# Patient Record
Sex: Male | Born: 1945 | Race: White | Hispanic: No | Marital: Married | Smoking: Former smoker
Health system: Southern US, Community
[De-identification: ages and names within clinical notes are randomized; demographics above are authoritative.]

## PROBLEM LIST (undated history)

## (undated) DIAGNOSIS — I700 Atherosclerosis of aorta: Secondary | ICD-10-CM

## (undated) DIAGNOSIS — F411 Generalized anxiety disorder: Secondary | ICD-10-CM

## (undated) DIAGNOSIS — J189 Pneumonia, unspecified organism: Secondary | ICD-10-CM

## (undated) DIAGNOSIS — M810 Age-related osteoporosis without current pathological fracture: Secondary | ICD-10-CM

## (undated) DIAGNOSIS — K219 Gastro-esophageal reflux disease without esophagitis: Secondary | ICD-10-CM

## (undated) DIAGNOSIS — E119 Type 2 diabetes mellitus without complications: Secondary | ICD-10-CM

## (undated) DIAGNOSIS — I4891 Unspecified atrial fibrillation: Secondary | ICD-10-CM

## (undated) DIAGNOSIS — R972 Elevated prostate specific antigen [PSA]: Secondary | ICD-10-CM

## (undated) DIAGNOSIS — D649 Anemia, unspecified: Secondary | ICD-10-CM

## (undated) DIAGNOSIS — I10 Essential (primary) hypertension: Secondary | ICD-10-CM

## (undated) DIAGNOSIS — M549 Dorsalgia, unspecified: Secondary | ICD-10-CM

## (undated) DIAGNOSIS — E7800 Pure hypercholesterolemia, unspecified: Secondary | ICD-10-CM

## (undated) DIAGNOSIS — R0902 Hypoxemia: Secondary | ICD-10-CM

## (undated) DIAGNOSIS — J449 Chronic obstructive pulmonary disease, unspecified: Secondary | ICD-10-CM

## (undated) DIAGNOSIS — R7301 Impaired fasting glucose: Secondary | ICD-10-CM

## (undated) HISTORY — PX: SKIN SURGERY: SHX2413

## (undated) HISTORY — DX: Unspecified atrial fibrillation: I48.91

## (undated) HISTORY — DX: Atherosclerosis of aorta: I70.0

## (undated) HISTORY — PX: CATARACT EXTRACTION: SUR2

## (undated) HISTORY — DX: Type 2 diabetes mellitus without complications: E11.9

## (undated) HISTORY — DX: Age-related osteoporosis without current pathological fracture: M81.0

## (undated) HISTORY — DX: Dorsalgia, unspecified: M54.9

## (undated) HISTORY — DX: Pure hypercholesterolemia, unspecified: E78.00

## (undated) HISTORY — DX: Essential (primary) hypertension: I10

## (undated) HISTORY — DX: Anemia, unspecified: D64.9

## (undated) HISTORY — DX: Hypoxemia: R09.02

## (undated) HISTORY — DX: Elevated prostate specific antigen (PSA): R97.20

## (undated) HISTORY — DX: Generalized anxiety disorder: F41.1

## (undated) HISTORY — DX: Chronic obstructive pulmonary disease, unspecified: J44.9

---
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
## 2004-09-17 ENCOUNTER — Inpatient Hospital Stay (HOSPITAL_COMMUNITY): Admission: AD | Admit: 2004-09-17 | Discharge: 2004-09-20 | Payer: Self-pay | Admitting: Orthopedic Surgery

## 2012-02-04 ENCOUNTER — Ambulatory Visit (HOSPITAL_BASED_OUTPATIENT_CLINIC_OR_DEPARTMENT_OTHER)
Admission: RE | Admit: 2012-02-04 | Discharge: 2012-02-04 | Disposition: A | Discharge status: Home / Self Care | Payer: Medicare Other | Source: Ambulatory Visit | Attending: Critical Care Medicine | Admitting: Critical Care Medicine

## 2012-02-04 ENCOUNTER — Ambulatory Visit (INDEPENDENT_AMBULATORY_CARE_PROVIDER_SITE_OTHER): Payer: Medicare Other | Admitting: Critical Care Medicine

## 2012-02-04 ENCOUNTER — Encounter: Payer: Self-pay | Admitting: Critical Care Medicine

## 2012-02-04 DIAGNOSIS — M949 Disorder of cartilage, unspecified: Secondary | ICD-10-CM | POA: Insufficient documentation

## 2012-02-04 DIAGNOSIS — Z87891 Personal history of nicotine dependence: Secondary | ICD-10-CM

## 2012-02-04 DIAGNOSIS — J449 Chronic obstructive pulmonary disease, unspecified: Secondary | ICD-10-CM | POA: Insufficient documentation

## 2012-02-04 DIAGNOSIS — J4489 Other specified chronic obstructive pulmonary disease: Secondary | ICD-10-CM | POA: Insufficient documentation

## 2012-02-04 DIAGNOSIS — J189 Pneumonia, unspecified organism: Secondary | ICD-10-CM

## 2012-02-04 DIAGNOSIS — M899 Disorder of bone, unspecified: Secondary | ICD-10-CM | POA: Insufficient documentation

## 2012-02-04 DIAGNOSIS — R918 Other nonspecific abnormal finding of lung field: Secondary | ICD-10-CM

## 2012-02-04 DIAGNOSIS — J9819 Other pulmonary collapse: Secondary | ICD-10-CM

## 2012-02-04 DIAGNOSIS — Z09 Encounter for follow-up examination after completed treatment for conditions other than malignant neoplasm: Secondary | ICD-10-CM | POA: Insufficient documentation

## 2012-02-04 DIAGNOSIS — E7800 Pure hypercholesterolemia, unspecified: Secondary | ICD-10-CM

## 2012-02-04 MED ORDER — TIOTROPIUM BROMIDE MONOHYDRATE 18 MCG IN CAPS: 18.0000 ug | ORAL_CAPSULE | Freq: Every day | RESPIRATORY_TRACT | Status: DC

## 2012-02-04 MED ORDER — ALBUTEROL SULFATE HFA 108 (90 BASE) MCG/ACT IN AERS: 2.0000 | INHALATION_SPRAY | RESPIRATORY_TRACT | Status: DC | PRN

## 2012-02-04 NOTE — Progress Notes (Signed)
Subjective:    Patient ID: Paul [name], male    DOB: 30-Nov-1946, 66 y.o.   MRN: [id number]  HPI Comments: Copd Dx x 47yrs.   Recently Dx PNA one week ago at [facility].  Rx ABX. Now symptoms are better  Shortness of Breath This is a chronic problem. The current episode started more than 1 year ago (three year hx, has been active in the past ). The problem occurs daily (dyspneic at rest and exertion). The problem has been gradually worsening. Associated symptoms include a fever and a sore throat. Pertinent negatives include no abdominal pain, chest pain, ear pain, headaches, hemoptysis, leg pain, leg swelling, neck pain, orthopnea, PND, rash, rhinorrhea, vomiting or wheezing. The symptoms are aggravated by any activity, eating, weather changes, URIs and smoke. Associated symptoms comments: Temp to 102 now is better . Risk factors include smoking. He has tried beta agonist inhalers and steroid inhalers for the symptoms. The treatment provided moderate relief. His past medical history is significant for COPD and pneumonia. There is no history of allergies, asthma, bronchiolitis, CAD, chronic lung disease, DVT, a heart failure or PE.   Past Medical History  Diagnosis Date  . High cholesterol   . COPD (chronic obstructive pulmonary disease)      Family History  Problem Relation Age of Onset  . Heart disease Brother      History   Social History  . Marital Status: Married    Spouse Name: N/A    Number of Children: 3  . Years of Education: N/A   Occupational History  . retired     [employer]al architect   Social History Main Topics  . Smoking status: Former Smoker -- 2.0 packs/day for 52 years    Types: Cigarettes    Quit date: 02/03/2009  . Smokeless tobacco: Never Used  . Alcohol Use: Yes  . Drug Use: No  . Sexually Active: Not on file   Other Topics Concern  . Not on file   Social History Narrative  . No narrative on file     No Known Allergies   No outpatient  prescriptions prior to visit.       Review of Systems  Constitutional: Positive for fever, chills and appetite change. Negative for diaphoresis, activity change, fatigue and unexpected weight change.  HENT: Positive for congestion and sore throat. Negative for hearing loss, ear pain, nosebleeds, facial swelling, rhinorrhea, sneezing, mouth sores, trouble swallowing, neck pain, neck stiffness, dental problem, voice change, postnasal drip, sinus pressure, tinnitus and ear discharge.   Eyes: Negative for photophobia, discharge, itching and visual disturbance.  Respiratory: Positive for cough and shortness of breath. Negative for apnea, hemoptysis, choking, chest tightness, wheezing and stridor.   Cardiovascular: Negative for chest pain, palpitations, orthopnea, leg swelling and PND.  Gastrointestinal: Positive for nausea and constipation. Negative for vomiting, abdominal pain, blood in stool and abdominal distention.  Genitourinary: Negative for dysuria, urgency, frequency, hematuria, flank pain, decreased urine volume and difficulty urinating.  Musculoskeletal: Positive for back pain. Negative for myalgias, joint swelling, arthralgias and gait problem.  Skin: Negative for color change, pallor and rash.  Neurological: Negative for dizziness, tremors, seizures, syncope, speech difficulty, weakness, light-headedness, numbness and headaches.  Hematological: Negative for adenopathy. Bruises/bleeds easily.  Psychiatric/Behavioral: Negative for confusion, sleep disturbance and agitation. The patient is not nervous/anxious.        Objective:   Physical Exam Filed Vitals:   02/04/12 1111  BP: 174/82  Pulse: 106  Temp: 98.3  F (36.8 C)  TempSrc: Oral  Height: 5\' 5"  (1.651 m)  Weight: 142 lb (64.411 kg)  SpO2: 91%    Gen: Pleasant, well-nourished, in no distress,  normal affect  ENT: No lesions,  mouth clear,  oropharynx clear, no postnasal drip  Neck: No JVD, no TMG, no carotid  bruits  Lungs: No use of accessory muscles, no dullness to percussion,  Distant BS  Cardiovascular: RRR, heart sounds normal, no murmur or gallops, no peripheral edema  Abdomen: soft and NT, no HSM,  BS normal  Musculoskeletal: No deformities, no cyanosis or clubbing  Neuro: alert, non focal  Skin: Warm, no lesions or rashes  Dg Chest 2 View  02/04/2012  *RADIOLOGY REPORT*  Clinical Data: History of pneumonia.  Follow-up.  History of COPD. History of ex-smoker.  CHEST - 2 VIEW  Comparison: 06/19/2010.  Findings: Cardiac silhouette is normal size and shape.  It is stable.  There is generalized hyperinflation configuration with flattening of the diaphragm on lateral image consistent with COPD. Patchy reticulonodular infiltrative densities are seen in the bases more on the left than the right.  There is slight blunting of each costophrenic angle left greater than right representing pleural thickening versus a small amount of pleural effusion.  There is also minimal basilar atelectasis.  Pleural effusion is favored because on lateral image there is density in the lower portion of the major fissure most likely reflecting fluid within the fissure. Apical pleural thickening and fibrotic changes bilaterally.  There is central peribronchial thickening.  There is osteopenic appearance of bones.  There is degenerative spondylosis. Mediastinal and hilar contours appear stable.  Nonaneurysmal aortic calcifications are present.  IMPRESSION: Hyperinflation consistent with COPD.  Bibasilar atelectasis and infiltrative densities more on the left than the right. This could reflect progression of chronic disease or reflect element of acute superimposed pneumonia or pneumonitis.  No consolidation is evident.  Pleural thickening versus small pleural effusions bilaterally, left greater than right. Density in major fissure on lateral image most likely reflects some fluid within the fissure.  Original Report Authenticated  By: Crawford Givens, M.D.    02/04/12 Cleda Daub: FEV1 36% FVC 57% FEF 25 75    11%      Assessment & Plan:   COPD (chronic obstructive pulmonary disease) Severe chronic obstructive lung disease golds stage III Chest xray no active disease  Plan Add Spiriva to symbicort Work on [employer] inhaler technique Finish Levaquin Overnight Sleep oxygen test  A referral to pulmonary rehab will be made Return 6 weeks for recheck [facility] office    Updated Medication List Outpatient Encounter Prescriptions as of 02/04/2012  Medication Sig Dispense Refill  . albuterol (PROVENTIL HFA;VENTOLIN HFA) 108 (90 BASE) MCG/ACT inhaler Inhale 2 puffs into the lungs every 4 (four) hours as needed for wheezing or shortness of breath.      . Ascorbic Acid (VITAMIN C PO) Take by mouth daily.      . budesonide-formoterol (SYMBICORT) 160-4.5 MCG/ACT inhaler Inhale 2 puffs into the lungs 2 (two) times daily.      . fish oil-omega-3 fatty acids 1000 MG capsule Take 2 g by mouth daily.      Marland Kitchen levofloxacin (LEVAQUIN) 750 MG tablet Daily.      . simvastatin (ZOCOR) 40 MG tablet Take 20 mg by mouth every evening.      . tiotropium (SPIRIVA HANDIHALER) 18 MCG inhalation capsule Place 1 capsule (18 mcg total) into inhaler and inhale daily.  30 capsule  6  .  DISCONTD: albuterol (PROVENTIL HFA;VENTOLIN HFA) 108 (90 BASE) MCG/ACT inhaler Inhale 2 puffs into the lungs 4 (four) times daily.      Marland Kitchen DISCONTD: tiotropium (SPIRIVA HANDIHALER) 18 MCG inhalation capsule Place 1 capsule (18 mcg total) into inhaler and inhale daily.  30 capsule  2

## 2012-02-04 NOTE — Patient Instructions (Addendum)
Add Spiriva to symbicort Work on [employer] inhaler technique Finish Levaquin A chest xray will be obtained today Overnight Sleep oxygen test  A referral to pulmonary rehab will be made Return 6 weeks for recheck [employer] office

## 2012-02-04 NOTE — Progress Notes (Signed)
Quick Note:  Called, spoke with pt.  I informed him xray stable, no pna per PW and advised to continue his current meds as prescribed at last OV. He verbalized understanding of these results and recs and voiced no further questions/concerns at this time. ______

## 2012-02-04 NOTE — Progress Notes (Signed)
Quick Note:  Notify the patient that the Xray is stable and no pneumonia No change in medications are recommended. Continue current meds as prescribed at last office visit ______ 

## 2012-02-05 NOTE — Assessment & Plan Note (Addendum)
Severe chronic obstructive lung disease golds stage III Chest xray no active disease  Plan Add Spiriva to symbicort Work on [employer] inhaler technique Finish Levaquin Overnight Sleep oxygen test  A referral to pulmonary rehab will be made Return 6 weeks for recheck [employer] office

## 2012-02-12 ENCOUNTER — Telehealth: Payer: Self-pay | Admitting: Critical Care Medicine

## 2012-02-12 DIAGNOSIS — J449 Chronic obstructive pulmonary disease, unspecified: Secondary | ICD-10-CM

## 2012-02-12 NOTE — Telephone Encounter (Signed)
Pt needs QHS oxygen Order sent to [facility]

## 2012-02-12 NOTE — Telephone Encounter (Signed)
I have placed order please advise [facility] thanks

## 2012-02-12 NOTE — Telephone Encounter (Signed)
Needed liter flow. Libby spoke with Dr. Delford Field who advised to place o2 on 2 lpm q hs. Referral faxed to [facility] to provide home oxygen at 2 lpm Q HS.

## 2012-02-12 NOTE — Telephone Encounter (Signed)
Need order for 02 put into [facility] que thanks

## 2012-02-21 ENCOUNTER — Encounter: Payer: Self-pay | Admitting: Critical Care Medicine

## 2012-03-16 ENCOUNTER — Ambulatory Visit: Payer: Medicare Other | Admitting: Critical Care Medicine

## 2012-03-19 ENCOUNTER — Encounter: Payer: Self-pay | Admitting: Critical Care Medicine

## 2012-03-19 ENCOUNTER — Ambulatory Visit (INDEPENDENT_AMBULATORY_CARE_PROVIDER_SITE_OTHER): Payer: Medicare Other | Admitting: Critical Care Medicine

## 2012-03-19 VITALS — BP 146/80 | HR 86 | Temp 98.2°F | Ht 65.0 in | Wt 142.5 lb

## 2012-03-19 DIAGNOSIS — J449 Chronic obstructive pulmonary disease, unspecified: Secondary | ICD-10-CM

## 2012-03-19 MED ORDER — TIOTROPIUM BROMIDE MONOHYDRATE 18 MCG IN CAPS: 18.0000 ug | ORAL_CAPSULE | Freq: Every day | RESPIRATORY_TRACT | Status: DC

## 2012-03-19 NOTE — Progress Notes (Signed)
Subjective:    Patient ID: Paul [name], male    DOB: 1946-12-18, 66 y.o.   MRN: [id number]  HPI Comments: Copd Dx x 38yrs.   Recently Dx PNA one week ago at [facility].  Rx ABX. Now symptoms are better  03/19/2012 Doing better  no new issues. Patient wearing oxygen at night. Pt denies any significant sore throat, nasal congestion or excess secretions, fever, chills, sweats, unintended weight loss, pleurtic or exertional chest pain, orthopnea PND, or leg swelling Pt denies any increase in rescue therapy over baseline, denies waking up needing it or having any early am or nocturnal exacerbations of coughing/wheezing/or dyspnea. Pt also denies any obvious fluctuation in symptoms with  weather or environmental change or other alleviating or aggravating factors     Past Medical History  Diagnosis Date  . High cholesterol   . COPD (chronic obstructive pulmonary disease)      Family History  Problem Relation Age of Onset  . Heart disease Brother      History   Social History  . Marital Status: Married    Spouse Name: N/A    Number of Children: 3  . Years of Education: N/A   Occupational History  . retired     [employer] architect   Social History Main Topics  . Smoking status: Former Smoker -- 2.0 packs/day for 52 years    Types: Cigarettes    Quit date: 02/03/2009  . Smokeless tobacco: Never Used  . Alcohol Use: Yes  . Drug Use: No  . Sexually Active: Not on file   Other Topics Concern  . Not on file   Social History Narrative  . No narrative on file     No Known Allergies   Outpatient Prescriptions Prior to Visit  Medication Sig Dispense Refill  . albuterol (PROVENTIL HFA;VENTOLIN HFA) 108 (90 BASE) MCG/ACT inhaler Inhale 2 puffs into the lungs every 4 (four) hours as needed for wheezing or shortness of breath.      . Ascorbic Acid (VITAMIN C PO) Take by mouth daily.      . budesonide-formoterol (SYMBICORT) 160-4.5 MCG/ACT inhaler Inhale 2 puffs into the  lungs 2 (two) times daily.      . fish oil-omega-3 fatty acids 1000 MG capsule Take 2 g by mouth daily.      . simvastatin (ZOCOR) 40 MG tablet Take 20 mg by mouth every evening.      . tiotropium (SPIRIVA HANDIHALER) 18 MCG inhalation capsule Place 1 capsule (18 mcg total) into inhaler and inhale daily.  30 capsule  6       Review of Systems  Constitutional: Positive for chills and appetite change. Negative for diaphoresis, activity change, fatigue and unexpected weight change.  HENT: Positive for congestion. Negative for hearing loss, nosebleeds, facial swelling, sneezing, mouth sores, trouble swallowing, neck stiffness, dental problem, voice change, postnasal drip, sinus pressure, tinnitus and ear discharge.   Eyes: Negative for photophobia, discharge, itching and visual disturbance.  Respiratory: Positive for cough. Negative for apnea, choking, chest tightness and stridor.   Cardiovascular: Negative for palpitations.  Gastrointestinal: Positive for nausea and constipation. Negative for blood in stool and abdominal distention.  Genitourinary: Negative for dysuria, urgency, frequency, hematuria, flank pain, decreased urine volume and difficulty urinating.  Musculoskeletal: Positive for back pain. Negative for myalgias, joint swelling, arthralgias and gait problem.  Skin: Negative for color change and pallor.  Neurological: Negative for dizziness, tremors, seizures, syncope, speech difficulty, weakness, light-headedness and numbness.  Hematological:  Negative for adenopathy. Bruises/bleeds easily.  Psychiatric/Behavioral: Negative for confusion, sleep disturbance and agitation. The patient is not nervous/anxious.        Objective:   Physical Exam  Filed Vitals:   03/19/12 1353  BP: 146/80  Pulse: 86  Temp: 98.2 F (36.8 C)  TempSrc: Oral  Height: 5\' 5"  (1.651 m)  Weight: 142 lb 8 oz (64.638 kg)  SpO2: 95%    Gen: Pleasant, well-nourished, in no distress,  normal affect  ENT:  No lesions,  mouth clear,  oropharynx clear, no postnasal drip  Neck: No JVD, no TMG, no carotid bruits  Lungs: No use of accessory muscles, no dullness to percussion,  Distant BS  Cardiovascular: RRR, heart sounds normal, no murmur or gallops, no peripheral edema  Abdomen: soft and NT, no HSM,  BS normal  Musculoskeletal: No deformities, no cyanosis or clubbing  Neuro: alert, non focal  Skin: Warm, no lesions or rashes  No results found.  02/04/12 Spiro: FEV1 36% FVC 57% FEF 25 75    11%      Assessment & Plan:   COPD (chronic obstructive pulmonary disease) Severe chronic obstructive lung disease golds stage III   improved with Spiriva therapy Plan Continue oxygen at night Continue Advair and Spiriva Return 4 months     Updated Medication List Outpatient Encounter Prescriptions as of 03/19/2012  Medication Sig Dispense Refill  . albuterol (PROVENTIL HFA;VENTOLIN HFA) 108 (90 BASE) MCG/ACT inhaler Inhale 2 puffs into the lungs every 4 (four) hours as needed for wheezing or shortness of breath.      . Ascorbic Acid (VITAMIN C PO) Take by mouth daily.      . budesonide-formoterol (SYMBICORT) 160-4.5 MCG/ACT inhaler Inhale 2 puffs into the lungs 2 (two) times daily.      . fish oil-omega-3 fatty acids 1000 MG capsule Take 2 g by mouth daily.      . simvastatin (ZOCOR) 40 MG tablet Take 20 mg by mouth every evening.      . tiotropium (SPIRIVA HANDIHALER) 18 MCG inhalation capsule Place 1 capsule (18 mcg total) into inhaler and inhale daily.  90 capsule  6  . DISCONTD: tiotropium (SPIRIVA HANDIHALER) 18 MCG inhalation capsule Place 1 capsule (18 mcg total) into inhaler and inhale daily.  30 capsule  6

## 2012-03-19 NOTE — Patient Instructions (Signed)
No change in medications. Return in         4 months 

## 2012-03-19 NOTE — Assessment & Plan Note (Addendum)
Severe chronic obstructive lung disease golds stage III   improved with Spiriva therapy Plan Continue oxygen at night Continue Advair and Spiriva Return 4 months

## 2012-07-23 ENCOUNTER — Ambulatory Visit (INDEPENDENT_AMBULATORY_CARE_PROVIDER_SITE_OTHER): Payer: Medicare Other | Admitting: Critical Care Medicine

## 2012-07-23 ENCOUNTER — Encounter: Payer: Self-pay | Admitting: Critical Care Medicine

## 2012-07-23 VITALS — BP 122/66 | HR 80 | Temp 98.5°F | Ht 66.0 in | Wt 148.0 lb

## 2012-07-23 DIAGNOSIS — J449 Chronic obstructive pulmonary disease, unspecified: Secondary | ICD-10-CM

## 2012-07-23 NOTE — Progress Notes (Signed)
Subjective:    Patient ID: Paul [name], male    DOB: 04-Oct-1946, 66 y.o.   MRN: [id number]  HPI 03/19/2012 Doing better  no new issues. Patient wearing oxygen at night. Pt denies any significant sore throat, nasal congestion or excess secretions, fever, chills, sweats, unintended weight loss, pleurtic or exertional chest pain, orthopnea PND, or leg swelling Pt denies any increase in rescue therapy over baseline, denies waking up needing it or having any early am or nocturnal exacerbations of coughing/wheezing/or dyspnea. Pt also denies any obvious fluctuation in symptoms with  weather or environmental change or other alleviating or aggravating factors  07/23/2012 On symbicort and spiriva and doing well.  Min cough. Dyspnea is stable Pt denies any significant sore throat, nasal congestion or excess secretions, fever, chills, sweats, unintended weight loss, pleurtic or exertional chest pain, orthopnea PND, or leg swelling Pt denies any increase in rescue therapy over baseline, denies waking up needing it or having any early am or nocturnal exacerbations of coughing/wheezing/or dyspnea. Pt also denies any obvious fluctuation in symptoms with  weather or environmental change or other alleviating or aggravating factors     Past Medical History  Diagnosis Date  . High cholesterol   . COPD (chronic obstructive pulmonary disease)      Family History  Problem Relation Age of Onset  . Heart disease Brother      History   Social History  . Marital Status: Married    Spouse Name: N/A    Number of Children: 3  . Years of Education: N/A   Occupational History  . retired     [employer] architect   Social History Main Topics  . Smoking status: Former Smoker -- 2.0 packs/day for 52 years    Types: Cigarettes    Quit date: 02/03/2009  . Smokeless tobacco: Never Used  . Alcohol Use: Yes  . Drug Use: No  . Sexually Active: Not on file   Other Topics Concern  . Not on file   Social History  Narrative  . No narrative on file     No Known Allergies   Outpatient Prescriptions Prior to Visit  Medication Sig Dispense Refill  . albuterol (PROVENTIL HFA;VENTOLIN HFA) 108 (90 BASE) MCG/ACT inhaler Inhale 2 puffs into the lungs every 4 (four) hours as needed for wheezing or shortness of breath.      . Ascorbic Acid (VITAMIN C PO) Take by mouth daily.      . budesonide-formoterol (SYMBICORT) 160-4.5 MCG/ACT inhaler Inhale 2 puffs into the lungs 2 (two) times daily.      . fish oil-omega-3 fatty acids 1000 MG capsule Take 2 g by mouth daily.      . simvastatin (ZOCOR) 40 MG tablet Take 20 mg by mouth every evening.      . tiotropium (SPIRIVA HANDIHALER) 18 MCG inhalation capsule Place 1 capsule (18 mcg total) into inhaler and inhale daily.  90 capsule  6       Review of Systems  Constitutional: Positive for chills and appetite change. Negative for diaphoresis, activity change, fatigue and unexpected weight change.  HENT: Positive for congestion. Negative for hearing loss, nosebleeds, facial swelling, sneezing, mouth sores, trouble swallowing, neck stiffness, dental problem, voice change, postnasal drip, sinus pressure, tinnitus and ear discharge.   Eyes: Negative for photophobia, discharge, itching and visual disturbance.  Respiratory: Positive for cough. Negative for apnea, choking, chest tightness and stridor.   Cardiovascular: Negative for palpitations.  Gastrointestinal: Positive for nausea and constipation. Negative  for blood in stool and abdominal distention.  Genitourinary: Negative for dysuria, urgency, frequency, hematuria, flank pain, decreased urine volume and difficulty urinating.  Musculoskeletal: Positive for back pain. Negative for myalgias, joint swelling, arthralgias and gait problem.  Skin: Negative for color change and pallor.  Neurological: Negative for dizziness, tremors, seizures, syncope, speech difficulty, weakness, light-headedness and numbness.    Hematological: Negative for adenopathy. Bruises/bleeds easily.  Psychiatric/Behavioral: Negative for confusion, disturbed wake/sleep cycle and agitation. The patient is not nervous/anxious.        Objective:   Physical Exam  Filed Vitals:   07/23/12 1438  BP: 122/66  Pulse: 80  Temp: 98.5 F (36.9 C)  TempSrc: Oral  Height: 5\' 6"  (1.676 m)  Weight: 148 lb (67.132 kg)  SpO2: 96%    Gen: Pleasant, well-nourished, in no distress,  normal affect  ENT: No lesions,  mouth clear,  oropharynx clear, no postnasal drip  Neck: No JVD, no TMG, no carotid bruits  Lungs: No use of accessory muscles, no dullness to percussion,  Distant BS  Cardiovascular: RRR, heart sounds normal, no murmur or gallops, no peripheral edema  Abdomen: soft and NT, no HSM,  BS normal  Musculoskeletal: No deformities, no cyanosis or clubbing  Neuro: alert, non focal  Skin: Warm, no lesions or rashes  No results found.  02/04/12 Spiro: FEV1 36% FVC 57% FEF 25 75    11%      Assessment & Plan:   COPD (chronic obstructive pulmonary disease) Chronic obstructive lung disease gold stage C. COPD stable at this time Plan Maintain inhaled medications Spiriva and Symbicort as prescribed Maintain oxygen 2 L at bedtime Return 6    Updated Medication List Outpatient Encounter Prescriptions as of 07/23/2012  Medication Sig Dispense Refill  . albuterol (PROVENTIL HFA;VENTOLIN HFA) 108 (90 BASE) MCG/ACT inhaler Inhale 2 puffs into the lungs every 4 (four) hours as needed for wheezing or shortness of breath.      . Ascorbic Acid (VITAMIN C PO) Take by mouth daily.      . budesonide-formoterol (SYMBICORT) 160-4.5 MCG/ACT inhaler Inhale 2 puffs into the lungs 2 (two) times daily.      . fish oil-omega-3 fatty acids 1000 MG capsule Take 2 g by mouth daily.      . Multiple Vitamins-Minerals (MULTIVITAMIN PO) Take by mouth daily.      . simvastatin (ZOCOR) 40 MG tablet Take 20 mg by mouth every evening.      .  tiotropium (SPIRIVA HANDIHALER) 18 MCG inhalation capsule Place 1 capsule (18 mcg total) into inhaler and inhale daily.  90 capsule  6

## 2012-07-23 NOTE — Assessment & Plan Note (Signed)
Chronic obstructive lung disease gold stage C. COPD stable at this time Plan Maintain inhaled medications Spiriva and Symbicort as prescribed Maintain oxygen 2 L at bedtime Return 6

## 2012-07-23 NOTE — Patient Instructions (Signed)
No change in medications. Return in        6 months        

## 2013-01-07 ENCOUNTER — Encounter: Payer: Self-pay | Admitting: Critical Care Medicine

## 2013-01-07 ENCOUNTER — Ambulatory Visit (INDEPENDENT_AMBULATORY_CARE_PROVIDER_SITE_OTHER): Payer: Medicare Other | Admitting: Critical Care Medicine

## 2013-01-07 VITALS — BP 130/80 | HR 80 | Temp 98.0°F | Wt 145.0 lb

## 2013-01-07 DIAGNOSIS — J438 Other emphysema: Secondary | ICD-10-CM

## 2013-01-07 DIAGNOSIS — J449 Chronic obstructive pulmonary disease, unspecified: Secondary | ICD-10-CM

## 2013-01-07 DIAGNOSIS — J439 Emphysema, unspecified: Secondary | ICD-10-CM

## 2013-01-07 MED ORDER — TIOTROPIUM BROMIDE MONOHYDRATE 18 MCG IN CAPS: 18.0000 ug | ORAL_CAPSULE | Freq: Every day | RESPIRATORY_TRACT | Status: DC

## 2013-01-07 MED ORDER — BUDESONIDE-FORMOTEROL FUMARATE 160-4.5 MCG/ACT IN AERO: 2.0000 | INHALATION_SPRAY | Freq: Two times a day (BID) | RESPIRATORY_TRACT | Status: DC

## 2013-01-07 NOTE — Progress Notes (Signed)
Subjective:    Patient ID: Paul [name], male    DOB: October 23, 1946, 67 y.o.   MRN: [id number]  HPI  03/19/2012 Doing better  no new issues. Patient wearing oxygen at night. Pt denies any significant sore throat, nasal congestion or excess secretions, fever, chills, sweats, unintended weight loss, pleurtic or exertional chest pain, orthopnea PND, or leg swelling Pt denies any increase in rescue therapy over baseline, denies waking up needing it or having any early am or nocturnal exacerbations of coughing/wheezing/or dyspnea. Pt also denies any obvious fluctuation in symptoms with  weather or environmental change or other alleviating or aggravating factors  07/23/12 On symbicort and spiriva and doing well.  Min cough. Dyspnea is stable Pt denies any significant sore throat, nasal congestion or excess secretions, fever, chills, sweats, unintended weight loss, pleurtic or exertional chest pain, orthopnea PND, or leg swelling Pt denies any increase in rescue therapy over baseline, denies waking up needing it or having any early am or nocturnal exacerbations of coughing/wheezing/or dyspnea. Pt also denies any obvious fluctuation in symptoms with  weather or environmental change or other alleviating or aggravating factors  01/07/2013 No real changes.  Dyspnea ok.  No qhs dyspnea.  Pt can walk and play golf.  N oreal chest pain Min mucus Pt denies any significant sore throat, nasal congestion or excess secretions, fever, chills, sweats, unintended weight loss, pleurtic or exertional chest pain, orthopnea PND, or leg swelling Pt denies any increase in rescue therapy over baseline, denies waking up needing it or having any early am or nocturnal exacerbations of coughing/wheezing/or dyspnea. Pt also denies any obvious fluctuation in symptoms with  weather or environmental change or other alleviating or aggravating factors      Past Medical History  Diagnosis Date  . High cholesterol   . COPD (chronic  obstructive pulmonary disease)      Family History  Problem Relation Age of Onset  . Heart disease Brother      History   Social History  . Marital Status: Married    Spouse Name: N/A    Number of Children: 3  . Years of Education: N/A   Occupational History  . retired     [employer]al architect   Social History Main Topics  . Smoking status: Former Smoker -- 2.0 packs/day for 52 years    Types: Cigarettes    Quit date: 02/03/2009  . Smokeless tobacco: Never Used  . Alcohol Use: Yes  . Drug Use: No  . Sexually Active: Not on file   Other Topics Concern  . Not on file   Social History Narrative  . No narrative on file     No Known Allergies   Outpatient Prescriptions Prior to Visit  Medication Sig Dispense Refill  . albuterol (PROVENTIL HFA;VENTOLIN HFA) 108 (90 BASE) MCG/ACT inhaler Inhale 2 puffs into the lungs every 4 (four) hours as needed for wheezing or shortness of breath.      . Ascorbic Acid (VITAMIN C PO) Take by mouth daily.      . fish oil-omega-3 fatty acids 1000 MG capsule Take 2 g by mouth daily.      . Multiple Vitamins-Minerals (MULTIVITAMIN PO) Take by mouth daily.      . simvastatin (ZOCOR) 40 MG tablet Take 20 mg by mouth every evening.      . [DISCONTINUED] budesonide-formoterol (SYMBICORT) 160-4.5 MCG/ACT inhaler Inhale 2 puffs into the lungs 2 (two) times daily.      . [DISCONTINUED] tiotropium (SPIRIVA HANDIHALER)  18 MCG inhalation capsule Place 1 capsule (18 mcg total) into inhaler and inhale daily.  90 capsule  6  Last reviewed on 01/07/2013 11:18 AM by Storm Frisk, MD     Review of Systems  Constitutional: Positive for chills and appetite change. Negative for diaphoresis, activity change, fatigue and unexpected weight change.  HENT: Positive for congestion. Negative for hearing loss, nosebleeds, facial swelling, sneezing, mouth sores, trouble swallowing, neck stiffness, dental problem, voice change, postnasal drip, sinus pressure, tinnitus and  ear discharge.   Eyes: Negative for photophobia, discharge, itching and visual disturbance.  Respiratory: Positive for cough. Negative for apnea, choking, chest tightness and stridor.   Cardiovascular: Negative for palpitations.  Gastrointestinal: Positive for nausea and constipation. Negative for blood in stool and abdominal distention.  Genitourinary: Negative for dysuria, urgency, frequency, hematuria, flank pain, decreased urine volume and difficulty urinating.  Musculoskeletal: Positive for back pain. Negative for myalgias, joint swelling, arthralgias and gait problem.  Skin: Negative for color change and pallor.  Neurological: Negative for dizziness, tremors, seizures, syncope, speech difficulty, weakness, light-headedness and numbness.  Hematological: Negative for adenopathy. Bruises/bleeds easily.  Psychiatric/Behavioral: Negative for confusion, sleep disturbance and agitation. The patient is not nervous/anxious.        Objective:   Physical Exam  Filed Vitals:   01/07/13 1100  BP: 130/80  Pulse: 80  Temp: 98 F (36.7 C)  Weight: 145 lb (65.772 kg)  SpO2: 95%    Gen: Pleasant, well-nourished, in no distress,  normal affect  ENT: No lesions,  mouth clear,  oropharynx clear, no postnasal drip  Neck: No JVD, no TMG, no carotid bruits  Lungs: No use of accessory muscles, no dullness to percussion,  Distant BS  Cardiovascular: RRR, heart sounds normal, no murmur or gallops, no peripheral edema  Abdomen: soft and NT, no HSM,  BS normal  Musculoskeletal: No deformities, no cyanosis or clubbing  Neuro: alert, non focal  Skin: Warm, no lesions or rashes  No results found.  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 COPD stable at this time The patient may now improved enough that we'll he will no longer need nocturnal oxygen therapy Plan Recheck overnight sleep oximetry  on room air Maintain inhaled medications as prescribed    Updated Medication List Outpatient Encounter Prescriptions as of 01/07/2013  Medication Sig Dispense Refill  . albuterol (PROVENTIL HFA;VENTOLIN HFA) 108 (90 BASE) MCG/ACT inhaler Inhale 2 puffs into the lungs every 4 (four) hours as needed for wheezing or shortness of breath.      . Ascorbic Acid (VITAMIN C PO) Take by mouth daily.      . budesonide-formoterol (SYMBICORT) 160-4.5 MCG/ACT inhaler Inhale 2 puffs into the lungs 2 (two) times daily.  2 Inhaler  0  . fish oil-omega-3 fatty acids 1000 MG capsule Take 2 g by mouth daily.      . Multiple Vitamins-Minerals (MULTIVITAMIN PO) Take by mouth daily.      . simvastatin (ZOCOR) 40 MG tablet Take 20 mg by mouth every evening.      . tiotropium (SPIRIVA HANDIHALER) 18 MCG inhalation capsule Place 1 capsule (18 mcg total) into inhaler and inhale daily.  20 capsule  0  . [DISCONTINUED] budesonide-formoterol (SYMBICORT) 160-4.5 MCG/ACT inhaler Inhale 2 puffs into the lungs 2 (two) times daily.      . [DISCONTINUED] tiotropium (SPIRIVA  HANDIHALER) 18 MCG inhalation capsule Place 1 capsule (18 mcg total) into inhaler and inhale daily.  90 capsule  6

## 2013-01-07 NOTE — Patient Instructions (Addendum)
No change in inhalers An overnight sleep oxygen test will be obtained on Room air at your convenience.  Return 6 months

## 2013-01-08 NOTE — Assessment & Plan Note (Signed)
Gold stage C. COPD stable at this time The patient may now improved enough that we'll he will no longer need nocturnal oxygen therapy Plan Recheck overnight sleep oximetry on room air Maintain inhaled medications as prescribed

## 2013-01-27 ENCOUNTER — Encounter: Payer: Self-pay | Admitting: Pulmonary Disease

## 2013-01-27 ENCOUNTER — Ambulatory Visit (INDEPENDENT_AMBULATORY_CARE_PROVIDER_SITE_OTHER): Payer: Medicare Other | Admitting: Pulmonary Disease

## 2013-01-27 ENCOUNTER — Telehealth: Payer: Self-pay | Admitting: Critical Care Medicine

## 2013-01-27 VITALS — BP 120/72 | HR 94 | Temp 98.3°F | Ht 66.0 in | Wt 143.8 lb

## 2013-01-27 DIAGNOSIS — J449 Chronic obstructive pulmonary disease, unspecified: Secondary | ICD-10-CM

## 2013-01-27 DIAGNOSIS — J9611 Chronic respiratory failure with hypoxia: Secondary | ICD-10-CM

## 2013-01-27 DIAGNOSIS — J441 Chronic obstructive pulmonary disease with (acute) exacerbation: Secondary | ICD-10-CM | POA: Insufficient documentation

## 2013-01-27 MED ORDER — PREDNISONE 10 MG PO TABS: ORAL_TABLET | ORAL | Status: DC

## 2013-01-27 NOTE — Progress Notes (Signed)
  Subjective:    Patient ID: Paul [name], male    DOB: [birth year], 67 y.o.   MRN: [id number]  HPI Patient comes in today for an acute sick visit.  He has known COPD, and recently returned from a trip to [place].  While down there, he developed cough with increased congestion and small quantities of green mucus production.  He also had increasing shortness of breath and a low-grade fever.  Once he returned, he was treated by his primary physician with a course of doxycycline.  This resulted in decreased quantity of mucus, but he has a residual cough and his shortness of breath has not improved.   Review of Systems  Constitutional: Negative for fever and unexpected weight change.  HENT: Negative for ear pain, nosebleeds, congestion, sore throat, rhinorrhea, sneezing, trouble swallowing, dental problem, postnasal drip and sinus pressure.   Eyes: Negative for redness and itching.  Respiratory: Positive for cough and wheezing. Negative for chest tightness and shortness of breath.   Cardiovascular: Negative for palpitations and leg swelling.  Gastrointestinal: Negative for nausea and vomiting.  Genitourinary: Negative for dysuria.  Musculoskeletal: Negative for joint swelling.  Skin: Negative for rash.  Neurological: Negative for headaches.  Hematological: Does not bruise/bleed easily.  Psychiatric/Behavioral: Negative for dysphoric mood. The patient is not nervous/anxious.        Objective:   Physical Exam Well-developed male in no acute distress Nose without purulent discharge noted Oropharynx clear Neck without lymphadenopathy or thyromegaly Chest with a few basilar crackles, mild decrease in breath sounds, no active wheezing Cardiac exam with regular rate and rhythm Lower extremities without edema, cyanosis Alert and oriented, moves all 4 extremities.       Assessment & Plan:

## 2013-01-27 NOTE — Assessment & Plan Note (Signed)
I suspect the patient has a mild COPD exacerbation related to an episode of acute bronchitis.  He has been treated with a course of doxycycline with improvement in his because production, but his cough and shortness of breath have persisted.  I think he would benefit from a short course of prednisone to help with airway inflammation, and he is to let us know if he is not improving.

## 2013-01-27 NOTE — Telephone Encounter (Signed)
Call the pt and tell him his ONO is positive for desaturation to 83% on RA.  He had of low sats   He needs to stay on oxygen 2L QHS

## 2013-01-27 NOTE — Patient Instructions (Addendum)
No change in your breathing medications. Will treat with a prednisone taper to see if helps your cough and shortness of breath.  Keep followup with Dr. Delford Field, and call if you are not getting better.

## 2013-01-28 ENCOUNTER — Telehealth: Payer: Self-pay | Admitting: Pulmonary Disease

## 2013-01-28 MED ORDER — PREDNISONE 10 MG PO TABS: ORAL_TABLET | ORAL | Status: DC

## 2013-01-28 NOTE — Telephone Encounter (Signed)
Looks like prednisone was sent to [facility]. I have called cancelled this RX. I have sent new RX to [facility] for pt. [id number] x1

## 2013-01-28 NOTE — Telephone Encounter (Signed)
I spoke with spouse and is aware RX has been cancelled at [facility] and new rx sent into [facility]. Nothing further was needed

## 2013-01-28 NOTE — Telephone Encounter (Signed)
Called, spoke with pt.  Informed him of ONO on RA results and recs per Dr. Delford Field.  He verbalized understanding of results and to stay on o2 at 2 lpm qhs and voiced no further questions or concerns at this time.

## 2013-02-10 ENCOUNTER — Encounter: Payer: Self-pay | Admitting: Critical Care Medicine

## 2013-07-15 ENCOUNTER — Encounter: Payer: Self-pay | Admitting: Critical Care Medicine

## 2013-07-15 ENCOUNTER — Ambulatory Visit (INDEPENDENT_AMBULATORY_CARE_PROVIDER_SITE_OTHER): Payer: BC Managed Care – HMO | Admitting: Critical Care Medicine

## 2013-07-15 VITALS — BP 122/58 | HR 74 | Temp 97.5°F | Ht 66.0 in | Wt 143.0 lb

## 2013-07-15 DIAGNOSIS — J449 Chronic obstructive pulmonary disease, unspecified: Secondary | ICD-10-CM

## 2013-07-15 MED ORDER — BUDESONIDE-FORMOTEROL FUMARATE 160-4.5 MCG/ACT IN AERO: 2.0000 | INHALATION_SPRAY | Freq: Two times a day (BID) | RESPIRATORY_TRACT | Status: DC

## 2013-07-15 MED ORDER — TIOTROPIUM BROMIDE MONOHYDRATE 18 MCG IN CAPS: 18.0000 ug | ORAL_CAPSULE | Freq: Every day | RESPIRATORY_TRACT | Status: DC

## 2013-07-15 NOTE — Assessment & Plan Note (Signed)
Gold stage C. COPD improved lung function compared to 2013 Plan Maintain Spiriva and Symbicort

## 2013-07-15 NOTE — Patient Instructions (Signed)
No change in medications. Return in        6 months        

## 2013-07-15 NOTE — Progress Notes (Signed)
Subjective:    Patient ID: Paul [name], male    DOB: [birth year], 67 y.o.   MRN: [id number]  HPI F/U visit. Has good breathing. One flare in the last 6 month around [place], patient inhaled [place] dust and had a flare that required steroids and abx. Occational cough with white sputum, but patient denies chest tightness, wheezing, rhinorrhea, PND. Not limited in activities. Uses 2 L of oxygen at night.   Review of Systems  Constitutional: Negative for fever, chills and fatigue.  HENT: Negative for rhinorrhea, sneezing and sinus pressure.   Respiratory: Positive for cough and shortness of breath. Negative for chest tightness and wheezing.        Objective:   Physical Exam BP 122/58  Pulse 74  Temp(Src) 97.5 F (36.4 C) (Oral)  Ht 5\' 6"  (1.676 m)  Wt 143 lb (64.864 kg)  BMI 23.09 kg/m2  SpO2 95%  HEENT: Head is normocephalic and atraumatic. Extraocular muscles are intact. Pupils are equal, round, and reactive to light. Nares appeared normal. Mouth is well hydrated and without lesions. Mucous membranes are moist. Posterior pharynx clear of any exudate or lesions. NECK: Supple. No carotid bruits. No lymphadenopathy or thyromegaly. LUNGS: Clear to auscultation. HEART: Regular rate and rhythm without murmur.  ABDOMEN: Soft, nontender, and nondistended. Positive bowel sounds. No hepatosplenomegaly was noted.  EXTREMITIES: Without any cyanosis, clubbing, rash, lesions or edema.  NEUROLOGIC: Cranial nerves II through XII are grossly intact.  PSYCHIATRIC: Flat affect, but denies suicidal or homicidal ideations. SKIN: No ulceration or induration present.        Assessment & Plan:   COPD (chronic obstructive pulmonary disease), gold stage C. Gold stage C. COPD improved lung function compared to 2013 Plan Maintain Spiriva and Symbicort    Updated Medication List Outpatient Encounter Prescriptions as of 07/15/2013  Medication Sig Dispense Refill  . albuterol  (PROVENTIL HFA;VENTOLIN HFA) 108 (90 BASE) MCG/ACT inhaler Inhale 2 puffs into the lungs every 4 (four) hours as needed for wheezing or shortness of breath.      . Ascorbic Acid (VITAMIN C PO) Take by mouth daily.      . budesonide-formoterol (SYMBICORT) 160-4.5 MCG/ACT inhaler Inhale 2 puffs into the lungs 2 (two) times daily.  3 Inhaler  4  . fish oil-omega-3 fatty acids 1000 MG capsule Take 2 g by mouth daily.      . Multiple Vitamins-Minerals (MULTIVITAMIN PO) Take by mouth daily.      . simvastatin (ZOCOR) 40 MG tablet Take 20 mg by mouth every evening.      . tiotropium (SPIRIVA HANDIHALER) 18 MCG inhalation capsule Place 1 capsule (18 mcg total) into inhaler and inhale daily.  90 capsule  4  . [DISCONTINUED] budesonide-formoterol (SYMBICORT) 160-4.5 MCG/ACT inhaler Inhale 2 puffs into the lungs 2 (two) times daily.  2 Inhaler  0  . [DISCONTINUED] tiotropium (SPIRIVA HANDIHALER) 18 MCG inhalation capsule Place 1 capsule (18 mcg total) into inhaler and inhale daily.  20 capsule  0  . [DISCONTINUED] doxycycline (VIBRAMYCIN) 100 MG capsule Take 100 mg by mouth 2 (two) times daily.      . [DISCONTINUED] Phenyleph-Promethazine-Cod 5-6.25-10 MG/5ML SYRP Take 5 mLs by mouth at bedtime as needed.      . [DISCONTINUED] predniSONE (DELTASONE) 10 MG tablet Take 4 tabs for 2 days, 3 tabs for 2 days, 2 tabs for 2 days, 1 tab for 2 days then stop  20 tablet  0   No facility-administered encounter medications on file  as of 07/15/2013.

## 2013-12-20 DIAGNOSIS — Z1211 Encounter for screening for malignant neoplasm of colon: Secondary | ICD-10-CM | POA: Diagnosis not present

## 2014-01-03 DIAGNOSIS — Z79899 Other long term (current) drug therapy: Secondary | ICD-10-CM | POA: Diagnosis not present

## 2014-01-03 DIAGNOSIS — N390 Urinary tract infection, site not specified: Secondary | ICD-10-CM | POA: Diagnosis not present

## 2014-01-03 DIAGNOSIS — R7309 Other abnormal glucose: Secondary | ICD-10-CM | POA: Diagnosis not present

## 2014-01-03 DIAGNOSIS — R509 Fever, unspecified: Secondary | ICD-10-CM | POA: Diagnosis not present

## 2014-01-07 DIAGNOSIS — R7309 Other abnormal glucose: Secondary | ICD-10-CM | POA: Diagnosis not present

## 2014-01-07 DIAGNOSIS — J449 Chronic obstructive pulmonary disease, unspecified: Secondary | ICD-10-CM | POA: Diagnosis not present

## 2014-01-07 DIAGNOSIS — E7800 Pure hypercholesterolemia, unspecified: Secondary | ICD-10-CM | POA: Diagnosis not present

## 2014-01-11 ENCOUNTER — Ambulatory Visit (INDEPENDENT_AMBULATORY_CARE_PROVIDER_SITE_OTHER): Payer: Medicare Other | Admitting: Critical Care Medicine

## 2014-01-11 ENCOUNTER — Encounter: Payer: Self-pay | Admitting: Critical Care Medicine

## 2014-01-11 VITALS — BP 116/62 | HR 77 | Temp 98.2°F | Ht 66.0 in | Wt 142.0 lb

## 2014-01-11 DIAGNOSIS — J449 Chronic obstructive pulmonary disease, unspecified: Secondary | ICD-10-CM | POA: Diagnosis not present

## 2014-01-11 DIAGNOSIS — Z23 Encounter for immunization: Secondary | ICD-10-CM | POA: Diagnosis not present

## 2014-01-11 DIAGNOSIS — J4489 Other specified chronic obstructive pulmonary disease: Secondary | ICD-10-CM

## 2014-01-11 NOTE — Progress Notes (Signed)
  Subjective:    Patient ID: Paul [name], male    DOB: 15-Feb-1946, 68 y.o.   MRN: [id number]  HPI    01/11/2014 Chief Complaint  Patient presents with  . 6 month follow up    Coughing a little more with yellow mucus.  No SOB, wheezing, chest tightness/pain, or f/c/s.   Cough is more over the past 6 months.  Yellow mucus noted.  Cannot raise. No new medical issues.  Exercises daily in gym independently Pt denies any significant sore throat, nasal congestion or excess secretions, fever, chills, sweats, unintended weight loss, pleurtic or exertional chest pain, orthopnea PND, or leg swelling Pt denies any increase in rescue therapy over baseline, denies waking up needing it or having any early am or nocturnal exacerbations of coughing/wheezing/or dyspnea. Pt also denies any obvious fluctuation in symptoms with  weather or environmental change or other alleviating or aggravating factors    Review of Systems  Constitutional: Negative for fever, chills and fatigue.  HENT: Negative for rhinorrhea, sinus pressure and sneezing.   Respiratory: Positive for cough and shortness of breath. Negative for chest tightness and wheezing.        Objective:   Physical Exam  BP 116/62  Pulse 77  Temp(Src) 98.2 F (36.8 C) (Oral)  Ht 5\' 6"  (1.676 m)  Wt 142 lb (64.411 kg)  BMI 22.93 kg/m2  SpO2 95%  HEENT: Head is normocephalic and atraumatic. Extraocular muscles are intact. Pupils are equal, round, and reactive to light. Nares appeared normal. Mouth is well hydrated and without lesions. Mucous membranes are moist. Posterior pharynx clear of any exudate or lesions. NECK: Supple. No carotid bruits. No lymphadenopathy or thyromegaly. LUNGS: Distant breath sounds HEART: Regular rate and rhythm without murmur.  ABDOMEN: Soft, nontender, and nondistended. Positive bowel sounds. No hepatosplenomegaly was noted.  EXTREMITIES: Without any cyanosis, clubbing, rash, lesions or edema.  NEUROLOGIC: Cranial  nerves II through XII are grossly intact.  PSYCHIATRIC: Flat affect, but denies suicidal or homicidal ideations. SKIN: No ulceration or induration present.     Assessment & Plan:   COPD (chronic obstructive pulmonary disease), gold stage C. Chronic obstructive lung disease gold stage C. stable at this time Plan Maintain inhaled medications as prescribed Continue nocturnal oxygen therapy Administer Prevnar 13 vaccine     Updated Medication List Outpatient Encounter Prescriptions as of 01/11/2014  Medication Sig  . albuterol (PROVENTIL HFA;VENTOLIN HFA) 108 (90 BASE) MCG/ACT inhaler Inhale 2 puffs into the lungs every 4 (four) hours as needed for wheezing or shortness of breath.  . Ascorbic Acid (VITAMIN C PO) Take by mouth daily.  . budesonide-formoterol (SYMBICORT) 160-4.5 MCG/ACT inhaler Inhale 2 puffs into the lungs 2 (two) times daily.  . fish oil-omega-3 fatty acids 1000 MG capsule Take 2 g by mouth daily.  . Multiple Vitamins-Minerals (MULTIVITAMIN PO) Take by mouth daily.  . simvastatin (ZOCOR) 40 MG tablet Take 20 mg by mouth every evening.  . tiotropium (SPIRIVA HANDIHALER) 18 MCG inhalation capsule Place 1 capsule (18 mcg total) into inhaler and inhale daily.

## 2014-01-11 NOTE — Patient Instructions (Signed)
No change in medications Prevnar 13 will be ordered Return 6 months

## 2014-01-11 NOTE — Assessment & Plan Note (Signed)
Chronic obstructive lung disease gold stage C. stable at this time Plan Maintain inhaled medications as prescribed Continue nocturnal oxygen therapy Administer Prevnar 13 vaccine

## 2014-01-18 DIAGNOSIS — J449 Chronic obstructive pulmonary disease, unspecified: Secondary | ICD-10-CM | POA: Diagnosis not present

## 2014-01-21 ENCOUNTER — Telehealth: Payer: Self-pay | Admitting: Critical Care Medicine

## 2014-01-21 DIAGNOSIS — J449 Chronic obstructive pulmonary disease, unspecified: Secondary | ICD-10-CM

## 2014-01-21 NOTE — Telephone Encounter (Signed)
ONO results have come back. Per PW - pt qualifies for oxygen at night.  Pt's wife is aware of the results. States that this test was done to requalify him for nocturnal oxygen. A new order will be placed for nocturnal oxygen.

## 2014-04-12 DIAGNOSIS — Z79899 Other long term (current) drug therapy: Secondary | ICD-10-CM | POA: Diagnosis not present

## 2014-04-12 DIAGNOSIS — E7800 Pure hypercholesterolemia, unspecified: Secondary | ICD-10-CM | POA: Diagnosis not present

## 2014-04-12 DIAGNOSIS — R7309 Other abnormal glucose: Secondary | ICD-10-CM | POA: Diagnosis not present

## 2014-04-12 DIAGNOSIS — Z125 Encounter for screening for malignant neoplasm of prostate: Secondary | ICD-10-CM | POA: Diagnosis not present

## 2014-04-12 DIAGNOSIS — J449 Chronic obstructive pulmonary disease, unspecified: Secondary | ICD-10-CM | POA: Diagnosis not present

## 2014-04-29 DIAGNOSIS — H2589 Other age-related cataract: Secondary | ICD-10-CM | POA: Diagnosis not present

## 2014-05-12 DIAGNOSIS — L570 Actinic keratosis: Secondary | ICD-10-CM | POA: Diagnosis not present

## 2014-07-08 DIAGNOSIS — H2589 Other age-related cataract: Secondary | ICD-10-CM | POA: Diagnosis not present

## 2014-07-19 ENCOUNTER — Ambulatory Visit: Payer: Medicare Other | Admitting: Critical Care Medicine

## 2014-07-19 DIAGNOSIS — H268 Other specified cataract: Secondary | ICD-10-CM | POA: Diagnosis not present

## 2014-07-19 DIAGNOSIS — H2589 Other age-related cataract: Secondary | ICD-10-CM | POA: Diagnosis not present

## 2014-07-19 DIAGNOSIS — Z87891 Personal history of nicotine dependence: Secondary | ICD-10-CM | POA: Diagnosis not present

## 2014-07-19 DIAGNOSIS — J449 Chronic obstructive pulmonary disease, unspecified: Secondary | ICD-10-CM | POA: Diagnosis not present

## 2014-07-19 DIAGNOSIS — H269 Unspecified cataract: Secondary | ICD-10-CM | POA: Diagnosis not present

## 2014-07-19 DIAGNOSIS — Z79899 Other long term (current) drug therapy: Secondary | ICD-10-CM | POA: Diagnosis not present

## 2014-07-19 DIAGNOSIS — E785 Hyperlipidemia, unspecified: Secondary | ICD-10-CM | POA: Diagnosis not present

## 2014-07-26 ENCOUNTER — Ambulatory Visit (INDEPENDENT_AMBULATORY_CARE_PROVIDER_SITE_OTHER): Payer: Medicare Other | Admitting: Critical Care Medicine

## 2014-07-26 ENCOUNTER — Encounter: Payer: Self-pay | Admitting: Critical Care Medicine

## 2014-07-26 VITALS — BP 128/58 | HR 79 | Temp 97.7°F | Ht 66.0 in | Wt 138.4 lb

## 2014-07-26 DIAGNOSIS — J438 Other emphysema: Secondary | ICD-10-CM | POA: Diagnosis not present

## 2014-07-26 DIAGNOSIS — J432 Centrilobular emphysema: Secondary | ICD-10-CM

## 2014-07-26 DIAGNOSIS — J449 Chronic obstructive pulmonary disease, unspecified: Secondary | ICD-10-CM | POA: Diagnosis not present

## 2014-07-26 NOTE — Patient Instructions (Signed)
No change in medications. Return in        6 months        

## 2014-07-26 NOTE — Progress Notes (Signed)
  Subjective:    Patient ID: Paul [name], male    DOB: [birth year], 68 y.o.   MRN: [id number]  HPI  07/26/2014 Chief Complaint  Patient presents with  . Follow-up    sob same,Oxygen 2L at hs,cough-clear,occass. wheezing, denies cp or tightness  Since last ov doing well.  Pt is still dyspneic but not as bad as before. Notes min clear mucus.  occ wheezing .   No real chest pain .  Oxygen bedtime only. Exercises daily    Review of Systems  Constitutional: Negative for fever, chills and fatigue.  HENT: Negative for rhinorrhea, sinus pressure and sneezing.   Respiratory: Positive for cough and shortness of breath. Negative for chest tightness and wheezing.        Objective:   Physical Exam  BP 128/58  Pulse 79  Temp(Src) 97.7 F (36.5 C) (Oral)  Ht 5\' 6"  (1.676 m)  Wt 138 lb 6.4 oz (62.778 kg)  BMI 22.35 kg/m2  SpO2 98%  HEENT: Head is normocephalic and atraumatic. Extraocular muscles are intact. Pupils are equal, round, and reactive to light. Nares appeared normal. Mouth is well hydrated and without lesions. Mucous membranes are moist. Posterior pharynx clear of any exudate or lesions. NECK: Supple. No carotid bruits. No lymphadenopathy or thyromegaly. LUNGS: Distant breath sounds HEART: Regular rate and rhythm without murmur.  ABDOMEN: Soft, nontender, and nondistended. Positive bowel sounds. No hepatosplenomegaly was noted.  EXTREMITIES: Without any cyanosis, clubbing, rash, lesions or edema.  NEUROLOGIC: Cranial nerves II through XII are grossly intact.  PSYCHIATRIC: Flat affect, but denies suicidal or homicidal ideations. SKIN: No ulceration or induration present.     Assessment & Plan:   COPD (chronic obstructive pulmonary disease), gold stage C. Gold stage C. COPD with need for nocturnal oxygen therapy but no desaturation during ambulation Current status is stable Plan Maintain inhaled medications as prescribed Return 6 months    Updated Medication List Outpatient  Encounter Prescriptions as of 07/26/2014  Medication Sig  . albuterol (PROVENTIL HFA;VENTOLIN HFA) 108 (90 BASE) MCG/ACT inhaler Inhale 2 puffs into the lungs every 4 (four) hours as needed for wheezing or shortness of breath.  . Ascorbic Acid (VITAMIN C PO) Take by mouth daily.  . budesonide-formoterol (SYMBICORT) 160-4.5 MCG/ACT inhaler Inhale 2 puffs into the lungs 2 (two) times daily.  . fish oil-omega-3 fatty acids 1000 MG capsule Take 2 g by mouth daily.  . Multiple Vitamins-Minerals (MULTIVITAMIN PO) Take by mouth daily.  . simvastatin (ZOCOR) 40 MG tablet Take 20 mg by mouth every evening.  . tiotropium (SPIRIVA HANDIHALER) 18 MCG inhalation capsule Place 1 capsule (18 mcg total) into inhaler and inhale daily.

## 2014-07-26 NOTE — Assessment & Plan Note (Signed)
Gold stage C. COPD with need for nocturnal oxygen therapy but no desaturation during ambulation Current status is stable Plan Maintain inhaled medications as prescribed Return 6 months

## 2014-08-02 DIAGNOSIS — R7309 Other abnormal glucose: Secondary | ICD-10-CM | POA: Diagnosis not present

## 2014-08-02 DIAGNOSIS — Z79899 Other long term (current) drug therapy: Secondary | ICD-10-CM | POA: Diagnosis not present

## 2014-08-02 DIAGNOSIS — J449 Chronic obstructive pulmonary disease, unspecified: Secondary | ICD-10-CM | POA: Diagnosis not present

## 2014-08-02 DIAGNOSIS — E7800 Pure hypercholesterolemia, unspecified: Secondary | ICD-10-CM | POA: Diagnosis not present

## 2014-08-23 DIAGNOSIS — Z23 Encounter for immunization: Secondary | ICD-10-CM | POA: Diagnosis not present

## 2014-09-21 ENCOUNTER — Ambulatory Visit (INDEPENDENT_AMBULATORY_CARE_PROVIDER_SITE_OTHER): Payer: Medicare Other | Admitting: Critical Care Medicine

## 2014-09-21 ENCOUNTER — Encounter: Payer: Self-pay | Admitting: Critical Care Medicine

## 2014-09-21 ENCOUNTER — Encounter (INDEPENDENT_AMBULATORY_CARE_PROVIDER_SITE_OTHER): Payer: Self-pay

## 2014-09-21 VITALS — BP 124/60 | HR 75 | Temp 97.8°F | Ht 65.0 in | Wt 140.4 lb

## 2014-09-21 DIAGNOSIS — J441 Chronic obstructive pulmonary disease with (acute) exacerbation: Secondary | ICD-10-CM

## 2014-09-21 MED ORDER — HYDROCODONE-HOMATROPINE 5-1.5 MG/5ML PO SYRP: 5.0000 mL | ORAL_SOLUTION | Freq: Four times a day (QID) | ORAL | Status: DC | PRN

## 2014-09-21 MED ORDER — AZITHROMYCIN 250 MG PO TABS: ORAL_TABLET | ORAL | Status: DC

## 2014-09-21 NOTE — Assessment & Plan Note (Signed)
Acute tracheobronchitis with COPD exacerbation Plan 5 days of azithromycin was prescribed As needed Hycodan cough syrup was given No systemic steroids indicated Maintain Spiriva and Symbicort

## 2014-09-21 NOTE — Progress Notes (Signed)
Subjective:    Patient ID: Paul [name], male    DOB: [birth year], 68 y.o.   MRN: [id number]  HPI 09/21/2014 Chief Complaint  Patient presents with  . Acute Visit    Increased cough with green mucus x 2 wks.  No chest tightness/pain or f/c/s.     The patient notes a two-week history of progressive increased shortness of breath and productive cough of green material.  The patient notes increased wheezing. There is no chest pain fever chills or sweats.  Pt denies any significant sore throat, nasal congestion , fever, chills, sweats, unintended weight loss, pleurtic or exertional chest pain, orthopnea PND, or leg swelling Pt denies any increase in rescue therapy over baseline, denies waking up needing it or having any early am or nocturnal exacerbations of coughing/wheezing/or dyspnea. Pt also denies any obvious fluctuation in symptoms with  weather or environmental change or other alleviating or aggravating factors     Review of Systems Constitutional:   No  weight loss, night sweats,  Fevers, chills, fatigue, lassitude. HEENT:   No headaches,  Difficulty swallowing,  Tooth/dental problems,  Sore throat,                No sneezing, itching, ear ache, nasal congestion, post nasal drip,   CV:  No chest pain,  Orthopnea, PND, swelling in lower extremities, anasarca, dizziness, palpitations  GI  No heartburn, indigestion, abdominal pain, nausea, vomiting, diarrhea, change in bowel habits, loss of appetite  Resp: Notes  shortness of breath with exertion or at rest.  Notes excess mucus, notes  productive cough,  No non-productive cough,  No coughing up of blood.  Notes  change in color of mucus.  No wheezing.  No chest wall deformity  Skin: no rash or lesions.  GU: no dysuria, change in color of urine, no urgency or frequency.  No flank pain.  MS:  No joint pain or swelling.  No decreased range of motion.  No back pain.  Psych:  No change in mood or affect. No depression or anxiety.  No  memory loss.     Objective:   Physical Exam Filed Vitals:   09/21/14 1156  BP: 124/60  Pulse: 75  Temp: 97.8 F (36.6 C)  TempSrc: Oral  Height: 5\' 5"  (1.651 m)  Weight: 140 lb 6.4 oz (63.685 kg)  SpO2: 97%    Gen: Pleasant, well-nourished, in no distress,  normal affect  ENT: No lesions,  mouth clear,  oropharynx clear, no postnasal drip  Neck: No JVD, no TMG, no carotid bruits  Lungs: No use of accessory muscles, no dullness to percussion, exp wheezes, poor airflow  Cardiovascular: RRR, heart sounds normal, no murmur or gallops, no peripheral edema  Abdomen: soft and NT, no HSM,  BS normal  Musculoskeletal: No deformities, no cyanosis or clubbing  Neuro: alert, non focal  Skin: Warm, no lesions or rashes  No results found.       Assessment & Plan:   COPD exacerbation Acute tracheobronchitis with COPD exacerbation Plan 5 days of azithromycin was prescribed As needed Hycodan cough syrup was given No systemic steroids indicated Maintain Spiriva and Symbicort   Updated Medication List Outpatient Encounter Prescriptions as of 09/21/2014  Medication Sig  . albuterol (PROVENTIL HFA;VENTOLIN HFA) 108 (90 BASE) MCG/ACT inhaler Inhale 2 puffs into the lungs every 4 (four) hours as needed for wheezing or shortness of breath.  . Ascorbic Acid (VITAMIN C PO) Take by mouth daily.  . budesonide-formoterol (SYMBICORT)  160-4.5 MCG/ACT inhaler Inhale 2 puffs into the lungs 2 (two) times daily.  . fish oil-omega-3 fatty acids 1000 MG capsule Take 2 g by mouth daily.  . Multiple Vitamins-Minerals (MULTIVITAMIN PO) Take by mouth daily.  . simvastatin (ZOCOR) 40 MG tablet Take 20 mg by mouth every evening.  Marland Kitchen azithromycin (ZITHROMAX) 250 MG tablet Take two once then one daily until gone  . HYDROcodone-homatropine (HYCODAN) 5-1.5 MG/5ML syrup Take 5 mLs by mouth every 6 (six) hours as needed for cough.  . tiotropium (SPIRIVA HANDIHALER) 18 MCG inhalation capsule Place 1  capsule (18 mcg total) into inhaler and inhale daily.

## 2014-09-21 NOTE — Patient Instructions (Signed)
Azithromycin 250mg  Take two once then one daily until gone Hycodan cough syrup as needed Stay on inhalers Return January Hamler

## 2014-11-08 DIAGNOSIS — L821 Other seborrheic keratosis: Secondary | ICD-10-CM | POA: Diagnosis not present

## 2014-11-08 DIAGNOSIS — L570 Actinic keratosis: Secondary | ICD-10-CM | POA: Diagnosis not present

## 2014-11-23 ENCOUNTER — Telehealth: Payer: Self-pay | Admitting: Critical Care Medicine

## 2014-11-23 NOTE — Telephone Encounter (Signed)
Err

## 2014-11-25 DIAGNOSIS — J449 Chronic obstructive pulmonary disease, unspecified: Secondary | ICD-10-CM | POA: Diagnosis not present

## 2014-11-25 DIAGNOSIS — R7309 Other abnormal glucose: Secondary | ICD-10-CM | POA: Diagnosis not present

## 2014-11-25 DIAGNOSIS — Z79899 Other long term (current) drug therapy: Secondary | ICD-10-CM | POA: Diagnosis not present

## 2014-11-25 DIAGNOSIS — E782 Mixed hyperlipidemia: Secondary | ICD-10-CM | POA: Diagnosis not present

## 2014-11-25 DIAGNOSIS — E780 Pure hypercholesterolemia: Secondary | ICD-10-CM | POA: Diagnosis not present

## 2014-11-25 DIAGNOSIS — R7301 Impaired fasting glucose: Secondary | ICD-10-CM | POA: Diagnosis not present

## 2015-01-02 DIAGNOSIS — Z125 Encounter for screening for malignant neoplasm of prostate: Secondary | ICD-10-CM | POA: Diagnosis not present

## 2015-01-02 DIAGNOSIS — Z0000 Encounter for general adult medical examination without abnormal findings: Secondary | ICD-10-CM | POA: Diagnosis not present

## 2015-01-02 DIAGNOSIS — Z1211 Encounter for screening for malignant neoplasm of colon: Secondary | ICD-10-CM | POA: Diagnosis not present

## 2015-01-03 DIAGNOSIS — C44619 Basal cell carcinoma of skin of left upper limb, including shoulder: Secondary | ICD-10-CM | POA: Diagnosis not present

## 2015-01-05 ENCOUNTER — Encounter: Payer: Self-pay | Admitting: Critical Care Medicine

## 2015-01-05 ENCOUNTER — Ambulatory Visit (INDEPENDENT_AMBULATORY_CARE_PROVIDER_SITE_OTHER): Payer: Medicare Other | Admitting: Critical Care Medicine

## 2015-01-05 VITALS — BP 138/74 | HR 80 | Temp 98.4°F | Ht 66.0 in | Wt 144.0 lb

## 2015-01-05 DIAGNOSIS — J418 Mixed simple and mucopurulent chronic bronchitis: Secondary | ICD-10-CM | POA: Diagnosis not present

## 2015-01-05 MED ORDER — TIOTROPIUM BROMIDE-OLODATEROL 2.5-2.5 MCG/ACT IN AERS: 2.0000 | INHALATION_SPRAY | Freq: Every day | RESPIRATORY_TRACT | Status: DC

## 2015-01-05 NOTE — Progress Notes (Signed)
Subjective:    Patient ID: Paul [name], male    DOB: Jan 11, 1946, 69 y.o.   MRN: [id number]  HPI  01/05/2015 Chief Complaint  Patient presents with  . Follow-up    Discuss Spiriva.  Breathing okay.  slight cough.  Needs recertification for his oxygen at night  Pt doing well.  Wants to discuss change in therapy Pt denies any significant sore throat, nasal congestion or excess secretions, fever, chills, sweats, unintended weight loss, pleurtic or exertional chest pain, orthopnea PND, or leg swelling Pt denies any increase in rescue therapy over baseline, denies waking up needing it or having any early am or nocturnal exacerbations of coughing/wheezing/or dyspnea. Pt also denies any obvious fluctuation in symptoms with  weather or environmental change or other alleviating or aggravating factors  This patient continues to use and benefit from her oxygen therapy and has re qualified at this visit for continued oxygen therapy 2L QHS. Pt is compliant with oxygen.  Pt not needing a portable system     Review of Systems Constitutional:   No  weight loss, night sweats,  Fevers, chills, fatigue, lassitude. HEENT:   No headaches,  Difficulty swallowing,  Tooth/dental problems,  Sore throat,                No sneezing, itching, ear ache, nasal congestion, post nasal drip,   CV:  No chest pain,  Orthopnea, PND, swelling in lower extremities, anasarca, dizziness, palpitations  GI  No heartburn, indigestion, abdominal pain, nausea, vomiting, diarrhea, change in bowel habits, loss of appetite  Resp: Notes  shortness of breath with exertion or at rest.  Notes excess mucus, notes  productive cough,  No non-productive cough,  No coughing up of blood.  Notes  change in color of mucus.  No wheezing.  No chest wall deformity  Skin: no rash or lesions.  GU: no dysuria, change in color of urine, no urgency or frequency.  No flank pain.  MS:  No joint pain or swelling.  No decreased range of motion.  No  back pain.  Psych:  No change in mood or affect. No depression or anxiety.  No memory loss.     Objective:   Physical Exam Filed Vitals:   01/05/15 1015  BP: 138/74  Pulse: 80  Temp: 98.4 F (36.9 C)  TempSrc: Oral  Height: 5\' 6"  (1.676 m)  Weight: 144 lb (65.318 kg)  SpO2: 97%    Gen: Pleasant, well-nourished, in no distress,  normal affect  ENT: No lesions,  mouth clear,  oropharynx clear, no postnasal drip  Neck: No JVD, no TMG, no carotid bruits  Lungs: No use of accessory muscles, no dullness to percussion, exp wheezes, poor airflow  Cardiovascular: RRR, heart sounds normal, no murmur or gallops, no peripheral edema  Abdomen: soft and NT, no HSM,  BS normal  Musculoskeletal: No deformities, no cyanosis or clubbing  Neuro: alert, non focal  Skin: Warm, no lesions or rashes  No results found.       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 Need to simplify Rx for insurance reasons Plan Start Stiolto two puff daily  Stop symbicort and spiriva No other changes Return 6 months     Updated Medication List Outpatient Encounter Prescriptions as of 01/05/2015  Medication Sig  . albuterol (PROVENTIL HFA;VENTOLIN HFA) 108 (90 BASE) MCG/ACT inhaler Inhale 2 puffs into the lungs every 4 (four)  hours as needed for wheezing or shortness of breath.  . Ascorbic Acid (VITAMIN C PO) Take by mouth daily.  . fish oil-omega-3 fatty acids 1000 MG capsule Take 2 g by mouth daily.  Marland Kitchen HYDROcodone-homatropine (HYCODAN) 5-1.5 MG/5ML syrup Take 5 mLs by mouth every 6 (six) hours as needed for cough.  . Multiple Vitamins-Minerals (MULTIVITAMIN PO) Take by mouth daily.  . simvastatin (ZOCOR) 40 MG tablet Take 20 mg by mouth every evening.  . Tiotropium Bromide-Olodaterol (STIOLTO RESPIMAT) 2.5-2.5 MCG/ACT AERS Inhale 2 puffs into the lungs daily.  . [DISCONTINUED] budesonide-formoterol  (SYMBICORT) 160-4.5 MCG/ACT inhaler Inhale 2 puffs into the lungs 2 (two) times daily.  . [DISCONTINUED] tiotropium (SPIRIVA HANDIHALER) 18 MCG inhalation capsule Place 1 capsule (18 mcg total) into inhaler and inhale daily.  . [DISCONTINUED] Tiotropium Bromide-Olodaterol (STIOLTO RESPIMAT) 2.5-2.5 MCG/ACT AERS Inhale 2 puffs into the lungs daily.  . [DISCONTINUED] azithromycin (ZITHROMAX) 250 MG tablet Take two once then one daily until gone (Patient not taking: Reported on 01/05/2015)

## 2015-01-05 NOTE — Patient Instructions (Signed)
Start Stiolto two puff daily  Stop symbicort and spiriva No other changes Return 6 months

## 2015-01-06 NOTE — Assessment & Plan Note (Signed)
Copd with chronic bronchitis stable at present. Need to simplify Rx for insurance reasons Plan Start Stiolto two puff daily  Stop symbicort and spiriva No other changes Return 6 months

## 2015-04-24 DIAGNOSIS — R7301 Impaired fasting glucose: Secondary | ICD-10-CM | POA: Diagnosis not present

## 2015-04-24 DIAGNOSIS — J449 Chronic obstructive pulmonary disease, unspecified: Secondary | ICD-10-CM | POA: Diagnosis not present

## 2015-04-24 DIAGNOSIS — Z79899 Other long term (current) drug therapy: Secondary | ICD-10-CM | POA: Diagnosis not present

## 2015-04-24 DIAGNOSIS — E780 Pure hypercholesterolemia: Secondary | ICD-10-CM | POA: Diagnosis not present

## 2015-05-30 DIAGNOSIS — L570 Actinic keratosis: Secondary | ICD-10-CM | POA: Diagnosis not present

## 2015-07-04 ENCOUNTER — Ambulatory Visit: Payer: Medicare Other | Admitting: Critical Care Medicine

## 2015-07-04 ENCOUNTER — Ambulatory Visit (INDEPENDENT_AMBULATORY_CARE_PROVIDER_SITE_OTHER): Payer: Medicare Other | Admitting: Critical Care Medicine

## 2015-07-04 ENCOUNTER — Encounter: Payer: Self-pay | Admitting: Critical Care Medicine

## 2015-07-04 VITALS — BP 122/60 | HR 87 | Temp 97.2°F | Ht 67.0 in | Wt 144.2 lb

## 2015-07-04 DIAGNOSIS — J418 Mixed simple and mucopurulent chronic bronchitis: Secondary | ICD-10-CM

## 2015-07-04 MED ORDER — FLUTTER DEVI: Status: DC

## 2015-07-04 NOTE — Patient Instructions (Signed)
Use flutter valve 2-3 times daily No change in inhaled medications Return 4 months with Dr Ashok Cordia

## 2015-07-05 NOTE — Progress Notes (Signed)
Subjective:    Patient ID: Paul [name], male    DOB: Jun 21, 1946, 69 y.o.   MRN: [id number]  HPI 07/05/2015 Chief Complaint  Patient presents with  . Follow-up    weather has been causing problems with breathing.  Went back Symbicort and Spiriva.  This works best for him.  needs refill of cough syrup.  Still has cough and helps him cough up the phlegm.    Doing well but has diff with mucus and clearing this. Wanting cough syrup for mucus.  Pt denies any significant sore throat, nasal congestion but has  excess secretions hard to raise, NO fever, chills, sweats, unintended weight loss, pleurtic or exertional chest pain, orthopnea PND, or leg swelling Pt denies any increase in rescue therapy over baseline, denies waking up needing it or having any early am or nocturnal exacerbations of wheezing/or dyspnea. Pt also denies any obvious fluctuation in symptoms with  weather or environmental change or other alleviating or aggravating factors   Current Medications, Allergies, Complete Past Medical History, Past Surgical History, Family History, and Social History were reviewed in [facility] record per todays encounter:  07/05/2015 Review of Systems  Constitutional: Negative.   HENT: Negative.  Negative for ear pain, postnasal drip, rhinorrhea, sinus pressure, sore throat, trouble swallowing and voice change.   Eyes: Negative.   Respiratory: Positive for cough, shortness of breath and wheezing. Negative for apnea, choking, chest tightness and stridor.   Cardiovascular: Negative.  Negative for chest pain, palpitations and leg swelling.  Gastrointestinal: Negative.  Negative for nausea, vomiting, abdominal pain and abdominal distention.  Genitourinary: Negative.   Musculoskeletal: Negative.  Negative for myalgias and arthralgias.  Skin: Negative.  Negative for rash.  Allergic/Immunologic: Negative.  Negative for environmental allergies and food allergies.  Neurological:  Negative.  Negative for dizziness, syncope, weakness and headaches.  Hematological: Negative.  Negative for adenopathy. Does not bruise/bleed easily.  Psychiatric/Behavioral: Negative.  Negative for sleep disturbance and agitation. The patient is not nervous/anxious.        Objective:   Physical Exam Filed Vitals:   07/04/15 1008  BP: 122/60  Pulse: 87  Temp: 97.2 F (36.2 C)  TempSrc: Oral  Height: 5\' 7"  (1.702 m)  Weight: 144 lb 3.2 oz (65.409 kg)  SpO2: 91%    Gen: Pleasant, well-nourished, in no distress,  normal affect  ENT: No lesions,  mouth clear,  oropharynx clear, no postnasal drip  Neck: No JVD, no TMG, no carotid bruits  Lungs: No use of accessory muscles, no dullness to percussion, distant bs  Cardiovascular: RRR, heart sounds normal, no murmur or gallops, no peripheral edema  Abdomen: soft and NT, no HSM,  BS normal  Musculoskeletal: No deformities, no cyanosis or clubbing  Neuro: alert, non focal  Skin: Warm, no lesions or rashes  No results found.        Assessment & Plan:  I personally reviewed all images and lab data in the [facility] system as well as any outside material available during this office visit and agree with the  radiology impressions.   COPD (chronic obstructive pulmonary disease), gold stage C. Gold C Copd  Associated excess mucus difficult to clear Stable at present on symbicort/spiriva Failed Stiolto Plan Add flutter valve Cont triple med inhalers : ics/lama/laba   Remijio was seen today for follow-up.  Diagnoses and all orders for this visit:  Mixed simple and mucopurulent chronic bronchitis  Other orders -     Respiratory Therapy Supplies (  FLUTTER) DEVI; Use 2-3 times daily    I had an extended discussion with the patient and or family lasting 10 minutes of a 25 minute visit including:  Need to add flutter valve

## 2015-07-05 NOTE — Assessment & Plan Note (Signed)
Gold C Copd  Associated excess mucus difficult to clear Stable at present on symbicort/spiriva Failed Stiolto Plan Add flutter valve Cont triple med inhalers : ics/lama/laba

## 2015-07-27 DIAGNOSIS — Z79899 Other long term (current) drug therapy: Secondary | ICD-10-CM | POA: Diagnosis not present

## 2015-07-27 DIAGNOSIS — R7301 Impaired fasting glucose: Secondary | ICD-10-CM | POA: Diagnosis not present

## 2015-07-27 DIAGNOSIS — J449 Chronic obstructive pulmonary disease, unspecified: Secondary | ICD-10-CM | POA: Diagnosis not present

## 2015-07-27 DIAGNOSIS — E780 Pure hypercholesterolemia: Secondary | ICD-10-CM | POA: Diagnosis not present

## 2015-10-09 DIAGNOSIS — Z23 Encounter for immunization: Secondary | ICD-10-CM | POA: Diagnosis not present

## 2015-11-02 DIAGNOSIS — E7800 Pure hypercholesterolemia, unspecified: Secondary | ICD-10-CM | POA: Diagnosis not present

## 2015-11-02 DIAGNOSIS — R7301 Impaired fasting glucose: Secondary | ICD-10-CM | POA: Diagnosis not present

## 2015-11-02 DIAGNOSIS — J449 Chronic obstructive pulmonary disease, unspecified: Secondary | ICD-10-CM | POA: Diagnosis not present

## 2015-11-02 DIAGNOSIS — Z961 Presence of intraocular lens: Secondary | ICD-10-CM | POA: Diagnosis not present

## 2015-11-07 DIAGNOSIS — E784 Other hyperlipidemia: Secondary | ICD-10-CM | POA: Diagnosis not present

## 2015-11-07 DIAGNOSIS — R7301 Impaired fasting glucose: Secondary | ICD-10-CM | POA: Diagnosis not present

## 2015-11-07 DIAGNOSIS — J410 Simple chronic bronchitis: Secondary | ICD-10-CM | POA: Diagnosis not present

## 2015-11-30 ENCOUNTER — Telehealth: Payer: Self-pay | Admitting: Pulmonary Disease

## 2015-11-30 NOTE — Telephone Encounter (Signed)
PFT 07/15/13: FVC 2.68 L (60%) FEV1 1.63 L (53%) FEV1/FVC 0.61 FEF 25-75 0.76 L (26%) 02/04/12: FVC 2.18 L (57%) FEV1 1.09 L (36%) FEV1/FVC 0.50 FEF 25-75 0.33 L (11%)  IMAGING CXR PA/LAT 02/04/12 (per radiologist): Hyperinflation with opacities left greater than right. Pleural thickening versus small pleural effusion bilaterally left greater than right. Density in the major fissure on lateral likely reflecting fluid.

## 2015-12-01 ENCOUNTER — Ambulatory Visit (INDEPENDENT_AMBULATORY_CARE_PROVIDER_SITE_OTHER): Payer: Medicare Other | Admitting: Pulmonary Disease

## 2015-12-01 ENCOUNTER — Other Ambulatory Visit: Payer: Self-pay | Admitting: Acute Care

## 2015-12-01 ENCOUNTER — Encounter: Payer: Self-pay | Admitting: Pulmonary Disease

## 2015-12-01 VITALS — BP 142/80 | HR 90 | Ht 66.0 in | Wt 150.4 lb

## 2015-12-01 DIAGNOSIS — J449 Chronic obstructive pulmonary disease, unspecified: Secondary | ICD-10-CM | POA: Diagnosis not present

## 2015-12-01 DIAGNOSIS — J9611 Chronic respiratory failure with hypoxia: Secondary | ICD-10-CM | POA: Diagnosis not present

## 2015-12-01 DIAGNOSIS — Z87891 Personal history of nicotine dependence: Secondary | ICD-10-CM

## 2015-12-01 NOTE — Patient Instructions (Signed)
1. Continue using your inhalers as prescribed. 2. We are going to do some breathing and walking tests on or before your next appointment to see how your lungs are working. 3. I will see you back in 6 months or sooner if needed. Call me if you have any new breathing problems.

## 2015-12-01 NOTE — Progress Notes (Signed)
Subjective:    Patient ID: Paul [name], male    DOB: Sep 16, 1946, 69 y.o.   MRN: [id number]  C.C.:  Follow-up for Severe COPD & Chronic Nocturnal Hypoxic Respiratory Failure.  HPI Severe COPD:  He reports he is compliant with his Symbicort & Spiriva religiously. He denies using his Symbicort with a spacer. He does feel his inhalers are helping. He uses his rescue inhaler rarely. He does use it more when he is playing golf or exposed to significant pollen. No exacerbations since last appointment. He reports exacerbations perhaps once a year. He does use a flutter valve intermittently when he is having increased mucus. He does cough frequently and produces a thick, white mucus. Denies any hemoptysis. Does wheeze occasionally.   Chronic Nocturnal Hypoxic Respiratory Failure:  Reports he uses 2 L/m at night only while sleeping. He has been prescribed oxygen for nearly 3 years per his report.  Review of Systems No chest pain or pressure. No fever, chills, or sweats. No reflux, dyspepsia, or morning brash water taste in the past but has had some mild, infrequent indigestion lately relieved with Mylanta.   No Known Allergies  Current Outpatient Prescriptions on File Prior to Visit  Medication Sig Dispense Refill  . albuterol (PROVENTIL HFA;VENTOLIN HFA) 108 (90 BASE) MCG/ACT inhaler Inhale 2 puffs into the lungs every 4 (four) hours as needed for wheezing or shortness of breath.    . Ascorbic Acid (VITAMIN C PO) Take by mouth daily.    . budesonide-formoterol (SYMBICORT) 160-4.5 MCG/ACT inhaler Inhale 2 puffs into the lungs 2 (two) times daily.    . fish oil-omega-3 fatty acids 1000 MG capsule Take 2 g by mouth daily.    . Multiple Vitamins-Minerals (MULTIVITAMIN PO) Take by mouth daily.    Marland Kitchen Respiratory Therapy Supplies (FLUTTER) DEVI Use 2-3 times daily 1 each 0  . simvastatin (ZOCOR) 40 MG tablet Take 20 mg by mouth every evening.    . tiotropium (SPIRIVA) 18 MCG inhalation capsule Place 18 mcg  into inhaler and inhale daily.     No current facility-administered medications on file prior to visit.    Past Medical History  Diagnosis Date  . High cholesterol   . COPD (chronic obstructive pulmonary disease) [facility])     Past Surgical History  Procedure Laterality Date  . Skin surgery      shoulders and chest  . Cataract extraction Right     Family History  Problem Relation Age of Onset  . Heart disease Brother   . Heart disease Mother   . Heart disease Father   . Cancer Paternal Uncle     Social History   Social History  . Marital Status: Married    Spouse Name: N/A  . Number of Children: 3  . Years of Education: N/A   Occupational History  . retired     Administrator   Social History Main Topics  . Smoking status: Former Smoker -- 2.00 packs/day for 52 years    Types: Cigarettes    Quit date: 02/03/2009  . Smokeless tobacco: Never Used  . Alcohol Use: 0.0 oz/week    0 Standard drinks or equivalent per week     Comment: Beer & Wine occasionally.  . Drug Use: No  . Sexual Activity: Not Asked   Other Topics Concern  . None   Social History Narrative   Originally from [place]. Previously worked driving a Restaurant manager, fast food. No pets currently. No mold exposure. 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       Objective:   Physical Exam BP 142/80 mmHg  Pulse 90  Ht 5\' 6"  (1.676 m)  Wt 150 lb 6.4 oz (68.221 kg)  BMI 24.29 kg/m2  SpO2 97% General:  Awake. Alert. No acute distress. Wife present.  Integument:  Warm & dry. No rash on exposed skin. No bruising. HEENT:  Moist mucus membranes. No oral ulcers. No scleral injection or icterus.  Cardiovascular:  Regular rate. No edema. No appreciable JVD.  Pulmonary:  Good aeration & clear to auscultation bilaterally. Symmetric chest wall expansion. No accessory muscle use. Abdomen: Soft. Normal bowel sounds. Nondistended. Grossly nontender. Musculoskeletal:   Normal bulk and tone. Hand grip strength 5/5 bilaterally. Partial amputation of left hand digits.  PFT 07/15/13: FVC 2.68 L (60%) FEV1 1.63 L (53%) FEV1/FVC 0.61 FEF 25-75 0.76 L (26%) 02/04/12: FVC 2.18 L (57%) FEV1 1.09 L (36%) FEV1/FVC 0.50 FEF 25-75 0.33 L (11%)  IMAGING CXR PA/LAT 02/04/12 (per radiologist): Hyperinflation with opacities left greater than right. Pleural thickening versus small pleural effusion bilaterally left greater than right. Density in the major fissure on lateral likely reflecting fluid.    Assessment & Plan:  69 year old Caucasian male with significant prior history of tobacco use. Patient continuing to use inhalers as prescribed with excellent control of underlying COPD. No exacerbation since last appointment. Continuing to use oxygen at night while sleeping. Plan to screen the patient further for obstructive sleep apnea at next appointment.  1. Severe COPD: Continuing Spiriva & Symbicort. Obtaining outside physician test results for alpha-1 antitrypsin. Checking full pulmonary function testing and 6 minute walk test on her before next appointment. 2. Chronic nocturnal hypoxic respiratory failure: Continuing oxygen as previously prescribed. 6 minute walk test BEFORE next appointment. 3. History of tobacco use: Referring the patient to our lung cancer screening program. I believe he would be an excellent candidate. 4. Health maintenance: Reports he did receive influenza vaccine this fall. Previously received Prevnar vaccine. 5. Follow-up: Patient to return to clinic in 6 months or sooner if needed.

## 2015-12-05 ENCOUNTER — Ambulatory Visit
Admission: RE | Admit: 2015-12-05 | Discharge: 2015-12-05 | Disposition: A | Discharge status: Home / Self Care | Payer: Medicare Other | Source: Ambulatory Visit | Attending: Acute Care | Admitting: Acute Care

## 2015-12-05 ENCOUNTER — Ambulatory Visit (INDEPENDENT_AMBULATORY_CARE_PROVIDER_SITE_OTHER): Payer: Medicare Other | Admitting: Acute Care

## 2015-12-05 ENCOUNTER — Encounter: Payer: Self-pay | Admitting: Acute Care

## 2015-12-05 DIAGNOSIS — Z87891 Personal history of nicotine dependence: Secondary | ICD-10-CM

## 2015-12-05 NOTE — Progress Notes (Signed)
Shared Decision Making Visit Lung Cancer Screening Program [id number])   Eligibility:  Age 69 y.o.  Pack Years Smoking History Calculation 100 pack year smoking history (# packs/per year x # years smoked)  Recent History of coughing up blood  no  Unexplained weight loss? no ( >Than 15 pounds within the last 6 months )  Prior History Lung / other cancer no (Diagnosis within the last 5 years already requiring surveillance chest CT Scans).  Smoking Status Former Smoker  Former Smokers: Years since quit: 6 years  Quit Date: 2010  Visit Components:  Discussion included one or more decision making aids. yes  Discussion included risk/benefits of screening. yes  Discussion included potential follow up diagnostic testing for abnormal scans. yes  Discussion included meaning and risk of over diagnosis. yes  Discussion included meaning and risk of False Positives. yes  Discussion included meaning of total radiation exposure. yes  Counseling Included:  Importance of adherence to annual lung cancer LDCT screening. yes  Impact of comorbidities on ability to participate in the program. yes  Ability and willingness to under diagnostic treatment. yes  Smoking Cessation Counseling:  Current Smokers:   Discussed importance of smoking cessation.Former smoker  Information about tobacco cessation classes and interventions provided to patient. yes  Patient provided with "ticket" for LDCT Scan. yes  Symptomatic Patient. no  Counseling: NA  Diagnosis Code: Tobacco Use Z72.0  Asymptomatic Patient yes  Counseling Former smoker  Former Smokers:   Discussed the importance of maintaining cigarette abstinence. yes  Diagnosis Code: Personal History of Nicotine Dependence. [id number]  Information about tobacco cessation classes and interventions provided to patient. Yes  Patient provided with "ticket" for LDCT Scan. yes  Written Order for Lung Cancer Screening with LDCT placed in  Epic. Yes (CT Chest Lung Cancer Screening Low Dose W/O CM) [id number] Z12.2-Screening of respiratory organs Z87.891-Personal history of nicotine dependence  I spent 15 minutes of face to face time with Mr. And Mrs. Lumbard discussing the risks and benefits of lung cancer screening. We viewed a power point together that explained in detail all of the above noted topics. We took the time to pause and stop at intervals, as needed, to allow for questions to be asked and answered to ensure understanding. We discussed that he had already taken the single most powerful action possible to decrease his risk of developing lung cancer when he quit smoking.He did this using Chantix for 30 days. We discussed the importance of remaining smoke free , and I asked that he call me if he ever has the urge to smoke again so that I can provide him with the tools and support he needs to remain smoke free. I have given him my card and contact information. We discussed the time and location of the appointment for the CT scan, and that either June Leap, CMA or I will call him with the results within 24-48 hours of getting them. He gave me permission to give his wife Gwenette Greet the results if she is available for results upon calling them. Both Mr. And Mrs. Ryba verbalized understanding of all of the above and had no further questions upon leaving the office.   Magdalen Spatz, NP

## 2015-12-06 ENCOUNTER — Telehealth: Payer: Self-pay | Admitting: Pulmonary Disease

## 2015-12-06 DIAGNOSIS — L570 Actinic keratosis: Secondary | ICD-10-CM | POA: Diagnosis not present

## 2015-12-06 NOTE — Telephone Encounter (Signed)
Called & spoke with wife. Plan to call back around 5pm today to discuss CT results.

## 2015-12-06 NOTE — Telephone Encounter (Signed)
Patient contacted regarding results of low-dose chest CT imaging which shows multiple right-sided subcentimeter nodules with largest nodule measuring 0.5 cm across at maximal dimension and approximately 1 cm in length maximally. There is some groundglass component to this nodule with questionable distortion/spiculation. According to the [facility] nodule risk calculator the patient has a 10.6% risk of this nodule representing malignancy. However given his prior history of pneumonia twice I feel residual scar tissue is more probable. Given his prior history of tobacco use however close follow-up is reasonable. We did discuss PET/CT imaging but given the size and characteristics of the nodule I feel it would be below the resolution of this test. We will plan on repeating a CT scan in 3 months. I instructed them to contact my office if he had any further questions or concerns.

## 2015-12-07 ENCOUNTER — Telehealth: Payer: Self-pay | Admitting: Acute Care

## 2015-12-07 NOTE — Telephone Encounter (Signed)
Dr. Ashok Cordia spoke at length with this patient and his wife about his LDCT scan results. We will schedule the follow up CT for March 2017, as was agreed upon in the telephone conversation 12/06/15.

## 2015-12-22 DIAGNOSIS — H25812 Combined forms of age-related cataract, left eye: Secondary | ICD-10-CM | POA: Diagnosis not present

## 2016-01-04 ENCOUNTER — Encounter: Payer: Self-pay | Admitting: Pulmonary Disease

## 2016-01-11 DIAGNOSIS — Z125 Encounter for screening for malignant neoplasm of prostate: Secondary | ICD-10-CM | POA: Diagnosis not present

## 2016-01-11 DIAGNOSIS — Z1211 Encounter for screening for malignant neoplasm of colon: Secondary | ICD-10-CM | POA: Diagnosis not present

## 2016-01-11 DIAGNOSIS — R7301 Impaired fasting glucose: Secondary | ICD-10-CM | POA: Diagnosis not present

## 2016-01-11 DIAGNOSIS — Z0000 Encounter for general adult medical examination without abnormal findings: Secondary | ICD-10-CM | POA: Diagnosis not present

## 2016-01-11 DIAGNOSIS — Z6823 Body mass index (BMI) 23.0-23.9, adult: Secondary | ICD-10-CM | POA: Diagnosis not present

## 2016-01-16 DIAGNOSIS — Z79899 Other long term (current) drug therapy: Secondary | ICD-10-CM | POA: Diagnosis not present

## 2016-01-16 DIAGNOSIS — Z87891 Personal history of nicotine dependence: Secondary | ICD-10-CM | POA: Diagnosis not present

## 2016-01-16 DIAGNOSIS — J449 Chronic obstructive pulmonary disease, unspecified: Secondary | ICD-10-CM | POA: Diagnosis not present

## 2016-01-16 DIAGNOSIS — H25812 Combined forms of age-related cataract, left eye: Secondary | ICD-10-CM | POA: Diagnosis not present

## 2016-01-25 ENCOUNTER — Telehealth: Payer: Self-pay | Admitting: Pulmonary Disease

## 2016-01-25 DIAGNOSIS — Z87891 Personal history of nicotine dependence: Secondary | ICD-10-CM

## 2016-01-25 NOTE — Telephone Encounter (Signed)
Low Dose Chest CT please.

## 2016-01-25 NOTE — Telephone Encounter (Signed)
Pt calling to set up CT scan for March. Is this for another LDCT screen or CT chest w/ or w/o contrast?

## 2016-01-26 NOTE — Telephone Encounter (Signed)
Order has been placed. Pt spouse aware and needed nothing further

## 2016-02-09 DIAGNOSIS — R7301 Impaired fasting glucose: Secondary | ICD-10-CM | POA: Diagnosis not present

## 2016-02-09 DIAGNOSIS — E782 Mixed hyperlipidemia: Secondary | ICD-10-CM | POA: Diagnosis not present

## 2016-02-13 ENCOUNTER — Telehealth: Payer: Self-pay | Admitting: Acute Care

## 2016-02-13 NOTE — Telephone Encounter (Signed)
Patient's wife had called me regarding scheduling a follow-up CT scan. 3 month follow-up was recommended after initial screening scan. Paul [name] is scheduled for 03/04/2016 follow-up scan. The patient and his wife are both aware of the date and time of the appointment. They had no further questions upon ending the phone call.

## 2016-02-14 DIAGNOSIS — R7301 Impaired fasting glucose: Secondary | ICD-10-CM | POA: Diagnosis not present

## 2016-02-14 DIAGNOSIS — J410 Simple chronic bronchitis: Secondary | ICD-10-CM | POA: Diagnosis not present

## 2016-02-14 DIAGNOSIS — E782 Mixed hyperlipidemia: Secondary | ICD-10-CM | POA: Diagnosis not present

## 2016-03-04 ENCOUNTER — Ambulatory Visit (INDEPENDENT_AMBULATORY_CARE_PROVIDER_SITE_OTHER)
Admission: RE | Admit: 2016-03-04 | Discharge: 2016-03-04 | Disposition: A | Discharge status: Home / Self Care | Payer: Medicare Other | Source: Ambulatory Visit | Attending: Pulmonary Disease | Admitting: Pulmonary Disease

## 2016-03-04 DIAGNOSIS — Z87891 Personal history of nicotine dependence: Secondary | ICD-10-CM | POA: Diagnosis not present

## 2016-03-04 DIAGNOSIS — R918 Other nonspecific abnormal finding of lung field: Secondary | ICD-10-CM

## 2016-03-04 DIAGNOSIS — J439 Emphysema, unspecified: Secondary | ICD-10-CM | POA: Diagnosis not present

## 2016-03-05 ENCOUNTER — Other Ambulatory Visit: Payer: Self-pay | Admitting: Acute Care

## 2016-03-05 ENCOUNTER — Telehealth: Payer: Self-pay | Admitting: Acute Care

## 2016-03-05 DIAGNOSIS — Z87891 Personal history of nicotine dependence: Secondary | ICD-10-CM

## 2016-03-05 NOTE — Telephone Encounter (Signed)
I have called these results to Paul [name]. I explained that his scan was read as a Lung RADS 2: which indicates  nodules that are benign in appearance and behavior with a very low likelihood of becoming a clinically active cancer due to size or lack of growth. Recommendation per radiology is for a repeat LDCT in 12 months.I explained that we would order and schedule the scan in March of 2018, and that we will call him closer to the time to schedule. I also told him if he were to develop a cough with blood in his sputum, or if he had unintentional weight loss to call the office for sooner follow up. He verbalized understanding of the above and had no further questions upon ending the call.I will fax the scan results to the patient's PCP and Pulmonologist, Dr. Ashok Cordia. Of note, the patient is already on statin therapy.

## 2016-03-05 NOTE — Telephone Encounter (Signed)
I personally reviewed the patient's chest CT scan. Given the size and prior history of tobacco use I remain suspicious about the potential for a developing malignancy with the patient's risk according to the [facility]'s Solitary Pulmonary Nodule Risk calculator is 27%. I would favor repeating a CT scan of the chest without contrast in June 2017 or September 2017 at the latest to ensure no further progression. I contacted the patient to make him aware of my thoughts. Please see if this is feasible with his insurance company given his risk of cancer.

## 2016-04-27 DIAGNOSIS — J449 Chronic obstructive pulmonary disease, unspecified: Secondary | ICD-10-CM | POA: Diagnosis not present

## 2016-04-27 DIAGNOSIS — J209 Acute bronchitis, unspecified: Secondary | ICD-10-CM | POA: Diagnosis not present

## 2016-05-21 DIAGNOSIS — R7301 Impaired fasting glucose: Secondary | ICD-10-CM | POA: Diagnosis not present

## 2016-05-21 DIAGNOSIS — Z125 Encounter for screening for malignant neoplasm of prostate: Secondary | ICD-10-CM | POA: Diagnosis not present

## 2016-05-21 DIAGNOSIS — Z129 Encounter for screening for malignant neoplasm, site unspecified: Secondary | ICD-10-CM | POA: Diagnosis not present

## 2016-05-21 DIAGNOSIS — Z79899 Other long term (current) drug therapy: Secondary | ICD-10-CM | POA: Diagnosis not present

## 2016-05-21 DIAGNOSIS — E782 Mixed hyperlipidemia: Secondary | ICD-10-CM | POA: Diagnosis not present

## 2016-05-23 DIAGNOSIS — R7301 Impaired fasting glucose: Secondary | ICD-10-CM | POA: Diagnosis not present

## 2016-05-23 DIAGNOSIS — J410 Simple chronic bronchitis: Secondary | ICD-10-CM | POA: Diagnosis not present

## 2016-05-23 DIAGNOSIS — E782 Mixed hyperlipidemia: Secondary | ICD-10-CM | POA: Diagnosis not present

## 2016-05-29 ENCOUNTER — Ambulatory Visit (INDEPENDENT_AMBULATORY_CARE_PROVIDER_SITE_OTHER): Payer: Medicare Other | Admitting: Pulmonary Disease

## 2016-05-29 ENCOUNTER — Encounter: Payer: Self-pay | Admitting: Pulmonary Disease

## 2016-05-29 VITALS — BP 134/54 | HR 77 | Temp 97.8°F | Ht 65.5 in | Wt 146.0 lb

## 2016-05-29 DIAGNOSIS — R911 Solitary pulmonary nodule: Secondary | ICD-10-CM | POA: Diagnosis not present

## 2016-05-29 DIAGNOSIS — J449 Chronic obstructive pulmonary disease, unspecified: Secondary | ICD-10-CM | POA: Diagnosis not present

## 2016-05-29 DIAGNOSIS — IMO0001 Reserved for inherently not codable concepts without codable children: Secondary | ICD-10-CM

## 2016-05-29 DIAGNOSIS — J9611 Chronic respiratory failure with hypoxia: Secondary | ICD-10-CM

## 2016-05-29 HISTORY — DX: Reserved for inherently not codable concepts without codable children: IMO0001

## 2016-05-29 MED ORDER — TIOTROPIUM BROMIDE MONOHYDRATE 2.5 MCG/ACT IN AERS: 2.0000 | INHALATION_SPRAY | Freq: Every day | RESPIRATORY_TRACT | Status: DC

## 2016-05-29 NOTE — Patient Instructions (Signed)
   Continue your Symbicort  I'm switching you over to Spiriva Respimat  We will do an overnight oxygen test on your new portable oxygen concentrator at 2 L/m to make sure your oxygen is appropriate.  I will see you back in 6 months or sooner if needed.  TESTS ORDERED: 1. Overnight oximetry on 2 L/m via portable concentrator  2. CT chest without contrast this month

## 2016-05-29 NOTE — Progress Notes (Signed)
Subjective:    Patient ID: Paul [name], male    DOB: 20-May-1946, 70 y.o.   MRN: [id number]  C.C.:  Follow-up for Very, Severe COPD, Chronic Nocturnal Hypoxic Respiratory Failure, & Right Lower Lobe Nodule.  HPI Very, Severe COPD:  Prescribed Symbicort and Spiriva. Patient reports he was having some problems breathing during recent travels to [place], [place], as well as when traveling to [place], [place]. Dyspnea is improving. Continues to have intermittent cough productive of a "yellow-gray" mucus. No wheezing. No recent nocturnal awakenings with coughing. Patient is still golfing regularly.  Chronic Nocturnal Hypoxic Respiratory Failure:  Using oxygen at 2 L/m while sleeping. No evidence of oxygen requirement with ambulation on 6 minute walk test.  Right Lower Lobe Nodule:  Noted on low dose chest CT imaging measuring 1.1 cm December 2016. No change on follow-up imaging in March 2017.  Review of Systems No sinus congestion, pressure, or drainage. He has had some mild reflux lately but no dyspepsia. No morning brash water taste.   No Known Allergies  Current Outpatient Prescriptions on File Prior to Visit  Medication Sig Dispense Refill  . albuterol (PROVENTIL HFA;VENTOLIN HFA) 108 (90 BASE) MCG/ACT inhaler Inhale 2 puffs into the lungs every 4 (four) hours as needed for wheezing or shortness of breath.    . Ascorbic Acid (VITAMIN C PO) Take by mouth daily.    . budesonide-formoterol (SYMBICORT) 160-4.5 MCG/ACT inhaler Inhale 2 puffs into the lungs 2 (two) times daily.    . fish oil-omega-3 fatty acids 1000 MG capsule Take 2 g by mouth daily.    Marland Kitchen loratadine (CLARITIN) 10 MG tablet Take 10 mg by mouth daily.    . Multiple Vitamins-Minerals (MULTIVITAMIN PO) Take by mouth daily.    Marland Kitchen Respiratory Therapy Supplies (FLUTTER) DEVI Use 2-3 times daily 1 each 0  . simvastatin (ZOCOR) 40 MG tablet Take 20 mg by mouth every evening.    . tiotropium (SPIRIVA) 18 MCG inhalation capsule Place 18 mcg into  inhaler and inhale daily.     No current facility-administered medications on file prior to visit.    Past Medical History  Diagnosis Date  . High cholesterol   . COPD (chronic obstructive pulmonary disease) [facility])     Past Surgical History  Procedure Laterality Date  . Skin surgery      shoulders and chest  . Cataract extraction Right     Family History  Problem Relation Age of Onset  . Heart disease Brother   . Heart disease Mother   . Heart disease Father   . Cancer Paternal Uncle     Social History   Social History  . Marital Status: Married    Spouse Name: N/A  . Number of Children: 3  . Years of Education: N/A   Occupational History  . retired     Administrator   Social History Main Topics  . Smoking status: Former Smoker -- 2.00 packs/day for 52 years    Types: Cigarettes    Quit date: 02/03/2009  . Smokeless tobacco: Never Used     Comment: Counseled to remain smoke free  . Alcohol Use: 0.0 oz/week    0 Standard drinks or equivalent per week     Comment: Beer & Wine occasionally.  . Drug Use: No  . Sexual Activity: Not Asked   Other Topics Concern  . None   Social History Narrative   Originally from [place]. Previously worked driving a Restaurant manager, fast food. No pets currently.  No mold exposure. 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       Objective:   Physical Exam BP 134/54 mmHg  Pulse 77  Temp(Src) 97.8 F (36.6 C) (Oral)  Ht 5' 5.5" (1.664 m)  Wt 146 lb (66.225 kg)  BMI 23.92 kg/m2  SpO2 95% General:  Awake. Alert. No distress. Wife present again today.  Integument:  Warm & dry. No rash on exposed skin. No bruising. HEENT:  Moist mucus membranes. No oral ulcers. No scleral injection.  Cardiovascular:  Regular rate. No edema. Normal S1 & S2. Pulmonary:  Symmetrically decreased breath sounds. Normal work of breathing on room air. Speaking in complete sentences. Abdomen: Soft. Normal  bowel sounds. Nontender.  PFT 05/29/16: FVC 2.08 L (86%) FEV1 0.83 L (31%) FEV1/FVC 0.40 FEF 25-75 0.30 L (14%) no bronchodilator response TLC 6.56 L (107%) RV 167% ERV 160% DLCO corrected 60% (hemoglobin 10.9) 07/15/13: FVC 2.68 L (60%) FEV1 1.63 L (53%) FEV1/FVC 0.61 FEF 25-75 0.76 L (26%) 02/04/12: FVC 2.18 L (57%) FEV1 1.09 L (36%) FEV1/FVC 0.50 FEF 25-75 0.33 L (11%)  6MWT 05/29/16:  Walked 516 meters / Baseline Sat 95% on RA / Nadir Sat 89% on RA @ end of test  IMAGING LD CT CHEST W/O 03/04/16 (previously reviewed by me): Moderate centrilobular emphysema. Scattered pulmonary nodules including a dominant irregular nodule right lower lobe 1.1 cm in maximal dimension. No pathologic mediastinal adenopathy. No pleural effusion. No pleural thickening. No pericardial effusion.  CXR PA/LAT 02/04/12 (per radiologist): Hyperinflation with opacities left greater than right. Pleural thickening versus small pleural effusion bilaterally left greater than right. Density in the major fissure on lateral likely reflecting fluid.  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 Patient spirometry today shows very severe airway obstruction with significant reduction in spirometry since 2014. He still exhibits no oxygen requirement with ambulation. I do believe he may benefit from switching to Spiriva Respimat off of the [employer]. We did discuss his potential need for oxygen therapy at higher elevations. He will undergo repeat CT imaging to continue to follow the lung nodule found in December. I instructed the patient contact my office if he had any new breathing problems before next appointment or questions.  1. Very, Severe COPD: Continuing Symbicort without any changes.Switching to Spiriva Respimat. 2. Chronic Nocturnal Hypoxic Respiratory Failure: Continuing oxygen as previously prescribed. Prescribing a portable  oxygen concentrator and repeating overnight oximetry on 2 L/m with portable concentrator. 3. Right Lower Lobe Nodule:  Plan for repeat chest CT w/o this month. 4. Health maintenance: Influenza vaccine September 2016, Prevnar January 2015, & Pneumovax September 2011. 5. Follow-up: Patient to return to clinic in 6 months or sooner if needed.  Sonia Baller Ashok Cordia, M.D. [facility] Pulmonary & Critical Care Pager:  [phone] After 3pm or if no response, call [phone] 11:55 AM 05/29/2016

## 2016-05-29 NOTE — Addendum Note (Signed)
Addended by: Raymondo Band D on: 05/29/2016 12:32 PM   Modules accepted: Orders, Medications

## 2016-05-29 NOTE — Progress Notes (Signed)
PFT done today. 

## 2016-05-29 NOTE — Progress Notes (Signed)
Test reviewed.  

## 2016-06-05 DIAGNOSIS — L570 Actinic keratosis: Secondary | ICD-10-CM | POA: Diagnosis not present

## 2016-06-07 ENCOUNTER — Ambulatory Visit (INDEPENDENT_AMBULATORY_CARE_PROVIDER_SITE_OTHER)
Admission: RE | Admit: 2016-06-07 | Discharge: 2016-06-07 | Disposition: A | Discharge status: Home / Self Care | Payer: Medicare Other | Source: Ambulatory Visit | Attending: Pulmonary Disease | Admitting: Pulmonary Disease

## 2016-06-07 DIAGNOSIS — J449 Chronic obstructive pulmonary disease, unspecified: Secondary | ICD-10-CM | POA: Diagnosis not present

## 2016-06-07 DIAGNOSIS — R911 Solitary pulmonary nodule: Secondary | ICD-10-CM | POA: Diagnosis not present

## 2016-06-07 DIAGNOSIS — J439 Emphysema, unspecified: Secondary | ICD-10-CM | POA: Diagnosis not present

## 2016-06-07 DIAGNOSIS — IMO0001 Reserved for inherently not codable concepts without codable children: Secondary | ICD-10-CM

## 2016-06-11 ENCOUNTER — Telehealth: Payer: Self-pay | Admitting: Pulmonary Disease

## 2016-06-11 DIAGNOSIS — R911 Solitary pulmonary nodule: Secondary | ICD-10-CM

## 2016-06-11 DIAGNOSIS — IMO0001 Reserved for inherently not codable concepts without codable children: Secondary | ICD-10-CM

## 2016-06-11 NOTE — Telephone Encounter (Signed)
Spoke with pt's wife, she and pt are very anxious to find out results of CT chest.  JN please advise on recs. Thanks.

## 2016-06-12 NOTE — Telephone Encounter (Signed)
Spoke with pt's wife, aware of results/recs. CT ordered.   Nothing further needed.

## 2016-06-12 NOTE — Telephone Encounter (Signed)
Please let the patient know that I reviewed his chest CT. There has been no change in the size of his right lower lobe lung nodule. We will plan on repeating a CT scan of the chest without contrast in December 2017 to continue to follow this nodule.  IMAGING CT CHEST W/O 06/07/16 (personally reviewed by me): 1.1 x 0.7 cm right lower lobe nodule unchanged since December 2016 CT scan. Apically predominant centrilobular emphysema noted. No new nodule or opacity appreciated. 10 mm precarinal lymph node with what appears to be a fatty hilum. No pleural effusion or thickening. No precordial effusion.

## 2016-06-13 LAB — PULMONARY FUNCTION TEST
FEF 25-75 PRE: 0.27 L/s
FEF2575-%PRED-PRE: 13 %
FEV1-%Pred-Pre: 29 %
FEV1-PRE: 0.78 L
FEV1FVC-%Pred-Pre: 55 %
FEV6-%Pred-Pre: 52 %
FEV6-PRE: 1.8 L
FEV6FVC-%Pred-Pre: 100 %
FVC-%PRED-PRE: 52 %
FVC-PRE: 1.92 L
PRE FEV6/FVC RATIO: 94 %
Pre FEV1/FVC ratio: 41 %

## 2016-06-24 ENCOUNTER — Telehealth: Payer: Self-pay | Admitting: Pulmonary Disease

## 2016-06-24 NOTE — Telephone Encounter (Signed)
Per 05/29/16: We will do an overnight oxygen test on your new portable oxygen concentrator at 2 L/m to make sure your oxygen is appropriate --  Spoke with Melissa from [facility]. She reports pt does not have a POC through them. Only has stationary. She wants to know if you still want to do the ONO using his stationary machine? Please advise thanks

## 2016-06-27 NOTE — Telephone Encounter (Signed)
See if they can switch his stationary concentrator to pulse to mimic his portable concentrator for the test. Thanks.

## 2016-06-28 NOTE — Telephone Encounter (Signed)
Received a message back from [place]:  Message  Received: Today    Melissa Stenson  Randa Spike, CMA           The concentrator is not capable of pulsed dose. No way to do ONO on pulsed.     JN - please advise. Thanks.

## 2016-06-28 NOTE — Telephone Encounter (Signed)
Guess there's no way to do it then if they won't do it with his concentrator. Thanks.

## 2016-06-28 NOTE — Telephone Encounter (Signed)
A staff message has been sent to [facility] to see if they can accommodate this. Will await her response.

## 2016-06-28 NOTE — Telephone Encounter (Signed)
Spoke with Melissa. Made her aware of this. Nothing further was needed.

## 2016-08-21 ENCOUNTER — Other Ambulatory Visit: Payer: Self-pay

## 2016-08-27 DIAGNOSIS — E782 Mixed hyperlipidemia: Secondary | ICD-10-CM | POA: Diagnosis not present

## 2016-08-27 DIAGNOSIS — R7301 Impaired fasting glucose: Secondary | ICD-10-CM | POA: Diagnosis not present

## 2016-08-29 DIAGNOSIS — Z23 Encounter for immunization: Secondary | ICD-10-CM | POA: Diagnosis not present

## 2016-08-29 DIAGNOSIS — J410 Simple chronic bronchitis: Secondary | ICD-10-CM | POA: Diagnosis not present

## 2016-08-29 DIAGNOSIS — R7301 Impaired fasting glucose: Secondary | ICD-10-CM | POA: Diagnosis not present

## 2016-08-29 DIAGNOSIS — E782 Mixed hyperlipidemia: Secondary | ICD-10-CM | POA: Diagnosis not present

## 2016-09-16 DIAGNOSIS — J449 Chronic obstructive pulmonary disease, unspecified: Secondary | ICD-10-CM | POA: Diagnosis not present

## 2016-10-28 DIAGNOSIS — Z86010 Personal history of colonic polyps: Secondary | ICD-10-CM | POA: Diagnosis not present

## 2016-10-28 DIAGNOSIS — Z1211 Encounter for screening for malignant neoplasm of colon: Secondary | ICD-10-CM | POA: Diagnosis not present

## 2016-10-28 DIAGNOSIS — K5730 Diverticulosis of large intestine without perforation or abscess without bleeding: Secondary | ICD-10-CM | POA: Diagnosis not present

## 2016-11-08 LAB — HM COLONOSCOPY

## 2016-11-21 ENCOUNTER — Telehealth: Payer: Self-pay | Admitting: Acute Care

## 2016-11-21 NOTE — Telephone Encounter (Signed)
Javier Glazier, MD  Magdalen Spatz, NP        Yes please continue with the CT Chest w/o in December as planned.  Thanks. We will readjust his screen CT scans depending on this result.   Previous Messages    ----- Message -----  From: Magdalen Spatz, NP  Sent: 11/04/2016  3:52 PM  To: Javier Glazier, MD  Subject: CT Chest and Lung cancer Screening        Dr. Ashok Cordia. This patient is due for annual lung cancer screening in March 2018. This is based on results of a screening scan done December 2016 which indicated a lung RADS 4A, 3 month follow-up indicated a lung RADS 2 with radiology suggested follow-up for March 2018. The patient has a CT ordered by you for 12/11/2016. This will push the follow-up screening scan to 11/2017. Please confirm that this is scheduled at you want for this patient screening CTs. It is my concern that if we get the screening CTs more frequently than once yearly the patient will be presented with a charge. Thank you.      Per above, the patient will be scanned out of the usual lung cancer screening sequence per Dr. Ammie Dalton order

## 2016-11-21 NOTE — Telephone Encounter (Deleted)
-----   Message from Javier Glazier, MD sent at 11/05/2016  5:12 PM EST ----- Regarding: RE: CT Chest and Lung cancer Screening Yes please continue with the CT Chest w/o in December as planned. Thanks. We will readjust his screen CT scans depending on this result. ----- Message ----- From: Magdalen Spatz, NP Sent: 11/04/2016   3:52 PM To: Javier Glazier, MD Subject: CT Chest and Lung cancer Screening             Dr. Ashok Cordia. This patient is due for annual lung cancer screening in March 2018. This is based on results of a screening scan done December 2016 which indicated a lung RADS 4A, 3 month follow-up indicated a lung RADS 2 with radiology suggested follow-up for March 2018. The patient has a CT ordered by you for 12/11/2016. This will push the follow-up screening scan to 11/2017. Please confirm that this is scheduled at you want for this patient screening CTs. It is my concern that if we get the screening CTs more frequently than once yearly the patient will be presented with a charge. Thank you.

## 2016-11-27 ENCOUNTER — Ambulatory Visit (INDEPENDENT_AMBULATORY_CARE_PROVIDER_SITE_OTHER)
Admission: RE | Admit: 2016-11-27 | Discharge: 2016-11-27 | Disposition: A | Discharge status: Home / Self Care | Payer: Medicare Other | Source: Ambulatory Visit | Attending: Pulmonary Disease | Admitting: Pulmonary Disease

## 2016-11-27 DIAGNOSIS — R911 Solitary pulmonary nodule: Secondary | ICD-10-CM

## 2016-11-27 DIAGNOSIS — R918 Other nonspecific abnormal finding of lung field: Secondary | ICD-10-CM | POA: Diagnosis not present

## 2016-11-27 DIAGNOSIS — IMO0001 Reserved for inherently not codable concepts without codable children: Secondary | ICD-10-CM

## 2016-11-27 DIAGNOSIS — J0100 Acute maxillary sinusitis, unspecified: Secondary | ICD-10-CM | POA: Diagnosis not present

## 2016-11-27 DIAGNOSIS — J449 Chronic obstructive pulmonary disease, unspecified: Secondary | ICD-10-CM | POA: Diagnosis not present

## 2016-12-02 ENCOUNTER — Ambulatory Visit (INDEPENDENT_AMBULATORY_CARE_PROVIDER_SITE_OTHER): Payer: Medicare Other | Admitting: Pulmonary Disease

## 2016-12-02 ENCOUNTER — Encounter: Payer: Self-pay | Admitting: Pulmonary Disease

## 2016-12-02 VITALS — BP 128/66 | HR 85 | Ht 65.5 in | Wt 147.0 lb

## 2016-12-02 DIAGNOSIS — J449 Chronic obstructive pulmonary disease, unspecified: Secondary | ICD-10-CM

## 2016-12-02 DIAGNOSIS — R918 Other nonspecific abnormal finding of lung field: Secondary | ICD-10-CM

## 2016-12-02 MED ORDER — BUDESONIDE-FORMOTEROL FUMARATE 160-4.5 MCG/ACT IN AERO: 2.0000 | INHALATION_SPRAY | Freq: Two times a day (BID) | RESPIRATORY_TRACT | 0 refills | Status: DC

## 2016-12-02 MED ORDER — TIOTROPIUM BROMIDE MONOHYDRATE 2.5 MCG/ACT IN AERS: 2.0000 | INHALATION_SPRAY | Freq: Every day | RESPIRATORY_TRACT | 0 refills | Status: DC

## 2016-12-02 NOTE — Patient Instructions (Signed)
   Call me if you have any new breathing problems before your next appointment.  I will see you back in 3 months or sooner if needed to review your chest CT scan.  TESTS ORDERED: 1. CT Chest w/o March 2018

## 2016-12-02 NOTE — Progress Notes (Signed)
Subjective:    Patient ID: Paul [name], male    DOB: [birth year], 70 y.o.   MRN: [id number]  C.C.:  Follow-up for Very, Severe COPD, Chronic Nocturnal Hypoxic Respiratory Failure, & Right Lower Lobe Nodule.  HPI Very, Severe COPD:  Prescribed Symbicort and Spiriva Respimat. Patient recently took a drip and developed a productive cough. His PCP started him on an antibiotic and he has an IM injection of Kenalog. Reports he is back to his baseline cough with "thick, white" mucus. No audible wheezing. He feels his breathing is doing "good overall". He reports he does feel the respimat inhaler is working better. He is using his rescue inhaler with golf or yard work.   Chronic Nocturnal Hypoxic Respiratory Failure:  Using oxygen at 2 L/m while sleeping. No evidence of oxygen requirement with ambulation on 6 minute walk test tested previously. Prescribed a portable oxygen concentrator at last appointment.  Right Lower Lobe Nodule:  Noted on low dose chest CT imaging measuring 1.1 cm December 2016. Patient has 2 new nodules in his left lung on chest CT 11/27/16.  Review of Systems No fever, chills, or sweats. No chest pain or pressure. No dysphagia or odynophagia. No nausea.   No Known Allergies  Current Outpatient Prescriptions on File Prior to Visit  Medication Sig Dispense Refill  . albuterol (PROVENTIL HFA;VENTOLIN HFA) 108 (90 BASE) MCG/ACT inhaler Inhale 2 puffs into the lungs every 4 (four) hours as needed for wheezing or shortness of breath.    . Ascorbic Acid (VITAMIN C PO) Take by mouth daily.    . budesonide-formoterol (SYMBICORT) 160-4.5 MCG/ACT inhaler Inhale 2 puffs into the lungs 2 (two) times daily.    . fish oil-omega-3 fatty acids 1000 MG capsule Take 2 g by mouth daily.    Marland Kitchen loratadine (CLARITIN) 10 MG tablet Take 10 mg by mouth daily.    . Multiple Vitamins-Minerals (MULTIVITAMIN PO) Take by mouth daily.    Marland Kitchen Respiratory Therapy Supplies (FLUTTER) DEVI Use 2-3 times daily 1 each  0  . simvastatin (ZOCOR) 40 MG tablet Take 20 mg by mouth every evening.    . Tiotropium Bromide Monohydrate (SPIRIVA RESPIMAT) 2.5 MCG/ACT AERS Inhale 2 puffs into the lungs daily. 1 Inhaler 11   No current facility-administered medications on file prior to visit.     Past Medical History:  Diagnosis Date  . COPD (chronic obstructive pulmonary disease) ([place])   . High cholesterol     Past Surgical History:  Procedure Laterality Date  . CATARACT EXTRACTION Right   . SKIN SURGERY     shoulders and chest    Family History  Problem Relation Age of Onset  . Heart disease Brother   . Heart disease Mother   . Heart disease Father   . Cancer Paternal Uncle     Social History   Social History  . Marital status: Married    Spouse name: N/A  . Number of children: 3  . Years of education: N/A   Occupational History  . retired     Administrator   Social History Main Topics  . Smoking status: Former Smoker    Packs/day: 2.00    Years: 52.00    Types: Cigarettes    Quit date: 02/03/2009  . Smokeless tobacco: Never Used     Comment: Counseled to remain smoke free  . Alcohol use 0.0 oz/week     Comment: Beer & Wine occasionally.  . Drug use: No  . Sexual activity:  Not Asked   Other Topics Concern  . None   Social History Narrative   Originally from [place]. Previously worked driving a Restaurant manager, fast food. No pets currently. No mold exposure. 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       Objective:   Physical Exam BP 128/66 (BP Location: Left Arm, Cuff Size: Normal)   Pulse 85   Ht 5' 5.5" (1.664 m)   Wt 147 lb (66.7 kg)   SpO2 99%   BMI 24.09 kg/m  General:  Awake. No distress. Wife present again today.  Integument:  Warm & dry. No rash on exposed skin.  HEENT:  Moist mucus membranes. No oral ulcers. No scleral injection.  Cardiovascular:  Regular rate. No edema. Normal S1 & S2. Pulmonary:  Normal work of  breathing on room air. Clear on auscultation. Symmetrically decreased breath sounds. Abdomen: Soft. Normal bowel sounds. Nontender.  PFT 05/29/16: FVC 2.08 L (86%) FEV1 0.83 L (31%) FEV1/FVC 0.40 FEF 25-75 0.30 L (14%) no bronchodilator response TLC 6.56 L (107%) RV 167% ERV 160% DLCO corrected 60% (hemoglobin 10.9) 07/15/13: FVC 2.68 L (60%) FEV1 1.63 L (53%) FEV1/FVC 0.61 FEF 25-75 0.76 L (26%) 02/04/12: FVC 2.18 L (57%) FEV1 1.09 L (36%) FEV1/FVC 0.50 FEF 25-75 0.33 L (11%)  6MWT 05/29/16:  Walked 516 meters / Baseline Sat 95% on RA / Nadir Sat 89% on RA @ end of test  IMAGING CT CHEST W/O 11/27/16 (personally reviewed by me with the patient):  New 4 mm left upper lobe nodule & 7 mm right upper lobe nodule. Calcified nodule in right upper lobe as well. Nodule within right lower lobe with some calcification and unchanged. Moderate centrilobular emphysema upper lobe predominant unchanged. No new developing mass. No pathologic mediastinal adenopathy. No pericardial effusion. No pleural effusion or thickening.  LD CT CHEST W/O 03/04/16 (previously reviewed by me): Moderate centrilobular emphysema. Scattered pulmonary nodules including a dominant irregular nodule right lower lobe 1.1 cm in maximal dimension. No pathologic mediastinal adenopathy. No pleural effusion. No pleural thickening. No pericardial effusion.  CXR PA/LAT 02/04/12 (per radiologist): Hyperinflation with opacities left greater than right. Pleural thickening versus small pleural effusion bilaterally left greater than right. Density in the major fissure on lateral likely reflecting fluid.  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 Caucasian male with very, severe COPD, chronic nocturnal hypoxic respiratory failure, & a right lower lobe nodule.Patient has 2 new lung nodules on his CT imaging performed recently which is likely a result of his recent upper respiratory tract infection. Given the size of the  right upper lobe nodule and his history of tobacco use close follow-up CT imaging will be performed at 3 months. He seems to be recovering from his acute bronchitis well. I instructed the patient to contact my office if he had any new breathing problems or questions before his next appointment as I would be happy to see him sooner.  1. Very, Severe COPD: Continuing patient on Spiriva and Symbicort as prescribed. Patient given samples today and paper work for prescription drug assistance. 2. Chronic Nocturnal Hypoxic Respiratory Failure: Prescribed a portable oxygen concentrator & 2 L/m with portable concentrator. 3. Multiple Lung Nodules:  Plan for repeat chest CT w/o March 2018. 4. Health Maintenance: Influenza vaccine September 2017, Prevnar January 2015, & Pneumovax September 2011. 5. Follow-up: Patient to return to clinic in 3 month or  sooner if needed.  Sonia Baller Ashok Cordia, M.D. [facility] Pulmonary & Critical Care Pager:  [phone] After 3pm or if no response, call [phone] 9:06 AM 12/02/16

## 2016-12-02 NOTE — Addendum Note (Signed)
Addended by: Len Blalock on: 12/02/2016 09:25 AM   Modules accepted: Orders

## 2016-12-02 NOTE — Addendum Note (Signed)
Addended by: Len Blalock on: 12/02/2016 09:22 AM   Modules accepted: Orders

## 2016-12-03 DIAGNOSIS — R7301 Impaired fasting glucose: Secondary | ICD-10-CM | POA: Diagnosis not present

## 2016-12-03 DIAGNOSIS — Z961 Presence of intraocular lens: Secondary | ICD-10-CM | POA: Diagnosis not present

## 2016-12-03 DIAGNOSIS — E782 Mixed hyperlipidemia: Secondary | ICD-10-CM | POA: Diagnosis not present

## 2016-12-05 DIAGNOSIS — D235 Other benign neoplasm of skin of trunk: Secondary | ICD-10-CM | POA: Diagnosis not present

## 2016-12-05 DIAGNOSIS — L821 Other seborrheic keratosis: Secondary | ICD-10-CM | POA: Diagnosis not present

## 2016-12-05 DIAGNOSIS — L570 Actinic keratosis: Secondary | ICD-10-CM | POA: Diagnosis not present

## 2016-12-06 DIAGNOSIS — R7301 Impaired fasting glucose: Secondary | ICD-10-CM | POA: Diagnosis not present

## 2016-12-06 DIAGNOSIS — E782 Mixed hyperlipidemia: Secondary | ICD-10-CM | POA: Diagnosis not present

## 2016-12-06 DIAGNOSIS — J410 Simple chronic bronchitis: Secondary | ICD-10-CM | POA: Diagnosis not present

## 2017-01-13 DIAGNOSIS — Z0000 Encounter for general adult medical examination without abnormal findings: Secondary | ICD-10-CM | POA: Diagnosis not present

## 2017-01-13 DIAGNOSIS — Z6823 Body mass index (BMI) 23.0-23.9, adult: Secondary | ICD-10-CM | POA: Diagnosis not present

## 2017-01-20 ENCOUNTER — Telehealth: Payer: Self-pay | Admitting: Pulmonary Disease

## 2017-01-20 MED ORDER — TIOTROPIUM BROMIDE MONOHYDRATE 2.5 MCG/ACT IN AERS: 2.0000 | INHALATION_SPRAY | Freq: Every day | RESPIRATORY_TRACT | 0 refills | Status: DC

## 2017-01-20 MED ORDER — BUDESONIDE-FORMOTEROL FUMARATE 160-4.5 MCG/ACT IN AERO: 2.0000 | INHALATION_SPRAY | Freq: Two times a day (BID) | RESPIRATORY_TRACT | 0 refills | Status: DC

## 2017-01-20 NOTE — Telephone Encounter (Signed)
Pt. Came to office today wanting to see if JN could sign his hanidcap placard. Informed the pt. That JN would not be in the office the rest of the week but would be here next week.  Nothing further is needed at this time.   Also pt. Requested samples of Spiriva and Symbicort, samples were given to pt.

## 2017-01-23 DIAGNOSIS — J069 Acute upper respiratory infection, unspecified: Secondary | ICD-10-CM | POA: Diagnosis not present

## 2017-01-23 DIAGNOSIS — J449 Chronic obstructive pulmonary disease, unspecified: Secondary | ICD-10-CM | POA: Diagnosis not present

## 2017-01-30 ENCOUNTER — Encounter (HOSPITAL_COMMUNITY): Payer: Self-pay | Admitting: Emergency Medicine

## 2017-01-30 ENCOUNTER — Emergency Department (HOSPITAL_COMMUNITY): Payer: Medicare Other

## 2017-01-30 DIAGNOSIS — Z87891 Personal history of nicotine dependence: Secondary | ICD-10-CM | POA: Diagnosis not present

## 2017-01-30 DIAGNOSIS — J09X2 Influenza due to identified novel influenza A virus with other respiratory manifestations: Secondary | ICD-10-CM | POA: Diagnosis not present

## 2017-01-30 DIAGNOSIS — R0602 Shortness of breath: Secondary | ICD-10-CM | POA: Diagnosis not present

## 2017-01-30 DIAGNOSIS — Z79899 Other long term (current) drug therapy: Secondary | ICD-10-CM | POA: Insufficient documentation

## 2017-01-30 DIAGNOSIS — R509 Fever, unspecified: Secondary | ICD-10-CM | POA: Diagnosis present

## 2017-01-30 DIAGNOSIS — R05 Cough: Secondary | ICD-10-CM | POA: Diagnosis not present

## 2017-01-30 DIAGNOSIS — J449 Chronic obstructive pulmonary disease, unspecified: Secondary | ICD-10-CM | POA: Insufficient documentation

## 2017-01-30 LAB — BASIC METABOLIC PANEL
ANION GAP: 8 (ref 5–15)
BUN: 15 mg/dL (ref 6–20)
CHLORIDE: 103 mmol/L (ref 101–111)
CO2: 24 mmol/L (ref 22–32)
CREATININE: 0.87 mg/dL (ref 0.61–1.24)
Calcium: 9.2 mg/dL (ref 8.9–10.3)
GFR calc Af Amer: >60 mL/min (ref >60)
GFR calc non Af Amer: >60 mL/min (ref >60)
Glucose, Bld: 119 mg/dL — ABNORMAL HIGH (ref 65–99)
Potassium: 3.9 mmol/L (ref 3.5–5.1)
SODIUM: 135 mmol/L (ref 135–145)

## 2017-01-30 LAB — CBC WITH DIFFERENTIAL/PLATELET
BASOS ABS: 0 10*3/uL (ref 0.0–0.1)
Basophils Relative: 0 %
EOS ABS: 0.6 10*3/uL (ref 0.0–0.7)
Eosinophils Relative: 4 %
HEMATOCRIT: 40.9 % (ref 39.0–52.0)
HEMOGLOBIN: 12.9 g/dL — AB (ref 13.0–17.0)
Lymphocytes Relative: 14 %
Lymphs Abs: 1.9 10*3/uL (ref 0.7–4.0)
MCH: 25.2 pg — ABNORMAL LOW (ref 26.0–34.0)
MCHC: 31.5 g/dL (ref 30.0–36.0)
MCV: 79.9 fL (ref 78.0–100.0)
MONOS PCT: 6 %
Monocytes Absolute: 0.8 10*3/uL (ref 0.1–1.0)
NEUTROS ABS: 10.8 10*3/uL — AB (ref 1.7–7.7)
NEUTROS PCT: 76 %
Platelets: 259 10*3/uL (ref 150–400)
RBC: 5.12 MIL/uL (ref 4.22–5.81)
RDW: 15.2 % (ref 11.5–15.5)
WBC: 14.1 10*3/uL — AB (ref 4.0–10.5)

## 2017-01-30 LAB — URINALYSIS, ROUTINE W REFLEX MICROSCOPIC
BILIRUBIN URINE: NEGATIVE
GLUCOSE, UA: 50 mg/dL — AB
HGB URINE DIPSTICK: NEGATIVE
KETONES UR: NEGATIVE mg/dL
Leukocytes, UA: NEGATIVE
Nitrite: NEGATIVE
PH: 5 (ref 5.0–8.0)
Protein, ur: NEGATIVE mg/dL
SPECIFIC GRAVITY, URINE: 1.026 (ref 1.005–1.030)

## 2017-01-30 NOTE — ED Triage Notes (Signed)
Pt. reports fever today , he took Tylenol / no fever at arrival , he saw his PCP last Friday prescribed with Zithromax and received steroid injection .

## 2017-01-31 ENCOUNTER — Emergency Department (HOSPITAL_COMMUNITY)
Admission: EM | Admit: 2017-01-31 | Discharge: 2017-01-31 | Disposition: A | Discharge status: Home / Self Care | Payer: Medicare Other | Attending: Emergency Medicine | Admitting: Emergency Medicine

## 2017-01-31 DIAGNOSIS — J09X2 Influenza due to identified novel influenza A virus with other respiratory manifestations: Secondary | ICD-10-CM | POA: Diagnosis not present

## 2017-01-31 DIAGNOSIS — J101 Influenza due to other identified influenza virus with other respiratory manifestations: Secondary | ICD-10-CM

## 2017-01-31 LAB — INFLUENZA PANEL BY PCR (TYPE A & B)
INFLAPCR: POSITIVE — AB
INFLBPCR: NEGATIVE

## 2017-01-31 MED ORDER — OSELTAMIVIR PHOSPHATE 75 MG PO CAPS: 75.0000 mg | ORAL_CAPSULE | Freq: Two times a day (BID) | ORAL | 0 refills | Status: DC

## 2017-01-31 MED ORDER — OSELTAMIVIR PHOSPHATE 75 MG PO CAPS
75.0000 mg | ORAL_CAPSULE | Freq: Once | ORAL | Status: AC
Start: 2017-01-31 — End: 2017-01-31
  Administered 2017-01-31: 75 mg via ORAL
  Filled 2017-01-31: qty 1

## 2017-01-31 NOTE — Discharge Instructions (Signed)
Read the information below.  Use the prescribed medication as directed.  Please discuss all new medications with your pharmacist.  You may return to the Emergency Department at any time for worsening condition or any new symptoms that concern you.   If you develop worsening shortness of breath, uncontrolled wheezing, severe chest pain, or fevers despite using tylenol and/or ibuprofen, return for a recheck.     °

## 2017-01-31 NOTE — ED Provider Notes (Signed)
[facility] DEPT Provider Note   CSN: [id number] Arrival date & time: 01/30/17  2257     History   Chief Complaint Chief Complaint  Patient presents with  . Fever    HPI Paul [name] is a 71 y.o. male.  HPI   Pt with hx COPD p/w hypoxia and fever today.  States he has had a "bad cough" productive of thick white sputum, was placed on z-pak and given steroid injection 6 days ago.  Today his O2 on room air was 88% so he used his home oxygen (usually only needs it at night) and had fever to 101.8.  Denies nasal congestion/rhinorrhea, sore throat, CP, SOB, abdominal pain, N/V/D, urinary symptoms, skin rash or infections.    Past Medical History:  Diagnosis Date  . COPD (chronic obstructive pulmonary disease) ([place])   . High cholesterol     Patient Active Problem List   Diagnosis Date Noted  . Lung nodule < 6cm on CT 05/29/2016  . Former cigarette smoker 12/05/2015  . Chronic respiratory failure with hypoxia ([place]) 01/27/2013  . COPD (chronic obstructive pulmonary disease), gold stage C.   . High cholesterol     Past Surgical History:  Procedure Laterality Date  . CATARACT EXTRACTION Right   . SKIN SURGERY     shoulders and chest       Home Medications    Prior to Admission medications   Medication Sig Start Date End Date Taking? Authorizing Provider  albuterol (PROVENTIL HFA;VENTOLIN HFA) 108 (90 BASE) MCG/ACT inhaler Inhale 2 puffs into the lungs every 4 (four) hours as needed for wheezing or shortness of breath. 02/04/12  Yes Elsie Stain, MD  budesonide-formoterol [facility]) 160-4.5 MCG/ACT inhaler Inhale 2 puffs into the lungs 2 (two) times daily. 01/20/17  Yes Javier Glazier, MD  fish oil-omega-3 fatty acids 1000 MG capsule Take 2 g by mouth daily.   Yes Historical Provider, MD  loratadine (CLARITIN) 10 MG tablet Take 10 mg by mouth daily.   Yes Historical Provider, MD  Multiple Vitamin (MULTIVITAMIN WITH MINERALS) TABS tablet Take 1 tablet by mouth daily.    Yes Historical Provider, MD  simvastatin (ZOCOR) 40 MG tablet Take 20 mg by mouth every evening.   Yes Historical Provider, MD  Tiotropium Bromide Monohydrate (SPIRIVA RESPIMAT) 2.5 MCG/ACT AERS Inhale 2 puffs into the lungs daily. 01/20/17  Yes Javier Glazier, MD  budesonide-formoterol [facility]) 160-4.5 MCG/ACT inhaler Inhale 2 puffs into the lungs 2 (two) times daily. Patient not taking: Reported on 01/31/2017 12/02/16   Javier Glazier, MD  oseltamivir (TAMIFLU) 75 MG capsule Take 1 capsule (75 mg total) by mouth 2 (two) times daily. 01/31/17   Clayton Bibles, PA-C  Respiratory Therapy Supplies (FLUTTER) DEVI Use 2-3 times daily 07/04/15   Elsie Stain, MD  Tiotropium Bromide Monohydrate (SPIRIVA RESPIMAT) 2.5 MCG/ACT AERS Inhale 2 puffs into the lungs daily. Patient not taking: Reported on 01/31/2017 05/29/16   Javier Glazier, MD    Family History Family History  Problem Relation Age of Onset  . Heart disease Brother   . Heart disease Mother   . Heart disease Father   . Cancer Paternal Uncle     Social History Social History  Substance Use Topics  . Smoking status: Former Smoker    Packs/day: 2.00    Years: 52.00    Types: Cigarettes    Quit date: 02/03/2009  . Smokeless tobacco: Never Used     Comment: Counseled to remain smoke  free  . Alcohol use 0.0 oz/week     Comment: Beer & Wine occasionally.     Allergies   Patient has no known allergies.   Review of Systems Review of Systems  All other systems reviewed and are negative.    Physical Exam Updated Vital Signs BP 132/74 (BP Location: Right Arm)   Pulse 92   Temp 98.8 F (37.1 C) (Oral)   Resp 18   Ht 5\' 6"  (1.676 m)   Wt 64.4 kg   SpO2 99%   BMI 22.92 kg/m   Physical Exam  Constitutional: He appears well-developed and well-nourished. No distress.  HENT:  Head: Normocephalic and atraumatic.  Neck: Neck supple.  Cardiovascular: Normal rate and regular rhythm.   Pulmonary/Chest: Effort normal and  breath sounds normal. No respiratory distress. He has no rales.  Slight end expiratory wheezes in the upper lung fields.    Abdominal: Soft. He exhibits no distension and no mass. There is no tenderness. There is no rebound and no guarding.  Neurological: He is alert. He exhibits normal muscle tone.  Skin: He is not diaphoretic.  Nursing note and vitals reviewed.    ED Treatments / Results  Labs (all labs ordered are listed, but only abnormal results are displayed) Labs Reviewed  CBC WITH DIFFERENTIAL/PLATELET - Abnormal; Notable for the following:       Result Value   WBC 14.1 (*)    Hemoglobin 12.9 (*)    MCH 25.2 (*)    Neutro Abs 10.8 (*)    All other components within normal limits  BASIC METABOLIC PANEL - Abnormal; Notable for the following:    Glucose, Bld 119 (*)    All other components within normal limits  URINALYSIS, ROUTINE W REFLEX MICROSCOPIC - Abnormal; Notable for the following:    Glucose, UA 50 (*)    All other components within normal limits  INFLUENZA PANEL BY PCR (TYPE A & B) - Abnormal; Notable for the following:    Influenza A By PCR POSITIVE (*)    All other components within normal limits    EKG  EKG Interpretation None       Radiology Dg Chest 2 View  Result Date: 01/30/2017 CLINICAL DATA:  Cough, shortness of breath, and fever for 2 days. History of COPD. EXAM: CHEST  2 VIEW COMPARISON:  CT chest 11/27/2016.  Chest 02/04/2012 FINDINGS: Emphysematous changes and scattered fibrosis in the lungs. Peribronchial thickening consistent with chronic bronchitis. No focal airspace disease or consolidation in the lungs. Prominent cardiac fat pad. Normal heart size and pulmonary vascularity. No blunting of costophrenic angles. No pneumothorax. Calcification of the aorta. Degenerative changes in the spine. Colonic interposition under the right hemidiaphragm. IMPRESSION: Emphysematous and chronic bronchitic changes in the lungs. No evidence of active infiltration.  Electronically Signed   By: Lucienne Capers M.D.   On: 01/30/2017 23:42    Procedures Procedures (including critical care time)  Medications Ordered in ED Medications  oseltamivir (TAMIFLU) capsule 75 mg (75 mg Oral Given 01/31/17 0408)     Initial Impression / Assessment and Plan / ED Course  I have reviewed the triage vital signs and the nursing notes.  Pertinent labs & imaging results that were available during my care of the patient were reviewed by me and considered in my medical decision making (see chart for details).     Afebrile nontoxic patient with COPD presents with reported incidence of hypoxia at home and fever at home.  He has  no fever or hypoxia here.  Denies CP and SOB.  Influenza is positive.  CXR negative for infiltrate.  Pt has just finished z-pak.  Pt also seen by Dr Stark Jock.  Pt is eager for discharge.  D/C home with tamiflu, PCP follow up.  Discussed result, findings, treatment, and follow up  with patient.  Pt given return precautions.  Pt verbalizes understanding and agrees with plan.      Final Clinical Impressions(s) / ED Diagnoses   Final diagnoses:  Influenza A    New Prescriptions Discharge Medication List as of 01/31/2017  4:08 AM    START taking these medications   Details  oseltamivir (TAMIFLU) 75 MG capsule Take 1 capsule (75 mg total) by mouth 2 (two) times daily., Starting Fri 01/31/2017, Print         [place], PA-C 01/31/17 [id number]    Veryl Speak, MD 01/31/17 [phone]

## 2017-02-14 ENCOUNTER — Telehealth: Payer: Self-pay | Admitting: Pulmonary Disease

## 2017-02-14 NOTE — Telephone Encounter (Signed)
Will forward message to [facility] to follow up with multiple paperwork that has been left for JN to complete.  thanks

## 2017-02-17 NOTE — Telephone Encounter (Signed)
Did not have an opportunity to look at the forms and have JN signed them during clinic on 02/14/17. Will forward to Jn nurse just to be aware of

## 2017-02-18 NOTE — Telephone Encounter (Signed)
Forwarding to JN's nurse

## 2017-02-20 ENCOUNTER — Ambulatory Visit (INDEPENDENT_AMBULATORY_CARE_PROVIDER_SITE_OTHER)
Admission: RE | Admit: 2017-02-20 | Discharge: 2017-02-20 | Disposition: A | Discharge status: Home / Self Care | Payer: Medicare Other | Source: Ambulatory Visit | Attending: Pulmonary Disease | Admitting: Pulmonary Disease

## 2017-02-20 DIAGNOSIS — R918 Other nonspecific abnormal finding of lung field: Secondary | ICD-10-CM | POA: Diagnosis not present

## 2017-02-20 NOTE — Telephone Encounter (Signed)
Spoke with wife and informed her that Dr. Ashok Cordia will not be in the office until Monday 02/24/17 and that we will contact them once he has competed the forms. Nothing further is needed at this time.  Forwarding to JN's nurse as a reminder

## 2017-02-20 NOTE — Telephone Encounter (Signed)
lmtcb x1 for pt. 

## 2017-02-25 ENCOUNTER — Telehealth: Payer: Self-pay | Admitting: Pulmonary Disease

## 2017-02-25 NOTE — Telephone Encounter (Signed)
Paul [name] has this form been completed? Thanks.

## 2017-02-25 NOTE — Telephone Encounter (Signed)
A fax was sent to [place] at [phone] containing patient application for prescription savings program. A fax was also sent to [employer] at [phone] for patient assistant program. Nothing further is needed.

## 2017-02-25 NOTE — Telephone Encounter (Signed)
Spoke with patient and informed him his placard form was signed and is ready for pick up. He states he will pick the form up when he comes for his appointment on the March 13. Nothing further is needed.

## 2017-03-04 ENCOUNTER — Encounter: Payer: Self-pay | Admitting: Pulmonary Disease

## 2017-03-04 ENCOUNTER — Ambulatory Visit (INDEPENDENT_AMBULATORY_CARE_PROVIDER_SITE_OTHER): Payer: Medicare Other | Admitting: Pulmonary Disease

## 2017-03-04 VITALS — BP 120/60 | HR 97 | Ht 66.0 in | Wt 145.2 lb

## 2017-03-04 DIAGNOSIS — R918 Other nonspecific abnormal finding of lung field: Secondary | ICD-10-CM | POA: Diagnosis not present

## 2017-03-04 DIAGNOSIS — Z87891 Personal history of nicotine dependence: Secondary | ICD-10-CM

## 2017-03-04 DIAGNOSIS — G4734 Idiopathic sleep related nonobstructive alveolar hypoventilation: Secondary | ICD-10-CM

## 2017-03-04 DIAGNOSIS — J449 Chronic obstructive pulmonary disease, unspecified: Secondary | ICD-10-CM

## 2017-03-04 MED ORDER — TIOTROPIUM BROMIDE MONOHYDRATE 2.5 MCG/ACT IN AERS: 2.0000 | INHALATION_SPRAY | Freq: Every day | RESPIRATORY_TRACT | 0 refills | Status: DC

## 2017-03-04 NOTE — Patient Instructions (Signed)
   Call me if you have any new breathing problems before your next appointment.  I will see you back in 6 months or sooner if needed.  TESTS ORDERED: 1. CT CHEST W/O December 2018

## 2017-03-04 NOTE — Progress Notes (Signed)
Subjective:    Patient ID: Paul [name], male    DOB: [birth year], 71 y.o.   MRN: [id number]  C.C.:  Follow-up for Very, Severe COPD, Chronic Nocturnal Hypoxic Respiratory Failure, & Right Lower Lobe Nodule.  HPI Very severe COPD: Currently prescribed Symbicort and Spiriva. He reports no change in his baseline dyspnea. He has his baseline, rarely productive cough. No wheezing.   Chronic nocturnal hypoxic respiratory failure: Prescribed oxygen at 2 L/m while sleeping. Continues to use oxygen as prescribed.   Multiple lung nodules: Initially noted on low dose chest CT imaging in December 2016. Patient had 2 new nodules in his left lung on December 2017 CT imaging. Patient was again found to have multiple bilateral subcentimeter nodules but also a nodular infiltrate within his left lower lobe consistent with his fairly recent influenza A infection from early to mid February.  Review of Systems No chest pain or pressure. No fever or chills. No abdominal pain or nausea.   No Known Allergies  Current Outpatient Prescriptions on File Prior to Visit  Medication Sig Dispense Refill  . albuterol (PROVENTIL HFA;VENTOLIN HFA) 108 (90 BASE) MCG/ACT inhaler Inhale 2 puffs into the lungs every 4 (four) hours as needed for wheezing or shortness of breath.    . budesonide-formoterol (SYMBICORT) 160-4.5 MCG/ACT inhaler Inhale 2 puffs into the lungs 2 (two) times daily. 2 Inhaler 0  . fish oil-omega-3 fatty acids 1000 MG capsule Take 2 g by mouth daily.    Marland Kitchen loratadine (CLARITIN) 10 MG tablet Take 10 mg by mouth daily.    . Multiple Vitamin (MULTIVITAMIN WITH MINERALS) TABS tablet Take 1 tablet by mouth daily.    Marland Kitchen Respiratory Therapy Supplies (FLUTTER) DEVI Use 2-3 times daily 1 each 0  . simvastatin (ZOCOR) 40 MG tablet Take 20 mg by mouth every evening.    . Tiotropium Bromide Monohydrate (SPIRIVA RESPIMAT) 2.5 MCG/ACT AERS Inhale 2 puffs into the lungs daily. 1 Inhaler 0  . budesonide-formoterol  (SYMBICORT) 160-4.5 MCG/ACT inhaler Inhale 2 puffs into the lungs 2 (two) times daily. (Patient not taking: Reported on 03/04/2017) 1 Inhaler 0  . oseltamivir (TAMIFLU) 75 MG capsule Take 1 capsule (75 mg total) by mouth 2 (two) times daily. (Patient not taking: Reported on 03/04/2017) 10 capsule 0  . Tiotropium Bromide Monohydrate (SPIRIVA RESPIMAT) 2.5 MCG/ACT AERS Inhale 2 puffs into the lungs daily. (Patient not taking: Reported on 03/04/2017) 1 Inhaler 11   No current facility-administered medications on file prior to visit.     Past Medical History:  Diagnosis Date  . COPD (chronic obstructive pulmonary disease) ([place])   . High cholesterol     Past Surgical History:  Procedure Laterality Date  . CATARACT EXTRACTION Right   . SKIN SURGERY     shoulders and chest    Family History  Problem Relation Age of Onset  . Heart disease Brother   . Heart disease Mother   . Heart disease Father   . Cancer Paternal Uncle     Social History   Social History  . Marital status: Married    Spouse name: N/A  . Number of children: 3  . Years of education: N/A   Occupational History  . retired     Administrator   Social History Main Topics  . Smoking status: Former Smoker    Packs/day: 2.00    Years: 52.00    Types: Cigarettes    Quit date: 02/03/2009  . Smokeless tobacco: Never Used  Comment: Counseled to remain smoke free  . Alcohol use 0.0 oz/week     Comment: Beer & Wine occasionally.  . Drug use: No  . Sexual activity: Not Asked   Other Topics Concern  . None   Social History Narrative   Originally from [place]. Previously worked driving a Restaurant manager, fast food. No pets currently. No mold exposure. 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       Objective:   Physical Exam BP 120/60 (BP Location: Right Arm, Patient Position: Sitting, Cuff Size: Normal)   Pulse 97   Ht 5\' 6"  (1.676 m)   Wt 145 lb 3.2 oz (65.9 kg)    SpO2 97%   BMI 23.44 kg/m   General:  No distress. Wife with patient. Alert. Integument:  Warm & dry. No rash or bruising on exposed skin. Pulmonary:  Clear with auscultation. Normal work of breathing on room air.  Abdomen:  Soft. Nontender. Normal bowel sounds. Cardiovascular:  No edema. No JVD. Regular rate & rhythm.   PFT 05/29/16: FVC 2.08 L (86%) FEV1 0.83 L (31%) FEV1/FVC 0.40 FEF 25-75 0.30 L (14%) no bronchodilator response TLC 6.56 L (107%) RV 167% ERV 160% DLCO corrected 60% (hemoglobin 10.9) 07/15/13: FVC 2.68 L (60%) FEV1 1.63 L (53%) FEV1/FVC 0.61 FEF 25-75 0.76 L (26%) 02/04/12: FVC 2.18 L (57%) FEV1 1.09 L (36%) FEV1/FVC 0.50 FEF 25-75 0.33 L (11%)  6MWT 05/29/16:  Walked 516 meters / Baseline Sat 95% on RA / Nadir Sat 89% on RA @ end of test  IMAGING LD CHEST CT W/O 02/20/17 (personally reviewed by me): Multiple bilateral nodules and particularly a nodular infiltrate within the left lower lobe. No pleural effusion or thickening. No pathologic mediastinal adenopathy. Largest lymph node precarinal measures under 1 cm and has fatty hilum. No pericardial effusion. Apical predominant emphysematous changes noted.  CT CHEST W/O 11/27/16 (previously reviewed by me with the patient):  New 4 mm left upper lobe nodule & 7 mm right upper lobe nodule. Calcified nodule in right upper lobe as well. Nodule within right lower lobe with some calcification and unchanged. Moderate centrilobular emphysema upper lobe predominant unchanged. No new developing mass. No pathologic mediastinal adenopathy. No pericardial effusion. No pleural effusion or thickening.  LD CT CHEST W/O 03/04/16 (previously reviewed by me): Moderate centrilobular emphysema. Scattered pulmonary nodules including a dominant irregular nodule right lower lobe 1.1 cm in maximal dimension. No pathologic mediastinal adenopathy. No pleural effusion. No pleural thickening. No pericardial effusion.  CXR PA/LAT 02/04/12 (per radiologist):  Hyperinflation with opacities left greater than right. Pleural thickening versus small pleural effusion bilaterally left greater than right. Density in the major fissure on lateral likely reflecting fluid.  LABS 10/01/13 Alpha-1 antitrypsin: MM     Assessment & Plan:  71 y.o. male with a very severe COPD, chronic nocturnal hypoxic respiratory failure, & multiple lung nodules. Reviewed the patient's CT imaging with both he and his wife today. The nodular infiltrate within his left lower lobe is likely reflective of his previous influenza pneumonia. He seems to recover well and has minimal to no symptoms from his underlying COPD. I encouraged the patient to continue to remain active and contact me if he had any new breathing problems or questions before next appointment.  1. Very severe COPD:  Continuing on Symbicort and Spiriva. No changes. 2. Chronic nocturnal hypoxic respiratory failure:  Continuing on oxygen at night while sleeping as  prescribed. 3. Multiple lung nodules: Repeat CT chest without contrast low dose in December 2018. 4. Health maintenance: Status post influenza vaccine September 2017, Prevnar January 2015, & Pneumovax September 2011. 5. Follow-up: Return to clinic in 6 months or sooner if needed.   Sonia Baller Ashok Cordia, M.D. [facility] Pulmonary & Critical Care Pager:  [phone] After 3pm or if no response, call [phone] 2:14 PM 03/04/17

## 2017-03-06 DIAGNOSIS — R7301 Impaired fasting glucose: Secondary | ICD-10-CM | POA: Diagnosis not present

## 2017-03-06 DIAGNOSIS — E782 Mixed hyperlipidemia: Secondary | ICD-10-CM | POA: Diagnosis not present

## 2017-03-11 DIAGNOSIS — E782 Mixed hyperlipidemia: Secondary | ICD-10-CM | POA: Diagnosis not present

## 2017-03-11 DIAGNOSIS — J410 Simple chronic bronchitis: Secondary | ICD-10-CM | POA: Diagnosis not present

## 2017-03-11 DIAGNOSIS — R7301 Impaired fasting glucose: Secondary | ICD-10-CM | POA: Diagnosis not present

## 2017-06-04 DIAGNOSIS — J441 Chronic obstructive pulmonary disease with (acute) exacerbation: Secondary | ICD-10-CM | POA: Diagnosis not present

## 2017-06-05 DIAGNOSIS — J441 Chronic obstructive pulmonary disease with (acute) exacerbation: Secondary | ICD-10-CM | POA: Diagnosis not present

## 2017-09-10 ENCOUNTER — Encounter: Payer: Self-pay | Admitting: Pulmonary Disease

## 2017-09-10 ENCOUNTER — Ambulatory Visit (INDEPENDENT_AMBULATORY_CARE_PROVIDER_SITE_OTHER): Payer: Medicare Other | Admitting: Pulmonary Disease

## 2017-09-10 VITALS — BP 132/72 | HR 65 | Ht 66.0 in | Wt 138.5 lb

## 2017-09-10 DIAGNOSIS — R918 Other nonspecific abnormal finding of lung field: Secondary | ICD-10-CM | POA: Diagnosis not present

## 2017-09-10 DIAGNOSIS — J9611 Chronic respiratory failure with hypoxia: Secondary | ICD-10-CM

## 2017-09-10 DIAGNOSIS — J449 Chronic obstructive pulmonary disease, unspecified: Secondary | ICD-10-CM | POA: Diagnosis not present

## 2017-09-10 MED ORDER — TIOTROPIUM BROMIDE MONOHYDRATE 2.5 MCG/ACT IN AERS: 1.0000 | INHALATION_SPRAY | Freq: Every day | RESPIRATORY_TRACT | 0 refills | Status: DC

## 2017-09-10 NOTE — Patient Instructions (Signed)
   Continue using your inhalers as prescribed.  We will see you back in December after your CT scan.

## 2017-09-10 NOTE — Progress Notes (Signed)
Subjective:    Patient ID: Paul [name], male    DOB: 1946/07/20, 71 y.o.   MRN: [id number]  C.C.:  Follow-up for Very, Severe COPD, Chronic Nocturnal Hypoxic Respiratory Failure, & Multiple Lung Nodules.  HPI Very severe COPD: Prescribed Symbicort and Spiriva. He reports his breathing is doing "ok". He reports his coughing has improved since he moved out of his prior home. He denies any wheezing. No exacerbations since last appointment. He is using hiss rescue inhaler more in the summer and when playing golf. He has more dyspnea with exposure to heat & pollen.   Chronic nocturnal hypoxic respiratory failure: Prescribed oxygen at 2 L/m while sleeping. Patient is adherent.  Multiple lung nodules: First noted on CT imaging in December 2016. Patient had 2 new nodules in his left lung December 2017 on repeat imaging. Previous imaging somewhat confounded by influenza A infection. Scheduled for continued lung cancer screening with low dose chest CT in December 2018.  Review of Systems No chest pain or pressure. No fever or chills. No abdominal pain or nausea.   No Known Allergies  Current Outpatient Prescriptions on File Prior to Visit  Medication Sig Dispense Refill  . albuterol (PROVENTIL HFA;VENTOLIN HFA) 108 (90 BASE) MCG/ACT inhaler Inhale 2 puffs into the lungs every 4 (four) hours as needed for wheezing or shortness of breath.    . budesonide-formoterol (SYMBICORT) 160-4.5 MCG/ACT inhaler Inhale 2 puffs into the lungs 2 (two) times daily. 2 Inhaler 0  . fish oil-omega-3 fatty acids 1000 MG capsule Take 2 g by mouth daily.    Marland Kitchen loratadine (CLARITIN) 10 MG tablet Take 10 mg by mouth daily.    . Multiple Vitamin (MULTIVITAMIN WITH MINERALS) TABS tablet Take 1 tablet by mouth daily.    Marland Kitchen Respiratory Therapy Supplies (FLUTTER) DEVI Use 2-3 times daily 1 each 0  . simvastatin (ZOCOR) 40 MG tablet Take 20 mg by mouth every evening.    . Tiotropium Bromide Monohydrate (SPIRIVA RESPIMAT) 2.5  MCG/ACT AERS Inhale 2 puffs into the lungs daily. 1 Inhaler 0   No current facility-administered medications on file prior to visit.     Past Medical History:  Diagnosis Date  . COPD (chronic obstructive pulmonary disease) ([place])   . High cholesterol     Past Surgical History:  Procedure Laterality Date  . CATARACT EXTRACTION Right   . SKIN SURGERY     shoulders and chest    Family History  Problem Relation Age of Onset  . Heart disease Brother   . Heart disease Mother   . Heart disease Father   . Cancer Paternal Uncle     Social History   Social History  . Marital status: Married    Spouse name: N/A  . Number of children: 3  . Years of education: N/A   Occupational History  . retired     Administrator   Social History Main Topics  . Smoking status: Former Smoker    Packs/day: 2.00    Years: 52.00    Types: Cigarettes    Quit date: 02/03/2009  . Smokeless tobacco: Never Used     Comment: Counseled to remain smoke free  . Alcohol use 0.0 oz/week     Comment: Beer & Wine occasionally.  . Drug use: No  . Sexual activity: Not Asked   Other Topics Concern  . None   Social History Narrative   Originally from [place]. Previously worked driving a Restaurant manager, fast food. No pets currently. No  mold exposure. 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       Objective:   Physical Exam BP 132/72 (BP Location: Left Arm, Cuff Size: Normal)   Pulse 65   Ht 5\' 6"  (1.676 m)   Wt 138 lb 8 oz (62.8 kg)   SpO2 92%   BMI 22.35 kg/m   General: Thin, Caucasian male. No distress. Accompanied by wife today. Integument:  Warm & dry. No rash on exposed skin. No bruising on exposed skin. Extremities:  No cyanosis or clubbing.  HEENT:  Moist mucus membranes. No nasal turbinate swelling. No oral ulcers. Cardiovascular:  Regular rate. No edema. Unable to appreciate JVD.  Pulmonary:  Normal work of breathing on room air. Clear to  auscultation. Abdomen: Soft. Normal bowel sounds. Nondistended.  Musculoskeletal:  Normal bulk and tone. No joint deformity or effusion appreciated.  PFT 05/29/16: FVC 2.08 L (86%) FEV1 0.83 L (31%) FEV1/FVC 0.40 FEF 25-75 0.30 L (14%) no bronchodilator response TLC 6.56 L (107%) RV 167% ERV 160% DLCO corrected 60% (hemoglobin 10.9) 07/15/13: FVC 2.68 L (60%) FEV1 1.63 L (53%) FEV1/FVC 0.61 FEF 25-75 0.76 L (26%) 02/04/12: FVC 2.18 L (57%) FEV1 1.09 L (36%) FEV1/FVC 0.50 FEF 25-75 0.33 L (11%)  6MWT 05/29/16:  Walked 516 meters / Baseline Sat 95% on RA / Nadir Sat 89% on RA @ end of test  IMAGING LD CHEST CT W/O 02/20/17 (previously reviewed by me): Multiple bilateral nodules and particularly a nodular infiltrate within the left lower lobe. No pleural effusion or thickening. No pathologic mediastinal adenopathy. Largest lymph node precarinal measures under 1 cm and has fatty hilum. No pericardial effusion. Apical predominant emphysematous changes noted.  CT CHEST W/O 11/27/16 (previously reviewed by me with the patient):  New 4 mm left upper lobe nodule & 7 mm right upper lobe nodule. Calcified nodule in right upper lobe as well. Nodule within right lower lobe with some calcification and unchanged. Moderate centrilobular emphysema upper lobe predominant unchanged. No new developing mass. No pathologic mediastinal adenopathy. No pericardial effusion. No pleural effusion or thickening.  LD CT CHEST W/O 03/04/16 (previously reviewed by me): Moderate centrilobular emphysema. Scattered pulmonary nodules including a dominant irregular nodule right lower lobe 1.1 cm in maximal dimension. No pathologic mediastinal adenopathy. No pleural effusion. No pleural thickening. No pericardial effusion.  CXR PA/LAT 02/04/12 (per radiologist): Hyperinflation with opacities left greater than right. Pleural thickening versus small pleural effusion bilaterally left greater than right. Density in the major fissure on lateral  likely reflecting fluid.  LABS 10/01/13 Alpha-1 antitrypsin: MM     Assessment & Plan:  71 y.o. male with very severe COPD and chronic hypoxic respiratory failure using oxygen at night all sleeping. Patient COPD is well controlled on his current inhaler regimen. He does have some difficulty affording his inhaler medications and will be reapplying for prescription drug assistance in the new year. He also notably has multiple lung nodules and is continuing to be monitored with chest CT imaging. His next scan will be in December. I instructed the patient to contact our office if he had any new breathing problems or questions before his next appointment.  1. Very severe COPD:  Continuing Symbicort and Spiriva. Given samples of Spiriva today. Patient to obtain paperwork for drug assistance for the new year at next appointment. 2. Chronic hypoxic respiratory failure: Continuing on oxygen at 2 L/m while sleeping. 3. Multiple lung nodules: Continue  monitoring with low dose chest CT imaging in December 2018. 4. Health maintenance: Status post Prevnar January 2015 & Pneumovax September 2011. Administering high-dose influenza vaccine today. 5. Follow-up: Return to clinic in 3 months or sooner if needed.  Sonia Baller Ashok Cordia, M.D. [facility] Pulmonary & Critical Care Pager:  [phone] After 3pm or if no response, call [phone] 10:00 AM 09/10/17

## 2017-09-10 NOTE — Addendum Note (Signed)
Addended by: Della Goo C on: 09/10/2017 10:21 AM   Modules accepted: Orders

## 2017-09-15 DIAGNOSIS — R7301 Impaired fasting glucose: Secondary | ICD-10-CM | POA: Diagnosis not present

## 2017-09-15 DIAGNOSIS — E782 Mixed hyperlipidemia: Secondary | ICD-10-CM | POA: Diagnosis not present

## 2017-09-19 DIAGNOSIS — J410 Simple chronic bronchitis: Secondary | ICD-10-CM | POA: Diagnosis not present

## 2017-09-19 DIAGNOSIS — E782 Mixed hyperlipidemia: Secondary | ICD-10-CM | POA: Diagnosis not present

## 2017-09-19 DIAGNOSIS — R7301 Impaired fasting glucose: Secondary | ICD-10-CM | POA: Diagnosis not present

## 2017-11-05 DIAGNOSIS — J440 Chronic obstructive pulmonary disease with acute lower respiratory infection: Secondary | ICD-10-CM | POA: Diagnosis not present

## 2017-12-02 ENCOUNTER — Inpatient Hospital Stay: Admission: RE | Admit: 2017-12-02 | Payer: Medicare Other | Source: Ambulatory Visit

## 2017-12-04 ENCOUNTER — Telehealth: Payer: Self-pay | Admitting: Adult Health

## 2017-12-04 DIAGNOSIS — L570 Actinic keratosis: Secondary | ICD-10-CM | POA: Diagnosis not present

## 2017-12-04 DIAGNOSIS — Z87891 Personal history of nicotine dependence: Secondary | ICD-10-CM

## 2017-12-04 DIAGNOSIS — R918 Other nonspecific abnormal finding of lung field: Secondary | ICD-10-CM

## 2017-12-04 NOTE — Telephone Encounter (Signed)
Pt is a former Dr Ashok Cordia patient. A CT was ordered for a lung nodule follow up per Dr Ashok Cordia and is scheduled for 12/05/2017. However, Dr. Ashok Cordia is no longer a provider with our practice, per office protocol the CT chest was reordered under Tammy Parretts, NP. Tammy Parrett is already aware. Nothing further needed at this time.

## 2017-12-05 ENCOUNTER — Inpatient Hospital Stay: Admission: RE | Admit: 2017-12-05 | Payer: Medicare Other | Source: Ambulatory Visit

## 2017-12-05 ENCOUNTER — Ambulatory Visit (INDEPENDENT_AMBULATORY_CARE_PROVIDER_SITE_OTHER)
Admission: RE | Admit: 2017-12-05 | Discharge: 2017-12-05 | Disposition: A | Discharge status: Home / Self Care | Payer: Medicare Other | Source: Ambulatory Visit | Attending: Adult Health | Admitting: Adult Health

## 2017-12-05 DIAGNOSIS — R918 Other nonspecific abnormal finding of lung field: Secondary | ICD-10-CM | POA: Diagnosis not present

## 2017-12-05 DIAGNOSIS — Z87891 Personal history of nicotine dependence: Secondary | ICD-10-CM

## 2017-12-05 DIAGNOSIS — J439 Emphysema, unspecified: Secondary | ICD-10-CM | POA: Diagnosis not present

## 2017-12-08 DIAGNOSIS — Z961 Presence of intraocular lens: Secondary | ICD-10-CM | POA: Diagnosis not present

## 2017-12-10 ENCOUNTER — Telehealth: Payer: Self-pay | Admitting: Adult Health

## 2017-12-10 ENCOUNTER — Encounter: Payer: Self-pay | Admitting: Adult Health

## 2017-12-10 ENCOUNTER — Ambulatory Visit (INDEPENDENT_AMBULATORY_CARE_PROVIDER_SITE_OTHER): Payer: Medicare Other | Admitting: Adult Health

## 2017-12-10 DIAGNOSIS — IMO0001 Reserved for inherently not codable concepts without codable children: Secondary | ICD-10-CM

## 2017-12-10 DIAGNOSIS — R911 Solitary pulmonary nodule: Secondary | ICD-10-CM

## 2017-12-10 DIAGNOSIS — J9611 Chronic respiratory failure with hypoxia: Secondary | ICD-10-CM

## 2017-12-10 MED ORDER — TIOTROPIUM BROMIDE MONOHYDRATE 2.5 MCG/ACT IN AERS: 2.0000 | INHALATION_SPRAY | Freq: Every day | RESPIRATORY_TRACT | 0 refills | Status: DC

## 2017-12-10 MED ORDER — BUDESONIDE-FORMOTEROL FUMARATE 160-4.5 MCG/ACT IN AERO: 2.0000 | INHALATION_SPRAY | Freq: Two times a day (BID) | RESPIRATORY_TRACT | 0 refills | Status: DC

## 2017-12-10 NOTE — Assessment & Plan Note (Signed)
Doing very well   Plan  Patient Instructions  Continue on Symbicort and Spiriva Continue on oxygen at bedtime.  Order to see if can change to simply concentrator.  Plan for CT chest December 2019 to follow lung nodules. Follow-up with Dr. Vaughan Browner in 6 months and As needed   Follow up with PCP regarding plaque buildup noted on CT chest .

## 2017-12-10 NOTE — Telephone Encounter (Signed)
Patient saw TP this morning >> SimplyGo was ordered because patient wears 2lpm nocturnal O2 and travels a lot  Got a call from [place] w/ [facility] earlier this afternoon Insurance is denying this because a POC is for patients with daytime/exertional O2, not just nocturnal oxygen  Corene Cornea recommends patient to call for "travel O2" 2 weeks prior to trip and they can arrange for smaller equipment  Called spoke with patient's spouse who accompanied him to the visit today and discussed the above Spouse voiced her understanding but stated insurance had denied a rental unit in the past, stating that if patient wasn't on continuous O2 then a travel concentrator couldn't be rented  Advised spouse to call [facility] for travel O2 3-4 weeks in advance of the next trip (Feb 2019) and if they are told that same thing again, to call me for assistance.  She voiced her understanding  Will sign and forward to TP as FYI

## 2017-12-10 NOTE — Assessment & Plan Note (Signed)
Cont on oxygen 2l/m At bedtime  .  Try to change home concentrator to simply go concentrator .

## 2017-12-10 NOTE — Patient Instructions (Addendum)
Continue on Symbicort and Spiriva Continue on oxygen at bedtime.  Order to see if can change to simply concentrator.  Plan for CT chest December 2019 to follow lung nodules. Follow-up with Dr. Vaughan Browner in 6 months and As needed   Follow up with PCP regarding plaque buildup noted on CT chest .

## 2017-12-10 NOTE — Addendum Note (Signed)
Addended by: Parke Poisson E on: 12/10/2017 12:43 PM   Modules accepted: Orders

## 2017-12-10 NOTE — Assessment & Plan Note (Signed)
Stable lung nodule on serial CT chest  Check LDCT in 1 year.

## 2017-12-10 NOTE — Progress Notes (Signed)
@Patient  ID: Paul [name], male    DOB: 13-Sep-1946, 71 y.o.   MRN: [id number]  Chief Complaint  Patient presents with  . 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 and multiple lung nodules  TEST  PFT 05/29/16: FVC 2.08 L (86%) FEV1 0.83 L (31%) FEV1/FVC 0.40 FEF 25-75 0.30 L (14%) no bronchodilator response TLC 6.56 L (107%) RV 167% ERV 160% DLCO corrected 60% (hemoglobin 10.9) 07/15/13: FVC 2.68 L (60%) FEV1 1.63 L (53%) FEV1/FVC 0.61 FEF 25-75 0.76 L (26%) 02/04/12: FVC 2.18 L (57%) FEV1 1.09 L (36%) FEV1/FVC 0.50 FEF 25-75 0.33 L (11%)  6MWT 05/29/16:  Walked 516 meters / Baseline Sat 95% on RA / Nadir Sat 89% on RA @ end of test  IMAGING LD CHEST CT W/O 02/20/17  Multiple bilateral nodules and particularly a nodular infiltrate within the left lower lobe. No pleural effusion or thickening. No pathologic mediastinal adenopathy. Largest lymph node precarinal measures under 1 cm and has fatty hilum. No pericardial effusion. Apical predominant emphysematous changes noted.  CT CHEST W/O 11/27/16 (:  New 4 mm left upper lobe nodule & 7 mm right upper lobe nodule. Calcified nodule in right upper lobe as well. Nodule within right lower lobe with some calcification and unchanged. Moderate centrilobular emphysema upper lobe predominant unchanged. No new developing mass. No pathologic mediastinal adenopathy. No pericardial effusion. No pleural effusion or thickening.  LD CT CHEST W/O 3/13/1): Moderate centrilobular emphysema. Scattered pulmonary nodules including a dominant irregular nodule right lower lobe 1.1 cm in maximal dimension. No pathologic mediastinal adenopathy. No pleural effusion. No pleural thickening. No pericardial effusion.  CXR PA/LAT 02/04/12 (per radiologist): Hyperinflation with opacities left greater than right. Pleural thickening  versus small pleural effusion bilaterally left greater than right. Density in the major fissure on lateral likely reflecting fluid.  LABS 10/01/13 Alpha-1 antitrypsin: MM    12/10/2017 Follow up: COPD, O2 RF, Lung nodules  Patient returns for a 52-month follow-up.  Patient has known very severe COPD. He remains on Symbicort and Spiriva.  Denies a flare of cough or shortness of breath.  He denies any hemoptysis chest pain orthopnea PND. Says he remains active, goes to gym and plays golf. Married and goes on cruises.  Flu , PVX and Prevnar utd.   Patient has nocturnal hypoxemia and is on  oxygen at bedtime 2 L. Needs a more portable system for travel.   Patient has known pulmonary nodules that are being followed serially on CT chest. CT chest done on December 05, 2017 showed moderate emphysema, multiple small pulmonary nodules without change.  Recommendations for low-dose CT chest in 12 months.   No Known Allergies  Immunization History  Administered Date(s) Administered  . Influenza Split 09/22/2013, 09/01/2015  . Influenza Whole 09/04/2011, 09/21/2012  . Influenza, High Dose Seasonal PF 09/02/2016, 09/10/2017  . Influenza-Unspecified 08/23/2014  . Pneumococcal Conjugate-13 01/11/2014  . Pneumococcal Polysaccharide-23 08/23/2010    Past Medical History:  Diagnosis Date  . COPD (chronic obstructive pulmonary disease) ([place])   . High cholesterol     Tobacco History: Social History   Tobacco Use  Smoking Status Former Smoker  . Packs/day: 2.00  . Years: 52.00  . Pack years: 104.00  . Types: Cigarettes  . Last attempt to quit: 02/03/2009  . Years since quitting: 8.8  Smokeless Tobacco Never Used  Tobacco  Comment   Counseled to remain smoke free   Counseling given: Not Answered Comment: Counseled to remain smoke free   Outpatient Encounter Medications as of 12/10/2017  Medication Sig  . Tiotropium Bromide Monohydrate (SPIRIVA RESPIMAT) 2.5 MCG/ACT AERS Inhale 2 puffs  into the lungs daily.  Marland Kitchen albuterol (PROVENTIL HFA;VENTOLIN HFA) 108 (90 BASE) MCG/ACT inhaler Inhale 2 puffs into the lungs every 4 (four) hours as needed for wheezing or shortness of breath.  . budesonide-formoterol (SYMBICORT) 160-4.5 MCG/ACT inhaler Inhale 2 puffs into the lungs 2 (two) times daily.  . budesonide-formoterol (SYMBICORT) 160-4.5 MCG/ACT inhaler Inhale 2 puffs into the lungs 2 (two) times daily.  . fish oil-omega-3 fatty acids 1000 MG capsule Take 2 g by mouth daily.  Marland Kitchen loratadine (CLARITIN) 10 MG tablet Take 10 mg by mouth daily.  . Multiple Vitamin (MULTIVITAMIN WITH MINERALS) TABS tablet Take 1 tablet by mouth daily.  Marland Kitchen Respiratory Therapy Supplies (FLUTTER) DEVI Use 2-3 times daily  . simvastatin (ZOCOR) 40 MG tablet Take 20 mg by mouth every evening.  . Tiotropium Bromide Monohydrate (SPIRIVA RESPIMAT) 2.5 MCG/ACT AERS Inhale 2 puffs into the lungs daily.  . [DISCONTINUED] Tiotropium Bromide Monohydrate (SPIRIVA RESPIMAT) 2.5 MCG/ACT AERS Inhale 2 puffs into the lungs daily.  . [DISCONTINUED] Tiotropium Bromide Monohydrate (SPIRIVA RESPIMAT) 2.5 MCG/ACT AERS Inhale 1 puff into the lungs daily.   No facility-administered encounter medications on file as of 12/10/2017.      Review of Systems  Constitutional:   No  weight loss, night sweats,  Fevers, chills,  +fatigue, or  lassitude.  HEENT:   No headaches,  Difficulty swallowing,  Tooth/dental problems, or  Sore throat,                No sneezing, itching, ear ache, + nasal congestion, post nasal drip,   CV:  No chest pain,  Orthopnea, PND, swelling in lower extremities, anasarca, dizziness, palpitations, syncope.   GI  No heartburn, indigestion, abdominal pain, nausea, vomiting, diarrhea, change in bowel habits, loss of appetite, bloody stools.   Resp:  .  No chest wall deformity  Skin: no rash or lesions.  GU: no dysuria, change in color of urine, no urgency or frequency.  No flank pain, no hematuria   MS:   No joint pain or swelling.  No decreased range of motion.  No back pain.    Physical Exam  BP 128/66 (BP Location: Left Arm, Cuff Size: Normal)   Pulse 74   Ht 5\' 6"  (1.676 m)   Wt 150 lb (68 kg)   SpO2 98%   BMI 24.21 kg/m   GEN: A/Ox3; pleasant , NAD, elderly    HEENT:  [place]/AT,  EACs-clear, TMs-wnl, NOSE-clear, THROAT-clear, no lesions, no postnasal drip or exudate noted.   NECK:  Supple w/ fair ROM; no JVD; normal carotid impulses w/o bruits; no thyromegaly or nodules palpated; no lymphadenopathy.    RESP  Clear  P & A; w/o, wheezes/ rales/ or rhonchi. no accessory muscle use, no dullness to percussion  CARD:  RRR, no m/r/g, no peripheral edema, pulses intact, no cyanosis or clubbing.  GI:   Soft & nt; nml bowel sounds; no organomegaly or masses detected.   Musco: Warm bil, no deformities or joint swelling noted.   Neuro: alert, no focal deficits noted.    Skin: Warm, no lesions or rashes    Lab Results:   BMET   BNP No results found for: BNP  ProBNP No results found for:  PROBNP  Imaging: Ct Chest Lcs Nodule F/u W/o Contrast  Result Date: 12/05/2017 CLINICAL DATA:  Lung cancer screening. Former asymptomatic smoker. One hundred pack-year history. EXAM: CT CHEST WITHOUT CONTRAST FOR LUNG CANCER SCREENING NODULE FOLLOW-UP TECHNIQUE: Multidetector CT imaging of the chest was performed following the standard protocol without IV contrast. COMPARISON:  02/20/2017 FINDINGS: Cardiovascular: Normal heart size. Aortic atherosclerosis. Calcifications in the RCA and LAD coronary arteries noted. No pericardial effusion. Mediastinum/Nodes: No enlarged mediastinal, hilar, or axillary lymph nodes. Thyroid gland, trachea, and esophagus demonstrate no significant findings. Lungs/Pleura: Moderate changes of centrilobular emphysema. No pleural effusion. Multiple small pulmonary nodules are again noted in both lungs. The largest noncalcified nodule is in the right base with an  equivalent diameter of 9.3 mm. Unchanged from previous exam. Upper Abdomen: No acute abnormality. Musculoskeletal: No chest wall mass or suspicious bone lesions identified. IMPRESSION: 1. Lung-RADS 2, benign appearance or behavior. Continue annual screening with low-dose chest CT without contrast in 12 months. 2. Aortic Atherosclerosis (ICD10-I70.0) and Emphysema (ICD10-J43.9). 3. RCA and LAD coronary artery calcification. Electronically Signed   By: Kerby Moors M.D.   On: 12/05/2017 14:46     Assessment & Plan:   COPD (chronic obstructive pulmonary disease), gold stage C. Doing very well   Plan  Patient Instructions  Continue on Symbicort and Spiriva Continue on oxygen at bedtime.  Order to see if can change to simply concentrator.  Plan for CT chest December 2019 to follow lung nodules. Follow-up with Dr. Vaughan Browner in 6 months and As needed   Follow up with PCP regarding plaque buildup noted on CT chest .      Chronic respiratory failure with hypoxia Cont on oxygen 2l/m At bedtime  .  Try to change home concentrator to simply go concentrator .     Lung nodule < 6cm on CT Stable lung nodule on serial CT chest  Check LDCT in 1 year.      Rexene Edison, NP 12/10/2017

## 2018-01-14 DIAGNOSIS — Z0000 Encounter for general adult medical examination without abnormal findings: Secondary | ICD-10-CM | POA: Diagnosis not present

## 2018-01-14 DIAGNOSIS — Z125 Encounter for screening for malignant neoplasm of prostate: Secondary | ICD-10-CM | POA: Diagnosis not present

## 2018-01-14 DIAGNOSIS — Z6824 Body mass index (BMI) 24.0-24.9, adult: Secondary | ICD-10-CM | POA: Diagnosis not present

## 2018-03-09 DIAGNOSIS — E782 Mixed hyperlipidemia: Secondary | ICD-10-CM | POA: Diagnosis not present

## 2018-03-09 DIAGNOSIS — Z125 Encounter for screening for malignant neoplasm of prostate: Secondary | ICD-10-CM | POA: Diagnosis not present

## 2018-03-09 DIAGNOSIS — D6489 Other specified anemias: Secondary | ICD-10-CM | POA: Diagnosis not present

## 2018-03-09 DIAGNOSIS — D649 Anemia, unspecified: Secondary | ICD-10-CM | POA: Diagnosis not present

## 2018-03-09 DIAGNOSIS — R7301 Impaired fasting glucose: Secondary | ICD-10-CM | POA: Diagnosis not present

## 2018-03-11 DIAGNOSIS — D6489 Other specified anemias: Secondary | ICD-10-CM | POA: Diagnosis not present

## 2018-03-11 DIAGNOSIS — R7301 Impaired fasting glucose: Secondary | ICD-10-CM | POA: Diagnosis not present

## 2018-03-11 DIAGNOSIS — Z6824 Body mass index (BMI) 24.0-24.9, adult: Secondary | ICD-10-CM | POA: Diagnosis not present

## 2018-03-11 DIAGNOSIS — E782 Mixed hyperlipidemia: Secondary | ICD-10-CM | POA: Diagnosis not present

## 2018-03-11 DIAGNOSIS — J410 Simple chronic bronchitis: Secondary | ICD-10-CM | POA: Diagnosis not present

## 2018-03-28 DIAGNOSIS — J449 Chronic obstructive pulmonary disease, unspecified: Secondary | ICD-10-CM | POA: Diagnosis not present

## 2018-03-30 DIAGNOSIS — J441 Chronic obstructive pulmonary disease with (acute) exacerbation: Secondary | ICD-10-CM | POA: Diagnosis not present

## 2018-03-30 LAB — FECAL OCCULT BLOOD, GUAIAC: Fecal Occult Blood: NEGATIVE

## 2018-06-17 ENCOUNTER — Encounter: Payer: Self-pay | Admitting: Pulmonary Disease

## 2018-06-17 ENCOUNTER — Other Ambulatory Visit: Payer: Self-pay | Admitting: Acute Care

## 2018-06-17 ENCOUNTER — Ambulatory Visit (INDEPENDENT_AMBULATORY_CARE_PROVIDER_SITE_OTHER): Payer: Medicare Other | Admitting: Pulmonary Disease

## 2018-06-17 VITALS — BP 112/62 | HR 76 | Ht 66.0 in | Wt 149.2 lb

## 2018-06-17 DIAGNOSIS — G4734 Idiopathic sleep related nonobstructive alveolar hypoventilation: Secondary | ICD-10-CM | POA: Diagnosis not present

## 2018-06-17 DIAGNOSIS — Z87891 Personal history of nicotine dependence: Secondary | ICD-10-CM

## 2018-06-17 DIAGNOSIS — R918 Other nonspecific abnormal finding of lung field: Secondary | ICD-10-CM

## 2018-06-17 DIAGNOSIS — J449 Chronic obstructive pulmonary disease, unspecified: Secondary | ICD-10-CM | POA: Diagnosis not present

## 2018-06-17 DIAGNOSIS — Z122 Encounter for screening for malignant neoplasm of respiratory organs: Secondary | ICD-10-CM

## 2018-06-17 MED ORDER — TIOTROPIUM BROMIDE MONOHYDRATE 2.5 MCG/ACT IN AERS: 1.0000 | INHALATION_SPRAY | Freq: Every day | RESPIRATORY_TRACT | 0 refills | Status: DC

## 2018-06-17 MED ORDER — BUDESONIDE-FORMOTEROL FUMARATE 160-4.5 MCG/ACT IN AERO: 2.0000 | INHALATION_SPRAY | Freq: Two times a day (BID) | RESPIRATORY_TRACT | 0 refills | Status: DC

## 2018-06-17 NOTE — Progress Notes (Signed)
Paul [name]    [id number]    [birth year]  Primary Care Physician:Cox, Elnita Maxwell, MD  Referring Physician: [name], MD [address] [place], [place] [address]  Chief complaint: Follow-up for severe COPD, chronic respiratory failure, lung nodules  HPI: History of very severe COPD.  Previously followed by Dr. Tyrone Schimke on Symbicort and Spiriva, nocturnal oxygen He is doing well with his inhaler regimen.  Recently moved from his old house to a new [place], [place].  States that his breathing has improved remarkably with this change in residence.  He does not cough as much with improving dyspnea on exertion.  No sputum production, wheezing, fevers, chills  He continues to have an active lifestyle with golf several times a week.  He exercises on the treadmill in the gym 3 times a week. Likes to cruise and was recently in the [place]   Outpatient Encounter Medications as of 06/17/2018  Medication Sig  . albuterol (PROVENTIL HFA;VENTOLIN HFA) 108 (90 BASE) MCG/ACT inhaler Inhale 2 puffs into the lungs every 4 (four) hours as needed for wheezing or shortness of breath.  . budesonide-formoterol (SYMBICORT) 160-4.5 MCG/ACT inhaler Inhale 2 puffs into the lungs 2 (two) times daily.  . fish oil-omega-3 fatty acids 1000 MG capsule Take 2 g by mouth daily.  Marland Kitchen loratadine (CLARITIN) 10 MG tablet Take 10 mg by mouth daily.  . Multiple Vitamin (MULTIVITAMIN WITH MINERALS) TABS tablet Take 1 tablet by mouth daily.  Marland Kitchen Respiratory Therapy Supplies (FLUTTER) DEVI Use 2-3 times daily  . simvastatin (ZOCOR) 40 MG tablet Take 20 mg by mouth every evening.  . Tiotropium Bromide Monohydrate (SPIRIVA RESPIMAT) 2.5 MCG/ACT AERS Inhale 2 puffs into the lungs daily.  . [DISCONTINUED] budesonide-formoterol (SYMBICORT) 160-4.5 MCG/ACT inhaler Inhale 2 puffs into the lungs 2 (two) times daily.  . [DISCONTINUED] Tiotropium Bromide Monohydrate (SPIRIVA RESPIMAT) 2.5 MCG/ACT  AERS Inhale 2 puffs into the lungs daily.   No facility-administered encounter medications on file as of 06/17/2018.     Allergies as of 06/17/2018  . (No Known Allergies)    Past Medical History:  Diagnosis Date  . COPD (chronic obstructive pulmonary disease) ([place])   . High cholesterol     Past Surgical History:  Procedure Laterality Date  . CATARACT EXTRACTION Right   . SKIN SURGERY     shoulders and chest    Family History  Problem Relation Age of Onset  . Heart disease Brother   . Heart disease Mother   . Heart disease Father   . Cancer Paternal Uncle     Social History   Socioeconomic History  . Marital status: Married    Spouse name: Not on file  . Number of children: 3  . Years of education: Not on file  . Highest education level: Not on file  Occupational History  . Occupation: retired    Comment: truck Diplomatic Services operational officer  . Financial resource strain: Not on file  . Food insecurity:    Worry: Not on file    Inability: Not on file  . Transportation needs:    Medical: Not on file    Non-medical: Not on file  Tobacco Use  . Smoking status: Former Smoker    Packs/day: 2.00    Years: 52.00    Pack years: 104.00    Types: Cigarettes    Last attempt to quit: 02/03/2009    Years since quitting: 9.3  . Smokeless tobacco:  Never Used  . Tobacco comment: Counseled to remain smoke free  Substance and Sexual Activity  . Alcohol use: Yes    Alcohol/week: 0.0 oz    Comment: Beer & Wine occasionally.  . Drug use: No  . Sexual activity: Not on file  Lifestyle  . Physical activity:    Days per week: Not on file    Minutes per session: Not on file  . Stress: Not on file  Relationships  . Social connections:    Talks on phone: Not on file    Gets together: Not on file    Attends religious service: Not on file    Active member of club or organization: Not on file    Attends meetings of clubs or organizations: Not on file    Relationship status: Not on  file  . Intimate partner violence:    Fear of current or ex partner: Not on file    Emotionally abused: Not on file    Physically abused: Not on file    Forced sexual activity: Not on file  Other Topics Concern  . Not on file  Social History Narrative   Originally from [place]. Previously worked driving a Restaurant manager, fast food. No pets currently. No mold exposure. 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     Review of systems: Review of Systems  Constitutional: Negative for fever and chills.  HENT: Negative.   Eyes: Negative for blurred vision.  Respiratory: as per HPI  Cardiovascular: Negative for chest pain and palpitations.  Gastrointestinal: Negative for vomiting, diarrhea, blood per rectum. Genitourinary: Negative for dysuria, urgency, frequency and hematuria.  Musculoskeletal: Negative for myalgias, back pain and joint pain.  Skin: Negative for itching and rash.  Neurological: Negative for dizziness, tremors, focal weakness, seizures and loss of consciousness.  Endo/Heme/Allergies: Negative for environmental allergies.  Psychiatric/Behavioral: Negative for depression, suicidal ideas and hallucinations.  All other systems reviewed and are negative.  Physical Exam: Blood pressure 112/62, pulse 76, height 5\' 6"  (1.676 m), weight 149 lb 3.2 oz (67.7 kg), SpO2 99 %. Gen:      No acute distress HEENT:  EOMI, sclera anicteric Neck:     No masses; no thyromegaly Lungs:    Clear to auscultation bilaterally; normal respiratory effort CV:         Regular rate and rhythm; no murmurs Abd:      + bowel sounds; soft, non-tender; no palpable masses, no distension Ext:    No edema; adequate peripheral perfusion Skin:      Warm and dry; no rash Neuro: alert and oriented x 3 Psych: normal mood and affect  Data Reviewed: PFT 05/29/16: FVC 2.08 L (86%) FEV1 0.83 L (31%) FEV1/FVC 0.40 FEF 25-75 0.30 L (14%) no bronchodilator response TLC  6.56 L (107%) RV 167% ERV 160% DLCO corrected 60% (hemoglobin 10.9) 07/15/13: FVC 2.68 L (60%) FEV1 1.63 L (53%) FEV1/FVC 0.61 FEF 25-75 0.76 L (26%) 02/04/12: FVC 2.18 L (57%) FEV1 1.09 L (36%) FEV1/FVC 0.50 FEF 25-75 0.33 L (11%)   6MWT 05/29/16:  Walked 516 meters / Baseline Sat 95% on RA / Nadir Sat 89% on RA @ end of test    IMAGING CT chest 11/27/2016- new 4 mm left upper lobe nodule, 7 mm right upper lobe nodule, calcified nodule in the right upper lobe.  Nodules in the right lower lobe with mild calcification.  Emphysematous changes. Low-dose screening CT 02/20/2017- bilateral  pulmonary nodules, emphysematous changes Low-dose screening CT 12/05/2017-emphysematous changes, bilateral pulmonary nodules are stable. I have reviewed the images personally.  LABS 10/01/13 Alpha-1 antitrypsin: MM   Assessment:  Severe COPD Continues on Symbicort, Spiriva with good control of symptoms. Supplemental oxygen at night  Lung nodules Latest CT scan from December 2018 reviewed with stable lung nodule.  Continue annual low-dose screening CT of the chest.  Health maintenance 09/10/2017-influenza 01/11/2014-Prevnar 08/23/2010-Pneumovax  Plan/Recommendations: - Continue Symbicort, Spiriva, nocturnal oxygen - Annual screening CT of chest.  [name] MD [facility] 06/17/2018, 9:23 AM  CC: [name], MD

## 2018-06-17 NOTE — Patient Instructions (Signed)
I am glad you are doing well with your breathing Continue Symbicort and Spiriva. You have a follow-up annual CT scan scheduled for December See back in clinic after the scan.  Please call us sooner if there is any change in his symptoms.

## 2018-09-11 DIAGNOSIS — R7301 Impaired fasting glucose: Secondary | ICD-10-CM | POA: Diagnosis not present

## 2018-09-11 DIAGNOSIS — E782 Mixed hyperlipidemia: Secondary | ICD-10-CM | POA: Diagnosis not present

## 2018-09-15 DIAGNOSIS — J410 Simple chronic bronchitis: Secondary | ICD-10-CM | POA: Diagnosis not present

## 2018-09-15 DIAGNOSIS — R7301 Impaired fasting glucose: Secondary | ICD-10-CM | POA: Diagnosis not present

## 2018-09-15 DIAGNOSIS — Z6823 Body mass index (BMI) 23.0-23.9, adult: Secondary | ICD-10-CM | POA: Diagnosis not present

## 2018-09-15 DIAGNOSIS — E782 Mixed hyperlipidemia: Secondary | ICD-10-CM | POA: Diagnosis not present

## 2018-09-24 DIAGNOSIS — Z23 Encounter for immunization: Secondary | ICD-10-CM | POA: Diagnosis not present

## 2018-10-15 ENCOUNTER — Telehealth: Payer: Self-pay | Admitting: Pulmonary Disease

## 2018-10-15 NOTE — Telephone Encounter (Signed)
Called and spoke with pt's wife [name] letting her know that the PCCs would call pt to get the CT scan scheduled. I checked and there is an order in place for the scan.  [name] expressed understanding. Nothing further needed.

## 2018-11-22 DIAGNOSIS — J449 Chronic obstructive pulmonary disease, unspecified: Secondary | ICD-10-CM | POA: Diagnosis not present

## 2018-11-22 DIAGNOSIS — J0100 Acute maxillary sinusitis, unspecified: Secondary | ICD-10-CM | POA: Diagnosis not present

## 2018-11-25 ENCOUNTER — Ambulatory Visit: Payer: Medicare Other | Admitting: Pulmonary Disease

## 2018-12-07 ENCOUNTER — Ambulatory Visit (INDEPENDENT_AMBULATORY_CARE_PROVIDER_SITE_OTHER)
Admission: RE | Admit: 2018-12-07 | Discharge: 2018-12-07 | Disposition: A | Discharge status: Home / Self Care | Payer: Medicare Other | Source: Ambulatory Visit | Attending: Acute Care | Admitting: Acute Care

## 2018-12-07 DIAGNOSIS — Z87891 Personal history of nicotine dependence: Secondary | ICD-10-CM

## 2018-12-07 DIAGNOSIS — Z122 Encounter for screening for malignant neoplasm of respiratory organs: Secondary | ICD-10-CM

## 2018-12-08 DIAGNOSIS — L821 Other seborrheic keratosis: Secondary | ICD-10-CM | POA: Diagnosis not present

## 2018-12-08 DIAGNOSIS — D485 Neoplasm of uncertain behavior of skin: Secondary | ICD-10-CM | POA: Diagnosis not present

## 2018-12-08 DIAGNOSIS — C44519 Basal cell carcinoma of skin of other part of trunk: Secondary | ICD-10-CM | POA: Diagnosis not present

## 2018-12-09 ENCOUNTER — Encounter: Payer: Self-pay | Admitting: Pulmonary Disease

## 2018-12-09 ENCOUNTER — Ambulatory Visit (INDEPENDENT_AMBULATORY_CARE_PROVIDER_SITE_OTHER): Payer: Medicare Other | Admitting: Pulmonary Disease

## 2018-12-09 VITALS — BP 120/72 | HR 73 | Ht 66.0 in | Wt 150.2 lb

## 2018-12-09 DIAGNOSIS — J449 Chronic obstructive pulmonary disease, unspecified: Secondary | ICD-10-CM | POA: Diagnosis not present

## 2018-12-09 DIAGNOSIS — R911 Solitary pulmonary nodule: Secondary | ICD-10-CM

## 2018-12-09 DIAGNOSIS — IMO0001 Reserved for inherently not codable concepts without codable children: Secondary | ICD-10-CM

## 2018-12-09 MED ORDER — BUDESONIDE-FORMOTEROL FUMARATE 160-4.5 MCG/ACT IN AERO: 2.0000 | INHALATION_SPRAY | Freq: Two times a day (BID) | RESPIRATORY_TRACT | 0 refills | Status: DC

## 2018-12-09 MED ORDER — TIOTROPIUM BROMIDE MONOHYDRATE 2.5 MCG/ACT IN AERS: 2.0000 | INHALATION_SPRAY | Freq: Every day | RESPIRATORY_TRACT | 0 refills | Status: DC

## 2018-12-09 NOTE — Addendum Note (Signed)
Addended by: Len Blalock on: 12/09/2018 09:36 AM   Modules accepted: Orders

## 2018-12-09 NOTE — Progress Notes (Signed)
Paul [name]    [id number]    [birth year]  Primary Care Physician:Cox, Elnita Maxwell, MD  Referring Physician: [name], MD [address] [place], [place] [address]  Chief complaint: Follow-up for severe COPD, chronic respiratory failure, lung nodules  HPI: History of very severe COPD.  Previously followed by Dr. Tyrone Schimke on Symbicort and Spiriva, nocturnal oxygen He is doing well with his inhaler regimen.  Recently moved from his old house to a new [place], [place].  States that his breathing has improved remarkably with this change in residence.  He does not cough as much with improving dyspnea on exertion.  No sputum production, wheezing, fevers, chills  He continues to have an active lifestyle with golf several times a week.  He exercises on the treadmill in the gym 3 times a week.  Interim history: Continues to do well with regard to breathing.  Playing golf several times a day Currently on Symbicort and Spiriva.  Outpatient Encounter Medications as of 12/09/2018  Medication Sig  . albuterol (PROVENTIL HFA;VENTOLIN HFA) 108 (90 BASE) MCG/ACT inhaler Inhale 2 puffs into the lungs every 4 (four) hours as needed for wheezing or shortness of breath.  . budesonide-formoterol (SYMBICORT) 160-4.5 MCG/ACT inhaler Inhale 2 puffs into the lungs 2 (two) times daily.  . fish oil-omega-3 fatty acids 1000 MG capsule Take 2 g by mouth daily.  Marland Kitchen loratadine (CLARITIN) 10 MG tablet Take 10 mg by mouth daily.  . Multiple Vitamin (MULTIVITAMIN WITH MINERALS) TABS tablet Take 1 tablet by mouth daily.  Marland Kitchen Respiratory Therapy Supplies (FLUTTER) DEVI Use 2-3 times daily  . simvastatin (ZOCOR) 40 MG tablet Take 20 mg by mouth every evening.  . Tiotropium Bromide Monohydrate (SPIRIVA RESPIMAT) 2.5 MCG/ACT AERS Inhale 2 puffs into the lungs daily.  . [DISCONTINUED] budesonide-formoterol (SYMBICORT) 160-4.5 MCG/ACT inhaler Inhale 2 puffs into the lungs 2 (two) times daily for  1 day.  . [DISCONTINUED] Tiotropium Bromide Monohydrate (SPIRIVA RESPIMAT) 2.5 MCG/ACT AERS Inhale 1 puff into the lungs daily for 1 day.   No facility-administered encounter medications on file as of 12/09/2018.    Physical Exam: Blood pressure 120/72, pulse 73, height 5\' 6"  (1.676 m), weight 150 lb 3.2 oz (68.1 kg), SpO2 96 %. Gen:      No acute distress HEENT:  EOMI, sclera anicteric Neck:     No masses; no thyromegaly Lungs:    Clear to auscultation bilaterally; normal respiratory effort CV:         Regular rate and rhythm; no murmurs Abd:      + bowel sounds; soft, non-tender; no palpable masses, no distension Ext:    No edema; adequate peripheral perfusion Skin:      Warm and dry; no rash Neuro: alert and oriented x 3 Psych: normal mood and affect  Data Reviewed: PFT 05/29/16: FVC 2.08 L (86%) FEV1 0.83 L (31%) FEV1/FVC 0.40 FEF 25-75 0.30 L (14%) no bronchodilator response TLC 6.56 L (107%) RV 167% ERV 160% DLCO corrected 60% (hemoglobin 10.9) 07/15/13: FVC 2.68 L (60%) FEV1 1.63 L (53%) FEV1/FVC 0.61 FEF 25-75 0.76 L (26%) 02/04/12: FVC 2.18 L (57%) FEV1 1.09 L (36%) FEV1/FVC 0.50 FEF 25-75 0.33 L (11%)   6MWT 05/29/16:  Walked 516 meters / Baseline Sat 95% on RA / Nadir Sat 89% on RA @ end of test    IMAGING CT chest 11/27/2016- new 4 mm left upper lobe nodule, 7 mm right upper lobe nodule,  calcified nodule in the right upper lobe.  Nodules in the right lower lobe with mild calcification.  Emphysematous changes. Low-dose screening CT 02/20/2017- bilateral pulmonary nodules, emphysematous changes Low-dose screening CT 12/05/2017-emphysematous changes, bilateral pulmonary nodules are stable. Low-dose screening CT 12/07/2018- emphysematous changes, lung nodules are stable. I have reviewed the images personally  LABS 10/01/13 Alpha-1 antitrypsin: MM   Assessment:  Severe COPD Continues on Symbicort, Spiriva with good control of symptoms. Supplemental oxygen at night  Lung  nodules Latest CT scan from December 2018 reviewed with stable lung nodule.  Continue annual low-dose screening CT of the chest.  Health maintenance 08/31/2018-influenza 01/11/2014-Prevnar 08/23/2010-Pneumovax  Plan/Recommendations: - Continue Symbicort, Spiriva, nocturnal oxygen - Annual screening CT of chest.  [name] MD [facility] 12/09/2018, 9:18 AM  CC: [name], MD

## 2018-12-09 NOTE — Patient Instructions (Signed)
I am glad you are doing well with your breathing Continue inhalers as prescribed Follow-up 1 year.

## 2018-12-11 ENCOUNTER — Other Ambulatory Visit: Payer: Self-pay | Admitting: Acute Care

## 2018-12-11 DIAGNOSIS — Z122 Encounter for screening for malignant neoplasm of respiratory organs: Secondary | ICD-10-CM

## 2018-12-11 DIAGNOSIS — Z87891 Personal history of nicotine dependence: Secondary | ICD-10-CM

## 2018-12-21 DIAGNOSIS — J069 Acute upper respiratory infection, unspecified: Secondary | ICD-10-CM | POA: Diagnosis not present

## 2018-12-24 DIAGNOSIS — J208 Acute bronchitis due to other specified organisms: Secondary | ICD-10-CM | POA: Diagnosis not present

## 2018-12-28 DIAGNOSIS — D3132 Benign neoplasm of left choroid: Secondary | ICD-10-CM | POA: Diagnosis not present

## 2019-01-14 DIAGNOSIS — E782 Mixed hyperlipidemia: Secondary | ICD-10-CM | POA: Diagnosis not present

## 2019-01-14 DIAGNOSIS — R7301 Impaired fasting glucose: Secondary | ICD-10-CM | POA: Diagnosis not present

## 2019-01-19 DIAGNOSIS — J410 Simple chronic bronchitis: Secondary | ICD-10-CM | POA: Diagnosis not present

## 2019-01-19 DIAGNOSIS — Z0000 Encounter for general adult medical examination without abnormal findings: Secondary | ICD-10-CM | POA: Diagnosis not present

## 2019-01-19 DIAGNOSIS — Z6825 Body mass index (BMI) 25.0-25.9, adult: Secondary | ICD-10-CM | POA: Diagnosis not present

## 2019-01-19 DIAGNOSIS — E782 Mixed hyperlipidemia: Secondary | ICD-10-CM | POA: Diagnosis not present

## 2019-01-19 DIAGNOSIS — E663 Overweight: Secondary | ICD-10-CM | POA: Diagnosis not present

## 2019-01-19 DIAGNOSIS — R7301 Impaired fasting glucose: Secondary | ICD-10-CM | POA: Diagnosis not present

## 2019-02-24 ENCOUNTER — Encounter: Payer: Self-pay | Admitting: Primary Care

## 2019-02-24 ENCOUNTER — Ambulatory Visit (INDEPENDENT_AMBULATORY_CARE_PROVIDER_SITE_OTHER)
Admission: RE | Admit: 2019-02-24 | Discharge: 2019-02-24 | Disposition: A | Discharge status: Home / Self Care | Payer: Medicare Other | Source: Ambulatory Visit | Attending: Primary Care | Admitting: Primary Care

## 2019-02-24 ENCOUNTER — Ambulatory Visit (INDEPENDENT_AMBULATORY_CARE_PROVIDER_SITE_OTHER): Payer: Medicare Other | Admitting: Primary Care

## 2019-02-24 VITALS — BP 148/64 | HR 90 | Temp 98.4°F | Ht 66.0 in | Wt 148.2 lb

## 2019-02-24 DIAGNOSIS — R05 Cough: Secondary | ICD-10-CM | POA: Diagnosis not present

## 2019-02-24 DIAGNOSIS — J449 Chronic obstructive pulmonary disease, unspecified: Secondary | ICD-10-CM | POA: Diagnosis not present

## 2019-02-24 MED ORDER — DOXYCYCLINE HYCLATE 100 MG PO TABS: 100.0000 mg | ORAL_TABLET | Freq: Two times a day (BID) | ORAL | 0 refills | Status: DC

## 2019-02-24 MED ORDER — PREDNISONE 10 MG PO TABS: ORAL_TABLET | ORAL | 0 refills | Status: DC

## 2019-02-24 MED ORDER — BUDESONIDE-FORMOTEROL FUMARATE 160-4.5 MCG/ACT IN AERO: 2.0000 | INHALATION_SPRAY | Freq: Two times a day (BID) | RESPIRATORY_TRACT | 0 refills | Status: DC

## 2019-02-24 MED ORDER — TIOTROPIUM BROMIDE MONOHYDRATE 2.5 MCG/ACT IN AERS: 2.0000 | INHALATION_SPRAY | Freq: Every day | RESPIRATORY_TRACT | 0 refills | Status: DC

## 2019-02-24 NOTE — Progress Notes (Signed)
@Patient  ID: Paul [name], male    DOB: Mar 19, 1946, 73 y.o.   MRN: [id number]  Chief Complaint  Patient presents with  . Follow-up    increasing cough and SOB    Referring provider: [name], MD  HPI: 73 year old male, smoker (under than 4-pack-year history).  History significant for COPD severe.  Patient of Dr. Vaughan Browner, last seen December 2019.  Contained on Symbicort and Spiriva.  He is on nocturnal oxygen.  Followed by lung cancer screening clinic, last CT in December when lung RADS 2.  Needs annual low-dose chest CT.  02/24/2019 Patient presents today for acute visit, accompanied by his wife.  Complains of increased productive cough with yellow mucus x2 weeks and increased shortness of breath with activity with reported oxygen desaturation.  States that his oxygen desaturated into the 80s after shower.  He is okay when walking but becomes more short of breath with heavy physical exertion.  Continues Symbicort and Spiriva as prescribed.  He has not tried using his nebulizer. He has not taken anything over-the-counter.  Has flutter valve home but has not started using it. No recent travel or sick contact. Afebrile.    No Known Allergies  Immunization History  Administered Date(s) Administered  . Influenza Split 09/22/2013, 09/01/2015  . Influenza Whole 09/04/2011, 09/21/2012  . Influenza, High Dose Seasonal PF 09/02/2016, 09/10/2017, 08/31/2018  . Influenza-Unspecified 08/23/2014  . Pneumococcal Conjugate-13 01/11/2014  . Pneumococcal Polysaccharide-23 08/23/2010    Past Medical History:  Diagnosis Date  . COPD (chronic obstructive pulmonary disease) ([place])   . High cholesterol     Tobacco History: Social History   Tobacco Use  Smoking Status Former Smoker  . Packs/day: 2.00  . Years: 52.00  . Pack years: 104.00  . Types: Cigarettes  . Last attempt to quit: 02/03/2009  . Years since quitting: 10.0  Smokeless Tobacco Never Used  Tobacco Comment   Counseled to remain  smoke free   Counseling given: Not Answered Comment: Counseled to remain smoke free   Outpatient Medications Prior to Visit  Medication Sig Dispense Refill  . albuterol (PROVENTIL HFA;VENTOLIN HFA) 108 (90 BASE) MCG/ACT inhaler Inhale 2 puffs into the lungs every 4 (four) hours as needed for wheezing or shortness of breath.    . budesonide-formoterol (SYMBICORT) 160-4.5 MCG/ACT inhaler Inhale 2 puffs into the lungs 2 (two) times daily. 1 Inhaler 0  . fish oil-omega-3 fatty acids 1000 MG capsule Take 2 g by mouth daily.    Marland Kitchen loratadine (CLARITIN) 10 MG tablet Take 10 mg by mouth daily.    . Multiple Vitamin (MULTIVITAMIN WITH MINERALS) TABS tablet Take 1 tablet by mouth daily.    Marland Kitchen Respiratory Therapy Supplies (FLUTTER) DEVI Use 2-3 times daily 1 each 0  . simvastatin (ZOCOR) 40 MG tablet Take 20 mg by mouth every evening.    . Tiotropium Bromide Monohydrate (SPIRIVA RESPIMAT) 2.5 MCG/ACT AERS Inhale 2 puffs into the lungs daily.    . budesonide-formoterol (SYMBICORT) 160-4.5 MCG/ACT inhaler Inhale 2 puffs into the lungs 2 (two) times daily. 2 Inhaler 0  . Tiotropium Bromide Monohydrate (SPIRIVA RESPIMAT) 2.5 MCG/ACT AERS Inhale 2 puffs into the lungs daily. 1 Inhaler 0   No facility-administered medications prior to visit.     Review of Systems  Review of Systems  Constitutional: Negative.   HENT: Negative.   Respiratory: Positive for cough and shortness of breath.   Cardiovascular: Negative.     Physical Exam  BP (!) 148/64 (BP Location: Left Arm,  Patient Position: Sitting, Cuff Size: Normal)   Pulse 90   Temp 98.4 F (36.9 C)   Ht 5\' 6"  (1.676 m)   Wt 148 lb 3.2 oz (67.2 kg)   SpO2 94%   BMI 23.92 kg/m  Physical Exam Constitutional:      Appearance: Normal appearance.  HENT:     Right Ear: Tympanic membrane normal.     Left Ear: Tympanic membrane normal.     Mouth/Throat:     Mouth: Mucous membranes are moist.     Pharynx: Oropharynx is clear.  Eyes:     Pupils:  Pupils are equal, round, and reactive to light.  Cardiovascular:     Rate and Rhythm: Normal rate and regular rhythm.  Pulmonary:     Effort: Pulmonary effort is normal.     Breath sounds: No wheezing.     Comments: Rales to left base Musculoskeletal: Normal range of motion.  Neurological:     General: No focal deficit present.     Mental Status: He is alert and oriented to person, place, and time. Mental status is at baseline.  Psychiatric:        Mood and Affect: Mood normal.        Behavior: Behavior normal.        Thought Content: Thought content normal.        Judgment: Judgment normal.      Lab Results:  CBC    Component Value Date/Time   WBC 14.1 (H) 01/30/2017 2313   RBC 5.12 01/30/2017 2313   HGB 12.9 (L) 01/30/2017 2313   HCT 40.9 01/30/2017 2313   PLT 259 01/30/2017 2313   MCV 79.9 01/30/2017 2313   MCH 25.2 (L) 01/30/2017 2313   MCHC 31.5 01/30/2017 2313   RDW 15.2 01/30/2017 2313   LYMPHSABS 1.9 01/30/2017 2313   MONOABS 0.8 01/30/2017 2313   EOSABS 0.6 01/30/2017 2313   BASOSABS 0.0 01/30/2017 2313    BMET    Component Value Date/Time   NA 135 01/30/2017 2313   K 3.9 01/30/2017 2313   CL 103 01/30/2017 2313   CO2 24 01/30/2017 2313   GLUCOSE 119 (H) 01/30/2017 2313   BUN 15 01/30/2017 2313   CREATININE 0.87 01/30/2017 2313   CALCIUM 9.2 01/30/2017 2313   GFRNONAA >60 01/30/2017 2313   GFRAA >60 01/30/2017 2313    BNP No results found for: BNP  ProBNP No results found for: PROBNP  Imaging: No results found.   Assessment & Plan:   COPD (chronic obstructive pulmonary disease), gold stage C. - Seen today for acute COPD exacerbation - Ordered for CXR today  - RX Doxycycline 1 tab twice daily x7 days and Prednisone taper as prescribed (40 mg x 2 days, 30 mg x 2 days, 20 mg x 2 days, 10 mg x 2 days)  Recommendations: - Continue Spiriva 2.5 qd and Symbicort 160 2bid  - Mucinex twice daily for congestion/cough - Use flutter valve every  hour while awake for congestion/cough - Use your nebulizer 2-3 times a day when COPD symptoms are flared  Follow-up: - 2 months with Dr. Vaughan Browner and or if symptoms do not improve or worsen in any way    Martyn Ehrich, NP 02/24/2019

## 2019-02-24 NOTE — Patient Instructions (Addendum)
Seen today for acute COPD exacerbation Ambulatory O2 sat today was normal, monitor for desaturation at home <88 and does not cover with rest  Orders: Chest x-ray today  RX: Doxycycline 1 tab twice daily x7 days Prednisone taper as prescribed (40 mg x 2 days, 30 mg x 2 days, 20 mg x 2 days, 10 mg x 2 days)  Recommendations: Continue Spiriva and Symbicort as directed (sample given) Take Mucinex twice daily for congestion/cough Use flutter valve every hour while awake for congestion/cough Use your nebulizer 2-3 times a day when COPD symptoms are flared  Follow-up: 2 months with Dr. Vaughan Browner and or if symptoms do not improve or worsen in any way

## 2019-02-24 NOTE — Addendum Note (Signed)
Addended by: Joella Prince on: 02/24/2019 04:08 PM   Modules accepted: Orders

## 2019-02-24 NOTE — Assessment & Plan Note (Addendum)
-   Seen today for acute COPD exacerbation - Ordered for CXR today  - RX Doxycycline 1 tab twice daily x7 days and Prednisone taper as prescribed (40 mg x 2 days, 30 mg x 2 days, 20 mg x 2 days, 10 mg x 2 days)  Recommendations: - Continue Spiriva 2.5 qd and Symbicort 160 2bid  - Mucinex twice daily for congestion/cough - Use flutter valve every hour while awake for congestion/cough - Use your nebulizer 2-3 times a day when COPD symptoms are flared  Follow-up: - 2 months with Dr. Vaughan Browner and or if symptoms do not improve or worsen in any way

## 2019-02-25 ENCOUNTER — Telehealth: Payer: Self-pay | Admitting: Pulmonary Disease

## 2019-02-25 NOTE — Telephone Encounter (Signed)
Pt is returning call about results. Per Pt his phone is not working and requesting call back on wife's number. Cb is [phone]

## 2019-02-25 NOTE — Telephone Encounter (Signed)
Noted.  Will closed message.

## 2019-02-25 NOTE — Telephone Encounter (Signed)
Returned call to patient.  CXR results given as per Paul Barrow, NP review:  CXR showed findings consistent with hx COPD; no evidence of pneumonia or acute cardiopulmonary disease. Continue previous recommendations .  Patient acknowledged understanding of results and nothing further needed at this time.

## 2019-02-28 ENCOUNTER — Emergency Department (HOSPITAL_BASED_OUTPATIENT_CLINIC_OR_DEPARTMENT_OTHER): Payer: Medicare Other

## 2019-02-28 ENCOUNTER — Emergency Department (HOSPITAL_BASED_OUTPATIENT_CLINIC_OR_DEPARTMENT_OTHER)
Admission: EM | Admit: 2019-02-28 | Discharge: 2019-02-28 | Disposition: A | Discharge status: Home / Self Care | Payer: Medicare Other | Attending: Emergency Medicine | Admitting: Emergency Medicine

## 2019-02-28 ENCOUNTER — Other Ambulatory Visit: Payer: Self-pay

## 2019-02-28 ENCOUNTER — Encounter (HOSPITAL_BASED_OUTPATIENT_CLINIC_OR_DEPARTMENT_OTHER): Payer: Self-pay | Admitting: Emergency Medicine

## 2019-02-28 DIAGNOSIS — R05 Cough: Secondary | ICD-10-CM | POA: Diagnosis not present

## 2019-02-28 DIAGNOSIS — J441 Chronic obstructive pulmonary disease with (acute) exacerbation: Secondary | ICD-10-CM | POA: Insufficient documentation

## 2019-02-28 DIAGNOSIS — Z79899 Other long term (current) drug therapy: Secondary | ICD-10-CM | POA: Insufficient documentation

## 2019-02-28 DIAGNOSIS — R0602 Shortness of breath: Secondary | ICD-10-CM | POA: Diagnosis not present

## 2019-02-28 DIAGNOSIS — Z87891 Personal history of nicotine dependence: Secondary | ICD-10-CM | POA: Insufficient documentation

## 2019-02-28 DIAGNOSIS — R0789 Other chest pain: Secondary | ICD-10-CM | POA: Diagnosis not present

## 2019-02-28 LAB — CBC WITH DIFFERENTIAL/PLATELET
Abs Immature Granulocytes: 0.02 10*3/uL (ref 0.00–0.07)
BASOS ABS: 0 10*3/uL (ref 0.0–0.1)
Basophils Relative: 0 %
EOS PCT: 3 %
Eosinophils Absolute: 0.3 10*3/uL (ref 0.0–0.5)
HCT: 39.1 % (ref 39.0–52.0)
HEMOGLOBIN: 12 g/dL — AB (ref 13.0–17.0)
Immature Granulocytes: 0 %
LYMPHS PCT: 19 %
Lymphs Abs: 1.9 10*3/uL (ref 0.7–4.0)
MCH: 25.5 pg — AB (ref 26.0–34.0)
MCHC: 30.7 g/dL (ref 30.0–36.0)
MCV: 83 fL (ref 80.0–100.0)
MONO ABS: 0.6 10*3/uL (ref 0.1–1.0)
Monocytes Relative: 6 %
NRBC: 0 % (ref 0.0–0.2)
Neutro Abs: 6.9 10*3/uL (ref 1.7–7.7)
Neutrophils Relative %: 72 %
Platelets: 290 10*3/uL (ref 150–400)
RBC: 4.71 MIL/uL (ref 4.22–5.81)
RDW: 14.6 % (ref 11.5–15.5)
WBC: 9.7 10*3/uL (ref 4.0–10.5)

## 2019-02-28 LAB — BASIC METABOLIC PANEL
Anion gap: 6 (ref 5–15)
BUN: 10 mg/dL (ref 8–23)
CHLORIDE: 103 mmol/L (ref 98–111)
CO2: 28 mmol/L (ref 22–32)
CREATININE: 0.6 mg/dL — AB (ref 0.61–1.24)
Calcium: 8.6 mg/dL — ABNORMAL LOW (ref 8.9–10.3)
Glucose, Bld: 111 mg/dL — ABNORMAL HIGH (ref 70–99)
POTASSIUM: 3.5 mmol/L (ref 3.5–5.1)
SODIUM: 137 mmol/L (ref 135–145)

## 2019-02-28 MED ORDER — METHYLPREDNISOLONE SODIUM SUCC 125 MG IJ SOLR
80.0000 mg | Freq: Once | INTRAMUSCULAR | Status: AC
  Administered 2019-02-28: 80 mg via INTRAVENOUS
  Filled 2019-02-28: qty 2

## 2019-02-28 MED ORDER — IPRATROPIUM-ALBUTEROL 0.5-2.5 (3) MG/3ML IN SOLN
3.0000 mL | Freq: Once | RESPIRATORY_TRACT | Status: AC
  Administered 2019-02-28: 3 mL via RESPIRATORY_TRACT
  Filled 2019-02-28: qty 3

## 2019-02-28 MED ORDER — BENZONATATE 100 MG PO CAPS: 100.0000 mg | ORAL_CAPSULE | Freq: Three times a day (TID) | ORAL | 0 refills | Status: DC

## 2019-02-28 MED ORDER — IPRATROPIUM-ALBUTEROL 0.5-2.5 (3) MG/3ML IN SOLN: 3.0000 mL | Freq: Four times a day (QID) | RESPIRATORY_TRACT | Status: DC

## 2019-02-28 MED ORDER — ALBUTEROL SULFATE (2.5 MG/3ML) 0.083% IN NEBU: 2.5000 mg | INHALATION_SOLUTION | Freq: Four times a day (QID) | RESPIRATORY_TRACT | 12 refills | Status: DC | PRN

## 2019-02-28 NOTE — ED Triage Notes (Signed)
Patient states that he has had some SOB x 1 week - the patient reports that he went to his Pulmonologist earlier this week because he was feeling this way. He reports that he gets more SOB with excertion. The patient also states that he has to use more O2 at home and he is not getting better with the meds given to him by the lung dr

## 2019-02-28 NOTE — ED Notes (Addendum)
Pt ambulated well, but SpO2 went to 92% and HR to 101 after walking about 30 feet. PA notified.

## 2019-02-28 NOTE — Discharge Instructions (Signed)
Continue taking the steroids and antibiotics were given you by your pulmonologist.  Use albuterol nebulizer treatments as directed.  Follow-up with your primary care doctor and pulmonologist this week.  Return the emergency department for any chest pain, difficulty breathing, fevers or any other worsening concerning symptoms.

## 2019-02-28 NOTE — ED Provider Notes (Signed)
[facility] DEPART[facility] Provider Note   CSN: [id number] Arrival date & time: 02/28/19  1093    History   Chief Complaint Chief Complaint  Patient presents with  . Cough    HPI Paul [name] is a 73 y.o. male past medical history of COPD who presents for evaluation of 1 week of progressive worsening shortness of breath and cough.  Patient reports that he was seen by his pulmonologist on 02/24/19 for evaluation of same symptoms.  At that time, he was diagnosed with COPD exacerbation and given a course of prednisone, doxycycline and told to increase nebulizer treatment.  Patient states that he has been taking the medications but still feels like he is short of breath.  He does note that he looked at the albuterol nebulizer solution and realized that it was expired.  He has a home oxygen monitor and states that it popped down to 80s last night.  He has oxygen that he wears at night and last night, he had to wear his emergency oxygen.  He states that the shortness of breath is worse whenever he gets into a fit of coughing and with activity.  Patient does report that he is able to walk some short distances without feeling that he gets short of breath or cough but if he starts coughing while walking, he feels like he cannot breathe.  Patient states that he is coughing up some phlegm.  No blood.  Patient has not noted any fever.  He has not had any chest pain but does report some chest tightness.  Patient states he has not had any abdominal pain, nausea/vomiting.  Patient denies any recent travel, leg swelling, history of surgeries or hospitalizations.     The history is provided by the patient.    Past Medical History:  Diagnosis Date  . COPD (chronic obstructive pulmonary disease) ([place])   . High cholesterol     Patient Active Problem List   Diagnosis Date Noted  . Lung nodule < 6cm on CT 05/29/2016  . Former cigarette smoker 12/05/2015  . Chronic respiratory failure with  hypoxia ([place]) 01/27/2013  . COPD (chronic obstructive pulmonary disease), gold stage C.   . High cholesterol     Past Surgical History:  Procedure Laterality Date  . CATARACT EXTRACTION Right   . SKIN SURGERY     shoulders and chest        Home Medications    Prior to Admission medications   Medication Sig Start Date End Date Taking? Authorizing Provider  albuterol (PROVENTIL) (2.5 MG/3ML) 0.083% nebulizer solution Take 3 mLs (2.5 mg total) by nebulization every 6 (six) hours as needed for wheezing or shortness of breath. 02/28/19   [name], PA-C  benzonatate (TESSALON) 100 MG capsule Take 1 capsule (100 mg total) by mouth every 8 (eight) hours. 02/28/19   [name], PA-C  budesonide-formoterol (SYMBICORT) 160-4.5 MCG/ACT inhaler Inhale 2 puffs into the lungs 2 (two) times daily. 12/09/18   Mannam, Hart Robinsons, MD  budesonide-formoterol (SYMBICORT) 160-4.5 MCG/ACT inhaler Inhale 2 puffs into the lungs 2 (two) times daily for 1 day. 02/24/19 02/25/19  Martyn Ehrich, NP  doxycycline (VIBRA-TABS) 100 MG tablet Take 1 tablet (100 mg total) by mouth 2 (two) times daily. 02/24/19   Martyn Ehrich, NP  fish oil-omega-3 fatty acids 1000 MG capsule Take 2 g by mouth daily.    [provider]  loratadine (CLARITIN) 10 MG tablet Take 10 mg by mouth daily.  [provider]  Multiple Vitamin (MULTIVITAMIN WITH MINERALS) TABS tablet Take 1 tablet by mouth daily.    [provider]  predniSONE (DELTASONE) 10 MG tablet Take 4 tabs po daily x 2 days; then 3 tabs for 2 days; then 2 tabs for 2 days; then 1 tab for 2 days 02/24/19   Martyn Ehrich, NP  Respiratory Therapy Supplies (FLUTTER) DEVI Use 2-3 times daily 07/04/15   Elsie Stain, MD  simvastatin (ZOCOR) 40 MG tablet Take 20 mg by mouth every evening.    [provider]  Tiotropium Bromide Monohydrate (SPIRIVA RESPIMAT) 2.5 MCG/ACT AERS Inhale 2 puffs into the lungs daily.    [provider]  Tiotropium Bromide Monohydrate (SPIRIVA RESPIMAT) 2.5 MCG/ACT AERS Inhale 2 puffs into the lungs daily for 1 day. 02/24/19 02/25/19  Martyn Ehrich, NP    Family History Family History  Problem Relation Age of Onset  . Heart disease Brother   . Heart disease Mother   . Heart disease Father   . Cancer Paternal Uncle     Social History Social History   Tobacco Use  . Smoking status: Former Smoker    Packs/day: 2.00    Years: 52.00    Pack years: 104.00    Types: Cigarettes    Last attempt to quit: 02/03/2009    Years since quitting: 10.0  . Smokeless tobacco: Never Used  . Tobacco comment: Counseled to remain smoke free  Substance Use Topics  . Alcohol use: Yes    Alcohol/week: 0.0 standard drinks    Comment: Beer & Wine occasionally.  . Drug use: No     Allergies   Patient has no known allergies.   Review of Systems Review of Systems  Constitutional: Negative for fever.  HENT: Negative for congestion.   Eyes: Negative for visual disturbance.  Respiratory: Positive for cough, chest tightness and shortness of breath.   Cardiovascular: Negative for chest pain.  Gastrointestinal: Negative for abdominal pain, nausea and vomiting.  Genitourinary: Negative for dysuria and hematuria.  Skin: Negative for rash.  Neurological: Negative for dizziness, weakness, numbness and headaches.  All other systems reviewed and are negative.    Physical Exam Updated Vital Signs BP (!) 166/83 (BP Location: Left Arm)   Pulse 93   Temp 98.4 F (36.9 C) (Oral)   Resp (!) 24   Ht 5\' 6"  (1.676 m)   Wt 67.2 kg   SpO2 98%   BMI 23.91 kg/m   Physical Exam Vitals signs and nursing note reviewed.  Constitutional:      Appearance: Normal appearance. He is well-developed.  HENT:     Head: Normocephalic and atraumatic.  Eyes:     General: Lids are normal.     Conjunctiva/sclera: Conjunctivae normal.     Pupils: Pupils are equal, round, and reactive to light.  Neck:      Musculoskeletal: Full passive range of motion without pain.  Cardiovascular:     Rate and Rhythm: Normal rate and regular rhythm.     Pulses: Normal pulses.          Radial pulses are 2+ on the right side and 2+ on the left side.       Dorsalis pedis pulses are 2+ on the right side and 2+ on the left side.     Heart sounds: Normal heart sounds. No murmur. No friction rub. No gallop.   Pulmonary:     Effort: Pulmonary effort is normal.  Breath sounds: Decreased air movement present.     Comments: Slight decreased air movement with some expiratory wheezes noted in the lower lung fields.  No evidence of respiratory distress.  Able to speak in full sentences without any difficulty. Abdominal:     Palpations: Abdomen is soft. Abdomen is not rigid.     Tenderness: There is no abdominal tenderness. There is no guarding.  Musculoskeletal: Normal range of motion.     Comments: Bilateral lower extremities are symmetric in appearance without any overlying warmth, erythema, edema.  Skin:    General: Skin is warm and dry.     Capillary Refill: Capillary refill takes less than 2 seconds.  Neurological:     Mental Status: He is alert and oriented to person, place, and time.  Psychiatric:        Speech: Speech normal.      ED Treatments / Results  Labs (all labs ordered are listed, but only abnormal results are displayed) Labs Reviewed  BASIC METABOLIC PANEL - Abnormal; Notable for the following components:      Result Value   Glucose, Bld 111 (*)    Creatinine, Ser 0.60 (*)    Calcium 8.6 (*)    All other components within normal limits  CBC WITH DIFFERENTIAL/PLATELET - Abnormal; Notable for the following components:   Hemoglobin 12.0 (*)    MCH 25.5 (*)    All other components within normal limits    EKG None  Radiology Dg Chest 2 View  Result Date: 02/28/2019 CLINICAL DATA:  73 year-old male c/o non-productive cough x 1 week. Pt denies fever. Pt went to his pulmonologist  this week and was given abx, prednisone and had a chest xray on 02/24/2019 but isn't feeling relief. Hx of COPD EXAM: CHEST - 2 VIEW COMPARISON:  02/24/2019 FINDINGS: Midline trachea. Normal heart size. Atherosclerosis in the transverse aorta. No pleural effusion or pneumothorax. Mild biapical pleural thickening. Diffuse peribronchial thickening. No lobar consolidation. IMPRESSION: 1. No acute cardiopulmonary disease. 2. Mild peribronchial thickening which may relate to chronic bronchitis or smoking. 3.  Aortic Atherosclerosis (ICD10-I70.0). Electronically Signed   By: [name] Miyamoto M.D.   On: 02/28/2019 11:04    Procedures Procedures (including critical care time)  Medications Ordered in ED Medications  ipratropium-albuterol (DUONEB) 0.5-2.5 (3) MG/3ML nebulizer solution 3 mL (3 mLs Nebulization Given 02/28/19 1009)  methylPREDNISolone sodium succinate (SOLU-MEDROL) 125 mg/2 mL injection 80 mg (80 mg Intravenous Given 02/28/19 1020)  ipratropium-albuterol (DUONEB) 0.5-2.5 (3) MG/3ML nebulizer solution 3 mL (3 mLs Nebulization Given 02/28/19 1226)     Initial Impression / Assessment and Plan / ED Course  I have reviewed the triage vital signs and the nursing notes.  Pertinent labs & imaging results that were available during my care of the patient were reviewed by me and considered in my medical decision making (see chart for details).        73 year old male past medical history of COPD who presents for evaluation of cough, shortness of breath x1 week.  Seen by pulmonology on 02/24/19 who prescribed him doxycycline, Spiriva, prednisone instruction use albuterol utilizer.  Patient states that the nebulizer treatments is expired.  Patient states he comes in today because he continues to have worsening cough, shortness of breath.  He also reports he feels some chest tightness.  No fevers, abdominal pain, nausea/vomiting.  No recent hospitalizations, surgeries, leg swelling, history of blood clots.  No  recent travel. Patient is afebrile, non-toxic appearing, sitting comfortably on  examination table. Vital signs reviewed and stable.  On exam, he has some decreased breath sounds noted with some mild expiratory wheezing.  Otherwise exam unremarkable.  Consider COPD exacerbation versus infectious etiology.  History/physical exam not concerning for PE, ACS etiology.  Plan for nebs, labs, chest x-ray.  Chest x-ray reviewed.  No evidence of infectious etiology.  CBC shows no evidence of leukocytosis.  Hemoglobin is 12.0.  Review of records show that he has been in the 12's before.  CMP unremarkable.  Reevaluation after initial nebulizer treatment.  Patient sounds improved.  Lung sounds opened up significantly.  Patient states he feels better but he is concerned about when he coughs.  We will plan to ambulate patient and check pulse ox.  Patient was able to ambulate with pulse ox going to 92% on room air.  He did start coughing but otherwise was able to ambulate without any difficulty.  Discussed plan with patient.  I discussed with patient that given significant attempts by pulmonologist for management of COPD exacerbation, there is not much that we would add on to his treatment plan.  We will plan to refill nebulizer solution so that it is not expired.  I did offer admission if patient was not feeling better.  Patient states he would like to try another breathing treatment and then decide.  Reevaluation after second nebulizer treatment.  Patient with much improved lung sounds.  Patient states he feels okay.  I again offered admission but patient states he would rather go home.  At this time, he is not hypoxic.  Lungs clear to auscultation with no evidence of wheezing.  No signs of respiratory distress.  I feel that this is reasonable. Discussed patient with Dr. Laverta Baltimore is agreeable to plan.  Instructed patient to closely monitor symptoms and return emergency department if he worsens.  Instructed patient to  follow-up with his pulmonologist this week. Patient had ample opportunity for questions and discussion. All patient's questions were answered with full understanding. Strict return precautions discussed. Patient expresses understanding and agreement to plan.   Portions of this note were generated with [employer]. Dictation errors may occur despite best attempts at proofreading.   Final Clinical Impressions(s) / ED Diagnoses   Final diagnoses:  COPD exacerbation [facility])    ED Discharge Orders         Ordered    albuterol (PROVENTIL) (2.5 MG/3ML) 0.083% nebulizer solution  Every 6 hours PRN     02/28/19 1308    benzonatate (TESSALON) 100 MG capsule  Every 8 hours     02/28/19 1308           Desma Mcgregor 02/28/19 1405    Margette Fast, MD 02/28/19 2017

## 2019-03-01 ENCOUNTER — Telehealth: Payer: Self-pay | Admitting: Pulmonary Disease

## 2019-03-01 NOTE — Telephone Encounter (Signed)
Patients' wife calling to let office know pt went to the [facility] on Sunday 02/28/19 for difficulty breathing. Patient received breathing treatments, IV steroids, and cough pearls. Patient has a follow up appt in 2 months just wanted to know if Dr. Vaughan Browner wants to see patient sooner. pattient's wife states he is doing better since leaving the med center

## 2019-03-01 NOTE — Telephone Encounter (Signed)
Called and spoke with pt's wife Paul [name] stating to her that pt could keep scheduled OV but if he did become worse to call office and we could then get him in for a sooner appt. Paul [name] expressed understanding. Nothing further needed.

## 2019-03-01 NOTE — Telephone Encounter (Signed)
Called and spoke with pt's wife Paul [name] in regards to the message that was sent back from [place] about pt going to [facility] yesterday, 02/28/2019. Asked Paul [name] how pt was feeling after visit yesterday and she stated that pt is feeling much better after the treatments that were done.  Asked her if pt was having any wheezing and Paul [name] stated that she does not hear any wheezing.  Dr. Vaughan Browner, please advise based on the visit yesterday that pt had at [facility] if you think that pt needs to come in sooner for an appt than current scheduled appt of 04/26/2019 with you or if pt could just keep that scheduled OV? Thanks!

## 2019-03-01 NOTE — Telephone Encounter (Signed)
If he is feeling better than keep scheduled office visit.

## 2019-03-03 ENCOUNTER — Other Ambulatory Visit: Payer: Self-pay

## 2019-03-03 ENCOUNTER — Emergency Department (HOSPITAL_BASED_OUTPATIENT_CLINIC_OR_DEPARTMENT_OTHER): Payer: Medicare Other

## 2019-03-03 ENCOUNTER — Encounter (HOSPITAL_BASED_OUTPATIENT_CLINIC_OR_DEPARTMENT_OTHER): Payer: Self-pay

## 2019-03-03 ENCOUNTER — Telehealth: Payer: Self-pay | Admitting: Pulmonary Disease

## 2019-03-03 ENCOUNTER — Emergency Department (HOSPITAL_BASED_OUTPATIENT_CLINIC_OR_DEPARTMENT_OTHER)
Admission: EM | Admit: 2019-03-03 | Discharge: 2019-03-03 | Disposition: A | Discharge status: Home / Self Care | Payer: Medicare Other | Attending: Emergency Medicine | Admitting: Emergency Medicine

## 2019-03-03 DIAGNOSIS — Z79899 Other long term (current) drug therapy: Secondary | ICD-10-CM | POA: Insufficient documentation

## 2019-03-03 DIAGNOSIS — Z87891 Personal history of nicotine dependence: Secondary | ICD-10-CM | POA: Diagnosis not present

## 2019-03-03 DIAGNOSIS — R0602 Shortness of breath: Secondary | ICD-10-CM | POA: Diagnosis not present

## 2019-03-03 DIAGNOSIS — R05 Cough: Secondary | ICD-10-CM | POA: Diagnosis not present

## 2019-03-03 DIAGNOSIS — J441 Chronic obstructive pulmonary disease with (acute) exacerbation: Secondary | ICD-10-CM | POA: Diagnosis not present

## 2019-03-03 DIAGNOSIS — R000 Tachycardia, unspecified: Secondary | ICD-10-CM | POA: Diagnosis not present

## 2019-03-03 LAB — CBC WITH DIFFERENTIAL/PLATELET
Abs Immature Granulocytes: 0.03 10*3/uL (ref 0.00–0.07)
BASOS PCT: 0 %
Basophils Absolute: 0 10*3/uL (ref 0.0–0.1)
EOS ABS: 0.3 10*3/uL (ref 0.0–0.5)
Eosinophils Relative: 3 %
HCT: 47.4 % (ref 39.0–52.0)
Hemoglobin: 14 g/dL (ref 13.0–17.0)
Immature Granulocytes: 0 %
Lymphocytes Relative: 12 %
Lymphs Abs: 1.5 10*3/uL (ref 0.7–4.0)
MCH: 25 pg — ABNORMAL LOW (ref 26.0–34.0)
MCHC: 29.5 g/dL — ABNORMAL LOW (ref 30.0–36.0)
MCV: 84.6 fL (ref 80.0–100.0)
Monocytes Absolute: 0.5 10*3/uL (ref 0.1–1.0)
Monocytes Relative: 4 %
Neutro Abs: 10.2 10*3/uL — ABNORMAL HIGH (ref 1.7–7.7)
Neutrophils Relative %: 81 %
Platelets: 368 10*3/uL (ref 150–400)
RBC: 5.6 MIL/uL (ref 4.22–5.81)
RDW: 14.5 % (ref 11.5–15.5)
WBC: 12.5 10*3/uL — ABNORMAL HIGH (ref 4.0–10.5)
nRBC: 0 % (ref 0.0–0.2)

## 2019-03-03 LAB — URINALYSIS, ROUTINE W REFLEX MICROSCOPIC
Bilirubin Urine: NEGATIVE
GLUCOSE, UA: NEGATIVE mg/dL
Hgb urine dipstick: NEGATIVE
KETONES UR: NEGATIVE mg/dL
Leukocytes,Ua: NEGATIVE
Nitrite: NEGATIVE
Protein, ur: NEGATIVE mg/dL
Specific Gravity, Urine: <1.005 — ABNORMAL LOW (ref 1.005–1.030)
pH: 6.5 (ref 5.0–8.0)

## 2019-03-03 LAB — COMPREHENSIVE METABOLIC PANEL
ALK PHOS: 68 U/L (ref 38–126)
ALT: 26 U/L (ref 0–44)
AST: 23 U/L (ref 15–41)
Albumin: 4.4 g/dL (ref 3.5–5.0)
Anion gap: 9 (ref 5–15)
BUN: 11 mg/dL (ref 8–23)
CALCIUM: 9.1 mg/dL (ref 8.9–10.3)
CO2: 28 mmol/L (ref 22–32)
Chloride: 97 mmol/L — ABNORMAL LOW (ref 98–111)
Creatinine, Ser: 0.67 mg/dL (ref 0.61–1.24)
GFR calc Af Amer: >60 mL/min (ref >60)
Glucose, Bld: 135 mg/dL — ABNORMAL HIGH (ref 70–99)
Potassium: 4.1 mmol/L (ref 3.5–5.1)
Sodium: 134 mmol/L — ABNORMAL LOW (ref 135–145)
TOTAL PROTEIN: 8 g/dL (ref 6.5–8.1)
Total Bilirubin: 0.5 mg/dL (ref 0.3–1.2)

## 2019-03-03 LAB — PROTIME-INR
INR: 0.9 (ref 0.8–1.2)
Prothrombin Time: 12.3 seconds (ref 11.4–15.2)

## 2019-03-03 LAB — LACTIC ACID, PLASMA: Lactic Acid, Venous: 1.7 mmol/L (ref 0.5–1.9)

## 2019-03-03 MED ORDER — IPRATROPIUM-ALBUTEROL 0.5-2.5 (3) MG/3ML IN SOLN
3.0000 mL | Freq: Four times a day (QID) | RESPIRATORY_TRACT | Status: DC
  Administered 2019-03-03: 3 mL via RESPIRATORY_TRACT
  Filled 2019-03-03: qty 3

## 2019-03-03 MED ORDER — PREDNISONE 50 MG PO TABS
60.0000 mg | ORAL_TABLET | Freq: Once | ORAL | Status: AC
  Administered 2019-03-03: 60 mg via ORAL
  Filled 2019-03-03: qty 1

## 2019-03-03 MED ORDER — PREDNISONE 10 MG PO TABS: 40.0000 mg | ORAL_TABLET | Freq: Every day | ORAL | 0 refills | Status: DC

## 2019-03-03 MED FILL — predniSONE 10 MG TABS: 10 | 5 days supply | Qty: 20 | Fill #0

## 2019-03-03 NOTE — ED Notes (Signed)
ED Provider at bedside. 

## 2019-03-03 NOTE — ED Triage Notes (Signed)
C/o heart racing, SOB ~9am-denies pain-DOE-steady gait

## 2019-03-03 NOTE — Discharge Instructions (Signed)
Take the prednisone as directed for the next 5 days.  Make an appointment to follow-up with your pulmonologist.  Use 2 L of oxygen at home day or night as needed.  Use your albuterol nebulizer or inhaler every 6 hours.  Today's work-up without any acute findings.  Chest x-ray without evidence of pneumonia.  You seemed to improve significantly with the nebulizer treatment here.  Also given your first dose of steroids here today next dose will be tomorrow.

## 2019-03-04 ENCOUNTER — Encounter: Payer: Self-pay | Admitting: Nurse Practitioner

## 2019-03-04 ENCOUNTER — Ambulatory Visit (INDEPENDENT_AMBULATORY_CARE_PROVIDER_SITE_OTHER): Payer: Medicare Other | Admitting: Nurse Practitioner

## 2019-03-04 DIAGNOSIS — J449 Chronic obstructive pulmonary disease, unspecified: Secondary | ICD-10-CM | POA: Diagnosis not present

## 2019-03-04 MED ORDER — TIOTROPIUM BROMIDE MONOHYDRATE 2.5 MCG/ACT IN AERS: 2.0000 | INHALATION_SPRAY | Freq: Every day | RESPIRATORY_TRACT | 0 refills | Status: DC

## 2019-03-04 MED ORDER — BUDESONIDE-FORMOTEROL FUMARATE 160-4.5 MCG/ACT IN AERO: 2.0000 | INHALATION_SPRAY | Freq: Two times a day (BID) | RESPIRATORY_TRACT | 0 refills | Status: DC

## 2019-03-04 NOTE — Telephone Encounter (Signed)
Seems like encounter was open in error so closing encounter.  

## 2019-03-04 NOTE — Assessment & Plan Note (Signed)
Exacerbation is resolving.  Patient is doing much better today.  Advised him to complete prednisone as ordered in the ED and continue inhalers.  Patient Instructions  Continue Symbicort Continue Spiriva Continue Proventil as needed Please complete course of prednisone as directed from the ED Continue flutter valve device  Follow up: Follow up with Dr. Vaughan Browner as already scheduled

## 2019-03-04 NOTE — Progress Notes (Signed)
@Patient  ID: Paul [name], male    DOB: [birth year], 73 y.o.   MRN: [id number]  Chief Complaint  Patient presents with  . Hospitalization Follow-up    Was seen in ER 02/28/19 and 03/03/19 due to COPD    Referring provider: [name], MD  HPI  73 year old male former smoker with severe COPD and chronic respiratory failure (2 L  at night) is followed by Dr. Vaughan Browner.  Tests: CXR 03/03/19 - Mild peribronchial thickening noted. Lungs otherwise clear.  OV 03/04/19 - Hospital follow up Patient presents today for hospital follow-up.  He was seen in the ED yesterday for COPD exacerbation.  He was started on prednisone 40 mg daily for 5 days.  He was given nebulizer treatments in the ED.  He was also prescribed Tessalon Perles.  He states that he feels much better today.  He is compliant with Spiriva, Symbicort, and Proventil as needed.  Patient uses flutter valve device daily. Denies f/c/s, n/v/d, hemoptysis, PND, leg swelling.  He is requesting samples of his inhalers today.    No Known Allergies  Immunization History  Administered Date(s) Administered  . Influenza Split 09/22/2013, 09/01/2015  . Influenza Whole 09/04/2011, 09/21/2012  . Influenza, High Dose Seasonal PF 09/02/2016, 09/10/2017, 08/31/2018  . Influenza-Unspecified 08/23/2014  . Pneumococcal Conjugate-13 01/11/2014  . Pneumococcal Polysaccharide-23 08/23/2010    Past Medical History:  Diagnosis Date  . COPD (chronic obstructive pulmonary disease) ([place])   . High cholesterol     Tobacco History: Social History   Tobacco Use  Smoking Status Former Smoker  . Packs/day: 2.00  . Years: 52.00  . Pack years: 104.00  . Types: Cigarettes  . Last attempt to quit: 02/03/2009  . Years since quitting: 10.0  Smokeless Tobacco Never Used  Tobacco Comment   Counseled to remain smoke free   Counseling given: Not Answered Comment: Counseled to remain smoke free   Outpatient Encounter Medications as of 03/04/2019   Medication Sig  . albuterol (PROVENTIL) (2.5 MG/3ML) 0.083% nebulizer solution Take 3 mLs (2.5 mg total) by nebulization every 6 (six) hours as needed for wheezing or shortness of breath.  . benzonatate (TESSALON) 100 MG capsule Take 1 capsule (100 mg total) by mouth every 8 (eight) hours.  . budesonide-formoterol (SYMBICORT) 160-4.5 MCG/ACT inhaler Inhale 2 puffs into the lungs 2 (two) times daily.  Marland Kitchen doxycycline (VIBRA-TABS) 100 MG tablet Take 1 tablet (100 mg total) by mouth 2 (two) times daily.  . fish oil-omega-3 fatty acids 1000 MG capsule Take 2 g by mouth daily.  Marland Kitchen loratadine (CLARITIN) 10 MG tablet Take 10 mg by mouth daily.  . Multiple Vitamin (MULTIVITAMIN WITH MINERALS) TABS tablet Take 1 tablet by mouth daily.  . predniSONE (DELTASONE) 10 MG tablet Take 4 tablets (40 mg total) by mouth daily.  Marland Kitchen Respiratory Therapy Supplies (FLUTTER) DEVI Use 2-3 times daily  . simvastatin (ZOCOR) 40 MG tablet Take 20 mg by mouth every evening.  . Tiotropium Bromide Monohydrate (SPIRIVA RESPIMAT) 2.5 MCG/ACT AERS Inhale 2 puffs into the lungs daily.  . [DISCONTINUED] budesonide-formoterol (SYMBICORT) 160-4.5 MCG/ACT inhaler Inhale 2 puffs into the lungs 2 (two) times daily for 1 day.  . [DISCONTINUED] predniSONE (DELTASONE) 10 MG tablet Take 4 tabs po daily x 2 days; then 3 tabs for 2 days; then 2 tabs for 2 days; then 1 tab for 2 days (Patient not taking: Reported on 03/04/2019)  . [DISCONTINUED] Tiotropium Bromide Monohydrate (SPIRIVA RESPIMAT) 2.5 MCG/ACT AERS Inhale 2 puffs into the lungs  daily for 1 day.   No facility-administered encounter medications on file as of 03/04/2019.      Review of Systems  Review of Systems  Constitutional: Negative.  Negative for chills and fever.  HENT: Negative.   Respiratory: Negative for cough, shortness of breath and wheezing.   Cardiovascular: Negative.  Negative for chest pain, palpitations and leg swelling.  Gastrointestinal: Negative.    Allergic/Immunologic: Negative.   Neurological: Negative.   Psychiatric/Behavioral: Negative.        Physical Exam  BP 140/60 (BP Location: Right Arm, Patient Position: Sitting, Cuff Size: Normal)   Pulse 97   Ht 5\' 6"  (1.676 m)   Wt 144 lb 9.6 oz (65.6 kg)   SpO2 96%   BMI 23.34 kg/m   Wt Readings from Last 5 Encounters:  03/04/19 144 lb 9.6 oz (65.6 kg)  03/03/19 146 lb (66.2 kg)  02/28/19 148 lb 2.4 oz (67.2 kg)  02/24/19 148 lb 3.2 oz (67.2 kg)  12/09/18 150 lb 3.2 oz (68.1 kg)     Physical Exam Vitals signs and nursing note reviewed.  Constitutional:      General: He is not in acute distress.    Appearance: He is well-developed.  Cardiovascular:     Rate and Rhythm: Normal rate and regular rhythm.  Pulmonary:     Effort: Pulmonary effort is normal.     Breath sounds: Normal breath sounds.  Skin:    General: Skin is warm and dry.  Neurological:     Mental Status: He is alert and oriented to person, place, and time.     Imaging: Dg Chest 2 View  Result Date: 03/03/2019 CLINICAL DATA:  Acute onset of shortness of breath and tachycardia. Productive cough. EXAM: CHEST - 2 VIEW COMPARISON:  None. FINDINGS: The lungs are well-aerated. Mild peribronchial thickening is noted. There is no evidence of focal opacification, pleural effusion or pneumothorax. The heart is normal in size; the mediastinal contour is within normal limits. No acute osseous abnormalities are seen. IMPRESSION: Mild peribronchial thickening noted. Lungs otherwise clear. Electronically Signed   By: Garald Balding M.D.   On: 03/03/2019 13:23   Dg Chest 2 View  Result Date: 02/28/2019 CLINICAL DATA:  73 year-old male c/o non-productive cough x 1 week. Pt denies fever. Pt went to his pulmonologist this week and was given abx, prednisone and had a chest xray on 02/24/2019 but isn't feeling relief. Hx of COPD EXAM: CHEST - 2 VIEW COMPARISON:  02/24/2019 FINDINGS: Midline trachea. Normal heart size.  Atherosclerosis in the transverse aorta. No pleural effusion or pneumothorax. Mild biapical pleural thickening. Diffuse peribronchial thickening. No lobar consolidation. IMPRESSION: 1. No acute cardiopulmonary disease. 2. Mild peribronchial thickening which may relate to chronic bronchitis or smoking. 3.  Aortic Atherosclerosis (ICD10-I70.0). Electronically Signed   By: [name] Miyamoto M.D.   On: 02/28/2019 11:04   Dg Chest 2 View  Result Date: 02/24/2019 CLINICAL DATA:  Cough for 2 weeks.  COPD. EXAM: CHEST - 2 VIEW COMPARISON:  12/07/2018 chest CT, 01/30/2017 chest radiograph and prior studies FINDINGS: The cardiomediastinal silhouette is unremarkable. COPD changes noted. There is no evidence of focal airspace disease, pulmonary edema, suspicious pulmonary nodule/mass, pleural effusion, or pneumothorax. No acute bony abnormalities are identified. IMPRESSION: COPD without evidence of acute cardiopulmonary disease. Electronically Signed   By: Margarette Canada M.D.   On: 02/24/2019 15:12     Assessment & Plan:   COPD (chronic obstructive pulmonary disease), gold stage C. Exacerbation is resolving.  Patient is doing much better today.  Advised him to complete prednisone as ordered in the ED and continue inhalers.  Patient Instructions  Continue Symbicort Continue Spiriva Continue Proventil as needed Please complete course of prednisone as directed from the ED Continue flutter valve device  Follow up: Follow up with Dr. Vaughan Browner as already scheduled       Fenton Foy, NP 03/04/2019

## 2019-03-04 NOTE — Addendum Note (Signed)
Addended by: Nena Polio on: 03/04/2019 03:33 PM   Modules accepted: Orders

## 2019-03-04 NOTE — Patient Instructions (Signed)
Continue Symbicort Continue Spiriva Continue Proventil as needed Please complete course of prednisone as directed from the ED Continue flutter valve device  Follow up: Follow up with Dr. Vaughan Browner as already scheduled

## 2019-03-04 NOTE — ED Provider Notes (Signed)
[facility] DEPART[facility] Provider Note   CSN: [id number] Arrival date & time: 03/03/19  1217    History   Chief Complaint Chief Complaint  Patient presents with   Tachycardia   Shortness of Breath    HPI Paul [name] is a 74 y.o. male.     Patient seen March 8 for very similar symptoms with is returning for today.  Complaint with shortness of breath for a week.  Patient is followed by pulmonology as well.  Patient uses oxygen at home at night.  But has not started using it during the daytime.  Patient comes back today with concerns for heart racing and shortness of breath seem to be worse at 9 this morning.  Also dyspnea on exertion.  Patient not currently on steroids just finished a course of them.  Patient has not been instructed to use oxygen at all times at home.  Patient has a albuterol nebulizer and an albuterol inhaler.  But he did not use any of those today.  Thought maybe that was the cause of the feeling of palpitations.     Past Medical History:  Diagnosis Date   COPD (chronic obstructive pulmonary disease) ([place])    High cholesterol     Patient Active Problem List   Diagnosis Date Noted   Lung nodule < 6cm on CT 05/29/2016   Former cigarette smoker 12/05/2015   Chronic respiratory failure with hypoxia ([place]) 01/27/2013   COPD (chronic obstructive pulmonary disease), gold stage C.    High cholesterol     Past Surgical History:  Procedure Laterality Date   CATARACT EXTRACTION Right    SKIN SURGERY     shoulders and chest        Home Medications    Prior to Admission medications   Medication Sig Start Date End Date Taking? Authorizing Provider  albuterol (PROVENTIL) (2.5 MG/3ML) 0.083% nebulizer solution Take 3 mLs (2.5 mg total) by nebulization every 6 (six) hours as needed for wheezing or shortness of breath. 02/28/19  Yes [name], PA-C  benzonatate (TESSALON) 100 MG capsule Take 1 capsule (100 mg total) by mouth  every 8 (eight) hours. 02/28/19  Yes [name], PA-C  budesonide-formoterol (SYMBICORT) 160-4.5 MCG/ACT inhaler Inhale 2 puffs into the lungs 2 (two) times daily. 12/09/18  Yes Mannam, Praveen, MD  doxycycline (VIBRA-TABS) 100 MG tablet Take 1 tablet (100 mg total) by mouth 2 (two) times daily. 02/24/19  Yes Martyn Ehrich, NP  fish oil-omega-3 fatty acids 1000 MG capsule Take 2 g by mouth daily.   Yes [provider]  loratadine (CLARITIN) 10 MG tablet Take 10 mg by mouth daily.   Yes [provider]  simvastatin (ZOCOR) 40 MG tablet Take 20 mg by mouth every evening.   Yes [provider]  Tiotropium Bromide Monohydrate (SPIRIVA RESPIMAT) 2.5 MCG/ACT AERS Inhale 2 puffs into the lungs daily.   Yes [provider]  budesonide-formoterol (SYMBICORT) 160-4.5 MCG/ACT inhaler Inhale 2 puffs into the lungs 2 (two) times daily. 03/04/19   Fenton Foy, NP  Multiple Vitamin (MULTIVITAMIN WITH MINERALS) TABS tablet Take 1 tablet by mouth daily.    [provider]  predniSONE (DELTASONE) 10 MG tablet Take 4 tablets (40 mg total) by mouth daily. 03/03/19   Fredia Sorrow, MD  Respiratory Therapy Supplies (FLUTTER) DEVI Use 2-3 times daily 07/04/15   Elsie Stain, MD  Tiotropium Bromide Monohydrate (SPIRIVA RESPIMAT) 2.5 MCG/ACT AERS Inhale 2 puffs into the  lungs daily. 03/04/19   Fenton Foy, NP    Family History Family History  Problem Relation Age of Onset   Heart disease Brother    Heart disease Mother    Heart disease Father    Cancer Paternal Uncle     Social History Social History   Tobacco Use   Smoking status: Former Smoker    Packs/day: 2.00    Years: 52.00    Pack years: 104.00    Types: Cigarettes    Last attempt to quit: 02/03/2009    Years since quitting: 10.0   Smokeless tobacco: Never Used   Tobacco comment: Counseled to remain smoke free  Substance Use Topics   Alcohol use: Yes    Alcohol/week: 0.0  standard drinks    Comment: occ   Drug use: No     Allergies   Patient has no known allergies.   Review of Systems Review of Systems  Constitutional: Negative for chills and fever.  HENT: Negative for congestion, rhinorrhea and sore throat.   Eyes: Negative for visual disturbance.  Respiratory: Positive for shortness of breath. Negative for cough.   Cardiovascular: Positive for palpitations. Negative for chest pain and leg swelling.  Gastrointestinal: Negative for abdominal pain, diarrhea, nausea and vomiting.  Genitourinary: Negative for dysuria.  Musculoskeletal: Negative for back pain and neck pain.  Skin: Negative for rash.  Neurological: Negative for dizziness, light-headedness and headaches.  Hematological: Does not bruise/bleed easily.  Psychiatric/Behavioral: Negative for confusion.     Physical Exam Updated Vital Signs BP (!) 117/99 (BP Location: Right Arm)    Pulse (!) 108    Temp 98.2 F (36.8 C) (Oral)    Resp 20    Ht 1.676 m (5\' 6" )    Wt 66.2 kg    SpO2 98%    BMI 23.57 kg/m   Physical Exam Vitals signs and nursing note reviewed.  Constitutional:      General: He is not in acute distress.    Appearance: Normal appearance. He is well-developed.  HENT:     Head: Normocephalic and atraumatic.     Mouth/Throat:     Mouth: Mucous membranes are moist.  Eyes:     Extraocular Movements: Extraocular movements intact.     Conjunctiva/sclera: Conjunctivae normal.     Pupils: Pupils are equal, round, and reactive to light.  Neck:     Musculoskeletal: Normal range of motion and neck supple.  Cardiovascular:     Rate and Rhythm: Regular rhythm. Tachycardia present.     Heart sounds: No murmur.  Pulmonary:     Effort: Pulmonary effort is normal. No respiratory distress.     Breath sounds: Normal breath sounds. No wheezing or rales.  Abdominal:     Palpations: Abdomen is soft.     Tenderness: There is no abdominal tenderness.  Musculoskeletal: Normal range of  motion.  Skin:    General: Skin is warm and dry.     Capillary Refill: Capillary refill takes less than 2 seconds.  Neurological:     General: No focal deficit present.     Mental Status: He is alert and oriented to person, place, and time.      ED Treatments / Results  Labs (all labs ordered are listed, but only abnormal results are displayed) Labs Reviewed  COMPREHENSIVE METABOLIC PANEL - Abnormal; Notable for the following components:      Result Value   Sodium 134 (*)    Chloride 97 (*)  Glucose, Bld 135 (*)    All other components within normal limits  CBC WITH DIFFERENTIAL/PLATELET - Abnormal; Notable for the following components:   WBC 12.5 (*)    MCH 25.0 (*)    MCHC 29.5 (*)    Neutro Abs 10.2 (*)    All other components within normal limits  URINALYSIS, ROUTINE W REFLEX MICROSCOPIC - Abnormal; Notable for the following components:   Specific Gravity, Urine <1.005 (*)    All other components within normal limits  CULTURE, BLOOD (ROUTINE X 2)  CULTURE, BLOOD (ROUTINE X 2)  LACTIC ACID, PLASMA  PROTIME-INR    EKG EKG Interpretation  Date/Time:  Wednesday March 03 2019 12:24:23 EDT Ventricular Rate:  108 PR Interval:  122 QRS Duration: 74 QT Interval:  306 QTC Calculation: 410 R Axis:   76 Text Interpretation:  Sinus tachycardia Right atrial enlargement Septal infarct , age undetermined Abnormal ECG No significant change since last tracing Confirmed by Fredia Sorrow [phone]) on 03/03/2019 12:44:57 PM   Radiology Dg Chest 2 View  Result Date: 03/03/2019 CLINICAL DATA:  Acute onset of shortness of breath and tachycardia. Productive cough. EXAM: CHEST - 2 VIEW COMPARISON:  None. FINDINGS: The lungs are well-aerated. Mild peribronchial thickening is noted. There is no evidence of focal opacification, pleural effusion or pneumothorax. The heart is normal in size; the mediastinal contour is within normal limits. No acute osseous abnormalities are seen.  IMPRESSION: Mild peribronchial thickening noted. Lungs otherwise clear. Electronically Signed   By: Garald Balding M.D.   On: 03/03/2019 13:23    Procedures Procedures (including critical care time)  Medications Ordered in ED Medications  predniSONE (DELTASONE) tablet 60 mg (60 mg Oral Given 03/03/19 1318)     Initial Impression / Assessment and Plan / ED Course  I have reviewed the triage vital signs and the nursing notes.  Pertinent labs & imaging results that were available during my care of the patient were reviewed by me and considered in my medical decision making (see chart for details).       Patient's work-up chest x-ray negative for signs of pneumonia or any acute findings.  Patient's heart rates a sinus tachycardia just slightly over 100.  No arrhythmias.  Patient treated with a albuterol Atrovent nebulizer he felt much better as far as breathing goes.  His oxygen saturations had been fine.  Pretty much on room air at rest he was like 98% or greater.  But patient states that when he walks he gets very short of breath.  As we discussed patient does have oxygen at home he uses it at night.  So recommended he start using it at daytime.  Follow-up with his pulmonologist.  Will give another course of steroids.  Informed him to use his albuterol every 6 hours.  Patient feeling much better at the time of discharge.  Clinically do not think this was secondary to pulmonary embolus.  Patient does have a little bit of a leukocytosis but is probably from being on the steroids.   Final Clinical Impressions(s) / ED Diagnoses   Final diagnoses:  COPD exacerbation [facility])    ED Discharge Orders         Ordered    predniSONE (DELTASONE) 10 MG tablet  Daily     03/03/19 1550           Fredia Sorrow, MD 03/04/19 1721

## 2019-03-08 LAB — CULTURE, BLOOD (ROUTINE X 2)
Culture: NO GROWTH
Culture: NO GROWTH
Special Requests: ADEQUATE

## 2019-03-10 ENCOUNTER — Emergency Department (HOSPITAL_BASED_OUTPATIENT_CLINIC_OR_DEPARTMENT_OTHER): Payer: Medicare Other

## 2019-03-10 ENCOUNTER — Telehealth: Payer: Self-pay | Admitting: Pulmonary Disease

## 2019-03-10 ENCOUNTER — Emergency Department (HOSPITAL_BASED_OUTPATIENT_CLINIC_OR_DEPARTMENT_OTHER)
Admission: EM | Admit: 2019-03-10 | Discharge: 2019-03-10 | Disposition: A | Discharge status: Home / Self Care | Payer: Medicare Other | Attending: Emergency Medicine | Admitting: Emergency Medicine

## 2019-03-10 ENCOUNTER — Other Ambulatory Visit: Payer: Self-pay

## 2019-03-10 ENCOUNTER — Encounter (HOSPITAL_BASED_OUTPATIENT_CLINIC_OR_DEPARTMENT_OTHER): Payer: Self-pay | Admitting: Emergency Medicine

## 2019-03-10 DIAGNOSIS — R05 Cough: Secondary | ICD-10-CM | POA: Diagnosis not present

## 2019-03-10 DIAGNOSIS — J441 Chronic obstructive pulmonary disease with (acute) exacerbation: Secondary | ICD-10-CM | POA: Insufficient documentation

## 2019-03-10 DIAGNOSIS — R0602 Shortness of breath: Secondary | ICD-10-CM | POA: Diagnosis not present

## 2019-03-10 DIAGNOSIS — Z79899 Other long term (current) drug therapy: Secondary | ICD-10-CM | POA: Insufficient documentation

## 2019-03-10 DIAGNOSIS — R000 Tachycardia, unspecified: Secondary | ICD-10-CM | POA: Diagnosis not present

## 2019-03-10 DIAGNOSIS — Z87891 Personal history of nicotine dependence: Secondary | ICD-10-CM | POA: Diagnosis not present

## 2019-03-10 LAB — CBC WITH DIFFERENTIAL/PLATELET
Abs Immature Granulocytes: 0.03 10*3/uL (ref 0.00–0.07)
Basophils Absolute: 0 10*3/uL (ref 0.0–0.1)
Basophils Relative: 0 %
EOS ABS: 0.5 10*3/uL (ref 0.0–0.5)
Eosinophils Relative: 4 %
HCT: 42.2 % (ref 39.0–52.0)
Hemoglobin: 12.9 g/dL — ABNORMAL LOW (ref 13.0–17.0)
Immature Granulocytes: 0 %
Lymphocytes Relative: 15 %
Lymphs Abs: 1.7 10*3/uL (ref 0.7–4.0)
MCH: 25.5 pg — ABNORMAL LOW (ref 26.0–34.0)
MCHC: 30.6 g/dL (ref 30.0–36.0)
MCV: 83.4 fL (ref 80.0–100.0)
Monocytes Absolute: 0.7 10*3/uL (ref 0.1–1.0)
Monocytes Relative: 6 %
Neutro Abs: 8.5 10*3/uL — ABNORMAL HIGH (ref 1.7–7.7)
Neutrophils Relative %: 75 %
PLATELETS: 358 10*3/uL (ref 150–400)
RBC: 5.06 MIL/uL (ref 4.22–5.81)
RDW: 14.4 % (ref 11.5–15.5)
WBC: 11.4 10*3/uL — ABNORMAL HIGH (ref 4.0–10.5)
nRBC: 0 % (ref 0.0–0.2)

## 2019-03-10 LAB — COMPREHENSIVE METABOLIC PANEL
ALT: 29 U/L (ref 0–44)
AST: 22 U/L (ref 15–41)
Albumin: 3.9 g/dL (ref 3.5–5.0)
Alkaline Phosphatase: 64 U/L (ref 38–126)
Anion gap: 8 (ref 5–15)
BUN: 12 mg/dL (ref 8–23)
CHLORIDE: 100 mmol/L (ref 98–111)
CO2: 28 mmol/L (ref 22–32)
Calcium: 8.5 mg/dL — ABNORMAL LOW (ref 8.9–10.3)
Creatinine, Ser: 0.68 mg/dL (ref 0.61–1.24)
GFR calc Af Amer: >60 mL/min (ref >60)
GFR calc non Af Amer: >60 mL/min (ref >60)
Glucose, Bld: 116 mg/dL — ABNORMAL HIGH (ref 70–99)
POTASSIUM: 3.9 mmol/L (ref 3.5–5.1)
Sodium: 136 mmol/L (ref 135–145)
Total Bilirubin: 0.6 mg/dL (ref 0.3–1.2)
Total Protein: 6.9 g/dL (ref 6.5–8.1)

## 2019-03-10 LAB — D-DIMER, QUANTITATIVE: D-Dimer, Quant: 2.79 ug/mL-FEU — ABNORMAL HIGH (ref 0.00–0.50)

## 2019-03-10 MED ORDER — PREDNISONE 10 MG PO TABS: ORAL_TABLET | ORAL | 0 refills | Status: DC

## 2019-03-10 MED ORDER — IOPAMIDOL (ISOVUE-370) INJECTION 76%
100.0000 mL | Freq: Once | INTRAVENOUS | Status: AC | PRN
  Administered 2019-03-10: 100 mL via INTRAVENOUS

## 2019-03-10 MED ORDER — METHYLPREDNISOLONE SODIUM SUCC 125 MG IJ SOLR
125.0000 mg | Freq: Once | INTRAMUSCULAR | Status: AC
  Administered 2019-03-10: 125 mg via INTRAVENOUS
  Filled 2019-03-10: qty 2

## 2019-03-10 MED ORDER — IPRATROPIUM-ALBUTEROL 0.5-2.5 (3) MG/3ML IN SOLN
RESPIRATORY_TRACT | Status: AC
  Administered 2019-03-10: 3 mL
  Filled 2019-03-10: qty 3

## 2019-03-10 MED ORDER — DOXYCYCLINE HYCLATE 100 MG PO CAPS: 100.0000 mg | ORAL_CAPSULE | Freq: Two times a day (BID) | ORAL | 0 refills | Status: DC

## 2019-03-10 MED ORDER — IPRATROPIUM-ALBUTEROL 0.5-2.5 (3) MG/3ML IN SOLN
3.0000 mL | Freq: Four times a day (QID) | RESPIRATORY_TRACT | Status: DC
  Administered 2019-03-10: 3 mL via RESPIRATORY_TRACT
  Filled 2019-03-10: qty 3

## 2019-03-10 MED ORDER — ALBUTEROL SULFATE (2.5 MG/3ML) 0.083% IN NEBU
INHALATION_SOLUTION | RESPIRATORY_TRACT | Status: AC
  Administered 2019-03-10: 2.5 mg
  Filled 2019-03-10: qty 3

## 2019-03-10 MED FILL — predniSONE 10 MG TABS: 10 | 11 days supply | Qty: 31 | Fill #0

## 2019-03-10 MED FILL — DOXYCYCLINE HYCLATE 100 MG: 100 | 10 days supply | Qty: 20 | Fill #0

## 2019-03-10 NOTE — ED Provider Notes (Signed)
[facility] EMERGENCY DEPARTMENT Provider Note   CSN: [id number] Arrival date & time: 03/10/19  1138    History   Chief Complaint Chief Complaint  Patient presents with   Cough    HPI Paul [name] is a 73 y.o. male.     HPI   Presents with shortness of breath, cough, has had hypoxia at home to 83% with coughing fits Feels like can't clear out sputum Lung doctor sent him here for IV steroids Seen 3/11, given steroids, improved, then symptoms came back after steroids completed No fevers, no body aches, no leg pain or swelling No congestion (other than after nebulizer) No sore throat.    Seen 3/8 Started on medication, saw lung doctor the next day and started on medication  Taken mucinex, breathing treatments every 4 hours and 2 rounds of prednisone, took doxycycline 2L at night    Past Medical History:  Diagnosis Date   COPD (chronic obstructive pulmonary disease) ([place])    High cholesterol     Patient Active Problem List   Diagnosis Date Noted   Lung nodule < 6cm on CT 05/29/2016   Former cigarette smoker 12/05/2015   Chronic respiratory failure with hypoxia ([place]) 01/27/2013   COPD (chronic obstructive pulmonary disease), gold stage C.    High cholesterol     Past Surgical History:  Procedure Laterality Date   CATARACT EXTRACTION Right    SKIN SURGERY     shoulders and chest        Home Medications    Prior to Admission medications   Medication Sig Start Date End Date Taking? Authorizing Provider  albuterol (PROVENTIL) (2.5 MG/3ML) 0.083% nebulizer solution Take 3 mLs (2.5 mg total) by nebulization every 6 (six) hours as needed for wheezing or shortness of breath. 02/28/19   [name], PA-C  benzonatate (TESSALON) 100 MG capsule Take 1 capsule (100 mg total) by mouth every 8 (eight) hours. 02/28/19   [name], PA-C  budesonide-formoterol (SYMBICORT) 160-4.5 MCG/ACT inhaler Inhale 2 puffs into the lungs 2 (two) times  daily. 12/09/18   Mannam, Hart Robinsons, MD  budesonide-formoterol (SYMBICORT) 160-4.5 MCG/ACT inhaler Inhale 2 puffs into the lungs 2 (two) times daily. 03/04/19   Fenton Foy, NP  doxycycline (VIBRAMYCIN) 100 MG capsule Take 1 capsule (100 mg total) by mouth 2 (two) times daily. 03/10/19   Gareth Morgan, MD  fish oil-omega-3 fatty acids 1000 MG capsule Take 2 g by mouth daily.    [provider]  loratadine (CLARITIN) 10 MG tablet Take 10 mg by mouth daily.    [provider]  Multiple Vitamin (MULTIVITAMIN WITH MINERALS) TABS tablet Take 1 tablet by mouth daily.    [provider]  predniSONE (DELTASONE) 10 MG tablet Take 4 tablets daily for 4 days, 3 tablets daily for 3 days, 2 tablets daily for 2 days, 1 tablet daily for 2 days. 03/10/19   Gareth Morgan, MD  Respiratory Therapy Supplies (FLUTTER) DEVI Use 2-3 times daily 07/04/15   Elsie Stain, MD  simvastatin (ZOCOR) 40 MG tablet Take 20 mg by mouth every evening.    [provider]  Tiotropium Bromide Monohydrate (SPIRIVA RESPIMAT) 2.5 MCG/ACT AERS Inhale 2 puffs into the lungs daily.    [provider]  Tiotropium Bromide Monohydrate (SPIRIVA RESPIMAT) 2.5 MCG/ACT AERS Inhale 2 puffs into the lungs daily. 03/04/19   Fenton Foy, NP    Family History Family History  Problem Relation Age of Onset  Heart disease Brother    Heart disease Mother    Heart disease Father    Cancer Paternal Uncle     Social History Social History   Tobacco Use   Smoking status: Former Smoker    Packs/day: 2.00    Years: 52.00    Pack years: 104.00    Types: Cigarettes    Last attempt to quit: 02/03/2009    Years since quitting: 10.1   Smokeless tobacco: Never Used   Tobacco comment: Counseled to remain smoke free  Substance Use Topics   Alcohol use: Yes    Alcohol/week: 0.0 standard drinks    Comment: occ   Drug use: No     Allergies   Patient has no known  allergies.   Review of Systems Review of Systems  Constitutional: Negative for chills and fever.  HENT: Negative for sore throat.   Eyes: Negative for visual disturbance.  Respiratory: Positive for cough, shortness of breath and wheezing.   Cardiovascular: Negative for chest pain and leg swelling.  Gastrointestinal: Negative for abdominal pain, nausea and vomiting.  Genitourinary: Negative for difficulty urinating and enuresis.  Musculoskeletal: Negative for back pain and neck stiffness.  Skin: Negative for rash.  Neurological: Negative for syncope and headaches.     Physical Exam Updated Vital Signs BP (!) 147/65    Pulse (!) 111    Temp 97.8 F (36.6 C) (Oral)    Resp (!) 21    Ht 5\' 6"  (1.676 m)    Wt 65.3 kg    SpO2 95%    BMI 23.24 kg/m   Physical Exam Vitals signs and nursing note reviewed.  Constitutional:      General: He is not in acute distress.    Appearance: He is well-developed. He is not diaphoretic.  HENT:     Head: Normocephalic and atraumatic.  Eyes:     Conjunctiva/sclera: Conjunctivae normal.  Neck:     Musculoskeletal: Normal range of motion.  Cardiovascular:     Rate and Rhythm: Normal rate and regular rhythm.     Heart sounds: Normal heart sounds. No murmur. No friction rub. No gallop.   Pulmonary:     Effort: Pulmonary effort is normal. No respiratory distress.     Breath sounds: Normal breath sounds. No wheezing or rales.  Abdominal:     General: There is no distension.     Palpations: Abdomen is soft.     Tenderness: There is no abdominal tenderness. There is no guarding.  Skin:    General: Skin is warm and dry.  Neurological:     Mental Status: He is alert and oriented to person, place, and time.      ED Treatments / Results  Labs (all labs ordered are listed, but only abnormal results are displayed) Labs Reviewed  CBC WITH DIFFERENTIAL/PLATELET - Abnormal; Notable for the following components:      Result Value   WBC 11.4 (*)     Hemoglobin 12.9 (*)    MCH 25.5 (*)    Neutro Abs 8.5 (*)    All other components within normal limits  COMPREHENSIVE METABOLIC PANEL - Abnormal; Notable for the following components:   Glucose, Bld 116 (*)    Calcium 8.5 (*)    All other components within normal limits  D-DIMER, QUANTITATIVE (NOT AT [facility]) - Abnormal; Notable for the following components:   D-Dimer, Quant 2.79 (*)    All other components within normal limits    EKG EKG Interpretation  Date/Time:  Wednesday March 10 2019 14:00:07 EDT Ventricular Rate:  102 PR Interval:    QRS Duration: 79 QT Interval:  327 QTC Calculation: 426 R Axis:   76 Text Interpretation:  Sinus tachycardia Right atrial enlargement Probable anteroseptal infarct, old No significant change since last tracing Confirmed by Gareth Morgan [phone]) on 03/10/2019 3:05:19 PM   Radiology Dg Chest 2 View  Result Date: 03/10/2019 CLINICAL DATA:  Cough and shortness of breath EXAM: CHEST - 2 VIEW COMPARISON:  January 02, 2019 FINDINGS: There is no edema or consolidation. Heart size and pulmonary vascularity are normal. No adenopathy. There is aortic atherosclerosis. There is mild degenerative change in the thoracic spine. IMPRESSION: No edema or consolidation. Stable cardiac silhouette. Aortic Atherosclerosis (ICD10-I70.0). Electronically Signed   By: Lowella Grip III M.D.   On: 03/10/2019 13:02   Ct Angio Chest Pe W And/or Wo Contrast  Result Date: 03/10/2019 CLINICAL DATA:  Shortness of breath and hypoxia. EXAM: CT ANGIOGRAPHY CHEST WITH CONTRAST TECHNIQUE: Multidetector CT imaging of the chest was performed using the standard protocol during bolus administration of intravenous contrast. Multiplanar CT image reconstructions and MIPs were obtained to evaluate the vascular anatomy. CONTRAST:  167mL ISOVUE-370 IOPAMIDOL (ISOVUE-370) INJECTION 76% COMPARISON:  Chest radiographs obtained earlier today. Chest CT dated 12/07/2018. Chest CT dated 12/05/2017.  FINDINGS: Cardiovascular: Atheromatous calcifications, including the coronary arteries and aorta. Normally opacified pulmonary arteries with no pulmonary arterial filling defects seen. Normal sized heart. Mediastinum/Nodes: No enlarged mediastinal, hilar, or axillary lymph nodes. Thyroid gland, trachea, and esophagus demonstrate no significant findings. Lungs/Pleura: Biapical calcified pleural plaques. Mild bilateral bullous changes and peribronchial thickening. Stable small left apical nodule measuring 4 mm in maximum diameter on image number 11 series 5. Stable posterior right upper lobe nodule measuring 5 mm in maximum diameter on image number 34 series 5. Stable small curvilinear area of nodularity in the right lower lobe inferiorly on image number 89 series 5 since 12/07/2018, with a significantly smaller soft tissue component compared to 12/05/2017. Stable bilateral calcified granulomata. Upper Abdomen: Mild diffuse low density of the liver. Musculoskeletal: Thoracic spine degenerative changes and mild upper thoracic vertebral compression deformities without significant change. Review of the MIP images confirms the above findings. IMPRESSION: 1. No pulmonary emboli or acute abnormality. 2. Stable small bilateral lung nodules. Recommend a follow-up low dose screening chest CT in 1 year. 3. Mild changes of COPD and chronic bronchitis. 4. Mild diffuse hepatic steatosis. 5.  Calcific coronary artery and aortic atherosclerosis. 6. 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 Aortic Atherosclerosis (ICD10-I70.0) and Emphysema (ICD10-J43.9). Electronically Signed   By: Claudie Revering M.D.   On: 03/10/2019 15:19    Procedures Procedures (including critical care time)  Medications Ordered in ED Medications  albuterol (PROVENTIL) (2.5 MG/3ML) 0.083% nebulizer solution (2.5 mg  Given 03/10/19 1212)  ipratropium-albuterol (DUONEB) 0.5-2.5 (3) MG/3ML nebulizer solution (3 mLs  Given  03/10/19 1212)  methylPREDNISolone sodium succinate (SOLU-MEDROL) 125 mg/2 mL injection 125 mg (125 mg Intravenous Given 03/10/19 1317)  iopamidol (ISOVUE-370) 76 % injection 100 mL (100 mLs Intravenous Contrast Given 03/10/19 1456)     Initial Impression / Assessment and Plan / ED Course  I have reviewed the triage vital signs and the nursing notes.  Pertinent labs & imaging results that were available during my care of the patient were reviewed by me and considered in my medical decision making (see chart for details).        73yo male former  smoker with history of severe COPD, chronic respiratory failure on 2L O2 [place] at night, hyperlipidemia, lung nodule, multiple recent visits for COPD exacerbation presents with concern for shortness of breath and cough.  DDx includes pneumonia, COPD exacerbation, PE, CHF, pneumothorax.  No fever, no known COVID contacts.   EKG with sinus tachycardia. CXR without acute abnormalities.  DDimer elevated. CT PE study completed shows no evidence of PE, pneumonia or edema. No chest pain and or pressure, and with cough and relief with nebs, doubt ACS.  History and exam most consistent with COPD exacerbation.  O2 saturations in the ED in mid-90s on room air, and he is able to ambulate at good pace without significant tachypnea and feel he is appropriate for outpatient management.  Given solumedrol, nebs in ED. Given rx for prednisone taper, doxycycline. Patient discharged in stable condition with understanding of reasons to return.   Final Clinical Impressions(s) / ED Diagnoses   Final diagnoses:  COPD exacerbation [facility])    ED Discharge Orders         Ordered    predniSONE (DELTASONE) 10 MG tablet     03/10/19 1607    doxycycline (VIBRAMYCIN) 100 MG capsule  2 times daily     03/10/19 1607           Gareth Morgan, MD 03/10/19 2133

## 2019-03-10 NOTE — Telephone Encounter (Signed)
I called patient. He was very short of breath and sats are dropping into 80's with O2. Advised patient to go to the ED to be evaluated. Wife agreed and is taking him to [facility] ED.

## 2019-03-10 NOTE — ED Triage Notes (Signed)
Pt sent to ED by pulmonologist for "Steroids".  Sts he can't keep his O2 level up and has been seen here for same sx 3 times recently.  Sts he is trying to cough up the mucous but is not able.

## 2019-03-10 NOTE — Telephone Encounter (Signed)
Primary Pulmonologist: Mannam Last office visit and with whom: 03/04/2019 with Lazaro Arms, NP What do we see them for (pulmonary problems): COPD Last OV assessment/plan:   Instructions   Return if symptoms worsen or fail to improve.  Continue Symbicort Continue Spiriva Continue Proventil as needed Please complete course of prednisone as directed from the ED Continue flutter valve device  Follow up: Follow up with Dr. Vaughan Browner as already scheduled       Was appointment offered to patient (explain)?  Wife and pt want recommendations to what they need to do   Reason for call: called and spoke with pt's wife [name] who states pt's SOB has become worse since last OV. Pt's O2 sats dropped to 81% on his 2L O2 this morning after walking to the bathroom and then getting dressed. Per [name], any little activity has bottomed pt's O2 sats. Pt is also coughing but states cough is not productive due to not being able to get any mucus up as it is so thick.  Pt denies any fever or any complaints of chest pain/chest tightness.  Per [name], pt finished the prednisone Rx 2 days ago after he was prescribed it after recent ER visit. Pt also has finished abx that was prescribed by our office.   Pt has had no recent travel and also has not been around anyone that has been sick.  Tammy, please advise recommendations for pt and and wife [name]. Thanks!

## 2019-03-10 NOTE — ED Notes (Signed)
Patient transported to CT 

## 2019-03-10 NOTE — Telephone Encounter (Signed)
Noted.  Will close encounter.  

## 2019-03-16 ENCOUNTER — Telehealth: Payer: Self-pay | Admitting: Pulmonary Disease

## 2019-03-16 ENCOUNTER — Telehealth: Payer: Self-pay | Admitting: Nurse Practitioner

## 2019-03-16 MED ORDER — PREDNISONE 10 MG PO TABS: ORAL_TABLET | ORAL | 0 refills | Status: DC

## 2019-03-16 NOTE — Telephone Encounter (Signed)
Call in prednisone taper starting at 60 mg. Reduce dose by 10 mg every 3 days

## 2019-03-16 NOTE — Telephone Encounter (Signed)
Called and spoke with patients wife, Dr. Vaughan Browner placed patient on larger dose of steroids taking 6 at a time. Patient already had 3 today. He wanted to know if he needed to take the full 6 today or just take 3. Patient was on other prednisone prescription and dwindled down to 3.    Dr. Vaughan Browner please advise, thank you.

## 2019-03-16 NOTE — Telephone Encounter (Signed)
Take 3 extra today

## 2019-03-16 NOTE — Telephone Encounter (Signed)
Spoke with pt's wife, [name]. She is aware of Dr. Matilde Bash recommendations. Rx has been sent in. Nothing further was needed.

## 2019-03-16 NOTE — Telephone Encounter (Signed)
Spoke with wife (DPR) and gave her the recommendation of PM to take 3 extra today.  Nothing further is needed.

## 2019-03-16 NOTE — Telephone Encounter (Signed)
Primary Pulmonologist: Dr. Vaughan Browner Last office visit and with whom: 03/04/2019 with Paul [name] What do we see them for (pulmonary problems): COPD Last OV assessment/plan:  Continue Symbicort Continue Spiriva Continue Proventil as needed Please complete course of prednisone as directed from the ED Continue flutter valve device  Follow up: Follow up with Dr. Vaughan Browner as already scheduled  Was appointment offered to patient (explain)?  No, due to COVID-19 restrictions.  Reason for call:  Spoke with pt's wife, Paul [name]. States that the pt is not feeling well. Reports increased shortness of breath and coughing. Cough is producing white mucus at times. Denies chest tightness, wheezing, fever, recent travel or sick contacts. Symptoms have been present for almost 3 weeks. Pt has been to the ER 3 times. Has been taking Doxycycline 100mg  BID, prednisone taper, albuterol neb q 4hrs and Mucinex BID. Pt has been using his qhs oxygen during the day due to his increased shortness of breath. Wife states that even with 2L, if he exerts himself his oxygen level falls to 85% on 2L. They would like recommendations on what to do, they would rather not come in for an appointment.  TP - please advise. Thanks!

## 2019-03-19 ENCOUNTER — Emergency Department (HOSPITAL_BASED_OUTPATIENT_CLINIC_OR_DEPARTMENT_OTHER): Payer: Medicare Other

## 2019-03-19 ENCOUNTER — Inpatient Hospital Stay (HOSPITAL_BASED_OUTPATIENT_CLINIC_OR_DEPARTMENT_OTHER)
Admission: EM | Admit: 2019-03-19 | Discharge: 2019-03-25 | DRG: 190 | Disposition: A | Discharge status: Home / Self Care | Payer: Medicare Other | Source: Ambulatory Visit | Attending: Internal Medicine | Admitting: Internal Medicine

## 2019-03-19 ENCOUNTER — Telehealth: Payer: Self-pay | Admitting: Pulmonary Disease

## 2019-03-19 ENCOUNTER — Other Ambulatory Visit: Payer: Self-pay

## 2019-03-19 ENCOUNTER — Encounter (HOSPITAL_BASED_OUTPATIENT_CLINIC_OR_DEPARTMENT_OTHER): Payer: Self-pay | Admitting: Adult Health

## 2019-03-19 DIAGNOSIS — Z8249 Family history of ischemic heart disease and other diseases of the circulatory system: Secondary | ICD-10-CM

## 2019-03-19 DIAGNOSIS — Z7951 Long term (current) use of inhaled steroids: Secondary | ICD-10-CM

## 2019-03-19 DIAGNOSIS — I4819 Other persistent atrial fibrillation: Secondary | ICD-10-CM | POA: Diagnosis not present

## 2019-03-19 DIAGNOSIS — E873 Alkalosis: Secondary | ICD-10-CM | POA: Diagnosis present

## 2019-03-19 DIAGNOSIS — I700 Atherosclerosis of aorta: Secondary | ICD-10-CM | POA: Diagnosis present

## 2019-03-19 DIAGNOSIS — I2510 Atherosclerotic heart disease of native coronary artery without angina pectoris: Secondary | ICD-10-CM | POA: Diagnosis present

## 2019-03-19 DIAGNOSIS — R000 Tachycardia, unspecified: Secondary | ICD-10-CM

## 2019-03-19 DIAGNOSIS — J449 Chronic obstructive pulmonary disease, unspecified: Secondary | ICD-10-CM | POA: Diagnosis not present

## 2019-03-19 DIAGNOSIS — E7800 Pure hypercholesterolemia, unspecified: Secondary | ICD-10-CM | POA: Diagnosis present

## 2019-03-19 DIAGNOSIS — Z79899 Other long term (current) drug therapy: Secondary | ICD-10-CM | POA: Diagnosis not present

## 2019-03-19 DIAGNOSIS — Z20828 Contact with and (suspected) exposure to other viral communicable diseases: Secondary | ICD-10-CM | POA: Diagnosis not present

## 2019-03-19 DIAGNOSIS — R0602 Shortness of breath: Secondary | ICD-10-CM | POA: Diagnosis not present

## 2019-03-19 DIAGNOSIS — E785 Hyperlipidemia, unspecified: Secondary | ICD-10-CM | POA: Diagnosis present

## 2019-03-19 DIAGNOSIS — Z9981 Dependence on supplemental oxygen: Secondary | ICD-10-CM

## 2019-03-19 DIAGNOSIS — I4891 Unspecified atrial fibrillation: Secondary | ICD-10-CM | POA: Diagnosis not present

## 2019-03-19 DIAGNOSIS — J439 Emphysema, unspecified: Principal | ICD-10-CM | POA: Diagnosis present

## 2019-03-19 DIAGNOSIS — J9611 Chronic respiratory failure with hypoxia: Secondary | ICD-10-CM | POA: Diagnosis not present

## 2019-03-19 DIAGNOSIS — R030 Elevated blood-pressure reading, without diagnosis of hypertension: Secondary | ICD-10-CM | POA: Diagnosis present

## 2019-03-19 DIAGNOSIS — I2584 Coronary atherosclerosis due to calcified coronary lesion: Secondary | ICD-10-CM | POA: Diagnosis not present

## 2019-03-19 DIAGNOSIS — Z87891 Personal history of nicotine dependence: Secondary | ICD-10-CM

## 2019-03-19 DIAGNOSIS — Z03818 Encounter for observation for suspected exposure to other biological agents ruled out: Secondary | ICD-10-CM | POA: Diagnosis not present

## 2019-03-19 DIAGNOSIS — J441 Chronic obstructive pulmonary disease with (acute) exacerbation: Secondary | ICD-10-CM | POA: Diagnosis present

## 2019-03-19 DIAGNOSIS — Z9841 Cataract extraction status, right eye: Secondary | ICD-10-CM

## 2019-03-19 DIAGNOSIS — Z7952 Long term (current) use of systemic steroids: Secondary | ICD-10-CM | POA: Diagnosis not present

## 2019-03-19 DIAGNOSIS — I480 Paroxysmal atrial fibrillation: Secondary | ICD-10-CM | POA: Diagnosis not present

## 2019-03-19 DIAGNOSIS — J9621 Acute and chronic respiratory failure with hypoxia: Secondary | ICD-10-CM | POA: Diagnosis present

## 2019-03-19 LAB — CBC WITH DIFFERENTIAL/PLATELET
Abs Immature Granulocytes: 0.03 10*3/uL (ref 0.00–0.07)
BASOS ABS: 0 10*3/uL (ref 0.0–0.1)
Basophils Relative: 0 %
Eosinophils Absolute: 0.1 10*3/uL (ref 0.0–0.5)
Eosinophils Relative: 0 %
HCT: 43 % (ref 39.0–52.0)
Hemoglobin: 13.2 g/dL (ref 13.0–17.0)
Immature Granulocytes: 0 %
Lymphocytes Relative: 3 %
Lymphs Abs: 0.4 10*3/uL — ABNORMAL LOW (ref 0.7–4.0)
MCH: 25.2 pg — ABNORMAL LOW (ref 26.0–34.0)
MCHC: 30.7 g/dL (ref 30.0–36.0)
MCV: 82.2 fL (ref 80.0–100.0)
Monocytes Absolute: 0.2 10*3/uL (ref 0.1–1.0)
Monocytes Relative: 1 %
Neutro Abs: 12.1 10*3/uL — ABNORMAL HIGH (ref 1.7–7.7)
Neutrophils Relative %: 96 %
Platelets: 349 10*3/uL (ref 150–400)
RBC: 5.23 MIL/uL (ref 4.22–5.81)
RDW: 14.2 % (ref 11.5–15.5)
WBC: 12.7 10*3/uL — ABNORMAL HIGH (ref 4.0–10.5)
nRBC: 0 % (ref 0.0–0.2)

## 2019-03-19 LAB — HEPATIC FUNCTION PANEL
ALT: 46 U/L — ABNORMAL HIGH (ref 0–44)
AST: 29 U/L (ref 15–41)
Albumin: 3.6 g/dL (ref 3.5–5.0)
Alkaline Phosphatase: 54 U/L (ref 38–126)
Bilirubin, Direct: <0.1 mg/dL (ref 0.0–0.2)
Total Bilirubin: 0.5 mg/dL (ref 0.3–1.2)
Total Protein: 6.5 g/dL (ref 6.5–8.1)

## 2019-03-19 LAB — POCT I-STAT 7, (LYTES, BLD GAS, ICA,H+H)
Acid-Base Excess: 9 mmol/L — ABNORMAL HIGH (ref 0.0–2.0)
BICARBONATE: 32 mmol/L — AB (ref 20.0–28.0)
CALCIUM ION: 1.14 mmol/L — AB (ref 1.15–1.40)
HCT: 36 % — ABNORMAL LOW (ref 39.0–52.0)
Hemoglobin: 12.2 g/dL — ABNORMAL LOW (ref 13.0–17.0)
O2 SAT: 98 %
Patient temperature: 98
Potassium: 4.2 mmol/L (ref 3.5–5.1)
Sodium: 131 mmol/L — ABNORMAL LOW (ref 135–145)
TCO2: 33 mmol/L — AB (ref 22–32)
pCO2 arterial: 37.9 mmHg (ref 32.0–48.0)
pH, Arterial: 7.533 — ABNORMAL HIGH (ref 7.350–7.450)
pO2, Arterial: 91 mmHg (ref 83.0–108.0)

## 2019-03-19 LAB — BASIC METABOLIC PANEL
ANION GAP: 9 (ref 5–15)
BUN: 9 mg/dL (ref 8–23)
CO2: 30 mmol/L (ref 22–32)
Calcium: 9.1 mg/dL (ref 8.9–10.3)
Chloride: 95 mmol/L — ABNORMAL LOW (ref 98–111)
Creatinine, Ser: 0.62 mg/dL (ref 0.61–1.24)
GFR calc non Af Amer: >60 mL/min (ref >60)
Glucose, Bld: 124 mg/dL — ABNORMAL HIGH (ref 70–99)
Potassium: 4.1 mmol/L (ref 3.5–5.1)
SODIUM: 134 mmol/L — AB (ref 135–145)

## 2019-03-19 LAB — CREATININE, SERUM
Creatinine, Ser: 0.75 mg/dL (ref 0.61–1.24)
GFR calc Af Amer: >60 mL/min (ref >60)
GFR calc non Af Amer: >60 mL/min (ref >60)

## 2019-03-19 LAB — TROPONIN I

## 2019-03-19 LAB — POCT I-STAT EG7
Acid-Base Excess: 9 mmol/L — ABNORMAL HIGH (ref 0.0–2.0)
Bicarbonate: 34.7 mmol/L — ABNORMAL HIGH (ref 20.0–28.0)
Calcium, Ion: 1.1 mmol/L — ABNORMAL LOW (ref 1.15–1.40)
HCT: 42 % (ref 39.0–52.0)
Hemoglobin: 14.3 g/dL (ref 13.0–17.0)
O2 Saturation: 80 %
PH VEN: 7.443 — AB (ref 7.250–7.430)
Patient temperature: 98
Potassium: 4 mmol/L (ref 3.5–5.1)
Sodium: 134 mmol/L — ABNORMAL LOW (ref 135–145)
TCO2: 36 mmol/L — ABNORMAL HIGH (ref 22–32)
pCO2, Ven: 50.5 mmHg (ref 44.0–60.0)
pO2, Ven: 42 mmHg (ref 32.0–45.0)

## 2019-03-19 LAB — C-REACTIVE PROTEIN: CRP: <0.8 mg/dL (ref <1.0)

## 2019-03-19 LAB — RESPIRATORY PANEL BY PCR
Adenovirus: NOT DETECTED
Bordetella pertussis: NOT DETECTED
CHLAMYDOPHILA PNEUMONIAE-RVPPCR: NOT DETECTED
CORONAVIRUS NL63-RVPPCR: NOT DETECTED
Coronavirus 229E: NOT DETECTED
Coronavirus HKU1: NOT DETECTED
Coronavirus OC43: NOT DETECTED
INFLUENZA A-RVPPCR: NOT DETECTED
Influenza B: NOT DETECTED
Metapneumovirus: NOT DETECTED
Mycoplasma pneumoniae: NOT DETECTED
PARAINFLUENZA VIRUS 3-RVPPCR: NOT DETECTED
PARAINFLUENZA VIRUS 4-RVPPCR: NOT DETECTED
Parainfluenza Virus 1: NOT DETECTED
Parainfluenza Virus 2: NOT DETECTED
Respiratory Syncytial Virus: NOT DETECTED
Rhinovirus / Enterovirus: NOT DETECTED

## 2019-03-19 LAB — CBC
HEMATOCRIT: 38.5 % — AB (ref 39.0–52.0)
Hemoglobin: 12.3 g/dL — ABNORMAL LOW (ref 13.0–17.0)
MCH: 26 pg (ref 26.0–34.0)
MCHC: 31.9 g/dL (ref 30.0–36.0)
MCV: 81.4 fL (ref 80.0–100.0)
Platelets: 336 10*3/uL (ref 150–400)
RBC: 4.73 MIL/uL (ref 4.22–5.81)
RDW: 14.2 % (ref 11.5–15.5)
WBC: 10.3 10*3/uL (ref 4.0–10.5)
nRBC: 0 % (ref 0.0–0.2)

## 2019-03-19 LAB — SEDIMENTATION RATE: Sed Rate: 10 mm/hr (ref 0–16)

## 2019-03-19 LAB — PROCALCITONIN: Procalcitonin: <0.1 ng/mL

## 2019-03-19 LAB — BRAIN NATRIURETIC PEPTIDE: B Natriuretic Peptide: 38.2 pg/mL (ref 0.0–100.0)

## 2019-03-19 LAB — LACTATE DEHYDROGENASE: LDH: 240 U/L — ABNORMAL HIGH (ref 98–192)

## 2019-03-19 LAB — FERRITIN: Ferritin: 277 ng/mL (ref 24–336)

## 2019-03-19 MED ORDER — AZITHROMYCIN 250 MG PO TABS: 250.0000 mg | ORAL_TABLET | Freq: Every day | ORAL | Status: DC

## 2019-03-19 MED ORDER — PREDNISONE 20 MG PO TABS: 20.0000 mg | ORAL_TABLET | Freq: Two times a day (BID) | ORAL | Status: DC

## 2019-03-19 MED ORDER — ENOXAPARIN SODIUM 40 MG/0.4ML ~~LOC~~ SOLN
40.0000 mg | SUBCUTANEOUS | Status: DC
  Administered 2019-03-19: 40 mg via SUBCUTANEOUS
  Filled 2019-03-19: qty 0.4

## 2019-03-19 MED ORDER — ACETAMINOPHEN 325 MG PO TABS: 650.0000 mg | ORAL_TABLET | Freq: Four times a day (QID) | ORAL | Status: DC | PRN

## 2019-03-19 MED ORDER — AZITHROMYCIN 250 MG PO TABS
500.0000 mg | ORAL_TABLET | Freq: Every day | ORAL | Status: AC
  Administered 2019-03-19: 500 mg via ORAL
  Filled 2019-03-19: qty 2

## 2019-03-19 MED ORDER — HYDRALAZINE HCL 20 MG/ML IJ SOLN
10.0000 mg | Freq: Four times a day (QID) | INTRAMUSCULAR | Status: DC | PRN
  Administered 2019-03-19: 10 mg via INTRAVENOUS
  Filled 2019-03-19: qty 1

## 2019-03-19 MED ORDER — ACETAMINOPHEN 650 MG RE SUPP: 650.0000 mg | Freq: Four times a day (QID) | RECTAL | Status: DC | PRN

## 2019-03-19 MED ORDER — OMEGA-3-ACID ETHYL ESTERS 1 G PO CAPS
1.0000 g | ORAL_CAPSULE | Freq: Every day | ORAL | Status: DC
  Administered 2019-03-20 – 2019-03-24 (×5): 1 g via ORAL
  Filled 2019-03-19 (×5): qty 1

## 2019-03-19 MED ORDER — MOMETASONE FURO-FORMOTEROL FUM 200-5 MCG/ACT IN AERO: 2.0000 | INHALATION_SPRAY | Freq: Two times a day (BID) | RESPIRATORY_TRACT | Status: DC

## 2019-03-19 MED ORDER — SODIUM CHLORIDE 0.9 % IV SOLN
1.0000 g | INTRAVENOUS | Status: DC
  Administered 2019-03-19: 1 g via INTRAVENOUS
  Filled 2019-03-19 (×2): qty 10

## 2019-03-19 MED ORDER — ASPIRIN EC 81 MG PO TBEC
81.0000 mg | DELAYED_RELEASE_TABLET | Freq: Every day | ORAL | Status: DC
  Administered 2019-03-19: 81 mg via ORAL
  Filled 2019-03-19: qty 1

## 2019-03-19 MED ORDER — OMEGA-3 FATTY ACIDS 1000 MG PO CAPS: 1.0000 g | ORAL_CAPSULE | Freq: Every day | ORAL | Status: DC

## 2019-03-19 MED ORDER — IPRATROPIUM-ALBUTEROL 0.5-2.5 (3) MG/3ML IN SOLN
3.0000 mL | RESPIRATORY_TRACT | Status: AC
  Administered 2019-03-19 (×3): 3 mL via RESPIRATORY_TRACT
  Filled 2019-03-19: qty 9

## 2019-03-19 MED ORDER — MAGNESIUM SULFATE 2 GM/50ML IV SOLN
2.0000 g | Freq: Once | INTRAVENOUS | Status: AC
  Administered 2019-03-19: 2 g via INTRAVENOUS
  Filled 2019-03-19: qty 50

## 2019-03-19 MED ORDER — LORATADINE 10 MG PO TABS
10.0000 mg | ORAL_TABLET | Freq: Every day | ORAL | Status: DC
  Administered 2019-03-20 – 2019-03-25 (×6): 10 mg via ORAL
  Filled 2019-03-19 (×6): qty 1

## 2019-03-19 MED ORDER — ONDANSETRON HCL 4 MG PO TABS: 4.0000 mg | ORAL_TABLET | Freq: Four times a day (QID) | ORAL | Status: DC | PRN

## 2019-03-19 MED ORDER — ALBUTEROL SULFATE HFA 108 (90 BASE) MCG/ACT IN AERS
2.0000 | INHALATION_SPRAY | RESPIRATORY_TRACT | Status: DC | PRN
  Administered 2019-03-21 – 2019-03-23 (×3): 2 via RESPIRATORY_TRACT
  Filled 2019-03-19: qty 6.7

## 2019-03-19 MED ORDER — PREDNISONE 10 MG PO TABS
10.0000 mg | ORAL_TABLET | Freq: Once | ORAL | Status: AC
  Administered 2019-03-19: 10 mg via ORAL
  Filled 2019-03-19: qty 1

## 2019-03-19 MED ORDER — IPRATROPIUM-ALBUTEROL 20-100 MCG/ACT IN AERS
2.0000 | INHALATION_SPRAY | Freq: Four times a day (QID) | RESPIRATORY_TRACT | Status: DC
  Administered 2019-03-19 – 2019-03-20 (×4): 2 via RESPIRATORY_TRACT
  Filled 2019-03-19: qty 4

## 2019-03-19 MED ORDER — ONDANSETRON HCL 4 MG/2ML IJ SOLN: 4.0000 mg | Freq: Four times a day (QID) | INTRAMUSCULAR | Status: DC | PRN

## 2019-03-19 MED ORDER — SIMVASTATIN 20 MG PO TABS
20.0000 mg | ORAL_TABLET | Freq: Every day | ORAL | Status: DC
  Administered 2019-03-19: 20 mg via ORAL
  Filled 2019-03-19: qty 1

## 2019-03-19 NOTE — ED Notes (Signed)
ED TO INPATIENT HANDOFF REPORT  ED Nurse Name and Phone #:  Lenice Pressman  [phone]  S Name/Age/Gender Paul [name] 73 y.o. male Room/Bed: 017C/017C  Code Status   Code Status: Full Code  Home/SNF/Other Home Patient oriented to: self, place, time and situation Is this baseline? Yes   Triage Complete: Triage complete  Chief Complaint COPD exacerbation ([place]) [J44.1]  Triage Note Hx of COPD, on prednisone and nebs, he reports increased respiratory effort and decrease O2 sats on 2 liters oxygen.    Allergies No Known Allergies  Level of Care/Admitting Diagnosis ED Disposition    ED Disposition Condition [facility]: [place] [[id number]]  Level of Care: Telemetry Medical [104]  Diagnosis: COPD exacerbation [facility]) [[id number]]  Admitting Physician: Guilford Shi [[id number]]  Attending Physician: Guilford Shi [[id number]]  Estimated length of stay: 3 - 4 days  Certification:: I certify this patient will need inpatient services for at least 2 midnights  Bed request comments: LOW RISK COVID RULE OUT ISOLATION  PT Class (Do Not Modify): Inpatient [101]  PT Acc Code (Do Not Modify): Private [1]       B Medical/Surgery History Past Medical History:  Diagnosis Date  . COPD (chronic obstructive pulmonary disease) ([place])   . High cholesterol    Past Surgical History:  Procedure Laterality Date  . CATARACT EXTRACTION Right   . SKIN SURGERY     shoulders and chest     A IV Location/Drains/Wounds Patient Lines/Drains/Airways Status   Active Line/Drains/Airways    Name:   Placement date:   Placement time:   Site:   Days:   Peripheral IV 03/19/19 Right Forearm   03/19/19    1124    Forearm   less than 1          Intake/Output Last 24 hours  Intake/Output Summary (Last 24 hours) at 03/19/2019 2029 Last data filed at 03/19/2019 1300 Gross per 24 hour  Intake 43.5 ml  Output -  Net 43.5 ml    Labs/Imaging Results for orders placed  or performed during the hospital encounter of 03/19/19 (from the past 48 hour(s))  Troponin I - ONCE - STAT     Status: None   Collection Time: 03/19/19 10:47 AM  Result Value Ref Range   Troponin I <0.03 <0.03 ng/mL    Comment: Performed at [facility], [address] [place], [place] [address]  CBC with Differential     Status: Abnormal   Collection Time: 03/19/19 10:56 AM  Result Value Ref Range   WBC 12.7 (H) 4.0 - 10.5 K/uL   RBC 5.23 4.22 - 5.81 MIL/uL   Hemoglobin 13.2 13.0 - 17.0 g/dL   HCT 43.0 39.0 - 52.0 %   MCV 82.2 80.0 - 100.0 fL   MCH 25.2 (L) 26.0 - 34.0 pg   MCHC 30.7 30.0 - 36.0 g/dL   RDW 14.2 11.5 - 15.5 %   Platelets 349 150 - 400 K/uL   nRBC 0.0 0.0 - 0.2 %   Neutrophils Relative % 96 %   Neutro Abs 12.1 (H) 1.7 - 7.7 K/uL   Lymphocytes Relative 3 %   Lymphs Abs 0.4 (L) 0.7 - 4.0 K/uL   Monocytes Relative 1 %   Monocytes Absolute 0.2 0.1 - 1.0 K/uL   Eosinophils Relative 0 %   Eosinophils Absolute 0.1 0.0 - 0.5 K/uL   Basophils Relative 0 %   Basophils Absolute 0.0 0.0 -  0.1 K/uL   Immature Granulocytes 0 %   Abs Immature Granulocytes 0.03 0.00 - 0.07 K/uL    Comment: Performed at [facility], [address] [place], [place] [address]  Basic metabolic panel     Status: Abnormal   Collection Time: 03/19/19 10:56 AM  Result Value Ref Range   Sodium 134 (L) 135 - 145 mmol/L   Potassium 4.1 3.5 - 5.1 mmol/L   Chloride 95 (L) 98 - 111 mmol/L   CO2 30 22 - 32 mmol/L   Glucose, Bld 124 (H) 70 - 99 mg/dL   BUN 9 8 - 23 mg/dL   Creatinine, Ser 0.62 0.61 - 1.24 mg/dL   Calcium 9.1 8.9 - 10.3 mg/dL   GFR calc non Af Amer >60 >60 mL/min   GFR calc Af Amer >60 >60 mL/min   Anion gap 9 5 - 15    Comment: Performed at [facility], [address] [place], [place] [address]  Brain natriuretic peptide     Status: None   Collection Time: 03/19/19 10:56 AM  Result Value Ref Range   B Natriuretic Peptide 38.2 0.0 - 100.0 pg/mL     Comment: Performed at [facility], [address] [place], [place] [address]  POCT I-Stat EG7     Status: Abnormal   Collection Time: 03/19/19 11:43 AM  Result Value Ref Range   pH, Ven 7.443 (H) 7.250 - 7.430   pCO2, Ven 50.5 44.0 - 60.0 mmHg   pO2, Ven 42.0 32.0 - 45.0 mmHg   Bicarbonate 34.7 (H) 20.0 - 28.0 mmol/L   TCO2 36 (H) 22 - 32 mmol/L   O2 Saturation 80.0 %   Acid-Base Excess 9.0 (H) 0.0 - 2.0 mmol/L   Sodium 134 (L) 135 - 145 mmol/L   Potassium 4.0 3.5 - 5.1 mmol/L   Calcium, Ion 1.10 (L) 1.15 - 1.40 mmol/L   HCT 42.0 39.0 - 52.0 %   Hemoglobin 14.3 13.0 - 17.0 g/dL   Patient temperature 98.0 F    Collection site IV START    Drawn by Operator    Sample type VENOUS   Procalcitonin     Status: None   Collection Time: 03/19/19  1:01 PM  Result Value Ref Range   Procalcitonin <0.10 ng/mL    Comment:        Interpretation: PCT (Procalcitonin) <= 0.5 ng/mL: Systemic infection (sepsis) is not likely. Local bacterial infection is possible. (NOTE)       Sepsis PCT Algorithm           Lower Respiratory Tract                                      Infection PCT Algorithm    ----------------------------     ----------------------------         PCT < 0.25 ng/mL                PCT < 0.10 ng/mL         Strongly encourage             Strongly discourage   discontinuation of antibiotics    initiation of antibiotics    ----------------------------     -----------------------------       PCT 0.25 - 0.50 ng/mL            PCT 0.10 -  0.25 ng/mL               OR       >80% decrease in PCT            Discourage initiation of                                            antibiotics      Encourage discontinuation           of antibiotics    ----------------------------     -----------------------------         PCT >= 0.50 ng/mL              PCT 0.26 - 0.50 ng/mL               AND        <80% decrease in PCT             Encourage initiation of                                              antibiotics       Encourage continuation           of antibiotics    ----------------------------     -----------------------------        PCT >= 0.50 ng/mL                  PCT > 0.50 ng/mL               AND         increase in PCT                  Strongly encourage                                      initiation of antibiotics    Strongly encourage escalation           of antibiotics                                     -----------------------------                                           PCT <= 0.25 ng/mL                                                 OR                                        > 80% decrease in PCT  Discontinue / Do not initiate                                             antibiotics Performed at [facility], [place] [address] [place], [place] [address]   Sedimentation rate     Status: None   Collection Time: 03/19/19  1:01 PM  Result Value Ref Range   Sed Rate 10 0 - 16 mm/hr    Comment: Performed at [facility], [address] [place], [place] [address]  Ferritin (Iron Binding Protein)     Status: None   Collection Time: 03/19/19  1:01 PM  Result Value Ref Range   Ferritin 277 24 - 336 ng/mL    Comment: Performed at [facility], [place] [address] [place], [place] [address]  C-reactive protein     Status: None   Collection Time: 03/19/19  1:01 PM  Result Value Ref Range   CRP <0.8 <1.0 mg/dL    Comment: Performed at [facility], [place] [address], [place], [place] [address]  Lactate dehydrogenase     Status: Abnormal   Collection Time: 03/19/19  1:01 PM  Result Value Ref Range   LDH 240 (H) 98 - 192 U/L    Comment: Performed at [facility], [place] [address], [place], [place] [address]  Hepatic function panel     Status: Abnormal   Collection Time: 03/19/19  1:01 PM  Result Value Ref Range   Total Protein 6.5 6.5 - 8.1 g/dL   Albumin 3.6 3.5 - 5.0 g/dL   AST 29 15 - 41  U/L   ALT 46 (H) 0 - 44 U/L   Alkaline Phosphatase 54 38 - 126 U/L   Total Bilirubin 0.5 0.3 - 1.2 mg/dL   Bilirubin, Direct <0.1 0.0 - 0.2 mg/dL   Indirect Bilirubin NOT CALCULATED 0.3 - 0.9 mg/dL    Comment: Performed at [facility], [address] [place], [place] [address]  I-STAT 7, (LYTES, BLD GAS, ICA, H+H)     Status: Abnormal   Collection Time: 03/19/19  2:34 PM  Result Value Ref Range   pH, Arterial 7.533 (H) 7.350 - 7.450   pCO2 arterial 37.9 32.0 - 48.0 mmHg   pO2, Arterial 91.0 83.0 - 108.0 mmHg   Bicarbonate 32.0 (H) 20.0 - 28.0 mmol/L   TCO2 33 (H) 22 - 32 mmol/L   O2 Saturation 98.0 %   Acid-Base Excess 9.0 (H) 0.0 - 2.0 mmol/L   Sodium 131 (L) 135 - 145 mmol/L   Potassium 4.2 3.5 - 5.1 mmol/L   Calcium, Ion 1.14 (L) 1.15 - 1.40 mmol/L   HCT 36.0 (L) 39.0 - 52.0 %   Hemoglobin 12.2 (L) 13.0 - 17.0 g/dL   Patient temperature 98.0 F    Collection site RADIAL, ALLEN'S TEST ACCEPTABLE    Drawn by RT    Sample type ARTERIAL    Dg Chest Port 1 View  Result Date: 03/19/2019 CLINICAL DATA:  73 year old male with shortness of breath EXAM: PORTABLE CHEST 1 VIEW COMPARISON:  None. FINDINGS: Cardiomediastinal silhouette unchanged in size and contour. Calcifications of the aortic arch. Hyperinflation, similar to prior studies. No confluent airspace disease. No pleural effusion or pneumothorax. No displaced fracture. IMPRESSION: Emphysema, with no evidence of superimposed acute cardiopulmonary disease. Electronically Signed  By: Corrie Mckusick D.O.   On: 03/19/2019 11:44    Pending Labs Unresulted Labs (From admission, onward)    Start     Ordered   03/26/19 0500  Creatinine, serum  (enoxaparin (LOVENOX)    CrCl >/= 30 ml/min)  Weekly,   R    Comments:  while on enoxaparin therapy    03/19/19 2005   03/20/19 6761  Basic metabolic panel  Tomorrow morning,   R     03/19/19 2005   03/20/19 0500  CBC  Tomorrow morning,   R     03/19/19 2005   03/19/19 2014  Novel  Coronavirus, NAA (hospital order; send-out to ref lab)  (Novel Coronavirus, NAA ([place]; send-out to ref lab) with precautions panel)  Once,   R    Question:  Current symptoms  Answer:  Other (testing not indicated)   03/19/19 2014   03/19/19 2006  Expectorated sputum assessment w rflx to resp cult  Once,   R     03/19/19 2005   03/19/19 2002  CBC  (enoxaparin (LOVENOX)    CrCl >/= 30 ml/min)  Once,   R    Comments:  Baseline for enoxaparin therapy IF NOT ALREADY DRAWN.  Notify MD if PLT < 100 K.    03/19/19 2005   03/19/19 2002  Creatinine, serum  (enoxaparin (LOVENOX)    CrCl >/= 30 ml/min)  Once,   R    Comments:  Baseline for enoxaparin therapy IF NOT ALREADY DRAWN.    03/19/19 2005   03/19/19 1825  Respiratory Panel by PCR  (Respiratory virus panel with precautions)  Once,   R     03/19/19 1824   03/19/19 1415  Blood gas, arterial  Once,   STAT    Question:  Room air or oxygen  Answer:  Oxygen   03/19/19 1405   03/19/19 1044  Blood gas, venous  Once,   STAT     03/19/19 1046          Vitals/Pain Today's Vitals   03/19/19 1928 03/19/19 1929 03/19/19 1930 03/19/19 2000  BP:   127/85 (!) 163/79  Pulse:   (!) 110 (!) 103  Resp:   (!) 25 20  Temp:      TempSrc:      SpO2:  97% 97% 96%  Weight:      Height:      PainSc: 0-No pain       Isolation Precautions Droplet and Contact precautions  Medications Medications  aspirin EC tablet 81 mg (has no administration in time range)  simvastatin (ZOCOR) tablet 20 mg (has no administration in time range)  fish oil-omega-3 fatty acids capsule 1 g (has no administration in time range)  loratadine (CLARITIN) tablet 10 mg (has no administration in time range)  Ipratropium-Albuterol (COMBIVENT) respimat 2 puff (has no administration in time range)  enoxaparin (LOVENOX) injection 40 mg (has no administration in time range)  acetaminophen (TYLENOL) tablet 650 mg (has no administration in time range)    Or  acetaminophen  (TYLENOL) suppository 650 mg (has no administration in time range)  albuterol (PROVENTIL HFA;VENTOLIN HFA) 108 (90 Base) MCG/ACT inhaler 2 puff (has no administration in time range)  ondansetron (ZOFRAN) tablet 4 mg (has no administration in time range)    Or  ondansetron (ZOFRAN) injection 4 mg (has no administration in time range)  cefTRIAXone (ROCEPHIN) 1 g in sodium chloride 0.9 % 100 mL IVPB (has no administration in time  range)  azithromycin (ZITHROMAX) tablet 500 mg (has no administration in time range)    Followed by  azithromycin (ZITHROMAX) tablet 250 mg (has no administration in time range)  ipratropium-albuterol (DUONEB) 0.5-2.5 (3) MG/3ML nebulizer solution 3 mL (3 mLs Nebulization Given 03/19/19 1155)  predniSONE (DELTASONE) tablet 10 mg (10 mg Oral Given 03/19/19 1132)  magnesium sulfate IVPB 2 g 50 mL ( Intravenous Stopped 03/19/19 1158)    Mobility walks Low fall risk   Focused Assessments Pulmonary Assessment Handoff:  Lung sounds: Bilateral Breath Sounds: Diminished L Breath Sounds: Diminished R Breath Sounds: Diminished O2 Device: Nasal Cannula O2 Flow Rate (L/min): 2 L/min      R Recommendations: See Admitting Provider Note  Report given to:   Additional Notes:

## 2019-03-19 NOTE — ED Notes (Signed)
  Attempted to call report.  RN will call me back. 

## 2019-03-19 NOTE — ED Notes (Signed)
Left Radial Artery x 1 attempt for ABG. Pt tolerated well. No complications noted. Bleeding stopped.

## 2019-03-19 NOTE — ED Triage Notes (Signed)
Hx of COPD, on prednisone and nebs, he reports increased respiratory effort and decrease O2 sats on 2 liters oxygen.

## 2019-03-19 NOTE — ED Provider Notes (Signed)
Patient transferred from [facility] for admission at [facility] for COPD exacerbation with hypoxia and shortness of breath.  Plan of care is to call admitting hospitalist team upon arrival.  Hospitalist team was called who requested RVP ordered.  This was placed.  They will see patient for admission.  On my initial evaluation, low suspicion for coronavirus based on his negative answers to some of the questions we asked.  Hospitalist team will see patient admit.  They are concerned about coronavirus, they will order and send this test off.  Patient will be admitted for the management.    Clinical Impression: 1. COPD exacerbation ([place])     Disposition: Admit  This note was prepared with assistance of Dragon voice recognition software. Occasional wrong-word or sound-a-like substitutions may have occurred due to the inherent limitations of voice recognition software.      Darrek Leasure, Gwenyth Allegra, MD 03/19/19 [phone]

## 2019-03-19 NOTE — ED Notes (Signed)
Pt removed off Bipap. Hospitalist unwilling to take patient on bipap. Placed pateint back on 2LPM  [place] in which patient wears at home.

## 2019-03-19 NOTE — ED Notes (Signed)
m c charge rn called

## 2019-03-19 NOTE — ED Notes (Signed)
Pt is a transfer from [facility] ED. Pt was seen for shortness of breath and was placed on bipap for 2 hours prior to transfer to [facility]. Pt is 95% on 2L via [place] at present and reports that he wears oxygen at night only normally. Pt is alert and oriented x4 and denying any pain at this time.

## 2019-03-19 NOTE — ED Provider Notes (Signed)
[place] EMERGENCY DEPARTMENT Provider Note   CSN: [id number] Arrival date & time: 03/19/19  1015    History   Chief Complaint Chief Complaint  Patient presents with  . Shortness of Breath    HPI Paul [name] is a 73 y.o. male.     73 yo M with a chief complaint of shortness of breath.  This is been slowly worsening on him.  Was seen in the ED about 9 days ago with the same complaint.  Since then the patient feels that he is transition from using oxygen only at night at all times during the day.  States he walked a very short distance to the bathroom and then when he got back to his chair he felt very short of breath and checked his oxygen level and saw it was in the low 70s.  This was even while he was on oxygen.  He has had mild cough but denies production.  Denies fevers or chills.  Denies sick contacts.  Denies history of PE.  The history is provided by the patient.  Shortness of Breath  Severity:  Moderate Onset quality:  Gradual Duration:  1 day Timing:  Constant Progression:  Worsening Chronicity:  New Context: not activity   Relieved by:  Nothing Worsened by:  Nothing Ineffective treatments:  None tried Associated symptoms: no abdominal pain, no chest pain, no fever, no headaches, no rash and no vomiting     Past Medical History:  Diagnosis Date  . COPD (chronic obstructive pulmonary disease) ([place])   . High cholesterol     Patient Active Problem List   Diagnosis Date Noted  . Lung nodule < 6cm on CT 05/29/2016  . Former cigarette smoker 12/05/2015  . Chronic respiratory failure with hypoxia ([place]) 01/27/2013  . COPD (chronic obstructive pulmonary disease), gold stage C.   . High cholesterol     Past Surgical History:  Procedure Laterality Date  . CATARACT EXTRACTION Right   . SKIN SURGERY     shoulders and chest        Home Medications    Prior to Admission medications   Medication Sig Start Date End Date Taking? Authorizing  Provider  albuterol (PROVENTIL) (2.5 MG/3ML) 0.083% nebulizer solution Take 3 mLs (2.5 mg total) by nebulization every 6 (six) hours as needed for wheezing or shortness of breath. 02/28/19   [name], PA-C  benzonatate (TESSALON) 100 MG capsule Take 1 capsule (100 mg total) by mouth every 8 (eight) hours. 02/28/19   [name], PA-C  budesonide-formoterol (SYMBICORT) 160-4.5 MCG/ACT inhaler Inhale 2 puffs into the lungs 2 (two) times daily. 12/09/18   Mannam, Hart Robinsons, MD  budesonide-formoterol (SYMBICORT) 160-4.5 MCG/ACT inhaler Inhale 2 puffs into the lungs 2 (two) times daily. 03/04/19   Fenton Foy, NP  doxycycline (VIBRAMYCIN) 100 MG capsule Take 1 capsule (100 mg total) by mouth 2 (two) times daily. 03/10/19   Gareth Morgan, MD  fish oil-omega-3 fatty acids 1000 MG capsule Take 2 g by mouth daily.    [provider]  loratadine (CLARITIN) 10 MG tablet Take 10 mg by mouth daily.    [provider]  Multiple Vitamin (MULTIVITAMIN WITH MINERALS) TABS tablet Take 1 tablet by mouth daily.    [provider]  predniSONE (DELTASONE) 10 MG tablet Take 4 tablets daily for 4 days, 3 tablets daily for 3 days, 2 tablets daily for 2 days, 1 tablet daily for 2 days. 03/10/19   Gareth Morgan,  MD  predniSONE (DELTASONE) 10 MG tablet Take 60mg  for 3 days, 50mg  for 3 days, 40mg  for 3 days, 30mg  for 3 days, 20mg  for 3 days, 10mg  for 3 days and stop 03/16/19   [name], MD  Respiratory Therapy Supplies (FLUTTER) DEVI Use 2-3 times daily 07/04/15   Elsie Stain, MD  simvastatin (ZOCOR) 40 MG tablet Take 20 mg by mouth every evening.    [provider]  Tiotropium Bromide Monohydrate (SPIRIVA RESPIMAT) 2.5 MCG/ACT AERS Inhale 2 puffs into the lungs daily.    [provider]  Tiotropium Bromide Monohydrate (SPIRIVA RESPIMAT) 2.5 MCG/ACT AERS Inhale 2 puffs into the lungs daily. 03/04/19   Fenton Foy, NP    Family History Family History   Problem Relation Age of Onset  . Heart disease Brother   . Heart disease Mother   . Heart disease Father   . Cancer Paternal Uncle     Social History Social History   Tobacco Use  . Smoking status: Former Smoker    Packs/day: 2.00    Years: 52.00    Pack years: 104.00    Types: Cigarettes    Last attempt to quit: 02/03/2009    Years since quitting: 10.1  . Smokeless tobacco: Never Used  . Tobacco comment: Counseled to remain smoke free  Substance Use Topics  . Alcohol use: Yes    Alcohol/week: 0.0 standard drinks    Comment: occ  . Drug use: No     Allergies   Patient has no known allergies.   Review of Systems Review of Systems  Constitutional: Negative for chills and fever.  HENT: Negative for congestion and facial swelling.   Eyes: Negative for discharge and visual disturbance.  Respiratory: Positive for shortness of breath.   Cardiovascular: Negative for chest pain and palpitations.  Gastrointestinal: Negative for abdominal pain, diarrhea and vomiting.  Musculoskeletal: Negative for arthralgias and myalgias.  Skin: Negative for color change and rash.  Neurological: Negative for tremors, syncope and headaches.  Psychiatric/Behavioral: Negative for confusion and dysphoric mood.     Physical Exam Updated Vital Signs BP (!) 143/65   Pulse (!) 109   Temp 98 F (36.7 C) (Oral)   Resp (!) 21   Ht 5\' 6"  (1.676 m)   Wt 65.3 kg   SpO2 97%   BMI 23.24 kg/m   Physical Exam Vitals signs and nursing note reviewed.  Constitutional:      Appearance: He is well-developed.  HENT:     Head: Normocephalic and atraumatic.  Eyes:     Pupils: Pupils are equal, round, and reactive to light.  Neck:     Musculoskeletal: Normal range of motion and neck supple.     Vascular: No JVD.  Cardiovascular:     Rate and Rhythm: Regular rhythm. Tachycardia present.     Heart sounds: No murmur. No friction rub. No gallop.   Pulmonary:     Effort: Tachypnea and respiratory  distress present.     Breath sounds: Wheezing present.     Comments: Patient is tachypneic on my exam with retractions.  He has diminished lung sounds in all fields with prolonged expiration.  He does have faint wheezes diffusely. Abdominal:     General: There is no distension.     Tenderness: There is no guarding or rebound.  Musculoskeletal: Normal range of motion.  Skin:    Coloration: Skin is not pale.     Findings: No rash.  Neurological:  Mental Status: He is alert and oriented to person, place, and time.  Psychiatric:        Behavior: Behavior normal.      ED Treatments / Results  Labs (all labs ordered are listed, but only abnormal results are displayed) Labs Reviewed  CBC WITH DIFFERENTIAL/PLATELET - Abnormal; Notable for the following components:      Result Value   WBC 12.7 (*)    MCH 25.2 (*)    Neutro Abs 12.1 (*)    Lymphs Abs 0.4 (*)    All other components within normal limits  BASIC METABOLIC PANEL - Abnormal; Notable for the following components:   Sodium 134 (*)    Chloride 95 (*)    Glucose, Bld 124 (*)    All other components within normal limits  HEPATIC FUNCTION PANEL - Abnormal; Notable for the following components:   ALT 46 (*)    All other components within normal limits  POCT I-STAT EG7 - Abnormal; Notable for the following components:   pH, Ven 7.443 (*)    Bicarbonate 34.7 (*)    TCO2 36 (*)    Acid-Base Excess 9.0 (*)    Sodium 134 (*)    Calcium, Ion 1.10 (*)    All other components within normal limits  POCT I-STAT 7, (LYTES, BLD GAS, ICA,H+H) - Abnormal; Notable for the following components:   pH, Arterial 7.533 (*)    Bicarbonate 32.0 (*)    TCO2 33 (*)    Acid-Base Excess 9.0 (*)    Sodium 131 (*)    Calcium, Ion 1.14 (*)    HCT 36.0 (*)    Hemoglobin 12.2 (*)    All other components within normal limits  TROPONIN I  BRAIN NATRIURETIC PEPTIDE  SEDIMENTATION RATE  BLOOD GAS, VENOUS  PROCALCITONIN  FERRITIN  C-REACTIVE  PROTEIN  LACTATE DEHYDROGENASE  BLOOD GAS, ARTERIAL    EKG None  Radiology Dg Chest Port 1 View  Result Date: 03/19/2019 CLINICAL DATA:  73 year old male with shortness of breath EXAM: PORTABLE CHEST 1 VIEW COMPARISON:  None. FINDINGS: Cardiomediastinal silhouette unchanged in size and contour. Calcifications of the aortic arch. Hyperinflation, similar to prior studies. No confluent airspace disease. No pleural effusion or pneumothorax. No displaced fracture. IMPRESSION: Emphysema, with no evidence of superimposed acute cardiopulmonary disease. Electronically Signed   By: Corrie Mckusick D.O.   On: 03/19/2019 11:44    Procedures Procedures (including critical care time)  Medications Ordered in ED Medications  ipratropium-albuterol (DUONEB) 0.5-2.5 (3) MG/3ML nebulizer solution 3 mL (3 mLs Nebulization Given 03/19/19 1155)  predniSONE (DELTASONE) tablet 10 mg (10 mg Oral Given 03/19/19 1132)  magnesium sulfate IVPB 2 g 50 mL ( Intravenous Stopped 03/19/19 1158)     Initial Impression / Assessment and Plan / ED Course  I have reviewed the triage vital signs and the nursing notes.  Pertinent labs & imaging results that were available during my care of the patient were reviewed by me and considered in my medical decision making (see chart for details).        73 yo M with a chief complaint of shortness of breath.  Patient has been seen here 3 times in the past month for the same.  Most recent visit I reviewed the records and he had a CT angiogram of the chest that was negative for pulmonary embolism.  Patient thinks this feels like his COPD.  With his worsening oxygen demand I feel he likely needs to be admitted  to the hospital.  He was placed on BiPAP with some improvement of his symptoms.  Will obtain a laboratory evaluation.  He is endorsing no infectious symptoms with the exception of cough.  I think this is unlikely to be the novel coronavirus.  His BNP is normal his troponin is  negative.  Chest x-ray reviewed by me without focal infiltrate.  He is feeling much better though is currently on BiPAP.  Will discuss with the hospitalist for admission.  I discussed the case with Dr. Verlon Au, based on the patient's presentation and he wanted me to evaluate him over the course of an hour off of BiPAP.  He wanted to repeat blood gas.  He also requested some other lab work pro calcitonin sed rate CRP ferritin and LDH.  Repeat blood gas with good oxygenation on 2 L 91, his bicarb is elevated.  PCO2 is lower on the arterial stick which is likely to be expected.. Patient continues to be mildly tachypneic with a respiratory rate in the low 20s.  He was able to eat lunch without difficulty.  Talking in complete sentences.  Will rediscuss with the hospitalist.  Discussed again with Dr. Raliegh Ip, at this point he would like to evaluate the patient at bedside if possible and so we will transfer him to the [facility], ER.  I discussed the case with Dr. Ronnald Nian.  Accepted in transfer.  Please page the hospitalist on arrival so that he can be evaluated.  CRITICAL CARE Performed by: Cecilio Asper   Total critical care time: 80 minutes  Critical care time was exclusive of separately billable procedures and treating other patients.  Critical care was necessary to treat or prevent imminent or life-threatening deterioration.  Critical care was time spent personally by me on the following activities: development of treatment plan with patient and/or surrogate as well as nursing, discussions with consultants, evaluation of patient's response to treatment, examination of patient, obtaining history from patient or surrogate, ordering and performing treatments and interventions, ordering and review of laboratory studies, ordering and review of radiographic studies, pulse oximetry and re-evaluation of patient's condition.  The patients results and plan were reviewed and discussed.   Any x-rays  performed were independently reviewed by myself.   Differential diagnosis were considered with the presenting HPI.  Medications  ipratropium-albuterol (DUONEB) 0.5-2.5 (3) MG/3ML nebulizer solution 3 mL (3 mLs Nebulization Given 03/19/19 1155)  predniSONE (DELTASONE) tablet 10 mg (10 mg Oral Given 03/19/19 1132)  magnesium sulfate IVPB 2 g 50 mL ( Intravenous Stopped 03/19/19 1158)    Vitals:   03/19/19 1345 03/19/19 1400 03/19/19 1430 03/19/19 1500  BP:  (!) 174/73 (!) 167/71 (!) 143/65  Pulse: (!) 109 (!) 116 (!) 112 (!) 109  Resp: 13 (!) 31 (!) 24 (!) 21  Temp:      TempSrc:      SpO2: 98% 97% 98% 97%  Weight:      Height:        Final diagnoses:  COPD exacerbation (HCC)    Admission/ observation were discussed with the admitting physician, patient and/or family and they are comfortable with the plan.   Final Clinical Impressions(s) / ED Diagnoses   Final diagnoses:  COPD exacerbation [facility])    ED Discharge Orders    None       Deno Etienne, DO 03/19/19 1518

## 2019-03-19 NOTE — Telephone Encounter (Signed)
Spoke with pt's wife, Illa. She states that the pt's oxygen level is still dropping in the 70's. This morning it was reading 72% on 2L. Advised pt's wife that she needs to take the pt to hospital to be evaluated. Pt's wife verbalized understanding. Nothing further was needed.

## 2019-03-19 NOTE — H&P (Signed)
History and Physical    DOA: 03/19/2019  PCP: [name], MD  Patient coming from: Home  Chief Complaint: Worsening shortness of breath  HPI: Paul [name] is a 73 y.o. male with history h/o COPD, hyperlipidemia who at baseline was using 2 L O2 via nasal cannula only at night and follows [facility] (Dr. Vaughan Browner) as outpatient presents with worsening shortness of breath.  Patient reports that at baseline he has cough with white phlegm and is able to walk around doing his ADLs without shortness of breath and uses O2 only at night.  2 weeks back he developed productive cough with green phlegm and shortness of breath but no fevers.  He underwent CT chest with contrast on March 18 which was negative for PE or infiltrates.  He apparently was given doxycycline/prednisone course at that time which helped him transiently but symptoms worsened again past Monday with shortness of breath on minimum exertion/cough and he noticed his pulse ox was dropping to 74% .  He started using 2 L O2 nasal cannula continuous.  On Tuesday, he called pulmonary office given persistent symptoms and also pulse oximetry showing O2 sats 84% on exertion and 93% at rest in spite of using continuous O2.  He was advised by Dr. Vaughan Browner to use 60 mg prednisone x3 days followed by 10 mg taper every 3 days.  He was also advised to use nebulizer treatments every 3 hours and continue Symbicort inhaler twice daily.  Patient was supposed to start 50 mg prednisone today but woke up feeling extremely short of breath.  He states he is also been having bad bouts of cough at night with wheezing and bronchospasm.   He presented to [facility] ED where he was noted to be afebrile but hypoxic, required BiPAP for 3 hours, chest x-ray was negative for infiltrates.  Patient denies any recent history of travel or known sick contacts with flu positive or coronavirus positive patients.  He lives at home with his wife who is recovering from lung  cancer and completed chemotherapy 1 month back.   Hospitalist service was called to request direct admission to medical floor but ED to ED transfer facilitated for thorough clinical evaluation prior to requesting medical bed given coronavirus pandemic.  In the ED at [facility], patient has been saturating well on 3 L nasal cannula but appears tachypneic on talking full sentences.  He denies any chest pain, denies any fevers or leg swellings.  Review of Systems: As per HPI otherwise 10 point review of systems negative.    Past Medical History:  Diagnosis Date   COPD (chronic obstructive pulmonary disease) (HCC)    High cholesterol     Past Surgical History:  Procedure Laterality Date   CATARACT EXTRACTION Right    SKIN SURGERY     shoulders and chest    Social history:  reports that he quit smoking about 10 years ago. His smoking use included cigarettes. He has a 104.00 pack-year smoking history. He has never used smokeless tobacco. He reports current alcohol use. He reports that he does not use drugs.   No Known Allergies  Family History  Problem Relation Age of Onset   Heart disease Brother    Heart disease Mother    Heart disease Father    Cancer Paternal Uncle       Prior to Admission medications   Medication Sig Start Date End Date Taking? Authorizing Provider  albuterol (PROAIR HFA) 108 (90 Base) MCG/ACT  inhaler Inhale 2 puffs into the lungs every 6 (six) hours as needed for wheezing or shortness of breath.   Yes [provider]  albuterol (PROVENTIL) (2.5 MG/3ML) 0.083% nebulizer solution Take 3 mLs (2.5 mg total) by nebulization every 6 (six) hours as needed for wheezing or shortness of breath. Patient taking differently: Take 2.5 mg by nebulization every 3 (three) hours.  02/28/19  Yes [name], PA-C  aspirin EC 81 MG tablet Take 81 mg by mouth daily after supper.   Yes [provider]  budesonide-formoterol (SYMBICORT) 160-4.5 MCG/ACT  inhaler Inhale 2 puffs into the lungs 2 (two) times daily. 03/04/19  Yes Fenton Foy, NP  doxycycline (VIBRAMYCIN) 100 MG capsule Take 1 capsule (100 mg total) by mouth 2 (two) times daily. Patient taking differently: Take 100 mg by mouth 2 (two) times daily. 10 day course ordered 03/10/19 03/10/19  Yes Schlossman, Junie Panning, MD  fish oil-omega-3 fatty acids 1000 MG capsule Take 1 g by mouth daily after supper.    Yes [provider]  loratadine (CLARITIN) 10 MG tablet Take 10 mg by mouth daily.   Yes [provider]  OXYGEN Inhale 2 L into the lungs continuous.   Yes [provider]  predniSONE (DELTASONE) 10 MG tablet Take 52m for 3 days, 545mfor 3 days, 4011mor 3 days, 79m19mr 3 days, 20mg37m 3 days, 10mg 12m3 days and stop Patient taking differently: Take 10-60 mg by mouth See admin instructions. Tapered course ordered 03/16/19: Take 6 tablets (60mg) 106mouth daily for 3 days, then take 5 tablets (50mg) d18m for 3 days, then take 4 tablets (40mg) da56mfor 3 days, then take 3 tablets (79mg) dai22mor 3 days, then take 2 tablets (20mg) dail55mr 3 days, then take 1 tablet (10mg) daily35m 3 days, then stop 03/16/19  Yes Mannam, Praveen, MD  simvastatin (ZOCOR) 40 MG tablet Take 20 mg by mouth daily after supper.    Yes [provider]  Tiotropium Bromide Monohydrate (SPIRIVA RESPIMAT) 2.5 MCG/ACT AERS Inhale 2 puffs into the lungs daily. Patient taking differently: Inhale 2 puffs into the lungs daily at 3 pm.  03/04/19  Yes Nichols, TonFenton Foyatate (TESSALON) 100 MG capsule Take 1 capsule (100 mg total) by mouth every 8 (eight) hours. Patient not taking: Reported on 03/19/2019 02/28/19   Layden, LindVolanda Napoleonsonide-formoterol (SYMBICORT) [facility] inhaler Inhale 2 puffs into the lungs 2 (two) times daily. Patient not taking: Reported on 03/19/2019 12/09/18   Mannam, PravMarshell GarfinkelSONE (DELTASONE) 10 MG tablet Take 4 tablets daily for 4  days, 3 tablets daily for 3 days, 2 tablets daily for 2 days, 1 tablet daily for 2 days. Patient not taking: Reported on 03/19/2019 03/10/19   Schlossman, Gareth Morgan[employer] Supp[employer] (FLUTTER) DEVI Use 2-3 times daily 07/04/15   Wright, PatrElsie Stainical Exam: Vitals:   03/19/19 1845 03/19/19 1900 03/19/19 1929 03/19/19 1930  BP: (!) 161/95 (!) 159/74  127/85  Pulse: (!) 107 (!) 104  (!) 110  Resp: (!) 22 (!) 21  (!) 25  Temp:      TempSrc:      SpO2: 95% 97% 97% 97%  Weight:      Height:        Constitutional: Awake alert oriented, in mild respiratory distress while talking full sentences or exertion Eyes: PERRL, lids and conjunctivae normal ENMT: Mucous  membranes are moist. Posterior pharynx clear of any exudate or lesions.Normal dentition.  Neck: normal, supple, no masses, no thyromegaly Respiratory: Mild scattered rhonchi, no crackles.  Accessory muscle use while talking full sentences, mild respiratory distress Cardiovascular: Regular rate and rhythm, no murmurs / rubs / gallops. No extremity edema. 2+ pedal pulses. No carotid bruits.  Abdomen: no tenderness, no masses palpated. No hepatosplenomegaly. Bowel sounds positive.  Musculoskeletal: no clubbing / cyanosis. No joint deformity upper and lower extremities. Good ROM, no contractures. Normal muscle tone.  Neurologic: CN 2-12 grossly intact. Sensation intact, DTR normal. Strength 5/5 in all 4.  Psychiatric: Normal judgment and insight. Alert and oriented x 3. Normal mood.  SKIN/catheters: no rashes, lesions, ulcers. No induration  Labs on Admission: I have personally reviewed following labs and imaging studies  CBC: Recent Labs  Lab 03/19/19 1056 03/19/19 1143 03/19/19 1434  WBC 12.7*  --   --   NEUTROABS 12.1*  --   --   HGB 13.2 14.3 12.2*  HCT 43.0 42.0 36.0*  MCV 82.2  --   --   PLT 349  --   --    Basic Metabolic Panel: Recent Labs  Lab 03/19/19 1056 03/19/19 1143 03/19/19 1434  NA 134*  134* 131*  K 4.1 4.0 4.2  CL 95*  --   --   CO2 30  --   --   GLUCOSE 124*  --   --   BUN 9  --   --   CREATININE 0.62  --   --   CALCIUM 9.1  --   --    GFR: Estimated Creatinine Clearance: 74.2 mL/min (by C-G formula based on SCr of 0.62 mg/dL). Liver Function Tests: Recent Labs  Lab 03/19/19 1301  AST 29  ALT 46*  ALKPHOS 54  BILITOT 0.5  PROT 6.5  ALBUMIN 3.6   No results for input(s): LIPASE, AMYLASE in the last 168 hours. No results for input(s): AMMONIA in the last 168 hours. Coagulation Profile: No results for input(s): INR, PROTIME in the last 168 hours. Cardiac Enzymes: Recent Labs  Lab 03/19/19 1047  TROPONINI <0.03   BNP (last 3 results) No results for input(s): PROBNP in the last 8760 hours. HbA1C: No results for input(s): HGBA1C in the last 72 hours. CBG: No results for input(s): GLUCAP in the last 168 hours. Lipid Profile: No results for input(s): CHOL, HDL, LDLCALC, TRIG, CHOLHDL, LDLDIRECT in the last 72 hours. Thyroid Function Tests: No results for input(s): TSH, T4TOTAL, FREET4, T3FREE, THYROIDAB in the last 72 hours. Anemia Panel: Recent Labs    03/19/19 1301  FERRITIN 277   Urine analysis:    Component Value Date/Time   COLORURINE YELLOW 03/03/2019 1325   APPEARANCEUR CLEAR 03/03/2019 1325   LABSPEC <1.005 (L) 03/03/2019 1325   PHURINE 6.5 03/03/2019 1325   GLUCOSEU NEGATIVE 03/03/2019 1325   HGBUR NEGATIVE 03/03/2019 1325   BILIRUBINUR NEGATIVE 03/03/2019 1325   KETONESUR NEGATIVE 03/03/2019 1325   PROTEINUR NEGATIVE 03/03/2019 1325   NITRITE NEGATIVE 03/03/2019 1325   LEUKOCYTESUR NEGATIVE 03/03/2019 1325    Radiological Exams on Admission: Dg Chest Port 1 View  Result Date: 03/19/2019 CLINICAL DATA:  73 year old male with shortness of breath EXAM: PORTABLE CHEST 1 VIEW COMPARISON:  None. FINDINGS: Cardiomediastinal silhouette unchanged in size and contour. Calcifications of the aortic arch. Hyperinflation, similar to prior  studies. No confluent airspace disease. No pleural effusion or pneumothorax. No displaced fracture. IMPRESSION: Emphysema, with no evidence of superimposed acute  disease. Electronically Signed   By: Corrie Mckusick D.O.   On: 03/19/2019 11:44    EKG: Independently reviewed.  Tachycardia, right atrial enlargement     Assessment and Plan:   1.  Acute on chronic respiratory failure: Likely secondary to COPD exacerbation.  Patient does not have fever but has been on prednisone for 2 weeks.  He has not had significant clinical improvement with steroids/doxycycline/frequent neb treatments.  He required BiPAP therapy x3 hours at outside ED.  Chest x-ray is negative for infiltrates.  Recent CT chest on March 18- for PE or infiltrates.  It did show lung nodules.  Viral respiratory bio fire sent from ED pending at this time.  Discussed with pulmonary Dr. Vaughan Browner who is well acquainted with this patient: Recommended to rule out coronavirus if respiratory panel negative for other pathogens.  Accordingly, will admit to "low risk Covid rule out" bed with droplet precautions.  Will avoid IV steroids/nebulizer treatments for now.  Combivent MDI every 6 hours, albuterol MDI as needed and prednisone taper to be resumed along with empiric antibiotics (Rocephin/azithromycin) as recommended by pulmonary.  Taper O2 as tolerated, currently saturating well on 3 L at rest.  ABG reassuring, mild metabolic alkalosis.  ESR reassuring at only 10.  LDH mildly elevated.  2.  Hyperlipidemia: Resume home medications.  3.  Sinus tachycardia/elevated blood pressure: Likely situational in the setting of problem #1.  Will defer checking TSH as patient been on prednisone recently.  Hydralazine PRN available.  Treat problem #1  DVT prophylaxis: Lovenox   Code Status: Full code  Family Communication: Discussed with patient. Health care proxy would be his wife Consults called: Discussed with pulmonary Dr.  Vaughan Browner Admission status: I certify that at the point of admission it is my clinical judgment that the patient will require inpatient hospital care spanning beyond 2 midnights from the point of admission due to high intensity of service and high frequency of surveillance required.Inpatient status is judged to be reasonable and necessary in order to provide the required intensity of service to ensure the patient's safety. The patient's presenting symptoms, physical exam findings, and initial radiographic and laboratory data in the context of their chronic comorbidities is felt to place them at high risk for further clinical deterioration. The following factors support the patient status of inpatient: Acute on chronic respiratory failure, failing outpatient antibiotic and steroid treatment   Guilford Shi MD [facility] Pager [phone]  If 7PM-7AM, please contact night-coverage [email or web address] Password [facility]  03/19/2019, 7:36 PM

## 2019-03-19 NOTE — ED Notes (Addendum)
C/o o2 sats dropping especially at night  At present on 02 2l talking complete sentences w sat upper 90's  If any exertion  sats drop even on o2

## 2019-03-20 DIAGNOSIS — I4891 Unspecified atrial fibrillation: Secondary | ICD-10-CM

## 2019-03-20 DIAGNOSIS — J449 Chronic obstructive pulmonary disease, unspecified: Secondary | ICD-10-CM

## 2019-03-20 LAB — CBC
HCT: 42.1 % (ref 39.0–52.0)
Hemoglobin: 12.7 g/dL — ABNORMAL LOW (ref 13.0–17.0)
MCH: 24.8 pg — ABNORMAL LOW (ref 26.0–34.0)
MCHC: 30.2 g/dL (ref 30.0–36.0)
MCV: 82.2 fL (ref 80.0–100.0)
Platelets: 331 K/uL (ref 150–400)
RBC: 5.12 MIL/uL (ref 4.22–5.81)
RDW: 14.4 % (ref 11.5–15.5)
WBC: 11.4 K/uL — ABNORMAL HIGH (ref 4.0–10.5)
nRBC: 0 % (ref 0.0–0.2)

## 2019-03-20 LAB — BASIC METABOLIC PANEL
Anion gap: 9 (ref 5–15)
BUN: 8 mg/dL (ref 8–23)
CO2: 31 mmol/L (ref 22–32)
Calcium: 8.7 mg/dL — ABNORMAL LOW (ref 8.9–10.3)
Chloride: 99 mmol/L (ref 98–111)
Creatinine, Ser: 0.72 mg/dL (ref 0.61–1.24)
GFR calc Af Amer: >60 mL/min (ref >60)
GFR calc non Af Amer: >60 mL/min (ref >60)
Glucose, Bld: 102 mg/dL — ABNORMAL HIGH (ref 70–99)
Potassium: 3.7 mmol/L (ref 3.5–5.1)
Sodium: 139 mmol/L (ref 135–145)

## 2019-03-20 MED ORDER — ATORVASTATIN CALCIUM 10 MG PO TABS
10.0000 mg | ORAL_TABLET | Freq: Every day | ORAL | Status: DC
  Administered 2019-03-20 – 2019-03-22 (×3): 10 mg via ORAL
  Filled 2019-03-20 (×4): qty 1

## 2019-03-20 MED ORDER — FLUTICASONE FUROATE-VILANTEROL 200-25 MCG/INH IN AEPB
1.0000 | INHALATION_SPRAY | Freq: Every day | RESPIRATORY_TRACT | Status: DC
  Administered 2019-03-21 – 2019-03-25 (×5): 1 via RESPIRATORY_TRACT
  Filled 2019-03-20: qty 28

## 2019-03-20 MED ORDER — DILTIAZEM HCL 60 MG PO TABS: 60.0000 mg | ORAL_TABLET | Freq: Four times a day (QID) | ORAL | Status: DC

## 2019-03-20 MED ORDER — DILTIAZEM HCL 30 MG PO TABS
30.0000 mg | ORAL_TABLET | Freq: Four times a day (QID) | ORAL | Status: DC
  Administered 2019-03-20: 30 mg via ORAL
  Filled 2019-03-20: qty 1

## 2019-03-20 MED ORDER — METOPROLOL TARTRATE 5 MG/5ML IV SOLN
5.0000 mg | INTRAVENOUS | Status: AC | PRN
  Administered 2019-03-20: 5 mg via INTRAVENOUS

## 2019-03-20 MED ORDER — DILTIAZEM HCL 25 MG/5ML IV SOLN
15.0000 mg | Freq: Once | INTRAVENOUS | Status: AC
  Administered 2019-03-20: 15 mg via INTRAVENOUS
  Filled 2019-03-20: qty 5

## 2019-03-20 MED ORDER — UMECLIDINIUM BROMIDE 62.5 MCG/INH IN AEPB
1.0000 | INHALATION_SPRAY | Freq: Every day | RESPIRATORY_TRACT | Status: DC
  Administered 2019-03-21 – 2019-03-25 (×5): 1 via RESPIRATORY_TRACT
  Filled 2019-03-20: qty 7

## 2019-03-20 MED ORDER — DILTIAZEM HCL-DEXTROSE 100-5 MG/100ML-% IV SOLN (PREMIX)
5.0000 mg/h | INTRAVENOUS | Status: DC
  Administered 2019-03-20: 5 mg/h via INTRAVENOUS
  Administered 2019-03-21 (×2): 15 mg/h via INTRAVENOUS
  Filled 2019-03-20 (×3): qty 100

## 2019-03-20 MED ORDER — DILTIAZEM LOAD VIA INFUSION
20.0000 mg | Freq: Once | INTRAVENOUS | Status: DC
  Filled 2019-03-20: qty 20

## 2019-03-20 MED ORDER — TIOTROPIUM BROMIDE MONOHYDRATE 2.5 MCG/ACT IN AERS: 2.0000 | INHALATION_SPRAY | Freq: Every day | RESPIRATORY_TRACT | Status: DC

## 2019-03-20 MED ORDER — METOPROLOL TARTRATE 5 MG/5ML IV SOLN
5.0000 mg | INTRAVENOUS | Status: AC | PRN
  Administered 2019-03-20 (×2): 5 mg via INTRAVENOUS
  Filled 2019-03-20 (×2): qty 5

## 2019-03-20 MED ORDER — MELATONIN 3 MG PO TABS
3.0000 mg | ORAL_TABLET | Freq: Every evening | ORAL | Status: DC | PRN
  Administered 2019-03-20 – 2019-03-25 (×4): 3 mg via ORAL
  Filled 2019-03-20 (×9): qty 1

## 2019-03-20 MED ORDER — METOPROLOL TARTRATE 5 MG/5ML IV SOLN
INTRAVENOUS | Status: AC
  Filled 2019-03-20: qty 5

## 2019-03-20 MED ORDER — ALPRAZOLAM 0.25 MG PO TABS
0.2500 mg | ORAL_TABLET | Freq: Once | ORAL | Status: AC
  Administered 2019-03-20: 0.25 mg via ORAL
  Filled 2019-03-20: qty 1

## 2019-03-20 MED ORDER — DILTIAZEM HCL 25 MG/5ML IV SOLN
10.0000 mg | Freq: Once | INTRAVENOUS | Status: AC
  Administered 2019-03-20: 10 mg via INTRAVENOUS
  Filled 2019-03-20: qty 5

## 2019-03-20 MED ORDER — NON FORMULARY: 3.0000 mg | Freq: Every evening | Status: DC | PRN

## 2019-03-20 MED ORDER — APIXABAN 5 MG PO TABS
5.0000 mg | ORAL_TABLET | Freq: Two times a day (BID) | ORAL | Status: DC
  Administered 2019-03-20 – 2019-03-25 (×11): 5 mg via ORAL
  Filled 2019-03-20 (×11): qty 1

## 2019-03-20 NOTE — Progress Notes (Signed)
EKG Done per order. Md made aware via text page.

## 2019-03-20 NOTE — Plan of Care (Signed)
Pt just recently admitted not yet 24hrs, will continue to monitor.

## 2019-03-20 NOTE — Progress Notes (Addendum)
PROGRESS NOTE    Veronica Guerrant  FGH:[id number] DOB: 1946-06-12 DOA: 03/19/2019 PCP: [name], MD      Brief Narrative:  Mr. Harty is a 73 y.o. 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  Patient was in [place] until about a month ago, started to get SOB with exertion.  Seen in ER on 3/8, 3/11, 3/18; seen by Pulm 3/12.  Normal ECG on 3/11 and 18, CTA on 3/18 negative for PE or pneumonia.  Complained 3/11 of palpitations and palps with exertion.  Nebs and steroids rx'd several times with no improvement at all.  Now returns to ER having dyspnea on exertion.  No fever, chills, malaise, cough more than baseline.  No chest pressure but positive for palpitations/racing.    In ER, CXR clear.  Afebrile.  Tested for COVID and admitted for COPD exacerbation.  HRs in 110s.           Assessment & Plan:  New onset atrial fibrillation HR tachycardic, narrow and irregular on tele all night.  ECG obtained this morning, personally reviewed by me, shows Afib, no ST depresssions.  This is a new Afib diagnosis.  CHA2DS2-VASc Score = 2 (age, PVD).   HR still in 140s now.  Good response to 10 mg IV earlier. -Start Eliquis -Stop aspirin -Diltiazem 15 mg IV push now then 60 PO q6hrs -Check TSH -Echocardiogram after discharge given CoV   COPD Unclear if there is an exacerbation.  CXR clear, no clinical symptoms of pneumonia. -Hold steroids -Resume home inhalers -Stop antibiotics  Coronavirus rule out -Follow SARS-CoV-2 testing - Negative pressure room, droplet and contact precautions        MDM and disposition: The below labs and imaging reports were reviewed and summarized above.  Medication management as above.  The patient was admitted with new onset atrial fibrillation and hypoxia requiring supplemental O2.  He still requires O2 with any exertion, which is an increase from his normal.    Will start new anticoagulant, repeat IV diltiazem.          DVT prophylaxis: NN/A on eliquis Code Status: FULL Family Communication: Daughter by phone    Consultants:   None  Procedures:   None  Antimicrobials:   Ceftriaxone x1  Azithromycin x1    Subjective: Feeling still heart racing, chest tight, OOB.  No wheezing, cough more than usual.  No flu-like symptoms.  No headache.  No confusion.    Objective: Vitals:   03/20/19 0807 03/20/19 0837 03/20/19 1039 03/20/19 1300  BP: 139/69  (!) 129/109 134/77  Pulse: (!) 111  (!) 142 94  Resp:      Temp: 98 F (36.7 C)     TempSrc: Oral     SpO2: 99% 95%    Weight:      Height:        Intake/Output Summary (Last 24 hours) at 03/20/2019 1412 Last data filed at 03/20/2019 1343 Gross per 24 hour  Intake 100 ml  Output 600 ml  Net -500 ml   Filed Weights   03/19/19 1028 03/19/19 2131  Weight: 65.3 kg 65.3 kg    Examination: General appearance:  adult male, alert and in no acute distress.   HEENT: Anicteric, conjunctiva pink, lids and lashes normal. No nasal deformity, discharge, epistaxis.  Lips moist, edentulous, OP moist, no oral lesions, hearing normal.   Skin: Warm and dry.  no jaundice.  No suspicious rashes or lesions. Cardiac:  tachy, irregular, nl S1-S2, no murmurs appreciated.  Capillary refill is brisk.  JVP normal.  No LE edema.  Radia pulses 2+ and symmetric. Respiratory: Normal respiratory rate and rhythm.  No wheezing. Abdomen: Abdomen soft.   No TTP. No ascites, distension, hepatosplenomegaly.   MSK: No deformities or effusions. Neuro: Awake and alert.  EOMI, moves all extremities. Speech fluent.    Psych: Sensorium intact and responding to questions, attention normal. Affect normal.  Judgment and insight appear normal.    Data Reviewed: I have personally reviewed following labs and imaging studies:  CBC: Recent Labs  Lab 03/19/19 1056 03/19/19 1143 03/19/19 1434 03/19/19 2212 03/20/19 0607  WBC 12.7*  --   --  10.3 11.4*  NEUTROABS 12.1*  --   --    --   --   HGB 13.2 14.3 12.2* 12.3* 12.7*  HCT 43.0 42.0 36.0* 38.5* 42.1  MCV 82.2  --   --  81.4 82.2  PLT 349  --   --  336 408   Basic Metabolic Panel: Recent Labs  Lab 03/19/19 1056 03/19/19 1143 03/19/19 1434 03/19/19 2212 03/20/19 0607  NA 134* 134* 131*  --  139  K 4.1 4.0 4.2  --  3.7  CL 95*  --   --   --  99  CO2 30  --   --   --  31  GLUCOSE 124*  --   --   --  102*  BUN 9  --   --   --  8  CREATININE 0.62  --   --  0.75 0.72  CALCIUM 9.1  --   --   --  8.7*   GFR: Estimated Creatinine Clearance: 74.2 mL/min (by C-G formula based on SCr of 0.72 mg/dL). Liver Function Tests: Recent Labs  Lab 03/19/19 1301  AST 29  ALT 46*  ALKPHOS 54  BILITOT 0.5  PROT 6.5  ALBUMIN 3.6   No results for input(s): LIPASE, AMYLASE in the last 168 hours. No results for input(s): AMMONIA in the last 168 hours. Coagulation Profile: No results for input(s): INR, PROTIME in the last 168 hours. Cardiac Enzymes: Recent Labs  Lab 03/19/19 1047  TROPONINI <0.03   BNP (last 3 results) No results for input(s): PROBNP in the last 8760 hours. HbA1C: No results for input(s): HGBA1C in the last 72 hours. CBG: No results for input(s): GLUCAP in the last 168 hours. Lipid Profile: No results for input(s): CHOL, HDL, LDLCALC, TRIG, CHOLHDL, LDLDIRECT in the last 72 hours. Thyroid Function Tests: No results for input(s): TSH, T4TOTAL, FREET4, T3FREE, THYROIDAB in the last 72 hours. Anemia Panel: Recent Labs    03/19/19 1301  FERRITIN 277   Urine analysis:    Component Value Date/Time   COLORURINE YELLOW 03/03/2019 1325   APPEARANCEUR CLEAR 03/03/2019 1325   LABSPEC <1.005 (L) 03/03/2019 1325   PHURINE 6.5 03/03/2019 1325   GLUCOSEU NEGATIVE 03/03/2019 1325   HGBUR NEGATIVE 03/03/2019 1325   BILIRUBINUR NEGATIVE 03/03/2019 1325   KETONESUR NEGATIVE 03/03/2019 1325   PROTEINUR NEGATIVE 03/03/2019 1325   NITRITE NEGATIVE 03/03/2019 1325   LEUKOCYTESUR NEGATIVE 03/03/2019  1325   Sepsis Labs: @LABRCNTIP (procalcitonin:4,lacticacidven:4)  ) Recent Results (from the past 240 hour(s))  Respiratory Panel by PCR     Status: None   Collection Time: 03/19/19  6:41 PM  Result Value Ref Range Status   Adenovirus NOT DETECTED NOT DETECTED Final   Coronavirus 229E NOT DETECTED NOT DETECTED Final  Comment: (NOTE) The Coronavirus on the Respiratory Panel, DOES NOT test for the novel  Coronavirus (2019 nCoV)    Coronavirus HKU1 NOT DETECTED NOT DETECTED Final   Coronavirus NL63 NOT DETECTED NOT DETECTED Final   Coronavirus OC43 NOT DETECTED NOT DETECTED Final   Metapneumovirus NOT DETECTED NOT DETECTED Final   Rhinovirus / Enterovirus NOT DETECTED NOT DETECTED Final   Influenza A NOT DETECTED NOT DETECTED Final   Influenza B NOT DETECTED NOT DETECTED Final   Parainfluenza Virus 1 NOT DETECTED NOT DETECTED Final   Parainfluenza Virus 2 NOT DETECTED NOT DETECTED Final   Parainfluenza Virus 3 NOT DETECTED NOT DETECTED Final   Parainfluenza Virus 4 NOT DETECTED NOT DETECTED Final   Respiratory Syncytial Virus NOT DETECTED NOT DETECTED Final   Bordetella pertussis NOT DETECTED NOT DETECTED Final   Chlamydophila pneumoniae NOT DETECTED NOT DETECTED Final   Mycoplasma pneumoniae NOT DETECTED NOT DETECTED Final    Comment: Performed at [facility], [place] [address] [place], [place] [address]         Radiology Studies: Dg Chest [place] 1 View  Result Date: 03/19/2019 CLINICAL DATA:  73 year old male with shortness of breath EXAM: PORTABLE CHEST 1 VIEW COMPARISON:  None. FINDINGS: Cardiomediastinal silhouette unchanged in size and contour. Calcifications of the aortic arch. Hyperinflation, similar to prior studies. No confluent airspace disease. No pleural effusion or pneumothorax. No displaced fracture. IMPRESSION: Emphysema, with no evidence of superimposed acute cardiopulmonary disease. Electronically Signed   By: Corrie Mckusick D.O.   On: 03/19/2019 11:44         Scheduled Meds: . aspirin EC  81 mg Oral QPC supper  . azithromycin  250 mg Oral Daily  . diltiazem  30 mg Oral Q6H  . enoxaparin (LOVENOX) injection  40 mg Subcutaneous Q24H  . Ipratropium-Albuterol  2 puff Inhalation Q6H  . loratadine  10 mg Oral Daily  . omega-3 acid ethyl esters  1 g Oral QPC supper  . simvastatin  20 mg Oral QPC supper   Continuous Infusions: . cefTRIAXone (ROCEPHIN)  IV Stopped (03/19/19 2156)     LOS: 1 day    Time spent: 35 minutes    Edwin Dada, MD [facility] 03/20/2019, 2:12 PM     Please page through [place]:  [email or web address] Password [email or web address] If 7PM-7AM, please contact night-coverage     During this encounter: Patient Isolation: Droplet + Contact HCP PPE: Surgical mask, eye protection, gown, and gloves Patient PPE: None

## 2019-03-21 LAB — BASIC METABOLIC PANEL
Anion gap: 10 (ref 5–15)
BUN: 8 mg/dL (ref 8–23)
CO2: 30 mmol/L (ref 22–32)
Calcium: 8.5 mg/dL — ABNORMAL LOW (ref 8.9–10.3)
Chloride: 96 mmol/L — ABNORMAL LOW (ref 98–111)
Creatinine, Ser: 0.72 mg/dL (ref 0.61–1.24)
GFR calc Af Amer: >60 mL/min (ref >60)
GFR calc non Af Amer: >60 mL/min (ref >60)
Glucose, Bld: 128 mg/dL — ABNORMAL HIGH (ref 70–99)
Potassium: 3.8 mmol/L (ref 3.5–5.1)
Sodium: 136 mmol/L (ref 135–145)

## 2019-03-21 LAB — MAGNESIUM: Magnesium: 2 mg/dL (ref 1.7–2.4)

## 2019-03-21 MED ORDER — ALPRAZOLAM 0.25 MG PO TABS
0.2500 mg | ORAL_TABLET | Freq: Once | ORAL | Status: AC
  Administered 2019-03-21: 0.25 mg via ORAL
  Filled 2019-03-21: qty 1

## 2019-03-21 MED ORDER — DILTIAZEM HCL 60 MG PO TABS
60.0000 mg | ORAL_TABLET | Freq: Four times a day (QID) | ORAL | Status: AC
  Administered 2019-03-21 – 2019-03-22 (×7): 60 mg via ORAL
  Filled 2019-03-21 (×7): qty 1

## 2019-03-21 NOTE — Progress Notes (Signed)
PROGRESS NOTE    Paul [name]  [id number]:[id number] DOB: December 25, 1945 DOA: 03/19/2019 PCP: [name], MD      Brief Narrative:  [name] is a 73 y.o. 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  Patient was in [place] until about a month ago, started to get SOB with exertion.  Seen in ER on 3/8, 3/11, 3/18; seen by Pulm 3/12.  Normal ECG on 3/11 and 18, CTA on 3/18 negative for PE or pneumonia.  Complained 3/11 of palpitations and palps with exertion.  Nebs and steroids rx'd several times with no improvement at all.  Now returns to ER having dyspnea on exertion.  No fever, chills, malaise, cough more than baseline.  No chest pressure but positive for palpitations/racing.    In ER, CXR clear.  Afebrile.  Tested for COVID and admitted for COPD exacerbation.  HRs in 110s.           Assessment & Plan:  New onset atrial fibrillation HR tachycardic, narrow and irregular on tele all night.  ECG obtained this morning, personally reviewed by me, shows Afib, no ST depresssions.  This is a new Afib diagnosis.  CHA2DS2-VASc Score = 2 (age, PVD).   Started diltiazem drip overnight, converted to sinus around noon today.    -Continue Eliquis -Stop aspirin -Start diltiazem 60 mg q6h and stop drip -Check TSH -Echocardiogram after discharge given CoV   COPD Unclear if there is an exacerbation.  CXR clear, no clinical symptoms of pneumonia. -Hold steroids -Continue home inhalers -Stop antibiotics  Coronavirus rule out -Follow SARS-CoV-2 testing - Negative pressure room, droplet and contact precautions        MDM and disposition: The below labs and imaging reports were reviewed and summarized above.  Medication management as above, including anticoagulation.  The patient was admitted with new onset atrial fibrillation hypoxia requiring supplemental O2.  He required upgraded level of care and diltiazem infusion overnight, but this morning his heart rate has  converted to sinus rhythm.  Will change to oral diltiazem continue anticoagulation.  If his heart rate remains normal, likely home in 24 hours.           DVT prophylaxis: N/A on eliquis Code Status: FULL Family Communication: Daughter by phone    Consultants:   None  Procedures:   None  Antimicrobials:   Ceftriaxone x1  Azithromycin x1    Subjective: Still feeling dyspnea, chest tightness, extreme shortness of breath.  (This was prior to conversion to sinus rhythm).  No wheezing, cough, flulike symptoms, headache, confusion, fever.     Objective: Vitals:   03/21/19 1200 03/21/19 1243 03/21/19 1259 03/21/19 1518  BP: (!) 118/58   (!) 152/59  Pulse: 91  95 (!) 102  Resp: 15  (!) 26 (!) 22  Temp: 98 F (36.7 C)   98.3 F (36.8 C)  TempSrc: Oral   Oral  SpO2: 94% (!) 84% 91% 91%  Weight:      Height:        Intake/Output Summary (Last 24 hours) at 03/21/2019 1525 Last data filed at 03/21/2019 1243 Gross per 24 hour  Intake 440.08 ml  Output 900 ml  Net -459.92 ml   Filed Weights   03/19/19 1028 03/19/19 2131  Weight: 65.3 kg 65.3 kg    Examination: General appearance: Well adult male, lying in bed, no acute distress, interactive.   HEENT: Anicteric, conjunctival pink, lids and lashes normal.  No nasal  deformity, discharge, or epistaxis.  Lips moist, edentulous, oropharynx moist, no oral lesions. Skin: Skin warm and dry without suspicious rashes or lesions. Cardiac: Tachycardic, regular, no murmurs appreciated.  Capillary refill brisk, JVP normal, no lower extremity edema Respiratory: [place] effort labored, tachypneic, pursed lip breathing, shortness of breath, diminished throughout, no wheezes appreciated.  No rales. Abdomen: Abdomen soft, no tenderness to palpation, no ascites or distention. MSK: No deformities or effusions. Neuro: Awake and alert, extraocular movements intact, moves all extremities, speech fluent.    Psych: Sensorium intact and  responding to questions, attention normal, affect normal, judgment and insight appear normal.    Data Reviewed: I have personally reviewed following labs and imaging studies:  CBC: Recent Labs  Lab 03/19/19 1056 03/19/19 1143 03/19/19 1434 03/19/19 2212 03/20/19 0607  WBC 12.7*  --   --  10.3 11.4*  NEUTROABS 12.1*  --   --   --   --   HGB 13.2 14.3 12.2* 12.3* 12.7*  HCT 43.0 42.0 36.0* 38.5* 42.1  MCV 82.2  --   --  81.4 82.2  PLT 349  --   --  336 740   Basic Metabolic Panel: Recent Labs  Lab 03/19/19 1056 03/19/19 1143 03/19/19 1434 03/19/19 2212 03/20/19 0607 03/21/19 0320  NA 134* 134* 131*  --  139 136  K 4.1 4.0 4.2  --  3.7 3.8  CL 95*  --   --   --  99 96*  CO2 30  --   --   --  31 30  GLUCOSE 124*  --   --   --  102* 128*  BUN 9  --   --   --  8 8  CREATININE 0.62  --   --  0.75 0.72 0.72  CALCIUM 9.1  --   --   --  8.7* 8.5*  MG  --   --   --   --   --  2.0   GFR: Estimated Creatinine Clearance: 74.2 mL/min (by C-G formula based on SCr of 0.72 mg/dL). Liver Function Tests: Recent Labs  Lab 03/19/19 1301  AST 29  ALT 46*  ALKPHOS 54  BILITOT 0.5  PROT 6.5  ALBUMIN 3.6   No results for input(s): LIPASE, AMYLASE in the last 168 hours. No results for input(s): AMMONIA in the last 168 hours. Coagulation Profile: No results for input(s): INR, PROTIME in the last 168 hours. Cardiac Enzymes: Recent Labs  Lab 03/19/19 1047  TROPONINI <0.03   BNP (last 3 results) No results for input(s): PROBNP in the last 8760 hours. HbA1C: No results for input(s): HGBA1C in the last 72 hours. CBG: No results for input(s): GLUCAP in the last 168 hours. Lipid Profile: No results for input(s): CHOL, HDL, LDLCALC, TRIG, CHOLHDL, LDLDIRECT in the last 72 hours. Thyroid Function Tests: No results for input(s): TSH, T4TOTAL, FREET4, T3FREE, THYROIDAB in the last 72 hours. Anemia Panel: Recent Labs    03/19/19 1301  FERRITIN 277   Urine analysis:     Component Value Date/Time   COLORURINE YELLOW 03/03/2019 1325   APPEARANCEUR CLEAR 03/03/2019 1325   LABSPEC <1.005 (L) 03/03/2019 1325   PHURINE 6.5 03/03/2019 1325   GLUCOSEU NEGATIVE 03/03/2019 1325   HGBUR NEGATIVE 03/03/2019 1325   BILIRUBINUR NEGATIVE 03/03/2019 1325   KETONESUR NEGATIVE 03/03/2019 1325   PROTEINUR NEGATIVE 03/03/2019 1325   NITRITE NEGATIVE 03/03/2019 1325   LEUKOCYTESUR NEGATIVE 03/03/2019 1325   Sepsis Labs: @LABRCNTIP (procalcitonin:4,lacticacidven:4)  ) Recent  Results (from the past 240 hour(s))  Respiratory Panel by PCR     Status: None   Collection Time: 03/19/19  6:41 PM  Result Value Ref Range Status   Adenovirus NOT DETECTED NOT DETECTED Final   Coronavirus 229E NOT DETECTED NOT DETECTED Final    Comment: (NOTE) The Coronavirus on the Respiratory Panel, DOES NOT test for the novel  Coronavirus (2019 nCoV)    Coronavirus HKU1 NOT DETECTED NOT DETECTED Final   Coronavirus NL63 NOT DETECTED NOT DETECTED Final   Coronavirus OC43 NOT DETECTED NOT DETECTED Final   Metapneumovirus NOT DETECTED NOT DETECTED Final   Rhinovirus / Enterovirus NOT DETECTED NOT DETECTED Final   Influenza A NOT DETECTED NOT DETECTED Final   Influenza B NOT DETECTED NOT DETECTED Final   Parainfluenza Virus 1 NOT DETECTED NOT DETECTED Final   Parainfluenza Virus 2 NOT DETECTED NOT DETECTED Final   Parainfluenza Virus 3 NOT DETECTED NOT DETECTED Final   Parainfluenza Virus 4 NOT DETECTED NOT DETECTED Final   Respiratory Syncytial Virus NOT DETECTED NOT DETECTED Final   Bordetella pertussis NOT DETECTED NOT DETECTED Final   Chlamydophila pneumoniae NOT DETECTED NOT DETECTED Final   Mycoplasma pneumoniae NOT DETECTED NOT DETECTED Final    Comment: Performed at [facility], [address], [place], [place] [address]         Radiology Studies: No results found.      Scheduled Meds: . apixaban  5 mg Oral BID  . atorvastatin  10 mg Oral QPC supper  .  diltiazem  20 mg Intravenous Once  . diltiazem  60 mg Oral Q6H  . fluticasone furoate-vilanterol  1 puff Inhalation Daily  . loratadine  10 mg Oral Daily  . omega-3 acid ethyl esters  1 g Oral QPC supper  . umeclidinium bromide  1 puff Inhalation Daily   Continuous Infusions: . diltiazem (CARDIZEM) infusion 10 mg/hr (03/21/19 1300)     LOS: 2 days    Time spent: 25 minutes   Edwin Dada, MD [facility] 03/21/2019, 3:25 PM     Please page through [place]:  [email or web address] Password [email or web address] If 7PM-7AM, please contact night-coverage     During this encounter: Patient Isolation: Droplet + Contact HCP PPE: Surgical mask, eye protection, gown, and gloves Patient PPE: None

## 2019-03-21 NOTE — Discharge Instructions (Signed)

## 2019-03-22 ENCOUNTER — Inpatient Hospital Stay (HOSPITAL_COMMUNITY): Payer: Medicare Other

## 2019-03-22 LAB — BASIC METABOLIC PANEL
Anion gap: 10 (ref 5–15)
BUN: 5 mg/dL — ABNORMAL LOW (ref 8–23)
CHLORIDE: 97 mmol/L — AB (ref 98–111)
CO2: 31 mmol/L (ref 22–32)
Calcium: 8.7 mg/dL — ABNORMAL LOW (ref 8.9–10.3)
Creatinine, Ser: 0.72 mg/dL (ref 0.61–1.24)
GFR calc Af Amer: >60 mL/min (ref >60)
GFR calc non Af Amer: >60 mL/min (ref >60)
Glucose, Bld: 110 mg/dL — ABNORMAL HIGH (ref 70–99)
Potassium: 3.6 mmol/L (ref 3.5–5.1)
Sodium: 138 mmol/L (ref 135–145)

## 2019-03-22 LAB — TSH: TSH: 2.539 u[IU]/mL (ref 0.350–4.500)

## 2019-03-22 LAB — TROPONIN I: Troponin I: <0.03 ng/mL (ref <0.03)

## 2019-03-22 MED ORDER — AZITHROMYCIN 250 MG PO TABS
250.0000 mg | ORAL_TABLET | Freq: Every day | ORAL | Status: DC
  Administered 2019-03-23 – 2019-03-25 (×3): 250 mg via ORAL
  Filled 2019-03-22 (×3): qty 1

## 2019-03-22 MED ORDER — DILTIAZEM HCL ER COATED BEADS 180 MG PO CP24
360.0000 mg | ORAL_CAPSULE | Freq: Every day | ORAL | Status: DC
  Administered 2019-03-23 – 2019-03-25 (×3): 360 mg via ORAL
  Filled 2019-03-22 (×3): qty 2

## 2019-03-22 MED ORDER — ALBUTEROL SULFATE HFA 108 (90 BASE) MCG/ACT IN AERS
1.0000 | INHALATION_SPRAY | Freq: Four times a day (QID) | RESPIRATORY_TRACT | Status: DC
  Administered 2019-03-22 (×2): 1 via RESPIRATORY_TRACT
  Filled 2019-03-22: qty 6.7

## 2019-03-22 MED ORDER — ALBUTEROL SULFATE HFA 108 (90 BASE) MCG/ACT IN AERS
1.0000 | INHALATION_SPRAY | Freq: Three times a day (TID) | RESPIRATORY_TRACT | Status: DC
  Administered 2019-03-23 (×2): 1 via RESPIRATORY_TRACT
  Filled 2019-03-22: qty 6.7

## 2019-03-22 MED ORDER — AZITHROMYCIN 250 MG PO TABS
500.0000 mg | ORAL_TABLET | Freq: Every day | ORAL | Status: AC
  Administered 2019-03-22: 500 mg via ORAL
  Filled 2019-03-22: qty 2

## 2019-03-22 MED ORDER — FUROSEMIDE 10 MG/ML IJ SOLN
40.0000 mg | Freq: Once | INTRAMUSCULAR | Status: AC
  Administered 2019-03-22: 40 mg via INTRAVENOUS
  Filled 2019-03-22: qty 4

## 2019-03-22 MED ORDER — METHYLPREDNISOLONE SODIUM SUCC 40 MG IJ SOLR
40.0000 mg | Freq: Every day | INTRAMUSCULAR | Status: DC
  Administered 2019-03-22 – 2019-03-23 (×2): 40 mg via INTRAVENOUS
  Filled 2019-03-22 (×2): qty 1

## 2019-03-22 NOTE — TOC Benefit Eligibility Note (Signed)
Transition of [facility]) Benefit Eligibility Note    Patient Details  Name: Paul [name] MRN: [id number] Date of Birth: [birth year]   Medication/Dose: Arne Cleveland  5 MG BID(APIXABAN : NON-FORMULARY)  Covered?: Yes  Tier: 3 Drug  Prescription Coverage Preferred Pharmacy: YES([facility]  90 DAY SUPPLY  FOR M/O $ 81.00)  Spoke with Person/Company/Phone Number:: DANIEL  ([facility] [place] [facility] #  [phone])  Co-Pay: $32.00(Q/L TWO PILL PER DAY)  Prior Approval: No  Deductible: Met       Memory Argue Phone Number: 03/22/2019, 10:41 AM

## 2019-03-22 NOTE — Progress Notes (Signed)
PROGRESS NOTE    Otoniel Myhand  UYQ:[id number] DOB: August 25, 1946 DOA: 03/19/2019 PCP: [name], MD      Brief Narrative:  Mr. Bolds is a 73 y.o. 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  Patient was in [place] until about a month ago, started to get SOB with exertion.  Seen in ER on 3/8, 3/11, 3/18; seen by Pulm 3/12.  Normal ECG on 3/11 and 18, CTA on 3/18 negative for PE or pneumonia.  Complained 3/11 of palpitations and palps with exertion.  Nebs and steroids rx'd several times with no improvement at all.  Now returns to ER having dyspnea on exertion.  No fever, chills, malaise, cough more than baseline.  No chest pressure but positive for palpitations/racing.    In ER, CXR clear.  Afebrile.  Tested for COVID and admitted for COPD exacerbation.  HRs in 110s.           Assessment & Plan:  New onset atrial fibrillation Admitted with HR 120s-140s.  ECG showed new onset Afib.  Troponin negative.  TSH normal.  This is a new Afib diagnosis.  CHA2DS2-VASc Score = 2 (age, PVD).   Started diltiazem, converted to sinus rhythm.  Converted to oral diltiazem.   Symptoms not improved after conversion to sinus. -Continue Eliquis -Stop aspirin -Continue diltiazem transition to 360 CD daily   Dyspnea, hypoxia Still persistently hypoxic with any ambulation.  CTA negative for PE 1 week ago, during this episode.  Also, currently anticoagulated, CTA would not change management.  Differential includes COPD and CHF and pneumonia  Also patient with notable orthopnea.  BNP normal and CXR without edema, but dyspnea on exertion and orthopnea -Obtain CT chest w/o contrast to eval lung parenchyma -Obtain repeat troponin -Obtain echo  COPD -Resume prednisone -Continue home inhalers -Resume azithromycin  Coronavirus rule out -Follow SARS-CoV-2 testing - Negative pressure room, droplet and contact precautions        MDM and disposition: The below labs and  imaging reports were reviewed and summarized above.  Medication management as above.  The patient was admitted with new onset atrial fibrillation, and hypoxia requiring supplemental oxygen that was new.  He was treated with diltiazem infusion and his rhythm converted to sinus rhythm.  However he still remains hypoxic and very out of breath much more than baseline.      We will obtain CT chest, echocardiogram, troponin and trial steroids and Lasix.  The patient is admitted with: A severe exacerbation of his chronic illness Anticoagulation that was initiated or actively managed   An extensive number of diagnoses or treatment options were managed. An extensive complexity of data was involved in decision making. Labs reviewed, pulse ox reviewed, radiology test ordered     DVT prophylaxis: N/A on eliquis Code Status: FULL Family Communication: Daughter by phone    Consultants:   None  Procedures:   None  Antimicrobials:   Ceftriaxone x1  Azithromycin x1    Subjective: No change in dyspnea over last 24 hours despite conversion to sinus rhythm.  No change in dyspnea, chest tightness, extreme shortness of breath.  Also no wheezing, cough, flulike symptoms, headache, confusion, fever.     Objective: Vitals:   03/22/19 0608 03/22/19 0843 03/22/19 1258 03/22/19 1605  BP: (!) 143/60 (!) 147/59 (!) 166/62 131/79  Pulse:  98 (!) 120 (!) 112  Resp:  (!) 22 (!) 24 16  Temp:  97.9 F (36.6 C) (!)  97.5 F (36.4 C) 97.8 F (36.6 C)  TempSrc:  Oral Oral Oral  SpO2:  98% 94% 95%  Weight:      Height:        Intake/Output Summary (Last 24 hours) at 03/22/2019 1628 Last data filed at 03/22/2019 1000 Gross per 24 hour  Intake --  Output 525 ml  Net -525 ml   Filed Weights   03/19/19 1028 03/19/19 2131  Weight: 65.3 kg 65.3 kg    Examination: General appearance: Well adult male, lying in bed, no acute distress, interactive. HEENT: Anicteric, conjunctival pink, lids and  lashes normal.  No nasal deformity, discharge or epistaxis.  Lips moist, edentulous, oropharynx moist, no oral lesions Skin: Skin warm and dry, without suspicious rashes or lesions. Cardiac: Tachycardic, regular, no murmurs appreciated.  Capillary refill, brisk, JVP normal, no lower extremity edema. Respiratory: Respiratory effort appears labored and tachypneic.  He has accessory muscle use, appears short of breath with speaking, diminished throughout Abdomen: Abdomen soft without tenderness to palpation or ascites. MSK: No deformities or effusions Neuro: Awake and alert, extraocular movements intact, moves all extremities, speech fluent    Psych: Sensorium intact and responding to questions, attention normal, affect normal, judgment and insight appear normal.   Data Reviewed: I have personally reviewed following labs and imaging studies:  CBC: Recent Labs  Lab 03/19/19 1056 03/19/19 1143 03/19/19 1434 03/19/19 2212 03/20/19 0607  WBC 12.7*  --   --  10.3 11.4*  NEUTROABS 12.1*  --   --   --   --   HGB 13.2 14.3 12.2* 12.3* 12.7*  HCT 43.0 42.0 36.0* 38.5* 42.1  MCV 82.2  --   --  81.4 82.2  PLT 349  --   --  336 932   Basic Metabolic Panel: Recent Labs  Lab 03/19/19 1056 03/19/19 1143 03/19/19 1434 03/19/19 2212 03/20/19 0607 03/21/19 0320 03/22/19 0621  NA 134* 134* 131*  --  139 136 138  K 4.1 4.0 4.2  --  3.7 3.8 3.6  CL 95*  --   --   --  99 96* 97*  CO2 30  --   --   --  31 30 31   GLUCOSE 124*  --   --   --  102* 128* 110*  BUN 9  --   --   --  8 8 5*  CREATININE 0.62  --   --  0.75 0.72 0.72 0.72  CALCIUM 9.1  --   --   --  8.7* 8.5* 8.7*  MG  --   --   --   --   --  2.0  --    GFR: Estimated Creatinine Clearance: 74.2 mL/min (by C-G formula based on SCr of 0.72 mg/dL). Liver Function Tests: Recent Labs  Lab 03/19/19 1301  AST 29  ALT 46*  ALKPHOS 54  BILITOT 0.5  PROT 6.5  ALBUMIN 3.6   No results for input(s): LIPASE, AMYLASE in the last 168  hours. No results for input(s): AMMONIA in the last 168 hours. Coagulation Profile: No results for input(s): INR, PROTIME in the last 168 hours. Cardiac Enzymes: Recent Labs  Lab 03/19/19 1047  TROPONINI <0.03   BNP (last 3 results) No results for input(s): PROBNP in the last 8760 hours. HbA1C: No results for input(s): HGBA1C in the last 72 hours. CBG: No results for input(s): GLUCAP in the last 168 hours. Lipid Profile: No results for input(s): CHOL, HDL, LDLCALC, TRIG, CHOLHDL, LDLDIRECT  in the last 72 hours. Thyroid Function Tests: Recent Labs    03/22/19 0621  TSH 2.539   Anemia Panel: No results for input(s): VITAMINB12, FOLATE, FERRITIN, TIBC, IRON, RETICCTPCT in the last 72 hours. Urine analysis:    Component Value Date/Time   COLORURINE YELLOW 03/03/2019 1325   APPEARANCEUR CLEAR 03/03/2019 1325   LABSPEC <1.005 (L) 03/03/2019 1325   PHURINE 6.5 03/03/2019 1325   GLUCOSEU NEGATIVE 03/03/2019 1325   HGBUR NEGATIVE 03/03/2019 1325   BILIRUBINUR NEGATIVE 03/03/2019 1325   KETONESUR NEGATIVE 03/03/2019 1325   PROTEINUR NEGATIVE 03/03/2019 1325   NITRITE NEGATIVE 03/03/2019 1325   LEUKOCYTESUR NEGATIVE 03/03/2019 1325   Sepsis Labs: @LABRCNTIP (procalcitonin:4,lacticacidven:4)  ) Recent Results (from the past 240 hour(s))  Respiratory Panel by PCR     Status: None   Collection Time: 03/19/19  6:41 PM  Result Value Ref Range Status   Adenovirus NOT DETECTED NOT DETECTED Final   Coronavirus 229E NOT DETECTED NOT DETECTED Final    Comment: (NOTE) The Coronavirus on the Respiratory Panel, DOES NOT test for the novel  Coronavirus (2019 nCoV)    Coronavirus HKU1 NOT DETECTED NOT DETECTED Final   Coronavirus NL63 NOT DETECTED NOT DETECTED Final   Coronavirus OC43 NOT DETECTED NOT DETECTED Final   Metapneumovirus NOT DETECTED NOT DETECTED Final   Rhinovirus / Enterovirus NOT DETECTED NOT DETECTED Final   Influenza A NOT DETECTED NOT DETECTED Final   Influenza B  NOT DETECTED NOT DETECTED Final   Parainfluenza Virus 1 NOT DETECTED NOT DETECTED Final   Parainfluenza Virus 2 NOT DETECTED NOT DETECTED Final   Parainfluenza Virus 3 NOT DETECTED NOT DETECTED Final   Parainfluenza Virus 4 NOT DETECTED NOT DETECTED Final   Respiratory Syncytial Virus NOT DETECTED NOT DETECTED Final   Bordetella pertussis NOT DETECTED NOT DETECTED Final   Chlamydophila pneumoniae NOT DETECTED NOT DETECTED Final   Mycoplasma pneumoniae NOT DETECTED NOT DETECTED Final    Comment: Performed at [facility], [place] [address] [place], [place] [address]         Radiology Studies: Ct Chest Wo Contrast  Result Date: 03/22/2019 CLINICAL DATA:  73 year old male with a history of COPD exacerbation, possible viral infection EXAM: CT CHEST WITHOUT CONTRAST TECHNIQUE: Multidetector CT imaging of the chest was performed following the standard protocol without IV contrast. COMPARISON:  03/10/2019, 12/07/2018 FINDINGS: Cardiovascular: Heart size within normal limits. No pericardial fluid/thickening. Calcifications of the left main, left anterior descending, circumflex, right coronary arteries. Unremarkable course caliber and contour of the thoracic aorta. Calcifications of the aortic arch. No periaortic fluid. Unremarkable diameter of the main pulmonary artery, without enlargement of the pulmonary arteries. Mediastinum/Nodes: No mediastinal adenopathy. Unremarkable appearance of the thoracic inlet. No supraclavicular or axillary adenopathy. Unremarkable course of the thoracic esophagus. Lungs/Pleura: Centrilobular and paraseptal emphysema. Pleuroparenchymal scarring at the bilateral lung apices. Nodule at the left apex image 23 of series 8, and the right upper lobe on image 58 of series 8. No endotracheal or endobronchial debris. No bronchial wall thickening. No confluent airspace disease. No nodular or ground-glass opacity. No pneumothorax or pleural effusion. Calcified granulomatous of the  right upper lobe. Upper Abdomen: No acute finding of the upper abdomen. Musculoskeletal: Irregularity of superior endplates of T4 and T5 are unchanged. No acute displaced fracture. IMPRESSION: No acute CT finding, with no CT evidence of pneumonia or bronchitis. Paraseptal and centrilobular emphysema. Redemonstration of small lung nodules. Follow-up chest CT is again recommended in 12 months. Aortic atherosclerosis and  coronary artery disease. Aortic Atherosclerosis (ICD10-I70.0). 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 Electronically Signed   By: Corrie Mckusick D.O.   On: 03/22/2019 15:49        Scheduled Meds:  apixaban  5 mg Oral BID   atorvastatin  10 mg Oral QPC supper   diltiazem  60 mg Oral Q6H   fluticasone furoate-vilanterol  1 puff Inhalation Daily   loratadine  10 mg Oral Daily   omega-3 acid ethyl esters  1 g Oral QPC supper   umeclidinium bromide  1 puff Inhalation Daily   Continuous Infusions:    LOS: 3 days    Time spent: 35 minutes   Edwin Dada, MD [facility] 03/22/2019, 4:28 PM     Please page through [place]:  [email or web address] Password [email or web address] If 7PM-7AM, please contact night-coverage     During this encounter: Patient Isolation: Droplet + Contact HCP PPE: Surgical mask, eye protection, gown, and gloves Patient PPE: None

## 2019-03-22 NOTE — Plan of Care (Signed)
  Problem: Education: Goal: Knowledge of General Education information will improve Description Including pain rating scale, medication(s)/side effects and non-pharmacologic comfort measures Outcome: Progressing   Problem: Health Behavior/Discharge Planning: Goal: Ability to manage health-related needs will improve Outcome: Progressing   Problem: Clinical Measurements: Goal: Ability to maintain clinical measurements within normal limits will improve Outcome: Progressing Goal: Respiratory complications will improve Outcome: Progressing On Room air to 2 LPM PRN. Tolerating well, has utilized albuterol inhaler x1 today for DOE when standing to urinate. Goal: Cardiovascular complication will be avoided Outcome: Progressing In NSR.

## 2019-03-23 DIAGNOSIS — J9611 Chronic respiratory failure with hypoxia: Secondary | ICD-10-CM

## 2019-03-23 DIAGNOSIS — I4819 Other persistent atrial fibrillation: Secondary | ICD-10-CM

## 2019-03-23 DIAGNOSIS — I2584 Coronary atherosclerosis due to calcified coronary lesion: Secondary | ICD-10-CM

## 2019-03-23 DIAGNOSIS — I2510 Atherosclerotic heart disease of native coronary artery without angina pectoris: Secondary | ICD-10-CM

## 2019-03-23 DIAGNOSIS — E7800 Pure hypercholesterolemia, unspecified: Secondary | ICD-10-CM

## 2019-03-23 DIAGNOSIS — I700 Atherosclerosis of aorta: Secondary | ICD-10-CM

## 2019-03-23 LAB — NOVEL CORONAVIRUS, NAA (HOSP ORDER, SEND-OUT TO REF LAB; TAT 18-24 HRS): SARS-CoV-2, NAA: NOT DETECTED

## 2019-03-23 LAB — BASIC METABOLIC PANEL
Anion gap: 8 (ref 5–15)
BUN: 8 mg/dL (ref 8–23)
CO2: 34 mmol/L — AB (ref 22–32)
Calcium: 8.6 mg/dL — ABNORMAL LOW (ref 8.9–10.3)
Chloride: 95 mmol/L — ABNORMAL LOW (ref 98–111)
Creatinine, Ser: 0.85 mg/dL (ref 0.61–1.24)
GFR calc Af Amer: >60 mL/min (ref >60)
GFR calc non Af Amer: >60 mL/min (ref >60)
Glucose, Bld: 103 mg/dL — ABNORMAL HIGH (ref 70–99)
Potassium: 3.5 mmol/L (ref 3.5–5.1)
Sodium: 137 mmol/L (ref 135–145)

## 2019-03-23 MED ORDER — METHYLPREDNISOLONE SODIUM SUCC 40 MG IJ SOLR
40.0000 mg | Freq: Three times a day (TID) | INTRAMUSCULAR | Status: DC
  Administered 2019-03-23 – 2019-03-25 (×5): 40 mg via INTRAVENOUS
  Filled 2019-03-23 (×5): qty 1

## 2019-03-23 MED ORDER — ATORVASTATIN CALCIUM 40 MG PO TABS
40.0000 mg | ORAL_TABLET | Freq: Every day | ORAL | Status: DC
  Administered 2019-03-23 – 2019-03-24 (×2): 40 mg via ORAL
  Filled 2019-03-23 (×2): qty 1

## 2019-03-23 MED ORDER — ALBUTEROL SULFATE (2.5 MG/3ML) 0.083% IN NEBU
2.5000 mg | INHALATION_SOLUTION | Freq: Four times a day (QID) | RESPIRATORY_TRACT | Status: DC
  Administered 2019-03-23: 2.5 mg via RESPIRATORY_TRACT
  Filled 2019-03-23: qty 3

## 2019-03-23 MED ORDER — ALBUTEROL SULFATE (2.5 MG/3ML) 0.083% IN NEBU
2.5000 mg | INHALATION_SOLUTION | Freq: Three times a day (TID) | RESPIRATORY_TRACT | Status: DC
  Administered 2019-03-24 – 2019-03-25 (×4): 2.5 mg via RESPIRATORY_TRACT
  Filled 2019-03-23 (×4): qty 3

## 2019-03-23 MED ORDER — FUROSEMIDE 10 MG/ML IJ SOLN
40.0000 mg | Freq: Once | INTRAMUSCULAR | Status: AC
  Administered 2019-03-23: 40 mg via INTRAVENOUS

## 2019-03-23 MED ORDER — PANTOPRAZOLE SODIUM 40 MG PO TBEC
40.0000 mg | DELAYED_RELEASE_TABLET | Freq: Every day | ORAL | Status: DC
  Administered 2019-03-23 – 2019-03-25 (×3): 40 mg via ORAL
  Filled 2019-03-23 (×3): qty 1

## 2019-03-23 MED ORDER — FUROSEMIDE 10 MG/ML IJ SOLN
INTRAMUSCULAR | Status: AC
  Filled 2019-03-23: qty 4

## 2019-03-23 MED ORDER — ALBUTEROL SULFATE (2.5 MG/3ML) 0.083% IN NEBU: 2.5000 mg | INHALATION_SOLUTION | RESPIRATORY_TRACT | Status: DC | PRN

## 2019-03-23 NOTE — Care Management Important Message (Signed)
Important Message  Patient Details  Name: Paul [name] MRN: [id number] Date of Birth: [birth year]   Medicare Important Message Given:  Yes    Zenon Mayo, RN 03/23/2019, 12:08 PM

## 2019-03-23 NOTE — Progress Notes (Signed)
PROGRESS NOTE    Paul [name]  UUV:[id number] DOB: [birth year] DOA: 03/19/2019 PCP: [name], MD      Brief Narrative:  Paul [name] is a 73 y.o. 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  Patient was in [place] until about a month ago, started to get SOB with exertion.  Seen in ER on 3/8, 3/11, 3/18; seen by Pulm 3/12.  Normal ECG on 3/11 and 18, CTA on 3/18 negative for PE or pneumonia.  Complained 3/11 of palpitations and palps with exertion.  Nebs and steroids rx'd several times with no improvement at all.  Now returns to ER having dyspnea on exertion.  No fever, chills, malaise, cough more than baseline.  No chest pressure but positive for palpitations/racing.    In ER, CXR clear.  Afebrile.  Tested for COVID and admitted for COPD exacerbation.  HRs in 110s.           Assessment & Plan:  New onset atrial fibrillation Admitted with HR 120s-140s.  ECG showed new onset Afib.  Troponin negative.  TSH normal.  This is a new Afib diagnosis.  CHA2DS2-VASc Score = 2 (age, PVD).   Started diltiazem, converted to sinus rhythm.  Converted to oral diltiazem.   Symptoms not improved after conversion to sinus. -Continue Eliquis new -Stop home aspirin -Continue diltiazem     Hypoxic respiratory failure COPD exacerbation Progression of severe COPD Chief presenting symptom was dyspnea on exertion and new hypoxia with exertion.   At baseline, FEV1 29% in 2017, follows with Dr. Vaughan Browner, uses O2 only at night.  Now with one month symptoms, refractory to two prednisone tapers as an outpatient.  New hypoxia with any exertion.    There was some initial thought that his dyspnea on exertion was all Afib related, although it did not improve at all with spontaneous conversion to sinus rhythm.  There was consideration of acute diastolic CHF, but (as per Dr. Marlou Porch note), there are no objective data to support it, and he did not improve at all with two doses of empiric  Lasix.  PE was considered, but he had CTA chest 2 weeks ago without PE, he is hemodynamically stable and without syncope, and is on systemic anticagulation for new onset Afib.  CT chest obtained yesterday shows no pneumonia, no effusions or edema, no pneumothorax.  I now suspect this is partly COPD exacerbation and partly just progression of his chronic respiratory failure to chronic hypoxic respiratory failure.  Discussed with Pulmonology by phone; they recommend augmenting steroids to Solu-medrol TID for 48 hours and increasing respiratory therapies.  If poor improvement, d/c with new O2 and follow up with Dr. Vaughan Browner.    Negative CoV result today makes coronavirus infection very unlikely, and so we will change to frequent nebulized bronchilators.  -Increase Solu-Medrol 40 to TID  -Albuterol neb q6hrs and prn -Continue azithromycin    Coronavirus ruled out Patient with no symptoms of fever, myalgias, malaise at any time.  Low suspicion for coronavirus infection and SARS-CoV-2 testing NEGATIVE.   -Discontinue isolation precautions        MDM and disposition: The below labs and imaging reports were reviewed and summarized above.  Medication management as above.  Patient was admitted with new onset atrial fibrillation, and new hypoxia requiring supplemental oxygen that was new.  He was treated with diltiazem infusion and his rhythm converted to sinus rhythm.  However he still remained hypoxic and extremely out of  breath with minimal exertion.  At this point given CT chest results, cardiology input, and pulmonology input as outlined above.    Will treat with TID Solu-medrol.  Monitor clinical improvement.  PT eval.  Patient aware that if this is all just progression of COPD, may not improve.  Likely will need supplemental O2 24/7 at d/c.    The patient is admitted with: A severe exacerbation of his chronic illness Anticoagulation that was initiated or actively managed   An extensive  number of diagnoses or treatment options were managed. An extensive complexity of data was involved in decision making. Labs reviewed, pulse ox reviewed, radiology test ordered     DVT prophylaxis: N/A on eliquis Code Status: FULL Family Communication: Daughter by phone    Consultants:   Cardiology  Pulmonology (by phone)  Procedures:   None  Antimicrobials:   Ceftriaxone x1  Azithromycin 3/30 >>   Subjective: Dyspnea is unchanged.  No change in chest tightness, shortness of breath, wheezing, cough.  No flulike symptoms, headache, malaise.  No fever.  No vomiting or syncope.  No chest pain..     Objective: Vitals:   03/22/19 2055 03/23/19 0751 03/23/19 0922 03/23/19 1203  BP: 135/71  (!) 142/64 125/77  Pulse: (!) 108  (!) 103 (!) 112  Resp: (!) 22  (!) 24 (!) 32  Temp: 98.2 F (36.8 C)  98.3 F (36.8 C) 97.8 F (36.6 C)  TempSrc: Oral  Oral Oral  SpO2: (!) 88% 95% 94% 93%  Weight:      Height:        Intake/Output Summary (Last 24 hours) at 03/23/2019 1506 Last data filed at 03/23/2019 1259 Gross per 24 hour  Intake --  Output 1300 ml  Net -1300 ml   Filed Weights   03/19/19 1028 03/19/19 2131  Weight: 65.3 kg 65.3 kg    Examination: General appearance: Elderly adult male, lying in bed, no acute distress, interactive. HEENT: Anicteric, conjunctival pink, lids and lashes normal.  No nasal deformity, discharge, or epistaxis.  Lips moist, edentulous, oropharynx moist, no oral lesions. Skin: Skin warm and dry, without suspicious rashes or lesions. Cardiac: Tachycardic, regular, no murmurs appreciated.  Capillary refill brisk.  JVP normal, no lower extremity edema. Respiratory: Paul [name] effort appears somewhat labored with accessory muscle use.  He is somewhat short of breath speaking.  Lung sounds diminished bilaterally.  His lungs have been extremely tight earlier in the week, better air movement now, and expiratory wheezing noted. Abdomen: Abdomen soft  without tenderness to palpation or ascites. MSK: No deformities or effusions Neuro: Awake and alert, extraocular movements intact, moves all extremities, speech fluent. Psych: Sensorium intact responding to questions, attention normal, affect normal, judgment and insight appear normal.   Data Reviewed: I have personally reviewed following labs and imaging studies:  CBC: Recent Labs  Lab 03/19/19 1056 03/19/19 1143 03/19/19 1434 03/19/19 2212 03/20/19 0607  WBC 12.7*  --   --  10.3 11.4*  NEUTROABS 12.1*  --   --   --   --   HGB 13.2 14.3 12.2* 12.3* 12.7*  HCT 43.0 42.0 36.0* 38.5* 42.1  MCV 82.2  --   --  81.4 82.2  PLT 349  --   --  336 924   Basic Metabolic Panel: Recent Labs  Lab 03/19/19 1056  03/19/19 1434 03/19/19 2212 03/20/19 0607 03/21/19 0320 03/22/19 0621 03/23/19 0401  NA 134*   < > 131*  --  139 136 138 137  K 4.1   < > 4.2  --  3.7 3.8 3.6 3.5  CL 95*  --   --   --  99 96* 97* 95*  CO2 30  --   --   --  31 30 31  34*  GLUCOSE 124*  --   --   --  102* 128* 110* 103*  BUN 9  --   --   --  8 8 5* 8  CREATININE 0.62  --   --  0.75 0.72 0.72 0.72 0.85  CALCIUM 9.1  --   --   --  8.7* 8.5* 8.7* 8.6*  MG  --   --   --   --   --  2.0  --   --    < > = values in this interval not displayed.   GFR: Estimated Creatinine Clearance: 69.8 mL/min (by C-G formula based on SCr of 0.85 mg/dL). Liver Function Tests: Recent Labs  Lab 03/19/19 1301  AST 29  ALT 46*  ALKPHOS 54  BILITOT 0.5  PROT 6.5  ALBUMIN 3.6   No results for input(s): LIPASE, AMYLASE in the last 168 hours. No results for input(s): AMMONIA in the last 168 hours. Coagulation Profile: No results for input(s): INR, PROTIME in the last 168 hours. Cardiac Enzymes: Recent Labs  Lab 03/19/19 1047 03/22/19 1537  TROPONINI <0.03 <0.03   BNP (last 3 results) No results for input(s): PROBNP in the last 8760 hours. HbA1C: No results for input(s): HGBA1C in the last 72 hours. CBG: No results  for input(s): GLUCAP in the last 168 hours. Lipid Profile: No results for input(s): CHOL, HDL, LDLCALC, TRIG, CHOLHDL, LDLDIRECT in the last 72 hours. Thyroid Function Tests: Recent Labs    03/22/19 0621  TSH 2.539   Anemia Panel: No results for input(s): VITAMINB12, FOLATE, FERRITIN, TIBC, IRON, RETICCTPCT in the last 72 hours. Urine analysis:    Component Value Date/Time   COLORURINE YELLOW 03/03/2019 1325   APPEARANCEUR CLEAR 03/03/2019 1325   LABSPEC <1.005 (L) 03/03/2019 1325   PHURINE 6.5 03/03/2019 1325   GLUCOSEU NEGATIVE 03/03/2019 1325   HGBUR NEGATIVE 03/03/2019 1325   BILIRUBINUR NEGATIVE 03/03/2019 1325   KETONESUR NEGATIVE 03/03/2019 1325   PROTEINUR NEGATIVE 03/03/2019 1325   NITRITE NEGATIVE 03/03/2019 1325   LEUKOCYTESUR NEGATIVE 03/03/2019 1325   Sepsis Labs: @LABRCNTIP (procalcitonin:4,lacticacidven:4)  ) Recent Results (from the past 240 hour(s))  Respiratory Panel by PCR     Status: None   Collection Time: 03/19/19  6:41 PM  Result Value Ref Range Status   Adenovirus NOT DETECTED NOT DETECTED Final   Coronavirus 229E NOT DETECTED NOT DETECTED Final    Comment: (NOTE) The Coronavirus on the Respiratory Panel, DOES NOT test for the novel  Coronavirus (2019 nCoV)    Coronavirus HKU1 NOT DETECTED NOT DETECTED Final   Coronavirus NL63 NOT DETECTED NOT DETECTED Final   Coronavirus OC43 NOT DETECTED NOT DETECTED Final   Metapneumovirus NOT DETECTED NOT DETECTED Final   Rhinovirus / Enterovirus NOT DETECTED NOT DETECTED Final   Influenza A NOT DETECTED NOT DETECTED Final   Influenza B NOT DETECTED NOT DETECTED Final   Parainfluenza Virus 1 NOT DETECTED NOT DETECTED Final   Parainfluenza Virus 2 NOT DETECTED NOT DETECTED Final   Parainfluenza Virus 3 NOT DETECTED NOT DETECTED Final   Parainfluenza Virus 4 NOT DETECTED NOT DETECTED Final   Respiratory Syncytial Virus NOT DETECTED NOT DETECTED Final   Bordetella pertussis NOT DETECTED NOT DETECTED Final  Chlamydophila pneumoniae NOT DETECTED NOT DETECTED Final   Mycoplasma pneumoniae NOT DETECTED NOT DETECTED Final    Comment: Performed at [facility], [place] [address] [place], [place] [address]  Novel Coronavirus, NAA (hospital order; send-out to ref lab)     Status: None   Collection Time: 03/19/19  9:30 PM  Result Value Ref Range Status   SARS-CoV-2, NAA NOT DETECTED NOT DETECTED Final    Comment: (NOTE) This test was developed and its performance characteristics determined by [employer]. This test has not been FDA cleared or approved. This test has been authorized by FDA under an Emergency Use Authorization (EUA). This test has been validated in accordance with the FDA's Guidance Document (Policy for [place] in Laboratories Certified to Perform High Complexity Testing under CLIA prior to Emergency Use Authorization for Coronavirus [id number] during the [facility] Emergency) issued on February 29th, 2020. FDA independent review of this validation is pending. This test is only authorized for the duration of time the declaration that circumstances exist justifying the authorization of the emergency use of in vitro diagnostic tests for detection of SARS-CoV- 2 virus and/or diagnosis of COVID-19 infection under section 564(b)(1) of the Act, 21 U.S.C. 790WIO-9(B)(3), unless the authorization is terminated or revoked sooner. Performed At: [facility] [address] [place], [place] [id number] Rush Farmer MD ST:[id number]    [place]  Final    Comment: Performed at [facility], [place] [address], [place], [place] [address]         Radiology Studies: Ct Chest Wo Contrast  Result Date: 03/22/2019 CLINICAL DATA:  73 year old male with a history of COPD exacerbation, possible viral infection EXAM: CT CHEST WITHOUT CONTRAST TECHNIQUE: Multidetector CT imaging of the chest was performed following the standard  protocol without IV contrast. COMPARISON:  03/10/2019, 12/07/2018 FINDINGS: Cardiovascular: Heart size within normal limits. No pericardial fluid/thickening. Calcifications of the left main, left anterior descending, circumflex, right coronary arteries. Unremarkable course caliber and contour of the thoracic aorta. Calcifications of the aortic arch. No periaortic fluid. Unremarkable diameter of the main pulmonary artery, without enlargement of the pulmonary arteries. Mediastinum/Nodes: No mediastinal adenopathy. Unremarkable appearance of the thoracic inlet. No supraclavicular or axillary adenopathy. Unremarkable course of the thoracic esophagus. Lungs/Pleura: Centrilobular and paraseptal emphysema. Pleuroparenchymal scarring at the bilateral lung apices. Nodule at the left apex image 23 of series 8, and the right upper lobe on image 58 of series 8. No endotracheal or endobronchial debris. No bronchial wall thickening. No confluent airspace disease. No nodular or ground-glass opacity. No pneumothorax or pleural effusion. Calcified granulomatous of the right upper lobe. Upper Abdomen: No acute finding of the upper abdomen. Musculoskeletal: Irregularity of superior endplates of T4 and T5 are unchanged. No acute displaced fracture. IMPRESSION: No acute CT finding, with no CT evidence of pneumonia or bronchitis. Paraseptal and centrilobular emphysema. Redemonstration of small lung nodules. Follow-up chest CT is again recommended in 12 months. Aortic atherosclerosis and coronary artery disease. Aortic Atherosclerosis (ICD10-I70.0). 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 Electronically Signed   By: Corrie Mckusick D.O.   On: 03/22/2019 15:49        Scheduled Meds:  albuterol  2.5 mg Nebulization Q6H   apixaban  5 mg Oral BID   atorvastatin  40 mg Oral QPC supper   azithromycin  250 mg Oral Daily   diltiazem  360 mg Oral Daily   fluticasone furoate-vilanterol  1 puff Inhalation Daily  loratadine  10 mg Oral Daily   methylPREDNISolone (SOLU-MEDROL) injection  40 mg Intravenous Q8H   omega-3 acid ethyl esters  1 g Oral QPC supper   umeclidinium bromide  1 puff Inhalation Daily   Continuous Infusions:    LOS: 4 days    Time spent: 35 minutes   Edwin Dada, MD [facility] 03/23/2019, 3:06 PM     Please page through [place]:  [email or web address] Password [email or web address] If 7PM-7AM, please contact night-coverage     During this encounter: Patient Isolation: Droplet + Contact HCP PPE: Surgical mask, eye protection, gown, and gloves Patient PPE: None

## 2019-03-23 NOTE — Consult Note (Signed)
Cardiology Consultation:   Patient ID: Paul [name] MRN: [id number]; DOB: [birth year]  Admit date: 03/19/2019 Date of Consult: 03/23/2019  Primary Care Provider: [name], MD Primary Cardiologist: Candee Furbish, MD  Primary Electrophysiologist:  None    Patient Profile:   Paul [name] is a 73 y.o. 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 Loleta Books.  This interaction took place via telehealth.  Unable to provide video interaction however telephone conversation took place.  Patient understood the situation with coronavirus and agreed to interaction.  Total time spent direct audio- 20 minutes.  Chart review-additional 20 minutes.  Time spent with discussion with Dr. Luiz Ochoa minutes.  History of Present Illness:   Paul [name] is a 73 year old male, (wife I take care of) with severe COPD followed by pulmonary, FEV1 of 29% here with 1 month worsening of shortness of breath.  Upon arrival in the ER, he was found to be in atrial fibrillation with heart rates were approximately 110 -120 bpm.  Currently, heart rate is approximately 103 bpm on diltiazem 360 once a day.  He has had multiple ER visits this month as well as pulmonary clinic visit.  EKG most recently on the 18th was normal sinus rhythm.  This EKG currently shows atrial fibrillation.  He states that with minimal exertion, getting up from bed going to the bathroom for instance, he has shortness of breath.  He continued to tell me however his oxygen saturations have been "okay ".  Denies any chest pain, syncope.  When asked if his breathing was worse with laying down versus sitting up, he stated that not really, it is just mostly when trying to initiate any activity such as getting up out of bed he become short of breath.  Obviously this is frustrating for him.  He denies any early family history of coronary artery disease.  Prior studies  personally reviewed include CT scan of chest which demonstrates triple-vessel coronary artery calcification.  No pulmonary embolism.  Denies any recent bleeding syncope fevers chills.  Note, BNP 38.  Troponin is normal.  Past Medical History:  Diagnosis Date  . COPD (chronic obstructive pulmonary disease) ([place])   . High cholesterol     Past Surgical History:  Procedure Laterality Date  . CATARACT EXTRACTION Right   . SKIN SURGERY     shoulders and chest     Home Medications:  Prior to Admission medications   Medication Sig Start Date End Date Taking? Authorizing Provider  albuterol (PROAIR HFA) 108 (90 Base) MCG/ACT inhaler Inhale 2 puffs into the lungs every 6 (six) hours as needed for wheezing or shortness of breath.   Yes [provider]  albuterol (PROVENTIL) (2.5 MG/3ML) 0.083% nebulizer solution Take 3 mLs (2.5 mg total) by nebulization every 6 (six) hours as needed for wheezing or shortness of breath. Patient taking differently: Take 2.5 mg by nebulization every 3 (three) hours.  02/28/19  Yes [name], PA-C  aspirin EC 81 MG tablet Take 81 mg by mouth daily after supper.   Yes [provider]  budesonide-formoterol (SYMBICORT) 160-4.5 MCG/ACT inhaler Inhale 2 puffs into the lungs 2 (two) times daily. 03/04/19  Yes Fenton Foy, NP  doxycycline (VIBRAMYCIN) 100 MG capsule Take 1 capsule (100 mg total) by mouth 2 (two) times daily. Patient taking differently: Take 100 mg by mouth 2 (two) times daily. 10 day course ordered 03/10/19 03/10/19  Yes Gareth Morgan, MD  fish oil-omega-3 fatty acids 1000 MG capsule Take 1 g by mouth daily after supper.    Yes [provider]  loratadine (CLARITIN) 10 MG tablet Take 10 mg by mouth daily.   Yes [provider]  OXYGEN Inhale 2 L into the lungs continuous.   Yes [provider]  predniSONE (DELTASONE) 10 MG tablet Take 60mg  for 3 days, 50mg  for 3 days, 40mg  for 3 days, 30mg  for 3  days, 20mg  for 3 days, 10mg  for 3 days and stop Patient taking differently: Take 10-60 mg by mouth See admin instructions. Tapered course ordered 03/16/19: Take 6 tablets (60mg ) by mouth daily for 3 days, then take 5 tablets (50mg ) daily for 3 days, then take 4 tablets (40mg ) daily for 3 days, then take 3 tablets (30mg ) daily for 3 days, then take 2 tablets (20mg ) daily for 3 days, then take 1 tablet (10mg ) daily for 3 days, then stop 03/16/19  Yes Mannam, Praveen, MD  simvastatin (ZOCOR) 40 MG tablet Take 20 mg by mouth daily after supper.    Yes [provider]  Tiotropium Bromide Monohydrate (SPIRIVA RESPIMAT) 2.5 MCG/ACT AERS Inhale 2 puffs into the lungs daily. Patient taking differently: Inhale 2 puffs into the lungs daily at 3 pm.  03/04/19  Yes Fenton Foy, NP  benzonatate (TESSALON) 100 MG capsule Take 1 capsule (100 mg total) by mouth every 8 (eight) hours. Patient not taking: Reported on 03/19/2019 02/28/19   [name], PA-C  budesonide-formoterol [facility]) 160-4.5 MCG/ACT inhaler Inhale 2 puffs into the lungs 2 (two) times daily. Patient not taking: Reported on 03/19/2019 12/09/18   [name], MD  predniSONE (DELTASONE) 10 MG tablet Take 4 tablets daily for 4 days, 3 tablets daily for 3 days, 2 tablets daily for 2 days, 1 tablet daily for 2 days. Patient not taking: Reported on 03/19/2019 03/10/19   Gareth Morgan, MD  Respiratory Therapy Supplies (FLUTTER) DEVI Use 2-3 times daily 07/04/15   Elsie Stain, MD    Inpatient Medications: Scheduled Meds: . albuterol  1 puff Inhalation TID  . apixaban  5 mg Oral BID  . atorvastatin  10 mg Oral QPC supper  . azithromycin  250 mg Oral Daily  . diltiazem  360 mg Oral Daily  . fluticasone furoate-vilanterol  1 puff Inhalation Daily  . furosemide  40 mg Intravenous Once  . loratadine  10 mg Oral Daily  . methylPREDNISolone (SOLU-MEDROL) injection  40 mg Intravenous Daily  . omega-3 acid ethyl esters  1 g Oral QPC  supper  . umeclidinium bromide  1 puff Inhalation Daily   Continuous Infusions:  PRN Meds: acetaminophen **OR** acetaminophen, albuterol, hydrALAZINE, Melatonin, ondansetron **OR** ondansetron (ZOFRAN) IV  Allergies:   No Known Allergies  Social History:   Social History   Socioeconomic History  . Marital status: Married    Spouse name: Not on file  . Number of children: 3  . Years of education: Not on file  . Highest education level: Not on file  Occupational History  . Occupation: retired    Comment: truck Diplomatic Services operational officer  . Financial resource strain: Not on file  . Food insecurity:    Worry: Not on file    Inability: Not on file  . Transportation needs:    Medical: Not on file    Non-medical: Not on file  Tobacco Use  . Smoking status: Former Smoker    Packs/day: 2.00  Years: 52.00    Pack years: 104.00    Types: Cigarettes    Last attempt to quit: 02/03/2009    Years since quitting: 10.1  . Smokeless tobacco: Never Used  . Tobacco comment: Counseled to remain smoke free  Substance and Sexual Activity  . Alcohol use: Yes    Alcohol/week: 0.0 standard drinks    Comment: occ  . Drug use: No  . Sexual activity: Not on file  Lifestyle  . Physical activity:    Days per week: Not on file    Minutes per session: Not on file  . Stress: Not on file  Relationships  . Social connections:    Talks on phone: Not on file    Gets together: Not on file    Attends religious service: Not on file    Active member of club or organization: Not on file    Attends meetings of clubs or organizations: Not on file    Relationship status: Not on file  . Intimate partner violence:    Fear of current or ex partner: Not on file    Emotionally abused: Not on file    Physically abused: Not on file    Forced sexual activity: Not on file  Other Topics Concern  . Not on file  Social History Narrative   Originally from [place]. Previously worked driving a Restaurant manager, fast food. No  pets currently. No mold exposure. 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     Family History:    Family History  Problem Relation Age of Onset  . Heart disease Brother   . Heart disease Mother   . Heart disease Father   . Cancer Paternal Uncle      ROS:  Please see the history of present illness.   All other ROS reviewed and negative.     Physical Exam/Data:   Vitals:   03/22/19 1944 03/22/19 2055 03/23/19 0751 03/23/19 0922  BP:  135/71  (!) 142/64  Pulse:  (!) 108  (!) 103  Resp:  (!) 22  (!) 24  Temp:  98.2 F (36.8 C)  98.3 F (36.8 C)  TempSrc:  Oral  Oral  SpO2: 91% (!) 88% 95% 94%  Weight:      Height:        Intake/Output Summary (Last 24 hours) at 03/23/2019 1141 Last data filed at 03/22/2019 2300 Gross per 24 hour  Intake -  Output 950 ml  Net -950 ml   Last 3 Weights 03/19/2019 03/19/2019 03/10/2019  Weight (lbs) 143 lb 15.4 oz 143 lb 15.4 oz 144 lb  Weight (kg) 65.3 kg 65.3 kg 65.318 kg     Body mass index is 23.24 kg/m.   While speaking to him via telephone, he was able to complete short sentences without difficulty.  Was alert and oriented x3.   EKG:  The EKG was personally reviewed and demonstrates: Atrial fibrillation heart rate 110, no ischemic changes Prior EKG demonstrated sinus rhythm.   Relevant CV Studies:  Echocardiogram has not been performed. CT scan of chest shows multivessel coronary artery calcification as well as aortic atherosclerosis.  Emphysema noted.  Laboratory Data:  Chemistry Recent Labs  Lab 03/21/19 0320 03/22/19 0621 03/23/19 0401  NA 136 138 137  K 3.8 3.6 3.5  CL 96* 97* 95*  CO2 30 31 34*  GLUCOSE 128* 110* 103*  BUN 8 5* 8  CREATININE 0.72 0.72  0.85  CALCIUM 8.5* 8.7* 8.6*  GFRNONAA >60 >60 >60  GFRAA >60 >60 >60  ANIONGAP 10 10 8     Recent Labs  Lab 03/19/19 1301  PROT 6.5  ALBUMIN 3.6  AST 29  ALT 46*  ALKPHOS 54  BILITOT 0.5    Hematology Recent Labs  Lab 03/19/19 1056  03/19/19 1434 03/19/19 2212 03/20/19 0607  WBC 12.7*  --   --  10.3 11.4*  RBC 5.23  --   --  4.73 5.12  HGB 13.2   < > 12.2* 12.3* 12.7*  HCT 43.0   < > 36.0* 38.5* 42.1  MCV 82.2  --   --  81.4 82.2  MCH 25.2*  --   --  26.0 24.8*  MCHC 30.7  --   --  31.9 30.2  RDW 14.2  --   --  14.2 14.4  PLT 349  --   --  336 331   < > = values in this interval not displayed.   Cardiac Enzymes Recent Labs  Lab 03/19/19 1047 03/22/19 1537  TROPONINI <0.03 <0.03   No results for input(s): TROPIPOC in the last 168 hours.  BNP Recent Labs  Lab 03/19/19 1056  BNP 38.2    DDimer No results for input(s): DDIMER in the last 168 hours.  Radiology/Studies:  Ct Chest Wo Contrast  Result Date: 03/22/2019 CLINICAL DATA:  73 year old male with a history of COPD exacerbation, possible viral infection EXAM: CT CHEST WITHOUT CONTRAST TECHNIQUE: Multidetector CT imaging of the chest was performed following the standard protocol without IV contrast. COMPARISON:  03/10/2019, 12/07/2018 FINDINGS: Cardiovascular: Heart size within normal limits. No pericardial fluid/thickening. Calcifications of the left main, left anterior descending, circumflex, right coronary arteries. Unremarkable course caliber and contour of the thoracic aorta. Calcifications of the aortic arch. No periaortic fluid. Unremarkable diameter of the main pulmonary artery, without enlargement of the pulmonary arteries. Mediastinum/Nodes: No mediastinal adenopathy. Unremarkable appearance of the thoracic inlet. No supraclavicular or axillary adenopathy. Unremarkable course of the thoracic esophagus. Lungs/Pleura: Centrilobular and paraseptal emphysema. Pleuroparenchymal scarring at the bilateral lung apices. Nodule at the left apex image 23 of series 8, and the right upper lobe on image 58 of series 8. No endotracheal or endobronchial debris. No bronchial wall thickening. No confluent airspace disease.  No nodular or ground-glass opacity. No pneumothorax or pleural effusion. Calcified granulomatous of the right upper lobe. Upper Abdomen: No acute finding of the upper abdomen. Musculoskeletal: Irregularity of superior endplates of T4 and T5 are unchanged. No acute displaced fracture. IMPRESSION: No acute CT finding, with no CT evidence of pneumonia or bronchitis. Paraseptal and centrilobular emphysema. Redemonstration of small lung nodules. Follow-up chest CT is again recommended in 12 months. Aortic atherosclerosis and coronary artery disease. Aortic Atherosclerosis (ICD10-I70.0). 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 Electronically Signed   By: Corrie Mckusick D.O.   On: 03/22/2019 15:49   Dg Chest Port 1 View  Result Date: 03/19/2019 CLINICAL DATA:  73 year old male with shortness of breath EXAM: PORTABLE CHEST 1 VIEW COMPARISON:  None. FINDINGS: Cardiomediastinal silhouette unchanged in size and contour. Calcifications of the aortic arch. Hyperinflation, similar to prior studies. No confluent airspace disease. No pleural effusion or pneumothorax. No displaced fracture. IMPRESSION: Emphysema, with no evidence of superimposed acute cardiopulmonary disease. Electronically Signed   By: Corrie Mckusick D.O.   On: 03/19/2019 11:44    Assessment and Plan:   New onset atrial fibrillation, persistent - No prior history of this.  It is likely that this is been precipitated by his underlying worsening emphysema.  Certainly loss of atrial kick may be contributing to some of his shortness of breath however his overall rate control at this point with diltiazem 360 once a day long-acting seems adequate.  Agree with utilization of calcium channel blocker given his underlying COPD.  Would try to avoid amiodarone.  Digoxin could be considered if further rate control was necessary however rate control currently seems adequate. - Also agree with Eliquis 5 mg twice a day for anticoagulation for stroke  prevention.  Chads vasc score at least 2 for age and aortic atherosclerosis. - After 3 weeks of anticoagulation, we could consider cardioversion if he has not already auto converted.  Now, I would like to try to avoid TEE, aerosolized and procedure, unless absolutely necessary from a clinical standpoint.  Dyspnea on exertion/severe COPD - Reassuring BNP, normal. - Cardiac size on CT scan and chest x-ray is also normal.  No signs of dilated cardiomyopathy for instance. - Certainly loss of atrial kick in the setting of atrial fibrillation may be potentiating some of his shortness of breath but suspicion is that his severe underlying COPD, lung disease, FEV1 of 29%, is largely the driver in his current symptomatology. - Since he is currently on coronavirus rule out protocol, and objective findings as above are currently negative, I would hold off on echocardiogram at this time to minimize exposure. - I do see that Lasix 40 mg was empirically given IV x1.  This seems reasonable to see if this assists in any way with his shortness of breath.  I think we should overall however continue with good rate control of his atrial fibrillation and continue to treat his underlying COPD.  Agree that chest x-ray did not have any significant findings of pulmonary edema.  Coronary artery calcification -CT scan personally reviewed which shows triple-vessel coronary artery calcification/atherosclerosis.  I will change his statin to high intensity, atorvastatin 40 mg.  I would rather him be on atorvastatin and simvastatin for potential drug drug interactions.  He continues with aspirin 81.  Please let us know if we can be of further assistance. He was happy to talk with me, especially since his wife is my patient as well.  I will give her a call to let her know what we discussed at his request.  Telephone #[phone].     For questions or updates, please contact [place] Please consult [email or web address] for contact  info under     Signed, Candee Furbish, MD  03/23/2019 11:41 AM

## 2019-03-23 NOTE — TOC Initial Note (Signed)
Transition of Care [facility]) - Initial/Assessment Note    Patient Details  Name: Paul [name] MRN: [id number] Date of Birth: 1946/09/06  [facility] CM/SW Contact:    Zenon Mayo, RN Phone Number: 03/23/2019, 12:11 PM  Clinical Narrative:                 From home with spouse, he has home oxygen with [employer] 2 liters at night.  He is indept pta, copd, he has PCP, he has no problem getting medications, preferred pharmacy is [facility] in [place]. He may have to have continous oxygen when discharged.   Expected Discharge Plan: Home/Self Care Barriers to Discharge: No Barriers Identified   Patient Goals and CMS Choice Patient states their goals for this hospitalization and ongoing recovery are:: get better   Choice offered to / list presented to : NA  Expected Discharge Plan and Services Expected Discharge Plan: Home/Self Care In-house Referral: NA Discharge Planning Services: CM Consult   Living arrangements for the past 2 months: Single Family Home                     HH Arranged: NA    Prior Living Arrangements/Services Living arrangements for the past 2 months: Single Family Home Lives with:: Spouse   Do you feel safe going back to the place where you live?: Yes          Current home services: DME(oxygen with [employer] 2 liters at night)    Activities of Daily Living Home Assistive Devices/Equipment: Oxygen ADL Screening (condition at time of admission) Patient's cognitive ability adequate to safely complete daily activities?: Yes Is the patient deaf or have difficulty hearing?: No Does the patient have difficulty seeing, even when wearing glasses/contacts?: Yes Does the patient have difficulty concentrating, remembering, or making decisions?: No Patient able to express need for assistance with ADLs?: No Does the patient have difficulty dressing or bathing?: No Independently performs ADLs?: No Communication: Independent Dressing (OT): Needs  assistance Is this a change from baseline?: Pre-admission baseline(pt sob over the month) Grooming: Needs assistance Is this a change from baseline?: Pre-admission baseline Feeding: Independent Bathing: Needs assistance(over te past month) Is this a change from baseline?: Pre-admission baseline Toileting: Independent In/Out Bed: Independent Walks in Home: Independent Does the patient have difficulty walking or climbing stairs?: No Weakness of Legs: None Weakness of Arms/Hands: None  Permission Sought/Granted                  Emotional Assessment   Attitude/Demeanor/Rapport: Engaged Affect (typically observed): Appropriate Orientation: : Oriented to Self, Oriented to Place, Oriented to  Time, Oriented to Situation   Psych Involvement: No (comment)  Admission diagnosis:  COPD exacerbation ([place]) [J44.1] Patient Active Problem List   Diagnosis Date Noted  . COPD exacerbation ([place]) 03/19/2019  . Lung nodule < 6cm on CT 05/29/2016  . Former cigarette smoker 12/05/2015  . Chronic respiratory failure with hypoxia ([place]) 01/27/2013  . COPD (chronic obstructive pulmonary disease), gold stage C.   . High cholesterol    PCP:  [name], MD Pharmacy:   [facility] [place], [address] [place] [address] Phone: [phone] Fax: [place], [place] [address] [place] [address] Phone: [phone] Fax: [phone]     Social Determinants  of Health (SDOH) Interventions    Readmission Risk Interventions No flowsheet data found.  

## 2019-03-24 DIAGNOSIS — J9621 Acute and chronic respiratory failure with hypoxia: Secondary | ICD-10-CM

## 2019-03-24 DIAGNOSIS — I480 Paroxysmal atrial fibrillation: Secondary | ICD-10-CM

## 2019-03-24 LAB — CBC
HCT: 38.1 % — ABNORMAL LOW (ref 39.0–52.0)
Hemoglobin: 12.4 g/dL — ABNORMAL LOW (ref 13.0–17.0)
MCH: 25.6 pg — ABNORMAL LOW (ref 26.0–34.0)
MCHC: 32.5 g/dL (ref 30.0–36.0)
MCV: 78.6 fL — ABNORMAL LOW (ref 80.0–100.0)
Platelets: 300 10*3/uL (ref 150–400)
RBC: 4.85 MIL/uL (ref 4.22–5.81)
RDW: 13.9 % (ref 11.5–15.5)
WBC: 9.7 10*3/uL (ref 4.0–10.5)
nRBC: 0 % (ref 0.0–0.2)

## 2019-03-24 LAB — BASIC METABOLIC PANEL
Anion gap: 13 (ref 5–15)
BUN: 11 mg/dL (ref 8–23)
CO2: 27 mmol/L (ref 22–32)
Calcium: 8.8 mg/dL — ABNORMAL LOW (ref 8.9–10.3)
Chloride: 95 mmol/L — ABNORMAL LOW (ref 98–111)
Creatinine, Ser: 0.77 mg/dL (ref 0.61–1.24)
GFR calc Af Amer: >60 mL/min (ref >60)
GFR calc non Af Amer: >60 mL/min (ref >60)
Glucose, Bld: 171 mg/dL — ABNORMAL HIGH (ref 70–99)
POTASSIUM: 3.8 mmol/L (ref 3.5–5.1)
Sodium: 135 mmol/L (ref 135–145)

## 2019-03-24 MED ORDER — FUROSEMIDE 20 MG PO TABS
20.0000 mg | ORAL_TABLET | Freq: Every day | ORAL | Status: DC
  Administered 2019-03-24 – 2019-03-25 (×2): 20 mg via ORAL
  Filled 2019-03-24 (×2): qty 1

## 2019-03-24 NOTE — Progress Notes (Signed)
Progress Note  Patient Name: Paul [name] Date of Encounter: 03/24/2019  Primary Cardiologist: Candee Furbish, MD New  Telephonic interaction secondary to COVID-19 crisis.   Subjective   States he is feeling much better today. Sounds comfortable on phone. No CP, no syncope, no dizziness. Walking more comfortably.   Inpatient Medications    Scheduled Meds:  albuterol  2.5 mg Nebulization TID   apixaban  5 mg Oral BID   atorvastatin  40 mg Oral QPC supper   azithromycin  250 mg Oral Daily   diltiazem  360 mg Oral Daily   fluticasone furoate-vilanterol  1 puff Inhalation Daily   loratadine  10 mg Oral Daily   methylPREDNISolone (SOLU-MEDROL) injection  40 mg Intravenous Q8H   omega-3 acid ethyl esters  1 g Oral QPC supper   pantoprazole  40 mg Oral Daily   umeclidinium bromide  1 puff Inhalation Daily   Continuous Infusions:  PRN Meds: acetaminophen **OR** acetaminophen, albuterol, hydrALAZINE, Melatonin, ondansetron **OR** ondansetron (ZOFRAN) IV   Vital Signs    Vitals:   03/23/19 1914 03/23/19 2001 03/24/19 0019 03/24/19 0503  BP: 127/69  (!) 148/65 140/67  Pulse: (!) 102  88 89  Resp: 18   18  Temp: 99.1 F (37.3 C)  98.4 F (36.9 C) 98.4 F (36.9 C)  TempSrc: Oral  Oral Oral  SpO2: 91% 93% 98% 97%  Weight:    60 kg  Height:        Intake/Output Summary (Last 24 hours) at 03/24/2019 0641 Last data filed at 03/24/2019 0500 Gross per 24 hour  Intake 360 ml  Output 1000 ml  Net -640 ml   Last 3 Weights 03/24/2019 03/23/2019 03/19/2019  Weight (lbs) 132 lb 3.2 oz 132 lb 11.2 oz 143 lb 15.4 oz  Weight (kg) 59.966 kg 60.192 kg 65.3 kg      Telemetry    Converted to NSR on 3/28 at 1038am. Now NSR - Personally Reviewed  ECG    Prior AFIB RVR- Personally Reviewed  Physical Exam  Telephonic interaction performed secondary to exposure risks with COVID-19 pandemic.   GEN: No acute distress.  Resting in bed. Respiratory: Able to complete sentence  comfortably at rest. Psych: Normal affect   Labs    Chemistry Recent Labs  Lab 03/19/19 1301  03/21/19 0320 03/22/19 0621 03/23/19 0401  NA  --    < > 136 138 137  K  --    < > 3.8 3.6 3.5  CL  --    < > 96* 97* 95*  CO2  --    < > 30 31 34*  GLUCOSE  --    < > 128* 110* 103*  BUN  --    < > 8 5* 8  CREATININE  --    < > 0.72 0.72 0.85  CALCIUM  --    < > 8.5* 8.7* 8.6*  PROT 6.5  --   --   --   --   ALBUMIN 3.6  --   --   --   --   AST 29  --   --   --   --   ALT 46*  --   --   --   --   ALKPHOS 54  --   --   --   --   BILITOT 0.5  --   --   --   --   GFRNONAA  --    < > >60 >  60 >60  GFRAA  --    < > >60 >60 >60  ANIONGAP  --    < > 10 10 8    < > = values in this interval not displayed.     Hematology Recent Labs  Lab 03/19/19 2212 03/20/19 0607 03/24/19 0529  WBC 10.3 11.4* 9.7  RBC 4.73 5.12 4.85  HGB 12.3* 12.7* 12.4*  HCT 38.5* 42.1 38.1*  MCV 81.4 82.2 78.6*  MCH 26.0 24.8* 25.6*  MCHC 31.9 30.2 32.5  RDW 14.2 14.4 13.9  PLT 336 331 300    Cardiac Enzymes Recent Labs  Lab 03/19/19 1047 03/22/19 1537  TROPONINI <0.03 <0.03   No results for input(s): TROPIPOC in the last 168 hours.   BNP Recent Labs  Lab 03/19/19 1056  BNP 38.2     DDimer No results for input(s): DDIMER in the last 168 hours.   Radiology    Ct Chest Wo Contrast  Result Date: 03/22/2019 CLINICAL DATA:  73 year old male with a history of COPD exacerbation, possible viral infection EXAM: CT CHEST WITHOUT CONTRAST TECHNIQUE: Multidetector CT imaging of the chest was performed following the standard protocol without IV contrast. COMPARISON:  03/10/2019, 12/07/2018 FINDINGS: Cardiovascular: Heart size within normal limits. No pericardial fluid/thickening. Calcifications of the left main, left anterior descending, circumflex, right coronary arteries. Unremarkable course caliber and contour of the thoracic aorta. Calcifications of the aortic arch. No periaortic fluid. Unremarkable  diameter of the main pulmonary artery, without enlargement of the pulmonary arteries. Mediastinum/Nodes: No mediastinal adenopathy. Unremarkable appearance of the thoracic inlet. No supraclavicular or axillary adenopathy. Unremarkable course of the thoracic esophagus. Lungs/Pleura: Centrilobular and paraseptal emphysema. Pleuroparenchymal scarring at the bilateral lung apices. Nodule at the left apex image 23 of series 8, and the right upper lobe on image 58 of series 8. No endotracheal or endobronchial debris. No bronchial wall thickening. No confluent airspace disease. No nodular or ground-glass opacity. No pneumothorax or pleural effusion. Calcified granulomatous of the right upper lobe. Upper Abdomen: No acute finding of the upper abdomen. Musculoskeletal: Irregularity of superior endplates of T4 and T5 are unchanged. No acute displaced fracture. IMPRESSION: No acute CT finding, with no CT evidence of pneumonia or bronchitis. Paraseptal and centrilobular emphysema. Redemonstration of small lung nodules. Follow-up chest CT is again recommended in 12 months. Aortic atherosclerosis and coronary artery disease. Aortic Atherosclerosis (ICD10-I70.0). 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 Electronically Signed   By: Corrie Mckusick D.O.   On: 03/22/2019 15:49    Cardiac Studies   FEV1:29%  CT chest - 3 vessel coronary calcium/atheroscelrosis and aortic atherosclerosis   Patient Profile     73 y.o. 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  Assessment & Plan    Severe COPD, acute on chronic hypoxic respiratory failure  - BNP normal   - Empiric lasix 40mg  IV did not seem to change dyspnea. Although there may be a component of diastolic dysfunction, I believe that his  severe pulmonary issues (FEV1 -29%) are driving his situation.   - Nonetheless, let's give him low dose lasix 20mg  PO QD to take now and at home. Daily weights. Now 60Kg from 65 on arrival. Fluids 1-1.5L a day. Discussed with him.   - CXR and CT without edema  - Normal heart size on  CT/CXR, no pericardial effusion  - Now back in NSR and still SOB with exertion  - Continue with aggressive pulmonary treatment. Possible need for continuous O2 at home given hypoxia.   - COVID-19 negative  Paroxysmal atrial fibrillation  - Eliquis (CHADS-VASC 2, age and aortic atherosclerosis)  - Diltiazem CD 360. Avoid amiodarone and beta blockers given his underlying lung dz.   - Monitor for any signs of bleeding.  - Converted to NSR on 3/28. Excellent.  - As outpatient after COVID-19 crisis, will complete evaluation with ECHO  Aortic and multivessel coronary atherosclerosis  - Changed to high intensity statin, atorvastatin 40mg  PO QD. Check lipids and ALT as outpatient in 3 months.   - ASA 81. Aggressive secondary prevention.  - No chest pain.     BMET in 1-2 weeks as outpatient.   Spoke to wife ([name]) patient of mine.   Hopefully able to DC soon.  Will set up telehealth visit in follow up in 2-4 weeks.     For questions or updates, please contact [place] Please consult [email or web address] for contact info under        Signed, Candee Furbish, MD  03/24/2019, 6:41 AM

## 2019-03-24 NOTE — Consult Note (Signed)
   [facility] CM Inpatient Consult   03/24/2019  Hubert Raatz Aug 22, 1946 [id number]  Patient screened for medium risk score and 1  hospitalizations and 3 ED visits in the past 6 months with Medicare/NG.   Patient admitted with COPD exacerbation and need for 02. Chart reviewed for post hospital follow up needs and per MD consult notes:  Lemond Griffee is a 73 y.o. male with a hx of severe COPD, FEV1 of 29% who is being evaluated for shortness of breath.  [place] 19 was negative. Patient would benefit from post hospital EMMI follow up.  Will assign to COPD EMMI. Spoke with patient via phone and he feels he will not need a lot of calls but agrees to General EMMI calls.  For questions contact:   Natividad Brood, RN BSN [facility] Liaison  [phone] business mobile phone Toll free office [phone]

## 2019-03-24 NOTE — Progress Notes (Signed)
PROGRESS NOTE  Paul [name] NTI:[id number] DOB: 19-Nov-1946 DOA: 03/19/2019 PCP: [name], MD  HPI/Recap of past 24 hours:  Sitting up in chair, denies chest pain, no sob at rest, but o2 dropped to 86% while ambulating No edema, no cough, no fever Currently sinus rhythm  Assessment/Plan: Active Problems:   COPD exacerbation (HCC)   Acute on Hypoxic respiratory failure COPD exacerbation, Progression of severe COPD -Emphysema, with no evidence of superimposed acute cardiopulmonary disease -improving on iv steroids, oral steroids, will change to oral prednisone tomorrow, o2 dropped to 86% while ambulating on room air, will need home o2 ( previously on nighttime o2 only  My hospitalist colleague Dr Loleta Books Discussed with Pulmonology by phone; they recommend augmenting steroids to Solu-medrol TID for 48 hours and increasing respiratory therapies.  If poor improvement, d/c with new O2 and follow up with Dr. Vaughan Browner.    Negative CoV result today makes coronavirus infection very unlikely, and so we will change to frequent nebulized bronchilators. paroxsymal afib, New diagnosis of afib -he is started on eliquis (CHADS-VASC 2, age and aortic atherosclerosis) -cardiology consulted recommend "Diltiazem CD 360. Avoid amiodarone and beta blockers given his underlying lung dz." -outpatient echo per cardiology   Aortic and multivessel coronary atherosclerosis  - Changed to high intensity statin, atorvastatin 40mg  PO QD. Check lipids and ALT as outpatient in 3 months.   - ASA 81. Aggressive secondary prevention.  - No chest pain.     Code Status: full  Family Communication: patient   Disposition Plan: home with home 02 , new on eliquis,   Consultants:  cardiology  Procedures:  none  Antibiotics:  zitrhomax   Objective: BP 133/60 (BP Location: Right Arm)    Pulse 93    Temp 98.5 F (36.9 C) (Oral)    Resp 20    Ht 5\' 6"  (8.676 m)    Wt 60 kg Comment: scale c   SpO2 95%    BMI  21.34 kg/m   Intake/Output Summary (Last 24 hours) at 03/24/2019 1132 Last data filed at 03/24/2019 0500 Gross per 24 hour  Intake 360 ml  Output 825 ml  Net -465 ml   Filed Weights   03/19/19 2131 03/23/19 1700 03/24/19 0503  Weight: 65.3 kg 60.2 kg 60 kg    Exam: Patient is examined daily including today on 03/24/2019, exams remain the same as of yesterday except that has changed    General:  NAD  Cardiovascular: RRR  Respiratory: CTABL  Abdomen: Soft/ND/NT, positive BS  Musculoskeletal: No Edema  Neuro: alert, oriented   Data Reviewed: Basic Metabolic Panel: Recent Labs  Lab 03/20/19 0607 03/21/19 0320 03/22/19 0621 03/23/19 0401 03/24/19 0529  NA 139 136 138 137 135  K 3.7 3.8 3.6 3.5 3.8  CL 99 96* 97* 95* 95*  CO2 31 30 31  34* 27  GLUCOSE 102* 128* 110* 103* 171*  BUN 8 8 5* 8 11  CREATININE 0.72 0.72 0.72 0.85 0.77  CALCIUM 8.7* 8.5* 8.7* 8.6* 8.8*  MG  --  2.0  --   --   --    Liver Function Tests: Recent Labs  Lab 03/19/19 1301  AST 29  ALT 46*  ALKPHOS 54  BILITOT 0.5  PROT 6.5  ALBUMIN 3.6   No results for input(s): LIPASE, AMYLASE in the last 168 hours. No results for input(s): AMMONIA in the last 168 hours. CBC: Recent Labs  Lab 03/19/19 1056 03/19/19 1143 03/19/19 1434 03/19/19 2212 03/20/19 1950 03/24/19  0529  WBC 12.7*  --   --  10.3 11.4* 9.7  NEUTROABS 12.1*  --   --   --   --   --   HGB 13.2 14.3 12.2* 12.3* 12.7* 12.4*  HCT 43.0 42.0 36.0* 38.5* 42.1 38.1*  MCV 82.2  --   --  81.4 82.2 78.6*  PLT 349  --   --  336 331 300   Cardiac Enzymes:   Recent Labs  Lab 03/19/19 1047 03/22/19 1537  TROPONINI <0.03 <0.03   BNP (last 3 results) Recent Labs    03/19/19 1056  BNP 38.2    ProBNP (last 3 results) No results for input(s): PROBNP in the last 8760 hours.  CBG: No results for input(s): GLUCAP in the last 168 hours.  Recent Results (from the past 240 hour(s))  Respiratory Panel by PCR     Status: None    Collection Time: 03/19/19  6:41 PM  Result Value Ref Range Status   Adenovirus NOT DETECTED NOT DETECTED Final   Coronavirus 229E NOT DETECTED NOT DETECTED Final    Comment: (NOTE) The Coronavirus on the Respiratory Panel, DOES NOT test for the novel  Coronavirus (2019 nCoV)    Coronavirus HKU1 NOT DETECTED NOT DETECTED Final   Coronavirus NL63 NOT DETECTED NOT DETECTED Final   Coronavirus OC43 NOT DETECTED NOT DETECTED Final   Metapneumovirus NOT DETECTED NOT DETECTED Final   Rhinovirus / Enterovirus NOT DETECTED NOT DETECTED Final   Influenza A NOT DETECTED NOT DETECTED Final   Influenza B NOT DETECTED NOT DETECTED Final   Parainfluenza Virus 1 NOT DETECTED NOT DETECTED Final   Parainfluenza Virus 2 NOT DETECTED NOT DETECTED Final   Parainfluenza Virus 3 NOT DETECTED NOT DETECTED Final   Parainfluenza Virus 4 NOT DETECTED NOT DETECTED Final   Respiratory Syncytial Virus NOT DETECTED NOT DETECTED Final   Bordetella pertussis NOT DETECTED NOT DETECTED Final   Chlamydophila pneumoniae NOT DETECTED NOT DETECTED Final   Mycoplasma pneumoniae NOT DETECTED NOT DETECTED Final    Comment: Performed at [facility], [address], [place], [place] [address]  Novel Coronavirus, NAA (hospital order; send-out to ref lab)     Status: None   Collection Time: 03/19/19  9:30 PM  Result Value Ref Range Status   SARS-CoV-2, NAA NOT DETECTED NOT DETECTED Final    Comment: (NOTE) This test was developed and its performance characteristics determined by [employer]. This test has not been FDA cleared or approved. This test has been authorized by FDA under an Emergency Use Authorization (EUA). This test has been validated in accordance with the FDA's Guidance Document (Policy for [place] in Laboratories Certified to Perform High Complexity Testing under CLIA prior to Emergency Use Authorization for Coronavirus [id number] during the [facility] Emergency) issued on  February 29th, 2020. FDA independent review of this validation is pending. This test is only authorized for the duration of time the declaration that circumstances exist justifying the authorization of the emergency use of in vitro diagnostic tests for detection of SARS-CoV- 2 virus and/or diagnosis of COVID-19 infection under section 564(b)(1) of the Act, 21 U.S.C. 485IOE-7(O)(3), unless the authorization is terminated or revoked sooner. Performed At: [facility] [address] [place], [place] [id number] Rush Farmer MD [id number]    [place]  Final    Comment: Performed at [facility], [place] [address], [place], [place] [address]     Studies: No results found.  Scheduled Meds:  albuterol  2.5 mg Nebulization TID   apixaban  5 mg Oral BID   atorvastatin  40 mg Oral QPC supper   azithromycin  250 mg Oral Daily   diltiazem  360 mg Oral Daily   fluticasone furoate-vilanterol  1 puff Inhalation Daily   furosemide  20 mg Oral Daily   loratadine  10 mg Oral Daily   methylPREDNISolone (SOLU-MEDROL) injection  40 mg Intravenous Q8H   omega-3 acid ethyl esters  1 g Oral QPC supper   pantoprazole  40 mg Oral Daily   umeclidinium bromide  1 puff Inhalation Daily    Continuous Infusions:   Time spent: 81mins I have personally reviewed and interpreted on  03/24/2019 daily labs, tele strips, imagings as discussed above under date review session and assessment and plans.  I reviewed all nursing notes, pharmacy notes, consultant notes,  vitals, pertinent old records  I have discussed plan of care as described above with RN , patient on 03/24/2019   Florencia Reasons MD, PhD  [facility] Pager [phone]. If 7PM-7AM, please contact night-coverage at [email or web address], password [facility] 03/24/2019, 11:32 AM  LOS: 5 days

## 2019-03-24 NOTE — TOC Progression Note (Signed)
Transition of Care [facility]) - Progression Note    Patient Details  Name: Paul [name] MRN: [id number] Date of Birth: 12/20/1946  [facility] CM/SW Contact  Sherrilyn Rist Phone Number: [phone] 03/24/2019, 1:17 PM  Clinical Narrative:    Patient is active with [employer]) for home oxygen; Brad with [employer] called for continuous oxygen. He will deliver a portable oxygen tank to the home; Eliquis coupon card given to patient with explanation of usage.   Expected Discharge Plan: Home/Self Care Barriers to Discharge: No Barriers Identified  Expected Discharge Plan and Services Expected Discharge Plan: Home/Self Care In-house Referral: NA Discharge Planning Services: CM Consult   Living arrangements for the past 2 months: Single Family Home                     HH Arranged: NA     Social Determinants of Health (SDOH) Interventions    Readmission Risk Interventions No flowsheet data found.

## 2019-03-24 NOTE — Evaluation (Signed)
Physical Therapy Evaluation & Discharge Patient Details Name: Paul [name] MRN: [id number] DOB: [birth year] Today's Date: 03/24/2019   History of Present Illness  Pt is a 73 y.o. male admitted 03/19/19 with worsening DOE; worked up for COPD exacerbation and new onset afib. CXR clear; (-) COVID. PMH includes COPD (on 2L O2 at night).    Clinical Impression  Patient evaluated by Physical Therapy with no further acute PT needs identified. PTA, pt indep and lives with family. Today, pt indep with ambulation and ADLs. Required 2L O2 [place] to maintain SpO2 >/88% (see SATURATIONS QUALIFICATIONS NOTE). Educ energy conservation strategies, deep breathing, home O2 yse. All education has been completed and the patient has no further questions. PT is signing off. Thank you for this referral.   Follow Up Recommendations No PT follow up    Equipment Recommendations  (home O2)    Recommendations for Other Services       Precautions / Restrictions Precautions Precautions: None Restrictions Weight Bearing Restrictions: No      Mobility  Bed Mobility Overal bed mobility: Independent                Transfers Overall transfer level: Independent Equipment used: None                Ambulation/Gait Ambulation/Gait assistance: Independent Gait Distance (Feet): 450 Feet Assistive device: None Gait Pattern/deviations: Step-through pattern;Decreased stride length Gait velocity: Decreased Gait velocity interpretation: 1.31 - 2.62 ft/sec, indicative of limited community ambulator General Gait Details: Slow, steady gait without DME. SpO2 down to 86% on RA; maintained >93% on 2L O2 [place]. Pt able to demonstrate pushing O2 tank independently  Stairs            Wheelchair Mobility    Modified Rankin (Stroke Patients Only)       Balance Overall balance assessment: No apparent balance deficits (not formally assessed)                                            Pertinent Vitals/Pain Pain Assessment: No/denies pain    Home Living Family/patient expects to be discharged to:: Private residence Living Arrangements: Spouse/significant other;Children Available Help at Discharge: Family;Available 24 hours/day Type of Home: House Home Access: Level entry     Home Layout: One level   Additional Comments: Wears 2L O2 at night    Prior Function Level of Independence: Independent               Hand Dominance        Extremity/Trunk Assessment   Upper Extremity Assessment Upper Extremity Assessment: Overall WFL for tasks assessed    Lower Extremity Assessment Lower Extremity Assessment: Overall WFL for tasks assessed    Cervical / Trunk Assessment Cervical / Trunk Assessment: Normal  Communication   Communication: No difficulties  Cognition Arousal/Alertness: Awake/alert Behavior During Therapy: WFL for tasks assessed/performed Overall Cognitive Status: Within Functional Limits for tasks assessed                                        General Comments      Exercises     Assessment/Plan    PT Assessment Patent does not need any further PT services  PT Problem List         PT Treatment  Interventions      PT Goals (Current goals can be found in the Care Plan section)  Acute Rehab PT Goals PT Goal Formulation: All assessment and education complete, DC therapy    Frequency     Barriers to discharge        Co-evaluation               AM-PAC PT "6 Clicks" Mobility  Outcome Measure Help needed turning from your back to your side while in a flat bed without using bedrails?: None Help needed moving from lying on your back to sitting on the side of a flat bed without using bedrails?: None Help needed moving to and from a bed to a chair (including a wheelchair)?: None Help needed standing up from a chair using your arms (e.g., wheelchair or bedside chair)?: None Help needed to walk in  hospital room?: None Help needed climbing 3-5 steps with a railing? : None 6 Click Score: 24    End of Session Equipment Utilized During Treatment: Oxygen Activity Tolerance: Patient tolerated treatment well Patient left: in chair;with call bell/phone within reach Nurse Communication: Mobility status PT Visit Diagnosis: Other abnormalities of gait and mobility (R26.89)    Time: 8185-6314 PT Time Calculation (min) (ACUTE ONLY): 14 min   Charges:   PT Evaluation $PT Eval Low Complexity: [place], PT, DPT Acute Rehabilitation Services  Pager [phone] Office [place] 03/24/2019, 9:30 AM

## 2019-03-24 NOTE — Progress Notes (Signed)
SATURATION QUALIFICATIONS: (This note is used to comply with regulatory documentation for home oxygen)  Patient Saturations on Room Air at Rest = 90%  Patient Saturations on Room Air while Ambulating = 86%  Patient Saturations on 2 Liters of oxygen while Ambulating = 93%  Please briefly explain why patient needs home oxygen: Pt requires supplemental O2 to maintain SpO2 >/88% with mobility.   Mabeline Caras, PT, DPT Acute Rehabilitation Services  Pager [phone] Office [phone]

## 2019-03-25 LAB — BASIC METABOLIC PANEL
Anion gap: 8 (ref 5–15)
BUN: 21 mg/dL (ref 8–23)
CO2: 29 mmol/L (ref 22–32)
Calcium: 8.8 mg/dL — ABNORMAL LOW (ref 8.9–10.3)
Chloride: 100 mmol/L (ref 98–111)
Creatinine, Ser: 0.84 mg/dL (ref 0.61–1.24)
GFR calc Af Amer: >60 mL/min (ref >60)
GFR calc non Af Amer: >60 mL/min (ref >60)
Glucose, Bld: 157 mg/dL — ABNORMAL HIGH (ref 70–99)
Potassium: 3.5 mmol/L (ref 3.5–5.1)
Sodium: 137 mmol/L (ref 135–145)

## 2019-03-25 LAB — CBC WITH DIFFERENTIAL/PLATELET
Abs Immature Granulocytes: 0.07 10*3/uL (ref 0.00–0.07)
Basophils Absolute: 0 10*3/uL (ref 0.0–0.1)
Basophils Relative: 0 %
Eosinophils Absolute: 0 10*3/uL (ref 0.0–0.5)
Eosinophils Relative: 0 %
HCT: 37.6 % — ABNORMAL LOW (ref 39.0–52.0)
Hemoglobin: 11.7 g/dL — ABNORMAL LOW (ref 13.0–17.0)
Immature Granulocytes: 0 %
Lymphocytes Relative: 3 %
Lymphs Abs: 0.4 10*3/uL — ABNORMAL LOW (ref 0.7–4.0)
MCH: 24.5 pg — ABNORMAL LOW (ref 26.0–34.0)
MCHC: 31.1 g/dL (ref 30.0–36.0)
MCV: 78.7 fL — ABNORMAL LOW (ref 80.0–100.0)
Monocytes Absolute: 0.3 10*3/uL (ref 0.1–1.0)
Monocytes Relative: 2 %
Neutro Abs: 15.7 10*3/uL — ABNORMAL HIGH (ref 1.7–7.7)
Neutrophils Relative %: 95 %
Platelets: 314 10*3/uL (ref 150–400)
RBC: 4.78 MIL/uL (ref 4.22–5.81)
RDW: 14.1 % (ref 11.5–15.5)
WBC: 16.5 10*3/uL — ABNORMAL HIGH (ref 4.0–10.5)
nRBC: 0 % (ref 0.0–0.2)

## 2019-03-25 LAB — MAGNESIUM: Magnesium: 2 mg/dL (ref 1.7–2.4)

## 2019-03-25 MED ORDER — FUROSEMIDE 20 MG PO TABS: 20.0000 mg | ORAL_TABLET | Freq: Every day | ORAL | 0 refills | Status: DC

## 2019-03-25 MED ORDER — DILTIAZEM HCL ER COATED BEADS 360 MG PO CP24: 360.0000 mg | ORAL_CAPSULE | Freq: Every day | ORAL | 0 refills | Status: DC

## 2019-03-25 MED ORDER — METHYLPREDNISOLONE SODIUM SUCC 40 MG IJ SOLR: 40.0000 mg | Freq: Two times a day (BID) | INTRAMUSCULAR | Status: DC

## 2019-03-25 MED ORDER — APIXABAN 5 MG PO TABS: 5.0000 mg | ORAL_TABLET | Freq: Two times a day (BID) | ORAL | 0 refills | Status: DC

## 2019-03-25 MED ORDER — ATORVASTATIN CALCIUM 40 MG PO TABS: 40.0000 mg | ORAL_TABLET | Freq: Every day | ORAL | 0 refills | Status: DC

## 2019-03-25 NOTE — Progress Notes (Signed)
Patient was discharge home. Medication and discharge instructions given with teach back.  Patient's questions were answered.patient belongings with patient: cellphone, clothes,shoes, glasses, dentures. Oxygen with patient to take home.  Nurse tech took patient in wheelchair on oxygen on 2L. No further questions or concerns.

## 2019-03-25 NOTE — Discharge Summary (Signed)
Discharge Summary  Paul [name] NIO:[id number] DOB: 07/21/1946  PCP: [name], MD  Admit date: 03/19/2019 Discharge date: 03/25/2019  Time spent: 59mins, more than 50% time spent on coordination of care.  Recommendations for Outpatient Follow-up:  1. F/u with PCP within a week  for hospital discharge follow up, repeat cbc/bmp at follow up 2. F/u with cardiology for new diagnosis of afib 3. Home o2  Discharge Diagnoses:  Active Hospital Problems   Diagnosis Date Noted   PAF (paroxysmal atrial fibrillation) (HCC)    Acute on chronic respiratory failure with hypoxia (HCC)    COPD exacerbation ([place]) 03/19/2019    Resolved Hospital Problems  No resolved problems to display.    Discharge Condition: stable  Diet recommendation: heart healthy  Filed Weights   03/23/19 1700 03/24/19 0503 03/25/19 0528  Weight: 60.2 kg 60 kg 62.3 kg    History of present illness: (per admitting MD Dr Earnest Conroy) PCP: [name], MD  Patient coming from: Home  Chief Complaint: Worsening shortness of breath  HPI: Paul [name] is a 73 y.o. male with history h/o COPD, hyperlipidemia who at baseline was using 2 L O2 via nasal cannula only at night and follows [facility] (Dr. Vaughan Browner) as outpatient presents with worsening shortness of breath.  Patient reports that at baseline he has cough with white phlegm and is able to walk around doing his ADLs without shortness of breath and uses O2 only at night.  2 weeks back he developed productive cough with green phlegm and shortness of breath but no fevers.  He underwent CT chest with contrast on March 18 which was negative for PE or infiltrates.  He apparently was given doxycycline/prednisone course at that time which helped him transiently but symptoms worsened again past Monday with shortness of breath on minimum exertion/cough and he noticed his pulse ox was dropping to 74% .  He started using 2 L O2 nasal cannula continuous.  On Tuesday, he  called pulmonary office given persistent symptoms and also pulse oximetry showing O2 sats 84% on exertion and 93% at rest in spite of using continuous O2.  He was advised by Dr. Vaughan Browner to use 60 mg prednisone x3 days followed by 10 mg taper every 3 days.  He was also advised to use nebulizer treatments every 3 hours and continue Symbicort inhaler twice daily.  Patient was supposed to start 50 mg prednisone today but woke up feeling extremely short of breath.  He states he is also been having bad bouts of cough at night with wheezing and bronchospasm.   He presented to [facility] ED where he was noted to be afebrile but hypoxic, required BiPAP for 3 hours, chest x-ray was negative for infiltrates.  Patient denies any recent history of travel or known sick contacts with flu positive or coronavirus positive patients.  He lives at home with his wife who is recovering from lung cancer and completed chemotherapy 1 month back.   Hospitalist service was called to request direct admission to medical floor but ED to ED transfer facilitated for thorough clinical evaluation prior to requesting medical bed given coronavirus pandemic.  In the ED at [facility], patient has been saturating well on 3 L nasal cannula but appears tachypneic on talking full sentences.  He denies any chest pain, denies any fevers or leg swellings.  Hospital Course:  Active Problems:   COPD exacerbation (HCC)   PAF (paroxysmal atrial fibrillation) (HCC)   Acute on chronic respiratory failure with  hypoxia (HCC)   Acute on Hypoxic respiratory failure, COPDexacerbation, Progression of severe COPD -Emphysema, with no evidence of superimposed acute cardiopulmonary disease -improving on iv steroids, oral zitrhomax, he finished total of 5 days of zithromax in the hospital, he is discharged on slow prednisone taper.  - o2 dropped to 86% while ambulating on room air, discharged on continuous home o2 ( previously on nighttime o2 only) -he is  to follow with Dr Vaughan Browner  My hospitalist colleague Dr Loleta Books Discussed with Pulmonology Dr Vaughan Browner by phone.  Negative CoV result today makes coronavirus infection very unlikely, and so we will change to frequent nebulized bronchilators.  paroxsymal afib, New diagnosis of afib -he is started on eliquis (CHADS-VASC 2, age and aortic atherosclerosis), cardizem, lasix -cardiology consulted recommend "Diltiazem CD 360. Avoid amiodarone and beta blockers given his underlying lung dz." -outpatient echo per cardiology   Aortic and multivessel coronary atherosclerosis - Changed to high intensity statin, atorvastatin 40mg  PO QD. Check lipids and ALT as outpatient in 3 months.  - ASA 81. Aggressive secondary prevention. - No chest pain.   Code Status: full  Family Communication: patient   Disposition Plan: home with home 02 , new on eliquis,   Consultants:  cardiology  Procedures:  none  Antibiotics:  zitrhomax   Discharge Exam: BP 130/66    Pulse 90    Temp (!) 97.3 F (36.3 C) (Oral)    Resp 18    Ht 5\' 6"  (1.676 m)    Wt 62.3 kg    SpO2 94%    BMI 22.17 kg/m   General: NAD Cardiovascular: RRR Respiratory: CTABL, no wheezing at discharge   Discharge Instructions You were cared for by a hospitalist during your hospital stay. If you have any questions about your discharge medications or the care you received while you were in the hospital after you are discharged, you can call the unit and asked to speak with the hospitalist on call if the hospitalist that took care of you is not available. Once you are discharged, your primary care physician will handle any further medical issues. Please note that NO REFILLS for any discharge medications will be authorized once you are discharged, as it is imperative that you return to your primary care physician (or establish a relationship with a primary care physician if you do not have one) for your aftercare needs so that  they can reassess your need for medications and monitor your lab values.  Discharge Instructions    Diet - low sodium heart healthy   Complete by:  As directed    Diet - low sodium heart healthy   Complete by:  As directed    Increase activity slowly   Complete by:  As directed    Increase activity slowly   Complete by:  As directed      Allergies as of 03/25/2019   No Known Allergies     Medication List    STOP taking these medications   doxycycline 100 MG capsule Commonly known as:  VIBRAMYCIN   simvastatin 40 MG tablet Commonly known as:  ZOCOR     TAKE these medications   ProAir HFA 108 (90 Base) MCG/ACT inhaler Generic drug:  albuterol Inhale 2 puffs into the lungs every 6 (six) hours as needed for wheezing or shortness of breath. What changed:  Another medication with the same name was changed. Make sure you understand how and when to take each.   albuterol (2.5 MG/3ML) 0.083% nebulizer solution  Commonly known as:  PROVENTIL Take 3 mLs (2.5 mg total) by nebulization every 6 (six) hours as needed for wheezing or shortness of breath. What changed:  when to take this   apixaban 5 MG Tabs tablet Commonly known as:  ELIQUIS Take 1 tablet (5 mg total) by mouth 2 (two) times daily. Please call cardiology for refills   aspirin EC 81 MG tablet Take 81 mg by mouth daily after supper.   atorvastatin 40 MG tablet Commonly known as:  LIPITOR Take 1 tablet (40 mg total) by mouth daily after supper.   benzonatate 100 MG capsule Commonly known as:  TESSALON Take 1 capsule (100 mg total) by mouth every 8 (eight) hours.   budesonide-formoterol 160-4.5 MCG/ACT inhaler Commonly known as:  Symbicort Inhale 2 puffs into the lungs 2 (two) times daily.   budesonide-formoterol 160-4.5 MCG/ACT inhaler Commonly known as:  SYMBICORT Inhale 2 puffs into the lungs 2 (two) times daily.   diltiazem 360 MG 24 hr capsule Commonly known as:  CARDIZEM CD Take 1 capsule (360 mg total)  by mouth daily. Start taking on:  March 26, 2019   fish oil-omega-3 fatty acids 1000 MG capsule Take 1 g by mouth daily after supper.   Flutter Devi Use 2-3 times daily   furosemide 20 MG tablet Commonly known as:  LASIX Take 1 tablet (20 mg total) by mouth daily. Start taking on:  March 26, 2019   loratadine 10 MG tablet Commonly known as:  CLARITIN Take 10 mg by mouth daily.   OXYGEN Inhale 2 L into the lungs continuous.   predniSONE 10 MG tablet Commonly known as:  DELTASONE Take 60mg  for 3 days, 50mg  for 3 days, 40mg  for 3 days, 30mg  for 3 days, 20mg  for 3 days, 10mg  for 3 days and stop What changed:    how much to take  how to take this  when to take this  additional instructions  Another medication with the same name was removed. Continue taking this medication, and follow the directions you see here.   Tiotropium Bromide Monohydrate 2.5 MCG/ACT Aers Commonly known as:  Spiriva Respimat Inhale 2 puffs into the lungs daily. What changed:  when to take this            Durable Medical Equipment  (From admission, onward)         Start     Ordered   03/24/19 1131  DME Oxygen  Once    Question Answer Comment  Mode or (Route) Nasal cannula   Liters per Minute 2   Frequency Continuous (stationary and portable oxygen unit needed)   Oxygen conserving device Yes   Oxygen delivery system Gas      03/24/19 1130         No Known Allergies Follow-up Information    Jerline Pain, MD Follow up.   Specialty:  Cardiology Contact information: [address] [place] [address] [phone]        [name], MD Follow up.   Specialties:  Family Medicine, Interventional Cardiology, Radiology, Anesthesiology Contact information: [address] [place] [address] [phone]        [name], MD Follow up.   Specialty:  Pulmonary Disease Contact information: [address] [place]  [address] [phone]            The results of significant diagnostics from this hospitalization (including imaging, microbiology, ancillary and laboratory) are listed  below for reference.    Significant Diagnostic Studies: Dg Chest 2 View  Result Date: 03/10/2019 CLINICAL DATA:  Cough and shortness of breath EXAM: CHEST - 2 VIEW COMPARISON:  January 02, 2019 FINDINGS: There is no edema or consolidation. Heart size and pulmonary vascularity are normal. No adenopathy. There is aortic atherosclerosis. There is mild degenerative change in the thoracic spine. IMPRESSION: No edema or consolidation. Stable cardiac silhouette. Aortic Atherosclerosis (ICD10-I70.0). Electronically Signed   By: Lowella Grip III M.D.   On: 03/10/2019 13:02   Dg Chest 2 View  Result Date: 03/03/2019 CLINICAL DATA:  Acute onset of shortness of breath and tachycardia. Productive cough. EXAM: CHEST - 2 VIEW COMPARISON:  None. FINDINGS: The lungs are well-aerated. Mild peribronchial thickening is noted. There is no evidence of focal opacification, pleural effusion or pneumothorax. The heart is normal in size; the mediastinal contour is within normal limits. No acute osseous abnormalities are seen. IMPRESSION: Mild peribronchial thickening noted. Lungs otherwise clear. Electronically Signed   By: Garald Balding M.D.   On: 03/03/2019 13:23   Dg Chest 2 View  Result Date: 02/28/2019 CLINICAL DATA:  73 year-old male c/o non-productive cough x 1 week. Pt denies fever. Pt went to his pulmonologist this week and was given abx, prednisone and had a chest xray on 02/24/2019 but isn't feeling relief. Hx of COPD EXAM: CHEST - 2 VIEW COMPARISON:  02/24/2019 FINDINGS: Midline trachea. Normal heart size. Atherosclerosis in the transverse aorta. No pleural effusion or pneumothorax. Mild biapical pleural thickening. Diffuse peribronchial thickening. No lobar consolidation. IMPRESSION: 1. No acute cardiopulmonary disease. 2. Mild  peribronchial thickening which may relate to chronic bronchitis or smoking. 3.  Aortic Atherosclerosis (ICD10-I70.0). Electronically Signed   By: [name] Miyamoto M.D.   On: 02/28/2019 11:04   Dg Chest 2 View  Result Date: 02/24/2019 CLINICAL DATA:  Cough for 2 weeks.  COPD. EXAM: CHEST - 2 VIEW COMPARISON:  12/07/2018 chest CT, 01/30/2017 chest radiograph and prior studies FINDINGS: The cardiomediastinal silhouette is unremarkable. COPD changes noted. There is no evidence of focal airspace disease, pulmonary edema, suspicious pulmonary nodule/mass, pleural effusion, or pneumothorax. No acute bony abnormalities are identified. IMPRESSION: COPD without evidence of acute cardiopulmonary disease. Electronically Signed   By: Margarette Canada M.D.   On: 02/24/2019 15:12   Ct Chest Wo Contrast  Result Date: 03/22/2019 CLINICAL DATA:  73 year old male with a history of COPD exacerbation, possible viral infection EXAM: CT CHEST WITHOUT CONTRAST TECHNIQUE: Multidetector CT imaging of the chest was performed following the standard protocol without IV contrast. COMPARISON:  03/10/2019, 12/07/2018 FINDINGS: Cardiovascular: Heart size within normal limits. No pericardial fluid/thickening. Calcifications of the left main, left anterior descending, circumflex, right coronary arteries. Unremarkable course caliber and contour of the thoracic aorta. Calcifications of the aortic arch. No periaortic fluid. Unremarkable diameter of the main pulmonary artery, without enlargement of the pulmonary arteries. Mediastinum/Nodes: No mediastinal adenopathy. Unremarkable appearance of the thoracic inlet. No supraclavicular or axillary adenopathy. Unremarkable course of the thoracic esophagus. Lungs/Pleura: Centrilobular and paraseptal emphysema. Pleuroparenchymal scarring at the bilateral lung apices. Nodule at the left apex image 23 of series 8, and the right upper lobe on image 58 of series 8. No endotracheal or endobronchial debris. No  bronchial wall thickening. No confluent airspace disease. No nodular or ground-glass opacity. No pneumothorax or pleural effusion. Calcified granulomatous of the right upper lobe. Upper Abdomen: No acute finding of the upper abdomen. Musculoskeletal: Irregularity of superior endplates of T4 and T5 are  unchanged. No acute displaced fracture. IMPRESSION: No acute CT finding, with no CT evidence of pneumonia or bronchitis. Paraseptal and centrilobular emphysema. Redemonstration of small lung nodules. Follow-up chest CT is again recommended in 12 months. Aortic atherosclerosis and coronary artery disease. Aortic Atherosclerosis (ICD10-I70.0). 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 Electronically Signed   By: Corrie Mckusick D.O.   On: 03/22/2019 15:49   Ct Angio Chest Pe W And/or Wo Contrast  Result Date: 03/10/2019 CLINICAL DATA:  Shortness of breath and hypoxia. EXAM: CT ANGIOGRAPHY CHEST WITH CONTRAST TECHNIQUE: Multidetector CT imaging of the chest was performed using the standard protocol during bolus administration of intravenous contrast. Multiplanar CT image reconstructions and MIPs were obtained to evaluate the vascular anatomy. CONTRAST:  153mL ISOVUE-370 IOPAMIDOL (ISOVUE-370) INJECTION 76% COMPARISON:  Chest radiographs obtained earlier today. Chest CT dated 12/07/2018. Chest CT dated 12/05/2017. FINDINGS: Cardiovascular: Atheromatous calcifications, including the coronary arteries and aorta. Normally opacified pulmonary arteries with no pulmonary arterial filling defects seen. Normal sized heart. Mediastinum/Nodes: No enlarged mediastinal, hilar, or axillary lymph nodes. Thyroid gland, trachea, and esophagus demonstrate no significant findings. Lungs/Pleura: Biapical calcified pleural plaques. Mild bilateral bullous changes and peribronchial thickening. Stable small left apical nodule measuring 4 mm in maximum diameter on image number 11 series 5. Stable posterior right upper lobe  nodule measuring 5 mm in maximum diameter on image number 34 series 5. Stable small curvilinear area of nodularity in the right lower lobe inferiorly on image number 89 series 5 since 12/07/2018, with a significantly smaller soft tissue component compared to 12/05/2017. Stable bilateral calcified granulomata. Upper Abdomen: Mild diffuse low density of the liver. Musculoskeletal: Thoracic spine degenerative changes and mild upper thoracic vertebral compression deformities without significant change. Review of the MIP images confirms the above findings. IMPRESSION: 1. No pulmonary emboli or acute abnormality. 2. Stable small bilateral lung nodules. Recommend a follow-up low dose screening chest CT in 1 year. 3. Mild changes of COPD and chronic bronchitis. 4. Mild diffuse hepatic steatosis. 5.  Calcific coronary artery and aortic atherosclerosis. 6. 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 Aortic Atherosclerosis (ICD10-I70.0) and Emphysema (ICD10-J43.9). Electronically Signed   By: Claudie Revering M.D.   On: 03/10/2019 15:19   Dg Chest Port 1 View  Result Date: 03/19/2019 CLINICAL DATA:  73 year old male with shortness of breath EXAM: PORTABLE CHEST 1 VIEW COMPARISON:  None. FINDINGS: Cardiomediastinal silhouette unchanged in size and contour. Calcifications of the aortic arch. Hyperinflation, similar to prior studies. No confluent airspace disease. No pleural effusion or pneumothorax. No displaced fracture. IMPRESSION: Emphysema, with no evidence of superimposed acute cardiopulmonary disease. Electronically Signed   By: Corrie Mckusick D.O.   On: 03/19/2019 11:44    Microbiology: Recent Results (from the past 240 hour(s))  Respiratory Panel by PCR     Status: None   Collection Time: 03/19/19  6:41 PM  Result Value Ref Range Status   Adenovirus NOT DETECTED NOT DETECTED Final   Coronavirus 229E NOT DETECTED NOT DETECTED Final    Comment: (NOTE) The Coronavirus on the  Respiratory Panel, DOES NOT test for the novel  Coronavirus (2019 nCoV)    Coronavirus HKU1 NOT DETECTED NOT DETECTED Final   Coronavirus NL63 NOT DETECTED NOT DETECTED Final   Coronavirus OC43 NOT DETECTED NOT DETECTED Final   Metapneumovirus NOT DETECTED NOT DETECTED Final   Rhinovirus / Enterovirus NOT DETECTED NOT DETECTED Final   Influenza A NOT DETECTED NOT DETECTED Final   Influenza B NOT  DETECTED NOT DETECTED Final   Parainfluenza Virus 1 NOT DETECTED NOT DETECTED Final   Parainfluenza Virus 2 NOT DETECTED NOT DETECTED Final   Parainfluenza Virus 3 NOT DETECTED NOT DETECTED Final   Parainfluenza Virus 4 NOT DETECTED NOT DETECTED Final   Respiratory Syncytial Virus NOT DETECTED NOT DETECTED Final   Bordetella pertussis NOT DETECTED NOT DETECTED Final   Chlamydophila pneumoniae NOT DETECTED NOT DETECTED Final   Mycoplasma pneumoniae NOT DETECTED NOT DETECTED Final    Comment: Performed at [facility], [place] [address] [place], [place] [address]  Novel Coronavirus, NAA (hospital order; send-out to ref lab)     Status: None   Collection Time: 03/19/19  9:30 PM  Result Value Ref Range Status   SARS-CoV-2, NAA NOT DETECTED NOT DETECTED Final    Comment: (NOTE) This test was developed and its performance characteristics determined by [employer]. This test has not been FDA cleared or approved. This test has been authorized by FDA under an Emergency Use Authorization (EUA). This test has been validated in accordance with the FDA's Guidance Document (Policy for [place] in Laboratories Certified to Perform High Complexity Testing under CLIA prior to Emergency Use Authorization for Coronavirus [id number] during the [facility] Emergency) issued on February 29th, 2020. FDA independent review of this validation is pending. This test is only authorized for the duration of time the declaration that circumstances exist justifying the authorization of  the emergency use of in vitro diagnostic tests for detection of SARS-CoV- 2 virus and/or diagnosis of COVID-19 infection under section 564(b)(1) of the Act, 21 U.S.C. 759FMB-8(G)(6), unless the authorization is terminated or revoked sooner. Performed At: [facility] [address] [place], [place] [id number] Rush Farmer MD XB:[id number]    [place]  Final    Comment: Performed at [facility], [place] [address] [place], [place] [address]     Labs: Basic Metabolic Panel: Recent Labs  Lab 03/21/19 0320 03/22/19 0621 03/23/19 0401 03/24/19 0529 03/25/19 0542  NA 136 138 137 135 137  K 3.8 3.6 3.5 3.8 3.5  CL 96* 97* 95* 95* 100  CO2 30 31 34* 27 29  GLUCOSE 128* 110* 103* 171* 157*  BUN 8 5* 8 11 21   CREATININE 0.72 0.72 0.85 0.77 0.84  CALCIUM 8.5* 8.7* 8.6* 8.8* 8.8*  MG 2.0  --   --   --  2.0   Liver Function Tests: Recent Labs  Lab 03/19/19 1301  AST 29  ALT 46*  ALKPHOS 54  BILITOT 0.5  PROT 6.5  ALBUMIN 3.6   No results for input(s): LIPASE, AMYLASE in the last 168 hours. No results for input(s): AMMONIA in the last 168 hours. CBC: Recent Labs  Lab 03/19/19 1056  03/19/19 1434 03/19/19 2212 03/20/19 0607 03/24/19 0529 03/25/19 0542  WBC 12.7*  --   --  10.3 11.4* 9.7 16.5*  NEUTROABS 12.1*  --   --   --   --   --  15.7*  HGB 13.2   < > 12.2* 12.3* 12.7* 12.4* 11.7*  HCT 43.0   < > 36.0* 38.5* 42.1 38.1* 37.6*  MCV 82.2  --   --  81.4 82.2 78.6* 78.7*  PLT 349  --   --  336 331 300 314   < > = values in this interval not displayed.   Cardiac Enzymes: Recent Labs  Lab 03/19/19 1047 03/22/19 1537  TROPONINI <0.03 <0.03   BNP: BNP (last  3 results) Recent Labs    03/19/19 1056  BNP 38.2    ProBNP (last 3 results) No results for input(s): PROBNP in the last 8760 hours.  CBG: No results for input(s): GLUCAP in the last 168 hours.     Signed:  Florencia Reasons MD, PhD  [facility] 03/25/2019,  11:00 AM

## 2019-03-29 ENCOUNTER — Ambulatory Visit (INDEPENDENT_AMBULATORY_CARE_PROVIDER_SITE_OTHER): Payer: Medicare Other | Admitting: Internal Medicine

## 2019-03-29 ENCOUNTER — Encounter: Payer: Self-pay | Admitting: Internal Medicine

## 2019-03-29 ENCOUNTER — Other Ambulatory Visit: Payer: Self-pay

## 2019-03-29 VITALS — BP 164/60 | HR 90 | Temp 97.9°F | Ht 66.0 in | Wt 142.0 lb

## 2019-03-29 DIAGNOSIS — J9611 Chronic respiratory failure with hypoxia: Secondary | ICD-10-CM

## 2019-03-29 DIAGNOSIS — J449 Chronic obstructive pulmonary disease, unspecified: Secondary | ICD-10-CM | POA: Diagnosis not present

## 2019-03-29 MED ORDER — TIOTROPIUM BROMIDE MONOHYDRATE 2.5 MCG/ACT IN AERS: 2.0000 | INHALATION_SPRAY | Freq: Every day | RESPIRATORY_TRACT | 0 refills | Status: DC

## 2019-03-29 MED ORDER — BUDESONIDE-FORMOTEROL FUMARATE 160-4.5 MCG/ACT IN AERO: 2.0000 | INHALATION_SPRAY | Freq: Two times a day (BID) | RESPIRATORY_TRACT | 0 refills | Status: DC

## 2019-03-29 NOTE — Progress Notes (Signed)
Paul [name], male    DOB: March 07, 1946,    MRN: [id number]   Brief patient profile:  79 yowm quit smoking  02/03/09  with GOLD III spirometry baseline doe x able to golf. Only using 02 at  [facility] with  maint on symbicort/ spiriva and no chronic pred/ albuterol twice daily at most  much worse mid March 2020.   PFT 05/29/16: FVC 2.08 L (86%) FEV1 0.83 L (31%) FEV1/FVC 0.40 FEF 25-75 0.30 L (14%) no bronchodilator response TLC 6.56 L (107%) RV 167% ERV 160% DLCO corrected 60% (hemoglobin 10.9) 07/15/13: FVC 2.68 L (60%) FEV1 1.63 L (53%) FEV1/FVC 0.61 FEF 25-75 0.76 L (26%) 02/04/12: FVC 2.18 L (57%) FEV1 1.09 L (36%) FEV1/FVC 0.50 FEF 25-75 0.33 L (11%)   Admit date: 03/19/2019 Discharge date: 03/25/2019   Recommendations for Outpatient Follow-up:  1. F/u with PCP within a week  for hospital discharge follow up, repeat cbc/bmp at follow up 2. F/u with cardiology for new diagnosis of afib 3. Home 02  Discharge Diagnoses:      Active Hospital Problems   Diagnosis Date Noted   PAF (paroxysmal atrial fibrillation) (HCC)    Acute on chronic respiratory failure with hypoxia (HCC)    COPD exacerbation ([place]) 03/19/2019         Filed Weights   03/23/19 1700 03/24/19 0503 03/25/19 0528  Weight: 60.2 kg 60 kg 62.3 kg    History of present illness: (per admitting MD Dr [name]) PCP:[name], Paul Maxwell, MD Patient coming from:Home  Chief Complaint:Worsening shortness of breath  LOV:[name] a 73 y.o.malewith history h/oCOPD, hyperlipidemia whoat baseline was using 2 L O2 via nasal cannula only at night and follows [facility] (Dr. Vaughan [name]) as outpatient presents with worsening shortness of breath. Patient reports that at baseline he has cough with white phlegm and is able to walk around doing his ADLs without shortness of breath and uses O2 only at night. 2 weeks back he developed productive cough with green phlegm and shortness of breath but no fevers. He underwent  CT chest with contrast on March 18 which was negative for PE or infiltrates. He apparently was given doxycycline/prednisone course at that time which helped him transiently but symptoms worsened again past Monday with shortness of breath on minimum exertion/cough and he noticed his pulse ox was dropping to 74%.He started using 2 L O2 nasal cannula continuous. On Tuesday, he called pulmonary office given persistent symptoms and also pulse oximetry showing O2 sats84% on exertion and 93% at rest in spite of using continuous O2. He was advised by Dr. Vaughan [name] to use 60 mg prednisone x3 days followed by 10 mg taper every 3 days. He was also advised to use nebulizer treatments every 3 hours and continue Symbicort inhaler twice daily. Patient was supposed to start 50 mg prednisone today but woke up feeling extremely short of breath. He states he is also been having bad bouts of cough at night with wheezing and bronchospasm.  He presented to [name] where he was noted to be afebrile but hypoxic, required BiPAP for 3 hours, chest x-ray was negative for infiltrates. Patient denies any recent history of travel or known sick contacts with flu positive or coronavirus positive patients. He lives at home with his wife who is recovering from lung cancer and completed chemotherapy 1 month back.  Hospitalist service was called to request direct admission to medical floor but ED to ED transfer facilitated for thorough clinical evaluation prior  to requesting medical bed given coronavirus pandemic. In the ED at [facility], patient has been saturating well on 3 L nasal cannula but appears tachypneic on talking full sentences. He denies any chest pain, denies any fevers or leg swellings.  Hospital Course:  Active Problems:   COPD exacerbation (HCC)   PAF (paroxysmal atrial fibrillation) (HCC)   Acute on chronic respiratory failure with hypoxia (HCC)   Acute onHypoxic respiratory failure,  COPDexacerbation,Progression of severe COPD -Emphysema, with no evidence of superimposed acute cardiopulmonary disease -improving on iv steroids, oral zitrhomax, he finished total of 5 days of zithromax in the hospital, he is discharged on slow prednisone taper.  - o2 dropped to 86% while ambulating on room air, discharged on continuous home o2 ( previously on nighttime o2 only) -he is to follow with Dr Paul [name]    Paroxsymalafib, New diagnosis of afib -he is started on eliquis (CHADS-VASC 2, age and aortic atherosclerosis), cardizem, lasix -cardiology consulted recommend "Diltiazem CD 360. Avoid amiodarone and beta blockers given his underlying lung dz." -outpatient echo per cardiology   Aortic and multivessel coronary atherosclerosis - Changed to high intensity statin, atorvastatin 40mg  PO QD. Check lipids and ALT as outpatient in 3 months.  - ASA 81. Aggressive secondary prevention. - No chest pain.      History of Present Illness  03/29/2019  Pulmonary/ extended post hosp f/u eval/[name]  Re GOLD III copd ? 02 dep now Chief Complaint  Patient presents with   Hospitalization Follow-up    Breathing has improved some. He has not used his albuterol inhaler or neb since hospital d/c.  Dyspnea:  mb and back ok off 02 sats upper 80s at end  Cough: none Sleep: recliner x 45 degrees = baseline 02 2lpm hs  SABA use: none  Since d/c on symb 160 2bid and spiriva each pm (technique 50% on both, see a/p) On pred 50 mg daily on day as ov  No obvious day to day or daytime variability or assoc excess/ purulent sputum or mucus plugs or hemoptysis or cp or chest tightness, subjective wheeze or overt sinus or hb symptoms.   Sleeping as above  without nocturnal  or early am exacerbation  of respiratory  c/o's or need for noct saba. Also denies any obvious fluctuation of symptoms with weather or environmental changes or other aggravating or alleviating factors except as outlined above   No  unusual exposure hx or h/o childhood pna/ asthma or knowledge of premature birth.  Current Allergies, Complete Past Medical History, Past Surgical History, Family History, and Social History were reviewed in [employer] record.  ROS  The following are not active complaints unless bolded Hoarseness, sore throat, dysphagia, dental problems, itching, sneezing,  nasal congestion or discharge of excess mucus or purulent secretions, ear ache,   fever, chills, sweats, unintended wt loss or wt gain, classically pleuritic or exertional cp,  orthopnea pnd or arm/hand swelling  or leg swelling, presyncope, palpitations, abdominal pain, anorexia, nausea, vomiting, diarrhea  or change in bowel habits or change in bladder habits, change in stools or change in urine, dysuria, hematuria,  rash, arthralgias, visual complaints, headache, numbness, weakness or ataxia or problems with walking or coordination,  change in mood or  memory.           Past Medical History:  Diagnosis Date   COPD (chronic obstructive pulmonary disease) ([place])    High cholesterol     Outpatient Medications Prior to Visit  Medication Sig Dispense  Refill   albuterol (PROAIR HFA) 108 (90 Base) MCG/ACT inhaler Inhale 2 puffs into the lungs every 6 (six) hours as needed for wheezing or shortness of breath.     albuterol (PROVENTIL) (2.5 MG/3ML) 0.083% nebulizer solution Take 3 mLs (2.5 mg total) by nebulization every 6 (six) hours as needed for wheezing or shortness of breath. (Patient taking differently: Take 2.5 mg by nebulization every 3 (three) hours. ) 75 mL 12   apixaban (ELIQUIS) 5 MG TABS tablet Take 1 tablet (5 mg total) by mouth 2 (two) times daily. Please call cardiology for refills 60 tablet 0   aspirin EC 81 MG tablet Take 81 mg by mouth daily after supper.     atorvastatin (LIPITOR) 40 MG tablet Take 1 tablet (40 mg total) by mouth daily after supper. 30 tablet 0   budesonide-formoterol (SYMBICORT)  160-4.5 MCG/ACT inhaler Inhale 2 puffs into the lungs 2 (two) times daily. 1 Inhaler 0   diltiazem (CARDIZEM CD) 360 MG 24 hr capsule Take 1 capsule (360 mg total) by mouth daily. 60 capsule 0   fish oil-omega-3 fatty acids 1000 MG capsule Take 1 g by mouth daily after supper.      furosemide (LASIX) 20 MG tablet Take 1 tablet (20 mg total) by mouth daily. 30 tablet 0   loratadine (CLARITIN) 10 MG tablet Take 10 mg by mouth daily.     OXYGEN Inhale 2 L into the lungs continuous.     predniSONE (DELTASONE) 10 MG tablet Take 60mg  for 3 days, 50mg  for 3 days, 40mg  for 3 days, 30mg  for 3 days, 20mg  for 3 days, 10mg  for 3 days and stop (Patient taking differently: Take 10-60 mg by mouth See admin instructions. Tapered course ordered 03/16/19: Take 6 tablets (60mg ) by mouth daily for 3 days, then take 5 tablets (50mg ) daily for 3 days, then take 4 tablets (40mg ) daily for 3 days, then take 3 tablets (30mg ) daily for 3 days, then take 2 tablets (20mg ) daily for 3 days, then take 1 tablet (10mg ) daily for 3 days, then stop) 63 tablet 0   Respiratory Therapy Supplies (FLUTTER) DEVI Use 2-3 times daily 1 each 0   Tiotropium Bromide Monohydrate (SPIRIVA RESPIMAT) 2.5 MCG/ACT AERS Inhale 2 puffs into the lungs daily. (Patient taking differently: Inhale 2 puffs into the lungs daily at 3 pm. ) 1 Inhaler 0   benzonatate (TESSALON) 100 MG capsule Take 1 capsule (100 mg total) by mouth every 8 (eight) hours. 21 capsule 0   budesonide-formoterol (SYMBICORT) 160-4.5 MCG/ACT inhaler Inhale 2 puffs into the lungs 2 (two) times daily. 1 Inhaler 0      Objective:     BP (!) 164/60 (BP Location: Left Arm, Cuff Size: Normal)    Pulse 90    Temp 97.9 F (36.6 C) (Oral)    Ht 5\' 6"  (1.676 m)    Wt 142 lb (64.4 kg)    SpO2 100%    BMI 22.92 kg/m   SpO2: 100 % O2 Type: Continuous O2 O2 Flow Rate (L/min): 2 L/min     HEENT: nl dentition / oropharynx. Nl external ear canals without cough reflex -  Mild  bilateral non-specific turbinate edema     NECK :  without JVD/Nodes/TM/ nl carotid upstrokes bilaterally   LUNGS: no acc muscle use,  Mod barrel  contour chest wall with bilateral  Distant bs s audible wheeze and  without cough on insp or exp maneuver and mod  Hyperresonant  to  percussion bilaterally     CV:  RRR  no s3 or murmur or increase in P2, and trace ankle edema sym bilaterally    ABD:  soft and nontender with pos mid  insp Hoover's  in the supine position. No bruits or organomegaly appreciated, bowel sounds nl  MS:   Nl gait/  ext warm without deformities, calf tenderness, cyanosis or clubbing No obvious joint restrictions   SKIN: warm and dry without lesions    NEURO:  alert, approp, nl sensorium with  no motor or cerebellar deficits apparent.      I personally reviewed images and agree with radiology impression as follows:  Chest CT w/o contrast 03/22/19 No acute CT finding, with no CT evidence of pneumonia or bronchitis. Paraseptal and centrilobular emphysema.      Assessment   No problem-specific Assessment & Plan notes found for this encounter.     Christinia Gully, MD 03/29/2019

## 2019-03-29 NOTE — Patient Instructions (Addendum)
Plan A = Automatic = symbicort 160 Take 2 puffs first thing in am and then another 2 puffs about 12 hours later.  Spiriva x 2 puffs  right after symbicort   Work on inhaler technique:  relax and gently blow all the way out then take a nice smooth deep breath back in, triggering the inhaler at same time you start breathing in.  Hold for up to 5 seconds if you can. Blow out symbicort thru nose. Rinse and gargle with water when done    Plan B = Backup Only use your albuterol inhaler(Proventil/Proair)  as a rescue medication to be used if you can't catch your breath by resting or doing a relaxed purse lip breathing pattern.  - The less you use it, the better it will work when you need it. - Ok to use the inhaler up to 2 puffs  every 4 hours if you must but call for appointment if use goes up over your usual need - Don't leave home without it !!  (think of it like the spare tire for your car)    Plan C = Crisis - only use your albuterol nebulizer if you first try Plan B and it fails to help > ok to use the nebulizer up to every 4 hours but if start needing it regularly call for immediate appointment   Plan D = Doctor - call me if B and C not adequate  Plan E = ER - go to ER or call 911 if all else fails     02  2lpm at bedtime and adjust to keep it over 90% with activity    We will set you up with a televisit in 2 weeks with one of our NP's - call us sooner if needed

## 2019-03-30 ENCOUNTER — Encounter: Payer: Self-pay | Admitting: Internal Medicine

## 2019-03-30 NOTE — Assessment & Plan Note (Signed)
Quit smoking 2020 02/04/12 [name]: FEV1 36% FVC 57% FEF 25 75    11% -05/29/16  FEV1 0.83 L (31%) FEV1/FVC 0.40 FEF 25-75 0.30 L (14%) no bronchodilator response p ? rx prior, DLCO corrected 60% (hemoglobin 10.9) - 03/29/2019  After extensive coaching inhaler device,  effectiveness =    75% with hfa and smi > continue symb 160 2bid and spiriva smi 2 puffs each am   Clearly  Group D in terms of symptom/risk and laba/lama/ICS  therefore appropriate rx at this point >>>  No change in rx needed but needs to work harder on technique/ timing of maint rx  And also contingencies using an ABCDE format to prevent future admits.   Will arrange f/u with NP / televisit  In 2 weeks at which point he will have tapered off prednisone and if continues to flare can either try daliresp or add a maint dose of prednisone (prefer the latter in thin pts as so many of the thin phenotypes lose further wt on daliresp)

## 2019-03-30 NOTE — Assessment & Plan Note (Addendum)
02/06/12 ONO RA: desat <88% 7 episodes per hour, 66 episodes total 01/27/2013 ONO RA desat < 88% 22min of recording , still needs nocturnal oxygen - 03/25/2019 d/c from admit on 2lpm but not using x at hs     Based on the new guidelines for 02 rx in stable copd, ok to use 02 daytim prn unless PAF acts up or develops some other evidence of complications eg cor pulmoale, polycythemia.     I had an extended discussion with the patient and daugher reviewing all relevant studies completed to date and  lasting 25 minutes of a 40  minute post hosp office visit with pt new to me    re  severe non-specific but potentially very serious refractory respiratory symptoms of uncertain and potentially multiple  etiologies.  See device teaching which extended face to face time for this visit    Each maintenance medication was reviewed in detail including most importantly the difference between maintenance and prns and under what circumstances the prns are to be triggered using an action plan format that is not reflected in the computer generated alphabetically organized AVS.    Please see AVS for specific instructions unique to this office visit that I personally wrote and verbalized to the the pt in detail and then reviewed with pt  by my nurse highlighting any changes in therapy/plan of care  recommended at today's visit.

## 2019-03-31 ENCOUNTER — Other Ambulatory Visit: Payer: Self-pay

## 2019-03-31 NOTE — Patient Outreach (Signed)
[facility]) Care Management  03/31/2019  [facility] [name] Feb 22, 1946 [id number]   EMMI- General Discharge RED ON EMMI ALERT Day # 4 Date: 03/30/2019 Red Alert Reason:  Unfilled prescriptions? Yes  Able to fill today/tomorrow? No  Other questions/problems? Yes    Outreach attempt: spoke with patient.  He is able to verify HIPAA.  He states that he is doing better than he was before he went to the hospital.  Discussed red alerts with patient.  He states that the system did not record his answers correctly.  He states he kept saying one thing and the machine is recording the opposite. He states he has all his medications and that he is now just on his oxygen at night as he was previously.  Discussed with patient taking safety precautions with coronavirus and when to notify physician if condition worsens.  Patient appreciative of care received at [facility] and appreciative of call.  Patient denies any needs at this time.     Plan: RN CM will close case.    Jone Baseman, RN, MSN [facility] Care Management Care Management Coordinator Direct Line [phone] Toll Free: [phone]  Fax: [phone]

## 2019-04-06 ENCOUNTER — Encounter: Payer: Self-pay | Admitting: Cardiology

## 2019-04-06 ENCOUNTER — Other Ambulatory Visit: Payer: Self-pay

## 2019-04-06 ENCOUNTER — Telehealth (INDEPENDENT_AMBULATORY_CARE_PROVIDER_SITE_OTHER): Payer: Medicare Other | Admitting: Cardiology

## 2019-04-06 VITALS — BP 146/67 | HR 79 | Ht 66.0 in | Wt 140.0 lb

## 2019-04-06 DIAGNOSIS — I700 Atherosclerosis of aorta: Secondary | ICD-10-CM | POA: Diagnosis not present

## 2019-04-06 DIAGNOSIS — I2584 Coronary atherosclerosis due to calcified coronary lesion: Secondary | ICD-10-CM

## 2019-04-06 DIAGNOSIS — I480 Paroxysmal atrial fibrillation: Secondary | ICD-10-CM

## 2019-04-06 DIAGNOSIS — Z79899 Other long term (current) drug therapy: Secondary | ICD-10-CM | POA: Diagnosis not present

## 2019-04-06 DIAGNOSIS — Z7901 Long term (current) use of anticoagulants: Secondary | ICD-10-CM | POA: Insufficient documentation

## 2019-04-06 DIAGNOSIS — I2510 Atherosclerotic heart disease of native coronary artery without angina pectoris: Secondary | ICD-10-CM | POA: Diagnosis not present

## 2019-04-06 DIAGNOSIS — J438 Other emphysema: Secondary | ICD-10-CM | POA: Diagnosis not present

## 2019-04-06 MED ORDER — ATORVASTATIN CALCIUM 40 MG PO TABS: 40.0000 mg | ORAL_TABLET | Freq: Every day | ORAL | 3 refills | Status: DC

## 2019-04-06 MED ORDER — FUROSEMIDE 20 MG PO TABS: 20.0000 mg | ORAL_TABLET | Freq: Every day | ORAL | 3 refills | Status: DC

## 2019-04-06 MED ORDER — DILTIAZEM HCL ER COATED BEADS 360 MG PO CP24: 360.0000 mg | ORAL_CAPSULE | Freq: Every day | ORAL | 3 refills | Status: DC

## 2019-04-06 MED ORDER — APIXABAN 5 MG PO TABS: 5.0000 mg | ORAL_TABLET | Freq: Two times a day (BID) | ORAL | 3 refills | Status: DC

## 2019-04-06 NOTE — Progress Notes (Signed)
Virtual Visit via Video Note   This visit type was conducted due to national recommendations for restrictions regarding the COVID-19 Pandemic (e.g. social distancing) in an effort to limit this patient's exposure and mitigate transmission in our community.  Due to his co-morbid illnesses, this patient is at least at moderate risk for complications without adequate follow up.  This format is felt to be most appropriate for this patient at this time.  All issues noted in this document were discussed and addressed.  A limited physical exam was performed with this format.  Please refer to the patient's chart for his consent to telehealth for [facility].   Evaluation Performed:  Follow-up visit  Date:  04/06/2019   ID:  Paul [name], DOB 1946-05-30, MRN [id number]  Patient Location: Home  Provider Location: Home  PCP:  [name], MD  Cardiologist:  Candee Furbish, MD  Electrophysiologist:  None   Chief Complaint: Follow-up paroxysmal atrial fibrillation from the hospital setting  History of Present Illness:    Paul [name] is a 73 y.o. male who presents via audio/video conferencing for a telehealth visit today.    I take care of his wife,[name].  He was admitted to the hospital with significant shortness of breath, severe COPD exacerbation after 1 month of worsening symptoms.  In the emergency department he was found to be in atrial fibrillation with heart rates of 1 10-1 20 and diltiazem 360 mg once a day was used.  Over this past month he had had visits to pulmonary given his worsening shortness of breath.  The atrial fibrillation was new.  Minimal exertion caused shortness of breath.  Going to the bathroom for instance.  He was checking his oxygen saturations at home and they were okay he states.  Denies any chest pain.  No syncope.  No bleeding.  Denied any significant orthopnea.  CT scan and chest x-ray did not appear to have edema present.  However, multivessel coronary atherosclerosis  was noted.  BNP was 38, troponin was normal.  2 L of oxygen was given on discharge. Not using during the day.   No edema now with lasix. None on foot.   Weight this AM 140.   The patient does not have symptoms concerning for COVID-19 infection (fever, chills, cough, or new shortness of breath).    Past Medical History:  Diagnosis Date  . COPD (chronic obstructive pulmonary disease) ([place])   . High cholesterol    Past Surgical History:  Procedure Laterality Date  . CATARACT EXTRACTION Right   . SKIN SURGERY     shoulders and chest     Current Meds  Medication Sig  . albuterol (PROAIR HFA) 108 (90 Base) MCG/ACT inhaler Inhale 2 puffs into the lungs every 6 (six) hours as needed for wheezing or shortness of breath.  Marland Kitchen albuterol (PROVENTIL) (2.5 MG/3ML) 0.083% nebulizer solution Take 3 mLs (2.5 mg total) by nebulization every 6 (six) hours as needed for wheezing or shortness of breath.  Marland Kitchen apixaban (ELIQUIS) 5 MG TABS tablet Take 1 tablet (5 mg total) by mouth 2 (two) times daily.  Marland Kitchen atorvastatin (LIPITOR) 40 MG tablet Take 1 tablet (40 mg total) by mouth daily after supper.  . budesonide-formoterol (SYMBICORT) 160-4.5 MCG/ACT inhaler Inhale 2 puffs into the lungs 2 (two) times daily.  Marland Kitchen diltiazem (CARDIZEM CD) 360 MG 24 hr capsule Take 1 capsule (360 mg total) by mouth daily.  . fish oil-omega-3 fatty acids 1000 MG capsule Take 1 g by  mouth daily after supper.   . furosemide (LASIX) 20 MG tablet Take 1 tablet (20 mg total) by mouth daily.  Marland Kitchen loratadine (CLARITIN) 10 MG tablet Take 10 mg by mouth daily.  . OXYGEN Inhale 2 L into the lungs continuous.  . predniSONE (DELTASONE) 10 MG tablet Take 10 mg by mouth 3 (three) times daily. Patient is taking 3 tablets a day until Friday April 17. Then on Friday he will take 2 tablets a day for 3 days. On Monday the 20th he will take 1 tablet daily for 3 days until course is completed.  Marland Kitchen Respiratory Therapy Supplies (FLUTTER) DEVI Use 2-3 times  daily  . Tiotropium Bromide Monohydrate (SPIRIVA RESPIMAT) 2.5 MCG/ACT AERS Inhale 2 puffs into the lungs daily at 3 pm.  . [DISCONTINUED] apixaban (ELIQUIS) 5 MG TABS tablet Take 1 tablet (5 mg total) by mouth 2 (two) times daily. Please call cardiology for refills  . [DISCONTINUED] aspirin EC 81 MG tablet Take 81 mg by mouth daily after supper.  . [DISCONTINUED] atorvastatin (LIPITOR) 40 MG tablet Take 1 tablet (40 mg total) by mouth daily after supper.  . [DISCONTINUED] diltiazem (CARDIZEM CD) 360 MG 24 hr capsule Take 1 capsule (360 mg total) by mouth daily.  . [DISCONTINUED] furosemide (LASIX) 20 MG tablet Take 1 tablet (20 mg total) by mouth daily.     Allergies:   Patient has no known allergies.   Social History   Tobacco Use  . Smoking status: Former Smoker    Packs/day: 2.00    Years: 52.00    Pack years: 104.00    Types: Cigarettes    Last attempt to quit: 02/03/2009    Years since quitting: 10.1  . Smokeless tobacco: Never Used  . Tobacco comment: Counseled to remain smoke free  Substance Use Topics  . Alcohol use: Yes    Alcohol/week: 0.0 standard drinks    Comment: occ  . Drug use: No     Family Hx: The patient's family history includes Cancer in his paternal uncle; Heart disease in his brother, father, and mother.  ROS:   Please see the history of present illness.    Baseline shortness of breath, no chest pain, no syncope, no bleeding All other systems reviewed and are negative.   Prior CV studies:   The following studies were reviewed today:  PFTs-FEV1 29%  CT of chest-three-vessel coronary artery atherosclerosis as well as aortic atherosclerosis.  Labs/Other Tests and Data Reviewed:    EKG:  An ECG dated 03/21/2019 was personally reviewed today and demonstrated:  Atrial fibrillation 110  Recent Labs: 03/19/2019: ALT 46; B Natriuretic Peptide 38.2 03/22/2019: TSH 2.539 03/25/2019: BUN 21; Creatinine, Ser 0.84; Hemoglobin 11.7; Magnesium 2.0; Platelets  314; Potassium 3.5; Sodium 137   Recent Lipid Panel No results found for: CHOL, TRIG, HDL, CHOLHDL, LDLCALC, LDLDIRECT  Wt Readings from Last 3 Encounters:  04/06/19 140 lb (63.5 kg)  03/29/19 142 lb (64.4 kg)  03/25/19 137 lb 5.6 oz (62.3 kg)     Objective:    Vital Signs:  BP (!) 146/67   Pulse 79   Ht 5\' 6"  (1.676 m)   Wt 140 lb (63.5 kg)   BMI 22.60 kg/m    Well nourished, well developed male in no acute distress. Alert and oriented x3.  Breathing comfortably, normal respiratory effort.  ASSESSMENT & PLAN:    Paroxysmal atrial fibrillation - Was placed on Eliquis secondary to chads vascular score of 2 with age and aortic  atherosclerosis being predominant risk factors.  He was also given diltiazem CD 360 mg once a day.  Both amiodarone and beta-blockers were avoided given his underlying lung disease, COPD. - He converted to normal sinus rhythm on 3/28 in the hospital setting. Doing well, no palps.   Severe COPD with recent acute on chronic hypoxic respiratory failure discharge from the hospital on 03/25/2019 - FEV1 is 29%, severe. Feels well post hospital.  -He was given low-dose Lasix, 40 mg IV which really did not change his dyspnea too much however given perhaps a component of diastolic dysfunction he was discharged on low-dose Lasix 20 mg once a day.  Daily weights were prescribed.  He was 60 kg on discharge, 65 on arrival.  Blood restriction was discussed. Currently 140 pounds, stable.  -Both his CT scan and chest x-ray were personally reviewed from the hospitalization and did not show any edema.  Heart size was normal.  No pericardial effusion. -COVID-19 negative -He is continue to follow-up with pulmonary.  Aortic and multivessel coronary atherosclerosis -Atorvastatin 40 mg was prescribed, high intensity.  On Eliquis. No ASA 81 to minimize bleeding risk.   Aggressive secondary prevention.  He was not having any chest discomfort.  At some point, we will likely perform a  nuclear stress test to evaluate ischemic risk.    COVID-19 Education: The signs and symptoms of COVID-19 were discussed with the patient and how to seek care for testing (follow up with PCP or arrange E-visit).  The importance of social distancing was discussed today.  Time:   Today, I have spent 17 minutes with the patient with telehealth technology discussing the above problems.  Also reviewed medical records, hospital records for 10 min.    Medication Adjustments/Labs and Tests Ordered: Current medicines are reviewed at length with the patient today.  Concerns regarding medicines are outlined above.   Tests Ordered: Orders Placed This Encounter  Procedures  . Basic Metabolic Panel (BMET)  . ECHOCARDIOGRAM COMPLETE    Medication Changes: Meds ordered this encounter  Medications  . apixaban (ELIQUIS) 5 MG TABS tablet    Sig: Take 1 tablet (5 mg total) by mouth 2 (two) times daily.    Dispense:  180 tablet    Refill:  3  . atorvastatin (LIPITOR) 40 MG tablet    Sig: Take 1 tablet (40 mg total) by mouth daily after supper.    Dispense:  90 tablet    Refill:  3    Pt wants to know the cost of this medication if he pays out of pocket instead of applying to his insurance.  Attempting to slow the jump into the "donut hole"  . diltiazem (CARDIZEM CD) 360 MG 24 hr capsule    Sig: Take 1 capsule (360 mg total) by mouth daily.    Dispense:  180 capsule    Refill:  3    Pt wants to know the cost of this medication if he pays out of pocket instead of applying to his insurance.  Attempting to slow the jump into the "donut hole"  . furosemide (LASIX) 20 MG tablet    Sig: Take 1 tablet (20 mg total) by mouth daily.    Dispense:  90 tablet    Refill:  3    Pt wants to know the cost of this medication if he pays out of pocket instead of applying to his insurance.  Attempting to slow the jump into the "donut hole"    Disposition:  Follow  up in 6 month(s)  Signed, Candee Furbish, MD   04/06/2019 10:10 AM    [place]

## 2019-04-06 NOTE — Patient Instructions (Addendum)
Medication Instructions:  Please discontinue your Aspirin. Continue all other medications as listed.  If you need a refill on your cardiac medications before your next appointment, please call your pharmacy.   Lab work: Please have blood work as discussed (BMP) on Thursday. If you have labs (blood work) drawn today and your tests are completely normal, you will receive your results only by: Marland Kitchen MyChart Message (if you have MyChart) OR . A paper copy in the mail If you have any lab test that is abnormal or we need to change your treatment, we will call you to review the results.  Testing/Procedures: Your physician has requested that you have an echocardiogram in 3 months. Echocardiography is a painless test that uses sound waves to create images of your heart. It provides your doctor with information about the size and shape of your heart and how well your heart's chambers and valves are working. This procedure takes approximately one hour. There are no restrictions for this procedure.  You will be contacted to be scheduled for this appointment.  Follow-Up: At [facility], you and your health needs are our priority.  As part of our continuing mission to provide you with exceptional heart care, we have created designated Provider Care Teams.  These Care Teams include your primary Cardiologist (physician) and Advanced Practice Providers (APPs -  Physician Assistants and Nurse Practitioners) who all work together to provide you with the care you need, when you need it. Please follow up with Dr Marlou Porch in 6 months.  Please call in August for an October appointment.  Thank you for choosing [facility]!!

## 2019-04-07 ENCOUNTER — Telehealth: Payer: Self-pay | Admitting: Cardiology

## 2019-04-07 DIAGNOSIS — J9601 Acute respiratory failure with hypoxia: Secondary | ICD-10-CM | POA: Diagnosis not present

## 2019-04-07 DIAGNOSIS — I4819 Other persistent atrial fibrillation: Secondary | ICD-10-CM | POA: Diagnosis not present

## 2019-04-07 DIAGNOSIS — F411 Generalized anxiety disorder: Secondary | ICD-10-CM | POA: Diagnosis not present

## 2019-04-07 NOTE — Telephone Encounter (Signed)
  Wife is calling because Paul [name] is having anxiety attacks and it makes his HR go up and he get SOB. She would like to know if Paul [name] should be on some mild anxiety medication to keep him from having those episodes.

## 2019-04-07 NOTE — Telephone Encounter (Signed)
Advised wife Dr Marlou Porch does not rx medications for anxiety or "nerves" and requests he be evaluated by his PCP.  Wife states understanding and that they will do that.  She thanked me for returning the call and for the information.

## 2019-04-07 NOTE — Telephone Encounter (Signed)
Pt had virtual visit yesterday with Dr. Marlou Porch. This issue was not discussed at his appt.   Pt's wife reports he is getting increasingly more anxious. She reports his anxiety is making him tachycardic which in turn makes him SOB.   HR 116 today and reports BP was "good", Pt's wife reports that pt seems to get "nervous" and then begins to c/o SOB. His lowest oxygen saturation has been high 80's. Pt continues to wear home O2 at night but has not needed it during the day. Pt is hoping Dr. Marlou Porch can prescribe him something for his anxiety.   Will route to [facility] and [employer] for advisement.

## 2019-04-08 ENCOUNTER — Ambulatory Visit: Payer: Medicare Other | Admitting: Cardiology

## 2019-04-08 ENCOUNTER — Other Ambulatory Visit: Payer: Self-pay

## 2019-04-08 ENCOUNTER — Other Ambulatory Visit: Payer: Medicare Other | Admitting: *Deleted

## 2019-04-08 DIAGNOSIS — I700 Atherosclerosis of aorta: Secondary | ICD-10-CM | POA: Diagnosis not present

## 2019-04-08 DIAGNOSIS — I480 Paroxysmal atrial fibrillation: Secondary | ICD-10-CM | POA: Diagnosis not present

## 2019-04-08 DIAGNOSIS — Z79899 Other long term (current) drug therapy: Secondary | ICD-10-CM | POA: Diagnosis not present

## 2019-04-08 LAB — BASIC METABOLIC PANEL
BUN/Creatinine Ratio: 18 (ref 10–24)
BUN: 13 mg/dL (ref 8–27)
CO2: 27 mmol/L (ref 20–29)
Calcium: 9.3 mg/dL (ref 8.6–10.2)
Chloride: 91 mmol/L — ABNORMAL LOW (ref 96–106)
Creatinine, Ser: 0.73 mg/dL — ABNORMAL LOW (ref 0.76–1.27)
GFR calc Af Amer: 106 mL/min/{1.73_m2} (ref >59)
GFR calc non Af Amer: 92 mL/min/{1.73_m2} (ref >59)
Glucose: 106 mg/dL — ABNORMAL HIGH (ref 65–99)
Potassium: 4.6 mmol/L (ref 3.5–5.2)
Sodium: 135 mmol/L (ref 134–144)

## 2019-04-12 ENCOUNTER — Encounter: Payer: Self-pay | Admitting: Nurse Practitioner

## 2019-04-12 ENCOUNTER — Other Ambulatory Visit: Payer: Self-pay

## 2019-04-12 ENCOUNTER — Telehealth: Payer: Self-pay | Admitting: Cardiology

## 2019-04-12 ENCOUNTER — Ambulatory Visit (INDEPENDENT_AMBULATORY_CARE_PROVIDER_SITE_OTHER): Payer: Medicare Other | Admitting: Nurse Practitioner

## 2019-04-12 DIAGNOSIS — J449 Chronic obstructive pulmonary disease, unspecified: Secondary | ICD-10-CM | POA: Diagnosis not present

## 2019-04-12 DIAGNOSIS — I480 Paroxysmal atrial fibrillation: Secondary | ICD-10-CM

## 2019-04-12 MED ORDER — METOPROLOL TARTRATE 25 MG PO TABS: 25.0000 mg | ORAL_TABLET | Freq: Two times a day (BID) | ORAL | 3 refills | Status: DC

## 2019-04-12 MED ORDER — PREDNISONE 10 MG PO TABS: 20.0000 mg | ORAL_TABLET | Freq: Every day | ORAL | 0 refills | Status: DC

## 2019-04-12 NOTE — Progress Notes (Signed)
Virtual Visit via Telephone Note  I connected with Paul [name] on 04/12/19 at  9:00 AM EDT by telephone and verified that I am speaking with the correct person using two identifiers.   I discussed the limitations, risks, security and privacy concerns of performing an evaluation and management service by telephone and the availability of in person appointments. I also discussed with the patient that there may be a patient responsible charge related to this service. The patient expressed understanding and agreed to proceed.   History of Present Illness: 73 year old male former smoker with severe COPD and chronic respiratory failure (2L [place] at night) he was recently followed by Dr. Melvyn Novas.  Patient has a follow-up tele-visit today.  He was last seen by Dr. Melvyn Novas on 03/29/2019 for COPD and chronic respiratory failure.  At that visit Dr. Melvyn Novas placed him on a prednisone taper.  Patient finished the taper this a.m.  He states that since he has been on the lowest dose of prednisone his cough has returned and shortness of breath has returned.  He states that he has had a hard time on 10 mg of prednisone and feels that he needs a higher dose daily.  He has been compliant with Symbicort and albuterol as advised by Dr. Melvyn Novas as his last visit.  He does continue to wear 2 L of O2 at bedtime and with exertion. Denies f/c/s, n/v/d, hemoptysis, PND, leg swelling.     Observations/Objective: Quit smoking 2020 02/04/12 [name]: FEV1 36% FVC 57% FEF 25 75    11% -05/29/16  FEV1 0.83 L (31%) FEV1/FVC 0.40 FEF 25-75 0.30 L (14%) no bronchodilator response p ? rx prior, DLCO corrected 60% (hemoglobin 10.9) - 03/29/2019  After extensive coaching inhaler device,  effectiveness =    75% with hfa and smi > continue symb 160 2bid and spiriva smi 2 puffs each am  02/06/12 ONO RA: desat <88% 7 episodes per hour, 66 episodes total 01/27/2013 ONO RA desat < 88% 35min of recording , still needs nocturnal oxygen - 03/25/2019 d/c from admit on  2lpm but not using x at hs   Assessment and Plan: Discussion: Patient finished the taper this a.m.  He states that since he has been on the lowest dose of prednisone his cough has returned and shortness of breath has returned.  He states that he has had a hard time on 10 mg of prednisone and feels that he needs a higher dose daily.  He has been compliant with Symbicort and albuterol as advised by Dr. Melvyn Novas as his last visit.  He does continue to wear 2 L of O2 at bedtime and with exertion.  We discussed that patient can trial 20 mg of prednisone daily over the next week.  We will have him follow-up with Dr. Melvyn Novas for a tele-visit in 1 week to see how he is doing.  We discussed that patient may need to be on daily prednisone, but hopefully we can wean him down to a lower dose daily.  Patient Instructions  Will start prednisone 20 mg daily for now and hopefully taper down soon to 10 mg daily.  Plan A = Automatic = symbicort 160 Take 2 puffs first thing in am and then another 2 puffs about 12 hours later.  Spiriva x 2 puffs  right after symbicort   Work on inhaler technique:  relax and gently blow all the way out then take a nice smooth deep breath back in, triggering the inhaler at same time  you start breathing in.  Hold for up to 5 seconds if you can. Blow out symbicort thru nose. Rinse and gargle with water when done   Plan B = Backup Only use your albuterol inhaler(Proventil/Proair)  as a rescue medication to be used if you can't catch your breath by resting or doing a relaxed purse lip breathing pattern.  - The less you use it, the better it will work when you need it. - Ok to use the inhaler up to 2 puffs  every 4 hours if you must but call for appointment if use goes up over your usual need - Don't leave home without it !!  (think of it like the spare tire for your car)    Plan C = Crisis - only use your albuterol nebulizer if you first try Plan B and it fails to help > ok to use the  nebulizer up to every 4 hours but if start needing it regularly call for immediate appointment   Plan D = Doctor - call me if B and C not adequate  Plan E = ER - go to ER or call 911 if all else fails     02  2lpm at bedtime and adjust to keep it over 90% with activity    Follow Up Instructions:  Follow up with Dr. Melvyn Novas in 1 week or sooner if needed    I discussed the assessment and treatment plan with the patient. The patient was provided an opportunity to ask questions and all were answered. The patient agreed with the plan and demonstrated an understanding of the instructions.   The patient was advised to call back or seek an in-person evaluation if the symptoms worsen or if the condition fails to improve as anticipated.  I provided 22 minutes of non-face-to-face time during this encounter.   Fenton Foy, NP

## 2019-04-12 NOTE — Telephone Encounter (Signed)
Lets also give him metoprolol tartrate 25 mg twice daily. With his underlying coronary artery severe calcifications as well as severe COPD our options for antiarrhythmic seem to be limited.  If it is possible, I would like for him to have an atrial fibrillation clinic consultation to think about other potential options.  Candee Furbish, MD

## 2019-04-12 NOTE — Telephone Encounter (Signed)
° °  STAT if HR is under 50 or over 120 (normal HR is 60-100 beats per minute)  1) What is your heart rate? 106  2) Do you have a log of your heart rate readings (document readings)? 130, 150, 93  3) Do you have any other symptoms? Spouse calling with concerns of HR fluctuation

## 2019-04-12 NOTE — Patient Instructions (Signed)
Will start prednisone 20 mg daily for now and hopefully taper down soon to 10 mg daily.  Plan A = Automatic = symbicort 160 Take 2 puffs first thing in am and then another 2 puffs about 12 hours later.  Spiriva x 2 puffs  right after symbicort   Work on inhaler technique:  relax and gently blow all the way out then take a nice smooth deep breath back in, triggering the inhaler at same time you start breathing in.  Hold for up to 5 seconds if you can. Blow out symbicort thru nose. Rinse and gargle with water when done    Plan B = Backup Only use your albuterol inhaler(Proventil/Proair)  as a rescue medication to be used if you can't catch your breath by resting or doing a relaxed purse lip breathing pattern.  - The less you use it, the better it will work when you need it. - Ok to use the inhaler up to 2 puffs  every 4 hours if you must but call for appointment if use goes up over your usual need - Don't leave home without it !!  (think of it like the spare tire for your car)    Plan C = Crisis - only use your albuterol nebulizer if you first try Plan B and it fails to help > ok to use the nebulizer up to every 4 hours but if start needing it regularly call for immediate appointment   Plan D = Doctor - call me if B and C not adequate  Plan E = ER - go to ER or call 911 if all else fails     02  2lpm at bedtime and adjust to keep it over 90% with activity

## 2019-04-12 NOTE — Telephone Encounter (Signed)
Spoke with wife and reviewed new orders.  RX to be sent into [facility]. 90 day supply X 3. Aware order will be placed for the [facility] consult and they will be contacted to schedule.

## 2019-04-12 NOTE — Assessment & Plan Note (Signed)
Discussion: Patient finished the taper this a.m.  He states that since he has been on the lowest dose of prednisone his cough has returned and shortness of breath has returned.  He states that he has had a hard time on 10 mg of prednisone and feels that he needs a higher dose daily.  He has been compliant with Symbicort and albuterol as advised by Dr. Melvyn Novas as his last visit.  He does continue to wear 2 L of O2 at bedtime and with exertion.  We discussed that patient can trial 20 mg of prednisone daily over the next week.  We will have him follow-up with Dr. Melvyn Novas for a tele-visit in 1 week to see how he is doing.  We discussed that patient may need to be on daily prednisone, but hopefully we can wean him down to a lower dose daily.  Patient Instructions  Will start prednisone 20 mg daily for now and hopefully taper down soon to 10 mg daily.  Plan A = Automatic = symbicort 160 Take 2 puffs first thing in am and then another 2 puffs about 12 hours later.  Spiriva x 2 puffs  right after symbicort   Work on inhaler technique:  relax and gently blow all the way out then take a nice smooth deep breath back in, triggering the inhaler at same time you start breathing in.  Hold for up to 5 seconds if you can. Blow out symbicort thru nose. Rinse and gargle with water when done    Plan B = Backup Only use your albuterol inhaler(Proventil/Proair)  as a rescue medication to be used if you can't catch your breath by resting or doing a relaxed purse lip breathing pattern.  - The less you use it, the better it will work when you need it. - Ok to use the inhaler up to 2 puffs  every 4 hours if you must but call for appointment if use goes up over your usual need - Don't leave home without it !!  (think of it like the spare tire for your car)    Plan C = Crisis - only use your albuterol nebulizer if you first try Plan B and it fails to help > ok to use the nebulizer up to every 4 hours but if start needing it  regularly call for immediate appointment   Plan D = Doctor - call me if B and C not adequate  Plan E = ER - go to ER or call 911 if all else fails     02  2lpm at bedtime and adjust to keep it over 90% with activity   Follow up: Follow up with Dr. Melvyn Novas in 1 week or sooner if needed

## 2019-04-12 NOTE — Telephone Encounter (Signed)
Spoke with wife [name], pt has had a "bad weekend with his breathing."   Wife reports HR has been elevated throughout the weekend and pt has had more problems with SOB.  They just spoke with pulmonary who increased pt prednisone to 20 mg daily.  He had been on a tapering dose.  At present BP 143/55 HR 109.  Pt can not determine if he is in At Fib or if the heart beat is irregular.  His HR this morning has been anywhere between 90 and 150 bpm.  Pt took his Diltiazem 360 mg at 9 am which is when he usually takes it.  Advised I will review with MD and call back with further orders/recommendations.

## 2019-04-13 ENCOUNTER — Encounter (HOSPITAL_COMMUNITY): Payer: Self-pay

## 2019-04-13 ENCOUNTER — Ambulatory Visit (HOSPITAL_COMMUNITY)
Admission: RE | Admit: 2019-04-13 | Discharge: 2019-04-13 | Disposition: A | Discharge status: Home / Self Care | Payer: Medicare Other | Source: Ambulatory Visit | Attending: Nurse Practitioner | Admitting: Nurse Practitioner

## 2019-04-13 ENCOUNTER — Encounter (HOSPITAL_COMMUNITY): Payer: Self-pay | Admitting: Nurse Practitioner

## 2019-04-13 VITALS — BP 143/64 | HR 92 | Ht 66.0 in | Wt 137.0 lb

## 2019-04-13 DIAGNOSIS — I480 Paroxysmal atrial fibrillation: Secondary | ICD-10-CM

## 2019-04-13 DIAGNOSIS — I4891 Unspecified atrial fibrillation: Secondary | ICD-10-CM

## 2019-04-13 NOTE — Progress Notes (Signed)
Electrophysiology TeleHealth Note   Due to national recommendations of social distancing due to [place] 19, Audio/video telehealth visit is felt to be most appropriate for this patient at this time.  See MyChart message/consent belowfrom today for patient consent regarding telehealth for the [facility].    Date:  04/13/2019   ID:  Paul [name], DOB 02/12/1946, MRN [id number]  Location: home Provider location: [address] [place], [place] [address] Evaluation Performed: New patient consult  PCP:  [name], MD  Primary Cardiologist:  Dr. Marlou Porch Primary Electrophysiologist: None   CC:New onset AIFB   History of Present Illness: Paul [name] is a 73 y.o. male who presents via video conferencing for a telehealth visit today.   The patient is referred for new consultation regarding AFIB by Dr Marlou Porch.  Pt was hospitalized 3/27- 4/2 for COPD exacerbation, former long term smoker, acute on chronic respiratory failure with hypoxemia and newly diagnosed afib. He was started on diltiazem 360 mg daily and eliquis 5 mg bid. It was suggested to avoid amiodarone and BB 2/2 lung disease. He was CoV 19 negative. He was seen by Dr. Marlou Porch by tele visit 4/1 and was felt to be stable.  Today, he reports that he started seeing some HR's yesterday up to 150 bpm with exertion and called Dr. Marlou Porch office and was ordered metoprolol tartrate  25 mg bid.He was more winded with exertion yesterday and PO  with exertions would drop in the 80's. Since starting on BB, he feels much improved today. HR in the 80's today at baseline.BP 143/64. He is not sure if he is in afib persistently as he has good/bad days. He is now on eliquis without a missed dose for over 3 weeks so he would be positioned for a DCCV if he is persistent and if covid19 restrictions for elective procedures are relaxed.  He is on lasix but is not weighting daily. He did have LLE prior to hospitalization but not now. He has felt more  comfortable sleeping in a recliner for several nights . His wife states mid March he was about to walk around the block but some days now has difficulty going to the bathroom without shortness of breath. He is wearing 02 via [place] at 2L 24/7 now. Out pt echo is pending. 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   Today, he denies symptoms of palpitations, chest pain, lower extremity edema, claudication, dizziness, presyncope, syncope, bleeding, or neurologic sequela. + for dyspnea with exertion. The patient is tolerating medications without difficulties and is otherwise without complaint today.   he denies symptoms of cough, fevers, chills, or new SOB worrisome for COVID 19.    Atrial Fibrillation Risk Factors:  he does not have symptoms or diagnosis of sleep apnea. he does not have a history of rheumatic fever. he does not have a history of alcohol use. The patient does not have a history of early familial atrial fibrillation or other arrhythmias.  he has a BMI of Body mass index is 22.11 kg/m.Marland Kitchen Filed Weights   04/13/19 0935  Weight: 62.1 kg    Past Medical History:  Diagnosis Date  . COPD (chronic obstructive pulmonary disease) ([facility])   . High cholesterol    Past Surgical History:  Procedure Laterality Date  . CATARACT EXTRACTION Right   . SKIN SURGERY     shoulders and chest     Current Outpatient Medications  Medication Sig Dispense Refill  . albuterol (PROAIR HFA) 108 (90 Base) MCG/ACT inhaler Inhale 2 puffs into the lungs every 6 (six) hours as needed for wheezing or shortness of breath.    Marland Kitchen albuterol (PROVENTIL) (2.5 MG/3ML) 0.083% nebulizer solution Take 3 mLs (2.5 mg total) by nebulization every 6 (six) hours as needed for wheezing or shortness of breath. 75 mL 12  . apixaban (ELIQUIS) 5 MG TABS tablet Take 1 tablet (5 mg total) by mouth 2 (two) times daily. 180 tablet 3  . atorvastatin  (LIPITOR) 40 MG tablet Take 1 tablet (40 mg total) by mouth daily after supper. 90 tablet 3  . budesonide-formoterol (SYMBICORT) 160-4.5 MCG/ACT inhaler Inhale 2 puffs into the lungs 2 (two) times daily. 1 Inhaler 0  . diltiazem (CARDIZEM CD) 360 MG 24 hr capsule Take 1 capsule (360 mg total) by mouth daily. 180 capsule 3  . fish oil-omega-3 fatty acids 1000 MG capsule Take 1 g by mouth daily after supper.     . furosemide (LASIX) 20 MG tablet Take 1 tablet (20 mg total) by mouth daily. 90 tablet 3  . loratadine (CLARITIN) 10 MG tablet Take 10 mg by mouth daily.    Marland Kitchen LORazepam (ATIVAN) 0.5 MG tablet 1 tablet as needed.    . metoprolol tartrate (LOPRESSOR) 25 MG tablet Take 1 tablet (25 mg total) by mouth 2 (two) times daily. 180 tablet 3  . OXYGEN Inhale 2 L into the lungs continuous.    . predniSONE (DELTASONE) 10 MG tablet Take 2 tablets (20 mg total) by mouth daily with breakfast for 30 days. 60 tablet 0  . Respiratory Therapy Supplies (FLUTTER) DEVI Use 2-3 times daily 1 each 0  . sertraline (ZOLOFT) 50 MG tablet Take 50 mg by mouth daily.    . Tiotropium Bromide Monohydrate (SPIRIVA RESPIMAT) 2.5 MCG/ACT AERS Inhale 2 puffs into the lungs daily at 3 pm. 1 Inhaler 0   No current facility-administered medications for this encounter.     Allergies:   Patient has no known allergies.   Social History:  The patient  reports that he quit smoking about 10 years ago. His smoking use included cigarettes. He has a 104.00 pack-year smoking history. He has never used smokeless tobacco. He reports current alcohol use. He reports that he does not use drugs.   Family History:  The patient's  family history includes Cancer in his paternal uncle; Heart disease in his brother, father, and mother.    ROS:  Please see the history of present illness.   All other systems are personally reviewed and negative.   Exam: Well appearing, alert and conversant, regular work of breathing,  good skin color, wearing  O2 via [place]  Recent Labs: 03/19/2019: ALT 46; B Natriuretic Peptide 38.2 03/22/2019: TSH 2.539 03/25/2019: Hemoglobin 11.7; Magnesium 2.0; Platelets 314 04/08/2019: BUN 13; Creatinine, Ser 0.73; Potassium 4.6; Sodium 135  personally reviewed    Other studies personally reviewed: Epic records reviewed x 15 mins today prior to appointment     ASSESSMENT AND PLAN:  1.  New onset atrial fibrillation General education re afib I am not sure if pt is in persistent or paroxysmal afib at this point The pt feels paroxysmal but by his reported exertional dyspnea, and his ability to walk around the neighborhood one month ago, I am leaning toward persistent afib I will place a  one week zio patch to confirm. It will be sent to his house with simple instructions to apply.  In the interim, he was reminded not to miss any doses of anticoagulation and if afib  persistent, when CoVid 19 restrictions are relaxed will try to schedule for DCCV. Possibly with Dr. Marlou Porch if can be arranged.  We also discussed admission for tikosyn, if this should prove the best approach His wife will go ahead and check on price. will also have to wait for elective admission restrictions to be lifted He appears to be tolerating well the BB that was added yesterday and will contiue BB/CCb at current doses continue eliquis 5 mg bid IF shortness of breath worsens to ER He was advised to weigh daily and report 3-5 lb weight gain in a short period of time    This patients CHA2DS2-VASc Score and unadjusted Ischemic Stroke Rate (% per year) is equal to 2.2 % stroke rate/year from a score of 2  Above score calculated as 1 point each if present [CHF, HTN, DM, Vascular=MI/PAD/Aortic Plaque, Age if 65-74, or Male] Above score calculated as 2 points each if present [Age > 75, or Stroke/TIA/TE]    COVID screen The patient does not have any symptoms that suggest any further testing/ screening at this time.  Social distancing reinforced  today.    Follow-up:  Per monitor results  Current medicines are reviewed at length with the patient today.   The patient does not have concerns regarding his medicines.  The following changes were made today:  none  Labs/ tests ordered today include: none Orders Placed This Encounter  Procedures  . LONG TERM MONITOR (3-14 DAYS)    Patient Risk:  after full review of this patients clinical status, I feel that they are at high risk at this time.   Today, I have spent  40 minutes( plus epic review) with the patient with telehealth technology discussing afib   Signed, Roderic Palau NP  04/13/2019 10:55 AM  Afib [facility] [place], [place] [address] [phone]   I hereby voluntarily request, consent and authorize the [facility] and its employed or contracted physicians, physician assistants, nurse practitioners or other licensed health care professionals (the Practitioner), to provide me with telemedicine health care services (the "Services") as deemed necessary by the treating Practitioner. I acknowledge and consent to receive the Services by the Practitioner via telemedicine. I understand that the telemedicine visit will involve communicating with the Practitioner through live audiovisual communication technology and the disclosure of certain medical information by electronic transmission. I acknowledge that I have been given the opportunity to request an in-person assessment or other available alternative prior to the telemedicine visit and am voluntarily participating in the telemedicine visit.   I understand that I have the right to withhold or withdraw my consent to the use of telemedicine in the course of my care at any time, without affecting my right to future care or treatment, and that the Practitioner or I may terminate the telemedicine visit at any time. I understand that I have the right to inspect all information obtained  and/or recorded in the course of the telemedicine visit and may receive copies of available information for a reasonable fee.  I understand that some of the potential risks of receiving the Services via telemedicine include:   Delay or interruption in medical evaluation due to technological equipment failure or disruption;  Information transmitted may not be sufficient (e.g. poor resolution of images) to allow for appropriate medical decision making by the Practitioner; and/or  In rare instances,  security protocols could fail, causing a breach of personal health information.   Furthermore, I acknowledge that it is my responsibility to provide information about my medical history, conditions and care that is complete and accurate to the best of my ability. I acknowledge that Practitioner's advice, recommendations, and/or decision may be based on factors not within their control, such as incomplete or inaccurate data provided by me or distortions of diagnostic images or specimens that may result from electronic transmissions. I understand that the practice of medicine is not an exact science and that Practitioner makes no warranties or guarantees regarding treatment outcomes. I acknowledge that I will receive a copy of this consent concurrently upon execution via email to the email address I last provided but may also request a printed copy by calling the office of the [facility].  I understand that my insurance will be billed for this visit.   I have read or had this consent read to me.  I understand the contents of this consent, which adequately explains the benefits and risks of the Services being provided via telemedicine.  I have been provided ample opportunity to ask questions regarding this consent and the Services and have had my questions answered to my satisfaction.  I give my informed consent for the services to be provided through the use of telemedicine in my medical care  By  participating in this telemedicine visit I agree to the above.

## 2019-04-14 ENCOUNTER — Telehealth: Payer: Self-pay | Admitting: Internal Medicine

## 2019-04-14 ENCOUNTER — Telehealth (HOSPITAL_COMMUNITY): Payer: Self-pay | Admitting: *Deleted

## 2019-04-14 ENCOUNTER — Emergency Department (HOSPITAL_COMMUNITY)
Admission: EM | Admit: 2019-04-14 | Discharge: 2019-04-15 | Disposition: A | Discharge status: Home / Self Care | Payer: Medicare Other | Attending: Emergency Medicine | Admitting: Emergency Medicine

## 2019-04-14 ENCOUNTER — Other Ambulatory Visit: Payer: Self-pay

## 2019-04-14 ENCOUNTER — Encounter (HOSPITAL_COMMUNITY): Payer: Self-pay

## 2019-04-14 ENCOUNTER — Emergency Department (HOSPITAL_COMMUNITY): Payer: Medicare Other

## 2019-04-14 DIAGNOSIS — R5383 Other fatigue: Secondary | ICD-10-CM | POA: Diagnosis present

## 2019-04-14 DIAGNOSIS — R079 Chest pain, unspecified: Secondary | ICD-10-CM | POA: Diagnosis not present

## 2019-04-14 DIAGNOSIS — Z87891 Personal history of nicotine dependence: Secondary | ICD-10-CM | POA: Insufficient documentation

## 2019-04-14 DIAGNOSIS — J449 Chronic obstructive pulmonary disease, unspecified: Secondary | ICD-10-CM | POA: Insufficient documentation

## 2019-04-14 DIAGNOSIS — R0602 Shortness of breath: Secondary | ICD-10-CM

## 2019-04-14 DIAGNOSIS — Z79899 Other long term (current) drug therapy: Secondary | ICD-10-CM | POA: Insufficient documentation

## 2019-04-14 DIAGNOSIS — R002 Palpitations: Secondary | ICD-10-CM

## 2019-04-14 HISTORY — DX: Unspecified atrial fibrillation: I48.91

## 2019-04-14 LAB — CBC
HCT: 39.3 % (ref 39.0–52.0)
Hemoglobin: 12.4 g/dL — ABNORMAL LOW (ref 13.0–17.0)
MCH: 25.4 pg — ABNORMAL LOW (ref 26.0–34.0)
MCHC: 31.6 g/dL (ref 30.0–36.0)
MCV: 80.4 fL (ref 80.0–100.0)
Platelets: 352 10*3/uL (ref 150–400)
RBC: 4.89 MIL/uL (ref 4.22–5.81)
RDW: 14.1 % (ref 11.5–15.5)
WBC: 10.7 10*3/uL — ABNORMAL HIGH (ref 4.0–10.5)
nRBC: 0 % (ref 0.0–0.2)

## 2019-04-14 LAB — BASIC METABOLIC PANEL
Anion gap: 11 (ref 5–15)
BUN: 11 mg/dL (ref 8–23)
CO2: 32 mmol/L (ref 22–32)
Calcium: 8.9 mg/dL (ref 8.9–10.3)
Chloride: 87 mmol/L — ABNORMAL LOW (ref 98–111)
Creatinine, Ser: 0.65 mg/dL (ref 0.61–1.24)
GFR calc Af Amer: >60 mL/min (ref >60)
GFR calc non Af Amer: >60 mL/min (ref >60)
Glucose, Bld: 132 mg/dL — ABNORMAL HIGH (ref 70–99)
Potassium: 4.4 mmol/L (ref 3.5–5.1)
Sodium: 130 mmol/L — ABNORMAL LOW (ref 135–145)

## 2019-04-14 LAB — PROTIME-INR
INR: 1.1 (ref 0.8–1.2)
Prothrombin Time: 13.8 seconds (ref 11.4–15.2)

## 2019-04-14 LAB — TROPONIN I: Troponin I: <0.03 ng/mL (ref <0.03)

## 2019-04-14 LAB — BRAIN NATRIURETIC PEPTIDE: B Natriuretic Peptide: 90.4 pg/mL (ref 0.0–100.0)

## 2019-04-14 MED ORDER — ALBUTEROL SULFATE HFA 108 (90 BASE) MCG/ACT IN AERS
2.0000 | INHALATION_SPRAY | Freq: Four times a day (QID) | RESPIRATORY_TRACT | Status: DC
  Administered 2019-04-14: 2 via RESPIRATORY_TRACT
  Filled 2019-04-14: qty 6.7

## 2019-04-14 MED ORDER — DEXAMETHASONE SODIUM PHOSPHATE 10 MG/ML IJ SOLN
10.0000 mg | Freq: Once | INTRAMUSCULAR | Status: AC
  Administered 2019-04-14: 10 mg via INTRAVENOUS
  Filled 2019-04-14: qty 1

## 2019-04-14 MED ORDER — SODIUM CHLORIDE 0.9% FLUSH: 3.0000 mL | Freq: Once | INTRAVENOUS | Status: DC

## 2019-04-14 NOTE — Discharge Instructions (Signed)
As discussed, your evaluation today has been largely reassuring.  But, it is important that you monitor your condition carefully, and do not hesitate to return to the ED if you develop new, or concerning changes in your condition.  Please be sure to keep your appointment with your pulmonologist tomorrow, and discuss today's visit. In particular, discuss your palpitations, dyspnea and review your medication regimen.

## 2019-04-14 NOTE — Telephone Encounter (Signed)
If can get comfortable at rest p rescue rx then ov with all meds in hand If can't get comfortable or having cp, fever, aches go to er

## 2019-04-14 NOTE — ED Triage Notes (Signed)
Pt feeling heart fluttering for 2 days along with [facility].

## 2019-04-14 NOTE — Telephone Encounter (Signed)
Called and spoke with pt's wife Paul [name] who stated pt has had problems of wheezing, coughing, labored breathing x3 days. Pt is using flutter valve but unable to get any mucus up even with using that. Paul [name] stated pt's breathing is labored.  Pt has been on mucinex and also doing neb treatments and did two treatments yesterday 4/21 and has done one treatment today so far.  Paul [name] stated that pt is currently in afib and this is messing with pt's symptoms. Pt had televisit with [facility] 4/20 and he was increased to 20mg  prednisone daily.  Pt is taking symbicort and spiriva as directed.  Pt and Paul [name] want to know recs to help with pt's symptoms. Dr. Melvyn Novas, please advise. Thanks!

## 2019-04-14 NOTE — ED Provider Notes (Signed)
[facility] EMERGENCY DEPARTMENT Provider Note   CSN: [id number] Arrival date & time: 04/14/19  2052    History   Chief Complaint Chief Complaint  Patient presents with  . Atrial Fibrillation    HPI Paul [name] is a 74 y.o. male.     HPI With multiple medical problems including COPD, A. fib presents with concern of palpitations, dyspnea, fatigue. Onset was at least 3 days ago, but patient notes more symptoms today. Symptoms are present with exertion and at rest, with with fatigue and dyspnea, both appreciably worse with activity. Pain is inconsistent, and currently he seems to deny any. He states that he takes his medication as directed, denies any recent changes in dosage or type. He denies fever, cough. He notes that he has been working with his physician due to ongoing concern for palpitations, COPD exacerbation, has been seen recently in hospitals.  Past Medical History:  Diagnosis Date  . A-fib ([place])   . COPD (chronic obstructive pulmonary disease) ([place])   . High cholesterol     Patient Active Problem List   Diagnosis Date Noted  . Chronic anticoagulation 04/06/2019  . Coronary artery calcification 04/06/2019  . Atherosclerosis of aorta ([place]) 04/06/2019  . Paroxysmal atrial fibrillation ([facility])   . Acute on chronic respiratory failure with hypoxia ([place])   . COPD exacerbation ([place]) 03/19/2019  . Lung nodule < 6cm on CT 05/29/2016  . Former cigarette smoker 12/05/2015  . Chronic respiratory failure with hypoxia ([place]) 01/27/2013  . COPD  GOLD III/ 02 hs only    . High cholesterol     Past Surgical History:  Procedure Laterality Date  . CATARACT EXTRACTION Right   . SKIN SURGERY     shoulders and chest        Home Medications    Prior to Admission medications   Medication Sig Start Date End Date Taking? Authorizing Provider  albuterol (PROAIR HFA) 108 (90 Base) MCG/ACT inhaler Inhale 2 puffs into the lungs every 6 (six) hours as  needed for wheezing or shortness of breath.    [provider]  albuterol (PROVENTIL) (2.5 MG/3ML) 0.083% nebulizer solution Take 3 mLs (2.5 mg total) by nebulization every 6 (six) hours as needed for wheezing or shortness of breath. 02/28/19   [name], PA-C  apixaban (ELIQUIS) 5 MG TABS tablet Take 1 tablet (5 mg total) by mouth 2 (two) times daily. 04/06/19   Jerline Pain, MD  atorvastatin (LIPITOR) 40 MG tablet Take 1 tablet (40 mg total) by mouth daily after supper. 04/06/19   Jerline Pain, MD  budesonide-formoterol (SYMBICORT) 160-4.5 MCG/ACT inhaler Inhale 2 puffs into the lungs 2 (two) times daily. 03/29/19   Tanda Rockers, MD  diltiazem (CARDIZEM CD) 360 MG 24 hr capsule Take 1 capsule (360 mg total) by mouth daily. 04/06/19   Jerline Pain, MD  fish oil-omega-3 fatty acids 1000 MG capsule Take 1 g by mouth daily after supper.     [provider]  furosemide (LASIX) 20 MG tablet Take 1 tablet (20 mg total) by mouth daily. 04/06/19   Jerline Pain, MD  loratadine (CLARITIN) 10 MG tablet Take 10 mg by mouth daily.    [provider]  LORazepam (ATIVAN) 0.5 MG tablet 1 tablet as needed. 04/07/19   [provider]  metoprolol tartrate (LOPRESSOR) 25 MG tablet Take 1 tablet (25 mg total) by mouth 2 (two) times daily. 04/12/19   Jerline Pain, MD  OXYGEN Inhale 2 L into the lungs continuous.    [provider]  predniSONE (DELTASONE) 10 MG tablet Take 2 tablets (20 mg total) by mouth daily with breakfast for 30 days. 04/12/19 05/12/19  Fenton Foy, NP  Respiratory Therapy Supplies (FLUTTER) DEVI Use 2-3 times daily 07/04/15   Elsie Stain, MD  sertraline (ZOLOFT) 50 MG tablet Take 50 mg by mouth daily.    [provider]  Tiotropium Bromide Monohydrate (SPIRIVA RESPIMAT) 2.5 MCG/ACT AERS Inhale 2 puffs into the lungs daily at 3 pm. 03/29/19   Tanda Rockers, MD    Family History Family History  Problem Relation Age of Onset   . Heart disease Brother   . Heart disease Mother   . Heart disease Father   . Cancer Paternal Uncle     Social History Social History   Tobacco Use  . Smoking status: Former Smoker    Packs/day: 2.00    Years: 52.00    Pack years: 104.00    Types: Cigarettes    Last attempt to quit: 02/03/2009    Years since quitting: 10.1  . Smokeless tobacco: Never Used  . Tobacco comment: Counseled to remain smoke free  Substance Use Topics  . Alcohol use: Yes    Alcohol/week: 0.0 standard drinks    Comment: occ  . Drug use: No     Allergies   Patient has no known allergies.   Review of Systems Review of Systems  Constitutional:       Per HPI, otherwise negative  HENT:       Per HPI, otherwise negative  Respiratory:       Per HPI, otherwise negative  Cardiovascular:       Per HPI, otherwise negative  Gastrointestinal: Negative for vomiting.  Endocrine:       Negative aside from HPI  Genitourinary:       Neg aside from HPI   Musculoskeletal:       Per HPI, otherwise negative  Skin: Negative.   Neurological: Negative for syncope.     Physical Exam Updated Vital Signs BP (!) 155/84   Pulse 86   Resp (!) 22   SpO2 98%   Physical Exam Vitals signs and nursing note reviewed.  Constitutional:      General: He is not in acute distress.    Appearance: He is well-developed.  HENT:     Head: Normocephalic and atraumatic.  Eyes:     Conjunctiva/sclera: Conjunctivae normal.  Cardiovascular:     Rate and Rhythm: Regular rhythm. Tachycardia present.  Pulmonary:     Effort: Tachypnea present.     Breath sounds: Decreased air movement present. No stridor.  Abdominal:     General: There is no distension.  Skin:    General: Skin is warm and dry.  Neurological:     Mental Status: He is alert and oriented to person, place, and time.      ED Treatments / Results  Labs (all labs ordered are listed, but only abnormal results are displayed) Labs Reviewed  BASIC  METABOLIC PANEL  CBC  [place]  TROPONIN I    EKG EKG Interpretation  Date/Time:  Wednesday April 14 2019 21:04:23 EDT Ventricular Rate:  83 PR Interval:    QRS Duration: 92 QT Interval:  363 QTC Calculation: 427 R Axis:   74 Text Interpretation:  Sinus rhythm Biatrial enlargement Probable anteroseptal infarct, old Artifact Abnormal ekg Confirmed by Carmin Muskrat [phone])  on 04/14/2019 9:09:04 PM   Radiology No results found.  Procedures Procedures (including critical care time)  Medications Ordered in ED Medications  sodium chloride flush (NS) 0.9 % injection 3 mL (has no administration in time range)  albuterol (VENTOLIN HFA) 108 (90 Base) MCG/ACT inhaler 2 puff (has no administration in time range)  dexamethasone (DECADRON) injection 10 mg (has no administration in time range)     Initial Impression / Assessment and Plan / ED Course  I have reviewed the triage vital signs and the nursing notes.  Pertinent labs & imaging results that were available during my care of the patient were reviewed by me and considered in my medical decision making (see chart for details).    After the initial evaluation I reviewed the patient's chart. Notable findings include history of A. fib, COPD, recent evaluation at our affiliated facility with COPD exacerbation. Reportedly the patient is taking his anticoagulant appropriately. Electronic chart reviewed as well, with pertinent findings, including 2 CT scans within the past month of his chest, as below:  IMPRESSION: 1. No pulmonary emboli or acute abnormality. 2. Stable small bilateral lung nodules. Recommend a follow-up low dose screening chest CT in 1 year. 3. Mild changes of COPD and chronic bronchitis. 4. Mild diffuse hepatic steatosis. 5.  Calcific coronary artery and aortic atherosclerosis. 6. 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   Aortic  Atherosclerosis (ICD10-I70.0) and Emphysema (ICD10-J43.9).     Electronically Signed   By: Claudie Revering M.D.   On: 03/10/2019 15:19 IMPRESSION: No acute CT finding, with no CT evidence of pneumonia or bronchitis.   Paraseptal and centrilobular emphysema.   Redemonstration of small lung nodules. Follow-up chest CT is again recommended in 12 months.   Aortic atherosclerosis and coronary artery disease. Aortic Atherosclerosis (ICD10-I70.0).   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     Electronically Signed   By: Corrie Mckusick D.O.   On: 03/22/2019 15:49 Ref Range & Units3wk ago SARS-CoV-2, NAANOT DETECTEDNOT DETECTED Comment: (NOTE)     11:19 PM After 2 breathing treatments, steroids, the patient is resting calmly, has no A. fib, no complaints, no increased work of breathing. We have previously discussed his initial findings, and now review all lab results, x-ray, no evidence for ACS, sustained A. fib, worsening heart failure, some suspicion for COPD exacerbation.  Line with this improvement, the patient will follow-up tomorrow, with his pulmonologist.  When specifically patient will discuss his presentation today, history of A. fib, COPD, need for medication adjustments, to minimize symptoms, as well as for appropriate care. Final Clinical Impressions(s) / ED Diagnoses   Final diagnoses:  Shortness of breath  Palpitations     Carmin Muskrat, MD 04/14/19 2319

## 2019-04-14 NOTE — Telephone Encounter (Signed)
Called & spoke w/ pt's wife in regards to [facility] recommendations. Pt wife verbalized understanding. Wife states pt is on O2 2L continuously and maintains SpO2 in the low 90s at rest and drops to high 80s w/ ambulation. She also mentioned she's been taking pt's temperature and pt has no fever/chills/sweats. Per MW's recommendations, I suggested to pt's wife we set up an ov for pt. She agreed to scheduling ov for tomorrow 04/15/2019 at 9:15 AM w/ MW. Informed her to have pt bring all medications. She expressed understanding.   I also informed pt's wife if pt has significant drop in O2 w/ rest <90%, spike in fever, increased SOB, pt should seek emergency care. Pt wife verbalized understanding.   OV for tomorrow 04/15/2019 has been scheduled. Pt NEG for COVID-19 screening. Pt wife mentioned their daughter will be bringing him tomorrow. I made her aware of our office precautions and that we are currently limiting visitors d/t coronavirus unless pt has deficit and needs caregiver there. Pt wife verbalized understanding. Nothing further needed at this time.

## 2019-04-14 NOTE — Telephone Encounter (Signed)
Pts wife called in stating pt having increased swelling and his weight is up 2lbs from yesterday. No increased work of breathing noted. Will take extra 1/2 tablet of lasix this morning and continue monitoring weights. Pt wife verbalized understanding.

## 2019-04-15 ENCOUNTER — Encounter: Payer: Self-pay | Admitting: Internal Medicine

## 2019-04-15 ENCOUNTER — Other Ambulatory Visit: Payer: Self-pay

## 2019-04-15 ENCOUNTER — Ambulatory Visit (INDEPENDENT_AMBULATORY_CARE_PROVIDER_SITE_OTHER): Payer: Medicare Other | Admitting: Internal Medicine

## 2019-04-15 VITALS — BP 124/58 | HR 80 | Temp 98.0°F | Ht 66.0 in | Wt 138.0 lb

## 2019-04-15 DIAGNOSIS — I2510 Atherosclerotic heart disease of native coronary artery without angina pectoris: Secondary | ICD-10-CM

## 2019-04-15 DIAGNOSIS — I2584 Coronary atherosclerosis due to calcified coronary lesion: Secondary | ICD-10-CM

## 2019-04-15 DIAGNOSIS — J449 Chronic obstructive pulmonary disease, unspecified: Secondary | ICD-10-CM | POA: Diagnosis not present

## 2019-04-15 DIAGNOSIS — J9611 Chronic respiratory failure with hypoxia: Secondary | ICD-10-CM

## 2019-04-15 MED ORDER — ACETAMINOPHEN-CODEINE #3 300-30 MG PO TABS: 1.0000 | ORAL_TABLET | ORAL | 0 refills | Status: DC | PRN

## 2019-04-15 MED ORDER — TIOTROPIUM BROMIDE MONOHYDRATE 2.5 MCG/ACT IN AERS: 2.0000 | INHALATION_SPRAY | Freq: Every day | RESPIRATORY_TRACT | 0 refills | Status: DC

## 2019-04-15 MED ORDER — AMOXICILLIN-POT CLAVULANATE 875-125 MG PO TABS: 1.0000 | ORAL_TABLET | Freq: Two times a day (BID) | ORAL | 0 refills | Status: AC

## 2019-04-15 MED ORDER — BUDESONIDE-FORMOTEROL FUMARATE 160-4.5 MCG/ACT IN AERO: 2.0000 | INHALATION_SPRAY | Freq: Two times a day (BID) | RESPIRATORY_TRACT | 0 refills | Status: DC

## 2019-04-15 MED ORDER — FAMOTIDINE 20 MG PO TABS: ORAL_TABLET | ORAL | 11 refills | Status: DC

## 2019-04-15 MED ORDER — PREDNISONE 10 MG PO TABS: ORAL_TABLET | ORAL | 0 refills | Status: DC

## 2019-04-15 MED ORDER — PANTOPRAZOLE SODIUM 40 MG PO TBEC: 40.0000 mg | DELAYED_RELEASE_TABLET | Freq: Every day | ORAL | 2 refills | Status: DC

## 2019-04-15 MED ORDER — ACETAMINOPHEN-CODEINE #3 300-30 MG PO TABS: 1.0000 | ORAL_TABLET | ORAL | 0 refills | Status: AC | PRN

## 2019-04-15 MED ORDER — FLUTTER DEVI: 0 refills | Status: DC

## 2019-04-15 NOTE — ED Notes (Signed)
Pt feeling anxious about being on room air. Pt breathing shallow and fast. Coached pt on effective breathing while ambulating on pulse ox, room air. While ambulating pt SPO2 remained above 93%. Pt calm upon DC with no questions at this time

## 2019-04-15 NOTE — Patient Instructions (Addendum)
Plan A = Automatic = symbicort 160 Take 2 puffs first thing in am and then another 2 puffs about 12 hours later.  Spiriva x 2 puffs  right after symbicort  Pantoprazole (protonix) 40 mg   Take  30-60 min before first meal of the day and Pepcid (famotidine)  20 mg one hour before bedtime until return to office - this is the best way to tell whether stomach acid is contributing to your problem.     Prednisone is 10 mg   4 if worse, 2 until better, then 1 daily until seen   GERD (REFLUX)  is an extremely common cause of respiratory symptoms just like yours , many times with no obvious heartburn at all.    It can be treated with medication, but also with lifestyle changes including elevation of the head of your bed (ideally with 6 -8inch blocks under the headboard of your bed),  Smoking cessation, avoidance of late meals, excessive alcohol, and avoid fatty foods, chocolate, peppermint, colas, red wine, and acidic juices such as orange juice.  NO MINT OR MENTHOL PRODUCTS SO NO COUGH DROPS  USE SUGARLESS CANDY INSTEAD (Jolley ranchers or Stover's or Life Savers) or even ice chips will also do - the key is to swallow to prevent all throat clearing. NO OIL BASED VITAMINS - use powdered substitutes.  Avoid fish oil when coughing.       Plan B = Backup Only use your albuterol inhaler(Proventil/Proair)  as a rescue medication to be used if you can't catch your breath by resting or doing a relaxed purse lip breathing pattern.  - The less you use it, the better it will work when you need it. - Ok to use the inhaler up to 2 puffs  every 4 hours if you must but call for appointment if use goes up over your usual need - Don't leave home without it !!  (think of it like the spare tire for your car)    Plan C = Crisis - only use your albuterol nebulizer if you first try Plan B and it fails to help > ok to use the nebulizer up to every 4 hours but if start needing it regularly call for immediate  appointment   Plan D = Deltasone  See prednisone dosing above   Plan E = ER - go to ER or call 911 if all else fails    For cough > mucinex or mucinex dm up to 1200 mg every 12 hours and use flutter valve as much as you can and if can't quit coughing then use Tylenol #3 one every 4 hours as needed     02  2lpm at bedtime and adjust to keep it over 90% with activity    We will set up a televisit in 2 weeks - call if needed in meantime  - add needs alpha one screen on return

## 2019-04-15 NOTE — Progress Notes (Addendum)
Paul [name], male    DOB: [birth year],    MRN: [id number]   Brief patient profile:  8 yowm quit smoking  02/03/09  with GOLD III spirometry baseline doe x able to golf. Only using 02 at  [facility] with  maint on symbicort/ spiriva and no chronic pred/ albuterol twice daily at most  much worse mid March 2020 with new onset AFib and placed on 02 24/7    PFT 05/29/16: FVC 2.08 L (86%) FEV1 0.83 L (31%) FEV1/FVC 0.40 FEF 25-75 0.30 L (14%) no bronchodilator response TLC 6.56 L (107%) RV 167% ERV 160% DLCO corrected 60% (hemoglobin 10.9) 07/15/13: FVC 2.68 L (60%) FEV1 1.63 L (53%) FEV1/FVC 0.61 FEF 25-75 0.76 L (26%) 02/04/12: FVC 2.18 L (57%) FEV1 1.09 L (36%) FEV1/FVC 0.50 FEF 25-75 0.33 L (11%)   Admit date: 03/19/2019 Discharge date: 03/25/2019     Discharge Diagnoses:      Active Hospital Problems   Diagnosis Date Noted   PAF (paroxysmal atrial fibrillation) (HCC)    Acute on chronic respiratory failure with hypoxia (HCC)    COPD exacerbation ([place]) 03/19/2019         Filed Weights   03/23/19 1700 03/24/19 0503 03/25/19 0528  Weight: 60.2 kg 60 kg 62.3 kg       Chief Complaint:Worsening shortness of breath  BWG:YKZL Paul [name] a 73 y.o.malewith history h/oCOPD, hyperlipidemia whoat baseline was using 2 L O2 via nasal cannula only at night and follows [facility] (Dr. Vaughan Browner) as outpatient presents with worsening shortness of breath. Patient reports that at baseline he has cough with white phlegm and is able to walk around doing his ADLs without shortness of breath and uses O2 only at night. 2 weeks back he developed productive cough with green phlegm and shortness of breath but no fevers. He underwent CT chest with contrast on March 18 which was negative for PE or infiltrates. He apparently was given doxycycline/prednisone course at that time which helped him transiently but symptoms worsened again past Monday with shortness of breath on minimum exertion/cough  and he noticed his pulse ox was dropping to 74%.He started using 2 L O2 nasal cannula continuous. On Tuesday, he called pulmonary office given persistent symptoms and also pulse oximetry showing O2 sats84% on exertion and 93% at rest in spite of using continuous O2. He was advised by Dr. Vaughan Browner to use 60 mg prednisone x3 days followed by 10 mg taper every 3 days. He was also advised to use nebulizer treatments every 3 hours and continue Symbicort inhaler twice daily. Patient was supposed to start 50 mg prednisone today but woke up feeling extremely short of breath. He states he is also been having bad bouts of cough at night with wheezing and bronchospasm.  He presented to [place] ED where he was noted to be afebrile but hypoxic, required BiPAP for 3 hours, chest x-ray was negative for infiltrates. Patient denies any recent history of travel or known sick contacts with flu positive or coronavirus positive patients. He lives at home with his wife who is recovering from lung cancer and completed chemotherapy 1 month back.  Hospitalist service was called to request direct admission to medical floor but ED to ED transfer facilitated for thorough clinical evaluation prior to requesting medical bed given coronavirus pandemic. In the ED at [facility], patient has been saturating well on 3 L nasal cannula but appears tachypneic on talking full sentences. He denies any chest pain, denies any fevers  or leg swellings.  Hospital Course:  Active Problems:   COPD exacerbation (HCC)   PAF (paroxysmal atrial fibrillation) (HCC)   Acute on chronic respiratory failure with hypoxia (HCC)   Acute onHypoxic respiratory failure, COPDexacerbation,Progression of severe COPD -Emphysema, with no evidence of superimposed acute cardiopulmonary disease -improving on iv steroids, oral zitrhomax, he finished total of 5 days of zithromax in the hospital, he is discharged on slow prednisone taper.  - o2  dropped to 86% while ambulating on room air, discharged on continuous home o2 ( previously on nighttime o2 only) -he is to follow with Dr Vaughan Browner    Paroxsymalafib, New diagnosis of afib -he is started on eliquis (CHADS-VASC 2, age and aortic atherosclerosis), cardizem, lasix -cardiology consulted recommend "Diltiazem CD 360. Avoid amiodarone and beta blockers given his underlying lung dz." -outpatient echo per cardiology   Aortic and multivessel coronary atherosclerosis - Changed to high intensity statin, atorvastatin 40mg  PO QD. Check lipids and ALT as outpatient in 3 months.  - ASA 81. Aggressive secondary prevention. - No chest pain.      History of Present Illness  03/29/2019  Pulmonary/ extended post hosp f/u eval/Fedra Lanter  Re GOLD III copd ? 02 dep now Chief Complaint  Patient presents with   Hospitalization Follow-up    Breathing has improved some. He has not used his albuterol inhaler or neb since hospital d/c.  Dyspnea:  mb and back ok off 02 sats upper 80s at end  Cough: none Sleep:  Recliner since d/c at 30 degrees/ and baseline 02 2lpm hs  SABA use: none  Since d/c on symb 160 2bid and spiriva each pm (technique 50% on both, see a/p) On pred 50 mg daily on day as ov rec Plan A = Automatic = Symbicort 160 Take 2 puffs first thing in am and then another 2 puffs about 12 hours later.  Spiriva x 2 puffs  right after symbicort  Work on inhaler technique:   Plan B = Backup Only use your albuterol inhaler(Proventil/Proair)  as a rescue medication  Plan C = Crisis - only use your albuterol nebulizer if you first try Plan B and it fails to help > ok to use the nebulizer up to every 4 hours but if start needing it regularly call for immediate appointment 02  2lpm at bedtime and adjust to keep it over 90% with activity    04/12/19 Telemedicine / Kenney Houseman NP worse cough Rec  Prednisone 20 mg / day    ER 04/14/19  No new rx/ just nebs and one injection Dex      04/15/2019  Acute ov/ Sabreena Vogan  re aecopd  Chief Complaint  Patient presents with   Acute Visit    Pt c/o increased SOB over the past wk- worse x 3 days. He also c/o cough with green sputum and wheezing. He is using his proair 2 x per wk and neb with albuterol 2 x per day.   Dyspnea:  Room to room am of ov did ok s desat off 02  Cough: at hs worse  Min productive Sleeping: poor and needing back in recliner  SABA use: not understanding ABC plan  02: 2lpm hs and prn    No obvious day to day or daytime variability or assoc excess/ purulent sputum or mucus plugs or hemoptysis or cp or chest tightness, subjective wheeze or overt sinus or hb symptoms.    . Also denies any obvious fluctuation of symptoms with weather or environmental changes  or other aggravating or alleviating factors except as outlined above   No unusual exposure hx or h/o childhood pna/ asthma or knowledge of premature birth.  Current Allergies, Complete Past Medical History, Past Surgical History, Family History, and Social History were reviewed in [employer] record.  ROS  The following are not active complaints unless bolded Hoarseness, sore throat, dysphagia, dental problems, itching, sneezing,  nasal congestion or discharge of excess mucus or purulent secretions, ear ache,   fever, chills, sweats, unintended wt loss or wt gain, classically pleuritic or exertional cp,  orthopnea pnd or arm/hand swelling  or leg swelling, presyncope, palpitations, abdominal pain, anorexia, nausea, vomiting, diarrhea  or change in bowel habits or change in bladder habits, change in stools or change in urine, dysuria, hematuria,  rash, arthralgias, visual complaints, headache, numbness, weakness or ataxia or problems with walking or coordination,  change in mood or  memory.        Current Meds  Medication Sig   albuterol (PROAIR HFA) 108 (90 Base) MCG/ACT inhaler Inhale 2 puffs into the lungs every 6 (six) hours as  needed for wheezing or shortness of breath.   albuterol (PROVENTIL) (2.5 MG/3ML) 0.083% nebulizer solution Take 3 mLs (2.5 mg total) by nebulization every 6 (six) hours as needed for wheezing or shortness of breath.   apixaban (ELIQUIS) 5 MG TABS tablet Take 1 tablet (5 mg total) by mouth 2 (two) times daily.   atorvastatin (LIPITOR) 40 MG tablet Take 1 tablet (40 mg total) by mouth daily after supper.   budesonide-formoterol (SYMBICORT) 160-4.5 MCG/ACT inhaler Inhale 2 puffs into the lungs 2 (two) times daily.   diltiazem (CARDIZEM CD) 360 MG 24 hr capsule Take 1 capsule (360 mg total) by mouth daily.   fish oil-omega-3 fatty acids 1000 MG capsule Take 1 g by mouth daily after supper.    furosemide (LASIX) 20 MG tablet Take 1 tablet (20 mg total) by mouth daily.   Guaifenesin (MUCINEX MAXIMUM STRENGTH) 1200 MG TB12 Take 1 tablet by mouth 2 (two) times a day.   loratadine (CLARITIN) 10 MG tablet Take 10 mg by mouth daily.   LORazepam (ATIVAN) 0.5 MG tablet 1 tablet as needed.   metoprolol tartrate (LOPRESSOR) 25 MG tablet Take 1 tablet (25 mg total) by mouth 2 (two) times daily.   OXYGEN Inhale 2 L into the lungs continuous. 24/7   predniSONE (DELTASONE) 10 MG tablet Take 2 tablets (20 mg total) by mouth daily with breakfast for 30 days.   Respiratory Therapy Supplies (FLUTTER) DEVI Use 2-3 times daily   sertraline (ZOLOFT) 50 MG tablet Take 50 mg by mouth daily.   Tiotropium Bromide Monohydrate (SPIRIVA RESPIMAT) 2.5 MCG/ACT AERS Inhale 2 puffs into the lungs daily at 3 pm.                Objective:      w/c bound wm nad   Wt Readings from Last 3 Encounters:  04/15/19 138 lb (62.6 kg)  04/13/19 137 lb (62.1 kg)  04/06/19 140 lb (63.5 kg)     Vital signs reviewed - Note on arrival 02 sats  94% on RA          HEENT: full dentures/ nl oropharynx. Nl external ear canals without cough reflex -  Mild bilateral non-specific turbinate edema     NECK :  without  JVD/Nodes/TM/ nl carotid upstrokes bilaterally   LUNGS: no acc muscle use,  Mod barrel  contour chest wall with  bilateral  Distant bs s audible wheeze and  without cough on insp or exp maneuver and mod  Hyperresonant  to  percussion bilaterally     CV:  RRR  no s3 or murmur or increase in P2, and trace bilateral pedal edema L > R   ABD:  soft and nontender with pos mid insp Hoover's  in the supine position. No bruits or organomegaly appreciated, bowel sounds nl  MS:   Nl gait/  ext warm without deformities, calf tenderness, cyanosis or clubbing No obvious joint restrictions   SKIN: warm and dry without lesions    NEURO:  alert, approp, nl sensorium with  no motor or cerebellar deficits apparent.        I personally reviewed images and agree with radiology impression as follows:  CXR:   04/14/2019  No acute changes   Labs ordered/ reviewed:      Chemistry      Component Value Date/Time   NA 130 (L) 04/14/2019 2104   NA 135 04/08/2019 1041   K 4.4 04/14/2019 2104   CL 87 (L) 04/14/2019 2104   CO2 32 04/14/2019 2104   BUN 11 04/14/2019 2104   BUN 13 04/08/2019 1041   CREATININE 0.65 04/14/2019 2104      Component Value Date/Time   CALCIUM 8.9 04/14/2019 2104   ALKPHOS 54 03/19/2019 1301   AST 29 03/19/2019 1301   ALT 46 (H) 03/19/2019 1301   BILITOT 0.5 03/19/2019 1301        Lab Results  Component Value Date   WBC 10.7 (H) 04/14/2019   HGB 12.4 (L) 04/14/2019   HCT 39.3 04/14/2019   MCV 80.4 04/14/2019   PLT 352 04/14/2019         Lab Results  Component Value Date   TSH 2.539 03/22/2019      BNP   04/14/19   =  90.4     Lab Results  Component Value Date   ESRSEDRATE 10 03/19/2019            Assessment

## 2019-04-16 ENCOUNTER — Encounter: Payer: Self-pay | Admitting: Internal Medicine

## 2019-04-16 NOTE — Progress Notes (Signed)
Chart and office note reviewed in detail  > agree with a/p as outlined    

## 2019-04-16 NOTE — Assessment & Plan Note (Signed)
Quit smoking 01/2009  02/04/12 [name]: FEV1 36% FVC 57% FEF 25 75    11% -05/29/16  FEV1 0.83 L (31%) FEV1/FVC 0.40 FEF 25-75 0.30 L (14%) no bronchodilator response p ? rx prior, DLCO corrected 60% (hemoglobin 10.9) - 03/29/2019   continue symb 160 2bid and spiriva smi 2 puffs each am  - 04/15/2019  After extensive coaching inhaler device,  effectiveness =    90% with both hfa and smi but not understanding contingencies for hfa/ neb  - 04/15/2019 placed on daily pred with ceiling 40 and floor of 10 mg daily and empirical rx for GERD - 04/15/2019 flutter valve training   DDX of  difficult airways management almost all start with A and  include Adherence, Ace Inhibitors, Acid Reflux, Active Sinus Disease, Alpha 1 Antitripsin deficiency, Anxiety masquerading as Airways dz,  ABPA,  Allergy(esp in young), Aspiration (esp in elderly), Adverse effects of meds,  Active smoking or vaping, A bunch of PE's (a small clot burden can't cause this syndrome unless there is already severe underlying pulm or vascular dz with poor reserve) plus two Bs  = Bronchiectasis and Beta blocker use..and one C= CHF   Adherence is always the initial "prime suspect" and is a multilayered concern that requires a "trust but verify" approach in every patient - starting with knowing how to use medications, especially inhalers, correctly, keeping up with refills and understanding the fundamental difference between maintenance and prns vs those medications only taken for a very short course and then stopped and not refilled.  - see hfa / smi/ flutter valve training  ? Acid (or non-acid) GERD > always difficult to exclude as up to 75% of pts in some series report no assoc GI/ Heartburn symptoms> rec max (24h)  acid suppression and diet restrictions/ reviewed and instructions given in writing.  - Of the three most common causes of  Sub-acute / recurrent or chronic cough, only one (GERD)  can actually contribute to/ trigger  the other two (asthma  and post nasal drip syndrome)  and perpetuate the cylce of cough.  While not intuitively obvious, many patients with chronic low grade reflux do not cough until there is a primary insult that disturbs the protective epithelial barrier and exposes sensitive nerve endings.   This is typically viral but can due to PNDS and  either may apply here.   The point is that once this occurs, it is difficult to eliminate the cycle  using anything but a maximally effective acid suppression regimen at least in the short run, accompanied by an appropriate diet to address non acid GERD and control / eliminate the cough itself for at least 3 days with tylenol #3    ? Allergy/asthma component appears very steroid responsive - The goal with a chronic steroid dependent illness is always arriving at the lowest effective dose that controls the disease/symptoms and not accepting a set "formula" which is based on statistics or guidelines that don't always take into account patient  variability or the natural hx of the dz in every individual patient, which may well vary over time.  For now therefore I recommend the patient maintain  Ceiling of 40 mg and floor of 10 mg daily for now  ? Anxiety > usually at the bottom of this list of usual suspects but should be much higher on this pt's based on H and P and note already on psychotropics and may interfere with adherence and also interpretation of response or lack  thereof to symptom management which can be quite subjective.   ? A bunch of PE's >  Not likely while on eliquis for afib  ? Alpha one def > no phenotype on record, needs to be done on return   ? chf > despite afib he has nl bnp with flare which rules against significant copd   F/u 2 weeks with televisit to see if floor dose of prednisone can be reduced    Each maintenance medication was reviewed with pt and daughter  in detail including most importantly the difference between maintenance and as needed and under  what circumstances the prns are to be used.  Please see AVS for specific  Instructions which are unique to this visit and I personally typed out  which were reviewed in detail in writing with the patient and a copy provided.

## 2019-04-16 NOTE — Assessment & Plan Note (Signed)
02/06/12 ONO RA: desat <88% 7 episodes per hour, 66 episodes total 01/27/2013 ONO RA desat < 88% 28min of recording , still needs nocturnal oxygen - 03/25/2019 d/c from admit on 2lpm but not using x at hs  - 04/15/2019 02 sats 94% at rest despite flare in copd so rec 2lpm  Hs and prn activity  to keep sats > 90% at all times   Adequate control on present rx, reviewed in detail with pt > no change in rx needed     I had an extended discussion with the patient reviewing all relevant studies completed to date and  lasting 25 minutes of a 40  minute acute  visit post ER eval     re  severe non-specific but potentially very serious refractory respiratory symptoms of uncertain and potentially multiple  Etiologies.  See device teaching which extended face to face time for this visit (3 devices = smi, hfa/ flutter valve training)    Each maintenance medication was reviewed in detail including most importantly the difference between maintenance and prns and under what circumstances the prns are to be triggered using an action plan format that is not reflected in the computer generated alphabetically organized AVS.    Please see AVS for specific instructions unique to this office visit that I personally wrote and verbalized to the the pt in detail and then reviewed with pt  by my nurse highlighting any changes in therapy/plan of care  recommended at today's visit.

## 2019-04-19 ENCOUNTER — Ambulatory Visit: Payer: Medicare Other | Admitting: Internal Medicine

## 2019-04-19 ENCOUNTER — Telehealth: Payer: Self-pay | Admitting: Cardiology

## 2019-04-19 NOTE — Telephone Encounter (Signed)
Spoke with wife (on [place]) RE: leg and ankle edema.  Reviewed pt's diet which wife reports he has not been eating any extra NA+.  They cook at home and do not use any processed foods.  Last BUN/Crea 04/14/2019 11/0.65/K+ 4.4.  Advised to wear knee high compression stockings, keep feet and leg elevated above the level of his heart as much as possible and OK to take extra 20 mg of Furosemide 6 hours after his AM dose X 3 days.  Advised to c/b if no improvement.  Wife states understanding and was grateful for the call and information.  Will forward to Dr Marlou Porch for his knowledge.

## 2019-04-19 NOTE — Telephone Encounter (Signed)
Pt c/o swelling: STAT is pt has developed SOB within 24 hours  1) How much weight have you gained and in what time span? 2 lbs  2) If swelling, where is the swelling located? Yes, feet and ankles  3) Are you currently taking a fluid pill? Yes, lasix  4) Are you currently SOB? no  5) Do you have a log of your daily weights (if so, list)?   6) Have you gained 3 pounds in a day or 5 pounds in a week? no  7) Have you traveled recently? No  Spouse would like to know if lasix can be increased.

## 2019-04-26 ENCOUNTER — Ambulatory Visit (INDEPENDENT_AMBULATORY_CARE_PROVIDER_SITE_OTHER): Payer: Medicare Other | Admitting: Internal Medicine

## 2019-04-26 ENCOUNTER — Other Ambulatory Visit: Payer: Self-pay

## 2019-04-26 ENCOUNTER — Encounter: Payer: Self-pay | Admitting: Internal Medicine

## 2019-04-26 ENCOUNTER — Ambulatory Visit: Payer: Medicare Other | Admitting: Pulmonary Disease

## 2019-04-26 DIAGNOSIS — J449 Chronic obstructive pulmonary disease, unspecified: Secondary | ICD-10-CM

## 2019-04-26 DIAGNOSIS — J9611 Chronic respiratory failure with hypoxia: Secondary | ICD-10-CM

## 2019-04-26 NOTE — Assessment & Plan Note (Signed)
Quit smoking 01/2009  02/04/12 [name]: FEV1 36% FVC 57% FEF 25 75    11% -05/29/16  FEV1 0.83 L (31%) FEV1/FVC 0.40 FEF 25-75 0.30 L (14%) no bronchodilator response p ? rx prior, DLCO corrected 60% (hemoglobin 10.9) - 03/29/2019   continue symb 160 2bid and spiriva smi 2 puffs each am  - 04/15/2019  After extensive coaching inhaler device,  effectiveness =    90% with both hfa and smi but not understanding contingencies for hfa/ neb  - 04/15/2019 placed on daily pred with ceiling 40 and floor of 10 mg daily and empirical rx for GERD - 04/15/2019 flutter valve training   He is clearly better and ? Really systemic steroid dep but in past has flared with taper   The goal with a chronic steroid dependent illness is always arriving at the lowest effective dose that controls the disease/symptoms and not accepting a set "formula" which is based on statistics or guidelines that don't always take into account patient  variability or the natural hx of the dz in every individual patient, which may well vary over time.  For now therefore I recommend the patient maintain  Ceiling of 40 mg daily and taper to 5 mg daily if doing well and f/u in 4 weeks to see if can taper off

## 2019-04-26 NOTE — Assessment & Plan Note (Signed)
02/06/12 ONO RA: desat <88% 7 episodes per hour, 66 episodes total 01/27/2013 ONO RA desat < 88% 44min of recording , still needs nocturnal oxygen - 03/25/2019 d/c from admit on 2lpm but not using x at hs  - 04/15/2019 02 sats 94% at rest despite flare in copd so rec 2lpm  Hs and prn activity  to keep sats > 90% at all times   As of 04/26/2019   2lpm hs and prn daytime    Each maintenance medication was reviewed in detail including most importantly the difference between maintenance and as needed and under what circumstances the prns are to be used.  Please see AVS for specific  Instructions which are unique to this visit and I personally typed out  which were reviewed in detail over the phone with the patient and wife and a copy provided via My Chart.

## 2019-04-26 NOTE — Progress Notes (Signed)
Paul [name], male    DOB: [birth year]    MRN: [id number]   Brief patient profile:  72 yowm quit smoking  02/03/09  with GOLD III spirometry baseline doe x able to golf. Only using 02 at  [facility] with  maint on symbicort/ spiriva and no chronic pred/ albuterol twice daily at most  much worse mid March 2020 with new onset AFib and placed on 02 24/7    PFT 05/29/16: FVC 2.08 L (86%) FEV1 0.83 L (31%) FEV1/FVC 0.40 FEF 25-75 0.30 L (14%) no bronchodilator response TLC 6.56 L (107%) RV 167% ERV 160% DLCO corrected 60% (hemoglobin 10.9) 07/15/13: FVC 2.68 L (60%) FEV1 1.63 L (53%) FEV1/FVC 0.61 FEF 25-75 0.76 L (26%) 02/04/12: FVC 2.18 L (57%) FEV1 1.09 L (36%) FEV1/FVC 0.50 FEF 25-75 0.33 L (11%)   Admit date: 03/19/2019 Discharge date: 03/25/2019     Discharge Diagnoses:      Active Hospital Problems   Diagnosis Date Noted   PAF (paroxysmal atrial fibrillation) (HCC)    Acute on chronic respiratory failure with hypoxia (HCC)    COPD exacerbation ([place]) 03/19/2019         Filed Weights   03/23/19 1700 03/24/19 0503 03/25/19 0528  Weight: 60.2 kg 60 kg 62.3 kg       Chief Complaint:Worsening shortness of breath  EXH:BZJI Paul [name] a 72 y.o.malewith history h/oCOPD, hyperlipidemia whoat baseline was using 2 L O2 via nasal cannula only at night and follows [facility] (Dr. Vaughan Browner) as outpatient presents with worsening shortness of breath. Patient reports that at baseline he has cough with white phlegm and is able to walk around doing his ADLs without shortness of breath and uses O2 only at night. 2 weeks back he developed productive cough with green phlegm and shortness of breath but no fevers. He underwent CT chest with contrast on March 18 which was negative for PE or infiltrates. He apparently was given doxycycline/prednisone course at that time which helped him transiently but symptoms worsened again past Monday with shortness of breath on minimum exertion/cough  and he noticed his pulse ox was dropping to 74%.He started using 2 L O2 nasal cannula continuous. On Tuesday, he called pulmonary office given persistent symptoms and also pulse oximetry showing O2 sats84% on exertion and 93% at rest in spite of using continuous O2. He was advised by Dr. Vaughan Browner to use 60 mg prednisone x3 days followed by 10 mg taper every 3 days. He was also advised to use nebulizer treatments every 3 hours and continue Symbicort inhaler twice daily. Patient was supposed to start 50 mg prednisone today but woke up feeling extremely short of breath. He states he is also been having bad bouts of cough at night with wheezing and bronchospasm.  He presented to [facility] ED where he was noted to be afebrile but hypoxic, required BiPAP for 3 hours, chest x-ray was negative for infiltrates. Patient denies any recent history of travel or known sick contacts with flu positive or coronavirus positive patients. He lives at home with his wife who is recovering from lung cancer and completed chemotherapy 1 month back.  Hospitalist service was called to request direct admission to medical floor but ED to ED transfer facilitated for thorough clinical evaluation prior to requesting medical bed given coronavirus pandemic. In the ED at [facility], patient has been saturating well on 3 L nasal cannula but appears tachypneic on talking full sentences. He denies any chest pain, denies any fevers  or leg swellings.  Hospital Course:  Active Problems:   COPD exacerbation (HCC)   PAF (paroxysmal atrial fibrillation) (HCC)   Acute on chronic respiratory failure with hypoxia (HCC)   Acute onHypoxic respiratory failure, COPDexacerbation,Progression of severe COPD -Emphysema, with no evidence of superimposed acute cardiopulmonary disease -improving on iv steroids, oral zitrhomax, he finished total of 5 days of zithromax in the hospital, he is discharged on slow prednisone taper.  - o2  dropped to 86% while ambulating on room air, discharged on continuous home o2 ( previously on nighttime o2 only) -he is to follow with Dr Vaughan Browner    Paroxsymalafib, New diagnosis of afib -he is started on eliquis (CHADS-VASC 2, age and aortic atherosclerosis), cardizem, lasix -cardiology consulted recommend "Diltiazem CD 360. Avoid amiodarone and beta blockers given his underlying lung dz." -outpatient echo per cardiology   Aortic and multivessel coronary atherosclerosis - Changed to high intensity statin, atorvastatin 40mg  PO QD. Check lipids and ALT as outpatient in 3 months.  - ASA 81. Aggressive secondary prevention. - No chest pain.      History of Present Illness  03/29/2019  Pulmonary/ extended post hosp f/u eval/Paul [name]  Re GOLD III copd ? 02 dep now Chief Complaint  Patient presents with   Hospitalization Follow-up    Breathing has improved some. He has not used his albuterol inhaler or neb since hospital d/c.  Dyspnea:  mb and back ok off 02 sats upper 80s at end  Cough: none Sleep:  Recliner since d/c at 30 degrees/ and baseline 02 2lpm hs  SABA use: none  Since d/c on symb 160 2bid and spiriva each pm (technique 50% on both, see a/p) On pred 50 mg daily on day as ov rec Plan A = Automatic = Symbicort 160 Take 2 puffs first thing in am and then another 2 puffs about 12 hours later.  Spiriva x 2 puffs  right after symbicort  Work on inhaler technique:   Plan B = Backup Only use your albuterol inhaler(Proventil/Proair)  as a rescue medication  Plan C = Crisis - only use your albuterol nebulizer if you first try Plan B and it fails to help > ok to use the nebulizer up to every 4 hours but if start needing it regularly call for immediate appointment 02  2lpm at bedtime and adjust to keep it over 90% with activity    04/12/19 Telemedicine / Kenney Houseman NP worse cough Rec  Prednisone 20 mg / day    ER 04/14/19  No new rx/ just nebs and one injection Dex      04/15/2019  Acute ov/ Paul [name]  re aecopd  Chief Complaint  Patient presents with   Acute Visit    Pt c/o increased SOB over the past wk- worse x 3 days. He also c/o cough with green sputum and wheezing. He is using his proair 2 x per wk and neb with albuterol 2 x per day.   Dyspnea:  Room to room am of ov did ok s desat off 02  Cough: at hs worse  Min productive Sleeping: poor and needing back in recliner  SABA use: not understanding ABC plan  02: 2lpm hs and prn  rec Plan A = Automatic = symbicort 160 Take 2 puffs first thing in am and then another 2 puffs about 12 hours later.  Spiriva x 2 puffs  right after symbicort Pantoprazole (protonix) 40 mg   Take  30-60 min before first meal of the day  and Pepcid (famotidine)  20 mg one hour before bedtime until return to office - this is the best way to tell whether stomach acid is contributing to your problem.   Prednisone is 10 mg   4 if worse, 2 until better, then 1 daily until seen GERD diet   Plan B = Backup Only use your albuterol inhaler(Proventil/Proair)  as a rescue medication Plan C = Crisis - only use your albuterol nebulizer if you first try Plan B and it fails to help Plan D = Deltasone  See prednisone dosing above  For cough > mucinex or mucinex dm up to 1200 mg every 12 hours and use flutter valve as much as you can and if can't quit coughing then use Tylenol #3 one every 4 hours as needed   02  2lpm at bedtime and adjust to keep it over 90% with activity  We will set up a televisit in 2 weeks - call if needed in meantime  - add needs alpha one screen on return    Virtual Visit via Telephone Note 04/26/2019 copd/ pred at 30 mg daily   I connected with Paul [name] on 04/26/19 at 10:15 AM EDT by telephone and verified that I am speaking with the correct person using two identifiers.   I discussed the limitations, risks, security and privacy concerns of performing an evaluation and management service by telephone and the  availability of in person appointments. I also discussed with the patient that there may be a patient responsible charge related to this service. The patient expressed understanding and agreed to proceed.    History of Present Illness: Dyspnea:  MB is 30 yards to MB and then has to sit and rest Cough: some after supper  Sleeping: on side / bed is flat and 2 pillows  SABA use:  Rarely needing  02: 2lpm / it drops as low 88%     No obvious day to day or daytime variability or assoc excess/ purulent sputum or mucus plugs or hemoptysis or cp or chest tightness, subjective wheeze or overt sinus or hb symptoms.    Also denies any obvious fluctuation of symptoms with weather or environmental changes or other aggravating or alleviating factors except as outlined above.   Meds reviewed/ med reconciliation completed       Observations/Objective: Good phonation/ positive outlook, speaking in full sentences    Assessment and Plan: See problem list for active a/p's   Follow Up Instructions: See avs for instructions unique to this ov which includes revised/ updated med list     I discussed the assessment and treatment plan with the patient. The patient was provided an opportunity to ask questions and all were answered. The patient agreed with the plan and demonstrated an understanding of the instructions.   The patient was advised to call back or seek an in-person evaluation if the symptoms worsen or if the condition fails to improve as anticipated.  I provided 25 minutes of non-face-to-face time during this encounter.   Christinia Gully, MD

## 2019-04-26 NOTE — Patient Instructions (Addendum)
Pt  Has my chart and just needs an appt for 4 weeks with all meds in hand    No change in medications except taper prednisone as low as possible but not below 5 mg daily (10 mg x one half)  -worse go back to the previous dose that was working unless severe flare in which case start over at 40 mg daily

## 2019-04-29 ENCOUNTER — Telehealth: Payer: Self-pay | Admitting: Cardiology

## 2019-04-29 DIAGNOSIS — I480 Paroxysmal atrial fibrillation: Secondary | ICD-10-CM | POA: Diagnosis not present

## 2019-04-29 NOTE — Telephone Encounter (Signed)
Received outpatient page regarding patient HR being more elevated today that normal. Pt wife states that most of the day today, his HR has been fluctuating up into the 100-115 range and then will come down on its own while sitting. She states that he has not had chest pain, SOB or palpitations. He was recently dx with atrial fibrillation and was seen in the AF clinic 04/13/2019 with plans to continue his CCB, BB and AC therapy. Once DCCV can be performed, plan is to schedule him for an OP procedure per Roderic Palau, NP note. His BP was taken by his wife which was found to be 123/67. I have asked her to give him an additional dose of metoprolol 25mg  now and then resume his normal regimen this evening. I have asked her to take his BP prior to his evening dose and if SBP less than 90, she is not to give the medication. If his symptoms persist tomorrow, I have asked her to call the office and it may be that his BB dose will need to be increased. ED precautions reviewed and pt and family understand and agree.    Kathyrn Drown NP-C [place] Pager: [phone]

## 2019-05-03 ENCOUNTER — Telehealth: Payer: Self-pay | Admitting: Internal Medicine

## 2019-05-03 ENCOUNTER — Encounter (HOSPITAL_COMMUNITY): Payer: Self-pay | Admitting: Emergency Medicine

## 2019-05-03 ENCOUNTER — Emergency Department (HOSPITAL_COMMUNITY): Payer: Medicare Other

## 2019-05-03 ENCOUNTER — Inpatient Hospital Stay (HOSPITAL_COMMUNITY)
Admission: EM | Admit: 2019-05-03 | Discharge: 2019-05-06 | DRG: 190 | Disposition: A | Discharge status: Home / Self Care | Payer: Medicare Other | Attending: Internal Medicine | Admitting: Internal Medicine

## 2019-05-03 ENCOUNTER — Other Ambulatory Visit: Payer: Self-pay | Admitting: Cardiology

## 2019-05-03 ENCOUNTER — Other Ambulatory Visit: Payer: Self-pay

## 2019-05-03 DIAGNOSIS — I4891 Unspecified atrial fibrillation: Secondary | ICD-10-CM | POA: Diagnosis present

## 2019-05-03 DIAGNOSIS — J9621 Acute and chronic respiratory failure with hypoxia: Secondary | ICD-10-CM | POA: Diagnosis present

## 2019-05-03 DIAGNOSIS — G4700 Insomnia, unspecified: Secondary | ICD-10-CM | POA: Diagnosis present

## 2019-05-03 DIAGNOSIS — J302 Other seasonal allergic rhinitis: Secondary | ICD-10-CM | POA: Diagnosis not present

## 2019-05-03 DIAGNOSIS — D72829 Elevated white blood cell count, unspecified: Secondary | ICD-10-CM | POA: Diagnosis present

## 2019-05-03 DIAGNOSIS — Z7901 Long term (current) use of anticoagulants: Secondary | ICD-10-CM

## 2019-05-03 DIAGNOSIS — J449 Chronic obstructive pulmonary disease, unspecified: Secondary | ICD-10-CM | POA: Diagnosis not present

## 2019-05-03 DIAGNOSIS — R600 Localized edema: Secondary | ICD-10-CM | POA: Diagnosis not present

## 2019-05-03 DIAGNOSIS — Z9981 Dependence on supplemental oxygen: Secondary | ICD-10-CM

## 2019-05-03 DIAGNOSIS — Z20828 Contact with and (suspected) exposure to other viral communicable diseases: Secondary | ICD-10-CM | POA: Diagnosis present

## 2019-05-03 DIAGNOSIS — Z8249 Family history of ischemic heart disease and other diseases of the circulatory system: Secondary | ICD-10-CM | POA: Diagnosis not present

## 2019-05-03 DIAGNOSIS — J441 Chronic obstructive pulmonary disease with (acute) exacerbation: Secondary | ICD-10-CM | POA: Diagnosis not present

## 2019-05-03 DIAGNOSIS — K219 Gastro-esophageal reflux disease without esophagitis: Secondary | ICD-10-CM | POA: Diagnosis not present

## 2019-05-03 DIAGNOSIS — M7989 Other specified soft tissue disorders: Secondary | ICD-10-CM | POA: Diagnosis not present

## 2019-05-03 DIAGNOSIS — F329 Major depressive disorder, single episode, unspecified: Secondary | ICD-10-CM | POA: Diagnosis not present

## 2019-05-03 DIAGNOSIS — Z87891 Personal history of nicotine dependence: Secondary | ICD-10-CM | POA: Diagnosis not present

## 2019-05-03 DIAGNOSIS — F419 Anxiety disorder, unspecified: Secondary | ICD-10-CM | POA: Diagnosis present

## 2019-05-03 DIAGNOSIS — Z79899 Other long term (current) drug therapy: Secondary | ICD-10-CM | POA: Diagnosis not present

## 2019-05-03 DIAGNOSIS — E785 Hyperlipidemia, unspecified: Secondary | ICD-10-CM | POA: Diagnosis not present

## 2019-05-03 DIAGNOSIS — R0602 Shortness of breath: Secondary | ICD-10-CM | POA: Diagnosis not present

## 2019-05-03 DIAGNOSIS — Z7952 Long term (current) use of systemic steroids: Secondary | ICD-10-CM | POA: Diagnosis not present

## 2019-05-03 LAB — CBC
HCT: 41 % (ref 39.0–52.0)
Hemoglobin: 12.3 g/dL — ABNORMAL LOW (ref 13.0–17.0)
MCH: 24.9 pg — ABNORMAL LOW (ref 26.0–34.0)
MCHC: 30 g/dL (ref 30.0–36.0)
MCV: 83.2 fL (ref 80.0–100.0)
Platelets: 394 10*3/uL (ref 150–400)
RBC: 4.93 MIL/uL (ref 4.22–5.81)
RDW: 14.6 % (ref 11.5–15.5)
WBC: 14.2 10*3/uL — ABNORMAL HIGH (ref 4.0–10.5)
nRBC: 0 % (ref 0.0–0.2)

## 2019-05-03 LAB — COMPREHENSIVE METABOLIC PANEL
ALT: 41 U/L (ref 0–44)
AST: 20 U/L (ref 15–41)
Albumin: 3.5 g/dL (ref 3.5–5.0)
Alkaline Phosphatase: 66 U/L (ref 38–126)
Anion gap: 10 (ref 5–15)
BUN: 8 mg/dL (ref 8–23)
CO2: 32 mmol/L (ref 22–32)
Calcium: 8.8 mg/dL — ABNORMAL LOW (ref 8.9–10.3)
Chloride: 95 mmol/L — ABNORMAL LOW (ref 98–111)
Creatinine, Ser: 0.68 mg/dL (ref 0.61–1.24)
GFR calc Af Amer: >60 mL/min (ref >60)
GFR calc non Af Amer: >60 mL/min (ref >60)
Glucose, Bld: 125 mg/dL — ABNORMAL HIGH (ref 70–99)
Potassium: 4.3 mmol/L (ref 3.5–5.1)
Sodium: 137 mmol/L (ref 135–145)
Total Bilirubin: 0.5 mg/dL (ref 0.3–1.2)
Total Protein: 6.5 g/dL (ref 6.5–8.1)

## 2019-05-03 LAB — SARS CORONAVIRUS 2 BY RT PCR (HOSPITAL ORDER, PERFORMED IN ~~LOC~~ HOSPITAL LAB): SARS Coronavirus 2: NEGATIVE

## 2019-05-03 LAB — BRAIN NATRIURETIC PEPTIDE: B Natriuretic Peptide: 58.4 pg/mL (ref 0.0–100.0)

## 2019-05-03 LAB — GLUCOSE, CAPILLARY: Glucose-Capillary: 144 mg/dL — ABNORMAL HIGH (ref 70–99)

## 2019-05-03 MED ORDER — AEROCHAMBER PLUS FLO-VU LARGE MISC
Status: AC
  Filled 2019-05-03: qty 1

## 2019-05-03 MED ORDER — SERTRALINE HCL 50 MG PO TABS
50.0000 mg | ORAL_TABLET | Freq: Every day | ORAL | Status: DC
  Administered 2019-05-04 – 2019-05-06 (×3): 50 mg via ORAL
  Filled 2019-05-03 (×3): qty 1

## 2019-05-03 MED ORDER — ACETAMINOPHEN 650 MG RE SUPP: 650.0000 mg | Freq: Four times a day (QID) | RECTAL | Status: DC | PRN

## 2019-05-03 MED ORDER — LORATADINE 10 MG PO TABS
10.0000 mg | ORAL_TABLET | Freq: Every day | ORAL | Status: DC
  Administered 2019-05-04 – 2019-05-06 (×3): 10 mg via ORAL
  Filled 2019-05-03 (×3): qty 1

## 2019-05-03 MED ORDER — DILTIAZEM HCL ER COATED BEADS 180 MG PO CP24
360.0000 mg | ORAL_CAPSULE | Freq: Every day | ORAL | Status: DC
  Administered 2019-05-04 – 2019-05-06 (×3): 360 mg via ORAL
  Filled 2019-05-03 (×3): qty 2

## 2019-05-03 MED ORDER — MOMETASONE FURO-FORMOTEROL FUM 200-5 MCG/ACT IN AERO
2.0000 | INHALATION_SPRAY | Freq: Two times a day (BID) | RESPIRATORY_TRACT | Status: DC
  Administered 2019-05-03 – 2019-05-06 (×6): 2 via RESPIRATORY_TRACT
  Filled 2019-05-03: qty 8.8

## 2019-05-03 MED ORDER — IPRATROPIUM-ALBUTEROL 0.5-2.5 (3) MG/3ML IN SOLN
3.0000 mL | Freq: Four times a day (QID) | RESPIRATORY_TRACT | Status: DC
  Administered 2019-05-03 – 2019-05-04 (×4): 3 mL via RESPIRATORY_TRACT
  Filled 2019-05-03 (×4): qty 3

## 2019-05-03 MED ORDER — ACETAMINOPHEN 325 MG PO TABS: 650.0000 mg | ORAL_TABLET | Freq: Four times a day (QID) | ORAL | Status: DC | PRN

## 2019-05-03 MED ORDER — FAMOTIDINE 20 MG PO TABS
20.0000 mg | ORAL_TABLET | Freq: Every day | ORAL | Status: DC
  Administered 2019-05-03 – 2019-05-05 (×3): 20 mg via ORAL
  Filled 2019-05-03 (×3): qty 1

## 2019-05-03 MED ORDER — AZITHROMYCIN 250 MG PO TABS
250.0000 mg | ORAL_TABLET | Freq: Every day | ORAL | Status: DC
  Administered 2019-05-04 – 2019-05-06 (×3): 250 mg via ORAL
  Filled 2019-05-03 (×3): qty 1

## 2019-05-03 MED ORDER — IPRATROPIUM BROMIDE HFA 17 MCG/ACT IN AERS
10.0000 | INHALATION_SPRAY | Freq: Once | RESPIRATORY_TRACT | Status: AC
  Administered 2019-05-03: 10 via RESPIRATORY_TRACT
  Filled 2019-05-03 (×2): qty 12.9

## 2019-05-03 MED ORDER — AEROCHAMBER PLUS FLO-VU LARGE MISC
1.0000 | Freq: Once | Status: AC
  Administered 2019-05-03: 1

## 2019-05-03 MED ORDER — PANTOPRAZOLE SODIUM 40 MG PO TBEC
40.0000 mg | DELAYED_RELEASE_TABLET | Freq: Every day | ORAL | Status: DC
  Administered 2019-05-04 – 2019-05-06 (×3): 40 mg via ORAL
  Filled 2019-05-03 (×3): qty 1

## 2019-05-03 MED ORDER — DEXAMETHASONE SODIUM PHOSPHATE 10 MG/ML IJ SOLN
10.0000 mg | Freq: Once | INTRAMUSCULAR | Status: AC
  Administered 2019-05-03: 10 mg via INTRAVENOUS
  Filled 2019-05-03: qty 1

## 2019-05-03 MED ORDER — AZITHROMYCIN 250 MG PO TABS
500.0000 mg | ORAL_TABLET | Freq: Every day | ORAL | Status: AC
  Administered 2019-05-03: 500 mg via ORAL
  Filled 2019-05-03: qty 2

## 2019-05-03 MED ORDER — APIXABAN 5 MG PO TABS
5.0000 mg | ORAL_TABLET | Freq: Two times a day (BID) | ORAL | Status: DC
  Administered 2019-05-03 – 2019-05-06 (×6): 5 mg via ORAL
  Filled 2019-05-03 (×6): qty 1

## 2019-05-03 MED ORDER — METOPROLOL TARTRATE 25 MG PO TABS
25.0000 mg | ORAL_TABLET | Freq: Two times a day (BID) | ORAL | Status: DC
  Administered 2019-05-03 – 2019-05-06 (×6): 25 mg via ORAL
  Filled 2019-05-03 (×6): qty 1

## 2019-05-03 MED ORDER — DEXAMETHASONE SODIUM PHOSPHATE 10 MG/ML IJ SOLN: 10.0000 mg | Freq: Once | INTRAMUSCULAR | Status: AC

## 2019-05-03 MED ORDER — ALBUTEROL SULFATE HFA 108 (90 BASE) MCG/ACT IN AERS
2.0000 | INHALATION_SPRAY | Freq: Once | RESPIRATORY_TRACT | Status: AC
  Administered 2019-05-03: 2 via RESPIRATORY_TRACT
  Filled 2019-05-03: qty 6.7

## 2019-05-03 MED ORDER — ATORVASTATIN CALCIUM 40 MG PO TABS
40.0000 mg | ORAL_TABLET | Freq: Every day | ORAL | Status: DC
  Administered 2019-05-03 – 2019-05-05 (×3): 40 mg via ORAL
  Filled 2019-05-03 (×3): qty 1

## 2019-05-03 NOTE — ED Notes (Signed)
Daughter [place] Go: [phone]

## 2019-05-03 NOTE — ED Triage Notes (Signed)
Pt arrives to ED from home with complaints of shortness of breath for the past week that has worsened today. Pt has hx of COPD and stated that his oxygen levels have dropped to as low as 78% at home on 2L [place].

## 2019-05-03 NOTE — Telephone Encounter (Signed)
New message:   Patient calling about some medication name Diltiazem. There are no more refills and would like to know should be still taken this medication. Please call patient.

## 2019-05-03 NOTE — Telephone Encounter (Signed)
Yes. Continue diltiazem.  Thanks Candee Furbish, MD

## 2019-05-03 NOTE — Telephone Encounter (Signed)
Nothing else to offer over the phone Instructions were to increase pred back to 40 mg / day if got worse so if hasn't done so should now do and see [place] by weekend with all meds in hand  If already did this for a couple of days and no better > to ER to start over

## 2019-05-03 NOTE — ED Provider Notes (Signed)
[facility] EMERGENCY DEPARTMENT Provider Note   CSN: [id number] Arrival date & time: 05/03/19  1301    History   Chief Complaint Chief Complaint  Patient presents with  . Shortness of Breath    HPI Paul [name] is a 73 y.o. male who  has a past medical history of A-fib ([place]), COPD (chronic obstructive pulmonary disease) ([place]), and High cholesterol.  He is anticoagulated for his A. fib on Eliquis.  Patient arrives for dyspnea. He is chronically oxygen dependent on 2 L.the patient states that over the past few weeks he has had worsening exertional and nonexertional shortness of breath.  Patient states that any little movement he makes causes his oxygen to to drop. He went to the bathroom yesterday and his 02 dropped ito 80%.  He states that he is really having difficulty breathing and feels extremely short of breath.  He has been using his nebulizer, Legionella, and Pulmicort inhalers at home without improvement.  He denies chest pain, unilateral leg swelling, peripheral edema.  He contacted Dr. Shyrl Numbers of pulmonary critical care and was sent here for further evaluation.      CHA2DS2/VAS Stroke Risk Points  Current as of 15 minutes ago     2 >= 2 Points: High Risk  1 - 1.99 Points: Medium Risk  0 Points: Low Risk    The previous score was 1 on 03/24/2019.:  Last Change:     Details    This score determines the patient's risk of having a stroke if the  patient has atrial fibrillation.       Points Metrics  0 Has Congestive Heart Failure:  No    Current as of 15 minutes ago  1 Has Vascular Disease:  Yes    Current as of 15 minutes ago  0 Has Hypertension:  No    Current as of 15 minutes ago  1 Age:  44    Current as of 15 minutes ago  0 Has Diabetes:  No    Current as of 15 minutes ago  0 Had Stroke:  No  Had TIA:  No  Had thromboembolism:  No    Current as of 15 minutes ago  0 Male:  No    Current as of 15 minutes ago            HPI  Past Medical  History:  Diagnosis Date  . A-fib ([place])   . COPD (chronic obstructive pulmonary disease) ([place])   . High cholesterol     Patient Active Problem List   Diagnosis Date Noted  . Chronic anticoagulation 04/06/2019  . Coronary artery calcification 04/06/2019  . Atherosclerosis of aorta ([place]) 04/06/2019  . Paroxysmal atrial fibrillation ([facility])   . Acute on chronic respiratory failure with hypoxia ([place])   . COPD exacerbation ([place]) 03/19/2019  . Lung nodule < 6cm on CT 05/29/2016  . Former cigarette smoker 12/05/2015  . Chronic respiratory failure with hypoxia ([place]) 01/27/2013  . COPD GOLD  III/ 02 hs only    . High cholesterol     Past Surgical History:  Procedure Laterality Date  . CATARACT EXTRACTION Right   . SKIN SURGERY     shoulders and chest        Home Medications    Prior to Admission medications   Medication Sig Start Date End Date Taking? Authorizing Provider  albuterol (PROAIR HFA) 108 (90 Base) MCG/ACT inhaler Inhale 2 puffs into the lungs every 6 (six) hours as  needed for wheezing or shortness of breath.   Yes [provider]  albuterol (PROVENTIL) (2.5 MG/3ML) 0.083% nebulizer solution Take 3 mLs (2.5 mg total) by nebulization every 6 (six) hours as needed for wheezing or shortness of breath. 02/28/19  Yes [name], PA-C  apixaban (ELIQUIS) 5 MG TABS tablet Take 1 tablet (5 mg total) by mouth 2 (two) times daily. 04/06/19  Yes Jerline Pain, MD  atorvastatin (LIPITOR) 40 MG tablet Take 1 tablet (40 mg total) by mouth daily after supper. 04/06/19  Yes Jerline Pain, MD  budesonide-formoterol (SYMBICORT) 160-4.5 MCG/ACT inhaler Inhale 2 puffs into the lungs 2 (two) times daily. 04/15/19  Yes Tanda Rockers, MD  diltiazem (CARDIZEM CD) 360 MG 24 hr capsule Take 1 capsule (360 mg total) by mouth daily. 04/06/19  Yes Jerline Pain, MD  doxylamine, Sleep, (UNISOM) 25 MG tablet Take 25 mg by mouth at bedtime as needed for sleep.   Yes [provider]   famotidine (PEPCID) 20 MG tablet One hour before  bedtime Patient taking differently: Take 20 mg by mouth daily after supper.  04/15/19  Yes Tanda Rockers, MD  furosemide (LASIX) 20 MG tablet Take 1 tablet (20 mg total) by mouth daily. 04/06/19  Yes Jerline Pain, MD  Investigational - Study Medication Apply 1 patch topically See admin instructions. Patient wears a patch ("Long term A-FIB MONITOR") that has an embedded sensor/chip- is mailed in as directed by provider   Yes [provider]  loratadine (CLARITIN) 10 MG tablet Take 10 mg by mouth daily.   Yes [provider]  LORazepam (ATIVAN) 0.5 MG tablet Take 0.5 mg by mouth daily as needed for anxiety.  04/07/19  Yes [provider]  metoprolol tartrate (LOPRESSOR) 25 MG tablet Take 1 tablet (25 mg total) by mouth 2 (two) times daily. 04/12/19  Yes Jerline Pain, MD  OXYGEN Inhale 2 L into the lungs continuous.    Yes [provider]  pantoprazole (PROTONIX) 40 MG tablet Take 1 tablet (40 mg total) by mouth daily. Take 30-60 min before first meal of the day 04/15/19  Yes Tanda Rockers, MD  Phenylephrine-APAP-guaiFENesin (MUCINEX FAST-MAX) [phone] MG/20ML LIQD Take 15 mLs by mouth as needed (to loosen phlegm).    Yes [provider]  predniSONE (DELTASONE) 10 MG tablet 4 if worse, then 2 daily until all better, then stay on 1 with bfast Patient taking differently: Take 40 mg by mouth See admin instructions. Take 40 mg by mouth daily with reakfast as directed (decrease to 20 mg once a day, if tolerated, then 10 mg once a day, thereafter) 04/15/19  Yes Tanda Rockers, MD  Respiratory Therapy Supplies (FLUTTER) DEVI Use as directed Patient taking differently: as directed.  04/15/19  Yes Tanda Rockers, MD  sertraline (ZOLOFT) 50 MG tablet Take 50 mg by mouth daily.   Yes [provider]  Tiotropium Bromide Monohydrate (SPIRIVA RESPIMAT) 2.5 MCG/ACT AERS Inhale 2 puffs into the lungs daily at 3  pm. Patient taking differently: Inhale 2 puffs into the lungs daily.  04/15/19  Yes Tanda Rockers, MD    Family History Family History  Problem Relation Age of Onset  . Heart disease Brother   . Heart disease Mother   . Heart disease Father   . Cancer Paternal Uncle     Social History Social History   Tobacco Use  . Smoking status: Former Smoker    Packs/day:  2.00    Years: 52.00    Pack years: 104.00    Types: Cigarettes    Last attempt to quit: 02/03/2009    Years since quitting: 10.2  . Smokeless tobacco: Never Used  . Tobacco comment: Counseled to remain smoke free  Substance Use Topics  . Alcohol use: Yes    Alcohol/week: 0.0 standard drinks    Comment: occ  . Drug use: No     Allergies   Patient has no known allergies.   Review of Systems Review of Systems   Ten systems reviewed and are negative for acute change, except as noted in the HPI.    Physical Exam Updated Vital Signs BP (!) 125/58   Pulse (P) 95   Temp (P) 98.8 F (37.1 C) (Oral)   Resp (!) 23   SpO2 (P) 98%   Physical Exam Vitals signs and nursing note reviewed.  Constitutional:      General: He is not in acute distress.    Appearance: He is well-developed. He is not diaphoretic.  HENT:     Head: Normocephalic and atraumatic.  Eyes:     General: No scleral icterus.    Conjunctiva/sclera: Conjunctivae normal.  Neck:     Musculoskeletal: Normal range of motion and neck supple.  Cardiovascular:     Rate and Rhythm: Normal rate and regular rhythm.     Heart sounds: Normal heart sounds.  Pulmonary:     Effort: Tachypnea and accessory muscle usage present. No respiratory distress.     Breath sounds: Decreased breath sounds present. No wheezing or rhonchi.     Comments: Prolonged expiratory phase,  Poor air movement and inspiration.  Abdominal:     Palpations: Abdomen is soft.     Tenderness: There is no abdominal tenderness.  Musculoskeletal:     Right lower leg: No edema.      Left lower leg: No edema.  Skin:    General: Skin is warm and dry.  Neurological:     Mental Status: He is alert.  Psychiatric:        Behavior: Behavior normal.      ED Treatments / Results  Labs (all labs ordered are listed, but only abnormal results are displayed) Labs Reviewed  COMPREHENSIVE METABOLIC PANEL - Abnormal; Notable for the following components:      Result Value   Chloride 95 (*)    Glucose, Bld 125 (*)    Calcium 8.8 (*)    All other components within normal limits  CBC - Abnormal; Notable for the following components:   WBC 14.2 (*)    Hemoglobin 12.3 (*)    MCH 24.9 (*)    All other components within normal limits  SARS CORONAVIRUS 2 (HOSPITAL ORDER, [facility])  BRAIN NATRIURETIC PEPTIDE    EKG EKG Interpretation  Date/Time:  Monday May 03 2019 13:13:52 EDT Ventricular Rate:  83 PR Interval:    QRS Duration: 82 QT Interval:  343 QTC Calculation: 403 R Axis:   76 Text Interpretation:  Sinus rhythm Biatrial enlargement Probable anteroseptal infarct, old similar to prior 4/20 Confirmed by Aletta Edouard [phone]) on 05/03/2019 1:17:01 PM   Radiology Dg Chest Port 1 View  Result Date: 05/03/2019 CLINICAL DATA:  COPD, shortness of breath EXAM: PORTABLE CHEST 1 VIEW COMPARISON:  04/14/2019 FINDINGS: There is hyperinflation of the lungs compatible with COPD. Heart and mediastinal contours are within normal limits. No focal opacities or effusions. No acute bony abnormality. IMPRESSION:  COPD.  No active disease. Electronically Signed   By: Rolm Baptise M.D.   On: 05/03/2019 13:48    Procedures Procedures (including critical care time)  Medications Ordered in ED Medications  azithromycin (ZITHROMAX) tablet 500 mg (has no administration in time range)    Followed by  azithromycin (ZITHROMAX) tablet 250 mg (has no administration in time range)  dexamethasone (DECADRON) injection 10 mg (has no administration in time range)   AeroChamber Plus Flo-Vu Large MISC 1 each (1 each Other Given 05/03/19 1457)  albuterol (VENTOLIN HFA) 108 (90 Base) MCG/ACT inhaler 2 puff (2 puffs Inhalation Given 05/03/19 1457)  ipratropium (ATROVENT HFA) inhaler 10 puff (10 puffs Inhalation Given 05/03/19 1502)  dexamethasone (DECADRON) injection 10 mg (10 mg Intravenous Given 05/03/19 1457)     Initial Impression / Assessment and Plan / ED Course  I have reviewed the triage vital signs and the nursing notes.  Pertinent labs & imaging results that were available during my care of the patient were reviewed by me and considered in my medical decision making (see chart for details).  Clinical Course as of May 02 1609  Mon May 03, 2019  1443 WBC(!): 14.2 [AH]  [id number] 73 year old male with significant COPD here with worsening shortness of breath.  He said he is had some productive greenish-yellow sputum although not much.  No known fevers.  He is dyspneic at rest and said he has not been able to sleep secondary to shortness of breath.  He will likely need to be admitted for further management of COPD exacerbation.   [MB]    Clinical Course User Index [AH] Margarita Mail, PA-C [MB] Hayden Rasmussen, MD      CC:sob VS: BP (!) 125/58   Pulse (P) 95   Temp (P) 98.8 F (37.1 C) (Oral)   Resp (!) 23   SpO2 (P) 98%   AS:TMHDQQI is gathered by patient  and  emr. DDX:The emergent differential diagnosis for shortness of breath includes, but is not limited to, Pulmonary edema, bronchoconstriction, Pneumonia, Pulmonary embolism, Pneumotherax/ Hemothorax, Dysrythmia, ACS.  Labs: I reviewed the labs which show negative coronavirus, BNP in normal limits, mild hypochloremia, elevated blood glucose, low calcium.  Patient's white blood cell count 14.2.  Imaging: I personally reviewed the images (chest x-ray) which show(s) hyperinflation, no focal opacities or effusions EKG:  EKG Interpretation  Date/Time:  Monday May 03 2019 13:13:52 EDT  Ventricular Rate:  83 PR Interval:    QRS Duration: 82 QT Interval:  343 QTC Calculation: 403 R Axis:   76 Text Interpretation:  Sinus rhythm Biatrial enlargement Probable anteroseptal infarct, old similar to prior 4/20 Confirmed by Aletta Edouard [phone]) on 05/03/2019 1:17:01 PM       MDM: COPD exacerbation.  Patient treated here with 10 puffs of albuterol, 2 of Atrovent, IV Decadron with minimal improvement.  Suspect COPD exacerbation.  Afebrile.  I spoken with the internal medicine resident service who will admit the patient for further management.  He appears appropriate and stable for admission at this time Patient disposition: Admit Patient condition: Stable. The patient appears reasonably screened and/or stabilized for discharge and I doubt any other medical condition or other [facility] requiring further screening, evaluation, or treatment in the ED at this time prior to discharge. I have discussed lab and/or imaging findings with the patient and answered all questions/concerns to the best of my ability. I have discussed return precautions and OP follow up.    .   Final  Clinical Impressions(s) / ED Diagnoses   Final diagnoses:  COPD exacerbation [facility])    ED Discharge Orders    None       Margarita Mail, PA-C 05/03/19 1610    Hayden Rasmussen, MD 05/03/19 1900

## 2019-05-03 NOTE — ED Notes (Signed)
ED TO INPATIENT HANDOFF REPORT  ED Nurse Name and Phone #: [phone]  S Name/Age/Gender Paul [name] 73 y.o. male Room/Bed: 017C/017C  Code Status   Code Status: Prior  Home/SNF/Other Home Patient oriented to: self, place, time and situation Is this baseline? Yes   Triage Complete: Triage complete  Chief Complaint Low Oxygen; COPD  Triage Note Pt arrives to ED from home with complaints of shortness of breath for the past week that has worsened today. Pt has hx of COPD and stated that his oxygen levels have dropped to as low as 78% at home on 2L [place].    Allergies No Known Allergies  Level of Care/Admitting Diagnosis ED Disposition    ED Disposition Condition Comment   Admit  Hospital Area: [facility] [[id number]]  Level of Care: Telemetry Medical [104]  Covid Evaluation: N/A  Diagnosis: COPD exacerbation [facility]) [[id number]]  Admitting Physician: Oda Kilts [[id number]]  Attending Physician: Oda Kilts [[id number]]  Estimated length of stay: past midnight tomorrow  Certification:: I certify this patient will need inpatient services for at least 2 midnights  PT Class (Do Not Modify): Inpatient [101]  PT Acc Code (Do Not Modify): Private [1]       B Medical/Surgery History Past Medical History:  Diagnosis Date  . A-fib ([place])   . COPD (chronic obstructive pulmonary disease) ([place])   . High cholesterol    Past Surgical History:  Procedure Laterality Date  . CATARACT EXTRACTION Right   . SKIN SURGERY     shoulders and chest     A IV Location/Drains/Wounds Patient Lines/Drains/Airways Status   Active Line/Drains/Airways    Name:   Placement date:   Placement time:   Site:   Days:   Peripheral IV 05/03/19 Right Forearm   05/03/19    1323    Forearm   less than 1          Intake/Output Last 24 hours No intake or output data in the 24 hours ending 05/03/19 1514  Labs/Imaging Results for orders placed or performed during the hospital  encounter of 05/03/19 (from the past 48 hour(s))  Comprehensive metabolic panel     Status: Abnormal   Collection Time: 05/03/19  1:23 PM  Result Value Ref Range   Sodium 137 135 - 145 mmol/L   Potassium 4.3 3.5 - 5.1 mmol/L   Chloride 95 (L) 98 - 111 mmol/L   CO2 32 22 - 32 mmol/L   Glucose, Bld 125 (H) 70 - 99 mg/dL   BUN 8 8 - 23 mg/dL   Creatinine, Ser 0.68 0.61 - 1.24 mg/dL   Calcium 8.8 (L) 8.9 - 10.3 mg/dL   Total Protein 6.5 6.5 - 8.1 g/dL   Albumin 3.5 3.5 - 5.0 g/dL   AST 20 15 - 41 U/L   ALT 41 0 - 44 U/L   Alkaline Phosphatase 66 38 - 126 U/L   Total Bilirubin 0.5 0.3 - 1.2 mg/dL   GFR calc non Af Amer >60 >60 mL/min   GFR calc Af Amer >60 >60 mL/min   Anion gap 10 5 - 15    Comment: Performed at [facility], [address] [place] [address]  CBC     Status: Abnormal   Collection Time: 05/03/19  1:23 PM  Result Value Ref Range   WBC 14.2 (H) 4.0 - 10.5 K/uL   RBC 4.93 4.22 - 5.81 MIL/uL   Hemoglobin 12.3 (L) 13.0 - 17.0  g/dL   HCT 41.0 39.0 - 52.0 %   MCV 83.2 80.0 - 100.0 fL   MCH 24.9 (L) 26.0 - 34.0 pg   MCHC 30.0 30.0 - 36.0 g/dL   RDW 14.6 11.5 - 15.5 %   Platelets 394 150 - 400 K/uL   nRBC 0.0 0.0 - 0.2 %    Comment: Performed at [facility] [address], [place], [place] [address]  Brain natriuretic peptide     Status: None   Collection Time: 05/03/19  1:24 PM  Result Value Ref Range   B Natriuretic Peptide 58.4 0.0 - 100.0 pg/mL    Comment: Performed at [facility] [address] [place], [place] [address]   Dg Chest Port 1 View  Result Date: 05/03/2019 CLINICAL DATA:  COPD, shortness of breath EXAM: PORTABLE CHEST 1 VIEW COMPARISON:  04/14/2019 FINDINGS: There is hyperinflation of the lungs compatible with COPD. Heart and mediastinal contours are within normal limits. No focal opacities or effusions. No acute bony abnormality. IMPRESSION: COPD.  No active disease. Electronically Signed   By: Rolm Baptise M.D.   On:  05/03/2019 13:48    Pending Labs Unresulted Labs (From admission, onward)    Start     Ordered   05/03/19 1323  SARS Coronavirus 2 (CEPHEID- Performed in [facility] lab), Hosp Order  (Symptomatic Patients Labs with Precautions )  Once,   R     05/03/19 1322          Vitals/Pain Today's Vitals   05/03/19 1330 05/03/19 1400 05/03/19 1430 05/03/19 1500  BP: 117/70 113/66 135/60 (!) 129/50  Pulse: 84 85 79 82  Resp: 19 14 (!) 24 (!) 31  Temp:      TempSrc:      SpO2: 99% 100% 98% 100%  PainSc:        Isolation Precautions Droplet and Contact precautions  Medications Medications  AeroChamber Plus Flo-Vu Large MISC 1 each (1 each Other Given 05/03/19 1457)  albuterol (VENTOLIN HFA) 108 (90 Base) MCG/ACT inhaler 2 puff (2 puffs Inhalation Given 05/03/19 1457)  ipratropium (ATROVENT HFA) inhaler 10 puff (10 puffs Inhalation Given 05/03/19 1502)  dexamethasone (DECADRON) injection 10 mg (10 mg Intravenous Given 05/03/19 1457)    Mobility walks Moderate fall risk   Focused Assessments Pulmonary Assessment Handoff:  Lung sounds: Bilateral Breath Sounds: Expiratory wheezes L Breath Sounds: Expiratory wheezes R Breath Sounds: Expiratory wheezes O2 Device: Nasal Cannula O2 Flow Rate (L/min): 2 L/min      R Recommendations: See Admitting Provider Note  Report given to:   Additional Notes:  COPD exacerbation

## 2019-05-03 NOTE — Telephone Encounter (Signed)
Pt asked me to speak to his wife who is not requesting a refill at this time but instead simply confirming the patient should continue the medication. Advised Mrs. Voth that the diltiazem should be continued. She verbalized understanding and will call when refill is needed.

## 2019-05-03 NOTE — H&P (Signed)
Date: 05/03/2019               [name] Name:  Paul [name] MRN: [id number]  DOB: [birth year] Age / Sex: 73 y.o., male   PCP: [name], MD         Medical Service: Internal Medicine Teaching Service         Attending Physician: Dr. Rebeca Alert Raynaldo Opitz, MD    First Contact: Dr. Donne Hazel Pager: [phone]  Second Contact: Dr. Frederico Hamman Pager: [phone]       After Hours (After 5p/  First Contact Pager: [phone]  weekends / holidays): Second Contact Pager: [phone]   Chief Complaint: dyspnea  History of Present Illness: Paul [name] is a 73 yo man with a medical history of COPD on 2L supplemental oxygen at home qhs, afib on eliquis, and HLD who presented with 2-3 weeks of progressively worsening dyspnea. Paul [name] was discharged from Paul [name] on 4/2 for a COPD exacerbation during which he was treated with IV steroids and azithromycin. No known etiology for that exacerbation, and Paul pt was covid negative. He was also given a new diagnosis of afib during that hospitalization and was started on eliquis, cardizem, and lasix. He was discharged on a prednisone taper. However, he has been unable to decrease his prednisone below 40mg  without his dyspnea worsening, so he has remained on 40mg . He reports that his dyspnea is present at rest and worsens with when he walks short distances. He checks his O2 sat at home and saw he was dropping to 79% with movement and to Paul 80s with talking. He puts on his oxygen when his sats drop. He endorses chronic cough and thick sputum production without recent changes. He denies fevers, URI symptoms, or sick contacts. He has been using his Symbicort and Spiriva daily and has required his Proair inhaler BID. He also has an albuterol nebulizer at home, which he has used. He reports Paul [name] improve his dyspnea. He denies headaches, chest pain, abdominal pain, leg swelling. He is a former heavy smoker with 104 pack years, but he quit ten years ago. He spoke  with his pulmonologist over Paul [name] today to describe his worsening dyspnea with instructions to go to Paul [name] if he continued to have hypoxia.   Upon arrival to Paul [name], pt was afebrile, hemodynamically stable, and satting well on 2L [place]. Labs showed mild leukocytosis (likely 2/2 chronic steroid use), BNP wnl, Covid negative. EKG was NSR with bi-atrial enlargement and no changes from prior. CXR with signs of COPD, but no edema or opacities. He received duoneb inhalers and 10mg  decadron in Paul [name].   Meds:  Current Meds  Medication Sig   albuterol (PROAIR HFA) 108 (90 Base) MCG/ACT inhaler Inhale 2 puffs into Paul [name] every 6 (six) hours as needed for wheezing or shortness of breath.   albuterol (PROVENTIL) (2.5 MG/3ML) 0.083% nebulizer solution Take 3 mLs (2.5 mg total) by nebulization every 6 (six) hours as needed for wheezing or shortness of breath.   apixaban (ELIQUIS) 5 MG TABS tablet Take 1 tablet (5 mg total) by mouth 2 (two) times daily.   atorvastatin (LIPITOR) 40 MG tablet Take 1 tablet (40 mg total) by mouth daily after supper.   budesonide-formoterol (SYMBICORT) 160-4.5 MCG/ACT inhaler Inhale 2 puffs into Paul [name] 2 (two) times daily.   diltiazem (CARDIZEM CD) 360 MG 24 hr capsule Take 1 capsule (360 mg total) by mouth daily.   doxylamine, Sleep, (UNISOM) 25  MG tablet Take 25 mg by mouth at bedtime as needed for sleep.   famotidine (PEPCID) 20 MG tablet One hour before  bedtime ([name] taking differently: Take 20 mg by mouth daily after supper. )   furosemide (LASIX) 20 MG tablet Take 1 tablet (20 mg total) by mouth daily.   Investigational - Study Medication Apply 1 patch topically See admin instructions. [name] wears a patch ("Long term A-FIB MONITOR") that has an embedded sensor/chip- is mailed in as directed by provider   loratadine (CLARITIN) 10 MG tablet Take 10 mg by mouth daily.   LORazepam (ATIVAN) 0.5 MG tablet Take 0.5 mg by mouth daily as needed for anxiety.      metoprolol tartrate (LOPRESSOR) 25 MG tablet Take 1 tablet (25 mg total) by mouth 2 (two) times daily.   OXYGEN Inhale 2 L into Paul [name].    pantoprazole (PROTONIX) 40 MG tablet Take 1 tablet (40 mg total) by mouth daily. Take 30-60 min before first meal of Paul [name]   Phenylephrine-APAP-guaiFENesin (MUCINEX FAST-MAX) 10-650-400 MG/20ML LIQD Take 15 mLs by mouth as needed (to loosen phlegm).    predniSONE (DELTASONE) 10 MG tablet 4 if worse, then 2 daily until all better, then stay on 1 with bfast ([name] taking differently: Take 40 mg by mouth See admin instructions. Take 40 mg by mouth daily with reakfast as directed (decrease to 20 mg once a [name], if tolerated, then 10 mg once a [name], thereafter))   Respiratory Therapy Supplies (FLUTTER) DEVI Use as directed ([name] taking differently: as directed. )   sertraline (ZOLOFT) 50 MG tablet Take 50 mg by mouth daily.   Tiotropium Bromide Monohydrate (SPIRIVA RESPIMAT) 2.5 MCG/ACT AERS Inhale 2 puffs into Paul [name] daily at 3 pm. ([name] taking differently: Inhale 2 puffs into Paul [name] daily. )   Allergies: Allergies as of 05/03/2019   (No Known Allergies)   Past Medical History:  Diagnosis Date   A-fib (HCC)    COPD (chronic obstructive pulmonary disease) (HCC)    High cholesterol    Past Surgical History:  Procedure Laterality Date   CATARACT EXTRACTION Right    SKIN SURGERY     shoulders and chest   Family History:  Family History  Problem Relation Age of Onset   Heart disease Brother    Heart disease Mother    Heart disease Father    Cancer Paternal Uncle    Social History: Lives with his wife. Former smoker. Denies etoh or illicit drug use.   Review of Systems: A complete ROS was negative except as per HPI.   Physical Exam: Blood pressure 140/63, pulse 90, temperature 98.8 F (37.1 C), temperature source Oral, resp. rate (!) 22, height 5\' 6"  (1.676 m), weight 59.2 kg, SpO2 98  %.  Constitutional: Alert and oriented x3.  Eyes: Pupils are equal, round, and reactive to light. EOM are normal.  Cardiovascular: Normal rate and regular rhythm. No murmurs, rubs, or gallops. Pulmonary/Chest: Labored respiratory effort. De-sats to Paul 80s while talking on 2L. Poor air movement with decreased breath sounds throughout. No wheezing.  Abdominal: Bowel sounds present. Soft, non-distended, non-tender. Ext: No lower extremity edema. Skin: Warm and dry. No rashes or wounds.  EKG: personally reviewed my interpretation is NSR with bi-atrial enlargement and no changes from prior  CXR: personally reviewed my interpretation is no edema or opacities  Assessment & Plan by Problem: Active Problems:   COPD exacerbation [facility])  Mr. Haff is a 73 yo man  with a medical history of COPD on 2L supplemental oxygen at home qhs, afib on eliquis, and HLD who presented with 2-3 weeks of progressively worsening dyspnea despite supplemental oxygen and chronic prednisone. Clinical presentation consistent with COPD exacerbation versus gradual progression of COPD.   COPD exacerbation: PFTs in 2017 showed FEV1 31%, FEV1/FVC 0.40, and DLCO 60%. Follows with pulmonology. Unclear etiology of exacerbation. [name] has been adherent with home inhalers and there is low suspicion of viral URI. He does have a mild leukocytosis in Paul setting of steroid use. This may possibly represent worsening of his chronic lung disease as his decline has been progressive since his April hospitalization. He has been symptomatically responding to bronchodilators, however he continues to have poor air movement on exam without wheezes. He is currently on home oxygen of 2L, but is requiring it more frequently.  - Azithromycin ([name] 1/5) - Gave an additional 10mg  IV decadron for a total of 20mg . Holding home prednisone 40mg  daily. - Duonebs q6hrs scheduled - Dulera - Continue supplemental oxygen prn for O2 sat 88-92%  Afib:  Currently rate-controlled with beta blocker and calcium channel blocker. - Continue home diltiazem 360mg  daily and metoprolol 25mg  BID - Continue home eliquis  Lower extremity swelling: [name] was started on lasix during last admission. Was given empirically to see if his dyspnea would improve, but it didn't. His chronic lower extremity swelling improved and he was continued on lasix 20mg  daily. No echo in system. Pt denies hx of heart failure. Appears euvolemic without swelling today. Kidney function and K normal.  - Holding home lasix. Consider resuming if [name] develops lower extremity edema.   Anxiety: Holding home ativan 0.5mg  daily prn HLD: Continue home lipitor Depression: Continue home sertraline  GERD: Continue home pepcid and protonix Seasonal allergies: Continue home claritin  FEN: no IVF fluids, heart healthy diet, replace electrolytes as needed  DVT ppx: home eliquis Code status: FULL code  Dispo: Admit [name] to Inpatient with expected length of stay greater than 2 midnights.  Signed: Corinne Ports, MD 05/03/2019, 6:01 PM  Pager: [phone]

## 2019-05-03 NOTE — Telephone Encounter (Signed)
Primary Pulmonologist: MW Last office visit and with whom: 04/26/19 w/MW What do we see them for (pulmonary problems): COPD and chronic respiratory failure Last OV assessment/plan: 02/06/12 ONO RA: desat <88% 7 episodes per hour, 66 episodes total 01/27/2013 ONO RA desat < 88% 60min of recording , still needs nocturnal oxygen - 03/25/2019 d/c from admit on 2lpm but not using x at hs  - 04/15/2019 02 sats 94% at rest despite flare in copd so rec 2lpm  Hs and prn activity  to keep sats > 90% at all times   As of 04/26/2019   2lpm hs and prn daytime    Each maintenance medication was reviewed in detail including most importantly the difference between maintenance and as needed and under what circumstances the prns are to be used.  Please see AVS for specific  Instructions which are unique to this visit and I personally typed out  which were reviewed in detail over the phone with the patient and wife and a copy provided via My Chart.   Quit smoking 01/2009  02/04/12 [name]: FEV1 36% FVC 57% FEF 25 75    11% -05/29/16  FEV1 0.83 L (31%) FEV1/FVC 0.40 FEF 25-75 0.30 L (14%) no bronchodilator response p ? rx prior, DLCO corrected 60% (hemoglobin 10.9) - 03/29/2019   continue symb 160 2bid and spiriva smi 2 puffs each am  - 04/15/2019  After extensive coaching inhaler device,  effectiveness =    90% with both hfa and smi but not understanding contingencies for hfa/ neb  - 04/15/2019 placed on daily pred with ceiling 40 and floor of 10 mg daily and empirical rx for GERD - 04/15/2019 flutter valve training   He is clearly better and ? Really systemic steroid dep but in past has flared with taper   The goal with a chronic steroid dependent illness is always arriving at the lowest effective dose that controls the disease/symptoms and not accepting a set "formula" which is based on statistics or guidelines that don't always take into account patient  variability or the natural hx of the dz in every individual  patient, which may well vary over time.  For now therefore I recommend the patient maintain  Ceiling of 40 mg daily and taper to 5 mg daily if doing well and f/u in 4 weeks to see if can taper off   Was appointment offered to patient (explain)?  No, due to [phone]   Reason for call: Spoke with patient's wife. She stated that the patient has actually gotten worse since his appt with MW on 04/26/19. He is only sleeping 2-2.5 hours a night. His O2 keep dropping during the day. He was sitting and eating breakfast this morning and his O2 dropped to 83%. Yesterday, patient had used the restroom and his O2 dropped to 72%. Patient was able to move around his home without using O2 often. Now he is having to use 2L all day. Denies any fever.   Patient has not decreased his prednisone due to the increased SOB.   She wants to know what to do about his O2 levels.  Pharmacy is [facility] in [place].   MW, please advise. Thanks!

## 2019-05-03 NOTE — ED Notes (Signed)
Nurse Navigator communication with updates for to the patients daughter Hinton Dyer, she was able to speak with her son on the phone, he will be admitted to the hospital.

## 2019-05-03 NOTE — Telephone Encounter (Signed)
Called and spoke with pt's wife Paul [name] asking if pt did increase prednisone back to 40mg  as instructed and she stated that pt has been on 40mg  ever since last OV. I stated to Paul [name] that MW said since pt had become worse even after increasing prednisone that he needs to be evaluated at ER. Paul [name] expressed understanding.nothing further needed.

## 2019-05-04 ENCOUNTER — Encounter (HOSPITAL_COMMUNITY): Payer: Self-pay

## 2019-05-04 DIAGNOSIS — Z7952 Long term (current) use of systemic steroids: Secondary | ICD-10-CM

## 2019-05-04 DIAGNOSIS — J449 Chronic obstructive pulmonary disease, unspecified: Secondary | ICD-10-CM

## 2019-05-04 DIAGNOSIS — Z9981 Dependence on supplemental oxygen: Secondary | ICD-10-CM

## 2019-05-04 DIAGNOSIS — J9621 Acute and chronic respiratory failure with hypoxia: Secondary | ICD-10-CM

## 2019-05-04 DIAGNOSIS — G4700 Insomnia, unspecified: Secondary | ICD-10-CM

## 2019-05-04 DIAGNOSIS — I4891 Unspecified atrial fibrillation: Secondary | ICD-10-CM

## 2019-05-04 DIAGNOSIS — R600 Localized edema: Secondary | ICD-10-CM

## 2019-05-04 DIAGNOSIS — Z79899 Other long term (current) drug therapy: Secondary | ICD-10-CM

## 2019-05-04 DIAGNOSIS — Z7901 Long term (current) use of anticoagulants: Secondary | ICD-10-CM

## 2019-05-04 LAB — BASIC METABOLIC PANEL
Anion gap: 14 (ref 5–15)
BUN: 11 mg/dL (ref 8–23)
CO2: 31 mmol/L (ref 22–32)
Calcium: 8.9 mg/dL (ref 8.9–10.3)
Chloride: 92 mmol/L — ABNORMAL LOW (ref 98–111)
Creatinine, Ser: 0.62 mg/dL (ref 0.61–1.24)
GFR calc Af Amer: >60 mL/min (ref >60)
GFR calc non Af Amer: >60 mL/min (ref >60)
Glucose, Bld: 154 mg/dL — ABNORMAL HIGH (ref 70–99)
Potassium: 4.2 mmol/L (ref 3.5–5.1)
Sodium: 137 mmol/L (ref 135–145)

## 2019-05-04 LAB — CBC
HCT: 34.3 % — ABNORMAL LOW (ref 39.0–52.0)
Hemoglobin: 10.9 g/dL — ABNORMAL LOW (ref 13.0–17.0)
MCH: 25.3 pg — ABNORMAL LOW (ref 26.0–34.0)
MCHC: 31.8 g/dL (ref 30.0–36.0)
MCV: 79.8 fL — ABNORMAL LOW (ref 80.0–100.0)
Platelets: 325 10*3/uL (ref 150–400)
RBC: 4.3 MIL/uL (ref 4.22–5.81)
RDW: 14.6 % (ref 11.5–15.5)
WBC: 8.7 10*3/uL (ref 4.0–10.5)
nRBC: 0 % (ref 0.0–0.2)

## 2019-05-04 LAB — IRON AND TIBC
Iron: 58 ug/dL (ref 45–182)
Saturation Ratios: 22 % (ref 17.9–39.5)
TIBC: 260 ug/dL (ref 250–450)
UIBC: 202 ug/dL

## 2019-05-04 LAB — FERRITIN: Ferritin: 417 ng/mL — ABNORMAL HIGH (ref 24–336)

## 2019-05-04 LAB — GLUCOSE, CAPILLARY
Glucose-Capillary: 146 mg/dL — ABNORMAL HIGH (ref 70–99)
Glucose-Capillary: 125 mg/dL — ABNORMAL HIGH (ref 70–99)
Glucose-Capillary: 150 mg/dL — ABNORMAL HIGH (ref 70–99)
Glucose-Capillary: 191 mg/dL — ABNORMAL HIGH (ref 70–99)

## 2019-05-04 LAB — VITAMIN B12: Vitamin B-12: 460 pg/mL (ref 180–914)

## 2019-05-04 MED ORDER — INSULIN ASPART 100 UNIT/ML ~~LOC~~ SOLN
0.0000 [IU] | Freq: Three times a day (TID) | SUBCUTANEOUS | Status: DC
  Administered 2019-05-04 (×3): 1 [IU] via SUBCUTANEOUS

## 2019-05-04 MED ORDER — PREDNISONE 20 MG PO TABS
40.0000 mg | ORAL_TABLET | Freq: Every day | ORAL | Status: DC
  Administered 2019-05-04 – 2019-05-06 (×3): 40 mg via ORAL
  Filled 2019-05-04 (×3): qty 2

## 2019-05-04 MED ORDER — RAMELTEON 8 MG PO TABS
8.0000 mg | ORAL_TABLET | Freq: Once | ORAL | Status: AC
  Administered 2019-05-04: 8 mg via ORAL
  Filled 2019-05-04: qty 1

## 2019-05-04 MED ORDER — ALBUTEROL SULFATE (2.5 MG/3ML) 0.083% IN NEBU: 2.5000 mg | INHALATION_SOLUTION | RESPIRATORY_TRACT | Status: DC | PRN

## 2019-05-04 MED ORDER — RAMELTEON 8 MG PO TABS
8.0000 mg | ORAL_TABLET | Freq: Every day | ORAL | Status: DC
  Administered 2019-05-04 – 2019-05-05 (×2): 8 mg via ORAL
  Filled 2019-05-04 (×4): qty 1

## 2019-05-04 MED ORDER — INSULIN ASPART 100 UNIT/ML ~~LOC~~ SOLN: 0.0000 [IU] | Freq: Every day | SUBCUTANEOUS | Status: DC

## 2019-05-04 MED ORDER — IPRATROPIUM-ALBUTEROL 0.5-2.5 (3) MG/3ML IN SOLN
3.0000 mL | Freq: Three times a day (TID) | RESPIRATORY_TRACT | Status: DC
  Administered 2019-05-04 – 2019-05-05 (×3): 3 mL via RESPIRATORY_TRACT
  Filled 2019-05-04 (×3): qty 3

## 2019-05-04 NOTE — Progress Notes (Signed)
SATURATION QUALIFICATIONS: (This note is used to comply with regulatory documentation for home oxygen)  Patient Saturations on Room Air at Rest = 91%  Patient Saturations on Room Air while Ambulating = 88%  Patient Saturations on 2 Liters of oxygen while Ambulating = 95%   Mabeline Caras, PT, DPT Acute Rehabilitation Services  Pager [phone] Office [phone]

## 2019-05-04 NOTE — Progress Notes (Addendum)
Date: 05/04/2019  Patient name: Paul [name]  Medical record number: [id number]  Date of birth: 1946-10-23   I have seen and evaluated this patient and I have discussed the plan of care with the house staff. Please see their note for complete details. I concur with their findings with the following additions/corrections: [name] is a 73 year old community dwelling man with COPD Gold stage III requiring supplemental oxygen who was admitted yesterday for dyspnea.  Additional history obtained today includes the patient stating that he returned to his respiratory baseline after admission and did well for a couple of weeks but then recently had an onset of worsening dyspnea, dyspnea on exertion, and inability to do anything that requires exertion.  As documented by Dr. Annie [name], he has been unable to taper his prednisone.  He was able to tolerate the 40 mg which he took for about 2 or 3 days but when he went to 20 mg, he had worsening in his symptoms.  Today, he states he feels a little bit better but is not yet back to his baseline.  He denies chest pain or anorexia.  He does endorse a cough chronically but recently it has been much more than usual and is very thick and occurred suddenly.  He states he was unable to sleep last night.  He states that Dr. Melvyn [name] is his pulmonologist and Dr. Tobie [name] is his PCP.  PMHx, Fam Hx, and/or Soc Hx : He lives at home with his wife and was independent in ADLs until recently.  Vitals:   05/04/19 0726 05/04/19 0822  BP:  121/74  Pulse:  96  Resp:  16  Temp:  98.4 F (36.9 C)  SpO2: 98% 97%  Afebrile since admission O2 sat mid to high 90s on 2 L nasal cannula General no acute distress, slight increase in respiratory effort but able to speak in full sentences HRRR no MRG L no airflow throughout the lungs except in the upper fields which were clear to auscultation Abdomen positive bowel sounds soft nontender Extremities warm and dry, no edema  Notable lab  findings since admission Hemoglobin 12.3 - 10.9, baseline about 12 MCV 80 RDW 14.6 COVID negative  I personally viewed the CXR images and confirmed my reading with the official read.  1 view AP portable upright, rotated, aortic calcification, left costophrenic angle cut off, no infiltrate, no flattening of the diaphragms.  I personally viewed the EKG and confirmed my reading with the official read.  Sinus rhythm, normal axis, biatrial enlargement, no ischemic changes.  Assessment and Plan: I have seen and evaluated the patient as outlined above. I agree with the formulated Assessment and Plan as detailed in the residents' note, with the following changes:  [name] is a 73 year old community dwelling man with COPD Gold stage III requiring supplemental oxygen who was admitted yesterday for dyspnea.  He has not been successful tapering his home prednisone to less than 30 mg a day.  There is no identifiable trigger for a COPD exacerbation as he has no infiltrate on chest x-ray, he is COVID negative, he has no pulmonary edema, and he is in a normal sinus rhythm.  He has been on oral prednisone since his admission at the end of March and had received 5 days of azithromycin during that hospitalization.  I agree with Dr. Annie [name] that this possibly could be progression of his COPD instead of an exacerbation.  We will try to reverse any reversible component, try another azithromycin course, and  recommend a slower prednisone taper which will be completed as an outpatient once he is medically stable.  He has good outpatient support with Dr. Melvyn [name] and Dr. Tobie [name] for outpatient follow-up..  1.  Acute on chronic hypoxic respiratory failure - an azithromycin 5-day course was started on admission which will be continued - cont prednisone 40 by mouth daily with a planned slow taper - cont home medications of Dulera - duonebs - O2 by nasal cannula  2.  Atrial fibrillation - he remains on his home diltiazem and  metoprolol along with his Eliquis.  He is not on an antiarrhythmic and remains in sinus rhythm.  3.  Lower extremity edema - he has no edema on exam today and we are holding his home Lasix.  4.  Insomnia - trial of Rozerem.  His insomnia could be due to the steroids he received or the unfamiliar hospital environment.  He is at high risk for delirium.  5. Dispo planning - PT consult.  O2 sat with ambulation.  He will be stable to be discharged once he is seeing steady improvement or stabilization in his breathing, oxygen demands, and ability to ambulate.  Paul Crews, MD 5/12/202010:18 AM

## 2019-05-04 NOTE — Evaluation (Signed)
Physical Therapy Evaluation & Discharge Patient Details Name: Ramal Eckhardt MRN: [id number] DOB: 10/18/1946 Today's Date: 05/04/2019   History of Present Illness  Pt is a 73 y.o. male admitted 05/03/19 with acute on chronic hypoxic respiratory failure. Of note, recent admission 03/2019 with similar symptoms, was d/c home on 2L O2 [place] with a couple weeks of improvement, then DOE starting getting worse again. PMH includes COPD, afib.    Clinical Impression  Patient evaluated by Physical Therapy with no further acute PT needs identified. PTA, pt indep with mobility, limited community ambulation due to DOE. Today, pt indep with mobility. Demonstrates good insight into deficits, energy conservation strategies and pursed lip/deep breathing techniques; self-monitors O2 sats at home and wears 2L O2 [place] as needed. All education has been completed and the patient has no further questions. Acute PT is signing off. Thank you for this referral.     Follow Up Recommendations No PT follow up    Equipment Recommendations  None recommended by PT    Recommendations for Other Services       Precautions / Restrictions Precautions Precautions: None Restrictions Weight Bearing Restrictions: No      Mobility  Bed Mobility Overal bed mobility: Independent                Transfers Overall transfer level: Independent                  Ambulation/Gait Ambulation/Gait assistance: Independent Gait Distance (Feet): 320 Feet Assistive device: None Gait Pattern/deviations: Step-through pattern;Decreased stride length Gait velocity: Decreased Gait velocity interpretation: 1.31 - 2.62 ft/sec, indicative of limited community ambulator General Gait Details: Independent ambulation. SpO2 down to 88% on RA; 95% on 2L O2 [place]  Stairs            Wheelchair Mobility    Modified Rankin (Stroke Patients Only)       Balance Overall balance assessment: Needs assistance   Sitting balance-Leahy  Scale: Good       Standing balance-Leahy Scale: Good                               Pertinent Vitals/Pain Pain Assessment: No/denies pain    Home Living Family/patient expects to be discharged to:: Private residence Living Arrangements: Spouse/significant other Available Help at Discharge: Family;Available 24 hours/day Type of Home: House Home Access: Level entry     Home Layout: One level Home Equipment: None Additional Comments: Wears 2L O2 as needed when saturations drop    Prior Function Level of Independence: Independent         Comments: Indep household ambulation; does not ambulate in community often due to DOE. Self-monitors O2 sats and wears O2 [place] as needed     Hand Dominance        Extremity/Trunk Assessment   Upper Extremity Assessment Upper Extremity Assessment: Overall WFL for tasks assessed    Lower Extremity Assessment Lower Extremity Assessment: Overall WFL for tasks assessed    Cervical / Trunk Assessment Cervical / Trunk Assessment: Normal  Communication   Communication: No difficulties  Cognition Arousal/Alertness: Awake/alert Behavior During Therapy: WFL for tasks assessed/performed Overall Cognitive Status: Within Functional Limits for tasks assessed                                        General Comments General comments (skin  integrity, edema, etc.): Good awareness of energy conservation strategies and pursed lip breathing    Exercises     Assessment/Plan    PT Assessment Patent does not need any further PT services  PT Problem List         PT Treatment Interventions      PT Goals (Current goals can be found in the Care Plan section)  Acute Rehab PT Goals Patient Stated Goal: Return home with better breathing PT Goal Formulation: All assessment and education complete, DC therapy    Frequency     Barriers to discharge        Co-evaluation               AM-PAC PT "6 Clicks"  Mobility  Outcome Measure Help needed turning from your back to your side while in a flat bed without using bedrails?: None Help needed moving from lying on your back to sitting on the side of a flat bed without using bedrails?: None Help needed moving to and from a bed to a chair (including a wheelchair)?: None Help needed standing up from a chair using your arms (e.g., wheelchair or bedside chair)?: None Help needed to walk in hospital room?: None Help needed climbing 3-5 steps with a railing? : A Little 6 Click Score: 23    End of Session Equipment Utilized During Treatment: Oxygen Activity Tolerance: Patient tolerated treatment well Patient left: in bed;with call bell/phone within reach Nurse Communication: Mobility status PT Visit Diagnosis: Other abnormalities of gait and mobility (R26.89)    Time: 2774-1287 PT Time Calculation (min) (ACUTE ONLY): 15 min   Charges:   PT Evaluation $PT Eval Moderate Complexity: [place], PT, DPT Acute Rehabilitation Services  Pager [phone] Office [place] 05/04/2019, 4:27 PM

## 2019-05-05 DIAGNOSIS — F329 Major depressive disorder, single episode, unspecified: Secondary | ICD-10-CM

## 2019-05-05 DIAGNOSIS — J302 Other seasonal allergic rhinitis: Secondary | ICD-10-CM

## 2019-05-05 DIAGNOSIS — K219 Gastro-esophageal reflux disease without esophagitis: Secondary | ICD-10-CM

## 2019-05-05 DIAGNOSIS — M7989 Other specified soft tissue disorders: Secondary | ICD-10-CM

## 2019-05-05 DIAGNOSIS — F419 Anxiety disorder, unspecified: Secondary | ICD-10-CM

## 2019-05-05 DIAGNOSIS — J441 Chronic obstructive pulmonary disease with (acute) exacerbation: Principal | ICD-10-CM

## 2019-05-05 DIAGNOSIS — E785 Hyperlipidemia, unspecified: Secondary | ICD-10-CM

## 2019-05-05 LAB — CBC
HCT: 33.7 % — ABNORMAL LOW (ref 39.0–52.0)
Hemoglobin: 10.7 g/dL — ABNORMAL LOW (ref 13.0–17.0)
MCH: 25.2 pg — ABNORMAL LOW (ref 26.0–34.0)
MCHC: 31.8 g/dL (ref 30.0–36.0)
MCV: 79.5 fL — ABNORMAL LOW (ref 80.0–100.0)
Platelets: 317 10*3/uL (ref 150–400)
RBC: 4.24 MIL/uL (ref 4.22–5.81)
RDW: 14.6 % (ref 11.5–15.5)
WBC: 11.3 10*3/uL — ABNORMAL HIGH (ref 4.0–10.5)
nRBC: 0 % (ref 0.0–0.2)

## 2019-05-05 LAB — GLUCOSE, CAPILLARY
Glucose-Capillary: 121 mg/dL — ABNORMAL HIGH (ref 70–99)
Glucose-Capillary: 139 mg/dL — ABNORMAL HIGH (ref 70–99)
Glucose-Capillary: 150 mg/dL — ABNORMAL HIGH (ref 70–99)
Glucose-Capillary: 190 mg/dL — ABNORMAL HIGH (ref 70–99)

## 2019-05-05 MED ORDER — IPRATROPIUM-ALBUTEROL 0.5-2.5 (3) MG/3ML IN SOLN
3.0000 mL | Freq: Two times a day (BID) | RESPIRATORY_TRACT | Status: DC
  Administered 2019-05-05 – 2019-05-06 (×2): 3 mL via RESPIRATORY_TRACT
  Filled 2019-05-05 (×2): qty 3

## 2019-05-05 MED ORDER — INSULIN ASPART 100 UNIT/ML ~~LOC~~ SOLN
0.0000 [IU] | Freq: Three times a day (TID) | SUBCUTANEOUS | Status: DC
  Administered 2019-05-05 (×2): 1 [IU] via SUBCUTANEOUS

## 2019-05-05 NOTE — Progress Notes (Signed)
  Date: 05/05/2019  Patient name: [name]  Medical record number: [id number]  Date of birth: June 10, 1946   I have seen and evaluated this patient and I have discussed the plan of care with the house staff. Please see their note for complete details. I concur with their findings with the following additions/corrections: [name] was seen this morning on team rounds.  He is lying in bed without any respiratory distress or use of accessory muscles.  He is on room air and his personal O2 sat monitor registered an O2 sat of 89%.  He was asymptomatic.  Lung exam reveals increased airflow on the left side but unchanged on the right.  We continued education about the natural progression of COPD and we are not certain there is any reversible component but will try everything we can to reverse anything that is reversible.  I agree with continuation of azithromycin course and a slow prednisone taper.  He likely will be stable to be discharged home tomorrow on oxygen and with follow-up with Dr. Melvyn Novas.  Bartholomew Crews, MD 05/05/2019, 3:43 PM

## 2019-05-05 NOTE — Progress Notes (Signed)
OT Screen Note  Patient Details Name: Paul [name] MRN: [id number] DOB: [birth year]   Cancelled Treatment:    Reason Eval/Treat Not Completed: OT screened, no needs identified, will sign off(per PT, pt is independent at this time.)   Ebony Hail Harold Hedge) Marsa Aris OTR/L Acute Rehabilitation Services Pager: [phone] Office: [place] 05/05/2019, 7:36 AM

## 2019-05-05 NOTE — Progress Notes (Signed)
Ambulated patient x2 laps around the unit. Patients O2 sats remained >93% on 2L [place] no SOB noted. VS remained stable. Will continue to monitor.

## 2019-05-05 NOTE — Progress Notes (Signed)
   Subjective:  He is feeling well this morning. He states his wife would like to speak with the Dr.  He uses O2 at home qhs but never during the day. He is curious if his lungs will improve over time, and we discussed that while we can control symptoms and hopefully progression but reversal of symptoms is unlikely.  When he walked yesterday, he felt somewhat tired but wants to try again today with help.   Objective:  Vital signs in last 24 hours: Vitals:   05/04/19 1705 05/04/19 2110 05/04/19 2130 05/05/19 0027  BP: (!) 111/48   (!) 117/57  Pulse: 89 87  73  Resp: 18     Temp: 98.7 F (37.1 C) 98.3 F (36.8 C)  97.7 F (36.5 C)  TempSrc: Oral Oral  Oral  SpO2: 99% 98% 94% 98%  Weight:      Height:       Gen: laying in bed, NAD Pulm: CTAB, decreased lung sounds bilaterally but improved from yesterday  Assessment/Plan:  Active Problems:   COPD exacerbation [facility])   Mr. Hauk is a 73 yo man with a medical history of COPD on 2L supplemental oxygen at home qhs, afib on eliquis, and HLD who presented with 2-3 weeks of progressively worsening dyspnea despite supplemental oxygen and chronic prednisone. Clinical presentation consistent with COPD exacerbation versus gradual progression of COPD.   COPD exacerbation/progression of disease: PFTs in 2017 showed FEV1 31%, FEV1/FVC 0.40, and DLCO 60%. Follows with pulmonology.   - more likely progression of chronic disease given gradual and persistent decline since Feb despite prednisone  - Azithromycin through 5/15  - Extended prednisone taper 40mg  daily x 7 days then decrease by 10mg  weekly  - Duonebs q6hrs scheduled - Dulera - Continue supplemental oxygen prn for O2 sat 88-92%  Afib: Currently rate-controlled with beta blocker and calcium channel blocker. - Continue home diltiazem 360mg  daily and metoprolol 25mg  BID - Continue home eliquis  Lower extremity swelling: Patient was started on lasix during last admission. Was given  empirically to see if his dyspnea would improve, but it didn't. His chronic lower extremity swelling improved and he was continued on lasix 20mg  daily. No echo in system. Pt denies hx of heart failure. Appears euvolemic without swelling today. Kidney function and K normal.  - Holding home lasix. Consider resuming if patient develops lower extremity edema.   Anxiety: Holding home ativan 0.5mg  daily prn HLD: Continue home lipitor Depression: Continue home sertraline  GERD: Continue home pepcid and protonix Seasonal allergies: Continue home claritin  Dispo: Anticipated discharge in approximately 1 day(s) pending patient's subjective improvement  Isabelle Course, MD 05/05/2019, 7:00 AM Pager: [phone]

## 2019-05-06 LAB — GLUCOSE, CAPILLARY
Glucose-Capillary: 85 mg/dL (ref 70–99)
Glucose-Capillary: 109 mg/dL — ABNORMAL HIGH (ref 70–99)

## 2019-05-06 MED ORDER — PREDNISONE 20 MG PO TABS: ORAL_TABLET | ORAL | 0 refills | Status: DC

## 2019-05-06 MED ORDER — AZITHROMYCIN 250 MG PO TABS: 250.0000 mg | ORAL_TABLET | Freq: Every day | ORAL | 0 refills | Status: AC

## 2019-05-06 MED FILL — predniSONE 20 MG TABS: 20 | 24 days supply | Qty: 27 | Fill #0

## 2019-05-06 MED FILL — AZITHROMYCIN 250 MG TABLET: 250 | 1 days supply | Qty: 1 | Fill #0

## 2019-05-06 NOTE — Care Management Important Message (Signed)
Important Message  Patient Details  Name: Paul [name] MRN: [id number] Date of Birth: [birth year]   Medicare Important Message Given:  Yes    Zenon Mayo, RN 05/06/2019, 3:21 PM

## 2019-05-06 NOTE — Progress Notes (Signed)
SATURATION QUALIFICATIONS: (This note is used to comply with regulatory documentation for home oxygen)  Patient Saturations on Room Air at Rest = 95%  Patient Saturations on Room Air while Ambulating = 94%  Patient Saturations on 0 Liters of oxygen while Ambulating = 94%  Please briefly explain why patient needs home oxygen: 

## 2019-05-06 NOTE — Consult Note (Signed)
   [facility] CM Inpatient Consult   05/06/2019  Paul [name] 1946-06-14 [id number]  Patient evaluated for high ED visits and 2 admissions in the past 6 months for COPD exacerbation.  Patient has a high risk score of 22% for unplanned readmission. Medicare NGACO.   Spoke with the patient via telephone [HIPAA verified]  in patient's room,  regarding the benefits of [facility] Care Management services. Explained that [place] Management is a covered benefit of insurance. Review information for [facility] Care Management.  Explained that [place] Management does not interfere with or replace any services arranged by the inpatient [facility] team.   Patient denies any issues with transportation, medications, food or resource needs at this time. Patient states his primary care provider is Dr. [name], [place]. This office provides post hospital follow up calls, he states he already has a follow up appointment for June.  Patient declined further EMMI calls or service needs with [place] Management very politely.  Patient did agree to information being mailed out, address confirmed.  For questions or referrals, please contact:  Natividad Brood, RN BSN [facility] Liaison  [phone] business mobile phone Toll free office [phone]  Fax number: [phone] [email or web address]

## 2019-05-06 NOTE — Progress Notes (Signed)
   Subjective:  Feeling great this morning. Walked around a lot yesterday on [place] and felt like this went well. In the bed he feels comfortable on room air. He feels weak and is worried about his breathing and feels that he needs to stay here another night. He would like to try walking without the oxygen.   Objective:  Vital signs in last 24 hours: Vitals:   05/05/19 2058 05/05/19 2341 05/06/19 0759 05/06/19 0828  BP: (!) 149/59 128/69 131/64   Pulse: 79 77 80 75  Resp:  18 18 18   Temp:  98.5 F (36.9 C) 98.7 F (37.1 C)   TempSrc:  Oral Oral   SpO2: 94% 98% 94% 95%  Weight:      Height:       Gen: laying in bed, NAD Pulm: CTAB, decreased breath sounds diffusely but unchanged from yesterday.   Assessment/Plan:  Active Problems:   COPD exacerbation [facility])  Mr. Irizarry is a 73 yo man with a medical history of COPD on 2L supplemental oxygen at home qhs, afib on eliquis, and HLD who presented with 2-3 weeks of progressively worsening dyspnea despite supplemental oxygen and chronic prednisone. Clinical presentation consistent with COPD exacerbation versus gradual progression of COPD.   COPD exacerbation/progression of disease: PFTs in 2017 showed FEV1 31%, FEV1/FVC 0.40, and DLCO 60%. Follows with pulmonology.   - more likely progression of chronic disease given gradual and persistent decline since Feb despite prednisone  - Azithromycin through 5/15  - Extended prednisone taper 40mg  daily x 7 days then decrease by 10mg  weekly  - Duonebs q6hrs scheduled - Dulera - Continue supplemental oxygen prn for O2 sat 88-92% - ambulate without [place]  Afib:Currently rate-controlled with beta blocker and calcium channel blocker. - Continue home diltiazem 360mg  daily and metoprolol 25mg  BID - Continue home eliquis  Anxiety: Holding home ativan 0.5mg  daily prn HLD: Continue home lipitor Depression: Continue home sertraline  GERD: Continue home pepcid and protonix Seasonal allergies: Continue  home claritin   Dispo: Anticipated discharge in approximately 1 day(s).   Isabelle Course, MD 05/06/2019, 10:21 AM Pager: [phone]

## 2019-05-06 NOTE — Progress Notes (Signed)
  Date: 05/06/2019  Patient name: Paul [name]  Medical record number: [id number]  Date of birth: February 10, 1946   I have seen and evaluated this patient and I have discussed the plan of care with the house staff. Please see their note for complete details. I concur with their findings with the following additions/corrections: [name] was seen this morning on team rounds.  He was able to ambulate on room air without desaturation.  I agree with Dr. Cristal Generous plan to discharge him to home to complete his azithromycin course and prednisone taper.  Bartholomew Crews, MD 05/06/2019, 2:10 PM

## 2019-05-06 NOTE — TOC Transition Note (Signed)
Transition of Care [facility]) - CM/SW Discharge Note   Patient Details  Name: Paul [name] MRN: [id number] Date of Birth: 05/02/1946  [facility] CM/SW Contact:  Zenon Mayo, RN Phone Number: 05/06/2019, 3:40 PM   Clinical Narrative:    From home with wife, pta indep, for dc today, he has transportation, he has no barriers with getting medications, he has PCP follow up apt already scheduled in June.  No other needs.     Final next level of care: Home/Self Care Barriers to Discharge: No Barriers Identified   Patient Goals and CMS Choice Patient states their goals for this hospitalization and ongoing recovery are:: get well   Choice offered to / list presented to : NA  Discharge Placement                       Discharge Plan and Services In-house Referral: NA Discharge Planning Services: CM Consult Post Acute Care Choice: NA          DME Arranged: N/A DME Agency: NA       HH Arranged: NA HH Agency: NA        Social Determinants of Health (SDOH) Interventions     Readmission Risk Interventions Readmission Risk Prevention Plan 05/06/2019  Transportation Screening Complete  PCP or Specialist Appt within 3-5 Days Complete  HRI or [place] Complete  Social Work Consult for [place] Planning/Counseling Complete  Palliative Care Screening Complete  Medication Review [employer]) Complete  Some recent data might be hidden

## 2019-05-06 NOTE — Discharge Summary (Signed)
Name: Paul [name] MRN: [id number] DOB: 06/11/1946 73 y.o. PCP: [name], MD  Date of Admission: 05/03/2019  1:02 PM Date of Discharge: 05/06/19 Attending Physician: Bartholomew Crews, MD  Discharge Diagnosis: 1. COPD  Discharge Medications: Allergies as of 05/06/2019   No Known Allergies     Medication List    TAKE these medications   ProAir HFA 108 (90 Base) MCG/ACT inhaler Generic drug:  albuterol Inhale 2 puffs into the lungs every 6 (six) hours as needed for wheezing or shortness of breath.   albuterol (2.5 MG/3ML) 0.083% nebulizer solution Commonly known as:  PROVENTIL Take 3 mLs (2.5 mg total) by nebulization every 6 (six) hours as needed for wheezing or shortness of breath.   apixaban 5 MG Tabs tablet Commonly known as:  ELIQUIS Take 1 tablet (5 mg total) by mouth 2 (two) times daily. Notes to patient:  Today at 0845   atorvastatin 40 MG tablet Commonly known as:  LIPITOR Take 1 tablet (40 mg total) by mouth daily after supper. Notes to patient:  Yesterday at 1708   azithromycin 250 MG tablet Commonly known as:  ZITHROMAX Take 1 tablet (250 mg total) by mouth daily for 1 day. Start taking on:  May 07, 2019 Notes to patient:  Today at 0845   budesonide-formoterol 160-4.5 MCG/ACT inhaler Commonly known as:  Symbicort Inhale 2 puffs into the lungs 2 (two) times daily.   diltiazem 360 MG 24 hr capsule Commonly known as:  CARDIZEM CD Take 1 capsule (360 mg total) by mouth daily. Notes to patient:  Today at 73   doxylamine (Sleep) 25 MG tablet Commonly known as:  UNISOM Take 25 mg by mouth at bedtime as needed for sleep.   famotidine 20 MG tablet Commonly known as:  Pepcid One hour before  bedtime What changed:    how much to take  how to take this  when to take this  additional instructions Notes to patient:  Yesterday at 2115   Flutter Devi Use as directed What changed:    when to take this  additional instructions   furosemide  20 MG tablet Commonly known as:  LASIX Take 1 tablet (20 mg total) by mouth daily.   Investigational - Study Medication Apply 1 patch topically See admin instructions. Patient wears a patch ("Long term A-FIB MONITOR") that has an embedded sensor/chip- is mailed in as directed by provider   loratadine 10 MG tablet Commonly known as:  CLARITIN Take 10 mg by mouth daily. Notes to patient:  Today at 0845   LORazepam 0.5 MG tablet Commonly known as:  ATIVAN Take 0.5 mg by mouth daily as needed for anxiety.   metoprolol tartrate 25 MG tablet Commonly known as:  LOPRESSOR Take 1 tablet (25 mg total) by mouth 2 (two) times daily. Notes to patient:  Today at 0844   Mucinex Fast-Max 10-650-400 MG/20ML Liqd Generic drug:  Phenylephrine-APAP-guaiFENesin Take 15 mLs by mouth as needed (to loosen phlegm).   OXYGEN Inhale 2 L into the lungs continuous.   pantoprazole 40 MG tablet Commonly known as:  Protonix Take 1 tablet (40 mg total) by mouth daily. Take 30-60 min before first meal of the day Notes to patient:  Today at 0845   predniSONE 20 MG tablet Commonly known as:  DELTASONE Take 2 tablets (40 mg total) by mouth daily with breakfast for 3 days, THEN 1.5 tablets (30 mg total) daily with breakfast for 7 days, THEN 1 tablet (20 mg total)  daily with breakfast for 7 days, THEN 0.5 tablets (10 mg total) daily with breakfast for 7 days. Start taking on:  May 07, 2019 What changed:    medication strength  See the new instructions. Notes to patient:  Today at 0844   sertraline 50 MG tablet Commonly known as:  ZOLOFT Take 50 mg by mouth daily. Notes to patient:  Today at 73   Tiotropium Bromide Monohydrate 2.5 MCG/ACT Aers Commonly known as:  Spiriva Respimat Inhale 2 puffs into the lungs daily at 3 pm. What changed:  when to take this       Disposition and follow-up:   Paul [name] was [facility] from [facility] in Good condition.  At the hospital  follow up visit please address:  COPD: more likely disease progression than exacerbation. Discharged on extended prednisone taper 40mg  x 7 days, decrease by 10mg  q7days   2.  Labs / imaging needed at time of follow-up: none  3.  Pending labs/ test needing follow-up: none   Follow-up Appointments: f/u with pulmonology in one week   Hospital Course by problem list:  COPD exacerbation/progression of disease Paul [name] is a 73 yo man with a medical history of COPD on 2L supplemental oxygen at home qhs, afib on eliquis, and HLD who presented with 2-3 weeks of progressively worsening dyspnea despite supplemental oxygen and chronic prednisone. After discussion with his pulmonologist, he was instructed to come to the ED. He endorses chronic cough and thick sputum production without recent changes. He denies fevers, URI symptoms, or sick contacts. He has been using his Symbicort and Spiriva daily and has required his Proair inhaler BID. He also uses albuterol neb prn.   Clinical presentation consistent with COPD exacerbation versus gradual progression of COPD. PFTs in 2017 showed FEV1 31%, FEV1/FVC 0.40, and DLCO 60%. Vital signs were stable on presentation, O2 sat >90% on 2L [place]. No signs/symptoms of infection. CXR without pneumonia. COVID negative. He received 10mg  decadron and duoneb inhalers in the ED with minimal improvement in symptoms. Treated with 5 days azithromycin and a prolonged prednisone taper. Was able to ambulate without supplemental oxygen by hospital day 3.   Afib:NSR during admission, rate controlled diltiazem 360mg  daily and metoprolol 25mg  BID and eliquis  Discharge Vitals:   BP 131/64   Pulse 75   Temp 98.7 F (37.1 C) (Oral)   Resp 18   Ht 5\' 6"  (1.676 m)   Wt 59.2 kg   SpO2 95%   BMI 21.07 kg/m   Pertinent Labs, Studies, and Procedures:   CXR: There is hyperinflation of the lungs compatible with COPD. Heart and mediastinal contours are within normal limits. No  focal opacities or effusions. No acute bony abnormality.  IMPRESSION: COPD.  No active disease.  CBC Latest Ref Rng & Units 05/05/2019 05/04/2019 05/03/2019  WBC 4.0 - 10.5 K/uL 11.3(H) 8.7 14.2(H)  Hemoglobin 13.0 - 17.0 g/dL 10.7(L) 10.9(L) 12.3(L)  Hematocrit 39.0 - 52.0 % 33.7(L) 34.3(L) 41.0  Platelets 150 - 400 K/uL 317 325 394      Discharge Instructions: Discharge Instructions    Diet - low sodium heart healthy   Complete by:  As directed    Discharge instructions   Complete by:  As directed    Paul [name], Paul [name] were admitted to the hospital for trouble breathing due to your COPD. We did not find any evidence of a pneumonia. Your breathing improving with steroids (prednisone) and an antibiotic (Azithromycin). You have one  more day of Azithromycin. Please continue taking Prednisone as prescribed and follow up with Dr. Melvyn Novas next week. Use your oxygen at home as needed and remember, your goal oxygen saturation is 88-92%.   If you experience worsening shortness of breath, chest pain, productive cough, or fever please call your doctor.   Increase activity slowly   Complete by:  As directed       Signed: Isabelle Course, MD 05/06/2019, 3:16 PM   Pager: [phone]

## 2019-05-14 ENCOUNTER — Ambulatory Visit (INDEPENDENT_AMBULATORY_CARE_PROVIDER_SITE_OTHER): Payer: Medicare Other | Admitting: Nurse Practitioner

## 2019-05-14 ENCOUNTER — Encounter: Payer: Self-pay | Admitting: Nurse Practitioner

## 2019-05-14 ENCOUNTER — Other Ambulatory Visit: Payer: Self-pay

## 2019-05-14 VITALS — BP 128/60 | HR 77 | Temp 98.1°F | Ht 66.0 in | Wt 133.8 lb

## 2019-05-14 DIAGNOSIS — J9611 Chronic respiratory failure with hypoxia: Secondary | ICD-10-CM | POA: Diagnosis not present

## 2019-05-14 DIAGNOSIS — J441 Chronic obstructive pulmonary disease with (acute) exacerbation: Secondary | ICD-10-CM | POA: Diagnosis not present

## 2019-05-14 MED ORDER — TIOTROPIUM BROMIDE MONOHYDRATE 2.5 MCG/ACT IN AERS: 2.0000 | INHALATION_SPRAY | Freq: Every day | RESPIRATORY_TRACT | 0 refills | Status: DC

## 2019-05-14 MED ORDER — AZITHROMYCIN 250 MG PO TABS: ORAL_TABLET | ORAL | 0 refills | Status: DC

## 2019-05-14 MED ORDER — BUDESONIDE-FORMOTEROL FUMARATE 160-4.5 MCG/ACT IN AERO: 2.0000 | INHALATION_SPRAY | Freq: Two times a day (BID) | RESPIRATORY_TRACT | 0 refills | Status: DC

## 2019-05-14 NOTE — Assessment & Plan Note (Addendum)
COPD requiring hospitalization is now resolving. Patient is much improved.   Patient Instructions  Plan A = Automatic = symbicort 160 Take 2 puffs first thing in am and then another 2 puffs about 12 hours later.  Spiriva x 2 puffs  right after symbicort Pantoprazole (protonix) 40 mg   Take  30-60 min before first meal of the day and Pepcid (famotidine)  20 mg one hour before bedtime until return to office - this is the best way to tell whether stomach acid is contributing to your problem.   Prednisone is 10 mg   4 if worse, 2 until better, then 1 daily until seen GERD diet   Plan B = Backup Only use your albuterol inhaler(Proventil/Proair)  as a rescue medication Plan C = Crisis - only use your albuterol nebulizer if you first try Plan B and it fails to help Plan D = Deltasone  See prednisone dosing above  For cough > mucinex or mucinex dm up to 1200 mg every 12 hours and use flutter valve as much as you can and if can't quit coughing then use Tylenol #3 one every 4 hours as needed    O2  2lpm at bedtime and adjust to keep it over 90% with activity   Continue prednisone taper from ED discharge. When finished cointinue on 5 mg daily until return visit with Dr. Melvyn Novas.  Will order Azithromycin to hold in case symptoms worsen over the holiday weekend  Follow up: Follow up with Dr. Melvyn Novas in 1 month or sooner if needed

## 2019-05-14 NOTE — Progress Notes (Signed)
@Patient  ID: Paul [name], male    DOB: 1946/09/11, 73 y.o.   MRN: [id number]  Chief Complaint  Patient presents with  . Hospitalization Follow-up    Breathing is back at his normal baseline. He has occ cough with clear sputum. He has only been using his o2 with sleep.     Referring provider: [name], MD  HPI  73 year old male former smoker with COPD Gold stage III, and chronic respiratory failure who is followed by Dr. Melvyn Novas.  Tests/sinificant events: Quit smoking 01/2009  02/04/12 [name]: FEV1 36% FVC 57% FEF 25 75    11% -05/29/16  FEV1 0.83 L (31%) FEV1/FVC 0.40 FEF 25-75 0.30 L (14%) no bronchodilator response p ? rx prior, DLCO corrected 60% (hemoglobin 10.9) - 03/29/2019   continue symb 160 2bid and spiriva smi 2 puffs each am  - 04/15/2019  After extensive coaching inhaler device,  effectiveness =    90% with both hfa and smi but not understanding contingencies for hfa/ neb  - 04/15/2019 placed on daily pred with ceiling 40 and floor of 10 mg daily and empirical rx for GERD - 04/15/2019 flutter valve training   02/06/12 ONO RA: desat <88% 7 episodes per hour, 66 episodes total 01/27/2013 ONO RA desat < 88% 53min of recording , still needs nocturnal oxygen - 03/25/2019 d/c from admit on 2lpm but not using x at hs  - 04/15/2019 02 sats 94% at rest despite flare in copd so rec 2lpm  Hs and prn activity  to keep sats > 90% at all times   As of 05/14/2019   2lpm hs and prn daytime    OV 05/14/19 - Hospital follow up 05/03/19 - 05/06/19 Patient presents today for hospital follow-up.  Was admitted to the hospital on 05/03/2027 with COPD exacerbation.  Patient presented to the hospital with 2 to 3 weeks of progressively worsening dyspnea.  Patient was treated with a 5-day course of azithromycin and a prolonged prednisone taper.  Chest x-ray hospital showed COPD with no focal opacities or effusions.  Patient states that since discharge she has been doing well.  Has not had to use supplemental  oxygen during the day but continues to use oxygen 2 L at night.  He has been checking his O2 sats and states that sats have remained around 93 to 94% on room air.  Patient is compliant with Symbicort and Spiriva.  He is compliant with Protonix.  He is still finishing his prednisone taper this time.  He has albuterol inhaler and nebulizer to use as needed.  Denies any recent fever. Denies n/v/d, hemoptysis, PND, leg swelling.     No Known Allergies  Immunization History  Administered Date(s) Administered  . Influenza Split 09/22/2013, 09/01/2015  . Influenza Whole 09/04/2011, 09/21/2012  . Influenza, High Dose Seasonal PF 09/02/2016, 09/10/2017, 08/31/2018  . Influenza-Unspecified 08/23/2014  . Pneumococcal Conjugate-13 01/11/2014  . Pneumococcal Polysaccharide-23 08/23/2010    Past Medical History:  Diagnosis Date  . A-fib ([place])   . COPD (chronic obstructive pulmonary disease) ([place])   . High cholesterol     Tobacco History: Social History   Tobacco Use  Smoking Status Former Smoker  . Packs/day: 2.00  . Years: 52.00  . Pack years: 104.00  . Types: Cigarettes  . Last attempt to quit: 02/03/2009  . Years since quitting: 10.2  Smokeless Tobacco Never Used  Tobacco Comment   Counseled to remain smoke free   Counseling given: Not Answered Comment: Counseled to remain  smoke free   Outpatient Encounter Medications as of 05/14/2019  Medication Sig  . albuterol (PROAIR HFA) 108 (90 Base) MCG/ACT inhaler Inhale 2 puffs into the lungs every 6 (six) hours as needed for wheezing or shortness of breath.  Marland Kitchen albuterol (PROVENTIL) (2.5 MG/3ML) 0.083% nebulizer solution Take 3 mLs (2.5 mg total) by nebulization every 6 (six) hours as needed for wheezing or shortness of breath.  Marland Kitchen apixaban (ELIQUIS) 5 MG TABS tablet Take 1 tablet (5 mg total) by mouth 2 (two) times daily.  Marland Kitchen atorvastatin (LIPITOR) 40 MG tablet Take 1 tablet (40 mg total) by mouth daily after supper.  .  budesonide-formoterol (SYMBICORT) 160-4.5 MCG/ACT inhaler Inhale 2 puffs into the lungs 2 (two) times daily.  Marland Kitchen diltiazem (CARDIZEM CD) 360 MG 24 hr capsule Take 1 capsule (360 mg total) by mouth daily.  Marland Kitchen doxylamine, Sleep, (UNISOM) 25 MG tablet Take 25 mg by mouth at bedtime as needed for sleep.  . famotidine (PEPCID) 20 MG tablet One hour before  bedtime (Patient taking differently: Take 20 mg by mouth daily after supper. )  . furosemide (LASIX) 20 MG tablet Take 1 tablet (20 mg total) by mouth daily.  Marland Kitchen loratadine (CLARITIN) 10 MG tablet Take 10 mg by mouth daily.  Marland Kitchen LORazepam (ATIVAN) 0.5 MG tablet Take 0.5 mg by mouth daily as needed for anxiety.   . metoprolol tartrate (LOPRESSOR) 25 MG tablet Take 1 tablet (25 mg total) by mouth 2 (two) times daily.  . OXYGEN Inhale 2 L into the lungs continuous.   . pantoprazole (PROTONIX) 40 MG tablet Take 1 tablet (40 mg total) by mouth daily. Take 30-60 min before first meal of the day  . predniSONE (DELTASONE) 20 MG tablet Take 2 tablets (40 mg total) by mouth daily with breakfast for 3 days, THEN 1.5 tablets (30 mg total) daily with breakfast for 7 days, THEN 1 tablet (20 mg total) daily with breakfast for 7 days, THEN 0.5 tablets (10 mg total) daily with breakfast for 7 days.  Marland Kitchen Respiratory Therapy Supplies (FLUTTER) DEVI Use as directed (Patient taking differently: as directed. )  . sertraline (ZOLOFT) 50 MG tablet Take 50 mg by mouth daily.  . Tiotropium Bromide Monohydrate (SPIRIVA RESPIMAT) 2.5 MCG/ACT AERS Inhale 2 puffs into the lungs daily at 3 pm. (Patient taking differently: Inhale 2 puffs into the lungs daily. )  . azithromycin (ZITHROMAX) 250 MG tablet Take 2 tablets (500 mg) on day 1, then take 1 tablet (250 mg) on days 2-5  . budesonide-formoterol (SYMBICORT) 160-4.5 MCG/ACT inhaler Inhale 2 puffs into the lungs 2 (two) times a day.  Marland Kitchen Phenylephrine-APAP-guaiFENesin (MUCINEX FAST-MAX) 10-650-400 MG/20ML LIQD Take 15 mLs by mouth as needed  (to loosen phlegm).   . Tiotropium Bromide Monohydrate (SPIRIVA RESPIMAT) 2.5 MCG/ACT AERS Inhale 2 puffs into the lungs daily.  . [DISCONTINUED] Investigational - Study Medication Apply 1 patch topically See admin instructions. Patient wears a patch ("Long term A-FIB MONITOR") that has an embedded sensor/chip- is mailed in as directed by provider   No facility-administered encounter medications on file as of 05/14/2019.      Review of Systems  Review of Systems  Constitutional: Negative.  Negative for chills and fever.  HENT: Negative.   Respiratory: Negative for cough, shortness of breath and wheezing.   Cardiovascular: Negative.  Negative for chest pain, palpitations and leg swelling.  Gastrointestinal: Negative.   Allergic/Immunologic: Negative.   Neurological: Negative.   Psychiatric/Behavioral: Negative.  Physical Exam  BP 128/60 (BP Location: Left Arm, Cuff Size: Normal)   Pulse 77   Temp 98.1 F (36.7 C) (Oral)   Ht 5\' 6"  (1.676 m)   Wt 133 lb 12.8 oz (60.7 kg)   SpO2 96%   BMI 21.60 kg/m   Wt Readings from Last 5 Encounters:  05/14/19 133 lb 12.8 oz (60.7 kg)  05/03/19 130 lb 8.2 oz (59.2 kg)  04/15/19 138 lb (62.6 kg)  04/13/19 137 lb (62.1 kg)  04/06/19 140 lb (63.5 kg)     Physical Exam Vitals signs and nursing note reviewed.  Constitutional:      General: He is not in acute distress.    Appearance: He is well-developed.  Cardiovascular:     Rate and Rhythm: Normal rate and regular rhythm.  Pulmonary:     Effort: Pulmonary effort is normal. No respiratory distress.     Breath sounds: Normal breath sounds. No wheezing or rhonchi.  Musculoskeletal:        General: No swelling.  Skin:    General: Skin is warm and dry.  Neurological:     Mental Status: He is alert and oriented to person, place, and time.      Imaging: Dg Chest Port 1 View  Result Date: 05/03/2019 CLINICAL DATA:  COPD, shortness of breath EXAM: PORTABLE CHEST 1 VIEW  COMPARISON:  04/14/2019 FINDINGS: There is hyperinflation of the lungs compatible with COPD. Heart and mediastinal contours are within normal limits. No focal opacities or effusions. No acute bony abnormality. IMPRESSION: COPD.  No active disease. Electronically Signed   By: Rolm Baptise M.D.   On: 05/03/2019 13:48   Dg Chest Port 1 View  Result Date: 04/14/2019 CLINICAL DATA:  73 year old male with a history of chest pain EXAM: PORTABLE CHEST 1 VIEW COMPARISON:  03/19/2019, CT 02/23/2019 FINDINGS: Cardiomediastinal silhouette within normal limits. No pneumothorax or pleural effusion. Plain film demonstrates emphysema with hyperinflation and flattened hemidiaphragms. No confluent airspace disease with coarsened interstitial markings. No displaced fracture. IMPRESSION: Chronic lung changes without evidence of acute cardiopulmonary disease Electronically Signed   By: Corrie Mckusick D.O.   On: 04/14/2019 21:23     Assessment & Plan:   COPD exacerbation ([place]) COPD requiring hospitalization is now resolving. Patient is much improved.   Patient Instructions  Plan A = Automatic = symbicort 160 Take 2 puffs first thing in am and then another 2 puffs about 12 hours later.  Spiriva x 2 puffs  right after symbicort Pantoprazole (protonix) 40 mg   Take  30-60 min before first meal of the day and Pepcid (famotidine)  20 mg one hour before bedtime until return to office - this is the best way to tell whether stomach acid is contributing to your problem.   Prednisone is 10 mg   4 if worse, 2 until better, then 1 daily until seen GERD diet   Plan B = Backup Only use your albuterol inhaler(Proventil/Proair)  as a rescue medication Plan C = Crisis - only use your albuterol nebulizer if you first try Plan B and it fails to help Plan D = Deltasone  See prednisone dosing above  For cough > mucinex or mucinex dm up to 1200 mg every 12 hours and use flutter valve as much as you can and if can't quit coughing then  use Tylenol #3 one every 4 hours as needed    O2  2lpm at bedtime and adjust to keep it over 90% with activity  Continue prednisone taper from ED discharge. When finished cointinue on 5 mg daily until return visit with Dr. Melvyn Novas.  Will order Azithromycin to hold in case symptoms worsen over the holiday weekend  Follow up: Follow up with Dr. Melvyn Novas in 1 month or sooner if needed    Chronic respiratory failure with hypoxia ([place]) O2  2lpm at bedtime and adjust to keep it over 90% with activity      Fenton Foy, NP 05/14/2019

## 2019-05-14 NOTE — Assessment & Plan Note (Signed)
O2  2lpm at bedtime and adjust to keep it over 90% with activity

## 2019-05-14 NOTE — Patient Instructions (Addendum)
Plan A = Automatic = symbicort 160 Take 2 puffs first thing in am and then another 2 puffs about 12 hours later.  Spiriva x 2 puffs  right after symbicort Pantoprazole (protonix) 40 mg   Take  30-60 min before first meal of the day and Pepcid (famotidine)  20 mg one hour before bedtime until return to office - this is the best way to tell whether stomach acid is contributing to your problem.   Prednisone is 10 mg   4 if worse, 2 until better, then 1 daily until seen GERD diet   Plan B = Backup Only use your albuterol inhaler(Proventil/Proair)  as a rescue medication Plan C = Crisis - only use your albuterol nebulizer if you first try Plan B and it fails to help Plan D = Deltasone  See prednisone dosing above  For cough > mucinex or mucinex dm up to 1200 mg every 12 hours and use flutter valve as much as you can and if can't quit coughing then use Tylenol #3 one every 4 hours as needed    O2  2lpm at bedtime and adjust to keep it over 90% with activity   Continue prednisone taper from ED discharge. When finished cointinue on 5 mg daily until return visit with Dr. Melvyn Novas.  Will order Azithromycin to hold in case symptoms worsen over the holiday weekend  Follow up: Follow up with Dr. Melvyn Novas in 1 month or sooner if needed

## 2019-05-20 ENCOUNTER — Telehealth: Payer: Self-pay | Admitting: Cardiology

## 2019-05-20 NOTE — Telephone Encounter (Signed)
 *  STAT* If patient is at the pharmacy, call can be transferred to refill team.   1. Which medications need to be refilled? (please list name of each medication and dose if known) diltiazem (CARDIZEM CD) 360 MG 24 hr capsule  2. Which pharmacy/location (including street and city if local pharmacy) is medication to be sent to? [facility]  3. Do they need a 30 day or 90 day supply? 90 day

## 2019-05-20 NOTE — Telephone Encounter (Signed)
Called pt to inform him that his medication was sent to his pharmacy in April with a year supply and if he has any other problems, questions or concerns to give our office a call back. Pt verbalized understanding.

## 2019-05-24 NOTE — Progress Notes (Signed)
Chart and office note reviewed in detail  > agree with a/p as outlined    

## 2019-05-27 ENCOUNTER — Telehealth: Payer: Self-pay | Admitting: Nurse Practitioner

## 2019-05-27 ENCOUNTER — Telehealth: Payer: Self-pay | Admitting: Cardiology

## 2019-05-27 ENCOUNTER — Ambulatory Visit: Payer: Medicare Other | Admitting: Internal Medicine

## 2019-05-27 NOTE — Telephone Encounter (Signed)
New message:   Patient wife calling concerning her husband is having some pain on his back on the left side. Patient also has COPD. Please call patient wife back.

## 2019-05-27 NOTE — Telephone Encounter (Signed)
Spoke with patient's wife who states patient has been having pain in his left back near his shoulder blade for the past few days States pain began when prednisone dose decreased to 10 mg daily on Monday. States pain is not reproducible but is associated with increased coughing  States oxygen sat 76% earlier this morning with O2 at 2L but has now improved HR 90 bpm currently. Patient has only had inhalers this morning; is getting ready to eat breakfast and take the remaining medications.  I advised wife to notify pulmonologist about increased cough and decreased O2 sats since decreasing prednisone. I advised her to call back if patient has HR consistently > 120 bpm or other concerns related to a fib. She verbalized understanding and agreement and thanked me for the call.

## 2019-05-27 NOTE — Telephone Encounter (Signed)
Ok to increase to prednisone 20 mg daily but doubt that's going to be enough and not possible to eval cp s ov with cxr so set up with NP but if getting worse in meantime will need to go to ER   Be sure observing also the ABC plan outlined and note the plan

## 2019-05-27 NOTE — Telephone Encounter (Signed)
Returned call and spoke with wife [name].  She states patient is 'already much better.'  Also states patient had been outside in high humidity for several hours yesterday and wonders if this could have affected him. Advised patient should limit time outdoors during flare ups with his breathing. Also, reviewed Dr. Gustavus Bryant comments regarding the prednisone and follow up office visit with CXR/ NP.   Wife states since he is 'so much better now' after taking 20mg  prednisone that they prefer to wait on OV but will notify us if he does not continue to improve or worsens and will not hesitate to go to ED if condition deteriorates.  Offered OV today.  Wife believes his current improved condition will allow patient to defer appointment this week but she agreed to make appointment if patient condition begins to decline again.   Reviewed ABC plan from [place] with wife and she agrees patient has been compliant with all measures.  Used nebulizer 3 times yesterday and also using albuterol inhaler.    Wife or patient will call us in 2 days to update [place] on condition if he has not yet made OV appointment.  Patient does have appt with Dr. Melvyn Novas June 23.  Patient will continue 20mg  of prednisone as directed by Dr. Melvyn Novas.  Nothing further needed at this time.

## 2019-05-27 NOTE — Telephone Encounter (Signed)
Primary Pulmonologist: MW Last office visit and with whom: 05/14/2019 with [place] What do we see them for (pulmonary problems): COPD Last OV assessment/plan: Instructions  Return in about 1 month (around 06/14/2019) for follow up Dr. Melvyn Novas.  Plan A = Automatic = symbicort 160 Take 2 puffsfirst thing in amand then another 2 puffs about 12 hours later.  Spiriva x 2 puffs right after symbicort Pantoprazole (protonix) 40 mg Take 30-60 min before first meal of the day and Pepcid (famotidine) 20 mg one hour before bedtime until return to office - this is the best way to tell whether stomach acid is contributing to your problem.  Prednisone is 10 mg 4 if worse, 2 until better, then 1 daily until seen [place] B = Backup Only use your albuterol inhaler(Proventil/Proair) as a rescue medication Plan C = Crisis - only use your albuterol nebulizer if you first try Plan B and it fails to help Plan D = Deltasone  See prednisone dosing above  For cough >mucinex or mucinex dm up to 1200 mg every 12 hours and use flutter valve as much as you can and if can't quit coughing then use Tylenol #3 one every 4 hours as needed   O2 2lpm at bedtime and adjust to keep it over 90% with activity   Continue prednisone taper from ED discharge. When finished cointinue on 5 mg daily until return visit with Dr. Melvyn Novas.  Will order Azithromycin to hold in case symptoms worsen over the holiday weekend  Follow up: Follow up with Dr. Melvyn Novas in 1 month or sooner if needed       Was appointment offered to patient (explain)?  Pt and wife [name] want recommendations   Reason for call: Called and spoke with pt's wife [name] who stated 3 days ago pt's symptoms began to increase and she stated yesterday 6/3 pt is now coughing more after the prednisone dosage was decreased from 20mg  down to 10mg . Pt had to put his O2 on around 9pm and then around 4:15am, pt woke up having trouble breathing and had to do a neb treatment.  At this time before the neb treatment, pt's sats went down to 74% on his O2 and this was just walking from the bedroom to the kitchen.  [name] stated that this morning due to how pt was overnight, she did give pt 20mg  prednisone instead of the 10mg .  [name] has also stated that pt has some pain in his rib cage and she is not sure if this is coming from pt coughing.  After pt had the 20mg  prednisone this morning, [name] stated pt is doing better and has had not had the oxygen on for about an hour and pt's O2 sats are 92% on room air.  Due to this, pt and wife [name] are wanting recommendations and are wondering if pt's prednisone dose can be increased as he was doing better on the 20mg  prednisone vs 10mg  prednisone. Dr. Melvyn Novas, please advise on this for pt. Thanks!

## 2019-05-28 ENCOUNTER — Other Ambulatory Visit: Payer: Self-pay

## 2019-05-28 ENCOUNTER — Ambulatory Visit (INDEPENDENT_AMBULATORY_CARE_PROVIDER_SITE_OTHER): Payer: Medicare Other

## 2019-05-28 ENCOUNTER — Ambulatory Visit (INDEPENDENT_AMBULATORY_CARE_PROVIDER_SITE_OTHER): Payer: Medicare Other | Admitting: Nurse Practitioner

## 2019-05-28 ENCOUNTER — Inpatient Hospital Stay (HOSPITAL_COMMUNITY)
Admission: EM | Admit: 2019-05-28 | Discharge: 2019-06-03 | DRG: 190 | Disposition: A | Discharge status: Home / Self Care | Payer: Medicare Other | Attending: Internal Medicine | Admitting: Internal Medicine

## 2019-05-28 ENCOUNTER — Emergency Department (HOSPITAL_COMMUNITY): Payer: Medicare Other

## 2019-05-28 ENCOUNTER — Encounter: Payer: Self-pay | Admitting: Nurse Practitioner

## 2019-05-28 ENCOUNTER — Encounter (HOSPITAL_COMMUNITY): Payer: Self-pay | Admitting: Emergency Medicine

## 2019-05-28 ENCOUNTER — Other Ambulatory Visit: Payer: Self-pay | Admitting: General Surgery

## 2019-05-28 VITALS — BP 144/78 | HR 102 | Temp 98.2°F | Ht 66.0 in | Wt 134.2 lb

## 2019-05-28 DIAGNOSIS — E785 Hyperlipidemia, unspecified: Secondary | ICD-10-CM | POA: Diagnosis present

## 2019-05-28 DIAGNOSIS — S22060A Wedge compression fracture of T7-T8 vertebra, initial encounter for closed fracture: Secondary | ICD-10-CM | POA: Diagnosis not present

## 2019-05-28 DIAGNOSIS — R918 Other nonspecific abnormal finding of lung field: Secondary | ICD-10-CM

## 2019-05-28 DIAGNOSIS — Z20828 Contact with and (suspected) exposure to other viral communicable diseases: Secondary | ICD-10-CM | POA: Diagnosis not present

## 2019-05-28 DIAGNOSIS — Z7951 Long term (current) use of inhaled steroids: Secondary | ICD-10-CM

## 2019-05-28 DIAGNOSIS — Z9981 Dependence on supplemental oxygen: Secondary | ICD-10-CM

## 2019-05-28 DIAGNOSIS — J9611 Chronic respiratory failure with hypoxia: Secondary | ICD-10-CM | POA: Diagnosis present

## 2019-05-28 DIAGNOSIS — J129 Viral pneumonia, unspecified: Secondary | ICD-10-CM | POA: Diagnosis not present

## 2019-05-28 DIAGNOSIS — R0602 Shortness of breath: Secondary | ICD-10-CM

## 2019-05-28 DIAGNOSIS — I480 Paroxysmal atrial fibrillation: Secondary | ICD-10-CM | POA: Diagnosis present

## 2019-05-28 DIAGNOSIS — Z7901 Long term (current) use of anticoagulants: Secondary | ICD-10-CM

## 2019-05-28 DIAGNOSIS — K219 Gastro-esophageal reflux disease without esophagitis: Secondary | ICD-10-CM | POA: Diagnosis present

## 2019-05-28 DIAGNOSIS — F419 Anxiety disorder, unspecified: Secondary | ICD-10-CM | POA: Diagnosis present

## 2019-05-28 DIAGNOSIS — M4854XA Collapsed vertebra, not elsewhere classified, thoracic region, initial encounter for fracture: Secondary | ICD-10-CM | POA: Diagnosis present

## 2019-05-28 DIAGNOSIS — Z87891 Personal history of nicotine dependence: Secondary | ICD-10-CM

## 2019-05-28 DIAGNOSIS — D649 Anemia, unspecified: Secondary | ICD-10-CM

## 2019-05-28 DIAGNOSIS — Z79899 Other long term (current) drug therapy: Secondary | ICD-10-CM

## 2019-05-28 DIAGNOSIS — J441 Chronic obstructive pulmonary disease with (acute) exacerbation: Principal | ICD-10-CM | POA: Diagnosis present

## 2019-05-28 DIAGNOSIS — F329 Major depressive disorder, single episode, unspecified: Secondary | ICD-10-CM | POA: Diagnosis present

## 2019-05-28 DIAGNOSIS — J189 Pneumonia, unspecified organism: Secondary | ICD-10-CM | POA: Diagnosis not present

## 2019-05-28 DIAGNOSIS — J440 Chronic obstructive pulmonary disease with acute lower respiratory infection: Secondary | ICD-10-CM | POA: Diagnosis present

## 2019-05-28 DIAGNOSIS — Z7952 Long term (current) use of systemic steroids: Secondary | ICD-10-CM

## 2019-05-28 DIAGNOSIS — M4850XA Collapsed vertebra, not elsewhere classified, site unspecified, initial encounter for fracture: Secondary | ICD-10-CM

## 2019-05-28 LAB — CBC WITH DIFFERENTIAL/PLATELET
Abs Immature Granulocytes: 0.05 10*3/uL (ref 0.00–0.07)
Basophils Absolute: 0 10*3/uL (ref 0.0–0.1)
Basophils Relative: 0 %
Eosinophils Absolute: 0 10*3/uL (ref 0.0–0.5)
Eosinophils Relative: 0 %
HCT: 39.6 % (ref 39.0–52.0)
Hemoglobin: 11.9 g/dL — ABNORMAL LOW (ref 13.0–17.0)
Immature Granulocytes: 1 %
Lymphocytes Relative: 5 %
Lymphs Abs: 0.5 10*3/uL — ABNORMAL LOW (ref 0.7–4.0)
MCH: 24.6 pg — ABNORMAL LOW (ref 26.0–34.0)
MCHC: 30.1 g/dL (ref 30.0–36.0)
MCV: 81.8 fL (ref 80.0–100.0)
Monocytes Absolute: 0.6 10*3/uL (ref 0.1–1.0)
Monocytes Relative: 6 %
Neutro Abs: 8.8 10*3/uL — ABNORMAL HIGH (ref 1.7–7.7)
Neutrophils Relative %: 88 %
Platelets: 315 10*3/uL (ref 150–400)
RBC: 4.84 MIL/uL (ref 4.22–5.81)
RDW: 14.8 % (ref 11.5–15.5)
WBC: 9.9 10*3/uL (ref 4.0–10.5)
nRBC: 0 % (ref 0.0–0.2)

## 2019-05-28 LAB — COMPREHENSIVE METABOLIC PANEL
ALT: 40 U/L (ref 0–44)
AST: 23 U/L (ref 15–41)
Albumin: 3.7 g/dL (ref 3.5–5.0)
Alkaline Phosphatase: 74 U/L (ref 38–126)
Anion gap: 13 (ref 5–15)
BUN: 11 mg/dL (ref 8–23)
CO2: 29 mmol/L (ref 22–32)
Calcium: 8.7 mg/dL — ABNORMAL LOW (ref 8.9–10.3)
Chloride: 91 mmol/L — ABNORMAL LOW (ref 98–111)
Creatinine, Ser: 0.56 mg/dL — ABNORMAL LOW (ref 0.61–1.24)
GFR calc Af Amer: >60 mL/min (ref >60)
GFR calc non Af Amer: >60 mL/min (ref >60)
Glucose, Bld: 167 mg/dL — ABNORMAL HIGH (ref 70–99)
Potassium: 4.1 mmol/L (ref 3.5–5.1)
Sodium: 133 mmol/L — ABNORMAL LOW (ref 135–145)
Total Bilirubin: 0.2 mg/dL — ABNORMAL LOW (ref 0.3–1.2)
Total Protein: 7.4 g/dL (ref 6.5–8.1)

## 2019-05-28 LAB — BRAIN NATRIURETIC PEPTIDE: B Natriuretic Peptide: 56.6 pg/mL (ref 0.0–100.0)

## 2019-05-28 LAB — SARS CORONAVIRUS 2 BY RT PCR (HOSPITAL ORDER, PERFORMED IN ~~LOC~~ HOSPITAL LAB): SARS Coronavirus 2: NEGATIVE

## 2019-05-28 MED ORDER — AZITHROMYCIN 250 MG PO TABS
250.0000 mg | ORAL_TABLET | Freq: Every day | ORAL | Status: DC
  Administered 2019-05-29 – 2019-06-02 (×5): 250 mg via ORAL
  Filled 2019-05-28 (×5): qty 1

## 2019-05-28 MED ORDER — IOHEXOL 350 MG/ML SOLN
100.0000 mL | Freq: Once | INTRAVENOUS | Status: AC | PRN
  Administered 2019-05-28: 100 mL via INTRAVENOUS

## 2019-05-28 MED ORDER — METHYLPREDNISOLONE SODIUM SUCC 125 MG IJ SOLR
125.0000 mg | Freq: Once | INTRAMUSCULAR | Status: AC
  Administered 2019-05-28: 125 mg via INTRAVENOUS
  Filled 2019-05-28: qty 2

## 2019-05-28 MED ORDER — ALBUTEROL SULFATE (2.5 MG/3ML) 0.083% IN NEBU: 2.5000 mg | INHALATION_SOLUTION | RESPIRATORY_TRACT | Status: DC | PRN

## 2019-05-28 MED ORDER — AZITHROMYCIN 250 MG PO TABS
500.0000 mg | ORAL_TABLET | Freq: Once | ORAL | Status: AC
  Administered 2019-05-28: 500 mg via ORAL
  Filled 2019-05-28: qty 2

## 2019-05-28 MED ORDER — ALBUTEROL SULFATE HFA 108 (90 BASE) MCG/ACT IN AERS
2.0000 | INHALATION_SPRAY | Freq: Once | RESPIRATORY_TRACT | Status: AC
  Administered 2019-05-28: 2 via RESPIRATORY_TRACT

## 2019-05-28 MED ORDER — ALBUTEROL SULFATE HFA 108 (90 BASE) MCG/ACT IN AERS
4.0000 | INHALATION_SPRAY | Freq: Once | RESPIRATORY_TRACT | Status: AC
  Administered 2019-05-28: 4 via RESPIRATORY_TRACT
  Filled 2019-05-28: qty 6.7

## 2019-05-28 MED ORDER — APIXABAN 5 MG PO TABS
5.0000 mg | ORAL_TABLET | Freq: Two times a day (BID) | ORAL | Status: DC
  Administered 2019-05-28 – 2019-06-03 (×12): 5 mg via ORAL
  Filled 2019-05-28 (×12): qty 1

## 2019-05-28 MED ORDER — IPRATROPIUM-ALBUTEROL 0.5-2.5 (3) MG/3ML IN SOLN: 3.0000 mL | Freq: Four times a day (QID) | RESPIRATORY_TRACT | Status: DC

## 2019-05-28 MED ORDER — SODIUM CHLORIDE (PF) 0.9 % IJ SOLN
INTRAMUSCULAR | Status: AC
  Administered 2019-05-29
  Filled 2019-05-28: qty 50

## 2019-05-28 MED ORDER — METHYLPREDNISOLONE SODIUM SUCC 125 MG IJ SOLR
60.0000 mg | Freq: Three times a day (TID) | INTRAMUSCULAR | Status: DC
  Administered 2019-05-29 – 2019-05-31 (×7): 60 mg via INTRAVENOUS
  Filled 2019-05-28 (×7): qty 2

## 2019-05-28 MED ORDER — LEVALBUTEROL HCL 0.63 MG/3ML IN NEBU
0.6300 mg | INHALATION_SOLUTION | Freq: Four times a day (QID) | RESPIRATORY_TRACT | Status: DC
  Administered 2019-05-29: 0.63 mg via RESPIRATORY_TRACT
  Filled 2019-05-28: qty 3

## 2019-05-28 MED ORDER — SODIUM CHLORIDE 0.9 % IV SOLN
INTRAVENOUS | Status: DC
  Administered 2019-05-28 – 2019-05-29 (×2): via INTRAVENOUS

## 2019-05-28 MED ORDER — IPRATROPIUM BROMIDE 0.02 % IN SOLN
0.5000 mg | Freq: Four times a day (QID) | RESPIRATORY_TRACT | Status: DC
  Administered 2019-05-29: 0.5 mg via RESPIRATORY_TRACT
  Filled 2019-05-28: qty 2.5

## 2019-05-28 NOTE — ED Notes (Signed)
Pt ambulated unassisted roughly 100 ft. Pt 99% O2 on 2 LPM via [place] during walk. Pt had no complaints. After pt placed back in bed pt did have some trouble breathing dropping to 88% saturation with 24 RR. Pt took 1 puff of Pro air rescue inhaler and is in bed resting now.

## 2019-05-28 NOTE — Assessment & Plan Note (Signed)
Chest x ray in office today showed lungs mildly hyperexpanded. Central interstitial prominence bilaterally, a finding that potentially may be indicative of a viral type pneumonitis.  She has progressively worsened since the beginning of the week.  He has prednisone was increased earlier in the week to 20 mg but patient states that this has not helped.  Patient is weak and required wheelchair to be brought into the office. Plan: Patient Instructions  Advised patient to go to ED at [facility] long for further evaluation and covid testing - based on chest x ray Patient agrees - family will transport  Follow up after dischrage

## 2019-05-28 NOTE — ED Triage Notes (Signed)
Coming from [facility], states had CT today-showed a viral PNA-SOB started 2 days ago-was hospitalized weeks ago and tested negative for COVID

## 2019-05-28 NOTE — Progress Notes (Signed)
@Patient  ID: Paul [name], male    DOB: [birth year], 73 y.o.   MRN: [id number]  Chief Complaint  Patient presents with  . Shortness of Breath    Started prednisone taper issued by Dr. Melvyn Novas. Patient stated the SOB has increased and worse when trying to lay down. Now has pain on right side for last two days.    Referring provider: [name], MD  HPI 73 year old male former smoker with COPD Gold stage III, and chronic respiratory failure who is followed by Dr. Melvyn Novas.  Tests/sinificant events: Quit smoking 01/2009  02/04/12 [name]: FEV1 36% FVC 57% FEF 25 75    11% -05/29/16  FEV1 0.83 L (31%) FEV1/FVC 0.40 FEF 25-75 0.30 L (14%) no bronchodilator response p ? rx prior, DLCO corrected 60% (hemoglobin 10.9) - 03/29/2019   continue symb 160 2bid and spiriva smi 2 puffs each am  - 04/15/2019  After extensive coaching inhaler device,  effectiveness =    90% with both hfa and smi but not understanding contingencies for hfa/ neb  - 04/15/2019 placed on daily pred with ceiling 40 and floor of 10 mg daily and empirical rx for GERD - 04/15/2019 flutter valve training   02/06/12 ONO RA: desat <88% 7 episodes per hour, 66 episodes total 01/27/2013 ONO RA desat < 88% 19min of recording , still needs nocturnal oxygen - 03/25/2019 d/c from admit on 2lpm but not using x at hs  - 04/15/2019 02 sats 94% at rest despite flare in copd so rec 2lpm  Hs and prn activity  to keep sats > 90% at all times   As of 05/14/2019   2lpm hs and prn daytime   OV 05/28/19 - acute shortness of breath  Dyspnea: Patient complains of shortness of breath at rest, and with exertion.  Symptoms include chest pain, located right chest or posterior thorax, constant cough, dyspnea on exertion and dyspnea when laying down. Symptoms began 4 days ago, rapidly worsening since that time.  Patient denies edema bilateral, hemoptysis, post nasal drip and wheezing. Associated symptoms include myalgias and productive cough. Patient has not had recent  travel.  Weight has been stable.  Appetite has been unchanged. Symptoms are exacerbated by minimal activity, walking and patient had to be brought by wheelchair. Symptoms are alleviated by rest, oxygen and only temporary improvement. Denies f/c/s, n/v/d, hemoptysis, PND, leg swelling. Patient was advised early this week by Dr. Melvyn Novas to increase prednisone to 20 mg daily, but patient states this has not helped.    Note: Chest x ray in office today showed lungs mildly hyperexpanded. Central interstitial prominence bilaterally, a finding that potentially may be indicative of a viral type pneumonitis.   No Known Allergies  Immunization History  Administered Date(s) Administered  . Influenza Split 09/22/2013, 09/01/2015  . Influenza Whole 09/04/2011, 09/21/2012  . Influenza, High Dose Seasonal PF 09/02/2016, 09/10/2017, 08/31/2018  . Influenza-Unspecified 08/23/2014  . Pneumococcal Conjugate-13 01/11/2014  . Pneumococcal Polysaccharide-23 08/23/2010    Past Medical History:  Diagnosis Date  . A-fib ([place])   . COPD (chronic obstructive pulmonary disease) ([place])   . High cholesterol     Tobacco History: Social History   Tobacco Use  Smoking Status Former Smoker  . Packs/day: 2.00  . Years: 52.00  . Pack years: 104.00  . Types: Cigarettes  . Last attempt to quit: 02/03/2009  . Years since quitting: 10.3  Smokeless Tobacco Never Used  Tobacco Comment   Counseled to remain smoke free   Counseling  given: Not Answered Comment: Counseled to remain smoke free   Outpatient Encounter Medications as of 05/28/2019  Medication Sig  . albuterol (PROAIR HFA) 108 (90 Base) MCG/ACT inhaler Inhale 2 puffs into the lungs every 6 (six) hours as needed for wheezing or shortness of breath.  Marland Kitchen albuterol (PROVENTIL) (2.5 MG/3ML) 0.083% nebulizer solution Take 3 mLs (2.5 mg total) by nebulization every 6 (six) hours as needed for wheezing or shortness of breath.  Marland Kitchen apixaban (ELIQUIS) 5 MG TABS tablet Take  1 tablet (5 mg total) by mouth 2 (two) times daily.  Marland Kitchen atorvastatin (LIPITOR) 40 MG tablet Take 1 tablet (40 mg total) by mouth daily after supper.  . budesonide-formoterol (SYMBICORT) 160-4.5 MCG/ACT inhaler Inhale 2 puffs into the lungs 2 (two) times daily.  . budesonide-formoterol (SYMBICORT) 160-4.5 MCG/ACT inhaler Inhale 2 puffs into the lungs 2 (two) times a day.  . diltiazem (CARDIZEM CD) 360 MG 24 hr capsule Take 1 capsule (360 mg total) by mouth daily.  Marland Kitchen doxylamine, Sleep, (UNISOM) 25 MG tablet Take 25 mg by mouth at bedtime as needed for sleep.  . famotidine (PEPCID) 20 MG tablet One hour before  bedtime (Patient taking differently: Take 20 mg by mouth daily after supper. )  . furosemide (LASIX) 20 MG tablet Take 1 tablet (20 mg total) by mouth daily.  Marland Kitchen loratadine (CLARITIN) 10 MG tablet Take 10 mg by mouth daily.  Marland Kitchen LORazepam (ATIVAN) 0.5 MG tablet Take 0.5 mg by mouth daily as needed for anxiety.   . metoprolol tartrate (LOPRESSOR) 25 MG tablet Take 1 tablet (25 mg total) by mouth 2 (two) times daily.  . OXYGEN Inhale 2 L into the lungs continuous.   . pantoprazole (PROTONIX) 40 MG tablet Take 1 tablet (40 mg total) by mouth daily. Take 30-60 min before first meal of the day  . Phenylephrine-APAP-guaiFENesin (MUCINEX FAST-MAX) 10-650-400 MG/20ML LIQD Take 15 mLs by mouth as needed (to loosen phlegm).   . predniSONE (DELTASONE) 20 MG tablet Take 2 tablets (40 mg total) by mouth daily with breakfast for 3 days, THEN 1.5 tablets (30 mg total) daily with breakfast for 7 days, THEN 1 tablet (20 mg total) daily with breakfast for 7 days, THEN 0.5 tablets (10 mg total) daily with breakfast for 7 days.  Marland Kitchen Respiratory Therapy Supplies (FLUTTER) DEVI Use as directed (Patient taking differently: as directed. )  . sertraline (ZOLOFT) 50 MG tablet Take 50 mg by mouth daily.  . Tiotropium Bromide Monohydrate (SPIRIVA RESPIMAT) 2.5 MCG/ACT AERS Inhale 2 puffs into the lungs daily at 3 pm. (Patient  taking differently: Inhale 2 puffs into the lungs daily. )  . Tiotropium Bromide Monohydrate (SPIRIVA RESPIMAT) 2.5 MCG/ACT AERS Inhale 2 puffs into the lungs daily.  Marland Kitchen azithromycin (ZITHROMAX) 250 MG tablet Take 2 tablets (500 mg) on day 1, then take 1 tablet (250 mg) on days 2-5 (Patient not taking: Reported on 05/28/2019)   No facility-administered encounter medications on file as of 05/28/2019.      Review of Systems  Review of Systems  Constitutional: Positive for chills. Negative for fever.  HENT: Negative.   Respiratory: Positive for cough and shortness of breath. Negative for wheezing.   Cardiovascular: Negative.  Negative for chest pain, palpitations and leg swelling.  Gastrointestinal: Negative.   Allergic/Immunologic: Negative.   Neurological: Negative.   Psychiatric/Behavioral: Negative.        Physical Exam  BP (!) 144/78 (BP Location: Left Arm, Patient Position: Sitting, Cuff Size: Normal)  Pulse (!) 102   Temp 98.2 F (36.8 C)   Ht 5\' 6"  (1.676 m)   Wt 134 lb 3.2 oz (60.9 kg)   SpO2 98% Comment: 2L of O2  BMI 21.66 kg/m   Wt Readings from Last 5 Encounters:  05/28/19 134 lb 3.2 oz (60.9 kg)  05/14/19 133 lb 12.8 oz (60.7 kg)  05/03/19 130 lb 8.2 oz (59.2 kg)  04/15/19 138 lb (62.6 kg)  04/13/19 137 lb (62.1 kg)     Physical Exam Vitals signs and nursing note reviewed.  Constitutional:      General: He is not in acute distress.    Appearance: He is well-developed. He is ill-appearing.  Cardiovascular:     Rate and Rhythm: Normal rate and regular rhythm.  Pulmonary:     Effort: Pulmonary effort is normal.     Breath sounds: Examination of the right-lower field reveals rhonchi. Examination of the left-lower field reveals rhonchi. Rhonchi present. No wheezing.  Musculoskeletal:     Right lower leg: No edema.  Skin:    General: Skin is warm and dry.  Neurological:     Mental Status: He is alert and oriented to person, place, and time.     Imaging:  Dg Chest 2 View  Result Date: 05/28/2019 CLINICAL DATA:  Shortness of breath EXAM: CHEST - 2 VIEW COMPARISON:  May 03, 2019 and March 10, 2019 FINDINGS: Lungs are mildly hyperexpanded. There is interstitial thickening bilaterally, a finding not present on recent prior study. There is no consolidation or volume loss. Heart size and pulmonary vascularity are normal. No adenopathy. There is aortic atherosclerosis. Slight anterior wedging in several thoracic vertebral bodies is stable. IMPRESSION: Lungs mildly hyperexpanded. Central interstitial prominence bilaterally, a finding that potentially may be indicative of a viral type pneumonitis. No consolidation or volume loss evident. No adenopathy. Heart size normal. Aortic Atherosclerosis (ICD10-I70.0). Electronically Signed   By: Lowella Grip III M.D.   On: 05/28/2019 16:11   Dg Chest Port 1 View  Result Date: 05/03/2019 CLINICAL DATA:  COPD, shortness of breath EXAM: PORTABLE CHEST 1 VIEW COMPARISON:  04/14/2019 FINDINGS: There is hyperinflation of the lungs compatible with COPD. Heart and mediastinal contours are within normal limits. No focal opacities or effusions. No acute bony abnormality. IMPRESSION: COPD.  No active disease. Electronically Signed   By: Rolm Baptise M.D.   On: 05/03/2019 13:48     Assessment & Plan:   Viral pneumonitis Chest x ray in office today showed lungs mildly hyperexpanded. Central interstitial prominence bilaterally, a finding that potentially may be indicative of a viral type pneumonitis.  She has progressively worsened since the beginning of the week.  He has prednisone was increased earlier in the week to 20 mg but patient states that this has not helped.  Patient is weak and required wheelchair to be brought into the office. Plan: Patient Instructions  Advised patient to go to ED at [facility] long for further evaluation and covid testing - based on chest x ray Patient agrees - family will transport  Follow up  after [place], NP 05/28/2019

## 2019-05-28 NOTE — ED Provider Notes (Signed)
Patient care assumed at 9pm. Patient with history of COPD here for evaluation of shortness of breath despite increased steroids. CTA negative for pulmonary embolism but is limited due to bolus timing. CT is positive for TA compression fracture. Discussed with patient findings of studies. He does have significant ongoing dyspnea particularly on exertion with hypoxia on 2 L nasal cannula when he exerts himself. Medicine consulted for observation COPD exacerbation.   Quintella Reichert, MD 05/28/19 [id number]

## 2019-05-28 NOTE — H&P (Signed)
History and Physical    Paul [name] RXV:[id number] DOB: February 12, 1946 DOA: 05/28/2019  PCP: [name], MD Patient coming from: Pulmonology office  Chief Complaint: Shortness of breath  HPI: Paul [name] is a 73 y.o. 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 fib, hyperlipidemia presenting to the hospital from her pulmonologist office for the management of viral pneumonitis.  She is currently being treated with prednisone and dose was increased to 20 mg earlier this week but has not helped. Patient reports 2-day history of worsening shortness of breath and cough productive of thick sputum.  States he has COPD and is chronically on prednisone.  The dose of his prednisone was recently increased but he continues to be symptomatic.  He uses 2 L home oxygen at night and throughout most of the day.  No recent change in oxygen requirement.  Denies any fevers or chills.  Denies chest pain.  Reports having back pain in the thoracic region for the past 2 days.  No recent falls.  ED Course: Afebrile.  Slightly tachypneic.  Blood pressure 179/75 on arrival.  SPO2 98% on 2 L supplemental oxygen.  No leukocytosis.  BNP normal.  COVID-19 rapid test negative.  Chest x-ray showing mildly hyperexpanded lungs and central interstitial prominence bilaterally indicative of a viral type pneumonitis.  CT angiogram negative for PE.  Showing moderate emphysematous changes bilaterally. Patient received Solu-Medrol 125 mg and albuterol MDI treatments in the ED. Admission requested for acute COPD exacerbation.  Review of Systems:  All systems reviewed and apart from history of presenting illness, are negative.  Past Medical History:  Diagnosis Date   A-fib ([place])    COPD (chronic obstructive pulmonary disease) (HCC)    High cholesterol     Past Surgical History:  Procedure Laterality Date   CATARACT EXTRACTION Right    SKIN SURGERY     shoulders  and chest     reports that he quit smoking about 10 years ago. His smoking use included cigarettes. He has a 104.00 pack-year smoking history. He has never used smokeless tobacco. He reports current alcohol use. He reports that he does not use drugs.  No Known Allergies  Family History  Problem Relation Age of Onset   Heart disease Brother    Heart disease Mother    Heart disease Father    Cancer Paternal Uncle     Prior to Admission medications   Medication Sig Start Date End Date Taking? Authorizing Provider  albuterol (PROAIR HFA) 108 (90 Base) MCG/ACT inhaler Inhale 2 puffs into the lungs every 6 (six) hours as needed for wheezing or shortness of breath.    [provider]  albuterol (PROVENTIL) (2.5 MG/3ML) 0.083% nebulizer solution Take 3 mLs (2.5 mg total) by nebulization every 6 (six) hours as needed for wheezing or shortness of breath. 02/28/19   [name], PA-C  apixaban (ELIQUIS) 5 MG TABS tablet Take 1 tablet (5 mg total) by mouth 2 (two) times daily. 04/06/19   Jerline Pain, MD  atorvastatin (LIPITOR) 40 MG tablet Take 1 tablet (40 mg total) by mouth daily after supper. 04/06/19   Jerline Pain, MD  azithromycin (ZITHROMAX) 250 MG tablet Take 2 tablets (500 mg) on day 1, then take 1 tablet (250 mg) on days 2-5 Patient not taking: Reported on 05/28/2019 05/14/19   Fenton Foy, NP  budesonide-formoterol [facility]) 160-4.5 MCG/ACT inhaler Inhale 2 puffs into the lungs  2 (two) times daily. 04/15/19   Tanda Rockers, MD  budesonide-formoterol (SYMBICORT) 160-4.5 MCG/ACT inhaler Inhale 2 puffs into the lungs 2 (two) times a day. 05/14/19   Fenton Foy, NP  diltiazem (CARDIZEM CD) 360 MG 24 hr capsule Take 1 capsule (360 mg total) by mouth daily. 04/06/19   Jerline Pain, MD  doxylamine, Sleep, (UNISOM) 25 MG tablet Take 25 mg by mouth at bedtime as needed for sleep.    [provider]  famotidine (PEPCID) 20 MG tablet One hour before   bedtime Patient taking differently: Take 20 mg by mouth daily after supper.  04/15/19   Tanda Rockers, MD  furosemide (LASIX) 20 MG tablet Take 1 tablet (20 mg total) by mouth daily. 04/06/19   Jerline Pain, MD  loratadine (CLARITIN) 10 MG tablet Take 10 mg by mouth daily.    [provider]  LORazepam (ATIVAN) 0.5 MG tablet Take 0.5 mg by mouth daily as needed for anxiety.  04/07/19   [provider]  metoprolol tartrate (LOPRESSOR) 25 MG tablet Take 1 tablet (25 mg total) by mouth 2 (two) times daily. 04/12/19   Jerline Pain, MD  OXYGEN Inhale 2 L into the lungs continuous.     [provider]  pantoprazole (PROTONIX) 40 MG tablet Take 1 tablet (40 mg total) by mouth daily. Take 30-60 min before first meal of the day 04/15/19   Tanda Rockers, MD  Phenylephrine-APAP-guaiFENesin [facility] FAST-MAX) [phone] MG/20ML LIQD Take 15 mLs by mouth as needed (to loosen phlegm).     [provider]  predniSONE (DELTASONE) 20 MG tablet Take 2 tablets (40 mg total) by mouth daily with breakfast for 3 days, THEN 1.5 tablets (30 mg total) daily with breakfast for 7 days, THEN 1 tablet (20 mg total) daily with breakfast for 7 days, THEN 0.5 tablets (10 mg total) daily with breakfast for 7 days. 05/07/19 05/31/19  Isabelle Course, MD  Respiratory Therapy Supplies (FLUTTER) DEVI Use as directed Patient taking differently: as directed.  04/15/19   Tanda Rockers, MD  sertraline (ZOLOFT) 50 MG tablet Take 50 mg by mouth daily.    [provider]  Tiotropium Bromide Monohydrate (SPIRIVA RESPIMAT) 2.5 MCG/ACT AERS Inhale 2 puffs into the lungs daily at 3 pm. Patient taking differently: Inhale 2 puffs into the lungs daily.  04/15/19   Tanda Rockers, MD  Tiotropium Bromide Monohydrate (SPIRIVA RESPIMAT) 2.5 MCG/ACT AERS Inhale 2 puffs into the lungs daily. 05/14/19   Fenton Foy, NP    Physical Exam: Vitals:   05/29/19 0008 05/29/19 0023 05/29/19 0232 05/29/19 0518   BP: (!) 150/70 (!) 154/60  140/83  Pulse: (!) 101 (!) 102  93  Resp: 20 15  17   Temp: 98.3 F (36.8 C) 98.7 F (37.1 C)  (!) 97.5 F (36.4 C)  TempSrc: Oral Oral    SpO2: 92% 99% 99% 98%    Physical Exam  Constitutional: He is oriented to person, place, and time. He appears well-developed and well-nourished. No distress.  HENT:  Head: Normocephalic.  Mouth/Throat: Oropharynx is clear and moist.  Eyes: Right eye exhibits no discharge. Left eye exhibits no discharge.  Neck: Neck supple.  Cardiovascular: Regular rhythm and intact distal pulses.  Slightly tachycardic  Pulmonary/Chest: He is in respiratory distress. He has no wheezes. He has no rales.  Tachypneic and slightly increased work of breathing Satting well on 2 L supplemental oxygen (same as home requirement)  Abdominal: Soft. Bowel sounds are normal. He exhibits no distension. There is no abdominal tenderness. There is no guarding.  Musculoskeletal:        General: No edema.  Neurological: He is alert and oriented to person, place, and time.  Strength 5 out of 5 in bilateral upper and lower extremities.  Sensation to light touch intact throughout.  Skin: Skin is warm and dry. He is not diaphoretic.     Labs on Admission: I have personally reviewed following labs and imaging studies  CBC: Recent Labs  Lab 05/28/19 1724  WBC 9.9  NEUTROABS 8.8*  HGB 11.9*  HCT 39.6  MCV 81.8  PLT 244   Basic Metabolic Panel: Recent Labs  Lab 05/28/19 1724  NA 133*  K 4.1  CL 91*  CO2 29  GLUCOSE 167*  BUN 11  CREATININE 0.56*  CALCIUM 8.7*   GFR: Estimated Creatinine Clearance: 69.8 mL/min (A) (by C-G formula based on SCr of 0.56 mg/dL (L)). Liver Function Tests: Recent Labs  Lab 05/28/19 1724  AST 23  ALT 40  ALKPHOS 74  BILITOT 0.2*  PROT 7.4  ALBUMIN 3.7   No results for input(s): LIPASE, AMYLASE in the last 168 hours. No results for input(s): AMMONIA in the last 168 hours. Coagulation Profile: No  results for input(s): INR, PROTIME in the last 168 hours. Cardiac Enzymes: No results for input(s): CKTOTAL, CKMB, CKMBINDEX, TROPONINI in the last 168 hours. BNP (last 3 results) No results for input(s): PROBNP in the last 8760 hours. HbA1C: No results for input(s): HGBA1C in the last 72 hours. CBG: No results for input(s): GLUCAP in the last 168 hours. Lipid Profile: No results for input(s): CHOL, HDL, LDLCALC, TRIG, CHOLHDL, LDLDIRECT in the last 72 hours. Thyroid Function Tests: No results for input(s): TSH, T4TOTAL, FREET4, T3FREE, THYROIDAB in the last 72 hours. Anemia Panel: No results for input(s): VITAMINB12, FOLATE, FERRITIN, TIBC, IRON, RETICCTPCT in the last 72 hours. Urine analysis:    Component Value Date/Time   COLORURINE YELLOW 03/03/2019 1325   APPEARANCEUR CLEAR 03/03/2019 1325   LABSPEC <1.005 (L) 03/03/2019 1325   PHURINE 6.5 03/03/2019 1325   GLUCOSEU NEGATIVE 03/03/2019 1325   HGBUR NEGATIVE 03/03/2019 1325   BILIRUBINUR NEGATIVE 03/03/2019 1325   KETONESUR NEGATIVE 03/03/2019 1325   PROTEINUR NEGATIVE 03/03/2019 1325   NITRITE NEGATIVE 03/03/2019 1325   LEUKOCYTESUR NEGATIVE 03/03/2019 1325    Radiological Exams on Admission: Dg Chest 2 View  Result Date: 05/28/2019 CLINICAL DATA:  Shortness of breath EXAM: CHEST - 2 VIEW COMPARISON:  May 03, 2019 and March 10, 2019 FINDINGS: Lungs are mildly hyperexpanded. There is interstitial thickening bilaterally, a finding not present on recent prior study. There is no consolidation or volume loss. Heart size and pulmonary vascularity are normal. No adenopathy. There is aortic atherosclerosis. Slight anterior wedging in several thoracic vertebral bodies is stable. IMPRESSION: Lungs mildly hyperexpanded. Central interstitial prominence bilaterally, a finding that potentially may be indicative of a viral type pneumonitis. No consolidation or volume loss evident. No adenopathy. Heart size normal. Aortic Atherosclerosis  (ICD10-I70.0). Electronically Signed   By: Lowella Grip III M.D.   On: 05/28/2019 16:11   Ct Angio Chest Pe W/cm &/or Wo Cm  Result Date: 05/28/2019 CLINICAL DATA:  Pneumonia.  Concern for PE. EXAM: CT ANGIOGRAPHY CHEST WITH CONTRAST TECHNIQUE: Multidetector CT imaging of the chest was performed using the standard protocol during bolus administration of intravenous contrast. Multiplanar CT image reconstructions and MIPs were obtained to evaluate  the vascular anatomy. CONTRAST:  144mL OMNIPAQUE IOHEXOL 350 MG/ML SOLN COMPARISON:  CT dated 03/10/2019 FINDINGS: Cardiovascular: Evaluation for pulmonary emboli is limited by motion artifact. Given this limitation, there is no definite PE. The heart size is normal. There are some mild aortic calcifications and coronary artery calcifications. Mediastinum/Nodes: No enlarged mediastinal, hilar, or axillary lymph nodes. Thyroid gland, trachea, and esophagus demonstrate no significant findings. Lungs/Pleura: There is calcified pleuroparenchymal scarring at the lung apices. There are calcified pulmonary nodules bilaterally. There are moderate emphysematous changes bilaterally. There is debris within the trachea. There is a stable pulmonary nodule in the left upper lobe measuring approximately 5 mm. There is scarring at the right lung base which appears stable. There is no pneumothorax. No significant pleural effusion. Upper Abdomen: No acute abnormality. 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 Musculoskeletal: There is a new compression deformity of T8 vertebral body, not visualized on prior CT dated 03/18 2020. there is approximately 25% height loss anteriorly. Review of the MIP images confirms the above findings. IMPRESSION: 1. Examination limited by motion artifact. Given this limitation, no definite PE identified on today's exam. 2. Stable bilateral pulmonary nodules. Continued follow-up is recommended as  detailed on prior CT. 3. Moderate emphysematous changes bilaterally. Debris is noted within the trachea. 4. New mild age-indeterminate compression fracture of the T8 vertebral body. This is new since 03/10/2019 CT. Correlation with point tenderness is recommended. Aortic Atherosclerosis (ICD10-I70.0) and Emphysema (ICD10-J43.9). Electronically Signed   By: Constance Holster M.D.   On: 05/28/2019 21:17    EKG: Independently reviewed.  Sinus rhythm, baseline wander in several leads.  Assessment/Plan Principal Problem:   COPD with acute exacerbation (HCC) Active Problems:   Paroxysmal atrial fibrillation (HCC)   Vertebral compression fracture (HCC)   Chronic anemia   Pulmonary nodules   Acute COPD exacerbation Currently not wheezing but received albuterol treatments in the ED prior to my examination.  Currently on 2 L supplemental oxygen (same as home requirement).  Oxygen saturation dropped to 88% with ambulation.  Slightly tachypneic on arrival.  Afebrile and no leukocytosis.  BNP normal.  COVID-19 rapid test negative.  CT angiogram negative for PE and showing moderate emphysematous changes bilaterally.  Received Solu-Medrol 125 mg and albuterol MDI treatments in the ED. -Continue Solu-Medrol 60 mg every 8 hours -Levalbuterol-ipratropium every 6 hours -Albuterol nebulizer every 2 hours as needed -Azithromycin -Continuous pulse ox  Elevated blood pressure No documented history of hypertension.  Blood pressure elevated on arrival, currently improved. -Continue to monitor  Vertebral compression fracture Imaging showing new mild age-indeterminate compression fracture of the T8 vertebral body. This is new since 03/10/2019 CT. Patient reports 2-day history of back pain.  No recent falls. -Norco PRN, Tylenol PRN  Chronic normocytic anemia -Stable.  Hemoglobin 11.9, at baseline.  Pulmonary nodules CT showing stable bilateral pulmonary nodules.  -Patient will need continued outpatient  follow-up.  Paroxysmal A. Fib -Currently rate controlled.  Continue home Eliquis.  Continue metoprolol and Cardizem.  Depression -Continue Zoloft  GERD -Continue PPI, H2 blocker  Anxiety -Continue home Ativan PRN  Hyperlipidemia -Continue Lipitor  DVT prophylaxis: Eliquis Code Status: Full code Family Communication: No family available. Disposition Plan: Anticipate discharge after clinical improvement. Consults called: None Admission status: It is my clinical opinion that referral for OBSERVATION is reasonable and necessary in this patient based on the above information provided. The aforementioned taken together are felt to place the patient at high  risk for further clinical deterioration. However it is anticipated that the patient may be medically stable for discharge from the hospital within 24 to 48 hours.  The medical decision making on this patient was of high complexity and the patient is at high risk for clinical deterioration, therefore this is a level 3 visit.  Shela Leff MD [facility] Pager [phone]  If 7PM-7AM, please contact night-coverage [email or web address] Password [email or web address]  05/29/2019, 7:14 AM

## 2019-05-28 NOTE — Patient Instructions (Signed)
Advised patient to go to ED at [facility] long for further evaluation and covid testing - based on chest x ray Patient agrees - family will transport  Follow up after dischrage

## 2019-05-28 NOTE — ED Notes (Signed)
Bed: MB34 Expected date:  Expected time:  Means of arrival:  Comments: Negative pressure

## 2019-05-29 ENCOUNTER — Encounter (HOSPITAL_COMMUNITY): Payer: Self-pay

## 2019-05-29 ENCOUNTER — Other Ambulatory Visit: Payer: Self-pay

## 2019-05-29 DIAGNOSIS — Z7951 Long term (current) use of inhaled steroids: Secondary | ICD-10-CM | POA: Diagnosis not present

## 2019-05-29 DIAGNOSIS — E785 Hyperlipidemia, unspecified: Secondary | ICD-10-CM | POA: Diagnosis present

## 2019-05-29 DIAGNOSIS — Z7952 Long term (current) use of systemic steroids: Secondary | ICD-10-CM | POA: Diagnosis not present

## 2019-05-29 DIAGNOSIS — J129 Viral pneumonia, unspecified: Secondary | ICD-10-CM | POA: Diagnosis present

## 2019-05-29 DIAGNOSIS — J440 Chronic obstructive pulmonary disease with acute lower respiratory infection: Secondary | ICD-10-CM | POA: Diagnosis present

## 2019-05-29 DIAGNOSIS — D649 Anemia, unspecified: Secondary | ICD-10-CM

## 2019-05-29 DIAGNOSIS — R918 Other nonspecific abnormal finding of lung field: Secondary | ICD-10-CM | POA: Diagnosis not present

## 2019-05-29 DIAGNOSIS — Z87891 Personal history of nicotine dependence: Secondary | ICD-10-CM | POA: Diagnosis not present

## 2019-05-29 DIAGNOSIS — Z20828 Contact with and (suspected) exposure to other viral communicable diseases: Secondary | ICD-10-CM | POA: Diagnosis present

## 2019-05-29 DIAGNOSIS — M4854XA Collapsed vertebra, not elsewhere classified, thoracic region, initial encounter for fracture: Secondary | ICD-10-CM | POA: Diagnosis present

## 2019-05-29 DIAGNOSIS — Z9981 Dependence on supplemental oxygen: Secondary | ICD-10-CM | POA: Diagnosis not present

## 2019-05-29 DIAGNOSIS — M4850XA Collapsed vertebra, not elsewhere classified, site unspecified, initial encounter for fracture: Secondary | ICD-10-CM

## 2019-05-29 DIAGNOSIS — J441 Chronic obstructive pulmonary disease with (acute) exacerbation: Secondary | ICD-10-CM | POA: Diagnosis not present

## 2019-05-29 DIAGNOSIS — F419 Anxiety disorder, unspecified: Secondary | ICD-10-CM | POA: Diagnosis present

## 2019-05-29 DIAGNOSIS — Z79899 Other long term (current) drug therapy: Secondary | ICD-10-CM | POA: Diagnosis not present

## 2019-05-29 DIAGNOSIS — R0602 Shortness of breath: Secondary | ICD-10-CM | POA: Diagnosis not present

## 2019-05-29 DIAGNOSIS — I480 Paroxysmal atrial fibrillation: Secondary | ICD-10-CM | POA: Diagnosis not present

## 2019-05-29 DIAGNOSIS — K219 Gastro-esophageal reflux disease without esophagitis: Secondary | ICD-10-CM | POA: Diagnosis present

## 2019-05-29 DIAGNOSIS — J9611 Chronic respiratory failure with hypoxia: Secondary | ICD-10-CM | POA: Diagnosis present

## 2019-05-29 DIAGNOSIS — F329 Major depressive disorder, single episode, unspecified: Secondary | ICD-10-CM | POA: Diagnosis present

## 2019-05-29 DIAGNOSIS — Z7901 Long term (current) use of anticoagulants: Secondary | ICD-10-CM | POA: Diagnosis not present

## 2019-05-29 HISTORY — DX: Collapsed vertebra, not elsewhere classified, site unspecified, initial encounter for fracture: M48.50XA

## 2019-05-29 MED ORDER — TIOTROPIUM BROMIDE MONOHYDRATE 18 MCG IN CAPS
18.0000 ug | ORAL_CAPSULE | Freq: Every day | RESPIRATORY_TRACT | Status: DC
  Administered 2019-05-30 – 2019-06-03 (×5): 18 ug via RESPIRATORY_TRACT
  Filled 2019-05-29: qty 5

## 2019-05-29 MED ORDER — METOPROLOL TARTRATE 25 MG PO TABS
25.0000 mg | ORAL_TABLET | Freq: Two times a day (BID) | ORAL | Status: DC
  Administered 2019-05-29 – 2019-06-03 (×11): 25 mg via ORAL
  Filled 2019-05-29 (×11): qty 1

## 2019-05-29 MED ORDER — ATORVASTATIN CALCIUM 40 MG PO TABS
40.0000 mg | ORAL_TABLET | Freq: Every day | ORAL | Status: DC
  Administered 2019-05-29 – 2019-06-02 (×5): 40 mg via ORAL
  Filled 2019-05-29 (×5): qty 1

## 2019-05-29 MED ORDER — LORATADINE 10 MG PO TABS
10.0000 mg | ORAL_TABLET | Freq: Every day | ORAL | Status: DC
  Administered 2019-05-29 – 2019-06-03 (×6): 10 mg via ORAL
  Filled 2019-05-29 (×6): qty 1

## 2019-05-29 MED ORDER — ACETAMINOPHEN 325 MG PO TABS: 650.0000 mg | ORAL_TABLET | Freq: Four times a day (QID) | ORAL | Status: DC | PRN

## 2019-05-29 MED ORDER — IPRATROPIUM BROMIDE 0.02 % IN SOLN
0.5000 mg | Freq: Three times a day (TID) | RESPIRATORY_TRACT | Status: DC
  Administered 2019-05-29 – 2019-05-31 (×8): 0.5 mg via RESPIRATORY_TRACT
  Filled 2019-05-29 (×8): qty 2.5

## 2019-05-29 MED ORDER — LEVALBUTEROL HCL 0.63 MG/3ML IN NEBU
0.6300 mg | INHALATION_SOLUTION | Freq: Three times a day (TID) | RESPIRATORY_TRACT | Status: DC
  Administered 2019-05-29 – 2019-06-03 (×16): 0.63 mg via RESPIRATORY_TRACT
  Filled 2019-05-29 (×16): qty 3

## 2019-05-29 MED ORDER — LORAZEPAM 0.5 MG PO TABS: 0.5000 mg | ORAL_TABLET | Freq: Every day | ORAL | Status: DC | PRN

## 2019-05-29 MED ORDER — DILTIAZEM HCL ER COATED BEADS 180 MG PO CP24
360.0000 mg | ORAL_CAPSULE | Freq: Every day | ORAL | Status: DC
  Administered 2019-05-29 – 2019-06-03 (×6): 360 mg via ORAL
  Filled 2019-05-29 (×6): qty 2

## 2019-05-29 MED ORDER — MOMETASONE FURO-FORMOTEROL FUM 100-5 MCG/ACT IN AERO
2.0000 | INHALATION_SPRAY | Freq: Two times a day (BID) | RESPIRATORY_TRACT | Status: DC
  Administered 2019-05-30 – 2019-06-03 (×9): 2 via RESPIRATORY_TRACT
  Filled 2019-05-29: qty 8.8

## 2019-05-29 MED ORDER — FAMOTIDINE 20 MG PO TABS
20.0000 mg | ORAL_TABLET | Freq: Every day | ORAL | Status: DC
  Administered 2019-05-29 – 2019-06-02 (×5): 20 mg via ORAL
  Filled 2019-05-29 (×5): qty 1

## 2019-05-29 MED ORDER — PANTOPRAZOLE SODIUM 40 MG PO TBEC
40.0000 mg | DELAYED_RELEASE_TABLET | Freq: Every day | ORAL | Status: DC
  Administered 2019-05-29 – 2019-06-03 (×6): 40 mg via ORAL
  Filled 2019-05-29 (×6): qty 1

## 2019-05-29 MED ORDER — HYDROCODONE-ACETAMINOPHEN 5-325 MG PO TABS: 1.0000 | ORAL_TABLET | Freq: Four times a day (QID) | ORAL | Status: DC | PRN

## 2019-05-29 MED ORDER — SERTRALINE HCL 50 MG PO TABS
50.0000 mg | ORAL_TABLET | Freq: Every day | ORAL | Status: DC
  Administered 2019-05-29 – 2019-06-03 (×6): 50 mg via ORAL
  Filled 2019-05-29 (×6): qty 1

## 2019-05-29 NOTE — ED Notes (Signed)
ED TO INPATIENT HANDOFF REPORT  ED Nurse Name and Phone #:  Laurelyn Sickle    S Name/Age/Gender Paul [name] 73 y.o. male Room/Bed: WA18/WA18  Code Status   Code Status: Full Code  Home/SNF/Other Home Patient oriented to: self, place, time and situation Is this baseline? Yes   Triage Complete: Triage complete  Chief Complaint SOB, Back pain, Possible Covid  Triage Note Coming from [facility], states had CT today-showed a viral PNA-SOB started 2 days ago-was hospitalized weeks ago and tested negative for COVID   Allergies No Known Allergies  Level of Care/Admitting Diagnosis ED Disposition    ED Disposition Condition [facility] Area: [place] [[id number]]  Level of Care: Telemetry [5]  Admit to tele based on following criteria: Other see comments  Comments: Monitor hemodynamics  Covid Evaluation: Confirmed COVID Negative  Diagnosis: COPD with acute exacerbation [facility]) [[id number]]  Admitting Physician: Shela Leff [[id number]]  Attending Physician: Shela Leff [[id number]]  PT Class (Do Not Modify): Observation [104]  PT Acc Code (Do Not Modify): Observation [10022]       B Medical/Surgery History Past Medical History:  Diagnosis Date  . A-fib ([facility])   . COPD (chronic obstructive pulmonary disease) ([place])   . High cholesterol    Past Surgical History:  Procedure Laterality Date  . CATARACT EXTRACTION Right   . SKIN SURGERY     shoulders and chest     A IV Location/Drains/Wounds Patient Lines/Drains/Airways Status   Active Line/Drains/Airways    Name:   Placement date:   Placement time:   Site:   Days:   Peripheral IV 05/28/19 Right Forearm   05/28/19    1749    Forearm   1          Intake/Output Last 24 hours No intake or output data in the 24 hours ending 05/29/19 0002  Labs/Imaging Results for orders placed or performed during the hospital encounter of 05/28/19 (from the past 48 hour(s))  SARS Coronavirus 2  (CEPHEID- Performed in [place] hospital lab), Hosp Order     Status: None   Collection Time: 05/28/19  5:20 PM  Result Value Ref Range   SARS Coronavirus 2 NEGATIVE NEGATIVE    Comment: (NOTE) If result is NEGATIVE SARS-CoV-2 target nucleic acids are NOT DETECTED. The SARS-CoV-2 RNA is generally detectable in upper and lower  respiratory specimens during the acute phase of infection. The lowest  concentration of SARS-CoV-2 viral copies this assay can detect is 250  copies / mL. A negative result does not preclude SARS-CoV-2 infection  and should not be used as the sole basis for treatment or other  patient management decisions.  A negative result may occur with  improper specimen collection / handling, submission of specimen other  than nasopharyngeal swab, presence of viral mutation(s) within the  areas targeted by this assay, and inadequate number of viral copies  (<250 copies / mL). A negative result must be combined with clinical  observations, patient history, and epidemiological information. If result is POSITIVE SARS-CoV-2 target nucleic acids are DETECTED. The SARS-CoV-2 RNA is generally detectable in upper and lower  respiratory specimens dur ing the acute phase of infection.  Positive  results are indicative of active infection with SARS-CoV-2.  Clinical  correlation with patient history and other diagnostic information is  necessary to determine patient infection status.  Positive results do  not rule out bacterial infection or co-infection with other viruses. If result is PRESUMPTIVE  POSTIVE SARS-CoV-2 nucleic acids MAY BE PRESENT.   A presumptive positive result was obtained on the submitted specimen  and confirmed on repeat testing.  While 2019 novel coronavirus  (SARS-CoV-2) nucleic acids may be present in the submitted sample  additional confirmatory testing may be necessary for epidemiological  and / or clinical management purposes  to differentiate between   SARS-CoV-2 and other Sarbecovirus currently known to infect humans.  If clinically indicated additional testing with an alternate test  methodology [phone]) is advised. The SARS-CoV-2 RNA is generally  detectable in upper and lower respiratory sp ecimens during the acute  phase of infection. The expected result is Negative. Fact Sheet for Patients:  StrictlyIdeas.no Fact Sheet for Healthcare Providers: BankingDealers.co.za This test is not yet approved or cleared by the [place] FDA and has been authorized for detection and/or diagnosis of SARS-CoV-2 by FDA under an Emergency Use Authorization (EUA).  This EUA will remain in effect (meaning this test can be used) for the duration of the COVID-19 declaration under Section 564(b)(1) of the Act, 21 U.S.C. section 360bbb-3(b)(1), unless the authorization is terminated or revoked sooner. Performed at [facility], [place] [address] [place], [place] [address]   CBC with Differential/Platelet     Status: Abnormal   Collection Time: 05/28/19  5:24 PM  Result Value Ref Range   WBC 9.9 4.0 - 10.5 K/uL   RBC 4.84 4.22 - 5.81 MIL/uL   Hemoglobin 11.9 (L) 13.0 - 17.0 g/dL   HCT 39.6 39.0 - 52.0 %   MCV 81.8 80.0 - 100.0 fL   MCH 24.6 (L) 26.0 - 34.0 pg   MCHC 30.1 30.0 - 36.0 g/dL   RDW 14.8 11.5 - 15.5 %   Platelets 315 150 - 400 K/uL   nRBC 0.0 0.0 - 0.2 %   Neutrophils Relative % 88 %   Neutro Abs 8.8 (H) 1.7 - 7.7 K/uL   Lymphocytes Relative 5 %   Lymphs Abs 0.5 (L) 0.7 - 4.0 K/uL   Monocytes Relative 6 %   Monocytes Absolute 0.6 0.1 - 1.0 K/uL   Eosinophils Relative 0 %   Eosinophils Absolute 0.0 0.0 - 0.5 K/uL   Basophils Relative 0 %   Basophils Absolute 0.0 0.0 - 0.1 K/uL   Immature Granulocytes 1 %   Abs Immature Granulocytes 0.05 0.00 - 0.07 K/uL    Comment: Performed at [facility], [place] [address], [place], [place] [address]   Comprehensive metabolic panel     Status: Abnormal   Collection Time: 05/28/19  5:24 PM  Result Value Ref Range   Sodium 133 (L) 135 - 145 mmol/L   Potassium 4.1 3.5 - 5.1 mmol/L   Chloride 91 (L) 98 - 111 mmol/L   CO2 29 22 - 32 mmol/L   Glucose, Bld 167 (H) 70 - 99 mg/dL   BUN 11 8 - 23 mg/dL   Creatinine, Ser 0.56 (L) 0.61 - 1.24 mg/dL   Calcium 8.7 (L) 8.9 - 10.3 mg/dL   Total Protein 7.4 6.5 - 8.1 g/dL   Albumin 3.7 3.5 - 5.0 g/dL   AST 23 15 - 41 U/L   ALT 40 0 - 44 U/L   Alkaline Phosphatase 74 38 - 126 U/L   Total Bilirubin 0.2 (L) 0.3 - 1.2 mg/dL   GFR calc non Af Amer >60 >60 mL/min   GFR calc Af Amer >60 >60 mL/min   Anion gap 13 5 - 15  Comment: Performed at [facility], [place] [address], [place], [place] [address]  Brain natriuretic peptide     Status: None   Collection Time: 05/28/19  5:24 PM  Result Value Ref Range   B Natriuretic Peptide 56.6 0.0 - 100.0 pg/mL    Comment: Performed at [facility], [place] [address], [place], [place] [address]   Dg Chest 2 View  Result Date: 05/28/2019 CLINICAL DATA:  Shortness of breath EXAM: CHEST - 2 VIEW COMPARISON:  May 03, 2019 and March 10, 2019 FINDINGS: Lungs are mildly hyperexpanded. There is interstitial thickening bilaterally, a finding not present on recent prior study. There is no consolidation or volume loss. Heart size and pulmonary vascularity are normal. No adenopathy. There is aortic atherosclerosis. Slight anterior wedging in several thoracic vertebral bodies is stable. IMPRESSION: Lungs mildly hyperexpanded. Central interstitial prominence bilaterally, a finding that potentially may be indicative of a viral type pneumonitis. No consolidation or volume loss evident. No adenopathy. Heart size normal. Aortic Atherosclerosis (ICD10-I70.0). Electronically Signed   By: Lowella Grip III M.D.   On: 05/28/2019 16:11   Ct Angio Chest Pe W/cm &/or Wo Cm  Result Date:  05/28/2019 CLINICAL DATA:  Pneumonia.  Concern for PE. EXAM: CT ANGIOGRAPHY CHEST WITH CONTRAST TECHNIQUE: Multidetector CT imaging of the chest was performed using the standard protocol during bolus administration of intravenous contrast. Multiplanar CT image reconstructions and MIPs were obtained to evaluate the vascular anatomy. CONTRAST:  145mL OMNIPAQUE IOHEXOL 350 MG/ML SOLN COMPARISON:  CT dated 03/10/2019 FINDINGS: Cardiovascular: Evaluation for pulmonary emboli is limited by motion artifact. Given this limitation, there is no definite PE. The heart size is normal. There are some mild aortic calcifications and coronary artery calcifications. Mediastinum/Nodes: No enlarged mediastinal, hilar, or axillary lymph nodes. Thyroid gland, trachea, and esophagus demonstrate no significant findings. Lungs/Pleura: There is calcified pleuroparenchymal scarring at the lung apices. There are calcified pulmonary nodules bilaterally. There are moderate emphysematous changes bilaterally. There is debris within the trachea. There is a stable pulmonary nodule in the left upper lobe measuring approximately 5 mm. There is scarring at the right lung base which appears stable. There is no pneumothorax. No significant pleural effusion. Upper Abdomen: No acute abnormality. 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 Musculoskeletal: There is a new compression deformity of T8 vertebral body, not visualized on prior CT dated 03/18 2020. there is approximately 25% height loss anteriorly. Review of the MIP images confirms the above findings. IMPRESSION: 1. Examination limited by motion artifact. Given this limitation, no definite PE identified on today's exam. 2. Stable bilateral pulmonary nodules. Continued follow-up is recommended as detailed on prior CT. 3. Moderate emphysematous changes bilaterally. Debris is noted within the trachea. 4. New mild age-indeterminate compression  fracture of the T8 vertebral body. This is new since 03/10/2019 CT. Correlation with point tenderness is recommended. Aortic Atherosclerosis (ICD10-I70.0) and Emphysema (ICD10-J43.9). Electronically Signed   By: Constance Holster M.D.   On: 05/28/2019 21:17    Pending Labs Unresulted Labs (From admission, onward)    Start     Ordered   05/28/19 2315  HIV antibody  Once,   R     05/28/19 2320          Vitals/Pain Today's Vitals   05/28/19 1930 05/28/19 2000 05/28/19 2101 05/28/19 2228  BP: (!) 144/67 (!) 154/74 (!) 153/70 (!) 159/67  Pulse: 92 91 (!) 101 (!) 105  Resp: Marland Kitchen)  22 20 18  (!) 24  Temp:      TempSrc:      SpO2: 97% 99% 97% 97%  PainSc:        Isolation Precautions No active isolations  Medications Medications  0.9 %  sodium chloride infusion ( Intravenous New Bag/Given 05/28/19 1749)  sodium chloride (PF) 0.9 % injection (has no administration in time range)  methylPREDNISolone sodium succinate (SOLU-MEDROL) 125 mg/2 mL injection 60 mg (has no administration in time range)  apixaban (ELIQUIS) tablet 5 mg (5 mg Oral Given 05/28/19 2352)  azithromycin (ZITHROMAX) tablet 250 mg (has no administration in time range)  levalbuterol (XOPENEX) nebulizer solution 0.63 mg (has no administration in time range)  ipratropium (ATROVENT) nebulizer solution 0.5 mg (has no administration in time range)  albuterol (PROVENTIL) (2.5 MG/3ML) 0.083% nebulizer solution 2.5 mg (has no administration in time range)  methylPREDNISolone sodium succinate (SOLU-MEDROL) 125 mg/2 mL injection 125 mg (125 mg Intravenous Given 05/28/19 1959)  albuterol (VENTOLIN HFA) 108 (90 Base) MCG/ACT inhaler 4 puff (4 puffs Inhalation Given 05/28/19 1959)  iohexol (OMNIPAQUE) 350 MG/ML injection 100 mL (100 mLs Intravenous Contrast Given 05/28/19 2037)  albuterol (VENTOLIN HFA) 108 (90 Base) MCG/ACT inhaler 2 puff (2 puffs Inhalation Given 05/28/19 2310)  azithromycin (ZITHROMAX) tablet 500 mg (500 mg Oral Given 05/28/19  2352)    Mobility walks Low fall risk   Focused Assessments Pulmonary Assessment Handoff:  Lung sounds: Bilateral Breath Sounds: Diminished L Breath Sounds: Diminished R Breath Sounds: Diminished O2 Device: Nasal Cannula O2 Flow Rate (L/min): 2 L/min      R Recommendations: See Admitting Provider Note  Report given to:   Additional Notes:

## 2019-05-29 NOTE — Progress Notes (Signed)
PROGRESS NOTE    Paul [name]  TKW:[id number] DOB: Feb 21, 1946 DOA: 05/28/2019 PCP: [name], MD     Brief Narrative:  Paul [name] is a 73 y.o. 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 fib, hyperlipidemia presenting to the hospital from his pulmonologist office for the management of viral pneumonitis.  He is currently being treated with prednisone and dose was increased to 20 mg earlier this week but has not helped. Patient reports 2-day history of worsening shortness of breath and cough productive of thick sputum.  States he has COPD and is chronically on prednisone.  The dose of his prednisone was recently increased but he continues to be symptomatic.  He uses 2 L home oxygen at night and throughout most of the day.    In the emergency department, COVID-19 rapid test negative, chest x-ray revealed mildly hyperexpanded lungs and central interstitial prominence bilaterally indicative of a viral type pneumonitis.  CTA negative for PE.  Showed moderate emphysematous changes bilaterally.  He was admitted for COPD exacerbation and started on IV Solu-Medrol as well as breathing treatments.  New events last 24 hours / Subjective: Doing better this morning but not back to baseline.  Assessment & Plan:   Principal Problem:   COPD with acute exacerbation ([place]) Active Problems:   Paroxysmal atrial fibrillation (HCC)   Vertebral compression fracture (HCC)   Chronic anemia   Pulmonary nodules   Acute COPD exacerbation with chronic hypoxemic respiratory failure -Wears 2L at baseline -COVID-19 rapid test negative -CT angiogram negative for PE and showing moderate emphysematous changes bilaterally  -Continue IV Solu-Medrol  -Continue Azithromycin -Breathing treatments.  Ordered Dulera as well as Incruse  Vertebral compression fracture -Imaging showing new mild age-indeterminate compression fracture of the T8  vertebral body. This is new since 03/10/2019 CT -Patient has no pain to palpation over the compression fracture site, has some left-sided musculoskeletal pain -Pain control  Pulmonary nodules -CT showing stable bilateral pulmonary nodules. Patient will need continued outpatient follow-up  Paroxysmal A. Fib -Currently rate controlled.  Continue home Eliquis.  Continue Lopressor and Cardizem.  Depression -Continue Zoloft  GERD -Continue PPI, Pepcid  Anxiety -Continue home Ativan PRN  Hyperlipidemia -Continue Lipitor   DVT prophylaxis: Eliquis Code Status: Full Family Communication: Spoke with wife over the phone for an update Disposition Plan: Pending further improvement in his respiratory complaints   Consultants:   None  Procedures:   None  Antimicrobials:  Anti-infectives (From admission, onward)   Start     Dose/Rate Route Frequency Ordered Stop   05/29/19 2200  azithromycin (ZITHROMAX) tablet 250 mg     250 mg Oral Daily at bedtime 05/28/19 2320     05/28/19 2330  azithromycin (ZITHROMAX) tablet 500 mg     500 mg Oral  Once 05/28/19 2320 05/28/19 2352        Objective: Vitals:   05/29/19 0232 05/29/19 0518 05/29/19 0837 05/29/19 1025  BP:  140/83  (!) 130/56  Pulse:  93  (!) 104  Resp:  17    Temp:  (!) 97.5 F (36.4 C)    TempSrc:      SpO2: 99% 98% 94%   Weight: 61 kg     Height: 5\' 6"  (1.676 m)       Intake/Output Summary (Last 24 hours) at 05/29/2019 1235 Last data filed at 05/29/2019 1120 Gross per 24 hour  Intake 515 ml  Output 150 ml  Net 365 ml   Filed Weights   05/29/19 0232  Weight: 61 kg    Examination:  General exam: Appears calm and comfortable  Respiratory system: Diminished breath sounds bilaterally without wheezing, respiratory effort normal Cardiovascular system: S1 & S2 heard, RRR. No JVD, murmurs, rubs, gallops or clicks. No pedal edema. Gastrointestinal system: Abdomen is nondistended, soft and nontender. No  organomegaly or masses felt. Normal bowel sounds heard. Central nervous system: Alert and oriented. No focal neurological deficits. Extremities: Symmetric 5 x 5 power. Skin: No rashes, lesions or ulcers Psychiatry: Judgement and insight appear normal. Mood & affect appropriate.   Data Reviewed: I have personally reviewed following labs and imaging studies  CBC: Recent Labs  Lab 05/28/19 1724  WBC 9.9  NEUTROABS 8.8*  HGB 11.9*  HCT 39.6  MCV 81.8  PLT 353   Basic Metabolic Panel: Recent Labs  Lab 05/28/19 1724  NA 133*  K 4.1  CL 91*  CO2 29  GLUCOSE 167*  BUN 11  CREATININE 0.56*  CALCIUM 8.7*   GFR: Estimated Creatinine Clearance: 69.9 mL/min (A) (by C-G formula based on SCr of 0.56 mg/dL (L)). Liver Function Tests: Recent Labs  Lab 05/28/19 1724  AST 23  ALT 40  ALKPHOS 74  BILITOT 0.2*  PROT 7.4  ALBUMIN 3.7   No results for input(s): LIPASE, AMYLASE in the last 168 hours. No results for input(s): AMMONIA in the last 168 hours. Coagulation Profile: No results for input(s): INR, PROTIME in the last 168 hours. Cardiac Enzymes: No results for input(s): CKTOTAL, CKMB, CKMBINDEX, TROPONINI in the last 168 hours. BNP (last 3 results) No results for input(s): PROBNP in the last 8760 hours. HbA1C: No results for input(s): HGBA1C in the last 72 hours. CBG: No results for input(s): GLUCAP in the last 168 hours. Lipid Profile: No results for input(s): CHOL, HDL, LDLCALC, TRIG, CHOLHDL, LDLDIRECT in the last 72 hours. Thyroid Function Tests: No results for input(s): TSH, T4TOTAL, FREET4, T3FREE, THYROIDAB in the last 72 hours. Anemia Panel: No results for input(s): VITAMINB12, FOLATE, FERRITIN, TIBC, IRON, RETICCTPCT in the last 72 hours. Sepsis Labs: No results for input(s): PROCALCITON, LATICACIDVEN in the last 168 hours.  Recent Results (from the past 240 hour(s))  SARS Coronavirus 2 (CEPHEID- Performed in [facility] hospital lab), Hosp Order     Status:  None   Collection Time: 05/28/19  5:20 PM  Result Value Ref Range Status   SARS Coronavirus 2 NEGATIVE NEGATIVE Final    Comment: (NOTE) If result is NEGATIVE SARS-CoV-2 target nucleic acids are NOT DETECTED. The SARS-CoV-2 RNA is generally detectable in upper and lower  respiratory specimens during the acute phase of infection. The lowest  concentration of SARS-CoV-2 viral copies this assay can detect is 250  copies / mL. A negative result does not preclude SARS-CoV-2 infection  and should not be used as the sole basis for treatment or other  patient management decisions.  A negative result may occur with  improper specimen collection / handling, submission of specimen other  than nasopharyngeal swab, presence of viral mutation(s) within the  areas targeted by this assay, and inadequate number of viral copies  (<250 copies / mL). A negative result must be combined with clinical  observations, patient history, and epidemiological information. If result is POSITIVE SARS-CoV-2 target nucleic acids are DETECTED. The SARS-CoV-2 RNA is generally detectable in upper and lower  respiratory specimens dur ing the acute phase of infection.  Positive  results are indicative  of active infection with SARS-CoV-2.  Clinical  correlation with patient history and other diagnostic information is  necessary to determine patient infection status.  Positive results do  not rule out bacterial infection or co-infection with other viruses. If result is PRESUMPTIVE POSTIVE SARS-CoV-2 nucleic acids MAY BE PRESENT.   A presumptive positive result was obtained on the submitted specimen  and confirmed on repeat testing.  While 2019 novel coronavirus  (SARS-CoV-2) nucleic acids may be present in the submitted sample  additional confirmatory testing may be necessary for epidemiological  and / or clinical management purposes  to differentiate between  SARS-CoV-2 and other Sarbecovirus currently known to infect  humans.  If clinically indicated additional testing with an alternate test  methodology [phone]) is advised. The SARS-CoV-2 RNA is generally  detectable in upper and lower respiratory sp ecimens during the acute  phase of infection. The expected result is Negative. Fact Sheet for Patients:  StrictlyIdeas.no Fact Sheet for Healthcare Providers: BankingDealers.co.za This test is not yet approved or cleared by the [place] FDA and has been authorized for detection and/or diagnosis of SARS-CoV-2 by FDA under an Emergency Use Authorization (EUA).  This EUA will remain in effect (meaning this test can be used) for the duration of the COVID-19 declaration under Section 564(b)(1) of the Act, 21 U.S.C. section 360bbb-3(b)(1), unless the authorization is terminated or revoked sooner. Performed at [facility], [place] [address], [place], [place] [address]        Radiology Studies: Dg Chest 2 View  Result Date: 05/28/2019 CLINICAL DATA:  Shortness of breath EXAM: CHEST - 2 VIEW COMPARISON:  May 03, 2019 and March 10, 2019 FINDINGS: Lungs are mildly hyperexpanded. There is interstitial thickening bilaterally, a finding not present on recent prior study. There is no consolidation or volume loss. Heart size and pulmonary vascularity are normal. No adenopathy. There is aortic atherosclerosis. Slight anterior wedging in several thoracic vertebral bodies is stable. IMPRESSION: Lungs mildly hyperexpanded. Central interstitial prominence bilaterally, a finding that potentially may be indicative of a viral type pneumonitis. No consolidation or volume loss evident. No adenopathy. Heart size normal. Aortic Atherosclerosis (ICD10-I70.0). Electronically Signed   By: Lowella Grip III M.D.   On: 05/28/2019 16:11   Ct Angio Chest Pe W/cm &/or Wo Cm  Result Date: 05/28/2019 CLINICAL DATA:  Pneumonia.  Concern for PE. EXAM: CT ANGIOGRAPHY CHEST  WITH CONTRAST TECHNIQUE: Multidetector CT imaging of the chest was performed using the standard protocol during bolus administration of intravenous contrast. Multiplanar CT image reconstructions and MIPs were obtained to evaluate the vascular anatomy. CONTRAST:  152mL OMNIPAQUE IOHEXOL 350 MG/ML SOLN COMPARISON:  CT dated 03/10/2019 FINDINGS: Cardiovascular: Evaluation for pulmonary emboli is limited by motion artifact. Given this limitation, there is no definite PE. The heart size is normal. There are some mild aortic calcifications and coronary artery calcifications. Mediastinum/Nodes: No enlarged mediastinal, hilar, or axillary lymph nodes. Thyroid gland, trachea, and esophagus demonstrate no significant findings. Lungs/Pleura: There is calcified pleuroparenchymal scarring at the lung apices. There are calcified pulmonary nodules bilaterally. There are moderate emphysematous changes bilaterally. There is debris within the trachea. There is a stable pulmonary nodule in the left upper lobe measuring approximately 5 mm. There is scarring at the right lung base which appears stable. There is no pneumothorax. No significant pleural effusion. Upper Abdomen: No acute abnormality. 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 Musculoskeletal: There is a new compression deformity  of T8 vertebral body, not visualized on prior CT dated 03/18 2020. there is approximately 25% height loss anteriorly. Review of the MIP images confirms the above findings. IMPRESSION: 1. Examination limited by motion artifact. Given this limitation, no definite PE identified on today's exam. 2. Stable bilateral pulmonary nodules. Continued follow-up is recommended as detailed on prior CT. 3. Moderate emphysematous changes bilaterally. Debris is noted within the trachea. 4. New mild age-indeterminate compression fracture of the T8 vertebral body. This is new since 03/10/2019 CT. Correlation  with point tenderness is recommended. Aortic Atherosclerosis (ICD10-I70.0) and Emphysema (ICD10-J43.9). Electronically Signed   By: Constance Holster M.D.   On: 05/28/2019 21:17      Scheduled Meds:  apixaban  5 mg Oral BID   atorvastatin  40 mg Oral QPC supper   azithromycin  250 mg Oral QHS   diltiazem  360 mg Oral Daily   famotidine  20 mg Oral QPC supper   ipratropium  0.5 mg Nebulization TID   levalbuterol  0.63 mg Nebulization TID   loratadine  10 mg Oral Daily   methylPREDNISolone (SOLU-MEDROL) injection  60 mg Intravenous Q8H   metoprolol tartrate  25 mg Oral BID   pantoprazole  40 mg Oral Daily   sertraline  50 mg Oral Daily   tiotropium  18 mcg Inhalation Daily   Continuous Infusions:  sodium chloride 10 mL/hr at 05/29/19 0335     LOS: 0 days    Time spent: 35 minutes   Dessa Phi, DO [facility] [email or web address] 05/29/2019, 12:35 PM

## 2019-05-29 NOTE — Plan of Care (Signed)
  Problem: Acute Rehab PT Goals(only PT should resolve) Goal: Patient Will Transfer Sit To/From Stand Outcome: Completed/Met   Problem: Acute Rehab PT Goals(only PT should resolve) Goal: Pt Will Transfer Bed To Chair/Chair To Bed Outcome: Completed/Met   Problem: Acute Rehab PT Goals(only PT should resolve) Goal: Pt Will Ambulate Outcome: Completed/Met

## 2019-05-29 NOTE — Evaluation (Signed)
Physical Therapy Evaluation Patient Details Name: Paul [name] MRN: [id number] DOB: Oct 28, 1946 Today's Date: 05/29/2019   History of Present Illness  Anson Peddie is a 73 y.o. 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 fib, hyperlipidemia presenting to the hospital from his pulmonologist office for the management of viral pneumonitis.  He is currently being treated with prednisone and dose was increased to 20 mg earlier this week but has not helped. Patient reports 2-day history of worsening shortness of breath and cough productive of thick sputum.  States he has COPD and is chronically on prednisone.  The dose of his prednisone was recently increased but he continues to be symptomatic.  He uses 2 L home oxygen at night and throughout most of the day.    In the emergency department, COVID-19 rapid test negative, chest x-ray revealed mildly hyperexpanded lungs and central interstitial prominence bilaterally indicative of a viral type pneumonitis.  CTA negative for PE.  Showed moderate emphysematous changes bilaterally.  He was admitted for COPD exacerbation and started on IV Solu-Medrol as well as breathing treatments.    Clinical Impression  Mr. Golson is functioning close to his baseline of independent with no device for mobility with supplemental oxygen at home. He was able to ambulate ~200 feet today with supervision and performed all transfers at Mod I level. He required cues while ambulating to stop for standing rest breaks when becoming slightly winded. During ambulation SpO2 remained between 93-97% and HR remained between 97-109 bpm. He denied chest pain, SOB, chest pressure, dizziness, or lightheadedness thoughtout session. Pt does not have any stairs to enter his home and lives ina 1 level town house. He lives with his wife who is available 24/7 and is independent with managing supplemental O2 at home. As he is  functioning at/near baseline he does not require acute PT services and is safe to mobilize with nursing staff. He has been educated on pursed lip breathing, benefits of mobilizing for respiratory health and on sitting up for respiratory health. He was given goal to walk 3x/day while here and this was communicated to RN staff. Please re-consult PT if there is a change in functional status.    Follow Up Recommendations No PT follow up    Equipment Recommendations  None recommended by PT    Recommendations for Other Services       Precautions / Restrictions Precautions Precautions: Fall Precaution Comments: monitor O2 Restrictions Weight Bearing Restrictions: No      Mobility  Bed Mobility Overal bed mobility: Modified Independent     Transfers Overall transfer level: Modified independent Equipment used: Rolling walker (2 wheeled) Transfers: Sit to/from Stand Sit to Stand: Modified independent (Device/Increase time)         General transfer comment: pt managed O2 line himself, all other lines disconnected  Ambulation/Gait Ambulation/Gait assistance: Supervision Gait Distance (Feet): 200 Feet Assistive device: None Gait Pattern/deviations: Step-through pattern;WFL(Within Functional Limits);Wide base of support     General Gait Details: pt with slight external rotation of bil hips in standing and walking, overall steady gait with no assistance, required cues to stop for standing rest break 3x and monitor SpO2 (maintained SpO2 at 93-97 and HR at 97-109)        Balance Overall balance assessment: Modified Independent           Pertinent Vitals/Pain Pain Assessment: No/denies pain    Home Living Family/patient expects to be discharged  to:: Private residence Living Arrangements: Spouse/significant other Available Help at Discharge: Family;Available 24 hours/day Type of Home: House Home Access: Level entry     Home Layout: One level Home Equipment: Shower seat -  built in Additional Comments: pt reports he wears supplemental O2 at home (2L/min) and has 2 portable tanks and 1 large tank as well. He has no assistive devices for mobility and has never needed to use one.    Prior Function Level of Independence: Independent         Comments: ind at baseline, self monitors O2 sats, pt's wife helps him manage medications        Extremity/Trunk Assessment   Upper Extremity Assessment Upper Extremity Assessment: Defer to OT evaluation    Lower Extremity Assessment Lower Extremity Assessment: Overall WFL for tasks assessed    Cervical / Trunk Assessment Cervical / Trunk Assessment: Normal  Communication   Communication: No difficulties  Cognition Arousal/Alertness: Awake/alert Behavior During Therapy: WFL for tasks assessed/performed Overall Cognitive Status: Within Functional Limits for tasks assessed              Exercises Other Exercises Other Exercises: Educated on pursed lip breathing throughout session (cues to breath out for 5-8 seconds)   Assessment/Plan    PT Assessment Patent does not need any further PT services  PT Problem List         PT Treatment Interventions      PT Goals (Current goals can be found in the Care Plan section)  Acute Rehab PT Goals Patient Stated Goal: per OT patient enjoys gols and wants to get back to playing golf PT Goal Formulation: With patient Time For Goal Achievement: 06/05/19 Potential to Achieve Goals: Good    Frequency  1x Evaluation    AM-PAC PT "6 Clicks" Mobility  Outcome Measure Help needed turning from your back to your side while in a flat bed without using bedrails?: None Help needed moving from lying on your back to sitting on the side of a flat bed without using bedrails?: None Help needed moving to and from a bed to a chair (including a wheelchair)?: None Help needed standing up from a chair using your arms (e.g., wheelchair or bedside chair)?: None Help needed to walk  in hospital room?: A Little Help needed climbing 3-5 steps with a railing? : A Little 6 Click Score: 22    End of Session Equipment Utilized During Treatment: Gait belt Activity Tolerance: Patient tolerated treatment well Patient left: in chair;with call bell/phone within reach Nurse Communication: Mobility status PT Visit Diagnosis: Other abnormalities of gait and mobility (R26.89)    Time: 1478-2956 PT Time Calculation (min) (ACUTE ONLY): 21 min   Charges:   PT Evaluation $PT Eval Moderate Complexity: 1 Mod          Kipp Brood, PT, DPT, [facility] Physical Therapist with [facility]  05/29/2019 1:29 PM

## 2019-05-29 NOTE — Evaluation (Signed)
Occupational Therapy Evaluation Patient Details Name: Paul [name] MRN: [id number] DOB: July 26, 1946 Today's Date: 05/29/2019    History of Present Illness  Paul [name] is a 73 y.o. 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 fib, hyperlipidemia presenting to the hospital from his pulmonologist office for the management of viral pneumonitis. Of note, previous admission at [facility] on 5/12 for respiratory failure   Clinical Impression   Pt admitted with above diagnoses, cardiopulmonary status limiting ability to engage in BADL at desired level of ind. PTA pt ind with BADL, monitoring own O2 sats, with wife assist in some IADL management. He shares how he enjoys to play golf when feeling able. Pt is mod I- supervision (for lines) with functional and OOB mobility. He stood at sink to wash up face, underarms, peri area and stood to use urinal. VSS with 2L [place]. Pt sats 89 without O2 with OOB mobility. Discussed ECS strategies, pt able to teach many of them back such as pursed lip breathing. All OT needs med, no equipment required. No further OT needs identified, no follow up OT indicated, OT will sign off at this time.    Follow Up Recommendations  No OT follow up    Equipment Recommendations  None recommended by OT    Recommendations for Other Services       Precautions / Restrictions Precautions Precautions: Fall Precaution Comments: monitor O2 Restrictions Weight Bearing Restrictions: No      Mobility Bed Mobility Overal bed mobility: Modified Independent                Transfers Overall transfer level: Needs assistance   Transfers: Sit to/from Stand Sit to Stand: Supervision         General transfer comment: supervision for management of lines    Balance Overall balance assessment: Modified Independent                                         ADL either performed or assessed with clinical  judgement   ADL Overall ADL's : Needs assistance/impaired Eating/Feeding: Modified independent   Grooming: Supervision/safety;Standing Grooming Details (indicate cue type and reason): standing at sink to wash underarms, apply deoderant, wash face Upper Body Bathing: Supervision/ safety;Standing Upper Body Bathing Details (indicate cue type and reason): at sink Lower Body Bathing: Min guard;Sit to/from stand;Sitting/lateral leans   Upper Body Dressing : Modified independent   Lower Body Dressing: Minimal assistance Lower Body Dressing Details (indicate cue type and reason): occasional min A due to discomfort with breathing when leaning over; modifies by dressing in bed, wearing slip ons, or wife assists Toilet Transfer: Copy Details (indicate cue type and reason): standing using urinal Toileting- Clothing Manipulation and Hygiene: Modified independent   Tub/ Banker: Modified independent   Functional mobility during ADLs: Modified independent General ADL Comments: pt completed standing ADL activity, appears well versed in ECS strategies by showing teach back principles     Vision Patient Visual Report: No change from baseline       Perception     Praxis      Pertinent Vitals/Pain Pain Assessment: No/denies pain     Hand Dominance     Extremity/Trunk Assessment Upper Extremity Assessment Upper Extremity Assessment: Overall WFL for tasks assessed   Lower Extremity Assessment Lower Extremity Assessment: Defer to PT evaluation  Communication Communication Communication: No difficulties   Cognition Arousal/Alertness: Awake/alert Behavior During Therapy: WFL for tasks assessed/performed Overall Cognitive Status: Within Functional Limits for tasks assessed                                     General Comments       Exercises     Shoulder Instructions      Home Living Family/patient expects to be  discharged to:: Private residence Living Arrangements: Spouse/significant other Available Help at Discharge: Family;Available 24 hours/day Type of Home: House Home Access: Level entry     Home Layout: One level     Bathroom Shower/Tub: Walk-in shower         Home Equipment: Shower seat - built in   Additional Comments: wears 2L O2 mostly at night, needing more during BADL activity recently      Prior Functioning/Environment Level of Independence: Independent        Comments: ind at baseline, self monitors O2 sats        OT Problem List: Decreased activity tolerance;Decreased knowledge of use of DME or AE;Cardiopulmonary status limiting activity      OT Treatment/Interventions:      OT Goals(Current goals can be found in the care plan section) Acute Rehab OT Goals Patient Stated Goal: to get back to playing golf OT Goal Formulation: With patient Time For Goal Achievement: 06/12/19 Potential to Achieve Goals: Good  OT Frequency:     Barriers to D/C:            Co-evaluation              AM-PAC OT "6 Clicks" Daily Activity     Outcome Measure Help from another person eating meals?: None Help from another person taking care of personal grooming?: None Help from another person toileting, which includes using toliet, bedpan, or urinal?: None Help from another person bathing (including washing, rinsing, drying)?: None Help from another person to put on and taking off regular upper body clothing?: None Help from another person to put on and taking off regular lower body clothing?: A Little 6 Click Score: 23   End of Session Equipment Utilized During Treatment: Oxygen Nurse Communication: Mobility status(150cc output)  Activity Tolerance: Patient tolerated treatment well Patient left: in chair;with call bell/phone within reach  OT Visit Diagnosis: Other abnormalities of gait and mobility (R26.89)                Time: 0539-7673 OT Time Calculation (min):  38 min Charges:  OT General Charges $OT Visit: 1 Visit OT Evaluation $OT Eval Low Complexity: 1 Low OT Treatments $Self Care/Home Management : 8-22 mins  Zenovia Jarred, MSOT, OTR/L [place] OT/ Acute Relief OT [facility] Office: [phone]   Zenovia Jarred 05/29/2019, 12:08 PM

## 2019-05-29 NOTE — Progress Notes (Signed)
PT demonstrated hands on understanding of Flutter device- PC, small, green, thick.

## 2019-05-30 LAB — HIV ANTIBODY (ROUTINE TESTING W REFLEX): HIV Screen 4th Generation wRfx: NONREACTIVE

## 2019-05-30 MED ORDER — ENSURE ENLIVE PO LIQD
237.0000 mL | ORAL | Status: DC
  Administered 2019-05-30 – 2019-06-02 (×4): 237 mL via ORAL

## 2019-05-30 MED ORDER — NON FORMULARY: 6.0000 mg | Freq: Once | Status: DC

## 2019-05-30 MED ORDER — MELATONIN 3 MG PO TABS
6.0000 mg | ORAL_TABLET | Freq: Once | ORAL | Status: AC
  Administered 2019-05-30: 6 mg via ORAL
  Filled 2019-05-30: qty 2

## 2019-05-30 MED ORDER — NON FORMULARY: 3.0000 mg | Freq: Once | Status: DC

## 2019-05-30 MED ORDER — MELATONIN 3 MG PO TABS
3.0000 mg | ORAL_TABLET | Freq: Once | ORAL | Status: AC
  Administered 2019-05-30: 3 mg via ORAL
  Filled 2019-05-30: qty 1

## 2019-05-30 NOTE — Progress Notes (Signed)
Initial Nutrition Assessment  RD working remotely.   DOCUMENTATION CODES:   Not applicable  INTERVENTION:  - will order Ensure Enlive once/day, each supplement provides 350 kcal and 20 grams of protein. - continue to encourage PO intakes.    NUTRITION DIAGNOSIS:   Increased nutrient needs related to acute illness, chronic illness as evidenced by estimated needs.  GOAL:   Patient will meet greater than or equal to 90% of their needs  MONITOR:   PO intake, Supplement acceptance, Labs, Weight trends  REASON FOR ASSESSMENT:   Consult Assessment of nutrition requirement/status  ASSESSMENT:   73 y.o. male with medical history significant of stage 3 COPD on 2L home oxygen, COPD Gold patient, chronic respiratory failure, A. fib, and hyperlipidemia. Patient presented to the ED from his Pulmonologist's office for the management of viral pneumonitis. Patient reported 2-day history of worsening SOB and cough productive of thick sputum. In the ED, COVID-19 rapid test negative, CXR revealed mildly hyperexpanded lungs and central interstitial prominence bilaterally indicative of a viral type pneumonitis. CTA negative for PE, showed moderate emphysematous changes bilaterally. He was admitted for COPD exacerbation and started on Solu-Medrol and breathing treatments.  Patient consumed 100% of breakfast and lunch and 75% of dinner yesterday; he has not yet received breakfast this AM. Patient reports good appetite at home and since admission with no changes. He denies any chewing or swallowing issues, no issues with eating 2/2 feeling short of breath or eating or drinking leading to shortness of breath.   Per chart review, current weight is 134 lb and weight on 03/10/19 was 144 lb. This indicates 10 lb weight loss (7% body weight) in the past 3 months; not significant for time frame.   Per notes: acute COPD exacerbation, vertebral compression fx--new since CT 03/20/19 but unsure of when it occurred;  pulmonary nodules--stable, bilateral.   Medications reviewed; 20 mg oral pepcid after dinner each day, 3 mg melatonin x1 dose 6/7, 60 mg solu-medrol TID, 40 mg oral protonix/day. Labs reviewed; Na: 133 mmol/l, Cl: 91 mmol/l, creatinine: 0.56 mg/dl, Ca: 8.7 mg/dl.      NUTRITION - FOCUSED PHYSICAL EXAM:  unable to complete at this time.   Diet Order:   Diet Order            Diet Heart Room service appropriate? Yes; Fluid consistency: Thin  Diet effective now              EDUCATION NEEDS:   No education needs have been identified at this time  Skin:  Skin Assessment: Reviewed RN Assessment  Last BM:  PTA/unknown  Height:   Ht Readings from Last 1 Encounters:  05/29/19 5\' 6"  (1.676 m)    Weight:   Wt Readings from Last 1 Encounters:  05/29/19 61 kg    Ideal Body Weight:  64.5 kg  BMI:  Body mass index is 21.71 kg/m.  Estimated Nutritional Needs:   Kcal:  1770-1950 kcal  Protein:  80-90 grams  Fluid:  >/= 1.8 L/day      Jarome Matin, MS, RD, LDN, [facility] Inpatient Clinical Dietitian Pager # [phone] After hours/weekend pager # [phone]

## 2019-05-30 NOTE — Progress Notes (Signed)
PT demonstrates hands on understanding of Flutter device- NPC at this time. 

## 2019-05-30 NOTE — Progress Notes (Signed)
PT demonstrated hands on understanding of Flutter device- PC at this time- small, green, thick.

## 2019-05-30 NOTE — Progress Notes (Signed)
PROGRESS NOTE    Paul [name]  SMO:[id number] DOB: 1946/01/22 DOA: 05/28/2019 PCP: [name], MD     Brief Narrative:  Paul [name] is a 73 y.o. 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 fib, hyperlipidemia presenting to the hospital from his pulmonologist office for the management of viral pneumonitis.  He is currently being treated with prednisone and dose was increased to 20 mg earlier this week but has not helped. Patient reports 2-day history of worsening shortness of breath and cough productive of thick sputum.  States he has COPD and is chronically on prednisone.  The dose of his prednisone was recently increased but he continues to be symptomatic.  He uses 2 L home oxygen at night and throughout most of the day.    In the emergency department, COVID-19 rapid test negative, chest x-ray revealed mildly hyperexpanded lungs and central interstitial prominence bilaterally indicative of a viral type pneumonitis.  CTA negative for PE.  Showed moderate emphysematous changes bilaterally.  He was admitted for COPD exacerbation and started on IV Solu-Medrol as well as breathing treatments.  New events last 24 hours / Subjective: No new complaints, just got back in bed from the bathroom and appears to be working harder to breathe. Eating breakfast.   Assessment & Plan:   Principal Problem:   COPD with acute exacerbation ([place]) Active Problems:   Paroxysmal atrial fibrillation (HCC)   Vertebral compression fracture (HCC)   Chronic anemia   Pulmonary nodules   Acute COPD exacerbation with chronic hypoxemic respiratory failure -Wears 2L at baseline -COVID-19 rapid test negative -CT angiogram negative for PE and showing moderate emphysematous changes bilaterally  -Continue IV Solu-Medrol  -Continue Azithromycin -Breathing treatments.  Ordered Dulera as well as Incruse   Vertebral compression fracture -Imaging  showing new mild age-indeterminate compression fracture of the T8 vertebral body. This is new since 03/10/2019 CT -Patient has no pain to palpation over the compression fracture site, has some left-sided musculoskeletal pain -Pain control  Pulmonary nodules -CT showing stable bilateral pulmonary nodules. Patient will need continued outpatient follow-up  Paroxysmal A. Fib -Currently rate controlled.  Continue home Eliquis.  Continue Lopressor and Cardizem.  Depression -Continue Zoloft  GERD -Continue PPI, Pepcid  Anxiety -Continue home Ativan PRN  Hyperlipidemia -Continue Lipitor   DVT prophylaxis: Eliquis Code Status: Full Family Communication: Spoke with wife over the phone for an update this morning  Disposition Plan: Pending further improvement in his respiratory status, not back to baseline yet    Consultants:   None  Procedures:   None  Antimicrobials:  Anti-infectives (From admission, onward)   Start     Dose/Rate Route Frequency Ordered Stop   05/29/19 2200  azithromycin (ZITHROMAX) tablet 250 mg     250 mg Oral Daily at bedtime 05/28/19 2320     05/28/19 2330  azithromycin (ZITHROMAX) tablet 500 mg     500 mg Oral  Once 05/28/19 2320 05/28/19 2352       Objective: Vitals:   05/29/19 2050 05/29/19 2105 05/30/19 0626 05/30/19 0844  BP:  138/77 136/73   Pulse:  91 86   Resp:   18   Temp:   98.1 F (36.7 C)   TempSrc:   Oral   SpO2: 93%  98% 91%  Weight:      Height:        Intake/Output Summary (Last 24 hours) at 05/30/2019 1036 Last data filed  at 05/30/2019 0500 Gross per 24 hour  Intake 580 ml  Output 650 ml  Net -70 ml   Filed Weights   05/29/19 0232  Weight: 61 kg    Examination: General exam: Appears calm and comfortable  Respiratory system: Clear to auscultation upper lung fields, diminished bases, on room air and tachypneic but just got back in bed after using the bathroom, no wheezing  Cardiovascular system: S1 & S2 heard,  RRR. No JVD, murmurs, rubs, gallops or clicks. No pedal edema. Gastrointestinal system: Abdomen is nondistended, soft and nontender. No organomegaly or masses felt. Normal bowel sounds heard. Central nervous system: Alert and oriented. No focal neurological deficits. Extremities: Symmetric 5 x 5 power. Skin: No rashes, lesions or ulcers Psychiatry: Judgement and insight appear normal. Mood & affect appropriate.    Data Reviewed: I have personally reviewed following labs and imaging studies  CBC: Recent Labs  Lab 05/28/19 1724  WBC 9.9  NEUTROABS 8.8*  HGB 11.9*  HCT 39.6  MCV 81.8  PLT 093   Basic Metabolic Panel: Recent Labs  Lab 05/28/19 1724  NA 133*  K 4.1  CL 91*  CO2 29  GLUCOSE 167*  BUN 11  CREATININE 0.56*  CALCIUM 8.7*   GFR: Estimated Creatinine Clearance: 69.9 mL/min (A) (by C-G formula based on SCr of 0.56 mg/dL (L)). Liver Function Tests: Recent Labs  Lab 05/28/19 1724  AST 23  ALT 40  ALKPHOS 74  BILITOT 0.2*  PROT 7.4  ALBUMIN 3.7   No results for input(s): LIPASE, AMYLASE in the last 168 hours. No results for input(s): AMMONIA in the last 168 hours. Coagulation Profile: No results for input(s): INR, PROTIME in the last 168 hours. Cardiac Enzymes: No results for input(s): CKTOTAL, CKMB, CKMBINDEX, TROPONINI in the last 168 hours. BNP (last 3 results) No results for input(s): PROBNP in the last 8760 hours. HbA1C: No results for input(s): HGBA1C in the last 72 hours. CBG: No results for input(s): GLUCAP in the last 168 hours. Lipid Profile: No results for input(s): CHOL, HDL, LDLCALC, TRIG, CHOLHDL, LDLDIRECT in the last 72 hours. Thyroid Function Tests: No results for input(s): TSH, T4TOTAL, FREET4, T3FREE, THYROIDAB in the last 72 hours. Anemia Panel: No results for input(s): VITAMINB12, FOLATE, FERRITIN, TIBC, IRON, RETICCTPCT in the last 72 hours. Sepsis Labs: No results for input(s): PROCALCITON, LATICACIDVEN in the last 168  hours.  Recent Results (from the past 240 hour(s))  SARS Coronavirus 2 (CEPHEID- Performed in [place] hospital lab), Hosp Order     Status: None   Collection Time: 05/28/19  5:20 PM  Result Value Ref Range Status   SARS Coronavirus 2 NEGATIVE NEGATIVE Final    Comment: (NOTE) If result is NEGATIVE SARS-CoV-2 target nucleic acids are NOT DETECTED. The SARS-CoV-2 RNA is generally detectable in upper and lower  respiratory specimens during the acute phase of infection. The lowest  concentration of SARS-CoV-2 viral copies this assay can detect is 250  copies / mL. A negative result does not preclude SARS-CoV-2 infection  and should not be used as the sole basis for treatment or other  patient management decisions.  A negative result may occur with  improper specimen collection / handling, submission of specimen other  than nasopharyngeal swab, presence of viral mutation(s) within the  areas targeted by this assay, and inadequate number of viral copies  (<250 copies / mL). A negative result must be combined with clinical  observations, patient history, and epidemiological information. If result is  POSITIVE SARS-CoV-2 target nucleic acids are DETECTED. The SARS-CoV-2 RNA is generally detectable in upper and lower  respiratory specimens dur ing the acute phase of infection.  Positive  results are indicative of active infection with SARS-CoV-2.  Clinical  correlation with patient history and other diagnostic information is  necessary to determine patient infection status.  Positive results do  not rule out bacterial infection or co-infection with other viruses. If result is PRESUMPTIVE POSTIVE SARS-CoV-2 nucleic acids MAY BE PRESENT.   A presumptive positive result was obtained on the submitted specimen  and confirmed on repeat testing.  While 2019 novel coronavirus  (SARS-CoV-2) nucleic acids may be present in the submitted sample  additional confirmatory testing may be necessary for  epidemiological  and / or clinical management purposes  to differentiate between  SARS-CoV-2 and other Sarbecovirus currently known to infect humans.  If clinically indicated additional testing with an alternate test  methodology [phone]) is advised. The SARS-CoV-2 RNA is generally  detectable in upper and lower respiratory sp ecimens during the acute  phase of infection. The expected result is Negative. Fact Sheet for Patients:  StrictlyIdeas.no Fact Sheet for Healthcare Providers: BankingDealers.co.za This test is not yet approved or cleared by the [place] FDA and has been authorized for detection and/or diagnosis of SARS-CoV-2 by FDA under an Emergency Use Authorization (EUA).  This EUA will remain in effect (meaning this test can be used) for the duration of the COVID-19 declaration under Section 564(b)(1) of the Act, 21 U.S.C. section 360bbb-3(b)(1), unless the authorization is terminated or revoked sooner. Performed at  [facility], [place] [address], [place], [place] [address]        Radiology Studies: Dg Chest 2 View  Result Date: 05/28/2019 CLINICAL DATA:  Shortness of breath EXAM: CHEST - 2 VIEW COMPARISON:  May 03, 2019 and March 10, 2019 FINDINGS: Lungs are mildly hyperexpanded. There is interstitial thickening bilaterally, a finding not present on recent prior study. There is no consolidation or volume loss. Heart size and pulmonary vascularity are normal. No adenopathy. There is aortic atherosclerosis. Slight anterior wedging in several thoracic vertebral bodies is stable. IMPRESSION: Lungs mildly hyperexpanded. Central interstitial prominence bilaterally, a finding that potentially may be indicative of a viral type pneumonitis. No consolidation or volume loss evident. No adenopathy. Heart size normal. Aortic Atherosclerosis (ICD10-I70.0). Electronically Signed   By: Lowella Grip III M.D.   On:  05/28/2019 16:11   Ct Angio Chest Pe W/cm &/or Wo Cm  Result Date: 05/28/2019 CLINICAL DATA:  Pneumonia.  Concern for PE. EXAM: CT ANGIOGRAPHY CHEST WITH CONTRAST TECHNIQUE: Multidetector CT imaging of the chest was performed using the standard protocol during bolus administration of intravenous contrast. Multiplanar CT image reconstructions and MIPs were obtained to evaluate the vascular anatomy. CONTRAST:  119mL OMNIPAQUE IOHEXOL 350 MG/ML SOLN COMPARISON:  CT dated 03/10/2019 FINDINGS: Cardiovascular: Evaluation for pulmonary emboli is limited by motion artifact. Given this limitation, there is no definite PE. The heart size is normal. There are some mild aortic calcifications and coronary artery calcifications. Mediastinum/Nodes: No enlarged mediastinal, hilar, or axillary lymph nodes. Thyroid gland, trachea, and esophagus demonstrate no significant findings. Lungs/Pleura: There is calcified pleuroparenchymal scarring at the lung apices. There are calcified pulmonary nodules bilaterally. There are moderate emphysematous changes bilaterally. There is debris within the trachea. There is a stable pulmonary nodule in the left upper lobe measuring approximately 5 mm. There is scarring at the right lung base which appears stable. There is no  pneumothorax. No significant pleural effusion. Upper Abdomen: No acute abnormality. 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 Musculoskeletal: There is a new compression deformity of T8 vertebral body, not visualized on prior CT dated 03/18 2020. there is approximately 25% height loss anteriorly. Review of the MIP images confirms the above findings. IMPRESSION: 1. Examination limited by motion artifact. Given this limitation, no definite PE identified on today's exam. 2. Stable bilateral pulmonary nodules. Continued follow-up is recommended as detailed on prior CT. 3. Moderate emphysematous changes bilaterally. Debris  is noted within the trachea. 4. New mild age-indeterminate compression fracture of the T8 vertebral body. This is new since 03/10/2019 CT. Correlation with point tenderness is recommended. Aortic Atherosclerosis (ICD10-I70.0) and Emphysema (ICD10-J43.9). Electronically Signed   By: Constance Holster M.D.   On: 05/28/2019 21:17      Scheduled Meds:  apixaban  5 mg Oral BID   atorvastatin  40 mg Oral QPC supper   azithromycin  250 mg Oral QHS   diltiazem  360 mg Oral Daily   famotidine  20 mg Oral QPC supper   feeding supplement (ENSURE ENLIVE)  237 mL Oral Q24H   ipratropium  0.5 mg Nebulization TID   levalbuterol  0.63 mg Nebulization TID   loratadine  10 mg Oral Daily   methylPREDNISolone (SOLU-MEDROL) injection  60 mg Intravenous Q8H   metoprolol tartrate  25 mg Oral BID   mometasone-formoterol  2 puff Inhalation BID   pantoprazole  40 mg Oral Daily   sertraline  50 mg Oral Daily   tiotropium  18 mcg Inhalation Daily   Continuous Infusions:  sodium chloride 10 mL/hr at 05/29/19 0335     LOS: 1 day    Time spent: 25 minutes   Dessa Phi, DO [facility] [email or web address] 05/30/2019, 10:36 AM

## 2019-05-31 LAB — CBC
HCT: 33.4 % — ABNORMAL LOW (ref 39.0–52.0)
Hemoglobin: 10.1 g/dL — ABNORMAL LOW (ref 13.0–17.0)
MCH: 24.6 pg — ABNORMAL LOW (ref 26.0–34.0)
MCHC: 30.2 g/dL (ref 30.0–36.0)
MCV: 81.5 fL (ref 80.0–100.0)
Platelets: 342 10*3/uL (ref 150–400)
RBC: 4.1 MIL/uL — ABNORMAL LOW (ref 4.22–5.81)
RDW: 14.9 % (ref 11.5–15.5)
WBC: 12.9 10*3/uL — ABNORMAL HIGH (ref 4.0–10.5)
nRBC: 0 % (ref 0.0–0.2)

## 2019-05-31 MED ORDER — MELATONIN 3 MG PO TABS
6.0000 mg | ORAL_TABLET | Freq: Once | ORAL | Status: AC
  Administered 2019-05-31: 6 mg via ORAL
  Filled 2019-05-31: qty 2

## 2019-05-31 MED ORDER — METHYLPREDNISOLONE SODIUM SUCC 125 MG IJ SOLR
60.0000 mg | Freq: Two times a day (BID) | INTRAMUSCULAR | Status: DC
  Administered 2019-05-31 – 2019-06-01 (×2): 60 mg via INTRAVENOUS
  Filled 2019-05-31 (×2): qty 2

## 2019-05-31 NOTE — Progress Notes (Signed)
PROGRESS NOTE    Paul [name]  VZD:[id number] DOB: [birth year] DOA: 05/28/2019 PCP: [name], MD     Brief Narrative:  Paul [name] is a 73 y.o. 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 fib, hyperlipidemia presenting to the hospital from his pulmonologist office for the management of viral pneumonitis.  He is currently being treated with prednisone and dose was increased to 20 mg earlier this week but has not helped. Patient reports 2-day history of worsening shortness of breath and cough productive of thick sputum.  States he has COPD and is chronically on prednisone.  The dose of his prednisone was recently increased but he continues to be symptomatic.  He uses 2 L home oxygen at night and throughout most of the day.    In the emergency department, COVID-19 rapid test negative, chest x-ray revealed mildly hyperexpanded lungs and central interstitial prominence bilaterally indicative of a viral type pneumonitis.  CTA negative for PE.  Showed moderate emphysematous changes bilaterally.  He was admitted for COPD exacerbation and started on IV Solu-Medrol as well as breathing treatments.  New events last 24 hours / Subjective: Not back to baseline, walked down the hall and saturation was 83% once he got back to the room and took some time to improve.   Assessment & Plan:   Principal Problem:   COPD with acute exacerbation ([place]) Active Problems:   Paroxysmal atrial fibrillation (HCC)   Vertebral compression fracture (HCC)   Chronic anemia   Pulmonary nodules   Acute COPD exacerbation with chronic hypoxemic respiratory failure -Wears 2L at baseline -COVID-19 rapid test negative -CT angiogram negative for PE and showing moderate emphysematous changes bilaterally  -Continue IV Solu-Medrol  -Continue Azithromycin -Breathing treatments.  Ordered Dulera as well as Incruse   Vertebral compression fracture  -Imaging showing new mild age-indeterminate compression fracture of the T8 vertebral body. This is new since 03/10/2019 CT -Patient has no pain to palpation over the compression fracture site, has some left-sided musculoskeletal pain -Pain control  Pulmonary nodules -CT showing stable bilateral pulmonary nodules. Patient will need continued outpatient follow-up  Paroxysmal A. Fib -Currently rate controlled.  Continue home Eliquis.  Continue Lopressor and Cardizem.  Depression -Continue Zoloft  GERD -Continue PPI, Pepcid  Anxiety -Continue home Ativan PRN  Hyperlipidemia -Continue Lipitor   DVT prophylaxis: Eliquis Code Status: Full Family Communication: None, patient stated he would update his wife today  Disposition Plan: Pending further improvement in his respiratory status, not back to baseline yet    Consultants:   None  Procedures:   None  Antimicrobials:  Anti-infectives (From admission, onward)   Start     Dose/Rate Route Frequency Ordered Stop   05/29/19 2200  azithromycin (ZITHROMAX) tablet 250 mg     250 mg Oral Daily at bedtime 05/28/19 2320     05/28/19 2330  azithromycin (ZITHROMAX) tablet 500 mg     500 mg Oral  Once 05/28/19 2320 05/28/19 2352       Objective: Vitals:   05/30/19 2146 05/30/19 2155 05/31/19 0539 05/31/19 0843  BP: 135/65 (!) 145/65 128/66   Pulse: 77 83 73   Resp:  18 20   Temp:  97.8 F (36.6 C) 97.9 F (36.6 C)   TempSrc:  Oral Oral   SpO2:  97% 99% 94%  Weight:      Height:        Intake/Output Summary (Last 24 hours)  at 05/31/2019 1151 Last data filed at 05/31/2019 0950 Gross per 24 hour  Intake 300 ml  Output 700 ml  Net -400 ml   Filed Weights   05/29/19 0232  Weight: 61 kg    Examination: General exam: Appears calm and comfortable  Respiratory system: Diminished breath sounds in bilateral bases, no increase in respiratory effort, no conversational dyspnea today Cardiovascular system: S1 & S2 heard,  RRR. No JVD, murmurs, rubs, gallops or clicks. No pedal edema.  Gastrointestinal system: Abdomen is nondistended, soft and nontender. No organomegaly or masses felt. Normal bowel sounds heard. Central nervous system: Alert and oriented. No focal neurological deficits. Extremities: Symmetric 5 x 5 power. Skin: No rashes, lesions or ulcers Psychiatry: Judgement and insight appear normal. Mood & affect appropriate.    Data Reviewed: I have personally reviewed following labs and imaging studies  CBC: Recent Labs  Lab 05/28/19 1724 05/31/19 0344  WBC 9.9 12.9*  NEUTROABS 8.8*  --   HGB 11.9* 10.1*  HCT 39.6 33.4*  MCV 81.8 81.5  PLT 315 093   Basic Metabolic Panel: Recent Labs  Lab 05/28/19 1724  NA 133*  K 4.1  CL 91*  CO2 29  GLUCOSE 167*  BUN 11  CREATININE 0.56*  CALCIUM 8.7*   GFR: Estimated Creatinine Clearance: 69.9 mL/min (A) (by C-G formula based on SCr of 0.56 mg/dL (L)). Liver Function Tests: Recent Labs  Lab 05/28/19 1724  AST 23  ALT 40  ALKPHOS 74  BILITOT 0.2*  PROT 7.4  ALBUMIN 3.7   No results for input(s): LIPASE, AMYLASE in the last 168 hours. No results for input(s): AMMONIA in the last 168 hours. Coagulation Profile: No results for input(s): INR, PROTIME in the last 168 hours. Cardiac Enzymes: No results for input(s): CKTOTAL, CKMB, CKMBINDEX, TROPONINI in the last 168 hours. BNP (last 3 results) No results for input(s): PROBNP in the last 8760 hours. HbA1C: No results for input(s): HGBA1C in the last 72 hours. CBG: No results for input(s): GLUCAP in the last 168 hours. Lipid Profile: No results for input(s): CHOL, HDL, LDLCALC, TRIG, CHOLHDL, LDLDIRECT in the last 72 hours. Thyroid Function Tests: No results for input(s): TSH, T4TOTAL, FREET4, T3FREE, THYROIDAB in the last 72 hours. Anemia Panel: No results for input(s): VITAMINB12, FOLATE, FERRITIN, TIBC, IRON, RETICCTPCT in the last 72 hours. Sepsis Labs: No results for input(s):  PROCALCITON, LATICACIDVEN in the last 168 hours.  Recent Results (from the past 240 hour(s))  SARS Coronavirus 2 (CEPHEID- Performed in [facility] hospital lab), Hosp Order     Status: None   Collection Time: 05/28/19  5:20 PM  Result Value Ref Range Status   SARS Coronavirus 2 NEGATIVE NEGATIVE Final    Comment: (NOTE) If result is NEGATIVE SARS-CoV-2 target nucleic acids are NOT DETECTED. The SARS-CoV-2 RNA is generally detectable in upper and lower  respiratory specimens during the acute phase of infection. The lowest  concentration of SARS-CoV-2 viral copies this assay can detect is 250  copies / mL. A negative result does not preclude SARS-CoV-2 infection  and should not be used as the sole basis for treatment or other  patient management decisions.  A negative result may occur with  improper specimen collection / handling, submission of specimen other  than nasopharyngeal swab, presence of viral mutation(s) within the  areas targeted by this assay, and inadequate number of viral copies  (<250 copies / mL). A negative result must be combined with clinical  observations, patient history,  and epidemiological information. If result is POSITIVE SARS-CoV-2 target nucleic acids are DETECTED. The SARS-CoV-2 RNA is generally detectable in upper and lower  respiratory specimens dur ing the acute phase of infection.  Positive  results are indicative of active infection with SARS-CoV-2.  Clinical  correlation with patient history and other diagnostic information is  necessary to determine patient infection status.  Positive results do  not rule out bacterial infection or co-infection with other viruses. If result is PRESUMPTIVE POSTIVE SARS-CoV-2 nucleic acids MAY BE PRESENT.   A presumptive positive result was obtained on the submitted specimen  and confirmed on repeat testing.  While 2019 novel coronavirus  (SARS-CoV-2) nucleic acids may be present in the submitted sample  additional  confirmatory testing may be necessary for epidemiological  and / or clinical management purposes  to differentiate between  SARS-CoV-2 and other Sarbecovirus currently known to infect humans.  If clinically indicated additional testing with an alternate test  methodology [phone]) is advised. The SARS-CoV-2 RNA is generally  detectable in upper and lower respiratory sp ecimens during the acute  phase of infection. The expected result is Negative. Fact Sheet for Patients:  StrictlyIdeas.no Fact Sheet for Healthcare Providers: BankingDealers.co.za This test is not yet approved or cleared by the [place] FDA and has been authorized for detection and/or diagnosis of SARS-CoV-2 by FDA under an Emergency Use Authorization (EUA).  This EUA will remain in effect (meaning this test can be used) for the duration of the COVID-19 declaration under Section 564(b)(1) of the Act, 21 U.S.C. section 360bbb-3(b)(1), unless the authorization is terminated or revoked sooner. Performed at [facility], [place] [address], [place], [place] [address]        Radiology Studies: No results found.    Scheduled Meds: . apixaban  5 mg Oral BID  . atorvastatin  40 mg Oral QPC supper  . azithromycin  250 mg Oral QHS  . diltiazem  360 mg Oral Daily  . famotidine  20 mg Oral QPC supper  . feeding supplement (ENSURE ENLIVE)  237 mL Oral Q24H  . ipratropium  0.5 mg Nebulization TID  . levalbuterol  0.63 mg Nebulization TID  . loratadine  10 mg Oral Daily  . methylPREDNISolone (SOLU-MEDROL) injection  60 mg Intravenous Q8H  . metoprolol tartrate  25 mg Oral BID  . mometasone-formoterol  2 puff Inhalation BID  . pantoprazole  40 mg Oral Daily  . sertraline  50 mg Oral Daily  . tiotropium  18 mcg Inhalation Daily   Continuous Infusions: . sodium chloride 10 mL/hr at 05/29/19 0335     LOS: 2 days    Time spent: 25 minutes   Dessa Phi, DO [facility] [email or web address] 05/31/2019, 11:51 AM

## 2019-06-01 MED ORDER — METHYLPREDNISOLONE SODIUM SUCC 125 MG IJ SOLR
60.0000 mg | INTRAMUSCULAR | Status: DC
  Administered 2019-06-02: 60 mg via INTRAVENOUS
  Filled 2019-06-01: qty 2

## 2019-06-01 MED ORDER — MELATONIN 3 MG PO TABS
6.0000 mg | ORAL_TABLET | Freq: Every evening | ORAL | Status: DC | PRN
  Administered 2019-06-01 – 2019-06-02 (×2): 6 mg via ORAL
  Filled 2019-06-01 (×4): qty 2

## 2019-06-01 NOTE — Progress Notes (Signed)
PROGRESS NOTE    Paul [name]  ZWC:[id number] DOB: June 03, 1946 DOA: 05/28/2019 PCP: [name], MD     Brief Narrative:  Paul [name] is a 73 y.o. 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 fib, hyperlipidemia presenting to the hospital from his pulmonologist office for the management of viral pneumonitis.  He is currently being treated with prednisone and dose was increased to 20 mg earlier this week but has not helped. Patient reports 2-day history of worsening shortness of breath and cough productive of thick sputum.  States he has COPD and is chronically on prednisone.  The dose of his prednisone was recently increased but he continues to be symptomatic.  He uses 2 L home oxygen at night and throughout most of the day.    In the emergency department, COVID-19 rapid test negative, chest x-ray revealed mildly hyperexpanded lungs and central interstitial prominence bilaterally indicative of a viral type pneumonitis.  CTA negative for PE.  Showed moderate emphysematous changes bilaterally.  He was admitted for COPD exacerbation and started on IV Solu-Medrol as well as breathing treatments.  New events last 24 hours / Subjective: Does not feel that his breathing is back to baseline yet.  Still gets winded with ambulation  Assessment & Plan:   Principal Problem:   COPD with acute exacerbation ([place]) Active Problems:   Paroxysmal atrial fibrillation (HCC)   Vertebral compression fracture (HCC)   Chronic anemia   Pulmonary nodules   Acute COPD exacerbation with chronic hypoxemic respiratory failure -Wears 2L at baseline intermittently -COVID-19 rapid test negative -CT angiogram negative for PE and showing moderate emphysematous changes bilaterally  -Continue IV Solu-Medrol, wean -Continue Azithromycin -Breathing treatments.  Ordered Dulera as well as Incruse   Vertebral compression fracture -Imaging showing  new mild age-indeterminate compression fracture of the T8 vertebral body. This is new since 03/10/2019 CT -Patient has no pain to palpation over the compression fracture site, has some left-sided musculoskeletal pain -Pain control  Pulmonary nodules -CT showing stable bilateral pulmonary nodules. Patient will need continued outpatient follow-up  Paroxysmal A. Fib -Currently rate controlled.  Continue home Eliquis.  Continue Lopressor and Cardizem.  Depression -Continue Zoloft  GERD -Continue PPI, Pepcid  Anxiety -Continue home Ativan PRN  Hyperlipidemia -Continue Lipitor  Leukocytosis -Likely in response to steroid use   DVT prophylaxis: Eliquis Code Status: Full Family Communication: None, patient stated he would update his wife today   Disposition Plan: Pending further improvement in his respiratory status, not back to baseline yet.  Hopeful discharge home 6/10   Consultants:   None  Procedures:   None  Antimicrobials:  Anti-infectives (From admission, onward)   Start     Dose/Rate Route Frequency Ordered Stop   05/29/19 2200  azithromycin (ZITHROMAX) tablet 250 mg     250 mg Oral Daily at bedtime 05/28/19 2320     05/28/19 2330  azithromycin (ZITHROMAX) tablet 500 mg     500 mg Oral  Once 05/28/19 2320 05/28/19 2352       Objective: Vitals:   05/31/19 2008 05/31/19 2149 06/01/19 0518 06/01/19 0847  BP:  (!) 150/63 135/76   Pulse:  82 75   Resp:  20 20   Temp:  98.2 F (36.8 C) (!) 97.5 F (36.4 C)   TempSrc:  Oral Oral   SpO2: 93% 90% 100% 95%  Weight:      Height:  Intake/Output Summary (Last 24 hours) at 06/01/2019 1059 Last data filed at 06/01/2019 1000 Gross per 24 hour  Intake 240 ml  Output 1025 ml  Net -785 ml   Filed Weights   05/29/19 0232  Weight: 61 kg    Examination: General exam: Appears calm and comfortable  Respiratory system: Bilateral bases diminished breath sounds without any increase in respiratory effort  today.  No wheeze. Cardiovascular system: S1 & S2 heard, RRR. No JVD, murmurs, rubs, gallops or clicks. No pedal edema. Gastrointestinal system: Abdomen is nondistended, soft and nontender. No organomegaly or masses felt. Normal bowel sounds heard. Central nervous system: Alert and oriented. No focal neurological deficits. Extremities: Symmetric 5 x 5 power. Skin: No rashes, lesions or ulcers Psychiatry: Judgement and insight appear normal. Mood & affect appropriate.    Data Reviewed: I have personally reviewed following labs and imaging studies  CBC: Recent Labs  Lab 05/28/19 1724 05/31/19 0344  WBC 9.9 12.9*  NEUTROABS 8.8*  --   HGB 11.9* 10.1*  HCT 39.6 33.4*  MCV 81.8 81.5  PLT 315 709   Basic Metabolic Panel: Recent Labs  Lab 05/28/19 1724  NA 133*  K 4.1  CL 91*  CO2 29  GLUCOSE 167*  BUN 11  CREATININE 0.56*  CALCIUM 8.7*   GFR: Estimated Creatinine Clearance: 69.9 mL/min (A) (by C-G formula based on SCr of 0.56 mg/dL (L)). Liver Function Tests: Recent Labs  Lab 05/28/19 1724  AST 23  ALT 40  ALKPHOS 74  BILITOT 0.2*  PROT 7.4  ALBUMIN 3.7   No results for input(s): LIPASE, AMYLASE in the last 168 hours. No results for input(s): AMMONIA in the last 168 hours. Coagulation Profile: No results for input(s): INR, PROTIME in the last 168 hours. Cardiac Enzymes: No results for input(s): CKTOTAL, CKMB, CKMBINDEX, TROPONINI in the last 168 hours. BNP (last 3 results) No results for input(s): PROBNP in the last 8760 hours. HbA1C: No results for input(s): HGBA1C in the last 72 hours. CBG: No results for input(s): GLUCAP in the last 168 hours. Lipid Profile: No results for input(s): CHOL, HDL, LDLCALC, TRIG, CHOLHDL, LDLDIRECT in the last 72 hours. Thyroid Function Tests: No results for input(s): TSH, T4TOTAL, FREET4, T3FREE, THYROIDAB in the last 72 hours. Anemia Panel: No results for input(s): VITAMINB12, FOLATE, FERRITIN, TIBC, IRON, RETICCTPCT in the  last 72 hours. Sepsis Labs: No results for input(s): PROCALCITON, LATICACIDVEN in the last 168 hours.  Recent Results (from the past 240 hour(s))  SARS Coronavirus 2 (CEPHEID- Performed in [facility] hospital lab), Hosp Order     Status: None   Collection Time: 05/28/19  5:20 PM  Result Value Ref Range Status   SARS Coronavirus 2 NEGATIVE NEGATIVE Final    Comment: (NOTE) If result is NEGATIVE SARS-CoV-2 target nucleic acids are NOT DETECTED. The SARS-CoV-2 RNA is generally detectable in upper and lower  respiratory specimens during the acute phase of infection. The lowest  concentration of SARS-CoV-2 viral copies this assay can detect is 250  copies / mL. A negative result does not preclude SARS-CoV-2 infection  and should not be used as the sole basis for treatment or other  patient management decisions.  A negative result may occur with  improper specimen collection / handling, submission of specimen other  than nasopharyngeal swab, presence of viral mutation(s) within the  areas targeted by this assay, and inadequate number of viral copies  (<250 copies / mL). A negative result must be combined with clinical  observations, patient history, and epidemiological information. If result is POSITIVE SARS-CoV-2 target nucleic acids are DETECTED. The SARS-CoV-2 RNA is generally detectable in upper and lower  respiratory specimens dur ing the acute phase of infection.  Positive  results are indicative of active infection with SARS-CoV-2.  Clinical  correlation with patient history and other diagnostic information is  necessary to determine patient infection status.  Positive results do  not rule out bacterial infection or co-infection with other viruses. If result is PRESUMPTIVE POSTIVE SARS-CoV-2 nucleic acids MAY BE PRESENT.   A presumptive positive result was obtained on the submitted specimen  and confirmed on repeat testing.  While 2019 novel coronavirus  (SARS-CoV-2) nucleic  acids may be present in the submitted sample  additional confirmatory testing may be necessary for epidemiological  and / or clinical management purposes  to differentiate between  SARS-CoV-2 and other Sarbecovirus currently known to infect humans.  If clinically indicated additional testing with an alternate test  methodology [phone]) is advised. The SARS-CoV-2 RNA is generally  detectable in upper and lower respiratory sp ecimens during the acute  phase of infection. The expected result is Negative. Fact Sheet for Patients:  StrictlyIdeas.no Fact Sheet for Healthcare Providers: BankingDealers.co.za This test is not yet approved or cleared by the [place] FDA and has been authorized for detection and/or diagnosis of SARS-CoV-2 by FDA under an Emergency Use Authorization (EUA).  This EUA will remain in effect (meaning this test can be used) for the duration of the COVID-19 declaration under Section 564(b)(1) of the Act, 21 U.S.C. section 360bbb-3(b)(1), unless the authorization is terminated or revoked sooner. Performed at [facility], [place] [address], [place], [place] [address]        Radiology Studies: No results found.    Scheduled Meds: . apixaban  5 mg Oral BID  . atorvastatin  40 mg Oral QPC supper  . azithromycin  250 mg Oral QHS  . diltiazem  360 mg Oral Daily  . famotidine  20 mg Oral QPC supper  . feeding supplement (ENSURE ENLIVE)  237 mL Oral Q24H  . levalbuterol  0.63 mg Nebulization TID  . loratadine  10 mg Oral Daily  . methylPREDNISolone (SOLU-MEDROL) injection  60 mg Intravenous Q12H  . metoprolol tartrate  25 mg Oral BID  . mometasone-formoterol  2 puff Inhalation BID  . pantoprazole  40 mg Oral Daily  . sertraline  50 mg Oral Daily  . tiotropium  18 mcg Inhalation Daily   Continuous Infusions: . sodium chloride 10 mL/hr at 05/29/19 0335     LOS: 3 days    Time spent: 25  minutes   Dessa Phi, DO [facility] [email or web address] 06/01/2019, 10:59 AM

## 2019-06-01 NOTE — Care Management Important Message (Signed)
Important Message  Patient Details IM Letter given to Paul Marus RN to present to the Patient Name: Paul [name] MRN: [id number] Date of Birth: 1946/05/12   Medicare Important Message Given:  Yes    Kerin Salen 06/01/2019, 9:57 AM

## 2019-06-02 DIAGNOSIS — R918 Other nonspecific abnormal finding of lung field: Secondary | ICD-10-CM

## 2019-06-02 DIAGNOSIS — I480 Paroxysmal atrial fibrillation: Secondary | ICD-10-CM

## 2019-06-02 LAB — CBC
HCT: 37.6 % — ABNORMAL LOW (ref 39.0–52.0)
Hemoglobin: 11.5 g/dL — ABNORMAL LOW (ref 13.0–17.0)
MCH: 25.2 pg — ABNORMAL LOW (ref 26.0–34.0)
MCHC: 30.6 g/dL (ref 30.0–36.0)
MCV: 82.3 fL (ref 80.0–100.0)
Platelets: 339 10*3/uL (ref 150–400)
RBC: 4.57 MIL/uL (ref 4.22–5.81)
RDW: 14.8 % (ref 11.5–15.5)
WBC: 11.8 10*3/uL — ABNORMAL HIGH (ref 4.0–10.5)
nRBC: 0 % (ref 0.0–0.2)

## 2019-06-02 MED ORDER — PREDNISONE 20 MG PO TABS
40.0000 mg | ORAL_TABLET | Freq: Every day | ORAL | Status: DC
  Administered 2019-06-03: 40 mg via ORAL
  Filled 2019-06-02: qty 2

## 2019-06-02 NOTE — Progress Notes (Signed)
PROGRESS NOTE    Paul [name]  GXQ:[id number] DOB: 10/04/1946 DOA: 05/28/2019 PCP: [name], MD     Brief Narrative:  Paul [name] is a 73 y.o. 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 fib, hyperlipidemia presenting to the hospital from his pulmonologist office for the management of viral pneumonitis.  He is currently being treated with prednisone and dose was increased to 20 mg earlier this week but has not helped. Patient reports 2-day history of worsening shortness of breath and cough productive of thick sputum.  States he has COPD and is chronically on prednisone.  The dose of his prednisone was recently increased but he continues to be symptomatic.  He uses 2 L home oxygen at night and throughout most of the day.    In the emergency department, COVID-19 rapid test negative, chest x-ray revealed mildly hyperexpanded lungs and central interstitial prominence bilaterally indicative of a viral type pneumonitis.  CTA negative for PE.  Showed moderate emphysematous changes bilaterally.  He was admitted for COPD exacerbation and started on IV Solu-Medrol as well as breathing treatments.  New events last 24 hours / Subjective: Patient reports breathing is slightly improving, but not back to baseline.  He will noted to desaturate on ambulation. Denies any new symptoms.  Assessment & Plan:   Principal Problem:   COPD with acute exacerbation ([place]) Active Problems:   Paroxysmal atrial fibrillation ([facility])   Vertebral compression fracture ([facility])   Chronic anemia   Pulmonary nodules   Acute COPD exacerbation with chronic hypoxemic respiratory failure Afebrile with leukocytosis (on steroids) -Wears 2L at baseline intermittently (reports mostly at night) -COVID-19 rapid test negative -CT angiogram negative for PE and showing moderate emphysematous changes bilaterally  -Continue tapering dose of IV Solu-Medrol,  Azithromycin -Continue duo nebs, inhalers  Vertebral compression fracture -Imaging showing new mild age-indeterminate compression fracture of the T8 vertebral body. This is new since 03/10/2019 CT -Patient has no pain to palpation over the compression fracture site, has some left-sided musculoskeletal pain -Pain control  Pulmonary nodules -CT showing stable bilateral pulmonary nodules. Patient will need continued outpatient follow-up  Paroxysmal A. Fib -Currently rate controlled.  Continue home Eliquis.  Continue Lopressor and Cardizem.  Depression -Continue Zoloft  GERD -Continue PPI, Pepcid  Anxiety -Continue home Ativan PRN  Hyperlipidemia -Continue Lipitor   DVT prophylaxis: Eliquis Code Status: Full Family Communication: None at bedside Disposition Plan: Plan to discharge home 6/11   Consultants:   None  Procedures:   None  Antimicrobials:  Anti-infectives (From admission, onward)   Start     Dose/Rate Route Frequency Ordered Stop   05/29/19 2200  azithromycin (ZITHROMAX) tablet 250 mg     250 mg Oral Daily at bedtime 05/28/19 2320     05/28/19 2330  azithromycin (ZITHROMAX) tablet 500 mg     500 mg Oral  Once 05/28/19 2320 05/28/19 2352       Objective: Vitals:   06/02/19 0559 06/02/19 0838 06/02/19 1248 06/02/19 1432  BP: (!) 141/76  129/61   Pulse: 66 78 73   Resp: 20 16 18    Temp: 98.2 F (36.8 C)  98.1 F (36.7 C)   TempSrc: Oral  Oral   SpO2: 99% 92% 97% 94%  Weight:      Height:        Intake/Output Summary (Last 24 hours) at 06/02/2019 1655 Last data filed at 06/02/2019 1300 Gross per 24 hour  Intake 1200 ml  Output 2400 ml  Net -1200 ml   Filed Weights   05/29/19 0232  Weight: 61 kg    Examination:  General: NAD   Cardiovascular: S1, S2 present  Respiratory:  Bibasilar diminished breath sounds, no wheezing noted  Abdomen: Soft, nontender, nondistended, bowel sounds present  Musculoskeletal: No bilateral pedal  edema noted  Skin: Normal  Psychiatry: Normal mood    Data Reviewed: I have personally reviewed following labs and imaging studies  CBC: Recent Labs  Lab 05/28/19 1724 05/31/19 0344 06/02/19 0604  WBC 9.9 12.9* 11.8*  NEUTROABS 8.8*  --   --   HGB 11.9* 10.1* 11.5*  HCT 39.6 33.4* 37.6*  MCV 81.8 81.5 82.3  PLT 315 342 379   Basic Metabolic Panel: Recent Labs  Lab 05/28/19 1724  NA 133*  K 4.1  CL 91*  CO2 29  GLUCOSE 167*  BUN 11  CREATININE 0.56*  CALCIUM 8.7*   GFR: Estimated Creatinine Clearance: 69.9 mL/min (A) (by C-G formula based on SCr of 0.56 mg/dL (L)). Liver Function Tests: Recent Labs  Lab 05/28/19 1724  AST 23  ALT 40  ALKPHOS 74  BILITOT 0.2*  PROT 7.4  ALBUMIN 3.7   No results for input(s): LIPASE, AMYLASE in the last 168 hours. No results for input(s): AMMONIA in the last 168 hours. Coagulation Profile: No results for input(s): INR, PROTIME in the last 168 hours. Cardiac Enzymes: No results for input(s): CKTOTAL, CKMB, CKMBINDEX, TROPONINI in the last 168 hours. BNP (last 3 results) No results for input(s): PROBNP in the last 8760 hours. HbA1C: No results for input(s): HGBA1C in the last 72 hours. CBG: No results for input(s): GLUCAP in the last 168 hours. Lipid Profile: No results for input(s): CHOL, HDL, LDLCALC, TRIG, CHOLHDL, LDLDIRECT in the last 72 hours. Thyroid Function Tests: No results for input(s): TSH, T4TOTAL, FREET4, T3FREE, THYROIDAB in the last 72 hours. Anemia Panel: No results for input(s): VITAMINB12, FOLATE, FERRITIN, TIBC, IRON, RETICCTPCT in the last 72 hours. Sepsis Labs: No results for input(s): PROCALCITON, LATICACIDVEN in the last 168 hours.  Recent Results (from the past 240 hour(s))  SARS Coronavirus 2 (CEPHEID- Performed in [facility] lab), Hosp Order     Status: None   Collection Time: 05/28/19  5:20 PM  Result Value Ref Range Status   SARS Coronavirus 2 NEGATIVE NEGATIVE Final     Comment: (NOTE) If result is NEGATIVE SARS-CoV-2 target nucleic acids are NOT DETECTED. The SARS-CoV-2 RNA is generally detectable in upper and lower  respiratory specimens during the acute phase of infection. The lowest  concentration of SARS-CoV-2 viral copies this assay can detect is 250  copies / mL. A negative result does not preclude SARS-CoV-2 infection  and should not be used as the sole basis for treatment or other  patient management decisions.  A negative result may occur with  improper specimen collection / handling, submission of specimen other  than nasopharyngeal swab, presence of viral mutation(s) within the  areas targeted by this assay, and inadequate number of viral copies  (<250 copies / mL). A negative result must be combined with clinical  observations, patient history, and epidemiological information. If result is POSITIVE SARS-CoV-2 target nucleic acids are DETECTED. The SARS-CoV-2 RNA is generally detectable in upper and lower  respiratory specimens dur ing the acute phase of infection.  Positive  results are indicative of active infection with SARS-CoV-2.  Clinical  correlation with patient history and other diagnostic  information is  necessary to determine patient infection status.  Positive results do  not rule out bacterial infection or co-infection with other viruses. If result is PRESUMPTIVE POSTIVE SARS-CoV-2 nucleic acids MAY BE PRESENT.   A presumptive positive result was obtained on the submitted specimen  and confirmed on repeat testing.  While 2019 novel coronavirus  (SARS-CoV-2) nucleic acids may be present in the submitted sample  additional confirmatory testing may be necessary for epidemiological  and / or clinical management purposes  to differentiate between  SARS-CoV-2 and other Sarbecovirus currently known to infect humans.  If clinically indicated additional testing with an alternate test  methodology [phone]) is advised. The SARS-CoV-2  RNA is generally  detectable in upper and lower respiratory sp ecimens during the acute  phase of infection. The expected result is Negative. Fact Sheet for Patients:  StrictlyIdeas.no Fact Sheet for Healthcare Providers: BankingDealers.co.za This test is not yet approved or cleared by the [place] FDA and has been authorized for detection and/or diagnosis of SARS-CoV-2 by FDA under an Emergency Use Authorization (EUA).  This EUA will remain in effect (meaning this test can be used) for the duration of the COVID-19 declaration under Section 564(b)(1) of the Act, 21 U.S.C. section 360bbb-3(b)(1), unless the authorization is terminated or revoked sooner. Performed at [facility], [place] [address], [place], [place] [address]        Radiology Studies: No results found.    Scheduled Meds: . apixaban  5 mg Oral BID  . atorvastatin  40 mg Oral QPC supper  . azithromycin  250 mg Oral QHS  . diltiazem  360 mg Oral Daily  . famotidine  20 mg Oral QPC supper  . feeding supplement (ENSURE ENLIVE)  237 mL Oral Q24H  . levalbuterol  0.63 mg Nebulization TID  . loratadine  10 mg Oral Daily  . methylPREDNISolone (SOLU-MEDROL) injection  60 mg Intravenous Q24H  . metoprolol tartrate  25 mg Oral BID  . mometasone-formoterol  2 puff Inhalation BID  . pantoprazole  40 mg Oral Daily  . sertraline  50 mg Oral Daily  . tiotropium  18 mcg Inhalation Daily   Continuous Infusions: . sodium chloride 10 mL/hr at 05/29/19 0335     LOS: 4 days    Time spent: 25 minutes   Alma [facility], MD [facility] [email or web address] 06/02/2019, 4:55 PM

## 2019-06-02 NOTE — Progress Notes (Signed)
Chart and office note reviewed in detail  > agree with a/p as outlined    

## 2019-06-03 DIAGNOSIS — D649 Anemia, unspecified: Secondary | ICD-10-CM

## 2019-06-03 MED ORDER — PREDNISONE 10 MG PO TABS: ORAL_TABLET | ORAL | 0 refills | Status: DC

## 2019-06-03 NOTE — Progress Notes (Signed)
Pt discharged to home instruction reviewed with patient. Pt acknowledged understanding of instructions. Pt has Home O2 and demonstrated use of equipment and checking o2 sat with personal portable pulse ox. Pt prescriptions sent to pharmacy and acknowledges to pick up at pharmacy. Pt son available to transport pt home. SRP, RN

## 2019-06-03 NOTE — Discharge Summary (Signed)
Discharge Summary  Paul [name] GNO:[id number] DOB: 1946-10-13  PCP: [name], MD  Admit date: 05/28/2019 Discharge date: 06/03/2019  Time spent: 35 mins  Recommendations for Outpatient Follow-up:  1. PCP  Discharge Diagnoses:  Active Hospital Problems   Diagnosis Date Noted  . COPD with acute exacerbation ([place]) 05/28/2019  . Vertebral compression fracture ([place]) 05/29/2019  . Chronic anemia 05/29/2019  . Pulmonary nodules 05/29/2019  . Paroxysmal atrial fibrillation [facility])     Resolved Hospital Problems  No resolved problems to display.    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 fib, hyperlipidemia presenting to the hospital from his pulmonologist office for the management of viral pneumonitis. He is currently being treated with prednisone and dose was increased to 20 mg earlier this week but has not helped. Patient reports 2-day history of worsening shortness of breath and cough productive of thick sputum. States he has COPD and is chronically on prednisone. The dose of his prednisone was recently increased but he continues to be symptomatic. He uses 2 L home oxygen at night and throughout most of the day.   In the emergency department, COVID-19 rapid test negative, chest x-ray revealed mildly hyperexpanded lungs and central interstitial prominence bilaterally indicative of a viral type pneumonitis.  CTA negative for PE.  Showed moderate emphysematous changes bilaterally.  He was admitted for COPD exacerbation and started on IV Solu-Medrol as well as breathing treatments.  Today, patient reported feeling much better, currently back to his  baseline.  New symptoms.  Stable to be discharged home with close follow-up with PCP  Hospital Course:  Principal Problem:   COPD with acute exacerbation ([place]) Active Problems:   Paroxysmal atrial fibrillation ([facility])   Vertebral compression fracture ([facility])   Chronic anemia   Pulmonary nodules  Acute COPD exacerbation with chronic hypoxemic respiratory failure Improved Afebrile with leukocytosis (on steroids) -Wears 2L at baseline intermittently (reports mostly at night) -COVID-19 rapid test negative -CT angiogram negative for PE and showing moderate emphysematous changes bilaterally  -Continue tapering dose of prednisone over 12 days -Completed dose of azithromycin -Continue home duo nebs, inhalers  Vertebral compression fracture -Imaging showing new mild age-indeterminate compression fracture of the T8 vertebral body. This is new since 03/10/2019 CT -Patient has no pain to palpation over the compression fracture site, has some left-sided musculoskeletal pain -Pain control and PCP follow-up  Pulmonary nodules -CT showing stable bilateral pulmonary nodules. Patient will need continued outpatient follow-up  Paroxysmal A. Fib -Currently rate controlled. Continue home Eliquis.Continue Lopressor and Cardizem.  Depression -Continue Zoloft  GERD -Continue PPI, Pepcid  Anxiety -Continue home Ativan PRN  Hyperlipidemia -Continue Lipitor           Malnutrition Type:  Nutrition Problem: Increased nutrient needs Etiology: acute illness, chronic illness   Malnutrition Characteristics:  Signs/Symptoms: estimated needs   Nutrition Interventions:  Interventions: Ensure Enlive (each supplement provides 350kcal and 20 grams of protein)   Estimated body mass index is 21.71 kg/m as calculated from the following:   Height as of this encounter: 5\' 6"  (1.676 m).   Weight as of this encounter: 61 kg.  Procedures:  None  Consultations:  None  Discharge  Exam: BP (!) 141/57   Pulse 76   Temp 98.3 F (36.8 C) (Oral)   Resp 20   Ht 5\' 6"  (1.676 m)   Wt 61 kg   SpO2 94%   BMI 21.71 kg/m   General: NAD Cardiovascular: S1, S2 present Respiratory: Bibasilar diminished breath sounds, no wheezing noted.  Discharge Instructions You were cared for by a hospitalist during your hospital stay. If you have any questions about your discharge medications or the care you received while you were in the hospital after you are discharged, you can call the unit and asked to speak with the hospitalist on call if the hospitalist that took care of you is not available. Once you are discharged, your primary care physician will handle any further medical issues. Please note that NO REFILLS for any discharge medications will be authorized once you are discharged, as it is imperative that you return to your primary care physician (or establish a relationship with a primary care physician if you do not have one) for your aftercare needs so that they can reassess your need for medications and monitor your lab values.   Allergies as of 06/03/2019   No Known Allergies     Medication List    STOP taking these medications   azithromycin 250 MG tablet Commonly known as: ZITHROMAX     TAKE these medications   ProAir HFA 108 (90 Base) MCG/ACT inhaler Generic drug: albuterol Inhale 2 puffs into the lungs every 6 (six) hours as needed for wheezing or shortness of breath.   albuterol (2.5 MG/3ML) 0.083% nebulizer solution Commonly known as: PROVENTIL Take 3 mLs (2.5 mg total) by nebulization every 6 (six) hours as needed for wheezing or shortness of breath.   apixaban 5 MG Tabs tablet Commonly known as: ELIQUIS Take 1 tablet (5 mg total) by mouth 2 (two) times daily.   atorvastatin 40 MG tablet Commonly known as: LIPITOR Take 1 tablet (40 mg total) by mouth daily after supper.   budesonide-formoterol 160-4.5 MCG/ACT inhaler Commonly known as: Symbicort  Inhale 2 puffs into the lungs 2 (two) times a day. What changed: Another medication with the same name was removed. Continue taking this medication, and follow the directions you see here.   diltiazem 360 MG 24 hr capsule Commonly known as: CARDIZEM CD Take 1 capsule (360 mg total) by mouth daily.   doxylamine (Sleep) 25 MG tablet Commonly known as: UNISOM Take 25 mg by mouth at bedtime as needed for sleep.   famotidine 20 MG tablet Commonly known as: Pepcid One hour before  bedtime What changed:   how much to take  how to take this  when to take this  additional instructions   Flutter Devi Use as directed What changed:   when to take this  additional instructions   furosemide 20 MG tablet Commonly known as: LASIX Take 1 tablet (20 mg total) by mouth daily.   loratadine 10 MG tablet Commonly known as: CLARITIN Take 10 mg by mouth daily.   LORazepam 0.5 MG tablet Commonly known as: ATIVAN Take 0.5 mg by mouth daily as needed for anxiety.   metoprolol tartrate 25 MG tablet Commonly known as: LOPRESSOR Take 1 tablet (25 mg total) by mouth 2 (two) times daily.   Mucinex Fast-Max 10-650-400 MG/20ML Liqd Generic drug: Phenylephrine-APAP-guaiFENesin Take 15 mLs by mouth as needed (to loosen phlegm).   OXYGEN Inhale 2 L into the lungs  continuous.   pantoprazole 40 MG tablet Commonly known as: Protonix Take 1 tablet (40 mg total) by mouth daily. Take 30-60 min before first meal of the day   predniSONE 10 MG tablet Commonly known as: DELTASONE Take 4 tabs daily for 3 days, then 3 tabs daily for 3 days, then 2 tabs daily for 3 days, then 1 tab daily for 3 days What changed:   medication strength  See the new instructions.   sertraline 50 MG tablet Commonly known as: ZOLOFT Take 50 mg by mouth daily.   Tiotropium Bromide Monohydrate 2.5 MCG/ACT Aers Commonly known as: Spiriva Respimat Inhale 2 puffs into the lungs daily. What changed: Another medication  with the same name was removed. Continue taking this medication, and follow the directions you see here.      No Known Allergies Follow-up Information    Cox, Kirsten, MD. Schedule an appointment as soon as possible for a visit.   Specialties: Family Medicine, Interventional Cardiology, Radiology, Anesthesiology Contact information: [address] [place] [address] [phone]        Jerline Pain, MD .   Specialty: Cardiology Contact information: [phone] [address]. [address] [place] [address] [phone]            The results of significant diagnostics from this hospitalization (including imaging, microbiology, ancillary and laboratory) are listed below for reference.    Significant Diagnostic Studies: Dg Chest 2 View  Result Date: 05/28/2019 CLINICAL DATA:  Shortness of breath EXAM: CHEST - 2 VIEW COMPARISON:  May 03, 2019 and March 10, 2019 FINDINGS: Lungs are mildly hyperexpanded. There is interstitial thickening bilaterally, a finding not present on recent prior study. There is no consolidation or volume loss. Heart size and pulmonary vascularity are normal. No adenopathy. There is aortic atherosclerosis. Slight anterior wedging in several thoracic vertebral bodies is stable. IMPRESSION: Lungs mildly hyperexpanded. Central interstitial prominence bilaterally, a finding that potentially may be indicative of a viral type pneumonitis. No consolidation or volume loss evident. No adenopathy. Heart size normal. Aortic Atherosclerosis (ICD10-I70.0). Electronically Signed   By: Lowella Grip III M.D.   On: 05/28/2019 16:11   Ct Angio Chest Pe W/cm &/or Wo Cm  Result Date: 05/28/2019 CLINICAL DATA:  Pneumonia.  Concern for PE. EXAM: CT ANGIOGRAPHY CHEST WITH CONTRAST TECHNIQUE: Multidetector CT imaging of the chest was performed using the standard protocol during bolus administration of intravenous contrast. Multiplanar CT image reconstructions and MIPs  were obtained to evaluate the vascular anatomy. CONTRAST:  175mL OMNIPAQUE IOHEXOL 350 MG/ML SOLN COMPARISON:  CT dated 03/10/2019 FINDINGS: Cardiovascular: Evaluation for pulmonary emboli is limited by motion artifact. Given this limitation, there is no definite PE. The heart size is normal. There are some mild aortic calcifications and coronary artery calcifications. Mediastinum/Nodes: No enlarged mediastinal, hilar, or axillary lymph nodes. Thyroid gland, trachea, and esophagus demonstrate no significant findings. Lungs/Pleura: There is calcified pleuroparenchymal scarring at the lung apices. There are calcified pulmonary nodules bilaterally. There are moderate emphysematous changes bilaterally. There is debris within the trachea. There is a stable pulmonary nodule in the left upper lobe measuring approximately 5 mm. There is scarring at the right lung base which appears stable. There is no pneumothorax. No significant pleural effusion. Upper Abdomen: No acute abnormality. 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 Musculoskeletal: There is a new compression deformity of T8 vertebral body, not visualized on prior CT dated 03/18 2020.  there is approximately 25% height loss anteriorly. Review of the MIP images confirms the above findings. IMPRESSION: 1. Examination limited by motion artifact. Given this limitation, no definite PE identified on today's exam. 2. Stable bilateral pulmonary nodules. Continued follow-up is recommended as detailed on prior CT. 3. Moderate emphysematous changes bilaterally. Debris is noted within the trachea. 4. New mild age-indeterminate compression fracture of the T8 vertebral body. This is new since 03/10/2019 CT. Correlation with point tenderness is recommended. Aortic Atherosclerosis (ICD10-I70.0) and Emphysema (ICD10-J43.9). Electronically Signed   By: Constance Holster M.D.   On: 05/28/2019 21:17    Microbiology: Recent  Results (from the past 240 hour(s))  SARS Coronavirus 2 (CEPHEID- Performed in [place] hospital lab), Hosp Order     Status: None   Collection Time: 05/28/19  5:20 PM   Specimen: Nasopharyngeal Swab  Result Value Ref Range Status   SARS Coronavirus 2 NEGATIVE NEGATIVE Final    Comment: (NOTE) If result is NEGATIVE SARS-CoV-2 target nucleic acids are NOT DETECTED. The SARS-CoV-2 RNA is generally detectable in upper and lower  respiratory specimens during the acute phase of infection. The lowest  concentration of SARS-CoV-2 viral copies this assay can detect is 250  copies / mL. A negative result does not preclude SARS-CoV-2 infection  and should not be used as the sole basis for treatment or other  patient management decisions.  A negative result may occur with  improper specimen collection / handling, submission of specimen other  than nasopharyngeal swab, presence of viral mutation(s) within the  areas targeted by this assay, and inadequate number of viral copies  (<250 copies / mL). A negative result must be combined with clinical  observations, patient history, and epidemiological information. If result is POSITIVE SARS-CoV-2 target nucleic acids are DETECTED. The SARS-CoV-2 RNA is generally detectable in upper and lower  respiratory specimens dur ing the acute phase of infection.  Positive  results are indicative of active infection with SARS-CoV-2.  Clinical  correlation with patient history and other diagnostic information is  necessary to determine patient infection status.  Positive results do  not rule out bacterial infection or co-infection with other viruses. If result is PRESUMPTIVE POSTIVE SARS-CoV-2 nucleic acids MAY BE PRESENT.   A presumptive positive result was obtained on the submitted specimen  and confirmed on repeat testing.  While 2019 novel coronavirus  (SARS-CoV-2) nucleic acids may be present in the submitted sample  additional confirmatory testing may  be necessary for epidemiological  and / or clinical management purposes  to differentiate between  SARS-CoV-2 and other Sarbecovirus currently known to infect humans.  If clinically indicated additional testing with an alternate test  methodology [phone]) is advised. The SARS-CoV-2 RNA is generally  detectable in upper and lower respiratory sp ecimens during the acute  phase of infection. The expected result is Negative. Fact Sheet for Patients:  StrictlyIdeas.no Fact Sheet for Healthcare Providers: BankingDealers.co.za This test is not yet approved or cleared by the [place] FDA and has been authorized for detection and/or diagnosis of SARS-CoV-2 by FDA under an Emergency Use Authorization (EUA).  This EUA will remain in effect (meaning this test can be used) for the duration of the COVID-19 declaration under Section 564(b)(1) of the Act, 21 U.S.C. section 360bbb-3(b)(1), unless the authorization is terminated or revoked sooner. Performed at [facility], [place] [address] [place], [place] [address]      Labs: Basic Metabolic Panel: Recent Labs  Lab 05/28/19 1724  NA  133*  K 4.1  CL 91*  CO2 29  GLUCOSE 167*  BUN 11  CREATININE 0.56*  CALCIUM 8.7*   Liver Function Tests: Recent Labs  Lab 05/28/19 1724  AST 23  ALT 40  ALKPHOS 74  BILITOT 0.2*  PROT 7.4  ALBUMIN 3.7   No results for input(s): LIPASE, AMYLASE in the last 168 hours. No results for input(s): AMMONIA in the last 168 hours. CBC: Recent Labs  Lab 05/28/19 1724 05/31/19 0344 06/02/19 0604  WBC 9.9 12.9* 11.8*  NEUTROABS 8.8*  --   --   HGB 11.9* 10.1* 11.5*  HCT 39.6 33.4* 37.6*  MCV 81.8 81.5 82.3  PLT 315 342 339   Cardiac Enzymes: No results for input(s): CKTOTAL, CKMB, CKMBINDEX, TROPONINI in the last 168 hours. BNP: BNP (last 3 results) Recent Labs    04/14/19 2107 05/03/19 1324 05/28/19 1724  BNP 90.4 58.4 56.6     ProBNP (last 3 results) No results for input(s): PROBNP in the last 8760 hours.  CBG: No results for input(s): GLUCAP in the last 168 hours.     Signed:  Alma Friendly, MD [facility] 06/03/2019, 10:41 AM

## 2019-06-04 ENCOUNTER — Other Ambulatory Visit: Payer: Self-pay

## 2019-06-04 DIAGNOSIS — J441 Chronic obstructive pulmonary disease with (acute) exacerbation: Secondary | ICD-10-CM

## 2019-06-04 DIAGNOSIS — S81802A Unspecified open wound, left lower leg, initial encounter: Secondary | ICD-10-CM | POA: Diagnosis not present

## 2019-06-07 ENCOUNTER — Other Ambulatory Visit: Payer: Self-pay

## 2019-06-07 DIAGNOSIS — D638 Anemia in other chronic diseases classified elsewhere: Secondary | ICD-10-CM | POA: Diagnosis not present

## 2019-06-07 DIAGNOSIS — S80812A Abrasion, left lower leg, initial encounter: Secondary | ICD-10-CM | POA: Diagnosis not present

## 2019-06-07 DIAGNOSIS — I4819 Other persistent atrial fibrillation: Secondary | ICD-10-CM | POA: Diagnosis not present

## 2019-06-07 DIAGNOSIS — J441 Chronic obstructive pulmonary disease with (acute) exacerbation: Secondary | ICD-10-CM | POA: Diagnosis not present

## 2019-06-07 DIAGNOSIS — S22060A Wedge compression fracture of T7-T8 vertebra, initial encounter for closed fracture: Secondary | ICD-10-CM | POA: Diagnosis not present

## 2019-06-07 NOTE — Patient Outreach (Signed)
Transition of care: Discharged from [facility] on 06/04/2019.   Dr. Dirk Dress is primary MD.  Placed call to patient who answered the phone and identified himself. Reviewed reason for call.  Reports he is doing well since hospital discharge on 06/04/2019.  Reports only needing to wearing oxygen at night. Reports he has all his medications and is taking them as prescribed. Reports that he has follow up planned with primary MD later today. Confirms transportation.   PLAN: patient has agreed to weekly follow calls.  Will send barrier letter to MD. Will send welcome packet to patient via mail.  Tomasa Rand, RN, BSN, CEN [facility] [employer] [phone]

## 2019-06-14 ENCOUNTER — Telehealth: Payer: Self-pay

## 2019-06-14 ENCOUNTER — Ambulatory Visit: Payer: Self-pay

## 2019-06-14 NOTE — ED Provider Notes (Signed)
[facility] Provider Note   CSN: [id number] Arrival date & time: 05/28/19  1707     History   Chief Complaint Chief Complaint  Patient presents with  . Shortness of Breath    HPI Paul [name] is a 73 y.o. male.     This is a 73 year old male with history of COPD who presents from his pulmonologist office due to increasing shortness of breath.  He endorses increased cough without fever or chills.  No CHF or anginal symptoms.  No recent vomiting or diarrhea.  Symptoms somewhat improved after using inhalers.  Possible COPD exacerbation versus COVID.  Was sent here for further evaluation.     Past Medical History:  Diagnosis Date  . A-fib ([place])   . COPD (chronic obstructive pulmonary disease) ([place])   . High cholesterol     Patient Active Problem List   Diagnosis Date Noted  . Vertebral compression fracture ([place]) 05/29/2019  . Chronic anemia 05/29/2019  . Pulmonary nodules 05/29/2019  . Viral pneumonitis 05/28/2019  . COPD with acute exacerbation ([place]) 05/28/2019  . Chronic anticoagulation 04/06/2019  . Coronary artery calcification 04/06/2019  . Atherosclerosis of aorta ([place]) 04/06/2019  . Paroxysmal atrial fibrillation ([facility])   . Acute on chronic respiratory failure with hypoxia ([place])   . COPD exacerbation ([place]) 03/19/2019  . Lung nodule < 6cm on CT 05/29/2016  . Former cigarette smoker 12/05/2015  . Chronic respiratory failure with hypoxia ([place]) 01/27/2013  . COPD GOLD  III/ 02 hs only    . High cholesterol     Past Surgical History:  Procedure Laterality Date  . CATARACT EXTRACTION Right   . SKIN SURGERY     shoulders and chest        Home Medications    Prior to Admission medications   Medication Sig Start Date End Date Taking? Authorizing Provider  albuterol (PROAIR HFA) 108 (90 Base) MCG/ACT inhaler Inhale 2 puffs into the lungs every 6 (six) hours as needed for wheezing or shortness of breath.   Yes  [provider]  albuterol (PROVENTIL) (2.5 MG/3ML) 0.083% nebulizer solution Take 3 mLs (2.5 mg total) by nebulization every 6 (six) hours as needed for wheezing or shortness of breath. 02/28/19  Yes [name], PA-C  apixaban (ELIQUIS) 5 MG TABS tablet Take 1 tablet (5 mg total) by mouth 2 (two) times daily. 04/06/19  Yes Jerline Pain, MD  atorvastatin (LIPITOR) 40 MG tablet Take 1 tablet (40 mg total) by mouth daily after supper. 04/06/19  Yes Jerline Pain, MD  budesonide-formoterol (SYMBICORT) 160-4.5 MCG/ACT inhaler Inhale 2 puffs into the lungs 2 (two) times a day. 05/14/19  Yes Fenton Foy, NP  diltiazem (CARDIZEM CD) 360 MG 24 hr capsule Take 1 capsule (360 mg total) by mouth daily. 04/06/19  Yes Jerline Pain, MD  doxylamine, Sleep, (UNISOM) 25 MG tablet Take 25 mg by mouth at bedtime as needed for sleep.   Yes [provider]  famotidine (PEPCID) 20 MG tablet One hour before  bedtime Patient taking differently: Take 20 mg by mouth daily after supper.  04/15/19  Yes Tanda Rockers, MD  furosemide (LASIX) 20 MG tablet Take 1 tablet (20 mg total) by mouth daily. 04/06/19  Yes Jerline Pain, MD  loratadine (CLARITIN) 10 MG tablet Take 10 mg by mouth daily.   Yes [provider]  LORazepam (ATIVAN) 0.5 MG tablet Take 0.5 mg by mouth daily as needed for  anxiety.  04/07/19  Yes [provider]  metoprolol tartrate (LOPRESSOR) 25 MG tablet Take 1 tablet (25 mg total) by mouth 2 (two) times daily. 04/12/19  Yes Jerline Pain, MD  OXYGEN Inhale 2 L into the lungs continuous.    Yes [provider]  pantoprazole (PROTONIX) 40 MG tablet Take 1 tablet (40 mg total) by mouth daily. Take 30-60 min before first meal of the day 04/15/19  Yes Tanda Rockers, MD  Phenylephrine-APAP-guaiFENesin (MUCINEX FAST-MAX) [phone] MG/20ML LIQD Take 15 mLs by mouth as needed (to loosen phlegm).    Yes [provider]  Respiratory Therapy Supplies  (FLUTTER) DEVI Use as directed Patient taking differently: as directed.  04/15/19  Yes Tanda Rockers, MD  sertraline (ZOLOFT) 50 MG tablet Take 50 mg by mouth daily.   Yes [provider]  Tiotropium Bromide Monohydrate (SPIRIVA RESPIMAT) 2.5 MCG/ACT AERS Inhale 2 puffs into the lungs daily. 05/14/19  Yes Fenton Foy, NP  predniSONE (DELTASONE) 10 MG tablet Take 4 tabs daily for 3 days, then 3 tabs daily for 3 days, then 2 tabs daily for 3 days, then 1 tab daily for 3 days 06/03/19   Alma Friendly, MD    Family History Family History  Problem Relation Age of Onset  . Heart disease Brother   . Heart disease Mother   . Heart disease Father   . Cancer Paternal Uncle     Social History Social History   Tobacco Use  . Smoking status: Former Smoker    Packs/day: 2.00    Years: 52.00    Pack years: 104.00    Types: Cigarettes    Quit date: 02/03/2009    Years since quitting: 10.3  . Smokeless tobacco: Never Used  . Tobacco comment: Counseled to remain smoke free  Substance Use Topics  . Alcohol use: Yes    Alcohol/week: 0.0 standard drinks    Comment: occ  . Drug use: No     Allergies   Patient has no known allergies.   Review of Systems Review of Systems  All other systems reviewed and are negative.    Physical Exam Updated Vital Signs BP (!) 141/57   Pulse 76   Temp 98.3 F (36.8 C) (Oral)   Resp 20   Ht 1.676 m (5\' 6" )   Wt 61 kg   SpO2 94%   BMI 21.71 kg/m   Physical Exam Vitals signs and nursing note reviewed.  Constitutional:      General: He is not in acute distress.    Appearance: Normal appearance. He is well-developed. He is not toxic-appearing.  HENT:     Head: Normocephalic and atraumatic.  Eyes:     General: Lids are normal.     Conjunctiva/sclera: Conjunctivae normal.     Pupils: Pupils are equal, round, and reactive to light.  Neck:     Musculoskeletal: Normal range of motion and neck supple.     Thyroid: No thyroid  mass.     Trachea: No tracheal deviation.  Cardiovascular:     Rate and Rhythm: Normal rate and regular rhythm.     Heart sounds: Normal heart sounds. No murmur. No gallop.   Pulmonary:     Effort: Pulmonary effort is normal. No respiratory distress.     Breath sounds: No stridor. Examination of the right-upper field reveals decreased breath sounds. Examination of the left-upper field reveals decreased breath sounds. Decreased breath sounds present. No wheezing, rhonchi or rales.  Abdominal:     General: Bowel sounds are normal. There is no distension.     Palpations: Abdomen is soft.     Tenderness: There is no abdominal tenderness. There is no rebound.  Musculoskeletal: Normal range of motion.        General: No tenderness.  Skin:    General: Skin is warm and dry.     Findings: No abrasion or rash.  Neurological:     Mental Status: He is alert and oriented to person, place, and time.     GCS: GCS eye subscore is 4. GCS verbal subscore is 5. GCS motor subscore is 6.     Cranial Nerves: No cranial nerve deficit.     Sensory: No sensory deficit.  Psychiatric:        Speech: Speech normal.        Behavior: Behavior normal.      ED Treatments / Results  Labs (all labs ordered are listed, but only abnormal results are displayed) Labs Reviewed  CBC WITH DIFFERENTIAL/PLATELET - Abnormal; Notable for the following components:      Result Value   Hemoglobin 11.9 (*)    MCH 24.6 (*)    Neutro Abs 8.8 (*)    Lymphs Abs 0.5 (*)    All other components within normal limits  COMPREHENSIVE METABOLIC PANEL - Abnormal; Notable for the following components:   Sodium 133 (*)    Chloride 91 (*)    Glucose, Bld 167 (*)    Creatinine, Ser 0.56 (*)    Calcium 8.7 (*)    Total Bilirubin 0.2 (*)    All other components within normal limits  CBC - Abnormal; Notable for the following components:   WBC 12.9 (*)    RBC 4.10 (*)    Hemoglobin 10.1 (*)    HCT 33.4 (*)    MCH 24.6 (*)    All  other components within normal limits  CBC - Abnormal; Notable for the following components:   WBC 11.8 (*)    Hemoglobin 11.5 (*)    HCT 37.6 (*)    MCH 25.2 (*)    All other components within normal limits  SARS CORONAVIRUS 2 (HOSPITAL ORDER, [facility])  BRAIN NATRIURETIC PEPTIDE  HIV ANTIBODY (ROUTINE TESTING W REFLEX)    EKG EKG Interpretation  Date/Time:  Friday May 28 2019 17:16:19 EDT Ventricular Rate:  96 PR Interval:    QRS Duration: 84 QT Interval:  337 QTC Calculation: 426 R Axis:   76 Text Interpretation:  Sinus rhythm Right atrial enlargement Anteroseptal infarct, old Confirmed by Lacretia Leigh ([id number]) on 05/28/2019 7:30:24 PM   Radiology No results found.  Procedures Procedures (including critical care time)  Medications Ordered in ED Medications  methylPREDNISolone sodium succinate (SOLU-MEDROL) 125 mg/2 mL injection 125 mg (125 mg Intravenous Given 05/28/19 1959)  albuterol (VENTOLIN HFA) 108 (90 Base) MCG/ACT inhaler 4 puff (4 puffs Inhalation Given 05/28/19 1959)  sodium chloride (PF) 0.9 % injection (  Given 05/29/19 0028)  iohexol (OMNIPAQUE) 350 MG/ML injection 100 mL (100 mLs Intravenous Contrast Given 05/28/19 2037)  albuterol (VENTOLIN HFA) 108 (90 Base) MCG/ACT inhaler 2 puff (2 puffs Inhalation Given 05/28/19 2310)  azithromycin (ZITHROMAX) tablet 500 mg (500 mg Oral Given 05/28/19 2352)  Melatonin TABS 3 mg (3 mg Oral Given 05/30/19 0041)  Melatonin TABS 6 mg (6 mg Oral Given 05/30/19 2146)  Melatonin TABS 6 mg (6 mg Oral Given 05/31/19 2150)  Initial Impression / Assessment and Plan / ED Course  I have reviewed the triage vital signs and the nursing notes.  Pertinent labs & imaging results that were available during my care of the patient were reviewed by me and considered in my medical decision making (see chart for details).        Patient here with increased dyspnea.  Concern for possible PE.  Chest CT ordered at this  time and signed out to Dr. Ralene Bathe  Final Clinical Impressions(s) / ED Diagnoses   Final diagnoses:  COPD exacerbation ([place])  Closed wedge compression fracture of eighth thoracic vertebra, initial encounter [facility])    ED Discharge Orders         Ordered    predniSONE (DELTASONE) 10 MG tablet     06/03/19 1039           Lacretia Leigh, MD 06/14/19 1039

## 2019-06-14 NOTE — Telephone Encounter (Signed)
Called and made patient aware of appointment date and time with Tera Helper in office. Explained to wear mask, come alone, screening done in lobby downstairs. Patient stated his understanding.         COVID-19 Pre-Screening Questions:  . In the past 7 to 10 days have you had a cough,  shortness of breath, headache, congestion, fever (100 or greater) body aches, chills, sore throat, or sudden loss of taste or sense of smell? no . Have you been around anyone with known Covid 19. no . Have you been around anyone who is awaiting Covid 19 test results in the past 7 to 10 days? no . Have you been around anyone who has been exposed to Covid 19, or has mentioned symptoms of Covid 19 within the past 7 to 10 days? no  If you have any concerns/questions about symptoms patients report during screening (either on the phone or at [facility]). Contact the provider seeing the patient or DOD for further guidance.  If neither are available contact a member of the leadership team.

## 2019-06-15 ENCOUNTER — Other Ambulatory Visit: Payer: Self-pay | Admitting: *Deleted

## 2019-06-15 ENCOUNTER — Other Ambulatory Visit: Payer: Self-pay

## 2019-06-15 ENCOUNTER — Encounter: Payer: Self-pay | Admitting: Internal Medicine

## 2019-06-15 ENCOUNTER — Ambulatory Visit (INDEPENDENT_AMBULATORY_CARE_PROVIDER_SITE_OTHER): Payer: Medicare Other | Admitting: Internal Medicine

## 2019-06-15 DIAGNOSIS — I2584 Coronary atherosclerosis due to calcified coronary lesion: Secondary | ICD-10-CM

## 2019-06-15 DIAGNOSIS — J441 Chronic obstructive pulmonary disease with (acute) exacerbation: Secondary | ICD-10-CM | POA: Diagnosis not present

## 2019-06-15 DIAGNOSIS — I2510 Atherosclerotic heart disease of native coronary artery without angina pectoris: Secondary | ICD-10-CM | POA: Diagnosis not present

## 2019-06-15 DIAGNOSIS — J9611 Chronic respiratory failure with hypoxia: Secondary | ICD-10-CM

## 2019-06-15 DIAGNOSIS — J449 Chronic obstructive pulmonary disease, unspecified: Secondary | ICD-10-CM

## 2019-06-15 MED ORDER — BUDESONIDE-FORMOTEROL FUMARATE 160-4.5 MCG/ACT IN AERO: 2.0000 | INHALATION_SPRAY | Freq: Two times a day (BID) | RESPIRATORY_TRACT | 0 refills | Status: DC

## 2019-06-15 MED ORDER — SPIRIVA RESPIMAT 2.5 MCG/ACT IN AERS: 2.0000 | INHALATION_SPRAY | Freq: Every day | RESPIRATORY_TRACT | 0 refills | Status: DC

## 2019-06-15 NOTE — Patient Outreach (Signed)
[facility]) Care Management  06/15/2019  Paul [name] February 28, 1946 [id number]   Transition of care call Covering for [place] care coordinator    Admission to [facility] 6/5- 6/11 Dx: COPD exacerbation  Successful outreach call to patient, introduced self and explained reason for the call.  Patient report that he is doing great as far as his breathing. He reports wearing oxygen only at night , states oxygen levels during the day have been 95% . Patient states that he is able to do his walking, he reports resting well at night and having a good appetite.  Patient reports visit with Dr.Wert on today and he was pleased with his progress.   Patient discussed having some back discomfort followed up with Dr. Tobie Poet on last week and has been prescribed, pain medication and muscle relaxer and so far it working.   Patient denies any new concerns at this time.   Plan Discussed with patient Tomasa Rand will continue weekly outreaches.   Joylene Draft, RN, [facility] Management Coordinator  [phone]- Mobile [phone]- Toll Free Main Office

## 2019-06-15 NOTE — Progress Notes (Signed)
Paul [name], male    DOB: 07/14/1946,    MRN: [id number]   Brief patient profile:  20 yowm quit smoking  02/03/09  with GOLD III spirometry baseline doe x able to golf. Only using 02 at  [facility] with  maint on symbicort/ spiriva and no chronic pred/ albuterol twice daily at most  much worse mid March 2020.   PFT 05/29/16: FVC 2.08 L (86%) FEV1 0.83 L (31%) FEV1/FVC 0.40 FEF 25-75 0.30 L (14%) no bronchodilator response TLC 6.56 L (107%) RV 167% ERV 160% DLCO corrected 60% (hemoglobin 10.9) 07/15/13: FVC 2.68 L (60%) FEV1 1.63 L (53%) FEV1/FVC 0.61 FEF 25-75 0.76 L (26%) 02/04/12: FVC 2.18 L (57%) FEV1 1.09 L (36%) FEV1/FVC 0.50 FEF 25-75 0.33 L (11%)   Admit date: 03/19/2019 Discharge date: 03/25/2019   Recommendations for Outpatient Follow-up:  1. F/u with PCP within a week  for hospital discharge follow up, repeat cbc/bmp at follow up 2. F/u with cardiology for new diagnosis of afib 3. Home 02  Discharge Diagnoses:      Active Hospital Problems   Diagnosis Date Noted  . PAF (paroxysmal atrial fibrillation) ([place])   . Acute on chronic respiratory failure with hypoxia ([place])   . COPD exacerbation ([place]) 03/19/2019         Filed Weights   03/23/19 1700 03/24/19 0503 03/25/19 0528  Weight: 60.2 kg 60 kg 62.3 kg    History of present illness: (per admitting MD Dr Earnest Conroy) PCP:Cox, Elnita Maxwell, MD Patient coming from:Home  Chief Complaint:Worsening shortness of breath  [name]:TOIZ Paul [name] a 73 y.o.malewith history h/oCOPD, hyperlipidemia whoat baseline was using 2 L O2 via nasal cannula only at night and follows [facility] (Dr. Vaughan Browner) as outpatient presents with worsening shortness of breath. Patient reports that at baseline he has cough with white phlegm and is able to walk around doing his ADLs without shortness of breath and uses O2 only at night. 2 weeks back he developed productive cough with green phlegm and shortness of breath but no fevers. He underwent  CT chest with contrast on March 18 which was negative for PE or infiltrates. He apparently was given doxycycline/prednisone course at that time which helped him transiently but symptoms worsened again past Monday with shortness of breath on minimum exertion/cough and he noticed his pulse ox was dropping to 74%.He started using 2 L O2 nasal cannula continuous. On Tuesday, he called pulmonary office given persistent symptoms and also pulse oximetry showing O2 sats84% on exertion and 93% at rest in spite of using continuous O2. He was advised by Dr. Vaughan Browner to use 60 mg prednisone x3 days followed by 10 mg taper every 3 days. He was also advised to use nebulizer treatments every 3 hours and continue Symbicort inhaler twice daily. Patient was supposed to start 50 mg prednisone today but woke up feeling extremely short of breath. He states he is also been having bad bouts of cough at night with wheezing and bronchospasm.  He presented to [facility] ED where he was noted to be afebrile but hypoxic, required BiPAP for 3 hours, chest x-ray was negative for infiltrates. Patient denies any recent history of travel or known sick contacts with flu positive or coronavirus positive patients. He lives at home with his wife who is recovering from lung cancer and completed chemotherapy 1 month back.  Hospitalist service was called to request direct admission to medical floor but ED to ED transfer facilitated for thorough clinical evaluation prior  to requesting medical bed given coronavirus pandemic. In the ED at [facility], patient has been saturating well on 3 L nasal cannula but appears tachypneic on talking full sentences. He denies any chest pain, denies any fevers or leg swellings.  Hospital Course:  Active Problems:   COPD exacerbation (HCC)   PAF (paroxysmal atrial fibrillation) (HCC)   Acute on chronic respiratory failure with hypoxia (HCC)   Acute onHypoxic respiratory failure,  COPDexacerbation,Progression of severe COPD -Emphysema, with no evidence of superimposed acute cardiopulmonary disease -improving on iv steroids, oral zitrhomax, he finished total of 5 days of zithromax in the hospital, he is discharged on slow prednisone taper.  - o2 dropped to 86% while ambulating on room air, discharged on continuous home o2 ( previously on nighttime o2 only) -he is to follow with Dr Vaughan Browner    Paroxsymalafib, New diagnosis of afib -he is started on eliquis (CHADS-VASC 2, age and aortic atherosclerosis), cardizem, lasix -cardiology consulted recommend "Diltiazem CD 360. Avoid amiodarone and beta blockers given his underlying lung dz." -outpatient echo per cardiology   Aortic and multivessel coronary atherosclerosis - Changed to high intensity statin, atorvastatin 40mg  PO QD. Check lipids and ALT as outpatient in 3 months.  - ASA 81. Aggressive secondary prevention. - No chest pain.      History of Present Illness  03/29/2019  Pulmonary/ extended post hosp f/u eval/Jarrell Armond  Re GOLD III copd ? 02 dep now Chief Complaint  Patient presents with  . Hospitalization Follow-up    Breathing has improved some. He has not used his albuterol inhaler or neb since hospital d/c.  Dyspnea:  mb and back ok off 02 sats upper 80s at end  Cough: none Sleep: recliner x 45 degrees = baseline 02 2lpm hs  SABA use: none  Since d/c on symb 160 2bid and spiriva each pm (technique 50% on both, see a/p) On pred 50 mg daily on day as ov rec Plan A = Automatic = symbicort 160 Take 2 puffs first thing in am and then another 2 puffs about 12 hours later.  Spiriva x 2 puffs  right after symbicort  Work on inhaler technique: Plan B = Backup Only use your albuterol inhaler(Proventil/Proair)  as a rescue medication Plan C = Crisis - only use your albuterol nebulizer if you first try Plan B and it fails to help > ok to use the nebulizer up to every 4 hours but if start needing it  regularly call for immediate appointment Plan D = Doctor - call me if B and C not adequate Plan E = ER - go to ER or call 911 if all else fails   02  2lpm at bedtime and adjust to keep it over 90% with activity       06/15/2019  f/u ov/Natasia Sanko re:  COPD III  maint symb/spiriva on prednisone still 15 mg daily  Chief Complaint  Patient presents with  . Follow-up    Breathing has improved  Dyspnea:  10 min x 3 x daily  Due to back / no hills sats usually lower 90s RA Cough: no  Sleeping: 10 degrees SABA use: none 02: 2lpm hs    No obvious day to day or daytime variability or assoc excess/ purulent sputum or mucus plugs or hemoptysis or cp or chest tightness, subjective wheeze or overt sinus or hb symptoms.   Sleeping without nocturnal  or early am exacerbation  of respiratory  c/o's or need for noct saba. Also denies any  obvious fluctuation of symptoms with weather or environmental changes or other aggravating or alleviating factors except as outlined above   No unusual exposure hx or h/o childhood pna/ asthma or knowledge of premature birth.  Current Allergies, Complete Past Medical History, Past Surgical History, Family History, and Social History were reviewed in [employer] record.  ROS  The following are not active complaints unless bolded Hoarseness, sore throat, dysphagia, dental problems, itching, sneezing,  nasal congestion or discharge of excess mucus or purulent secretions, ear ache,   fever, chills, sweats, unintended wt loss or wt gain, classically pleuritic or exertional cp,  orthopnea pnd or arm/hand swelling  or leg swelling, presyncope, palpitations, abdominal pain, anorexia, nausea, vomiting, diarrhea  or change in bowel habits or change in bladder habits, change in stools or change in urine, dysuria, hematuria,  rash, arthralgias, visual complaints, headache, numbness, weakness or ataxia or problems with walking or coordination,  change in mood or   memory.        Current Meds  Medication Sig  . albuterol (PROAIR HFA) 108 (90 Base) MCG/ACT inhaler Inhale 2 puffs into the lungs every 6 (six) hours as needed for wheezing or shortness of breath.  Marland Kitchen albuterol (PROVENTIL) (2.5 MG/3ML) 0.083% nebulizer solution Take 3 mLs (2.5 mg total) by nebulization every 6 (six) hours as needed for wheezing or shortness of breath.  Marland Kitchen apixaban (ELIQUIS) 5 MG TABS tablet Take 1 tablet (5 mg total) by mouth 2 (two) times daily.  Marland Kitchen atorvastatin (LIPITOR) 40 MG tablet Take 1 tablet (40 mg total) by mouth daily after supper.  . budesonide-formoterol (SYMBICORT) 160-4.5 MCG/ACT inhaler Inhale 2 puffs into the lungs 2 (two) times a day.  . diltiazem (CARDIZEM CD) 360 MG 24 hr capsule Take 1 capsule (360 mg total) by mouth daily.  . famotidine (PEPCID) 20 MG tablet One hour before  bedtime (Patient taking differently: Take 20 mg by mouth daily after supper. )  . furosemide (LASIX) 20 MG tablet Take 1 tablet (20 mg total) by mouth daily.  Marland Kitchen loratadine (CLARITIN) 10 MG tablet Take 10 mg by mouth daily.  Marland Kitchen LORazepam (ATIVAN) 0.5 MG tablet Take 0.5 mg by mouth daily as needed for anxiety.   . metoprolol tartrate (LOPRESSOR) 25 MG tablet Take 1 tablet (25 mg total) by mouth 2 (two) times daily.  Marland Kitchen oxycodone (OXY-IR) 5 MG capsule Take 5 mg by mouth every 12 (twelve) hours as needed.  . OXYGEN Inhale 2 L into the lungs continuous. With sleep only  Adapt  . pantoprazole (PROTONIX) 40 MG tablet Take 1 tablet (40 mg total) by mouth daily. Take 30-60 min before first meal of the day  . Phenylephrine-APAP-guaiFENesin (MUCINEX FAST-MAX) 10-650-400 MG/20ML LIQD Take 15 mLs by mouth as needed (to loosen phlegm).   . predniSONE (DELTASONE) 10 MG tablet Take 4 tabs daily for 3 days, then 3 tabs daily for 3 days, then 2 tabs daily for 3 days, then 1 tab daily for 3 days  . Respiratory Therapy Supplies (FLUTTER) DEVI Use as directed (Patient taking differently: as directed. )  .  sertraline (ZOLOFT) 50 MG tablet Take 50 mg by mouth daily.  . Tiotropium Bromide Monohydrate (SPIRIVA RESPIMAT) 2.5 MCG/ACT AERS Inhale 2 puffs into the lungs daily.  . tizanidine (ZANAFLEX) 2 MG capsule Take 2 mg by mouth 3 (three) times daily.               Objective:     Wt Readings from  Last 3 Encounters:  06/15/19 136 lb (61.7 kg)  05/29/19 134 lb 7.7 oz (61 kg)  05/28/19 134 lb 3.2 oz (60.9 kg)     Vital signs reviewed - Note on arrival 02 sats  95% on RA     amb wm "best I've been" very colorfully dressed          HEENT: full dentures, nl oropharynx. Nl external ear canals without cough reflex -  Mild bilateral non-specific turbinate edema     NECK :  without JVD/Nodes/TM/ nl carotid upstrokes bilaterally   LUNGS: no acc muscle use,  Mod barrel  contour chest wall with bilateral  Distant bs s audible wheeze and  without cough on insp or exp maneuver and mod  Hyperresonant  to  percussion bilaterally     CV:  RRR  no s3 or murmur or increase in P2, and no edema   ABD:  soft and nontender with pos mod insp Hoover's  in the supine position. No bruits or organomegaly appreciated, bowel sounds nl  MS:   Nl gait/  ext warm without deformities, calf tenderness, cyanosis or clubbing No obvious joint restrictions   SKIN: warm and dry without lesions    NEURO:  alert, approp, nl sensorium with  no motor or cerebellar deficits apparent.      CV:  RRR  no s3 or murmur or increase in P2, and trace ankle edema sym bilaterally    ABD:  soft and nontender with pos mid  insp Hoover's  in the supine position. No bruits or organomegaly appreciated, bowel sounds nl  MS:   Nl gait/  ext warm without deformities, calf tenderness, cyanosis or clubbing No obvious joint restrictions   SKIN: warm and dry without lesions    NEURO:  alert, approp, nl sensorium with  no motor or cerebellar deficits apparent.      I personally reviewed images and agree with radiology impression  as follows:  Chest CT w/o contrast 03/22/19 No acute CT finding, with no CT evidence of pneumonia or bronchitis. Paraseptal and centrilobular emphysema.  Labs ordered/ reviewed:      Chemistry      Component Value Date/Time   NA 138 06/16/2019 1412   K 4.3 06/16/2019 1412   CL 96 06/16/2019 1412   CO2 26 06/16/2019 1412   BUN 12 06/16/2019 1412   CREATININE 0.80 06/16/2019 1412      Component Value Date/Time   CALCIUM 9.0 06/16/2019 1412   ALKPHOS 74 05/28/2019 1724   AST 23 05/28/2019 1724   ALT 40 05/28/2019 1724   BILITOT 0.2 (L) 05/28/2019 1724        Lab Results  Component Value Date   WBC 11.0 (H) 06/16/2019   HGB 11.3 (L) 06/16/2019   HCT 36.0 (L) 06/16/2019   MCV 78 (L) 06/16/2019   PLT 347 06/16/2019            Labs ordered 06/15/2019    Alpha one screen      Assessment

## 2019-06-15 NOTE — Progress Notes (Signed)
CARDIOLOGY OFFICE NOTE  Date:  06/16/2019    Paul [name] Date of Birth: 11-Mar-1946 Medical Record #[id number]  PCP:  [name], MD  Cardiologist:  [facility]   Chief Complaint  Patient presents with  . Atrial Fibrillation    Follow up visit - seen for Dr. Marlou Porch    History of Present Illness: Paul [name] is a 73 y.o. male who presents today for a follow up visit. Seen for Dr. Marlou Porch.   He has a history of severe COPD, chronic respiratory failure, prior AF with RVR and HLD. Prior CT with bilateral pulmonary nodules.   Last seen for a telehealth visit back in April - felt to be doing ok from our standpoint. He had been referred to the AF clinic - seen there in 03/2019 - was to consider TIkosyn load - looks like this did not happen due to getting readmitted with COPD exacerbation.   Admitted earlier this month with COPD exacerbation - recurring theme.   The patient does not have symptoms concerning for COVID-19 infection (fever, chills, cough, or new shortness of breath).   Comes in today. Here alone. He notes difficulty with the mask with his underlying COPD. He is on tapering doses of Prednisone - this has exacerbated his bruising. He has no awareness of any palpitations. No chest pain. He has had some swelling in his feet - typically tries to elevate and this resolves it - but he took extra Lasix today. He is on high dose CCB as well. He seems more limited by his back - he has a vertebral fracture - felt to be due to coughing. No fever.   Past Medical History:  Diagnosis Date  . A-fib ([place])   . COPD (chronic obstructive pulmonary disease) ([place])   . High cholesterol     Past Surgical History:  Procedure Laterality Date  . CATARACT EXTRACTION Right   . SKIN SURGERY     shoulders and chest     Medications: Current Meds  Medication Sig  . albuterol (PROAIR HFA) 108 (90 Base) MCG/ACT inhaler Inhale 2 puffs into the lungs every 6 (six) hours as needed for wheezing or  shortness of breath.  Marland Kitchen albuterol (PROVENTIL) (2.5 MG/3ML) 0.083% nebulizer solution Take 3 mLs (2.5 mg total) by nebulization every 6 (six) hours as needed for wheezing or shortness of breath.  Marland Kitchen apixaban (ELIQUIS) 5 MG TABS tablet Take 1 tablet (5 mg total) by mouth 2 (two) times daily.  Marland Kitchen atorvastatin (LIPITOR) 40 MG tablet Take 1 tablet (40 mg total) by mouth daily after supper.  . budesonide-formoterol (SYMBICORT) 160-4.5 MCG/ACT inhaler Inhale 2 puffs into the lungs 2 (two) times a day.  . diltiazem (CARDIZEM CD) 360 MG 24 hr capsule Take 1 capsule (360 mg total) by mouth daily.  . famotidine (PEPCID) 20 MG tablet One hour before  bedtime (Patient taking differently: Take 20 mg by mouth daily after supper. )  . furosemide (LASIX) 20 MG tablet Take 1 tablet (20 mg total) by mouth daily.  Marland Kitchen loratadine (CLARITIN) 10 MG tablet Take 10 mg by mouth daily.  Marland Kitchen LORazepam (ATIVAN) 0.5 MG tablet Take 0.5 mg by mouth daily as needed for anxiety.   . metoprolol tartrate (LOPRESSOR) 25 MG tablet Take 1 tablet (25 mg total) by mouth 2 (two) times daily.  Marland Kitchen oxycodone (OXY-IR) 5 MG capsule Take 5 mg by mouth every 12 (twelve) hours as needed.  . OXYGEN Inhale 2 L into the lungs continuous.  With sleep only  Adapt  . pantoprazole (PROTONIX) 40 MG tablet Take 1 tablet (40 mg total) by mouth daily. Take 30-60 min before first meal of the day  . Phenylephrine-APAP-guaiFENesin (MUCINEX FAST-MAX) 10-650-400 MG/20ML LIQD Take 15 mLs by mouth as needed (to loosen phlegm).   . predniSONE (DELTASONE) 10 MG tablet Take 4 tabs daily for 3 days, then 3 tabs daily for 3 days, then 2 tabs daily for 3 days, then 1 tab daily for 3 days  . Respiratory Therapy Supplies (FLUTTER) DEVI Use as directed (Patient taking differently: as directed. )  . sertraline (ZOLOFT) 50 MG tablet Take 50 mg by mouth daily.  . Tiotropium Bromide Monohydrate (SPIRIVA RESPIMAT) 2.5 MCG/ACT AERS Inhale 2 puffs into the lungs daily.  . tizanidine  (ZANAFLEX) 2 MG capsule Take 2 mg by mouth 3 (three) times daily.     Allergies: No Known Allergies  Social History: The patient  reports that he quit smoking about 10 years ago. His smoking use included cigarettes. He has a 104.00 pack-year smoking history. He has never used smokeless tobacco. He reports current alcohol use. He reports that he does not use drugs.   Family History: The patient's family history includes Cancer in his paternal uncle; Heart disease in his brother, father, and mother.   Review of Systems: Please see the history of present illness.   All other systems are reviewed and negative.   Physical Exam: VS:  BP 138/82   Pulse 83   Ht 5\' 6"  (1.676 m)   Wt 137 lb (62.1 kg)   SpO2 (!) 89% Comment: walking in /95 after rest  BMI 22.11 kg/m  .  BMI Body mass index is 22.11 kg/m.  Wt Readings from Last 3 Encounters:  06/16/19 137 lb (62.1 kg)  06/15/19 136 lb (61.7 kg)  05/29/19 134 lb 7.7 oz (61 kg)    General: Pleasant. Alert. Chronically ill. Short of breath with activity. Thin.  HEENT: Normal.  Neck: Supple, no JVD, carotid bruits, or masses noted.  Cardiac: Regular rate and rhythm. No murmurs, rubs, or gallops. No edema.  Respiratory:  Lungs are clear to auscultation bilaterally with normal work of breathing.  GI: Soft and nontender.  MS: No deformity or atrophy. Gait and ROM intact.  Skin: Warm and dry. Color is normal. Multiple areas of bruising.  Neuro:  Strength and sensation are intact and no gross focal deficits noted.  Psych: Alert, appropriate and with normal affect.   LABORATORY DATA:  EKG:  EKG is not ordered today.   Lab Results  Component Value Date   WBC 11.8 (H) 06/02/2019   HGB 11.5 (L) 06/02/2019   HCT 37.6 (L) 06/02/2019   PLT 339 06/02/2019   GLUCOSE 167 (H) 05/28/2019   ALT 40 05/28/2019   AST 23 05/28/2019   NA 133 (L) 05/28/2019   K 4.1 05/28/2019   CL 91 (L) 05/28/2019   CREATININE 0.56 (L) 05/28/2019   BUN 11  05/28/2019   CO2 29 05/28/2019   TSH 2.539 03/22/2019   INR 1.1 04/14/2019       BNP (last 3 results) Recent Labs    04/14/19 2107 05/03/19 1324 05/28/19 1724  BNP 90.4 58.4 56.6    ProBNP (last 3 results) No results for input(s): PROBNP in the last 8760 hours.   Other Studies Reviewed Today:   Assessment/Plan:   1. PAF - on anticoagulation - no amiodarone due to underlying lung disease but he is on low  dose beta blocker  - has been in the AF clinic - apparently no Tikosyn load per the patient. His monitor was predominantly NSR.   2. Severe COPD - chronic respiratory failure - using home oxygen. Per pulmonary. On tapering prednisone.   3. Aortic and multivessel coronary atherosclerosis -Atorvastatin 40 mg was prescribed, high intensity.  On Eliquis. No ASA 81 to minimize bleeding risk.   He has no active chest pain - will go ahead and get his Myoview per recommendation from Dr. Marlou Porch.   4. Swelling - on high dose CCB - ok to use extra Lasix for another 2 days - if persists, we will need to cut the CCB back - he is to continue with elevating his legs/feet. Also needs to avoid salt - unclear how much take out they are eating.   5. COVID-19 Education: The signs and symptoms of COVID-19 were discussed with the patient and how to seek care for testing (follow up with PCP or arrange E-visit).  The importance of social distancing, staying at home, hand hygiene and wearing a mask when out in public were discussed today.  Current medicines are reviewed with the patient today.  The patient does not have concerns regarding medicines other than what has been noted above.  The following changes have been made:  See above.  Labs/ tests ordered today include:    Orders Placed This Encounter  Procedures  . Basic metabolic panel  . CBC  . MYOCARDIAL PERFUSION IMAGING     Disposition:   FU with [place] in about 4 months unless Myoview is abnormal.  Advised to call us if swelling  persists.    Patient is agreeable to this plan and will call if any problems develop in the interim.   SignedTruitt Merle, NP  06/16/2019 1:57 PM  [facility] [address] [place], [place]  [address] Phone: [phone] Fax: [phone]

## 2019-06-15 NOTE — Patient Instructions (Addendum)
Prednisone 10 mg with bfast x one week then 1/2 x one week then 1/2 on even days x 2 weeks and stop  At any point get worse, increase back to 20 mg daily and start over  Please remember to go to the lab department   for your tests - we will call you with the results when they are available.       Please schedule a follow up office visit in 6 weeks, call sooner if needed

## 2019-06-16 ENCOUNTER — Encounter: Payer: Self-pay | Admitting: Nurse Practitioner

## 2019-06-16 ENCOUNTER — Ambulatory Visit (INDEPENDENT_AMBULATORY_CARE_PROVIDER_SITE_OTHER): Payer: Medicare Other | Admitting: Nurse Practitioner

## 2019-06-16 ENCOUNTER — Other Ambulatory Visit: Payer: Self-pay

## 2019-06-16 VITALS — BP 138/82 | HR 83 | Ht 66.0 in | Wt 137.0 lb

## 2019-06-16 DIAGNOSIS — I700 Atherosclerosis of aorta: Secondary | ICD-10-CM

## 2019-06-16 DIAGNOSIS — I480 Paroxysmal atrial fibrillation: Secondary | ICD-10-CM

## 2019-06-16 DIAGNOSIS — J438 Other emphysema: Secondary | ICD-10-CM

## 2019-06-16 DIAGNOSIS — Z7901 Long term (current) use of anticoagulants: Secondary | ICD-10-CM

## 2019-06-16 DIAGNOSIS — I2584 Coronary atherosclerosis due to calcified coronary lesion: Secondary | ICD-10-CM

## 2019-06-16 DIAGNOSIS — Z79899 Other long term (current) drug therapy: Secondary | ICD-10-CM | POA: Diagnosis not present

## 2019-06-16 DIAGNOSIS — I2510 Atherosclerotic heart disease of native coronary artery without angina pectoris: Secondary | ICD-10-CM | POA: Diagnosis not present

## 2019-06-16 NOTE — Patient Instructions (Addendum)
After Visit Summary:  We will be checking the following labs today - BMET & CBC   Medication Instructions:    Continue with your current medicines.   Ok to use an extra Lasix for the next 2 days   If you need a refill on your cardiac medications before your next appointment, please call your pharmacy.     Testing/Procedures To Be Arranged:   [place] are scheduled for a Myocardial Perfusion Imaging Study on __________________________________ at ________________________________________.   Please arrive 15 minutes prior to your appointment time for registration and insurance purposes.   The test will take approximately 3 to 4 hours to complete; you may bring reading material. If someone comes with you to your appointment, they will need to remain in the main lobby due to limited space in the testing area.    How to prepare for your Myocardial Perfusion test:   Do not eat or drink 3 hours prior to your test, except you may have water.    Do not consume products containing caffeine (regular or decaffeinated) 12 hours prior to your test (ex: coffee, chocolate, soda, tea)   Do bring a list of your current medications with you. If not listed below, you may take your medications as normal.    Bring any held medication to your appointment, as you may be required to take it once the test is complete. Bring your inhalers with you.   Do wear comfortable clothes (no dresses or overalls) and walking shoes. Tennis shoes are preferred. No heels or open toed shoes.  Do not wear cologne, perfume, aftershave or lotions (deodorant is allowed).   If these instructions are not followed, you test will have to be rescheduled.   Please report to [address] for your test. If you have questions or concerns about your appointment, please call the Nuclear Lab at [phone].  If you cannot keep your appointment, please provide 24 hour notification to the  Nuclear lab to avoid a possible $50 charge to your account.    Follow-Up:   See Dr. Marlou Porch in about 4 months    At [facility], you and your health needs are our priority.  As part of our continuing mission to provide you with exceptional heart care, we have created designated Provider Care Teams.  These Care Teams include your primary Cardiologist (physician) and Advanced Practice Providers (APPs -  Physician Assistants and Nurse Practitioners) who all work together to provide you with the care you need, when you need it.  Special Instructions:  . Stay safe, stay home, wash your hands for at least 20 seconds and wear a mask when out in public.  . It was good to talk with you today.    Call the [place] office at [phone] if you have any questions, problems or concerns.

## 2019-06-17 ENCOUNTER — Ambulatory Visit: Payer: Medicare Other | Admitting: Internal Medicine

## 2019-06-17 LAB — BASIC METABOLIC PANEL
BUN/Creatinine Ratio: 15 (ref 10–24)
BUN: 12 mg/dL (ref 8–27)
CO2: 26 mmol/L (ref 20–29)
Calcium: 9 mg/dL (ref 8.6–10.2)
Chloride: 96 mmol/L (ref 96–106)
Creatinine, Ser: 0.8 mg/dL (ref 0.76–1.27)
GFR calc Af Amer: 102 mL/min/{1.73_m2} (ref >59)
GFR calc non Af Amer: 88 mL/min/{1.73_m2} (ref >59)
Glucose: 97 mg/dL (ref 65–99)
Potassium: 4.3 mmol/L (ref 3.5–5.2)
Sodium: 138 mmol/L (ref 134–144)

## 2019-06-17 LAB — CBC
Hematocrit: 36 % — ABNORMAL LOW (ref 37.5–51.0)
Hemoglobin: 11.3 g/dL — ABNORMAL LOW (ref 13.0–17.7)
MCH: 24.5 pg — ABNORMAL LOW (ref 26.6–33.0)
MCHC: 31.4 g/dL — ABNORMAL LOW (ref 31.5–35.7)
MCV: 78 fL — ABNORMAL LOW (ref 79–97)
Platelets: 347 10*3/uL (ref 150–450)
RBC: 4.61 x10E6/uL (ref 4.14–5.80)
RDW: 15.1 % (ref 11.6–15.4)
WBC: 11 10*3/uL — ABNORMAL HIGH (ref 3.4–10.8)

## 2019-06-20 ENCOUNTER — Encounter: Payer: Self-pay | Admitting: Internal Medicine

## 2019-06-20 LAB — ALPHA-1 ANTITRYPSIN PHENOTYPE: A-1 Antitrypsin, Ser: 178 mg/dL (ref 83–199)

## 2019-06-20 NOTE — Assessment & Plan Note (Signed)
02/06/12 ONO RA: desat <88% 7 episodes per hour, 66 episodes total 01/27/2013 ONO RA desat < 88% 80min of recording , still needs nocturnal oxygen - 03/25/2019 d/c from admit on 2lpm but not using x at hs  - 04/15/2019 02 sats 94% at rest despite flare in copd so rec 2lpm  Hs and prn activity  to keep sats > 90% at all times   As of 06/15/2019   2lpm hs and prn daytime   Adequate control on present rx, reviewed in detail with pt > no change in rx needed

## 2019-06-20 NOTE — Assessment & Plan Note (Signed)
Quit smoking 01/2009  02/04/12 [name]: FEV1 36% FVC 57% FEF 25 75    11% -05/29/16  FEV1 0.83 L (31%) FEV1/FVC 0.40 FEF 25-75 0.30 L (14%) no bronchodilator response p ? rx prior, DLCO corrected 60% (hemoglobin 10.9) - 03/29/2019   continue symb 160 2bid and spiriva smi 2 puffs each am  - 04/15/2019  After extensive coaching inhaler device,  effectiveness =    90% with both hfa and smi but not understanding contingencies for hfa/ neb  - 04/15/2019 placed on daily pred with ceiling 40 and floor of 10 mg daily and empirical rx for GERD - 04/15/2019 flutter valve training  - alpha one screen 06/15/2019  - 06/15/2019  After extensive coaching inhaler device,  effectiveness =    90% > so try to taper pred off using ceiling of 20 if worse    Comment: The goal with a chronic steroid dependent illness is always arriving at the lowest effective dose that controls the disease/symptoms and not accepting a set "formula" which is based on statistics or guidelines that don't always take into account patient  variability or the natural hx of the dz in every individual patient, which may well vary over time.  For now therefore  >>>>  recommend the patient maintain  Ceiling of 20 but doing so well now would rec gradual taper off and if flares again could try daliresp   I had an extended discussion with the patient/wife reviewing all relevant studies completed to date and  lasting 15 to 20 minutes of a 25 minute visit    See device teaching which extended face to face time for this visit.  Each maintenance medication was reviewed in detail including emphasizing most importantly the difference between maintenance and prns and under what circumstances the prns are to be triggered using an action plan format that is not reflected in the computer generated alphabetically organized AVS which I have not found useful in most complex patients, especially with respiratory illnesses  Please see AVS for specific instructions  unique to this visit that I personally wrote and verbalized to the the pt in detail and then reviewed with pt  by my nurse highlighting any  changes in therapy recommended at today's visit to their plan of care.

## 2019-06-23 ENCOUNTER — Telehealth (HOSPITAL_COMMUNITY): Payer: Self-pay | Admitting: *Deleted

## 2019-06-23 NOTE — Telephone Encounter (Signed)
Patient given detailed instructions per Myocardial Perfusion Study Information Sheet for the test on 06/28/19 at 10:45. Patient notified to arrive 15 minutes early and that it is imperative to arrive on time for appointment to keep from having the test rescheduled.  If you need to cancel or reschedule your appointment, please call the office within 24 hours of your appointment. . Patient verbalized understanding.Paul [name]

## 2019-06-28 ENCOUNTER — Other Ambulatory Visit: Payer: Self-pay

## 2019-06-28 ENCOUNTER — Encounter (HOSPITAL_COMMUNITY): Payer: Self-pay

## 2019-06-28 ENCOUNTER — Telehealth: Payer: Self-pay | Admitting: *Deleted

## 2019-06-28 ENCOUNTER — Ambulatory Visit (HOSPITAL_COMMUNITY): Payer: Medicare Other | Attending: Internal Medicine

## 2019-06-28 DIAGNOSIS — I480 Paroxysmal atrial fibrillation: Secondary | ICD-10-CM | POA: Insufficient documentation

## 2019-06-28 DIAGNOSIS — I700 Atherosclerosis of aorta: Secondary | ICD-10-CM | POA: Insufficient documentation

## 2019-06-28 LAB — MYOCARDIAL PERFUSION IMAGING
LV dias vol: 68 mL (ref 62–150)
LV sys vol: 21 mL
Peak HR: 88 {beats}/min
Rest HR: 73 {beats}/min
SDS: 1
SRS: 0
SSS: 1
TID: 1.05

## 2019-06-28 MED ORDER — TECHNETIUM TC 99M TETROFOSMIN IV KIT
10.3000 | PACK | Freq: Once | INTRAVENOUS | Status: AC | PRN
  Administered 2019-06-28: 10.3 via INTRAVENOUS
  Filled 2019-06-28: qty 11

## 2019-06-28 MED ORDER — REGADENOSON 0.4 MG/5ML IV SOLN
0.4000 mg | Freq: Once | INTRAVENOUS | Status: AC
  Administered 2019-06-28: 0.4 mg via INTRAVENOUS

## 2019-06-28 MED ORDER — TECHNETIUM TC 99M TETROFOSMIN IV KIT
30.9000 | PACK | Freq: Once | INTRAVENOUS | Status: AC | PRN
  Administered 2019-06-28: 30.9 via INTRAVENOUS
  Filled 2019-06-28: qty 31

## 2019-06-28 NOTE — Telephone Encounter (Signed)
Would have him monitor for now - he is already on pretty high dose of Diltiazem - which has probably caused some swelling - has significant COPD and would be hesitant to increase the beta blocker.   No changes for now.   Burtis Junes, RN, [facility] [address] [place], [place]  [address] [phone]

## 2019-06-28 NOTE — Telephone Encounter (Signed)
Pt has been notified of Myoview results by phone with verbal understanding.   Pt states to me that last Thursday 7/2 and Friday 7/3 he had some faster heart rates between 104-114. He states this happened about 4-5 times over the 2 days and each time lasted maybe about 10-15 minutes. Pt asked what should he do if this happens again. I advised pt to make sure he is staying hydrated in this heat, limit caffeine. Advised to call the office if HR keeps running high and/or he is not feeling good.   Pt denies any cp, sob, lightheadedness/dizziness or pre-syncope. Pt states each time he was just sitting and was not active. Pt is not sure if maybe one of his medications may need to be adjusted, I assured the pt that I will send my notes to Truitt Merle, NP for further recommendations and that we will call him back after her review. Pt says he has been doing fine since. Pt thanked me for the call and the help.

## 2019-06-28 NOTE — Patient Outreach (Signed)
[facility]) Care Management  06/28/2019   Paul [name] [birth year] [phone] Transition of care call.  Subjective: Placed all to patient who reports that he will talk to me today but is tired of calls.. Reports that he is doing well. Wife is caregiver and is available for assistance. Has 3 children that help as well. Patient reports his biggest medical concern is his COPD. Report worsening of his COPD in the last 3 months.  Reports he has close follow up with Dr. Melvyn Novas.  Reports everytime he tries to wean from the steroids he has worsening of his breathing. Patient reports timely follow up with primary MD.  Reports he knows when to call MD for worsening signs and symtoms.   Objective: awake and alert on phone all. Talking in complete sentences without difficulty.   Vitals:   06/28/19 1647  Weight: 138 lb 8 oz (62.8 kg)  Height: 1.676 m (5\' 6" )    Current Medications:  Current Outpatient Medications  Medication Sig Dispense Refill  . albuterol (PROAIR HFA) 108 (90 Base) MCG/ACT inhaler Inhale 2 puffs into the lungs every 6 (six) hours as needed for wheezing or shortness of breath.    Marland Kitchen albuterol (PROVENTIL) (2.5 MG/3ML) 0.083% nebulizer solution Take 3 mLs (2.5 mg total) by nebulization every 6 (six) hours as needed for wheezing or shortness of breath. 75 mL 12  . apixaban (ELIQUIS) 5 MG TABS tablet Take 1 tablet (5 mg total) by mouth 2 (two) times daily. 180 tablet 3  . atorvastatin (LIPITOR) 40 MG tablet Take 1 tablet (40 mg total) by mouth daily after supper. 90 tablet 3  . budesonide-formoterol (SYMBICORT) 160-4.5 MCG/ACT inhaler Inhale 2 puffs into the lungs 2 (two) times a day. 1 Inhaler 0  . diltiazem (CARDIZEM CD) 360 MG 24 hr capsule Take 1 capsule (360 mg total) by mouth daily. 180 capsule 3  . famotidine (PEPCID) 20 MG tablet One hour before  bedtime (Patient taking differently: Take 20 mg by mouth daily after supper. ) 30 tablet 11  . furosemide (LASIX) 20 MG  tablet Take 1 tablet (20 mg total) by mouth daily. 90 tablet 3  . loratadine (CLARITIN) 10 MG tablet Take 10 mg by mouth daily.    Marland Kitchen LORazepam (ATIVAN) 0.5 MG tablet Take 0.5 mg by mouth daily as needed for anxiety.     . metoprolol tartrate (LOPRESSOR) 25 MG tablet Take 1 tablet (25 mg total) by mouth 2 (two) times daily. 180 tablet 3  . OXYGEN Inhale 2 L into the lungs continuous. With sleep only  Adapt    . pantoprazole (PROTONIX) 40 MG tablet Take 1 tablet (40 mg total) by mouth daily. Take 30-60 min before first meal of the day 30 tablet 2  . Phenylephrine-APAP-guaiFENesin (MUCINEX FAST-MAX) 10-650-400 MG/20ML LIQD Take 15 mLs by mouth as needed (to loosen phlegm).     . predniSONE (DELTASONE) 10 MG tablet Take 4 tabs daily for 3 days, then 3 tabs daily for 3 days, then 2 tabs daily for 3 days, then 1 tab daily for 3 days 30 tablet 0  . Respiratory Therapy Supplies (FLUTTER) DEVI Use as directed (Patient taking differently: as directed. ) 1 each 0  . sertraline (ZOLOFT) 50 MG tablet Take 50 mg by mouth daily.    . Tiotropium Bromide Monohydrate (SPIRIVA RESPIMAT) 2.5 MCG/ACT AERS Inhale 2 puffs into the lungs daily. 8 g 0  . oxycodone (OXY-IR) 5 MG capsule Take 5 mg by mouth every  12 (twelve) hours as needed.    . tizanidine (ZANAFLEX) 2 MG capsule Take 2 mg by mouth 3 (three) times daily.     No current facility-administered medications for this visit.     Functional Status:  In your present state of health, do you have any difficulty performing the following activities: 06/28/2019 05/29/2019  Hearing? N N  Vision? N N  Difficulty concentrating or making decisions? N N  Walking or climbing stairs? N N  Dressing or bathing? N N  Doing errands, shopping? N -  Preparing Food and eating ? N -  Using the Toilet? N -  In the past six months, have you accidently leaked urine? N -  Do you have problems with loss of bowel control? N -  Managing your Medications? N -  Managing your Finances? N  -  Housekeeping or managing your Housekeeping? N -  Some recent data might be hidden    Fall/Depression Screening: Fall Risk  06/28/2019 08/21/2016  Falls in the past year? 0 No  Comment - Emmi Telephone Survey: data to providers prior to load   PHQ 2/9 Scores 06/28/2019  PHQ - 2 Score 0    Assessment:  (1) Recent problems with COPD exacerbations. (2) transition of care. (3) active, denies falls (4) has advanced directives. (5) follow up planned with Dr. Tobie Poet on 07/20/2019 Plan:  (1) reviewed [facility] program again. Patient has agreed to talk on the phone for 1 more week for a short call. (2) Will continue with one more call next week (3) encouraged patient to be active early morning or late evening due to the heat. (4) Reviewed with patient advanced directives on fie with MD office and the hospital. (5) encouraged patient to continue to follow up with MD and report any changes in condition.   This note, barrier letter sent to MD.  [employer] CM Care Plan Problem One     Most Recent Value  Care Plan Problem One  Recent admission for COPD complications.  Role Documenting the Problem One  Care Management [place] for Problem One  Active  [facility] Long Term Goal   Patient will report no readmissions to the hospital for COPD complications in the next 60 days.   [facility] Long Term Goal Start Date  06/07/19  Interventions for Problem One Long Term Goal  Reviewed current progress. Reviewed COPD zones via phone and when to call MD  [facility] CM Short Term Goal #1   Patient will report any changes in condition to MD in the next 30 days.   [facility] CM Short Term Goal #1 Start Date  05/31/19  Interventions for Short Term Goal #1  Reviewed follow up plan with patient for MD appointments and ensured transportation.   [facility] CM Short Term Goal #2   Patient will report improved nutrition and energy levels in the next 30 days.   [facility] CM Short Term Goal #2 Start Date  06/07/19  Interventions for Short Term Goal #2   Encouraged patient to stay hydrated and eat well. reviewed weight and use of nutitional supplements.       Tomasa Rand, RN, BSN, CEN [facility] [employer] [phone]

## 2019-06-29 NOTE — Telephone Encounter (Signed)
Called and made patient aware of Paul [name]'s recommendations below. Instructed for the patient to let us know if his Sx change or worsen. Patient verbalized understanding and thanked me for the call.

## 2019-07-06 ENCOUNTER — Telehealth: Payer: Self-pay | Admitting: Internal Medicine

## 2019-07-06 ENCOUNTER — Other Ambulatory Visit: Payer: Self-pay | Admitting: Internal Medicine

## 2019-07-06 NOTE — Telephone Encounter (Signed)
Assessment & Plan Note by Tanda Rockers, MD at 06/20/2019 6:10 AM Author: Tanda Rockers, MD Author Type: Physician Filed: 06/20/2019 6:10 AM  Note Status: Written Cosign: Cosign Not Required Encounter Date: 06/15/2019  Problem: Chronic respiratory failure with hypoxia ([place])  Editor: Tanda Rockers, MD (Physician)    02/06/12 ONO RA: desat <88% 7 episodes per hour, 66 episodes total 01/27/2013 ONO RA desat < 88% 75min of recording , still needs nocturnal oxygen - 03/25/2019 d/c from admit on 2lpm but not using x at hs  - 04/15/2019 02 sats 94% at rest despite flare in copd so rec 2lpm  Hs and prn activity  to keep sats > 90% at all times   As of 06/15/2019   2lpm hs and prn daytime   Adequate control on present rx, reviewed in detail with pt > no change in rx needed      Assessment & Plan Note by Tanda Rockers, MD at 06/20/2019 6:08 AM Author: Tanda Rockers, MD Author Type: Physician Filed: 06/20/2019 6:09 AM  Note Status: Written Cosign: Cosign Not Required Encounter Date: 06/15/2019  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 rx prior, DLCO corrected 60% (hemoglobin 10.9) - 03/29/2019   continue symb 160 2bid and spiriva smi 2 puffs each am  - 04/15/2019  After extensive coaching inhaler device,  effectiveness =    90% with both hfa and smi but not understanding contingencies for hfa/ neb  - 04/15/2019 placed on daily pred with ceiling 40 and floor of 10 mg daily and empirical rx for GERD - 04/15/2019 flutter valve training  - alpha one screen 06/15/2019  - 06/15/2019  After extensive coaching inhaler device,  effectiveness =    90% > so try to taper pred off using ceiling of 20 if worse    Comment: The goal with a chronic steroid dependent illness is always arriving at the lowest  effective dose that controls the disease/symptoms and not accepting a set "formula" which is based on statistics or guidelines that don't always take into account patient  variability or the natural hx of the dz in every individual patient, which may well vary over time.  For now therefore  >>>>  recommend the patient maintain  Ceiling of 20 but doing so well now would rec gradual taper off and if flares again could try daliresp   I had an extended discussion with the patient/wife reviewing all relevant studies completed to date and  lasting 15 to 20 minutes of a 25 minute visit    See device teaching which extended face to face time for this visit.  Each maintenance medication was reviewed in detail including emphasizing most importantly the difference between maintenance and prns and under what circumstances the prns are to be triggered using an action plan format that is not reflected in the computer generated alphabetically organized AVS which I have not found useful in most complex patients, especially with respiratory illnesses  Please see AVS for specific instructions unique to this visit that I personally wrote and verbalized to the the pt in detail and then reviewed with pt  by my nurse highlighting any  changes in therapy recommended at today's visit to their  plan of care.         Instructions from OV 6/23  Prednisone 10 mg with bfast x one week then 1/2 x one week then 1/2 on even days x 2 weeks and stop  At any point get worse, increase back to 20 mg daily and start over  Please remember to go to the lab department   for your tests - we will call you with the results when they are available.       Please schedule a follow up office visit in 6 weeks, call sooner if needed      Pt called requesting to know if he was still needing to take famotidine. Dr. Melvyn Novas, please advise on this for pt. Thanks!

## 2019-07-06 NOTE — Telephone Encounter (Signed)
Continue until next ov and we'll address it then.  If doing great and having trouble accessing it then ok to leave off to see what effects if any he notices nocturnally or during 1st hour of waking in am

## 2019-07-06 NOTE — Telephone Encounter (Signed)
Called and spoke with pt letting him know the info stated by MW. Pt verbalized understanding. Nothing further needed. 

## 2019-07-08 ENCOUNTER — Ambulatory Visit: Payer: Self-pay

## 2019-07-09 ENCOUNTER — Ambulatory Visit (HOSPITAL_COMMUNITY): Payer: Medicare Other | Attending: Internal Medicine

## 2019-07-09 ENCOUNTER — Other Ambulatory Visit: Payer: Self-pay

## 2019-07-09 DIAGNOSIS — I480 Paroxysmal atrial fibrillation: Secondary | ICD-10-CM | POA: Insufficient documentation

## 2019-07-16 DIAGNOSIS — D638 Anemia in other chronic diseases classified elsewhere: Secondary | ICD-10-CM | POA: Diagnosis not present

## 2019-07-16 DIAGNOSIS — E782 Mixed hyperlipidemia: Secondary | ICD-10-CM | POA: Diagnosis not present

## 2019-07-16 DIAGNOSIS — R7301 Impaired fasting glucose: Secondary | ICD-10-CM | POA: Diagnosis not present

## 2019-07-20 DIAGNOSIS — J410 Simple chronic bronchitis: Secondary | ICD-10-CM | POA: Diagnosis not present

## 2019-07-20 DIAGNOSIS — E663 Overweight: Secondary | ICD-10-CM | POA: Diagnosis not present

## 2019-07-20 DIAGNOSIS — Z6825 Body mass index (BMI) 25.0-25.9, adult: Secondary | ICD-10-CM | POA: Diagnosis not present

## 2019-07-20 DIAGNOSIS — R7301 Impaired fasting glucose: Secondary | ICD-10-CM | POA: Diagnosis not present

## 2019-07-20 DIAGNOSIS — I4819 Other persistent atrial fibrillation: Secondary | ICD-10-CM | POA: Diagnosis not present

## 2019-07-20 DIAGNOSIS — E782 Mixed hyperlipidemia: Secondary | ICD-10-CM | POA: Diagnosis not present

## 2019-07-24 DIAGNOSIS — C44519 Basal cell carcinoma of skin of other part of trunk: Secondary | ICD-10-CM | POA: Diagnosis not present

## 2019-07-24 DIAGNOSIS — C44629 Squamous cell carcinoma of skin of left upper limb, including shoulder: Secondary | ICD-10-CM | POA: Diagnosis not present

## 2019-07-24 DIAGNOSIS — D045 Carcinoma in situ of skin of trunk: Secondary | ICD-10-CM | POA: Diagnosis not present

## 2019-07-24 DIAGNOSIS — L570 Actinic keratosis: Secondary | ICD-10-CM | POA: Diagnosis not present

## 2019-07-26 ENCOUNTER — Other Ambulatory Visit: Payer: Self-pay

## 2019-07-26 ENCOUNTER — Encounter: Payer: Self-pay | Admitting: Internal Medicine

## 2019-07-26 ENCOUNTER — Ambulatory Visit (INDEPENDENT_AMBULATORY_CARE_PROVIDER_SITE_OTHER): Payer: Medicare Other | Admitting: Internal Medicine

## 2019-07-26 DIAGNOSIS — J449 Chronic obstructive pulmonary disease, unspecified: Secondary | ICD-10-CM

## 2019-07-26 DIAGNOSIS — J9611 Chronic respiratory failure with hypoxia: Secondary | ICD-10-CM

## 2019-07-26 DIAGNOSIS — I2510 Atherosclerotic heart disease of native coronary artery without angina pectoris: Secondary | ICD-10-CM

## 2019-07-26 DIAGNOSIS — J441 Chronic obstructive pulmonary disease with (acute) exacerbation: Secondary | ICD-10-CM | POA: Diagnosis not present

## 2019-07-26 DIAGNOSIS — S22060S Wedge compression fracture of T7-T8 vertebra, sequela: Secondary | ICD-10-CM

## 2019-07-26 DIAGNOSIS — I2584 Coronary atherosclerosis due to calcified coronary lesion: Secondary | ICD-10-CM | POA: Diagnosis not present

## 2019-07-26 MED ORDER — SPIRIVA RESPIMAT 2.5 MCG/ACT IN AERS: 2.0000 | INHALATION_SPRAY | Freq: Every day | RESPIRATORY_TRACT | 0 refills | Status: DC

## 2019-07-26 MED ORDER — PREDNISONE 10 MG PO TABS: ORAL_TABLET | ORAL | 0 refills | Status: DC

## 2019-07-26 NOTE — Patient Instructions (Signed)
Prednisone 10 mg ceiling and 5 mg (one half) floor for now   Plan A  = symbicort, spiriva and prednisone as above  Plan B Only use your albuterol as a rescue medication to be used if you can't catch your breath by resting or doing a relaxed purse lip breathing pattern.  - The less you use it, the better it will work when you need it. - Ok to use up to 2 puffs  every 4 hours if you must but call for immediate appointment if use goes up over your usual need - Don't leave home without it !!  (think of it like the spare tire for your car)   Plan C  Use the nebulizer up to every 4 hours if Plan B doesn't work    Please schedule a follow up visit in 3 months but call sooner if needed   .

## 2019-07-26 NOTE — Progress Notes (Signed)
[name], male    DOB: Apr 02, 1946,    MRN: [id number]   Brief patient profile:  27 yowm MM/Quit smoking  02/03/09  with GOLD III spirometry baseline doe x able to golf. Only using 02 at  [facility] with  maint on symbicort/ spiriva and no chronic pred/ albuterol twice daily at most  much worse mid March 2020.   PFT 05/29/16: FVC 2.08 L (86%) FEV1 0.83 L (31%) FEV1/FVC 0.40 FEF 25-75 0.30 L (14%) no bronchodilator response TLC 6.56 L (107%) RV 167% ERV 160% DLCO corrected 60% (hemoglobin 10.9) 07/15/13: FVC 2.68 L (60%) FEV1 1.63 L (53%) FEV1/FVC 0.61 FEF 25-75 0.76 L (26%) 02/04/12: FVC 2.18 L (57%) FEV1 1.09 L (36%) FEV1/FVC 0.50 FEF 25-75 0.33 L (11%)   Admit date: 03/19/2019 Discharge date: 03/25/2019   Recommendations for Outpatient Follow-up:  1. F/u with PCP within a week  for hospital discharge follow up, repeat cbc/bmp at follow up 2. F/u with cardiology for new diagnosis of afib 3. Home 02  Discharge Diagnoses:      Active Hospital Problems   Diagnosis Date Noted   PAF (paroxysmal atrial fibrillation) (HCC)    Acute on chronic respiratory failure with hypoxia (HCC)    COPD exacerbation ([place]) 03/19/2019         Filed Weights   03/23/19 1700 03/24/19 0503 03/25/19 0528  Weight: 60.2 kg 60 kg 62.3 kg    History of present illness: (per admitting MD Dr Earnest Conroy) PCP:Cox, Elnita Maxwell, MD Patient coming from:Home  Chief Complaint:Worsening shortness of breath  YQM:VHQI [name] a 73 y.o.malewith history h/oCOPD, hyperlipidemia whoat baseline was using 2 L O2 via nasal cannula only at night and follows [place] pulmonology (Dr. Vaughan Browner) as outpatient presents with worsening shortness of breath. Patient reports that at baseline he has cough with white phlegm and is able to walk around doing his ADLs without shortness of breath and uses O2 only at night. 2 weeks back he developed productive cough with green phlegm and shortness of breath but no fevers. He  underwent CT chest with contrast on March 18 which was negative for PE or infiltrates. He apparently was given doxycycline/prednisone course at that time which helped him transiently but symptoms worsened again past Monday with shortness of breath on minimum exertion/cough and he noticed his pulse ox was dropping to 74%.He started using 2 L O2 nasal cannula continuous. On Tuesday, he called pulmonary office given persistent symptoms and also pulse oximetry showing O2 sats84% on exertion and 93% at rest in spite of using continuous O2. He was advised by Dr. Vaughan Browner to use 60 mg prednisone x3 days followed by 10 mg taper every 3 days. He was also advised to use nebulizer treatments every 3 hours and continue Symbicort inhaler twice daily. Patient was supposed to start 50 mg prednisone today but woke up feeling extremely short of breath. He states he is also been having bad bouts of cough at night with wheezing and bronchospasm.  He presented to [place] ED where he was noted to be afebrile but hypoxic, required BiPAP for 3 hours, chest x-ray was negative for infiltrates. Patient denies any recent history of travel or known sick contacts with flu positive or coronavirus positive patients. He lives at home with his wife who is recovering from lung cancer and completed chemotherapy 1 month back.  Hospitalist service was called to request direct admission to medical floor but ED to ED transfer facilitated for thorough clinical evaluation prior  to requesting medical bed given coronavirus pandemic. In the ED at [facility], patient has been saturating well on 3 L nasal cannula but appears tachypneic on talking full sentences. He denies any chest pain, denies any fevers or leg swellings.  Hospital Course:  Active Problems:   COPD exacerbation (HCC)   PAF (paroxysmal atrial fibrillation) (HCC)   Acute on chronic respiratory failure with hypoxia (HCC)   Acute onHypoxic respiratory failure,  COPDexacerbation,Progression of severe COPD -Emphysema, with no evidence of superimposed acute cardiopulmonary disease -improving on iv steroids, oral zitrhomax, he finished total of 5 days of zithromax in the hospital, he is discharged on slow prednisone taper.  - o2 dropped to 86% while ambulating on room air, discharged on continuous home o2 ( previously on nighttime o2 only) -he is to follow with Dr Vaughan Browner    Paroxsymalafib, New diagnosis of afib -he is started on eliquis (CHADS-VASC 2, age and aortic atherosclerosis), cardizem, lasix -cardiology consulted recommend "Diltiazem CD 360. Avoid amiodarone and beta blockers given his underlying lung dz." -outpatient echo per cardiology   Aortic and multivessel coronary atherosclerosis - Changed to high intensity statin, atorvastatin 40mg  PO QD. Check lipids and ALT as outpatient in 3 months.  - ASA 81. Aggressive secondary prevention. - No chest pain.      History of Present Illness  03/29/2019  Pulmonary/ extended post hosp f/u eval/Justyn Langham  Re GOLD III copd ? 02 dep now Chief Complaint  Patient presents with   Hospitalization Follow-up    Breathing has improved some. He has not used his albuterol inhaler or neb since hospital d/c.  Dyspnea:  mb and back ok off 02 sats upper 80s at end  Cough: none Sleep: recliner x 45 degrees = baseline 02 2lpm hs  SABA use: none  Since d/c on symb 160 2bid and spiriva each pm (technique 50% on both, see a/p) On pred 50 mg daily on day as ov rec Plan A = Automatic = symbicort 160 Take 2 puffs first thing in am and then another 2 puffs about 12 hours later.  Spiriva x 2 puffs  right after symbicort  Work on inhaler technique: Plan B = Backup Only use your albuterol inhaler(Proventil/Proair)  as a rescue medication Plan C = Crisis - only use your albuterol nebulizer if you first try Plan B and it fails to help > ok to use the nebulizer up to every 4 hours but if start needing it  regularly call for immediate appointment Plan D = Doctor - call me if B and C not adequate Plan E = ER - go to ER or call 911 if all else fails   02  2lpm at bedtime and adjust to keep it over 90% with activity       06/15/2019  f/u ov/Eldora Napp re:  COPD III  maint symb/spiriva on prednisone still 15 mg daily  Chief Complaint  Patient presents with   Follow-up    Breathing has improved  Dyspnea:  10 min x 3 x daily  Due to back / no hills sats usually lower 90s RA Cough: no  Sleeping: 10 degrees SABA use: none 02: 2lpm hs  rec Prednisone 10 mg with bfast x one week then 1/2 x one week then 1/2 on even days x 2 weeks and stop At any point get worse, increase back to 20 mg daily and start over   07/26/2019  f/u ov/Selma Mink re: copd III/ maint pred 10 mg daily  -tends to flare  on lower doses.  Despite maintenance with Symbicort and Spiriva. Chief Complaint  Patient presents with   Follow-up    Had increased SOB when weened off pred so started back on 10 mg daily and this has helped. He is using his proair 4 x per wk on average and uses neb once per day.  Dyspnea:  10 -12 min outside and inside slowed both by back and breathing  Cough: better / min mucoid sputum production even in am Sleeping: flat  SABA use: way too much  - does not rechallenge  02: 02 2lpm hs    No obvious day to day or daytime variability or assoc excess/ purulent sputum or mucus plugs or hemoptysis or cp or chest tightness, subjective wheeze or overt sinus or hb symptoms.   Sleeping fine  without nocturnal  or early am exacerbation  of respiratory  c/o's or need for noct saba. Also denies any obvious fluctuation of symptoms with weather or environmental changes or other aggravating or alleviating factors except as outlined above   No unusual exposure hx or h/o childhood pna/ asthma or knowledge of premature birth.  Current Allergies, Complete Past Medical History, Past Surgical History, Family History, and Social  History were reviewed in [employer] record.  ROS  The following are not active complaints unless bolded Hoarseness, sore throat, dysphagia, dental problems, itching, sneezing,  nasal congestion or discharge of excess mucus or purulent secretions, ear ache,   fever, chills, sweats, unintended wt loss or wt gain, classically pleuritic or exertional cp,  orthopnea pnd or arm/hand swelling  or leg swelling better, presyncope, palpitations, abdominal pain, anorexia, nausea, vomiting, diarrhea  or change in bowel habits or change in bladder habits, change in stools or change in urine, dysuria, hematuria,  rash, arthralgias/back pain , visual complaints, headache, numbness, weakness or ataxia or problems with walking or coordination,  change in mood or  memory.        Current Meds  Medication Sig   albuterol (PROAIR HFA) 108 (90 Base) MCG/ACT inhaler Inhale 2 puffs into the lungs every 6 (six) hours as needed for wheezing or shortness of breath.   albuterol (PROVENTIL) (2.5 MG/3ML) 0.083% nebulizer solution Take 3 mLs (2.5 mg total) by nebulization every 6 (six) hours as needed for wheezing or shortness of breath.   apixaban (ELIQUIS) 5 MG TABS tablet Take 1 tablet (5 mg total) by mouth 2 (two) times daily.   atorvastatin (LIPITOR) 40 MG tablet Take 1 tablet (40 mg total) by mouth daily after supper.   budesonide-formoterol (SYMBICORT) 160-4.5 MCG/ACT inhaler Inhale 2 puffs into the lungs 2 (two) times a day.   Calcium Carbonate-Vit D-Min (CALCIUM 600+D PLUS MINERALS) 600-400 MG-UNIT TABS Take by mouth daily.   diltiazem (CARDIZEM CD) 360 MG 24 hr capsule Take 1 capsule (360 mg total) by mouth daily.   furosemide (LASIX) 20 MG tablet Take 1 tablet (20 mg total) by mouth daily.   loratadine (CLARITIN) 10 MG tablet Take 10 mg by mouth daily.   metoprolol tartrate (LOPRESSOR) 25 MG tablet Take 1 tablet (25 mg total) by mouth 2 (two) times daily.   OXYGEN Inhale 2 L into the  lungs continuous. With sleep only  Adapt   predniSONE (DELTASONE) 10 MG tablet Take 4 tabs daily for 3 days, then 3 tabs daily for 3 days, then 2 tabs daily for 3 days, then 1 tab daily for 3 days   Respiratory Therapy Supplies (FLUTTER) DEVI Use as  directed (Patient taking differently: as directed. )   Tiotropium Bromide Monohydrate (SPIRIVA RESPIMAT) 2.5 MCG/ACT AERS Inhale 2 puffs into the lungs daily.              Objective:     07/26/2019          144   06/15/19 136 lb (61.7 kg)  05/29/19 134 lb 7.7 oz (61 kg)  05/28/19 134 lb 3.2 oz (60.9 kg)      amb wm nad  Vital signs reviewed - Note on arrival 02 sats  98% on RA        HEENT:  Full dentures/  Nl oropharynx. Nl external ear canals without cough reflex -  Mild bilateral non-specific turbinate edema     NECK :  without JVD/Nodes/TM/ nl carotid upstrokes bilaterally   LUNGS: no acc muscle use,  Mod barrel  contour chest wall with bilateral  Distant bs s audible wheeze and  without cough on insp or exp maneuver and mod  Hyperresonant  to  percussion bilaterally     CV:  RRR  no s3 or murmur or increase in P2, and trace sym ankle edema   ABD:  soft and nontender with pos mid insp Hoover's  in the supine position. No bruits or organomegaly appreciated, bowel sounds nl  MS:     ext warm without deformities, calf tenderness, cyanosis or clubbing No obvious joint restrictions   SKIN: warm and dry without lesions    NEURO:  alert, approp, nl sensorium with  no motor or cerebellar deficits apparent.                 Assessment

## 2019-07-27 ENCOUNTER — Encounter: Payer: Self-pay | Admitting: Internal Medicine

## 2019-07-27 NOTE — Assessment & Plan Note (Addendum)
Quit smoking 01/2009  02/04/12 [name]: FEV1 36% FVC 57% FEF 25 75    11% -05/29/16  FEV1 0.83 L (31%) FEV1/FVC 0.40 FEF 25-75 0.30 L (14%) no bronchodilator response p ? rx prior, DLCO corrected 60% (hemoglobin 10.9) - 03/29/2019   continue symb 160 2bid and spiriva smi 2 puffs each am  - 04/15/2019  After extensive coaching inhaler device,  effectiveness =    90% with both hfa and smi but not understanding contingencies for hfa/ neb  - 04/15/2019 placed on daily pred with ceiling 40 and floor of 10 mg daily and empirical rx for GERD - 04/15/2019 flutter valve training  - alpha one screen 06/15/2019  MM  Level 178 - 06/15/2019  After extensive coaching inhaler device,  effectiveness =    90% > so try to taper pred off using ceiling of 20 if worse > 07/26/2019 changed to ceiling of 10 and floor of 5 mg    Despite excellent apparent adherence and use of his inhalers he continues to be steroid dependent complicated him afraid by osteoporosis with a T spine fracture noted.  The goal with a chronic steroid dependent illness is always arriving at the lowest effective dose that controls the disease/symptoms and not accepting a set "formula" which is based on statistics or guidelines that don't always take into account patient  variability or the natural hx of the dz in every individual patient, which may well vary over time.    >>> For now therefore I recommend the patient maintain a cemented 10 mg and a 5 mg floor for now and defer management of osteoporosis issues to Dr. Tobie Poet. (see separate a/p)    >>> I spent extra time with pt today reviewing appropriate use of albuterol for prn use on exertion with the following points: 1) saba is for relief of sob that does not improve by walking a slower pace or resting but rather if the pt does not improve after trying this first. 2) If the pt is convinced, as many are, that saba helps recover from activity faster then it's easy to tell if this is the case by re-challenging  : ie stop, take the inhaler, then p 5 minutes try the exact same activity (intensity of workload) that just caused the symptoms and see if they are substantially diminished or not after saba 3) if there is an activity that reproducibly causes the symptoms, try the saba 15 min before the activity on alternate days   If in fact the saba really does help, then fine to continue to use it prn but advised may need to look closer at the maintenance regimen being used to achieve better control of airways disease with exertion.

## 2019-07-27 NOTE — Assessment & Plan Note (Signed)
02/06/12 ONO RA: desat <88% 7 episodes per hour, 66 episodes total 01/27/2013 ONO RA desat < 88% 51min of recording , still needs nocturnal oxygen - 03/25/2019 d/c from admit on 2lpm but not using x at hs  - 04/15/2019 02 sats 94% at rest despite flare in copd so rec 2lpm  Hs and prn activity  to keep sats > 90% at all times   As of  07/27/2019 2lpm hs and prn daytime with goal of keeping saturations greater than 90% with activity which is also of course limited by chronic back pain.

## 2019-07-27 NOTE — Assessment & Plan Note (Signed)
T8 compression fracture noted on CT scan 05/28/2019 inpatient with chronic steroid dependent COPD  I believe he would benefit from treatment for osteoporosis but will defer the details to Dr. Tobie Poet is capable hands that she has more experience with this than I do.  He would be a good candidate for kyphoplasty per IR  if needed.   I had an extended discussion with the patient reviewing all relevant studies completed to date and  lasting 15 to 20 minutes of a 25 minute visit    Each maintenance medication was reviewed in detail including most importantly the difference between maintenance and prns and under what circumstances the prns are to be triggered using an action plan format that is not reflected in the computer generated alphabetically organized AVS.     Please see AVS for specific instructions unique to this visit that I personally wrote and verbalized to the the pt in detail and then reviewed with pt  by my nurse highlighting any  changes in therapy recommended at today's visit to their plan of care.

## 2019-07-29 ENCOUNTER — Other Ambulatory Visit: Payer: Self-pay

## 2019-07-29 NOTE — Patient Outreach (Signed)
Case closure:  Placed call to patient who reports that he is fine. Reports he saw pulmonary yesterday.  Reports he will follow up with pulmonary again in 3 months.  Reports that he does not need anything and does not want me to call back.    PLAN: patient declines any other needs and request case closure: Will send letters of case closure to patient and MD.  Tomasa Rand, RN, BSN, CEN [place] Coordinator [phone]

## 2019-08-16 DIAGNOSIS — R079 Chest pain, unspecified: Secondary | ICD-10-CM | POA: Diagnosis not present

## 2019-08-16 DIAGNOSIS — M546 Pain in thoracic spine: Secondary | ICD-10-CM | POA: Diagnosis not present

## 2019-08-16 DIAGNOSIS — R0602 Shortness of breath: Secondary | ICD-10-CM | POA: Diagnosis not present

## 2019-08-16 DIAGNOSIS — S22060S Wedge compression fracture of T7-T8 vertebra, sequela: Secondary | ICD-10-CM | POA: Diagnosis not present

## 2019-08-16 DIAGNOSIS — J449 Chronic obstructive pulmonary disease, unspecified: Secondary | ICD-10-CM | POA: Diagnosis not present

## 2019-08-17 DIAGNOSIS — C44629 Squamous cell carcinoma of skin of left upper limb, including shoulder: Secondary | ICD-10-CM | POA: Diagnosis not present

## 2019-08-23 ENCOUNTER — Telehealth: Payer: Self-pay | Admitting: Cardiology

## 2019-08-23 NOTE — Telephone Encounter (Signed)
° ° ° °  Pt c/o medication issue:  1. Name of Medication:diltiazem (CARDIZEM CD) 360 MG 24 hr capsule  2. How are you currently taking this medication (dosage and times per day)? As written  3. Are you having a reaction (difficulty breathing--STAT)? no  4. What is your medication issue? Medication on back order for more than 1 week. Can medication be changed?

## 2019-08-24 MED ORDER — DILTIAZEM HCL ER BEADS 360 MG PO CP24: 360.0000 mg | ORAL_CAPSULE | Freq: Every day | ORAL | 6 refills | Status: DC

## 2019-08-24 NOTE — Telephone Encounter (Signed)
Spoke with patient who reports the pharmacy can not obtain his Diltiazem 360 mg and they do not know when they will receive any.  Called and spoke with the pharmacist at [facility].  They do have Tiazac XR available.  Per Dr Marlou Porch, this will be ok to substitute with.  RX will be sent into pharmacy.    Pt aware of the above.

## 2019-08-25 ENCOUNTER — Telehealth: Payer: Self-pay | Admitting: Cardiology

## 2019-08-25 NOTE — Telephone Encounter (Signed)
Jeani Hawking, [place], please address this. Thanks

## 2019-08-25 NOTE — Telephone Encounter (Signed)
There are no current diltiazem shortages noted by the FDA except for an injectable diltiazem shortage that was supposed to resolve in March of this year. This may be a Publishing copy to [employer]. Agree that Tiazac is an appropriate substitute.

## 2019-08-25 NOTE — Telephone Encounter (Signed)
**Note De-Identified Cainen Burnham Obfuscation** Please see note below:   August 24, 2019 Shellia Cleverly, RN     2:37 PM Note   Spoke with patient who reports the pharmacy can not obtain his Diltiazem 360 mg and they do not know when they will receive any.  Called and spoke with the pharmacist at [facility].  They do have Tiazac XR available.  Per Dr Marlou Porch, this will be ok to substitute with.  RX will be sent into pharmacy.    Pt aware of the above.    August 23, 2019  Is there a Diltiazem shortage at this time?

## 2019-08-25 NOTE — Telephone Encounter (Signed)
The pts wife [name] states that they already paid $120/30 day supply this morning at [facility] as the pt was out of Diltiazem.  She is aware that I did the PA for Diltiazem (Tiazac) and am waiting on reply from the pts INS provider. She is aware to contact the pts INS provider if this PA is approved and asked that they notify the pharmacy so she can get refund if possible per [facility].

## 2019-08-25 NOTE — Telephone Encounter (Signed)
New message   Pt c/o medication issue:  1. Name of Medication:   diltiazem (TIAZAC) 360 MG 24 hr capsule   2. How are you currently taking this medication (dosage and times per day)? n/a  3. Are you having a reaction (difficulty breathing--STAT)? n/a  4. What is your medication issue? Patient's wife states that per the insurance company that will need prior authorization to get this medication filled.  Please advise.

## 2019-08-25 NOTE — Telephone Encounter (Signed)
Because Diltiazem (Cardizem) is out of stock at the pts pharmacy I did a Tiazac PA through covermymeds. Key: [id number]

## 2019-08-27 NOTE — Telephone Encounter (Signed)
**Note De-Identified D[employer]a Countess Obfuscation** Letter received from [facility] stating that the pt must first try and fail formulary Propranolol and Varapamil before they will consider covering non-formulary Diltiazem (Tiazac).  Will forward this message to Dr Marlou Porch and his nurse for advisement to the pt.

## 2019-09-09 NOTE — Telephone Encounter (Signed)
Called and spoke with [place] [facility] in [place].   She is aware the only reason this pt was changed from Diltiazem CD 360 mg to Tiazac is d/t the fact that their store does not have it in stock and it is on backorder.  There is no Producer, television/film/video of this medication.   Advised her it is not fair to the patient to make him pay $120 out of pocket for a 30 day supply of his medication because what they do have on hand (Tiazac) is not on his formulary.She reports today it looks like they should be receiving his medication by the end of the month and it will be filled with Diltiazem CD and not Tiazac.   Spoke with wife [name], who is aware of the above.  She will call me if there is a problem obtaining the Diltiazem CD 360 mg with his next refill.  Wife was very appreciative of the call.

## 2019-09-10 DIAGNOSIS — M85851 Other specified disorders of bone density and structure, right thigh: Secondary | ICD-10-CM | POA: Diagnosis not present

## 2019-09-10 DIAGNOSIS — M810 Age-related osteoporosis without current pathological fracture: Secondary | ICD-10-CM | POA: Diagnosis not present

## 2019-09-20 ENCOUNTER — Telehealth: Payer: Self-pay

## 2019-09-20 NOTE — Telephone Encounter (Signed)
Late entry. The pts BMS pt asst application for Eliquis (Provider part) was left at the office. We have completed the provider part of the application, Dr Lovena Le (DOD) signed it and we have mailed it back to the pt as requested on Friday 09/17/2019.

## 2019-09-24 ENCOUNTER — Telehealth: Payer: Self-pay | Admitting: *Deleted

## 2019-09-24 MED ORDER — DILTIAZEM HCL ER COATED BEADS 360 MG PO CP24: 360.0000 mg | ORAL_CAPSULE | Freq: Every day | ORAL | 3 refills | Status: DC

## 2019-09-24 NOTE — Telephone Encounter (Signed)
Spoke with wife who reports [facility] is still saying the Diltiazem is still on back order and they do not know when they will be getting it.  Wife is requesting rx be sent into [facility] in [place].  She also gave me the patient's insurance information in case they are requesting a prior authorization Provider # [id number].  Pt's ID# [id number].  Called and spoke with pharmacy staff at [facility] - medication does not require a PA - 90 day supply is $98.00 and 30 day is $32.97.  Pt's wife is aware and expressed gratitude for the assistance and information.  She will c/b with any further questions or concerns.

## 2019-10-08 ENCOUNTER — Ambulatory Visit (INDEPENDENT_AMBULATORY_CARE_PROVIDER_SITE_OTHER): Payer: Medicare Other | Admitting: Cardiology

## 2019-10-08 ENCOUNTER — Other Ambulatory Visit: Payer: Self-pay

## 2019-10-08 ENCOUNTER — Encounter: Payer: Self-pay | Admitting: Cardiology

## 2019-10-08 VITALS — BP 124/62 | HR 76 | Ht 66.0 in | Wt 152.6 lb

## 2019-10-08 DIAGNOSIS — Z7901 Long term (current) use of anticoagulants: Secondary | ICD-10-CM

## 2019-10-08 DIAGNOSIS — J438 Other emphysema: Secondary | ICD-10-CM

## 2019-10-08 DIAGNOSIS — I2510 Atherosclerotic heart disease of native coronary artery without angina pectoris: Secondary | ICD-10-CM | POA: Diagnosis not present

## 2019-10-08 DIAGNOSIS — I2584 Coronary atherosclerosis due to calcified coronary lesion: Secondary | ICD-10-CM | POA: Diagnosis not present

## 2019-10-08 DIAGNOSIS — I700 Atherosclerosis of aorta: Secondary | ICD-10-CM | POA: Diagnosis not present

## 2019-10-08 DIAGNOSIS — Z79899 Other long term (current) drug therapy: Secondary | ICD-10-CM | POA: Diagnosis not present

## 2019-10-08 DIAGNOSIS — I480 Paroxysmal atrial fibrillation: Secondary | ICD-10-CM

## 2019-10-08 LAB — CBC
Hematocrit: 39.3 % (ref 37.5–51.0)
Hemoglobin: 12.5 g/dL — ABNORMAL LOW (ref 13.0–17.7)
MCH: 25.3 pg — ABNORMAL LOW (ref 26.6–33.0)
MCHC: 31.8 g/dL (ref 31.5–35.7)
MCV: 80 fL (ref 79–97)
Platelets: 319 10*3/uL (ref 150–450)
RBC: 4.94 x10E6/uL (ref 4.14–5.80)
RDW: 13.8 % (ref 11.6–15.4)
WBC: 10.2 10*3/uL (ref 3.4–10.8)

## 2019-10-08 LAB — BASIC METABOLIC PANEL
BUN/Creatinine Ratio: 20 (ref 10–24)
BUN: 17 mg/dL (ref 8–27)
CO2: 29 mmol/L (ref 20–29)
Calcium: 9.5 mg/dL (ref 8.6–10.2)
Chloride: 96 mmol/L (ref 96–106)
Creatinine, Ser: 0.83 mg/dL (ref 0.76–1.27)
GFR calc Af Amer: 100 mL/min/{1.73_m2} (ref >59)
GFR calc non Af Amer: 87 mL/min/{1.73_m2} (ref >59)
Glucose: 166 mg/dL — ABNORMAL HIGH (ref 65–99)
Potassium: 3.8 mmol/L (ref 3.5–5.2)
Sodium: 141 mmol/L (ref 134–144)

## 2019-10-08 MED ORDER — FUROSEMIDE 40 MG PO TABS: 40.0000 mg | ORAL_TABLET | Freq: Every day | ORAL | 3 refills | Status: DC

## 2019-10-08 NOTE — Patient Instructions (Signed)
Medication Instructions:  Please increase Furosemide to 40 mg daily. Continue all other medications as listed.  *If you need a refill on your cardiac medications before your next appointment, please call your pharmacy*  Lab Work: Please have blood work today (CBC, BMP) If you have labs (blood work) drawn today and your tests are completely normal, you will receive your results only by: Marland Kitchen MyChart Message (if you have MyChart) OR . A paper copy in the mail If you have any lab test that is abnormal or we need to change your treatment, we will call you to review the results.  Follow-Up: At [facility], you and your health needs are our priority.  As part of our continuing mission to provide you with exceptional heart care, we have created designated Provider Care Teams.  These Care Teams include your primary Cardiologist (physician) and Advanced Practice Providers (APPs -  Physician Assistants and Nurse Practitioners) who all work together to provide you with the care you need, when you need it.  Your next appointment:   12 months  The format for your next appointment:   In Person  Provider:   You may see Candee Furbish, MD or one of the following Advanced Practice Providers on your designated Care Team:    Truitt Merle, NP  Cecilie Kicks, NP  Kathyrn Drown, NP   Thank you for choosing [facility]!!

## 2019-10-08 NOTE — Progress Notes (Signed)
Cardiology Office Note:    Date:  10/08/2019   ID:  Murtaza Dios, DOB March 19, 1946, MRN [id number]  PCP:  [name], MD  Cardiologist:  Candee Furbish, MD  Electrophysiologist:  None   Referring MD: [name], MD     History of Present Illness:    Varen Paul [name] is a 73 y.o. male with significant COPD, FEV1 36%, atrial fibrillation with prior rapid ventricular response hyperlipidemia pulmonary nodules.  Has been seen in the atrial fibrillation clinic.  Has had COPD exacerbations hospitalizations.  Lower extremity edema with high-dose calcium channel blocker.  Vertebral fracture.  Last was seen via telehealth on 06/16/2019 by Truitt Merle, NP.  Past Medical History:  Diagnosis Date  . A-fib ([place])   . COPD (chronic obstructive pulmonary disease) ([place])   . High cholesterol     Past Surgical History:  Procedure Laterality Date  . CATARACT EXTRACTION Right   . SKIN SURGERY     shoulders and chest    Current Medications: Current Meds  Medication Sig  . albuterol (PROAIR HFA) 108 (90 Base) MCG/ACT inhaler Inhale 2 puffs into the lungs every 6 (six) hours as needed for wheezing or shortness of breath.  Marland Kitchen albuterol (PROVENTIL) (2.5 MG/3ML) 0.083% nebulizer solution Take 3 mLs (2.5 mg total) by nebulization every 6 (six) hours as needed for wheezing or shortness of breath.  Marland Kitchen alendronate (FOSAMAX) 70 MG tablet Take 70 mg by mouth once a week.  Marland Kitchen apixaban (ELIQUIS) 5 MG TABS tablet Take 1 tablet (5 mg total) by mouth 2 (two) times daily.  Marland Kitchen atorvastatin (LIPITOR) 40 MG tablet Take 1 tablet (40 mg total) by mouth daily after supper.  . budesonide-formoterol (SYMBICORT) 160-4.5 MCG/ACT inhaler Inhale 2 puffs into the lungs 2 (two) times a day.  . Calcium Carbonate-Vit D-Min (CALCIUM 600+D PLUS MINERALS) 600-400 MG-UNIT TABS Take by mouth daily.  Marland Kitchen diltiazem (CARDIZEM CD) 360 MG 24 hr capsule Take 1 capsule (360 mg total) by mouth daily.  Marland Kitchen loratadine (CLARITIN) 10 MG tablet Take 10 mg by  mouth daily.  . metoprolol tartrate (LOPRESSOR) 25 MG tablet Take 1 tablet (25 mg total) by mouth 2 (two) times daily.  . OXYGEN Inhale 2 L into the lungs continuous. With sleep only  Adapt  . predniSONE (DELTASONE) 10 MG tablet One daily until better then one half daily  . Respiratory Therapy Supplies (FLUTTER) DEVI Use as directed (Patient taking differently: as directed. )  . Tiotropium Bromide Monohydrate (SPIRIVA RESPIMAT) 2.5 MCG/ACT AERS Inhale 2 puffs into the lungs daily.  . [DISCONTINUED] furosemide (LASIX) 20 MG tablet Take 1 tablet (20 mg total) by mouth daily. (Patient taking differently: Take 40 mg by mouth daily. )  . [DISCONTINUED] furosemide (LASIX) 20 MG tablet Take 40 mg by mouth daily.     Allergies:   Patient has no known allergies.   Social History   Socioeconomic History  . Marital status: Married    Spouse name: Not on file  . Number of children: 3  . Years of education: Not on file  . Highest education level: Not on file  Occupational History  . Occupation: retired    Comment: truck Diplomatic Services operational officer  . Financial resource strain: Not on file  . Food insecurity    Worry: Not on file    Inability: Not on file  . Transportation needs    Medical: Not on file    Non-medical: Not on file  Tobacco Use  . Smoking status:  Former Smoker    Packs/day: 2.00    Years: 52.00    Pack years: 104.00    Types: Cigarettes    Quit date: 02/03/2009    Years since quitting: 10.6  . Smokeless tobacco: Never Used  . Tobacco comment: Counseled to remain smoke free  Substance and Sexual Activity  . Alcohol use: Yes    Alcohol/week: 0.0 standard drinks    Comment: occ  . Drug use: No  . Sexual activity: Not on file  Lifestyle  . Physical activity    Days per week: Not on file    Minutes per session: Not on file  . Stress: Not on file  Relationships  . Social Herbalist on phone: Not on file    Gets together: Not on file    Attends religious service:  Not on file    Active member of club or organization: Not on file    Attends meetings of clubs or organizations: Not on file    Relationship status: Not on file  Other Topics Concern  . Not on file  Social History Narrative   Originally from [place]. Previously worked driving a Restaurant manager, fast food. No pets currently. No mold exposure. 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      Family History: The patient's family history includes Cancer in his paternal uncle; Heart disease in his brother, father, and mother.  ROS:   Please see the history of present illness.     All other systems reviewed and are negative.  EKGs/Labs/Other Studies Reviewed:    The following studies were reviewed today:  04/29/19 14-day monitor Sinus rhythm Occasional premature atrial contractions, premature ventricular contractions Nonsustained atrial tachycardia No sustained arrhythmias  EKG: Previously sinus rhythm.  In the hospital was atrial fibrillation with rapid ventricular response  Recent Labs: 03/22/2019: TSH 2.539 03/25/2019: Magnesium 2.0 05/28/2019: ALT 40; B Natriuretic Peptide 56.6 06/16/2019: BUN 12; Creatinine, Ser 0.80; Hemoglobin 11.3; Platelets 347; Potassium 4.3; Sodium 138  Recent Lipid Panel No results found for: CHOL, TRIG, HDL, CHOLHDL, VLDL, LDLCALC, LDLDIRECT  Physical Exam:    VS:  BP 124/62   Pulse 76   Ht 5\' 6"  (1.676 m)   Wt 152 lb 9.6 oz (69.2 kg)   SpO2 94%   BMI 24.63 kg/m     Wt Readings from Last 3 Encounters:  10/08/19 152 lb 9.6 oz (69.2 kg)  07/26/19 144 lb 9.6 oz (65.6 kg)  06/28/19 138 lb 8 oz (62.8 kg)     GEN:  Well nourished, well developed in no acute distress HEENT: Normal NECK: No JVD; No carotid bruits LYMPHATICS: No lymphadenopathy CARDIAC: RRR, no murmurs, rubs, gallops RESPIRATORY: Decreased air movement bilaterally, no active wheezing ABDOMEN: Soft, non-tender, non-distended MUSCULOSKELETAL:   No edema; No deformity  SKIN: Warm and dry NEUROLOGIC:  Alert and oriented x 3 PSYCHIATRIC:  Normal affect   ASSESSMENT:    1. Paroxysmal atrial fibrillation (HCC)   2. Chronic anticoagulation   3. Atherosclerosis of aorta ([place])   4. Coronary artery calcification   5. Other emphysema ([place])   6. Long-term use of high-risk medication    PLAN:    In order of problems listed above:  Paroxysmal atrial fibrillation -Anticoagulation-Eliquis-expensive for him in the donut hole. -No amiodarone given underlying lung disease -Monitor demonstrated predominantly normal sinus rhythm.  No Tikosyn. -Continue with diltiazem 360 and metoprolol tartrate 25 seems to  doing quite well with this.  He is maintaining sinus rhythm.  Severe COPD -Home oxygen.  Dr. Melvyn Novas.  On chronic prednisone at this point.  When he tries to pull back, his lungs act up.  Multivessel atherosclerosis/aortic atherosclerosis -Atorvastatin 40 mg high intensity dose, Eliquis, aspirin.  Nuclear stress test normal perfusion no ischemia EF 69%.  Excellent.  Occasional lower extremity edema -Calcium channel blocker, elevated right-sided heart pressures from underlying COPD.  Taking now 40 mg of Lasix daily.  We will give him a new prescription.  We will check a basic metabolic profile today.  I will also check a CBC.  1 year follow-up   Medication Adjustments/Labs and Tests Ordered: Current medicines are reviewed at length with the patient today.  Concerns regarding medicines are outlined above.  Orders Placed This Encounter  Procedures  . Basic metabolic panel  . CBC   Meds ordered this encounter  Medications  . furosemide (LASIX) 40 MG tablet    Sig: Take 1 tablet (40 mg total) by mouth daily.    Dispense:  90 tablet    Refill:  3    Patient Instructions  Medication Instructions:  Please increase Furosemide to 40 mg daily. Continue all other medications as listed.  *If you need a refill on your cardiac  medications before your next appointment, please call your pharmacy*  Lab Work: Please have blood work today (CBC, BMP) If you have labs (blood work) drawn today and your tests are completely normal, you will receive your results only by: Marland Kitchen MyChart Message (if you have MyChart) OR . A paper copy in the mail If you have any lab test that is abnormal or we need to change your treatment, we will call you to review the results.  Follow-Up: At [facility], you and your health needs are our priority.  As part of our continuing mission to provide you with exceptional heart care, we have created designated Provider Care Teams.  These Care Teams include your primary Cardiologist (physician) and Advanced Practice Providers (APPs -  Physician Assistants and Nurse Practitioners) who all work together to provide you with the care you need, when you need it.  Your next appointment:   12 months  The format for your next appointment:   In Person  Provider:   You may see Candee Furbish, MD or one of the following Advanced Practice Providers on your designated Care Team:    Truitt Merle, NP  Cecilie Kicks, NP  Kathyrn Drown, NP   Thank you for choosing [facility]!!         Signed, Candee Furbish, MD  10/08/2019 10:04 AM    [place]

## 2019-10-08 NOTE — Telephone Encounter (Signed)
Letter received from [employer] stating they have appoved the pt for pt asst with his Eliquis. Approval good until [id number] Application Case#: [id number]  The letter states that they have notified the pt of this approval.

## 2019-10-11 ENCOUNTER — Telehealth: Payer: Self-pay

## 2019-10-11 NOTE — Telephone Encounter (Signed)
-----   Message from Jerline Pain, MD sent at 10/11/2019  9:57 AM EDT ----- Kidney function stable. Anemia improved. Elevated blood sugar. 166.  Candee Furbish, MD

## 2019-10-11 NOTE — Telephone Encounter (Signed)
Notes recorded by Frederik Schmidt, RN on 10/11/2019 at 10:09 AM EDT  The patient has been notified of the result and verbalized understanding. All questions (if any) were answered.  Frederik Schmidt, RN 10/11/2019 10:09 AM

## 2019-10-19 DIAGNOSIS — R7301 Impaired fasting glucose: Secondary | ICD-10-CM | POA: Diagnosis not present

## 2019-10-19 DIAGNOSIS — E782 Mixed hyperlipidemia: Secondary | ICD-10-CM | POA: Diagnosis not present

## 2019-10-21 DIAGNOSIS — R7301 Impaired fasting glucose: Secondary | ICD-10-CM | POA: Diagnosis not present

## 2019-10-21 DIAGNOSIS — I4819 Other persistent atrial fibrillation: Secondary | ICD-10-CM | POA: Diagnosis not present

## 2019-10-21 DIAGNOSIS — E782 Mixed hyperlipidemia: Secondary | ICD-10-CM | POA: Diagnosis not present

## 2019-10-21 DIAGNOSIS — J410 Simple chronic bronchitis: Secondary | ICD-10-CM | POA: Diagnosis not present

## 2019-10-21 DIAGNOSIS — E663 Overweight: Secondary | ICD-10-CM | POA: Diagnosis not present

## 2019-10-21 DIAGNOSIS — Z6825 Body mass index (BMI) 25.0-25.9, adult: Secondary | ICD-10-CM | POA: Diagnosis not present

## 2019-10-22 ENCOUNTER — Telehealth: Payer: Self-pay | Admitting: Internal Medicine

## 2019-10-22 MED ORDER — PREDNISONE 10 MG PO TABS: ORAL_TABLET | ORAL | 0 refills | Status: DC

## 2019-10-22 NOTE — Telephone Encounter (Signed)
Call returned to patient, per MW okay to refill. Refilled last script given. Voiced understanding. Nothing further needed at this time.

## 2019-10-22 NOTE — Telephone Encounter (Signed)
Yes that's fine 

## 2019-10-22 NOTE — Telephone Encounter (Signed)
Dr. Melvyn Novas, based on the patient request and your last office note dated 07/25/29, can this refill be sent in as you directed (1 tablet until better that 1/2 tablet daily)?   Below is information from your note:  Quit smoking 01/2009  02/04/12 [name]: FEV1 36% FVC 57% FEF 25 75    11% -05/29/16  FEV1 0.83 L (31%) FEV1/FVC 0.40 FEF 25-75 0.30 L (14%) no bronchodilator response p ? rx prior, DLCO corrected 60% (hemoglobin 10.9) - 03/29/2019   continue symb 160 2bid and spiriva smi 2 puffs each am  - 04/15/2019  After extensive coaching inhaler device,  effectiveness =    90% with both hfa and smi but not understanding contingencies for hfa/ neb  - 04/15/2019 placed on daily pred with ceiling 40 and floor of 10 mg daily and empirical rx for GERD - 04/15/2019 flutter valve training  - alpha one screen 06/15/2019  MM  Level 178 - 06/15/2019  After extensive coaching inhaler device,  effectiveness =    90% > so try to taper pred off using ceiling of 20 if worse > 07/26/2019 changed to ceiling of 10 and floor of 5 mg    Despite excellent apparent adherence and use of his inhalers he continues to be steroid dependent complicated him afraid by osteoporosis with a T spine fracture noted.  The goal with a chronic steroid dependent illness is always arriving at the lowest effective dose that controls the disease/symptoms and not accepting a set "formula" which is based on statistics or guidelines that don't always take into account patient  variability or the natural hx of the dz in every individual patient, which may well vary over time.    >>> For now therefore I recommend the patient maintain a cemented 10 mg and a 5 mg floor for now and defer management of osteoporosis issues to Dr. Tobie Poet. (see separate a/p)    >>> I spent extra time with pt today reviewing appropriate use of albuterol for prn use on exertion with the following points: 1) saba is for relief of sob that does not improve by walking a slower pace or  resting but rather if the pt does not improve after trying this first. 2) If the pt is convinced, as many are, that saba helps recover from activity faster then it's easy to tell if this is the case by re-challenging : ie stop, take the inhaler, then p 5 minutes try the exact same activity (intensity of workload) that just caused the symptoms and see if they are substantially diminished or not after saba 3) if there is an activity that reproducibly causes the symptoms, try the saba 15 min before the activity on alternate days   If in fact the saba really does help, then fine to continue to use it prn but advised may need to look closer at the maintenance regimen being used to achieve better control of airways disease with exertion.  Prednisone 10 mg ceiling and 5 mg (one half) floor for now   Plan A  = symbicort, spiriva and prednisone as above  Plan B Only use your albuterol as a rescue medication to be used if you can't catch your breath by resting or doing a relaxed purse lip breathing pattern.  - The less you use it, the better it will work when you need it. - Ok to use up to 2 puffs  every 4 hours if you must but call for immediate appointment if use goes  up over your usual need - Don't leave home without it !!  (think of it like the spare tire for your car)   Plan C  Use the nebulizer up to every 4 hours if Plan B doesn't work

## 2019-10-26 ENCOUNTER — Ambulatory Visit (INDEPENDENT_AMBULATORY_CARE_PROVIDER_SITE_OTHER): Payer: Medicare Other | Admitting: Internal Medicine

## 2019-10-26 ENCOUNTER — Encounter: Payer: Self-pay | Admitting: Internal Medicine

## 2019-10-26 ENCOUNTER — Other Ambulatory Visit: Payer: Self-pay

## 2019-10-26 DIAGNOSIS — J449 Chronic obstructive pulmonary disease, unspecified: Secondary | ICD-10-CM | POA: Diagnosis not present

## 2019-10-26 DIAGNOSIS — I2584 Coronary atherosclerosis due to calcified coronary lesion: Secondary | ICD-10-CM | POA: Diagnosis not present

## 2019-10-26 DIAGNOSIS — I2510 Atherosclerotic heart disease of native coronary artery without angina pectoris: Secondary | ICD-10-CM

## 2019-10-26 DIAGNOSIS — J9611 Chronic respiratory failure with hypoxia: Secondary | ICD-10-CM | POA: Diagnosis not present

## 2019-10-26 MED ORDER — TRELEGY ELLIPTA 100-62.5-25 MCG/INH IN AEPB: 1.0000 | INHALATION_SPRAY | Freq: Every day | RESPIRATORY_TRACT | 0 refills | Status: DC

## 2019-10-26 MED ORDER — TRELEGY ELLIPTA 100-62.5-25 MCG/INH IN AEPB: 1.0000 | INHALATION_SPRAY | Freq: Every day | RESPIRATORY_TRACT | 11 refills | Status: DC

## 2019-10-26 NOTE — Patient Instructions (Signed)
Plan A = Automatic = Always=    Trelegy one click each am (spiriva/ symbicort)   Plan B = Backup (to supplement plan A, not to replace it) Only use your albuterol inhaler as a rescue medication to be used if you can't catch your breath by resting or doing a relaxed purse lip breathing pattern.  - The less you use it, the better it will work when you need it. - Ok to use the inhaler up to 2 puffs  every 4 hours if you must but call for appointment if use goes up over your usual need - Don't leave home without it !!  (think of it like the spare tire for your car)   Plan C = Crisis (instead of Plan B but only if Plan B stops working) - only use your albuterol nebulizer if you first try Plan B and it fails to help > ok to use the nebulizer up to every 4 hours but if start needing it regularly call for immediate appointment  Try prednisone 15 mg daily if possible - increase to 20 mg daily if not doing as well    Please schedule a follow up visit in 3 months but call sooner if needed

## 2019-10-26 NOTE — Progress Notes (Signed)
Paul [name], male    DOB: Dec 05, 1946,    MRN: [id number]   Brief patient profile:  49 yowm MM/Quit smoking  02/03/09  with GOLD III spirometry baseline doe x able to golf. Only using 02 at  [facility] with  maint on symbicort/ spiriva and no chronic pred/ albuterol twice daily at most  much worse mid March 2020.   PFT 05/29/16: FVC 2.08 L (86%) FEV1 0.83 L (31%) FEV1/FVC 0.40 FEF 25-75 0.30 L (14%) no bronchodilator response TLC 6.56 L (107%) RV 167% ERV 160% DLCO corrected 60% (hemoglobin 10.9) 07/15/13: FVC 2.68 L (60%) FEV1 1.63 L (53%) FEV1/FVC 0.61 FEF 25-75 0.76 L (26%) 02/04/12: FVC 2.18 L (57%) FEV1 1.09 L (36%) FEV1/FVC 0.50 FEF 25-75 0.33 L (11%)   Admit date: 03/19/2019 Discharge date: 03/25/2019   Recommendations for Outpatient Follow-up:  1. F/u with PCP within a week  for hospital discharge follow up, repeat cbc/bmp at follow up 2. F/u with cardiology for new diagnosis of afib 3. Home 02  Discharge Diagnoses:      Active Hospital Problems   Diagnosis Date Noted  . PAF (paroxysmal atrial fibrillation) ([place])   . Acute on chronic respiratory failure with hypoxia ([place])   . COPD exacerbation ([place]) 03/19/2019         Filed Weights   03/23/19 1700 03/24/19 0503 03/25/19 0528  Weight: 60.2 kg 60 kg 62.3 kg    History of present illness: (per admitting MD Dr Earnest Conroy) PCP:[name], Paul Maxwell, MD Patient coming from:Home  Chief Complaint:Worsening shortness of breath  [id number] Paul [name] a 73 y.o.malewith history h/oCOPD, hyperlipidemia whoat baseline was using 2 L O2 via nasal cannula only at night and follows [place] [facility] (Dr. Vaughan [name]) as outpatient presents with worsening shortness of breath. Patient reports that at baseline he has cough with white phlegm and is able to walk around doing his ADLs without shortness of breath and uses O2 only at night. 2 weeks back he developed productive cough with green phlegm and shortness of breath but no fevers. He  underwent CT chest with contrast on March 18 which was negative for PE or infiltrates. He apparently was given doxycycline/prednisone course at that time which helped him transiently but symptoms worsened again past Monday with shortness of breath on minimum exertion/cough and he noticed his pulse ox was dropping to 74%.He started using 2 L O2 nasal cannula continuous. On Tuesday, he called pulmonary office given persistent symptoms and also pulse oximetry showing O2 sats84% on exertion and 93% at rest in spite of using continuous O2. He was advised by Dr. Vaughan [name] to use 60 mg prednisone x3 days followed by 10 mg taper every 3 days. He was also advised to use nebulizer treatments every 3 hours and continue Symbicort inhaler twice daily. Patient was supposed to start 50 mg prednisone today but woke up feeling extremely short of breath. He states he is also been having bad bouts of cough at night with wheezing and bronchospasm.  He presented to [facility] ED where he was noted to be afebrile but hypoxic, required BiPAP for 3 hours, chest x-ray was negative for infiltrates. Patient denies any recent history of travel or known sick contacts with flu positive or coronavirus positive patients. He lives at home with his wife who is recovering from lung cancer and completed chemotherapy 1 month back.  Hospitalist service was called to request direct admission to medical floor but ED to ED transfer facilitated for thorough clinical evaluation prior  to requesting medical bed given coronavirus pandemic. In the ED at [facility], patient has been saturating well on 3 L nasal cannula but appears tachypneic on talking full sentences. He denies any chest pain, denies any fevers or leg swellings.  Hospital Course:  Active Problems:   COPD exacerbation (HCC)   PAF (paroxysmal atrial fibrillation) (HCC)   Acute on chronic respiratory failure with hypoxia (HCC)   Acute onHypoxic respiratory failure,  COPDexacerbation,Progression of severe COPD -Emphysema, with no evidence of superimposed acute cardiopulmonary disease -improving on iv steroids, oral zitrhomax, he finished total of 5 days of zithromax in the hospital, he is discharged on slow prednisone taper.  - o2 dropped to 86% while ambulating on room air, discharged on continuous home o2 ( previously on nighttime o2 only) -he is to follow with Dr Paul [name]    Paroxsymalafib, New diagnosis of afib -he is started on eliquis (CHADS-VASC 2, age and aortic atherosclerosis), cardizem, lasix -cardiology consulted recommend "Diltiazem CD 360. Avoid amiodarone and beta blockers given his underlying lung dz." -outpatient echo per cardiology   Aortic and multivessel coronary atherosclerosis - Changed to high intensity statin, atorvastatin 40mg  PO QD. Check lipids and ALT as outpatient in 3 months.  - ASA 81. Aggressive secondary prevention. - No chest pain.      History of Present Illness  03/29/2019  Pulmonary/ extended post hosp f/u eval/Paul [name]  Re GOLD III copd ? 02 dep now Chief Complaint  Patient presents with  . Hospitalization Follow-up    Breathing has improved some. He has not used his albuterol inhaler or neb since hospital d/c.  Dyspnea:  mb and back ok off 02 sats upper 80s at end  Cough: none Sleep: recliner x 45 degrees = baseline 02 2lpm hs  SABA use: none  Since d/c on symb 160 2bid and spiriva each pm (technique 50% on both, see a/p) On pred 50 mg daily on day as ov rec Plan A = Automatic = symbicort 160 Take 2 puffs first thing in am and then another 2 puffs about 12 hours later.  Spiriva x 2 puffs  right after symbicort  Work on inhaler technique: Plan B = Backup Only use your albuterol inhaler(Proventil/Proair)  as a rescue medication Plan C = Crisis - only use your albuterol nebulizer if you first try Plan B and it fails to help > ok to use the nebulizer up to every 4 hours but if start needing it  regularly call for immediate appointment Plan D = Doctor - call me if B and C not adequate Plan E = ER - go to ER or call 911 if all else fails   02  2lpm at bedtime and adjust to keep it over 90% with activity       06/15/2019  f/u ov/Michal Callicott re:  COPD III  maint symb/spiriva on prednisone still 15 mg daily  Chief Complaint  Patient presents with  . Follow-up    Breathing has improved  Dyspnea:  10 min x 3 x daily  Due to back / no hills sats usually lower 90s RA Cough: no  Sleeping: 10 degrees SABA use: none 02: 2lpm hs  rec Prednisone 10 mg with bfast x one week then 1/2 x one week then 1/2 on even days x 2 weeks and stop At any point get worse, increase back to 20 mg daily and start over.   07/26/2019  f/u ov/Ashira Kirsten re: copd III/ maint pred 10 mg daily  -tends to flare  on lower doses.  Despite maintenance with Symbicort and Spiriva. Chief Complaint  Patient presents with  . Follow-up    Had increased SOB when weened off pred so started back on 10 mg daily and this has helped. He is using his proair 4 x per wk on average and uses neb once per day.  Dyspnea:  10 -12 min outside and inside slowed both by back and breathing  Cough: better / min mucoid sputum production even in am Sleeping: flat  SABA use: way too much  - does not rechallenge  02: 02 2lpm hs  rec Prednisone 10 mg ceiling and 5 mg (one half) floor for now  Plan A  = symbicort, spiriva and prednisone as above Plan B Only use your albuterol as a rescue medication Plan C  Use the nebulizer up to every 4 hours if Plan B doesn't work    10/26/2019  f/u ov/Zeek Rostron re: GOLD III/  Steroid dep since early March 2020 on symb/spiriva and pred 20 mg daily  Chief Complaint  Patient presents with  . Follow-up    Breathing is doing well. He uses his proair once per wk on average. He has not used neb recently.   Dyspnea: walking 1.5 - 3 miles a day  Cough: none  Sleeping: hob up 6 inches mattress wedge  SABA use: as above,  rarely needed 02: 2lpm hs/ sats running in high 80's with activity    No obvious day to day or daytime variability or assoc excess/ purulent sputum or mucus plugs or hemoptysis or cp or chest tightness, subjective wheeze or overt sinus or hb symptoms.   Sleeping ok as above without nocturnal  or early am exacerbation  of respiratory  c/o's or need for noct saba. Also denies any obvious fluctuation of symptoms with weather or environmental changes or other aggravating or alleviating factors except as outlined above   No unusual exposure hx or h/o childhood pna/ asthma or knowledge of premature birth.  Current Allergies, Complete Past Medical History, Past Surgical History, Family History, and Social History were reviewed in [employer] record.  ROS  The following are not active complaints unless bolded Hoarseness, sore throat, dysphagia, dental problems, itching, sneezing,  nasal congestion or discharge of excess mucus or purulent secretions, ear ache,   fever, chills, sweats, unintended wt loss or wt gain, classically pleuritic or exertional cp,  orthopnea pnd or arm/hand swelling  or leg swelling, presyncope, palpitations, abdominal pain, anorexia, nausea, vomiting, diarrhea  or change in bowel habits or change in bladder habits, change in stools or change in urine, dysuria, hematuria,  rash, arthralgias, visual complaints, headache, numbness, weakness or ataxia or problems with walking or coordination,  change in mood or  memory.        Current Meds  Medication Sig  . albuterol (PROAIR HFA) 108 (90 Base) MCG/ACT inhaler Inhale 2 puffs into the lungs every 6 (six) hours as needed for wheezing or shortness of breath.  Marland Kitchen albuterol (PROVENTIL) (2.5 MG/3ML) 0.083% nebulizer solution Take 3 mLs (2.5 mg total) by nebulization every 6 (six) hours as needed for wheezing or shortness of breath.  Marland Kitchen alendronate (FOSAMAX) 70 MG tablet Take 70 mg by mouth once a week.  Marland Kitchen apixaban  (ELIQUIS) 5 MG TABS tablet Take 1 tablet (5 mg total) by mouth 2 (two) times daily.  Marland Kitchen atorvastatin (LIPITOR) 40 MG tablet Take 1 tablet (40 mg total) by mouth daily after supper.  . budesonide-formoterol (  SYMBICORT) 160-4.5 MCG/ACT inhaler Inhale 2 puffs into the lungs 2 (two) times a day.  . Calcium Carbonate-Vit D-Min (CALCIUM 600+D PLUS MINERALS) 600-400 MG-UNIT TABS Take by mouth daily.  Marland Kitchen diltiazem (CARDIZEM CD) 360 MG 24 hr capsule Take 1 capsule (360 mg total) by mouth daily.  . furosemide (LASIX) 40 MG tablet Take 1 tablet (40 mg total) by mouth daily.  Marland Kitchen loratadine (CLARITIN) 10 MG tablet Take 10 mg by mouth daily.  . metoprolol tartrate (LOPRESSOR) 25 MG tablet Take 1 tablet (25 mg total) by mouth 2 (two) times daily.  . OXYGEN Inhale 2 L into the lungs continuous. With sleep only  Adapt  . predniSONE (DELTASONE) 10 MG tablet One daily until better then one half daily (Patient taking differently: 2 tablets daily)  . Respiratory Therapy Supplies (FLUTTER) DEVI Use as directed (Patient taking differently: as directed. )  . Tiotropium Bromide Monohydrate (SPIRIVA RESPIMAT) 2.5 MCG/ACT AERS Inhale 2 puffs into the lungs daily.         Objective:    10/26/2019        156  07/26/2019          144   06/15/19 136 lb (61.7 kg)  05/29/19 134 lb 7.7 oz (61 kg)  05/28/19 134 lb 3.2 oz (60.9 kg)    BP 124/60 (BP Location: Left Arm, Cuff Size: Normal)   Pulse 78   Temp 98.6 F (37 C) (Temporal)   Ht 5\' 6"  (1.676 m)   Wt 156 lb (70.8 kg)   SpO2 96%   BMI 25.18 kg/m    amb wm nad on RA     Reports:   Full dentures  HEENT : pt wearing mask not removed for exam due to covid -19 concerns.    NECK :  without JVD/Nodes/TM/ nl carotid upstrokes bilaterally   LUNGS: no acc muscle use,  Mod barrel  contour chest wall with bilateral  Distant bs with min exp  wheezes and  without cough on insp or exp maneuvers and mod  Hyperresonant  to  percussion bilaterally     CV:  RRR  no s3 or  murmur or increase in P2, and no edema   ABD:  soft and nontender with pos mid insp Hoover's  in the supine position. No bruits or organomegaly appreciated, bowel sounds nl  MS:     ext warm without deformities, calf tenderness, cyanosis or clubbing No obvious joint restrictions   SKIN: warm and dry without lesions    NEURO:  alert, approp, nl sensorium with  no motor or cerebellar deficits apparent.              Assessment

## 2019-10-28 ENCOUNTER — Encounter: Payer: Self-pay | Admitting: Internal Medicine

## 2019-10-28 NOTE — Assessment & Plan Note (Signed)
02 hs since 2013 02/06/12 ONO RA: desat <88% 7 episodes per hour, 66 episodes total 01/27/2013 ONO RA desat < 88% 38min of recording , still needs nocturnal oxygen - 03/25/2019 d/c from admit on 2lpm but not using x at hs  - 04/15/2019 02 sats 94% at rest despite flare in copd so rec 2lpm  Hs and prn activity  to keep sats > 90% at all times   As of 10/26/2019   2lpm hs and prn daytime > goal is to keep sats > 90% esp with exertion

## 2019-10-28 NOTE — Assessment & Plan Note (Signed)
Quit smoking 01/2009  02/04/12 [name]: FEV1 36% FVC 57% FEF 25 75    11% -05/29/16  FEV1 0.83 L (31%) FEV1/FVC 0.40 FEF 25-75 0.30 L (14%) no bronchodilator response p ? rx prior, DLCO corrected 60% (hemoglobin 10.9) - 03/29/2019   continue symb 160 2bid and spiriva smi 2 puffs each am  - 04/15/2019  After extensive coaching inhaler device,  effectiveness =    90% with both hfa and smi but not understanding contingencies for hfa/ neb  - 04/15/2019 placed on daily pred with ceiling 40 and floor of 10 mg daily and empirical rx for GERD - 04/15/2019 flutter valve training  - alpha one screen 06/15/2019  MM  Level 178 - 06/15/2019  After extensive coaching inhaler device,  effectiveness =    90% > so try to taper pred off using ceiling of 20 if worse > 07/26/2019 changed to ceiling of 10 and floor of 5 mg  - 10/26/2019  After extensive coaching inhaler device,  effectiveness =    90% with elipta and still needing pred 20 mg daily so try trelegy sample > consider breztri if prefers hfa and insurance covers    Group D in terms of symptom/risk and laba/lama/ICS  therefore appropriate rx at this point >>>  Either trelegy or breztri should be adequate   The goal with a chronic steroid dependent illness is always arriving at the lowest effective dose that controls the disease/symptoms and not accepting a set "formula" which is based on statistics or guidelines that don't always take into account patient  variability or the natural hx of the dz in every individual patient, which may well vary over time.  For now therefore I recommend the patient maintain  Ceiling of 20 then taper to 15 for now

## 2019-12-02 ENCOUNTER — Other Ambulatory Visit: Payer: Self-pay | Admitting: Cardiology

## 2019-12-21 ENCOUNTER — Telehealth: Payer: Self-pay | Admitting: Internal Medicine

## 2019-12-21 MED ORDER — PREDNISONE 10 MG PO TABS: ORAL_TABLET | ORAL | 0 refills | Status: DC

## 2019-12-21 NOTE — Telephone Encounter (Signed)
Called and spoke to pt's wife, [name]. She states the pt had a rough day yesterday. Pt had a sudden increase in SOB to the point of trouble speaking. Pt wore 2lpm O2, did his albuterol neb, and took another pred tablet (totaling 20mg  for the day). Pt is doing much better today but [name] states the pt will likely have to take 20mg  pred daily during the winter as the cold weather bothers his breathing. Pt has been at 10mg  daily for 3 weeks. New rx of pred has been sent to preferred pharmacy.   Will send to MW as Juluis Rainier on pt's status.

## 2019-12-27 ENCOUNTER — Telehealth: Payer: Self-pay | Admitting: Internal Medicine

## 2019-12-27 ENCOUNTER — Telehealth: Payer: Self-pay | Admitting: Cardiology

## 2019-12-27 ENCOUNTER — Other Ambulatory Visit: Payer: Self-pay

## 2019-12-27 ENCOUNTER — Ambulatory Visit (INDEPENDENT_AMBULATORY_CARE_PROVIDER_SITE_OTHER): Payer: Medicare Other | Admitting: Internal Medicine

## 2019-12-27 DIAGNOSIS — I480 Paroxysmal atrial fibrillation: Secondary | ICD-10-CM

## 2019-12-27 DIAGNOSIS — J9611 Chronic respiratory failure with hypoxia: Secondary | ICD-10-CM

## 2019-12-27 DIAGNOSIS — J449 Chronic obstructive pulmonary disease, unspecified: Secondary | ICD-10-CM

## 2019-12-27 MED ORDER — METOPROLOL TARTRATE 25 MG PO TABS: 25.0000 mg | ORAL_TABLET | Freq: Two times a day (BID) | ORAL | 3 refills | Status: DC

## 2019-12-27 MED ORDER — ATORVASTATIN CALCIUM 40 MG PO TABS: ORAL_TABLET | ORAL | 3 refills | Status: DC

## 2019-12-27 MED ORDER — APIXABAN 5 MG PO TABS: 5.0000 mg | ORAL_TABLET | Freq: Two times a day (BID) | ORAL | 3 refills | Status: DC

## 2019-12-27 MED ORDER — FUROSEMIDE 40 MG PO TABS: 40.0000 mg | ORAL_TABLET | Freq: Every day | ORAL | 3 refills | Status: DC

## 2019-12-27 NOTE — Telephone Encounter (Signed)
Called and spoke with Patient's Wife, Paul [name].  Paul [name] stated patient has had 2 episodes of increased HR and O2 sats dropping in past week.  First was last Monday, and again Saturday.  Paul [name] stated HR increased to 100's and sats dropped to low 80's.  Patient used nebs, inhalers, and prn O2 to recover from episode.  Paul [name] stated patient only walked to short distance when sob started.  Paul [name] stated Patient is doing much better today. O2 sats have been in 90's.  Paul [name] stated she is concerned because his recovery time during this last episode took longer then before.  Paul [name] stated they are wanting to discuss with Dr. Melvyn Novas if they need to do something else to prevent these episodes or other treatments during episodes. Patient is scheduled Tele visit with Dr. Melvyn Novas today at 3:45pm.

## 2019-12-27 NOTE — Patient Instructions (Addendum)
Plan A = Automatic = Always=    Trelegy each am   Plan B = Backup (to supplement plan A, not to replace it) Only use your albuterol inhaler as a rescue medication to be used if you can't catch your breath by resting or doing a relaxed purse lip breathing pattern.  - The less you use it, the better it will work when you need it. - Ok to use the inhaler up to 2 puffs  every 4 hours if you must but call for appointment if use goes up over your usual need - Don't leave home without it !!  (think of it like the spare tire for your car)   Plan C = Crisis (instead of Plan B but only if Plan B stops working) - only use your albuterol nebulizer if you first try Plan B and it fails to help > ok to use the nebulizer up to every 4 hours but if start needing it regularly call for immediate appointment  Plan D = Deltasone  - 2 prednisone = 20 mg / day until better then drop to 15 mg daily as tolerated    Make sure you check your oxygen saturations at highest level of activity to be sure it stays over 90% and adjust upward to maintain this level if needed but remember to turn it back to previous settings when you stop (to conserve your supply).   Keep previous appt > call in meantime if needed  Late add:  Prilosec 20 mg Take 30- 60 min before your first and last meals of the day until returns and continue off the fosfamax (not taking it anyway)    GERD (REFLUX)  is an extremely common cause of respiratory symptoms just like yours , many times with no obvious heartburn at all.     It can be treated with medication, but also with lifestyle changes including elevation of the head of your bed (ideally with 6 -8inch blocks under the headboard of your bed),  Smoking cessation, avoidance of late meals, excessive alcohol, and avoid fatty foods, chocolate, peppermint, colas, red wine, and acidic juices such as orange juice.  NO MINT OR MENTHOL PRODUCTS SO NO COUGH DROPS  USE SUGARLESS CANDY INSTEAD (Jolley ranchers  or Stover's or Life Savers) or even ice chips will also do - the key is to swallow to prevent all throat clearing. NO OIL BASED VITAMINS - use powdered substitutes.  Avoid fish oil when coughing.

## 2019-12-27 NOTE — Telephone Encounter (Signed)
Spoke with wife and reviewed medications pt needs sent into new preferred pharmacy.  Sent in electronically as requested.

## 2019-12-27 NOTE — Telephone Encounter (Signed)
Patients wife called in to state that her husband has changed his drug insurance. She has a list of medications that she would like to go over with the nurse that need to be sent to the new pharmacy at [facility] in [place].   [address] [place], [place] [address]

## 2019-12-27 NOTE — Progress Notes (Signed)
Paul [name], male    DOB: Dec 05, 1946,    MRN: [id number]   Brief patient profile:  74 yowm MM/Quit smoking  02/03/09  with GOLD III spirometry baseline doe x able to golf. Only using 02 at  [facility] with  maint on symbicort/ spiriva and no chronic pred/ albuterol twice daily at most  much worse mid March 2020.   PFT 05/29/16: FVC 2.08 L (86%) FEV1 0.83 L (31%) FEV1/FVC 0.40 FEF 25-75 0.30 L (14%) no bronchodilator response TLC 6.56 L (107%) RV 167% ERV 160% DLCO corrected 60% (hemoglobin 10.9) 07/15/13: FVC 2.68 L (60%) FEV1 1.63 L (53%) FEV1/FVC 0.61 FEF 25-75 0.76 L (26%) 02/04/12: FVC 2.18 L (57%) FEV1 1.09 L (36%) FEV1/FVC 0.50 FEF 25-75 0.33 L (11%)   Admit date: 03/19/2019 Discharge date: 03/25/2019   Recommendations for Outpatient Follow-up:  1. F/u with PCP within a week  for hospital discharge follow up, repeat cbc/bmp at follow up 2. F/u with cardiology for new diagnosis of afib 3. Home 02  Discharge Diagnoses:      Active Hospital Problems   Diagnosis Date Noted  . PAF (paroxysmal atrial fibrillation) ([place])   . Acute on chronic respiratory failure with hypoxia ([place])   . COPD exacerbation ([place]) 03/19/2019         Filed Weights   03/23/19 1700 03/24/19 0503 03/25/19 0528  Weight: 60.2 kg 60 kg 62.3 kg    History of present illness: (per admitting MD Dr Earnest Conroy) PCP:Cox, Elnita Maxwell, MD Patient coming from:Home  Chief Complaint:Worsening shortness of breath  [id number] [name] a 74 y.o.malewith history h/oCOPD, hyperlipidemia whoat baseline was using 2 L O2 via nasal cannula only at night and follows [place] [facility] (Dr. Vaughan Browner) as outpatient presents with worsening shortness of breath. Patient reports that at baseline he has cough with white phlegm and is able to walk around doing his ADLs without shortness of breath and uses O2 only at night. 2 weeks back he developed productive cough with green phlegm and shortness of breath but no fevers. He  underwent CT chest with contrast on March 18 which was negative for PE or infiltrates. He apparently was given doxycycline/prednisone course at that time which helped him transiently but symptoms worsened again past Monday with shortness of breath on minimum exertion/cough and he noticed his pulse ox was dropping to 74%.He started using 2 L O2 nasal cannula continuous. On Tuesday, he called pulmonary office given persistent symptoms and also pulse oximetry showing O2 sats84% on exertion and 93% at rest in spite of using continuous O2. He was advised by Dr. Vaughan Browner to use 60 mg prednisone x3 days followed by 10 mg taper every 3 days. He was also advised to use nebulizer treatments every 3 hours and continue Symbicort inhaler twice daily. Patient was supposed to start 50 mg prednisone today but woke up feeling extremely short of breath. He states he is also been having bad bouts of cough at night with wheezing and bronchospasm.  He presented to [facility] ED where he was noted to be afebrile but hypoxic, required BiPAP for 3 hours, chest x-ray was negative for infiltrates. Patient denies any recent history of travel or known sick contacts with flu positive or coronavirus positive patients. He lives at home with his wife who is recovering from lung cancer and completed chemotherapy 1 month back.  Hospitalist service was called to request direct admission to medical floor but ED to ED transfer facilitated for thorough clinical evaluation prior  to requesting medical bed given coronavirus pandemic. In the ED at [facility], patient has been saturating well on 3 L nasal cannula but appears tachypneic on talking full sentences. He denies any chest pain, denies any fevers or leg swellings.  Hospital Course:  Active Problems:   COPD exacerbation (HCC)   PAF (paroxysmal atrial fibrillation) (HCC)   Acute on chronic respiratory failure with hypoxia (HCC)   Acute onHypoxic respiratory failure,  COPDexacerbation,Progression of severe COPD -Emphysema, with no evidence of superimposed acute cardiopulmonary disease -improving on iv steroids, oral zitrhomax, he finished total of 5 days of zithromax in the hospital, he is discharged on slow prednisone taper.  - o2 dropped to 86% while ambulating on room air, discharged on continuous home o2 ( previously on nighttime o2 only) -he is to follow with Dr Vaughan Browner    Paroxsymalafib, New diagnosis of afib -he is started on eliquis (CHADS-VASC 2, age and aortic atherosclerosis), cardizem, lasix -cardiology consulted recommend "Diltiazem CD 360. Avoid amiodarone and beta blockers given his underlying lung dz." -outpatient echo per cardiology   Aortic and multivessel coronary atherosclerosis - Changed to high intensity statin, atorvastatin 40mg  PO QD. Check lipids and ALT as outpatient in 3 months.  - ASA 81. Aggressive secondary prevention. - No chest pain.      History of Present Illness  03/29/2019  Pulmonary/ extended post hosp f/u eval/Paul [name]  Re GOLD III copd ? 02 dep now Chief Complaint  Patient presents with  . Hospitalization Follow-up    Breathing has improved some. He has not used his albuterol inhaler or neb since hospital d/c.  Dyspnea:  mb and back ok off 02 sats upper 80s at end  Cough: none Sleep: recliner x 45 degrees = baseline 02 2lpm hs  SABA use: none  Since d/c on symb 160 2bid and spiriva each pm (technique 50% on both, see a/p) On pred 50 mg daily on day as ov rec Plan A = Automatic = symbicort 160 Take 2 puffs first thing in am and then another 2 puffs about 12 hours later.  Spiriva x 2 puffs  right after symbicort  Work on inhaler technique: Plan B = Backup Only use your albuterol inhaler(Proventil/Proair)  as a rescue medication Plan C = Crisis - only use your albuterol nebulizer if you first try Plan B and it fails to help > ok to use the nebulizer up to every 4 hours but if start needing it  regularly call for immediate appointment Plan D = Doctor - call me if B and C not adequate Plan E = ER - go to ER or call 911 if all else fails   02  2lpm at bedtime and adjust to keep it over 90% with activity       06/15/2019  f/u ov/Thomson Herbers re:  COPD III  maint symb/spiriva on prednisone still 15 mg daily  Chief Complaint  Patient presents with  . Follow-up    Breathing has improved  Dyspnea:  10 min x 3 x daily  Due to back / no hills sats usually lower 90s RA Cough: no  Sleeping: 10 degrees SABA use: none 02: 2lpm hs  rec Prednisone 10 mg with bfast x one week then 1/2 x one week then 1/2 on even days x 2 weeks and stop At any point get worse, increase back to 20 mg daily and start over.   07/26/2019  f/u ov/Aura Bibby re: copd III/ maint pred 10 mg daily  -tends to flare  on lower doses.  Despite maintenance with Symbicort and Spiriva. Chief Complaint  Patient presents with  . Follow-up    Had increased SOB when weened off pred so started back on 10 mg daily and this has helped. He is using his proair 4 x per wk on average and uses neb once per day.  Dyspnea:  10 -12 min outside and inside slowed both by back and breathing  Cough: better / min mucoid sputum production even in am Sleeping: flat  SABA use: way too much  - does not rechallenge  02: 02 2lpm hs  rec Prednisone 10 mg ceiling and 5 mg (one half) floor for now  Plan A  = symbicort, spiriva and prednisone as above Plan B Only use your albuterol as a rescue medication Plan C  Use the nebulizer up to every 4 hours if Plan B doesn't work    10/26/2019  f/u ov/Bobi Daudelin re: GOLD III/  Steroid dep since early March 2020 on symb/spiriva and pred 20 mg daily  Chief Complaint  Patient presents with  . Follow-up    Breathing is doing well. He uses his proair once per wk on average. He has not used neb recently.   Dyspnea: walking 1.5 - 3 miles a day  Cough: none  Sleeping: hob up 6 inches mattress wedge  SABA use: as above,  rarely needed  02: 2lpm hs/ sats running in high 80's with activity  rec Plan A = Automatic = Always=    Trelegy one click each am (spiriva/ symbicort)  Plan B = Backup (to supplement plan A, not to replace it) Only use your albuterol inhaler as a rescue medication Plan C = Crisis (instead of Plan B but only if Plan B stops working) - only use your albuterol nebulizer  Try prednisone 15 mg daily if possible - increase to 20 mg daily if not doing as well    Virtual Visit via Telephone Note 12/27/2019   I connected with Paul [name] on 12/27/19 at  4:05  PM EST by telephone and verified that I am speaking with the correct person using two identifiers.   I discussed the limitations, risks, security and privacy concerns of performing an evaluation and management service by telephone and the availability of in person appointments. I also discussed with the patient that there may be a patient responsible charge related to this service. The patient expressed understanding and agreed to proceed.   History of Present Illness: Dyspnea:  Last walked 3 miles one week 12/19/2019 then 12/20/2019 a few hours after rising walking back into house in am (on  3 week of pred 10/symb/spiriva)  much more sob, used saba and fine 12/21/2019 then same problem 12/25/19 woke up same problem and up to 20 mg daily better p neb again but took longer.   Cough: minimal beige  Sleeping: well on bedblocks and extra pillows  SABA use: back to baseline  02: 2lpm hs and prn daytime    No obvious day to day or daytime variability or assoc excess/ purulent sputum or mucus plugs or hemoptysis or cp or chest tightness, subjective wheeze or overt sinus or hb symptoms.    Also denies any obvious fluctuation of symptoms with weather or environmental changes or other aggravating or alleviating factors except as outlined above.   Meds reviewed/ med reconciliation completed         Observations/Objective: Mild conversational sob  / mildly congested sounding cough / hoarse   Assessment  and Plan: See problem list for active a/p's   Follow Up Instructions: See avs for instructions unique to this ov which includes revised/ updated med list     I discussed the assessment and treatment plan with the patient. The patient was provided an opportunity to ask questions and all were answered. The patient agreed with the plan and demonstrated an understanding of the instructions.   The patient was advised to call back or seek an in-person evaluation if the symptoms worsen or if the condition fails to improve as anticipated.  I provided 24 minutes of non-face-to-face time during this encounter.   Christinia Gully, MD

## 2019-12-28 ENCOUNTER — Telehealth: Payer: Self-pay | Admitting: Cardiology

## 2019-12-28 ENCOUNTER — Telehealth: Payer: Self-pay | Admitting: Internal Medicine

## 2019-12-28 MED ORDER — METOPROLOL TARTRATE 25 MG PO TABS: 50.0000 mg | ORAL_TABLET | Freq: Two times a day (BID) | ORAL | 1 refills | Status: DC

## 2019-12-28 MED ORDER — AZITHROMYCIN 250 MG PO TABS: ORAL_TABLET | ORAL | 0 refills | Status: DC

## 2019-12-28 NOTE — Telephone Encounter (Signed)
Spoke with patient's wife. She verbalized understanding on recommendations. She is aware to call us back if he is not feeling better by the weekend. Zpak has been called in.   Nothing further needed at time of call.

## 2019-12-28 NOTE — Telephone Encounter (Signed)
Let's try increasing metoprolol to 50mg  PO BID. If his breathing gets worse, decrease back to original dose.  Continue Diltiazem   His SOB is COPD driven, which is increasing his heart rate in compensation.   Candee Furbish, MD

## 2019-12-28 NOTE — Telephone Encounter (Signed)
Patient had televisit with Dr Melvyn Novas yesterday 12/27/19.  Called spoke with patient's spouse [name] who reported that 3 hours after patient's telephone visit with Dr Melvyn Novas yesterday, patient "bottomed out" with HR in the 100s and O2 sats in the 80s.  [name] reported no triggers for this.  She stated that during the visit yesterday they reported a prod cough with slight beige mucus, however today patient's mucus is green.  Denies any blood mucus.  [name] reported that patient only slept for 3 hours last night d/t being "so uncomfortable and struggling."  [facility] in [place]  Dr Melvyn Novas please advise, thank you.

## 2019-12-28 NOTE — Telephone Encounter (Signed)
Spoke with wife - DPR on file.  Last week pt had an exacerbation of COPD.  Pt's heart rate started being elevated some then but has been having more episodes of elevated heart rate since.  In the last few days it has stayed between 95-105 bpm.  With this increase the patient is feeling more SOB which is also causing him to feel anxiety.  Wife reports at times when he is up walking around his 02 sat drops into the 80s and his HR elevates more.  She also reports pt now has developed a cough which is producing a dark green sputum.  She is asking about an antibiotic.  Advised to contact Dr Melvyn Novas regarding this.   Wife does not know if the COPD is causing his SOB or if the HR being elevated is causing the SOB.  He has had to use his albuterol nebs (once this AM and once yesterday)which can increase the HR as well. He takes oral prednisone as well but has taken this for some time now.  Pt takes Diltiazem 360 mg daily along with Metoprolol Tartrate 25 mg BID.  Last night after he took the metoprolol, it did not help to lower his HR.  When he takes the diltiazem and metoprolol together in the mornings - it does seem to help.  Wife is asking if pt's medications need to be adjusted to help decrease his HR.  Advised I will forward this information to Dr Marlou Porch for review and c/b with further instructions.

## 2019-12-28 NOTE — Telephone Encounter (Signed)
Wife is aware of orders and instructions.  She will increase Metoprolol to 50 mg BID and call back if SOB becomes worse.  She does not want a new RX sent into the pharmacy at this time but will call when she needs it filled.

## 2019-12-28 NOTE — Telephone Encounter (Signed)
Sorry to hear that rec  d/c fosfamax for now and start prilosec otc one bid until cough completely gone for a week For ocugh mucinex dm 1200 mg every 12 hours  Extra pred 20 mg this pm   zpak  and advise eval either here 12/28/2018  If not improving or ER if worse while waiting for appt

## 2019-12-28 NOTE — Telephone Encounter (Signed)
Patient's wife called again to check on status of call.

## 2019-12-28 NOTE — Telephone Encounter (Signed)
New Message  STAT if HR is under 50 or over 120 (normal HR is 60-100 beats per minute)  1) What is your heart rate? 96  2) Do you have a log of your heart rate readings (document readings)? No  3) Do you have any other symptoms? Coughing , COPD, SOB

## 2019-12-29 ENCOUNTER — Encounter: Payer: Self-pay | Admitting: Internal Medicine

## 2019-12-29 MED ORDER — OMEPRAZOLE 20 MG PO CPDR: DELAYED_RELEASE_CAPSULE | ORAL | Status: DC

## 2019-12-29 NOTE — Assessment & Plan Note (Signed)
02 hs since 2013 02/06/12 ONO RA: desat <88% 7 episodes per hour, 66 episodes total 01/27/2013 ONO RA desat < 88% 54min of recording , still needs nocturnal oxygen - 03/25/2019 d/c from admit on 2lpm but not using x at hs  - 04/15/2019 02 sats 94% at rest despite flare in copd so rec 2lpm  Hs and prn activity  to keep sats > 90% at all times   As of 12/27/2019   2lpm hs and prn daytime > goal is to keep sats > 90% esp with exertion  Advised: Make sure you check your oxygen saturations at highest level of activity to be sure it stays over 90% and adjust upward to maintain this level if needed but remember to turn it back to previous settings when you stop (to conserve your supply).   Each maintenance medication was reviewed in detail including most importantly the difference between maintenance and as needed and under what circumstances the prns are to be used.  Please see AVS for specific  Instructions which are unique to this visit and I personally typed out  which were reviewed in detail over the phone with the patient and a copy provided via MyChart

## 2019-12-29 NOTE — Assessment & Plan Note (Addendum)
Quit smoking 01/2009  02/04/12 [name]: FEV1 36% FVC 57% FEF 25 75    11% -05/29/16  FEV1 0.83 L (31%) FEV1/FVC 0.40 FEF 25-75 0.30 L (14%) no bronchodilator response p ? rx prior, DLCO corrected 60% (hemoglobin 10.9) - 03/29/2019   continue symb 160 2bid and spiriva smi 2 puffs each am  - 04/15/2019  After extensive coaching inhaler device,  effectiveness =    90% with both hfa and smi but not understanding contingencies for hfa/ neb  - 04/15/2019 placed on daily pred with ceiling 40 and floor of 10 mg daily and empirical rx for GERD - 04/15/2019 flutter valve training  - alpha one screen 06/15/2019  MM  Level 178 - 06/15/2019  After extensive coaching inhaler device,  effectiveness =    90% > so try to taper pred off using ceiling of 20 if worse > 07/26/2019 changed to ceiling of 10 and floor of 5 mg  - 10/26/2019  After extensive coaching inhaler device,  effectiveness =    90% with elipta and still needing pred 20 mg daily so try trelegy sample > consider breztri if prefers hfa and insurance covers  - 12/27/2019 added ppi bid ac otc for atypical spells of sob   ? Acid (or non-acid) GERD > always difficult to exclude as up to 75% of pts in some series report no assoc GI/ Heartburn symptoms> rec max (24h)  acid suppression and diet restrictions/ reviewed and instructions given in writing.     Group D in terms of symptom/risk and laba/lama/ICS  therefore appropriate rx at this point >>>  trelegy appropriate here plus prednisone dep for now. The goal with a chronic steroid dependent illness is always arriving at the lowest effective dose that controls the disease/symptoms and not accepting a set "formula" which is based on statistics or guidelines that don't always take into account patient  variability or the natural hx of the dz in every individual patient, which may well vary over time.  For now therefore I recommend the patient maintain  20 mg until better then 15 mg daily    Pt informed of the seriousness of  COVID 19 infection as a direct risk to lung health  and safey and to close contacts and should continue to wear a facemask in public and minimize exposure to public locations but especially avoid any area or activity where non-close contacts are not observing distancing or wearing an appropriate face mask.  I strongly recommended vaccine when offered.

## 2020-01-04 ENCOUNTER — Encounter (HOSPITAL_BASED_OUTPATIENT_CLINIC_OR_DEPARTMENT_OTHER): Payer: Self-pay

## 2020-01-04 ENCOUNTER — Other Ambulatory Visit: Payer: Self-pay

## 2020-01-04 ENCOUNTER — Inpatient Hospital Stay (HOSPITAL_BASED_OUTPATIENT_CLINIC_OR_DEPARTMENT_OTHER)
Admission: EM | Admit: 2020-01-04 | Discharge: 2020-01-11 | DRG: 191 | Disposition: A | Discharge status: Home / Self Care | Payer: Medicare Other | Attending: Internal Medicine | Admitting: Internal Medicine

## 2020-01-04 ENCOUNTER — Emergency Department (HOSPITAL_BASED_OUTPATIENT_CLINIC_OR_DEPARTMENT_OTHER): Payer: Medicare Other

## 2020-01-04 DIAGNOSIS — T380X5A Adverse effect of glucocorticoids and synthetic analogues, initial encounter: Secondary | ICD-10-CM | POA: Diagnosis not present

## 2020-01-04 DIAGNOSIS — J439 Emphysema, unspecified: Secondary | ICD-10-CM | POA: Diagnosis present

## 2020-01-04 DIAGNOSIS — Z79899 Other long term (current) drug therapy: Secondary | ICD-10-CM | POA: Diagnosis not present

## 2020-01-04 DIAGNOSIS — R0602 Shortness of breath: Secondary | ICD-10-CM

## 2020-01-04 DIAGNOSIS — D72829 Elevated white blood cell count, unspecified: Secondary | ICD-10-CM | POA: Diagnosis present

## 2020-01-04 DIAGNOSIS — Z8249 Family history of ischemic heart disease and other diseases of the circulatory system: Secondary | ICD-10-CM

## 2020-01-04 DIAGNOSIS — E782 Mixed hyperlipidemia: Secondary | ICD-10-CM

## 2020-01-04 DIAGNOSIS — Z79890 Hormone replacement therapy (postmenopausal): Secondary | ICD-10-CM

## 2020-01-04 DIAGNOSIS — J441 Chronic obstructive pulmonary disease with (acute) exacerbation: Secondary | ICD-10-CM | POA: Diagnosis not present

## 2020-01-04 DIAGNOSIS — I480 Paroxysmal atrial fibrillation: Secondary | ICD-10-CM | POA: Diagnosis not present

## 2020-01-04 DIAGNOSIS — Z87891 Personal history of nicotine dependence: Secondary | ICD-10-CM | POA: Diagnosis not present

## 2020-01-04 DIAGNOSIS — Z20822 Contact with and (suspected) exposure to covid-19: Secondary | ICD-10-CM | POA: Diagnosis present

## 2020-01-04 DIAGNOSIS — E7800 Pure hypercholesterolemia, unspecified: Secondary | ICD-10-CM | POA: Diagnosis present

## 2020-01-04 DIAGNOSIS — M4854XA Collapsed vertebra, not elsewhere classified, thoracic region, initial encounter for fracture: Secondary | ICD-10-CM | POA: Diagnosis present

## 2020-01-04 DIAGNOSIS — Z7901 Long term (current) use of anticoagulants: Secondary | ICD-10-CM | POA: Diagnosis not present

## 2020-01-04 DIAGNOSIS — Y92239 Unspecified place in hospital as the place of occurrence of the external cause: Secondary | ICD-10-CM | POA: Diagnosis not present

## 2020-01-04 DIAGNOSIS — Z6823 Body mass index (BMI) 23.0-23.9, adult: Secondary | ICD-10-CM

## 2020-01-04 DIAGNOSIS — Z7983 Long term (current) use of bisphosphonates: Secondary | ICD-10-CM | POA: Diagnosis not present

## 2020-01-04 DIAGNOSIS — Z7951 Long term (current) use of inhaled steroids: Secondary | ICD-10-CM

## 2020-01-04 DIAGNOSIS — E669 Obesity, unspecified: Secondary | ICD-10-CM | POA: Diagnosis present

## 2020-01-04 DIAGNOSIS — M546 Pain in thoracic spine: Secondary | ICD-10-CM | POA: Diagnosis not present

## 2020-01-04 DIAGNOSIS — E785 Hyperlipidemia, unspecified: Secondary | ICD-10-CM | POA: Diagnosis not present

## 2020-01-04 DIAGNOSIS — Z9981 Dependence on supplemental oxygen: Secondary | ICD-10-CM | POA: Diagnosis not present

## 2020-01-04 DIAGNOSIS — J9611 Chronic respiratory failure with hypoxia: Secondary | ICD-10-CM | POA: Diagnosis present

## 2020-01-04 DIAGNOSIS — Z7952 Long term (current) use of systemic steroids: Secondary | ICD-10-CM | POA: Diagnosis not present

## 2020-01-04 DIAGNOSIS — R05 Cough: Secondary | ICD-10-CM | POA: Diagnosis not present

## 2020-01-04 LAB — TROPONIN I (HIGH SENSITIVITY)
Troponin I (High Sensitivity): 5 ng/L (ref <18)
Troponin I (High Sensitivity): 5 ng/L (ref <18)

## 2020-01-04 LAB — BASIC METABOLIC PANEL
Anion gap: 14 (ref 5–15)
BUN: 15 mg/dL (ref 8–23)
CO2: 35 mmol/L — ABNORMAL HIGH (ref 22–32)
Calcium: 8.8 mg/dL — ABNORMAL LOW (ref 8.9–10.3)
Chloride: 92 mmol/L — ABNORMAL LOW (ref 98–111)
Creatinine, Ser: 0.76 mg/dL (ref 0.61–1.24)
GFR calc Af Amer: >60 mL/min (ref >60)
GFR calc non Af Amer: >60 mL/min (ref >60)
Glucose, Bld: 144 mg/dL — ABNORMAL HIGH (ref 70–99)
Potassium: 3.9 mmol/L (ref 3.5–5.1)
Sodium: 141 mmol/L (ref 135–145)

## 2020-01-04 LAB — CBC WITH DIFFERENTIAL/PLATELET
Abs Immature Granulocytes: 0.05 10*3/uL (ref 0.00–0.07)
Basophils Absolute: 0 10*3/uL (ref 0.0–0.1)
Basophils Relative: 0 %
Eosinophils Absolute: 0 10*3/uL (ref 0.0–0.5)
Eosinophils Relative: 0 %
HCT: 45.1 % (ref 39.0–52.0)
Hemoglobin: 13.9 g/dL (ref 13.0–17.0)
Immature Granulocytes: 0 %
Lymphocytes Relative: 6 %
Lymphs Abs: 1 10*3/uL (ref 0.7–4.0)
MCH: 25.5 pg — ABNORMAL LOW (ref 26.0–34.0)
MCHC: 30.8 g/dL (ref 30.0–36.0)
MCV: 82.6 fL (ref 80.0–100.0)
Monocytes Absolute: 0.2 10*3/uL (ref 0.1–1.0)
Monocytes Relative: 2 %
Neutro Abs: 13.6 10*3/uL — ABNORMAL HIGH (ref 1.7–7.7)
Neutrophils Relative %: 92 %
Platelets: 381 10*3/uL (ref 150–400)
RBC: 5.46 MIL/uL (ref 4.22–5.81)
RDW: 14.5 % (ref 11.5–15.5)
WBC: 14.9 10*3/uL — ABNORMAL HIGH (ref 4.0–10.5)
nRBC: 0 % (ref 0.0–0.2)

## 2020-01-04 LAB — SARS CORONAVIRUS 2 (TAT 6-24 HRS): SARS Coronavirus 2: NEGATIVE

## 2020-01-04 LAB — SARS CORONAVIRUS 2 AG (30 MIN TAT): SARS Coronavirus 2 Ag: NEGATIVE

## 2020-01-04 MED ORDER — ALBUTEROL (5 MG/ML) CONTINUOUS INHALATION SOLN
10.0000 mg/h | INHALATION_SOLUTION | Freq: Once | RESPIRATORY_TRACT | Status: AC
  Administered 2020-01-04: 10 mg/h via RESPIRATORY_TRACT

## 2020-01-04 MED ORDER — ALBUTEROL SULFATE (2.5 MG/3ML) 0.083% IN NEBU: 2.5000 mg | INHALATION_SOLUTION | RESPIRATORY_TRACT | Status: DC | PRN

## 2020-01-04 MED ORDER — SODIUM CHLORIDE 0.9 % IV SOLN: INTRAVENOUS | Status: DC | PRN

## 2020-01-04 MED ORDER — IPRATROPIUM BROMIDE 0.02 % IN SOLN
1.0000 mg | Freq: Once | RESPIRATORY_TRACT | Status: AC
  Administered 2020-01-04: 1 mg via RESPIRATORY_TRACT
  Filled 2020-01-04: qty 5

## 2020-01-04 MED ORDER — ALBUTEROL SULFATE HFA 108 (90 BASE) MCG/ACT IN AERS
2.0000 | INHALATION_SPRAY | RESPIRATORY_TRACT | Status: DC | PRN
  Filled 2020-01-04: qty 6.7

## 2020-01-04 MED ORDER — ALBUTEROL (5 MG/ML) CONTINUOUS INHALATION SOLN
10.0000 mg/h | INHALATION_SOLUTION | Freq: Once | RESPIRATORY_TRACT | Status: AC
  Administered 2020-01-04: 10 mg/h via RESPIRATORY_TRACT
  Filled 2020-01-04: qty 20

## 2020-01-04 MED ORDER — METHYLPREDNISOLONE SODIUM SUCC 125 MG IJ SOLR
125.0000 mg | Freq: Once | INTRAMUSCULAR | Status: AC
  Administered 2020-01-04: 125 mg via INTRAVENOUS
  Filled 2020-01-04: qty 2

## 2020-01-04 MED ORDER — MAGNESIUM SULFATE 2 GM/50ML IV SOLN
2.0000 g | Freq: Once | INTRAVENOUS | Status: AC
  Administered 2020-01-04: 2 g via INTRAVENOUS
  Filled 2020-01-04: qty 50

## 2020-01-04 NOTE — ED Notes (Signed)
Pt. Was moved to room 8 and his scooting from bed to bed caused him to get severely short of breath.  RT Richardson Landry at bedside of pt.

## 2020-01-04 NOTE — Progress Notes (Addendum)
Admitted from Med [facility]. Patient is alert and oriented, not in distress, vital sign taken and recorded, SOB noted with transfer, on O2 at 2LPM. Will continue to monitor and endorse appropriately to incoming RN . Oriented to unit and staff. Patient placement RN made aware of the admission and will page Md for admitting orders.

## 2020-01-04 NOTE — ED Notes (Signed)
Pt. Is staying in touch with his son via cell phone.  Also staying in touch with his other children.

## 2020-01-04 NOTE — ED Notes (Signed)
ED Provider at bedside. RT at bedside 

## 2020-01-04 NOTE — H&P (Signed)
History and Physical    Paul [name] [id number] DOB: 1946/11/29 DOA: 01/04/2020  PCP: [name], MD Patient coming from: [facility]  Chief Complaint: Shortness of breath  HPI: Paul [name] is a 74 y.o. male with medical history significant of A. fib, COPD, chronic respiratory failure on 2 L home oxygen, hyperlipidemia presented to the ED with complaints of shortness of breath.  Patient states he has had shortness of breath and a cough productive of green-colored sputum for the past 1 week.  He is on chronic steroids for his COPD and spoke to his pulmonologist who recommended increasing the dose of his home prednisone.  He continued to be symptomatic and yesterday could hardly breathe which made him go to the ED.  No fevers.  No chest pain.  No rhinorrhea or sore throat.  Reports compliance with his home COPD inhalers including Trelegy and albuterol.  He normally uses 2 L supplemental oxygen at night but for the past 1 week has been requiring in the daytime as well.  ED Course: Patient was in respiratory distress on arrival to the ED.  Had accessory muscle use and decreased air movement bilaterally.  Satting well on 2 L home oxygen.  He was treated with continuous nebulizer, Solu-Medrol 125 mg, and IV magnesium 2 g.  SARS-CoV-2 rapid antigen and PCR test negative.  Labs showing WBC count 14.9.  High-sensitivity troponin x2 negative.  Chest x-ray personally reviewed showing stable underlying emphysematous changes and pulmonary scarring; no acute cardiopulmonary findings.  Review of Systems:  All systems reviewed and apart from history of presenting illness, are negative.  Past Medical History:  Diagnosis Date  . A-fib ([place])   . COPD (chronic obstructive pulmonary disease) ([place])   . High cholesterol     Past Surgical History:  Procedure Laterality Date  . CATARACT EXTRACTION Right   . SKIN SURGERY     shoulders and chest     reports that he quit smoking about 10 years ago. His smoking use  included cigarettes. He has a 104.00 pack-year smoking history. He has never used smokeless tobacco. He reports current alcohol use. He reports that he does not use drugs.  No Known Allergies  Family History  Problem Relation Age of Onset  . Heart disease Brother   . Heart disease Mother   . Heart disease Father   . Cancer Paternal Uncle     Prior to Admission medications   Medication Sig Start Date End Date Taking? Authorizing Provider  albuterol (PROAIR HFA) 108 (90 Base) MCG/ACT inhaler Inhale 2 puffs into the lungs every 6 (six) hours as needed for wheezing or shortness of breath.   Yes [provider]  albuterol (PROVENTIL) (2.5 MG/3ML) 0.083% nebulizer solution Take 3 mLs (2.5 mg total) by nebulization every 6 (six) hours as needed for wheezing or shortness of breath. 02/28/19  Yes [name], PA-C  apixaban (ELIQUIS) 5 MG TABS tablet Take 1 tablet (5 mg total) by mouth 2 (two) times daily. 12/27/19  Yes Jerline Pain, MD  atorvastatin (LIPITOR) 40 MG tablet TAKE 1 TABLET(40 MG) BY MOUTH DAILY AFTER SUPPER Patient taking differently: Take 40 mg by mouth daily at 6 PM.  12/27/19  Yes Skains, Thana Farr, MD  Calcium Carbonate-Vit D-Min (CALCIUM 600+D PLUS MINERALS) 600-400 MG-UNIT TABS Take by mouth daily.   Yes [provider]  diltiazem (CARDIZEM CD) 360 MG 24 hr capsule Take 1 capsule (360 mg total) by mouth daily. 09/24/19  Yes Candee Furbish  C, MD  Fluticasone-Umeclidin-Vilant (TRELEGY ELLIPTA) 100-62.5-25 MCG/INH AEPB Inhale 1 puff into the lungs daily. 10/26/19  Yes Tanda Rockers, MD  furosemide (LASIX) 40 MG tablet Take 1 tablet (40 mg total) by mouth daily. 12/27/19 03/26/20 Yes Jerline Pain, MD  guaiFENesin (MUCINEX) 600 MG 12 hr tablet Take 600 mg by mouth daily.   Yes [provider]  loratadine (CLARITIN) 10 MG tablet Take 10 mg by mouth daily.   Yes [provider]  Melatonin 5 MG TABS Take 1 tablet by mouth at bedtime.   Yes [provider]  metoprolol tartrate (LOPRESSOR) 25 MG tablet Take 2 tablets (50 mg total) by mouth 2 (two) times daily. 12/28/19  Yes Jerline Pain, MD  omeprazole (PRILOSEC) 20 MG capsule Take 20 mg by mouth 2 (two) times daily before a meal.   Yes [provider]  OXYGEN Inhale 2 L into the lungs continuous. With sleep only  Adapt   Yes [provider]  predniSONE (DELTASONE) 10 MG tablet One daily until better then one half daily Patient taking differently: Take 20 mg by mouth daily. One daily until better then one half daily 12/21/19  Yes Tanda Rockers, MD  tiZANidine (ZANAFLEX) 2 MG tablet Take 2 mg by mouth every 6 (six) hours as needed for muscle spasms.   Yes [provider]    Physical Exam: Vitals:   01/04/20 1715 01/04/20 1833 01/04/20 2046 01/05/20 0127  BP: (!) 141/59 (!) 156/56 (!) 155/68   Pulse: 98 (!) 102 (!) 101   Resp: 11 18 20    Temp:  97.9 F (36.6 C) 98.7 F (37.1 C)   TempSrc:  Oral Oral   SpO2: 100% 100% 97% 97%  Weight:      Height:        Physical Exam  Constitutional: He is oriented to person, place, and time. He appears well-developed and well-nourished. No distress.  HENT:  Head: Normocephalic.  Eyes: Right eye exhibits no discharge. Left eye exhibits no discharge.  Cardiovascular: Normal rate, regular rhythm and intact distal pulses.  Pulmonary/Chest: Effort normal and breath sounds normal. No respiratory distress. He has no wheezes. He has no rales.  Abdominal: Soft. Bowel sounds are normal. He exhibits no distension. There is no abdominal tenderness. There is no guarding.  Musculoskeletal:        General: No edema.     Cervical back: Neck supple.  Neurological: He is alert and oriented to person, place, and time.  Skin: Skin is warm and dry. He is not diaphoretic.     Labs on Admission: I have personally reviewed following labs and imaging studies  CBC: Recent Labs  Lab 01/04/20 1404  WBC 14.9*  NEUTROABS  13.6*  HGB 13.9  HCT 45.1  MCV 82.6  PLT 123XX123   Basic Metabolic Panel: Recent Labs  Lab 01/04/20 1404  NA 141  K 3.9  CL 92*  CO2 35*  GLUCOSE 144*  BUN 15  CREATININE 0.76  CALCIUM 8.8*   GFR: Estimated Creatinine Clearance: 73.1 mL/min (by C-G formula based on SCr of 0.76 mg/dL). Liver Function Tests: No results for input(s): AST, ALT, ALKPHOS, BILITOT, PROT, ALBUMIN in the last 168 hours. No results for input(s): LIPASE, AMYLASE in the last 168 hours. No results for input(s): AMMONIA in the last 168 hours. Coagulation Profile: No results for input(s): INR, PROTIME in the last 168 hours. Cardiac Enzymes: No results for input(s): CKTOTAL, CKMB, CKMBINDEX, TROPONINI in  the last 168 hours. BNP (last 3 results) No results for input(s): PROBNP in the last 8760 hours. HbA1C: No results for input(s): HGBA1C in the last 72 hours. CBG: No results for input(s): GLUCAP in the last 168 hours. Lipid Profile: No results for input(s): CHOL, HDL, LDLCALC, TRIG, CHOLHDL, LDLDIRECT in the last 72 hours. Thyroid Function Tests: No results for input(s): TSH, T4TOTAL, FREET4, T3FREE, THYROIDAB in the last 72 hours. Anemia Panel: No results for input(s): VITAMINB12, FOLATE, FERRITIN, TIBC, IRON, RETICCTPCT in the last 72 hours. Urine analysis:    Component Value Date/Time   COLORURINE YELLOW 03/03/2019 1325   APPEARANCEUR CLEAR 03/03/2019 1325   LABSPEC <1.005 (L) 03/03/2019 1325   PHURINE 6.5 03/03/2019 1325   GLUCOSEU NEGATIVE 03/03/2019 1325   HGBUR NEGATIVE 03/03/2019 1325   BILIRUBINUR NEGATIVE 03/03/2019 1325   KETONESUR NEGATIVE 03/03/2019 1325   PROTEINUR NEGATIVE 03/03/2019 1325   NITRITE NEGATIVE 03/03/2019 1325   LEUKOCYTESUR NEGATIVE 03/03/2019 1325    Radiological Exams on Admission: DG Chest Port 1 View  Result Date: 01/04/2020 CLINICAL DATA:  One week history of shortness of breath. EXAM: PORTABLE CHEST 1 VIEW COMPARISON:  08/16/2019 FINDINGS: The cardiac  silhouette, mediastinal and hilar contours are within normal limits. Mild tortuosity and calcification of the thoracic aorta. Chronic underlying emphysematous changes and pulmonary scarring but no definite acute overlying pulmonary process. No worrisome pulmonary lesions. The bony thorax is intact. IMPRESSION: No acute cardiopulmonary findings. Stable underlying emphysematous changes and pulmonary scarring. Electronically Signed   By: Marijo Sanes M.D.   On: 01/04/2020 14:24    EKG: Independently reviewed.  Normal sinus rhythm, heart rate 83.  Assessment/Plan Principal Problem:   COPD with acute exacerbation (HCC) Active Problems:   Chronic respiratory failure with hypoxia (HCC)   Paroxysmal atrial fibrillation (HCC)   HLD (hyperlipidemia)   Acute COPD exacerbation Patient presented with 1 week history of dyspnea and productive cough.  He normally uses 2 L supplemental oxygen at night but now is requiring it even in the daytime.  He has significant respiratory distress on arrival to the ED.  Respiratory status improved drastically after he was treated with continuous nebulizer, Solu-Medrol 125 mg, and IV magnesium 2 g. Chest x-ray personally reviewed showing stable underlying emphysematous changes and pulmonary scarring; no acute cardiopulmonary findings.  SARS-CoV-2 rapid antigen and PCR test negative.  Labs showing mild leukocytosis, likely reactive.  Patient is afebrile. -Solu-Medrol 60 mg every 12 hours -DuoNebs every 6 hours -Albuterol nebulizer twice daily -Pulmicort nebulizer twice a day -Azithromycin given severity of illness -Mucinex -Continue to monitor WBC count -Continuous pulse ox -Supplemental oxygen as needed  Chronic respiratory failure with hypoxia Currently satting well on 2 L supplemental oxygen. -Continuous pulse ox, continue supplemental oxygen  Paroxysmal atrial fibrillation Currently in sinus rhythm. -Continue Cardizem for rate control -Continue Eliquis for  anticoagulation  Hyperlipidemia -Continue Lipitor  DVT prophylaxis: Eliquis Code Status: Full code.  Discussed with the patient. Family Communication: No family bedside. Disposition Plan: Anticipate discharge after clinical improvement. Consults called: None Admission status: It is my clinical opinion that admission to INPATIENT is reasonable and necessary in this 74 y.o. male . Presented with significant respiratory distress secondary to acute COPD exacerbation.  Given the aforementioned, the predictability of an adverse outcome is felt to be significant. I expect that the patient will require at least 2 midnights in the hospital to treat this condition.   The medical decision making on this patient was of high complexity and  the patient is at high risk for clinical deterioration, therefore this is a level 3 visit.  Shela Leff MD [facility] Pager [phone]  If 7PM-7AM, please contact night-coverage [email or web address] Password [facility]  01/05/2020, 4:31 AM

## 2020-01-04 NOTE — ED Provider Notes (Signed)
[place] [facility] EMERGENCY DEPARTMENT Provider Note   CSN: [id number] Arrival date & time: 01/04/20  1342     History Chief Complaint  Patient presents with  . Respiratory Distress    Paul [name] is a 74 y.o. male.  The history is provided by the patient and medical records. No language interpreter was used.    Paul [name] is a 74 y.o. male who presents to the Emergency Department complaining of shortness of breath. He complains of shortness of breath for the last week, similar to prior COPD exacerbations. He has a cough productive of green sputum. He has been using his albuterol at home with no significant improvement in symptoms. He is on 2 L nasal cannula at baseline. No known COVID 19 exposures. He lives at home with his wife. He denies any fever, chest pain, abdominal pain, nausea, vomiting, leg swelling or pain. Symptoms are severe, constant, worsening.    Past Medical History:  Diagnosis Date  . A-fib ([place])   . COPD (chronic obstructive pulmonary disease) ([place])   . High cholesterol     Patient Active Problem List   Diagnosis Date Noted  . Vertebral compression fracture ([place]) 05/29/2019  . Chronic anemia 05/29/2019  . Pulmonary nodules 05/29/2019  . Viral pneumonitis 05/28/2019  . COPD with acute exacerbation ([place]) 05/28/2019  . Chronic anticoagulation 04/06/2019  . Coronary artery calcification 04/06/2019  . Atherosclerosis of aorta ([place]) 04/06/2019  . Paroxysmal atrial fibrillation (HCC)   . Acute on chronic respiratory failure with hypoxia ([place])   . COPD exacerbation ([place]) 03/19/2019  . Lung nodule < 6cm on CT 05/29/2016  . Former cigarette smoker 12/05/2015  . Chronic respiratory failure with hypoxia ([place]) 01/27/2013  . COPD GOLD  III    . High cholesterol     Past Surgical History:  Procedure Laterality Date  . CATARACT EXTRACTION Right   . SKIN SURGERY     shoulders and chest       Family History  Problem Relation Age of Onset  . Heart  disease Brother   . Heart disease Mother   . Heart disease Father   . Cancer Paternal Uncle     Social History   Tobacco Use  . Smoking status: Former Smoker    Packs/day: 2.00    Years: 52.00    Pack years: 104.00    Types: Cigarettes    Quit date: 02/03/2009    Years since quitting: 10.9  . Smokeless tobacco: Never Used  . Tobacco comment: Counseled to remain smoke free  Substance Use Topics  . Alcohol use: Yes    Alcohol/week: 0.0 standard drinks    Comment: occ  . Drug use: No    Home Medications Prior to Admission medications   Medication Sig Start Date End Date Taking? Authorizing Provider  alendronate (FOSAMAX) 70 MG tablet Take 70 mg by mouth once a week. Take with a full glass of water on an empty stomach.   Yes [provider]  budesonide-formoterol (SYMBICORT) 160-4.5 MCG/ACT inhaler Inhale 2 puffs into the lungs 2 (two) times daily.   Yes [provider]  guaiFENesin (MUCINEX) 600 MG 12 hr tablet Take 600 mg by mouth daily.   Yes [provider]  tiotropium (SPIRIVA) 18 MCG inhalation capsule Place 18 mcg into inhaler and inhale daily.   Yes [provider]  albuterol (PROAIR HFA) 108 (90 Base) MCG/ACT inhaler Inhale 2 puffs into the lungs every 6 (six) hours as needed for wheezing or  shortness of breath.    [provider]  albuterol (PROVENTIL) (2.5 MG/3ML) 0.083% nebulizer solution Take 3 mLs (2.5 mg total) by nebulization every 6 (six) hours as needed for wheezing or shortness of breath. 02/28/19   [name], PA-C  apixaban (ELIQUIS) 5 MG TABS tablet Take 1 tablet (5 mg total) by mouth 2 (two) times daily. 12/27/19   Jerline Pain, MD  atorvastatin (LIPITOR) 40 MG tablet TAKE 1 TABLET(40 MG) BY MOUTH DAILY AFTER SUPPER 12/27/19   Jerline Pain, MD  Calcium Carbonate-Vit D-Min (CALCIUM 600+D PLUS MINERALS) 600-400 MG-UNIT TABS Take by mouth daily.    [provider]  diltiazem (CARDIZEM CD) 360 MG 24 hr  capsule Take 1 capsule (360 mg total) by mouth daily. 09/24/19   Jerline Pain, MD  Fluticasone-Umeclidin-Vilant (TRELEGY ELLIPTA) 100-62.5-25 MCG/INH AEPB Inhale 1 puff into the lungs daily. 10/26/19   Tanda Rockers, MD  furosemide (LASIX) 40 MG tablet Take 1 tablet (40 mg total) by mouth daily. 12/27/19 03/26/20  Jerline Pain, MD  loratadine (CLARITIN) 10 MG tablet Take 10 mg by mouth daily.    [provider]  metoprolol tartrate (LOPRESSOR) 25 MG tablet Take 2 tablets (50 mg total) by mouth 2 (two) times daily. 12/28/19   Jerline Pain, MD  OXYGEN Inhale 2 L into the lungs continuous. With sleep only  Adapt    [provider]  predniSONE (DELTASONE) 10 MG tablet One daily until better then one half daily 12/21/19   Tanda Rockers, MD  Respiratory Therapy Supplies (FLUTTER) DEVI Use as directed Patient taking differently: as directed.  04/15/19   Tanda Rockers, MD    Allergies    Patient has no known allergies.  Review of Systems   Review of Systems  All other systems reviewed and are negative.   Physical Exam Updated Vital Signs BP (!) 149/91   Pulse 90   Temp 97.7 F (36.5 C) (Oral)   Resp (!) 32   Ht 5\' 6"  (1.676 m)   Wt 65.3 kg   SpO2 96%   BMI 23.24 kg/m   Physical Exam Vitals and nursing note reviewed.  Constitutional:      General: He is in acute distress.     Appearance: He is well-developed. He is ill-appearing.  HENT:     Head: Normocephalic and atraumatic.  Cardiovascular:     Rate and Rhythm: Normal rate and regular rhythm.     Heart sounds: No murmur.  Pulmonary:     Effort: Respiratory distress present.     Comments: Decreased air movement bilaterally. There is excess three muscle use. Speaks in short phrases. Abdominal:     Palpations: Abdomen is soft.     Tenderness: There is no abdominal tenderness. There is no guarding or rebound.  Musculoskeletal:        General: No swelling or tenderness.  Skin:    General: Skin is warm and  dry.  Neurological:     Mental Status: He is alert and oriented to person, place, and time.  Psychiatric:        Behavior: Behavior normal.     ED Results / Procedures / Treatments   Labs (all labs ordered are listed, but only abnormal results are displayed) Labs Reviewed  BASIC METABOLIC PANEL - Abnormal; Notable for the following components:      Result Value   Chloride 92 (*)    CO2 35 (*)    Glucose,  Bld 144 (*)    Calcium 8.8 (*)    All other components within normal limits  CBC WITH DIFFERENTIAL/PLATELET - Abnormal; Notable for the following components:   WBC 14.9 (*)    MCH 25.5 (*)    Neutro Abs 13.6 (*)    All other components within normal limits  SARS CORONAVIRUS 2 AG (30 MIN TAT)  SARS CORONAVIRUS 2 (TAT 6-24 HRS)  TROPONIN I (HIGH SENSITIVITY)  TROPONIN I (HIGH SENSITIVITY)    EKG EKG Interpretation  Date/Time:  Tuesday January 04 2020 13:48:55 EST Ventricular Rate:  83 PR Interval:  136 QRS Duration: 78 QT Interval:  354 QTC Calculation: 415 R Axis:   70 Text Interpretation: Normal sinus rhythm Normal ECG Confirmed by Quintella Reichert [phone]) on 01/04/2020 2:23:17 PM   Radiology DG Chest Port 1 View  Result Date: 01/04/2020 CLINICAL DATA:  One week history of shortness of breath. EXAM: PORTABLE CHEST 1 VIEW COMPARISON:  08/16/2019 FINDINGS: The cardiac silhouette, mediastinal and hilar contours are within normal limits. Mild tortuosity and calcification of the thoracic aorta. Chronic underlying emphysematous changes and pulmonary scarring but no definite acute overlying pulmonary process. No worrisome pulmonary lesions. The bony thorax is intact. IMPRESSION: No acute cardiopulmonary findings. Stable underlying emphysematous changes and pulmonary scarring. Electronically Signed   By: Marijo Sanes M.D.   On: 01/04/2020 14:24    Procedures Procedures (including critical care time) CRITICAL CARE Performed by: Quintella Reichert   Total critical care time:  35 minutes  Critical care time was exclusive of separately billable procedures and treating other patients.  Critical care was necessary to treat or prevent imminent or life-threatening deterioration.  Critical care was time spent personally by me on the following activities: development of treatment plan with patient and/or surrogate as well as nursing, discussions with consultants, evaluation of patient's response to treatment, examination of patient, obtaining history from patient or surrogate, ordering and performing treatments and interventions, ordering and review of laboratory studies, ordering and review of radiographic studies, pulse oximetry and re-evaluation of patient's condition.  Medications Ordered in ED Medications  0.9 %  sodium chloride infusion ( Intravenous Stopped 01/04/20 1454)  albuterol (VENTOLIN HFA) 108 (90 Base) MCG/ACT inhaler 2 puff (has no administration in time range)  albuterol (PROVENTIL,VENTOLIN) solution continuous neb (has no administration in time range)  methylPREDNISolone sodium succinate (SOLU-MEDROL) 125 mg/2 mL injection 125 mg (125 mg Intravenous Given 01/04/20 1411)  magnesium sulfate IVPB 2 g 50 mL ( Intravenous Stopped 01/04/20 1439)  albuterol (PROVENTIL,VENTOLIN) solution continuous neb (10 mg/hr Nebulization Given 01/04/20 1421)  ipratropium (ATROVENT) nebulizer solution 1 mg (1 mg Nebulization Given 01/04/20 1421)    ED Course  I have reviewed the triage vital signs and the nursing notes.  Pertinent labs & imaging results that were available during my care of the patient were reviewed by me and considered in my medical decision making (see chart for details).    MDM Rules/Calculators/A&P                     Patient with history of COPD in the emergency department with increased work of breathing following recent treatment for COPD exacerbation with prednisone and azithromycin. Patient in respiratory distress on initial ED presentation with  accessory muscle use and decreased air movement bilaterally. He was treated with continuous nebulizer, Solu-Medrol and magnesium. Patient is improved on repeat assessment following our long treatment with improved air movement bilaterally and improved  work of breathing. He  does have persistent tachypnea and mild increased work of breathing. Plan to provide additional treatments and admit for further management of COPD exacerbation. Hospitalist consulted for admission.  Paul [name] was evaluated in Emergency Department on 01/04/2020 for the symptoms described in the history of present illness. He was evaluated in the context of the global COVID-19 pandemic, which necessitated consideration that the patient might be at risk for infection with the SARS-CoV-2 virus that causes COVID-19. Institutional protocols and algorithms that pertain to the evaluation of patients at risk for COVID-19 are in a state of rapid change based on information released by regulatory bodies including the CDC and federal and state organizations. These policies and algorithms were followed during the patient's care in the ED.  Final Clinical Impression(s) / ED Diagnoses Final diagnoses:  COPD exacerbation [facility])    Rx / DC Orders ED Discharge Orders    None       Quintella Reichert, MD 01/05/20 [phone]

## 2020-01-04 NOTE — ED Notes (Signed)
Spoke with Pt. Son Shanon Brow about the Pt. And told him he can call any time to ask questions.

## 2020-01-04 NOTE — ED Triage Notes (Signed)
Pt has hx of COPD. Pt has had increasing trouble breathing x 1 week. Pt denies any associated infectious symptoms. Pt in obvious resp distress speaking 1-2 words between breaths. Pt taken directly to ED room 14.

## 2020-01-05 ENCOUNTER — Encounter (HOSPITAL_COMMUNITY): Payer: Self-pay | Admitting: Internal Medicine

## 2020-01-05 DIAGNOSIS — E782 Mixed hyperlipidemia: Secondary | ICD-10-CM

## 2020-01-05 DIAGNOSIS — E785 Hyperlipidemia, unspecified: Secondary | ICD-10-CM

## 2020-01-05 LAB — CBC
HCT: 37.2 % — ABNORMAL LOW (ref 39.0–52.0)
Hemoglobin: 11.7 g/dL — ABNORMAL LOW (ref 13.0–17.0)
MCH: 25.3 pg — ABNORMAL LOW (ref 26.0–34.0)
MCHC: 31.5 g/dL (ref 30.0–36.0)
MCV: 80.5 fL (ref 80.0–100.0)
Platelets: 305 10*3/uL (ref 150–400)
RBC: 4.62 MIL/uL (ref 4.22–5.81)
RDW: 14.6 % (ref 11.5–15.5)
WBC: 10.5 10*3/uL (ref 4.0–10.5)
nRBC: 0 % (ref 0.0–0.2)

## 2020-01-05 LAB — HIV ANTIBODY (ROUTINE TESTING W REFLEX): HIV Screen 4th Generation wRfx: NONREACTIVE

## 2020-01-05 MED ORDER — ATORVASTATIN CALCIUM 40 MG PO TABS
40.0000 mg | ORAL_TABLET | Freq: Every day | ORAL | Status: DC
  Administered 2020-01-05 – 2020-01-10 (×6): 40 mg via ORAL
  Filled 2020-01-05 (×6): qty 1

## 2020-01-05 MED ORDER — LORATADINE 10 MG PO TABS
10.0000 mg | ORAL_TABLET | Freq: Every day | ORAL | Status: DC
  Administered 2020-01-05 – 2020-01-11 (×7): 10 mg via ORAL
  Filled 2020-01-05 (×7): qty 1

## 2020-01-05 MED ORDER — METOPROLOL TARTRATE 50 MG PO TABS
50.0000 mg | ORAL_TABLET | Freq: Two times a day (BID) | ORAL | Status: DC
  Administered 2020-01-05 – 2020-01-11 (×14): 50 mg via ORAL
  Filled 2020-01-05 (×15): qty 1

## 2020-01-05 MED ORDER — ADULT MULTIVITAMIN W/MINERALS CH
1.0000 | ORAL_TABLET | Freq: Every day | ORAL | Status: DC
  Administered 2020-01-05 – 2020-01-11 (×7): 1 via ORAL
  Filled 2020-01-05 (×7): qty 1

## 2020-01-05 MED ORDER — PANTOPRAZOLE SODIUM 20 MG PO TBEC
20.0000 mg | DELAYED_RELEASE_TABLET | Freq: Two times a day (BID) | ORAL | Status: DC
  Administered 2020-01-05 – 2020-01-11 (×13): 20 mg via ORAL
  Filled 2020-01-05 (×14): qty 1

## 2020-01-05 MED ORDER — BUDESONIDE 0.25 MG/2ML IN SUSP
0.2500 mg | Freq: Two times a day (BID) | RESPIRATORY_TRACT | Status: DC
  Administered 2020-01-05 – 2020-01-11 (×13): 0.25 mg via RESPIRATORY_TRACT
  Filled 2020-01-05 (×13): qty 2

## 2020-01-05 MED ORDER — TIZANIDINE HCL 2 MG PO TABS
2.0000 mg | ORAL_TABLET | Freq: Four times a day (QID) | ORAL | Status: DC | PRN
  Administered 2020-01-10: 2 mg via ORAL
  Filled 2020-01-05: qty 1

## 2020-01-05 MED ORDER — GUAIFENESIN ER 600 MG PO TB12
600.0000 mg | ORAL_TABLET | Freq: Two times a day (BID) | ORAL | Status: DC
  Administered 2020-01-05 – 2020-01-11 (×13): 600 mg via ORAL
  Filled 2020-01-05 (×13): qty 1

## 2020-01-05 MED ORDER — DEXTROSE 5 % IV SOLN
250.0000 mg | INTRAVENOUS | Status: AC
  Administered 2020-01-05 – 2020-01-09 (×4): 250 mg via INTRAVENOUS
  Filled 2020-01-05 (×8): qty 250

## 2020-01-05 MED ORDER — SODIUM CHLORIDE 0.9 % IV SOLN
500.0000 mg | Freq: Once | INTRAVENOUS | Status: AC
  Administered 2020-01-05: 500 mg via INTRAVENOUS
  Filled 2020-01-05: qty 500

## 2020-01-05 MED ORDER — ENOXAPARIN SODIUM 40 MG/0.4ML ~~LOC~~ SOLN: 40.0000 mg | SUBCUTANEOUS | Status: DC

## 2020-01-05 MED ORDER — DILTIAZEM HCL ER COATED BEADS 180 MG PO CP24
360.0000 mg | ORAL_CAPSULE | Freq: Every day | ORAL | Status: DC
  Administered 2020-01-05 – 2020-01-11 (×7): 360 mg via ORAL
  Filled 2020-01-05 (×7): qty 2

## 2020-01-05 MED ORDER — METHYLPREDNISOLONE SODIUM SUCC 125 MG IJ SOLR
60.0000 mg | Freq: Two times a day (BID) | INTRAMUSCULAR | Status: DC
  Administered 2020-01-05 – 2020-01-07 (×5): 60 mg via INTRAVENOUS
  Filled 2020-01-05 (×5): qty 2

## 2020-01-05 MED ORDER — METHYLPREDNISOLONE SODIUM SUCC 125 MG IJ SOLR: 60.0000 mg | Freq: Four times a day (QID) | INTRAMUSCULAR | Status: DC

## 2020-01-05 MED ORDER — IPRATROPIUM-ALBUTEROL 0.5-2.5 (3) MG/3ML IN SOLN
3.0000 mL | Freq: Four times a day (QID) | RESPIRATORY_TRACT | Status: DC
  Administered 2020-01-05 (×2): 3 mL via RESPIRATORY_TRACT
  Filled 2020-01-05 (×2): qty 3

## 2020-01-05 MED ORDER — APIXABAN 5 MG PO TABS
5.0000 mg | ORAL_TABLET | Freq: Two times a day (BID) | ORAL | Status: DC
  Administered 2020-01-05 – 2020-01-11 (×14): 5 mg via ORAL
  Filled 2020-01-05 (×14): qty 1

## 2020-01-05 MED ORDER — GUAIFENESIN ER 600 MG PO TB12: 600.0000 mg | ORAL_TABLET | Freq: Every day | ORAL | Status: DC

## 2020-01-05 MED ORDER — FUROSEMIDE 40 MG PO TABS
40.0000 mg | ORAL_TABLET | Freq: Every day | ORAL | Status: DC
  Administered 2020-01-05 – 2020-01-11 (×7): 40 mg via ORAL
  Filled 2020-01-05 (×7): qty 1

## 2020-01-05 MED ORDER — IPRATROPIUM-ALBUTEROL 0.5-2.5 (3) MG/3ML IN SOLN
3.0000 mL | Freq: Two times a day (BID) | RESPIRATORY_TRACT | Status: DC
  Administered 2020-01-05: 3 mL via RESPIRATORY_TRACT
  Filled 2020-01-05: qty 3

## 2020-01-05 MED ORDER — MELATONIN 3 MG PO TABS
6.0000 mg | ORAL_TABLET | Freq: Every day | ORAL | Status: DC
  Administered 2020-01-05 – 2020-01-10 (×7): 6 mg via ORAL
  Filled 2020-01-05 (×8): qty 2

## 2020-01-05 MED ORDER — ENSURE ENLIVE PO LIQD
237.0000 mL | Freq: Two times a day (BID) | ORAL | Status: DC
  Administered 2020-01-05 – 2020-01-11 (×10): 237 mL via ORAL

## 2020-01-05 NOTE — Progress Notes (Addendum)
PROGRESS NOTE    Paul [name]  ITG:[id number] DOB: 1946-08-28 DOA: 01/04/2020 PCP: [name], MD   Brief Narrative:  Per H &P: Paul [name] is a 74 y.o. male with medical history significant of A. fib, COPD, chronic respiratory failure on 2 L home oxygen, hyperlipidemia presented to the ED with complaints of shortness of breath.  Patient states he has had shortness of breath and a cough productive of green-colored sputum for the past 1 week.  He is on chronic steroids for his COPD and spoke to his pulmonologist who recommended increasing the dose of his home prednisone.  He continued to be symptomatic and yesterday could hardly breathe which made him go to the ED.  No fevers.  No chest pain.  No rhinorrhea or sore throat.  Reports compliance with his home COPD inhalers including Trelegy and albuterol.  He normally uses 2 L supplemental oxygen at night but for the past 1 week has been requiring in the daytime as well.  ED Course: Patient was in respiratory distress on arrival to the ED.  Had accessory muscle use and decreased air movement bilaterally.  Satting well on 2 L home oxygen.  He was treated with continuous nebulizer, Solu-Medrol 125 mg, and IV magnesium 2 g.  SARS-CoV-2 rapid antigen and PCR test negative.  Labs showing WBC count 14.9.  High-sensitivity troponin x2 negative.  Chest x-ray personally reviewed showing stable underlying emphysematous changes and pulmonary scarring; no acute cardiopulmonary findings.  Assessment & Plan:   Principal Problem:   COPD with acute exacerbation ([place]) Active Problems:   Chronic respiratory failure with hypoxia (HCC)   Paroxysmal atrial fibrillation (HCC)   HLD (hyperlipidemia)  Acute COPD exacerbation: -continuous nebulizers in ER with dramatic improvement -Scheduled nebs BID duoneb and pulmicort -Solu-medrol 60 mg IV BID, if doing well will switch to oral tomorrow -CXR without acute infection but given severity will do 5 day azithromycin  stop date 01/09/20 -Patient is afebrile. -Mucinex BID -Continue to monitor WBC count -Continuous pulse ox -Supplemental oxygen as needed -PT/OT for mobilization  Chronic respiratory failure with hypoxia Currently satting well on 2 L supplemental oxygen. -Continuous pulse ox, continue supplemental oxygen  Paroxysmal atrial fibrillation: -Currently in sinus rhythm. -Continue Cardizem for rate control -Continue Eliquis for anticoagulation  Hyperlipidemia -Continue Lipitor  DVT prophylaxis: Eliquis Code Status: Full Family Communication: Wife updated via telephone Disposition Plan: pending clinical improvement likely 01/07/20  Consultants:   None  Procedures:   none  Antimicrobials:   Azithromycin start date 01/04/20 stop date 01/09/20   Subjective: Breathing better today than yesterday. Still not back to normal. Concerned as he does not have lasix ordered and typically takes that daily as prednisone makes him swell. Denies chest pains. Denies cough. Denies abdominal pain, nausea, vomiting, diarrhea, constipation.   Objective: Vitals:   01/04/20 2046 01/05/20 0127 01/05/20 0633 01/05/20 0852  BP: (!) 155/68  130/60   Pulse: (!) 101  78   Resp: 20  17   Temp: 98.7 F (37.1 C)  98.2 F (36.8 C)   TempSrc: Oral  Oral   SpO2: 97% 97% 98% 97%  Weight:      Height:        Intake/Output Summary (Last 24 hours) at 01/05/2020 1024 Last data filed at 01/05/2020 0855 Gross per 24 hour  Intake 304.74 ml  Output 720 ml  Net -415.26 ml   Filed Weights   01/04/20 1441  Weight: 65.3 kg    Examination:  General exam:  Appears calm and comfortable  Respiratory system: Clear to auscultation. Respiratory effort normal. No accessory muscle usage, oxygen cannula on Cardiovascular system: S1 & S2 heard, RRR. No JVD, murmurs, rubs, gallops or clicks. No pedal edema. Gastrointestinal system: Abdomen is nondistended, soft and nontender. No organomegaly or masses felt. Normal  bowel sounds heard. Central nervous system: Alert and oriented. No focal neurological deficits. Extremities: Symmetric 5 x 5 power. Skin: No rashes, lesions or ulcers Psychiatry: Judgement and insight appear normal. Mood & affect appropriate.   Data Reviewed: I have personally reviewed following labs and imaging studies  CBC: Recent Labs  Lab 01/04/20 1404 01/05/20 0250  WBC 14.9* 10.5  NEUTROABS 13.6*  --   HGB 13.9 11.7*  HCT 45.1 37.2*  MCV 82.6 80.5  PLT 381 706   Basic Metabolic Panel: Recent Labs  Lab 01/04/20 1404  NA 141  K 3.9  CL 92*  CO2 35*  GLUCOSE 144*  BUN 15  CREATININE 0.76  CALCIUM 8.8*   GFR: Estimated Creatinine Clearance: 73.1 mL/min (by C-G formula based on SCr of 0.76 mg/dL). Liver Function Tests: No results for input(s): AST, ALT, ALKPHOS, BILITOT, PROT, ALBUMIN in the last 168 hours. No results for input(s): LIPASE, AMYLASE in the last 168 hours. No results for input(s): AMMONIA in the last 168 hours. Coagulation Profile: No results for input(s): INR, PROTIME in the last 168 hours. Cardiac Enzymes: No results for input(s): CKTOTAL, CKMB, CKMBINDEX, TROPONINI in the last 168 hours. BNP (last 3 results) No results for input(s): PROBNP in the last 8760 hours. HbA1C: No results for input(s): HGBA1C in the last 72 hours. CBG: No results for input(s): GLUCAP in the last 168 hours. Lipid Profile: No results for input(s): CHOL, HDL, LDLCALC, TRIG, CHOLHDL, LDLDIRECT in the last 72 hours. Thyroid Function Tests: No results for input(s): TSH, T4TOTAL, FREET4, T3FREE, THYROIDAB in the last 72 hours. Anemia Panel: No results for input(s): VITAMINB12, FOLATE, FERRITIN, TIBC, IRON, RETICCTPCT in the last 72 hours. Sepsis Labs: No results for input(s): PROCALCITON, LATICACIDVEN in the last 168 hours.  Recent Results (from the past 240 hour(s))  SARS Coronavirus 2 Ag (30 min TAT) - Nasal Swab (BD Veritor Kit)     Status: None   Collection Time:  01/04/20  2:03 PM   Specimen: Nasal Swab (BD Veritor Kit)  Result Value Ref Range Status   SARS Coronavirus 2 Ag NEGATIVE NEGATIVE Final    Comment: (NOTE) SARS-CoV-2 antigen NOT DETECTED.  Negative results are presumptive.  Negative results do not preclude SARS-CoV-2 infection and should not be used as the sole basis for treatment or other patient management decisions, including infection  control decisions, particularly in the presence of clinical signs and  symptoms consistent with COVID-19, or in those who have been in contact with the virus.  Negative results must be combined with clinical observations, patient history, and epidemiological information. The expected result is Negative. Fact Sheet for Patients: PodPark.tn Fact Sheet for Healthcare Providers: GiftContent.is This test is not yet approved or cleared by the [place] FDA and  has been authorized for detection and/or diagnosis of SARS-CoV-2 by FDA under an Emergency Use Authorization (EUA).  This EUA will remain in effect (meaning this test can be used) for the duration of  the COVID-19 de claration under Section 564(b)(1) of the Act, 21 U.S.C. section 360bbb-3(b)(1), unless the authorization is terminated or revoked sooner. Performed at [facility], [address], [place], [place] [address]  SARS CORONAVIRUS 2 (TAT 6-24 HRS) Nasopharyngeal Nasopharyngeal Swab     Status: None   Collection Time: 01/04/20  3:41 PM   Specimen: Nasopharyngeal Swab  Result Value Ref Range Status   SARS Coronavirus 2 NEGATIVE NEGATIVE Final    Comment: (NOTE) SARS-CoV-2 target nucleic acids are NOT DETECTED. The SARS-CoV-2 RNA is generally detectable in upper and lower respiratory specimens during the acute phase of infection. Negative results do not preclude SARS-CoV-2 infection, do not rule out co-infections with other pathogens, and should not be used as  the sole basis for treatment or other patient management decisions. Negative results must be combined with clinical observations, patient history, and epidemiological information. The expected result is Negative. Fact Sheet for Patients: SugarRoll.be Fact Sheet for Healthcare Providers: https://www.woods-mathews.com/ This test is not yet approved or cleared by the [place] FDA and  has been authorized for detection and/or diagnosis of SARS-CoV-2 by FDA under an Emergency Use Authorization (EUA). This EUA will remain  in effect (meaning this test can be used) for the duration of the COVID-19 declaration under Section 56 4(b)(1) of the Act, 21 U.S.C. section 360bbb-3(b)(1), unless the authorization is terminated or revoked sooner. Performed at [facility], [place] [address], [place], [place] [address]       Radiology Studies: DG Chest Port 1 View  Result Date: 01/04/2020 CLINICAL DATA:  One week history of shortness of breath. EXAM: PORTABLE CHEST 1 VIEW COMPARISON:  08/16/2019 FINDINGS: The cardiac silhouette, mediastinal and hilar contours are within normal limits. Mild tortuosity and calcification of the thoracic aorta. Chronic underlying emphysematous changes and pulmonary scarring but no definite acute overlying pulmonary process. No worrisome pulmonary lesions. The bony thorax is intact. IMPRESSION: No acute cardiopulmonary findings. Stable underlying emphysematous changes and pulmonary scarring. Electronically Signed   By: Marijo Sanes M.D.   On: 01/04/2020 14:24   Scheduled Meds: . apixaban  5 mg Oral BID  . atorvastatin  40 mg Oral q1800  . budesonide (PULMICORT) nebulizer solution  0.25 mg Nebulization BID  . diltiazem  360 mg Oral Daily  . furosemide  40 mg Oral Daily  . guaiFENesin  600 mg Oral BID  . ipratropium-albuterol  3 mL Nebulization BID  . loratadine  10 mg Oral Daily  . Melatonin  6 mg Oral QHS  .  methylPREDNISolone (SOLU-MEDROL) injection  60 mg Intravenous Q12H  . metoprolol tartrate  50 mg Oral BID  . pantoprazole  20 mg Oral BID AC   Continuous Infusions: . sodium chloride Stopped (01/04/20 1454)  . [START ON 01/06/2020] azithromycin       LOS: 1 day   Time spent: [place], MD [facility] Pager [phone]  If 7PM-7AM, please contact night-coverage [email or web address] Password [facility] 01/05/2020, 10:24 AM

## 2020-01-05 NOTE — TOC Initial Note (Signed)
Transition of Care [facility]) - Initial/Assessment Note    Patient Details  Name: Paul [name] MRN: [id number] Date of Birth: 1946/08/10  Transition of [facility]) CM/SW Contact:    Marilu Favre, RN Phone Number: 01/05/2020, 10:34 AM  Clinical Narrative:                 Spoke to patient at bedside. Confirmed face sheet information. From home with wife. Has home oxygen through [place] that he normally wears at night. He does have a portable tank family can bring at discharge if needed. Has PCP and transportation to appointments.  Expected Discharge Plan: Home/Self Care Barriers to Discharge: Continued Medical Work up   Patient Goals and CMS Choice Patient states their goals for this hospitalization and ongoing recovery are:: to return to home CMS Medicare.gov Compare Post Acute Care list provided to:: Patient Choice offered to / list presented to : NA  Expected Discharge Plan and Services Expected Discharge Plan: Home/Self Care     Post Acute Care Choice: Durable Medical Equipment Living arrangements for the past 2 months: Single Family Home                 DME Arranged: N/A         HH Arranged: NA          Prior Living Arrangements/Services Living arrangements for the past 2 months: Single Family Home Lives with:: Spouse Patient language and need for interpreter reviewed:: Yes Do you feel safe going back to the place where you live?: Yes      Need for Family Participation in Patient Care: Yes (Comment) Care giver support system in place?: Yes (comment) Current home services: DME Criminal Activity/Legal Involvement Pertinent to Current Situation/Hospitalization: No - Comment as needed  Activities of Daily Living      Permission Sought/Granted   Permission granted to share information with : No              Emotional Assessment Appearance:: Appears stated age Attitude/Demeanor/Rapport: Engaged Affect (typically observed): Accepting Orientation: :  Oriented to  Time, Oriented to Situation, Oriented to Place, Oriented to Self Alcohol / Substance Use: Not Applicable Psych Involvement: No (comment)  Admission diagnosis:  COPD exacerbation ([place]) [J44.1] COPD with acute exacerbation ([place]) [J44.1] Patient Active Problem List   Diagnosis Date Noted  . HLD (hyperlipidemia) 01/05/2020  . Vertebral compression fracture ([place]) 05/29/2019  . Chronic anemia 05/29/2019  . Pulmonary nodules 05/29/2019  . Viral pneumonitis 05/28/2019  . COPD with acute exacerbation ([place]) 05/28/2019  . Chronic anticoagulation 04/06/2019  . Coronary artery calcification 04/06/2019  . Atherosclerosis of aorta ([place]) 04/06/2019  . Paroxysmal atrial fibrillation (HCC)   . Acute on chronic respiratory failure with hypoxia ([place])   . COPD exacerbation ([place]) 03/19/2019  . Lung nodule < 6cm on CT 05/29/2016  . Former cigarette smoker 12/05/2015  . Chronic respiratory failure with hypoxia ([place]) 01/27/2013  . COPD GOLD  III     PCP:  [name], MD Pharmacy:   [facility] [address], [place] [address] Phone: [phone] Fax: [phone]     Social Determinants of Health (SDOH) Interventions    Readmission Risk Interventions Readmission Risk Prevention Plan 06/01/2019 05/06/2019  Transportation Screening Complete Complete  PCP or Specialist Appt within 3-5 Days Not Complete Complete  Not Complete comments not ready for discharge  -  [place] or [place] Complete Complete  Social Work  Consult for Recovery Care Planning/Counseling Complete Complete  Palliative Care Screening Not Applicable Complete  Medication Review (RN Care Manager) Complete Complete  Some recent data might be hidden

## 2020-01-05 NOTE — Evaluation (Signed)
Physical Therapy Evaluation Patient Details Name: Paul [name] MRN: [id number] DOB: May 02, 1946 Today's Date: 01/05/2020   History of Present Illness  Pt is a 74 y/o male admitted secondary to SOB and coughing. COVID test was negative. Pt found to have a COPD exacerbation. PMH including but not limited to a-fib.    Clinical Impression  Pt presented supine in bed with HOB elevated, awake and willing to participate in therapy session. Prior to admission, pt reported that he was independent with all functional mobility and ADLs. Pt lives with his wife in a single level townhouse with a level entry. At the time of evaluation, pt overall moving very well; however, he was limited with ambulation secondary to increased WOB and DOE. Pt on RA throughout with SPO2 decreasing to as low as 83% with activity. After sitting rest break of several minutes, pt's SPO2 increasing to low 90's. Supplemental O2 reapplied at end of session and pt requesting RN to listen to his breathing. RN was notified. PT will continue to follow pt acutely to progress mobility as tolerated to ensure a safe d/c home.    Follow Up Recommendations No PT follow up    Equipment Recommendations  None recommended by PT    Recommendations for Other Services       Precautions / Restrictions Precautions Precautions: Fall Precaution Comments: monitor SPO2 Restrictions Weight Bearing Restrictions: No      Mobility  Bed Mobility Overal bed mobility: Modified Independent                Transfers Overall transfer level: Modified independent                  Ambulation/Gait Ambulation/Gait assistance: Supervision Gait Distance (Feet): 75 Feet(75' x2 with one sitting rest break) Assistive device: None Gait Pattern/deviations: Step-through pattern;Decreased stride length Gait velocity: decreased   General Gait Details: pt overall steady without use of an AD or any UE supports; no LOB or need for physical assistance,  supervision for safety. Pt limited secondary to increased WOB and SOB, requiring a sitting rest break  Stairs            Wheelchair Mobility    Modified Rankin (Stroke Patients Only)       Balance Overall balance assessment: No apparent balance deficits (not formally assessed)                                           Pertinent Vitals/Pain Pain Assessment: No/denies pain    Home Living Family/patient expects to be discharged to:: Private residence Living Arrangements: Spouse/significant other Available Help at Discharge: Family;Available 24 hours/day Type of Home: Other(Comment)(townhouse) Home Access: Level entry     Home Layout: One level Home Equipment: None      Prior Function Level of Independence: Independent         Comments: wears 2L of O2 at night only     Hand Dominance        Extremity/Trunk Assessment   Upper Extremity Assessment Upper Extremity Assessment: Defer to OT evaluation;Overall [facility] for tasks assessed    Lower Extremity Assessment Lower Extremity Assessment: Overall WFL for tasks assessed       Communication   Communication: No difficulties  Cognition Arousal/Alertness: Awake/alert Behavior During Therapy: WFL for tasks assessed/performed Overall Cognitive Status: Within Functional Limits for tasks assessed  General Comments      Exercises     Assessment/Plan    PT Assessment Patient needs continued PT services  PT Problem List Cardiopulmonary status limiting activity       PT Treatment Interventions DME instruction;Stair training;Functional mobility training;Gait training;Therapeutic activities;Therapeutic exercise;Balance training;Neuromuscular re-education;Patient/family education    PT Goals (Current goals can be found in the Care Plan section)  Acute Rehab PT Goals Patient Stated Goal: for his breathing to be better PT Goal  Formulation: With patient Time For Goal Achievement: 01/19/20 Potential to Achieve Goals: Good    Frequency Min 3X/week   Barriers to discharge        Co-evaluation               AM-PAC PT "6 Clicks" Mobility  Outcome Measure Help needed turning from your back to your side while in a flat bed without using bedrails?: None Help needed moving from lying on your back to sitting on the side of a flat bed without using bedrails?: None Help needed moving to and from a bed to a chair (including a wheelchair)?: None Help needed standing up from a chair using your arms (e.g., wheelchair or bedside chair)?: None Help needed to walk in hospital room?: None Help needed climbing 3-5 steps with a railing? : A Little 6 Click Score: 23    End of Session   Activity Tolerance: Patient tolerated treatment well Patient left: in bed;with call bell/phone within reach Nurse Communication: Mobility status PT Visit Diagnosis: Other abnormalities of gait and mobility (R26.89)    Time: [id number] PT Time Calculation (min) (ACUTE ONLY): 21 min   Charges:   PT Evaluation $PT Eval Moderate Complexity: 1 Mod          Eduard Clos, PT, DPT  Acute Rehabilitation Services Pager [phone] Office [place] 01/05/2020, 1:12 PM

## 2020-01-05 NOTE — Progress Notes (Signed)
Initial Nutrition Assessment  RD working remotely.  DOCUMENTATION CODES:   Not applicable  INTERVENTION:   -Ensure Enlive po BID, each supplement provides 350 kcal and 20 grams of protein -MVI with minerals daily  NUTRITION DIAGNOSIS:   Increased nutrient needs related to chronic illness(COPD) as evidenced by estimated needs.  GOAL:   Patient will meet greater than or equal to 90% of their needs  MONITOR:   PO intake, Supplement acceptance, Labs, Weight trends, Skin, I & O's  REASON FOR ASSESSMENT:   Consult Assessment of nutrition requirement/status  ASSESSMENT:   Paul [name] is a 74 y.o. male with medical history significant of A. fib, COPD, chronic respiratory failure on 2 L home oxygen, hyperlipidemia presented to the ED with complaints of shortness of breath.  Patient states he has had shortness of breath and a cough productive of green-colored sputum for the past 1 week.  He is on chronic steroids for his COPD and spoke to his pulmonologist who recommended increasing the dose of his home prednisone.  He continued to be symptomatic and yesterday could hardly breathe which made him go to the ED.  No fevers.  No chest pain.  No rhinorrhea or sore throat.  Reports compliance with his home COPD inhalers including Trelegy and albuterol.  He normally uses 2 L supplemental oxygen at night but for the past 1 week has been requiring in the daytime as well.  Pt admitted with COPD exacerbation.   Reviewed I/O's: -651 ml x 24 hours  UOP: 720 ml x 24 hours  Attempted to speak with pt via phone, however, no answer. RD unable to obtain further nutrition-related history at this time.   Per chart review, pt's respiratory status has improved since admission, however, not at baseline.   Pt with fair appetite. Noted meal completion documented at 75% per doc flowsheets.  Reviewed wt hx; wt has been stable over the past 6 months.   Pt with increased nutrient needs due to chronic  illness (COPD) and would benefit from addition of oral nutrition supplements.   Medications reviewed and include solu-medrol.   Labs reviewed.   Diet Order:   Diet Order            Diet Heart Room service appropriate? Yes; Fluid consistency: Thin  Diet effective now              EDUCATION NEEDS:   No education needs have been identified at this time  Skin:  Skin Assessment: Reviewed RN Assessment  Last BM:  Unknown  Height:   Ht Readings from Last 1 Encounters:  01/04/20 5\' 6"  (1.676 m)    Weight:   Wt Readings from Last 1 Encounters:  01/04/20 65.3 kg    Ideal Body Weight:  64.5 kg  BMI:  Body mass index is 23.24 kg/m.  Estimated Nutritional Needs:   Kcal:  2050-2250  Protein:  105-120 grams  Fluid:  > 2 L    Paul [name] A. Jimmye Norman, RD, LDN, [place] Registered Dietitian II Certified Diabetes Care and Education Specialist Pager: [phone] After hours Pager: [phone]

## 2020-01-05 NOTE — Evaluation (Signed)
Occupational Therapy Evaluation Patient Details Name: Paul [name] MRN: [id number] DOB: 05-02-1946 Today's Date: 01/05/2020    History of Present Illness Pt is a 74 y/o male admitted secondary to SOB and coughing. COVID test was negative. Pt found to have a COPD exacerbation. PMH including but not limited to a-fib.   Clinical Impression   Pt is Mod I with mobility using no AD and min guard A - sup with LB self care due to fatiguing easily with O2 SATs dropping on RA with minimal exertion during ADL mobility and LB ADLs. Pt with impaired endurance and activity tolerance. At rest pt at 94% O2 SATs on RA, dropping to 87% during ambulation to bathroom and simulated LB bathing tasks while standing. Pt able to perform deep breathing exercises to recover to 92%. Pt would benefit from acute OT services to address impairments to maximize level of function and safety    Follow Up Recommendations  No OT follow up    Equipment Recommendations  Tub/shower seat;Other (comment)(reacher, LH bath sponge, LH shoe horn)    Recommendations for Other Services       Precautions / Restrictions Precautions Precautions: Fall Precaution Comments: monitor SPO2 Restrictions Weight Bearing Restrictions: No      Mobility Bed Mobility Overal bed mobility: Modified Independent                Transfers Overall transfer level: Modified independent                    Balance Overall balance assessment: No apparent balance deficits (not formally assessed)                                         ADL either performed or assessed with clinical judgement   ADL Overall ADL's : Needs assistance/impaired Eating/Feeding: Independent;Sitting   Grooming: Wash/dry hands;Wash/dry face;Modified independent   Upper Body Bathing: Modified independent   Lower Body Bathing: Min guard;Supervison/ safety   Upper Body Dressing : Modified independent   Lower Body Dressing: Min  guard;Supervision/safety   Toilet Transfer: Modified Independent   Toileting- Clothing Manipulation and Hygiene: Modified independent   Tub/ Shower Transfer: Modified independent   Functional mobility during ADLs: Modified independent General ADL Comments: watch O2 SATs as they decrease with minimal exertion during ADLs and ADL mobility     Vision Patient Visual Report: No change from baseline       Perception     Praxis      Pertinent Vitals/Pain Pain Assessment: No/denies pain     Hand Dominance Right   Extremity/Trunk Assessment Upper Extremity Assessment Upper Extremity Assessment: Overall WFL for tasks assessed   Lower Extremity Assessment Lower Extremity Assessment: Defer to PT evaluation   Cervical / Trunk Assessment Cervical / Trunk Assessment: Normal   Communication Communication Communication: No difficulties   Cognition Arousal/Alertness: Awake/alert Behavior During Therapy: WFL for tasks assessed/performed Overall Cognitive Status: Within Functional Limits for tasks assessed                                     General Comments       Exercises     Shoulder Instructions      Home Living Family/patient expects to be discharged to:: Private residence Living Arrangements: Spouse/significant other Available Help at Discharge: Family;Available 24 hours/day  Type of Home: Other(Comment)(townhome) Home Access: Level entry     Home Layout: One level     Bathroom Shower/Tub: Occupational psychologist: Standard Bathroom Accessibility: Yes How Accessible: Accessible via wheelchair Home Equipment: None          Prior Functioning/Environment Level of Independence: Independent        Comments: wears 2L of O2 at night only        OT Problem List: Decreased activity tolerance;Cardiopulmonary status limiting activity;Decreased knowledge of use of DME or AE      OT Treatment/Interventions: Self-care/ADL training;DME  and/or AE instruction;Energy conservation;Patient/family education    OT Goals(Current goals can be found in the care plan section) Acute Rehab OT Goals Patient Stated Goal: for his breathing to be better OT Goal Formulation: With patient Time For Goal Achievement: 01/19/20 Potential to Achieve Goals: Good ADL Goals Pt Will Perform Lower Body Bathing: with supervision;with set-up;with modified independence;with adaptive equipment Pt Will Perform Lower Body Dressing: with supervision;with set-up;with modified independence;with adaptive equipment;sit to/from stand Additional ADL Goal #1: Pt will recall and demo 3 energy conservation techniques during ADLs and ADL mobility  OT Frequency: Min 2X/week   Barriers to D/C:    no barriers       Co-evaluation              AM-PAC OT "6 Clicks" Daily Activity     Outcome Measure Help from another person eating meals?: None Help from another person taking care of personal grooming?: None Help from another person toileting, which includes using toliet, bedpan, or urinal?: None Help from another person bathing (including washing, rinsing, drying)?: A Little Help from another person to put on and taking off regular upper body clothing?: None Help from another person to put on and taking off regular lower body clothing?: A Little 6 Click Score: 22   End of Session    Activity Tolerance: Patient tolerated treatment well Patient left: in chair;with call bell/phone within reach  OT Visit Diagnosis: Other abnormalities of gait and mobility (R26.89)                Time: [id number] OT Time Calculation (min): 26 min Charges:  OT General Charges $OT Visit: 1 Visit OT Evaluation $OT Eval Low Complexity: 1 Low OT Treatments $Self Care/Home Management : 8-22 mins    Britt Bottom 01/05/2020, 1:59 PM

## 2020-01-06 ENCOUNTER — Inpatient Hospital Stay (HOSPITAL_COMMUNITY): Payer: Medicare Other

## 2020-01-06 LAB — CBC
HCT: 38.8 % — ABNORMAL LOW (ref 39.0–52.0)
Hemoglobin: 12 g/dL — ABNORMAL LOW (ref 13.0–17.0)
MCH: 25.5 pg — ABNORMAL LOW (ref 26.0–34.0)
MCHC: 30.9 g/dL (ref 30.0–36.0)
MCV: 82.4 fL (ref 80.0–100.0)
Platelets: 310 10*3/uL (ref 150–400)
RBC: 4.71 MIL/uL (ref 4.22–5.81)
RDW: 14.7 % (ref 11.5–15.5)
WBC: 19.8 10*3/uL — ABNORMAL HIGH (ref 4.0–10.5)
nRBC: 0 % (ref 0.0–0.2)

## 2020-01-06 LAB — BASIC METABOLIC PANEL
Anion gap: 12 (ref 5–15)
BUN: 22 mg/dL (ref 8–23)
CO2: 31 mmol/L (ref 22–32)
Calcium: 9 mg/dL (ref 8.9–10.3)
Chloride: 98 mmol/L (ref 98–111)
Creatinine, Ser: 0.99 mg/dL (ref 0.61–1.24)
GFR calc Af Amer: >60 mL/min (ref >60)
GFR calc non Af Amer: >60 mL/min (ref >60)
Glucose, Bld: 147 mg/dL — ABNORMAL HIGH (ref 70–99)
Potassium: 4.7 mmol/L (ref 3.5–5.1)
Sodium: 141 mmol/L (ref 135–145)

## 2020-01-06 MED ORDER — UMECLIDINIUM BROMIDE 62.5 MCG/INH IN AEPB
1.0000 | INHALATION_SPRAY | Freq: Every day | RESPIRATORY_TRACT | Status: DC
  Administered 2020-01-06 – 2020-01-11 (×6): 1 via RESPIRATORY_TRACT
  Filled 2020-01-06: qty 7

## 2020-01-06 MED ORDER — IPRATROPIUM-ALBUTEROL 0.5-2.5 (3) MG/3ML IN SOLN
RESPIRATORY_TRACT | Status: AC
  Filled 2020-01-06: qty 3

## 2020-01-06 MED ORDER — IPRATROPIUM-ALBUTEROL 0.5-2.5 (3) MG/3ML IN SOLN
3.0000 mL | Freq: Four times a day (QID) | RESPIRATORY_TRACT | Status: DC
  Administered 2020-01-06 – 2020-01-07 (×7): 3 mL via RESPIRATORY_TRACT
  Filled 2020-01-06 (×6): qty 3

## 2020-01-06 MED ORDER — FLUTICASONE FUROATE-VILANTEROL 100-25 MCG/INH IN AEPB
1.0000 | INHALATION_SPRAY | Freq: Every day | RESPIRATORY_TRACT | Status: DC
  Administered 2020-01-06 – 2020-01-11 (×6): 1 via RESPIRATORY_TRACT
  Filled 2020-01-06: qty 28

## 2020-01-06 MED ORDER — FLUTICASONE-UMECLIDIN-VILANT 100-62.5-25 MCG/INH IN AEPB: 1.0000 | INHALATION_SPRAY | Freq: Every day | RESPIRATORY_TRACT | Status: DC

## 2020-01-06 NOTE — Progress Notes (Signed)
Occupational Therapy Treatment Patient Details Name: Paul [name] MRN: [id number] DOB: November 25, 1946 Today's Date: 01/06/2020    History of present illness Pt is a 74 y/o male admitted secondary to SOB and coughing. COVID test was negative. Pt found to have a COPD exacerbation. PMH including but not limited to a-fib.   OT comments  Pt making progress with functional goals. Requires encouragement to sit up in recliner. RT gave breathing tx this morning prior to session. Pt educated in energy conservation techniques with handouts provided, reviewed possible use of  LH bath sponge for LB bathing safety, however pt did not seem to be interested. OT will continue to follow acutely  Follow Up Recommendations  No OT follow up    Equipment Recommendations  Tub/shower seat;Other (comment)(LH shoe horn, LH bath sponge)    Recommendations for Other Services      Precautions / Restrictions Precautions Precautions: Fall Precaution Comments: monitor SPO2 Restrictions Weight Bearing Restrictions: No       Mobility Bed Mobility Overal bed mobility: Modified Independent                Transfers Overall transfer level: Modified independent                    Balance Overall balance assessment: No apparent balance deficits (not formally assessed)                                         ADL either performed or assessed with clinical judgement   ADL Overall ADL's : Needs assistance/impaired     Grooming: Wash/dry hands;Wash/dry face;Modified independent Grooming Details (indicate cue type and reason): on 2 L O2 with SATs >90% with activity     Lower Body Bathing: Min guard;Supervison/ safety       Lower Body Dressing: Min guard;Supervision/safety   Toilet Transfer: Modified Independent             General ADL Comments: pt educated in energy conservation techniques with handouts provided, reviewed possible use of  LH bath sponge for LB bathing  safety, however pt did not seem to be interested     Vision Patient Visual Report: No change from baseline     Perception     Praxis      Cognition Arousal/Alertness: Awake/alert Behavior During Therapy: WFL for tasks assessed/performed Overall Cognitive Status: Within Functional Limits for tasks assessed                                          Exercises     Shoulder Instructions       General Comments      Pertinent Vitals/ Pain       Pain Assessment: No/denies pain  Home Living                                          Prior Functioning/Environment              Frequency  Min 2X/week        Progress Toward Goals  OT Goals(current goals can now be found in the care plan section)  Progress towards OT goals: Progressing toward goals  Acute Rehab OT  Goals Patient Stated Goal: for his breathing to be better  Plan Discharge plan remains appropriate    Co-evaluation                 AM-PAC OT "6 Clicks" Daily Activity     Outcome Measure   Help from another person eating meals?: None Help from another person taking care of personal grooming?: None Help from another person toileting, which includes using toliet, bedpan, or urinal?: None Help from another person bathing (including washing, rinsing, drying)?: None Help from another person to put on and taking off regular upper body clothing?: None Help from another person to put on and taking off regular lower body clothing?: None 6 Click Score: 24    End of Session Equipment Utilized During Treatment: Oxygen  OT Visit Diagnosis: Other abnormalities of gait and mobility (R26.89)   Activity Tolerance Patient tolerated treatment well   Patient Left with call bell/phone within reach;in bed   Nurse Communication          Time: [id number] OT Time Calculation (min): 21 min  Charges: OT General Charges $OT Visit: 1 Visit OT Treatments $Therapeutic  Activity: 8-22 mins     Britt Bottom 01/06/2020, 11:50 AM

## 2020-01-06 NOTE — Progress Notes (Signed)
Physical Therapy Treatment Patient Details Name: Paul [name] MRN: [id number] DOB: 1946/03/18 Today's Date: 01/06/2020    History of Present Illness Pt is a 74 y/o male admitted secondary to SOB and coughing. COVID test was negative. Pt found to have a COPD exacerbation. PMH including but not limited to a-fib.    PT Comments    Pt making steady progress with functional mobility. However, he remains limited secondary to increased DOE and increased WOB, despite SPO2 maintaining >93% throughout. Today pt requesting to ambulate with 2L of supplemental O2. Additionally he was given a breathing treatment by RT just prior to ambulation. PT will continue to f/u with pt acutely to progress mobility as tolerated and to ensure a safe d/c home.    Follow Up Recommendations  No PT follow up     Equipment Recommendations  None recommended by PT    Recommendations for Other Services       Precautions / Restrictions Precautions Precautions: Fall Precaution Comments: monitor SPO2 Restrictions Weight Bearing Restrictions: No    Mobility  Bed Mobility Overal bed mobility: Modified Independent                Transfers Overall transfer level: Modified independent                  Ambulation/Gait Ambulation/Gait assistance: Supervision Gait Distance (Feet): 150 Feet(150' + 85' with seated rest break) Assistive device: None Gait Pattern/deviations: Step-through pattern;Decreased stride length Gait velocity: decreased   General Gait Details: pt overall steady without use of an AD or any UE supports; no LOB or need for physical assistance, supervision for safety. Pt limited secondary to increased WOB and SOB, requiring a sitting rest break   Stairs             Wheelchair Mobility    Modified Rankin (Stroke Patients Only)       Balance Overall balance assessment: No apparent balance deficits (not formally assessed)                                           Cognition Arousal/Alertness: Awake/alert Behavior During Therapy: WFL for tasks assessed/performed Overall Cognitive Status: Within Functional Limits for tasks assessed                                        Exercises      General Comments        Pertinent Vitals/Pain Pain Assessment: No/denies pain    Home Living                      Prior Function            PT Goals (current goals can now be found in the care plan section) Acute Rehab PT Goals PT Goal Formulation: With patient Time For Goal Achievement: 01/19/20 Potential to Achieve Goals: Good Progress towards PT goals: Progressing toward goals    Frequency    Min 3X/week      PT Plan Current plan remains appropriate    Co-evaluation              AM-PAC PT "6 Clicks" Mobility   Outcome Measure  Help needed turning from your back to your side while in a flat bed without using bedrails?: None Help  needed moving from lying on your back to sitting on the side of a flat bed without using bedrails?: None Help needed moving to and from a bed to a chair (including a wheelchair)?: None Help needed standing up from a chair using your arms (e.g., wheelchair or bedside chair)?: None Help needed to walk in hospital room?: None Help needed climbing 3-5 steps with a railing? : A Little 6 Click Score: 23    End of Session Equipment Utilized During Treatment: Oxygen Activity Tolerance: Patient tolerated treatment well Patient left: in bed;with call bell/phone within reach Nurse Communication: Mobility status PT Visit Diagnosis: Other abnormalities of gait and mobility (R26.89)     Time: [id number] PT Time Calculation (min) (ACUTE ONLY): 23 min  Charges:  $Gait Training: 8-22 mins $Therapeutic Activity: 8-22 mins                     Anastasio Champion, DPT  Acute Rehabilitation Services Pager [phone] Office Start 01/06/2020, 9:55  AM

## 2020-01-06 NOTE — Progress Notes (Addendum)
PROGRESS NOTE    Paul [name]  [id number] DOB: 1946-03-24 DOA: 01/04/2020 PCP: [name], MD  Brief Narrative:  Per H &P: Paul [name] a 74 y.o.malewith medical history significant ofA. fib, COPD, chronic respiratory failure on 2 L home oxygen, hyperlipidemia presented to the ED with complaints of shortness of breath.Patient states he has had shortness of breath and a cough productive of green-colored sputum for the past 1 week. He is on chronic steroids for his COPD and spoke to his pulmonologist who recommended increasing the dose of his home prednisone. He continued to be symptomatic and yesterday could hardly breathe which made him go to the ED. No fevers. No chest pain. No rhinorrhea or sore throat. Reports compliance with his home COPD inhalers including Trelegy and albuterol. He normally uses 2 L supplemental oxygen at night but for the past 1 week has been requiring in the daytime as well.  ED Course:Patient was in respiratory distress on arrival to the ED. Had accessory muscle use and decreased air movement bilaterally. Satting well on 2 L home oxygen. He was treated with continuous nebulizer, Solu-Medrol 125 mg, and IV magnesium 2 g. SARS-CoV-2 rapid antigen and PCR test negative. Labs showing WBC count 14.9. High-sensitivity troponin x2 negative. Chest x-ray personally reviewed showing stable underlying emphysematous changes and pulmonary scarring; no acute cardiopulmonary findings.  Assessment & Plan:   Principal Problem:   COPD with acute exacerbation (Paul [name]) Active Problems:   Chronic respiratory failure with hypoxia (HCC)   Paroxysmal atrial fibrillation (HCC)   HLD (hyperlipidemia)  Acute COPD exacerbation: -continuous nebulizers in ER with dramatic improvement, today progress is back a few steps from yesterday, may have worked too hard with pt/ot off oxygen -Scheduled nebs QID duoneb and BID pulmicort -add back trelegy daily which he uses at  home -Solu-medrol 60 mg IV BID, since improvement is limited today will do another day IV steroids today, switch to oral Friday -CXR without acute infection but given severity will do 5 day azithromycin stop date 01/09/20 -Patient is afebrile. -Mucinex BID -Continue to monitor WBC count which is more elevated today likely due to high dose steroids -CXR ordered after reassessment -Continuous pulse ox -Supplemental oxygen as needed -PT/OT for mobilization  Chronic respiratory failure with hypoxia -Currently satting well on 2 L supplemental oxygen. -with desaturation with PT/OT. May need oxygen with activity at discharge. -Continuous pulse ox, continue supplemental oxygen  Paroxysmal atrial fibrillation: -Currently in sinus rhythm. -Continue Cardizem for rate control -Continue Eliquis for anticoagulation  Hyperlipidemia -Continue Lipitor  DVT prophylaxis: Eliquis Code Status: full Family Communication: wife updated at bedside Disposition Plan: pending clinical improvement  Consultants:   none  Procedures:   none  Antimicrobials:  Azithromycin start date 01/04/20 stop date 01/09/20  Subjective: Not breathing as well as yesterday. He was on room air about 50% of the time yesterday including with therapy. With PT O2 levels down to 83%. He is denying new cough but coughing some more stuff up today. Walking around the room but this takes more out of him. Denies chest pains. Denies abdominal pain or nausea or vomiting. No BM for about 3 days which he states is not unusual for him.   Objective: Vitals:   01/05/20 1432 01/05/20 2138 01/06/20 0526 01/06/20 0831  BP: 119/62 (!) 124/59 121/69   Pulse: 75 88 74 76  Resp: 16 16 18 18   Temp: 98.7 F (37.1 C) 98 F (36.7 C) 97.7 F (36.5 C)   TempSrc: Oral Oral  Oral   SpO2: 98% 96% 99% 98%  Weight:      Height:        Intake/Output Summary (Last 24 hours) at 01/06/2020 0956 Last data filed at 01/06/2020 0940 Gross per 24  hour  Intake 1110 ml  Output 1640 ml  Net -530 ml   Filed Weights   01/04/20 1441  Weight: 65.3 kg    Examination:  General exam: Appears calm and comfortable  Respiratory system: Clear to auscultation. Respiratory effort normal. Some dyspnea with exertion and not moving air as well as yesterday Cardiovascular system: S1 & S2 heard, RRR. No JVD, murmurs, rubs, gallops or clicks. No pedal edema. Abdomen is obese. Gastrointestinal system: Abdomen is nondistended, soft and nontender. No organomegaly or masses felt. Normal bowel sounds heard. Central nervous system: Alert and oriented. No focal neurological deficits. Extremities: Symmetric 5 x 5 power. Skin: No rashes, lesions or ulcers Psychiatry: Judgement and insight appear normal. Mood & affect appropriate.   Data Reviewed: I have personally reviewed following labs and imaging studies  CBC: Recent Labs  Lab 01/04/20 1404 01/05/20 0250 01/06/20 0258  WBC 14.9* 10.5 19.8*  NEUTROABS 13.6*  --   --   HGB 13.9 11.7* 12.0*  HCT 45.1 37.2* 38.8*  MCV 82.6 80.5 82.4  PLT 381 305 956   Basic Metabolic Panel: Recent Labs  Lab 01/04/20 1404 01/06/20 0258  NA 141 141  K 3.9 4.7  CL 92* 98  CO2 35* 31  GLUCOSE 144* 147*  BUN 15 22  CREATININE 0.76 0.99  CALCIUM 8.8* 9.0   GFR: Estimated Creatinine Clearance: 59.1 mL/min (by C-G formula based on SCr of 0.99 mg/dL). Liver Function Tests: No results for input(s): AST, ALT, ALKPHOS, BILITOT, PROT, ALBUMIN in the last 168 hours. No results for input(s): LIPASE, AMYLASE in the last 168 hours. No results for input(s): AMMONIA in the last 168 hours. Coagulation Profile: No results for input(s): INR, PROTIME in the last 168 hours. Cardiac Enzymes: No results for input(s): CKTOTAL, CKMB, CKMBINDEX, TROPONINI in the last 168 hours. BNP (last 3 results) No results for input(s): PROBNP in the last 8760 hours. HbA1C: No results for input(s): HGBA1C in the last 72 hours. CBG: No  results for input(s): GLUCAP in the last 168 hours. Lipid Profile: No results for input(s): CHOL, HDL, LDLCALC, TRIG, CHOLHDL, LDLDIRECT in the last 72 hours. Thyroid Function Tests: No results for input(s): TSH, T4TOTAL, FREET4, T3FREE, THYROIDAB in the last 72 hours. Anemia Panel: No results for input(s): VITAMINB12, FOLATE, FERRITIN, TIBC, IRON, RETICCTPCT in the last 72 hours. Sepsis Labs: No results for input(s): PROCALCITON, LATICACIDVEN in the last 168 hours.  Recent Results (from the past 240 hour(s))  SARS Coronavirus 2 Ag (30 min TAT) - Nasal Swab (BD Veritor Kit)     Status: None   Collection Time: 01/04/20  2:03 PM   Specimen: Nasal Swab (BD Veritor Kit)  Result Value Ref Range Status   SARS Coronavirus 2 Ag NEGATIVE NEGATIVE Final    Comment: (NOTE) SARS-CoV-2 antigen NOT DETECTED.  Negative results are presumptive.  Negative results do not preclude SARS-CoV-2 infection and should not be used as the sole basis for treatment or other patient management decisions, including infection  control decisions, particularly in the presence of clinical signs and  symptoms consistent with COVID-19, or in those who have been in contact with the virus.  Negative results must be combined with clinical observations, patient history, and epidemiological information. The expected result is  Negative. Fact Sheet for Patients: PodPark.tn Fact Sheet for Healthcare Providers: GiftContent.is This test is not yet approved or cleared by the [place] FDA and  has been authorized for detection and/or diagnosis of SARS-CoV-2 by FDA under an Emergency Use Authorization (EUA).  This EUA will remain in effect (meaning this test can be used) for the duration of  the COVID-19 de claration under Section 564(b)(1) of the Act, 21 U.S.C. section 360bbb-3(b)(1), unless the authorization is terminated or revoked sooner. Performed at [facility], [address] [place], [place] [address]   SARS CORONAVIRUS 2 (TAT 6-24 HRS) Nasopharyngeal Nasopharyngeal Swab     Status: None   Collection Time: 01/04/20  3:41 PM   Specimen: Nasopharyngeal Swab  Result Value Ref Range Status   SARS Coronavirus 2 NEGATIVE NEGATIVE Final    Comment: (NOTE) SARS-CoV-2 target nucleic acids are NOT DETECTED. The SARS-CoV-2 RNA is generally detectable in upper and lower respiratory specimens during the acute phase of infection. Negative results do not preclude SARS-CoV-2 infection, do not rule out co-infections with other pathogens, and should not be used as the sole basis for treatment or other patient management decisions. Negative results must be combined with clinical observations, patient history, and epidemiological information. The expected result is Negative. Fact Sheet for Patients: SugarRoll.be Fact Sheet for Healthcare Providers: https://www.woods-mathews.com/ This test is not yet approved or cleared by the [place] FDA and  has been authorized for detection and/or diagnosis of SARS-CoV-2 by FDA under an Emergency Use Authorization (EUA). This EUA will remain  in effect (meaning this test can be used) for the duration of the COVID-19 declaration under Section 56 4(b)(1) of the Act, 21 U.S.C. section 360bbb-3(b)(1), unless the authorization is terminated or revoked sooner. Performed at [facility], [place] [address] [place], [place] [address]      Radiology Studies: DG Chest Port 1 View  Result Date: 01/04/2020 CLINICAL DATA:  One week history of shortness of breath. EXAM: PORTABLE CHEST 1 VIEW COMPARISON:  08/16/2019 FINDINGS: The cardiac silhouette, mediastinal and hilar contours are within normal limits. Mild tortuosity and calcification of the thoracic aorta. Chronic underlying emphysematous changes and pulmonary scarring but no definite acute overlying pulmonary  process. No worrisome pulmonary lesions. The bony thorax is intact. IMPRESSION: No acute cardiopulmonary findings. Stable underlying emphysematous changes and pulmonary scarring. Electronically Signed   By: Marijo Sanes M.D.   On: 01/04/2020 14:24   Scheduled Meds: . apixaban  5 mg Oral BID  . atorvastatin  40 mg Oral q1800  . budesonide (PULMICORT) nebulizer solution  0.25 mg Nebulization BID  . diltiazem  360 mg Oral Daily  . feeding supplement (ENSURE ENLIVE)  237 mL Oral BID BM  . fluticasone furoate-vilanterol  1 puff Inhalation Daily   And  . umeclidinium bromide  1 puff Inhalation Daily  . furosemide  40 mg Oral Daily  . guaiFENesin  600 mg Oral BID  . ipratropium-albuterol  3 mL Nebulization QID  . loratadine  10 mg Oral Daily  . Melatonin  6 mg Oral QHS  . methylPREDNISolone (SOLU-MEDROL) injection  60 mg Intravenous Q12H  . metoprolol tartrate  50 mg Oral BID  . multivitamin with minerals  1 tablet Oral Daily  . pantoprazole  20 mg Oral BID AC   Continuous Infusions: . sodium chloride Stopped (01/04/20 1454)  . azithromycin 250 mg (01/05/20 2341)     LOS: 2 days   Time spent: 25  Hoyt Koch, MD [facility] Pager [phone]  If 7PM-7AM, please contact night-coverage [email or web address] Password [facility] 01/06/2020, 9:56 AM

## 2020-01-06 NOTE — Plan of Care (Signed)
  Problem: Education: Goal: Knowledge of General Education information will improve Description Including pain rating scale, medication(s)/side effects and non-pharmacologic comfort measures Outcome: Progressing   

## 2020-01-07 LAB — CBC WITH DIFFERENTIAL/PLATELET
Abs Immature Granulocytes: 0.1 10*3/uL — ABNORMAL HIGH (ref 0.00–0.07)
Basophils Absolute: 0 10*3/uL (ref 0.0–0.1)
Basophils Relative: 0 %
Eosinophils Absolute: 0 10*3/uL (ref 0.0–0.5)
Eosinophils Relative: 0 %
HCT: 37.4 % — ABNORMAL LOW (ref 39.0–52.0)
Hemoglobin: 11.6 g/dL — ABNORMAL LOW (ref 13.0–17.0)
Immature Granulocytes: 1 %
Lymphocytes Relative: 3 %
Lymphs Abs: 0.4 10*3/uL — ABNORMAL LOW (ref 0.7–4.0)
MCH: 25.5 pg — ABNORMAL LOW (ref 26.0–34.0)
MCHC: 31 g/dL (ref 30.0–36.0)
MCV: 82.2 fL (ref 80.0–100.0)
Monocytes Absolute: 0.3 10*3/uL (ref 0.1–1.0)
Monocytes Relative: 2 %
Neutro Abs: 12.8 10*3/uL — ABNORMAL HIGH (ref 1.7–7.7)
Neutrophils Relative %: 94 %
Platelets: 312 10*3/uL (ref 150–400)
RBC: 4.55 MIL/uL (ref 4.22–5.81)
RDW: 14.7 % (ref 11.5–15.5)
WBC: 13.6 10*3/uL — ABNORMAL HIGH (ref 4.0–10.5)
nRBC: 0 % (ref 0.0–0.2)

## 2020-01-07 MED ORDER — IPRATROPIUM-ALBUTEROL 0.5-2.5 (3) MG/3ML IN SOLN
3.0000 mL | Freq: Three times a day (TID) | RESPIRATORY_TRACT | Status: DC
  Administered 2020-01-07 – 2020-01-09 (×5): 3 mL via RESPIRATORY_TRACT
  Filled 2020-01-07 (×5): qty 3

## 2020-01-07 MED ORDER — PREDNISONE 50 MG PO TABS
80.0000 mg | ORAL_TABLET | Freq: Every day | ORAL | Status: DC
  Administered 2020-01-07 – 2020-01-11 (×5): 80 mg via ORAL
  Filled 2020-01-07 (×5): qty 1

## 2020-01-07 NOTE — Progress Notes (Signed)
PROGRESS NOTE    Paul [name]  UXL:[id number] DOB: Jul 06, 1946 DOA: 01/04/2020 PCP: [name], MD   Brief Narrative:  Per H &P:Paul [name] a 74 y.o.malewith medical history significant ofA. fib, COPD, chronic respiratory failure on 2 L home oxygen, hyperlipidemia presented to the ED with complaints of shortness of breath.Patient states he has had shortness of breath and a cough productive of green-colored sputum for the past 1 week. He is on chronic steroids for his COPD and spoke to his pulmonologist who recommended increasing the dose of his home prednisone. He continued to be symptomatic and yesterday could hardly breathe which made him go to the ED. No fevers. No chest pain. No rhinorrhea or sore throat. Reports compliance with his home COPD inhalers including Trelegy and albuterol. He normally uses 2 L supplemental oxygen at night but for the past 1 week has been requiring in the daytime as well.  Interim history: After initial improvement he did back slide with breathing. Increased nebulizer treatments to QID scheduled which has improved some. Will change steroids to oral today with plan for home in 1-2 days.   Assessment & Plan:   Principal Problem:   COPD with acute exacerbation ([place]) Active Problems:   Chronic respiratory failure with hypoxia (HCC)   Paroxysmal atrial fibrillation (HCC)   HLD (hyperlipidemia)  Acute COPD exacerbation: -continuous nebulizers in ER with dramatic improvement, today progress is better with QID nebs -Scheduled nebs QID duoneb and BID pulmicort -trelegy daily which he uses at home -Solu-medrol 60 mg IV BID stop today and change to prednisone 80 mg daily, will need extended course as he is chronically on 20 mg prednisone and has had several flares recently with too rapid tapers, would recommend 80 mg daily 5 days, 60 mg daily 1 week, 40 mg daily 1 week, then 20 mg daily ongoing -CXR repeated 01/06/20 without acute infection but given  severity of flare will do 5 day azithromycin stop date 01/09/20 -Patient is afebrile -MucinexBID -Continue to monitor WBC count which is improving -Continuous pulse ox -Supplemental oxygen as needed -PT/OT for mobilization  Chronic respiratory failure with hypoxia -Currently satting well on 2-3 L supplemental oxygen. -with desaturation with PT/OT, on O2 night time and prn at home, will likely need increase in his normal oxygen with D/C -Continuous pulse ox, continue supplemental oxygen  Paroxysmal atrial fibrillation: -Currently in sinus rhythm. -Continue Cardizem for rate control -Continue Eliquis for anticoagulation  Hyperlipidemia -Continue Lipitor  DVT prophylaxis: Eliquis Code Status: full Family Communication: patient only Disposition Plan: likely 01/08/20 or 01/09/20 depending on clinical status   Consultants:  none  Procedures:   none  Antimicrobials:   Azithromycin start date 01/05/20, stop date 01/09/20  Subjective: Feeling better today, QID nebs yesterday really helped breathing. Denies much cough today. He was coughing a lot yesterday. Has been up to bathroom with O2 levels dropping to high 80s. Any movement even repositioning in bed and O2 levels to high 80s.   Objective: Vitals:   01/06/20 1544 01/06/20 2100 01/07/20 0440 01/07/20 0838  BP: (!) 123/53 135/63 117/64   Pulse: 79 85 73 75  Resp: '16 20 18 16  '$ Temp: 98.3 F (36.8 C) 97.6 F (36.4 C) 97.9 F (36.6 C)   TempSrc: Oral Oral Oral   SpO2: 93% 99% 98% 95%  Weight:      Height:        Intake/Output Summary (Last 24 hours) at 01/07/2020 1052 Last data filed at 01/07/2020 0901 Gross per  24 hour  Intake 1185 ml  Output 1400 ml  Net -215 ml   Filed Weights   01/04/20 1441  Weight: 65.3 kg   Examination:  General exam: Appears calm and comfortable  Respiratory system: Clear to auscultation. Respiratory effort normal. Air flow improved from yesterday Cardiovascular system: S1 & S2  heard, RRR. No JVD, murmurs, rubs, gallops or clicks. No pedal edema. Gastrointestinal system: Abdomen is nondistended, soft and nontender. No organomegaly or masses felt. Normal bowel sounds heard. Obese Central nervous system: Alert and oriented. No focal neurological deficits. Extremities: Symmetric 5 x 5 power. Skin: No rashes, lesions or ulcers Psychiatry: Judgement and insight appear normal. Mood & affect appropriate.   Data Reviewed: I have personally reviewed following labs and imaging studies  CBC: Recent Labs  Lab 01/04/20 1404 01/05/20 0250 01/06/20 0258 01/07/20 0416  WBC 14.9* 10.5 19.8* 13.6*  NEUTROABS 13.6*  --   --  12.8*  HGB 13.9 11.7* 12.0* 11.6*  HCT 45.1 37.2* 38.8* 37.4*  MCV 82.6 80.5 82.4 82.2  PLT 381 305 310 875   Basic Metabolic Panel: Recent Labs  Lab 01/04/20 1404 01/06/20 0258  NA 141 141  K 3.9 4.7  CL 92* 98  CO2 35* 31  GLUCOSE 144* 147*  BUN 15 22  CREATININE 0.76 0.99  CALCIUM 8.8* 9.0   GFR: Estimated Creatinine Clearance: 59.1 mL/min (by C-G formula based on SCr of 0.99 mg/dL). Liver Function Tests: No results for input(s): AST, ALT, ALKPHOS, BILITOT, PROT, ALBUMIN in the last 168 hours. No results for input(s): LIPASE, AMYLASE in the last 168 hours. No results for input(s): AMMONIA in the last 168 hours. Coagulation Profile: No results for input(s): INR, PROTIME in the last 168 hours. Cardiac Enzymes: No results for input(s): CKTOTAL, CKMB, CKMBINDEX, TROPONINI in the last 168 hours. BNP (last 3 results) No results for input(s): PROBNP in the last 8760 hours. HbA1C: No results for input(s): HGBA1C in the last 72 hours. CBG: No results for input(s): GLUCAP in the last 168 hours. Lipid Profile: No results for input(s): CHOL, HDL, LDLCALC, TRIG, CHOLHDL, LDLDIRECT in the last 72 hours. Thyroid Function Tests: No results for input(s): TSH, T4TOTAL, FREET4, T3FREE, THYROIDAB in the last 72 hours. Anemia Panel: No results for  input(s): VITAMINB12, FOLATE, FERRITIN, TIBC, IRON, RETICCTPCT in the last 72 hours. Sepsis Labs: No results for input(s): PROCALCITON, LATICACIDVEN in the last 168 hours.  Recent Results (from the past 240 hour(s))  SARS Coronavirus 2 Ag (30 min TAT) - Nasal Swab (BD Veritor Kit)     Status: None   Collection Time: 01/04/20  2:03 PM   Specimen: Nasal Swab (BD Veritor Kit)  Result Value Ref Range Status   SARS Coronavirus 2 Ag NEGATIVE NEGATIVE Final    Comment: (NOTE) SARS-CoV-2 antigen NOT DETECTED.  Negative results are presumptive.  Negative results do not preclude SARS-CoV-2 infection and should not be used as the sole basis for treatment or other patient management decisions, including infection  control decisions, particularly in the presence of clinical signs and  symptoms consistent with COVID-19, or in those who have been in contact with the virus.  Negative results must be combined with clinical observations, patient history, and epidemiological information. The expected result is Negative. Fact Sheet for Patients: PodPark.tn Fact Sheet for Healthcare Providers: GiftContent.is This test is not yet approved or cleared by the [place] FDA and  has been authorized for detection and/or diagnosis of SARS-CoV-2 by FDA under an Emergency  Use Authorization (EUA).  This EUA will remain in effect (meaning this test can be used) for the duration of  the COVID-19 de claration under Section 564(b)(1) of the Act, 21 U.S.C. section 360bbb-3(b)(1), unless the authorization is terminated or revoked sooner. Performed at [facility], [address] [place], [place] [address]   SARS CORONAVIRUS 2 (TAT 6-24 HRS) Nasopharyngeal Nasopharyngeal Swab     Status: None   Collection Time: 01/04/20  3:41 PM   Specimen: Nasopharyngeal Swab  Result Value Ref Range Status   SARS Coronavirus 2 NEGATIVE NEGATIVE Final     Comment: (NOTE) SARS-CoV-2 target nucleic acids are NOT DETECTED. The SARS-CoV-2 RNA is generally detectable in upper and lower respiratory specimens during the acute phase of infection. Negative results do not preclude SARS-CoV-2 infection, do not rule out co-infections with other pathogens, and should not be used as the sole basis for treatment or other patient management decisions. Negative results must be combined with clinical observations, patient history, and epidemiological information. The expected result is Negative. Fact Sheet for Patients: SugarRoll.be Fact Sheet for Healthcare Providers: https://www.woods-mathews.com/ This test is not yet approved or cleared by the [place] FDA and  has been authorized for detection and/or diagnosis of SARS-CoV-2 by FDA under an Emergency Use Authorization (EUA). This EUA will remain  in effect (meaning this test can be used) for the duration of the COVID-19 declaration under Section 56 4(b)(1) of the Act, 21 U.S.C. section 360bbb-3(b)(1), unless the authorization is terminated or revoked sooner. Performed at [facility], [place] [address], [place], [place] [address]      Radiology Studies: DG Chest 2 View  Result Date: 01/06/2020 CLINICAL DATA:  Shortness of breath, productive cough. EXAM: CHEST - 2 VIEW COMPARISON:  January 04, 2020. FINDINGS: The heart size and mediastinal contours are within normal limits. Both lungs are clear. No pneumothorax or pleural effusion is noted. The visualized skeletal structures are unremarkable. IMPRESSION: No active cardiopulmonary disease. Electronically Signed   By: Marijo Conception M.D.   On: 01/06/2020 15:14   Scheduled Meds: . apixaban  5 mg Oral BID  . atorvastatin  40 mg Oral q1800  . budesonide (PULMICORT) nebulizer solution  0.25 mg Nebulization BID  . diltiazem  360 mg Oral Daily  . feeding supplement (ENSURE ENLIVE)  237 mL Oral BID BM  .  fluticasone furoate-vilanterol  1 puff Inhalation Daily   And  . umeclidinium bromide  1 puff Inhalation Daily  . furosemide  40 mg Oral Daily  . guaiFENesin  600 mg Oral BID  . ipratropium-albuterol  3 mL Nebulization QID  . loratadine  10 mg Oral Daily  . Melatonin  6 mg Oral QHS  . metoprolol tartrate  50 mg Oral BID  . multivitamin with minerals  1 tablet Oral Daily  . pantoprazole  20 mg Oral BID AC  . predniSONE  80 mg Oral Q breakfast   Continuous Infusions: . sodium chloride Stopped (01/04/20 1454)  . azithromycin 250 mg (01/07/20 0123)     LOS: 3 days   Time spent: [place], MD [facility] Pager [phone]  If 7PM-7AM, please contact night-coverage [email or web address] Password [facility] 01/07/2020, 10:52 AM

## 2020-01-08 DIAGNOSIS — J441 Chronic obstructive pulmonary disease with (acute) exacerbation: Secondary | ICD-10-CM

## 2020-01-08 LAB — CBC
HCT: 36.6 % — ABNORMAL LOW (ref 39.0–52.0)
Hemoglobin: 11.7 g/dL — ABNORMAL LOW (ref 13.0–17.0)
MCH: 25.7 pg — ABNORMAL LOW (ref 26.0–34.0)
MCHC: 32 g/dL (ref 30.0–36.0)
MCV: 80.4 fL (ref 80.0–100.0)
Platelets: 286 10*3/uL (ref 150–400)
RBC: 4.55 MIL/uL (ref 4.22–5.81)
RDW: 14.5 % (ref 11.5–15.5)
WBC: 11.5 10*3/uL — ABNORMAL HIGH (ref 4.0–10.5)
nRBC: 0 % (ref 0.0–0.2)

## 2020-01-08 LAB — BASIC METABOLIC PANEL
Anion gap: 9 (ref 5–15)
BUN: 19 mg/dL (ref 8–23)
CO2: 34 mmol/L — ABNORMAL HIGH (ref 22–32)
Calcium: 8.5 mg/dL — ABNORMAL LOW (ref 8.9–10.3)
Chloride: 99 mmol/L (ref 98–111)
Creatinine, Ser: 0.81 mg/dL (ref 0.61–1.24)
GFR calc Af Amer: >60 mL/min (ref >60)
GFR calc non Af Amer: >60 mL/min (ref >60)
Glucose, Bld: 142 mg/dL — ABNORMAL HIGH (ref 70–99)
Potassium: 4.8 mmol/L (ref 3.5–5.1)
Sodium: 142 mmol/L (ref 135–145)

## 2020-01-08 NOTE — Plan of Care (Signed)

## 2020-01-08 NOTE — Progress Notes (Signed)
PROGRESS NOTE    Paul [name]  [id number] DOB: December 17, 1946 DOA: 01/04/2020 PCP: [name], MD   Brief Narrative:  Per H &P:Paul [name] a 74 y.o.malewith medical history significant ofA. fib, COPD, chronic respiratory failure on 2 L home oxygen only at nighttime, hyperlipidemia presented with acute exacerbation of chronic obstructive pulmonary disease.  Interim history: Patient reports he is doing well on 3 times daily DuoNebs.  He reports his breathing is not yet back to baseline he continues to be hypoxic with ambulation.  He is very anxious about the possibility of going home and wants to ensure his breathing is doing well and he is up walking.  Assessment & Plan:   Principal Problem:   COPD with acute exacerbation ([place]) Active Problems:   Chronic respiratory failure with hypoxia (HCC)   Paroxysmal atrial fibrillation (HCC)   HLD (hyperlipidemia)  Acute COPD exacerbation: -continuous nebulizers in ER with dramatic improvement -Scheduled nebs  3 times daily duoneb and BID pulmicort -trelegy daily which he uses at home -Status post Solu-Medrol now on prednisone 80 mg will likely need extended course could consider the following  80 mg daily 5 days, 60 mg daily 1 week, 40 mg daily 1 week, then 20 mg daily ongoing -Completes a course of azithromycin on January 17 -Patient is afebrile -MucinexBID -Continue to monitor WBC count which is improving -Continuous pulse ox -Supplemental oxygen as needed -PT/OT for mobilization  Chronic respiratory failure with hypoxia -Currently satting well on 2-3 L supplemental oxygen. -with desaturation with PT/OT, on O2 night time and prn at home, will likely need increase in his normal oxygen with D/C -Continuous pulse ox, continue supplemental oxygen  Paroxysmal atrial fibrillation: -Currently in sinus rhythm. -Continue Cardizem for rate control -Continue Eliquis for anticoagulation  Hyperlipidemia -Continue Lipitor  DVT  prophylaxis: Eliquis Code Status: full Family Communication: patient only Disposition Plan: likely 01/08/20 or 01/09/20 depending on clinical status   Consultants:  none  Procedures:   none  Antimicrobials:   Azithromycin start date 01/05/20, stop date 01/09/20  Subjective: Patient reports he feels okay today.  He reports is not yet back to his baseline.  He still has significant dyspnea with exertion.  Denies fevers or chills.  He reports he feels overall well.  Objective: Vitals:   01/08/20 0615 01/08/20 0827 01/08/20 1309 01/08/20 1337  BP: 135/65  (!) 115/55   Pulse: 69  68   Resp: 17  16   Temp: 98 F (36.7 C)  98.6 F (37 C)   TempSrc: Oral  Oral   SpO2: 100% 97% 93% 95%  Weight:      Height:        Intake/Output Summary (Last 24 hours) at 01/08/2020 1650 Last data filed at 01/08/2020 1325 Gross per 24 hour  Intake 840 ml  Output 1000 ml  Net -160 ml   Filed Weights   01/04/20 1441  Weight: 65.3 kg   Examination:  General exam: Appears calm and comfortable  Respiratory system: Clear to auscultation.  No wheezes rales or rhonchi.  Normal effort.  He is breathing comfortably on room air at this time. Cardiovascular system: S1 & S2 heard, RRR. No JVD, murmurs, rubs, gallops or clicks. No pedal edema. Gastrointestinal system: Abdomen is nondistended, soft and nontender. No organomegaly or masses felt. Normal bowel sounds heard. Obese Central nervous system: Alert and oriented. No focal neurological deficits. Extremities: Symmetric 5 x 5 power. Skin: No rashes, lesions or ulcers Psychiatry: Judgement and insight appear normal.  Mood & affect appropriate.   Data Reviewed: I have personally reviewed following labs and imaging studies  CBC: Recent Labs  Lab 01/04/20 1404 01/05/20 0250 01/06/20 0258 01/07/20 0416 01/08/20 0425  WBC 14.9* 10.5 19.8* 13.6* 11.5*  NEUTROABS 13.6*  --   --  12.8*  --   HGB 13.9 11.7* 12.0* 11.6* 11.7*  HCT 45.1 37.2* 38.8*  37.4* 36.6*  MCV 82.6 80.5 82.4 82.2 80.4  PLT 381 305 310 312 [id number]   Basic Metabolic Panel: Recent Labs  Lab 01/04/20 1404 01/06/20 0258 01/08/20 0425  NA 141 141 142  K 3.9 4.7 4.8  CL 92* 98 99  CO2 35* 31 34*  GLUCOSE 144* 147* 142*  BUN 15 22 19   CREATININE 0.76 0.99 0.81  CALCIUM 8.8* 9.0 8.5*     Radiology Studies: No results found. Scheduled Meds: . apixaban  5 mg Oral BID  . atorvastatin  40 mg Oral q1800  . budesonide (PULMICORT) nebulizer solution  0.25 mg Nebulization BID  . diltiazem  360 mg Oral Daily  . feeding supplement (ENSURE ENLIVE)  237 mL Oral BID BM  . fluticasone furoate-vilanterol  1 puff Inhalation Daily   And  . umeclidinium bromide  1 puff Inhalation Daily  . furosemide  40 mg Oral Daily  . guaiFENesin  600 mg Oral BID  . ipratropium-albuterol  3 mL Nebulization TID  . loratadine  10 mg Oral Daily  . Melatonin  6 mg Oral QHS  . metoprolol tartrate  50 mg Oral BID  . multivitamin with minerals  1 tablet Oral Daily  . pantoprazole  20 mg Oral BID AC  . predniSONE  80 mg Oral Q breakfast   Continuous Infusions: . sodium chloride Stopped (01/04/20 1454)  . azithromycin 250 mg (01/07/20 2335)     LOS: 4 days   Time spent: [place], MD [facility] Pager [phone]  If 7PM-7AM, please contact night-coverage [email or web address] Password [facility] 01/08/2020, 4:50 PM

## 2020-01-08 NOTE — Discharge Instructions (Addendum)
We have discharged you to take 60 mg prednisone daily starting Wednesday 01/12/20. Continue this dosage for 1 week. Then starting next Wednesday 01/19/20 start taking 40 mg prednisone daily. Continue this dosage for 1 week. Then resume normal 20 mg daily prednisone dosing.  We have prescribed tramadol to take for pain if needed. This is for the likely new compression fracture in the back.   We have prescribed the nose spray to use 1 spray daily. Please alternate nostrils every other day.   Talk to your doctor about a medicine called prolia to help with the strength of your bones long term to avoid more fractures.   Please do the nebulizer treatments scheduled 3 times a day when getting home for the first week. Then decrease to twice a day and in between if needed.   Chronic Obstructive Pulmonary Disease Chronic obstructive pulmonary disease (COPD) is a long-term (chronic) lung problem. When you have COPD, it is hard for air to get in and out of your lungs. Usually the condition gets worse over time, and your lungs will never return to normal. There are things you can do to keep yourself as healthy as possible.  Your doctor may treat your condition with: ? Medicines. ? Oxygen. ? Lung surgery.  Your doctor may also recommend: ? Rehabilitation. This includes steps to make your body work better. It may involve a team of specialists. ? Quitting smoking, if you smoke. ? Exercise and changes to your diet. ? Comfort measures (palliative care). Follow these instructions at home: Medicines  Take over-the-counter and prescription medicines only as told by your doctor.  Talk to your doctor before taking any cough or allergy medicines. You may need to avoid medicines that cause your lungs to be dry. Lifestyle  If you smoke, stop. Smoking makes the problem worse. If you need help quitting, ask your doctor.  Avoid being around things that make your breathing worse. This may include smoke, chemicals,  and fumes.  Stay active, but remember to rest as well.  Learn and use tips on how to relax.  Make sure you get enough sleep. Most adults need at least 7 hours of sleep every night.  Eat healthy foods. Eat smaller meals more often. Rest before meals. Controlled breathing Learn and use tips on how to control your breathing as told by your doctor. Try:  Breathing in (inhaling) through your nose for 1 second. Then, pucker your lips and breath out (exhale) through your lips for 2 seconds.  Putting one hand on your belly (abdomen). Breathe in slowly through your nose for 1 second. Your hand on your belly should move out. Pucker your lips and breathe out slowly through your lips. Your hand on your belly should move in as you breathe out.  Controlled coughing Learn and use controlled coughing to clear mucus from your lungs. Follow these steps: 1. Lean your head a little forward. 2. Breathe in deeply. 3. Try to hold your breath for 3 seconds. 4. Keep your mouth slightly open while coughing 2 times. 5. Spit any mucus out into a tissue. 6. Rest and do the steps again 1 or 2 times as needed. General instructions  Make sure you get all the shots (vaccines) that your doctor recommends. Ask your doctor about a flu shot and a pneumonia shot.  Use oxygen therapy and pulmonary rehabilitation if told by your doctor. If you need home oxygen therapy, ask your doctor if you should buy a tool to measure your  oxygen level (oximeter).  Make a COPD action plan with your doctor. This helps you to know what to do if you feel worse than usual.  Manage any other conditions you have as told by your doctor.  Avoid going outside when it is very hot, cold, or humid.  Avoid people who have a sickness you can catch (contagious).  Keep all follow-up visits as told by your doctor. This is important. Contact a doctor if:  You cough up more mucus than usual.  There is a change in the color or thickness of the  mucus.  It is harder to breathe than usual.  Your breathing is faster than usual.  You have trouble sleeping.  You need to use your medicines more often than usual.  You have trouble doing your normal activities such as getting dressed or walking around the house. Get help right away if:  You have shortness of breath while resting.  You have shortness of breath that stops you from: ? Being able to talk. ? Doing normal activities.  Your chest hurts for longer than 5 minutes.  Your skin color is more blue than usual.  Your pulse oximeter shows that you have low oxygen for longer than 5 minutes.  You have a fever.  You feel too tired to breathe normally. Summary  Chronic obstructive pulmonary disease (COPD) is a long-term lung problem.  The way your lungs work will never return to normal. Usually the condition gets worse over time. There are things you can do to keep yourself as healthy as possible.  Take over-the-counter and prescription medicines only as told by your doctor.  If you smoke, stop. Smoking makes the problem worse. This information is not intended to replace advice given to you by your health care provider. Make sure you discuss any questions you have with your health care provider. Document Revised: 11/21/2017 Document Reviewed: 01/13/2017 Elsevier Patient Education  2020 [place] on my medicine - ELIQUIS (apixaban)  This medication education was reviewed with me or my healthcare representative as part of my discharge preparation.    Why was Eliquis prescribed for you? Eliquis was prescribed for you to reduce the risk of forming blood clots that can cause a stroke if you have a medical condition called atrial fibrillation (a type of irregular heartbeat) OR to reduce the risk of a blood clots forming after orthopedic surgery.  What do You need to know about Eliquis ? Take your Eliquis TWICE DAILY - one tablet in the morning and  one tablet in the evening with or without food.  It would be best to take the doses about the same time each day.  If you have difficulty swallowing the tablet whole please discuss with your pharmacist how to take the medication safely.  Take Eliquis exactly as prescribed by your doctor and DO NOT stop taking Eliquis without talking to the doctor who prescribed the medication.  Stopping may increase your risk of developing a new clot or stroke.  Refill your prescription before you run out.  After discharge, you should have regular check-up appointments with your healthcare provider that is prescribing your Eliquis.  In the future your dose may need to be changed if your kidney function or weight changes by a significant amount or as you get older.  What do you do if you miss a dose? If you miss a dose, take it as soon as you remember on the same day and resume taking  twice daily.  Do not take more than one dose of ELIQUIS at the same time.  Important Safety Information A possible side effect of Eliquis is bleeding. You should call your healthcare provider right away if you experience any of the following: ? Bleeding from an injury or your nose that does not stop. ? Unusual colored urine (red or dark brown) or unusual colored stools (red or black). ? Unusual bruising for unknown reasons. ? A serious fall or if you hit your head (even if there is no bleeding).  Some medicines may interact with Eliquis and might increase your risk of bleeding or clotting while on Eliquis. To help avoid this, consult your healthcare provider or pharmacist prior to using any new prescription or non-prescription medications, including herbals, vitamins, non-steroidal anti-inflammatory drugs (NSAIDs) and supplements.  This website has more information on Eliquis (apixaban): [email or web address].no.

## 2020-01-08 NOTE — Plan of Care (Signed)
  Problem: Education: Goal: Knowledge of General Education information will improve Description Including pain rating scale, medication(s)/side effects and non-pharmacologic comfort measures Outcome: Progressing   

## 2020-01-09 DIAGNOSIS — I480 Paroxysmal atrial fibrillation: Secondary | ICD-10-CM

## 2020-01-09 DIAGNOSIS — E782 Mixed hyperlipidemia: Secondary | ICD-10-CM

## 2020-01-09 DIAGNOSIS — R0602 Shortness of breath: Secondary | ICD-10-CM

## 2020-01-09 MED ORDER — IPRATROPIUM-ALBUTEROL 0.5-2.5 (3) MG/3ML IN SOLN
3.0000 mL | Freq: Two times a day (BID) | RESPIRATORY_TRACT | Status: DC
  Administered 2020-01-09 – 2020-01-10 (×2): 3 mL via RESPIRATORY_TRACT
  Filled 2020-01-09 (×2): qty 3

## 2020-01-09 NOTE — Progress Notes (Signed)
PROGRESS NOTE    Paul [name]  [id number] DOB: 12-24-1945 DOA: 01/04/2020 PCP: [name], MD   Brief Narrative:  Per H &P:Paul [name] a 74 y.o.malewith medical history significant ofA. fib, COPD, chronic respiratory failure on 2 L home oxygen only at nighttime, hyperlipidemia presented with acute exacerbation of chronic obstructive pulmonary disease.  Interim history: Patient reports he is doing well on 3 times daily DuoNebs.  He reports his breathing is not yet back to baseline he continues to be hypoxic with ambulation.  He is very anxious about the possibility of going home and wants to ensure his breathing is doing well and he is up walking.  Assessment & Plan:   Principal Problem:   COPD with acute exacerbation ([place]) Active Problems:   Chronic respiratory failure with hypoxia (HCC)   Paroxysmal atrial fibrillation (HCC)   HLD (hyperlipidemia)  Acute COPD exacerbation: -continuous nebulizers in ER with dramatic improvement -Scheduled nebs  3 times daily duoneb and BID pulmicort -trelegy daily which he uses at home -Status post Solu-Medrol now on prednisone 80 mg will likely need extended course could consider the following  80 mg daily 5 days, 60 mg daily 1 week, 40 mg daily 1 week, then 20 mg daily ongoing -Completes a course of azithromycin on January 17 -Patient is afebrile -MucinexBID -Continue to monitor WBC count which is improving -Continuous pulse ox -Supplemental oxygen as needed -PT/OT for mobilization  Chronic respiratory failure with hypoxia -Currently satting well on 2-3 L supplemental oxygen. -with desaturation with PT/OT, on O2 night time and prn at home, will likely need increase in his normal oxygen with D/C -Continuous pulse ox, continue supplemental oxygen  Paroxysmal atrial fibrillation: -Currently in sinus rhythm. -Continue Cardizem for rate control -Continue Eliquis for anticoagulation  Hyperlipidemia -Continue Lipitor  DVT  prophylaxis: Eliquis Code Status: full Family Communication: patient only Disposition Plan: likely 01/09/20 depending on clinical status   Consultants:  none  Procedures:   none  Antimicrobials:   Azithromycin start date 01/05/20, stop date 01/09/20  Subjective: Patient reports he feels okay today.  He reports is not yet back to his baseline.  He still has significant dyspnea with exertion.  Denies fevers or chills.  He reports he feels overall well. Concerned about going home and needing to come back right away.   Objective: Vitals:   01/08/20 1337 01/08/20 2118 01/08/20 2122 01/09/20 0551  BP:  133/67  136/66  Pulse:  75  69  Resp:  18  18  Temp:  98 F (36.7 C)  98.1 F (36.7 C)  TempSrc:  Oral  Oral  SpO2: 95% 96% 96% 96%  Weight:      Height:        Intake/Output Summary (Last 24 hours) at 01/09/2020 0946 Last data filed at 01/09/2020 0846 Gross per 24 hour  Intake 660 ml  Output 300 ml  Net 360 ml   Filed Weights   01/04/20 1441  Weight: 65.3 kg   Examination:  General exam: Appears calm and comfortable  Respiratory system: Expiratory wheezes present at bases L >R. Normal effort.  He is breathing comfortably on room air at this time. Cardiovascular system: S1 & S2 heard, RRR. No JVD, murmurs, rubs, gallops or clicks. No pedal edema. Gastrointestinal system: Abdomen is nondistended, soft and nontender. No organomegaly or masses felt. Normal bowel sounds heard. Obese Central nervous system: Alert and oriented. No focal neurological deficits. Extremities: Symmetric 5 x 5 power. Skin: No rashes, lesions or ulcers  Psychiatry: Judgement and insight appear normal. Mood & affect appropriate.   Data Reviewed: I have personally reviewed following labs and imaging studies  CBC: Recent Labs  Lab 01/04/20 1404 01/05/20 0250 01/06/20 0258 01/07/20 0416 01/08/20 0425  WBC 14.9* 10.5 19.8* 13.6* 11.5*  NEUTROABS 13.6*  --   --  12.8*  --   HGB 13.9 11.7* 12.0*  11.6* 11.7*  HCT 45.1 37.2* 38.8* 37.4* 36.6*  MCV 82.6 80.5 82.4 82.2 80.4  PLT 381 305 310 312 [id number]   Basic Metabolic Panel: Recent Labs  Lab 01/04/20 1404 01/06/20 0258 01/08/20 0425  NA 141 141 142  K 3.9 4.7 4.8  CL 92* 98 99  CO2 35* 31 34*  GLUCOSE 144* 147* 142*  BUN 15 22 19   CREATININE 0.76 0.99 0.81  CALCIUM 8.8* 9.0 8.5*     Radiology Studies: No results found. Scheduled Meds: . apixaban  5 mg Oral BID  . atorvastatin  40 mg Oral q1800  . budesonide (PULMICORT) nebulizer solution  0.25 mg Nebulization BID  . diltiazem  360 mg Oral Daily  . feeding supplement (ENSURE ENLIVE)  237 mL Oral BID BM  . fluticasone furoate-vilanterol  1 puff Inhalation Daily   And  . umeclidinium bromide  1 puff Inhalation Daily  . furosemide  40 mg Oral Daily  . guaiFENesin  600 mg Oral BID  . ipratropium-albuterol  3 mL Nebulization TID  . loratadine  10 mg Oral Daily  . Melatonin  6 mg Oral QHS  . metoprolol tartrate  50 mg Oral BID  . multivitamin with minerals  1 tablet Oral Daily  . pantoprazole  20 mg Oral BID AC  . predniSONE  80 mg Oral Q breakfast   Continuous Infusions: . sodium chloride Stopped (01/04/20 1454)     LOS: 5 days   Time spent: [place], DO [facility]   If 7PM-7AM, please contact night-coverage [email or web address] Password [facility] 01/09/2020, 9:46 AM

## 2020-01-09 NOTE — Plan of Care (Signed)
  Problem: Education: Goal: Knowledge of General Education information will improve Description Including pain rating scale, medication(s)/side effects and non-pharmacologic comfort measures Outcome: Progressing   

## 2020-01-10 ENCOUNTER — Inpatient Hospital Stay (HOSPITAL_COMMUNITY): Payer: Medicare Other

## 2020-01-10 MED ORDER — IPRATROPIUM-ALBUTEROL 0.5-2.5 (3) MG/3ML IN SOLN
3.0000 mL | Freq: Three times a day (TID) | RESPIRATORY_TRACT | Status: DC
  Administered 2020-01-10 – 2020-01-11 (×3): 3 mL via RESPIRATORY_TRACT
  Filled 2020-01-10 (×3): qty 3

## 2020-01-10 MED ORDER — CALCITONIN (SALMON) 200 UNIT/ACT NA SOLN
1.0000 | Freq: Every day | NASAL | Status: DC
  Administered 2020-01-10 – 2020-01-11 (×2): 1 via NASAL
  Filled 2020-01-10: qty 3.7

## 2020-01-10 NOTE — Progress Notes (Signed)
Occupational Therapy Treatment Patient Details Name: Paul [name] MRN: [id number] DOB: December 20, 1946 Today's Date: 01/10/2020    History of present illness Pt is a 74 y/o male admitted secondary to SOB and coughing. COVID test was negative. Pt found to have a COPD exacerbation. PMH including but not limited to a-fib.   OT comments  Patient semi-supine in bed upon arrival, agreeable to OT. Patient participate in functional ambulation without AD requiring x2 standing rest breaks. Attempt ambulation without O2 for approx 47ft however patient desaturates to 86-87%. With standing rest break, pursed lip breathing technique and donning 2L O2, patient O2 increase to 92%. OT educate patient on energy conservation strategies and provide handout, patient did not recall receiving from prior session.   Follow Up Recommendations  No OT follow up    Equipment Recommendations  Tub/shower seat;Other (comment)(LH AE)       Precautions / Restrictions Precautions Precautions: Fall Precaution Comments: monitor SPO2 Restrictions Weight Bearing Restrictions: No       Mobility Bed Mobility Overal bed mobility: Modified Independent                Transfers Overall transfer level: Modified independent                    Balance Overall balance assessment: No apparent balance deficits (not formally assessed)                                         ADL either performed or assessed with clinical judgement   ADL                           Toilet Transfer: Modified Independent           Functional mobility during ADLs: Modified independent General ADL Comments: patient educated in energy conservation techniques with handout provided, patient did not recall handout from previous session.                Cognition Arousal/Alertness: Awake/alert Behavior During Therapy: WFL for tasks assessed/performed Overall Cognitive Status: Within Functional Limits  for tasks assessed                                                General Comments patient participate in functional ambulation without AD. On 2L O2 patient maintain 92%, attempt further ambulation on room air however desaturates to 86-87%. With standing rest break and pursed lip breathing techniques patient increase back to 92% on 2L O2.     Pertinent Vitals/ Pain       Pain Assessment: No/denies pain         Frequency  Min 2X/week        Progress Toward Goals  OT Goals(current goals can now be found in the care plan section)  Progress towards OT goals: Progressing toward goals  Acute Rehab OT Goals Patient Stated Goal: for his breathing to be better OT Goal Formulation: With patient Time For Goal Achievement: 01/19/20 Potential to Achieve Goals: Good ADL Goals Pt Will Perform Lower Body Bathing: with modified independence;with adaptive equipment Pt Will Perform Lower Body Dressing: with modified independence;with adaptive equipment Additional ADL Goal #1: Pt will recall and demo 3 energy conservation techniques during ADLs  and ADL mobility  Plan Discharge plan remains appropriate       AM-PAC OT "6 Clicks" Daily Activity     Outcome Measure   Help from another person eating meals?: None Help from another person taking care of personal grooming?: None Help from another person toileting, which includes using toliet, bedpan, or urinal?: None Help from another person bathing (including washing, rinsing, drying)?: None Help from another person to put on and taking off regular upper body clothing?: None Help from another person to put on and taking off regular lower body clothing?: None 6 Click Score: 24    End of Session Equipment Utilized During Treatment: Oxygen  OT Visit Diagnosis: Other abnormalities of gait and mobility (R26.89)   Activity Tolerance Patient tolerated treatment well   Patient Left in chair;with call bell/phone within reach           Time: 0846-0902 OT Time Calculation (min): 16 min  Charges: OT General Charges $OT Visit: 1 Visit OT Treatments $Self Care/Home Management : 8-22 mins  [place] OT office: [place] 01/10/2020, 1:34 PM

## 2020-01-10 NOTE — Progress Notes (Addendum)
PROGRESS NOTE    Paul [name]  [id number] DOB: 07/27/1946 DOA: 01/04/2020 PCP: Paul Brome, MD   Brief Narrative:  Per H &P:Jahree [name] a 74 y.o.malewith medical history significant ofA. fib, COPD, chronic respiratory failure on 2 L home oxygen only at nighttime, hyperlipidemia presented with acute exacerbation of chronic obstructive pulmonary disease.  Interim history: Patient reports he did not do as well on BID nebulizers yesterday and wants to go back to TID.  He reports his breathing is not yet back to baseline he continues to be hypoxic with ambulation to low 80s.  He is very anxious about the possibility of going home and wants to ensure his breathing is doing well and he is up walking.  Assessment & Plan:   Principal Problem:   COPD with acute exacerbation ([place]) Active Problems:   Chronic respiratory failure with hypoxia (HCC)   Paroxysmal atrial fibrillation (HCC)   HLD (hyperlipidemia)   SOB (shortness of breath)  Acute COPD exacerbation: -continuous nebulizers in ER with dramatic improvement -Scheduled nebs 3 times dailyduoneb and BIDpulmicort -trelegy daily which he uses at home -Status post Solu-Medrol now on prednisone 80 mg daily will likely need extended course could consider the following  80 mg daily 5 days, 60 mg daily 1 week, 40 mg daily 1 week, then 20 mg daily ongoing -Completed course of azithromycin on January 17 -Patient is afebrile -MucinexBID -Continue to monitor WBC countwhich is improving -Continuous pulse ox -Supplemental oxygen as needed -PT/OT for mobilization  Chronic respiratory failure with hypoxia -Currently satting well on 2-3 L supplemental oxygen. -with desaturation with PT/OT, on O2 night time and prn at home, will likely need increase in his normal oxygen with D/C -Continuous pulse ox, continue supplemental oxygen  Thoracic back pain: -midline on exam not consistent with rib fracture -DG thoracic 2 view today  consistent with old compression fractures and some change at T5 which could be new or worsening compression fracture -started calcitonin nasal daily to help -needs outpatient follow up with likely prolia or similar for chronic steroid usage and prior compression fractures as well as potential new compression fracture -does not need additional pain medication at this time  Paroxysmal atrial fibrillation: -Currently in sinus rhythm. -Continue Cardizem for rate control -Continue Eliquis for anticoagulation  Hyperlipidemia -Continue Lipitor  DVT prophylaxis: Eliquis Code Status: Full Family Communication: patient only Disposition Plan: home 01/11/20  Consultants:   none  Procedures:   none  Antimicrobials:   None, completed azithromycin 5 day course   Subjective: Feeling some better, coughed a lot yesterday. Felt something pull in his back. Has previously broken ribs coughing. Pain midline in the thoracic region. Denies blood in sputum. No change in sputum amount or color. Up walking with some desaturations if he tries to walk without oxygen. Denies fevers or chills. Overall did worse yesterday as he only had nebulizer treatment twice.   Objective: Vitals:   01/09/20 2105 01/09/20 2145 01/10/20 0518 01/10/20 0824  BP: (!) 118/45  138/68   Pulse: 70  68 70  Resp: _0 Temp: 97.9 F (36.6 C)  98.2 F (36.8 C)   TempSrc: Oral  Oral   SpO2: 94% 97% 98% 98%  Weight:      Height:        Intake/Output Summary (Last 24 hours) at 01/10/2020 0933 Last data filed at 01/10/2020 0730 Gross per 24 hour  Intake 840 ml  Output 1990 ml  Net -1150 ml   Filed  Weights   01/04/20 1441  Weight: 65.3 kg    Examination:  General exam: Appears calm and comfortable, getting nebulizer treatment Respiratory system: Clear to auscultation. Respiratory effort normal. No rales or wheezing, mild rhonchi improved from prior Cardiovascular system: S1 & S2 heard, RRR. No JVD, murmurs,  rubs, gallops or clicks. No pedal edema. Gastrointestinal system: Abdomen is nondistended, soft and nontender. No organomegaly or masses felt. Normal bowel sounds heard. Central nervous system: Alert and oriented. No focal neurological deficits. Extremities: Symmetric 5 x 5 power. Skin: No rashes, lesions or ulcers Psychiatry: Judgement and insight appear normal. Mood & affect appropriate.   Data Reviewed: I have personally reviewed following labs and imaging studies  CBC: Recent Labs  Lab 01/04/20 1404 01/05/20 0250 01/06/20 0258 01/07/20 0416 01/08/20 0425  WBC 14.9* 10.5 19.8* 13.6* 11.5*  NEUTROABS 13.6*  --   --  12.8*  --   HGB 13.9 11.7* 12.0* 11.6* 11.7*  HCT 45.1 37.2* 38.8* 37.4* 36.6*  MCV 82.6 80.5 82.4 82.2 80.4  PLT 381 305 310 312 607   Basic Metabolic Panel: Recent Labs  Lab 01/04/20 1404 01/06/20 0258 01/08/20 0425  NA 141 141 142  K 3.9 4.7 4.8  CL 92* 98 99  CO2 35* 31 34*  GLUCOSE 144* 147* 142*  BUN _0 CREATININE 0.76 0.99 0.81  CALCIUM 8.8* 9.0 8.5*   GFR: Estimated Creatinine Clearance: 72.2 mL/min (by C-G formula based on SCr of 0.81 mg/dL). Liver Function Tests: No results for input(s): AST, ALT, ALKPHOS, BILITOT, PROT, ALBUMIN in the last 168 hours. No results for input(s): LIPASE, AMYLASE in the last 168 hours. No results for input(s): AMMONIA in the last 168 hours. Coagulation Profile: No results for input(s): INR, PROTIME in the last 168 hours. Cardiac Enzymes: No results for input(s): CKTOTAL, CKMB, CKMBINDEX, TROPONINI in the last 168 hours. BNP (last 3 results) No results for input(s): PROBNP in the last 8760 hours. HbA1C: No results for input(s): HGBA1C in the last 72 hours. CBG: No results for input(s): GLUCAP in the last 168 hours. Lipid Profile: No results for input(s): CHOL, HDL, LDLCALC, TRIG, CHOLHDL, LDLDIRECT in the last 72 hours. Thyroid Function Tests: No results for input(s): TSH, T4TOTAL, FREET4, T3FREE,  THYROIDAB in the last 72 hours. Anemia Panel: No results for input(s): VITAMINB12, FOLATE, FERRITIN, TIBC, IRON, RETICCTPCT in the last 72 hours. Sepsis Labs: No results for input(s): PROCALCITON, LATICACIDVEN in the last 168 hours.  Recent Results (from the past 240 hour(s))  SARS Coronavirus 2 Ag (30 min TAT) - Nasal Swab (BD Veritor Kit)     Status: None   Collection Time: 01/04/20  2:03 PM   Specimen: Nasal Swab (BD Veritor Kit)  Result Value Ref Range Status   SARS Coronavirus 2 Ag NEGATIVE NEGATIVE Final    Comment: (NOTE) SARS-CoV-2 antigen NOT DETECTED.  Negative results are presumptive.  Negative results do not preclude SARS-CoV-2 infection and should not be used as the sole basis for treatment or other patient management decisions, including infection  control decisions, particularly in the presence of clinical signs and  symptoms consistent with COVID-19, or in those who have been in contact with the virus.  Negative results must be combined with clinical observations, patient history, and epidemiological information. The expected result is Negative. Fact Sheet for Patients: PodPark.tn Fact Sheet for Healthcare Providers: GiftContent.is This test is not yet approved or cleared by the [place] FDA and  has been authorized for detection  and/or diagnosis of SARS-CoV-2 by FDA under an Emergency Use Authorization (EUA).  This EUA will remain in effect (meaning this test can be used) for the duration of  the COVID-19 de claration under Section 564(b)(1) of the Act, 21 U.S.C. section 360bbb-3(b)(1), unless the authorization is terminated or revoked sooner. Performed at [facility], [address] [place], [place] [address]   SARS CORONAVIRUS 2 (TAT 6-24 HRS) Nasopharyngeal Nasopharyngeal Swab     Status: None   Collection Time: 01/04/20  3:41 PM   Specimen: Nasopharyngeal Swab  Result Value Ref Range  Status   SARS Coronavirus 2 NEGATIVE NEGATIVE Final    Comment: (NOTE) SARS-CoV-2 target nucleic acids are NOT DETECTED. The SARS-CoV-2 RNA is generally detectable in upper and lower respiratory specimens during the acute phase of infection. Negative results do not preclude SARS-CoV-2 infection, do not rule out co-infections with other pathogens, and should not be used as the sole basis for treatment or other patient management decisions. Negative results must be combined with clinical observations, patient history, and epidemiological information. The expected result is Negative. Fact Sheet for Patients: SugarRoll.be Fact Sheet for Healthcare Providers: https://www.woods-mathews.com/ This test is not yet approved or cleared by the [place] FDA and  has been authorized for detection and/or diagnosis of SARS-CoV-2 by FDA under an Emergency Use Authorization (EUA). This EUA will remain  in effect (meaning this test can be used) for the duration of the COVID-19 declaration under Section 56 4(b)(1) of the Act, 21 U.S.C. section 360bbb-3(b)(1), unless the authorization is terminated or revoked sooner. Performed at [facility], [place] [address] [place], [place] [address]    Radiology Studies: No results found.  Scheduled Meds: . apixaban  5 mg Oral BID  . atorvastatin  40 mg Oral q1800  . budesonide (PULMICORT) nebulizer solution  0.25 mg Nebulization BID  . diltiazem  360 mg Oral Daily  . feeding supplement (ENSURE ENLIVE)  237 mL Oral BID BM  . fluticasone furoate-vilanterol  1 puff Inhalation Daily   And  . umeclidinium bromide  1 puff Inhalation Daily  . furosemide  40 mg Oral Daily  . guaiFENesin  600 mg Oral BID  . ipratropium-albuterol  3 mL Nebulization TID  . loratadine  10 mg Oral Daily  . Melatonin  6 mg Oral QHS  . metoprolol tartrate  50 mg Oral BID  . multivitamin with minerals  1 tablet Oral Daily  .  pantoprazole  20 mg Oral BID AC  . predniSONE  80 mg Oral Q breakfast   Continuous Infusions: . sodium chloride Stopped (01/04/20 1454)     LOS: 6 days   Time spent: 25  Hoyt Koch, MD [facility] Pager [phone]  If 7PM-7AM, please contact night-coverage [email or web address] Password [facility] 01/10/2020, 9:33 AM

## 2020-01-11 ENCOUNTER — Telehealth: Payer: Self-pay | Admitting: Cardiology

## 2020-01-11 LAB — BASIC METABOLIC PANEL
Anion gap: 9 (ref 5–15)
BUN: 15 mg/dL (ref 8–23)
CO2: 31 mmol/L (ref 22–32)
Calcium: 8.5 mg/dL — ABNORMAL LOW (ref 8.9–10.3)
Chloride: 100 mmol/L (ref 98–111)
Creatinine, Ser: 0.67 mg/dL (ref 0.61–1.24)
GFR calc Af Amer: >60 mL/min (ref >60)
GFR calc non Af Amer: >60 mL/min (ref >60)
Glucose, Bld: 113 mg/dL — ABNORMAL HIGH (ref 70–99)
Potassium: 3.6 mmol/L (ref 3.5–5.1)
Sodium: 140 mmol/L (ref 135–145)

## 2020-01-11 LAB — CBC
HCT: 38.5 % — ABNORMAL LOW (ref 39.0–52.0)
Hemoglobin: 12.5 g/dL — ABNORMAL LOW (ref 13.0–17.0)
MCH: 25.7 pg — ABNORMAL LOW (ref 26.0–34.0)
MCHC: 32.5 g/dL (ref 30.0–36.0)
MCV: 79.1 fL — ABNORMAL LOW (ref 80.0–100.0)
Platelets: 245 10*3/uL (ref 150–400)
RBC: 4.87 MIL/uL (ref 4.22–5.81)
RDW: 14.5 % (ref 11.5–15.5)
WBC: 13.3 10*3/uL — ABNORMAL HIGH (ref 4.0–10.5)
nRBC: 0 % (ref 0.0–0.2)

## 2020-01-11 MED ORDER — TRAMADOL HCL 50 MG PO TABS: 50.0000 mg | ORAL_TABLET | Freq: Three times a day (TID) | ORAL | 0 refills | Status: DC | PRN

## 2020-01-11 MED ORDER — CALCITONIN (SALMON) 200 UNIT/ACT NA SOLN: 1.0000 | Freq: Every day | NASAL | 0 refills | Status: DC

## 2020-01-11 MED ORDER — PREDNISONE 20 MG PO TABS: ORAL_TABLET | ORAL | 0 refills | Status: DC

## 2020-01-11 NOTE — Progress Notes (Signed)
[name] to be D/C'd  per MD order. Discussed with the patient and all questions fully answered.  VSS, Skin clean, dry and intact without evidence of skin break down, no evidence of skin tears noted.  IV catheter discontinued intact. Site without signs and symptoms of complications. Dressing and pressure applied.  An After Visit Summary was printed and given to the patient. Patient received prescription.  D/c education completed with patient/family including follow up instructions, medication list, d/c activities limitations if indicated, with other d/c instructions as indicated by MD - patient able to verbalize understanding, all questions fully answered.   Patient instructed to return to ED, call 911, or call MD for any changes in condition.   Patient to be escorted via [place], and D/C home via private auto.

## 2020-01-11 NOTE — Plan of Care (Signed)

## 2020-01-11 NOTE — Telephone Encounter (Signed)
Spoke with wife ([name]) on DPR who states pt was told at discharge to f/u with cardiology.  Wife asking if that is necessary.  Pt was in hospital for COPD and did not have any issues r/t his heart while there.  He denies any cardiac problems/concerns at this time.  Advised to keep f/u as previously decide (09/2020) unless something changes.  Wife states understanding and was grateful for the information.

## 2020-01-11 NOTE — Progress Notes (Signed)
PT Cancellation Note  Patient Details Name: Paul [name] MRN: [id number] DOB: [birth year]   Cancelled Treatment:    Reason Eval/Treat Not Completed: Patient declined, no reason specified. Upon arrival pt up in recliner reading. Pt politely declining PT this morning stating he is going home soon and feels comfortable going home and does not need further PT services. Pt reporting he ambulated three times yesterday and did well.   Zachary George PT, DPT 10:32 AM,01/11/20   Onelia Cadmus Drucilla Chalet 01/11/2020, 10:31 AM

## 2020-01-11 NOTE — Telephone Encounter (Signed)
New Message:  Wife of patient called. Patient was discharged from the hospital today. On the discharge papers it says for him to follow up with Dr. Marlou Porch and Cardiology. The wife says he was in for something not Cardiac related, so she is not sure why he would need a follow-up with Dr. Marlou Porch.  She will make an appt for him if that is what Dr. Marlou Porch recommends. Please let her know what the office decides.

## 2020-01-11 NOTE — Discharge Summary (Signed)
Physician Discharge Summary  Paul [name] [facility]:[id number] DOB: [birth year] DOA: 01/04/2020  PCP: [name], MD  Admit date: 01/04/2020 Discharge date: 01/11/2020  Admitted From: home Disposition: home  Recommendations for Outpatient Follow-up:  1. Follow up with PCP in 1-2 weeks 2. Keep appointment with Dr. Joya Gaskins as scheduled 3. Please obtain BMP/CBC in one week 4. Please follow up on the following pending results: consider starting prolia  Home Health:no Equipment/Devices:home oxygen, not new  Discharge Condition:stable CODE STATUS:full Diet recommendation: Heart Healthy  Brief/Interim Summary: Per H &P:Paul [name] a 74 y.o.malewith medical history significant ofA. fib, COPD, chronic respiratory failure on 2 L home oxygen only at nighttime, hyperlipidemia presented with acute exacerbation of chronic obstructive pulmonary disease. Due to the severity of his underlying lung disease with chronic prednisone usage slowly weaned down to TID nebulizers scheduled and to oral prednisone. Had some Pt/OT which caused desaturations throughout hospital stay. Due to increased coughing in the hospital he had new upper back pain. With findings of midline tenderness X-ray ordered with old compression fractures and new compression fracture in thoracic. Started calcitonin nasal spray and urged him to consider prolia. He states he tried fosamax and could not tolerate. Overall he continued to improve throughout the hospital course and was discharged home on extended prednisone taper back to home 20 mg daily. Asked to schedule nebulizer treatments TID at home for about 1 week then down to BID if able to tolerate. Has upcoming appointment with pulmonary which he is asked to keep.   Discharge Diagnoses:  Principal Problem:   COPD with acute exacerbation ([place]) Active Problems:   Chronic respiratory failure with hypoxia (HCC)   Paroxysmal atrial fibrillation (HCC)   HLD (hyperlipidemia)   SOB (shortness  of breath)  Discharge Instructions  Discharge Instructions    Call MD for:  difficulty breathing, headache or visual disturbances   Complete by: As directed    Diet - low sodium heart healthy   Complete by: As directed    Increase activity slowly   Complete by: As directed      Allergies as of 01/11/2020   No Known Allergies     Medication List    TAKE these medications   ProAir HFA 108 (90 Base) MCG/ACT inhaler Generic drug: albuterol Inhale 2 puffs into the lungs every 6 (six) hours as needed for wheezing or shortness of breath.   albuterol (2.5 MG/3ML) 0.083% nebulizer solution Commonly known as: PROVENTIL Take 3 mLs (2.5 mg total) by nebulization every 6 (six) hours as needed for wheezing or shortness of breath.   apixaban 5 MG Tabs tablet Commonly known as: ELIQUIS Take 1 tablet (5 mg total) by mouth 2 (two) times daily. Notes to patient: tonight   atorvastatin 40 MG tablet Commonly known as: LIPITOR TAKE 1 TABLET(40 MG) BY MOUTH DAILY AFTER SUPPER What changed:   how much to take  how to take this  when to take this  additional instructions Notes to patient: tonight   calcitonin (salmon) 200 UNIT/ACT nasal spray Commonly known as: MIACALCIN/FORTICAL Place 1 spray into alternate nostrils daily. Notes to patient: tomorrow   Calcium 600+D Plus Minerals 600-400 MG-UNIT Tabs Take by mouth daily. Notes to patient: Per home dose   diltiazem 360 MG 24 hr capsule Commonly known as: Cardizem CD Take 1 capsule (360 mg total) by mouth daily. Notes to patient: tomorrow   furosemide 40 MG tablet Commonly known as: LASIX Take 1 tablet (40 mg total) by mouth daily. Notes to  patient: tomorrow   guaiFENesin 600 MG 12 hr tablet Commonly known as: MUCINEX Take 600 mg by mouth daily. Notes to patient: tomorrow   loratadine 10 MG tablet Commonly known as: CLARITIN Take 10 mg by mouth daily. Notes to patient: tomorrow   Melatonin 5 MG Tabs Take 1 tablet by  mouth at bedtime. Notes to patient: tonight   metoprolol tartrate 25 MG tablet Commonly known as: LOPRESSOR Take 2 tablets (50 mg total) by mouth 2 (two) times daily. Notes to patient: tonight   omeprazole 20 MG capsule Commonly known as: PRILOSEC Take 20 mg by mouth 2 (two) times daily before a meal. Notes to patient: Per home dose   OXYGEN Inhale 2 L into the lungs continuous. With sleep only  Adapt   predniSONE 20 MG tablet Commonly known as: DELTASONE Take 3 pills daily for 1 week, then 2 pills daily for 1 week, then resume normal 20 mg daily dosing. What changed:   medication strength  additional instructions Notes to patient: tomorrow   tiZANidine 2 MG tablet Commonly known as: ZANAFLEX Take 2 mg by mouth every 6 (six) hours as needed for muscle spasms.   traMADol 50 MG tablet Commonly known as: Ultram Take 1 tablet (50 mg total) by mouth 3 (three) times daily as needed for moderate pain.   Trelegy Ellipta 100-62.5-25 MCG/INH Aepb Generic drug: Fluticasone-Umeclidin-Vilant Inhale 1 puff into the lungs daily. Notes to patient: Per home dose      Follow-up Information    Cox, Kirsten, MD. Schedule an appointment as soon as possible for a visit in 1 week(s).   Specialties: Family Medicine, Interventional Cardiology, Radiology, Anesthesiology Contact information: [address] [place] [address] [phone]        Jerline Pain, MD .   Specialty: Cardiology Contact information: [phone] [place] [address] [phone]        Elsie Stain, MD. Go to.   Specialty: Pulmonary Disease Why: upcoming appointment         No Known Allergies  Consultations:  None  Procedures/Studies: DG Chest 2 View  Result Date: 01/06/2020 CLINICAL DATA:  Shortness of breath, productive cough. EXAM: CHEST - 2 VIEW COMPARISON:  January 04, 2020. FINDINGS: The heart size and mediastinal contours are within normal  limits. Both lungs are clear. No pneumothorax or pleural effusion is noted. The visualized skeletal structures are unremarkable. IMPRESSION: No active cardiopulmonary disease. Electronically Signed   By: Marijo Conception M.D.   On: 01/06/2020 15:14   DG Thoracic Spine 2 View  Result Date: 01/10/2020 CLINICAL DATA:  Back pain since coughing spell yesterday. EXAM: THORACIC SPINE 2 VIEWS COMPARISON:  June 17, 2013 FINDINGS: A significant compression fracture of T8 is unchanged since August 16, 2019. A compression fracture T7 also appears similar/stable in the interval. Anterior wedging of T5 appears increased since August of 2020 and January 06, 2020. Mild concavity of the superior endplate of D98 is stable since August of 2020. No other interval changes. IMPRESSION: 1. There appears to be increased anterior wedging of T5 since January 06, 2020 concerning for a compression fracture with an acute component. Other compression fractures are stable. These results will be called to the ordering clinician or representative by the Radiologist Assistant, and communication documented in the PACS or zVision Dashboard. Electronically Signed   By: Dorise Bullion III M.D   On: 01/10/2020 11:15   DG Chest Port 1 View  Result  Date: 01/04/2020 CLINICAL DATA:  One week history of shortness of breath. EXAM: PORTABLE CHEST 1 VIEW COMPARISON:  08/16/2019 FINDINGS: The cardiac silhouette, mediastinal and hilar contours are within normal limits. Mild tortuosity and calcification of the thoracic aorta. Chronic underlying emphysematous changes and pulmonary scarring but no definite acute overlying pulmonary process. No worrisome pulmonary lesions. The bony thorax is intact. IMPRESSION: No acute cardiopulmonary findings. Stable underlying emphysematous changes and pulmonary scarring. Electronically Signed   By: Marijo Sanes M.D.   On: 01/04/2020 14:24    Subjective: Feeling well, breathing is doing good. Back is hurting less.  Denies chest pains. Up walking today. Denies abdominal pain, N/V/D. Overall ready to go home.   Discharge Exam: Vitals:   01/11/20 0551 01/11/20 0811  BP: 127/63   Pulse: 67 67  Resp: 19 16  Temp: 98 F (36.7 C)   SpO2: 97% 95%   Vitals:   01/10/20 2008 01/10/20 2226 01/11/20 0551 01/11/20 0811  BP:  (!) 127/58 127/63   Pulse:  75 67 67  Resp:  18 19 16   Temp:  98 F (36.7 C) 98 F (36.7 C)   TempSrc:  Oral Oral   SpO2: 95% 99% 97% 95%  Weight:      Height:        General: Pt is alert, awake, not in acute distress Cardiovascular: RRR, S1/S2 +, no rubs, no gallops Respiratory: CTA bilaterally, no wheezing, no rhonchi Abdominal: Soft, NT, ND, bowel sounds + Extremities: no edema, no cyanosis  The results of significant diagnostics from this hospitalization (including imaging, microbiology, ancillary and laboratory) are listed below for reference.     Microbiology: Recent Results (from the past 240 hour(s))  SARS Coronavirus 2 Ag (30 min TAT) - Nasal Swab (BD Veritor Kit)     Status: None   Collection Time: 01/04/20  2:03 PM   Specimen: Nasal Swab (BD Veritor Kit)  Result Value Ref Range Status   SARS Coronavirus 2 Ag NEGATIVE NEGATIVE Final    Comment: (NOTE) SARS-CoV-2 antigen NOT DETECTED.  Negative results are presumptive.  Negative results do not preclude SARS-CoV-2 infection and should not be used as the sole basis for treatment or other patient management decisions, including infection  control decisions, particularly in the presence of clinical signs and  symptoms consistent with COVID-19, or in those who have been in contact with the virus.  Negative results must be combined with clinical observations, patient history, and epidemiological information. The expected result is Negative. Fact Sheet for Patients: PodPark.tn Fact Sheet for Healthcare Providers: GiftContent.is This test is not yet approved  or cleared by the [place] FDA and  has been authorized for detection and/or diagnosis of SARS-CoV-2 by FDA under an Emergency Use Authorization (EUA).  This EUA will remain in effect (meaning this test can be used) for the duration of  the COVID-19 de claration under Section 564(b)(1) of the Act, 21 U.S.C. section 360bbb-3(b)(1), unless the authorization is terminated or revoked sooner. Performed at [facility], [address] [place], [place] [address]   SARS CORONAVIRUS 2 (TAT 6-24 HRS) Nasopharyngeal Nasopharyngeal Swab     Status: None   Collection Time: 01/04/20  3:41 PM   Specimen: Nasopharyngeal Swab  Result Value Ref Range Status   SARS Coronavirus 2 NEGATIVE NEGATIVE Final    Comment: (NOTE) SARS-CoV-2 target nucleic acids are NOT DETECTED. The SARS-CoV-2 RNA is generally detectable in upper and lower respiratory specimens during the acute phase of  infection. Negative results do not preclude SARS-CoV-2 infection, do not rule out co-infections with other pathogens, and should not be used as the sole basis for treatment or other patient management decisions. Negative results must be combined with clinical observations, patient history, and epidemiological information. The expected result is Negative. Fact Sheet for Patients: SugarRoll.be Fact Sheet for Healthcare Providers: https://www.woods-mathews.com/ This test is not yet approved or cleared by the [place] FDA and  has been authorized for detection and/or diagnosis of SARS-CoV-2 by FDA under an Emergency Use Authorization (EUA). This EUA will remain  in effect (meaning this test can be used) for the duration of the COVID-19 declaration under Section 56 4(b)(1) of the Act, 21 U.S.C. section 360bbb-3(b)(1), unless the authorization is terminated or revoked sooner. Performed at [facility], [place] [address], [place], [place] [address]      Labs: BNP  (last 3 results) Recent Labs    04/14/19 2107 05/03/19 1324 05/28/19 1724  BNP 90.4 58.4 90.3   Basic Metabolic Panel: Recent Labs  Lab 01/04/20 1404 01/06/20 0258 01/08/20 0425 01/11/20 0245  NA 141 141 142 140  K 3.9 4.7 4.8 3.6  CL 92* 98 99 100  CO2 35* 31 34* 31  GLUCOSE 144* 147* 142* 113*  BUN 15 22 19 15   CREATININE 0.76 0.99 0.81 0.67  CALCIUM 8.8* 9.0 8.5* 8.5*   Liver Function Tests: No results for input(s): AST, ALT, ALKPHOS, BILITOT, PROT, ALBUMIN in the last 168 hours. No results for input(s): LIPASE, AMYLASE in the last 168 hours. No results for input(s): AMMONIA in the last 168 hours. CBC: Recent Labs  Lab 01/04/20 1404 01/04/20 1404 01/05/20 0250 01/06/20 0258 01/07/20 0416 01/08/20 0425 01/11/20 0245  WBC 14.9*   < > 10.5 19.8* 13.6* 11.5* 13.3*  NEUTROABS 13.6*  --   --   --  12.8*  --   --   HGB 13.9   < > 11.7* 12.0* 11.6* 11.7* 12.5*  HCT 45.1   < > 37.2* 38.8* 37.4* 36.6* 38.5*  MCV 82.6   < > 80.5 82.4 82.2 80.4 79.1*  PLT 381   < > 305 310 312 286 245   < > = values in this interval not displayed.   Cardiac Enzymes: No results for input(s): CKTOTAL, CKMB, CKMBINDEX, TROPONINI in the last 168 hours. BNP: Invalid input(s): POCBNP CBG: No results for input(s): GLUCAP in the last 168 hours. D-Dimer No results for input(s): DDIMER in the last 72 hours. Hgb A1c No results for input(s): HGBA1C in the last 72 hours. Lipid Profile No results for input(s): CHOL, HDL, LDLCALC, TRIG, CHOLHDL, LDLDIRECT in the last 72 hours. Thyroid function studies No results for input(s): TSH, T4TOTAL, T3FREE, THYROIDAB in the last 72 hours.  Invalid input(s): FREET3 Anemia work up No results for input(s): VITAMINB12, FOLATE, FERRITIN, TIBC, IRON, RETICCTPCT in the last 72 hours. Urinalysis    Component Value Date/Time   COLORURINE YELLOW 03/03/2019 1325   APPEARANCEUR CLEAR 03/03/2019 1325   LABSPEC <1.005 (L) 03/03/2019 1325   PHURINE 6.5  03/03/2019 1325   GLUCOSEU NEGATIVE 03/03/2019 1325   HGBUR NEGATIVE 03/03/2019 1325   BILIRUBINUR NEGATIVE 03/03/2019 1325   KETONESUR NEGATIVE 03/03/2019 1325   PROTEINUR NEGATIVE 03/03/2019 1325   NITRITE NEGATIVE 03/03/2019 1325   LEUKOCYTESUR NEGATIVE 03/03/2019 1325   Sepsis Labs Invalid input(s): PROCALCITONIN,  WBC,  LACTICIDVEN Microbiology Recent Results (from the past 240 hour(s))  SARS Coronavirus 2 Ag (30 min TAT) -  Nasal Swab (BD Veritor Kit)     Status: None   Collection Time: 01/04/20  2:03 PM   Specimen: Nasal Swab (BD Veritor Kit)  Result Value Ref Range Status   SARS Coronavirus 2 Ag NEGATIVE NEGATIVE Final    Comment: (NOTE) SARS-CoV-2 antigen NOT DETECTED.  Negative results are presumptive.  Negative results do not preclude SARS-CoV-2 infection and should not be used as the sole basis for treatment or other patient management decisions, including infection  control decisions, particularly in the presence of clinical signs and  symptoms consistent with COVID-19, or in those who have been in contact with the virus.  Negative results must be combined with clinical observations, patient history, and epidemiological information. The expected result is Negative. Fact Sheet for Patients: PodPark.tn Fact Sheet for Healthcare Providers: GiftContent.is This test is not yet approved or cleared by the [place] FDA and  has been authorized for detection and/or diagnosis of SARS-CoV-2 by FDA under an Emergency Use Authorization (EUA).  This EUA will remain in effect (meaning this test can be used) for the duration of  the COVID-19 de claration under Section 564(b)(1) of the Act, 21 U.S.C. section 360bbb-3(b)(1), unless the authorization is terminated or revoked sooner. Performed at [facility], [address] [place], [place] [address]   SARS CORONAVIRUS 2 (TAT 6-24 HRS) Nasopharyngeal  Nasopharyngeal Swab     Status: None   Collection Time: 01/04/20  3:41 PM   Specimen: Nasopharyngeal Swab  Result Value Ref Range Status   SARS Coronavirus 2 NEGATIVE NEGATIVE Final    Comment: (NOTE) SARS-CoV-2 target nucleic acids are NOT DETECTED. The SARS-CoV-2 RNA is generally detectable in upper and lower respiratory specimens during the acute phase of infection. Negative results do not preclude SARS-CoV-2 infection, do not rule out co-infections with other pathogens, and should not be used as the sole basis for treatment or other patient management decisions. Negative results must be combined with clinical observations, patient history, and epidemiological information. The expected result is Negative. Fact Sheet for Patients: SugarRoll.be Fact Sheet for Healthcare Providers: https://www.woods-mathews.com/ This test is not yet approved or cleared by the [place] FDA and  has been authorized for detection and/or diagnosis of SARS-CoV-2 by FDA under an Emergency Use Authorization (EUA). This EUA will remain  in effect (meaning this test can be used) for the duration of the COVID-19 declaration under Section 56 4(b)(1) of the Act, 21 U.S.C. section 360bbb-3(b)(1), unless the authorization is terminated or revoked sooner. Performed at [facility], [place] [address], [place], [place] [address]      Time coordinating discharge: Over 30 minutes  SIGNED:   Hoyt Koch, MD  [facility] 01/11/2020, 12:28 PM Pager   If 7PM-7AM, please contact night-coverage [email or web address] Password [email or web address]

## 2020-01-11 NOTE — Consult Note (Signed)
   [facility] CM Inpatient Consult   01/11/2020  Paul [name] [birth year] [id number]    Patient screened for 22% high risk score for unplanned readmission and hospitalization under his Medicare/ [employer] ACO (Accountable Care Organization) benefits and to check for potential [facility] care management needs.  Patient was previously engaged by [facility]) Visual merchandiser for COPD management, transition of care and EMMI follow-up calls.   Called and spoke with patient over the phone. HIPAA verified. He reports transitioning to home with home oxygen. Discussed regarding benefits of [facility]) Care Management services. Explained that [place] Management does not interfere with or replace any services arranged by the inpatient transition of care team.   Patient verbalized having no issues with medications, pharmacy, transportation, food or resource needs at this time. He states "I have all that I need for now". Wife ([name]) and their children provide assistance and support with his needs.    Patient had politely declined further EMMI (COPD) calls or service needs with [facility] Care Management. He states "I've had this service before and I don't believe that I need it again".  Patient endorses that his primary care provider is Dr. [name] with [facility], listed as providing transition of care follow up. He understands the need to follow-up with primary care provider post discharge.     For questions or referrals, please contact:  Edwena Felty A. Aadvika Konen, BSN, RN-BC [facility] Liaison Cell: [phone]

## 2020-01-11 NOTE — TOC Progression Note (Signed)
Transition of Care [facility]) - Progression Note    Patient Details  Name: Wylliam Hanek MRN: [id number] Date of Birth: 1946/03/24  Transition of Care [facility]) CM/SW Contact  Jacalyn Lefevre Edson Snowball, RN Phone Number: 01/11/2020, 10:39 AM  Clinical Narrative:     Confirmed face sheet information. From home with wife. Has home oxygen through [place] that he normally wears at night. He does have a portable tank family can bring at discharge if needed. Has PCP and transportation to appointments.  Patient has tub/shoer seat at home already.  Expected Discharge Plan: Home/Self Care Barriers to Discharge: Continued Medical Work up  Expected Discharge Plan and Services Expected Discharge Plan: Home/Self Care   Discharge Planning Services: CM Consult Post Acute Care Choice: Durable Medical Equipment Living arrangements for the past 2 months: Single Family Home Expected Discharge Date: 01/11/20               DME Arranged: N/A         HH Arranged: NA HH Agency: NA         Social Determinants of Health (SDOH) Interventions    Readmission Risk Interventions Readmission Risk Prevention Plan 06/01/2019 05/06/2019  Transportation Screening Complete Complete  PCP or Specialist Appt within 3-5 Days Not Complete Complete  Not Complete comments not ready for discharge  -  [place] or [place] Complete Complete  Social Work Consult for [place] Planning/Counseling Complete Complete  Palliative Care Screening Not Applicable Complete  Medication Review Press photographer) Complete Complete  Some recent data might be hidden

## 2020-01-12 DIAGNOSIS — J9611 Chronic respiratory failure with hypoxia: Secondary | ICD-10-CM | POA: Diagnosis not present

## 2020-01-12 DIAGNOSIS — J441 Chronic obstructive pulmonary disease with (acute) exacerbation: Secondary | ICD-10-CM | POA: Diagnosis not present

## 2020-01-12 DIAGNOSIS — M8088XA Other osteoporosis with current pathological fracture, vertebra(e), initial encounter for fracture: Secondary | ICD-10-CM | POA: Diagnosis not present

## 2020-01-13 ENCOUNTER — Other Ambulatory Visit: Payer: Medicare Other

## 2020-01-14 ENCOUNTER — Other Ambulatory Visit: Payer: Self-pay

## 2020-01-14 NOTE — Patient Outreach (Signed)
[facility] emmi referral: Reason for alert:  Unfilled RX. Date of call to patient 01/13/20  Placed call to patient who answered and identified himself. Patient reports that he is doing "GREAT".  Reviewed reason for call due to emmi alert for unfilled RX.   Patient reports that he has all his medications and is taking them as prescribed.  PLAN: Close case as patient declines needs.   Tomasa Rand, RN, BSN, CEN [facility] [employer] [phone]

## 2020-01-18 ENCOUNTER — Other Ambulatory Visit: Payer: Medicare Other

## 2020-01-19 DIAGNOSIS — R7301 Impaired fasting glucose: Secondary | ICD-10-CM | POA: Diagnosis not present

## 2020-01-19 DIAGNOSIS — E782 Mixed hyperlipidemia: Secondary | ICD-10-CM | POA: Diagnosis not present

## 2020-01-25 ENCOUNTER — Other Ambulatory Visit: Payer: Self-pay

## 2020-01-25 ENCOUNTER — Ambulatory Visit (INDEPENDENT_AMBULATORY_CARE_PROVIDER_SITE_OTHER): Payer: Medicare Other | Admitting: Family Medicine

## 2020-01-25 ENCOUNTER — Encounter: Payer: Self-pay | Admitting: Family Medicine

## 2020-01-25 VITALS — BP 136/62 | HR 82 | Temp 96.0°F | Resp 20 | Ht 63.0 in | Wt 142.2 lb

## 2020-01-25 DIAGNOSIS — E1169 Type 2 diabetes mellitus with other specified complication: Secondary | ICD-10-CM | POA: Insufficient documentation

## 2020-01-25 DIAGNOSIS — G8929 Other chronic pain: Secondary | ICD-10-CM

## 2020-01-25 DIAGNOSIS — Z0001 Encounter for general adult medical examination with abnormal findings: Secondary | ICD-10-CM

## 2020-01-25 DIAGNOSIS — I700 Atherosclerosis of aorta: Secondary | ICD-10-CM

## 2020-01-25 DIAGNOSIS — M8000XD Age-related osteoporosis with current pathological fracture, unspecified site, subsequent encounter for fracture with routine healing: Secondary | ICD-10-CM

## 2020-01-25 DIAGNOSIS — M546 Pain in thoracic spine: Secondary | ICD-10-CM

## 2020-01-25 DIAGNOSIS — E119 Type 2 diabetes mellitus without complications: Secondary | ICD-10-CM

## 2020-01-25 DIAGNOSIS — E782 Mixed hyperlipidemia: Secondary | ICD-10-CM | POA: Diagnosis not present

## 2020-01-25 MED ORDER — TIZANIDINE HCL 2 MG PO TABS: 2.0000 mg | ORAL_TABLET | Freq: Four times a day (QID) | ORAL | 1 refills | Status: DC | PRN

## 2020-01-25 NOTE — Progress Notes (Signed)
Established Patient Office Visit  Subjective:  Patient ID: Paul [name], male    DOB: 1946/05/11  Age: 74 y.o. MRN: [id number]  CC:  Chief Complaint  Patient presents with  . Medicare Wellness    HPI Paul [name] presents for Encounter for general adult medical examination without abnormal findings  Physical ("At Risk" items are starred): Patient's last physical exam was 1 year ago . Weight: Appropriate for height (BMI less than 27%) ;  Blood Pressure: Normal (BP less than 120/80) ;  Medical History: Patient history reviewed ; Family history reviewed ;  Allergies Reviewed: No change in current allergies ;  Medications Reviewed: Medications reviewed - no changes ; Patient is current with their Pneumococcal 23, influenza, Prevnar 13, TdaP and Shingrix immunization.  Cognitive Impairment: General Appearance: Normal ; Mood/Affect: Normal ; Cognitive Impairment Testing: Normal ;  ADLs: Patient is able to eat, bathe, dress, and go to the bathroom independently. The patient is able to handle medication administration and his/her finances.  Falls: None in the last year.  Depression Screening: is negative.  Advanced Care Planning: Was Advanced Care Planning discussed? ( yes ) ; Does the patient have Advanced Directives? ( yes ) ;  Lipids: Normal lipid levels ;  Smoking: Life-long non-smoker ;  Nutrition: Healthy, well-balanced diet ;  Physical Activity: Exercises at least 3 times per week ;  Alcohol/Drug Use: Is a non-drinker ; No illicit drug use ; Patient is not afflicted from Stress Incontinence and Urge Incontinence  Safety: reviewed ; Patient wears a seat belt, has smoke detectors, has carbon monoxide detectors, practices appropriate gun safety, and wears sunscreen with extended sun exposure.  Past Medical History:  Diagnosis Date  . A-fib ([place])   . COPD (chronic obstructive pulmonary disease) ([place])   . High cholesterol     Past Surgical History:  Procedure Laterality Date  .  CATARACT EXTRACTION Right   . SKIN SURGERY     shoulders and chest    Family History  Problem Relation Age of Onset  . Heart disease Brother   . Heart disease Mother   . Heart disease Father   . Cancer Paternal Uncle     Social History   Socioeconomic History  . Marital status: Married    Spouse name: Not on file  . Number of children: 3  . Years of education: Not on file  . Highest education level: Not on file  Occupational History  . Occupation: retired    Comment: truck Geophysicist/field seismologist  Tobacco Use  . Smoking status: Former Smoker    Packs/day: 2.00    Years: 52.00    Pack years: 104.00    Types: Cigarettes    Quit date: 02/03/2009    Years since quitting: 11.0  . Smokeless tobacco: Never Used  . Tobacco comment: Counseled to remain smoke free  Substance and Sexual Activity  . Alcohol use: Yes    Alcohol/week: 0.0 standard drinks    Comment: occ  . Drug use: No  . Sexual activity: Not on file  Other Topics Concern  . Not on file  Social History Narrative   Originally from [place]. Previously worked driving a Restaurant manager, fast food. No pets currently. No mold exposure. 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    Social Determinants of Health   Financial Resource Strain:   . Difficulty of Paying Living Expenses: Not on file  Food Insecurity:   .  Worried About Charity fundraiser in the Last Year: Not on file  . Ran Out of Food in the Last Year: Not on file  Transportation Needs:   . Lack of Transportation (Medical): Not on file  . Lack of Transportation (Non-Medical): Not on file  Physical Activity: Inactive  . Days of Exercise per Week: 0 days  . Minutes of Exercise per Session: 0 min  Stress:   . Feeling of Stress : Not on file  Social Connections:   . Frequency of Communication with Friends and Family: Not on file  . Frequency of Social Gatherings with Friends and Family: Not on file  . Attends Religious  Services: Not on file  . Active Member of Clubs or Organizations: Not on file  . Attends Archivist Meetings: Not on file  . Marital Status: Not on file  Intimate Partner Violence:   . Fear of Current or Ex-Partner: Not on file  . Emotionally Abused: Not on file  . Physically Abused: Not on file  . Sexually Abused: Not on file    No facility-administered medications prior to visit.   Outpatient Medications Prior to Visit  Medication Sig Dispense Refill  . albuterol (PROAIR HFA) 108 (90 Base) MCG/ACT inhaler Inhale 2 puffs into the lungs every 6 (six) hours as needed for wheezing or shortness of breath.    Marland Kitchen albuterol (PROVENTIL) (2.5 MG/3ML) 0.083% nebulizer solution Take 3 mLs (2.5 mg total) by nebulization every 6 (six) hours as needed for wheezing or shortness of breath. 75 mL 12  . apixaban (ELIQUIS) 5 MG TABS tablet Take 1 tablet (5 mg total) by mouth 2 (two) times daily. 180 tablet 3  . atorvastatin (LIPITOR) 40 MG tablet TAKE 1 TABLET(40 MG) BY MOUTH DAILY AFTER SUPPER (Patient taking differently: Take 40 mg by mouth daily at 6 PM. ) 90 tablet 3  . calcitonin, salmon, (MIACALCIN/FORTICAL) 200 UNIT/ACT nasal spray Place 1 spray into alternate nostrils daily. 3.7 mL 0  . Calcium Carbonate-Vit D-Min (CALCIUM 600+D PLUS MINERALS) 600-400 MG-UNIT TABS Take 1 tablet by mouth 2 (two) times daily.     Marland Kitchen diltiazem (CARDIZEM CD) 360 MG 24 hr capsule Take 1 capsule (360 mg total) by mouth daily. 90 capsule 3  . Fluticasone-Umeclidin-Vilant (TRELEGY ELLIPTA) 100-62.5-25 MCG/INH AEPB Inhale 1 puff into the lungs daily. (Patient not taking: Reported on 02/02/2020) 28 each 11  . furosemide (LASIX) 40 MG tablet Take 1 tablet (40 mg total) by mouth daily. 90 tablet 3  . loratadine (CLARITIN) 10 MG tablet Take 10 mg by mouth daily.    Marland Kitchen omeprazole (PRILOSEC) 20 MG capsule Take 20 mg by mouth 2 (two) times daily before a meal.    . OXYGEN Inhale 2 L into the lungs continuous. With sleep only   Adapt    . guaiFENesin (MUCINEX) 600 MG 12 hr tablet Take 600 mg by mouth daily.    . Melatonin 5 MG TABS Take 1 tablet by mouth at bedtime.    . metoprolol tartrate (LOPRESSOR) 25 MG tablet Take 2 tablets (50 mg total) by mouth 2 (two) times daily. 360 tablet 1  . predniSONE (DELTASONE) 20 MG tablet Take 3 pills daily for 1 week, then 2 pills daily for 1 week, then resume normal 20 mg daily dosing. (Patient taking differently: 2 tablets daily) 40 tablet 0  . tiZANidine (ZANAFLEX) 2 MG tablet Take 2 mg by mouth every 6 (six) hours as needed for muscle spasms.    Marland Kitchen  traMADol (ULTRAM) 50 MG tablet Take 1 tablet (50 mg total) by mouth 3 (three) times daily as needed for moderate pain. 15 tablet 0    No Known Allergies  ROS Review of Systems  Constitutional: Negative for chills, fatigue and fever.  HENT: Negative for congestion, ear pain and sore throat.   Respiratory: Positive for cough and shortness of breath.   Cardiovascular: Negative for chest pain.  Gastrointestinal: Negative for abdominal pain, constipation, diarrhea, nausea and vomiting.  Endocrine: Negative for polydipsia, polyphagia and polyuria.  Genitourinary: Negative for dysuria and frequency.  Musculoskeletal: Negative for arthralgias and myalgias.  Neurological: Negative for dizziness and headaches.  Psychiatric/Behavioral: Negative for dysphoric mood.       No dysphoria      Objective:    Physical Exam  Constitutional: He is oriented to person, place, and time. He appears well-developed and well-nourished.  HENT:  Right Ear: Tympanic membrane and external ear normal.  Left Ear: Tympanic membrane and external ear normal.  Nose: Nose normal. No mucosal edema.  Mouth/Throat: Oropharynx is clear and moist. No oropharyngeal exudate.  Cardiovascular: Normal rate, regular rhythm, normal heart sounds and normal pulses.  No murmur heard. Pulmonary/Chest: Effort normal and breath sounds normal. No respiratory distress. He  has no wheezes. He has no rales.  Oxygen 2 L  Abdominal: Soft. Bowel sounds are normal. There is no abdominal tenderness.  Musculoskeletal:        General: No edema. Normal range of motion.  Neurological: He is alert and oriented to person, place, and time.  Psychiatric: He has a normal mood and affect.    BP 136/62 (BP Location: Left Arm, Patient Position: Sitting)   Pulse 82   Temp (!) 96 F (35.6 C) (Tympanic)   Resp 20   Ht 5\' 3"  (1.6 m)   Wt 142 lb 3.2 oz (64.5 kg)   SpO2 96%   BMI 25.19 kg/m  Wt Readings from Last 3 Encounters:  02/02/20 143 lb 15.4 oz (65.3 kg)  01/27/20 144 lb (65.3 kg)  01/25/20 142 lb 3.2 oz (64.5 kg)     Health Maintenance Due  Topic Date Due  . HEMOGLOBIN A1C  October 10, 1946  . Hepatitis C Screening  [birth year]  . FOOT EXAM  05/24/1955  . OPHTHALMOLOGY EXAM  05/24/1955  . URINE MICROALBUMIN  05/24/1955  . TETANUS/TDAP  05/23/1964  . COLONOSCOPY  05/24/1995    There are no preventive care reminders to display for this patient.  Lab Results  Component Value Date   TSH 2.539 03/22/2019   Lab Results  Component Value Date   WBC 16.6 (H) 02/02/2020   HGB 13.3 02/02/2020   HCT 43.1 02/02/2020   MCV 83.0 02/02/2020   PLT 373 02/02/2020   Lab Results  Component Value Date   NA 136 02/02/2020   K 3.5 02/02/2020   CO2 29 02/02/2020   GLUCOSE 199 (H) 02/02/2020   BUN 13 02/02/2020   CREATININE 0.66 02/02/2020   BILITOT 0.2 (L) 05/28/2019   ALKPHOS 74 05/28/2019   AST 23 05/28/2019   ALT 40 05/28/2019   PROT 7.4 05/28/2019   ALBUMIN 3.7 05/28/2019   CALCIUM 8.8 (L) 02/02/2020   ANIONGAP 11 02/02/2020   No results found for: CHOL No results found for: HDL No results found for: LDLCALC No results found for: TRIG No results found for: CHOLHDL No results found for: HGBA1C    Assessment & Plan:   Problem List Items Addressed This Visit  Endocrine   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 tiZANidine (ZANAFLEX) 2 MG tablet    Sig: Take 1 tablet (2 mg total) by mouth every 6 (six) hours as needed for muscle spasms.    Dispense:  90 tablet    Refill:  1    Follow-up: Return in about 3 months (around 04/23/2020) for 3 month visit.    [name], MD

## 2020-01-27 ENCOUNTER — Ambulatory Visit (INDEPENDENT_AMBULATORY_CARE_PROVIDER_SITE_OTHER): Payer: Medicare Other | Admitting: Internal Medicine

## 2020-01-27 ENCOUNTER — Telehealth: Payer: Self-pay | Admitting: Cardiology

## 2020-01-27 ENCOUNTER — Encounter: Payer: Self-pay | Admitting: Internal Medicine

## 2020-01-27 ENCOUNTER — Other Ambulatory Visit: Payer: Self-pay

## 2020-01-27 DIAGNOSIS — I10 Essential (primary) hypertension: Secondary | ICD-10-CM | POA: Diagnosis not present

## 2020-01-27 DIAGNOSIS — J449 Chronic obstructive pulmonary disease, unspecified: Secondary | ICD-10-CM

## 2020-01-27 DIAGNOSIS — J9611 Chronic respiratory failure with hypoxia: Secondary | ICD-10-CM | POA: Diagnosis not present

## 2020-01-27 MED ORDER — TRELEGY ELLIPTA 100-62.5-25 MCG/INH IN AEPB: 1.0000 | INHALATION_SPRAY | Freq: Every day | RESPIRATORY_TRACT | 0 refills | Status: DC

## 2020-01-27 MED ORDER — BISOPROLOL FUMARATE 5 MG PO TABS: ORAL_TABLET | ORAL | 11 refills | Status: DC

## 2020-01-27 MED ORDER — PREDNISONE 20 MG PO TABS: ORAL_TABLET | ORAL | 0 refills | Status: DC

## 2020-01-27 MED ORDER — GUAIFENESIN ER 600 MG PO TB12: 1200.0000 mg | ORAL_TABLET | Freq: Two times a day (BID) | ORAL | Status: DC | PRN

## 2020-01-27 NOTE — Patient Instructions (Addendum)
Stop lopressor and start Bisoprolol 5 mg daily in its place - if blood pressure is too high take twice daily   For cough / congestion > mucinex up to 1200 mg twice daily and use flutter  Plan A = Automatic = Always=   Use albuterol neb 1st thing in am and wait 15 minutes and then use Trelegy   Plan B = Backup (to supplement plan A, not to replace it) Only use your albuterol inhaler as a rescue medication to be used if you can't catch your breath by resting or doing a relaxed purse lip breathing pattern.  - The less you use it, the better it will work when you need it. - Ok to use the inhaler up to 2 puffs  every 4 hours if you must but call for appointment if use goes up over your usual need - Don't leave home without it !!  (think of it like the spare tire for your car)   Plan C = Crisis (instead of Plan B but only if Plan B stops working) - only use your albuterol nebulizer if you first try Plan B and it fails to help > ok to use the nebulizer up to every 4 hours but if start needing it regularly call for immediate appointment   Prednisone 20 mg daily  - ok to double if doing worse  Please schedule a follow up office visit in 4 weeks, sooner if needed  with all medications /inhalers/ solutions in hand so we can verify exactly what you are taking. This includes all medications from all doctors and over the counters

## 2020-01-27 NOTE — Telephone Encounter (Signed)
New Message     Pt c/o medication issue:  1. Name of Medication: Metoprolol   2. How are you currently taking this medication (dosage and times per day)? 25 mg 2 tablets in morning and 2 tablets at night   3. Are you having a reaction (difficulty breathing--STAT)? no  4. What is your medication issue? Pts wife is calling and says the pt saw his Pulmonologist and they told him he should not be taking this medication with his lung problems. Pts wife says the pt has COPD and the pulmonologist is taking him off of the medication    Please advise

## 2020-01-27 NOTE — Telephone Encounter (Signed)
Will forward to Dr Skains for review and any new orders. 

## 2020-01-27 NOTE — Progress Notes (Signed)
Paul [name], male    DOB: 1946-04-03,    MRN: [id number]   Brief patient profile:  37 yowm MM/Quit smoking  02/03/09  with GOLD III spirometry baseline doe x able to golf. Only using 02 at  [facility] with  maint on symbicort/ spiriva and no chronic pred/ albuterol twice daily at most  much worse mid March 2020.   PFT 05/29/16: FVC 2.08 L (86%) FEV1 0.83 L (31%) FEV1/FVC 0.40 FEF 25-75 0.30 L (14%) no bronchodilator response TLC 6.56 L (107%) RV 167% ERV 160% DLCO corrected 60% (hemoglobin 10.9) 07/15/13: FVC 2.68 L (60%) FEV1 1.63 L (53%) FEV1/FVC 0.61 FEF 25-75 0.76 L (26%) 02/04/12: FVC 2.18 L (57%) FEV1 1.09 L (36%) FEV1/FVC 0.50 FEF 25-75 0.33 L (11%)   Admit date: 03/19/2019 Discharge date: 03/25/2019   Recommendations for Outpatient Follow-up:  1. F/u with PCP within a week  for hospital discharge follow up, repeat cbc/bmp at follow up 2. F/u with cardiology for new diagnosis of afib 3. Home 02  Discharge Diagnoses:      Active Hospital Problems   Diagnosis Date Noted  . PAF (paroxysmal atrial fibrillation) ([place])   . Acute on chronic respiratory failure with hypoxia ([place])   . COPD exacerbation ([place]) 03/19/2019         Filed Weights   03/23/19 1700 03/24/19 0503 03/25/19 0528  Weight: 60.2 kg 60 kg 62.3 kg    History of present illness: (per admitting MD Dr Earnest Conroy) PCP:Cox, Elnita Maxwell, MD Patient coming from:Home  Chief Complaint:Worsening shortness of breath  [name] a 74 y.o.malewith history h/oCOPD, hyperlipidemia whoat baseline was using 2 L O2 via nasal cannula only at night and follows [facility] (Dr. Vaughan Browner) as outpatient presents with worsening shortness of breath. Patient reports that at baseline he has cough with white phlegm and is able to walk around doing his ADLs without shortness of breath and uses O2 only at night. 2 weeks back he developed productive cough with green phlegm and shortness of breath but no fevers. He  underwent CT chest with contrast on March 18 which was negative for PE or infiltrates. He apparently was given doxycycline/prednisone course at that time which helped him transiently but symptoms worsened again past Monday with shortness of breath on minimum exertion/cough and he noticed his pulse ox was dropping to 74%.He started using 2 L O2 nasal cannula continuous. On Tuesday, he called pulmonary office given persistent symptoms and also pulse oximetry showing O2 sats84% on exertion and 93% at rest in spite of using continuous O2. He was advised by Dr. Vaughan Browner to use 60 mg prednisone x3 days followed by 10 mg taper every 3 days. He was also advised to use nebulizer treatments every 3 hours and continue Symbicort inhaler twice daily. Patient was supposed to start 50 mg prednisone today but woke up feeling extremely short of breath. He states he is also been having bad bouts of cough at night with wheezing and bronchospasm.  He presented to [facility] ED where he was noted to be afebrile but hypoxic, required BiPAP for 3 hours, chest x-ray was negative for infiltrates. Patient denies any recent history of travel or known sick contacts with flu positive or coronavirus positive patients. He lives at home with his wife who is recovering from lung cancer and completed chemotherapy 1 month back.  Hospitalist service was called to request direct admission to medical floor but ED to ED transfer facilitated for thorough clinical evaluation prior  to requesting medical bed given coronavirus pandemic. In the ED at [facility], patient has been saturating well on 3 L nasal cannula but appears tachypneic on talking full sentences. He denies any chest pain, denies any fevers or leg swellings.  Hospital Course:  Active Problems:   COPD exacerbation (HCC)   PAF (paroxysmal atrial fibrillation) (HCC)   Acute on chronic respiratory failure with hypoxia (HCC)   Acute onHypoxic respiratory failure,  COPDexacerbation,Progression of severe COPD -Emphysema, with no evidence of superimposed acute cardiopulmonary disease -improving on iv steroids, oral zitrhomax, he finished total of 5 days of zithromax in the hospital, he is discharged on slow prednisone taper.  - o2 dropped to 86% while ambulating on room air, discharged on continuous home o2 ( previously on nighttime o2 only) -he is to follow with Dr Vaughan Browner    Paroxsymalafib, New diagnosis of afib -he is started on eliquis (CHADS-VASC 2, age and aortic atherosclerosis), cardizem, lasix -cardiology consulted recommend "Diltiazem CD 360. Avoid amiodarone and beta blockers given his underlying lung dz." -outpatient echo per cardiology   Aortic and multivessel coronary atherosclerosis - Changed to high intensity statin, atorvastatin 40mg  PO QD. Check lipids and ALT as outpatient in 3 months.  - ASA 81. Aggressive secondary prevention. - No chest pain.      History of Present Illness  03/29/2019  Pulmonary/ extended post hosp f/u eval/Paul [name]  Re GOLD III copd ? 02 dep now Chief Complaint  Patient presents with  . Hospitalization Follow-up    Breathing has improved some. He has not used his albuterol inhaler or neb since hospital d/c.  Dyspnea:  mb and back ok off 02 sats upper 80s at end  Cough: none Sleep: recliner x 45 degrees = baseline 02 2lpm hs  SABA use: none  Since d/c on symb 160 2bid and spiriva each pm (technique 50% on both, see a/p) On pred 50 mg daily on day as ov rec Plan A = Automatic = symbicort 160 Take 2 puffs first thing in am and then another 2 puffs about 12 hours later.  Spiriva x 2 puffs  right after symbicort  Work on inhaler technique: Plan B = Backup Only use your albuterol inhaler(Proventil/Proair)  as a rescue medication Plan C = Crisis - only use your albuterol nebulizer if you first try Plan B and it fails to help > ok to use the nebulizer up to every 4 hours but if start needing it  regularly call for immediate appointment Plan D = Doctor - call me if B and C not adequate Plan E = ER - go to ER or call 911 if all else fails   02  2lpm at bedtime and adjust to keep it over 90% with activity       06/15/2019  f/u ov/Paul [name] re:  COPD III  maint symb/spiriva on prednisone still 15 mg daily  Chief Complaint  Patient presents with  . Follow-up    Breathing has improved  Dyspnea:  10 min x 3 x daily  Due to back / no hills sats usually lower 90s RA Cough: no  Sleeping: 10 degrees SABA use: none 02: 2lpm hs  rec Prednisone 10 mg with bfast x one week then 1/2 x one week then 1/2 on even days x 2 weeks and stop At any point get worse, increase back to 20 mg daily and start over.   07/26/2019  f/u ov/Paul [name] re: copd III/ maint pred 10 mg daily  -tends to flare  on lower doses.  Despite maintenance with Symbicort and Spiriva. Chief Complaint  Patient presents with  . Follow-up    Had increased SOB when weened off pred so started back on 10 mg daily and this has helped. He is using his proair 4 x per wk on average and uses neb once per day.  Dyspnea:  10 -12 min outside and inside slowed both by back and breathing  Cough: better / min mucoid sputum production even in am Sleeping: flat  SABA use: way too much  - does not rechallenge  02: 02 2lpm hs  rec Prednisone 10 mg ceiling and 5 mg (one half) floor for now  Plan A  = symbicort, spiriva and prednisone as above Plan B Only use your albuterol as a rescue medication Plan C  Use the nebulizer up to every 4 hours if Plan B doesn't work      10/26/2019  f/u ov/Paul [name] re: GOLD III/  Steroid dep since early March 2020 on symb/spiriva and pred 20 mg daily  Chief Complaint  Patient presents with  . Follow-up    Breathing is doing well. He uses his proair once per wk on average. He has not used neb recently.   Dyspnea: walking 1.5 - 3 miles a day  Cough: none  Sleeping: hob up 6 inches mattress wedge  SABA use: as above,  rarely needed 02: 2lpm hs/ sats running in high 80's with activity  rec Plan A = Automatic = Always=    Trelegy one click each am (spiriva/ symbicort)  Plan B = Backup (to supplement plan A, not to replace it) Only use your albuterol inhaler as a rescue medication Plan C = Crisis (instead of Plan B but only if Plan B stops working) - only use your albuterol nebulizer if you first try Plan B  Try prednisone 15 mg daily if possible - increase to 20 mg daily if not doing as well    Televisit  12/27/19 recs Plan A = Automatic = Always=    Trelegy each am  Plan B = Backup (to supplement plan A, not to replace it) Only use your albuterol inhaler as a rescue medication Plan C = Crisis (instead of Plan B but only if Plan B stops working) - only use your albuterol nebulizer if you first try Plan B Plan D = Deltasone  - 2 prednisone = 20 mg / day until better then drop to 15 mg daily as tolerated  Make sure you check your oxygen saturations at highest level of activity to be sure it stays over 90% and adjust upward to maintain this level if needed but remember to turn it back to previous settings when you stop (to conserve your supply).  Late add:  Prilosec 20 mg Take 30- 60 min before your first and last meals of the day until returns and continue off the fosfamax (not taking it anyway)   GERD diet zpak    Admit date: 01/04/2020 Discharge date: 01/11/2020  Admitted From: home Disposition: home  Recommendations for Outpatient Follow-up:  1. Follow up with PCP in 1-2 weeks 2. Keep appointment with Dr. Joya Gaskins as scheduled 3. Please obtain BMP/CBC in one week 4. Please follow up on the following pending results: consider starting prolia  Home Health:no Equipment/Devices:home oxygen, not new     Brief/Interim Summary: Per H &P:Paul Paul [name] a 74 y.o.malewith medical history significant ofA. fib, COPD, chronic respiratory failure on 2 L home oxygen only at nighttime,  hyperlipidemia  presented with acute exacerbation of chronic obstructive pulmonary disease. Due to the severity of his underlying lung disease with chronic prednisone usage slowly weaned down to TID nebulizers scheduled and to oral prednisone. Had some Pt/OT which caused desaturations throughout hospital stay. Due to increased coughing in the hospital he had new upper back pain. With findings of midline tenderness X-ray ordered with old compression fractures and new compression fracture in t spine. Started calcitonin nasal spray and urged him to consider prolia. He states he tried fosamax and could not tolerate. Overall he continued to improve throughout the hospital course and was discharged home on extended prednisone taper back to home 20 mg daily. Asked to schedule nebulizer treatments TID at home for about 1 week then down to BID if able to tolerate.    Discharge Diagnoses:  Principal Problem:   COPD with acute exacerbation [facility]):   Chronic respiratory failure with hypoxia (HCC)   Paroxysmal atrial fibrillation (HCC)   HLD (hyperlipidemia)   SOB (shortness of breath)   01/27/2020  f/u ov/Paul [name] re:  Copd with persistent flare since March 2020 remains high dose steroid dep  Chief Complaint  Patient presents with  . Follow-up    Breathing is about the same. He rarely uses his albuterol inhaler. He used neb last 1 wk ago. He has occ cough with green sputum. Currently on 40 mg pred.   Dyspnea:  Across the room  Cough: some before bed / uses flutter light green only using one 600 mg mucinex qam only  but does use flutter Sleeping: wedge blocks SABA use: minimal  02: 2lpm hs / says usually above 90% walking RA    No obvious day to day or daytime variability or assoc   mucus plugs or hemoptysis or cp or chest tightness, subjective wheeze or overt sinus or hb symptoms.   Sleeping ok as above without nocturnal  or early am exacerbation  of respiratory  c/o's or need for noct saba. Also denies any obvious  fluctuation of symptoms with weather or environmental changes or other aggravating or alleviating factors except as outlined above   No unusual exposure hx or h/o childhood pna/ asthma or knowledge of premature birth.  Current Allergies, Complete Past Medical History, Past Surgical History, Family History, and Social History were reviewed in [employer] record.  ROS  The following are not active complaints unless bolded Hoarseness, sore throat, dysphagia, dental problems, itching, sneezing,  nasal congestion or discharge of excess mucus or purulent secretions, ear ache,   fever, chills, sweats, unintended wt loss or wt gain, classically pleuritic or exertional cp,  orthopnea pnd or arm/hand swelling  or leg swelling, presyncope, palpitations, abdominal pain, anorexia, nausea, vomiting, diarrhea  or change in bowel habits or change in bladder habits, change in stools or change in urine, dysuria, hematuria,  rash, arthralgias, visual complaints, headache, numbness, weakness or ataxia or problems with walking or coordination,  change in mood or  memory.        Current Meds  Medication Sig  . albuterol (PROAIR HFA) 108 (90 Base) MCG/ACT inhaler Inhale 2 puffs into the lungs every 6 (six) hours as needed for wheezing or shortness of breath.  Marland Kitchen albuterol (PROVENTIL) (2.5 MG/3ML) 0.083% nebulizer solution Take 3 mLs (2.5 mg total) by nebulization every 6 (six) hours as needed for wheezing or shortness of breath.  Marland Kitchen apixaban (ELIQUIS) 5 MG TABS tablet Take 1 tablet (5 mg total) by mouth 2 (two) times  daily.  . Ascorbic Acid (VITAMIN C) 500 MG CAPS Take 1 capsule by mouth daily.  Marland Kitchen atorvastatin (LIPITOR) 40 MG tablet TAKE 1 TABLET(40 MG) BY MOUTH DAILY AFTER SUPPER (Patient taking differently: Take 40 mg by mouth daily at 6 PM. )  . calcitonin, salmon, (MIACALCIN/FORTICAL) 200 UNIT/ACT nasal spray Place 1 spray into alternate nostrils daily.  . Calcium Carbonate-Vit D-Min (CALCIUM  600+D PLUS MINERALS) 600-400 MG-UNIT TABS Take by mouth daily.  . Cholecalciferol (VITAMIN D) 50 MCG (2000 UT) tablet Take 2,000 Units by mouth daily.  Marland Kitchen diltiazem (CARDIZEM CD) 360 MG 24 hr capsule Take 1 capsule (360 mg total) by mouth daily.  . Fluticasone-Umeclidin-Vilant (TRELEGY ELLIPTA) 100-62.5-25 MCG/INH AEPB Inhale 1 puff into the lungs daily.  . furosemide (LASIX) 40 MG tablet Take 1 tablet (40 mg total) by mouth daily.  Marland Kitchen guaiFENesin (MUCINEX) 600 MG 12 hr tablet Take 600 mg by mouth daily.  Marland Kitchen loratadine (CLARITIN) 10 MG tablet Take 10 mg by mouth daily.  . metoprolol tartrate (LOPRESSOR) 25 MG tablet Take 2 tablets (50 mg total) by mouth 2 (two) times daily.  Marland Kitchen omeprazole (PRILOSEC) 20 MG capsule Take 20 mg by mouth 2 (two) times daily before a meal.  . OXYGEN Inhale 2 L into the lungs continuous. With sleep only  Adapt  . predniSONE (DELTASONE) 20 MG tablet Take 3 pills daily for 1 week, then 2 pills daily for 1 week, then resume normal 20 mg daily dosing. (Patient taking differently: 2 tablets daily)  . tiZANidine (ZANAFLEX) 2 MG tablet Take 1 tablet (2 mg total) by mouth every 6 (six) hours as needed for muscle spasms.             Objective:    amb hoarse wm mild increase wob at rest   01/27/2020         144 10/26/2019        156  07/26/2019          144   06/15/19 136 lb (61.7 kg)  05/29/19 134 lb 7.7 oz (61 kg)  05/28/19 134 lb 3.2 oz (60.9 kg)     Vital signs reviewed  01/27/2020  - Note at rest 02 sats  93% on RA     Reports:   Full dentures  HEENT : pt wearing mask not removed for exam due to covid -19 concerns.    NECK :  without JVD/Nodes/TM/ nl carotid upstrokes bilaterally   LUNGS: no acc muscle use,  Mod barrel  contour chest wall with bilateral  Distant wheeze and  without cough on insp or exp maneuvers and mod  Hyperresonant  to  percussion bilaterally     CV:  RRR  no s3 or murmur or increase in P2, and no edema   ABD:  soft and nontender with pos  mid insp Hoover's  in the supine position. No bruits or organomegaly appreciated, bowel sounds nl  MS:     ext warm without deformities, calf tenderness, cyanosis or clubbing No obvious joint restrictions   SKIN: warm and dry without lesions    NEURO:  alert, approp, nl sensorium with  no motor or cerebellar deficits apparent.              Assessment

## 2020-01-28 ENCOUNTER — Encounter: Payer: Self-pay | Admitting: Internal Medicine

## 2020-01-28 ENCOUNTER — Telehealth: Payer: Self-pay | Admitting: Internal Medicine

## 2020-01-28 DIAGNOSIS — I10 Essential (primary) hypertension: Secondary | ICD-10-CM | POA: Insufficient documentation

## 2020-01-28 DIAGNOSIS — E1159 Type 2 diabetes mellitus with other circulatory complications: Secondary | ICD-10-CM | POA: Insufficient documentation

## 2020-01-28 DIAGNOSIS — J449 Chronic obstructive pulmonary disease, unspecified: Secondary | ICD-10-CM

## 2020-01-28 NOTE — Telephone Encounter (Signed)
Spoke with patient's wife about MW's recommendations. She verbalized understanding. She will call back about the CT scan.

## 2020-01-28 NOTE — Assessment & Plan Note (Addendum)
Change from Lopressor to bisoprolol 01/27/2020 due to concerns of refractory COPD with bronchospasm.   Advised he can start with bisoprolol 5 mg daily and self monitor.  If not adequate he can increase to twice daily dosing.   Medical decision making was a high level of complexity in this case because of multiple  chronic conditions/diagnosis with severe exacerbation and  Potential  side effects of treatment  requiring extra time for  H and P, chart review, counseling,  and generating customized AVS unique to this office visit and charting.   Each maintenance medication was reviewed in detail including emphasizing most importantly the difference between maintenance and prns and under what circumstances the prns are to be triggered using an action plan format where appropriate. Please see avs for details which were reviewed in writing by both me and my nurse and patient given a written copy highlighted where appropriate with yellow highlighter for the patient's continued care at home along with an updated version of their medications.  Patient was asked to maintain medication reconciliation by comparing this list to the actual medications being used at home and to contact this office right away if there is a conflict or discrepancy.

## 2020-01-28 NOTE — Assessment & Plan Note (Addendum)
Quit smoking 01/2009  02/04/12 [name]: FEV1 36% FVC 57% FEF 25 75    11% -05/29/16  FEV1 0.83 L (31%) FEV1/FVC 0.40 FEF 25-75 0.30 L (14%) no bronchodilator response p ? rx prior, DLCO corrected 60% (hemoglobin 10.9) - 03/29/2019   continue symb 160 2bid and spiriva smi 2 puffs each am  - 04/15/2019  After extensive coaching inhaler device,  effectiveness =    90% with both hfa and smi but not understanding contingencies for hfa/ neb  - 04/15/2019 placed on daily pred with ceiling 40 and floor of 10 mg daily and empirical rx for GERD - 04/15/2019 flutter valve training  - alpha one screen 06/15/2019  MM  Level 178 - 06/15/2019  After extensive coaching inhaler device,  effectiveness =    90% > so try to taper pred off using ceiling of 20 if worse > 07/26/2019 changed to ceiling of 10 and floor of 5 mg  - 10/26/2019  After extensive coaching inhaler device,  effectiveness =    90% with elipta and still needing pred 20 mg daily so try trelegy sample > consider breztri if prefers hfa and insurance covers  - 12/27/2019 added ppi bid ac otc for atypical spells of sob  - 01/27/2020 changed lopressor to bisoprolol due to refractory wheeze/ sob on lopressor 100 mg daily    DDX of  difficult airways management almost all start with A and  include Adherence, Ace Inhibitors, Acid Reflux, Active Sinus Disease, Alpha 1 Antitripsin deficiency, Anxiety masquerading as Airways dz,  ABPA,  Allergy(esp in young), Aspiration (esp in elderly), Adverse effects of meds,  Active smoking or vaping, A bunch of PE's (a small clot burden can't cause this syndrome unless there is already severe underlying pulm or vascular dz with poor reserve) plus two Bs  = Bronchiectasis and Beta blocker use..and one C= CHF   Adherence is always the initial "prime suspect" and is a multilayered concern that requires a "trust but verify" approach in every patient - starting with knowing how to use medications, especially inhalers, correctly, keeping up with  refills and understanding the fundamental difference between maintenance and prns vs those medications only taken for a very short course and then stopped and not refilled.  -He returned today for full medication reconciliation and I was able to document that he is taking the medications as prescribed.  I did not realize he was taking so much Lopressor see below.  ? Acid (or non-acid) GERD > always difficult to exclude as up to 75% of pts in some series report no assoc GI/ Heartburn symptoms> rec continue max (24h)  acid suppression and diet restrictions/ reviewed     ? Allergy > seems unlikely given the fact that he does not respond to systemic steroids, at least not in low doses.  For now we will continue Trelegy  But pretreat each am with duoneb to help delivery of dpi to target tissue. - The goal with a chronic steroid dependent illness is always arriving at the lowest effective dose that controls the disease/symptoms and not accepting a set "formula" which is based on statistics or guidelines that don't always take into account patient  variability or the natural hx of the dz in every individual patient, which may well vary over time.  For now therefore I recommend the patient maintain  20 mg ceiling and 10 mg floor   ? Alpha one At def > already ruled out  ? Active sinus dz >  needs sinus CT scan if fails to respond and continues to have intermittently purulent sputum.  ? A bunch of PEs > unlikely while on Eliquis.  ? Bronchiectasis > not apparent by previous CT scans as recently as June 2020.  ? Beta blocker effects > In the setting of respiratory symptoms of unknown etiology,  It would be preferable to use bystolic, the most beta -1  selective Beta blocker available in sample form, with bisoprolol the most selective generic choice  on the market, at least on a trial basis, to make sure the spillover Beta 2 effects of the less specific Beta blockers are not contributing to this patient's  symptoms.  See hbp   ? chf / cardiac asthma > echo reviewed from 07/09/2019 and shows no evidence of either systolic or diastolic dysfunction   Pt informed of the seriousness of COVID 19 infection as a direct risk to lung health  and safey and to close contacts and should continue to wear a facemask in public and minimize exposure to public locations but especially avoid any area or activity where non-close contacts are not observing distancing or wearing an appropriate face mask.  I strongly recommended vaccine when offered.

## 2020-01-28 NOTE — Assessment & Plan Note (Signed)
02 hs since 2013 02/06/12 ONO RA: desat <88% 7 episodes per hour, 66 episodes total 01/27/2013 ONO RA desat < 88% 67min of recording , still needs nocturnal oxygen - 03/25/2019 d/c from admit on 2lpm but not using x at hs  - 04/15/2019 02 sats 94% at rest despite flare in copd so rec 2lpm  Hs and prn activity  to keep sats > 90% at all times   As of 01/27/2020   2lpm hs and prn daytime > goal is to keep sats > 90% esp with exertion

## 2020-01-28 NOTE — Telephone Encounter (Signed)
Pt made aware of the following:  I am fine with him stopping the metoprolol at the recommendation of pulmonary.  Lets see how his lung condition responds. Continue to monitor heart rate. Candee Furbish, MD  He verbalized understanding.

## 2020-01-28 NOTE — Telephone Encounter (Signed)
I am fine with him stopping the metoprolol at the recommendation of pulmonary.  Lets see how his lung condition responds. Continue to monitor heart rate. Candee Furbish, MD

## 2020-01-28 NOTE — Telephone Encounter (Signed)
Nothing else to recommend for now other that what's on the avs  I would like though to arrange for him to get a sinus CT and in meantime will need to go to ER if can't get comfortable at rest or saturate if following instructions already given

## 2020-01-28 NOTE — Telephone Encounter (Signed)
Spoke with pt's spouse, states that pt is worse today than he was in clinic yesterday.  O2 sats in the mid 80's%.  Pt is not wearing supplemental O2; I advised wife to put pt on his oxygen as this was outlined in yesterday's OV note.  Pt has increased prednisone to 30mg  today, has been using albuterol neb and Trelegy.   Wife is requesting additional recs   Pharmacy: [facility] in [place]  MW please advise on additional recs.  Thanks!

## 2020-01-30 ENCOUNTER — Encounter (HOSPITAL_BASED_OUTPATIENT_CLINIC_OR_DEPARTMENT_OTHER): Payer: Self-pay | Admitting: *Deleted

## 2020-01-30 ENCOUNTER — Other Ambulatory Visit: Payer: Self-pay

## 2020-01-30 ENCOUNTER — Emergency Department (HOSPITAL_BASED_OUTPATIENT_CLINIC_OR_DEPARTMENT_OTHER)
Admission: EM | Admit: 2020-01-30 | Discharge: 2020-01-30 | Disposition: A | Discharge status: Home / Self Care | Payer: Medicare Other | Source: Home / Self Care | Attending: Emergency Medicine | Admitting: Emergency Medicine

## 2020-01-30 DIAGNOSIS — I10 Essential (primary) hypertension: Secondary | ICD-10-CM | POA: Insufficient documentation

## 2020-01-30 DIAGNOSIS — J441 Chronic obstructive pulmonary disease with (acute) exacerbation: Secondary | ICD-10-CM

## 2020-01-30 DIAGNOSIS — E782 Mixed hyperlipidemia: Secondary | ICD-10-CM | POA: Insufficient documentation

## 2020-01-30 DIAGNOSIS — Z87891 Personal history of nicotine dependence: Secondary | ICD-10-CM | POA: Insufficient documentation

## 2020-01-30 DIAGNOSIS — Z7952 Long term (current) use of systemic steroids: Secondary | ICD-10-CM | POA: Insufficient documentation

## 2020-01-30 DIAGNOSIS — I2510 Atherosclerotic heart disease of native coronary artery without angina pectoris: Secondary | ICD-10-CM | POA: Insufficient documentation

## 2020-01-30 DIAGNOSIS — Z79899 Other long term (current) drug therapy: Secondary | ICD-10-CM | POA: Insufficient documentation

## 2020-01-30 DIAGNOSIS — I4891 Unspecified atrial fibrillation: Secondary | ICD-10-CM | POA: Insufficient documentation

## 2020-01-30 DIAGNOSIS — E119 Type 2 diabetes mellitus without complications: Secondary | ICD-10-CM | POA: Insufficient documentation

## 2020-01-30 DIAGNOSIS — Z7901 Long term (current) use of anticoagulants: Secondary | ICD-10-CM | POA: Insufficient documentation

## 2020-01-30 DIAGNOSIS — R000 Tachycardia, unspecified: Secondary | ICD-10-CM | POA: Diagnosis not present

## 2020-01-30 MED ORDER — IPRATROPIUM BROMIDE HFA 17 MCG/ACT IN AERS
2.0000 | INHALATION_SPRAY | Freq: Once | RESPIRATORY_TRACT | Status: AC
  Administered 2020-01-30: 2 via RESPIRATORY_TRACT
  Filled 2020-01-30: qty 12.9

## 2020-01-30 MED ORDER — METHYLPREDNISOLONE SODIUM SUCC 125 MG IJ SOLR
125.0000 mg | Freq: Once | INTRAMUSCULAR | Status: AC
  Administered 2020-01-30: 125 mg via INTRAVENOUS
  Filled 2020-01-30: qty 2

## 2020-01-30 MED ORDER — AZITHROMYCIN 250 MG PO TABS: ORAL_TABLET | ORAL | 0 refills | Status: DC

## 2020-01-30 MED ORDER — ALBUTEROL SULFATE HFA 108 (90 BASE) MCG/ACT IN AERS
8.0000 | INHALATION_SPRAY | Freq: Once | RESPIRATORY_TRACT | Status: AC
  Administered 2020-01-30: 8 via RESPIRATORY_TRACT
  Filled 2020-01-30: qty 6.7

## 2020-01-30 MED ORDER — ALBUTEROL SULFATE HFA 108 (90 BASE) MCG/ACT IN AERS
6.0000 | INHALATION_SPRAY | Freq: Once | RESPIRATORY_TRACT | Status: AC
  Administered 2020-01-30: 6 via RESPIRATORY_TRACT

## 2020-01-30 MED ORDER — IPRATROPIUM BROMIDE HFA 17 MCG/ACT IN AERS
1.0000 | INHALATION_SPRAY | Freq: Once | RESPIRATORY_TRACT | Status: AC
  Administered 2020-01-30: 1 via RESPIRATORY_TRACT

## 2020-01-30 NOTE — ED Triage Notes (Addendum)
Pt presents with increased sob that started at 2300 last night. History of COPD.  Pt wears home oxygen at 2l n/c. Pt presents in resp distress with 02 sats 88%. Pt states prod cough, green in color. HR 106. States he doesn't feel like the new inhaler Trelegy "is  helping"

## 2020-01-30 NOTE — ED Notes (Signed)
MD at bedside. 

## 2020-01-30 NOTE — Progress Notes (Signed)
Patient ambulated around his room while on pulse oximetry and 2 liter nasal cannula.  Patient's SPO2 remained between 94% and 100%.  Patient wears 2 liter nasal cannula at home.

## 2020-01-30 NOTE — ED Provider Notes (Signed)
[facility] Provider Note  CSN: [id number] Arrival date & time: 01/30/20 0202  Chief Complaint(s) Shortness of Breath  HPI Paul [name] is a 74 y.o. male with a history of COPD on 2 L nasal cannula who was recently switched from Symbicort to Trelegy and currently on oral steroid taper presents to the emergency department with shortness of breath typical of a COPD exacerbation.  Patient reports that for the past week or so he has been having worsening dyspnea on exertion.  Tonight prior to going to bed he began having shortness of breath that rapidly worsened.  Attempted to use albuterol inhaler which did not provide relief. Reports yellowish/green sputum. No recent fevers or infections.  No chest pain.  No peripheral edema.  No other physical complaints.  HPI  Past Medical History Past Medical History:  Diagnosis Date  . A-fib ([place])   . COPD (chronic obstructive pulmonary disease) ([place])   . High cholesterol    Patient Active Problem List   Diagnosis Date Noted  . Essential hypertension 01/28/2020  . Encounter for general adult medical examination with abnormal findings 01/25/2020  . Chronic bilateral thoracic back pain 01/25/2020  . 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 SOB (shortness of breath)   . HLD (hyperlipidemia) 01/05/2020  . Vertebral compression fracture ([place]) 05/29/2019  . Chronic anemia 05/29/2019  . Pulmonary nodules 05/29/2019  . Viral pneumonitis 05/28/2019  . COPD with acute exacerbation ([place]) 05/28/2019  . Chronic anticoagulation 04/06/2019  . Coronary artery calcification 04/06/2019  . Atherosclerosis of aorta ([place]) 04/06/2019  . Paroxysmal atrial fibrillation ([facility])   . Acute on chronic respiratory failure with hypoxia ([place])   . COPD exacerbation ([place]) 03/19/2019  . Lung nodule < 6cm on CT 05/29/2016  . Former cigarette smoker 12/05/2015  . Chronic respiratory failure with hypoxia ([place]) 01/27/2013  .  COPD GOLD  III     Home Medication(s) Prior to Admission medications   Medication Sig Start Date End Date Taking? Authorizing Provider  albuterol (PROAIR HFA) 108 (90 Base) MCG/ACT inhaler Inhale 2 puffs into the lungs every 6 (six) hours as needed for wheezing or shortness of breath.    [provider]  albuterol (PROVENTIL) (2.5 MG/3ML) 0.083% nebulizer solution Take 3 mLs (2.5 mg total) by nebulization every 6 (six) hours as needed for wheezing or shortness of breath. 02/28/19   [name], PA-C  apixaban (ELIQUIS) 5 MG TABS tablet Take 1 tablet (5 mg total) by mouth 2 (two) times daily. 12/27/19   Jerline Pain, MD  Ascorbic Acid (VITAMIN C) 500 MG CAPS Take 1 capsule by mouth daily.    [provider]  atorvastatin (LIPITOR) 40 MG tablet TAKE 1 TABLET(40 MG) BY MOUTH DAILY AFTER SUPPER Patient taking differently: Take 40 mg by mouth daily at 6 PM.  12/27/19   Jerline Pain, MD  azithromycin (ZITHROMAX Z-PAK) 250 MG tablet Take 500 mg on day 1, then 250 mg once a day for days 2, 3, 4, and 5. 01/30/20   Cardama, Grayce Sessions, MD  bisoprolol (ZEBETA) 5 MG tablet One daily 01/27/20   Tanda Rockers, MD  calcitonin, salmon, (MIACALCIN/FORTICAL) 200 UNIT/ACT nasal spray Place 1 spray into alternate nostrils daily. 01/11/20   Hoyt Koch, MD  Calcium Carbonate-Vit D-Min (CALCIUM 600+D PLUS MINERALS) 600-400 MG-UNIT TABS Take by mouth daily.    [provider]  Cholecalciferol (VITAMIN D) 50 MCG (2000 UT) tablet Take 2,000  Units by mouth daily.    [provider]  diltiazem (CARDIZEM CD) 360 MG 24 hr capsule Take 1 capsule (360 mg total) by mouth daily. 09/24/19   Jerline Pain, MD  Fluticasone-Umeclidin-Vilant (TRELEGY ELLIPTA) 100-62.5-25 MCG/INH AEPB Inhale 1 puff into the lungs daily. 10/26/19   Tanda Rockers, MD  Fluticasone-Umeclidin-Vilant (TRELEGY ELLIPTA) 100-62.5-25 MCG/INH AEPB Inhale 1 puff into the lungs daily. 01/27/20   Tanda Rockers, MD   furosemide (LASIX) 40 MG tablet Take 1 tablet (40 mg total) by mouth daily. 12/27/19 03/26/20  Jerline Pain, MD  guaiFENesin (MUCINEX) 600 MG 12 hr tablet Take 2 tablets (1,200 mg total) by mouth 2 (two) times daily as needed. 01/27/20   Tanda Rockers, MD  loratadine (CLARITIN) 10 MG tablet Take 10 mg by mouth daily.    [provider]  omeprazole (PRILOSEC) 20 MG capsule Take 20 mg by mouth 2 (two) times daily before a meal.    [provider]  OXYGEN Inhale 2 L into the lungs continuous. With sleep only  Adapt    [provider]  predniSONE (DELTASONE) 20 MG tablet One daily ok to double dose if breathing worse 01/27/20   Tanda Rockers, MD  tiZANidine (ZANAFLEX) 2 MG tablet Take 1 tablet (2 mg total) by mouth every 6 (six) hours as needed for muscle spasms. 01/25/20   [name], MD                                                                                                                                    Past Surgical History Past Surgical History:  Procedure Laterality Date  . CATARACT EXTRACTION Right   . SKIN SURGERY     shoulders and chest   Family History Family History  Problem Relation Age of Onset  . Heart disease Brother   . Heart disease Mother   . Heart disease Father   . Cancer Paternal Uncle     Social History Social History   Tobacco Use  . Smoking status: Former Smoker    Packs/day: 2.00    Years: 52.00    Pack years: 104.00    Types: Cigarettes    Quit date: 02/03/2009    Years since quitting: 10.9  . Smokeless tobacco: Never Used  . Tobacco comment: Counseled to remain smoke free  Substance Use Topics  . Alcohol use: Yes    Alcohol/week: 0.0 standard drinks    Comment: occ  . Drug use: No   Allergies Patient has no known allergies.  Review of Systems Review of Systems All other systems are reviewed and are negative for acute change except as noted in the HPI  Physical Exam Vital Signs  I have reviewed the triage  vital signs BP 139/69   Pulse 97   Temp 98 F (36.7 C) (Oral)   Resp 17   SpO2 98%   Physical Exam  Vitals reviewed.  Constitutional:      General: He is not in acute distress.    Appearance: He is well-developed. He is not diaphoretic.  HENT:     Head: Normocephalic and atraumatic.     Nose: Nose normal.  Eyes:     General: No scleral icterus.       Right eye: No discharge.        Left eye: No discharge.     Conjunctiva/sclera: Conjunctivae normal.     Pupils: Pupils are equal, round, and reactive to light.  Cardiovascular:     Rate and Rhythm: Normal rate and regular rhythm.     Heart sounds: No murmur. No friction rub. No gallop.   Pulmonary:     Effort: Tachypnea, accessory muscle usage, prolonged expiration and respiratory distress present.     Breath sounds: Decreased air movement present. No stridor. No rales.  Abdominal:     General: There is no distension.     Palpations: Abdomen is soft.     Tenderness: There is no abdominal tenderness.  Musculoskeletal:        General: No tenderness.     Cervical back: Normal range of motion and neck supple.  Skin:    General: Skin is warm and dry.     Findings: No erythema or rash.  Neurological:     Mental Status: He is alert and oriented to person, place, and time.     ED Results and Treatments Labs (all labs ordered are listed, but only abnormal results are displayed) Labs Reviewed - No data to display                                                                                                                       EKG  EKG Interpretation  Date/Time:  Sunday January 30 2020 02:15:29 EST Ventricular Rate:  99 PR Interval:    QRS Duration: 90 QT Interval:  352 QTC Calculation: [id number] R Axis:   71 Text Interpretation: Sinus tachycardia Borderline low voltage, extremity leads No significant change since last tracing Confirmed by Addison Lank [phone]) on 01/30/2020 3:31:01 AM      Radiology No results found.   Pertinent labs & imaging results that were available during my care of the patient were reviewed by me and considered in my medical decision making (see chart for details).  Medications Ordered in ED Medications  methylPREDNISolone sodium succinate (SOLU-MEDROL) 125 mg/2 mL injection 125 mg (125 mg Intravenous Given 01/30/20 0232)  albuterol (VENTOLIN HFA) 108 (90 Base) MCG/ACT inhaler 8 puff (8 puffs Inhalation Given 01/30/20 0237)  ipratropium (ATROVENT HFA) inhaler 2 puff (2 puffs Inhalation Given 01/30/20 0237)  ipratropium (ATROVENT HFA) inhaler 1 puff (1 puff Inhalation Given 01/30/20 0329)  albuterol (VENTOLIN HFA) 108 (90 Base) MCG/ACT inhaler 6 puff (6 puffs Inhalation Given 01/30/20 0329)  Procedures .Critical Care Performed by: Fatima Blank, MD Authorized by: Fatima Blank, MD    CRITICAL CARE Performed by: Grayce Sessions Cardama Total critical care time: 40 minutes Critical care time was exclusive of separately billable procedures and treating other patients. Critical care was necessary to treat or prevent imminent or life-threatening deterioration. Critical care was time spent personally by me on the following activities: development of treatment plan with patient and/or surrogate as well as nursing, discussions with consultants, evaluation of patient's response to treatment, examination of patient, obtaining history from patient or surrogate, ordering and performing treatments and interventions, ordering and review of laboratory studies, ordering and review of radiographic studies, pulse oximetry and re-evaluation of patient's condition.    (including critical care time)  Medical Decision Making / ED Course I have reviewed the nursing notes for this encounter and the patient's prior records (if available in EHR or on provided paperwork).    Paul [name] was evaluated in Emergency Department on 01/30/2020 for the symptoms described in the history of present illness. He was evaluated in the context of the global COVID-19 pandemic, which necessitated consideration that the patient might be at risk for infection with the SARS-CoV-2 virus that causes COVID-19. Institutional protocols and algorithms that pertain to the evaluation of patients at risk for COVID-19 are in a state of rapid change based on information released by regulatory bodies including the CDC and federal and state organizations. These policies and algorithms were followed during the patient's care in the ED.  2:32 AM  Hypoxic respiratory distress secondary to COPD exacerbation.  Provided with high-dose albuterol and Atrovent.  IV Solu-Medrol given.  3:22 AM On reassessment, WOB significantly improved. Improved air movement. Now with faint exp wheezing. Will repeat breathing treatment and reassess.  5:14 AM Clear lungs with good air movement. Ambulated with desating.       Final Clinical Impression(s) / ED Diagnoses Final diagnoses:  COPD exacerbation ([place])   The patient appears reasonably screened and/or stabilized for discharge and I doubt any other medical condition or other [facility] requiring further screening, evaluation, or treatment in the ED at this time prior to discharge. Safe for discharge with strict return precautions.  Disposition: Discharge  Condition: Good  I have discussed the results, Dx and Tx plan with the patient/family who expressed understanding and agree(s) with the plan. Discharge instructions discussed at length. The patient/family was given strict return precautions who verbalized understanding of the instructions. No further questions at time of discharge.    ED Discharge Orders         Ordered    azithromycin (ZITHROMAX Z-PAK) 250 MG tablet     01/30/20 [id number]           Follow Up: Pulmonologist  Schedule an appointment as  soon as possible for a visit      This chart was dictated using voice recognition software.  Despite best efforts to proofread,  errors can occur which can change the documentation meaning.   Fatima Blank, MD 01/30/20 [phone]

## 2020-01-30 NOTE — Discharge Instructions (Addendum)
Take 4 puffs of your albuterol rescue inhaler every 4 hours for 1 day.  After that you may use 2 to 4 puffs every 4-6 hours as needed for shortness of breath.  Increase your prednisone to 40 mg daily for the next 4 days. Then continue tapering as previously directed.

## 2020-01-31 ENCOUNTER — Telehealth: Payer: Self-pay | Admitting: Internal Medicine

## 2020-01-31 ENCOUNTER — Encounter: Payer: Self-pay | Admitting: Internal Medicine

## 2020-01-31 ENCOUNTER — Telehealth: Payer: Self-pay

## 2020-01-31 DIAGNOSIS — R053 Chronic cough: Secondary | ICD-10-CM | POA: Insufficient documentation

## 2020-01-31 DIAGNOSIS — R05 Cough: Secondary | ICD-10-CM | POA: Insufficient documentation

## 2020-01-31 DIAGNOSIS — R059 Cough, unspecified: Secondary | ICD-10-CM

## 2020-01-31 NOTE — Telephone Encounter (Signed)
Chronic cough, placed on problem list

## 2020-01-31 NOTE — Telephone Encounter (Signed)
**Note De-Identified Jakai Onofre Obfuscation** Letter received from [employer] stating that they have approved the pt for asst with his Eliquis. Approval good until [id number] Application Case#: [id number]  The letter states that they have notified the pt of this approval as well.

## 2020-01-31 NOTE — Telephone Encounter (Signed)
Spoke with the pt's spouse, [name]  She states that the pt had to go to ED on 01/30/20 with increased SOB and low o2 sats  She states that they did not admit him  They advised him to stop the trelegy and get back on his symbicort and spiriva  They also increased pred to 40 mg for 4 days  Pt is feeling better now, back to baseline  Will forward to MW to make him aware His next f/u is on 02/24/20

## 2020-01-31 NOTE — Telephone Encounter (Signed)
CT was ordered  Pt aware

## 2020-01-31 NOTE — Telephone Encounter (Signed)
Fine with me

## 2020-01-31 NOTE — Telephone Encounter (Signed)
Spoke with the pt's spouse  Pt ok with sinus CT  Please advise what dx to use and I will order, thanks

## 2020-01-31 NOTE — Telephone Encounter (Signed)
Also need to go ahead with sinus CT this week to sort out why doing so poorly with persistent discolored sputum

## 2020-02-01 ENCOUNTER — Other Ambulatory Visit: Payer: Self-pay | Admitting: Family Medicine

## 2020-02-01 ENCOUNTER — Other Ambulatory Visit: Payer: Self-pay

## 2020-02-01 MED ORDER — ESCITALOPRAM OXALATE 5 MG PO TABS: 5.0000 mg | ORAL_TABLET | Freq: Every day | ORAL | 0 refills | Status: DC

## 2020-02-01 MED ORDER — LORAZEPAM 0.5 MG PO TABS: 0.5000 mg | ORAL_TABLET | Freq: Every day | ORAL | 1 refills | Status: DC | PRN

## 2020-02-02 ENCOUNTER — Emergency Department (HOSPITAL_BASED_OUTPATIENT_CLINIC_OR_DEPARTMENT_OTHER): Payer: Medicare Other

## 2020-02-02 ENCOUNTER — Encounter (HOSPITAL_BASED_OUTPATIENT_CLINIC_OR_DEPARTMENT_OTHER): Payer: Self-pay | Admitting: Emergency Medicine

## 2020-02-02 ENCOUNTER — Telehealth: Payer: Self-pay | Admitting: Internal Medicine

## 2020-02-02 ENCOUNTER — Inpatient Hospital Stay (HOSPITAL_BASED_OUTPATIENT_CLINIC_OR_DEPARTMENT_OTHER)
Admission: EM | Admit: 2020-02-02 | Discharge: 2020-02-07 | DRG: 190 | Disposition: A | Discharge status: Home / Self Care | Payer: Medicare Other | Attending: Internal Medicine | Admitting: Internal Medicine

## 2020-02-02 ENCOUNTER — Other Ambulatory Visit: Payer: Self-pay

## 2020-02-02 DIAGNOSIS — R0602 Shortness of breath: Secondary | ICD-10-CM | POA: Diagnosis not present

## 2020-02-02 DIAGNOSIS — J9621 Acute and chronic respiratory failure with hypoxia: Secondary | ICD-10-CM | POA: Diagnosis present

## 2020-02-02 DIAGNOSIS — E1136 Type 2 diabetes mellitus with diabetic cataract: Secondary | ICD-10-CM | POA: Diagnosis present

## 2020-02-02 DIAGNOSIS — E782 Mixed hyperlipidemia: Secondary | ICD-10-CM | POA: Diagnosis present

## 2020-02-02 DIAGNOSIS — Z8249 Family history of ischemic heart disease and other diseases of the circulatory system: Secondary | ICD-10-CM | POA: Diagnosis not present

## 2020-02-02 DIAGNOSIS — G4700 Insomnia, unspecified: Secondary | ICD-10-CM | POA: Diagnosis present

## 2020-02-02 DIAGNOSIS — Z79899 Other long term (current) drug therapy: Secondary | ICD-10-CM | POA: Diagnosis not present

## 2020-02-02 DIAGNOSIS — I10 Essential (primary) hypertension: Secondary | ICD-10-CM | POA: Diagnosis present

## 2020-02-02 DIAGNOSIS — E1169 Type 2 diabetes mellitus with other specified complication: Secondary | ICD-10-CM | POA: Diagnosis present

## 2020-02-02 DIAGNOSIS — I482 Chronic atrial fibrillation, unspecified: Secondary | ICD-10-CM | POA: Diagnosis present

## 2020-02-02 DIAGNOSIS — Z87891 Personal history of nicotine dependence: Secondary | ICD-10-CM | POA: Diagnosis not present

## 2020-02-02 DIAGNOSIS — Z20822 Contact with and (suspected) exposure to covid-19: Secondary | ICD-10-CM | POA: Diagnosis not present

## 2020-02-02 DIAGNOSIS — Z7952 Long term (current) use of systemic steroids: Secondary | ICD-10-CM

## 2020-02-02 DIAGNOSIS — Z77090 Contact with and (suspected) exposure to asbestos: Secondary | ICD-10-CM | POA: Diagnosis present

## 2020-02-02 DIAGNOSIS — J441 Chronic obstructive pulmonary disease with (acute) exacerbation: Secondary | ICD-10-CM | POA: Diagnosis not present

## 2020-02-02 DIAGNOSIS — Z9981 Dependence on supplemental oxygen: Secondary | ICD-10-CM

## 2020-02-02 DIAGNOSIS — I2510 Atherosclerotic heart disease of native coronary artery without angina pectoris: Secondary | ICD-10-CM | POA: Diagnosis present

## 2020-02-02 DIAGNOSIS — M62838 Other muscle spasm: Secondary | ICD-10-CM | POA: Diagnosis present

## 2020-02-02 DIAGNOSIS — Z7901 Long term (current) use of anticoagulants: Secondary | ICD-10-CM

## 2020-02-02 DIAGNOSIS — R05 Cough: Secondary | ICD-10-CM | POA: Diagnosis not present

## 2020-02-02 DIAGNOSIS — I700 Atherosclerosis of aorta: Secondary | ICD-10-CM | POA: Diagnosis present

## 2020-02-02 DIAGNOSIS — J3489 Other specified disorders of nose and nasal sinuses: Secondary | ICD-10-CM | POA: Diagnosis not present

## 2020-02-02 DIAGNOSIS — J439 Emphysema, unspecified: Secondary | ICD-10-CM | POA: Diagnosis not present

## 2020-02-02 LAB — SARS CORONAVIRUS 2 BY RT PCR (HOSPITAL ORDER, PERFORMED IN ~~LOC~~ HOSPITAL LAB): SARS Coronavirus 2: NEGATIVE

## 2020-02-02 LAB — CBC WITH DIFFERENTIAL/PLATELET
Abs Immature Granulocytes: 0.09 10*3/uL — ABNORMAL HIGH (ref 0.00–0.07)
Basophils Absolute: 0 10*3/uL (ref 0.0–0.1)
Basophils Relative: 0 %
Eosinophils Absolute: 0 10*3/uL (ref 0.0–0.5)
Eosinophils Relative: 0 %
HCT: 43.1 % (ref 39.0–52.0)
Hemoglobin: 13.3 g/dL (ref 13.0–17.0)
Immature Granulocytes: 1 %
Lymphocytes Relative: 6 %
Lymphs Abs: 1.1 10*3/uL (ref 0.7–4.0)
MCH: 25.6 pg — ABNORMAL LOW (ref 26.0–34.0)
MCHC: 30.9 g/dL (ref 30.0–36.0)
MCV: 83 fL (ref 80.0–100.0)
Monocytes Absolute: 0.7 10*3/uL (ref 0.1–1.0)
Monocytes Relative: 4 %
Neutro Abs: 14.7 10*3/uL — ABNORMAL HIGH (ref 1.7–7.7)
Neutrophils Relative %: 89 %
Platelets: 373 10*3/uL (ref 150–400)
RBC: 5.19 MIL/uL (ref 4.22–5.81)
RDW: 15.1 % (ref 11.5–15.5)
WBC: 16.6 10*3/uL — ABNORMAL HIGH (ref 4.0–10.5)
nRBC: 0 % (ref 0.0–0.2)

## 2020-02-02 LAB — BASIC METABOLIC PANEL
Anion gap: 11 (ref 5–15)
BUN: 13 mg/dL (ref 8–23)
CO2: 29 mmol/L (ref 22–32)
Calcium: 8.8 mg/dL — ABNORMAL LOW (ref 8.9–10.3)
Chloride: 96 mmol/L — ABNORMAL LOW (ref 98–111)
Creatinine, Ser: 0.66 mg/dL (ref 0.61–1.24)
GFR calc Af Amer: >60 mL/min (ref >60)
GFR calc non Af Amer: >60 mL/min (ref >60)
Glucose, Bld: 199 mg/dL — ABNORMAL HIGH (ref 70–99)
Potassium: 3.5 mmol/L (ref 3.5–5.1)
Sodium: 136 mmol/L (ref 135–145)

## 2020-02-02 LAB — TROPONIN I (HIGH SENSITIVITY): Troponin I (High Sensitivity): 4 ng/L (ref <18)

## 2020-02-02 LAB — BRAIN NATRIURETIC PEPTIDE: B Natriuretic Peptide: 87.4 pg/mL (ref 0.0–100.0)

## 2020-02-02 MED ORDER — LORATADINE 10 MG PO TABS
10.0000 mg | ORAL_TABLET | Freq: Every day | ORAL | Status: DC
  Administered 2020-02-03 – 2020-02-07 (×5): 10 mg via ORAL
  Filled 2020-02-02 (×6): qty 1

## 2020-02-02 MED ORDER — TIZANIDINE HCL 4 MG PO TABS: 2.0000 mg | ORAL_TABLET | Freq: Four times a day (QID) | ORAL | Status: DC | PRN

## 2020-02-02 MED ORDER — LORAZEPAM 0.5 MG PO TABS: 0.5000 mg | ORAL_TABLET | Freq: Every day | ORAL | Status: DC | PRN

## 2020-02-02 MED ORDER — METHYLPREDNISOLONE SODIUM SUCC 125 MG IJ SOLR
60.0000 mg | Freq: Four times a day (QID) | INTRAMUSCULAR | Status: DC
  Administered 2020-02-02 – 2020-02-06 (×15): 60 mg via INTRAVENOUS
  Filled 2020-02-02 (×16): qty 2

## 2020-02-02 MED ORDER — TIOTROPIUM BROMIDE MONOHYDRATE 2.5 MCG/ACT IN AERS: 2.5000 ug | INHALATION_SPRAY | Freq: Every day | RESPIRATORY_TRACT | Status: DC

## 2020-02-02 MED ORDER — MAGNESIUM SULFATE 2 GM/50ML IV SOLN
2.0000 g | Freq: Once | INTRAVENOUS | Status: AC
  Administered 2020-02-02: 2 g via INTRAVENOUS
  Filled 2020-02-02: qty 50

## 2020-02-02 MED ORDER — TEMAZEPAM 15 MG PO CAPS
15.0000 mg | ORAL_CAPSULE | Freq: Every evening | ORAL | Status: DC | PRN
  Administered 2020-02-02 – 2020-02-06 (×5): 15 mg via ORAL
  Filled 2020-02-02 (×5): qty 1

## 2020-02-02 MED ORDER — ALBUTEROL SULFATE (2.5 MG/3ML) 0.083% IN NEBU
2.5000 mg | INHALATION_SOLUTION | Freq: Four times a day (QID) | RESPIRATORY_TRACT | Status: DC
  Administered 2020-02-02 – 2020-02-04 (×6): 2.5 mg via RESPIRATORY_TRACT
  Filled 2020-02-02 (×6): qty 3

## 2020-02-02 MED ORDER — GUAIFENESIN ER 600 MG PO TB12
1200.0000 mg | ORAL_TABLET | Freq: Two times a day (BID) | ORAL | Status: DC
  Administered 2020-02-02 – 2020-02-07 (×10): 1200 mg via ORAL
  Filled 2020-02-02 (×11): qty 2

## 2020-02-02 MED ORDER — METHYLPREDNISOLONE SODIUM SUCC 125 MG IJ SOLR
125.0000 mg | Freq: Once | INTRAMUSCULAR | Status: AC
  Administered 2020-02-02: 125 mg via INTRAVENOUS
  Filled 2020-02-02: qty 2

## 2020-02-02 MED ORDER — IPRATROPIUM BROMIDE HFA 17 MCG/ACT IN AERS
2.0000 | INHALATION_SPRAY | Freq: Once | RESPIRATORY_TRACT | Status: AC
  Administered 2020-02-02: 2 via RESPIRATORY_TRACT
  Filled 2020-02-02: qty 12.9

## 2020-02-02 MED ORDER — UMECLIDINIUM BROMIDE 62.5 MCG/INH IN AEPB
1.0000 | INHALATION_SPRAY | Freq: Every day | RESPIRATORY_TRACT | Status: DC
  Administered 2020-02-03 – 2020-02-07 (×5): 1 via RESPIRATORY_TRACT
  Filled 2020-02-02: qty 7

## 2020-02-02 MED ORDER — ESCITALOPRAM OXALATE 10 MG PO TABS
5.0000 mg | ORAL_TABLET | Freq: Every day | ORAL | Status: DC
  Administered 2020-02-03 – 2020-02-07 (×5): 5 mg via ORAL
  Filled 2020-02-02 (×6): qty 1

## 2020-02-02 MED ORDER — ALBUTEROL SULFATE (2.5 MG/3ML) 0.083% IN NEBU
2.5000 mg | INHALATION_SOLUTION | Freq: Once | RESPIRATORY_TRACT | Status: AC
  Administered 2020-02-02: 2.5 mg via RESPIRATORY_TRACT
  Filled 2020-02-02: qty 3

## 2020-02-02 MED ORDER — APIXABAN 5 MG PO TABS
5.0000 mg | ORAL_TABLET | Freq: Two times a day (BID) | ORAL | Status: DC
  Administered 2020-02-02 – 2020-02-07 (×10): 5 mg via ORAL
  Filled 2020-02-02 (×10): qty 1

## 2020-02-02 MED ORDER — DILTIAZEM HCL ER COATED BEADS 180 MG PO CP24
360.0000 mg | ORAL_CAPSULE | Freq: Every day | ORAL | Status: DC
  Administered 2020-02-03 – 2020-02-07 (×5): 360 mg via ORAL
  Filled 2020-02-02 (×6): qty 2

## 2020-02-02 MED ORDER — ATORVASTATIN CALCIUM 40 MG PO TABS
40.0000 mg | ORAL_TABLET | Freq: Every day | ORAL | Status: DC
  Administered 2020-02-02 – 2020-02-06 (×5): 40 mg via ORAL
  Filled 2020-02-02 (×5): qty 1

## 2020-02-02 MED ORDER — IPRATROPIUM-ALBUTEROL 0.5-2.5 (3) MG/3ML IN SOLN
3.0000 mL | Freq: Once | RESPIRATORY_TRACT | Status: AC
  Administered 2020-02-02: 3 mL via RESPIRATORY_TRACT
  Filled 2020-02-02: qty 3

## 2020-02-02 MED ORDER — ALBUTEROL SULFATE HFA 108 (90 BASE) MCG/ACT IN AERS
8.0000 | INHALATION_SPRAY | Freq: Once | RESPIRATORY_TRACT | Status: AC
  Administered 2020-02-02: 8 via RESPIRATORY_TRACT
  Filled 2020-02-02: qty 6.7

## 2020-02-02 MED ORDER — BISOPROLOL FUMARATE 5 MG PO TABS
5.0000 mg | ORAL_TABLET | Freq: Every day | ORAL | Status: DC
  Administered 2020-02-03 – 2020-02-07 (×5): 5 mg via ORAL
  Filled 2020-02-02 (×6): qty 1

## 2020-02-02 NOTE — ED Notes (Signed)
Patient is resting comfortably. 

## 2020-02-02 NOTE — ED Triage Notes (Signed)
Pt was here on Sunday with sob.  Pt was discharged home but he feels like he still cannot breath.  Pt on home O2 at 2L/min per [place].  Pt states he usually on wears at night but lately it has been continuous.  Pt has labored breathing.  POX at registration was 95% on 2L.  Once pt brought to room and took off shirt, pt off O2 briefly and sat dropped to 87%.  Pt placed on 3L at that time.

## 2020-02-02 NOTE — H&P (Signed)
History and Physical    Paul [name] [id number] DOB: [birth year] DOA: 02/02/2020  PCP: [name], MD Patient coming from:home Chief Complaint: Cough and shortness of breath for the last 3 days  HPI: Paul [name] is a 74 y.o. 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  He was started on azithromycin as an outpatient.  He reported his phlegm was green then turned yellow.  He also complains of pleuritic chest pain.  No nausea vomiting palpitations diarrhea loss of consciousness.  No urinary complaints.  No leg swelling.  No fever or Covid exposure.  He lives with his wife at home who is well.      ED Course: Patient was given Solu-Medrol and nebulizer. Review of Systems: As per HPI otherwise all other systems reviewed and are negative Sodium 136 potassium 3.5 BUN 13 creatinine 0.66 troponin was 4 BNP was 87 white count was 16.6 hemoglobin 13.3 platelet count 373. Covid negative. Blood cultures were drawn.  Chest x-ray no active disease.  Ambulatory Status: He ambulates at home without a walker  Past Medical History:  Diagnosis Date  . A-fib ([place])   . COPD (chronic obstructive pulmonary disease) ([place])   . High cholesterol     Past Surgical History:  Procedure Laterality Date  . CATARACT EXTRACTION Right   . SKIN SURGERY     shoulders and chest    Social History   Socioeconomic History  . Marital status: Married    Spouse name: Not on file  . Number of children: 3  . Years of education: Not on file  . Highest education level: Not on file  Occupational History  . Occupation: retired    Comment: truck Geophysicist/field seismologist  Tobacco Use  . Smoking status: Former Smoker    Packs/day: 2.00    Years: 52.00    Pack years: 104.00    Types: Cigarettes    Quit date: 02/03/2009     Years since quitting: 11.0  . Smokeless tobacco: Never Used  . Tobacco comment: Counseled to remain smoke free  Substance and Sexual Activity  . Alcohol use: Yes    Alcohol/week: 0.0 standard drinks    Comment: occ  . Drug use: No  . Sexual activity: Not on file  Other Topics Concern  . Not on file  Social History Narrative   Originally from [place]. Previously worked driving a Restaurant manager, fast food. No pets currently. No mold exposure. 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    Social Determinants of Health   Financial Resource Strain:   . Difficulty of Paying Living Expenses: Not on file  Food Insecurity:   . Worried About Charity fundraiser in the Last Year: Not on file  . Ran Out of Food in the Last Year: Not on file  Transportation Needs:   . Lack of Transportation (Medical): Not on file  . Lack of Transportation (Non-Medical): Not on file  Physical Activity: Inactive  . Days of Exercise per Week: 0 days  . Minutes of Exercise per Session: 0 min  Stress:   . Feeling of Stress : Not on file  Social Connections:   . Frequency of Communication with Friends and Family: Not on file  . Frequency of Social Gatherings with Friends and Family: Not  on file  . Attends Religious Services: Not on file  . Active Member of Clubs or Organizations: Not on file  . Attends Archivist Meetings: Not on file  . Marital Status: Not on file  Intimate Partner Violence:   . Fear of Current or Ex-Partner: Not on file  . Emotionally Abused: Not on file  . Physically Abused: Not on file  . Sexually Abused: Not on file    No Known Allergies  Family History  Problem Relation Age of Onset  . Heart disease Brother   . Heart disease Mother   . Heart disease Father   . Cancer Paternal Uncle      Prior to Admission medications   Medication Sig Start Date End Date Taking? Authorizing Provider  albuterol (PROAIR HFA) 108 (90  Base) MCG/ACT inhaler Inhale 2 puffs into the lungs every 6 (six) hours as needed for wheezing or shortness of breath.    [provider]  albuterol (PROVENTIL) (2.5 MG/3ML) 0.083% nebulizer solution Take 3 mLs (2.5 mg total) by nebulization every 6 (six) hours as needed for wheezing or shortness of breath. 02/28/19   [name], PA-C  apixaban (ELIQUIS) 5 MG TABS tablet Take 1 tablet (5 mg total) by mouth 2 (two) times daily. 12/27/19   Jerline Pain, MD  Ascorbic Acid (VITAMIN C) 500 MG CAPS Take 1 capsule by mouth daily.    [provider]  atorvastatin (LIPITOR) 40 MG tablet TAKE 1 TABLET(40 MG) BY MOUTH DAILY AFTER SUPPER Patient taking differently: Take 40 mg by mouth daily at 6 PM.  12/27/19   Jerline Pain, MD  azithromycin (ZITHROMAX Z-PAK) 250 MG tablet Take 500 mg on day 1, then 250 mg once a day for days 2, 3, 4, and 5. 01/30/20   Cardama, Grayce Sessions, MD  bisoprolol (ZEBETA) 5 MG tablet One daily 01/27/20   Tanda Rockers, MD  calcitonin, salmon, (MIACALCIN/FORTICAL) 200 UNIT/ACT nasal spray Place 1 spray into alternate nostrils daily. 01/11/20   Hoyt Koch, MD  Calcium Carbonate-Vit D-Min (CALCIUM 600+D PLUS MINERALS) 600-400 MG-UNIT TABS Take by mouth daily.    [provider]  Cholecalciferol (VITAMIN D) 50 MCG (2000 UT) tablet Take 2,000 Units by mouth daily.    [provider]  diltiazem (CARDIZEM CD) 360 MG 24 hr capsule Take 1 capsule (360 mg total) by mouth daily. 09/24/19   Jerline Pain, MD  escitalopram (LEXAPRO) 5 MG tablet Take 1 tablet (5 mg total) by mouth daily. 02/01/20   Cox, Elnita Maxwell, MD  Fluticasone-Umeclidin-Vilant (TRELEGY ELLIPTA) 100-62.5-25 MCG/INH AEPB Inhale 1 puff into the lungs daily. 10/26/19   Tanda Rockers, MD  Fluticasone-Umeclidin-Vilant (TRELEGY ELLIPTA) 100-62.5-25 MCG/INH AEPB Inhale 1 puff into the lungs daily. 01/27/20   Tanda Rockers, MD  furosemide (LASIX) 40 MG tablet Take 1 tablet (40 mg total) by  mouth daily. 12/27/19 03/26/20  Jerline Pain, MD  guaiFENesin (MUCINEX) 600 MG 12 hr tablet Take 2 tablets (1,200 mg total) by mouth 2 (two) times daily as needed. 01/27/20   Tanda Rockers, MD  loratadine (CLARITIN) 10 MG tablet Take 10 mg by mouth daily.    [provider]  LORazepam (ATIVAN) 0.5 MG tablet Take 1 tablet (0.5 mg total) by mouth daily as needed for anxiety. 02/01/20   Cox, Elnita Maxwell, MD  omeprazole (PRILOSEC) 20 MG capsule Take 20 mg by mouth 2 (two) times daily before a meal.    [provider]  OXYGEN Inhale 2 L into the lungs continuous. With sleep only  Adapt    [provider]  predniSONE (DELTASONE) 20 MG tablet One daily ok to double dose if breathing worse 01/27/20   Tanda Rockers, MD  tiZANidine (ZANAFLEX) 2 MG tablet Take 1 tablet (2 mg total) by mouth every 6 (six) hours as needed for muscle spasms. 01/25/20   [name], MD    Physical Exam: Vitals:   02/02/20 1516 02/02/20 1523 02/02/20 1530 02/02/20 1733  BP:   (!) 143/64 131/71  Pulse:   85 84  Resp:   (!) 24 (!) 22  Temp:    98.7 F (37.1 C)  TempSrc:    Oral  SpO2: 98% 95% 97% 98%  Weight:      Height:         . General:  Appears in mild distress due to respiratory distress . Eyes:  PERRL, EOMI, normal lids, iris . ENT: grossly normal hearing, lips & tongue, mmm . Neck:  no LAD, masses or thyromegaly . Cardiovascular:  RRR, no m/r/g. No LE edema.  Marland Kitchen Respiratory: Bilateral wheezing , no w/r/r. Normal respiratory effort. . Abdomen:  soft, ntnd, NABS . Skin:  no rash or induration seen on limited exam . Musculoskeletal:  grossly normal tone BUE/BLE, good ROM, no bony abnormality . Psychiatric:  grossly normal mood and affect, speech fluent and appropriate, AOx3 . Neurologic:  CN 2-12 grossly intact, moves all extremities in coordinated fashion, sensation intact  Labs on Admission: I have personally reviewed following labs and imaging studies  CBC: Recent Labs  Lab  02/02/20 1200  WBC 16.6*  NEUTROABS 14.7*  HGB 13.3  HCT 43.1  MCV 83.0  PLT [id number]   Basic Metabolic Panel: Recent Labs  Lab 02/02/20 1200  NA 136  K 3.5  CL 96*  CO2 29  GLUCOSE 199*  BUN 13  CREATININE 0.66  CALCIUM 8.8*   GFR: Estimated Creatinine Clearance: 65.2 mL/min (by C-G formula based on SCr of 0.66 mg/dL). Liver Function Tests: No results for input(s): AST, ALT, ALKPHOS, BILITOT, PROT, ALBUMIN in the last 168 hours. No results for input(s): LIPASE, AMYLASE in the last 168 hours. No results for input(s): AMMONIA in the last 168 hours. Coagulation Profile: No results for input(s): INR, PROTIME in the last 168 hours. Cardiac Enzymes: No results for input(s): CKTOTAL, CKMB, CKMBINDEX, TROPONINI in the last 168 hours. BNP (last 3 results) No results for input(s): PROBNP in the last 8760 hours. HbA1C: No results for input(s): HGBA1C in the last 72 hours. CBG: No results for input(s): GLUCAP in the last 168 hours. Lipid Profile: No results for input(s): CHOL, HDL, LDLCALC, TRIG, CHOLHDL, LDLDIRECT in the last 72 hours. Thyroid Function Tests: No results for input(s): TSH, T4TOTAL, FREET4, T3FREE, THYROIDAB in the last 72 hours. Anemia Panel: No results for input(s): VITAMINB12, FOLATE, FERRITIN, TIBC, IRON, RETICCTPCT in the last 72 hours. Urine analysis:    Component Value Date/Time   COLORURINE YELLOW 03/03/2019 1325   APPEARANCEUR CLEAR 03/03/2019 1325   LABSPEC <1.005 (L) 03/03/2019 1325   PHURINE 6.5 03/03/2019 1325   GLUCOSEU NEGATIVE 03/03/2019 1325   HGBUR NEGATIVE 03/03/2019 1325   BILIRUBINUR NEGATIVE 03/03/2019 1325   KETONESUR NEGATIVE 03/03/2019 1325   PROTEINUR NEGATIVE 03/03/2019 1325   NITRITE NEGATIVE 03/03/2019 1325   LEUKOCYTESUR NEGATIVE 03/03/2019 1325    Creatinine Clearance: Estimated Creatinine Clearance: 65.2 mL/min (by C-G formula based on SCr of 0.66 mg/dL).  Sepsis Labs: @LABRCNTIP (procalcitonin:4,lacticidven:4) ) Recent  Results (from the past 240 hour(s))  SARS Coronavirus 2 by RT PCR (hospital order, performed in [facility] hospital lab) Nasopharyngeal Nasopharyngeal Swab     Status: None   Collection Time: 02/02/20  1:11 PM   Specimen: Nasopharyngeal Swab  Result Value Ref Range Status   SARS Coronavirus 2 NEGATIVE NEGATIVE Final    Comment: (NOTE) SARS-CoV-2 target nucleic acids are NOT DETECTED. The SARS-CoV-2 RNA is generally detectable in upper and lower respiratory specimens during the acute phase of infection. The lowest concentration of SARS-CoV-2 viral copies this assay can detect is 250 copies / mL. A negative result does not preclude SARS-CoV-2 infection and should not be used as the sole basis for treatment or other patient management decisions.  A negative result may occur with improper specimen collection / handling, submission of specimen other than nasopharyngeal swab, presence of viral mutation(s) within the areas targeted by this assay, and inadequate number of viral copies (<250 copies / mL). A negative result must be combined with clinical observations, patient history, and epidemiological information. Fact Sheet for Patients:   StrictlyIdeas.no Fact Sheet for Healthcare Providers: BankingDealers.co.za This test is not yet approved or cleared  by the [place] FDA and has been authorized for detection and/or diagnosis of SARS-CoV-2 by FDA under an Emergency Use Authorization (EUA).  This EUA will remain in effect (meaning this test can be used) for the duration of the COVID-19 declaration under Section 564(b)(1) of the Act, 21 U.S.C. section 360bbb-3(b)(1), unless the authorization is terminated or revoked sooner. Performed at [facility], [address] [place], [place] [address]      Radiological Exams on Admission: DG Chest [facility] 1 View  Result Date: 02/02/2020 CLINICAL DATA:  Cough, COPD EXAM: PORTABLE CHEST  1 VIEW COMPARISON:  01/06/2020 FINDINGS: The lungs are hyperinflated likely secondary to COPD. There is no focal consolidation. There is no pleural effusion or pneumothorax. The heart and mediastinal contours are unremarkable. There is no acute osseous abnormality. IMPRESSION: No active disease. Electronically Signed   By: Kathreen Devoid   On: 02/02/2020 13:10     Assessment/Plan Active Problems:   COPD with acute exacerbation (HCC)   #1 acute hypoxic respiratory failure secondary to COPD exacerbation-he is admitted with productive cough and shortness of breath and hypoxemia worse than his baseline.  He is on 2 L of oxygen at home at night only.  Currently on 3 L saturating above 92%. I will treat him with IV Solu-Medrol Nebulizer  #2 chronic atrial fibrillation restart Eliquis on bisoprolol and Cardizem  #3 hyperlipidemia on Lipitor  #4 depression on Lexapro  #5 muscle spasm continue muscle relaxant as he was taking at home.  Severity of Illness: The appropriate patient status for this patient is INPATIENT. Inpatient status is judged to be reasonable and necessary in order to provide the required intensity of service to ensure the patient's safety. The patient's presenting symptoms, physical exam findings, and initial radiographic and laboratory data in the context of their chronic comorbidities is felt to place them at high risk for further clinical deterioration. Furthermore, it is not anticipated that the patient will be medically stable for discharge from the hospital within 2 midnights of admission. The following factors support the patient status of inpatient.   " The patient's presenting symptoms include productive cough and pleuritic chest pain and shortness of breath " The worrisome physical exam findings include bilateral wheezing and respiratory distress "  The initial radiographic and laboratory data are worrisome because of hypoxia leukocytosis " The chronic co-morbidities  include O2 dependent and steroid-dependent COPD   * I certify that at the point of admission it is my clinical judgment that the patient will require inpatient hospital care spanning beyond 2 midnights from the point of admission due to high intensity of service, high risk for further deterioration and high frequency of surveillance required.*    Estimated body mass index is 25.5 kg/m as calculated from the following:   Height as of this encounter: 5\' 3"  (1.6 m).   Weight as of this encounter: 65.3 kg.   DVT prophylaxis: Eliquis Code Status: Full code Family Communication: None Disposition Plan: Pending clinical improvement patient came from home plan discharge back to home once COPD exacerbation has been resolved back to his baseline. Consults called: None Admission status: Inpatient   Georgette Shell MD [facility]  If 7PM-7AM, please contact night-coverage [email or web address] Password [facility]  02/02/2020, 5:34 PM

## 2020-02-02 NOTE — ED Provider Notes (Signed)
[facility] EMERGENCY DEPARTMENT Provider Note   CSN: [id number] Arrival date & time: 02/02/20  1132     History Chief Complaint  Patient presents with  . Shortness of Breath    Paul [name] is a 74 y.o. male.  HPI 74 year old male presents with COPD exacerbation.  Started about 3 days ago.  Was seen here.  Has not been any better.  May be worsening.  He was given azithromycin with no relief.  Has been using his albuterol at home with no relief.  Some cough as well as chest tightness and shortness of breath.  No fevers. No leg swelling. He has oxygen at home for nightly use but he has to use it anytime he is walking around at home.  Past Medical History:  Diagnosis Date  . A-fib ([place])   . COPD (chronic obstructive pulmonary disease) ([place])   . High cholesterol     Patient Active Problem List   Diagnosis Date Noted  . Chronic cough 01/31/2020  . Essential hypertension 01/28/2020  . Encounter for general adult medical examination with abnormal findings 01/25/2020  . Chronic bilateral thoracic back pain 01/25/2020  . 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 SOB (shortness of breath)   . HLD (hyperlipidemia) 01/05/2020  . Vertebral compression fracture ([place]) 05/29/2019  . Chronic anemia 05/29/2019  . Pulmonary nodules 05/29/2019  . Viral pneumonitis 05/28/2019  . COPD with acute exacerbation ([place]) 05/28/2019  . Chronic anticoagulation 04/06/2019  . Coronary artery calcification 04/06/2019  . Atherosclerosis of aorta ([place]) 04/06/2019  . Paroxysmal atrial fibrillation ([facility])   . Acute on chronic respiratory failure with hypoxia ([place])   . COPD exacerbation ([place]) 03/19/2019  . Lung nodule < 6cm on CT 05/29/2016  . Former cigarette smoker 12/05/2015  . Chronic respiratory failure with hypoxia ([place]) 01/27/2013  . COPD GOLD  III      Past Surgical History:  Procedure Laterality Date  . CATARACT EXTRACTION Right   . SKIN SURGERY     shoulders and chest       Family History  Problem Relation Age of Onset  . Heart disease Brother   . Heart disease Mother   . Heart disease Father   . Cancer Paternal Uncle     Social History   Tobacco Use  . Smoking status: Former Smoker    Packs/day: 2.00    Years: 52.00    Pack years: 104.00    Types: Cigarettes    Quit date: 02/03/2009    Years since quitting: 11.0  . Smokeless tobacco: Never Used  . Tobacco comment: Counseled to remain smoke free  Substance Use Topics  . Alcohol use: Yes    Alcohol/week: 0.0 standard drinks    Comment: occ  . Drug use: No    Home Medications Prior to Admission medications   Medication Sig Start Date End Date Taking? Authorizing Provider  albuterol (PROAIR HFA) 108 (90 Base) MCG/ACT inhaler Inhale 2 puffs into the lungs every 6 (six) hours as needed for wheezing or shortness of breath.    [provider]  albuterol (PROVENTIL) (2.5 MG/3ML) 0.083% nebulizer solution Take 3 mLs (2.5 mg total) by nebulization every 6 (six) hours as needed for wheezing or shortness of breath. 02/28/19   [name], PA-C  apixaban (ELIQUIS) 5 MG TABS tablet Take 1 tablet (5 mg total) by mouth 2 (two) times daily. 12/27/19   Jerline Pain, MD  Ascorbic Acid (VITAMIN C) 500  MG CAPS Take 1 capsule by mouth daily.    [provider]  atorvastatin (LIPITOR) 40 MG tablet TAKE 1 TABLET(40 MG) BY MOUTH DAILY AFTER SUPPER Patient taking differently: Take 40 mg by mouth daily at 6 PM.  12/27/19   Jerline Pain, MD  azithromycin (ZITHROMAX Z-PAK) 250 MG tablet Take 500 mg on day 1, then 250 mg once a day for days 2, 3, 4, and 5. 01/30/20   Cardama, Grayce Sessions, MD  bisoprolol (ZEBETA) 5 MG tablet One daily 01/27/20   Tanda Rockers, MD  calcitonin, salmon, (MIACALCIN/FORTICAL) 200 UNIT/ACT nasal spray Place 1 spray into alternate nostrils daily. 01/11/20   Hoyt Koch, MD  Calcium Carbonate-Vit D-Min (CALCIUM 600+D PLUS MINERALS) 600-400  MG-UNIT TABS Take by mouth daily.    [provider]  Cholecalciferol (VITAMIN D) 50 MCG (2000 UT) tablet Take 2,000 Units by mouth daily.    [provider]  diltiazem (CARDIZEM CD) 360 MG 24 hr capsule Take 1 capsule (360 mg total) by mouth daily. 09/24/19   Jerline Pain, MD  escitalopram (LEXAPRO) 5 MG tablet Take 1 tablet (5 mg total) by mouth daily. 02/01/20   Cox, Elnita Maxwell, MD  Fluticasone-Umeclidin-Vilant (TRELEGY ELLIPTA) 100-62.5-25 MCG/INH AEPB Inhale 1 puff into the lungs daily. 10/26/19   Tanda Rockers, MD  Fluticasone-Umeclidin-Vilant (TRELEGY ELLIPTA) 100-62.5-25 MCG/INH AEPB Inhale 1 puff into the lungs daily. 01/27/20   Tanda Rockers, MD  furosemide (LASIX) 40 MG tablet Take 1 tablet (40 mg total) by mouth daily. 12/27/19 03/26/20  Jerline Pain, MD  guaiFENesin (MUCINEX) 600 MG 12 hr tablet Take 2 tablets (1,200 mg total) by mouth 2 (two) times daily as needed. 01/27/20   Tanda Rockers, MD  loratadine (CLARITIN) 10 MG tablet Take 10 mg by mouth daily.    [provider]  LORazepam (ATIVAN) 0.5 MG tablet Take 1 tablet (0.5 mg total) by mouth daily as needed for anxiety. 02/01/20   Cox, Elnita Maxwell, MD  omeprazole (PRILOSEC) 20 MG capsule Take 20 mg by mouth 2 (two) times daily before a meal.    [provider]  OXYGEN Inhale 2 L into the lungs continuous. With sleep only  Adapt    [provider]  predniSONE (DELTASONE) 20 MG tablet One daily ok to double dose if breathing worse 01/27/20   Tanda Rockers, MD  tiZANidine (ZANAFLEX) 2 MG tablet Take 1 tablet (2 mg total) by mouth every 6 (six) hours as needed for muscle spasms. 01/25/20   [name], MD    Allergies    Patient has no known allergies.  Review of Systems   Review of Systems  Constitutional: Negative for fever.  Respiratory: Positive for cough, chest tightness and shortness of breath.   Cardiovascular: Negative for leg swelling.  All other systems reviewed and are  negative.   Physical Exam Updated Vital Signs BP 123/62 (BP Location: Right Arm)   Pulse 78   Temp 98.3 F (36.8 C) (Oral)   Resp 19   Ht 5\' 3"  (1.6 m)   Wt 65.3 kg   SpO2 98%   BMI 25.50 kg/m   Physical Exam Vitals and nursing note reviewed.  Constitutional:      Appearance: He is well-developed.  HENT:     Head: Normocephalic and atraumatic.     Right Ear: External ear normal.     Left Ear: External ear normal.     Nose: Nose normal.  Eyes:  General:        Right eye: No discharge.        Left eye: No discharge.  Cardiovascular:     Rate and Rhythm: Normal rate and regular rhythm.     Heart sounds: Normal heart sounds.  Pulmonary:     Effort: Pulmonary effort is normal. Tachypnea present. No respiratory distress.     Breath sounds: Decreased breath sounds present.  Abdominal:     Palpations: Abdomen is soft.     Tenderness: There is no abdominal tenderness.  Musculoskeletal:     Cervical back: Neck supple.     Right lower leg: No edema.     Left lower leg: No edema.  Skin:    General: Skin is warm and dry.  Neurological:     Mental Status: He is alert.  Psychiatric:        Mood and Affect: Mood is not anxious.     ED Results / Procedures / Treatments   Labs (all labs ordered are listed, but only abnormal results are displayed) Labs Reviewed  BASIC METABOLIC PANEL - Abnormal; Notable for the following components:      Result Value   Chloride 96 (*)    Glucose, Bld 199 (*)    Calcium 8.8 (*)    All other components within normal limits  CBC WITH DIFFERENTIAL/PLATELET - Abnormal; Notable for the following components:   WBC 16.6 (*)    MCH 25.6 (*)    Neutro Abs 14.7 (*)    Abs Immature Granulocytes 0.09 (*)    All other components within normal limits  SARS CORONAVIRUS 2 BY RT PCR (HOSPITAL ORDER, [facility])  CULTURE, BLOOD (ROUTINE X 2)  CULTURE, BLOOD (ROUTINE X 2)  BRAIN NATRIURETIC PEPTIDE  TROPONIN I (HIGH  SENSITIVITY)    EKG EKG Interpretation  Date/Time:  Wednesday February 02 2020 11:47:41 EST Ventricular Rate:  83 PR Interval:    QRS Duration: 91 QT Interval:  346 QTC Calculation: 407 R Axis:   75 Text Interpretation: Sinus rhythm Anteroseptal infarct, old poor data quality, otherwise no obvious ST/T changes Confirmed by Sherwood Gambler [phone]) on 02/02/2020 12:05:45 PM   Radiology DG Chest Port 1 View  Result Date: 02/02/2020 CLINICAL DATA:  Cough, COPD EXAM: PORTABLE CHEST 1 VIEW COMPARISON:  01/06/2020 FINDINGS: The lungs are hyperinflated likely secondary to COPD. There is no focal consolidation. There is no pleural effusion or pneumothorax. The heart and mediastinal contours are unremarkable. There is no acute osseous abnormality. IMPRESSION: No active disease. Electronically Signed   By: Kathreen Devoid   On: 02/02/2020 13:10    Procedures .Critical Care Performed by: Sherwood Gambler, MD Authorized by: Sherwood Gambler, MD   Critical care provider statement:    Critical care time (minutes):  30   Critical care time was exclusive of:  Separately billable procedures and treating other patients   Critical care was necessary to treat or prevent imminent or life-threatening deterioration of the following conditions:  Respiratory failure   Critical care was time spent personally by me on the following activities:  Discussions with consultants, evaluation of patient's response to treatment, examination of patient, ordering and performing treatments and interventions, ordering and review of laboratory studies, ordering and review of radiographic studies, pulse oximetry, re-evaluation of patient's condition, obtaining history from patient or surrogate and review of old charts   (including critical care time)  Medications Ordered in ED Medications  magnesium sulfate IVPB 2 g 50  mL (2 g Intravenous New Bag/Given 02/02/20 1307)  methylPREDNISolone sodium succinate (SOLU-MEDROL) 125 mg/2  mL injection 125 mg (125 mg Intravenous Given 02/02/20 1309)  albuterol (VENTOLIN HFA) 108 (90 Base) MCG/ACT inhaler 8 puff (8 puffs Inhalation Given 02/02/20 1220)  ipratropium (ATROVENT HFA) inhaler 2 puff (2 puffs Inhalation Given 02/02/20 1220)  albuterol (PROVENTIL) (2.5 MG/3ML) 0.083% nebulizer solution 2.5 mg (2.5 mg Nebulization Given 02/02/20 1516)  ipratropium-albuterol (DUONEB) 0.5-2.5 (3) MG/3ML nebulizer solution 3 mL (3 mLs Nebulization Given 02/02/20 1516)    ED Course  I have reviewed the triage vital signs and the nursing notes.  Pertinent labs & imaging results that were available during my care of the patient were reviewed by me and considered in my medical decision making (see chart for details).    MDM Rules/Calculators/A&P                      Work of breathing has improved with 2 doses of albuterol.  He was also given Solu-Medrol and magnesium.  Still little bit tachypneic and while he is nondistressed I think he will need steroids and further supportive care.  Covid testing is negative.  Discussed with Dr. Karleen Hampshire for admission.  Jaryan Mckinzie was evaluated in Emergency Department on 02/02/2020 for the symptoms described in the history of present illness. He was evaluated in the context of the global COVID-19 pandemic, which necessitated consideration that the patient might be at risk for infection with the SARS-CoV-2 virus that causes COVID-19. Institutional protocols and algorithms that pertain to the evaluation of patients at risk for COVID-19 are in a state of rapid change based on information released by regulatory bodies including the CDC and federal and state organizations. These policies and algorithms were followed during the patient's care in the ED.  Final Clinical Impression(s) / ED Diagnoses Final diagnoses:  COPD exacerbation [facility])    Rx / DC Orders ED Discharge Orders    None       Sherwood Gambler, MD 02/02/20 1521

## 2020-02-02 NOTE — Telephone Encounter (Signed)
FYI. Please see message about patient being admitted to the hospital today.

## 2020-02-03 ENCOUNTER — Inpatient Hospital Stay (HOSPITAL_COMMUNITY): Payer: Medicare Other

## 2020-02-03 MED ORDER — IOHEXOL 300 MG/ML  SOLN
75.0000 mL | Freq: Once | INTRAMUSCULAR | Status: AC | PRN
  Administered 2020-02-03: 75 mL via INTRAVENOUS

## 2020-02-03 MED ORDER — FUROSEMIDE 40 MG PO TABS
40.0000 mg | ORAL_TABLET | Freq: Every day | ORAL | Status: DC
  Administered 2020-02-03 – 2020-02-07 (×5): 40 mg via ORAL
  Filled 2020-02-03 (×5): qty 1

## 2020-02-03 MED ORDER — MOMETASONE FURO-FORMOTEROL FUM 200-5 MCG/ACT IN AERO
2.0000 | INHALATION_SPRAY | Freq: Two times a day (BID) | RESPIRATORY_TRACT | Status: DC
  Administered 2020-02-03 – 2020-02-07 (×8): 2 via RESPIRATORY_TRACT
  Filled 2020-02-03: qty 8.8

## 2020-02-03 NOTE — Telephone Encounter (Signed)
[place]

## 2020-02-03 NOTE — Progress Notes (Signed)
Pt ambulated in hall this afternoon. Walked 100 feet, needed to stop and catch breath d/t being SOB, pulse ox read 85%. Pt did deep breathing exercises and walked back to room and applied his [place] 2L O2. Pt's oxygen level came up to 96-98 on 2L and pt needed encouragement to slow breathing down. Pt and spouse express not being ready to go home yet. Pt also states that in the last 3 weeks he has been needing his home oxygen more frequently other than at night.

## 2020-02-03 NOTE — Assessment & Plan Note (Signed)
Recommend healthy diet and gradually increasing exercise.

## 2020-02-03 NOTE — Patient Instructions (Signed)

## 2020-02-03 NOTE — Progress Notes (Signed)
PROGRESS NOTE    Paul [name]  [id number] DOB: 1946/09/15 DOA: 02/02/2020 PCP: [name], MD  Brief Narrative: 74 y.o. male with medical history significant of COPD steroid-dependent, O2 dependent at night, chronic atrial fibrillation and hyperlipidemia transferred from [facility] with significant worsening of his shortness of breath and cough with productive phlegm.  He was started on azithromycin as an outpatient.  He reported his phlegm was green then turned yellow.  He also complains of pleuritic chest pain.  No nausea vomiting palpitations diarrhea loss of consciousness.  No urinary complaints.  No leg swelling.  No fever or Covid exposure.  He lives with his wife at home who is well.      ED Course: Patient was given Solu-Medrol and nebulizer. Review of Systems: As per HPI otherwise all other systems reviewed and are negative Sodium 136 potassium 3.5 BUN 13 creatinine 0.66 troponin was 4 BNP was 87 white count was 16.6 hemoglobin 13.3 platelet count 373. Covid negative. Blood cultures were drawn.  Chest x-ray no active disease.  Assessment & Plan:   Active Problems:   COPD with acute exacerbation ([place])   #1 acute on chronic  hypoxic respiratory failure secondary to COPD exacerbation-he is admitted with productive cough and shortness of breath and hypoxemia worse than his baseline.  He is on 2 L of oxygen at home at night only.  Currently on 3 L saturating above 92%. His saturation dropped to 83 with exertion and ambulating to the restroom today. I will continue IV Solu-Medrol, nebulizer, Mucinex and Claritin. Will order CT of the sinus while in hospital. He has had recurrent hospital admissions for the same.  He continues to have chronic productive cough with green to yellow phlegm.  #2 chronic atrial fibrillation continue Eliquis on bisoprolol and Cardizem  #3 hyperlipidemia on Lipitor  #4 depression on Lexapro  #5 muscle spasm continue muscle relaxant  as he was taking at home.    Estimated body mass index is 25.5 kg/m as calculated from the following:   Height as of this encounter: 5\' 3"  (1.6 m).   Weight as of this encounter: 65.3 kg.  DVT prophylaxis: Eliquis  code Status: Full code Family Communication: Discussed with wife Disposition Plan: Patient coming from home plan is to discharge him to home. Barriers to discharge patient hypoxic on exertion on room air.  Continues to have hypoxia and diffuse wheezing.   Consultants:   None  Procedures: None Antimicrobials: None  Subjective: Patient resting in bed feels slightly better than yesterday but not yet back to his baseline.  Continues to be hypoxic with exertion and continues to have dyspnea on exertion.  Objective: Vitals:   02/02/20 2136 02/03/20 0453 02/03/20 0743 02/03/20 0926  BP: (!) 151/62 (!) 142/62  (!) 125/59  Pulse: 77 76  87  Resp: 19 14  16   Temp: 98.5 F (36.9 C) 98.2 F (36.8 C)  97.9 F (36.6 C)  TempSrc: Oral Oral  Oral  SpO2: 98% 99% 94% 95%  Weight:      Height:        Intake/Output Summary (Last 24 hours) at 02/03/2020 1135 Last data filed at 02/03/2020 [id number] Gross per 24 hour  Intake 480 ml  Output 1140 ml  Net -660 ml   Filed Weights   02/02/20 1147  Weight: 65.3 kg    Examination:  General exam: Appears calm and comfortable  Respiratory system: Clear to auscultation. Respiratory effort normal. Cardiovascular system: S1 & S2  heard, RRR. No JVD, murmurs, rubs, gallops or clicks. No pedal edema. Gastrointestinal system: Abdomen is nondistended, soft and nontender. No organomegaly or masses felt. Normal bowel sounds heard. Central nervous system: Alert and oriented. No focal neurological deficits. Extremities: Symmetric 5 x 5 power. Skin: No rashes, lesions or ulcers Psychiatry: Judgement and insight appear normal. Mood & affect appropriate.     Data Reviewed: I have personally reviewed following labs and imaging  studies  CBC: Recent Labs  Lab 02/02/20 1200  WBC 16.6*  NEUTROABS 14.7*  HGB 13.3  HCT 43.1  MCV 83.0  PLT [id number]   Basic Metabolic Panel: Recent Labs  Lab 02/02/20 1200  NA 136  K 3.5  CL 96*  CO2 29  GLUCOSE 199*  BUN 13  CREATININE 0.66  CALCIUM 8.8*   GFR: Estimated Creatinine Clearance: 65.2 mL/min (by C-G formula based on SCr of 0.66 mg/dL). Liver Function Tests: No results for input(s): AST, ALT, ALKPHOS, BILITOT, PROT, ALBUMIN in the last 168 hours. No results for input(s): LIPASE, AMYLASE in the last 168 hours. No results for input(s): AMMONIA in the last 168 hours. Coagulation Profile: No results for input(s): INR, PROTIME in the last 168 hours. Cardiac Enzymes: No results for input(s): CKTOTAL, CKMB, CKMBINDEX, TROPONINI in the last 168 hours. BNP (last 3 results) No results for input(s): PROBNP in the last 8760 hours. HbA1C: No results for input(s): HGBA1C in the last 72 hours. CBG: No results for input(s): GLUCAP in the last 168 hours. Lipid Profile: No results for input(s): CHOL, HDL, LDLCALC, TRIG, CHOLHDL, LDLDIRECT in the last 72 hours. Thyroid Function Tests: No results for input(s): TSH, T4TOTAL, FREET4, T3FREE, THYROIDAB in the last 72 hours. Anemia Panel: No results for input(s): VITAMINB12, FOLATE, FERRITIN, TIBC, IRON, RETICCTPCT in the last 72 hours. Sepsis Labs: No results for input(s): PROCALCITON, LATICACIDVEN in the last 168 hours.  Recent Results (from the past 240 hour(s))  SARS Coronavirus 2 by RT PCR (hospital order, performed in [facility] hospital lab) Nasopharyngeal Nasopharyngeal Swab     Status: None   Collection Time: 02/02/20  1:11 PM   Specimen: Nasopharyngeal Swab  Result Value Ref Range Status   SARS Coronavirus 2 NEGATIVE NEGATIVE Final    Comment: (NOTE) SARS-CoV-2 target nucleic acids are NOT DETECTED. The SARS-CoV-2 RNA is generally detectable in upper and lower respiratory specimens during the acute phase of  infection. The lowest concentration of SARS-CoV-2 viral copies this assay can detect is 250 copies / mL. A negative result does not preclude SARS-CoV-2 infection and should not be used as the sole basis for treatment or other patient management decisions.  A negative result may occur with improper specimen collection / handling, submission of specimen other than nasopharyngeal swab, presence of viral mutation(s) within the areas targeted by this assay, and inadequate number of viral copies (<250 copies / mL). A negative result must be combined with clinical observations, patient history, and epidemiological information. Fact Sheet for Patients:   StrictlyIdeas.no Fact Sheet for Healthcare Providers: BankingDealers.co.za This test is not yet approved or cleared  by the [place] FDA and has been authorized for detection and/or diagnosis of SARS-CoV-2 by FDA under an Emergency Use Authorization (EUA).  This EUA will remain in effect (meaning this test can be used) for the duration of the COVID-19 declaration under Section 564(b)(1) of the Act, 21 U.S.C. section 360bbb-3(b)(1), unless the authorization is terminated or revoked sooner. Performed at [facility], [address] [place] [address]          Radiology Studies: [facility] Chest [address] 1 View  Result Date: 02/02/2020 CLINICAL DATA:  Cough, COPD EXAM: PORTABLE CHEST 1 VIEW COMPARISON:  01/06/2020 FINDINGS: The lungs are hyperinflated likely secondary to COPD. There is no focal consolidation. There is no pleural effusion or pneumothorax. The heart and mediastinal contours are unremarkable. There is no acute osseous abnormality. IMPRESSION: No active disease. Electronically Signed   By: Kathreen Devoid   On: 02/02/2020 13:10        Scheduled Meds: . albuterol  2.5 mg Nebulization QID  . apixaban  5 mg Oral BID  . atorvastatin  40 mg Oral q1800  . bisoprolol  5 mg  Oral Daily  . diltiazem  360 mg Oral Daily  . escitalopram  5 mg Oral Daily  . guaiFENesin  1,200 mg Oral BID  . loratadine  10 mg Oral Daily  . methylPREDNISolone (SOLU-MEDROL) injection  60 mg Intravenous Q6H  . umeclidinium bromide  1 puff Inhalation Daily   Continuous Infusions:   LOS: 1 day     Georgette Shell, MD  02/03/2020, 11:35 AM

## 2020-02-04 LAB — CBC
HCT: 34.6 % — ABNORMAL LOW (ref 39.0–52.0)
Hemoglobin: 10.8 g/dL — ABNORMAL LOW (ref 13.0–17.0)
MCH: 25.2 pg — ABNORMAL LOW (ref 26.0–34.0)
MCHC: 31.2 g/dL (ref 30.0–36.0)
MCV: 80.7 fL (ref 80.0–100.0)
Platelets: 302 10*3/uL (ref 150–400)
RBC: 4.29 MIL/uL (ref 4.22–5.81)
RDW: 14.9 % (ref 11.5–15.5)
WBC: 11.7 10*3/uL — ABNORMAL HIGH (ref 4.0–10.5)
nRBC: 0 % (ref 0.0–0.2)

## 2020-02-04 LAB — COMPREHENSIVE METABOLIC PANEL
ALT: 31 U/L (ref 0–44)
AST: 15 U/L (ref 15–41)
Albumin: 3 g/dL — ABNORMAL LOW (ref 3.5–5.0)
Alkaline Phosphatase: 70 U/L (ref 38–126)
Anion gap: 8 (ref 5–15)
BUN: 24 mg/dL — ABNORMAL HIGH (ref 8–23)
CO2: 29 mmol/L (ref 22–32)
Calcium: 8.2 mg/dL — ABNORMAL LOW (ref 8.9–10.3)
Chloride: 102 mmol/L (ref 98–111)
Creatinine, Ser: 0.56 mg/dL — ABNORMAL LOW (ref 0.61–1.24)
GFR calc Af Amer: >60 mL/min (ref >60)
GFR calc non Af Amer: >60 mL/min (ref >60)
Glucose, Bld: 163 mg/dL — ABNORMAL HIGH (ref 70–99)
Potassium: 3.7 mmol/L (ref 3.5–5.1)
Sodium: 139 mmol/L (ref 135–145)
Total Bilirubin: 0.5 mg/dL (ref 0.3–1.2)
Total Protein: 6 g/dL — ABNORMAL LOW (ref 6.5–8.1)

## 2020-02-04 MED ORDER — ALBUTEROL SULFATE (2.5 MG/3ML) 0.083% IN NEBU: 2.5000 mg | INHALATION_SOLUTION | RESPIRATORY_TRACT | Status: DC | PRN

## 2020-02-04 MED ORDER — ALBUTEROL SULFATE (2.5 MG/3ML) 0.083% IN NEBU
2.5000 mg | INHALATION_SOLUTION | Freq: Three times a day (TID) | RESPIRATORY_TRACT | Status: DC
  Administered 2020-02-04 – 2020-02-05 (×3): 2.5 mg via RESPIRATORY_TRACT
  Filled 2020-02-04 (×3): qty 3

## 2020-02-04 NOTE — Care Management Important Message (Signed)
Important Message  Patient Details IM Letter given to Roque Lias SW Case Manager to present to the Patient Name: Paul [name] MRN: [id number] Date of Birth: March 14, 1946   Medicare Important Message Given:  Yes     Kerin Salen 02/04/2020, 11:08 AM

## 2020-02-04 NOTE — Progress Notes (Signed)
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 He was started on azithromycin as an outpatient. He reported his phlegm was green then turned yellow. He also complains of pleuritic chest pain. No nausea vomiting palpitations diarrhea loss of consciousness. No urinary complaints. No leg swelling. No fever or Covid exposure. He lives with his wife at home who is well.     ED Course:Patient was given Solu-Medrol and nebulizer. Review of Systems: As per HPI otherwise all other systems reviewed and are negative Sodium 136 potassium 3.5 BUN 13 creatinine 0.66 troponin was 4 BNP was 87 white count was 16.6 hemoglobin 13.3 platelet count 373. Covid negative. Blood cultures were drawn. Chest x-ray no active disease.  02/04/2020-patient ambulated in the hallway with drop in saturation to 85%.  He was placed on 2 L of oxygen and sats came up to 96 to 98%. CT of the chest and CT of the sinus done yesterday with no acute abnormalities.  Assessment & Plan:   Active Problems:   COPD with acute exacerbation ([place])   #1 acute on chronic  hypoxic respiratory failure secondary to COPD exacerbation-he is admitted with productive cough and shortness of breath and hypoxemia worse than his baseline. He is on 2 L of oxygen at home at night only. Currently on 3 L saturating above 92%.  He  desaturates easily  with any exertion.  Sats dropped to 85% with walking.  His wheezing has improved.  I will taper Solu-Medrol continue nebulizer Mucinex and Claritin.   CT of the sinus-minimal paranasal sinus mucosal thickening limited to the left ethmoid air cells not  convincing for acute sinusitis, symmetric but mild nasal cavity mucosal thickening raising the concern for mild rhinitis no other acute findings.  Calcified carotid artery atherosclerosis.  CT of the chest-coronary artery calcification suggesting CAD, emphysema upper lobes bilaterally.  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 Encourage out of bed Ambulate 3 times a day  #2 chronic atrial fibrillation  rate controlled on bisoprolol.  Continue Eliquis.    #3 hyperlipidemia on Lipitor  #4 depression on Lexapro  #5 muscle spasm continue muscle relaxant as he was taking at home   Estimated body mass index is 25.5 kg/m as calculated from the following:   Height as of this encounter: 5\' 3"  (1.6 m).   Weight as of this encounter: 65.3 kg.  DVT prophylaxis: Eliquis  code Status: Full code Family Communication: Discussed with wife Disposition Plan: Patient coming from home plan is to discharge him to home. Barriers to discharge patient hypoxic on exertion on room air.  Continues to have hypoxia and diffuse wheezing.   Consultants:   None  Procedures: None Antimicrobials: None   Subjective:  He is concerned that he only used oxygen at night and worried that he might have to start using during the day too.  Objective: Vitals:   02/03/20 2022 02/03/20 2044 02/04/20 0537 02/04/20 0754  BP:  (!) 136/56 122/65   Pulse:  81 72   Resp:  20 18   Temp:  98.9 F (37.2 C) 97.9 F (36.6 C)   TempSrc:  Axillary Oral   SpO2: 91% 94% 96% 90%  Weight:      Height:  Intake/Output Summary (Last 24 hours) at 02/04/2020 0911 Last data filed at 02/03/2020 1315 Gross per 24 hour  Intake 240 ml  Output 100 ml  Net 140 ml   Filed Weights   02/02/20 1147  Weight: 65.3 kg    Examination:  General exam: Appears calm and comfortable  Respiratory system: Still wheezing though decreased from yesterday to auscultation. Respiratory effort normal. Cardiovascular system: S1 & S2  heard, RRR. No JVD, murmurs, rubs, gallops or clicks. No pedal edema. Gastrointestinal system: Abdomen is nondistended, soft and nontender. No organomegaly or masses felt. Normal bowel sounds heard. Central nervous system: Alert and oriented. No focal neurological deficits. Extremities: Symmetric 5 x 5 power. Skin: No rashes, lesions or ulcers Psychiatry: Judgement and insight appear normal. Mood & affect appropriate.     Data Reviewed: I have personally reviewed following labs and imaging studies  CBC: Recent Labs  Lab 02/02/20 1200 02/04/20 0440  WBC 16.6* 11.7*  NEUTROABS 14.7*  --   HGB 13.3 10.8*  HCT 43.1 34.6*  MCV 83.0 80.7  PLT 373 [id number]   Basic Metabolic Panel: Recent Labs  Lab 02/02/20 1200 02/04/20 0440  NA 136 139  K 3.5 3.7  CL 96* 102  CO2 29 29  GLUCOSE 199* 163*  BUN 13 24*  CREATININE 0.66 0.56*  CALCIUM 8.8* 8.2*   GFR: Estimated Creatinine Clearance: 65.2 mL/min (A) (by C-G formula based on SCr of 0.56 mg/dL (L)). Liver Function Tests: Recent Labs  Lab 02/04/20 0440  AST 15  ALT 31  ALKPHOS 70  BILITOT 0.5  PROT 6.0*  ALBUMIN 3.0*   No results for input(s): LIPASE, AMYLASE in the last 168 hours. No results for input(s): AMMONIA in the last 168 hours. Coagulation Profile: No results for input(s): INR, PROTIME in the last 168 hours. Cardiac Enzymes: No results for input(s): CKTOTAL, CKMB, CKMBINDEX, TROPONINI in the last 168 hours. BNP (last 3 results) No results for input(s): PROBNP in the last 8760 hours. HbA1C: No results for input(s): HGBA1C in the last 72 hours. CBG: No results for input(s): GLUCAP in the last 168 hours. Lipid Profile: No results for input(s): CHOL, HDL, LDLCALC, TRIG, CHOLHDL, LDLDIRECT in the last 72 hours. Thyroid Function Tests: No results for input(s): TSH, T4TOTAL, FREET4, T3FREE, THYROIDAB in the last 72 hours. Anemia Panel: No results for input(s): VITAMINB12, FOLATE, FERRITIN, TIBC, IRON, RETICCTPCT in  the last 72 hours. Sepsis Labs: No results for input(s): PROCALCITON, LATICACIDVEN in the last 168 hours.  Recent Results (from the past 240 hour(s))  Culture, blood (routine x 2)     Status: None (Preliminary result)   Collection Time: 02/02/20 12:01 PM   Specimen: BLOOD  Result Value Ref Range Status   Specimen Description   Final    BLOOD LEFT ANTECUBITAL Performed at [facility], [address] [place], [place] [address]    Special Requests   Final    BOTTLES DRAWN AEROBIC AND ANAEROBIC Blood Culture adequate volume Performed at [facility], [address] [place], [place] [address]    Culture   Final    NO GROWTH < 24 HOURS Performed at [facility], [address], [place], [place] [address]    Report Status PENDING  Incomplete  SARS Coronavirus 2 by RT PCR (hospital order, performed in [facility] hospital lab) Nasopharyngeal Nasopharyngeal Swab     Status: None   Collection Time: 02/02/20  1:11 PM   Specimen: Nasopharyngeal  Swab  Result Value Ref Range Status   SARS Coronavirus 2 NEGATIVE NEGATIVE Final    Comment: (NOTE) SARS-CoV-2 target nucleic acids are NOT DETECTED. The SARS-CoV-2 RNA is generally detectable in upper and lower respiratory specimens during the acute phase of infection. The lowest concentration of SARS-CoV-2 viral copies this assay can detect is 250 copies / mL. A negative result does not preclude SARS-CoV-2 infection and should not be used as the sole basis for treatment or other patient management decisions.  A negative result may occur with improper specimen collection / handling, submission of specimen other than nasopharyngeal swab, presence of viral mutation(s) within the areas targeted by this assay, and inadequate number of viral copies (<250 copies / mL). A negative result must be combined with clinical observations, patient history, and epidemiological information. Fact Sheet for Patients:     StrictlyIdeas.no Fact Sheet for Healthcare Providers: BankingDealers.co.za This test is not yet approved or cleared  by the [place] FDA and has been authorized for detection and/or diagnosis of SARS-CoV-2 by FDA under an Emergency Use Authorization (EUA).  This EUA will remain in effect (meaning this test can be used) for the duration of the COVID-19 declaration under Section 564(b)(1) of the Act, 21 U.S.C. section 360bbb-3(b)(1), unless the authorization is terminated or revoked sooner. Performed at [facility], [address] [place], [place] [address]   Culture, blood (routine x 2)     Status: None (Preliminary result)   Collection Time: 02/02/20  1:28 PM   Specimen: BLOOD  Result Value Ref Range Status   Specimen Description   Final    BLOOD BLOOD RIGHT HAND Performed at [facility], [address] [place], [place] [address]    Special Requests   Final    BOTTLES DRAWN AEROBIC ONLY Blood Culture results may not be optimal due to an inadequate volume of blood received in culture bottles Performed at [facility], [address] [place], [place] [address]    Culture   Final    NO GROWTH < 24 HOURS Performed at [facility], [address], [place], [place] [address]    Report Status PENDING  Incomplete         Radiology Studies: CT CHEST W CONTRAST  Result Date: 02/03/2020 CLINICAL DATA:  COPD exacerbation. EXAM: CT CHEST WITH CONTRAST TECHNIQUE: Multidetector CT imaging of the chest was performed during intravenous contrast administration. CONTRAST:  35mL OMNIPAQUE IOHEXOL 300 MG/ML  SOLN COMPARISON:  May 28, 2019. FINDINGS: Cardiovascular: Atherosclerosis of thoracic aorta is noted without aneurysm formation. Normal cardiac size. No pericardial effusion. Coronary artery calcifications are noted. Mediastinum/Nodes: No enlarged mediastinal, hilar, or axillary lymph nodes. Thyroid  gland, trachea, and esophagus demonstrate no significant findings. Lungs/Pleura: No pneumothorax or pleural effusion is noted. Emphysematous disease is noted in the upper lobes bilaterally. Calcified biapical scarring is noted. No acute consolidative process is noted. Upper Abdomen: No acute abnormality. Musculoskeletal: There is interval development of several old thoracic compression fractures in the mid thoracic spine since prior exam. No definite acute fracture is noted. IMPRESSION: 1. Coronary artery calcifications are noted suggesting coronary artery disease. 2. Emphysematous disease is noted in the upper lobes bilaterally. 3. Interval development of several old thoracic compression fractures since prior exam. No definite acute fracture is noted. Aortic Atherosclerosis (ICD10-I70.0). Emphysema (ICD10-J43.9). Electronically Signed   By: Marijo Conception M.D.   On: 02/03/2020 16:36   DG Chest [facility]  1 View  Result Date: 02/02/2020 CLINICAL DATA:  Cough, COPD EXAM: PORTABLE CHEST 1 VIEW COMPARISON:  01/06/2020 FINDINGS: The lungs are hyperinflated likely secondary to COPD. There is no focal consolidation. There is no pleural effusion or pneumothorax. The heart and mediastinal contours are unremarkable. There is no acute osseous abnormality. IMPRESSION: No active disease. Electronically Signed   By: Kathreen Devoid   On: 02/02/2020 13:10   CT MAXILLOFACIAL WO CONTRAST  Result Date: 02/03/2020 CLINICAL DATA:  74 year old male with respiratory failure. COPD. Productive cough, shortness of breath. EXAM: CT MAXILLOFACIAL WITHOUT CONTRAST TECHNIQUE: Multidetector CT imaging of the maxillofacial structures was performed. Multiplanar CT image reconstructions were also generated. COMPARISON:  Head CT 06/17/2013. FINDINGS: Osseous: Absent dentition. No acute osseous abnormality identified. Lower cervical spine disc and endplate degeneration. Posterior element ankylosis at C2-C3. Orbits: Intact orbital walls.  Postoperative changes to both globes, otherwise negative noncontrast orbits soft tissues. Sinuses: Clear aside from isolated mild mucosal thickening or fluid in the left ethmoid air cell (coronal series 9, image 24). Symmetric but mild nasal cavity mucosal thickening, trace retained secretions in the right nasal cavity. Olfactory recesses are pneumatized. Nasal septum is intact. Bilateral tympanic cavities and mastoids are also well pneumatized. Soft tissues: Negative visible larynx, pharynx, parapharyngeal spaces, retropharyngeal space, sublingual space, submandibular and parotid glands. No upper cervical lymphadenopathy. Calcified bilateral carotid bifurcation atherosclerosis. Limited intracranial: Calcified atherosclerosis at the skull base. Stable and negative for age visible noncontrast brain parenchyma compared to 2014. IMPRESSION: 1. Minimal paranasal sinus mucosal thickening, limited to a left ethmoid air cell. No convincing acute sinusitis. 2. Symmetric but mild nasal cavity mucosal thickening, raising the possibility of mild rhinitis. 3. No other acute findings in the face. Calcified carotid atherosclerosis. Electronically Signed   By: Genevie Ann M.D.   On: 02/03/2020 16:28        Scheduled Meds: . albuterol  2.5 mg Nebulization TID  . apixaban  5 mg Oral BID  . atorvastatin  40 mg Oral q1800  . bisoprolol  5 mg Oral Daily  . diltiazem  360 mg Oral Daily  . escitalopram  5 mg Oral Daily  . furosemide  40 mg Oral Daily  . guaiFENesin  1,200 mg Oral BID  . loratadine  10 mg Oral Daily  . methylPREDNISolone (SOLU-MEDROL) injection  60 mg Intravenous Q6H  . mometasone-formoterol  2 puff Inhalation BID  . umeclidinium bromide  1 puff Inhalation Daily   Continuous Infusions:   LOS: 2 days     Georgette Shell, MD  02/04/2020, 9:11 AM

## 2020-02-05 MED ORDER — ALBUTEROL SULFATE (2.5 MG/3ML) 0.083% IN NEBU
2.5000 mg | INHALATION_SOLUTION | Freq: Two times a day (BID) | RESPIRATORY_TRACT | Status: DC
  Administered 2020-02-05 – 2020-02-07 (×4): 2.5 mg via RESPIRATORY_TRACT
  Filled 2020-02-05 (×4): qty 3

## 2020-02-05 NOTE — Progress Notes (Signed)
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 He was started on azithromycin as an outpatient. He reported his phlegm was green then turned yellow. He also complains of pleuritic chest pain. No nausea vomiting palpitations diarrhea loss of consciousness. No urinary complaints. No leg swelling. No fever or Covid exposure. He lives with his wife at home who is well.     ED Course:Patient was given Solu-Medrol and nebulizer. Review of Systems: As per HPI otherwise all other systems reviewed and are negative Sodium 136 potassium 3.5 BUN 13 creatinine 0.66 troponin was 4 BNP was 87 white count was 16.6 hemoglobin 13.3 platelet count 373. Covid negative. Blood cultures were drawn. Chest x-ray no active disease.  02/04/2020-patient ambulated in the hallway with drop in saturation to 85%.  He was placed on 2 L of oxygen and sats came up to 96 to 98%. CT of the chest and CT of the sinus done yesterday with no acute abnormalities.   Assessment & Plan:   Active Problems:   COPD with acute exacerbation ([place])    #1 acuteon chronichypoxic respiratory failure secondary to COPD exacerbation-he is admitted with productive cough and shortness of breath and hypoxemia worse than his baseline. he dropped his sats to 83 % on 2 lit last evening His wheezing has improved compared to yesterday I will taper Solu-Medrol continue nebulizer Mucinex and Claritin.   CT of the sinus-minimal paranasal sinus mucosal thickening limited to the left ethmoid air cells not convincing for acute sinusitis, symmetric but mild nasal cavity mucosal thickening raising the  concern for mild rhinitis no other acute findings.  Calcified carotid artery atherosclerosis.  CT of the chest-coronary artery calcification suggesting CAD, emphysema upper lobes bilaterally.  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 Encourage out of bed Ambulate 3 times a day  #2 chronic atrial fibrillation rate controlled on bisoprolol.  Continue Eliquis.    #3 hyperlipidemia on Lipitor  #4 depression on Lexapro  #5 muscle spasm continue muscle relaxant as he was taking home  Estimated body mass index is 25.5 kg/m as calculated from the following:   Height as of this encounter: 5\' 3"  (1.6 m).   Weight as of this encounter: 65.3 kg.  DVT prophylaxis:Eliquis  code Status:Full code Family Communication:Discussed with wife Disposition Plan:Patient coming from home plan is to discharge him to home. Barriers to discharge patient hypoxic on exertion on room air. Continues to have hypoxia and diffuse wheezing.   Consultants:  None  Procedures:None Antimicrobials:None   Subjective:staff reports patient dropped his sats to 83 percent with ambulation on 2 lit oxygen   Objective: Vitals:   02/04/20 2156 02/05/20 0527 02/05/20 1015 02/05/20 1121  BP:  (!) 147/64    Pulse:  75    Resp:  20    Temp:  97.8 F (36.6 C)    TempSrc:  Oral    SpO2: 91% 94% 90% 92%  Weight:      Height:        Intake/Output Summary (Last 24 hours) at 02/05/2020 1318 Last data filed at 02/04/2020 2200 Gross per 24 hour  Intake 120 ml  Output 200 ml  Net -80 ml   Filed Weights   02/02/20 1147  Weight: 65.3  kg    Examination:  General exam: Appears calm and comfortable  Respiratory system: wheezing b/lto auscultation. Respiratory effort normal. Cardiovascular system: S1 & S2 heard, RRR. No JVD, murmurs, rubs, gallops or clicks. No pedal edema. Gastrointestinal system: Abdomen is nondistended, soft and nontender. No organomegaly or masses felt. Normal bowel sounds  heard. Central nervous system: Alert and oriented. No focal neurological deficits. Extremities: no edema Skin: No rashes, lesions or ulcers Psychiatry: Judgement and insight appear normal. Mood & affect appropriate.     Data Reviewed: I have personally reviewed following labs and imaging studies  CBC: Recent Labs  Lab 02/02/20 1200 02/04/20 0440  WBC 16.6* 11.7*  NEUTROABS 14.7*  --   HGB 13.3 10.8*  HCT 43.1 34.6*  MCV 83.0 80.7  PLT 373 [id number]   Basic Metabolic Panel: Recent Labs  Lab 02/02/20 1200 02/04/20 0440  NA 136 139  K 3.5 3.7  CL 96* 102  CO2 29 29  GLUCOSE 199* 163*  BUN 13 24*  CREATININE 0.66 0.56*  CALCIUM 8.8* 8.2*   GFR: Estimated Creatinine Clearance: 65.2 mL/min (A) (by C-G formula based on SCr of 0.56 mg/dL (L)). Liver Function Tests: Recent Labs  Lab 02/04/20 0440  AST 15  ALT 31  ALKPHOS 70  BILITOT 0.5  PROT 6.0*  ALBUMIN 3.0*   No results for input(s): LIPASE, AMYLASE in the last 168 hours. No results for input(s): AMMONIA in the last 168 hours. Coagulation Profile: No results for input(s): INR, PROTIME in the last 168 hours. Cardiac Enzymes: No results for input(s): CKTOTAL, CKMB, CKMBINDEX, TROPONINI in the last 168 hours. BNP (last 3 results) No results for input(s): PROBNP in the last 8760 hours. HbA1C: No results for input(s): HGBA1C in the last 72 hours. CBG: No results for input(s): GLUCAP in the last 168 hours. Lipid Profile: No results for input(s): CHOL, HDL, LDLCALC, TRIG, CHOLHDL, LDLDIRECT in the last 72 hours. Thyroid Function Tests: No results for input(s): TSH, T4TOTAL, FREET4, T3FREE, THYROIDAB in the last 72 hours. Anemia Panel: No results for input(s): VITAMINB12, FOLATE, FERRITIN, TIBC, IRON, RETICCTPCT in the last 72 hours. Sepsis Labs: No results for input(s): PROCALCITON, LATICACIDVEN in the last 168 hours.  Recent Results (from the past 240 hour(s))  Culture, blood (routine x 2)     Status: None  (Preliminary result)   Collection Time: 02/02/20 12:01 PM   Specimen: BLOOD  Result Value Ref Range Status   Specimen Description   Final    BLOOD LEFT ANTECUBITAL Performed at [facility], [address] [place], [place] [address]    Special Requests   Final    BOTTLES DRAWN AEROBIC AND ANAEROBIC Blood Culture adequate volume Performed at [facility] [address] [place], [place] [address]    Culture   Final    NO GROWTH 2 DAYS Performed at [facility], [place] [address] [place], [place] [address]    Report Status PENDING  Incomplete  SARS Coronavirus 2 by RT PCR (hospital order, performed in [facility] hospital lab) Nasopharyngeal Nasopharyngeal Swab     Status: None   Collection Time: 02/02/20  1:11 PM   Specimen: Nasopharyngeal Swab  Result Value Ref Range Status   SARS Coronavirus 2 NEGATIVE NEGATIVE Final    Comment: (NOTE) SARS-CoV-2 target nucleic acids are NOT DETECTED. The SARS-CoV-2 RNA is generally detectable in upper and lower respiratory specimens during the acute phase of infection. The lowest concentration of SARS-CoV-2  viral copies this assay can detect is 250 copies / mL. A negative result does not preclude SARS-CoV-2 infection and should not be used as the sole basis for treatment or other patient management decisions.  A negative result may occur with improper specimen collection / handling, submission of specimen other than nasopharyngeal swab, presence of viral mutation(s) within the areas targeted by this assay, and inadequate number of viral copies (<250 copies / mL). A negative result must be combined with clinical observations, patient history, and epidemiological information. Fact Sheet for Patients:   StrictlyIdeas.no Fact Sheet for Healthcare Providers: BankingDealers.co.za This test is not yet approved or cleared  by the [place] FDA and has been authorized for  detection and/or diagnosis of SARS-CoV-2 by FDA under an Emergency Use Authorization (EUA).  This EUA will remain in effect (meaning this test can be used) for the duration of the COVID-19 declaration under Section 564(b)(1) of the Act, 21 U.S.C. section 360bbb-3(b)(1), unless the authorization is terminated or revoked sooner. Performed at [facility], [address] [place], [place] [address]   Culture, blood (routine x 2)     Status: None (Preliminary result)   Collection Time: 02/02/20  1:28 PM   Specimen: BLOOD  Result Value Ref Range Status   Specimen Description   Final    BLOOD BLOOD RIGHT HAND Performed at [facility], [address] [place], [place] [address]    Special Requests   Final    BOTTLES DRAWN AEROBIC ONLY Blood Culture results may not be optimal due to an inadequate volume of blood received in culture bottles Performed at [facility], [address] [place], [place] [address]    Culture   Final    NO GROWTH 2 DAYS Performed at [facility], [place] [address] [place], [place] [address]    Report Status PENDING  Incomplete         Radiology Studies: CT CHEST W CONTRAST  Result Date: 02/03/2020 CLINICAL DATA:  COPD exacerbation. EXAM: CT CHEST WITH CONTRAST TECHNIQUE: Multidetector CT imaging of the chest was performed during intravenous contrast administration. CONTRAST:  75mL OMNIPAQUE IOHEXOL 300 MG/ML  SOLN COMPARISON:  May 28, 2019. FINDINGS: Cardiovascular: Atherosclerosis of thoracic aorta is noted without aneurysm formation. Normal cardiac size. No pericardial effusion. Coronary artery calcifications are noted. Mediastinum/Nodes: No enlarged mediastinal, hilar, or axillary lymph nodes. Thyroid gland, trachea, and esophagus demonstrate no significant findings. Lungs/Pleura: No pneumothorax or pleural effusion is noted. Emphysematous disease is noted in the upper lobes bilaterally. Calcified biapical scarring is  noted. No acute consolidative process is noted. Upper Abdomen: No acute abnormality. Musculoskeletal: There is interval development of several old thoracic compression fractures in the mid thoracic spine since prior exam. No definite acute fracture is noted. IMPRESSION: 1. Coronary artery calcifications are noted suggesting coronary artery disease. 2. Emphysematous disease is noted in the upper lobes bilaterally. 3. Interval development of several old thoracic compression fractures since prior exam. No definite acute fracture is noted. Aortic Atherosclerosis (ICD10-I70.0). Emphysema (ICD10-J43.9). Electronically Signed   By: Marijo Conception M.D.   On: 02/03/2020 16:36   CT MAXILLOFACIAL WO CONTRAST  Result Date: 02/03/2020 CLINICAL DATA:  74 year old male with respiratory failure. COPD. Productive cough, shortness of breath. EXAM: CT MAXILLOFACIAL WITHOUT CONTRAST TECHNIQUE: Multidetector CT imaging of the maxillofacial structures was performed. Multiplanar CT image reconstructions were also generated. COMPARISON:  Head CT 06/17/2013. FINDINGS: Osseous: Absent dentition. No acute  osseous abnormality identified. Lower cervical spine disc and endplate degeneration. Posterior element ankylosis at C2-C3. Orbits: Intact orbital walls. Postoperative changes to both globes, otherwise negative noncontrast orbits soft tissues. Sinuses: Clear aside from isolated mild mucosal thickening or fluid in the left ethmoid air cell (coronal series 9, image 24). Symmetric but mild nasal cavity mucosal thickening, trace retained secretions in the right nasal cavity. Olfactory recesses are pneumatized. Nasal septum is intact. Bilateral tympanic cavities and mastoids are also well pneumatized. Soft tissues: Negative visible larynx, pharynx, parapharyngeal spaces, retropharyngeal space, sublingual space, submandibular and parotid glands. No upper cervical lymphadenopathy. Calcified bilateral carotid bifurcation atherosclerosis. Limited  intracranial: Calcified atherosclerosis at the skull base. Stable and negative for age visible noncontrast brain parenchyma compared to 2014. IMPRESSION: 1. Minimal paranasal sinus mucosal thickening, limited to a left ethmoid air cell. No convincing acute sinusitis. 2. Symmetric but mild nasal cavity mucosal thickening, raising the possibility of mild rhinitis. 3. No other acute findings in the face. Calcified carotid atherosclerosis. Electronically Signed   By: Genevie Ann M.D.   On: 02/03/2020 16:28        Scheduled Meds:  albuterol  2.5 mg Nebulization TID   apixaban  5 mg Oral BID   atorvastatin  40 mg Oral q1800   bisoprolol  5 mg Oral Daily   diltiazem  360 mg Oral Daily   escitalopram  5 mg Oral Daily   furosemide  40 mg Oral Daily   guaiFENesin  1,200 mg Oral BID   loratadine  10 mg Oral Daily   methylPREDNISolone (SOLU-MEDROL) injection  60 mg Intravenous Q6H   mometasone-formoterol  2 puff Inhalation BID   umeclidinium bromide  1 puff Inhalation Daily   Continuous Infusions:   LOS: 3 days      Georgette Shell, MD 02/05/2020, 1:18 PM

## 2020-02-06 DIAGNOSIS — M818 Other osteoporosis without current pathological fracture: Secondary | ICD-10-CM | POA: Insufficient documentation

## 2020-02-06 DIAGNOSIS — M8000XA Age-related osteoporosis with current pathological fracture, unspecified site, initial encounter for fracture: Secondary | ICD-10-CM | POA: Insufficient documentation

## 2020-02-06 LAB — CBC
HCT: 37.9 % — ABNORMAL LOW (ref 39.0–52.0)
Hemoglobin: 11.9 g/dL — ABNORMAL LOW (ref 13.0–17.0)
MCH: 25.2 pg — ABNORMAL LOW (ref 26.0–34.0)
MCHC: 31.4 g/dL (ref 30.0–36.0)
MCV: 80.3 fL (ref 80.0–100.0)
Platelets: 348 10*3/uL (ref 150–400)
RBC: 4.72 MIL/uL (ref 4.22–5.81)
RDW: 14.9 % (ref 11.5–15.5)
WBC: 11 10*3/uL — ABNORMAL HIGH (ref 4.0–10.5)
nRBC: 0 % (ref 0.0–0.2)

## 2020-02-06 MED ORDER — METHYLPREDNISOLONE SODIUM SUCC 125 MG IJ SOLR
60.0000 mg | Freq: Two times a day (BID) | INTRAMUSCULAR | Status: DC
  Administered 2020-02-06 – 2020-02-07 (×2): 60 mg via INTRAVENOUS
  Filled 2020-02-06 (×2): qty 2

## 2020-02-06 NOTE — Progress Notes (Signed)
Ambulated in hall with portable O2 @ 2L. Walked 300 ft. Half way stopped to rest, sat 93%. Upon return to room sat 94%. Exertional dyspnea noted yet no distress. Expressed that "The walk felt good."

## 2020-02-06 NOTE — Progress Notes (Signed)
NT ambulated pt in hall 300 ft with O2 @ 2L. Lowest sat mid walk 85%. Upon return to room sat 90%. Exertional dyspnea noted yet no distress.

## 2020-02-06 NOTE — Progress Notes (Signed)
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 He was started on azithromycin as an outpatient. He reported his phlegm was green then turned yellow. He also complains of pleuritic chest pain. No nausea vomiting palpitations diarrhea loss of consciousness. No urinary complaints. No leg swelling. No fever or Covid exposure. He lives with his wife at home who is well.     ED Course:Patient was given Solu-Medrol and nebulizer. Review of Systems: As per HPI otherwise all other systems reviewed and are negative Sodium 136 potassium 3.5 BUN 13 creatinine 0.66 troponin was 4 BNP was 87 white count was 16.6 hemoglobin 13.3 platelet count 373. Covid negative. Blood cultures were drawn. Chest x-ray no active disease.  2/14 staff reports patient dropped his sats to 85 %with ambulation in hall way with 2 lit on Assessment & Plan:   Active Problems:   COPD with acute exacerbation ([place])   #1 acuteon chronichypoxic respiratory failure secondary to COPD exacerbation-he is admitted with productive cough and shortness of breath and hypoxemia worse than his baseline. he dropped his sats to 83 % on 2 lit last evening His wheezing has improved compared to yesterday. Decrease Solu-Medrol to 60 Q 12.  Continue Mucinex Claritin and nebulizer.  CT of the sinus-minimal paranasal sinus mucosal thickening limited to the left ethmoid air cells not convincing for acute sinusitis, symmetric but mild nasal cavity mucosal thickening raising the concern for mild rhinitis no other acute findings. Calcified carotid artery atherosclerosis.  CT of the chest-coronary  artery calcification suggesting CAD, emphysema upper lobes bilaterally. 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 Encourage out of bed Ambulate 3 times a day  #2 chronic atrial fibrillationrate controlled on bisoprolol. Continue Eliquis.   #3 hyperlipidemia on Lipitor  #4 depression on Lexapro  #5 muscle spasm continue muscle relaxant as he was taking home    Estimated body mass index is 25.5 kg/m as calculated from the following:   Height as of this encounter: 5\' 3"  (1.6 m).   Weight as of this encounter: 65.3 kg.  DVT prophylaxis:Eliquis  code Status:Full code Family Communication:Discussed with wife Disposition Plan:Patient coming from home plan is to discharge him to home. Barriers to discharge patient hypoxic on exertion on room air. Continues to have hypoxia and diffuse wheezing.   Consultants:  None  Procedures:None Antimicrobials:None  Subjective: Still hypoxic with ambulation  Continues with wheezing though better but not back to baseline  Objective: Vitals:   02/06/20 0228 02/06/20 0823 02/06/20 1011 02/06/20 1034  BP: (!) 130/58  (!) 150/82   Pulse: 66     Resp: 20     Temp: 97.9 F (36.6 C)     TempSrc:      SpO2: 94% 94%  90%  Weight:      Height:        Intake/Output Summary (Last 24 hours) at 02/06/2020 1047 Last data filed at 02/06/2020 0224 Gross per 24 hour  Intake --  Output 475 ml  Net -475 ml   Filed Weights   02/02/20 1147  Weight: 65.3 kg    Examination:  General exam: Appears calm and comfortable  Respiratory system: wheezing diffuse  to auscultation. Respiratory effort normal. Cardiovascular system: S1 & S2 heard, RRR.  No JVD, murmurs, rubs, gallops or clicks. No pedal edema. Gastrointestinal system: Abdomen is nondistended, soft and nontender. No organomegaly or masses felt. Normal bowel sounds heard. Central nervous system: Alert and oriented. No focal neurological deficits. Extremities: Symmetric 5  x 5 power. Skin: No rashes, lesions or ulcers Psychiatry: Judgement and insight appear normal. Mood & affect appropriate.     Data Reviewed: I have personally reviewed following labs and imaging studies  CBC: Recent Labs  Lab 02/02/20 1200 02/04/20 0440 02/06/20 0636  WBC 16.6* 11.7* 11.0*  NEUTROABS 14.7*  --   --   HGB 13.3 10.8* 11.9*  HCT 43.1 34.6* 37.9*  MCV 83.0 80.7 80.3  PLT 373 302 0000000   Basic Metabolic Panel: Recent Labs  Lab 02/02/20 1200 02/04/20 0440  NA 136 139  K 3.5 3.7  CL 96* 102  CO2 29 29  GLUCOSE 199* 163*  BUN 13 24*  CREATININE 0.66 0.56*  CALCIUM 8.8* 8.2*   GFR: Estimated Creatinine Clearance: 65.2 mL/min (A) (by C-G formula based on SCr of 0.56 mg/dL (L)). Liver Function Tests: Recent Labs  Lab 02/04/20 0440  AST 15  ALT 31  ALKPHOS 70  BILITOT 0.5  PROT 6.0*  ALBUMIN 3.0*   No results for input(s): LIPASE, AMYLASE in the last 168 hours. No results for input(s): AMMONIA in the last 168 hours. Coagulation Profile: No results for input(s): INR, PROTIME in the last 168 hours. Cardiac Enzymes: No results for input(s): CKTOTAL, CKMB, CKMBINDEX, TROPONINI in the last 168 hours. BNP (last 3 results) No results for input(s): PROBNP in the last 8760 hours. HbA1C: No results for input(s): HGBA1C in the last 72 hours. CBG: No results for input(s): GLUCAP in the last 168 hours. Lipid Profile: No results for input(s): CHOL, HDL, LDLCALC, TRIG, CHOLHDL, LDLDIRECT in the last 72 hours. Thyroid Function Tests: No results for input(s): TSH, T4TOTAL, FREET4, T3FREE, THYROIDAB in the last 72 hours. Anemia Panel: No results for input(s): VITAMINB12, FOLATE, FERRITIN, TIBC, IRON, RETICCTPCT in the last 72 hours. Sepsis Labs: No results for input(s): PROCALCITON, LATICACIDVEN in the last 168 hours.  Recent Results (from the past 240 hour(s))  Culture, blood (routine x 2)     Status: None (Preliminary result)   Collection Time: 02/02/20 12:01  PM   Specimen: BLOOD  Result Value Ref Range Status   Specimen Description   Final    BLOOD LEFT ANTECUBITAL Performed at [facility], [address] [place], [place] [address]    Special Requests   Final    BOTTLES DRAWN AEROBIC AND ANAEROBIC Blood Culture adequate volume Performed at [facility], [address] [place], [place] [address]    Culture   Final    NO GROWTH 3 DAYS Performed at [facility], [place] [address] [place], [place] [address]    Report Status PENDING  Incomplete  SARS Coronavirus 2 by RT PCR (hospital order, performed in [facility] hospital lab) Nasopharyngeal Nasopharyngeal Swab     Status: None   Collection Time: 02/02/20  1:11 PM   Specimen: Nasopharyngeal Swab  Result Value Ref Range Status   SARS Coronavirus 2 NEGATIVE NEGATIVE Final    Comment: (NOTE) SARS-CoV-2 target nucleic acids are NOT DETECTED. The SARS-CoV-2 RNA is generally detectable in upper and lower respiratory specimens during the acute phase of infection. The lowest concentration of SARS-CoV-2 viral copies this assay can detect is 250 copies / mL. A negative result does  not preclude SARS-CoV-2 infection and should not be used as the sole basis for treatment or other patient management decisions.  A negative result may occur with improper specimen collection / handling, submission of specimen other than nasopharyngeal swab, presence of viral mutation(s) within the areas targeted by this assay, and inadequate number of viral copies (<250 copies / mL). A negative result must be combined with clinical observations, patient history, and epidemiological information. Fact Sheet for Patients:   StrictlyIdeas.no Fact Sheet for Healthcare Providers: BankingDealers.co.za This test is not yet approved or cleared  by the [place] FDA and has been authorized for detection and/or diagnosis of SARS-CoV-2 by FDA under an  Emergency Use Authorization (EUA).  This EUA will remain in effect (meaning this test can be used) for the duration of the COVID-19 declaration under Section 564(b)(1) of the Act, 21 U.S.C. section 360bbb-3(b)(1), unless the authorization is terminated or revoked sooner. Performed at [facility], [address] [place], [place] [address]   Culture, blood (routine x 2)     Status: None (Preliminary result)   Collection Time: 02/02/20  1:28 PM   Specimen: BLOOD  Result Value Ref Range Status   Specimen Description   Final    BLOOD BLOOD RIGHT HAND Performed at [facility], [address] [place], [place] [address]    Special Requests   Final    BOTTLES DRAWN AEROBIC ONLY Blood Culture results may not be optimal due to an inadequate volume of blood received in culture bottles Performed at [facility], [address] [place], [place] [address]    Culture   Final    NO GROWTH 3 DAYS Performed at [facility], [place] [address], [place], [place] [address]    Report Status PENDING  Incomplete         Radiology Studies: No results found.      Scheduled Meds: . albuterol  2.5 mg Nebulization BID  . apixaban  5 mg Oral BID  . atorvastatin  40 mg Oral q1800  . bisoprolol  5 mg Oral Daily  . diltiazem  360 mg Oral Daily  . escitalopram  5 mg Oral Daily  . furosemide  40 mg Oral Daily  . guaiFENesin  1,200 mg Oral BID  . loratadine  10 mg Oral Daily  . methylPREDNISolone (SOLU-MEDROL) injection  60 mg Intravenous Q6H  . mometasone-formoterol  2 puff Inhalation BID  . umeclidinium bromide  1 puff Inhalation Daily   Continuous Infusions:   LOS: 4 days     Georgette Shell, MD 02/06/2020, 10:47 AM

## 2020-02-06 NOTE — Assessment & Plan Note (Signed)
Prolia ordered

## 2020-02-07 LAB — CULTURE, BLOOD (ROUTINE X 2)
Culture: NO GROWTH
Culture: NO GROWTH
Special Requests: ADEQUATE

## 2020-02-07 MED ORDER — PREDNISONE 10 MG PO TABS: ORAL_TABLET | ORAL | 2 refills | Status: DC

## 2020-02-07 MED ORDER — TEMAZEPAM 15 MG PO CAPS: 15.0000 mg | ORAL_CAPSULE | Freq: Every evening | ORAL | 0 refills | Status: DC | PRN

## 2020-02-07 NOTE — Discharge Summary (Addendum)
Physician Discharge Summary  Paul [name] [id number] DOB: [birth year] DOA: 02/02/2020  PCP: [name], MD  Admit date: 02/02/2020 Discharge date: 02/07/2020  Admitted From: Home  disposition: Home Recommendations for Outpatient Follow-up:  1. Follow up with PCP in 1-2 weeks 2. Please obtain BMP/CBC in one week 3. Please follow up with Dr. 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 He was started on azithromycin as an outpatient. He reported his phlegm was green then turned yellow. He also complains of pleuritic chest pain. No nausea vomiting palpitations diarrhea loss of consciousness. No urinary complaints. No leg swelling. No fever or Covid exposure. He lives with his wife at home who is well.     ED Course:Patient was given Solu-Medrol and nebulizer. Review of Systems: As per HPI otherwise all other systems reviewed and are negative Sodium 136 potassium 3.5 BUN 13 creatinine 0.66 troponin was 4 BNP was 87 white count was 16.6 hemoglobin 13.3 platelet count 373. Covid negative. Blood cultures were drawn. Chest x-ray no active disease.  Assessment & Plan:  Discharge Diagnoses:  Active Problems:   COPD with acute exacerbation ([place])  #1 acuteon chronichypoxic respiratory failure secondary to COPD exacerbation-he was admitted with productive cough and shortness of breath and hypoxemia worse than his baseline. He was able to maintain his saturations above 90 % on 2 lit prior to dc.  Continue Mucinex Claritin  and nebulizer.will dc home today on prednisone taper and to stay at 20 mg daily till seen by pulmonologist.  CT of the sinus-minimal paranasal sinus mucosal thickening limited to the left ethmoid air cells not convincing for acute sinusitis, symmetric but mild nasal cavity mucosal thickening raising the concern for mild rhinitis no other acute findings. Calcified carotid artery atherosclerosis.  CT of the chest-coronary artery calcification suggesting CAD, emphysema upper lobes bilaterally. 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  #2 chronic atrial fibrillationrate controlled on bisoprolol. Continue Eliquis.   #3 hyperlipidemia on Lipitor  #4 depression on Lexapro  #5 muscle spasm continue muscle relaxant as he was taking home  #6 insomnia patient received Restoril during the hospital stay and he requested Restoril prescriptions.  Since he was already taking lorazepam at home I have did not give him a prescription for Restoril.   Estimated body mass index is 25.5 kg/m as calculated from the following:   Height as of this encounter: 5\' 3"  (1.6 m).   Weight as of this encounter: 65.3 kg.  Discharge Instructions  Discharge Instructions    Call MD for:  difficulty breathing, headache or visual disturbances   Complete by: As directed    Call MD for:  persistant nausea and vomiting   Complete by: As directed    Call MD for:  redness, tenderness, or signs of infection (pain, swelling, redness, odor or green/yellow discharge around incision site)   Complete by: As directed    Call MD for:  severe uncontrolled pain   Complete by: As directed    Diet - low sodium heart healthy   Complete by: As directed    Increase activity slowly   Complete by: As directed      Allergies  as of 02/07/2020   No Known Allergies     Medication List    STOP taking these medications   azithromycin 250 MG tablet Commonly known as: Zithromax Z-Pak   Trelegy Ellipta 100-62.5-25 MCG/INH  Aepb Generic drug: Fluticasone-Umeclidin-Vilant     TAKE these medications   ProAir HFA 108 (90 Base) MCG/ACT inhaler Generic drug: albuterol Inhale 2 puffs into the lungs every 6 (six) hours as needed for wheezing or shortness of breath.   albuterol (2.5 MG/3ML) 0.083% nebulizer solution Commonly known as: PROVENTIL Take 3 mLs (2.5 mg total) by nebulization every 6 (six) hours as needed for wheezing or shortness of breath.   apixaban 5 MG Tabs tablet Commonly known as: ELIQUIS Take 1 tablet (5 mg total) by mouth 2 (two) times daily.   atorvastatin 40 MG tablet Commonly known as: LIPITOR TAKE 1 TABLET(40 MG) BY MOUTH DAILY AFTER SUPPER What changed:   how much to take  how to take this  when to take this  additional instructions   bisoprolol 5 MG tablet Commonly known as: ZEBETA One daily What changed:   how much to take  how to take this  when to take this   budesonide-formoterol 160-4.5 MCG/ACT inhaler Commonly known as: SYMBICORT Inhale 2 puffs into the lungs 2 (two) times daily.   calcitonin (salmon) 200 UNIT/ACT nasal spray Commonly known as: MIACALCIN/FORTICAL Place 1 spray into alternate nostrils daily.   Calcium 600+D Plus Minerals 600-400 MG-UNIT Tabs Take 1 tablet by mouth 2 (two) times daily.   diltiazem 360 MG 24 hr capsule Commonly known as: Cardizem CD Take 1 capsule (360 mg total) by mouth daily.   escitalopram 5 MG tablet Commonly known as: Lexapro Take 1 tablet (5 mg total) by mouth daily.   furosemide 40 MG tablet Commonly known as: LASIX Take 1 tablet (40 mg total) by mouth daily.   guaiFENesin 600 MG 12 hr tablet Commonly known as: MUCINEX Take 2 tablets (1,200 mg total) by mouth 2 (two) times daily as needed. What changed: when to take this   loratadine 10 MG tablet Commonly known as: CLARITIN Take 10 mg by mouth daily.   LORazepam 0.5 MG tablet Commonly known as: ATIVAN Take 1 tablet (0.5 mg total) by mouth daily as  needed for anxiety.   Melatonin 10 MG Tabs Take 1 tablet by mouth at bedtime.   omeprazole 20 MG capsule Commonly known as: PRILOSEC Take 20 mg by mouth 2 (two) times daily before a meal.   OXYGEN Inhale 2 L into the lungs continuous. With sleep only  Adapt   predniSONE 10 MG tablet Commonly known as: DELTASONE Take 40 mg that is 4 tablets daily for 4 days Then 30 mg that is 3 tablets daily for 4 days Then 20 mg daily and stay on 20 mg daily What changed:   medication strength  additional instructions   Spiriva Respimat 2.5 MCG/ACT Aers Generic drug: Tiotropium Bromide Monohydrate Inhale 2.5 mcg into the lungs daily.   temazepam 15 MG capsule Commonly known as: RESTORIL Take 1 capsule (15 mg total) by mouth at bedtime as needed for sleep.   tiZANidine 2 MG tablet Commonly known as: ZANAFLEX Take 1 tablet (2 mg total) by mouth every 6 (six) hours as needed for muscle spasms.   Vitamin C 500 MG Caps Take 1 capsule by mouth daily.   Vitamin D 50 MCG (2000 UT) tablet Take 2,000 Units by mouth daily.      Follow-up Information  [name], MD Follow up.   Specialties: Family Medicine, Interventional Cardiology, Radiology, Anesthesiology Contact information: [place] [address] [phone]        Tanda Rockers, MD Follow up.   Specialty: Pulmonary Disease Contact information: [place] [address] [phone]          No Known Allergies  Consultations:  none   Procedures/Studies: DG Thoracic Spine 2 View  Result Date: 01/10/2020 CLINICAL DATA:  Back pain since coughing spell yesterday. EXAM: THORACIC SPINE 2 VIEWS COMPARISON:  June 17, 2013 FINDINGS: A significant compression fracture of T8 is unchanged since August 16, 2019. A compression fracture T7 also appears similar/stable in the interval. Anterior wedging of T5 appears increased since August of 2020 and January 06, 2020. Mild  concavity of the superior endplate of QA348G is stable since August of 2020. No other interval changes. IMPRESSION: 1. There appears to be increased anterior wedging of T5 since January 06, 2020 concerning for a compression fracture with an acute component. Other compression fractures are stable. These results will be called to the ordering clinician or representative by the Radiologist Assistant, and communication documented in the PACS or zVision Dashboard. Electronically Signed   By: Dorise Bullion III M.D   On: 01/10/2020 11:15   CT CHEST W CONTRAST  Result Date: 02/03/2020 CLINICAL DATA:  COPD exacerbation. EXAM: CT CHEST WITH CONTRAST TECHNIQUE: Multidetector CT imaging of the chest was performed during intravenous contrast administration. CONTRAST:  77mL OMNIPAQUE IOHEXOL 300 MG/ML  SOLN COMPARISON:  May 28, 2019. FINDINGS: Cardiovascular: Atherosclerosis of thoracic aorta is noted without aneurysm formation. Normal cardiac size. No pericardial effusion. Coronary artery calcifications are noted. Mediastinum/Nodes: No enlarged mediastinal, hilar, or axillary lymph nodes. Thyroid gland, trachea, and esophagus demonstrate no significant findings. Lungs/Pleura: No pneumothorax or pleural effusion is noted. Emphysematous disease is noted in the upper lobes bilaterally. Calcified biapical scarring is noted. No acute consolidative process is noted. Upper Abdomen: No acute abnormality. Musculoskeletal: There is interval development of several old thoracic compression fractures in the mid thoracic spine since prior exam. No definite acute fracture is noted. IMPRESSION: 1. Coronary artery calcifications are noted suggesting coronary artery disease. 2. Emphysematous disease is noted in the upper lobes bilaterally. 3. Interval development of several old thoracic compression fractures since prior exam. No definite acute fracture is noted. Aortic Atherosclerosis (ICD10-I70.0). Emphysema (ICD10-J43.9). Electronically  Signed   By: Marijo Conception M.D.   On: 02/03/2020 16:36   DG Chest Port 1 View  Result Date: 02/02/2020 CLINICAL DATA:  Cough, COPD EXAM: PORTABLE CHEST 1 VIEW COMPARISON:  01/06/2020 FINDINGS: The lungs are hyperinflated likely secondary to COPD. There is no focal consolidation. There is no pleural effusion or pneumothorax. The heart and mediastinal contours are unremarkable. There is no acute osseous abnormality. IMPRESSION: No active disease. Electronically Signed   By: Kathreen Devoid   On: 02/02/2020 13:10   CT MAXILLOFACIAL WO CONTRAST  Result Date: 02/03/2020 CLINICAL DATA:  74 year old male with respiratory failure. COPD. Productive cough, shortness of breath. EXAM: CT MAXILLOFACIAL WITHOUT CONTRAST TECHNIQUE: Multidetector CT imaging of the maxillofacial structures was performed. Multiplanar CT image reconstructions were also generated. COMPARISON:  Head CT 06/17/2013. FINDINGS: Osseous: Absent dentition. No acute osseous abnormality identified. Lower cervical spine disc and endplate degeneration. Posterior element ankylosis at C2-C3. Orbits: Intact orbital walls. Postoperative changes to both globes, otherwise negative noncontrast orbits soft tissues. Sinuses: Clear aside from isolated mild  mucosal thickening or fluid in the left ethmoid air cell (coronal series 9, image 24). Symmetric but mild nasal cavity mucosal thickening, trace retained secretions in the right nasal cavity. Olfactory recesses are pneumatized. Nasal septum is intact. Bilateral tympanic cavities and mastoids are also well pneumatized. Soft tissues: Negative visible larynx, pharynx, parapharyngeal spaces, retropharyngeal space, sublingual space, submandibular and parotid glands. No upper cervical lymphadenopathy. Calcified bilateral carotid bifurcation atherosclerosis. Limited intracranial: Calcified atherosclerosis at the skull base. Stable and negative for age visible noncontrast brain parenchyma compared to 2014. IMPRESSION: 1.  Minimal paranasal sinus mucosal thickening, limited to a left ethmoid air cell. No convincing acute sinusitis. 2. Symmetric but mild nasal cavity mucosal thickening, raising the possibility of mild rhinitis. 3. No other acute findings in the face. Calcified carotid atherosclerosis. Electronically Signed   By: Genevie Ann M.D.   On: 02/03/2020 16:28    (Echo, Carotid, EGD, Colonoscopy, ERCP)    Subjective: Patient resting in bed feels he is back to his baseline and anxious to go home ambulated yesterday with the staff without dropping his saturation.  Discharge Exam: Vitals:   02/07/20 0807 02/07/20 0811  BP:    Pulse:    Resp:    Temp:    SpO2: 92% 92%   Vitals:   02/06/20 2028 02/07/20 0454 02/07/20 0807 02/07/20 0811  BP: (!) 142/64 140/67    Pulse: 72 76    Resp: 15 16    Temp: 98.1 F (36.7 C) 98.3 F (36.8 C)    TempSrc: Oral Oral    SpO2: 97% 95% 92% 92%  Weight:      Height:        General: Pt is alert, awake, not in acute distress Cardiovascular: RRR, S1/S2 +, no rubs, no gallops Respiratory: Wheezing but decreased bilaterally, no wheezing, no rhonchi Abdominal: Soft, NT, ND, bowel sounds + Extremities: no edema, no cyanosis    The results of significant diagnostics from this hospitalization (including imaging, microbiology, ancillary and laboratory) are listed below for reference.     Microbiology: Recent Results (from the past 240 hour(s))  Culture, blood (routine x 2)     Status: None   Collection Time: 02/02/20 12:01 PM   Specimen: BLOOD  Result Value Ref Range Status   Specimen Description   Final    BLOOD LEFT ANTECUBITAL Performed at [facility], [address] [place], [place] [address]    Special Requests   Final    BOTTLES DRAWN AEROBIC AND ANAEROBIC Blood Culture adequate volume Performed at [facility], [address] [place], [place] [address]    Culture   Final    NO GROWTH 5 DAYS Performed at [place], [place] [address] [place], [place] [address]    Report Status 02/07/2020 FINAL  Final  SARS Coronavirus 2 by RT PCR (hospital order, performed in [facility] hospital lab) Nasopharyngeal Nasopharyngeal Swab     Status: None   Collection Time: 02/02/20  1:11 PM   Specimen: Nasopharyngeal Swab  Result Value Ref Range Status   SARS Coronavirus 2 NEGATIVE NEGATIVE Final    Comment: (NOTE) SARS-CoV-2 target nucleic acids are NOT DETECTED. The SARS-CoV-2 RNA is generally detectable in upper and lower respiratory specimens during the acute phase of infection. The lowest concentration of SARS-CoV-2 viral copies this assay can detect is 250 copies / mL. A negative result does not preclude SARS-CoV-2 infection and should not be used as the sole  basis for treatment or other patient management decisions.  A negative result may occur with improper specimen collection / handling, submission of specimen other than nasopharyngeal swab, presence of viral mutation(s) within the areas targeted by this assay, and inadequate number of viral copies (<250 copies / mL). A negative result must be combined with clinical observations, patient history, and epidemiological information. Fact Sheet for Patients:   StrictlyIdeas.no Fact Sheet for Healthcare Providers: BankingDealers.co.za This test is not yet approved or cleared  by the [place] FDA and has been authorized for detection and/or diagnosis of SARS-CoV-2 by FDA under an Emergency Use Authorization (EUA).  This EUA will remain in effect (meaning this test can be used) for the duration of the COVID-19 declaration under Section 564(b)(1) of the Act, 21 U.S.C. section 360bbb-3(b)(1), unless the authorization is terminated or revoked sooner. Performed at [facility], [address] [place], [place] [address]   Culture, blood (routine x 2)     Status: None   Collection Time: 02/02/20  1:28 PM    Specimen: BLOOD  Result Value Ref Range Status   Specimen Description   Final    BLOOD BLOOD RIGHT HAND Performed at [facility], [address] [place], [place] [address]    Special Requests   Final    BOTTLES DRAWN AEROBIC ONLY Blood Culture results may not be optimal due to an inadequate volume of blood received in culture bottles Performed at [facility], [address] [place], [place] [address]    Culture   Final    NO GROWTH 5 DAYS Performed at [facility], [place] [address], [place], [place] [address]    Report Status 02/07/2020 FINAL  Final     Labs: BNP (last 3 results) Recent Labs    05/03/19 1324 05/28/19 1724 02/02/20 1200  BNP 58.4 56.6 [id number]   Basic Metabolic Panel: Recent Labs  Lab 02/02/20 1200 02/04/20 0440  NA 136 139  K 3.5 3.7  CL 96* 102  CO2 29 29  GLUCOSE 199* 163*  BUN 13 24*  CREATININE 0.66 0.56*  CALCIUM 8.8* 8.2*   Liver Function Tests: Recent Labs  Lab 02/04/20 0440  AST 15  ALT 31  ALKPHOS 70  BILITOT 0.5  PROT 6.0*  ALBUMIN 3.0*   No results for input(s): LIPASE, AMYLASE in the last 168 hours. No results for input(s): AMMONIA in the last 168 hours. CBC: Recent Labs  Lab 02/02/20 1200 02/04/20 0440 02/06/20 0636  WBC 16.6* 11.7* 11.0*  NEUTROABS 14.7*  --   --   HGB 13.3 10.8* 11.9*  HCT 43.1 34.6* 37.9*  MCV 83.0 80.7 80.3  PLT 373 302 348   Cardiac Enzymes: No results for input(s): CKTOTAL, CKMB, CKMBINDEX, TROPONINI in the last 168 hours. BNP: Invalid input(s): POCBNP CBG: No results for input(s): GLUCAP in the last 168 hours. D-Dimer No results for input(s): DDIMER in the last 72 hours. Hgb A1c No results for input(s): HGBA1C in the last 72 hours. Lipid Profile No results for input(s): CHOL, HDL, LDLCALC, TRIG, CHOLHDL, LDLDIRECT in the last 72 hours. Thyroid function studies No results for input(s): TSH, T4TOTAL, T3FREE, THYROIDAB in the last 72 hours.  Invalid  input(s): FREET3 Anemia work up No results for input(s): VITAMINB12, FOLATE, FERRITIN, TIBC, IRON, RETICCTPCT in the last 72 hours. Urinalysis    Component Value Date/Time   COLORURINE YELLOW 03/03/2019 1325   APPEARANCEUR CLEAR 03/03/2019 1325  LABSPEC <1.005 (L) 03/03/2019 1325   PHURINE 6.5 03/03/2019 1325   GLUCOSEU NEGATIVE 03/03/2019 1325   HGBUR NEGATIVE 03/03/2019 1325   BILIRUBINUR NEGATIVE 03/03/2019 [place] 03/03/2019 1325   PROTEINUR NEGATIVE 03/03/2019 1325   NITRITE NEGATIVE 03/03/2019 1325   LEUKOCYTESUR NEGATIVE 03/03/2019 1325   Sepsis Labs Invalid input(s): PROCALCITONIN,  WBC,  LACTICIDVEN Microbiology Recent Results (from the past 240 hour(s))  Culture, blood (routine x 2)     Status: None   Collection Time: 02/02/20 12:01 PM   Specimen: BLOOD  Result Value Ref Range Status   Specimen Description   Final    BLOOD LEFT ANTECUBITAL Performed at [facility], [address] [place], [place] [address]    Special Requests   Final    BOTTLES DRAWN AEROBIC AND ANAEROBIC Blood Culture adequate volume Performed at [facility], [address] [place], [place] [address]    Culture   Final    NO GROWTH 5 DAYS Performed at [facility], [place] [address] [place], [place] [address]    Report Status 02/07/2020 FINAL  Final  SARS Coronavirus 2 by RT PCR (hospital order, performed in [facility] hospital lab) Nasopharyngeal Nasopharyngeal Swab     Status: None   Collection Time: 02/02/20  1:11 PM   Specimen: Nasopharyngeal Swab  Result Value Ref Range Status   SARS Coronavirus 2 NEGATIVE NEGATIVE Final    Comment: (NOTE) SARS-CoV-2 target nucleic acids are NOT DETECTED. The SARS-CoV-2 RNA is generally detectable in upper and lower respiratory specimens during the acute phase of infection. The lowest concentration of SARS-CoV-2 viral copies this assay can detect is 250 copies / mL. A negative result does not preclude  SARS-CoV-2 infection and should not be used as the sole basis for treatment or other patient management decisions.  A negative result may occur with improper specimen collection / handling, submission of specimen other than nasopharyngeal swab, presence of viral mutation(s) within the areas targeted by this assay, and inadequate number of viral copies (<250 copies / mL). A negative result must be combined with clinical observations, patient history, and epidemiological information. Fact Sheet for Patients:   StrictlyIdeas.no Fact Sheet for Healthcare Providers: BankingDealers.co.za This test is not yet approved or cleared  by the [place] FDA and has been authorized for detection and/or diagnosis of SARS-CoV-2 by FDA under an Emergency Use Authorization (EUA).  This EUA will remain in effect (meaning this test can be used) for the duration of the COVID-19 declaration under Section 564(b)(1) of the Act, 21 U.S.C. section 360bbb-3(b)(1), unless the authorization is terminated or revoked sooner. Performed at [facility], [address] [place], [place] [address]   Culture, blood (routine x 2)     Status: None   Collection Time: 02/02/20  1:28 PM   Specimen: BLOOD  Result Value Ref Range Status   Specimen Description   Final    BLOOD BLOOD RIGHT HAND Performed at [facility], [address] [place], [place] [address]    Special Requests   Final    BOTTLES DRAWN AEROBIC ONLY Blood Culture results may not be optimal due to an inadequate volume of blood received in culture bottles Performed at [facility], [address] [place], [place] [address]    Culture   Final    NO GROWTH 5 DAYS Performed at [facility], [place]  [address], [place], [place] [address]    Report Status 02/07/2020 FINAL  Final     Time coordinating discharge:  39 minutes  SIGNED:   Georgette Shell, MD  [facility] 02/07/2020, 9:15 AM Pager   If 7PM-7AM, please contact night-coverage [email or web address] Password [email or web address]

## 2020-02-07 NOTE — TOC Transition Note (Signed)
Transition of Care [facility]) - CM/SW Discharge Note   Patient Details  Name: Paul [name] MRN: [id number] Date of Birth: 10/28/1946  [facility] CM/SW Contact:  Trish Mage, LCSW Phone Number: 02/07/2020, 10:20 AM   Clinical Narrative:  Patient is d/cing home today.  Has O2 through [employer], and wife will bring O2 tank from home when she comes to pick him up.  No further needs identified.  TOC sign off.    Final next level of care: Home/Self Care Barriers to Discharge: No Barriers Identified   Patient Goals and CMS Choice        Discharge Placement                       Discharge Plan and Services                                     Social Determinants of Health (SDOH) Interventions     Readmission Risk Interventions Readmission Risk Prevention Plan 06/01/2019 05/06/2019  Transportation Screening Complete Complete  PCP or Specialist Appt within 3-5 Days Not Complete Complete  Not Complete comments not ready for discharge  -  [place] or [place] Complete Complete  Social Work Consult for [place] Planning/Counseling Complete Complete  Palliative Care Screening Not Applicable Complete  Medication Review Press photographer) Complete Complete  Some recent data might be hidden

## 2020-02-07 NOTE — Progress Notes (Signed)
Went over discharge instructions w/ patient. Pt verbalized understanding.  

## 2020-02-07 NOTE — Progress Notes (Addendum)
Sharrie Rothman from [facility] called and stated that patient just picked up a 30 day supply for lorazepam on 02/01/2020. She wanted to clarify if MD wanted to fill the temazepam prescription sent in today. Secure chat sent to Dr. Rodena Piety.   Spoke w/ Dr. Rodena Piety she cancelled the temazepam order.   Notified [employer].

## 2020-02-07 NOTE — Care Management Important Message (Signed)
Important Message  Patient Details IM Letter given to Roque Lias SW Case Manager to present to the Patient Name: Gearald Bockus MRN: [id number] Date of Birth: 12/28/1945   Medicare Important Message Given:  Yes     Kerin Salen 02/07/2020, 10:46 AM

## 2020-02-09 ENCOUNTER — Other Ambulatory Visit: Payer: Medicare Other

## 2020-02-15 ENCOUNTER — Other Ambulatory Visit: Payer: Self-pay

## 2020-02-15 ENCOUNTER — Encounter: Payer: Self-pay | Admitting: Family Medicine

## 2020-02-15 ENCOUNTER — Ambulatory Visit (INDEPENDENT_AMBULATORY_CARE_PROVIDER_SITE_OTHER): Payer: Medicare Other | Admitting: Family Medicine

## 2020-02-15 VITALS — BP 124/50 | HR 79 | Temp 98.4°F | Resp 20 | Ht 65.0 in | Wt 140.0 lb

## 2020-02-15 DIAGNOSIS — M8000XD Age-related osteoporosis with current pathological fracture, unspecified site, subsequent encounter for fracture with routine healing: Secondary | ICD-10-CM

## 2020-02-15 DIAGNOSIS — J441 Chronic obstructive pulmonary disease with (acute) exacerbation: Secondary | ICD-10-CM | POA: Diagnosis not present

## 2020-02-15 DIAGNOSIS — J9621 Acute and chronic respiratory failure with hypoxia: Secondary | ICD-10-CM | POA: Diagnosis not present

## 2020-02-15 MED ORDER — DALIRESP 250 MCG PO TABS: 1.0000 | ORAL_TABLET | Freq: Every day | ORAL | 3 refills | Status: DC

## 2020-02-15 NOTE — Patient Instructions (Signed)
Acute on chronic respiratory failure with hypoxia (HCC) Persistent.  Patient is on continuous oxygen with the exception of if he is at rest and awake.  He was told he could remove the oxygen in this situation.  Strongly recommended patient keep follow-up with Dr. Melvyn Novas which he intends to do.  I did start him on Daliresp once daily in the hopes of decreasing future exacerbations and hospitalizations.  COPD with acute exacerbation ([place]) Samples of Spiriva given.  Continue current medications.  Start Daliresp once daily. Keep appointment with Dr. Melvyn Novas.  Osteoporosis with current pathological fracture Patient Prolia was ordered/prior authorized.  We have not heard back. We will determine coverage for him in the next 24 to 48 hours and order his Prolia unless we have some.  I have prioritized him to the top of her Prolia list because he is at such high risk for recurrent fractures being on chronic prednisone treatment.

## 2020-02-15 NOTE — Assessment & Plan Note (Addendum)
Samples of Spiriva given.  Continue current medications.  Start Daliresp once daily. Keep appointment with Dr. Melvyn Novas.

## 2020-02-15 NOTE — Assessment & Plan Note (Signed)
Patient Prolia was ordered/prior authorized.  We have not heard back. We will determine coverage for him in the next 24 to 48 hours and order his Prolia unless we have some.  I have prioritized him to the top of her Prolia list because he is at such high risk for recurrent fractures being on chronic prednisone treatment.

## 2020-02-15 NOTE — Progress Notes (Signed)
Subjective:  Patient ID: Paul [name], male    DOB: 17-Mar-1946  Age: 74 y.o. MRN: [id number]  Chief Complaint  Patient presents with  . Hospitalization Follow-up    HPI Patient is a 74 year old white male with end-stage COPD who presents in follow-up from his hospitalization to [facility] for acute respiratory failure with hypoxia due to COPD exacerbation.  Patient was admitted on February 02, 2020 and discharged on February 07, 2020.  Upon admission his chest x-ray showed no pneumonia but was concerning for COPD.  CBC showed elevated WBCs to 16,000, liver and kidney function were normal.  COVID-19 was negative.  His baseline oxygen at home is on 2 L.  He did require increasing oxygen while admitted.  He received IV Solu-Medrol and nebulizer treatments.  The patient completed his azithromycin while he was in the hospital.  He has been on chronic prednisone and is tapering down.  He is currently on 30 mg of prednisone daily.  This is being managed by Dr. Shyrl Numbers with whom he has a follow-up next week.   Social History   Socioeconomic History  . Marital status: Married    Spouse name: Not on file  . Number of children: 3  . Years of education: Not on file  . Highest education level: Not on file  Occupational History  . Occupation: retired    Comment: truck Geophysicist/field seismologist  Tobacco Use  . Smoking status: Former Smoker    Packs/day: 2.00    Years: 52.00    Pack years: 104.00    Types: Cigarettes    Quit date: 02/03/2009    Years since quitting: 11.0  . Smokeless tobacco: Never Used  . Tobacco comment: Counseled to remain smoke free  Substance and Sexual Activity  . Alcohol use: Yes    Alcohol/week: 0.0 standard drinks    Comment: occ  . Drug use: No  . Sexual activity: Not on file  Other Topics Concern  . Not on file  Social History Narrative   Originally from [place]. Previously worked driving a Restaurant manager, fast food. No pets currently. No mold exposure. 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       wears sunscreen, brushes and flosses daily, see's dentist bi-annually, has smoke/carbon monoxide detectors, wears a seatbelt and practices gun safety      Social Determinants of Health   Financial Resource Strain:   . Difficulty of Paying Living Expenses: Not on file  Food Insecurity:   . Worried About Charity fundraiser in the Last Year: Not on file  . Ran Out of Food in the Last Year: Not on file  Transportation Needs:   . Lack of Transportation (Medical): Not on file  . Lack of Transportation (Non-Medical): Not on file  Physical Activity: Inactive  . Days of Exercise per Week: 0 days  . Minutes of Exercise per Session: 0 min  Stress:   . Feeling of Stress : Not on file  Social Connections:   . Frequency of Communication with Friends and Family: Not on file  . Frequency of Social Gatherings with Friends and Family: Not on file  . Attends Religious Services: Not on file  . Active Member of Clubs or Organizations: Not on file  . Attends Archivist Meetings: Not on file  . Marital Status: Not on file   Past Medical History:  Diagnosis Date  . A-fib ([place])   .  Anemia   . Atrial fibrillation ([place])   . Back pain   . COPD (chronic obstructive pulmonary disease) ([place])   . COPD (chronic obstructive pulmonary disease) ([place])   . Elevated PSA   . GAD (generalized anxiety disorder)   . High cholesterol   . Hypertension   . Impaired fasting glucose   . Osteoporosis    Family History  Problem Relation Age of Onset  . Heart disease Brother   . Heart disease Mother   . Heart disease Father   . Cancer Paternal Uncle     Review of Systems  Constitutional: Positive for fatigue. Negative for chills and fever.  HENT: Negative for congestion, ear pain and sore throat.   Respiratory: Positive for cough and shortness of breath.   Cardiovascular: Negative for chest pain.  Gastrointestinal: Negative for abdominal  pain, constipation, diarrhea, nausea and vomiting.  Musculoskeletal: Negative for arthralgias and myalgias.  Neurological: Negative for dizziness and headaches.  Psychiatric/Behavioral: Negative for dysphoric mood.       No dysphoria     Objective:  BP (!) 124/50   Pulse 79   Temp 98.4 F (36.9 C) (Temporal)   Resp 20   Wt 140 lb (63.5 kg)   SpO2 92%   BMI 24.80 kg/m   BP/Weight 02/15/2020 02/07/2020 [id number]  Systolic BP [id number] -  Diastolic BP 50 67 -  Wt. (Lbs) 140 - 143.96  BMI 24.8 - 25.5    Physical Exam Vitals reviewed.  Constitutional:      Appearance: Normal appearance.     Comments: [name] appears tired. He is wearig oxygen at 2 L.   Neck:     Vascular: No carotid bruit.  Cardiovascular:     Rate and Rhythm: Normal rate and regular rhythm.     Pulses: Normal pulses.     Heart sounds: Normal heart sounds.  Pulmonary:     Effort: Pulmonary effort is normal.     Breath sounds: Normal breath sounds. No wheezing, rhonchi or rales.  Abdominal:     General: Bowel sounds are normal.     Palpations: Abdomen is soft.     Tenderness: There is no abdominal tenderness.  Skin:    Comments: Scab on end of nose.  Neurological:     Mental Status: He is alert.  Psychiatric:        Mood and Affect: Mood normal.        Behavior: Behavior normal.     Lab Results  Component Value Date   WBC 11.0 (H) 02/06/2020   HGB 11.9 (L) 02/06/2020   HCT 37.9 (L) 02/06/2020   PLT 348 02/06/2020   GLUCOSE 163 (H) 02/04/2020   ALT 31 02/04/2020   AST 15 02/04/2020   NA 139 02/04/2020   K 3.7 02/04/2020   CL 102 02/04/2020   CREATININE 0.56 (L) 02/04/2020   BUN 24 (H) 02/04/2020   CO2 29 02/04/2020   TSH 2.539 03/22/2019   INR 1.1 04/14/2019      Assessment & Plan:   Acute on chronic respiratory failure with hypoxia (HCC) Persistent.  Patient is on continuous oxygen with the exception of if he is at rest and awake.  He was told he could remove the oxygen in this  situation.  Strongly recommended patient keep follow-up with Dr. Melvyn Novas which he intends to do.  I did start him on Daliresp once daily in the hopes of decreasing future exacerbations and hospitalizations.  COPD with acute exacerbation ([place])  Samples of Spiriva given.  Continue current medications.  Start Daliresp once daily. Keep appointment with Dr. Melvyn Novas.  Osteoporosis with current pathological fracture Patient Prolia was ordered/prior authorized.  We have not heard back. We will determine coverage for him in the next 24 to 48 hours and order his Prolia unless we have some.  I have prioritized him to the top of her Prolia list because he is at such high risk for recurrent fractures being on chronic prednisone treatment.   Meds ordered this encounter  Medications  . Roflumilast (DALIRESP) 250 MCG TABS    Sig: Take 1 tablet by mouth daily.    Dispense:  30 tablet    Refill:  3       Follow-up: No follow-ups on file.  AVS was given to patient prior to departure.  [name] [facility] [phone]

## 2020-02-15 NOTE — Assessment & Plan Note (Addendum)
Persistent.  Patient is on continuous oxygen with the exception of if he is at rest and awake.  He was told he could remove the oxygen in this situation.  Strongly recommended patient keep follow-up with Dr. Melvyn Novas which he intends to do.  I did start him on Daliresp once daily in the hopes of decreasing future exacerbations and hospitalizations.

## 2020-02-16 ENCOUNTER — Inpatient Hospital Stay (HOSPITAL_BASED_OUTPATIENT_CLINIC_OR_DEPARTMENT_OTHER)
Admission: EM | Admit: 2020-02-16 | Discharge: 2020-02-20 | DRG: 190 | Disposition: A | Discharge status: Home Health | Payer: Medicare Other | Attending: Internal Medicine | Admitting: Internal Medicine

## 2020-02-16 ENCOUNTER — Encounter (HOSPITAL_BASED_OUTPATIENT_CLINIC_OR_DEPARTMENT_OTHER): Payer: Self-pay

## 2020-02-16 ENCOUNTER — Other Ambulatory Visit: Payer: Self-pay

## 2020-02-16 ENCOUNTER — Emergency Department (HOSPITAL_BASED_OUTPATIENT_CLINIC_OR_DEPARTMENT_OTHER): Payer: Medicare Other

## 2020-02-16 DIAGNOSIS — Z9981 Dependence on supplemental oxygen: Secondary | ICD-10-CM | POA: Diagnosis not present

## 2020-02-16 DIAGNOSIS — I480 Paroxysmal atrial fibrillation: Secondary | ICD-10-CM | POA: Diagnosis present

## 2020-02-16 DIAGNOSIS — Z87891 Personal history of nicotine dependence: Secondary | ICD-10-CM

## 2020-02-16 DIAGNOSIS — Z7951 Long term (current) use of inhaled steroids: Secondary | ICD-10-CM | POA: Diagnosis not present

## 2020-02-16 DIAGNOSIS — Z20822 Contact with and (suspected) exposure to covid-19: Secondary | ICD-10-CM | POA: Diagnosis present

## 2020-02-16 DIAGNOSIS — Z888 Allergy status to other drugs, medicaments and biological substances status: Secondary | ICD-10-CM | POA: Diagnosis not present

## 2020-02-16 DIAGNOSIS — Z7952 Long term (current) use of systemic steroids: Secondary | ICD-10-CM | POA: Diagnosis not present

## 2020-02-16 DIAGNOSIS — J9601 Acute respiratory failure with hypoxia: Secondary | ICD-10-CM | POA: Diagnosis not present

## 2020-02-16 DIAGNOSIS — I10 Essential (primary) hypertension: Secondary | ICD-10-CM | POA: Diagnosis present

## 2020-02-16 DIAGNOSIS — J9621 Acute and chronic respiratory failure with hypoxia: Secondary | ICD-10-CM | POA: Diagnosis present

## 2020-02-16 DIAGNOSIS — D649 Anemia, unspecified: Secondary | ICD-10-CM | POA: Diagnosis present

## 2020-02-16 DIAGNOSIS — J441 Chronic obstructive pulmonary disease with (acute) exacerbation: Secondary | ICD-10-CM | POA: Diagnosis present

## 2020-02-16 DIAGNOSIS — M810 Age-related osteoporosis without current pathological fracture: Secondary | ICD-10-CM | POA: Diagnosis present

## 2020-02-16 DIAGNOSIS — Z8249 Family history of ischemic heart disease and other diseases of the circulatory system: Secondary | ICD-10-CM | POA: Diagnosis not present

## 2020-02-16 DIAGNOSIS — E1169 Type 2 diabetes mellitus with other specified complication: Secondary | ICD-10-CM | POA: Diagnosis present

## 2020-02-16 DIAGNOSIS — E782 Mixed hyperlipidemia: Secondary | ICD-10-CM | POA: Diagnosis present

## 2020-02-16 DIAGNOSIS — E876 Hypokalemia: Secondary | ICD-10-CM | POA: Diagnosis present

## 2020-02-16 DIAGNOSIS — Z7901 Long term (current) use of anticoagulants: Secondary | ICD-10-CM | POA: Diagnosis not present

## 2020-02-16 DIAGNOSIS — Z7983 Long term (current) use of bisphosphonates: Secondary | ICD-10-CM | POA: Diagnosis not present

## 2020-02-16 DIAGNOSIS — E1159 Type 2 diabetes mellitus with other circulatory complications: Secondary | ICD-10-CM | POA: Diagnosis present

## 2020-02-16 DIAGNOSIS — R0602 Shortness of breath: Secondary | ICD-10-CM | POA: Diagnosis not present

## 2020-02-16 DIAGNOSIS — Z809 Family history of malignant neoplasm, unspecified: Secondary | ICD-10-CM

## 2020-02-16 DIAGNOSIS — R079 Chest pain, unspecified: Secondary | ICD-10-CM | POA: Diagnosis not present

## 2020-02-16 LAB — CBC
HCT: 40 % (ref 39.0–52.0)
Hemoglobin: 12.4 g/dL — ABNORMAL LOW (ref 13.0–17.0)
MCH: 25.4 pg — ABNORMAL LOW (ref 26.0–34.0)
MCHC: 31 g/dL (ref 30.0–36.0)
MCV: 82 fL (ref 80.0–100.0)
Platelets: 325 10*3/uL (ref 150–400)
RBC: 4.88 MIL/uL (ref 4.22–5.81)
RDW: 15.6 % — ABNORMAL HIGH (ref 11.5–15.5)
WBC: 16.1 10*3/uL — ABNORMAL HIGH (ref 4.0–10.5)
nRBC: 0 % (ref 0.0–0.2)

## 2020-02-16 LAB — BASIC METABOLIC PANEL
Anion gap: 13 (ref 5–15)
BUN: 12 mg/dL (ref 8–23)
CO2: 31 mmol/L (ref 22–32)
Calcium: 8.7 mg/dL — ABNORMAL LOW (ref 8.9–10.3)
Chloride: 95 mmol/L — ABNORMAL LOW (ref 98–111)
Creatinine, Ser: 0.68 mg/dL (ref 0.61–1.24)
GFR calc Af Amer: >60 mL/min (ref >60)
GFR calc non Af Amer: >60 mL/min (ref >60)
Glucose, Bld: 173 mg/dL — ABNORMAL HIGH (ref 70–99)
Potassium: 3.4 mmol/L — ABNORMAL LOW (ref 3.5–5.1)
Sodium: 139 mmol/L (ref 135–145)

## 2020-02-16 LAB — BRAIN NATRIURETIC PEPTIDE: B Natriuretic Peptide: 88.9 pg/mL (ref 0.0–100.0)

## 2020-02-16 MED ORDER — IPRATROPIUM BROMIDE HFA 17 MCG/ACT IN AERS
4.0000 | INHALATION_SPRAY | Freq: Once | RESPIRATORY_TRACT | Status: AC
  Administered 2020-02-16: 4 via RESPIRATORY_TRACT
  Filled 2020-02-16: qty 12.9

## 2020-02-16 MED ORDER — IPRATROPIUM BROMIDE HFA 17 MCG/ACT IN AERS
4.0000 | INHALATION_SPRAY | Freq: Once | RESPIRATORY_TRACT | Status: AC
  Administered 2020-02-16: 4 via RESPIRATORY_TRACT

## 2020-02-16 MED ORDER — ALBUTEROL SULFATE HFA 108 (90 BASE) MCG/ACT IN AERS
8.0000 | INHALATION_SPRAY | RESPIRATORY_TRACT | Status: DC | PRN
  Administered 2020-02-16 (×3): 8 via RESPIRATORY_TRACT
  Filled 2020-02-16: qty 6.7

## 2020-02-16 MED ORDER — MAGNESIUM SULFATE 2 GM/50ML IV SOLN
2.0000 g | Freq: Once | INTRAVENOUS | Status: AC
  Administered 2020-02-16: 18:00:00 2 g via INTRAVENOUS
  Filled 2020-02-16: qty 50

## 2020-02-16 MED ORDER — METHYLPREDNISOLONE SODIUM SUCC 125 MG IJ SOLR
125.0000 mg | Freq: Once | INTRAMUSCULAR | Status: AC
  Administered 2020-02-16: 125 mg via INTRAVENOUS
  Filled 2020-02-16: qty 2

## 2020-02-16 MED ORDER — IPRATROPIUM BROMIDE HFA 17 MCG/ACT IN AERS
INHALATION_SPRAY | RESPIRATORY_TRACT | Status: AC
  Filled 2020-02-16: qty 12.9

## 2020-02-16 NOTE — ED Notes (Signed)
Pt on monitor 

## 2020-02-16 NOTE — ED Notes (Signed)
Ambulated patient via physicians' order. Patient resting oxygen saturations were 95% on 2LNC. Patient walked at a normal gait without difficulty although WOB increased 5fold. Upon patient returning to room patient's WOB had increased dramatically with patient complaining of SOB. Patient placed back on monitor and 2LNC with patient oxygen saturation of 92% with RR 32. Physician notified of ambulating.

## 2020-02-16 NOTE — ED Provider Notes (Signed)
[facility] EMERGENCY DEPARTMENT Provider Note   CSN: [id number] Arrival date & time: 02/16/20  1656     History No chief complaint on file.   Paul [name] is a 74 y.o. male.  Patient is a 74 year old male with a history of atrial fibrillation, COPD on chronic oxygen therapy 2 L, diabetes who is presenting today with complaints of shortness of breath.  Patient states that he has been on chronic prednisone therapy and has weaned down to 3 mg but is only been home for the last few weeks after being hospitalized for COPD exacerbation.  When he woke up this morning he was feeling more short of breath with a dry cough and it worsened after sitting outside for 4 hours today.  He reports he just feels extreme tightness in his chest and shortness of breath.  He has not had fever or congestion.  He did use a nebulizer at 2 PM but states it has not helped.  He denies any new swelling or weight gain.  No abdominal pain, nausea or vomiting.  No diaphoresis.  The history is provided by the patient.       Past Medical History:  Diagnosis Date  . A-fib ([place])   . Anemia   . Atrial fibrillation ([place])   . Back pain   . COPD (chronic obstructive pulmonary disease) ([place])   . COPD (chronic obstructive pulmonary disease) ([place])   . Elevated PSA   . GAD (generalized anxiety disorder)   . High cholesterol   . Hypertension   . Impaired fasting glucose   . Osteoporosis     Patient Active Problem List   Diagnosis Date Noted  . Osteoporosis with current pathological fracture 02/06/2020  . Chronic cough 01/31/2020  . Essential hypertension 01/28/2020  . Encounter for general adult medical examination with abnormal findings 01/25/2020  . Chronic bilateral thoracic back pain 01/25/2020  . 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 HLD (hyperlipidemia) 01/05/2020  . Vertebral compression fracture ([place]) 05/29/2019  . Chronic anemia 05/29/2019  . Pulmonary nodules  05/29/2019  . COPD with acute exacerbation ([place]) 05/28/2019  . Chronic anticoagulation 04/06/2019  . Coronary artery calcification 04/06/2019  . Atherosclerosis of aorta ([place]) 04/06/2019  . Paroxysmal atrial fibrillation (HCC)   . Acute on chronic respiratory failure with hypoxia ([place])   . Lung nodule < 6cm on CT 05/29/2016  . Former cigarette smoker 12/05/2015  . Chronic respiratory failure with hypoxia ([place]) 01/27/2013  . COPD GOLD  III      Past Surgical History:  Procedure Laterality Date  . CATARACT EXTRACTION Right   . SKIN SURGERY     shoulders and chest       Family History  Problem Relation Age of Onset  . Heart disease Brother   . Heart disease Mother   . Heart disease Father   . Cancer Paternal Uncle     Social History   Tobacco Use  . Smoking status: Former Smoker    Packs/day: 2.00    Years: 52.00    Pack years: 104.00    Types: Cigarettes    Quit date: 02/03/2009    Years since quitting: 11.0  . Smokeless tobacco: Never Used  . Tobacco comment: Counseled to remain smoke free  Substance Use Topics  . Alcohol use: Yes    Alcohol/week: 0.0 standard drinks    Comment: occ  . Drug use: No    Home Medications Prior to Admission medications  Medication Sig Start Date End Date Taking? Authorizing Provider  albuterol (PROAIR HFA) 108 (90 Base) MCG/ACT inhaler Inhale 2 puffs into the lungs every 6 (six) hours as needed for wheezing or shortness of breath.    [provider]  alendronate (FOSAMAX) 70 MG tablet Take 70 mg by mouth once a week. Take with a full glass of water on an empty stomach.    [provider]  apixaban (ELIQUIS) 5 MG TABS tablet Take 1 tablet (5 mg total) by mouth 2 (two) times daily. 12/27/19   Jerline Pain, MD  atorvastatin (LIPITOR) 40 MG tablet TAKE 1 TABLET(40 MG) BY MOUTH DAILY AFTER SUPPER Patient taking differently: Take 40 mg by mouth daily at 6 PM.  12/27/19   Jerline Pain, MD  budesonide-formoterol  (SYMBICORT) 160-4.5 MCG/ACT inhaler Inhale 2 puffs into the lungs 2 (two) times daily.    [provider]  diltiazem (CARDIZEM CD) 360 MG 24 hr capsule Take 1 capsule (360 mg total) by mouth daily. 09/24/19   Jerline Pain, MD  doxylamine, Sleep, (UNISOM) 25 MG tablet Take 25 mg by mouth at bedtime as needed.    [provider]  furosemide (LASIX) 40 MG tablet Take 1 tablet (40 mg total) by mouth daily. 12/27/19 03/26/20  Jerline Pain, MD  guaiFENesin (MUCINEX) 600 MG 12 hr tablet Take 2 tablets (1,200 mg total) by mouth 2 (two) times daily as needed. Patient taking differently: Take 1,200 mg by mouth 2 (two) times daily.  01/27/20   Tanda Rockers, MD  loratadine (CLARITIN) 10 MG tablet Take 10 mg by mouth daily.    [provider]  LORazepam (ATIVAN) 0.5 MG tablet Take 1 tablet (0.5 mg total) by mouth daily as needed for anxiety. 02/01/20   Cox, Elnita Maxwell, MD  metoprolol tartrate (LOPRESSOR) 25 MG tablet Take 25 mg by mouth 2 (two) times daily.    [provider]  predniSONE (DELTASONE) 10 MG tablet Take 40 mg that is 4 tablets daily for 4 days Then 30 mg that is 3 tablets daily for 4 days Then 20 mg daily and stay on 20 mg daily Patient taking differently: Take 10 mg by mouth daily with breakfast.  02/07/20   Georgette Shell, MD  Roflumilast (DALIRESP) 250 MCG TABS Take 1 tablet by mouth daily. 02/15/20   Cox, Elnita Maxwell, MD  Tiotropium Bromide Monohydrate (SPIRIVA RESPIMAT) 2.5 MCG/ACT AERS Inhale 2.5 mcg into the lungs daily.    [provider]  tiZANidine (ZANAFLEX) 2 MG tablet Take 1 tablet (2 mg total) by mouth every 6 (six) hours as needed for muscle spasms. 01/25/20   [name], MD    Allergies    Alendronate  Review of Systems   Review of Systems  All other systems reviewed and are negative.   Physical Exam Updated Vital Signs BP 133/74   Pulse 79   Temp 98.6 F (37 C) (Oral)   Resp (!) 38   Ht 5\' 6"  (1.676 m)   Wt 63.5 kg   SpO2  100%   BMI 22.60 kg/m   Physical Exam Vitals and nursing note reviewed.  Constitutional:      General: He is not in acute distress.    Appearance: He is well-developed.  HENT:     Head: Normocephalic and atraumatic.     Mouth/Throat:     Mouth: Mucous membranes are moist.  Eyes:     Conjunctiva/sclera: Conjunctivae normal.     Pupils:  Pupils are equal, round, and reactive to light.  Cardiovascular:     Rate and Rhythm: Normal rate and regular rhythm.     Heart sounds: No murmur.  Pulmonary:     Effort: Tachypnea present. No accessory muscle usage or respiratory distress.     Breath sounds: Normal breath sounds. Decreased air movement present. No wheezing or rales.     Comments: Significant decreased air movement throughout Abdominal:     General: There is no distension.     Palpations: Abdomen is soft.     Tenderness: There is no abdominal tenderness. There is no guarding or rebound.  Musculoskeletal:        General: No tenderness. Normal range of motion.     Cervical back: Normal range of motion and neck supple.     Comments: Trace edema present in bilateral lower extremities  Skin:    General: Skin is warm and dry.     Capillary Refill: Capillary refill takes less than 2 seconds.     Findings: No erythema or rash.  Neurological:     General: No focal deficit present.     Mental Status: He is alert and oriented to person, place, and time. Mental status is at baseline.  Psychiatric:        Mood and Affect: Mood normal.        Behavior: Behavior normal.        Thought Content: Thought content normal.     ED Results / Procedures / Treatments   Labs (all labs ordered are listed, but only abnormal results are displayed) Labs Reviewed  BASIC METABOLIC PANEL - Abnormal; Notable for the following components:      Result Value   Potassium 3.4 (*)    Chloride 95 (*)    Glucose, Bld 173 (*)    Calcium 8.7 (*)    All other components within normal limits  CBC - Abnormal;  Notable for the following components:   WBC 16.1 (*)    Hemoglobin 12.4 (*)    MCH 25.4 (*)    RDW 15.6 (*)    All other components within normal limits  SARS CORONAVIRUS 2 (TAT 6-24 HRS)  BRAIN NATRIURETIC PEPTIDE    EKG EKG Interpretation  Date/Time:  Wednesday February 16 2020 17:22:14 EST Ventricular Rate:  83 PR Interval:    QRS Duration: 87 QT Interval:  375 QTC Calculation: 441 R Axis:   70 Text Interpretation: Sinus rhythm No significant change since last tracing Confirmed by Blanchie Dessert [phone]) on 02/16/2020 5:42:01 PM   Radiology DG Chest 2 View  Result Date: 02/16/2020 CLINICAL DATA:  Shortness of breath with chest pain/tightness since this morning. History of COPD. EXAM: CHEST - 2 VIEW COMPARISON:  Radiographs 02/02/2020 and 01/06/2020. CT 02/03/2020. FINDINGS: The heart size and mediastinal contours are stable with mild aortic atherosclerosis. The lungs are hyperinflated but clear. There is no pleural effusion or pneumothorax. Multiple thoracic compression deformities are noted, not significantly changed from recent chest CT. IMPRESSION: Stable chest with evidence of chronic obstructive pulmonary disease. No acute cardiopulmonary process. Electronically Signed   By: Richardean Sale M.D.   On: 02/16/2020 18:47    Procedures Procedures (including critical care time)  Medications Ordered in ED Medications  albuterol (VENTOLIN HFA) 108 (90 Base) MCG/ACT inhaler 8 puff (8 puffs Inhalation Given 02/16/20 1740)  magnesium sulfate IVPB 2 g 50 mL (has no administration in time range)  methylPREDNISolone sodium succinate (SOLU-MEDROL) 125 mg/2 mL injection 125 mg (  has no administration in time range)  ipratropium (ATROVENT HFA) inhaler 4 puff (4 puffs Inhalation Given 02/16/20 1740)    ED Course  I have reviewed the triage vital signs and the nursing notes.  Pertinent labs & imaging results that were available during my care of the patient were reviewed by me and  considered in my medical decision making (see chart for details).    MDM Rules/Calculators/A&P                      Elderly male with multiple medical problems presenting today with shortness of breath and chest tightness.  Suspect this is all respiratory in nature.  Patient does have a history of COPD has recently had increased cough and is weaning down on his prednisone now at 3 mg.  Patient has globally decreased breath sounds but is able to speak in full sentences.  Oxygen saturation is 98% on his home 2 L.  Patient has no significant signs of CHF at this time.  He does have some trace edema in his lower extremities which he states is chronic and unchanged.  EKG without acute findings and lower suspicion for ACS at this time.  Labs show a leukocytosis of 16,000 which may be related to the chronic prednisone use.  BMP and troponin are pending.  Chest x-ray is pending.  Patient given Solu-Medrol, magnesium, albuterol and Atrovent.  Will monitor closely.  9:53 PM Patient initially improved after initial round of meds.  However when attempted to get up and walk patient became short of breath and tachypneic.  Gave another round of albuterol and Atrovent and an hour later attempted to walk again with a similar result.  Will admit for COPD exacerbation as do not feel patient is going to be able to go home.  Final Clinical Impression(s) / ED Diagnoses Final diagnoses:  COPD exacerbation [facility])    Rx / DC Orders ED Discharge Orders    None       Blanchie Dessert, MD 02/16/20 2154

## 2020-02-16 NOTE — ED Triage Notes (Signed)
SOB that began today after sitting outside approx 5 hrs ago. Hx of COPD. 2L n/c 98% in triage. Labored breathing.

## 2020-02-17 ENCOUNTER — Encounter (HOSPITAL_COMMUNITY): Payer: Self-pay | Admitting: Internal Medicine

## 2020-02-17 DIAGNOSIS — J9601 Acute respiratory failure with hypoxia: Secondary | ICD-10-CM | POA: Diagnosis present

## 2020-02-17 DIAGNOSIS — J441 Chronic obstructive pulmonary disease with (acute) exacerbation: Principal | ICD-10-CM

## 2020-02-17 LAB — CBC WITH DIFFERENTIAL/PLATELET
Abs Immature Granulocytes: 0.05 10*3/uL (ref 0.00–0.07)
Basophils Absolute: 0 10*3/uL (ref 0.0–0.1)
Basophils Relative: 0 %
Eosinophils Absolute: 0 10*3/uL (ref 0.0–0.5)
Eosinophils Relative: 0 %
HCT: 32.1 % — ABNORMAL LOW (ref 39.0–52.0)
Hemoglobin: 10.4 g/dL — ABNORMAL LOW (ref 13.0–17.0)
Immature Granulocytes: 1 %
Lymphocytes Relative: 3 %
Lymphs Abs: 0.3 10*3/uL — ABNORMAL LOW (ref 0.7–4.0)
MCH: 25.6 pg — ABNORMAL LOW (ref 26.0–34.0)
MCHC: 32.4 g/dL (ref 30.0–36.0)
MCV: 79.1 fL — ABNORMAL LOW (ref 80.0–100.0)
Monocytes Absolute: 0.1 10*3/uL (ref 0.1–1.0)
Monocytes Relative: 1 %
Neutro Abs: 10.4 10*3/uL — ABNORMAL HIGH (ref 1.7–7.7)
Neutrophils Relative %: 95 %
Platelets: 252 10*3/uL (ref 150–400)
RBC: 4.06 MIL/uL — ABNORMAL LOW (ref 4.22–5.81)
RDW: 15.4 % (ref 11.5–15.5)
WBC: 10.8 10*3/uL — ABNORMAL HIGH (ref 4.0–10.5)
nRBC: 0 % (ref 0.0–0.2)

## 2020-02-17 LAB — GLUCOSE, CAPILLARY
Glucose-Capillary: 203 mg/dL — ABNORMAL HIGH (ref 70–99)
Glucose-Capillary: 78 mg/dL (ref 70–99)
Glucose-Capillary: 148 mg/dL — ABNORMAL HIGH (ref 70–99)
Glucose-Capillary: 146 mg/dL — ABNORMAL HIGH (ref 70–99)

## 2020-02-17 LAB — BASIC METABOLIC PANEL
Anion gap: 12 (ref 5–15)
BUN: 10 mg/dL (ref 8–23)
CO2: 31 mmol/L (ref 22–32)
Calcium: 8.4 mg/dL — ABNORMAL LOW (ref 8.9–10.3)
Chloride: 98 mmol/L (ref 98–111)
Creatinine, Ser: 0.61 mg/dL (ref 0.61–1.24)
GFR calc Af Amer: >60 mL/min (ref >60)
GFR calc non Af Amer: >60 mL/min (ref >60)
Glucose, Bld: 161 mg/dL — ABNORMAL HIGH (ref 70–99)
Potassium: 4.1 mmol/L (ref 3.5–5.1)
Sodium: 141 mmol/L (ref 135–145)

## 2020-02-17 LAB — SARS CORONAVIRUS 2 (TAT 6-24 HRS): SARS Coronavirus 2: NEGATIVE

## 2020-02-17 LAB — TROPONIN I (HIGH SENSITIVITY): Troponin I (High Sensitivity): 6 ng/L (ref <18)

## 2020-02-17 MED ORDER — LORATADINE 10 MG PO TABS
10.0000 mg | ORAL_TABLET | Freq: Every day | ORAL | Status: DC
  Administered 2020-02-17 – 2020-02-20 (×4): 10 mg via ORAL
  Filled 2020-02-17 (×4): qty 1

## 2020-02-17 MED ORDER — DOXYLAMINE SUCCINATE (SLEEP) 25 MG PO TABS
25.0000 mg | ORAL_TABLET | Freq: Every evening | ORAL | Status: DC | PRN
  Administered 2020-02-17 – 2020-02-19 (×3): 25 mg via ORAL
  Filled 2020-02-17 (×7): qty 1

## 2020-02-17 MED ORDER — DILTIAZEM HCL ER COATED BEADS 180 MG PO CP24
360.0000 mg | ORAL_CAPSULE | Freq: Every day | ORAL | Status: DC
  Administered 2020-02-17 – 2020-02-20 (×4): 360 mg via ORAL
  Filled 2020-02-17 (×4): qty 2

## 2020-02-17 MED ORDER — FUROSEMIDE 40 MG PO TABS
40.0000 mg | ORAL_TABLET | Freq: Every day | ORAL | Status: DC
  Administered 2020-02-17 – 2020-02-20 (×4): 40 mg via ORAL
  Filled 2020-02-17 (×4): qty 1

## 2020-02-17 MED ORDER — ALBUTEROL SULFATE (2.5 MG/3ML) 0.083% IN NEBU
2.5000 mg | INHALATION_SOLUTION | RESPIRATORY_TRACT | Status: DC
  Administered 2020-02-17 (×2): 2.5 mg via RESPIRATORY_TRACT
  Filled 2020-02-17 (×2): qty 3

## 2020-02-17 MED ORDER — ATORVASTATIN CALCIUM 40 MG PO TABS
40.0000 mg | ORAL_TABLET | Freq: Every day | ORAL | Status: DC
  Administered 2020-02-17 – 2020-02-19 (×3): 40 mg via ORAL
  Filled 2020-02-17 (×3): qty 1

## 2020-02-17 MED ORDER — METHYLPREDNISOLONE SODIUM SUCC 40 MG IJ SOLR
40.0000 mg | Freq: Two times a day (BID) | INTRAMUSCULAR | Status: DC
  Administered 2020-02-17 – 2020-02-20 (×7): 40 mg via INTRAVENOUS
  Filled 2020-02-17 (×7): qty 1

## 2020-02-17 MED ORDER — APIXABAN 5 MG PO TABS
5.0000 mg | ORAL_TABLET | Freq: Two times a day (BID) | ORAL | Status: DC
  Administered 2020-02-17 – 2020-02-20 (×7): 5 mg via ORAL
  Filled 2020-02-17 (×7): qty 1

## 2020-02-17 MED ORDER — LORAZEPAM 0.5 MG PO TABS: 0.5000 mg | ORAL_TABLET | Freq: Every day | ORAL | Status: DC | PRN

## 2020-02-17 MED ORDER — ACETAMINOPHEN 650 MG RE SUPP: 650.0000 mg | Freq: Four times a day (QID) | RECTAL | Status: DC | PRN

## 2020-02-17 MED ORDER — ROFLUMILAST 500 MCG PO TABS
250.0000 ug | ORAL_TABLET | Freq: Every day | ORAL | Status: DC
  Administered 2020-02-17 – 2020-02-20 (×4): 250 ug via ORAL
  Filled 2020-02-17 (×4): qty 1

## 2020-02-17 MED ORDER — ACETAMINOPHEN 325 MG PO TABS
650.0000 mg | ORAL_TABLET | Freq: Four times a day (QID) | ORAL | Status: DC | PRN
  Administered 2020-02-17: 650 mg via ORAL
  Filled 2020-02-17: qty 2

## 2020-02-17 MED ORDER — ONDANSETRON HCL 4 MG/2ML IJ SOLN: 4.0000 mg | Freq: Four times a day (QID) | INTRAMUSCULAR | Status: DC | PRN

## 2020-02-17 MED ORDER — TIZANIDINE HCL 2 MG PO TABS
2.0000 mg | ORAL_TABLET | Freq: Four times a day (QID) | ORAL | Status: DC | PRN
  Administered 2020-02-17: 2 mg via ORAL
  Filled 2020-02-17 (×3): qty 1

## 2020-02-17 MED ORDER — ONDANSETRON HCL 4 MG PO TABS: 4.0000 mg | ORAL_TABLET | Freq: Four times a day (QID) | ORAL | Status: DC | PRN

## 2020-02-17 MED ORDER — ALBUTEROL SULFATE (2.5 MG/3ML) 0.083% IN NEBU: 2.5000 mg | INHALATION_SOLUTION | RESPIRATORY_TRACT | Status: DC | PRN

## 2020-02-17 MED ORDER — METOPROLOL TARTRATE 25 MG PO TABS
25.0000 mg | ORAL_TABLET | Freq: Two times a day (BID) | ORAL | Status: DC
  Administered 2020-02-17 – 2020-02-20 (×7): 25 mg via ORAL
  Filled 2020-02-17 (×7): qty 1

## 2020-02-17 MED ORDER — INSULIN ASPART 100 UNIT/ML ~~LOC~~ SOLN
0.0000 [IU] | Freq: Three times a day (TID) | SUBCUTANEOUS | Status: DC
  Administered 2020-02-17: 3 [IU] via SUBCUTANEOUS
  Administered 2020-02-18 – 2020-02-19 (×3): 1 [IU] via SUBCUTANEOUS
  Administered 2020-02-19 – 2020-02-20 (×2): 3 [IU] via SUBCUTANEOUS

## 2020-02-17 MED ORDER — UMECLIDINIUM BROMIDE 62.5 MCG/INH IN AEPB
1.0000 | INHALATION_SPRAY | Freq: Every day | RESPIRATORY_TRACT | Status: DC
  Administered 2020-02-17 – 2020-02-20 (×4): 1 via RESPIRATORY_TRACT
  Filled 2020-02-17 (×2): qty 7

## 2020-02-17 MED ORDER — GUAIFENESIN ER 600 MG PO TB12
1200.0000 mg | ORAL_TABLET | Freq: Two times a day (BID) | ORAL | Status: DC
  Administered 2020-02-17 – 2020-02-20 (×7): 1200 mg via ORAL
  Filled 2020-02-17 (×7): qty 2

## 2020-02-17 MED ORDER — MOMETASONE FURO-FORMOTEROL FUM 200-5 MCG/ACT IN AERO
2.0000 | INHALATION_SPRAY | Freq: Two times a day (BID) | RESPIRATORY_TRACT | Status: DC
  Administered 2020-02-17 – 2020-02-20 (×7): 2 via RESPIRATORY_TRACT
  Filled 2020-02-17: qty 8.8

## 2020-02-17 NOTE — H&P (Signed)
History and Physical    Paul [name] [id number] DOB: 1946/05/27 DOA: 02/16/2020  PCP: [name], MD  Patient coming from: Home.  Chief Complaint: Shortness of breath.  HPI: Paul [name] is a 74 y.o. male with history of COPD on home O2 and chronic prednisone therapy, A. fib, diabetes mellitus type 2 diastolic dysfunction, hypertension hyperlipidemia recently admitted for COPD exacerbation presents to the ER admits in [facility] with complaints of worsening shortness of breath with wheezing and cough last 24 hours.  Has some chest tightness initially but resolved.  ED Course: In the ER patient was found to be mildly hypoxic wheezing despite nebulizer treatment chest x-ray unremarkable Covid test negative.  BNP was 88.9 WBC count was 16.1 hemoglobin 12.4 creatinine 0.6.  Patient was started on nebulizer treatment IV Solu-Medrol admitted for COPD exacerbation.  Review of Systems: As per HPI, rest all negative.   Past Medical History:  Diagnosis Date  . A-fib ([place])   . Anemia   . Atrial fibrillation ([place])   . Back pain   . COPD (chronic obstructive pulmonary disease) ([place])   . COPD (chronic obstructive pulmonary disease) ([place])   . Elevated PSA   . GAD (generalized anxiety disorder)   . High cholesterol   . Hypertension   . Impaired fasting glucose   . Osteoporosis     Past Surgical History:  Procedure Laterality Date  . CATARACT EXTRACTION Right   . SKIN SURGERY     shoulders and chest     reports that he quit smoking about 11 years ago. His smoking use included cigarettes. He has a 104.00 pack-year smoking history. He has never used smokeless tobacco. He reports current alcohol use. He reports that he does not use drugs.  Allergies  Allergen Reactions  . Alendronate     Family History  Problem Relation Age of Onset  . Heart disease Brother   . Heart disease Mother   . Heart disease Father   . Cancer Paternal Uncle     Prior to Admission medications     Medication Sig Start Date End Date Taking? Authorizing Provider  albuterol (PROAIR HFA) 108 (90 Base) MCG/ACT inhaler Inhale 2 puffs into the lungs every 6 (six) hours as needed for wheezing or shortness of breath.    [provider]  alendronate (FOSAMAX) 70 MG tablet Take 70 mg by mouth once a week. Take with a full glass of water on an empty stomach.    [provider]  apixaban (ELIQUIS) 5 MG TABS tablet Take 1 tablet (5 mg total) by mouth 2 (two) times daily. 12/27/19   Jerline Pain, MD  atorvastatin (LIPITOR) 40 MG tablet TAKE 1 TABLET(40 MG) BY MOUTH DAILY AFTER SUPPER Patient taking differently: Take 40 mg by mouth daily at 6 PM.  12/27/19   Jerline Pain, MD  budesonide-formoterol (SYMBICORT) 160-4.5 MCG/ACT inhaler Inhale 2 puffs into the lungs 2 (two) times daily.    [provider]  diltiazem (CARDIZEM CD) 360 MG 24 hr capsule Take 1 capsule (360 mg total) by mouth daily. 09/24/19   Jerline Pain, MD  doxylamine, Sleep, (UNISOM) 25 MG tablet Take 25 mg by mouth at bedtime as needed.    [provider]  furosemide (LASIX) 40 MG tablet Take 1 tablet (40 mg total) by mouth daily. 12/27/19 03/26/20  Jerline Pain, MD  guaiFENesin (MUCINEX) 600 MG 12 hr tablet Take 2 tablets (1,200 mg total) by mouth 2 (two) times  daily as needed. Patient taking differently: Take 1,200 mg by mouth 2 (two) times daily.  01/27/20   Tanda Rockers, MD  loratadine (CLARITIN) 10 MG tablet Take 10 mg by mouth daily.    [provider]  LORazepam (ATIVAN) 0.5 MG tablet Take 1 tablet (0.5 mg total) by mouth daily as needed for anxiety. 02/01/20   Cox, Elnita Maxwell, MD  metoprolol tartrate (LOPRESSOR) 25 MG tablet Take 25 mg by mouth 2 (two) times daily.    [provider]  predniSONE (DELTASONE) 10 MG tablet Take 40 mg that is 4 tablets daily for 4 days Then 30 mg that is 3 tablets daily for 4 days Then 20 mg daily and stay on 20 mg daily Patient taking differently: Take  10 mg by mouth daily with breakfast.  02/07/20   Georgette Shell, MD  Roflumilast (DALIRESP) 250 MCG TABS Take 1 tablet by mouth daily. 02/15/20   Cox, Elnita Maxwell, MD  Tiotropium Bromide Monohydrate (SPIRIVA RESPIMAT) 2.5 MCG/ACT AERS Inhale 2.5 mcg into the lungs daily.    [provider]  tiZANidine (ZANAFLEX) 2 MG tablet Take 1 tablet (2 mg total) by mouth every 6 (six) hours as needed for muscle spasms. 01/25/20   [name], MD    Physical Exam: Constitutional: Moderately built and nourished. Vitals:   02/17/20 0000 02/17/20 0030 02/17/20 0200 02/17/20 0212  BP: 118/61 124/64  (!) 142/62  Pulse: 81 75  80  Resp: 19 (!) 22 (!) 24 11  Temp:    98.1 F (36.7 C)  TempSrc:    Oral  SpO2: 99% 98%  98%  Weight:      Height:       Eyes: Anicteric no pallor. ENMT: No discharge from the ears eyes nose or mouth. Neck: No mass or.  No neck rigidity. Respiratory: Bilateral air entry is tight. Cardiovascular: S1-S2 heard. Abdomen: Soft nontender bowel sounds present. Musculoskeletal: No edema. Skin: No rash. Neurologic: Alert awake oriented to time place and person.  Moves all extremities. Psychiatric: Appears normal but normal affect.   Labs on Admission: I have personally reviewed following labs and imaging studies  CBC: Recent Labs  Lab 02/16/20 1725  WBC 16.1*  HGB 12.4*  HCT 40.0  MCV 82.0  PLT [id number]   Basic Metabolic Panel: Recent Labs  Lab 02/16/20 1725  NA 139  K 3.4*  CL 95*  CO2 31  GLUCOSE 173*  BUN 12  CREATININE 0.68  CALCIUM 8.7*   GFR: Estimated Creatinine Clearance: 72.8 mL/min (by C-G formula based on SCr of 0.68 mg/dL). Liver Function Tests: No results for input(s): AST, ALT, ALKPHOS, BILITOT, PROT, ALBUMIN in the last 168 hours. No results for input(s): LIPASE, AMYLASE in the last 168 hours. No results for input(s): AMMONIA in the last 168 hours. Coagulation Profile: No results for input(s): INR, PROTIME in the last 168 hours. Cardiac  Enzymes: No results for input(s): CKTOTAL, CKMB, CKMBINDEX, TROPONINI in the last 168 hours. BNP (last 3 results) No results for input(s): PROBNP in the last 8760 hours. HbA1C: No results for input(s): HGBA1C in the last 72 hours. CBG: No results for input(s): GLUCAP in the last 168 hours. Lipid Profile: No results for input(s): CHOL, HDL, LDLCALC, TRIG, CHOLHDL, LDLDIRECT in the last 72 hours. Thyroid Function Tests: No results for input(s): TSH, T4TOTAL, FREET4, T3FREE, THYROIDAB in the last 72 hours. Anemia Panel: No results for input(s): VITAMINB12, FOLATE, FERRITIN, TIBC, IRON, RETICCTPCT in the last 72 hours. Urine analysis:  Component Value Date/Time   COLORURINE YELLOW 03/03/2019 1325   APPEARANCEUR CLEAR 03/03/2019 1325   LABSPEC <1.005 (L) 03/03/2019 1325   PHURINE 6.5 03/03/2019 1325   GLUCOSEU NEGATIVE 03/03/2019 1325   HGBUR NEGATIVE 03/03/2019 1325   BILIRUBINUR NEGATIVE 03/03/2019 1325   KETONESUR NEGATIVE 03/03/2019 1325   PROTEINUR NEGATIVE 03/03/2019 1325   NITRITE NEGATIVE 03/03/2019 1325   LEUKOCYTESUR NEGATIVE 03/03/2019 1325   Sepsis Labs: @LABRCNTIP (procalcitonin:4,lacticidven:4) ) Recent Results (from the past 240 hour(s))  SARS CORONAVIRUS 2 (TAT 6-24 HRS) Nasopharyngeal Nasopharyngeal Swab     Status: None   Collection Time: 02/16/20  9:00 PM   Specimen: Nasopharyngeal Swab  Result Value Ref Range Status   SARS Coronavirus 2 NEGATIVE NEGATIVE Final    Comment: (NOTE) SARS-CoV-2 target nucleic acids are NOT DETECTED. The SARS-CoV-2 RNA is generally detectable in upper and lower respiratory specimens during the acute phase of infection. Negative results do not preclude SARS-CoV-2 infection, do not rule out co-infections with other pathogens, and should not be used as the sole basis for treatment or other patient management decisions. Negative results must be combined with clinical observations, patient history, and epidemiological information.  The expected result is Negative. Fact Sheet for Patients: SugarRoll.be Fact Sheet for Healthcare Providers: https://www.woods-mathews.com/ This test is not yet approved or cleared by the [place] FDA and  has been authorized for detection and/or diagnosis of SARS-CoV-2 by FDA under an Emergency Use Authorization (EUA). This EUA will remain  in effect (meaning this test can be used) for the duration of the COVID-19 declaration under Section 56 4(b)(1) of the Act, 21 U.S.C. section 360bbb-3(b)(1), unless the authorization is terminated or revoked sooner. Performed at [facility], [place] [address], [place], [place] [address]      Radiological Exams on Admission: DG Chest 2 View  Result Date: 02/16/2020 CLINICAL DATA:  Shortness of breath with chest pain/tightness since this morning. History of COPD. EXAM: CHEST - 2 VIEW COMPARISON:  Radiographs 02/02/2020 and 01/06/2020. CT 02/03/2020. FINDINGS: The heart size and mediastinal contours are stable with mild aortic atherosclerosis. The lungs are hyperinflated but clear. There is no pleural effusion or pneumothorax. Multiple thoracic compression deformities are noted, not significantly changed from recent chest CT. IMPRESSION: Stable chest with evidence of chronic obstructive pulmonary disease. No acute cardiopulmonary process. Electronically Signed   By: Richardean Sale M.D.   On: 02/16/2020 18:47    EKG: Independently reviewed.  Normal sinus rhythm.  Assessment/Plan Principal Problem:   COPD with acute exacerbation (HCC) Active Problems:   Paroxysmal atrial fibrillation (HCC)   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[place])   Essential hypertension   Acute respiratory failure with hypoxia ([place])    1. Acute respiratory failure hypoxia secondary to COPD exacerbation for which I have placed patient on IV Solu-Medrol albuterol nebulizer continue home inhalers.  Closely monitor  respiratory status.  Patient is on home oxygen and takes prednisone chronically. 2. History of A. fib and hypertension on Cardizem metoprolol and takes apixaban. 3. Diastolic dysfunction with last EF measured last EF was 60 to 65% on Lasix.  Appears compensated. 4. Diabetes mellitus type 2 presently on sliding scale coverage. 5. Mild hypokalemia replace recheck. 6. Anemia appears to be chronic follow CBC. 7. Hyperlipidemia on statins.  Given that patient has acute respiratory failure will need close monitoring for any further deterioration in inpatient status.   DVT prophylaxis: Apixaban. Code Status: Full code. Family Communication: Discussed with patient. Disposition Plan: Home.  Consults called: None. Admission status: Inpatient.   Rise Patience MD [facility] Pager [phone].  If 7PM-7AM, please contact night-coverage [email or web address] Password [facility]  02/17/2020, 3:27 AM

## 2020-02-17 NOTE — Plan of Care (Signed)

## 2020-02-17 NOTE — Discharge Instructions (Signed)

## 2020-02-17 NOTE — Plan of Care (Signed)

## 2020-02-17 NOTE — Progress Notes (Signed)
PROGRESS NOTE                                                                                                                                                                                                             Patient Demographics:    Paul [name], is a 74 y.o. male, DOB - [birth year], ET:[id number]  Admit date - 02/16/2020   Admitting Physician Rise Patience, MD  Outpatient Primary MD for the patient is Cox, Elnita Maxwell, MD  LOS - 1   No chief complaint on file.      Brief Narrative   This is a no charge note as patient was seen and admitted earlier today  HPI: Paul [name] is a 74 y.o. male with history of COPD on home O2 and chronic prednisone therapy, A. fib, diabetes mellitus type 2 diastolic dysfunction, hypertension hyperlipidemia recently admitted for COPD exacerbation presents to the ER admits in [facility] with complaints of worsening shortness of breath with wheezing and cough last 24 hours.  Has some chest tightness initially but resolved.  ED Course: In the ER patient was found to be mildly hypoxic wheezing despite nebulizer treatment chest x-ray unremarkable Covid test negative.  BNP was 88.9 WBC count was 16.1 hemoglobin 12.4 creatinine 0.6.  Patient was started on nebulizer treatment IV Solu-Medrol admitted for COPD exacerbation.  Subjective:    Paul [name] today has, No headache, No chest pain, No abdominal pain , reports some cough, dyspnea   Assessment  & Plan :    Principal Problem:   COPD with acute exacerbation (HCC) Active Problems:   Paroxysmal atrial fibrillation (HCC)   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[place])   Essential hypertension   Acute respiratory failure with hypoxia ([place])   1. Acute respiratory failure hypoxia secondary to COPD exacerbation for which I have placed patient on IV Solu-Medrol albuterol nebulizer continue home inhalers.  Closely monitor respiratory status.  Patient is on  home oxygen and takes prednisone chronically.  Patient with significant productive cough, he was encouraged to use incentive spirometry and flutter valve. 2. History of A. fib and hypertension on Cardizem metoprolol and takes apixaban. 3. Diastolic dysfunction with last EF measured last EF was 60 to 65% on Lasix.  Appears compensated. 4. Diabetes mellitus type 2 presently on sliding scale coverage. 5. Mild hypokalemia replace  recheck. 6. Anemia appears to be chronic follow CBC. 7. Hyperlipidemia on statins.    Disposition: Home once stable, so far he remains on 4 L nasal cannula, and significant wheezing, on IV steroids  COVID-19 Labs  No results for input(s): DDIMER, FERRITIN, LDH, CRP in the last 72 hours.  Lab Results  Component Value Date   SARSCOV2NAA NEGATIVE 02/16/2020   SARSCOV2NAA NEGATIVE 02/02/2020   SARSCOV2NAA NEGATIVE 01/04/2020   [place] NEGATIVE 05/28/2019      Lab Results  Component Value Date   PLT 252 02/17/2020    Antibiotics  :   Anti-infectives (From admission, onward)   None        Objective:   Vitals:   02/17/20 0713 02/17/20 0715 02/17/20 0759 02/17/20 1000  BP:   (!) 120/55   Pulse:   87   Resp:   18   Temp:   98.3 F (36.8 C)   TempSrc:   Oral   SpO2: 98% 98% 94% 96%  Weight:      Height:        Wt Readings from Last 3 Encounters:  02/16/20 63.5 kg  02/15/20 63.5 kg  02/02/20 65.3 kg     Intake/Output Summary (Last 24 hours) at 02/17/2020 1319 Last data filed at 02/17/2020 0537 Gross per 24 hour  Intake 180 ml  Output 200 ml  Net -20 ml     Physical Exam  Awake Alert, Oriented X 3, No new F.N deficits, Normal affect Symmetrical Chest wall movement, Good air movement bilaterally, has bilateral wheezing RRR,No Gallops,Rubs or new Murmurs, No Parasternal Heave +ve B.Sounds, Abd Soft, No tenderness, No organomegaly appriciated, No rebound - guarding or rigidity. No Cyanosis, Clubbing or edema, No new Rash or bruise        Data Review:    CBC Recent Labs  Lab 02/16/20 1725 02/17/20 0331  WBC 16.1* 10.8*  HGB 12.4* 10.4*  HCT 40.0 32.1*  PLT 325 252  MCV 82.0 79.1*  MCH 25.4* 25.6*  MCHC 31.0 32.4  RDW 15.6* 15.4  LYMPHSABS  --  0.3*  MONOABS  --  0.1  EOSABS  --  0.0  BASOSABS  --  0.0    Chemistries  Recent Labs  Lab 02/16/20 1725 02/17/20 0331  NA 139 141  K 3.4* 4.1  CL 95* 98  CO2 31 31  GLUCOSE 173* 161*  BUN 12 10  CREATININE 0.68 0.61  CALCIUM 8.7* 8.4*   ------------------------------------------------------------------------------------------------------------------ No results for input(s): CHOL, HDL, LDLCALC, TRIG, CHOLHDL, LDLDIRECT in the last 72 hours.  No results found for: HGBA1C ------------------------------------------------------------------------------------------------------------------ No results for input(s): TSH, T4TOTAL, T3FREE, THYROIDAB in the last 72 hours.  Invalid input(s): FREET3 ------------------------------------------------------------------------------------------------------------------ No results for input(s): VITAMINB12, FOLATE, FERRITIN, TIBC, IRON, RETICCTPCT in the last 72 hours.  Coagulation profile No results for input(s): INR, PROTIME in the last 168 hours.  No results for input(s): DDIMER in the last 72 hours.  Cardiac Enzymes No results for input(s): CKMB, TROPONINI, MYOGLOBIN in the last 168 hours.  Invalid input(s): CK ------------------------------------------------------------------------------------------------------------------    Component Value Date/Time   BNP 88.9 02/16/2020 1726    Inpatient Medications  Scheduled Meds: . apixaban  5 mg Oral BID  . atorvastatin  40 mg Oral q1800  . diltiazem  360 mg Oral Daily  . furosemide  40 mg Oral Daily  . guaiFENesin  1,200 mg Oral BID  . insulin aspart  0-9 Units Subcutaneous TID WC  . loratadine  10 mg  Oral Daily  . methylPREDNISolone (SOLU-MEDROL) injection   40 mg Intravenous Q12H  . metoprolol tartrate  25 mg Oral BID  . mometasone-formoterol  2 puff Inhalation BID  . roflumilast  250 mcg Oral Daily  . umeclidinium bromide  1 puff Inhalation Daily   Continuous Infusions: PRN Meds:.acetaminophen **OR** acetaminophen, albuterol, doxylamine (Sleep), LORazepam, ondansetron **OR** ondansetron (ZOFRAN) IV, tiZANidine  Micro Results Recent Results (from the past 240 hour(s))  SARS CORONAVIRUS 2 (TAT 6-24 HRS) Nasopharyngeal Nasopharyngeal Swab     Status: None   Collection Time: 02/16/20  9:00 PM   Specimen: Nasopharyngeal Swab  Result Value Ref Range Status   SARS Coronavirus 2 NEGATIVE NEGATIVE Final    Comment: (NOTE) SARS-CoV-2 target nucleic acids are NOT DETECTED. The SARS-CoV-2 RNA is generally detectable in upper and lower respiratory specimens during the acute phase of infection. Negative results do not preclude SARS-CoV-2 infection, do not rule out co-infections with other pathogens, and should not be used as the sole basis for treatment or other patient management decisions. Negative results must be combined with clinical observations, patient history, and epidemiological information. The expected result is Negative. Fact Sheet for Patients: SugarRoll.be Fact Sheet for Healthcare Providers: https://www.woods-mathews.com/ This test is not yet approved or cleared by the [place] FDA and  has been authorized for detection and/or diagnosis of SARS-CoV-2 by FDA under an Emergency Use Authorization (EUA). This EUA will remain  in effect (meaning this test can be used) for the duration of the COVID-19 declaration under Section 56 4(b)(1) of the Act, 21 U.S.C. section 360bbb-3(b)(1), unless the authorization is terminated or revoked sooner. Performed at [facility], [place] [address], [place], [place] [address]     Radiology Reports DG Chest 2 View  Result Date:  02/16/2020 CLINICAL DATA:  Shortness of breath with chest pain/tightness since this morning. History of COPD. EXAM: CHEST - 2 VIEW COMPARISON:  Radiographs 02/02/2020 and 01/06/2020. CT 02/03/2020. FINDINGS: The heart size and mediastinal contours are stable with mild aortic atherosclerosis. The lungs are hyperinflated but clear. There is no pleural effusion or pneumothorax. Multiple thoracic compression deformities are noted, not significantly changed from recent chest CT. IMPRESSION: Stable chest with evidence of chronic obstructive pulmonary disease. No acute cardiopulmonary process. Electronically Signed   By: Richardean Sale M.D.   On: 02/16/2020 18:47   CT CHEST W CONTRAST  Result Date: 02/03/2020 CLINICAL DATA:  COPD exacerbation. EXAM: CT CHEST WITH CONTRAST TECHNIQUE: Multidetector CT imaging of the chest was performed during intravenous contrast administration. CONTRAST:  40mL OMNIPAQUE IOHEXOL 300 MG/ML  SOLN COMPARISON:  May 28, 2019. FINDINGS: Cardiovascular: Atherosclerosis of thoracic aorta is noted without aneurysm formation. Normal cardiac size. No pericardial effusion. Coronary artery calcifications are noted. Mediastinum/Nodes: No enlarged mediastinal, hilar, or axillary lymph nodes. Thyroid gland, trachea, and esophagus demonstrate no significant findings. Lungs/Pleura: No pneumothorax or pleural effusion is noted. Emphysematous disease is noted in the upper lobes bilaterally. Calcified biapical scarring is noted. No acute consolidative process is noted. Upper Abdomen: No acute abnormality. Musculoskeletal: There is interval development of several old thoracic compression fractures in the mid thoracic spine since prior exam. No definite acute fracture is noted. IMPRESSION: 1. Coronary artery calcifications are noted suggesting coronary artery disease. 2. Emphysematous disease is noted in the upper lobes bilaterally. 3. Interval development of several old thoracic compression fractures since  prior exam. No definite acute fracture is noted. Aortic Atherosclerosis (ICD10-I70.0). Emphysema (ICD10-J43.9). Electronically Signed   By: Sabino Dick  Jr M.D.   On: 02/03/2020 16:36   DG Chest Port 1 View  Result Date: 02/02/2020 CLINICAL DATA:  Cough, COPD EXAM: PORTABLE CHEST 1 VIEW COMPARISON:  01/06/2020 FINDINGS: The lungs are hyperinflated likely secondary to COPD. There is no focal consolidation. There is no pleural effusion or pneumothorax. The heart and mediastinal contours are unremarkable. There is no acute osseous abnormality. IMPRESSION: No active disease. Electronically Signed   By: Kathreen Devoid   On: 02/02/2020 13:10   CT MAXILLOFACIAL WO CONTRAST  Result Date: 02/03/2020 CLINICAL DATA:  74 year old male with respiratory failure. COPD. Productive cough, shortness of breath. EXAM: CT MAXILLOFACIAL WITHOUT CONTRAST TECHNIQUE: Multidetector CT imaging of the maxillofacial structures was performed. Multiplanar CT image reconstructions were also generated. COMPARISON:  Head CT 06/17/2013. FINDINGS: Osseous: Absent dentition. No acute osseous abnormality identified. Lower cervical spine disc and endplate degeneration. Posterior element ankylosis at C2-C3. Orbits: Intact orbital walls. Postoperative changes to both globes, otherwise negative noncontrast orbits soft tissues. Sinuses: Clear aside from isolated mild mucosal thickening or fluid in the left ethmoid air cell (coronal series 9, image 24). Symmetric but mild nasal cavity mucosal thickening, trace retained secretions in the right nasal cavity. Olfactory recesses are pneumatized. Nasal septum is intact. Bilateral tympanic cavities and mastoids are also well pneumatized. Soft tissues: Negative visible larynx, pharynx, parapharyngeal spaces, retropharyngeal space, sublingual space, submandibular and parotid glands. No upper cervical lymphadenopathy. Calcified bilateral carotid bifurcation atherosclerosis. Limited intracranial: Calcified  atherosclerosis at the skull base. Stable and negative for age visible noncontrast brain parenchyma compared to 2014. IMPRESSION: 1. Minimal paranasal sinus mucosal thickening, limited to a left ethmoid air cell. No convincing acute sinusitis. 2. Symmetric but mild nasal cavity mucosal thickening, raising the possibility of mild rhinitis. 3. No other acute findings in the face. Calcified carotid atherosclerosis. Electronically Signed   By: Genevie Ann M.D.   On: 02/03/2020 16:28     Phillips Climes M.D on 02/17/2020 at 1:19 PM  Between 7am to 7pm - Pager - [phone]  After 7pm go to [email or web address] - password [facility]  [facility] -  Office  [phone]

## 2020-02-17 NOTE — Progress Notes (Signed)
Pt arrived via EMS, on 3L [place]. Pt is SOB w/ labored breathing upon transfer to bed. PT is w/o complaints or issues at this time. Waiting for orders.

## 2020-02-17 NOTE — ED Notes (Signed)
Report given to: Benjie Karvonen, RN with [facility]; eta 20 minutes.

## 2020-02-17 NOTE — Progress Notes (Signed)
RT NOTES: Flutter ordered on patient. Patient brought his own from home and does not want this one.

## 2020-02-18 DIAGNOSIS — J9601 Acute respiratory failure with hypoxia: Secondary | ICD-10-CM

## 2020-02-18 DIAGNOSIS — E1169 Type 2 diabetes mellitus with other specified complication: Secondary | ICD-10-CM

## 2020-02-18 DIAGNOSIS — I10 Essential (primary) hypertension: Secondary | ICD-10-CM

## 2020-02-18 DIAGNOSIS — E782 Mixed hyperlipidemia: Secondary | ICD-10-CM

## 2020-02-18 LAB — CBC
HCT: 32.2 % — ABNORMAL LOW (ref 39.0–52.0)
Hemoglobin: 10.3 g/dL — ABNORMAL LOW (ref 13.0–17.0)
MCH: 25.8 pg — ABNORMAL LOW (ref 26.0–34.0)
MCHC: 32 g/dL (ref 30.0–36.0)
MCV: 80.7 fL (ref 80.0–100.0)
Platelets: 262 10*3/uL (ref 150–400)
RBC: 3.99 MIL/uL — ABNORMAL LOW (ref 4.22–5.81)
RDW: 15.4 % (ref 11.5–15.5)
WBC: 13.2 10*3/uL — ABNORMAL HIGH (ref 4.0–10.5)
nRBC: 0 % (ref 0.0–0.2)

## 2020-02-18 LAB — GLUCOSE, CAPILLARY
Glucose-Capillary: 137 mg/dL — ABNORMAL HIGH (ref 70–99)
Glucose-Capillary: 121 mg/dL — ABNORMAL HIGH (ref 70–99)
Glucose-Capillary: 110 mg/dL — ABNORMAL HIGH (ref 70–99)
Glucose-Capillary: 247 mg/dL — ABNORMAL HIGH (ref 70–99)

## 2020-02-18 LAB — BASIC METABOLIC PANEL
Anion gap: 9 (ref 5–15)
BUN: 20 mg/dL (ref 8–23)
CO2: 30 mmol/L (ref 22–32)
Calcium: 8.4 mg/dL — ABNORMAL LOW (ref 8.9–10.3)
Chloride: 101 mmol/L (ref 98–111)
Creatinine, Ser: 0.67 mg/dL (ref 0.61–1.24)
GFR calc Af Amer: >60 mL/min (ref >60)
GFR calc non Af Amer: >60 mL/min (ref >60)
Glucose, Bld: 160 mg/dL — ABNORMAL HIGH (ref 70–99)
Potassium: 4 mmol/L (ref 3.5–5.1)
Sodium: 140 mmol/L (ref 135–145)

## 2020-02-18 NOTE — Progress Notes (Signed)
PROGRESS NOTE                                                                                                                                                                                                             Patient Demographics:    Paul [name], is a 74 y.o. male, DOB - 04-07-1946, [id number]  Admit date - 02/16/2020   Admitting Physician Rise Patience, MD  Outpatient Primary MD for the patient is Cox, Elnita Maxwell, MD  LOS - 2   No chief complaint on file.      Brief Narrative    Paul [name] is a 74 y.o. male with history of COPD on home O2 and chronic prednisone therapy, A. fib, diabetes mellitus type 2 diastolic dysfunction, hypertension hyperlipidemia recently admitted for COPD exacerbation presents to the ER admits in [facility] with complaints of worsening shortness of breath with wheezing and cough last 24 hours.  Has some chest tightness initially but resolved. patient was found to be mildly hypoxic in ED, with  wheezing despite nebulizer treatment chest x-ray unremarkable Covid test negative.  BNP was 88.9 WBC count was 16.1 hemoglobin 12.4 creatinine 0.6.  Patient was started on nebulizer treatment IV Solu-Medrol admitted for COPD exacerbation.   Subjective:    Paul [name] today has, No headache, No chest pain, No abdominal pain , reports some cough, dyspnea   Assessment  & Plan :    Principal Problem:   COPD with acute exacerbation (HCC) Active Problems:   Paroxysmal atrial fibrillation (HCC)   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[place])   Essential hypertension   Acute respiratory failure with hypoxia (HCC)  Acute on chronic hypoxic  respiratory failure secondary to COPD exacerbation - Patient on 2 L nasal cannula mainly at bedtime, but currently requiring 2 L at rest, 4 L with activity. -Continue with IV Solu-Medrol, given he still having significant wheezing, continue with home inhalers. -I have  encouraged him to use incentive spirometry and flutter valve.Marland Kitchen  History of A. fib and hypertension  - on Cardizem metoprolol and takes apixaban.  Chronic Diastolic dysfunction  - with last EF measured last EF was 60 to 65% on Lasix.  Appears compensated.  Diabetes mellitus type 2  - presently on sliding scale coverage.  Mild hypokalemia - repleted  Normocytic Anemia  -At Baseline,  continue to monitor   Hyperlipidemia  - on statins  COVID-19 Labs  No results for input(s): DDIMER, FERRITIN, LDH, CRP in the last 72 hours.  Lab Results  Component Value Date   SARSCOV2NAA NEGATIVE 02/16/2020   SARSCOV2NAA NEGATIVE 02/02/2020   SARSCOV2NAA NEGATIVE 01/04/2020   [place] NEGATIVE 05/28/2019      Lab Results  Component Value Date   PLT 262 02/18/2020    Antibiotics  :   Anti-infectives (From admission, onward)   None        Objective:   Vitals:   02/18/20 0536 02/18/20 0734 02/18/20 0741 02/18/20 0742  BP: 112/68 (!) 115/56    Pulse: 71 76    Resp: 14 16    Temp: 98.6 F (37 C) 98.1 F (36.7 C)    TempSrc:  Oral    SpO2: 98% 92% 92% 92%  Weight:      Height:        Wt Readings from Last 3 Encounters:  02/16/20 63.5 kg  02/15/20 63.5 kg  02/02/20 65.3 kg     Intake/Output Summary (Last 24 hours) at 02/18/2020 1604 Last data filed at 02/18/2020 0734 Gross per 24 hour  Intake 120 ml  Output 200 ml  Net -80 ml     Physical Exam  Awake Alert, Oriented X 3, No new F.N deficits, Normal affect Symmetrical Chest wall movement, Good air movement bilaterally, wheezing has improved RRR,No Gallops,Rubs or new Murmurs, No Parasternal Heave +ve B.Sounds, Abd Soft, No tenderness, No rebound - guarding or rigidity. No Cyanosis, Clubbing or edema, No new Rash or bruise      Data Review:    CBC Recent Labs  Lab 02/16/20 1725 02/17/20 0331 02/18/20 0207  WBC 16.1* 10.8* 13.2*  HGB 12.4* 10.4* 10.3*  HCT 40.0 32.1* 32.2*  PLT 325 252 262  MCV  82.0 79.1* 80.7  MCH 25.4* 25.6* 25.8*  MCHC 31.0 32.4 32.0  RDW 15.6* 15.4 15.4  LYMPHSABS  --  0.3*  --   MONOABS  --  0.1  --   EOSABS  --  0.0  --   BASOSABS  --  0.0  --     Chemistries  Recent Labs  Lab 02/16/20 1725 02/17/20 0331 02/18/20 0207  NA 139 141 140  K 3.4* 4.1 4.0  CL 95* 98 101  CO2 31 31 30   GLUCOSE 173* 161* 160*  BUN 12 10 20   CREATININE 0.68 0.61 0.67  CALCIUM 8.7* 8.4* 8.4*   ------------------------------------------------------------------------------------------------------------------ No results for input(s): CHOL, HDL, LDLCALC, TRIG, CHOLHDL, LDLDIRECT in the last 72 hours.  No results found for: HGBA1C ------------------------------------------------------------------------------------------------------------------ No results for input(s): TSH, T4TOTAL, T3FREE, THYROIDAB in the last 72 hours.  Invalid input(s): FREET3 ------------------------------------------------------------------------------------------------------------------ No results for input(s): VITAMINB12, FOLATE, FERRITIN, TIBC, IRON, RETICCTPCT in the last 72 hours.  Coagulation profile No results for input(s): INR, PROTIME in the last 168 hours.  No results for input(s): DDIMER in the last 72 hours.  Cardiac Enzymes No results for input(s): CKMB, TROPONINI, MYOGLOBIN in the last 168 hours.  Invalid input(s): CK ------------------------------------------------------------------------------------------------------------------    Component Value Date/Time   BNP 88.9 02/16/2020 1726    Inpatient Medications  Scheduled Meds: . apixaban  5 mg Oral BID  . atorvastatin  40 mg Oral q1800  . diltiazem  360 mg Oral Daily  . furosemide  40 mg Oral Daily  . guaiFENesin  1,200 mg Oral BID  . insulin aspart  0-9 Units Subcutaneous TID WC  .  loratadine  10 mg Oral Daily  . methylPREDNISolone (SOLU-MEDROL) injection  40 mg Intravenous Q12H  . metoprolol tartrate  25 mg Oral BID    . mometasone-formoterol  2 puff Inhalation BID  . roflumilast  250 mcg Oral Daily  . umeclidinium bromide  1 puff Inhalation Daily   Continuous Infusions: PRN Meds:.acetaminophen **OR** acetaminophen, albuterol, doxylamine (Sleep), LORazepam, ondansetron **OR** ondansetron (ZOFRAN) IV, tiZANidine  Micro Results Recent Results (from the past 240 hour(s))  SARS CORONAVIRUS 2 (TAT 6-24 HRS) Nasopharyngeal Nasopharyngeal Swab     Status: None   Collection Time: 02/16/20  9:00 PM   Specimen: Nasopharyngeal Swab  Result Value Ref Range Status   SARS Coronavirus 2 NEGATIVE NEGATIVE Final    Comment: (NOTE) SARS-CoV-2 target nucleic acids are NOT DETECTED. The SARS-CoV-2 RNA is generally detectable in upper and lower respiratory specimens during the acute phase of infection. Negative results do not preclude SARS-CoV-2 infection, do not rule out co-infections with other pathogens, and should not be used as the sole basis for treatment or other patient management decisions. Negative results must be combined with clinical observations, patient history, and epidemiological information. The expected result is Negative. Fact Sheet for Patients: SugarRoll.be Fact Sheet for Healthcare Providers: https://www.woods-mathews.com/ This test is not yet approved or cleared by the [place] FDA and  has been authorized for detection and/or diagnosis of SARS-CoV-2 by FDA under an Emergency Use Authorization (EUA). This EUA will remain  in effect (meaning this test can be used) for the duration of the COVID-19 declaration under Section 56 4(b)(1) of the Act, 21 U.S.C. section 360bbb-3(b)(1), unless the authorization is terminated or revoked sooner. Performed at [facility], [place] [address] [place], [place] [address]     Radiology Reports DG Chest 2 View  Result Date: 02/16/2020 CLINICAL DATA:  Shortness of breath with chest pain/tightness since  this morning. History of COPD. EXAM: CHEST - 2 VIEW COMPARISON:  Radiographs 02/02/2020 and 01/06/2020. CT 02/03/2020. FINDINGS: The heart size and mediastinal contours are stable with mild aortic atherosclerosis. The lungs are hyperinflated but clear. There is no pleural effusion or pneumothorax. Multiple thoracic compression deformities are noted, not significantly changed from recent chest CT. IMPRESSION: Stable chest with evidence of chronic obstructive pulmonary disease. No acute cardiopulmonary process. Electronically Signed   By: Richardean Sale M.D.   On: 02/16/2020 18:47   CT CHEST W CONTRAST  Result Date: 02/03/2020 CLINICAL DATA:  COPD exacerbation. EXAM: CT CHEST WITH CONTRAST TECHNIQUE: Multidetector CT imaging of the chest was performed during intravenous contrast administration. CONTRAST:  29mL OMNIPAQUE IOHEXOL 300 MG/ML  SOLN COMPARISON:  May 28, 2019. FINDINGS: Cardiovascular: Atherosclerosis of thoracic aorta is noted without aneurysm formation. Normal cardiac size. No pericardial effusion. Coronary artery calcifications are noted. Mediastinum/Nodes: No enlarged mediastinal, hilar, or axillary lymph nodes. Thyroid gland, trachea, and esophagus demonstrate no significant findings. Lungs/Pleura: No pneumothorax or pleural effusion is noted. Emphysematous disease is noted in the upper lobes bilaterally. Calcified biapical scarring is noted. No acute consolidative process is noted. Upper Abdomen: No acute abnormality. Musculoskeletal: There is interval development of several old thoracic compression fractures in the mid thoracic spine since prior exam. No definite acute fracture is noted. IMPRESSION: 1. Coronary artery calcifications are noted suggesting coronary artery disease. 2. Emphysematous disease is noted in the upper lobes bilaterally. 3. Interval development of several old thoracic compression fractures since prior exam. No definite acute fracture is noted. Aortic Atherosclerosis  (ICD10-I70.0). Emphysema (ICD10-J43.9). Electronically Signed  By: Marijo Conception M.D.   On: 02/03/2020 16:36   DG Chest Port 1 View  Result Date: 02/02/2020 CLINICAL DATA:  Cough, COPD EXAM: PORTABLE CHEST 1 VIEW COMPARISON:  01/06/2020 FINDINGS: The lungs are hyperinflated likely secondary to COPD. There is no focal consolidation. There is no pleural effusion or pneumothorax. The heart and mediastinal contours are unremarkable. There is no acute osseous abnormality. IMPRESSION: No active disease. Electronically Signed   By: Kathreen Devoid   On: 02/02/2020 13:10   CT MAXILLOFACIAL WO CONTRAST  Result Date: 02/03/2020 CLINICAL DATA:  74 year old male with respiratory failure. COPD. Productive cough, shortness of breath. EXAM: CT MAXILLOFACIAL WITHOUT CONTRAST TECHNIQUE: Multidetector CT imaging of the maxillofacial structures was performed. Multiplanar CT image reconstructions were also generated. COMPARISON:  Head CT 06/17/2013. FINDINGS: Osseous: Absent dentition. No acute osseous abnormality identified. Lower cervical spine disc and endplate degeneration. Posterior element ankylosis at C2-C3. Orbits: Intact orbital walls. Postoperative changes to both globes, otherwise negative noncontrast orbits soft tissues. Sinuses: Clear aside from isolated mild mucosal thickening or fluid in the left ethmoid air cell (coronal series 9, image 24). Symmetric but mild nasal cavity mucosal thickening, trace retained secretions in the right nasal cavity. Olfactory recesses are pneumatized. Nasal septum is intact. Bilateral tympanic cavities and mastoids are also well pneumatized. Soft tissues: Negative visible larynx, pharynx, parapharyngeal spaces, retropharyngeal space, sublingual space, submandibular and parotid glands. No upper cervical lymphadenopathy. Calcified bilateral carotid bifurcation atherosclerosis. Limited intracranial: Calcified atherosclerosis at the skull base. Stable and negative for age visible  noncontrast brain parenchyma compared to 2014. IMPRESSION: 1. Minimal paranasal sinus mucosal thickening, limited to a left ethmoid air cell. No convincing acute sinusitis. 2. Symmetric but mild nasal cavity mucosal thickening, raising the possibility of mild rhinitis. 3. No other acute findings in the face. Calcified carotid atherosclerosis. Electronically Signed   By: Genevie Ann M.D.   On: 02/03/2020 16:28     Phillips Climes M.D on 02/18/2020 at 4:04 PM  Between 7am to 7pm - Pager - [phone]  After 7pm go to [email or web address] - password [email or web address]  [facility] -  Office  [phone]

## 2020-02-18 NOTE — Evaluation (Signed)
SATURATION QUALIFICATIONS: (This note is used to comply with regulatory documentation for home oxygen)  Patient Saturations on Room Air at Rest = 92%  Patient Saturations on Room Air while Ambulating = not tested  Patient Saturation on 2L [place] while ambulating = 88%  Please briefly explain why patient needs home oxygen: Patient ambulated about 37ft before becoming winded. O2 saturation dropped to 88% on 2L [place]. Increased to 3L and patient was able to ambulate back to room after recovering back to 92%. Patient has been on home O2 at night only prior to this admission. Will likely need for activity.

## 2020-02-19 LAB — GLUCOSE, CAPILLARY
Glucose-Capillary: 137 mg/dL — ABNORMAL HIGH (ref 70–99)
Glucose-Capillary: 216 mg/dL — ABNORMAL HIGH (ref 70–99)
Glucose-Capillary: 110 mg/dL — ABNORMAL HIGH (ref 70–99)
Glucose-Capillary: 184 mg/dL — ABNORMAL HIGH (ref 70–99)

## 2020-02-19 NOTE — Progress Notes (Signed)
PROGRESS NOTE                                                                                                                                                                                                             Patient Demographics:    Paul [name], is a 74 y.o. male, DOB - 07/13/1946, IW:[id number]  Admit date - 02/16/2020   Admitting Physician Rise Patience, MD  Outpatient Primary MD for the patient is Cox, Elnita Maxwell, MD  LOS - 3   No chief complaint on file.      Brief Narrative    Paul [name] is a 74 y.o. male with history of COPD on home O2 and chronic prednisone therapy, A. fib, diabetes mellitus type 2 diastolic dysfunction, hypertension hyperlipidemia recently admitted for COPD exacerbation presents to the ER admits in [facility] with complaints of worsening shortness of breath with wheezing and cough last 24 hours.  Has some chest tightness initially but resolved. patient was found to be mildly hypoxic in ED, with  wheezing despite nebulizer treatment chest x-ray unremarkable Covid test negative.  BNP was 88.9 WBC count was 16.1 hemoglobin 12.4 creatinine 0.6.  Patient was started on nebulizer treatment IV Solu-Medrol admitted for COPD exacerbation.   Subjective:    Paul [name] today he had some dyspnea and increased oxygen requirement with activity yesterday.   Assessment  & Plan :    Principal Problem:   COPD with acute exacerbation ([place]) Active Problems:   Paroxysmal atrial fibrillation (HCC)   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[place])   Essential hypertension   Acute respiratory failure with hypoxia (HCC)  Acute on chronic hypoxic  respiratory failure secondary to COPD exacerbation - Patient on 2 L nasal cannula mainly at bedtime, but currently requiring 2 L at rest, 4 L with activity. -Continue with IV Solu-Medrol, he still dyspneic with some wheezing, so we will hold on further tapering today, - continue  with home inhalers. -I have encouraged him to use incentive spirometry and flutter valve.Marland Kitchen  History of A. fib and hypertension  - on Cardizem metoprolol and takes apixaban.  Chronic Diastolic dysfunction  - with last EF measured last EF was 60 to 65% on Lasix.  Appears compensated.  Diabetes mellitus type 2  - presently on sliding scale coverage.  Appears to be labile,  with some readings in the 200s, but will hold on increasing his insulin regimen to avoid hypoglycemia  Mild hypokalemia - repleted  Normocytic Anemia  -At Baseline, continue to monitor   Hyperlipidemia  - on statins   COVID-19 Labs  No results for input(s): DDIMER, FERRITIN, LDH, CRP in the last 72 hours.  Lab Results  Component Value Date   [place] NEGATIVE 02/16/2020   [place] NEGATIVE 02/02/2020   [place] NEGATIVE 01/04/2020   [place] NEGATIVE 05/28/2019    Code Status : Full  Family Communication  : D/W patient  Disposition Plan  : Home  Barriers For Discharge :  Home with home health, hopefully tomorrow when respiratory status improves  Consults  :  None  Procedures  : None  DVT Prophylaxis  :  [place] lovenox   Lab Results  Component Value Date   PLT 262 02/18/2020    Antibiotics  :   Anti-infectives (From admission, onward)   None        Objective:   Vitals:   02/18/20 1949 02/19/20 0500 02/19/20 0822 02/19/20 0923  BP: 126/70  (!) 143/73   Pulse: 77  82   Resp: (!) 23  16   Temp: 98.3 F (36.8 C)  98.5 F (36.9 C)   TempSrc: Oral     SpO2: 94%  97% 96%  Weight:  59.4 kg    Height:        Wt Readings from Last 3 Encounters:  02/19/20 59.4 kg  02/15/20 63.5 kg  02/02/20 65.3 kg    No intake or output data in the 24 hours ending 02/19/20 1443   Physical Exam  Awake Alert, Oriented X 3, No new F.N deficits, Normal affect Symmetrical Chest wall movement, Good air movement bilaterally, minimal wheezing RRR,No Gallops,Rubs or new Murmurs, No  Parasternal Heave +ve B.Sounds, Abd Soft, No tenderness, No rebound - guarding or rigidity. No Cyanosis, Clubbing or edema, No new Rash or bruise       Data Review:    CBC Recent Labs  Lab 02/16/20 1725 02/17/20 0331 02/18/20 0207  WBC 16.1* 10.8* 13.2*  HGB 12.4* 10.4* 10.3*  HCT 40.0 32.1* 32.2*  PLT 325 252 262  MCV 82.0 79.1* 80.7  MCH 25.4* 25.6* 25.8*  MCHC 31.0 32.4 32.0  RDW 15.6* 15.4 15.4  LYMPHSABS  --  0.3*  --   MONOABS  --  0.1  --   EOSABS  --  0.0  --   BASOSABS  --  0.0  --     Chemistries  Recent Labs  Lab 02/16/20 1725 02/17/20 0331 02/18/20 0207  NA 139 141 140  K 3.4* 4.1 4.0  CL 95* 98 101  CO2 31 31 30   GLUCOSE 173* 161* 160*  BUN 12 10 20   CREATININE 0.68 0.61 0.67  CALCIUM 8.7* 8.4* 8.4*   ------------------------------------------------------------------------------------------------------------------ No results for input(s): CHOL, HDL, LDLCALC, TRIG, CHOLHDL, LDLDIRECT in the last 72 hours.  No results found for: HGBA1C ------------------------------------------------------------------------------------------------------------------ No results for input(s): TSH, T4TOTAL, T3FREE, THYROIDAB in the last 72 hours.  Invalid input(s): FREET3 ------------------------------------------------------------------------------------------------------------------ No results for input(s): VITAMINB12, FOLATE, FERRITIN, TIBC, IRON, RETICCTPCT in the last 72 hours.  Coagulation profile No results for input(s): INR, PROTIME in the last 168 hours.  No results for input(s): DDIMER in the last 72 hours.  Cardiac Enzymes No results for input(s): CKMB, TROPONINI, MYOGLOBIN in the last 168 hours.  Invalid input(s): CK ------------------------------------------------------------------------------------------------------------------    Component Value Date/Time  BNP 88.9 02/16/2020 1726    Inpatient Medications  Scheduled Meds: . apixaban  5 mg  Oral BID  . atorvastatin  40 mg Oral q1800  . diltiazem  360 mg Oral Daily  . furosemide  40 mg Oral Daily  . guaiFENesin  1,200 mg Oral BID  . insulin aspart  0-9 Units Subcutaneous TID WC  . loratadine  10 mg Oral Daily  . methylPREDNISolone (SOLU-MEDROL) injection  40 mg Intravenous Q12H  . metoprolol tartrate  25 mg Oral BID  . mometasone-formoterol  2 puff Inhalation BID  . roflumilast  250 mcg Oral Daily  . umeclidinium bromide  1 puff Inhalation Daily   Continuous Infusions: PRN Meds:.acetaminophen **OR** acetaminophen, albuterol, doxylamine (Sleep), LORazepam, ondansetron **OR** ondansetron (ZOFRAN) IV, tiZANidine  Micro Results Recent Results (from the past 240 hour(s))  SARS CORONAVIRUS 2 (TAT 6-24 HRS) Nasopharyngeal Nasopharyngeal Swab     Status: None   Collection Time: 02/16/20  9:00 PM   Specimen: Nasopharyngeal Swab  Result Value Ref Range Status   SARS Coronavirus 2 NEGATIVE NEGATIVE Final    Comment: (NOTE) SARS-CoV-2 target nucleic acids are NOT DETECTED. The SARS-CoV-2 RNA is generally detectable in upper and lower respiratory specimens during the acute phase of infection. Negative results do not preclude SARS-CoV-2 infection, do not rule out co-infections with other pathogens, and should not be used as the sole basis for treatment or other patient management decisions. Negative results must be combined with clinical observations, patient history, and epidemiological information. The expected result is Negative. Fact Sheet for Patients: SugarRoll.be Fact Sheet for Healthcare Providers: https://www.woods-mathews.com/ This test is not yet approved or cleared by the [place] FDA and  has been authorized for detection and/or diagnosis of SARS-CoV-2 by FDA under an Emergency Use Authorization (EUA). This EUA will remain  in effect (meaning this test can be used) for the duration of the COVID-19 declaration under  Section 56 4(b)(1) of the Act, 21 U.S.C. section 360bbb-3(b)(1), unless the authorization is terminated or revoked sooner. Performed at [facility], [place] [address], [place], [place] [address]     Radiology Reports DG Chest 2 View  Result Date: 02/16/2020 CLINICAL DATA:  Shortness of breath with chest pain/tightness since this morning. History of COPD. EXAM: CHEST - 2 VIEW COMPARISON:  Radiographs 02/02/2020 and 01/06/2020. CT 02/03/2020. FINDINGS: The heart size and mediastinal contours are stable with mild aortic atherosclerosis. The lungs are hyperinflated but clear. There is no pleural effusion or pneumothorax. Multiple thoracic compression deformities are noted, not significantly changed from recent chest CT. IMPRESSION: Stable chest with evidence of chronic obstructive pulmonary disease. No acute cardiopulmonary process. Electronically Signed   By: Richardean Sale M.D.   On: 02/16/2020 18:47   CT CHEST W CONTRAST  Result Date: 02/03/2020 CLINICAL DATA:  COPD exacerbation. EXAM: CT CHEST WITH CONTRAST TECHNIQUE: Multidetector CT imaging of the chest was performed during intravenous contrast administration. CONTRAST:  36mL OMNIPAQUE IOHEXOL 300 MG/ML  SOLN COMPARISON:  May 28, 2019. FINDINGS: Cardiovascular: Atherosclerosis of thoracic aorta is noted without aneurysm formation. Normal cardiac size. No pericardial effusion. Coronary artery calcifications are noted. Mediastinum/Nodes: No enlarged mediastinal, hilar, or axillary lymph nodes. Thyroid gland, trachea, and esophagus demonstrate no significant findings. Lungs/Pleura: No pneumothorax or pleural effusion is noted. Emphysematous disease is noted in the upper lobes bilaterally. Calcified biapical scarring is noted. No acute consolidative process is noted. Upper Abdomen: No acute abnormality. Musculoskeletal: There is interval development of several old thoracic compression fractures  in the mid thoracic spine since prior exam. No  definite acute fracture is noted. IMPRESSION: 1. Coronary artery calcifications are noted suggesting coronary artery disease. 2. Emphysematous disease is noted in the upper lobes bilaterally. 3. Interval development of several old thoracic compression fractures since prior exam. No definite acute fracture is noted. Aortic Atherosclerosis (ICD10-I70.0). Emphysema (ICD10-J43.9). Electronically Signed   By: Marijo Conception M.D.   On: 02/03/2020 16:36   DG Chest Port 1 View  Result Date: 02/02/2020 CLINICAL DATA:  Cough, COPD EXAM: PORTABLE CHEST 1 VIEW COMPARISON:  01/06/2020 FINDINGS: The lungs are hyperinflated likely secondary to COPD. There is no focal consolidation. There is no pleural effusion or pneumothorax. The heart and mediastinal contours are unremarkable. There is no acute osseous abnormality. IMPRESSION: No active disease. Electronically Signed   By: Kathreen Devoid   On: 02/02/2020 13:10   CT MAXILLOFACIAL WO CONTRAST  Result Date: 02/03/2020 CLINICAL DATA:  74 year old male with respiratory failure. COPD. Productive cough, shortness of breath. EXAM: CT MAXILLOFACIAL WITHOUT CONTRAST TECHNIQUE: Multidetector CT imaging of the maxillofacial structures was performed. Multiplanar CT image reconstructions were also generated. COMPARISON:  Head CT 06/17/2013. FINDINGS: Osseous: Absent dentition. No acute osseous abnormality identified. Lower cervical spine disc and endplate degeneration. Posterior element ankylosis at C2-C3. Orbits: Intact orbital walls. Postoperative changes to both globes, otherwise negative noncontrast orbits soft tissues. Sinuses: Clear aside from isolated mild mucosal thickening or fluid in the left ethmoid air cell (coronal series 9, image 24). Symmetric but mild nasal cavity mucosal thickening, trace retained secretions in the right nasal cavity. Olfactory recesses are pneumatized. Nasal septum is intact. Bilateral tympanic cavities and mastoids are also well pneumatized. Soft  tissues: Negative visible larynx, pharynx, parapharyngeal spaces, retropharyngeal space, sublingual space, submandibular and parotid glands. No upper cervical lymphadenopathy. Calcified bilateral carotid bifurcation atherosclerosis. Limited intracranial: Calcified atherosclerosis at the skull base. Stable and negative for age visible noncontrast brain parenchyma compared to 2014. IMPRESSION: 1. Minimal paranasal sinus mucosal thickening, limited to a left ethmoid air cell. No convincing acute sinusitis. 2. Symmetric but mild nasal cavity mucosal thickening, raising the possibility of mild rhinitis. 3. No other acute findings in the face. Calcified carotid atherosclerosis. Electronically Signed   By: Genevie Ann M.D.   On: 02/03/2020 16:28     Phillips Climes M.D on 02/19/2020 at 2:43 PM  Between 7am to 7pm - Pager - [phone]  After 7pm go to [email or web address] - password [facility]  [facility] -  Office  [phone]

## 2020-02-19 NOTE — Progress Notes (Signed)
Patient able to ambulate 231ft x 2 today. He went without oxygen for the first 185ft both times, desatting down to 86% at the lowest. He then took a standing rest with 3L [place] until he was able to proceed back to the room. He did have shortness of breath and became shaky, but has a steady gait and does not need a walker. He feels good about his progress today and states he is ready to go home tomorrow.

## 2020-02-20 DIAGNOSIS — I480 Paroxysmal atrial fibrillation: Secondary | ICD-10-CM

## 2020-02-20 LAB — GLUCOSE, CAPILLARY: Glucose-Capillary: 239 mg/dL — ABNORMAL HIGH (ref 70–99)

## 2020-02-20 MED ORDER — ACETAMINOPHEN 325 MG PO TABS: 650.0000 mg | ORAL_TABLET | Freq: Four times a day (QID) | ORAL | Status: DC | PRN

## 2020-02-20 MED ORDER — PREDNISONE 10 MG (21) PO TBPK: ORAL_TABLET | ORAL | 0 refills | Status: DC

## 2020-02-20 NOTE — Discharge Summary (Signed)
Paul [name], is a 74 y.o. male  DOB August 20, 1946  MRN [id number].  Admission date:  02/16/2020  Admitting Physician  Rise Patience, MD  Discharge Date:  02/20/2020   Primary MD  [name], MD  Recommendations for primary care physician for things to follow:  -Please check CBC, CMP during next visit. -To follow with pulmonary this Thursday, to decide how much prednisone should he continue with chronically  Admission Diagnosis  COPD exacerbation ([place]) [J44.1] COPD with acute exacerbation ([place]) [J44.1] Acute respiratory failure with hypoxia ([place]) [J96.01]   Discharge Diagnosis  COPD exacerbation ([place]) [J44.1] COPD with acute exacerbation ([place]) [J44.1] Acute respiratory failure with hypoxia ([place]) [J96.01]    Principal Problem:   COPD with acute exacerbation ([place]) Active Problems:   Paroxysmal atrial fibrillation ([place])   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[place])   Essential hypertension   Acute respiratory failure with hypoxia ([place])      Past Medical History:  Diagnosis Date  . A-fib ([place])   . Anemia   . Atrial fibrillation ([place])   . Back pain   . COPD (chronic obstructive pulmonary disease) ([place])   . COPD (chronic obstructive pulmonary disease) ([place])   . Elevated PSA   . GAD (generalized anxiety disorder)   . High cholesterol   . Hypertension   . Impaired fasting glucose   . Osteoporosis     Past Surgical History:  Procedure Laterality Date  . CATARACT EXTRACTION Right   . SKIN SURGERY     shoulders and chest       History of present illness and  Hospital Course:     Kindly see H&P for history of present illness and admission details, please review complete Labs, Consult reports and Test reports for all details in brief  HPI  from the history and physical done on the day of admission 02/16/2020 HPI: Paul [name] is a 74 y.o. male with history of COPD on home O2  and chronic prednisone therapy, A. fib, diabetes mellitus type 2 diastolic dysfunction, hypertension hyperlipidemia recently admitted for COPD exacerbation presents to the ER admits in [facility] with complaints of worsening shortness of breath with wheezing and cough last 24 hours.  Has some chest tightness initially but resolved.  ED Course: In the ER patient was found to be mildly hypoxic wheezing despite nebulizer treatment chest x-ray unremarkable Covid test negative.  BNP was 88.9 WBC count was 16.1 hemoglobin 12.4 creatinine 0.6.  Patient was started on nebulizer treatment IV Solu-Medrol admitted for COPD exacerbation.    Hospital Course   Acute on chronic hypoxic  respiratory failure secondary to COPD exacerbation - Patient on 2 L nasal cannula mainly at bedtime, increased oxygen requirement during hospital stay, requiring 2 L all time, and 4 L with activity, he is with significant wheezing on presentation, treated with IV Solu-Medrol, nebulizer treatment, he was encouraged use incentive spirometry, flutter valve, his dyspnea has improved, patient will be discharged home today on prednisone taper, he is chronically on  prednisone, he has a follow-up with pulmonary this Thursday, instructed him to discuss with pulmonary if his chronic prednisone does need to be adjusted. -Take incentive spirometry and flutter valve home with him and keep using it. -Metoprolol has been changed to bisoprolol by his pulmonary as an outpatient.  History of A. fib and hypertension  - on Cardizem and bisoprolol, on apixaban for anticoagulation  Chronic Diastolic dysfunction  - with last EF measured last EF was 60 to 65% on Lasix. Appears compensated.  Diabetes mellitus type 2  -Controlled with diet.  Mild hypokalemia - repleted  Normocytic Anemia  -At Baseline, continue to monitor   Hyperlipidemia  - on statins     Discharge Condition:  stable   Follow UP  Follow-up Information    Cox,  Kirsten, MD Follow up in 1 week(s).   Specialties: Family Medicine, Interventional Cardiology, Radiology, Anesthesiology Contact information: [address] [place] [address] [phone]             Discharge Instructions  and  Discharge Medications     Discharge Instructions    Discharge instructions   Complete by: As directed    Follow with Primary MD Cox, Kirsten, MD in 7 days   Get CBC, CMP, 2 view Chest X ray checked  by Primary MD next visit.    Activity: As tolerated with Full fall precautions use walker/cane & assistance as needed   Disposition Home    Diet: Heart Healthy  , with feeding assistance and aspiration precautions.  For Heart failure patients - Check your Weight same time everyday, if you gain over 2 pounds, or you develop in leg swelling, experience more shortness of breath or chest pain, call your Primary MD immediately. Follow Cardiac Low Salt Diet and 1.5 lit/day fluid restriction.   On your next visit with your primary care physician please Get Medicines reviewed and adjusted.   Please request your Prim.MD to go over all Hospital Tests and Procedure/Radiological results at the follow up, please get all Hospital records sent to your Prim MD by signing hospital release before you go home.   If you experience worsening of your admission symptoms, develop shortness of breath, life threatening emergency, suicidal or homicidal thoughts you must seek medical attention immediately by calling 911 or calling your MD immediately  if symptoms less severe.  You Must read complete instructions/literature along with all the possible adverse reactions/side effects for all the Medicines you take and that have been prescribed to you. Take any new Medicines after you have completely understood and accpet all the possible adverse reactions/side effects.   Do not drive, operating heavy machinery, perform activities at heights, swimming or participation  in water activities or provide baby sitting services if your were admitted for syncope or siezures until you have seen by Primary MD or a Neurologist and advised to do so again.  Do not drive when taking Pain medications.    Do not take more than prescribed Pain, Sleep and Anxiety Medications  Special Instructions: If you have smoked or chewed Tobacco  in the last 2 yrs please stop smoking, stop any regular Alcohol  and or any Recreational drug use.  Wear Seat belts while driving.   Please note  You were cared for by a hospitalist during your hospital stay. If you have any questions about your discharge medications or the care you received while you were in the hospital after you are discharged, you can call the unit  and asked to speak with the hospitalist on call if the hospitalist that took care of you is not available. Once you are discharged, your primary care physician will handle any further medical issues. Please note that NO REFILLS for any discharge medications will be authorized once you are discharged, as it is imperative that you return to your primary care physician (or establish a relationship with a primary care physician if you do not have one) for your aftercare needs so that they can reassess your need for medications and monitor your lab values.   Increase activity slowly   Complete by: As directed      Allergies as of 02/20/2020      Reactions   Alendronate Other (See Comments)   Jittery/nervous      Medication List    STOP taking these medications   metoprolol tartrate 25 MG tablet Commonly known as: LOPRESSOR   predniSONE 20 MG tablet Commonly known as: DELTASONE Replaced by: predniSONE 10 MG (21) Tbpk tablet     TAKE these medications   acetaminophen 325 MG tablet Commonly known as: TYLENOL Take 2 tablets (650 mg total) by mouth every 6 (six) hours as needed for mild pain (or Fever >/= 101). What changed:   medication strength  how much to  take  reasons to take this   apixaban 5 MG Tabs tablet Commonly known as: ELIQUIS Take 1 tablet (5 mg total) by mouth 2 (two) times daily.   atorvastatin 40 MG tablet Commonly known as: LIPITOR TAKE 1 TABLET(40 MG) BY MOUTH DAILY AFTER SUPPER What changed:   how much to take  how to take this  when to take this  additional instructions   bisoprolol 5 MG tablet Commonly known as: ZEBETA Take 5 mg by mouth daily.   budesonide-formoterol 160-4.5 MCG/ACT inhaler Commonly known as: SYMBICORT Inhale 2 puffs into the lungs 2 (two) times daily.   calcitonin (salmon) 200 UNIT/ACT nasal spray Commonly known as: MIACALCIN/FORTICAL Place 1 spray into alternate nostrils daily.   Calcium 200 MG Tabs Take 600 mg by mouth daily.   cholecalciferol 25 MCG (1000 UNIT) tablet Commonly known as: VITAMIN D3 Take 1,000 Units by mouth daily.   Daliresp 250 MCG Tabs Generic drug: Roflumilast Take 1 tablet by mouth daily.   diltiazem 360 MG 24 hr capsule Commonly known as: Cardizem CD Take 1 capsule (360 mg total) by mouth daily.   escitalopram 20 MG tablet Commonly known as: LEXAPRO Take 20 mg by mouth daily as needed (anxiety).   furosemide 40 MG tablet Commonly known as: LASIX Take 1 tablet (40 mg total) by mouth daily. What changed: how much to take   guaiFENesin 600 MG 12 hr tablet Commonly known as: MUCINEX Take 2 tablets (1,200 mg total) by mouth 2 (two) times daily as needed. What changed: when to take this   loratadine 10 MG tablet Commonly known as: CLARITIN Take 10 mg by mouth daily.   LORazepam 0.5 MG tablet Commonly known as: ATIVAN Take 1 tablet (0.5 mg total) by mouth daily as needed for anxiety.   MELATONIN PO Take 1 capsule by mouth at bedtime as needed (sleep).   predniSONE 10 MG (21) Tbpk tablet Commonly known as: STERAPRED UNI-PAK 21 TAB Please use per package instruct1on Replaces: predniSONE 20 MG tablet   ProAir HFA 108 (90 Base) MCG/ACT  inhaler Generic drug: albuterol Inhale 2 puffs into the lungs every 6 (six) hours as needed for wheezing or shortness of breath.  albuterol (2.5 MG/3ML) 0.083% nebulizer solution Commonly known as: PROVENTIL Take 2.5 mg by nebulization in the morning and at bedtime.   Spiriva Respimat 2.5 MCG/ACT Aers Generic drug: Tiotropium Bromide Monohydrate Inhale 2.5 mcg into the lungs daily.   tiZANidine 2 MG tablet Commonly known as: ZANAFLEX Take 1 tablet (2 mg total) by mouth every 6 (six) hours as needed for muscle spasms.   vitamin C 250 MG tablet Commonly known as: ASCORBIC ACID Take 500 mg by mouth daily.         Diet and Activity recommendation: See Discharge Instructions above   Consults obtained -  None   Major procedures and Radiology Reports - PLEASE review detailed and final reports for all details, in brief -      DG Chest 2 View  Result Date: 02/16/2020 CLINICAL DATA:  Shortness of breath with chest pain/tightness since this morning. History of COPD. EXAM: CHEST - 2 VIEW COMPARISON:  Radiographs 02/02/2020 and 01/06/2020. CT 02/03/2020. FINDINGS: The heart size and mediastinal contours are stable with mild aortic atherosclerosis. The lungs are hyperinflated but clear. There is no pleural effusion or pneumothorax. Multiple thoracic compression deformities are noted, not significantly changed from recent chest CT. IMPRESSION: Stable chest with evidence of chronic obstructive pulmonary disease. No acute cardiopulmonary process. Electronically Signed   By: Richardean Sale M.D.   On: 02/16/2020 18:47   CT CHEST W CONTRAST  Result Date: 02/03/2020 CLINICAL DATA:  COPD exacerbation. EXAM: CT CHEST WITH CONTRAST TECHNIQUE: Multidetector CT imaging of the chest was performed during intravenous contrast administration. CONTRAST:  12mL OMNIPAQUE IOHEXOL 300 MG/ML  SOLN COMPARISON:  May 28, 2019. FINDINGS: Cardiovascular: Atherosclerosis of thoracic aorta is noted without  aneurysm formation. Normal cardiac size. No pericardial effusion. Coronary artery calcifications are noted. Mediastinum/Nodes: No enlarged mediastinal, hilar, or axillary lymph nodes. Thyroid gland, trachea, and esophagus demonstrate no significant findings. Lungs/Pleura: No pneumothorax or pleural effusion is noted. Emphysematous disease is noted in the upper lobes bilaterally. Calcified biapical scarring is noted. No acute consolidative process is noted. Upper Abdomen: No acute abnormality. Musculoskeletal: There is interval development of several old thoracic compression fractures in the mid thoracic spine since prior exam. No definite acute fracture is noted. IMPRESSION: 1. Coronary artery calcifications are noted suggesting coronary artery disease. 2. Emphysematous disease is noted in the upper lobes bilaterally. 3. Interval development of several old thoracic compression fractures since prior exam. No definite acute fracture is noted. Aortic Atherosclerosis (ICD10-I70.0). Emphysema (ICD10-J43.9). Electronically Signed   By: Marijo Conception M.D.   On: 02/03/2020 16:36   DG Chest Port 1 View  Result Date: 02/02/2020 CLINICAL DATA:  Cough, COPD EXAM: PORTABLE CHEST 1 VIEW COMPARISON:  01/06/2020 FINDINGS: The lungs are hyperinflated likely secondary to COPD. There is no focal consolidation. There is no pleural effusion or pneumothorax. The heart and mediastinal contours are unremarkable. There is no acute osseous abnormality. IMPRESSION: No active disease. Electronically Signed   By: Kathreen Devoid   On: 02/02/2020 13:10   CT MAXILLOFACIAL WO CONTRAST  Result Date: 02/03/2020 CLINICAL DATA:  74 year old male with respiratory failure. COPD. Productive cough, shortness of breath. EXAM: CT MAXILLOFACIAL WITHOUT CONTRAST TECHNIQUE: Multidetector CT imaging of the maxillofacial structures was performed. Multiplanar CT image reconstructions were also generated. COMPARISON:  Head CT 06/17/2013. FINDINGS: Osseous:  Absent dentition. No acute osseous abnormality identified. Lower cervical spine disc and endplate degeneration. Posterior element ankylosis at C2-C3. Orbits: Intact orbital walls. Postoperative changes to both globes, otherwise negative  noncontrast orbits soft tissues. Sinuses: Clear aside from isolated mild mucosal thickening or fluid in the left ethmoid air cell (coronal series 9, image 24). Symmetric but mild nasal cavity mucosal thickening, trace retained secretions in the right nasal cavity. Olfactory recesses are pneumatized. Nasal septum is intact. Bilateral tympanic cavities and mastoids are also well pneumatized. Soft tissues: Negative visible larynx, pharynx, parapharyngeal spaces, retropharyngeal space, sublingual space, submandibular and parotid glands. No upper cervical lymphadenopathy. Calcified bilateral carotid bifurcation atherosclerosis. Limited intracranial: Calcified atherosclerosis at the skull base. Stable and negative for age visible noncontrast brain parenchyma compared to 2014. IMPRESSION: 1. Minimal paranasal sinus mucosal thickening, limited to a left ethmoid air cell. No convincing acute sinusitis. 2. Symmetric but mild nasal cavity mucosal thickening, raising the possibility of mild rhinitis. 3. No other acute findings in the face. Calcified carotid atherosclerosis. Electronically Signed   By: Genevie Ann M.D.   On: 02/03/2020 16:28    Micro Results     Recent Results (from the past 240 hour(s))  SARS CORONAVIRUS 2 (TAT 6-24 HRS) Nasopharyngeal Nasopharyngeal Swab     Status: None   Collection Time: 02/16/20  9:00 PM   Specimen: Nasopharyngeal Swab  Result Value Ref Range Status   SARS Coronavirus 2 NEGATIVE NEGATIVE Final    Comment: (NOTE) SARS-CoV-2 target nucleic acids are NOT DETECTED. The SARS-CoV-2 RNA is generally detectable in upper and lower respiratory specimens during the acute phase of infection. Negative results do not preclude SARS-CoV-2 infection, do not rule  out co-infections with other pathogens, and should not be used as the sole basis for treatment or other patient management decisions. Negative results must be combined with clinical observations, patient history, and epidemiological information. The expected result is Negative. Fact Sheet for Patients: SugarRoll.be Fact Sheet for Healthcare Providers: https://www.woods-mathews.com/ This test is not yet approved or cleared by the [place] FDA and  has been authorized for detection and/or diagnosis of SARS-CoV-2 by FDA under an Emergency Use Authorization (EUA). This EUA will remain  in effect (meaning this test can be used) for the duration of the COVID-19 declaration under Section 56 4(b)(1) of the Act, 21 U.S.C. section 360bbb-3(b)(1), unless the authorization is terminated or revoked sooner. Performed at [facility], [place] [address] [place], [place] [address]        Today   Subjective:   Yosvani Allard today has no headache,no chest  Or abdominal pain,no new weakness tingling or numbness, dyspnea has improved, cough is minimal. Objective:   Blood pressure (!) 114/40, pulse 75, temperature 97.9 F (36.6 C), temperature source Oral, resp. rate 16, height 5\' 6"  (1.676 m), weight 59.4 kg, SpO2 94 %.  No intake or output data in the 24 hours ending 02/20/20 1138  Exam Awake Alert, Oriented x 3, No new F.N deficits, Normal affect Symmetrical Chest wall movement, Good air movement bilaterally, no wheezing today RRR,No Gallops,Rubs or new Murmurs, No Parasternal Heave +ve B.Sounds, Abd Soft, Non tender, No organomegaly appriciated, No rebound -guarding or rigidity. No Cyanosis, Clubbing or edema, No new Rash or bruise  Data Review   CBC w Diff:  Lab Results  Component Value Date   WBC 13.2 (H) 02/18/2020   HGB 10.3 (L) 02/18/2020   HGB 12.5 (L) 10/08/2019   HCT 32.2 (L) 02/18/2020   HCT 39.3 10/08/2019   PLT 262  02/18/2020   PLT 319 10/08/2019   LYMPHOPCT 3 02/17/2020   MONOPCT 1 02/17/2020   EOSPCT 0 02/17/2020  BASOPCT 0 02/17/2020    CMP:  Lab Results  Component Value Date   NA 140 02/18/2020   NA 141 10/08/2019   K 4.0 02/18/2020   CL 101 02/18/2020   CO2 30 02/18/2020   BUN 20 02/18/2020   BUN 17 10/08/2019   CREATININE 0.67 02/18/2020   PROT 6.0 (L) 02/04/2020   ALBUMIN 3.0 (L) 02/04/2020   BILITOT 0.5 02/04/2020   ALKPHOS 70 02/04/2020   AST 15 02/04/2020   ALT 31 02/04/2020  .   Total Time in preparing paper work, data evaluation and todays exam - 16 minutes  Paul [name] M.D on 02/20/2020 at 11:38 AM  [facility]   Office  [phone]

## 2020-02-21 ENCOUNTER — Telehealth: Payer: Self-pay

## 2020-02-21 NOTE — Telephone Encounter (Signed)
I reached out to Mr Angeli today following a recent hospital admission on 02/16/2020 for COPD Exacerbation and acute respiratory failure with hypoxia.  He was discharged home on 02/20/2020.  Chest X-Ray was unremarkable and COVID test was negative.  While admitted he was on O2 2 LPM all of the time and increasing to 4 LPM with activity.  Patient has an appointment scheduled with pulmonology this week on March 4.  Patient was in agreement that pulmonologist will take care of post discharge follow-ups and plan of care.

## 2020-02-21 NOTE — Consult Note (Signed)
   [facility] Inpatient Consult   02/21/2020  Kanon Rausch Nov 09, 1946 [id number]    Patientwas checked for [facility]) Care Management servicesneeded underhis Medicare/ [employer] benefits, witha 30 day readmission, 31%extreme high risk score forunplanned readmissionand 3 hospitalizations in the last 6 months.  Patient was previously outreached by [facility] RN CM for Transition of care and EMMI follow-up calls.   Our community based plan of care has focused on disease management and community resource support.  Review of medical record and MD notesrevealas: Cassey Bateson a 74 y.o.malewithhistory of COPD on home O2 and chronic prednisone therapy, A. fib, diabetes mellitus type 2 diastolic dysfunction, hypertension hyperlipidemia, recently admitted for COPD exacerbation, presents to the ER, admits in [facility] with complaints of worsening shortness of breath with wheezing and cough.  Patient was admitted for COPD exacerbation.       Patient discharged to home over the weekend.  Was able to talk topatient briefly over his mobile phone.HIPAA information verified. Explained to patientabout [facility] care management services, however, he politely verbalized not needing services for now.  He reports, "I've had it for years", and he voiced not being interested with EMMI COPD calls as well. He states, "No to phone calls".   Patientmadeawareof need for follow-up withprimary care provider post hospitalization.   He endorses primary care provider as [name], MD, with [facility].   For questions andadditional information, please contact:  Aashka Salomone A. Macarthur Lorusso, BSN, RN-BC [facility] Liaison Cell: [phone]

## 2020-02-24 ENCOUNTER — Encounter: Payer: Self-pay | Admitting: Internal Medicine

## 2020-02-24 ENCOUNTER — Other Ambulatory Visit: Payer: Self-pay

## 2020-02-24 ENCOUNTER — Telehealth: Payer: Self-pay | Admitting: Cardiology

## 2020-02-24 ENCOUNTER — Ambulatory Visit: Payer: Medicare Other | Admitting: Internal Medicine

## 2020-02-24 ENCOUNTER — Ambulatory Visit (INDEPENDENT_AMBULATORY_CARE_PROVIDER_SITE_OTHER): Payer: Medicare Other | Admitting: Internal Medicine

## 2020-02-24 DIAGNOSIS — J9611 Chronic respiratory failure with hypoxia: Secondary | ICD-10-CM

## 2020-02-24 DIAGNOSIS — S22060S Wedge compression fracture of T7-T8 vertebra, sequela: Secondary | ICD-10-CM

## 2020-02-24 DIAGNOSIS — J449 Chronic obstructive pulmonary disease, unspecified: Secondary | ICD-10-CM

## 2020-02-24 MED ORDER — SPIRIVA RESPIMAT 2.5 MCG/ACT IN AERS: 2.0000 | INHALATION_SPRAY | Freq: Every day | RESPIRATORY_TRACT | 0 refills | Status: DC

## 2020-02-24 MED ORDER — DILTIAZEM HCL ER BEADS 360 MG PO CP24: 360.0000 mg | ORAL_CAPSULE | Freq: Every day | ORAL | 3 refills | Status: DC

## 2020-02-24 NOTE — Progress Notes (Signed)
Paul [name], male    DOB: 01/30/1946,    MRN: [id number]   Brief patient profile:  60 yowm MM/Quit smoking  02/03/09  with GOLD III spirometry baseline doe x able to golf. Only using 02 at  [facility] with  maint on symbicort/ spiriva and no chronic pred/ albuterol twice daily at most  much worse mid March 2020.   PFT 05/29/16: FVC 2.08 L (86%) FEV1 0.83 L (31%) FEV1/FVC 0.40 FEF 25-75 0.30 L (14%) no bronchodilator response TLC 6.56 L (107%) RV 167% ERV 160% DLCO corrected 60% (hemoglobin 10.9) 07/15/13: FVC 2.68 L (60%) FEV1 1.63 L (53%) FEV1/FVC 0.61 FEF 25-75 0.76 L (26%) 02/04/12: FVC 2.18 L (57%) FEV1 1.09 L (36%) FEV1/FVC 0.50 FEF 25-75 0.33 L (11%)   Admit date: 03/19/2019 Discharge date: 03/25/2019   Recommendations for Outpatient Follow-up:  1. F/u with PCP within a week  for hospital discharge follow up, repeat cbc/bmp at follow up 2. F/u with cardiology for new diagnosis of afib 3. Home 02  Discharge Diagnoses:      Active Hospital Problems   Diagnosis Date Noted  . PAF (paroxysmal atrial fibrillation) ([place])   . Acute on chronic respiratory failure with hypoxia ([place])   . COPD exacerbation ([place]) 03/19/2019         Filed Weights   03/23/19 1700 03/24/19 0503 03/25/19 0528  Weight: 60.2 kg 60 kg 62.3 kg    History of present illness: (per admitting MD Dr Earnest Conroy) PCP:[name], Paul Maxwell, MD Patient coming from:Home  Chief Complaint:Worsening shortness of breath  [name] a 74 y.o.malewith history h/oCOPD, hyperlipidemia whoat baseline was using 2 L O2 via nasal cannula only at night and follows [place] pulmonology (Dr. Vaughan [name]) as outpatient presents with worsening shortness of breath. Patient reports that at baseline he has cough with white phlegm and is able to walk around doing his ADLs without shortness of breath and uses O2 only at night. 2 weeks back he developed productive cough with green phlegm and shortness of breath but no fevers. He  underwent CT chest with contrast on March 18 which was negative for PE or infiltrates. He apparently was given doxycycline/prednisone course at that time which helped him transiently but symptoms worsened again past Monday with shortness of breath on minimum exertion/cough and he noticed his pulse ox was dropping to 74%.He started using 2 L O2 nasal cannula continuous. On Tuesday, he called pulmonary office given persistent symptoms and also pulse oximetry showing O2 sats84% on exertion and 93% at rest in spite of using continuous O2. He was advised by Dr. Vaughan [name] to use 60 mg prednisone x3 days followed by 10 mg taper every 3 days. He was also advised to use nebulizer treatments every 3 hours and continue Symbicort inhaler twice daily. Patient was supposed to start 50 mg prednisone today but woke up feeling extremely short of breath. He states he is also been having bad bouts of cough at night with wheezing and bronchospasm.  He presented to [place] ED where he was noted to be afebrile but hypoxic, required BiPAP for 3 hours, chest x-ray was negative for infiltrates. Patient denies any recent history of travel or known sick contacts with flu positive or coronavirus positive patients. He lives at home with his wife who is recovering from lung cancer and completed chemotherapy 1 month back.  Hospitalist service was called to request direct admission to medical floor but ED to ED transfer facilitated for thorough clinical evaluation prior  to requesting medical bed given coronavirus pandemic. In the ED at [facility], patient has been saturating well on 3 L nasal cannula but appears tachypneic on talking full sentences. He denies any chest pain, denies any fevers or leg swellings.  Hospital Course:  Active Problems:   COPD exacerbation (HCC)   PAF (paroxysmal atrial fibrillation) (HCC)   Acute on chronic respiratory failure with hypoxia (HCC)   Acute onHypoxic respiratory failure,  COPDexacerbation,Progression of severe COPD -Emphysema, with no evidence of superimposed acute cardiopulmonary disease -improving on iv steroids, oral zitrhomax, he finished total of 5 days of zithromax in the hospital, he is discharged on slow prednisone taper.  - o2 dropped to 86% while ambulating on room air, discharged on continuous home o2 ( previously on nighttime o2 only) -he is to follow with Dr Paul [name]    Paroxsymalafib, New diagnosis of afib -he is started on eliquis (CHADS-VASC 2, age and aortic atherosclerosis), cardizem, lasix -cardiology consulted recommend "Diltiazem CD 360. Avoid amiodarone and beta blockers given his underlying lung dz." -outpatient echo per cardiology   Aortic and multivessel coronary atherosclerosis - Changed to high intensity statin, atorvastatin 40mg  PO QD. Check lipids and ALT as outpatient in 3 months.  - ASA 81. Aggressive secondary prevention. - No chest pain.      History of Present Illness  03/29/2019  Pulmonary/ extended post hosp f/u eval/Paul [name]  Re GOLD III copd ? 02 dep now Chief Complaint  Patient presents with  . Hospitalization Follow-up    Breathing has improved some. He has not used his albuterol inhaler or neb since hospital d/c.  Dyspnea:  mb and back ok off 02 sats upper 80s at end  Cough: none Sleep: recliner x 45 degrees = baseline 02 2lpm hs  SABA use: none  Since d/c on symb 160 2bid and spiriva each pm (technique 50% on both, see a/p) On pred 50 mg daily on day as ov rec Plan A = Automatic = symbicort 160 Take 2 puffs first thing in am and then another 2 puffs about 12 hours later.  Spiriva x 2 puffs  right after symbicort  Work on inhaler technique: Plan B = Backup Only use your albuterol inhaler(Proventil/Proair)  as a rescue medication Plan C = Crisis - only use your albuterol nebulizer if you first try Plan B and it fails to help > ok to use the nebulizer up to every 4 hours but if start needing it  regularly call for immediate appointment Plan D = Doctor - call me if B and C not adequate Plan E = ER - go to ER or call 911 if all else fails   02  2lpm at bedtime and adjust to keep it over 90% with activity       06/15/2019  f/u ov/Aitan Rossbach re:  COPD III  maint symb/spiriva on prednisone still 15 mg daily  Chief Complaint  Patient presents with  . Follow-up    Breathing has improved  Dyspnea:  10 min x 3 x daily  Due to back / no hills sats usually lower 90s RA Cough: no  Sleeping: 10 degrees SABA use: none 02: 2lpm hs  rec Prednisone 10 mg with bfast x one week then 1/2 x one week then 1/2 on even days x 2 weeks and stop At any point get worse, increase back to 20 mg daily and start over.   07/26/2019  f/u ov/Inice Sanluis re: copd III/ maint pred 10 mg daily  -tends to flare  on lower doses.  Despite maintenance with Symbicort and Spiriva. Chief Complaint  Patient presents with  . Follow-up    Had increased SOB when weened off pred so started back on 10 mg daily and this has helped. He is using his proair 4 x per wk on average and uses neb once per day.  Dyspnea:  10 -12 min outside and inside slowed both by back and breathing  Cough: better / min mucoid sputum production even in am Sleeping: flat  SABA use: way too much  - does not rechallenge  02: 02 2lpm hs  rec Prednisone 10 mg ceiling and 5 mg (one half) floor for now  Plan A  = symbicort, spiriva and prednisone as above Plan B Only use your albuterol as a rescue medication Plan C  Use the nebulizer up to every 4 hours if Plan B doesn't work      10/26/2019  f/u ov/Donise Woodle re: GOLD III/  Steroid dep since early March 2020 on symb/spiriva and pred 20 mg daily  Chief Complaint  Patient presents with  . Follow-up    Breathing is doing well. He uses his proair once per wk on average. He has not used neb recently.   Dyspnea: walking 1.5 - 3 miles a day  Cough: none  Sleeping: hob up 6 inches mattress wedge  SABA use: as above,  rarely needed 02: 2lpm hs/ sats running in high 80's with activity  rec Plan A = Automatic = Always=    Trelegy one click each am (spiriva/ symbicort)  Plan B = Backup (to supplement plan A, not to replace it) Only use your albuterol inhaler as a rescue medication Plan C = Crisis (instead of Plan B but only if Plan B stops working) - only use your albuterol nebulizer if you first try Plan B  Try prednisone 15 mg daily if possible - increase to 20 mg daily if not doing as well    Televisit  12/27/19 recs Plan A = Automatic = Always=    Trelegy each am  Plan B = Backup (to supplement plan A, not to replace it) Only use your albuterol inhaler as a rescue medication Plan C = Crisis (instead of Plan B but only if Plan B stops working) - only use your albuterol nebulizer if you first try Plan B Plan D = Deltasone  - 2 prednisone = 20 mg / day until better then drop to 15 mg daily as tolerated  Make sure you check your oxygen saturations at highest level of activity to be sure it stays over 90% and adjust upward to maintain this level if needed but remember to turn it back to previous settings when you stop (to conserve your supply).  Late add:  Prilosec 20 mg Take 30- 60 min before your first and last meals of the day until returns and continue off the fosfamax (not taking it anyway)   GERD diet zpak    Admit date: 01/04/2020 Discharge date: 01/11/2020  Admitted From: home Disposition: home  Recommendations for Outpatient Follow-up:  1. Follow up with PCP in 1-2 weeks 2. Keep appointment with Dr. Joya Gaskins as scheduled 3. Please obtain BMP/CBC in one week 4. Please follow up on the following pending results: consider starting prolia  Home Health:no Equipment/Devices:home oxygen, not new     Brief/Interim Summary: Per H &P:Kalib Paul [name] a 74 y.o.malewith medical history significant ofA. fib, COPD, chronic respiratory failure on 2 L home oxygen only at nighttime,  hyperlipidemia  presented with acute exacerbation of chronic obstructive pulmonary disease. Due to the severity of his underlying lung disease with chronic prednisone usage slowly weaned down to TID nebulizers scheduled and to oral prednisone. Had some Pt/OT which caused desaturations throughout hospital stay. Due to increased coughing in the hospital he had new upper back pain. With findings of midline tenderness X-ray ordered with old compression fractures and new compression fracture in t spine. Started calcitonin nasal spray and urged him to consider prolia. He states he tried fosamax and could not tolerate. Overall he continued to improve throughout the hospital course and was discharged home on extended prednisone taper back to home 20 mg daily. Asked to schedule nebulizer treatments TID at home for about 1 week then down to BID if able to tolerate.    Discharge Diagnoses:  Principal Problem:   COPD with acute exacerbation [facility]):   Chronic respiratory failure with hypoxia (HCC)   Paroxysmal atrial fibrillation (HCC)   HLD (hyperlipidemia)   SOB (shortness of breath)   01/27/2020  f/u ov/Nahara Dona re:  Copd with persistent flare since March 2020 remains high dose steroid dep  Chief Complaint  Patient presents with  . Follow-up    Breathing is about the same. He rarely uses his albuterol inhaler. He used neb last 1 wk ago. He has occ cough with green sputum. Currently on 40 mg pred.   Dyspnea:  Across the room  Cough: some before bed / uses flutter light green only using one 600 mg mucinex qam only  but does use flutter Sleeping: wedge blocks SABA use: minimal  02: 2lpm hs / says usually above 90% walking RA rec Stop lopressor and start Bisoprolol 5 mg daily in its place - if blood pressure is too high take twice daily  For cough / congestion > mucinex up to 1200 mg twice daily and use flutter Plan A = Automatic = Always=   Use albuterol neb 1st thing in am and wait 15 minutes and then use Trelegy  Plan B =  Backup (to supplement plan A, not to replace it) Only use your albuterol inhaler  Plan C = Crisis (instead of Plan B but only if Plan B stops working) - only use your albuterol nebulizer if you first try Plan B and it fails to help > ok to use the nebulizer up to every 4 hours but if start needing it regularly call for immediate appointment Prednisone 20 mg daily  - ok to double if doing worse Please schedule a follow up office visit in 4 weeks, sooner if needed  with all medications   Admission date:  02/16/2020   Discharge Date:  02/20/2020   COPD with acute exacerbation (HCC)  Paroxysmal atrial fibrillation ([place])  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[place])  Essential hypertension  Acute respiratory failure with hypoxia ([place])     02/24/2020   Extended post hosp ov/Haddie Bruhl re: copd  On prednisone 30 mg daily to taper to 20 p 3 more days  Dyspnea:  Just walking in house for now but much better Cough: better  Sleeping: wedge under mattress maybe 30 degrees hob ok SABA use: none now  02: sleep on 2lpm/ sats in 90's s supplement   No obvious day to day or daytime variability or assoc excess/ purulent sputum or mucus plugs or hemoptysis or cp or chest tightness, subjective wheeze or overt sinus or hb symptoms.   Sleeping without nocturnal  or early am exacerbation  of respiratory  c/o's or need for noct saba. Also denies any obvious fluctuation of symptoms with weather or environmental changes or other aggravating or alleviating factors except as outlined above   No unusual exposure hx or h/o childhood pna/ asthma or knowledge of premature birth.  Current Allergies, Complete Past Medical History, Past Surgical History, Family History, and Social History were reviewed in [employer] record.  ROS  The following are not active complaints unless bolded Hoarseness, sore throat, dysphagia, dental problems, itching, sneezing,  nasal congestion or discharge of excess  mucus or purulent secretions, ear ache,   fever, chills, sweats, unintended wt loss or wt gain, classically pleuritic or exertional cp,  orthopnea pnd or arm/hand swelling  Or ankle swelling, presyncope, palpitations, abdominal pain, anorexia, nausea, vomiting, diarrhea  or change in bowel habits or change in bladder habits, change in stools or change in urine, dysuria, hematuria,  rash, arthralgias, visual complaints, headache, numbness, weakness or ataxia or problems with walking or coordination,  change in mood or  memory.        Current Meds  Medication Sig  . albuterol (PROAIR HFA) 108 (90 Base) MCG/ACT inhaler Inhale 2 puffs into the lungs every 6 (six) hours as needed for wheezing or shortness of breath.   Marland Kitchen albuterol (PROVENTIL) (2.5 MG/3ML) 0.083% nebulizer solution Take 2.5 mg by nebulization in the morning and at bedtime.  Marland Kitchen apixaban (ELIQUIS) 5 MG TABS tablet Take 1 tablet (5 mg total) by mouth 2 (two) times daily.  Marland Kitchen atorvastatin (LIPITOR) 40 MG tablet TAKE 1 TABLET(40 MG) BY MOUTH DAILY AFTER SUPPER (Patient taking differently: Take 40 mg by mouth daily at 6 PM. )  . bisoprolol (ZEBETA) 5 MG tablet Take 5 mg by mouth daily.  . budesonide-formoterol (SYMBICORT) 160-4.5 MCG/ACT inhaler Inhale 2 puffs into the lungs 2 (two) times daily.  . calcitonin, salmon, (MIACALCIN/FORTICAL) 200 UNIT/ACT nasal spray Place 1 spray into alternate nostrils daily.  . Calcium 200 MG TABS Take 600 mg by mouth daily.  . cholecalciferol (VITAMIN D3) 25 MCG (1000 UNIT) tablet Take 1,000 Units by mouth daily.  Marland Kitchen diltiazem (CARDIZEM CD) 360 MG 24 hr capsule Take 1 capsule (360 mg total) by mouth daily.  . furosemide (LASIX) 40 MG tablet Take 1 tablet (40 mg total) by mouth daily. (Patient taking differently: Take 20 mg by mouth daily. )  . Guaifenesin (MUCINEX MAXIMUM STRENGTH) 1200 MG TB12 Take 1,200 mg by mouth 3 (three) times a week.  . loratadine (CLARITIN) 10 MG tablet Take 10 mg by mouth daily.  Marland Kitchen  omeprazole (PRILOSEC) 20 MG capsule Take 20 mg by mouth in the morning and at bedtime.  . predniSONE (STERAPRED UNI-PAK 21 TAB) 10 MG (21) TBPK tablet Please use per package instruct1on  . Tiotropium Bromide Monohydrate (SPIRIVA RESPIMAT) 2.5 MCG/ACT AERS Inhale 2.5 mcg into the lungs daily.  . vitamin C (ASCORBIC ACID) 250 MG tablet Take 500 mg by mouth daily.               Objective:     ambulatory chronically ill  wm nad    02/24/2020         137  01/27/2020         144 10/26/2019        156  07/26/2019          144   06/15/19 136 lb (61.7 kg)  05/29/19 134 lb 7.7 oz (61 kg)  05/28/19 134 lb 3.2 oz (  60.9 kg)     Vital signs reviewed  02/24/2020  - Note at rest 02 sats  98% on RA     Reports:   Full dentures    HEENT : pt wearing mask not removed for exam due to covid -19 concerns.    NECK :  without JVD/Nodes/TM/ nl carotid upstrokes bilaterally   LUNGS: no acc muscle use,  Mod barrel  contour chest wall with bilateral  Distant bs s audible wheeze and  without cough on insp or exp maneuvers and mod  Hyperresonant  to  percussion bilaterally     CV:  RRR  no s3 or murmur or increase in P2, and no edema   ABD:  soft and nontender with pos mid insp Hoover's  in the supine position. No bruits or organomegaly appreciated, bowel sounds nl  MS:     ext warm without deformities, calf tenderness, cyanosis or clubbing No obvious joint restrictions   SKIN: warm and dry without lesions    NEURO:  alert, approp, nl sensorium with  no motor or cerebellar deficits apparent.          I personally reviewed images and agree with radiology impression as follows:  CXR:   02/16/20 Stable chest with evidence of chronic obstructive pulmonary disease. No acute cardiopulmonary process.  Labs ordered/ reviewed:      Chemistry      Component Value Date/Time   NA 140 02/18/2020 0207   NA 141 10/08/2019 0956   K 4.0 02/18/2020 0207   CL 101 02/18/2020 0207   CO2 30 02/18/2020 0207   BUN  20 02/18/2020 0207   BUN 17 10/08/2019 0956   CREATININE 0.67 02/18/2020 0207      Component Value Date/Time   CALCIUM 8.4 (L) 02/18/2020 0207   ALKPHOS 70 02/04/2020 0440   AST 15 02/04/2020 0440   ALT 31 02/04/2020 0440   BILITOT 0.5 02/04/2020 0440        Lab Results  Component Value Date   WBC 13.2 (H) 02/18/2020   HGB 10.3 (L) 02/18/2020   HCT 32.2 (L) 02/18/2020   MCV 80.7 02/18/2020   PLT 262 02/18/2020      Lab Results  Component Value Date   HGB 10.3 (L) 02/18/2020   HGB 10.4 (L) 02/17/2020   HGB 12.4 (L) 02/16/2020   HGB 12.5 (L) 10/08/2019   HGB 11.3 (L) 06/16/2019      Lab Results  Component Value Date   TSH 2.539 03/22/2019      BNP = 89  02/16/20               Assessment

## 2020-02-24 NOTE — Assessment & Plan Note (Addendum)
Quit smoking 01/2009  02/04/12 Paul [name]: FEV1 36% FVC 57% FEF 25 75    11% -05/29/16  FEV1 0.83 L (31%) FEV1/FVC 0.40 FEF 25-75 0.30 L (14%) no bronchodilator response p ? rx prior, DLCO corrected 60% (hemoglobin 10.9) - 03/29/2019   continue symb 160 2bid and spiriva smi 2 puffs each am  - 04/15/2019 placed on daily pred with ceiling 40 and floor of 10 mg daily and empirical rx for GERD - 04/15/2019 flutter valve training  - alpha one screen 06/15/2019  MM  Level 178 - 06/15/2019  After extensive coaching inhaler device,  effectiveness =    90% > so try to taper pred off using ceiling of 20 if worse > 07/26/2019 changed to ceiling of 10 and floor of 5 mg  - 10/26/2019  After extensive coaching inhaler device,  effectiveness =    90% with elipta and still needing pred 20 mg daily so try trelegy sample > consider breztri if prefers hfa and insurance covers  - 12/27/2019 added ppi bid ac otc for atypical spells of sob  - 01/27/2020 changed lopressor to bisoprolol due to refractory wheeze/ sob on lopressor 100 mg daily - Sinus CT  02/03/20 1. Minimal paranasal sinus mucosal thickening, limited to a left ethmoid air cell. No convincing acute sinusitis. 2. Symmetric but mild nasal cavity mucosal thickening, raising the possibility of mild rhinitis. CT chest 02/03/20 1. Coronary artery calcifications are noted suggesting coronary artery disease. 2. Emphysematous disease is noted in the upper lobes bilaterally. 3. Interval development of several old thoracic compression - 02/24/2020  After extensive coaching inhaler device,  effectiveness =    90% > continue symb/spiriva   Much better p admit with the main change higher dose pred and on bisoprolol instead of lopressor and hfa instead of dpi and much less need for saba.  Advised:  I spent extra time with pt (and wife by speaker phone)  today reviewing appropriate use of albuterol for prn use on exertion with the following points: 1) saba is for relief of sob that does not  improve by walking a slower pace or resting but rather if the pt does not improve after trying this first. 2) If the pt is convinced, as many are, that saba helps recover from activity faster then it's easy to tell if this is the case by re-challenging : ie stop, take the inhaler, then p 5 minutes try the exact same activity (intensity of workload) that just caused the symptoms and see if they are substantially diminished or not after saba 3) if there is an activity that reproducibly causes the symptoms, try the saba 15 min before the activity on alternate days   If in fact the saba really does help, then fine to continue to use it prn but advised may need to look closer at the maintenance regimen being used to achieve better control of airways disease with exertion.    Group D in terms of symptom/risk and laba/lama/ICS  therefore appropriate rx at this point >>>  Continue symb/spiriva and prn saba/ next step is add daliresp if flares with pred taper if tolerates but his wt is already trending down so reluctant to start it at this point even in the starter dose.    In meantime: The goal with a chronic steroid dependent illness is always arriving at the lowest effective dose that controls the disease/symptoms and not accepting a set "formula" which is based on statistics or guidelines that don't always  take into account patient  variability or the natural hx of the dz in every individual patient, which may well vary over time.  For now therefore I recommend the patient maintain 20 mg ceiling and 15 mg floor.   >>> f/u in 6 weeks with all meds in hand using a trust but verify approach to confirm accurate Medication  Reconciliation The principal here is that until we are certain that the  patients are doing what we've asked, it makes no sense to ask them to do more.

## 2020-02-24 NOTE — Telephone Encounter (Signed)
New message    Pt c/o medication issue:  1. Name of Medication: diltiazem (CARDIZEM CD) 360 MG 24 hr capsule  2. How are you currently taking this medication (dosage and times per day)?as written  3. Are you having a reaction (difficulty breathing--STAT)? No  4. What is your medication issue?Patient's wife wants to know if there is a substitute that is less expensive for this medication.

## 2020-02-24 NOTE — Patient Instructions (Addendum)
Make sure you check your oxygen saturations at highest level of activity to be sure it stays over 90% and adjust upward to maintain this level if needed but remember to turn it back to previous settings when you stop (to conserve your supply).   Try albuterol(Proair) 15 min before an activity that you know would make you short of breath and see if it makes any difference and if makes none then don't take it after activity unless you can't catch your breath.  Reduce prednsione to 20 mg daily = 2 x 10 as planned  - if you are doing great ok to try 1.5 = 15 mg daily  If continue to flare then ok to add daliresp 250 mg one daily   For cough / congestion as needed >>  mucinex up 1200mg  every 12 hours but use the flutter valve as much as you can    Please schedule a follow up office visit in 6 weeks, call sooner if needed with all medications /inhalers/ solutions in hand so we can verify exactly what you are taking. This includes all medications from all doctors and over the [place] separate them into two bags:  the ones you take automatically, no matter what, vs the ones you take just when you feel you need them "BAG #2 is UP TO YOU"  - this will really help [place] help you take your medications more effectively.

## 2020-02-24 NOTE — Telephone Encounter (Signed)
Called and spoke with the pharmacist at [facility], [place].  She reports every script she runs for the pt for Diltiazem ER is $222/for a 30 day supply. When looking at the [facility] website - I was able to find a price for Diltiazem ER (Tiazac) 360 mg # 30 $24.80 and # 90 $62.60.  For plain Diltiazem (short acting) 120 mg TID #90 would cost $13/month.   Will call pt and wife to discuss with them. Reviewed information with wife who states they will use the [employer] app for the discount and would prefer gen)Tiazac 360 mg #90.  RX sent into pharmacy.  She will c/b if further questions or needs.

## 2020-02-24 NOTE — Assessment & Plan Note (Signed)
02 hs since 2013 02/06/12 ONO RA: desat <88% 7 episodes per hour, 66 episodes total 01/27/2013 ONO RA desat < 88% 57min of recording , still needs nocturnal oxygen - 03/25/2019 d/c from admit on 2lpm but not using x at hs  - 04/15/2019 02 sats 94% at rest despite flare in copd so rec 2lpm  Hs and prn activity  to keep sats > 90% at all times   As of 02/24/2020   2lpm hs and prn daytime > goal is to keep sats > 90% esp with exertion

## 2020-02-24 NOTE — Assessment & Plan Note (Addendum)
T8 compression fracture noted on CT scan 05/28/2019 inpatient with chronic steroid dependent COPD - rx calcitonin nasal spray rx per PCP          Each maintenance medication was reviewed in detail including emphasizing most importantly the difference between maintenance and prns and under what circumstances the prns are to be triggered using an action plan format where appropriate.  Total time for H and P, chart review inclusing extensive inpt records, counseling, teaching hfa device with teach back method  and generating customized AVS unique to this tansition of care office visit / charting = 40 min

## 2020-02-27 NOTE — Addendum Note (Signed)
Addended by: Christinia Gully B on: 02/27/2020 07:20 AM   Modules accepted: Level of Service

## 2020-03-02 ENCOUNTER — Other Ambulatory Visit: Payer: Self-pay

## 2020-03-02 ENCOUNTER — Ambulatory Visit (INDEPENDENT_AMBULATORY_CARE_PROVIDER_SITE_OTHER): Payer: Medicare Other

## 2020-03-02 DIAGNOSIS — M8000XD Age-related osteoporosis with current pathological fracture, unspecified site, subsequent encounter for fracture with routine healing: Secondary | ICD-10-CM

## 2020-03-02 MED ORDER — DENOSUMAB 60 MG/ML ~~LOC~~ SOSY
60.0000 mg | PREFILLED_SYRINGE | Freq: Once | SUBCUTANEOUS | Status: AC
  Administered 2020-03-02: 60 mg via SUBCUTANEOUS

## 2020-03-02 NOTE — Progress Notes (Signed)
Paul [name] comes in to begin prolia.

## 2020-03-04 ENCOUNTER — Inpatient Hospital Stay (HOSPITAL_BASED_OUTPATIENT_CLINIC_OR_DEPARTMENT_OTHER)
Admission: EM | Admit: 2020-03-04 | Discharge: 2020-03-09 | DRG: 190 | Disposition: A | Discharge status: Home / Self Care | Payer: Medicare Other | Source: Ambulatory Visit | Attending: Family Medicine | Admitting: Family Medicine

## 2020-03-04 ENCOUNTER — Other Ambulatory Visit: Payer: Self-pay

## 2020-03-04 ENCOUNTER — Encounter (HOSPITAL_BASED_OUTPATIENT_CLINIC_OR_DEPARTMENT_OTHER): Payer: Self-pay | Admitting: Emergency Medicine

## 2020-03-04 ENCOUNTER — Emergency Department (HOSPITAL_BASED_OUTPATIENT_CLINIC_OR_DEPARTMENT_OTHER): Payer: Medicare Other

## 2020-03-04 DIAGNOSIS — Z20822 Contact with and (suspected) exposure to covid-19: Secondary | ICD-10-CM | POA: Diagnosis present

## 2020-03-04 DIAGNOSIS — Z87891 Personal history of nicotine dependence: Secondary | ICD-10-CM

## 2020-03-04 DIAGNOSIS — J9621 Acute and chronic respiratory failure with hypoxia: Secondary | ICD-10-CM | POA: Diagnosis present

## 2020-03-04 DIAGNOSIS — E782 Mixed hyperlipidemia: Secondary | ICD-10-CM | POA: Diagnosis present

## 2020-03-04 DIAGNOSIS — I110 Hypertensive heart disease with heart failure: Secondary | ICD-10-CM | POA: Diagnosis present

## 2020-03-04 DIAGNOSIS — Z7901 Long term (current) use of anticoagulants: Secondary | ICD-10-CM

## 2020-03-04 DIAGNOSIS — J449 Chronic obstructive pulmonary disease, unspecified: Secondary | ICD-10-CM | POA: Diagnosis not present

## 2020-03-04 DIAGNOSIS — Z9981 Dependence on supplemental oxygen: Secondary | ICD-10-CM

## 2020-03-04 DIAGNOSIS — J441 Chronic obstructive pulmonary disease with (acute) exacerbation: Principal | ICD-10-CM | POA: Diagnosis present

## 2020-03-04 DIAGNOSIS — F411 Generalized anxiety disorder: Secondary | ICD-10-CM | POA: Diagnosis present

## 2020-03-04 DIAGNOSIS — I5032 Chronic diastolic (congestive) heart failure: Secondary | ICD-10-CM | POA: Diagnosis present

## 2020-03-04 DIAGNOSIS — I480 Paroxysmal atrial fibrillation: Secondary | ICD-10-CM | POA: Diagnosis present

## 2020-03-04 DIAGNOSIS — Z7951 Long term (current) use of inhaled steroids: Secondary | ICD-10-CM | POA: Diagnosis not present

## 2020-03-04 DIAGNOSIS — Z888 Allergy status to other drugs, medicaments and biological substances status: Secondary | ICD-10-CM | POA: Diagnosis not present

## 2020-03-04 DIAGNOSIS — E1165 Type 2 diabetes mellitus with hyperglycemia: Secondary | ICD-10-CM | POA: Diagnosis present

## 2020-03-04 DIAGNOSIS — T380X5A Adverse effect of glucocorticoids and synthetic analogues, initial encounter: Secondary | ICD-10-CM | POA: Diagnosis not present

## 2020-03-04 DIAGNOSIS — Z79899 Other long term (current) drug therapy: Secondary | ICD-10-CM

## 2020-03-04 DIAGNOSIS — D649 Anemia, unspecified: Secondary | ICD-10-CM | POA: Diagnosis not present

## 2020-03-04 DIAGNOSIS — M810 Age-related osteoporosis without current pathological fracture: Secondary | ICD-10-CM | POA: Diagnosis present

## 2020-03-04 DIAGNOSIS — Z8249 Family history of ischemic heart disease and other diseases of the circulatory system: Secondary | ICD-10-CM | POA: Diagnosis not present

## 2020-03-04 DIAGNOSIS — J9611 Chronic respiratory failure with hypoxia: Secondary | ICD-10-CM | POA: Diagnosis present

## 2020-03-04 DIAGNOSIS — D638 Anemia in other chronic diseases classified elsewhere: Secondary | ICD-10-CM | POA: Diagnosis present

## 2020-03-04 DIAGNOSIS — R918 Other nonspecific abnormal finding of lung field: Secondary | ICD-10-CM | POA: Diagnosis present

## 2020-03-04 DIAGNOSIS — R972 Elevated prostate specific antigen [PSA]: Secondary | ICD-10-CM | POA: Diagnosis present

## 2020-03-04 DIAGNOSIS — E1169 Type 2 diabetes mellitus with other specified complication: Secondary | ICD-10-CM | POA: Diagnosis not present

## 2020-03-04 DIAGNOSIS — E785 Hyperlipidemia, unspecified: Secondary | ICD-10-CM | POA: Diagnosis present

## 2020-03-04 DIAGNOSIS — I10 Essential (primary) hypertension: Secondary | ICD-10-CM | POA: Diagnosis not present

## 2020-03-04 DIAGNOSIS — I700 Atherosclerosis of aorta: Secondary | ICD-10-CM | POA: Diagnosis present

## 2020-03-04 DIAGNOSIS — R0602 Shortness of breath: Secondary | ICD-10-CM | POA: Diagnosis not present

## 2020-03-04 LAB — CBC WITH DIFFERENTIAL/PLATELET
Abs Immature Granulocytes: 0.1 10*3/uL — ABNORMAL HIGH (ref 0.00–0.07)
Basophils Absolute: 0 10*3/uL (ref 0.0–0.1)
Basophils Relative: 0 %
Eosinophils Absolute: 0 10*3/uL (ref 0.0–0.5)
Eosinophils Relative: 0 %
HCT: 38.7 % — ABNORMAL LOW (ref 39.0–52.0)
Hemoglobin: 12.2 g/dL — ABNORMAL LOW (ref 13.0–17.0)
Immature Granulocytes: 1 %
Lymphocytes Relative: 2 %
Lymphs Abs: 0.3 10*3/uL — ABNORMAL LOW (ref 0.7–4.0)
MCH: 26.1 pg (ref 26.0–34.0)
MCHC: 31.5 g/dL (ref 30.0–36.0)
MCV: 82.7 fL (ref 80.0–100.0)
Monocytes Absolute: 0.3 10*3/uL (ref 0.1–1.0)
Monocytes Relative: 2 %
Neutro Abs: 15.3 10*3/uL — ABNORMAL HIGH (ref 1.7–7.7)
Neutrophils Relative %: 95 %
Platelets: 315 10*3/uL (ref 150–400)
RBC: 4.68 MIL/uL (ref 4.22–5.81)
RDW: 16.4 % — ABNORMAL HIGH (ref 11.5–15.5)
WBC: 16.2 10*3/uL — ABNORMAL HIGH (ref 4.0–10.5)
nRBC: 0 % (ref 0.0–0.2)

## 2020-03-04 LAB — BASIC METABOLIC PANEL
Anion gap: 14 (ref 5–15)
BUN: 14 mg/dL (ref 8–23)
CO2: 31 mmol/L (ref 22–32)
Calcium: 8.4 mg/dL — ABNORMAL LOW (ref 8.9–10.3)
Chloride: 94 mmol/L — ABNORMAL LOW (ref 98–111)
Creatinine, Ser: 0.81 mg/dL (ref 0.61–1.24)
GFR calc Af Amer: >60 mL/min (ref >60)
GFR calc non Af Amer: >60 mL/min (ref >60)
Glucose, Bld: 181 mg/dL — ABNORMAL HIGH (ref 70–99)
Potassium: 3.4 mmol/L — ABNORMAL LOW (ref 3.5–5.1)
Sodium: 139 mmol/L (ref 135–145)

## 2020-03-04 LAB — RESPIRATORY PANEL BY RT PCR (FLU A&B, COVID)
Influenza A by PCR: NEGATIVE
Influenza B by PCR: NEGATIVE
SARS Coronavirus 2 by RT PCR: NEGATIVE

## 2020-03-04 LAB — TROPONIN I (HIGH SENSITIVITY)
Troponin I (High Sensitivity): 7 ng/L (ref <18)
Troponin I (High Sensitivity): 5 ng/L (ref <18)

## 2020-03-04 LAB — HEMOGLOBIN A1C
Hgb A1c MFr Bld: 7.3 % — ABNORMAL HIGH (ref 4.8–5.6)
Mean Plasma Glucose: 162.81 mg/dL

## 2020-03-04 LAB — BRAIN NATRIURETIC PEPTIDE: B Natriuretic Peptide: 82.1 pg/mL (ref 0.0–100.0)

## 2020-03-04 LAB — GLUCOSE, CAPILLARY
Glucose-Capillary: 241 mg/dL — ABNORMAL HIGH (ref 70–99)
Glucose-Capillary: 213 mg/dL — ABNORMAL HIGH (ref 70–99)

## 2020-03-04 MED ORDER — FUROSEMIDE 20 MG PO TABS
20.0000 mg | ORAL_TABLET | Freq: Every day | ORAL | Status: DC
  Administered 2020-03-05 – 2020-03-09 (×5): 20 mg via ORAL
  Filled 2020-03-04 (×5): qty 1

## 2020-03-04 MED ORDER — METHYLPREDNISOLONE SODIUM SUCC 125 MG IJ SOLR
125.0000 mg | Freq: Once | INTRAMUSCULAR | Status: AC
  Administered 2020-03-04: 125 mg via INTRAVENOUS
  Filled 2020-03-04: qty 2

## 2020-03-04 MED ORDER — MAGNESIUM SULFATE 2 GM/50ML IV SOLN
2.0000 g | Freq: Once | INTRAVENOUS | Status: AC
  Administered 2020-03-04: 14:00:00 2 g via INTRAVENOUS
  Filled 2020-03-04: qty 50

## 2020-03-04 MED ORDER — SENNOSIDES-DOCUSATE SODIUM 8.6-50 MG PO TABS: 1.0000 | ORAL_TABLET | Freq: Every evening | ORAL | Status: DC | PRN

## 2020-03-04 MED ORDER — SODIUM CHLORIDE 0.9 % IV SOLN
INTRAVENOUS | Status: DC | PRN
  Administered 2020-03-04: 250 mL via INTRAVENOUS

## 2020-03-04 MED ORDER — ACETAMINOPHEN 325 MG PO TABS: 650.0000 mg | ORAL_TABLET | Freq: Four times a day (QID) | ORAL | Status: DC | PRN

## 2020-03-04 MED ORDER — DILTIAZEM HCL ER BEADS 240 MG PO CP24: 360.0000 mg | ORAL_CAPSULE | Freq: Every day | ORAL | Status: DC

## 2020-03-04 MED ORDER — METHYLPREDNISOLONE SODIUM SUCC 125 MG IJ SOLR
60.0000 mg | Freq: Four times a day (QID) | INTRAMUSCULAR | Status: DC
  Administered 2020-03-04 – 2020-03-09 (×19): 60 mg via INTRAVENOUS
  Filled 2020-03-04 (×19): qty 2

## 2020-03-04 MED ORDER — GUAIFENESIN ER 600 MG PO TB12: 1200.0000 mg | ORAL_TABLET | ORAL | Status: DC

## 2020-03-04 MED ORDER — PANTOPRAZOLE SODIUM 40 MG PO TBEC
40.0000 mg | DELAYED_RELEASE_TABLET | Freq: Every day | ORAL | Status: DC
  Administered 2020-03-05: 40 mg via ORAL
  Filled 2020-03-04: qty 1

## 2020-03-04 MED ORDER — ONDANSETRON HCL 4 MG PO TABS: 4.0000 mg | ORAL_TABLET | Freq: Four times a day (QID) | ORAL | Status: DC | PRN

## 2020-03-04 MED ORDER — DILTIAZEM HCL ER COATED BEADS 180 MG PO CP24
360.0000 mg | ORAL_CAPSULE | Freq: Every day | ORAL | Status: DC
  Administered 2020-03-05 – 2020-03-09 (×5): 360 mg via ORAL
  Filled 2020-03-04 (×5): qty 2

## 2020-03-04 MED ORDER — APIXABAN 5 MG PO TABS
5.0000 mg | ORAL_TABLET | Freq: Two times a day (BID) | ORAL | Status: DC
  Administered 2020-03-04 – 2020-03-09 (×10): 5 mg via ORAL
  Filled 2020-03-04 (×10): qty 1

## 2020-03-04 MED ORDER — BISOPROLOL FUMARATE 5 MG PO TABS
5.0000 mg | ORAL_TABLET | Freq: Every day | ORAL | Status: DC
  Administered 2020-03-05 – 2020-03-09 (×5): 5 mg via ORAL
  Filled 2020-03-04 (×5): qty 1

## 2020-03-04 MED ORDER — ATORVASTATIN CALCIUM 40 MG PO TABS
40.0000 mg | ORAL_TABLET | Freq: Every day | ORAL | Status: DC
  Administered 2020-03-04 – 2020-03-08 (×5): 40 mg via ORAL
  Filled 2020-03-04 (×5): qty 1

## 2020-03-04 MED ORDER — ALBUTEROL (5 MG/ML) CONTINUOUS INHALATION SOLN
10.0000 mg/h | INHALATION_SOLUTION | Freq: Once | RESPIRATORY_TRACT | Status: AC
  Administered 2020-03-04: 10 mg/h via RESPIRATORY_TRACT
  Filled 2020-03-04: qty 20

## 2020-03-04 MED ORDER — CALCITONIN (SALMON) 200 UNIT/ACT NA SOLN: 1.0000 | Freq: Every day | NASAL | Status: DC

## 2020-03-04 MED ORDER — IPRATROPIUM-ALBUTEROL 0.5-2.5 (3) MG/3ML IN SOLN
3.0000 mL | Freq: Four times a day (QID) | RESPIRATORY_TRACT | Status: DC
  Administered 2020-03-04 – 2020-03-06 (×6): 3 mL via RESPIRATORY_TRACT
  Filled 2020-03-04 (×7): qty 3

## 2020-03-04 MED ORDER — LORATADINE 10 MG PO TABS
10.0000 mg | ORAL_TABLET | Freq: Every day | ORAL | Status: DC
  Administered 2020-03-05 – 2020-03-09 (×5): 10 mg via ORAL
  Filled 2020-03-04 (×5): qty 1

## 2020-03-04 MED ORDER — MELATONIN 5 MG PO TABS
10.0000 mg | ORAL_TABLET | Freq: Every evening | ORAL | Status: DC | PRN
  Administered 2020-03-04 – 2020-03-08 (×5): 10 mg via ORAL
  Filled 2020-03-04 (×5): qty 2

## 2020-03-04 MED ORDER — MOMETASONE FURO-FORMOTEROL FUM 200-5 MCG/ACT IN AERO
2.0000 | INHALATION_SPRAY | Freq: Two times a day (BID) | RESPIRATORY_TRACT | Status: DC
  Administered 2020-03-04 – 2020-03-09 (×10): 2 via RESPIRATORY_TRACT
  Filled 2020-03-04: qty 8.8

## 2020-03-04 MED ORDER — ONDANSETRON HCL 4 MG/2ML IJ SOLN: 4.0000 mg | Freq: Four times a day (QID) | INTRAMUSCULAR | Status: DC | PRN

## 2020-03-04 MED ORDER — INSULIN ASPART 100 UNIT/ML ~~LOC~~ SOLN
0.0000 [IU] | Freq: Every day | SUBCUTANEOUS | Status: DC
  Administered 2020-03-04 – 2020-03-07 (×2): 2 [IU] via SUBCUTANEOUS

## 2020-03-04 MED ORDER — TIOTROPIUM BROMIDE MONOHYDRATE 2.5 MCG/ACT IN AERS: 2.5000 ug | INHALATION_SPRAY | Freq: Every day | RESPIRATORY_TRACT | Status: DC

## 2020-03-04 MED ORDER — ACETAMINOPHEN 650 MG RE SUPP: 650.0000 mg | Freq: Four times a day (QID) | RECTAL | Status: DC | PRN

## 2020-03-04 MED ORDER — INSULIN ASPART 100 UNIT/ML ~~LOC~~ SOLN
0.0000 [IU] | Freq: Three times a day (TID) | SUBCUTANEOUS | Status: DC
  Administered 2020-03-04: 5 [IU] via SUBCUTANEOUS
  Administered 2020-03-05 (×2): 2 [IU] via SUBCUTANEOUS
  Administered 2020-03-05 – 2020-03-06 (×4): 3 [IU] via SUBCUTANEOUS
  Administered 2020-03-07: 2 [IU] via SUBCUTANEOUS
  Administered 2020-03-07: 5 [IU] via SUBCUTANEOUS
  Administered 2020-03-07: 2 [IU] via SUBCUTANEOUS
  Administered 2020-03-08: 8 [IU] via SUBCUTANEOUS
  Administered 2020-03-08: 3 [IU] via SUBCUTANEOUS
  Administered 2020-03-08: 2 [IU] via SUBCUTANEOUS
  Administered 2020-03-09 (×2): 3 [IU] via SUBCUTANEOUS

## 2020-03-04 MED ORDER — ALBUTEROL SULFATE (2.5 MG/3ML) 0.083% IN NEBU: 2.5000 mg | INHALATION_SOLUTION | RESPIRATORY_TRACT | Status: DC | PRN

## 2020-03-04 NOTE — H&P (Signed)
History and Physical  Paul [name] [id number] DOB: [birth year] DOA: 03/04/2020   PCP: [name], MD   Patient coming from: Home  Chief Complaint: Shortness of breath  HPI: Paul [name] is a 74 y.o. male with medical history significant for COPD requiring frequent hospital admissions, last being 2/24-2/28, who is on nocturnal 2 L nasal cannula oxygen presented to [facility] today with complaints of shortness of breath.  Patient says that his room air O2 saturation was 84%, he said he has also had a productive cough for the last 2 days, he has had both doses of his COVID-19 vaccination.  In the emergency department, he was found to be hypoxic though exact number was not documented, he was placed on 2 L nasal cannula oxygen saturating in the mid 90s, due to increased work of breathing, he was given IV Solu-Medrol as well as a continuous neb, after which his work of breathing improved.  Due to the significant new oxygen requirement, he is being admitted for further work-up and treatment of his COPD exacerbation.  After admission to [facility], on my interview the patient is on room air, he is saturating 93%.  He has a nonproductive cough, feels congested.  Denies any chest pain, fevers, nausea, vomiting.  Says that he is feeling much better after IV steroids and nebulizer treatment in the emergency department.  Review of Systems: Please see HPI for pertinent positives and negatives. A complete 10 system review of systems are otherwise negative.  Past Medical History:  Diagnosis Date  . A-fib ([place])   . Anemia   . Atrial fibrillation ([place])   . Back pain   . COPD (chronic obstructive pulmonary disease) ([place])   . COPD (chronic obstructive pulmonary disease) ([place])   . Elevated PSA   . GAD (generalized anxiety disorder)   . High cholesterol   . Hypertension   . Impaired fasting glucose   . Osteoporosis    Past Surgical History:  Procedure Laterality Date  . CATARACT  EXTRACTION Right   . SKIN SURGERY     shoulders and chest    Social History:  reports that he quit smoking about 11 years ago. His smoking use included cigarettes. He has a 104.00 pack-year smoking history. He has never used smokeless tobacco. He reports current alcohol use. He reports that he does not use drugs.   Allergies  Allergen Reactions  . Alendronate Other (See Comments)    Jittery/nervous    Family History  Problem Relation Age of Onset  . Heart disease Brother   . Heart disease Mother   . Heart disease Father   . Cancer Paternal Uncle      Prior to Admission medications   Medication Sig Start Date End Date Taking? Authorizing Provider  albuterol (PROAIR HFA) 108 (90 Base) MCG/ACT inhaler Inhale 2 puffs into the lungs every 6 (six) hours as needed for wheezing or shortness of breath.     [provider]  albuterol (PROVENTIL) (2.5 MG/3ML) 0.083% nebulizer solution Take 2.5 mg by nebulization in the morning and at bedtime.    [provider]  apixaban (ELIQUIS) 5 MG TABS tablet Take 1 tablet (5 mg total) by mouth 2 (two) times daily. 12/27/19   Jerline Pain, MD  atorvastatin (LIPITOR) 40 MG tablet TAKE 1 TABLET(40 MG) BY MOUTH DAILY AFTER SUPPER Patient taking differently: Take 40 mg by mouth daily at 6 PM.  12/27/19   Jerline Pain, MD  bisoprolol (  ZEBETA) 5 MG tablet Take 5 mg by mouth daily.    [provider]  budesonide-formoterol (SYMBICORT) 160-4.5 MCG/ACT inhaler Inhale 2 puffs into the lungs 2 (two) times daily.    [provider]  calcitonin, salmon, (MIACALCIN/FORTICAL) 200 UNIT/ACT nasal spray Place 1 spray into alternate nostrils daily.    [provider]  Calcium 200 MG TABS Take 600 mg by mouth daily.    [provider]  cholecalciferol (VITAMIN D3) 25 MCG (1000 UNIT) tablet Take 1,000 Units by mouth daily.    [provider]  diltiazem (TIAZAC) 360 MG 24 hr capsule Take 1 capsule (360 mg total) by  mouth daily. 02/24/20   Jerline Pain, MD  furosemide (LASIX) 40 MG tablet Take 1 tablet (40 mg total) by mouth daily. Patient taking differently: Take 20 mg by mouth daily.  12/27/19 03/26/20  Jerline Pain, MD  Guaifenesin (MUCINEX MAXIMUM STRENGTH) 1200 MG TB12 Take 1,200 mg by mouth 3 (three) times a week.    [provider]  loratadine (CLARITIN) 10 MG tablet Take 10 mg by mouth daily.    [provider]  omeprazole (PRILOSEC) 20 MG capsule Take 20 mg by mouth in the morning and at bedtime.    [provider]  predniSONE (STERAPRED UNI-PAK 21 TAB) 10 MG (21) TBPK tablet Please use per package instruct1on 02/20/20   Elgergawy, Silver Huguenin, MD  Tiotropium Bromide Monohydrate (SPIRIVA RESPIMAT) 2.5 MCG/ACT AERS Inhale 2.5 mcg into the lungs daily.    [provider]  Tiotropium Bromide Monohydrate (SPIRIVA RESPIMAT) 2.5 MCG/ACT AERS Inhale 2 puffs into the lungs daily. 02/24/20   Tanda Rockers, MD  vitamin C (ASCORBIC ACID) 250 MG tablet Take 500 mg by mouth daily.    [provider]    Physical Exam: BP 140/67 (BP Location: Right Arm)   Pulse 94   Temp 98.2 F (36.8 C) (Oral)   Resp 20   Ht 5\' 6"  (1.676 m)   Wt 62.1 kg   SpO2 99%   BMI 22.11 kg/m   General:  Alert, oriented, calm, in no acute distress, speaking in full sentences, Eyes: EOMI, clear conjuctivae, white sclerea Neck: supple, no masses, trachea mildline  Cardiovascular: RRR, no murmurs or rubs, no peripheral edema  Respiratory: Breath sounds are distant, he is in no respiratory distress, no tachypnea, no retractions, no wheezing rhonchi or crackles heard, currently on room air Abdomen: soft, nontender, nondistended, normal bowel tones heard  Skin: dry, no rashes  Musculoskeletal: no joint effusions, normal range of motion  Psychiatric: appropriate affect, normal speech  Neurologic: extraocular muscles intact, clear speech, moving all extremities with intact sensorium              Labs on Admission:  Basic Metabolic Panel: Recent Labs  Lab 03/04/20 1220  NA 139  K 3.4*  CL 94*  CO2 31  GLUCOSE 181*  BUN 14  CREATININE 0.81  CALCIUM 8.4*   Liver Function Tests: No results for input(s): AST, ALT, ALKPHOS, BILITOT, PROT, ALBUMIN in the last 168 hours. No results for input(s): LIPASE, AMYLASE in the last 168 hours. No results for input(s): AMMONIA in the last 168 hours. CBC: Recent Labs  Lab 03/04/20 1220  WBC 16.2*  NEUTROABS 15.3*  HGB 12.2*  HCT 38.7*  MCV 82.7  PLT 315   Cardiac Enzymes: No results for input(s): CKTOTAL, CKMB, CKMBINDEX, TROPONINI in the last 168 hours.  BNP (last 3 results) Recent Labs  02/02/20 1200 02/16/20 1726 03/04/20 1220  BNP 87.4 88.9 82.1    ProBNP (last 3 results) No results for input(s): PROBNP in the last 8760 hours.  CBG: Recent Labs  Lab 03/04/20 1716  GLUCAP 241*    Radiological Exams on Admission: DG Chest Port 1 View  Result Date: 03/04/2020 CLINICAL DATA:  Shortness of breath in a patient with a history of COPD. EXAM: PORTABLE CHEST 1 VIEW COMPARISON:  CT chest 02/03/2020.  PA and lateral chest 02/16/2020. FINDINGS: The lungs are emphysematous but clear. Heart size is normal. No pneumothorax or pleural effusion. Atherosclerosis is noted. IMPRESSION: No acute disease. Emphysema. Atherosclerosis. Electronically Signed   By: Inge Rise M.D.   On: 03/04/2020 12:12   Assessment/Plan Present on Admission: . COPD GOLD  III  . Chronic respiratory failure with hypoxia ([place]) . Paroxysmal atrial fibrillation (HCC) . COPD with acute exacerbation ([place]) . HLD (hyperlipidemia) . 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 Chronic anemia . COPD exacerbation ([place])  Active Problems:   COPD GOLD  III  acute on chronic respiratory failure with hypoxia (HCC)   COPD with acute exacerbation (HCC) -inpatient admission -no evidence of bacterial infection, will hold antibiotics for  now -supplemental oxygen to keep O2 saturation above 90% -given 125 mg Solu-Medrol emergency department, will continue IV steroids 60 mg IV every 6 -scheduled DuoNeb -as needed albuterol    Former cigarette smoker   Paroxysmal atrial fibrillation (HCC)   Chronic anticoagulation -continue bisoprolol, Eliquis    Chronic anemia  Diastolic dysfunction with last EF measured last EF was 60 to 65% on Lasix.  Appears compensated.    HLD (hyperlipidemia) -continue Lipitor    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HCC) -diabetic diet -sliding scale insulin, anticipate hyperglycemia with steroids  DVT prophylaxis: Eliquis  Code Status: Full code  Family Communication: No family present, plan of care discussed in detail with the patient all questions were answered.  Disposition Plan: Discharge home  Consults called: None  Admission status: Inpatient: Given that patient has acute respiratory failure will need close monitoring for any further deterioration  Time spent: 39 minutes  Mehar Sagen Marry Guan MD [facility] Pager [phone]  If 7PM-7AM, please contact night-coverage [email or web address] Password [facility]  03/04/2020, 5:30 PM

## 2020-03-04 NOTE — ED Notes (Signed)
Report provided to [facility], ETA 20 min

## 2020-03-04 NOTE — Progress Notes (Signed)
Received report from [employer].

## 2020-03-04 NOTE — ED Provider Notes (Signed)
[facility] EMERGENCY DEPARTMENT Provider Note   CSN: [id number] Arrival date & time: 03/04/20  1133     History Chief Complaint  Patient presents with  . Shortness of Breath    Strider Paul [name] is a 74 y.o. male.  Pt presents to the ED today with SOB.  The pt has a hx of COPD requiring frequent admissions.  Last admission was from 2/24-28.  The pt said sob and chest congestion started yesterday.  Pt wears 2L oxygen at night, but has been having to wear it all the time.  RA O2 sat 84% at home.  Pt has had a productive cough.  He has had both doses of his Covid vaccine.  No known Covid contacts.        Past Medical History:  Diagnosis Date  . A-fib ([place])   . Anemia   . Atrial fibrillation ([place])   . Back pain   . COPD (chronic obstructive pulmonary disease) ([place])   . COPD (chronic obstructive pulmonary disease) ([place])   . Elevated PSA   . GAD (generalized anxiety disorder)   . High cholesterol   . Hypertension   . Impaired fasting glucose   . Osteoporosis     Patient Active Problem List   Diagnosis Date Noted  . COPD exacerbation ([place]) 03/04/2020  . Acute respiratory failure with hypoxia ([place]) 02/17/2020  . Osteoporosis with current pathological fracture 02/06/2020  . Chronic cough 01/31/2020  . Essential hypertension 01/28/2020  . Encounter for general adult medical examination with abnormal findings 01/25/2020  . Chronic bilateral thoracic back pain 01/25/2020  . 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 HLD (hyperlipidemia) 01/05/2020  . Vertebral compression fracture ([place]) 05/29/2019  . Chronic anemia 05/29/2019  . Pulmonary nodules 05/29/2019  . COPD with acute exacerbation ([place]) 05/28/2019  . Chronic anticoagulation 04/06/2019  . Coronary artery calcification 04/06/2019  . Atherosclerosis of aorta ([place]) 04/06/2019  . Paroxysmal atrial fibrillation (HCC)   . Acute on chronic respiratory failure with hypoxia ([place])   . Lung  nodule < 6cm on CT 05/29/2016  . Former cigarette smoker 12/05/2015  . Chronic respiratory failure with hypoxia ([place]) 01/27/2013  . COPD GOLD  III      Past Surgical History:  Procedure Laterality Date  . CATARACT EXTRACTION Right   . SKIN SURGERY     shoulders and chest       Family History  Problem Relation Age of Onset  . Heart disease Brother   . Heart disease Mother   . Heart disease Father   . Cancer Paternal Uncle     Social History   Tobacco Use  . Smoking status: Former Smoker    Packs/day: 2.00    Years: 52.00    Pack years: 104.00    Types: Cigarettes    Quit date: 02/03/2009    Years since quitting: 11.0  . Smokeless tobacco: Never Used  . Tobacco comment: Counseled to remain smoke free  Substance Use Topics  . Alcohol use: Yes    Alcohol/week: 0.0 standard drinks    Comment: occ  . Drug use: No    Home Medications Prior to Admission medications   Medication Sig Start Date End Date Taking? Authorizing Provider  albuterol (PROAIR HFA) 108 (90 Base) MCG/ACT inhaler Inhale 2 puffs into the lungs every 6 (six) hours as needed for wheezing or shortness of breath.     [provider]  albuterol (PROVENTIL) (2.5 MG/3ML) 0.083% nebulizer solution  Take 2.5 mg by nebulization in the morning and at bedtime.    [provider]  apixaban (ELIQUIS) 5 MG TABS tablet Take 1 tablet (5 mg total) by mouth 2 (two) times daily. 12/27/19   Jerline Pain, MD  atorvastatin (LIPITOR) 40 MG tablet TAKE 1 TABLET(40 MG) BY MOUTH DAILY AFTER SUPPER Patient taking differently: Take 40 mg by mouth daily at 6 PM.  12/27/19   Jerline Pain, MD  bisoprolol (ZEBETA) 5 MG tablet Take 5 mg by mouth daily.    [provider]  budesonide-formoterol (SYMBICORT) 160-4.5 MCG/ACT inhaler Inhale 2 puffs into the lungs 2 (two) times daily.    [provider]  calcitonin, salmon, (MIACALCIN/FORTICAL) 200 UNIT/ACT nasal spray Place 1 spray into alternate nostrils  daily.    [provider]  Calcium 200 MG TABS Take 600 mg by mouth daily.    [provider]  cholecalciferol (VITAMIN D3) 25 MCG (1000 UNIT) tablet Take 1,000 Units by mouth daily.    [provider]  diltiazem (TIAZAC) 360 MG 24 hr capsule Take 1 capsule (360 mg total) by mouth daily. 02/24/20   Jerline Pain, MD  furosemide (LASIX) 40 MG tablet Take 1 tablet (40 mg total) by mouth daily. Patient taking differently: Take 20 mg by mouth daily.  12/27/19 03/26/20  Jerline Pain, MD  Guaifenesin (MUCINEX MAXIMUM STRENGTH) 1200 MG TB12 Take 1,200 mg by mouth 3 (three) times a week.    [provider]  loratadine (CLARITIN) 10 MG tablet Take 10 mg by mouth daily.    [provider]  omeprazole (PRILOSEC) 20 MG capsule Take 20 mg by mouth in the morning and at bedtime.    [provider]  predniSONE (STERAPRED UNI-PAK 21 TAB) 10 MG (21) TBPK tablet Please use per package instruct1on 02/20/20   Elgergawy, Silver Huguenin, MD  Tiotropium Bromide Monohydrate (SPIRIVA RESPIMAT) 2.5 MCG/ACT AERS Inhale 2.5 mcg into the lungs daily.    [provider]  Tiotropium Bromide Monohydrate (SPIRIVA RESPIMAT) 2.5 MCG/ACT AERS Inhale 2 puffs into the lungs daily. 02/24/20   Tanda Rockers, MD  vitamin C (ASCORBIC ACID) 250 MG tablet Take 500 mg by mouth daily.    [provider]    Allergies    Alendronate  Review of Systems   Review of Systems  Respiratory: Positive for cough, shortness of breath and wheezing.   All other systems reviewed and are negative.   Physical Exam Updated Vital Signs BP 137/62   Pulse 91   Temp 98.2 F (36.8 C) (Oral)   Resp 18   Ht 5\' 6"  (1.676 m)   Wt 62.1 kg   SpO2 100%   BMI 22.11 kg/m   Physical Exam Vitals and nursing note reviewed.  Constitutional:      Appearance: He is well-developed.  HENT:     Head: Normocephalic and atraumatic.     Mouth/Throat:     Mouth: Mucous membranes are moist.      Pharynx: Oropharynx is clear.  Eyes:     Extraocular Movements: Extraocular movements intact.     Pupils: Pupils are equal, round, and reactive to light.  Cardiovascular:     Rate and Rhythm: Regular rhythm. Tachycardia present.  Pulmonary:     Effort: Tachypnea, accessory muscle usage and respiratory distress present.     Breath sounds: Wheezing and rhonchi present.  Abdominal:     General: Bowel sounds are normal.     Palpations:  Abdomen is soft.  Musculoskeletal:        General: Normal range of motion.     Cervical back: Normal range of motion and neck supple.  Skin:    General: Skin is warm.     Capillary Refill: Capillary refill takes less than 2 seconds.  Neurological:     General: No focal deficit present.     Mental Status: He is alert and oriented to person, place, and time.  Psychiatric:        Mood and Affect: Mood normal.        Behavior: Behavior normal.     ED Results / Procedures / Treatments   Labs (all labs ordered are listed, but only abnormal results are displayed) Labs Reviewed  BASIC METABOLIC PANEL - Abnormal; Notable for the following components:      Result Value   Potassium 3.4 (*)    Chloride 94 (*)    Glucose, Bld 181 (*)    Calcium 8.4 (*)    All other components within normal limits  CBC WITH DIFFERENTIAL/PLATELET - Abnormal; Notable for the following components:   WBC 16.2 (*)    Hemoglobin 12.2 (*)    HCT 38.7 (*)    RDW 16.4 (*)    Neutro Abs 15.3 (*)    Lymphs Abs 0.3 (*)    Abs Immature Granulocytes 0.10 (*)    All other components within normal limits  RESPIRATORY PANEL BY RT PCR (FLU A&B, COVID)  BRAIN NATRIURETIC PEPTIDE  TROPONIN I (HIGH SENSITIVITY)  TROPONIN I (HIGH SENSITIVITY)    EKG EKG Interpretation  Date/Time:  Saturday March 04 2020 12:13:58 EST Ventricular Rate:  87 PR Interval:    QRS Duration: 89 QT Interval:  356 QTC Calculation: 429 R Axis:   77 Text Interpretation: Sinus rhythm Probable anteroseptal  infarct, old Baseline wander in lead(s) V5 No significant change since last tracing Confirmed by Isla Pence [phone]) on 03/04/2020 12:54:25 PM   Radiology DG Chest Port 1 View  Result Date: 03/04/2020 CLINICAL DATA:  Shortness of breath in a patient with a history of COPD. EXAM: PORTABLE CHEST 1 VIEW COMPARISON:  CT chest 02/03/2020.  PA and lateral chest 02/16/2020. FINDINGS: The lungs are emphysematous but clear. Heart size is normal. No pneumothorax or pleural effusion. Atherosclerosis is noted. IMPRESSION: No acute disease. Emphysema. Atherosclerosis. Electronically Signed   By: Inge Rise M.D.   On: 03/04/2020 12:12    Procedures Procedures (including critical care time)  Medications Ordered in ED Medications  magnesium sulfate IVPB 2 g 50 mL (2 g Intravenous New Bag/Given 03/04/20 1402)  0.9 %  sodium chloride infusion (250 mLs Intravenous New Bag/Given 03/04/20 1401)  methylPREDNISolone sodium succinate (SOLU-MEDROL) 125 mg/2 mL injection 125 mg (125 mg Intravenous Given 03/04/20 1226)  albuterol (PROVENTIL,VENTOLIN) solution continuous neb (10 mg/hr Nebulization Given 03/04/20 1205)    ED Course  I have reviewed the triage vital signs and the nursing notes.  Pertinent labs & imaging results that were available during my care of the patient were reviewed by me and considered in my medical decision making (see chart for details).    MDM Rules/Calculators/A&P                      After solumedrol and a continuous neb, pt's work of breathing has improved, but he is still sob.  Pt's O2 sat in the mid-90s on 2L.  Covid negative.  Pt d/w Dr. Hollice Gong ([facility]) who will  admit.  CRITICAL CARE Performed by: Isla Pence   Total critical care time: 30 minutes  Critical care time was exclusive of separately billable procedures and treating other patients.  Critical care was necessary to treat or prevent imminent or life-threatening deterioration.  Critical care was  time spent personally by me on the following activities: development of treatment plan with patient and/or surrogate as well as nursing, discussions with consultants, evaluation of patient's response to treatment, examination of patient, obtaining history from patient or surrogate, ordering and performing treatments and interventions, ordering and review of laboratory studies, ordering and review of radiographic studies, pulse oximetry and re-evaluation of patient's condition.  Final Clinical Impression(s) / ED Diagnoses Final diagnoses:  COPD exacerbation ([place])  COVID-19 virus not detected    Rx / DC Orders ED Discharge Orders    None       Isla Pence, MD 03/04/20 1436

## 2020-03-04 NOTE — ED Notes (Signed)
Spoke with Bertram Millard at [facility] regarding consult with hospitalist

## 2020-03-04 NOTE — ED Notes (Signed)
Second attempt to provide report to receiving RN unsuccessful.  Secretary [address] made aware [facility] loading for transport.  RN to return call for report.

## 2020-03-04 NOTE — ED Triage Notes (Signed)
Reports increased SOB with chest congestion since yesterday.

## 2020-03-04 NOTE — ED Notes (Signed)
Attempt to call report to unit, receiving nurse not available at this time, left number for return call.

## 2020-03-05 DIAGNOSIS — J441 Chronic obstructive pulmonary disease with (acute) exacerbation: Principal | ICD-10-CM

## 2020-03-05 LAB — CBC
HCT: 33.8 % — ABNORMAL LOW (ref 39.0–52.0)
Hemoglobin: 10.5 g/dL — ABNORMAL LOW (ref 13.0–17.0)
MCH: 25.4 pg — ABNORMAL LOW (ref 26.0–34.0)
MCHC: 31.1 g/dL (ref 30.0–36.0)
MCV: 81.6 fL (ref 80.0–100.0)
Platelets: 262 10*3/uL (ref 150–400)
RBC: 4.14 MIL/uL — ABNORMAL LOW (ref 4.22–5.81)
RDW: 16.2 % — ABNORMAL HIGH (ref 11.5–15.5)
WBC: 8.8 10*3/uL (ref 4.0–10.5)
nRBC: 0 % (ref 0.0–0.2)

## 2020-03-05 LAB — BASIC METABOLIC PANEL
Anion gap: 13 (ref 5–15)
BUN: 16 mg/dL (ref 8–23)
CO2: 30 mmol/L (ref 22–32)
Calcium: 7.9 mg/dL — ABNORMAL LOW (ref 8.9–10.3)
Chloride: 98 mmol/L (ref 98–111)
Creatinine, Ser: 0.51 mg/dL — ABNORMAL LOW (ref 0.61–1.24)
GFR calc Af Amer: >60 mL/min (ref >60)
GFR calc non Af Amer: >60 mL/min (ref >60)
Glucose, Bld: 156 mg/dL — ABNORMAL HIGH (ref 70–99)
Potassium: 3.5 mmol/L (ref 3.5–5.1)
Sodium: 141 mmol/L (ref 135–145)

## 2020-03-05 LAB — GLUCOSE, CAPILLARY
Glucose-Capillary: 156 mg/dL — ABNORMAL HIGH (ref 70–99)
Glucose-Capillary: 124 mg/dL — ABNORMAL HIGH (ref 70–99)
Glucose-Capillary: 135 mg/dL — ABNORMAL HIGH (ref 70–99)
Glucose-Capillary: 182 mg/dL — ABNORMAL HIGH (ref 70–99)

## 2020-03-05 MED ORDER — GUAIFENESIN ER 600 MG PO TB12
1200.0000 mg | ORAL_TABLET | Freq: Two times a day (BID) | ORAL | Status: DC
  Administered 2020-03-05 – 2020-03-09 (×8): 1200 mg via ORAL
  Filled 2020-03-05 (×8): qty 2

## 2020-03-05 MED ORDER — GUAIFENESIN ER 600 MG PO TB12: 600.0000 mg | ORAL_TABLET | Freq: Two times a day (BID) | ORAL | Status: DC

## 2020-03-05 MED ORDER — PANTOPRAZOLE SODIUM 40 MG PO TBEC
40.0000 mg | DELAYED_RELEASE_TABLET | Freq: Two times a day (BID) | ORAL | Status: DC
  Administered 2020-03-05 – 2020-03-09 (×8): 40 mg via ORAL
  Filled 2020-03-05 (×8): qty 1

## 2020-03-05 MED ORDER — AZITHROMYCIN 250 MG PO TABS
500.0000 mg | ORAL_TABLET | Freq: Every day | ORAL | Status: DC
  Administered 2020-03-05 – 2020-03-09 (×5): 500 mg via ORAL
  Filled 2020-03-05 (×5): qty 2

## 2020-03-05 MED ORDER — LORAZEPAM 0.5 MG PO TABS
0.5000 mg | ORAL_TABLET | Freq: Three times a day (TID) | ORAL | Status: DC | PRN
  Administered 2020-03-05 – 2020-03-06 (×2): 0.5 mg via ORAL
  Filled 2020-03-05 (×2): qty 1

## 2020-03-05 NOTE — Progress Notes (Signed)
PROGRESS NOTE    Paul [name]  [id number] DOB: [birth year] DOA: 03/04/2020 PCP: [name], MD   Brief Narrative: 74 year old with past medical history significant for COPD with recurrent admission to the hospital for the same last being from 2/24--2/28 he is on nocturnal 2 L of oxygen at home who presents with worsening cough and congestion.  His oxygen sat was low, 84 on room air.  He has received both doses of Covid vaccine. Patient received IV Solu-Medrol and nebulizer treatment in the ED and he has been admitted for COPD exacerbation.    Assessment & Plan:   Active Problems:   COPD GOLD  III    Chronic respiratory failure with hypoxia (HCC)   Former cigarette smoker   Paroxysmal atrial fibrillation (HCC)   Chronic anticoagulation   COPD with acute exacerbation (HCC)   Chronic anemia   HLD (hyperlipidemia)   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HCC)   COPD exacerbation (HCC)  1-Acute on chronic COPD exacerbation Gold 3He reports; Acute on chronic hypoxic respiratory failure, worsening hypoxemia this admission Continue with IV Solu-Medrol, scheduled nebulizer We will add azithromycin for the inflammatory effect as well Chest PT to help with congestion and cough He is already on PPI twice daily Quit smoking more than 10 years ago. He will require slow prednisone taper at discharge.  He relates that after he started taking the 20 mg of prednisone  That's when his symptoms restarted. Respiratory panel negative.  2-paroxysmal A. fib: Continue with bisoprolol and Eliquis 3-Chronic Diastolic Heart failure;  with last ejection fraction 60% on Lasix continue. Chest x-ray negative for pulmonary edema.  4-Hyperlipidemia; continue Lipitor.  5-Diabetes type 2: Continue with sliding scale insulin.   Estimated body mass index is 22.11 kg/m as calculated from the following:   Height as of this encounter: 5\' 6"  (1.676 m).   Weight as of this encounter: 62.1  kg.   DVT prophylaxis: Eliquis Code Status: Full code Family Communication: Wife at bedside Disposition Plan:  Patient is from: Home Anticipated d/c date: Home pain to 3 days depending on respiratory status Barriers to d/c or necessity for inpatient status: Patient with acute on chronic COPD exacerbation, worsening hypoxemia no stable for discharge  Consultants:   None  Procedures:   none  Antimicrobials:  Azithromycin   Subjective: He reports shortness of breath, inability to cough or expectorate secretions. When he started to cut down  Prednisone doses,  he started to feel worse. He already takes PPI twice daily   Objective: Vitals:   03/05/20 0531 03/05/20 0740 03/05/20 1325 03/05/20 1550  BP: 126/66  (!) 116/58   Pulse: 81  81   Resp: 18  20   Temp: 98.3 F (36.8 C)  97.9 F (36.6 C)   TempSrc: Oral  Oral   SpO2: 99% 93% 96% 90%  Weight:      Height:        Intake/Output Summary (Last 24 hours) at 03/05/2020 1624 Last data filed at 03/05/2020 0533 Gross per 24 hour  Intake 52.17 ml  Output 500 ml  Net -447.83 ml   Filed Weights   03/04/20 1141 03/04/20 1612  Weight: 62.1 kg 62.1 kg    Examination:  General exam: Appears calm and comfortable  Respiratory system: Mild tachypnea, bilateral rhonchorous Cardiovascular system: S1 & S2 heard, RRR. No JVD, murmurs, rubs, gallops or clicks. No pedal edema. Gastrointestinal system: Abdomen is nondistended, soft and nontender. No organomegaly or masses felt. Normal bowel  sounds heard. Central nervous system: Alert and oriented. No focal neurological deficits. Extremities: Symmetric 5 x 5 power. Skin: No rashes, lesions or ulcers   Data Reviewed: I have personally reviewed following labs and imaging studies  CBC: Recent Labs  Lab 03/04/20 1220 03/05/20 0542  WBC 16.2* 8.8  NEUTROABS 15.3*  --   HGB 12.2* 10.5*  HCT 38.7* 33.8*  MCV 82.7 81.6  PLT 315 99991111   Basic Metabolic Panel: Recent Labs  Lab  03/04/20 1220 03/05/20 0542  NA 139 141  K 3.4* 3.5  CL 94* 98  CO2 31 30  GLUCOSE 181* 156*  BUN 14 16  CREATININE 0.81 0.51*  CALCIUM 8.4* 7.9*   GFR: Estimated Creatinine Clearance: 71.2 mL/min (A) (by C-G formula based on SCr of 0.51 mg/dL (L)). Liver Function Tests: No results for input(s): AST, ALT, ALKPHOS, BILITOT, PROT, ALBUMIN in the last 168 hours. No results for input(s): LIPASE, AMYLASE in the last 168 hours. No results for input(s): AMMONIA in the last 168 hours. Coagulation Profile: No results for input(s): INR, PROTIME in the last 168 hours. Cardiac Enzymes: No results for input(s): CKTOTAL, CKMB, CKMBINDEX, TROPONINI in the last 168 hours. BNP (last 3 results) No results for input(s): PROBNP in the last 8760 hours. HbA1C: Recent Labs    03/04/20 1654  HGBA1C 7.3*   CBG: Recent Labs  Lab 03/04/20 1716 03/04/20 2121 03/05/20 0725 03/05/20 1210  GLUCAP 241* 213* 156* 124*   Lipid Profile: No results for input(s): CHOL, HDL, LDLCALC, TRIG, CHOLHDL, LDLDIRECT in the last 72 hours. Thyroid Function Tests: No results for input(s): TSH, T4TOTAL, FREET4, T3FREE, THYROIDAB in the last 72 hours. Anemia Panel: No results for input(s): VITAMINB12, FOLATE, FERRITIN, TIBC, IRON, RETICCTPCT in the last 72 hours. Sepsis Labs: No results for input(s): PROCALCITON, LATICACIDVEN in the last 168 hours.  Recent Results (from the past 240 hour(s))  Respiratory Panel by RT PCR (Flu A&B, Covid) - Nasopharyngeal Swab     Status: None   Collection Time: 03/04/20 12:25 PM   Specimen: Nasopharyngeal Swab  Result Value Ref Range Status   SARS Coronavirus 2 by RT PCR NEGATIVE NEGATIVE Final    Comment: (NOTE) SARS-CoV-2 target nucleic acids are NOT DETECTED. The SARS-CoV-2 RNA is generally detectable in upper respiratoy specimens during the acute phase of infection. The lowest concentration of SARS-CoV-2 viral copies this assay can detect is 131 copies/mL. A negative  result does not preclude SARS-Cov-2 infection and should not be used as the sole basis for treatment or other patient management decisions. A negative result may occur with  improper specimen collection/handling, submission of specimen other than nasopharyngeal swab, presence of viral mutation(s) within the areas targeted by this assay, and inadequate number of viral copies (<131 copies/mL). A negative result must be combined with clinical observations, patient history, and epidemiological information. The expected result is Negative. Fact Sheet for Patients:  PinkCheek.be Fact Sheet for Healthcare Providers:  GravelBags.it This test is not yet ap proved or cleared by the [place] FDA and  has been authorized for detection and/or diagnosis of SARS-CoV-2 by FDA under an Emergency Use Authorization (EUA). This EUA will remain  in effect (meaning this test can be used) for the duration of the COVID-19 declaration under Section 564(b)(1) of the Act, 21 U.S.C. section 360bbb-3(b)(1), unless the authorization is terminated or revoked sooner.    Influenza A by PCR NEGATIVE NEGATIVE Final   Influenza B by PCR NEGATIVE NEGATIVE Final  Comment: (NOTE) The Xpert Xpress SARS-CoV-2/FLU/RSV assay is intended as an aid in  the diagnosis of influenza from Nasopharyngeal swab specimens and  should not be used as a sole basis for treatment. Nasal washings and  aspirates are unacceptable for Xpert Xpress SARS-CoV-2/FLU/RSV  testing. Fact Sheet for Patients: PinkCheek.be Fact Sheet for Healthcare Providers: GravelBags.it This test is not yet approved or cleared by the [place] FDA and  has been authorized for detection and/or diagnosis of SARS-CoV-2 by  FDA under an Emergency Use Authorization (EUA). This EUA will remain  in effect (meaning this test can be used) for the  duration of the  Covid-19 declaration under Section 564(b)(1) of the Act, 21  U.S.C. section 360bbb-3(b)(1), unless the authorization is  terminated or revoked. Performed at [facility], [address], [place], [place] [address]          Radiology Studies: DG Chest [place] 1 View  Result Date: 03/04/2020 CLINICAL DATA:  Shortness of breath in a patient with a history of COPD. EXAM: PORTABLE CHEST 1 VIEW COMPARISON:  CT chest 02/03/2020.  PA and lateral chest 02/16/2020. FINDINGS: The lungs are emphysematous but clear. Heart size is normal. No pneumothorax or pleural effusion. Atherosclerosis is noted. IMPRESSION: No acute disease. Emphysema. Atherosclerosis. Electronically Signed   By: Inge Rise M.D.   On: 03/04/2020 12:12        Scheduled Meds: . apixaban  5 mg Oral BID  . atorvastatin  40 mg Oral q1800  . azithromycin  500 mg Oral Daily  . bisoprolol  5 mg Oral Daily  . diltiazem  360 mg Oral Daily  . furosemide  20 mg Oral Daily  . [START ON 03/06/2020] guaiFENesin  1,200 mg Oral Once per day on Mon Wed Fri  . insulin aspart  0-15 Units Subcutaneous TID WC  . insulin aspart  0-5 Units Subcutaneous QHS  . ipratropium-albuterol  3 mL Nebulization QID  . loratadine  10 mg Oral Daily  . methylPREDNISolone (SOLU-MEDROL) injection  60 mg Intravenous Q6H  . mometasone-formoterol  2 puff Inhalation BID  . pantoprazole  40 mg Oral Daily   Continuous Infusions: . sodium chloride Stopped (03/04/20 1516)     LOS: 1 day    Time spent: 35 minutes,     Gennette Shadix A Marguis Mathieson, MD [facility]   If 7PM-7AM, please contact night-coverage [email or web address]  03/05/2020, 4:24 PM

## 2020-03-06 LAB — GLUCOSE, CAPILLARY
Glucose-Capillary: 152 mg/dL — ABNORMAL HIGH (ref 70–99)
Glucose-Capillary: 162 mg/dL — ABNORMAL HIGH (ref 70–99)
Glucose-Capillary: 170 mg/dL — ABNORMAL HIGH (ref 70–99)
Glucose-Capillary: 103 mg/dL — ABNORMAL HIGH (ref 70–99)

## 2020-03-06 MED ORDER — IPRATROPIUM-ALBUTEROL 0.5-2.5 (3) MG/3ML IN SOLN
3.0000 mL | Freq: Three times a day (TID) | RESPIRATORY_TRACT | Status: DC
  Administered 2020-03-06 – 2020-03-08 (×8): 3 mL via RESPIRATORY_TRACT
  Filled 2020-03-06 (×8): qty 3

## 2020-03-06 NOTE — Progress Notes (Signed)
PROGRESS NOTE    Paul [name]  [id number] DOB: 06/03/1946 DOA: 03/04/2020 PCP: [name], MD   Brief Narrative: 74 year old with past medical history significant for COPD with recurrent admission to the hospital for the same last being from 2/24--2/28 he is on nocturnal 2 L of oxygen at home who presents with worsening cough and congestion.  His oxygen sat was low, 84 on room air.  He has received both doses of Covid vaccine. Patient received IV Solu-Medrol and nebulizer treatment in the ED and he has been admitted for COPD exacerbation.    Assessment & Plan:   Active Problems:   COPD GOLD  III    Chronic respiratory failure with hypoxia (HCC)   Former cigarette smoker   Paroxysmal atrial fibrillation (HCC)   Chronic anticoagulation   COPD with acute exacerbation (HCC)   Chronic anemia   HLD (hyperlipidemia)   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HCC)   COPD exacerbation (HCC)  1-Acute on chronic COPD exacerbation Gold 3He reports; Acute on chronic hypoxic respiratory failure, worsening hypoxemia this admission Continue with IV Solu-Medrol, scheduled nebulizer We will add azithromycin for the inflammatory effect as well Chest PT to help with congestion and cough He is already on PPI twice daily Quit smoking more than 10 years ago. He will require slow prednisone taper at discharge (60 mg for 5 days then 50 mg for 4 days the 40 mg until he sees Dr Melvyn Novas).  He relates that after he started taking the 20 mg of prednisone  That's when his symptoms restarted. Respiratory panel negative. Chest PT helps. Continue with current management.   2-Paroxysmal A. fib: Continue with bisoprolol and Eliquis.  3-Chronic Diastolic Heart failure;  with last ejection fraction 60% on Lasix continue. Chest x-ray negative for pulmonary edema.  4-Hyperlipidemia; Continue Lipitor.  5-Diabetes type 2: Continue with sliding scale insulin.   Estimated body mass index is 22.11 kg/m as  calculated from the following:   Height as of this encounter: 5\' 6"  (1.676 m).   Weight as of this encounter: 62.1 kg.   DVT prophylaxis: Eliquis Code Status: Full code Family Communication: Wife at bedside Disposition Plan:  Patient is from: Home Anticipated d/c date: Home pain to 3 days depending on respiratory status Barriers to d/c or necessity for inpatient status: Patient with acute on chronic COPD exacerbation, worsening hypoxemia no stable for discharge  Consultants:   None  Procedures:   none  Antimicrobials:  Azithromycin   Subjective: He notice some improvement of cough, Chest PT helps with congestion. sputum yellowish.    Objective: Vitals:   03/06/20 0616 03/06/20 0727 03/06/20 1311 03/06/20 1404  BP: 119/69   (!) 110/57  Pulse: 83 81  81  Resp: 17 18  16   Temp: 98.2 F (36.8 C)   97.8 F (36.6 C)  TempSrc: Oral   Oral  SpO2: 97% 94% 92% 92%  Weight:      Height:       No intake or output data in the 24 hours ending 03/06/20 1613 Filed Weights   03/04/20 1141 03/04/20 1612  Weight: 62.1 kg 62.1 kg    Examination:  General exam: NAD Respiratory system: Bilateral [place] Cardiovascular system:  S 1, S 2 RRR Gastrointestinal system: BS present, soft, nt Central nervous system: Alert, non focal.  Extremities: Symmetric power  Data Reviewed: I have personally reviewed following labs and imaging studies  CBC: Recent Labs  Lab 03/04/20 1220 03/05/20 0542  WBC 16.2*  8.8  NEUTROABS 15.3*  --   HGB 12.2* 10.5*  HCT 38.7* 33.8*  MCV 82.7 81.6  PLT 315 99991111   Basic Metabolic Panel: Recent Labs  Lab 03/04/20 1220 03/05/20 0542  NA 139 141  K 3.4* 3.5  CL 94* 98  CO2 31 30  GLUCOSE 181* 156*  BUN 14 16  CREATININE 0.81 0.51*  CALCIUM 8.4* 7.9*   GFR: Estimated Creatinine Clearance: 71.2 mL/min (A) (by C-G formula based on SCr of 0.51 mg/dL (L)). Liver Function Tests: No results for input(s): AST, ALT, ALKPHOS, BILITOT, PROT,  ALBUMIN in the last 168 hours. No results for input(s): LIPASE, AMYLASE in the last 168 hours. No results for input(s): AMMONIA in the last 168 hours. Coagulation Profile: No results for input(s): INR, PROTIME in the last 168 hours. Cardiac Enzymes: No results for input(s): CKTOTAL, CKMB, CKMBINDEX, TROPONINI in the last 168 hours. BNP (last 3 results) No results for input(s): PROBNP in the last 8760 hours. HbA1C: Recent Labs    03/04/20 1654  HGBA1C 7.3*   CBG: Recent Labs  Lab 03/05/20 1210 03/05/20 1652 03/05/20 2205 03/06/20 0755 03/06/20 1118  GLUCAP 124* 135* 182* 152* 162*   Lipid Profile: No results for input(s): CHOL, HDL, LDLCALC, TRIG, CHOLHDL, LDLDIRECT in the last 72 hours. Thyroid Function Tests: No results for input(s): TSH, T4TOTAL, FREET4, T3FREE, THYROIDAB in the last 72 hours. Anemia Panel: No results for input(s): VITAMINB12, FOLATE, FERRITIN, TIBC, IRON, RETICCTPCT in the last 72 hours. Sepsis Labs: No results for input(s): PROCALCITON, LATICACIDVEN in the last 168 hours.  Recent Results (from the past 240 hour(s))  Respiratory Panel by RT PCR (Flu A&B, Covid) - Nasopharyngeal Swab     Status: None   Collection Time: 03/04/20 12:25 PM   Specimen: Nasopharyngeal Swab  Result Value Ref Range Status   SARS Coronavirus 2 by RT PCR NEGATIVE NEGATIVE Final    Comment: (NOTE) SARS-CoV-2 target nucleic acids are NOT DETECTED. The SARS-CoV-2 RNA is generally detectable in upper respiratoy specimens during the acute phase of infection. The lowest concentration of SARS-CoV-2 viral copies this assay can detect is 131 copies/mL. A negative result does not preclude SARS-Cov-2 infection and should not be used as the sole basis for treatment or other patient management decisions. A negative result may occur with  improper specimen collection/handling, submission of specimen other than nasopharyngeal swab, presence of viral mutation(s) within the areas targeted  by this assay, and inadequate number of viral copies (<131 copies/mL). A negative result must be combined with clinical observations, patient history, and epidemiological information. The expected result is Negative. Fact Sheet for Patients:  PinkCheek.be Fact Sheet for Healthcare Providers:  GravelBags.it This test is not yet ap proved or cleared by the [place] FDA and  has been authorized for detection and/or diagnosis of SARS-CoV-2 by FDA under an Emergency Use Authorization (EUA). This EUA will remain  in effect (meaning this test can be used) for the duration of the COVID-19 declaration under Section 564(b)(1) of the Act, 21 U.S.C. section 360bbb-3(b)(1), unless the authorization is terminated or revoked sooner.    Influenza A by PCR NEGATIVE NEGATIVE Final   Influenza B by PCR NEGATIVE NEGATIVE Final    Comment: (NOTE) The Xpert Xpress SARS-CoV-2/FLU/RSV assay is intended as an aid in  the diagnosis of influenza from Nasopharyngeal swab specimens and  should not be used as a sole basis for treatment. Nasal washings and  aspirates are unacceptable for Xpert Xpress SARS-CoV-2/FLU/RSV  testing.  Fact Sheet for Patients: PinkCheek.be Fact Sheet for Healthcare Providers: GravelBags.it This test is not yet approved or cleared by the [place] FDA and  has been authorized for detection and/or diagnosis of SARS-CoV-2 by  FDA under an Emergency Use Authorization (EUA). This EUA will remain  in effect (meaning this test can be used) for the duration of the  Covid-19 declaration under Section 564(b)(1) of the Act, 21  U.S.C. section 360bbb-3(b)(1), unless the authorization is  terminated or revoked. Performed at [facility], [address] [place], [place] [address]          Radiology Studies: No results found.      Scheduled Meds: .  apixaban  5 mg Oral BID  . atorvastatin  40 mg Oral q1800  . azithromycin  500 mg Oral Daily  . bisoprolol  5 mg Oral Daily  . diltiazem  360 mg Oral Daily  . furosemide  20 mg Oral Daily  . guaiFENesin  1,200 mg Oral BID  . insulin aspart  0-15 Units Subcutaneous TID WC  . insulin aspart  0-5 Units Subcutaneous QHS  . ipratropium-albuterol  3 mL Nebulization TID  . loratadine  10 mg Oral Daily  . methylPREDNISolone (SOLU-MEDROL) injection  60 mg Intravenous Q6H  . mometasone-formoterol  2 puff Inhalation BID  . pantoprazole  40 mg Oral BID   Continuous Infusions: . sodium chloride Stopped (03/04/20 1516)     LOS: 2 days    Time spent: 35 minutes,     Stuart Mirabile A Sumedh Shinsato, MD [facility]   If 7PM-7AM, please contact night-coverage [email or web address]  03/06/2020, 4:13 PM

## 2020-03-07 LAB — GLUCOSE, CAPILLARY
Glucose-Capillary: 147 mg/dL — ABNORMAL HIGH (ref 70–99)
Glucose-Capillary: 222 mg/dL — ABNORMAL HIGH (ref 70–99)
Glucose-Capillary: 128 mg/dL — ABNORMAL HIGH (ref 70–99)
Glucose-Capillary: 203 mg/dL — ABNORMAL HIGH (ref 70–99)

## 2020-03-07 NOTE — Evaluation (Signed)
Occupational Therapy Evaluation Patient Details Name: Paul [name] MRN: [id number] DOB: 1946-03-16 Today's Date: 03/07/2020    History of Present Illness Brief Narrative:74 year old with past medical history significant for COPD with recurrent admission to the hospital for the same last being from 2/24--2/28 he is on nocturnal 2 L of oxygen at home who presents with worsening cough and congestion.  His oxygen sat was low, 84 on room air.  He has received both doses of Covid vaccine.Patient received IV Solu-Medrol and nebulizer treatment in the ED and he has been admitted for COPD exacerbation.   Clinical Impression   Pt overall S- mod I with ADL activity,. Pt has his own sat monitor is very aware of deep breathing, taking rest breaks and monitoring sats as well as applying oxygen as needed  Pts sats 85-92 walking in room with out oxygen.  Upon applying oxygen sats increased to 95 (2L)    Follow Up Recommendations  No OT follow up    Equipment Recommendations  None recommended by OT    Recommendations for Other Services       Precautions / Restrictions Precautions Precautions: Fall      Mobility Bed Mobility Overal bed mobility: Independent                Transfers Overall transfer level: Independent                        ADL either performed or assessed with clinical judgement   ADL Overall ADL's : Modified independent                                             Vision Patient Visual Report: No change from baseline           Hand Dominance Right   Extremity/Trunk Assessment Upper Extremity Assessment Upper Extremity Assessment: Generalized weakness           Communication Communication Communication: No difficulties   Cognition Arousal/Alertness: Awake/alert Behavior During Therapy: WFL for tasks assessed/performed Overall Cognitive Status: Within Functional Limits for tasks assessed                                                Home Living Family/patient expects to be discharged to:: Private residence Living Arrangements: Spouse/significant other Available Help at Discharge: Family;Available 24 hours/day Type of Home: House Home Access: Level entry     Home Layout: One level     Bathroom Shower/Tub: Astronomer Accessibility: Yes How Accessible: Accessible via wheelchair Home Equipment: None   Additional Comments: pt reports he wears supplemental O2 at home (2L/min) and has 2 portable tanks and 1 large tank as well. He has no assistive devices for mobility and has never needed to use one.      Prior Functioning/Environment          Comments: wears 2L of O2 at night only                OT Goals(Current goals can be found in the care plan section) Acute Rehab OT Goals Patient Stated Goal: home tomorrow OT Goal Formulation: With patient  OT Frequency:      AM-PAC OT "6 Clicks"  Daily Activity     Outcome Measure Help from another person eating meals?: None Help from another person taking care of personal grooming?: None Help from another person toileting, which includes using toliet, bedpan, or urinal?: None Help from another person bathing (including washing, rinsing, drying)?: None Help from another person to put on and taking off regular upper body clothing?: None Help from another person to put on and taking off regular lower body clothing?: None 6 Click Score: 24   End of Session Nurse Communication: Mobility status  Activity Tolerance: Patient tolerated treatment well Patient left: in chair;with call bell/phone within reach                   Time: 1050-1105 OT Time Calculation (min): 15 min Charges:  OT General Charges $OT Visit: 1 Visit OT Evaluation $OT Eval Low Complexity: 1 Low  Kari Baars, OT Acute Rehabilitation Services [phone] Office- [phone]     Daney Moor, Edwena Felty D 03/07/2020, 11:18 AM

## 2020-03-07 NOTE — Progress Notes (Signed)
PT Screen Note  Patient Details Name: Paul [name] MRN: [id number] DOB: 25-Mar-1946      PT orders received and chart reviewed.  Upon arrival, pt ambulating in hall independently with normal gait pattern.  Reports he walked to nurses station from end of hall 3 x (at least 400') and was monitoring his O2 on his own with sats on 93% on RA at PT arrival.  Reports dropped to 88% one time but stopped and did pursed lip breathing to get back to 90%.  Pt lives in level home, was independent, has O2 at home, and shower chair.  Pt very aware of energy conversation, rest breaks, use of O2, monitoring O2, and pursed lip breathing.  No acute PT indicated. Saw at 1300.  Maggie Font, PT Acute Rehab Services Pager [phone] [facility] [phone]    Karlton Lemon 03/07/2020, 1:02 PM

## 2020-03-07 NOTE — Progress Notes (Signed)
PROGRESS NOTE    Paul [name]  [id number] DOB: [birth year] DOA: 03/04/2020 PCP: [name], MD   Brief Narrative: 74 year old with past medical history significant for COPD with recurrent admission to the hospital for the same last being from 2/24--2/28 he is on nocturnal 2 L of oxygen at home who presents with worsening cough and congestion.  His oxygen sat was low, 84 on room air.  He has received both doses of Covid vaccine. Patient received IV Solu-Medrol and nebulizer treatment in the ED and he has been admitted for COPD exacerbation.    Assessment & Plan:   Active Problems:   COPD GOLD  III    Chronic respiratory failure with hypoxia (HCC)   Former cigarette smoker   Paroxysmal atrial fibrillation (HCC)   Chronic anticoagulation   COPD with acute exacerbation (HCC)   Chronic anemia   HLD (hyperlipidemia)   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HCC)   COPD exacerbation (HCC)  1-Acute on chronic COPD exacerbation Gold 3He reports; Acute on chronic hypoxic respiratory failure, worsening hypoxemia this admission Continue with IV Solu-Medrol, scheduled nebulizer We will add azithromycin for the inflammatory effect as well Chest PT to help with congestion and cough He is already on PPI twice daily Quit smoking more than 10 years ago. He will require slow prednisone taper at discharge (60 mg for 5 days then 50 mg for 4 days the 40 mg until he sees Dr Melvyn Novas).  He relates that after he started taking the 20 mg of prednisone  That's when his symptoms restarted. Respiratory panel negative. Chest PT helps. Continue with current management.  Improving   2-Paroxysmal A. fib: Continue with bisoprolol and Eliquis.  3-Chronic Diastolic Heart failure;  with last ejection fraction 60% on Lasix continue. Chest x-ray negative for pulmonary edema.  4-Hyperlipidemia; Continue Lipitor.  5-Diabetes type 2: Continue with sliding scale insulin.   Estimated body mass index is  22.11 kg/m as calculated from the following:   Height as of this encounter: 5\' 6"  (1.676 m).   Weight as of this encounter: 62.1 kg.   DVT prophylaxis: Eliquis Code Status: Full code Family Communication: Wife at bedside Disposition Plan:  Patient is from: Home Anticipated d/c date: Home on Thursday  depending on respiratory status Barriers to d/c or necessity for inpatient status: Patient with acute on chronic COPD exacerbation, worsening hypoxemia no stable for discharge  Consultants:   None  Procedures:   none  Antimicrobials:  Azithromycin   Subjective: He is slowly improving. He feels Chest PT helps a lot. He is very Patent attorney.   Objective: Vitals:   03/06/20 1404 03/06/20 2043 03/07/20 0544 03/07/20 0757  BP: (!) 110/57 (!) 113/57 115/68   Pulse: 81 74 82 81  Resp: 16 16 16 16   Temp: 97.8 F (36.6 C) (!) 97.4 F (36.3 C) 97.6 F (36.4 C)   TempSrc: Oral Oral Oral   SpO2: 92% 99% 97% 97%  Weight:      Height:        Intake/Output Summary (Last 24 hours) at 03/07/2020 0954 Last data filed at 03/07/2020 0547 Gross per 24 hour  Intake -  Output 400 ml  Net -400 ml   Filed Weights   03/04/20 1141 03/04/20 1612  Weight: 62.1 kg 62.1 kg    Examination:  General exam: NAD Respiratory system: less Bilateral ronchus Cardiovascular system:  S 1, S 2 RRR Gastrointestinal system: BS present, soft, nt Central nervous system: Alert, non focal.  Extremities: symmetric power  Data Reviewed: I have personally reviewed following labs and imaging studies  CBC: Recent Labs  Lab 03/04/20 1220 03/05/20 0542  WBC 16.2* 8.8  NEUTROABS 15.3*  --   HGB 12.2* 10.5*  HCT 38.7* 33.8*  MCV 82.7 81.6  PLT 315 [id number]   Basic Metabolic Panel: Recent Labs  Lab 03/04/20 1220 03/05/20 0542  NA 139 141  K 3.4* 3.5  CL 94* 98  CO2 31 30  GLUCOSE 181* 156*  BUN 14 16  CREATININE 0.81 0.51*  CALCIUM 8.4* 7.9*   GFR: Estimated Creatinine Clearance: 71.2 mL/min  (A) (by C-G formula based on SCr of 0.51 mg/dL (L)). Liver Function Tests: No results for input(s): AST, ALT, ALKPHOS, BILITOT, PROT, ALBUMIN in the last 168 hours. No results for input(s): LIPASE, AMYLASE in the last 168 hours. No results for input(s): AMMONIA in the last 168 hours. Coagulation Profile: No results for input(s): INR, PROTIME in the last 168 hours. Cardiac Enzymes: No results for input(s): CKTOTAL, CKMB, CKMBINDEX, TROPONINI in the last 168 hours. BNP (last 3 results) No results for input(s): PROBNP in the last 8760 hours. HbA1C: Recent Labs    03/04/20 1654  HGBA1C 7.3*   CBG: Recent Labs  Lab 03/06/20 0755 03/06/20 1118 03/06/20 1703 03/06/20 2044 03/07/20 0731  GLUCAP 152* 162* 170* 103* 147*   Lipid Profile: No results for input(s): CHOL, HDL, LDLCALC, TRIG, CHOLHDL, LDLDIRECT in the last 72 hours. Thyroid Function Tests: No results for input(s): TSH, T4TOTAL, FREET4, T3FREE, THYROIDAB in the last 72 hours. Anemia Panel: No results for input(s): VITAMINB12, FOLATE, FERRITIN, TIBC, IRON, RETICCTPCT in the last 72 hours. Sepsis Labs: No results for input(s): PROCALCITON, LATICACIDVEN in the last 168 hours.  Recent Results (from the past 240 hour(s))  Respiratory Panel by RT PCR (Flu A&B, Covid) - Nasopharyngeal Swab     Status: None   Collection Time: 03/04/20 12:25 PM   Specimen: Nasopharyngeal Swab  Result Value Ref Range Status   SARS Coronavirus 2 by RT PCR NEGATIVE NEGATIVE Final    Comment: (NOTE) SARS-CoV-2 target nucleic acids are NOT DETECTED. The SARS-CoV-2 RNA is generally detectable in upper respiratoy specimens during the acute phase of infection. The lowest concentration of SARS-CoV-2 viral copies this assay can detect is 131 copies/mL. A negative result does not preclude SARS-Cov-2 infection and should not be used as the sole basis for treatment or other patient management decisions. A negative result may occur with  improper specimen  collection/handling, submission of specimen other than nasopharyngeal swab, presence of viral mutation(s) within the areas targeted by this assay, and inadequate number of viral copies (<131 copies/mL). A negative result must be combined with clinical observations, patient history, and epidemiological information. The expected result is Negative. Fact Sheet for Patients:  PinkCheek.be Fact Sheet for Healthcare Providers:  GravelBags.it This test is not yet ap proved or cleared by the [place] FDA and  has been authorized for detection and/or diagnosis of SARS-CoV-2 by FDA under an Emergency Use Authorization (EUA). This EUA will remain  in effect (meaning this test can be used) for the duration of the COVID-19 declaration under Section 564(b)(1) of the Act, 21 U.S.C. section 360bbb-3(b)(1), unless the authorization is terminated or revoked sooner.    Influenza A by PCR NEGATIVE NEGATIVE Final   Influenza B by PCR NEGATIVE NEGATIVE Final    Comment: (NOTE) The Xpert Xpress SARS-CoV-2/FLU/RSV assay is intended as an aid in  the diagnosis of influenza from  Nasopharyngeal swab specimens and  should not be used as a sole basis for treatment. Nasal washings and  aspirates are unacceptable for Xpert Xpress SARS-CoV-2/FLU/RSV  testing. Fact Sheet for Patients: PinkCheek.be Fact Sheet for Healthcare Providers: GravelBags.it This test is not yet approved or cleared by the [place] FDA and  has been authorized for detection and/or diagnosis of SARS-CoV-2 by  FDA under an Emergency Use Authorization (EUA). This EUA will remain  in effect (meaning this test can be used) for the duration of the  Covid-19 declaration under Section 564(b)(1) of the Act, 21  U.S.C. section 360bbb-3(b)(1), unless the authorization is  terminated or revoked. Performed at [facility],  [address] [place], [place] [address]          Radiology Studies: No results found.      Scheduled Meds: . apixaban  5 mg Oral BID  . atorvastatin  40 mg Oral q1800  . azithromycin  500 mg Oral Daily  . bisoprolol  5 mg Oral Daily  . diltiazem  360 mg Oral Daily  . furosemide  20 mg Oral Daily  . guaiFENesin  1,200 mg Oral BID  . insulin aspart  0-15 Units Subcutaneous TID WC  . insulin aspart  0-5 Units Subcutaneous QHS  . ipratropium-albuterol  3 mL Nebulization TID  . loratadine  10 mg Oral Daily  . methylPREDNISolone (SOLU-MEDROL) injection  60 mg Intravenous Q6H  . mometasone-formoterol  2 puff Inhalation BID  . pantoprazole  40 mg Oral BID   Continuous Infusions: . sodium chloride Stopped (03/04/20 1516)     LOS: 3 days    Time spent: 35 minutes,     Alicea Wente A Katina Remick, MD [facility]   If 7PM-7AM, please contact night-coverage [email or web address]  03/07/2020, 9:54 AM

## 2020-03-07 NOTE — Care Management Important Message (Signed)
Important Message  Patient Details IM Letter given to Marney Doctor RN Case Manager to present to the Patient Name: Paul [name] MRN: [id number] Date of Birth: [birth year]   Medicare Important Message Given:  Yes     Kerin Salen 03/07/2020, 10:15 AM

## 2020-03-08 DIAGNOSIS — J9611 Chronic respiratory failure with hypoxia: Secondary | ICD-10-CM

## 2020-03-08 DIAGNOSIS — Z7901 Long term (current) use of anticoagulants: Secondary | ICD-10-CM

## 2020-03-08 DIAGNOSIS — D649 Anemia, unspecified: Secondary | ICD-10-CM

## 2020-03-08 DIAGNOSIS — J449 Chronic obstructive pulmonary disease, unspecified: Secondary | ICD-10-CM

## 2020-03-08 LAB — BASIC METABOLIC PANEL
Anion gap: 9 (ref 5–15)
BUN: 18 mg/dL (ref 8–23)
CO2: 30 mmol/L (ref 22–32)
Calcium: 8 mg/dL — ABNORMAL LOW (ref 8.9–10.3)
Chloride: 101 mmol/L (ref 98–111)
Creatinine, Ser: 0.61 mg/dL (ref 0.61–1.24)
GFR calc Af Amer: >60 mL/min (ref >60)
GFR calc non Af Amer: >60 mL/min (ref >60)
Glucose, Bld: 175 mg/dL — ABNORMAL HIGH (ref 70–99)
Potassium: 3.9 mmol/L (ref 3.5–5.1)
Sodium: 140 mmol/L (ref 135–145)

## 2020-03-08 LAB — CBC
HCT: 33.8 % — ABNORMAL LOW (ref 39.0–52.0)
Hemoglobin: 10.6 g/dL — ABNORMAL LOW (ref 13.0–17.0)
MCH: 25.6 pg — ABNORMAL LOW (ref 26.0–34.0)
MCHC: 31.4 g/dL (ref 30.0–36.0)
MCV: 81.6 fL (ref 80.0–100.0)
Platelets: 242 10*3/uL (ref 150–400)
RBC: 4.14 MIL/uL — ABNORMAL LOW (ref 4.22–5.81)
RDW: 16 % — ABNORMAL HIGH (ref 11.5–15.5)
WBC: 10.3 10*3/uL (ref 4.0–10.5)
nRBC: 0 % (ref 0.0–0.2)

## 2020-03-08 LAB — GLUCOSE, CAPILLARY
Glucose-Capillary: 162 mg/dL — ABNORMAL HIGH (ref 70–99)
Glucose-Capillary: 133 mg/dL — ABNORMAL HIGH (ref 70–99)
Glucose-Capillary: 268 mg/dL — ABNORMAL HIGH (ref 70–99)
Glucose-Capillary: 112 mg/dL — ABNORMAL HIGH (ref 70–99)

## 2020-03-08 MED ORDER — IPRATROPIUM-ALBUTEROL 0.5-2.5 (3) MG/3ML IN SOLN
3.0000 mL | Freq: Two times a day (BID) | RESPIRATORY_TRACT | Status: DC
  Administered 2020-03-09: 08:00:00 3 mL via RESPIRATORY_TRACT
  Filled 2020-03-08: qty 3

## 2020-03-08 NOTE — Progress Notes (Signed)
PROGRESS NOTE  Paul [name]  [id number] DOB: 1946/04/24 DOA: 03/04/2020 PCP: [name], MD   Brief Narrative: 74 year old with past medical history significant for COPD with recurrent admission to the hospital for the same last being from 2/24--2/28 he is on nocturnal 2 L of oxygen at home who presents with worsening cough and congestion.  His oxygen sat was low, 84 on room air.  He has received both doses of Covid vaccine. Patient received IV Solu-Medrol and nebulizer treatment in the ED and he has been admitted for COPD exacerbation.  Assessment & Plan: Active Problems:   COPD GOLD  III    Chronic respiratory failure with hypoxia (HCC)   Former cigarette smoker   Paroxysmal atrial fibrillation (HCC)   Chronic anticoagulation   COPD with acute exacerbation (HCC)   Chronic anemia   HLD (hyperlipidemia)   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HCC)   COPD exacerbation (HCC)  Acute on chronic hypoxic respiratory failure due to COPD exacerbation: - Discussed progressive nature of COPD with patient and wife at length. Will follow up with Dr. Melvyn Novas next week. May be progressing toward 24/7 oxygen requirement. Has had multiple readmissions and remains at high risk for this. - Continue sulomedrol with plans to transition to prednisone 60mg  x5 days > 50mg  x5 days > 40mg  etc. until pulmonology follow up.  - Continue nebulized therapies and chest PT.  - Continue azithromycin x5 days.   PAF:  - Continue bisoprolol and eliquis  Chronic HFpEF: No pulmonary edema/exacerbation noted.  - Continue lasix.   HLD: - Continue statin  T2DM:  - Continue SSI  DVT prophylaxis: Eliquis Code Status: Full Family Communication: Wife at bedside Disposition Plan: Patient to return home, has been readmitted multiple times so further education provided today specifically in regards to possible need for around the clock supplemental oxygen with goal SpO2 >88%. Likely to be stable 3/18.    Consultants:   None  Procedures:   None  Antimicrobials:  Azithromycin   Subjective: Shortness of breath and hypoxia down to 83% today when walking to nurses station. This is worse than the previous day. No chest pain. Wheezing occurs intermittently, improved with breathing treatments. Walking trial today was several hours after neb, whereas yesterday it was soon after.  Objective: Vitals:   03/08/20 0644 03/08/20 0730 03/08/20 1258 03/08/20 1452  BP: 126/66  (!) 111/55   Pulse: 74  76   Resp: 16  20   Temp: 97.9 F (36.6 C)  97.9 F (36.6 C)   TempSrc: Oral  Oral   SpO2: 95% 96% 92% 90%  Weight:      Height:        Intake/Output Summary (Last 24 hours) at 03/08/2020 1834 Last data filed at 03/08/2020 0300 Gross per 24 hour  Intake --  Output 275 ml  Net -275 ml   Filed Weights   03/04/20 1141 03/04/20 1612  Weight: 62.1 kg 62.1 kg    Gen: 74 y.o. male in no distress Pulm: Non-labored breathing slightly tachypneic. No wheezing or crackles, diminished  CV: Regular rate and rhythm. No murmur, rub, or gallop. No JVD, no pitting pedal edema. GI: Abdomen soft, non-tender, non-distended, with normoactive bowel sounds. No organomegaly or masses felt. Ext: Warm, no deformities Skin: No rashes, lesions or ulcers Neuro: Alert and oriented. No focal neurological deficits. Psych: Judgement and insight appear normal. Mood & affect appropriate.   Data Reviewed: I have personally reviewed following labs and imaging studies  CBC: Recent Labs  Lab 03/04/20 1220 03/05/20 0542 03/08/20 0502  WBC 16.2* 8.8 10.3  NEUTROABS 15.3*  --   --   HGB 12.2* 10.5* 10.6*  HCT 38.7* 33.8* 33.8*  MCV 82.7 81.6 81.6  PLT 315 262 [id number]   Basic Metabolic Panel: Recent Labs  Lab 03/04/20 1220 03/05/20 0542 03/08/20 0502  NA 139 141 140  K 3.4* 3.5 3.9  CL 94* 98 101  CO2 31 30 30   GLUCOSE 181* 156* 175*  BUN 14 16 18   CREATININE 0.81 0.51* 0.61  CALCIUM 8.4* 7.9* 8.0*    GFR: Estimated Creatinine Clearance: 71.2 mL/min (by C-G formula based on SCr of 0.61 mg/dL). Liver Function Tests: No results for input(s): AST, ALT, ALKPHOS, BILITOT, PROT, ALBUMIN in the last 168 hours. No results for input(s): LIPASE, AMYLASE in the last 168 hours. No results for input(s): AMMONIA in the last 168 hours. Coagulation Profile: No results for input(s): INR, PROTIME in the last 168 hours. Cardiac Enzymes: No results for input(s): CKTOTAL, CKMB, CKMBINDEX, TROPONINI in the last 168 hours. BNP (last 3 results) No results for input(s): PROBNP in the last 8760 hours. HbA1C: No results for input(s): HGBA1C in the last 72 hours. CBG: Recent Labs  Lab 03/07/20 1716 03/07/20 2048 03/08/20 0742 03/08/20 1203 03/08/20 1710  GLUCAP 128* 203* 162* 133* 268*   Lipid Profile: No results for input(s): CHOL, HDL, LDLCALC, TRIG, CHOLHDL, LDLDIRECT in the last 72 hours. Thyroid Function Tests: No results for input(s): TSH, T4TOTAL, FREET4, T3FREE, THYROIDAB in the last 72 hours. Anemia Panel: No results for input(s): VITAMINB12, FOLATE, FERRITIN, TIBC, IRON, RETICCTPCT in the last 72 hours. Urine analysis:    Component Value Date/Time   COLORURINE YELLOW 03/03/2019 1325   APPEARANCEUR CLEAR 03/03/2019 1325   LABSPEC <1.005 (L) 03/03/2019 1325   PHURINE 6.5 03/03/2019 1325   GLUCOSEU NEGATIVE 03/03/2019 1325   HGBUR NEGATIVE 03/03/2019 1325   BILIRUBINUR NEGATIVE 03/03/2019 1325   KETONESUR NEGATIVE 03/03/2019 1325   PROTEINUR NEGATIVE 03/03/2019 1325   NITRITE NEGATIVE 03/03/2019 1325   LEUKOCYTESUR NEGATIVE 03/03/2019 1325   Recent Results (from the past 240 hour(s))  Respiratory Panel by RT PCR (Flu A&B, Covid) - Nasopharyngeal Swab     Status: None   Collection Time: 03/04/20 12:25 PM   Specimen: Nasopharyngeal Swab  Result Value Ref Range Status   SARS Coronavirus 2 by RT PCR NEGATIVE NEGATIVE Final    Comment: (NOTE) SARS-CoV-2 target nucleic acids are NOT  DETECTED. The SARS-CoV-2 RNA is generally detectable in upper respiratoy specimens during the acute phase of infection. The lowest concentration of SARS-CoV-2 viral copies this assay can detect is 131 copies/mL. A negative result does not preclude SARS-Cov-2 infection and should not be used as the sole basis for treatment or other patient management decisions. A negative result may occur with  improper specimen collection/handling, submission of specimen other than nasopharyngeal swab, presence of viral mutation(s) within the areas targeted by this assay, and inadequate number of viral copies (<131 copies/mL). A negative result must be combined with clinical observations, patient history, and epidemiological information. The expected result is Negative. Fact Sheet for Patients:  PinkCheek.be Fact Sheet for Healthcare Providers:  GravelBags.it This test is not yet ap proved or cleared by the [place] FDA and  has been authorized for detection and/or diagnosis of SARS-CoV-2 by FDA under an Emergency Use Authorization (EUA). This EUA will remain  in effect (meaning this test can be used) for the duration  of the COVID-19 declaration under Section 564(b)(1) of the Act, 21 U.S.C. section 360bbb-3(b)(1), unless the authorization is terminated or revoked sooner.    Influenza A by PCR NEGATIVE NEGATIVE Final   Influenza B by PCR NEGATIVE NEGATIVE Final    Comment: (NOTE) The Xpert Xpress SARS-CoV-2/FLU/RSV assay is intended as an aid in  the diagnosis of influenza from Nasopharyngeal swab specimens and  should not be used as a sole basis for treatment. Nasal washings and  aspirates are unacceptable for Xpert Xpress SARS-CoV-2/FLU/RSV  testing. Fact Sheet for Patients: PinkCheek.be Fact Sheet for Healthcare Providers: GravelBags.it This test is not yet approved or cleared  by the [place] FDA and  has been authorized for detection and/or diagnosis of SARS-CoV-2 by  FDA under an Emergency Use Authorization (EUA). This EUA will remain  in effect (meaning this test can be used) for the duration of the  Covid-19 declaration under Section 564(b)(1) of the Act, 21  U.S.C. section 360bbb-3(b)(1), unless the authorization is  terminated or revoked. Performed at [facility], [address] [place], [place] [address]       Radiology Studies: No results found.  Scheduled Meds: . apixaban  5 mg Oral BID  . atorvastatin  40 mg Oral q1800  . azithromycin  500 mg Oral Daily  . bisoprolol  5 mg Oral Daily  . diltiazem  360 mg Oral Daily  . furosemide  20 mg Oral Daily  . guaiFENesin  1,200 mg Oral BID  . insulin aspart  0-15 Units Subcutaneous TID WC  . insulin aspart  0-5 Units Subcutaneous QHS  . ipratropium-albuterol  3 mL Nebulization TID  . loratadine  10 mg Oral Daily  . methylPREDNISolone (SOLU-MEDROL) injection  60 mg Intravenous Q6H  . mometasone-formoterol  2 puff Inhalation BID  . pantoprazole  40 mg Oral BID   Continuous Infusions: . sodium chloride Stopped (03/04/20 1516)     LOS: 4 days   Time spent: 25 minutes.  Patrecia Pour, MD [facility] [email or web address] 03/08/2020, 6:34 PM

## 2020-03-09 DIAGNOSIS — Z87891 Personal history of nicotine dependence: Secondary | ICD-10-CM

## 2020-03-09 DIAGNOSIS — I480 Paroxysmal atrial fibrillation: Secondary | ICD-10-CM

## 2020-03-09 DIAGNOSIS — E782 Mixed hyperlipidemia: Secondary | ICD-10-CM

## 2020-03-09 DIAGNOSIS — E1169 Type 2 diabetes mellitus with other specified complication: Secondary | ICD-10-CM

## 2020-03-09 LAB — GLUCOSE, CAPILLARY
Glucose-Capillary: 166 mg/dL — ABNORMAL HIGH (ref 70–99)
Glucose-Capillary: 200 mg/dL — ABNORMAL HIGH (ref 70–99)

## 2020-03-09 MED ORDER — PREDNISONE 10 MG PO TABS: ORAL_TABLET | ORAL | 0 refills | Status: AC

## 2020-03-09 MED ORDER — METHYLPREDNISOLONE SODIUM SUCC 40 MG IJ SOLR: 40.0000 mg | Freq: Four times a day (QID) | INTRAMUSCULAR | Status: DC

## 2020-03-09 NOTE — Discharge Summary (Signed)
Physician Discharge Summary  Paul [name] [id number] DOB: 1946-10-10 DOA: 03/04/2020  PCP: [name], MD  Admit date: 03/04/2020 Discharge date: 03/09/2020  Admitted From: Home Disposition: Home   Recommendations for Outpatient Follow-up:  1. Follow up with pulmonology in next week, discharged on steroid taper as below.  Home Health: None new Equipment/Devices: None new Discharge Condition: Stable CODE STATUS: Full Diet recommendation: Heart healthy, carb-modified  Brief/Interim Summary: Paul [name] is a 74 year old with past medical history significant for COPD with recurrent admission to the hospital for the same last being from 2/24--2/28 he is on nocturnal 2 L of oxygen at home who presents with worsening cough and congestion. His oxygen sat was low, 84 on room air. He has received both doses of Covid vaccine. Patient received IV Solu-Medrol and nebulizer treatment in the ED and was admitted for COPD exacerbation. With continued bronchodilator therapy in addition to IV steroids, respiratory status ultimately improved, returning to baseline level of hypoxemia.   Discharge Diagnoses:  Active Problems:   COPD GOLD  III    Chronic respiratory failure with hypoxia (HCC)   Former cigarette smoker   Paroxysmal atrial fibrillation (HCC)   Chronic anticoagulation   COPD with acute exacerbation (HCC)   Chronic anemia   HLD (hyperlipidemia)   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HCC)   COPD exacerbation (HCC)  Acute on chronic hypoxic respiratory failure due to COPD exacerbation: - Discussed progressive nature of COPD with patient and wife at length. Will follow up with Dr. Melvyn Novas next week. May be progressing toward 24/7 oxygen requirement, but has returned to baseline level of hypoxemia and exertional capacity has improved. Wheezing resolved with tapering steroid dose. Has had multiple readmissions and remains at high risk for this. - Continue steroids: prednisone 60mg  x5  days > 50mg  x5 days > 40mg  until pulmonology follow up.  - Continue nebulized therapies  - Completed azithromycin x5 days.   PAF:  - Continue bisoprolol and eliquis  Chronic HFpEF: No pulmonary edema/exacerbation noted.  - Continue home medications  HLD: - Continue statin  T2DM:  - Continue home medications  Discharge Instructions Discharge Instructions    Diet - low sodium heart healthy   Complete by: As directed    Discharge instructions   Complete by: As directed    You are stable for discharge after treatment for COPD exacerbation. You will continue prednisone 60mg  daily for 5 days, then 50mg  daily for 5 days, then return to usual 40mg  dose. You will need to follow up with pulmonology and/or your PCP in the next week for continued management and dosing of steroids. You may use supplemental oxygen if necessary during the day at home. Seek medical attention right away if your symptoms recur.   Increase activity slowly   Complete by: As directed      Allergies as of 03/09/2020      Reactions   Alendronate Other (See Comments)   Jittery/nervous      Medication List    TAKE these medications   apixaban 5 MG Tabs tablet Commonly known as: ELIQUIS Take 1 tablet (5 mg total) by mouth 2 (two) times daily.   atorvastatin 40 MG tablet Commonly known as: LIPITOR TAKE 1 TABLET(40 MG) BY MOUTH DAILY AFTER SUPPER What changed:   how much to take  how to take this  when to take this  additional instructions   bisoprolol 5 MG tablet Commonly known as: ZEBETA Take 5 mg by mouth daily.  budesonide-formoterol 160-4.5 MCG/ACT inhaler Commonly known as: SYMBICORT Inhale 2 puffs into the lungs 2 (two) times daily.   Calcium 200 MG Tabs Take 600 mg by mouth daily.   cholecalciferol 25 MCG (1000 UNIT) tablet Commonly known as: VITAMIN D3 Take 1,000 Units by mouth daily.   denosumab 60 MG/ML Sosy injection Commonly known as: PROLIA Inject 60 mg into the skin every  6 (six) months.   diltiazem 360 MG 24 hr capsule Commonly known as: TIAZAC Take 1 capsule (360 mg total) by mouth daily.   furosemide 40 MG tablet Commonly known as: LASIX Take 1 tablet (40 mg total) by mouth daily. What changed: how much to take   guaiFENesin 600 MG 12 hr tablet Commonly known as: MUCINEX Take 600 mg by mouth 2 (two) times daily.   loratadine 10 MG tablet Commonly known as: CLARITIN Take 10 mg by mouth daily.   LORazepam 0.5 MG tablet Commonly known as: ATIVAN Take 0.5 mg by mouth every 8 (eight) hours as needed for anxiety.   Melatonin 5 MG Tabs Take 5 mg by mouth at bedtime.   omeprazole 20 MG capsule Commonly known as: PRILOSEC Take 20 mg by mouth in the morning and at bedtime.   predniSONE 10 MG tablet Commonly known as: DELTASONE Take 6 tablets (60 mg total) by mouth daily with breakfast for 5 days, THEN 5 tablets (50 mg total) daily with breakfast for 5 days, THEN 4 tablets (40 mg total) daily with breakfast for 5 days. Then continue 40mg  dose. Start taking on: March 09, 2020 What changed:   medication strength  See the new instructions.  Another medication with the same name was removed. Continue taking this medication, and follow the directions you see here.   ProAir HFA 108 (90 Base) MCG/ACT inhaler Generic drug: albuterol Inhale 2 puffs into the lungs every 6 (six) hours as needed for wheezing or shortness of breath.   albuterol (2.5 MG/3ML) 0.083% nebulizer solution Commonly known as: PROVENTIL Take 2.5 mg by nebulization in the morning and at bedtime.   Spiriva Respimat 2.5 MCG/ACT Aers Generic drug: Tiotropium Bromide Monohydrate Inhale 2 puffs into the lungs daily.   vitamin C 250 MG tablet Commonly known as: ASCORBIC ACID Take 500 mg by mouth daily.      Follow-up Information    Cox, Kirsten, MD. Schedule an appointment as soon as possible for a visit in 1 week(s).   Specialties: Family Medicine, Interventional Cardiology,  Radiology, Anesthesiology Contact information: [address] [place] [address] [phone]          Allergies  Allergen Reactions  . Alendronate Other (See Comments)    Jittery/nervous    Consultations:  None  Procedures/Studies: DG Chest 2 View  Result Date: 02/16/2020 CLINICAL DATA:  Shortness of breath with chest pain/tightness since this morning. History of COPD. EXAM: CHEST - 2 VIEW COMPARISON:  Radiographs 02/02/2020 and 01/06/2020. CT 02/03/2020. FINDINGS: The heart size and mediastinal contours are stable with mild aortic atherosclerosis. The lungs are hyperinflated but clear. There is no pleural effusion or pneumothorax. Multiple thoracic compression deformities are noted, not significantly changed from recent chest CT. IMPRESSION: Stable chest with evidence of chronic obstructive pulmonary disease. No acute cardiopulmonary process. Electronically Signed   By: Richardean Sale M.D.   On: 02/16/2020 18:47   DG Chest Port 1 View  Result Date: 03/04/2020 CLINICAL DATA:  Shortness of breath in a patient with a history of COPD. EXAM: PORTABLE CHEST  1 VIEW COMPARISON:  CT chest 02/03/2020.  PA and lateral chest 02/16/2020. FINDINGS: The lungs are emphysematous but clear. Heart size is normal. No pneumothorax or pleural effusion. Atherosclerosis is noted. IMPRESSION: No acute disease. Emphysema. Atherosclerosis. Electronically Signed   By: Inge Rise M.D.   On: 03/04/2020 12:12     Subjective: Feels much better, breathing near baseline, walking several times per day with resolution of wheezing. Getting around well. Ready to go home.  Discharge Exam: Vitals:   03/09/20 0536 03/09/20 0736  BP: 137/76   Pulse: 74   Resp: 16   Temp: 97.9 F (36.6 C)   SpO2: 96% 95%   General: Pt is alert, awake, not in acute distress Cardiovascular: RRR, S1/S2 +, no rubs, no gallops Respiratory: CTA bilaterally, no wheezing, no rhonchi Abdominal: Soft, NT, ND, bowel  sounds + Extremities: No edema, no cyanosis  Labs: BNP (last 3 results) Recent Labs    02/02/20 1200 02/16/20 1726 03/04/20 1220  BNP 87.4 88.9 [id number]   Basic Metabolic Panel: Recent Labs  Lab 03/04/20 1220 03/05/20 0542 03/08/20 0502  NA 139 141 140  K 3.4* 3.5 3.9  CL 94* 98 101  CO2 31 30 30   GLUCOSE 181* 156* 175*  BUN 14 16 18   CREATININE 0.81 0.51* 0.61  CALCIUM 8.4* 7.9* 8.0*   Liver Function Tests: No results for input(s): AST, ALT, ALKPHOS, BILITOT, PROT, ALBUMIN in the last 168 hours. No results for input(s): LIPASE, AMYLASE in the last 168 hours. No results for input(s): AMMONIA in the last 168 hours. CBC: Recent Labs  Lab 03/04/20 1220 03/05/20 0542 03/08/20 0502  WBC 16.2* 8.8 10.3  NEUTROABS 15.3*  --   --   HGB 12.2* 10.5* 10.6*  HCT 38.7* 33.8* 33.8*  MCV 82.7 81.6 81.6  PLT 315 262 242   Cardiac Enzymes: No results for input(s): CKTOTAL, CKMB, CKMBINDEX, TROPONINI in the last 168 hours. BNP: Invalid input(s): POCBNP CBG: Recent Labs  Lab 03/08/20 1203 03/08/20 1710 03/08/20 2115 03/09/20 0740 03/09/20 1139  GLUCAP 133* 268* 112* 166* 200*   D-Dimer No results for input(s): DDIMER in the last 72 hours. Hgb A1c No results for input(s): HGBA1C in the last 72 hours. Lipid Profile No results for input(s): CHOL, HDL, LDLCALC, TRIG, CHOLHDL, LDLDIRECT in the last 72 hours. Thyroid function studies No results for input(s): TSH, T4TOTAL, T3FREE, THYROIDAB in the last 72 hours.  Invalid input(s): FREET3 Anemia work up No results for input(s): VITAMINB12, FOLATE, FERRITIN, TIBC, IRON, RETICCTPCT in the last 72 hours. Urinalysis    Component Value Date/Time   COLORURINE YELLOW 03/03/2019 1325   APPEARANCEUR CLEAR 03/03/2019 1325   LABSPEC <1.005 (L) 03/03/2019 1325   PHURINE 6.5 03/03/2019 1325   GLUCOSEU NEGATIVE 03/03/2019 1325   HGBUR NEGATIVE 03/03/2019 1325   BILIRUBINUR NEGATIVE 03/03/2019 1325   KETONESUR NEGATIVE 03/03/2019  1325   PROTEINUR NEGATIVE 03/03/2019 1325   NITRITE NEGATIVE 03/03/2019 1325   LEUKOCYTESUR NEGATIVE 03/03/2019 1325    Microbiology Recent Results (from the past 240 hour(s))  Respiratory Panel by RT PCR (Flu A&B, Covid) - Nasopharyngeal Swab     Status: None   Collection Time: 03/04/20 12:25 PM   Specimen: Nasopharyngeal Swab  Result Value Ref Range Status   SARS Coronavirus 2 by RT PCR NEGATIVE NEGATIVE Final    Comment: (NOTE) SARS-CoV-2 target nucleic acids are NOT DETECTED. The SARS-CoV-2 RNA is generally detectable in upper respiratoy specimens during the acute phase of infection. The lowest  concentration of SARS-CoV-2 viral copies this assay can detect is 131 copies/mL. A negative result does not preclude SARS-Cov-2 infection and should not be used as the sole basis for treatment or other patient management decisions. A negative result may occur with  improper specimen collection/handling, submission of specimen other than nasopharyngeal swab, presence of viral mutation(s) within the areas targeted by this assay, and inadequate number of viral copies (<131 copies/mL). A negative result must be combined with clinical observations, patient history, and epidemiological information. The expected result is Negative. Fact Sheet for Patients:  PinkCheek.be Fact Sheet for Healthcare Providers:  GravelBags.it This test is not yet ap proved or cleared by the [place] FDA and  has been authorized for detection and/or diagnosis of SARS-CoV-2 by FDA under an Emergency Use Authorization (EUA). This EUA will remain  in effect (meaning this test can be used) for the duration of the COVID-19 declaration under Section 564(b)(1) of the Act, 21 U.S.C. section 360bbb-3(b)(1), unless the authorization is terminated or revoked sooner.    Influenza A by PCR NEGATIVE NEGATIVE Final   Influenza B by PCR NEGATIVE NEGATIVE Final     Comment: (NOTE) The Xpert Xpress SARS-CoV-2/FLU/RSV assay is intended as an aid in  the diagnosis of influenza from Nasopharyngeal swab specimens and  should not be used as a sole basis for treatment. Nasal washings and  aspirates are unacceptable for Xpert Xpress SARS-CoV-2/FLU/RSV  testing. Fact Sheet for Patients: PinkCheek.be Fact Sheet for Healthcare Providers: GravelBags.it This test is not yet approved or cleared by the [place] FDA and  has been authorized for detection and/or diagnosis of SARS-CoV-2 by  FDA under an Emergency Use Authorization (EUA). This EUA will remain  in effect (meaning this test can be used) for the duration of the  Covid-19 declaration under Section 564(b)(1) of the Act, 21  U.S.C. section 360bbb-3(b)(1), unless the authorization is  terminated or revoked. Performed at [facility], [address] [place], [place] [address]     Time coordinating discharge: Approximately 40 minutes  Patrecia Pour, MD  [facility] 03/09/2020, 12:14 PM

## 2020-03-09 NOTE — Progress Notes (Signed)
Patient discharged to home with family, discharge instructions reviewed with patient who verbalized understanding. 

## 2020-03-16 ENCOUNTER — Encounter: Payer: Self-pay | Admitting: Adult Health

## 2020-03-16 ENCOUNTER — Other Ambulatory Visit: Payer: Self-pay

## 2020-03-16 ENCOUNTER — Inpatient Hospital Stay: Payer: Medicare Other | Admitting: Adult Health

## 2020-03-16 ENCOUNTER — Ambulatory Visit (INDEPENDENT_AMBULATORY_CARE_PROVIDER_SITE_OTHER): Payer: Medicare Other | Admitting: Adult Health

## 2020-03-16 VITALS — BP 106/54 | HR 89 | Temp 97.1°F | Ht 66.0 in | Wt 137.0 lb

## 2020-03-16 DIAGNOSIS — J449 Chronic obstructive pulmonary disease, unspecified: Secondary | ICD-10-CM

## 2020-03-16 DIAGNOSIS — J9611 Chronic respiratory failure with hypoxia: Secondary | ICD-10-CM

## 2020-03-16 NOTE — Patient Instructions (Signed)
Continue on Symbicort 2 puffs Twice daily   Continue on Spiriva daily  Albuterol Neb As needed   Wear Oxygen 2l/m all the time .  Continue on Daliresp daily.  Low salt diet  Refer to Palliative Care  Taper prednisone to 20mg  daily as directed , hold at this dose until seen back in office in 3 weeks  Please contact office for sooner follow up if symptoms do not improve or worsen or seek emergency care  Follow up with Dr. Melvyn Novas  In 3 weeks and As needed

## 2020-03-16 NOTE — Assessment & Plan Note (Signed)
Patient is continue on oxygen with activity and at bedtime.  Of advised him to wear his oxygen most of the time.  If he is sitting completely still and O2 saturations are greater than 90% may take oxygen off otherwise would like for him to keep oxygen .  O2 saturations goals greater than 90%

## 2020-03-16 NOTE — Assessment & Plan Note (Signed)
Severe COPD that is prone to frequent exacerbations and hospitalizations requiring frequent steroid burst. With patient and his wife regarding dangers of high prednisone usage.  Have advised patient to taper prednisone as directed down to 20 mg. If continues to have to stay at higher dose prednisone will need to add in prophylactic Bactrim 3 days a week. Patient was recently started on Daliresp he is advised to continue on this. He will remain on triple therapy with Spiriva and Symbicort.  May use albuterol as needed. Patient has significant symptom burden is oxygen dependent and has severe COPD Long discussion regarding quality of life and symptom burden.  Referral for palliative care consult.  Plan  Patient Instructions  Continue on Symbicort 2 puffs Twice daily   Continue on Spiriva daily  Albuterol Neb As needed   Wear Oxygen 2l/m all the time .  Continue on Daliresp daily.  Low salt diet  Refer to Palliative Care  Taper prednisone to 20mg  daily as directed , hold at this dose until seen back in office in 3 weeks  Please contact office for sooner follow up if symptoms do not improve or worsen or seek emergency care  Follow up with Dr. Melvyn Novas  In 3 weeks and As needed

## 2020-03-16 NOTE — Progress Notes (Signed)
@Patient  ID: Paul [name], male    DOB: 02/21/1946, 74 y.o.   MRN: [id number]  Chief Complaint  Patient presents with  . Hospitalization Follow-up    COPD     Referring provider: [name], MD  HPI: 74 year old male former smoker quit in 2010 followed for severe gold 3 COPD and chronic respiratory failure on  oxygen,   Medical history significant for A. 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  Patient has had multiple hospitalizations recently for COPD exacerbations.  He was last admitted 1 week ago for COPD exacerbation.  He was treated with nebulized bronchodilators and steroid.  He was discharged on a steroid taper.  Also azithromycin. Since discharge patient is feeling better with less cough and congestion . Gets winded with minimal activity .  He is currently on Symbicort twice daily Spiriva daily and has albuterol nebulizer uses 1-2 times a day .  Started on Stella recently , no n/v/d.  Gets frequent doses of High  Steroid doses , baseline now 20mg  daily .  Retaining more fluid in ankles.  Patient has known A. fib is on bisoprolol and Eliquis.   He has received both of his Covid vaccines.   Allergies  Allergen Reactions  . Alendronate Other (See Comments)    Jittery/nervous    Immunization History  Administered Date(s) Administered  . Influenza Split 09/22/2013, 09/01/2015  .  Influenza Whole 09/04/2011, 09/21/2012  . Influenza, High Dose Seasonal PF 09/02/2016, 09/10/2017, 08/31/2018, 09/24/2018, 10/21/2019  . Influenza-Unspecified 08/23/2014  . Moderna SARS-COVID-2 Vaccination 01/17/2020, 02/14/2020  . Pneumococcal Conjugate-13 01/11/2014, 12/09/2016  . Pneumococcal Polysaccharide-23 08/23/2010, 08/29/2011  . Zoster Recombinat (Shingrix) 04/04/2014    Past Medical History:  Diagnosis Date  . A-fib ([place])   . Anemia   . Atrial fibrillation ([place])   . Back pain   . COPD (chronic obstructive pulmonary disease) ([place])   . COPD (chronic obstructive pulmonary disease) ([place])   . Elevated PSA   . GAD (generalized anxiety disorder)   . High cholesterol   . Hypertension   . Impaired fasting glucose   . Osteoporosis     Tobacco History: Social History   Tobacco Use  Smoking Status Former Smoker  . Packs/day: 2.00  . Years: 52.00  . Pack years: 104.00  . Types: Cigarettes  . Quit date: 02/03/2009  . Years since quitting: 11.1  Smokeless Tobacco Never Used  Tobacco Comment   Counseled to remain smoke free   Counseling given: Not Answered Comment: Counseled to remain smoke free   Outpatient Medications Prior to Visit  Medication Sig Dispense Refill  . albuterol (PROAIR HFA) 108 (90 Base) MCG/ACT inhaler Inhale 2 puffs into the lungs every 6 (six) hours as needed for wheezing or shortness of breath.     Marland Kitchen albuterol (PROVENTIL) (2.5 MG/3ML) 0.083% nebulizer solution Take 2.5 mg by  nebulization in the morning and at bedtime.    Marland Kitchen apixaban (ELIQUIS) 5 MG TABS tablet Take 1 tablet (5 mg total) by mouth 2 (two) times daily. 180 tablet 3  . atorvastatin (LIPITOR) 40 MG tablet TAKE 1 TABLET(40 MG) BY MOUTH DAILY AFTER SUPPER (Patient taking differently: Take 40 mg by mouth daily at 6 PM. ) 90 tablet 3  . bisoprolol (ZEBETA) 5 MG tablet Take 5 mg by mouth daily.    . budesonide-formoterol (SYMBICORT) 160-4.5 MCG/ACT inhaler Inhale 2 puffs into the lungs 2 (two)  times daily.    . Calcium 200 MG TABS Take 600 mg by mouth daily.    . cholecalciferol (VITAMIN D3) 25 MCG (1000 UNIT) tablet Take 1,000 Units by mouth daily.    Marland Kitchen denosumab (PROLIA) 60 MG/ML SOSY injection Inject 60 mg into the skin every 6 (six) months.    . diltiazem (TIAZAC) 360 MG 24 hr capsule Take 1 capsule (360 mg total) by mouth daily. 90 capsule 3  . furosemide (LASIX) 40 MG tablet Take 1 tablet (40 mg total) by mouth daily. (Patient taking differently: Take 20 mg by mouth daily. ) 90 tablet 3  . guaiFENesin (MUCINEX) 600 MG 12 hr tablet Take 600 mg by mouth 2 (two) times daily.    Marland Kitchen loratadine (CLARITIN) 10 MG tablet Take 10 mg by mouth daily.    Marland Kitchen LORazepam (ATIVAN) 0.5 MG tablet Take 0.5 mg by mouth every 8 (eight) hours as needed for anxiety.    . Melatonin 5 MG TABS Take 5 mg by mouth at bedtime.    Marland Kitchen omeprazole (PRILOSEC) 20 MG capsule Take 20 mg by mouth in the morning and at bedtime.    . predniSONE (DELTASONE) 10 MG tablet Take 6 tablets (60 mg total) by mouth daily with breakfast for 5 days, THEN 5 tablets (50 mg total) daily with breakfast for 5 days, THEN 4 tablets (40 mg total) daily with breakfast for 5 days. Then continue 40mg  dose. (Patient taking differently: Take 5 mg per day) 75 tablet 0  . Tiotropium Bromide Monohydrate (SPIRIVA RESPIMAT) 2.5 MCG/ACT AERS Inhale 2 puffs into the lungs daily. 8 g 0  . vitamin C (ASCORBIC ACID) 250 MG tablet Take 500 mg by mouth daily.     No facility-administered medications prior to visit.     Review of Systems:   Constitutional:   No  weight loss, night sweats,  Fevers, chills,  +fatigue, or  lassitude.  HEENT:   No headaches,  Difficulty swallowing,  Tooth/dental problems, or  Sore throat,                No sneezing, itching, ear ache, nasal congestion, post nasal drip,   CV:  No chest pain,  Orthopnea, PND, swelling in lower extremities, anasarca, dizziness, palpitations, syncope.   GI  No heartburn, indigestion,  abdominal pain, nausea, vomiting, diarrhea, change in bowel habits, loss of appetite, bloody stools.   Resp:    No chest wall deformity  Skin: no rash or lesions.  GU: no dysuria, change in color of urine, no urgency or frequency.  No flank pain, no hematuria   MS:  No joint pain or swelling.  No decreased range of motion.  No back pain.    Physical Exam  BP (!) 106/54 (BP Location: Left Arm, Cuff Size: Normal)   Pulse 89   Temp (!) 97.1 F (36.2 C) (Temporal)   Ht 5\' 6"  (1.676 m)   Wt 137 lb (  62.1 kg)   SpO2 93% Comment: RA  BMI 22.11 kg/m   GEN: A/Ox3; pleasant , NAD, elderly    HEENT:  [place]/AT,, NOSE-clear, THROAT-clear, no lesions, no postnasal drip or exudate noted.   NECK:  Supple w/ fair ROM; no JVD; normal carotid impulses w/o bruits; no thyromegaly or nodules palpated; no lymphadenopathy.    RESP  Decreased BS in bases  no accessory muscle use, no dullness to percussion  CARD:  RRR, no m/r/g, no peripheral edema, pulses intact, no cyanosis or clubbing.  GI:   Soft & nt; nml bowel sounds; no organomegaly or masses detected.   Musco: Warm bil, no deformities or joint swelling noted.   Neuro: alert, no focal deficits noted.    Skin: Warm, no lesions or rashes    Lab Results:   BMET   ProBNP No results found for: PROBNP  Imaging: DG Chest 2 View  Result Date: 02/16/2020 CLINICAL DATA:  Shortness of breath with chest pain/tightness since this morning. History of COPD. EXAM: CHEST - 2 VIEW COMPARISON:  Radiographs 02/02/2020 and 01/06/2020. CT 02/03/2020. FINDINGS: The heart size and mediastinal contours are stable with mild aortic atherosclerosis. The lungs are hyperinflated but clear. There is no pleural effusion or pneumothorax. Multiple thoracic compression deformities are noted, not significantly changed from recent chest CT. IMPRESSION: Stable chest with evidence of chronic obstructive pulmonary disease. No acute cardiopulmonary process. Electronically  Signed   By: Richardean Sale M.D.   On: 02/16/2020 18:47   DG Chest Port 1 View  Result Date: 03/04/2020 CLINICAL DATA:  Shortness of breath in a patient with a history of COPD. EXAM: PORTABLE CHEST 1 VIEW COMPARISON:  CT chest 02/03/2020.  PA and lateral chest 02/16/2020. FINDINGS: The lungs are emphysematous but clear. Heart size is normal. No pneumothorax or pleural effusion. Atherosclerosis is noted. IMPRESSION: No acute disease. Emphysema. Atherosclerosis. Electronically Signed   By: Inge Rise M.D.   On: 03/04/2020 12:12    denosumab (PROLIA) injection 60 mg    Date Action Dose Route User   Discharged on 03/09/2020   Admitted on 03/04/2020   03/02/2020 1447 Given 60 mg Subcutaneous (Left Arm) Effie Shy, RN      PFT Results Latest Ref Rng & Units 05/29/2016  FVC-Pre L 1.92  FVC-Predicted Pre % 52  Pre FEV1/FVC % % 41  FEV1-Pre L 0.78  FEV1-Predicted Pre % 29    No results found for: NITRICOXIDE      Assessment & Plan:   COPD GOLD  III  Severe COPD that is prone to frequent exacerbations and hospitalizations requiring frequent steroid burst. With patient and his wife regarding dangers of high prednisone usage.  Have advised patient to taper prednisone as directed down to 20 mg. If continues to have to stay at higher dose prednisone will need to add in prophylactic Bactrim 3 days a week. Patient was recently started on Daliresp he is advised to continue on this. He will remain on triple therapy with Spiriva and Symbicort.  May use albuterol as needed. Patient has significant symptom burden is oxygen dependent and has severe COPD Long discussion regarding quality of life and symptom burden.  Referral for palliative care consult.  Plan  Patient Instructions  Continue on Symbicort 2 puffs Twice daily   Continue on Spiriva daily  Albuterol Neb As needed   Wear Oxygen 2l/m all the time .  Continue on Daliresp daily.  Low salt diet  Refer to Palliative Care  Taper prednisone to 20mg  daily as directed , hold at this dose until seen back in office in 3 weeks  Please contact office for sooner follow up if symptoms do not improve or worsen or seek emergency care  Follow up with Dr. Melvyn Novas  In 3 weeks and As needed      Chronic respiratory failure with hypoxia [facility]) Patient is continue on oxygen with activity and at bedtime.  Of advised him to wear his oxygen most of the time.  If he is sitting completely still and O2 saturations are greater than 90% may take oxygen off otherwise would like for him to keep oxygen .  O2 saturations goals greater than 90%     Rexene Edison, NP 03/16/2020

## 2020-03-21 ENCOUNTER — Telehealth: Payer: Self-pay | Admitting: Internal Medicine

## 2020-03-21 NOTE — Telephone Encounter (Signed)
Spoke with Paul [name], nurse with hospice care  She is states that the pt has been coughing up yellow sputum for the past 24 hours  He is afebrile and lungs sounds clear but diminished  She asked about albuterol usage- I advised he has proair and albuterol nebs prn  Dr Melvyn Novas rec that he use proair as plan B and then neb plan C   She states she thinks that he should be using the neb only while at home- is this okay? Also, he is having increased anxiety due to being SOB and he uses lorazepam 0.5 qd, she wonders if he can use more often. MAR states Q8H prn but her orders say 1 qd. Please advise and route to lbpu triage thanks

## 2020-03-21 NOTE — Telephone Encounter (Signed)
He can take 0.5 ativan three times a day for anxiety. He can use Albuterol nebulizer when home for shortness of breath

## 2020-03-21 NOTE — Telephone Encounter (Signed)
LMTCB for [employer]

## 2020-03-22 ENCOUNTER — Telehealth: Payer: Self-pay

## 2020-03-22 NOTE — Telephone Encounter (Signed)
Agree. Kc  

## 2020-03-22 NOTE — Telephone Encounter (Signed)
Spoke with [employer]. She is aware of Beth's recommendations. Nothing further needed.

## 2020-03-22 NOTE — Telephone Encounter (Signed)
Paul [name] w/[facility] returning call.  [phone].

## 2020-03-22 NOTE — Telephone Encounter (Signed)
If lungs are clear and no fever please monitor. Recommend delsym OTC and mucinex 600mg  twice daily. If continues to have colored mucus >5-7 days will consider abx

## 2020-03-22 NOTE — Telephone Encounter (Signed)
LMTCB x2 for Joann.

## 2020-03-22 NOTE — Telephone Encounter (Signed)
Spoke with Paul [name] and notified of recs per Eustaquio Maize She is asking about what to do about his cough with yellow sputum  The cough is keeping him up at night  This part of the msg not addressed 3/30- please advise thanks

## 2020-03-22 NOTE — Telephone Encounter (Addendum)
Joann from [facility] called to report they are now working with [name] per request of Dr. Melvyn Novas.  She wanted to review his ativan order.  He has been taking the medication daily but she feels that it needs to be more frequently.  I see that the chart says that the medication can be taken every 8 hours.  She is going to encourage him to take the medication as prescribed if needed more that once daily.

## 2020-03-26 ENCOUNTER — Emergency Department (HOSPITAL_COMMUNITY)
Admission: EM | Admit: 2020-03-26 | Discharge: 2020-03-27 | Disposition: A | Discharge status: Home / Self Care | Payer: Medicare Other | Attending: Emergency Medicine | Admitting: Emergency Medicine

## 2020-03-26 ENCOUNTER — Encounter (HOSPITAL_COMMUNITY): Payer: Self-pay

## 2020-03-26 DIAGNOSIS — R4020 Unspecified coma: Secondary | ICD-10-CM | POA: Diagnosis not present

## 2020-03-26 DIAGNOSIS — I5030 Unspecified diastolic (congestive) heart failure: Secondary | ICD-10-CM | POA: Diagnosis not present

## 2020-03-26 DIAGNOSIS — R062 Wheezing: Secondary | ICD-10-CM | POA: Diagnosis not present

## 2020-03-26 DIAGNOSIS — I110 Hypertensive heart disease with heart failure: Secondary | ICD-10-CM | POA: Insufficient documentation

## 2020-03-26 DIAGNOSIS — J449 Chronic obstructive pulmonary disease, unspecified: Secondary | ICD-10-CM | POA: Insufficient documentation

## 2020-03-26 DIAGNOSIS — I480 Paroxysmal atrial fibrillation: Secondary | ICD-10-CM | POA: Insufficient documentation

## 2020-03-26 DIAGNOSIS — R0789 Other chest pain: Secondary | ICD-10-CM | POA: Insufficient documentation

## 2020-03-26 DIAGNOSIS — R05 Cough: Secondary | ICD-10-CM | POA: Diagnosis not present

## 2020-03-26 DIAGNOSIS — J439 Emphysema, unspecified: Secondary | ICD-10-CM

## 2020-03-26 DIAGNOSIS — Z7984 Long term (current) use of oral hypoglycemic drugs: Secondary | ICD-10-CM | POA: Diagnosis not present

## 2020-03-26 DIAGNOSIS — Z7901 Long term (current) use of anticoagulants: Secondary | ICD-10-CM | POA: Insufficient documentation

## 2020-03-26 DIAGNOSIS — I959 Hypotension, unspecified: Secondary | ICD-10-CM | POA: Diagnosis not present

## 2020-03-26 DIAGNOSIS — E119 Type 2 diabetes mellitus without complications: Secondary | ICD-10-CM | POA: Diagnosis not present

## 2020-03-26 DIAGNOSIS — R55 Syncope and collapse: Secondary | ICD-10-CM | POA: Diagnosis not present

## 2020-03-26 DIAGNOSIS — E876 Hypokalemia: Secondary | ICD-10-CM | POA: Insufficient documentation

## 2020-03-26 DIAGNOSIS — I951 Orthostatic hypotension: Secondary | ICD-10-CM | POA: Diagnosis not present

## 2020-03-26 DIAGNOSIS — R0602 Shortness of breath: Secondary | ICD-10-CM | POA: Insufficient documentation

## 2020-03-26 LAB — CBC
HCT: 34.6 % — ABNORMAL LOW (ref 39.0–52.0)
Hemoglobin: 11 g/dL — ABNORMAL LOW (ref 13.0–17.0)
MCH: 25.9 pg — ABNORMAL LOW (ref 26.0–34.0)
MCHC: 31.8 g/dL (ref 30.0–36.0)
MCV: 81.6 fL (ref 80.0–100.0)
Platelets: 258 10*3/uL (ref 150–400)
RBC: 4.24 MIL/uL (ref 4.22–5.81)
RDW: 16.8 % — ABNORMAL HIGH (ref 11.5–15.5)
WBC: 9.4 10*3/uL (ref 4.0–10.5)
nRBC: 0 % (ref 0.0–0.2)

## 2020-03-26 LAB — CBG MONITORING, ED: Glucose-Capillary: 166 mg/dL — ABNORMAL HIGH (ref 70–99)

## 2020-03-26 MED ORDER — IPRATROPIUM BROMIDE HFA 17 MCG/ACT IN AERS
2.0000 | INHALATION_SPRAY | Freq: Once | RESPIRATORY_TRACT | Status: AC
  Administered 2020-03-27: 2 via RESPIRATORY_TRACT
  Filled 2020-03-26: qty 12.9

## 2020-03-26 MED ORDER — AEROCHAMBER PLUS FLO-VU LARGE MISC
1.0000 | Freq: Once | Status: AC
  Administered 2020-03-27: 1

## 2020-03-26 MED ORDER — ALBUTEROL SULFATE HFA 108 (90 BASE) MCG/ACT IN AERS
6.0000 | INHALATION_SPRAY | Freq: Once | RESPIRATORY_TRACT | Status: AC
  Administered 2020-03-27: 6 via RESPIRATORY_TRACT
  Filled 2020-03-26: qty 6.7

## 2020-03-26 NOTE — ED Triage Notes (Signed)
Pt was standing in kitchen; Pt eased himself to counter (has small abrasion on right elbow)  No complete LOC just felt dizzy.  Felt SOB today (Hx of COPD). Wears 2L [place] Chronically.  Given Albuterol by EMS and felt better with increase in O2 Saturation.   VSS with EMS.   CBG 193

## 2020-03-26 NOTE — ED Provider Notes (Signed)
[facility] EMERGENCY DEPARTMENT Provider Note  CSN: [id number] Arrival date & time: 03/26/20 2314  Chief Complaint(s) Loss of Consciousness  HPI Paul [name] is a 74 y.o. male with a past medical history listed below including A. fib on Eliquis, COPD with frequent exacerbations, diastolic heart failure on Lasix, and anxiety who presents to the emergency department with syncopal episode at home.  Patient reports that this occurred shortly after going from a sitting position to standing.  He was helping his wife put on her sling when the wife noted that he had a blank stare on his face and was not responding.  He then tried to lower himself to the ground.  She reports that about halfway down he dropped onto his left side, scraping his elbow.  He ended up face down on the floor.  Patient was out for several minutes before coming to.  There was no seizure-like activity or incontinence.  Wife reports that he had a similar episode last week where he stared off into space after standing from a sitting position.  He did not pass out at that time.  Patient did report that he has had increased peripheral edema over the past week prompting him to double his Lasix dose.  It is limiting his fluid intake as well.  Additionally patient has been having productive cough which he has been discussing with his pulmonologist and primary care provider.  Patient denied any associated symptoms related to the syncopal episode.  Denied any headache, chest pain, additional shortness of breath.  Currently denies any extremity pain or injuries other than skin abrasion on his left elbow.  He does report feeling chest tightness and mildy short of breath, which improved with breathing treatment by EMS.  HPI   Past Medical History Past Medical History:  Diagnosis Date  . A-fib ([place])   . Anemia   . Atrial fibrillation ([place])   . Back pain   . COPD (chronic obstructive pulmonary disease) ([place])   . COPD  (chronic obstructive pulmonary disease) ([place])   . Elevated PSA   . GAD (generalized anxiety disorder)   . High cholesterol   . Hypertension   . Impaired fasting glucose   . Osteoporosis    Patient Active Problem List   Diagnosis Date Noted  . COPD exacerbation ([place]) 03/04/2020  . Acute respiratory failure with hypoxia ([place]) 02/17/2020  . Osteoporosis with current pathological fracture 02/06/2020  . Chronic cough 01/31/2020  . Essential hypertension 01/28/2020  . Encounter for general adult medical examination with abnormal findings 01/25/2020  . Chronic bilateral thoracic back pain 01/25/2020  . 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 HLD (hyperlipidemia) 01/05/2020  . Vertebral compression fracture ([place]) 05/29/2019  . Chronic anemia 05/29/2019  . Pulmonary nodules 05/29/2019  . COPD with acute exacerbation ([place]) 05/28/2019  . Chronic anticoagulation 04/06/2019  . Coronary artery calcification 04/06/2019  . Atherosclerosis of aorta ([place]) 04/06/2019  . Paroxysmal atrial fibrillation ([facility])   . Acute on chronic respiratory failure with hypoxia ([place])   . Lung nodule < 6cm on CT 05/29/2016  . Former cigarette smoker 12/05/2015  . Chronic respiratory failure with hypoxia ([place]) 01/27/2013  . COPD GOLD  III     Home Medication(s) Prior to Admission medications   Medication Sig Start Date End Date Taking? Authorizing Provider  albuterol (PROAIR HFA) 108 (90 Base) MCG/ACT inhaler Inhale 2 puffs into the lungs every 6 (six) hours as needed for wheezing or shortness  of breath.     [provider]  albuterol (PROVENTIL) (2.5 MG/3ML) 0.083% nebulizer solution Take 2.5 mg by nebulization in the morning and at bedtime.    [provider]  apixaban (ELIQUIS) 5 MG TABS tablet Take 1 tablet (5 mg total) by mouth 2 (two) times daily. 12/27/19   Jerline Pain, MD  atorvastatin (LIPITOR) 40 MG tablet TAKE 1 TABLET(40 MG) BY MOUTH DAILY AFTER SUPPER Patient  taking differently: Take 40 mg by mouth daily at 6 PM.  12/27/19   Jerline Pain, MD  bisoprolol (ZEBETA) 5 MG tablet Take 5 mg by mouth daily.    [provider]  budesonide-formoterol (SYMBICORT) 160-4.5 MCG/ACT inhaler Inhale 2 puffs into the lungs 2 (two) times daily.    [provider]  Calcium 200 MG TABS Take 600 mg by mouth daily.    [provider]  cholecalciferol (VITAMIN D3) 25 MCG (1000 UNIT) tablet Take 1,000 Units by mouth daily.    [provider]  denosumab (PROLIA) 60 MG/ML SOSY injection Inject 60 mg into the skin every 6 (six) months.    [provider]  diltiazem (TIAZAC) 360 MG 24 hr capsule Take 1 capsule (360 mg total) by mouth daily. 02/24/20   Jerline Pain, MD  furosemide (LASIX) 40 MG tablet Take 1 tablet (40 mg total) by mouth daily. Patient taking differently: Take 20 mg by mouth daily.  12/27/19 03/26/20  Jerline Pain, MD  guaiFENesin (MUCINEX) 600 MG 12 hr tablet Take 600 mg by mouth 2 (two) times daily.    [provider]  loratadine (CLARITIN) 10 MG tablet Take 10 mg by mouth daily.    [provider]  LORazepam (ATIVAN) 0.5 MG tablet Take 0.5 mg by mouth every 8 (eight) hours as needed for anxiety.    [provider]  Melatonin 5 MG TABS Take 5 mg by mouth at bedtime.    [provider]  omeprazole (PRILOSEC) 20 MG capsule Take 20 mg by mouth in the morning and at bedtime.    [provider]  Roflumilast (DALIRESP) 250 MCG TABS Take 250 mg by mouth at bedtime.    [provider]  Tiotropium Bromide Monohydrate (SPIRIVA RESPIMAT) 2.5 MCG/ACT AERS Inhale 2 puffs into the lungs daily. 02/24/20   Tanda Rockers, MD  vitamin C (ASCORBIC ACID) 250 MG tablet Take 500 mg by mouth daily.    [provider]                                                                                                                                    Past Surgical History Past Surgical  History:  Procedure Laterality Date  . CATARACT EXTRACTION Right   . SKIN SURGERY     shoulders and chest   Family History Family History  Problem Relation Age of Onset  . Heart disease Brother   .  Heart disease Mother   . Heart disease Father   . Cancer Paternal Uncle     Social History Social History   Tobacco Use  . Smoking status: Former Smoker    Packs/day: 2.00    Years: 52.00    Pack years: 104.00    Types: Cigarettes    Quit date: 02/03/2009    Years since quitting: 11.1  . Smokeless tobacco: Never Used  . Tobacco comment: Counseled to remain smoke free  Substance Use Topics  . Alcohol use: Yes    Alcohol/week: 0.0 standard drinks    Comment: occ  . Drug use: No   Allergies Alendronate  Review of Systems Review of Systems All other systems are reviewed and are negative for acute change except as noted in the HPI  Physical Exam Vital Signs  I have reviewed the triage vital signs BP 126/60 (BP Location: Left Arm)   Pulse 81   Temp 99.6 F (37.6 C) (Oral)   Resp 20   Ht 5\' 6"  (1.676 m)   Wt 60.8 kg   SpO2 100%   BMI 21.63 kg/m   Physical Exam Vitals reviewed.  Constitutional:      General: He is not in acute distress.    Appearance: He is well-developed. He is not diaphoretic.  HENT:     Head: Normocephalic and atraumatic.     Nose: Nose normal.  Eyes:     General: No scleral icterus.       Right eye: No discharge.        Left eye: No discharge.     Conjunctiva/sclera: Conjunctivae normal.     Pupils: Pupils are equal, round, and reactive to light.  Cardiovascular:     Rate and Rhythm: Normal rate and regular rhythm.     Heart sounds: No murmur. No friction rub. No gallop.   Pulmonary:     Effort: Pulmonary effort is normal. Tachypnea present. No respiratory distress.     Breath sounds: Normal breath sounds. No stridor. No decreased breath sounds, wheezing, rhonchi or rales.  Abdominal:     General: There is no distension.      Palpations: Abdomen is soft.     Tenderness: There is no abdominal tenderness.  Musculoskeletal:        General: No tenderness.     Left elbow: No swelling. Normal range of motion. No tenderness.       Arms:     Cervical back: Normal range of motion and neck supple.     Right lower leg: No edema.     Left lower leg: No edema.  Skin:    General: Skin is warm and dry.     Findings: No erythema or rash.  Neurological:     Mental Status: He is alert and oriented to person, place, and time.     ED Results and Treatments Labs (all labs ordered are listed, but only abnormal results are displayed) Labs Reviewed  BASIC METABOLIC PANEL - Abnormal; Notable for the following components:      Result Value   Potassium 3.2 (*)    Chloride 92 (*)    CO2 33 (*)    Glucose, Bld 163 (*)    Calcium 8.3 (*)    All other components within normal limits  CBC - Abnormal; Notable for the following components:   Hemoglobin 11.0 (*)    HCT 34.6 (*)    MCH 25.9 (*)    RDW 16.8 (*)  All other components within normal limits  CBG MONITORING, ED - Abnormal; Notable for the following components:   Glucose-Capillary 166 (*)    All other components within normal limits  POCT I-STAT EG7 - Abnormal; Notable for the following components:   pH, Ven 7.464 (*)    pO2, Ven 165.0 (*)    Bicarbonate 38.6 (*)    TCO2 40 (*)    Acid-Base Excess 13.0 (*)    Potassium 3.3 (*)    Calcium, Ion 1.03 (*)    HCT 33.0 (*)    Hemoglobin 11.2 (*)    All other components within normal limits  URINALYSIS, ROUTINE W REFLEX MICROSCOPIC  TROPONIN I (HIGH SENSITIVITY)  TROPONIN I (HIGH SENSITIVITY)                                                                                                                         EKG  EKG Interpretation  Date/Time:  Sunday March 26 2020 23:20:36 EDT Ventricular Rate:  83 PR Interval:    QRS Duration: 95 QT Interval:  378 QTC Calculation: 445 R Axis:   70 Text  Interpretation: Sinus rhythm Probable left atrial enlargement Anteroseptal infarct, age indeterminate Artifact Otherwise no significant change Confirmed by Addison Lank [phone]) on 03/26/2020 11:28:23 PM      Radiology DG Chest 2 View  Result Date: 03/27/2020 CLINICAL DATA:  Cough and shortness of breath. EXAM: CHEST - 2 VIEW COMPARISON:  03/04/2020 FINDINGS: Aortic calcifications are noted. There is no pneumothorax. No large pleural effusion. The lungs are hyperexpanded. Multilevel compression fractures are noted throughout the thoracic spine as before. There is diffuse osteopenia of the visualized osseous structures. IMPRESSION: 1. No acute cardiopulmonary process. 2. COPD. Electronically Signed   By: Constance Holster M.D.   On: 03/27/2020 00:22   CT Head Wo Contrast  Result Date: 03/27/2020 CLINICAL DATA:  Syncope EXAM: CT HEAD WITHOUT CONTRAST TECHNIQUE: Contiguous axial images were obtained from the base of the skull through the vertex without intravenous contrast. COMPARISON:  None. FINDINGS: Brain: There is no mass, hemorrhage or extra-axial collection. The size and configuration of the ventricles and extra-axial CSF spaces are normal. The brain parenchyma is normal, without acute or chronic infarction. Vascular: No abnormal hyperdensity of the major intracranial arteries or dural venous sinuses. No intracranial atherosclerosis. Skull: The visualized skull base, calvarium and extracranial soft tissues are normal. Sinuses/Orbits: No fluid levels or advanced mucosal thickening of the visualized paranasal sinuses. No mastoid or middle ear effusion. The orbits are normal. IMPRESSION: Normal head CT. Electronically Signed   By: Ulyses Jarred M.D.   On: 03/27/2020 02:22   CT Angio Chest PE W and/or Wo Contrast  Result Date: 03/27/2020 CLINICAL DATA:  Syncope. EXAM: CT ANGIOGRAPHY CHEST WITH CONTRAST TECHNIQUE: Multidetector CT imaging of the chest was performed using the standard protocol during bolus  administration of intravenous contrast. Multiplanar CT image reconstructions and MIPs were obtained to evaluate the vascular anatomy. CONTRAST:  73mL OMNIPAQUE  IOHEXOL 350 MG/ML SOLN COMPARISON:  02/03/2020 FINDINGS: Cardiovascular: Contrast injection is sufficient to demonstrate satisfactory opacification of the pulmonary arteries to the segmental level. There is no pulmonary embolus. The main pulmonary artery is within normal limits for size. The heart size is normal. Coronary artery calcifications are noted. There are mild atherosclerotic changes of the thoracic aorta without evidence for an aneurysm. Mediastinum/Nodes: --No mediastinal or hilar lymphadenopathy. --No axillary lymphadenopathy. --No supraclavicular lymphadenopathy. --Normal thyroid gland. --The esophagus is unremarkable Lungs/Pleura: Emphysematous changes are noted bilaterally. There is pleuroparenchymal scarring at the lung apices. There is no pneumothorax. No focal infiltrate. Upper Abdomen: No acute abnormality. Musculoskeletal: Again noted are multiple compression fractures throughout the thoracic spine, all essentially stable from prior study. These include fractures of the T12, T10, T8, T7, and T5 vertebral bodies. Review of the MIP images confirms the above findings. IMPRESSION: 1. No acute pulmonary embolism. 2. No acute cardiopulmonary process identified. 3. Multilevel compression fractures of thoracic spine as detailed above, not significantly changed from prior study. Aortic Atherosclerosis (ICD10-I70.0) and Emphysema (ICD10-J43.9). Electronically Signed   By: Constance Holster M.D.   On: 03/27/2020 02:22    Pertinent labs & imaging results that were available during my care of the patient were reviewed by me and considered in my medical decision making (see chart for details).  Medications Ordered in ED Medications  albuterol (VENTOLIN HFA) 108 (90 Base) MCG/ACT inhaler 6 puff (6 puffs Inhalation Given 03/27/20 0021)  ipratropium  (ATROVENT HFA) inhaler 2 puff (2 puffs Inhalation Given 03/27/20 0021)  AeroChamber Plus Flo-Vu Large MISC 1 each (1 each Other Given 03/27/20 0022)  iohexol (OMNIPAQUE) 350 MG/ML injection 75 mL (75 mLs Intravenous Contrast Given 03/27/20 0205)  potassium chloride 10 mEq in 100 mL IVPB (0 mEq Intravenous Stopped 03/27/20 0402)                                                                                                                                    Procedures Procedures  (including critical care time)  Medical Decision Making / ED Course I have reviewed the nursing notes for this encounter and the patient's prior records (if available in EHR or on provided paperwork).   Nehemia Lenzi was evaluated in Emergency Department on 03/27/2020 for the symptoms described in the history of present illness. He was evaluated in the context of the global COVID-19 pandemic, which necessitated consideration that the patient might be at risk for infection with the SARS-CoV-2 virus that causes COVID-19. Institutional protocols and algorithms that pertain to the evaluation of patients at risk for COVID-19 are in a state of rapid change based on information released by regulatory bodies including the CDC and federal and state organizations. These policies and algorithms were followed during the patient's care in the ED.  Patient presents after a syncopal episode, appears to be orthostasis related. He does endorse some mild shortness of breath and chest tightness  related to his COPD.  Improved with breathing treatments by EMS.  EKG without acute ischemic changes, dysrhythmias or blocks. Serial troponins negative x2. Gas without evidence of hypercarbia. Labs notable for mild hypokalemia, repleted IV. No significant anemia.  CT head obtained to assess for ICH which was negative.  Chest x-ray without evidence of pneumonia or obvious pneumothorax.  CT PE study negative for PE, pneumonia or pneumothorax.  Patient was  provided with single dose of albuterol and atrovent puffs. sats were stable on his home 2L [place]. Patient's RR is low 20s when alone, increases when I walk in the room. There appears to be a component of anxiety regarding his increased WOB, which we were able to improve with therapeutic breathing exercises.   Patient ambulated w/o significant desaturations. Felt stable for discharge home with close follow up.       Final Clinical Impression(s) / ED Diagnoses Final diagnoses:  Orthostasis  Pulmonary emphysema, unspecified emphysema type ([place])    The patient appears reasonably screened and/or stabilized for discharge and I doubt any other medical condition or other [facility] requiring further screening, evaluation, or treatment in the ED at this time prior to discharge. Safe for discharge with strict return precautions.  Disposition: Discharge  Condition: Good  I have discussed the results, Dx and Tx plan with the patient/family who expressed understanding and agree(s) with the plan. Discharge instructions discussed at length. The patient/family was given strict return precautions who verbalized understanding of the instructions. No further questions at time of discharge.    ED Discharge Orders    None       Follow Up: [name], MD [address] [address] [phone]  Schedule an appointment as soon as possible for a visit  As needed  Pulmonology  Go on 04/06/2020 as scheduled to discuss continue COPD management     This chart was dictated using voice recognition software.  Despite best efforts to proofread,  errors can occur which can change the documentation meaning.   Fatima Blank, MD 03/27/20 [phone]

## 2020-03-27 ENCOUNTER — Emergency Department (HOSPITAL_COMMUNITY): Payer: Medicare Other

## 2020-03-27 ENCOUNTER — Telehealth: Payer: Self-pay | Admitting: Adult Health

## 2020-03-27 DIAGNOSIS — J9611 Chronic respiratory failure with hypoxia: Secondary | ICD-10-CM

## 2020-03-27 DIAGNOSIS — R55 Syncope and collapse: Secondary | ICD-10-CM | POA: Diagnosis not present

## 2020-03-27 DIAGNOSIS — R05 Cough: Secondary | ICD-10-CM | POA: Diagnosis not present

## 2020-03-27 DIAGNOSIS — R0602 Shortness of breath: Secondary | ICD-10-CM | POA: Diagnosis not present

## 2020-03-27 DIAGNOSIS — I951 Orthostatic hypotension: Secondary | ICD-10-CM | POA: Diagnosis not present

## 2020-03-27 LAB — POCT I-STAT EG7
Acid-Base Excess: 13 mmol/L — ABNORMAL HIGH (ref 0.0–2.0)
Bicarbonate: 38.6 mmol/L — ABNORMAL HIGH (ref 20.0–28.0)
Calcium, Ion: 1.03 mmol/L — ABNORMAL LOW (ref 1.15–1.40)
HCT: 33 % — ABNORMAL LOW (ref 39.0–52.0)
Hemoglobin: 11.2 g/dL — ABNORMAL LOW (ref 13.0–17.0)
O2 Saturation: 100 %
Patient temperature: 99.6
Potassium: 3.3 mmol/L — ABNORMAL LOW (ref 3.5–5.1)
Sodium: 136 mmol/L (ref 135–145)
TCO2: 40 mmol/L — ABNORMAL HIGH (ref 22–32)
pCO2, Ven: 54.1 mmHg (ref 44.0–60.0)
pH, Ven: 7.464 — ABNORMAL HIGH (ref 7.250–7.430)
pO2, Ven: 165 mmHg — ABNORMAL HIGH (ref 32.0–45.0)

## 2020-03-27 LAB — BASIC METABOLIC PANEL
Anion gap: 13 (ref 5–15)
BUN: 11 mg/dL (ref 8–23)
CO2: 33 mmol/L — ABNORMAL HIGH (ref 22–32)
Calcium: 8.3 mg/dL — ABNORMAL LOW (ref 8.9–10.3)
Chloride: 92 mmol/L — ABNORMAL LOW (ref 98–111)
Creatinine, Ser: 0.8 mg/dL (ref 0.61–1.24)
GFR calc Af Amer: >60 mL/min (ref >60)
GFR calc non Af Amer: >60 mL/min (ref >60)
Glucose, Bld: 163 mg/dL — ABNORMAL HIGH (ref 70–99)
Potassium: 3.2 mmol/L — ABNORMAL LOW (ref 3.5–5.1)
Sodium: 138 mmol/L (ref 135–145)

## 2020-03-27 LAB — TROPONIN I (HIGH SENSITIVITY)
Troponin I (High Sensitivity): 8 ng/L (ref <18)
Troponin I (High Sensitivity): 8 ng/L (ref <18)

## 2020-03-27 MED ORDER — POTASSIUM CHLORIDE 10 MEQ/100ML IV SOLN
10.0000 meq | Freq: Once | INTRAVENOUS | Status: AC
  Administered 2020-03-27: 10 meq via INTRAVENOUS
  Filled 2020-03-27: qty 100

## 2020-03-27 MED ORDER — IOHEXOL 350 MG/ML SOLN
75.0000 mL | Freq: Once | INTRAVENOUS | Status: AC | PRN
  Administered 2020-03-27: 75 mL via INTRAVENOUS

## 2020-03-27 NOTE — ED Notes (Signed)
Ambulated pt without O2. SPO2 initially was 100% on room air, then dropped to 88%. After ambulating, placed pt back on O2 and increased flow to 2.67mL. Pts SPO2 then went back to 100% after increase.

## 2020-03-27 NOTE — ED Notes (Signed)
Pt transported to CT ?

## 2020-03-27 NOTE — ED Notes (Signed)
Patient verbalizes understanding of discharge instructions. Opportunity for questioning and answers were provided. Armband removed by staff, pt discharged from ED ain wheelchair to home.

## 2020-03-27 NOTE — ED Notes (Signed)
Pt returned from XRAY 

## 2020-03-27 NOTE — Discharge Instructions (Signed)
You may increase your O2 level while walking to 3L or 4L for comfort. While at rest, decrease O2 level to 2L or lower.

## 2020-03-27 NOTE — Telephone Encounter (Signed)
Does he not already have o2?? LMTCB for [name]

## 2020-03-27 NOTE — ED Notes (Signed)
Pt unable to provide sample at this time.

## 2020-03-28 ENCOUNTER — Telehealth: Payer: Self-pay

## 2020-03-28 NOTE — Telephone Encounter (Signed)
Paul [name] called to report that Paul [name] was back in the ED on Sunday.  He was found to be dehydrated.  He did not require hospitalization.  She is calling today to discuss changing his furosemide 40 mg to 1 daily instead of the bid dosing.  Dr. Tobie Poet agreed to the changes.  He is scheduled for follow-up with the Home Health nurse on Thursday and with Pulmonary next Thursday.  She was instructed to call us back if needed.

## 2020-03-28 NOTE — Telephone Encounter (Signed)
Yes that is fine for POC

## 2020-03-28 NOTE — Telephone Encounter (Signed)
POC order sent to [facility]  I spoke with the pt and notified him this was done

## 2020-03-28 NOTE — Telephone Encounter (Signed)
lmtcb for pt.  Per pt's chart the pt already has home O2. [facility] is a palliative referral in pts chart from 03/16/20.

## 2020-03-28 NOTE — Telephone Encounter (Signed)
Spoke with patient and his wife. She stated that the patient was told by TP on 3/25 that he needs to be on 2L of O2 at all times. He has a concentrator at home and has tanks to carry with him. He is requesting a POC so that he will not have to carry the tanks. He is established with [facility]. I did advise them that our local DMEs have a 6-8 month waitlist due to covid.   TP, please advise if you are ok with [place] placing an order for a POC. Thanks!

## 2020-03-29 MED ORDER — AZITHROMYCIN 250 MG PO TABS: ORAL_TABLET | ORAL | 0 refills | Status: DC

## 2020-03-29 MED ORDER — PREDNISONE 10 MG PO TABS: ORAL_TABLET | ORAL | 0 refills | Status: DC

## 2020-03-29 NOTE — Telephone Encounter (Signed)
[name], RN, called back and was informed that pt has been having trouble with his breathing. Pt c/o increase in SOB, prod cough with yellow/green mucus, decrease in appetite, and fatigue x 1 week. Pt recently went to ED on 4/4 for a syncopal episode and was d/c. Per pt his spo2 was in the 70s on 2lpm earlier and was given a neb tx and then spo2 increased to mid 90s on 2lpm. Pt states any time he exerts himself his spo2 drops to 80s on 2lpm. While on the phone pt was at rest and spo2 was 96% on 2lpm and HR was 87. Per [name] pt has diminished lower lobes. Pt is taking 20mg  pred and 40mg  lasix daily. Pt denies f/c/s, swelling, CP/tightness. Pt is A&O. There are no current openings in clinic. Pt last seen by TP on 03/16/20.   Dr. Melvyn Novas please advise. Thanks.

## 2020-03-29 NOTE — Telephone Encounter (Signed)
Spoke with [name]. She verbalized understanding of recommendations. She stated that patient had already taken 20mg  of prednisone today and she wanted to know if he needed to still take the 40mg . I advised her to have him to wait until tomorrow to start the 40mg . Will go ahead and call in the zpak and prednisone to [facility] in [place].   Nothing further needed at time of call.

## 2020-03-29 NOTE — Telephone Encounter (Signed)
zpak Prednisone 10 mg take  4 each am x 2 days,   2 each am x 2 days,  1 each am x 2 days and stop   If can get comfortable on 02 at rest then no need for er, o/w to ER   Ov with all meds/inhalers in hand to see me next avail

## 2020-03-29 NOTE — Addendum Note (Signed)
Addended by: Valerie Salts on: 03/29/2020 12:22 PM   Modules accepted: Orders

## 2020-03-31 ENCOUNTER — Telehealth: Payer: Self-pay | Admitting: Internal Medicine

## 2020-03-31 NOTE — Telephone Encounter (Signed)
I called and spoke with Paul [name] and she states that she spoke with patient's wife. Since she called she has spoken with the patient and since taking the immodium the diarrhea has stops and he has normal stool. She states that he has had lose stool for 2 days and this morning it started as diarrhea. She also states that his oxygen sats are up and down with exertion. He is currently on  Z-pack and prednisone for exacerbation. She states that he is using his nebulizers every 4 hours and staying hydrated. Please advise.

## 2020-03-31 NOTE — Telephone Encounter (Signed)
LMTCB for [employer]

## 2020-03-31 NOTE — Telephone Encounter (Signed)
[name] returning missed call. Can be reached at [phone]

## 2020-03-31 NOTE — Telephone Encounter (Signed)
I called and spoke with patient's wife and made her aware of Dr. Gustavus Bryant recommendations and she verbalized understanding.

## 2020-03-31 NOTE — Telephone Encounter (Signed)
zmax may cause mild diarrhea usually just a probiotic otc and avoid dairy products,salads, undercooked vegetables will do and if not just go with chicken or beef broth and crackers/noodles until the diarrhea resolves. If worse over the w/e just go to ER

## 2020-04-01 ENCOUNTER — Other Ambulatory Visit: Payer: Self-pay

## 2020-04-01 ENCOUNTER — Emergency Department (HOSPITAL_COMMUNITY): Payer: Medicare Other

## 2020-04-01 ENCOUNTER — Inpatient Hospital Stay (HOSPITAL_COMMUNITY)
Admission: EM | Admit: 2020-04-01 | Discharge: 2020-04-06 | DRG: 190 | Disposition: A | Discharge status: Home Health | Payer: Medicare Other | Attending: Internal Medicine | Admitting: Internal Medicine

## 2020-04-01 ENCOUNTER — Encounter (HOSPITAL_COMMUNITY): Payer: Self-pay

## 2020-04-01 DIAGNOSIS — Z20822 Contact with and (suspected) exposure to covid-19: Secondary | ICD-10-CM | POA: Diagnosis present

## 2020-04-01 DIAGNOSIS — R55 Syncope and collapse: Secondary | ICD-10-CM

## 2020-04-01 DIAGNOSIS — E876 Hypokalemia: Secondary | ICD-10-CM | POA: Diagnosis not present

## 2020-04-01 DIAGNOSIS — I110 Hypertensive heart disease with heart failure: Secondary | ICD-10-CM | POA: Diagnosis present

## 2020-04-01 DIAGNOSIS — E1169 Type 2 diabetes mellitus with other specified complication: Secondary | ICD-10-CM | POA: Diagnosis not present

## 2020-04-01 DIAGNOSIS — Z7951 Long term (current) use of inhaled steroids: Secondary | ICD-10-CM | POA: Diagnosis not present

## 2020-04-01 DIAGNOSIS — Z809 Family history of malignant neoplasm, unspecified: Secondary | ICD-10-CM | POA: Diagnosis not present

## 2020-04-01 DIAGNOSIS — R197 Diarrhea, unspecified: Secondary | ICD-10-CM | POA: Diagnosis present

## 2020-04-01 DIAGNOSIS — R0602 Shortness of breath: Secondary | ICD-10-CM | POA: Diagnosis not present

## 2020-04-01 DIAGNOSIS — F411 Generalized anxiety disorder: Secondary | ICD-10-CM | POA: Diagnosis present

## 2020-04-01 DIAGNOSIS — M810 Age-related osteoporosis without current pathological fracture: Secondary | ICD-10-CM | POA: Diagnosis present

## 2020-04-01 DIAGNOSIS — I480 Paroxysmal atrial fibrillation: Secondary | ICD-10-CM | POA: Diagnosis not present

## 2020-04-01 DIAGNOSIS — I959 Hypotension, unspecified: Secondary | ICD-10-CM | POA: Diagnosis not present

## 2020-04-01 DIAGNOSIS — Z7901 Long term (current) use of anticoagulants: Secondary | ICD-10-CM | POA: Diagnosis not present

## 2020-04-01 DIAGNOSIS — E11649 Type 2 diabetes mellitus with hypoglycemia without coma: Secondary | ICD-10-CM | POA: Diagnosis not present

## 2020-04-01 DIAGNOSIS — R109 Unspecified abdominal pain: Secondary | ICD-10-CM

## 2020-04-01 DIAGNOSIS — T3695XA Adverse effect of unspecified systemic antibiotic, initial encounter: Secondary | ICD-10-CM | POA: Diagnosis present

## 2020-04-01 DIAGNOSIS — Z8249 Family history of ischemic heart disease and other diseases of the circulatory system: Secondary | ICD-10-CM

## 2020-04-01 DIAGNOSIS — I951 Orthostatic hypotension: Secondary | ICD-10-CM

## 2020-04-01 DIAGNOSIS — D638 Anemia in other chronic diseases classified elsewhere: Secondary | ICD-10-CM | POA: Diagnosis present

## 2020-04-01 DIAGNOSIS — T380X5A Adverse effect of glucocorticoids and synthetic analogues, initial encounter: Secondary | ICD-10-CM | POA: Diagnosis not present

## 2020-04-01 DIAGNOSIS — R0902 Hypoxemia: Secondary | ICD-10-CM | POA: Diagnosis not present

## 2020-04-01 DIAGNOSIS — E782 Mixed hyperlipidemia: Secondary | ICD-10-CM | POA: Diagnosis not present

## 2020-04-01 DIAGNOSIS — R4020 Unspecified coma: Secondary | ICD-10-CM | POA: Diagnosis not present

## 2020-04-01 DIAGNOSIS — I5032 Chronic diastolic (congestive) heart failure: Secondary | ICD-10-CM | POA: Diagnosis not present

## 2020-04-01 DIAGNOSIS — E785 Hyperlipidemia, unspecified: Secondary | ICD-10-CM | POA: Diagnosis present

## 2020-04-01 DIAGNOSIS — E1165 Type 2 diabetes mellitus with hyperglycemia: Secondary | ICD-10-CM | POA: Diagnosis not present

## 2020-04-01 DIAGNOSIS — J441 Chronic obstructive pulmonary disease with (acute) exacerbation: Secondary | ICD-10-CM | POA: Diagnosis not present

## 2020-04-01 DIAGNOSIS — J9601 Acute respiratory failure with hypoxia: Secondary | ICD-10-CM | POA: Diagnosis not present

## 2020-04-01 DIAGNOSIS — Z888 Allergy status to other drugs, medicaments and biological substances status: Secondary | ICD-10-CM

## 2020-04-01 DIAGNOSIS — R270 Ataxia, unspecified: Secondary | ICD-10-CM | POA: Diagnosis not present

## 2020-04-01 DIAGNOSIS — Z87891 Personal history of nicotine dependence: Secondary | ICD-10-CM

## 2020-04-01 DIAGNOSIS — I361 Nonrheumatic tricuspid (valve) insufficiency: Secondary | ICD-10-CM | POA: Diagnosis not present

## 2020-04-01 DIAGNOSIS — E860 Dehydration: Secondary | ICD-10-CM | POA: Diagnosis not present

## 2020-04-01 LAB — GLUCOSE, CAPILLARY: Glucose-Capillary: 280 mg/dL — ABNORMAL HIGH (ref 70–99)

## 2020-04-01 LAB — BASIC METABOLIC PANEL
Anion gap: 12 (ref 5–15)
BUN: 12 mg/dL (ref 8–23)
CO2: 32 mmol/L (ref 22–32)
Calcium: 8.8 mg/dL — ABNORMAL LOW (ref 8.9–10.3)
Chloride: 97 mmol/L — ABNORMAL LOW (ref 98–111)
Creatinine, Ser: 0.77 mg/dL (ref 0.61–1.24)
GFR calc Af Amer: >60 mL/min (ref >60)
GFR calc non Af Amer: >60 mL/min (ref >60)
Glucose, Bld: 181 mg/dL — ABNORMAL HIGH (ref 70–99)
Potassium: 4.1 mmol/L (ref 3.5–5.1)
Sodium: 141 mmol/L (ref 135–145)

## 2020-04-01 LAB — RESPIRATORY PANEL BY RT PCR (FLU A&B, COVID)
Influenza A by PCR: NEGATIVE
Influenza B by PCR: NEGATIVE
SARS Coronavirus 2 by RT PCR: NEGATIVE

## 2020-04-01 LAB — CBC WITH DIFFERENTIAL/PLATELET
Abs Immature Granulocytes: 0.18 10*3/uL — ABNORMAL HIGH (ref 0.00–0.07)
Basophils Absolute: 0 10*3/uL (ref 0.0–0.1)
Basophils Relative: 0 %
Eosinophils Absolute: 0 10*3/uL (ref 0.0–0.5)
Eosinophils Relative: 0 %
HCT: 36 % — ABNORMAL LOW (ref 39.0–52.0)
Hemoglobin: 11 g/dL — ABNORMAL LOW (ref 13.0–17.0)
Immature Granulocytes: 1 %
Lymphocytes Relative: 4 %
Lymphs Abs: 0.6 10*3/uL — ABNORMAL LOW (ref 0.7–4.0)
MCH: 25.9 pg — ABNORMAL LOW (ref 26.0–34.0)
MCHC: 30.6 g/dL (ref 30.0–36.0)
MCV: 84.7 fL (ref 80.0–100.0)
Monocytes Absolute: 0.4 10*3/uL (ref 0.1–1.0)
Monocytes Relative: 2 %
Neutro Abs: 15.5 10*3/uL — ABNORMAL HIGH (ref 1.7–7.7)
Neutrophils Relative %: 93 %
Platelets: 417 10*3/uL — ABNORMAL HIGH (ref 150–400)
RBC: 4.25 MIL/uL (ref 4.22–5.81)
RDW: 17.1 % — ABNORMAL HIGH (ref 11.5–15.5)
WBC: 16.7 10*3/uL — ABNORMAL HIGH (ref 4.0–10.5)
nRBC: 0 % (ref 0.0–0.2)

## 2020-04-01 LAB — URINALYSIS, ROUTINE W REFLEX MICROSCOPIC
Bilirubin Urine: NEGATIVE
Glucose, UA: NEGATIVE mg/dL
Hgb urine dipstick: NEGATIVE
Ketones, ur: NEGATIVE mg/dL
Leukocytes,Ua: NEGATIVE
Nitrite: NEGATIVE
Protein, ur: NEGATIVE mg/dL
Specific Gravity, Urine: 1.006 (ref 1.005–1.030)
pH: 8 (ref 5.0–8.0)

## 2020-04-01 LAB — CBG MONITORING, ED: Glucose-Capillary: 182 mg/dL — ABNORMAL HIGH (ref 70–99)

## 2020-04-01 LAB — PROTIME-INR
INR: 1.1 (ref 0.8–1.2)
Prothrombin Time: 14.4 seconds (ref 11.4–15.2)

## 2020-04-01 MED ORDER — SODIUM CHLORIDE 0.9 % IV BOLUS
1000.0000 mL | Freq: Once | INTRAVENOUS | Status: AC
  Administered 2020-04-01: 1000 mL via INTRAVENOUS

## 2020-04-01 MED ORDER — FUROSEMIDE 40 MG PO TABS
40.0000 mg | ORAL_TABLET | Freq: Every day | ORAL | Status: DC
  Administered 2020-04-02 – 2020-04-06 (×5): 40 mg via ORAL
  Filled 2020-04-01 (×5): qty 1

## 2020-04-01 MED ORDER — ALBUTEROL (5 MG/ML) CONTINUOUS INHALATION SOLN
10.0000 mg/h | INHALATION_SOLUTION | Freq: Once | RESPIRATORY_TRACT | Status: AC
  Administered 2020-04-01: 10 mg/h via RESPIRATORY_TRACT

## 2020-04-01 MED ORDER — LORAZEPAM 0.5 MG PO TABS
0.5000 mg | ORAL_TABLET | Freq: Two times a day (BID) | ORAL | Status: DC
  Administered 2020-04-01 – 2020-04-06 (×10): 0.5 mg via ORAL
  Filled 2020-04-01 (×10): qty 1

## 2020-04-01 MED ORDER — GUAIFENESIN ER 600 MG PO TB12
600.0000 mg | ORAL_TABLET | Freq: Two times a day (BID) | ORAL | Status: DC
  Administered 2020-04-01 – 2020-04-06 (×10): 600 mg via ORAL
  Filled 2020-04-01 (×10): qty 1

## 2020-04-01 MED ORDER — DILTIAZEM HCL ER COATED BEADS 180 MG PO CP24
360.0000 mg | ORAL_CAPSULE | Freq: Every day | ORAL | Status: DC
  Administered 2020-04-02 – 2020-04-06 (×5): 360 mg via ORAL
  Filled 2020-04-01 (×5): qty 2

## 2020-04-01 MED ORDER — METHYLPREDNISOLONE SODIUM SUCC 125 MG IJ SOLR
125.0000 mg | Freq: Once | INTRAMUSCULAR | Status: AC
  Administered 2020-04-01: 125 mg via INTRAVENOUS
  Filled 2020-04-01: qty 2

## 2020-04-01 MED ORDER — SODIUM CHLORIDE 0.9% FLUSH
3.0000 mL | Freq: Two times a day (BID) | INTRAVENOUS | Status: DC
  Administered 2020-04-02 – 2020-04-06 (×6): 3 mL via INTRAVENOUS

## 2020-04-01 MED ORDER — APIXABAN 5 MG PO TABS
5.0000 mg | ORAL_TABLET | Freq: Two times a day (BID) | ORAL | Status: DC
  Administered 2020-04-01 – 2020-04-06 (×10): 5 mg via ORAL
  Filled 2020-04-01 (×10): qty 1

## 2020-04-01 MED ORDER — DILTIAZEM HCL ER COATED BEADS 180 MG PO CP24: 360.0000 mg | ORAL_CAPSULE | Freq: Every day | ORAL | Status: DC

## 2020-04-01 MED ORDER — ESCITALOPRAM OXALATE 10 MG PO TABS
5.0000 mg | ORAL_TABLET | Freq: Every day | ORAL | Status: DC
  Administered 2020-04-02 – 2020-04-06 (×5): 5 mg via ORAL
  Filled 2020-04-01 (×5): qty 1

## 2020-04-01 MED ORDER — ACETAMINOPHEN 650 MG RE SUPP: 650.0000 mg | Freq: Four times a day (QID) | RECTAL | Status: DC | PRN

## 2020-04-01 MED ORDER — ONDANSETRON HCL 4 MG PO TABS: 4.0000 mg | ORAL_TABLET | Freq: Four times a day (QID) | ORAL | Status: DC | PRN

## 2020-04-01 MED ORDER — ONDANSETRON HCL 4 MG/2ML IJ SOLN: 4.0000 mg | Freq: Four times a day (QID) | INTRAMUSCULAR | Status: DC | PRN

## 2020-04-01 MED ORDER — LEVALBUTEROL HCL 0.63 MG/3ML IN NEBU: 0.6300 mg | INHALATION_SOLUTION | Freq: Four times a day (QID) | RESPIRATORY_TRACT | Status: DC | PRN

## 2020-04-01 MED ORDER — SODIUM CHLORIDE 0.9 % IV SOLN: INTRAVENOUS | Status: DC

## 2020-04-01 MED ORDER — ALBUTEROL SULFATE HFA 108 (90 BASE) MCG/ACT IN AERS
2.0000 | INHALATION_SPRAY | Freq: Once | RESPIRATORY_TRACT | Status: AC
  Administered 2020-04-01: 2 via RESPIRATORY_TRACT
  Filled 2020-04-01: qty 6.7

## 2020-04-01 MED ORDER — INSULIN ASPART 100 UNIT/ML ~~LOC~~ SOLN
0.0000 [IU] | Freq: Three times a day (TID) | SUBCUTANEOUS | Status: DC
  Administered 2020-04-02 (×2): 2 [IU] via SUBCUTANEOUS
  Administered 2020-04-02: 1 [IU] via SUBCUTANEOUS
  Administered 2020-04-03: 2 [IU] via SUBCUTANEOUS
  Administered 2020-04-03: 18:00:00 3 [IU] via SUBCUTANEOUS
  Administered 2020-04-03: 1 [IU] via SUBCUTANEOUS

## 2020-04-01 MED ORDER — DOXYCYCLINE HYCLATE 100 MG PO TABS
100.0000 mg | ORAL_TABLET | Freq: Two times a day (BID) | ORAL | Status: DC
  Administered 2020-04-01 – 2020-04-02 (×2): 100 mg via ORAL
  Filled 2020-04-01 (×2): qty 1

## 2020-04-01 MED ORDER — ACETAMINOPHEN 325 MG PO TABS: 650.0000 mg | ORAL_TABLET | Freq: Four times a day (QID) | ORAL | Status: DC | PRN

## 2020-04-01 MED ORDER — ROFLUMILAST 500 MCG PO TABS
250.0000 ug | ORAL_TABLET | Freq: Every day | ORAL | Status: DC
  Administered 2020-04-02 – 2020-04-06 (×5): 250 ug via ORAL
  Filled 2020-04-01 (×5): qty 1

## 2020-04-01 MED ORDER — MELATONIN 3 MG PO TABS
9.0000 mg | ORAL_TABLET | Freq: Every day | ORAL | Status: DC
  Administered 2020-04-01 – 2020-04-05 (×5): 9 mg via ORAL
  Filled 2020-04-01 (×5): qty 3

## 2020-04-01 MED ORDER — PREDNISONE 20 MG PO TABS
40.0000 mg | ORAL_TABLET | Freq: Every day | ORAL | Status: AC
  Administered 2020-04-03 – 2020-04-06 (×4): 40 mg via ORAL
  Filled 2020-04-01 (×4): qty 2

## 2020-04-01 MED ORDER — MOMETASONE FURO-FORMOTEROL FUM 200-5 MCG/ACT IN AERO
2.0000 | INHALATION_SPRAY | Freq: Two times a day (BID) | RESPIRATORY_TRACT | Status: DC
  Administered 2020-04-01 – 2020-04-06 (×10): 2 via RESPIRATORY_TRACT
  Filled 2020-04-01: qty 8.8

## 2020-04-01 MED ORDER — LORATADINE 10 MG PO TABS
10.0000 mg | ORAL_TABLET | Freq: Every day | ORAL | Status: DC
  Administered 2020-04-02 – 2020-04-06 (×5): 10 mg via ORAL
  Filled 2020-04-01 (×5): qty 1

## 2020-04-01 MED ORDER — METHYLPREDNISOLONE SODIUM SUCC 125 MG IJ SOLR
60.0000 mg | Freq: Four times a day (QID) | INTRAMUSCULAR | Status: AC
  Administered 2020-04-01 – 2020-04-02 (×4): 60 mg via INTRAVENOUS
  Filled 2020-04-01 (×4): qty 2

## 2020-04-01 MED ORDER — IPRATROPIUM BROMIDE 0.02 % IN SOLN
0.5000 mg | Freq: Four times a day (QID) | RESPIRATORY_TRACT | Status: DC
  Administered 2020-04-02 (×2): 0.5 mg via RESPIRATORY_TRACT
  Filled 2020-04-01 (×2): qty 2.5

## 2020-04-01 MED ORDER — TIOTROPIUM BROMIDE MONOHYDRATE 2.5 MCG/ACT IN AERS: 2.0000 | INHALATION_SPRAY | Freq: Every day | RESPIRATORY_TRACT | Status: DC

## 2020-04-01 MED ORDER — MELATONIN 5 MG PO TABS: 10.0000 mg | ORAL_TABLET | Freq: Every day | ORAL | Status: DC

## 2020-04-01 MED ORDER — DILTIAZEM HCL ER BEADS 240 MG PO CP24: 360.0000 mg | ORAL_CAPSULE | Freq: Every day | ORAL | Status: DC

## 2020-04-01 MED ORDER — UMECLIDINIUM BROMIDE 62.5 MCG/INH IN AEPB
1.0000 | INHALATION_SPRAY | Freq: Every day | RESPIRATORY_TRACT | Status: DC
  Administered 2020-04-02 – 2020-04-06 (×5): 1 via RESPIRATORY_TRACT
  Filled 2020-04-01: qty 7

## 2020-04-01 MED ORDER — ROFLUMILAST 250 MCG PO TABS: 250.0000 ug | ORAL_TABLET | Freq: Every day | ORAL | Status: DC

## 2020-04-01 MED ORDER — BISOPROLOL FUMARATE 5 MG PO TABS
5.0000 mg | ORAL_TABLET | Freq: Every day | ORAL | Status: DC
  Administered 2020-04-02 – 2020-04-06 (×5): 5 mg via ORAL
  Filled 2020-04-01 (×5): qty 1

## 2020-04-01 MED ORDER — ATORVASTATIN CALCIUM 40 MG PO TABS
40.0000 mg | ORAL_TABLET | Freq: Every day | ORAL | Status: DC
  Administered 2020-04-02 – 2020-04-05 (×4): 40 mg via ORAL
  Filled 2020-04-01 (×4): qty 1

## 2020-04-01 MED ORDER — AEROCHAMBER PLUS FLO-VU LARGE MISC: 1.0000 | Freq: Once | Status: DC

## 2020-04-01 MED ORDER — PANTOPRAZOLE SODIUM 40 MG PO TBEC
40.0000 mg | DELAYED_RELEASE_TABLET | Freq: Every day | ORAL | Status: DC
  Administered 2020-04-02 – 2020-04-06 (×5): 40 mg via ORAL
  Filled 2020-04-01 (×5): qty 1

## 2020-04-01 NOTE — ED Notes (Signed)
Report given to diane rn on 3e

## 2020-04-01 NOTE — ED Notes (Signed)
Admitting at bedside 

## 2020-04-01 NOTE — H&P (Signed)
History and Physical    Paul [name] [id number] DOB: 1946-07-14 DOA: 04/01/2020  PCP: Paul Brome, MD  Patient coming from: Home  Chief Complaint: Syncope, SOB  HPI: Paul [name] is a 74 y.o. male with medical history significant of COPD, paroxymal A Fib, chronic diastolic heart failure, HLD, DM.  Patient has had multiple COPD exacerbation hospitalization in the past several months, January 12-19, February 10-15, February 24-28, and again in March 13-18. He presented to ED on April 4 after syncopal episode, was given IVF with improvement in orthostatics and discharged from the ED. He now presents today due to another syncopal episode as well as shortness of breath. Per EDP, wife had reported syncopal episode with LOC. However, patient denies LOC today, states that he felt dizzy and bit nauseous and sat down. He reports SOB with productive cough of yellow/green sputum. He denies any fever, chest pain, decreased oral intake, vomiting, abdominal pain.  He has had diarrhea for the past 4 days while he was on azithromycin.   ED Course: Patient was given IVF with improvement in orthostatic symptoms. However, on ambulation, he became very short of breath. WBC 16.7 (taking prednisone), Influenza and COVID negative. UA negative. CXR shows hyperinflation of lungs. TRH called for admission for COPD exacerbation.   Review of Systems: As per HPI. Otherwise, all other review of systems reviewed and are negative.   Past Medical History:  Diagnosis Date  . A-fib ([place])   . Anemia   . Atrial fibrillation ([place])   . Back pain   . COPD (chronic obstructive pulmonary disease) ([place])   . COPD (chronic obstructive pulmonary disease) ([place])   . Elevated PSA   . GAD (generalized anxiety disorder)   . High cholesterol   . Hypertension   . Impaired fasting glucose   . Osteoporosis     Past Surgical History:  Procedure Laterality Date  . CATARACT EXTRACTION Right   . SKIN SURGERY     shoulders and chest      reports that he quit smoking about 11 years ago. His smoking use included cigarettes. He has a 104.00 pack-year smoking history. He has never used smokeless tobacco. He reports current alcohol use. He reports that he does not use drugs.  Allergies  Allergen Reactions  . Alendronate Other (See Comments)    Jittery/nervous    Family History  Problem Relation Age of Onset  . Heart disease Brother   . Heart disease Mother   . Heart disease Father   . Cancer Paternal Uncle     Prior to Admission medications   Medication Sig Start Date End Date Taking? Authorizing Provider  albuterol (PROAIR HFA) 108 (90 Base) MCG/ACT inhaler Inhale 2 puffs into the lungs every 6 (six) hours as needed for wheezing or shortness of breath.     [provider]  albuterol (PROVENTIL) (2.5 MG/3ML) 0.083% nebulizer solution Take 2.5 mg by nebulization in the morning and at bedtime.    [provider]  apixaban (ELIQUIS) 5 MG TABS tablet Take 1 tablet (5 mg total) by mouth 2 (two) times daily. 12/27/19   Jerline Pain, MD  atorvastatin (LIPITOR) 40 MG tablet TAKE 1 TABLET(40 MG) BY MOUTH DAILY AFTER SUPPER Patient taking differently: Take 40 mg by mouth daily at 6 PM.  12/27/19   Jerline Pain, MD  azithromycin (ZITHROMAX) 250 MG tablet Take 2 tablets on first day, then 1 tablet daily until finished. 03/29/20   Tanda Rockers, MD  bisoprolol (  ZEBETA) 5 MG tablet Take 5 mg by mouth daily.    [provider]  budesonide-formoterol (SYMBICORT) 160-4.5 MCG/ACT inhaler Inhale 2 puffs into the lungs 2 (two) times daily.    [provider]  Calcium 200 MG TABS Take 600 mg by mouth daily.    [provider]  cholecalciferol (VITAMIN D3) 25 MCG (1000 UNIT) tablet Take 1,000 Units by mouth daily.    [provider]  denosumab (PROLIA) 60 MG/ML SOSY injection Inject 60 mg into the skin every 6 (six) months.    [provider]  diltiazem (TIAZAC) 360 MG 24 hr  capsule Take 1 capsule (360 mg total) by mouth daily. 02/24/20   Jerline Pain, MD  furosemide (LASIX) 40 MG tablet Take 1 tablet (40 mg total) by mouth daily. Patient taking differently: Take 20 mg by mouth daily.  12/27/19 03/26/20  Jerline Pain, MD  guaiFENesin (MUCINEX) 600 MG 12 hr tablet Take 600 mg by mouth 2 (two) times daily.    [provider]  loratadine (CLARITIN) 10 MG tablet Take 10 mg by mouth daily.    [provider]  LORazepam (ATIVAN) 0.5 MG tablet Take 0.5 mg by mouth every 8 (eight) hours as needed for anxiety.    [provider]  Melatonin 5 MG TABS Take 5 mg by mouth at bedtime.    [provider]  omeprazole (PRILOSEC) 20 MG capsule Take 20 mg by mouth in the morning and at bedtime.    [provider]  predniSONE (DELTASONE) 10 MG tablet Take 4 tabs x 2 days, 2 tabs x 2 days, then 1 tab x 2 days and stop. 03/29/20   Tanda Rockers, MD  Roflumilast (DALIRESP) 250 MCG TABS Take 250 mg by mouth at bedtime.    [provider]  Tiotropium Bromide Monohydrate (SPIRIVA RESPIMAT) 2.5 MCG/ACT AERS Inhale 2 puffs into the lungs daily. 02/24/20   Tanda Rockers, MD  vitamin C (ASCORBIC ACID) 250 MG tablet Take 500 mg by mouth daily.    [provider]    Physical Exam: Vitals:   04/01/20 1545 04/01/20 1600 04/01/20 1615 04/01/20 1630  BP: (!) 124/58 (!) 129/57 (!) 119/55 (!) 133/55  Pulse: 74 70 77 86  Resp: 20 18 20 19   Temp:      TempSrc:      SpO2: 100% 100% 100% 100%  Weight:      Height:          Constitutional: NAD, calm, comfortable Eyes: PERRL, lids and conjunctivae normal ENMT: Mucous membranes are moist. Normal dentition.  Respiratory: Diminished breath sounds bilaterally, no wheezing, no crackles. Normal respiratory effort. No accessory muscle use. No conversational dyspnea  Cardiovascular: Regular rate and rhythm, no murmurs. No extremity edema.  Abdomen: Soft, nondistended, nontender to palpation.  Bowel sounds positive.  Musculoskeletal: No joint deformity upper and lower extremities. No contractures. Normal muscle tone.  Skin: no rashes, lesions, ulcers on exposed skin  Neurologic: Alert and oriented, speech fluent. No focal deficits.   Psychiatric: Normal judgment and insight. Normal mood and affect   Labs on Admission: I have personally reviewed following labs and imaging studies  CBC: Recent Labs  Lab 03/26/20 2310 03/27/20 0036 04/01/20 1328  WBC 9.4  --  16.7*  NEUTROABS  --   --  15.5*  HGB 11.0* 11.2* 11.0*  HCT 34.6* 33.0* 36.0*  MCV 81.6  --  84.7  PLT 258  --  417*  Basic Metabolic Panel: Recent Labs  Lab 03/26/20 2310 03/27/20 0036 04/01/20 1328  NA 138 136 141  K 3.2* 3.3* 4.1  CL 92*  --  97*  CO2 33*  --  32  GLUCOSE 163*  --  181*  BUN 11  --  12  CREATININE 0.80  --  0.77  CALCIUM 8.3*  --  8.8*   GFR: Estimated Creatinine Clearance: 72.8 mL/min (by C-G formula based on SCr of 0.77 mg/dL). Liver Function Tests: No results for input(s): AST, ALT, ALKPHOS, BILITOT, PROT, ALBUMIN in the last 168 hours. No results for input(s): LIPASE, AMYLASE in the last 168 hours. No results for input(s): AMMONIA in the last 168 hours. Coagulation Profile: Recent Labs  Lab 04/01/20 1328  INR 1.1   Cardiac Enzymes: No results for input(s): CKTOTAL, CKMB, CKMBINDEX, TROPONINI in the last 168 hours. BNP (last 3 results) No results for input(s): PROBNP in the last 8760 hours. HbA1C: No results for input(s): HGBA1C in the last 72 hours. CBG: Recent Labs  Lab 03/26/20 2324 04/01/20 1317  GLUCAP 166* 182*   Lipid Profile: No results for input(s): CHOL, HDL, LDLCALC, TRIG, CHOLHDL, LDLDIRECT in the last 72 hours. Thyroid Function Tests: No results for input(s): TSH, T4TOTAL, FREET4, T3FREE, THYROIDAB in the last 72 hours. Anemia Panel: No results for input(s): VITAMINB12, FOLATE, FERRITIN, TIBC, IRON, RETICCTPCT in the last 72 hours. Urine analysis:     Component Value Date/Time   COLORURINE STRAW (A) 04/01/2020 1325   APPEARANCEUR CLEAR 04/01/2020 1325   LABSPEC 1.006 04/01/2020 1325   PHURINE 8.0 04/01/2020 1325   GLUCOSEU NEGATIVE 04/01/2020 1325   HGBUR NEGATIVE 04/01/2020 1325   BILIRUBINUR NEGATIVE 04/01/2020 1325   KETONESUR NEGATIVE 04/01/2020 1325   PROTEINUR NEGATIVE 04/01/2020 1325   NITRITE NEGATIVE 04/01/2020 1325   LEUKOCYTESUR NEGATIVE 04/01/2020 1325   Sepsis Labs: !!!!!!!!!!!!!!!!!!!!!!!!!!!!!!!!!!!!!!!!!!!! @LABRCNTIP (procalcitonin:4,lacticidven:4) ) Recent Results (from the past 240 hour(s))  Respiratory Panel by RT PCR (Flu A&B, Covid) - Nasopharyngeal Swab     Status: None   Collection Time: 04/01/20  2:37 PM   Specimen: Nasopharyngeal Swab  Result Value Ref Range Status   SARS Coronavirus 2 by RT PCR NEGATIVE NEGATIVE Final    Comment: (NOTE) SARS-CoV-2 target nucleic acids are NOT DETECTED. The SARS-CoV-2 RNA is generally detectable in upper respiratoy specimens during the acute phase of infection. The lowest concentration of SARS-CoV-2 viral copies this assay can detect is 131 copies/mL. A negative result does not preclude SARS-Cov-2 infection and should not be used as the sole basis for treatment or other patient management decisions. A negative result may occur with  improper specimen collection/handling, submission of specimen other than nasopharyngeal swab, presence of viral mutation(s) within the areas targeted by this assay, and inadequate number of viral copies (<131 copies/mL). A negative result must be combined with clinical observations, patient history, and epidemiological information. The expected result is Negative. Fact Sheet for Patients:  PinkCheek.be Fact Sheet for Healthcare Providers:  GravelBags.it This test is not yet ap proved or cleared by the [place] FDA and  has been authorized for detection and/or diagnosis of  SARS-CoV-2 by FDA under an Emergency Use Authorization (EUA). This EUA will remain  in effect (meaning this test can be used) for the duration of the COVID-19 declaration under Section 564(b)(1) of the Act, 21 U.S.C. section 360bbb-3(b)(1), unless the authorization is terminated or revoked sooner.    Influenza A by PCR NEGATIVE NEGATIVE Final   Influenza B by PCR  NEGATIVE NEGATIVE Final    Comment: (NOTE) The Xpert Xpress SARS-CoV-2/FLU/RSV assay is intended as an aid in  the diagnosis of influenza from Nasopharyngeal swab specimens and  should not be used as a sole basis for treatment. Nasal washings and  aspirates are unacceptable for Xpert Xpress SARS-CoV-2/FLU/RSV  testing. Fact Sheet for Patients: PinkCheek.be Fact Sheet for Healthcare Providers: GravelBags.it This test is not yet approved or cleared by the [place] FDA and  has been authorized for detection and/or diagnosis of SARS-CoV-2 by  FDA under an Emergency Use Authorization (EUA). This EUA will remain  in effect (meaning this test can be used) for the duration of the  Covid-19 declaration under Section 564(b)(1) of the Act, 21  U.S.C. section 360bbb-3(b)(1), unless the authorization is  terminated or revoked. Performed at [facility], [place] [address] [place], [place] [address]      Radiological Exams on Admission: DG Chest Portable 1 View  Result Date: 04/01/2020 CLINICAL DATA:  Shortness of breath.  Syncope. EXAM: PORTABLE CHEST 1 VIEW COMPARISON:  March 27, 2020 FINDINGS: Hyperinflation of the lungs. The heart, hila, mediastinum, lungs, and pleura are otherwise stable and unremarkable. IMPRESSION: Hyperinflation of the lungs suggesting COPD or emphysema. No other abnormalities. Electronically Signed   By: Dorise Bullion III M.D   On: 04/01/2020 14:18    EKG: Independently reviewed. Normal sinus rhythm   Assessment/Plan Principal Problem:    COPD with acute exacerbation (HCC) Active Problems:   Paroxysmal atrial fibrillation (HCC)   HLD (hyperlipidemia)   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HCC)   Acute respiratory failure with hypoxia (HCC)   COPD exacerbation (HCC)   Syncope   Chronic diastolic CHF (congestive heart failure) (HCC)   COPD exacerbation -Symbicort, spiriva, roflumilast  -Solumedrol, doxycycline (patient with diarrhea on azithromycin)  -Breathing tx PRN   Acute hypoxemic respiratory failure -Currently requiring 3L [place] O2, wean as able -Home O2 desat screening to see if he will qualify for home O2 prior to discharge   Syncope -Check orthostatic vital sign  -Tele  -IVF   Paroxysmal A Fib -Continue eliquis, bisoprolol, diltiazem   Chronic diastolic HF -Continue lasix   HLD -Continue lipitor   DM type 2  -SSI   Anxiety -Lexapro, Ativan PRN     DVT prophylaxis: Eliquis Code Status: DNI.  Discussed with patient, he states that he would not want to be on a ventilator but would want to be resuscitated with CPR if needed Family Communication: No family at bedside Disposition Plan: Suspect return home once medically improved  Consults called: None  Admission status: Inpatient    Severity of Illness: The appropriate patient status for this patient is INPATIENT. Inpatient status is judged to be reasonable and necessary in order to provide the required intensity of service to ensure the patient's safety. The patient's presenting symptoms, physical exam findings, and initial radiographic and laboratory data in the context of their chronic comorbidities is felt to place them at high risk for further clinical deterioration. Furthermore, it is not anticipated that the patient will be medically stable for discharge from the hospital within 2 midnights of admission.   * I certify that at the point of admission it is my clinical judgment that the patient will require inpatient hospital care  spanning beyond 2 midnights from the point of admission due to high intensity of service, high risk for further deterioration and high frequency of surveillance required.Dessa Phi, DO [facility] 04/01/2020, 4:54 PM   Available via Epic secure chat 7am-7pm After these hours, please refer to coverage provider listed on [email or web address]

## 2020-04-01 NOTE — ED Provider Notes (Addendum)
[facility] EMERGENCY DEPARTMENT Provider Note   CSN: [id number] Arrival date & time: 04/01/20  1248     History Chief Complaint  Patient presents with  . Near Syncope    Paul [name] is a 74 y.o. male.  Pt presents to the ED today with a syncopal episode.  The pt was here on 4/4 for the same.  The pt was found to be orthostatic then and was given IVFs and felt better.  He was d/c home on zithromax for bronchitis.  He has had a lot of diarrhea since he's been on the abx.  Pt had another syncopal event that was witnessed by his wife.  He did have a loc and was incontinent of urine.  His oxygen disconnected during this fall (normally on 3L) and O2 sats were 76% upon EMS arrival.  Pt was orthostatic for EMS.  Pt is on Eliquis for afib, but did not hit his head.  His wife assisted him to the ground.  CHA2DS2/VAS Stroke Risk Points  Current as of 14 minutes ago     4 >= 2 Points: High Risk  1 - 1.99 Points: Medium Risk  0 Points: Low Risk    The previous score was 3 on 01/26/2020.: Last Change:     Details    This score determines the patient's risk of having a stroke if the  patient has atrial fibrillation.       Points Metrics  0 Has Congestive Heart Failure:  No    Current as of 14 minutes ago  1 Has Vascular Disease:  Yes    Current as of 14 minutes ago  1 Has Hypertension:  Yes    Current as of 14 minutes ago  1 Age:  35    Current as of 14 minutes ago  1 Has Diabetes:  Yes    Current as of 14 minutes ago  0 Had Stroke:  No  Had TIA:  No  Had thromboembolism:  No    Current as of 14 minutes ago  0 Male:  No    Current as of 14 minutes ago                 Past Medical History:  Diagnosis Date  . A-fib ([place])   . Anemia   . Atrial fibrillation ([place])   . Back pain   . COPD (chronic obstructive pulmonary disease) ([place])   . COPD (chronic obstructive pulmonary disease) ([place])   . Elevated PSA   . GAD (generalized anxiety disorder)   . High  cholesterol   . Hypertension   . Impaired fasting glucose   . Osteoporosis     Patient Active Problem List   Diagnosis Date Noted  . COPD exacerbation ([place]) 03/04/2020  . Acute respiratory failure with hypoxia ([place]) 02/17/2020  . Osteoporosis with current pathological fracture 02/06/2020  . Chronic cough 01/31/2020  . Essential hypertension 01/28/2020  . Encounter for general adult medical examination with abnormal findings 01/25/2020  . Chronic bilateral thoracic back pain 01/25/2020  . 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 HLD (hyperlipidemia) 01/05/2020  . Vertebral compression fracture ([place]) 05/29/2019  . Chronic anemia 05/29/2019  . Pulmonary nodules 05/29/2019  . COPD with acute exacerbation ([place]) 05/28/2019  . Chronic anticoagulation 04/06/2019  . Coronary artery calcification 04/06/2019  . Atherosclerosis of aorta ([place]) 04/06/2019  . Paroxysmal atrial fibrillation ([facility])   . Acute on chronic respiratory failure with hypoxia ([place])   .  Lung nodule < 6cm on CT 05/29/2016  . Former cigarette smoker 12/05/2015  . Chronic respiratory failure with hypoxia ([place]) 01/27/2013  . COPD GOLD  III      Past Surgical History:  Procedure Laterality Date  . CATARACT EXTRACTION Right   . SKIN SURGERY     shoulders and chest       Family History  Problem Relation Age of Onset  . Heart disease Brother   . Heart disease Mother   . Heart disease Father   . Cancer Paternal Uncle     Social History   Tobacco Use  . Smoking status: Former Smoker    Packs/day: 2.00    Years: 52.00    Pack years: 104.00    Types: Cigarettes    Quit date: 02/03/2009    Years since quitting: 11.1  . Smokeless tobacco: Never Used  . Tobacco comment: Counseled to remain smoke free  Substance Use Topics  . Alcohol use: Yes    Alcohol/week: 0.0 standard drinks    Comment: occ  . Drug use: No    Home Medications Prior to Admission medications   Medication Sig Start  Date End Date Taking? Authorizing Provider  albuterol (PROAIR HFA) 108 (90 Base) MCG/ACT inhaler Inhale 2 puffs into the lungs every 6 (six) hours as needed for wheezing or shortness of breath.     [provider]  albuterol (PROVENTIL) (2.5 MG/3ML) 0.083% nebulizer solution Take 2.5 mg by nebulization in the morning and at bedtime.    [provider]  apixaban (ELIQUIS) 5 MG TABS tablet Take 1 tablet (5 mg total) by mouth 2 (two) times daily. 12/27/19   Jerline Pain, MD  atorvastatin (LIPITOR) 40 MG tablet TAKE 1 TABLET(40 MG) BY MOUTH DAILY AFTER SUPPER Patient taking differently: Take 40 mg by mouth daily at 6 PM.  12/27/19   Jerline Pain, MD  azithromycin (ZITHROMAX) 250 MG tablet Take 2 tablets on first day, then 1 tablet daily until finished. 03/29/20   Tanda Rockers, MD  bisoprolol (ZEBETA) 5 MG tablet Take 5 mg by mouth daily.    [provider]  budesonide-formoterol (SYMBICORT) 160-4.5 MCG/ACT inhaler Inhale 2 puffs into the lungs 2 (two) times daily.    [provider]  Calcium 200 MG TABS Take 600 mg by mouth daily.    [provider]  cholecalciferol (VITAMIN D3) 25 MCG (1000 UNIT) tablet Take 1,000 Units by mouth daily.    [provider]  denosumab (PROLIA) 60 MG/ML SOSY injection Inject 60 mg into the skin every 6 (six) months.    [provider]  diltiazem (TIAZAC) 360 MG 24 hr capsule Take 1 capsule (360 mg total) by mouth daily. 02/24/20   Jerline Pain, MD  furosemide (LASIX) 40 MG tablet Take 1 tablet (40 mg total) by mouth daily. Patient taking differently: Take 20 mg by mouth daily.  12/27/19 03/26/20  Jerline Pain, MD  guaiFENesin (MUCINEX) 600 MG 12 hr tablet Take 600 mg by mouth 2 (two) times daily.    [provider]  loratadine (CLARITIN) 10 MG tablet Take 10 mg by mouth daily.    [provider]  LORazepam (ATIVAN) 0.5 MG tablet Take 0.5 mg by mouth every 8 (eight) hours as needed for anxiety.     [provider]  Melatonin 5 MG TABS Take 5 mg by mouth at bedtime.    [provider]  omeprazole (PRILOSEC) 20 MG capsule Take  20 mg by mouth in the morning and at bedtime.    [provider]  predniSONE (DELTASONE) 10 MG tablet Take 4 tabs x 2 days, 2 tabs x 2 days, then 1 tab x 2 days and stop. 03/29/20   Tanda Rockers, MD  Roflumilast (DALIRESP) 250 MCG TABS Take 250 mg by mouth at bedtime.    [provider]  Tiotropium Bromide Monohydrate (SPIRIVA RESPIMAT) 2.5 MCG/ACT AERS Inhale 2 puffs into the lungs daily. 02/24/20   Tanda Rockers, MD  vitamin C (ASCORBIC ACID) 250 MG tablet Take 500 mg by mouth daily.    [provider]    Allergies    Alendronate  Review of Systems   Review of Systems  Neurological: Positive for syncope.  All other systems reviewed and are negative.   Physical Exam Updated Vital Signs BP (!) 130/56   Pulse 73   Temp 98.1 F (36.7 C) (Oral)   Resp (!) 22   Ht 5\' 8"  (1.727 m)   Wt 63.5 kg   SpO2 100%   BMI 21.29 kg/m   Physical Exam Vitals and nursing note reviewed.  Constitutional:      Appearance: Normal appearance.  HENT:     Head: Normocephalic and atraumatic.     Right Ear: External ear normal.     Left Ear: External ear normal.     Nose: Nose normal.     Mouth/Throat:     Mouth: Mucous membranes are moist.     Pharynx: Oropharynx is clear.  Eyes:     Extraocular Movements: Extraocular movements intact.     Conjunctiva/sclera: Conjunctivae normal.     Pupils: Pupils are equal, round, and reactive to light.  Cardiovascular:     Rate and Rhythm: Normal rate and regular rhythm.     Pulses: Normal pulses.     Heart sounds: Normal heart sounds.  Pulmonary:     Effort: Tachypnea present.     Breath sounds: Wheezing present.  Abdominal:     General: Abdomen is flat. Bowel sounds are normal.     Palpations: Abdomen is soft.  Musculoskeletal:        General: Normal range of motion.      Cervical back: Normal range of motion and neck supple.  Skin:    General: Skin is warm.     Capillary Refill: Capillary refill takes less than 2 seconds.  Neurological:     General: No focal deficit present.     Mental Status: He is alert and oriented to person, place, and time.  Psychiatric:        Mood and Affect: Mood normal.        Behavior: Behavior normal.        Thought Content: Thought content normal.        Judgment: Judgment normal.     ED Results / Procedures / Treatments   Labs (all labs ordered are listed, but only abnormal results are displayed) Labs Reviewed  BASIC METABOLIC PANEL - Abnormal; Notable for the following components:      Result Value   Chloride 97 (*)    Glucose, Bld 181 (*)    Calcium 8.8 (*)    All other components within normal limits  CBC WITH DIFFERENTIAL/PLATELET - Abnormal; Notable for the following components:   WBC 16.7 (*)    Hemoglobin 11.0 (*)    HCT 36.0 (*)    MCH 25.9 (*)    RDW 17.1 (*)  Platelets 417 (*)    Neutro Abs 15.5 (*)    Lymphs Abs 0.6 (*)    Abs Immature Granulocytes 0.18 (*)    All other components within normal limits  URINALYSIS, ROUTINE W REFLEX MICROSCOPIC - Abnormal; Notable for the following components:   Color, Urine STRAW (*)    All other components within normal limits  CBG MONITORING, ED - Abnormal; Notable for the following components:   Glucose-Capillary 182 (*)    All other components within normal limits  RESPIRATORY PANEL BY RT PCR (FLU A&B, COVID)  PROTIME-INR    EKG EKG Interpretation  Date/Time:  Saturday April 01 2020 13:10:00 EDT Ventricular Rate:  79 PR Interval:    QRS Duration: 92 QT Interval:  373 QTC Calculation: 428 R Axis:   81 Text Interpretation: Sinus rhythm Probable left atrial enlargement Borderline right axis deviation Probable anteroseptal infarct, old No significant change since last tracing Confirmed by Isla Pence [phone]) on 04/01/2020 2:07:52 PM   Radiology  DG Chest Portable 1 View  Result Date: 04/01/2020 CLINICAL DATA:  Shortness of breath.  Syncope. EXAM: PORTABLE CHEST 1 VIEW COMPARISON:  March 27, 2020 FINDINGS: Hyperinflation of the lungs. The heart, hila, mediastinum, lungs, and pleura are otherwise stable and unremarkable. IMPRESSION: Hyperinflation of the lungs suggesting COPD or emphysema. No other abnormalities. Electronically Signed   By: Dorise Bullion III M.D   On: 04/01/2020 14:18    Procedures Procedures (including critical care time)  Medications Ordered in ED Medications  sodium chloride 0.9 % bolus 1,000 mL (0 mLs Intravenous Stopped 04/01/20 1440)    And  0.9 %  sodium chloride infusion ( Intravenous New Bag/Given 04/01/20 1440)  AeroChamber Plus Flo-Vu Large MISC 1 each (1 each Other Not Given 04/01/20 1441)  methylPREDNISolone sodium succinate (SOLU-MEDROL) 125 mg/2 mL injection 125 mg (125 mg Intravenous Given 04/01/20 1440)  albuterol (VENTOLIN HFA) 108 (90 Base) MCG/ACT inhaler 2 puff (2 puffs Inhalation Given 04/01/20 1440)  albuterol (PROVENTIL,VENTOLIN) solution continuous neb (10 mg/hr Nebulization Given 04/01/20 1603)    ED Course  I have reviewed the triage vital signs and the nursing notes.  Pertinent labs & imaging results that were available during my care of the patient were reviewed by me and considered in my medical decision making (see chart for details).    MDM Rules/Calculators/A&P                      Pt's breathing was labored, so he was given solumedrol and albuterol.  CXR without pna.    Pt's bp has improved with IVFs, but he is still very sob with any exertion.    Continuous albuterol neb ordered after Covid came back negative.  However, due to the repeat syncope and the sob, I spoke with Dr. Maylene Roes (triad)   about admission.  Pt d/w his wife per pt's request.  Final Clinical Impression(s) / ED Diagnoses Final diagnoses:  COPD exacerbation ([place])  Orthostatic syncope    Rx / DC Orders  ED Discharge Orders    None       Isla Pence, MD 04/01/20 [id number]    Isla Pence, MD 04/01/20 1615

## 2020-04-01 NOTE — ED Notes (Signed)
Mu;ti[lr calls on his caLL  Bell he is anxious

## 2020-04-01 NOTE — ED Notes (Signed)
Ambulated pt with pulse ox,  4L like he has been wearing. Oxygen between 95-100%. Pt had labored breathing, respirations of 35 and stated he felt short of breath and like he was wheezing a lot. Pts gait was steady with EMT assistance. Pt back in bed resting comfortably.

## 2020-04-01 NOTE — ED Triage Notes (Signed)
Pt to ED via EMS from home c/o syncopal episode, apparently pt had syncopal episode on Sunday brought to ED treated for dehydration. Another syncopal episode today. Witnessed by wife, pt did not fall, assisted down by wife. LOC, incontinent of urine, pt o2 disconnected during event- on EMS arrival o2 sats 76, pt placed back on home o2 at 3L. Pt has hx COPD, AFIB- Takes elaquis ,.LaST vs 128/90, 99% , HR 80, cbg:197,  Per EMS pt positive for orthostatic hypotension . #20 LAC- No medications given by EMS.

## 2020-04-01 NOTE — ED Notes (Signed)
pts dinner tray has not arrived yet  Pt waiting

## 2020-04-01 NOTE — Plan of Care (Signed)

## 2020-04-02 ENCOUNTER — Inpatient Hospital Stay (HOSPITAL_COMMUNITY): Payer: Medicare Other

## 2020-04-02 DIAGNOSIS — I361 Nonrheumatic tricuspid (valve) insufficiency: Secondary | ICD-10-CM

## 2020-04-02 DIAGNOSIS — J9601 Acute respiratory failure with hypoxia: Secondary | ICD-10-CM | POA: Diagnosis not present

## 2020-04-02 DIAGNOSIS — E1169 Type 2 diabetes mellitus with other specified complication: Secondary | ICD-10-CM | POA: Diagnosis not present

## 2020-04-02 DIAGNOSIS — I5032 Chronic diastolic (congestive) heart failure: Secondary | ICD-10-CM | POA: Diagnosis not present

## 2020-04-02 DIAGNOSIS — J441 Chronic obstructive pulmonary disease with (acute) exacerbation: Secondary | ICD-10-CM | POA: Diagnosis not present

## 2020-04-02 LAB — CBC
HCT: 29.1 % — ABNORMAL LOW (ref 39.0–52.0)
Hemoglobin: 9 g/dL — ABNORMAL LOW (ref 13.0–17.0)
MCH: 25.6 pg — ABNORMAL LOW (ref 26.0–34.0)
MCHC: 30.9 g/dL (ref 30.0–36.0)
MCV: 82.9 fL (ref 80.0–100.0)
Platelets: 354 10*3/uL (ref 150–400)
RBC: 3.51 MIL/uL — ABNORMAL LOW (ref 4.22–5.81)
RDW: 17.5 % — ABNORMAL HIGH (ref 11.5–15.5)
WBC: 8.8 10*3/uL (ref 4.0–10.5)
nRBC: 0 % (ref 0.0–0.2)

## 2020-04-02 LAB — BASIC METABOLIC PANEL
Anion gap: 8 (ref 5–15)
BUN: 10 mg/dL (ref 8–23)
CO2: 32 mmol/L (ref 22–32)
Calcium: 8 mg/dL — ABNORMAL LOW (ref 8.9–10.3)
Chloride: 103 mmol/L (ref 98–111)
Creatinine, Ser: 0.66 mg/dL (ref 0.61–1.24)
GFR calc Af Amer: >60 mL/min (ref >60)
GFR calc non Af Amer: >60 mL/min (ref >60)
Glucose, Bld: 177 mg/dL — ABNORMAL HIGH (ref 70–99)
Potassium: 3.6 mmol/L (ref 3.5–5.1)
Sodium: 143 mmol/L (ref 135–145)

## 2020-04-02 LAB — GLUCOSE, CAPILLARY
Glucose-Capillary: 157 mg/dL — ABNORMAL HIGH (ref 70–99)
Glucose-Capillary: 154 mg/dL — ABNORMAL HIGH (ref 70–99)
Glucose-Capillary: 129 mg/dL — ABNORMAL HIGH (ref 70–99)
Glucose-Capillary: 166 mg/dL — ABNORMAL HIGH (ref 70–99)

## 2020-04-02 LAB — ECHOCARDIOGRAM COMPLETE
Height: 68 in
Weight: 2121.6 oz

## 2020-04-02 MED ORDER — LOPERAMIDE HCL 2 MG PO CAPS
2.0000 mg | ORAL_CAPSULE | ORAL | Status: DC | PRN
  Administered 2020-04-02: 2 mg via ORAL
  Filled 2020-04-02: qty 1

## 2020-04-02 MED ORDER — ALBUTEROL SULFATE (2.5 MG/3ML) 0.083% IN NEBU
2.5000 mg | INHALATION_SOLUTION | Freq: Two times a day (BID) | RESPIRATORY_TRACT | Status: DC
  Administered 2020-04-02: 2.5 mg via RESPIRATORY_TRACT
  Filled 2020-04-02: qty 3

## 2020-04-02 MED ORDER — ALBUTEROL SULFATE (2.5 MG/3ML) 0.083% IN NEBU: 2.5000 mg | INHALATION_SOLUTION | Freq: Three times a day (TID) | RESPIRATORY_TRACT | Status: DC

## 2020-04-02 NOTE — Evaluation (Signed)
Physical Therapy Evaluation Patient Details Name: Paul [name] MRN: [id number] DOB: September 10, 1946 Today's Date: 04/02/2020   History of Present Illness  Pt is a 74 year old man admitted with SOB and syncopal episode. Pt with recent diarrhea. Found to have orthostatic hypotension and COPD exacerbation.  Pt has had 5 admissions in 6 months. PMH: COPD on 2L at home, afib, CHF, HLD, DM.    Clinical Impression  Pt very SOB with activity (25 feet) - O2 sats stay in 90s but resp rate in 40s.  Pt gets very fatiqued with his breathing pattern.  Pt knows a lot about his respiratory meds and routine. He travels with his pulse ox and flutter valve.  Pt very limited in his activity level at home - he currently is active with [facility].  I encouraged him to wear his compression hose at DC.  PT will follow pt while in the hospital and he plans to go home with wife's assist.    Follow Up Recommendations Home health PT(pt reports he is already active with [facility] agency)    Equipment Recommendations  None recommended by PT(pt said [facility] was looking into getting him equipment - ?WC)    Recommendations for Other Services       Precautions / Restrictions Precautions Precaution Comments: orthostatic when admitted.  Pt denies history of falls Restrictions Weight Bearing Restrictions: No      Mobility  Bed Mobility Overal bed mobility: Modified Independent             General bed mobility comments: HOB up  Transfers Overall transfer level: Modified independent                  Ambulation/Gait Ambulation/Gait assistance: Supervision Gait Distance (Feet): 25 Feet Assistive device: None Gait Pattern/deviations: Step-through pattern     General Gait Details: pt walked with good technque - no loss of balance.  Pt has 3 L O2.  Reminded to breath through nose.  pt easily SOB - and then takes short shallow breaths.  pt monitors his O2 sats wehn walking with his pulse ox.  Pt knows to try to  slow down his breathing.  His O2 sats stayed in mid 90s but his respiratory rate was in 40s.  very tiring to breath the way he does. He says he works closely with Dr Melvyn Novas on trying to breath better.  Stairs            Wheelchair Mobility    Modified Rankin (Stroke Patients Only)       Balance Overall balance assessment: Independent                                           Pertinent Vitals/Pain Pain Assessment: No/denies pain    Home Living Family/patient expects to be discharged to:: Private residence Living Arrangements: Spouse/significant other Available Help at Discharge: Family;Available 24 hours/day Type of Home: House Home Access: Level entry     Home Layout: One level Home Equipment: Shower seat;Other (comment) Additional Comments: likes to watch and play golf    Prior Function           Comments: ambulates independently, sits to shower     Hand Dominance        Extremity/Trunk Assessment        Lower Extremity Assessment Lower Extremity Assessment: Overall WFL for tasks assessed  Cervical / Trunk Assessment Cervical / Trunk Assessment: Normal  Communication   Communication: No difficulties(except SOB and unable to speak in full sentences)  Cognition Arousal/Alertness: Awake/alert Behavior During Therapy: WFL for tasks assessed/performed Overall Cognitive Status: Within Functional Limits for tasks assessed                                 General Comments: Pt able to verbalize his pulmonary routine - his meds, nebulizer. He came with his pulse ox and flutter valve -he travels with them      General Comments      Exercises     Assessment/Plan    PT Assessment Patient needs continued PT services  PT Problem List Decreased mobility;Decreased activity tolerance;Cardiopulmonary status limiting activity       PT Treatment Interventions Therapeutic activities;Gait training;Patient/family  education;Functional mobility training    PT Goals (Current goals can be found in the Care Plan section)  Acute Rehab PT Goals Patient Stated Goal: to go home PT Goal Formulation: With patient Time For Goal Achievement: 04/16/20 Potential to Achieve Goals: Fair    Frequency Min 3X/week   Barriers to discharge        Co-evaluation               AM-PAC PT "6 Clicks" Mobility  Outcome Measure Help needed turning from your back to your side while in a flat bed without using bedrails?: None Help needed moving from lying on your back to sitting on the side of a flat bed without using bedrails?: None Help needed moving to and from a bed to a chair (including a wheelchair)?: None Help needed standing up from a chair using your arms (e.g., wheelchair or bedside chair)?: A Little Help needed to walk in hospital room?: A Little Help needed climbing 3-5 steps with a railing? : A Little 6 Click Score: 21    End of Session Equipment Utilized During Treatment: Oxygen Activity Tolerance: Other (comment)(limited by increased respiratory rate) Patient left: in bed;with call bell/phone within reach Nurse Communication: Mobility status PT Visit Diagnosis: Muscle weakness (generalized) (M62.81);Difficulty in walking, not elsewhere classified (R26.2)    Time: [id number] PT Time Calculation (min) (ACUTE ONLY): 39 min   Charges:   PT Evaluation $PT Eval Low Complexity: 1 Low PT Treatments $Gait Training: 8-22 mins       04/02/2020   Rande Lawman, PT   Loyal Buba 04/02/2020, 5:34 PM

## 2020-04-02 NOTE — Progress Notes (Signed)
  Echocardiogram 2D Echocardiogram has been performed.  Paul [name] 04/02/2020, 2:58 PM

## 2020-04-02 NOTE — Progress Notes (Addendum)
PROGRESS NOTE    Paul [name]  QZR:[id number] DOB: [birth year] DOA: 04/01/2020 PCP: [name], MD    Brief Narrative: 74 year old gentleman prior history of chronic COPD oxygen dependent, paroxysmal atrial fibrillation on Eliquis, chronic diastolic heart failure, hyperlipidemia, diabetes mellitus, multiple recurrent hospital admissions for COPD exacerbation presents to ED initially on April 4 after an episode of syncope was found to be orthostatic and was discharged home.  He presents today with another syncopal episode, shortness of breath.  He was referred to [facility] for admission for acute COPD exacerbation recurrent syncope episodes and mild diarrhea probably secondary to azithromycin.  Assessment & Plan:   Principal Problem:   COPD with acute exacerbation ([facility]) Active Problems:   Paroxysmal atrial fibrillation (HCC)   HLD (hyperlipidemia)   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HCC)   Acute respiratory failure with hypoxia (HCC)   COPD exacerbation (HCC)   Syncope   Chronic diastolic CHF (congestive heart failure) (HCC)   Acute respiratory failure with hypoxia probably secondary to acute on chronic COPD exacerbation  Nasal cannula oxygen to keep sats greater than 90% currently is requiring up to 4 L of nasal cannula oxygen. Patient wheezing has improved on exam plan to transition to oral steroids Antibiotics discontinued due to diarrhea.  Continue with bronchodilators Roflumilast     Recurrent syncope Differentials include cardiogenic versus orthostatic/vagal induced. The static on admission were positive but repeat Orthostatic vital signs this morning within normal limits decrease the IV fluids as patient has history of mild chronic diastolic heart failure. Echocardiogram ordered.  Overnight telemetry does not show/reveal arrhthymia's/ heart blocks.  EEG will be ordered.  Carotid duplex ordered. TED hoses to be ordered    Paroxysmal atrial fibrillation Rate  control, continue with bisoprolol and Cardizem and Eliquis for anticoagulation. His CHA2DS2-VASc score greater than 5 Echocardiogram ordered.   History of chronic diastolic heart failure Patient appears to be compensated at this time. Chest x-ray does not show any vascular congestion or effusions. Continue with Lasix.   Hyperlipidemia Continue with Lipitor    Type 2 diabetes mellitus Continue with sliding scale insulin. CBG (last 3)  Recent Labs    04/02/20 0623 04/02/20 1139 04/02/20 1554  GLUCAP 157* 154* 129*       DVT prophylaxis: Eliquis Code Status: Partial Family Communication: Family at bedside   disposition Plan:  . Patient came from: Home            . Anticipated d/c place: Possibly home . Barriers to d/c OR conditions which need to be met to effect a safe d/c: Ongoing treatment for acute COPD exacerbation   Consultants:   None  Procedures: Echocardiogram Antimicrobials: Doxycycline discontinued  Subjective: Patient reports breathing is not back to baseline yet he is currently on 4 L of nasal cannula oxygen and he reports he is on 2 to 3.5 L at rest and and on activity  Objective: Vitals:   04/02/20 0814 04/02/20 0924 04/02/20 1345 04/02/20 1424  BP: (!) 117/56 (!) 123/56 (!) 99/44 (!) 100/56  Pulse: 94 95 84 81  Resp: 20 16 18 17   Temp: 98.5 F (36.9 C)  98.2 F (36.8 C)   TempSrc: Oral  Oral   SpO2: 98% 96% 97% 99%  Weight:      Height:        Intake/Output Summary (Last 24 hours) at 04/02/2020 1556 Last data filed at 04/02/2020 1245 Gross per 24 hour  Intake 2812.78 ml  Output 1050 ml  Net 1762.78 ml   Filed Weights   04/01/20 1257 04/02/20 0446  Weight: 63.5 kg 60.1 kg    Examination:  General exam: Appears calm and comfortable  Respiratory system: Scattered wheezing but air entry fair Cardiovascular system: S1 & S2 heard, RRR,  No JVD, No pedal edema. Gastrointestinal system: Abdomen is nondistended, soft and nontender. No  organomegaly or masses felt. Normal bowel sounds heard. Central nervous system: Alert and oriented. No focal neurological deficits. Extremities: no cyanosis or clubbing.  Skin: No rashes, Psychiatry: . Mood & affect appropriate.     Data Reviewed: I have personally reviewed following labs and imaging studies  CBC: Recent Labs  Lab 03/26/20 2310 03/27/20 0036 04/01/20 1328 04/02/20 0433  WBC 9.4  --  16.7* 8.8  NEUTROABS  --   --  15.5*  --   HGB 11.0* 11.2* 11.0* 9.0*  HCT 34.6* 33.0* 36.0* 29.1*  MCV 81.6  --  84.7 82.9  PLT 258  --  417* 606   Basic Metabolic Panel: Recent Labs  Lab 03/26/20 2310 03/27/20 0036 04/01/20 1328 04/02/20 0433  NA 138 136 141 143  K 3.2* 3.3* 4.1 3.6  CL 92*  --  97* 103  CO2 33*  --  32 32  GLUCOSE 163*  --  181* 177*  BUN 11  --  12 10  CREATININE 0.80  --  0.77 0.66  CALCIUM 8.3*  --  8.8* 8.0*   GFR: Estimated Creatinine Clearance: 68.9 mL/min (by C-G formula based on SCr of 0.66 mg/dL). Liver Function Tests: No results for input(s): AST, ALT, ALKPHOS, BILITOT, PROT, ALBUMIN in the last 168 hours. No results for input(s): LIPASE, AMYLASE in the last 168 hours. No results for input(s): AMMONIA in the last 168 hours. Coagulation Profile: Recent Labs  Lab 04/01/20 1328  INR 1.1   Cardiac Enzymes: No results for input(s): CKTOTAL, CKMB, CKMBINDEX, TROPONINI in the last 168 hours. BNP (last 3 results) No results for input(s): PROBNP in the last 8760 hours. HbA1C: No results for input(s): HGBA1C in the last 72 hours. CBG: Recent Labs  Lab 03/26/20 2324 04/01/20 1317 04/01/20 2113 04/02/20 0623 04/02/20 1139  GLUCAP 166* 182* 280* 157* 154*   Lipid Profile: No results for input(s): CHOL, HDL, LDLCALC, TRIG, CHOLHDL, LDLDIRECT in the last 72 hours. Thyroid Function Tests: No results for input(s): TSH, T4TOTAL, FREET4, T3FREE, THYROIDAB in the last 72 hours. Anemia Panel: No results for input(s): VITAMINB12, FOLATE,  FERRITIN, TIBC, IRON, RETICCTPCT in the last 72 hours. Sepsis Labs: No results for input(s): PROCALCITON, LATICACIDVEN in the last 168 hours.  Recent Results (from the past 240 hour(s))  Respiratory Panel by RT PCR (Flu A&B, Covid) - Nasopharyngeal Swab     Status: None   Collection Time: 04/01/20  2:37 PM   Specimen: Nasopharyngeal Swab  Result Value Ref Range Status   SARS Coronavirus 2 by RT PCR NEGATIVE NEGATIVE Final    Comment: (NOTE) SARS-CoV-2 target nucleic acids are NOT DETECTED. The SARS-CoV-2 RNA is generally detectable in upper respiratoy specimens during the acute phase of infection. The lowest concentration of SARS-CoV-2 viral copies this assay can detect is 131 copies/mL. A negative result does not preclude SARS-Cov-2 infection and should not be used as the sole basis for treatment or other patient management decisions. A negative result may occur with  improper specimen collection/handling, submission of specimen other than nasopharyngeal swab, presence of viral mutation(s) within the areas targeted by this assay, and inadequate number  of viral copies (<131 copies/mL). A negative result must be combined with clinical observations, patient history, and epidemiological information. The expected result is Negative. Fact Sheet for Patients:  PinkCheek.be Fact Sheet for Healthcare Providers:  GravelBags.it This test is not yet ap proved or cleared by the [place] FDA and  has been authorized for detection and/or diagnosis of SARS-CoV-2 by FDA under an Emergency Use Authorization (EUA). This EUA will remain  in effect (meaning this test can be used) for the duration of the COVID-19 declaration under Section 564(b)(1) of the Act, 21 U.S.C. section 360bbb-3(b)(1), unless the authorization is terminated or revoked sooner.    Influenza A by PCR NEGATIVE NEGATIVE Final   Influenza B by PCR NEGATIVE NEGATIVE Final     Comment: (NOTE) The Xpert Xpress SARS-CoV-2/FLU/RSV assay is intended as an aid in  the diagnosis of influenza from Nasopharyngeal swab specimens and  should not be used as a sole basis for treatment. Nasal washings and  aspirates are unacceptable for Xpert Xpress SARS-CoV-2/FLU/RSV  testing. Fact Sheet for Patients: PinkCheek.be Fact Sheet for Healthcare Providers: GravelBags.it This test is not yet approved or cleared by the [place] FDA and  has been authorized for detection and/or diagnosis of SARS-CoV-2 by  FDA under an Emergency Use Authorization (EUA). This EUA will remain  in effect (meaning this test can be used) for the duration of the  Covid-19 declaration under Section 564(b)(1) of the Act, 21  U.S.C. section 360bbb-3(b)(1), unless the authorization is  terminated or revoked. Performed at [facility], [place] [address] [place],  [address]          Radiology Studies: DG Chest Portable 1 View  Result Date: 04/01/2020 CLINICAL DATA:  Shortness of breath.  Syncope. EXAM: PORTABLE CHEST 1 VIEW COMPARISON:  March 27, 2020 FINDINGS: Hyperinflation of the lungs. The heart, hila, mediastinum, lungs, and pleura are otherwise stable and unremarkable. IMPRESSION: Hyperinflation of the lungs suggesting COPD or emphysema. No other abnormalities. Electronically Signed   By: Dorise Bullion III M.D   On: 04/01/2020 14:18   ECHOCARDIOGRAM COMPLETE  Result Date: 04/02/2020    ECHOCARDIOGRAM REPORT   Patient Name:   Paul [name] Date of Exam: 04/02/2020 Medical Rec #:  [id number]    Height:       68.0 in Accession #:    [id number]   Weight:       132.6 lb Date of Birth:  Apr 26, 1946     BSA:          1.716 m Patient Age:    22 years     BP:           100/56 mmHg Patient Gender: M            HR:           81 bpm. Exam Location:  Inpatient Procedure: 2D Echo, Cardiac Doppler and Color Doppler Indications:    Syncope   History:        Patient has prior history of Echocardiogram examinations, most                 recent 07/09/2019. CHF, COPD, Arrythmias:Atrial Fibrillation;                 Risk Factors:Dyslipidemia and Diabetes.  Sonographer:    Dustin Flock Referring Phys: [id number] [address]  1. Left ventricular ejection fraction, by estimation, is 60 to 65%. The left ventricle has normal function. The left ventricle has  no regional wall motion abnormalities. Left ventricular diastolic parameters were normal.  2. Right ventricular systolic function is normal. The right ventricular size is normal. There is normal pulmonary artery systolic pressure.  3. The mitral valve is normal in structure. Trivial mitral valve regurgitation. No evidence of mitral stenosis.  4. The aortic valve is tricuspid. Aortic valve regurgitation is not visualized. Mild aortic valve sclerosis is present, with no evidence of aortic valve stenosis.  5. The inferior vena cava is normal in size with greater than 50% respiratory variability, suggesting right atrial pressure of 3 mmHg. FINDINGS  Left Ventricle: Left ventricular ejection fraction, by estimation, is 60 to 65%. The left ventricle has normal function. The left ventricle has no regional wall motion abnormalities. The left ventricular internal cavity size was normal in size. There is  no left ventricular hypertrophy. Left ventricular diastolic parameters were normal. Normal left ventricular filling pressure. Right Ventricle: The right ventricular size is normal. No increase in right ventricular wall thickness. Right ventricular systolic function is normal. There is normal pulmonary artery systolic pressure. The tricuspid regurgitant velocity is 2.56 m/s, and  with an assumed right atrial pressure of 3 mmHg, the estimated right ventricular systolic pressure is 75.1 mmHg. Left Atrium: Left atrial size was normal in size. Right Atrium: Right atrial size was normal in size. Pericardium: There  is no evidence of pericardial effusion. Mitral Valve: The mitral valve is normal in structure. Normal mobility of the mitral valve leaflets. Trivial mitral valve regurgitation. No evidence of mitral valve stenosis. Tricuspid Valve: The tricuspid valve is normal in structure. Tricuspid valve regurgitation is mild . No evidence of tricuspid stenosis. Aortic Valve: The aortic valve is tricuspid. Aortic valve regurgitation is not visualized. Mild aortic valve sclerosis is present, with no evidence of aortic valve stenosis. Pulmonic Valve: The pulmonic valve was normal in structure. Pulmonic valve regurgitation is not visualized. No evidence of pulmonic stenosis. Aorta: The aortic root is normal in size and structure. Venous: The inferior vena cava is normal in size with greater than 50% respiratory variability, suggesting right atrial pressure of 3 mmHg. IAS/Shunts: The interatrial septum appears to be lipomatous. No atrial level shunt detected by color flow Doppler.  LEFT VENTRICLE PLAX 2D LVIDd:         4.40 cm  Diastology LVIDs:         2.58 cm  LV e' lateral:   10.30 cm/s LV PW:         0.99 cm  LV E/e' lateral: 8.8 LV IVS:        0.92 cm  LV e' medial:    8.92 cm/s LVOT diam:     1.80 cm  LV E/e' medial:  10.2 LV SV:         63 LV SV Index:   36 LVOT Area:     2.54 cm  RIGHT VENTRICLE RV Basal diam:  2.72 cm RV S prime:     11.30 cm/s TAPSE (M-mode): 2.5 cm LEFT ATRIUM             Index       RIGHT ATRIUM           Index LA diam:        2.90 cm 1.69 cm/m  RA Area:     14.10 cm LA Vol (A2C):   42.9 ml 25.00 ml/m RA Volume:   36.10 ml  21.03 ml/m LA Vol (A4C):   55.1 ml 32.11 ml/m LA Biplane Vol:  49.5 ml 28.84 ml/m  AORTIC VALVE LVOT Vmax:   103.00 cm/s LVOT Vmean:  73.600 cm/s LVOT VTI:    0.246 m  AORTA Ao Root diam: 2.80 cm MITRAL VALVE               TRICUSPID VALVE MV Area (PHT): 4.60 cm    TV Peak grad:   22.3 mmHg MV Decel Time: 165 msec    TV Vmax:        2.36 m/s MV E velocity: 90.80 cm/s  TR Peak  grad:   26.2 mmHg MV A velocity: 92.40 cm/s  TR Vmax:        256.00 cm/s MV E/A ratio:  0.98                            SHUNTS                            Systemic VTI:  0.25 m                            Systemic Diam: 1.80 cm Fransico Him MD Electronically signed by Fransico Him MD Signature Date/Time: 04/02/2020/3:51:03 PM    Final         Scheduled Meds: . AeroChamber Plus Flo-Vu Large  1 each Other Once  . albuterol  2.5 mg Nebulization BID  . apixaban  5 mg Oral BID  . atorvastatin  40 mg Oral q1800  . bisoprolol  5 mg Oral Daily  . diltiazem  360 mg Oral Daily  . escitalopram  5 mg Oral Daily  . furosemide  40 mg Oral Daily  . guaiFENesin  600 mg Oral BID  . insulin aspart  0-9 Units Subcutaneous TID WC  . loratadine  10 mg Oral Daily  . LORazepam  0.5 mg Oral BID  . melatonin  9 mg Oral QHS  . methylPREDNISolone (SOLU-MEDROL) injection  60 mg Intravenous Q6H   Followed by  . [START ON 04/03/2020] predniSONE  40 mg Oral Q breakfast  . mometasone-formoterol  2 puff Inhalation BID  . pantoprazole  40 mg Oral Daily  . roflumilast  250 mcg Oral Daily  . sodium chloride flush  3 mL Intravenous Q12H  . umeclidinium bromide  1 puff Inhalation Daily   Continuous Infusions: . sodium chloride 75 mL/hr at 04/02/20 1224     LOS: 1 day    Time spent: 29 minutes.     Hosie Poisson, MD [facility]   To contact the attending provider between 7A-7P or the covering provider during after hours 7P-7A, please log into the web site [email or web address] and access using universal [place] password for that web site. If you do not have the password, please call the hospital operator.  04/02/2020, 3:56 PM

## 2020-04-02 NOTE — Evaluation (Signed)
Occupational Therapy Evaluation and Discharge Patient Details Name: Paul [name] MRN: [id number] DOB: January 16, 1946 Today's Date: 04/02/2020    History of Present Illness Pt is a 74 year old man admitted with SOB and syncopal episode. Pt with recent diarrhea. Found to have orthostatic hypotension and COPD exacerbation.  Pt has had 5 admissions in 6 months. PMH: COPD on 2L at home, afib, CHF, HLD, DM.   Clinical Impression   Pt is functioning independently to modified independently in ADL. He is knowledgeable in energy conservation and breathing techniques. No further OT needs.    Follow Up Recommendations  No OT follow up    Equipment Recommendations  None recommended by OT    Recommendations for Other Services       Precautions / Restrictions Precautions Precaution Comments: orthostatic when admitted      Mobility Bed Mobility Overal bed mobility: Modified Independent             General bed mobility comments: HOB up  Transfers Overall transfer level: Independent                    Balance Overall balance assessment: No apparent balance deficits (not formally assessed)                                         ADL either performed or assessed with clinical judgement   ADL Overall ADL's : Modified independent                                       General ADL Comments: pt is aware of pursed lip breathing and energy conservation strategies. Instructed pt to wear 02 in shower on tank.     Vision Baseline Vision/History: Wears glasses Wears Glasses: Reading only       Perception     Praxis      Pertinent Vitals/Pain Pain Assessment: No/denies pain     Hand Dominance Right   Extremity/Trunk Assessment Upper Extremity Assessment Upper Extremity Assessment: Overall WFL for tasks assessed   Lower Extremity Assessment Lower Extremity Assessment: Overall WFL for tasks assessed   Cervical / Trunk Assessment Cervical  / Trunk Assessment: Normal   Communication Communication Communication: No difficulties   Cognition Arousal/Alertness: Awake/alert Behavior During Therapy: WFL for tasks assessed/performed Overall Cognitive Status: Within Functional Limits for tasks assessed                                     General Comments       Exercises     Shoulder Instructions      Home Living Family/patient expects to be discharged to:: Private residence Living Arrangements: Spouse/significant other Available Help at Discharge: Family;Available 24 hours/day Type of Home: House Home Access: Level entry     Home Layout: One level     Bathroom Shower/Tub: Occupational psychologist: Standard     Home Equipment: Shower seat;Other (comment)(02--2L)   Additional Comments: likes to watch and play golf      Prior Functioning/Environment Level of Independence: Independent with assistive device(s)        Comments: ambulates independently, sits to shower        OT Problem List:  OT Treatment/Interventions:      OT Goals(Current goals can be found in the care plan section) Acute Rehab OT Goals Patient Stated Goal: to go home  OT Frequency:     Barriers to D/C:            Co-evaluation              AM-PAC OT "6 Clicks" Daily Activity     Outcome Measure Help from another person eating meals?: None Help from another person taking care of personal grooming?: None Help from another person toileting, which includes using toliet, bedpan, or urinal?: None Help from another person bathing (including washing, rinsing, drying)?: None Help from another person to put on and taking off regular upper body clothing?: None Help from another person to put on and taking off regular lower body clothing?: None 6 Click Score: 24   End of Session Equipment Utilized During Treatment: Oxygen(3L)  Activity Tolerance: Patient tolerated treatment well Patient left: in  bed;with call bell/phone within reach  OT Visit Diagnosis: Other (comment)(decreased activity tolerance)                Time: [id number] OT Time Calculation (min): 19 min Charges:  OT General Charges $OT Visit: 1 Visit OT Evaluation $OT Eval Low Complexity: 1 Low  Paul [name], OTR/L Acute Rehabilitation Services Pager: [phone] Office: [phone]  Paul [name] 04/02/2020, 9:11 AM

## 2020-04-03 ENCOUNTER — Inpatient Hospital Stay (HOSPITAL_COMMUNITY): Payer: Medicare Other

## 2020-04-03 DIAGNOSIS — R55 Syncope and collapse: Secondary | ICD-10-CM

## 2020-04-03 DIAGNOSIS — E1169 Type 2 diabetes mellitus with other specified complication: Secondary | ICD-10-CM | POA: Diagnosis not present

## 2020-04-03 DIAGNOSIS — J441 Chronic obstructive pulmonary disease with (acute) exacerbation: Secondary | ICD-10-CM | POA: Diagnosis not present

## 2020-04-03 DIAGNOSIS — J9601 Acute respiratory failure with hypoxia: Secondary | ICD-10-CM | POA: Diagnosis not present

## 2020-04-03 DIAGNOSIS — I5032 Chronic diastolic (congestive) heart failure: Secondary | ICD-10-CM | POA: Diagnosis not present

## 2020-04-03 LAB — GLUCOSE, CAPILLARY
Glucose-Capillary: 136 mg/dL — ABNORMAL HIGH (ref 70–99)
Glucose-Capillary: 186 mg/dL — ABNORMAL HIGH (ref 70–99)
Glucose-Capillary: 208 mg/dL — ABNORMAL HIGH (ref 70–99)
Glucose-Capillary: 232 mg/dL — ABNORMAL HIGH (ref 70–99)

## 2020-04-03 LAB — BASIC METABOLIC PANEL
Anion gap: 10 (ref 5–15)
BUN: 13 mg/dL (ref 8–23)
CO2: 27 mmol/L (ref 22–32)
Calcium: 8 mg/dL — ABNORMAL LOW (ref 8.9–10.3)
Chloride: 104 mmol/L (ref 98–111)
Creatinine, Ser: 0.64 mg/dL (ref 0.61–1.24)
GFR calc Af Amer: >60 mL/min (ref >60)
GFR calc non Af Amer: >60 mL/min (ref >60)
Glucose, Bld: 176 mg/dL — ABNORMAL HIGH (ref 70–99)
Potassium: 3.9 mmol/L (ref 3.5–5.1)
Sodium: 141 mmol/L (ref 135–145)

## 2020-04-03 LAB — CBC
HCT: 32.5 % — ABNORMAL LOW (ref 39.0–52.0)
Hemoglobin: 10.1 g/dL — ABNORMAL LOW (ref 13.0–17.0)
MCH: 26.2 pg (ref 26.0–34.0)
MCHC: 31.1 g/dL (ref 30.0–36.0)
MCV: 84.2 fL (ref 80.0–100.0)
Platelets: 382 10*3/uL (ref 150–400)
RBC: 3.86 MIL/uL — ABNORMAL LOW (ref 4.22–5.81)
RDW: 17.5 % — ABNORMAL HIGH (ref 11.5–15.5)
WBC: 15.5 10*3/uL — ABNORMAL HIGH (ref 4.0–10.5)
nRBC: 0 % (ref 0.0–0.2)

## 2020-04-03 MED ORDER — INSULIN ASPART 100 UNIT/ML ~~LOC~~ SOLN
0.0000 [IU] | Freq: Every day | SUBCUTANEOUS | Status: DC
  Administered 2020-04-03: 2 [IU] via SUBCUTANEOUS

## 2020-04-03 MED ORDER — ADULT MULTIVITAMIN W/MINERALS CH
1.0000 | ORAL_TABLET | Freq: Every day | ORAL | Status: DC
  Administered 2020-04-04 – 2020-04-06 (×3): 1 via ORAL
  Filled 2020-04-03 (×4): qty 1

## 2020-04-03 MED ORDER — BOOST / RESOURCE BREEZE PO LIQD CUSTOM
1.0000 | Freq: Three times a day (TID) | ORAL | Status: DC
  Administered 2020-04-03 – 2020-04-06 (×8): 1 via ORAL

## 2020-04-03 MED ORDER — INSULIN ASPART 100 UNIT/ML ~~LOC~~ SOLN
0.0000 [IU] | Freq: Three times a day (TID) | SUBCUTANEOUS | Status: DC
  Administered 2020-04-04: 2 [IU] via SUBCUTANEOUS
  Administered 2020-04-05: 4 [IU] via SUBCUTANEOUS

## 2020-04-03 MED ORDER — INSULIN ASPART 100 UNIT/ML ~~LOC~~ SOLN
2.0000 [IU] | Freq: Three times a day (TID) | SUBCUTANEOUS | Status: DC
  Administered 2020-04-04 – 2020-04-05 (×6): 2 [IU] via SUBCUTANEOUS

## 2020-04-03 MED ORDER — ALBUTEROL SULFATE (2.5 MG/3ML) 0.083% IN NEBU
2.5000 mg | INHALATION_SOLUTION | Freq: Four times a day (QID) | RESPIRATORY_TRACT | Status: DC
  Administered 2020-04-03 – 2020-04-06 (×10): 2.5 mg via RESPIRATORY_TRACT
  Filled 2020-04-03 (×11): qty 3

## 2020-04-03 NOTE — Progress Notes (Signed)
  Mobility Specialist Criteria Algorithm Info. Mobility Team:   [facility] elevated:Self regulated Activity: Ambulated in room Range of motion: Active;All extremities Level of assistance: Standby assist, set-up cues, supervision of patient - no hands on Assistive device: None Minutes sitting in chair: Minutes stood: 5 minutes Minutes ambulated: 5 minutes Distance ambulated (ft): 35 ft Mobility response: Tolerated fair;RN notified (SOB 3/4 dyspneic. Did better with energy conservation/pursed breathing, needed one standing rest break. SPO2 91-93% throughout ambulation on 3LO2) Bed Position: Semi-fowlers   04/03/2020 12:37 PM

## 2020-04-03 NOTE — Progress Notes (Signed)
Portable EEG completed, results pending. 

## 2020-04-03 NOTE — Consult Note (Signed)
[facility]--Pt was enrolled in [facility] services on 03/21/20 for advanced COPD.  He lives in a townhouse with his wife-[name].  He requires supplemental O2 at 2 LPM [place] at home and generally wears it continuously with short breaks.  Will continue to follow hospital course.  Please call if [facility] can assist with any d/c planning needs.  Thank you Wynetta Fines, RN [phone]

## 2020-04-03 NOTE — Procedures (Signed)
Patient Name: Paul [name]  MRN: [id number]  Epilepsy Attending: Lora Havens  Referring Physician/Provider: Dr. Wendi Maya Date: 04/03/2020 Duration: 22.44 mins  Patient history: 74 year old male who presented with syncope.  EEG evaluate for seizures.  Level of alertness: Awake  AEDs during EEG study: None  Technical aspects: This EEG study was done with scalp electrodes positioned according to the 10-20 International system of electrode placement. Electrical activity was acquired at a sampling rate of 500Hz  and reviewed with a high frequency filter of 70Hz  and a low frequency filter of 1Hz . EEG data were recorded continuously and digitally stored.   Description: The posterior dominant rhythm consists of 7 Hz activity of moderate voltage (25-35 uV) seen predominantly in posterior head regions, symmetric and reactive to eye opening and eye closing.  Hyperventilation and photic stimulation were not performed.  Abnormality -Background slow  IMPRESSION: This study is suggestive of mild diffuse encephalopathy, nonspecific etiology. No seizures or epileptiform discharges were seen throughout the recording.  Paul [name] [name]

## 2020-04-03 NOTE — Progress Notes (Signed)
Initial Nutrition Assessment  DOCUMENTATION CODES:   Not applicable  INTERVENTION:   -Boost Breeze po TID, each supplement provides 250 kcal and 9 grams of protein -MVI with minerals daily  NUTRITION DIAGNOSIS:   Increased nutrient needs related to chronic illness(COPD) as evidenced by estimated needs.  GOAL:   Patient will meet greater than or equal to 90% of their needs  MONITOR:   PO intake, Supplement acceptance, Labs, Weight trends, Skin, I & O's  REASON FOR ASSESSMENT:   Consult Assessment of nutrition requirement/status  ASSESSMENT:   Paul [name] is a 74 y.o. male with medical history significant of COPD, paroxymal A Fib, chronic diastolic heart failure, HLD, DM.  Patient has had multiple COPD exacerbation hospitalization in the past several months, January 12-19, February 10-15, February 24-28, and again in March 13-18. He presented to ED on April 4 after syncopal episode, was given IVF with improvement in orthostatics and discharged from the ED. He now presents today due to another syncopal episode as well as shortness of breath. Per EDP, wife had reported syncopal episode with LOC. However, patient denies LOC today, states that he felt dizzy and bit nauseous and sat down. He reports SOB with productive cough of yellow/green sputum. He denies any fever, chest pain, decreased oral intake, vomiting, abdominal pain.  He has had diarrhea for the past 4 days while he was on azithromycin.  Pt admitted with COPD exacerbation.  Reviewed I/O's: +1.8L x 24 hours and +2.6 L since admission  UOP: 950 ml x 24 hours  Spoke with pt and family at bedside. Per pt wife, pt has had decreased appetite since last hospitalization approximately two months ago. Pt still consumes 3 meals per day, however, consumes very small portions. Typical intake would be a boiled egg and cereal at breakfast, sandwich at lunch, and a small portion of a meat, starch, and vegetable at dinner. Wife reports pt  was consuming one Ensure supplement daily starting about one month ago, however, this was topped a few days ago as pt was experiencing abdominal pain were advised to avoid dairy products.   Noted meal completion 75-100%, however, pt only consumed a half sandwich at lunch (pt wife admitted to eating the cake on his tray).   Pt shares that his weight fluctuates at baseline, however, typically regains his lost weight back after he is hospitalized. Family expressed concern that he has not regained his weight back from last hospitalization. Noted pt has experienced an 11% wt loss over the past 6 months, which is significant for time frame.    Discussed importance of good meal and supplement intake to promote healing. Pt is amenable to try [facility], as it is not milk based.  Medications reviewed and include lasix and prednisone.  Lab Results  Component Value Date   HGBA1C 7.3 (H) 03/04/2020   PTA DM medications are none. Per ADA's Standards of Medical Care for Diabetes, glycemic targets for elderly patients who are otherwise healthy (few medical impairments) and cognitively intact should be less stringent (Hgb A1c <7.5).   Labs reviewed: CBGS: 129-166 (inpatient orders for glycemic control are 0-9 units insulin aspart TID with meals).   NUTRITION - FOCUSED PHYSICAL EXAM:    Most Recent Value  Orbital Region  No depletion  Upper Arm Region  No depletion  Thoracic and Lumbar Region  No depletion  Buccal Region  No depletion  Temple Region  No depletion  Clavicle Bone Region  No depletion  Scapular Bone Region  No depletion  Dorsal Hand  No depletion  Patellar Region  No depletion  Anterior Thigh Region  No depletion  Posterior Calf Region  No depletion  Edema (RD Assessment)  None  Hair  Reviewed  Eyes  Reviewed  Mouth  Reviewed  Skin  Reviewed  Nails  Reviewed       Diet Order:   Diet Order            Diet regular Room service appropriate? Yes; Fluid consistency: Thin  Diet  effective now              EDUCATION NEEDS:   Education needs have been addressed  Skin:  Skin Assessment: Reviewed RN Assessment  Last BM:  04/01/20  Height:   Ht Readings from Last 1 Encounters:  04/01/20 5\' 8"  (1.727 m)    Weight:   Wt Readings from Last 1 Encounters:  04/03/20 61.5 kg    Ideal Body Weight:  70 kg  BMI:  Body mass index is 20.6 kg/m.  Estimated Nutritional Needs:   Kcal:  1950-2150  Protein:  90-105 grams  Fluid:  > 1.9 L    Loistine Chance, RD, LDN, [place] Registered Dietitian II Certified Diabetes Care and Education Specialist Please refer to [facility] for RD and/or RD on-call/weekend/after hours pager

## 2020-04-03 NOTE — Progress Notes (Signed)
Carotid duplex has been completed.   Preliminary results in CV Proc.   Abram Sander 04/03/2020 2:55 PM

## 2020-04-03 NOTE — Progress Notes (Signed)
Patient yesterday and stated that he had loose stools yesterday and asked about a abdominal scan. Plan to ask MD upon rounding

## 2020-04-03 NOTE — Progress Notes (Addendum)
PROGRESS NOTE    Paul [name]  YJE:[id number] DOB: 11-03-1946 DOA: 04/01/2020 PCP: [name], MD    Brief Narrative: 74 year old gentleman prior history of chronic COPD oxygen dependent, paroxysmal atrial fibrillation on Eliquis, chronic diastolic heart failure, hyperlipidemia, diabetes mellitus, multiple recurrent hospital admissions for COPD exacerbation presents to ED initially on April 4 after an episode of syncope was found to be orthostatic and was discharged home.  He presents today with another syncopal episode, shortness of breath.  He was referred to [facility] for admission for acute COPD exacerbation, recurrent syncopal episodes and mild diarrhea probably secondary to azithromycin. Patient reports he has some abdominal discomfort abdominal films negative for any obstruction.  Assessment & Plan:   Principal Problem:   COPD with acute exacerbation ([place]) Active Problems:   Paroxysmal atrial fibrillation (HCC)   HLD (hyperlipidemia)   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HCC)   Acute respiratory failure with hypoxia (HCC)   COPD exacerbation (HCC)   Syncope   Chronic diastolic CHF (congestive heart failure) (HCC)   Acute respiratory failure with hypoxia probably secondary to acute on chronic COPD exacerbation  Nasal cannula oxygen to keep sats greater than 90% currently is requiring up to five L of nasal cannula oxygen on ambulation. Patient's wheezing has improved continue with oral steroids. Antibiotics discontinued due to diarrhea.  Continue with bronchodilators , Roflumilast. He will need prolonged slow tapering of steroids over a period of 4 to 6 weeks.      Recurrent syncope Differentials include cardiogenic versus orthostatic/vagal induced. Orthostatics on admission were positive but repeat Orthostatic vital signs this morning within normal limits Continue IV fluids for another 12 hours and discontinue. Echocardiogram showed preserved LVEF, normal diastolic  function, .  Overnight telemetry does not show/reveal arrhthymia's/ heart blocks.  EEG is negative for seizures or epileptiform activity.   Carotid duplex ordered. TED hoses to be ordered    Paroxysmal atrial fibrillation Rate control, continue with bisoprolol and Cardizem and Eliquis for anticoagulation. His CHA2DS2-VASc score greater than 5 Echocardiogram reviewed.    History of chronic diastolic heart failure Patient appears to be compensated at this time. Chest x-ray does not show any vascular congestion or effusions. Continue with Lasix.   Hyperlipidemia Continue with Lipitor    Type 2 diabetes mellitus Continue with sliding scale insulin. CBG (last 3)  Recent Labs    04/03/20 0607 04/03/20 1254 04/03/20 1746  GLUCAP 136* 186* 208*   No changes in meds.  A1c is 7.3.   Anemia of chronic disease:  Baseline hemoglobin between 10 to 11, no signs of bleeding or malena.     DVT prophylaxis: Eliquis Code Status: Partial Family Communication: Family at bedside   disposition Plan:  . Patient came from: Home            . Anticipated d/c place: Possibly home . Barriers to d/c OR conditions which need to be met to effect a safe d/c: Ongoing treatment for acute COPD exacerbation   Consultants:   None  Procedures: Echocardiogram EEG Carotid duplex.  Antimicrobials: Doxycycline discontinued  Subjective: Patient reports persistent dizziness. Breathing has improved.   Objective: Vitals:   04/03/20 1808 04/03/20 1809 04/03/20 1811 04/03/20 1816  BP: 129/61 (!) 124/52 (!) 116/57 (!) 117/58  Pulse: 88 87 90 83  Resp: 20     Temp: 98 F (36.7 C)     TempSrc: Oral     SpO2: 100% 97% 97% 97%  Weight:  Height:        Intake/Output Summary (Last 24 hours) at 04/03/2020 1848 Last data filed at 04/03/2020 1553 Gross per 24 hour  Intake 1092.15 ml  Output 1200 ml  Net -107.85 ml   Filed Weights   04/01/20 1257 04/02/20 0446 04/03/20 0500  Weight: 63.5  kg 60.1 kg 61.5 kg    Examination:  General exam: Alert, comfortable on 5 L of nasal cannula oxygen Respiratory system: Air entry fair, scattered wheezing heard posteriorly, tachypnea present Cardiovascular system: S1-S2 heard, regular rate rhythm, no JVD, no pedal edema Gastrointestinal system: Abdomen is soft, nontender, nondistended bowel sounds within normal limits Central nervous system: Alert and oriented, grossly nonfocal Extremities: No cyanosis or clubbing Skin: No rashes, Psychiatry: Mood is appropriate    Data Reviewed: I have personally reviewed following labs and imaging studies  CBC: Recent Labs  Lab 04/01/20 1328 04/02/20 0433 04/03/20 0907  WBC 16.7* 8.8 15.5*  NEUTROABS 15.5*  --   --   HGB 11.0* 9.0* 10.1*  HCT 36.0* 29.1* 32.5*  MCV 84.7 82.9 84.2  PLT 417* 354 419   Basic Metabolic Panel: Recent Labs  Lab 04/01/20 1328 04/02/20 0433 04/03/20 0907  NA 141 143 141  K 4.1 3.6 3.9  CL 97* 103 104  CO2 32 32 27  GLUCOSE 181* 177* 176*  BUN 12 10 13   CREATININE 0.77 0.66 0.64  CALCIUM 8.8* 8.0* 8.0*   GFR: Estimated Creatinine Clearance: 70.5 mL/min (by C-G formula based on SCr of 0.64 mg/dL). Liver Function Tests: No results for input(s): AST, ALT, ALKPHOS, BILITOT, PROT, ALBUMIN in the last 168 hours. No results for input(s): LIPASE, AMYLASE in the last 168 hours. No results for input(s): AMMONIA in the last 168 hours. Coagulation Profile: Recent Labs  Lab 04/01/20 1328  INR 1.1   Cardiac Enzymes: No results for input(s): CKTOTAL, CKMB, CKMBINDEX, TROPONINI in the last 168 hours. BNP (last 3 results) No results for input(s): PROBNP in the last 8760 hours. HbA1C: No results for input(s): HGBA1C in the last 72 hours. CBG: Recent Labs  Lab 04/02/20 1554 04/02/20 2109 04/03/20 0607 04/03/20 1254 04/03/20 1746  GLUCAP 129* 166* 136* 186* 208*   Lipid Profile: No results for input(s): CHOL, HDL, LDLCALC, TRIG, CHOLHDL, LDLDIRECT in  the last 72 hours. Thyroid Function Tests: No results for input(s): TSH, T4TOTAL, FREET4, T3FREE, THYROIDAB in the last 72 hours. Anemia Panel: No results for input(s): VITAMINB12, FOLATE, FERRITIN, TIBC, IRON, RETICCTPCT in the last 72 hours. Sepsis Labs: No results for input(s): PROCALCITON, LATICACIDVEN in the last 168 hours.  Recent Results (from the past 240 hour(s))  Respiratory Panel by RT PCR (Flu A&B, Covid) - Nasopharyngeal Swab     Status: None   Collection Time: 04/01/20  2:37 PM   Specimen: Nasopharyngeal Swab  Result Value Ref Range Status   SARS Coronavirus 2 by RT PCR NEGATIVE NEGATIVE Final    Comment: (NOTE) SARS-CoV-2 target nucleic acids are NOT DETECTED. The SARS-CoV-2 RNA is generally detectable in upper respiratoy specimens during the acute phase of infection. The lowest concentration of SARS-CoV-2 viral copies this assay can detect is 131 copies/mL. A negative result does not preclude SARS-Cov-2 infection and should not be used as the sole basis for treatment or other patient management decisions. A negative result may occur with  improper specimen collection/handling, submission of specimen other than nasopharyngeal swab, presence of viral mutation(s) within the areas targeted by this assay, and inadequate number of viral copies (<131  copies/mL). A negative result must be combined with clinical observations, patient history, and epidemiological information. The expected result is Negative. Fact Sheet for Patients:  PinkCheek.be Fact Sheet for Healthcare Providers:  GravelBags.it This test is not yet ap proved or cleared by the [place] FDA and  has been authorized for detection and/or diagnosis of SARS-CoV-2 by FDA under an Emergency Use Authorization (EUA). This EUA will remain  in effect (meaning this test can be used) for the duration of the COVID-19 declaration under Section 564(b)(1) of the  Act, 21 U.S.C. section 360bbb-3(b)(1), unless the authorization is terminated or revoked sooner.    Influenza A by PCR NEGATIVE NEGATIVE Final   Influenza B by PCR NEGATIVE NEGATIVE Final    Comment: (NOTE) The Xpert Xpress SARS-CoV-2/FLU/RSV assay is intended as an aid in  the diagnosis of influenza from Nasopharyngeal swab specimens and  should not be used as a sole basis for treatment. Nasal washings and  aspirates are unacceptable for Xpert Xpress SARS-CoV-2/FLU/RSV  testing. Fact Sheet for Patients: PinkCheek.be Fact Sheet for Healthcare Providers: GravelBags.it This test is not yet approved or cleared by the [place] FDA and  has been authorized for detection and/or diagnosis of SARS-CoV-2 by  FDA under an Emergency Use Authorization (EUA). This EUA will remain  in effect (meaning this test can be used) for the duration of the  Covid-19 declaration under Section 564(b)(1) of the Act, 21  U.S.C. section 360bbb-3(b)(1), unless the authorization is  terminated or revoked. Performed at [facility], [place] [address], [place], [place] [address]          Radiology Studies: EEG adult  Result Date: 2020-04-27 Lora Havens, MD     04/27/20  3:11 PM Patient Name: Paul [name] MRN: [id number] Epilepsy Attending: Lora Havens Referring Physician/Provider: Dr. Wendi Maya Date: 2020/04/27 Duration: 22.44 mins Patient history: 74 year old male who presented with syncope.  EEG evaluate for seizures. Level of alertness: Awake AEDs during EEG study: None Technical aspects: This EEG study was done with scalp electrodes positioned according to the 10-20 International system of electrode placement. Electrical activity was acquired at a sampling rate of 500Hz  and reviewed with a high frequency filter of 70Hz  and a low frequency filter of 1Hz . EEG data were recorded continuously and digitally stored. Description: The  posterior dominant rhythm consists of 7 Hz activity of moderate voltage (25-35 uV) seen predominantly in posterior head regions, symmetric and reactive to eye opening and eye closing.  Hyperventilation and photic stimulation were not performed. Abnormality -Background slow IMPRESSION: This study is suggestive of mild diffuse encephalopathy, nonspecific etiology. No seizures or epileptiform discharges were seen throughout the recording. Lora Havens   ECHOCARDIOGRAM COMPLETE  Result Date: 04/02/2020    ECHOCARDIOGRAM REPORT   Patient Name:   Paul [name] Date of Exam: 04/02/2020 Medical Rec #:  [id number]    Height:       68.0 in Accession #:    [id number]   Weight:       132.6 lb Date of Birth:  [birth year]     BSA:          1.716 m Patient Age:    56 years     BP:           100/56 mmHg Patient Gender: M            HR:           81 bpm. Exam Location:  Inpatient Procedure: 2D  Echo, Cardiac Doppler and Color Doppler Indications:    Syncope  History:        Patient has prior history of Echocardiogram examinations, most                 recent 07/09/2019. CHF, COPD, Arrythmias:Atrial Fibrillation;                 Risk Factors:Dyslipidemia and Diabetes.  Sonographer:    Dustin Flock Referring Phys: [id number] Tice  1. Left ventricular ejection fraction, by estimation, is 60 to 65%. The left ventricle has normal function. The left ventricle has no regional wall motion abnormalities. Left ventricular diastolic parameters were normal.  2. Right ventricular systolic function is normal. The right ventricular size is normal. There is normal pulmonary artery systolic pressure.  3. The mitral valve is normal in structure. Trivial mitral valve regurgitation. No evidence of mitral stenosis.  4. The aortic valve is tricuspid. Aortic valve regurgitation is not visualized. Mild aortic valve sclerosis is present, with no evidence of aortic valve stenosis.  5. The inferior vena cava is normal in size with greater  than 50% respiratory variability, suggesting right atrial pressure of 3 mmHg. FINDINGS  Left Ventricle: Left ventricular ejection fraction, by estimation, is 60 to 65%. The left ventricle has normal function. The left ventricle has no regional wall motion abnormalities. The left ventricular internal cavity size was normal in size. There is  no left ventricular hypertrophy. Left ventricular diastolic parameters were normal. Normal left ventricular filling pressure. Right Ventricle: The right ventricular size is normal. No increase in right ventricular wall thickness. Right ventricular systolic function is normal. There is normal pulmonary artery systolic pressure. The tricuspid regurgitant velocity is 2.56 m/s, and  with an assumed right atrial pressure of 3 mmHg, the estimated right ventricular systolic pressure is 18.5 mmHg. Left Atrium: Left atrial size was normal in size. Right Atrium: Right atrial size was normal in size. Pericardium: There is no evidence of pericardial effusion. Mitral Valve: The mitral valve is normal in structure. Normal mobility of the mitral valve leaflets. Trivial mitral valve regurgitation. No evidence of mitral valve stenosis. Tricuspid Valve: The tricuspid valve is normal in structure. Tricuspid valve regurgitation is mild . No evidence of tricuspid stenosis. Aortic Valve: The aortic valve is tricuspid. Aortic valve regurgitation is not visualized. Mild aortic valve sclerosis is present, with no evidence of aortic valve stenosis. Pulmonic Valve: The pulmonic valve was normal in structure. Pulmonic valve regurgitation is not visualized. No evidence of pulmonic stenosis. Aorta: The aortic root is normal in size and structure. Venous: The inferior vena cava is normal in size with greater than 50% respiratory variability, suggesting right atrial pressure of 3 mmHg. IAS/Shunts: The interatrial septum appears to be lipomatous. No atrial level shunt detected by color flow Doppler.  LEFT  VENTRICLE PLAX 2D LVIDd:         4.40 cm  Diastology LVIDs:         2.58 cm  LV e' lateral:   10.30 cm/s LV PW:         0.99 cm  LV E/e' lateral: 8.8 LV IVS:        0.92 cm  LV e' medial:    8.92 cm/s LVOT diam:     1.80 cm  LV E/e' medial:  10.2 LV SV:         63 LV SV Index:   36 LVOT Area:     2.54 cm  RIGHT  VENTRICLE RV Basal diam:  2.72 cm RV S prime:     11.30 cm/s TAPSE (M-mode): 2.5 cm LEFT ATRIUM             Index       RIGHT ATRIUM           Index LA diam:        2.90 cm 1.69 cm/m  RA Area:     14.10 cm LA Vol (A2C):   42.9 ml 25.00 ml/m RA Volume:   36.10 ml  21.03 ml/m LA Vol (A4C):   55.1 ml 32.11 ml/m LA Biplane Vol: 49.5 ml 28.84 ml/m  AORTIC VALVE LVOT Vmax:   103.00 cm/s LVOT Vmean:  73.600 cm/s LVOT VTI:    0.246 m  AORTA Ao Root diam: 2.80 cm MITRAL VALVE               TRICUSPID VALVE MV Area (PHT): 4.60 cm    TV Peak grad:   22.3 mmHg MV Decel Time: 165 msec    TV Vmax:        2.36 m/s MV E velocity: 90.80 cm/s  TR Peak grad:   26.2 mmHg MV A velocity: 92.40 cm/s  TR Vmax:        256.00 cm/s MV E/A ratio:  0.98                            SHUNTS                            Systemic VTI:  0.25 m                            Systemic Diam: 1.80 cm Fransico Him MD Electronically signed by Fransico Him MD Signature Date/Time: 04/02/2020/3:51:03 PM    Final    VAS US CAROTID  Result Date: 04/03/2020 Carotid Arterial Duplex Study Indications:       Syncope. Risk Factors:      Hypertension, hyperlipidemia. Comparison Study:  no prior Performing Technologist: Abram Sander RVS  Examination Guidelines: A complete evaluation includes B-mode imaging, spectral Doppler, color Doppler, and power Doppler as needed of all accessible portions of each vessel. Bilateral testing is considered an integral part of a complete examination. Limited examinations for reoccurring indications may be performed as noted.  Right Carotid Findings: +----------+--------+--------+--------+------------------+--------+            PSV cm/sEDV cm/sStenosisPlaque DescriptionComments +----------+--------+--------+--------+------------------+--------+ CCA Prox  72      9               heterogenous               +----------+--------+--------+--------+------------------+--------+ CCA Distal80      10              heterogenous               +----------+--------+--------+--------+------------------+--------+ ICA Prox  97      21      1-39%   heterogenous               +----------+--------+--------+--------+------------------+--------+ ICA Distal100     23                                         +----------+--------+--------+--------+------------------+--------+  ECA       318                                                +----------+--------+--------+--------+------------------+--------+ +----------+--------+-------+--------+-------------------+           PSV cm/sEDV cmsDescribeArm Pressure (mmHG) +----------+--------+-------+--------+-------------------+ [id number]                                        +----------+--------+-------+--------+-------------------+ +---------+--------+--+--------+--+---------+ VertebralPSV cm/s60EDV cm/s14Antegrade +---------+--------+--+--------+--+---------+  Left Carotid Findings: +----------+--------+--------+--------+------------------+--------+           PSV cm/sEDV cm/sStenosisPlaque DescriptionComments +----------+--------+--------+--------+------------------+--------+ CCA Prox  98      19              heterogenous               +----------+--------+--------+--------+------------------+--------+ CCA Distal79      15              heterogenous               +----------+--------+--------+--------+------------------+--------+ ICA Prox  120     32      1-39%   heterogenous               +----------+--------+--------+--------+------------------+--------+ ICA Distal72      23                                          +----------+--------+--------+--------+------------------+--------+ ECA       277     10                                         +----------+--------+--------+--------+------------------+--------+ +----------+--------+--------+--------+-------------------+           PSV cm/sEDV cm/sDescribeArm Pressure (mmHG) +----------+--------+--------+--------+-------------------+ [id number]                                         +----------+--------+--------+--------+-------------------+ +---------+--------+--+--------+--+---------+ VertebralPSV cm/s95EDV cm/s22Antegrade +---------+--------+--+--------+--+---------+   Summary: Right Carotid: Velocities in the right ICA are consistent with a 1-39% stenosis. Left Carotid: Velocities in the left ICA are consistent with a 1-39% stenosis. Vertebrals: Bilateral vertebral arteries demonstrate antegrade flow. *See table(s) above for measurements and observations.     Preliminary         Scheduled Meds: . AeroChamber Plus Flo-Vu Large  1 each Other Once  . albuterol  2.5 mg Nebulization Q6H WA  . apixaban  5 mg Oral BID  . atorvastatin  40 mg Oral q1800  . bisoprolol  5 mg Oral Daily  . diltiazem  360 mg Oral Daily  . escitalopram  5 mg Oral Daily  . feeding supplement  1 Container Oral TID BM  . furosemide  40 mg Oral Daily  . guaiFENesin  600 mg Oral BID  . [START ON 04/04/2020] insulin aspart  0-20 Units Subcutaneous TID WC  . insulin aspart  0-5 Units Subcutaneous QHS  . insulin aspart  0-9 Units Subcutaneous TID WC  . [START ON 04/04/2020] insulin aspart  2  Units Subcutaneous TID WC  . loratadine  10 mg Oral Daily  . LORazepam  0.5 mg Oral BID  . melatonin  9 mg Oral QHS  . mometasone-formoterol  2 puff Inhalation BID  . multivitamin with minerals  1 tablet Oral Daily  . pantoprazole  40 mg Oral Daily  . predniSONE  40 mg Oral Q breakfast  . roflumilast  250 mcg Oral Daily  . sodium chloride flush  3 mL Intravenous Q12H  .  umeclidinium bromide  1 puff Inhalation Daily   Continuous Infusions: . sodium chloride 75 mL/hr at 04/03/20 1757     LOS: 2 days    Time spent: 29 minutes.     Hosie Poisson, MD [facility]   To contact the attending provider between 7A-7P or the covering provider during after hours 7P-7A, please log into the web site [email or web address] and access using universal [place] password for that web site. If you do not have the password, please call the hospital operator.  04/03/2020, 6:48 PM

## 2020-04-04 ENCOUNTER — Telehealth: Payer: Self-pay | Admitting: Internal Medicine

## 2020-04-04 DIAGNOSIS — J9601 Acute respiratory failure with hypoxia: Secondary | ICD-10-CM | POA: Diagnosis not present

## 2020-04-04 DIAGNOSIS — J441 Chronic obstructive pulmonary disease with (acute) exacerbation: Secondary | ICD-10-CM | POA: Diagnosis not present

## 2020-04-04 DIAGNOSIS — E1169 Type 2 diabetes mellitus with other specified complication: Secondary | ICD-10-CM | POA: Diagnosis not present

## 2020-04-04 DIAGNOSIS — I5032 Chronic diastolic (congestive) heart failure: Secondary | ICD-10-CM | POA: Diagnosis not present

## 2020-04-04 LAB — GLUCOSE, CAPILLARY
Glucose-Capillary: 95 mg/dL (ref 70–99)
Glucose-Capillary: 88 mg/dL (ref 70–99)
Glucose-Capillary: 250 mg/dL — ABNORMAL HIGH (ref 70–99)
Glucose-Capillary: 123 mg/dL — ABNORMAL HIGH (ref 70–99)

## 2020-04-04 MED ORDER — LIVING WELL WITH DIABETES BOOK
Freq: Once | Status: AC
  Filled 2020-04-04: qty 1

## 2020-04-04 MED ORDER — SENNOSIDES-DOCUSATE SODIUM 8.6-50 MG PO TABS
2.0000 | ORAL_TABLET | Freq: Two times a day (BID) | ORAL | Status: DC
  Administered 2020-04-04 – 2020-04-06 (×3): 2 via ORAL
  Filled 2020-04-04 (×3): qty 2

## 2020-04-04 NOTE — Telephone Encounter (Signed)
Left message for [name] to call back. I looked at the patient's chart and it looks like he is currently in the hospital. It might be a better plan to have the hospital to order the walker.

## 2020-04-04 NOTE — Progress Notes (Addendum)
Inpatient Diabetes Program Recommendations  AACE/ADA: New Consensus Statement on Inpatient Glycemic Control (2015)  Target Ranges:  Prepandial:   less than 140 mg/dL      Peak postprandial:   less than 180 mg/dL (1-2 hours)      Critically ill patients:  140 - 180 mg/dL   Lab Results  Component Value Date   GLUCAP 95 04/04/2020   HGBA1C 7.3 (H) 03/04/2020    Review of Glycemic Control Results for Paul [name], Paul [name] (MRN [id number]) as of 04/04/2020 11:07  Ref. Range 03/04/2020 16:54  Hemoglobin A1C Latest Ref Range: 4.8 - 5.6 % 7.3 (H)   Results for Paul [name], Paul [name] (MRN [id number]) as of 04/04/2020 11:07  Ref. Range 04/02/2020 15:54 04/02/2020 21:09 04/03/2020 06:07 04/03/2020 12:54 04/03/2020 17:46 04/03/2020 21:52 04/04/2020 06:22  Glucose-Capillary Latest Ref Range: 70 - 99 mg/dL 129 (H) 166 (H) 136 (H) 186 (H) 208 (H) 232 (H) 95    Diabetes history: DM2 Outpatient Diabetes medications: None Current orders for Inpatient glycemic control: Novolog 2 units TID with meals + 0-20 units TID + 0-5 units QHS + prednisone 40 mg QD  Note: Spoke with patient and wife at bedside.  Reviewed patient's current A1c of 7.3% (average blood sugar of 163 mg/dl). Explained what a A1c is and what it measures. Also reviewed goal A1c with patient, importance of good glucose control @ home, and blood sugar goals.  He does not drink any drinks with sugar and does not watch his CHO intake.  Educated patient on The Plate Method and limiting CHO's.  Explained which foods contain higher CHO's.  He does not check his blood sugar at home and is not on any oral medications for DM.  He has been on prednisone for over 1 year which is likely contributing to his A1C.  He came from home with diarrhea and hypotension.  Would not recommend Metformin if considering oral agents.  Hopefully with diet modification he can lower his A1C.  Ordered LWWD booklet and attached education to AVs.  Will continue to follow while inpatient.   Thank  you, Reche Dixon, RN, BSN Diabetes Coordinator Inpatient Diabetes Program [phone] (team pager from 8a-5p)

## 2020-04-04 NOTE — Progress Notes (Signed)
Physical Therapy Treatment Patient Details Name: Paul [name] MRN: [id number] DOB: 10/24/1946 Today's Date: 04/04/2020    History of Present Illness Pt is a 74 year old man admitted with SOB and syncopal episode. Pt with recent diarrhea. Found to have orthostatic hypotension and COPD exacerbation.  Pt has had 5 admissions in 6 months. PMH: COPD on 2L at home, afib, CHF, HLD, DM.    PT Comments    Pt admitted with above diagnosis. Assess vestibular system and pt negative for BPPV as well as all other vestibular issues.  His orthostasis is resolved aas well.  Pt was able to ambulate but needed 6L O2 and needs frequent rest breaks. Recommend rollator to family and they say they have access to one.  Pt progressing.  Pt currently with functional limitations due to balance and endurance deficits. Pt will benefit from skilled PT to increase their independence and safety with mobility to allow discharge to the venue listed below.     Follow Up Recommendations  Home health PT(pt reports he is already active with [facility] agency)     Equipment Recommendations  None recommended by PT(pt said [facility] was looking into getting him equipment - ?WC), wife states they have access to a rollator and will get one.    Recommendations for Other Services       Precautions / Restrictions Precautions Precaution Comments: orthostatic when admitted.  Pt denies history of falls Restrictions Weight Bearing Restrictions: No    Mobility  Bed Mobility Overal bed mobility: Modified Independent             General bed mobility comments: HOB up  Transfers Overall transfer level: Modified independent                  Ambulation/Gait Ambulation/Gait assistance: Supervision Gait Distance (Feet): 150 Feet Assistive device: None Gait Pattern/deviations: Step-through pattern;Decreased stride length;Drifts right/left   Gait velocity interpretation: <1.31 ft/sec, indicative of household ambulator General  Gait Details: pt walked with good technque - no loss of balance.  Pt has 3 L O2 at rest.  Needed 6LO2 during activity as he desat to 84% on 3L.  Reminded to breath through nose.  pt easily SOB - and then takes short shallow breaths.  Pt knows to try to slow down his breathing.  Talked with pt and wife about getting a rollator so pt can sit when he needs to. they state they are interested and can borrow one from a friend.    Stairs             Wheelchair Mobility    Modified Rankin (Stroke Patients Only)       Balance Overall balance assessment: Independent                                          Cognition Arousal/Alertness: Awake/alert Behavior During Therapy: WFL for tasks assessed/performed Overall Cognitive Status: Within Functional Limits for tasks assessed                                 General Comments: Pt able to verbalize his pulmonary routine - his meds, nebulizer. He came with his pulse ox and flutter valve -he travels with them      Exercises      General Comments General comments (skin integrity, edema, etc.):  Assessed vestibular system but found no evidence of BPPV or any other vestibular issues.        Pertinent Vitals/Pain Pain Assessment: No/denies pain    Home Living                      Prior Function            PT Goals (current goals can now be found in the care plan section) Acute Rehab PT Goals Patient Stated Goal: to go home Progress towards PT goals: Progressing toward goals    Frequency    Min 3X/week      PT Plan Current plan remains appropriate    Co-evaluation              AM-PAC PT "6 Clicks" Mobility   Outcome Measure  Help needed turning from your back to your side while in a flat bed without using bedrails?: None Help needed moving from lying on your back to sitting on the side of a flat bed without using bedrails?: None Help needed moving to and from a bed to a chair  (including a wheelchair)?: None Help needed standing up from a chair using your arms (e.g., wheelchair or bedside chair)?: A Little Help needed to walk in hospital room?: A Little Help needed climbing 3-5 steps with a railing? : A Little 6 Click Score: 21    End of Session Equipment Utilized During Treatment: Oxygen;Gait belt Activity Tolerance: Patient limited by fatigue Patient left: with call bell/phone within reach;in chair;with chair alarm set;with family/visitor present Nurse Communication: Mobility status PT Visit Diagnosis: Muscle weakness (generalized) (M62.81);Difficulty in walking, not elsewhere classified (R26.2)     Time: [id number] PT Time Calculation (min) (ACUTE ONLY): 24 min  Charges:  $Gait Training: 8-22 mins $Therapeutic Activity: 8-22 mins                     Lannie Heaps W,PT Acute Rehabilitation Services Pager:  [phone]  Office:  [place] 04/04/2020, 1:33 PM

## 2020-04-04 NOTE — Progress Notes (Signed)
PROGRESS NOTE    Paul [name]  EKC:[id number] DOB: [birth year] DOA: 04/01/2020 PCP: [name], MD    Brief Narrative: 74 year old gentleman prior history of chronic COPD oxygen dependent, paroxysmal atrial fibrillation on Eliquis, chronic diastolic heart failure, hyperlipidemia, diabetes mellitus, multiple recurrent hospital admissions for COPD exacerbation presents to ED initially on April 4 after an episode of syncope was found to be orthostatic and was discharged home.  He presents this admission with another syncopal episode, and worsening shortness of breath.  He was referred to [facility] for admission for acute COPD exacerbation, recurrent syncope episodes. Extensive work up revealed orthostatic hypotension which was resolved with IV fluids. Therapy evaluations recommending Home health PT. Plan for D/C in am as per family request.    Assessment & Plan:   Principal Problem:   COPD with acute exacerbation ([place]) Active Problems:   Paroxysmal atrial fibrillation (HCC)   HLD (hyperlipidemia)   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HCC)   Acute respiratory failure with hypoxia (HCC)   COPD exacerbation (HCC)   Syncope   Chronic diastolic CHF (congestive heart failure) (HCC)   Acute respiratory failure with hypoxia probably secondary to acute on chronic COPD exacerbation  Pt had upto 5 admissions in the last 6 months for recurrent COPD exacerbations.  Pt reports at rest he is on 2 lit of [place] oxygen, and on 2.5 lit on ambulation. Today he required upto 6 lit of [place] oxygen on ambulation. Recommend check ing ambulating oxygen levels in am prior to discharge in am.  Patient's wheezing has improved though he is currently on 6 lit of  oxygen.  Antibiotics discontinued due to diarrhea.  Continue with bronchodilators as needed and  Roflumilast. Recommend slow tapering of steroids over the next 4 to 6 weeks. Recommend outpatient follow up with Dr Melvyn Novas, scheduled on Thursday.    Recurrent  syncope along with dizziness Differentials include cardiogenic versus orthostatic/vagal induced. Suspect a component of hypoxia too.   Orthostatics on admission were positive but repeat Orthostatic vital signs the last two days within normal limits. Echocardiogram ordered and within normal limits.  Overnight telemetry does not show/reveal arrhthymia's/ heart blocks.  EEG negative for seizures or epileptiform discharges on the recording.  Carotid duplex did not show any significant stenosis.. TED hoses to be ordered. PT vestibular evaluation today for evaluation of BPPV ordered.  If patient has persistent or recurrent syncope recommend cardiology follow-up for event monitor placement    Paroxysmal atrial fibrillation Rate control, continue with bisoprolol and Cardizem and Eliquis for anticoagulation. His CHA2DS2-VASc score greater than 5 Echocardiogram showed preserved left ventricular ejection fraction, normal diastolic function   History of chronic diastolic heart failure Patient appears to be compensated at this time. Chest x-ray does not show any vascular congestion or effusions. Continue with Lasix.   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 CBG (last 3)  Recent Labs    04/03/20 1746 04/03/20 2152 04/04/20 0622  GLUCAP 208* 232* 95   Hemoglobin A1c at 7.3   Anemia of chronic disease Hemoglobin stable around 10.   DVT prophylaxis: Eliquis Code Status: Partial Family Communication: Family at bedside   disposition Plan:  . Patient came from: Home            . Anticipated d/c place: Possibly home tomorrow Barriers to d/c OR conditions which need to be met to effect a safe d/c: high likely  for readmission , hence re check ambulating oxygen levels.   Consultants:   None  Procedures: Echocardiogram is 60 to 65%. The  left ventricle has normal function.  The left ventricle has no regional  wall motion abnormalities. Left ventricular diastolic parameters were normal.    Carotid duplex shows Velocities in the right ICA and left ICA are consistent with a 1-39%  stenosis. Bilateral vertebral arteries demonstrate antegrade flow.   EEG suggestive of mild diffuse encephalopathy, nonspecific etiology. No seizures or epileptiform discharges were seen throughout the recording.  Antimicrobials: Doxycycline discontinued  Subjective: Patient reports his dizziness has improved. He denies any chest pain, nausea vomiting or abdominal pain or diarrhea today. He reports being short of breath and tachypneic on ambulation with PT today  Objective: Vitals:   04/03/20 2127 04/04/20 0620 04/04/20 0627 04/04/20 0845  BP: 121/61 118/73    Pulse: 80 85  78  Resp: 20 18  20   Temp: 98.1 F (36.7 C) 98 F (36.7 C)    TempSrc: Oral Oral    SpO2: 99% 100%  98%  Weight:   62.6 kg   Height:        Intake/Output Summary (Last 24 hours) at 04/04/2020 0857 Last data filed at 04/04/2020 0815 Gross per 24 hour  Intake 360 ml  Output 1175 ml  Net -815 ml   Filed Weights   04/02/20 0446 04/03/20 0500 04/04/20 0627  Weight: 60.1 kg 61.5 kg 62.6 kg    Examination:  General exam: Alert and comfortable Respiratory system: No wheezing heard but tachypneic air entry fair no rhonchi heard. Cardiovascular system: S1-S2 heard, regular rate rhythm, no JVD, no pedal edema Gastrointestinal system: Abdomen is soft, nontender, nondistended bowel sounds normal Central nervous system: Alert and oriented, grossly nonfocal Extremities: No cyanosis or clubbing Skin: No rashes seen Psychiatry: . Mood is appropriate   Data Reviewed: I have personally reviewed following labs and imaging studies  CBC: Recent Labs  Lab 04/01/20 1328 04/02/20 0433 04/03/20 0907  WBC 16.7* 8.8 15.5*  NEUTROABS 15.5*  --   --   HGB 11.0* 9.0* 10.1*  HCT 36.0* 29.1* 32.5*  MCV 84.7 82.9  84.2  PLT 417* 354 700   Basic Metabolic Panel: Recent Labs  Lab 04/01/20 1328 04/02/20 0433 04/03/20 0907  NA 141 143 141  K 4.1 3.6 3.9  CL 97* 103 104  CO2 32 32 27  GLUCOSE 181* 177* 176*  BUN 12 10 13   CREATININE 0.77 0.66 0.64  CALCIUM 8.8* 8.0* 8.0*   GFR: Estimated Creatinine Clearance: 71.7 mL/min (by C-G formula based on SCr of 0.64 mg/dL). Liver Function Tests: No results for input(s): AST, ALT, ALKPHOS, BILITOT, PROT, ALBUMIN in the last 168 hours. No results for input(s): LIPASE, AMYLASE in the last 168 hours. No results for input(s): AMMONIA in the last 168 hours. Coagulation Profile: Recent Labs  Lab 04/01/20 1328  INR 1.1   Cardiac Enzymes: No results for input(s): CKTOTAL, CKMB, CKMBINDEX, TROPONINI in the last 168 hours. BNP (last 3 results) No results for input(s): PROBNP in the last 8760 hours. HbA1C: No results for input(s): HGBA1C in the last 72 hours. CBG: Recent Labs  Lab 04/03/20 0607 04/03/20 1254 04/03/20 1746 04/03/20 2152 04/04/20 0622  GLUCAP 136* 186* 208* 232* 95   Lipid Profile: No results for input(s): CHOL, HDL, LDLCALC, TRIG, CHOLHDL, LDLDIRECT in the last 72 hours. Thyroid Function Tests: No results for input(s): TSH, T4TOTAL, FREET4, T3FREE, THYROIDAB in the last 72  hours. Anemia Panel: No results for input(s): VITAMINB12, FOLATE, FERRITIN, TIBC, IRON, RETICCTPCT in the last 72 hours. Sepsis Labs: No results for input(s): PROCALCITON, LATICACIDVEN in the last 168 hours.  Recent Results (from the past 240 hour(s))  Respiratory Panel by RT PCR (Flu A&B, Covid) - Nasopharyngeal Swab     Status: None   Collection Time: 04/01/20  2:37 PM   Specimen: Nasopharyngeal Swab  Result Value Ref Range Status   SARS Coronavirus 2 by RT PCR NEGATIVE NEGATIVE Final    Comment: (NOTE) SARS-CoV-2 target nucleic acids are NOT DETECTED. The SARS-CoV-2 RNA is generally detectable in upper respiratoy specimens during the acute phase of  infection. The lowest concentration of SARS-CoV-2 viral copies this assay can detect is 131 copies/mL. A negative result does not preclude SARS-Cov-2 infection and should not be used as the sole basis for treatment or other patient management decisions. A negative result may occur with  improper specimen collection/handling, submission of specimen other than nasopharyngeal swab, presence of viral mutation(s) within the areas targeted by this assay, and inadequate number of viral copies (<131 copies/mL). A negative result must be combined with clinical observations, patient history, and epidemiological information. The expected result is Negative. Fact Sheet for Patients:  PinkCheek.be Fact Sheet for Healthcare Providers:  GravelBags.it This test is not yet ap proved or cleared by the [place] FDA and  has been authorized for detection and/or diagnosis of SARS-CoV-2 by FDA under an Emergency Use Authorization (EUA). This EUA will remain  in effect (meaning this test can be used) for the duration of the COVID-19 declaration under Section 564(b)(1) of the Act, 21 U.S.C. section 360bbb-3(b)(1), unless the authorization is terminated or revoked sooner.    Influenza A by PCR NEGATIVE NEGATIVE Final   Influenza B by PCR NEGATIVE NEGATIVE Final    Comment: (NOTE) The Xpert Xpress SARS-CoV-2/FLU/RSV assay is intended as an aid in  the diagnosis of influenza from Nasopharyngeal swab specimens and  should not be used as a sole basis for treatment. Nasal washings and  aspirates are unacceptable for Xpert Xpress SARS-CoV-2/FLU/RSV  testing. Fact Sheet for Patients: PinkCheek.be Fact Sheet for Healthcare Providers: GravelBags.it This test is not yet approved or cleared by the [place] FDA and  has been authorized for detection and/or diagnosis of SARS-CoV-2 by  FDA under  an Emergency Use Authorization (EUA). This EUA will remain  in effect (meaning this test can be used) for the duration of the  Covid-19 declaration under Section 564(b)(1) of the Act, 21  U.S.C. section 360bbb-3(b)(1), unless the authorization is  terminated or revoked. Performed at [facility], [place] [address] [place], [place] [address]          Radiology Studies: MR BRAIN WO CONTRAST  Result Date: 04/03/2020 CLINICAL DATA:  Ataxia. Atrial fibrillation on Eliquis. EXAM: MRI HEAD WITHOUT CONTRAST TECHNIQUE: Multiplanar, multiecho pulse sequences of the brain and surrounding structures were obtained without intravenous contrast. COMPARISON:  Head CT 03/27/2020 FINDINGS: Brain: No acute infarct, acute hemorrhage or extra-axial collection. Multifocal white matter hyperintensity, most commonly due to chronic ischemic microangiopathy. There is generalized atrophy without lobar predilection. No chronic microhemorrhage. Normal midline structures. Vascular: Normal flow voids. Skull and upper cervical spine: Normal marrow signal. Sinuses/Orbits: Negative. Other: None IMPRESSION: 1. No acute intracranial abnormality. 2. Generalized atrophy and findings of chronic ischemic microangiopathy. Electronically Signed   By: Ulyses Jarred M.D.   On: 04/03/2020 19:05   DG Abd 2 Views  Result  Date: 04/03/2020 CLINICAL DATA:  Abdominal discomfort EXAM: ABDOMEN - 2 VIEW COMPARISON:  None. FINDINGS: Two supine frontal views of the abdomen and pelvis demonstrate an unremarkable bowel gas pattern. There are no masses or abnormal calcifications. Lung bases are clear. IMPRESSION: 1. Unremarkable bowel gas pattern. Electronically Signed   By: Randa Ngo M.D.   On: 04/03/2020 19:03   EEG adult  Result Date: 04/03/2020 Lora Havens, MD     04/03/2020  3:11 PM Patient Name: Paul [name] MRN: [id number] Epilepsy Attending: Lora Havens Referring Physician/Provider: Dr. Wendi Maya Date: 04/03/2020 Duration:  22.44 mins Patient history: 74 year old male who presented with syncope.  EEG evaluate for seizures. Level of alertness: Awake AEDs during EEG study: None Technical aspects: This EEG study was done with scalp electrodes positioned according to the 10-20 International system of electrode placement. Electrical activity was acquired at a sampling rate of 500Hz  and reviewed with a high frequency filter of 70Hz  and a low frequency filter of 1Hz . EEG data were recorded continuously and digitally stored. Description: The posterior dominant rhythm consists of 7 Hz activity of moderate voltage (25-35 uV) seen predominantly in posterior head regions, symmetric and reactive to eye opening and eye closing.  Hyperventilation and photic stimulation were not performed. Abnormality -Background slow IMPRESSION: This study is suggestive of mild diffuse encephalopathy, nonspecific etiology. No seizures or epileptiform discharges were seen throughout the recording. Lora Havens   ECHOCARDIOGRAM COMPLETE  Result Date: 04/02/2020    ECHOCARDIOGRAM REPORT   Patient Name:   Paul [name] Date of Exam: 04/02/2020 Medical Rec #:  [id number]    Height:       68.0 in Accession #:    [id number]   Weight:       132.6 lb Date of Birth:  06/26/1946     BSA:          1.716 m Patient Age:    76 years     BP:           100/56 mmHg Patient Gender: M            HR:           81 bpm. Exam Location:  Inpatient Procedure: 2D Echo, Cardiac Doppler and Color Doppler Indications:    Syncope  History:        Patient has prior history of Echocardiogram examinations, most                 recent 07/09/2019. CHF, COPD, Arrythmias:Atrial Fibrillation;                 Risk Factors:Dyslipidemia and Diabetes.  Sonographer:    Dustin Flock Referring Phys: [id number] [place]  1. Left ventricular ejection fraction, by estimation, is 60 to 65%. The left ventricle has normal function. The left ventricle has no regional wall motion abnormalities. Left  ventricular diastolic parameters were normal.  2. Right ventricular systolic function is normal. The right ventricular size is normal. There is normal pulmonary artery systolic pressure.  3. The mitral valve is normal in structure. Trivial mitral valve regurgitation. No evidence of mitral stenosis.  4. The aortic valve is tricuspid. Aortic valve regurgitation is not visualized. Mild aortic valve sclerosis is present, with no evidence of aortic valve stenosis.  5. The inferior vena cava is normal in size with greater than 50% respiratory variability, suggesting right atrial pressure of 3 mmHg. FINDINGS  Left Ventricle: Left ventricular ejection fraction, by estimation, is 60 to  65%. The left ventricle has normal function. The left ventricle has no regional wall motion abnormalities. The left ventricular internal cavity size was normal in size. There is  no left ventricular hypertrophy. Left ventricular diastolic parameters were normal. Normal left ventricular filling pressure. Right Ventricle: The right ventricular size is normal. No increase in right ventricular wall thickness. Right ventricular systolic function is normal. There is normal pulmonary artery systolic pressure. The tricuspid regurgitant velocity is 2.56 m/s, and  with an assumed right atrial pressure of 3 mmHg, the estimated right ventricular systolic pressure is 44.3 mmHg. Left Atrium: Left atrial size was normal in size. Right Atrium: Right atrial size was normal in size. Pericardium: There is no evidence of pericardial effusion. Mitral Valve: The mitral valve is normal in structure. Normal mobility of the mitral valve leaflets. Trivial mitral valve regurgitation. No evidence of mitral valve stenosis. Tricuspid Valve: The tricuspid valve is normal in structure. Tricuspid valve regurgitation is mild . No evidence of tricuspid stenosis. Aortic Valve: The aortic valve is tricuspid. Aortic valve regurgitation is not visualized. Mild aortic valve  sclerosis is present, with no evidence of aortic valve stenosis. Pulmonic Valve: The pulmonic valve was normal in structure. Pulmonic valve regurgitation is not visualized. No evidence of pulmonic stenosis. Aorta: The aortic root is normal in size and structure. Venous: The inferior vena cava is normal in size with greater than 50% respiratory variability, suggesting right atrial pressure of 3 mmHg. IAS/Shunts: The interatrial septum appears to be lipomatous. No atrial level shunt detected by color flow Doppler.  LEFT VENTRICLE PLAX 2D LVIDd:         4.40 cm  Diastology LVIDs:         2.58 cm  LV e' lateral:   10.30 cm/s LV PW:         0.99 cm  LV E/e' lateral: 8.8 LV IVS:        0.92 cm  LV e' medial:    8.92 cm/s LVOT diam:     1.80 cm  LV E/e' medial:  10.2 LV SV:         63 LV SV Index:   36 LVOT Area:     2.54 cm  RIGHT VENTRICLE RV Basal diam:  2.72 cm RV S prime:     11.30 cm/s TAPSE (M-mode): 2.5 cm LEFT ATRIUM             Index       RIGHT ATRIUM           Index LA diam:        2.90 cm 1.69 cm/m  RA Area:     14.10 cm LA Vol (A2C):   42.9 ml 25.00 ml/m RA Volume:   36.10 ml  21.03 ml/m LA Vol (A4C):   55.1 ml 32.11 ml/m LA Biplane Vol: 49.5 ml 28.84 ml/m  AORTIC VALVE LVOT Vmax:   103.00 cm/s LVOT Vmean:  73.600 cm/s LVOT VTI:    0.246 m  AORTA Ao Root diam: 2.80 cm MITRAL VALVE               TRICUSPID VALVE MV Area (PHT): 4.60 cm    TV Peak grad:   22.3 mmHg MV Decel Time: 165 msec    TV Vmax:        2.36 m/s MV E velocity: 90.80 cm/s  TR Peak grad:   26.2 mmHg MV A velocity: 92.40 cm/s  TR Vmax:  256.00 cm/s MV E/A ratio:  0.98                            SHUNTS                            Systemic VTI:  0.25 m                            Systemic Diam: 1.80 cm Fransico Him MD Electronically signed by Fransico Him MD Signature Date/Time: 04/02/2020/3:51:03 PM    Final    VAS US CAROTID  Result Date: 04/03/2020 Carotid Arterial Duplex Study Indications:       Syncope. Risk Factors:       Hypertension, hyperlipidemia. Comparison Study:  no prior Performing Technologist: Abram Sander RVS  Examination Guidelines: A complete evaluation includes B-mode imaging, spectral Doppler, color Doppler, and power Doppler as needed of all accessible portions of each vessel. Bilateral testing is considered an integral part of a complete examination. Limited examinations for reoccurring indications may be performed as noted.  Right Carotid Findings: +----------+--------+--------+--------+------------------+--------+           PSV cm/sEDV cm/sStenosisPlaque DescriptionComments +----------+--------+--------+--------+------------------+--------+ CCA Prox  72      9               heterogenous               +----------+--------+--------+--------+------------------+--------+ CCA Distal80      10              heterogenous               +----------+--------+--------+--------+------------------+--------+ ICA Prox  97      21      1-39%   heterogenous               +----------+--------+--------+--------+------------------+--------+ ICA Distal100     23                                         +----------+--------+--------+--------+------------------+--------+ ECA       318                                                +----------+--------+--------+--------+------------------+--------+ +----------+--------+-------+--------+-------------------+           PSV cm/sEDV cmsDescribeArm Pressure (mmHG) +----------+--------+-------+--------+-------------------+ [id number]                                        +----------+--------+-------+--------+-------------------+ +---------+--------+--+--------+--+---------+ VertebralPSV cm/s60EDV cm/s14Antegrade +---------+--------+--+--------+--+---------+  Left Carotid Findings: +----------+--------+--------+--------+------------------+--------+           PSV cm/sEDV cm/sStenosisPlaque DescriptionComments  +----------+--------+--------+--------+------------------+--------+ CCA Prox  98      19              heterogenous               +----------+--------+--------+--------+------------------+--------+ CCA Distal79      15              heterogenous               +----------+--------+--------+--------+------------------+--------+ ICA Prox  120     32      1-39%   heterogenous               +----------+--------+--------+--------+------------------+--------+ ICA Distal72      23                                         +----------+--------+--------+--------+------------------+--------+ ECA       277     10                                         +----------+--------+--------+--------+------------------+--------+ +----------+--------+--------+--------+-------------------+           PSV cm/sEDV cm/sDescribeArm Pressure (mmHG) +----------+--------+--------+--------+-------------------+ [id number]                                         +----------+--------+--------+--------+-------------------+ +---------+--------+--+--------+--+---------+ VertebralPSV cm/s95EDV cm/s22Antegrade +---------+--------+--+--------+--+---------+   Summary: Right Carotid: Velocities in the right ICA are consistent with a 1-39% stenosis. Left Carotid: Velocities in the left ICA are consistent with a 1-39% stenosis. Vertebrals: Bilateral vertebral arteries demonstrate antegrade flow. *See table(s) above for measurements and observations.     Preliminary         Scheduled Meds: . AeroChamber Plus Flo-Vu Large  1 each Other Once  . albuterol  2.5 mg Nebulization Q6H WA  . apixaban  5 mg Oral BID  . atorvastatin  40 mg Oral q1800  . bisoprolol  5 mg Oral Daily  . diltiazem  360 mg Oral Daily  . escitalopram  5 mg Oral Daily  . feeding supplement  1 Container Oral TID BM  . furosemide  40 mg Oral Daily  . guaiFENesin  600 mg Oral BID  . insulin aspart  0-20 Units Subcutaneous TID WC    . insulin aspart  0-5 Units Subcutaneous QHS  . insulin aspart  2 Units Subcutaneous TID WC  . loratadine  10 mg Oral Daily  . LORazepam  0.5 mg Oral BID  . melatonin  9 mg Oral QHS  . mometasone-formoterol  2 puff Inhalation BID  . multivitamin with minerals  1 tablet Oral Daily  . pantoprazole  40 mg Oral Daily  . predniSONE  40 mg Oral Q breakfast  . roflumilast  250 mcg Oral Daily  . sodium chloride flush  3 mL Intravenous Q12H  . umeclidinium bromide  1 puff Inhalation Daily   Continuous Infusions: . sodium chloride 75 mL/hr at 04/04/20 0210     LOS: 3 days        Hosie Poisson, MD [facility]   To contact the attending provider between 7A-7P or the covering provider during after hours 7P-7A, please log into the web site [email or web address] and access using universal [place] password for that web site. If you do not have the password, please call the hospital operator.  04/04/2020, 8:57 AM

## 2020-04-05 DIAGNOSIS — E1169 Type 2 diabetes mellitus with other specified complication: Secondary | ICD-10-CM

## 2020-04-05 DIAGNOSIS — I5032 Chronic diastolic (congestive) heart failure: Secondary | ICD-10-CM

## 2020-04-05 DIAGNOSIS — J441 Chronic obstructive pulmonary disease with (acute) exacerbation: Secondary | ICD-10-CM | POA: Diagnosis not present

## 2020-04-05 DIAGNOSIS — J9601 Acute respiratory failure with hypoxia: Secondary | ICD-10-CM

## 2020-04-05 DIAGNOSIS — I480 Paroxysmal atrial fibrillation: Secondary | ICD-10-CM

## 2020-04-05 DIAGNOSIS — E782 Mixed hyperlipidemia: Secondary | ICD-10-CM

## 2020-04-05 LAB — GLUCOSE, CAPILLARY
Glucose-Capillary: 86 mg/dL (ref 70–99)
Glucose-Capillary: 63 mg/dL — ABNORMAL LOW (ref 70–99)
Glucose-Capillary: 86 mg/dL (ref 70–99)
Glucose-Capillary: 188 mg/dL — ABNORMAL HIGH (ref 70–99)
Glucose-Capillary: 146 mg/dL — ABNORMAL HIGH (ref 70–99)

## 2020-04-05 MED ORDER — LOPERAMIDE HCL 2 MG PO CAPS
4.0000 mg | ORAL_CAPSULE | Freq: Once | ORAL | Status: AC
  Administered 2020-04-05: 4 mg via ORAL
  Filled 2020-04-05: qty 2

## 2020-04-05 NOTE — TOC Initial Note (Signed)
Transition of Care [facility]) - Initial/Assessment Note    Patient Details  Name: Paul [name] MRN: [id number] Date of Birth: 09-28-1946  Transition of Care [facility]) CM/SW Contact:    Zenon Mayo, RN Phone Number: 04/05/2020, 12:40 PM  Clinical Narrative:                 From home with wife, needing hhpt, NCM offered choice,wife states they would like [facility] since they have their DME with them.  NCM made referral to [facility] with [facility] for HHPT.  She is able to take referral.  Soc will begin 24 to 48 hrs post dc.  Patient also has home oxygen with [employer] 2 to 3 liters.  Wife will bring oxygen tank when she transports him home.  He also has [facility] services with [facility] [place].   Expected Discharge Plan: [place] Barriers to Discharge: No Barriers Identified   Patient Goals and CMS Choice Patient states their goals for this hospitalization and ongoing recovery are:: get better CMS Medicare.gov Compare Post Acute Care list provided to:: Patient Represenative (must comment) Choice offered to / list presented to : Spouse  Expected Discharge Plan and Services Expected Discharge Plan: [place]   Discharge Planning Services: CM Consult Post Acute Care Choice: Home Health, Durable Medical Equipment Living arrangements for the past 2 months: Single Family Home                 DME Arranged: Walker rolling with seat DME Agency: [employer] Date DME Agency Contacted: 04/05/20 Time DME Agency Contacted: [phone] Representative spoke with at DME Agency: [place]: PT [place]: [place] ([place]) Date [place]: 04/05/20 Time [place]: 43 Representative spoke with at [place]: Butch Penny  Prior Living Arrangements/Services Living arrangements for the past 2 months: Single Family Home Lives with:: Spouse Patient language and need for interpreter reviewed:: Yes Do you feel safe going back to  the place where you live?: Yes      Need for Family Participation in Patient Care: Yes (Comment) Care giver support system in place?: Yes (comment) Current home services: (outpatient pallitive services with care connections) Criminal Activity/Legal Involvement Pertinent to Current Situation/Hospitalization: No - Comment as needed  Activities of Daily Living Home Assistive Devices/Equipment: Oxygen ADL Screening (condition at time of admission) Patient's cognitive ability adequate to safely complete daily activities?: Yes Is the patient deaf or have difficulty hearing?: No Does the patient have difficulty seeing, even when wearing glasses/contacts?: No Does the patient have difficulty concentrating, remembering, or making decisions?: No Patient able to express need for assistance with ADLs?: No Does the patient have difficulty dressing or bathing?: No Independently performs ADLs?: Yes (appropriate for developmental age) Does the patient have difficulty walking or climbing stairs?: No Weakness of Legs: None Weakness of Arms/Hands: None  Permission Sought/Granted                  Emotional Assessment Appearance:: Appears stated age Attitude/Demeanor/Rapport: Engaged Affect (typically observed): Appropriate Orientation: : Oriented to Self, Oriented to Place, Oriented to  Time, Oriented to Situation Alcohol / Substance Use: Not Applicable Psych Involvement: No (comment)  Admission diagnosis:  COPD exacerbation ([place]) [J44.1] Orthostatic syncope [I95.1] Patient Active Problem List   Diagnosis Date Noted  . Syncope 04/01/2020  . Chronic diastolic CHF (congestive heart failure) ([place]) 04/01/2020  . COPD exacerbation ([place]) 03/04/2020  . Acute respiratory failure with hypoxia ([place]) 02/17/2020  .  Osteoporosis with current pathological fracture 02/06/2020  . Chronic cough 01/31/2020  . Essential hypertension 01/28/2020  . Encounter for general adult medical examination with abnormal  findings 01/25/2020  . Chronic bilateral thoracic back pain 01/25/2020  . 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[place]) 01/25/2020  . HLD (hyperlipidemia) 01/05/2020  . Vertebral compression fracture ([place]) 05/29/2019  . Chronic anemia 05/29/2019  . Pulmonary nodules 05/29/2019  . COPD with acute exacerbation ([place]) 05/28/2019  . Chronic anticoagulation 04/06/2019  . Coronary artery calcification 04/06/2019  . Atherosclerosis of aorta ([place]) 04/06/2019  . Paroxysmal atrial fibrillation (HCC)   . Acute on chronic respiratory failure with hypoxia ([place])   . Lung nodule < 6cm on CT 05/29/2016  . Former cigarette smoker 12/05/2015  . Chronic respiratory failure with hypoxia ([place]) 01/27/2013  . COPD GOLD  III     PCP:  [name], MD Pharmacy:   [facility] [address] [place] [address] Phone: [phone] Fax: [phone]     Social Determinants of Health (SDOH) Interventions    Readmission Risk Interventions Readmission Risk Prevention Plan 04/05/2020 06/01/2019 05/06/2019  Transportation Screening Complete Complete Complete  PCP or Specialist Appt within 3-5 Days - Not Complete Complete  Not Complete comments - not ready for discharge  -  [place] or [place] - Complete Complete  Social Work Consult for [place] Planning/Counseling - Complete Complete  Palliative Care Screening - Not Applicable Complete  Medication Review Press photographer) Complete Complete Complete  PCP or Specialist appointment within 3-5 days of discharge Complete - -  [place] or Home Care Consult Complete - -  SW Recovery Care/Counseling Consult Complete - -  Palliative Care Screening Complete - -  [place] Not Applicable - -  Some recent data might be hidden

## 2020-04-05 NOTE — Progress Notes (Addendum)
PROGRESS NOTE  Paul [name] [id number] DOB: December 29, 1945 DOA: 04/01/2020 PCP: [name], MD   Brief Narrative: 74 year old gentleman prior history of chronic COPD oxygen dependent, paroxysmal atrial fibrillation on Eliquis, chronic diastolic heart failure, hyperlipidemia, diabetes mellitus, multiple recurrent hospital admissions for COPD exacerbation presents to ED initially on April 4 after an episode of syncope was found to be orthostatic and was discharged home.  He presents this admission with another syncopal episode, and worsening shortness of breath.  He was referred to [facility] for admission for acute COPD exacerbation, recurrent syncope episodes. Extensive work up revealed orthostatic hypotension which was resolved with IV fluids. Therapy evaluations recommending Home health PT.    HPI/Recap of past 24 hours:  Blood glucose 63, he feels shaky Diarrhea has resolved Less sob, but report not back to baseline yet  Assessment/Plan: Principal Problem:   COPD with acute exacerbation (HCC) Active Problems:   Paroxysmal atrial fibrillation (HCC)   HLD (hyperlipidemia)   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HCC)   Acute respiratory failure with hypoxia (HCC)   COPD exacerbation (HCC)   Syncope   Chronic diastolic CHF (congestive heart failure) (HCC)  Acute respiratory failure with hypoxia probably secondary to acute on chronic COPD exacerbation  Pt had upto 5 admissions in the last 6 months for recurrent COPD exacerbations. Pt was enrolled in [facility] services on 03/21/20 for advanced COPD Slowly improving, f/u with Dr Melvyn Novas  Diarrhea:  Stopped daliresp could cause diarrhea as well F/u with Dr Melvyn Novas  Recurrent syncope along with dizziness Differentials include cardiogenic versus orthostatic/vagal induced. Suspect a component of hypoxia too.   Orthostatics on admission were positive but repeat Orthostatic vital signs the last two days within normal  limits. Echocardiogram ordered and within normal limits.  Overnight telemetry does not show/reveal arrhthymia's/ heart blocks.  EEG negative for seizures or epileptiform discharges on the recording.  Carotid duplex did not show any significant stenosis.. TED hoses   pt negative for BPPV as well as all other vestibular issues. May need event monitor if reoccur, follow up with pcp  Paroxysmal atrial fibrillation Rate control, continue with bisoprolol and Cardizem and Eliquis for anticoagulation. His CHA2DS2-VASc score greater than 5 Echocardiogram showed preserved left ventricular ejection fraction, normal diastolic function  History of chronic diastolic heart failure Patient appears to be compensated at this time. Chest x-ray does not show any vascular congestion or effusions. Continue with Lasix  Insulin dependent dm2, with hyperglycemia and  Hypoglycemia a1c 7.3  Anemia of chronic disease Hemoglobin stable around 10.  DVT Prophylaxis: eliquis  Code Status: partial   Family Communication: patient   Disposition Plan:    Patient came from:                home with wife, reports wife recently had shoulder surgery                                                                                          Anticipated d/c place: home with home health and home o2, resume community palliative care at discharge  Barriers to d/c OR conditions which need to be  met to effect a safe d/c:    Consultants:  none  Procedures:  EEG  Antibiotics:  Doxycycline    Objective: BP 128/62 (BP Location: Right Arm)   Pulse 83   Temp 98 F (36.7 C) (Oral)   Resp 20   Ht 5' 8"  (1.727 m)   Wt 62.1 kg   SpO2 99%   BMI 20.83 kg/m   Intake/Output Summary (Last 24 hours) at 04/05/2020 1118 Last data filed at 04/05/2020 1112 Gross per 24 hour  Intake 1835.36 ml  Output 2601 ml  Net -765.64 ml   Filed Weights   04/03/20 0500 04/04/20 0627 04/05/20 0526  Weight: 61.5 kg 62.6 kg 62.1 kg     Exam: Patient is examined daily including today on 04/05/2020, exams remain the same as of yesterday except that has changed    General:  NAD  Cardiovascular: RRR  Respiratory: overall diminished, no wheezing  Abdomen: Soft/ND/NT, positive BS  Musculoskeletal: No Edema  Neuro: alert, oriented   Data Reviewed: Basic Metabolic Panel: Recent Labs  Lab 04/01/20 1328 04/02/20 0433 04/03/20 0907  NA 141 143 141  K 4.1 3.6 3.9  CL 97* 103 104  CO2 32 32 27  GLUCOSE 181* 177* 176*  BUN 12 10 13   CREATININE 0.77 0.66 0.64  CALCIUM 8.8* 8.0* 8.0*   Liver Function Tests: No results for input(s): AST, ALT, ALKPHOS, BILITOT, PROT, ALBUMIN in the last 168 hours. No results for input(s): LIPASE, AMYLASE in the last 168 hours. No results for input(s): AMMONIA in the last 168 hours. CBC: Recent Labs  Lab 04/01/20 1328 04/02/20 0433 04/03/20 0907  WBC 16.7* 8.8 15.5*  NEUTROABS 15.5*  --   --   HGB 11.0* 9.0* 10.1*  HCT 36.0* 29.1* 32.5*  MCV 84.7 82.9 84.2  PLT 417* 354 382   Cardiac Enzymes:   No results for input(s): CKTOTAL, CKMB, CKMBINDEX, TROPONINI in the last 168 hours. BNP (last 3 results) Recent Labs    02/02/20 1200 02/16/20 1726 03/04/20 1220  BNP 87.4 88.9 82.1    ProBNP (last 3 results) No results for input(s): PROBNP in the last 8760 hours.  CBG: Recent Labs  Lab 04/04/20 1132 04/04/20 1618 04/04/20 2116 04/05/20 0608 04/05/20 1058  GLUCAP 88 250* 123* 86 63*    Recent Results (from the past 240 hour(s))  Respiratory Panel by RT PCR (Flu A&B, Covid) - Nasopharyngeal Swab     Status: None   Collection Time: 04/01/20  2:37 PM   Specimen: Nasopharyngeal Swab  Result Value Ref Range Status   SARS Coronavirus 2 by RT PCR NEGATIVE NEGATIVE Final    Comment: (NOTE) SARS-CoV-2 target nucleic acids are NOT DETECTED. The SARS-CoV-2 RNA is generally detectable in upper respiratoy specimens during the acute phase of infection. The  lowest concentration of SARS-CoV-2 viral copies this assay can detect is 131 copies/mL. A negative result does not preclude SARS-Cov-2 infection and should not be used as the sole basis for treatment or other patient management decisions. A negative result may occur with  improper specimen collection/handling, submission of specimen other than nasopharyngeal swab, presence of viral mutation(s) within the areas targeted by this assay, and inadequate number of viral copies (<131 copies/mL). A negative result must be combined with clinical observations, patient history, and epidemiological information. The expected result is Negative. Fact Sheet for Patients:  PinkCheek.be Fact Sheet for Healthcare Providers:  GravelBags.it This test is not yet ap proved or cleared by the [place] FDA  and  has been authorized for detection and/or diagnosis of SARS-CoV-2 by FDA under an Emergency Use Authorization (EUA). This EUA will remain  in effect (meaning this test can be used) for the duration of the COVID-19 declaration under Section 564(b)(1) of the Act, 21 U.S.C. section 360bbb-3(b)(1), unless the authorization is terminated or revoked sooner.    Influenza A by PCR NEGATIVE NEGATIVE Final   Influenza B by PCR NEGATIVE NEGATIVE Final    Comment: (NOTE) The Xpert Xpress SARS-CoV-2/FLU/RSV assay is intended as an aid in  the diagnosis of influenza from Nasopharyngeal swab specimens and  should not be used as a sole basis for treatment. Nasal washings and  aspirates are unacceptable for Xpert Xpress SARS-CoV-2/FLU/RSV  testing. Fact Sheet for Patients: PinkCheek.be Fact Sheet for Healthcare Providers: GravelBags.it This test is not yet approved or cleared by the [place] FDA and  has been authorized for detection and/or diagnosis of SARS-CoV-2 by  FDA under an Emergency  Use Authorization (EUA). This EUA will remain  in effect (meaning this test can be used) for the duration of the  Covid-19 declaration under Section 564(b)(1) of the Act, 21  U.S.C. section 360bbb-3(b)(1), unless the authorization is  terminated or revoked. Performed at [facility], [place] [address] [place], [place] [address]      Studies: No results found.  Scheduled Meds: . AeroChamber Plus Flo-Vu Large  1 each Other Once  . albuterol  2.5 mg Nebulization Q6H WA  . apixaban  5 mg Oral BID  . atorvastatin  40 mg Oral q1800  . bisoprolol  5 mg Oral Daily  . diltiazem  360 mg Oral Daily  . escitalopram  5 mg Oral Daily  . feeding supplement  1 Container Oral TID BM  . furosemide  40 mg Oral Daily  . guaiFENesin  600 mg Oral BID  . insulin aspart  0-20 Units Subcutaneous TID WC  . insulin aspart  0-5 Units Subcutaneous QHS  . insulin aspart  2 Units Subcutaneous TID WC  . loratadine  10 mg Oral Daily  . LORazepam  0.5 mg Oral BID  . melatonin  9 mg Oral QHS  . mometasone-formoterol  2 puff Inhalation BID  . multivitamin with minerals  1 tablet Oral Daily  . pantoprazole  40 mg Oral Daily  . predniSONE  40 mg Oral Q breakfast  . roflumilast  250 mcg Oral Daily  . senna-docusate  2 tablet Oral BID  . sodium chloride flush  3 mL Intravenous Q12H  . umeclidinium bromide  1 puff Inhalation Daily    Continuous Infusions:   Time spent: 43mns I have personally reviewed and interpreted on  04/05/2020 daily labs, tele strips, imagings as discussed above under date review session and assessment and plans.  I reviewed all nursing notes, pharmacy notes,  vitals, pertinent old records  I have discussed plan of care as described above with RN , patient  on 04/05/2020   FFlorencia ReasonsMD, PhD, FACP  [facility]  Available via Epic secure chat 7am-7pm for nonurgent issues Please page for urgent issues, pager number available through [email or web address] .   04/05/2020, 11:18 AM  LOS:  4 days

## 2020-04-05 NOTE — Discharge Instructions (Signed)

## 2020-04-05 NOTE — Progress Notes (Signed)
Hypoglycemic Event  CBG: 63  Treatment: 4 oz juice/soda  Symptoms: None  Follow-up CBG: Time:15 CBG Result:86  Possible Reasons for Event: Unknown  Comments/MD notified:per protocol    Paul [name]

## 2020-04-05 NOTE — Care Management Important Message (Signed)
Important Message  Patient Details  Name: Paul [name] MRN: [id number] Date of Birth: 1946-06-30   Medicare Important Message Given:  Yes     Shelda Altes 04/05/2020, 10:00 AM

## 2020-04-05 NOTE — Progress Notes (Signed)
Nutrition Follow-up  RD working remotely.  DOCUMENTATION CODES:   Not applicable  INTERVENTION:   -Continue Boost Breeze po TID, each supplement provides 250 kcal and 9 grams of protein -Continue MVI with minerals daily  NUTRITION DIAGNOSIS:   Increased nutrient needs related to chronic illness(COPD) as evidenced by estimated needs.  Ongoing  GOAL:   Patient will meet greater than or equal to 90% of their needs  Progressing   MONITOR:   PO intake, Supplement acceptance, Labs, Weight trends, Skin, I & O's  REASON FOR ASSESSMENT:   Consult Assessment of nutrition requirement/status  ASSESSMENT:   Paul [name] is a 74 y.o. male with medical history significant of COPD, paroxymal A Fib, chronic diastolic heart failure, HLD, DM.  Patient has had multiple COPD exacerbation hospitalization in the past several months, January 12-19, February 10-15, February 24-28, and again in March 13-18. He presented to ED on April 4 after syncopal episode, was given IVF with improvement in orthostatics and discharged from the ED. He now presents today due to another syncopal episode as well as shortness of breath. Per EDP, wife had reported syncopal episode with LOC. However, patient denies LOC today, states that he felt dizzy and bit nauseous and sat down. He reports SOB with productive cough of yellow/green sputum. He denies any fever, chest pain, decreased oral intake, vomiting, abdominal pain.  He has had diarrhea for the past 4 days while he was on azithromycin.  Reviewed I/O's: +796 ml x 24 hours and +3 L since admission  UOP: 2.2 L x 24 hours  Attempted to speak with pt via phone, however, no answer.   Pt with improved appetite. Noted meal completion 25-95%. Pt is consuming his Boost Breeze supplements per MAR.   Per MD notes, antibiotics were d/c secondary to diarrhea.   Plan to d/c home with home health services once medically stable.   Medications reviewed and include cardizem,  prednisone, and lasix.   Labs reviewed: CBGS: 86-250 (inpatient orders for glycemic control are 0-20 units insulin aspart TID with meals, 0-5 units insulin aspart q HS, and 2 units insulin aspart TID with meals).   Diet Order:   Diet Order            Diet Carb Modified Fluid consistency: Thin; Room service appropriate? Yes  Diet effective now              EDUCATION NEEDS:   Education needs have been addressed  Skin:  Skin Assessment: Reviewed RN Assessment  Last BM:  04/02/20  Height:   Ht Readings from Last 1 Encounters:  04/01/20 5\' 8"  (1.727 m)    Weight:   Wt Readings from Last 1 Encounters:  04/05/20 62.1 kg    Ideal Body Weight:  70 kg  BMI:  Body mass index is 20.83 kg/m.  Estimated Nutritional Needs:   Kcal:  1950-2150  Protein:  90-105 grams  Fluid:  > 1.9 L    Loistine Chance, RD, LDN, [place] Registered Dietitian II Certified Diabetes Care and Education Specialist Please refer to [facility] for RD and/or RD on-call/weekend/after hours pager

## 2020-04-06 ENCOUNTER — Ambulatory Visit: Payer: Medicare Other | Admitting: Internal Medicine

## 2020-04-06 DIAGNOSIS — I5032 Chronic diastolic (congestive) heart failure: Secondary | ICD-10-CM | POA: Diagnosis not present

## 2020-04-06 DIAGNOSIS — E1169 Type 2 diabetes mellitus with other specified complication: Secondary | ICD-10-CM | POA: Diagnosis not present

## 2020-04-06 DIAGNOSIS — E876 Hypokalemia: Secondary | ICD-10-CM

## 2020-04-06 DIAGNOSIS — J9601 Acute respiratory failure with hypoxia: Secondary | ICD-10-CM | POA: Diagnosis not present

## 2020-04-06 DIAGNOSIS — J441 Chronic obstructive pulmonary disease with (acute) exacerbation: Secondary | ICD-10-CM | POA: Diagnosis not present

## 2020-04-06 LAB — CBC
HCT: 30.7 % — ABNORMAL LOW (ref 39.0–52.0)
Hemoglobin: 9.7 g/dL — ABNORMAL LOW (ref 13.0–17.0)
MCH: 26 pg (ref 26.0–34.0)
MCHC: 31.6 g/dL (ref 30.0–36.0)
MCV: 82.3 fL (ref 80.0–100.0)
Platelets: 353 10*3/uL (ref 150–400)
RBC: 3.73 MIL/uL — ABNORMAL LOW (ref 4.22–5.81)
RDW: 16.8 % — ABNORMAL HIGH (ref 11.5–15.5)
WBC: 9.3 10*3/uL (ref 4.0–10.5)
nRBC: 0 % (ref 0.0–0.2)

## 2020-04-06 LAB — BASIC METABOLIC PANEL
Anion gap: 9 (ref 5–15)
BUN: 9 mg/dL (ref 8–23)
CO2: 36 mmol/L — ABNORMAL HIGH (ref 22–32)
Calcium: 8.3 mg/dL — ABNORMAL LOW (ref 8.9–10.3)
Chloride: 97 mmol/L — ABNORMAL LOW (ref 98–111)
Creatinine, Ser: 0.58 mg/dL — ABNORMAL LOW (ref 0.61–1.24)
GFR calc Af Amer: >60 mL/min (ref >60)
GFR calc non Af Amer: >60 mL/min (ref >60)
Glucose, Bld: 96 mg/dL (ref 70–99)
Potassium: 2.7 mmol/L — CL (ref 3.5–5.1)
Sodium: 142 mmol/L (ref 135–145)

## 2020-04-06 LAB — GLUCOSE, CAPILLARY: Glucose-Capillary: 89 mg/dL (ref 70–99)

## 2020-04-06 MED ORDER — PREDNISONE 10 MG PO TABS: ORAL_TABLET | ORAL | 0 refills | Status: DC

## 2020-04-06 MED ORDER — DALIRESP 250 MCG PO TABS: 250.0000 ug | ORAL_TABLET | Freq: Every day | ORAL | 0 refills | Status: DC

## 2020-04-06 MED ORDER — MAGNESIUM SULFATE 2 GM/50ML IV SOLN
2.0000 g | Freq: Once | INTRAVENOUS | Status: AC
  Administered 2020-04-06: 2 g via INTRAVENOUS
  Filled 2020-04-06: qty 50

## 2020-04-06 MED ORDER — POTASSIUM CHLORIDE CRYS ER 20 MEQ PO TBCR
40.0000 meq | EXTENDED_RELEASE_TABLET | ORAL | Status: DC
  Administered 2020-04-06: 09:00:00 40 meq via ORAL
  Filled 2020-04-06: qty 2

## 2020-04-06 MED ORDER — POTASSIUM CHLORIDE CRYS ER 20 MEQ PO TBCR: 20.0000 meq | EXTENDED_RELEASE_TABLET | Freq: Every day | ORAL | 0 refills | Status: DC

## 2020-04-06 NOTE — Progress Notes (Addendum)
   04/06/20 0900   PT - Assessment/Plan  Follow Up Recommendations Home health PT  PT equipment 3in1 (PT);Wheelchair (18x16 lightweight wheelchair with anti tippers, foot rests and desk armrests);Wheelchair cushion (18x16 pressure relieving cushion) (rollator)  Pt got rollator yesterday and has 3N1 at home.  He has decided he doesn't want the wheelchair now.  He refused to walk at this time and should d/c today.  Philander Ake W,PT Acute Rehabilitation Services Pager:  [phone]  Office:  [phone]

## 2020-04-06 NOTE — Discharge Summary (Addendum)
Discharge Summary  Paul [name] [id number] DOB: 17-May-1946  PCP: [name], MD  Admit date: 04/01/2020 Discharge date: 04/06/2020  Time spent: 54mins, more than 50% time spent on coordination of care.  Recommendations for Outpatient Follow-up:  1. F/u with PCP within a week  for hospital discharge follow up, repeat cbc/bmp at follow up 2. F/u with pulmonology on 4/20 3. F/u with community palliative care service 4. Home health/ DME rolling walker ordered prior to discharge 5. Continue home 02  Discharge Diagnoses:  Active Hospital Problems   Diagnosis Date Noted  . COPD with acute exacerbation ([place]) 05/28/2019  . Syncope 04/01/2020  . Chronic diastolic CHF (congestive heart failure) ([place]) 04/01/2020  . COPD exacerbation ([place]) 03/04/2020  . Acute respiratory failure with hypoxia ([place]) 02/17/2020  . 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[place]) 01/25/2020  . HLD (hyperlipidemia) 01/05/2020  . Paroxysmal atrial fibrillation [facility])     Resolved Hospital Problems  No resolved problems to display.    Discharge Condition: stable  Diet recommendation: heart healthy/carb modified  Filed Weights   04/04/20 0627 04/05/20 0526 04/06/20 0350  Weight: 62.6 kg 62.1 kg 60.5 kg    History of present illness: (per admitting Md Dr Maylene Roes) PCP: [name], MD  Patient coming from: Home  Chief Complaint: Syncope, SOB  HPI: Paul [name] is a 74 y.o. male with medical history significant of COPD, paroxymal A Fib, chronic diastolic heart failure, HLD, DM.  Patient has had multiple COPD exacerbation hospitalization in the past several months, January 12-19, February 10-15, February 24-28, and again in March 13-18. He presented to ED on April 4 after syncopal episode, was given IVF with improvement in orthostatics and discharged from the ED. He now presents today due to another syncopal episode as well as shortness of breath. Per EDP, wife had reported syncopal episode with LOC.  However, patient denies LOC today, states that he felt dizzy and bit nauseous and sat down. He reports SOB with productive cough of yellow/green sputum. He denies any fever, chest pain, decreased oral intake, vomiting, abdominal pain.  He has had diarrhea for the past 4 days while he was on azithromycin.   ED Course: Patient was given IVF with improvement in orthostatic symptoms. However, on ambulation, he became very short of breath. WBC 16.7 (taking prednisone), Influenza and COVID negative. UA negative. CXR shows hyperinflation of lungs. [facility] called for admission for COPD exacerbation  Hospital Course:  Principal Problem:   COPD with acute exacerbation ([place]) Active Problems:   Paroxysmal atrial fibrillation ([facility])   HLD (hyperlipidemia)   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[facility])   Acute respiratory failure with hypoxia ([facility])   COPD exacerbation ([facility])   Syncope   Chronic diastolic CHF (congestive heart failure) ([facility])  Acute respiratory failure with hypoxia probably secondary to acute on chronic COPD exacerbation Pt had upto 5 admissions in the last 6 months for recurrent COPD exacerbations. Pt was enrolled in [facility] on 03/21/20 for advanced COPD Slowly improving, discharge on prednisone taper, continue home nebulizer and Daliresp, f/u with Dr Melvyn Novas  Diarrhea: resolved daliresp could cause diarrhea as well F/u with Dr Melvyn Novas  Recurrent syncopealong with dizziness Differentials include cardiogenic versus orthostatic/vagal induced.Suspect a component of hypoxia too.  Orthostaticson admission were positive but repeat Orthostatic vital signs the last two dayswithin normal limits. Echocardiogram orderedand within normal limits.   telemetry has been sinus rhythm, does not show/reveal arrhthymia's/ heart blocks.  EEGnegative for seizures or epileptiform  discharges on the recording.  Carotid duplex did not show any significant stenosis. TED hoses  pt  negative for BPPV as well as all other vestibular issues. May need event monitor if reoccur, follow up with pcp.  Paroxysmal atrial fibrillation Has been in sinus rhythm in the hospital,  continue with bisoprolol and Cardizem and Eliquis for anticoagulation. His CHA2DS2-VASc score greater than 5 Echocardiogramshowed preserved left ventricular ejection fraction, normal diastolic function  History of chronic diastolic heart failure Patient appears to be compensated at this time. Chest x-ray does not show any vascular congestion or effusions. Continue home dose Lasix  Hypokalemia, replace K.  noninsulin dependent dm2, with hyperglycemia and  Hypoglycemia a1c 7.3 Diet controlled  Anemia of chronic disease Hemoglobin stable around 10.  DVT Prophylaxis: eliquis  Code Status: partial , no intubation   Family Communication:  Wife over the phone  Disposition Plan:    Patient came from:home with wife, reports wife recently had shoulder surgery  Anticipated d/c place:home with home health and home o2, resume community palliative care at discharge    Consultants:  none  Procedures:  EEG  Antibiotics:  Doxycycline    Discharge Exam: BP (!) 129/55   Pulse 96   Temp 98.1 F (36.7 C) (Oral)   Resp 16   Ht 5\' 8"  (1.727 m)   Wt 60.5 kg   SpO2 97%   BMI 20.27 kg/m   General: NAD Cardiovascular: RRR Respiratory: diminished, but no wheezing, no rales, no rhonchi  Discharge Instructions You were cared for by a hospitalist during your hospital stay. If you have any questions about your discharge medications or the care you received while you were in the hospital after you are discharged, you can call the unit and asked to speak with the hospitalist on call if the hospitalist that took care of you is not available. Once you are discharged, your primary  care physician will handle any further medical issues. Please note that NO REFILLS for any discharge medications will be authorized once you are discharged, as it is imperative that you return to your primary care physician (or establish a relationship with a primary care physician if you do not have one) for your aftercare needs so that they can reassess your need for medications and monitor your lab values.  Discharge Instructions    Diet - low sodium heart healthy   Complete by: As directed    Carb modified diet   Increase activity slowly   Complete by: As directed      Allergies as of 04/06/2020      Reactions   Alendronate Other (See Comments)   Jittery/nervous      Medication List    TAKE these medications   acetaminophen 500 MG tablet Commonly known as: TYLENOL Take 1,000 mg by mouth every 6 (six) hours as needed for mild pain or headache.   apixaban 5 MG Tabs tablet Commonly known as: ELIQUIS Take 1 tablet (5 mg total) by mouth 2 (two) times daily.   atorvastatin 40 MG tablet Commonly known as: LIPITOR TAKE 1 TABLET(40 MG) BY MOUTH DAILY AFTER SUPPER What changed:   how much to take  how to take this  when to take this  additional instructions   bisoprolol 5 MG tablet Commonly known as: ZEBETA Take 5 mg by mouth daily.   budesonide-formoterol 160-4.5 MCG/ACT inhaler Commonly known as: SYMBICORT Inhale 2 puffs into the lungs 2 (two) times daily.   Calcium 200 MG Tabs Take  600 mg by mouth daily.   cholecalciferol 25 MCG (1000 UNIT) tablet Commonly known as: VITAMIN D3 Take 1,000 Units by mouth daily.   Daliresp 250 MCG Tabs Generic drug: Roflumilast Take 250 mcg by mouth daily. What changed: Another medication with the same name was changed. Make sure you understand how and when to take each.   Daliresp 250 MCG Tabs Generic drug: Roflumilast Take 250 mcg by mouth at bedtime. What changed: how much to take   denosumab 60 MG/ML Sosy  injection Commonly known as: PROLIA Inject 60 mg into the skin every 6 (six) months.   diltiazem 360 MG 24 hr capsule Commonly known as: TIAZAC Take 1 capsule (360 mg total) by mouth daily.   escitalopram 5 MG tablet Commonly known as: LEXAPRO Take 5 mg by mouth daily.   furosemide 40 MG tablet Commonly known as: LASIX Take 1 tablet (40 mg total) by mouth daily.   guaiFENesin 600 MG 12 hr tablet Commonly known as: MUCINEX Take 600 mg by mouth 2 (two) times daily.   loratadine 10 MG tablet Commonly known as: CLARITIN Take 10 mg by mouth daily.   LORazepam 0.5 MG tablet Commonly known as: ATIVAN Take 0.5 mg by mouth 2 (two) times daily.   melatonin 5 MG Tabs Take 10 mg by mouth at bedtime.   omeprazole 20 MG capsule Commonly known as: PRILOSEC Take 20 mg by mouth in the morning and at bedtime.   potassium chloride SA 20 MEQ tablet Commonly known as: KLOR-CON Take 1 tablet (20 mEq total) by mouth daily for 14 days.   predniSONE 10 MG tablet Commonly known as: DELTASONE Take 4tabs on day one, 3tabs on day two, 2tabs on day three, 1tab on day four, then stop.   ProAir HFA 108 (90 Base) MCG/ACT inhaler Generic drug: albuterol Inhale 2 puffs into the lungs every 6 (six) hours as needed for wheezing or shortness of breath.   albuterol (2.5 MG/3ML) 0.083% nebulizer solution Commonly known as: PROVENTIL Take 2.5 mg by nebulization in the morning and at bedtime.   Spiriva Respimat 2.5 MCG/ACT Aers Generic drug: Tiotropium Bromide Monohydrate Inhale 2 puffs into the lungs daily.   vitamin C 250 MG tablet Commonly known as: ASCORBIC ACID Take 500 mg by mouth daily.            Durable Medical Equipment  (From admission, onward)         Start     Ordered   04/05/20 1234  For home use only DME 4 wheeled rolling walker with seat  Once    Question:  Patient needs a walker to treat with the following condition  Answer:  Weakness   04/05/20 1233          Allergies  Allergen Reactions  . Alendronate Other (See Comments)    Jittery/nervous   Follow-up Information    [facility] Follow up.   Why: [id number] [place] Follow up.   Why: [facility] Contact information: [address] [place] [phone]       [name], MD.   Specialties: Family Medicine, Interventional Cardiology, Radiology, Anesthesiology Contact information: [place] [address] [phone]            The results of significant diagnostics from this hospitalization (including imaging, microbiology, ancillary and laboratory) are listed below for reference.    Significant Diagnostic  Studies: DG Chest 2 View  Result Date: 03/27/2020 CLINICAL DATA:  Cough and shortness of breath. EXAM: CHEST - 2 VIEW COMPARISON:  03/04/2020 FINDINGS: Aortic calcifications are noted. There is no pneumothorax. No large pleural effusion. The lungs are hyperexpanded. Multilevel compression fractures are noted throughout the thoracic spine as before. There is diffuse osteopenia of the visualized osseous structures. IMPRESSION: 1. No acute cardiopulmonary process. 2. COPD. Electronically Signed   By: Constance Holster M.D.   On: 03/27/2020 00:22   CT Head Wo Contrast  Result Date: 03/27/2020 CLINICAL DATA:  Syncope EXAM: CT HEAD WITHOUT CONTRAST TECHNIQUE: Contiguous axial images were obtained from the base of the skull through the vertex without intravenous contrast. COMPARISON:  None. FINDINGS: Brain: There is no mass, hemorrhage or extra-axial collection. The size and configuration of the ventricles and extra-axial CSF spaces are normal. The brain parenchyma is normal, without acute or chronic infarction. Vascular: No abnormal hyperdensity of the major intracranial arteries or dural venous sinuses. No intracranial atherosclerosis. Skull: The visualized skull base, calvarium  and extracranial soft tissues are normal. Sinuses/Orbits: No fluid levels or advanced mucosal thickening of the visualized paranasal sinuses. No mastoid or middle ear effusion. The orbits are normal. IMPRESSION: Normal head CT. Electronically Signed   By: Ulyses Jarred M.D.   On: 03/27/2020 02:22   CT Angio Chest PE W and/or Wo Contrast  Result Date: 03/27/2020 CLINICAL DATA:  Syncope. EXAM: CT ANGIOGRAPHY CHEST WITH CONTRAST TECHNIQUE: Multidetector CT imaging of the chest was performed using the standard protocol during bolus administration of intravenous contrast. Multiplanar CT image reconstructions and MIPs were obtained to evaluate the vascular anatomy. CONTRAST:  61mL OMNIPAQUE IOHEXOL 350 MG/ML SOLN COMPARISON:  02/03/2020 FINDINGS: Cardiovascular: Contrast injection is sufficient to demonstrate satisfactory opacification of the pulmonary arteries to the segmental level. There is no pulmonary embolus. The main pulmonary artery is within normal limits for size. The heart size is normal. Coronary artery calcifications are noted. There are mild atherosclerotic changes of the thoracic aorta without evidence for an aneurysm. Mediastinum/Nodes: --No mediastinal or hilar lymphadenopathy. --No axillary lymphadenopathy. --No supraclavicular lymphadenopathy. --Normal thyroid gland. --The esophagus is unremarkable Lungs/Pleura: Emphysematous changes are noted bilaterally. There is pleuroparenchymal scarring at the lung apices. There is no pneumothorax. No focal infiltrate. Upper Abdomen: No acute abnormality. Musculoskeletal: Again noted are multiple compression fractures throughout the thoracic spine, all essentially stable from prior study. These include fractures of the T12, T10, T8, T7, and T5 vertebral bodies. Review of the MIP images confirms the above findings. IMPRESSION: 1. No acute pulmonary embolism. 2. No acute cardiopulmonary process identified. 3. Multilevel compression fractures of thoracic spine as  detailed above, not significantly changed from prior study. Aortic Atherosclerosis (ICD10-I70.0) and Emphysema (ICD10-J43.9). Electronically Signed   By: Constance Holster M.D.   On: 03/27/2020 02:22   MR BRAIN WO CONTRAST  Result Date: 04/03/2020 CLINICAL DATA:  Ataxia. Atrial fibrillation on Eliquis. EXAM: MRI HEAD WITHOUT CONTRAST TECHNIQUE: Multiplanar, multiecho pulse sequences of the brain and surrounding structures were obtained without intravenous contrast. COMPARISON:  Head CT 03/27/2020 FINDINGS: Brain: No acute infarct, acute hemorrhage or extra-axial collection. Multifocal white matter hyperintensity, most commonly due to chronic ischemic microangiopathy. There is generalized atrophy without lobar predilection. No chronic microhemorrhage. Normal midline structures. Vascular: Normal flow voids. Skull and upper cervical spine: Normal marrow signal. Sinuses/Orbits: Negative. Other: None IMPRESSION: 1. No acute intracranial abnormality. 2. Generalized atrophy and findings of chronic ischemic microangiopathy. Electronically Signed   By: Lennette Bihari  [place] M.D.   On: 04/03/2020 19:05   DG Chest Portable 1 View  Result Date: 04/01/2020 CLINICAL DATA:  Shortness of breath.  Syncope. EXAM: PORTABLE CHEST 1 VIEW COMPARISON:  March 27, 2020 FINDINGS: Hyperinflation of the lungs. The heart, hila, mediastinum, lungs, and pleura are otherwise stable and unremarkable. IMPRESSION: Hyperinflation of the lungs suggesting COPD or emphysema. No other abnormalities. Electronically Signed   By: Dorise Bullion III M.D   On: 04/01/2020 14:18   DG Abd 2 Views  Result Date: 04/03/2020 CLINICAL DATA:  Abdominal discomfort EXAM: ABDOMEN - 2 VIEW COMPARISON:  None. FINDINGS: Two supine frontal views of the abdomen and pelvis demonstrate an unremarkable bowel gas pattern. There are no masses or abnormal calcifications. Lung bases are clear. IMPRESSION: 1. Unremarkable bowel gas pattern. Electronically Signed   By: Randa Ngo M.D.   On: 04/03/2020 19:03   EEG adult  Result Date: 04/03/2020 Lora Havens, MD     04/03/2020  3:11 PM Patient Name: Paul [name] MRN: [id number] Epilepsy Attending: Lora Havens Referring Physician/Provider: Dr. Wendi Maya Date: 04/03/2020 Duration: 22.44 mins Patient history: 74 year old male who presented with syncope.  EEG evaluate for seizures. Level of alertness: Awake AEDs during EEG study: None Technical aspects: This EEG study was done with scalp electrodes positioned according to the 10-20 International system of electrode placement. Electrical activity was acquired at a sampling rate of 500Hz  and reviewed with a high frequency filter of 70Hz  and a low frequency filter of 1Hz . EEG data were recorded continuously and digitally stored. Description: The posterior dominant rhythm consists of 7 Hz activity of moderate voltage (25-35 uV) seen predominantly in posterior head regions, symmetric and reactive to eye opening and eye closing.  Hyperventilation and photic stimulation were not performed. Abnormality -Background slow IMPRESSION: This study is suggestive of mild diffuse encephalopathy, nonspecific etiology. No seizures or epileptiform discharges were seen throughout the recording. Lora Havens   ECHOCARDIOGRAM COMPLETE  Result Date: 04/02/2020    ECHOCARDIOGRAM REPORT   Patient Name:   Paul [name] Date of Exam: 04/02/2020 Medical Rec #:  [id number]    Height:       68.0 in Accession #:    [id number]   Weight:       132.6 lb Date of Birth:  10-09-1946     BSA:          1.716 m Patient Age:    74 years     BP:           100/56 mmHg Patient Gender: M            HR:           81 bpm. Exam Location:  Inpatient Procedure: 2D Echo, Cardiac Doppler and Color Doppler Indications:    Syncope  History:        Patient has prior history of Echocardiogram examinations, most                 recent 07/09/2019. CHF, COPD, Arrythmias:Atrial Fibrillation;                 Risk Factors:Dyslipidemia  and Diabetes.  Sonographer:    Dustin Flock Referring Phys: [id number] [place]  1. Left ventricular ejection fraction, by estimation, is 60 to 65%. The left ventricle has normal function. The left ventricle has no regional wall motion abnormalities. Left ventricular diastolic parameters were normal.  2. Right ventricular systolic function is normal. The right ventricular size is  normal. There is normal pulmonary artery systolic pressure.  3. The mitral valve is normal in structure. Trivial mitral valve regurgitation. No evidence of mitral stenosis.  4. The aortic valve is tricuspid. Aortic valve regurgitation is not visualized. Mild aortic valve sclerosis is present, with no evidence of aortic valve stenosis.  5. The inferior vena cava is normal in size with greater than 50% respiratory variability, suggesting right atrial pressure of 3 mmHg. FINDINGS  Left Ventricle: Left ventricular ejection fraction, by estimation, is 60 to 65%. The left ventricle has normal function. The left ventricle has no regional wall motion abnormalities. The left ventricular internal cavity size was normal in size. There is  no left ventricular hypertrophy. Left ventricular diastolic parameters were normal. Normal left ventricular filling pressure. Right Ventricle: The right ventricular size is normal. No increase in right ventricular wall thickness. Right ventricular systolic function is normal. There is normal pulmonary artery systolic pressure. The tricuspid regurgitant velocity is 2.56 m/s, and  with an assumed right atrial pressure of 3 mmHg, the estimated right ventricular systolic pressure is [id number] mmHg. Left Atrium: Left atrial size was normal in size. Right Atrium: Right atrial size was normal in size. Pericardium: There is no evidence of pericardial effusion. Mitral Valve: The mitral valve is normal in structure. Normal mobility of the mitral valve leaflets. Trivial mitral valve regurgitation. No evidence of  mitral valve stenosis. Tricuspid Valve: The tricuspid valve is normal in structure. Tricuspid valve regurgitation is mild . No evidence of tricuspid stenosis. Aortic Valve: The aortic valve is tricuspid. Aortic valve regurgitation is not visualized. Mild aortic valve sclerosis is present, with no evidence of aortic valve stenosis. Pulmonic Valve: The pulmonic valve was normal in structure. Pulmonic valve regurgitation is not visualized. No evidence of pulmonic stenosis. Aorta: The aortic root is normal in size and structure. Venous: The inferior vena cava is normal in size with greater than 50% respiratory variability, suggesting right atrial pressure of 3 mmHg. IAS/Shunts: The interatrial septum appears to be lipomatous. No atrial level shunt detected by color flow Doppler.  LEFT VENTRICLE PLAX 2D LVIDd:         4.40 cm  Diastology LVIDs:         2.58 cm  LV e' lateral:   10.30 cm/s LV PW:         0.99 cm  LV E/e' lateral: 8.8 LV IVS:        0.92 cm  LV e' medial:    8.92 cm/s LVOT diam:     1.80 cm  LV E/e' medial:  10.2 LV SV:         63 LV SV Index:   36 LVOT Area:     2.54 cm  RIGHT VENTRICLE RV Basal diam:  2.72 cm RV S prime:     11.30 cm/s TAPSE (M-mode): 2.5 cm LEFT ATRIUM             Index       RIGHT ATRIUM           Index LA diam:        2.90 cm 1.69 cm/m  RA Area:     14.10 cm LA Vol (A2C):   42.9 ml 25.00 ml/m RA Volume:   36.10 ml  21.03 ml/m LA Vol (A4C):   55.1 ml 32.11 ml/m LA Biplane Vol: 49.5 ml 28.84 ml/m  AORTIC VALVE LVOT Vmax:   103.00 cm/s LVOT Vmean:  73.600 cm/s LVOT VTI:    0.246  m  AORTA Ao Root diam: 2.80 cm MITRAL VALVE               TRICUSPID VALVE MV Area (PHT): 4.60 cm    TV Peak grad:   22.3 mmHg MV Decel Time: 165 msec    TV Vmax:        2.36 m/s MV E velocity: 90.80 cm/s  TR Peak grad:   26.2 mmHg MV A velocity: 92.40 cm/s  TR Vmax:        256.00 cm/s MV E/A ratio:  0.98                            SHUNTS                            Systemic VTI:  0.25 m                             Systemic Diam: 1.80 cm Fransico Him MD Electronically signed by Fransico Him MD Signature Date/Time: 04/02/2020/3:51:03 PM    Final    VAS US CAROTID  Result Date: 04/04/2020 Carotid Arterial Duplex Study Indications:       Syncope. Risk Factors:      Hypertension, hyperlipidemia. Comparison Study:  no prior Performing Technologist: Abram Sander RVS  Examination Guidelines: A complete evaluation includes B-mode imaging, spectral Doppler, color Doppler, and power Doppler as needed of all accessible portions of each vessel. Bilateral testing is considered an integral part of a complete examination. Limited examinations for reoccurring indications may be performed as noted.  Right Carotid Findings: +----------+--------+--------+--------+------------------+--------+           PSV cm/sEDV cm/sStenosisPlaque DescriptionComments +----------+--------+--------+--------+------------------+--------+ CCA Prox  72      9               heterogenous               +----------+--------+--------+--------+------------------+--------+ CCA Distal80      10              heterogenous               +----------+--------+--------+--------+------------------+--------+ ICA Prox  97      21      1-39%   heterogenous               +----------+--------+--------+--------+------------------+--------+ ICA Distal100     23                                         +----------+--------+--------+--------+------------------+--------+ ECA       318                                                +----------+--------+--------+--------+------------------+--------+ +----------+--------+-------+--------+-------------------+           PSV cm/sEDV cmsDescribeArm Pressure (mmHG) +----------+--------+-------+--------+-------------------+ [id number]                                        +----------+--------+-------+--------+-------------------+ +---------+--------+--+--------+--+---------+  VertebralPSV cm/s60EDV cm/s14Antegrade +---------+--------+--+--------+--+---------+  Left Carotid Findings: +----------+--------+--------+--------+------------------+--------+  PSV cm/sEDV cm/sStenosisPlaque DescriptionComments +----------+--------+--------+--------+------------------+--------+ CCA Prox  98      19              heterogenous               +----------+--------+--------+--------+------------------+--------+ CCA Distal79      15              heterogenous               +----------+--------+--------+--------+------------------+--------+ ICA Prox  120     32      1-39%   heterogenous               +----------+--------+--------+--------+------------------+--------+ ICA Distal72      23                                         +----------+--------+--------+--------+------------------+--------+ ECA       277     10                                         +----------+--------+--------+--------+------------------+--------+ +----------+--------+--------+--------+-------------------+           PSV cm/sEDV cm/sDescribeArm Pressure (mmHG) +----------+--------+--------+--------+-------------------+ [id number]                                         +----------+--------+--------+--------+-------------------+ +---------+--------+--+--------+--+---------+ VertebralPSV cm/s95EDV cm/s22Antegrade +---------+--------+--+--------+--+---------+   Summary: Right Carotid: Velocities in the right ICA are consistent with a 1-39% stenosis. Left Carotid: Velocities in the left ICA are consistent with a 1-39% stenosis. Vertebrals: Bilateral vertebral arteries demonstrate antegrade flow. *See table(s) above for measurements and observations.  Electronically signed by Antony Contras MD on 04/04/2020 at 11:38:32 AM.    Final     Microbiology: Recent Results (from the past 240 hour(s))  Respiratory Panel by RT PCR (Flu A&B, Covid) - Nasopharyngeal Swab      Status: None   Collection Time: 04/01/20  2:37 PM   Specimen: Nasopharyngeal Swab  Result Value Ref Range Status   SARS Coronavirus 2 by RT PCR NEGATIVE NEGATIVE Final    Comment: (NOTE) SARS-CoV-2 target nucleic acids are NOT DETECTED. The SARS-CoV-2 RNA is generally detectable in upper respiratoy specimens during the acute phase of infection. The lowest concentration of SARS-CoV-2 viral copies this assay can detect is 131 copies/mL. A negative result does not preclude SARS-Cov-2 infection and should not be used as the sole basis for treatment or other patient management decisions. A negative result may occur with  improper specimen collection/handling, submission of specimen other than nasopharyngeal swab, presence of viral mutation(s) within the areas targeted by this assay, and inadequate number of viral copies (<131 copies/mL). A negative result must be combined with clinical observations, patient history, and epidemiological information. The expected result is Negative. Fact Sheet for Patients:  PinkCheek.be Fact Sheet for Healthcare Providers:  GravelBags.it This test is not yet ap proved or cleared by the [place] FDA and  has been authorized for detection and/or diagnosis of SARS-CoV-2 by FDA under an Emergency Use Authorization (EUA). This EUA will remain  in effect (meaning this test can be used) for the duration of the COVID-19 declaration under Section 564(b)(1) of the Act, 21 U.S.C. section 360bbb-3(b)(1), unless the  authorization is terminated or revoked sooner.    Influenza A by PCR NEGATIVE NEGATIVE Final   Influenza B by PCR NEGATIVE NEGATIVE Final    Comment: (NOTE) The Xpert Xpress SARS-CoV-2/FLU/RSV assay is intended as an aid in  the diagnosis of influenza from Nasopharyngeal swab specimens and  should not be used as a sole basis for treatment. Nasal washings and  aspirates are unacceptable for  Xpert Xpress SARS-CoV-2/FLU/RSV  testing. Fact Sheet for Patients: PinkCheek.be Fact Sheet for Healthcare Providers: GravelBags.it This test is not yet approved or cleared by the [place] FDA and  has been authorized for detection and/or diagnosis of SARS-CoV-2 by  FDA under an Emergency Use Authorization (EUA). This EUA will remain  in effect (meaning this test can be used) for the duration of the  Covid-19 declaration under Section 564(b)(1) of the Act, 21  U.S.C. section 360bbb-3(b)(1), unless the authorization is  terminated or revoked. Performed at [facility], [place] [address] [place], [place] [address]      Labs: Basic Metabolic Panel: Recent Labs  Lab 04/01/20 1328 04/02/20 0433 04/03/20 0907 04/06/20 0525  NA 141 143 141 142  K 4.1 3.6 3.9 2.7*  CL 97* 103 104 97*  CO2 32 32 27 36*  GLUCOSE 181* 177* 176* 96  BUN 12 10 13 9   CREATININE 0.77 0.66 0.64 0.58*  CALCIUM 8.8* 8.0* 8.0* 8.3*   Liver Function Tests: No results for input(s): AST, ALT, ALKPHOS, BILITOT, PROT, ALBUMIN in the last 168 hours. No results for input(s): LIPASE, AMYLASE in the last 168 hours. No results for input(s): AMMONIA in the last 168 hours. CBC: Recent Labs  Lab 04/01/20 1328 04/02/20 0433 04/03/20 0907 04/06/20 0525  WBC 16.7* 8.8 15.5* 9.3  NEUTROABS 15.5*  --   --   --   HGB 11.0* 9.0* 10.1* 9.7*  HCT 36.0* 29.1* 32.5* 30.7*  MCV 84.7 82.9 84.2 82.3  PLT 417* 354 382 353   Cardiac Enzymes: No results for input(s): CKTOTAL, CKMB, CKMBINDEX, TROPONINI in the last 168 hours. BNP: BNP (last 3 results) Recent Labs    02/02/20 1200 02/16/20 1726 03/04/20 1220  BNP 87.4 88.9 82.1    ProBNP (last 3 results) No results for input(s): PROBNP in the last 8760 hours.  CBG: Recent Labs  Lab 04/05/20 1058 04/05/20 1114 04/05/20 1602 04/05/20 2102 04/06/20 0602  GLUCAP 63* 86 188* 146* 89        Signed:  Florencia Reasons MD, PhD, FACP  [facility] 04/06/2020, 8:53 AM

## 2020-04-06 NOTE — Progress Notes (Signed)
CRITICAL VALUE ALERT  Critical Value:  Potassium 2.7  Date & Time Notied:  04/06/20 0705  Provider Notified: Jori Moll  Orders Received/Actions taken: Awaiting new orders

## 2020-04-06 NOTE — Telephone Encounter (Signed)
Called and spoke with [name] from [place] and stated to her the info from Dr. Melvyn Novas. [name] verbalized understanding. Nothing further needed.

## 2020-04-06 NOTE — Progress Notes (Signed)
Iv removed, pts belongings with pt. Family at bedside.  Discharge and medication education given.

## 2020-04-06 NOTE — Telephone Encounter (Signed)
Spoke with [name] from [facility]. States that pt and his wife are requesting a rolling walker with a seat to be ordered for the pt. Pt's wife would like to have this before he is discharged from the hospital.  MW - please advise if this is okay to order. Thanks.

## 2020-04-06 NOTE — Progress Notes (Signed)
Pt requires new  Peripheral iv for medication administration. Iv team consult order placed.  Ted hose removed per pt request.

## 2020-04-06 NOTE — Consult Note (Signed)
   [facility] Inpatient Consult   04/06/2020  Paul [name] 12-Oct-1946 [id number]   [facility] ACO Patient:  Medicare NextGEN review  Patient on extreme high risk 42% for unplanned readmission within the past 30 days or less and assess for [place] care/disease management services.  Patient has had 5 hospital admissions in the past 6 months. Admitted with COPD exacerbation and Syncope along with Chronic Diastolic HF.    Spoke with the patient via hospital phone, HIPAA verified regarding the benefits of [facility] services.  Explained that [facility] is a covered benefit of insurance with follow up to his primary care provider. Explained that [facility] does not interfere with or replace any services arranged by the inpatient Transition of Care [TOC] team.   Made patient aware of post hospital EMMI general discharge calls may be sent to [facility] for follow up.  He states, "I don't mind but,  I only want one call like that."  He states his cell phone is his best contact number.  Primary Care Provider: Dr. [name] and this office provides the transition of care follow up.  Patient declines offer for additional services with [facility] at this time.  Patient has home health set up.  For questions, please contact:  Natividad Brood, RN BSN [facility] Liaison  [phone] business mobile phone Toll free office [phone]  Fax number: [phone] [email or web address]

## 2020-04-06 NOTE — Telephone Encounter (Signed)
The case manager at the hospital will need to work with PT/ OT prior to discharge to provide these items and whatever else he needs based on his condition prior to d/c and are much easier to do there as they do this all the time and know what boxes to check so insurance will cover

## 2020-04-08 DIAGNOSIS — R55 Syncope and collapse: Secondary | ICD-10-CM | POA: Diagnosis not present

## 2020-04-08 DIAGNOSIS — D638 Anemia in other chronic diseases classified elsewhere: Secondary | ICD-10-CM | POA: Diagnosis not present

## 2020-04-08 DIAGNOSIS — E1169 Type 2 diabetes mellitus with other specified complication: Secondary | ICD-10-CM | POA: Diagnosis not present

## 2020-04-08 DIAGNOSIS — E876 Hypokalemia: Secondary | ICD-10-CM | POA: Diagnosis not present

## 2020-04-08 DIAGNOSIS — J9601 Acute respiratory failure with hypoxia: Secondary | ICD-10-CM | POA: Diagnosis not present

## 2020-04-08 DIAGNOSIS — Z9181 History of falling: Secondary | ICD-10-CM | POA: Diagnosis not present

## 2020-04-08 DIAGNOSIS — I5032 Chronic diastolic (congestive) heart failure: Secondary | ICD-10-CM | POA: Diagnosis not present

## 2020-04-08 DIAGNOSIS — F411 Generalized anxiety disorder: Secondary | ICD-10-CM | POA: Diagnosis not present

## 2020-04-08 DIAGNOSIS — Z87891 Personal history of nicotine dependence: Secondary | ICD-10-CM | POA: Diagnosis not present

## 2020-04-08 DIAGNOSIS — G9349 Other encephalopathy: Secondary | ICD-10-CM | POA: Diagnosis not present

## 2020-04-08 DIAGNOSIS — E1165 Type 2 diabetes mellitus with hyperglycemia: Secondary | ICD-10-CM | POA: Diagnosis not present

## 2020-04-08 DIAGNOSIS — E782 Mixed hyperlipidemia: Secondary | ICD-10-CM | POA: Diagnosis not present

## 2020-04-08 DIAGNOSIS — Z7901 Long term (current) use of anticoagulants: Secondary | ICD-10-CM | POA: Diagnosis not present

## 2020-04-08 DIAGNOSIS — J441 Chronic obstructive pulmonary disease with (acute) exacerbation: Secondary | ICD-10-CM | POA: Diagnosis not present

## 2020-04-08 DIAGNOSIS — I110 Hypertensive heart disease with heart failure: Secondary | ICD-10-CM | POA: Diagnosis not present

## 2020-04-08 DIAGNOSIS — I480 Paroxysmal atrial fibrillation: Secondary | ICD-10-CM | POA: Diagnosis not present

## 2020-04-08 DIAGNOSIS — R42 Dizziness and giddiness: Secondary | ICD-10-CM | POA: Diagnosis not present

## 2020-04-09 DIAGNOSIS — R55 Syncope and collapse: Secondary | ICD-10-CM | POA: Diagnosis not present

## 2020-04-09 DIAGNOSIS — J9601 Acute respiratory failure with hypoxia: Secondary | ICD-10-CM | POA: Diagnosis not present

## 2020-04-09 DIAGNOSIS — E876 Hypokalemia: Secondary | ICD-10-CM | POA: Diagnosis not present

## 2020-04-09 DIAGNOSIS — J441 Chronic obstructive pulmonary disease with (acute) exacerbation: Secondary | ICD-10-CM | POA: Diagnosis not present

## 2020-04-09 DIAGNOSIS — R42 Dizziness and giddiness: Secondary | ICD-10-CM | POA: Diagnosis not present

## 2020-04-09 DIAGNOSIS — E1165 Type 2 diabetes mellitus with hyperglycemia: Secondary | ICD-10-CM | POA: Diagnosis not present

## 2020-04-11 ENCOUNTER — Ambulatory Visit (INDEPENDENT_AMBULATORY_CARE_PROVIDER_SITE_OTHER): Payer: Medicare Other | Admitting: Adult Health

## 2020-04-11 ENCOUNTER — Ambulatory Visit: Payer: Medicare Other | Admitting: Adult Health

## 2020-04-11 ENCOUNTER — Encounter: Payer: Self-pay | Admitting: Adult Health

## 2020-04-11 ENCOUNTER — Other Ambulatory Visit: Payer: Self-pay

## 2020-04-11 DIAGNOSIS — J441 Chronic obstructive pulmonary disease with (acute) exacerbation: Secondary | ICD-10-CM

## 2020-04-11 DIAGNOSIS — R5381 Other malaise: Secondary | ICD-10-CM | POA: Diagnosis not present

## 2020-04-11 DIAGNOSIS — J9601 Acute respiratory failure with hypoxia: Secondary | ICD-10-CM | POA: Diagnosis not present

## 2020-04-11 DIAGNOSIS — E1165 Type 2 diabetes mellitus with hyperglycemia: Secondary | ICD-10-CM | POA: Diagnosis not present

## 2020-04-11 DIAGNOSIS — J9611 Chronic respiratory failure with hypoxia: Secondary | ICD-10-CM | POA: Diagnosis not present

## 2020-04-11 DIAGNOSIS — E876 Hypokalemia: Secondary | ICD-10-CM | POA: Diagnosis not present

## 2020-04-11 DIAGNOSIS — R55 Syncope and collapse: Secondary | ICD-10-CM | POA: Diagnosis not present

## 2020-04-11 DIAGNOSIS — R42 Dizziness and giddiness: Secondary | ICD-10-CM | POA: Diagnosis not present

## 2020-04-11 MED ORDER — PREDNISONE 5 MG PO TABS: ORAL_TABLET | ORAL | 1 refills | Status: DC

## 2020-04-11 NOTE — Patient Instructions (Addendum)
Decrease Prednisone 17.5mg  daily for 2 weeks then 15mg  daily until seen back in office.  Continue on Symbicort 2 puffs Twice daily   Continue on Spiriva daily  Increase Albuterol Neb Twice daily  .  Wear Oxygen 2l/m all the time .  Continue on Daliresp daily.  Please contact office for sooner follow up if symptoms do not improve or worsen or seek emergency care  Follow up with Dr. Melvyn Novas  Or Khylee Algeo NP in 4  weeks and As needed

## 2020-04-11 NOTE — Progress Notes (Signed)
@Patient  ID: Paul [name], male    DOB: 02/15/1946, 74 y.o.   MRN: [id number]  Chief Complaint  Patient presents with  . Follow-up    Referring provider: [name], MD  HPI: 74 year old male former smoker quit in 2010 followed for severe gold 3 COPD and chronic respiratory failure on oxygen.  Prone to recurrent COPD exacerbations Medical history significant for atrial fib  TEST/EVENTS :  05/29/16: FVC 2.08 L (86%) FEV1 0.83 L (31%) FEV1/FVC 0.40FEF 25-75 0.30 L (14%) no bronchodilator response TLC 6.56 L (107%) RV 167% ERV 160% DLCO corrected 60% (hemoglobin 10.9) 07/15/13: FVC 2.68 L (60%) FEV1 1.63 L (53%) FEV1/FVC 0.61 FEF 25-75 0.76 L (26%) 02/04/12: FVC 2.18 L (57%) FEV1 1.09 L (36%) FEV1/FVC 0.50 FEF 25-75 0.33 L (11%)   CT sinus February 2021 no acute sinus disease noted CT chest February 2021 emphysematous changes, several old thoracic compression fractures  2D echo July 2020 EF 60-65%,   04/11/2020 Follow up : COPD , O2 , [facility]  Patient returns for a post hospital follow-up.  Patient has had multiple hospitalizations recently for recurrent COPD exacerbations.  He was readmitted last week for syncope  And diarrhea.  Patient was admitted with a syncopal episode , orthostasis /diarrhea. Treated with IVF .  Syncope workup was with a normal 2D echo, EEG negative for seizures, carotid duplex negative for significant stenosis.  Was felt that his syncope was probably secondary to possible orthostasis, plus or minus hypoxia.  He did have some diarrhea while on antibiotics.  He is also on belly wraps which can contribute to GI symptoms Since discharge patient says he is feeling better.  Has felt the best he has felt in in a few weeks.  He is starting physical therapy at home.  Palliative care has consulted for him. Does feel that each time that he goes down on his prednisone dosing that he seems to have a flare in his symptoms. He is currently on prednisone 20 mg daily.   He remains on Symbicort and Spiriva.  He is on oxygen 2 L.  Does admit at times he takes his oxygen off.  Long discussion regarding potential complications of hypoxemia.   Allergies  Allergen Reactions  . Alendronate Other (See Comments)    Jittery/nervous    Immunization History  Administered Date(s) Administered  . Influenza Split 09/22/2013, 09/01/2015  . Influenza Whole 09/04/2011, 09/21/2012  . Influenza, High Dose Seasonal PF 09/02/2016, 09/10/2017, 08/31/2018, 09/24/2018, 10/21/2019  . Influenza-Unspecified 08/23/2014  . Moderna SARS-COVID-2 Vaccination 01/17/2020, 02/14/2020  . Pneumococcal Conjugate-13 01/11/2014, 12/09/2016  . Pneumococcal Polysaccharide-23 08/23/2010, 08/29/2011  . Zoster Recombinat (Shingrix) 04/04/2014    Past Medical History:  Diagnosis Date  . A-fib ([place])   . Anemia   . Atrial fibrillation ([place])   . Back pain   . COPD (chronic obstructive pulmonary disease) ([place])   . COPD (chronic obstructive pulmonary disease) ([place])   . Elevated PSA   . GAD (generalized anxiety disorder)   . High cholesterol   . Hypertension   . Impaired fasting glucose   . Osteoporosis     Tobacco History: Social History   Tobacco Use  Smoking Status Former Smoker  . Packs/day: 2.00  . Years: 52.00  . Pack years: 104.00  . Types: Cigarettes  . Quit date: 02/03/2009  . Years since quitting: 11.1  Smokeless Tobacco Never Used  Tobacco Comment   Counseled to remain smoke free   Counseling given: Not Answered Comment:  Counseled to remain smoke free   Outpatient Medications Prior to Visit  Medication Sig Dispense Refill  . acetaminophen (TYLENOL) 500 MG tablet Take 1,000 mg by mouth every 6 (six) hours as needed for mild pain or headache.    . albuterol (PROAIR HFA) 108 (90 Base) MCG/ACT inhaler Inhale 2 puffs into the lungs every 6 (six) hours as needed for wheezing or shortness of breath.     Marland Kitchen albuterol (PROVENTIL) (2.5 MG/3ML) 0.083% nebulizer solution Take  2.5 mg by nebulization in the morning and at bedtime.    Marland Kitchen apixaban (ELIQUIS) 5 MG TABS tablet Take 1 tablet (5 mg total) by mouth 2 (two) times daily. 180 tablet 3  . atorvastatin (LIPITOR) 40 MG tablet TAKE 1 TABLET(40 MG) BY MOUTH DAILY AFTER SUPPER (Patient taking differently: Take 40 mg by mouth daily at 6 PM. ) 90 tablet 3  . bisoprolol (ZEBETA) 5 MG tablet Take 5 mg by mouth daily.    . budesonide-formoterol (SYMBICORT) 160-4.5 MCG/ACT inhaler Inhale 2 puffs into the lungs 2 (two) times daily.    . Calcium 200 MG TABS Take 600 mg by mouth daily.    . cholecalciferol (VITAMIN D3) 25 MCG (1000 UNIT) tablet Take 1,000 Units by mouth daily.    Marland Kitchen denosumab (PROLIA) 60 MG/ML SOSY injection Inject 60 mg into the skin every 6 (six) months.    . diltiazem (TIAZAC) 360 MG 24 hr capsule Take 1 capsule (360 mg total) by mouth daily. 90 capsule 3  . escitalopram (LEXAPRO) 5 MG tablet Take 5 mg by mouth daily.    Marland Kitchen guaiFENesin (MUCINEX) 600 MG 12 hr tablet Take 600 mg by mouth 2 (two) times daily.    Marland Kitchen loratadine (CLARITIN) 10 MG tablet Take 10 mg by mouth daily.    Marland Kitchen LORazepam (ATIVAN) 0.5 MG tablet Take 0.5 mg by mouth 2 (two) times daily.     . Melatonin 5 MG TABS Take 10 mg by mouth at bedtime.     Marland Kitchen omeprazole (PRILOSEC) 20 MG capsule Take 20 mg by mouth in the morning and at bedtime.    . potassium chloride SA (KLOR-CON) 20 MEQ tablet Take 1 tablet (20 mEq total) by mouth daily for 14 days. 14 tablet 0  . predniSONE (DELTASONE) 10 MG tablet Take 4tabs on day one, 3tabs on day two, 2tabs on day three, 1tab on day four, then stop. 10 tablet 0  . Roflumilast (DALIRESP) 250 MCG TABS Take 250 mcg by mouth at bedtime. 28 tablet 0  . Tiotropium Bromide Monohydrate (SPIRIVA RESPIMAT) 2.5 MCG/ACT AERS Inhale 2 puffs into the lungs daily. 8 g 0  . vitamin C (ASCORBIC ACID) 250 MG tablet Take 500 mg by mouth daily.    . Roflumilast (DALIRESP) 250 MCG TABS Take 250 mcg by mouth daily.     . furosemide  (LASIX) 40 MG tablet Take 1 tablet (40 mg total) by mouth daily. 90 tablet 3   No facility-administered medications prior to visit.     Review of Systems:   Constitutional:   No  weight loss, night sweats,  Fevers, chills,  +fatigue, or  lassitude.  HEENT:   No headaches,  Difficulty swallowing,  Tooth/dental problems, or  Sore throat,                No sneezing, itching, ear ache,  +nasal congestion, post nasal drip,   CV:  No chest pain,  Orthopnea, PND, swelling in lower extremities, anasarca, dizziness, palpitations, syncope.  GI  No heartburn, indigestion, abdominal pain, nausea, vomiting, diarrhea, change in bowel habits, loss of appetite, bloody stools.   Resp:   No chest wall deformity  Skin: no rash or lesions.  GU: no dysuria, change in color of urine, no urgency or frequency.  No flank pain, no hematuria   MS:  No joint pain or swelling.  No decreased range of motion.  No back pain.    Physical Exam  BP 126/60 (BP Location: Right Arm, Cuff Size: Normal)   Pulse 86   SpO2 97%   GEN: A/Ox3; pleasant , NAD, chronically ill appearing on O2    HEENT:  [place]/AT,  , NOSE-clear, THROAT-clear, no lesions, no postnasal drip or exudate noted.   NECK:  Supple w/ fair ROM; no JVD; normal carotid impulses w/o bruits; no thyromegaly or nodules palpated; no lymphadenopathy.    RESP  Clear  P & A; w/o, wheezes/ rales/ or rhonchi. no accessory muscle use, no dullness to percussion  CARD:  RRR, no m/r/g, no peripheral edema, pulses intact, no cyanosis or clubbing.  GI:   Soft & nt; nml bowel sounds; no organomegaly or masses detected.   Musco: Warm bil, no deformities or joint swelling noted.   Neuro: alert, no focal deficits noted.    Skin: Warm, no lesions or rashes    Lab Results:   BMET   ProBNP No results found for: PROBNP  Imaging: DG Chest 2 View  Result Date: 03/27/2020 CLINICAL DATA:  Cough and shortness of breath. EXAM: CHEST - 2 VIEW COMPARISON:   03/04/2020 FINDINGS: Aortic calcifications are noted. There is no pneumothorax. No large pleural effusion. The lungs are hyperexpanded. Multilevel compression fractures are noted throughout the thoracic spine as before. There is diffuse osteopenia of the visualized osseous structures. IMPRESSION: 1. No acute cardiopulmonary process. 2. COPD. Electronically Signed   By: Constance Holster M.D.   On: 03/27/2020 00:22   CT Head Wo Contrast  Result Date: 03/27/2020 CLINICAL DATA:  Syncope EXAM: CT HEAD WITHOUT CONTRAST TECHNIQUE: Contiguous axial images were obtained from the base of the skull through the vertex without intravenous contrast. COMPARISON:  None. FINDINGS: Brain: There is no mass, hemorrhage or extra-axial collection. The size and configuration of the ventricles and extra-axial CSF spaces are normal. The brain parenchyma is normal, without acute or chronic infarction. Vascular: No abnormal hyperdensity of the major intracranial arteries or dural venous sinuses. No intracranial atherosclerosis. Skull: The visualized skull base, calvarium and extracranial soft tissues are normal. Sinuses/Orbits: No fluid levels or advanced mucosal thickening of the visualized paranasal sinuses. No mastoid or middle ear effusion. The orbits are normal. IMPRESSION: Normal head CT. Electronically Signed   By: Ulyses Jarred M.D.   On: 03/27/2020 02:22   CT Angio Chest PE W and/or Wo Contrast  Result Date: 03/27/2020 CLINICAL DATA:  Syncope. EXAM: CT ANGIOGRAPHY CHEST WITH CONTRAST TECHNIQUE: Multidetector CT imaging of the chest was performed using the standard protocol during bolus administration of intravenous contrast. Multiplanar CT image reconstructions and MIPs were obtained to evaluate the vascular anatomy. CONTRAST:  69mL OMNIPAQUE IOHEXOL 350 MG/ML SOLN COMPARISON:  02/03/2020 FINDINGS: Cardiovascular: Contrast injection is sufficient to demonstrate satisfactory opacification of the pulmonary arteries to the  segmental level. There is no pulmonary embolus. The main pulmonary artery is within normal limits for size. The heart size is normal. Coronary artery calcifications are noted. There are mild atherosclerotic changes of the thoracic aorta without evidence for an aneurysm. Mediastinum/Nodes: --No mediastinal  or hilar lymphadenopathy. --No axillary lymphadenopathy. --No supraclavicular lymphadenopathy. --Normal thyroid gland. --The esophagus is unremarkable Lungs/Pleura: Emphysematous changes are noted bilaterally. There is pleuroparenchymal scarring at the lung apices. There is no pneumothorax. No focal infiltrate. Upper Abdomen: No acute abnormality. Musculoskeletal: Again noted are multiple compression fractures throughout the thoracic spine, all essentially stable from prior study. These include fractures of the T12, T10, T8, T7, and T5 vertebral bodies. Review of the MIP images confirms the above findings. IMPRESSION: 1. No acute pulmonary embolism. 2. No acute cardiopulmonary process identified. 3. Multilevel compression fractures of thoracic spine as detailed above, not significantly changed from prior study. Aortic Atherosclerosis (ICD10-I70.0) and Emphysema (ICD10-J43.9). Electronically Signed   By: Constance Holster M.D.   On: 03/27/2020 02:22   MR BRAIN WO CONTRAST  Result Date: 04/03/2020 CLINICAL DATA:  Ataxia. Atrial fibrillation on Eliquis. EXAM: MRI HEAD WITHOUT CONTRAST TECHNIQUE: Multiplanar, multiecho pulse sequences of the brain and surrounding structures were obtained without intravenous contrast. COMPARISON:  Head CT 03/27/2020 FINDINGS: Brain: No acute infarct, acute hemorrhage or extra-axial collection. Multifocal white matter hyperintensity, most commonly due to chronic ischemic microangiopathy. There is generalized atrophy without lobar predilection. No chronic microhemorrhage. Normal midline structures. Vascular: Normal flow voids. Skull and upper cervical spine: Normal marrow signal.  Sinuses/Orbits: Negative. Other: None IMPRESSION: 1. No acute intracranial abnormality. 2. Generalized atrophy and findings of chronic ischemic microangiopathy. Electronically Signed   By: Ulyses Jarred M.D.   On: 04/03/2020 19:05   DG Chest Portable 1 View  Result Date: 04/01/2020 CLINICAL DATA:  Shortness of breath.  Syncope. EXAM: PORTABLE CHEST 1 VIEW COMPARISON:  March 27, 2020 FINDINGS: Hyperinflation of the lungs. The heart, hila, mediastinum, lungs, and pleura are otherwise stable and unremarkable. IMPRESSION: Hyperinflation of the lungs suggesting COPD or emphysema. No other abnormalities. Electronically Signed   By: Dorise Bullion III M.D   On: 04/01/2020 14:18   DG Abd 2 Views  Result Date: 04/03/2020 CLINICAL DATA:  Abdominal discomfort EXAM: ABDOMEN - 2 VIEW COMPARISON:  None. FINDINGS: Two supine frontal views of the abdomen and pelvis demonstrate an unremarkable bowel gas pattern. There are no masses or abnormal calcifications. Lung bases are clear. IMPRESSION: 1. Unremarkable bowel gas pattern. Electronically Signed   By: Randa Ngo M.D.   On: 04/03/2020 19:03   EEG adult  Result Date: 04/03/2020 Lora Havens, MD     04/03/2020  3:11 PM Patient Name: Cordale Filsinger MRN: [id number] Epilepsy Attending: Lora Havens Referring Physician/Provider: Dr. Wendi Maya Date: 04/03/2020 Duration: 22.44 mins Patient history: 74 year old male who presented with syncope.  EEG evaluate for seizures. Level of alertness: Awake AEDs during EEG study: None Technical aspects: This EEG study was done with scalp electrodes positioned according to the 10-20 International system of electrode placement. Electrical activity was acquired at a sampling rate of 500Hz  and reviewed with a high frequency filter of 70Hz  and a low frequency filter of 1Hz . EEG data were recorded continuously and digitally stored. Description: The posterior dominant rhythm consists of 7 Hz activity of moderate voltage (25-35 uV) seen  predominantly in posterior head regions, symmetric and reactive to eye opening and eye closing.  Hyperventilation and photic stimulation were not performed. Abnormality -Background slow IMPRESSION: This study is suggestive of mild diffuse encephalopathy, nonspecific etiology. No seizures or epileptiform discharges were seen throughout the recording. Lora Havens   ECHOCARDIOGRAM COMPLETE  Result Date: 04/02/2020    ECHOCARDIOGRAM REPORT   Patient Name:   [facility]  Date of Exam: 04/02/2020 Medical Rec #:  [id number]    Height:       68.0 in Accession #:    [id number]   Weight:       132.6 lb Date of Birth:  [birth year]     BSA:          1.716 m Patient Age:    12 years     BP:           100/56 mmHg Patient Gender: M            HR:           81 bpm. Exam Location:  Inpatient Procedure: 2D Echo, Cardiac Doppler and Color Doppler Indications:    Syncope  History:        Patient has prior history of Echocardiogram examinations, most                 recent 07/09/2019. CHF, COPD, Arrythmias:Atrial Fibrillation;                 Risk Factors:Dyslipidemia and Diabetes.  Sonographer:    Dustin Flock Referring Phys: [id number] [place]  1. Left ventricular ejection fraction, by estimation, is 60 to 65%. The left ventricle has normal function. The left ventricle has no regional wall motion abnormalities. Left ventricular diastolic parameters were normal.  2. Right ventricular systolic function is normal. The right ventricular size is normal. There is normal pulmonary artery systolic pressure.  3. The mitral valve is normal in structure. Trivial mitral valve regurgitation. No evidence of mitral stenosis.  4. The aortic valve is tricuspid. Aortic valve regurgitation is not visualized. Mild aortic valve sclerosis is present, with no evidence of aortic valve stenosis.  5. The inferior vena cava is normal in size with greater than 50% respiratory variability, suggesting right atrial pressure of 3 mmHg. FINDINGS   Left Ventricle: Left ventricular ejection fraction, by estimation, is 60 to 65%. The left ventricle has normal function. The left ventricle has no regional wall motion abnormalities. The left ventricular internal cavity size was normal in size. There is  no left ventricular hypertrophy. Left ventricular diastolic parameters were normal. Normal left ventricular filling pressure. Right Ventricle: The right ventricular size is normal. No increase in right ventricular wall thickness. Right ventricular systolic function is normal. There is normal pulmonary artery systolic pressure. The tricuspid regurgitant velocity is 2.56 m/s, and  with an assumed right atrial pressure of 3 mmHg, the estimated right ventricular systolic pressure is [id number] mmHg. Left Atrium: Left atrial size was normal in size. Right Atrium: Right atrial size was normal in size. Pericardium: There is no evidence of pericardial effusion. Mitral Valve: The mitral valve is normal in structure. Normal mobility of the mitral valve leaflets. Trivial mitral valve regurgitation. No evidence of mitral valve stenosis. Tricuspid Valve: The tricuspid valve is normal in structure. Tricuspid valve regurgitation is mild . No evidence of tricuspid stenosis. Aortic Valve: The aortic valve is tricuspid. Aortic valve regurgitation is not visualized. Mild aortic valve sclerosis is present, with no evidence of aortic valve stenosis. Pulmonic Valve: The pulmonic valve was normal in structure. Pulmonic valve regurgitation is not visualized. No evidence of pulmonic stenosis. Aorta: The aortic root is normal in size and structure. Venous: The inferior vena cava is normal in size with greater than 50% respiratory variability, suggesting right atrial pressure of 3 mmHg. IAS/Shunts: The interatrial septum appears to be lipomatous. No atrial level shunt detected by color flow Doppler.  LEFT VENTRICLE PLAX 2D LVIDd:         4.40 cm  Diastology LVIDs:         2.58 cm  LV e' lateral:    10.30 cm/s LV PW:         0.99 cm  LV E/e' lateral: 8.8 LV IVS:        0.92 cm  LV e' medial:    8.92 cm/s LVOT diam:     1.80 cm  LV E/e' medial:  10.2 LV SV:         63 LV SV Index:   36 LVOT Area:     2.54 cm  RIGHT VENTRICLE RV Basal diam:  2.72 cm RV S prime:     11.30 cm/s TAPSE (M-mode): 2.5 cm LEFT ATRIUM             Index       RIGHT ATRIUM           Index LA diam:        2.90 cm 1.69 cm/m  RA Area:     14.10 cm LA Vol (A2C):   42.9 ml 25.00 ml/m RA Volume:   36.10 ml  21.03 ml/m LA Vol (A4C):   55.1 ml 32.11 ml/m LA Biplane Vol: 49.5 ml 28.84 ml/m  AORTIC VALVE LVOT Vmax:   103.00 cm/s LVOT Vmean:  73.600 cm/s LVOT VTI:    0.246 m  AORTA Ao Root diam: 2.80 cm MITRAL VALVE               TRICUSPID VALVE MV Area (PHT): 4.60 cm    TV Peak grad:   22.3 mmHg MV Decel Time: 165 msec    TV Vmax:        2.36 m/s MV E velocity: 90.80 cm/s  TR Peak grad:   26.2 mmHg MV A velocity: 92.40 cm/s  TR Vmax:        256.00 cm/s MV E/A ratio:  0.98                            SHUNTS                            Systemic VTI:  0.25 m                            Systemic Diam: 1.80 cm Fransico Him MD Electronically signed by Fransico Him MD Signature Date/Time: 04/02/2020/3:51:03 PM    Final    VAS US CAROTID  Result Date: 04/04/2020 Carotid Arterial Duplex Study Indications:       Syncope. Risk Factors:      Hypertension, hyperlipidemia. Comparison Study:  no prior Performing Technologist: Abram Sander RVS  Examination Guidelines: A complete evaluation includes B-mode imaging, spectral Doppler, color Doppler, and power Doppler as needed of all accessible portions of each vessel. Bilateral testing is considered an integral part of a complete examination. Limited examinations for reoccurring indications may be performed as noted.  Right Carotid Findings: +----------+--------+--------+--------+------------------+--------+           PSV cm/sEDV cm/sStenosisPlaque DescriptionComments  +----------+--------+--------+--------+------------------+--------+ CCA Prox  72      9               heterogenous               +----------+--------+--------+--------+------------------+--------+ CCA Distal80  10              heterogenous               +----------+--------+--------+--------+------------------+--------+ ICA Prox  97      21      1-39%   heterogenous               +----------+--------+--------+--------+------------------+--------+ ICA Distal100     23                                         +----------+--------+--------+--------+------------------+--------+ ECA       318                                                +----------+--------+--------+--------+------------------+--------+ +----------+--------+-------+--------+-------------------+           PSV cm/sEDV cmsDescribeArm Pressure (mmHG) +----------+--------+-------+--------+-------------------+ [id number]                                        +----------+--------+-------+--------+-------------------+ +---------+--------+--+--------+--+---------+ VertebralPSV cm/s60EDV cm/s14Antegrade +---------+--------+--+--------+--+---------+  Left Carotid Findings: +----------+--------+--------+--------+------------------+--------+           PSV cm/sEDV cm/sStenosisPlaque DescriptionComments +----------+--------+--------+--------+------------------+--------+ CCA Prox  98      19              heterogenous               +----------+--------+--------+--------+------------------+--------+ CCA Distal79      15              heterogenous               +----------+--------+--------+--------+------------------+--------+ ICA Prox  120     32      1-39%   heterogenous               +----------+--------+--------+--------+------------------+--------+ ICA Distal72      23                                          +----------+--------+--------+--------+------------------+--------+ ECA       277     10                                         +----------+--------+--------+--------+------------------+--------+ +----------+--------+--------+--------+-------------------+           PSV cm/sEDV cm/sDescribeArm Pressure (mmHG) +----------+--------+--------+--------+-------------------+ [id number]                                         +----------+--------+--------+--------+-------------------+ +---------+--------+--+--------+--+---------+ VertebralPSV cm/s95EDV cm/s22Antegrade +---------+--------+--+--------+--+---------+   Summary: Right Carotid: Velocities in the right ICA are consistent with a 1-39% stenosis. Left Carotid: Velocities in the left ICA are consistent with a 1-39% stenosis. Vertebrals: Bilateral vertebral arteries demonstrate antegrade flow. *See table(s) above for measurements and observations.  Electronically signed by Antony Contras MD on 04/04/2020 at 11:38:32 AM.    Final     denosumab (PROLIA) injection 60 mg    Date  Action Dose Route User   Discharged on 04/06/2020   Admitted on 04/01/2020   Discharged on 03/27/2020   Admitted on 03/26/2020   Discharged on 03/09/2020   Admitted on 03/04/2020   03/02/2020 1447 Given 60 mg Subcutaneous (Left Arm) Effie Shy, RN      PFT Results Latest Ref Rng & Units 05/29/2016  FVC-Pre L 1.92  FVC-Predicted Pre % 52  Pre FEV1/FVC % % 41  FEV1-Pre L 0.78  FEV1-Predicted Pre % 29    No results found for: NITRICOXIDE      Assessment & Plan:   No problem-specific Assessment & Plan notes found for this encounter.     Rexene Edison, NP 04/11/2020

## 2020-04-12 DIAGNOSIS — J9601 Acute respiratory failure with hypoxia: Secondary | ICD-10-CM | POA: Diagnosis not present

## 2020-04-12 DIAGNOSIS — E1165 Type 2 diabetes mellitus with hyperglycemia: Secondary | ICD-10-CM | POA: Diagnosis not present

## 2020-04-12 DIAGNOSIS — R55 Syncope and collapse: Secondary | ICD-10-CM | POA: Diagnosis not present

## 2020-04-12 DIAGNOSIS — R5381 Other malaise: Secondary | ICD-10-CM | POA: Insufficient documentation

## 2020-04-12 DIAGNOSIS — E876 Hypokalemia: Secondary | ICD-10-CM | POA: Diagnosis not present

## 2020-04-12 DIAGNOSIS — J441 Chronic obstructive pulmonary disease with (acute) exacerbation: Secondary | ICD-10-CM | POA: Diagnosis not present

## 2020-04-12 DIAGNOSIS — R42 Dizziness and giddiness: Secondary | ICD-10-CM | POA: Diagnosis not present

## 2020-04-12 NOTE — Assessment & Plan Note (Signed)
Slowly resolving flare - prone to recurrent exacerbations -  Agree with palliative care .  Continue Daliresp for now , if diarrhea returns consider d/c .  Will continue chronic steroids but taper very slowly and gradually  Add scheduled BD nebs for now   Plan  Patient Instructions  Decrease Prednisone 17.5mg  daily for 2 weeks then 15mg  daily until seen back in office.  Continue on Symbicort 2 puffs Twice daily   Continue on Spiriva daily  Increase Albuterol Neb Twice daily  .  Wear Oxygen 2l/m all the time .  Continue on Daliresp daily.  Please contact office for sooner follow up if symptoms do not improve or worsen or seek emergency care  Follow up with Dr. Melvyn Novas  Or Arilynn Blakeney NP in 4  weeks and As needed

## 2020-04-12 NOTE — Assessment & Plan Note (Signed)
Encouraged On O2 use. O2 sats goal >88-90%.   Plan  Patient Instructions  Decrease Prednisone 17.5mg  daily for 2 weeks then 15mg  daily until seen back in office.  Continue on Symbicort 2 puffs Twice daily   Continue on Spiriva daily  Increase Albuterol Neb Twice daily  .  Wear Oxygen 2l/m all the time .  Continue on Daliresp daily.  Please contact office for sooner follow up if symptoms do not improve or worsen or seek emergency care  Follow up with Dr. Melvyn Novas  Or Shalunda Lindh NP in 4  weeks and As needed

## 2020-04-12 NOTE — Assessment & Plan Note (Signed)
Continue with PT at home .  High protein diet .

## 2020-04-13 ENCOUNTER — Telehealth: Payer: Self-pay

## 2020-04-13 NOTE — Telephone Encounter (Signed)
Paul [name] called to report that Paul [name] is experiencing continued weight loss and diarrhea with the daliresp.  Dr. Tobie Poet advised that he stop the daliresp and recheck in the office next week as scheduled.

## 2020-04-14 DIAGNOSIS — E1165 Type 2 diabetes mellitus with hyperglycemia: Secondary | ICD-10-CM | POA: Diagnosis not present

## 2020-04-14 DIAGNOSIS — J9601 Acute respiratory failure with hypoxia: Secondary | ICD-10-CM | POA: Diagnosis not present

## 2020-04-14 DIAGNOSIS — R55 Syncope and collapse: Secondary | ICD-10-CM | POA: Diagnosis not present

## 2020-04-14 DIAGNOSIS — R42 Dizziness and giddiness: Secondary | ICD-10-CM | POA: Diagnosis not present

## 2020-04-14 DIAGNOSIS — J441 Chronic obstructive pulmonary disease with (acute) exacerbation: Secondary | ICD-10-CM | POA: Diagnosis not present

## 2020-04-14 DIAGNOSIS — E876 Hypokalemia: Secondary | ICD-10-CM | POA: Diagnosis not present

## 2020-04-17 DIAGNOSIS — J9601 Acute respiratory failure with hypoxia: Secondary | ICD-10-CM | POA: Diagnosis not present

## 2020-04-17 DIAGNOSIS — E1165 Type 2 diabetes mellitus with hyperglycemia: Secondary | ICD-10-CM | POA: Diagnosis not present

## 2020-04-17 DIAGNOSIS — R42 Dizziness and giddiness: Secondary | ICD-10-CM | POA: Diagnosis not present

## 2020-04-17 DIAGNOSIS — R55 Syncope and collapse: Secondary | ICD-10-CM | POA: Diagnosis not present

## 2020-04-17 DIAGNOSIS — J441 Chronic obstructive pulmonary disease with (acute) exacerbation: Secondary | ICD-10-CM | POA: Diagnosis not present

## 2020-04-17 DIAGNOSIS — E876 Hypokalemia: Secondary | ICD-10-CM | POA: Diagnosis not present

## 2020-04-18 NOTE — Progress Notes (Signed)
Subjective:  Patient ID: Paul [name], male    DOB: [birth year]  Age: 74 y.o. MRN: [id number]  Chief Complaint  Patient presents with  . Hospitalization Follow-up  . COPD  . Congestive Heart Failure  . hypoxia  . Diabetes  . Hyperlipidemia  . Atrial Fibrillation    HPI   Patient is a 74 year old white male with severe COPD, paroxysmal atrial fibrillation, diastolic heart failure and diabetes, who has had numerous COPD exacerbations in the last 6 months.  Patient has been admitted or to the emergency department 5 times.  His most recent admission was from 04/01/20-04/06/20 and was due to nausea and vomiting, dehydration, and syncope.  I had started the patient on Daliresp in the hopes of decreasing his admissions for COPD and it appears he may have had a reaction to this medicine.  It has been discontinued he is doing much better now. In terms of testing he was negative for influenza and Covid.  His chest x-ray showed hyperinflation of lungs consistent with COPD.  No pneumonia was present.  However he was significantly compromised because even with mild ambulation he became short of breath and orthostatic.  The patient has acute on chronic respiratory failure with hypoxia. He has been slowly improving with prednisone in addition to his regular pulmonary medications.  Daliresp was discontinued and syncope and orthostasis improved.  He does have a follow-up with his pulmonologist Paul [name].  Echocardiogram was normal and he had no irregular rhythms during his hospitalization.  Carotid Dopplers were done which showed no stenosis.  An EEG was done which showed no seizure activity.  They did recommend considering an event monitor but at this time his symptoms seem to have fully resolved with discontinuation of the [place].His atrial fibrillation is well controlled on bisoprolol, Cardizem and Eliquis for anticoagulation.  His CHA2DS2-VASc score greater than 5. His chronic diastolic heart failure is  compensated at this time. He was discharged on his home dose of Lasix. He was hypokalemic on admission which resolved with potassium replacement.  The patient had very stable prediabetes until the last year during which he has had to be on so much prednisone.  His most recent A1c was  7.3 and this was without medication.  Paul [name] also has anemia of chronic disease however his hemoglobin has been stable around 10.    Past Medical History:  Diagnosis Date  . A-fib ([place])   . Anemia   . Atrial fibrillation ([employer])   . Back pain   . COPD (chronic obstructive pulmonary disease) ([place])   . COPD (chronic obstructive pulmonary disease) ([place])   . Elevated PSA   . GAD (generalized anxiety disorder)   . High cholesterol   . Hypertension   . Impaired fasting glucose   . Osteoporosis    Past Surgical History:  Procedure Laterality Date  . CATARACT EXTRACTION Right   . SKIN SURGERY     shoulders and chest    Family History  Problem Relation Age of Onset  . Heart disease Brother   . Heart disease Mother   . Heart disease Father   . Cancer Paternal Uncle    Social History   Socioeconomic History  . Marital status: Married    Spouse name: Not on file  . Number of children: 3  . Years of education: Not on file  . Highest education level: Not on file  Occupational History  . Occupation: retired    Comment: truck Geophysicist/field seismologist  Tobacco Use  .  Smoking status: Former Smoker    Packs/day: 2.00    Years: 52.00    Pack years: 104.00    Types: Cigarettes    Quit date: 02/03/2009    Years since quitting: 11.2  . Smokeless tobacco: Never Used  . Tobacco comment: Counseled to remain smoke free  Substance and Sexual Activity  . Alcohol use: Yes    Alcohol/week: 0.0 standard drinks    Comment: occ  . Drug use: No  . Sexual activity: Not on file  Other Topics Concern  . Not on file  Social History Narrative   Originally from [place]. Previously worked driving a Restaurant manager, fast food. No pets currently. No  mold exposure. 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       wears sunscreen, brushes and flosses daily, see's dentist bi-annually, has smoke/carbon monoxide detectors, wears a seatbelt and practices gun safety      Social Determinants of Health   Financial Resource Strain:   . Difficulty of Paying Living Expenses:   Food Insecurity:   . Worried About Charity fundraiser in the Last Year:   . Arboriculturist in the Last Year:   Transportation Needs:   . Film/video editor (Medical):   Marland Kitchen Lack of Transportation (Non-Medical):   Physical Activity: Inactive  . Days of Exercise per Week: 0 days  . Minutes of Exercise per Session: 0 min  Stress:   . Feeling of Stress :   Social Connections:   . Frequency of Communication with Friends and Family:   . Frequency of Social Gatherings with Friends and Family:   . Attends Religious Services:   . Active Member of Clubs or Organizations:   . Attends Archivist Meetings:   Marland Kitchen Marital Status:     Review of Systems  Constitutional: Negative for chills, diaphoresis, fatigue and fever.  HENT: Negative for congestion, ear pain and sore throat.   Respiratory: Negative for cough and shortness of breath.   Cardiovascular: Negative for chest pain and leg swelling.  Gastrointestinal: Negative for abdominal pain, constipation, diarrhea, nausea and vomiting.  Genitourinary: Negative for dysuria and urgency.  Musculoskeletal: Negative for arthralgias and myalgias.  Neurological: Negative for dizziness and headaches.  Psychiatric/Behavioral: Negative for dysphoric mood.     Objective:  BP (!) 116/58   Pulse 89   Temp 98.2 F (36.8 C)   Resp 20   Ht 5\' 6"  (1.676 m)   Wt 131 lb 6.4 oz (59.6 kg)   BMI 21.21 kg/m   BP/Weight 04/20/2020 04/11/2020 [id number]  Systolic BP [id number]  Diastolic BP 58 60 55  Wt. (Lbs) 131.4 - 133.3  BMI 21.21 - 20.27    Physical  Exam Constitutional:      Appearance: Normal appearance.  Cardiovascular:     Rate and Rhythm: Normal rate and regular rhythm.     Heart sounds: Normal heart sounds.  Pulmonary:     Effort: Pulmonary effort is normal.     Breath sounds: Normal breath sounds.     Comments: Wearing oxygen. Abdominal:     General: Abdomen is flat. Bowel sounds are normal. There is no distension.     Palpations: Abdomen is soft.     Tenderness: There is no abdominal tenderness.  Skin:    General: Skin is warm and dry.  Neurological:     Mental Status: He is alert and oriented to person,  place, and time.  Psychiatric:        Mood and Affect: Mood normal.        Behavior: Behavior normal.     Lab Results  Component Value Date   WBC 9.3 04/06/2020   HGB 9.7 (L) 04/06/2020   HCT 30.7 (L) 04/06/2020   PLT 353 04/06/2020   GLUCOSE 96 04/06/2020   ALT 31 02/04/2020   AST 15 02/04/2020   NA 142 04/06/2020   K 2.7 (LL) 04/06/2020   CL 97 (L) 04/06/2020   CREATININE 0.58 (L) 04/06/2020   BUN 9 04/06/2020   CO2 36 (H) 04/06/2020   TSH 2.539 03/22/2019   INR 1.1 04/01/2020   HGBA1C 7.3 (H) 03/04/2020      Assessment & Plan:  1. Hypokalemia - Comprehensive metabolic panel  2. Combined hyperlipidemia associated with type 2 diabetes mellitus ([place]) The current medical regimen is effective;  continue present plan and medications.  3. Chronic respiratory failure with hypoxia (HCC) Mgmt per pulmonology. Continue Oxygen pnc.  4. COPD GOLD  III  Management by pulmonology.  Continue current inhalers.  Samples given today.  Remain off [place].    5. Syncope and collapse Resolved.  Seems to have been due to dehydration.  6. Dehydration Resolved.  No longer having diarrhea, nausea or vomiting.  7. Chronic atrial fibrillation (HCC) The current medical regimen is effective;  continue present plan and medications.  Orders Placed This Encounter  Procedures  . Comprehensive metabolic panel      Follow-up: Return in about 3 months (around 07/20/2020) for fasting.Marland Kitchen  An After Visit Summary was printed and given to the patient.  [name] [facility] [phone]

## 2020-04-19 ENCOUNTER — Inpatient Hospital Stay: Payer: Medicare Other | Admitting: Family Medicine

## 2020-04-20 ENCOUNTER — Encounter: Payer: Self-pay | Admitting: Family Medicine

## 2020-04-20 ENCOUNTER — Ambulatory Visit (INDEPENDENT_AMBULATORY_CARE_PROVIDER_SITE_OTHER): Payer: Medicare Other | Admitting: Family Medicine

## 2020-04-20 ENCOUNTER — Other Ambulatory Visit: Payer: Self-pay

## 2020-04-20 VITALS — BP 116/58 | HR 89 | Temp 98.2°F | Resp 20 | Ht 66.0 in | Wt 131.4 lb

## 2020-04-20 DIAGNOSIS — E1169 Type 2 diabetes mellitus with other specified complication: Secondary | ICD-10-CM

## 2020-04-20 DIAGNOSIS — J9611 Chronic respiratory failure with hypoxia: Secondary | ICD-10-CM

## 2020-04-20 DIAGNOSIS — E860 Dehydration: Secondary | ICD-10-CM

## 2020-04-20 DIAGNOSIS — J449 Chronic obstructive pulmonary disease, unspecified: Secondary | ICD-10-CM | POA: Diagnosis not present

## 2020-04-20 DIAGNOSIS — E876 Hypokalemia: Secondary | ICD-10-CM

## 2020-04-20 DIAGNOSIS — E782 Mixed hyperlipidemia: Secondary | ICD-10-CM | POA: Diagnosis not present

## 2020-04-20 DIAGNOSIS — R55 Syncope and collapse: Secondary | ICD-10-CM

## 2020-04-20 DIAGNOSIS — E1165 Type 2 diabetes mellitus with hyperglycemia: Secondary | ICD-10-CM

## 2020-04-20 DIAGNOSIS — J9601 Acute respiratory failure with hypoxia: Secondary | ICD-10-CM | POA: Diagnosis not present

## 2020-04-20 DIAGNOSIS — R42 Dizziness and giddiness: Secondary | ICD-10-CM

## 2020-04-20 DIAGNOSIS — G9349 Other encephalopathy: Secondary | ICD-10-CM

## 2020-04-20 DIAGNOSIS — I5032 Chronic diastolic (congestive) heart failure: Secondary | ICD-10-CM

## 2020-04-20 DIAGNOSIS — I110 Hypertensive heart disease with heart failure: Secondary | ICD-10-CM

## 2020-04-20 DIAGNOSIS — J441 Chronic obstructive pulmonary disease with (acute) exacerbation: Secondary | ICD-10-CM | POA: Diagnosis not present

## 2020-04-20 DIAGNOSIS — I482 Chronic atrial fibrillation, unspecified: Secondary | ICD-10-CM

## 2020-04-20 NOTE — Patient Instructions (Addendum)
Keep eating! We need you to gain weight! Refer to pharmacist, Sherre Poot, PHD.

## 2020-04-21 ENCOUNTER — Other Ambulatory Visit: Payer: Self-pay

## 2020-04-21 DIAGNOSIS — R42 Dizziness and giddiness: Secondary | ICD-10-CM | POA: Diagnosis not present

## 2020-04-21 DIAGNOSIS — J9601 Acute respiratory failure with hypoxia: Secondary | ICD-10-CM | POA: Diagnosis not present

## 2020-04-21 DIAGNOSIS — J441 Chronic obstructive pulmonary disease with (acute) exacerbation: Secondary | ICD-10-CM | POA: Diagnosis not present

## 2020-04-21 DIAGNOSIS — R55 Syncope and collapse: Secondary | ICD-10-CM | POA: Diagnosis not present

## 2020-04-21 DIAGNOSIS — E876 Hypokalemia: Secondary | ICD-10-CM | POA: Diagnosis not present

## 2020-04-21 DIAGNOSIS — E1165 Type 2 diabetes mellitus with hyperglycemia: Secondary | ICD-10-CM | POA: Diagnosis not present

## 2020-04-21 LAB — COMPREHENSIVE METABOLIC PANEL
ALT: 26 IU/L (ref 0–44)
AST: 17 IU/L (ref 0–40)
Albumin/Globulin Ratio: 1.5 (ref 1.2–2.2)
Albumin: 3.9 g/dL (ref 3.7–4.7)
Alkaline Phosphatase: 91 IU/L (ref 39–117)
BUN/Creatinine Ratio: 24 (ref 10–24)
BUN: 19 mg/dL (ref 8–27)
Bilirubin Total: 0.3 mg/dL (ref 0.0–1.2)
CO2: 26 mmol/L (ref 20–29)
Calcium: 9.3 mg/dL (ref 8.6–10.2)
Chloride: 98 mmol/L (ref 96–106)
Creatinine, Ser: 0.79 mg/dL (ref 0.76–1.27)
GFR calc Af Amer: 102 mL/min/{1.73_m2} (ref >59)
GFR calc non Af Amer: 88 mL/min/{1.73_m2} (ref >59)
Globulin, Total: 2.6 g/dL (ref 1.5–4.5)
Glucose: 168 mg/dL — ABNORMAL HIGH (ref 65–99)
Potassium: 5.2 mmol/L (ref 3.5–5.2)
Sodium: 140 mmol/L (ref 134–144)
Total Protein: 6.5 g/dL (ref 6.0–8.5)

## 2020-04-24 ENCOUNTER — Other Ambulatory Visit: Payer: Medicare Other

## 2020-04-24 DIAGNOSIS — R55 Syncope and collapse: Secondary | ICD-10-CM | POA: Diagnosis not present

## 2020-04-24 DIAGNOSIS — J441 Chronic obstructive pulmonary disease with (acute) exacerbation: Secondary | ICD-10-CM | POA: Diagnosis not present

## 2020-04-24 DIAGNOSIS — E876 Hypokalemia: Secondary | ICD-10-CM | POA: Diagnosis not present

## 2020-04-24 DIAGNOSIS — J9601 Acute respiratory failure with hypoxia: Secondary | ICD-10-CM | POA: Diagnosis not present

## 2020-04-24 DIAGNOSIS — R42 Dizziness and giddiness: Secondary | ICD-10-CM | POA: Diagnosis not present

## 2020-04-24 DIAGNOSIS — E1165 Type 2 diabetes mellitus with hyperglycemia: Secondary | ICD-10-CM | POA: Diagnosis not present

## 2020-04-26 ENCOUNTER — Ambulatory Visit: Payer: Medicare Other | Admitting: Family Medicine

## 2020-04-26 DIAGNOSIS — E876 Hypokalemia: Secondary | ICD-10-CM | POA: Diagnosis not present

## 2020-04-26 DIAGNOSIS — R42 Dizziness and giddiness: Secondary | ICD-10-CM | POA: Diagnosis not present

## 2020-04-26 DIAGNOSIS — J9601 Acute respiratory failure with hypoxia: Secondary | ICD-10-CM | POA: Diagnosis not present

## 2020-04-26 DIAGNOSIS — J441 Chronic obstructive pulmonary disease with (acute) exacerbation: Secondary | ICD-10-CM | POA: Diagnosis not present

## 2020-04-26 DIAGNOSIS — E1165 Type 2 diabetes mellitus with hyperglycemia: Secondary | ICD-10-CM | POA: Diagnosis not present

## 2020-04-26 DIAGNOSIS — R55 Syncope and collapse: Secondary | ICD-10-CM | POA: Diagnosis not present

## 2020-04-28 DIAGNOSIS — E876 Hypokalemia: Secondary | ICD-10-CM | POA: Diagnosis not present

## 2020-04-28 DIAGNOSIS — J9601 Acute respiratory failure with hypoxia: Secondary | ICD-10-CM | POA: Diagnosis not present

## 2020-04-28 DIAGNOSIS — J441 Chronic obstructive pulmonary disease with (acute) exacerbation: Secondary | ICD-10-CM | POA: Diagnosis not present

## 2020-04-28 DIAGNOSIS — R55 Syncope and collapse: Secondary | ICD-10-CM | POA: Diagnosis not present

## 2020-04-28 DIAGNOSIS — R42 Dizziness and giddiness: Secondary | ICD-10-CM | POA: Diagnosis not present

## 2020-04-28 DIAGNOSIS — E1165 Type 2 diabetes mellitus with hyperglycemia: Secondary | ICD-10-CM | POA: Diagnosis not present

## 2020-05-01 ENCOUNTER — Telehealth: Payer: Self-pay | Admitting: Adult Health

## 2020-05-01 DIAGNOSIS — R42 Dizziness and giddiness: Secondary | ICD-10-CM | POA: Diagnosis not present

## 2020-05-01 DIAGNOSIS — E876 Hypokalemia: Secondary | ICD-10-CM | POA: Diagnosis not present

## 2020-05-01 DIAGNOSIS — J9601 Acute respiratory failure with hypoxia: Secondary | ICD-10-CM | POA: Diagnosis not present

## 2020-05-01 DIAGNOSIS — J441 Chronic obstructive pulmonary disease with (acute) exacerbation: Secondary | ICD-10-CM | POA: Diagnosis not present

## 2020-05-01 DIAGNOSIS — E1165 Type 2 diabetes mellitus with hyperglycemia: Secondary | ICD-10-CM | POA: Diagnosis not present

## 2020-05-01 DIAGNOSIS — R55 Syncope and collapse: Secondary | ICD-10-CM | POA: Diagnosis not present

## 2020-05-01 MED ORDER — DOXYCYCLINE HYCLATE 100 MG PO TABS: 100.0000 mg | ORAL_TABLET | Freq: Two times a day (BID) | ORAL | 0 refills | Status: DC

## 2020-05-01 NOTE — Telephone Encounter (Signed)
Low grade fever , increased cough/congestion with yellow mucus x2-3 days .  Eating and drinking . No nv/d.  No chest pain , edema or hemoptysis . No loss of taste or smell  Has had both covid vaccines.   O2 sats drop to 84% walking on O2 at 2l/m , >90% on 3l/m .   Advised him to keep oxygen on at 2 L at rest and use 3 L with walking and activity.  Will call in doxycycline to pharmacy for it for 1 week.  Continue on prednisone at 15 mg.  Take Mucinex DM twice daily as needed for cough congestion.  Albuterol as needed.  If symptoms are not improving or worsen patient is to call back sooner or go to the emergency room.  Patient is to keep his follow-up in 1 week and as needed  Please contact office for sooner follow up if symptoms do not improve or worsen or seek emergency care

## 2020-05-03 ENCOUNTER — Encounter: Payer: Self-pay | Admitting: Cardiology

## 2020-05-03 ENCOUNTER — Telehealth (INDEPENDENT_AMBULATORY_CARE_PROVIDER_SITE_OTHER): Payer: Medicare Other | Admitting: Cardiology

## 2020-05-03 ENCOUNTER — Other Ambulatory Visit: Payer: Self-pay

## 2020-05-03 VITALS — BP 113/56 | HR 91 | Temp 98.4°F | Ht 66.0 in | Wt 133.0 lb

## 2020-05-03 DIAGNOSIS — E876 Hypokalemia: Secondary | ICD-10-CM | POA: Diagnosis not present

## 2020-05-03 DIAGNOSIS — I480 Paroxysmal atrial fibrillation: Secondary | ICD-10-CM | POA: Diagnosis not present

## 2020-05-03 DIAGNOSIS — J9601 Acute respiratory failure with hypoxia: Secondary | ICD-10-CM | POA: Diagnosis not present

## 2020-05-03 DIAGNOSIS — J441 Chronic obstructive pulmonary disease with (acute) exacerbation: Secondary | ICD-10-CM | POA: Diagnosis not present

## 2020-05-03 DIAGNOSIS — Z7901 Long term (current) use of anticoagulants: Secondary | ICD-10-CM

## 2020-05-03 DIAGNOSIS — R55 Syncope and collapse: Secondary | ICD-10-CM | POA: Diagnosis not present

## 2020-05-03 DIAGNOSIS — R42 Dizziness and giddiness: Secondary | ICD-10-CM | POA: Diagnosis not present

## 2020-05-03 DIAGNOSIS — E1165 Type 2 diabetes mellitus with hyperglycemia: Secondary | ICD-10-CM | POA: Diagnosis not present

## 2020-05-03 DIAGNOSIS — I950 Idiopathic hypotension: Secondary | ICD-10-CM

## 2020-05-03 MED ORDER — DILTIAZEM HCL ER COATED BEADS 180 MG PO CP24: 180.0000 mg | ORAL_CAPSULE | Freq: Every day | ORAL | 3 refills | Status: DC

## 2020-05-03 NOTE — Patient Instructions (Addendum)
Medication Instructions:  Your physician has recommended you make the following change in your medication:  1-STOP diltiazem 360 mg 2-START diltiazem 180 mg by mouth daily.  *If you need a refill on your cardiac medications before your next appointment, please call your pharmacy*   Lab Work: If you have labs (blood work) drawn today and your tests are completely normal, you will receive your results only by: Marland Kitchen MyChart Message (if you have MyChart) OR . A paper copy in the mail If you have any lab test that is abnormal or we need to change your treatment, we will call you to review the results.  Follow-Up: At [facility], you and your health needs are our priority.  As part of our continuing mission to provide you with exceptional heart care, we have created designated Provider Care Teams.  These Care Teams include your primary Cardiologist (physician) and Advanced Practice Providers (APPs -  Physician Assistants and Nurse Practitioners) who all work together to provide you with the care you need, when you need it.  We recommend signing up for the patient portal called "MyChart".  Sign up information is provided on this After Visit Summary.  MyChart is used to connect with patients for Virtual Visits (Telemedicine).  Patients are able to view lab/test results, encounter notes, upcoming appointments, etc.  Non-urgent messages can be sent to your provider as well.   To learn more about what you can do with MyChart, go to [email or web address].    Your next appointment:   3 week(s)  The format for your next appointment:   Virtual Visit   Provider:   You may see Candee Furbish, MD or one of the following Advanced Practice Providers on your designated Care Team:    Truitt Merle, NP  Cecilie Kicks, NP  Kathyrn Drown, NP

## 2020-05-03 NOTE — Progress Notes (Signed)
Virtual Visit via Video Note   This visit type was conducted due to national recommendations for restrictions regarding the COVID-19 Pandemic (e.g. social distancing) in an effort to limit this patient's exposure and mitigate transmission in our community.  Due to his co-morbid illnesses, this patient is at least at moderate risk for complications without adequate follow up.  This format is felt to be most appropriate for this patient at this time.  All issues noted in this document were discussed and addressed.  A limited physical exam was performed with this format.  Please refer to the patient's chart for his consent to telehealth for [facility].   The patient was identified using 2 identifiers.  Date:  05/03/2020   ID:  Paul [name], DOB 09/04/1946, MRN [id number]  Patient Location: Home Provider Location: Office  PCP:  [name], MD  Cardiologist:  Candee Furbish, MD  Electrophysiologist:  None   Evaluation Performed:  Follow-Up Visit  Chief Complaint: Low blood pressures  History of Present Illness:    Paul [name] is a 74 y.o. male with COPD, approximately 6 hospitalizations in the recent past secondary to COPD with atrial fibrillation who recently at home has been experiencing low blood pressures.  Previously checked was 100/60.  I saw his wife, [name] yesterday who noted this.  He was last seen by his primary provider on 04/20/2020 where blood pressure at that visit was 116/58, prior to that 126/60, prior to that 129/55.  Phone notes from 04/13/2020 demonstrated continued weight loss and diarrhea with Daliresp.  This was stopped.  The patient does not have symptoms concerning for COVID-19 infection (fever, chills, cough, or new shortness of breath).    Past Medical History:  Diagnosis Date  . A-fib ([place])   . Anemia   . Atrial fibrillation ([place])   . Back pain   . COPD (chronic obstructive pulmonary disease) ([place])   . COPD (chronic obstructive pulmonary disease) ([place])    . Elevated PSA   . GAD (generalized anxiety disorder)   . High cholesterol   . Hypertension   . Impaired fasting glucose   . Osteoporosis    Past Surgical History:  Procedure Laterality Date  . CATARACT EXTRACTION Right   . SKIN SURGERY     shoulders and chest     Current Meds  Medication Sig  . acetaminophen (TYLENOL) 500 MG tablet Take 1,000 mg by mouth every 6 (six) hours as needed for mild pain or headache.  . albuterol (PROAIR HFA) 108 (90 Base) MCG/ACT inhaler Inhale 2 puffs into the lungs every 6 (six) hours as needed for wheezing or shortness of breath.   Marland Kitchen albuterol (PROVENTIL) (2.5 MG/3ML) 0.083% nebulizer solution Take 2.5 mg by nebulization in the morning and at bedtime.  Marland Kitchen apixaban (ELIQUIS) 5 MG TABS tablet Take 1 tablet (5 mg total) by mouth 2 (two) times daily.  Marland Kitchen atorvastatin (LIPITOR) 40 MG tablet TAKE 1 TABLET(40 MG) BY MOUTH DAILY AFTER SUPPER  . bisoprolol (ZEBETA) 5 MG tablet Take 5 mg by mouth daily.  . budesonide-formoterol (SYMBICORT) 160-4.5 MCG/ACT inhaler Inhale 2 puffs into the lungs 2 (two) times daily.  . Calcium 200 MG TABS Take 600 mg by mouth daily.  . cholecalciferol (VITAMIN D3) 25 MCG (1000 UNIT) tablet Take 1,000 Units by mouth daily.  Marland Kitchen denosumab (PROLIA) 60 MG/ML SOSY injection Inject 60 mg into the skin every 6 (six) months.  . doxycycline (VIBRA-TABS) 100 MG tablet Take 1 tablet (100 mg total) by  mouth 2 (two) times daily.  Marland Kitchen escitalopram (LEXAPRO) 5 MG tablet Take 5 mg by mouth daily.  . furosemide (LASIX) 40 MG tablet Take 1 tablet (40 mg total) by mouth daily.  Marland Kitchen guaiFENesin (MUCINEX) 600 MG 12 hr tablet Take 600 mg by mouth 2 (two) times daily.  Marland Kitchen loratadine (CLARITIN) 10 MG tablet Take 10 mg by mouth daily.  Marland Kitchen LORazepam (ATIVAN) 0.5 MG tablet Take 0.5 mg by mouth 2 (two) times daily.   . Melatonin 5 MG TABS Take 10 mg by mouth at bedtime.   Marland Kitchen omeprazole (PRILOSEC) 20 MG capsule Take 20 mg by mouth in the morning and at bedtime.  .  predniSONE (DELTASONE) 5 MG tablet Take 17.5mg  daily for 2 weeks then 15mg  daily until back in office. (Patient taking differently: 15mg  daily.)  . Tiotropium Bromide Monohydrate (SPIRIVA RESPIMAT) 2.5 MCG/ACT AERS Inhale 2 puffs into the lungs daily.  . vitamin C (ASCORBIC ACID) 250 MG tablet Take 500 mg by mouth daily.  . [DISCONTINUED] diltiazem (TIAZAC) 360 MG 24 hr capsule Take 1 capsule (360 mg total) by mouth daily.     Allergies:   Alendronate   Social History   Tobacco Use  . Smoking status: Former Smoker    Packs/day: 2.00    Years: 52.00    Pack years: 104.00    Types: Cigarettes    Quit date: 02/03/2009    Years since quitting: 11.2  . Smokeless tobacco: Never Used  . Tobacco comment: Counseled to remain smoke free  Substance Use Topics  . Alcohol use: Yes    Alcohol/week: 0.0 standard drinks    Comment: occ  . Drug use: No     Family Hx: The patient's family history includes Cancer in his paternal uncle; Heart disease in his brother, father, and mother.  ROS:   Please see the history of present illness.    Has having no chest pain.  He has had episodes of syncope in the past.   All other systems reviewed and are negative.   Prior CV studies:   The following studies were reviewed today:  ECHO 04/02/20:   1. Left ventricular ejection fraction, by estimation, is 60 to 65%. The  left ventricle has normal function. The left ventricle has no regional  wall motion abnormalities. Left ventricular diastolic parameters were  normal.  2. Right ventricular systolic function is normal. The right ventricular  size is normal. There is normal pulmonary artery systolic pressure.  3. The mitral valve is normal in structure. Trivial mitral valve  regurgitation. No evidence of mitral stenosis.  4. The aortic valve is tricuspid. Aortic valve regurgitation is not  visualized. Mild aortic valve sclerosis is present, with no evidence of  aortic valve stenosis.  5. The  inferior vena cava is normal in size with greater than 50%  respiratory variability, suggesting right atrial pressure of 3 mmHg.  Nuclear stress test 06/28/2019:  Nuclear stress EF: 69%.  Electrically negative for ischemia  Normal perfusion  This is a low risk study.  Carotid ultrasound 04/03/2020 for syncope -Mild plaque bilaterally carotid  Labs/Other Tests and Data Reviewed:   Recent Labs: 03/04/2020: B Natriuretic Peptide 82.1 04/06/2020: Hemoglobin 9.7; Platelets 353 04/20/2020: ALT 26; BUN 19; Creatinine, Ser 0.79; Potassium 5.2; Sodium 140   Recent Lipid Panel No results found for: CHOL, TRIG, HDL, CHOLHDL, LDLCALC, LDLDIRECT  Wt Readings from Last 3 Encounters:  05/03/20 133 lb (60.3 kg)  04/20/20 131 lb 6.4 oz (59.6 kg)  04/06/20 133 lb 4.8 oz (60.5 kg)     Objective:    Vital Signs:  BP (!) 113/56   Pulse 91   Temp 98.4 F (36.9 C)   Ht 5\' 6"  (1.676 m)   Wt 133 lb (60.3 kg)   SpO2 94%   BMI 21.47 kg/m    VITAL SIGNS:  reviewed GEN:  no acute distress EYES:  sclerae anicteric, EOMI - Extraocular Movements Intact RESPIRATORY:  normal respiratory effort, symmetric expansion SKIN:  no rash, lesions or ulcers. MUSCULOSKELETAL:  no obvious deformities. NEURO:  alert and oriented x 3, no obvious focal deficit PSYCH:  normal affect  ASSESSMENT & PLAN:    Hypotension -He is currently taking bisoprolol 5 mg daily, diltiazem 360 mg daily, furosemide 40 mg daily. -At first we thought about stopping his Lasix 40 mg but his wife reminded me that this is what was trying to keep his pleural effusions down as well as ankle edema down which he currently has some present. -We will go ahead and decrease his diltiazem to 180 mg once a day.  I explained to them that this may cause his heart rate to increase.  We shall see.  They had just bought the 90-day supply the 360.  Syncope -Could have been from the hypotension.  Prior carotid Dopplers were normal.  Paroxysmal atrial  fibrillation -Currently stable.  Medications reviewed as above.  COVID-19 Education: The signs and symptoms of COVID-19 were discussed with the patient and how to seek care for testing (follow up with PCP or arrange E-visit).  The importance of social distancing was discussed today.  Time:   Today, I have spent 15 minutes with the patient with telehealth technology discussing the above problems.     Medication Adjustments/Labs and Tests Ordered: Current medicines are reviewed at length with the patient today.  Concerns regarding medicines are outlined above.   Tests Ordered: No orders of the defined types were placed in this encounter.   Medication Changes: Meds ordered this encounter  Medications  . diltiazem (CARDIZEM CD) 180 MG 24 hr capsule    Sig: Take 1 capsule (180 mg total) by mouth daily.    Dispense:  90 capsule    Refill:  3    Follow Up:  Virtual Visit  in 2 week(s)  Signed, Candee Furbish, MD  05/03/2020 9:41 AM    [facility]

## 2020-05-08 DIAGNOSIS — F411 Generalized anxiety disorder: Secondary | ICD-10-CM | POA: Diagnosis not present

## 2020-05-08 DIAGNOSIS — E1169 Type 2 diabetes mellitus with other specified complication: Secondary | ICD-10-CM | POA: Diagnosis not present

## 2020-05-08 DIAGNOSIS — J441 Chronic obstructive pulmonary disease with (acute) exacerbation: Secondary | ICD-10-CM | POA: Diagnosis not present

## 2020-05-08 DIAGNOSIS — E1165 Type 2 diabetes mellitus with hyperglycemia: Secondary | ICD-10-CM | POA: Diagnosis not present

## 2020-05-08 DIAGNOSIS — Z9181 History of falling: Secondary | ICD-10-CM | POA: Diagnosis not present

## 2020-05-08 DIAGNOSIS — Z7901 Long term (current) use of anticoagulants: Secondary | ICD-10-CM | POA: Diagnosis not present

## 2020-05-08 DIAGNOSIS — Z87891 Personal history of nicotine dependence: Secondary | ICD-10-CM | POA: Diagnosis not present

## 2020-05-08 DIAGNOSIS — I110 Hypertensive heart disease with heart failure: Secondary | ICD-10-CM | POA: Diagnosis not present

## 2020-05-08 DIAGNOSIS — E876 Hypokalemia: Secondary | ICD-10-CM | POA: Diagnosis not present

## 2020-05-08 DIAGNOSIS — D638 Anemia in other chronic diseases classified elsewhere: Secondary | ICD-10-CM | POA: Diagnosis not present

## 2020-05-08 DIAGNOSIS — J9601 Acute respiratory failure with hypoxia: Secondary | ICD-10-CM | POA: Diagnosis not present

## 2020-05-08 DIAGNOSIS — I5032 Chronic diastolic (congestive) heart failure: Secondary | ICD-10-CM | POA: Diagnosis not present

## 2020-05-08 DIAGNOSIS — E782 Mixed hyperlipidemia: Secondary | ICD-10-CM | POA: Diagnosis not present

## 2020-05-08 DIAGNOSIS — R42 Dizziness and giddiness: Secondary | ICD-10-CM | POA: Diagnosis not present

## 2020-05-08 DIAGNOSIS — I480 Paroxysmal atrial fibrillation: Secondary | ICD-10-CM | POA: Diagnosis not present

## 2020-05-08 DIAGNOSIS — G9349 Other encephalopathy: Secondary | ICD-10-CM | POA: Diagnosis not present

## 2020-05-08 DIAGNOSIS — R55 Syncope and collapse: Secondary | ICD-10-CM | POA: Diagnosis not present

## 2020-05-09 ENCOUNTER — Other Ambulatory Visit: Payer: Self-pay

## 2020-05-09 ENCOUNTER — Encounter: Payer: Self-pay | Admitting: Adult Health

## 2020-05-09 ENCOUNTER — Ambulatory Visit (INDEPENDENT_AMBULATORY_CARE_PROVIDER_SITE_OTHER): Payer: Medicare Other | Admitting: Adult Health

## 2020-05-09 DIAGNOSIS — J9611 Chronic respiratory failure with hypoxia: Secondary | ICD-10-CM | POA: Diagnosis not present

## 2020-05-09 DIAGNOSIS — R5381 Other malaise: Secondary | ICD-10-CM

## 2020-05-09 DIAGNOSIS — J449 Chronic obstructive pulmonary disease, unspecified: Secondary | ICD-10-CM

## 2020-05-09 MED ORDER — ALBUTEROL SULFATE (2.5 MG/3ML) 0.083% IN NEBU: 2.5000 mg | INHALATION_SOLUTION | Freq: Two times a day (BID) | RESPIRATORY_TRACT | 6 refills | Status: DC

## 2020-05-09 NOTE — Patient Instructions (Addendum)
Decrease Prednisone 15mg  daily for 2 weeks then Prednisone 12.5mg  daily for 2 weeks  And then hold Prednisone 10mg  daily  Continue on Symbicort 2 puffs Twice daily   Continue on Spiriva daily  Continue on Albuterol Neb Twice daily  .  Wear Oxygen 2l/m all the time .  Continue on Daliresp daily.  Please contact office for sooner follow up if symptoms do not improve or worsen or seek emergency care  Follow up with Dr. Melvyn Novas  Or Diamon Reddinger NP in 6  weeks and As needed

## 2020-05-09 NOTE — Assessment & Plan Note (Signed)
Prone to flares, currently doing well  Will go down slowly on prednisone .  Cont w/ Home PT   Plan  Patient Instructions  Decrease Prednisone 15mg  daily for 2 weeks then Prednisone 12.5mg  daily for 2 weeks  And then hold Prednisone 10mg  daily  Continue on Symbicort 2 puffs Twice daily   Continue on Spiriva daily  Continue on Albuterol Neb Twice daily  .  Wear Oxygen 2l/m all the time .  Continue on Daliresp daily.  Please contact office for sooner follow up if symptoms do not improve or worsen or seek emergency care  Follow up with Dr. Melvyn Novas  Or Suhailah Kwan NP in 6  weeks and As needed

## 2020-05-09 NOTE — Assessment & Plan Note (Signed)
Cont w/ home PT  

## 2020-05-09 NOTE — Assessment & Plan Note (Signed)
Compensated on oxygen 

## 2020-05-09 NOTE — Progress Notes (Signed)
@Patient  ID: Paul [name], male    DOB: [birth year], 74 y.o.   MRN: [id number]  Chief Complaint  Patient presents with  . Follow-up    COPD     Referring provider: [name], MD  HPI: 74 year old male former smoker quit in 2010 followed for severe gold 3 COPD and chronic respiratory failure on oxygen.  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  Patient is prone to frequent COPD exacerbations had multiple hospitalizations over the last several months.  Last visit after recent hospitalization.  Palliative care is following at home . Has PT at home  Patient remains on Symbicort and Spiriva.  Only on a slow prednisone taper at 15 mg daily.  He remains on oxygen 2 L.  He.  He also remains on Daliresp daily. Patient says he is feeling a lot better feeling a lot better. Feels he is getting stronger. Did have some congestion that came up last week after been out in pollen , took Doxycycline and congestion has resolved.  No chest pain, orthopnea or edema.    Allergies  Allergen Reactions  . Daliresp [Roflumilast]     Nausea, vomiting and diarrhea.  . Alendronate Other (See Comments)    Jittery/nervous    Immunization History  Administered Date(s) Administered  . Influenza Split 09/22/2013, 09/01/2015  . Influenza Whole 09/04/2011,  09/21/2012  . Influenza, High Dose Seasonal PF 09/02/2016, 09/10/2017, 08/31/2018, 09/24/2018, 10/21/2019  . Influenza-Unspecified 08/23/2014  . Moderna SARS-COVID-2 Vaccination 01/17/2020, 02/14/2020  . Pneumococcal Conjugate-13 01/11/2014, 12/09/2016  . Pneumococcal Polysaccharide-23 08/23/2010, 08/29/2011  . Zoster Recombinat (Shingrix) 04/04/2014    Past Medical History:  Diagnosis Date  . A-fib ([place])   . Anemia   . Atrial fibrillation ([place])   . Back pain   . COPD (chronic obstructive pulmonary disease) ([place])   . COPD (chronic obstructive pulmonary disease) ([place])   . Elevated PSA   . GAD (generalized anxiety disorder)   . High cholesterol   . Hypertension   . Impaired fasting glucose   . Osteoporosis     Tobacco History: Social History   Tobacco Use  Smoking Status Former Smoker  . Packs/day: 2.00  . Years: 52.00  . Pack years: 104.00  . Types: Cigarettes  . Quit date: 02/03/2009  . Years since quitting: 11.2  Smokeless Tobacco Never Used  Tobacco Comment   Counseled to remain smoke free   Counseling given: Not Answered Comment: Counseled to remain smoke free   Outpatient Medications Prior to Visit  Medication Sig Dispense Refill  . acetaminophen (TYLENOL) 500 MG tablet Take 1,000 mg by mouth every 6 (six) hours as needed for mild pain or headache.    . albuterol (PROAIR HFA) 108 (90 Base) MCG/ACT inhaler Inhale 2 puffs into the lungs every 6 (six) hours as needed for  wheezing or shortness of breath.     Marland Kitchen apixaban (ELIQUIS) 5 MG TABS tablet Take 1 tablet (5 mg total) by mouth 2 (two) times daily. 180 tablet 3  . atorvastatin (LIPITOR) 40 MG tablet TAKE 1 TABLET(40 MG) BY MOUTH DAILY AFTER SUPPER 90 tablet 3  . bisoprolol (ZEBETA) 5 MG tablet Take 5 mg by mouth daily.    . budesonide-formoterol (SYMBICORT) 160-4.5 MCG/ACT inhaler Inhale 2 puffs into the lungs 2 (two) times daily.    . Calcium 200 MG TABS Take 600 mg by mouth daily.    . cholecalciferol (VITAMIN  D3) 25 MCG (1000 UNIT) tablet Take 1,000 Units by mouth daily.    Marland Kitchen denosumab (PROLIA) 60 MG/ML SOSY injection Inject 60 mg into the skin every 6 (six) months.    . diltiazem (CARDIZEM CD) 180 MG 24 hr capsule Take 1 capsule (180 mg total) by mouth daily. 90 capsule 3  . escitalopram (LEXAPRO) 5 MG tablet Take 5 mg by mouth daily.    . furosemide (LASIX) 40 MG tablet Take 1 tablet (40 mg total) by mouth daily. 90 tablet 3  . guaiFENesin (MUCINEX) 600 MG 12 hr tablet Take 600 mg by mouth 2 (two) times daily.    Marland Kitchen loratadine (CLARITIN) 10 MG tablet Take 10 mg by mouth daily.    Marland Kitchen LORazepam (ATIVAN) 0.5 MG tablet Take 0.5 mg by mouth 2 (two) times daily.     . Melatonin 5 MG TABS Take 10 mg by mouth at bedtime.     Marland Kitchen omeprazole (PRILOSEC) 20 MG capsule Take 20 mg by mouth in the morning and at bedtime.    . predniSONE (DELTASONE) 5 MG tablet Take 17.5mg  daily for 2 weeks then 15mg  daily until back in office. (Patient taking differently: 15mg  daily.) 90 tablet 1  . Tiotropium Bromide Monohydrate (SPIRIVA RESPIMAT) 2.5 MCG/ACT AERS Inhale 2 puffs into the lungs daily. 8 g 0  . vitamin C (ASCORBIC ACID) 250 MG tablet Take 500 mg by mouth daily.    Marland Kitchen albuterol (PROVENTIL) (2.5 MG/3ML) 0.083% nebulizer solution Take 2.5 mg by nebulization in the morning and at bedtime.    Marland Kitchen doxycycline (VIBRA-TABS) 100 MG tablet Take 1 tablet (100 mg total) by mouth 2 (two) times daily. 14 tablet 0   No facility-administered medications prior to visit.     Review of Systems:   Constitutional:   No  weight loss, night sweats,  Fevers, chills,  +fatigue, or  lassitude.  HEENT:   No headaches,  Difficulty swallowing,  Tooth/dental problems, or  Sore throat,                No sneezing, itching, ear ache, nasal congestion, post nasal drip,   CV:  No chest pain,  Orthopnea, PND, swelling in lower extremities, anasarca, dizziness, palpitations, syncope.   GI  No heartburn, indigestion, abdominal pain, nausea,  vomiting, diarrhea, change in bowel habits, loss of appetite, bloody stools.   Resp:  No chest wall deformity  Skin: no rash or lesions.  GU: no dysuria, change in color of urine, no urgency or frequency.  No flank pain, no hematuria   MS:  No joint pain or swelling.  No decreased range of motion.  No back pain.    Physical Exam  BP (!) 108/44 (BP Location: Left Arm, Cuff Size: Normal) Comment: provider made aware  Pulse 78   Temp 97.9 F (36.6 C) (Temporal)   Ht 5' 5.5" (1.664 m)   [employer] [facility]  139 lb 3.2 oz (63.1 kg)   SpO2 97%   BMI 22.81 kg/m   GEN: A/Ox3; pleasant , NAD, thin and elderly    HEENT:  [place]/AT,    NOSE-clear, THROAT-clear, no lesions, no postnasal drip or exudate noted.   NECK:  Supple w/ fair ROM; no JVD; normal carotid impulses w/o bruits; no thyromegaly or nodules palpated; no lymphadenopathy.    RESP  Clear  P & A; w/o, wheezes/ rales/ or rhonchi. no accessory muscle use, no dullness to percussion  CARD:  RRR, no m/r/g, no peripheral edema, pulses intact, no cyanosis or clubbing.  GI:   Soft & nt; nml bowel sounds; no organomegaly or masses detected.   Musco: Warm bil, no deformities or joint swelling noted.   Neuro: alert, no focal deficits noted.    Skin: Warm, no lesions or rashes    Lab Results:  CBC   BMET  BNP    PFT Results Latest Ref Rng & Units 05/29/2016  FVC-Pre L 1.92  FVC-Predicted Pre % 52  Pre FEV1/FVC % % 41  FEV1-Pre L 0.78  FEV1-Predicted Pre % 29    No results found for: NITRICOXIDE      Assessment & Plan:   COPD GOLD  III  Prone to flares, currently doing well  Will go down slowly on prednisone .  Cont w/ Home PT   Plan  Patient Instructions  Decrease Prednisone 15mg  daily for 2 weeks then Prednisone 12.5mg  daily for 2 weeks  And then hold Prednisone 10mg  daily  Continue on Symbicort 2 puffs Twice daily   Continue on Spiriva daily  Continue on Albuterol Neb Twice daily  .  Wear Oxygen 2l/m all the time .    Continue on Daliresp daily.  Please contact office for sooner follow up if symptoms do not improve or worsen or seek emergency care  Follow up with Dr. Melvyn Novas  Or Andersyn Fragoso NP in 6  weeks and As needed        Chronic respiratory failure with hypoxia ([place]) Compensated on oxygen   Physical deconditioning Cont w/ home PT      Rexene Edison, NP 05/09/2020

## 2020-05-10 DIAGNOSIS — E1165 Type 2 diabetes mellitus with hyperglycemia: Secondary | ICD-10-CM | POA: Diagnosis not present

## 2020-05-10 DIAGNOSIS — J9601 Acute respiratory failure with hypoxia: Secondary | ICD-10-CM | POA: Diagnosis not present

## 2020-05-10 DIAGNOSIS — R55 Syncope and collapse: Secondary | ICD-10-CM | POA: Diagnosis not present

## 2020-05-10 DIAGNOSIS — R42 Dizziness and giddiness: Secondary | ICD-10-CM | POA: Diagnosis not present

## 2020-05-10 DIAGNOSIS — J441 Chronic obstructive pulmonary disease with (acute) exacerbation: Secondary | ICD-10-CM | POA: Diagnosis not present

## 2020-05-10 DIAGNOSIS — E876 Hypokalemia: Secondary | ICD-10-CM | POA: Diagnosis not present

## 2020-05-12 ENCOUNTER — Telehealth: Payer: Self-pay | Admitting: Internal Medicine

## 2020-05-12 ENCOUNTER — Telehealth: Payer: Self-pay | Admitting: Cardiology

## 2020-05-12 DIAGNOSIS — R55 Syncope and collapse: Secondary | ICD-10-CM | POA: Diagnosis not present

## 2020-05-12 DIAGNOSIS — J441 Chronic obstructive pulmonary disease with (acute) exacerbation: Secondary | ICD-10-CM | POA: Diagnosis not present

## 2020-05-12 DIAGNOSIS — R42 Dizziness and giddiness: Secondary | ICD-10-CM | POA: Diagnosis not present

## 2020-05-12 DIAGNOSIS — E876 Hypokalemia: Secondary | ICD-10-CM | POA: Diagnosis not present

## 2020-05-12 DIAGNOSIS — E1165 Type 2 diabetes mellitus with hyperglycemia: Secondary | ICD-10-CM | POA: Diagnosis not present

## 2020-05-12 DIAGNOSIS — J9601 Acute respiratory failure with hypoxia: Secondary | ICD-10-CM | POA: Diagnosis not present

## 2020-05-12 NOTE — Telephone Encounter (Signed)
As long as patient is feeling good lets use Mucinex DM twice daily as needed for cough and congestion.  Follow cardiology's recommendations for weight gain as his cough could be due to extra fluid.  If cough persist or worsens give our office a call back and we will set up a televisit or an office visit.  Please contact office for sooner follow up if symptoms do not improve or worsen or seek emergency care

## 2020-05-12 NOTE — Telephone Encounter (Signed)
Spoke with Santiago Glad at [employer]. States that she had a home visit with pt today. Reports that pt has started coughing again after stopping the antibiotic. He has also gain 5lbs, pt thinks it's from eating well but his wife doesn't think it is. They have already put a call in to cardiology about this. Pt states that he feels good. Santiago Glad would like [place] to call the pt's wife back once message is addressed.  Tammy - please advise. Thanks.

## 2020-05-12 NOTE — Telephone Encounter (Signed)
Pt has a VV with Dr. Marlou Porch on 05/23/20 scheduled already.

## 2020-05-12 NOTE — Telephone Encounter (Signed)
Spoke with pt's wife. She is aware of Tammy's response. Nothing further needed.

## 2020-05-12 NOTE — Telephone Encounter (Signed)
New Message  [name] from [facility] called and stated that she really needed to speak with a nurse due to the urgency of the situation. Dr. Marlou Porch nurse Jeannene Patella was unavailable and so sent to triage nurse

## 2020-05-12 NOTE — Telephone Encounter (Signed)
Spoke with [place]. She saw patient on 05/03/20 and today. Weight was 133 on 5/12, today 139.2. He has +2 pitting ankle edema.  BP 108/56, HR 80 Has +cough and +sob at baseline from his end stage COPD.  These are unchanged.  Reviewed with Dr. Johnsie Cancel - DOD and then instructed patient to increase lasix 40 mg to BID x 3 days and have him follow up with Dr. Marlou Porch or APP next week.  The patient and the nurse have been informed. The only availability I see for next week is virtual.  Pt is ok w this but I was not sure if he would need to come in for labs also.  So I asked him to call in on Monday am with his weight and swelling update and make a plan for follow up with Dr. Lyndel Safe primary nurse.  Pt in agreement with this plan.

## 2020-05-15 ENCOUNTER — Telehealth: Payer: Self-pay | Admitting: Cardiology

## 2020-05-15 NOTE — Telephone Encounter (Signed)
Agree with plan Katherina Wimer, MD  

## 2020-05-15 NOTE — Telephone Encounter (Signed)
Pt c/o swelling: STAT is pt has developed SOB within 24 hours  1. How much weight have you gained and in what time span? No major weight gain  If swelling, where is the swelling located? Swelling has subsided, however Paul [name] states she is calling to follow up with the nurse in regards to patient's symptoms. She states doubling furosemide (LASIX) 40 MG  Has helped.  2. Are you currently taking a fluid pill? Yes  3. Are you currently SOB? No  4. Do you have a log of your daily weights (if so, list)?   05/21: 139.2 lbs 05/22: 137 05/23: 136.8 05/24: 137.4  5. Have you gained 3 pounds in a day or 5 pounds in a week? N/A  6. Have you traveled recently? No

## 2020-05-15 NOTE — Telephone Encounter (Signed)
Spoke with [name] (wife) - pt's wt is 137.4 lbs today, had been 136.8 lbs yesterday.  Pt denies any SOB,edema etc.  Advised to continue current medications as listed.  Continue to monitor daily wts and notify us is any change in S/S, edema, or wt gain.  Wife states understanding.

## 2020-05-17 DIAGNOSIS — J441 Chronic obstructive pulmonary disease with (acute) exacerbation: Secondary | ICD-10-CM | POA: Diagnosis not present

## 2020-05-17 DIAGNOSIS — R55 Syncope and collapse: Secondary | ICD-10-CM | POA: Diagnosis not present

## 2020-05-17 DIAGNOSIS — J9601 Acute respiratory failure with hypoxia: Secondary | ICD-10-CM | POA: Diagnosis not present

## 2020-05-17 DIAGNOSIS — E876 Hypokalemia: Secondary | ICD-10-CM | POA: Diagnosis not present

## 2020-05-17 DIAGNOSIS — E1165 Type 2 diabetes mellitus with hyperglycemia: Secondary | ICD-10-CM | POA: Diagnosis not present

## 2020-05-17 DIAGNOSIS — R42 Dizziness and giddiness: Secondary | ICD-10-CM | POA: Diagnosis not present

## 2020-05-18 DIAGNOSIS — R42 Dizziness and giddiness: Secondary | ICD-10-CM | POA: Diagnosis not present

## 2020-05-18 DIAGNOSIS — J9601 Acute respiratory failure with hypoxia: Secondary | ICD-10-CM | POA: Diagnosis not present

## 2020-05-18 DIAGNOSIS — R55 Syncope and collapse: Secondary | ICD-10-CM | POA: Diagnosis not present

## 2020-05-18 DIAGNOSIS — E1165 Type 2 diabetes mellitus with hyperglycemia: Secondary | ICD-10-CM | POA: Diagnosis not present

## 2020-05-18 DIAGNOSIS — E876 Hypokalemia: Secondary | ICD-10-CM | POA: Diagnosis not present

## 2020-05-18 DIAGNOSIS — J441 Chronic obstructive pulmonary disease with (acute) exacerbation: Secondary | ICD-10-CM | POA: Diagnosis not present

## 2020-05-23 ENCOUNTER — Telehealth (INDEPENDENT_AMBULATORY_CARE_PROVIDER_SITE_OTHER): Payer: Medicare Other | Admitting: Cardiology

## 2020-05-23 ENCOUNTER — Other Ambulatory Visit: Payer: Self-pay

## 2020-05-23 ENCOUNTER — Encounter: Payer: Self-pay | Admitting: Cardiology

## 2020-05-23 VITALS — BP 117/63 | HR 80 | Ht 65.5 in | Wt 138.2 lb

## 2020-05-23 DIAGNOSIS — J438 Other emphysema: Secondary | ICD-10-CM | POA: Diagnosis not present

## 2020-05-23 DIAGNOSIS — Z7901 Long term (current) use of anticoagulants: Secondary | ICD-10-CM | POA: Diagnosis not present

## 2020-05-23 DIAGNOSIS — I480 Paroxysmal atrial fibrillation: Secondary | ICD-10-CM

## 2020-05-23 NOTE — Progress Notes (Signed)
Virtual Visit via Video Note   This visit type was conducted due to national recommendations for restrictions regarding the COVID-19 Pandemic (e.g. social distancing) in an effort to limit this patient's exposure and mitigate transmission in our community.  Due to his co-morbid illnesses, this patient is at least at moderate risk for complications without adequate follow up.  This format is felt to be most appropriate for this patient at this time.  All issues noted in this document were discussed and addressed.  A limited physical exam was performed with this format.  Please refer to the patient's chart for his consent to telehealth for [facility].   The patient was identified using 2 identifiers.  Date:  05/23/2020   ID:  Paul [name], DOB 10-Apr-1946, MRN [id number]  Patient Location: Home Provider Location: Office  PCP:  [name], MD  Cardiologist:  Candee Furbish, MD  Electrophysiologist:  None   Evaluation Performed:  Follow-Up Visit  Chief Complaint: Here for the follow-up of edema  History of Present Illness:    Paul [name] is a 74 y.o. male severe underlying COPD with cough and shortness of breath followed by pulmonary, Dr. Work who had been complaining of 2+ pitting ankle edema.  Weight was 133, finally 139.  This was reviewed with Dr. The day and he was to increase his Lasix to 40 mg twice daily for 3 days.   He was not sure whether or not he was going to come in for labs.  He currently feels a lot better.  Less swelling.  Still having some orthostatic type symptoms.  No chest pain.  [birth year] birthday.  He is happy is out of the hospital.  The patient does not have symptoms concerning for COVID-19 infection (fever, chills, cough, or new shortness of breath).    Past Medical History:  Diagnosis Date   A-fib ([place])    Anemia    Atrial fibrillation ([facility])    Back pain    COPD (chronic obstructive pulmonary disease) ([facility])    COPD (chronic obstructive pulmonary  disease) ([facility])    Elevated PSA    GAD (generalized anxiety disorder)    High cholesterol    Hypertension    Impaired fasting glucose    Osteoporosis    Past Surgical History:  Procedure Laterality Date   CATARACT EXTRACTION Right    SKIN SURGERY     shoulders and chest     Current Meds  Medication Sig   acetaminophen (TYLENOL) 500 MG tablet Take 1,000 mg by mouth every 6 (six) hours as needed for mild pain or headache.   albuterol (PROAIR HFA) 108 (90 Base) MCG/ACT inhaler Inhale 2 puffs into the lungs every 6 (six) hours as needed for wheezing or shortness of breath.    albuterol (PROVENTIL) (2.5 MG/3ML) 0.083% nebulizer solution Take 3 mLs (2.5 mg total) by nebulization in the morning and at bedtime.   apixaban (ELIQUIS) 5 MG TABS tablet Take 1 tablet (5 mg total) by mouth 2 (two) times daily.   atorvastatin (LIPITOR) 40 MG tablet TAKE 1 TABLET(40 MG) BY MOUTH DAILY AFTER SUPPER   bisoprolol (ZEBETA) 5 MG tablet Take 5 mg by mouth daily.   budesonide-formoterol (SYMBICORT) 160-4.5 MCG/ACT inhaler Inhale 2 puffs into the lungs 2 (two) times daily.   Calcium 200 MG TABS Take 600 mg by mouth daily.   cholecalciferol (VITAMIN D3) 25 MCG (1000 UNIT) tablet Take 1,000 Units by mouth daily.   denosumab (PROLIA) 60 MG/ML  SOSY injection Inject 60 mg into the skin every 6 (six) months.   diltiazem (CARDIZEM CD) 180 MG 24 hr capsule Take 1 capsule (180 mg total) by mouth daily.   escitalopram (LEXAPRO) 5 MG tablet Take 5 mg by mouth daily.   furosemide (LASIX) 40 MG tablet Take 1 tablet (40 mg total) by mouth daily.   guaiFENesin (MUCINEX) 600 MG 12 hr tablet Take 600 mg by mouth 2 (two) times daily.   loratadine (CLARITIN) 10 MG tablet Take 10 mg by mouth daily.   LORazepam (ATIVAN) 0.5 MG tablet Take 0.5 mg by mouth 2 (two) times daily.    Melatonin 5 MG TABS Take 10 mg by mouth at bedtime.    omeprazole (PRILOSEC) 20 MG capsule Take 20 mg by mouth in the morning  and at bedtime.   predniSONE (DELTASONE) 5 MG tablet Take 17.5mg  daily for 2 weeks then 15mg  daily until back in office. (Patient taking differently: 15mg  daily.)   Tiotropium Bromide Monohydrate (SPIRIVA RESPIMAT) 2.5 MCG/ACT AERS Inhale 2 puffs into the lungs daily.   vitamin C (ASCORBIC ACID) 250 MG tablet Take 500 mg by mouth daily.     Allergies:   Daliresp [roflumilast] and Alendronate   Social History   Tobacco Use   Smoking status: Former Smoker    Packs/day: 2.00    Years: 52.00    Pack years: 104.00    Types: Cigarettes    Quit date: 02/03/2009    Years since quitting: 11.3   Smokeless tobacco: Never Used   Tobacco comment: Counseled to remain smoke free  Substance Use Topics   Alcohol use: Yes    Alcohol/week: 0.0 standard drinks    Comment: occ   Drug use: No     Family Hx: The patient's family history includes Cancer in his paternal uncle; Heart disease in his brother, father, and mother.  ROS:   Please see the history of present illness.     All other systems reviewed and are negative.   Prior CV studies:   The following studies were reviewed today:  ECHO 04/02/20:   1. Left ventricular ejection fraction, by estimation, is 60 to 65%. The  left ventricle has normal function. The left ventricle has no regional  wall motion abnormalities. Left ventricular diastolic parameters were  normal.  2. Right ventricular systolic function is normal. The right ventricular  size is normal. There is normal pulmonary artery systolic pressure.  3. The mitral valve is normal in structure. Trivial mitral valve  regurgitation. No evidence of mitral stenosis.  4. The aortic valve is tricuspid. Aortic valve regurgitation is not  visualized. Mild aortic valve sclerosis is present, with no evidence of  aortic valve stenosis.  5. The inferior vena cava is normal in size with greater than 50%  respiratory variability, suggesting right atrial pressure of 3  mmHg.  Nuclear stress test 06/28/2019:  Nuclear stress EF: 69%.  Electrically negative for ischemia  Normal perfusion  This is a low risk study.  Carotid ultrasound 04/03/2020 for syncope -Mild plaque bilaterally carotid  Labs/Other Tests and Data Reviewed:     Recent Labs: 03/04/2020: B Natriuretic Peptide 82.1 04/06/2020: Hemoglobin 9.7; Platelets 353 04/20/2020: ALT 26; BUN 19; Creatinine, Ser 0.79; Potassium 5.2; Sodium 140   Recent Lipid Panel No results found for: CHOL, TRIG, HDL, CHOLHDL, LDLCALC, LDLDIRECT  Wt Readings from Last 3 Encounters:  05/23/20 138 lb 3.2 oz (62.7 kg)  05/09/20 139 lb 3.2 oz (63.1 kg)  05/03/20  133 lb (60.3 kg)     Objective:    Vital Signs:  BP 117/63    Pulse 80    Ht 5' 5.5" (1.664 m)    Wt 138 lb 3.2 oz (62.7 kg)    BMI 22.65 kg/m    VITAL SIGNS:  reviewed GEN:  no acute distress EYES:  sclerae anicteric, EOMI - Extraocular Movements Intact RESPIRATORY:  normal respiratory effort, symmetric expansion SKIN:  no rash, lesions or ulcers. MUSCULOSKELETAL:  no obvious deformities. NEURO:  alert and oriented x 3, no obvious focal deficit PSYCH:  normal affect  ASSESSMENT & PLAN:    Lower extremity edema -Given Lasix 40 mg twice a day for 3 consecutive days.  Continue with other conservative measures such as salt decrease elevation of legs compression.  Overall improved.  Exacerbating factors are prednisone that he takes.  Severe COPD -6 hospitalizations recently, Dr. Melvyn Novas.  Medications reviewed.  Paroxysmal atrial fibrillation -Rate control strategy has been hindered by hypotension. -Was taking bisoprolol 5 mg a day and diltiazem 360 mg a day as well as furosemide 40 mg a day.  His diltiazem was decreased to 180 mg a day.  Chronic anticoagulation -Eliquis 5 mg twice a day.  His wife, [name], asked if he could possibly take the 2.5 mg dose however that would be under prescribed for his age weight and creatinine.  Pleural  effusion -Lasix is being administered as well to help alleviate/minimize the chance of pleural effusion returning.  Ankle edema as well.  Prior syncope -Could have been from hypotension.  Prior carotids normal.  Be careful when standing up quickly.  Orthostatic type symptoms.    COVID-19 Education: The signs and symptoms of COVID-19 were discussed with the patient and how to seek care for testing (follow up with PCP or arrange E-visit).  The importance of social distancing was discussed today.  Time:   Today, I have spent 21 minutes with the patient with telehealth technology discussing the above problems.     Medication Adjustments/Labs and Tests Ordered: Current medicines are reviewed at length with the patient today.  Concerns regarding medicines are outlined above.   Tests Ordered: No orders of the defined types were placed in this encounter.   Medication Changes: No orders of the defined types were placed in this encounter.   Follow Up:  In Person in 3 month(s)  Signed, Candee Furbish, MD  05/23/2020 9:39 AM    [place]

## 2020-05-23 NOTE — Patient Instructions (Signed)
Medication Instructions:  °The current medical regimen is effective;  continue present plan and medications. ° °*If you need a refill on your cardiac medications before your next appointment, please call your pharmacy* ° °Follow-Up: °At [facility], you and your health needs are our priority.  As part of our continuing mission to provide you with exceptional heart care, we have created designated Provider Care Teams.  These Care Teams include your primary Cardiologist (physician) and Advanced Practice Providers (APPs -  Physician Assistants and Nurse Practitioners) who all work together to provide you with the care you need, when you need it. ° °We recommend signing up for the patient portal called "MyChart".  Sign up information is provided on this After Visit Summary.  MyChart is used to connect with patients for Virtual Visits (Telemedicine).  Patients are able to view lab/test results, encounter notes, upcoming appointments, etc.  Non-urgent messages can be sent to your provider as well.   °To learn more about what you can do with MyChart, go to [email or web address].   ° °Your next appointment:   °3 month(s) ° °The format for your next appointment:   °In Person ° °Provider:   °Mark Skains, MD ° ° °Thank you for choosing [facility]!! ° ° ° ° °

## 2020-05-24 DIAGNOSIS — R42 Dizziness and giddiness: Secondary | ICD-10-CM | POA: Diagnosis not present

## 2020-05-24 DIAGNOSIS — R55 Syncope and collapse: Secondary | ICD-10-CM | POA: Diagnosis not present

## 2020-05-24 DIAGNOSIS — J441 Chronic obstructive pulmonary disease with (acute) exacerbation: Secondary | ICD-10-CM | POA: Diagnosis not present

## 2020-05-24 DIAGNOSIS — J9601 Acute respiratory failure with hypoxia: Secondary | ICD-10-CM | POA: Diagnosis not present

## 2020-05-24 DIAGNOSIS — E1165 Type 2 diabetes mellitus with hyperglycemia: Secondary | ICD-10-CM | POA: Diagnosis not present

## 2020-05-24 DIAGNOSIS — E876 Hypokalemia: Secondary | ICD-10-CM | POA: Diagnosis not present

## 2020-05-26 DIAGNOSIS — R42 Dizziness and giddiness: Secondary | ICD-10-CM | POA: Diagnosis not present

## 2020-05-26 DIAGNOSIS — E1165 Type 2 diabetes mellitus with hyperglycemia: Secondary | ICD-10-CM | POA: Diagnosis not present

## 2020-05-26 DIAGNOSIS — J9601 Acute respiratory failure with hypoxia: Secondary | ICD-10-CM | POA: Diagnosis not present

## 2020-05-26 DIAGNOSIS — R55 Syncope and collapse: Secondary | ICD-10-CM | POA: Diagnosis not present

## 2020-05-26 DIAGNOSIS — E876 Hypokalemia: Secondary | ICD-10-CM | POA: Diagnosis not present

## 2020-05-26 DIAGNOSIS — J441 Chronic obstructive pulmonary disease with (acute) exacerbation: Secondary | ICD-10-CM | POA: Diagnosis not present

## 2020-05-30 DIAGNOSIS — R55 Syncope and collapse: Secondary | ICD-10-CM | POA: Diagnosis not present

## 2020-05-30 DIAGNOSIS — J441 Chronic obstructive pulmonary disease with (acute) exacerbation: Secondary | ICD-10-CM | POA: Diagnosis not present

## 2020-05-30 DIAGNOSIS — R42 Dizziness and giddiness: Secondary | ICD-10-CM | POA: Diagnosis not present

## 2020-05-30 DIAGNOSIS — J9601 Acute respiratory failure with hypoxia: Secondary | ICD-10-CM | POA: Diagnosis not present

## 2020-05-30 DIAGNOSIS — E876 Hypokalemia: Secondary | ICD-10-CM | POA: Diagnosis not present

## 2020-05-30 DIAGNOSIS — E1165 Type 2 diabetes mellitus with hyperglycemia: Secondary | ICD-10-CM | POA: Diagnosis not present

## 2020-06-01 ENCOUNTER — Telehealth: Payer: Self-pay | Admitting: Cardiology

## 2020-06-01 MED ORDER — FUROSEMIDE 40 MG PO TABS: ORAL_TABLET | ORAL | 3 refills | Status: DC

## 2020-06-01 NOTE — Telephone Encounter (Signed)
Spoke with patient who is having edema and SOB.  He did not take the extra Furosemide as instructed at last office visit with Dr Marlou Porch.  Advised OK to take 1 tablet twice a day for 3 days as needed for wt gain/SOB/edema.  He states understanding.  Refill with new directions sent into pharmacy.

## 2020-06-01 NOTE — Telephone Encounter (Signed)
New Message  Nurse [name] from [place] is calling to speak with Pam (Dr. Marlou Porch nurse). Transferred call.

## 2020-06-01 NOTE — Telephone Encounter (Signed)
Paul [name] said the patient's states he did not take lasix twice a day for 3 days on 6/1.  She did tell the patient to not take it today until he heard from our office.

## 2020-06-01 NOTE — Telephone Encounter (Signed)
Received call from [place] which is the Palliative care side of Hospice in [facility].  [name] states pt's wt is up to 142 LBS.  During 6/1 phone visit with Dr Marlou Porch, pt was instructed to take Furosemide 40 mg BID for 3 days.  She is asking if this is a standing order and how often pt can do this.   She would like to know if pt can repeat this medication increase now.  Advised I will forward this information to Dr Marlou Porch for review and further orders.

## 2020-06-02 NOTE — Telephone Encounter (Signed)
Agree with plan Natsha Guidry, MD  

## 2020-06-05 ENCOUNTER — Telehealth: Payer: Self-pay | Admitting: Cardiology

## 2020-06-05 DIAGNOSIS — R55 Syncope and collapse: Secondary | ICD-10-CM | POA: Diagnosis not present

## 2020-06-05 DIAGNOSIS — R42 Dizziness and giddiness: Secondary | ICD-10-CM | POA: Diagnosis not present

## 2020-06-05 DIAGNOSIS — E1165 Type 2 diabetes mellitus with hyperglycemia: Secondary | ICD-10-CM | POA: Diagnosis not present

## 2020-06-05 DIAGNOSIS — J441 Chronic obstructive pulmonary disease with (acute) exacerbation: Secondary | ICD-10-CM | POA: Diagnosis not present

## 2020-06-05 DIAGNOSIS — E876 Hypokalemia: Secondary | ICD-10-CM | POA: Diagnosis not present

## 2020-06-05 DIAGNOSIS — J9601 Acute respiratory failure with hypoxia: Secondary | ICD-10-CM | POA: Diagnosis not present

## 2020-06-05 MED ORDER — FUROSEMIDE 40 MG PO TABS: 40.0000 mg | ORAL_TABLET | Freq: Two times a day (BID) | ORAL | 3 refills | Status: DC

## 2020-06-05 NOTE — Telephone Encounter (Signed)
Wife of the patient called. Husband gave verbal permission for me to speak with the wife who was not listed on the most recent DPR.  Pt c/o medication issue:  1. Name of Medication: furosemide (LASIX) 40 MG tablet  2. How are you currently taking this medication (dosage and times per day)? 80 mg as directed by Pam on Friday 06/01/20  3. Are you having a reaction (difficulty breathing--STAT)?   4. What is your medication issue? PT has not reduced his swelling. He has doubled his lasix for the past three days with no result. His wife wants to know what to do to reduce the swelling   Pt c/o swelling: STAT is pt has developed SOB within 24 hours  1) How much weight have you gained and in what time span?  5 pounds in a week  2) If swelling, where is the swelling located? Feet, ankles and legs  3) Are you currently taking a fluid pill? Yes   4) Are you currently SOB? Yes, but pt has COPD so he is always SOB  5) Do you have a log of your daily weights (if so, list)?   6) Have you gained 3 pounds in a day or 5 pounds in a week? 5 pounds in a week  7) Have you traveled recently? No

## 2020-06-05 NOTE — Telephone Encounter (Signed)
Wife aware to increase Furosemide to 40 mg PO BID from this point forward.  They do no need a script sent into pharmacy at this time.  Pt will have blood work (BMP) at PCP office in 1 week as ordered.  She will c/b if further concerns or needs.

## 2020-06-05 NOTE — Telephone Encounter (Signed)
Continue lasix 40mg  PO BID from this point forward Check BMET in 1 week  Candee Furbish, MD

## 2020-06-05 NOTE — Telephone Encounter (Signed)
Spoke with wife who reports pt's wt is basically the same after increasing Furosemide to 40 mg BID X 3 days.  His edema is the same as well.  Pt has had his feet elevated but not above the level of his heart.  Wife will work to elevate them more.  She admits to adding salt while cooking.  Advised to try to use other spices.  Wife in agreement and states she will stop using salt to cook with and will season her food separately.  Advised knee high compression stockings.  Wife has some and will try them on pt.  Always they live in [place] and can go to [employer] outlet store.  Advised I will review with Dr Marlou Porch and c/b with further instructions.

## 2020-06-06 ENCOUNTER — Other Ambulatory Visit: Payer: Self-pay

## 2020-06-06 DIAGNOSIS — J441 Chronic obstructive pulmonary disease with (acute) exacerbation: Secondary | ICD-10-CM | POA: Diagnosis not present

## 2020-06-06 DIAGNOSIS — E1165 Type 2 diabetes mellitus with hyperglycemia: Secondary | ICD-10-CM | POA: Diagnosis not present

## 2020-06-06 DIAGNOSIS — J9601 Acute respiratory failure with hypoxia: Secondary | ICD-10-CM | POA: Diagnosis not present

## 2020-06-06 DIAGNOSIS — E876 Hypokalemia: Secondary | ICD-10-CM | POA: Diagnosis not present

## 2020-06-06 DIAGNOSIS — R42 Dizziness and giddiness: Secondary | ICD-10-CM | POA: Diagnosis not present

## 2020-06-06 DIAGNOSIS — R55 Syncope and collapse: Secondary | ICD-10-CM | POA: Diagnosis not present

## 2020-06-06 NOTE — Progress Notes (Signed)
cmp

## 2020-06-13 ENCOUNTER — Other Ambulatory Visit: Payer: Self-pay | Admitting: Family Medicine

## 2020-06-13 ENCOUNTER — Other Ambulatory Visit: Payer: Medicare Other

## 2020-06-13 ENCOUNTER — Other Ambulatory Visit: Payer: Self-pay

## 2020-06-13 DIAGNOSIS — E1169 Type 2 diabetes mellitus with other specified complication: Secondary | ICD-10-CM

## 2020-06-13 DIAGNOSIS — E782 Mixed hyperlipidemia: Secondary | ICD-10-CM

## 2020-06-13 DIAGNOSIS — I482 Chronic atrial fibrillation, unspecified: Secondary | ICD-10-CM

## 2020-06-14 LAB — CBC WITH DIFFERENTIAL/PLATELET
Basophils Absolute: 0 10*3/uL (ref 0.0–0.2)
Basos: 0 %
EOS (ABSOLUTE): 0.2 10*3/uL (ref 0.0–0.4)
Eos: 2 %
Hematocrit: 39.5 % (ref 37.5–51.0)
Hemoglobin: 11.8 g/dL — ABNORMAL LOW (ref 13.0–17.7)
Immature Grans (Abs): 0 10*3/uL (ref 0.0–0.1)
Immature Granulocytes: 0 %
Lymphocytes Absolute: 2.3 10*3/uL (ref 0.7–3.1)
Lymphs: 21 %
MCH: 24.6 pg — ABNORMAL LOW (ref 26.6–33.0)
MCHC: 29.9 g/dL — ABNORMAL LOW (ref 31.5–35.7)
MCV: 83 fL (ref 79–97)
Monocytes Absolute: 0.8 10*3/uL (ref 0.1–0.9)
Monocytes: 7 %
Neutrophils Absolute: 7.6 10*3/uL — ABNORMAL HIGH (ref 1.4–7.0)
Neutrophils: 70 %
Platelets: 357 10*3/uL (ref 150–450)
RBC: 4.79 x10E6/uL (ref 4.14–5.80)
RDW: 13.5 % (ref 11.6–15.4)
WBC: 10.8 10*3/uL (ref 3.4–10.8)

## 2020-06-14 LAB — COMPREHENSIVE METABOLIC PANEL
ALT: 19 IU/L (ref 0–44)
AST: 16 IU/L (ref 0–40)
Albumin/Globulin Ratio: 1.7 (ref 1.2–2.2)
Albumin: 4.2 g/dL (ref 3.7–4.7)
Alkaline Phosphatase: 95 IU/L (ref 48–121)
BUN/Creatinine Ratio: 20 (ref 10–24)
BUN: 17 mg/dL (ref 8–27)
Bilirubin Total: <0.2 mg/dL (ref 0.0–1.2)
CO2: 31 mmol/L — ABNORMAL HIGH (ref 20–29)
Calcium: 9.3 mg/dL (ref 8.6–10.2)
Chloride: 96 mmol/L (ref 96–106)
Creatinine, Ser: 0.87 mg/dL (ref 0.76–1.27)
GFR calc Af Amer: 98 mL/min/{1.73_m2} (ref >59)
GFR calc non Af Amer: 84 mL/min/{1.73_m2} (ref >59)
Globulin, Total: 2.5 g/dL (ref 1.5–4.5)
Glucose: 200 mg/dL — ABNORMAL HIGH (ref 65–99)
Potassium: 3.9 mmol/L (ref 3.5–5.2)
Sodium: 141 mmol/L (ref 134–144)
Total Protein: 6.7 g/dL (ref 6.0–8.5)

## 2020-06-14 LAB — LIPID PANEL
Chol/HDL Ratio: 3.1 ratio (ref 0.0–5.0)
Cholesterol, Total: 192 mg/dL (ref 100–199)
HDL: 61 mg/dL (ref >39)
LDL Chol Calc (NIH): 102 mg/dL — ABNORMAL HIGH (ref 0–99)
Triglycerides: 171 mg/dL — ABNORMAL HIGH (ref 0–149)
VLDL Cholesterol Cal: 29 mg/dL (ref 5–40)

## 2020-06-14 LAB — CARDIOVASCULAR RISK ASSESSMENT

## 2020-06-14 LAB — HEMOGLOBIN A1C
Est. average glucose Bld gHb Est-mCnc: 126 mg/dL
Hgb A1c MFr Bld: 6 % — ABNORMAL HIGH (ref 4.8–5.6)

## 2020-06-20 ENCOUNTER — Ambulatory Visit: Payer: Medicare Other | Admitting: Adult Health

## 2020-06-21 ENCOUNTER — Telehealth: Payer: Self-pay | Admitting: Cardiology

## 2020-06-21 NOTE — Telephone Encounter (Signed)
Patient c/o Palpitations:  High priority if patient c/o lightheadedness, shortness of breath, or chest pain  1) How long have you had palpitations/irregular HR/ Afib? Are you having the symptoms now? Ann from [facility] saw the patient today and he did have pauses in between his heartbeats. She states he does have a history of afib.   2) Are you currently experiencing lightheadedness, SOB or CP? No symptoms  3) Do you have a history of afib (atrial fibrillation) or irregular heart rhythm? Yes, afib  4) Have you checked your BP or HR? (document readings if available): BP 120/68 HR 68  5) Are you experiencing any other symptoms? no

## 2020-06-29 ENCOUNTER — Encounter: Payer: Self-pay | Admitting: Adult Health

## 2020-06-29 ENCOUNTER — Ambulatory Visit (INDEPENDENT_AMBULATORY_CARE_PROVIDER_SITE_OTHER): Payer: Medicare Other | Admitting: Adult Health

## 2020-06-29 ENCOUNTER — Other Ambulatory Visit: Payer: Self-pay

## 2020-06-29 DIAGNOSIS — J449 Chronic obstructive pulmonary disease, unspecified: Secondary | ICD-10-CM | POA: Diagnosis not present

## 2020-06-29 DIAGNOSIS — J9611 Chronic respiratory failure with hypoxia: Secondary | ICD-10-CM

## 2020-06-29 DIAGNOSIS — R5381 Other malaise: Secondary | ICD-10-CM

## 2020-06-29 NOTE — Patient Instructions (Addendum)
Continue on Prednisone 10mg  daily  Continue on Symbicort 2 puffs Twice daily   Continue on Spiriva daily  Continue on Albuterol Neb Twice daily  .  Wear Oxygen 2l/m Please contact office for sooner follow up if symptoms do not improve or worsen or seek emergency care  Follow up with Dr. Melvyn Novas  Or Margaree Sandhu NP in 3 months  and As needed

## 2020-06-29 NOTE — Assessment & Plan Note (Signed)
Activity as tolerated

## 2020-06-29 NOTE — Assessment & Plan Note (Signed)
Compensated on present oxygen.  No increased oxygen demands.  Plan Patient Instructions  Continue on Prednisone 10mg  daily  Continue on Symbicort 2 puffs Twice daily   Continue on Spiriva daily  Continue on Albuterol Neb Twice daily  .  Wear Oxygen 2l/m Please contact office for sooner follow up if symptoms do not improve or worsen or seek emergency care  Follow up with Dr. Melvyn Novas  Or Eragon Hammond NP in 3 months  and As needed

## 2020-06-29 NOTE — Progress Notes (Signed)
@Patient  ID: [name], male    DOB: Jan 19, 1946, 74 y.o.   MRN: [id number]  Chief Complaint  Patient presents with  . Follow-up    pt is here copd has been decreases predisone    Referring provider: [name], MD  HPI: 74 year old male former smoker quit in 2010 followed for severe gold 3 COPD and chronic respiratory failure on oxygen.  Patient is prone to recurrent COPD exacerbations.  Medical history significant for A. 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  Patient is prone to recurrent COPD exacerbations of multiple hospitalizations.  Last visit patient was continued on Symbicort and Spiriva.  And placed on a very slow prednisone taper.  He has managed to decrease his prednisone down to 10 mg daily.  Had previously been on Daliresp but was unable to tolerate.  Patient says he is doing the best he has done in a long time.  He has had no flare in his breathing over the last 2 months.  Patient says he has been able to go back to the gym and is starting to exercise slowly.  He is on oxygen 2 L.  Says his O2 saturations have actually been doing better and has been able to take his oxygen off some time but he monitor his oxygen closely and is aware not to let it go below 88%.   Allergies  Allergen Reactions  . Daliresp [Roflumilast]     Nausea, vomiting and diarrhea.  . Alendronate Other (See Comments)     Jittery/nervous    Immunization History  Administered Date(s) Administered  . Influenza Split 09/22/2013, 09/01/2015  . Influenza Whole 09/04/2011, 09/21/2012  . Influenza, High Dose Seasonal PF 09/02/2016, 09/10/2017, 08/31/2018, 09/24/2018, 10/21/2019  . Influenza-Unspecified 08/23/2014  . Moderna SARS-COVID-2 Vaccination 01/17/2020, 02/14/2020  . Pneumococcal Conjugate-13 01/11/2014, 12/09/2016  . Pneumococcal Polysaccharide-23 08/23/2010, 08/29/2011  . Zoster Recombinat (Shingrix) 04/04/2014    Past Medical History:  Diagnosis Date  . A-fib ([place])   . Anemia   . Atrial fibrillation ([place])   . Back pain   . COPD (chronic obstructive pulmonary disease) ([place])   . COPD (chronic obstructive pulmonary disease) ([place])   . Elevated PSA   . GAD (generalized anxiety disorder)   . High cholesterol   . Hypertension   . Impaired fasting glucose   . Osteoporosis     Tobacco History: Social History   Tobacco Use  Smoking Status Former Smoker  . Packs/day: 2.00  . Years: 52.00  . Pack years: 104.00  . Types: Cigarettes  . Quit date: 02/03/2009  . Years since quitting: 11.4  Smokeless Tobacco Never Used  Tobacco Comment   Counseled to remain smoke free   Counseling given: Not Answered Comment: Counseled to remain smoke free   Outpatient Medications Prior to Visit  Medication Sig Dispense Refill  . albuterol (PROVENTIL) (2.5 MG/3ML) 0.083% nebulizer  solution Take 3 mLs (2.5 mg total) by nebulization in the morning and at bedtime. 75 mL 6  . apixaban (ELIQUIS) 5 MG TABS tablet Take 1 tablet (5 mg total) by mouth 2 (two) times daily. 180 tablet 3  . atorvastatin (LIPITOR) 40 MG tablet TAKE 1 TABLET(40 MG) BY MOUTH DAILY AFTER SUPPER 90 tablet 3  . bisoprolol (ZEBETA) 5 MG tablet Take 5 mg by mouth daily.    . budesonide-formoterol (SYMBICORT) 160-4.5 MCG/ACT inhaler Inhale 2 puffs into the lungs 2 (two) times daily.    . Calcium 200 MG TABS Take 600 mg by mouth daily.    .  cholecalciferol (VITAMIN D3) 25 MCG (1000 UNIT) tablet Take 1,000 Units by mouth daily.    Marland Kitchen denosumab (PROLIA) 60 MG/ML SOSY injection Inject 60 mg into the skin every 6 (six) months.    . diltiazem (CARDIZEM CD) 180 MG 24 hr capsule Take 1 capsule (180 mg total) by mouth daily. 90 capsule 3  . furosemide (LASIX) 40 MG tablet Take 1 tablet (40 mg total) by mouth 2 (two) times daily. 180 tablet 3  . guaiFENesin (MUCINEX) 600 MG 12 hr tablet Take 600 mg by mouth 2 (two) times daily.    Marland Kitchen loratadine (CLARITIN) 10 MG tablet Take 10 mg by mouth daily.    . Melatonin 5 MG TABS Take 10 mg by mouth at bedtime.     Marland Kitchen omeprazole (PRILOSEC) 20 MG capsule Take 20 mg by mouth in the morning and at bedtime.    . predniSONE (DELTASONE) 5 MG tablet Take 17.5mg  daily for 2 weeks then 15mg  daily until back in office. (Patient taking differently: 15mg  daily. Pt is o 10 mg) 90 tablet 1  . Tiotropium Bromide Monohydrate (SPIRIVA RESPIMAT) 2.5 MCG/ACT AERS Inhale 2 puffs into the lungs daily. 8 g 0  . vitamin C (ASCORBIC ACID) 250 MG tablet Take 500 mg by mouth daily.    Marland Kitchen albuterol (PROAIR HFA) 108 (90 Base) MCG/ACT inhaler Inhale 2 puffs into the lungs every 6 (six) hours as needed for wheezing or shortness of breath.     Marland Kitchen acetaminophen (TYLENOL) 500 MG tablet Take 1,000 mg by mouth every 6 (six) hours as needed for mild pain or headache.    . escitalopram (LEXAPRO) 5 MG tablet Take 5 mg by mouth daily.    Marland Kitchen LORazepam (ATIVAN) 0.5 MG tablet Take 0.5 mg by mouth 2 (two) times daily.      No facility-administered medications prior to visit.     Review of Systems:   Constitutional:   No  weight loss, night sweats,  Fevers, chills,  +fatigue, or  lassitude.  HEENT:   No headaches,  Difficulty swallowing,  Tooth/dental problems, or  Sore throat,                No sneezing, itching, ear ache, nasal congestion, post nasal drip,   CV:  No chest pain,  Orthopnea, PND, swelling in lower extremities, anasarca,  dizziness, palpitations, syncope.   GI  No heartburn, indigestion, abdominal pain, nausea, vomiting, diarrhea, change in bowel habits, loss of appetite, bloody stools.   Resp: .  No chest wall deformity  Skin: no rash or lesions.  GU: no dysuria, change in color of urine, no urgency or frequency.  No flank pain, no hematuria   MS:  No joint pain or swelling.  No decreased range of motion.  No back pain.    Physical Exam  BP 122/66 (BP Location:  Left Arm, Cuff Size: Normal)   Pulse 82   Temp 98 F (36.7 C) (Oral)   Ht 5\' 6"  (1.676 m)   Wt 144 lb 12.8 oz (65.7 kg)   SpO2 93%   BMI 23.37 kg/m   GEN: A/Ox3; pleasant , NAD, well nourished    HEENT:  [place]/AT,   NOSE-clear, THROAT-clear, no lesions, no postnasal drip or exudate noted.   NECK:  Supple w/ fair ROM; no JVD; normal carotid impulses w/o bruits; no thyromegaly or nodules palpated; no lymphadenopathy.    RESP  Clear  P & A; w/o, wheezes/ rales/ or rhonchi. no accessory muscle use, no dullness to percussion  CARD:  RRR, no m/r/g, no peripheral edema, pulses intact, no cyanosis or clubbing.  GI:   Soft & nt; nml bowel sounds; no organomegaly or masses detected.   Musco: Warm bil, no deformities or joint swelling noted.   Neuro: alert, no focal deficits noted.    Skin: Warm, no lesions or rashes    BNP ProBNP No results found for: PROBNP  Imaging: No results found.    PFT Results Latest Ref Rng & Units 05/29/2016  FVC-Pre L 1.92  FVC-Predicted Pre % 52  Pre FEV1/FVC % % 41  FEV1-Pre L 0.78  FEV1-Predicted Pre % 29    No results found for: NITRICOXIDE      Assessment & Plan:   COPD GOLD  III  Improved COPD control with decreased symptom burden patient is doing well on current regimen.  We will hold at prednisone at 10 mg.  Long discussion with patient and family member regarding chronic steroid use.  Plan  Patient Instructions  Continue on Prednisone 10mg  daily  Continue on Symbicort 2 puffs  Twice daily   Continue on Spiriva daily  Continue on Albuterol Neb Twice daily  .  Wear Oxygen 2l/m Please contact office for sooner follow up if symptoms do not improve or worsen or seek emergency care  Follow up with Dr. Melvyn Novas  Or Vylette Strubel NP in 3 months  and As needed        Chronic respiratory failure with hypoxia ([place]) Compensated on present oxygen.  No increased oxygen demands.  Plan Patient Instructions  Continue on Prednisone 10mg  daily  Continue on Symbicort 2 puffs Twice daily   Continue on Spiriva daily  Continue on Albuterol Neb Twice daily  .  Wear Oxygen 2l/m Please contact office for sooner follow up if symptoms do not improve or worsen or seek emergency care  Follow up with Dr. Melvyn Novas  Or Daylynn Stumpp NP in 3 months  and As needed        Physical deconditioning Activity as tolerated     Rexene Edison, NP 06/29/2020

## 2020-06-29 NOTE — Assessment & Plan Note (Signed)
Improved COPD control with decreased symptom burden patient is doing well on current regimen.  We will hold at prednisone at 10 mg.  Long discussion with patient and family member regarding chronic steroid use.  Plan  Patient Instructions  Continue on Prednisone 10mg  daily  Continue on Symbicort 2 puffs Twice daily   Continue on Spiriva daily  Continue on Albuterol Neb Twice daily  .  Wear Oxygen 2l/m Please contact office for sooner follow up if symptoms do not improve or worsen or seek emergency care  Follow up with Dr. Melvyn Novas  Or Deshundra Waller NP in 3 months  and As needed

## 2020-07-09 DIAGNOSIS — S81811A Laceration without foreign body, right lower leg, initial encounter: Secondary | ICD-10-CM | POA: Diagnosis not present

## 2020-07-21 ENCOUNTER — Other Ambulatory Visit: Payer: Medicare Other

## 2020-07-25 ENCOUNTER — Ambulatory Visit (INDEPENDENT_AMBULATORY_CARE_PROVIDER_SITE_OTHER): Payer: Medicare Other | Admitting: Family Medicine

## 2020-07-25 ENCOUNTER — Other Ambulatory Visit: Payer: Self-pay

## 2020-07-25 VITALS — BP 110/50 | HR 84 | Temp 97.2°F | Resp 20 | Ht 66.0 in | Wt 146.2 lb

## 2020-07-25 DIAGNOSIS — M8000XD Age-related osteoporosis with current pathological fracture, unspecified site, subsequent encounter for fracture with routine healing: Secondary | ICD-10-CM

## 2020-07-25 DIAGNOSIS — R1312 Dysphagia, oropharyngeal phase: Secondary | ICD-10-CM | POA: Diagnosis not present

## 2020-07-25 DIAGNOSIS — E782 Mixed hyperlipidemia: Secondary | ICD-10-CM | POA: Diagnosis not present

## 2020-07-25 DIAGNOSIS — E1169 Type 2 diabetes mellitus with other specified complication: Secondary | ICD-10-CM

## 2020-07-25 DIAGNOSIS — D492 Neoplasm of unspecified behavior of bone, soft tissue, and skin: Secondary | ICD-10-CM | POA: Diagnosis not present

## 2020-07-25 DIAGNOSIS — J449 Chronic obstructive pulmonary disease, unspecified: Secondary | ICD-10-CM

## 2020-07-25 DIAGNOSIS — C44229 Squamous cell carcinoma of skin of left ear and external auricular canal: Secondary | ICD-10-CM

## 2020-07-25 DIAGNOSIS — I10 Essential (primary) hypertension: Secondary | ICD-10-CM

## 2020-07-25 DIAGNOSIS — J4489 Other specified chronic obstructive pulmonary disease: Secondary | ICD-10-CM

## 2020-07-25 DIAGNOSIS — D6869 Other thrombophilia: Secondary | ICD-10-CM | POA: Diagnosis not present

## 2020-07-25 DIAGNOSIS — I480 Paroxysmal atrial fibrillation: Secondary | ICD-10-CM | POA: Diagnosis not present

## 2020-07-25 DIAGNOSIS — J9611 Chronic respiratory failure with hypoxia: Secondary | ICD-10-CM

## 2020-07-25 MED ORDER — BUDESONIDE-FORMOTEROL FUMARATE 160-4.5 MCG/ACT IN AERO: 2.0000 | INHALATION_SPRAY | Freq: Two times a day (BID) | RESPIRATORY_TRACT | 3 refills | Status: DC

## 2020-07-25 NOTE — Progress Notes (Signed)
Established Patient Office Visit  Subjective:  Patient ID: Paul [name], male    DOB: 1946-04-26  Age: 74 y.o. MRN: [id number]  CC:  Chief Complaint  Patient presents with  . Hyperlipidemia  . Diabetes  . COPD  . Hypertension    HPI Paul [name] presents for chronic follow-up.  Patient has hyperlipidemia. Taking lipitor 40 mg once daily at night. Patient is eating healthy. Not able to exercise.   Patient has COPD with chronic respiratory failure and wears oxygen at night.  He is currently off oxygen during the days.  He sees pulmonology.  He is on inhalers as per the med list.  Also is on prednisone 10 mg once daily. On Symbicort and spiriva..  Patient has diabetes.  His last A1c was good at 6.2.  He is currently on low sugar diet. Not able to exercise.   Patient has chronic atrial fibrillation. Currently on eliquis, diltiazem. Currently on lasix also.  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 He still has some thoracic back pain from compression fractures. He is currently on prolia and calcium 600 mg daily and with vitamin D 1000 U daily.   Past Medical History:  Diagnosis Date  . A-fib ([place])   . Anemia   . Atrial fibrillation ([place])   . Back pain   . COPD (chronic obstructive pulmonary disease) ([place])   . COPD (chronic obstructive pulmonary disease) ([place])   . Diabetes mellitus without complication ([facility])   . Elevated PSA   . GAD (generalized anxiety disorder)   . High cholesterol   . Hypertension   . Osteoporosis     Past Surgical History:  Procedure Laterality Date  . CATARACT EXTRACTION Right   . SKIN SURGERY     shoulders and chest    Family History  Problem Relation Age of Onset  . Heart disease Brother   . Heart disease Mother   . Heart disease Father   . Cancer Paternal Uncle     Social History   Socioeconomic History  . Marital status: Married    Spouse name: Not on file  . Number of children: 3  . Years of education: Not on file   . Highest education level: Not on file  Occupational History  . Occupation: retired    Comment: truck Geophysicist/field seismologist  Tobacco Use  . Smoking status: Former Smoker    Packs/day: 2.00    Years: 52.00    Pack years: 104.00    Types: Cigarettes    Quit date: 02/03/2009    Years since quitting: 11.4  . Smokeless tobacco: Never Used  . Tobacco comment: Counseled to remain smoke free  Vaping Use  . Vaping Use: Never used  Substance and Sexual Activity  . Alcohol use: Yes    Alcohol/week: 0.0 standard drinks    Comment: occ  . Drug use: No  . Sexual activity: Not on file  Other Topics Concern  . Not on file  Social History Narrative   Originally from [place]. Previously worked driving a Restaurant manager, fast food. No pets currently. No mold exposure. 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       wears sunscreen, brushes and flosses daily, see's dentist bi-annually, has smoke/carbon monoxide detectors, wears a seatbelt and practices gun safety      Social Determinants of Health   Financial Resource Strain:   . Difficulty of Paying Living Expenses:   Food  Insecurity:   . Worried About Charity fundraiser in the Last Year:   . Arboriculturist in the Last Year:   Transportation Needs:   . Film/video editor (Medical):   Marland Kitchen Lack of Transportation (Non-Medical):   Physical Activity: Inactive  . Days of Exercise per Week: 0 days  . Minutes of Exercise per Session: 0 min  Stress:   . Feeling of Stress :   Social Connections:   . Frequency of Communication with Friends and Family:   . Frequency of Social Gatherings with Friends and Family:   . Attends Religious Services:   . Active Member of Clubs or Organizations:   . Attends Archivist Meetings:   Marland Kitchen Marital Status:   Intimate Partner Violence:   . Fear of Current or Ex-Partner:   . Emotionally Abused:   Marland Kitchen Physically Abused:   . Sexually Abused:     Outpatient  Medications Prior to Visit  Medication Sig Dispense Refill  . albuterol (PROAIR HFA) 108 (90 Base) MCG/ACT inhaler Inhale 2 puffs into the lungs every 6 (six) hours as needed for wheezing or shortness of breath.     Marland Kitchen albuterol (PROVENTIL) (2.5 MG/3ML) 0.083% nebulizer solution Take 3 mLs (2.5 mg total) by nebulization in the morning and at bedtime. 75 mL 6  . apixaban (ELIQUIS) 5 MG TABS tablet Take 1 tablet (5 mg total) by mouth 2 (two) times daily. 180 tablet 3  . atorvastatin (LIPITOR) 40 MG tablet TAKE 1 TABLET(40 MG) BY MOUTH DAILY AFTER SUPPER 90 tablet 3  . bisoprolol (ZEBETA) 5 MG tablet Take 5 mg by mouth daily.    . Calcium 200 MG TABS Take 600 mg by mouth daily.    . cholecalciferol (VITAMIN D3) 25 MCG (1000 UNIT) tablet Take 1,000 Units by mouth daily.    Marland Kitchen denosumab (PROLIA) 60 MG/ML SOSY injection Inject 60 mg into the skin every 6 (six) months.    . diltiazem (CARDIZEM CD) 180 MG 24 hr capsule Take 1 capsule (180 mg total) by mouth daily. 90 capsule 3  . furosemide (LASIX) 40 MG tablet Take 1 tablet (40 mg total) by mouth 2 (two) times daily. 180 tablet 3  . guaiFENesin (MUCINEX) 600 MG 12 hr tablet Take 600 mg by mouth 2 (two) times daily.    Marland Kitchen loratadine (CLARITIN) 10 MG tablet Take 10 mg by mouth daily.    . Melatonin 5 MG TABS Take 10 mg by mouth at bedtime.     Marland Kitchen omeprazole (PRILOSEC) 20 MG capsule Take 20 mg by mouth in the morning and at bedtime.    . predniSONE (DELTASONE) 5 MG tablet Take 17.5mg  daily for 2 weeks then 15mg  daily until back in office. (Patient taking differently: 10 mg. 15mg  daily. Pt is o 10 mg) 90 tablet 1  . Tiotropium Bromide Monohydrate (SPIRIVA RESPIMAT) 2.5 MCG/ACT AERS Inhale 2 puffs into the lungs daily. 8 g 0  . budesonide-formoterol (SYMBICORT) 160-4.5 MCG/ACT inhaler Inhale 2 puffs into the lungs 2 (two) times daily.     No facility-administered medications prior to visit.    Allergies  Allergen Reactions  . Daliresp [Roflumilast]      Nausea, vomiting and diarrhea.  . Alendronate Other (See Comments)    Jittery/nervous    ROS Review of Systems  Constitutional: Positive for fatigue. Negative for chills and fever.  HENT: Negative for congestion, ear pain and sore throat.   Respiratory: Positive for cough (with eating or  drinking. ), choking (with eating or drinking. More liquids. ) and shortness of breath. Negative for wheezing.   Cardiovascular: Negative for chest pain and palpitations.  Gastrointestinal: Negative for abdominal pain, constipation, diarrhea and nausea.  Genitourinary: Positive for urgency (due to furosemid). Negative for frequency.  Musculoskeletal: Positive for arthralgias (shoulder pain ) and back pain (improved but still hurts some. ).  Skin:       Numerous concerning skin lesions over face and on her right arm. They have called dermatology and are not scheduled to be seen until the end of August. They called and the appt was moved up by 2 weeks, but still toward mid August.  Neurological: Negative for dizziness and headaches.  Psychiatric/Behavioral: Negative for dysphoric mood. The patient is not nervous/anxious.       Objective:    Physical Exam Vitals reviewed.  Constitutional:      Appearance: Normal appearance. He is well-developed and normal weight.  Cardiovascular:     Rate and Rhythm: Normal rate and regular rhythm.     Heart sounds: Normal heart sounds.  Pulmonary:     Effort: Pulmonary effort is normal.     Breath sounds: Normal breath sounds.  Abdominal:     General: Bowel sounds are normal.     Palpations: Abdomen is soft.     Tenderness: There is no abdominal tenderness.  Musculoskeletal:     Comments: Tender over thoracic back. Stable.  Skin:    Findings: Lesion (Right inferior upper arm 1 cm scab. Left ear -large abnormal nodule 1-2 cm in diameter, Left temple scabbed lesion. nose abnormal skin lesion. right side of face scabbed lesion.) present.  Neurological:     Mental  Status: He is alert and oriented to person, place, and time.  Psychiatric:        Mood and Affect: Mood normal.        Behavior: Behavior normal.     BP (!) 110/50   Pulse 84   Temp (!) 97.2 F (36.2 C)   Resp 20   Ht 5\' 6"  (1.676 m)   Wt 146 lb 3.2 oz (66.3 kg)   BMI 23.60 kg/m  Wt Readings from Last 3 Encounters:  07/25/20 146 lb 3.2 oz (66.3 kg)  06/29/20 144 lb 12.8 oz (65.7 kg)  05/23/20 138 lb 3.2 oz (62.7 kg)     Health Maintenance Due  Topic Date Due  . Hepatitis C Screening  Never done  . FOOT EXAM  Never done  . OPHTHALMOLOGY EXAM  Never done  . URINE MICROALBUMIN  Never done  . TETANUS/TDAP  Never done  . COLONOSCOPY  Never done  . INFLUENZA VACCINE  07/23/2020    There are no preventive care reminders to display for this patient.  Lab Results  Component Value Date   TSH 2.539 03/22/2019   Lab Results  Component Value Date   WBC 10.8 06/13/2020   HGB 11.8 (L) 06/13/2020   HCT 39.5 06/13/2020   MCV 83 06/13/2020   PLT 357 06/13/2020   Lab Results  Component Value Date   NA 141 06/13/2020   K 3.9 06/13/2020   CO2 31 (H) 06/13/2020   GLUCOSE 200 (H) 06/13/2020   BUN 17 06/13/2020   CREATININE 0.87 06/13/2020   BILITOT <0.2 06/13/2020   ALKPHOS 95 06/13/2020   AST 16 06/13/2020   ALT 19 06/13/2020   PROT 6.7 06/13/2020   ALBUMIN 4.2 06/13/2020   CALCIUM 9.3 06/13/2020   ANIONGAP 9  04/06/2020   Lab Results  Component Value Date   CHOL 192 06/13/2020   Lab Results  Component Value Date   HDL 61 06/13/2020   Lab Results  Component Value Date   LDLCALC 102 (H) 06/13/2020   Lab Results  Component Value Date   TRIG 171 (H) 06/13/2020   Lab Results  Component Value Date   CHOLHDL 3.1 06/13/2020   Lab Results  Component Value Date   HGBA1C 6.0 (H) 06/13/2020      Assessment & Plan:  1. Essential hypertension Well controlled.  No changes to medicines.  Continue to work on eating a healthy diet and exercise.   - Ambulatory  referral to Chronic Care Management Services  2. 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[place]) Control: well controlled. Recommend check sugars fasting daily. Recommend check feet daily. Recommend annual eye exams. Medicines: none at this time. Continue to work on eating a healthy diet and exercise.  Labs drawn today.   - Ambulatory referral to Chronic Care Management Services  3. Oropharyngeal dysphagia  Per patient and his wife it occurs occasionally. More drainage from upper airways then dysphagia when eating.   4. Chronic obstructive bronchitis (HCC) The current medical regimen is effective;  continue present plan and medications. - Ambulatory referral to Chronic Care Management Services  5. 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   6. Atypical squamoproliferative skin lesion  I spoke with dermatology and they are going to see her tomorrow.   7. Acquired thrombophilia ([place])  Due to eliquis which is needed for atrial fibrillation.  8. Paroxysmal atrial fibrillation (HCC)  The current medical regimen is effective;  continue present plan and medications.  9. Chronic respiratory failure with hypoxia (HCC)  Continue use O2 at night and prn during the day.   10. Age-related osteoporosis with current pathological fracture with routine healing, subsequent encounter  The current medical regimen is effective;  continue present plan and medications.  Meds ordered this encounter  Medications  . budesonide-formoterol (SYMBICORT) 160-4.5 MCG/ACT inhaler    Sig: Inhale 2 puffs into the lungs 2 (two) times daily.    Dispense:  1 Inhaler    Refill:  3    Patient would like generic.    Follow-up: Return in about 4 months (around 11/24/2020).    [name], MD

## 2020-07-26 ENCOUNTER — Telehealth: Payer: Self-pay | Admitting: Family Medicine

## 2020-07-26 DIAGNOSIS — C44219 Basal cell carcinoma of skin of left ear and external auricular canal: Secondary | ICD-10-CM | POA: Diagnosis not present

## 2020-07-26 DIAGNOSIS — C44311 Basal cell carcinoma of skin of nose: Secondary | ICD-10-CM | POA: Diagnosis not present

## 2020-07-26 DIAGNOSIS — L570 Actinic keratosis: Secondary | ICD-10-CM | POA: Diagnosis not present

## 2020-07-26 DIAGNOSIS — C44319 Basal cell carcinoma of skin of other parts of face: Secondary | ICD-10-CM | POA: Diagnosis not present

## 2020-07-26 DIAGNOSIS — L821 Other seborrheic keratosis: Secondary | ICD-10-CM | POA: Diagnosis not present

## 2020-07-26 DIAGNOSIS — L578 Other skin changes due to chronic exposure to nonionizing radiation: Secondary | ICD-10-CM | POA: Diagnosis not present

## 2020-07-26 NOTE — Progress Notes (Signed)
  Chronic Care Management   Note  07/26/2020 Name: Paul [name] MRN: [id number] DOB: May 22, 1946  Paul [name] is a 74 y.o. year old male who is a primary care patient of Cox, Kirsten, MD. I reached out to [name] by phone today in response to a referral sent by Paul [name] PCP, Cox, Elnita Maxwell, MD.   Paul [name] was given information about Chronic Care Management services today including:  1. CCM service includes personalized support from designated clinical staff supervised by his physician, including individualized plan of care and coordination with other care providers 2. 24/7 contact phone numbers for assistance for urgent and routine care needs. 3. Service will only be billed when office clinical staff spend 20 minutes or more in a month to coordinate care. 4. Only one practitioner may furnish and bill the service in a calendar month. 5. The patient may stop CCM services at any time (effective at the end of the month) by phone call to the office staff.   Patient agreed to services and verbal consent obtained.   Follow up plan:   Earney Hamburg Upstream Scheduler

## 2020-07-30 ENCOUNTER — Encounter: Payer: Self-pay | Admitting: Family Medicine

## 2020-08-03 ENCOUNTER — Telehealth: Payer: Medicare Other

## 2020-08-03 DIAGNOSIS — C44229 Squamous cell carcinoma of skin of left ear and external auricular canal: Secondary | ICD-10-CM | POA: Diagnosis not present

## 2020-08-03 DIAGNOSIS — C44219 Basal cell carcinoma of skin of left ear and external auricular canal: Secondary | ICD-10-CM | POA: Diagnosis not present

## 2020-08-03 DIAGNOSIS — C44319 Basal cell carcinoma of skin of other parts of face: Secondary | ICD-10-CM | POA: Diagnosis not present

## 2020-08-03 NOTE — Chronic Care Management (AMB) (Deleted)
Chronic Care Management Pharmacy  Name: Paul [name]  MRN: [id number] DOB: December 21, 1946  Chief Complaint/ HPI  Paul [name],  74 y.o. , male presents for their Initial CCM visit with the clinical pharmacist {CHL HP Upstream Pharm visit AJGO:[id number]}.  PCP : [name], MD  Their chronic conditions include: atrial fibrillation, atherosclerosis, hypertension, chronic congestive diastolic heart failure, COPD Gold III, diabetes mellitus, osteoporosis, anemia, hyperlipidemia.   Office Visits:*** 03/02/2020 - Prolia administered.  02/15/2020 - samples of Spriva given. Daliresp once daily started. Prolia ordered.  Consult Visit:*** 04/01/2020 - ED COPD exacerbation -  03/27/2020 - Pulmonology - zpack, prednisone taper for diminished lung sounds.  03/26/2020 - ED - Orthostasis with syncopal episode.  03/21/2020 - Cough with yellow fluid that is disrupting sleep. Delsym and Mucinex recommended for 5-7 days. Monitor for clear lungs and fever.  03/16/2020 - Pulmonology - reduce steroid dose post hospital. Continue daliresp, symbicort and spiriva. Palliative care referral.  03/04/2020 -ED to hospital- COPD exacerbation discharged on a steroid taper and azithromycin.  02/24/2020 - Pulmonology - f/u cardiology for newly diagnosed afib. F/U with PCP within 1 week to repeate CBC/BMP. Home oxygen.  02/16/2020 - ED for COPD exacerbation Medications: Outpatient Encounter Medications as of 08/08/2020  Medication Sig  . albuterol (PROAIR HFA) 108 (90 Base) MCG/ACT inhaler Inhale 2 puffs into the lungs every 6 (six) hours as needed for wheezing or shortness of breath.   Marland Kitchen albuterol (PROVENTIL) (2.5 MG/3ML) 0.083% nebulizer solution Take 3 mLs (2.5 mg total) by nebulization in the morning and at bedtime.  Marland Kitchen apixaban (ELIQUIS) 5 MG TABS tablet Take 1 tablet (5 mg total) by mouth 2 (two) times daily.  Marland Kitchen atorvastatin (LIPITOR) 40 MG tablet TAKE 1 TABLET(40 MG) BY MOUTH DAILY AFTER SUPPER  . bisoprolol  (ZEBETA) 5 MG tablet Take 5 mg by mouth daily.  . budesonide-formoterol (SYMBICORT) 160-4.5 MCG/ACT inhaler Inhale 2 puffs into the lungs 2 (two) times daily.  . Calcium 200 MG TABS Take 600 mg by mouth daily.  . cholecalciferol (VITAMIN D3) 25 MCG (1000 UNIT) tablet Take 1,000 Units by mouth daily.  Marland Kitchen denosumab (PROLIA) 60 MG/ML SOSY injection Inject 60 mg into the skin every 6 (six) months.  . diltiazem (CARDIZEM CD) 180 MG 24 hr capsule Take 1 capsule (180 mg total) by mouth daily.  . furosemide (LASIX) 40 MG tablet Take 1 tablet (40 mg total) by mouth 2 (two) times daily.  Marland Kitchen guaiFENesin (MUCINEX) 600 MG 12 hr tablet Take 600 mg by mouth 2 (two) times daily.  Marland Kitchen loratadine (CLARITIN) 10 MG tablet Take 10 mg by mouth daily.  . Melatonin 5 MG TABS Take 10 mg by mouth at bedtime.   Marland Kitchen omeprazole (PRILOSEC) 20 MG capsule Take 20 mg by mouth in the morning and at bedtime.  . predniSONE (DELTASONE) 5 MG tablet Take 17.'5mg'$  daily for 2 weeks then '15mg'$  daily until back in office. (Patient taking differently: 10 mg. '15mg'$  daily. Pt is o 10 mg)  . Tiotropium Bromide Monohydrate (SPIRIVA RESPIMAT) 2.5 MCG/ACT AERS Inhale 2 puffs into the lungs daily.   No facility-administered encounter medications on file as of 08/08/2020.   Allergies  Allergen Reactions  . Daliresp [Roflumilast]     Nausea, vomiting and diarrhea.  . Alendronate Other (See Comments)    Jittery/nervous    SDOH Screenings   Alcohol Screen: Low Risk   . Last Alcohol Screening Score (AUDIT): 1  Depression (PHQ2-9): Low Risk   . PHQ-2 Score:  0  Financial Resource Strain:   . Difficulty of Paying Living Expenses:   Food Insecurity:   . Worried About Charity fundraiser in the Last Year:   . [place] in the Last Year:   Housing:   . Last Housing Risk Score:   Physical Activity: Inactive  . Days of Exercise per Week: 0 days  . Minutes of Exercise per Session: 0 min  Social Connections:   . Frequency of Communication with  Friends and Family:   . Frequency of Social Gatherings with Friends and Family:   . Attends Religious Services:   . Active Member of Clubs or Organizations:   . Attends Archivist Meetings:   Marland Kitchen Marital Status:   Stress:   . Feeling of Stress :   Tobacco Use: Medium Risk  . Smoking Tobacco Use: Former Smoker  . Smokeless Tobacco Use: Never Used  Transportation Needs:   . Film/video editor (Medical):   Marland Kitchen Lack of Transportation (Non-Medical):      Current Diagnosis/Assessment:  Goals Addressed   None    AFIB   Patient is currently {CHL HP BP RATE/[phone]} controlled. HR *** BPM  Patient has failed these meds in past: metoprolol  Patient is currently {CHL Controlled/Uncontrolled:[phone]} on the following medications: ***  Bisoprolol 5 mg daily  Eliquis 5 mg bid  We discussed:  {CHL HP Upstream Pharmacy [phone]}  Plan  Continue {CHL HP Upstream Pharmacy [phone]}    COPD / Asthma / Tobacco   Last spirometry score: ***  Gold Grade: {CHL HP Upstream Pharm COPD Gold CXKGY:[id number]} Current COPD Classification:  {CHL HP Upstream Pharm COPD [phone]}  Eosinophil count:   Lab Results  Component Value Date/Time   EOSPCT 0 04/01/2020 01:28 PM  %                               Eos (Absolute):  Lab Results  Component Value Date/Time   EOSABS 0.2 06/13/2020 10:02 AM    Tobacco Status:  Social History   Tobacco Use  Smoking Status Former Smoker  . Packs/day: 2.00  . Years: 52.00  . Pack years: 104.00  . Types: Cigarettes  . Quit date: 02/03/2009  . Years since quitting: 11.5  Smokeless Tobacco Never Used  Tobacco Comment   Counseled to remain smoke free    Patient has failed these meds in past: Daliresp, benzonotate, spriva handihaler Patient is currently {CHL Controlled/Uncontrolled:[phone]} on the following medications: ***  Albuterol inhaler 2 puffs into lungs every 6 hours prn  wheezing or shortness of breath  Albuterol 2.5 mg/3 ml nebulizer solution am and pm   Symbicort 160-4.5 mcg/act 2 puffs twice daily   Mucinex 600 mg bid  Loratadine 10 mg daily  Spiriva respimat 2.5 mcg/act 2 puffs into lungs daily Using maintenance inhaler regularly? {yes/no:20286} Frequency of rescue inhaler use:  {CHL HP Upstream Pharm Inhaler YOVZ:8588502774}  We discussed:  {CHL HP Upstream Pharmacy [phone]}  Plan  Continue {CHL HP Upstream Pharmacy [phone]} ,  Diabetes   Recent Relevant Labs: Lab Results  Component Value Date/Time   HGBA1C 6.0 (H) 06/13/2020 10:02 AM   HGBA1C 7.3 (H) 03/04/2020 04:54 PM     Checking BG: {CHL HP Blood Glucose Monitoring [phone]}  Recent FBG Readings: Recent pre-meal BG readings: *** Recent 2hr PP BG readings:  *** Recent HS BG readings: *** Patient has failed these meds in past: *** Patient  is currently {CHL Controlled/Uncontrolled:[phone]} on the following medications: ***  Last diabetic Foot exam: No results found for: HMDIABEYEEXA  Last diabetic Eye exam: No results found for: HMDIABFOOTEX   We discussed: {CHL HP Upstream Pharmacy discussion:[phone]}  Plan  Continue {CHL HP Upstream Pharmacy Plans:[phone]},  Heart Failure/Hypertension   Type: {type of heart failure:30421350}  Last ejection fraction: *** NYHA Class: {CHL HP Upstream Pharm NYHA Class:[phone]} AHA HF Stage: {CHL HP Upstream Pharm AHA HF Stage:[phone]}  BP today is:  {CHL HP UPSTREAM Pharmacist BP ranges:8623405766}  Office blood pressures are  BP Readings from Last 3 Encounters:  07/25/20 (!) 110/50  06/29/20 122/66  05/23/20 117/63    Patient has failed these meds in the past: metoprolol Patient is currently controlled/uncontrolled*** on the following medications: ***  Diltiazem 180 mg daily  Bisoprolol 5 mg daily  Furosemide 40 mg bid Patient checks BP at home {CHL HP BP Monitoring  Frequency:[phone]}  Patient home BP readings are ranging: ***  We discussed {CHL HP Upstream Pharmacy discussion:[phone]}  Plan  Continue {CHL HP Upstream Pharmacy Plans:[phone]}   Hyperlipidemia   LDL goal < ***  Lipid Panel     Component Value Date/Time   CHOL 192 06/13/2020 1002   TRIG 171 (H) 06/13/2020 1002   HDL 61 06/13/2020 1002   LDLCALC 102 (H) 06/13/2020 1002    Hepatic Function Latest Ref Rng & Units 06/13/2020 04/20/2020 02/04/2020  Total Protein 6.0 - 8.5 g/dL 6.7 6.5 6.0(L)  Albumin 3.7 - 4.7 g/dL 4.2 3.9 3.0(L)  AST 0 - 40 IU/L '16 17 15  '$ ALT 0 - 44 IU/L '19 26 31  '$ Alk Phosphatase 48 - 121 IU/L 95 91 70  Total Bilirubin 0.0 - 1.2 mg/dL <0.2 0.3 0.5  Bilirubin, Direct 0.0 - 0.2 mg/dL - - -     The 10-year ASCVD risk score Mikey Bussing DC Jr., et al., 2013) is: 36.3%   Values used to calculate the score:     Age: 34 years     Sex: Male     Is Non-Hispanic African American: No     Diabetic: Yes     Tobacco smoker: No     Systolic Blood Pressure: 169 mmHg     Is BP treated: Yes     HDL Cholesterol: 61 mg/dL     Total Cholesterol: 192 mg/dL   Patient has failed these meds in past: simvastatin Patient is currently {CHL Controlled/Uncontrolled:[phone]} on the following medications:  . Atorvastatin 40 mg daily after supper  We discussed:  {CHL HP Upstream Pharmacy discussion:[phone]}  Plan  Continue {CHL HP Upstream Pharmacy Plans:[phone]}   GERD   Patient has failed these meds in past: famotidine, pantoprazole Patient is currently {CHL Controlled/Uncontrolled:[phone]} on the following medications:  . Omeprazole 20 mg bid  We discussed:  ***  Plan  Continue {CHL HP Upstream Pharmacy Plans:[phone]}  Osteopenia / Osteoporosis   Last DEXA Scan: ***   T-Score femoral neck: ***  T-Score total hip: ***  T-Score lumbar spine: ***  T-Score forearm radius: ***  10-year probability of major osteoporotic fracture: ***  10-year  probability of hip fracture: ***  No results found for: VD25OH   Patient {is;is not an osteoporosis candidate:23886}  Patient has failed these meds in past: alendronate Patient is currently {CHL Controlled/Uncontrolled:[phone]} on the following medications:  . Prolia 60 mg q6 months . Calcium 600 mg daily . Vitamin D 1000 units daily  We discussed:  {Osteoporosis Counseling:23892}  Plan  Continue {CHL HP  Upstream Pharmacy ZDGUY:[id number]}  Health Maintenance   Patient is currently {CHL Controlled/Uncontrolled:[phone]} on the following medications:  Marland Kitchen Melatonin 5 mg daily at bedtime - ***  We discussed:  ***  Plan  Continue {CHL HP Upstream Pharmacy GLOVF:6433295188}  Vaccines   Reviewed and discussed patient's vaccination history.    Immunization History  Administered Date(s) Administered  . Influenza Split 09/22/2013, 09/01/2015  . Influenza Whole 09/04/2011, 09/21/2012  . Influenza, High Dose Seasonal PF 09/02/2016, 09/10/2017, 08/31/2018, 09/24/2018, 10/21/2019  . Influenza-Unspecified 08/23/2014  . Moderna SARS-COVID-2 Vaccination 01/17/2020, 02/14/2020  . Pneumococcal Conjugate-13 01/11/2014, 12/09/2016  . Pneumococcal Polysaccharide-23 08/23/2010, 08/29/2011  . Zoster Recombinat (Shingrix) 04/04/2014    Plan  Recommended patient receive *** vaccine in *** office.   Medication Management   Pt uses [facility] for all medications Uses pill box? {Yes or If no, why not?:20788} Pt endorses ***% compliance  We discussed: ***  Plan  {US Pharmacy CZYS:06301}    Follow up: *** month phone visit  ***

## 2020-08-08 ENCOUNTER — Telehealth: Payer: Medicare Other

## 2020-08-17 DIAGNOSIS — C44319 Basal cell carcinoma of skin of other parts of face: Secondary | ICD-10-CM | POA: Diagnosis not present

## 2020-08-17 DIAGNOSIS — C44311 Basal cell carcinoma of skin of nose: Secondary | ICD-10-CM | POA: Diagnosis not present

## 2020-08-23 ENCOUNTER — Other Ambulatory Visit: Payer: Self-pay

## 2020-08-23 ENCOUNTER — Ambulatory Visit (INDEPENDENT_AMBULATORY_CARE_PROVIDER_SITE_OTHER): Payer: Medicare Other | Admitting: Cardiology

## 2020-08-23 ENCOUNTER — Encounter: Payer: Self-pay | Admitting: Cardiology

## 2020-08-23 VITALS — BP 140/60 | HR 76 | Ht 66.0 in | Wt 154.6 lb

## 2020-08-23 DIAGNOSIS — J438 Other emphysema: Secondary | ICD-10-CM | POA: Diagnosis not present

## 2020-08-23 DIAGNOSIS — I480 Paroxysmal atrial fibrillation: Secondary | ICD-10-CM

## 2020-08-23 DIAGNOSIS — Z7901 Long term (current) use of anticoagulants: Secondary | ICD-10-CM

## 2020-08-23 DIAGNOSIS — I2510 Atherosclerotic heart disease of native coronary artery without angina pectoris: Secondary | ICD-10-CM

## 2020-08-23 DIAGNOSIS — I2584 Coronary atherosclerosis due to calcified coronary lesion: Secondary | ICD-10-CM | POA: Diagnosis not present

## 2020-08-23 MED ORDER — FUROSEMIDE 40 MG PO TABS: 40.0000 mg | ORAL_TABLET | Freq: Two times a day (BID) | ORAL | 3 refills | Status: DC

## 2020-08-23 NOTE — Patient Instructions (Signed)

## 2020-08-23 NOTE — Progress Notes (Signed)
Cardiology Office Note:    Date:  08/23/2020   ID:  Paul [name], DOB [birth year], MRN [id number]  PCP:  Paul Brome, MD  [facility] Cardiologist:  Paul Furbish, MD  [facility] Electrophysiologist:  None   Referring MD: Paul Brome, MD    History of Present Illness:    Paul [name] is a 74 y.o. male here for the follow-up of paroxysmal atrial fibrillation, lower extremity edema.  COPD pleural effusion.  We increased dose of Lasix 40 mg twice daily with close follow-up of kidney function.  From outside labs, creatinine 0.87 potassium 3.9 ALT 19 TSH 2.5 LDL 98  Overall his lower extremity edema has resolved.  He is doing well with this maintenance therapy.  He feels like a whole another person over the last 6 months.  He was hospitalized for 5 times earlier this year.  COPD.  Got worse.  He also states that pulling back on the diltiazem from 3 60-1 80 seem to help him.  Strength.  Physical therapy helped out significantly.  Past Medical History:  Diagnosis Date   A-fib ([place])    Anemia    Atrial fibrillation (HCC)    Back pain    COPD (chronic obstructive pulmonary disease) (HCC)    COPD (chronic obstructive pulmonary disease) (HCC)    Diabetes mellitus without complication (HCC)    Elevated PSA    GAD (generalized anxiety disorder)    High cholesterol    Hypertension    Osteoporosis     Past Surgical History:  Procedure Laterality Date   CATARACT EXTRACTION Right    SKIN SURGERY     shoulders and chest    Current Medications: Current Meds  Medication Sig   albuterol (PROAIR HFA) 108 (90 Base) MCG/ACT inhaler Inhale 2 puffs into the lungs every 6 (six) hours as needed for wheezing or shortness of breath.    albuterol (PROVENTIL) (2.5 MG/3ML) 0.083% nebulizer solution Take 3 mLs (2.5 mg total) by nebulization in the morning and at bedtime.   apixaban (ELIQUIS) 5 MG TABS tablet Take 1 tablet (5 mg total) by mouth 2 (two) times daily.    atorvastatin (LIPITOR) 40 MG tablet TAKE 1 TABLET(40 MG) BY MOUTH DAILY AFTER SUPPER   bisoprolol (ZEBETA) 5 MG tablet Take 5 mg by mouth daily.   budesonide-formoterol (SYMBICORT) 160-4.5 MCG/ACT inhaler Inhale 2 puffs into the lungs 2 (two) times daily.   Calcium 200 MG TABS Take 600 mg by mouth daily.   cholecalciferol (VITAMIN D3) 25 MCG (1000 UNIT) tablet Take 1,000 Units by mouth daily.   denosumab (PROLIA) 60 MG/ML SOSY injection Inject 60 mg into the skin every 6 (six) months.   diltiazem (CARDIZEM CD) 180 MG 24 hr capsule Take 1 capsule (180 mg total) by mouth daily.   furosemide (LASIX) 40 MG tablet Take 1 tablet (40 mg total) by mouth 2 (two) times daily.   guaiFENesin (MUCINEX) 600 MG 12 hr tablet Take 600 mg by mouth 2 (two) times daily.   loratadine (CLARITIN) 10 MG tablet Take 10 mg by mouth daily.   Melatonin 5 MG TABS Take 10 mg by mouth at bedtime.    omeprazole (PRILOSEC) 20 MG capsule Take 20 mg by mouth in the morning and at bedtime.   predniSONE (DELTASONE) 5 MG tablet Take 17.5mg  daily for 2 weeks then 15mg  daily until back in office. (Patient taking differently: 10 mg. 15mg  daily. Pt is o 10 mg)   Tiotropium Bromide Monohydrate (SPIRIVA RESPIMAT) 2.5  MCG/ACT AERS Inhale 2 puffs into the lungs daily.   [DISCONTINUED] furosemide (LASIX) 40 MG tablet Take 1 tablet (40 mg total) by mouth 2 (two) times daily.     Allergies:   Daliresp [roflumilast] and Alendronate   Social History   Socioeconomic History   Marital status: Married    Spouse name: Not on file   Number of children: 3   Years of education: Not on file   Highest education level: Not on file  Occupational History   Occupation: retired    Comment: truck driver  Tobacco Use   Smoking status: Former Smoker    Packs/day: 2.00    Years: 52.00    Pack years: 104.00    Types: Cigarettes    Quit date: 02/03/2009    Years since quitting: 11.5   Smokeless tobacco: Never Used   Tobacco  comment: Counseled to remain smoke free  Vaping Use   Vaping Use: Never used  Substance and Sexual Activity   Alcohol use: Yes    Alcohol/week: 0.0 standard drinks    Comment: occ   Drug use: No   Sexual activity: Not on file  Other Topics Concern   Not on file  Social History Narrative   Originally from [place]. Previously worked driving a Restaurant manager, fast food. No pets currently. No mold exposure. 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       wears sunscreen, brushes and flosses daily, see's dentist bi-annually, has smoke/carbon monoxide detectors, wears a seatbelt and practices gun safety      Social Determinants of Health   Financial Resource Strain:    Difficulty of Paying Living Expenses: Not on file  Food Insecurity:    Worried About Charity fundraiser in the Last Year: Not on file   [employer] of Food in the Last Year: Not on file  Transportation Needs:    Lack of Transportation (Medical): Not on file   Lack of Transportation (Non-Medical): Not on file  Physical Activity: Inactive   Days of Exercise per Week: 0 days   Minutes of Exercise per Session: 0 min  Stress:    Feeling of Stress : Not on file  Social Connections:    Frequency of Communication with Friends and Family: Not on file   Frequency of Social Gatherings with Friends and Family: Not on file   Attends Religious Services: Not on file   Active Member of Clubs or Organizations: Not on file   Attends Archivist Meetings: Not on file   Marital Status: Not on file     Family History: The patient's family history includes Cancer in his paternal uncle; Heart disease in his brother, father, and mother.  ROS:   Please see the history of present illness.     All other systems reviewed and are negative.  EKGs/Labs/Other Studies Reviewed:    The following studies were reviewed today:  Echocardiogram:2021   1. Left  ventricular ejection fraction, by estimation, is 60 to 65%. The  left ventricle has normal function. The left ventricle has no regional  wall motion abnormalities. Left ventricular diastolic parameters were  normal.  2. Right ventricular systolic function is normal. The right ventricular  size is normal. There is normal pulmonary artery systolic pressure.  3. The mitral valve is normal in structure. Trivial mitral valve  regurgitation. No evidence of mitral stenosis.  4. The aortic valve is tricuspid. Aortic valve  regurgitation is not  visualized. Mild aortic valve sclerosis is present, with no evidence of  aortic valve stenosis.  5. The inferior vena cava is normal in size with greater than 50%  respiratory variability, suggesting right atrial pressure of 3 mmHg.   Nuclear stress test 2020-low risk no ischemia  Recent Labs: 03/04/2020: B Natriuretic Peptide 82.1 06/13/2020: ALT 19; BUN 17; Creatinine, Ser 0.87; Hemoglobin 11.8; Platelets 357; Potassium 3.9; Sodium 141  Recent Lipid Panel    Component Value Date/Time   CHOL 192 06/13/2020 1002   TRIG 171 (H) 06/13/2020 1002   HDL 61 06/13/2020 1002   CHOLHDL 3.1 06/13/2020 1002   LDLCALC 102 (H) 06/13/2020 1002    Physical Exam:    VS:  BP 140/60    Pulse 76    Ht 5\' 6"  (1.676 m)    Wt 154 lb 9.6 oz (70.1 kg)    SpO2 96%    BMI 24.95 kg/m     Wt Readings from Last 3 Encounters:  08/23/20 154 lb 9.6 oz (70.1 kg)  07/25/20 146 lb 3.2 oz (66.3 kg)  06/29/20 144 lb 12.8 oz (65.7 kg)     GEN:  Well nourished, well developed in no acute distress HEENT: Multiple skin cancers removed NECK: No JVD; No carotid bruits LYMPHATICS: No lymphadenopathy CARDIAC: RRR, no murmurs, rubs, gallops RESPIRATORY:  Clear to auscultation without rales, wheezing or rhonchi  ABDOMEN: Soft, non-tender, non-distended MUSCULOSKELETAL:  No edema; No deformity  SKIN: Warm and dry, easy bruising NEUROLOGIC:  Alert and oriented x 3 PSYCHIATRIC:   Normal affect   ASSESSMENT:    1. Paroxysmal atrial fibrillation (HCC)   2. Chronic anticoagulation   3. Other emphysema ([place])   4. Coronary artery calcification    PLAN:    In order of problems listed above:  Lower extremity edema -Continue with Lasix 40 twice daily.  Renal function has been stable. -Legs under much better control.  Remember that prednisone can exacerbate this as well.  Severe COPD -5 prior hospitalizations in 2021.  He has not been in the hospital since April.  Excellent.  No changes made.  Pulmonary notes reviewed.  Paroxysmal atrial fibrillation -We decreased his diltiazem to 180 mg a day.  Continuing with bisoprolol 5 mg a day.  Chronic anticoagulation -Eliquis 5 mg twice a day.  No bleeding.  Does have some easy bruising.  Last hemoglobin 11.8.  Prior syncope -Be careful when standing up quickly.  Seems to have his good days and bad days but mostly good days.  Coronary artery calcification -Statin. Hyperlipidemia -Now on atorvastatin 40 mg.  Excellent.  Last LDL 98.    Medication Adjustments/Labs and Tests Ordered: Current medicines are reviewed at length with the patient today.  Concerns regarding medicines are outlined above.  No orders of the defined types were placed in this encounter.  Meds ordered this encounter  Medications   furosemide (LASIX) 40 MG tablet    Sig: Take 1 tablet (40 mg total) by mouth 2 (two) times daily.    Dispense:  180 tablet    Refill:  3    Patient Instructions  Medication Instructions:  The current medical regimen is effective;  continue present plan and medications.  *If you need a refill on your cardiac medications before your next appointment, please call your pharmacy*  Follow-Up: At [facility], you and your health needs are our priority.  As part of our continuing mission to provide you with exceptional heart care,  we have created designated Provider Care Teams.  These Care Teams include your  primary Cardiologist (physician) and Advanced Practice Providers (APPs -  Physician Assistants and Nurse Practitioners) who all work together to provide you with the care you need, when you need it.  We recommend signing up for the patient portal called "MyChart".  Sign up information is provided on this After Visit Summary.  MyChart is used to connect with patients for Virtual Visits (Telemedicine).  Patients are able to view lab/test results, encounter notes, upcoming appointments, etc.  Non-urgent messages can be sent to your provider as well.   To learn more about what you can do with MyChart, go to [email or web address].    Your next appointment:   6 month(s)  The format for your next appointment:   In Person  Provider:   Candee Furbish, MD   Thank you for choosing [facility]!!        Signed, Paul Furbish, MD  08/23/2020 11:54 AM    [place]

## 2020-08-30 DIAGNOSIS — C44612 Basal cell carcinoma of skin of right upper limb, including shoulder: Secondary | ICD-10-CM | POA: Diagnosis not present

## 2020-08-30 DIAGNOSIS — D0439 Carcinoma in situ of skin of other parts of face: Secondary | ICD-10-CM | POA: Diagnosis not present

## 2020-08-30 DIAGNOSIS — C44619 Basal cell carcinoma of skin of left upper limb, including shoulder: Secondary | ICD-10-CM | POA: Diagnosis not present

## 2020-09-04 ENCOUNTER — Ambulatory Visit (INDEPENDENT_AMBULATORY_CARE_PROVIDER_SITE_OTHER): Payer: Medicare Other

## 2020-09-04 DIAGNOSIS — M8000XD Age-related osteoporosis with current pathological fracture, unspecified site, subsequent encounter for fracture with routine healing: Secondary | ICD-10-CM | POA: Diagnosis not present

## 2020-09-04 MED ORDER — DENOSUMAB 60 MG/ML ~~LOC~~ SOSY
60.0000 mg | PREFILLED_SYRINGE | Freq: Once | SUBCUTANEOUS | Status: AC
  Administered 2020-09-04: 60 mg via SUBCUTANEOUS

## 2020-09-04 NOTE — Progress Notes (Signed)
° ° °  °  Patient came in today for a Prolia injection.  He tolerated it well.  Next dose will be due in March of 2022.  Shelle Iron, LPN 56/38/75 64:33 AM

## 2020-09-23 DIAGNOSIS — S8781XA Crushing injury of right lower leg, initial encounter: Secondary | ICD-10-CM | POA: Diagnosis not present

## 2020-09-26 DIAGNOSIS — S81811A Laceration without foreign body, right lower leg, initial encounter: Secondary | ICD-10-CM | POA: Diagnosis not present

## 2020-09-26 DIAGNOSIS — S80811A Abrasion, right lower leg, initial encounter: Secondary | ICD-10-CM | POA: Diagnosis not present

## 2020-09-26 DIAGNOSIS — A4902 Methicillin resistant Staphylococcus aureus infection, unspecified site: Secondary | ICD-10-CM | POA: Diagnosis not present

## 2020-10-02 ENCOUNTER — Encounter: Payer: Self-pay | Admitting: Family Medicine

## 2020-10-02 ENCOUNTER — Ambulatory Visit (INDEPENDENT_AMBULATORY_CARE_PROVIDER_SITE_OTHER): Payer: Medicare Other

## 2020-10-02 DIAGNOSIS — Z23 Encounter for immunization: Secondary | ICD-10-CM | POA: Diagnosis not present

## 2020-10-03 ENCOUNTER — Ambulatory Visit (INDEPENDENT_AMBULATORY_CARE_PROVIDER_SITE_OTHER): Payer: Medicare Other | Admitting: Internal Medicine

## 2020-10-03 ENCOUNTER — Encounter: Payer: Self-pay | Admitting: Internal Medicine

## 2020-10-03 ENCOUNTER — Other Ambulatory Visit: Payer: Self-pay

## 2020-10-03 DIAGNOSIS — J449 Chronic obstructive pulmonary disease, unspecified: Secondary | ICD-10-CM | POA: Diagnosis not present

## 2020-10-03 DIAGNOSIS — I2510 Atherosclerotic heart disease of native coronary artery without angina pectoris: Secondary | ICD-10-CM | POA: Diagnosis not present

## 2020-10-03 DIAGNOSIS — J9611 Chronic respiratory failure with hypoxia: Secondary | ICD-10-CM

## 2020-10-03 DIAGNOSIS — I2584 Coronary atherosclerosis due to calcified coronary lesion: Secondary | ICD-10-CM

## 2020-10-03 MED ORDER — OMEPRAZOLE 20 MG PO CPDR: DELAYED_RELEASE_CAPSULE | ORAL | Status: DC

## 2020-10-03 MED ORDER — SPIRIVA RESPIMAT 2.5 MCG/ACT IN AERS: 2.0000 | INHALATION_SPRAY | Freq: Every day | RESPIRATORY_TRACT | 0 refills | Status: DC

## 2020-10-03 NOTE — Patient Instructions (Addendum)
Make sure you check your oxygen saturations at highest level of activity to be sure it stays over 90% and adjust  02 flow upward to maintain this level if needed but remember to turn it back to previous settings when you stop (to conserve your supply).   Plan A = Automatic = Always=   symbicort and spiriva  First thing in am and then symbicort 12 hours later  Prednisone 10 mg daily   Plan B = Backup (to supplement plan A, not to replace it) Only use your albuterol (Proventil) inhaler as a rescue medication to be used if you can't catch your breath by resting or doing a relaxed purse lip breathing pattern.  - The less you use it, the better it will work when you need it. - Ok to use the inhaler up to 2 puffs  every 4 hours if you must but call for appointment if use goes up over your usual need - Don't leave home without it !!  (think of it like the spare tire for your car)   Plan C = Crisis (instead of Plan B but only if Plan B stops working) - only use your albuterol nebulizer if you first try Plan B and it fails to help > ok to use the nebulizer up to every 4 hours but if start needing it regularly call for immediate appointment  Go ahead and request the moderna booster this month  Please schedule a follow up visit in 3 months but call sooner if needed

## 2020-10-03 NOTE — Assessment & Plan Note (Signed)
Quit smoking 01/2009  02/04/12 [name]: FEV1 36% FVC 57% FEF 25 75    11% -05/29/16  FEV1 0.83 L (31%) FEV1/FVC 0.40 FEF 25-75 0.30 L (14%) no bronchodilator response p ? rx prior, DLCO corrected 60% (hemoglobin 10.9) - 03/29/2019   continue symb 160 2bid and spiriva smi 2 puffs each am  - 04/15/2019 placed on daily pred with ceiling 40 and floor of 10 mg daily and empirical rx for GERD - 04/15/2019 flutter valve training  - alpha one screen 06/15/2019  MM  Level 178 - 06/15/2019  After extensive coaching inhaler device,  effectiveness =    90% > so try to taper pred off using ceiling of 20 if worse > 07/26/2019 changed to ceiling of 10 and floor of 5 mg  - 10/26/2019  After extensive coaching inhaler device,  effectiveness =    90% with elipta and still needing pred 20 mg daily so try trelegy sample > consider breztri if prefers hfa and insurance covers  - 12/27/2019 added ppi bid ac otc for atypical spells of sob  - 01/27/2020 changed lopressor to bisoprolol due to refractory wheeze/ sob on lopressor 100 mg daily - Sinus CT  02/03/20 1. Minimal paranasal sinus mucosal thickening, limited to a left ethmoid air cell. No convincing acute sinusitis. 2. Symmetric but mild nasal cavity mucosal thickening, raising the possibility of mild rhinitis. CT chest 02/03/20 1. Coronary artery calcifications are noted suggesting coronary artery disease. 2. Emphysematous disease is noted in the upper lobes bilaterally. 3. Interval development of several old thoracic compression fractures since prior exam. No definite acute fracture is noted.  - 02/24/20 trial of daliresp 250 > could not tol  10/03/2020  After extensive coaching inhaler device,  effectiveness =    75% (short ti, short breath hold, too much of a hurry    Group D in terms of symptom/risk and laba/lama/ICS  therefore appropriate rx at this point >>>  Symb/spiriva/ pred at 10 mg daily   Re saba: I spent extra time with pt today reviewing appropriate use of  albuterol for prn use on exertion with the following points: 1) saba is for relief of sob that does not improve by walking a slower pace or resting but rather if the pt does not improve after trying this first. 2) If the pt is convinced, as many are, that saba helps recover from activity faster then it's easy to tell if this is the case by re-challenging : ie stop, take the inhaler, then p 5 minutes try the exact same activity (intensity of workload) that just caused the symptoms and see if they are substantially diminished or not after saba 3) if there is an activity that reproducibly causes the symptoms, try the saba 15 min before the activity on alternate days   If in fact the saba really does help, then fine to continue to use it prn but advised may need to look closer at the maintenance regimen being used to achieve better control of airways disease with exertion.

## 2020-10-03 NOTE — Assessment & Plan Note (Signed)
02 hs since 2013 02/06/12 ONO RA: desat <88% 7 episodes per hour, 66 episodes total 01/27/2013 ONO RA desat < 88% 62min of recording , still needs nocturnal oxygen - 03/25/2019 d/c from admit on 2lpm but not using x at hs  - 04/15/2019 02 sats 94% at rest despite flare in copd so rec 2lpm  Hs and prn activity  to keep sats > 90% at all times   As of 10/03/2020   2lpm hs and prn daytime > goal is to keep sats > 90% esp with exertion  Advised: Make sure you check your oxygen saturations at highest level of activity to be sure it stays over 90% and adjust  02 flow upward to maintain this level if needed but remember to turn it back to previous settings when you stop (to conserve your supply).           Each maintenance medication was reviewed in detail including emphasizing most importantly the difference between maintenance and prns and under what circumstances the prns are to be triggered using an action plan format where appropriate.  Total time for H and P, chart review, counseling, teaching device and generating customized AVS unique to this office visit / charting = 24 min

## 2020-10-03 NOTE — Progress Notes (Signed)
Paul [name], male    DOB: 1946-02-14,    MRN: [id number]   Brief patient profile:  44 yowm MM/Quit smoking  02/03/09  with GOLD III spirometry baseline doe x able to golf. Only using 02 at  [facility] with  maint on symbicort/ spiriva and no chronic pred/ albuterol twice daily at most  much worse mid March 2020.   PFT 05/29/16: FVC 2.08 L (86%) FEV1 0.83 L (31%) FEV1/FVC 0.40 FEF 25-75 0.30 L (14%) no bronchodilator response TLC 6.56 L (107%) RV 167% ERV 160% DLCO corrected 60% (hemoglobin 10.9) 07/15/13: FVC 2.68 L (60%) FEV1 1.63 L (53%) FEV1/FVC 0.61 FEF 25-75 0.76 L (26%) 02/04/12: FVC 2.18 L (57%) FEV1 1.09 L (36%) FEV1/FVC 0.50 FEF 25-75 0.33 L (11%)   Admit date: 03/19/2019 Discharge date: 03/25/2019   Recommendations for Outpatient Follow-up:  1. F/u with PCP within a week  for hospital discharge follow up, repeat cbc/bmp at follow up 2. F/u with cardiology for new diagnosis of afib 3. Home 02  Discharge Diagnoses:      Active Hospital Problems   Diagnosis Date Noted  . PAF (paroxysmal atrial fibrillation) ([place])   . Acute on chronic respiratory failure with hypoxia ([place])   . COPD exacerbation ([place]) 03/19/2019         Filed Weights   03/23/19 1700 03/24/19 0503 03/25/19 0528  Weight: 60.2 kg 60 kg 62.3 kg    History of present illness: (per admitting MD Dr Earnest Conroy) PCP:Cox, Elnita Maxwell, MD Patient coming from:Home  Chief Complaint:Worsening shortness of breath  [name]:DHRC [name] a 74 y.o.malewith history h/oCOPD, hyperlipidemia whoat baseline was using 2 L O2 via nasal cannula only at night and follows [facility] (Dr. Vaughan [name]) as outpatient presents with worsening shortness of breath. Patient reports that at baseline he has cough with white phlegm and is able to walk around doing his ADLs without shortness of breath and uses O2 only at night. 2 weeks back he developed productive cough with green phlegm and shortness of breath but no fevers. He  underwent CT chest with contrast on March 18 which was negative for PE or infiltrates. He apparently was given doxycycline/prednisone course at that time which helped him transiently but symptoms worsened again past Monday with shortness of breath on minimum exertion/cough and he noticed his pulse ox was dropping to 74%.He started using 2 L O2 nasal cannula continuous. On Tuesday, he called pulmonary office given persistent symptoms and also pulse oximetry showing O2 sats84% on exertion and 93% at rest in spite of using continuous O2. He was advised by Dr. Vaughan [name] to use 60 mg prednisone x3 days followed by 10 mg taper every 3 days. He was also advised to use nebulizer treatments every 3 hours and continue Symbicort inhaler twice daily. Patient was supposed to start 50 mg prednisone today but woke up feeling extremely short of breath. He states he is also been having bad bouts of cough at night with wheezing and bronchospasm.  He presented to [facility] ED where he was noted to be afebrile but hypoxic, required BiPAP for 3 hours, chest x-ray was negative for infiltrates. Patient denies any recent history of travel or known sick contacts with flu positive or coronavirus positive patients. He lives at home with his wife who is recovering from lung cancer and completed chemotherapy 1 month back.  Hospitalist service was called to request direct admission to medical floor but ED to ED transfer facilitated for thorough clinical evaluation prior  to requesting medical bed given coronavirus pandemic. In the ED at [facility], patient has been saturating well on 3 L nasal cannula but appears tachypneic on talking full sentences. He denies any chest pain, denies any fevers or leg swellings.  Hospital Course:  Active Problems:   COPD exacerbation (HCC)   PAF (paroxysmal atrial fibrillation) (HCC)   Acute on chronic respiratory failure with hypoxia (HCC)   Acute onHypoxic respiratory failure,  COPDexacerbation,Progression of severe COPD -Emphysema, with no evidence of superimposed acute cardiopulmonary disease -improving on iv steroids, oral zitrhomax, he finished total of 5 days of zithromax in the hospital, he is discharged on slow prednisone taper.  - o2 dropped to 86% while ambulating on room air, discharged on continuous home o2 ( previously on nighttime o2 only) -he is to follow with Dr Paul [name]    Paroxsymalafib, New diagnosis of afib -he is started on eliquis (CHADS-VASC 2, age and aortic atherosclerosis), cardizem, lasix -cardiology consulted recommend "Diltiazem CD 360. Avoid amiodarone and beta blockers given his underlying lung dz." -outpatient echo per cardiology   Aortic and multivessel coronary atherosclerosis - Changed to high intensity statin, atorvastatin 40mg  PO QD. Check lipids and ALT as outpatient in 3 months.  - ASA 81. Aggressive secondary prevention. - No chest pain.      History of Present Illness  03/29/2019  Pulmonary/ extended post hosp f/u eval/Edra [name]  Re GOLD III copd ? 02 dep now Chief Complaint  Patient presents with  . Hospitalization Follow-up    Breathing has improved some. He has not used his albuterol inhaler or neb since hospital d/c.  Dyspnea:  mb and back ok off 02 sats upper 80s at end  Cough: none Sleep: recliner x 45 degrees = baseline 02 2lpm hs  SABA use: none  Since d/c on symb 160 2bid and spiriva each pm (technique 50% on both, see a/p) On pred 50 mg daily on day as ov rec Plan A = Automatic = symbicort 160 Take 2 puffs first thing in am and then another 2 puffs about 12 hours later.  Spiriva x 2 puffs  right after symbicort  Work on inhaler technique: Plan B = Backup Only use your albuterol inhaler(Proventil/Proair)  as a rescue medication Plan C = Crisis - only use your albuterol nebulizer if you first try Plan B and it fails to help > ok to use the nebulizer up to every 4 hours but if start needing it  regularly call for immediate appointment Plan D = Doctor - call me if B and C not adequate Plan E = ER - go to ER or call 911 if all else fails   02  2lpm at bedtime and adjust to keep it over 90% with activity       06/15/2019  f/u ov/Anandi Abramo re:  COPD III  maint symb/spiriva on prednisone still 15 mg daily  Chief Complaint  Patient presents with  . Follow-up    Breathing has improved  Dyspnea:  10 min x 3 x daily  Due to back / no hills sats usually lower 90s RA Cough: no  Sleeping: 10 degrees SABA use: none 02: 2lpm hs  rec Prednisone 10 mg with bfast x one week then 1/2 x one week then 1/2 on even days x 2 weeks and stop At any point get worse, increase back to 20 mg daily and start over.   07/26/2019  f/u ov/Ikeisha Blumberg re: copd III/ maint pred 10 mg daily  -tends to flare  on lower doses.  Despite maintenance with Symbicort and Spiriva. Chief Complaint  Patient presents with  . Follow-up    Had increased SOB when weened off pred so started back on 10 mg daily and this has helped. He is using his proair 4 x per wk on average and uses neb once per day.  Dyspnea:  10 -12 min outside and inside slowed both by back and breathing  Cough: better / min mucoid sputum production even in am Sleeping: flat  SABA use: way too much  - does not rechallenge  02: 02 2lpm hs  rec Prednisone 10 mg ceiling and 5 mg (one half) floor for now  Plan A  = symbicort, spiriva and prednisone as above Plan B Only use your albuterol as a rescue medication Plan C  Use the nebulizer up to every 4 hours if Plan B doesn't work      10/26/2019  f/u ov/Jayleah Garbers re: GOLD III/  Steroid dep since early March 2020 on symb/spiriva and pred 20 mg daily  Chief Complaint  Patient presents with  . Follow-up    Breathing is doing well. He uses his proair once per wk on average. He has not used neb recently.   Dyspnea: walking 1.5 - 3 miles a day  Cough: none  Sleeping: hob up 6 inches mattress wedge  SABA use: as above,  rarely needed 02: 2lpm hs/ sats running in high 80's with activity  rec Plan A = Automatic = Always=    Trelegy one click each am (spiriva/ symbicort)  Plan B = Backup (to supplement plan A, not to replace it) Only use your albuterol inhaler as a rescue medication Plan C = Crisis (instead of Plan B but only if Plan B stops working) - only use your albuterol nebulizer if you first try Plan B  Try prednisone 15 mg daily if possible - increase to 20 mg daily if not doing as well    Televisit  12/27/19 recs Plan A = Automatic = Always=    Trelegy each am  Plan B = Backup (to supplement plan A, not to replace it) Only use your albuterol inhaler as a rescue medication Plan C = Crisis (instead of Plan B but only if Plan B stops working) - only use your albuterol nebulizer if you first try Plan B Plan D = Deltasone  - 2 prednisone = 20 mg / day until better then drop to 15 mg daily as tolerated  Make sure you check your oxygen saturations at highest level of activity to be sure it stays over 90% and adjust upward to maintain this level if needed but remember to turn it back to previous settings when you stop (to conserve your supply).  Late add:  Prilosec 20 mg Take 30- 60 min before your first and last meals of the day until returns and continue off the fosfamax (not taking it anyway)   GERD diet zpak    Admit date: 01/04/2020 Discharge date: 01/11/2020  Admitted From: home Disposition: home  Recommendations for Outpatient Follow-up:  1. Follow up with PCP in 1-2 weeks 2. Keep appointment with Dr. Joya Gaskins as scheduled 3. Please obtain BMP/CBC in one week 4. Please follow up on the following pending results: consider starting prolia  Home Health:no Equipment/Devices:home oxygen, not new     Brief/Interim Summary: Per H &P:Kalib [name] a 74 y.o.malewith medical history significant ofA. fib, COPD, chronic respiratory failure on 2 L home oxygen only at nighttime,  hyperlipidemia  presented with acute exacerbation of chronic obstructive pulmonary disease. Due to the severity of his underlying lung disease with chronic prednisone usage slowly weaned down to TID nebulizers scheduled and to oral prednisone. Had some Pt/OT which caused desaturations throughout hospital stay. Due to increased coughing in the hospital he had new upper back pain. With findings of midline tenderness X-ray ordered with old compression fractures and new compression fracture in t spine. Started calcitonin nasal spray and urged him to consider prolia. He states he tried fosamax and could not tolerate. Overall he continued to improve throughout the hospital course and was discharged home on extended prednisone taper back to home 20 mg daily. Asked to schedule nebulizer treatments TID at home for about 1 week then down to BID if able to tolerate.    Discharge Diagnoses:  Principal Problem:   COPD with acute exacerbation [facility]):   Chronic respiratory failure with hypoxia (HCC)   Paroxysmal atrial fibrillation (HCC)   HLD (hyperlipidemia)   SOB (shortness of breath)   01/27/2020  f/u ov/Aadhav Uhlig re:  Copd with persistent flare since March 2020 remains high dose steroid dep  Chief Complaint  Patient presents with  . Follow-up    Breathing is about the same. He rarely uses his albuterol inhaler. He used neb last 1 wk ago. He has occ cough with green sputum. Currently on 40 mg pred.   Dyspnea:  Across the room  Cough: some before bed / uses flutter light green only using one 600 mg mucinex qam only  but does use flutter Sleeping: wedge blocks SABA use: minimal  02: 2lpm hs / says usually above 90% walking RA rec Stop lopressor and start Bisoprolol 5 mg daily in its place - if blood pressure is too high take twice daily  For cough / congestion > mucinex up to 1200 mg twice daily and use flutter Plan A = Automatic = Always=   Use albuterol neb 1st thing in am and wait 15 minutes and then use Trelegy  Plan B =  Backup (to supplement plan A, not to replace it) Only use your albuterol inhaler  Plan C = Crisis (instead of Plan B but only if Plan B stops working) - only use your albuterol nebulizer if you first try Plan B and it fails to help > ok to use the nebulizer up to every 4 hours but if start needing it regularly call for immediate appointment Prednisone 20 mg daily  - ok to double if doing worse Please schedule a follow up office visit in 4 weeks, sooner if needed  with all medications   Admission date:  02/16/2020   Discharge Date:  02/20/2020   COPD with acute exacerbation (HCC)  Paroxysmal atrial fibrillation ([place])  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[place])  Essential hypertension  Acute respiratory failure with hypoxia ([place])     02/24/2020   Extended post hosp ov/Elis Rawlinson re: copd  On prednisone 30 mg daily to taper to 20 p 3 more days Dyspnea:  Just walking in house for now but much better Cough: better  Sleeping: wedge under mattress maybe 30 degrees hob ok SABA use: none now  02: sleep on 2lpm/ sats in 90's s supplement rec Make sure you check your oxygen saturations at highest level of activity to be sure it stays over 90% and adjust upward to maintain this level if needed but remember to turn it back to previous settings when you stop (to conserve your  supply).  Try albuterol(Proair) 15 min before an activity that you know would make you short of breath and see if it makes any difference and if makes none then don't take it after activity unless you can't catch your breath. Reduce prednsione to 20 mg daily = 2 x 10 as planned  - if you are doing great ok to try 1.5 = 15 mg daily If continue to flare then ok to add daliresp 250 mg one daily  For cough / congestion as needed >>  mucinex up 1200mg  every 12 hours but use the flutter valve as much as you can  Please schedule a follow up office visit in 6 weeks, call sooner if needed with all medications /inhalers/ solutions in hand  so we can verify exactly what you are taking.     10/03/2020  f/u ov/Annelise Mccoy re: could not tolerate daliresp/ on pred 10 mg daily  Chief Complaint  Patient presents with  . Follow-up    Denies any problems with breathing  Dyspnea:  Treadmill  6/7 days,  30 min s stopping,  2.8 mph 3 degrees at the end does not check sats at peak ex Cough: none  Sleeping: wedge 30 degrees on side  SABA [id number] on neb twice daily  02: 2lpm hs /  Not using at all during day    No obvious day to day or daytime variability or assoc excess/ purulent sputum or mucus plugs or hemoptysis or cp or chest tightness, subjective wheeze or overt sinus or hb symptoms.   Sleeping without nocturnal  or early am exacerbation  of respiratory  c/o's or need for noct saba. Also denies any obvious fluctuation of symptoms with weather or environmental changes or other aggravating or alleviating factors except as outlined above   No unusual exposure hx or h/o childhood pna/ asthma or knowledge of premature birth.  Current Allergies, Complete Past Medical History, Past Surgical History, Family History, and Social History were reviewed in [employer] record.  ROS  The following are not active complaints unless bolded Hoarseness, sore throat, dysphagia, dental problems, itching, sneezing,  nasal congestion or discharge of excess mucus or purulent secretions, ear ache,   fever, chills, sweats, unintended wt loss or wt gain, classically pleuritic or exertional cp,  orthopnea pnd or arm/hand swelling  or leg swelling, presyncope, palpitations, abdominal pain, anorexia, nausea, vomiting, diarrhea  or change in bowel habits or change in bladder habits, change in stools or change in urine, dysuria, hematuria,  rash, arthralgias, visual complaints, headache, numbness, weakness or ataxia or problems with walking or coordination,  change in mood or  memory.        Current Meds  Medication Sig  . albuterol (PROAIR HFA)  108 (90 Base) MCG/ACT inhaler Inhale 2 puffs into the lungs every 6 (six) hours as needed for wheezing or shortness of breath.   Marland Kitchen albuterol (PROVENTIL) (2.5 MG/3ML) 0.083% nebulizer solution Take 3 mLs (2.5 mg total) by nebulization in the morning and at bedtime.  Marland Kitchen apixaban (ELIQUIS) 5 MG TABS tablet Take 1 tablet (5 mg total) by mouth 2 (two) times daily.  Marland Kitchen atorvastatin (LIPITOR) 40 MG tablet TAKE 1 TABLET(40 MG) BY MOUTH DAILY AFTER SUPPER  . bisoprolol (ZEBETA) 5 MG tablet Take 5 mg by mouth daily.  . budesonide-formoterol (SYMBICORT) 160-4.5 MCG/ACT inhaler Inhale 2 puffs into the lungs 2 (two) times daily.  . Calcium 200 MG TABS Take 600 mg by mouth daily.  . cholecalciferol (VITAMIN  D3) 25 MCG (1000 UNIT) tablet Take 1,000 Units by mouth daily.  Marland Kitchen denosumab (PROLIA) 60 MG/ML SOSY injection Inject 60 mg into the skin every 6 (six) months.  . diltiazem (CARDIZEM CD) 180 MG 24 hr capsule Take 1 capsule (180 mg total) by mouth daily.  . furosemide (LASIX) 40 MG tablet Take 1 tablet (40 mg total) by mouth 2 (two) times daily.  Marland Kitchen guaiFENesin (MUCINEX) 600 MG 12 hr tablet Take 600 mg by mouth 2 (two) times daily.  Marland Kitchen loratadine (CLARITIN) 10 MG tablet Take 10 mg by mouth daily.  . Melatonin 5 MG TABS Take 10 mg by mouth at bedtime.   Marland Kitchen omeprazole (PRILOSEC) 20 MG capsule Take 20 mg by mouth in the morning and at bedtime.  . predniSONE (DELTASONE) 5 MG tablet Take 17.5mg  daily for 2 weeks then 15mg  daily until back in office. (Patient taking differently: 10 mg. 15mg  daily. Pt is o 10 mg)  . Tiotropium Bromide Monohydrate (SPIRIVA RESPIMAT) 2.5 MCG/ACT AERS Inhale 2 puffs into the lungs daily.              Objective:     amb somewhat anxious but very pleasant wm wm nad   10/03/2020      155  02/24/2020         137  01/27/2020         144 10/26/2019        156  07/26/2019          144   06/15/19 136 lb (61.7 kg)  05/29/19 134 lb 7.7 oz (61 kg)  05/28/19 134 lb 3.2 oz (60.9 kg)    Vital  signs reviewed  10/03/2020  - Note at rest 02 sats  97% on RA       Reports:   Full dentures HEENT : pt wearing mask not removed for exam due to covid -19 concerns.    NECK :  without JVD/Nodes/TM/ nl carotid upstrokes bilaterally   LUNGS: no acc muscle use,  Mod barrel  contour chest wall with bilateral  Distant bs s audible wheeze and  without cough on insp or exp maneuvers and mod  Hyperresonant  to  percussion bilaterally     CV:  RRR  no s3 or murmur or increase in P2, and no edema   ABD:  soft and nontender slt pot belly contour with pos mid insp Hoover's  in the supine position. No bruits or organomegaly appreciated, bowel sounds nl  MS:     ext warm without deformities, calf tenderness, cyanosis or clubbing No obvious joint restrictions   SKIN: warm and dry without lesions    NEURO:  alert, approp, nl sensorium with  no motor or cerebellar deficits apparent.                       Assessment

## 2020-10-04 ENCOUNTER — Telehealth: Payer: Self-pay | Admitting: Cardiology

## 2020-10-04 NOTE — Telephone Encounter (Signed)
° ° °  Dana and Pt returning call

## 2020-10-04 NOTE — Telephone Encounter (Signed)
Of note - there is no sign DPR on file to be able to speak with Rayburn Go.

## 2020-10-04 NOTE — Telephone Encounter (Signed)
Attempted to c/b to # listed.  Call was sent directly to VM.  Left message to return the call and that I will send pt a message through his pt portal.

## 2020-10-04 NOTE — Telephone Encounter (Signed)
error 

## 2020-10-04 NOTE — Telephone Encounter (Signed)
His Eliquis is currently 5 mg twice a day and this is the appropriate dose given his weight and age.  A lower dose would not protect him adequately against the potential for stroke.  Thankfully his last ECG showed sinus rhythm.  Obviously if he is losing or bleeding quite a bit from a wound, he may need to hold his Eliquis for a day or 2.   Also, we could offer him a consultation with Dr. Quentin Ore with electrophysiology to discuss watchman device.  This could allow him to come off of anticoagulation and still be protected.  Candee Furbish, MD

## 2020-10-04 NOTE — Telephone Encounter (Signed)
Left message on home VM to c/b to discuss options.

## 2020-10-04 NOTE — Telephone Encounter (Signed)
Will have Dr Marlou Porch review and call pt back with orders/recommendation.

## 2020-10-04 NOTE — Telephone Encounter (Signed)
Pt c/o medication issue:  1. Name of Medication: apixaban (ELIQUIS) 5 MG TABS tablet  2. How are you currently taking this medication (dosage and times per day)? As directed  3. Are you having a reaction (difficulty breathing--STAT)? no  4. What is your medication issue? Patients wife states that the patient is bleeding a lot. He is on his way to a wound doctor right now. She states that his skin is thinning and wants to know if Dr. Marlou Porch would be okay if her husband started taking half the dose he is now. Please advise.

## 2020-10-05 DIAGNOSIS — I110 Hypertensive heart disease with heart failure: Secondary | ICD-10-CM | POA: Diagnosis not present

## 2020-10-05 DIAGNOSIS — I83028 Varicose veins of left lower extremity with ulcer other part of lower leg: Secondary | ICD-10-CM | POA: Diagnosis not present

## 2020-10-05 DIAGNOSIS — D649 Anemia, unspecified: Secondary | ICD-10-CM | POA: Diagnosis not present

## 2020-10-05 DIAGNOSIS — L97812 Non-pressure chronic ulcer of other part of right lower leg with fat layer exposed: Secondary | ICD-10-CM | POA: Diagnosis not present

## 2020-10-05 DIAGNOSIS — I2510 Atherosclerotic heart disease of native coronary artery without angina pectoris: Secondary | ICD-10-CM | POA: Diagnosis not present

## 2020-10-05 DIAGNOSIS — I83018 Varicose veins of right lower extremity with ulcer other part of lower leg: Secondary | ICD-10-CM | POA: Diagnosis not present

## 2020-10-05 DIAGNOSIS — Z7952 Long term (current) use of systemic steroids: Secondary | ICD-10-CM | POA: Diagnosis not present

## 2020-10-05 DIAGNOSIS — Z87891 Personal history of nicotine dependence: Secondary | ICD-10-CM | POA: Diagnosis not present

## 2020-10-05 DIAGNOSIS — I5032 Chronic diastolic (congestive) heart failure: Secondary | ICD-10-CM | POA: Diagnosis not present

## 2020-10-05 DIAGNOSIS — L97822 Non-pressure chronic ulcer of other part of left lower leg with fat layer exposed: Secondary | ICD-10-CM | POA: Diagnosis not present

## 2020-10-05 DIAGNOSIS — J449 Chronic obstructive pulmonary disease, unspecified: Secondary | ICD-10-CM | POA: Diagnosis not present

## 2020-10-05 DIAGNOSIS — J9611 Chronic respiratory failure with hypoxia: Secondary | ICD-10-CM | POA: Diagnosis not present

## 2020-10-10 ENCOUNTER — Encounter: Payer: Self-pay | Admitting: Family Medicine

## 2020-10-12 DIAGNOSIS — I2510 Atherosclerotic heart disease of native coronary artery without angina pectoris: Secondary | ICD-10-CM | POA: Diagnosis not present

## 2020-10-12 DIAGNOSIS — I110 Hypertensive heart disease with heart failure: Secondary | ICD-10-CM | POA: Diagnosis not present

## 2020-10-12 DIAGNOSIS — I83018 Varicose veins of right lower extremity with ulcer other part of lower leg: Secondary | ICD-10-CM | POA: Diagnosis not present

## 2020-10-12 DIAGNOSIS — Z87891 Personal history of nicotine dependence: Secondary | ICD-10-CM | POA: Diagnosis not present

## 2020-10-12 DIAGNOSIS — Z7952 Long term (current) use of systemic steroids: Secondary | ICD-10-CM | POA: Diagnosis not present

## 2020-10-12 DIAGNOSIS — L97212 Non-pressure chronic ulcer of right calf with fat layer exposed: Secondary | ICD-10-CM | POA: Diagnosis not present

## 2020-10-12 DIAGNOSIS — D649 Anemia, unspecified: Secondary | ICD-10-CM | POA: Diagnosis not present

## 2020-10-12 DIAGNOSIS — J449 Chronic obstructive pulmonary disease, unspecified: Secondary | ICD-10-CM | POA: Diagnosis not present

## 2020-10-12 DIAGNOSIS — L97812 Non-pressure chronic ulcer of other part of right lower leg with fat layer exposed: Secondary | ICD-10-CM | POA: Diagnosis not present

## 2020-10-12 DIAGNOSIS — I83012 Varicose veins of right lower extremity with ulcer of calf: Secondary | ICD-10-CM | POA: Diagnosis not present

## 2020-10-12 DIAGNOSIS — I5032 Chronic diastolic (congestive) heart failure: Secondary | ICD-10-CM | POA: Diagnosis not present

## 2020-10-12 DIAGNOSIS — J9611 Chronic respiratory failure with hypoxia: Secondary | ICD-10-CM | POA: Diagnosis not present

## 2020-10-19 DIAGNOSIS — L97822 Non-pressure chronic ulcer of other part of left lower leg with fat layer exposed: Secondary | ICD-10-CM | POA: Diagnosis not present

## 2020-10-19 DIAGNOSIS — I83012 Varicose veins of right lower extremity with ulcer of calf: Secondary | ICD-10-CM | POA: Diagnosis not present

## 2020-10-19 DIAGNOSIS — J9611 Chronic respiratory failure with hypoxia: Secondary | ICD-10-CM | POA: Diagnosis not present

## 2020-10-19 DIAGNOSIS — L97812 Non-pressure chronic ulcer of other part of right lower leg with fat layer exposed: Secondary | ICD-10-CM | POA: Diagnosis not present

## 2020-10-19 DIAGNOSIS — I83028 Varicose veins of left lower extremity with ulcer other part of lower leg: Secondary | ICD-10-CM | POA: Diagnosis not present

## 2020-10-19 DIAGNOSIS — I83018 Varicose veins of right lower extremity with ulcer other part of lower leg: Secondary | ICD-10-CM | POA: Diagnosis not present

## 2020-10-19 DIAGNOSIS — I5032 Chronic diastolic (congestive) heart failure: Secondary | ICD-10-CM | POA: Diagnosis not present

## 2020-10-19 DIAGNOSIS — Z9981 Dependence on supplemental oxygen: Secondary | ICD-10-CM | POA: Diagnosis not present

## 2020-10-19 DIAGNOSIS — L97212 Non-pressure chronic ulcer of right calf with fat layer exposed: Secondary | ICD-10-CM | POA: Diagnosis not present

## 2020-10-19 DIAGNOSIS — M7989 Other specified soft tissue disorders: Secondary | ICD-10-CM | POA: Diagnosis not present

## 2020-10-19 DIAGNOSIS — Z7952 Long term (current) use of systemic steroids: Secondary | ICD-10-CM | POA: Diagnosis not present

## 2020-10-26 DIAGNOSIS — L97822 Non-pressure chronic ulcer of other part of left lower leg with fat layer exposed: Secondary | ICD-10-CM | POA: Diagnosis not present

## 2020-10-26 DIAGNOSIS — Z7952 Long term (current) use of systemic steroids: Secondary | ICD-10-CM | POA: Diagnosis not present

## 2020-10-26 DIAGNOSIS — S80812D Abrasion, left lower leg, subsequent encounter: Secondary | ICD-10-CM | POA: Diagnosis not present

## 2020-10-26 DIAGNOSIS — I83018 Varicose veins of right lower extremity with ulcer other part of lower leg: Secondary | ICD-10-CM | POA: Diagnosis not present

## 2020-10-26 DIAGNOSIS — J9611 Chronic respiratory failure with hypoxia: Secondary | ICD-10-CM | POA: Diagnosis not present

## 2020-10-26 DIAGNOSIS — I83028 Varicose veins of left lower extremity with ulcer other part of lower leg: Secondary | ICD-10-CM | POA: Diagnosis not present

## 2020-10-26 DIAGNOSIS — S80811D Abrasion, right lower leg, subsequent encounter: Secondary | ICD-10-CM | POA: Diagnosis not present

## 2020-10-26 DIAGNOSIS — L97812 Non-pressure chronic ulcer of other part of right lower leg with fat layer exposed: Secondary | ICD-10-CM | POA: Diagnosis not present

## 2020-10-26 DIAGNOSIS — I5032 Chronic diastolic (congestive) heart failure: Secondary | ICD-10-CM | POA: Diagnosis not present

## 2020-10-31 DIAGNOSIS — Z961 Presence of intraocular lens: Secondary | ICD-10-CM | POA: Diagnosis not present

## 2020-10-31 DIAGNOSIS — H524 Presbyopia: Secondary | ICD-10-CM | POA: Diagnosis not present

## 2020-10-31 DIAGNOSIS — H35013 Changes in retinal vascular appearance, bilateral: Secondary | ICD-10-CM | POA: Diagnosis not present

## 2020-10-31 DIAGNOSIS — D3132 Benign neoplasm of left choroid: Secondary | ICD-10-CM | POA: Diagnosis not present

## 2020-11-02 DIAGNOSIS — I83012 Varicose veins of right lower extremity with ulcer of calf: Secondary | ICD-10-CM | POA: Diagnosis not present

## 2020-11-02 DIAGNOSIS — I5032 Chronic diastolic (congestive) heart failure: Secondary | ICD-10-CM | POA: Diagnosis not present

## 2020-11-02 DIAGNOSIS — L97212 Non-pressure chronic ulcer of right calf with fat layer exposed: Secondary | ICD-10-CM | POA: Diagnosis not present

## 2020-11-02 DIAGNOSIS — I83028 Varicose veins of left lower extremity with ulcer other part of lower leg: Secondary | ICD-10-CM | POA: Diagnosis not present

## 2020-11-02 DIAGNOSIS — L97822 Non-pressure chronic ulcer of other part of left lower leg with fat layer exposed: Secondary | ICD-10-CM | POA: Diagnosis not present

## 2020-11-02 DIAGNOSIS — I83018 Varicose veins of right lower extremity with ulcer other part of lower leg: Secondary | ICD-10-CM | POA: Diagnosis not present

## 2020-11-02 DIAGNOSIS — Z7952 Long term (current) use of systemic steroids: Secondary | ICD-10-CM | POA: Diagnosis not present

## 2020-11-02 DIAGNOSIS — L97812 Non-pressure chronic ulcer of other part of right lower leg with fat layer exposed: Secondary | ICD-10-CM | POA: Diagnosis not present

## 2020-11-02 DIAGNOSIS — J9611 Chronic respiratory failure with hypoxia: Secondary | ICD-10-CM | POA: Diagnosis not present

## 2020-11-09 DIAGNOSIS — I83012 Varicose veins of right lower extremity with ulcer of calf: Secondary | ICD-10-CM | POA: Diagnosis not present

## 2020-11-09 DIAGNOSIS — I83018 Varicose veins of right lower extremity with ulcer other part of lower leg: Secondary | ICD-10-CM | POA: Diagnosis not present

## 2020-11-09 DIAGNOSIS — Z7952 Long term (current) use of systemic steroids: Secondary | ICD-10-CM | POA: Diagnosis not present

## 2020-11-09 DIAGNOSIS — I5032 Chronic diastolic (congestive) heart failure: Secondary | ICD-10-CM | POA: Diagnosis not present

## 2020-11-09 DIAGNOSIS — J9610 Chronic respiratory failure, unspecified whether with hypoxia or hypercapnia: Secondary | ICD-10-CM | POA: Diagnosis not present

## 2020-11-09 DIAGNOSIS — R609 Edema, unspecified: Secondary | ICD-10-CM | POA: Diagnosis not present

## 2020-11-09 DIAGNOSIS — L97212 Non-pressure chronic ulcer of right calf with fat layer exposed: Secondary | ICD-10-CM | POA: Diagnosis not present

## 2020-11-20 ENCOUNTER — Other Ambulatory Visit: Payer: Medicare Other

## 2020-11-20 DIAGNOSIS — D649 Anemia, unspecified: Secondary | ICD-10-CM | POA: Diagnosis not present

## 2020-11-20 DIAGNOSIS — E1169 Type 2 diabetes mellitus with other specified complication: Secondary | ICD-10-CM

## 2020-11-20 DIAGNOSIS — E782 Mixed hyperlipidemia: Secondary | ICD-10-CM | POA: Diagnosis not present

## 2020-11-20 DIAGNOSIS — I10 Essential (primary) hypertension: Secondary | ICD-10-CM

## 2020-11-21 LAB — CBC WITH DIFF/PLATELET
Basophils Absolute: 0 10*3/uL (ref 0.0–0.2)
Basos: 0 %
EOS (ABSOLUTE): 0.1 10*3/uL (ref 0.0–0.4)
Eos: 1 %
Hematocrit: 36.9 % — ABNORMAL LOW (ref 37.5–51.0)
Hemoglobin: 11.6 g/dL — ABNORMAL LOW (ref 13.0–17.7)
Immature Grans (Abs): 0 10*3/uL (ref 0.0–0.1)
Immature Granulocytes: 0 %
Lymphocytes Absolute: 3.5 10*3/uL — ABNORMAL HIGH (ref 0.7–3.1)
Lymphs: 36 %
MCH: 24.7 pg — ABNORMAL LOW (ref 26.6–33.0)
MCHC: 31.4 g/dL — ABNORMAL LOW (ref 31.5–35.7)
MCV: 79 fL (ref 79–97)
Monocytes Absolute: 0.8 10*3/uL (ref 0.1–0.9)
Monocytes: 9 %
Neutrophils Absolute: 5.3 10*3/uL (ref 1.4–7.0)
Neutrophils: 54 %
Platelets: 322 10*3/uL (ref 150–450)
RBC: 4.69 x10E6/uL (ref 4.14–5.80)
RDW: 14.6 % (ref 11.6–15.4)
WBC: 9.8 10*3/uL (ref 3.4–10.8)

## 2020-11-21 LAB — COMPREHENSIVE METABOLIC PANEL
ALT: 27 IU/L (ref 0–44)
AST: 18 IU/L (ref 0–40)
Albumin/Globulin Ratio: 1.9 (ref 1.2–2.2)
Albumin: 4.1 g/dL (ref 3.7–4.7)
Alkaline Phosphatase: 70 IU/L (ref 44–121)
BUN/Creatinine Ratio: 26 — ABNORMAL HIGH (ref 10–24)
BUN: 18 mg/dL (ref 8–27)
Bilirubin Total: 0.2 mg/dL (ref 0.0–1.2)
CO2: 27 mmol/L (ref 20–29)
Calcium: 8.9 mg/dL (ref 8.6–10.2)
Chloride: 104 mmol/L (ref 96–106)
Creatinine, Ser: 0.69 mg/dL — ABNORMAL LOW (ref 0.76–1.27)
GFR calc Af Amer: 107 mL/min/{1.73_m2} (ref >59)
GFR calc non Af Amer: 93 mL/min/{1.73_m2} (ref >59)
Globulin, Total: 2.2 g/dL (ref 1.5–4.5)
Glucose: 98 mg/dL (ref 65–99)
Potassium: 4.3 mmol/L (ref 3.5–5.2)
Sodium: 144 mmol/L (ref 134–144)
Total Protein: 6.3 g/dL (ref 6.0–8.5)

## 2020-11-21 LAB — LIPID PANEL
Chol/HDL Ratio: 2.9 ratio (ref 0.0–5.0)
Cholesterol, Total: 172 mg/dL (ref 100–199)
HDL: 60 mg/dL (ref >39)
LDL Chol Calc (NIH): 90 mg/dL (ref 0–99)
Triglycerides: 123 mg/dL (ref 0–149)
VLDL Cholesterol Cal: 22 mg/dL (ref 5–40)

## 2020-11-21 LAB — CARDIOVASCULAR RISK ASSESSMENT

## 2020-11-21 LAB — HEMOGLOBIN A1C
Est. average glucose Bld gHb Est-mCnc: 134 mg/dL
Hgb A1c MFr Bld: 6.3 % — ABNORMAL HIGH (ref 4.8–5.6)

## 2020-11-22 ENCOUNTER — Ambulatory Visit (INDEPENDENT_AMBULATORY_CARE_PROVIDER_SITE_OTHER): Payer: Medicare Other | Admitting: Family Medicine

## 2020-11-22 ENCOUNTER — Telehealth: Payer: Self-pay | Admitting: Family Medicine

## 2020-11-22 ENCOUNTER — Other Ambulatory Visit: Payer: Self-pay

## 2020-11-22 ENCOUNTER — Encounter: Payer: Self-pay | Admitting: Family Medicine

## 2020-11-22 VITALS — BP 110/60 | HR 84 | Temp 97.2°F | Resp 16 | Ht 66.0 in | Wt 159.0 lb

## 2020-11-22 DIAGNOSIS — E1169 Type 2 diabetes mellitus with other specified complication: Secondary | ICD-10-CM

## 2020-11-22 DIAGNOSIS — D649 Anemia, unspecified: Secondary | ICD-10-CM | POA: Diagnosis not present

## 2020-11-22 DIAGNOSIS — Z87891 Personal history of nicotine dependence: Secondary | ICD-10-CM | POA: Diagnosis not present

## 2020-11-22 DIAGNOSIS — E782 Mixed hyperlipidemia: Secondary | ICD-10-CM | POA: Diagnosis not present

## 2020-11-22 DIAGNOSIS — M8000XD Age-related osteoporosis with current pathological fracture, unspecified site, subsequent encounter for fracture with routine healing: Secondary | ICD-10-CM

## 2020-11-22 DIAGNOSIS — I480 Paroxysmal atrial fibrillation: Secondary | ICD-10-CM | POA: Diagnosis not present

## 2020-11-22 DIAGNOSIS — J9611 Chronic respiratory failure with hypoxia: Secondary | ICD-10-CM | POA: Diagnosis not present

## 2020-11-22 DIAGNOSIS — Z9981 Dependence on supplemental oxygen: Secondary | ICD-10-CM | POA: Diagnosis not present

## 2020-11-22 DIAGNOSIS — J449 Chronic obstructive pulmonary disease, unspecified: Secondary | ICD-10-CM | POA: Diagnosis not present

## 2020-11-22 DIAGNOSIS — I700 Atherosclerosis of aorta: Secondary | ICD-10-CM | POA: Diagnosis not present

## 2020-11-22 DIAGNOSIS — I10 Essential (primary) hypertension: Secondary | ICD-10-CM | POA: Diagnosis not present

## 2020-11-22 DIAGNOSIS — Z6825 Body mass index (BMI) 25.0-25.9, adult: Secondary | ICD-10-CM

## 2020-11-22 NOTE — Progress Notes (Signed)
Subjective:  Patient ID: Paul [name], male    DOB: 1946/04/06  Age: 74 y.o. MRN: [id number]  Chief Complaint  Patient presents with  . Hyperlipidemia  . Hypertension    HPI Hyperlipidemia- patient is taking lipitor, eating a healthy diet.  Exercise at the gym.  LDL 90, triglycerides 123, HDL 60. GERD- controlled with Omeprazole  Hypertension-well-controlled on Cardizem, Zebeta.  Avoid salt in his diet. A-Fib-Controlled with Cardizem, Zebeta and Eliquis.  Follows with cardiology. Osteoporosis: Currently taking Prolia every 6 months.  Also taking calcium with vitamin D. COPD: Severe.  Currently fairly well controlled on Symbicort, Spiriva, prednisone 10 mg daily and albuterol HFA.  Patient is having significant issues with cost of his medications as he is in the donut hole.  Patient also has chronic respiratory failure with hypoxia.  He requires oxygen at nighttime.  Sometimes with exertion he also requires it. Pedal edema: Patient is currently on Lasix 40 mg twice daily.  Echo normal in April 2021.  On June 28, 2019 patient had a low risk nuclear stress test. Diabetes: Patient's last A1c was 6.3 on November 20, 2020.  Patient eats healthy.  Patient's A1c reached 6.8 in January 2021.  Patient was on quite a bit of prednisone at that time. Anemia, microcytic: Persistent, but stable. Patient has had a microcytic anemia since 2018.  In 2019 he had Hemoccult cards which were negative for blood x 3.  Last colonoscopy was November 2017.  Recommend repeat in 5 years.  Current Outpatient Medications on File Prior to Visit  Medication Sig Dispense Refill  . albuterol (PROAIR HFA) 108 (90 Base) MCG/ACT inhaler Inhale 2 puffs into the lungs every 6 (six) hours as needed for wheezing or shortness of breath.     Marland Kitchen albuterol (PROVENTIL) (2.5 MG/3ML) 0.083% nebulizer solution Take 3 mLs (2.5 mg total) by nebulization in the morning and at bedtime. 75 mL 6  . apixaban (ELIQUIS) 5 MG TABS tablet Take 1 tablet  (5 mg total) by mouth 2 (two) times daily. 180 tablet 3  . atorvastatin (LIPITOR) 40 MG tablet TAKE 1 TABLET(40 MG) BY MOUTH DAILY AFTER SUPPER 90 tablet 3  . bisoprolol (ZEBETA) 5 MG tablet Take 5 mg by mouth daily.    . budesonide-formoterol (SYMBICORT) 160-4.5 MCG/ACT inhaler Inhale 2 puffs into the lungs 2 (two) times daily. 1 Inhaler 3  . Calcium 200 MG TABS Take 600 mg by mouth daily.    . cholecalciferol (VITAMIN D3) 25 MCG (1000 UNIT) tablet Take 1,000 Units by mouth daily.    Marland Kitchen denosumab (PROLIA) 60 MG/ML SOSY injection Inject 60 mg into the skin every 6 (six) months.    . diltiazem (CARDIZEM CD) 180 MG 24 hr capsule Take 1 capsule (180 mg total) by mouth daily. 90 capsule 3  . furosemide (LASIX) 40 MG tablet Take 1 tablet (40 mg total) by mouth 2 (two) times daily. 180 tablet 3  . guaiFENesin (MUCINEX) 600 MG 12 hr tablet Take 600 mg by mouth 2 (two) times daily.    Marland Kitchen loratadine (CLARITIN) 10 MG tablet Take 10 mg by mouth daily.    . Melatonin 5 MG TABS Take 10 mg by mouth at bedtime.     Marland Kitchen omeprazole (PRILOSEC) 20 MG capsule Take 30- 60 min before your first and last meals of the day    . predniSONE (DELTASONE) 5 MG tablet Take 17.5mg  daily for 2 weeks then 15mg  daily until back in office. (Patient taking differently: 10 mg.  15mg  daily. Pt is o 10 mg) 90 tablet 1  . Tiotropium Bromide Monohydrate (SPIRIVA RESPIMAT) 2.5 MCG/ACT AERS Inhale 2 puffs into the lungs daily. 4 g 0   No current facility-administered medications on file prior to visit.   Past Medical History:  Diagnosis Date  . A-fib ([place])   . Anemia   . Atrial fibrillation ([place])   . Back pain   . COPD (chronic obstructive pulmonary disease) ([place])   . COPD (chronic obstructive pulmonary disease) ([place])   . Diabetes mellitus without complication ([place])   . Elevated PSA   . GAD (generalized anxiety disorder)   . High cholesterol   . Hypertension   . Osteoporosis    Past Surgical History:  Procedure Laterality Date  .  CATARACT EXTRACTION Right   . SKIN SURGERY     shoulders and chest    Family History  Problem Relation Age of Onset  . Heart disease Brother   . Heart disease Mother   . Heart disease Father   . Cancer Paternal Uncle    Social History   Socioeconomic History  . Marital status: Married    Spouse name: Not on file  . Number of children: 3  . Years of education: Not on file  . Highest education level: Not on file  Occupational History  . Occupation: retired    Comment: truck Geophysicist/field seismologist  Tobacco Use  . Smoking status: Former Smoker    Packs/day: 2.00    Years: 52.00    Pack years: 104.00    Types: Cigarettes    Quit date: 02/03/2009    Years since quitting: 11.8  . Smokeless tobacco: Never Used  . Tobacco comment: Counseled to remain smoke free  Vaping Use  . Vaping Use: Never used  Substance and Sexual Activity  . Alcohol use: Yes    Alcohol/week: 0.0 standard drinks    Comment: occ  . Drug use: No  . Sexual activity: Not on file  Other Topics Concern  . Not on file  Social History Narrative   Originally from [place]. Previously worked driving a Restaurant manager, fast food. No pets currently. No mold exposure. 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       wears sunscreen, brushes and flosses daily, see's dentist bi-annually, has smoke/carbon monoxide detectors, wears a seatbelt and practices gun safety      Social Determinants of Health   Financial Resource Strain:   . Difficulty of Paying Living Expenses: Not on file  Food Insecurity:   . Worried About Charity fundraiser in the Last Year: Not on file  . Ran Out of Food in the Last Year: Not on file  Transportation Needs:   . Lack of Transportation (Medical): Not on file  . Lack of Transportation (Non-Medical): Not on file  Physical Activity: Inactive  . Days of Exercise per Week: 0 days  . Minutes of Exercise per Session: 0 min  Stress:   . Feeling of Stress : Not  on file  Social Connections:   . Frequency of Communication with Friends and Family: Not on file  . Frequency of Social Gatherings with Friends and Family: Not on file  . Attends Religious Services: Not on file  . Active Member of Clubs or Organizations: Not on file  . Attends Archivist Meetings: Not on file  . Marital Status: Not on file    Review of Systems  Constitutional: Negative for chills, diaphoresis, fatigue and fever.  HENT: Negative for congestion, ear pain and sore throat.   Respiratory: Positive for cough (mild) and shortness of breath (with exertion. Wears oxygen at night 2 L).   Cardiovascular: Negative for chest pain and leg swelling.  Gastrointestinal: Negative for abdominal pain, constipation, diarrhea, nausea and vomiting.  Endocrine: Positive for polyuria. Negative for polydipsia and polyphagia.  Genitourinary: Negative for dysuria and urgency.  Musculoskeletal: Negative for arthralgias and myalgias.  Neurological: Negative for dizziness and headaches.  Psychiatric/Behavioral: Negative for dysphoric mood. The patient is not nervous/anxious.      Objective:  BP 110/60   Pulse 84   Temp (!) 97.2 F (36.2 C)   Resp 16   Ht 5\' 6"  (1.676 m)   Wt 159 lb (72.1 kg)   BMI 25.66 kg/m   BP/Weight 11/22/2020 03/50/0938 12/31/2991  Systolic BP 716 967 893  Diastolic BP 60 78 60  Wt. (Lbs) 159 155.8 154.6  BMI 25.66 25.15 24.95    Physical Exam Vitals reviewed.  Constitutional:      Appearance: Normal appearance.  Neck:     Vascular: No carotid bruit.  Cardiovascular:     Rate and Rhythm: Normal rate and regular rhythm.     Pulses: Normal pulses.     Heart sounds: Normal heart sounds.  Pulmonary:     Effort: Pulmonary effort is normal.     Breath sounds: Normal breath sounds. No wheezing, rhonchi or rales.  Abdominal:     General: Bowel sounds are normal.     Palpations: Abdomen is soft.     Tenderness: There is no abdominal tenderness.    Neurological:     Mental Status: He is alert and oriented to person, place, and time.  Psychiatric:        Mood and Affect: Mood normal.        Behavior: Behavior normal.     Lab Results  Component Value Date   WBC 9.8 11/20/2020   HGB 11.6 (L) 11/20/2020   HCT 36.9 (L) 11/20/2020   PLT 322 11/20/2020   GLUCOSE 98 11/20/2020   CHOL 172 11/20/2020   TRIG 123 11/20/2020   HDL 60 11/20/2020   LDLCALC 90 11/20/2020   ALT 27 11/20/2020   AST 18 11/20/2020   NA 144 11/20/2020   K 4.3 11/20/2020   CL 104 11/20/2020   CREATININE 0.69 (L) 11/20/2020   BUN 18 11/20/2020   CO2 27 11/20/2020   TSH 2.539 03/22/2019   INR 1.1 04/01/2020   HGBA1C 6.3 (H) 11/20/2020    Assessment & Plan:   1. Essential hypertension Well-controlled Recommend continue to work on eating healthy diet and exercise. - Comprehensive metabolic panel, Future  2.  Diabetes associated with hyperlipidemia  Do not recommend checking blood sugars.   Well-controlled.   Check microalbumin at next visit. Recommend continue to work on eating healthy diet and exercise. - CBC with Differential/Platelet  3. Chronic obstructive pulmonary disease, mixed ([place]) Fairly well controlled.  I gave the patient samples of Dulera in place of Symbicort when he runs out of Symbicort. Continue to work on eating a healthy diet and exercise.  Patient provided with Ruthe Mannan and Spiriva samples Management per specialist.   4. Chronic respiratory failure with hypoxia ([place]) Oxygen dependent.  5. Oxygen dependent Continue current oxygen.  Continuous at nighttime.  Intermittently during the day.  6. Age-related osteoporosis with current pathological fracture with routine healing, subsequent encounter Continue Prolia every  6 months.  Continue calcium with vitamin D. Continue exercising.  7. Chronic anemia Patient has had iron studies and 2019 which were normal.  I would add on iron studies as it has been 3 years since he has had  these.  2021.    8. Atherosclerosis of aorta ([place]) Continue current medications.  9. Paroxysmal atrial fibrillation ([facility]) Continue current medications.  73. Former cigarette smoker 11. BMI 25.0-25.9,adult  12. Mixed hyperlipidemia Recommend increase Lipitor to 80 mg once daily in light of atherosclerosis above.  Goal is to have LDL less than 70. - Lipid panel; Future  I spent 30 minutes dedicated to the care of this patient on the date of this encounter to include face-to-face time with the patient, as well as: Chart review of old EMR as well as cardiology notes, updating colonoscopy/Hemoccult cards.  Follow-up: Return in about 3 months (around 02/20/2021).  An After Visit Summary was printed and given to the patient.  [name], MD [facility] [phone]

## 2020-11-22 NOTE — Chronic Care Management (AMB) (Signed)
  Chronic Care Management   Note  11/22/2020 Name: Paul [name] MRN: [id number] DOB: 1946-06-25  Paul [name] is a 74 y.o. year old male who is a primary care patient of Cox, Kirsten, MD. I reached out to [name] by phone today in response to a referral sent by [name]'s PCP, Cox, Elnita Maxwell, MD.   [name] was given information about Chronic Care Management services today including:  1. CCM service includes personalized support from designated clinical staff supervised by his physician, including individualized plan of care and coordination with other care providers 2. 24/7 contact phone numbers for assistance for urgent and routine care needs. 3. Service will only be billed when office clinical staff spend 20 minutes or more in a month to coordinate care. 4. Only one practitioner may furnish and bill the service in a calendar month. 5. The patient may stop CCM services at any time (effective at the end of the month) by phone call to the office staff.   Juleen China verbally agreed to assistance and services provided by embedded care coordination/care management team today.  Follow up plan:   [place]

## 2020-11-23 DIAGNOSIS — L97822 Non-pressure chronic ulcer of other part of left lower leg with fat layer exposed: Secondary | ICD-10-CM | POA: Diagnosis not present

## 2020-11-23 DIAGNOSIS — I83018 Varicose veins of right lower extremity with ulcer other part of lower leg: Secondary | ICD-10-CM | POA: Diagnosis not present

## 2020-11-23 DIAGNOSIS — L97812 Non-pressure chronic ulcer of other part of right lower leg with fat layer exposed: Secondary | ICD-10-CM | POA: Diagnosis not present

## 2020-11-23 DIAGNOSIS — Z7952 Long term (current) use of systemic steroids: Secondary | ICD-10-CM | POA: Diagnosis not present

## 2020-11-23 DIAGNOSIS — I110 Hypertensive heart disease with heart failure: Secondary | ICD-10-CM | POA: Diagnosis not present

## 2020-11-23 DIAGNOSIS — I5032 Chronic diastolic (congestive) heart failure: Secondary | ICD-10-CM | POA: Diagnosis not present

## 2020-11-23 DIAGNOSIS — I83012 Varicose veins of right lower extremity with ulcer of calf: Secondary | ICD-10-CM | POA: Diagnosis not present

## 2020-11-23 DIAGNOSIS — L97212 Non-pressure chronic ulcer of right calf with fat layer exposed: Secondary | ICD-10-CM | POA: Diagnosis not present

## 2020-11-23 LAB — IRON AND TIBC
Iron Saturation: 29 % (ref 15–55)
Iron: 77 ug/dL (ref 38–169)
Total Iron Binding Capacity: 270 ug/dL (ref 250–450)
UIBC: 193 ug/dL (ref 111–343)

## 2020-11-23 LAB — FERRITIN: Ferritin: 142 ng/mL (ref 30–400)

## 2020-11-23 LAB — SPECIMEN STATUS REPORT

## 2020-12-01 ENCOUNTER — Telehealth: Payer: Self-pay | Admitting: Internal Medicine

## 2020-12-01 DIAGNOSIS — I10 Essential (primary) hypertension: Secondary | ICD-10-CM

## 2020-12-01 MED ORDER — AZITHROMYCIN 250 MG PO TABS: ORAL_TABLET | ORAL | 0 refills | Status: DC

## 2020-12-01 MED ORDER — BISOPROLOL FUMARATE 5 MG PO TABS: 5.0000 mg | ORAL_TABLET | Freq: Every day | ORAL | 0 refills | Status: DC

## 2020-12-01 MED ORDER — PREDNISONE 10 MG PO TABS: 10.0000 mg | ORAL_TABLET | Freq: Every day | ORAL | 0 refills | Status: DC

## 2020-12-01 NOTE — Telephone Encounter (Signed)
12/01/20  Called patient.  Reviewed that future refills for bisoprolol will need to be completed by patient's cardiologist Dr. Marlou Porch.  We will send in 30-day supply for patient.  Plan: Continue bisoprolol-30-day supply sent in today  Patient to contact Dr. Marlou Porch, his cardiologist for additional refills. Will route to Dr. Melvyn Novas and Dr. Marlou Porch as Juluis Rainier.   Nothing further needed  Wyn Quaker, FNP

## 2020-12-01 NOTE — Telephone Encounter (Signed)
Called spoke with patient.  States they are almost out of prednisone  So I refilled enough to get them to their next visit in January.  Sent is Zpak told patient to call next week if mucus still not clear. Wife voiced understanding Nothing further needed at this time.

## 2020-12-01 NOTE — Telephone Encounter (Signed)
Primary Pulmonologist: MW  Last office visit and with whom: October 2021 with MW What do we see them for (pulmonary problems): COPD Last OV assessment/plan: Make sure you check your oxygen saturations at highest level of activity to be sure it stays over 90% and adjust  02 flow upward to maintain this level if needed but remember to turn it back to previous settings when you stop (to conserve your supply).   Plan A = Automatic = Always=   symbicort and spiriva  First thing in am and then symbicort 12 hours later  Prednisone 10 mg daily   Plan B = Backup (to supplement plan A, not to replace it) Only use your albuterol (Proventil) inhaler as a rescue medication to be used if you can't catch your breath by resting or doing a relaxed purse lip breathing pattern.  - The less you use it, the better it will work when you need it. - Ok to use the inhaler up to 2 puffs  every 4 hours if you must but call for appointment if use goes up over your usual need - Don't leave home without it !!  (think of it like the spare tire for your car)   Plan C = Crisis (instead of Plan B but only if Plan B stops working) - only use your albuterol nebulizer if you first try Plan B and it fails to help > ok to use the nebulizer up to every 4 hours but if start needing it regularly call for immediate appointment  Go ahead and request the moderna booster this month  Please schedule a follow up visit in 3 months but call sooner if needed   Was appointment offered to patient (explain)?  No, wanted recommendations first    Reason for call: Spoke with patient's wife. She stated that the patient has had an increase in SOB for the past few days. He had a few months, since the end of August that his breathing had improved. He was using his albuterol once a week at the most, SOB was well controlled. Since the weather has changed, he has noticed a difference. He is still using 2L of O2, O2 levels are ranging from 90%-94%  while on O2. He is still taking prednisone 10mg  daily. Still using his Spirivia inhaler. Denies any fevers or body aches. He has a productive cough with a small amount of green phlegm.   Patient's wife is requesting to increase the prednisone to 20mg  daily for a few days to see if this will help. Patient is about to run out of prednisone so they will need a new RX.   Pharmacy is [facility] in [place].   MW, can you please advise? Thanks!    Allergies  Allergen Reactions  . Daliresp [Roflumilast]     Nausea, vomiting and diarrhea.  . Alendronate Other (See Comments)    Jittery/nervous    Immunization History  Administered Date(s) Administered  . Fluad Quad(high Dose 65+) 10/02/2020  . Influenza Split 09/22/2013, 09/01/2015  . Influenza Whole 09/04/2011, 09/21/2012  . Influenza, High Dose Seasonal PF 09/02/2016, 09/10/2017, 08/31/2018, 09/24/2018, 10/21/2019  . Influenza-Unspecified 08/23/2014  . Moderna SARS-COVID-2 Vaccination 01/17/2020, 02/14/2020, 10/06/2020  . Pneumococcal Conjugate-13 01/11/2014, 12/09/2016  . Pneumococcal Polysaccharide-23 08/23/2010, 08/29/2011  . Zoster Recombinat (Shingrix) 04/04/2014

## 2020-12-01 NOTE — Telephone Encounter (Signed)
That's fine take pred 10 mg x 2 daily until better then back to one daily   Also add a zpak and if mucus doesn't turn clear on this call back next week for additional recs

## 2020-12-02 ENCOUNTER — Encounter (HOSPITAL_BASED_OUTPATIENT_CLINIC_OR_DEPARTMENT_OTHER): Payer: Self-pay | Admitting: Emergency Medicine

## 2020-12-02 ENCOUNTER — Emergency Department (HOSPITAL_BASED_OUTPATIENT_CLINIC_OR_DEPARTMENT_OTHER)
Admission: EM | Admit: 2020-12-02 | Discharge: 2020-12-02 | Disposition: A | Discharge status: Home / Self Care | Payer: Medicare Other | Attending: Emergency Medicine | Admitting: Emergency Medicine

## 2020-12-02 ENCOUNTER — Other Ambulatory Visit: Payer: Self-pay

## 2020-12-02 ENCOUNTER — Emergency Department (HOSPITAL_BASED_OUTPATIENT_CLINIC_OR_DEPARTMENT_OTHER): Payer: Medicare Other

## 2020-12-02 DIAGNOSIS — I110 Hypertensive heart disease with heart failure: Secondary | ICD-10-CM | POA: Diagnosis not present

## 2020-12-02 DIAGNOSIS — I5032 Chronic diastolic (congestive) heart failure: Secondary | ICD-10-CM | POA: Insufficient documentation

## 2020-12-02 DIAGNOSIS — Z20822 Contact with and (suspected) exposure to covid-19: Secondary | ICD-10-CM | POA: Diagnosis not present

## 2020-12-02 DIAGNOSIS — J449 Chronic obstructive pulmonary disease, unspecified: Secondary | ICD-10-CM | POA: Diagnosis not present

## 2020-12-02 DIAGNOSIS — E119 Type 2 diabetes mellitus without complications: Secondary | ICD-10-CM | POA: Diagnosis not present

## 2020-12-02 DIAGNOSIS — Z79899 Other long term (current) drug therapy: Secondary | ICD-10-CM | POA: Diagnosis not present

## 2020-12-02 DIAGNOSIS — J441 Chronic obstructive pulmonary disease with (acute) exacerbation: Secondary | ICD-10-CM | POA: Diagnosis not present

## 2020-12-02 DIAGNOSIS — Z87891 Personal history of nicotine dependence: Secondary | ICD-10-CM | POA: Diagnosis not present

## 2020-12-02 DIAGNOSIS — J811 Chronic pulmonary edema: Secondary | ICD-10-CM | POA: Diagnosis not present

## 2020-12-02 DIAGNOSIS — Z7901 Long term (current) use of anticoagulants: Secondary | ICD-10-CM | POA: Diagnosis not present

## 2020-12-02 DIAGNOSIS — R0602 Shortness of breath: Secondary | ICD-10-CM | POA: Diagnosis not present

## 2020-12-02 LAB — BASIC METABOLIC PANEL
Anion gap: 13 (ref 5–15)
BUN: 18 mg/dL (ref 8–23)
CO2: 30 mmol/L (ref 22–32)
Calcium: 9.6 mg/dL (ref 8.9–10.3)
Chloride: 95 mmol/L — ABNORMAL LOW (ref 98–111)
Creatinine, Ser: 0.83 mg/dL (ref 0.61–1.24)
GFR, Estimated: >60 mL/min (ref >60)
Glucose, Bld: 158 mg/dL — ABNORMAL HIGH (ref 70–99)
Potassium: 4.3 mmol/L (ref 3.5–5.1)
Sodium: 138 mmol/L (ref 135–145)

## 2020-12-02 LAB — CBC WITH DIFFERENTIAL/PLATELET
Abs Immature Granulocytes: 0.02 10*3/uL (ref 0.00–0.07)
Basophils Absolute: 0 10*3/uL (ref 0.0–0.1)
Basophils Relative: 0 %
Eosinophils Absolute: 0 10*3/uL (ref 0.0–0.5)
Eosinophils Relative: 0 %
HCT: 42.2 % (ref 39.0–52.0)
Hemoglobin: 12.9 g/dL — ABNORMAL LOW (ref 13.0–17.0)
Immature Granulocytes: 0 %
Lymphocytes Relative: 9 %
Lymphs Abs: 0.8 10*3/uL (ref 0.7–4.0)
MCH: 24.7 pg — ABNORMAL LOW (ref 26.0–34.0)
MCHC: 30.6 g/dL (ref 30.0–36.0)
MCV: 80.8 fL (ref 80.0–100.0)
Monocytes Absolute: 0.4 10*3/uL (ref 0.1–1.0)
Monocytes Relative: 4 %
Neutro Abs: 7.2 10*3/uL (ref 1.7–7.7)
Neutrophils Relative %: 87 %
Platelets: 364 10*3/uL (ref 150–400)
RBC: 5.22 MIL/uL (ref 4.22–5.81)
RDW: 15.6 % — ABNORMAL HIGH (ref 11.5–15.5)
WBC: 8.3 10*3/uL (ref 4.0–10.5)
nRBC: 0 % (ref 0.0–0.2)

## 2020-12-02 LAB — RESP PANEL BY RT-PCR (FLU A&B, COVID) ARPGX2
Influenza A by PCR: NEGATIVE
Influenza B by PCR: NEGATIVE
SARS Coronavirus 2 by RT PCR: NEGATIVE

## 2020-12-02 MED ORDER — IPRATROPIUM-ALBUTEROL 0.5-2.5 (3) MG/3ML IN SOLN
RESPIRATORY_TRACT | Status: AC
  Administered 2020-12-02: 3 mL via RESPIRATORY_TRACT
  Filled 2020-12-02: qty 3

## 2020-12-02 MED ORDER — METHYLPREDNISOLONE SODIUM SUCC 125 MG IJ SOLR
80.0000 mg | Freq: Once | INTRAMUSCULAR | Status: AC
  Administered 2020-12-02: 80 mg via INTRAVENOUS
  Filled 2020-12-02: qty 2

## 2020-12-02 MED ORDER — SODIUM CHLORIDE 0.9 % IV SOLN
INTRAVENOUS | Status: DC | PRN
  Administered 2020-12-02: 1000 mL via INTRAVENOUS

## 2020-12-02 MED ORDER — IPRATROPIUM-ALBUTEROL 0.5-2.5 (3) MG/3ML IN SOLN
3.0000 mL | Freq: Once | RESPIRATORY_TRACT | Status: AC
  Administered 2020-12-02: 3 mL via RESPIRATORY_TRACT

## 2020-12-02 MED ORDER — IPRATROPIUM-ALBUTEROL 0.5-2.5 (3) MG/3ML IN SOLN
3.0000 mL | Freq: Once | RESPIRATORY_TRACT | Status: AC
  Administered 2020-12-02: 3 mL via RESPIRATORY_TRACT
  Filled 2020-12-02: qty 3

## 2020-12-02 MED ORDER — MAGNESIUM SULFATE 2 GM/50ML IV SOLN
2.0000 g | Freq: Once | INTRAVENOUS | Status: AC
  Administered 2020-12-02: 2 g via INTRAVENOUS
  Filled 2020-12-02: qty 50

## 2020-12-02 MED ORDER — IPRATROPIUM-ALBUTEROL 0.5-2.5 (3) MG/3ML IN SOLN: 3.0000 mL | Freq: Once | RESPIRATORY_TRACT | Status: AC

## 2020-12-02 NOTE — ED Notes (Signed)
PF pre neb tx was 100 with good pt effort x 3 and  150 post neb tx with great effort. RT will continue to monitor.

## 2020-12-02 NOTE — ED Notes (Signed)
No peripheral edema noted, denies any orthopnea or PND

## 2020-12-02 NOTE — ED Provider Notes (Signed)
[place] [facility] EMERGENCY DEPARTMENT Provider Note   CSN: [id number] Arrival date & time: 12/02/20  1605     History Chief Complaint  Patient presents with  . Shortness of Breath    Paul [name] is a 74 y.o. male presenting for evaluation of shortness of breath.  Patient states that the past 2 days, he has had gradually worsening shortness of breath.  He states this feels typical of his COPD exacerbation.  He called his doctor, who started him on a Z-Pak and increased his prednisone.  He also recently restarted his nebulizer treatments.  He feels he gets short-lived relief with this, however is continued to worsen.  He denies fevers, chills, new cough, chest pain, nausea, vomiting abdominal pain, leg pain or swelling.  He denies recent change in his medications.  He is on 2 L of oxygen at baseline, has not needed to increase this.  He states his shortness of breath is significant with exertion, minimal at rest.  HPI     Past Medical History:  Diagnosis Date  . A-fib ([place])   . Anemia   . Atrial fibrillation ([place])   . Back pain   . COPD (chronic obstructive pulmonary disease) ([place])   . COPD (chronic obstructive pulmonary disease) ([place])   . Diabetes mellitus without complication ([place])   . Elevated PSA   . GAD (generalized anxiety disorder)   . High cholesterol   . Hypertension   . Osteoporosis     Patient Active Problem List   Diagnosis Date Noted  . Syncope 04/01/2020  . Chronic diastolic CHF (congestive heart failure) ([place]) 04/01/2020  . Osteoporosis with current pathological fracture 02/06/2020  . Essential hypertension 01/28/2020  . Chronic bilateral thoracic back pain 01/25/2020  . 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 HLD (hyperlipidemia) 01/05/2020  . Vertebral compression fracture ([place]) 05/29/2019  . Chronic anemia 05/29/2019  . Pulmonary nodules 05/29/2019  . Coronary artery calcification 04/06/2019  . Atherosclerosis of aorta  ([place]) 04/06/2019  . Paroxysmal atrial fibrillation ([facility])   . Acute on chronic respiratory failure with hypoxia ([place])   . Lung nodule < 6cm on CT 05/29/2016  . Former cigarette smoker 12/05/2015  . COPD GOLD  III      Past Surgical History:  Procedure Laterality Date  . CATARACT EXTRACTION Right   . SKIN SURGERY     shoulders and chest       Family History  Problem Relation Age of Onset  . Heart disease Brother   . Heart disease Mother   . Heart disease Father   . Cancer Paternal Uncle     Social History   Tobacco Use  . Smoking status: Former Smoker    Packs/day: 2.00    Years: 52.00    Pack years: 104.00    Types: Cigarettes    Quit date: 02/03/2009    Years since quitting: 11.8  . Smokeless tobacco: Never Used  . Tobacco comment: Counseled to remain smoke free  Vaping Use  . Vaping Use: Never used  Substance Use Topics  . Alcohol use: Yes    Alcohol/week: 0.0 standard drinks    Comment: occ  . Drug use: No    Home Medications Prior to Admission medications   Medication Sig Start Date End Date Taking? Authorizing Provider  albuterol (PROAIR HFA) 108 (90 Base) MCG/ACT inhaler Inhale 2 puffs into the lungs every 6 (six) hours as needed for wheezing or shortness of breath.  [provider]  albuterol (PROVENTIL) (2.5 MG/3ML) 0.083% nebulizer solution Take 3 mLs (2.5 mg total) by nebulization in the morning and at bedtime. 05/09/20   Parrett, Fonnie Mu, NP  apixaban (ELIQUIS) 5 MG TABS tablet Take 1 tablet (5 mg total) by mouth 2 (two) times daily. 12/27/19   Jerline Pain, MD  atorvastatin (LIPITOR) 40 MG tablet TAKE 1 TABLET(40 MG) BY MOUTH DAILY AFTER SUPPER 12/27/19   Jerline Pain, MD  azithromycin (ZITHROMAX) 250 MG tablet Take 2 tablets (500mg  total) today,then 1 tablet (250mg ) x4 days 12/01/20   Tanda Rockers, MD  bisoprolol (ZEBETA) 5 MG tablet Take 1 tablet (5 mg total) by mouth daily. 12/01/20   Lauraine Rinne, NP  budesonide-formoterol  (SYMBICORT) 160-4.5 MCG/ACT inhaler Inhale 2 puffs into the lungs 2 (two) times daily. 07/25/20   CoxElnita Maxwell, MD  Calcium 200 MG TABS Take 600 mg by mouth daily.    [provider]  cholecalciferol (VITAMIN D3) 25 MCG (1000 UNIT) tablet Take 1,000 Units by mouth daily.    [provider]  denosumab (PROLIA) 60 MG/ML SOSY injection Inject 60 mg into the skin every 6 (six) months.    [provider]  diltiazem (CARDIZEM CD) 180 MG 24 hr capsule Take 1 capsule (180 mg total) by mouth daily. 05/03/20   Jerline Pain, MD  furosemide (LASIX) 40 MG tablet Take 1 tablet (40 mg total) by mouth 2 (two) times daily. 08/23/20   Jerline Pain, MD  guaiFENesin (MUCINEX) 600 MG 12 hr tablet Take 600 mg by mouth 2 (two) times daily.    [provider]  loratadine (CLARITIN) 10 MG tablet Take 10 mg by mouth daily.    [provider]  Melatonin 5 MG TABS Take 10 mg by mouth at bedtime.     [provider]  omeprazole (PRILOSEC) 20 MG capsule Take 30- 60 min before your first and last meals of the day 10/03/20   Tanda Rockers, MD  predniSONE (DELTASONE) 10 MG tablet Take 1 tablet (10 mg total) by mouth daily with breakfast. Take 2 tablets until better then back to 1 tablet daily 12/01/20   Tanda Rockers, MD  Tiotropium Bromide Monohydrate (SPIRIVA RESPIMAT) 2.5 MCG/ACT AERS Inhale 2 puffs into the lungs daily. 10/03/20   Tanda Rockers, MD    Allergies    Daliresp [roflumilast] and Alendronate  Review of Systems   Review of Systems  Respiratory: Positive for shortness of breath.   All other systems reviewed and are negative.   Physical Exam Updated Vital Signs BP (!) 143/81 (BP Location: Right Arm)   Pulse 83   Temp 97.8 F (36.6 C) (Oral)   Resp 20   Ht 5\' 6"  (1.676 m)   Wt 70.8 kg   SpO2 99%   BMI 25.18 kg/m   Physical Exam Vitals and nursing note reviewed.  Constitutional:      General: He is not in acute distress.    Appearance: He is  well-developed and well-nourished.     Comments: Increased work of breathing, otherwise nontoxic  HENT:     Head: Normocephalic and atraumatic.  Eyes:     Extraocular Movements: EOM normal.     Conjunctiva/sclera: Conjunctivae normal.     Pupils: Pupils are equal, round, and reactive to light.  Cardiovascular:     Rate and Rhythm: Normal rate and regular rhythm.     Pulses: Normal pulses and intact distal pulses.  Pulmonary:     Effort: Tachypnea and accessory muscle usage present. No respiratory distress.     Breath sounds: Examination of the right-lower field reveals decreased breath sounds. Examination of the left-lower field reveals decreased breath sounds. Decreased breath sounds present. No wheezing.     Comments: Speaking in short, 4-6 word sentences.  Mild tachypnea and accessory muscle use.  Diminished lung sounds, especially in the bases.  Sats stable on baseline O2 of 2L Abdominal:     General: There is no distension.     Palpations: Abdomen is soft. There is no mass.     Tenderness: There is no abdominal tenderness. There is no guarding or rebound.  Musculoskeletal:        General: Normal range of motion.     Cervical back: Normal range of motion and neck supple.     Right lower leg: No edema.     Left lower leg: No edema.  Skin:    General: Skin is warm and dry.  Neurological:     Mental Status: He is alert and oriented to person, place, and time.  Psychiatric:        Mood and Affect: Mood and affect normal.     ED Results / Procedures / Treatments   Labs (all labs ordered are listed, but only abnormal results are displayed) Labs Reviewed  CBC WITH DIFFERENTIAL/PLATELET - Abnormal; Notable for the following components:      Result Value   Hemoglobin 12.9 (*)    MCH 24.7 (*)    RDW 15.6 (*)    All other components within normal limits  BASIC METABOLIC PANEL - Abnormal; Notable for the following components:   Chloride 95 (*)    Glucose, Bld 158 (*)    All  other components within normal limits  RESP PANEL BY RT-PCR (FLU A&B, COVID) [id number]    EKG None  Radiology DG Chest Portable 1 View  Result Date: 12/02/2020 CLINICAL DATA:  Increased shortness of breath, history of COPD. EXAM: PORTABLE CHEST 1 VIEW COMPARISON:  Chest x-ray 04/01/2020 FINDINGS: The heart size and mediastinal contours are within normal limits. Aortic arch calcification. Flattening of the hemidiaphragms with hyperinflation of the lungs consistent with emphysematous changes. No focal consolidation. No pulmonary edema. No pleural effusion. No pneumothorax. No acute osseous abnormality. IMPRESSION: No active disease. Electronically Signed   By: Iven Finn M.D.   On: 12/02/2020 17:04    Procedures Procedures (including critical care time)  Medications Ordered in ED Medications  0.9 %  sodium chloride infusion (1,000 mLs Intravenous New Bag/Given 12/02/20 1700)  ipratropium-albuterol (DUONEB) 0.5-2.5 (3) MG/3ML nebulizer solution 3 mL (3 mLs Nebulization Given 12/02/20 1741)  magnesium sulfate IVPB 2 g 50 mL (0 g Intravenous Stopped 12/02/20 1843)  methylPREDNISolone sodium succinate (SOLU-MEDROL) 125 mg/2 mL injection 80 mg (80 mg Intravenous Given 12/02/20 1702)  ipratropium-albuterol (DUONEB) 0.5-2.5 (3) MG/3ML nebulizer solution 3 mL (3 mLs Nebulization Given 12/02/20 1755)  ipratropium-albuterol (DUONEB) 0.5-2.5 (3) MG/3ML nebulizer solution 3 mL (3 mLs Nebulization Given 12/02/20 1852)    ED Course  I have reviewed the triage vital signs and the nursing notes.  Pertinent labs & imaging results that were available during my care of the patient were reviewed by me and considered in my medical decision making (see chart for details).    MDM Rules/Calculators/A&P  Patient presenting for evaluation of shortness of breath.  On exam, he has mild increased work of breathing and speaking in short sentences.  However oxygen is overall reassuring.   No infectious symptoms including fever or cough, however in the setting of worsening shortness of breath will obtain x-ray to ensure no pneumonia or fluid in the lungs.  No chest pain or leg pain or swelling, as such, doubt PE or DVT.  Likely COPD exacerbation.  Will treat with neb treatment in the ED, mag, steroids, and reassess.  On reassessment, patient reports improvement of symptoms.  He now has mild expiratory wheezing in bilateral bases.  No longer speaking in short sentences.  Will give 1 further treatment and reassess with ambulation.  If patient does well, plan for discharge.  Patient ambulated without significant hypoxia, tachypnea, tachycardia, or increased work of breathing.  As such, plan for discharge with close follow-up with PCP.  Return precautions given.  Patient states he understands and agrees to plan  Final Clinical Impression(s) / ED Diagnoses Final diagnoses:  COPD exacerbation [facility])    Rx / DC Orders ED Discharge Orders    None       Franchot Heidelberg, PA-C 12/02/20 1953    Fredia Sorrow, MD 01/01/21 0001

## 2020-12-02 NOTE — ED Provider Notes (Signed)
ED ECG REPORT   Date: 12/02/2020  Rate: 84  Rhythm: normal sinus rhythm  QRS Axis: normal  Intervals: normal  ST/T Wave abnormalities: nonspecific ST/T changes  Conduction Disutrbances:none  Narrative Interpretation:   Old EKG Reviewed: none available Significant artifact. I have personally reviewed the EKG tracing and agree with the computerized printout as noted.    Fredia Sorrow, MD 12/02/20 1640

## 2020-12-02 NOTE — Discharge Instructions (Addendum)
Use your nebulizer treatment 4 times a day for the next 2 days.  Take 40 mg prednisone x2 days, then 30 mg x2 day, then 20 until symptoms have completely resolved, and then resume 10 mg daily.  Follow up closely with your primary care doctor for recheck of symptoms.  Return to the ER if you develop chest pain, increased difficulty breathing, fever, or any new, worsening, or concerning symptoms.

## 2020-12-02 NOTE — ED Triage Notes (Signed)
Reports hx of COPD with worsening SOB for the last few days.  Reports his home health nurse sent him due to decrease lung sounds in bases.  Wears O2 at 2 L all the time.  Used neb treatment at 11am.  On prednisone.

## 2020-12-02 NOTE — ED Notes (Signed)
Presents stating he has increased Shortness of Breath, his home RN advised him to come to the ED. Cont POX monitoring indicating 100% on 2lpm via [place], HR is NSR, able to speak without difficulty, breath sounds are decreased but clear, air movement heard very easily in bases.

## 2020-12-02 NOTE — Progress Notes (Signed)
Patient ambulated while on pulse ox and a 2 liters nasal cannula he wears per his home regimen at home.  Patient's SPO2 remained at 96% and HR remained in the low 90s.  Upon return to his room patient stated that he felt good and was not SOB.  Patient was able to speak clearly in complete sentences.

## 2020-12-02 NOTE — ED Notes (Signed)
ED PA at bedside

## 2020-12-03 NOTE — Telephone Encounter (Signed)
Ok to refill bisoprolol Thanks  Candee Furbish, MD

## 2020-12-04 MED ORDER — BISOPROLOL FUMARATE 5 MG PO TABS: 5.0000 mg | ORAL_TABLET | Freq: Every day | ORAL | 0 refills | Status: DC

## 2020-12-04 NOTE — Telephone Encounter (Signed)
Pts Bisoprolol refilled.

## 2020-12-06 ENCOUNTER — Telehealth: Payer: Self-pay | Admitting: Internal Medicine

## 2020-12-06 NOTE — Telephone Encounter (Signed)
Called patient and let him know Dr. Gustavus Bryant recommendations.  He voiced understanding Nothing further needed at this time

## 2020-12-06 NOTE — Telephone Encounter (Signed)
12/06/2020  And wanted to notify our office the patient to go to the emergency room at 12/02/2020  Patient was discharged on a prednisone taper of:  Prednisone 10mg  tablet  >>>4 tabs for 2 days, then 3 tabs for 2 days, 2 tabs for 2 days, then 1 tab for 2 days, then stop  Patient is wanting additional recommendations from Dr. Melvyn Novas.  He has an upcoming appointment with Dr. Melvyn Novas on 01/03/2021.  Patient reports that last year he had a difficult time tapering off of prednisone too quickly.  Patient is looking for additional recommendations as well as a slow prednisone taper with instructions from Dr. Melvyn Novas.  Dr. Melvyn Novas please advise  Paul Quaker, FNP

## 2020-12-06 NOTE — Telephone Encounter (Signed)
When gets to 20 mg daily would leave there until 100 % better then maintain 10 mg daily or whatever the lowest dose that previously worked was as I have it as 10 mg) until returns to regroup after  The holidays - if flare double to dose until better then back the the lowest dose again

## 2020-12-07 ENCOUNTER — Inpatient Hospital Stay (HOSPITAL_BASED_OUTPATIENT_CLINIC_OR_DEPARTMENT_OTHER)
Admission: EM | Admit: 2020-12-07 | Discharge: 2020-12-10 | DRG: 191 | Disposition: A | Discharge status: Home / Self Care | Payer: Medicare Other | Attending: Internal Medicine | Admitting: Internal Medicine

## 2020-12-07 ENCOUNTER — Other Ambulatory Visit: Payer: Self-pay

## 2020-12-07 ENCOUNTER — Emergency Department (HOSPITAL_BASED_OUTPATIENT_CLINIC_OR_DEPARTMENT_OTHER): Payer: Medicare Other

## 2020-12-07 ENCOUNTER — Encounter (HOSPITAL_BASED_OUTPATIENT_CLINIC_OR_DEPARTMENT_OTHER): Payer: Self-pay | Admitting: Emergency Medicine

## 2020-12-07 DIAGNOSIS — Z9981 Dependence on supplemental oxygen: Secondary | ICD-10-CM

## 2020-12-07 DIAGNOSIS — E785 Hyperlipidemia, unspecified: Secondary | ICD-10-CM | POA: Diagnosis present

## 2020-12-07 DIAGNOSIS — J441 Chronic obstructive pulmonary disease with (acute) exacerbation: Secondary | ICD-10-CM | POA: Diagnosis not present

## 2020-12-07 DIAGNOSIS — M810 Age-related osteoporosis without current pathological fracture: Secondary | ICD-10-CM | POA: Diagnosis present

## 2020-12-07 DIAGNOSIS — Z7901 Long term (current) use of anticoagulants: Secondary | ICD-10-CM

## 2020-12-07 DIAGNOSIS — R5381 Other malaise: Secondary | ICD-10-CM | POA: Diagnosis present

## 2020-12-07 DIAGNOSIS — J9611 Chronic respiratory failure with hypoxia: Secondary | ICD-10-CM | POA: Diagnosis present

## 2020-12-07 DIAGNOSIS — Z8249 Family history of ischemic heart disease and other diseases of the circulatory system: Secondary | ICD-10-CM

## 2020-12-07 DIAGNOSIS — Z20822 Contact with and (suspected) exposure to covid-19: Secondary | ICD-10-CM | POA: Diagnosis not present

## 2020-12-07 DIAGNOSIS — Z7951 Long term (current) use of inhaled steroids: Secondary | ICD-10-CM

## 2020-12-07 DIAGNOSIS — L97822 Non-pressure chronic ulcer of other part of left lower leg with fat layer exposed: Secondary | ICD-10-CM | POA: Diagnosis not present

## 2020-12-07 DIAGNOSIS — I5032 Chronic diastolic (congestive) heart failure: Secondary | ICD-10-CM | POA: Diagnosis present

## 2020-12-07 DIAGNOSIS — R739 Hyperglycemia, unspecified: Secondary | ICD-10-CM | POA: Diagnosis present

## 2020-12-07 DIAGNOSIS — E1159 Type 2 diabetes mellitus with other circulatory complications: Secondary | ICD-10-CM | POA: Diagnosis present

## 2020-12-07 DIAGNOSIS — E782 Mixed hyperlipidemia: Secondary | ICD-10-CM | POA: Diagnosis present

## 2020-12-07 DIAGNOSIS — Z87891 Personal history of nicotine dependence: Secondary | ICD-10-CM

## 2020-12-07 DIAGNOSIS — Z8639 Personal history of other endocrine, nutritional and metabolic disease: Secondary | ICD-10-CM

## 2020-12-07 DIAGNOSIS — Z79899 Other long term (current) drug therapy: Secondary | ICD-10-CM

## 2020-12-07 DIAGNOSIS — Z888 Allergy status to other drugs, medicaments and biological substances status: Secondary | ICD-10-CM

## 2020-12-07 DIAGNOSIS — I10 Essential (primary) hypertension: Secondary | ICD-10-CM | POA: Diagnosis not present

## 2020-12-07 DIAGNOSIS — I83012 Varicose veins of right lower extremity with ulcer of calf: Secondary | ICD-10-CM | POA: Diagnosis not present

## 2020-12-07 DIAGNOSIS — J90 Pleural effusion, not elsewhere classified: Secondary | ICD-10-CM | POA: Diagnosis not present

## 2020-12-07 DIAGNOSIS — I152 Hypertension secondary to endocrine disorders: Secondary | ICD-10-CM | POA: Diagnosis present

## 2020-12-07 DIAGNOSIS — Z872 Personal history of diseases of the skin and subcutaneous tissue: Secondary | ICD-10-CM | POA: Diagnosis not present

## 2020-12-07 DIAGNOSIS — I480 Paroxysmal atrial fibrillation: Secondary | ICD-10-CM | POA: Diagnosis present

## 2020-12-07 DIAGNOSIS — Z7952 Long term (current) use of systemic steroids: Secondary | ICD-10-CM | POA: Diagnosis not present

## 2020-12-07 DIAGNOSIS — E7800 Pure hypercholesterolemia, unspecified: Secondary | ICD-10-CM | POA: Diagnosis present

## 2020-12-07 DIAGNOSIS — F419 Anxiety disorder, unspecified: Secondary | ICD-10-CM | POA: Diagnosis present

## 2020-12-07 DIAGNOSIS — L97212 Non-pressure chronic ulcer of right calf with fat layer exposed: Secondary | ICD-10-CM | POA: Diagnosis not present

## 2020-12-07 DIAGNOSIS — T380X5A Adverse effect of glucocorticoids and synthetic analogues, initial encounter: Secondary | ICD-10-CM | POA: Diagnosis present

## 2020-12-07 DIAGNOSIS — J449 Chronic obstructive pulmonary disease, unspecified: Secondary | ICD-10-CM | POA: Diagnosis not present

## 2020-12-07 DIAGNOSIS — I110 Hypertensive heart disease with heart failure: Secondary | ICD-10-CM | POA: Diagnosis present

## 2020-12-07 DIAGNOSIS — R0602 Shortness of breath: Secondary | ICD-10-CM | POA: Diagnosis not present

## 2020-12-07 MED ORDER — MAGNESIUM SULFATE 2 GM/50ML IV SOLN
2.0000 g | Freq: Once | INTRAVENOUS | Status: AC
  Administered 2020-12-07: 2 g via INTRAVENOUS
  Filled 2020-12-07: qty 50

## 2020-12-07 MED ORDER — ALBUTEROL (5 MG/ML) CONTINUOUS INHALATION SOLN
10.0000 mg/h | INHALATION_SOLUTION | RESPIRATORY_TRACT | Status: AC
  Administered 2020-12-07: 10 mg/h via RESPIRATORY_TRACT
  Filled 2020-12-07: qty 20

## 2020-12-07 MED ORDER — IPRATROPIUM BROMIDE 0.02 % IN SOLN
0.5000 mg | Freq: Once | RESPIRATORY_TRACT | Status: AC
  Administered 2020-12-07: 0.5 mg via RESPIRATORY_TRACT
  Filled 2020-12-07: qty 2.5

## 2020-12-07 MED ORDER — SODIUM CHLORIDE 0.9 % IV SOLN: INTRAVENOUS | Status: DC | PRN

## 2020-12-07 NOTE — ED Provider Notes (Addendum)
[facility] Provider Note: Georgena Spurling, Paul [name], Paul [name]  CSN: [id number] MRN: [id number] ARRIVAL: 12/07/20 at [place]: [place] [facility]   HISTORY OF PRESENT ILLNESS  12/07/20 11:37 PM Paul [name] is a 74 y.o. male with COPD on oxygen 2 L by nasal cannula at night.  He is here with an acute worsening of his shortness of breath about 7 PM today.  He states the symptoms were severe and he describes it as not being able to pull in a normal good deep breath.  Symptoms were worse with any exertion.  He does not lie flat because that exacerbates his dyspnea.  He denies any associated chest pain, fever, Covid-like symptoms, nausea or vomiting.  He was assessed by respiratory therapist on arrival found him to be tachypneic with pursed lip breathing.  He was started on albuterol and Atrovent neb treatment which she states is improving his dyspnea.  He was seen in this department 5 days ago and was discharged on a prednisone taper.  He is currently on 30 mg daily.   Past Medical History:  Diagnosis Date  . A-fib ([place])   . Anemia   . Atrial fibrillation ([place])   . Back pain   . COPD (chronic obstructive pulmonary disease) ([place])   . Diabetes mellitus without complication ([place])   . Elevated PSA   . GAD (generalized anxiety disorder)   . High cholesterol   . Hypertension   . Osteoporosis     Past Surgical History:  Procedure Laterality Date  . CATARACT EXTRACTION Right   . SKIN SURGERY     shoulders and chest    Family History  Problem Relation Age of Onset  . Heart disease Brother   . Heart disease Mother   . Heart disease Father   . Cancer Paternal Uncle     Social History   Tobacco Use  . Smoking status: Former Smoker    Packs/day: 2.00    Years: 52.00    Pack years: 104.00    Types: Cigarettes    Quit date: 02/03/2009    Years since quitting: 11.8  . Smokeless tobacco: Never Used  . Tobacco comment: Counseled to remain smoke  free  Vaping Use  . Vaping Use: Never used  Substance Use Topics  . Alcohol use: Yes    Alcohol/week: 0.0 standard drinks    Comment: occ  . Drug use: No    Prior to Admission medications   Medication Sig Start Date End Date Taking? Authorizing Provider  albuterol (PROAIR HFA) 108 (90 Base) MCG/ACT inhaler Inhale 2 puffs into the lungs every 6 (six) hours as needed for wheezing or shortness of breath.     Provider, Historical, Paul [name]  albuterol (PROVENTIL) (2.5 MG/3ML) 0.083% nebulizer solution Take 3 mLs (2.5 mg total) by nebulization in the morning and at bedtime. 05/09/20   Parrett, Fonnie Mu, NP  apixaban (ELIQUIS) 5 MG TABS tablet Take 1 tablet (5 mg total) by mouth 2 (two) times daily. 12/27/19   Jerline Pain, Paul [name]  atorvastatin (LIPITOR) 40 MG tablet TAKE 1 TABLET(40 MG) BY MOUTH DAILY AFTER SUPPER 12/27/19   Jerline Pain, Paul [name]  azithromycin (ZITHROMAX) 250 MG tablet Take 2 tablets (500mg  total) today,then 1 tablet (250mg ) x4 days 12/01/20   Tanda Rockers, Paul [name]  bisoprolol (ZEBETA) 5 MG tablet Take 1 tablet (5 mg total) by mouth daily. 12/04/20   Jerline Pain, Paul [name]  budesonide-formoterol (SYMBICORT) 160-4.5  MCG/ACT inhaler Inhale 2 puffs into the lungs 2 (two) times daily. 07/25/20   CoxElnita Maxwell, Paul [name]  Calcium 200 MG TABS Take 600 mg by mouth daily.    Provider, Historical, Paul [name]  cholecalciferol (VITAMIN D3) 25 MCG (1000 UNIT) tablet Take 1,000 Units by mouth daily.    Provider, Historical, Paul [name]  denosumab (PROLIA) 60 MG/ML SOSY injection Inject 60 mg into the skin every 6 (six) months.    Provider, Historical, Paul [name]  diltiazem (CARDIZEM CD) 180 MG 24 hr capsule Take 1 capsule (180 mg total) by mouth daily. 05/03/20   Jerline Pain, Paul [name]  furosemide (LASIX) 40 MG tablet Take 1 tablet (40 mg total) by mouth 2 (two) times daily. 08/23/20   Jerline Pain, Paul [name]  guaiFENesin (MUCINEX) 600 MG 12 hr tablet Take 600 mg by mouth 2 (two) times daily.    Provider, Historical, Paul [name]  loratadine (CLARITIN) 10 MG tablet Take  10 mg by mouth daily.    Provider, Historical, Paul [name]  Melatonin 5 MG TABS Take 10 mg by mouth at bedtime.     Provider, Historical, Paul [name]  omeprazole (PRILOSEC) 20 MG capsule Take 30- 60 min before your first and last meals of the day 10/03/20   Tanda Rockers, Paul [name]  predniSONE (DELTASONE) 10 MG tablet Take 1 tablet (10 mg total) by mouth daily with breakfast. Take 2 tablets until better then back to 1 tablet daily 12/01/20   Tanda Rockers, Paul [name]  Tiotropium Bromide Monohydrate (SPIRIVA RESPIMAT) 2.5 MCG/ACT AERS Inhale 2 puffs into the lungs daily. 10/03/20   Tanda Rockers, Paul [name]    Allergies Daliresp [roflumilast] and Alendronate   REVIEW OF SYSTEMS  Negative except as noted here or in the History of Present Illness.   PHYSICAL EXAMINATION  Initial Vital Signs Blood pressure (!) 146/75, pulse 93, resp. rate 18, SpO2 98 %.  Examination General: Well-developed, well-nourished male in no acute distress; appearance consistent with age of record HENT: normocephalic; atraumatic Eyes: pupils equal, round and reactive to light; extraocular muscles intact Neck: supple Heart: regular rate and rhythm Lungs: Distant sounds Abdomen: soft; nondistended; nontender; bowel sounds present Extremities: No deformity; full range of motion; pulses normal Neurologic: Awake, alert and oriented; motor function intact in all extremities and symmetric; no facial droop Skin: Warm and dry Psychiatric: Normal mood and affect   RESULTS  Summary of this visit's results, reviewed and interpreted by myself:   EKG Interpretation  Date/Time:  Thursday December 07 2020 23:18:42 EST Ventricular Rate:  92 PR Interval:    QRS Duration: 92 QT Interval:  361 QTC Calculation: 447 R Axis:   76 Text Interpretation: Sinus rhythm Artifact Confirmed by Chennel Olivos [phone]) on 12/07/2020 11:32:02 PM      Laboratory Studies: Results for orders placed or performed during the hospital encounter of 12/07/20 (from the past 24  hour(s))  CBC with Differential/Platelet     Status: Abnormal   Collection Time: 12/07/20 11:21 PM  Result Value Ref Range   WBC 11.6 (H) 4.0 - 10.5 K/uL   RBC 5.13 4.22 - 5.81 MIL/uL   Hemoglobin 12.7 (L) 13.0 - 17.0 g/dL   HCT 41.5 39.0 - 52.0 %   MCV 80.9 80.0 - 100.0 fL   MCH 24.8 (L) 26.0 - 34.0 pg   MCHC 30.6 30.0 - 36.0 g/dL   RDW 15.4 11.5 - 15.5 %   Platelets 394 150 - 400 K/uL   nRBC 0.0 0.0 - 0.2 %   Neutrophils Relative %  78 %   Neutro Abs 9.1 (H) 1.7 - 7.7 K/uL   Lymphocytes Relative 14 %   Lymphs Abs 1.6 0.7 - 4.0 K/uL   Monocytes Relative 8 %   Monocytes Absolute 0.9 0.1 - 1.0 K/uL   Eosinophils Relative 0 %   Eosinophils Absolute 0.0 0.0 - 0.5 K/uL   Basophils Relative 0 %   Basophils Absolute 0.0 0.0 - 0.1 K/uL   Immature Granulocytes 0 %   Abs Immature Granulocytes 0.04 0.00 - 0.07 K/uL  Basic metabolic panel     Status: Abnormal   Collection Time: 12/07/20 11:21 PM  Result Value Ref Range   Sodium 139 135 - 145 mmol/L   Potassium 4.4 3.5 - 5.1 mmol/L   Chloride 99 98 - 111 mmol/L   CO2 29 22 - 32 mmol/L   Glucose, Bld 129 (H) 70 - 99 mg/dL   BUN 19 8 - 23 mg/dL   Creatinine, Ser 0.96 0.61 - 1.24 mg/dL   Calcium 9.2 8.9 - 10.3 mg/dL   GFR, Estimated >60 >60 mL/min   Anion gap 11 5 - 15  Resp Panel by RT-PCR (Flu A&B, Covid) Nasopharyngeal Swab     Status: None   Collection Time: 12/07/20 11:59 PM   Specimen: Nasopharyngeal Swab; Nasopharyngeal(NP) swabs in vial transport medium  Result Value Ref Range   SARS Coronavirus 2 by RT PCR NEGATIVE NEGATIVE   Influenza A by PCR NEGATIVE NEGATIVE   Influenza B by PCR NEGATIVE NEGATIVE   Imaging Studies: DG Chest Port 1 View  Result Date: 12/08/2020 CLINICAL DATA:  Shortness of breath EXAM: PORTABLE CHEST 1 VIEW COMPARISON:  12/02/2020 FINDINGS: There is hyperinflation of the lungs compatible with COPD. Heart and mediastinal contours are within normal limits. No focal opacities or effusions. No acute bony  abnormality. Aortic atherosclerosis IMPRESSION: COPD.  No active disease. Electronically Signed   By: Rolm Baptise M.D.   On: 12/08/2020 00:08    ED COURSE and MDM  Nursing notes, initial and subsequent vitals signs, including pulse oximetry, reviewed and interpreted by myself.  Vitals:   12/08/20 0030 12/08/20 0037 12/08/20 0122 12/08/20 0200  BP: (!) 152/72  (!) 151/72 131/77  Pulse: 91  98 (!) 102  Resp: 20  20 17   TempSrc:   Oral   SpO2: 99% 98% 100% 100%   Medications  albuterol (PROVENTIL,VENTOLIN) solution continuous neb (0 mg/hr Nebulization Stopped 12/08/20 0036)  0.9 %  sodium chloride infusion ( Intravenous Stopped 12/08/20 0230)  albuterol (PROVENTIL,VENTOLIN) solution continuous neb (0 mg/hr Nebulization Stopped 12/08/20 0206)  ipratropium (ATROVENT) nebulizer solution 0.5 mg (0.5 mg Nebulization Given 12/07/20 2323)  magnesium sulfate IVPB 2 g 50 mL (0 g Intravenous Stopped 12/08/20 0230)   2:18 AM Patient states his breathing feels nearly back to normal.  He is able to take full breaths and he feels they are moving in his lower lungs.  Patient would like to be discharged home at this time.  He was advised to continue his current steroid taper.  2:42 AM Patient attempted to ambulate and became dyspneic.  He and ED staff agree that he would best be served by admission at this time.  3:50 AM Dr. Myna Hidalgo accepts for admittance to hospitalist service.   PROCEDURES  Procedures  CRITICAL CARE Performed by: Karen Chafe Herron Fero Total critical care time: 30 minutes Critical care time was exclusive of separately billable procedures and treating other patients. Critical care was necessary to treat or prevent imminent or  life-threatening deterioration. Critical care was time spent personally by me on the following activities: development of treatment plan with patient and/or surrogate as well as nursing, discussions with consultants, evaluation of patient's response to treatment,  examination of patient, obtaining history from patient or surrogate, ordering and performing treatments and interventions, ordering and review of laboratory studies, ordering and review of radiographic studies, pulse oximetry and re-evaluation of patient's condition.   ED DIAGNOSES     ICD-10-CM   1. COPD exacerbation [facility])  J44.1    '   Kjerstin Abrigo, Paul [name] 12/08/20 1580    Shanon Rosser, Paul [name] 12/08/20 [phone]

## 2020-12-07 NOTE — ED Triage Notes (Signed)
Increased SOB since 1900, seen here on Saturday for same. Home 02 at 2l/\mn. Pursed lip breathing. Sat. 97% on 2l/[place]. RT at bedside. Skin pink. Good cap refill.

## 2020-12-07 NOTE — ED Notes (Signed)
RT at bedside to initiate CAT Tx

## 2020-12-07 NOTE — ED Notes (Signed)
Presents with chest tightness, states he feels like he did this past recent ED visit "having problems getting my air in". Is on home oxygen at night when sleeping. Denies any fevers. Has increased home nebulizer tx as per his last ED AVS instructions.

## 2020-12-08 DIAGNOSIS — M810 Age-related osteoporosis without current pathological fracture: Secondary | ICD-10-CM | POA: Diagnosis not present

## 2020-12-08 DIAGNOSIS — E7800 Pure hypercholesterolemia, unspecified: Secondary | ICD-10-CM | POA: Diagnosis not present

## 2020-12-08 DIAGNOSIS — Z7951 Long term (current) use of inhaled steroids: Secondary | ICD-10-CM | POA: Diagnosis not present

## 2020-12-08 DIAGNOSIS — Z9981 Dependence on supplemental oxygen: Secondary | ICD-10-CM | POA: Diagnosis not present

## 2020-12-08 DIAGNOSIS — R739 Hyperglycemia, unspecified: Secondary | ICD-10-CM | POA: Diagnosis not present

## 2020-12-08 DIAGNOSIS — T380X5A Adverse effect of glucocorticoids and synthetic analogues, initial encounter: Secondary | ICD-10-CM | POA: Diagnosis not present

## 2020-12-08 DIAGNOSIS — F419 Anxiety disorder, unspecified: Secondary | ICD-10-CM | POA: Diagnosis not present

## 2020-12-08 DIAGNOSIS — E785 Hyperlipidemia, unspecified: Secondary | ICD-10-CM | POA: Diagnosis not present

## 2020-12-08 DIAGNOSIS — J9611 Chronic respiratory failure with hypoxia: Secondary | ICD-10-CM | POA: Diagnosis not present

## 2020-12-08 DIAGNOSIS — E782 Mixed hyperlipidemia: Secondary | ICD-10-CM | POA: Diagnosis not present

## 2020-12-08 DIAGNOSIS — I10 Essential (primary) hypertension: Secondary | ICD-10-CM | POA: Diagnosis not present

## 2020-12-08 DIAGNOSIS — J90 Pleural effusion, not elsewhere classified: Secondary | ICD-10-CM | POA: Diagnosis not present

## 2020-12-08 DIAGNOSIS — Z888 Allergy status to other drugs, medicaments and biological substances status: Secondary | ICD-10-CM | POA: Diagnosis not present

## 2020-12-08 DIAGNOSIS — Z8249 Family history of ischemic heart disease and other diseases of the circulatory system: Secondary | ICD-10-CM | POA: Diagnosis not present

## 2020-12-08 DIAGNOSIS — I5032 Chronic diastolic (congestive) heart failure: Secondary | ICD-10-CM

## 2020-12-08 DIAGNOSIS — I480 Paroxysmal atrial fibrillation: Secondary | ICD-10-CM

## 2020-12-08 DIAGNOSIS — J441 Chronic obstructive pulmonary disease with (acute) exacerbation: Secondary | ICD-10-CM | POA: Diagnosis present

## 2020-12-08 DIAGNOSIS — Z7901 Long term (current) use of anticoagulants: Secondary | ICD-10-CM | POA: Diagnosis not present

## 2020-12-08 DIAGNOSIS — I110 Hypertensive heart disease with heart failure: Secondary | ICD-10-CM | POA: Diagnosis not present

## 2020-12-08 DIAGNOSIS — R5381 Other malaise: Secondary | ICD-10-CM | POA: Diagnosis not present

## 2020-12-08 DIAGNOSIS — Z7952 Long term (current) use of systemic steroids: Secondary | ICD-10-CM | POA: Diagnosis not present

## 2020-12-08 DIAGNOSIS — J449 Chronic obstructive pulmonary disease, unspecified: Secondary | ICD-10-CM | POA: Diagnosis not present

## 2020-12-08 DIAGNOSIS — Z8639 Personal history of other endocrine, nutritional and metabolic disease: Secondary | ICD-10-CM | POA: Diagnosis not present

## 2020-12-08 DIAGNOSIS — Z87891 Personal history of nicotine dependence: Secondary | ICD-10-CM | POA: Diagnosis not present

## 2020-12-08 DIAGNOSIS — R0602 Shortness of breath: Secondary | ICD-10-CM | POA: Diagnosis not present

## 2020-12-08 DIAGNOSIS — Z20822 Contact with and (suspected) exposure to covid-19: Secondary | ICD-10-CM | POA: Diagnosis not present

## 2020-12-08 DIAGNOSIS — Z79899 Other long term (current) drug therapy: Secondary | ICD-10-CM | POA: Diagnosis not present

## 2020-12-08 LAB — RESP PANEL BY RT-PCR (FLU A&B, COVID) ARPGX2
Influenza A by PCR: NEGATIVE
Influenza B by PCR: NEGATIVE
SARS Coronavirus 2 by RT PCR: NEGATIVE

## 2020-12-08 LAB — CBC WITH DIFFERENTIAL/PLATELET
Abs Immature Granulocytes: 0.04 10*3/uL (ref 0.00–0.07)
Basophils Absolute: 0 10*3/uL (ref 0.0–0.1)
Basophils Relative: 0 %
Eosinophils Absolute: 0 10*3/uL (ref 0.0–0.5)
Eosinophils Relative: 0 %
HCT: 41.5 % (ref 39.0–52.0)
Hemoglobin: 12.7 g/dL — ABNORMAL LOW (ref 13.0–17.0)
Immature Granulocytes: 0 %
Lymphocytes Relative: 14 %
Lymphs Abs: 1.6 10*3/uL (ref 0.7–4.0)
MCH: 24.8 pg — ABNORMAL LOW (ref 26.0–34.0)
MCHC: 30.6 g/dL (ref 30.0–36.0)
MCV: 80.9 fL (ref 80.0–100.0)
Monocytes Absolute: 0.9 10*3/uL (ref 0.1–1.0)
Monocytes Relative: 8 %
Neutro Abs: 9.1 10*3/uL — ABNORMAL HIGH (ref 1.7–7.7)
Neutrophils Relative %: 78 %
Platelets: 394 10*3/uL (ref 150–400)
RBC: 5.13 MIL/uL (ref 4.22–5.81)
RDW: 15.4 % (ref 11.5–15.5)
WBC: 11.6 10*3/uL — ABNORMAL HIGH (ref 4.0–10.5)
nRBC: 0 % (ref 0.0–0.2)

## 2020-12-08 LAB — BASIC METABOLIC PANEL
Anion gap: 11 (ref 5–15)
Anion gap: 11 (ref 5–15)
BUN: 19 mg/dL (ref 8–23)
BUN: 15 mg/dL (ref 8–23)
CO2: 29 mmol/L (ref 22–32)
CO2: 32 mmol/L (ref 22–32)
Calcium: 9.2 mg/dL (ref 8.9–10.3)
Calcium: 8.5 mg/dL — ABNORMAL LOW (ref 8.9–10.3)
Chloride: 99 mmol/L (ref 98–111)
Chloride: 99 mmol/L (ref 98–111)
Creatinine, Ser: 0.96 mg/dL (ref 0.61–1.24)
Creatinine, Ser: 0.83 mg/dL (ref 0.61–1.24)
GFR, Estimated: >60 mL/min (ref >60)
GFR, Estimated: >60 mL/min (ref >60)
Glucose, Bld: 129 mg/dL — ABNORMAL HIGH (ref 70–99)
Glucose, Bld: 122 mg/dL — ABNORMAL HIGH (ref 70–99)
Potassium: 4.4 mmol/L (ref 3.5–5.1)
Potassium: 3.9 mmol/L (ref 3.5–5.1)
Sodium: 139 mmol/L (ref 135–145)
Sodium: 142 mmol/L (ref 135–145)

## 2020-12-08 LAB — CBC
HCT: 40.1 % (ref 39.0–52.0)
Hemoglobin: 12 g/dL — ABNORMAL LOW (ref 13.0–17.0)
MCH: 24.9 pg — ABNORMAL LOW (ref 26.0–34.0)
MCHC: 29.9 g/dL — ABNORMAL LOW (ref 30.0–36.0)
MCV: 83.2 fL (ref 80.0–100.0)
Platelets: 338 10*3/uL (ref 150–400)
RBC: 4.82 MIL/uL (ref 4.22–5.81)
RDW: 15.9 % — ABNORMAL HIGH (ref 11.5–15.5)
WBC: 12.5 10*3/uL — ABNORMAL HIGH (ref 4.0–10.5)
nRBC: 0 % (ref 0.0–0.2)

## 2020-12-08 LAB — GLUCOSE, CAPILLARY
Glucose-Capillary: 151 mg/dL — ABNORMAL HIGH (ref 70–99)
Glucose-Capillary: 148 mg/dL — ABNORMAL HIGH (ref 70–99)

## 2020-12-08 MED ORDER — DILTIAZEM HCL ER COATED BEADS 180 MG PO CP24
180.0000 mg | ORAL_CAPSULE | Freq: Every day | ORAL | Status: DC
  Administered 2020-12-08 – 2020-12-10 (×3): 180 mg via ORAL
  Filled 2020-12-08 (×4): qty 1

## 2020-12-08 MED ORDER — MELATONIN 5 MG PO TABS
10.0000 mg | ORAL_TABLET | Freq: Every day | ORAL | Status: DC
Start: 2020-12-08 — End: 2020-12-10
  Administered 2020-12-08 – 2020-12-09 (×2): 10 mg via ORAL
  Filled 2020-12-08 (×3): qty 2

## 2020-12-08 MED ORDER — IPRATROPIUM-ALBUTEROL 0.5-2.5 (3) MG/3ML IN SOLN
RESPIRATORY_TRACT | Status: AC
  Filled 2020-12-08: qty 3

## 2020-12-08 MED ORDER — VITAMIN D 25 MCG (1000 UNIT) PO TABS
1000.0000 [IU] | ORAL_TABLET | Freq: Every day | ORAL | Status: DC
  Administered 2020-12-08 – 2020-12-10 (×3): 1000 [IU] via ORAL
  Filled 2020-12-08 (×4): qty 1

## 2020-12-08 MED ORDER — PREDNISONE 20 MG PO TABS: 40.0000 mg | ORAL_TABLET | Freq: Every day | ORAL | Status: DC

## 2020-12-08 MED ORDER — IPRATROPIUM-ALBUTEROL 0.5-2.5 (3) MG/3ML IN SOLN
3.0000 mL | Freq: Four times a day (QID) | RESPIRATORY_TRACT | Status: DC
  Administered 2020-12-08 – 2020-12-10 (×6): 3 mL via RESPIRATORY_TRACT
  Filled 2020-12-08 (×6): qty 3

## 2020-12-08 MED ORDER — BISOPROLOL FUMARATE 5 MG PO TABS
5.0000 mg | ORAL_TABLET | Freq: Every day | ORAL | Status: DC
  Administered 2020-12-08 – 2020-12-10 (×3): 5 mg via ORAL
  Filled 2020-12-08 (×4): qty 1

## 2020-12-08 MED ORDER — MOMETASONE FURO-FORMOTEROL FUM 200-5 MCG/ACT IN AERO
2.0000 | INHALATION_SPRAY | Freq: Two times a day (BID) | RESPIRATORY_TRACT | Status: DC
  Administered 2020-12-08 – 2020-12-10 (×4): 2 via RESPIRATORY_TRACT
  Filled 2020-12-08 (×2): qty 8.8

## 2020-12-08 MED ORDER — PREDNISONE 20 MG PO TABS
40.0000 mg | ORAL_TABLET | Freq: Once | ORAL | Status: AC
  Administered 2020-12-08: 40 mg via ORAL
  Filled 2020-12-08: qty 2

## 2020-12-08 MED ORDER — ACETAMINOPHEN 325 MG PO TABS: 650.0000 mg | ORAL_TABLET | Freq: Four times a day (QID) | ORAL | Status: DC | PRN

## 2020-12-08 MED ORDER — ALBUTEROL (5 MG/ML) CONTINUOUS INHALATION SOLN
10.0000 mg/h | INHALATION_SOLUTION | RESPIRATORY_TRACT | Status: AC
  Administered 2020-12-08: 10 mg/h via RESPIRATORY_TRACT

## 2020-12-08 MED ORDER — ALBUTEROL SULFATE (2.5 MG/3ML) 0.083% IN NEBU: 2.5000 mg | INHALATION_SOLUTION | RESPIRATORY_TRACT | Status: DC | PRN

## 2020-12-08 MED ORDER — ACETAMINOPHEN 650 MG RE SUPP: 650.0000 mg | Freq: Four times a day (QID) | RECTAL | Status: DC | PRN

## 2020-12-08 MED ORDER — ATORVASTATIN CALCIUM 40 MG PO TABS
40.0000 mg | ORAL_TABLET | Freq: Every day | ORAL | Status: DC
  Administered 2020-12-08 – 2020-12-09 (×2): 40 mg via ORAL
  Filled 2020-12-08 (×2): qty 1

## 2020-12-08 MED ORDER — POLYETHYLENE GLYCOL 3350 17 G PO PACK
17.0000 g | PACK | Freq: Every day | ORAL | Status: DC | PRN
Start: 2020-12-08 — End: 2020-12-10

## 2020-12-08 MED ORDER — INSULIN ASPART 100 UNIT/ML ~~LOC~~ SOLN
0.0000 [IU] | Freq: Three times a day (TID) | SUBCUTANEOUS | Status: DC
  Administered 2020-12-08: 2 [IU] via SUBCUTANEOUS
  Administered 2020-12-09 (×2): 1 [IU] via SUBCUTANEOUS
  Administered 2020-12-09: 2 [IU] via SUBCUTANEOUS
  Administered 2020-12-10 (×2): 1 [IU] via SUBCUTANEOUS

## 2020-12-08 MED ORDER — UMECLIDINIUM BROMIDE 62.5 MCG/INH IN AEPB
1.0000 | INHALATION_SPRAY | Freq: Every day | RESPIRATORY_TRACT | Status: DC
  Administered 2020-12-09 – 2020-12-10 (×2): 1 via RESPIRATORY_TRACT
  Filled 2020-12-08 (×2): qty 7

## 2020-12-08 MED ORDER — ALBUTEROL SULFATE (2.5 MG/3ML) 0.083% IN NEBU
2.5000 mg | INHALATION_SOLUTION | RESPIRATORY_TRACT | Status: DC | PRN
  Administered 2020-12-08: 2.5 mg via RESPIRATORY_TRACT
  Filled 2020-12-08: qty 3

## 2020-12-08 MED ORDER — LORATADINE 10 MG PO TABS
10.0000 mg | ORAL_TABLET | Freq: Every day | ORAL | Status: DC
  Administered 2020-12-08 – 2020-12-10 (×3): 10 mg via ORAL
  Filled 2020-12-08 (×3): qty 1

## 2020-12-08 MED ORDER — GUAIFENESIN ER 600 MG PO TB12
600.0000 mg | ORAL_TABLET | Freq: Two times a day (BID) | ORAL | Status: DC
  Administered 2020-12-08 – 2020-12-10 (×5): 600 mg via ORAL
  Filled 2020-12-08 (×6): qty 1

## 2020-12-08 MED ORDER — PANTOPRAZOLE SODIUM 40 MG PO TBEC
40.0000 mg | DELAYED_RELEASE_TABLET | Freq: Two times a day (BID) | ORAL | Status: DC
  Administered 2020-12-08 – 2020-12-10 (×4): 40 mg via ORAL
  Filled 2020-12-08 (×4): qty 1

## 2020-12-08 MED ORDER — METHYLPREDNISOLONE SODIUM SUCC 40 MG IJ SOLR
40.0000 mg | Freq: Four times a day (QID) | INTRAMUSCULAR | Status: AC
  Administered 2020-12-08 – 2020-12-09 (×4): 40 mg via INTRAVENOUS
  Filled 2020-12-08 (×4): qty 1

## 2020-12-08 MED ORDER — IPRATROPIUM-ALBUTEROL 0.5-2.5 (3) MG/3ML IN SOLN: 3.0000 mL | Freq: Four times a day (QID) | RESPIRATORY_TRACT | Status: DC

## 2020-12-08 MED ORDER — FUROSEMIDE 40 MG PO TABS
40.0000 mg | ORAL_TABLET | Freq: Two times a day (BID) | ORAL | Status: DC
  Administered 2020-12-08 – 2020-12-10 (×4): 40 mg via ORAL
  Filled 2020-12-08 (×4): qty 1

## 2020-12-08 MED ORDER — CALCIUM CARBONATE ANTACID 500 MG PO CHEW
500.0000 mg | CHEWABLE_TABLET | Freq: Every day | ORAL | Status: DC
  Administered 2020-12-08 – 2020-12-10 (×3): 500 mg via ORAL
  Filled 2020-12-08 (×3): qty 3

## 2020-12-08 MED ORDER — APIXABAN 5 MG PO TABS
5.0000 mg | ORAL_TABLET | Freq: Two times a day (BID) | ORAL | Status: DC
  Administered 2020-12-08 – 2020-12-10 (×5): 5 mg via ORAL
  Filled 2020-12-08: qty 2
  Filled 2020-12-08 (×4): qty 1

## 2020-12-08 NOTE — ED Notes (Signed)
Patient oxygen decreased to 2L [place],  oxygen saturations of 99%. Patient tolerating well.

## 2020-12-08 NOTE — ED Notes (Signed)
Report received from [facility]

## 2020-12-08 NOTE — ED Notes (Signed)
Report given to [facility] 

## 2020-12-08 NOTE — ED Notes (Signed)
Called [facility], RN unable to take report at this time, will call back

## 2020-12-08 NOTE — ED Notes (Addendum)
Up to wheelchair for discharge and patient became significantly more short of breath. Doesn't feel comfortable going home. Plan for admission. SpO2 84% on 2L after getting up, quickly recovered to 95% however work of breathing increased.

## 2020-12-08 NOTE — Evaluation (Signed)
Physical Therapy Evaluation Patient Details Name: Paul [name] MRN: [id number] DOB: 02-16-1946 Today's Date: 12/08/2020   History of Present Illness  Pt is 74 yo male with PMH of afib, resp failure, COPD on 2 L O2 nightly, HTN, hyperlipidemia, and anxiety.  He presented with worsening shortness of breath and admitted with COPD exacerbation .  Clinical Impression  Pt admitted with above diagnosis.  He was able to demonstrate safe gait and transfers.  He did have DOE of 3/4 toward the end of his walk but O2 sats were stable on 2 LPM O2.  Pt demonstrated safe dynamic balance and demonstrates mobility safe to ambulate in room independently - provided oxygen extension so pt could get to bathroom.  Pt has had COPD exacerbation and described safe progression back to normal activity.  No acute PT needs - pt agrees.     Follow Up Recommendations No PT follow up    Equipment Recommendations  None recommended by PT    Recommendations for Other Services       Precautions / Restrictions Precautions Precautions: None      Mobility  Bed Mobility Overal bed mobility: Independent                  Transfers Overall transfer level: Needs assistance   Transfers: Sit to/from Stand Sit to Stand: Supervision         General transfer comment: able to do safely  Ambulation/Gait Ambulation/Gait assistance: Supervision Gait Distance (Feet): 250 Feet Assistive device: None Gait Pattern/deviations: Step-through pattern Gait velocity: normal   General Gait Details: Normal gait with good balance; did have DOE of 3/4 but sats were 95% on  2 LPM o2  Stairs            Wheelchair Mobility    Modified Rankin (Stroke Patients Only)       Balance Overall balance assessment: Independent   Sitting balance-Leahy Scale: Normal     Standing balance support: No upper extremity supported Standing balance-Leahy Scale: Good               High level balance activites: Side  stepping;Backward walking;Direction changes;Sudden stops;Turns;Head turns High Level Balance Comments: stepped over objects             Pertinent Vitals/Pain Pain Assessment: No/denies pain    Home Living Family/patient expects to be discharged to:: Private residence Living Arrangements: Spouse/significant other Available Help at Discharge: Family;Available 24 hours/day Type of Home: House Home Access: Level entry     Home Layout: One level Home Equipment: Shower seat - built in;Walker - 4 wheels;Grab bars - tub/shower Additional Comments: likes to watch and play golf    Prior Function Level of Independence: Needs assistance   Gait / Transfers Assistance Needed: Able to ambulate community distances, plays golf, walks on treadmill at gym  ADL's / Homemaking Assistance Needed: independent  Comments: Sleeps with O2 at night 2 L     Hand Dominance        Extremity/Trunk Assessment   Upper Extremity Assessment Upper Extremity Assessment: Overall WFL for tasks assessed    Lower Extremity Assessment Lower Extremity Assessment: Overall WFL for tasks assessed    Cervical / Trunk Assessment Cervical / Trunk Assessment: Normal  Communication   Communication: No difficulties  Cognition Arousal/Alertness: Awake/alert Behavior During Therapy: WFL for tasks assessed/performed Overall Cognitive Status: Within Functional Limits for tasks assessed  General Comments General comments (skin integrity, edema, etc.): Pt reports has had COPD exacerbations in past and always builds back up gradually using his rollator, progressing to no AD, then progressing back to gym and golf.    Exercises     Assessment/Plan    PT Assessment Patent does not need any further PT services  PT Problem List         PT Treatment Interventions      PT Goals (Current goals can be found in the Care Plan section)  Acute Rehab PT  Goals Patient Stated Goal: return home PT Goal Formulation: All assessment and education complete, DC therapy    Frequency     Barriers to discharge        Co-evaluation               AM-PAC PT "6 Clicks" Mobility  Outcome Measure Help needed turning from your back to your side while in a flat bed without using bedrails?: None Help needed moving from lying on your back to sitting on the side of a flat bed without using bedrails?: None Help needed moving to and from a bed to a chair (including a wheelchair)?: None Help needed standing up from a chair using your arms (e.g., wheelchair or bedside chair)?: None Help needed to walk in hospital room?: None Help needed climbing 3-5 steps with a railing? : None 6 Click Score: 24    End of Session Equipment Utilized During Treatment: Oxygen Activity Tolerance: Patient tolerated treatment well Patient left: in bed;with call bell/phone within reach Nurse Communication: Mobility status      Time: 2060-1561 PT Time Calculation (min) (ACUTE ONLY): 19 min   Charges:   PT Evaluation $PT Eval Low Complexity: 1 Low          Joette Schmoker, PT Acute Rehab Services Pager [phone] [facility] [phone]    Karlton Lemon 12/08/2020, 5:42 PM

## 2020-12-08 NOTE — ED Provider Notes (Signed)
I have evaluated patient as he is currently pending admission to [facility] but no bed is available.  The patient feels better at rest but knows that he is very dyspneic on walking.  Slight basilar wheezing on exam.  No distress otherwise.  Will put in as needed albuterol while he is boarding here as well as give a dose of steroids this morning.   Sherwood Gambler, MD 12/08/20 [phone]

## 2020-12-08 NOTE — ED Notes (Signed)
Patient albuterol CAT completed. Patient oxygen saturations 98% on 2L. Patient able to talk in complete sentences without difficulty. Patient RR 17, HR 98. Patient tolerated well.

## 2020-12-08 NOTE — ED Notes (Signed)
COVID SWAB OBTAINED AND TO THE LAB 

## 2020-12-08 NOTE — H&P (Signed)
History and Physical        Hospital Admission Note Date: 12/08/2020  Patient name: Paul [name] Medical record number: [id number] Date of birth: 01-18-1946 Age: 74 y.o. Gender: male  PCP: Cox, Elnita Maxwell, MD  Patient coming from: Home Lives with: Wife At baseline, ambulates: Independently  Chief Complaint    Chief Complaint  Patient presents with  . Shortness of Breath      HPI:   This is a 74 year old male with past medical history of atrial fibrillation on anticoagulation, chronic hypoxic respiratory failure secondary to COPD on 2 L O2 nightly and chronic steroids, hypertension, hyperlipidemia, anxiety who presented to the [facility] ED for worsening shortness of breath which started several days ago and has been worsening since last night.  Patient's pulmonologist, Dr. Melvyn Novas, advised prednisone 10 mg twice daily then back to once daily when feeling better and added a Z-Pak on 12/10 however the patient has not started this regimen just yet.  States that he frequently gets COPD exacerbations in the winter time.  Denies any fever, sick contacts, chest pain.  States that he is feeling better since initially presenting to the ED.  ED Course: Afebrile, tachycardic, hemodynamically stable, placed on 4 L/min.  Was weaned down to baseline two LPM notable Labs: BMP unremarkable, WBC 11.6, Hb 12.7, COVID-19 and flu negative. Notable Imaging: CXR unremarkable for active disease. Patient received albuterol continuous nebulizer, magnesium sulfate.  Symptoms improved in the ED and tolerated baseline two LPM O2 however O2 sat dropped to 84% when standing up and quickly recovered to 95%.  He was more short of breath upon ambulation in the ED and was admitted to [facility] for further treatment.   Vitals:   12/08/20 1213 12/08/20 1317  BP:  (!) 147/62  Pulse:  90  Resp:  18  Temp:  98 F (36.7 C)  SpO2: 100%  99%     Review of Systems:  Review of Systems  All other systems reviewed and are negative.   Medical/Social/Family History   Past Medical History: Past Medical History:  Diagnosis Date  . A-fib ([place])   . Anemia   . Atrial fibrillation ([place])   . Back pain   . COPD (chronic obstructive pulmonary disease) ([place])   . Diabetes mellitus without complication ([place])   . Elevated PSA   . GAD (generalized anxiety disorder)   . High cholesterol   . Hypertension   . Osteoporosis     Past Surgical History:  Procedure Laterality Date  . CATARACT EXTRACTION Right   . SKIN SURGERY     shoulders and chest    Medications: Prior to Admission medications   Medication Sig Start Date End Date Taking? Authorizing Provider  albuterol (PROAIR HFA) 108 (90 Base) MCG/ACT inhaler Inhale 2 puffs into the lungs every 6 (six) hours as needed for wheezing or shortness of breath.    Yes [provider]  albuterol (PROVENTIL) (2.5 MG/3ML) 0.083% nebulizer solution Take 3 mLs (2.5 mg total) by nebulization in the morning and at bedtime. 05/09/20  Yes Parrett, Fonnie Mu, NP  apixaban (ELIQUIS) 5 MG TABS tablet Take 1 tablet (5 mg total) by mouth 2 (two) times daily. 12/27/19  Yes Skains,  Thana Farr, MD  atorvastatin (LIPITOR) 40 MG tablet TAKE 1 TABLET(40 MG) BY MOUTH DAILY AFTER SUPPER Patient taking differently: Take 40 mg by mouth daily. 12/27/19  Yes Jerline Pain, MD  bisoprolol (ZEBETA) 5 MG tablet Take 1 tablet (5 mg total) by mouth daily. 12/04/20  Yes Jerline Pain, MD  budesonide-formoterol (SYMBICORT) 160-4.5 MCG/ACT inhaler Inhale 2 puffs into the lungs 2 (two) times daily. 07/25/20  Yes Cox, Kirsten, MD  calcium carbonate (OS-CAL) 600 MG TABS tablet Take 600 mg by mouth daily.   Yes [provider]  cholecalciferol (VITAMIN D3) 25 MCG (1000 UNIT) tablet Take 1,000 Units by mouth daily.   Yes [provider]  denosumab (PROLIA) 60 MG/ML SOSY injection Inject 60 mg into the skin  every 6 (six) months.   Yes [provider]  diltiazem (CARDIZEM CD) 180 MG 24 hr capsule Take 1 capsule (180 mg total) by mouth daily. 05/03/20  Yes Jerline Pain, MD  furosemide (LASIX) 40 MG tablet Take 1 tablet (40 mg total) by mouth 2 (two) times daily. 08/23/20  Yes Jerline Pain, MD  guaiFENesin (MUCINEX) 600 MG 12 hr tablet Take 600 mg by mouth 2 (two) times daily.   Yes [provider]  loratadine (CLARITIN) 10 MG tablet Take 10 mg by mouth daily.   Yes [provider]  Melatonin 5 MG TABS Take 10 mg by mouth at bedtime as needed (sleep).   Yes [provider]  omeprazole (PRILOSEC) 20 MG capsule Take 30- 60 min before your first and last meals of the day Patient taking differently: Take 20 mg by mouth daily as needed (acid reflux). 10/03/20  Yes Tanda Rockers, MD  predniSONE (DELTASONE) 10 MG tablet Take 1 tablet (10 mg total) by mouth daily with breakfast. Take 2 tablets until better then back to 1 tablet daily Patient taking differently: Take 30 mg by mouth daily with breakfast. 12/01/20  Yes Tanda Rockers, MD  Tiotropium Bromide Monohydrate (SPIRIVA RESPIMAT) 2.5 MCG/ACT AERS Inhale 2 puffs into the lungs daily. 10/03/20  Yes Tanda Rockers, MD  azithromycin (ZITHROMAX) 250 MG tablet Take 2 tablets (500mg  total) today,then 1 tablet (250mg ) x4 days Patient taking differently: Take 250 mg by mouth See admin instructions. Start date : 12/03/20 12/01/20   Tanda Rockers, MD    Allergies:   Allergies  Allergen Reactions  . Daliresp [Roflumilast]     Nausea, vomiting and diarrhea.  . Alendronate Other (See Comments)    Jittery/nervous    Social History:  reports that he quit smoking about 11 years ago. His smoking use included cigarettes. He has a 104.00 pack-year smoking history. He has never used smokeless tobacco. He reports current alcohol use. He reports that he does not use drugs.  Family History: Family History  Problem Relation Age  of Onset  . Heart disease Brother   . Heart disease Mother   . Heart disease Father   . Cancer Paternal Uncle      Objective   Physical Exam: Blood pressure (!) 147/62, pulse 90, temperature 98 F (36.7 C), temperature source Oral, resp. rate 18, height 5\' 6"  (1.676 m), weight 70.2 kg, SpO2 99 %.  Physical Exam Vitals and nursing note reviewed.  Constitutional:      Appearance: Normal appearance.  HENT:     Head: Normocephalic and atraumatic.  Eyes:     Conjunctiva/sclera: Conjunctivae normal.  Cardiovascular:     Rate and Rhythm: Normal  rate and regular rhythm.  Pulmonary:     Effort: Pulmonary effort is normal.     Breath sounds: Wheezing present.  Abdominal:     General: Abdomen is flat.     Palpations: Abdomen is soft.  Musculoskeletal:        General: No swelling or tenderness.  Skin:    Coloration: Skin is not jaundiced or pale.  Neurological:     Mental Status: He is alert. Mental status is at baseline.  Psychiatric:        Mood and Affect: Mood normal.        Behavior: Behavior normal.     LABS on Admission: I have personally reviewed all the labs and imaging below    Basic Metabolic Panel: Recent Labs  Lab 12/07/20 2321 12/08/20 1337  NA 139 142  K 4.4 3.9  CL 99 99  CO2 29 32  GLUCOSE 129* 122*  BUN 19 15  CREATININE 0.96 0.83  CALCIUM 9.2 8.5*   Liver Function Tests: No results for input(s): AST, ALT, ALKPHOS, BILITOT, PROT, ALBUMIN in the last 168 hours. No results for input(s): LIPASE, AMYLASE in the last 168 hours. No results for input(s): AMMONIA in the last 168 hours. CBC: Recent Labs  Lab 12/07/20 2321 12/08/20 1337  WBC 11.6* 12.5*  NEUTROABS 9.1*  --   HGB 12.7* 12.0*  HCT 41.5 40.1  MCV 80.9 83.2  PLT 394 338   Cardiac Enzymes: No results for input(s): CKTOTAL, CKMB, CKMBINDEX, TROPONINI in the last 168 hours. BNP: Invalid input(s): POCBNP CBG: No results for input(s): GLUCAP in the last 168 hours.  Radiological  Exams on Admission:  DG Chest Port 1 View  Result Date: 12/08/2020 CLINICAL DATA:  Shortness of breath EXAM: PORTABLE CHEST 1 VIEW COMPARISON:  12/02/2020 FINDINGS: There is hyperinflation of the lungs compatible with COPD. Heart and mediastinal contours are within normal limits. No focal opacities or effusions. No acute bony abnormality. Aortic atherosclerosis IMPRESSION: COPD.  No active disease. Electronically Signed   By: Rolm Baptise M.D.   On: 12/08/2020 00:08      EKG: Independently reviewed   A & P   Principal Problem:   COPD with acute exacerbation ([place]) Active Problems:   Paroxysmal atrial fibrillation ([facility])   HLD (hyperlipidemia)   Essential hypertension   Chronic diastolic CHF (congestive heart failure) ([place])   1. COPD exacerbation, likely viral induced a. COVID-19 negative b. Continue with Dulera, Incruse Ellipta add standing duo nebs, as needed albuterol and steroids c. Tolerates room air at rest at baseline and on 2 LPM nightly.  Titrate O2 as appropriate d. Reports hyperglycemia with steroids in the past - sensitive sliding scale  2. Debility a. PT eval  3. Atrial fibrillation, rate controlled a. Continue bisoprolol, Cardizem and Eliquis  4. Hypertension a. Meds as above  5. Hyperlipidemia a. Continue statin   DVT prophylaxis: Eliquis   Code Status: Full Code  Diet: Carb modified Family Communication: Admission, patients condition and plan of care including tests being ordered have been discussed with the patient who indicates understanding and agrees with the plan and Code Status. Patient's wife was updated  Disposition Plan: The appropriate patient status for this patient is OBSERVATION. Observation status is judged to be reasonable and necessary in order to provide the required intensity of service to ensure the patient's safety. The patient's presenting symptoms, physical exam findings, and initial radiographic and laboratory data in the context of  their medical condition is felt  to place them at decreased risk for further clinical deterioration. Furthermore, it is anticipated that the patient will be medically stable for discharge from the hospital within 2 midnights of admission. The following factors support the patient status of observation.   " The patient's presenting symptoms include shortness of breath. " The physical exam findings include wheeze. " The initial radiographic and laboratory data are unremarkable.    Status is: Observation  The patient remains OBS appropriate and will d/c before 2 midnights.  Dispo: The patient is from: Home              Anticipated d/c is to: Home              Anticipated d/c date is: 1 day              Patient currently is not medically stable to d/c.   Consultants  . None  Procedures  . None  Time Spent on Admission: 60 minutes    Harold Hedge, DO [facility]  12/08/2020, 4:25 PM

## 2020-12-09 DIAGNOSIS — Z7901 Long term (current) use of anticoagulants: Secondary | ICD-10-CM | POA: Diagnosis not present

## 2020-12-09 DIAGNOSIS — Z87891 Personal history of nicotine dependence: Secondary | ICD-10-CM | POA: Diagnosis not present

## 2020-12-09 DIAGNOSIS — Z79899 Other long term (current) drug therapy: Secondary | ICD-10-CM | POA: Diagnosis not present

## 2020-12-09 DIAGNOSIS — Z20822 Contact with and (suspected) exposure to covid-19: Secondary | ICD-10-CM | POA: Diagnosis present

## 2020-12-09 DIAGNOSIS — Z7951 Long term (current) use of inhaled steroids: Secondary | ICD-10-CM | POA: Diagnosis not present

## 2020-12-09 DIAGNOSIS — E7800 Pure hypercholesterolemia, unspecified: Secondary | ICD-10-CM | POA: Diagnosis present

## 2020-12-09 DIAGNOSIS — R5381 Other malaise: Secondary | ICD-10-CM | POA: Diagnosis present

## 2020-12-09 DIAGNOSIS — E785 Hyperlipidemia, unspecified: Secondary | ICD-10-CM | POA: Diagnosis present

## 2020-12-09 DIAGNOSIS — Z888 Allergy status to other drugs, medicaments and biological substances status: Secondary | ICD-10-CM | POA: Diagnosis not present

## 2020-12-09 DIAGNOSIS — Z8249 Family history of ischemic heart disease and other diseases of the circulatory system: Secondary | ICD-10-CM | POA: Diagnosis not present

## 2020-12-09 DIAGNOSIS — Z9981 Dependence on supplemental oxygen: Secondary | ICD-10-CM | POA: Diagnosis not present

## 2020-12-09 DIAGNOSIS — R739 Hyperglycemia, unspecified: Secondary | ICD-10-CM | POA: Diagnosis present

## 2020-12-09 DIAGNOSIS — M810 Age-related osteoporosis without current pathological fracture: Secondary | ICD-10-CM | POA: Diagnosis present

## 2020-12-09 DIAGNOSIS — I480 Paroxysmal atrial fibrillation: Secondary | ICD-10-CM | POA: Diagnosis present

## 2020-12-09 DIAGNOSIS — J441 Chronic obstructive pulmonary disease with (acute) exacerbation: Secondary | ICD-10-CM | POA: Diagnosis present

## 2020-12-09 DIAGNOSIS — I10 Essential (primary) hypertension: Secondary | ICD-10-CM | POA: Diagnosis not present

## 2020-12-09 DIAGNOSIS — Z8639 Personal history of other endocrine, nutritional and metabolic disease: Secondary | ICD-10-CM | POA: Diagnosis not present

## 2020-12-09 DIAGNOSIS — I110 Hypertensive heart disease with heart failure: Secondary | ICD-10-CM | POA: Diagnosis present

## 2020-12-09 DIAGNOSIS — I5032 Chronic diastolic (congestive) heart failure: Secondary | ICD-10-CM | POA: Diagnosis not present

## 2020-12-09 DIAGNOSIS — Z7952 Long term (current) use of systemic steroids: Secondary | ICD-10-CM | POA: Diagnosis not present

## 2020-12-09 DIAGNOSIS — J9611 Chronic respiratory failure with hypoxia: Secondary | ICD-10-CM | POA: Diagnosis present

## 2020-12-09 DIAGNOSIS — T380X5A Adverse effect of glucocorticoids and synthetic analogues, initial encounter: Secondary | ICD-10-CM | POA: Diagnosis present

## 2020-12-09 DIAGNOSIS — F419 Anxiety disorder, unspecified: Secondary | ICD-10-CM | POA: Diagnosis present

## 2020-12-09 LAB — CBC
HCT: 38.6 % — ABNORMAL LOW (ref 39.0–52.0)
Hemoglobin: 11.7 g/dL — ABNORMAL LOW (ref 13.0–17.0)
MCH: 24.7 pg — ABNORMAL LOW (ref 26.0–34.0)
MCHC: 30.3 g/dL (ref 30.0–36.0)
MCV: 81.4 fL (ref 80.0–100.0)
Platelets: 307 10*3/uL (ref 150–400)
RBC: 4.74 MIL/uL (ref 4.22–5.81)
RDW: 15.5 % (ref 11.5–15.5)
WBC: 12.3 10*3/uL — ABNORMAL HIGH (ref 4.0–10.5)
nRBC: 0 % (ref 0.0–0.2)

## 2020-12-09 LAB — GLUCOSE, CAPILLARY
Glucose-Capillary: 143 mg/dL — ABNORMAL HIGH (ref 70–99)
Glucose-Capillary: 167 mg/dL — ABNORMAL HIGH (ref 70–99)
Glucose-Capillary: 143 mg/dL — ABNORMAL HIGH (ref 70–99)
Glucose-Capillary: 159 mg/dL — ABNORMAL HIGH (ref 70–99)

## 2020-12-09 LAB — BASIC METABOLIC PANEL
Anion gap: 12 (ref 5–15)
BUN: 22 mg/dL (ref 8–23)
CO2: 29 mmol/L (ref 22–32)
Calcium: 8.4 mg/dL — ABNORMAL LOW (ref 8.9–10.3)
Chloride: 99 mmol/L (ref 98–111)
Creatinine, Ser: 0.81 mg/dL (ref 0.61–1.24)
GFR, Estimated: >60 mL/min (ref >60)
Glucose, Bld: 152 mg/dL — ABNORMAL HIGH (ref 70–99)
Potassium: 4.1 mmol/L (ref 3.5–5.1)
Sodium: 140 mmol/L (ref 135–145)

## 2020-12-09 MED ORDER — METHYLPREDNISOLONE SODIUM SUCC 40 MG IJ SOLR
40.0000 mg | Freq: Two times a day (BID) | INTRAMUSCULAR | Status: DC
  Administered 2020-12-09 – 2020-12-10 (×2): 40 mg via INTRAVENOUS
  Filled 2020-12-09 (×2): qty 1

## 2020-12-09 NOTE — Progress Notes (Signed)
SATURATION QUALIFICATIONS: (This note is used to comply with regulatory documentation for home oxygen)  Patient Saturations on Room Air at Rest = 84%  Patient Saturations on Room Air while Ambulating = 83%  Patient Saturations on 2 Liters of oxygen while Ambulating = 94%  Please briefly explain why patient needs home oxygen:Pt will need oxygen at home  He desats  on Room Air

## 2020-12-09 NOTE — Progress Notes (Signed)
PROGRESS NOTE    Paul [name]  ZOX:[id number] DOB: 1946-11-16 DOA: 12/07/2020 PCP: [name], MD    Brief Narrative:  74 year old male with past medical history of atrial fibrillation on anticoagulation, chronic hypoxic respiratory failure secondary to COPD on 2 L O2 nightly and chronic steroids, hypertension, hyperlipidemia, anxiety who presented to the [facility] ED for worsening shortness of breath which started several days ago and has been worsening since last night.  Patient's pulmonologist, Dr. Melvyn Novas, advised prednisone 10 mg twice daily then back to once daily when feeling better and added a Z-Pak on 12/10 however the patient has not started this regimen just yet.  States that he frequently gets COPD exacerbations in the winter time.  Denies any fever, sick contacts, chest pain.   Assessment & Plan:   Principal Problem:   COPD with acute exacerbation ([place]) Active Problems:   Paroxysmal atrial fibrillation ([facility])   HLD (hyperlipidemia)   Essential hypertension   Chronic diastolic CHF (congestive heart failure) ([place])  1. COPD exacerbation, likely viral induced a. COVID-19 negative b. Continue with Dulera, Incruse Ellipta add standing duo nebs, as needed albuterol and steroids c. Tolerates room air at rest at baseline and on 2 LPM nightly.  Titrate O2 as appropriate d. Reports hyperglycemia with steroids in the past - sensitive sliding scale e. At rest is on RA however on trial of ambulation, pt did desaturate to the mid to low 80's, requiring continued O2 f. Will continue scheduled IV solumedrol   2. Debility a. PT eval requested with no PT needs identified  3. Atrial fibrillation, rate controlled a. Continue bisoprolol, Cardizem and Eliquis as tolerated  4. Hypertension a. Cont current home meds as tolerated  5. Hyperlipidemia a. Continue statin   DVT prophylaxis: Eliquis Code Status: Full Family Communication: Pt in room, family at bedside  Status is:  Observation  The patient will require care spanning > 2 midnights and should be moved to inpatient because: IV treatments appropriate due to intensity of illness or inability to take PO  Dispo: The patient is from: Home              Anticipated d/c is to: Home              Anticipated d/c date is: 2 days              Patient currently is not medically stable to d/c.       Consultants:   Procedures:     Antimicrobials: Anti-infectives (From admission, onward)   None       Subjective: Still feeling sob  Objective: Vitals:   12/09/20 0559 12/09/20 0849 12/09/20 1412 12/09/20 1534  BP: 121/63  117/70   Pulse: 81  70   Resp: 16  18   Temp: (!) 97.5 F (36.4 C)  (!) 97.5 F (36.4 C)   TempSrc: Oral  Oral   SpO2: 97% 95% 94% 94%  Weight:      Height:        Intake/Output Summary (Last 24 hours) at 12/09/2020 1641 Last data filed at 12/09/2020 1412 Gross per 24 hour  Intake 720 ml  Output 550 ml  Net 170 ml   Filed Weights   12/08/20 1317  Weight: 70.2 kg    Examination:  General exam: Appears calm and comfortable  Respiratory system: Clear to auscultation. Respiratory effort normal. Cardiovascular system: S1 & S2 heard, Regular Gastrointestinal system: Abdomen is nondistended, soft and nontender. No organomegaly or masses felt.  Normal bowel sounds heard. Central nervous system: Alert and oriented. No focal neurological deficits. Extremities: Symmetric 5 x 5 power. Skin: No rashes, lesions  Psychiatry: Judgement and insight appear normal. Mood & affect appropriate.   Data Reviewed: I have personally reviewed following labs and imaging studies  CBC: Recent Labs  Lab 12/02/20 1655 12/07/20 2321 12/08/20 1337 12/09/20 0618  WBC 8.3 11.6* 12.5* 12.3*  NEUTROABS 7.2 9.1*  --   --   HGB 12.9* 12.7* 12.0* 11.7*  HCT 42.2 41.5 40.1 38.6*  MCV 80.8 80.9 83.2 81.4  PLT 364 394 338 431   Basic Metabolic Panel: Recent Labs  Lab 12/02/20 1655  12/07/20 2321 12/08/20 1337 12/09/20 0618  NA 138 139 142 140  K 4.3 4.4 3.9 4.1  CL 95* 99 99 99  CO2 30 29 32 29  GLUCOSE 158* 129* 122* 152*  BUN 18 19 15 22   CREATININE 0.83 0.96 0.83 0.81  CALCIUM 9.6 9.2 8.5* 8.4*   GFR: Estimated Creatinine Clearance: 71.1 mL/min (by C-G formula based on SCr of 0.81 mg/dL). Liver Function Tests: No results for input(s): AST, ALT, ALKPHOS, BILITOT, PROT, ALBUMIN in the last 168 hours. No results for input(s): LIPASE, AMYLASE in the last 168 hours. No results for input(s): AMMONIA in the last 168 hours. Coagulation Profile: No results for input(s): INR, PROTIME in the last 168 hours. Cardiac Enzymes: No results for input(s): CKTOTAL, CKMB, CKMBINDEX, TROPONINI in the last 168 hours. BNP (last 3 results) No results for input(s): PROBNP in the last 8760 hours. HbA1C: No results for input(s): HGBA1C in the last 72 hours. CBG: Recent Labs  Lab 12/08/20 1752 12/08/20 2130 12/09/20 0733 12/09/20 1214  GLUCAP 151* 148* 143* 167*   Lipid Profile: No results for input(s): CHOL, HDL, LDLCALC, TRIG, CHOLHDL, LDLDIRECT in the last 72 hours. Thyroid Function Tests: No results for input(s): TSH, T4TOTAL, FREET4, T3FREE, THYROIDAB in the last 72 hours. Anemia Panel: No results for input(s): VITAMINB12, FOLATE, FERRITIN, TIBC, IRON, RETICCTPCT in the last 72 hours. Sepsis Labs: No results for input(s): PROCALCITON, LATICACIDVEN in the last 168 hours.  Recent Results (from the past 240 hour(s))  Resp Panel by RT-PCR (Flu A&B, Covid) Nasopharyngeal Swab     Status: None   Collection Time: 12/02/20  5:00 PM   Specimen: Nasopharyngeal Swab; Nasopharyngeal(NP) swabs in vial transport medium  Result Value Ref Range Status   SARS Coronavirus 2 by RT PCR NEGATIVE NEGATIVE Final    Comment: (NOTE) SARS-CoV-2 target nucleic acids are NOT DETECTED.  The SARS-CoV-2 RNA is generally detectable in upper respiratory specimens during the acute phase of  infection. The lowest concentration of SARS-CoV-2 viral copies this assay can detect is 138 copies/mL. A negative result does not preclude SARS-Cov-2 infection and should not be used as the sole basis for treatment or other patient management decisions. A negative result may occur with  improper specimen collection/handling, submission of specimen other than nasopharyngeal swab, presence of viral mutation(s) within the areas targeted by this assay, and inadequate number of viral copies(<138 copies/mL). A negative result must be combined with clinical observations, patient history, and epidemiological information. The expected result is Negative.  Fact Sheet for Patients:  EntrepreneurPulse.com.au  Fact Sheet for Healthcare Providers:  IncredibleEmployment.be  This test is no t yet approved or cleared by the [place] FDA and  has been authorized for detection and/or diagnosis of SARS-CoV-2 by FDA under an Emergency Use Authorization (EUA). This EUA will remain  in effect (  meaning this test can be used) for the duration of the COVID-19 declaration under Section 564(b)(1) of the Act, 21 U.S.C.section 360bbb-3(b)(1), unless the authorization is terminated  or revoked sooner.       Influenza A by PCR NEGATIVE NEGATIVE Final   Influenza B by PCR NEGATIVE NEGATIVE Final    Comment: (NOTE) The Xpert Xpress SARS-CoV-2/FLU/RSV plus assay is intended as an aid in the diagnosis of influenza from Nasopharyngeal swab specimens and should not be used as a sole basis for treatment. Nasal washings and aspirates are unacceptable for Xpert Xpress SARS-CoV-2/FLU/RSV testing.  Fact Sheet for Patients: EntrepreneurPulse.com.au  Fact Sheet for Healthcare Providers: IncredibleEmployment.be  This test is not yet approved or cleared by the [place] FDA and has been authorized for detection and/or diagnosis of SARS-CoV-2  by FDA under an Emergency Use Authorization (EUA). This EUA will remain in effect (meaning this test can be used) for the duration of the COVID-19 declaration under Section 564(b)(1) of the Act, 21 U.S.C. section 360bbb-3(b)(1), unless the authorization is terminated or revoked.  Performed at [facility], [address] [place], [place] [address]   Resp Panel by RT-PCR (Flu A&B, Covid) Nasopharyngeal Swab     Status: None   Collection Time: 12/07/20 11:59 PM   Specimen: Nasopharyngeal Swab; Nasopharyngeal(NP) swabs in vial transport medium  Result Value Ref Range Status   SARS Coronavirus 2 by RT PCR NEGATIVE NEGATIVE Final    Comment: (NOTE) SARS-CoV-2 target nucleic acids are NOT DETECTED.  The SARS-CoV-2 RNA is generally detectable in upper respiratory specimens during the acute phase of infection. The lowest concentration of SARS-CoV-2 viral copies this assay can detect is 138 copies/mL. A negative result does not preclude SARS-Cov-2 infection and should not be used as the sole basis for treatment or other patient management decisions. A negative result may occur with  improper specimen collection/handling, submission of specimen other than nasopharyngeal swab, presence of viral mutation(s) within the areas targeted by this assay, and inadequate number of viral copies(<138 copies/mL). A negative result must be combined with clinical observations, patient history, and epidemiological information. The expected result is Negative.  Fact Sheet for Patients:  EntrepreneurPulse.com.au  Fact Sheet for Healthcare Providers:  IncredibleEmployment.be  This test is no t yet approved or cleared by the [place] FDA and  has been authorized for detection and/or diagnosis of SARS-CoV-2 by FDA under an Emergency Use Authorization (EUA). This EUA will remain  in effect (meaning this test can be used) for the duration of the COVID-19  declaration under Section 564(b)(1) of the Act, 21 U.S.C.section 360bbb-3(b)(1), unless the authorization is terminated  or revoked sooner.       Influenza A by PCR NEGATIVE NEGATIVE Final   Influenza B by PCR NEGATIVE NEGATIVE Final    Comment: (NOTE) The Xpert Xpress SARS-CoV-2/FLU/RSV plus assay is intended as an aid in the diagnosis of influenza from Nasopharyngeal swab specimens and should not be used as a sole basis for treatment. Nasal washings and aspirates are unacceptable for Xpert Xpress SARS-CoV-2/FLU/RSV testing.  Fact Sheet for Patients: EntrepreneurPulse.com.au  Fact Sheet for Healthcare Providers: IncredibleEmployment.be  This test is not yet approved or cleared by the [place] FDA and has been authorized for detection and/or diagnosis of SARS-CoV-2 by FDA under an Emergency Use Authorization (EUA). This EUA will remain in effect (meaning this test can be used) for the duration of the COVID-19 declaration under Section 564(b)(1) of the Act, 21 U.S.C. section  360bbb-3(b)(1), unless the authorization is terminated or revoked.  Performed at [facility], [address], [place], [place] [address]      Radiology Studies: DG Chest [place] 1 View  Result Date: 12/08/2020 CLINICAL DATA:  Shortness of breath EXAM: PORTABLE CHEST 1 VIEW COMPARISON:  12/02/2020 FINDINGS: There is hyperinflation of the lungs compatible with COPD. Heart and mediastinal contours are within normal limits. No focal opacities or effusions. No acute bony abnormality. Aortic atherosclerosis IMPRESSION: COPD.  No active disease. Electronically Signed   By: Rolm Baptise M.D.   On: 12/08/2020 00:08    Scheduled Meds: . apixaban  5 mg Oral BID  . atorvastatin  40 mg Oral QPC supper  . bisoprolol  5 mg Oral Daily  . calcium carbonate  500 mg Oral Daily  . cholecalciferol  1,000 Units Oral Daily  . diltiazem  180 mg Oral Daily  . furosemide  40  mg Oral BID  . guaiFENesin  600 mg Oral BID  . insulin aspart  0-9 Units Subcutaneous TID WC  . ipratropium-albuterol  3 mL Nebulization Q6H  . loratadine  10 mg Oral Daily  . melatonin  10 mg Oral QHS  . mometasone-formoterol  2 puff Inhalation BID  . pantoprazole  40 mg Oral BID AC  . [START ON 12/10/2020] predniSONE  40 mg Oral Q breakfast  . umeclidinium bromide  1 puff Inhalation Daily   Continuous Infusions: . sodium chloride Stopped (12/08/20 0230)     LOS: 0 days   Marylu Lund, MD [facility] Pager On Amion  If 7PM-7AM, please contact night-coverage 12/09/2020, 4:41 PM

## 2020-12-10 LAB — CBC
HCT: 38.7 % — ABNORMAL LOW (ref 39.0–52.0)
Hemoglobin: 11.9 g/dL — ABNORMAL LOW (ref 13.0–17.0)
MCH: 24.6 pg — ABNORMAL LOW (ref 26.0–34.0)
MCHC: 30.7 g/dL (ref 30.0–36.0)
MCV: 80 fL (ref 80.0–100.0)
Platelets: 314 10*3/uL (ref 150–400)
RBC: 4.84 MIL/uL (ref 4.22–5.81)
RDW: 15.9 % — ABNORMAL HIGH (ref 11.5–15.5)
WBC: 19 10*3/uL — ABNORMAL HIGH (ref 4.0–10.5)
nRBC: 0 % (ref 0.0–0.2)

## 2020-12-10 LAB — BASIC METABOLIC PANEL
Anion gap: 6 (ref 5–15)
BUN: 25 mg/dL — ABNORMAL HIGH (ref 8–23)
CO2: 35 mmol/L — ABNORMAL HIGH (ref 22–32)
Calcium: 8.4 mg/dL — ABNORMAL LOW (ref 8.9–10.3)
Chloride: 98 mmol/L (ref 98–111)
Creatinine, Ser: 0.83 mg/dL (ref 0.61–1.24)
GFR, Estimated: >60 mL/min (ref >60)
Glucose, Bld: 145 mg/dL — ABNORMAL HIGH (ref 70–99)
Potassium: 3.8 mmol/L (ref 3.5–5.1)
Sodium: 139 mmol/L (ref 135–145)

## 2020-12-10 LAB — GLUCOSE, CAPILLARY
Glucose-Capillary: 141 mg/dL — ABNORMAL HIGH (ref 70–99)
Glucose-Capillary: 137 mg/dL — ABNORMAL HIGH (ref 70–99)

## 2020-12-10 MED ORDER — PREDNISONE 10 MG PO TABS: ORAL_TABLET | ORAL | 0 refills | Status: AC

## 2020-12-10 MED ORDER — ALBUTEROL SULFATE (2.5 MG/3ML) 0.083% IN NEBU
2.5000 mg | INHALATION_SOLUTION | Freq: Three times a day (TID) | RESPIRATORY_TRACT | Status: DC
  Administered 2020-12-10: 2.5 mg via RESPIRATORY_TRACT
  Filled 2020-12-10: qty 3

## 2020-12-10 NOTE — Discharge Instructions (Signed)
Continue wearing 2LNC continuously

## 2020-12-10 NOTE — Discharge Summary (Signed)
Physician Discharge Summary  Paul [name] PZW:[id number] DOB: 1946/02/05 DOA: 12/07/2020  PCP: [name], MD  Admit date: 12/07/2020 Discharge date: 12/10/2020  Admitted From: Home Disposition:  Home  Recommendations for Outpatient Follow-up:  1. Follow up with PCP in 1-2 weeks  Equipment/Devices:2LNC continuous home O2    Discharge Condition:Improved CODE STATUS:Full Diet recommendation: Regular   Brief/Interim Summary: 74 year old male with past medical history of atrial fibrillation on anticoagulation, chronic hypoxic respiratory failure secondary to COPD on 2 L O2 nightlyand chronic steroids, hypertension, hyperlipidemia, anxiety who presented to the [facility] ED for worsening shortness of breath which startedseveral days agoand has been worsening since last night. Patient's pulmonologist, Dr. Melvyn Novas, advised prednisone 10 mg twice daily then back to once daily when feeling better and added a Z-Pak on 12/10however the patient has not started this regimen just yet. States that he frequently gets COPD exacerbations in the winter time. Denies any fever, sick contacts, chest pain.   Discharge Diagnoses:  Principal Problem:   COPD with acute exacerbation ([name]) Active Problems:   Paroxysmal atrial fibrillation (HCC)   HLD (hyperlipidemia)   Essential hypertension   Chronic diastolic CHF (congestive heart failure) (HCC)   COPD exacerbation ([place])   1. COPD exacerbation, likely viral induced a. COVID-19 negative b. Continue with Dulera, Incruse Ellipta add standing duo nebs, as needed albuterol and steroids c. Tolerates room air at rest at baseline and on 2 LPM nightly. Titrate O2 as appropriate d. Reports hyperglycemia with steroids in the past-sensitive sliding scale e. At rest is on RA however on trial of ambulation, pt did desaturate to the mid to low 80's, requiring continued O2 f. Clinically improved. The following day, on second attempt at ambulation, pt noted to  desaturate on RA but maintained O2 sat on [facility]. Recommend continuous home O2 at [facility] g. Have prescribed prednisone taper on d/c  2. Debility a. PT eval requested with no PT needs identified  3. Atrial fibrillation, rate controlled a. Continue bisoprolol, Cardizem and Eliquis as tolerated  4. Hypertension a. Cont current home meds as tolerated  5. Hyperlipidemia a. Continue statin  Discharge Instructions   Allergies as of 12/10/2020      Reactions   Daliresp [roflumilast]    Nausea, vomiting and diarrhea.   Alendronate Other (See Comments)   Jittery/nervous      Medication List    TAKE these medications   apixaban 5 MG Tabs tablet Commonly known as: ELIQUIS Take 1 tablet (5 mg total) by mouth 2 (two) times daily.   atorvastatin 40 MG tablet Commonly known as: LIPITOR TAKE 1 TABLET(40 MG) BY MOUTH DAILY AFTER SUPPER What changed:   how much to take  how to take this  when to take this  additional instructions   azithromycin 250 MG tablet Commonly known as: ZITHROMAX Take 2 tablets (500mg  total) today,then 1 tablet (250mg ) x4 days What changed:   how much to take  how to take this  when to take this  additional instructions   bisoprolol 5 MG tablet Commonly known as: ZEBETA Take 1 tablet (5 mg total) by mouth daily.   budesonide-formoterol 160-4.5 MCG/ACT inhaler Commonly known as: SYMBICORT Inhale 2 puffs into the lungs 2 (two) times daily.   calcium carbonate 600 MG Tabs tablet Commonly known as: OS-CAL Take 600 mg by mouth daily.   cholecalciferol 25 MCG (1000 UNIT) tablet Commonly known as: VITAMIN D3 Take 1,000 Units by mouth daily.   denosumab 60 MG/ML Sosy injection Commonly known  as: PROLIA Inject 60 mg into the skin every 6 (six) months.   diltiazem 180 MG 24 hr capsule Commonly known as: CARDIZEM CD Take 1 capsule (180 mg total) by mouth daily.   furosemide 40 MG tablet Commonly known as: LASIX Take 1 tablet (40 mg  total) by mouth 2 (two) times daily.   guaiFENesin 600 MG 12 hr tablet Commonly known as: MUCINEX Take 600 mg by mouth 2 (two) times daily.   loratadine 10 MG tablet Commonly known as: CLARITIN Take 10 mg by mouth daily.   melatonin 5 MG Tabs Take 10 mg by mouth at bedtime as needed (sleep).   omeprazole 20 MG capsule Commonly known as: PRILOSEC Take 30- 60 min before your first and last meals of the day What changed:   how much to take  how to take this  when to take this  reasons to take this  additional instructions   predniSONE 10 MG tablet Commonly known as: DELTASONE Take 4 tablets (40 mg total) by mouth daily for 2 days, THEN 2 tablets (20 mg total) daily for 2 days, THEN 1 tablet (10 mg total) daily for 2 days, THEN 0.5 tablets (5 mg total) daily for 2 days. Start taking on: December 10, 2020 What changed: See the new instructions.   ProAir HFA 108 (90 Base) MCG/ACT inhaler Generic drug: albuterol Inhale 2 puffs into the lungs every 6 (six) hours as needed for wheezing or shortness of breath.   albuterol (2.5 MG/3ML) 0.083% nebulizer solution Commonly known as: PROVENTIL Take 3 mLs (2.5 mg total) by nebulization in the morning and at bedtime.   Spiriva Respimat 2.5 MCG/ACT Aers Generic drug: Tiotropium Bromide Monohydrate Inhale 2 puffs into the lungs daily.            Durable Medical Equipment  (From admission, onward)         Start     Ordered   12/10/20 1323  For home use only DME oxygen  Once       Question Answer Comment  Length of Need Lifetime   Mode or (Route) Nasal cannula   Liters per Minute 2   Frequency Continuous (stationary and portable oxygen unit needed)   Oxygen delivery system Gas      12/10/20 1322          Follow-up Information    [facility], Elnita Maxwell, MD.   Specialties: Family Medicine, Interventional Cardiology, Radiology, Anesthesiology Contact information: [address] [place]  [address] [phone]              Allergies  Allergen Reactions  . Daliresp [Roflumilast]     Nausea, vomiting and diarrhea.  . Alendronate Other (See Comments)    Jittery/nervous   Procedures/Studies: DG Chest Port 1 View  Result Date: 12/08/2020 CLINICAL DATA:  Shortness of breath EXAM: PORTABLE CHEST 1 VIEW COMPARISON:  12/02/2020 FINDINGS: There is hyperinflation of the lungs compatible with COPD. Heart and mediastinal contours are within normal limits. No focal opacities or effusions. No acute bony abnormality. Aortic atherosclerosis IMPRESSION: COPD.  No active disease. Electronically Signed   By: Rolm Baptise M.D.   On: 12/08/2020 00:08   DG Chest Portable 1 View  Result Date: 12/02/2020 CLINICAL DATA:  Increased shortness of breath, history of COPD. EXAM: PORTABLE CHEST 1 VIEW COMPARISON:  Chest x-ray 04/01/2020 FINDINGS: The heart size and mediastinal contours are within normal limits. Aortic arch calcification. Flattening of the hemidiaphragms with hyperinflation of the lungs  consistent with emphysematous changes. No focal consolidation. No pulmonary edema. No pleural effusion. No pneumothorax. No acute osseous abnormality. IMPRESSION: No active disease. Electronically Signed   By: Iven Finn M.D.   On: 12/02/2020 17:04    Subjective: Eager to go home  Discharge Exam: Vitals:   12/10/20 0541 12/10/20 0919  BP: 123/74   Pulse: 81 81  Resp: 16 16  Temp: (!) 97.5 F (36.4 C)   SpO2: 98% 98%   Vitals:   12/09/20 2029 12/10/20 0107 12/10/20 0541 12/10/20 0919  BP: 124/60  123/74   Pulse: 74  81 81  Resp: 16  16 16   Temp: 98 F (36.7 C)  (!) 97.5 F (36.4 C)   TempSrc: Oral  Oral   SpO2: 98% 98% 98% 98%  Weight:      Height:        General: Pt is alert, awake, not in acute distress Cardiovascular: RRR, S1/S2 +, no rubs, no gallops Respiratory: CTA bilaterally, no wheezing, no rhonchi Abdominal: Soft, NT, ND, bowel sounds + Extremities: no edema, no  cyanosis   The results of significant diagnostics from this hospitalization (including imaging, microbiology, ancillary and laboratory) are listed below for reference.     Microbiology: Recent Results (from the past 240 hour(s))  Resp Panel by RT-PCR (Flu A&B, Covid) Nasopharyngeal Swab     Status: None   Collection Time: 12/02/20  5:00 PM   Specimen: Nasopharyngeal Swab; Nasopharyngeal(NP) swabs in vial transport medium  Result Value Ref Range Status   SARS Coronavirus 2 by RT PCR NEGATIVE NEGATIVE Final    Comment: (NOTE) SARS-CoV-2 target nucleic acids are NOT DETECTED.  The SARS-CoV-2 RNA is generally detectable in upper respiratory specimens during the acute phase of infection. The lowest concentration of SARS-CoV-2 viral copies this assay can detect is 138 copies/mL. A negative result does not preclude SARS-Cov-2 infection and should not be used as the sole basis for treatment or other patient management decisions. A negative result may occur with  improper specimen collection/handling, submission of specimen other than nasopharyngeal swab, presence of viral mutation(s) within the areas targeted by this assay, and inadequate number of viral copies(<138 copies/mL). A negative result must be combined with clinical observations, patient history, and epidemiological information. The expected result is Negative.  Fact Sheet for Patients:  EntrepreneurPulse.com.au  Fact Sheet for Healthcare Providers:  IncredibleEmployment.be  This test is no t yet approved or cleared by the [place] FDA and  has been authorized for detection and/or diagnosis of SARS-CoV-2 by FDA under an Emergency Use Authorization (EUA). This EUA will remain  in effect (meaning this test can be used) for the duration of the COVID-19 declaration under Section 564(b)(1) of the Act, 21 U.S.C.section 360bbb-3(b)(1), unless the authorization is terminated  or revoked  sooner.       Influenza A by PCR NEGATIVE NEGATIVE Final   Influenza B by PCR NEGATIVE NEGATIVE Final    Comment: (NOTE) The Xpert Xpress SARS-CoV-2/FLU/RSV plus assay is intended as an aid in the diagnosis of influenza from Nasopharyngeal swab specimens and should not be used as a sole basis for treatment. Nasal washings and aspirates are unacceptable for Xpert Xpress SARS-CoV-2/FLU/RSV testing.  Fact Sheet for Patients: EntrepreneurPulse.com.au  Fact Sheet for Healthcare Providers: IncredibleEmployment.be  This test is not yet approved or cleared by the [place] FDA and has been authorized for detection and/or diagnosis of SARS-CoV-2 by FDA under an Emergency Use Authorization (EUA). This EUA will remain in  effect (meaning this test can be used) for the duration of the COVID-19 declaration under Section 564(b)(1) of the Act, 21 U.S.C. section 360bbb-3(b)(1), unless the authorization is terminated or revoked.  Performed at [facility], [address] [place], [place] [address]   Resp Panel by RT-PCR (Flu A&B, Covid) Nasopharyngeal Swab     Status: None   Collection Time: 12/07/20 11:59 PM   Specimen: Nasopharyngeal Swab; Nasopharyngeal(NP) swabs in vial transport medium  Result Value Ref Range Status   SARS Coronavirus 2 by RT PCR NEGATIVE NEGATIVE Final    Comment: (NOTE) SARS-CoV-2 target nucleic acids are NOT DETECTED.  The SARS-CoV-2 RNA is generally detectable in upper respiratory specimens during the acute phase of infection. The lowest concentration of SARS-CoV-2 viral copies this assay can detect is 138 copies/mL. A negative result does not preclude SARS-Cov-2 infection and should not be used as the sole basis for treatment or other patient management decisions. A negative result may occur with  improper specimen collection/handling, submission of specimen other than nasopharyngeal swab, presence of viral  mutation(s) within the areas targeted by this assay, and inadequate number of viral copies(<138 copies/mL). A negative result must be combined with clinical observations, patient history, and epidemiological information. The expected result is Negative.  Fact Sheet for Patients:  EntrepreneurPulse.com.au  Fact Sheet for Healthcare Providers:  IncredibleEmployment.be  This test is no t yet approved or cleared by the [place] FDA and  has been authorized for detection and/or diagnosis of SARS-CoV-2 by FDA under an Emergency Use Authorization (EUA). This EUA will remain  in effect (meaning this test can be used) for the duration of the COVID-19 declaration under Section 564(b)(1) of the Act, 21 U.S.C.section 360bbb-3(b)(1), unless the authorization is terminated  or revoked sooner.       Influenza A by PCR NEGATIVE NEGATIVE Final   Influenza B by PCR NEGATIVE NEGATIVE Final    Comment: (NOTE) The Xpert Xpress SARS-CoV-2/FLU/RSV plus assay is intended as an aid in the diagnosis of influenza from Nasopharyngeal swab specimens and should not be used as a sole basis for treatment. Nasal washings and aspirates are unacceptable for Xpert Xpress SARS-CoV-2/FLU/RSV testing.  Fact Sheet for Patients: EntrepreneurPulse.com.au  Fact Sheet for Healthcare Providers: IncredibleEmployment.be  This test is not yet approved or cleared by the [place] FDA and has been authorized for detection and/or diagnosis of SARS-CoV-2 by FDA under an Emergency Use Authorization (EUA). This EUA will remain in effect (meaning this test can be used) for the duration of the COVID-19 declaration under Section 564(b)(1) of the Act, 21 U.S.C. section 360bbb-3(b)(1), unless the authorization is terminated or revoked.  Performed at [facility], [address] [place], [place] [address]      Labs: BNP (last 3  results) Recent Labs    02/02/20 1200 02/16/20 1726 03/04/20 1220  BNP 87.4 88.9 26.9   Basic Metabolic Panel: Recent Labs  Lab 12/07/20 2321 12/08/20 1337 12/09/20 0618 12/10/20 0610  NA 139 142 140 139  K 4.4 3.9 4.1 3.8  CL 99 99 99 98  CO2 29 32 29 35*  GLUCOSE 129* 122* 152* 145*  BUN 19 15 22  25*  CREATININE 0.96 0.83 0.81 0.83  CALCIUM 9.2 8.5* 8.4* 8.4*   Liver Function Tests: No results for input(s): AST, ALT, ALKPHOS, BILITOT, PROT, ALBUMIN in the last 168 hours. No results for input(s): LIPASE, AMYLASE in the last 168 hours. No results for input(s): AMMONIA  in the last 168 hours. CBC: Recent Labs  Lab 12/07/20 2321 12/08/20 1337 12/09/20 0618 12/10/20 0610  WBC 11.6* 12.5* 12.3* 19.0*  NEUTROABS 9.1*  --   --   --   HGB 12.7* 12.0* 11.7* 11.9*  HCT 41.5 40.1 38.6* 38.7*  MCV 80.9 83.2 81.4 80.0  PLT 394 338 307 314   Cardiac Enzymes: No results for input(s): CKTOTAL, CKMB, CKMBINDEX, TROPONINI in the last 168 hours. BNP: Invalid input(s): POCBNP CBG: Recent Labs  Lab 12/09/20 1214 12/09/20 1649 12/09/20 2027 12/10/20 0749 12/10/20 1209  GLUCAP 167* 143* 159* 141* 137*   D-Dimer No results for input(s): DDIMER in the last 72 hours. Hgb A1c No results for input(s): HGBA1C in the last 72 hours. Lipid Profile No results for input(s): CHOL, HDL, LDLCALC, TRIG, CHOLHDL, LDLDIRECT in the last 72 hours. Thyroid function studies No results for input(s): TSH, T4TOTAL, T3FREE, THYROIDAB in the last 72 hours.  Invalid input(s): FREET3 Anemia work up No results for input(s): VITAMINB12, FOLATE, FERRITIN, TIBC, IRON, RETICCTPCT in the last 72 hours. Urinalysis    Component Value Date/Time   COLORURINE STRAW (A) 04/01/2020 1325   APPEARANCEUR CLEAR 04/01/2020 1325   LABSPEC 1.006 04/01/2020 1325   PHURINE 8.0 04/01/2020 1325   GLUCOSEU NEGATIVE 04/01/2020 1325   HGBUR NEGATIVE 04/01/2020 1325   BILIRUBINUR NEGATIVE 04/01/2020 1325   KETONESUR  NEGATIVE 04/01/2020 1325   PROTEINUR NEGATIVE 04/01/2020 1325   NITRITE NEGATIVE 04/01/2020 1325   LEUKOCYTESUR NEGATIVE 04/01/2020 1325   Sepsis Labs Invalid input(s): PROCALCITONIN,  WBC,  LACTICIDVEN Microbiology Recent Results (from the past 240 hour(s))  Resp Panel by RT-PCR (Flu A&B, Covid) Nasopharyngeal Swab     Status: None   Collection Time: 12/02/20  5:00 PM   Specimen: Nasopharyngeal Swab; Nasopharyngeal(NP) swabs in vial transport medium  Result Value Ref Range Status   SARS Coronavirus 2 by RT PCR NEGATIVE NEGATIVE Final    Comment: (NOTE) SARS-CoV-2 target nucleic acids are NOT DETECTED.  The SARS-CoV-2 RNA is generally detectable in upper respiratory specimens during the acute phase of infection. The lowest concentration of SARS-CoV-2 viral copies this assay can detect is 138 copies/mL. A negative result does not preclude SARS-Cov-2 infection and should not be used as the sole basis for treatment or other patient management decisions. A negative result may occur with  improper specimen collection/handling, submission of specimen other than nasopharyngeal swab, presence of viral mutation(s) within the areas targeted by this assay, and inadequate number of viral copies(<138 copies/mL). A negative result must be combined with clinical observations, patient history, and epidemiological information. The expected result is Negative.  Fact Sheet for Patients:  EntrepreneurPulse.com.au  Fact Sheet for Healthcare Providers:  IncredibleEmployment.be  This test is no t yet approved or cleared by the [place] FDA and  has been authorized for detection and/or diagnosis of SARS-CoV-2 by FDA under an Emergency Use Authorization (EUA). This EUA will remain  in effect (meaning this test can be used) for the duration of the COVID-19 declaration under Section 564(b)(1) of the Act, 21 U.S.C.section 360bbb-3(b)(1), unless the authorization  is terminated  or revoked sooner.       Influenza A by PCR NEGATIVE NEGATIVE Final   Influenza B by PCR NEGATIVE NEGATIVE Final    Comment: (NOTE) The Xpert Xpress SARS-CoV-2/FLU/RSV plus assay is intended as an aid in the diagnosis of influenza from Nasopharyngeal swab specimens and should not be used as a sole basis for treatment. Nasal washings and aspirates  are unacceptable for Xpert Xpress SARS-CoV-2/FLU/RSV testing.  Fact Sheet for Patients: EntrepreneurPulse.com.au  Fact Sheet for Healthcare Providers: IncredibleEmployment.be  This test is not yet approved or cleared by the [place] FDA and has been authorized for detection and/or diagnosis of SARS-CoV-2 by FDA under an Emergency Use Authorization (EUA). This EUA will remain in effect (meaning this test can be used) for the duration of the COVID-19 declaration under Section 564(b)(1) of the Act, 21 U.S.C. section 360bbb-3(b)(1), unless the authorization is terminated or revoked.  Performed at [facility], [address] [place], [place] [address]   Resp Panel by RT-PCR (Flu A&B, Covid) Nasopharyngeal Swab     Status: None   Collection Time: 12/07/20 11:59 PM   Specimen: Nasopharyngeal Swab; Nasopharyngeal(NP) swabs in vial transport medium  Result Value Ref Range Status   SARS Coronavirus 2 by RT PCR NEGATIVE NEGATIVE Final    Comment: (NOTE) SARS-CoV-2 target nucleic acids are NOT DETECTED.  The SARS-CoV-2 RNA is generally detectable in upper respiratory specimens during the acute phase of infection. The lowest concentration of SARS-CoV-2 viral copies this assay can detect is 138 copies/mL. A negative result does not preclude SARS-Cov-2 infection and should not be used as the sole basis for treatment or other patient management decisions. A negative result may occur with  improper specimen collection/handling, submission of specimen other than nasopharyngeal  swab, presence of viral mutation(s) within the areas targeted by this assay, and inadequate number of viral copies(<138 copies/mL). A negative result must be combined with clinical observations, patient history, and epidemiological information. The expected result is Negative.  Fact Sheet for Patients:  EntrepreneurPulse.com.au  Fact Sheet for Healthcare Providers:  IncredibleEmployment.be  This test is no t yet approved or cleared by the [place] FDA and  has been authorized for detection and/or diagnosis of SARS-CoV-2 by FDA under an Emergency Use Authorization (EUA). This EUA will remain  in effect (meaning this test can be used) for the duration of the COVID-19 declaration under Section 564(b)(1) of the Act, 21 U.S.C.section 360bbb-3(b)(1), unless the authorization is terminated  or revoked sooner.       Influenza A by PCR NEGATIVE NEGATIVE Final   Influenza B by PCR NEGATIVE NEGATIVE Final    Comment: (NOTE) The Xpert Xpress SARS-CoV-2/FLU/RSV plus assay is intended as an aid in the diagnosis of influenza from Nasopharyngeal swab specimens and should not be used as a sole basis for treatment. Nasal washings and aspirates are unacceptable for Xpert Xpress SARS-CoV-2/FLU/RSV testing.  Fact Sheet for Patients: EntrepreneurPulse.com.au  Fact Sheet for Healthcare Providers: IncredibleEmployment.be  This test is not yet approved or cleared by the [place] FDA and has been authorized for detection and/or diagnosis of SARS-CoV-2 by FDA under an Emergency Use Authorization (EUA). This EUA will remain in effect (meaning this test can be used) for the duration of the COVID-19 declaration under Section 564(b)(1) of the Act, 21 U.S.C. section 360bbb-3(b)(1), unless the authorization is terminated or revoked.  Performed at [facility], [address] [place], [place] [address]    Time  spent: 30 min  SIGNED:   Marylu Lund, MD  [facility] 12/10/2020, 5:48 PM  If 7PM-7AM, please contact night-coverage

## 2020-12-13 ENCOUNTER — Ambulatory Visit (INDEPENDENT_AMBULATORY_CARE_PROVIDER_SITE_OTHER): Payer: Medicare Other | Admitting: Nurse Practitioner

## 2020-12-13 ENCOUNTER — Encounter: Payer: Self-pay | Admitting: Nurse Practitioner

## 2020-12-13 VITALS — BP 144/62 | HR 88 | Temp 97.2°F | Ht 66.0 in | Wt 159.0 lb

## 2020-12-13 DIAGNOSIS — I482 Chronic atrial fibrillation, unspecified: Secondary | ICD-10-CM | POA: Diagnosis not present

## 2020-12-13 DIAGNOSIS — J9611 Chronic respiratory failure with hypoxia: Secondary | ICD-10-CM

## 2020-12-13 DIAGNOSIS — I700 Atherosclerosis of aorta: Secondary | ICD-10-CM | POA: Diagnosis not present

## 2020-12-13 DIAGNOSIS — J441 Chronic obstructive pulmonary disease with (acute) exacerbation: Secondary | ICD-10-CM

## 2020-12-13 DIAGNOSIS — R54 Age-related physical debility: Secondary | ICD-10-CM

## 2020-12-13 DIAGNOSIS — Z9981 Dependence on supplemental oxygen: Secondary | ICD-10-CM | POA: Diagnosis not present

## 2020-12-13 DIAGNOSIS — Z7901 Long term (current) use of anticoagulants: Secondary | ICD-10-CM | POA: Diagnosis not present

## 2020-12-13 NOTE — Progress Notes (Signed)
Subjective:  Patient ID: Paul [name], male    DOB: 01/01/1946  Age: 74 y.o. MRN: [id number]  Chief Complaint  Patient presents with  . Hospitalization Follow-up    HPI  Paul [name] is a 74 year old Caucasian male that presents for hospital follow-up. Pt was hospitalized 12/07/2020-12/10/2020 at [facility] for COPD exacerbation. He was treated with  nebulizer treatments and IV steroids. Home O2 has been increased to 2L/min via [place] continuous rather than at night. He tested negative for COVID-19 and flu. CXR noted aortic atherosclerosis. He is currently on atorvastatin 40 mg therapy. Other medical history includes a-fib with chronic anticoagulation therapy, steroid induced osteoporosis, hypertension, hyperlipidemia, and chronic anemia.   COPD Paul [name] has a history of COPD for several years. He tells me he has frequent exacerbations during the winter. He avoids going outdoors as much as possible during winter months to decrease exacerbations. Current treatment includes Dulera, Symbicort, Spiriva, and Albuterol inhalers. He is now on continuous O2 at 3L/min via with physical activity and at night. He has a personal pulse oximeter that he uses regularly to monitor oxygen saturation. States he applies O2 [place] when oxygen saturation decreases below 89% with activity. He has a flutter device he uses often to prevent atelectasis. He is currently completing a course of Azithromycin and Prednisone that were prescribed at hospital d/c. He has home health palliative care nurses that see him on a regularly scheduled basis. He is followed by Dr Melvyn Novas, pulmonologist, next appt scheduled is 01/03/2021. He is adherent to treatment and follow-up appointments.    Chronic A-fib with long-term anticoagulation Paul [name] has a history of paroxismal a-fib for several years. Current treatment includes Cardizem 180 mg daily and Eliquis 5 mg twice daily. He denies chest pain, syncope, dizziness, or light-headedness. Pulse 88 BPM today  in office. He is followed by Dr Marlou Porch, caridiologist. He is scheduled to see him 01/24/2021. He is adherent to medications and follow-up appointments.  Steroid induced osteoporosis Paul [name] has a history of steroid induced osteoporosis for several years. He has suffered spinal compression fractures in the past. Current treatment is Prolia 60 mg twice yearly. Paul [name] is currently completing a steroid taper from hospitalization of COPD exacerbation. Next Prolia injection scheduled March 2022. He denies experiencing recent fragility fractures or pain related to osteoporosis. He is adherent to medication regimen and follow-up appointments.   Hypertension Paul [name] has a history of hypertension for several years. Current treatment includes Diltiazem 180 mg daily and Lasix 40 mg BID. He tries to consume a low-salt heart healthy diet. He is limited with physical activity due to advanced COPD. BP is moderately controlled but not at goal. BP today 144/62.        He has received flu and COVID-19 immunizations as well booster.  Current Outpatient Medications on File Prior to Visit  Medication Sig Dispense Refill  . albuterol (PROAIR HFA) 108 (90 Base) MCG/ACT inhaler Inhale 2 puffs into the lungs every 6 (six) hours as needed for wheezing or shortness of breath.     Marland Kitchen albuterol (PROVENTIL) (2.5 MG/3ML) 0.083% nebulizer solution Take 3 mLs (2.5 mg total) by nebulization in the morning and at bedtime. 75 mL 6  . apixaban (ELIQUIS) 5 MG TABS tablet Take 1 tablet (5 mg total) by mouth 2 (two) times daily. 180 tablet 3  . atorvastatin (LIPITOR) 40 MG tablet TAKE 1 TABLET(40 MG) BY MOUTH DAILY AFTER SUPPER (Patient taking differently: Take 40 mg by mouth daily.) 90 tablet 3  . bisoprolol (ZEBETA)  5 MG tablet Take 1 tablet (5 mg total) by mouth daily. 90 tablet 0  . budesonide-formoterol (SYMBICORT) 160-4.5 MCG/ACT inhaler Inhale 2 puffs into the lungs 2 (two) times daily. 1 Inhaler 3  . calcium carbonate (OS-CAL) 600 MG  TABS tablet Take 600 mg by mouth daily.    . cholecalciferol (VITAMIN D3) 25 MCG (1000 UNIT) tablet Take 1,000 Units by mouth daily.    Marland Kitchen denosumab (PROLIA) 60 MG/ML SOSY injection Inject 60 mg into the skin every 6 (six) months.    . diltiazem (CARDIZEM CD) 180 MG 24 hr capsule Take 1 capsule (180 mg total) by mouth daily. 90 capsule 3  . furosemide (LASIX) 40 MG tablet Take 1 tablet (40 mg total) by mouth 2 (two) times daily. 180 tablet 3  . guaiFENesin (MUCINEX) 600 MG 12 hr tablet Take 600 mg by mouth 2 (two) times daily.    Marland Kitchen loratadine (CLARITIN) 10 MG tablet Take 10 mg by mouth daily.    . Melatonin 5 MG TABS Take 10 mg by mouth at bedtime as needed (sleep).    Marland Kitchen omeprazole (PRILOSEC) 20 MG capsule Take 30- 60 min before your first and last meals of the day (Patient taking differently: Take 20 mg by mouth daily as needed (acid reflux).)    . predniSONE (DELTASONE) 10 MG tablet Take 4 tablets (40 mg total) by mouth daily for 2 days, THEN 2 tablets (20 mg total) daily for 2 days, THEN 1 tablet (10 mg total) daily for 2 days, THEN 0.5 tablets (5 mg total) daily for 2 days. 15 tablet 0  . Tiotropium Bromide Monohydrate (SPIRIVA RESPIMAT) 2.5 MCG/ACT AERS Inhale 2 puffs into the lungs daily. 4 g 0   No current facility-administered medications on file prior to visit.   Past Medical History:  Diagnosis Date  . A-fib ([place])   . Anemia   . Atrial fibrillation ([place])   . Back pain   . COPD (chronic obstructive pulmonary disease) ([place])   . Diabetes mellitus without complication ([place])   . Elevated PSA   . GAD (generalized anxiety disorder)   . High cholesterol   . Hypertension   . Osteoporosis    Past Surgical History:  Procedure Laterality Date  . CATARACT EXTRACTION Right   . SKIN SURGERY     shoulders and chest    Family History  Problem Relation Age of Onset  . Heart disease Brother   . Heart disease Mother   . Heart disease Father   . Cancer Paternal Uncle    Social History    Socioeconomic History  . Marital status: Married    Spouse name: Not on file  . Number of children: 3  . Years of education: Not on file  . Highest education level: Not on file  Occupational History  . Occupation: retired    Comment: truck Geophysicist/field seismologist  Tobacco Use  . Smoking status: Former Smoker    Packs/day: 2.00    Years: 52.00    Pack years: 104.00    Types: Cigarettes    Quit date: 02/03/2009    Years since quitting: 11.8  . Smokeless tobacco: Never Used  . Tobacco comment: Counseled to remain smoke free  Vaping Use  . Vaping Use: Never used  Substance and Sexual Activity  . Alcohol use: Yes    Alcohol/week: 0.0 standard drinks    Comment: occ  . Drug use: No  . Sexual activity: Not on file  Other Topics Concern  .  Not on file  Social History Narrative   Originally from [place]. Previously worked driving a Restaurant manager, fast food. No pets currently. No mold exposure. 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       wears sunscreen, brushes and flosses daily, see's dentist bi-annually, has smoke/carbon monoxide detectors, wears a seatbelt and practices gun safety      Social Determinants of Health   Financial Resource Strain: Not on file  Food Insecurity: Not on file  Transportation Needs: Not on file  Physical Activity: Inactive  . Days of Exercise per Week: 0 days  . Minutes of Exercise per Session: 0 min  Stress: Not on file  Social Connections: Not on file    Review of Systems  Constitutional: Negative for fatigue and fever.  HENT: Negative for congestion, ear pain, sinus pressure and sore throat.   Eyes: Negative for pain.  Respiratory: Positive for cough and shortness of breath. Negative for chest tightness and wheezing.   Cardiovascular: Negative for chest pain and palpitations.  Gastrointestinal: Negative for abdominal pain, constipation, diarrhea, nausea and vomiting.  Genitourinary: Negative for dysuria  and hematuria.  Musculoskeletal: Negative for arthralgias, back pain, joint swelling and myalgias.  Skin: Negative for rash.  Neurological: Negative for dizziness, weakness and headaches.  Psychiatric/Behavioral: Negative for dysphoric mood. The patient is not nervous/anxious.      Objective:  There were no vitals taken for this visit.  BP/Weight 12/10/2020 12/08/2020 [id number]  Systolic BP [id number] - [id number]  Diastolic BP 74 - 81  Wt. (Lbs) - 154.8 156  BMI - 24.99 25.18    Physical Exam Vitals reviewed.  Constitutional:      Appearance: He is ill-appearing.  HENT:     Head: Normocephalic.     Right Ear: Tympanic membrane and external ear normal.     Left Ear: Tympanic membrane and external ear normal.     Nose: Nose normal.     Mouth/Throat:     Mouth: Mucous membranes are moist.  Cardiovascular:     Rate and Rhythm: Normal rate. Rhythm irregular.     Pulses: Normal pulses.     Heart sounds: Normal heart sounds.  Pulmonary:     Effort: Pulmonary effort is normal.     Comments: Lung sounds diminished in all fields Abdominal:     General: Abdomen is flat. Bowel sounds are normal.     Palpations: Abdomen is soft.  Musculoskeletal:     Cervical back: Normal range of motion.     Comments: Decreased ROM due to  Spinal compression fractures and deconditioning  Skin:    General: Skin is warm and dry.     Capillary Refill: Capillary refill takes less than 2 seconds.  Neurological:     Mental Status: He is alert and oriented to person, place, and time. Mental status is at baseline.  Psychiatric:        Mood and Affect: Mood normal.        Behavior: Behavior normal.           Lab Results  Component Value Date   WBC 19.0 (H) 12/10/2020   HGB 11.9 (L) 12/10/2020   HCT 38.7 (L) 12/10/2020   PLT 314 12/10/2020   GLUCOSE 145 (H) 12/10/2020   CHOL 172 11/20/2020   TRIG 123 11/20/2020   HDL 60 11/20/2020   LDLCALC 90 11/20/2020   ALT 27 11/20/2020   AST 18 11/20/2020  NA 139 12/10/2020   K 3.8 12/10/2020   CL 98 12/10/2020   CREATININE 0.83 12/10/2020   BUN 25 (H) 12/10/2020   CO2 35 (H) 12/10/2020   TSH 2.539 03/22/2019   INR 1.1 04/01/2020   HGBA1C 6.3 (H) 11/20/2020      Assessment & Plan:  1. COPD exacerbation (HCC) -Continue O2 at 2L/min via [place] during physical activity, titrate as needed and QHS -Continue Dulera, Ellipta, Symbicort, and Albuterol -Notify office or seek emergency care for severe dyspnea  2. Aortic atherosclerosis (HCC) -Continue Atorvastatin 40 mg daily -Continue heart healthy diet   3. Chronic atrial fibrillation (HCC) -Continue Cardizem, Bisoprolol, and Eliquis as prescribed  4. Current use of long term anticoagulation -Fall precautions, seek emergency medical assistance for any falls or uncontrolled bleeding -Continue Eliquis as prescribed  5. Age-related physical debility -Continue home health palliative care for assistance with ADLs  6. Chronic respiratory failure with hypoxia (HCC)  -Continue prescribed respiratory inhalers -Continue O2 as directed -Continue follow-up with pulmonologist  7. Oxygen dependent -Continue O2 as directed, titrate as needed  I spent minutes 60 dedicated to the care of this patient on the date of this encounter to include face-to-face time with the patient, as well as: review of EMR  Continue current medications Seek emergency care for severe shortness of breath or falls Keep follow-up appointments as scheduled     Follow-up: Return in about 3 months (around 03/13/2021) for fasting with Dr Tobie Poet.  An After Visit Summary was printed and given to the patient.  Rip Harbour, NP [place] [phone]

## 2020-12-13 NOTE — Patient Instructions (Addendum)
Continue current medications Seek emergency care for severe shortness of breath or falls Keep follow-up appointments as scheduled     Fall Prevention in the Home, Adult Falls can cause injuries. They can happen to people of all ages. There are many things you can do to make your home safe and to help prevent falls. Ask for help when making these changes, if needed. What actions can I take to prevent falls? General Instructions  Use good lighting in all rooms. Replace any light bulbs that burn out.  Turn on the lights when you go into a dark area. Use night-lights.  Keep items that you use often in easy-to-reach places. Lower the shelves around your home if necessary.  Set up your furniture so you have a clear path. Avoid moving your furniture around.  Do not have throw rugs and other things on the floor that can make you trip.  Avoid walking on wet floors.  If any of your floors are uneven, fix them.  Add color or contrast paint or tape to clearly mark and help you see: ? Any grab bars or handrails. ? First and last steps of stairways. ? Where the edge of each step is.  If you use a stepladder: ? Make sure that it is fully opened. Do not climb a closed stepladder. ? Make sure that both sides of the stepladder are locked into place. ? Ask someone to hold the stepladder for you while you use it.  If there are any pets around you, be aware of where they are. What can I do in the bathroom?      Keep the floor dry. Clean up any water that spills onto the floor as soon as it happens.  Remove soap buildup in the tub or shower regularly.  Use non-skid mats or decals on the floor of the tub or shower.  Attach bath mats securely with double-sided, non-slip rug tape.  If you need to sit down in the shower, use a plastic, non-slip stool.  Install grab bars by the toilet and in the tub and shower. Do not use towel bars as grab bars. What can I do in the bedroom?  Make sure  that you have a light by your bed that is easy to reach.  Do not use any sheets or blankets that are too big for your bed. They should not hang down onto the floor.  Have a firm chair that has side arms. You can use this for support while you get dressed. What can I do in the kitchen?  Clean up any spills right away.  If you need to reach something above you, use a strong step stool that has a grab bar.  Keep electrical cords out of the way.  Do not use floor polish or wax that makes floors slippery. If you must use wax, use non-skid floor wax. What can I do with my stairs?  Do not leave any items on the stairs.  Make sure that you have a light switch at the top of the stairs and the bottom of the stairs. If you do not have them, ask someone to add them for you.  Make sure that there are handrails on both sides of the stairs, and use them. Fix handrails that are broken or loose. Make sure that handrails are as long as the stairways.  Install non-slip stair treads on all stairs in your home.  Avoid having throw rugs at the top or bottom of  the stairs. If you do have throw rugs, attach them to the floor with carpet tape.  Choose a carpet that does not hide the edge of the steps on the stairway.  Check any carpeting to make sure that it is firmly attached to the stairs. Fix any carpet that is loose or worn. What can I do on the outside of my home?  Use bright outdoor lighting.  Regularly fix the edges of walkways and driveways and fix any cracks.  Remove anything that might make you trip as you walk through a door, such as a raised step or threshold.  Trim any bushes or trees on the path to your home.  Regularly check to see if handrails are loose or broken. Make sure that both sides of any steps have handrails.  Install guardrails along the edges of any raised decks and porches.  Clear walking paths of anything that might make someone trip, such as tools or rocks.  Have any  leaves, snow, or ice cleared regularly.  Use sand or salt on walking paths during winter.  Clean up any spills in your garage right away. This includes grease or oil spills. What other actions can I take?  Wear shoes that: ? Have a low heel. Do not wear high heels. ? Have rubber bottoms. ? Are comfortable and fit you well. ? Are closed at the toe. Do not wear open-toe sandals.  Use tools that help you move around (mobility aids) if they are needed. These include: ? Canes. ? Walkers. ? Scooters. ? Crutches.  Review your medicines with your doctor. Some medicines can make you feel dizzy. This can increase your chance of falling. Ask your doctor what other things you can do to help prevent falls. Where to find more information  Centers for Disease Control and Prevention, STEADI: https://garcia.biz/  [employer] on Aging: BrainJudge.co.uk Contact a doctor if:  You are afraid of falling at home.  You feel weak, drowsy, or dizzy at home.  You fall at home. Summary  There are many simple things that you can do to make your home safe and to help prevent falls.  Ways to make your home safe include removing tripping hazards and installing grab bars in the bathroom.  Ask for help when making these changes in your home. This information is not intended to replace advice given to you by your health care provider. Make sure you discuss any questions you have with your health care provider. Document Revised: 04/01/2019 Document Reviewed: 07/24/2017 Elsevier Patient Education  [place] Oxygen Use, Adult When a medical condition keeps you from getting enough oxygen, your health care provider may instruct you to take extra oxygen at home. Your health care provider will let you know:  When to take oxygen.  For how long to take oxygen.  How quickly oxygen should be delivered (flow rate), in liters per minute (LPM or L/M). Home oxygen can be given  through:  A mask.  A nasal cannula. This is a device or tube that goes in the nostrils.  A transtracheal catheter. This is a small, flexible tube placed in the trachea.  A tracheostomy. This is a surgically made opening in the trachea. These devices are connected with tubing to an oxygen source, such as:  A tank. Tanks hold oxygen in gas form. They must be replaced when the oxygen is used up.  A liquid oxygen device. This holds oxygen in liquid form. It must be  replaced when the oxygen is used up.  An oxygen concentrator machine. This filters oxygen in the room. It uses electricity, so you must have a backup cylinder of oxygen in case the power goes out. Supplies needed: To use oxygen, you will need:  A mask, nasal cannula, transtracheal catheter, or tracheostomy.  An oxygen tank, a liquid oxygen device, or an oxygen concentrator.  The tape that your health care provider recommends (optional). If you use a transtracheal catheter and your prescribed flow rate is 1 LPM or greater, you will also need a humidifier. Risks and complications  Fire. This can happen if the oxygen is exposed to a heat source, flame, or spark.  Injury to skin. This can happen if liquid oxygen touches your skin.  Organ damage. This can happen if you get too little oxygen. How to use oxygen Your health care provider or a representative from your [place] will show you how to use your oxygen device. Follow her or his instructions. The instructions may look something like this: 1. Wash your hands. 2. If you use an oxygen concentrator, make sure it is plugged in. 3. Place one end of the tube into the port on the tank, device, or machine. 4. Place the mask over your nose and mouth. Or, place the nasal cannula and secure it with tape if instructed. If you use a tracheostomy or transtracheal catheter, connect it to the oxygen source as directed. 5. Make sure the liter-flow setting on the machine is  at the level prescribed by your health care provider. 6. Turn on the machine or adjust the knob on the tank or device to the correct liter-flow setting. 7. When you are done, turn off and unplug the machine, or turn the knob to OFF. How to clean and care for the oxygen supplies Nasal cannula  Clean it with a warm, wet cloth daily or as needed.  Wash it with a liquid soap once a week.  Rinse it thoroughly once or twice a week.  Replace it every 2-4 weeks.  If you have an infection, such as a cold or pneumonia, change the cannula when you get better. Mask  Replace it every 2-4 weeks.  If you have an infection, such as a cold or pneumonia, change the mask when you get better. Humidifier bottle  Wash the bottle between each refill: ? Wash it with soap and warm water. ? Rinse it thoroughly. ? Disinfect it and its top. ? Air-dry it.  Make sure it is dry before you refill it. Oxygen concentrator  Clean the air filter at least twice a week according to directions from your home medical equipment and service company.  Wipe down the cabinet every day. To do this: ? Unplug the unit. ? Wipe down the cabinet with a damp cloth. ? Dry the cabinet. Other equipment  Change any extra tubing every 1-3 months.  Follow instructions from your health care provider about taking care of any other equipment. Safety tips Fire safety tips   Keep your oxygen and oxygen supplies at least 5 ft away from sources of heat, flames, and sparks at all times.  Do not allow smoking near your oxygen. Put up "no smoking" signs in your home. Avoid smoking areas when in public.  Do not use materials that can burn (are flammable) while you use oxygen.  When you go to a restaurant with portable oxygen, ask to be seated in the nonsmoking section.  Keep a Data processing manager  close by. Let your fire department know that you have oxygen in your home.  Test your home smoke detectors  regularly. Traveling  Secure your oxygen tank in the vehicle so that it does not move around. Follow instructions from your medical device company about how to safely secure your tank.  Make sure you have enough oxygen for the amount of time you will be away from home.  If you are planning air travel, contact the airline to find out if they allow the use of an approved portable oxygen concentrator. You may also need documents from your health care provider and medical device company before you travel. General safety tips  If you use an oxygen cylinder, make sure it is in a stand or secured to an object that will not move (fixed object).  If you use liquid oxygen, make sure its container is kept upright.  If you use an oxygen concentrator: ? Dance movement psychotherapist company. Make sure you are given priority service in the event that your power goes out. ? Avoid using extension cords, if possible. Follow these instructions at home:  Use oxygen only as told by your health care provider.  Do not use alcohol or other drugs that make you relax (sedating drugs) unless instructed. They can slow down your breathing rate and make it hard to get in enough oxygen.  Know how and when to order a refill of oxygen.  Always keep a spare tank of oxygen. Plan ahead for holidays when you may not be able to get a prescription filled.  Use water-based lubricants on your lips or nostrils. Do not use oil-based products like petroleum jelly.  To prevent skin irritation on your cheeks or behind your ears, tuck some gauze under the tubing. Contact a health care provider if:  You get headaches often.  You have shortness of breath.  You have a lasting cough.  You have anxiety.  You are sleepy all the time.  You develop an illness that affects your breathing.  You cannot exercise at your regular level.  You are restless.  You have difficult or irregular breathing, and it is getting worse.  You have a  fever.  You have persistent redness under your nose. Get help right away if:  You are confused.  You have blue lips or fingernails.  You are struggling to breathe. Summary  Your health care provider or a representative from your [place] will show you how to use your oxygen device. Follow her or his instructions.  If you use an oxygen concentrator, make sure it is plugged in.  Make sure the liter-flow setting on the machine is at the level prescribed by your health care provider.  Keep your oxygen and oxygen supplies at least 5 ft away from sources of heat, flames, and sparks at all times. This information is not intended to replace advice given to you by your health care provider. Make sure you discuss any questions you have with your health care provider. Document Revised: 05/28/2018 Document Reviewed: 07/02/2016 Elsevier Patient Education  [place]. COPD and Physical Activity Chronic obstructive pulmonary disease (COPD) is a long-term (chronic) condition that affects the lungs. COPD is a general term that can be used to describe many different lung problems that cause lung swelling (inflammation) and limit airflow, including chronic bronchitis and emphysema. The main symptom of COPD is shortness of breath, which makes it harder to do even simple tasks. This can also make it harder  to exercise and be active. Talk with your health care provider about treatments to help you breathe better and actions you can take to prevent breathing problems during physical activity. What are the benefits of exercising with COPD? Exercising regularly is an important part of a healthy lifestyle. You can still exercise and do physical activities even though you have COPD. Exercise and physical activity improve your shortness of breath by increasing blood flow (circulation). This causes your heart to pump more oxygen through your body. Moderate exercise can improve your:  Oxygen  use.  Energy level.  Shortness of breath.  Strength in your breathing muscles.  Heart health.  Sleep.  Self-esteem and feelings of self-worth.  Depression, stress, and anxiety levels. Exercise can benefit everyone with COPD. The severity of your disease may affect how hard you can exercise, especially at first, but everyone can benefit. Talk with your health care provider about how much exercise is safe for you, and which activities and exercises are safe for you. What actions can I take to prevent breathing problems during physical activity?  Sign up for a pulmonary rehabilitation program. This type of program may include: ? Education about lung diseases. ? Exercise classes that teach you how to exercise and be more active while improving your breathing. This usually involves:  Exercise using your lower extremities, such as a stationary bicycle.  About 30 minutes of exercise, 2 to 5 times per week, for 6 to 12 weeks  Strength training, such as push ups or leg lifts. ? Nutrition education. ? Group classes in which you can talk with others who also have COPD and learn ways to manage stress.  If you use an oxygen tank, you should use it while you exercise. Work with your health care provider to adjust your oxygen for your physical activity. Your resting flow rate is different from your flow rate during physical activity.  While you are exercising: ? Take slow breaths. ? Pace yourself and do not try to go too fast. ? Purse your lips while breathing out. Pursing your lips is similar to a kissing or whistling position. ? If doing exercise that uses a quick burst of effort, such as weight lifting:  Breathe in before starting the exercise.  Breathe out during the hardest part of the exercise (such as raising the weights). Where to find support You can find support for exercising with COPD from:  Your health care provider.  A pulmonary rehabilitation program.  Your local  health department or community health programs.  Support groups, online or in-person. Your health care provider may be able to recommend support groups. Where to find more information You can find more information about exercising with COPD from:  American Lung Association: ClassInsider.se.  COPD Foundation: https://www.rivera.net/. Contact a health care provider if:  Your symptoms get worse.  You have chest pain.  You have nausea.  You have a fever.  You have trouble talking or catching your breath.  You want to start a new exercise program or a new activity. Summary  COPD is a general term that can be used to describe many different lung problems that cause lung swelling (inflammation) and limit airflow. This includes chronic bronchitis and emphysema.  Exercise and physical activity improve your shortness of breath by increasing blood flow (circulation). This causes your heart to provide more oxygen to your body.  Contact your health care provider before starting any exercise program or new activity. Ask your health care provider what exercises  and activities are safe for you. This information is not intended to replace advice given to you by your health care provider. Make sure you discuss any questions you have with your health care provider. Document Revised: 03/31/2019 Document Reviewed: 01/01/2018 Elsevier Patient Education  2020 [employer].

## 2020-12-23 ENCOUNTER — Telehealth: Payer: Self-pay | Admitting: Pulmonary Disease

## 2020-12-23 NOTE — Telephone Encounter (Signed)
Palliative care nurse called answering service  Patient having fever  I did return the call and asked whether the patient does not have a primary care doctor for palliative care services  Megan the nurse told me that they did call their doctor and they do not need any intervention from my end

## 2020-12-24 ENCOUNTER — Inpatient Hospital Stay (HOSPITAL_BASED_OUTPATIENT_CLINIC_OR_DEPARTMENT_OTHER)
Admission: EM | Admit: 2020-12-24 | Discharge: 2020-12-28 | DRG: 177 | Disposition: A | Discharge status: Home / Self Care | Payer: Medicare Other | Attending: Internal Medicine | Admitting: Internal Medicine

## 2020-12-24 ENCOUNTER — Other Ambulatory Visit: Payer: Self-pay

## 2020-12-24 ENCOUNTER — Emergency Department (HOSPITAL_BASED_OUTPATIENT_CLINIC_OR_DEPARTMENT_OTHER): Payer: Medicare Other

## 2020-12-24 ENCOUNTER — Encounter (HOSPITAL_BASED_OUTPATIENT_CLINIC_OR_DEPARTMENT_OTHER): Payer: Self-pay | Admitting: Emergency Medicine

## 2020-12-24 DIAGNOSIS — I700 Atherosclerosis of aorta: Secondary | ICD-10-CM | POA: Diagnosis present

## 2020-12-24 DIAGNOSIS — Z7901 Long term (current) use of anticoagulants: Secondary | ICD-10-CM

## 2020-12-24 DIAGNOSIS — I480 Paroxysmal atrial fibrillation: Secondary | ICD-10-CM | POA: Diagnosis not present

## 2020-12-24 DIAGNOSIS — R6889 Other general symptoms and signs: Secondary | ICD-10-CM

## 2020-12-24 DIAGNOSIS — Z8249 Family history of ischemic heart disease and other diseases of the circulatory system: Secondary | ICD-10-CM

## 2020-12-24 DIAGNOSIS — J9621 Acute and chronic respiratory failure with hypoxia: Secondary | ICD-10-CM | POA: Diagnosis not present

## 2020-12-24 DIAGNOSIS — U071 COVID-19: Secondary | ICD-10-CM

## 2020-12-24 DIAGNOSIS — J449 Chronic obstructive pulmonary disease, unspecified: Secondary | ICD-10-CM | POA: Diagnosis not present

## 2020-12-24 DIAGNOSIS — E782 Mixed hyperlipidemia: Secondary | ICD-10-CM | POA: Diagnosis present

## 2020-12-24 DIAGNOSIS — Z87891 Personal history of nicotine dependence: Secondary | ICD-10-CM

## 2020-12-24 DIAGNOSIS — E1159 Type 2 diabetes mellitus with other circulatory complications: Secondary | ICD-10-CM | POA: Diagnosis present

## 2020-12-24 DIAGNOSIS — Z9981 Dependence on supplemental oxygen: Secondary | ICD-10-CM

## 2020-12-24 DIAGNOSIS — R972 Elevated prostate specific antigen [PSA]: Secondary | ICD-10-CM | POA: Diagnosis present

## 2020-12-24 DIAGNOSIS — I5032 Chronic diastolic (congestive) heart failure: Secondary | ICD-10-CM | POA: Diagnosis not present

## 2020-12-24 DIAGNOSIS — Z79899 Other long term (current) drug therapy: Secondary | ICD-10-CM

## 2020-12-24 DIAGNOSIS — F411 Generalized anxiety disorder: Secondary | ICD-10-CM | POA: Diagnosis present

## 2020-12-24 DIAGNOSIS — M810 Age-related osteoporosis without current pathological fracture: Secondary | ICD-10-CM | POA: Diagnosis present

## 2020-12-24 DIAGNOSIS — R0602 Shortness of breath: Secondary | ICD-10-CM | POA: Diagnosis not present

## 2020-12-24 DIAGNOSIS — Z888 Allergy status to other drugs, medicaments and biological substances status: Secondary | ICD-10-CM

## 2020-12-24 DIAGNOSIS — E1169 Type 2 diabetes mellitus with other specified complication: Secondary | ICD-10-CM

## 2020-12-24 DIAGNOSIS — I110 Hypertensive heart disease with heart failure: Secondary | ICD-10-CM | POA: Diagnosis present

## 2020-12-24 DIAGNOSIS — R059 Cough, unspecified: Secondary | ICD-10-CM | POA: Diagnosis not present

## 2020-12-24 DIAGNOSIS — R079 Chest pain, unspecified: Secondary | ICD-10-CM | POA: Diagnosis not present

## 2020-12-24 DIAGNOSIS — R509 Fever, unspecified: Secondary | ICD-10-CM | POA: Diagnosis not present

## 2020-12-24 DIAGNOSIS — J441 Chronic obstructive pulmonary disease with (acute) exacerbation: Secondary | ICD-10-CM | POA: Diagnosis not present

## 2020-12-24 DIAGNOSIS — E785 Hyperlipidemia, unspecified: Secondary | ICD-10-CM | POA: Diagnosis present

## 2020-12-24 DIAGNOSIS — Z9841 Cataract extraction status, right eye: Secondary | ICD-10-CM

## 2020-12-24 DIAGNOSIS — J439 Emphysema, unspecified: Secondary | ICD-10-CM | POA: Diagnosis not present

## 2020-12-24 DIAGNOSIS — Z7951 Long term (current) use of inhaled steroids: Secondary | ICD-10-CM

## 2020-12-24 DIAGNOSIS — I10 Essential (primary) hypertension: Secondary | ICD-10-CM | POA: Diagnosis present

## 2020-12-24 DIAGNOSIS — I152 Hypertension secondary to endocrine disorders: Secondary | ICD-10-CM | POA: Diagnosis present

## 2020-12-24 HISTORY — DX: COVID-19: U07.1

## 2020-12-24 LAB — COMPREHENSIVE METABOLIC PANEL
ALT: 31 U/L (ref 0–44)
AST: 27 U/L (ref 15–41)
Albumin: 3.4 g/dL — ABNORMAL LOW (ref 3.5–5.0)
Alkaline Phosphatase: 58 U/L (ref 38–126)
Anion gap: 10 (ref 5–15)
BUN: 19 mg/dL (ref 8–23)
CO2: 30 mmol/L (ref 22–32)
Calcium: 8.9 mg/dL (ref 8.9–10.3)
Chloride: 99 mmol/L (ref 98–111)
Creatinine, Ser: 1 mg/dL (ref 0.61–1.24)
GFR, Estimated: >60 mL/min (ref >60)
Glucose, Bld: 159 mg/dL — ABNORMAL HIGH (ref 70–99)
Potassium: 4 mmol/L (ref 3.5–5.1)
Sodium: 139 mmol/L (ref 135–145)
Total Bilirubin: 0.6 mg/dL (ref 0.3–1.2)
Total Protein: 6.8 g/dL (ref 6.5–8.1)

## 2020-12-24 LAB — LACTIC ACID, PLASMA
Lactic Acid, Venous: 2.6 mmol/L (ref 0.5–1.9)
Lactic Acid, Venous: 1.3 mmol/L (ref 0.5–1.9)

## 2020-12-24 LAB — URINALYSIS, ROUTINE W REFLEX MICROSCOPIC
Bilirubin Urine: NEGATIVE
Glucose, UA: NEGATIVE mg/dL
Hgb urine dipstick: NEGATIVE
Ketones, ur: NEGATIVE mg/dL
Leukocytes,Ua: NEGATIVE
Nitrite: NEGATIVE
Protein, ur: NEGATIVE mg/dL
Specific Gravity, Urine: 1.015 (ref 1.005–1.030)
pH: 7 (ref 5.0–8.0)

## 2020-12-24 LAB — CBC WITH DIFFERENTIAL/PLATELET
Abs Immature Granulocytes: 0.04 10*3/uL (ref 0.00–0.07)
Basophils Absolute: 0 10*3/uL (ref 0.0–0.1)
Basophils Relative: 0 %
Eosinophils Absolute: 0 10*3/uL (ref 0.0–0.5)
Eosinophils Relative: 0 %
HCT: 41.8 % (ref 39.0–52.0)
Hemoglobin: 12.8 g/dL — ABNORMAL LOW (ref 13.0–17.0)
Immature Granulocytes: 0 %
Lymphocytes Relative: 5 %
Lymphs Abs: 0.6 10*3/uL — ABNORMAL LOW (ref 0.7–4.0)
MCH: 24.7 pg — ABNORMAL LOW (ref 26.0–34.0)
MCHC: 30.6 g/dL (ref 30.0–36.0)
MCV: 80.7 fL (ref 80.0–100.0)
Monocytes Absolute: 0.7 10*3/uL (ref 0.1–1.0)
Monocytes Relative: 6 %
Neutro Abs: 10.6 10*3/uL — ABNORMAL HIGH (ref 1.7–7.7)
Neutrophils Relative %: 89 %
Platelets: 223 10*3/uL (ref 150–400)
RBC: 5.18 MIL/uL (ref 4.22–5.81)
RDW: 16.2 % — ABNORMAL HIGH (ref 11.5–15.5)
WBC: 12 10*3/uL — ABNORMAL HIGH (ref 4.0–10.5)
nRBC: 0 % (ref 0.0–0.2)

## 2020-12-24 LAB — PROCALCITONIN: Procalcitonin: <0.1 ng/mL

## 2020-12-24 LAB — PROTIME-INR
INR: 1.4 — ABNORMAL HIGH (ref 0.8–1.2)
Prothrombin Time: 16.8 seconds — ABNORMAL HIGH (ref 11.4–15.2)

## 2020-12-24 LAB — LACTATE DEHYDROGENASE: LDH: 175 U/L (ref 98–192)

## 2020-12-24 LAB — TRIGLYCERIDES: Triglycerides: 104 mg/dL (ref <150)

## 2020-12-24 LAB — C-REACTIVE PROTEIN: CRP: 6.3 mg/dL — ABNORMAL HIGH (ref <1.0)

## 2020-12-24 LAB — FERRITIN: Ferritin: 214 ng/mL (ref 24–336)

## 2020-12-24 LAB — RESP PANEL BY RT-PCR (FLU A&B, COVID) ARPGX2
Influenza A by PCR: NEGATIVE
Influenza B by PCR: NEGATIVE
SARS Coronavirus 2 by RT PCR: POSITIVE — AB

## 2020-12-24 LAB — FIBRINOGEN: Fibrinogen: 588 mg/dL — ABNORMAL HIGH (ref 210–475)

## 2020-12-24 LAB — D-DIMER, QUANTITATIVE: D-Dimer, Quant: 2.21 ug/mL-FEU — ABNORMAL HIGH (ref 0.00–0.50)

## 2020-12-24 MED ORDER — MAGNESIUM SULFATE 2 GM/50ML IV SOLN
2.0000 g | Freq: Once | INTRAVENOUS | Status: AC
  Administered 2020-12-24: 2 g via INTRAVENOUS
  Filled 2020-12-24: qty 50

## 2020-12-24 MED ORDER — DILTIAZEM HCL ER COATED BEADS 180 MG PO CP24
180.0000 mg | ORAL_CAPSULE | Freq: Every day | ORAL | Status: DC
  Filled 2020-12-24: qty 1

## 2020-12-24 MED ORDER — SODIUM CHLORIDE 0.9 % IV SOLN
100.0000 mg | Freq: Every day | INTRAVENOUS | Status: AC
  Administered 2020-12-25 – 2020-12-28 (×4): 100 mg via INTRAVENOUS
  Filled 2020-12-24 (×3): qty 20

## 2020-12-24 MED ORDER — METHYLPREDNISOLONE SODIUM SUCC 125 MG IJ SOLR
62.5000 mg | Freq: Once | INTRAMUSCULAR | Status: AC
  Administered 2020-12-24: 62.5 mg via INTRAVENOUS
  Filled 2020-12-24: qty 2

## 2020-12-24 MED ORDER — PANTOPRAZOLE SODIUM 40 MG PO TBEC
40.0000 mg | DELAYED_RELEASE_TABLET | Freq: Once | ORAL | Status: AC
  Administered 2020-12-24: 40 mg via ORAL
  Filled 2020-12-24: qty 1

## 2020-12-24 MED ORDER — HEPARIN SODIUM (PORCINE) 5000 UNIT/ML IJ SOLN
5000.0000 [IU] | Freq: Three times a day (TID) | INTRAMUSCULAR | Status: DC
Start: 2020-12-24 — End: 2020-12-24
  Administered 2020-12-24: 5000 [IU] via SUBCUTANEOUS
  Filled 2020-12-24: qty 1

## 2020-12-24 MED ORDER — ASCORBIC ACID 500 MG PO TABS
500.0000 mg | ORAL_TABLET | Freq: Every day | ORAL | Status: DC
  Administered 2020-12-24 – 2020-12-28 (×4): 500 mg via ORAL
  Filled 2020-12-24 (×5): qty 1

## 2020-12-24 MED ORDER — METHYLPREDNISOLONE SODIUM SUCC 125 MG IJ SOLR
60.0000 mg | Freq: Two times a day (BID) | INTRAMUSCULAR | Status: DC
  Administered 2020-12-24 – 2020-12-25 (×3): 60 mg via INTRAVENOUS
  Filled 2020-12-24 (×3): qty 2

## 2020-12-24 MED ORDER — APIXABAN 5 MG PO TABS
5.0000 mg | ORAL_TABLET | Freq: Two times a day (BID) | ORAL | Status: DC
  Administered 2020-12-24 – 2020-12-28 (×8): 5 mg via ORAL
  Filled 2020-12-24: qty 2
  Filled 2020-12-24 (×5): qty 1
  Filled 2020-12-24: qty 2
  Filled 2020-12-24: qty 1

## 2020-12-24 MED ORDER — DILTIAZEM HCL ER COATED BEADS 120 MG PO CP24
120.0000 mg | ORAL_CAPSULE | Freq: Every day | ORAL | Status: DC
  Administered 2020-12-24 – 2020-12-28 (×4): 120 mg via ORAL
  Filled 2020-12-24 (×4): qty 1

## 2020-12-24 MED ORDER — ZINC SULFATE 220 (50 ZN) MG PO CAPS
220.0000 mg | ORAL_CAPSULE | Freq: Every day | ORAL | Status: DC
  Administered 2020-12-24 – 2020-12-28 (×4): 220 mg via ORAL
  Filled 2020-12-24 (×5): qty 1

## 2020-12-24 MED ORDER — VITAMIN D 25 MCG (1000 UNIT) PO TABS
1000.0000 [IU] | ORAL_TABLET | Freq: Every day | ORAL | Status: DC
  Administered 2020-12-24 – 2020-12-28 (×5): 1000 [IU] via ORAL
  Filled 2020-12-24 (×5): qty 1

## 2020-12-24 MED ORDER — ALBUTEROL SULFATE HFA 108 (90 BASE) MCG/ACT IN AERS
INHALATION_SPRAY | RESPIRATORY_TRACT | Status: AC
  Administered 2020-12-24: 4
  Filled 2020-12-24: qty 6.7

## 2020-12-24 MED ORDER — SODIUM CHLORIDE 0.9 % IV SOLN: 100.0000 mg | Freq: Every day | INTRAVENOUS | Status: DC

## 2020-12-24 MED ORDER — SODIUM CHLORIDE 0.9 % IV SOLN
200.0000 mg | Freq: Once | INTRAVENOUS | Status: DC
  Filled 2020-12-24: qty 40

## 2020-12-24 MED ORDER — MOMETASONE FURO-FORMOTEROL FUM 200-5 MCG/ACT IN AERO
2.0000 | INHALATION_SPRAY | Freq: Two times a day (BID) | RESPIRATORY_TRACT | Status: DC
  Administered 2020-12-25 – 2020-12-28 (×6): 2 via RESPIRATORY_TRACT
  Filled 2020-12-24 (×3): qty 8.8

## 2020-12-24 MED ORDER — PREDNISONE 20 MG PO TABS: 50.0000 mg | ORAL_TABLET | Freq: Every day | ORAL | Status: DC

## 2020-12-24 MED ORDER — SODIUM CHLORIDE 0.9 % IV SOLN
100.0000 mg | Freq: Once | INTRAVENOUS | Status: AC
  Administered 2020-12-24: 100 mg via INTRAVENOUS

## 2020-12-24 MED ORDER — SODIUM CHLORIDE 0.9 % IV BOLUS
1000.0000 mL | Freq: Once | INTRAVENOUS | Status: AC
  Administered 2020-12-24: 1000 mL via INTRAVENOUS

## 2020-12-24 MED ORDER — LORATADINE 10 MG PO TABS
10.0000 mg | ORAL_TABLET | Freq: Once | ORAL | Status: AC
  Administered 2020-12-24: 10 mg via ORAL
  Filled 2020-12-24: qty 1

## 2020-12-24 NOTE — ED Triage Notes (Addendum)
Hx of COPD, wears 2L O2. C/o increased SOB, cough, congestion since yesterday. O2 92-95% on O2. Tmax at home 103. He had tylenol at 830a

## 2020-12-24 NOTE — ED Notes (Signed)
He tells [place] he is "breathing a lot better". Dr. Fredderick Phenix has just seen him.

## 2020-12-24 NOTE — ED Notes (Signed)
His work of breathing is now very nearly normal. He tells [place] he feels "much better".

## 2020-12-24 NOTE — ED Notes (Signed)
Pt requesting his home medications, specifically Symbicort and Eliquis. EDP notified.

## 2020-12-24 NOTE — ED Notes (Signed)
Date and time results received: 12/24/20 1106  Test:lactic acid Critical Value:2.6 Name of Provider Notified: [place] Orders Received? Or Actions Taken?: no orders given

## 2020-12-24 NOTE — Care Plan (Signed)
Patient is a 75 year old gentleman history of hypertension, hyperlipidemia, COPD on 2 L home O2 at bedtime and as needed, recent hospitalization for COPD exacerbation December 2021 presented to the ED with a T-max of 103, worsening shortness of breath, wheezing, some increased work of breathing.  Currently on 3 L nasal cannula with sats of 98%.  COVID-19 PCR positive.  Patient has been vaccinated x2 as well as the booster.  Patient given a dose of IV Solu-Medrol.  ED physician asking for patient to be admitted due to his underlying COPD, comorbidities. Patient accepted to telemetry bed at [facility].  Asked ED provider to start patient on IV remdesivir, Solu-Medrol 60 mg IV every 12 hours, vitamin C, zinc, Combivent every 6 hours.  Inflammatory markers pending and ED physician instructed if procalcitonin elevated may start patient empirically on antibiotics.  No charge.

## 2020-12-24 NOTE — ED Provider Notes (Signed)
[facility]PART[facility] Provider Note   CSN: [id number] Arrival date & time: 12/24/20  0950     History Chief Complaint  Patient presents with  . Shortness of Breath    Paul [name] is a 75 y.o. male with PMHx HTN, high cholesterol, COPD, Diabetes, A fib on Eliquis who presents to the ED today with complaint of worsening shortness of breath, cough, and congestion that started yesterday. Pt reports fevers with Tmax 103 this morning; took Tylenol at 8:30 AM prior to arrival. Pt is fully vaccinated against COVID with booster in October. Denies any recent sick contacts however was just discharged from the hospital on 12/19 for a COPD exacerbation. Pt has been on prednisone since his hospitalization. He states he does not typically get a fever with his COPD exacerbations and that is the only thing that feels different now. Pt states he uses oxygen at nighttime and also as needed when he feels short of breath (typically 2L)- currently on 3L as he felt short of breath even with 2L. Denies headache, sore throat, ear pain, abdominal pain, nausea, vomiting, diarrhea, urinary symptoms, or any other associated symptoms.   The history is provided by the patient, medical records and the spouse.       Past Medical History:  Diagnosis Date  . A-fib ([place])   . Anemia   . Aortic atherosclerosis ([place])   . Atrial fibrillation ([place])   . Back pain   . COPD (chronic obstructive pulmonary disease) ([place])   . Diabetes mellitus without complication ([place])   . Elevated PSA   . GAD (generalized anxiety disorder)   . High cholesterol   . Hypertension   . Osteoporosis   . Oxygen deficiency     Patient Active Problem List   Diagnosis Date Noted  . COPD exacerbation ([place]) 12/09/2020  . COPD with acute exacerbation ([place]) 12/08/2020  . Syncope 04/01/2020  . Chronic diastolic CHF (congestive heart failure) ([place]) 04/01/2020  . Osteoporosis with current pathological fracture 02/06/2020  .  Essential hypertension 01/28/2020  . Chronic bilateral thoracic back pain 01/25/2020  . 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 HLD (hyperlipidemia) 01/05/2020  . Vertebral compression fracture ([place]) 05/29/2019  . Chronic anemia 05/29/2019  . Pulmonary nodules 05/29/2019  . Coronary artery calcification 04/06/2019  . Atherosclerosis of aorta ([place]) 04/06/2019  . Paroxysmal atrial fibrillation ([facility])   . Acute on chronic respiratory failure with hypoxia ([place])   . Lung nodule < 6cm on CT 05/29/2016  . Former cigarette smoker 12/05/2015  . COPD GOLD  III      Past Surgical History:  Procedure Laterality Date  . CATARACT EXTRACTION Right   . SKIN SURGERY     shoulders and chest       Family History  Problem Relation Age of Onset  . Heart disease Brother   . Heart disease Mother   . Heart disease Father   . Cancer Paternal Uncle     Social History   Tobacco Use  . Smoking status: Former Smoker    Packs/day: 2.00    Years: 52.00    Pack years: 104.00    Types: Cigarettes    Quit date: 02/03/2009    Years since quitting: 11.8  . Smokeless tobacco: Never Used  . Tobacco comment: Counseled to remain smoke free  Vaping Use  . Vaping Use: Never used  Substance Use Topics  . Alcohol use: Yes    Alcohol/week: 0.0 standard drinks  Comment: occ  . Drug use: No    Home Medications Prior to Admission medications   Medication Sig Start Date End Date Taking? Authorizing Provider  albuterol (PROAIR HFA) 108 (90 Base) MCG/ACT inhaler Inhale 2 puffs into the lungs every 6 (six) hours as needed for wheezing or shortness of breath.     [provider]  albuterol (PROVENTIL) (2.5 MG/3ML) 0.083% nebulizer solution Take 3 mLs (2.5 mg total) by nebulization in the morning and at bedtime. 05/09/20   Parrett, Virgel Bouquet, NP  apixaban (ELIQUIS) 5 MG TABS tablet Take 1 tablet (5 mg total) by mouth 2 (two) times daily. 12/27/19   Jake Bathe, MD  atorvastatin  (LIPITOR) 40 MG tablet TAKE 1 TABLET(40 MG) BY MOUTH DAILY AFTER SUPPER Patient taking differently: Take 40 mg by mouth daily. 12/27/19   Jake Bathe, MD  bisoprolol (ZEBETA) 5 MG tablet Take 1 tablet (5 mg total) by mouth daily. 12/04/20   Jake Bathe, MD  budesonide-formoterol (SYMBICORT) 160-4.5 MCG/ACT inhaler Inhale 2 puffs into the lungs 2 (two) times daily. 07/25/20   Cox, Fritzi Mandes, MD  calcium carbonate (OS-CAL) 600 MG TABS tablet Take 600 mg by mouth daily.    [provider]  cholecalciferol (VITAMIN D3) 25 MCG (1000 UNIT) tablet Take 1,000 Units by mouth daily.    [provider]  denosumab (PROLIA) 60 MG/ML SOSY injection Inject 60 mg into the skin every 6 (six) months.    [provider]  diltiazem (CARDIZEM CD) 180 MG 24 hr capsule Take 1 capsule (180 mg total) by mouth daily. 05/03/20   Jake Bathe, MD  furosemide (LASIX) 40 MG tablet Take 1 tablet (40 mg total) by mouth 2 (two) times daily. 08/23/20   Jake Bathe, MD  guaiFENesin (MUCINEX) 600 MG 12 hr tablet Take 600 mg by mouth 2 (two) times daily.    [provider]  loratadine (CLARITIN) 10 MG tablet Take 10 mg by mouth daily.    [provider]  Melatonin 5 MG TABS Take 10 mg by mouth at bedtime as needed (sleep).    [provider]  omeprazole (PRILOSEC) 20 MG capsule Take 30- 60 min before your first and last meals of the day Patient taking differently: Take 20 mg by mouth daily as needed (acid reflux). 10/03/20   Nyoka Cowden, MD  OXYGEN Inhale into the lungs. Use as directed at night time and when up and walking around.    [provider]  Tiotropium Bromide Monohydrate (SPIRIVA RESPIMAT) 2.5 MCG/ACT AERS Inhale 2 puffs into the lungs daily. 10/03/20   Nyoka Cowden, MD    Allergies    Daliresp [roflumilast] and Alendronate  Review of Systems   Review of Systems  Constitutional: Positive for fever.  Respiratory: Positive for cough, chest tightness  and shortness of breath.   Gastrointestinal: Negative for abdominal pain, diarrhea, nausea and vomiting.  Genitourinary: Negative for difficulty urinating, frequency and hematuria.  All other systems reviewed and are negative.   Physical Exam Updated Vital Signs BP 138/64 (BP Location: Right Arm)   Pulse 98   Temp 98.8 F (37.1 C) (Oral)   Resp (!) 26   Ht 5\' 6"  (1.676 m)   Wt 72.1 kg   SpO2 96%   BMI 25.65 kg/m   Physical Exam Vitals and nursing note reviewed.  Constitutional:      Appearance: He is not ill-appearing or diaphoretic.  HENT:     Head:  Normocephalic and atraumatic.  Eyes:     Conjunctiva/sclera: Conjunctivae normal.  Cardiovascular:     Rate and Rhythm: Normal rate and regular rhythm.     Pulses: Normal pulses.  Pulmonary:     Effort: Pulmonary effort is normal.     Breath sounds: Decreased breath sounds and wheezing present. No rhonchi or rales.     Comments: Currently on 2L O2 satting 95%. Speaking in full sentences without difficulty. Lungs with decreased breath sounds at the bases and end expiratory wheezes.  Chest:     Chest wall: No tenderness.  Abdominal:     Palpations: Abdomen is soft.     Tenderness: There is no abdominal tenderness. There is no guarding or rebound.  Musculoskeletal:     Cervical back: Neck supple.     Right lower leg: No tenderness. No edema.     Left lower leg: No tenderness. No edema.  Skin:    General: Skin is warm and dry.  Neurological:     Mental Status: He is alert.     ED Results / Procedures / Treatments   Labs (all labs ordered are listed, but only abnormal results are displayed) Labs Reviewed  RESP PANEL BY RT-PCR (FLU A&B, COVID) ARPGX2 - Abnormal; Notable for the following components:      Result Value   SARS Coronavirus 2 by RT PCR POSITIVE (*)    All other components within normal limits  COMPREHENSIVE METABOLIC PANEL - Abnormal; Notable for the following components:   Glucose, Bld 159 (*)    Albumin  3.4 (*)    All other components within normal limits  LACTIC ACID, PLASMA - Abnormal; Notable for the following components:   Lactic Acid, Venous 2.6 (*)    All other components within normal limits  CBC WITH DIFFERENTIAL/PLATELET - Abnormal; Notable for the following components:   WBC 12.0 (*)    Hemoglobin 12.8 (*)    MCH 24.7 (*)    RDW 16.2 (*)    Neutro Abs 10.6 (*)    Lymphs Abs 0.6 (*)    All other components within normal limits  PROTIME-INR - Abnormal; Notable for the following components:   Prothrombin Time 16.8 (*)    INR 1.4 (*)    All other components within normal limits  CULTURE, BLOOD (ROUTINE X 2)  CULTURE, BLOOD (ROUTINE X 2)  LACTIC ACID, PLASMA  D-DIMER, QUANTITATIVE (NOT AT [facility])  PROCALCITONIN  LACTATE DEHYDROGENASE  FERRITIN  TRIGLYCERIDES  FIBRINOGEN  C-REACTIVE PROTEIN  URINALYSIS, ROUTINE W REFLEX MICROSCOPIC    EKG None  Radiology DG Chest Port 1 View  Result Date: 12/24/2020 CLINICAL DATA:  Chest pain, cough and fever. EXAM: PORTABLE CHEST 1 VIEW COMPARISON:  12/07/2020 FINDINGS: The cardiac silhouette, mediastinal and hilar contours are within normal limits and stable. There is mild tortuosity and calcification the thoracic aorta. Stable underlying emphysematous changes and hyperinflation. No acute overlying pulmonary process. No pleural effusions or pulmonary lesions. The bony thorax is intact. IMPRESSION: Emphysematous changes but no acute overlying pulmonary process. Electronically Signed   By: Marijo Sanes M.D.   On: 12/24/2020 10:56    Procedures Procedures (including critical care time)  Medications Ordered in ED Medications  albuterol (VENTOLIN HFA) 108 (90 Base) MCG/ACT inhaler (4 puffs  Given 12/24/20 1028)  magnesium sulfate IVPB 2 g 50 mL (0 g Intravenous Stopped 12/24/20 1146)  methylPREDNISolone sodium succinate (SOLU-MEDROL) 125 mg/2 mL injection 62.5 mg (62.5 mg Intravenous Given 12/24/20 1046)  sodium chloride  0.9 % bolus 1,000 mL  (1,000 mLs Intravenous New Bag/Given 12/24/20 1149)    ED Course  I have reviewed the triage vital signs and the nursing notes.  Pertinent labs & imaging results that were available during my care of the patient were reviewed by me and considered in my medical decision making (see chart for details).  Clinical Course as of 12/24/20 1703  Sun Dec 24, 2020  1047 WBC(!): 12.0 [MV]  1118 Lactic Acid, Venous(!!): 2.6 [MV]  1207 SARS Coronavirus 2 by RT PCR(!): POSITIVE [MV]  1316 60 IV q12 solumedrol. Remdesivir. Vitamin C, zinc.  Recheck inflammatory markers tomorrow Heparin.  Combivent dosed every 6 hours Claritin and PPI.   If procalcitonin level elevated, start on abx.  Wean oxygen [MV]  1703 Procalcitonin: <0.10 [MV]    Clinical Course User Index [MV] Eustaquio Maize, PA-C   MDM Rules/Calculators/A&P                          Paul [name] was evaluated in Emergency Department on 12/24/2020 for the symptoms described in the history of present illness. He was evaluated in the context of the global COVID-19 pandemic, which necessitated consideration that the patient might be at risk for infection with the SARS-CoV-2 virus that causes COVID-19. Institutional protocols and algorithms that pertain to the evaluation of patients at risk for COVID-19 are in a state of rapid change based on information released by regulatory bodies including the CDC and federal and state organizations. These policies and algorithms were followed during the patient's care in the ED.  75 year old male with a history of COPD who presents to the ED today with worsening shortness of breath for the past day with associated congestion and cough.  Was however found to have a fever with a T-max 103 today.  Fully vaccinated against Covid with booster in October.  Was just recently discharged from the hospital on 12/19 for COPD exacerbation.  On arrival to the ED patient is afebrile at 98.8, took Tylenol around 8:30 AM this  morning.  He is nontachycardic, mildly tachypneic.  He is currently on 2 L O2 and satting 96%.  He states he uses O2 as needed and at nighttime.  On exam patient has decreased sounds at the bases of his lungs as well as an expiratory wheezes.  Will treat with albuterol inhaler, mag, Solu-Medrol.  Patient is currently on prednisone after his hospital stay, will give him a half dose of solumedrol.  We will plan for EKG, chest x-ray, lab work including Covid testing at this time.  Considering pneumonia given recent hospital stay and fever versus Covid versus other viral illness including flu.   CXR clear without signs of PNA CBC with a leukocytosis 12,000 however improved from previous during hospitalization and pt appears to chronically be slightly elevated with WBC count - suspect from prednisone use  WBC  Date Value Ref Range Status  12/24/2020 12.0 (H) 4.0 - 10.5 K/uL Final  12/10/2020 19.0 (H) 4.0 - 10.5 K/uL Final  12/09/2020 12.3 (H) 4.0 - 10.5 K/uL Final  12/08/2020 12.5 (H) 4.0 - 10.5 K/uL Final   CMP with glucose 159. No other electrolyte abnormalities.  Lactic acid elevated at 2.6; will provide fluids at this time. Suspect elevated s/2 dehydration vs infectious however still awaiting on COVID test  COVID has returned POSITIVE at this time. In the setting of pt's increased oxygen demand, age, and underlying health condition will admit  to hospital as there is concern for decompensation at home. Feel as if pt would benefit from remdesivir. Updated patient on plan; he is in agreement with admission - he reports improvement in his symptoms after magnesium. Question COVID illness causing COPD exacerbation.   Discussed case with [facility] Dr. Grandville Silos who recommends: 60 mg IV Solumedrol q12 hours Remdesivir Heparin  Vitamin C and Zinc Claritin and PPI Combivent dosed every 6 hours if able to do nebs with COVID positive status Starting abx if procalcitonin level is elevated Wean  oxygen And recheck of inflammatory markers tomorrow.  All orders placed. Pt to be admitted. Appreciate his involvement.   This note was prepared using Dragon voice recognition software and may include unintentional dictation errors due to the inherent limitations of voice recognition software.  Final Clinical Impression(s) / ED Diagnoses Final diagnoses:  COVID-19  COPD exacerbation ([place])  Increased oxygen demand    Rx / DC Orders ED Discharge Orders    None       Eustaquio Maize, PA-C 12/24/20 1408    Malvin Johns, MD 12/24/20 1421

## 2020-12-25 ENCOUNTER — Encounter (HOSPITAL_COMMUNITY): Payer: Self-pay | Admitting: Internal Medicine

## 2020-12-25 DIAGNOSIS — J9621 Acute and chronic respiratory failure with hypoxia: Secondary | ICD-10-CM | POA: Diagnosis not present

## 2020-12-25 DIAGNOSIS — M810 Age-related osteoporosis without current pathological fracture: Secondary | ICD-10-CM | POA: Diagnosis present

## 2020-12-25 DIAGNOSIS — Z9841 Cataract extraction status, right eye: Secondary | ICD-10-CM | POA: Diagnosis not present

## 2020-12-25 DIAGNOSIS — Z888 Allergy status to other drugs, medicaments and biological substances status: Secondary | ICD-10-CM | POA: Diagnosis not present

## 2020-12-25 DIAGNOSIS — I10 Essential (primary) hypertension: Secondary | ICD-10-CM

## 2020-12-25 DIAGNOSIS — R0602 Shortness of breath: Secondary | ICD-10-CM | POA: Diagnosis not present

## 2020-12-25 DIAGNOSIS — Z87891 Personal history of nicotine dependence: Secondary | ICD-10-CM | POA: Diagnosis not present

## 2020-12-25 DIAGNOSIS — I110 Hypertensive heart disease with heart failure: Secondary | ICD-10-CM | POA: Diagnosis present

## 2020-12-25 DIAGNOSIS — I480 Paroxysmal atrial fibrillation: Secondary | ICD-10-CM

## 2020-12-25 DIAGNOSIS — E1169 Type 2 diabetes mellitus with other specified complication: Secondary | ICD-10-CM

## 2020-12-25 DIAGNOSIS — I5032 Chronic diastolic (congestive) heart failure: Secondary | ICD-10-CM | POA: Diagnosis not present

## 2020-12-25 DIAGNOSIS — J449 Chronic obstructive pulmonary disease, unspecified: Secondary | ICD-10-CM | POA: Diagnosis not present

## 2020-12-25 DIAGNOSIS — R972 Elevated prostate specific antigen [PSA]: Secondary | ICD-10-CM | POA: Diagnosis present

## 2020-12-25 DIAGNOSIS — I700 Atherosclerosis of aorta: Secondary | ICD-10-CM | POA: Diagnosis present

## 2020-12-25 DIAGNOSIS — Z7901 Long term (current) use of anticoagulants: Secondary | ICD-10-CM | POA: Diagnosis not present

## 2020-12-25 DIAGNOSIS — Z9981 Dependence on supplemental oxygen: Secondary | ICD-10-CM | POA: Diagnosis not present

## 2020-12-25 DIAGNOSIS — E782 Mixed hyperlipidemia: Secondary | ICD-10-CM | POA: Diagnosis not present

## 2020-12-25 DIAGNOSIS — Z79899 Other long term (current) drug therapy: Secondary | ICD-10-CM | POA: Diagnosis not present

## 2020-12-25 DIAGNOSIS — F411 Generalized anxiety disorder: Secondary | ICD-10-CM | POA: Diagnosis present

## 2020-12-25 DIAGNOSIS — U071 COVID-19: Secondary | ICD-10-CM | POA: Diagnosis not present

## 2020-12-25 DIAGNOSIS — Z7951 Long term (current) use of inhaled steroids: Secondary | ICD-10-CM | POA: Diagnosis not present

## 2020-12-25 DIAGNOSIS — Z8249 Family history of ischemic heart disease and other diseases of the circulatory system: Secondary | ICD-10-CM | POA: Diagnosis not present

## 2020-12-25 LAB — GLUCOSE, CAPILLARY: Glucose-Capillary: 193 mg/dL — ABNORMAL HIGH (ref 70–99)

## 2020-12-25 LAB — FERRITIN: Ferritin: 313 ng/mL (ref 24–336)

## 2020-12-25 LAB — TRIGLYCERIDES: Triglycerides: 62 mg/dL (ref <150)

## 2020-12-25 LAB — FIBRINOGEN: Fibrinogen: 656 mg/dL — ABNORMAL HIGH (ref 210–475)

## 2020-12-25 LAB — D-DIMER, QUANTITATIVE: D-Dimer, Quant: 2.54 ug/mL-FEU — ABNORMAL HIGH (ref 0.00–0.50)

## 2020-12-25 LAB — LACTATE DEHYDROGENASE: LDH: 201 U/L — ABNORMAL HIGH (ref 98–192)

## 2020-12-25 LAB — C-REACTIVE PROTEIN: CRP: 13.1 mg/dL — ABNORMAL HIGH (ref <1.0)

## 2020-12-25 LAB — PROCALCITONIN: Procalcitonin: <0.1 ng/mL

## 2020-12-25 MED ORDER — FUROSEMIDE 40 MG PO TABS
40.0000 mg | ORAL_TABLET | Freq: Two times a day (BID) | ORAL | Status: DC
  Administered 2020-12-26 – 2020-12-28 (×5): 40 mg via ORAL
  Filled 2020-12-25 (×5): qty 1

## 2020-12-25 MED ORDER — METHYLPREDNISOLONE SODIUM SUCC 125 MG IJ SOLR
50.0000 mg | Freq: Three times a day (TID) | INTRAMUSCULAR | Status: DC
  Administered 2020-12-25 – 2020-12-26 (×2): 50 mg via INTRAVENOUS
  Filled 2020-12-25 (×2): qty 2

## 2020-12-25 MED ORDER — MELATONIN 5 MG PO TABS
10.0000 mg | ORAL_TABLET | Freq: Every evening | ORAL | Status: DC | PRN
Start: 2020-12-25 — End: 2020-12-28
  Administered 2020-12-25 – 2020-12-27 (×3): 10 mg via ORAL
  Filled 2020-12-25 (×3): qty 2

## 2020-12-25 MED ORDER — BISOPROLOL FUMARATE 5 MG PO TABS
5.0000 mg | ORAL_TABLET | Freq: Every day | ORAL | Status: DC
  Administered 2020-12-26 – 2020-12-28 (×3): 5 mg via ORAL
  Filled 2020-12-25 (×2): qty 1

## 2020-12-25 MED ORDER — LORATADINE 10 MG PO TABS
10.0000 mg | ORAL_TABLET | Freq: Every day | ORAL | Status: DC
  Administered 2020-12-26 – 2020-12-28 (×3): 10 mg via ORAL
  Filled 2020-12-25 (×3): qty 1

## 2020-12-25 MED ORDER — SODIUM CHLORIDE 0.9 % IV SOLN
INTRAVENOUS | Status: DC | PRN
  Administered 2020-12-25: 500 mL via INTRAVENOUS

## 2020-12-25 MED ORDER — SODIUM CHLORIDE 0.9% FLUSH
3.0000 mL | Freq: Two times a day (BID) | INTRAVENOUS | Status: DC
  Administered 2020-12-25 – 2020-12-28 (×6): 3 mL via INTRAVENOUS

## 2020-12-25 MED ORDER — ATORVASTATIN CALCIUM 40 MG PO TABS
40.0000 mg | ORAL_TABLET | Freq: Every day | ORAL | Status: DC
  Administered 2020-12-26 – 2020-12-28 (×3): 40 mg via ORAL
  Filled 2020-12-25 (×3): qty 1

## 2020-12-25 MED ORDER — INSULIN ASPART 100 UNIT/ML ~~LOC~~ SOLN
0.0000 [IU] | Freq: Three times a day (TID) | SUBCUTANEOUS | Status: DC
  Administered 2020-12-26 (×2): 2 [IU] via SUBCUTANEOUS
  Administered 2020-12-26 – 2020-12-27 (×4): 1 [IU] via SUBCUTANEOUS
  Administered 2020-12-28: 2 [IU] via SUBCUTANEOUS

## 2020-12-25 MED ORDER — ACETAMINOPHEN 325 MG PO TABS: 650.0000 mg | ORAL_TABLET | Freq: Four times a day (QID) | ORAL | Status: DC | PRN

## 2020-12-25 MED ORDER — UMECLIDINIUM BROMIDE 62.5 MCG/INH IN AEPB
1.0000 | INHALATION_SPRAY | Freq: Every day | RESPIRATORY_TRACT | Status: DC
  Administered 2020-12-26 – 2020-12-28 (×3): 1 via RESPIRATORY_TRACT
  Filled 2020-12-25: qty 7

## 2020-12-25 MED ORDER — ALBUTEROL SULFATE HFA 108 (90 BASE) MCG/ACT IN AERS
2.0000 | INHALATION_SPRAY | RESPIRATORY_TRACT | Status: DC | PRN
  Administered 2020-12-25: 2 via RESPIRATORY_TRACT

## 2020-12-25 MED ORDER — REMDESIVIR 100 MG IV SOLR
INTRAVENOUS | Status: AC
  Filled 2020-12-25: qty 20

## 2020-12-25 MED ORDER — TIOTROPIUM BROMIDE MONOHYDRATE 2.5 MCG/ACT IN AERS: 2.0000 | INHALATION_SPRAY | Freq: Every day | RESPIRATORY_TRACT | Status: DC

## 2020-12-25 NOTE — H&P (Signed)
History and Physical   Paul [name] ZOX:[id number] DOB: 1946/11/28 DOA: 12/24/2020  Referring MD/NP/PA: Hyman Hopes, PA, EDP PCP: Blane Ohara, MD Outpatient Specialists: Pulmonary, Dr. Sherene Sires  Patient coming from: Home by way of [facility]  Chief Complaint: Fever, shortness of breath  HPI: Paul [name] is a 75 y.o. male with a history of 2L O2 and steroid-dependent COPD, PAF, HTN, HLD, and anxiety who presented to [facility]/25/2019 with 2 days of fever, chills, cough, chest tightness, intermittent wheezing associated with shortness of breath. He had been doing well since recent discharge for COPD exacerbation on 12/10/2020 but developed symptoms abruptly and presented to ED due to their constant, progressive, severe nature.   ED Course: Hypoxia was worse than baseline, though CXR showed no pneumonia. SARS-CoV-2 PCR was positive. CRP elevated to 6.3 and later increased to 13.1 despite initiation of remdesivir and solumedrol. Admission requested and ultimately patient transferred to [facility] 1/3 for further evaluation and management.   On arrival here the patient reports significant improvement in symptoms over the previous 24 hours, though still gets short of breath at rest more than usual with exertion.   Review of Systems: Denies weight loss, changes in vision or hearing, headache, current chest pain, current wheezing, palpitations, abdominal pain, nausea, vomiting, changes in bowel habits, blood in stool, change in bladder habits, myalgias, arthralgias, rash and per HPI. All others reviewed and are negative.   Past Medical History:  Diagnosis Date  . A-fib (HCC)   . Anemia   . Aortic atherosclerosis (HCC)   . Atrial fibrillation (HCC)   . Back pain   . COPD (chronic obstructive pulmonary disease) (HCC)   . Diabetes mellitus without complication (HCC)   . Elevated PSA   . GAD (generalized anxiety disorder)   . High cholesterol   . Hypertension   . Osteoporosis   . Oxygen deficiency    Past Surgical  History:  Procedure Laterality Date  . CATARACT EXTRACTION Right   . SKIN SURGERY     shoulders and chest   - Previous smoker, lives with wife who has no symptoms, no idea how he got covid, has been vaccinated x3. No regular EtOH or any illicit drugs.   Allergies  Allergen Reactions  . Daliresp [Roflumilast]     Nausea, vomiting and diarrhea.  . Alendronate Other (See Comments)    Jittery/nervous   Family History  Problem Relation Age of Onset  . Heart disease Brother   . Heart disease Mother   . Heart disease Father   . Cancer Paternal Uncle    - Family history otherwise reviewed and not pertinent.  Prior to Admission medications   Medication Sig Start Date End Date Taking? Authorizing Provider  albuterol (PROAIR HFA) 108 (90 Base) MCG/ACT inhaler Inhale 2 puffs into the lungs every 6 (six) hours as needed for wheezing or shortness of breath.     [provider]  albuterol (PROVENTIL) (2.5 MG/3ML) 0.083% nebulizer solution Take 3 mLs (2.5 mg total) by nebulization in the morning and at bedtime. 05/09/20   Parrett, Virgel Bouquet, NP  apixaban (ELIQUIS) 5 MG TABS tablet Take 1 tablet (5 mg total) by mouth 2 (two) times daily. 12/27/19   Jake Bathe, MD  atorvastatin (LIPITOR) 40 MG tablet TAKE 1 TABLET(40 MG) BY MOUTH DAILY AFTER SUPPER Patient taking differently: Take 40 mg by mouth daily. 12/27/19   Jake Bathe, MD  bisoprolol (ZEBETA) 5 MG tablet Take 1 tablet (5 mg total) by mouth daily. 12/04/20  Jerline Pain, MD  budesonide-formoterol [facility]) 160-4.5 MCG/ACT inhaler Inhale 2 puffs into the lungs 2 (two) times daily. 07/25/20   Cox, Elnita Maxwell, MD  calcium carbonate (OS-CAL) 600 MG TABS tablet Take 600 mg by mouth daily.    [provider]  cholecalciferol (VITAMIN D3) 25 MCG (1000 UNIT) tablet Take 1,000 Units by mouth daily.    [provider]  denosumab (PROLIA) 60 MG/ML SOSY injection Inject 60 mg into the skin every 6 (six) months.    [provider]  diltiazem (CARDIZEM CD) 180 MG 24 hr capsule Take 1 capsule (180 mg total) by mouth daily. 05/03/20   Jerline Pain, MD  furosemide (LASIX) 40 MG tablet Take 1 tablet (40 mg total) by mouth 2 (two) times daily. 08/23/20   Jerline Pain, MD  guaiFENesin (MUCINEX) 600 MG 12 hr tablet Take 600 mg by mouth 2 (two) times daily.    [provider]  loratadine (CLARITIN) 10 MG tablet Take 10 mg by mouth daily.    [provider]  Melatonin 5 MG TABS Take 10 mg by mouth at bedtime as needed (sleep).    [provider]  omeprazole (PRILOSEC) 20 MG capsule Take 30- 60 min before your first and last meals of the day Patient taking differently: Take 20 mg by mouth daily as needed (acid reflux). 10/03/20   Tanda Rockers, MD  OXYGEN Inhale into the lungs. Use as directed at night time and when up and walking around.    [provider]  Tiotropium Bromide Monohydrate (SPIRIVA RESPIMAT) 2.5 MCG/ACT AERS Inhale 2 puffs into the lungs daily. 10/03/20   Tanda Rockers, MD    Physical Exam: Vitals:   12/25/20 1830 12/25/20 1845 12/25/20 1900 12/25/20 2006  BP: 139/62  137/64 (!) 152/80  Pulse: 78 78 80 92  Resp: 17 (!) 25 (!) 21 (!) 24  Temp:    98.1 F (36.7 C)  TempSrc:    Oral  SpO2: 99% 100% 100% 99%  Weight:      Height:       Constitutional: 75 y.o. male in no distress, calm demeanor Eyes: Lids and conjunctivae normal, PERRL ENMT: Mucous membranes are moist. Posterior pharynx clear of any exudate or lesions. Good dentition.  Neck: normal, supple, no masses, no thyromegaly Respiratory: Non-labored breathing 3L O2 at rest, very diminished globally without crackles or wheezes.  Cardiovascular: Regular rate and rhythm, no murmurs, rubs, or gallops. No carotid bruits. No JVD. No LE edema. Palpable pedal pulses. Abdomen: Normoactive bowel sounds. No tenderness, non-distended, and no masses palpated. No hepatosplenomegaly. GU: No indwelling  catheter Musculoskeletal: No clubbing / cyanosis. No joint deformity upper and lower extremities. Good ROM, no contractures. Normal muscle tone.  Skin: Warm, dry. No rashes, wounds, or ulcers on visualized skin. Neurologic: CN II-XII grossly intact. Speech normal. No focal deficits in motor strength or sensation in all extremities.  Psychiatric: Alert and oriented x3. Normal judgment and insight. Mood euthymic with congruent, pleasant affect.   Labs on Admission: I have personally reviewed following labs and imaging studies  CBC: Recent Labs  Lab 12/24/20 1025  WBC 12.0*  NEUTROABS 10.6*  HGB 12.8*  HCT 41.8  MCV 80.7  PLT [id number]   Basic Metabolic Panel: Recent Labs  Lab 12/24/20 1025  NA 139  K 4.0  CL 99  CO2 30  GLUCOSE 159*  BUN 19  CREATININE 1.00  CALCIUM 8.9   GFR: Estimated Creatinine Clearance:  57.6 mL/min (by C-G formula based on SCr of 1 mg/dL). Liver Function Tests: Recent Labs  Lab 12/24/20 1025  AST 27  ALT 31  ALKPHOS 58  BILITOT 0.6  PROT 6.8  ALBUMIN 3.4*   No results for input(s): LIPASE, AMYLASE in the last 168 hours. No results for input(s): AMMONIA in the last 168 hours. Coagulation Profile: Recent Labs  Lab 12/24/20 1025  INR 1.4*   Cardiac Enzymes: No results for input(s): CKTOTAL, CKMB, CKMBINDEX, TROPONINI in the last 168 hours. BNP (last 3 results) No results for input(s): PROBNP in the last 8760 hours. HbA1C: No results for input(s): HGBA1C in the last 72 hours. CBG: No results for input(s): GLUCAP in the last 168 hours. Lipid Profile: Recent Labs    12/24/20 1253 12/25/20 0949  TRIG 104 62   Thyroid Function Tests: No results for input(s): TSH, T4TOTAL, FREET4, T3FREE, THYROIDAB in the last 72 hours. Anemia Panel: Recent Labs    12/24/20 1025 12/25/20 0949  FERRITIN 214 313   Urine analysis:    Component Value Date/Time   COLORURINE YELLOW 12/24/2020 [place] 12/24/2020 1437   LABSPEC 1.015  12/24/2020 1437   PHURINE 7.0 12/24/2020 1437   GLUCOSEU NEGATIVE 12/24/2020 1437   HGBUR NEGATIVE 12/24/2020 1437   BILIRUBINUR NEGATIVE 12/24/2020 1437   KETONESUR NEGATIVE 12/24/2020 1437   PROTEINUR NEGATIVE 12/24/2020 1437   NITRITE NEGATIVE 12/24/2020 1437   LEUKOCYTESUR NEGATIVE 12/24/2020 1437    Recent Results (from the past 240 hour(s))  Culture, blood (Routine x 2)     Status: None (Preliminary result)   Collection Time: 12/24/20 10:20 AM   Specimen: BLOOD RIGHT FOREARM  Result Value Ref Range Status   Specimen Description   Final    BLOOD RIGHT FOREARM Performed at [facility], [address] [place], [place] [address]    Special Requests   Final    BOTTLES DRAWN AEROBIC AND ANAEROBIC Blood Culture adequate volume Performed at [facility], [address] [place], [place] [address]    Culture   Final    NO GROWTH < 24 HOURS Performed at [facility], [address], [place], [place] [address]    Report Status PENDING  Incomplete  Culture, blood (Routine x 2)     Status: None (Preliminary result)   Collection Time: 12/24/20 11:00 AM   Specimen: BLOOD  Result Value Ref Range Status   Specimen Description   Final    BLOOD BLOOD LEFT FOREARM Performed at [facility], [address] [place], [place] [address]    Special Requests   Final    BOTTLES DRAWN AEROBIC AND ANAEROBIC Blood Culture adequate volume Performed at [facility], [address] [place], [place] [address]    Culture   Final    NO GROWTH < 24 HOURS Performed at [facility], [place] [address], [place], [place] [address]    Report Status PENDING  Incomplete  Resp Panel by RT-PCR (Flu A&B, Covid) Nasopharyngeal Swab     Status: Abnormal   Collection Time: 12/24/20 11:06 AM   Specimen: Nasopharyngeal Swab; Nasopharyngeal(NP) swabs in vial transport medium  Result Value Ref Range Status   SARS Coronavirus 2 by RT PCR POSITIVE (A) NEGATIVE  Final    Comment: RESULT CALLED TO, READ BACK BY AND VERIFIED WITH: C REED,RN AT 1159 12/24/20 BY K BARR (NOTE) SARS-CoV-2 target nucleic acids are  DETECTED.  The SARS-CoV-2 RNA is generally detectable in upper respiratory specimens during the acute phase of infection. Positive results are indicative of the presence of the identified virus, but do not rule out bacterial infection or co-infection with other pathogens not detected by the test. Clinical correlation with patient history and other diagnostic information is necessary to determine patient infection status. The expected result is Negative.  Fact Sheet for Patients: EntrepreneurPulse.com.au  Fact Sheet for Healthcare Providers: IncredibleEmployment.be  This test is not yet approved or cleared by the [place] FDA and  has been authorized for detection and/or diagnosis of SARS-CoV-2 by FDA under an Emergency Use Authorization (EUA).  This EUA will remain in effect (meaning this test can b e used) for the duration of  the COVID-19 declaration under Section 564(b)(1) of the Act, 21 U.S.C. section 360bbb-3(b)(1), unless the authorization is terminated or revoked sooner.     Influenza A by PCR NEGATIVE NEGATIVE Final   Influenza B by PCR NEGATIVE NEGATIVE Final    Comment: (NOTE) The Xpert Xpress SARS-CoV-2/FLU/RSV plus assay is intended as an aid in the diagnosis of influenza from Nasopharyngeal swab specimens and should not be used as a sole basis for treatment. Nasal washings and aspirates are unacceptable for Xpert Xpress SARS-CoV-2/FLU/RSV testing.  Fact Sheet for Patients: EntrepreneurPulse.com.au  Fact Sheet for Healthcare Providers: IncredibleEmployment.be  This test is not yet approved or cleared by the [place] FDA and has been authorized for detection and/or diagnosis of SARS-CoV-2 by FDA under an Emergency Use Authorization (EUA).  This EUA will remain in effect (meaning this test can be used) for the duration of the COVID-19 declaration under Section 564(b)(1) of the Act, 21 U.S.C. section 360bbb-3(b)(1), unless the authorization is terminated or revoked.  Performed at [facility], [address] [place], [place] [address]      Radiological Exams on Admission: DG [facility] 1 View  Result Date: 12/24/2020 CLINICAL DATA:  Chest pain, cough and fever. EXAM: PORTABLE CHEST 1 VIEW COMPARISON:  12/07/2020 FINDINGS: The cardiac silhouette, mediastinal and hilar contours are within normal limits and stable. There is mild tortuosity and calcification the thoracic aorta. Stable underlying emphysematous changes and hyperinflation. No acute overlying pulmonary process. No pleural effusions or pulmonary lesions. The bony thorax is intact. IMPRESSION: Emphysematous changes but no acute overlying pulmonary process. Electronically Signed   By: Marijo Sanes M.D.   On: 12/24/2020 10:56    EKG: Independently reviewed. NSR with diffuse mild artifact.  Assessment/Plan Active Problems:   COPD GOLD  III    Paroxysmal atrial fibrillation (HCC)   Acute on chronic respiratory failure with hypoxia (HCC)   HLD (hyperlipidemia)   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HCC)   Essential hypertension   Chronic diastolic CHF (congestive heart failure) ([place])   COVID-19   Acute on chronic hypoxic respiratory failure due to breakthrough covid-19 infection on chronic COPD: SARS-CoV-2 PCR positive on 12/24/2020 despite moderna vaccinations x3 (last 10/06/2020). Hopeful for mild disease course and suspect need for hospitalization is due to severity of underlying pulmonary disease.  - Continue remdesivir (started 1/2) - Continue steroids with solumedrol at ~2mg /kg divided TID since CRP has risen, with plan to taper if clinically improves. Does not require immunomodulator therapy at this time based on mild hypoxia. PCT negative. -  Encourage OOB, IS, FV, and awake proning if able - Continue airborne, contact precautions  - Monitor CMP and inflammatory markers - Continue anticoagulation.  -  Will order AM ambulatory pulse oximetry as he may be candidate for discharge soon.  COPD without exacerbation: 05/29/16 FEV1 0.83 L (31%) FEV1/FVC 0.40 FEF 25-75 0.30 L (14%) no bronchodilator response p ? rx prior, DLCO corrected 60% - Continue bronchodilators, avoiding nebs while hospitalized due to aerosolization risk.  - Follows up currently scheduled for 01/03/2021 with [place] [facility].  - Note home health palliative RN involvement as outpatient.   PAF, chronic HFpEF: Currently regular rhythm.  - Continue bisoprolol, diltiazem, eliquis, lasix po (does not appear overloaded currently  HLD:  - Continue statin  History of T2DM: Last HbA1c 6.3%.  - Check CBGs and cover with sensitive scale since we're augmenting steroids.   DVT prophylaxis: Eliquis  Code Status: Full  Family Communication: None at bedside Disposition Plan: Anticipate return home Consults called: None  Admission status: Inpatient as the patient will require continued treatment over at least 2 midnights.    Patrecia Pour, MD [facility] [email or web address] 12/25/2020, 9:02 PM

## 2020-12-25 NOTE — ED Notes (Signed)
Pt states he is feeling more short of breath, work of breathing increased & more tachypneic. Respiratory will be in to assess patient.

## 2020-12-25 NOTE — Progress Notes (Signed)
Patient prefers to keep his same schedule with [facility] and take it in the morning.

## 2020-12-25 NOTE — Progress Notes (Signed)
RT to assess pt for increased work of breathing. Upon arrival to room pt in no distress, RR rate and rhythm normal, on 2L oxygen, SpO2 98-100%. Pt endorses that he has coughed up some sputum a couple of times that has been thick and a "mint green color". Pt given 2p Albuterol MDI without complication. Pt lungs diminished bilateral with scattered rhonchi. Pt states no further needs at this time. RT will continue to monitor and be available as needed.

## 2020-12-25 NOTE — ED Notes (Signed)
Pt alert & oriented, NAD, eating at this time

## 2020-12-26 DIAGNOSIS — U071 COVID-19: Secondary | ICD-10-CM | POA: Diagnosis not present

## 2020-12-26 DIAGNOSIS — I5032 Chronic diastolic (congestive) heart failure: Secondary | ICD-10-CM | POA: Diagnosis not present

## 2020-12-26 LAB — COMPREHENSIVE METABOLIC PANEL
ALT: 29 U/L (ref 0–44)
AST: 20 U/L (ref 15–41)
Albumin: 2.9 g/dL — ABNORMAL LOW (ref 3.5–5.0)
Alkaline Phosphatase: 57 U/L (ref 38–126)
Anion gap: 8 (ref 5–15)
BUN: 20 mg/dL (ref 8–23)
CO2: 30 mmol/L (ref 22–32)
Calcium: 8.1 mg/dL — ABNORMAL LOW (ref 8.9–10.3)
Chloride: 104 mmol/L (ref 98–111)
Creatinine, Ser: 0.64 mg/dL (ref 0.61–1.24)
GFR, Estimated: >60 mL/min (ref >60)
Glucose, Bld: 161 mg/dL — ABNORMAL HIGH (ref 70–99)
Potassium: 3.9 mmol/L (ref 3.5–5.1)
Sodium: 142 mmol/L (ref 135–145)
Total Bilirubin: 0.4 mg/dL (ref 0.3–1.2)
Total Protein: 5.9 g/dL — ABNORMAL LOW (ref 6.5–8.1)

## 2020-12-26 LAB — GLUCOSE, CAPILLARY
Glucose-Capillary: 155 mg/dL — ABNORMAL HIGH (ref 70–99)
Glucose-Capillary: 126 mg/dL — ABNORMAL HIGH (ref 70–99)
Glucose-Capillary: 137 mg/dL — ABNORMAL HIGH (ref 70–99)
Glucose-Capillary: 136 mg/dL — ABNORMAL HIGH (ref 70–99)

## 2020-12-26 LAB — FERRITIN: Ferritin: 325 ng/mL (ref 24–336)

## 2020-12-26 LAB — C-REACTIVE PROTEIN: CRP: 8 mg/dL — ABNORMAL HIGH (ref <1.0)

## 2020-12-26 MED ORDER — METHYLPREDNISOLONE SODIUM SUCC 125 MG IJ SOLR
50.0000 mg | Freq: Two times a day (BID) | INTRAMUSCULAR | Status: DC
  Administered 2020-12-26 – 2020-12-28 (×4): 50 mg via INTRAVENOUS
  Filled 2020-12-26 (×4): qty 2

## 2020-12-26 NOTE — Plan of Care (Signed)
  Problem: Education: Goal: Knowledge of General Education information will improve Description: Including pain rating scale, medication(s)/side effects and non-pharmacologic comfort measures Outcome: Progressing   Problem: Activity: Goal: Risk for activity intolerance will decrease Outcome: Progressing   Problem: Coping: Goal: Level of anxiety will decrease Outcome: Progressing   Problem: Safety: Goal: Ability to remain free from injury will improve Outcome: Progressing   Problem: Respiratory: Goal: Will maintain a patent airway Outcome: Progressing   

## 2020-12-26 NOTE — Progress Notes (Signed)
PT Cancellation Note  Patient Details Name: Paul [name] MRN: [id number] DOB: 1946-02-07   Cancelled Treatment:    Reason Eval/Treat Not Completed: PT screened, no needs identified, will sign off Pt reports he has already ambulated 3x today and monitoring his saturations.  Pt denies any PT need at this time.  PT to sign off.   Kyleigha Markert,KATHrine E 12/26/2020, 3:15 PM Paulino Door, DPT Acute Rehabilitation Services Pager: [phone] Office: [phone]

## 2020-12-26 NOTE — Progress Notes (Signed)
   Pt is active with [facility] [place].  Will follow hospital course and plan to resume care upon d/c back home.  Please call [facility] if we can be of assistance with d/c planning.  Julius Bowels, RN Office [phone] Mobile [phone]

## 2020-12-26 NOTE — Progress Notes (Signed)
PROGRESS NOTE  Paul [name]  DOB: 08-Jul-1946  PCP: [name], MD ET:[id number]  DOA: 12/24/2020  LOS: 1 day   Chief Complaint  Patient presents with  . Shortness of Breath    Covid +   Brief narrative: Paul [name] is a 75 y.o. male with PMH significant for DM2, HTN, HLD, A. fib, COPD on 2 L oxygen, anxiety. Patient presented to the ED on 12/24/20 with 2 days of fever up to 103, chills, cough, chest tightness, intermittent wheezing associated with shortness of breath. He had been doing well since recent discharge for COPD exacerbation on 12/10/2020 but developed symptoms abruptly and presented to ED due to their constant, progressive, severe nature.  In the ED, patient required more than 2 L oxygen to maintain oxygen saturation. Covid PCR positive.  Chest x-ray however did not show any infiltrates. CRP was elevated to 6.3 and later increased to 13.1 despite initiation of remdesivir and solumedrol.  Patient was admitted for further evaluation management. Of note patient is status post moderna vaccinations x3 (last 10/06/2020)  Subjective: Patient was seen and examined this am.  Lying down in bed.  Not in distress.  No new symptoms. Chart reviewed No fever, currently on 2 L oxygen by nasal cannula. Labs this morning with CRP trending down.  Assessment/Plan: Breakthrough COVID infection Acute respiratory failure with hypoxia  -Presented with fever, shortness of breath -COVID test: PCR positive on admission -Chest imaging: Did not show any evidence of pneumonia  -Treatment: Currently on a 5-day course of IV remdesivir till 1/6.  On IV Solu-Medrol 50 mg 3 times daily.  -Progression: Improving.  On 2 L oxygen.  Taper down Solu-Medrol to 50 mg twice daily.  Continue remdesivir. -Supportive care: Vitamin C, Zinc, PRN inhalers, Tylenol, Antitussives (benzonatate/ Mucinex/Tussionex).   -Encouraged incentive spirometry, prone position, out of bed and early mobilization as much as  possible -Continue airborne/contact isolation precautions for duration of 3 weeks from the day of diagnosis. -WBC and inflammatory markers trend as below.  Recent Labs  Lab 12/24/20 1025 12/24/20 1026 12/24/20 1106 12/24/20 1253 12/25/20 0949 12/26/20 0333  SARSCOV2NAA  --   --  POSITIVE*  --   --   --   WBC 12.0*  --   --   --   --   --   LATICACIDVEN  --  2.6*  --  1.3  --   --   PROCALCITON  --   --   --  <0.10 <0.10  --   DDIMER  --   --   --  2.21* 2.54*  --   FERRITIN 214  --   --   --  313  --   LDH  --   --   --  175 201*  --   CRP 6.3*  --   --   --  13.1* 8.0*  ALT 31  --   --   --   --  29    The treatment plan and use of medications and known side effects were discussed with patient/family. Some of the medications used are based on case reports/anecdotal data.  All other medications being used in the management of COVID-19 based on limited study data.  Complete risks and long-term side effects are unknown, however in the best clinical judgment they seem to be of some benefit.  Patient wanted to proceed with treatment options provided.  COPD without exacerbation:  -Continue bronchodilators, avoiding nebs while hospitalized due to aerosolization risk.  -  Follows up currently scheduled for 01/03/2021 with [facility].  -Note home health palliative RN involvement as outpatient.   PAF, chronic HFpEF:  -Currently regular rhythm.  -Continue bisoprolol, diltiazem, eliquis, lasix po (does not appear overloaded currently  HLD:  - Continue statin  History of T2DM:  -Last HbA1c 6.3% on 11/29 -Continue sliding scale insulin with Accu-Cheks. Recent Labs  Lab 12/25/20 2127 12/26/20 0938 12/26/20 1114  GLUCAP 193* 155* 126*   Mobility: Encourage ambulation.  PT eval pending Code Status:   Code Status: Full Code  Nutritional status: Body mass index is 25.66 kg/m.     Diet Order            Diet Carb Modified Fluid consistency: Thin; Room service appropriate?  Yes  Diet effective now                 DVT prophylaxis:  apixaban (ELIQUIS) tablet 5 mg   Antimicrobials:  None Fluid: None Consultants: None Family Communication:  Not at bedside  Status is: Inpatient  Remains inpatient appropriate because: Ongoing Covid treatment   Dispo: The patient is from: Home              Anticipated d/c is to: Home, pending PT eval              Anticipated d/c date is: 1 day              Patient currently is not medically stable to d/c.       Infusions:  . sodium chloride Stopped (12/25/20 1108)  . remdesivir 100 mg in NS 100 mL 100 mg (12/26/20 1015)    Scheduled Meds: . apixaban  5 mg Oral BID  . vitamin C  500 mg Oral Daily  . atorvastatin  40 mg Oral Daily  . bisoprolol  5 mg Oral Daily  . cholecalciferol  1,000 Units Oral Daily  . diltiazem  120 mg Oral Daily  . furosemide  40 mg Oral BID  . insulin aspart  0-9 Units Subcutaneous TID WC  . loratadine  10 mg Oral Daily  . methylPREDNISolone (SOLU-MEDROL) injection  50 mg Intravenous Q12H  . mometasone-formoterol  2 puff Inhalation BID  . sodium chloride flush  3 mL Intravenous Q12H  . umeclidinium bromide  1 puff Inhalation Daily  . zinc sulfate  220 mg Oral Daily    Antimicrobials: Anti-infectives (From admission, onward)   Start     Dose/Rate Route Frequency Ordered Stop   12/25/20 1000  remdesivir 100 mg in sodium chloride 0.9 % 100 mL IVPB  Status:  Discontinued       "Followed by" Linked Group Details   100 mg 200 mL/hr over 30 Minutes Intravenous Daily 12/24/20 1336 12/24/20 1346   12/25/20 1000  remdesivir 100 mg in sodium chloride 0.9 % 100 mL IVPB       "Followed by" Linked Group Details   100 mg 200 mL/hr over 30 Minutes Intravenous Daily 12/24/20 1346 12/29/20 0959   12/25/20 0926  remdesivir 100 mg injection       Note to Pharmacy: Lovell Sheehan: cabinet override      12/25/20 0926 12/25/20 1005   12/24/20 1430  remdesivir 100 mg in sodium chloride 0.9 %  100 mL IVPB       "Followed by" Linked Group Details   100 mg 200 mL/hr over 30 Minutes Intravenous Once 12/24/20 1346 12/24/20 1607   12/24/20 1400  remdesivir 100 mg in sodium chloride 0.9 %  100 mL IVPB       "Followed by" Linked Group Details   100 mg 200 mL/hr over 30 Minutes Intravenous Once 12/24/20 1346 12/24/20 1521   12/24/20 1345  remdesivir 200 mg in sodium chloride 0.9% 250 mL IVPB  Status:  Discontinued       "Followed by" Linked Group Details   200 mg 580 mL/hr over 30 Minutes Intravenous Once 12/24/20 1336 12/24/20 1346      PRN meds: sodium chloride, acetaminophen, albuterol, melatonin   Objective: Vitals:   12/26/20 0342 12/26/20 0758  BP: 136/71 (!) 151/76  Pulse: 85 89  Resp: (!) 21 20  Temp: 97.6 F (36.4 C) 98 F (36.7 C)  SpO2: 98% 100%    Intake/Output Summary (Last 24 hours) at 12/26/2020 1257 Last data filed at 12/26/2020 0300 Gross per 24 hour  Intake 0 ml  Output 200 ml  Net -200 ml   Filed Weights   12/24/20 0953 12/24/20 1013 12/25/20 2300  Weight: 69.4 kg 72.1 kg 72.1 kg   Weight change: 2.7 kg Body mass index is 25.66 kg/m.   Physical Exam: General exam: Pleasant elderly Caucasian male.  Not in distress Skin: No rashes, lesions or ulcers. HEENT: Atraumatic, normocephalic, no obvious bleeding Lungs: Clear to auscultation bilaterally CVS: Regular rate and rhythm, no murmur GI/Abd soft, nontender, nondistended, bowel sound present CNS: Alert, awake, oriented x3 Psychiatry: Mood appropriate Extremities: No pedal edema, no calf tenderness  Data Review: I have personally reviewed the laboratory data and studies available.  Recent Labs  Lab 12/24/20 1025  WBC 12.0*  NEUTROABS 10.6*  HGB 12.8*  HCT 41.8  MCV 80.7  PLT 223   Recent Labs  Lab 12/24/20 1025 12/26/20 0333  NA 139 142  K 4.0 3.9  CL 99 104  CO2 30 30  GLUCOSE 159* 161*  BUN 19 20  CREATININE 1.00 0.64  CALCIUM 8.9 8.1*    F/u labs  ordered  Signed, Terrilee Croak, MD [facility] 12/26/2020

## 2020-12-27 ENCOUNTER — Ambulatory Visit: Payer: Medicare Other

## 2020-12-27 ENCOUNTER — Other Ambulatory Visit: Payer: Self-pay

## 2020-12-27 DIAGNOSIS — I10 Essential (primary) hypertension: Secondary | ICD-10-CM

## 2020-12-27 DIAGNOSIS — E782 Mixed hyperlipidemia: Secondary | ICD-10-CM

## 2020-12-27 DIAGNOSIS — E1169 Type 2 diabetes mellitus with other specified complication: Secondary | ICD-10-CM

## 2020-12-27 DIAGNOSIS — I5032 Chronic diastolic (congestive) heart failure: Secondary | ICD-10-CM | POA: Diagnosis not present

## 2020-12-27 DIAGNOSIS — J9621 Acute and chronic respiratory failure with hypoxia: Secondary | ICD-10-CM | POA: Diagnosis not present

## 2020-12-27 DIAGNOSIS — J449 Chronic obstructive pulmonary disease, unspecified: Secondary | ICD-10-CM | POA: Diagnosis not present

## 2020-12-27 DIAGNOSIS — I482 Chronic atrial fibrillation, unspecified: Secondary | ICD-10-CM

## 2020-12-27 LAB — CBC WITH DIFFERENTIAL/PLATELET
Abs Immature Granulocytes: 0.07 10*3/uL (ref 0.00–0.07)
Basophils Absolute: 0 10*3/uL (ref 0.0–0.1)
Basophils Relative: 0 %
Eosinophils Absolute: 0 10*3/uL (ref 0.0–0.5)
Eosinophils Relative: 0 %
HCT: 39 % (ref 39.0–52.0)
Hemoglobin: 11.8 g/dL — ABNORMAL LOW (ref 13.0–17.0)
Immature Granulocytes: 1 %
Lymphocytes Relative: 5 %
Lymphs Abs: 0.6 10*3/uL — ABNORMAL LOW (ref 0.7–4.0)
MCH: 24.8 pg — ABNORMAL LOW (ref 26.0–34.0)
MCHC: 30.3 g/dL (ref 30.0–36.0)
MCV: 82.1 fL (ref 80.0–100.0)
Monocytes Absolute: 0.5 10*3/uL (ref 0.1–1.0)
Monocytes Relative: 3 %
Neutro Abs: 12.3 10*3/uL — ABNORMAL HIGH (ref 1.7–7.7)
Neutrophils Relative %: 91 %
Platelets: 221 10*3/uL (ref 150–400)
RBC: 4.75 MIL/uL (ref 4.22–5.81)
RDW: 16.1 % — ABNORMAL HIGH (ref 11.5–15.5)
WBC: 13.4 10*3/uL — ABNORMAL HIGH (ref 4.0–10.5)
nRBC: 0 % (ref 0.0–0.2)

## 2020-12-27 LAB — COMPREHENSIVE METABOLIC PANEL
ALT: 31 U/L (ref 0–44)
AST: 25 U/L (ref 15–41)
Albumin: 2.9 g/dL — ABNORMAL LOW (ref 3.5–5.0)
Alkaline Phosphatase: 44 U/L (ref 38–126)
Anion gap: 12 (ref 5–15)
BUN: 18 mg/dL (ref 8–23)
CO2: 28 mmol/L (ref 22–32)
Calcium: 7.9 mg/dL — ABNORMAL LOW (ref 8.9–10.3)
Chloride: 100 mmol/L (ref 98–111)
Creatinine, Ser: 0.63 mg/dL (ref 0.61–1.24)
GFR, Estimated: >60 mL/min (ref >60)
Glucose, Bld: 157 mg/dL — ABNORMAL HIGH (ref 70–99)
Potassium: 4.6 mmol/L (ref 3.5–5.1)
Sodium: 140 mmol/L (ref 135–145)
Total Bilirubin: 0.4 mg/dL (ref 0.3–1.2)
Total Protein: 5.9 g/dL — ABNORMAL LOW (ref 6.5–8.1)

## 2020-12-27 LAB — GLUCOSE, CAPILLARY
Glucose-Capillary: 149 mg/dL — ABNORMAL HIGH (ref 70–99)
Glucose-Capillary: 121 mg/dL — ABNORMAL HIGH (ref 70–99)
Glucose-Capillary: 134 mg/dL — ABNORMAL HIGH (ref 70–99)
Glucose-Capillary: 174 mg/dL — ABNORMAL HIGH (ref 70–99)

## 2020-12-27 LAB — C-REACTIVE PROTEIN: CRP: 3.8 mg/dL — ABNORMAL HIGH (ref <1.0)

## 2020-12-27 LAB — FERRITIN: Ferritin: 311 ng/mL (ref 24–336)

## 2020-12-27 LAB — D-DIMER, QUANTITATIVE: D-Dimer, Quant: 2.19 ug/mL-FEU — ABNORMAL HIGH (ref 0.00–0.50)

## 2020-12-27 MED ORDER — APIXABAN 5 MG PO TABS: 5.0000 mg | ORAL_TABLET | Freq: Two times a day (BID) | ORAL | 1 refills | Status: DC

## 2020-12-27 NOTE — Telephone Encounter (Signed)
Prescription refill request for Eliquis received.  Indication: afib Last office visit: 08/23/2020, [place] Scr: 0.63, 12/28/2019 Age: 75 yo Weight: 72.1 kg   Prescription refill sent.

## 2020-12-27 NOTE — Chronic Care Management (AMB) (Signed)
Chronic Care Management Pharmacy  Name: Paul [name]  MRN: [id number] DOB: 1946-12-10  Chief Complaint/ HPI  Percell [name],  75 y.o. , male presents for their Initial CCM visit with the clinical pharmacist via telephone due to COVID-19 Pandemic.  PCP : [name], MD   Plan Recommendations:  Helping patient coordinating affordability for inhalers through patient assistance. Patient's wife has already initiated application for Eliquis through cardiology.   Their chronic conditions include: afib, atherosclerosis of aorta, hypertension, CHF, COPD Gold III, DM type 2 with mixed hyperlipidemia, Osteoporosis, chronic anemia, HLD.   Office Visits: 12/13/2020 Montez Morita for COPD exacerbation - continue current medications. Follow-up with pulmonology as scheduled.  11/22/2020 - increase Lipitor to 80 mg daily.  10/02/2020 - Flu shot.  07/25/2020 - refer to dermatology for atypical skin lesion.  Consult Visit: 12/24/2020 - ED to hospital admission for [place].  12/07/2020 - ED to hospital admission for COPD exacerbation. Discharged on azithromycin and increased prednisone.  12/07/2020 - Wound care Dr. Welton Flakes - dressings, moisturize and massage,compression daily for next few weeks.  12/02/2020 - ED visit for COPD exacerbation.  10/03/2020 - Pulmonology - continue current regimen.  08/23/2020 - Cardiology - decreased diltiazem to 180 mg daily.  Medications: Facility-Administered Encounter Medications as of 12/27/2020  Medication  . [COMPLETED] remdesivir 100 mg in sodium chloride 0.9 % 100 mL IVPB  . [DISCONTINUED] 0.9 %  sodium chloride infusion  . [DISCONTINUED] acetaminophen (TYLENOL) tablet 650 mg  . [DISCONTINUED] albuterol (VENTOLIN HFA) 108 (90 Base) MCG/ACT inhaler 2 puff  . [DISCONTINUED] apixaban (ELIQUIS) tablet 5 mg  . [DISCONTINUED] ascorbic acid (VITAMIN C) tablet 500 mg  . [DISCONTINUED] atorvastatin (LIPITOR) tablet 40 mg  . [DISCONTINUED] bisoprolol (ZEBETA) tablet 5 mg  .  [DISCONTINUED] cholecalciferol (VITAMIN D3) tablet 1,000 Units  . [DISCONTINUED] diltiazem (CARDIZEM CD) 24 hr capsule 120 mg  . [DISCONTINUED] furosemide (LASIX) tablet 40 mg  . [DISCONTINUED] insulin aspart (novoLOG) injection 0-9 Units  . [DISCONTINUED] loratadine (CLARITIN) tablet 10 mg  . [DISCONTINUED] melatonin tablet 10 mg  . [DISCONTINUED] methylPREDNISolone sodium succinate (SOLU-MEDROL) 125 mg/2 mL injection 50 mg  . [DISCONTINUED] mometasone-formoterol (DULERA) 200-5 MCG/ACT inhaler 2 puff  . [DISCONTINUED] sodium chloride flush (NS) 0.9 % injection 3 mL  . [DISCONTINUED] umeclidinium bromide (INCRUSE ELLIPTA) 62.5 MCG/INH 1 puff  . [DISCONTINUED] zinc sulfate capsule 220 mg   Outpatient Encounter Medications as of 12/27/2020  Medication Sig  . albuterol (PROAIR HFA) 108 (90 Base) MCG/ACT inhaler Inhale 2 puffs into the lungs every 6 (six) hours as needed for wheezing or shortness of breath.   Marland Kitchen albuterol (PROVENTIL) (2.5 MG/3ML) 0.083% nebulizer solution Take 3 mLs (2.5 mg total) by nebulization in the morning and at bedtime. (Patient taking differently: Take 2.5 mg by nebulization 2 (two) times daily as needed.)  . apixaban (ELIQUIS) 5 MG TABS tablet Take 1 tablet (5 mg total) by mouth 2 (two) times daily.  Marland Kitchen atorvastatin (LIPITOR) 40 MG tablet TAKE 1 TABLET(40 MG) BY MOUTH DAILY AFTER SUPPER (Patient taking differently: Take 40 mg by mouth daily.)  . bisoprolol (ZEBETA) 5 MG tablet Take 1 tablet (5 mg total) by mouth daily.  . budesonide-formoterol (SYMBICORT) 160-4.5 MCG/ACT inhaler Inhale 2 puffs into the lungs 2 (two) times daily.  . calcium carbonate (OS-CAL) 600 MG TABS tablet Take 600 mg by mouth daily.  . Cholecalciferol (VITAMIN D) 50 MCG (2000 UT) CAPS Take 2,000 Units by mouth daily.  Marland Kitchen denosumab (PROLIA) 60 MG/ML SOSY injection  Inject 60 mg into the skin every 6 (six) months.  . furosemide (LASIX) 40 MG tablet Take 1 tablet (40 mg total) by mouth 2 (two) times daily.   Marland Kitchen guaiFENesin (MUCINEX) 600 MG 12 hr tablet Take 600 mg by mouth 2 (two) times daily.  Marland Kitchen loratadine (CLARITIN) 10 MG tablet Take 10 mg by mouth daily.  . Melatonin 5 MG TABS Take 10 mg by mouth at bedtime as needed (sleep).  . Multiple Vitamin (MULTIVITAMIN WITH MINERALS) TABS tablet Take 1 tablet by mouth daily.  . Niacin (VITAMIN B-3 PO) Take 1 tablet by mouth daily.  Marland Kitchen omeprazole (PRILOSEC) 20 MG capsule Take 30- 60 min before your first and last meals of the day (Patient taking differently: Take 20 mg by mouth daily as needed (acid reflux).)  . OXYGEN Inhale 2 L into the lungs daily. Use as directed at night time and when up and walking around.  . Tiotropium Bromide Monohydrate (SPIRIVA RESPIMAT) 2.5 MCG/ACT AERS Inhale 2 puffs into the lungs daily.  . [DISCONTINUED] diltiazem (CARDIZEM CD) 180 MG 24 hr capsule Take 1 capsule (180 mg total) by mouth daily.  . [DISCONTINUED] predniSONE (DELTASONE) 10 MG tablet Take 25 mg by mouth daily with breakfast.  . [DISCONTINUED] apixaban (ELIQUIS) 5 MG TABS tablet Take 1 tablet (5 mg total) by mouth 2 (two) times daily.  . [DISCONTINUED] azithromycin (ZITHROMAX) 250 MG tablet Take 250 mg by mouth See admin instructions. Start date : 12/23/20 Zpack   Allergies  Allergen Reactions  . Daliresp [Roflumilast]     Nausea, vomiting and diarrhea.  . Alendronate Other (See Comments)    Jittery/nervous   SDOH Screenings   Alcohol Screen: Low Risk   . Last Alcohol Screening Score (AUDIT): 1  Depression (PHQ2-9): Low Risk   . PHQ-2 Score: 0  Financial Resource Strain: Not on file  Food Insecurity: No Food Insecurity  . Worried About Charity fundraiser in the Last Year: Never true  . Ran Out of Food in the Last Year: Never true  Housing: Low Risk   . Last Housing Risk Score: 0  Physical Activity: Inactive  . Days of Exercise per Week: 0 days  . Minutes of Exercise per Session: 0 min  Social Connections: Not on file  Stress: Not on file  Tobacco  Use: Medium Risk  . Smoking Tobacco Use: Former Smoker  . Smokeless Tobacco Use: Never Used  Transportation Needs: No Transportation Needs  . Lack of Transportation (Medical): No  . Lack of Transportation (Non-Medical): No     Current Diagnosis/Assessment:  Goals Addressed            This Visit's Progress   . Pharmacy Care Plan       CARE PLAN ENTRY (see longitudinal plan of care for additional care plan information)  Current Barriers:  . Chronic Disease Management support, education, and care coordination needs related to Hyperlipidemia, Diabetes, Atrial Fibrillation, and COPD  Hyperlipidemia Lab Results  Component Value Date/Time   LDLCALC 90 11/20/2020 12:02 PM   . Pharmacist Clinical Goal(s): o Over the next 90 days, patient will work with PharmD and providers to maintain LDL goal < 100 . Current regimen:  o Atorvastatin 40 mg daily after supper  . Interventions: o Reviewed medication adherence and lab results.  o Discussed diet and exercise.  . Patient self care activities - Over the next 90 days, patient will: o Continue current medication.   Diabetes Lab Results  Component Value Date/Time  HGBA1C 6.3 (H) 11/20/2020 12:02 PM   HGBA1C 6.0 (H) 06/13/2020 10:02 AM   . Pharmacist Clinical Goal(s): o Over the next 90 days, patient will work with PharmD and providers to maintain A1c goal <7% . Current regimen:  o Diet and Lifestyle . Interventions: o Reviewed lifestyle and diet routine.  o Discussed benefits of blood sugar control.  . Patient self care activities - Over the next 90 days, patient will: o Contact provider with any problems or concerns   COPD . Pharmacist Clinical Goal(s) o Over the next 90 days, patient will work with PharmD and providers to minimize hospitalizations.  . Current regimen:  o Albuterol 108 mcg/act inhaler 2 puffs into the lungs every 6 hours prn for wheezing or shortness of breath o Albuterol 0.083% nebulizer solution bid  prn o Symbicort 160/4.5 mcg/act 2 puffs twice daily o Mucinex 600 mg bid o Loratadine 10 mg daily o Spiriva 2.5 mcg/act 2 puffs into the lungs daily o Prednisone adjusted due to symptoms . Interventions: o Pharmacist coordinating patient assistance for Symbicort and Spiriva.  o Reviewed medication regimen.  . Patient self care activities - Over the next 90 days, patient will: o Continue to work closely with pulmonology to reduce exacerbations.   Afib . Pharmacist Clinical Goal(s) o Over the next 90 days, patient will work with PharmD and providers to manage Afib . Current regimen:  o Bisoprolol 5 mg daily  o Diltiazem CD 120 mg daily  o Eliquis 5 mg bid  . Interventions: o Discussed Eliquis patient assistance. Patient's wife has prepared form for cardiology to sign and submit.  . Patient self care activities - Over the next 90 days, patient will: o Continue to take medication as prescribed.   Medication management . Pharmacist Clinical Goal(s): o Over the next 90 days, patient will work with PharmD and providers to maintain optimal medication adherence . Current pharmacy: [facility]  . Interventions o Comprehensive medication review performed. o Continue current medication management strategy . Patient self care activities - Over the next 90 days, patient will: o Focus on medication adherence by pill box o Take medications as prescribed o Report any questions or concerns to PharmD and/or provider(s)  Initial goal documentation        AFIB   Patient is currently rate controlled. HR 83 BPM  Patient has failed these meds in past: none reported Patient is currently controlled on the following medications:   Bisoprolol 5 mg daily   Diltiazem CD 120 mg daily   Eliquis 5 mg bid   We discussed:  Patient well controlled currently. Wife has application ready for cardiologist to complete for Eliquis patient assistance. Patient is renewing a previous application.    Plan  Continue current medications   Hyperlipidemia   LDL goal < 70  Last lipids Lab Results  Component Value Date   CHOL 172 11/20/2020   HDL 60 11/20/2020   LDLCALC 90 11/20/2020   TRIG 62 12/25/2020   CHOLHDL 2.9 11/20/2020   Hepatic Function Latest Ref Rng & Units 01/05/2021 12/28/2020 12/27/2020  Total Protein 6.5 - 8.1 g/dL 7.5 5.6(L) 5.9(L)  Albumin 3.5 - 5.0 g/dL 3.7 2.9(L) 2.9(L)  AST 15 - 41 U/L _0 ALT 0 - 44 U/L 39 32 31  Alk Phosphatase 38 - 126 U/L 62 45 44  Total Bilirubin 0.3 - 1.2 mg/dL 0.6 0.4 0.4  Bilirubin, Direct 0.0 - 0.2 mg/dL 0.2 - -     The  10-year ASCVD risk score Mikey Bussing DC Brooke Bonito., et al., 2013) is: 51.9%   Values used to calculate the score:     Age: 7 years     Sex: Male     Is Non-Hispanic African American: No     Diabetic: Yes     Tobacco smoker: No     Systolic Blood Pressure: 527 mmHg     Is BP treated: No     HDL Cholesterol: 60 mg/dL     Total Cholesterol: 172 mg/dL   Patient has failed these meds in past: none reported Patient is currently uncontrolled on the following medications:  . Atorvastatin 40 mg daily after supper  We discussed:  diet and exercise extensively. Patient eats healthy diet for the most part. When breathing is well controlled tries to go to gym and walk on treadmill.   Plan  Continue current medications   ,  COPD / Asthma / Tobacco   Gold Grade: Gold 3 (FEV1 30-49%)  Eosinophil count:   Lab Results  Component Value Date/Time   EOSPCT 0 12/27/2020 04:59 AM  %                               Eos (Absolute):  Lab Results  Component Value Date/Time   EOSABS 0.0 12/27/2020 04:59 AM   EOSABS 0.1 11/20/2020 12:02 PM    Tobacco Status:  Social History   Tobacco Use  Smoking Status Former Smoker  . Packs/day: 2.00  . Years: 52.00  . Pack years: 104.00  . Types: Cigarettes  . Quit date: 02/03/2009  . Years since quitting: 11.9  Smokeless Tobacco Never Used  Tobacco Comment   Counseled to remain  smoke free    Patient has failed these meds in past: none reported Patient is currently controlled on the following medications:   Albuterol 108 mcg/act inhaler 2 puffs into the lungs every 6 hours prn for wheezing or shortness of breath  Albuterol 0.083% nebulizer solution bid prn  Symbicort 160/4.5 mcg/act 2 puffs twice daily  Mucinex 600 mg bid  Loratadine 10 mg daily  Spiriva 2.5 mcg/act 2 puffs into the lungs daily  Prednisone adjusted due to symptoms  Using maintenance inhaler regularly? Yes Frequency of rescue inhaler use:  daily  We discussed:  proper inhaler technique. Patient managed closely with pulmonology to prevent exacerbations/hospitalizations. Patient is doing better per wife. Patient recently diagnosed with COVID but breathing seems to be stable during this.   Pharmacist completing patient assistance applications for Symbicort and Spiriva. Patient will provide income information and signature.   Plan  Continue current medications ,  Diabetes   Recent Relevant Labs: Lab Results  Component Value Date/Time   HGBA1C 6.3 (H) 11/20/2020 12:02 PM   HGBA1C 6.0 (H) 06/13/2020 10:02 AM     Patient has failed these meds in past: none reported Patient is currently controlled on the following medications: diet and lifestyle  We discussed: diet and exercise extensively. Patient is at the gym 4 days a week when breathing stable. Eats health diet.   Plan  Continue control with diet and exercise and   Heart Failure   Type: Diastolic  Office blood pressures are  BP Readings from Last 3 Encounters:  12/27/20 129/67  12/13/20 (!) 144/62  12/10/20 123/74   Last ejection fraction: 60-65%  Patient has failed these meds in past: none reported Patient is currently controlled on the following medications:  Bisoprolol 5 mg daily   Furosemide 40 mg bid   We discussed diet and exercise extensively. Patient well controlled currently per wife. Watches sodium  intake. Works to exercise when lungs allow.   Plan  Continue current medications   Acid Reflux   Patient has failed these meds in past: none reported Patient is currently controlled on the following medications:  . omeprazole 20 mg daily prn acid reflux  We discussed:  Patient treats acid reflux as needed to avoid a flare up with breathing/cough.   Plan  Continue current medications  Osteopenia / Osteoporosis   No results found for: VD25OH   Patient has failed these meds in past: none reported Patient is currently on the following medications:  . Prolia every 6 months  . Vitamin D 2000 units daily  . Calcium carbonate 600 mg daily   We discussed:  Recommend [phone] units of vitamin D daily. Recommend 1200 mg of calcium daily from dietary and supplemental sources. Recommend weight-bearing and muscle strengthening exercises for building and maintaining bone density.  Plan  Continue current medications   Health Maintenance   Patient is currently controlled on the following medications:  . Acetaminophen 650 mg every 6 hours prn mild pain/headache . Melatonin 10 mg qhs prn sleep  . Multiple vitamin daily  . Niacin daily  . Vitamin C daily   We discussed:  Patient well controlled per wife on current regimen. Patient takes steroids to reduce hospitalizations for exacerbations.   Plan  Continue current medications   Vaccines   Reviewed and discussed patient's vaccination history.    Immunization History  Administered Date(s) Administered  . Fluad Quad(high Dose 65+) 10/02/2020  . Influenza Split 09/22/2013, 09/01/2015  . Influenza Whole 09/04/2011, 09/21/2012  . Influenza, High Dose Seasonal PF 09/02/2016, 09/10/2017, 08/31/2018, 09/24/2018, 10/21/2019  . Influenza-Unspecified 08/23/2014  . Moderna Sars-Covid-2 Vaccination 01/17/2020, 02/14/2020, 10/06/2020  . Pneumococcal Conjugate-13 01/11/2014, 12/09/2016  . Pneumococcal Polysaccharide-23 08/23/2010,  08/29/2011  . Zoster Recombinat (Shingrix) 04/04/2014    Plan  Patient up to date on COVID and flu vaccine.   Medication Management   Patient's preferred pharmacy is:  [facility], [address] [place] [address] Phone: [phone] Fax: [phone]  [facility], [address] [place] [address] Phone: [phone] Fax: [phone]  Uses pill box? Yes Pt endorses good compliance  We discussed: Current pharmacy is preferred with insurance plan and patient is satisfied with pharmacy services  Plan  Continue current medication management strategy    Follow up: 1 month phone visit

## 2020-12-27 NOTE — TOC Progression Note (Addendum)
Transition of Care [facility]) - Progression Note    Patient Details  Name: Paul [name] MRN: [id number] Date of Birth: 03/23/1946  Transition of Care [facility]) CM/SW Contact  Darleene Cleaver, [place] Phone Number: 12/27/2020, 2:32 PM  Clinical Narrative:     CSW informed that patient is chronic on oxygen.  He has oxygen at home, once patient is medically ready for discharge, he will not need any new oxygen.    Expected Discharge Plan and Services    Plan to discharge back home patient does not have any PT needs.  PT has signed off.                                             Social Determinants of Health (SDOH) Interventions    Readmission Risk Interventions Readmission Risk Prevention Plan 04/05/2020 06/01/2019 05/06/2019  Transportation Screening Complete Complete Complete  PCP or Specialist Appt within 3-5 Days - Not Complete Complete  Not Complete comments - not ready for discharge  -  HRI or Home Care Consult - Complete Complete  Social Work Consult for Recovery Care Planning/Counseling - Complete Complete  Palliative Care Screening - Not Applicable Complete  Medication Review [employer]) Complete Complete Complete  PCP or Specialist appointment within 3-5 days of discharge Complete - -  HRI or Home Care Consult Complete - -  SW Recovery Care/Counseling Consult Complete - -  Palliative Care Screening Complete - -  Skilled Nursing Facility Not Applicable - -  Some recent data might be hidden

## 2020-12-27 NOTE — Progress Notes (Signed)
PROGRESS NOTE  Paul [name]  DOB: 07/20/1946  PCP: Blane Oharaox, Kirsten, MD [id number]  DOA: 12/24/2020  LOS: 2 days   Chief Complaint  Patient presents with  . Shortness of Breath    Covid +   Brief narrative: Paul [name] is a 75 y.o. male with PMH significant for DM2, HTN, HLD, A. fib, COPD on 2 L oxygen, anxiety. Patient presented to the ED on 12/24/20 with 2 days of fever up to 103, chills, cough, chest tightness, intermittent wheezing associated with shortness of breath. He had been doing well since recent discharge for COPD exacerbation on 12/10/2020 but developed symptoms abruptly and presented to ED due to their constant, progressive, severe nature.  In the ED, patient required more than 2 L oxygen to maintain oxygen saturation. Covid PCR positive.  Chest x-ray however did not show any infiltrates. CRP was elevated to 6.3 and later increased to 13.1 despite initiation of remdesivir and solumedrol.  Patient was admitted for further evaluation management. Of note patient is status post moderna vaccinations x3 (last 10/06/2020)  Subjective: Patient reports feeling better. Remains on O2 supplementation but with stable O2 sats. Reports that he uses O2 supplementation at night at home.  [facility]   Assessment/Plan: Breakthrough COVID infection Acute respiratory failure with hypoxia  -Presented with fever, shortness of breath -COVID test: PCR positive on admission -Chest imaging: Did not show any evidence of pneumonia  -Treatment: Currently on a 5-day course of IV remdesivir till 1/6.  On IV Solu-Medrol 50 mg 3 times daily.  -Progression: Improving.  On 2 L oxygen.  Taper down Solu-Medrol to 50 mg twice daily.  Continue remdesivir. -Supportive care: Vitamin C, Zinc, PRN inhalers, Tylenol, Antitussives (benzonatate/ Mucinex/Tussionex).   -Encouraged incentive spirometry, prone position, out of bed and early mobilization as much as possible -Continue  airborne/contact isolation precautions for duration of 3 weeks from the day of diagnosis. -WBC and inflammatory markers trend as below. 12/27/20 Will complete remdesivir treatment tomorrow. Will continue weaning off O2 supplementation during the day as tolerated.   Recent Labs  Lab 12/24/20 1025 12/24/20 1026 12/24/20 1106 12/24/20 1253 12/25/20 0949 12/26/20 0333 12/27/20 0459  SARSCOV2NAA  --   --  POSITIVE*  --   --   --   --   WBC 12.0*  --   --   --   --   --  13.4*  LATICACIDVEN  --  2.6*  --  1.3  --   --   --   PROCALCITON  --   --   --  <0.10 <0.10  --   --   DDIMER  --   --   --  2.21* 2.54*  --  2.19*  FERRITIN 214  --   --   --  313 325 311  LDH  --   --   --  175 201*  --   --   CRP 6.3*  --   --   --  13.1* 8.0* 3.8*  ALT 31  --   --   --   --  29 31    The treatment plan and use of medications and known side effects were discussed with patient/family. Some of the medications used are based on case reports/anecdotal data.  All other medications being used in the management of COVID-19 based on limited study data.  Complete risks and long-term side effects are unknown, however in the best clinical judgment they seem  to be of some benefit.  Patient wanted to proceed with treatment options provided.  COPD without exacerbation:  -Continue bronchodilators, avoiding nebs while hospitalized due to aerosolization risk.  -Follows up currently scheduled for 01/03/2021 with [place] [facility].  -Note home health palliative RN involvement as outpatient.  12/27/20 Will resume outpatient Palliative care at the time of discharge.   PAF, chronic HFpEF:  -Currently regular rhythm.  -Continue bisoprolol, diltiazem, eliquis, lasix po (does not appear overloaded currently 12/27/20 Without exacerbation. Will continue current treatment.   HLD:  - Continue statin  History of T2DM:  -Last HbA1c 6.3% on 11/29 -Continue sliding scale insulin with Accu-Cheks. Recent Labs  Lab 12/26/20 1114  12/26/20 1627 12/26/20 2139 12/27/20 0729 12/27/20 1138  GLUCAP 126* 137* 136* 149* 121*   Mobility: Encourage ambulation.  PT eval, patient has no further PT needs.  Code Status:   Code Status: Full Code  Nutritional status: Body mass index is 25.66 kg/m.     Diet Order            Diet Carb Modified Fluid consistency: Thin; Room service appropriate? Yes  Diet effective now                 DVT prophylaxis:  apixaban (ELIQUIS) tablet 5 mg   Antimicrobials:  None Fluid: None Consultants: None Family Communication:  Not at bedside  Status is: Inpatient  Remains inpatient appropriate because: Ongoing Covid treatment   Dispo: The patient is from: Home              Anticipated d/c is to: Home              Anticipated d/c date is: 1 day              Patient currently is not medically stable to d/c.       Infusions:  . sodium chloride Stopped (12/25/20 1108)  . remdesivir 100 mg in NS 100 mL 100 mg (12/27/20 0906)    Scheduled Meds: . apixaban  5 mg Oral BID  . vitamin C  500 mg Oral Daily  . atorvastatin  40 mg Oral Daily  . bisoprolol  5 mg Oral Daily  . cholecalciferol  1,000 Units Oral Daily  . diltiazem  120 mg Oral Daily  . furosemide  40 mg Oral BID  . insulin aspart  0-9 Units Subcutaneous TID WC  . loratadine  10 mg Oral Daily  . methylPREDNISolone (SOLU-MEDROL) injection  50 mg Intravenous Q12H  . mometasone-formoterol  2 puff Inhalation BID  . sodium chloride flush  3 mL Intravenous Q12H  . umeclidinium bromide  1 puff Inhalation Daily  . zinc sulfate  220 mg Oral Daily    Antimicrobials: Anti-infectives (From admission, onward)   Start     Dose/Rate Route Frequency Ordered Stop   12/25/20 1000  remdesivir 100 mg in sodium chloride 0.9 % 100 mL IVPB  Status:  Discontinued       "Followed by" Linked Group Details   100 mg 200 mL/hr over 30 Minutes Intravenous Daily 12/24/20 1336 12/24/20 1346   12/25/20 1000  remdesivir 100 mg in sodium  chloride 0.9 % 100 mL IVPB       "Followed by" Linked Group Details   100 mg 200 mL/hr over 30 Minutes Intravenous Daily 12/24/20 1346 12/29/20 0959   12/25/20 0926  remdesivir 100 mg injection       Note to Pharmacy: Lovell Sheehan: cabinet override  12/25/20 0926 12/25/20 1005   12/24/20 1430  remdesivir 100 mg in sodium chloride 0.9 % 100 mL IVPB       "Followed by" Linked Group Details   100 mg 200 mL/hr over 30 Minutes Intravenous Once 12/24/20 1346 12/24/20 1607   12/24/20 1400  remdesivir 100 mg in sodium chloride 0.9 % 100 mL IVPB       "Followed by" Linked Group Details   100 mg 200 mL/hr over 30 Minutes Intravenous Once 12/24/20 1346 12/24/20 1521   12/24/20 1345  remdesivir 200 mg in sodium chloride 0.9% 250 mL IVPB  Status:  Discontinued       "Followed by" Linked Group Details   200 mg 580 mL/hr over 30 Minutes Intravenous Once 12/24/20 1336 12/24/20 1346      PRN meds: sodium chloride, acetaminophen, albuterol, melatonin   Objective: Vitals:   12/27/20 0412 12/27/20 1409  BP: 129/67 132/68  Pulse: 76 77  Resp: 18 18  Temp: 98.2 F (36.8 C) 98.6 F (37 C)  SpO2: 95% 94%    Intake/Output Summary (Last 24 hours) at 12/27/2020 1654 Last data filed at 12/27/2020 1515 Gross per 24 hour  Intake 1181 ml  Output 1400 ml  Net -219 ml   Filed Weights   12/24/20 0953 12/24/20 1013 12/25/20 2300  Weight: 69.4 kg 72.1 kg 72.1 kg   Weight change:  Body mass index is 25.66 kg/m.   Physical Exam: General exam: Sitting up in bed, in NAD.  Skin: No rashes, lesions or ulcers. HEENT: Atraumatic, normocephalic Lungs: No cough, normal rate CVS: RRR, S1 and S2 present GI/Abd soft, nontender, nondistended CNS: Alert, awake, oriented x3 Psychiatry: Mood appropriate, affect congruent Extremities: No pedal edema, no cyanosis  Data Review: I have personally reviewed the laboratory data and studies available.  Recent Labs  Lab 12/24/20 1025 12/27/20 0459   WBC 12.0* 13.4*  NEUTROABS 10.6* 12.3*  HGB 12.8* 11.8*  HCT 41.8 39.0  MCV 80.7 82.1  PLT 223 221   Recent Labs  Lab 12/24/20 1025 12/26/20 0333 12/27/20 0459  NA 139 142 140  K 4.0 3.9 4.6  CL 99 104 100  CO2 30 30 28   GLUCOSE 159* 161* 157*  BUN 19 20 18   CREATININE 1.00 0.64 0.63  CALCIUM 8.9 8.1* 7.9*      Signed, Blain Pais, MD [facility] 12/27/2020

## 2020-12-27 NOTE — Plan of Care (Signed)
  Problem: Education: Goal: Knowledge of General Education information will improve Description: Including pain rating scale, medication(s)/side effects and non-pharmacologic comfort measures Outcome: Progressing   Problem: Clinical Measurements: Goal: Ability to maintain clinical measurements within normal limits will improve Outcome: Progressing   Problem: Activity: Goal: Risk for activity intolerance will decrease Outcome: Progressing   Problem: Pain Managment: Goal: General experience of comfort will improve Outcome: Progressing   

## 2020-12-28 ENCOUNTER — Telehealth: Payer: Self-pay | Admitting: Cardiology

## 2020-12-28 ENCOUNTER — Telehealth: Payer: Self-pay

## 2020-12-28 DIAGNOSIS — J9621 Acute and chronic respiratory failure with hypoxia: Secondary | ICD-10-CM | POA: Diagnosis not present

## 2020-12-28 DIAGNOSIS — I5032 Chronic diastolic (congestive) heart failure: Secondary | ICD-10-CM | POA: Diagnosis not present

## 2020-12-28 LAB — COMPREHENSIVE METABOLIC PANEL
ALT: 32 U/L (ref 0–44)
AST: 23 U/L (ref 15–41)
Albumin: 2.9 g/dL — ABNORMAL LOW (ref 3.5–5.0)
Alkaline Phosphatase: 45 U/L (ref 38–126)
Anion gap: 7 (ref 5–15)
BUN: 27 mg/dL — ABNORMAL HIGH (ref 8–23)
CO2: 32 mmol/L (ref 22–32)
Calcium: 7.7 mg/dL — ABNORMAL LOW (ref 8.9–10.3)
Chloride: 101 mmol/L (ref 98–111)
Creatinine, Ser: 0.76 mg/dL (ref 0.61–1.24)
GFR, Estimated: >60 mL/min (ref >60)
Glucose, Bld: 168 mg/dL — ABNORMAL HIGH (ref 70–99)
Potassium: 4.4 mmol/L (ref 3.5–5.1)
Sodium: 140 mmol/L (ref 135–145)
Total Bilirubin: 0.4 mg/dL (ref 0.3–1.2)
Total Protein: 5.6 g/dL — ABNORMAL LOW (ref 6.5–8.1)

## 2020-12-28 LAB — GLUCOSE, CAPILLARY
Glucose-Capillary: 158 mg/dL — ABNORMAL HIGH (ref 70–99)
Glucose-Capillary: 131 mg/dL — ABNORMAL HIGH (ref 70–99)

## 2020-12-28 LAB — D-DIMER, QUANTITATIVE: D-Dimer, Quant: 2.33 ug/mL-FEU — ABNORMAL HIGH (ref 0.00–0.50)

## 2020-12-28 LAB — C-REACTIVE PROTEIN: CRP: 2.2 mg/dL — ABNORMAL HIGH (ref <1.0)

## 2020-12-28 LAB — FERRITIN: Ferritin: 307 ng/mL (ref 24–336)

## 2020-12-28 MED ORDER — ACETAMINOPHEN 325 MG PO TABS: 650.0000 mg | ORAL_TABLET | Freq: Four times a day (QID) | ORAL | Status: DC | PRN

## 2020-12-28 MED ORDER — BENZONATATE 100 MG PO CAPS: 200.0000 mg | ORAL_CAPSULE | Freq: Three times a day (TID) | ORAL | 0 refills | Status: AC | PRN

## 2020-12-28 MED ORDER — DILTIAZEM HCL ER COATED BEADS 120 MG PO CP24: 120.0000 mg | ORAL_CAPSULE | Freq: Every day | ORAL | 0 refills | Status: DC

## 2020-12-28 MED ORDER — ASCORBIC ACID 500 MG PO TABS: 500.0000 mg | ORAL_TABLET | Freq: Every day | ORAL | Status: DC

## 2020-12-28 MED ORDER — PREDNISONE 10 MG PO TABS: ORAL_TABLET | ORAL | 0 refills | Status: DC

## 2020-12-28 NOTE — Consult Note (Signed)
   [facility] Inpatient Consult   12/28/2020  Maurilio Puryear 03/09/1946 [id number]   Patient chart reviewed for potential [facility] Care Management [facility] CM) services due to high unplanned readmission risk score, 34%.  Per chart review, patient has chronic care management services through primary care physician practice, [facility]. Primary provider office provides transition of care follow up. No identifiable [facility] CM service needs.  Of note, [facility] Care Management services does not replace or interfere with any services that are arranged by inpatient case management or social work.  Christophe Louis, MSN, RN [facility] Liaison Nurse Mobile Phone [phone]  Toll free office [phone]

## 2020-12-28 NOTE — Discharge Summary (Addendum)
Physician Discharge Summary  Paul [name] [id number] DOB: 02/13/1946 DOA: 12/24/2020  PCP: [name], MD  Admit date: 12/24/2020 Discharge date: 12/28/2020  Admitted From: Home    Disposition:  Home   Recommendations for Outpatient Follow-up:  1. Follow up with PCP in 1 week   Home Health:No (patient was seen by PT with recommendation for no further PT). Equipment/Devices: O2, 2lpm.  Discharge Condition: Stable CODE STATUS: Full Diet recommendation: Carb Mod Brief/Interim Summary: He reports feeling much better but still has an occasional cough. He is requiring continued O2 supplementation of 2lpm via [place]. He reports already having O2 supplementation at home, was using it at night pta but will start using it during the day.  He will have a prednisone taper but will follow up with his Pulmonologist so they can decide on extending the taper if needed. His blood sugar is improving, he will follow up with his PCP for continue diabetes monitoring.   Please see below for further details for this hospital course:   Breakthrough COVID infection Acute respiratory failure with hypoxia  -Presented with fever, shortness of breath -COVID test: PCR positive on admission -Chest imaging: Did not show any evidence of pneumonia  -Treatment: Currently on a 5-day course of IV remdesivir till 1/6.  On IV Solu-Medrol 50 mg 3 times daily.  -Progression: Improving.  On 2 L oxygen.  Taper down Solu-Medrol to 50 mg twice daily.  Continue remdesivir. -Supportive care: Vitamin C, Zinc, PRN inhalers, Tylenol, Antitussives (benzonatate/ Mucinex/Tussionex).   -Encouraged incentive spirometry, prone position, out of bed and early mobilization as much as possible -Continue airborne/contact isolation precautions for duration of 3 weeks from the day of diagnosis. -WBC and inflammatory markers trend as below. 12/27/20 Will complete remdesivir treatment tomorrow. Will continue weaning off O2 supplementation during the  day as tolerated.   Last Labs            Recent Labs  Lab 12/24/20 1025 12/24/20 1026 12/24/20 1106 12/24/20 1253 12/25/20 0949 12/26/20 0333 12/27/20 0459  SARSCOV2NAA  --   --  POSITIVE*  --   --   --   --   WBC 12.0*  --   --   --   --   --  13.4*  LATICACIDVEN  --  2.6*  --  1.3  --   --   --   PROCALCITON  --   --   --  <0.10 <0.10  --   --   DDIMER  --   --   --  2.21* 2.54*  --  2.19*  FERRITIN 214  --   --   --  313 325 311  LDH  --   --   --  175 201*  --   --   CRP 6.3*  --   --   --  13.1* 8.0* 3.8*  ALT 31  --   --   --   --  29 31      The treatment plan and use of medications and known side effects were discussed with patient/family. Some of the medications used are based on case reports/anecdotal data. All other medications being used in the management of COVID-19 based on limited study data. Complete risks and long-term side effects are unknown, however in the best clinical judgment they seem to be of some benefit. Patientwanted to proceed with treatment options provided.  COPD without exacerbation: -Continue bronchodilators, avoiding nebs while hospitalized due to aerosolization risk.  -Follows up currently  scheduled for 01/03/2021 with [facility].  -Note home health palliative RN involvement as outpatient.  12/27/20 Will resume outpatient Palliative care at the time of discharge.   PAF, chronic HFpEF:  -Currently regular rhythm.  -Continue bisoprolol, diltiazem, eliquis, lasix po (does not appear overloaded currently 12/27/20 Without exacerbation. Will continue current treatment.   HLD:  - Continue statin  History of T2DM:  -Last HbA1c 6.3% on 11/29 -Continue sliding scale insulin with Accu-Cheks. Last Labs          Recent Labs  Lab 12/26/20 1114 12/26/20 1627 12/26/20 2139 12/27/20 0729 12/27/20 1138  GLUCAP 126* 137* 136* 149* 121*     Mobility: Encourage ambulation.  PT eval, patient has no further PT needs.  Code Status:   Code Status: Full Code  Nutritional status: Body mass index is 25.66 kg/m.  Discharge Diagnoses:  Active Problems:   COPD GOLD  III    Paroxysmal atrial fibrillation (HCC)   Acute on chronic respiratory failure with hypoxia (HCC)   HLD (hyperlipidemia)   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HCC)   Essential hypertension   Chronic diastolic CHF (congestive heart failure) ([place])   COVID-19    Discharge Instructions  Discharge Instructions    Diet Carb Modified   Complete by: As directed    Increase activity slowly   Complete by: As directed      Allergies as of 12/28/2020      Reactions   Daliresp [roflumilast]    Nausea, vomiting and diarrhea.   Alendronate Other (See Comments)   Jittery/nervous      Medication List    STOP taking these medications   azithromycin 250 MG tablet Commonly known as: ZITHROMAX     TAKE these medications   acetaminophen 325 MG tablet Commonly known as: TYLENOL Take 2 tablets (650 mg total) by mouth every 6 (six) hours as needed for mild pain or headache (fever >/= 101).   apixaban 5 MG Tabs tablet Commonly known as: ELIQUIS Take 1 tablet (5 mg total) by mouth 2 (two) times daily.   ascorbic acid 500 MG tablet Commonly known as: VITAMIN C Take 1 tablet (500 mg total) by mouth daily. Start taking on: December 29, 2020   atorvastatin 40 MG tablet Commonly known as: LIPITOR TAKE 1 TABLET(40 MG) BY MOUTH DAILY AFTER SUPPER What changed:   how much to take  how to take this  when to take this  additional instructions   benzonatate 100 MG capsule Commonly known as: Tessalon Perles Take 2 capsules (200 mg total) by mouth 3 (three) times daily as needed for up to 5 days for cough.   bisoprolol 5 MG tablet Commonly known as: ZEBETA Take 1 tablet (5 mg total) by mouth daily.   budesonide-formoterol 160-4.5 MCG/ACT inhaler Commonly known as: SYMBICORT Inhale 2 puffs into the lungs 2 (two) times daily.   calcium  carbonate 600 MG Tabs tablet Commonly known as: OS-CAL Take 600 mg by mouth daily.   denosumab 60 MG/ML Sosy injection Commonly known as: PROLIA Inject 60 mg into the skin every 6 (six) months.   diltiazem 120 MG 24 hr capsule Commonly known as: CARDIZEM CD Take 1 capsule (120 mg total) by mouth daily. Start taking on: December 29, 2020 What changed:   medication strength  how much to take   furosemide 40 MG tablet Commonly known as: LASIX Take 1 tablet (40 mg total) by mouth 2 (two) times daily.   guaiFENesin 600 MG  12 hr tablet Commonly known as: MUCINEX Take 600 mg by mouth 2 (two) times daily.   loratadine 10 MG tablet Commonly known as: CLARITIN Take 10 mg by mouth daily.   melatonin 5 MG Tabs Take 10 mg by mouth at bedtime as needed (sleep).   multivitamin with minerals Tabs tablet Take 1 tablet by mouth daily.   omeprazole 20 MG capsule Commonly known as: PRILOSEC Take 30- 60 min before your first and last meals of the day What changed:   how much to take  how to take this  when to take this  reasons to take this  additional instructions   OXYGEN Inhale 2 L into the lungs daily. Use as directed at night time and when up and walking around.   predniSONE 10 MG tablet Commonly known as: DELTASONE Take 4 tablets (40 mg total) by mouth daily with breakfast for 3 days, THEN 3 tablets (30 mg total) daily with breakfast for 3 days, THEN 2 tablets (20 mg total) daily with breakfast for 3 days, THEN 1 tablet (10 mg total) daily with breakfast for 3 days. Start taking on: December 28, 2020 What changed: See the new instructions.   ProAir HFA 108 (90 Base) MCG/ACT inhaler Generic drug: albuterol Inhale 2 puffs into the lungs every 6 (six) hours as needed for wheezing or shortness of breath. What changed: Another medication with the same name was changed. Make sure you understand how and when to take each.   albuterol (2.5 MG/3ML) 0.083% nebulizer  solution Commonly known as: PROVENTIL Take 3 mLs (2.5 mg total) by nebulization in the morning and at bedtime. What changed:   when to take this  reasons to take this   Spiriva Respimat 2.5 MCG/ACT Aers Generic drug: Tiotropium Bromide Monohydrate Inhale 2 puffs into the lungs daily.   VITAMIN B-3 PO Take 1 tablet by mouth daily.   Vitamin D 50 MCG (2000 UT) Caps Take 2,000 Units by mouth daily.       Follow-up Information    Cox, Kirsten, MD. Schedule an appointment as soon as possible for a visit in 1 week(s).   Specialties: Family Medicine, Interventional Cardiology, Radiology, Anesthesiology Contact information: [address] [place] [address] [phone]        Jerline Pain, MD .   Specialty: Cardiology Contact information: [phone] [address] [place] [address] [phone]              Allergies  Allergen Reactions  . Daliresp [Roflumilast]     Nausea, vomiting and diarrhea.  . Alendronate Other (See Comments)    Jittery/nervous    Consultations:  None   Procedures/Studies: DG Chest Port 1 View  Result Date: 12/24/2020 CLINICAL DATA:  Chest pain, cough and fever. EXAM: PORTABLE CHEST 1 VIEW COMPARISON:  12/07/2020 FINDINGS: The cardiac silhouette, mediastinal and hilar contours are within normal limits and stable. There is mild tortuosity and calcification the thoracic aorta. Stable underlying emphysematous changes and hyperinflation. No acute overlying pulmonary process. No pleural effusions or pulmonary lesions. The bony thorax is intact. IMPRESSION: Emphysematous changes but no acute overlying pulmonary process. Electronically Signed   By: Marijo Sanes M.D.   On: 12/24/2020 10:56   DG Chest Port 1 View  Result Date: 12/08/2020 CLINICAL DATA:  Shortness of breath EXAM: PORTABLE CHEST 1 VIEW COMPARISON:  12/02/2020 FINDINGS: There is hyperinflation of the lungs compatible with COPD. Heart and mediastinal  contours are within normal limits.  No focal opacities or effusions. No acute bony abnormality. Aortic atherosclerosis IMPRESSION: COPD.  No active disease. Electronically Signed   By: Rolm Baptise M.D.   On: 12/08/2020 00:08   DG Chest Portable 1 View  Result Date: 12/02/2020 CLINICAL DATA:  Increased shortness of breath, history of COPD. EXAM: PORTABLE CHEST 1 VIEW COMPARISON:  Chest x-ray 04/01/2020 FINDINGS: The heart size and mediastinal contours are within normal limits. Aortic arch calcification. Flattening of the hemidiaphragms with hyperinflation of the lungs consistent with emphysematous changes. No focal consolidation. No pulmonary edema. No pleural effusion. No pneumothorax. No acute osseous abnormality. IMPRESSION: No active disease. Electronically Signed   By: Iven Finn M.D.   On: 12/02/2020 17:04       Subjective: He finished his last remdesivir this morning and reports feeling well without rash. He has an occasional dry cough. His O2 sats are stable with 2lpm via [place]. He is eager to go home.   Discharge Exam: Vitals:   12/27/20 1951 12/28/20 0444  BP: 127/70 129/67  Pulse: 75 70  Resp: 18 18  Temp: 98.4 F (36.9 C) 98 F (36.7 C)  SpO2: 97% 98%   Vitals:   12/27/20 0412 12/27/20 1409 12/27/20 1951 12/28/20 0444  BP: 129/67 132/68 127/70 129/67  Pulse: 76 77 75 70  Resp: 18 18 18 18   Temp: 98.2 F (36.8 C) 98.6 F (37 C) 98.4 F (36.9 C) 98 F (36.7 C)  TempSrc: Oral Oral    SpO2: 95% 94% 97% 98%  Weight:      Height:        General: Pt is alert, awake, not in acute distress Cardiovascular: RRR, S1/S2 +, no rubs, no gallops Respiratory: CTA bilaterally, no wheezing, no rhonchi Abdominal: Soft, NT, ND, bowel sounds + Extremities: no edema, no cyanosis    The results of significant diagnostics from this hospitalization (including imaging, microbiology, ancillary and laboratory) are listed below for reference.     Microbiology: Recent Results (from  the past 240 hour(s))  Culture, blood (Routine x 2)     Status: None (Preliminary result)   Collection Time: 12/24/20 10:20 AM   Specimen: BLOOD RIGHT FOREARM  Result Value Ref Range Status   Specimen Description   Final    BLOOD RIGHT FOREARM Performed at [facility], [address] [place], [place] [address]    Special Requests   Final    BOTTLES DRAWN AEROBIC AND ANAEROBIC Blood Culture adequate volume Performed at [facility], [address] [place], [place] [address]    Culture   Final    NO GROWTH 4 DAYS Performed at [facility], [place] [address] [place], [place] [address]    Report Status PENDING  Incomplete  Culture, blood (Routine x 2)     Status: None (Preliminary result)   Collection Time: 12/24/20 11:00 AM   Specimen: BLOOD  Result Value Ref Range Status   Specimen Description   Final    BLOOD BLOOD LEFT FOREARM Performed at [facility], [address] [place], [place] [address]    Special Requests   Final    BOTTLES DRAWN AEROBIC AND ANAEROBIC Blood Culture adequate volume Performed at [facility], [address] [place], [place] [address]    Culture   Final    NO GROWTH 4 DAYS Performed at [facility], [place] [address], [place], [place] [address]    Report  Status PENDING  Incomplete  Resp Panel by RT-PCR (Flu A&B, Covid) Nasopharyngeal Swab     Status: Abnormal   Collection Time: 12/24/20 11:06 AM   Specimen: Nasopharyngeal Swab; Nasopharyngeal(NP) swabs in vial transport medium  Result Value Ref Range Status   SARS Coronavirus 2 by RT PCR POSITIVE (A) NEGATIVE Final    Comment: RESULT CALLED TO, READ BACK BY AND VERIFIED WITH: C REED,RN AT 1159 12/24/20 BY K BARR (NOTE) SARS-CoV-2 target nucleic acids are DETECTED.  The SARS-CoV-2 RNA is generally detectable in upper respiratory specimens during the acute phase of infection. Positive results are indicative of the presence of the identified  virus, but do not rule out bacterial infection or co-infection with other pathogens not detected by the test. Clinical correlation with patient history and other diagnostic information is necessary to determine patient infection status. The expected result is Negative.  Fact Sheet for Patients: EntrepreneurPulse.com.au  Fact Sheet for Healthcare Providers: IncredibleEmployment.be  This test is not yet approved or cleared by the [place] FDA and  has been authorized for detection and/or diagnosis of SARS-CoV-2 by FDA under an Emergency Use Authorization (EUA).  This EUA will remain in effect (meaning this test can b e used) for the duration of  the COVID-19 declaration under Section 564(b)(1) of the Act, 21 U.S.C. section 360bbb-3(b)(1), unless the authorization is terminated or revoked sooner.     Influenza A by PCR NEGATIVE NEGATIVE Final   Influenza B by PCR NEGATIVE NEGATIVE Final    Comment: (NOTE) The Xpert Xpress SARS-CoV-2/FLU/RSV plus assay is intended as an aid in the diagnosis of influenza from Nasopharyngeal swab specimens and should not be used as a sole basis for treatment. Nasal washings and aspirates are unacceptable for Xpert Xpress SARS-CoV-2/FLU/RSV testing.  Fact Sheet for Patients: EntrepreneurPulse.com.au  Fact Sheet for Healthcare Providers: IncredibleEmployment.be  This test is not yet approved or cleared by the [place] FDA and has been authorized for detection and/or diagnosis of SARS-CoV-2 by FDA under an Emergency Use Authorization (EUA). This EUA will remain in effect (meaning this test can be used) for the duration of the COVID-19 declaration under Section 564(b)(1) of the Act, 21 U.S.C. section 360bbb-3(b)(1), unless the authorization is terminated or revoked.  Performed at [facility], [address] [place], [place] [address]      Labs: BNP  (last 3 results) Recent Labs    02/02/20 1200 02/16/20 1726 03/04/20 1220  BNP 87.4 88.9 [id number]   Basic Metabolic Panel: Recent Labs  Lab 12/24/20 1025 12/26/20 0333 12/27/20 0459 12/28/20 0435  NA 139 142 140 140  K 4.0 3.9 4.6 4.4  CL 99 104 100 101  CO2 30 30 28  32  GLUCOSE 159* 161* 157* 168*  BUN 19 20 18  27*  CREATININE 1.00 0.64 0.63 0.76  CALCIUM 8.9 8.1* 7.9* 7.7*   Liver Function Tests: Recent Labs  Lab 12/24/20 1025 12/26/20 0333 12/27/20 0459 12/28/20 0435  AST 27 20 25 23   ALT 31 29 31  32  ALKPHOS 58 57 44 45  BILITOT 0.6 0.4 0.4 0.4  PROT 6.8 5.9* 5.9* 5.6*  ALBUMIN 3.4* 2.9* 2.9* 2.9*   No results for input(s): LIPASE, AMYLASE in the last 168 hours. No results for input(s): AMMONIA in the last 168 hours. CBC: Recent Labs  Lab 12/24/20 1025 12/27/20 0459  WBC 12.0* 13.4*  NEUTROABS 10.6* 12.3*  HGB 12.8* 11.8*  HCT 41.8 39.0  MCV 80.7 82.1  PLT 223  221   Cardiac Enzymes: No results for input(s): CKTOTAL, CKMB, CKMBINDEX, TROPONINI in the last 168 hours. BNP: Invalid input(s): POCBNP CBG: Recent Labs  Lab 12/27/20 1138 12/27/20 1736 12/27/20 2128 12/28/20 0737 12/28/20 1135  GLUCAP 121* 134* 174* 158* 131*   D-Dimer Recent Labs    12/27/20 0459 12/28/20 0435  DDIMER 2.19* 2.33*   Hgb A1c No results for input(s): HGBA1C in the last 72 hours. Lipid Profile No results for input(s): CHOL, HDL, LDLCALC, TRIG, CHOLHDL, LDLDIRECT in the last 72 hours. Thyroid function studies No results for input(s): TSH, T4TOTAL, T3FREE, THYROIDAB in the last 72 hours.  Invalid input(s): FREET3 Anemia work up Recent Labs    12/27/20 0459 12/28/20 0435  FERRITIN 311 307   Urinalysis    Component Value Date/Time   COLORURINE YELLOW 12/24/2020 1437   APPEARANCEUR CLEAR 12/24/2020 1437   LABSPEC 1.015 12/24/2020 1437   PHURINE 7.0 12/24/2020 1437   GLUCOSEU NEGATIVE 12/24/2020 1437   HGBUR NEGATIVE 12/24/2020 1437   BILIRUBINUR NEGATIVE  12/24/2020 1437   KETONESUR NEGATIVE 12/24/2020 1437   PROTEINUR NEGATIVE 12/24/2020 1437   NITRITE NEGATIVE 12/24/2020 1437   LEUKOCYTESUR NEGATIVE 12/24/2020 1437   Sepsis Labs Invalid input(s): PROCALCITONIN,  WBC,  LACTICIDVEN Microbiology Recent Results (from the past 240 hour(s))  Culture, blood (Routine x 2)     Status: None (Preliminary result)   Collection Time: 12/24/20 10:20 AM   Specimen: BLOOD RIGHT FOREARM  Result Value Ref Range Status   Specimen Description   Final    BLOOD RIGHT FOREARM Performed at [facility], [address] [place], [place] [address]    Special Requests   Final    BOTTLES DRAWN AEROBIC AND ANAEROBIC Blood Culture adequate volume Performed at [facility], [address] [place], [place] [address]    Culture   Final    NO GROWTH 4 DAYS Performed at [facility], [address] [place], [place] [address]    Report Status PENDING  Incomplete  Culture, blood (Routine x 2)     Status: None (Preliminary result)   Collection Time: 12/24/20 11:00 AM   Specimen: BLOOD  Result Value Ref Range Status   Specimen Description   Final    BLOOD BLOOD LEFT FOREARM Performed at [facility], [address] [place], [place] [address]    Special Requests   Final    BOTTLES DRAWN AEROBIC AND ANAEROBIC Blood Culture adequate volume Performed at [facility], [address] [place], [place] [address]    Culture   Final    NO GROWTH 4 DAYS Performed at [facility], [address], [place], [place] [address]    Report Status PENDING  Incomplete  Resp Panel by RT-PCR (Flu A&B, Covid) Nasopharyngeal Swab     Status: Abnormal   Collection Time: 12/24/20 11:06 AM   Specimen: Nasopharyngeal Swab; Nasopharyngeal(NP) swabs in vial transport medium  Result Value Ref Range Status   SARS Coronavirus 2 by RT PCR POSITIVE (A) NEGATIVE Final    Comment: RESULT CALLED TO, READ BACK BY AND VERIFIED WITH: C  REED,RN AT 1159 12/24/20 BY K BARR (NOTE) SARS-CoV-2 target nucleic acids are DETECTED.  The SARS-CoV-2 RNA is generally detectable in upper respiratory specimens during the acute phase of infection. Positive results are indicative of the presence of the identified virus, but do not rule out bacterial infection or co-infection with other pathogens  not detected by the test. Clinical correlation with patient history and other diagnostic information is necessary to determine patient infection status. The expected result is Negative.  Fact Sheet for Patients: EntrepreneurPulse.com.au  Fact Sheet for Healthcare Providers: IncredibleEmployment.be  This test is not yet approved or cleared by the [place] FDA and  has been authorized for detection and/or diagnosis of SARS-CoV-2 by FDA under an Emergency Use Authorization (EUA).  This EUA will remain in effect (meaning this test can b e used) for the duration of  the COVID-19 declaration under Section 564(b)(1) of the Act, 21 U.S.C. section 360bbb-3(b)(1), unless the authorization is terminated or revoked sooner.     Influenza A by PCR NEGATIVE NEGATIVE Final   Influenza B by PCR NEGATIVE NEGATIVE Final    Comment: (NOTE) The Xpert Xpress SARS-CoV-2/FLU/RSV plus assay is intended as an aid in the diagnosis of influenza from Nasopharyngeal swab specimens and should not be used as a sole basis for treatment. Nasal washings and aspirates are unacceptable for Xpert Xpress SARS-CoV-2/FLU/RSV testing.  Fact Sheet for Patients: EntrepreneurPulse.com.au  Fact Sheet for Healthcare Providers: IncredibleEmployment.be  This test is not yet approved or cleared by the [place] FDA and has been authorized for detection and/or diagnosis of SARS-CoV-2 by FDA under an Emergency Use Authorization (EUA). This EUA will remain in effect (meaning this test can be used) for the  duration of the COVID-19 declaration under Section 564(b)(1) of the Act, 21 U.S.C. section 360bbb-3(b)(1), unless the authorization is terminated or revoked.  Performed at [facility], [address], [place], [place] [address]      Time coordinating discharge: Over 33 minutes  SIGNED:   Blain Pais, MD  [facility] 12/28/2020, 12:07 PM Pager   If 7PM-7AM, please contact night-coverage [email or web address] Password [email or web address]

## 2020-12-28 NOTE — Care Management Important Message (Signed)
Important Message  Patient Details IM Letter given to the Patient. Name: Paul [name] MRN: [id number] Date of Birth: 25-Jan-1946   Medicare Important Message Given:  Yes     Caren Macadam 12/28/2020, 11:49 AM

## 2020-12-28 NOTE — Telephone Encounter (Signed)
I am fine with him decreasing it to 120 mg daily. In follow-up, we will be able to see how his heart rate is.  Donato Schultz, MD

## 2020-12-28 NOTE — Telephone Encounter (Signed)
[name] aware OK to decrease Diltiazem to 120 mg daily.  P/h appt scheduled for f/u.

## 2020-12-28 NOTE — Telephone Encounter (Signed)
Pt has been taking Diltiazem 180 mg daily per Dr Anne Fu for a long time now.  When pt was d/ced from the hospital, he was instructed to decrease it to 120 mg daily.  Wife is calling to make sure this is OK with Dr Anne Fu before she makes the change.  Aware I will have him review and call her back with instructions.

## 2020-12-28 NOTE — Telephone Encounter (Signed)
New message:    Patient wife calling concerning some medications that the  Hospital wanted him to take and they have some questions concering the changes. Please call patient wife.

## 2020-12-28 NOTE — Progress Notes (Signed)
Chronic Care Management Pharmacy Assistant   Name: Paul [name]  MRN: [id number] DOB: Oct 22, 1946  Reason for Encounter:  Coordination of PAP forms for Symbicort and Spiriva  At the request of Lucia Gaskins, CPP, patient assistance forms were completed for Symbicort and Spiriva medications.  The forms were completed and sent to Lucia Gaskins, CPP for signatures and approval.  Per Lucia Gaskins, CPP, the patients wife will come to the office on 12/29/20 with proof of income and complete the forms.     PCP : Blane Ohara, MD  Allergies:   Allergies  Allergen Reactions  . Daliresp [Roflumilast]     Nausea, vomiting and diarrhea.  . Alendronate Other (See Comments)    Jittery/nervous    Medications: Facility-Administered Encounter Medications as of 12/28/2020  Medication  . 0.9 %  sodium chloride infusion  . acetaminophen (TYLENOL) tablet 650 mg  . albuterol (VENTOLIN HFA) 108 (90 Base) MCG/ACT inhaler 2 puff  . apixaban (ELIQUIS) tablet 5 mg  . ascorbic acid (VITAMIN C) tablet 500 mg  . atorvastatin (LIPITOR) tablet 40 mg  . bisoprolol (ZEBETA) tablet 5 mg  . cholecalciferol (VITAMIN D3) tablet 1,000 Units  . diltiazem (CARDIZEM CD) 24 hr capsule 120 mg  . furosemide (LASIX) tablet 40 mg  . insulin aspart (novoLOG) injection 0-9 Units  . loratadine (CLARITIN) tablet 10 mg  . melatonin tablet 10 mg  . methylPREDNISolone sodium succinate (SOLU-MEDROL) 125 mg/2 mL injection 50 mg  . mometasone-formoterol (DULERA) 200-5 MCG/ACT inhaler 2 puff  . remdesivir 100 mg in sodium chloride 0.9 % 100 mL IVPB  . sodium chloride flush (NS) 0.9 % injection 3 mL  . umeclidinium bromide (INCRUSE ELLIPTA) 62.5 MCG/INH 1 puff  . zinc sulfate capsule 220 mg   Outpatient Encounter Medications as of 12/28/2020  Medication Sig  . albuterol (PROAIR HFA) 108 (90 Base) MCG/ACT inhaler Inhale 2 puffs into the lungs every 6 (six) hours as needed for wheezing or shortness of breath.   Marland Kitchen albuterol (PROVENTIL)  (2.5 MG/3ML) 0.083% nebulizer solution Take 3 mLs (2.5 mg total) by nebulization in the morning and at bedtime. (Patient taking differently: Take 2.5 mg by nebulization 2 (two) times daily as needed.)  . apixaban (ELIQUIS) 5 MG TABS tablet Take 1 tablet (5 mg total) by mouth 2 (two) times daily.  Marland Kitchen atorvastatin (LIPITOR) 40 MG tablet TAKE 1 TABLET(40 MG) BY MOUTH DAILY AFTER SUPPER (Patient taking differently: Take 40 mg by mouth daily.)  . azithromycin (ZITHROMAX) 250 MG tablet Take 250 mg by mouth See admin instructions. Start date : 12/23/20 Zpack  . bisoprolol (ZEBETA) 5 MG tablet Take 1 tablet (5 mg total) by mouth daily.  . budesonide-formoterol (SYMBICORT) 160-4.5 MCG/ACT inhaler Inhale 2 puffs into the lungs 2 (two) times daily.  . calcium carbonate (OS-CAL) 600 MG TABS tablet Take 600 mg by mouth daily.  . Cholecalciferol (VITAMIN D) 50 MCG (2000 UT) CAPS Take 2,000 Units by mouth daily.  Marland Kitchen denosumab (PROLIA) 60 MG/ML SOSY injection Inject 60 mg into the skin every 6 (six) months.  . diltiazem (CARDIZEM CD) 180 MG 24 hr capsule Take 1 capsule (180 mg total) by mouth daily.  . furosemide (LASIX) 40 MG tablet Take 1 tablet (40 mg total) by mouth 2 (two) times daily.  Marland Kitchen guaiFENesin (MUCINEX) 600 MG 12 hr tablet Take 600 mg by mouth 2 (two) times daily.  Marland Kitchen loratadine (CLARITIN) 10 MG tablet Take 10 mg by mouth daily.  . Melatonin 5  MG TABS Take 10 mg by mouth at bedtime as needed (sleep).  . Multiple Vitamin (MULTIVITAMIN WITH MINERALS) TABS tablet Take 1 tablet by mouth daily.  . Niacin (VITAMIN B-3 PO) Take 1 tablet by mouth daily.  Marland Kitchen omeprazole (PRILOSEC) 20 MG capsule Take 30- 60 min before your first and last meals of the day (Patient taking differently: Take 20 mg by mouth daily as needed (acid reflux).)  . OXYGEN Inhale 2 L into the lungs daily. Use as directed at night time and when up and walking around.  . predniSONE (DELTASONE) 10 MG tablet Take 25 mg by mouth daily with breakfast.   . Tiotropium Bromide Monohydrate (SPIRIVA RESPIMAT) 2.5 MCG/ACT AERS Inhale 2 puffs into the lungs daily.    Current Diagnosis: Patient Active Problem List   Diagnosis Date Noted  . COVID-19 12/24/2020  . COPD exacerbation (HCC) 12/09/2020  . COPD with acute exacerbation (HCC) 12/08/2020  . Syncope 04/01/2020  . Chronic diastolic CHF (congestive heart failure) (HCC) 04/01/2020  . Osteoporosis with current pathological fracture 02/06/2020  . Essential hypertension 01/28/2020  . Chronic bilateral thoracic back pain 01/25/2020  . 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HCC) 01/25/2020  . HLD (hyperlipidemia) 01/05/2020  . Vertebral compression fracture (HCC) 05/29/2019  . Chronic anemia 05/29/2019  . Pulmonary nodules 05/29/2019  . Coronary artery calcification 04/06/2019  . Atherosclerosis of aorta (HCC) 04/06/2019  . Paroxysmal atrial fibrillation (HCC)   . Acute on chronic respiratory failure with hypoxia (HCC)   . Lung nodule < 6cm on CT 05/29/2016  . Former cigarette smoker 12/05/2015  . COPD GOLD  III     Follow-Up:  Patient Assistance Coordination/Pharmacist Review Lucia Gaskins, CPP notified  Leilani Able, [facility] Clinical Pharmacist Assistant [phone]

## 2020-12-28 NOTE — Plan of Care (Signed)
  Problem: Education: Goal: Knowledge of General Education information will improve Description: Including pain rating scale, medication(s)/side effects and non-pharmacologic comfort measures Outcome: Adequate for Discharge   Problem: Health Behavior/Discharge Planning: Goal: Ability to manage health-related needs will improve Outcome: Adequate for Discharge   Problem: Clinical Measurements: Goal: Ability to maintain clinical measurements within normal limits will improve Outcome: Adequate for Discharge Goal: Will remain free from infection Outcome: Adequate for Discharge Goal: Diagnostic test results will improve Outcome: Adequate for Discharge Goal: Respiratory complications will improve Outcome: Adequate for Discharge Goal: Cardiovascular complication will be avoided Outcome: Adequate for Discharge   Problem: Activity: Goal: Risk for activity intolerance will decrease Outcome: Adequate for Discharge   Problem: Nutrition: Goal: Adequate nutrition will be maintained Outcome: Adequate for Discharge   Problem: Coping: Goal: Level of anxiety will decrease Outcome: Adequate for Discharge   Problem: Elimination: Goal: Will not experience complications related to bowel motility Outcome: Adequate for Discharge Goal: Will not experience complications related to urinary retention Outcome: Adequate for Discharge   Problem: Pain Managment: Goal: General experience of comfort will improve Outcome: Adequate for Discharge   Problem: Safety: Goal: Ability to remain free from injury will improve Outcome: Adequate for Discharge   Problem: Skin Integrity: Goal: Risk for impaired skin integrity will decrease Outcome: Adequate for Discharge   Problem: Education: Goal: Knowledge of risk factors and measures for prevention of condition will improve Outcome: Adequate for Discharge   Problem: Coping: Goal: Psychosocial and spiritual needs will be supported Outcome: Adequate for  Discharge   Problem: Respiratory: Goal: Will maintain a patent airway Outcome: Adequate for Discharge Goal: Complications related to the disease process, condition or treatment will be avoided or minimized Outcome: Adequate for Discharge   

## 2020-12-29 ENCOUNTER — Telehealth: Payer: Self-pay

## 2020-12-29 NOTE — Progress Notes (Signed)
Chronic Care Management Pharmacy Assistant   Name: Paul [name]  MRN: [id number] DOB: Jun 15, 1946  Reason for Encounter: Medication Review for cost analysis of [facility] vs [facility].  All medications were entered into the system for comparison, the analysis showed that for the patient his cost with [facility] would be $ 2746.35 and would be $2730.05 with [facility].  Spoke with patients wife and let her know the cost analysis for both the patient and herself, she has decided that they would both continue to gets their medications from their current pharmacy and if things changed she would let us know.     I told Paul [name] that when she signed his PAP forms she accidentally signed her name, however they will be sent in and if there was any issues we would let her know.    Paul [name] stated she was glad to have her husband home after being in hospital with Covid, she was extra concerned due to him also having COPD.  His wife stated that he was able to get an infusion and seems to be doing better.  She also stated that her Daughter in [place] in [place] had [place] over Christmas and had very little medical care there, but seemed to be doing ok. PCP : [name], MD  Allergies:   Allergies  Allergen Reactions   Daliresp [Roflumilast]     Nausea, vomiting and diarrhea.   Alendronate Other (See Comments)    Jittery/nervous    Medications: Outpatient Encounter Medications as of 12/29/2020  Medication Sig   acetaminophen (TYLENOL) 325 MG tablet Take 2 tablets (650 mg total) by mouth every 6 (six) hours as needed for mild pain or headache (fever >/= 101).   albuterol (PROAIR HFA) 108 (90 Base) MCG/ACT inhaler Inhale 2 puffs into the lungs every 6 (six) hours as needed for wheezing or shortness of breath.    albuterol (PROVENTIL) (2.5 MG/3ML) 0.083% nebulizer solution Take 3 mLs (2.5 mg total) by nebulization in the morning and at bedtime. (Patient taking differently: Take  2.5 mg by nebulization 2 (two) times daily as needed.)   apixaban (ELIQUIS) 5 MG TABS tablet Take 1 tablet (5 mg total) by mouth 2 (two) times daily.   ascorbic acid (VITAMIN C) 500 MG tablet Take 1 tablet (500 mg total) by mouth daily.   atorvastatin (LIPITOR) 40 MG tablet TAKE 1 TABLET(40 MG) BY MOUTH DAILY AFTER SUPPER (Patient taking differently: Take 40 mg by mouth daily.)   benzonatate (TESSALON PERLES) 100 MG capsule Take 2 capsules (200 mg total) by mouth 3 (three) times daily as needed for up to 5 days for cough.   bisoprolol (ZEBETA) 5 MG tablet Take 1 tablet (5 mg total) by mouth daily.   budesonide-formoterol (SYMBICORT) 160-4.5 MCG/ACT inhaler Inhale 2 puffs into the lungs 2 (two) times daily.   calcium carbonate (OS-CAL) 600 MG TABS tablet Take 600 mg by mouth daily.   Cholecalciferol (VITAMIN D) 50 MCG (2000 UT) CAPS Take 2,000 Units by mouth daily.   denosumab (PROLIA) 60 MG/ML SOSY injection Inject 60 mg into the skin every 6 (six) months.   diltiazem (CARDIZEM CD) 120 MG 24 hr capsule Take 1 capsule (120 mg total) by mouth daily.   furosemide (LASIX) 40 MG tablet Take 1 tablet (40 mg total) by mouth 2 (two) times daily.   guaiFENesin (MUCINEX) 600 MG 12 hr tablet Take 600 mg by mouth 2 (two) times daily.   loratadine (CLARITIN) 10 MG tablet  Take 10 mg by mouth daily.   Melatonin 5 MG TABS Take 10 mg by mouth at bedtime as needed (sleep).   Multiple Vitamin (MULTIVITAMIN WITH MINERALS) TABS tablet Take 1 tablet by mouth daily.   Niacin (VITAMIN B-3 PO) Take 1 tablet by mouth daily.   omeprazole (PRILOSEC) 20 MG capsule Take 30- 60 min before your first and last meals of the day (Patient taking differently: Take 20 mg by mouth daily as needed (acid reflux).)   OXYGEN Inhale 2 L into the lungs daily. Use as directed at night time and when up and walking around.   predniSONE (DELTASONE) 10 MG tablet Take 4 tablets (40 mg total) by mouth daily with breakfast for 3  days, THEN 3 tablets (30 mg total) daily with breakfast for 3 days, THEN 2 tablets (20 mg total) daily with breakfast for 3 days, THEN 1 tablet (10 mg total) daily with breakfast for 3 days.   Tiotropium Bromide Monohydrate (SPIRIVA RESPIMAT) 2.5 MCG/ACT AERS Inhale 2 puffs into the lungs daily.   No facility-administered encounter medications on file as of 12/29/2020.    Current Diagnosis: Patient Active Problem List   Diagnosis Date Noted   COVID-19 12/24/2020   COPD exacerbation ([place]) 12/09/2020   COPD with acute exacerbation ([place]) 12/08/2020   Syncope 04/01/2020   Chronic diastolic CHF (congestive heart failure) ([place]) 04/01/2020   Osteoporosis with current pathological fracture 02/06/2020   Essential hypertension 01/28/2020   Chronic bilateral thoracic back pain 01/25/2020   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[place]) 01/25/2020   HLD (hyperlipidemia) 01/05/2020   Vertebral compression fracture ([employer]) 05/29/2019   Chronic anemia 05/29/2019   Pulmonary nodules 05/29/2019   Coronary artery calcification 04/06/2019   Atherosclerosis of aorta ([place]) 04/06/2019   Paroxysmal atrial fibrillation ([place])    Acute on chronic respiratory failure with hypoxia ([place])    Lung nodule < 6cm on CT 05/29/2016   Former cigarette smoker 12/05/2015   COPD GOLD  III      Follow-Up:  Medication Cost Review and Pharmacist Review  Donette Larry, CPP notified  Clarita Leber, [place] Pharmacist Assistant [phone]

## 2020-12-30 LAB — CULTURE, BLOOD (ROUTINE X 2)
Culture: NO GROWTH
Culture: NO GROWTH
Special Requests: ADEQUATE
Special Requests: ADEQUATE

## 2021-01-03 ENCOUNTER — Other Ambulatory Visit: Payer: Self-pay

## 2021-01-03 ENCOUNTER — Ambulatory Visit (INDEPENDENT_AMBULATORY_CARE_PROVIDER_SITE_OTHER): Payer: Medicare Other | Admitting: Adult Health

## 2021-01-03 ENCOUNTER — Encounter: Payer: Self-pay | Admitting: Adult Health

## 2021-01-03 ENCOUNTER — Ambulatory Visit: Payer: Medicare Other | Admitting: Internal Medicine

## 2021-01-03 DIAGNOSIS — J449 Chronic obstructive pulmonary disease, unspecified: Secondary | ICD-10-CM | POA: Diagnosis not present

## 2021-01-03 DIAGNOSIS — J9621 Acute and chronic respiratory failure with hypoxia: Secondary | ICD-10-CM

## 2021-01-03 DIAGNOSIS — U071 COVID-19: Secondary | ICD-10-CM

## 2021-01-03 MED ORDER — PREDNISONE 5 MG PO TABS: ORAL_TABLET | ORAL | 0 refills | Status: DC

## 2021-01-03 NOTE — Assessment & Plan Note (Signed)
Recent decompensation with COVID-19.  Now back to baseline.  Patient continue on O2 at 2 L to keep O2 saturations greater than 88 to 90%.

## 2021-01-03 NOTE — Assessment & Plan Note (Signed)
Resolving COPD exacerbation with recent COVID-19 infection.  Patient is clinically improving getting close back to baseline. We will slowly wean prednisone as patient is very sensitive to dosage changes in the past. Plan  Patient Instructions  Decrease Prednisone dose by 2.5mg  every 3 days until at  Prednisone 10mg  daily -hold at this dose.  Continue on Symbicort 2 puffs Twice daily   Continue on Spiriva daily  Mucinex Twice daily  As needed  Cough/congestion  Flutter valve Three times a day  .  May restart Albuterol Neb Twice daily  . -use the precautions we discussed  Wear Oxygen 2l/m to maintain O2 sats >88-90%.  Activity as tolerated.  Please contact office for sooner follow up if symptoms do not improve or worsen or seek emergency care  Follow up with Dr. Melvyn Novas  Or Aron Needles NP in 6 weeks  and As needed

## 2021-01-03 NOTE — Assessment & Plan Note (Signed)
COVID-19 infection clinically improved patient has received remdesivir and steroids.  Patient is continue to self isolate and quarantine for the full 21 days as recommended.  At home. He has been fully vaccinated with COVID-19 vaccine and booster.

## 2021-01-03 NOTE — Progress Notes (Signed)
@Patient  ID: Paul [name], male    DOB: 1946/01/24, 75 y.o.   MRN: [id number]  Chief Complaint  Patient presents with  . Hospitalization Follow-up    Referring provider: [name], MD  HPI: 75 year old male former smoker quit in 2010 followed for severe Gold 3 COPD and chronic respiratory failure on oxygen patient is prone to recurrent COPD exacerbations and hospitalizations. 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 Patient was discharged last week from the hospital after COVID-19 infection.  Along with a COPD exacerbation.  Patient was treated with a 5-day course of IV remdesivir and IV steroids.  Patient was discharged on a prednisone taper.  Patient is on chronic steroids with 10 mg daily.  He is very sensitive to prednisone dosage changes. Patient says his breathing is feeling better.  He has developed increased cough and congestion.  O2 saturations have been very good at home.  He is using his oxygen with activity and at bedtime with nurse.  O2 saturations at rest have been 95 to 98% on room air. Patient remains on Symbicort and Spiriva.  He has been off his albuterol nebulizers and is using an inhaler. Appetite is good with no nausea vomiting or diarrhea.  Allergies  Allergen Reactions  .  Daliresp [Roflumilast]     Nausea, vomiting and diarrhea.  . Alendronate Other (See Comments)    Jittery/nervous    Immunization History  Administered Date(s) Administered  . Fluad Quad(high Dose 65+) 10/02/2020  . Influenza Split 09/22/2013, 09/01/2015  . Influenza Whole 09/04/2011, 09/21/2012  . Influenza, High Dose Seasonal PF 09/02/2016, 09/10/2017, 08/31/2018, 09/24/2018, 10/21/2019  . Influenza-Unspecified 08/23/2014  . Moderna Sars-Covid-2 Vaccination 01/17/2020, 02/14/2020, 10/06/2020  . Pneumococcal Conjugate-13 01/11/2014, 12/09/2016  . Pneumococcal Polysaccharide-23 08/23/2010, 08/29/2011  . Zoster Recombinat (Shingrix) 04/04/2014    Past Medical History:  Diagnosis Date  . A-fib ([place])   . Anemia   . Aortic atherosclerosis ([place])   . Atrial fibrillation ([place])   . Back pain   . COPD (chronic obstructive pulmonary disease) ([place])   . Diabetes mellitus without complication ([place])   . Elevated PSA   . GAD (generalized anxiety disorder)   . High cholesterol   . Hypertension   . Osteoporosis   . Oxygen deficiency     Tobacco History: Social History   Tobacco Use  Smoking Status Former Smoker  . Packs/day: 2.00  . Years: 52.00  . Pack years: 104.00  . Types: Cigarettes  . Quit date: 02/03/2009  . Years since quitting: 11.9  Smokeless Tobacco Never Used  Tobacco Comment   Counseled to remain smoke free   Counseling given: Not Answered Comment: Counseled to remain smoke free   Outpatient Medications Prior to Visit  Medication Sig Dispense Refill  . acetaminophen (TYLENOL) 325 MG tablet Take 2  tablets (650 mg total) by mouth every 6 (six) hours as needed for mild pain or headache (fever >/= 101).    Marland Kitchen albuterol (PROAIR HFA) 108 (90 Base) MCG/ACT inhaler Inhale 2 puffs into the lungs every 6 (six) hours as needed for wheezing or shortness of breath.     Marland Kitchen albuterol (PROVENTIL) (2.5 MG/3ML) 0.083% nebulizer solution Take 3 mLs (2.5 mg total) by nebulization in  the morning and at bedtime. (Patient taking differently: Take 2.5 mg by nebulization 2 (two) times daily as needed.) 75 mL 6  . apixaban (ELIQUIS) 5 MG TABS tablet Take 1 tablet (5 mg total) by mouth 2 (two) times daily. 180 tablet 1  . ascorbic acid (VITAMIN C) 500 MG tablet Take 1 tablet (500 mg total) by mouth daily.    Marland Kitchen atorvastatin (LIPITOR) 40 MG tablet TAKE 1 TABLET(40 MG) BY MOUTH DAILY AFTER SUPPER (Patient taking differently: Take 40 mg by mouth daily.) 90 tablet 3  . bisoprolol (ZEBETA) 5 MG tablet Take 1 tablet (5 mg total) by mouth daily. 90 tablet 0  . budesonide-formoterol (SYMBICORT) 160-4.5 MCG/ACT inhaler Inhale 2 puffs into the lungs 2 (two) times daily. 1 Inhaler 3  . calcium carbonate (OS-CAL) 600 MG TABS tablet Take 600 mg by mouth daily.    . Cholecalciferol (VITAMIN D) 50 MCG (2000 UT) CAPS Take 2,000 Units by mouth daily.    Marland Kitchen denosumab (PROLIA) 60 MG/ML SOSY injection Inject 60 mg into the skin every 6 (six) months.    . diltiazem (CARDIZEM CD) 120 MG 24 hr capsule Take 1 capsule (120 mg total) by mouth daily. 30 capsule 0  . furosemide (LASIX) 40 MG tablet Take 1 tablet (40 mg total) by mouth 2 (two) times daily. 180 tablet 3  . guaiFENesin (MUCINEX) 600 MG 12 hr tablet Take 600 mg by mouth 2 (two) times daily.    Marland Kitchen loratadine (CLARITIN) 10 MG tablet Take 10 mg by mouth daily.    . Melatonin 5 MG TABS Take 10 mg by mouth at bedtime as needed (sleep).    . Multiple Vitamin (MULTIVITAMIN WITH MINERALS) TABS tablet Take 1 tablet by mouth daily.    . Niacin (VITAMIN B-3 PO) Take 1 tablet by mouth daily.    Marland Kitchen omeprazole (PRILOSEC) 20 MG capsule Take 30- 60 min before your first and last meals of the day (Patient taking differently: Take 20 mg by mouth daily as needed (acid reflux).)    . OXYGEN Inhale 2 L into the lungs daily. Use as directed at night time and when up and walking around.    . predniSONE (DELTASONE) 10 MG tablet Take 4 tablets (40 mg total) by mouth daily with  breakfast for 3 days, THEN 3 tablets (30 mg total) daily with breakfast for 3 days, THEN 2 tablets (20 mg total) daily with breakfast for 3 days, THEN 1 tablet (10 mg total) daily with breakfast for 3 days. 30 tablet 0  . Tiotropium Bromide Monohydrate (SPIRIVA RESPIMAT) 2.5 MCG/ACT AERS Inhale 2 puffs into the lungs daily. 4 g 0   No facility-administered medications prior to visit.     Review of Systems:   Constitutional:   No  weight loss, night sweats,  Fevers, chills,  +fatigue, or  lassitude.  HEENT:   No headaches,  Difficulty swallowing,  Tooth/dental problems, or  Sore throat,                No sneezing, itching, ear ache, nasal congestion, post  nasal drip,   CV:  No chest pain,  Orthopnea, PND, swelling in lower extremities, anasarca, dizziness, palpitations, syncope.   GI  No heartburn, indigestion, abdominal pain, nausea, vomiting, diarrhea, change in bowel habits, loss of appetite, bloody stools.   Resp:    No chest wall deformity  Skin: no rash or lesions.  GU: no dysuria, change in color of urine, no urgency or frequency.  No flank pain, no hematuria   MS:  No joint pain or swelling.  No decreased range of motion.  No back pain.    Physical Exam  BP (!) 124/54 (BP Location: Left Arm, Patient Position: Sitting, Cuff Size: Normal)   Pulse 92   Temp 98.1 F (36.7 C) (Temporal)   Ht 5\' 6"  (1.676 m)   Wt 153 lb 3.2 oz (69.5 kg)   SpO2 95%   BMI 24.73 kg/m   GEN: A/Ox3; pleasant , NAD, well nourished    HEENT:  [place]/AT,   NOSE-clear, THROAT-clear, no lesions, no postnasal drip or exudate noted.   NECK:  Supple w/ fair ROM; no JVD; normal carotid impulses w/o bruits; no thyromegaly or nodules palpated; no lymphadenopathy.    RESP  Clear  P & A; w/o, wheezes/ rales/ or rhonchi. no accessory muscle use, no dullness to percussion  CARD:  RRR, no m/r/g, no peripheral edema, pulses intact, no cyanosis or clubbing.  GI:   Soft & nt; nml bowel sounds; no organomegaly  or masses detected.   Musco: Warm bil, no deformities or joint swelling noted.   Neuro: alert, no focal deficits noted.    Skin: Warm, no lesions or rashes    Lab Results:  CBC   BNP  ProBNP No results found for: PROBNP  Imaging: DG Chest Port 1 View  Result Date: 12/24/2020 CLINICAL DATA:  Chest pain, cough and fever. EXAM: PORTABLE CHEST 1 VIEW COMPARISON:  12/07/2020 FINDINGS: The cardiac silhouette, mediastinal and hilar contours are within normal limits and stable. There is mild tortuosity and calcification the thoracic aorta. Stable underlying emphysematous changes and hyperinflation. No acute overlying pulmonary process. No pleural effusions or pulmonary lesions. The bony thorax is intact. IMPRESSION: Emphysematous changes but no acute overlying pulmonary process. Electronically Signed   By: Marijo Sanes M.D.   On: 12/24/2020 10:56   DG Chest Port 1 View  Result Date: 12/08/2020 CLINICAL DATA:  Shortness of breath EXAM: PORTABLE CHEST 1 VIEW COMPARISON:  12/02/2020 FINDINGS: There is hyperinflation of the lungs compatible with COPD. Heart and mediastinal contours are within normal limits. No focal opacities or effusions. No acute bony abnormality. Aortic atherosclerosis IMPRESSION: COPD.  No active disease. Electronically Signed   By: Rolm Baptise M.D.   On: 12/08/2020 00:08      PFT Results Latest Ref Rng & Units 05/29/2016  FVC-Pre L 1.92  FVC-Predicted Pre % 52  Pre FEV1/FVC % % 41  FEV1-Pre L 0.78  FEV1-Predicted Pre % 29    No results found for: NITRICOXIDE      Assessment & Plan:   COPD GOLD  III  Resolving COPD exacerbation with recent COVID-19 infection.  Patient is clinically improving getting close back to baseline. We will slowly wean prednisone as patient is very sensitive to dosage changes in the past. Plan  Patient Instructions  Decrease Prednisone dose by 2.5mg  every 3 days until at  Prednisone 10mg  daily -hold at this dose.  Continue on  Symbicort 2 puffs Twice daily   Continue on Spiriva daily  Mucinex Twice daily  As needed  Cough/congestion  Flutter valve Three times a day  .  May restart Albuterol Neb Twice daily  . -use the precautions we discussed  Wear Oxygen 2l/m to maintain O2 sats >88-90%.  Activity as tolerated.  Please contact office for sooner follow up if symptoms do not improve or worsen or seek emergency care  Follow up with Dr. Melvyn Novas  Or Barney Russomanno NP in 6 weeks  and As needed        COVID-19 COVID-19 infection clinically improved patient has received remdesivir and steroids.  Patient is continue to self isolate and quarantine for the full 21 days as recommended.  At home. He has been fully vaccinated with COVID-19 vaccine and booster.   Acute on chronic respiratory failure with hypoxia (HCC) Recent decompensation with COVID-19.  Now back to baseline.  Patient continue on O2 at 2 L to keep O2 saturations greater than 88 to 90%.     Rexene Edison, NP 01/03/2021

## 2021-01-03 NOTE — Patient Instructions (Addendum)
Decrease Prednisone dose by 2.5mg  every 3 days until at  Prednisone 10mg  daily -hold at this dose.  Continue on Symbicort 2 puffs Twice daily   Continue on Spiriva daily  Mucinex Twice daily  As needed  Cough/congestion  Flutter valve Three times a day  .  May restart Albuterol Neb Twice daily  . -use the precautions we discussed  Wear Oxygen 2l/m to maintain O2 sats >88-90%.  Activity as tolerated.  Please contact office for sooner follow up if symptoms do not improve or worsen or seek emergency care  Follow up with Dr. Melvyn Novas  Or Asna Muldrow NP in 6 weeks  and As needed

## 2021-01-05 ENCOUNTER — Inpatient Hospital Stay (HOSPITAL_BASED_OUTPATIENT_CLINIC_OR_DEPARTMENT_OTHER)
Admission: EM | Admit: 2021-01-05 | Discharge: 2021-01-09 | DRG: 193 | Disposition: A | Discharge status: Home / Self Care | Payer: Medicare Other | Attending: Internal Medicine | Admitting: Internal Medicine

## 2021-01-05 ENCOUNTER — Encounter (HOSPITAL_BASED_OUTPATIENT_CLINIC_OR_DEPARTMENT_OTHER): Payer: Self-pay

## 2021-01-05 ENCOUNTER — Emergency Department (HOSPITAL_BASED_OUTPATIENT_CLINIC_OR_DEPARTMENT_OTHER): Payer: Medicare Other

## 2021-01-05 ENCOUNTER — Other Ambulatory Visit: Payer: Self-pay

## 2021-01-05 DIAGNOSIS — E782 Mixed hyperlipidemia: Secondary | ICD-10-CM | POA: Diagnosis present

## 2021-01-05 DIAGNOSIS — E1169 Type 2 diabetes mellitus with other specified complication: Secondary | ICD-10-CM | POA: Diagnosis not present

## 2021-01-05 DIAGNOSIS — E860 Dehydration: Secondary | ICD-10-CM | POA: Diagnosis present

## 2021-01-05 DIAGNOSIS — Y95 Nosocomial condition: Secondary | ICD-10-CM | POA: Diagnosis present

## 2021-01-05 DIAGNOSIS — J189 Pneumonia, unspecified organism: Secondary | ICD-10-CM

## 2021-01-05 DIAGNOSIS — R9082 White matter disease, unspecified: Secondary | ICD-10-CM | POA: Diagnosis not present

## 2021-01-05 DIAGNOSIS — H8112 Benign paroxysmal vertigo, left ear: Secondary | ICD-10-CM | POA: Diagnosis not present

## 2021-01-05 DIAGNOSIS — I110 Hypertensive heart disease with heart failure: Secondary | ICD-10-CM | POA: Diagnosis not present

## 2021-01-05 DIAGNOSIS — Z7952 Long term (current) use of systemic steroids: Secondary | ICD-10-CM | POA: Diagnosis not present

## 2021-01-05 DIAGNOSIS — F411 Generalized anxiety disorder: Secondary | ICD-10-CM | POA: Diagnosis present

## 2021-01-05 DIAGNOSIS — Z9981 Dependence on supplemental oxygen: Secondary | ICD-10-CM | POA: Diagnosis not present

## 2021-01-05 DIAGNOSIS — I5032 Chronic diastolic (congestive) heart failure: Secondary | ICD-10-CM | POA: Diagnosis present

## 2021-01-05 DIAGNOSIS — Z8249 Family history of ischemic heart disease and other diseases of the circulatory system: Secondary | ICD-10-CM

## 2021-01-05 DIAGNOSIS — I700 Atherosclerosis of aorta: Secondary | ICD-10-CM | POA: Diagnosis present

## 2021-01-05 DIAGNOSIS — U071 COVID-19: Secondary | ICD-10-CM | POA: Diagnosis not present

## 2021-01-05 DIAGNOSIS — J9621 Acute and chronic respiratory failure with hypoxia: Secondary | ICD-10-CM | POA: Diagnosis present

## 2021-01-05 DIAGNOSIS — J432 Centrilobular emphysema: Secondary | ICD-10-CM | POA: Diagnosis not present

## 2021-01-05 DIAGNOSIS — J449 Chronic obstructive pulmonary disease, unspecified: Secondary | ICD-10-CM | POA: Diagnosis present

## 2021-01-05 DIAGNOSIS — Z87891 Personal history of nicotine dependence: Secondary | ICD-10-CM | POA: Diagnosis not present

## 2021-01-05 DIAGNOSIS — S199XXA Unspecified injury of neck, initial encounter: Secondary | ICD-10-CM | POA: Diagnosis not present

## 2021-01-05 DIAGNOSIS — J188 Other pneumonia, unspecified organism: Principal | ICD-10-CM | POA: Diagnosis present

## 2021-01-05 DIAGNOSIS — S0101XA Laceration without foreign body of scalp, initial encounter: Secondary | ICD-10-CM | POA: Diagnosis present

## 2021-01-05 DIAGNOSIS — M810 Age-related osteoporosis without current pathological fracture: Secondary | ICD-10-CM | POA: Diagnosis present

## 2021-01-05 DIAGNOSIS — I480 Paroxysmal atrial fibrillation: Secondary | ICD-10-CM | POA: Diagnosis not present

## 2021-01-05 DIAGNOSIS — R0602 Shortness of breath: Secondary | ICD-10-CM | POA: Diagnosis not present

## 2021-01-05 DIAGNOSIS — Z7901 Long term (current) use of anticoagulants: Secondary | ICD-10-CM

## 2021-01-05 DIAGNOSIS — J441 Chronic obstructive pulmonary disease with (acute) exacerbation: Secondary | ICD-10-CM | POA: Diagnosis present

## 2021-01-05 DIAGNOSIS — Z888 Allergy status to other drugs, medicaments and biological substances status: Secondary | ICD-10-CM

## 2021-01-05 DIAGNOSIS — E1159 Type 2 diabetes mellitus with other circulatory complications: Secondary | ICD-10-CM | POA: Diagnosis present

## 2021-01-05 DIAGNOSIS — Z7951 Long term (current) use of inhaled steroids: Secondary | ICD-10-CM | POA: Diagnosis not present

## 2021-01-05 DIAGNOSIS — J440 Chronic obstructive pulmonary disease with acute lower respiratory infection: Secondary | ICD-10-CM | POA: Diagnosis present

## 2021-01-05 DIAGNOSIS — Z79899 Other long term (current) drug therapy: Secondary | ICD-10-CM | POA: Diagnosis not present

## 2021-01-05 DIAGNOSIS — H8110 Benign paroxysmal vertigo, unspecified ear: Secondary | ICD-10-CM | POA: Diagnosis not present

## 2021-01-05 DIAGNOSIS — S0003XA Contusion of scalp, initial encounter: Secondary | ICD-10-CM | POA: Diagnosis not present

## 2021-01-05 DIAGNOSIS — R55 Syncope and collapse: Secondary | ICD-10-CM

## 2021-01-05 DIAGNOSIS — J439 Emphysema, unspecified: Secondary | ICD-10-CM | POA: Diagnosis not present

## 2021-01-05 DIAGNOSIS — J984 Other disorders of lung: Secondary | ICD-10-CM | POA: Diagnosis not present

## 2021-01-05 DIAGNOSIS — I10 Essential (primary) hypertension: Secondary | ICD-10-CM | POA: Diagnosis present

## 2021-01-05 HISTORY — DX: Pneumonia, unspecified organism: J18.9

## 2021-01-05 LAB — URINALYSIS, MICROSCOPIC (REFLEX): RBC / HPF: >50 RBC/hpf (ref 0–5)

## 2021-01-05 LAB — CBC
HCT: 45 % (ref 39.0–52.0)
Hemoglobin: 13.7 g/dL (ref 13.0–17.0)
MCH: 24.6 pg — ABNORMAL LOW (ref 26.0–34.0)
MCHC: 30.4 g/dL (ref 30.0–36.0)
MCV: 80.6 fL (ref 80.0–100.0)
Platelets: 354 10*3/uL (ref 150–400)
RBC: 5.58 MIL/uL (ref 4.22–5.81)
RDW: 15.9 % — ABNORMAL HIGH (ref 11.5–15.5)
WBC: 23.5 10*3/uL — ABNORMAL HIGH (ref 4.0–10.5)
nRBC: 0 % (ref 0.0–0.2)

## 2021-01-05 LAB — HEPATIC FUNCTION PANEL
ALT: 39 U/L (ref 0–44)
AST: 27 U/L (ref 15–41)
Albumin: 3.7 g/dL (ref 3.5–5.0)
Alkaline Phosphatase: 62 U/L (ref 38–126)
Bilirubin, Direct: 0.2 mg/dL (ref 0.0–0.2)
Indirect Bilirubin: 0.4 mg/dL (ref 0.3–0.9)
Total Bilirubin: 0.6 mg/dL (ref 0.3–1.2)
Total Protein: 7.5 g/dL (ref 6.5–8.1)

## 2021-01-05 LAB — BASIC METABOLIC PANEL
Anion gap: 14 (ref 5–15)
BUN: 16 mg/dL (ref 8–23)
CO2: 32 mmol/L (ref 22–32)
Calcium: 8.9 mg/dL (ref 8.9–10.3)
Chloride: 91 mmol/L — ABNORMAL LOW (ref 98–111)
Creatinine, Ser: 0.85 mg/dL (ref 0.61–1.24)
GFR, Estimated: >60 mL/min (ref >60)
Glucose, Bld: 141 mg/dL — ABNORMAL HIGH (ref 70–99)
Potassium: 4.1 mmol/L (ref 3.5–5.1)
Sodium: 137 mmol/L (ref 135–145)

## 2021-01-05 LAB — GLUCOSE, CAPILLARY: Glucose-Capillary: 100 mg/dL — ABNORMAL HIGH (ref 70–99)

## 2021-01-05 LAB — URINALYSIS, ROUTINE W REFLEX MICROSCOPIC
Bilirubin Urine: NEGATIVE
Glucose, UA: NEGATIVE mg/dL
Ketones, ur: NEGATIVE mg/dL
Leukocytes,Ua: NEGATIVE
Nitrite: NEGATIVE
Protein, ur: NEGATIVE mg/dL
Specific Gravity, Urine: 1.02 (ref 1.005–1.030)
pH: 7.5 (ref 5.0–8.0)

## 2021-01-05 LAB — TROPONIN I (HIGH SENSITIVITY)
Troponin I (High Sensitivity): 5 ng/L (ref <18)
Troponin I (High Sensitivity): 5 ng/L (ref <18)

## 2021-01-05 MED ORDER — SODIUM CHLORIDE 0.9 % IV SOLN
500.0000 mg | Freq: Once | INTRAVENOUS | Status: AC
  Administered 2021-01-05: 500 mg via INTRAVENOUS
  Filled 2021-01-05: qty 500

## 2021-01-05 MED ORDER — SODIUM CHLORIDE 0.9 % IV SOLN
1.0000 g | Freq: Once | INTRAVENOUS | Status: AC
  Administered 2021-01-05: 1 g via INTRAVENOUS
  Filled 2021-01-05: qty 10

## 2021-01-05 MED ORDER — SODIUM CHLORIDE 0.9 % IV SOLN: INTRAVENOUS | Status: DC

## 2021-01-05 MED ORDER — SODIUM CHLORIDE 0.9 % IV SOLN
2.0000 g | INTRAVENOUS | Status: AC
  Administered 2021-01-06: 2 g via INTRAVENOUS
  Filled 2021-01-05: qty 2

## 2021-01-05 MED ORDER — ENOXAPARIN SODIUM 40 MG/0.4ML ~~LOC~~ SOLN: 40.0000 mg | SUBCUTANEOUS | Status: DC

## 2021-01-05 MED ORDER — SODIUM CHLORIDE 0.9 % IV SOLN
2.0000 g | Freq: Three times a day (TID) | INTRAVENOUS | Status: DC
  Administered 2021-01-06 – 2021-01-09 (×10): 2 g via INTRAVENOUS
  Filled 2021-01-05: qty 0.28
  Filled 2021-01-05 (×10): qty 2

## 2021-01-05 MED ORDER — VANCOMYCIN HCL 1500 MG/300ML IV SOLN
1500.0000 mg | INTRAVENOUS | Status: AC
  Administered 2021-01-06: 1500 mg via INTRAVENOUS
  Filled 2021-01-05: qty 300

## 2021-01-05 MED ORDER — DEXAMETHASONE 4 MG PO TABS
6.0000 mg | ORAL_TABLET | Freq: Every day | ORAL | Status: DC
  Filled 2021-01-05: qty 2

## 2021-01-05 MED ORDER — SODIUM CHLORIDE 0.9 % IV SOLN: INTRAVENOUS | Status: DC | PRN

## 2021-01-05 NOTE — H&P (Signed)
History and Physical   Paul [name] [id number] DOB: 05/14/1946 DOA: 01/05/2021  Referring MD/NP/PA: Kathrene Alu, PA  PCP: [name], MD   Outpatient Specialists: Dr. Cicero Duck, pulmonology  Patient coming from: Home via [facility] [place] [facility]  Chief Complaint: Shortness of breath  HPI: Paul [name] is a 75 y.o. male with medical history significant of Diabetes, hypertension, COPD, atrial fibrillation on chronic anticoagulation with Eliquis who tested positive for COVID on January 2.  Patient presented to the ER with syncopal episode today.  He has been having also some shortness of breath.  Feeling worse.  He was in the hospital from second to 6 January where he was hospitalized and treated.  At that time he was feeling well and went home.  The patient had a syncopal episode today and was down on the floor for a long time.  He did have some laceration of the back of his head.  Patient has been taking his Eliquis but no intracranial hemorrhage.  Denies any chest pain denied any nausea vomiting or diarrhea but has been having some shortness of breath and cough.  Patient is seen in the ER with some mild hypoxia requiring 2 L of oxygen.  He has been using oxygen at night with occasional CPAP.  After evaluation he was noted to have lobar pneumonia.  Patient therefore been admitted to the hospital for further evaluation and treatment.  ED Course: Temperature 98.8 blood pressure 119/107 pulse 81 respiratory rate of 29 oxygen sat now 99% on 2 L.Chemistry appeared to be within normal. WBC 23.5 the rest of the CBC appeared to be within normal.  CT cervical spine showed no acute injury although there is loss of cervical lordosis.  CT head without contrast also showed no acute findings.  Chest x-ray showed lingular pneumonia most likely.  Patient being admitted for treatment of healthcare associated pneumonia and syncope.  Review of Systems: As per HPI otherwise 10 point review of systems negative.     Past Medical History:  Diagnosis Date  . A-fib ([place])   . Anemia   . Aortic atherosclerosis ([place])   . Atrial fibrillation ([place])   . Back pain   . COPD (chronic obstructive pulmonary disease) ([place])   . Diabetes mellitus without complication ([place])   . Elevated PSA   . GAD (generalized anxiety disorder)   . High cholesterol   . Hypertension   . Osteoporosis   . Oxygen deficiency     Past Surgical History:  Procedure Laterality Date  . CATARACT EXTRACTION Right   . SKIN SURGERY     shoulders and chest     reports that he quit smoking about 11 years ago. His smoking use included cigarettes. He has a 104.00 pack-year smoking history. He has never used smokeless tobacco. He reports previous alcohol use. He reports that he does not use drugs.  Allergies  Allergen Reactions  . Daliresp [Roflumilast]     Nausea, vomiting and diarrhea.  . Alendronate Other (See Comments)    Jittery/nervous    Family History  Problem Relation Age of Onset  . Heart disease Brother   . Heart disease Mother   . Heart disease Father   . Cancer Paternal Uncle      Prior to Admission medications   Medication Sig Start Date End Date Taking? Authorizing Provider  acetaminophen (TYLENOL) 325 MG tablet Take 2 tablets (650 mg total) by mouth every 6 (six) hours as needed for mild pain or headache (fever >/=  101). 12/28/20  Yes Adele Barthel D, MD  albuterol (PROAIR HFA) 108 (90 Base) MCG/ACT inhaler Inhale 2 puffs into the lungs every 6 (six) hours as needed for wheezing or shortness of breath.    Yes [provider]  apixaban (ELIQUIS) 5 MG TABS tablet Take 1 tablet (5 mg total) by mouth 2 (two) times daily. 12/27/20  Yes Jerline Pain, MD  ascorbic acid (VITAMIN C) 500 MG tablet Take 1 tablet (500 mg total) by mouth daily. 12/29/20  Yes Adele Barthel D, MD  atorvastatin (LIPITOR) 40 MG tablet TAKE 1 TABLET(40 MG) BY MOUTH DAILY AFTER SUPPER Patient taking differently: Take 40 mg by  mouth daily. 12/27/19  Yes Jerline Pain, MD  bisoprolol (ZEBETA) 5 MG tablet Take 1 tablet (5 mg total) by mouth daily. 12/04/20  Yes Jerline Pain, MD  budesonide-formoterol (SYMBICORT) 160-4.5 MCG/ACT inhaler Inhale 2 puffs into the lungs 2 (two) times daily. 07/25/20  Yes Cox, Kirsten, MD  calcium carbonate (OS-CAL) 600 MG TABS tablet Take 600 mg by mouth daily.   Yes [provider]  Cholecalciferol (VITAMIN D) 50 MCG (2000 UT) CAPS Take 2,000 Units by mouth daily.   Yes [provider]  diltiazem (CARDIZEM CD) 120 MG 24 hr capsule Take 1 capsule (120 mg total) by mouth daily. 12/29/20 01/28/21 Yes Blain Pais, MD  furosemide (LASIX) 40 MG tablet Take 1 tablet (40 mg total) by mouth 2 (two) times daily. 08/23/20  Yes Jerline Pain, MD  guaiFENesin (MUCINEX) 600 MG 12 hr tablet Take 600 mg by mouth 2 (two) times daily.   Yes [provider]  loratadine (CLARITIN) 10 MG tablet Take 10 mg by mouth daily.   Yes [provider]  Melatonin 5 MG TABS Take 10 mg by mouth at bedtime as needed (sleep).   Yes [provider]  Multiple Vitamin (MULTIVITAMIN WITH MINERALS) TABS tablet Take 1 tablet by mouth daily.   Yes [provider]  Niacin (VITAMIN B-3 PO) Take 1 tablet by mouth daily.   Yes [provider]  omeprazole (PRILOSEC) 20 MG capsule Take 30- 60 min before your first and last meals of the day Patient taking differently: Take 20 mg by mouth daily as needed (acid reflux). 10/03/20  Yes Tanda Rockers, MD  OXYGEN Inhale 2 L into the lungs daily. Use as directed at night time and when up and walking around.   Yes [provider]  predniSONE (DELTASONE) 10 MG tablet Take 4 tablets (40 mg total) by mouth daily with breakfast for 3 days, THEN 3 tablets (30 mg total) daily with breakfast for 3 days, THEN 2 tablets (20 mg total) daily with breakfast for 3 days, THEN 1 tablet (10 mg total) daily with breakfast for 3 days. 12/28/20  01/09/21 Yes Blain Pais, MD  predniSONE (DELTASONE) 5 MG tablet Take as directed. 01/03/21  Yes Parrett, Tammy S, NP  albuterol (PROVENTIL) (2.5 MG/3ML) 0.083% nebulizer solution Take 3 mLs (2.5 mg total) by nebulization in the morning and at bedtime. Patient taking differently: Take 2.5 mg by nebulization 2 (two) times daily as needed. 05/09/20   Parrett, Fonnie Mu, NP  denosumab (PROLIA) 60 MG/ML SOSY injection Inject 60 mg into the skin every 6 (six) months.    [provider]  Tiotropium Bromide Monohydrate (SPIRIVA RESPIMAT) 2.5 MCG/ACT AERS Inhale 2 puffs into the lungs daily. 10/03/20   Tanda Rockers, MD    Physical Exam: Vitals:  01/05/21 1918 01/05/21 2049 01/06/21 0113 01/06/21 0501  BP: (!) 160/71 (!) 161/79 (!) 152/72 (!) 153/68  Pulse: 75 81 80 75  Resp: 16 20 20 18   Temp: 97.9 F (36.6 C) 98.2 F (36.8 C) 98.3 F (36.8 C) 98.8 F (37.1 C)  TempSrc: Oral Oral Oral Oral  SpO2: 100% 99% 100% 99%  Weight:      Height:          Constitutional: Acutely ill looking no distress Vitals:   01/05/21 1918 01/05/21 2049 01/06/21 0113 01/06/21 0501  BP: (!) 160/71 (!) 161/79 (!) 152/72 (!) 153/68  Pulse: 75 81 80 75  Resp: 16 20 20 18   Temp: 97.9 F (36.6 C) 98.2 F (36.8 C) 98.3 F (36.8 C) 98.8 F (37.1 C)  TempSrc: Oral Oral Oral Oral  SpO2: 100% 99% 100% 99%  Weight:      Height:       Eyes: PERRL, lids and conjunctivae normal ENMT: Mucous membranes are moist. Posterior pharynx clear of any exudate or lesions.Normal dentition.  Neck: normal, supple, no masses, no thyromegaly Respiratory: Coarse breath sound bilaterally with left lung crackles and rales, normal respiratory effort. No accessory muscle use.  Cardiovascular: Regular rate and rhythm, no murmurs / rubs / gallops. No extremity edema. 2+ pedal pulses. No carotid bruits.  Abdomen: no tenderness, no masses palpated. No hepatosplenomegaly. Bowel sounds positive.  Musculoskeletal: no clubbing  / cyanosis. No joint deformity upper and lower extremities. Good ROM, no contractures. Normal muscle tone.  Skin: no rashes, lesions, ulcers. No induration Neurologic: CN 2-12 grossly intact. Sensation intact, DTR normal. Strength 5/5 in all 4.  Psychiatric: Normal judgment and insight. Alert and oriented x 3. Normal mood.     Labs on Admission: I have personally reviewed following labs and imaging studies  CBC: Recent Labs  Lab 01/05/21 1255  WBC 23.5*  HGB 13.7  HCT 45.0  MCV 80.6  PLT [id number]   Basic Metabolic Panel: Recent Labs  Lab 01/05/21 1255 01/06/21 0259  NA 137 141  K 4.1 4.0  CL 91* 98  CO2 32 33*  GLUCOSE 141* 92  BUN 16 17  CREATININE 0.85 0.77  CALCIUM 8.9 8.4*   GFR: Estimated Creatinine Clearance: 72 mL/min (by C-G formula based on SCr of 0.77 mg/dL). Liver Function Tests: Recent Labs  Lab 01/05/21 1255 01/06/21 0259  AST 27 19  ALT 39 33  ALKPHOS 62 53  BILITOT 0.6 0.6  PROT 7.5 5.9*  ALBUMIN 3.7 2.9*   No results for input(s): LIPASE, AMYLASE in the last 168 hours. No results for input(s): AMMONIA in the last 168 hours. Coagulation Profile: No results for input(s): INR, PROTIME in the last 168 hours. Cardiac Enzymes: No results for input(s): CKTOTAL, CKMB, CKMBINDEX, TROPONINI in the last 168 hours. BNP (last 3 results) No results for input(s): PROBNP in the last 8760 hours. HbA1C: No results for input(s): HGBA1C in the last 72 hours. CBG: Recent Labs  Lab 01/05/21 2259  GLUCAP 100*   Lipid Profile: No results for input(s): CHOL, HDL, LDLCALC, TRIG, CHOLHDL, LDLDIRECT in the last 72 hours. Thyroid Function Tests: No results for input(s): TSH, T4TOTAL, FREET4, T3FREE, THYROIDAB in the last 72 hours. Anemia Panel: No results for input(s): VITAMINB12, FOLATE, FERRITIN, TIBC, IRON, RETICCTPCT in the last 72 hours. Urine analysis:    Component Value Date/Time   COLORURINE YELLOW 01/05/2021 1315   APPEARANCEUR CLEAR 01/05/2021 1315    LABSPEC 1.020 01/05/2021 [place]  7.5 01/05/2021 1315   GLUCOSEU NEGATIVE 01/05/2021 1315   HGBUR LARGE (A) 01/05/2021 [place] 01/05/2021 [place] 01/05/2021 1315   PROTEINUR NEGATIVE 01/05/2021 1315   NITRITE NEGATIVE 01/05/2021 1315   LEUKOCYTESUR NEGATIVE 01/05/2021 1315   Sepsis Labs: @LABRCNTIP (procalcitonin:4,lacticidven:4) ) Recent Results (from the past 240 hour(s))  Culture, blood (routine x 2) Call MD if unable to obtain prior to antibiotics being given     Status: None (Preliminary result)   Collection Time: 01/05/21 10:35 PM   Specimen: Left Antecubital; Blood  Result Value Ref Range Status   Specimen Description   Final    LEFT ANTECUBITAL Performed at [facility], [place] [address], [place], [place] [address]    Special Requests   Final    Blood Culture results may not be optimal due to an excessive volume of blood received in culture bottles BOTTLES DRAWN AEROBIC AND ANAEROBIC Performed at [facility], [place] [address] [place], [place] [address]    Culture PENDING  Incomplete   Report Status PENDING  Incomplete     Radiological Exams on Admission: DG Chest 2 View  Result Date: 01/05/2021 CLINICAL DATA:  Syncope and fall today.  Shortness of breath. EXAM: CHEST - 2 VIEW COMPARISON:  Single-view of the chest 12/24/2020 and 12/07/2020. CT chest 03/27/2020. FINDINGS: The lungs are emphysematous. There is a hazy focus of airspace opacity in the lingula which is new since the prior exams. The right lung is clear. No pneumothorax or pleural effusion. Heart size is normal. Aortic atherosclerosis. No acute or focal bony abnormality. IMPRESSION: Hazy airspace opacity in lingula worrisome for pneumonia. Aortic Atherosclerosis (ICD10-I70.0) and Emphysema (ICD10-J43.9). Electronically Signed   By: Inge Rise M.D.   On: 01/05/2021 13:44   CT Head Wo Contrast  Result Date: 01/05/2021 CLINICAL DATA:  Syncope  with fall. EXAM: CT HEAD WITHOUT CONTRAST CT CERVICAL SPINE WITHOUT CONTRAST TECHNIQUE: Multidetector CT imaging of the head and cervical spine was performed following the standard protocol without intravenous contrast. Multiplanar CT image reconstructions of the cervical spine were also generated. COMPARISON:  Head CT 03/27/2020. FINDINGS: CT HEAD FINDINGS Brain: There is no evidence for acute hemorrhage, hydrocephalus, mass lesion, or abnormal extra-axial fluid collection. No definite CT evidence for acute infarction. Diffuse loss of parenchymal volume is consistent with atrophy. Patchy low attenuation in the deep hemispheric and periventricular white matter is nonspecific, but likely reflects chronic microvascular ischemic demyelination. Vascular: No hyperdense vessel or unexpected calcification. Skull: No evidence for fracture. No worrisome lytic or sclerotic lesion. Sinuses/Orbits: The visualized paranasal sinuses and mastoid air cells are clear. Visualized portions of the globes and intraorbital fat are unremarkable. Other: Soft tissue contusion noted left parietal scalp. CT CERVICAL SPINE FINDINGS Alignment: Straightening of normal cervical lordosis. No subluxation. Skull base and vertebrae: No acute fracture. No primary bone lesion or focal pathologic process. Soft tissues and spinal canal: No prevertebral fluid or swelling. No visible canal hematoma. Disc levels: Loss of disc height noted C2-3 and C5-6. Diffuse facet degeneration noted bilaterally. Upper chest: Centrilobular emphysema with calcific pleuroparenchymal scarring in both apices. Other: None. IMPRESSION: 1. No acute intracranial abnormality. Atrophy with chronic small vessel white matter ischemic disease. 2. Soft tissue contusion left parietal scalp. 3. Degenerative changes in the cervical spine without fracture. 4. Loss of cervical lordosis. This can be related to patient positioning, muscle spasm or soft tissue injury. Electronically Signed    By: Verda Cumins.D.  On: 01/05/2021 13:44   CT Cervical Spine Wo Contrast  Result Date: 01/05/2021 CLINICAL DATA:  Syncope with fall. EXAM: CT HEAD WITHOUT CONTRAST CT CERVICAL SPINE WITHOUT CONTRAST TECHNIQUE: Multidetector CT imaging of the head and cervical spine was performed following the standard protocol without intravenous contrast. Multiplanar CT image reconstructions of the cervical spine were also generated. COMPARISON:  Head CT 03/27/2020. FINDINGS: CT HEAD FINDINGS Brain: There is no evidence for acute hemorrhage, hydrocephalus, mass lesion, or abnormal extra-axial fluid collection. No definite CT evidence for acute infarction. Diffuse loss of parenchymal volume is consistent with atrophy. Patchy low attenuation in the deep hemispheric and periventricular white matter is nonspecific, but likely reflects chronic microvascular ischemic demyelination. Vascular: No hyperdense vessel or unexpected calcification. Skull: No evidence for fracture. No worrisome lytic or sclerotic lesion. Sinuses/Orbits: The visualized paranasal sinuses and mastoid air cells are clear. Visualized portions of the globes and intraorbital fat are unremarkable. Other: Soft tissue contusion noted left parietal scalp. CT CERVICAL SPINE FINDINGS Alignment: Straightening of normal cervical lordosis. No subluxation. Skull base and vertebrae: No acute fracture. No primary bone lesion or focal pathologic process. Soft tissues and spinal canal: No prevertebral fluid or swelling. No visible canal hematoma. Disc levels: Loss of disc height noted C2-3 and C5-6. Diffuse facet degeneration noted bilaterally. Upper chest: Centrilobular emphysema with calcific pleuroparenchymal scarring in both apices. Other: None. IMPRESSION: 1. No acute intracranial abnormality. Atrophy with chronic small vessel white matter ischemic disease. 2. Soft tissue contusion left parietal scalp. 3. Degenerative changes in the cervical spine without fracture. 4.  Loss of cervical lordosis. This can be related to patient positioning, muscle spasm or soft tissue injury. Electronically Signed   By: Misty Stanley M.D.   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  Not consistent with typical COVID-pneumonia.  We will admit the patient and treat him with antibiotics.  Continue COVID-19 isolation protocol he is still within the required 21 days.  #2 syncope: Probably as a result of pneumonia and dehydration.  Continue empiric treatment and hydrate patient.  PT evaluation  #3 type 2 diabetes: Blood sugar is fairly controlled.  Continue treatment with sliding scale insulin  #4 essential hypertension: Resume home regimen.  #5 COVID-19 infection: Patient received course of treatment earlier in the month.  Seems to be reasonably okay from that standpoint.  Will give steroids if required but no treatment for COVID-19 infection  #6 COPD: Mild exacerbation only.      DVT prophylaxis: Eliquis Code Status: Full code Family Communication: No family at bedside Disposition Plan: Home Consults called: None Admission status: Inpatient  Severity of Illness: The appropriate patient status for this patient is INPATIENT. Inpatient status is judged to be reasonable and necessary in order to provide the required intensity of service to ensure the patient's safety. The patient's presenting symptoms, physical exam findings, and initial radiographic and laboratory data in the context of their chronic comorbidities is felt to place them at high risk for further clinical deterioration. Furthermore, it is not anticipated that the patient will be medically stable for discharge from the hospital  within 2 midnights of admission. The following factors support the  patient status of inpatient.   " The patient's presenting symptoms include shortness of breath or syncope. " The worrisome physical exam findings include no significant finding on exam. " The initial radiographic and laboratory data are worrisome because of lingular pneumonia. " The chronic co-morbidities include recent COVID-19 infection and COPD.   * I certify that at the point of admission it is my clinical judgment that the patient will require inpatient hospital care spanning beyond 2 midnights from the point of admission due to high intensity of service, high risk for further deterioration and high frequency of surveillance required.Barbette Merino MD [facility] Pager [phone]  If 7PM-7AM, please contact night-coverage [email or web address] Password [facility]  01/06/2021, 7:47 AM

## 2021-01-05 NOTE — ED Triage Notes (Signed)
Pt states he was covid positive on 1/2. Pt states he was walking in the kitchen preparing lunch when he passed out and fell.  Pt also reports shortness of breath that has worsened since he fell. Pt states he takes eliquis. Pt also endorses he wears oxygen as needed at home but was not wearing it at the time of the syncopal episode.  Laceration noted to back of head from fall. Pt has hx of copd.

## 2021-01-05 NOTE — Progress Notes (Signed)
75 year old male with prior h/oo hypertension, DM, atrial fibrillation on eliquis, tested positive for COVID 19 on JAN 2nd 2022, was admitted to the hospital and discharged on 12/28/20 presented to ED after a syncopal episode. He was found to have Lingular pneumonia. He is currently requiring about 2 lit of [place] oxygen.   Pt accepted to [facility].    Hosie Poisson, MD

## 2021-01-05 NOTE — ED Notes (Signed)
SpO2 stayed in the rang of 96-100, Pt was on 2 L of oxygen during the time of ambulation. Pt stated that he was not dizzy or lightheaded when he was ambulated.

## 2021-01-05 NOTE — Progress Notes (Signed)
Received report on pt from [place]) who received report from [place], [place] ([place]).

## 2021-01-05 NOTE — Patient Instructions (Addendum)
Visit Information  Thank you for your time discussing your medications. I look forward to working with you to achieve your health care goals. Below is a summary of what we talked about during our visit.   Goals Addressed            This Visit's Progress   . Pharmacy Care Plan       CARE PLAN ENTRY (see longitudinal plan of care for additional care plan information)  Current Barriers:  . Chronic Disease Management support, education, and care coordination needs related to Hyperlipidemia, Diabetes, Atrial Fibrillation, and COPD  Hyperlipidemia Lab Results  Component Value Date/Time   LDLCALC 90 11/20/2020 12:02 PM   . Pharmacist Clinical Goal(s): o Over the next 90 days, patient will work with PharmD and providers to maintain LDL goal < 70 . Current regimen:  o Atorvastatin 40 mg daily after supper  . Interventions: o Reviewed medication adherence and lab results.  o Discussed diet and exercise.  . Patient self care activities - Over the next 90 days, patient will: o Continue current medication.   Diabetes Lab Results  Component Value Date/Time   HGBA1C 6.3 (H) 11/20/2020 12:02 PM   HGBA1C 6.0 (H) 06/13/2020 10:02 AM   . Pharmacist Clinical Goal(s): o Over the next 90 days, patient will work with PharmD and providers to maintain A1c goal <7% . Current regimen:  o Diet and Lifestyle . Interventions: o Reviewed lifestyle and diet routine.  o Discussed benefits of blood sugar control.  . Patient self care activities - Over the next 90 days, patient will: o Contact provider with any problems or concerns   COPD . Pharmacist Clinical Goal(s) o Over the next 90 days, patient will work with PharmD and providers to minimize hospitalizations.  . Current regimen:  o Albuterol 108 mcg/act inhaler 2 puffs into the lungs every 6 hours prn for wheezing or shortness of breath o Albuterol 0.083% nebulizer solution bid prn o Symbicort 160/4.5 mcg/act 2 puffs twice daily o Mucinex  600 mg bid o Loratadine 10 mg daily o Spiriva 2.5 mcg/act 2 puffs into the lungs daily o Prednisone adjusted due to symptoms . Interventions: o Pharmacist coordinating patient assistance for Symbicort and Spiriva.  o Reviewed medication regimen.  . Patient self care activities - Over the next 90 days, patient will: o Continue to work closely with pulmonology to reduce exacerbations.   Afib . Pharmacist Clinical Goal(s) o Over the next 90 days, patient will work with PharmD and providers to manage Afib . Current regimen:  o Bisoprolol 5 mg daily  o Diltiazem CD 120 mg daily  o Eliquis 5 mg bid  . Interventions: o Discussed Eliquis patient assistance. Patient's wife has prepared form for cardiology to sign and submit.  . Patient self care activities - Over the next 90 days, patient will: o Continue to take medication as prescribed.   Medication management . Pharmacist Clinical Goal(s): o Over the next 90 days, patient will work with PharmD and providers to maintain optimal medication adherence . Current pharmacy: [facility]  . Interventions o Comprehensive medication review performed. o Continue current medication management strategy . Patient self care activities - Over the next 90 days, patient will: o Focus on medication adherence by pill box o Take medications as prescribed o Report any questions or concerns to PharmD and/or provider(s)  Initial goal documentation        [name] was given information about Chronic Care Management services today including:  1. CCM service includes personalized support from designated clinical staff supervised by his physician, including individualized plan of care and coordination with other care providers 2. 24/7 contact phone numbers for assistance for urgent and routine care needs. 3. Standard insurance, coinsurance, copays and deductibles apply for chronic care management only during months in which we provide at least 20 minutes of  these services. Most insurances cover these services at 100%, however patients may be responsible for any copay, coinsurance and/or deductible if applicable. This service may help you avoid the need for more expensive face-to-face services. 4. Only one practitioner may furnish and bill the service in a calendar month. 5. The patient may stop CCM services at any time (effective at the end of the month) by phone call to the office staff.  Patient agreed to services and verbal consent obtained.   The patient verbalized understanding of instructions, educational materials, and care plan provided today and declined offer to receive copy of patient instructions, educational materials, and care plan.  Telephone follow up appointment with pharmacy team member scheduled for: 01/2021  Sherre Poot, PharmD Clinical Pharmacist [facility] [phone] (office) [phone] (mobile)  Exercises to do While Sitting  Exercises that you do while sitting (chair exercises) can give you many of the same benefits as full exercise. Benefits include strengthening your heart, burning calories, and keeping muscles and joints healthy. Exercise can also improve your mood and help with depression and anxiety. You may benefit from chair exercises if you are unable to do standing exercises because of:  Diabetic foot pain.  Obesity.  Illness.  Arthritis.  Recovery from surgery or injury.  Breathing problems.  Balance problems.  Another type of disability. Before starting chair exercises, check with your health care provider or a physical therapist to find out how much exercise you can tolerate and which exercises are safe for you. If your health care provider approves:  Start out slowly and build up over time. Aim to work up to about 10-20 minutes for each exercise session.  Make exercise part of your daily routine.  Drink water when you exercise. Do not wait until you are thirsty. Drink every  10-15 minutes.  Stop exercising right away if you have pain, nausea, shortness of breath, or dizziness.  If you are exercising in a wheelchair, make sure to lock the wheels.  Ask your health care provider whether you can do tai chi or yoga. Many positions in these mind-body exercises can be modified to do while seated. Warm-up Before starting other exercises: 1. Sit up as straight as you can. Have your knees bent at 90 degrees, which is the shape of the capital letter "L." Keep your feet flat on the floor. 2. Sit at the front edge of your chair, if you can. 3. Pull in (tighten) the muscles in your abdomen and stretch your spine and neck as straight as you can. Hold this position for a few minutes. 4. Breathe in and out evenly. Try to concentrate on your breathing, and relax your mind. Stretching Exercise A: Arm stretch 1. Hold your arms out straight in front of your body. 2. Bend your hands at the wrist with your fingers pointing up, as if signaling someone to stop. Notice the slight tension in your forearms as you hold the position. 3. Keeping your arms out and your hands bent, rotate your hands outward as far as you can and hold this stretch. Aim to have your thumbs pointing up and  your pinkie fingers pointing down. Slowly repeat arm stretches for one minute as tolerated. Exercise B: Leg stretch 1. If you can move your legs, try to "draw" letters on the floor with the toes of your foot. Write your name with one foot. 2. Write your name with the toes of your other foot. Slowly repeat the movements for one minute as tolerated. Exercise C: Reach for the sky 1. Reach your hands as far over your head as you can to stretch your spine. 2. Move your hands and arms as if you are climbing a rope. Slowly repeat the movements for one minute as tolerated. Range of motion exercises Exercise A: Shoulder roll 1. Let your arms hang loosely at your sides. 2. Lift just your shoulders up toward your  ears, then let them relax back down. 3. When your shoulders feel loose, rotate your shoulders in backward and forward circles. Do shoulder rolls slowly for one minute as tolerated. Exercise B: March in place 1. As if you are marching, pump your arms and lift your legs up and down. Lift your knees as high as you can. ? If you are unable to lift your knees, just pump your arms and move your ankles and feet up and down. March in place for one minute as tolerated. Exercise C: Seated jumping jacks 1. Let your arms hang down straight. 2. Keeping your arms straight, lift them up over your head. Aim to point your fingers to the ceiling. 3. While you lift your arms, straighten your legs and slide your heels along the floor to your sides, as wide as you can. 4. As you bring your arms back down to your sides, slide your legs back together. ? If you are unable to use your legs, just move your arms. Slowly repeat seated jumping jacks for one minute as tolerated. Strengthening exercises Exercise A: Shoulder squeeze 1. Hold your arms straight out from your body to your sides, with your elbows bent and your fists pointed at the ceiling. 2. Keeping your arms in the bent position, move them forward so your elbows and forearms meet in front of your face. 3. Open your arms back out as wide as you can with your elbows still bent, until you feel your shoulder blades squeezing together. Hold for 5 seconds. Slowly repeat the movements forward and backward for one minute as tolerated. Contact a health care provider if you:  Had to stop exercising due to any of the following: ? Pain. ? Nausea. ? Shortness of breath. ? Dizziness. ? Fatigue.  Have significant pain or soreness after exercising. Get help right away if you have:  Chest pain.  Difficulty breathing. These symptoms may represent a serious problem that is an emergency. Do not wait to see if the symptoms will go away. Get medical help right away.  Call your local emergency services (911 in the [place] Do not drive yourself to the hospital. This information is not intended to replace advice given to you by your health care provider. Make sure you discuss any questions you have with your health care provider. Document Revised: 04/06/2020 Document Reviewed: 04/06/2020 [employer]vier Patient Education  2021 [employer].

## 2021-01-05 NOTE — Progress Notes (Addendum)
Completed admission and assessment. Pt A/O x4, VSS, no signs of distress noted. Pt currently on 2L o2 via [place], oxygen sat 99. Pt states he wears 2L at night. No c/o of pain at this time. Pt does have laceration to back of head with staples, no active bleeding observed.  Notified admission MD on pt's arrival, awaiting orders.

## 2021-01-05 NOTE — ED Provider Notes (Addendum)
[facility] DEPART[facility] Provider Note   CSN: [id number] Arrival date & time: 01/05/21  1228     History Chief Complaint  Patient presents with  . Loss of Consciousness  . Shortness of Breath    Paul [name] is a 75 y.o. male.  HPI 75 year old male with a history of atrial fibrillation on Eliquis, COPD, DM type II, hypertension, testing positive for COVID-19 on 12/24/2020 presents to the ER with a syncopal episode.  Patient states that he was hospitalized and discharged on 6 January.  Has been overall feeling well, states he was in the kitchen making lunch and next thing he knew he was on the floor.  He does not know how long he was unconscious.  He notes a laceration to the back of his head.  He is compliant with his Eliquis.  He denies any chest pain at the time of the syncopal episode or currently.  Endorses some chest tightness with cough which he attributes to his recent COVID-19 illness and his COPD.  He denies any dizziness at this time.  No nausea or vomiting.  No back pain or abdominal pain.  No dysuria or hematuria. He has continued to cough, no known fevers.  Cough is nonproductive and without evidence of blood.  He attributes this to his COPD and recent infection.  Patient arrived on 2 L nasal cannula, states he only uses supplemental oxygen at night.  He states that EMS noted him to be mildly hypoxic due to all the "excitement" from the fall. Tetanus is up to date within the last 10 years.     Past Medical History:  Diagnosis Date  . A-fib ([place])   . Anemia   . Aortic atherosclerosis ([place])   . Atrial fibrillation ([place])   . Back pain   . COPD (chronic obstructive pulmonary disease) ([place])   . Diabetes mellitus without complication ([place])   . Elevated PSA   . GAD (generalized anxiety disorder)   . High cholesterol   . Hypertension   . Osteoporosis   . Oxygen deficiency     Patient Active Problem List   Diagnosis Date Noted  . Lingular pneumonia  01/05/2021  . COVID-19 12/24/2020  . COPD exacerbation ([place]) 12/09/2020  . COPD with acute exacerbation ([place]) 12/08/2020  . Syncope 04/01/2020  . Chronic diastolic CHF (congestive heart failure) ([place]) 04/01/2020  . Osteoporosis with current pathological fracture 02/06/2020  . Essential hypertension 01/28/2020  . Chronic bilateral thoracic back pain 01/25/2020  . 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[place]) 01/25/2020  . HLD (hyperlipidemia) 01/05/2020  . Vertebral compression fracture ([place]) 05/29/2019  . Chronic anemia 05/29/2019  . Pulmonary nodules 05/29/2019  . Coronary artery calcification 04/06/2019  . Atherosclerosis of aorta ([place]) 04/06/2019  . Paroxysmal atrial fibrillation ([facility])   . Acute on chronic respiratory failure with hypoxia ([place])   . Lung nodule < 6cm on CT 05/29/2016  . Former cigarette smoker 12/05/2015  . COPD GOLD  III      Past Surgical History:  Procedure Laterality Date  . CATARACT EXTRACTION Right   . SKIN SURGERY     shoulders and chest       Family History  Problem Relation Age of Onset  . Heart disease Brother   . Heart disease Mother   . Heart disease Father   . Cancer Paternal Uncle     Social History   Tobacco Use  . Smoking status: Former Smoker    Packs/day: 2.00  Years: 52.00    Pack years: 104.00    Types: Cigarettes    Quit date: 02/03/2009    Years since quitting: 11.9  . Smokeless tobacco: Never Used  . Tobacco comment: Counseled to remain smoke free  Vaping Use  . Vaping Use: Never used  Substance Use Topics  . Alcohol use: Not Currently    Alcohol/week: 0.0 standard drinks    Comment: occ  . Drug use: No    Home Medications Prior to Admission medications   Medication Sig Start Date End Date Taking? Authorizing Provider  acetaminophen (TYLENOL) 325 MG tablet Take 2 tablets (650 mg total) by mouth every 6 (six) hours as needed for mild pain or headache (fever >/= 101). 12/28/20   Blain Pais, MD   albuterol (PROAIR HFA) 108 (90 Base) MCG/ACT inhaler Inhale 2 puffs into the lungs every 6 (six) hours as needed for wheezing or shortness of breath.     [provider]  albuterol (PROVENTIL) (2.5 MG/3ML) 0.083% nebulizer solution Take 3 mLs (2.5 mg total) by nebulization in the morning and at bedtime. Patient taking differently: Take 2.5 mg by nebulization 2 (two) times daily as needed. 05/09/20   Parrett, Fonnie Mu, NP  apixaban (ELIQUIS) 5 MG TABS tablet Take 1 tablet (5 mg total) by mouth 2 (two) times daily. 12/27/20   Jerline Pain, MD  ascorbic acid (VITAMIN C) 500 MG tablet Take 1 tablet (500 mg total) by mouth daily. 12/29/20   Blain Pais, MD  atorvastatin (LIPITOR) 40 MG tablet TAKE 1 TABLET(40 MG) BY MOUTH DAILY AFTER SUPPER Patient taking differently: Take 40 mg by mouth daily. 12/27/19   Jerline Pain, MD  bisoprolol (ZEBETA) 5 MG tablet Take 1 tablet (5 mg total) by mouth daily. 12/04/20   Jerline Pain, MD  budesonide-formoterol (SYMBICORT) 160-4.5 MCG/ACT inhaler Inhale 2 puffs into the lungs 2 (two) times daily. 07/25/20   Cox, Elnita Maxwell, MD  calcium carbonate (OS-CAL) 600 MG TABS tablet Take 600 mg by mouth daily.    [provider]  Cholecalciferol (VITAMIN D) 50 MCG (2000 UT) CAPS Take 2,000 Units by mouth daily.    [provider]  denosumab (PROLIA) 60 MG/ML SOSY injection Inject 60 mg into the skin every 6 (six) months.    [provider]  diltiazem (CARDIZEM CD) 120 MG 24 hr capsule Take 1 capsule (120 mg total) by mouth daily. 12/29/20 01/28/21  Blain Pais, MD  furosemide (LASIX) 40 MG tablet Take 1 tablet (40 mg total) by mouth 2 (two) times daily. 08/23/20   Jerline Pain, MD  guaiFENesin (MUCINEX) 600 MG 12 hr tablet Take 600 mg by mouth 2 (two) times daily.    [provider]  loratadine (CLARITIN) 10 MG tablet Take 10 mg by mouth daily.    [provider]  Melatonin 5 MG TABS Take 10 mg by mouth at bedtime as  needed (sleep).    [provider]  Multiple Vitamin (MULTIVITAMIN WITH MINERALS) TABS tablet Take 1 tablet by mouth daily.    [provider]  Niacin (VITAMIN B-3 PO) Take 1 tablet by mouth daily.    [provider]  omeprazole (PRILOSEC) 20 MG capsule Take 30- 60 min before your first and last meals of the day Patient taking differently: Take 20 mg by mouth daily as needed (acid reflux). 10/03/20   Tanda Rockers, MD  OXYGEN Inhale 2 L into the lungs daily. Use as directed  at night time and when up and walking around.    [provider]  predniSONE (DELTASONE) 10 MG tablet Take 4 tablets (40 mg total) by mouth daily with breakfast for 3 days, THEN 3 tablets (30 mg total) daily with breakfast for 3 days, THEN 2 tablets (20 mg total) daily with breakfast for 3 days, THEN 1 tablet (10 mg total) daily with breakfast for 3 days. 12/28/20 01/09/21  Blain Pais, MD  predniSONE (DELTASONE) 5 MG tablet Take as directed. 01/03/21   Parrett, Fonnie Mu, NP  Tiotropium Bromide Monohydrate (SPIRIVA RESPIMAT) 2.5 MCG/ACT AERS Inhale 2 puffs into the lungs daily. 10/03/20   Tanda Rockers, MD    Allergies    Daliresp [roflumilast] and Alendronate  Review of Systems   Review of Systems  Constitutional: Negative for chills and fever.  HENT: Negative for ear pain and sore throat.   Eyes: Negative for pain and visual disturbance.  Respiratory: Negative for cough and shortness of breath.   Cardiovascular: Negative for chest pain and palpitations.  Gastrointestinal: Negative for abdominal pain and vomiting.  Genitourinary: Negative for dysuria and hematuria.  Musculoskeletal: Negative for arthralgias and back pain.  Skin: Negative for color change and rash.  Neurological: Positive for syncope. Negative for seizures.  All other systems reviewed and are negative.   Physical Exam Updated Vital Signs BP 134/74   Pulse 73   Temp 98.2 F (36.8 C) (Oral)   Resp (!) 21    Ht 5\' 6"  (1.676 m)   Wt 68.5 kg   SpO2 100%   BMI 24.37 kg/m   Physical Exam Vitals and nursing note reviewed.  Constitutional:      General: He is not in acute distress.    Appearance: He is well-developed and well-nourished. He is not ill-appearing, toxic-appearing or diaphoretic.  HENT:     Head: Normocephalic and atraumatic.     Comments: No of hemotympanum, raccoon eyes, battle sign.  No mastoid tenderness.  No malocclusion. Full range of motion of head and neck   Eyes:     Conjunctiva/sclera: Conjunctivae normal.  Cardiovascular:     Rate and Rhythm: Normal rate and regular rhythm.     Heart sounds: No murmur heard.   Pulmonary:     Effort: Pulmonary effort is normal. No respiratory distress.     Breath sounds: Decreased breath sounds and wheezing present. No rhonchi or rales.  Chest:     Chest wall: No tenderness.  Abdominal:     Palpations: Abdomen is soft.     Tenderness: There is no abdominal tenderness.  Musculoskeletal:        General: No edema. Normal range of motion.     Cervical back: Neck supple.     Right lower leg: No tenderness. No edema.     Left lower leg: No tenderness. No edema.     Comments: 2 3 cm lacerations parallel to one another on the left occipital scalp.  Bleeding controlled.  Mild hematoma noted. No midline tenderness to the CSPINE, TSPINE or LSPINE  Skin:    General: Skin is warm and dry.  Neurological:     General: No focal deficit present.     Mental Status: He is alert.  Psychiatric:        Mood and Affect: Mood and affect and mood normal.        Behavior: Behavior normal.     ED Results / Procedures / Treatments   Labs (all labs ordered  are listed, but only abnormal results are displayed) Labs Reviewed  BASIC METABOLIC PANEL - Abnormal; Notable for the following components:      Result Value   Chloride 91 (*)    Glucose, Bld 141 (*)    All other components within normal limits  CBC - Abnormal; Notable for the following  components:   WBC 23.5 (*)    MCH 24.6 (*)    RDW 15.9 (*)    All other components within normal limits  HEPATIC FUNCTION PANEL  URINALYSIS, ROUTINE W REFLEX MICROSCOPIC  TROPONIN I (HIGH SENSITIVITY)  TROPONIN I (HIGH SENSITIVITY)    EKG None  Radiology DG Chest 2 View  Result Date: 01/05/2021 CLINICAL DATA:  Syncope and fall today.  Shortness of breath. EXAM: CHEST - 2 VIEW COMPARISON:  Single-view of the chest 12/24/2020 and 12/07/2020. CT chest 03/27/2020. FINDINGS: The lungs are emphysematous. There is a hazy focus of airspace opacity in the lingula which is new since the prior exams. The right lung is clear. No pneumothorax or pleural effusion. Heart size is normal. Aortic atherosclerosis. No acute or focal bony abnormality. IMPRESSION: Hazy airspace opacity in lingula worrisome for pneumonia. Aortic Atherosclerosis (ICD10-I70.0) and Emphysema (ICD10-J43.9). Electronically Signed   By: Inge Rise M.D.   On: 01/05/2021 13:44   CT Head Wo Contrast  Result Date: 01/05/2021 CLINICAL DATA:  Syncope with fall. EXAM: CT HEAD WITHOUT CONTRAST CT CERVICAL SPINE WITHOUT CONTRAST TECHNIQUE: Multidetector CT imaging of the head and cervical spine was performed following the standard protocol without intravenous contrast. Multiplanar CT image reconstructions of the cervical spine were also generated. COMPARISON:  Head CT 03/27/2020. FINDINGS: CT HEAD FINDINGS Brain: There is no evidence for acute hemorrhage, hydrocephalus, mass lesion, or abnormal extra-axial fluid collection. No definite CT evidence for acute infarction. Diffuse loss of parenchymal volume is consistent with atrophy. Patchy low attenuation in the deep hemispheric and periventricular white matter is nonspecific, but likely reflects chronic microvascular ischemic demyelination. Vascular: No hyperdense vessel or unexpected calcification. Skull: No evidence for fracture. No worrisome lytic or sclerotic lesion. Sinuses/Orbits: The  visualized paranasal sinuses and mastoid air cells are clear. Visualized portions of the globes and intraorbital fat are unremarkable. Other: Soft tissue contusion noted left parietal scalp. CT CERVICAL SPINE FINDINGS Alignment: Straightening of normal cervical lordosis. No subluxation. Skull base and vertebrae: No acute fracture. No primary bone lesion or focal pathologic process. Soft tissues and spinal canal: No prevertebral fluid or swelling. No visible canal hematoma. Disc levels: Loss of disc height noted C2-3 and C5-6. Diffuse facet degeneration noted bilaterally. Upper chest: Centrilobular emphysema with calcific pleuroparenchymal scarring in both apices. Other: None. IMPRESSION: 1. No acute intracranial abnormality. Atrophy with chronic small vessel white matter ischemic disease. 2. Soft tissue contusion left parietal scalp. 3. Degenerative changes in the cervical spine without fracture. 4. Loss of cervical lordosis. This can be related to patient positioning, muscle spasm or soft tissue injury. Electronically Signed   By: Misty Stanley M.D.   On: 01/05/2021 13:44   CT Cervical Spine Wo Contrast  Result Date: 01/05/2021 CLINICAL DATA:  Syncope with fall. EXAM: CT HEAD WITHOUT CONTRAST CT CERVICAL SPINE WITHOUT CONTRAST TECHNIQUE: Multidetector CT imaging of the head and cervical spine was performed following the standard protocol without intravenous contrast. Multiplanar CT image reconstructions of the cervical spine were also generated. COMPARISON:  Head CT 03/27/2020. FINDINGS: CT HEAD FINDINGS Brain: There is no evidence for acute hemorrhage, hydrocephalus, mass lesion, or abnormal extra-axial fluid  collection. No definite CT evidence for acute infarction. Diffuse loss of parenchymal volume is consistent with atrophy. Patchy low attenuation in the deep hemispheric and periventricular white matter is nonspecific, but likely reflects chronic microvascular ischemic demyelination. Vascular: No hyperdense  vessel or unexpected calcification. Skull: No evidence for fracture. No worrisome lytic or sclerotic lesion. Sinuses/Orbits: The visualized paranasal sinuses and mastoid air cells are clear. Visualized portions of the globes and intraorbital fat are unremarkable. Other: Soft tissue contusion noted left parietal scalp. CT CERVICAL SPINE FINDINGS Alignment: Straightening of normal cervical lordosis. No subluxation. Skull base and vertebrae: No acute fracture. No primary bone lesion or focal pathologic process. Soft tissues and spinal canal: No prevertebral fluid or swelling. No visible canal hematoma. Disc levels: Loss of disc height noted C2-3 and C5-6. Diffuse facet degeneration noted bilaterally. Upper chest: Centrilobular emphysema with calcific pleuroparenchymal scarring in both apices. Other: None. IMPRESSION: 1. No acute intracranial abnormality. Atrophy with chronic small vessel white matter ischemic disease. 2. Soft tissue contusion left parietal scalp. 3. Degenerative changes in the cervical spine without fracture. 4. Loss of cervical lordosis. This can be related to patient positioning, muscle spasm or soft tissue injury. Electronically Signed   By: Misty Stanley M.D.   On: 01/05/2021 13:44    Procedures .Marland KitchenLaceration Repair  Date/Time: 01/05/2021 3:17 PM Performed by: Garald Balding, PA-C Authorized by: Garald Balding, PA-C   Consent:    Consent obtained:  Verbal   Consent given by:  Patient   Risks discussed:  Infection, need for additional repair, pain, poor cosmetic result and poor wound healing   Alternatives discussed:  No treatment and delayed treatment Universal protocol:    Procedure explained and questions answered to patient or proxy's satisfaction: yes     Relevant documents present and verified: yes     Test results available: yes     Imaging studies available: yes     Required blood products, implants, devices, and special equipment available: yes     Site/side marked: yes      Immediately prior to procedure, a time out was called: yes     Patient identity confirmed:  Verbally with patient Anesthesia:    Anesthesia method:  Local infiltration Laceration details:    Location:  Scalp   Scalp location:  Occipital   Length (cm):  3   Depth (mm):  1 Treatment:    Area cleansed with:  Povidone-iodine and saline   Amount of cleaning:  Standard   Irrigation volume:  20cc   Visualized foreign bodies/material removed: no     Debridement:  None Skin repair:    Repair method:  Staples   Number of staples:  5 Approximation:    Approximation:  Close Repair type:    Repair type:  Simple Post-procedure details:    Dressing:  Non-adherent dressing   Procedure completion:  Tolerated   (including critical care time)  Medications Ordered in ED Medications  cefTRIAXone (ROCEPHIN) 1 g in sodium chloride 0.9 % 100 mL IVPB (1 g Intravenous New Bag/Given 01/05/21 1522)  azithromycin (ZITHROMAX) 500 mg in sodium chloride 0.9 % 250 mL IVPB (has no administration in time range)    ED Course  I have reviewed the triage vital signs and the nursing notes.  Pertinent labs & imaging results that were available during my care of the patient were reviewed by me and considered in my medical decision making (see chart for details).    MDM Rules/Calculators/A&P  75 year old male who presents to the ER after a syncopal episode.  On arrival, vitals are  CC: 75 year old male presents to the ER after syncopal episode.  Recently discharged from the hospital for COVID-19. VS: On arrival, patient arrived on 2 L nasal cannula, states he normally uses supplemental oxygen just at night.  O2 turned off, patient remained with sats at 100% at rest.  Not tachycardic or tachypneic on my exam.  Mild auditory wheezes noted. [id number] provided by patient, wife at bedside.  Previous records obtained and reviewed. DDX:The patient's complaint of syncope involves an  extensive number of diagnostic and treatment options, and is a complaint that carries with it a high risk of complications, morbidity, and potential mortality. Given the large differential diagnosis, medical decision making is of high complexity.  DDx includes dehydration, PE, ACS, dissection, sepsis Labs: I ordered reviewed and interpreted labs which include -CBC with a leukocytosis of 23.5.  BMP without any significant abnormalities. -Hepatic function panel normal -Initial troponin normal -UA pending Imaging: I ordered and reviewed images which included  Chest xray suspicious for new pneumonia. I independently visualized and interpreted all imaging.  EKG: NS rhythm, no evidence of ischemia  MDM: Patient presented with syncopal episode, likely due to weakness in the setting of a new pneumonia.  Patient has a leukocytosis of 23.7, he was started on Rocephin and azithromycin.  Patient will need admission for further management, IV antibiotics, close monitoring.  Low suspicion for PE, patient is not tachycardic, no chest pain or shortness of breath, is already on Eliquis.  Low suspicion for ACS as troponins are reassuring.  No back pain, dizziness, low suspicion for dissection at this time.  Suspect this is all secondary to his new pneumonia.  Lacerations repaired with staples with no complications.  Tetanus is up-to-date.  Consults: Spoke with hospitalist Dr. Karleen Hampshire who will admit the patient for management of superimposed pneumonia in the setting of recent COVID-19 illness. Patient disposition:The patient appears reasonably stabilized for admission considering the current resources, flow, and capabilities available in the ED at this time, and I doubt any other [facility] requiring further screening and/or treatment in the ED prior to admission.  This was a shared visit with my supervising physician Dr. [name]  who independently saw and evaluated the patient & provided guidance in  evaluation/management/disposition ,in agreement with care   Final Clinical Impression(s) / ED Diagnoses Final diagnoses:  Syncope and collapse  Healthcare-associated pneumonia    Rx / DC Orders ED Discharge Orders    None          Lyndel Safe 01/05/21 1530    Luna Fuse, MD 01/10/21 [phone]

## 2021-01-05 NOTE — ED Notes (Signed)
Patient transported to CT 

## 2021-01-05 NOTE — ED Notes (Signed)
[facility] at Bedside; Report given to same.  

## 2021-01-06 DIAGNOSIS — J449 Chronic obstructive pulmonary disease, unspecified: Secondary | ICD-10-CM

## 2021-01-06 DIAGNOSIS — J441 Chronic obstructive pulmonary disease with (acute) exacerbation: Secondary | ICD-10-CM

## 2021-01-06 DIAGNOSIS — U071 COVID-19: Secondary | ICD-10-CM

## 2021-01-06 DIAGNOSIS — J9621 Acute and chronic respiratory failure with hypoxia: Secondary | ICD-10-CM

## 2021-01-06 LAB — CBC
HCT: 38.1 % — ABNORMAL LOW (ref 39.0–52.0)
Hemoglobin: 11.3 g/dL — ABNORMAL LOW (ref 13.0–17.0)
MCH: 24.5 pg — ABNORMAL LOW (ref 26.0–34.0)
MCHC: 29.7 g/dL — ABNORMAL LOW (ref 30.0–36.0)
MCV: 82.6 fL (ref 80.0–100.0)
Platelets: 288 10*3/uL (ref 150–400)
RBC: 4.61 MIL/uL (ref 4.22–5.81)
RDW: 16.4 % — ABNORMAL HIGH (ref 11.5–15.5)
WBC: 14 10*3/uL — ABNORMAL HIGH (ref 4.0–10.5)
nRBC: 0 % (ref 0.0–0.2)

## 2021-01-06 LAB — STREP PNEUMONIAE URINARY ANTIGEN: Strep Pneumo Urinary Antigen: NEGATIVE

## 2021-01-06 LAB — COMPREHENSIVE METABOLIC PANEL
ALT: 33 U/L (ref 0–44)
AST: 19 U/L (ref 15–41)
Albumin: 2.9 g/dL — ABNORMAL LOW (ref 3.5–5.0)
Alkaline Phosphatase: 53 U/L (ref 38–126)
Anion gap: 10 (ref 5–15)
BUN: 17 mg/dL (ref 8–23)
CO2: 33 mmol/L — ABNORMAL HIGH (ref 22–32)
Calcium: 8.4 mg/dL — ABNORMAL LOW (ref 8.9–10.3)
Chloride: 98 mmol/L (ref 98–111)
Creatinine, Ser: 0.77 mg/dL (ref 0.61–1.24)
GFR, Estimated: >60 mL/min (ref >60)
Glucose, Bld: 92 mg/dL (ref 70–99)
Potassium: 4 mmol/L (ref 3.5–5.1)
Sodium: 141 mmol/L (ref 135–145)
Total Bilirubin: 0.6 mg/dL (ref 0.3–1.2)
Total Protein: 5.9 g/dL — ABNORMAL LOW (ref 6.5–8.1)

## 2021-01-06 LAB — MRSA PCR SCREENING: MRSA by PCR: NEGATIVE

## 2021-01-06 LAB — HIV ANTIBODY (ROUTINE TESTING W REFLEX): HIV Screen 4th Generation wRfx: NONREACTIVE

## 2021-01-06 MED ORDER — VANCOMYCIN HCL 1500 MG/300ML IV SOLN: 1500.0000 mg | INTRAVENOUS | Status: DC

## 2021-01-06 MED ORDER — ATORVASTATIN CALCIUM 40 MG PO TABS
40.0000 mg | ORAL_TABLET | Freq: Every day | ORAL | Status: DC
  Administered 2021-01-06 – 2021-01-08 (×3): 40 mg via ORAL
  Filled 2021-01-06 (×3): qty 1

## 2021-01-06 MED ORDER — AZITHROMYCIN 250 MG PO TABS: 250.0000 mg | ORAL_TABLET | Freq: Every day | ORAL | Status: DC

## 2021-01-06 MED ORDER — TIOTROPIUM BROMIDE MONOHYDRATE 2.5 MCG/ACT IN AERS: 2.0000 | INHALATION_SPRAY | Freq: Every day | RESPIRATORY_TRACT | Status: DC

## 2021-01-06 MED ORDER — IPRATROPIUM-ALBUTEROL 20-100 MCG/ACT IN AERS: 1.0000 | INHALATION_SPRAY | Freq: Four times a day (QID) | RESPIRATORY_TRACT | Status: DC

## 2021-01-06 MED ORDER — DILTIAZEM HCL ER COATED BEADS 120 MG PO CP24
120.0000 mg | ORAL_CAPSULE | Freq: Every day | ORAL | Status: DC
Start: 2021-01-06 — End: 2021-01-09
  Administered 2021-01-06 – 2021-01-09 (×4): 120 mg via ORAL
  Filled 2021-01-06 (×4): qty 1

## 2021-01-06 MED ORDER — MOMETASONE FURO-FORMOTEROL FUM 200-5 MCG/ACT IN AERO
2.0000 | INHALATION_SPRAY | Freq: Two times a day (BID) | RESPIRATORY_TRACT | Status: DC
  Administered 2021-01-06 – 2021-01-09 (×7): 2 via RESPIRATORY_TRACT
  Filled 2021-01-06: qty 8.8

## 2021-01-06 MED ORDER — BISOPROLOL FUMARATE 5 MG PO TABS
5.0000 mg | ORAL_TABLET | Freq: Every day | ORAL | Status: DC
  Administered 2021-01-06 – 2021-01-09 (×4): 5 mg via ORAL
  Filled 2021-01-06 (×4): qty 1

## 2021-01-06 MED ORDER — AZITHROMYCIN 250 MG PO TABS
250.0000 mg | ORAL_TABLET | Freq: Every day | ORAL | Status: AC
  Administered 2021-01-06 – 2021-01-09 (×4): 250 mg via ORAL
  Filled 2021-01-06 (×4): qty 1

## 2021-01-06 MED ORDER — UMECLIDINIUM BROMIDE 62.5 MCG/INH IN AEPB
1.0000 | INHALATION_SPRAY | Freq: Every day | RESPIRATORY_TRACT | Status: DC
  Administered 2021-01-06 – 2021-01-09 (×4): 1 via RESPIRATORY_TRACT
  Filled 2021-01-06: qty 7

## 2021-01-06 MED ORDER — ALBUTEROL SULFATE HFA 108 (90 BASE) MCG/ACT IN AERS
1.0000 | INHALATION_SPRAY | RESPIRATORY_TRACT | Status: DC | PRN
  Administered 2021-01-06 – 2021-01-09 (×3): 1 via RESPIRATORY_TRACT
  Filled 2021-01-06: qty 6.7

## 2021-01-06 MED ORDER — AZITHROMYCIN 250 MG PO TABS: 500.0000 mg | ORAL_TABLET | Freq: Every day | ORAL | Status: DC

## 2021-01-06 MED ORDER — PREDNISONE 20 MG PO TABS
40.0000 mg | ORAL_TABLET | Freq: Every day | ORAL | Status: DC
  Administered 2021-01-06 – 2021-01-09 (×4): 40 mg via ORAL
  Filled 2021-01-06 (×4): qty 2

## 2021-01-06 MED ORDER — APIXABAN 5 MG PO TABS
5.0000 mg | ORAL_TABLET | Freq: Two times a day (BID) | ORAL | Status: DC
  Administered 2021-01-06 – 2021-01-09 (×7): 5 mg via ORAL
  Filled 2021-01-06 (×7): qty 1

## 2021-01-06 MED ORDER — IPRATROPIUM-ALBUTEROL 20-100 MCG/ACT IN AERS
1.0000 | INHALATION_SPRAY | Freq: Four times a day (QID) | RESPIRATORY_TRACT | Status: DC | PRN
  Administered 2021-01-06: 1 via RESPIRATORY_TRACT
  Filled 2021-01-06 (×2): qty 4

## 2021-01-06 MED ORDER — FUROSEMIDE 40 MG PO TABS
40.0000 mg | ORAL_TABLET | Freq: Two times a day (BID) | ORAL | Status: DC
  Administered 2021-01-06 – 2021-01-09 (×7): 40 mg via ORAL
  Filled 2021-01-06 (×7): qty 1

## 2021-01-06 NOTE — Progress Notes (Signed)
PROGRESS NOTE    Paul [name]    Code Status: Full Code  ET:[id number] DOB: [birth year] DOA: 01/05/2021 LOS: 1 days  PCP: [name], MD CC:  Chief Complaint  Patient presents with  . Loss of Consciousness  . Shortness of Breath       Hospital Summary   This is a 75 year old male with history of diabetes, hypertension, COPD on as needed oxygen, atrial fibrillation on Eliquis, COVID-19 positive on 12/24/2020 who presented to [facility] ED on 1/14 s/p syncopal episode with subsequent posterior head laceration as well as shortness of breath.  Recent hospitalization from 1/3-1/6 for acute hypoxic respiratory failure secondary to COVID-19 and completed remdesivir and was discharged with a steroid taper at the time.  In the ED CT head and cervical spine unremarkable, CXR with lingular pneumonia.  He was started on vancomycin and cefepime.    A & P   Principal Problem:   Lingular pneumonia Active Problems:   COPD GOLD  III    Acute on chronic respiratory failure with hypoxia (HCC)   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HCC)   Essential hypertension   Syncope   COPD exacerbation ([place])   COVID-19   1. COPD exacerbation secondary to lingular pneumonia/HAP a. MRSA nares negative, discontinue vancomycin b. Significant shortness of breath with poor air movement c. Continue cefepime add on azithromycin d. Continue Incruse Ellipta and Dulera (modified from home inhalers per formulary) e. Change dexamethasone to prednisone 40 mg daily f. As needed albuterol inhaler  2. Syncope with resultant posterior scalp laceration, Likely secondary to pneumonia and dehydration a. On Eliquis, CT head negative b. Laceration bandaged in the ED c. PT eval d. Telemetry e. Treatment as above  3. Type 2 diabetes a. Continue sliding scale  4. Hypertension a. Continue home regimen  5. COVID-19 a. Diagnosed 12/24/2020, s/p 5 days remdesivir and steroid taper b. COVID-19 symptoms have likely resolved  and symptoms most likely related to pneumonia as above  6. Paroxysmal A. Fib  chronic HFpEF, not in exacerbation a. Continue home bisoprolol, diltiazem, Eliquis, Lasix b. Telemetry  7. Hyperlipidemia a. Continue statin  DVT prophylaxis:  apixaban (ELIQUIS) tablet 5 mg   Family Communication: Patient updated at bedside  Disposition Plan:  Status is: Inpatient  Remains inpatient appropriate because:IV treatments appropriate due to intensity of illness or inability to take PO and Inpatient level of care appropriate due to severity of illness   Dispo: The patient is from: Home              Anticipated d/c is to: Home              Anticipated d/c date is: 2 days              Patient currently is not medically stable to d/c.           Pressure injury documentation    None  Consultants  None  Procedures  None  Antibiotics   Anti-infectives (From admission, onward)   Start     Dose/Rate Route Frequency Ordered Stop   01/07/21 1000  azithromycin (ZITHROMAX) tablet 250 mg  Status:  Discontinued       "Followed by" Linked Group Details   250 mg Oral Daily 01/06/21 0937 01/06/21 0944   01/07/21 0000  vancomycin (VANCOREADY) IVPB 1500 mg/300 mL  Status:  Discontinued        1,500 mg 150 mL/hr over 120 Minutes Intravenous Every 24 hours 01/06/21 0004  01/06/21 0937   01/06/21 1200  azithromycin (ZITHROMAX) tablet 250 mg        250 mg Oral Daily 01/06/21 0943 01/10/21 0959   01/06/21 1030  azithromycin (ZITHROMAX) tablet 500 mg  Status:  Discontinued       "Followed by" Linked Group Details   500 mg Oral Daily 01/06/21 0937 01/06/21 0944   01/06/21 0800  ceFEPIme (MAXIPIME) 2 g in sodium chloride 0.9 % 100 mL IVPB        2 g 200 mL/hr over 30 Minutes Intravenous Every 8 hours 01/05/21 2308     01/06/21 0000  vancomycin (VANCOREADY) IVPB 1500 mg/300 mL        1,500 mg 150 mL/hr over 120 Minutes Intravenous STAT 01/05/21 2307 01/06/21 0305   01/06/21 0000  ceFEPIme  (MAXIPIME) 2 g in sodium chloride 0.9 % 100 mL IVPB        2 g 200 mL/hr over 30 Minutes Intravenous STAT 01/05/21 2307 01/06/21 0035   01/05/21 1430  cefTRIAXone (ROCEPHIN) 1 g in sodium chloride 0.9 % 100 mL IVPB        1 g 200 mL/hr over 30 Minutes Intravenous  Once 01/05/21 1418 01/05/21 1552   01/05/21 1430  azithromycin (ZITHROMAX) 500 mg in sodium chloride 0.9 % 250 mL IVPB        500 mg 250 mL/hr over 60 Minutes Intravenous  Once 01/05/21 1418 01/05/21 1702        Subjective   Very short of breath this morning.  Reportedly did not receive his inhalers for the past 12 hours or so.  Symptoms improved after he received his a.m. inhalers.  Objective   Vitals:   01/05/21 2049 01/06/21 0113 01/06/21 0501 01/06/21 1350  BP: (!) 161/79 (!) 152/72 (!) 153/68 (!) 142/60  Pulse: 81 80 75 83  Resp: 20 20 18  (!) 21  Temp: 98.2 F (36.8 C) 98.3 F (36.8 C) 98.8 F (37.1 C) 98.3 F (36.8 C)  TempSrc: Oral Oral Oral Oral  SpO2: 99% 100% 99% 100%  Weight:      Height:        Intake/Output Summary (Last 24 hours) at 01/06/2021 1742 Last data filed at 01/06/2021 1501 Gross per 24 hour  Intake 2084.84 ml  Output 1900 ml  Net 184.84 ml   Filed Weights   01/05/21 1245  Weight: 68.5 kg    Examination:  Physical Exam Vitals and nursing note reviewed.  Constitutional:      Appearance: Normal appearance.  HENT:     Head: Normocephalic and atraumatic.  Eyes:     Conjunctiva/sclera: Conjunctivae normal.  Cardiovascular:     Rate and Rhythm: Normal rate and regular rhythm.  Pulmonary:     Effort: Tachypnea present.     Comments: "tight" breath sounds Abdominal:     General: Abdomen is flat.     Palpations: Abdomen is soft.  Musculoskeletal:        General: No swelling or tenderness.  Skin:    Coloration: Skin is not jaundiced or pale.  Neurological:     Mental Status: He is alert. Mental status is at baseline.  Psychiatric:        Mood and Affect: Mood normal.         Behavior: Behavior normal.     Data Reviewed: I have personally reviewed following labs and imaging studies  CBC: Recent Labs  Lab 01/05/21 1255 01/06/21 0708  WBC 23.5* 14.0*  HGB 13.7 11.3*  HCT 45.0 38.1*  MCV 80.6 82.6  PLT 354 643   Basic Metabolic Panel: Recent Labs  Lab 01/05/21 1255 01/06/21 0259  NA 137 141  K 4.1 4.0  CL 91* 98  CO2 32 33*  GLUCOSE 141* 92  BUN 16 17  CREATININE 0.85 0.77  CALCIUM 8.9 8.4*   GFR: Estimated Creatinine Clearance: 72 mL/min (by C-G formula based on SCr of 0.77 mg/dL). Liver Function Tests: Recent Labs  Lab 01/05/21 1255 01/06/21 0259  AST 27 19  ALT 39 33  ALKPHOS 62 53  BILITOT 0.6 0.6  PROT 7.5 5.9*  ALBUMIN 3.7 2.9*   No results for input(s): LIPASE, AMYLASE in the last 168 hours. No results for input(s): AMMONIA in the last 168 hours. Coagulation Profile: No results for input(s): INR, PROTIME in the last 168 hours. Cardiac Enzymes: No results for input(s): CKTOTAL, CKMB, CKMBINDEX, TROPONINI in the last 168 hours. BNP (last 3 results) No results for input(s): PROBNP in the last 8760 hours. HbA1C: No results for input(s): HGBA1C in the last 72 hours. CBG: Recent Labs  Lab 01/05/21 2259  GLUCAP 100*   Lipid Profile: No results for input(s): CHOL, HDL, LDLCALC, TRIG, CHOLHDL, LDLDIRECT in the last 72 hours. Thyroid Function Tests: No results for input(s): TSH, T4TOTAL, FREET4, T3FREE, THYROIDAB in the last 72 hours. Anemia Panel: No results for input(s): VITAMINB12, FOLATE, FERRITIN, TIBC, IRON, RETICCTPCT in the last 72 hours. Sepsis Labs: No results for input(s): PROCALCITON, LATICACIDVEN in the last 168 hours.  Recent Results (from the past 240 hour(s))  Culture, blood (routine x 2) Call MD if unable to obtain prior to antibiotics being given     Status: None (Preliminary result)   Collection Time: 01/05/21 10:35 PM   Specimen: Left Antecubital; Blood  Result Value Ref Range Status   Specimen  Description   Final    LEFT ANTECUBITAL Performed at [facility] [address] [place], [place] [address]    Special Requests   Final    Blood Culture results may not be optimal due to an excessive volume of blood received in culture bottles BOTTLES DRAWN AEROBIC AND ANAEROBIC   Culture   Final    NO GROWTH < 12 HOURS Performed at [facility], [place] [address], [place], [place] [address]    Report Status PENDING  Incomplete  Culture, blood (routine x 2) Call MD if unable to obtain prior to antibiotics being given     Status: None (Preliminary result)   Collection Time: 01/05/21 10:35 PM   Specimen: Left Antecubital; Blood  Result Value Ref Range Status   Specimen Description   Final    LEFT ANTECUBITAL Performed at [facility] [address], [place], [place] [address]    Special Requests   Final    BOTTLES DRAWN AEROBIC AND ANAEROBIC Blood Culture results may not be optimal due to an excessive volume of blood received in culture bottles Performed at [facility] [address], [place], [place] [address]    Culture   Final    NO GROWTH < 12 HOURS Performed at [facility] [address], [place],  [address]    Report Status PENDING  Incomplete  MRSA PCR Screening     Status: None   Collection Time: 01/06/21  6:00 AM   Specimen: Nasal Mucosa; Nasopharyngeal  Result Value Ref Range Status   MRSA by PCR NEGATIVE NEGATIVE Final  Comment:        The GeneXpert MRSA Assay (FDA approved for NASAL specimens only), is one component of a comprehensive MRSA colonization surveillance program. It is not intended to diagnose MRSA infection nor to guide or monitor treatment for MRSA infections. Performed at [facility], [place] [address] [place], [place] [address]          Radiology Studies: DG Chest 2 View  Result Date: 01/05/2021 CLINICAL DATA:  Syncope and fall today.   Shortness of breath. EXAM: CHEST - 2 VIEW COMPARISON:  Single-view of the chest 12/24/2020 and 12/07/2020. CT chest 03/27/2020. FINDINGS: The lungs are emphysematous. There is a hazy focus of airspace opacity in the lingula which is new since the prior exams. The right lung is clear. No pneumothorax or pleural effusion. Heart size is normal. Aortic atherosclerosis. No acute or focal bony abnormality. IMPRESSION: Hazy airspace opacity in lingula worrisome for pneumonia. Aortic Atherosclerosis (ICD10-I70.0) and Emphysema (ICD10-J43.9). Electronically Signed   By: Inge Rise M.D.   On: 01/05/2021 13:44   CT Head Wo Contrast  Result Date: 01/05/2021 CLINICAL DATA:  Syncope with fall. EXAM: CT HEAD WITHOUT CONTRAST CT CERVICAL SPINE WITHOUT CONTRAST TECHNIQUE: Multidetector CT imaging of the head and cervical spine was performed following the standard protocol without intravenous contrast. Multiplanar CT image reconstructions of the cervical spine were also generated. COMPARISON:  Head CT 03/27/2020. FINDINGS: CT HEAD FINDINGS Brain: There is no evidence for acute hemorrhage, hydrocephalus, mass lesion, or abnormal extra-axial fluid collection. No definite CT evidence for acute infarction. Diffuse loss of parenchymal volume is consistent with atrophy. Patchy low attenuation in the deep hemispheric and periventricular white matter is nonspecific, but likely reflects chronic microvascular ischemic demyelination. Vascular: No hyperdense vessel or unexpected calcification. Skull: No evidence for fracture. No worrisome lytic or sclerotic lesion. Sinuses/Orbits: The visualized paranasal sinuses and mastoid air cells are clear. Visualized portions of the globes and intraorbital fat are unremarkable. Other: Soft tissue contusion noted left parietal scalp. CT CERVICAL SPINE FINDINGS Alignment: Straightening of normal cervical lordosis. No subluxation. Skull base and vertebrae: No acute fracture. No primary bone lesion  or focal pathologic process. Soft tissues and spinal canal: No prevertebral fluid or swelling. No visible canal hematoma. Disc levels: Loss of disc height noted C2-3 and C5-6. Diffuse facet degeneration noted bilaterally. Upper chest: Centrilobular emphysema with calcific pleuroparenchymal scarring in both apices. Other: None. IMPRESSION: 1. No acute intracranial abnormality. Atrophy with chronic small vessel white matter ischemic disease. 2. Soft tissue contusion left parietal scalp. 3. Degenerative changes in the cervical spine without fracture. 4. Loss of cervical lordosis. This can be related to patient positioning, muscle spasm or soft tissue injury. Electronically Signed   By: Misty Stanley M.D.   On: 01/05/2021 13:44   CT Cervical Spine Wo Contrast  Result Date: 01/05/2021 CLINICAL DATA:  Syncope with fall. EXAM: CT HEAD WITHOUT CONTRAST CT CERVICAL SPINE WITHOUT CONTRAST TECHNIQUE: Multidetector CT imaging of the head and cervical spine was performed following the standard protocol without intravenous contrast. Multiplanar CT image reconstructions of the cervical spine were also generated. COMPARISON:  Head CT 03/27/2020. FINDINGS: CT HEAD FINDINGS Brain: There is no evidence for acute hemorrhage, hydrocephalus, mass lesion, or abnormal extra-axial fluid collection. No definite CT evidence for acute infarction. Diffuse loss of parenchymal volume is consistent with atrophy. Patchy low attenuation in the deep hemispheric and periventricular white matter is nonspecific, but likely reflects chronic microvascular ischemic demyelination. Vascular: No hyperdense vessel or  unexpected calcification. Skull: No evidence for fracture. No worrisome lytic or sclerotic lesion. Sinuses/Orbits: The visualized paranasal sinuses and mastoid air cells are clear. Visualized portions of the globes and intraorbital fat are unremarkable. Other: Soft tissue contusion noted left parietal scalp. CT CERVICAL SPINE FINDINGS  Alignment: Straightening of normal cervical lordosis. No subluxation. Skull base and vertebrae: No acute fracture. No primary bone lesion or focal pathologic process. Soft tissues and spinal canal: No prevertebral fluid or swelling. No visible canal hematoma. Disc levels: Loss of disc height noted C2-3 and C5-6. Diffuse facet degeneration noted bilaterally. Upper chest: Centrilobular emphysema with calcific pleuroparenchymal scarring in both apices. Other: None. IMPRESSION: 1. No acute intracranial abnormality. Atrophy with chronic small vessel white matter ischemic disease. 2. Soft tissue contusion left parietal scalp. 3. Degenerative changes in the cervical spine without fracture. 4. Loss of cervical lordosis. This can be related to patient positioning, muscle spasm or soft tissue injury. Electronically Signed   By: Misty Stanley M.D.   On: 01/05/2021 13:44        Scheduled Meds: . apixaban  5 mg Oral BID  . atorvastatin  40 mg Oral QPC supper  . azithromycin  250 mg Oral Daily  . bisoprolol  5 mg Oral Daily  . diltiazem  120 mg Oral Daily  . furosemide  40 mg Oral BID  . mometasone-formoterol  2 puff Inhalation BID  . predniSONE  40 mg Oral Q breakfast  . umeclidinium bromide  1 puff Inhalation Daily   Continuous Infusions: . sodium chloride Stopped (01/05/21 1724)  . sodium chloride 100 mL/hr at 01/06/21 1241  . ceFEPime (MAXIPIME) IV 2 g (01/06/21 1530)     Time spent: 35 minutes with over 50% of the time coordinating the patient's care    Harold Hedge, DO [facility]   Call night coverage person covering after 7pm

## 2021-01-06 NOTE — Progress Notes (Signed)
Pharmacy Antibiotic Note  Paul [name] is a 75 y.o. male admitted on 01/05/2021 with pneumonia.  Pharmacy has been consulted for Vancomycin and Cefepime dosing.  Plan: Vancomycin 1500mg  IV q24h Cefepime 2g IV q8h Monitor renal function, cultures, clinical course  Height: 5\' 6"  (167.6 cm) Weight: 68.5 kg (151 lb) IBW/kg (Calculated) : 63.8  Temp (24hrs), Avg:98.1 F (36.7 C), Min:97.9 F (36.6 C), Max:98.2 F (36.8 C)  Recent Labs  Lab 01/05/21 1255  WBC 23.5*  CREATININE 0.85    Estimated Creatinine Clearance: 67.8 mL/min (by C-G formula based on SCr of 0.85 mg/dL).    Allergies  Allergen Reactions  . Daliresp [Roflumilast]     Nausea, vomiting and diarrhea.  . Alendronate Other (See Comments)    Jittery/nervous    Antimicrobials this admission: 1/14 Azithromycin x 1 1/14 Ceftriaxone x 1 1/15 Cefepime >> 1/15 Vancomycin >>  Dose adjustments this admission: --  Microbiology results: 1/14 BCx: sent 1/14 Strep pneumo urinary antigen: in process  1/15 MRSA PCR: ordered  Thank you for allowing pharmacy to be a part of this patient's care.   Lindell Spar, PharmD, BCPS Clinical Pharmacist  01/06/2021 12:00 AM

## 2021-01-06 NOTE — Progress Notes (Incomplete)
Pharmacy Antibiotic Note  Paul [name] is a 75 y.o. male admitted on 01/05/2021 with pneumonia.  Pharmacy has been consulted for Vancomycin and Cefepime dosing.  Plan: {Assessment:21075}  Height: 5\' 6"  (167.6 cm) Weight: 68.5 kg (151 lb) IBW/kg (Calculated) : 63.8  Temp (24hrs), Avg:98.1 F (36.7 C), Min:97.9 F (36.6 C), Max:98.2 F (36.8 C)  Recent Labs  Lab 01/05/21 1255  WBC 23.5*  CREATININE 0.85    Estimated Creatinine Clearance: 67.8 mL/min (by C-G formula based on SCr of 0.85 mg/dL).    Allergies  Allergen Reactions  . Daliresp [Roflumilast]     Nausea, vomiting and diarrhea.  . Alendronate Other (See Comments)    Jittery/nervous    Antimicrobials this admission: *** *** >> *** *** *** >> ***  Dose adjustments this admission: ***  Microbiology results: *** BCx: *** *** UCx: ***  *** Sputum: ***  *** MRSA PCR: ***  Thank you for allowing pharmacy to be a part of this patient's care.  Luiz Ochoa 01/06/2021 12:00 AM

## 2021-01-07 MED ORDER — MELATONIN 5 MG PO TABS
5.0000 mg | ORAL_TABLET | Freq: Every day | ORAL | Status: DC
  Administered 2021-01-07 – 2021-01-08 (×2): 5 mg via ORAL
  Filled 2021-01-07 (×2): qty 1

## 2021-01-07 NOTE — Progress Notes (Signed)
SATURATION QUALIFICATIONS: (This note is used to comply with regulatory documentation for home oxygen)  Patient Saturations on Room Air at Rest = 89%  Patient Saturations on Room Air while Ambulating = 84%  Patient Saturations on 2 Liters of oxygen while Ambulating = 88%  Please briefly explain why patient needs home oxygen:

## 2021-01-07 NOTE — Progress Notes (Signed)
    Mr. Perrier is currently enrolled in [facility] ([place] provided through [place]). We will continue nurse and SW support upon discharge.  Please call [facility] at [phone] for any questions.  Jeanne Ivan, RN, MSN

## 2021-01-07 NOTE — Progress Notes (Signed)
PROGRESS NOTE    Paul [name]    Code Status: Full Code  GYI:[id number] DOB: 06/15/1946 DOA: 01/05/2021 LOS: 2 days  PCP: [name], MD CC:  Chief Complaint  Patient presents with  . Loss of Consciousness  . Shortness of Breath       Hospital Summary   This is a 75 year old male with history of diabetes, hypertension, COPD on chronic steroids and as needed oxygen, atrial fibrillation on Eliquis, COVID-19 positive on 12/24/2020 who presented to [facility] ED on 1/14 s/p syncopal episode with subsequent posterior head laceration as well as shortness of breath.  Recent hospitalization from 1/3-1/6 for acute hypoxic respiratory failure secondary to COVID-19 and completed remdesivir and was discharged with a steroid taper at the time.  In the ED CT head and cervical spine unremarkable, CXR with lingular pneumonia.  He was started on vancomycin and cefepime.     A & P   Principal Problem:   Lingular pneumonia Active Problems:   COPD GOLD  III    Acute on chronic respiratory failure with hypoxia (HCC)   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HCC)   Essential hypertension   Syncope   COPD exacerbation ([place])   COVID-19   1. COPD exacerbation secondary to lingular pneumonia/HAP a. MRSA nares negative, discontinued vancomycin b. Significant symptomatic improvement c. Day 3/5 antibiotics, currently on Cefepime and Azithromycin d. Continue Incruse Ellipta and Dulera (modified from home inhalers per formulary) e. Continue prednisone 40 mg daily. He will need a steroid taper at discharge f. Will likely need continuous O2 at discharge (PRN at baseline) g. As needed albuterol inhaler  2. Syncope with resultant posterior scalp laceration, Likely secondary to pneumonia and dehydration a. Syncope likely secondary orthostasis and/or from pneumonia/hypoxia b. On Eliquis, CT head negative c. Laceration bandaged in the ED d. PT eval e. Telemetry f. Treatment as above  3. Type 2  diabetes a. Continue sliding scale  4. Hypertension a. Continue home regimen  5. COVID-19 a. Diagnosed 12/24/2020, s/p 5 days remdesivir and steroid taper b. COVID-19 symptoms have likely resolved and symptoms most likely related to pneumonia as above  6. Paroxysmal A. Fib  chronic HFpEF, not in exacerbation a. Continue home bisoprolol, diltiazem, Eliquis, Lasix b. Telemetry  7. Hyperlipidemia a. Continue statin  DVT prophylaxis:  apixaban (ELIQUIS) tablet 5 mg   Family Communication: Patient updated at bedside  Disposition Plan: Likely discharge tomorrow after additional day of IV antibiotics Status is: Inpatient  Remains inpatient appropriate because:IV treatments appropriate due to intensity of illness or inability to take PO and Inpatient level of care appropriate due to severity of illness   Dispo: The patient is from: Home              Anticipated d/c is to: Home              Anticipated d/c date is: 1 day              Patient currently is not medically stable to d/c.           Pressure injury documentation    None  Consultants  None  Procedures  None  Antibiotics   Anti-infectives (From admission, onward)   Start     Dose/Rate Route Frequency Ordered Stop   01/07/21 1000  azithromycin (ZITHROMAX) tablet 250 mg  Status:  Discontinued       "Followed by" Linked Group Details   250 mg Oral Daily 01/06/21 0937 01/06/21 0944  01/07/21 0000  vancomycin (VANCOREADY) IVPB 1500 mg/300 mL  Status:  Discontinued        1,500 mg 150 mL/hr over 120 Minutes Intravenous Every 24 hours 01/06/21 0004 01/06/21 0937   01/06/21 1200  azithromycin (ZITHROMAX) tablet 250 mg        250 mg Oral Daily 01/06/21 0943 01/10/21 0959   01/06/21 1030  azithromycin (ZITHROMAX) tablet 500 mg  Status:  Discontinued       "Followed by" Linked Group Details   500 mg Oral Daily 01/06/21 0937 01/06/21 0944   01/06/21 0800  ceFEPIme (MAXIPIME) 2 g in sodium chloride 0.9 % 100 mL  IVPB        2 g 200 mL/hr over 30 Minutes Intravenous Every 8 hours 01/05/21 2308     01/06/21 0000  vancomycin (VANCOREADY) IVPB 1500 mg/300 mL        1,500 mg 150 mL/hr over 120 Minutes Intravenous STAT 01/05/21 2307 01/06/21 0305   01/06/21 0000  ceFEPIme (MAXIPIME) 2 g in sodium chloride 0.9 % 100 mL IVPB        2 g 200 mL/hr over 30 Minutes Intravenous STAT 01/05/21 2307 01/06/21 0035   01/05/21 1430  cefTRIAXone (ROCEPHIN) 1 g in sodium chloride 0.9 % 100 mL IVPB        1 g 200 mL/hr over 30 Minutes Intravenous  Once 01/05/21 1418 01/05/21 1552   01/05/21 1430  azithromycin (ZITHROMAX) 500 mg in sodium chloride 0.9 % 250 mL IVPB        500 mg 250 mL/hr over 60 Minutes Intravenous  Once 01/05/21 1418 01/05/21 1702        Subjective   Significant improvement in symptoms today. States that his O2 sat drops with exertion on occasion. No chest pain, nausea vomiting or any overnight events. No further syncope.  Objective   Vitals:   01/06/21 2148 01/07/21 0154 01/07/21 0617 01/07/21 1316  BP: 137/65 133/66 134/64 126/62  Pulse: 91 87 77 78  Resp: 18 20    Temp: 98.1 F (36.7 C) 98.2 F (36.8 C) 98 F (36.7 C) 98.1 F (36.7 C)  TempSrc: Oral Oral Oral Oral  SpO2: 97% 98% 99% 98%  Weight:      Height:        Intake/Output Summary (Last 24 hours) at 01/07/2021 1600 Last data filed at 01/06/2021 1958 Gross per 24 hour  Intake 766.64 ml  Output 500 ml  Net 266.64 ml   Filed Weights   01/05/21 1245  Weight: 68.5 kg    Examination:  Physical Exam Vitals and nursing note reviewed.  Constitutional:      Appearance: Normal appearance.  HENT:     Head: Normocephalic and atraumatic.  Eyes:     Conjunctiva/sclera: Conjunctivae normal.  Cardiovascular:     Rate and Rhythm: Normal rate and regular rhythm.  Pulmonary:     Effort: Pulmonary effort is normal. No tachypnea.  Abdominal:     General: Abdomen is flat.     Palpations: Abdomen is soft.  Musculoskeletal:         General: No swelling or tenderness.  Skin:    Coloration: Skin is not jaundiced or pale.  Neurological:     Mental Status: He is alert. Mental status is at baseline.  Psychiatric:        Mood and Affect: Mood normal.        Behavior: Behavior normal.     Data Reviewed: I have personally reviewed following  labs and imaging studies  CBC: Recent Labs  Lab 01/05/21 1255 01/06/21 0708  WBC 23.5* 14.0*  HGB 13.7 11.3*  HCT 45.0 38.1*  MCV 80.6 82.6  PLT 354 628   Basic Metabolic Panel: Recent Labs  Lab 01/05/21 1255 01/06/21 0259  NA 137 141  K 4.1 4.0  CL 91* 98  CO2 32 33*  GLUCOSE 141* 92  BUN 16 17  CREATININE 0.85 0.77  CALCIUM 8.9 8.4*   GFR: Estimated Creatinine Clearance: 72 mL/min (by C-G formula based on SCr of 0.77 mg/dL). Liver Function Tests: Recent Labs  Lab 01/05/21 1255 01/06/21 0259  AST 27 19  ALT 39 33  ALKPHOS 62 53  BILITOT 0.6 0.6  PROT 7.5 5.9*  ALBUMIN 3.7 2.9*   No results for input(s): LIPASE, AMYLASE in the last 168 hours. No results for input(s): AMMONIA in the last 168 hours. Coagulation Profile: No results for input(s): INR, PROTIME in the last 168 hours. Cardiac Enzymes: No results for input(s): CKTOTAL, CKMB, CKMBINDEX, TROPONINI in the last 168 hours. BNP (last 3 results) No results for input(s): PROBNP in the last 8760 hours. HbA1C: No results for input(s): HGBA1C in the last 72 hours. CBG: Recent Labs  Lab 01/05/21 2259  GLUCAP 100*   Lipid Profile: No results for input(s): CHOL, HDL, LDLCALC, TRIG, CHOLHDL, LDLDIRECT in the last 72 hours. Thyroid Function Tests: No results for input(s): TSH, T4TOTAL, FREET4, T3FREE, THYROIDAB in the last 72 hours. Anemia Panel: No results for input(s): VITAMINB12, FOLATE, FERRITIN, TIBC, IRON, RETICCTPCT in the last 72 hours. Sepsis Labs: No results for input(s): PROCALCITON, LATICACIDVEN in the last 168 hours.  Recent Results (from the past 240 hour(s))  Culture, blood  (routine x 2) Call MD if unable to obtain prior to antibiotics being given     Status: None (Preliminary result)   Collection Time: 01/05/21 10:35 PM   Specimen: Left Antecubital; Blood  Result Value Ref Range Status   Specimen Description   Final    LEFT ANTECUBITAL Performed at [facility] [address], [place], [place] [address]    Special Requests   Final    Blood Culture results may not be optimal due to an excessive volume of blood received in culture bottles BOTTLES DRAWN AEROBIC AND ANAEROBIC   Culture   Final    NO GROWTH 1 DAY Performed at [facility], [place] [address], [place], [place] [address]    Report Status PENDING  Incomplete  Culture, blood (routine x 2) Call MD if unable to obtain prior to antibiotics being given     Status: None (Preliminary result)   Collection Time: 01/05/21 10:35 PM   Specimen: Left Antecubital; Blood  Result Value Ref Range Status   Specimen Description   Final    LEFT ANTECUBITAL Performed at [facility] [address], [place], [place] [address]    Special Requests   Final    BOTTLES DRAWN AEROBIC AND ANAEROBIC Blood Culture results may not be optimal due to an excessive volume of blood received in culture bottles Performed at [facility] [address] [place], [place] [address]    Culture   Final    NO GROWTH 1 DAY Performed at [facility], [place] [address] [place], [place] [address]    Report Status PENDING  Incomplete  MRSA PCR Screening     Status: None   Collection Time: 01/06/21  6:00 AM  Specimen: Nasal Mucosa; Nasopharyngeal  Result Value Ref Range Status   MRSA by PCR NEGATIVE NEGATIVE Final    Comment:        The GeneXpert MRSA Assay (FDA approved for NASAL specimens only), is one component of a comprehensive MRSA colonization surveillance program. It is not intended to diagnose MRSA infection nor to guide or monitor treatment for MRSA  infections. Performed at [facility], [place] [address], [place], [place] [address]          Radiology Studies: No results found.      Scheduled Meds: . apixaban  5 mg Oral BID  . atorvastatin  40 mg Oral QPC supper  . azithromycin  250 mg Oral Daily  . bisoprolol  5 mg Oral Daily  . diltiazem  120 mg Oral Daily  . furosemide  40 mg Oral BID  . mometasone-formoterol  2 puff Inhalation BID  . predniSONE  40 mg Oral Q breakfast  . umeclidinium bromide  1 puff Inhalation Daily   Continuous Infusions: . sodium chloride Stopped (01/05/21 1724)  . sodium chloride 100 mL/hr at 01/07/21 1142  . ceFEPime (MAXIPIME) IV 2 g (01/07/21 1010)     Time spent: 20 minutes with over 50% of the time coordinating the patient's care    Harold Hedge, DO [facility]   Call night coverage person covering after 7pm

## 2021-01-07 NOTE — Plan of Care (Signed)
  Problem: Education: Goal: Knowledge of General Education information will improve Description: Including pain rating scale, medication(s)/side effects and non-pharmacologic comfort measures Outcome: Progressing   Problem: Health Behavior/Discharge Planning: Goal: Ability to manage health-related needs will improve Outcome: Progressing   Problem: Clinical Measurements: Goal: Ability to maintain clinical measurements within normal limits will improve Outcome: Progressing Goal: Will remain free from infection Outcome: Progressing Goal: Diagnostic test results will improve Outcome: Progressing Goal: Respiratory complications will improve Outcome: Progressing Goal: Cardiovascular complication will be avoided Outcome: Progressing   Problem: Activity: Goal: Risk for activity intolerance will decrease Outcome: Progressing   Problem: Nutrition: Goal: Adequate nutrition will be maintained Outcome: Progressing   Problem: Coping: Goal: Level of anxiety will decrease Outcome: Progressing   Problem: Elimination: Goal: Will not experience complications related to bowel motility Outcome: Progressing Goal: Will not experience complications related to urinary retention Outcome: Progressing   Problem: Pain Managment: Goal: General experience of comfort will improve Outcome: Progressing   Problem: Safety: Goal: Ability to remain free from injury will improve Outcome: Progressing   Problem: Skin Integrity: Goal: Risk for impaired skin integrity will decrease Outcome: Progressing   Problem: Respiratory: Goal: Will maintain a patent airway Outcome: Progressing Goal: Complications related to the disease process, condition or treatment will be avoided or minimized Outcome: Progressing

## 2021-01-08 DIAGNOSIS — H8112 Benign paroxysmal vertigo, left ear: Secondary | ICD-10-CM

## 2021-01-08 NOTE — Progress Notes (Signed)
PROGRESS NOTE    Paul [name]    Code Status: Full Code  ET:[id number] DOB: 03-Jun-1946 DOA: 01/05/2021 LOS: 3 days  PCP: [name], MD CC:  Chief Complaint  Patient presents with  . Loss of Consciousness  . Shortness of Breath       Hospital Summary   This is a 75 year old male with history of diabetes, hypertension, COPD on chronic steroids and as needed oxygen, atrial fibrillation on Eliquis, COVID-19 positive on 12/24/2020 who presented to [facility] ED on 1/14 s/p syncopal episode with subsequent posterior head laceration as well as shortness of breath.  Recent hospitalization from 1/3-1/6 for acute hypoxic respiratory failure secondary to COVID-19 and completed remdesivir and was discharged with a steroid taper at the time.  In the ED CT head and cervical spine unremarkable, CXR with lingular pneumonia.  He was started on vancomycin and cefepime.     A & P   Principal Problem:   Lingular pneumonia Active Problems:   COPD GOLD  III    Acute on chronic respiratory failure with hypoxia (HCC)   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HCC)   Essential hypertension   Syncope   COPD exacerbation ([place])   COVID-19   1. COPD exacerbation secondary to lingular pneumonia/HAP, improved a. Day 4/5 antibiotics, currently on Cefepime and Azithromycin b. Continue Incruse Ellipta and Dulera (modified from home inhalers per formulary) c. Continue prednisone 40 mg daily. He will need a steroid taper at discharge d. Will likely need continuous O2 at discharge (PRN at baseline) e. As needed albuterol inhaler  2. BPPV a. PT consulted b. Recommend outpatient follow up c. Holding meclizine due to sycnope  3. Syncope with resultant posterior scalp laceration, Likely secondary to pneumonia and dehydration/orthostasis a. Syncope likely secondary orthostasis and/or from pneumonia/hypoxia b. On Eliquis, CT head negative c. Laceration bandaged in the ED d. Check orthostatics e. PT eval -  patient declined f. Telemetry  4. Type 2 diabetes a. Continue sliding scale  5. Hypertension a. Continue home regimen  6. COVID-19 a. Diagnosed 12/24/2020, s/p 5 days remdesivir and steroid taper b. COVID-19 symptoms have likely resolved and symptoms most likely related to pneumonia as above  7. Paroxysmal A. Fib  chronic HFpEF, not in exacerbation a. Continue home bisoprolol, diltiazem, Eliquis, Lasix b. Telemetry  8. Hyperlipidemia a. Continue statin  DVT prophylaxis:  apixaban (ELIQUIS) tablet 5 mg   Family Communication: Patient updated at bedside  Disposition Plan: Likely discharge tomorrow after additional day of IV antibiotics and safe DC plan. Patient lives far away and will have a ride tomorrow Status is: Inpatient  Remains inpatient appropriate because:Unsafe d/c plan   Dispo: The patient is from: Home              Anticipated d/c is to: Home              Anticipated d/c date is: 1 day              Patient currently is not medically stable to d/c.           Pressure injury documentation    None  Consultants  None  Procedures  None  Antibiotics   Anti-infectives (From admission, onward)   Start     Dose/Rate Route Frequency Ordered Stop   01/07/21 1000  azithromycin (ZITHROMAX) tablet 250 mg  Status:  Discontinued       "Followed by" Linked Group Details   250 mg Oral Daily 01/06/21  1937 01/06/21 0944   01/07/21 0000  vancomycin (VANCOREADY) IVPB 1500 mg/300 mL  Status:  Discontinued        1,500 mg 150 mL/hr over 120 Minutes Intravenous Every 24 hours 01/06/21 0004 01/06/21 0937   01/06/21 1200  azithromycin (ZITHROMAX) tablet 250 mg        250 mg Oral Daily 01/06/21 0943 01/10/21 0959   01/06/21 1030  azithromycin (ZITHROMAX) tablet 500 mg  Status:  Discontinued       "Followed by" Linked Group Details   500 mg Oral Daily 01/06/21 0937 01/06/21 0944   01/06/21 0800  ceFEPIme (MAXIPIME) 2 g in sodium chloride 0.9 % 100 mL IVPB        2  g 200 mL/hr over 30 Minutes Intravenous Every 8 hours 01/05/21 2308     01/06/21 0000  vancomycin (VANCOREADY) IVPB 1500 mg/300 mL        1,500 mg 150 mL/hr over 120 Minutes Intravenous STAT 01/05/21 2307 01/06/21 0305   01/06/21 0000  ceFEPIme (MAXIPIME) 2 g in sodium chloride 0.9 % 100 mL IVPB        2 g 200 mL/hr over 30 Minutes Intravenous STAT 01/05/21 2307 01/06/21 0035   01/05/21 1430  cefTRIAXone (ROCEPHIN) 1 g in sodium chloride 0.9 % 100 mL IVPB        1 g 200 mL/hr over 30 Minutes Intravenous  Once 01/05/21 1418 01/05/21 1552   01/05/21 1430  azithromycin (ZITHROMAX) 500 mg in sodium chloride 0.9 % 250 mL IVPB        500 mg 250 mL/hr over 60 Minutes Intravenous  Once 01/05/21 1418 01/05/21 1702        Subjective   Feels much improved but dizzy when laying on his left. Otherwise feels better but short of breath on ambulation. No other complaints or overnight events.   Objective   Vitals:   01/07/21 1316 01/07/21 2104 01/08/21 0626 01/08/21 1448  BP: 126/62 136/66 133/69 123/67  Pulse: 78 73 75 84  Resp:  20 20 20   Temp: 98.1 F (36.7 C) 98 F (36.7 C) 98.2 F (36.8 C) 98.8 F (37.1 C)  TempSrc: Oral Oral Oral Oral  SpO2: 98% 95% 98% 90%  Weight:      Height:        Intake/Output Summary (Last 24 hours) at 01/08/2021 1903 Last data filed at 01/08/2021 0500 Gross per 24 hour  Intake -  Output 1250 ml  Net -1250 ml   Filed Weights   01/05/21 1245  Weight: 68.5 kg    Examination:  Physical Exam Vitals and nursing note reviewed.  Constitutional:      Appearance: Normal appearance.  HENT:     Head: Normocephalic and atraumatic.  Eyes:     Conjunctiva/sclera: Conjunctivae normal.  Cardiovascular:     Rate and Rhythm: Normal rate and regular rhythm.  Pulmonary:     Effort: Pulmonary effort is normal.     Breath sounds: No wheezing.  Abdominal:     General: Abdomen is flat.     Palpations: Abdomen is soft.  Musculoskeletal:        General: No  swelling or tenderness.     Right lower leg: No tenderness. No edema.     Left lower leg: No tenderness. No edema.  Skin:    Coloration: Skin is not jaundiced or pale.  Neurological:     Mental Status: He is alert. Mental status is at baseline.  Comments: Dizziness reproducible with turning head to left, not on right No nystagmus noted  Psychiatric:        Mood and Affect: Mood normal.        Behavior: Behavior normal.     Data Reviewed: I have personally reviewed following labs and imaging studies  CBC: Recent Labs  Lab 01/05/21 1255 01/06/21 0708  WBC 23.5* 14.0*  HGB 13.7 11.3*  HCT 45.0 38.1*  MCV 80.6 82.6  PLT 354 563   Basic Metabolic Panel: Recent Labs  Lab 01/05/21 1255 01/06/21 0259  NA 137 141  K 4.1 4.0  CL 91* 98  CO2 32 33*  GLUCOSE 141* 92  BUN 16 17  CREATININE 0.85 0.77  CALCIUM 8.9 8.4*   GFR: Estimated Creatinine Clearance: 72 mL/min (by C-G formula based on SCr of 0.77 mg/dL). Liver Function Tests: Recent Labs  Lab 01/05/21 1255 01/06/21 0259  AST 27 19  ALT 39 33  ALKPHOS 62 53  BILITOT 0.6 0.6  PROT 7.5 5.9*  ALBUMIN 3.7 2.9*   No results for input(s): LIPASE, AMYLASE in the last 168 hours. No results for input(s): AMMONIA in the last 168 hours. Coagulation Profile: No results for input(s): INR, PROTIME in the last 168 hours. Cardiac Enzymes: No results for input(s): CKTOTAL, CKMB, CKMBINDEX, TROPONINI in the last 168 hours. BNP (last 3 results) No results for input(s): PROBNP in the last 8760 hours. HbA1C: No results for input(s): HGBA1C in the last 72 hours. CBG: Recent Labs  Lab 01/05/21 2259  GLUCAP 100*   Lipid Profile: No results for input(s): CHOL, HDL, LDLCALC, TRIG, CHOLHDL, LDLDIRECT in the last 72 hours. Thyroid Function Tests: No results for input(s): TSH, T4TOTAL, FREET4, T3FREE, THYROIDAB in the last 72 hours. Anemia Panel: No results for input(s): VITAMINB12, FOLATE, FERRITIN, TIBC, IRON, RETICCTPCT  in the last 72 hours. Sepsis Labs: No results for input(s): PROCALCITON, LATICACIDVEN in the last 168 hours.  Recent Results (from the past 240 hour(s))  Culture, blood (routine x 2) Call MD if unable to obtain prior to antibiotics being given     Status: None (Preliminary result)   Collection Time: 01/05/21 10:35 PM   Specimen: Left Antecubital; Blood  Result Value Ref Range Status   Specimen Description   Final    LEFT ANTECUBITAL Performed at [facility] [address], [place], [place] [address]    Special Requests   Final    Blood Culture results may not be optimal due to an excessive volume of blood received in culture bottles BOTTLES DRAWN AEROBIC AND ANAEROBIC   Culture   Final    NO GROWTH 2 DAYS Performed at [facility], [place] [address] [place], [place] [address]    Report Status PENDING  Incomplete  Culture, blood (routine x 2) Call MD if unable to obtain prior to antibiotics being given     Status: None (Preliminary result)   Collection Time: 01/05/21 10:35 PM   Specimen: Left Antecubital; Blood  Result Value Ref Range Status   Specimen Description   Final    LEFT ANTECUBITAL Performed at [facility] [address], [place], [place] [address]    Special Requests   Final    BOTTLES DRAWN AEROBIC AND ANAEROBIC Blood Culture results may not be optimal due to an excessive volume of blood received in culture bottles Performed at [facility] [address], [place], [place] [address]    Culture  Final    NO GROWTH 2 DAYS Performed at [facility], [place] [address] [place], [place] [address]    Report Status PENDING  Incomplete  MRSA PCR Screening     Status: None   Collection Time: 01/06/21  6:00 AM   Specimen: Nasal Mucosa; Nasopharyngeal  Result Value Ref Range Status   MRSA by PCR NEGATIVE NEGATIVE Final    Comment:        The GeneXpert MRSA Assay (FDA approved for NASAL specimens only),  is one component of a comprehensive MRSA colonization surveillance program. It is not intended to diagnose MRSA infection nor to guide or monitor treatment for MRSA infections. Performed at [facility], [place] [address], [place], [place] [address]          Radiology Studies: No results found.      Scheduled Meds: . apixaban  5 mg Oral BID  . atorvastatin  40 mg Oral QPC supper  . azithromycin  250 mg Oral Daily  . bisoprolol  5 mg Oral Daily  . diltiazem  120 mg Oral Daily  . furosemide  40 mg Oral BID  . melatonin  5 mg Oral QHS  . mometasone-formoterol  2 puff Inhalation BID  . predniSONE  40 mg Oral Q breakfast  . umeclidinium bromide  1 puff Inhalation Daily   Continuous Infusions: . sodium chloride Stopped (01/05/21 1724)  . ceFEPime (MAXIPIME) IV 2 g (01/08/21 1619)     Time spent: 22 minutes with over 50% of the time coordinating the patient's care    Harold Hedge, DO [facility]   Call night coverage person covering after 7pm

## 2021-01-08 NOTE — Progress Notes (Signed)
PT Cancellation Note/ Screen  Patient Details Name: Paul [name] MRN: [id number] DOB: 1946-08-31   Cancelled Treatment:    Reason Eval/Treat Not Completed: PT screened, no needs identified, will sign off Pt politely declines need for PT at this time. Pt states he has already washed up and ambulated today.  Pt reports no needs at home.   Pt does endorse vertigo (sounds like BPPV from his description) however he did not wish to perform any testing or treatment for this today and states he will f/u with his PCP if needed upon d/c.    Fedor Kazmierski,KATHrine E 01/08/2021, 1:41 PM Jannette Spanner PT, DPT Acute Rehabilitation Services Pager: [phone] Office: [phone]

## 2021-01-08 NOTE — Plan of Care (Signed)
  Problem: Education: Goal: Knowledge of General Education information will improve Description: Including pain rating scale, medication(s)/side effects and non-pharmacologic comfort measures Outcome: Progressing   Problem: Health Behavior/Discharge Planning: Goal: Ability to manage health-related needs will improve Outcome: Progressing   Problem: Clinical Measurements: Goal: Ability to maintain clinical measurements within normal limits will improve Outcome: Progressing Goal: Will remain free from infection Outcome: Progressing Goal: Diagnostic test results will improve Outcome: Progressing Goal: Respiratory complications will improve Outcome: Progressing Goal: Cardiovascular complication will be avoided Outcome: Progressing   Problem: Activity: Goal: Risk for activity intolerance will decrease Outcome: Progressing   Problem: Nutrition: Goal: Adequate nutrition will be maintained Outcome: Progressing   Problem: Coping: Goal: Level of anxiety will decrease Outcome: Progressing   Problem: Elimination: Goal: Will not experience complications related to bowel motility Outcome: Progressing Goal: Will not experience complications related to urinary retention Outcome: Progressing   Problem: Pain Managment: Goal: General experience of comfort will improve Outcome: Progressing   Problem: Safety: Goal: Ability to remain free from injury will improve Outcome: Progressing   Problem: Skin Integrity: Goal: Risk for impaired skin integrity will decrease Outcome: Progressing   Problem: Respiratory: Goal: Will maintain a patent airway Outcome: Progressing Goal: Complications related to the disease process, condition or treatment will be avoided or minimized Outcome: Progressing   Problem: Education: Goal: Knowledge of risk factors and measures for prevention of condition will improve Outcome: Progressing   Problem: Coping: Goal: Psychosocial and spiritual needs will be  supported Outcome: Progressing   Problem: Respiratory: Goal: Will maintain a patent airway Outcome: Progressing Goal: Complications related to the disease process, condition or treatment will be avoided or minimized Outcome: Progressing

## 2021-01-09 MED ORDER — PREDNISONE 10 MG PO TABS: ORAL_TABLET | ORAL | 0 refills | Status: DC

## 2021-01-09 NOTE — Discharge Summary (Signed)
Physician Discharge Summary  Richmond Coldren ZOX:[id number] DOB: 04-25-1946   PCP: [name], MD  Admit date: 01/05/2021 Discharge date: 01/09/2021 Length of Stay: 4 days   Code Status: Full Code  Admitted From:  Home Discharged to:   [place]:  None  Equipment/Devices: Continue home O2  Discharge Condition:  Stable  Recommendations for Outpatient Follow-up   1. Follow up with PCP in 1 week 2. Posterior scalp staples need removal 7-10 days after 1/14 3. Needs pulm follow-up  Hospital Summary   This is a 75 year old male with history of diabetes, hypertension, COPD on chronic steroids and as needed oxygen, atrial fibrillation on Eliquis, COVID-19 positive on 12/24/2020 who presented to [facility] ED on 1/14 s/p syncopal episode with subsequent posterior head laceration s/p staples as well as shortness of breath.  Recent hospitalization from 1/3-1/6 for acute hypoxic respiratory failure secondary to COVID-19 and completed remdesivir and was discharged with a steroid taper at the time and 2 L O2 nasal cannula.  In the ED CT head and cervical spine unremarkable, CXR with lingular pneumonia.  He was started on vancomycin and cefepime.  Antibiotics were changed to cefepime and azithromycin with negative MRSA nares for COPD exacerbation secondary to pneumonia.  He was started on prednisone 40 mg daily.  Patient had significant improvement in his symptoms and was discharged in stable condition on 01/09/2021 on 2 L/min nasal cannula   A & P   Principal Problem:   Lingular pneumonia Active Problems:   COPD GOLD  III    Acute on chronic respiratory failure with hypoxia (HCC)   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[place])   Essential hypertension   Syncope   COPD exacerbation ([place])   COVID-19     1. COPD exacerbation secondary to lingular pneumonia/HAP, nearly resolved a. Completed 5 days cefepime and azithromycin b. Continue home inhalers c. Patient is on chronic steroids and  sensitive to steroid adjustments.  Discharged with slow prednisone taper of 2-1/2 mg every 3 days starting at 40 mg d. Continue 2 L/min nasal cannula at discharge  2. BPPV, resolved  3. Syncope with resultant posterior scalp laceration, Likely secondary to pneumonia and dehydration/orthostasis a. On Eliquis, CT head negative b. Laceration bandaged and stapled in the ED on 1/14, will need staple removal in 7 to 10 days, advised to go to PCP or urgent care c. Negative orthostatics d. Tele uneventful e. PT eval - patient declined  4. Type 2 diabetes a. Outpatient follow up  5. Hypertension a. Continue home regimen  6. COVID-19 a. Diagnosed 12/24/2020, s/p 5 days remdesivir and steroid taper b. COVID-19 symptoms have likely resolved and symptoms most likely related to pneumonia as above  7. Paroxysmal A. Fib  chronic HFpEF, not in exacerbation a. Continue home bisoprolol, diltiazem, Eliquis, Lasix  8. Hyperlipidemia a. Continue statin     Consultants  . None  Procedures  . None  Antibiotics   Anti-infectives (From admission, onward)   Start     Dose/Rate Route Frequency Ordered Stop   01/07/21 1000  azithromycin (ZITHROMAX) tablet 250 mg  Status:  Discontinued       "Followed by" Linked Group Details   250 mg Oral Daily 01/06/21 0937 01/06/21 0944   01/07/21 0000  vancomycin (VANCOREADY) IVPB 1500 mg/300 mL  Status:  Discontinued        1,500 mg 150 mL/hr over 120 Minutes Intravenous Every 24 hours 01/06/21 0004 01/06/21 0937   01/06/21 1200  azithromycin [facility]) tablet 250 mg        250 mg Oral Daily 01/06/21 0943 01/09/21 0804   01/06/21 1030  azithromycin (ZITHROMAX) tablet 500 mg  Status:  Discontinued       "Followed by" Linked Group Details   500 mg Oral Daily 01/06/21 0937 01/06/21 0944   01/06/21 0800  ceFEPIme (MAXIPIME) 2 g in sodium chloride 0.9 % 100 mL IVPB        2 g 200 mL/hr over 30 Minutes Intravenous Every 8 hours 01/05/21 2308      01/06/21 0000  vancomycin (VANCOREADY) IVPB 1500 mg/300 mL        1,500 mg 150 mL/hr over 120 Minutes Intravenous STAT 01/05/21 2307 01/06/21 0305   01/06/21 0000  ceFEPIme (MAXIPIME) 2 g in sodium chloride 0.9 % 100 mL IVPB        2 g 200 mL/hr over 30 Minutes Intravenous STAT 01/05/21 2307 01/06/21 0035   01/05/21 1430  cefTRIAXone (ROCEPHIN) 1 g in sodium chloride 0.9 % 100 mL IVPB        1 g 200 mL/hr over 30 Minutes Intravenous  Once 01/05/21 1418 01/05/21 1552   01/05/21 1430  azithromycin (ZITHROMAX) 500 mg in sodium chloride 0.9 % 250 mL IVPB        500 mg 250 mL/hr over 60 Minutes Intravenous  Once 01/05/21 1418 01/05/21 1702       Subjective  Patient seen and examined at bedside no acute distress and resting comfortably.  No events overnight.  Tolerating diet. Dizziness has resolved.  Ambulating well.  Denies any chest pain, shortness of breath, fever, nausea, vomiting, urinary or bowel complaints. Otherwise ROS negative    Objective   Discharge Exam: Vitals:   01/08/21 2048 01/09/21 0510  BP: (!) 142/74 138/65  Pulse: 75 79  Resp: 20   Temp: 98.3 F (36.8 C) 98 F (36.7 C)  SpO2: 99% 100%   Vitals:   01/08/21 0626 01/08/21 1448 01/08/21 2048 01/09/21 0510  BP: 133/69 123/67 (!) 142/74 138/65  Pulse: 75 84 75 79  Resp: 20 20 20    Temp: 98.2 F (36.8 C) 98.8 F (37.1 C) 98.3 F (36.8 C) 98 F (36.7 C)  TempSrc: Oral Oral Oral Oral  SpO2: 98% 90% 99% 100%  Weight:      Height:        Physical Exam Vitals and nursing note reviewed.  Constitutional:      Appearance: Normal appearance.  HENT:     Head: Normocephalic.   Eyes:     Conjunctiva/sclera: Conjunctivae normal.  Cardiovascular:     Rate and Rhythm: Normal rate.     Pulses: Normal pulses.  Pulmonary:     Effort: Pulmonary effort is normal. No tachypnea.  Abdominal:     General: Abdomen is flat.     Palpations: Abdomen is soft.  Musculoskeletal:        General: No swelling or tenderness.      Right lower leg: No tenderness. No edema.     Left lower leg: No tenderness. No edema.  Skin:    Coloration: Skin is not jaundiced or pale.  Neurological:     Mental Status: He is alert. Mental status is at baseline.  Psychiatric:        Mood and Affect: Mood normal.        Behavior: Behavior normal.       The results of significant diagnostics from this hospitalization (including imaging, microbiology, ancillary  and laboratory) are listed below for reference.     Microbiology: Recent Results (from the past 240 hour(s))  Culture, blood (routine x 2) Call MD if unable to obtain prior to antibiotics being given     Status: None (Preliminary result)   Collection Time: 01/05/21 10:35 PM   Specimen: Left Antecubital; Blood  Result Value Ref Range Status   Specimen Description   Final    LEFT ANTECUBITAL Performed at [facility], [address], [place], [place] [address]    Special Requests   Final    Blood Culture results may not be optimal due to an excessive volume of blood received in culture bottles BOTTLES DRAWN AEROBIC AND ANAEROBIC   Culture   Final    NO GROWTH 3 DAYS Performed at [facility], [address] [place], [place] [address]    Report Status PENDING  Incomplete  Culture, blood (routine x 2) Call MD if unable to obtain prior to antibiotics being given     Status: None (Preliminary result)   Collection Time: 01/05/21 10:35 PM   Specimen: Left Antecubital; Blood  Result Value Ref Range Status   Specimen Description   Final    LEFT ANTECUBITAL Performed at [facility], [address], [place], [place] [address]    Special Requests   Final    BOTTLES DRAWN AEROBIC AND ANAEROBIC Blood Culture results may not be optimal due to an excessive volume of blood received in culture bottles Performed at [facility], [address], [place], [place] [address]    Culture   Final    NO GROWTH 3  DAYS Performed at [facility], [address] [place], [place] [address]    Report Status PENDING  Incomplete  MRSA PCR Screening     Status: None   Collection Time: 01/06/21  6:00 AM   Specimen: Nasal Mucosa; Nasopharyngeal  Result Value Ref Range Status   MRSA by PCR NEGATIVE NEGATIVE Final    Comment:        The GeneXpert MRSA Assay (FDA approved for NASAL specimens only), is one component of a comprehensive MRSA colonization surveillance program. It is not intended to diagnose MRSA infection nor to guide or monitor treatment for MRSA infections. Performed at [facility], [address], [place], [place] [address]      Labs: BNP (last 3 results) Recent Labs    02/02/20 1200 02/16/20 1726 03/04/20 1220  BNP 87.4 88.9 82.1   Basic Metabolic Panel: Recent Labs  Lab 01/05/21 1255 01/06/21 0259  NA 137 141  K 4.1 4.0  CL 91* 98  CO2 32 33*  GLUCOSE 141* 92  BUN 16 17  CREATININE 0.85 0.77  CALCIUM 8.9 8.4*   Liver Function Tests: Recent Labs  Lab 01/05/21 1255 01/06/21 0259  AST 27 19  ALT 39 33  ALKPHOS 62 53  BILITOT 0.6 0.6  PROT 7.5 5.9*  ALBUMIN 3.7 2.9*   No results for input(s): LIPASE, AMYLASE in the last 168 hours. No results for input(s): AMMONIA in the last 168 hours. CBC: Recent Labs  Lab 01/05/21 1255 01/06/21 0708  WBC 23.5* 14.0*  HGB 13.7 11.3*  HCT 45.0 38.1*  MCV 80.6 82.6  PLT 354 288   Cardiac Enzymes: No results for input(s): CKTOTAL, CKMB, CKMBINDEX, TROPONINI in the last 168 hours. BNP: Invalid input(s): POCBNP CBG: Recent Labs  Lab 01/05/21 2259  GLUCAP 100*  D-Dimer No results for input(s): DDIMER in the last 72 hours. Hgb A1c No results for input(s): HGBA1C in the last 72 hours. Lipid Profile No results for input(s): CHOL, HDL, LDLCALC, TRIG, CHOLHDL, LDLDIRECT in the last 72 hours. Thyroid function studies No results for input(s): TSH, T4TOTAL, T3FREE, THYROIDAB in the last 72  hours.  Invalid input(s): FREET3 Anemia work up No results for input(s): VITAMINB12, FOLATE, FERRITIN, TIBC, IRON, RETICCTPCT in the last 72 hours. Urinalysis    Component Value Date/Time   COLORURINE YELLOW 01/05/2021 1315   APPEARANCEUR CLEAR 01/05/2021 1315   LABSPEC 1.020 01/05/2021 1315   PHURINE 7.5 01/05/2021 1315   GLUCOSEU NEGATIVE 01/05/2021 1315   HGBUR LARGE (A) 01/05/2021 1315   BILIRUBINUR NEGATIVE 01/05/2021 1315   KETONESUR NEGATIVE 01/05/2021 1315   PROTEINUR NEGATIVE 01/05/2021 1315   NITRITE NEGATIVE 01/05/2021 1315   LEUKOCYTESUR NEGATIVE 01/05/2021 1315   Sepsis Labs Invalid input(s): PROCALCITONIN,  WBC,  LACTICIDVEN Microbiology Recent Results (from the past 240 hour(s))  Culture, blood (routine x 2) Call MD if unable to obtain prior to antibiotics being given     Status: None (Preliminary result)   Collection Time: 01/05/21 10:35 PM   Specimen: Left Antecubital; Blood  Result Value Ref Range Status   Specimen Description   Final    LEFT ANTECUBITAL Performed at [facility], [place] [address], [place], [place] [address]    Special Requests   Final    Blood Culture results may not be optimal due to an excessive volume of blood received in culture bottles BOTTLES DRAWN AEROBIC AND ANAEROBIC   Culture   Final    NO GROWTH 3 DAYS Performed at [facility], [place] [address] [place], [place] [address]    Report Status PENDING  Incomplete  Culture, blood (routine x 2) Call MD if unable to obtain prior to antibiotics being given     Status: None (Preliminary result)   Collection Time: 01/05/21 10:35 PM   Specimen: Left Antecubital; Blood  Result Value Ref Range Status   Specimen Description   Final    LEFT ANTECUBITAL Performed at [place] [address] [place], [place] [address]    Special Requests   Final    BOTTLES DRAWN AEROBIC AND ANAEROBIC Blood Culture results may not be optimal due to an excessive  volume of blood received in culture bottles Performed at [place] [address] [place], [place] [address]    Culture   Final    NO GROWTH 3 DAYS Performed at [facility], [place] [address], [place], [place] [address]    Report Status PENDING  Incomplete  MRSA PCR Screening     Status: None   Collection Time: 01/06/21  6:00 AM   Specimen: Nasal Mucosa; Nasopharyngeal  Result Value Ref Range Status   MRSA by PCR NEGATIVE NEGATIVE Final    Comment:        The GeneXpert MRSA Assay (FDA approved for NASAL specimens only), is one component of a comprehensive MRSA colonization surveillance program. It is not intended to diagnose MRSA infection nor to guide or monitor treatment for MRSA infections. Performed at [facility], [place] [address] [place], [place] [address]     Discharge Instructions     Discharge Instructions    Diet - low sodium heart healthy   Complete by: As directed    Discharge instructions   Complete by: As directed  You were hospitalized for a COPD exacerbation from pneumonia as well as concern for an episode of loss of consciousness. You likely lost consciousness due to the underlying pneumonia and suspected dehydration.  Upon discharge:  - Start the prednisone taper from 40 mg as prescribed. Take 40 mg once daily for the next two days starting 1/19 then decrease your dose by 2.5 mg every three days - Follow up with your pulmonologist in the next week - Be careful when getting up and rise slowly - you can get your staples removed by either your primary care physician or at an urgent care in 7-10 days from placement. They were placed on 01/05/21  If you have any significant change or worsening of your symptoms do not hesitate to return to the ED   Increase activity slowly   Complete by: As directed    No wound care   Complete by: As directed    Get staples removed by PCP or at an Urgent Care in 7-10 days  from placement. They were placed on 01/05/21     Allergies as of 01/09/2021      Reactions   Daliresp [roflumilast]    Nausea, vomiting and diarrhea.   Alendronate Other (See Comments)   Jittery/nervous      Medication List    TAKE these medications   acetaminophen 325 MG tablet Commonly known as: TYLENOL Take 2 tablets (650 mg total) by mouth every 6 (six) hours as needed for mild pain or headache (fever >/= 101).   apixaban 5 MG Tabs tablet Commonly known as: ELIQUIS Take 1 tablet (5 mg total) by mouth 2 (two) times daily.   ascorbic acid 500 MG tablet Commonly known as: VITAMIN C Take 1 tablet (500 mg total) by mouth daily.   atorvastatin 40 MG tablet Commonly known as: LIPITOR TAKE 1 TABLET(40 MG) BY MOUTH DAILY AFTER SUPPER What changed:   how much to take  how to take this  when to take this  additional instructions   bisoprolol 5 MG tablet Commonly known as: ZEBETA Take 1 tablet (5 mg total) by mouth daily.   budesonide-formoterol 160-4.5 MCG/ACT inhaler Commonly known as: SYMBICORT Inhale 2 puffs into the lungs 2 (two) times daily.   calcium carbonate 600 MG Tabs tablet Commonly known as: OS-CAL Take 600 mg by mouth daily.   denosumab 60 MG/ML Sosy injection Commonly known as: PROLIA Inject 60 mg into the skin every 6 (six) months.   diltiazem 120 MG 24 hr capsule Commonly known as: CARDIZEM CD Take 1 capsule (120 mg total) by mouth daily.   furosemide 40 MG tablet Commonly known as: LASIX Take 1 tablet (40 mg total) by mouth 2 (two) times daily.   guaiFENesin 600 MG 12 hr tablet Commonly known as: MUCINEX Take 600 mg by mouth 2 (two) times daily.   loratadine 10 MG tablet Commonly known as: CLARITIN Take 10 mg by mouth daily.   melatonin 5 MG Tabs Take 10 mg by mouth at bedtime as needed (sleep).   multivitamin with minerals Tabs tablet Take 1 tablet by mouth daily.   omeprazole 20 MG capsule Commonly known as: PRILOSEC Take 30- 60  min before your first and last meals of the day What changed:   how much to take  how to take this  when to take this  reasons to take this  additional instructions   OXYGEN Inhale 2 L into the lungs daily. Use as directed at night time and  when up and walking around.   predniSONE 10 MG tablet Commonly known as: DELTASONE Take 4 tablets (40 mg total) by mouth daily with breakfast for 2 days, THEN 4 tablets (40 mg total) daily with breakfast for 3 days, THEN 3.5 tablets (35 mg total) daily with breakfast for 3 days, THEN 3.5 tablets (35 mg total) daily with breakfast for 3 days, THEN 3 tablets (30 mg total) daily with breakfast for 3 days, THEN 3 tablets (30 mg total) daily with breakfast for 3 days, THEN 2.5 tablets (25 mg total) daily with breakfast for 3 days, THEN 2.5 tablets (25 mg total) daily with breakfast for 3 days, THEN 2 tablets (20 mg total) daily with breakfast for 3 days. Start taking on: January 09, 2021 What changed:   See the new instructions.  Another medication with the same name was removed. Continue taking this medication, and follow the directions you see here.   ProAir HFA 108 (90 Base) MCG/ACT inhaler Generic drug: albuterol Inhale 2 puffs into the lungs every 6 (six) hours as needed for wheezing or shortness of breath. What changed: Another medication with the same name was changed. Make sure you understand how and when to take each.   albuterol (2.5 MG/3ML) 0.083% nebulizer solution Commonly known as: PROVENTIL Take 3 mLs (2.5 mg total) by nebulization in the morning and at bedtime. What changed:   when to take this  reasons to take this   Spiriva Respimat 2.5 MCG/ACT Aers Generic drug: Tiotropium Bromide Monohydrate Inhale 2 puffs into the lungs daily.   VITAMIN B-3 PO Take 1 tablet by mouth daily.   Vitamin D 50 MCG (2000 UT) Caps Take 2,000 Units by mouth daily.       Allergies  Allergen Reactions  . Daliresp [Roflumilast]      Nausea, vomiting and diarrhea.  . Alendronate Other (See Comments)    Jittery/nervous   Dispo:  Patient From: Home  Planned Disposition: Home  Expected discharge date: 01/09/2021  Medically stable for discharge: No        Time coordinating discharge: Over 30 minutes   SIGNED:   Harold Hedge, D.O. [facility] Pager: [phone]  01/09/2021, 2:34 PM

## 2021-01-09 NOTE — Care Management Important Message (Signed)
Important Message  Patient Details IM Letter placed in Patient's door [place]. Name: Paul [name] MRN: [id number] Date of Birth: 1946/08/25   Medicare Important Message Given:  Yes     Kerin Salen 01/09/2021, 3:11 PM

## 2021-01-09 NOTE — Plan of Care (Signed)
  Problem: Education: Goal: Knowledge of General Education information will improve Description: Including pain rating scale, medication(s)/side effects and non-pharmacologic comfort measures 01/09/2021 1500 by Lesle Chris, RN Outcome: Adequate for Discharge 01/09/2021 1459 by Lesle Chris, RN Outcome: Adequate for Discharge   Problem: Clinical Measurements: Goal: Will remain free from infection 01/09/2021 1500 by Lesle Chris, RN Outcome: Adequate for Discharge 01/09/2021 1459 by Lesle Chris, RN Outcome: Adequate for Discharge

## 2021-01-11 LAB — CULTURE, BLOOD (ROUTINE X 2)
Culture: NO GROWTH
Culture: NO GROWTH

## 2021-01-12 ENCOUNTER — Ambulatory Visit (INDEPENDENT_AMBULATORY_CARE_PROVIDER_SITE_OTHER): Payer: Medicare Other | Admitting: Internal Medicine

## 2021-01-12 ENCOUNTER — Encounter: Payer: Self-pay | Admitting: Internal Medicine

## 2021-01-12 ENCOUNTER — Other Ambulatory Visit: Payer: Self-pay

## 2021-01-12 ENCOUNTER — Telehealth: Payer: Self-pay | Admitting: Internal Medicine

## 2021-01-12 DIAGNOSIS — J449 Chronic obstructive pulmonary disease, unspecified: Secondary | ICD-10-CM

## 2021-01-12 DIAGNOSIS — J189 Pneumonia, unspecified organism: Secondary | ICD-10-CM

## 2021-01-12 MED ORDER — LEVOFLOXACIN 500 MG PO TABS: 500.0000 mg | ORAL_TABLET | Freq: Every day | ORAL | 0 refills | Status: DC

## 2021-01-12 NOTE — Telephone Encounter (Signed)
I have called and left a message for Paul [name] to make her aware that we were going to call the pt and get him scheduled for a televisit today with MW.  I called the pt and he is on the schedule for 3:15 today with MW.

## 2021-01-12 NOTE — Progress Notes (Signed)
[name], male    DOB: 1946-02-14,    MRN: [id number]   Brief patient profile:  44 yowm MM/Quit smoking  02/03/09  with GOLD III spirometry baseline doe x able to golf. Only using 02 at  [facility] with  maint on symbicort/ spiriva and no chronic pred/ albuterol twice daily at most  much worse mid March 2020.   PFT 05/29/16: FVC 2.08 L (86%) FEV1 0.83 L (31%) FEV1/FVC 0.40 FEF 25-75 0.30 L (14%) no bronchodilator response TLC 6.56 L (107%) RV 167% ERV 160% DLCO corrected 60% (hemoglobin 10.9) 07/15/13: FVC 2.68 L (60%) FEV1 1.63 L (53%) FEV1/FVC 0.61 FEF 25-75 0.76 L (26%) 02/04/12: FVC 2.18 L (57%) FEV1 1.09 L (36%) FEV1/FVC 0.50 FEF 25-75 0.33 L (11%)   Admit date: 03/19/2019 Discharge date: 03/25/2019   Recommendations for Outpatient Follow-up:  1. F/u with PCP within a week  for hospital discharge follow up, repeat cbc/bmp at follow up 2. F/u with cardiology for new diagnosis of afib 3. Home 02  Discharge Diagnoses:      Active Hospital Problems   Diagnosis Date Noted  . PAF (paroxysmal atrial fibrillation) ([place])   . Acute on chronic respiratory failure with hypoxia ([place])   . COPD exacerbation ([place]) 03/19/2019         Filed Weights   03/23/19 1700 03/24/19 0503 03/25/19 0528  Weight: 60.2 kg 60 kg 62.3 kg    History of present illness: (per admitting MD Dr Earnest Conroy) PCP:Cox, Elnita Maxwell, MD Patient coming from:Home  Chief Complaint:Worsening shortness of breath  [name]:DHRC [name] a 75 y.o.malewith history h/oCOPD, hyperlipidemia whoat baseline was using 2 L O2 via nasal cannula only at night and follows [facility] (Dr. Vaughan Browner) as outpatient presents with worsening shortness of breath. Patient reports that at baseline he has cough with white phlegm and is able to walk around doing his ADLs without shortness of breath and uses O2 only at night. 2 weeks back he developed productive cough with green phlegm and shortness of breath but no fevers. He  underwent CT chest with contrast on March 18 which was negative for PE or infiltrates. He apparently was given doxycycline/prednisone course at that time which helped him transiently but symptoms worsened again past Monday with shortness of breath on minimum exertion/cough and he noticed his pulse ox was dropping to 74%.He started using 2 L O2 nasal cannula continuous. On Tuesday, he called pulmonary office given persistent symptoms and also pulse oximetry showing O2 sats84% on exertion and 93% at rest in spite of using continuous O2. He was advised by Dr. Vaughan Browner to use 60 mg prednisone x3 days followed by 10 mg taper every 3 days. He was also advised to use nebulizer treatments every 3 hours and continue Symbicort inhaler twice daily. Patient was supposed to start 50 mg prednisone today but woke up feeling extremely short of breath. He states he is also been having bad bouts of cough at night with wheezing and bronchospasm.  He presented to [facility] ED where he was noted to be afebrile but hypoxic, required BiPAP for 3 hours, chest x-ray was negative for infiltrates. Patient denies any recent history of travel or known sick contacts with flu positive or coronavirus positive patients. He lives at home with his wife who is recovering from lung cancer and completed chemotherapy 1 month back.  Hospitalist service was called to request direct admission to medical floor but ED to ED transfer facilitated for thorough clinical evaluation prior  to requesting medical bed given coronavirus pandemic. In the ED at [facility], patient has been saturating well on 3 L nasal cannula but appears tachypneic on talking full sentences. He denies any chest pain, denies any fevers or leg swellings.  Hospital Course:  Active Problems:   COPD exacerbation (HCC)   PAF (paroxysmal atrial fibrillation) (HCC)   Acute on chronic respiratory failure with hypoxia (HCC)   Acute onHypoxic respiratory failure,  COPDexacerbation,Progression of severe COPD -Emphysema, with no evidence of superimposed acute cardiopulmonary disease -improving on iv steroids, oral zitrhomax, he finished total of 5 days of zithromax in the hospital, he is discharged on slow prednisone taper.  - o2 dropped to 86% while ambulating on room air, discharged on continuous home o2 ( previously on nighttime o2 only) -he is to follow with Dr Vaughan Browner    Paroxsymalafib, New diagnosis of afib -he is started on eliquis (CHADS-VASC 2, age and aortic atherosclerosis), cardizem, lasix -cardiology consulted recommend "Diltiazem CD 360. Avoid amiodarone and beta blockers given his underlying lung dz." -outpatient echo per cardiology   Aortic and multivessel coronary atherosclerosis - Changed to high intensity statin, atorvastatin 40mg  PO QD. Check lipids and ALT as outpatient in 3 months.  - ASA 81. Aggressive secondary prevention. - No chest pain.      History of Present Illness  03/29/2019  Pulmonary/ extended post hosp f/u eval/Letishia Elliott  Re GOLD III copd ? 02 dep now Chief Complaint  Patient presents with  . Hospitalization Follow-up    Breathing has improved some. He has not used his albuterol inhaler or neb since hospital d/c.  Dyspnea:  mb and back ok off 02 sats upper 80s at end  Cough: none Sleep: recliner x 45 degrees = baseline 02 2lpm hs  SABA use: none  Since d/c on symb 160 2bid and spiriva each pm (technique 50% on both, see a/p) On pred 50 mg daily on day as ov rec Plan A = Automatic = symbicort 160 Take 2 puffs first thing in am and then another 2 puffs about 12 hours later.  Spiriva x 2 puffs  right after symbicort  Work on inhaler technique: Plan B = Backup Only use your albuterol inhaler(Proventil/Proair)  as a rescue medication Plan C = Crisis - only use your albuterol nebulizer if you first try Plan B and it fails to help > ok to use the nebulizer up to every 4 hours but if start needing it  regularly call for immediate appointment Plan D = Doctor - call me if B and C not adequate Plan E = ER - go to ER or call 911 if all else fails   02  2lpm at bedtime and adjust to keep it over 90% with activity       10/26/2019  f/u ov/Eugean Arnott re: GOLD III/  Steroid dep since early March 2020 on symb/spiriva and pred 20 mg daily  Chief Complaint  Patient presents with  . Follow-up    Breathing is doing well. He uses his proair once per wk on average. He has not used neb recently.   Dyspnea: walking 1.5 - 3 miles a day  Cough: none  Sleeping: hob up 6 inches mattress wedge  SABA use: as above, rarely needed 02: 2lpm hs/ sats running in high 80's with activity  rec Plan A = Automatic = Always=    Trelegy one click each am (spiriva/ symbicort)  Plan B = Backup (to supplement plan A, not to replace it) Only  use your albuterol inhaler as a rescue medication Plan C = Crisis (instead of Plan B but only if Plan B stops working) - only use your albuterol nebulizer if you first try Plan B  Try prednisone 15 mg daily if possible - increase to 20 mg daily if not doing as well    Televisit  12/27/19 recs Plan A = Automatic = Always=    Trelegy each am  Plan B = Backup (to supplement plan A, not to replace it) Only use your albuterol inhaler as a rescue medication Plan C = Crisis (instead of Plan B but only if Plan B stops working) - only use your albuterol nebulizer if you first try Plan B Plan D = Deltasone  - 2 prednisone = 20 mg / day until better then drop to 15 mg daily as tolerated  Make sure you check your oxygen saturations at highest level of activity to be sure it stays over 90% and adjust upward to maintain this level if needed but remember to turn it back to previous settings when you stop (to conserve your supply).  Late add:  Prilosec 20 mg Take 30- 60 min before your first and last meals of the day until returns and continue off the fosfamax (not taking it anyway)   GERD diet zpak     Admit date: 01/04/2020 Discharge date: 01/11/2020  Admitted From: home Disposition: home  Recommendations for Outpatient Follow-up:  1. Follow up with PCP in 1-2 weeks 2. Keep appointment with Dr. Joya Gaskins as scheduled 3. Please obtain BMP/CBC in one week 4. Please follow up on the following pending results: consider starting prolia  Home Health:no Equipment/Devices:home oxygen, not new     Brief/Interim Summary: Per H &P:Nedim [name] a 75 y.o.malewith medical history significant ofA. fib, COPD, chronic respiratory failure on 2 L home oxygen only at nighttime, hyperlipidemia presented with acute exacerbation of chronic obstructive pulmonary disease. Due to the severity of his underlying lung disease with chronic prednisone usage slowly weaned down to TID nebulizers scheduled and to oral prednisone. Had some Pt/OT which caused desaturations throughout hospital stay. Due to increased coughing in the hospital he had new upper back pain. With findings of midline tenderness X-ray ordered with old compression fractures and new compression fracture in t spine. Started calcitonin nasal spray and urged him to consider prolia. He states he tried fosamax and could not tolerate. Overall he continued to improve throughout the hospital course and was discharged home on extended prednisone taper back to home 20 mg daily. Asked to schedule nebulizer treatments TID at home for about 1 week then down to BID if able to tolerate.    Discharge Diagnoses:  Principal Problem:   COPD with acute exacerbation [facility]):   Chronic respiratory failure with hypoxia (HCC)   Paroxysmal atrial fibrillation (HCC)   HLD (hyperlipidemia)   SOB (shortness of breath)   01/27/2020  f/u ov/Tonna Palazzi re:  Copd with persistent flare since March 2020 remains high dose steroid dep  Chief Complaint  Patient presents with  . Follow-up    Breathing is about the same. He rarely uses his albuterol inhaler. He used neb last 1 wk  ago. He has occ cough with green sputum. Currently on 40 mg pred.   Dyspnea:  Across the room  Cough: some before bed / uses flutter light green only using one 600 mg mucinex qam only  but does use flutter Sleeping: wedge blocks SABA use: minimal  02: 2lpm hs /  says usually above 90% walking RA rec Stop lopressor and start Bisoprolol 5 mg daily in its place - if blood pressure is too high take twice daily  For cough / congestion > mucinex up to 1200 mg twice daily and use flutter Plan A = Automatic = Always=   Use albuterol neb 1st thing in am and wait 15 minutes and then use Trelegy  Plan B = Backup (to supplement plan A, not to replace it) Only use your albuterol inhaler  Plan C = Crisis (instead of Plan B but only if Plan B stops working) - only use your albuterol nebulizer if you first try Plan B and it fails to help > ok to use the nebulizer up to every 4 hours but if start needing it regularly call for immediate appointment Prednisone 20 mg daily  - ok to double if doing worse Please schedule a follow up office visit in 4 weeks, sooner if needed  with all medications   Admission date:  02/16/2020   Discharge Date:  02/20/2020   COPD with acute exacerbation (HCC)  Paroxysmal atrial fibrillation ([place])  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[place])  Essential hypertension  Acute respiratory failure with hypoxia ([place])     02/24/2020   Extended post hosp ov/Pietro Bonura re: copd  On prednisone 30 mg daily to taper to 20 p 3 more days Dyspnea:  Just walking in house for now but much better Cough: better  Sleeping: wedge under mattress maybe 30 degrees hob ok SABA use: none now  02: sleep on 2lpm/ sats in 90's s supplement rec Make sure you check your oxygen saturations at highest level of activity to be sure it stays over 90% and adjust upward to maintain this level if needed but remember to turn it back to previous settings when you stop (to conserve your supply).  Try  albuterol(Proair) 15 min before an activity that you know would make you short of breath and see if it makes any difference and if makes none then don't take it after activity unless you can't catch your breath. Reduce prednsione to 20 mg daily = 2 x 10 as planned  - if you are doing great ok to try 1.5 = 15 mg daily If continue to flare then ok to add daliresp 250 mg one daily  For cough / congestion as needed >>  mucinex up 1200mg  every 12 hours but use the flutter valve as much as you can  Please schedule a follow up office visit in 6 weeks, call sooner if needed with all medications /inhalers/ solutions in hand so we can verify exactly what you are taking.     10/03/2020  f/u ov/Ory Elting re: could not tolerate daliresp/ on pred 10 mg daily  Chief Complaint  Patient presents with  . Follow-up    Denies any problems with breathing  Dyspnea:  Treadmill  6/7 days,  30 min s stopping,  2.8 mph 3 degrees at the end does not check sats at peak ex Cough: none  Sleeping: wedge 30 degrees on side  SABA [id number] on neb twice daily  02: 2lpm hs /  Not using at all during day  rec   12/23/2020 onset of covid symptoms > admit     NP eval post covid d/c 01/03/21  recs Resolving COPD exacerbation with recent COVID-19 infection.  Patient is clinically improving getting close back to baseline. We will slowly wean prednisone as patient is very sensitive to dosage  changes in the past. Plan  Patient Instructions  Decrease Prednisone dose by 2.5mg  every 3 days until at  Prednisone 10mg  daily -hold at this dose.  Continue on Symbicort 2 puffs Twice daily   Continue on Spiriva daily  Mucinex Twice daily  As needed  Cough/congestion  Flutter valve Three times a day  .  May restart Albuterol Neb Twice daily  . -use the precautions we discussed  Wear Oxygen 2l/m to maintain O2 sats >88-90%.  Activity as tolerated.   ? Lingular pna on cxr 01/06/20 rx with 5 d zmax/ maxepime  Admit date: 01/05/2021 Discharge  date: 01/09/2021 Length of Stay: 4 days   Code Status: Full Code  Admitted From:  Home Discharged to:   [place]:  None  Equipment/Devices: Continue home O2  Discharge Condition:  Stable  Recommendations for Outpatient Follow-up   1. Follow up with PCP in 1 week 2. Posterior scalp staples need removal 7-10 days after 1/14 3. Needs pulm follow-up  Hospital Summary   This is a 75 year old male with history of diabetes, hypertension, COPD on chronic steroids and as needed oxygen, atrial fibrillation on Eliquis, COVID-19 positive on 12/24/2020 who presented to [facility] ED on 1/14 s/p syncopal episode with subsequent posterior head laceration s/p staples as well as shortness of breath. Recent hospitalization from 1/3-1/6 for acute hypoxic respiratory failure secondary to COVID-19 and completed remdesivir and was discharged with a steroid taper at the time and 2 L O2 nasal cannula. In the ED CT head and cervical spine unremarkable, CXR with lingular pneumonia. He was started on vancomycin and cefepime.  Antibiotics were changed to cefepime and azithromycin with negative MRSA nares for COPD exacerbation secondary to pneumonia.  He was started on prednisone 40 mg daily.  Patient had significant improvement in his symptoms and was discharged in stable condition on 01/09/2021 on 2 L/min nasal cannula   A & P   Principal Problem:   Lingular pneumonia Active Problems:   COPD GOLD  III    Acute on chronic respiratory failure with hypoxia (HCC)   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[place])   Essential hypertension   Syncope   COPD exacerbation ([place])   COVID-19     1. COPD exacerbation secondary to lingular pneumonia/HAP, nearly resolved a. Completed 5 days cefepime and azithromycin b. Continue home inhalers c. Patient is on chronic steroids and sensitive to steroid adjustments.  Discharged with slow prednisone taper of 2-1/2 mg every 3 days starting at 40  mg d. Continue 2 L/min nasal cannula at discharge  2. BPPV, resolved  3. Syncope with resultant posterior scalp laceration, Likely secondary to pneumonia and dehydration/orthostasis a. On Eliquis, CT head negative b. Laceration bandaged and stapled in the ED on 1/14, will need staple removal in 7 to 10 days, advised to go to PCP or urgent care c. Negative orthostatics d. Tele uneventful e. PT eval- patient declined  4. Type 2 diabetes a. Outpatient follow up  5. Hypertension a. Continue home regimen  6. COVID-19 a. Diagnosed 12/24/2020, s/p 5 days remdesivir and steroid taper b. COVID-19 symptoms have likely resolved and symptoms most likely related to pneumonia as above  7. Paroxysmal A. Fib  chronic HFpEF, not in exacerbation a. Continue home bisoprolol, diltiazem, Eliquis, Lasix  8. Hyperlipidemia a. Continue statin     Virtual Visit via Telephone Note 01/12/2021   I connected with [name] on 01/12/21 at  1PM EST by telephone and verified that  I am speaking with the correct person using two identifiers. Pt is at home and this call made from my office with no other participants    I discussed the limitations, risks, security and privacy concerns of performing an evaluation and management service by telephone and the availability of in person appointments. I also discussed with the patient that there may be a patient responsible charge related to this service. The patient expressed understanding and agreed to proceed.   History of Present Illness: On mucinex 600 mg twice / am 01/12/2021 mucus darker since d/c from hosp and off abx   Dyspnea:  Room to room on 2.5 lpm low 90s sats Cough: darker, more congested feeling  Sleeping: sleeping recliner x 45 degrees (new)  But does fine overnight  SABA use: now using more including neb  02: 2lpm hs,  Not needing at rest, up 2.5 lpm walking    No obvious day to day or daytime variability or assoc  mucus plugs or  hemoptysis or cp  or subjective wheeze or overt sinus or hb symptoms.    Also denies any obvious fluctuation of symptoms with weather or environmental changes or other aggravating or alleviating factors except as outlined above.   Meds reviewed/ med reconciliation completed     No outpatient medications have been marked as taking for the 01/12/21 encounter (Appointment) with Tanda Rockers, MD.         Observations/Objective: Sounds fine on phone, no conversational sob with minimal rattling    I personally reviewed images and agree with radiology impression as follows:  CXR:   01/05/21 Hazy airspace opacity in lingula worrisome for pneumonia.   Assessment and Plan: See problem list for active a/p's   Follow Up Instructions: See avs for instructions unique to this ov which includes revised/ updated med list     I discussed the assessment and treatment plan with the patient. The patient was provided an opportunity to ask questions and all were answered. The patient agreed with the plan and demonstrated an understanding of the instructions.   The patient was advised to call back or seek an in-person evaluation if the symptoms worsen or if the condition fails to improve as anticipated.  I provided 25 minutes of non-face-to-face time during this encounter.   Christinia Gully, MD

## 2021-01-12 NOTE — Patient Instructions (Signed)
For cough > mucinex 1200 mg every 12 hours and flutter valve as much as you can  Continue prednisone taper gradually as you plan but not before   Levaquin 500 mg daily x 7 days  Leave off the inhalers except for the spiriva and just use the nebulized albuterol 2.5 evey 4 hours then Monday 1/24 start symbicort Take 2 puffs first thing in am and then another 2 puffs about 12 hours later.   Increase prilosec 20 mg x 2 Take 30- 60 min before your first and last meals of the day   Keep your appt with Tammy NP on 1/25  Add: cxr on return

## 2021-01-12 NOTE — Telephone Encounter (Signed)
Called and spoke with Paul [name] from [place]. Pt was diagnosed with covid 1/2 and was hospitalized 1/14-1/18 due to pneumonia.  Stated to Paul [name] that pt has a hospital follow up appt scheduled 01/16/21 and she states that she does not think pt could wait until then for any treament/recommendations.  Paul [name] stated that pt has complaints of cough and will occ get up brown phlegm. Pt also has chest tightness and also tightness in his back and states that he cannot take a deep breath.  Paul [name] stated that when pt was discharged home from hospital, pt was discharged home on a steroid taper. Pt is now currently doing 37.5mg  prednisone daily and is tapering down every 3 days.  Pt is also using his spiriva and symbicort inhalers as prescribed. Paul [name] stated that yesterday 1/20, pt did 2 neb treatments but stated that pt will be using the nebulizer every 4 hours today 1/21.  Paul [name] also stated that pt has been doing mucinex twice daily and also has been taking mucinex cough syrup at night. Pt also has been using benzonatate at night as well.  Asked Paul [name] if pt had been wearing O2 and she said that pt has not been wearing it at rest and stated today when she checked pt's sats, he was 92% on room air at rest. Paul [name] stated that pt is wearing the O2 with ambulation at 2L but stated that pt's wife said pt had to turn it up to 3L earlier today 1/21 when he got up to walk around due to being more SOB.  Paul [name] and pt are requesting recommendations to help with pt's symptoms.  Dr. Melvyn Novas, please advise.

## 2021-01-12 NOTE — Assessment & Plan Note (Addendum)
Quit smoking 01/2009  02/04/12 [name]: FEV1 36% FVC 57% FEF 25 75    11% -05/29/16  FEV1 0.83 L (31%) FEV1/FVC 0.40 FEF 25-75 0.30 L (14%) no bronchodilator response p ? rx prior, DLCO corrected 60% (hemoglobin 10.9) - 03/29/2019   continue symb 160 2bid and spiriva smi 2 puffs each am  - 04/15/2019 placed on daily pred with ceiling 40 and floor of 10 mg daily and empirical rx for GERD - 04/15/2019 flutter valve training  - alpha one screen 06/15/2019  MM  Level 178 - 06/15/2019  After extensive coaching inhaler device,  effectiveness =    90% > so try to taper pred off using ceiling of 20 if worse > 07/26/2019 changed to ceiling of 10 and floor of 5 mg  - 10/26/2019  After extensive coaching inhaler device,  effectiveness =    90% with elipta and still needing pred 20 mg daily so try trelegy sample > consider breztri if prefers hfa and insurance covers  - 12/27/2019 added ppi bid ac otc for atypical spells of sob  - 01/27/2020 changed lopressor to bisoprolol due to refractory wheeze/ sob on lopressor 100 mg daily - Sinus CT  02/03/20 1. Minimal paranasal sinus mucosal thickening, limited to a left ethmoid air cell. No convincing acute sinusitis. 2. Symmetric but mild nasal cavity mucosal thickening, raising the possibility of mild rhinitis. CT chest 02/03/20 1. Coronary artery calcifications are noted suggesting coronary artery disease. 2. Emphysematous disease is noted in the upper lobes bilaterally. 3. Interval development of several old thoracic compression fractures since prior exam. No definite acute fracture is noted.  - 02/24/20 trial of daliresp 250 > could not tol  10/03/2020  After extensive coaching inhaler device,  effectiveness =    75% (short ti, short breath hold, too much of a hurry    Group D in terms of symptom/risk and laba/lama/ICS  therefore appropriate rx at this point >>> symbicort 160/ spiriva maint approp but while coughing so hard ok to change to neb saba q 4 h and continue spriva  respimat as less likely to cause cough than symbicort which he can resume one day prior to next ov 01/16/31  In meantime max rx for gerd with neb saba/ higher dose pred tapering, and mucinex /flutter   Each maintenance medication was reviewed in detail including most importantly the difference between maintenance and as needed and under what circumstances the prns are to be used.  Please see AVS for specific  Instructions which are unique to this visit and I personally typed out  which were reviewed in detail over the phone with the patient and a copy provided via mychart

## 2021-01-12 NOTE — Telephone Encounter (Signed)
Set up for televisit this afternoon and I'll call him when I can work him in

## 2021-01-12 NOTE — Assessment & Plan Note (Signed)
See cxr 01/06/20 post covid 19 (1st symptoms 12/23/20)  rx with zpak/ cefipime x 5 days and then developed purulent sputum again 01/12/21 so rx levcaquin 500 mg daily x 7 days then return for cxr before more as this is c/w acute superimposed CAP or HCAP

## 2021-01-13 ENCOUNTER — Telehealth: Payer: Self-pay | Admitting: Critical Care Medicine

## 2021-01-13 MED ORDER — DOXYCYCLINE HYCLATE 100 MG PO TABS: 100.0000 mg | ORAL_TABLET | Freq: Two times a day (BID) | ORAL | 0 refills | Status: DC

## 2021-01-13 NOTE — Telephone Encounter (Signed)
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 Melvyn Novas yesterday Coughing, couldn't bring anything up. Mucinex was increased. Prednisone prescribed. Started on levofloxacin Thinks Levofloxacin is causing HR fast, BP up and down R sided rib pain- thinks this is from coughing EMS came to the house to check this morning- VS stable per them, saturation around 90%. Didn't go to the ED. Lungs sound "gunky", diminished in bases. Breathing feels about the same- not worse. Breathing treatments are helping. Now able to get sputum up.  Plan: Stop levofloxacin, start doxycyline BID x 7 days (sent to [facility] in [place]) If breathing worse>> ED If HR issues persist >> ED We discussed that he may still have the same symptoms as these could be caused by inhalers.  If the HR and BP issues persist, he needs to stop the breathing treatments as well.  Julian Hy, DO 01/13/21 8:57 AM [facility]

## 2021-01-14 ENCOUNTER — Other Ambulatory Visit: Payer: Self-pay

## 2021-01-14 ENCOUNTER — Emergency Department (HOSPITAL_BASED_OUTPATIENT_CLINIC_OR_DEPARTMENT_OTHER): Payer: Medicare Other

## 2021-01-14 ENCOUNTER — Encounter (HOSPITAL_BASED_OUTPATIENT_CLINIC_OR_DEPARTMENT_OTHER): Payer: Self-pay | Admitting: Emergency Medicine

## 2021-01-14 ENCOUNTER — Observation Stay (HOSPITAL_BASED_OUTPATIENT_CLINIC_OR_DEPARTMENT_OTHER)
Admission: EM | Admit: 2021-01-14 | Discharge: 2021-01-15 | Disposition: A | Discharge status: Home / Self Care | Payer: Medicare Other | Attending: Internal Medicine | Admitting: Internal Medicine

## 2021-01-14 DIAGNOSIS — F411 Generalized anxiety disorder: Secondary | ICD-10-CM | POA: Diagnosis present

## 2021-01-14 DIAGNOSIS — E1169 Type 2 diabetes mellitus with other specified complication: Secondary | ICD-10-CM | POA: Diagnosis not present

## 2021-01-14 DIAGNOSIS — U071 COVID-19: Principal | ICD-10-CM | POA: Insufficient documentation

## 2021-01-14 DIAGNOSIS — R079 Chest pain, unspecified: Secondary | ICD-10-CM | POA: Diagnosis not present

## 2021-01-14 DIAGNOSIS — Z87891 Personal history of nicotine dependence: Secondary | ICD-10-CM | POA: Insufficient documentation

## 2021-01-14 DIAGNOSIS — E782 Mixed hyperlipidemia: Secondary | ICD-10-CM | POA: Diagnosis not present

## 2021-01-14 DIAGNOSIS — E1159 Type 2 diabetes mellitus with other circulatory complications: Secondary | ICD-10-CM | POA: Diagnosis present

## 2021-01-14 DIAGNOSIS — I5032 Chronic diastolic (congestive) heart failure: Secondary | ICD-10-CM | POA: Diagnosis not present

## 2021-01-14 DIAGNOSIS — J441 Chronic obstructive pulmonary disease with (acute) exacerbation: Secondary | ICD-10-CM | POA: Diagnosis not present

## 2021-01-14 DIAGNOSIS — Z8616 Personal history of COVID-19: Secondary | ICD-10-CM | POA: Diagnosis not present

## 2021-01-14 DIAGNOSIS — I10 Essential (primary) hypertension: Secondary | ICD-10-CM | POA: Diagnosis present

## 2021-01-14 DIAGNOSIS — Z79899 Other long term (current) drug therapy: Secondary | ICD-10-CM | POA: Insufficient documentation

## 2021-01-14 DIAGNOSIS — I110 Hypertensive heart disease with heart failure: Secondary | ICD-10-CM | POA: Insufficient documentation

## 2021-01-14 DIAGNOSIS — F419 Anxiety disorder, unspecified: Secondary | ICD-10-CM | POA: Diagnosis not present

## 2021-01-14 DIAGNOSIS — Z7901 Long term (current) use of anticoagulants: Secondary | ICD-10-CM | POA: Diagnosis not present

## 2021-01-14 DIAGNOSIS — E119 Type 2 diabetes mellitus without complications: Secondary | ICD-10-CM | POA: Diagnosis not present

## 2021-01-14 DIAGNOSIS — I480 Paroxysmal atrial fibrillation: Secondary | ICD-10-CM | POA: Insufficient documentation

## 2021-01-14 DIAGNOSIS — R0602 Shortness of breath: Secondary | ICD-10-CM | POA: Diagnosis not present

## 2021-01-14 DIAGNOSIS — R000 Tachycardia, unspecified: Secondary | ICD-10-CM | POA: Diagnosis not present

## 2021-01-14 LAB — URINALYSIS, ROUTINE W REFLEX MICROSCOPIC
Bilirubin Urine: NEGATIVE
Glucose, UA: NEGATIVE mg/dL
Ketones, ur: NEGATIVE mg/dL
Leukocytes,Ua: NEGATIVE
Nitrite: NEGATIVE
Protein, ur: NEGATIVE mg/dL
Specific Gravity, Urine: 1.015 (ref 1.005–1.030)
pH: 7.5 (ref 5.0–8.0)

## 2021-01-14 LAB — CBC WITH DIFFERENTIAL/PLATELET
Abs Immature Granulocytes: 0.11 10*3/uL — ABNORMAL HIGH (ref 0.00–0.07)
Basophils Absolute: 0 10*3/uL (ref 0.0–0.1)
Basophils Relative: 0 %
Eosinophils Absolute: 0.1 10*3/uL (ref 0.0–0.5)
Eosinophils Relative: 1 %
HCT: 38.6 % — ABNORMAL LOW (ref 39.0–52.0)
Hemoglobin: 11.7 g/dL — ABNORMAL LOW (ref 13.0–17.0)
Immature Granulocytes: 1 %
Lymphocytes Relative: 28 %
Lymphs Abs: 4.6 10*3/uL — ABNORMAL HIGH (ref 0.7–4.0)
MCH: 25 pg — ABNORMAL LOW (ref 26.0–34.0)
MCHC: 30.3 g/dL (ref 30.0–36.0)
MCV: 82.5 fL (ref 80.0–100.0)
Monocytes Absolute: 1 10*3/uL (ref 0.1–1.0)
Monocytes Relative: 6 %
Neutro Abs: 10.6 10*3/uL — ABNORMAL HIGH (ref 1.7–7.7)
Neutrophils Relative %: 64 %
Platelets: 313 10*3/uL (ref 150–400)
RBC: 4.68 MIL/uL (ref 4.22–5.81)
RDW: 16.9 % — ABNORMAL HIGH (ref 11.5–15.5)
WBC: 16.4 10*3/uL — ABNORMAL HIGH (ref 4.0–10.5)
nRBC: 0 % (ref 0.0–0.2)

## 2021-01-14 LAB — COMPREHENSIVE METABOLIC PANEL
ALT: 31 U/L (ref 0–44)
AST: 24 U/L (ref 15–41)
Albumin: 3.6 g/dL (ref 3.5–5.0)
Alkaline Phosphatase: 67 U/L (ref 38–126)
Anion gap: 12 (ref 5–15)
BUN: 21 mg/dL (ref 8–23)
CO2: 33 mmol/L — ABNORMAL HIGH (ref 22–32)
Calcium: 9.1 mg/dL (ref 8.9–10.3)
Chloride: 96 mmol/L — ABNORMAL LOW (ref 98–111)
Creatinine, Ser: 0.78 mg/dL (ref 0.61–1.24)
GFR, Estimated: >60 mL/min (ref >60)
Glucose, Bld: 197 mg/dL — ABNORMAL HIGH (ref 70–99)
Potassium: 3.4 mmol/L — ABNORMAL LOW (ref 3.5–5.1)
Sodium: 141 mmol/L (ref 135–145)
Total Bilirubin: 0.4 mg/dL (ref 0.3–1.2)
Total Protein: 7.1 g/dL (ref 6.5–8.1)

## 2021-01-14 LAB — TROPONIN I (HIGH SENSITIVITY)
Troponin I (High Sensitivity): 6 ng/L (ref <18)
Troponin I (High Sensitivity): 6 ng/L (ref <18)

## 2021-01-14 LAB — LACTIC ACID, PLASMA
Lactic Acid, Venous: 2.4 mmol/L (ref 0.5–1.9)
Lactic Acid, Venous: 3.8 mmol/L (ref 0.5–1.9)

## 2021-01-14 LAB — SARS CORONAVIRUS 2 BY RT PCR (HOSPITAL ORDER, PERFORMED IN ~~LOC~~ HOSPITAL LAB): SARS Coronavirus 2: POSITIVE — AB

## 2021-01-14 LAB — URINALYSIS, MICROSCOPIC (REFLEX)

## 2021-01-14 LAB — BRAIN NATRIURETIC PEPTIDE: B Natriuretic Peptide: 68.6 pg/mL (ref 0.0–100.0)

## 2021-01-14 MED ORDER — LORATADINE 10 MG PO TABS
10.0000 mg | ORAL_TABLET | Freq: Every day | ORAL | Status: DC
  Administered 2021-01-14 – 2021-01-15 (×2): 10 mg via ORAL
  Filled 2021-01-14 (×2): qty 1

## 2021-01-14 MED ORDER — ALBUTEROL SULFATE HFA 108 (90 BASE) MCG/ACT IN AERS
2.0000 | INHALATION_SPRAY | RESPIRATORY_TRACT | Status: DC | PRN
  Administered 2021-01-15: 2 via RESPIRATORY_TRACT
  Filled 2021-01-14: qty 6.7

## 2021-01-14 MED ORDER — ACETAMINOPHEN 325 MG PO TABS: 650.0000 mg | ORAL_TABLET | Freq: Four times a day (QID) | ORAL | Status: DC | PRN

## 2021-01-14 MED ORDER — METHYLPREDNISOLONE SODIUM SUCC 125 MG IJ SOLR
125.0000 mg | Freq: Once | INTRAMUSCULAR | Status: AC
  Administered 2021-01-14: 125 mg via INTRAVENOUS
  Filled 2021-01-14: qty 2

## 2021-01-14 MED ORDER — FUROSEMIDE 40 MG PO TABS
40.0000 mg | ORAL_TABLET | Freq: Two times a day (BID) | ORAL | Status: DC
  Administered 2021-01-14 – 2021-01-15 (×2): 40 mg via ORAL
  Filled 2021-01-14 (×2): qty 1

## 2021-01-14 MED ORDER — GUAIFENESIN ER 600 MG PO TB12
600.0000 mg | ORAL_TABLET | Freq: Two times a day (BID) | ORAL | Status: DC
  Administered 2021-01-14 – 2021-01-15 (×2): 600 mg via ORAL
  Filled 2021-01-14 (×2): qty 1

## 2021-01-14 MED ORDER — SODIUM CHLORIDE 0.9% FLUSH: 3.0000 mL | INTRAVENOUS | Status: DC | PRN

## 2021-01-14 MED ORDER — SODIUM CHLORIDE 0.9% FLUSH
3.0000 mL | Freq: Two times a day (BID) | INTRAVENOUS | Status: DC
  Administered 2021-01-14 – 2021-01-15 (×2): 3 mL via INTRAVENOUS

## 2021-01-14 MED ORDER — IPRATROPIUM-ALBUTEROL 20-100 MCG/ACT IN AERS
2.0000 | INHALATION_SPRAY | RESPIRATORY_TRACT | Status: DC
  Administered 2021-01-14 (×3): 2 via RESPIRATORY_TRACT
  Filled 2021-01-14: qty 4

## 2021-01-14 MED ORDER — ALBUTEROL SULFATE HFA 108 (90 BASE) MCG/ACT IN AERS: 2.0000 | INHALATION_SPRAY | Freq: Four times a day (QID) | RESPIRATORY_TRACT | Status: DC | PRN

## 2021-01-14 MED ORDER — ALBUTEROL (5 MG/ML) CONTINUOUS INHALATION SOLN
10.0000 mg/h | INHALATION_SOLUTION | Freq: Once | RESPIRATORY_TRACT | Status: AC
  Administered 2021-01-14: 10 mg/h via RESPIRATORY_TRACT
  Filled 2021-01-14: qty 20

## 2021-01-14 MED ORDER — TIOTROPIUM BROMIDE MONOHYDRATE 2.5 MCG/ACT IN AERS: 2.0000 | INHALATION_SPRAY | Freq: Every day | RESPIRATORY_TRACT | Status: DC

## 2021-01-14 MED ORDER — MOMETASONE FURO-FORMOTEROL FUM 200-5 MCG/ACT IN AERO
2.0000 | INHALATION_SPRAY | Freq: Two times a day (BID) | RESPIRATORY_TRACT | Status: DC
  Administered 2021-01-14 – 2021-01-15 (×2): 2 via RESPIRATORY_TRACT
  Filled 2021-01-14: qty 8.8

## 2021-01-14 MED ORDER — SODIUM CHLORIDE 0.9 % IV BOLUS: 1000.0000 mL | Freq: Once | INTRAVENOUS | Status: DC

## 2021-01-14 MED ORDER — IPRATROPIUM-ALBUTEROL 0.5-2.5 (3) MG/3ML IN SOLN
3.0000 mL | RESPIRATORY_TRACT | Status: DC
  Administered 2021-01-14: 3 mL via RESPIRATORY_TRACT
  Filled 2021-01-14: qty 3

## 2021-01-14 MED ORDER — DOXYCYCLINE HYCLATE 100 MG PO TABS
100.0000 mg | ORAL_TABLET | Freq: Two times a day (BID) | ORAL | Status: DC
  Administered 2021-01-14 – 2021-01-15 (×2): 100 mg via ORAL
  Filled 2021-01-14 (×2): qty 1

## 2021-01-14 MED ORDER — SODIUM CHLORIDE 0.9 % IV SOLN
1.0000 g | Freq: Once | INTRAVENOUS | Status: AC
  Administered 2021-01-14: 1 g via INTRAVENOUS
  Filled 2021-01-14: qty 10

## 2021-01-14 MED ORDER — SERTRALINE HCL 25 MG PO TABS
25.0000 mg | ORAL_TABLET | Freq: Every day | ORAL | Status: DC
  Administered 2021-01-14: 25 mg via ORAL
  Filled 2021-01-14: qty 1

## 2021-01-14 MED ORDER — SODIUM CHLORIDE 0.9 % IV SOLN: 250.0000 mL | INTRAVENOUS | Status: DC | PRN

## 2021-01-14 MED ORDER — PANTOPRAZOLE SODIUM 40 MG PO TBEC
40.0000 mg | DELAYED_RELEASE_TABLET | Freq: Every day | ORAL | Status: DC
  Administered 2021-01-14 – 2021-01-15 (×2): 40 mg via ORAL
  Filled 2021-01-14 (×2): qty 1

## 2021-01-14 MED ORDER — APIXABAN 5 MG PO TABS
5.0000 mg | ORAL_TABLET | Freq: Two times a day (BID) | ORAL | Status: DC
Start: 2021-01-14 — End: 2021-01-15
  Administered 2021-01-14 – 2021-01-15 (×2): 5 mg via ORAL
  Filled 2021-01-14 (×2): qty 1

## 2021-01-14 MED ORDER — INSULIN ASPART 100 UNIT/ML ~~LOC~~ SOLN: 0.0000 [IU] | Freq: Three times a day (TID) | SUBCUTANEOUS | Status: DC

## 2021-01-14 MED ORDER — SODIUM CHLORIDE 0.9 % IV SOLN
500.0000 mg | Freq: Once | INTRAVENOUS | Status: AC
  Administered 2021-01-14: 500 mg via INTRAVENOUS
  Filled 2021-01-14: qty 500

## 2021-01-14 MED ORDER — ATORVASTATIN CALCIUM 40 MG PO TABS
40.0000 mg | ORAL_TABLET | Freq: Every day | ORAL | Status: DC
  Administered 2021-01-14: 40 mg via ORAL
  Filled 2021-01-14: qty 1

## 2021-01-14 MED ORDER — DILTIAZEM HCL ER COATED BEADS 120 MG PO CP24
120.0000 mg | ORAL_CAPSULE | Freq: Every day | ORAL | Status: DC
Start: 2021-01-15 — End: 2021-01-15
  Administered 2021-01-15: 120 mg via ORAL
  Filled 2021-01-14: qty 1

## 2021-01-14 MED ORDER — UMECLIDINIUM BROMIDE 62.5 MCG/INH IN AEPB
1.0000 | INHALATION_SPRAY | Freq: Every day | RESPIRATORY_TRACT | Status: DC
  Filled 2021-01-14: qty 7

## 2021-01-14 MED ORDER — IPRATROPIUM BROMIDE 0.02 % IN SOLN
0.5000 mg | RESPIRATORY_TRACT | Status: AC
  Administered 2021-01-14 (×3): 0.5 mg via RESPIRATORY_TRACT
  Filled 2021-01-14 (×3): qty 2.5

## 2021-01-14 MED ORDER — MELATONIN 5 MG PO TABS
10.0000 mg | ORAL_TABLET | Freq: Every evening | ORAL | Status: DC | PRN
Start: 2021-01-14 — End: 2021-01-15
  Administered 2021-01-14: 10 mg via ORAL
  Filled 2021-01-14: qty 2

## 2021-01-14 MED ORDER — POTASSIUM CHLORIDE CRYS ER 20 MEQ PO TBCR
20.0000 meq | EXTENDED_RELEASE_TABLET | Freq: Once | ORAL | Status: AC
  Administered 2021-01-14: 20 meq via ORAL
  Filled 2021-01-14: qty 1

## 2021-01-14 MED ORDER — BISOPROLOL FUMARATE 5 MG PO TABS
5.0000 mg | ORAL_TABLET | Freq: Every day | ORAL | Status: DC
  Administered 2021-01-15: 5 mg via ORAL
  Filled 2021-01-14: qty 1

## 2021-01-14 NOTE — ED Notes (Signed)
Lactic Acid 3.8 , results given to ED MD and Velna Hatchet RN

## 2021-01-14 NOTE — ED Provider Notes (Signed)
[facility] EMERGENCY DEPARTMENT Provider Note   CSN: [id number] Arrival date & time: 01/14/21  4098     History Chief Complaint  Patient presents with  . Shortness of Breath    Paul [name] is a 75 y.o. male.  HPI      75 year old male with a history of COPD, diabetes, hypertension, hyperlipidemia, atrial fibrillation, COVID-19 diagnosed December 24, 2020 with hospitalization 1/3-1/6 for acute hypoxic respiratory failure discharged on 2L, admission for syncope and pneumonia with discharge 1/18 presents with shortness of breath.   Room to room 2.5lpm low 90s sats, 2lpm not needing O2 at rest, 2.5L walking Pulmonlogy 2 days ago recommended Prednisone taper, levaquin for 7 days, nebulizers, mucinex  Has felt more short of breath over the last few days Increased cough, shortness of breath, wheezing Can't cough up stuff Nebulizers help but then breathing worsens again, didn't help this AM   Has not missed eliquis   Past Medical History:  Diagnosis Date  . A-fib ([place])   . Anemia   . Aortic atherosclerosis ([place])   . Atrial fibrillation ([place])   . Back pain   . COPD (chronic obstructive pulmonary disease) ([place])   . Diabetes mellitus without complication ([place])   . Elevated PSA   . GAD (generalized anxiety disorder)   . High cholesterol   . Hypertension   . Osteoporosis   . Oxygen deficiency     Patient Active Problem List   Diagnosis Date Noted  . Anxiety 01/14/2021  . Lingular pneumonia 01/05/2021  . COVID-19 12/24/2020  . COPD exacerbation ([place]) 12/09/2020  . COPD with acute exacerbation ([place]) 12/08/2020  . Syncope 04/01/2020  . Chronic diastolic CHF (congestive heart failure) ([place]) 04/01/2020  . Osteoporosis with current pathological fracture 02/06/2020  . Essential hypertension 01/28/2020  . Chronic bilateral thoracic back pain 01/25/2020  . 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 HLD (hyperlipidemia) 01/05/2020  .  Vertebral compression fracture ([place]) 05/29/2019  . Chronic anemia 05/29/2019  . Pulmonary nodules 05/29/2019  . Coronary artery calcification 04/06/2019  . Atherosclerosis of aorta ([place]) 04/06/2019  . Paroxysmal atrial fibrillation ([facility])   . Acute on chronic respiratory failure with hypoxia ([place])   . Lung nodule < 6cm on CT 05/29/2016  . Former cigarette smoker 12/05/2015  . COPD GOLD  III      Past Surgical History:  Procedure Laterality Date  . CATARACT EXTRACTION Right   . SKIN SURGERY     shoulders and chest       Family History  Problem Relation Age of Onset  . Heart disease Brother   . Heart disease Mother   . Heart disease Father   . Cancer Paternal Uncle     Social History   Tobacco Use  . Smoking status: Former Smoker    Packs/day: 2.00    Years: 52.00    Pack years: 104.00    Types: Cigarettes    Quit date: 02/03/2009    Years since quitting: 11.9  . Smokeless tobacco: Never Used  . Tobacco comment: Counseled to remain smoke free  Vaping Use  . Vaping Use: Never used  Substance Use Topics  . Alcohol use: Not Currently    Alcohol/week: 0.0 standard drinks    Comment: occ  . Drug use: No    Home Medications Prior to Admission medications   Medication Sig Start Date End Date Taking? Authorizing Provider  acetaminophen (TYLENOL) 325 MG tablet Take 2 tablets (650 mg  total) by mouth every 6 (six) hours as needed for mild pain or headache (fever >/= 101). 12/28/20  Yes Ky Barban, MD  albuterol (PROAIR HFA) 108 (90 Base) MCG/ACT inhaler Inhale 2 puffs into the lungs every 6 (six) hours as needed for wheezing or shortness of breath.    Yes [provider]  albuterol (PROVENTIL) (2.5 MG/3ML) 0.083% nebulizer solution Take 3 mLs (2.5 mg total) by nebulization in the morning and at bedtime. Patient taking differently: Take 2.5 mg by nebulization 4 (four) times daily. 05/09/20  Yes Parrett, Virgel Bouquet, NP  apixaban (ELIQUIS) 5 MG TABS tablet Take 1  tablet (5 mg total) by mouth 2 (two) times daily. 12/27/20  Yes Jake Bathe, MD  ascorbic acid (VITAMIN C) 500 MG tablet Take 1 tablet (500 mg total) by mouth daily. Patient taking differently: Take 1,000 mg by mouth daily. 12/29/20  Yes Sara Chu D, MD  atorvastatin (LIPITOR) 40 MG tablet TAKE 1 TABLET(40 MG) BY MOUTH DAILY AFTER SUPPER Patient taking differently: Take 40 mg by mouth at bedtime. 12/27/19  Yes Jake Bathe, MD  benzonatate (TESSALON) 100 MG capsule Take 100-200 mg by mouth 3 (three) times daily as needed for cough.   Yes [provider]  bisoprolol (ZEBETA) 5 MG tablet Take 1 tablet (5 mg total) by mouth daily. 12/04/20  Yes Jake Bathe, MD  budesonide-formoterol (SYMBICORT) 160-4.5 MCG/ACT inhaler Inhale 2 puffs into the lungs 2 (two) times daily. 07/25/20  Yes Cox, Kirsten, MD  calcium carbonate (OS-CAL) 600 MG TABS tablet Take 600 mg by mouth daily.   Yes [provider]  Cholecalciferol (VITAMIN D) 50 MCG (2000 UT) CAPS Take 2,000 Units by mouth daily.   Yes [provider]  denosumab (PROLIA) 60 MG/ML SOSY injection Inject 60 mg into the skin every 6 (six) months.   Yes [provider]  diltiazem (CARDIZEM CD) 120 MG 24 hr capsule Take 1 capsule (120 mg total) by mouth daily. 12/29/20 01/28/21 Yes Ky Barban, MD  doxycycline (VIBRA-TABS) 100 MG tablet Take 1 tablet (100 mg total) by mouth 2 (two) times daily. 01/13/21  Yes Karie Fetch P, DO  furosemide (LASIX) 40 MG tablet Take 1 tablet (40 mg total) by mouth 2 (two) times daily. 08/23/20  Yes Jake Bathe, MD  guaiFENesin (MUCINEX) 600 MG 12 hr tablet Take 1,200 mg by mouth 2 (two) times daily.   Yes [provider]  loratadine (CLARITIN) 10 MG tablet Take 10 mg by mouth in the morning.   Yes [provider]  Melatonin 5 MG TABS Take 5 mg by mouth at bedtime.   Yes [provider]  Multiple Vitamin (MULTIVITAMIN WITH MINERALS) TABS tablet Take 1  tablet by mouth daily.   Yes [provider]  niacinamide 500 MG tablet Take 500 mg by mouth daily.   Yes [provider]  omeprazole (PRILOSEC OTC) 20 MG tablet Take 40 mg by mouth 2 (two) times daily before a meal.   Yes [provider]  OXYGEN Inhale 2 L/min into the lungs See admin instructions. Inhale 2 L/min of oxygen into the lungs at bedtime and when ambulatory   Yes [provider]  predniSONE (DELTASONE) 5 MG tablet Take 20-40 mg by mouth See admin instructions. Take 40 mg by mouth with food daily for three days and decrease by 2.5 mg every three days until at a dose of 20 mg daily. Remain at 20 mg daily until  follow-up appt with pulmonologist. 01/09/21  Yes [provider]  sodium chloride (OCEAN) 0.65 % nasal spray Place 1 spray into the nose as needed for congestion.   Yes [provider]  Tiotropium Bromide Monohydrate (SPIRIVA RESPIMAT) 2.5 MCG/ACT AERS Inhale 2 puffs into the lungs daily. 10/03/20  Yes Tanda Rockers, MD  levofloxacin (LEVAQUIN) 500 MG tablet Take 1 tablet (500 mg total) by mouth daily. Patient not taking: Reported on 01/14/2021 01/12/21   Tanda Rockers, MD  omeprazole (PRILOSEC) 20 MG capsule Take 30- 60 min before your first and last meals of the day Patient not taking: Reported on 01/14/2021 10/03/20   Tanda Rockers, MD  predniSONE (DELTASONE) 10 MG tablet Take 4 tablets (40 mg total) by mouth daily with breakfast for 2 days, THEN 4 tablets (40 mg total) daily with breakfast for 3 days, THEN 3.5 tablets (35 mg total) daily with breakfast for 3 days, THEN 3.5 tablets (35 mg total) daily with breakfast for 3 days, THEN 3 tablets (30 mg total) daily with breakfast for 3 days, THEN 3 tablets (30 mg total) daily with breakfast for 3 days, THEN 2.5 tablets (25 mg total) daily with breakfast for 3 days, THEN 2.5 tablets (25 mg total) daily with breakfast for 3 days, THEN 2 tablets (20 mg total) daily with breakfast for 3  days. 01/09/21 02/04/21  Harold Hedge, MD    Allergies    Daliresp [roflumilast], Alendronate, and Levaquin [levofloxacin]  Review of Systems   Review of Systems  Constitutional: Negative for fever.  HENT: Negative for sore throat.   Eyes: Negative for visual disturbance.  Respiratory: Positive for cough and shortness of breath.   Cardiovascular: Positive for chest pain (right sided).  Gastrointestinal: Negative for abdominal pain.  Genitourinary: Negative for difficulty urinating.  Musculoskeletal: Negative for back pain and neck stiffness.  Skin: Negative for rash.  Neurological: Negative for syncope and headaches.    Physical Exam Updated Vital Signs BP 125/64 (BP Location: Right Arm)   Pulse 100   Temp 98.2 F (36.8 C) (Oral)   Resp 14   Ht 5\' 6"  (1.676 m)   Wt 67.7 kg   SpO2 94%   BMI 24.10 kg/m   Physical Exam Vitals and nursing note reviewed.  Constitutional:      General: He is not in acute distress.    Appearance: He is well-developed and well-nourished. He is not diaphoretic.  HENT:     Head: Normocephalic and atraumatic.  Eyes:     Extraocular Movements: EOM normal.     Conjunctiva/sclera: Conjunctivae normal.  Cardiovascular:     Rate and Rhythm: Regular rhythm. Tachycardia present.     Pulses: Intact distal pulses.     Heart sounds: Normal heart sounds. No murmur heard. No friction rub. No gallop.   Pulmonary:     Effort: Tachypnea and accessory muscle usage present. No respiratory distress.     Breath sounds: Wheezing present. No rales.  Abdominal:     General: There is no distension.     Palpations: Abdomen is soft.     Tenderness: There is no abdominal tenderness. There is no guarding.  Musculoskeletal:        General: No edema.     Cervical back: Normal range of motion.  Skin:    General: Skin is warm and dry.  Neurological:     Mental Status: He is alert and oriented to person, place, and time.     ED  Results / Procedures / Treatments    Labs (all labs ordered are listed, but only abnormal results are displayed) Labs Reviewed  SARS CORONAVIRUS 2 BY RT PCR (HOSPITAL ORDER, [facility]) - Abnormal; Notable for the following components:      Result Value   SARS Coronavirus 2 POSITIVE (*)    All other components within normal limits  LACTIC ACID, PLASMA - Abnormal; Notable for the following components:   Lactic Acid, Venous 2.4 (*)    All other components within normal limits  LACTIC ACID, PLASMA - Abnormal; Notable for the following components:   Lactic Acid, Venous 3.8 (*)    All other components within normal limits  CBC WITH DIFFERENTIAL/PLATELET - Abnormal; Notable for the following components:   WBC 16.4 (*)    Hemoglobin 11.7 (*)    HCT 38.6 (*)    MCH 25.0 (*)    RDW 16.9 (*)    Neutro Abs 10.6 (*)    Lymphs Abs 4.6 (*)    Abs Immature Granulocytes 0.11 (*)    All other components within normal limits  COMPREHENSIVE METABOLIC PANEL - Abnormal; Notable for the following components:   Potassium 3.4 (*)    Chloride 96 (*)    CO2 33 (*)    Glucose, Bld 197 (*)    All other components within normal limits  URINALYSIS, ROUTINE W REFLEX MICROSCOPIC - Abnormal; Notable for the following components:   Hgb urine dipstick MODERATE (*)    All other components within normal limits  URINALYSIS, MICROSCOPIC (REFLEX) - Abnormal; Notable for the following components:   Bacteria, UA RARE (*)    All other components within normal limits  CULTURE, BLOOD (ROUTINE X 2)  CULTURE, BLOOD (ROUTINE X 2)  BRAIN NATRIURETIC PEPTIDE  BASIC METABOLIC PANEL  CBC WITH DIFFERENTIAL/PLATELET  C-REACTIVE PROTEIN  FERRITIN  LACTATE DEHYDROGENASE  TROPONIN I (HIGH SENSITIVITY)  TROPONIN I (HIGH SENSITIVITY)    EKG EKG Interpretation  Date/Time:  Sunday January 14 2021 08:55:21 EST Ventricular Rate:  110 PR Interval:    QRS Duration: 97 QT Interval:  335 QTC Calculation: 454 R Axis:   70 Text  Interpretation: Sinus tachycardia Multiple premature complexes, vent & supraven Probable anteroseptal infarct, old Minimal ST depression, diffuse leads Since prior ECG< rate has increased Confirmed by Gareth Morgan [phone]) on 01/14/2021 9:06:31 AM   Radiology DG Chest Portable 1 View  Result Date: 01/14/2021 CLINICAL DATA:  75 year old male with history of shortness of breath and chest pain since yesterday evening. EXAM: PORTABLE CHEST 1 VIEW COMPARISON:  Chest x-ray 01/05/2021. FINDINGS: Lung volumes are normal. No consolidative airspace disease. No pleural effusions. No pneumothorax. Small calcified granulomas in the right upper lobe, similar to the prior study. No other pulmonary nodule or mass noted. Pulmonary vasculature and the cardiomediastinal silhouette are within normal limits. Atherosclerosis in the thoracic aorta. IMPRESSION: 1. No radiographic evidence of acute cardiopulmonary disease. 2. Aortic atherosclerosis. Electronically Signed   By: Vinnie Langton M.D.   On: 01/14/2021 10:45    Procedures Procedures (including critical care time)  Medications Ordered in ED Medications  Ipratropium-Albuterol (COMBIVENT) respimat 2 puff (2 puffs Inhalation Given 01/14/21 2217)  albuterol (VENTOLIN HFA) 108 (90 Base) MCG/ACT inhaler 2 puff (has no administration in time range)  sodium chloride 0.9 % bolus 1,000 mL (1,000 mLs Intravenous Not Given 01/14/21 1643)  sodium chloride 0.9 % bolus 1,000 mL (1,000 mLs Intravenous Not Given 01/14/21 1642)  acetaminophen (TYLENOL) tablet 650  mg (has no administration in time range)  doxycycline (VIBRA-TABS) tablet 100 mg (100 mg Oral Given 01/14/21 2116)  atorvastatin (LIPITOR) tablet 40 mg (40 mg Oral Given 01/14/21 2114)  bisoprolol (ZEBETA) tablet 5 mg (has no administration in time range)  diltiazem (CARDIZEM CD) 24 hr capsule 120 mg (has no administration in time range)  furosemide (LASIX) tablet 40 mg (40 mg Oral Given 01/14/21 2115)  pantoprazole  (PROTONIX) EC tablet 40 mg (40 mg Oral Given 01/14/21 2115)  apixaban (ELIQUIS) tablet 5 mg (5 mg Oral Given 01/14/21 2116)  melatonin tablet 10 mg (10 mg Oral Given 01/14/21 2114)  mometasone-formoterol (DULERA) 200-5 MCG/ACT inhaler 2 puff (2 puffs Inhalation Given 01/14/21 2218)  guaiFENesin (MUCINEX) 12 hr tablet 600 mg (600 mg Oral Given 01/14/21 2114)  loratadine (CLARITIN) tablet 10 mg (10 mg Oral Given 01/14/21 2114)  sodium chloride flush (NS) 0.9 % injection 3 mL (3 mLs Intravenous Given 01/14/21 2117)  sodium chloride flush (NS) 0.9 % injection 3 mL (has no administration in time range)  0.9 %  sodium chloride infusion (has no administration in time range)  insulin aspart (novoLOG) injection 0-20 Units (has no administration in time range)  sertraline (ZOLOFT) tablet 25 mg (25 mg Oral Given 01/14/21 2114)  umeclidinium bromide (INCRUSE ELLIPTA) 62.5 MCG/INH 1 puff (1 puff Inhalation Not Given 01/14/21 2118)  methylPREDNISolone sodium succinate (SOLU-MEDROL) 125 mg/2 mL injection 125 mg (125 mg Intravenous Given 01/14/21 1016)  albuterol (PROVENTIL,VENTOLIN) solution continuous neb (10 mg/hr Nebulization Given 01/14/21 0944)  ipratropium (ATROVENT) nebulizer solution 0.5 mg (0.5 mg Nebulization Given 01/14/21 1022)  azithromycin (ZITHROMAX) 500 mg in sodium chloride 0.9 % 250 mL IVPB (500 mg Intravenous New Bag/Given 01/14/21 1336)  cefTRIAXone (ROCEPHIN) 1 g in sodium chloride 0.9 % 100 mL IVPB (0 g Intravenous Stopped 01/14/21 1340)  potassium chloride SA (KLOR-CON) CR tablet 20 mEq (20 mEq Oral Given 01/14/21 2128)    ED Course  I have reviewed the triage vital signs and the nursing notes.  Pertinent labs & imaging results that were available during my care of the patient were reviewed by me and considered in my medical decision making (see chart for details).    MDM Rules/Calculators/A&P                          75 year old male with a history of COPD, diabetes, hypertension,  hyperlipidemia, atrial fibrillation, COVID-19 diagnosed December 24, 2020 with hospitalization 1/3-1/6 for acute hypoxic respiratory failure discharged on 2L, admission for syncope and pneumonia with discharge 1/18 presents with shortness of breath. Differential diagnosis for dyspnea includes ACS, PE, COPD exacerbation, CHF exacerbation, anemia, pneumonia, viral etiology such as COVID 19 infection, metabolic abnormality.  Chest x-ray was done which showed no acute abnormalities. EKG was evaluated by me which showed sinus tachycardia.  BNP was normal.  He has been adherent to anticoagulation and have low suspicion for PE.  Troponin negative doubt ACS.  Covid infection was 3 weeks ago. History and exam at this time consistent with COPD exacerbation.  Given solumedrol, continuous albuterol/atrovent with improvement but continuing symptoms and given failure of outpatient therapies and worsening will admit for COPD exacerbation. Given abx for COPD, given negative cxr doubt pneumonia. Lactic acidosis likely secondary to respiratory distress, leukocytosis may be secondary to steroids.  Admitted for further care.      Final Clinical Impression(s) / ED Diagnoses Final diagnoses:  COPD exacerbation ([place])    Rx /  DC Orders ED Discharge Orders    None       Gareth Morgan, MD 01/14/21 2248

## 2021-01-14 NOTE — ED Notes (Signed)
ED Provider at bedside. 

## 2021-01-14 NOTE — ED Notes (Signed)
ED TO INPATIENT HANDOFF REPORT  ED Nurse Name and Phone #: [phone]  S Name/Age/Gender Paul [name] 75 y.o. male Room/Bed: MH05/MH05  Code Status   Code Status: Prior  Home/SNF/Other Home Patient oriented to: self Is this baseline? Yes   Triage Complete: Triage complete  Chief Complaint COPD exacerbation ([place]) [J44.1]  Triage Note Pt arrives with son, with c/o shob and CP that started last night. Pt Covid + 12/24/20    Allergies Allergies  Allergen Reactions  . Daliresp [Roflumilast]     Nausea, vomiting and diarrhea.  . Alendronate Other (See Comments)    Jittery/nervous    Level of Care/Admitting Diagnosis ED Disposition    ED Disposition Condition Comment   Admit  Hospital Area: [facility] [[id number]]  Level of Care: Med-Surg [16]  May admit patient to [facility] or [facility] if equivalent level of care is available:: Yes  Covid Evaluation: Recent COVID positive no isolation required infection day 21-90  Diagnosis: COPD exacerbation [facility]) [id number]  Admitting Physician: Neena Rhymes [[id number]]  Attending Physician: Adella Hare E [[id number]]  Estimated length of stay: past midnight tomorrow  Certification:: I certify this patient will need inpatient services for at least 2 midnights       B Medical/Surgery History Past Medical History:  Diagnosis Date  . A-fib ([place])   . Anemia   . Aortic atherosclerosis ([place])   . Atrial fibrillation ([place])   . Back pain   . COPD (chronic obstructive pulmonary disease) ([place])   . Diabetes mellitus without complication ([place])   . Elevated PSA   . GAD (generalized anxiety disorder)   . High cholesterol   . Hypertension   . Osteoporosis   . Oxygen deficiency    Past Surgical History:  Procedure Laterality Date  . CATARACT EXTRACTION Right   . SKIN SURGERY     shoulders and chest     A IV Location/Drains/Wounds Patient Lines/Drains/Airways Status    Active Line/Drains/Airways    Name Placement  date Placement time Site Days   Peripheral IV 01/14/21 Left Antecubital 01/14/21  0854  Antecubital  less than 1   Peripheral IV 01/14/21 Right Wrist 01/14/21  1335  Wrist  less than 1   External Urinary Catheter 01/14/21  1335  --  less than 1   Incision (Closed) 01/05/21 Head Posterior;Mid;Lower 01/05/21  2105  -- 9          Intake/Output Last 24 hours  Intake/Output Summary (Last 24 hours) at 01/14/2021 1459 Last data filed at 01/14/2021 1340 Gross per 24 hour  Intake 71.33 ml  Output 600 ml  Net -528.67 ml    Labs/Imaging Results for orders placed or performed during the hospital encounter of 01/14/21 (from the past 48 hour(s))  CBC with Differential     Status: Abnormal   Collection Time: 01/14/21  8:58 AM  Result Value Ref Range   WBC 16.4 (H) 4.0 - 10.5 K/uL   RBC 4.68 4.22 - 5.81 MIL/uL   Hemoglobin 11.7 (L) 13.0 - 17.0 g/dL   HCT 38.6 (L) 39.0 - 52.0 %   MCV 82.5 80.0 - 100.0 fL   MCH 25.0 (L) 26.0 - 34.0 pg   MCHC 30.3 30.0 - 36.0 g/dL   RDW 16.9 (H) 11.5 - 15.5 %   Platelets 313 150 - 400 K/uL   nRBC 0.0 0.0 - 0.2 %   Neutrophils Relative % 64 %   Neutro Abs 10.6 (H) 1.7 - 7.7 K/uL  Lymphocytes Relative 28 %   Lymphs Abs 4.6 (H) 0.7 - 4.0 K/uL   Monocytes Relative 6 %   Monocytes Absolute 1.0 0.1 - 1.0 K/uL   Eosinophils Relative 1 %   Eosinophils Absolute 0.1 0.0 - 0.5 K/uL   Basophils Relative 0 %   Basophils Absolute 0.0 0.0 - 0.1 K/uL   Immature Granulocytes 1 %   Abs Immature Granulocytes 0.11 (H) 0.00 - 0.07 K/uL    Comment: Performed at [facility], [address] [place], [place] [address]  Comprehensive metabolic panel     Status: Abnormal   Collection Time: 01/14/21  8:58 AM  Result Value Ref Range   Sodium 141 135 - 145 mmol/L   Potassium 3.4 (L) 3.5 - 5.1 mmol/L   Chloride 96 (L) 98 - 111 mmol/L   CO2 33 (H) 22 - 32 mmol/L   Glucose, Bld 197 (H) 70 - 99 mg/dL    Comment: Glucose reference range applies only to samples taken  after fasting for at least 8 hours.   BUN 21 8 - 23 mg/dL   Creatinine, Ser 0.78 0.61 - 1.24 mg/dL   Calcium 9.1 8.9 - 10.3 mg/dL   Total Protein 7.1 6.5 - 8.1 g/dL   Albumin 3.6 3.5 - 5.0 g/dL   AST 24 15 - 41 U/L   ALT 31 0 - 44 U/L   Alkaline Phosphatase 67 38 - 126 U/L   Total Bilirubin 0.4 0.3 - 1.2 mg/dL   GFR, Estimated >60 >60 mL/min    Comment: (NOTE) Calculated using the CKD-EPI Creatinine Equation (2021)    Anion gap 12 5 - 15    Comment: Performed at [facility], [address] [place], [place] [address]  Troponin I (High Sensitivity)     Status: None   Collection Time: 01/14/21  8:58 AM  Result Value Ref Range   Troponin I (High Sensitivity) 6 <18 ng/L    Comment: (NOTE) Elevated high sensitivity troponin I (hsTnI) values and significant  changes across serial measurements may suggest ACS but many other  chronic and acute conditions are known to elevate hsTnI results.  Refer to the "Links" section for chest pain algorithms and additional  guidance. Performed at [facility], [address] [place], [place] [address]   Brain natriuretic peptide     Status: None   Collection Time: 01/14/21  8:58 AM  Result Value Ref Range   B Natriuretic Peptide 68.6 0.0 - 100.0 pg/mL    Comment: Performed at [facility], [address] [place], [place] [address]  Lactic acid, plasma     Status: Abnormal   Collection Time: 01/14/21  9:55 AM  Result Value Ref Range   Lactic Acid, Venous 2.4 (HH) 0.5 - 1.9 mmol/L    Comment: CRITICAL RESULT CALLED TO, READ BACK BY AND VERIFIED WITH: PEEL A RN [id number] PHILLIPS C Performed at [facility], [address] [place], [place] [address]   Urinalysis, Routine w reflex microscopic Urine, Clean Catch     Status: Abnormal   Collection Time: 01/14/21 11:00 AM  Result Value Ref Range   Color, Urine YELLOW YELLOW   APPearance CLEAR CLEAR   Specific Gravity, Urine 1.015 1.005 - 1.030   pH  7.5 5.0 - 8.0   Glucose, UA NEGATIVE NEGATIVE mg/dL   Hgb urine dipstick MODERATE (A) NEGATIVE   Bilirubin Urine NEGATIVE NEGATIVE  Ketones, ur NEGATIVE NEGATIVE mg/dL   Protein, ur NEGATIVE NEGATIVE mg/dL   Nitrite NEGATIVE NEGATIVE   Leukocytes,Ua NEGATIVE NEGATIVE    Comment: Performed at [facility], [address] [place], [place] [address]  Urinalysis, Microscopic (reflex)     Status: Abnormal   Collection Time: 01/14/21 11:00 AM  Result Value Ref Range   RBC / HPF 21-50 0 - 5 RBC/hpf   WBC, UA 0-5 0 - 5 WBC/hpf   Bacteria, UA RARE (A) NONE SEEN   Squamous Epithelial / LPF 0-5 0 - 5    Comment: Performed at [facility], [address] [place], [place] [address]  SARS Coronavirus 2 by RT PCR (hospital order, performed in [facility] hospital lab) Nasopharyngeal Nasopharyngeal Swab     Status: Abnormal   Collection Time: 01/14/21 12:29 PM   Specimen: Nasopharyngeal Swab  Result Value Ref Range   SARS Coronavirus 2 POSITIVE (A) NEGATIVE    Comment: RESULT CALLED TO, READ BACK BY AND VERIFIED WITH: Josph Macho RN [id number] PHILLIPS C (NOTE) SARS-CoV-2 target nucleic acids are DETECTED  SARS-CoV-2 RNA is generally detectable in upper respiratory specimens  during the acute phase of infection.  Positive results are indicative  of the presence of the identified virus, but do not rule out bacterial infection or co-infection with other pathogens not detected by the test.  Clinical correlation with patient history and  other diagnostic information is necessary to determine patient infection status.  The expected result is negative.  Fact Sheet for Patients:   StrictlyIdeas.no   Fact Sheet for Healthcare Providers:   BankingDealers.co.za    This test is not yet approved or cleared by the [place] FDA and  has been authorized for detection and/or diagnosis of SARS-CoV-2 by FDA under an Emergency Use  Authorization (EUA).  This EUA will remain in effect (meaning this tes t can be used) for the duration of  the COVID-19 declaration under Section 564(b)(1) of the Act, 21 U.S.C. section 360-bbb-3(b)(1), unless the authorization is terminated or revoked sooner.  Performed at [facility], [address] [place], [place] [address]   Lactic acid, plasma     Status: Abnormal   Collection Time: 01/14/21  1:09 PM  Result Value Ref Range   Lactic Acid, Venous 3.8 (HH) 0.5 - 1.9 mmol/L    Comment: CRITICAL RESULT CALLED TO, READ BACK BY AND VERIFIED WITH: Josph Macho, RN AT 5400 ON [id number] BY Asher Muir Performed at [facility], [address] [place], [place] [address]   Troponin I (High Sensitivity)     Status: None   Collection Time: 01/14/21  1:09 PM  Result Value Ref Range   Troponin I (High Sensitivity) 6 <18 ng/L    Comment: (NOTE) Elevated high sensitivity troponin I (hsTnI) values and significant  changes across serial measurements may suggest ACS but many other  chronic and acute conditions are known to elevate hsTnI results.  Refer to the "Links" section for chest pain algorithms and additional  guidance. Performed at [facility], [address] [place], [place] [address]    DG Chest Portable 1 View  Result Date: 01/14/2021 CLINICAL DATA:  75 year old male with history of shortness of breath and chest pain since yesterday evening. EXAM: PORTABLE CHEST 1 VIEW COMPARISON:  Chest x-ray 01/05/2021. FINDINGS: Lung volumes are normal. No consolidative airspace disease. No pleural effusions. No pneumothorax. Small calcified granulomas  in the right upper lobe, similar to the prior study. No other pulmonary nodule or mass noted. Pulmonary vasculature and the cardiomediastinal silhouette are within normal limits. Atherosclerosis in the thoracic aorta. IMPRESSION: 1. No radiographic evidence of acute cardiopulmonary disease. 2. Aortic atherosclerosis.  Electronically Signed   By: Vinnie Langton M.D.   On: 01/14/2021 10:45    Pending Labs Unresulted Labs (From admission, onward)          Start     Ordered   01/14/21 0935  Blood culture (routine x 2)  BLOOD CULTURE X 2,   STAT      01/14/21 0935          Vitals/Pain Today's Vitals   01/14/21 1030 01/14/21 1100 01/14/21 1221 01/14/21 1320  BP: 136/74  127/64   Pulse: (!) 109 (!) 113 (!) 107   Resp: (!) 25 (!) 24 18   Temp:   98.4 F (36.9 C)   TempSrc:   Oral   SpO2: 97% 94% 95% 98%  Weight:      Height:      PainSc:        Isolation Precautions Airborne and Contact precautions  Medications Medications  Ipratropium-Albuterol (COMBIVENT) respimat 2 puff (has no administration in time range)  albuterol (VENTOLIN HFA) 108 (90 Base) MCG/ACT inhaler 2 puff (has no administration in time range)  methylPREDNISolone sodium succinate (SOLU-MEDROL) 125 mg/2 mL injection 125 mg (125 mg Intravenous Given 01/14/21 1016)  albuterol (PROVENTIL,VENTOLIN) solution continuous neb (10 mg/hr Nebulization Given 01/14/21 0944)  ipratropium (ATROVENT) nebulizer solution 0.5 mg (0.5 mg Nebulization Given 01/14/21 1022)  azithromycin (ZITHROMAX) 500 mg in sodium chloride 0.9 % 250 mL IVPB (500 mg Intravenous New Bag/Given 01/14/21 1336)  cefTRIAXone (ROCEPHIN) 1 g in sodium chloride 0.9 % 100 mL IVPB (0 g Intravenous Stopped 01/14/21 1340)    Mobility walks with device Moderate fall risk   Focused Assessments Pulmonary Assessment Handoff:  Lung sounds: Bilateral Breath Sounds: Diminished O2 Device: Nasal Cannula O2 Flow Rate (L/min): 2 L/min      R Recommendations: See Admitting Provider Note  Report given to:   Additional Notes:

## 2021-01-14 NOTE — ED Notes (Signed)
RT and MD had decided that nebs would be more beneficial for this patient. His COVID was positive at the beginning of this month, so technically he is out of his window. Will switch back to MDIs for admission

## 2021-01-14 NOTE — H&P (Signed)
History and Physical    Paul [name] QAS:[id number] DOB: 14-Jul-1946 DOA: 01/14/2021  PCP: [name], MD (Confirm with patient/family/NH records and if not entered, this has to be entered at [facility] point of entry) Patient coming from: Home[facility]  I have personally briefly reviewed patient's old medical records in [place]  Chief Complaint: Increasing SOB, feeling tight in his chest  HPI: Paul [name] is a 75 y.o. male with medical history significant of COPD, A fib, DM, HTN. He was admitted Jan 2, 22 fro covid pna, treated successfulling and d/c home. He was readmitted 1/14-1/18/22 for bacterial pneumonia tx'd with abx. He was seen by Dr. Melvyn Novas, pulmonary, 01/12/21 for increased SOB/DOE at which time he was placed on increased steroids and abx with Levaquin. The patient had an adverse reaction to Levaquin and was switched to doxycycline for COPD exacerbation. He continued to have SOB, chest tightness and presented to [facility].   ED Course: T98.2  151/66  HR 102  RR 22. He appeared SOB but sats were in low 90s. He was treated with continuous albuterol nebulizer, was administered Rocephin 2g and azithromycin 500 mg. Labs at [facility] with glucose 197, BNP 68, WBC 16.4 with a nl differential. CXR read as NAD. EKG with sinus tachycardia. Transfer to [facility] for treatment of COPD exacerbation was requested and [facility] accepted.   Review of Systems: As per HPI otherwise 10 point review of systems negative.    Past Medical History:  Diagnosis Date  . A-fib ([place])   . Anemia   . Aortic atherosclerosis ([place])   . Atrial fibrillation ([place])   . Back pain   . COPD (chronic obstructive pulmonary disease) ([place])   . Diabetes mellitus without complication ([place])   . Elevated PSA   . GAD (generalized anxiety disorder)   . High cholesterol   . Hypertension   . Osteoporosis   . Oxygen deficiency     Past Surgical History:  Procedure Laterality Date  . CATARACT EXTRACTION Right   . SKIN SURGERY     shoulders  and chest     reports that he quit smoking about 11 years ago. His smoking use included cigarettes. He has a 104.00 pack-year smoking history. He has never used smokeless tobacco. He reports previous alcohol use. He reports that he does not use drugs.  Allergies  Allergen Reactions  . Daliresp [Roflumilast]     Nausea, vomiting and diarrhea.  . Alendronate Other (See Comments)    Jittery/nervous    Family History  Problem Relation Age of Onset  . Heart disease Brother   . Heart disease Mother   . Heart disease Father   . Cancer Paternal Uncle      Prior to Admission medications   Medication Sig Start Date End Date Taking? Authorizing Provider  acetaminophen (TYLENOL) 325 MG tablet Take 2 tablets (650 mg total) by mouth every 6 (six) hours as needed for mild pain or headache (fever >/= 101). 12/28/20   Blain Pais, MD  albuterol (PROAIR HFA) 108 (90 Base) MCG/ACT inhaler Inhale 2 puffs into the lungs every 6 (six) hours as needed for wheezing or shortness of breath.     [provider]  albuterol (PROVENTIL) (2.5 MG/3ML) 0.083% nebulizer solution Take 3 mLs (2.5 mg total) by nebulization in the morning and at bedtime. Patient taking differently: Take 2.5 mg by nebulization 2 (two) times daily as needed. 05/09/20   Parrett, Fonnie Mu, NP  apixaban (ELIQUIS) 5 MG TABS tablet Take  1 tablet (5 mg total) by mouth 2 (two) times daily. 12/27/20   Jerline Pain, MD  ascorbic acid (VITAMIN C) 500 MG tablet Take 1 tablet (500 mg total) by mouth daily. 12/29/20   Blain Pais, MD  atorvastatin (LIPITOR) 40 MG tablet TAKE 1 TABLET(40 MG) BY MOUTH DAILY AFTER SUPPER Patient taking differently: Take 40 mg by mouth daily. 12/27/19   Jerline Pain, MD  bisoprolol (ZEBETA) 5 MG tablet Take 1 tablet (5 mg total) by mouth daily. 12/04/20   Jerline Pain, MD  budesonide-formoterol (SYMBICORT) 160-4.5 MCG/ACT inhaler Inhale 2 puffs into the lungs 2 (two) times daily. 07/25/20   Cox,  Elnita Maxwell, MD  calcium carbonate (OS-CAL) 600 MG TABS tablet Take 600 mg by mouth daily.    [provider]  Cholecalciferol (VITAMIN D) 50 MCG (2000 UT) CAPS Take 2,000 Units by mouth daily.    [provider]  denosumab (PROLIA) 60 MG/ML SOSY injection Inject 60 mg into the skin every 6 (six) months.    [provider]  diltiazem (CARDIZEM CD) 120 MG 24 hr capsule Take 1 capsule (120 mg total) by mouth daily. 12/29/20 01/28/21  Blain Pais, MD  doxycycline (VIBRA-TABS) 100 MG tablet Take 1 tablet (100 mg total) by mouth 2 (two) times daily. 01/13/21   Julian Hy, DO  furosemide (LASIX) 40 MG tablet Take 1 tablet (40 mg total) by mouth 2 (two) times daily. 08/23/20   Jerline Pain, MD  guaiFENesin (MUCINEX) 600 MG 12 hr tablet Take 600 mg by mouth 2 (two) times daily.    [provider]  levofloxacin (LEVAQUIN) 500 MG tablet Take 1 tablet (500 mg total) by mouth daily. 01/12/21   Tanda Rockers, MD  loratadine (CLARITIN) 10 MG tablet Take 10 mg by mouth daily.    [provider]  Melatonin 5 MG TABS Take 10 mg by mouth at bedtime as needed (sleep).    [provider]  Multiple Vitamin (MULTIVITAMIN WITH MINERALS) TABS tablet Take 1 tablet by mouth daily.    [provider]  Niacin (VITAMIN B-3 PO) Take 1 tablet by mouth daily.    [provider]  omeprazole (PRILOSEC) 20 MG capsule Take 30- 60 min before your first and last meals of the day Patient taking differently: Take 20 mg by mouth daily as needed (acid reflux). 10/03/20   Tanda Rockers, MD  OXYGEN Inhale 2 L into the lungs daily. Use as directed at night time and when up and walking around.    [provider]  predniSONE (DELTASONE) 10 MG tablet Take 4 tablets (40 mg total) by mouth daily with breakfast for 2 days, THEN 4 tablets (40 mg total) daily with breakfast for 3 days, THEN 3.5 tablets (35 mg total) daily with breakfast for 3 days, THEN 3.5 tablets  (35 mg total) daily with breakfast for 3 days, THEN 3 tablets (30 mg total) daily with breakfast for 3 days, THEN 3 tablets (30 mg total) daily with breakfast for 3 days, THEN 2.5 tablets (25 mg total) daily with breakfast for 3 days, THEN 2.5 tablets (25 mg total) daily with breakfast for 3 days, THEN 2 tablets (20 mg total) daily with breakfast for 3 days. 01/09/21 02/04/21  Harold Hedge, MD  Tiotropium Bromide Monohydrate (SPIRIVA RESPIMAT) 2.5 MCG/ACT AERS Inhale 2 puffs into the lungs daily. 10/03/20   Tanda Rockers, MD    Physical Exam: Vitals:   01/14/21  1221 01/14/21 1320 01/14/21 1637 01/14/21 1945  BP: 127/64  (!) 151/66 125/64  Pulse: (!) 107  (!) 102 100  Resp: 18  (!) 22 14  Temp: 98.4 F (36.9 C)  98.2 F (36.8 C) 98.2 F (36.8 C)  TempSrc: Oral  Oral   SpO2: 95% 98%  94%  Weight:      Height:         Vitals:   01/14/21 1221 01/14/21 1320 01/14/21 1637 01/14/21 1945  BP: 127/64  (!) 151/66 125/64  Pulse: (!) 107  (!) 102 100  Resp: 18  (!) 22 14  Temp: 98.4 F (36.9 C)  98.2 F (36.8 C) 98.2 F (36.8 C)  TempSrc: Oral  Oral   SpO2: 95% 98%  94%  Weight:      Height:       General: WNWD man in no distress at time of exam, Eyes: PERRL, lids and conjunctivae normal ENMT: Mucous membranes are moist. .  Neck: normal, supple, no masses, no thyromegaly Respiratory: clear to auscultation bilaterally, no wheezing, no crackles. Normal respiratory effort. No accessory muscle use.  Cardiovascular: Regular tachycardia, no murmurs / rubs / gallops. No extremity edema. 2+ pedal pulses. No carotid bruits.  Abdomen: protruberant,no tenderness, no masses palpated. No hepatosplenomegaly. Bowel sounds positive.  Musculoskeletal: no clubbing / cyanosis. No joint deformity upper and lower extremities. Good ROM, no contractures. Normal muscle tone.  Skin: no rashes, lesions, ulcers. No induration Neurologic: CN 2-12 grossly intact. Sensation intact, DTR normal. Strength 5/5 in  all 4.  Psychiatric: Normal judgment and insight. Alert and oriented x 3. Normal mood.     Labs on Admission: I have personally reviewed following labs and imaging studies  CBC: Recent Labs  Lab 01/14/21 0858  WBC 16.4*  NEUTROABS 10.6*  HGB 11.7*  HCT 38.6*  MCV 82.5  PLT [id number]   Basic Metabolic Panel: Recent Labs  Lab 01/14/21 0858  NA 141  K 3.4*  CL 96*  CO2 33*  GLUCOSE 197*  BUN 21  CREATININE 0.78  CALCIUM 9.1   GFR: Estimated Creatinine Clearance: 72 mL/min (by C-G formula based on SCr of 0.78 mg/dL). Liver Function Tests: Recent Labs  Lab 01/14/21 0858  AST 24  ALT 31  ALKPHOS 67  BILITOT 0.4  PROT 7.1  ALBUMIN 3.6   No results for input(s): LIPASE, AMYLASE in the last 168 hours. No results for input(s): AMMONIA in the last 168 hours. Coagulation Profile: No results for input(s): INR, PROTIME in the last 168 hours. Cardiac Enzymes: No results for input(s): CKTOTAL, CKMB, CKMBINDEX, TROPONINI in the last 168 hours. BNP (last 3 results) No results for input(s): PROBNP in the last 8760 hours. HbA1C: No results for input(s): HGBA1C in the last 72 hours. CBG: No results for input(s): GLUCAP in the last 168 hours. Lipid Profile: No results for input(s): CHOL, HDL, LDLCALC, TRIG, CHOLHDL, LDLDIRECT in the last 72 hours. Thyroid Function Tests: No results for input(s): TSH, T4TOTAL, FREET4, T3FREE, THYROIDAB in the last 72 hours. Anemia Panel: No results for input(s): VITAMINB12, FOLATE, FERRITIN, TIBC, IRON, RETICCTPCT in the last 72 hours. Urine analysis:    Component Value Date/Time   COLORURINE YELLOW 01/14/2021 1100   APPEARANCEUR CLEAR 01/14/2021 1100   LABSPEC 1.015 01/14/2021 1100   PHURINE 7.5 01/14/2021 1100   GLUCOSEU NEGATIVE 01/14/2021 1100   HGBUR MODERATE (A) 01/14/2021 1100   BILIRUBINUR NEGATIVE 01/14/2021 1100   KETONESUR NEGATIVE 01/14/2021 1100   PROTEINUR NEGATIVE 01/14/2021  [place] 01/14/2021 [place] 01/14/2021 1100    Radiological Exams on Admission: DG Chest Portable 1 View  Result Date: 01/14/2021 CLINICAL DATA:  75 year old male with history of shortness of breath and chest pain since yesterday evening. EXAM: PORTABLE CHEST 1 VIEW COMPARISON:  Chest x-ray 01/05/2021. FINDINGS: Lung volumes are normal. No consolidative airspace disease. No pleural effusions. No pneumothorax. Small calcified granulomas in the right upper lobe, similar to the prior study. No other pulmonary nodule or mass noted. Pulmonary vasculature and the cardiomediastinal silhouette are within normal limits. Atherosclerosis in the thoracic aorta. IMPRESSION: 1. No radiographic evidence of acute cardiopulmonary disease. 2. Aortic atherosclerosis. Electronically Signed   By: Vinnie Langton M.D.   On: 01/14/2021 10:45    EKG: Independently reviewed. Sinus Tachycardia, PVCs, old anteroseptal injury, no acute changes  Assessment/Plan Active Problems:   COPD exacerbation (HCC)   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HCC)   Anxiety   Paroxysmal atrial fibrillation (HCC)   Essential hypertension     1. Pulmonary - patient with COPD GOLD II. Recent h/o Covid pna and bacterial pna. NOw with persistent SOB. No clear evidence of recurrent pneumonia - mild leukocytosis 2/2 steroids with a nl differential, clear CXR, no fever, no purulent sputum production. Although Covid positive today he is likely still in the positive window from Jan 2 Covid pneumonia. Plan` Will not continue rocephin and azithromycin  Will continue doxycycline started by pulmonary team  O2 sat on RA 95% - will continue nocturnal oxygen as at home.  Continue home respiratory treatment except for nebulizer treatments  Will order inflammatory markers  2. PAF - in sinus tachycardia. Continue home meds, including Eliquis  3. DM-  On no medications at home. Suspect elevation in glucose 2/2 steroids. Plan Sliding scale while  inpatient  May benefit from basal insulin low dose at d/c  4. HTN - continue home meds  5. HLD - continue home meds  6. Anxiety - frequent episodes of anxiety related to his COPD, which makes his breathing symptoms worse Plan zoloft 25 mg daily  Dsiposition - reassess in AM, may be candidate for early discharge.   DVT prophylaxis: Eliquis  Code Status: full code  Family Communication: spoke with Mrs. Balfour. She reports his O2 sat was in the mid-80s at home. She confirms that he does get anxious when short of breath. (Specify name, relationship. Do not write "discussed with patient". Specify tel # if discussed over the phone) Disposition Plan: home when stable1 (specify when and where you expect patient to be discharged) Consults called: none (with names) Admission status: observation (inpatient / obs / tele / medical floor / SDU)   Adella Hare MD [facility] Pager [phone]  If 7PM-7AM, please contact night-coverage [email or web address] Password [facility]  01/14/2021, 7:46 PM

## 2021-01-14 NOTE — ED Triage Notes (Signed)
Pt arrives with son, with c/o shob and CP that started last night. Pt Covid + 12/24/20

## 2021-01-14 NOTE — ED Notes (Signed)
Will update vitals after procedure

## 2021-01-14 NOTE — Progress Notes (Signed)
Weaned patient to room air. Saturation 96% on room air. No signs of respiratory distress. Patient said he wanted to keep cannula in his nose even though the oxygen is turned off.

## 2021-01-14 NOTE — ED Notes (Signed)
EDP, [place], notified of critical lactic acid 2.4

## 2021-01-15 ENCOUNTER — Ambulatory Visit: Payer: Medicare Other | Admitting: Cardiology

## 2021-01-15 ENCOUNTER — Inpatient Hospital Stay: Payer: Medicare Other | Admitting: Family Medicine

## 2021-01-15 DIAGNOSIS — F419 Anxiety disorder, unspecified: Secondary | ICD-10-CM | POA: Diagnosis not present

## 2021-01-15 DIAGNOSIS — U071 COVID-19: Secondary | ICD-10-CM | POA: Diagnosis not present

## 2021-01-15 LAB — CBC WITH DIFFERENTIAL/PLATELET
Abs Immature Granulocytes: 0.07 10*3/uL (ref 0.00–0.07)
Basophils Absolute: 0 10*3/uL (ref 0.0–0.1)
Basophils Relative: 0 %
Eosinophils Absolute: 0 10*3/uL (ref 0.0–0.5)
Eosinophils Relative: 0 %
HCT: 32.2 % — ABNORMAL LOW (ref 39.0–52.0)
Hemoglobin: 10.3 g/dL — ABNORMAL LOW (ref 13.0–17.0)
Immature Granulocytes: 1 %
Lymphocytes Relative: 6 %
Lymphs Abs: 0.5 10*3/uL — ABNORMAL LOW (ref 0.7–4.0)
MCH: 25.6 pg — ABNORMAL LOW (ref 26.0–34.0)
MCHC: 32 g/dL (ref 30.0–36.0)
MCV: 80.1 fL (ref 80.0–100.0)
Monocytes Absolute: 0.3 10*3/uL (ref 0.1–1.0)
Monocytes Relative: 3 %
Neutro Abs: 8.6 10*3/uL — ABNORMAL HIGH (ref 1.7–7.7)
Neutrophils Relative %: 90 %
Platelets: 190 10*3/uL (ref 150–400)
RBC: 4.02 MIL/uL — ABNORMAL LOW (ref 4.22–5.81)
RDW: 16.9 % — ABNORMAL HIGH (ref 11.5–15.5)
WBC: 9.5 10*3/uL (ref 4.0–10.5)
nRBC: 0 % (ref 0.0–0.2)

## 2021-01-15 LAB — LACTATE DEHYDROGENASE: LDH: 294 U/L — ABNORMAL HIGH (ref 98–192)

## 2021-01-15 LAB — LACTIC ACID, PLASMA
Lactic Acid, Venous: 1.9 mmol/L (ref 0.5–1.9)
Lactic Acid, Venous: 4.1 mmol/L (ref 0.5–1.9)

## 2021-01-15 LAB — BASIC METABOLIC PANEL
Anion gap: 15 (ref 5–15)
BUN: 23 mg/dL (ref 8–23)
CO2: 27 mmol/L (ref 22–32)
Calcium: 8.8 mg/dL — ABNORMAL LOW (ref 8.9–10.3)
Chloride: 98 mmol/L (ref 98–111)
Creatinine, Ser: 1.01 mg/dL (ref 0.61–1.24)
GFR, Estimated: >60 mL/min (ref >60)
Glucose, Bld: 193 mg/dL — ABNORMAL HIGH (ref 70–99)
Potassium: 4.3 mmol/L (ref 3.5–5.1)
Sodium: 140 mmol/L (ref 135–145)

## 2021-01-15 LAB — GLUCOSE, CAPILLARY: Glucose-Capillary: 120 mg/dL — ABNORMAL HIGH (ref 70–99)

## 2021-01-15 LAB — C-REACTIVE PROTEIN: CRP: 1 mg/dL — ABNORMAL HIGH (ref <1.0)

## 2021-01-15 LAB — FERRITIN: Ferritin: 317 ng/mL (ref 24–336)

## 2021-01-15 MED ORDER — SERTRALINE HCL 25 MG PO TABS: 25.0000 mg | ORAL_TABLET | Freq: Every day | ORAL | 0 refills | Status: DC

## 2021-01-15 MED ORDER — IPRATROPIUM-ALBUTEROL 20-100 MCG/ACT IN AERS
2.0000 | INHALATION_SPRAY | Freq: Four times a day (QID) | RESPIRATORY_TRACT | Status: DC
  Administered 2021-01-15 (×2): 2 via RESPIRATORY_TRACT

## 2021-01-15 NOTE — Discharge Summary (Signed)
Discharge Summary  Paul [name] [id number] DOB: [birth year]  PCP: [name], MD  Admit date: 01/14/2021 Discharge date: 01/15/2021  Time spent: 35 minutes  Recommendations for Outpatient Follow-up:  1. Follow-up with pulmonary on 01/16/2021 at 9:30 AM.  Please keep your appointment. 2. Follow-up with your PCP in 1 to 2 weeks 3. Take your medications as prescribed 4. Continue PT OT with assistance and fall precautions.  Discharge Diagnoses:  Active Hospital Problems   Diagnosis Date Noted  . Anxiety 01/14/2021  . COPD exacerbation ([place]) 12/09/2020  . Essential hypertension 01/28/2020  . 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[facility]) 01/25/2020  . Paroxysmal atrial fibrillation [facility])     Resolved Hospital Problems  No resolved problems to display.    Discharge Condition: Stable  Diet recommendation: Resume previous diet.  Vitals:   01/14/21 1945 01/15/21 0517  BP: 125/64 (!) 159/73  Pulse: 100 90  Resp: 14 20  Temp: 98.2 F (36.8 C) 98.7 F (37.1 C)  SpO2: 94% 99%    History of present illness:  Paul [name] is a 75 y.o. male with medical history significant of COPD, paroxysmal A fib on Eliquis, type II DM, HTN. He was admitted Jan 2, 22 for covid pna, treated successfully and discharged home.  He was readmitted 1/14-1/18/22 for bacterial pneumonia and treated with antibiotics. He was seen by Dr. Melvyn Novas, pulmonary, outpatient on 01/12/21 for increased SOB/DOE at which time he was placed on increased steroids and abx with Levaquin. The patient had an adverse reaction to Levaquin and was switched to doxycycline for COPD exacerbation.    He presented to the [facility] ED due to persistent shortness of breath and chest tightness and was transferred to [facility] for further evaluation and management.  Chest x-ray done on 01/14/2021 no evidence of acute cardiopulmonary disease.  O2 saturation 99% on 2 L.  Afebrile with no evidence of active infective process.  Was started on  Zoloft by admitting physician on 01/14/2021 due to general anxiety.  01/15/21: Patient was seen and examined at his bedside.  There were no acute events overnight.  He is laying in the bed not on oxygen supplementation, no evidence of acute distress, appears comfortable.  Chest auscultation clear with no wheezing or rales.  No noted work of breathing while at rest.  Called his wife and gave updates.  Informed his wife that he has an appointment scheduled at his pulmonologist office on 01/16/2021 at 9:30 AM.  Advised to keep the appointment.  All questions answered to her satisfaction.  Hospital Course:  Active Problems:   Paroxysmal atrial fibrillation (HCC)   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HCC)   Essential hypertension   COPD exacerbation (HCC)   Anxiety  1. COPD GOLD II. Recent h/o Covid pna and bacterial pna.  Now with dyspnea.  Chest x-ray done on 01/14/2021 no evidence of acute cardiopulmonary disease.   Currently afebrile with no leukocytosis, WBC 9.5K on 01/15/21.   Although Covid positive on 01/14/2021 he is likely still in the positive window from Jan 2 Covid pneumonia. Continue home regimen as recommended by your pulmonologist Dr. Melvyn Novas.   Follow-up with Dr. Melvyn Novas on 01/16/2021 at 9:30 AM.  2. PAF -rate is currently controlled on bisoprolol.  Continue Eliquis for CVA prevention.    3.  Type 2 diabetes, well controlled. Resume home regimen and follow-up with your PCP.  4. HTN - continue home meds  5. HLD - continue home meds  6. Anxiety -  frequent episodes of anxiety related to his COPD, which makes his breathing symptoms worse Continue  zoloft 25 mg daily, started on 01/14/2021. Follow-up with your PCP.  7.  Acute on chronic hypoxia in the setting of COPD exacerbation Currently on treatment for COPD exacerbation, initiated by his pulmonologist, continue. He is on 2 L oxygen supplementation at baseline.  Requiring 3-4L with ambulation. Maintain O2 saturation  greater than 90%. Follow-up with your pulmonologist on 01/16/2021 at 9:30 AM.    Procedures:  None.  Consultations:  None.  Discharge Exam: BP (!) 159/73 (BP Location: Left Arm)   Pulse 90   Temp 98.7 F (37.1 C)   Resp 20   Ht 5\' 6"  (1.676 m)   Wt 67.7 kg   SpO2 99%   BMI 24.10 kg/m  . General: 75 y.o. year-old male well developed well nourished in no acute distress.  Alert and oriented x3. . Cardiovascular: Regular rate and rhythm with no rubs or gallops.  No thyromegaly or JVD noted.   Marland Kitchen Respiratory: Clear to auscultation with no wheezes or rales. Good inspiratory effort. . Abdomen: Soft nontender nondistended with normal bowel sounds x4 quadrants. . Musculoskeletal: No lower extremity edema bilaterally. Marland Kitchen Psychiatry: Mood is appropriate for condition and setting  Discharge Instructions You were cared for by a hospitalist during your hospital stay. If you have any questions about your discharge medications or the care you received while you were in the hospital after you are discharged, you can call the unit and asked to speak with the hospitalist on call if the hospitalist that took care of you is not available. Once you are discharged, your primary care physician will handle any further medical issues. Please note that NO REFILLS for any discharge medications will be authorized once you are discharged, as it is imperative that you return to your primary care physician (or establish a relationship with a primary care physician if you do not have one) for your aftercare needs so that they can reassess your need for medications and monitor your lab values.   Allergies as of 01/15/2021      Reactions   Daliresp [roflumilast] Diarrhea, Nausea And Vomiting   Alendronate Anxiety, Other (See Comments)   Jittery/nervous   Levaquin [levofloxacin] Palpitations, Other (See Comments)   Made the B/P fluctuate and heart raced      Medication List    STOP taking these medications    levofloxacin 500 MG tablet Commonly known as: LEVAQUIN   omeprazole 20 MG capsule Commonly known as: PRILOSEC     TAKE these medications   acetaminophen 325 MG tablet Commonly known as: TYLENOL Take 2 tablets (650 mg total) by mouth every 6 (six) hours as needed for mild pain or headache (fever >/= 101).   apixaban 5 MG Tabs tablet Commonly known as: ELIQUIS Take 1 tablet (5 mg total) by mouth 2 (two) times daily.   ascorbic acid 500 MG tablet Commonly known as: VITAMIN C Take 1 tablet (500 mg total) by mouth daily. What changed: how much to take   atorvastatin 40 MG tablet Commonly known as: LIPITOR TAKE 1 TABLET(40 MG) BY MOUTH DAILY AFTER SUPPER What changed:   how much to take  how to take this  when to take this  additional instructions   benzonatate 100 MG capsule Commonly known as: TESSALON Take 100-200 mg by mouth 3 (three) times daily as needed for cough.   bisoprolol 5 MG tablet Commonly known as: ZEBETA Take 1  tablet (5 mg total) by mouth daily.   budesonide-formoterol 160-4.5 MCG/ACT inhaler Commonly known as: SYMBICORT Inhale 2 puffs into the lungs 2 (two) times daily.   calcium carbonate 600 MG Tabs tablet Commonly known as: OS-CAL Take 600 mg by mouth daily.   denosumab 60 MG/ML Sosy injection Commonly known as: PROLIA Inject 60 mg into the skin every 6 (six) months.   diltiazem 120 MG 24 hr capsule Commonly known as: CARDIZEM CD Take 1 capsule (120 mg total) by mouth daily.   doxycycline 100 MG tablet Commonly known as: VIBRA-TABS Take 1 tablet (100 mg total) by mouth 2 (two) times daily.   furosemide 40 MG tablet Commonly known as: LASIX Take 1 tablet (40 mg total) by mouth 2 (two) times daily.   guaiFENesin 600 MG 12 hr tablet Commonly known as: MUCINEX Take 1,200 mg by mouth 2 (two) times daily.   loratadine 10 MG tablet Commonly known as: CLARITIN Take 10 mg by mouth in the morning.   melatonin 5 MG Tabs Take 5 mg by  mouth at bedtime.   multivitamin with minerals Tabs tablet Take 1 tablet by mouth daily.   niacinamide 500 MG tablet Take 500 mg by mouth daily.   omeprazole 20 MG tablet Commonly known as: PRILOSEC OTC Take 40 mg by mouth 2 (two) times daily before a meal.   OXYGEN Inhale 2 L/min into the lungs See admin instructions. Inhale 2 L/min of oxygen into the lungs at bedtime and when ambulatory   predniSONE 10 MG tablet Commonly known as: DELTASONE Take 4 tablets (40 mg total) by mouth daily with breakfast for 2 days, THEN 4 tablets (40 mg total) daily with breakfast for 3 days, THEN 3.5 tablets (35 mg total) daily with breakfast for 3 days, THEN 3.5 tablets (35 mg total) daily with breakfast for 3 days, THEN 3 tablets (30 mg total) daily with breakfast for 3 days, THEN 3 tablets (30 mg total) daily with breakfast for 3 days, THEN 2.5 tablets (25 mg total) daily with breakfast for 3 days, THEN 2.5 tablets (25 mg total) daily with breakfast for 3 days, THEN 2 tablets (20 mg total) daily with breakfast for 3 days. Start taking on: January 09, 2021   predniSONE 5 MG tablet Commonly known as: DELTASONE Take 20-40 mg by mouth See admin instructions. Take 40 mg by mouth with food daily for three days and decrease by 2.5 mg every three days until at a dose of 20 mg daily. Remain at 20 mg daily until follow-up appt with pulmonologist.   ProAir HFA 108 (90 Base) MCG/ACT inhaler Generic drug: albuterol Inhale 2 puffs into the lungs every 6 (six) hours as needed for wheezing or shortness of breath. What changed: Another medication with the same name was changed. Make sure you understand how and when to take each.   albuterol (2.5 MG/3ML) 0.083% nebulizer solution Commonly known as: PROVENTIL Take 3 mLs (2.5 mg total) by nebulization in the morning and at bedtime. What changed: when to take this   sertraline 25 MG tablet Commonly known as: ZOLOFT Take 1 tablet (25 mg total) by mouth at bedtime.    sodium chloride 0.65 % nasal spray Commonly known as: OCEAN Place 1 spray into the nose as needed for congestion.   Spiriva Respimat 2.5 MCG/ACT Aers Generic drug: Tiotropium Bromide Monohydrate Inhale 2 puffs into the lungs daily.   Vitamin D 50 MCG (2000 UT) Caps Take 2,000 Units by mouth daily.  Durable Medical Equipment  (From admission, onward)         Start     Ordered   01/15/21 1021  For home use only DME oxygen  Once       Question Answer Comment  Length of Need 6 Months   Mode or (Route) Nasal cannula   Liters per Minute 4   Frequency Continuous (stationary and portable oxygen unit needed)   Oxygen conserving device Yes   Oxygen delivery system Gas      01/15/21 1021         Allergies  Allergen Reactions  . Daliresp [Roflumilast] Diarrhea and Nausea And Vomiting  . Alendronate Anxiety and Other (See Comments)    Jittery/nervous  . Levaquin [Levofloxacin] Palpitations and Other (See Comments)    Made the B/P fluctuate and heart raced    Follow-up Information    Cox, Kirsten, MD. Call in 1 day(s).   Specialties: Family Medicine, Interventional Cardiology, Radiology, Anesthesiology Why: Please call for a post hospital follow-up appointment. Contact information: [address]  [place] [address] [phone]        Jerline Pain, MD .   Specialty: Cardiology Contact information: [phone] [address] [place] [address] [phone]        Tanda Rockers, MD. Call in 1 day(s).   Specialty: Pulmonary Disease Why: Please keep your appointment on 01/16/2021 at 9:30 AM. Contact information: [place] [address] [phone]                The results of significant diagnostics from this hospitalization (including imaging, microbiology, ancillary and laboratory) are listed below for reference.    Significant Diagnostic Studies: DG Chest 2 View  Result Date:  01/05/2021 CLINICAL DATA:  Syncope and fall today.  Shortness of breath. EXAM: CHEST - 2 VIEW COMPARISON:  Single-view of the chest 12/24/2020 and 12/07/2020. CT chest 03/27/2020. FINDINGS: The lungs are emphysematous. There is a hazy focus of airspace opacity in the lingula which is new since the prior exams. The right lung is clear. No pneumothorax or pleural effusion. Heart size is normal. Aortic atherosclerosis. No acute or focal bony abnormality. IMPRESSION: Hazy airspace opacity in lingula worrisome for pneumonia. Aortic Atherosclerosis (ICD10-I70.0) and Emphysema (ICD10-J43.9). Electronically Signed   By: Inge Rise M.D.   On: 01/05/2021 13:44   CT Head Wo Contrast  Result Date: 01/05/2021 CLINICAL DATA:  Syncope with fall. EXAM: CT HEAD WITHOUT CONTRAST CT CERVICAL SPINE WITHOUT CONTRAST TECHNIQUE: Multidetector CT imaging of the head and cervical spine was performed following the standard protocol without intravenous contrast. Multiplanar CT image reconstructions of the cervical spine were also generated. COMPARISON:  Head CT 03/27/2020. FINDINGS: CT HEAD FINDINGS Brain: There is no evidence for acute hemorrhage, hydrocephalus, mass lesion, or abnormal extra-axial fluid collection. No definite CT evidence for acute infarction. Diffuse loss of parenchymal volume is consistent with atrophy. Patchy low attenuation in the deep hemispheric and periventricular white matter is nonspecific, but likely reflects chronic microvascular ischemic demyelination. Vascular: No hyperdense vessel or unexpected calcification. Skull: No evidence for fracture. No worrisome lytic or sclerotic lesion. Sinuses/Orbits: The visualized paranasal sinuses and mastoid air cells are clear. Visualized portions of the globes and intraorbital fat are unremarkable. Other: Soft tissue contusion noted left parietal scalp. CT CERVICAL SPINE FINDINGS Alignment: Straightening of normal cervical lordosis. No subluxation. Skull base and  vertebrae: No acute fracture. No primary bone lesion or focal pathologic  process. Soft tissues and spinal canal: No prevertebral fluid or swelling. No visible canal hematoma. Disc levels: Loss of disc height noted C2-3 and C5-6. Diffuse facet degeneration noted bilaterally. Upper chest: Centrilobular emphysema with calcific pleuroparenchymal scarring in both apices. Other: None. IMPRESSION: 1. No acute intracranial abnormality. Atrophy with chronic small vessel white matter ischemic disease. 2. Soft tissue contusion left parietal scalp. 3. Degenerative changes in the cervical spine without fracture. 4. Loss of cervical lordosis. This can be related to patient positioning, muscle spasm or soft tissue injury. Electronically Signed   By: Misty Stanley M.D.   On: 01/05/2021 13:44   CT Cervical Spine Wo Contrast  Result Date: 01/05/2021 CLINICAL DATA:  Syncope with fall. EXAM: CT HEAD WITHOUT CONTRAST CT CERVICAL SPINE WITHOUT CONTRAST TECHNIQUE: Multidetector CT imaging of the head and cervical spine was performed following the standard protocol without intravenous contrast. Multiplanar CT image reconstructions of the cervical spine were also generated. COMPARISON:  Head CT 03/27/2020. FINDINGS: CT HEAD FINDINGS Brain: There is no evidence for acute hemorrhage, hydrocephalus, mass lesion, or abnormal extra-axial fluid collection. No definite CT evidence for acute infarction. Diffuse loss of parenchymal volume is consistent with atrophy. Patchy low attenuation in the deep hemispheric and periventricular white matter is nonspecific, but likely reflects chronic microvascular ischemic demyelination. Vascular: No hyperdense vessel or unexpected calcification. Skull: No evidence for fracture. No worrisome lytic or sclerotic lesion. Sinuses/Orbits: The visualized paranasal sinuses and mastoid air cells are clear. Visualized portions of the globes and intraorbital fat are unremarkable. Other: Soft tissue contusion noted  left parietal scalp. CT CERVICAL SPINE FINDINGS Alignment: Straightening of normal cervical lordosis. No subluxation. Skull base and vertebrae: No acute fracture. No primary bone lesion or focal pathologic process. Soft tissues and spinal canal: No prevertebral fluid or swelling. No visible canal hematoma. Disc levels: Loss of disc height noted C2-3 and C5-6. Diffuse facet degeneration noted bilaterally. Upper chest: Centrilobular emphysema with calcific pleuroparenchymal scarring in both apices. Other: None. IMPRESSION: 1. No acute intracranial abnormality. Atrophy with chronic small vessel white matter ischemic disease. 2. Soft tissue contusion left parietal scalp. 3. Degenerative changes in the cervical spine without fracture. 4. Loss of cervical lordosis. This can be related to patient positioning, muscle spasm or soft tissue injury. Electronically Signed   By: Misty Stanley M.D.   On: 01/05/2021 13:44   DG Chest Portable 1 View  Result Date: 01/14/2021 CLINICAL DATA:  75 year old male with history of shortness of breath and chest pain since yesterday evening. EXAM: PORTABLE CHEST 1 VIEW COMPARISON:  Chest x-ray 01/05/2021. FINDINGS: Lung volumes are normal. No consolidative airspace disease. No pleural effusions. No pneumothorax. Small calcified granulomas in the right upper lobe, similar to the prior study. No other pulmonary nodule or mass noted. Pulmonary vasculature and the cardiomediastinal silhouette are within normal limits. Atherosclerosis in the thoracic aorta. IMPRESSION: 1. No radiographic evidence of acute cardiopulmonary disease. 2. Aortic atherosclerosis. Electronically Signed   By: Vinnie Langton M.D.   On: 01/14/2021 10:45   DG Chest Port 1 View  Result Date: 12/24/2020 CLINICAL DATA:  Chest pain, cough and fever. EXAM: PORTABLE CHEST 1 VIEW COMPARISON:  12/07/2020 FINDINGS: The cardiac silhouette, mediastinal and hilar contours are within normal limits and stable. There is mild  tortuosity and calcification the thoracic aorta. Stable underlying emphysematous changes and hyperinflation. No acute overlying pulmonary process. No pleural effusions or pulmonary lesions. The bony thorax is intact. IMPRESSION: Emphysematous changes but no acute overlying pulmonary process. Electronically  Signed   By: Marijo Sanes M.D.   On: 12/24/2020 10:56    Microbiology: Recent Results (from the past 240 hour(s))  Culture, blood (routine x 2) Call MD if unable to obtain prior to antibiotics being given     Status: None   Collection Time: 01/05/21 10:35 PM   Specimen: Left Antecubital; Blood  Result Value Ref Range Status   Specimen Description   Final    LEFT ANTECUBITAL Performed at [facility] [address], [place], [place] [address]    Special Requests   Final    Blood Culture results may not be optimal due to an excessive volume of blood received in culture bottles BOTTLES DRAWN AEROBIC AND ANAEROBIC   Culture   Final    NO GROWTH 5 DAYS Performed at [facility], [place] [address] [place], [place] [address]    Report Status 01/11/2021 FINAL  Final  Culture, blood (routine x 2) Call MD if unable to obtain prior to antibiotics being given     Status: None   Collection Time: 01/05/21 10:35 PM   Specimen: Left Antecubital; Blood  Result Value Ref Range Status   Specimen Description   Final    LEFT ANTECUBITAL Performed at [facility] [address] [place], [place] [address]    Special Requests   Final    BOTTLES DRAWN AEROBIC AND ANAEROBIC Blood Culture results may not be optimal due to an excessive volume of blood received in culture bottles Performed at [facility] [address] [place], [place] [address]    Culture   Final    NO GROWTH 5 DAYS Performed at [facility], [place] [address] [place], [place] [address]    Report Status 01/11/2021 FINAL  Final  MRSA PCR Screening     Status: None    Collection Time: 01/06/21  6:00 AM   Specimen: Nasal Mucosa; Nasopharyngeal  Result Value Ref Range Status   MRSA by PCR NEGATIVE NEGATIVE Final    Comment:        The GeneXpert MRSA Assay (FDA approved for NASAL specimens only), is one component of a comprehensive MRSA colonization surveillance program. It is not intended to diagnose MRSA infection nor to guide or monitor treatment for MRSA infections. Performed at [facility], [place] [address], [place], [place] [address]   SARS Coronavirus 2 by RT PCR (hospital order, performed in [facility] hospital lab) Nasopharyngeal Nasopharyngeal Swab     Status: Abnormal   Collection Time: 01/14/21 12:29 PM   Specimen: Nasopharyngeal Swab  Result Value Ref Range Status   SARS Coronavirus 2 POSITIVE (A) NEGATIVE Final    Comment: RESULT CALLED TO, READ BACK BY AND VERIFIED WITH: Josph Macho RN [id number] PHILLIPS C (NOTE) SARS-CoV-2 target nucleic acids are DETECTED  SARS-CoV-2 RNA is generally detectable in upper respiratory specimens  during the acute phase of infection.  Positive results are indicative  of the presence of the identified virus, but do not rule out bacterial infection or co-infection with other pathogens not detected by the test.  Clinical correlation with patient history and  other diagnostic information is necessary to determine patient infection status.  The expected result is negative.  Fact Sheet for Patients:   StrictlyIdeas.no   Fact Sheet for Healthcare Providers:   BankingDealers.co.za    This test is not yet approved or cleared by the [place] FDA and  has been authorized for  detection and/or diagnosis of SARS-CoV-2 by FDA under an Emergency Use Authorization (EUA).  This EUA will remain in effect (meaning this tes t can be used) for the duration of  the COVID-19 declaration under Section 564(b)(1) of the Act, 21 U.S.C. section  360-bbb-3(b)(1), unless the authorization is terminated or revoked sooner.  Performed at [facility], [address] [place], [place] [address]      Labs: Basic Metabolic Panel: Recent Labs  Lab 01/14/21 0858 01/15/21 0423  NA 141 140  K 3.4* 4.3  CL 96* 98  CO2 33* 27  GLUCOSE 197* 193*  BUN 21 23  CREATININE 0.78 1.01  CALCIUM 9.1 8.8*   Liver Function Tests: Recent Labs  Lab 01/14/21 0858  AST 24  ALT 31  ALKPHOS 67  BILITOT 0.4  PROT 7.1  ALBUMIN 3.6   No results for input(s): LIPASE, AMYLASE in the last 168 hours. No results for input(s): AMMONIA in the last 168 hours. CBC: Recent Labs  Lab 01/14/21 0858 01/15/21 0423  WBC 16.4* 9.5  NEUTROABS 10.6* 8.6*  HGB 11.7* 10.3*  HCT 38.6* 32.2*  MCV 82.5 80.1  PLT 313 190   Cardiac Enzymes: No results for input(s): CKTOTAL, CKMB, CKMBINDEX, TROPONINI in the last 168 hours. BNP: BNP (last 3 results) Recent Labs    02/16/20 1726 03/04/20 1220 01/14/21 0858  BNP 88.9 82.1 68.6    ProBNP (last 3 results) No results for input(s): PROBNP in the last 8760 hours.  CBG: Recent Labs  Lab 01/15/21 0630  GLUCAP 120*       Signed:  Kayleen Memos, MD [facility] 01/15/2021, 11:14 AM

## 2021-01-15 NOTE — Care Management Obs Status (Signed)
[facility] NOTIFICATION   Patient Details  Name: Paul [name] MRN: [id number] Date of Birth: 09/21/1946   Medicare Observation Status Notification Given:  Yes    Angelita Ingles, RN 01/15/2021, 10:04 AM

## 2021-01-15 NOTE — TOC Progression Note (Signed)
Transition of Care [facility]) - Progression Note    Patient Details  Name: Paul [name] MRN: [id number] Date of Birth: [birth year]  Transition of Care [facility]) CM/SW [place], RN Phone Number: [phone]  01/15/2021, 10:33 AM  Clinical Narrative:    Patient has orders for [facility]. Cm called patient to attempt to set up [facility] and patient states that he already has a nurse that comes every 2 weeks and he does not want therapy because it doesn't work. Patient is not agreeable to any Home Health services. MD has been made aware.    Expected Discharge Plan: Home/Self Care Barriers to Discharge: No Barriers Identified  Expected Discharge Plan and Services Expected Discharge Plan: Home/Self Care In-house Referral: NA Discharge Planning Services: CM Consult   Living arrangements for the past 2 months: Single Family Home Expected Discharge Date: 01/15/21               DME Arranged: N/A (Currently has DME through [facility])         [place] Arranged: NA HH Agency: NA         Social Determinants of Health (SDOH) Interventions    Readmission Risk Interventions Readmission Risk Prevention Plan 01/15/2021 04/05/2020 06/01/2019  Transportation Screening Complete Complete Complete  PCP or Specialist Appt within 3-5 Days - - Not Complete  Not Complete comments - - not ready for discharge   [place] or [place] - - Complete  Social Work Consult for [place] Planning/Counseling - - Complete  Palliative Care Screening - - Not Applicable  Medication Review Press photographer) Complete Complete Complete  PCP or Specialist appointment within 3-5 days of discharge Complete Complete -  [place] or Home Care Consult Complete Complete -  SW Recovery Care/Counseling Consult Complete Complete -  Palliative Care Screening Not Applicable Complete -  [place] Not Applicable Not Applicable -  Some recent data might be hidden

## 2021-01-15 NOTE — Progress Notes (Signed)
PT Cancellation Note  Patient Details Name: Paul [name] MRN: [id number] DOB: 06/12/1946   Cancelled Treatment:    Reason Eval/Treat Not Completed: Other (comment).  PT orders cx.  I spoke to RN, Caryl Pina, who has already done ambulatory sat assessment.  Pt already had home O2, and he is refusing offers from RN CM for HHPT follow up.  PT to sign off at this time.   Thanks,  Verdene Lennert, PT, DPT  Acute Rehabilitation [phone] pager #[phone] office       Wells Guiles B Johnanna Bakke 01/15/2021, 11:09 AM

## 2021-01-15 NOTE — TOC Initial Note (Signed)
Transition of Care [facility]) - Initial/Assessment Note    Patient Details  Name: Desmond Szabo MRN: [id number] Date of Birth: 05-02-1946  Transition of Care [facility]) CM/SW Contact:    Angelita Ingles, RN Phone Number: [phone]  01/15/2021, 9:39 AM  Clinical Narrative:                 [facility] consulted for Home O2. CM spoke with patient who states that he currently has home O2 through [facility]. Patient verified that he does have a portable tank for discharge. Patient denies any other needs. No further needs noted at this time. TOC will sign off.   Expected Discharge Plan: Home/Self Care Barriers to Discharge: No Barriers Identified   Patient Goals and CMS Choice Patient states their goals for this hospitalization and ongoing recovery are:: Patient states that he is ready to go. CMS Medicare.gov Compare Post Acute Care list provided to:: Patient Choice offered to / list presented to : Patient  Expected Discharge Plan and Services Expected Discharge Plan: Home/Self Care In-house Referral: NA Discharge Planning Services: CM Consult   Living arrangements for the past 2 months: Single Family Home Expected Discharge Date: 01/15/21               DME Arranged: N/A (Currently has DME through [facility])         [place] Arranged: NA HH Agency: NA        Prior Living Arrangements/Services Living arrangements for the past 2 months: [place] with:: Self Patient language and need for interpreter reviewed:: Yes Do you feel safe going back to the place where you live?: Yes      Need for Family Participation in Patient Care: Yes (Comment) Care giver support system in place?: Yes (comment) Current home services: Other (comment) (palliative outpatient ( [facility])) Criminal Activity/Legal Involvement Pertinent to Current Situation/Hospitalization: No - Comment as needed  Activities of Daily Living Home Assistive Devices/Equipment: Oxygen,Dentures (specify type) ADL Screening  (condition at time of admission) Patient's cognitive ability adequate to safely complete daily activities?: Yes Is the patient deaf or have difficulty hearing?: No Does the patient have difficulty seeing, even when wearing glasses/contacts?: No Does the patient have difficulty concentrating, remembering, or making decisions?: No Patient able to express need for assistance with ADLs?: Yes Does the patient have difficulty dressing or bathing?: No Independently performs ADLs?: Yes (appropriate for developmental age) Does the patient have difficulty walking or climbing stairs?: No Weakness of Legs: None Weakness of Arms/Hands: None  Permission Sought/Granted   Permission granted to share information with : No              Emotional Assessment Appearance:: Other (Comment Required (unable to assess covid patient) Attitude/Demeanor/Rapport: Unable to Assess Affect (typically observed): Unable to Assess Orientation: : Oriented to Self,Oriented to Place,Oriented to  Time,Oriented to Situation Alcohol / Substance Use: Not Applicable Psych Involvement: No (comment)  Admission diagnosis:  COPD exacerbation ([place]) [J44.1] Patient Active Problem List   Diagnosis Date Noted  . Anxiety 01/14/2021  . Lingular pneumonia 01/05/2021  . COVID-19 12/24/2020  . COPD exacerbation ([place]) 12/09/2020  . COPD with acute exacerbation ([place]) 12/08/2020  . Syncope 04/01/2020  . Chronic diastolic CHF (congestive heart failure) ([place]) 04/01/2020  . Osteoporosis with current pathological fracture 02/06/2020  . Essential hypertension 01/28/2020  . Chronic bilateral thoracic back pain 01/25/2020  . 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[place]) 01/25/2020  . HLD (hyperlipidemia) 01/05/2020  . Vertebral compression  fracture ([place]) 05/29/2019  . Chronic anemia 05/29/2019  . Pulmonary nodules 05/29/2019  . Coronary artery calcification 04/06/2019  . Atherosclerosis of aorta ([place]) 04/06/2019  . Paroxysmal  atrial fibrillation (HCC)   . Acute on chronic respiratory failure with hypoxia ([place])   . Lung nodule < 6cm on CT 05/29/2016  . Former cigarette smoker 12/05/2015  . COPD GOLD  III     PCP:  [name], MD Pharmacy:   [facility] [address] [place] [address] Phone: [phone] Fax: [phone]     Social Determinants of Health (SDOH) Interventions    Readmission Risk Interventions Readmission Risk Prevention Plan 01/15/2021 04/05/2020 06/01/2019  Transportation Screening Complete Complete Complete  PCP or Specialist Appt within 3-5 Days - - Not Complete  Not Complete comments - - not ready for discharge   [place] or [place] - - Complete  Social Work Consult for [place] Planning/Counseling - - Complete  Palliative Care Screening - - Not Applicable  Medication Review Press photographer) Complete Complete Complete  PCP or Specialist appointment within 3-5 days of discharge Complete Complete -  [place] or Home Care Consult Complete Complete -  SW Recovery Care/Counseling Consult Complete Complete -  Palliative Care Screening Not Applicable Complete -  [place] Not Applicable Not Applicable -  Some recent data might be hidden

## 2021-01-15 NOTE — Plan of Care (Signed)

## 2021-01-15 NOTE — Discharge Instructions (Signed)
Chronic Obstructive Pulmonary Disease  Chronic obstructive pulmonary disease (COPD) is a long-term (chronic) lung problem. When you have COPD, it is hard for air to get in and out of your lungs. Usually the condition gets worse over time, and your lungs will never return to normal. There are things you can do to keep yourself as healthy as possible. What are the causes?  Smoking. This is the most common cause.  Certain genes passed from parent to child (inherited). What increases the risk?  Being exposed to secondhand smoke from cigarettes, pipes, or cigars.  Being exposed to chemicals and other irritants, such as fumes and dust in the work environment.  Having chronic lung conditions or infections. What are the signs or symptoms?  Shortness of breath, especially during physical activity.  A long-term cough with a large amount of thick mucus. Sometimes, the cough may not have any mucus (dry cough).  Wheezing.  Breathing quickly.  Skin that looks gray or blue, especially in the fingers, toes, or lips.  Feeling tired (fatigue).  Weight loss.  Chest tightness.  Having infections often.  Episodes when breathing symptoms become much worse (exacerbations). At the later stages of this disease, you may have swelling in the ankles, feet, or legs. How is this treated?  Taking medicines.  Quitting smoking, if you smoke.  Rehabilitation. This includes steps to make your body work better. It may involve a team of specialists.  Doing exercises.  Making changes to your diet.  Using oxygen.  Lung surgery.  Lung transplant.  Comfort measures (palliative care). Follow these instructions at home: Medicines  Take over-the-counter and prescription medicines only as told by your doctor.  Talk to your doctor before taking any cough or allergy medicines. You may need to avoid medicines that cause your lungs to be dry. Lifestyle  If you smoke, stop smoking. Smoking makes the  problem worse.  Do not smoke or use any products that contain nicotine or tobacco. If you need help quitting, ask your doctor.  Avoid being around things that make your breathing worse. This may include smoke, chemicals, and fumes.  Stay active, but remember to rest as well.  Learn and use tips on how to manage stress and control your breathing.  Make sure you get enough sleep. Most adults need at least 7 hours of sleep every night.  Eat healthy foods. Eat smaller meals more often. Rest before meals. Controlled breathing Learn and use tips on how to control your breathing as told by your doctor. Try:  Breathing in (inhaling) through your nose for 1 second. Then, pucker your lips and breath out (exhale) through your lips for 2 seconds.  Putting one hand on your belly (abdomen). Breathe in slowly through your nose for 1 second. Your hand on your belly should move out. Pucker your lips and breathe out slowly through your lips. Your hand on your belly should move in as you breathe out.   Controlled coughing Learn and use controlled coughing to clear mucus from your lungs. Follow these steps: 1. Lean your head a little forward. 2. Breathe in deeply. 3. Try to hold your breath for 3 seconds. 4. Keep your mouth slightly open while coughing 2 times. 5. Spit any mucus out into a tissue. 6. Rest and do the steps again 1 or 2 times as needed. General instructions  Make sure you get all the shots (vaccines) that your doctor recommends. Ask your doctor about a flu shot and a pneumonia shot.    Use oxygen therapy and pulmonary rehabilitation if told by your doctor. If you need home oxygen therapy, ask your doctor if you should buy a tool to measure your oxygen level (oximeter).  Make a COPD action plan with your doctor. This helps you to know what to do if you feel worse than usual.  Manage any other conditions you have as told by your doctor.  Avoid going outside when it is very hot, cold, or  humid.  Avoid people who have a sickness you can catch (contagious).  Keep all follow-up visits. Contact a doctor if:  You cough up more mucus than usual.  There is a change in the color or thickness of the mucus.  It is harder to breathe than usual.  Your breathing is faster than usual.  You have trouble sleeping.  You need to use your medicines more often than usual.  You have trouble doing your normal activities such as getting dressed or walking around the house. Get help right away if:  You have shortness of breath while resting.  You have shortness of breath that stops you from: ? Being able to talk. ? Doing normal activities.  Your chest hurts for longer than 5 minutes.  Your skin color is more blue than usual.  Your pulse oximeter shows that you have low oxygen for longer than 5 minutes.  You have a fever.  You feel too tired to breathe normally. These symptoms may represent a serious problem that is an emergency. Do not wait to see if the symptoms will go away. Get medical help right away. Call your local emergency services (911 in the [place] Do not drive yourself to the hospital. Summary  Chronic obstructive pulmonary disease (COPD) is a long-term lung problem.  The way your lungs work will never return to normal. Usually the condition gets worse over time. There are things you can do to keep yourself as healthy as possible.  Take over-the-counter and prescription medicines only as told by your doctor.  If you smoke, stop. Smoking makes the problem worse. This information is not intended to replace advice given to you by your health care provider. Make sure you discuss any questions you have with your health care provider. Document Revised: 10/17/2020 Document Reviewed: 10/17/2020 Elsevier Patient Education  2021 Elsevier Inc.   

## 2021-01-15 NOTE — Progress Notes (Signed)
   Pt is [facility] with [facility].  Will plan to resume care of pt when he returns home.  Please contact [facility] if we can be of assistance with d/c planning.  Thank you. Wynetta Fines, RN [phone]

## 2021-01-15 NOTE — Care Management CC44 (Signed)
Condition Code 44 Documentation Completed  Patient Details  Name: Paul [name] MRN: [id number] Date of Birth: [birth year]   Condition Code 44 given:  Yes Patient signature on Condition Code 44 notice:  Yes Documentation of 2 MD's agreement:  Yes Code 44 added to claim:  Yes    Angelita Ingles, RN 01/15/2021, 10:04 AM

## 2021-01-15 NOTE — Progress Notes (Signed)
SATURATION QUALIFICATIONS: (This note is used to comply with regulatory documentation for home oxygen)  Patient Saturations on Room Air at Rest = 90%  Patient Saturations on Room Air while Ambulating = 81%  Patient Saturations on 4 Liters of oxygen while Ambulating = 90%  Please briefly explain why patient needs home oxygen: patient becomes SOB and requires at least 4L [place] to maintain O2 sats at or above 90%.

## 2021-01-16 ENCOUNTER — Encounter: Payer: Self-pay | Admitting: Adult Health

## 2021-01-16 ENCOUNTER — Other Ambulatory Visit: Payer: Self-pay

## 2021-01-16 ENCOUNTER — Ambulatory Visit (INDEPENDENT_AMBULATORY_CARE_PROVIDER_SITE_OTHER): Payer: Medicare Other | Admitting: Adult Health

## 2021-01-16 DIAGNOSIS — R55 Syncope and collapse: Secondary | ICD-10-CM

## 2021-01-16 DIAGNOSIS — J9611 Chronic respiratory failure with hypoxia: Secondary | ICD-10-CM | POA: Diagnosis not present

## 2021-01-16 DIAGNOSIS — J449 Chronic obstructive pulmonary disease, unspecified: Secondary | ICD-10-CM

## 2021-01-16 DIAGNOSIS — U071 COVID-19: Secondary | ICD-10-CM | POA: Diagnosis not present

## 2021-01-16 DIAGNOSIS — J189 Pneumonia, unspecified organism: Secondary | ICD-10-CM | POA: Diagnosis not present

## 2021-01-16 DIAGNOSIS — R5381 Other malaise: Secondary | ICD-10-CM | POA: Diagnosis not present

## 2021-01-16 NOTE — Assessment & Plan Note (Signed)
Continue activity as tolerated.  Consider pulmonary rehab on return visit  Plan  Patient Instructions  Finish Doxycycline as directed.  Decrease Prednisone dose by 2.5mg  every 3 days until at  Prednisone 20mg  daily -hold at this dose.  Continue on Symbicort 2 puffs Twice daily   Continue on Spiriva daily  Mucinex Twice daily  As needed  Cough/congestion  Flutter valve Three times a day  .  Albuterol Neb Twice daily  -use the precautions we discussed . Wear Oxygen 2l/m at rest and 3l/m with activity to maintain O2 sats >88-90%.  Activity as tolerated.  Please contact office for sooner follow up if symptoms do not improve or worsen or seek emergency care  Follow up with Dr. Melvyn Novas  Or Dervin Vore NP in 2  weeks  and As needed

## 2021-01-16 NOTE — Assessment & Plan Note (Signed)
Severe COPD with high symptom burden-recent slow to resolve exacerbation post COVID-19 infection.  Patient has been hospitalized 3 times with ongoing lingering symptoms.  Patient did have a lingular pneumonia that has cleared status post antibiotics.  Unfortunately patient has had some medication side effects. Now seems to be turning the corner.  Patient is encouraged on activity as tolerated as he has severe deconditioning.  We will slowly taper down prednisone and hold at 20 mg.  He is to finish up his course of doxycycline.  Chest x-ray shows clearance of his pneumonia . Continue on aggressive pulmonary hygiene regimen On return visit consider referral to pulmonary rehab  Plan  Patient Instructions  Finish Doxycycline as directed.  Decrease Prednisone dose by 2.5mg  every 3 days until at  Prednisone 20mg  daily -hold at this dose.  Continue on Symbicort 2 puffs Twice daily   Continue on Spiriva daily  Mucinex Twice daily  As needed  Cough/congestion  Flutter valve Three times a day  .  Albuterol Neb Twice daily  -use the precautions we discussed . Wear Oxygen 2l/m at rest and 3l/m with activity to maintain O2 sats >88-90%.  Activity as tolerated.  Please contact office for sooner follow up if symptoms do not improve or worsen or seek emergency care  Follow up with Dr. Melvyn Novas  Or Cheria Sadiq NP in 2  weeks  and As needed

## 2021-01-16 NOTE — Patient Instructions (Addendum)
Finish Doxycycline as directed.  Decrease Prednisone dose by 2.5mg  every 3 days until at  Prednisone 20mg  daily -hold at this dose.  Continue on Symbicort 2 puffs Twice daily   Continue on Spiriva daily  Mucinex Twice daily  As needed  Cough/congestion  Flutter valve Three times a day  .  Albuterol Neb Twice daily  -use the precautions we discussed . Wear Oxygen 2l/m at rest and 3l/m with activity to maintain O2 sats >88-90%.  Activity as tolerated.  Please contact office for sooner follow up if symptoms do not improve or worsen or seek emergency care  Follow up with Dr. Melvyn Novas  Or [place] NP in 2  weeks  and As needed

## 2021-01-16 NOTE — Assessment & Plan Note (Signed)
No further episodes.  Patient is to follow-up with cardiology as discussed.  Most recent episode of syncope with hospitalization most likely secondary to associated recent COVID-19 infection with ongoing illness associated orthostasis and pneumonia.  Seems to be improved.  Patient is encouraged on changing positions slowly adequate hydration  Plan  Patient Instructions  Finish Doxycycline as directed.  Decrease Prednisone dose by 2.5mg  every 3 days until at  Prednisone 20mg  daily -hold at this dose.  Continue on Symbicort 2 puffs Twice daily   Continue on Spiriva daily  Mucinex Twice daily  As needed  Cough/congestion  Flutter valve Three times a day  .  Albuterol Neb Twice daily  -use the precautions we discussed . Wear Oxygen 2l/m at rest and 3l/m with activity to maintain O2 sats >88-90%.  Activity as tolerated.  Please contact office for sooner follow up if symptoms do not improve or worsen or seek emergency care  Follow up with Dr. Melvyn Novas  Or Kendyn Zaman NP in 2  weeks  and As needed

## 2021-01-16 NOTE — Assessment & Plan Note (Signed)
Patient is making slow clinical progress.  He has had multiple complications and ongoing slow to resolve COPD exacerbation. Continue current regimen and supportive care. During recent hospitalization LDH and lactate levels remain high. We will reevaluate on return and consider repeat labs if indicated

## 2021-01-16 NOTE — Assessment & Plan Note (Signed)
Resolved on chest x-ray.  Finish up antibiotic course currently.  Plan  Patient Instructions  Finish Doxycycline as directed.  Decrease Prednisone dose by 2.5mg  every 3 days until at  Prednisone 20mg  daily -hold at this dose.  Continue on Symbicort 2 puffs Twice daily   Continue on Spiriva daily  Mucinex Twice daily  As needed  Cough/congestion  Flutter valve Three times a day  .  Albuterol Neb Twice daily  -use the precautions we discussed . Wear Oxygen 2l/m at rest and 3l/m with activity to maintain O2 sats >88-90%.  Activity as tolerated.  Please contact office for sooner follow up if symptoms do not improve or worsen or seek emergency care  Follow up with Dr. Melvyn Novas  Or Laithan Conchas NP in 2  weeks  and As needed

## 2021-01-16 NOTE — Progress Notes (Signed)
@Patient  ID: Paul [name], male    DOB: 08-23-1946, 75 y.o.   MRN: [id number]  Chief Complaint  Patient presents with  . COPD    Referring provider: [name], MD  HPI: 75 year old male former smoker quit in 2010 followed for severe gold 3 COPD and chronic respiratory failure on oxygen.  Patient is prone to recurrent COPD exacerbations and hospitalizations Medical history significant for A. Fib Had COVID-19 infection in January 2022 requiring hospitalization (treated with  IV steroid discharged on a prednisone taper)  TEST/EVENTS :  05/29/16: FVC 2.08 L (86%) FEV1 0.83 L (31%) FEV1/FVC 0.40FEF 25-75 0.30 L (14%) no bronchodilator response TLC 6.56 L (107%) RV 167% ERV 160% DLCO corrected 60% (hemoglobin 10.9) 07/15/13: FVC 2.68 L (60%) FEV1 1.63 L (53%) FEV1/FVC 0.61 FEF 25-75 0.76 L (26%) 02/04/12: FVC 2.18 L (57%) FEV1 1.09 L (36%) FEV1/FVC 0.50 FEF 25-75 0.33 L (11%)   CT sinus February 2021 no acute sinus disease noted CT chest February 2021 emphysematous changes, several old thoracic compression fractures  2D echo July 2020 EF 60-65%,  01/16/2021 Follow up : COPD , O2 RF , Covid 19  Patient returns for a close follow up of recent slow to resolve COPD exacerbation after 3  hospitalizations for COVID-19 infection.  Patient was initially treated early January for COVID-19 infection and COPD exacerbation.  He was treated with remdesivir and IV steroids discharged on a slow prednisone taper.  Unfortunately patient had a syncopal episode on January 14 with a head laceration requiring staples.  He was found to have a lingular pneumonia.  He was treated with IV antibiotics and discharged on a course of antibiotics to complete.  Syncopal episode was suspected secondary to pneumonia, dehydration and orthostasis.  He did have a CT head as he is on Eliquis.  CT head was negative.  Patient was discharged home but continued to have ongoing symptoms with increased cough shortness of breath  and congestion.  He was seen back in the office on January 21 and started on Levaquin and prednisone.  Unfortunately patient had an adverse reaction to Levaquin with palpitations .  Was changed over to doxycycline.  Patient continued to do poorly and was readmitted to the hospital.  Chest x-ray showed clearance of pneumonia.Patient has had significant anxiety and was started on Zoloft.  Lab work during hospitalization showed elevated lactate and LDH.  BNP was normal. Since discharge yesterday patient says he is feeling much better.  He still has a cough with thick mucus which is difficult to get up at times.  He is using his flutter valve and nebulizers.  Patient says his anxiety is a little bit better controlled. Feels that he is making slow progress.  Remains very weak and gets short of breath with minimum activity.  He has had no further syncopal episodes.  Palpitations and heart racing has decreased.   Allergies  Allergen Reactions  . Daliresp [Roflumilast] Diarrhea and Nausea And Vomiting  . Alendronate Anxiety and Other (See Comments)    Jittery/nervous  . Levaquin [Levofloxacin] Palpitations and Other (See Comments)    Made the B/P fluctuate and heart raced    Immunization History  Administered Date(s) Administered  . Fluad Quad(high Dose 65+) 10/02/2020  . Influenza Split 09/22/2013, 09/01/2015  . Influenza Whole 09/04/2011, 09/21/2012  . Influenza, High Dose Seasonal PF 09/02/2016, 09/10/2017, 08/31/2018, 09/24/2018, 10/21/2019  . Influenza-Unspecified 08/23/2014  . Moderna Sars-Covid-2 Vaccination 01/17/2020, 02/14/2020, 10/06/2020  . Pneumococcal Conjugate-13 01/11/2014, 12/09/2016  .  Pneumococcal Polysaccharide-23 08/23/2010, 08/29/2011  . Zoster Recombinat (Shingrix) 04/04/2014    Past Medical History:  Diagnosis Date  . A-fib ([place])   . Anemia   . Aortic atherosclerosis ([place])   . Atrial fibrillation ([place])   . Back pain   . COPD (chronic obstructive pulmonary disease)  ([place])   . Diabetes mellitus without complication ([place])   . Elevated PSA   . GAD (generalized anxiety disorder)   . High cholesterol   . Hypertension   . Osteoporosis   . Oxygen deficiency     Tobacco History: Social History   Tobacco Use  Smoking Status Former Smoker  . Packs/day: 2.00  . Years: 52.00  . Pack years: 104.00  . Types: Cigarettes  . Quit date: 02/03/2009  . Years since quitting: 11.9  Smokeless Tobacco Never Used  Tobacco Comment   Counseled to remain smoke free   Counseling given: Not Answered Comment: Counseled to remain smoke free   Outpatient Medications Prior to Visit  Medication Sig Dispense Refill  . acetaminophen (TYLENOL) 325 MG tablet Take 2 tablets (650 mg total) by mouth every 6 (six) hours as needed for mild pain or headache (fever >/= 101).    Marland Kitchen albuterol (PROAIR HFA) 108 (90 Base) MCG/ACT inhaler Inhale 2 puffs into the lungs every 6 (six) hours as needed for wheezing or shortness of breath.     Marland Kitchen albuterol (PROVENTIL) (2.5 MG/3ML) 0.083% nebulizer solution Take 3 mLs (2.5 mg total) by nebulization in the morning and at bedtime. (Patient taking differently: Take 2.5 mg by nebulization 4 (four) times daily.) 75 mL 6  . apixaban (ELIQUIS) 5 MG TABS tablet Take 1 tablet (5 mg total) by mouth 2 (two) times daily. 180 tablet 1  . ascorbic acid (VITAMIN C) 500 MG tablet Take 1 tablet (500 mg total) by mouth daily. (Patient taking differently: Take 1,000 mg by mouth daily.)    . atorvastatin (LIPITOR) 40 MG tablet TAKE 1 TABLET(40 MG) BY MOUTH DAILY AFTER SUPPER (Patient taking differently: Take 40 mg by mouth at bedtime.) 90 tablet 3  . benzonatate (TESSALON) 100 MG capsule Take 100-200 mg by mouth 3 (three) times daily as needed for cough.    . bisoprolol (ZEBETA) 5 MG tablet Take 1 tablet (5 mg total) by mouth daily. 90 tablet 0  . budesonide-formoterol (SYMBICORT) 160-4.5 MCG/ACT inhaler Inhale 2 puffs into the lungs 2 (two) times daily. 1 Inhaler 3  .  calcium carbonate (OS-CAL) 600 MG TABS tablet Take 600 mg by mouth daily.    . Cholecalciferol (VITAMIN D) 50 MCG (2000 UT) CAPS Take 2,000 Units by mouth daily.    Marland Kitchen denosumab (PROLIA) 60 MG/ML SOSY injection Inject 60 mg into the skin every 6 (six) months.    . diltiazem (CARDIZEM CD) 120 MG 24 hr capsule Take 1 capsule (120 mg total) by mouth daily. 30 capsule 0  . doxycycline (VIBRA-TABS) 100 MG tablet Take 1 tablet (100 mg total) by mouth 2 (two) times daily. 14 tablet 0  . furosemide (LASIX) 40 MG tablet Take 1 tablet (40 mg total) by mouth 2 (two) times daily. 180 tablet 3  . guaiFENesin (MUCINEX) 600 MG 12 hr tablet Take 1,200 mg by mouth 2 (two) times daily.    Marland Kitchen loratadine (CLARITIN) 10 MG tablet Take 10 mg by mouth in the morning.    . Melatonin 5 MG TABS Take 5 mg by mouth at bedtime.    . Multiple Vitamin (MULTIVITAMIN WITH MINERALS) TABS  tablet Take 1 tablet by mouth daily.    . niacinamide 500 MG tablet Take 500 mg by mouth daily.    Marland Kitchen omeprazole (PRILOSEC OTC) 20 MG tablet Take 40 mg by mouth 2 (two) times daily before a meal.    . OXYGEN Inhale 2 L/min into the lungs See admin instructions. Inhale 2 L/min of oxygen into the lungs at bedtime and when ambulatory    . predniSONE (DELTASONE) 10 MG tablet Take 4 tablets (40 mg total) by mouth daily with breakfast for 2 days, THEN 4 tablets (40 mg total) daily with breakfast for 3 days, THEN 3.5 tablets (35 mg total) daily with breakfast for 3 days, THEN 3.5 tablets (35 mg total) daily with breakfast for 3 days, THEN 3 tablets (30 mg total) daily with breakfast for 3 days, THEN 3 tablets (30 mg total) daily with breakfast for 3 days, THEN 2.5 tablets (25 mg total) daily with breakfast for 3 days, THEN 2.5 tablets (25 mg total) daily with breakfast for 3 days, THEN 2 tablets (20 mg total) daily with breakfast for 3 days. 26 tablet 0  . predniSONE (DELTASONE) 5 MG tablet Take 20-40 mg by mouth See admin instructions. Take 40 mg by mouth with  food daily for three days and decrease by 2.5 mg every three days until at a dose of 20 mg daily. Remain at 20 mg daily until follow-up appt with pulmonologist.    . sertraline (ZOLOFT) 25 MG tablet Take 1 tablet (25 mg total) by mouth at bedtime. 90 tablet 0  . sodium chloride (OCEAN) 0.65 % nasal spray Place 1 spray into the nose as needed for congestion.    . Tiotropium Bromide Monohydrate (SPIRIVA RESPIMAT) 2.5 MCG/ACT AERS Inhale 2 puffs into the lungs daily. 4 g 0   No facility-administered medications prior to visit.     Review of Systems:   Constitutional:   No  weight loss, night sweats,  Fevers, chills,  +fatigue, or  lassitude.  HEENT:   No headaches,  Difficulty swallowing,  Tooth/dental problems, or  Sore throat,                No sneezing, itching, ear ache,  +nasal congestion, post nasal drip,   CV:  No chest pain,  Orthopnea, PND, swelling in lower extremities, anasarca, dizziness, palpitations, syncope.   GI  No heartburn, indigestion, abdominal pain, nausea, vomiting, diarrhea, change in bowel habits, loss of appetite, bloody stools.   Resp:    No chest wall deformity  Skin: no rash or lesions.  GU: no dysuria, change in color of urine, no urgency or frequency.  No flank pain, no hematuria   MS:  No joint pain or swelling.  No decreased range of motion.  No back pain.    Physical Exam  BP 132/64 (BP Location: Left Arm, Cuff Size: Normal)   Pulse 88   Temp 97.6 F (36.4 C) (Other (Comment)) Comment (Src): wrist  Ht 5\' 6"  (1.676 m)   Wt 153 lb (69.4 kg)   SpO2 93%   BMI 24.69 kg/m   GEN: A/Ox3; pleasant , NAD, elderly ,chronically ill appearing , on O2    HEENT:  [place]/AT,   NOSE-clear, THROAT-clear, no lesions, no postnasal drip or exudate noted.   NECK:  Supple w/ fair ROM; no JVD; normal carotid impulses w/o bruits; no thyromegaly or nodules palpated; no lymphadenopathy.    RESP decreased breath sounds in the bases .  no accessory muscle use,  no  dullness to percussion  CARD:  RRR, no m/r/g, trace to none peripheral edema, pulses intact, no cyanosis or clubbing.  GI:   Soft & nt; nml bowel sounds; no organomegaly or masses detected.   Musco: Warm bil, no deformities or joint swelling noted.   Neuro: alert, no focal deficits noted.    Skin: Warm, no lesions or rashes    Lab Results:  CBC    Component Value Date/Time   WBC 9.5 01/15/2021 0423   RBC 4.02 (L) 01/15/2021 0423   HGB 10.3 (L) 01/15/2021 0423   HGB 11.6 (L) 11/20/2020 1202   HCT 32.2 (L) 01/15/2021 0423   HCT 36.9 (L) 11/20/2020 1202   PLT 190 01/15/2021 0423   PLT 322 11/20/2020 1202   MCV 80.1 01/15/2021 0423   MCV 79 11/20/2020 1202   MCH 25.6 (L) 01/15/2021 0423   MCHC 32.0 01/15/2021 0423   RDW 16.9 (H) 01/15/2021 0423   RDW 14.6 11/20/2020 1202   LYMPHSABS 0.5 (L) 01/15/2021 0423   LYMPHSABS 3.5 (H) 11/20/2020 1202   MONOABS 0.3 01/15/2021 0423   EOSABS 0.0 01/15/2021 0423   EOSABS 0.1 11/20/2020 1202   BASOSABS 0.0 01/15/2021 0423   BASOSABS 0.0 11/20/2020 1202    BMET    Component Value Date/Time   NA 140 01/15/2021 0423   NA 144 11/20/2020 1202   K 4.3 01/15/2021 0423   CL 98 01/15/2021 0423   CO2 27 01/15/2021 0423   GLUCOSE 193 (H) 01/15/2021 0423   BUN 23 01/15/2021 0423   BUN 18 11/20/2020 1202   CREATININE 1.01 01/15/2021 0423   CALCIUM 8.8 (L) 01/15/2021 0423   GFRNONAA >60 01/15/2021 0423   GFRAA 107 11/20/2020 1202    BNP    Component Value Date/Time   BNP 68.6 01/14/2021 0858    ProBNP No results found for: PROBNP  Imaging: DG Chest 2 View  Result Date: 01/05/2021 CLINICAL DATA:  Syncope and fall today.  Shortness of breath. EXAM: CHEST - 2 VIEW COMPARISON:  Single-view of the chest 12/24/2020 and 12/07/2020. CT chest 03/27/2020. FINDINGS: The lungs are emphysematous. There is a hazy focus of airspace opacity in the lingula which is new since the prior exams. The right lung is clear. No pneumothorax or pleural  effusion. Heart size is normal. Aortic atherosclerosis. No acute or focal bony abnormality. IMPRESSION: Hazy airspace opacity in lingula worrisome for pneumonia. Aortic Atherosclerosis (ICD10-I70.0) and Emphysema (ICD10-J43.9). Electronically Signed   By: Drusilla Kannerhomas  Dalessio M.D.   On: 01/05/2021 13:44   CT Head Wo Contrast  Result Date: 01/05/2021 CLINICAL DATA:  Syncope with fall. EXAM: CT HEAD WITHOUT CONTRAST CT CERVICAL SPINE WITHOUT CONTRAST TECHNIQUE: Multidetector CT imaging of the head and cervical spine was performed following the standard protocol without intravenous contrast. Multiplanar CT image reconstructions of the cervical spine were also generated. COMPARISON:  Head CT 03/27/2020. FINDINGS: CT HEAD FINDINGS Brain: There is no evidence for acute hemorrhage, hydrocephalus, mass lesion, or abnormal extra-axial fluid collection. No definite CT evidence for acute infarction. Diffuse loss of parenchymal volume is consistent with atrophy. Patchy low attenuation in the deep hemispheric and periventricular white matter is nonspecific, but likely reflects chronic microvascular ischemic demyelination. Vascular: No hyperdense vessel or unexpected calcification. Skull: No evidence for fracture. No worrisome lytic or sclerotic lesion. Sinuses/Orbits: The visualized paranasal sinuses and mastoid air cells are clear. Visualized portions of the globes and intraorbital fat are unremarkable. Other: Soft tissue contusion noted left parietal scalp. CT  CERVICAL SPINE FINDINGS Alignment: Straightening of normal cervical lordosis. No subluxation. Skull base and vertebrae: No acute fracture. No primary bone lesion or focal pathologic process. Soft tissues and spinal canal: No prevertebral fluid or swelling. No visible canal hematoma. Disc levels: Loss of disc height noted C2-3 and C5-6. Diffuse facet degeneration noted bilaterally. Upper chest: Centrilobular emphysema with calcific pleuroparenchymal scarring in both  apices. Other: None. IMPRESSION: 1. No acute intracranial abnormality. Atrophy with chronic small vessel white matter ischemic disease. 2. Soft tissue contusion left parietal scalp. 3. Degenerative changes in the cervical spine without fracture. 4. Loss of cervical lordosis. This can be related to patient positioning, muscle spasm or soft tissue injury. Electronically Signed   By: Misty Stanley M.D.   On: 01/05/2021 13:44   CT Cervical Spine Wo Contrast  Result Date: 01/05/2021 CLINICAL DATA:  Syncope with fall. EXAM: CT HEAD WITHOUT CONTRAST CT CERVICAL SPINE WITHOUT CONTRAST TECHNIQUE: Multidetector CT imaging of the head and cervical spine was performed following the standard protocol without intravenous contrast. Multiplanar CT image reconstructions of the cervical spine were also generated. COMPARISON:  Head CT 03/27/2020. FINDINGS: CT HEAD FINDINGS Brain: There is no evidence for acute hemorrhage, hydrocephalus, mass lesion, or abnormal extra-axial fluid collection. No definite CT evidence for acute infarction. Diffuse loss of parenchymal volume is consistent with atrophy. Patchy low attenuation in the deep hemispheric and periventricular white matter is nonspecific, but likely reflects chronic microvascular ischemic demyelination. Vascular: No hyperdense vessel or unexpected calcification. Skull: No evidence for fracture. No worrisome lytic or sclerotic lesion. Sinuses/Orbits: The visualized paranasal sinuses and mastoid air cells are clear. Visualized portions of the globes and intraorbital fat are unremarkable. Other: Soft tissue contusion noted left parietal scalp. CT CERVICAL SPINE FINDINGS Alignment: Straightening of normal cervical lordosis. No subluxation. Skull base and vertebrae: No acute fracture. No primary bone lesion or focal pathologic process. Soft tissues and spinal canal: No prevertebral fluid or swelling. No visible canal hematoma. Disc levels: Loss of disc height noted C2-3 and C5-6.  Diffuse facet degeneration noted bilaterally. Upper chest: Centrilobular emphysema with calcific pleuroparenchymal scarring in both apices. Other: None. IMPRESSION: 1. No acute intracranial abnormality. Atrophy with chronic small vessel white matter ischemic disease. 2. Soft tissue contusion left parietal scalp. 3. Degenerative changes in the cervical spine without fracture. 4. Loss of cervical lordosis. This can be related to patient positioning, muscle spasm or soft tissue injury. Electronically Signed   By: Misty Stanley M.D.   On: 01/05/2021 13:44   DG Chest Portable 1 View  Result Date: 01/14/2021 CLINICAL DATA:  75 year old male with history of shortness of breath and chest pain since yesterday evening. EXAM: PORTABLE CHEST 1 VIEW COMPARISON:  Chest x-ray 01/05/2021. FINDINGS: Lung volumes are normal. No consolidative airspace disease. No pleural effusions. No pneumothorax. Small calcified granulomas in the right upper lobe, similar to the prior study. No other pulmonary nodule or mass noted. Pulmonary vasculature and the cardiomediastinal silhouette are within normal limits. Atherosclerosis in the thoracic aorta. IMPRESSION: 1. No radiographic evidence of acute cardiopulmonary disease. 2. Aortic atherosclerosis. Electronically Signed   By: Vinnie Langton M.D.   On: 01/14/2021 10:45   DG Chest Port 1 View  Result Date: 12/24/2020 CLINICAL DATA:  Chest pain, cough and fever. EXAM: PORTABLE CHEST 1 VIEW COMPARISON:  12/07/2020 FINDINGS: The cardiac silhouette, mediastinal and hilar contours are within normal limits and stable. There is mild tortuosity and calcification the thoracic aorta. Stable underlying emphysematous changes and hyperinflation. No  acute overlying pulmonary process. No pleural effusions or pulmonary lesions. The bony thorax is intact. IMPRESSION: Emphysematous changes but no acute overlying pulmonary process. Electronically Signed   By: Marijo Sanes M.D.   On: 12/24/2020 10:56       PFT Results Latest Ref Rng & Units 05/29/2016  FVC-Pre L 1.92  FVC-Predicted Pre % 52  Pre FEV1/FVC % % 41  FEV1-Pre L 0.78  FEV1-Predicted Pre % 29    No results found for: NITRICOXIDE      Assessment & Plan:   COPD GOLD  III  Severe COPD with high symptom burden-recent slow to resolve exacerbation post COVID-19 infection.  Patient has been hospitalized 3 times with ongoing lingering symptoms.  Patient did have a lingular pneumonia that has cleared status post antibiotics.  Unfortunately patient has had some medication side effects. Now seems to be turning the corner.  Patient is encouraged on activity as tolerated as he has severe deconditioning.  We will slowly taper down prednisone and hold at 20 mg.  He is to finish up his course of doxycycline.  Chest x-ray shows clearance of his pneumonia . Continue on aggressive pulmonary hygiene regimen On return visit consider referral to pulmonary rehab  Plan  Patient Instructions  Finish Doxycycline as directed.  Decrease Prednisone dose by 2.5mg  every 3 days until at  Prednisone 20mg  daily -hold at this dose.  Continue on Symbicort 2 puffs Twice daily   Continue on Spiriva daily  Mucinex Twice daily  As needed  Cough/congestion  Flutter valve Three times a day  .  Albuterol Neb Twice daily  -use the precautions we discussed . Wear Oxygen 2l/m at rest and 3l/m with activity to maintain O2 sats >88-90%.  Activity as tolerated.  Please contact office for sooner follow up if symptoms do not improve or worsen or seek emergency care  Follow up with Dr. Melvyn Novas  Or Parrett NP in 2  weeks  and As needed        Lingular pneumonia Resolved on chest x-ray.  Finish up antibiotic course currently.  Plan  Patient Instructions  Finish Doxycycline as directed.  Decrease Prednisone dose by 2.5mg  every 3 days until at  Prednisone 20mg  daily -hold at this dose.  Continue on Symbicort 2 puffs Twice daily   Continue on Spiriva daily   Mucinex Twice daily  As needed  Cough/congestion  Flutter valve Three times a day  .  Albuterol Neb Twice daily  -use the precautions we discussed . Wear Oxygen 2l/m at rest and 3l/m with activity to maintain O2 sats >88-90%.  Activity as tolerated.  Please contact office for sooner follow up if symptoms do not improve or worsen or seek emergency care  Follow up with Dr. Melvyn Novas  Or Parrett NP in 2  weeks  and As needed        Syncope No further episodes.  Patient is to follow-up with cardiology as discussed.  Most recent episode of syncope with hospitalization most likely secondary to associated recent COVID-19 infection with ongoing illness associated orthostasis and pneumonia.  Seems to be improved.  Patient is encouraged on changing positions slowly adequate hydration  Plan  Patient Instructions  Finish Doxycycline as directed.  Decrease Prednisone dose by 2.5mg  every 3 days until at  Prednisone 20mg  daily -hold at this dose.  Continue on Symbicort 2 puffs Twice daily   Continue on Spiriva daily  Mucinex Twice daily  As needed  Cough/congestion  Flutter valve Three  times a day  .  Albuterol Neb Twice daily  -use the precautions we discussed . Wear Oxygen 2l/m at rest and 3l/m with activity to maintain O2 sats >88-90%.  Activity as tolerated.  Please contact office for sooner follow up if symptoms do not improve or worsen or seek emergency care  Follow up with Dr. Melvyn Novas  Or Parrett NP in 2  weeks  and As needed        Chronic respiratory failure with hypoxia ([place]) No increased oxygen demands.  Patient is continue on oxygen 2 L at rest and 3 L with activity.  Physical deconditioning Continue activity as tolerated.  Consider pulmonary rehab on return visit  Plan  Patient Instructions  Finish Doxycycline as directed.  Decrease Prednisone dose by 2.5mg  every 3 days until at  Prednisone 20mg  daily -hold at this dose.  Continue on Symbicort 2 puffs Twice daily   Continue on  Spiriva daily  Mucinex Twice daily  As needed  Cough/congestion  Flutter valve Three times a day  .  Albuterol Neb Twice daily  -use the precautions we discussed . Wear Oxygen 2l/m at rest and 3l/m with activity to maintain O2 sats >88-90%.  Activity as tolerated.  Please contact office for sooner follow up if symptoms do not improve or worsen or seek emergency care  Follow up with Dr. Melvyn Novas  Or Parrett NP in 2  weeks  and As needed        COVID-19 Patient is making slow clinical progress.  He has had multiple complications and ongoing slow to resolve COPD exacerbation. Continue current regimen and supportive care. During recent hospitalization LDH and lactate levels remain high. We will reevaluate on return and consider repeat labs if indicated     Rexene Edison, NP 01/16/2021

## 2021-01-16 NOTE — Assessment & Plan Note (Signed)
No increased oxygen demands.  Patient is continue on oxygen 2 L at rest and 3 L with activity.

## 2021-01-19 ENCOUNTER — Other Ambulatory Visit: Payer: Self-pay

## 2021-01-19 ENCOUNTER — Ambulatory Visit (INDEPENDENT_AMBULATORY_CARE_PROVIDER_SITE_OTHER): Payer: Medicare Other | Admitting: Cardiology

## 2021-01-19 ENCOUNTER — Encounter: Payer: Self-pay | Admitting: Cardiology

## 2021-01-19 VITALS — BP 140/60 | HR 91 | Ht 66.0 in | Wt 152.0 lb

## 2021-01-19 DIAGNOSIS — I5032 Chronic diastolic (congestive) heart failure: Secondary | ICD-10-CM | POA: Diagnosis not present

## 2021-01-19 DIAGNOSIS — I480 Paroxysmal atrial fibrillation: Secondary | ICD-10-CM | POA: Diagnosis not present

## 2021-01-19 DIAGNOSIS — I10 Essential (primary) hypertension: Secondary | ICD-10-CM

## 2021-01-19 LAB — CULTURE, BLOOD (ROUTINE X 2)
Culture: NO GROWTH
Culture: NO GROWTH
Special Requests: ADEQUATE
Special Requests: ADEQUATE

## 2021-01-19 MED ORDER — DILTIAZEM HCL ER COATED BEADS 120 MG PO CP24: 120.0000 mg | ORAL_CAPSULE | Freq: Every day | ORAL | 3 refills | Status: DC

## 2021-01-19 MED ORDER — FUROSEMIDE 40 MG PO TABS: 40.0000 mg | ORAL_TABLET | Freq: Every day | ORAL | 3 refills | Status: DC

## 2021-01-19 MED ORDER — BISOPROLOL FUMARATE 5 MG PO TABS: 5.0000 mg | ORAL_TABLET | Freq: Every day | ORAL | 3 refills | Status: DC

## 2021-01-19 MED ORDER — DILTIAZEM HCL 60 MG PO TABS: 60.0000 mg | ORAL_TABLET | Freq: Four times a day (QID) | ORAL | 99 refills | Status: DC | PRN

## 2021-01-19 NOTE — Progress Notes (Signed)
Cardiology Office Note:    Date:  01/19/2021   ID:  Paul [name], DOB [birth year], MRN [id number]  PCP:  [name], MD  [facility] Cardiologist:  Candee Furbish, MD  [facility] Electrophysiologist:  None   Referring MD: [name], MD     History of Present Illness:    Paul [name] is a 75 y.o. male with severe COPD chronic respiratory failure on home oxygen with recurrent exacerbations here for follow-up.  Recent pulmonary clinic note from 01/16/2021 reviewed.  Has paroxysmal atrial fibrillation lower extremity edema.  Prior pleural effusion associated with COPD A. Fib.  Prior echocardiogram July 2020 showed EF of 60 to 65%, normal.  FEV1 31% in 2017.  He was hospitalized had an episode of syncope suspected be secondary to dehydration orthostasis and pneumonia.  CT of the head was negative.  Still remains quite weak.  Short of breath with minimal activity.  No further syncope.  Decreased palpitations.  Previously diltiazem decreased to 120 mg a day.  This was changed on prior discharge.  He used to take 180 a day  Sometimes he will feel some faster heart rates.  He is being careful.  He also asked if he could maybe pull back a little bit on his Lasix from 40 twice daily to 40 once a day.  Has some occasional burning sensation in his chest when he takes a deep breath or coughs.  Past Medical History:  Diagnosis Date  . A-fib ([place])   . Anemia   . Aortic atherosclerosis ([place])   . Atrial fibrillation ([place])   . Back pain   . COPD (chronic obstructive pulmonary disease) ([place])   . Diabetes mellitus without complication ([place])   . Elevated PSA   . GAD (generalized anxiety disorder)   . High cholesterol   . Hypertension   . Osteoporosis   . Oxygen deficiency     Past Surgical History:  Procedure Laterality Date  . CATARACT EXTRACTION Right   . SKIN SURGERY     shoulders and chest    Current Medications: Current Meds  Medication Sig  . acetaminophen (TYLENOL)  325 MG tablet Take 2 tablets (650 mg total) by mouth every 6 (six) hours as needed for mild pain or headache (fever >/= 101).  Marland Kitchen albuterol (PROAIR HFA) 108 (90 Base) MCG/ACT inhaler Inhale 2 puffs into the lungs every 6 (six) hours as needed for wheezing or shortness of breath.   Marland Kitchen albuterol (PROVENTIL) (2.5 MG/3ML) 0.083% nebulizer solution Take 3 mLs (2.5 mg total) by nebulization in the morning and at bedtime. (Patient taking differently: Take 2.5 mg by nebulization 4 (four) times daily.)  . apixaban (ELIQUIS) 5 MG TABS tablet Take 1 tablet (5 mg total) by mouth 2 (two) times daily.  Marland Kitchen ascorbic acid (VITAMIN C) 500 MG tablet Take 1 tablet (500 mg total) by mouth daily. (Patient taking differently: Take 1,000 mg by mouth daily.)  . atorvastatin (LIPITOR) 40 MG tablet TAKE 1 TABLET(40 MG) BY MOUTH DAILY AFTER SUPPER (Patient taking differently: Take 40 mg by mouth at bedtime.)  . benzonatate (TESSALON) 100 MG capsule Take 100-200 mg by mouth 3 (three) times daily as needed for cough.  . budesonide-formoterol (SYMBICORT) 160-4.5 MCG/ACT inhaler Inhale 2 puffs into the lungs 2 (two) times daily.  . calcium carbonate (OS-CAL) 600 MG TABS tablet Take 600 mg by mouth daily.  . Cholecalciferol (VITAMIN D) 50 MCG (2000 UT) CAPS Take 2,000 Units by mouth daily.  Marland Kitchen denosumab (PROLIA)  60 MG/ML SOSY injection Inject 60 mg into the skin every 6 (six) months.  . diltiazem (CARDIZEM) 60 MG tablet Take 1 tablet (60 mg total) by mouth 4 (four) times daily as needed.  Marland Kitchen guaiFENesin (MUCINEX) 600 MG 12 hr tablet Take 1,200 mg by mouth 2 (two) times daily.  Marland Kitchen loratadine (CLARITIN) 10 MG tablet Take 10 mg by mouth in the morning.  . Melatonin 5 MG TABS Take 5 mg by mouth at bedtime.  . Multiple Vitamin (MULTIVITAMIN WITH MINERALS) TABS tablet Take 1 tablet by mouth daily.  . niacinamide 500 MG tablet Take 500 mg by mouth daily.  Marland Kitchen omeprazole (PRILOSEC OTC) 20 MG tablet Take 40 mg by mouth 2 (two) times daily before a  meal.  . OXYGEN Inhale 2 L/min into the lungs See admin instructions. Inhale 2 L/min of oxygen into the lungs at bedtime and when ambulatory  . predniSONE (DELTASONE) 10 MG tablet Take 4 tablets (40 mg total) by mouth daily with breakfast for 2 days, THEN 4 tablets (40 mg total) daily with breakfast for 3 days, THEN 3.5 tablets (35 mg total) daily with breakfast for 3 days, THEN 3.5 tablets (35 mg total) daily with breakfast for 3 days, THEN 3 tablets (30 mg total) daily with breakfast for 3 days, THEN 3 tablets (30 mg total) daily with breakfast for 3 days, THEN 2.5 tablets (25 mg total) daily with breakfast for 3 days, THEN 2.5 tablets (25 mg total) daily with breakfast for 3 days, THEN 2 tablets (20 mg total) daily with breakfast for 3 days.  . predniSONE (DELTASONE) 5 MG tablet Take 20-40 mg by mouth See admin instructions. Take 40 mg by mouth with food daily for three days and decrease by 2.5 mg every three days until at a dose of 20 mg daily. Remain at 20 mg daily until follow-up appt with pulmonologist.  . sertraline (ZOLOFT) 25 MG tablet Take 1 tablet (25 mg total) by mouth at bedtime.  . sodium chloride (OCEAN) 0.65 % nasal spray Place 1 spray into the nose as needed for congestion.  . Tiotropium Bromide Monohydrate (SPIRIVA RESPIMAT) 2.5 MCG/ACT AERS Inhale 2 puffs into the lungs daily.  . [DISCONTINUED] bisoprolol (ZEBETA) 5 MG tablet Take 1 tablet (5 mg total) by mouth daily.  . [DISCONTINUED] diltiazem (CARDIZEM CD) 120 MG 24 hr capsule Take 1 capsule (120 mg total) by mouth daily.  . [DISCONTINUED] furosemide (LASIX) 40 MG tablet Take 1 tablet (40 mg total) by mouth 2 (two) times daily.     Allergies:   Daliresp [roflumilast], Alendronate, and Levaquin [levofloxacin]   Social History   Socioeconomic History  . Marital status: Married    Spouse name: Not on file  . Number of children: 3  . Years of education: Not on file  . Highest education level: Not on file  Occupational History   . Occupation: retired    Comment: truck Geophysicist/field seismologist  Tobacco Use  . Smoking status: Former Smoker    Packs/day: 2.00    Years: 52.00    Pack years: 104.00    Types: Cigarettes    Quit date: 02/03/2009    Years since quitting: 11.9  . Smokeless tobacco: Never Used  . Tobacco comment: Counseled to remain smoke free  Vaping Use  . Vaping Use: Never used  Substance and Sexual Activity  . Alcohol use: Not Currently    Alcohol/week: 0.0 standard drinks    Comment: occ  . Drug use: No  .  Sexual activity: Not on file  Other Topics Concern  . Not on file  Social History Narrative   Originally from [place]. Previously worked driving a Restaurant manager, fast food. No pets currently. No mold exposure. 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       wears sunscreen, brushes and flosses daily, see's dentist bi-annually, has smoke/carbon monoxide detectors, wears a seatbelt and practices gun safety      Social Determinants of Health   Financial Resource Strain: Not on file  Food Insecurity: No Food Insecurity  . Worried About Charity fundraiser in the Last Year: Never true  . Ran Out of Food in the Last Year: Never true  Transportation Needs: No Transportation Needs  . Lack of Transportation (Medical): No  . Lack of Transportation (Non-Medical): No  Physical Activity: Inactive  . Days of Exercise per Week: 0 days  . Minutes of Exercise per Session: 0 min  Stress: Not on file  Social Connections: Not on file     Family History: The patient's family history includes Cancer in his paternal uncle; Heart disease in his brother, father, and mother.  ROS:   Please see the history of present illness.     All other systems reviewed and are negative.  EKGs/Labs/Other Studies Reviewed:     Recent Labs: 01/14/2021: ALT 31; B Natriuretic Peptide 68.6 01/15/2021: BUN 23; Creatinine, Ser 1.01; Hemoglobin 10.3; Platelets 190; Potassium 4.3; Sodium 140   Recent Lipid Panel    Component Value Date/Time   CHOL 172 11/20/2020 1202   TRIG 62 12/25/2020 0949   HDL 60 11/20/2020 1202   CHOLHDL 2.9 11/20/2020 1202   LDLCALC 90 11/20/2020 1202     Risk Assessment/Calculations:      Physical Exam:    VS:  BP 140/60   Pulse 91   Ht 5\' 6"  (1.676 m)   Wt 152 lb (68.9 kg)   SpO2 94%   BMI 24.53 kg/m     Wt Readings from Last 3 Encounters:  01/19/21 152 lb (68.9 kg)  01/16/21 153 lb (69.4 kg)  01/14/21 149 lb 4.8 oz (67.7 kg)     GEN:  Well nourished, well developed in no acute distress HEENT: Normal, on home O2 NECK: No JVD; No carotid bruits LYMPHATICS: No lymphadenopathy CARDIAC: RRR, no murmurs, rubs, gallops RESPIRATORY:  Clear to auscultation without rales, wheezing or rhonchi  ABDOMEN: Soft, non-tender, non-distended MUSCULOSKELETAL:  No edema; No deformity  SKIN: Warm and dry NEUROLOGIC:  Alert and oriented x 3 PSYCHIATRIC:  Normal affect   ASSESSMENT:    1. Paroxysmal atrial fibrillation (HCC)   2. Essential hypertension   3. Chronic diastolic CHF (congestive heart failure) (HCC)    PLAN:    In order of problems listed above:  Paroxysmal atrial fibrillation -Previously decreased his diltiazem to 120.  Doing well.  Maintaining good rate control.  Occasionally he will feel some faster heart rates.  We will give him a short acting diltiazem 60 mg to take as needed. -Continue with bisoprolol as well.  Chronic anticoagulation -On Eliquis.  Doing well.  No head bleed. -Hemoglobin stable.  10.3.  Creatinine 1.01   Severe COPD -Pulmonary notes reviewed.  Several hospitalizations.  Continuing to be careful.  Syncope -We will pull back on his Lasix to 40 mg once a day.  Could have had a orthostatic component.  He knows to take the additional Lasix if he  starts to gain fluid weight.  Chronic diastolic heart failure -Explained to them the physiology.  His wife asked why he had heart failure on his list of  diagnoses.        Medication Adjustments/Labs and Tests Ordered: Current medicines are reviewed at length with the patient today.  Concerns regarding medicines are outlined above.  No orders of the defined types were placed in this encounter.  Meds ordered this encounter  Medications  . DISCONTD: diltiazem (CARDIZEM CD) 120 MG 24 hr capsule    Sig: Take 1 capsule (120 mg total) by mouth daily.    Dispense:  90 capsule    Refill:  3  . bisoprolol (ZEBETA) 5 MG tablet    Sig: Take 1 tablet (5 mg total) by mouth daily.    Dispense:  90 tablet    Refill:  3  . diltiazem (CARDIZEM) 60 MG tablet    Sig: Take 1 tablet (60 mg total) by mouth 4 (four) times daily as needed.    Dispense:  30 tablet    Refill:  prn  . furosemide (LASIX) 40 MG tablet    Sig: Take 1 tablet (40 mg total) by mouth daily.    Dispense:  90 tablet    Refill:  3  . diltiazem (CARDIZEM CD) 120 MG 24 hr capsule    Sig: Take 1 capsule (120 mg total) by mouth daily.    Dispense:  90 capsule    Refill:  3    Patient Instructions  Medication Instructions:  Please take Furosemide 40 mg daily.  You may take extra if weight starts increasing. You may take Diltiazem 60 as needed up to 4 times a day. Continue all other medications as listed.  *If you need a refill on your cardiac medications before your next appointment, please call your pharmacy*  Follow-Up: At [facility], you and your health needs are our priority.  As part of our continuing mission to provide you with exceptional heart care, we have created designated Provider Care Teams.  These Care Teams include your primary Cardiologist (physician) and Advanced Practice Providers (APPs -  Physician Assistants and Nurse Practitioners) who all work together to provide you with the care you need, when you need it.  We recommend signing up for the patient portal called "MyChart".  Sign up information is provided on this After Visit Summary.  MyChart is used to  connect with patients for Virtual Visits (Telemedicine).  Patients are able to view lab/test results, encounter notes, upcoming appointments, etc.  Non-urgent messages can be sent to your provider as well.   To learn more about what you can do with MyChart, go to [email or web address].    Your next appointment:   4 month(s)  The format for your next appointment:   In Person  Provider:   Candee Furbish, MD       Signed, Candee Furbish, MD  01/19/2021 9:32 AM    [place]

## 2021-01-19 NOTE — Patient Instructions (Signed)
Medication Instructions:  Please take Furosemide 40 mg daily.  You may take extra if weight starts increasing. You may take Diltiazem 60 as needed up to 4 times a day. Continue all other medications as listed.  *If you need a refill on your cardiac medications before your next appointment, please call your pharmacy*  Follow-Up: At [facility], you and your health needs are our priority.  As part of our continuing mission to provide you with exceptional heart care, we have created designated Provider Care Teams.  These Care Teams include your primary Cardiologist (physician) and Advanced Practice Providers (APPs -  Physician Assistants and Nurse Practitioners) who all work together to provide you with the care you need, when you need it.  We recommend signing up for the patient portal called "MyChart".  Sign up information is provided on this After Visit Summary.  MyChart is used to connect with patients for Virtual Visits (Telemedicine).  Patients are able to view lab/test results, encounter notes, upcoming appointments, etc.  Non-urgent messages can be sent to your provider as well.   To learn more about what you can do with MyChart, go to [email or web address].    Your next appointment:   4 month(s)  The format for your next appointment:   In Person  Provider:   Candee Furbish, MD

## 2021-01-22 ENCOUNTER — Other Ambulatory Visit: Payer: Self-pay

## 2021-01-22 ENCOUNTER — Ambulatory Visit (INDEPENDENT_AMBULATORY_CARE_PROVIDER_SITE_OTHER): Payer: Medicare Other | Admitting: Family Medicine

## 2021-01-22 VITALS — BP 122/54 | HR 96 | Temp 97.4°F | Ht 66.0 in | Wt 150.0 lb

## 2021-01-22 DIAGNOSIS — J9611 Chronic respiratory failure with hypoxia: Secondary | ICD-10-CM | POA: Diagnosis not present

## 2021-01-22 DIAGNOSIS — I482 Chronic atrial fibrillation, unspecified: Secondary | ICD-10-CM

## 2021-01-22 DIAGNOSIS — D6869 Other thrombophilia: Secondary | ICD-10-CM | POA: Diagnosis not present

## 2021-01-22 DIAGNOSIS — R55 Syncope and collapse: Secondary | ICD-10-CM

## 2021-01-22 DIAGNOSIS — J449 Chronic obstructive pulmonary disease, unspecified: Secondary | ICD-10-CM

## 2021-01-22 NOTE — Progress Notes (Signed)
Subjective:  Patient ID: Paul [name], male    DOB: [birth year]  Age: 75 y.o. MRN: [id number]  Chief Complaint  Patient presents with  . Hospitalization Follow-up    COPD exacerbation, Covid positive, Pneumonia    HPI Patient presents for a hospital follow up from [facility] and [facility]. He was diagnosed with copd exacerbation and covid 19 pneumonia in early [place]. He had remdesivir, iv steroids and discharged on slow prednisone taper. He was readmitted on January 14th after a syncopal episode. He was foun dto have a lingulair pneumonia and dehydration. He had staples to close his head laceration. CT head was negative. He returned to pulmonology for hospital follow up on 01/12/2021 and was started on levaquin and an increased dose of prednisone. Levaquin had to be changed to doxycycline due to side effect of palpitations which developed. He continued to worsen and required a 3rd admission in January. His pneumonia was cleared on repeat cxr on this 3rd admission. He was started on zoloft for his anxiety.   Patient is feeling week. No dizziness. Complaining chest pain to back pain. Last cxr was good. Appetite good. Drinking plenty of fluids. No pain elsewhere. Patient is O2 at 2 Liters at rest and 3 Liters with activity.   Current Outpatient Medications on File Prior to Visit  Medication Sig Dispense Refill  . acetaminophen (TYLENOL) 325 MG tablet Take 2 tablets (650 mg total) by mouth every 6 (six) hours as needed for mild pain or headache (fever >/= 101).    Marland Kitchen albuterol (PROAIR HFA) 108 (90 Base) MCG/ACT inhaler Inhale 2 puffs into the lungs every 6 (six) hours as needed for wheezing or shortness of breath.     Marland Kitchen albuterol (PROVENTIL) (2.5 MG/3ML) 0.083% nebulizer solution Take 3 mLs (2.5 mg total) by nebulization in the morning and at bedtime. (Patient taking differently: Take 2.5 mg by nebulization 4 (four) times daily.) 75 mL 6  . apixaban (ELIQUIS) 5 MG TABS tablet Take 1 tablet (5 mg  total) by mouth 2 (two) times daily. 180 tablet 1  . ascorbic acid (VITAMIN C) 500 MG tablet Take 1 tablet (500 mg total) by mouth daily. (Patient taking differently: Take 1,000 mg by mouth daily.)    . atorvastatin (LIPITOR) 40 MG tablet TAKE 1 TABLET(40 MG) BY MOUTH DAILY AFTER SUPPER (Patient taking differently: Take 40 mg by mouth at bedtime.) 90 tablet 3  . bisoprolol (ZEBETA) 5 MG tablet Take 1 tablet (5 mg total) by mouth daily. 90 tablet 3  . budesonide-formoterol (SYMBICORT) 160-4.5 MCG/ACT inhaler Inhale 2 puffs into the lungs 2 (two) times daily. 1 Inhaler 3  . calcium carbonate (OS-CAL) 600 MG TABS tablet Take 600 mg by mouth daily.    . Cholecalciferol (VITAMIN D) 50 MCG (2000 UT) CAPS Take 2,000 Units by mouth daily.    Marland Kitchen denosumab (PROLIA) 60 MG/ML SOSY injection Inject 60 mg into the skin every 6 (six) months.    . diltiazem (CARDIZEM CD) 120 MG 24 hr capsule Take 1 capsule (120 mg total) by mouth daily. 90 capsule 3  . diltiazem (CARDIZEM) 60 MG tablet Take 1 tablet (60 mg total) by mouth 4 (four) times daily as needed. (Patient taking differently: Take 60 mg by mouth 4 (four) times daily as needed (Blood Pressure).) 30 tablet prn  . furosemide (LASIX) 40 MG tablet Take 1 tablet (40 mg total) by mouth daily. 90 tablet 3  . guaiFENesin (MUCINEX) 600 MG 12 hr tablet Take 1,200  mg by mouth 2 (two) times daily.    Marland Kitchen loratadine (CLARITIN) 10 MG tablet Take 10 mg by mouth in the morning.    . Melatonin 5 MG TABS Take 5 mg by mouth at bedtime.    . Multiple Vitamin (MULTIVITAMIN WITH MINERALS) TABS tablet Take 1 tablet by mouth daily.    Marland Kitchen omeprazole (PRILOSEC OTC) 20 MG tablet Take 20 mg by mouth 2 (two) times daily before a meal.    . OXYGEN Inhale 2-2.5 L/min into the lungs See admin instructions. Inhale 2 L/min of oxygen into the lungs at bedtime and when ambulatory    . sertraline (ZOLOFT) 25 MG tablet Take 1 tablet (25 mg total) by mouth at bedtime. 90 tablet 0  . sodium chloride  (OCEAN) 0.65 % nasal spray Place 1 spray into the nose as needed for congestion.    . Tiotropium Bromide Monohydrate (SPIRIVA RESPIMAT) 2.5 MCG/ACT AERS Inhale 2 puffs into the lungs daily. 4 g 0   No current facility-administered medications on file prior to visit.   Past Medical History:  Diagnosis Date  . A-fib ([place])   . Anemia   . Aortic atherosclerosis ([place])   . Atrial fibrillation ([place])   . Back pain   . COPD (chronic obstructive pulmonary disease) ([place])   . Diabetes mellitus without complication ([place])   . Elevated PSA   . GAD (generalized anxiety disorder)   . High cholesterol   . Hypertension   . Osteoporosis   . Oxygen deficiency    Past Surgical History:  Procedure Laterality Date  . CATARACT EXTRACTION Right   . SKIN SURGERY     shoulders and chest    Family History  Problem Relation Age of Onset  . Heart disease Brother   . Heart disease Mother   . Heart disease Father   . Cancer Paternal Uncle    Social History   Socioeconomic History  . Marital status: Married    Spouse name: Not on file  . Number of children: 3  . Years of education: Not on file  . Highest education level: Not on file  Occupational History  . Occupation: retired    Comment: truck Geophysicist/field seismologist  Tobacco Use  . Smoking status: Former Smoker    Packs/day: 2.00    Years: 52.00    Pack years: 104.00    Types: Cigarettes    Quit date: 02/03/2009    Years since quitting: 12.0  . Smokeless tobacco: Never Used  . Tobacco comment: Counseled to remain smoke free  Vaping Use  . Vaping Use: Never used  Substance and Sexual Activity  . Alcohol use: Not Currently    Alcohol/week: 0.0 standard drinks    Comment: occ  . Drug use: No  . Sexual activity: Not on file  Other Topics Concern  . Not on file  Social History Narrative   Originally from [place]. Previously worked driving a Restaurant manager, fast food. No pets currently. No mold exposure. 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       wears sunscreen, brushes and flosses daily, see's dentist bi-annually, has smoke/carbon monoxide detectors, wears a seatbelt and practices gun safety      Social Determinants of Health   Financial Resource Strain: Not on file  Food Insecurity: No Food Insecurity  . Worried About Charity fundraiser in the Last Year: Never true  . Ran Out of Food in  the Last Year: Never true  Transportation Needs: No Transportation Needs  . Lack of Transportation (Medical): No  . Lack of Transportation (Non-Medical): No  Physical Activity: Not on file  Stress: Not on file  Social Connections: Not on file    Review of Systems  Constitutional: Negative for chills and fatigue.  HENT: Negative for congestion, rhinorrhea and sore throat.   Respiratory: Positive for shortness of breath. Negative for cough.   Cardiovascular: Negative for chest pain.  Musculoskeletal: Positive for back pain (From coughing due to pneumonia).     Objective:  BP (!) 122/54   Pulse 96   Temp (!) 97.4 F (36.3 C)   Ht 5\' 6"  (1.676 m)   Wt 150 lb (68 kg)   SpO2 96% Comment: 2.5 L  BMI 24.21 kg/m   BP/Weight 02/02/2021 01/30/2021 9/76/7341  Systolic BP 937 - 902  Diastolic BP 77 - 54  Wt. (Lbs) - 149.91 150  BMI - 24.2 24.21    Physical Exam Constitutional:      Appearance: Normal appearance. He is normal weight.  Cardiovascular:     Rate and Rhythm: Normal rate. Rhythm irregular.     Heart sounds: Normal heart sounds.  Pulmonary:     Effort: No respiratory distress.     Breath sounds: Normal breath sounds. No wheezing.  Abdominal:     Palpations: Abdomen is soft.     Tenderness: There is no abdominal tenderness.  Neurological:     Mental Status: He is alert and oriented to person, place, and time.  Psychiatric:        Mood and Affect: Mood normal.        Behavior: Behavior normal.     Diabetic Foot Exam - Simple   No data filed      Lab Results   Component Value Date   WBC 10.7 (H) 01/30/2021   HGB 11.4 (L) 01/30/2021   HCT 36.8 (L) 01/30/2021   PLT 347 01/30/2021   GLUCOSE 136 (H) 01/30/2021   CHOL 172 11/20/2020   TRIG 62 12/25/2020   HDL 60 11/20/2020   LDLCALC 90 11/20/2020   ALT 31 01/30/2021   AST 23 01/30/2021   NA 140 01/30/2021   K 3.8 01/30/2021   CL 98 01/30/2021   CREATININE 0.68 01/30/2021   BUN 17 01/30/2021   CO2 30 01/30/2021   TSH 2.539 03/22/2019   INR 1.4 (H) 12/24/2020   HGBA1C 7.2 (H) 01/31/2021      Assessment & Plan:   1. COPD GOLD  III  Management per specialist (Dr. Melvyn Novas.) The current medical regimen is effective;  continue present plan and medications. Prednisone taper per direction of pulmonology.   2. Chronic atrial fibrillation (HCC) Continue eliquis, diltiazem,  and bisoprolol.   3. Chronic respiratory failure with hypoxia (HCC) Oxygen continuous 2 L at rest and 3 L with activity.   4. 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 Syncope and collapse Resolved.  Continue hydration.   Follow-up: Return in about 6 weeks (around 03/07/2021) for AWV.  An After Visit Summary was printed and given to the patient.  [name], MD [facility] [phone]

## 2021-01-23 ENCOUNTER — Ambulatory Visit: Payer: Self-pay

## 2021-01-23 ENCOUNTER — Telehealth: Payer: Self-pay

## 2021-01-23 NOTE — Telephone Encounter (Cosign Needed)
Patient approved with [place] and [place] for Symbicort 01/23/2021-12/22/2021. Medication will be shipped to patient's home in 7-12 days.   Sherre Poot, PharmD, [facility] Clinical Pharmacist [facility] [phone] (office) [phone] (mobile)

## 2021-01-26 ENCOUNTER — Telehealth: Payer: Self-pay

## 2021-01-26 ENCOUNTER — Other Ambulatory Visit: Payer: Medicare Other

## 2021-01-26 NOTE — Progress Notes (Signed)
Chronic Care Management Pharmacy Assistant   Name: Kyen Taite  MRN: [id number] DOB: Apr 23, 1946  Reason for Encounter: [facility] status update    PCP : [name], MD  Allergies:   Allergies  Allergen Reactions  . Daliresp [Roflumilast] Diarrhea and Nausea And Vomiting  . Alendronate Anxiety and Other (See Comments)    Jittery/nervous  . Levaquin [Levofloxacin] Palpitations and Other (See Comments)    Made the B/P fluctuate and heart raced    Medications: Outpatient Encounter Medications as of 01/26/2021  Medication Sig  . acetaminophen (TYLENOL) 325 MG tablet Take 2 tablets (650 mg total) by mouth every 6 (six) hours as needed for mild pain or headache (fever >/= 101).  Marland Kitchen albuterol (PROAIR HFA) 108 (90 Base) MCG/ACT inhaler Inhale 2 puffs into the lungs every 6 (six) hours as needed for wheezing or shortness of breath.   Marland Kitchen albuterol (PROVENTIL) (2.5 MG/3ML) 0.083% nebulizer solution Take 3 mLs (2.5 mg total) by nebulization in the morning and at bedtime. (Patient taking differently: Take 2.5 mg by nebulization 4 (four) times daily.)  . apixaban (ELIQUIS) 5 MG TABS tablet Take 1 tablet (5 mg total) by mouth 2 (two) times daily.  Marland Kitchen ascorbic acid (VITAMIN C) 500 MG tablet Take 1 tablet (500 mg total) by mouth daily. (Patient taking differently: Take 1,000 mg by mouth daily.)  . atorvastatin (LIPITOR) 40 MG tablet TAKE 1 TABLET(40 MG) BY MOUTH DAILY AFTER SUPPER (Patient taking differently: Take 40 mg by mouth at bedtime.)  . benzonatate (TESSALON) 100 MG capsule Take 100-200 mg by mouth 3 (three) times daily as needed for cough.  . bisoprolol (ZEBETA) 5 MG tablet Take 1 tablet (5 mg total) by mouth daily.  . budesonide-formoterol (SYMBICORT) 160-4.5 MCG/ACT inhaler Inhale 2 puffs into the lungs 2 (two) times daily.  . calcium carbonate (OS-CAL) 600 MG TABS tablet Take 600 mg by mouth daily.  . Cholecalciferol (VITAMIN D) 50 MCG (2000 UT) CAPS Take 2,000 Units by mouth daily.  Marland Kitchen  denosumab (PROLIA) 60 MG/ML SOSY injection Inject 60 mg into the skin every 6 (six) months.  . diltiazem (CARDIZEM CD) 120 MG 24 hr capsule Take 1 capsule (120 mg total) by mouth daily.  Marland Kitchen diltiazem (CARDIZEM) 60 MG tablet Take 1 tablet (60 mg total) by mouth 4 (four) times daily as needed.  . furosemide (LASIX) 40 MG tablet Take 1 tablet (40 mg total) by mouth daily.  Marland Kitchen guaiFENesin (MUCINEX) 600 MG 12 hr tablet Take 1,200 mg by mouth 2 (two) times daily.  Marland Kitchen loratadine (CLARITIN) 10 MG tablet Take 10 mg by mouth in the morning.  . Melatonin 5 MG TABS Take 5 mg by mouth at bedtime.  . Multiple Vitamin (MULTIVITAMIN WITH MINERALS) TABS tablet Take 1 tablet by mouth daily.  . niacinamide 500 MG tablet Take 500 mg by mouth daily.  Marland Kitchen omeprazole (PRILOSEC OTC) 20 MG tablet Take 40 mg by mouth 2 (two) times daily before a meal.  . OXYGEN Inhale 2 L/min into the lungs See admin instructions. Inhale 2 L/min of oxygen into the lungs at bedtime and when ambulatory  . predniSONE (DELTASONE) 10 MG tablet Take 4 tablets (40 mg total) by mouth daily with breakfast for 2 days, THEN 4 tablets (40 mg total) daily with breakfast for 3 days, THEN 3.5 tablets (35 mg total) daily with breakfast for 3 days, THEN 3.5 tablets (35 mg total) daily with breakfast for 3 days, THEN 3 tablets (30 mg total) daily  with breakfast for 3 days, THEN 3 tablets (30 mg total) daily with breakfast for 3 days, THEN 2.5 tablets (25 mg total) daily with breakfast for 3 days, THEN 2.5 tablets (25 mg total) daily with breakfast for 3 days, THEN 2 tablets (20 mg total) daily with breakfast for 3 days.  . predniSONE (DELTASONE) 5 MG tablet Take 20-40 mg by mouth See admin instructions. Take 40 mg by mouth with food daily for three days and decrease by 2.5 mg every three days until at a dose of 20 mg daily. Remain at 20 mg daily until follow-up appt with pulmonologist.  . sertraline (ZOLOFT) 25 MG tablet Take 1 tablet (25 mg total) by mouth at  bedtime.  . sodium chloride (OCEAN) 0.65 % nasal spray Place 1 spray into the nose as needed for congestion.  . Tiotropium Bromide Monohydrate (SPIRIVA RESPIMAT) 2.5 MCG/ACT AERS Inhale 2 puffs into the lungs daily.   No facility-administered encounter medications on file as of 01/26/2021.    Current Diagnosis: Patient Active Problem List   Diagnosis Date Noted  . Chronic respiratory failure with hypoxia ([place]) 01/16/2021  . Anxiety 01/14/2021  . Lingular pneumonia 01/05/2021  . COVID-19 12/24/2020  . COPD exacerbation ([place]) 12/09/2020  . COPD with acute exacerbation ([place]) 12/08/2020  . Physical deconditioning 04/12/2020  . Syncope 04/01/2020  . Chronic diastolic CHF (congestive heart failure) ([place]) 04/01/2020  . Osteoporosis with current pathological fracture 02/06/2020  . Essential hypertension 01/28/2020  . Chronic bilateral thoracic back pain 01/25/2020  . 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 HLD (hyperlipidemia) 01/05/2020  . Vertebral compression fracture ([place]) 05/29/2019  . Chronic anemia 05/29/2019  . Pulmonary nodules 05/29/2019  . Coronary artery calcification 04/06/2019  . Atherosclerosis of aorta ([place]) 04/06/2019  . Paroxysmal atrial fibrillation ([facility])   . Acute on chronic respiratory failure with hypoxia ([place])   . Lung nodule < 6cm on CT 05/29/2016  . Former cigarette smoker 12/05/2015  . COPD GOLD  III      Called [employer] at the request of Donette Larry, CPP to get a status update.   Per representative, the patient was over the income limit, and was denied, I asked about an appeal, I was told that income denials are final and no appeal is available.    Patient was approved for Symbicort, it will arrive in 7-10 days.  I will call patient and make them aware of status and to be looking for medication.  Patient asked about his Eliquis, I told him I did not have an update on that yet.    Follow-Up:  Patient Assistance Coordination and  Pharmacist Review  Donette Larry, CPP notified  Clarita Leber, [place] Pharmacist Assistant [phone]

## 2021-01-30 ENCOUNTER — Other Ambulatory Visit: Payer: Self-pay

## 2021-01-30 ENCOUNTER — Encounter: Payer: Medicare Other | Admitting: Family Medicine

## 2021-01-30 ENCOUNTER — Emergency Department (HOSPITAL_BASED_OUTPATIENT_CLINIC_OR_DEPARTMENT_OTHER): Payer: Medicare Other

## 2021-01-30 ENCOUNTER — Inpatient Hospital Stay (HOSPITAL_BASED_OUTPATIENT_CLINIC_OR_DEPARTMENT_OTHER)
Admission: EM | Admit: 2021-01-30 | Discharge: 2021-02-02 | DRG: 190 | Disposition: A | Discharge status: Home / Self Care | Payer: Medicare Other | Attending: Internal Medicine | Admitting: Internal Medicine

## 2021-01-30 ENCOUNTER — Encounter (HOSPITAL_BASED_OUTPATIENT_CLINIC_OR_DEPARTMENT_OTHER): Payer: Self-pay | Admitting: *Deleted

## 2021-01-30 ENCOUNTER — Inpatient Hospital Stay: Payer: Medicare Other | Admitting: Family Medicine

## 2021-01-30 DIAGNOSIS — Z8616 Personal history of COVID-19: Secondary | ICD-10-CM

## 2021-01-30 DIAGNOSIS — Z881 Allergy status to other antibiotic agents status: Secondary | ICD-10-CM

## 2021-01-30 DIAGNOSIS — U099 Post covid-19 condition, unspecified: Secondary | ICD-10-CM

## 2021-01-30 DIAGNOSIS — Z7902 Long term (current) use of antithrombotics/antiplatelets: Secondary | ICD-10-CM

## 2021-01-30 DIAGNOSIS — Z8249 Family history of ischemic heart disease and other diseases of the circulatory system: Secondary | ICD-10-CM

## 2021-01-30 DIAGNOSIS — E1169 Type 2 diabetes mellitus with other specified complication: Secondary | ICD-10-CM | POA: Diagnosis present

## 2021-01-30 DIAGNOSIS — J440 Chronic obstructive pulmonary disease with acute lower respiratory infection: Secondary | ICD-10-CM | POA: Diagnosis not present

## 2021-01-30 DIAGNOSIS — Z9981 Dependence on supplemental oxygen: Secondary | ICD-10-CM

## 2021-01-30 DIAGNOSIS — Z7952 Long term (current) use of systemic steroids: Secondary | ICD-10-CM

## 2021-01-30 DIAGNOSIS — J441 Chronic obstructive pulmonary disease with (acute) exacerbation: Secondary | ICD-10-CM | POA: Diagnosis not present

## 2021-01-30 DIAGNOSIS — R0600 Dyspnea, unspecified: Secondary | ICD-10-CM

## 2021-01-30 DIAGNOSIS — I10 Essential (primary) hypertension: Secondary | ICD-10-CM | POA: Diagnosis present

## 2021-01-30 DIAGNOSIS — I5032 Chronic diastolic (congestive) heart failure: Secondary | ICD-10-CM | POA: Diagnosis not present

## 2021-01-30 DIAGNOSIS — M810 Age-related osteoporosis without current pathological fracture: Secondary | ICD-10-CM | POA: Diagnosis present

## 2021-01-30 DIAGNOSIS — Z79899 Other long term (current) drug therapy: Secondary | ICD-10-CM

## 2021-01-30 DIAGNOSIS — J9621 Acute and chronic respiratory failure with hypoxia: Secondary | ICD-10-CM | POA: Diagnosis not present

## 2021-01-30 DIAGNOSIS — Z888 Allergy status to other drugs, medicaments and biological substances status: Secondary | ICD-10-CM

## 2021-01-30 DIAGNOSIS — E1159 Type 2 diabetes mellitus with other circulatory complications: Secondary | ICD-10-CM | POA: Diagnosis present

## 2021-01-30 DIAGNOSIS — F411 Generalized anxiety disorder: Secondary | ICD-10-CM | POA: Diagnosis present

## 2021-01-30 DIAGNOSIS — Z7951 Long term (current) use of inhaled steroids: Secondary | ICD-10-CM

## 2021-01-30 DIAGNOSIS — E782 Mixed hyperlipidemia: Secondary | ICD-10-CM | POA: Diagnosis present

## 2021-01-30 DIAGNOSIS — Z9841 Cataract extraction status, right eye: Secondary | ICD-10-CM

## 2021-01-30 DIAGNOSIS — I480 Paroxysmal atrial fibrillation: Secondary | ICD-10-CM | POA: Diagnosis present

## 2021-01-30 DIAGNOSIS — D649 Anemia, unspecified: Secondary | ICD-10-CM | POA: Diagnosis not present

## 2021-01-30 DIAGNOSIS — I110 Hypertensive heart disease with heart failure: Secondary | ICD-10-CM | POA: Diagnosis present

## 2021-01-30 DIAGNOSIS — Z87891 Personal history of nicotine dependence: Secondary | ICD-10-CM

## 2021-01-30 DIAGNOSIS — I700 Atherosclerosis of aorta: Secondary | ICD-10-CM | POA: Diagnosis present

## 2021-01-30 DIAGNOSIS — R0602 Shortness of breath: Secondary | ICD-10-CM | POA: Diagnosis not present

## 2021-01-30 LAB — COMPREHENSIVE METABOLIC PANEL
ALT: 31 U/L (ref 0–44)
AST: 23 U/L (ref 15–41)
Albumin: 3.4 g/dL — ABNORMAL LOW (ref 3.5–5.0)
Alkaline Phosphatase: 93 U/L (ref 38–126)
Anion gap: 12 (ref 5–15)
BUN: 17 mg/dL (ref 8–23)
CO2: 30 mmol/L (ref 22–32)
Calcium: 8.9 mg/dL (ref 8.9–10.3)
Chloride: 98 mmol/L (ref 98–111)
Creatinine, Ser: 0.68 mg/dL (ref 0.61–1.24)
GFR, Estimated: >60 mL/min (ref >60)
Glucose, Bld: 136 mg/dL — ABNORMAL HIGH (ref 70–99)
Potassium: 3.8 mmol/L (ref 3.5–5.1)
Sodium: 140 mmol/L (ref 135–145)
Total Bilirubin: 0.2 mg/dL — ABNORMAL LOW (ref 0.3–1.2)
Total Protein: 6.7 g/dL (ref 6.5–8.1)

## 2021-01-30 LAB — CBC
HCT: 36.8 % — ABNORMAL LOW (ref 39.0–52.0)
Hemoglobin: 11.4 g/dL — ABNORMAL LOW (ref 13.0–17.0)
MCH: 25.2 pg — ABNORMAL LOW (ref 26.0–34.0)
MCHC: 31 g/dL (ref 30.0–36.0)
MCV: 81.4 fL (ref 80.0–100.0)
Platelets: 347 10*3/uL (ref 150–400)
RBC: 4.52 MIL/uL (ref 4.22–5.81)
RDW: 17 % — ABNORMAL HIGH (ref 11.5–15.5)
WBC: 10.7 10*3/uL — ABNORMAL HIGH (ref 4.0–10.5)
nRBC: 0 % (ref 0.0–0.2)

## 2021-01-30 LAB — BRAIN NATRIURETIC PEPTIDE: B Natriuretic Peptide: 90.9 pg/mL (ref 0.0–100.0)

## 2021-01-30 LAB — TROPONIN I (HIGH SENSITIVITY): Troponin I (High Sensitivity): 5 ng/L (ref <18)

## 2021-01-30 MED ORDER — IPRATROPIUM BROMIDE HFA 17 MCG/ACT IN AERS
2.0000 | INHALATION_SPRAY | Freq: Once | RESPIRATORY_TRACT | Status: AC
  Administered 2021-01-30: 2 via RESPIRATORY_TRACT
  Filled 2021-01-30: qty 12.9

## 2021-01-30 MED ORDER — ALBUTEROL SULFATE HFA 108 (90 BASE) MCG/ACT IN AERS
4.0000 | INHALATION_SPRAY | Freq: Once | RESPIRATORY_TRACT | Status: AC
  Administered 2021-01-31: 4 via RESPIRATORY_TRACT

## 2021-01-30 MED ORDER — ALBUTEROL SULFATE HFA 108 (90 BASE) MCG/ACT IN AERS
4.0000 | INHALATION_SPRAY | Freq: Once | RESPIRATORY_TRACT | Status: AC
  Administered 2021-01-30: 4 via RESPIRATORY_TRACT
  Filled 2021-01-30: qty 6.7

## 2021-01-30 MED ORDER — ALBUTEROL (5 MG/ML) CONTINUOUS INHALATION SOLN
10.0000 mg/h | INHALATION_SOLUTION | RESPIRATORY_TRACT | Status: DC
  Administered 2021-01-30: 10 mg/h via RESPIRATORY_TRACT
  Filled 2021-01-30: qty 20

## 2021-01-30 MED ORDER — METHYLPREDNISOLONE SODIUM SUCC 125 MG IJ SOLR
125.0000 mg | Freq: Once | INTRAMUSCULAR | Status: AC
  Administered 2021-01-30: 125 mg via INTRAVENOUS
  Filled 2021-01-30: qty 2

## 2021-01-30 MED ORDER — IPRATROPIUM BROMIDE HFA 17 MCG/ACT IN AERS
2.0000 | INHALATION_SPRAY | Freq: Once | RESPIRATORY_TRACT | Status: AC
  Administered 2021-01-31: 2 via RESPIRATORY_TRACT

## 2021-01-30 NOTE — ED Triage Notes (Signed)
COPD worsening tonight.  Tachypnea. Speaking in complete sentences. Reports that he recently got discharged from the hospital with covid PNA. Denies fever, denies cough.

## 2021-01-30 NOTE — ED Provider Notes (Signed)
[name] Provider Note   CSN: [id number] Arrival date & time: 01/30/21  2109     History Chief Complaint  Patient presents with  . COPD    Paul [name] is a 75 y.o. male.  Patient w hx copd, c/o increased sob in the past 2-3 days. Symptoms acute onset, moderate, constant, persistent, worse w exertion. States hospitalized last month w covid, and then re-hospitalized w pna. Is on prednisone, slowly being tapered. Uses albuterol tx prn. Former smoking, none for years. Chest feels tight/constant which pt feels is copd, denies exertional, episodic, or pleuritic chest pain. No leg pain or swelling. No orthopnea. No fever or chills. States compliant w home meds.   The history is provided by the patient.  COPD Associated symptoms include shortness of breath. Pertinent negatives include no abdominal pain and no headaches.       Past Medical History:  Diagnosis Date  . A-fib ([place])   . Anemia   . Aortic atherosclerosis ([place])   . Atrial fibrillation ([place])   . Back pain   . COPD (chronic obstructive pulmonary disease) ([place])   . Diabetes mellitus without complication ([place])   . Elevated PSA   . GAD (generalized anxiety disorder)   . High cholesterol   . Hypertension   . Osteoporosis   . Oxygen deficiency     Patient Active Problem List   Diagnosis Date Noted  . Chronic respiratory failure with hypoxia ([place]) 01/16/2021  . Anxiety 01/14/2021  . Lingular pneumonia 01/05/2021  . COVID-19 12/24/2020  . COPD exacerbation ([place]) 12/09/2020  . COPD with acute exacerbation ([place]) 12/08/2020  . Physical deconditioning 04/12/2020  . Syncope 04/01/2020  . Chronic diastolic CHF (congestive heart failure) ([place]) 04/01/2020  . Osteoporosis with current pathological fracture 02/06/2020  . Essential hypertension 01/28/2020  . Chronic bilateral thoracic back pain 01/25/2020  . 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 HLD (hyperlipidemia)  01/05/2020  . Vertebral compression fracture ([place]) 05/29/2019  . Chronic anemia 05/29/2019  . Pulmonary nodules 05/29/2019  . Coronary artery calcification 04/06/2019  . Atherosclerosis of aorta ([place]) 04/06/2019  . Paroxysmal atrial fibrillation ([facility])   . Acute on chronic respiratory failure with hypoxia ([place])   . Lung nodule < 6cm on CT 05/29/2016  . Former cigarette smoker 12/05/2015  . COPD GOLD  III      Past Surgical History:  Procedure Laterality Date  . CATARACT EXTRACTION Right   . SKIN SURGERY     shoulders and chest       Family History  Problem Relation Age of Onset  . Heart disease Brother   . Heart disease Mother   . Heart disease Father   . Cancer Paternal Uncle     Social History   Tobacco Use  . Smoking status: Former Smoker    Packs/day: 2.00    Years: 52.00    Pack years: 104.00    Types: Cigarettes    Quit date: 02/03/2009    Years since quitting: 11.9  . Smokeless tobacco: Never Used  . Tobacco comment: Counseled to remain smoke free  Vaping Use  . Vaping Use: Never used  Substance Use Topics  . Alcohol use: Not Currently    Alcohol/week: 0.0 standard drinks    Comment: occ  . Drug use: No    Home Medications Prior to Admission medications   Medication Sig Start Date End Date Taking? Authorizing Provider  acetaminophen (TYLENOL) 325 MG tablet Take  2 tablets (650 mg total) by mouth every 6 (six) hours as needed for mild pain or headache (fever >/= 101). 12/28/20   Blain Pais, MD  albuterol (PROAIR HFA) 108 (90 Base) MCG/ACT inhaler Inhale 2 puffs into the lungs every 6 (six) hours as needed for wheezing or shortness of breath.     [provider]  albuterol (PROVENTIL) (2.5 MG/3ML) 0.083% nebulizer solution Take 3 mLs (2.5 mg total) by nebulization in the morning and at bedtime. Patient taking differently: Take 2.5 mg by nebulization 4 (four) times daily. 05/09/20   Parrett, Fonnie Mu, NP  apixaban (ELIQUIS) 5 MG TABS tablet  Take 1 tablet (5 mg total) by mouth 2 (two) times daily. 12/27/20   Jerline Pain, MD  ascorbic acid (VITAMIN C) 500 MG tablet Take 1 tablet (500 mg total) by mouth daily. Patient taking differently: Take 1,000 mg by mouth daily. 12/29/20   Blain Pais, MD  atorvastatin (LIPITOR) 40 MG tablet TAKE 1 TABLET(40 MG) BY MOUTH DAILY AFTER SUPPER Patient taking differently: Take 40 mg by mouth at bedtime. 12/27/19   Jerline Pain, MD  benzonatate (TESSALON) 100 MG capsule Take 100-200 mg by mouth 3 (three) times daily as needed for cough.    [provider]  bisoprolol (ZEBETA) 5 MG tablet Take 1 tablet (5 mg total) by mouth daily. 01/19/21   Jerline Pain, MD  budesonide-formoterol (SYMBICORT) 160-4.5 MCG/ACT inhaler Inhale 2 puffs into the lungs 2 (two) times daily. 07/25/20   Cox, Elnita Maxwell, MD  calcium carbonate (OS-CAL) 600 MG TABS tablet Take 600 mg by mouth daily.    [provider]  Cholecalciferol (VITAMIN D) 50 MCG (2000 UT) CAPS Take 2,000 Units by mouth daily.    [provider]  denosumab (PROLIA) 60 MG/ML SOSY injection Inject 60 mg into the skin every 6 (six) months.    [provider]  diltiazem (CARDIZEM CD) 120 MG 24 hr capsule Take 1 capsule (120 mg total) by mouth daily. 01/19/21   Jerline Pain, MD  diltiazem (CARDIZEM) 60 MG tablet Take 1 tablet (60 mg total) by mouth 4 (four) times daily as needed. 01/19/21   Jerline Pain, MD  furosemide (LASIX) 40 MG tablet Take 1 tablet (40 mg total) by mouth daily. 01/19/21   Jerline Pain, MD  guaiFENesin (MUCINEX) 600 MG 12 hr tablet Take 1,200 mg by mouth 2 (two) times daily.    [provider]  loratadine (CLARITIN) 10 MG tablet Take 10 mg by mouth in the morning.    [provider]  Melatonin 5 MG TABS Take 5 mg by mouth at bedtime.    [provider]  Multiple Vitamin (MULTIVITAMIN WITH MINERALS) TABS tablet Take 1 tablet by mouth daily.    [provider]   niacinamide 500 MG tablet Take 500 mg by mouth daily.    [provider]  omeprazole (PRILOSEC OTC) 20 MG tablet Take 40 mg by mouth 2 (two) times daily before a meal.    [provider]  OXYGEN Inhale 2 L/min into the lungs See admin instructions. Inhale 2 L/min of oxygen into the lungs at bedtime and when ambulatory    [provider]  predniSONE (DELTASONE) 10 MG tablet Take 4 tablets (40 mg total) by mouth daily with breakfast for 2 days, THEN 4 tablets (40 mg total) daily with breakfast for 3 days, THEN 3.5 tablets (35 mg total) daily with breakfast for 3 days,  THEN 3.5 tablets (35 mg total) daily with breakfast for 3 days, THEN 3 tablets (30 mg total) daily with breakfast for 3 days, THEN 3 tablets (30 mg total) daily with breakfast for 3 days, THEN 2.5 tablets (25 mg total) daily with breakfast for 3 days, THEN 2.5 tablets (25 mg total) daily with breakfast for 3 days, THEN 2 tablets (20 mg total) daily with breakfast for 3 days. 01/09/21 02/04/21  Harold Hedge, MD  predniSONE (DELTASONE) 5 MG tablet Take 20-40 mg by mouth See admin instructions. Take 40 mg by mouth with food daily for three days and decrease by 2.5 mg every three days until at a dose of 20 mg daily. Remain at 20 mg daily until follow-up appt with pulmonologist. 01/09/21   [provider]  sertraline (ZOLOFT) 25 MG tablet Take 1 tablet (25 mg total) by mouth at bedtime. 01/15/21 04/15/21  Kayleen Memos, DO  sodium chloride (OCEAN) 0.65 % nasal spray Place 1 spray into the nose as needed for congestion.    [provider]  Tiotropium Bromide Monohydrate (SPIRIVA RESPIMAT) 2.5 MCG/ACT AERS Inhale 2 puffs into the lungs daily. 10/03/20   Tanda Rockers, MD    Allergies    Daliresp [roflumilast], Alendronate, and Levaquin [levofloxacin]  Review of Systems   Review of Systems  Constitutional: Negative for chills and fever.  HENT: Negative for sore throat.   Eyes: Negative for redness.   Respiratory: Positive for cough, shortness of breath and wheezing.   Cardiovascular: Negative for leg swelling.  Gastrointestinal: Negative for abdominal pain, nausea and vomiting.  Genitourinary: Negative for flank pain.  Musculoskeletal: Negative for back pain and neck pain.  Skin: Negative for rash.  Neurological: Negative for headaches.  Hematological: Does not bruise/bleed easily.  Psychiatric/Behavioral: Negative for confusion.    Physical Exam Updated Vital Signs BP (!) 160/66 (BP Location: Left Arm)   Pulse (!) 102   Temp 98 F (36.7 C) (Oral)   Resp 18   Ht 1.676 m (5\' 6" )   Wt 68 kg   SpO2 99%   BMI 24.20 kg/m   Physical Exam Vitals and nursing note reviewed.  Constitutional:      Appearance: Normal appearance. He is well-developed.  HENT:     Head: Atraumatic.     Nose: Nose normal.     Mouth/Throat:     Mouth: Mucous membranes are moist.     Pharynx: Oropharynx is clear.  Eyes:     General: No scleral icterus.    Conjunctiva/sclera: Conjunctivae normal.  Neck:     Trachea: No tracheal deviation.  Cardiovascular:     Rate and Rhythm: Normal rate and regular rhythm.     Pulses: Normal pulses.     Heart sounds: Normal heart sounds. No murmur heard. No friction rub. No gallop.   Pulmonary:     Effort: Pulmonary effort is normal. No accessory muscle usage or respiratory distress.     Breath sounds: Wheezing present.  Abdominal:     General: Bowel sounds are normal. There is no distension.     Palpations: Abdomen is soft.     Tenderness: There is no abdominal tenderness.  Genitourinary:    Comments: No cva tenderness. Musculoskeletal:        General: No swelling or tenderness.     Cervical back: Normal range of motion and neck supple. No rigidity.     Right lower leg: No edema.     Left lower leg: No  edema.  Skin:    General: Skin is warm and dry.     Findings: No rash.  Neurological:     Mental Status: He is alert.     Comments: Alert, speech  clear.   Psychiatric:        Mood and Affect: Mood normal.     ED Results / Procedures / Treatments   Labs (all labs ordered are listed, but only abnormal results are displayed) Results for orders placed or performed during the hospital encounter of 01/30/21  CBC  Result Value Ref Range   WBC 10.7 (H) 4.0 - 10.5 K/uL   RBC 4.52 4.22 - 5.81 MIL/uL   Hemoglobin 11.4 (L) 13.0 - 17.0 g/dL   HCT 36.8 (L) 39.0 - 52.0 %   MCV 81.4 80.0 - 100.0 fL   MCH 25.2 (L) 26.0 - 34.0 pg   MCHC 31.0 30.0 - 36.0 g/dL   RDW 17.0 (H) 11.5 - 15.5 %   Platelets 347 150 - 400 K/uL   nRBC 0.0 0.0 - 0.2 %  Comprehensive metabolic panel  Result Value Ref Range   Sodium 140 135 - 145 mmol/L   Potassium 3.8 3.5 - 5.1 mmol/L   Chloride 98 98 - 111 mmol/L   CO2 30 22 - 32 mmol/L   Glucose, Bld 136 (H) 70 - 99 mg/dL   BUN 17 8 - 23 mg/dL   Creatinine, Ser 0.68 0.61 - 1.24 mg/dL   Calcium 8.9 8.9 - 10.3 mg/dL   Total Protein 6.7 6.5 - 8.1 g/dL   Albumin 3.4 (L) 3.5 - 5.0 g/dL   AST 23 15 - 41 U/L   ALT 31 0 - 44 U/L   Alkaline Phosphatase 93 38 - 126 U/L   Total Bilirubin 0.2 (L) 0.3 - 1.2 mg/dL   GFR, Estimated >60 >60 mL/min   Anion gap 12 5 - 15  Brain natriuretic peptide  Result Value Ref Range   B Natriuretic Peptide 90.9 0.0 - 100.0 pg/mL  Troponin I (High Sensitivity)  Result Value Ref Range   Troponin I (High Sensitivity) 5 <18 ng/L   DG Chest 2 View  Result Date: 01/05/2021 CLINICAL DATA:  Syncope and fall today.  Shortness of breath. EXAM: CHEST - 2 VIEW COMPARISON:  Single-view of the chest 12/24/2020 and 12/07/2020. CT chest 03/27/2020. FINDINGS: The lungs are emphysematous. There is a hazy focus of airspace opacity in the lingula which is new since the prior exams. The right lung is clear. No pneumothorax or pleural effusion. Heart size is normal. Aortic atherosclerosis. No acute or focal bony abnormality. IMPRESSION: Hazy airspace opacity in lingula worrisome for pneumonia. Aortic  Atherosclerosis (ICD10-I70.0) and Emphysema (ICD10-J43.9). Electronically Signed   By: Inge Rise M.D.   On: 01/05/2021 13:44   CT Head Wo Contrast  Result Date: 01/05/2021 CLINICAL DATA:  Syncope with fall. EXAM: CT HEAD WITHOUT CONTRAST CT CERVICAL SPINE WITHOUT CONTRAST TECHNIQUE: Multidetector CT imaging of the head and cervical spine was performed following the standard protocol without intravenous contrast. Multiplanar CT image reconstructions of the cervical spine were also generated. COMPARISON:  Head CT 03/27/2020. FINDINGS: CT HEAD FINDINGS Brain: There is no evidence for acute hemorrhage, hydrocephalus, mass lesion, or abnormal extra-axial fluid collection. No definite CT evidence for acute infarction. Diffuse loss of parenchymal volume is consistent with atrophy. Patchy low attenuation in the deep hemispheric and periventricular white matter is nonspecific, but likely reflects chronic microvascular ischemic demyelination. Vascular: No hyperdense vessel or unexpected  calcification. Skull: No evidence for fracture. No worrisome lytic or sclerotic lesion. Sinuses/Orbits: The visualized paranasal sinuses and mastoid air cells are clear. Visualized portions of the globes and intraorbital fat are unremarkable. Other: Soft tissue contusion noted left parietal scalp. CT CERVICAL SPINE FINDINGS Alignment: Straightening of normal cervical lordosis. No subluxation. Skull base and vertebrae: No acute fracture. No primary bone lesion or focal pathologic process. Soft tissues and spinal canal: No prevertebral fluid or swelling. No visible canal hematoma. Disc levels: Loss of disc height noted C2-3 and C5-6. Diffuse facet degeneration noted bilaterally. Upper chest: Centrilobular emphysema with calcific pleuroparenchymal scarring in both apices. Other: None. IMPRESSION: 1. No acute intracranial abnormality. Atrophy with chronic small vessel white matter ischemic disease. 2. Soft tissue contusion left parietal  scalp. 3. Degenerative changes in the cervical spine without fracture. 4. Loss of cervical lordosis. This can be related to patient positioning, muscle spasm or soft tissue injury. Electronically Signed   By: Misty Stanley M.D.   On: 01/05/2021 13:44   CT Cervical Spine Wo Contrast  Result Date: 01/05/2021 CLINICAL DATA:  Syncope with fall. EXAM: CT HEAD WITHOUT CONTRAST CT CERVICAL SPINE WITHOUT CONTRAST TECHNIQUE: Multidetector CT imaging of the head and cervical spine was performed following the standard protocol without intravenous contrast. Multiplanar CT image reconstructions of the cervical spine were also generated. COMPARISON:  Head CT 03/27/2020. FINDINGS: CT HEAD FINDINGS Brain: There is no evidence for acute hemorrhage, hydrocephalus, mass lesion, or abnormal extra-axial fluid collection. No definite CT evidence for acute infarction. Diffuse loss of parenchymal volume is consistent with atrophy. Patchy low attenuation in the deep hemispheric and periventricular white matter is nonspecific, but likely reflects chronic microvascular ischemic demyelination. Vascular: No hyperdense vessel or unexpected calcification. Skull: No evidence for fracture. No worrisome lytic or sclerotic lesion. Sinuses/Orbits: The visualized paranasal sinuses and mastoid air cells are clear. Visualized portions of the globes and intraorbital fat are unremarkable. Other: Soft tissue contusion noted left parietal scalp. CT CERVICAL SPINE FINDINGS Alignment: Straightening of normal cervical lordosis. No subluxation. Skull base and vertebrae: No acute fracture. No primary bone lesion or focal pathologic process. Soft tissues and spinal canal: No prevertebral fluid or swelling. No visible canal hematoma. Disc levels: Loss of disc height noted C2-3 and C5-6. Diffuse facet degeneration noted bilaterally. Upper chest: Centrilobular emphysema with calcific pleuroparenchymal scarring in both apices. Other: None. IMPRESSION: 1. No acute  intracranial abnormality. Atrophy with chronic small vessel white matter ischemic disease. 2. Soft tissue contusion left parietal scalp. 3. Degenerative changes in the cervical spine without fracture. 4. Loss of cervical lordosis. This can be related to patient positioning, muscle spasm or soft tissue injury. Electronically Signed   By: Misty Stanley M.D.   On: 01/05/2021 13:44   DG Chest Portable 1 View  Result Date: 01/14/2021 CLINICAL DATA:  75 year old male with history of shortness of breath and chest pain since yesterday evening. EXAM: PORTABLE CHEST 1 VIEW COMPARISON:  Chest x-ray 01/05/2021. FINDINGS: Lung volumes are normal. No consolidative airspace disease. No pleural effusions. No pneumothorax. Small calcified granulomas in the right upper lobe, similar to the prior study. No other pulmonary nodule or mass noted. Pulmonary vasculature and the cardiomediastinal silhouette are within normal limits. Atherosclerosis in the thoracic aorta. IMPRESSION: 1. No radiographic evidence of acute cardiopulmonary disease. 2. Aortic atherosclerosis. Electronically Signed   By: Vinnie Langton M.D.   On: 01/14/2021 10:45    EKG EKG Interpretation  Date/Time:  Tuesday January 30 2021 22:30:34 EST  Ventricular Rate:  98 PR Interval:    QRS Duration: 81 QT Interval:  345 QTC Calculation: 441 R Axis:   80 Text Interpretation: Sinus rhythm Borderline short PR interval Nonspecific ST abnormality No significant change since last tracing Confirmed by Lajean Saver [phone]) on 01/30/2021 10:34:05 PM   Radiology DG Chest Port 1 View  Result Date: 01/30/2021 CLINICAL DATA:  Shortness of breath, history of COPD worsening tonight. EXAM: PORTABLE CHEST 1 VIEW COMPARISON:  Chest radiograph January 14, 2021 FINDINGS: The heart size and mediastinal contours are within normal limits. Aortic atherosclerosis. Small calcified granulomas in the right lobe. Both lungs are clear. The visualized skeletal structures are  unremarkable. IMPRESSION: No acute cardiopulmonary disease. Electronically Signed   By: Dahlia Bailiff MD   On: 01/30/2021 22:32    Procedures Procedures   Medications Ordered in ED Medications  methylPREDNISolone sodium succinate (SOLU-MEDROL) 125 mg/2 mL injection 125 mg (has no administration in time range)  albuterol (VENTOLIN HFA) 108 (90 Base) MCG/ACT inhaler 4 puff (has no administration in time range)  ipratropium (ATROVENT HFA) inhaler 2 puff (has no administration in time range)    ED Course  I have reviewed the triage vital signs and the nursing notes.  Pertinent labs & imaging results that were available during my care of the patient were reviewed by me and considered in my medical decision making (see chart for details).    MDM Rules/Calculators/A&P                         Iv ns. Stat labs and imaging.   Reviewed nursing notes and prior charts for additional history.   Albuterol and atrovent tx. Solumedrol iv.   CXR reviewed/interpreted by me - no new or worsening pna.   Labs reviewed/interpreted by me - chem normal. Trop normal.   Recheck, persistent wheezing. Continuous alb neb.    20 min into neb, pt is feeling improved, wheezing persists, improved air movement.   2324, signed out to Dr Florina Ou to recheck post continuous neb, and disposition appropriately then.       Final Clinical Impression(s) / ED Diagnoses Final diagnoses:  None    Rx / DC Orders ED Discharge Orders    None       Lajean Saver, MD 01/30/21 2325

## 2021-01-31 ENCOUNTER — Encounter (HOSPITAL_COMMUNITY): Payer: Self-pay | Admitting: Internal Medicine

## 2021-01-31 DIAGNOSIS — Z9841 Cataract extraction status, right eye: Secondary | ICD-10-CM | POA: Diagnosis not present

## 2021-01-31 DIAGNOSIS — Z7952 Long term (current) use of systemic steroids: Secondary | ICD-10-CM | POA: Diagnosis not present

## 2021-01-31 DIAGNOSIS — I10 Essential (primary) hypertension: Secondary | ICD-10-CM | POA: Diagnosis not present

## 2021-01-31 DIAGNOSIS — Z7902 Long term (current) use of antithrombotics/antiplatelets: Secondary | ICD-10-CM | POA: Diagnosis not present

## 2021-01-31 DIAGNOSIS — I110 Hypertensive heart disease with heart failure: Secondary | ICD-10-CM | POA: Diagnosis present

## 2021-01-31 DIAGNOSIS — Z888 Allergy status to other drugs, medicaments and biological substances status: Secondary | ICD-10-CM | POA: Diagnosis not present

## 2021-01-31 DIAGNOSIS — Z7951 Long term (current) use of inhaled steroids: Secondary | ICD-10-CM | POA: Diagnosis not present

## 2021-01-31 DIAGNOSIS — Z87891 Personal history of nicotine dependence: Secondary | ICD-10-CM | POA: Diagnosis not present

## 2021-01-31 DIAGNOSIS — J441 Chronic obstructive pulmonary disease with (acute) exacerbation: Principal | ICD-10-CM

## 2021-01-31 DIAGNOSIS — E1169 Type 2 diabetes mellitus with other specified complication: Secondary | ICD-10-CM

## 2021-01-31 DIAGNOSIS — I480 Paroxysmal atrial fibrillation: Secondary | ICD-10-CM | POA: Diagnosis not present

## 2021-01-31 DIAGNOSIS — Z881 Allergy status to other antibiotic agents status: Secondary | ICD-10-CM | POA: Diagnosis not present

## 2021-01-31 DIAGNOSIS — E782 Mixed hyperlipidemia: Secondary | ICD-10-CM

## 2021-01-31 DIAGNOSIS — Z79899 Other long term (current) drug therapy: Secondary | ICD-10-CM | POA: Diagnosis not present

## 2021-01-31 DIAGNOSIS — Z8249 Family history of ischemic heart disease and other diseases of the circulatory system: Secondary | ICD-10-CM | POA: Diagnosis not present

## 2021-01-31 DIAGNOSIS — J440 Chronic obstructive pulmonary disease with acute lower respiratory infection: Secondary | ICD-10-CM | POA: Diagnosis present

## 2021-01-31 DIAGNOSIS — M810 Age-related osteoporosis without current pathological fracture: Secondary | ICD-10-CM | POA: Diagnosis present

## 2021-01-31 DIAGNOSIS — I700 Atherosclerosis of aorta: Secondary | ICD-10-CM | POA: Diagnosis present

## 2021-01-31 DIAGNOSIS — Z9981 Dependence on supplemental oxygen: Secondary | ICD-10-CM | POA: Diagnosis not present

## 2021-01-31 DIAGNOSIS — I5032 Chronic diastolic (congestive) heart failure: Secondary | ICD-10-CM | POA: Diagnosis present

## 2021-01-31 DIAGNOSIS — Z8616 Personal history of COVID-19: Secondary | ICD-10-CM | POA: Diagnosis not present

## 2021-01-31 DIAGNOSIS — J9621 Acute and chronic respiratory failure with hypoxia: Secondary | ICD-10-CM | POA: Diagnosis present

## 2021-01-31 DIAGNOSIS — F411 Generalized anxiety disorder: Secondary | ICD-10-CM | POA: Diagnosis present

## 2021-01-31 LAB — GLUCOSE, CAPILLARY
Glucose-Capillary: 151 mg/dL — ABNORMAL HIGH (ref 70–99)
Glucose-Capillary: 138 mg/dL — ABNORMAL HIGH (ref 70–99)

## 2021-01-31 LAB — HEMOGLOBIN A1C
Hgb A1c MFr Bld: 7.2 % — ABNORMAL HIGH (ref 4.8–5.6)
Mean Plasma Glucose: 159.94 mg/dL

## 2021-01-31 MED ORDER — ATORVASTATIN CALCIUM 40 MG PO TABS
40.0000 mg | ORAL_TABLET | Freq: Every day | ORAL | Status: DC
  Administered 2021-01-31 – 2021-02-01 (×2): 40 mg via ORAL
  Filled 2021-01-31 (×2): qty 1

## 2021-01-31 MED ORDER — UMECLIDINIUM BROMIDE 62.5 MCG/INH IN AEPB
1.0000 | INHALATION_SPRAY | Freq: Every day | RESPIRATORY_TRACT | Status: DC
  Administered 2021-01-31 – 2021-02-02 (×3): 1 via RESPIRATORY_TRACT
  Filled 2021-01-31: qty 7

## 2021-01-31 MED ORDER — ALBUTEROL SULFATE (2.5 MG/3ML) 0.083% IN NEBU
2.5000 mg | INHALATION_SOLUTION | RESPIRATORY_TRACT | Status: DC | PRN
  Administered 2021-01-31 – 2021-02-01 (×2): 2.5 mg via RESPIRATORY_TRACT
  Filled 2021-01-31 (×2): qty 3

## 2021-01-31 MED ORDER — BISOPROLOL FUMARATE 5 MG PO TABS
5.0000 mg | ORAL_TABLET | Freq: Every day | ORAL | Status: DC
  Administered 2021-01-31 – 2021-02-02 (×3): 5 mg via ORAL
  Filled 2021-01-31 (×3): qty 1

## 2021-01-31 MED ORDER — DILTIAZEM HCL ER COATED BEADS 120 MG PO CP24
120.0000 mg | ORAL_CAPSULE | Freq: Every day | ORAL | Status: DC
  Administered 2021-01-31 – 2021-02-02 (×3): 120 mg via ORAL
  Filled 2021-01-31 (×3): qty 1

## 2021-01-31 MED ORDER — MELATONIN 5 MG PO TABS
5.0000 mg | ORAL_TABLET | Freq: Every day | ORAL | Status: DC
  Administered 2021-01-31 – 2021-02-01 (×2): 5 mg via ORAL
  Filled 2021-01-31 (×3): qty 1

## 2021-01-31 MED ORDER — SERTRALINE HCL 25 MG PO TABS
25.0000 mg | ORAL_TABLET | Freq: Every day | ORAL | Status: DC
  Administered 2021-01-31 – 2021-02-01 (×2): 25 mg via ORAL
  Filled 2021-01-31 (×2): qty 1

## 2021-01-31 MED ORDER — METHYLPREDNISOLONE SODIUM SUCC 40 MG IJ SOLR
40.0000 mg | Freq: Two times a day (BID) | INTRAMUSCULAR | Status: DC
  Administered 2021-01-31 – 2021-02-02 (×4): 40 mg via INTRAVENOUS
  Filled 2021-01-31 (×5): qty 1

## 2021-01-31 MED ORDER — ACETAMINOPHEN 650 MG RE SUPP: 650.0000 mg | Freq: Four times a day (QID) | RECTAL | Status: DC | PRN

## 2021-01-31 MED ORDER — DOXYCYCLINE HYCLATE 100 MG PO TABS
100.0000 mg | ORAL_TABLET | Freq: Two times a day (BID) | ORAL | Status: DC
  Administered 2021-01-31 – 2021-02-02 (×5): 100 mg via ORAL
  Filled 2021-01-31 (×5): qty 1

## 2021-01-31 MED ORDER — METHYLPREDNISOLONE SODIUM SUCC 125 MG IJ SOLR
125.0000 mg | Freq: Three times a day (TID) | INTRAMUSCULAR | Status: DC
  Administered 2021-01-31: 125 mg via INTRAVENOUS
  Filled 2021-01-31: qty 2

## 2021-01-31 MED ORDER — FUROSEMIDE 40 MG PO TABS
40.0000 mg | ORAL_TABLET | Freq: Every day | ORAL | Status: DC
  Administered 2021-01-31 – 2021-02-02 (×3): 40 mg via ORAL
  Filled 2021-01-31 (×3): qty 1

## 2021-01-31 MED ORDER — FLUTICASONE FUROATE-VILANTEROL 200-25 MCG/INH IN AEPB
1.0000 | INHALATION_SPRAY | Freq: Every day | RESPIRATORY_TRACT | Status: DC
  Administered 2021-01-31: 1 via RESPIRATORY_TRACT
  Filled 2021-01-31: qty 28

## 2021-01-31 MED ORDER — IPRATROPIUM-ALBUTEROL 20-100 MCG/ACT IN AERS
1.0000 | INHALATION_SPRAY | Freq: Four times a day (QID) | RESPIRATORY_TRACT | Status: DC
  Administered 2021-02-01 (×3): 1 via RESPIRATORY_TRACT
  Filled 2021-01-31: qty 4

## 2021-01-31 MED ORDER — ALBUTEROL SULFATE HFA 108 (90 BASE) MCG/ACT IN AERS: 2.0000 | INHALATION_SPRAY | RESPIRATORY_TRACT | Status: DC | PRN

## 2021-01-31 MED ORDER — APIXABAN 5 MG PO TABS
5.0000 mg | ORAL_TABLET | Freq: Two times a day (BID) | ORAL | Status: DC
  Administered 2021-01-31 – 2021-02-02 (×5): 5 mg via ORAL
  Filled 2021-01-31 (×5): qty 1

## 2021-01-31 MED ORDER — LACTATED RINGERS IV SOLN: INTRAVENOUS | Status: DC

## 2021-01-31 MED ORDER — TIOTROPIUM BROMIDE MONOHYDRATE 2.5 MCG/ACT IN AERS: 2.0000 | INHALATION_SPRAY | Freq: Every day | RESPIRATORY_TRACT | Status: DC

## 2021-01-31 MED ORDER — INSULIN ASPART 100 UNIT/ML ~~LOC~~ SOLN
0.0000 [IU] | Freq: Three times a day (TID) | SUBCUTANEOUS | Status: DC
  Administered 2021-01-31: 2 [IU] via SUBCUTANEOUS
  Administered 2021-02-01 – 2021-02-02 (×2): 1 [IU] via SUBCUTANEOUS

## 2021-01-31 MED ORDER — ACETAMINOPHEN 325 MG PO TABS
650.0000 mg | ORAL_TABLET | Freq: Four times a day (QID) | ORAL | Status: DC | PRN
  Administered 2021-02-01: 650 mg via ORAL
  Filled 2021-01-31: qty 2

## 2021-01-31 MED ORDER — IPRATROPIUM-ALBUTEROL 0.5-2.5 (3) MG/3ML IN SOLN
3.0000 mL | Freq: Four times a day (QID) | RESPIRATORY_TRACT | Status: DC
  Administered 2021-01-31: 3 mL via RESPIRATORY_TRACT
  Filled 2021-01-31: qty 3

## 2021-01-31 NOTE — Progress Notes (Signed)
   Pt is active with [facility] of [facility].  Will plan to resume SN and MSW support of pt when he returns home.  Please call [facility] if we can be of assistance with the d/c plan.  Thank you Wynetta Fines Office [phone] Mobile [phone]

## 2021-01-31 NOTE — H&P (Signed)
History and Physical    Paul [name] NLZ:[id number] DOB: 1946-07-15 DOA: 01/30/2021  PCP: [name], MD   Patient coming from: Home  Chief Complaint:   SOB  HPI: Paul [name] is a 75 y.o. male with medical history significant for COPD and chronic respiratory failure on 2 L home oxygen, paroxysmal A. fib, diabetes, hypertension presenting to the ED for evaluation of shortness of breath. Patient has had multiple recent hospitalizations: 1/2-1/6 for covid pneumonia, 1/14-1/18 for bacterial pneumonia and COPD exacerbation, 1/23-1/24 for COPD exacerbation. He began having increased SOB two days ago and used his nebulizer at home with no relief. He generally uses oxygen at night but had to use during the day for past two days due to SOB. No fever or chills.  In the ED, patient is requiring 2 L supplemental oxygen continuously.  BNP normal.  Troponin negative.  Chest x-ray without acute findings.  Wheezing on exam and patient was given albuterol and ipratropium inhaler treatments, IV Solu-Medrol 125 mg, and continuous albuterol nebulizer treatment.  Patient is not endorsing any symptoms consistent with ongoing Covid infection at this time. He reports a normal appetite and change in his taste or smell.    Review of Systems:  General: Denies fever, chills, weight loss, night sweats.  Denies dizziness.  Denies change in appetite HENT: Denies head trauma, headache, denies change in hearing, tinnitus.  Denies nasal congestion or bleeding.  Denies sore throat, sores in mouth.  Denies difficulty swallowing Eyes: Denies blurry vision, pain in eye, drainage.  Denies discoloration of eyes. Neck: Denies pain.  Denies swelling.  Denies pain with movement. Cardiovascular: Denies chest pain, palpitations.  Denies edema.  Denies orthopnea Respiratory: Reports shortness of breath with cough. Reports wheezing.  Has thick sputum production Gastrointestinal: Denies abdominal pain, swelling.  Denies nausea, vomiting,  diarrhea.  Denies melena.  Denies hematemesis. Musculoskeletal: Denies limitation of movement.  Denies deformity or swelling. Denies arthralgias or myalgias. Genitourinary: Denies pelvic pain.  Denies urinary frequency or hesitancy.  Denies dysuria.  Skin: Denies rash.  Denies petechiae, purpura, ecchymosis. Neurological: Denies headache.  Denies syncope.  Denies seizure activity.  Denies paresthesia.  Denies slurred speech, drooping face.  Denies visual change. Psychiatric: Denies depression, anxiety.  Denies hallucinations.  Past Medical History:  Diagnosis Date  . A-fib ([place])   . Anemia   . Aortic atherosclerosis ([place])   . Atrial fibrillation ([place])   . Back pain   . COPD (chronic obstructive pulmonary disease) ([place])   . Diabetes mellitus without complication ([place])   . Elevated PSA   . GAD (generalized anxiety disorder)   . High cholesterol   . Hypertension   . Osteoporosis   . Oxygen deficiency     Past Surgical History:  Procedure Laterality Date  . CATARACT EXTRACTION Right   . SKIN SURGERY     shoulders and chest    Social History  reports that he quit smoking about 12 years ago. His smoking use included cigarettes. He has a 104.00 pack-year smoking history. He has never used smokeless tobacco. He reports previous alcohol use. He reports that he does not use drugs.  Allergies  Allergen Reactions  . Daliresp [Roflumilast] Diarrhea and Nausea And Vomiting  . Alendronate Anxiety and Other (See Comments)    Jittery/nervous  . Levaquin [Levofloxacin] Palpitations and Other (See Comments)    Made the B/P fluctuate and heart raced    Family History  Problem Relation Age of Onset  . Heart disease Brother   .  Heart disease Mother   . Heart disease Father   . Cancer Paternal Uncle      Prior to Admission medications   Medication Sig Start Date End Date Taking? Authorizing Provider  acetaminophen (TYLENOL) 325 MG tablet Take 2 tablets (650 mg total) by mouth every 6  (six) hours as needed for mild pain or headache (fever >/= 101). 12/28/20   Blain Pais, MD  albuterol (PROAIR HFA) 108 (90 Base) MCG/ACT inhaler Inhale 2 puffs into the lungs every 6 (six) hours as needed for wheezing or shortness of breath.     [provider]  albuterol (PROVENTIL) (2.5 MG/3ML) 0.083% nebulizer solution Take 3 mLs (2.5 mg total) by nebulization in the morning and at bedtime. Patient taking differently: Take 2.5 mg by nebulization 4 (four) times daily. 05/09/20   Parrett, Fonnie Mu, NP  apixaban (ELIQUIS) 5 MG TABS tablet Take 1 tablet (5 mg total) by mouth 2 (two) times daily. 12/27/20   Jerline Pain, MD  ascorbic acid (VITAMIN C) 500 MG tablet Take 1 tablet (500 mg total) by mouth daily. Patient taking differently: Take 1,000 mg by mouth daily. 12/29/20   Blain Pais, MD  atorvastatin (LIPITOR) 40 MG tablet TAKE 1 TABLET(40 MG) BY MOUTH DAILY AFTER SUPPER Patient taking differently: Take 40 mg by mouth at bedtime. 12/27/19   Jerline Pain, MD  benzonatate (TESSALON) 100 MG capsule Take 100-200 mg by mouth 3 (three) times daily as needed for cough.    [provider]  bisoprolol (ZEBETA) 5 MG tablet Take 1 tablet (5 mg total) by mouth daily. 01/19/21   Jerline Pain, MD  budesonide-formoterol (SYMBICORT) 160-4.5 MCG/ACT inhaler Inhale 2 puffs into the lungs 2 (two) times daily. 07/25/20   Cox, Elnita Maxwell, MD  calcium carbonate (OS-CAL) 600 MG TABS tablet Take 600 mg by mouth daily.    [provider]  Cholecalciferol (VITAMIN D) 50 MCG (2000 UT) CAPS Take 2,000 Units by mouth daily.    [provider]  denosumab (PROLIA) 60 MG/ML SOSY injection Inject 60 mg into the skin every 6 (six) months.    [provider]  diltiazem (CARDIZEM CD) 120 MG 24 hr capsule Take 1 capsule (120 mg total) by mouth daily. 01/19/21   Jerline Pain, MD  diltiazem (CARDIZEM) 60 MG tablet Take 1 tablet (60 mg total) by mouth 4 (four) times daily as needed.  01/19/21   Jerline Pain, MD  furosemide (LASIX) 40 MG tablet Take 1 tablet (40 mg total) by mouth daily. 01/19/21   Jerline Pain, MD  guaiFENesin (MUCINEX) 600 MG 12 hr tablet Take 1,200 mg by mouth 2 (two) times daily.    [provider]  loratadine (CLARITIN) 10 MG tablet Take 10 mg by mouth in the morning.    [provider]  Melatonin 5 MG TABS Take 5 mg by mouth at bedtime.    [provider]  Multiple Vitamin (MULTIVITAMIN WITH MINERALS) TABS tablet Take 1 tablet by mouth daily.    [provider]  niacinamide 500 MG tablet Take 500 mg by mouth daily.    [provider]  omeprazole (PRILOSEC OTC) 20 MG tablet Take 40 mg by mouth 2 (two) times daily before a meal.    [provider]  OXYGEN Inhale 2 L/min into the lungs See admin instructions. Inhale 2 L/min of oxygen into the lungs at bedtime and when ambulatory    [provider]  predniSONE (  DELTASONE) 10 MG tablet Take 4 tablets (40 mg total) by mouth daily with breakfast for 2 days, THEN 4 tablets (40 mg total) daily with breakfast for 3 days, THEN 3.5 tablets (35 mg total) daily with breakfast for 3 days, THEN 3.5 tablets (35 mg total) daily with breakfast for 3 days, THEN 3 tablets (30 mg total) daily with breakfast for 3 days, THEN 3 tablets (30 mg total) daily with breakfast for 3 days, THEN 2.5 tablets (25 mg total) daily with breakfast for 3 days, THEN 2.5 tablets (25 mg total) daily with breakfast for 3 days, THEN 2 tablets (20 mg total) daily with breakfast for 3 days. 01/09/21 02/04/21  Harold Hedge, MD  predniSONE (DELTASONE) 5 MG tablet Take 20-40 mg by mouth See admin instructions. Take 40 mg by mouth with food daily for three days and decrease by 2.5 mg every three days until at a dose of 20 mg daily. Remain at 20 mg daily until follow-up appt with pulmonologist. 01/09/21   [provider]  sertraline (ZOLOFT) 25 MG tablet Take 1 tablet (25 mg total) by mouth  at bedtime. 01/15/21 04/15/21  Kayleen Memos, DO  sodium chloride (OCEAN) 0.65 % nasal spray Place 1 spray into the nose as needed for congestion.    [provider]  Tiotropium Bromide Monohydrate (SPIRIVA RESPIMAT) 2.5 MCG/ACT AERS Inhale 2 puffs into the lungs daily. 10/03/20   Tanda Rockers, MD    Physical Exam: Vitals:   01/31/21 0100 01/31/21 0200 01/31/21 0205 01/31/21 0334  BP: (!) 148/81 (!) 162/79  (!) 135/57  Pulse: (!) 120 (!) 117  (!) 122  Resp: 20 (!) 21  20  Temp:    97.9 F (36.6 C)  TempSrc:    Oral  SpO2: 98% 92% 99% 99%  Weight:      Height:        Constitutional: NAD, calm, comfortable Vitals:   01/31/21 0100 01/31/21 0200 01/31/21 0205 01/31/21 0334  BP: (!) 148/81 (!) 162/79  (!) 135/57  Pulse: (!) 120 (!) 117  (!) 122  Resp: 20 (!) 21  20  Temp:    97.9 F (36.6 C)  TempSrc:    Oral  SpO2: 98% 92% 99% 99%  Weight:      Height:       General: WDWN, Alert and oriented x3.  Eyes: EOMI, PERRL, conjunctivae normal.  Sclera nonicteric HENT:  [place]/AT, external ears normal.  Nares patent without epistasis.  Mucous membranes are moist.  Neck: Soft, normal range of motion, supple, no masses, no thyromegaly.  Trachea midline Respiratory: Equal breath sounds with diffuse rales and expiratory wheezing, no crackles. Normal respiratory effort. No accessory muscle use.  Cardiovascular: Regular rhythm, tachycardia. no murmurs / rubs / gallops. No extremity edema. Abdomen: Soft, no tenderness, nondistended, no rebound or guarding.  No masses palpated. Bowel sounds normoactive Musculoskeletal: FROM. no cyanosis. No joint deformity upper and lower extremities. Normal muscle tone.  Skin: Warm, dry, intact no rashes, lesions, ulcers. No induration Neurologic: CN 2-12 grossly intact.  Normal speech.  Sensation intact. Strength 5/5 in all extremities.   Psychiatric: Normal judgment and insight.  Normal mood.    Labs on Admission: I have personally reviewed  following labs and imaging studies  CBC: Recent Labs  Lab 01/30/21 2122  WBC 10.7*  HGB 11.4*  HCT 36.8*  MCV 81.4  PLT 517    Basic Metabolic Panel: Recent Labs  Lab 01/30/21 2122  NA  140  K 3.8  CL 98  CO2 30  GLUCOSE 136*  BUN 17  CREATININE 0.68  CALCIUM 8.9    GFR: Estimated Creatinine Clearance: 72 mL/min (by C-G formula based on SCr of 0.68 mg/dL).  Liver Function Tests: Recent Labs  Lab 01/30/21 2122  AST 23  ALT 31  ALKPHOS 93  BILITOT 0.2*  PROT 6.7  ALBUMIN 3.4*    Urine analysis:    Component Value Date/Time   COLORURINE YELLOW 01/14/2021 1100   APPEARANCEUR CLEAR 01/14/2021 1100   LABSPEC 1.015 01/14/2021 1100   PHURINE 7.5 01/14/2021 1100   GLUCOSEU NEGATIVE 01/14/2021 1100   HGBUR MODERATE (A) 01/14/2021 1100   BILIRUBINUR NEGATIVE 01/14/2021 1100   KETONESUR NEGATIVE 01/14/2021 1100   PROTEINUR NEGATIVE 01/14/2021 1100   NITRITE NEGATIVE 01/14/2021 1100   LEUKOCYTESUR NEGATIVE 01/14/2021 1100    Radiological Exams on Admission: DG Chest Port 1 View  Result Date: 01/30/2021 CLINICAL DATA:  Shortness of breath, history of COPD worsening tonight. EXAM: PORTABLE CHEST 1 VIEW COMPARISON:  Chest radiograph January 14, 2021 FINDINGS: The heart size and mediastinal contours are within normal limits. Aortic atherosclerosis. Small calcified granulomas in the right lobe. Both lungs are clear. The visualized skeletal structures are unremarkable. IMPRESSION: No acute cardiopulmonary disease. Electronically Signed   By: Dahlia Bailiff MD   On: 01/30/2021 22:32    EKG: Independently reviewed.  EKG shows normal sinus rhythm with nonspecific ST changes but no acute ST elevation or depression.  QTc 441  Assessment/Plan Principal Problem:   COPD exacerbation  [place] is admitted to medical telemetry floor.  Exacerbation worsening shortness of breath over the last few days.  Chest x-ray is negative.  He does not have any fever.  He has a thickened but  nonpurulent sputum intermittently with coughing.  ER checked him for Covid and it was positive today but this is probably secondary to his previous Covid pneumonia on January 2 and patient does not need to be in isolation at this time. Treat with doxycycline. Supplemental oxygen as needed to keep O2 sat between 92 to 96%. Incentive spirometer every 1-2 hours while awake. Breo Ellipta in place of Symbicort while in the hospital.  Continue Spiriva. Continue albuterol every 4 hours as needed  Active Problems:   Paroxysmal atrial fibrillation Chronic.  Patient is anticoagulated on Eliquis which will be continued.    Essential hypertension Continue home antihypertensive medications.  Monitor blood pressure.    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[place]) Recent control with diet.  Blood sugar be rechecked in the morning with labs.  Continue statin   DVT prophylaxis: Is anticoagulated on Eliquis which is continued. Code Status:   Full Code  Family Communication:  Diagnosis and plan discussed with patient.  Patient verbalized understanding agrees with plan.  Further recommendations to follow as clinically indicated Disposition Plan:   Patient is from:  Home  Anticipated DC to:  Home  Anticipated DC date:  Anticipate 2 midnight or more stay in the hospital to treat acute condition  Anticipated DC barriers: No barriers to discharge identified at this time  Admission status:  Inpatient  Yevonne Aline Kourtnie Sachs MD [facility]  How to contact the [facility] Attending or Consulting provider [place] or covering provider during after hours [place], for this patient?   1. Check the care team in [facility] and look for a) attending/consulting [facility] provider listed and b) the [facility] team listed 2. Log into [email or web address] and  use [place]'s universal password to access. If you do not have the password, please contact the hospital operator. 3. Locate the [facility] provider you are looking for under [facility]  and page to a number that you can be directly reached. 4. If you still have difficulty reaching the provider, please page the [facility] (Director on Call) for the Hospitalists listed on amion for assistance.  01/31/2021, 4:20 AM

## 2021-01-31 NOTE — Plan of Care (Signed)
Admission requested from [facility] ED.  Spoke to Dr. Florina Ou.  Patient is a 75 year old male with a history of COPD and chronic respiratory failure on 2 L home oxygen, paroxysmal A. fib, diabetes, hypertension presenting to the ED for evaluation of shortness of breath.  Patient has had multiple recent hospitalizations: 1/2-1/6 for covid pneumonia, 1/14-1/18 for bacterial pneumonia and COPD exacerbation, 1/23-1/24 for COPD exacerbation.  In the ED, patient is requiring 2 L supplemental oxygen continuously.  BNP normal.  Troponin negative.  Chest x-ray without acute findings.  Wheezing on exam and patient was given albuterol and ipratropium inhaler treatments, IV Solu-Medrol 125 mg, and continuous albuterol nebulizer treatment.  Patient is not endorsing any symptoms consistent with ongoing Covid infection at this time.  Bed request placed.  TRH will assume care on arrival to accepting facility. Until arrival, care as per EDP. However, [facility] available 24/7 for questions and assistance.

## 2021-01-31 NOTE — TOC Initial Note (Signed)
Transition of Care [facility]) - Initial/Assessment Note    Patient Details  Name: Paul [name] MRN: [id number] Date of Birth: 12-03-1946  Transition of Care [facility]) CM/SW Contact:    Carles Collet, RN Phone Number: 01/31/2021, 4:03 PM  Clinical Narrative:                Damaris Schooner w patient on the phone. He states that he is from home w his wife. He has home oxygen through [employer]. His wife will be able to bring a tank with her when she comes to get him at DC. No CM needs identified at this time.    Expected Discharge Plan: Home/Self Care     Patient Goals and CMS Choice        Expected Discharge Plan and Services Expected Discharge Plan: Home/Self Care                                              Prior Living Arrangements/Services                       Activities of Daily Living Home Assistive Devices/Equipment: None ADL Screening (condition at time of admission) Patient's cognitive ability adequate to safely complete daily activities?: Yes Is the patient deaf or have difficulty hearing?: No Does the patient have difficulty seeing, even when wearing glasses/contacts?: No Does the patient have difficulty concentrating, remembering, or making decisions?: No Patient able to express need for assistance with ADLs?: No Does the patient have difficulty dressing or bathing?: No Independently performs ADLs?: Yes (appropriate for developmental age) Does the patient have difficulty walking or climbing stairs?: No Weakness of Legs: None Weakness of Arms/Hands: None  Permission Sought/Granted                  Emotional Assessment              Admission diagnosis:  COPD exacerbation ([place]) [J44.1] Persistent dyspnea after COVID-19 [R06.00, B94.8] Patient Active Problem List   Diagnosis Date Noted  . Chronic respiratory failure with hypoxia ([place]) 01/16/2021  . Anxiety 01/14/2021  . Lingular pneumonia 01/05/2021  . COVID-19 12/24/2020  . COPD exacerbation ([place])  12/09/2020  . COPD with acute exacerbation ([place]) 12/08/2020  . Physical deconditioning 04/12/2020  . Syncope 04/01/2020  . Chronic diastolic CHF (congestive heart failure) ([place]) 04/01/2020  . Osteoporosis with current pathological fracture 02/06/2020  . Essential hypertension 01/28/2020  . Chronic bilateral thoracic back pain 01/25/2020  . 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 HLD (hyperlipidemia) 01/05/2020  . Vertebral compression fracture ([place]) 05/29/2019  . Chronic anemia 05/29/2019  . Pulmonary nodules 05/29/2019  . Coronary artery calcification 04/06/2019  . Atherosclerosis of aorta ([place]) 04/06/2019  . Paroxysmal atrial fibrillation (H[facility])   . Acute on chronic respiratory failure with hypoxia ([place])   . Lung nodule < 6cm on CT 05/29/2016  . Former cigarette smoker 12/05/2015  . COPD GOLD  III     PCP:  [name], MD Pharmacy:   [facility] [address] [place] [address] Phone: [phone] Fax: [phone]     Social Determinants of Health (SDOH) Interventions    Readmission Risk Interventions Readmission Risk Prevention Plan 01/15/2021 04/05/2020 06/01/2019  Transportation Screening Complete Complete Complete  PCP or Specialist Appt within 3-5  Days - - Not Complete  Not Complete comments - - not ready for discharge   [place] or [place] - - Complete  Social Work Consult for [place] Planning/Counseling - - Complete  Palliative Care Screening - - Not Applicable  Medication Review (RN Care Manager) Complete Complete Complete  PCP or Specialist appointment within 3-5 days of discharge Complete Complete -  [place] or Home Care Consult Complete Complete -  SW Recovery Care/Counseling Consult Complete Complete -  Palliative Care Screening Not Applicable Complete -  [place] Not Applicable Not Applicable -  Some recent data might be hidden

## 2021-01-31 NOTE — Progress Notes (Signed)
PROGRESS NOTE        PATIENT DETAILS Name: Paul [name] Age: 75 y.o. Sex: male Date of Birth: Jul 05, 1946 Admit Date: 01/30/2021 Admitting Physician Eben Burow, MD PCP:Cox, Elnita Maxwell, MD  Brief Narrative: Patient is a 75 y.o. 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 fib on Eliquis-recent hospitalizations-presenting with shortness of breath-thought to have acute hypoxic respiratory failure due to COVID-19 pneumonia.  Significant events: 2/8>> admit for hypoxia due to COPD exacerbation 1/23-1/24>> hospitalized-COPD exacerbation 1/14-1/18>> hospitalized-hypoxia due to COVID-19 pneumonia  Significant studies: 2/8>> chest x-ray: No pneumonia  Antimicrobial therapy: Doxycycline: 2/9>>  Microbiology data: None  Procedures : None  Consults: None  DVT Prophylaxis : apixaban (ELIQUIS) tablet 5 mg   Subjective: Better-still feels tight-not yet back to baseline.  Assessment/Plan: Acute on chronic hypoxic respiratory failure due to COPD exacerbation: Moving air but still feels tight-rhonchi all over-continue steroids-add scheduled nebulized bronchodilators-continue empiric doxycycline.  Not yet at baseline-not yet stable for discharge  Recent COVID-19 infection: Out of window for isolation-current hospitalization not related to COVID-19.  PAF: In sinus rhythm-stable-continue bisoprolol/Cardizem-anticoagulated with Eliquis  Chronic diastolic heart failure: Euvolemic-on Lasix.  HTN: BP stable-continue Lasix/bisoprolol and Cardizem  HLD: Continue statin  DM-2: Probably diet controlled at home-monitor CBGs with SSI-while on steroids.   Diet: Diet Order            Diet heart healthy/carb modified Room service appropriate? Yes; Fluid consistency: Thin  Diet effective now                  Code Status: Full code  Family Communication: Spouse-[name] Trapani-[phone] updated over the phone on 2/9  Disposition Plan: Status is:  Inpatient  Remains inpatient appropriate because:Inpatient level of care appropriate due to severity of illness   Dispo: The patient is from: Home              Anticipated d/c is to: Home              Anticipated d/c date is: > 3 days              Patient currently is not medically stable to d/c.   Difficult to place patient No   Barriers to Discharge: COPD exacerbation-not at baseline-on IV steroids/nebulized bronchodilators  Antimicrobial agents: Anti-infectives (From admission, onward)   Start     Dose/Rate Route Frequency Ordered Stop   01/31/21 1000  doxycycline (VIBRA-TABS) tablet 100 mg        100 mg Oral Every 12 hours 01/31/21 0528         Time spent: 25- minutes-Greater than 50% of this time was spent in counseling, explanation of diagnosis, planning of further management, and coordination of care.  MEDICATIONS: Scheduled Meds: . apixaban  5 mg Oral BID  . atorvastatin  40 mg Oral QHS  . bisoprolol  5 mg Oral Daily  . diltiazem  120 mg Oral Daily  . doxycycline  100 mg Oral Q12H  . furosemide  40 mg Oral Daily  . ipratropium-albuterol  3 mL Nebulization Q6H  . melatonin  5 mg Oral QHS  . methylPREDNISolone (SOLU-MEDROL) injection  40 mg Intravenous Q12H  . sertraline  25 mg Oral QHS  . umeclidinium bromide  1 puff Inhalation Daily   Continuous Infusions: . albuterol 10 mg/hr (01/30/21 2229)  . lactated ringers 75 mL/hr  at 01/31/21 0554   PRN Meds:.acetaminophen **OR** acetaminophen, albuterol   PHYSICAL EXAM: Vital signs: Vitals:   01/31/21 0334 01/31/21 0534 01/31/21 0721 01/31/21 1130  BP: (!) 135/57 (!) 158/65 (!) 154/57 132/76  Pulse: (!) 122 (!) 112 (!) 115 100  Resp: 20 20 20 20   Temp: 97.9 F (36.6 C) 97.7 F (36.5 C) 98.2 F (36.8 C) 98.7 F (37.1 C)  TempSrc: Oral Oral Oral Oral  SpO2: 99%  98% 96%  Weight:      Height:       Filed Weights   01/30/21 2119  Weight: 68 kg   Body mass index is 24.2 kg/m.   Gen Exam:Alert awake-not  in any distress HEENT:atraumatic, normocephalic Chest: Scattered rhonchi all over-decreased air entry at bases CVS:S1S2 regular Abdomen:soft non tender, non distended Extremities:no edema Neurology: Non focal Skin: no rash  I have personally reviewed following labs and imaging studies  LABORATORY DATA: CBC: Recent Labs  Lab 01/30/21 2122  WBC 10.7*  HGB 11.4*  HCT 36.8*  MCV 81.4  PLT 622    Basic Metabolic Panel: Recent Labs  Lab 01/30/21 2122  NA 140  K 3.8  CL 98  CO2 30  GLUCOSE 136*  BUN 17  CREATININE 0.68  CALCIUM 8.9    GFR: Estimated Creatinine Clearance: 72 mL/min (by C-G formula based on SCr of 0.68 mg/dL).  Liver Function Tests: Recent Labs  Lab 01/30/21 2122  AST 23  ALT 31  ALKPHOS 93  BILITOT 0.2*  PROT 6.7  ALBUMIN 3.4*   No results for input(s): LIPASE, AMYLASE in the last 168 hours. No results for input(s): AMMONIA in the last 168 hours.  Coagulation Profile: No results for input(s): INR, PROTIME in the last 168 hours.  Cardiac Enzymes: No results for input(s): CKTOTAL, CKMB, CKMBINDEX, TROPONINI in the last 168 hours.  BNP (last 3 results) No results for input(s): PROBNP in the last 8760 hours.  Lipid Profile: No results for input(s): CHOL, HDL, LDLCALC, TRIG, CHOLHDL, LDLDIRECT in the last 72 hours.  Thyroid Function Tests: No results for input(s): TSH, T4TOTAL, FREET4, T3FREE, THYROIDAB in the last 72 hours.  Anemia Panel: No results for input(s): VITAMINB12, FOLATE, FERRITIN, TIBC, IRON, RETICCTPCT in the last 72 hours.  Urine analysis:    Component Value Date/Time   COLORURINE YELLOW 01/14/2021 1100   APPEARANCEUR CLEAR 01/14/2021 1100   LABSPEC 1.015 01/14/2021 1100   PHURINE 7.5 01/14/2021 1100   GLUCOSEU NEGATIVE 01/14/2021 1100   HGBUR MODERATE (A) 01/14/2021 1100   BILIRUBINUR NEGATIVE 01/14/2021 1100   KETONESUR NEGATIVE 01/14/2021 1100   PROTEINUR NEGATIVE 01/14/2021 1100   NITRITE NEGATIVE 01/14/2021  1100   LEUKOCYTESUR NEGATIVE 01/14/2021 1100    Sepsis Labs: Lactic Acid, Venous    Component Value Date/Time   LATICACIDVEN 4.1 ([place]) 01/15/2021 0858    MICROBIOLOGY: No results found for this or any previous visit (from the past 240 hour(s)).  RADIOLOGY STUDIES/RESULTS: DG Chest Port 1 View  Result Date: 01/30/2021 CLINICAL DATA:  Shortness of breath, history of COPD worsening tonight. EXAM: PORTABLE CHEST 1 VIEW COMPARISON:  Chest radiograph January 14, 2021 FINDINGS: The heart size and mediastinal contours are within normal limits. Aortic atherosclerosis. Small calcified granulomas in the right lobe. Both lungs are clear. The visualized skeletal structures are unremarkable. IMPRESSION: No acute cardiopulmonary disease. Electronically Signed   By: Dahlia Bailiff MD   On: 01/30/2021 22:32     LOS: 0 days   Oren Binet, MD  [facility]    To contact the attending provider between 7A-7P or the covering provider during after hours 7P-7A, please log into the web site [email or web address] and access using universal [place] password for that web site. If you do not have the password, please call the hospital operator.  01/31/2021, 11:35 AM

## 2021-01-31 NOTE — Plan of Care (Signed)

## 2021-01-31 NOTE — ED Provider Notes (Signed)
1:02 AM Patient states he is feeling better but does not believe he is able to go home.  He has been getting winded and having his oxygen level dropped with any exertion.  We will have him admitted.  1:16 AM Dr. Marlowe Sax to admit.    Paul [name], Jenny Reichmann, MD 01/31/21 [phone]

## 2021-01-31 NOTE — Progress Notes (Signed)
Pt admitted from [facility]. Alert and oriented x 4 on 3L [place] pt endorsing SOB. Pt stable bed in low  locked position, call bell and personal belongings within reach. Will continue to monitor.

## 2021-02-01 ENCOUNTER — Ambulatory Visit: Payer: Medicare Other | Admitting: Adult Health

## 2021-02-01 DIAGNOSIS — J441 Chronic obstructive pulmonary disease with (acute) exacerbation: Secondary | ICD-10-CM | POA: Diagnosis not present

## 2021-02-01 DIAGNOSIS — I10 Essential (primary) hypertension: Secondary | ICD-10-CM | POA: Diagnosis not present

## 2021-02-01 DIAGNOSIS — E1169 Type 2 diabetes mellitus with other specified complication: Secondary | ICD-10-CM | POA: Diagnosis not present

## 2021-02-01 DIAGNOSIS — I480 Paroxysmal atrial fibrillation: Secondary | ICD-10-CM | POA: Diagnosis not present

## 2021-02-01 LAB — GLUCOSE, CAPILLARY
Glucose-Capillary: 147 mg/dL — ABNORMAL HIGH (ref 70–99)
Glucose-Capillary: 82 mg/dL (ref 70–99)
Glucose-Capillary: 117 mg/dL — ABNORMAL HIGH (ref 70–99)
Glucose-Capillary: 191 mg/dL — ABNORMAL HIGH (ref 70–99)

## 2021-02-01 MED ORDER — LEVALBUTEROL TARTRATE 45 MCG/ACT IN AERO
2.0000 | INHALATION_SPRAY | Freq: Four times a day (QID) | RESPIRATORY_TRACT | Status: DC
  Administered 2021-02-02 (×2): 2 via RESPIRATORY_TRACT
  Filled 2021-02-01: qty 15

## 2021-02-01 NOTE — Progress Notes (Signed)
PROGRESS NOTE        PATIENT DETAILS Name: Paul [name] Age: 75 y.o. Sex: male Date of Birth: Jul 07, 1946 Admit Date: 01/30/2021 Admitting Physician Eben Burow, MD PCP:Cox, Elnita Maxwell, MD  Brief Narrative: Patient is a 75 y.o. 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 fib on Eliquis-recent hospitalizations-presenting with shortness of breath-thought to have acute hypoxic respiratory failure due to COVID-19 pneumonia.  Significant events: 2/8>> admit for hypoxia due to COPD exacerbation 1/23-1/24>> hospitalized-COPD exacerbation 1/14-1/18>> hospitalized-hypoxia due to COVID-19 pneumonia  Significant studies: 2/8>> chest x-ray: No pneumonia  Antimicrobial therapy: Doxycycline: 2/9>>  Microbiology data: None  Procedures : None  Consults: None  DVT Prophylaxis : apixaban (ELIQUIS) tablet 5 mg   Subjective: Still somewhat short of breath-still feels tight-not yet at baseline.  Assessment/Plan: Acute on chronic hypoxic respiratory failure due to COPD exacerbation: Improving-moving air well a bit more compared to yesterday-stable with some rhonchi-claims that he is still "tight".  Continue bronchodilators-IV steroids-empiric doxycycline-and reassess on 2/11.  Recent COVID-19 infection: Out of window for isolation-current hospitalization not related to COVID-19.  PAF: In sinus rhythm-stable-continue bisoprolol/Cardizem-anticoagulated with Eliquis  Chronic diastolic heart failure: Euvolemic-on Lasix.  HTN: BP stable-continue Lasix/bisoprolol and Cardizem  HLD: Continue statin  DM-2: Probably diet controlled at home-monitor CBGs with SSI-while on steroids.   Diet: Diet Order            Diet heart healthy/carb modified Room service appropriate? Yes; Fluid consistency: Thin  Diet effective now                  Code Status: Full code  Family Communication: Spouse-[name] [phone] updated over the phone on  2/10  Disposition Plan: Status is: Inpatient  Remains inpatient appropriate because:Inpatient level of care appropriate due to severity of illness   Dispo: The patient is from: Home              Anticipated d/c is to: Home              Anticipated d/c date is: > 3 days              Patient currently is not medically stable to d/c.   Difficult to place patient No   Barriers to Discharge: COPD exacerbation-not at baseline-on IV steroids/nebulized bronchodilators  Antimicrobial agents: Anti-infectives (From admission, onward)   Start     Dose/Rate Route Frequency Ordered Stop   01/31/21 1000  doxycycline (VIBRA-TABS) tablet 100 mg        100 mg Oral Every 12 hours 01/31/21 0528         Time spent: 25- minutes-Greater than 50% of this time was spent in counseling, explanation of diagnosis, planning of further management, and coordination of care.  MEDICATIONS: Scheduled Meds: . apixaban  5 mg Oral BID  . atorvastatin  40 mg Oral QHS  . bisoprolol  5 mg Oral Daily  . diltiazem  120 mg Oral Daily  . doxycycline  100 mg Oral Q12H  . furosemide  40 mg Oral Daily  . insulin aspart  0-9 Units Subcutaneous TID WC  . levalbuterol  2 puff Inhalation Q6H  . melatonin  5 mg Oral QHS  . methylPREDNISolone (SOLU-MEDROL) injection  40 mg Intravenous Q12H  . sertraline  25 mg Oral QHS  . umeclidinium bromide  1 puff Inhalation Daily  Continuous Infusions: . lactated ringers 10 mL/hr at 01/31/21 1449   PRN Meds:.acetaminophen **OR** acetaminophen, albuterol   PHYSICAL EXAM: Vital signs: Vitals:   01/31/21 2355 02/01/21 0441 02/01/21 0732 02/01/21 1140  BP: 129/62 132/66 130/66 98/71  Pulse: 93 90 87 84  Resp: (!) 23 (!) 21 (!) 22 20  Temp: 98 F (36.7 C) 98.3 F (36.8 C) 98 F (36.7 C) 98 F (36.7 C)  TempSrc: Axillary Axillary Oral Oral  SpO2: 100% 99% 90% 92%  Weight:      Height:       Filed Weights   01/30/21 2119  Weight: 68 kg   Body mass index is 24.2 kg/m.   Gen Exam:Alert awake-not in any distress HEENT:atraumatic, normocephalic Chest: Moving air-decreased air entry in the bases-scattered rhonchi all over. CVS:S1S2 regular Abdomen:soft non tender, non distended Extremities:no edema Neurology: Non focal Skin: no rash  I have personally reviewed following labs and imaging studies  LABORATORY DATA: CBC: Recent Labs  Lab 01/30/21 2122  WBC 10.7*  HGB 11.4*  HCT 36.8*  MCV 81.4  PLT 841    Basic Metabolic Panel: Recent Labs  Lab 01/30/21 2122  NA 140  K 3.8  CL 98  CO2 30  GLUCOSE 136*  BUN 17  CREATININE 0.68  CALCIUM 8.9    GFR: Estimated Creatinine Clearance: 72 mL/min (by C-G formula based on SCr of 0.68 mg/dL).  Liver Function Tests: Recent Labs  Lab 01/30/21 2122  AST 23  ALT 31  ALKPHOS 93  BILITOT 0.2*  PROT 6.7  ALBUMIN 3.4*   No results for input(s): LIPASE, AMYLASE in the last 168 hours. No results for input(s): AMMONIA in the last 168 hours.  Coagulation Profile: No results for input(s): INR, PROTIME in the last 168 hours.  Cardiac Enzymes: No results for input(s): CKTOTAL, CKMB, CKMBINDEX, TROPONINI in the last 168 hours.  BNP (last 3 results) No results for input(s): PROBNP in the last 8760 hours.  Lipid Profile: No results for input(s): CHOL, HDL, LDLCALC, TRIG, CHOLHDL, LDLDIRECT in the last 72 hours.  Thyroid Function Tests: No results for input(s): TSH, T4TOTAL, FREET4, T3FREE, THYROIDAB in the last 72 hours.  Anemia Panel: No results for input(s): VITAMINB12, FOLATE, FERRITIN, TIBC, IRON, RETICCTPCT in the last 72 hours.  Urine analysis:    Component Value Date/Time   COLORURINE YELLOW 01/14/2021 1100   APPEARANCEUR CLEAR 01/14/2021 1100   LABSPEC 1.015 01/14/2021 1100   PHURINE 7.5 01/14/2021 1100   GLUCOSEU NEGATIVE 01/14/2021 1100   HGBUR MODERATE (A) 01/14/2021 1100   BILIRUBINUR NEGATIVE 01/14/2021 1100   KETONESUR NEGATIVE 01/14/2021 1100   PROTEINUR NEGATIVE  01/14/2021 1100   NITRITE NEGATIVE 01/14/2021 1100   LEUKOCYTESUR NEGATIVE 01/14/2021 1100    Sepsis Labs: Lactic Acid, Venous    Component Value Date/Time   LATICACIDVEN 4.1 ([place]) 01/15/2021 0858    MICROBIOLOGY: No results found for this or any previous visit (from the past 240 hour(s)).  RADIOLOGY STUDIES/RESULTS: DG Chest Port 1 View  Result Date: 01/30/2021 CLINICAL DATA:  Shortness of breath, history of COPD worsening tonight. EXAM: PORTABLE CHEST 1 VIEW COMPARISON:  Chest radiograph January 14, 2021 FINDINGS: The heart size and mediastinal contours are within normal limits. Aortic atherosclerosis. Small calcified granulomas in the right lobe. Both lungs are clear. The visualized skeletal structures are unremarkable. IMPRESSION: No acute cardiopulmonary disease. Electronically Signed   By: Dahlia Bailiff MD   On: 01/30/2021 22:32     LOS: 1 day  Oren Binet, MD  [facility]    To contact the attending provider between 7A-7P or the covering provider during after hours 7P-7A, please log into the web site [email or web address] and access using universal [place] password for that web site. If you do not have the password, please call the hospital operator.  02/01/2021, 2:41 PM

## 2021-02-02 DIAGNOSIS — I10 Essential (primary) hypertension: Secondary | ICD-10-CM | POA: Diagnosis not present

## 2021-02-02 DIAGNOSIS — J441 Chronic obstructive pulmonary disease with (acute) exacerbation: Secondary | ICD-10-CM | POA: Diagnosis not present

## 2021-02-02 DIAGNOSIS — I480 Paroxysmal atrial fibrillation: Secondary | ICD-10-CM | POA: Diagnosis not present

## 2021-02-02 DIAGNOSIS — E1169 Type 2 diabetes mellitus with other specified complication: Secondary | ICD-10-CM | POA: Diagnosis not present

## 2021-02-02 LAB — GLUCOSE, CAPILLARY: Glucose-Capillary: 142 mg/dL — ABNORMAL HIGH (ref 70–99)

## 2021-02-02 MED ORDER — PREDNISONE 10 MG PO TABS: ORAL_TABLET | ORAL | 0 refills | Status: DC

## 2021-02-02 MED ORDER — DOXYCYCLINE HYCLATE 100 MG PO TABS: 100.0000 mg | ORAL_TABLET | Freq: Two times a day (BID) | ORAL | 0 refills | Status: AC

## 2021-02-02 NOTE — Discharge Summary (Signed)
PATIENT DETAILS Name: Paul [name] Age: 75 y.o. Sex: male Date of Birth: March 20, 1946 MRN: [id number]. Admitting Physician: Eben Burow, MD PCP:Cox, Elnita Maxwell, MD  Admit Date: 01/30/2021 Discharge date: 02/02/2021  Recommendations for Outpatient Follow-up:  1. Follow up with PCP in 1-2 weeks 2. Please obtain CMP/CBC in one week 3. Please ensure follow-up with pulmonology  Admitted From:  Home  Disposition: [place]: Yes  Equipment/Devices: None  Discharge Condition: Stable  CODE STATUS: FULL CODE  Diet recommendation:  Diet Order            Diet - low sodium heart healthy           Diet general           Diet heart healthy/carb modified Room service appropriate? Yes; Fluid consistency: Thin  Diet effective now                  Brief Narrative: Patient is a 75 y.o. 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 fib on Eliquis-recent hospitalizations-presenting with shortness of breath-thought to have acute hypoxic respiratory failure due to COVID-19 pneumonia.  Significant events: 2/8>> admit for hypoxia due to COPD exacerbation 1/23-1/24>> hospitalized-COPD exacerbation 1/14-1/18>> hospitalized-hypoxia due to COVID-19 pneumonia  Significant studies: 2/8>> chest x-ray: No pneumonia  Antimicrobial therapy: Doxycycline: 2/9>>  Microbiology data: None  Procedures : None  Consults: None  Brief Hospital Course: Acute on chronic hypoxic respiratory failure due to COPD exacerbation:  Has improved-significant component of anxiety as well-he is on room air at rest-but with ambulation does desaturate.  Since overall improved-we will place him on tapering prednisone-resume his other usual inhalers-and have made him an appointment with pulmonology on 2/14.  Patient to use home O2 24/7 until seen by PCCM.  Recent COVID-19 infection: Out of window for isolation-current hospitalization not related to COVID-19.  PAF: In sinus  rhythm-stable-continue bisoprolol/Cardizem-anticoagulated with Eliquis  Chronic diastolic heart failure: Euvolemic-on Lasix.  HTN: BP stable-continue Lasix/bisoprolol and Cardizem  HLD: Continue statin  DM-2: Probably diet controlled at home-monitor CBGs with SSI-while on steroids:  COVID-19 Labs:  No results for input(s): DDIMER, FERRITIN, LDH, CRP in the last 72 hours.  Lab Results  Component Value Date   SARSCOV2NAA POSITIVE (A) 01/14/2021   SARSCOV2NAA POSITIVE (A) 12/24/2020   [place] NEGATIVE 12/07/2020   [place] NEGATIVE 12/02/2020      Discharge Diagnoses:  Principal Problem:   COPD exacerbation ([place]) Active Problems:   Paroxysmal atrial fibrillation ([place])   DM type 2 with diabetic mixed hyperlipidemia [facility])   Essential hypertension   Discharge Instructions:    Person Under Monitoring Name: Jw Covin  Location: [address] [place] [address]   Infection Prevention Recommendations for Individuals Confirmed to have, or Being Evaluated for, 2019 Novel Coronavirus (COVID-19) Infection Who Receive Care at Home  Individuals who are confirmed to have, or are being evaluated for, COVID-19 should follow the prevention steps below until a healthcare provider or local or state health department says they can return to normal activities.  Stay home except to get medical care You should restrict activities outside your home, except for getting medical care. Do not go to work, school, or public areas, and do not use public transportation or taxis.  Call ahead before visiting your doctor Before your medical appointment, call the healthcare provider and tell them that you have, or are being evaluated for, COVID-19 infection. This will help the healthcare provider's office take steps to keep  other people from getting infected. Ask your healthcare provider to call the local or state health department.  Monitor your symptoms Seek prompt medical  attention if your illness is worsening (e.g., difficulty breathing). Before going to your medical appointment, call the healthcare provider and tell them that you have, or are being evaluated for, COVID-19 infection. Ask your healthcare provider to call the local or state health department.  Wear a facemask You should wear a facemask that covers your nose and mouth when you are in the same room with other people and when you visit a healthcare provider. People who live with or visit you should also wear a facemask while they are in the same room with you.  Separate yourself from other people in your home As much as possible, you should stay in a different room from other people in your home. Also, you should use a separate bathroom, if available.  Avoid sharing household items You should not share dishes, drinking glasses, cups, eating utensils, towels, bedding, or other items with other people in your home. After using these items, you should wash them thoroughly with soap and water.  Cover your coughs and sneezes Cover your mouth and nose with a tissue when you cough or sneeze, or you can cough or sneeze into your sleeve. Throw used tissues in a lined trash can, and immediately wash your hands with soap and water for at least 20 seconds or use an alcohol-based hand rub.  Wash your [facility] your hands often and thoroughly with soap and water for at least 20 seconds. You can use an alcohol-based hand sanitizer if soap and water are not available and if your hands are not visibly dirty. Avoid touching your eyes, nose, and mouth with unwashed hands.   Prevention Steps for Caregivers and Household Members of Individuals Confirmed to have, or Being Evaluated for, COVID-19 Infection Being Cared for in the Home  If you live with, or provide care at home for, a person confirmed to have, or being evaluated for, COVID-19 infection please follow these guidelines to prevent  infection:  Follow healthcare provider's instructions Make sure that you understand and can help the patient follow any healthcare provider instructions for all care.  Provide for the patient's basic needs You should help the patient with basic needs in the home and provide support for getting groceries, prescriptions, and other personal needs.  Monitor the patient's symptoms If they are getting sicker, call his or her medical provider and tell them that the patient has, or is being evaluated for, COVID-19 infection. This will help the healthcare provider's office take steps to keep other people from getting infected. Ask the healthcare provider to call the local or state health department.  Limit the number of people who have contact with the patient  If possible, have only one caregiver for the patient.  Other household members should stay in another home or place of residence. If this is not possible, they should stay  in another room, or be separated from the patient as much as possible. Use a separate bathroom, if available.  Restrict visitors who do not have an essential need to be in the home.  Keep older adults, very young children, and other sick people away from the patient Keep older adults, very young children, and those who have compromised immune systems or chronic health conditions away from the patient. This includes people with chronic heart, lung, or kidney conditions, diabetes, and cancer.  Ensure good ventilation  Make sure that shared spaces in the home have good air flow, such as from an air conditioner or an opened window, weather permitting.  Wash your hands often  Wash your hands often and thoroughly with soap and water for at least 20 seconds. You can use an alcohol based hand sanitizer if soap and water are not available and if your hands are not visibly dirty.  Avoid touching your eyes, nose, and mouth with unwashed hands.  Use disposable paper towels  to dry your hands. If not available, use dedicated cloth towels and replace them when they become wet.  Wear a facemask and gloves  Wear a disposable facemask at all times in the room and gloves when you touch or have contact with the patient's blood, body fluids, and/or secretions or excretions, such as sweat, saliva, sputum, nasal mucus, vomit, urine, or feces.  Ensure the mask fits over your nose and mouth tightly, and do not touch it during use.  Throw out disposable facemasks and gloves after using them. Do not reuse.  Wash your hands immediately after removing your facemask and gloves.  If your personal clothing becomes contaminated, carefully remove clothing and launder. Wash your hands after handling contaminated clothing.  Place all used disposable facemasks, gloves, and other waste in a lined container before disposing them with other household waste.  Remove gloves and wash your hands immediately after handling these items.  Do not share dishes, glasses, or other household items with the patient  Avoid sharing household items. You should not share dishes, drinking glasses, cups, eating utensils, towels, bedding, or other items with a patient who is confirmed to have, or being evaluated for, COVID-19 infection.  After the person uses these items, you should wash them thoroughly with soap and water.  Wash laundry thoroughly  Immediately remove and wash clothes or bedding that have blood, body fluids, and/or secretions or excretions, such as sweat, saliva, sputum, nasal mucus, vomit, urine, or feces, on them.  Wear gloves when handling laundry from the patient.  Read and follow directions on labels of laundry or clothing items and detergent. In general, wash and dry with the warmest temperatures recommended on the label.  Clean all areas the individual has used often  Clean all touchable surfaces, such as counters, tabletops, doorknobs, bathroom fixtures, toilets, phones,  keyboards, tablets, and bedside tables, every day. Also, clean any surfaces that may have blood, body fluids, and/or secretions or excretions on them.  Wear gloves when cleaning surfaces the patient has come in contact with.  Use a diluted bleach solution (e.g., dilute bleach with 1 part bleach and 10 parts water) or a household disinfectant with a label that says EPA-registered for coronaviruses. To make a bleach solution at home, add 1 tablespoon of bleach to 1 quart (4 cups) of water. For a larger supply, add  cup of bleach to 1 gallon (16 cups) of water.  Read labels of cleaning products and follow recommendations provided on product labels. Labels contain instructions for safe and effective use of the cleaning product including precautions you should take when applying the product, such as wearing gloves or eye protection and making sure you have good ventilation during use of the product.  Remove gloves and wash hands immediately after cleaning.  Monitor yourself for signs and symptoms of illness Caregivers and household members are considered close contacts, should monitor their health, and will be asked to limit movement outside of the home to the extent possible. Follow the  monitoring steps for close contacts listed on the symptom monitoring form.   ? If you have additional questions, contact your local health department or call the epidemiologist on call at [phone] (available 24/7). ? This guidance is subject to change. For the most up-to-date guidance from CDC, please refer to their website: YouBlogs.pl    Activity:  As tolerated with Full fall precautions use walker/cane & assistance as needed  Discharge Instructions    Call MD for:  difficulty breathing, headache or visual disturbances   Complete by: As directed    Diet - low sodium heart healthy   Complete by: As directed    Diet general   Complete by: As  directed    Discharge instructions   Complete by: As directed    Follow with Primary MD  [name], MD in 1-2 weeks  Please follow with pulmonology clinic-appointment made on 2/14.  Please get a complete blood count and chemistry panel checked by your Primary MD at your next visit, and again as instructed by your Primary MD.  Get Medicines reviewed and adjusted: Please take all your medications with you for your next visit with your Primary MD  Laboratory/radiological data: Please request your Primary MD to go over all hospital tests and procedure/radiological results at the follow up, please ask your Primary MD to get all Hospital records sent to his/her office.  In some cases, they will be blood work, cultures and biopsy results pending at the time of your discharge. Please request that your primary care M.D. follows up on these results.  Also Note the following: If you experience worsening of your admission symptoms, develop shortness of breath, life threatening emergency, suicidal or homicidal thoughts you must seek medical attention immediately by calling 911 or calling your MD immediately  if symptoms less severe.  You must read complete instructions/literature along with all the possible adverse reactions/side effects for all the Medicines you take and that have been prescribed to you. Take any new Medicines after you have completely understood and accpet all the possible adverse reactions/side effects.   Do not drive when taking Pain medications or sleeping medications (Benzodaizepines)  Do not take more than prescribed Pain, Sleep and Anxiety Medications. It is not advisable to combine anxiety,sleep and pain medications without talking with your primary care practitioner  Special Instructions: If you have smoked or chewed Tobacco  in the last 2 yrs please stop smoking, stop any regular Alcohol  and or any Recreational drug use.  Wear Seat belts while driving.  Please  note: You were cared for by a hospitalist during your hospital stay. Once you are discharged, your primary care physician will handle any further medical issues. Please note that NO REFILLS for any discharge medications will be authorized once you are discharged, as it is imperative that you return to your primary care physician (or establish a relationship with a primary care physician if you do not have one) for your post hospital discharge needs so that they can reassess your need for medications and monitor your lab values.   Increase activity slowly   Complete by: As directed    No dressing needed   Complete by: As directed      Allergies as of 02/02/2021      Reactions   Daliresp [roflumilast] Diarrhea, Nausea And Vomiting   Alendronate Anxiety, Other (See Comments)   Jittery/nervous   Levaquin [levofloxacin] Palpitations, Other (See Comments)   Made the B/P fluctuate and heart raced  Medication List    TAKE these medications   acetaminophen 325 MG tablet Commonly known as: TYLENOL Take 2 tablets (650 mg total) by mouth every 6 (six) hours as needed for mild pain or headache (fever >/= 101).   apixaban 5 MG Tabs tablet Commonly known as: ELIQUIS Take 1 tablet (5 mg total) by mouth 2 (two) times daily.   ascorbic acid 500 MG tablet Commonly known as: VITAMIN C Take 1 tablet (500 mg total) by mouth daily. What changed: how much to take   atorvastatin 40 MG tablet Commonly known as: LIPITOR TAKE 1 TABLET(40 MG) BY MOUTH DAILY AFTER SUPPER What changed:   how much to take  how to take this  when to take this  additional instructions   bisoprolol 5 MG tablet Commonly known as: ZEBETA Take 1 tablet (5 mg total) by mouth daily.   budesonide-formoterol 160-4.5 MCG/ACT inhaler Commonly known as: SYMBICORT Inhale 2 puffs into the lungs 2 (two) times daily.   calcium carbonate 600 MG Tabs tablet Commonly known as: OS-CAL Take 600 mg by mouth daily.   denosumab  60 MG/ML Sosy injection Commonly known as: PROLIA Inject 60 mg into the skin every 6 (six) months.   diltiazem 120 MG 24 hr capsule Commonly known as: CARDIZEM CD Take 1 capsule (120 mg total) by mouth daily.   diltiazem 60 MG tablet Commonly known as: Cardizem Take 1 tablet (60 mg total) by mouth 4 (four) times daily as needed. What changed: reasons to take this   doxycycline 100 MG tablet Commonly known as: VIBRA-TABS Take 1 tablet (100 mg total) by mouth every 12 (twelve) hours for 3 days.   furosemide 40 MG tablet Commonly known as: LASIX Take 1 tablet (40 mg total) by mouth daily.   guaiFENesin 600 MG 12 hr tablet Commonly known as: MUCINEX Take 1,200 mg by mouth 2 (two) times daily.   loratadine 10 MG tablet Commonly known as: CLARITIN Take 10 mg by mouth in the morning.   melatonin 5 MG Tabs Take 5 mg by mouth at bedtime.   multivitamin with minerals Tabs tablet Take 1 tablet by mouth daily.   NICOTINAMIDE PO Take 1 tablet by mouth in the morning and at bedtime.   omeprazole 20 MG tablet Commonly known as: PRILOSEC OTC Take 20 mg by mouth 2 (two) times daily before a meal.   OXYGEN Inhale 2-2.5 L/min into the lungs See admin instructions. Inhale 2 L/min of oxygen into the lungs at bedtime and when ambulatory   predniSONE 10 MG tablet Commonly known as: DELTASONE Take 40 mg p.o. daily for 3 days, then 30 mg p.o. daily for 3 days, then 20 mg p.o. daily for 3 days, then 10 mg p.o. daily for 3 days and stop. What changed:   medication strength  how much to take  how to take this  when to take this  additional instructions  Another medication with the same name was removed. Continue taking this medication, and follow the directions you see here.   ProAir HFA 108 (90 Base) MCG/ACT inhaler Generic drug: albuterol Inhale 2 puffs into the lungs every 6 (six) hours as needed for wheezing or shortness of breath. What changed: Another medication with the  same name was changed. Make sure you understand how and when to take each.   albuterol (2.5 MG/3ML) 0.083% nebulizer solution Commonly known as: PROVENTIL Take 3 mLs (2.5 mg total) by nebulization in the morning and at bedtime. What changed:  when to take this   sertraline 25 MG tablet Commonly known as: ZOLOFT Take 1 tablet (25 mg total) by mouth at bedtime.   sodium chloride 0.65 % nasal spray Commonly known as: OCEAN Place 1 spray into the nose as needed for congestion.   Spiriva Respimat 2.5 MCG/ACT Aers Generic drug: Tiotropium Bromide Monohydrate Inhale 2 puffs into the lungs daily.   Vitamin D 50 MCG (2000 UT) Caps Take 2,000 Units by mouth daily.            Discharge Care Instructions  (From admission, onward)         Start     Ordered   02/02/21 0000  No dressing needed        02/02/21 1033          Follow-up Information    Magdalen Spatz, NP Follow up on 02/05/2021.   Specialty: Pulmonary Disease Why: appt at 12 pm Contact information: [address] [place] [address] [phone]        [name], MD. Schedule an appointment as soon as possible for a visit in 1 week(s).   Specialties: Family Medicine, Interventional Cardiology, Radiology, Anesthesiology Contact information: [address] [place] [address] [phone]        Jerline Pain, MD. Schedule an appointment as soon as possible for a visit in 2 week(s).   Specialty: Cardiology Contact information: [address] [place]  [address] [phone]              Allergies  Allergen Reactions  . Daliresp [Roflumilast] Diarrhea and Nausea And Vomiting  . Alendronate Anxiety and Other (See Comments)    Jittery/nervous  . Levaquin [Levofloxacin] Palpitations and Other (See Comments)    Made the B/P fluctuate and heart raced      Consultations:   None  Other Procedures/Studies: DG Chest 2 View  Result Date:  01/05/2021 CLINICAL DATA:  Syncope and fall today.  Shortness of breath. EXAM: CHEST - 2 VIEW COMPARISON:  Single-view of the chest 12/24/2020 and 12/07/2020. CT chest 03/27/2020. FINDINGS: The lungs are emphysematous. There is a hazy focus of airspace opacity in the lingula which is new since the prior exams. The right lung is clear. No pneumothorax or pleural effusion. Heart size is normal. Aortic atherosclerosis. No acute or focal bony abnormality. IMPRESSION: Hazy airspace opacity in lingula worrisome for pneumonia. Aortic Atherosclerosis (ICD10-I70.0) and Emphysema (ICD10-J43.9). Electronically Signed   By: Inge Rise M.D.   On: 01/05/2021 13:44   CT Head Wo Contrast  Result Date: 01/05/2021 CLINICAL DATA:  Syncope with fall. EXAM: CT HEAD WITHOUT CONTRAST CT CERVICAL SPINE WITHOUT CONTRAST TECHNIQUE: Multidetector CT imaging of the head and cervical spine was performed following the standard protocol without intravenous contrast. Multiplanar CT image reconstructions of the cervical spine were also generated. COMPARISON:  Head CT 03/27/2020. FINDINGS: CT HEAD FINDINGS Brain: There is no evidence for acute hemorrhage, hydrocephalus, mass lesion, or abnormal extra-axial fluid collection. No definite CT evidence for acute infarction. Diffuse loss of parenchymal volume is consistent with atrophy. Patchy low attenuation in the deep hemispheric and periventricular white matter is nonspecific, but likely reflects chronic microvascular ischemic demyelination. Vascular: No hyperdense vessel or unexpected calcification. Skull: No evidence for fracture. No worrisome lytic or sclerotic lesion. Sinuses/Orbits: The visualized paranasal sinuses and mastoid air cells are clear. Visualized portions of the globes and intraorbital fat are unremarkable. Other: Soft tissue contusion noted left parietal  scalp. CT CERVICAL SPINE FINDINGS Alignment: Straightening of normal cervical lordosis. No subluxation. Skull base and  vertebrae: No acute fracture. No primary bone lesion or focal pathologic process. Soft tissues and spinal canal: No prevertebral fluid or swelling. No visible canal hematoma. Disc levels: Loss of disc height noted C2-3 and C5-6. Diffuse facet degeneration noted bilaterally. Upper chest: Centrilobular emphysema with calcific pleuroparenchymal scarring in both apices. Other: None. IMPRESSION: 1. No acute intracranial abnormality. Atrophy with chronic small vessel white matter ischemic disease. 2. Soft tissue contusion left parietal scalp. 3. Degenerative changes in the cervical spine without fracture. 4. Loss of cervical lordosis. This can be related to patient positioning, muscle spasm or soft tissue injury. Electronically Signed   By: Misty Stanley M.D.   On: 01/05/2021 13:44   CT Cervical Spine Wo Contrast  Result Date: 01/05/2021 CLINICAL DATA:  Syncope with fall. EXAM: CT HEAD WITHOUT CONTRAST CT CERVICAL SPINE WITHOUT CONTRAST TECHNIQUE: Multidetector CT imaging of the head and cervical spine was performed following the standard protocol without intravenous contrast. Multiplanar CT image reconstructions of the cervical spine were also generated. COMPARISON:  Head CT 03/27/2020. FINDINGS: CT HEAD FINDINGS Brain: There is no evidence for acute hemorrhage, hydrocephalus, mass lesion, or abnormal extra-axial fluid collection. No definite CT evidence for acute infarction. Diffuse loss of parenchymal volume is consistent with atrophy. Patchy low attenuation in the deep hemispheric and periventricular white matter is nonspecific, but likely reflects chronic microvascular ischemic demyelination. Vascular: No hyperdense vessel or unexpected calcification. Skull: No evidence for fracture. No worrisome lytic or sclerotic lesion. Sinuses/Orbits: The visualized paranasal sinuses and mastoid air cells are clear. Visualized portions of the globes and intraorbital fat are unremarkable. Other: Soft tissue contusion noted  left parietal scalp. CT CERVICAL SPINE FINDINGS Alignment: Straightening of normal cervical lordosis. No subluxation. Skull base and vertebrae: No acute fracture. No primary bone lesion or focal pathologic process. Soft tissues and spinal canal: No prevertebral fluid or swelling. No visible canal hematoma. Disc levels: Loss of disc height noted C2-3 and C5-6. Diffuse facet degeneration noted bilaterally. Upper chest: Centrilobular emphysema with calcific pleuroparenchymal scarring in both apices. Other: None. IMPRESSION: 1. No acute intracranial abnormality. Atrophy with chronic small vessel white matter ischemic disease. 2. Soft tissue contusion left parietal scalp. 3. Degenerative changes in the cervical spine without fracture. 4. Loss of cervical lordosis. This can be related to patient positioning, muscle spasm or soft tissue injury. Electronically Signed   By: Misty Stanley M.D.   On: 01/05/2021 13:44   DG Chest Port 1 View  Result Date: 01/30/2021 CLINICAL DATA:  Shortness of breath, history of COPD worsening tonight. EXAM: PORTABLE CHEST 1 VIEW COMPARISON:  Chest radiograph January 14, 2021 FINDINGS: The heart size and mediastinal contours are within normal limits. Aortic atherosclerosis. Small calcified granulomas in the right lobe. Both lungs are clear. The visualized skeletal structures are unremarkable. IMPRESSION: No acute cardiopulmonary disease. Electronically Signed   By: Dahlia Bailiff MD   On: 01/30/2021 22:32   DG Chest Portable 1 View  Result Date: 01/14/2021 CLINICAL DATA:  75 year old male with history of shortness of breath and chest pain since yesterday evening. EXAM: PORTABLE CHEST 1 VIEW COMPARISON:  Chest x-ray 01/05/2021. FINDINGS: Lung volumes are normal. No consolidative airspace disease. No pleural effusions. No pneumothorax. Small calcified granulomas in the right upper lobe, similar to the prior study. No other pulmonary nodule or mass noted. Pulmonary vasculature and the  cardiomediastinal silhouette are within normal limits. Atherosclerosis in  the thoracic aorta. IMPRESSION: 1. No radiographic evidence of acute cardiopulmonary disease. 2. Aortic atherosclerosis. Electronically Signed   By: Vinnie Langton M.D.   On: 01/14/2021 10:45     TODAY-DAY OF DISCHARGE:  Subjective:   Marquett Bertoli today has no headache,no chest abdominal pain,no new weakness tingling or numbness, feels much better wants to go home today.   Objective:   Blood pressure 122/77, pulse 81, temperature 98 F (36.7 C), temperature source Oral, resp. rate 18, height 5\' 6"  (1.676 m), weight 68 kg, SpO2 97 %.  Intake/Output Summary (Last 24 hours) at 02/02/2021 1033 Last data filed at 02/02/2021 0500 Gross per 24 hour  Intake --  Output 350 ml  Net -350 ml   Filed Weights   01/30/21 2119  Weight: 68 kg    Exam: Awake Alert, Oriented *3, No new F.N deficits, Normal affect [place].AT,PERRAL Supple Neck,No JVD, No cervical lymphadenopathy appriciated.  Symmetrical Chest wall movement, Good air movement bilaterally, CTAB RRR,No Gallops,Rubs or new Murmurs, No Parasternal Heave +ve B.Sounds, Abd Soft, Non tender, No organomegaly appriciated, No rebound -guarding or rigidity. No Cyanosis, Clubbing or edema, No new Rash or bruise   PERTINENT RADIOLOGIC STUDIES: DG Chest 2 View  Result Date: 01/05/2021 CLINICAL DATA:  Syncope and fall today.  Shortness of breath. EXAM: CHEST - 2 VIEW COMPARISON:  Single-view of the chest 12/24/2020 and 12/07/2020. CT chest 03/27/2020. FINDINGS: The lungs are emphysematous. There is a hazy focus of airspace opacity in the lingula which is new since the prior exams. The right lung is clear. No pneumothorax or pleural effusion. Heart size is normal. Aortic atherosclerosis. No acute or focal bony abnormality. IMPRESSION: Hazy airspace opacity in lingula worrisome for pneumonia. Aortic Atherosclerosis (ICD10-I70.0) and Emphysema (ICD10-J43.9). Electronically Signed    By: Inge Rise M.D.   On: 01/05/2021 13:44   CT Head Wo Contrast  Result Date: 01/05/2021 CLINICAL DATA:  Syncope with fall. EXAM: CT HEAD WITHOUT CONTRAST CT CERVICAL SPINE WITHOUT CONTRAST TECHNIQUE: Multidetector CT imaging of the head and cervical spine was performed following the standard protocol without intravenous contrast. Multiplanar CT image reconstructions of the cervical spine were also generated. COMPARISON:  Head CT 03/27/2020. FINDINGS: CT HEAD FINDINGS Brain: There is no evidence for acute hemorrhage, hydrocephalus, mass lesion, or abnormal extra-axial fluid collection. No definite CT evidence for acute infarction. Diffuse loss of parenchymal volume is consistent with atrophy. Patchy low attenuation in the deep hemispheric and periventricular white matter is nonspecific, but likely reflects chronic microvascular ischemic demyelination. Vascular: No hyperdense vessel or unexpected calcification. Skull: No evidence for fracture. No worrisome lytic or sclerotic lesion. Sinuses/Orbits: The visualized paranasal sinuses and mastoid air cells are clear. Visualized portions of the globes and intraorbital fat are unremarkable. Other: Soft tissue contusion noted left parietal scalp. CT CERVICAL SPINE FINDINGS Alignment: Straightening of normal cervical lordosis. No subluxation. Skull base and vertebrae: No acute fracture. No primary bone lesion or focal pathologic process. Soft tissues and spinal canal: No prevertebral fluid or swelling. No visible canal hematoma. Disc levels: Loss of disc height noted C2-3 and C5-6. Diffuse facet degeneration noted bilaterally. Upper chest: Centrilobular emphysema with calcific pleuroparenchymal scarring in both apices. Other: None. IMPRESSION: 1. No acute intracranial abnormality. Atrophy with chronic small vessel white matter ischemic disease. 2. Soft tissue contusion left parietal scalp. 3. Degenerative changes in the cervical spine without fracture. 4. Loss  of cervical lordosis. This can be related to patient positioning, muscle spasm or soft tissue injury. Electronically  Signed   By: Misty Stanley M.D.   On: 01/05/2021 13:44   CT Cervical Spine Wo Contrast  Result Date: 01/05/2021 CLINICAL DATA:  Syncope with fall. EXAM: CT HEAD WITHOUT CONTRAST CT CERVICAL SPINE WITHOUT CONTRAST TECHNIQUE: Multidetector CT imaging of the head and cervical spine was performed following the standard protocol without intravenous contrast. Multiplanar CT image reconstructions of the cervical spine were also generated. COMPARISON:  Head CT 03/27/2020. FINDINGS: CT HEAD FINDINGS Brain: There is no evidence for acute hemorrhage, hydrocephalus, mass lesion, or abnormal extra-axial fluid collection. No definite CT evidence for acute infarction. Diffuse loss of parenchymal volume is consistent with atrophy. Patchy low attenuation in the deep hemispheric and periventricular white matter is nonspecific, but likely reflects chronic microvascular ischemic demyelination. Vascular: No hyperdense vessel or unexpected calcification. Skull: No evidence for fracture. No worrisome lytic or sclerotic lesion. Sinuses/Orbits: The visualized paranasal sinuses and mastoid air cells are clear. Visualized portions of the globes and intraorbital fat are unremarkable. Other: Soft tissue contusion noted left parietal scalp. CT CERVICAL SPINE FINDINGS Alignment: Straightening of normal cervical lordosis. No subluxation. Skull base and vertebrae: No acute fracture. No primary bone lesion or focal pathologic process. Soft tissues and spinal canal: No prevertebral fluid or swelling. No visible canal hematoma. Disc levels: Loss of disc height noted C2-3 and C5-6. Diffuse facet degeneration noted bilaterally. Upper chest: Centrilobular emphysema with calcific pleuroparenchymal scarring in both apices. Other: None. IMPRESSION: 1. No acute intracranial abnormality. Atrophy with chronic small vessel white matter  ischemic disease. 2. Soft tissue contusion left parietal scalp. 3. Degenerative changes in the cervical spine without fracture. 4. Loss of cervical lordosis. This can be related to patient positioning, muscle spasm or soft tissue injury. Electronically Signed   By: Misty Stanley M.D.   On: 01/05/2021 13:44   DG Chest Port 1 View  Result Date: 01/30/2021 CLINICAL DATA:  Shortness of breath, history of COPD worsening tonight. EXAM: PORTABLE CHEST 1 VIEW COMPARISON:  Chest radiograph January 14, 2021 FINDINGS: The heart size and mediastinal contours are within normal limits. Aortic atherosclerosis. Small calcified granulomas in the right lobe. Both lungs are clear. The visualized skeletal structures are unremarkable. IMPRESSION: No acute cardiopulmonary disease. Electronically Signed   By: Dahlia Bailiff MD   On: 01/30/2021 22:32   DG Chest Portable 1 View  Result Date: 01/14/2021 CLINICAL DATA:  75 year old male with history of shortness of breath and chest pain since yesterday evening. EXAM: PORTABLE CHEST 1 VIEW COMPARISON:  Chest x-ray 01/05/2021. FINDINGS: Lung volumes are normal. No consolidative airspace disease. No pleural effusions. No pneumothorax. Small calcified granulomas in the right upper lobe, similar to the prior study. No other pulmonary nodule or mass noted. Pulmonary vasculature and the cardiomediastinal silhouette are within normal limits. Atherosclerosis in the thoracic aorta. IMPRESSION: 1. No radiographic evidence of acute cardiopulmonary disease. 2. Aortic atherosclerosis. Electronically Signed   By: Vinnie Langton M.D.   On: 01/14/2021 10:45     PERTINENT LAB RESULTS: CBC: Recent Labs    01/30/21 2122  WBC 10.7*  HGB 11.4*  HCT 36.8*  PLT 347   CMET CMP     Component Value Date/Time   NA 140 01/30/2021 2122   NA 144 11/20/2020 1202   K 3.8 01/30/2021 2122   CL 98 01/30/2021 2122   CO2 30 01/30/2021 2122   GLUCOSE 136 (H) 01/30/2021 2122   BUN 17 01/30/2021 2122    BUN 18 11/20/2020 1202   CREATININE 0.68 01/30/2021  2122   CALCIUM 8.9 01/30/2021 2122   PROT 6.7 01/30/2021 2122   PROT 6.3 11/20/2020 1202   ALBUMIN 3.4 (L) 01/30/2021 2122   ALBUMIN 4.1 11/20/2020 1202   AST 23 01/30/2021 2122   ALT 31 01/30/2021 2122   ALKPHOS 93 01/30/2021 2122   BILITOT 0.2 (L) 01/30/2021 2122   BILITOT 0.2 11/20/2020 1202   GFRNONAA >60 01/30/2021 2122   GFRAA 107 11/20/2020 1202    GFR Estimated Creatinine Clearance: 72 mL/min (by C-G formula based on SCr of 0.68 mg/dL). No results for input(s): LIPASE, AMYLASE in the last 72 hours. No results for input(s): CKTOTAL, CKMB, CKMBINDEX, TROPONINI in the last 72 hours. Invalid input(s): POCBNP No results for input(s): DDIMER in the last 72 hours. Recent Labs    01/31/21 1449  HGBA1C 7.2*   No results for input(s): CHOL, HDL, LDLCALC, TRIG, CHOLHDL, LDLDIRECT in the last 72 hours. No results for input(s): TSH, T4TOTAL, T3FREE, THYROIDAB in the last 72 hours.  Invalid input(s): FREET3 No results for input(s): VITAMINB12, FOLATE, FERRITIN, TIBC, IRON, RETICCTPCT in the last 72 hours. Coags: No results for input(s): INR in the last 72 hours.  Invalid input(s): PT Microbiology: No results found for this or any previous visit (from the past 240 hour(s)).  FURTHER DISCHARGE INSTRUCTIONS:  Get Medicines reviewed and adjusted: Please take all your medications with you for your next visit with your Primary MD  Laboratory/radiological data: Please request your Primary MD to go over all hospital tests and procedure/radiological results at the follow up, please ask your Primary MD to get all Hospital records sent to his/her office.  In some cases, they will be blood work, cultures and biopsy results pending at the time of your discharge. Please request that your primary care M.D. goes through all the records of your hospital data and follows up on these results.  Also Note the following: If you experience  worsening of your admission symptoms, develop shortness of breath, life threatening emergency, suicidal or homicidal thoughts you must seek medical attention immediately by calling 911 or calling your MD immediately  if symptoms less severe.  You must read complete instructions/literature along with all the possible adverse reactions/side effects for all the Medicines you take and that have been prescribed to you. Take any new Medicines after you have completely understood and accpet all the possible adverse reactions/side effects.   Do not drive when taking Pain medications or sleeping medications (Benzodaizepines)  Do not take more than prescribed Pain, Sleep and Anxiety Medications. It is not advisable to combine anxiety,sleep and pain medications without talking with your primary care practitioner  Special Instructions: If you have smoked or chewed Tobacco  in the last 2 yrs please stop smoking, stop any regular Alcohol  and or any Recreational drug use.  Wear Seat belts while driving.  Please note: You were cared for by a hospitalist during your hospital stay. Once you are discharged, your primary care physician will handle any further medical issues. Please note that NO REFILLS for any discharge medications will be authorized once you are discharged, as it is imperative that you return to your primary care physician (or establish a relationship with a primary care physician if you do not have one) for your post hospital discharge needs so that they can reassess your need for medications and monitor your lab values.  Total Time spent coordinating discharge including counseling, education and face to face time equals 35 minutes.  Signed: Oren Binet 02/02/2021  10:33 AM

## 2021-02-02 NOTE — Evaluation (Signed)
Physical Therapy Evaluation & Discharge Patient Details Name: Paul [name] MRN: [id number] DOB: Mar 24, 1946 Today's Date: 02/02/2021   History of Present Illness  Pt is a 75 y.o. male who tested (+) COVID-19 on 12/24/20, now admitted 01/30/21 with SOB; workup for acute hypoxic respiratory failure due to COPD exacerbation. PMH includes recent admissions (1/14-1/18 for COVID PNA, 1/23-1/24 for COPD), HF, HTN, PAF, COPD (on home O2), DM2.    Clinical Impression  Patient evaluated by Physical Therapy with no further acute PT needs identified. PTA, pt independent and lives with supportive wife; typically wears 2L O2 [place] overnight. Today, pt moving well with supervision to monitor SpO2. SpO2 down to 83% on RA with activity, maintaining >/87% on 2L O2 [place] (see SATURATIONS QUALIFICATIONS note). Educ re: O2 needs, activity recommendations, energy conservation strategies. All education has been completed and the patient has no further questions. Acute PT is signing off. Thank you for this referral.    Follow Up Recommendations No PT follow up;Supervision - Intermittent    Equipment Recommendations   (home O2)    Recommendations for Other Services       Precautions / Restrictions Precautions Precautions: Other (comment) Precaution Comments: Watch SpO2 Restrictions Weight Bearing Restrictions: No      Mobility  Bed Mobility Overal bed mobility: Modified Independent             General bed mobility comments: HOB elevated    Transfers Overall transfer level: Independent Equipment used: None                Ambulation/Gait Ambulation/Gait assistance: Supervision Gait Distance (Feet): 150 Feet Assistive device: None Gait Pattern/deviations: Step-through pattern;Decreased stride length Gait velocity: Decreased   General Gait Details: Slow, steady, but labored gait with DOE up to 3-4/4 requiring prolonged standing rest break while walking 150' total; additional in room ambulation ~80'  after seated rest. Pt required 2L O2 to maintain >/87%  Stairs            Wheelchair Mobility    Modified Rankin (Stroke Patients Only)       Balance Overall balance assessment: No apparent balance deficits (not formally assessed)                                           Pertinent Vitals/Pain Pain Assessment: No/denies pain    Home Living Family/patient expects to be discharged to:: Private residence Living Arrangements: Spouse/significant other Available Help at Discharge: Family;Available 24 hours/day Type of Home: House Home Access: Level entry     Home Layout: One level Home Equipment: Shower seat - built in;Walker - 4 wheels;Grab bars - tub/shower      Prior Function Level of Independence: Independent         Comments: Independent ambulating community distances; walks on treadmill at gym; used to play golf. Wears 2L O2 [place] at night; owns a portable pulse ox     Hand Dominance        Extremity/Trunk Assessment   Upper Extremity Assessment Upper Extremity Assessment: Overall WFL for tasks assessed    Lower Extremity Assessment Lower Extremity Assessment: Overall WFL for tasks assessed       Communication   Communication: No difficulties  Cognition Arousal/Alertness: Awake/alert Behavior During Therapy: WFL for tasks assessed/performed Overall Cognitive Status: Within Functional Limits for tasks assessed  General Comments General comments (skin integrity, edema, etc.): Educ re: activity recommendations, energy conservation strategies (handout provided), O2 needs    Exercises     Assessment/Plan    PT Assessment Patent does not need any further PT services  PT Problem List         PT Treatment Interventions      PT Goals (Current goals can be found in the Care Plan section)  Acute Rehab PT Goals Patient Stated Goal: Home PT Goal Formulation: All assessment and  education complete, DC therapy    Frequency     Barriers to discharge        Co-evaluation               AM-PAC PT "6 Clicks" Mobility  Outcome Measure Help needed turning from your back to your side while in a flat bed without using bedrails?: None Help needed moving from lying on your back to sitting on the side of a flat bed without using bedrails?: None Help needed moving to and from a bed to a chair (including a wheelchair)?: None Help needed standing up from a chair using your arms (e.g., wheelchair or bedside chair)?: None Help needed to walk in hospital room?: None Help needed climbing 3-5 steps with a railing? : A Little 6 Click Score: 23    End of Session Equipment Utilized During Treatment: Oxygen Activity Tolerance: Patient tolerated treatment well Patient left: in chair;with call bell/phone within reach Nurse Communication: Mobility status PT Visit Diagnosis: Other abnormalities of gait and mobility (R26.89)    Time: 4431-5400 PT Time Calculation (min) (ACUTE ONLY): 16 min   Charges:   PT Evaluation $PT Eval Moderate Complexity: [place], PT, DPT Acute Rehabilitation Services  Pager [phone] Office [place] 02/02/2021, 10:41 AM

## 2021-02-02 NOTE — Plan of Care (Signed)
  Problem: Clinical Measurements: Goal: Ability to maintain clinical measurements within normal limits will improve Outcome: Progressing Goal: Will remain free from infection Outcome: Progressing Goal: Respiratory complications will improve Outcome: Progressing Goal: Cardiovascular complication will be avoided Outcome: Progressing   Problem: Activity: Goal: Risk for activity intolerance will decrease Outcome: Progressing   Problem: Nutrition: Goal: Adequate nutrition will be maintained Outcome: Progressing   Problem: Safety: Goal: Ability to remain free from injury will improve Outcome: Progressing

## 2021-02-02 NOTE — Progress Notes (Signed)
SATURATION QUALIFICATIONS: (This note is used to comply with regulatory documentation for home oxygen)  Patient Saturations on Room Air at Rest = 94%  Patient Saturations on Room Air while Ambulating = 83%  Patient Saturations on 2 Liters of oxygen while Ambulating = 87%  Please briefly explain why patient needs home oxygen: Patient requires supplemental O2 to maintain appropriate saturations with activity.  Mabeline Caras, PT, DPT Acute Rehabilitation Services  Pager [phone] Office [phone]

## 2021-02-04 ENCOUNTER — Encounter: Payer: Self-pay | Admitting: Family Medicine

## 2021-02-05 ENCOUNTER — Inpatient Hospital Stay: Payer: Medicare Other | Admitting: Acute Care

## 2021-02-05 ENCOUNTER — Encounter: Payer: Self-pay | Admitting: Adult Health

## 2021-02-05 ENCOUNTER — Other Ambulatory Visit: Payer: Self-pay

## 2021-02-05 ENCOUNTER — Telehealth: Payer: Self-pay

## 2021-02-05 ENCOUNTER — Ambulatory Visit (INDEPENDENT_AMBULATORY_CARE_PROVIDER_SITE_OTHER): Payer: Medicare Other | Admitting: Adult Health

## 2021-02-05 DIAGNOSIS — J9611 Chronic respiratory failure with hypoxia: Secondary | ICD-10-CM

## 2021-02-05 DIAGNOSIS — R5381 Other malaise: Secondary | ICD-10-CM | POA: Diagnosis not present

## 2021-02-05 DIAGNOSIS — J441 Chronic obstructive pulmonary disease with (acute) exacerbation: Secondary | ICD-10-CM | POA: Diagnosis not present

## 2021-02-05 MED ORDER — SPIRIVA RESPIMAT 2.5 MCG/ACT IN AERS: 2.0000 | INHALATION_SPRAY | Freq: Every day | RESPIRATORY_TRACT | 0 refills | Status: DC

## 2021-02-05 NOTE — Progress Notes (Signed)
@Patient  ID: Paul [name], male    DOB: [birth year], 75 y.o.   MRN: [id number]  Chief Complaint  Patient presents with  . Hospitalization Follow-up    Referring provider: [name], MD  HPI: 75 year old male former smoker quit in 2010 followed for severe gold 3 COPD and chronic respiratory failure on oxygen.  Patient is prone to recurrent COPD exacerbations hospitalizations Medical history significant for A. fib Had COVID-19 infection in January 2022 requiring hospitalization (treated with IV steroids discharged on a prednisone taper)  TEST/EVENTS :  05/29/16: FVC 2.08 L (86%) FEV1 0.83 L (31%) FEV1/FVC 0.40FEF 25-75 0.30 L (14%) no bronchodilator response TLC 6.56 L (107%) RV 167% ERV 160% DLCO corrected 60% (hemoglobin 10.9) 07/15/13: FVC 2.68 L (60%) FEV1 1.63 L (53%) FEV1/FVC 0.61 FEF 25-75 0.76 L (26%) 02/04/12: FVC 2.18 L (57%) FEV1 1.09 L (36%) FEV1/FVC 0.50 FEF 25-75 0.33 L (11%)   CT sinus February 2021 no acute sinus disease noted CT chest February 2021 emphysematous changes, several old thoracic compression fractures  2D echo July 2020 EF 60-65%,  Palliative care started 2021 (nurse comes every 2 weeks)   02/05/2021 Follow up : COPD , O2 RF, Covid 19 , post hospital follow-up Patient returns for a follow-up. Patient was hospitalized last week for COPD exacerbation.  Hospital records were reviewed.  Chest x-ray showed no sign of pneumonia.  Felt to have a significant component of anxiety.  Patient was treated with IV steroids and discharged on a slow prednisone taper. Patient has been having difficulty since last month after being hospitalized for COVID-19 infection, Covid pneumonia and COPD exacerbation.  During hospitalization for Covid he did receive remdesivir and IV steroids.  Shortly after initial hospitalization.  Patient had a syncopal episode with subsequent head injury and laceration requiring staples.  He did continue to have ongoing symptoms with COPD  exacerbation and pneumonia.  He was treated with antibiotics.  Had an adverse reaction to Levaquin and was changed over to doxycycline.  Chest x-ray showed clearance of pneumonia. Since discharge patient is feeling some better but still feels very weak.  Gets short of breath with minimal activity.  Sometimes is short of breath just at rest.  Has trouble getting dressed, eating, and ADLs.  Has been hospitalized 3 times in the last month. He has been started on Zoloft for anxiety, he says anxiety is very difficult for him as he gets more anxious his breathing gets worse. Chest x-ray January 30, 2021 showed clear lungs.. Patient is currently on prednisone 40 mg daily.  He remains on oxygen 2 L.  O2 saturations are 97% on 2 L.  Has 1 day left of doxycycline.  He is followed by palliative care and a nurse comes every 2 weeks   Allergies  Allergen Reactions  . Daliresp [Roflumilast] Diarrhea and Nausea And Vomiting  . Alendronate Anxiety and Other (See Comments)    Jittery/nervous  . Levaquin [Levofloxacin] Palpitations and Other (See Comments)    Made the B/P fluctuate and heart raced    Immunization History  Administered Date(s) Administered  . Fluad Quad(high Dose 65+) 10/02/2020  . Influenza Split 09/22/2013, 09/01/2015  . Influenza Whole 09/04/2011, 09/21/2012  . Influenza, High Dose Seasonal PF 09/02/2016, 09/10/2017, 08/31/2018, 09/24/2018, 10/21/2019  . Influenza-Unspecified 08/23/2014  . Moderna Sars-Covid-2 Vaccination 01/17/2020, 02/14/2020, 10/06/2020  . Pneumococcal Conjugate-13 01/11/2014, 12/09/2016  . Pneumococcal Polysaccharide-23 08/23/2010, 08/29/2011  . Zoster Recombinat (Shingrix) 04/04/2014    Past Medical History:  Diagnosis Date  .  A-fib ([place])   . Anemia   . Aortic atherosclerosis ([place])   . Atrial fibrillation ([place])   . Back pain   . COPD (chronic obstructive pulmonary disease) ([place])   . Diabetes mellitus without complication ([place])   . Elevated PSA   . GAD  (generalized anxiety disorder)   . High cholesterol   . Hypertension   . Osteoporosis   . Oxygen deficiency     Tobacco History: Social History   Tobacco Use  Smoking Status Former Smoker  . Packs/day: 2.00  . Years: 52.00  . Pack years: 104.00  . Types: Cigarettes  . Quit date: 02/03/2009  . Years since quitting: 12.0  Smokeless Tobacco Never Used  Tobacco Comment   Counseled to remain smoke free   Counseling given: Not Answered Comment: Counseled to remain smoke free   Outpatient Medications Prior to Visit  Medication Sig Dispense Refill  . acetaminophen (TYLENOL) 325 MG tablet Take 2 tablets (650 mg total) by mouth every 6 (six) hours as needed for mild pain or headache (fever >/= 101).    Marland Kitchen albuterol (PROAIR HFA) 108 (90 Base) MCG/ACT inhaler Inhale 2 puffs into the lungs every 6 (six) hours as needed for wheezing or shortness of breath.     Marland Kitchen albuterol (PROVENTIL) (2.5 MG/3ML) 0.083% nebulizer solution Take 3 mLs (2.5 mg total) by nebulization in the morning and at bedtime. (Patient taking differently: Take 2.5 mg by nebulization 4 (four) times daily.) 75 mL 6  . apixaban (ELIQUIS) 5 MG TABS tablet Take 1 tablet (5 mg total) by mouth 2 (two) times daily. 180 tablet 1  . ascorbic acid (VITAMIN C) 500 MG tablet Take 1 tablet (500 mg total) by mouth daily. (Patient taking differently: Take 1,000 mg by mouth daily.)    . atorvastatin (LIPITOR) 40 MG tablet TAKE 1 TABLET(40 MG) BY MOUTH DAILY AFTER SUPPER (Patient taking differently: Take 40 mg by mouth at bedtime.) 90 tablet 3  . bisoprolol (ZEBETA) 5 MG tablet Take 1 tablet (5 mg total) by mouth daily. 90 tablet 3  . budesonide-formoterol (SYMBICORT) 160-4.5 MCG/ACT inhaler Inhale 2 puffs into the lungs 2 (two) times daily. 1 Inhaler 3  . calcium carbonate (OS-CAL) 600 MG TABS tablet Take 600 mg by mouth daily.    . Cholecalciferol (VITAMIN D) 50 MCG (2000 UT) CAPS Take 2,000 Units by mouth daily.    Marland Kitchen denosumab (PROLIA) 60  MG/ML SOSY injection Inject 60 mg into the skin every 6 (six) months.    . diltiazem (CARDIZEM CD) 120 MG 24 hr capsule Take 1 capsule (120 mg total) by mouth daily. 90 capsule 3  . diltiazem (CARDIZEM) 60 MG tablet Take 1 tablet (60 mg total) by mouth 4 (four) times daily as needed. (Patient taking differently: Take 60 mg by mouth 4 (four) times daily as needed (Blood Pressure).) 30 tablet prn  . doxycycline (VIBRA-TABS) 100 MG tablet Take 1 tablet (100 mg total) by mouth every 12 (twelve) hours for 3 days. 6 tablet 0  . furosemide (LASIX) 40 MG tablet Take 1 tablet (40 mg total) by mouth daily. 90 tablet 3  . guaiFENesin (MUCINEX) 600 MG 12 hr tablet Take 1,200 mg by mouth 2 (two) times daily.    Marland Kitchen loratadine (CLARITIN) 10 MG tablet Take 10 mg by mouth in the morning.    . Melatonin 5 MG TABS Take 5 mg by mouth at bedtime.    . Multiple Vitamin (MULTIVITAMIN WITH MINERALS) TABS tablet Take 1  tablet by mouth daily.    . Niacinamide-Zn-Cu-Methfo-Se-Cr (NICOTINAMIDE PO) Take 1 tablet by mouth in the morning and at bedtime.    Marland Kitchen omeprazole (PRILOSEC OTC) 20 MG tablet Take 20 mg by mouth 2 (two) times daily before a meal.    . OXYGEN Inhale 2-2.5 L/min into the lungs See admin instructions. Inhale 2 L/min of oxygen into the lungs at bedtime and when ambulatory    . predniSONE (DELTASONE) 10 MG tablet Take 40 mg p.o. daily for 3 days, then 30 mg p.o. daily for 3 days, then 20 mg p.o. daily for 3 days, then 10 mg p.o. daily for 3 days and stop. 30 tablet 0  . sertraline (ZOLOFT) 25 MG tablet Take 1 tablet (25 mg total) by mouth at bedtime. 90 tablet 0  . sodium chloride (OCEAN) 0.65 % nasal spray Place 1 spray into the nose as needed for congestion.    . Tiotropium Bromide Monohydrate (SPIRIVA RESPIMAT) 2.5 MCG/ACT AERS Inhale 2 puffs into the lungs daily. 4 g 0   No facility-administered medications prior to visit.     Review of Systems:   Constitutional:   No  weight loss, night sweats,  Fevers,  chills, + fatigue, or  lassitude.  HEENT:   No headaches,  Difficulty swallowing,  Tooth/dental problems, or  Sore throat,                No sneezing, itching, ear ache, nasal congestion, post nasal drip,   + CV:  No chest pain,  Orthopnea, PND, swelling in lower extremities, anasarca, dizziness, palpitations, syncope.   GI  No heartburn, indigestion, abdominal pain, nausea, vomiting, diarrhea, change in bowel habits, loss of appetite, bloody stools.   Resp:    No chest wall deformity  Skin: no rash or lesions.  GU: no dysuria, change in color of urine, no urgency or frequency.  No flank pain, no hematuria   MS:  No joint pain or swelling.  No decreased range of motion.  No back pain.    Physical Exam  BP 130/66 (BP Location: Right Arm, Patient Position: Sitting, Cuff Size: Normal)   Pulse 87   Temp 98.1 F (36.7 C) (Temporal)   Ht 5\' 6"  (1.676 m)   Wt 151 lb 12.8 oz (68.9 kg)   SpO2 97%   BMI 24.50 kg/m   GEN: A/Ox3; pleasant , NAD, elderly, chronically ill-appearing, on oxygen , HEENT:  [place]/AT,   NOSE-clear, THROAT-clear, no lesions, no postnasal drip or exudate noted.   NECK:  Supple w/ fair ROM; no JVD; normal carotid impulses w/o bruits; no thyromegaly or nodules palpated; no lymphadenopathy.    RESP decreased breath sounds in the bases  no accessory muscle use, no dullness to percussion  CARD:  RRR, no m/r/g, no peripheral edema, pulses intact, no cyanosis or clubbing.  GI:   Soft & nt; nml bowel sounds; no organomegaly or masses detected.   Musco: Warm bil, no deformities or joint swelling noted.   Neuro: alert, no focal deficits noted.    Skin: Warm, no lesions or rashes    Lab Results:  CBC    Component Value Date/Time   WBC 10.7 (H) 01/30/2021 2122   RBC 4.52 01/30/2021 2122   HGB 11.4 (L) 01/30/2021 2122   HGB 11.6 (L) 11/20/2020 1202   HCT 36.8 (L) 01/30/2021 2122   HCT 36.9 (L) 11/20/2020 1202   PLT 347 01/30/2021 2122   PLT 322 11/20/2020  1202   MCV  81.4 01/30/2021 2122   MCV 79 11/20/2020 1202   MCH 25.2 (L) 01/30/2021 2122   MCHC 31.0 01/30/2021 2122   RDW 17.0 (H) 01/30/2021 2122   RDW 14.6 11/20/2020 1202   LYMPHSABS 0.5 (L) 01/15/2021 0423   LYMPHSABS 3.5 (H) 11/20/2020 1202   MONOABS 0.3 01/15/2021 0423   EOSABS 0.0 01/15/2021 0423   EOSABS 0.1 11/20/2020 1202   BASOSABS 0.0 01/15/2021 0423   BASOSABS 0.0 11/20/2020 1202    BMET    Component Value Date/Time   NA 140 01/30/2021 2122   NA 144 11/20/2020 1202   K 3.8 01/30/2021 2122   CL 98 01/30/2021 2122   CO2 30 01/30/2021 2122   GLUCOSE 136 (H) 01/30/2021 2122   BUN 17 01/30/2021 2122   BUN 18 11/20/2020 1202   CREATININE 0.68 01/30/2021 2122   CALCIUM 8.9 01/30/2021 2122   GFRNONAA >60 01/30/2021 2122   GFRAA 107 11/20/2020 1202    BNP    Component Value Date/Time   BNP 90.9 01/30/2021 2122    ProBNP No results found for: PROBNP  Imaging: DG Chest Port 1 View  Result Date: 01/30/2021 CLINICAL DATA:  Shortness of breath, history of COPD worsening tonight. EXAM: PORTABLE CHEST 1 VIEW COMPARISON:  Chest radiograph January 14, 2021 FINDINGS: The heart size and mediastinal contours are within normal limits. Aortic atherosclerosis. Small calcified granulomas in the right lobe. Both lungs are clear. The visualized skeletal structures are unremarkable. IMPRESSION: No acute cardiopulmonary disease. Electronically Signed   By: Dahlia Bailiff MD   On: 01/30/2021 22:32   DG Chest Portable 1 View  Result Date: 01/14/2021 CLINICAL DATA:  75 year old male with history of shortness of breath and chest pain since yesterday evening. EXAM: PORTABLE CHEST 1 VIEW COMPARISON:  Chest x-ray 01/05/2021. FINDINGS: Lung volumes are normal. No consolidative airspace disease. No pleural effusions. No pneumothorax. Small calcified granulomas in the right upper lobe, similar to the prior study. No other pulmonary nodule or mass noted. Pulmonary vasculature and the  cardiomediastinal silhouette are within normal limits. Atherosclerosis in the thoracic aorta. IMPRESSION: 1. No radiographic evidence of acute cardiopulmonary disease. 2. Aortic atherosclerosis. Electronically Signed   By: Vinnie Langton M.D.   On: 01/14/2021 10:45      PFT Results Latest Ref Rng & Units 05/29/2016  FVC-Pre L 1.92  FVC-Predicted Pre % 52  Pre FEV1/FVC % % 41  FEV1-Pre L 0.78  FEV1-Predicted Pre % 29    No results found for: NITRICOXIDE      Assessment & Plan:   COPD exacerbation ([place]) Patient is prone to recurrent exacerbations.  Has had a slow to resolve exacerbation over the last month since COVID-19.  Patient has significant physical deconditioning.  Also has underlying anxiety that are contributing factors.  He would benefit from pulmonary rehab as he has low activity tolerance. Recent hospitalization showed no sign of pneumonia.  For now will slowly taper prednisone down and hold at 20 mg.  Previously has been intolerant to [place].  On return visit discussed possible addition of adding Zithromax 3 times a week. Continue with aggressive pulmonary hygiene.  Continue on Symbicort and Spiriva  Plan  Patient Instructions  Finish Doxycycline as directed.  Decrease Prednisone dose by 2.5mg  every 5 days until at  Prednisone 20mg  daily -hold at this dose.  Continue on Symbicort 2 puffs Twice daily   Continue on Spiriva daily  Mucinex Twice daily  As needed  Cough/congestion  Flutter valve Three times a  day  .  Albuterol Neb Twice daily  -use the precautions we discussed . Wear Oxygen 2l/m at rest and 3l/m with activity to maintain O2 sats >88-90%.  Activity as tolerated.  Breathing exercises as we discussed  Follow up with Dr. Tobie Poet for Anxiety  Refer to Pulmonary Rehab at [facility]  Please contact office for sooner follow up if symptoms do not improve or worsen or seek emergency care  Follow up with Dr. Melvyn Novas  Or Mansur Patti NP in 2  weeks  and As needed         Chronic respiratory failure with hypoxia ([place]) Continue on oxygen 2 L at rest and 3 L with activity.  Physical deconditioning Patient has significant physical deconditioning.  Low activity tolerance peer refer to pulmonary rehab.  Patient does live in [place] and needs to be referred to [place], [place] 02/05/2021

## 2021-02-05 NOTE — Assessment & Plan Note (Signed)
Patient has significant physical deconditioning.  Low activity tolerance peer refer to pulmonary rehab.  Patient does live in [place] and needs to be referred to [facility]

## 2021-02-05 NOTE — Assessment & Plan Note (Signed)
Continue on oxygen 2 L at rest and 3 L with activity. 

## 2021-02-05 NOTE — Telephone Encounter (Signed)
Transition Care Management Unsuccessful Follow-up Telephone Call  Date of discharge and from where:  02/02/21  Attempts:  Pt has already had Follow up visit with Pulmonology

## 2021-02-05 NOTE — Addendum Note (Signed)
Addended by: Vanessa Barbara on: 02/05/2021 10:41 AM   Modules accepted: Orders

## 2021-02-05 NOTE — Assessment & Plan Note (Signed)
Patient is prone to recurrent exacerbations.  Has had a slow to resolve exacerbation over the last month since COVID-19.  Patient has significant physical deconditioning.  Also has underlying anxiety that are contributing factors.  He would benefit from pulmonary rehab as he has low activity tolerance. Recent hospitalization showed no sign of pneumonia.  For now will slowly taper prednisone down and hold at 20 mg.  Previously has been intolerant to [place].  On return visit discussed possible addition of adding Zithromax 3 times a week. Continue with aggressive pulmonary hygiene.  Continue on Symbicort and Spiriva  Plan  Patient Instructions  Finish Doxycycline as directed.  Decrease Prednisone dose by 2.5mg  every 5 days until at  Prednisone 20mg  daily -hold at this dose.  Continue on Symbicort 2 puffs Twice daily   Continue on Spiriva daily  Mucinex Twice daily  As needed  Cough/congestion  Flutter valve Three times a day  .  Albuterol Neb Twice daily  -use the precautions we discussed . Wear Oxygen 2l/m at rest and 3l/m with activity to maintain O2 sats >88-90%.  Activity as tolerated.  Breathing exercises as we discussed  Follow up with Dr. Tobie Poet for Anxiety  Refer to Pulmonary Rehab at [facility]  Please contact office for sooner follow up if symptoms do not improve or worsen or seek emergency care  Follow up with Dr. Melvyn Novas  Or Kyros Salzwedel NP in 2  weeks  and As needed

## 2021-02-05 NOTE — Addendum Note (Signed)
Addended by: Vanessa Barbara on: 02/05/2021 09:54 AM   Modules accepted: Orders

## 2021-02-05 NOTE — Patient Instructions (Signed)
Finish Doxycycline as directed.  Decrease Prednisone dose by 2.5mg  every 5 days until at  Prednisone 20mg  daily -hold at this dose.  Continue on Symbicort 2 puffs Twice daily   Continue on Spiriva daily  Mucinex Twice daily  As needed  Cough/congestion  Flutter valve Three times a day  .  Albuterol Neb Twice daily  -use the precautions we discussed . Wear Oxygen 2l/m at rest and 3l/m with activity to maintain O2 sats >88-90%.  Activity as tolerated.  Breathing exercises as we discussed  Follow up with Dr. Tobie Poet for Anxiety  Refer to Pulmonary Rehab at [facility]  Please contact office for sooner follow up if symptoms do not improve or worsen or seek emergency care  Follow up with Dr. Melvyn Novas  Or Sherese Heyward NP in 2  weeks  and As needed

## 2021-02-06 ENCOUNTER — Ambulatory Visit: Payer: Self-pay

## 2021-02-06 NOTE — Chronic Care Management (AMB) (Signed)
   02/06/2021  Estefan Pattison 10/23/1946 [id number]   Reviewed chart for purpose of calling to offer Care Management services. Patient on high risk list due to recent admission for Covid pneumonia and COPD.  Placed call to patient and explained reason for call. Patient request I speak with wife.  I spoke with Ms. Mcgue who reports to me that the patient is doing better.  Reports pulmonary follow up yesterday and referral to Pulmonary Rehab.  Patient went to pulmonary rehab today for orientation. Will start rehab next week.   Reviewed follow up planned with Dr. Tobie Poet on 02/08/2021.  Patient and wife want to discuss anxiety.  Reports recently started on medications.  Active with  Palliative care. Last seen on 02/06/2021,  Will be seen weekly by RN.   I reviewed and offered Care management services and wife has declined and states that she and patient understand COPD, recognize symptoms, and understand patients emergency plan.   DME at home: neb machine                         Oxygen                         Programme researcher, broadcasting/film/video.  PLAN: declines needs at this time. Will send an in basket message to MD to inform.  Tomasa Rand, RN, BSN, [facility] Care Management  Coordinator [facility] [phone]

## 2021-02-07 NOTE — Progress Notes (Signed)
Subjective:  Patient ID: Paul [name], male    DOB: [birth year]  Age: 75 y.o. MRN: [id number]  Chief Complaint  Patient presents with  . 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 fib on Eliquis-recent hospitalizations-presenting with shortness of breath-thought to have acute hypoxic respiratory failure due to COVID-19 pneumonia.  Acute on chronic hypoxic respiratory failure due to COPD exacerbation: Has improved-significant component of anxiety as well-he is on room air at rest-but with ambulation does desaturate.  Since overall improved-we will place him on tapering prednisone BY 2.5 MG  Every 3-4 days and is currently on 37.5 mg daily. - resume his other usual inhalers-and have made him an appointment with pulmonology on 2/14.  Patient to use home O2 24/7 until seen by PCCM.  Anxiety: Currently on zoloft 25 mg once daily started while in the hospital.  Pt becomes very anxious when pt starts to have respiratory distress. At the time he uses albuterol nebulizers and then his palpitations and anxiety increase.   Current Outpatient Medications on File Prior to Visit  Medication Sig Dispense Refill  . acetaminophen (TYLENOL) 325 MG tablet Take 2 tablets (650 mg total) by mouth every 6 (six) hours as needed for mild pain or headache (fever >/= 101).    Marland Kitchen albuterol (PROAIR HFA) 108 (90 Base) MCG/ACT inhaler Inhale 2 puffs into the lungs every 6 (six) hours as needed for wheezing or shortness of breath.     Marland Kitchen albuterol (PROVENTIL) (2.5 MG/3ML) 0.083% nebulizer solution Take 3 mLs (2.5 mg total) by nebulization in the morning and at bedtime. (Patient taking differently: Take 2.5 mg by nebulization 4 (four) times daily.) 75 mL 6  . apixaban (ELIQUIS) 5 MG TABS tablet Take 1 tablet (5 mg total) by mouth 2 (two) times daily. 180 tablet 1  . ascorbic acid (VITAMIN C) 500 MG tablet Take 1 tablet (500  mg total) by mouth daily. (Patient taking differently: Take 1,000 mg by mouth daily.)    . atorvastatin (LIPITOR) 40 MG tablet TAKE 1 TABLET(40 MG) BY MOUTH DAILY AFTER SUPPER (Patient taking differently: Take 40 mg by mouth at bedtime.) 90 tablet 3  . bisoprolol (ZEBETA) 5 MG tablet Take 1 tablet (5 mg total) by mouth daily. 90 tablet 3  . budesonide-formoterol (SYMBICORT) 160-4.5 MCG/ACT inhaler Inhale 2 puffs into the lungs 2 (two) times daily. 1 Inhaler 3  . calcium carbonate (OS-CAL) 600 MG TABS tablet Take 600 mg by mouth daily.    . Cholecalciferol (VITAMIN D) 50 MCG (2000 UT) CAPS Take 2,000 Units by mouth daily.    Marland Kitchen denosumab (PROLIA) 60 MG/ML SOSY injection Inject 60 mg into the skin every 6 (six) months.    . diltiazem (CARDIZEM CD) 120 MG 24 hr capsule Take 1 capsule (120 mg total) by mouth daily. 90 capsule 3  . diltiazem (CARDIZEM) 60 MG tablet Take 1 tablet (60 mg total) by mouth 4 (four) times daily as needed. (Patient taking differently: Take 60 mg by mouth 4 (four) times daily as needed (Blood Pressure).) 30 tablet prn  . furosemide (LASIX) 40 MG tablet Take 1 tablet (40 mg total) by mouth daily. 90 tablet 3  . guaiFENesin (MUCINEX) 600 MG 12 hr tablet Take 1,200 mg by mouth 2 (two) times daily.    Marland Kitchen loratadine (CLARITIN) 10 MG tablet Take 10 mg by mouth in the morning.    Marland Kitchen  Melatonin 5 MG TABS Take 5 mg by mouth at bedtime.    . Multiple Vitamin (MULTIVITAMIN WITH MINERALS) TABS tablet Take 1 tablet by mouth daily.    . Niacinamide-Zn-Cu-Methfo-Se-Cr (NICOTINAMIDE PO) Take 1 tablet by mouth in the morning and at bedtime.    Marland Kitchen omeprazole (PRILOSEC OTC) 20 MG tablet Take 20 mg by mouth 2 (two) times daily before a meal.    . OXYGEN Inhale 2-2.5 L/min into the lungs See admin instructions. Inhale 2 L/min of oxygen into the lungs at bedtime and when ambulatory    . predniSONE (DELTASONE) 10 MG tablet Take 40 mg p.o. daily for 3 days, then 30 mg p.o. daily for 3 days, then 20 mg p.o.  daily for 3 days, then 10 mg p.o. daily for 3 days and stop. 30 tablet 0  . sodium chloride (OCEAN) 0.65 % nasal spray Place 1 spray into the nose as needed for congestion.    . Tiotropium Bromide Monohydrate (SPIRIVA RESPIMAT) 2.5 MCG/ACT AERS Inhale 2 puffs into the lungs daily. 4 g 0   No current facility-administered medications on file prior to visit.   Past Medical History:  Diagnosis Date  . A-fib ([place])   . Anemia   . Aortic atherosclerosis ([place])   . Atrial fibrillation ([place])   . Back pain   . COPD (chronic obstructive pulmonary disease) ([place])   . Diabetes mellitus without complication ([place])   . Elevated PSA   . GAD (generalized anxiety disorder)   . High cholesterol   . Hypertension   . Osteoporosis   . Oxygen deficiency    Past Surgical History:  Procedure Laterality Date  . CATARACT EXTRACTION Right   . SKIN SURGERY     shoulders and chest    Family History  Problem Relation Age of Onset  . Heart disease Brother   . Heart disease Mother   . Heart disease Father   . Cancer Paternal Uncle    Social History   Socioeconomic History  . Marital status: Married    Spouse name: Not on file  . Number of children: 3  . Years of education: Not on file  . Highest education level: Not on file  Occupational History  . Occupation: retired    Comment: truck Geophysicist/field seismologist  Tobacco Use  . Smoking status: Former Smoker    Packs/day: 2.00    Years: 52.00    Pack years: 104.00    Types: Cigarettes    Quit date: 02/03/2009    Years since quitting: 12.0  . Smokeless tobacco: Never Used  . Tobacco comment: Counseled to remain smoke free  Vaping Use  . Vaping Use: Never used  Substance and Sexual Activity  . Alcohol use: Not Currently    Alcohol/week: 0.0 standard drinks    Comment: occ  . Drug use: No  . Sexual activity: Not on file  Other Topics Concern  . Not on file  Social History Narrative   Originally from [place]. Previously worked driving a Restaurant manager, fast food. No pets  currently. No mold exposure. 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       wears sunscreen, brushes and flosses daily, see's dentist bi-annually, has smoke/carbon monoxide detectors, wears a seatbelt and practices gun safety      Social Determinants of Health   Financial Resource Strain: Not on file  Food Insecurity: No Food Insecurity  . Worried About [employer] of  Food in the Last Year: Never true  . Ran Out of Food in the Last Year: Never true  Transportation Needs: No Transportation Needs  . Lack of Transportation (Medical): No  . Lack of Transportation (Non-Medical): No  Physical Activity: Not on file  Stress: Not on file  Social Connections: Not on file    Review of Systems  Constitutional: Negative for chills, diaphoresis, fatigue and fever.  HENT: Negative for congestion, ear pain and sore throat.   Respiratory: Positive for cough and shortness of breath.   Cardiovascular: Positive for palpitations. Negative for chest pain and leg swelling.  Gastrointestinal: Negative for abdominal pain, constipation, diarrhea, nausea and vomiting.  Genitourinary: Negative for dysuria and urgency.  Musculoskeletal: Negative for arthralgias and myalgias.  Neurological: Positive for weakness and light-headedness. Negative for dizziness and headaches.  Psychiatric/Behavioral: Negative for dysphoric mood. The patient is nervous/anxious.     Objective:  BP 140/60   Pulse 80   Temp (!) 97.1 F (36.2 C)   Resp 16   Ht 5\' 6"  (1.676 m)   Wt 150 lb (68 kg)   BMI 24.21 kg/m   BP/Weight 02/08/2021 02/05/2021 2/99/3716  Systolic BP 967 893 810  Diastolic BP 60 66 77  Wt. (Lbs) 150 151.8 -  BMI 24.21 24.5 -    Physical Exam Vitals reviewed.  Constitutional:      Appearance: Normal appearance. He is normal weight.  Neck:     Vascular: No carotid bruit.  Cardiovascular:     Rate and Rhythm: Normal rate and regular rhythm.      Heart sounds: No murmur heard.   Pulmonary:     Effort: Pulmonary effort is normal.     Breath sounds: Normal breath sounds.  Abdominal:     General: Abdomen is flat. Bowel sounds are normal.     Palpations: Abdomen is soft.     Tenderness: There is no abdominal tenderness.  Neurological:     Mental Status: He is alert and oriented to person, place, and time.  Psychiatric:        Mood and Affect: Mood normal.        Behavior: Behavior normal.     Diabetic Foot Exam - Simple   No data filed      Lab Results  Component Value Date   WBC 10.7 (H) 01/30/2021   HGB 11.4 (L) 01/30/2021   HCT 36.8 (L) 01/30/2021   PLT 347 01/30/2021   GLUCOSE 136 (H) 01/30/2021   CHOL 172 11/20/2020   TRIG 62 12/25/2020   HDL 60 11/20/2020   LDLCALC 90 11/20/2020   ALT 31 01/30/2021   AST 23 01/30/2021   NA 140 01/30/2021   K 3.8 01/30/2021   CL 98 01/30/2021   CREATININE 0.68 01/30/2021   BUN 17 01/30/2021   CO2 30 01/30/2021   TSH 2.539 03/22/2019   INR 1.4 (H) 12/24/2020   HGBA1C 7.2 (H) 01/31/2021      Assessment & Plan:   1. Chronic respiratory failure with hypoxia (HCC)  Continue oxygen.  2. Chronic atrial fibrillation (HCC)  Well controlled. Continue eliquis.5 mg one twice daily.   3. COPD GOLD  III   Continue symbicort and spiriva.   Pt has enough for about 6 month.   Consider change to breztri at that time because we can get improved pt assistance.  4. Acquired thrombophilia ([place])  Secondary to eliquis.   5. Depression, major, single episode, moderate (HCC)  Continue zoloft 50 mg  once daily.   6. GAD (generalized anxiety disorder)   Alprazolam 0.25 mg I bid prn severe anxiety.   Recommend change albuterol nebulizer due to xopenex nebulizer treatments.   7. Palpitations:   Recommend change albuterol nebulizer due to xopenex nebulizer treatments.  Meds ordered this encounter  Medications  . ALPRAZolam (XANAX) 0.25 MG tablet    Sig: Take 1 tablet (0.25 mg  total) by mouth 2 (two) times daily as needed for anxiety.    Dispense:  60 tablet    Refill:  0  . sertraline (ZOLOFT) 50 MG tablet    Sig: Take 1 tablet (50 mg total) by mouth daily.    Dispense:  30 tablet    Refill:  3  . levalbuterol (XOPENEX) 1.25 MG/3ML nebulizer solution    Sig: Take 1.25 mg by nebulization every 6 (six) hours as needed for wheezing.    Dispense:  120 mL    Refill:  3   Follow-up: Return if symptoms worsen or fail to improve.  An After Visit Summary was printed and given to the patient.  [name], MD [facility] [phone]

## 2021-02-08 ENCOUNTER — Ambulatory Visit (INDEPENDENT_AMBULATORY_CARE_PROVIDER_SITE_OTHER): Payer: Medicare Other | Admitting: Family Medicine

## 2021-02-08 ENCOUNTER — Ambulatory Visit (INDEPENDENT_AMBULATORY_CARE_PROVIDER_SITE_OTHER): Payer: Medicare Other

## 2021-02-08 ENCOUNTER — Other Ambulatory Visit: Payer: Self-pay

## 2021-02-08 VITALS — BP 140/60 | HR 80 | Temp 97.1°F | Resp 16 | Ht 66.0 in | Wt 150.0 lb

## 2021-02-08 DIAGNOSIS — J9611 Chronic respiratory failure with hypoxia: Secondary | ICD-10-CM

## 2021-02-08 DIAGNOSIS — R002 Palpitations: Secondary | ICD-10-CM

## 2021-02-08 DIAGNOSIS — I482 Chronic atrial fibrillation, unspecified: Secondary | ICD-10-CM | POA: Diagnosis not present

## 2021-02-08 DIAGNOSIS — J449 Chronic obstructive pulmonary disease, unspecified: Secondary | ICD-10-CM

## 2021-02-08 DIAGNOSIS — F321 Major depressive disorder, single episode, moderate: Secondary | ICD-10-CM | POA: Diagnosis not present

## 2021-02-08 DIAGNOSIS — D6869 Other thrombophilia: Secondary | ICD-10-CM

## 2021-02-08 DIAGNOSIS — F411 Generalized anxiety disorder: Secondary | ICD-10-CM | POA: Diagnosis not present

## 2021-02-08 DIAGNOSIS — E1169 Type 2 diabetes mellitus with other specified complication: Secondary | ICD-10-CM

## 2021-02-08 DIAGNOSIS — E782 Mixed hyperlipidemia: Secondary | ICD-10-CM | POA: Diagnosis not present

## 2021-02-08 MED ORDER — ALPRAZOLAM 0.25 MG PO TABS: 0.2500 mg | ORAL_TABLET | Freq: Two times a day (BID) | ORAL | 0 refills | Status: DC | PRN

## 2021-02-08 MED ORDER — SERTRALINE HCL 50 MG PO TABS: 50.0000 mg | ORAL_TABLET | Freq: Every day | ORAL | 3 refills | Status: DC

## 2021-02-08 MED ORDER — LEVALBUTEROL HCL 1.25 MG/3ML IN NEBU: 1.2500 mg | INHALATION_SOLUTION | Freq: Four times a day (QID) | RESPIRATORY_TRACT | 3 refills | Status: DC | PRN

## 2021-02-08 NOTE — Patient Instructions (Signed)
1) STOP ALBUTEROL NEBULIZERS.      REPLACE WITH LEVALBUTEROL NEBULIZERS UP TO FOUR TIMES A DAY     AS NEEDED FOR WHEEZING. 2) USE XANAX 0.25 MG 1-2 TIMES DAILY FOR ANXIETY/PANIC ATTACKS. INCREASE ZOLOFT TO 50 MG ONCE DAILY

## 2021-02-08 NOTE — Chronic Care Management (AMB) (Signed)
Patient application completed for Eliquis and ready to submit once receiving proof of 3% out of pocket. Patient and his wife will determine amount that they have spent on medication this year and contact pharmacist/provide proof of income.   Dr. Tobie Poet sent message to pulmonology to request Judithann Sauger as an option. Pharmacist will coordinate patient assistance if approved/needed. Patient currently approved for Symbicort.   Sherre Poot, PharmD, [facility] Clinical Pharmacist [facility] [phone] (office) [phone] (mobile)

## 2021-02-11 ENCOUNTER — Encounter: Payer: Self-pay | Admitting: Family Medicine

## 2021-02-13 DIAGNOSIS — J1282 Pneumonia due to coronavirus disease 2019: Secondary | ICD-10-CM | POA: Diagnosis not present

## 2021-02-13 DIAGNOSIS — J449 Chronic obstructive pulmonary disease, unspecified: Secondary | ICD-10-CM | POA: Diagnosis not present

## 2021-02-14 ENCOUNTER — Other Ambulatory Visit: Payer: Self-pay

## 2021-02-14 ENCOUNTER — Ambulatory Visit: Payer: Medicare Other

## 2021-02-14 DIAGNOSIS — J1282 Pneumonia due to coronavirus disease 2019: Secondary | ICD-10-CM | POA: Diagnosis not present

## 2021-02-14 DIAGNOSIS — I482 Chronic atrial fibrillation, unspecified: Secondary | ICD-10-CM

## 2021-02-14 DIAGNOSIS — J449 Chronic obstructive pulmonary disease, unspecified: Secondary | ICD-10-CM

## 2021-02-14 DIAGNOSIS — E782 Mixed hyperlipidemia: Secondary | ICD-10-CM

## 2021-02-14 DIAGNOSIS — E1169 Type 2 diabetes mellitus with other specified complication: Secondary | ICD-10-CM

## 2021-02-14 MED ORDER — LEVALBUTEROL HCL 1.25 MG/3ML IN NEBU: 1.2500 mg | INHALATION_SOLUTION | Freq: Four times a day (QID) | RESPIRATORY_TRACT | 3 refills | Status: DC | PRN

## 2021-02-14 NOTE — Progress Notes (Signed)
Chronic Care Management Pharmacy Note  02/14/2021 Name:  Paul [name] MRN:  [id number] DOB:  [birth year]   Plan Updates:   Paul [name] reports decreased anxiety with medication and  is tolerating levalbuterol well. Requested order to be sent to [facility] for future fills.  He follows up with pulmonology next week and will discuss option of trying Breztri at that time.   Subjective: Paul [name] is an 75 y.o. year old male who is a primary patient of Cox, Kirsten, MD.  The CCM team was consulted for assistance with disease management and care coordination needs.    Engaged with patient by telephone for follow up visit in response to provider referral for pharmacy case management and/or care coordination services.   Consent to Services:  The patient was given information about Chronic Care Management services, agreed to services, and gave verbal consent prior to initiation of services.  Please see initial visit note for detailed documentation.   Patient Care Team: [name], MD as PCP - General (Family Medicine) Jerline Pain, MD as PCP - Cardiology (Cardiology) Elsie Stain, MD as Attending Physician (Pulmonary Disease) Tanda Rockers, MD as Consulting Physician (Pulmonary Disease) [place] as Registered Nurse [facility] [place]) Burnice Logan, [facility] as Pharmacist (Pharmacist)  Recent office visits: 02/08/2021 - sertraline 50 mg daily. Alprazolam 0.25 mg bid for severe anxiety. Change albuterol nebulizer to Xopenex.   Recent consult visits: 02/05/2021 - Pulmonology - pulmonary rehab encouraged. Discussed option of azithromycin three times weekly. Continue on Symbicort and Spiriva. Finish doxycycline. Decrease prednisone by 2.5 every 5 days until 20 mg daily - hold at 20 mg daily.   Hospital visits:   Objective:  Lab Results  Component Value Date   CREATININE 0.68 01/30/2021   BUN 17 01/30/2021   GFRNONAA >60 01/30/2021   GFRAA 107  11/20/2020   NA 140 01/30/2021   K 3.8 01/30/2021   CALCIUM 8.9 01/30/2021   CO2 30 01/30/2021    Lab Results  Component Value Date/Time   HGBA1C 7.2 (H) 01/31/2021 02:49 PM   HGBA1C 6.3 (H) 11/20/2020 12:02 PM    Last diabetic Eye exam: No results found for: HMDIABEYEEXA  Last diabetic Foot exam: No results found for: HMDIABFOOTEX   Lab Results  Component Value Date   CHOL 172 11/20/2020   HDL 60 11/20/2020   LDLCALC 90 11/20/2020   TRIG 62 12/25/2020   CHOLHDL 2.9 11/20/2020    Hepatic Function Latest Ref Rng & Units 01/30/2021 01/14/2021 01/06/2021  Total Protein 6.5 - 8.1 g/dL 6.7 7.1 5.9(L)  Albumin 3.5 - 5.0 g/dL 3.4(L) 3.6 2.9(L)  AST 15 - 41 U/L _0 ALT 0 - 44 U/L 31 31 33  Alk Phosphatase 38 - 126 U/L 93 67 53  Total Bilirubin 0.3 - 1.2 mg/dL 0.2(L) 0.4 0.6  Bilirubin, Direct 0.0 - 0.2 mg/dL - - -    Lab Results  Component Value Date/Time   TSH 2.539 03/22/2019 06:21 AM    CBC Latest Ref Rng & Units 01/30/2021 01/15/2021 01/14/2021  WBC 4.0 - 10.5 K/uL 10.7(H) 9.5 16.4(H)  Hemoglobin 13.0 - 17.0 g/dL 11.4(L) 10.3(L) 11.7(L)  Hematocrit 39.0 - 52.0 % 36.8(L) 32.2(L) 38.6(L)  Platelets 150 - 400 K/uL 347 190 313    No results found for: VD25OH  Clinical ASCVD: Yes  The 10-year ASCVD risk score Mikey Bussing DC Jr., et al., 2013) is: 49%   Values used to calculate the score:  Age: 24 years     Sex: Male     Is Non-Hispanic African American: No     Diabetic: Yes     Tobacco smoker: No     Systolic Blood Pressure: 938 mmHg     Is BP treated: Yes     HDL Cholesterol: 60 mg/dL     Total Cholesterol: 172 mg/dL    Depression screen [facility] 2/9 02/08/2021 02/15/2020 01/25/2020  Decreased Interest 0 0 0  Down, Depressed, Hopeless 3 0 0  PHQ - 2 Score 3 0 0  Altered sleeping 3 - -  Tired, decreased energy 3 - -  Change in appetite 0 - -  Feeling bad or failure about yourself  3 - -  Trouble concentrating 0 - -  Moving slowly or fidgety/restless 3 - -  Suicidal  thoughts 0 - -  PHQ-9 Score 15 - -  Difficult doing work/chores Very difficult - -     CHA2DS2-VASc Score = 5  The patient's score is based upon: CHF History: Yes HTN History: Yes Diabetes History: Yes Stroke History: No Vascular Disease History: No Age Score: 2 Gender Score: 0    Social History   Tobacco Use  Smoking Status Former Smoker  . Packs/day: 2.00  . Years: 52.00  . Pack years: 104.00  . Types: Cigarettes  . Quit date: 02/03/2009  . Years since quitting: 12.0  Smokeless Tobacco Never Used  Tobacco Comment   Counseled to remain smoke free   BP Readings from Last 3 Encounters:  02/08/21 140/60  02/05/21 130/66  02/02/21 122/77   Pulse Readings from Last 3 Encounters:  02/08/21 80  02/05/21 87  02/02/21 81   Wt Readings from Last 3 Encounters:  02/08/21 150 lb (68 kg)  02/05/21 151 lb 12.8 oz (68.9 kg)  01/30/21 149 lb 14.6 oz (68 kg)    Assessment/Interventions: Review of patient past medical history, allergies, medications, health status, including review of consultants reports, laboratory and other test data, was performed as part of comprehensive evaluation and provision of chronic care management services.   SDOH:  (Social Determinants of Health) assessments and interventions performed: Yes   CCM Care Plan  Allergies  Allergen Reactions  . Daliresp [Roflumilast] Diarrhea and Nausea And Vomiting  . Alendronate Anxiety and Other (See Comments)    Jittery/nervous  . Levaquin [Levofloxacin] Palpitations and Other (See Comments)    Made the B/P fluctuate and heart raced    Medications Reviewed Today    Reviewed by [name], MD (Physician) on 02/11/21 at 2140  Med List Status: <None>  Medication Order Taking? Sig Documenting Provider Last Dose Status Informant  acetaminophen (TYLENOL) 325 MG tablet [id number] Yes Take 2 tablets (650 mg total) by mouth every 6 (six) hours as needed for mild pain or headache (fever >/= 101). Blain Pais,  MD Taking Active Spouse/Significant Other  albuterol [facility] HFA) [phone] Base) MCG/ACT inhaler [id number] Yes Inhale 2 puffs into the lungs every 6 (six) hours as needed for wheezing or shortness of breath.  [provider] Taking Active Spouse/Significant Other  albuterol (PROVENTIL) (2.5 MG/3ML) 0.083% nebulizer solution [id number] Yes Take 3 mLs (2.5 mg total) by nebulization in the morning and at bedtime.  Patient taking differently: Take 2.5 mg by nebulization 4 (four) times daily.   Parrett, Fonnie Mu, NP Taking Active Spouse/Significant Other  ALPRAZolam (XANAX) 0.25 MG tablet [id number] Yes Take 1 tablet (0.25 mg total) by mouth 2 (two) times daily as needed  for anxiety. Cox, Kirsten, MD  Active   apixaban (ELIQUIS) 5 MG TABS tablet [id number] Yes Take 1 tablet (5 mg total) by mouth 2 (two) times daily. Jerline Pain, MD Taking Active Spouse/Significant Other  ascorbic acid (VITAMIN C) 500 MG tablet [id number] Yes Take 1 tablet (500 mg total) by mouth daily.  Patient taking differently: Take 1,000 mg by mouth daily.   Blain Pais, MD Taking Active   atorvastatin (LIPITOR) 40 MG tablet [id number] Yes TAKE 1 TABLET(40 MG) BY MOUTH DAILY AFTER SUPPER  Patient taking differently: Take 40 mg by mouth at bedtime.   Jerline Pain, MD Taking Active Spouse/Significant Other  bisoprolol (ZEBETA) 5 MG tablet [id number] Yes Take 1 tablet (5 mg total) by mouth daily. Jerline Pain, MD Taking Active Spouse/Significant Other  budesonide-formoterol [facility]) 160-4.5 MCG/ACT inhaler [id number] Yes Inhale 2 puffs into the lungs 2 (two) times daily. Cox, Kirsten, MD Taking Active Spouse/Significant Other  calcium carbonate (OS-CAL) 600 MG TABS tablet [id number] Yes Take 600 mg by mouth daily. [provider] Taking Active Spouse/Significant Other  Cholecalciferol (VITAMIN D) 50 MCG (2000 UT) CAPS [id number] Yes Take 2,000 Units by mouth daily. [provider] Taking Active  Spouse/Significant Other  denosumab (PROLIA) 60 MG/ML SOSY injection [id number] Yes Inject 60 mg into the skin every 6 (six) months. [provider] Taking Active Spouse/Significant Other           Med Note Randye Lobo, Ludger Nutting   Fri Jan 19, 2021  8:28 AM)    diltiazem (CARDIZEM CD) 120 MG 24 hr capsule [id number] Yes Take 1 capsule (120 mg total) by mouth daily. Jerline Pain, MD Taking Active Spouse/Significant Other  diltiazem (CARDIZEM) 60 MG tablet [id number] Yes Take 1 tablet (60 mg total) by mouth 4 (four) times daily as needed.  Patient taking differently: Take 60 mg by mouth 4 (four) times daily as needed (Blood Pressure).   Jerline Pain, MD Taking Active Spouse/Significant Other  furosemide (LASIX) 40 MG tablet [id number] Yes Take 1 tablet (40 mg total) by mouth daily. Jerline Pain, MD Taking Active Spouse/Significant Other  guaiFENesin (MUCINEX) 600 MG 12 hr tablet [id number] Yes Take 1,200 mg by mouth 2 (two) times daily. [provider] Taking Active Spouse/Significant Other           Med Note (Alfonse Spruce, ANH T   Sat Apr 01, 2020  4:27 PM)    levalbuterol Penne Lash) 1.25 MG/3ML nebulizer solution [id number] Yes Take 1.25 mg by nebulization every 6 (six) hours as needed for wheezing. Cox, Kirsten, MD  Active   loratadine (CLARITIN) 10 MG tablet [id number] Yes Take 10 mg by mouth in the morning. [provider] Taking Active Spouse/Significant Other  Melatonin 5 MG TABS [id number] Yes Take 5 mg by mouth at bedtime. [provider] Taking Active Spouse/Significant Other  Multiple Vitamin (MULTIVITAMIN WITH MINERALS) TABS tablet [id number] Yes Take 1 tablet by mouth daily. [provider] Taking Active Spouse/Significant Other  Niacinamide-Zn-Cu-Methfo-Se-Cr (NICOTINAMIDE PO) [id number] Yes Take 1 tablet by mouth in the morning and at bedtime. [provider] Taking Active Spouse/Significant Other  omeprazole (PRILOSEC OTC) 20 MG tablet [id number]  Yes Take 20 mg by mouth 2 (two) times daily before a meal. [provider] Taking Active Spouse/Significant Other  OXYGEN [id number] Yes Inhale 2-2.5 L/min into the lungs See admin instructions. Inhale 2 L/min of oxygen into the lungs at bedtime and when ambulatory [provider] Taking Active Spouse/Significant Other  predniSONE (  DELTASONE) 10 MG tablet [id number] Yes Take 40 mg p.o. daily for 3 days, then 30 mg p.o. daily for 3 days, then 20 mg p.o. daily for 3 days, then 10 mg p.o. daily for 3 days and stop. Jonetta Osgood, MD Taking Active   sertraline (ZOLOFT) 50 MG tablet [id number] Yes Take 1 tablet (50 mg total) by mouth daily. Cox, Kirsten, MD  Active   sodium chloride (OCEAN) 0.65 % nasal spray [id number] Yes Place 1 spray into the nose as needed for congestion. [provider] Taking Active Spouse/Significant Other  Tiotropium Bromide Monohydrate (SPIRIVA RESPIMAT) 2.5 MCG/ACT AERS [id number] Yes Inhale 2 puffs into the lungs daily. Tanda Rockers, MD Taking Active Spouse/Significant Other          Patient Active Problem List   Diagnosis Date Noted  . Chronic respiratory failure with hypoxia ([place]) 01/16/2021  . Anxiety 01/14/2021  . Lingular pneumonia 01/05/2021  . COVID-19 12/24/2020  . COPD exacerbation ([place]) 12/09/2020  . COPD with acute exacerbation ([place]) 12/08/2020  . Physical deconditioning 04/12/2020  . Syncope 04/01/2020  . Chronic diastolic CHF (congestive heart failure) ([place]) 04/01/2020  . Osteoporosis with current pathological fracture 02/06/2020  . Essential hypertension 01/28/2020  . Chronic bilateral thoracic back pain 01/25/2020  . 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[place]) 01/25/2020  . HLD (hyperlipidemia) 01/05/2020  . Vertebral compression fracture ([place]) 05/29/2019  . Chronic anemia 05/29/2019  . Pulmonary nodules 05/29/2019  . Coronary artery calcification 04/06/2019  . Atherosclerosis of aorta ([place]) 04/06/2019  .  Paroxysmal atrial fibrillation ([facility])   . Acute on chronic respiratory failure with hypoxia ([place])   . Lung nodule < 6cm on CT 05/29/2016  . Former cigarette smoker 12/05/2015  . COPD GOLD  III      Immunization History  Administered Date(s) Administered  . Fluad Quad(high Dose 65+) 10/02/2020  . Influenza Split 09/22/2013, 09/01/2015  . Influenza Whole 09/04/2011, 09/21/2012  . Influenza, High Dose Seasonal PF 09/02/2016, 09/10/2017, 08/31/2018, 09/24/2018, 10/21/2019  . Influenza-Unspecified 08/23/2014  . Moderna Sars-Covid-2 Vaccination 01/17/2020, 02/14/2020, 10/06/2020  . Pneumococcal Conjugate-13 01/11/2014, 12/09/2016  . Pneumococcal Polysaccharide-23 08/23/2010, 08/29/2011  . Zoster Recombinat (Shingrix) 04/04/2014    Conditions to be addressed/monitored:  Hyperlipidemia, Atrial Fibrillation and COPD  Care Plan : CCM Pharmacy Care Plan  Updates made by Burnice Logan, RPH since 02/14/2021 12:00 AM    Problem: afib, dm, copd, hld   Priority: High  Onset Date: 02/14/2021    Long-Range Goal: Disease Management   Start Date: 02/14/2021  Expected End Date: 02/14/2022  This Visit's Progress: On track  Priority: High  Note:    Current Barriers:  . Unable to independently afford treatment regimen  Pharmacist Clinical Goal(s):  Marland Kitchen Over the next 90 days, patient will verbalize ability to afford treatment regimen through collaboration with PharmD and provider.   Interventions: . 1:1 collaboration with [name], MD regarding development and update of comprehensive plan of care as evidenced by provider attestation and co-signature . Inter-disciplinary care team collaboration (see longitudinal plan of care) . Comprehensive medication review performed; medication list updated in electronic medical record  Hyperlipidemia: (LDL goal < 100) -Controlled -Current treatment: . Atorvastatin 40 mg daily after supper -Medications previously tried: none reported  -Current dietary  patterns: eats healthy -Current exercise habits: starting pulmonary rehab -Educated on Cholesterol goals;  Benefits of statin for ASCVD risk reduction; Importance of limiting foods high in cholesterol; -Counseled on diet and exercise extensively Recommended to continue  current medication  Atrial Fibrillation (Goal: prevent stroke and major bleeding) -Controlled -CHADSVASC: 5 -Current treatment: . Rate control: diltiazem CD 120 mg daily, bisoprolol 5 mg daily . Anticoagulation: Eliquis 5 mg bid  -Medications previously tried: none reported  -Home BP and HR readings: 140/60 pulse 80  -Counseled on importance of adherence to anticoagulant exactly as prescribed; avoidance of NSAIDs due to increased bleeding risk with anticoagulants; coordinating application of Eliquis once 3% out of pocket proof received.  -Recommended to continue current medication  COPD (Goal: control symptoms and prevent exacerbations) -Controlled -Current treatment  . Albuterol inhaler 2 puffs every 6 hours prn for wheezing/shortness of breath . Levalbuterol 1.25 mg/7m every 6 hours prn  . Symbicort 160/4.5 mcg/act 2 puffs bid  . Prednisone - dose adjusted depending on flare . Spiriva 2.5 mcg/act 2 puffs into lungs daily  . Mucinex 1200 mg bid  -Medications previously tried: Trelegy  -Gold Grade: Gold 3 (FEV1 30-49%) -Exacerbations requiring treatment in last 6 months: yes -Patient reports consistent use of maintenance inhaler -Frequency of rescue inhaler use: using levalbuterol nebulizer prn -Counseled on Proper inhaler technique; Benefits of consistent maintenance inhaler use -Recommended to continue current medication Collaborated with PCP to order leavlbuterol and new nebulizer supply through [facility]  Patient Goals/Self-Care Activities . Over the next 90 days, patient will:  - take medications as prescribed  Follow Up Plan: Telephone follow up appointment with care management team member  scheduled for:03/2021      Medication Assistance: Symbicortobtained through [employer]ication assistance program.  Enrollment ends 12/22/2020   Pharmacist will coordinate patient assistance application for Eliquis once 3% out of pocket expense met.   [facility] [id number]: [phone]: [phone] Uses pill box? Yes Pt endorses 100% compliance  We discussed: Current pharmacy is preferred with insurance plan and patient is satisfied with pharmacy services Patient decided to: Continue current medication management strategy  Care Plan and Follow Up Patient Decision:  Patient agrees to Care Plan and Follow-up.  Plan: Telephone follow up appointment with care management team member scheduled for:  03/2021

## 2021-02-14 NOTE — Patient Instructions (Addendum)
Visit Information  Goals Addressed            This Visit's Progress   . Learn and Do Breathing Exercises-COPD       Timeframe:  Long-Range Goal Priority:  High Start Date:                  02/14/21           Expected End Date:  02/14/22                     Follow Up Date 03/26/2021    - do breathing exercises every day - do exercises in a comfortable position that makes breathing as easy as possible    Why is this important?    Breathing exercises can help lessen the cough that comes with chronic obstructive pulmonary disease.   Doing the exercises will give you more energy.   They will also help you to control your symptoms.    Notes:     . Learn More About My Health       Timeframe:  Long-Range Goal Priority:  High Start Date:       02/14/21                      Expected End Date:     02/14/22                   Follow Up Date 02/14/22    - ask questions - repeat what I heard to make sure I understand - bring a list of my medicines to the visit    Why is this important?    The best way to learn about your health and care is by talking to the doctor and nurse.   They will answer your questions and give you information in the way that you like best.    Notes:     Marland Kitchen Manage My Medicine       Timeframe:  Long-Range Goal Priority:  High Start Date:              02/14/21                Expected End Date:   02/14/22                    Follow Up Date 03/26/21    - call for medicine refill 2 or 3 days before it runs out - keep a list of all the medicines I take; vitamins and herbals too - use a pillbox to sort medicine    Why is this important?   . These steps will help you keep on track with your medicines.   Notes:       Patient Care Plan: CCM Pharmacy Care Plan    Problem Identified: afib, dm, copd, hld   Priority: High  Onset Date: 02/14/2021    Long-Range Goal: Disease Management   Start Date: 02/14/2021  Expected End Date: 02/14/2022  This  Visit's Progress: On track  Priority: High  Note:    Current Barriers:  . Unable to independently afford treatment regimen  Pharmacist Clinical Goal(s):  Marland Kitchen Over the next 90 days, patient will verbalize ability to afford treatment regimen through collaboration with PharmD and provider.   Interventions: . 1:1 collaboration with [name], MD regarding development and update of comprehensive plan of care as evidenced by provider attestation and co-signature . Inter-disciplinary care team collaboration (  see longitudinal plan of care) . Comprehensive medication review performed; medication list updated in electronic medical record  Hyperlipidemia: (LDL goal < 100) -Controlled -Current treatment: . Atorvastatin 40 mg daily after supper -Medications previously tried: none reported  -Current dietary patterns: eats healthy -Current exercise habits: starting pulmonary rehab -Educated on Cholesterol goals;  Benefits of statin for ASCVD risk reduction; Importance of limiting foods high in cholesterol; -Counseled on diet and exercise extensively Recommended to continue current medication  Atrial Fibrillation (Goal: prevent stroke and major bleeding) -Controlled -CHADSVASC: 5 -Current treatment: . Rate control: diltiazem CD 120 mg daily, bisoprolol 5 mg daily . Anticoagulation: Eliquis 5 mg bid  -Medications previously tried: none reported  -Home BP and HR readings: 140/60 pulse 80  -Counseled on importance of adherence to anticoagulant exactly as prescribed; avoidance of NSAIDs due to increased bleeding risk with anticoagulants; coordinating application of Eliquis once 3% out of pocket proof received.  -Recommended to continue current medication  COPD (Goal: control symptoms and prevent exacerbations) -Controlled -Current treatment  . Albuterol inhaler 2 puffs every 6 hours prn for wheezing/shortness of breath . Levalbuterol 1.25 mg/84ml every 6 hours prn  . Symbicort 160/4.5  mcg/act 2 puffs bid  . Prednisone - dose adjusted depending on flare . Spiriva 2.5 mcg/act 2 puffs into lungs daily  . Mucinex 1200 mg bid  -Medications previously tried: Trelegy  -Gold Grade: Gold 3 (FEV1 30-49%) -Exacerbations requiring treatment in last 6 months: yes -Patient reports consistent use of maintenance inhaler -Frequency of rescue inhaler use: using levalbuterol nebulizer prn -Counseled on Proper inhaler technique; Benefits of consistent maintenance inhaler use -Recommended to continue current medication Collaborated with PCP to order leavlbuterol and new nebulizer supply through [facility]   Patient Goals/Self-Care Activities . Over the next 90 days, patient will:  - take medications as prescribed  Follow Up Plan: Telephone follow up appointment with care management team member scheduled for:03/2021      The patient verbalized understanding of instructions, educational materials, and care plan provided today and declined offer to receive copy of patient instructions, educational materials, and care plan.  Telephone follow up appointment with pharmacy team member scheduled for: 03/26/2021  Paul [name], [facility]  Atrial Fibrillation  Atrial fibrillation is a type of heartbeat that is irregular or fast. If you have this condition, your heart beats without any order. This makes it hard for your heart to pump blood in a normal way. Atrial fibrillation may come and go, or it may become a long-lasting problem. If this condition is not treated, it can put you at higher risk for stroke, heart failure, and other heart problems. What are the causes? This condition may be caused by diseases that damage the heart. They include:  High blood pressure.  Heart failure.  Heart valve disease.  Heart surgery. Other causes include:  Diabetes.  Thyroid disease.  Being overweight.  Kidney disease. Sometimes the cause is not known. What increases the risk? You are more  likely to develop this condition if:  You are older.  You smoke.  You exercise often and very hard.  You have a family history of this condition.  You are a man.  You use drugs.  You drink a lot of alcohol.  You have lung conditions, such as emphysema, pneumonia, or COPD.  You have sleep apnea. What are the signs or symptoms? Common symptoms of this condition include:  A feeling that your heart is beating very fast.  Chest pain or  discomfort.  Feeling short of breath.  Suddenly feeling light-headed or weak.  Getting tired easily during activity.  Fainting.  Sweating. In some cases, there are no symptoms. How is this treated? Treatment for this condition depends on underlying conditions and how you feel when you have atrial fibrillation. They include:  Medicines to: ? Prevent blood clots. ? Treat heart rate or heart rhythm problems.  Using devices, such as a pacemaker, to correct heart rhythm problems.  Doing surgery to remove the part of the heart that sends bad signals.  Closing an area where clots can form in the heart (left atrial appendage). In some cases, your doctor will treat other underlying conditions. Follow these instructions at home: Medicines  Take over-the-counter and prescription medicines only as told by your doctor.  Do not take any new medicines without first talking to your doctor.  If you are taking blood thinners: ? Talk with your doctor before you take any medicines that have aspirin or NSAIDs, such as ibuprofen, in them. ? Take your medicine exactly as told by your doctor. Take it at the same time each day. ? Avoid activities that could hurt or bruise you. Follow instructions about how to prevent falls. ? Wear a bracelet that says you are taking blood thinners. Or, carry a card that lists what medicines you take. Lifestyle  Do not use any products that have nicotine or tobacco in them. These include cigarettes, e-cigarettes, and  chewing tobacco. If you need help quitting, ask your doctor.  Eat heart-healthy foods. Talk with your doctor about the right eating plan for you.  Exercise regularly as told by your doctor.  Do not drink alcohol.  Lose weight if you are overweight.  Do not use drugs, including cannabis.      General instructions  If you have a condition that causes breathing to stop for a short period of time (apnea), treat it as told by your doctor.  Keep a healthy weight. Do not use diet pills unless your doctor says they are safe for you. Diet pills may make heart problems worse.  Keep all follow-up visits as told by your doctor. This is important. Contact a doctor if:  You notice a change in the speed, rhythm, or strength of your heartbeat.  You are taking a blood-thinning medicine and you get more bruising.  You get tired more easily when you move or exercise.  You have a sudden change in weight. Get help right away if:  You have pain in your chest or your belly (abdomen).  You have trouble breathing.  You have side effects of blood thinners, such as blood in your vomit, poop (stool), or pee (urine), or bleeding that cannot stop.  You have any signs of a stroke. "BE FAST" is an easy way to remember the main warning signs: ? B - Balance. Signs are dizziness, sudden trouble walking, or loss of balance. ? E - Eyes. Signs are trouble seeing or a change in how you see. ? F - Face. Signs are sudden weakness or loss of feeling in the face, or the face or eyelid drooping on one side. ? A - Arms. Signs are weakness or loss of feeling in an arm. This happens suddenly and usually on one side of the body. ? S - Speech. Signs are sudden trouble speaking, slurred speech, or trouble understanding what people say. ? T - Time. Time to call emergency services. Write down what time symptoms started.  You have  other signs of a stroke, such as: ? A sudden, very bad headache with no known  cause. ? Feeling like you may vomit (nausea). ? Vomiting. ? A seizure. These symptoms may be an emergency. Do not wait to see if the symptoms will go away. Get medical help right away. Call your local emergency services (911 in the [place] Do not drive yourself to the hospital.   Summary  Atrial fibrillation is a type of heartbeat that is irregular or fast.  You are at higher risk of this condition if you smoke, are older, have diabetes, or are overweight.  Follow your doctor's instructions about medicines, diet, exercise, and follow-up visits.  Get help right away if you have signs or symptoms of a stroke.  Get help right away if you cannot catch your breath, or you have chest pain or discomfort. This information is not intended to replace advice given to you by your health care provider. Make sure you discuss any questions you have with your health care provider. Document Revised: 06/02/2019 Document Reviewed: 06/02/2019 Elsevier Patient Education  [place].

## 2021-02-15 ENCOUNTER — Ambulatory Visit: Payer: Medicare Other | Admitting: Adult Health

## 2021-02-16 ENCOUNTER — Telehealth: Payer: Self-pay

## 2021-02-16 DIAGNOSIS — J1282 Pneumonia due to coronavirus disease 2019: Secondary | ICD-10-CM | POA: Diagnosis not present

## 2021-02-16 DIAGNOSIS — J449 Chronic obstructive pulmonary disease, unspecified: Secondary | ICD-10-CM | POA: Diagnosis not present

## 2021-02-16 NOTE — Progress Notes (Signed)
Chronic Care Management Pharmacy Assistant   Name: Paul [name]  MRN: [id number] DOB: September 26, 1946  Reason for Encounter:  Adherence  PCP : [name], MD  Allergies:   Allergies  Allergen Reactions  . Daliresp [Roflumilast] Diarrhea and Nausea And Vomiting  . Alendronate Anxiety and Other (See Comments)    Jittery/nervous  . Levaquin [Levofloxacin] Palpitations and Other (See Comments)    Made the B/P fluctuate and heart raced    Medications: Outpatient Encounter Medications as of 02/16/2021  Medication Sig  . acetaminophen (TYLENOL) 325 MG tablet Take 2 tablets (650 mg total) by mouth every 6 (six) hours as needed for mild pain or headache (fever >/= 101).  Marland Kitchen albuterol (PROAIR HFA) 108 (90 Base) MCG/ACT inhaler Inhale 2 puffs into the lungs every 6 (six) hours as needed for wheezing or shortness of breath.   Marland Kitchen albuterol (PROVENTIL) (2.5 MG/3ML) 0.083% nebulizer solution Take 3 mLs (2.5 mg total) by nebulization in the morning and at bedtime. (Patient taking differently: Take 2.5 mg by nebulization 4 (four) times daily.)  . ALPRAZolam (XANAX) 0.25 MG tablet Take 1 tablet (0.25 mg total) by mouth 2 (two) times daily as needed for anxiety.  Marland Kitchen apixaban (ELIQUIS) 5 MG TABS tablet Take 1 tablet (5 mg total) by mouth 2 (two) times daily.  Marland Kitchen ascorbic acid (VITAMIN C) 500 MG tablet Take 1 tablet (500 mg total) by mouth daily. (Patient taking differently: Take 1,000 mg by mouth daily.)  . atorvastatin (LIPITOR) 40 MG tablet TAKE 1 TABLET(40 MG) BY MOUTH DAILY AFTER SUPPER (Patient taking differently: Take 40 mg by mouth at bedtime.)  . bisoprolol (ZEBETA) 5 MG tablet Take 1 tablet (5 mg total) by mouth daily.  . budesonide-formoterol (SYMBICORT) 160-4.5 MCG/ACT inhaler Inhale 2 puffs into the lungs 2 (two) times daily.  . calcium carbonate (OS-CAL) 600 MG TABS tablet Take 600 mg by mouth daily.  . Cholecalciferol (VITAMIN D) 50 MCG (2000 UT) CAPS Take 2,000 Units by mouth daily.  Marland Kitchen  denosumab (PROLIA) 60 MG/ML SOSY injection Inject 60 mg into the skin every 6 (six) months.  . diltiazem (CARDIZEM CD) 120 MG 24 hr capsule Take 1 capsule (120 mg total) by mouth daily.  Marland Kitchen diltiazem (CARDIZEM) 60 MG tablet Take 1 tablet (60 mg total) by mouth 4 (four) times daily as needed. (Patient taking differently: Take 60 mg by mouth 4 (four) times daily as needed (Blood Pressure).)  . furosemide (LASIX) 40 MG tablet Take 1 tablet (40 mg total) by mouth daily.  Marland Kitchen guaiFENesin (MUCINEX) 600 MG 12 hr tablet Take 1,200 mg by mouth 2 (two) times daily.  Marland Kitchen levalbuterol (XOPENEX) 1.25 MG/3ML nebulizer solution Take 1.25 mg by nebulization every 6 (six) hours as needed for wheezing.  Marland Kitchen loratadine (CLARITIN) 10 MG tablet Take 10 mg by mouth in the morning.  . Melatonin 5 MG TABS Take 5 mg by mouth at bedtime.  . Multiple Vitamin (MULTIVITAMIN WITH MINERALS) TABS tablet Take 1 tablet by mouth daily.  . Niacinamide-Zn-Cu-Methfo-Se-Cr (NICOTINAMIDE PO) Take 1 tablet by mouth in the morning and at bedtime.  Marland Kitchen omeprazole (PRILOSEC OTC) 20 MG tablet Take 20 mg by mouth 2 (two) times daily before a meal.  . OXYGEN Inhale 2-2.5 L/min into the lungs See admin instructions. Inhale 2 L/min of oxygen into the lungs at bedtime and when ambulatory  . predniSONE (DELTASONE) 10 MG tablet Take 40 mg p.o. daily for 3 days, then 30 mg p.o. daily for 3 days,  then 20 mg p.o. daily for 3 days, then 10 mg p.o. daily for 3 days and stop.  Marland Kitchen sertraline (ZOLOFT) 50 MG tablet Take 1 tablet (50 mg total) by mouth daily.  . sodium chloride (OCEAN) 0.65 % nasal spray Place 1 spray into the nose as needed for congestion.  . Tiotropium Bromide Monohydrate (SPIRIVA RESPIMAT) 2.5 MCG/ACT AERS Inhale 2 puffs into the lungs daily.   No facility-administered encounter medications on file as of 02/16/2021.    Current Diagnosis: Patient Active Problem List   Diagnosis Date Noted  . Chronic respiratory failure with hypoxia ([place])  01/16/2021  . Anxiety 01/14/2021  . Lingular pneumonia 01/05/2021  . COVID-19 12/24/2020  . COPD exacerbation ([place]) 12/09/2020  . COPD with acute exacerbation ([place]) 12/08/2020  . Physical deconditioning 04/12/2020  . Syncope 04/01/2020  . Chronic diastolic CHF (congestive heart failure) ([place]) 04/01/2020  . Osteoporosis with current pathological fracture 02/06/2020  . Essential hypertension 01/28/2020  . Chronic bilateral thoracic back pain 01/25/2020  . 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 HLD (hyperlipidemia) 01/05/2020  . Vertebral compression fracture ([place]) 05/29/2019  . Chronic anemia 05/29/2019  . Pulmonary nodules 05/29/2019  . Coronary artery calcification 04/06/2019  . Atherosclerosis of aorta ([place]) 04/06/2019  . Paroxysmal atrial fibrillation ([facility])   . Acute on chronic respiratory failure with hypoxia ([place])   . Lung nodule < 6cm on CT 05/29/2016  . Former cigarette smoker 12/05/2015  . COPD GOLD  III     Chart review noted that a patient application completed for Eliquis and ready to submit once receiving proof of 3% out of pocket. Patient and his wife will determine amount that they have spent on medication this year and contact pharmacist/provide proof of income.   CPP, appointment on 02/14/21,   Mr. Squier reports decreased anxiety with medication and  is tolerating levalbuterol well. Requested order to be sent to [facility] for future fills.  He follows up with pulmonology next week and will discuss option of trying Breztri at that time.   Adherence gap Total Gaps - All Measures 0  Total Gaps - Star Measures 0 Diabetes: Hemoglobin A1c Control (<= 9.0%) Met: A1c less than 9 Diabetes: Hemoglobin A1c Testing Met: A1c Documented Controlling High Blood Pressure Met: BP < 140/90 Preventive Care and Screening: Tobacco Use: Screening and Cessation Intervention - With Non-Smokers Met: Non-Tobacco User Preventive Care and Screening: Tobacco  Use: Screening and Cessation Intervention 0 Preventive Care and Screening: Screening for Clinical Depression and Follow-up Plan Met: Negative Depression Screening Breast Cancer Screening 0 Colorectal Cancer Screening Met: Screening Current Visits: Wellness Bundle [facility]) Met: AWV performed Visits: Annual Wellness Visit [facility]) Met: AWV performed   Follow-Up:  Pharmacist Review  Donette Larry, [place], [place] Pharmacist Assistant [phone]

## 2021-02-19 ENCOUNTER — Encounter: Payer: Self-pay | Admitting: Internal Medicine

## 2021-02-19 ENCOUNTER — Other Ambulatory Visit: Payer: Self-pay

## 2021-02-19 ENCOUNTER — Ambulatory Visit (INDEPENDENT_AMBULATORY_CARE_PROVIDER_SITE_OTHER): Payer: Medicare Other | Admitting: Internal Medicine

## 2021-02-19 DIAGNOSIS — J9611 Chronic respiratory failure with hypoxia: Secondary | ICD-10-CM

## 2021-02-19 DIAGNOSIS — J449 Chronic obstructive pulmonary disease, unspecified: Secondary | ICD-10-CM | POA: Diagnosis not present

## 2021-02-19 DIAGNOSIS — J1282 Pneumonia due to coronavirus disease 2019: Secondary | ICD-10-CM | POA: Diagnosis not present

## 2021-02-19 MED ORDER — PREDNISONE 10 MG PO TABS: ORAL_TABLET | ORAL | 0 refills | Status: DC

## 2021-02-19 MED ORDER — BREZTRI AEROSPHERE 160-9-4.8 MCG/ACT IN AERO: 2.0000 | INHALATION_SPRAY | Freq: Two times a day (BID) | RESPIRATORY_TRACT | 0 refills | Status: DC

## 2021-02-19 NOTE — Progress Notes (Signed)
Paul [name], male    DOB: 1946-02-14,    MRN: [id number]   Brief patient profile:  44 yowm MM/Quit smoking  02/03/09  with GOLD III spirometry baseline doe x able to golf. Only using 02 at  [facility] with  maint on symbicort/ spiriva and no chronic pred/ albuterol twice daily at most  much worse mid March 2020.   PFT 05/29/16: FVC 2.08 L (86%) FEV1 0.83 L (31%) FEV1/FVC 0.40 FEF 25-75 0.30 L (14%) no bronchodilator response TLC 6.56 L (107%) RV 167% ERV 160% DLCO corrected 60% (hemoglobin 10.9) 07/15/13: FVC 2.68 L (60%) FEV1 1.63 L (53%) FEV1/FVC 0.61 FEF 25-75 0.76 L (26%) 02/04/12: FVC 2.18 L (57%) FEV1 1.09 L (36%) FEV1/FVC 0.50 FEF 25-75 0.33 L (11%)   Admit date: 03/19/2019 Discharge date: 03/25/2019   Recommendations for Outpatient Follow-up:  1. F/u with PCP within a week  for hospital discharge follow up, repeat cbc/bmp at follow up 2. F/u with cardiology for new diagnosis of afib 3. Home 02  Discharge Diagnoses:      Active Hospital Problems   Diagnosis Date Noted  . PAF (paroxysmal atrial fibrillation) ([place])   . Acute on chronic respiratory failure with hypoxia ([place])   . COPD exacerbation ([place]) 03/19/2019         Filed Weights   03/23/19 1700 03/24/19 0503 03/25/19 0528  Weight: 60.2 kg 60 kg 62.3 kg    History of present illness: (per admitting MD Paul [name]) PCP:Cox, Elnita Maxwell, MD Patient coming from:Home  Chief Complaint:Worsening shortness of breath  [name]:DHRC Paul [name] a 75 y.o.malewith history h/oCOPD, hyperlipidemia whoat baseline was using 2 L O2 via nasal cannula only at night and follows Paul [name] (Paul [name]) as outpatient presents with worsening shortness of breath. Patient reports that at baseline he has cough with white phlegm and is able to walk around doing his ADLs without shortness of breath and uses O2 only at night. 2 weeks back he developed productive cough with green phlegm and shortness of breath but no fevers. He  underwent CT chest with contrast on March 18 which was negative for PE or infiltrates. He apparently was given doxycycline/prednisone course at that time which helped him transiently but symptoms worsened again past Monday with shortness of breath on minimum exertion/cough and he noticed his pulse ox was dropping to 74%.He started using 2 L O2 nasal cannula continuous. On Tuesday, he called pulmonary office given persistent symptoms and also pulse oximetry showing O2 sats84% on exertion and 93% at rest in spite of using continuous O2. He was advised by Paul [name] to use 60 mg prednisone x3 days followed by 10 mg taper every 3 days. He was also advised to use nebulizer treatments every 3 hours and continue Symbicort inhaler twice daily. Patient was supposed to start 50 mg prednisone today but woke up feeling extremely short of breath. He states he is also been having bad bouts of cough at night with wheezing and bronchospasm.  He presented to [facility] ED where he was noted to be afebrile but hypoxic, required BiPAP for 3 hours, chest x-ray was negative for infiltrates. Patient denies any recent history of travel or known sick contacts with flu positive or coronavirus positive patients. He lives at home with his wife who is recovering from lung cancer and completed chemotherapy 1 month back.  Hospitalist service was called to request direct admission to medical floor but ED to ED transfer facilitated for thorough clinical evaluation prior  to requesting medical bed given coronavirus pandemic. In the ED at [facility], patient has been saturating well on 3 L nasal cannula but appears tachypneic on talking full sentences. He denies any chest pain, denies any fevers or leg swellings.  Hospital Course:  Active Problems:   COPD exacerbation (HCC)   PAF (paroxysmal atrial fibrillation) (HCC)   Acute on chronic respiratory failure with hypoxia (HCC)   Acute onHypoxic respiratory failure,  COPDexacerbation,Progression of severe COPD -Emphysema, with no evidence of superimposed acute cardiopulmonary disease -improving on iv steroids, oral zitrhomax, he finished total of 5 days of zithromax in the hospital, he is discharged on slow prednisone taper.  - o2 dropped to 86% while ambulating on room air, discharged on continuous home o2 ( previously on nighttime o2 only) -he is to follow with Paul Vaughan [name]    Paroxsymalafib, New diagnosis of afib -he is started on eliquis (CHADS-VASC 2, age and aortic atherosclerosis), cardizem, lasix -cardiology consulted recommend "Diltiazem CD 360. Avoid amiodarone and beta blockers given his underlying lung dz." -outpatient echo per cardiology   Aortic and multivessel coronary atherosclerosis - Changed to high intensity statin, atorvastatin 40mg  PO QD. Check lipids and ALT as outpatient in 3 months.  - ASA 81. Aggressive secondary prevention. - No chest pain.      History of Present Illness  76/05/2019  Pulmonary/ extended post hosp f/u eval/Chealsea Paske  Re GOLD III copd ? 02 dep now Chief Complaint  Patient presents with  . Hospitalization Follow-up    Breathing has improved some. He has not used his albuterol inhaler or neb since hospital d/c.  Dyspnea:  mb and back ok off 02 sats upper 80s at end  Cough: none Sleep: recliner x 45 degrees = baseline 02 2lpm hs  SABA use: none  Since d/c on symb 160 2bid and spiriva each pm (technique 50% on both, see a/p) On pred 50 mg daily on day as ov rec Plan A = Automatic = symbicort 160 Take 2 puffs first thing in am and then another 2 puffs about 12 hours later.  Spiriva x 2 puffs  right after symbicort  Work on inhaler technique: Plan B = Backup Only use your albuterol inhaler(Proventil/Proair)  as a rescue medication Plan C = Crisis - only use your albuterol nebulizer if you first try Plan B and it fails to help > ok to use the nebulizer up to every 4 hours but if start needing it  regularly call for immediate appointment Plan D = Doctor - call me if B and C not adequate Plan E = ER - go to ER or call 911 if all else fails   02  2lpm at bedtime and adjust to keep it over 90% with activity       06/15/2019  f/u ov/Paul [name] re:  COPD III  maint symb/spiriva on prednisone still 15 mg daily  Chief Complaint  Patient presents with  . Follow-up    Breathing has improved  Dyspnea:  10 min x 3 x daily  Due to back / no hills sats usually lower 90s RA Cough: no  Sleeping: 10 degrees SABA use: none 02: 2lpm hs  rec Prednisone 10 mg with bfast x one week then 1/2 x one week then 1/2 on even days x 2 weeks and stop At any point get worse, increase back to 20 mg daily and start over.   07/26/2019  f/u ov/Paul [name] re: copd III/ maint pred 10 mg daily  -tends to flare  on lower doses.  Despite maintenance with Symbicort and Spiriva. Chief Complaint  Patient presents with  . Follow-up    Had increased SOB when weened off pred so started back on 10 mg daily and this has helped. He is using his proair 4 x per wk on average and uses neb once per day.  Dyspnea:  10 -12 min outside and inside slowed both by back and breathing  Cough: better / min mucoid sputum production even in am Sleeping: flat  SABA use: way too much  - does not rechallenge  02: 02 2lpm hs  rec Prednisone 10 mg ceiling and 5 mg (one half) floor for now  Plan A  = symbicort, spiriva and prednisone as above Plan B Only use your albuterol as a rescue medication Plan C  Use the nebulizer up to every 4 hours if Plan B doesn't work      10/26/2019  f/u ov/Paul [name] re: GOLD III/  Steroid dep since early March 2020 on symb/spiriva and pred 20 mg daily  Chief Complaint  Patient presents with  . Follow-up    Breathing is doing well. He uses his proair once per wk on average. He has not used neb recently.   Dyspnea: walking 1.5 - 3 miles a day  Cough: none  Sleeping: hob up 6 inches mattress wedge  SABA use: as above,  rarely needed 02: 2lpm hs/ sats running in high 80's with activity  rec Plan A = Automatic = Always=    Trelegy one click each am (spiriva/ symbicort)  Plan B = Backup (to supplement plan A, not to replace it) Only use your albuterol inhaler as a rescue medication Plan C = Crisis (instead of Plan B but only if Plan B stops working) - only use your albuterol nebulizer if you first try Plan B  Try prednisone 15 mg daily if possible - increase to 20 mg daily if not doing as well    Televisit  12/27/19 recs Plan A = Automatic = Always=    Trelegy each am  Plan B = Backup (to supplement plan A, not to replace it) Only use your albuterol inhaler as a rescue medication Plan C = Crisis (instead of Plan B but only if Plan B stops working) - only use your albuterol nebulizer if you first try Plan B Plan D = Deltasone  - 2 prednisone = 20 mg / day until better then drop to 15 mg daily as tolerated  Make sure you check your oxygen saturations at highest level of activity to be sure it stays over 90% and adjust upward to maintain this level if needed but remember to turn it back to previous settings when you stop (to conserve your supply).  Late add:  Prilosec 20 mg Take 30- 60 min before your first and last meals of the day until returns and continue off the fosfamax (not taking it anyway)   GERD diet zpak    Admit date: 01/04/2020 Discharge date: 01/11/2020  Admitted From: home Disposition: home  Recommendations for Outpatient Follow-up:  1. Follow up with PCP in 1-2 weeks 2. Keep appointment with Paul. Joya Gaskins as scheduled 3. Please obtain BMP/CBC in one week 4. Please follow up on the following pending results: consider starting prolia  Home Health:no Equipment/Devices:home oxygen, not new     Brief/Interim Summary: Per H &P:Paul Paul [name] a 75 y.o.malewith medical history significant ofA. fib, COPD, chronic respiratory failure on 2 L home oxygen only at nighttime,  hyperlipidemia  presented with acute exacerbation of chronic obstructive pulmonary disease. Due to the severity of his underlying lung disease with chronic prednisone usage slowly weaned down to TID nebulizers scheduled and to oral prednisone. Had some Pt/OT which caused desaturations throughout hospital stay. Due to increased coughing in the hospital he had new upper back pain. With findings of midline tenderness X-ray ordered with old compression fractures and new compression fracture in t spine. Started calcitonin nasal spray and urged him to consider prolia. He states he tried fosamax and could not tolerate. Overall he continued to improve throughout the hospital course and was discharged home on extended prednisone taper back to home 20 mg daily. Asked to schedule nebulizer treatments TID at home for about 1 week then down to BID if able to tolerate.    Discharge Diagnoses:  Principal Problem:   COPD with acute exacerbation [facility]):   Chronic respiratory failure with hypoxia (HCC)   Paroxysmal atrial fibrillation (HCC)   HLD (hyperlipidemia)   SOB (shortness of breath)   01/27/2020  f/u ov/Zeina Akkerman re:  Copd with persistent flare since March 2020 remains high dose steroid dep  Chief Complaint  Patient presents with  . Follow-up    Breathing is about the same. He rarely uses his albuterol inhaler. He used neb last 1 wk ago. He has occ cough with green sputum. Currently on 40 mg pred.   Dyspnea:  Across the room  Cough: some before bed / uses flutter light green only using one 600 mg mucinex qam only  but does use flutter Sleeping: wedge blocks SABA use: minimal  02: 2lpm hs / says usually above 90% walking RA rec Stop lopressor and start Bisoprolol 5 mg daily in its place - if blood pressure is too high take twice daily  For cough / congestion > mucinex up to 1200 mg twice daily and use flutter Plan A = Automatic = Always=   Use albuterol neb 1st thing in am and wait 15 minutes and then use Trelegy  Plan B =  Backup (to supplement plan A, not to replace it) Only use your albuterol inhaler  Plan C = Crisis (instead of Plan B but only if Plan B stops working) - only use your albuterol nebulizer if you first try Plan B and it fails to help > ok to use the nebulizer up to every 4 hours but if start needing it regularly call for immediate appointment Prednisone 20 mg daily  - ok to double if doing worse Please schedule a follow up office visit in 4 weeks, sooner if needed  with all medications   Admission date:  02/16/2020   Discharge Date:  02/20/2020   COPD with acute exacerbation (HCC)  Paroxysmal atrial fibrillation ([place])  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[place])  Essential hypertension  Acute respiratory failure with hypoxia ([place])     02/24/2020   Extended post hosp ov/Paul [name] re: copd  On prednisone 30 mg daily to taper to 20 p 3 more days Dyspnea:  Just walking in house for now but much better Cough: better  Sleeping: wedge under mattress maybe 30 degrees hob ok SABA use: none now  02: sleep on 2lpm/ sats in 90's s supplement rec Make sure you check your oxygen saturations at highest level of activity to be sure it stays over 90% and adjust upward to maintain this level if needed but remember to turn it back to previous settings when you stop (to conserve your  supply).  Try albuterol(Proair) 15 min before an activity that you know would make you short of breath and see if it makes any difference and if makes none then don't take it after activity unless you can't catch your breath. Reduce prednsione to 20 mg daily = 2 x 10 as planned  - if you are doing great ok to try 1.5 = 15 mg daily If continue to flare then ok to add daliresp 250 mg one daily  For cough / congestion as needed >>  mucinex up 1200mg  every 12 hours but use the flutter valve as much as you can  Please schedule a follow up office visit in 6 weeks, call sooner if needed with all medications /inhalers/ solutions in hand  so we can verify exactly what you are taking.     10/03/2020  f/u ov/Paul [name] re: could not tolerate daliresp/ on pred 10 mg daily  Chief Complaint  Patient presents with  . Follow-up    Denies any problems with breathing  Dyspnea:  Treadmill  6/7 days,  30 min s stopping,  2.8 mph 3 degrees at the end does not check sats at peak ex Cough: none  Sleeping: wedge 30 degrees on side  SABA XVQ:MGQQP on neb twice daily  02: 2lpm hs /  Not using at all during day  rec  Make sure you check your oxygen saturations at highest level of activity to be sure it stays over 90% and adjust  02 flow upward to maintain this level if needed but remember to turn it back to previous settings when you stop (to conserve your supply).  Plan A = Automatic = Always=   symbicort and spiriva  First thing in am and then symbicort 12 hours later Prednisone 10 mg daily  Plan B = Backup (to supplement plan A, not to replace it) Only use your albuterol (Proventil) inhaler as a rescue medication Plan C = Crisis (instead of Plan B but only if Plan B stops working) - only use your albuterol nebulizer if you first try Plan B  request the moderna booster this month Please schedule a follow up visit in 3 months but call sooner if needed    02/19/2021  f/u ov/Paul [name] re: prednisone 27. 5 mg daily on way to 10 mg goal  Chief Complaint  Patient presents with  . Follow-up    SOB at times   Dyspnea:  Participating in rehab day #4/36 Cough: none Sleeping:  wedge under mattress 30 degress SABA use: twice daily  02: 2lpm sleeping, 2 lpm sitting and 4 lpm with ex  Covid status:   Max vax and hand covid    No obvious day to day or daytime variability or assoc excess/ purulent sputum or mucus plugs or hemoptysis or cp or chest tightness, subjective wheeze or overt sinus or hb symptoms.   Sleeping as above without nocturnal  or early am exacerbation  of respiratory  c/o's or need for noct saba. Also denies any obvious fluctuation of  symptoms with weather or environmental changes or other aggravating or alleviating factors except as outlined above   No unusual exposure hx or h/o childhood pna/ asthma or knowledge of premature birth.  Current Allergies, Complete Past Medical History, Past Surgical History, Family History, and Social History were reviewed in [employer] record.  ROS  The following are not active complaints unless bolded Hoarseness, sore throat, dysphagia, dental problems, itching, sneezing,  nasal congestion or discharge of excess  mucus or purulent secretions, ear ache,   fever, chills, sweats, unintended wt loss or wt gain, classically pleuritic or exertional cp,  orthopnea pnd or arm/hand swelling  or leg swelling, presyncope, palpitations, abdominal pain, anorexia, nausea, vomiting, diarrhea  or change in bowel habits or change in bladder habits, change in stools or change in urine, dysuria, hematuria,  rash, arthralgias, visual complaints, headache, numbness, weakness or ataxia or problems with walking or coordination,  change in mood or  memory.        Current Meds  Medication Sig  . acetaminophen (TYLENOL) 325 MG tablet Take 2 tablets (650 mg total) by mouth every 6 (six) hours as needed for mild pain or headache (fever >/= 101).  Marland Kitchen albuterol (PROAIR HFA) 108 (90 Base) MCG/ACT inhaler Inhale 2 puffs into the lungs every 6 (six) hours as needed for wheezing or shortness of breath.   Marland Kitchen albuterol (PROVENTIL) (2.5 MG/3ML) 0.083% nebulizer solution Take 3 mLs (2.5 mg total) by nebulization in the morning and at bedtime. (Patient taking differently: Take 2.5 mg by nebulization 4 (four) times daily.)  . ALPRAZolam (XANAX) 0.25 MG tablet Take 1 tablet (0.25 mg total) by mouth 2 (two) times daily as needed for anxiety.  Marland Kitchen apixaban (ELIQUIS) 5 MG TABS tablet Take 1 tablet (5 mg total) by mouth 2 (two) times daily.  Marland Kitchen ascorbic acid (VITAMIN C) 500 MG tablet Take 1 tablet (500 mg total) by mouth  daily. (Patient taking differently: Take 1,000 mg by mouth daily.)  . atorvastatin (LIPITOR) 40 MG tablet TAKE 1 TABLET(40 MG) BY MOUTH DAILY AFTER SUPPER (Patient taking differently: Take 40 mg by mouth at bedtime.)  . bisoprolol (ZEBETA) 5 MG tablet Take 1 tablet (5 mg total) by mouth daily.  . budesonide-formoterol (SYMBICORT) 160-4.5 MCG/ACT inhaler Inhale 2 puffs into the lungs 2 (two) times daily.  . calcium carbonate (OS-CAL) 600 MG TABS tablet Take 600 mg by mouth daily.  . Cholecalciferol (VITAMIN D) 50 MCG (2000 UT) CAPS Take 2,000 Units by mouth daily.  Marland Kitchen denosumab (PROLIA) 60 MG/ML SOSY injection Inject 60 mg into the skin every 6 (six) months.  . diltiazem (CARDIZEM CD) 120 MG 24 hr capsule Take 1 capsule (120 mg total) by mouth daily.  Marland Kitchen diltiazem (CARDIZEM) 60 MG tablet Take 1 tablet (60 mg total) by mouth 4 (four) times daily as needed. (Patient taking differently: Take 60 mg by mouth 4 (four) times daily as needed (Blood Pressure).)  . furosemide (LASIX) 40 MG tablet Take 1 tablet (40 mg total) by mouth daily.  Marland Kitchen guaiFENesin (MUCINEX) 600 MG 12 hr tablet Take 1,200 mg by mouth 2 (two) times daily.  Marland Kitchen levalbuterol (XOPENEX) 1.25 MG/3ML nebulizer solution Take 1.25 mg by nebulization every 6 (six) hours as needed for wheezing.  Marland Kitchen loratadine (CLARITIN) 10 MG tablet Take 10 mg by mouth in the morning.  . Melatonin 5 MG TABS Take 5 mg by mouth at bedtime.  . Multiple Vitamin (MULTIVITAMIN WITH MINERALS) TABS tablet Take 1 tablet by mouth daily.  . Niacinamide-Zn-Cu-Methfo-Se-Cr (NICOTINAMIDE PO) Take 1 tablet by mouth in the morning and at bedtime.  Marland Kitchen omeprazole (PRILOSEC OTC) 20 MG tablet Take 20 mg by mouth 2 (two) times daily before a meal.  . OXYGEN Inhale 2-2.5 L/min into the lungs See admin instructions. Inhale 2 L/min of oxygen into the lungs at bedtime and when ambulatory  . predniSONE (DELTASONE) 10 MG tablet Take 40 mg p.o. daily for 3 days, then 30 mg  p.o. daily for 3 days,  then 20 mg p.o. daily for 3 days, then 10 mg p.o. daily for 3 days and stop.  Marland Kitchen sertraline (ZOLOFT) 50 MG tablet Take 1 tablet (50 mg total) by mouth daily.  . sodium chloride (OCEAN) 0.65 % nasal spray Place 1 spray into the nose as needed for congestion.  . Tiotropium Bromide Monohydrate (SPIRIVA RESPIMAT) 2.5 MCG/ACT AERS Inhale 2 puffs into the lungs daily.                 Objective:       02/19/2021        151  10/03/2020      155  02/24/2020         137  01/27/2020         144 10/26/2019        156  07/26/2019          144   06/15/19 136 lb (61.7 kg)  05/29/19 134 lb 7.7 oz (61 kg)  05/28/19 134 lb 3.2 oz (60.9 kg)    Vital signs reviewed  02/19/2021  - Note at rest 02 sats  98% on 2lpm   General appearance:    amb slt cushingnoid wm nad         Reports  Full dentures     HEENT : pt wearing mask not removed for exam due to covid -19 concerns.    NECK :  without JVD/Nodes/TM/ nl carotid upstrokes bilaterally   LUNGS: no acc muscle use,  Mod barrel  contour chest wall with bilateral  Distant bs s audible wheeze and  without cough on insp or exp maneuvers and mod  Hyperresonant  to  percussion bilaterally     CV:  RRR  no s3 or murmur or increase in P2, and no edema   ABD:  soft and nontender with pos mid insp Hoover's  in the supine position. No bruits or organomegaly appreciated, bowel sounds nl  MS:     ext warm without deformities, calf tenderness, cyanosis or clubbing No obvious joint restrictions   SKIN: warm and dry without lesions    NEURO:  alert, approp, nl sensorium with  no motor or cerebellar deficits apparent.            I personally reviewed images and agree with radiology impression as follows:  CXR:   01/30/21 portable  No acute cardiopulmonary disease.     Assessment

## 2021-02-19 NOTE — Patient Instructions (Addendum)
Breztri  = symbicort and spiriva   Plan A = Automatic = Always=    Breztri Take 2 puffs first thing in am and then another 2 puffs about 12 hours later.    Work on inhaler technique:  relax and gently blow all the way out then take a nice smooth deep breath back in, triggering the inhaler at same time you start breathing in.  Hold for up to 5 seconds if you can. Blow out thru nose. Rinse and gargle with water when done   Plan B = Backup (to supplement plan A, not to replace it) Only use your albuterol inhaler as a rescue medication to be used if you can't catch your breath by resting or doing a relaxed purse lip breathing pattern.  - The less you use it, the better it will work when you need it. - Ok to use the inhaler up to 2 puffs  every 4 hours if you must but call for appointment if use goes up over your usual need - Don't leave home without it !!  (think of it like the spare tire for your car)   Plan C = Crisis (instead of Plan B but only if Plan B stops working) - only use your albuterol nebulizer if you first try Plan B and it fails to help > ok to use the nebulizer up to every 4 hours but if start needing it regularly call for immediate appointment  Prednisone 10 mg work it down to 1 pill daily   Make sure you check your oxygen saturation  at your highest level of activity  to be sure it stays over 90% and adjust  02 flow upward to maintain this level if needed but remember to turn it back to previous settings when you stop (to conserve your supply).   Please schedule a follow up visit in 3 months but call sooner if needed

## 2021-02-19 NOTE — Assessment & Plan Note (Addendum)
HS 02 since around 2005  As of 02/19/2021  = 2lpm and adjust during the day for sats >  90%          Each maintenance medication was reviewed in detail including emphasizing most importantly the difference between maintenance and prns and under what circumstances the prns are to be triggered using an action plan format where appropriate.  Total time for H and P, chart review, counseling, reviewing hfa device(s) and generating customized AVS unique to this office visit / same day charting = 25 min

## 2021-02-20 ENCOUNTER — Ambulatory Visit: Payer: Medicare Other | Admitting: Cardiology

## 2021-02-20 ENCOUNTER — Encounter: Payer: Self-pay | Admitting: Internal Medicine

## 2021-02-20 DIAGNOSIS — Z7952 Long term (current) use of systemic steroids: Secondary | ICD-10-CM | POA: Diagnosis not present

## 2021-02-20 DIAGNOSIS — L98492 Non-pressure chronic ulcer of skin of other sites with fat layer exposed: Secondary | ICD-10-CM | POA: Diagnosis not present

## 2021-02-20 NOTE — Assessment & Plan Note (Signed)
Quit smoking 01/2009  02/04/12 Paul [name]: FEV1 36% FVC 57% FEF 25 75    11% -05/29/16  FEV1 0.83 L (31%) FEV1/FVC 0.40 FEF 25-75 0.30 L (14%) no bronchodilator response p ? rx prior, DLCO corrected 60% (hemoglobin 10.9) - 03/29/2019   continue symb 160 2bid and spiriva smi 2 puffs each am  - 04/15/2019 placed on daily pred with ceiling 40 and floor of 10 mg daily and empirical rx for GERD - 04/15/2019 flutter valve training  - alpha one screen 06/15/2019  MM  Level 178 - 06/15/2019  After extensive coaching inhaler device,  effectiveness =    90% > so try to taper pred off using ceiling of 20 if worse > 07/26/2019 changed to ceiling of 10 and floor of 5 mg  - 10/26/2019  After extensive coaching inhaler device,  effectiveness =    90% with elipta and still needing pred 20 mg daily so try trelegy sample > consider breztri if prefers hfa and insurance covers  - 12/27/2019 added ppi bid ac otc for atypical spells of sob  - 01/27/2020 changed lopressor to bisoprolol due to refractory wheeze/ sob on lopressor 100 mg daily - Sinus CT  02/03/20 1. Minimal paranasal sinus mucosal thickening, limited to a left ethmoid air cell. No convincing acute sinusitis. 2. Symmetric but mild nasal cavity mucosal thickening, raising the possibility of mild rhinitis. CT chest 02/03/20 1. Coronary artery calcifications are noted suggesting coronary artery disease. 2. Emphysematous disease is noted in the upper lobes bilaterally. 3. Interval development of several old thoracic compression fractures since prior exam. No definite acute fracture is noted.  - 02/24/20 trial of daliresp 250 > could not tol   - 02/19/2021  After extensive coaching inhaler device,  effectiveness =   80% > try change to breztri    Group D in terms of symptom/risk and laba/lama/ICS  therefore appropriate rx at this point >>>  Try breztri 2bid and approp saba   Controlling anxiety should really help with symptoms and reduce saba need.  Prednisone taper to goal of  10 mg should be feasible

## 2021-02-21 DIAGNOSIS — J441 Chronic obstructive pulmonary disease with (acute) exacerbation: Secondary | ICD-10-CM | POA: Diagnosis not present

## 2021-02-23 DIAGNOSIS — L570 Actinic keratosis: Secondary | ICD-10-CM | POA: Diagnosis not present

## 2021-02-23 DIAGNOSIS — L720 Epidermal cyst: Secondary | ICD-10-CM | POA: Diagnosis not present

## 2021-02-26 DIAGNOSIS — J441 Chronic obstructive pulmonary disease with (acute) exacerbation: Secondary | ICD-10-CM | POA: Diagnosis not present

## 2021-02-27 DIAGNOSIS — Z7952 Long term (current) use of systemic steroids: Secondary | ICD-10-CM | POA: Diagnosis not present

## 2021-02-27 DIAGNOSIS — J9610 Chronic respiratory failure, unspecified whether with hypoxia or hypercapnia: Secondary | ICD-10-CM | POA: Diagnosis not present

## 2021-02-27 DIAGNOSIS — L98492 Non-pressure chronic ulcer of skin of other sites with fat layer exposed: Secondary | ICD-10-CM | POA: Diagnosis not present

## 2021-02-27 DIAGNOSIS — Z87891 Personal history of nicotine dependence: Secondary | ICD-10-CM | POA: Diagnosis not present

## 2021-02-28 DIAGNOSIS — J441 Chronic obstructive pulmonary disease with (acute) exacerbation: Secondary | ICD-10-CM | POA: Diagnosis not present

## 2021-03-01 ENCOUNTER — Other Ambulatory Visit: Payer: Medicare Other

## 2021-03-01 DIAGNOSIS — E782 Mixed hyperlipidemia: Secondary | ICD-10-CM

## 2021-03-01 DIAGNOSIS — E1169 Type 2 diabetes mellitus with other specified complication: Secondary | ICD-10-CM

## 2021-03-01 DIAGNOSIS — I10 Essential (primary) hypertension: Secondary | ICD-10-CM | POA: Diagnosis not present

## 2021-03-02 DIAGNOSIS — J441 Chronic obstructive pulmonary disease with (acute) exacerbation: Secondary | ICD-10-CM | POA: Diagnosis not present

## 2021-03-02 LAB — COMPREHENSIVE METABOLIC PANEL
ALT: 27 IU/L (ref 0–44)
AST: 18 IU/L (ref 0–40)
Albumin/Globulin Ratio: 1.8 (ref 1.2–2.2)
Albumin: 4 g/dL (ref 3.7–4.7)
Alkaline Phosphatase: 75 IU/L (ref 44–121)
BUN/Creatinine Ratio: 19 (ref 10–24)
BUN: 14 mg/dL (ref 8–27)
Bilirubin Total: 0.2 mg/dL (ref 0.0–1.2)
CO2: 26 mmol/L (ref 20–29)
Calcium: 9.2 mg/dL (ref 8.6–10.2)
Chloride: 101 mmol/L (ref 96–106)
Creatinine, Ser: 0.73 mg/dL — ABNORMAL LOW (ref 0.76–1.27)
Globulin, Total: 2.2 g/dL (ref 1.5–4.5)
Glucose: 97 mg/dL (ref 65–99)
Potassium: 3.8 mmol/L (ref 3.5–5.2)
Sodium: 144 mmol/L (ref 134–144)
Total Protein: 6.2 g/dL (ref 6.0–8.5)
eGFR: 95 mL/min/{1.73_m2} (ref >59)

## 2021-03-02 LAB — CBC WITH DIFFERENTIAL/PLATELET
Basophils Absolute: 0 10*3/uL (ref 0.0–0.2)
Basos: 0 %
EOS (ABSOLUTE): 0.1 10*3/uL (ref 0.0–0.4)
Eos: 0 %
Hematocrit: 38.2 % (ref 37.5–51.0)
Hemoglobin: 12 g/dL — ABNORMAL LOW (ref 13.0–17.7)
Immature Grans (Abs): 0.1 10*3/uL (ref 0.0–0.1)
Immature Granulocytes: 1 %
Lymphocytes Absolute: 3 10*3/uL (ref 0.7–3.1)
Lymphs: 19 %
MCH: 24.9 pg — ABNORMAL LOW (ref 26.6–33.0)
MCHC: 31.4 g/dL — ABNORMAL LOW (ref 31.5–35.7)
MCV: 79 fL (ref 79–97)
Monocytes Absolute: 1.2 10*3/uL — ABNORMAL HIGH (ref 0.1–0.9)
Monocytes: 8 %
Neutrophils Absolute: 10.9 10*3/uL — ABNORMAL HIGH (ref 1.4–7.0)
Neutrophils: 72 %
Platelets: 364 10*3/uL (ref 150–450)
RBC: 4.82 x10E6/uL (ref 4.14–5.80)
RDW: 15.3 % (ref 11.6–15.4)
WBC: 15.2 10*3/uL — ABNORMAL HIGH (ref 3.4–10.8)

## 2021-03-02 LAB — HEMOGLOBIN A1C
Est. average glucose Bld gHb Est-mCnc: 154 mg/dL
Hgb A1c MFr Bld: 7 % — ABNORMAL HIGH (ref 4.8–5.6)

## 2021-03-02 LAB — LIPID PANEL
Chol/HDL Ratio: 2.3 ratio (ref 0.0–5.0)
Cholesterol, Total: 182 mg/dL (ref 100–199)
HDL: 78 mg/dL (ref >39)
LDL Chol Calc (NIH): 80 mg/dL (ref 0–99)
Triglycerides: 145 mg/dL (ref 0–149)
VLDL Cholesterol Cal: 24 mg/dL (ref 5–40)

## 2021-03-02 LAB — CARDIOVASCULAR RISK ASSESSMENT

## 2021-03-03 ENCOUNTER — Encounter: Payer: Self-pay | Admitting: Family Medicine

## 2021-03-05 ENCOUNTER — Other Ambulatory Visit: Payer: Self-pay

## 2021-03-05 ENCOUNTER — Ambulatory Visit (INDEPENDENT_AMBULATORY_CARE_PROVIDER_SITE_OTHER): Payer: Medicare Other | Admitting: Family Medicine

## 2021-03-05 ENCOUNTER — Encounter: Payer: Self-pay | Admitting: Family Medicine

## 2021-03-05 VITALS — BP 136/60 | HR 80 | Temp 96.9°F | Resp 16 | Ht 66.0 in | Wt 152.0 lb

## 2021-03-05 DIAGNOSIS — I482 Chronic atrial fibrillation, unspecified: Secondary | ICD-10-CM | POA: Diagnosis not present

## 2021-03-05 DIAGNOSIS — J449 Chronic obstructive pulmonary disease, unspecified: Secondary | ICD-10-CM

## 2021-03-05 DIAGNOSIS — F411 Generalized anxiety disorder: Secondary | ICD-10-CM | POA: Diagnosis not present

## 2021-03-05 DIAGNOSIS — E1169 Type 2 diabetes mellitus with other specified complication: Secondary | ICD-10-CM

## 2021-03-05 DIAGNOSIS — M818 Other osteoporosis without current pathological fracture: Secondary | ICD-10-CM

## 2021-03-05 DIAGNOSIS — D6869 Other thrombophilia: Secondary | ICD-10-CM

## 2021-03-05 DIAGNOSIS — Z0001 Encounter for general adult medical examination with abnormal findings: Secondary | ICD-10-CM

## 2021-03-05 DIAGNOSIS — Z0000 Encounter for general adult medical examination without abnormal findings: Secondary | ICD-10-CM

## 2021-03-05 DIAGNOSIS — E782 Mixed hyperlipidemia: Secondary | ICD-10-CM | POA: Diagnosis not present

## 2021-03-05 DIAGNOSIS — D508 Other iron deficiency anemias: Secondary | ICD-10-CM | POA: Diagnosis not present

## 2021-03-05 DIAGNOSIS — J9611 Chronic respiratory failure with hypoxia: Secondary | ICD-10-CM | POA: Diagnosis not present

## 2021-03-05 DIAGNOSIS — J441 Chronic obstructive pulmonary disease with (acute) exacerbation: Secondary | ICD-10-CM | POA: Diagnosis not present

## 2021-03-05 MED ORDER — ATORVASTATIN CALCIUM 40 MG PO TABS: ORAL_TABLET | ORAL | 1 refills | Status: DC

## 2021-03-05 MED ORDER — DENOSUMAB 60 MG/ML ~~LOC~~ SOSY
60.0000 mg | PREFILLED_SYRINGE | Freq: Once | SUBCUTANEOUS | Status: AC
  Administered 2021-03-05: 60 mg via SUBCUTANEOUS

## 2021-03-05 NOTE — Patient Instructions (Addendum)
Preventive Care 76 Years and Older, Male Start iron sulfate 325 mg once daily. Follow up in 1 month for cbc.   Preventive care refers to lifestyle choices and visits with your health care provider that can promote health and wellness. This includes:  A yearly physical exam. This is also called an annual wellness visit.  Regular dental and eye exams.  Immunizations.  Screening for certain conditions.  Healthy lifestyle choices, such as: ? Eating a healthy diet. ? Getting regular exercise. ? Not using drugs or products that contain nicotine and tobacco. ? Limiting alcohol use. What can I expect for my preventive care visit? Physical exam Your health care provider will check your:  Height and weight. These may be used to calculate your BMI (body mass index). BMI is a measurement that tells if you are at a healthy weight.  Heart rate and blood pressure.  Body temperature.  Skin for abnormal spots. Counseling Your health care provider may ask you questions about your:  Past medical problems.  Family's medical history.  Alcohol, tobacco, and drug use.  Emotional well-being.  Home life and relationship well-being.  Sexual activity.  Diet, exercise, and sleep habits.  History of falls.  Memory and ability to understand (cognition).  Work and work Statistician.  Access to firearms. What immunizations do I need? Vaccines are usually given at various ages, according to a schedule. Your health care provider will recommend vaccines for you based on your age, medical history, and lifestyle or other factors, such as travel or where you work.   What tests do I need? Blood tests  Lipid and cholesterol levels. These may be checked every 5 years, or more often depending on your overall health.  Hepatitis C test.  Hepatitis B test. Screening  Lung cancer screening. You may have this screening every year starting at age 10 if you have a 30-pack-year history of smoking and  currently smoke or have quit within the past 15 years.  Colorectal cancer screening. ? All adults should have this screening starting at age 22 and continuing until age 39. ? Your health care provider may recommend screening at age 32 if you are at increased risk. ? You will have tests every 1-10 years, depending on your results and the type of screening test.  Prostate cancer screening. Recommendations will vary depending on your family history and other risks.  Genital exam to check for testicular cancer or hernias.  Diabetes screening. ? This is done by checking your blood sugar (glucose) after you have not eaten for a while (fasting). ? You may have this done every 1-3 years.  Abdominal aortic aneurysm (AAA) screening. You may need this if you are a current or former smoker.  STD (sexually transmitted disease) testing, if you are at risk. Follow these instructions at home: Eating and drinking  Eat a diet that includes fresh fruits and vegetables, whole grains, lean protein, and low-fat dairy products. Limit your intake of foods with high amounts of sugar, saturated fats, and salt.  Take vitamin and mineral supplements as recommended by your health care provider.  Do not drink alcohol if your health care provider tells you not to drink.  If you drink alcohol: ? Limit how much you have to 0-2 drinks a day. ? Be aware of how much alcohol is in your drink. In the [place] one drink equals one 12 oz bottle of beer (355 mL), one 5 oz glass of wine (148 mL), or one 1 oz  glass of hard liquor (44 mL).   Lifestyle  Take daily care of your teeth and gums. Brush your teeth every morning and night with fluoride toothpaste. Floss one time each day.  Stay active. Exercise for at least 30 minutes 5 or more days each week.  Do not use any products that contain nicotine or tobacco, such as cigarettes, e-cigarettes, and chewing tobacco. If you need help quitting, ask your health care  provider.  Do not use drugs.  If you are sexually active, practice safe sex. Use a condom or other form of protection to prevent STIs (sexually transmitted infections).  Talk with your health care provider about taking a low-dose aspirin or statin.  Find healthy ways to cope with stress, such as: ? Meditation, yoga, or listening to music. ? Journaling. ? Talking to a trusted person. ? Spending time with friends and family. Safety  Always wear your seat belt while driving or riding in a vehicle.  Do not drive: ? If you have been drinking alcohol. Do not ride with someone who has been drinking. ? When you are tired or distracted. ? While texting.  Wear a helmet and other protective equipment during sports activities.  If you have firearms in your house, make sure you follow all gun safety procedures. What's next?  Visit your health care provider once a year for an annual wellness visit.  Ask your health care provider how often you should have your eyes and teeth checked.  Stay up to date on all vaccines. This information is not intended to replace advice given to you by your health care provider. Make sure you discuss any questions you have with your health care provider. Document Revised: 09/07/2019 Document Reviewed: 12/03/2018 Elsevier Patient Education  2021 [employer].

## 2021-03-05 NOTE — Progress Notes (Signed)
Subjective:  Patient ID: Paul [name], male    DOB: [birth year]  Age: 75 y.o. MRN: [id number]  Chief Complaint  Patient presents with  . Annual Exam   HPI  Well Adult Physical: Patient here for a comprehensive physical exam.The patient reports no problems Do you take any herbs or supplements that were not prescribed by a doctor? No Are you taking calcium supplements? Vitamin C, MVI, Calcium carbonate once daily and otc vitamin D. Are you taking aspirin daily? No  Encounter for general adult medical examination without abnormal findings  Physical ("At Risk" items are starred): Patient's last physical exam was 1 year ago .  Smoking: quit 2009.  Physical Activity: Exercises at least 3 times per week (rehab);  Alcohol/Drug Use: Is a non-drinker ; No illicit drug use ;  Patient is not afflicted from Stress Incontinence and Urge Incontinence  Safety: reviewed ; Patient wears a seat belt, has smoke detectors, has carbon monoxide detectors, practices appropriate gun safety, and wears sunscreen with extended sun exposure. Dental Care: Dentures. does not have biannual cleanings, brushes daily. Ophthalmology/Optometry: Annual visit.  Hearing loss: none Vision impairments: Glasses Last PSA: 3.2 03/09/2018.  Diabetes at goal with a1c being 7.0 COPD and chronic respiratory failure. Currently on prednisone taper, symbicort and spiriva. On oxygen. Breathing has improved. Atrial Fibrillation: on eliquis, bisoprolol, cardizem cd 120 mg daily, and furosemide 40 mg once daily.  GAD: on zoloft and xanax. Doing much better.   Functional Status Survey: Is the patient deaf or have difficulty hearing?: No Does the patient have difficulty seeing, even when wearing glasses/contacts?: No Does the patient have difficulty concentrating, remembering, or making decisions?: No Does the patient have difficulty walking or climbing stairs?: No Does the patient have difficulty dressing or bathing?: No Does the patient  have difficulty doing errands alone such as visiting a doctor's office or shopping?: No   Fall Risk  03/05/2021 03/05/2021 01/25/2020 06/28/2019 08/21/2016  Falls in the past year? - 1 0 0 No  Comment - - - - Emmi Telephone Survey: data to providers prior to load  Number falls in past yr: 1 1 0 - -  Injury with Fall? 1 0 0 - -  Risk for fall due to : History of fall(s) Impaired vision;Impaired mobility - - -  Follow up Falls evaluation completed;Falls prevention discussed - - - -    Flowsheet Row Office Visit from 03/05/2021 in [place]  PHQ-2 Total Score 0     Social History   Socioeconomic History  . Marital status: Married    Spouse name: Not on file  . Number of children: 3  . Years of education: Not on file  . Highest education level: Not on file  Occupational History  . Occupation: retired    Comment: truck Geophysicist/field seismologist  Tobacco Use  . Smoking status: Former Smoker    Packs/day: 2.00    Years: 52.00    Pack years: 104.00    Types: Cigarettes    Quit date: 02/03/2009    Years since quitting: 12.1  . Smokeless tobacco: Never Used  . Tobacco comment: Counseled to remain smoke free  Vaping Use  . Vaping Use: Never used  Substance and Sexual Activity  . Alcohol use: Not Currently    Alcohol/week: 0.0 standard drinks    Comment: occ  . Drug use: No  . Sexual activity: Not on file  Other Topics Concern  . Not on file  Social History Narrative  Originally from [facility]. Previously worked driving a Restaurant manager, fast food. No pets currently. No mold exposure. 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       wears sunscreen, brushes and flosses daily, see's dentist bi-annually, has smoke/carbon monoxide detectors, wears a seatbelt and practices gun safety      Social Determinants of Health   Financial Resource Strain: Not on file  Food Insecurity: No Food Insecurity  . Worried About Charity fundraiser in the Last Year:  Never true  . Ran Out of Food in the Last Year: Never true  Transportation Needs: No Transportation Needs  . Lack of Transportation (Medical): No  . Lack of Transportation (Non-Medical): No  Physical Activity: Not on file  Stress: Not on file  Social Connections: Not on file   Past Medical History:  Diagnosis Date  . A-fib ([place])   . Anemia   . Aortic atherosclerosis ([place])   . Atrial fibrillation ([place])   . Back pain   . COPD (chronic obstructive pulmonary disease) ([place])   . Diabetes mellitus without complication ([place])   . Elevated PSA   . GAD (generalized anxiety disorder)   . High cholesterol   . Hypertension   . Osteoporosis   . Oxygen deficiency    Past Surgical History:  Procedure Laterality Date  . CATARACT EXTRACTION Right   . SKIN SURGERY     shoulders and chest    Family History  Problem Relation Age of Onset  . Heart disease Brother   . Heart disease Mother   . Heart disease Father   . Cancer Paternal Uncle    Social History   Socioeconomic History  . Marital status: Married    Spouse name: Not on file  . Number of children: 3  . Years of education: Not on file  . Highest education level: Not on file  Occupational History  . Occupation: retired    Comment: truck Geophysicist/field seismologist  Tobacco Use  . Smoking status: Former Smoker    Packs/day: 2.00    Years: 52.00    Pack years: 104.00    Types: Cigarettes    Quit date: 02/03/2009    Years since quitting: 12.1  . Smokeless tobacco: Never Used  . Tobacco comment: Counseled to remain smoke free  Vaping Use  . Vaping Use: Never used  Substance and Sexual Activity  . Alcohol use: Not Currently    Alcohol/week: 0.0 standard drinks    Comment: occ  . Drug use: No  . Sexual activity: Not on file  Other Topics Concern  . Not on file  Social History Narrative   Originally from [place]. Previously worked driving a Restaurant manager, fast food. No pets currently. No mold exposure. 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       wears sunscreen, brushes and flosses daily, see's dentist bi-annually, has smoke/carbon monoxide detectors, wears a seatbelt and practices gun safety      Social Determinants of Health   Financial Resource Strain: Not on file  Food Insecurity: No Food Insecurity  . Worried About Charity fundraiser in the Last Year: Never true  . Ran Out of Food in the Last Year: Never true  Transportation Needs: No Transportation Needs  . Lack of Transportation (Medical): No  . Lack of Transportation (Non-Medical): No  Physical Activity: Not on file  Stress:  Not on file  Social Connections: Not on file   Review of Systems  Constitutional: Negative for chills, diaphoresis, fatigue and fever.  HENT: Negative for congestion, ear pain and sore throat.   Respiratory: Negative for cough and shortness of breath.   Cardiovascular: Negative for chest pain and leg swelling.  Gastrointestinal: Negative for abdominal pain, constipation, diarrhea, nausea and vomiting.  Genitourinary: Negative for dysuria and urgency.  Musculoskeletal: Negative for arthralgias and myalgias.  Neurological: Negative for dizziness and headaches.  Psychiatric/Behavioral: Negative for dysphoric mood.     Objective:  BP 136/60   Pulse 80   Temp (!) 96.9 F (36.1 C)   Resp 16   Ht 5\' 6"  (1.676 m)   Wt 152 lb (68.9 kg)   BMI 24.53 kg/m   BP/Weight 03/05/2021 02/19/2021 5/85/2778  Systolic BP 242 353 614  Diastolic BP 60 64 60  Wt. (Lbs) 152 151.6 150  BMI 24.53 24.47 24.21    Physical Exam Vitals reviewed.  Constitutional:      Appearance: Normal appearance. He is normal weight.  HENT:     Right Ear: Tympanic membrane normal.     Left Ear: Tympanic membrane normal.     Nose: Nose normal.     Comments: 2 L PNC    Mouth/Throat:     Pharynx: No oropharyngeal exudate or posterior oropharyngeal erythema.  Cardiovascular:     Rate and Rhythm: Normal rate  and regular rhythm.     Heart sounds: Normal heart sounds. No murmur heard.   Pulmonary:     Effort: Pulmonary effort is normal.     Breath sounds: Normal breath sounds.  Abdominal:     General: Abdomen is flat. Bowel sounds are normal.     Palpations: Abdomen is soft.     Tenderness: There is no abdominal tenderness.  Skin:    Findings: No lesion.  Neurological:     Mental Status: He is alert and oriented to person, place, and time.  Psychiatric:        Mood and Affect: Mood normal.        Behavior: Behavior normal.     Lab Results  Component Value Date   WBC 15.2 (H) 03/01/2021   HGB 12.0 (L) 03/01/2021   HCT 38.2 03/01/2021   PLT 364 03/01/2021   GLUCOSE 97 03/01/2021   CHOL 182 03/01/2021   TRIG 145 03/01/2021   HDL 78 03/01/2021   LDLCALC 80 03/01/2021   ALT 27 03/01/2021   AST 18 03/01/2021   NA 144 03/01/2021   K 3.8 03/01/2021   CL 101 03/01/2021   CREATININE 0.73 (L) 03/01/2021   BUN 14 03/01/2021   CO2 26 03/01/2021   TSH 2.539 03/22/2019   INR 1.4 (H) 12/24/2020   HGBA1C 7.0 (H) 03/01/2021      Assessment & Plan:  1. Other iron deficiency anemia - Iron, TIBC and Ferritin Panel  2. Other osteoporosis without current pathological fracture - denosumab (PROLIA) injection 60 mg  3. Encounter for general adult medical examination with abnormal findings Recommend continue to work on eating healthy diet and exercise.  4. 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   5. Chronic atrial fibrillation ([place]) The current medical regimen is effective;  continue present plan and medications.  6. COPD GOLD  III  Plan to change symbicort and spiriva to breztri when his pt assistance runs out of symbicort.   7. 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 Needed due  to atrial fib.   8. Chronic respiratory failure with hypoxia ([facility]) Continue on 2-2.5 L oxygen pnc  9. GAD (generalized anxiety disorder)  The current medical regimen is  effective;  continue present plan and medications.  Body mass index is 24.53 kg/m.     This is a list of the screening recommended for you and due dates:  Health Maintenance  Topic Date Due  .  Hepatitis C: One time screening is recommended by Center for Disease Control  (CDC) for  adults born from [birth year] through 1966.   Never done  . Complete foot exam   Never done  . Eye exam for diabetics  Never done  . Urine Protein Check  Never done  . Tetanus Vaccine  Never done  . COVID-19 Vaccine (4 - Booster for Moderna series) 04/06/2021  . Hemoglobin A1C  09/01/2021  . Colon Cancer Screening  11/08/2026  . Flu Shot  Completed  . Pneumonia vaccines  Completed  . HPV Vaccine  Aged Out     AN INDIVIDUALIZED CARE PLAN: was established or reinforced today.   SELF MANAGEMENT: The patient and I together assessed ways to personally work towards obtaining the recommended goals  Support needs The patient and/or family needs were assessed and services were offered if appropriate.  Meds ordered this encounter  Medications  . atorvastatin (LIPITOR) 40 MG tablet    Sig: TAKE 1 TABLET(40 MG) BY MOUTH DAILY AFTER SUPPER    Dispense:  90 tablet    Refill:  1  . denosumab (PROLIA) injection 60 mg    Follow-up: Return in about 4 months (around 07/05/2021).  An After Visit Summary was printed and given to the patient.  [name], MD [facility] [phone]

## 2021-03-07 DIAGNOSIS — J441 Chronic obstructive pulmonary disease with (acute) exacerbation: Secondary | ICD-10-CM | POA: Diagnosis not present

## 2021-03-09 DIAGNOSIS — J441 Chronic obstructive pulmonary disease with (acute) exacerbation: Secondary | ICD-10-CM | POA: Diagnosis not present

## 2021-03-12 DIAGNOSIS — J441 Chronic obstructive pulmonary disease with (acute) exacerbation: Secondary | ICD-10-CM | POA: Diagnosis not present

## 2021-03-13 DIAGNOSIS — Z09 Encounter for follow-up examination after completed treatment for conditions other than malignant neoplasm: Secondary | ICD-10-CM | POA: Diagnosis not present

## 2021-03-13 DIAGNOSIS — J9610 Chronic respiratory failure, unspecified whether with hypoxia or hypercapnia: Secondary | ICD-10-CM | POA: Diagnosis not present

## 2021-03-13 DIAGNOSIS — Z872 Personal history of diseases of the skin and subcutaneous tissue: Secondary | ICD-10-CM | POA: Diagnosis not present

## 2021-03-13 DIAGNOSIS — Z7952 Long term (current) use of systemic steroids: Secondary | ICD-10-CM | POA: Diagnosis not present

## 2021-03-13 DIAGNOSIS — Z9981 Dependence on supplemental oxygen: Secondary | ICD-10-CM | POA: Diagnosis not present

## 2021-03-14 DIAGNOSIS — J441 Chronic obstructive pulmonary disease with (acute) exacerbation: Secondary | ICD-10-CM | POA: Diagnosis not present

## 2021-03-16 DIAGNOSIS — J441 Chronic obstructive pulmonary disease with (acute) exacerbation: Secondary | ICD-10-CM | POA: Diagnosis not present

## 2021-03-19 DIAGNOSIS — J441 Chronic obstructive pulmonary disease with (acute) exacerbation: Secondary | ICD-10-CM | POA: Diagnosis not present

## 2021-03-20 DIAGNOSIS — L97222 Non-pressure chronic ulcer of left calf with fat layer exposed: Secondary | ICD-10-CM | POA: Diagnosis not present

## 2021-03-20 DIAGNOSIS — I83022 Varicose veins of left lower extremity with ulcer of calf: Secondary | ICD-10-CM | POA: Diagnosis not present

## 2021-03-20 DIAGNOSIS — Z7952 Long term (current) use of systemic steroids: Secondary | ICD-10-CM | POA: Diagnosis not present

## 2021-03-20 DIAGNOSIS — L98492 Non-pressure chronic ulcer of skin of other sites with fat layer exposed: Secondary | ICD-10-CM | POA: Diagnosis not present

## 2021-03-20 DIAGNOSIS — L97822 Non-pressure chronic ulcer of other part of left lower leg with fat layer exposed: Secondary | ICD-10-CM | POA: Diagnosis not present

## 2021-03-21 ENCOUNTER — Telehealth: Payer: Self-pay

## 2021-03-21 ENCOUNTER — Emergency Department (INDEPENDENT_AMBULATORY_CARE_PROVIDER_SITE_OTHER): Payer: Medicare Other

## 2021-03-21 ENCOUNTER — Emergency Department (HOSPITAL_BASED_OUTPATIENT_CLINIC_OR_DEPARTMENT_OTHER)
Admission: EM | Admit: 2021-03-21 | Discharge: 2021-03-21 | Disposition: A | Discharge status: Home / Self Care | Payer: Medicare Other | Attending: Emergency Medicine | Admitting: Emergency Medicine

## 2021-03-21 ENCOUNTER — Emergency Department (HOSPITAL_BASED_OUTPATIENT_CLINIC_OR_DEPARTMENT_OTHER): Payer: Medicare Other

## 2021-03-21 ENCOUNTER — Other Ambulatory Visit: Payer: Self-pay | Admitting: *Deleted

## 2021-03-21 ENCOUNTER — Other Ambulatory Visit: Payer: Self-pay

## 2021-03-21 DIAGNOSIS — G319 Degenerative disease of nervous system, unspecified: Secondary | ICD-10-CM | POA: Diagnosis not present

## 2021-03-21 DIAGNOSIS — Z87891 Personal history of nicotine dependence: Secondary | ICD-10-CM | POA: Diagnosis not present

## 2021-03-21 DIAGNOSIS — S0081XA Abrasion of other part of head, initial encounter: Secondary | ICD-10-CM | POA: Insufficient documentation

## 2021-03-21 DIAGNOSIS — I10 Essential (primary) hypertension: Secondary | ICD-10-CM | POA: Diagnosis not present

## 2021-03-21 DIAGNOSIS — S0990XA Unspecified injury of head, initial encounter: Secondary | ICD-10-CM | POA: Diagnosis not present

## 2021-03-21 DIAGNOSIS — R55 Syncope and collapse: Secondary | ICD-10-CM | POA: Diagnosis not present

## 2021-03-21 DIAGNOSIS — E119 Type 2 diabetes mellitus without complications: Secondary | ICD-10-CM | POA: Diagnosis not present

## 2021-03-21 DIAGNOSIS — Z7951 Long term (current) use of inhaled steroids: Secondary | ICD-10-CM | POA: Diagnosis not present

## 2021-03-21 DIAGNOSIS — W228XXA Striking against or struck by other objects, initial encounter: Secondary | ICD-10-CM | POA: Diagnosis not present

## 2021-03-21 DIAGNOSIS — I4891 Unspecified atrial fibrillation: Secondary | ICD-10-CM | POA: Diagnosis not present

## 2021-03-21 DIAGNOSIS — I5032 Chronic diastolic (congestive) heart failure: Secondary | ICD-10-CM | POA: Insufficient documentation

## 2021-03-21 DIAGNOSIS — Z8616 Personal history of COVID-19: Secondary | ICD-10-CM | POA: Insufficient documentation

## 2021-03-21 DIAGNOSIS — Z79899 Other long term (current) drug therapy: Secondary | ICD-10-CM | POA: Diagnosis not present

## 2021-03-21 DIAGNOSIS — Z7901 Long term (current) use of anticoagulants: Secondary | ICD-10-CM | POA: Insufficient documentation

## 2021-03-21 DIAGNOSIS — Y92002 Bathroom of unspecified non-institutional (private) residence single-family (private) house as the place of occurrence of the external cause: Secondary | ICD-10-CM | POA: Diagnosis not present

## 2021-03-21 DIAGNOSIS — I110 Hypertensive heart disease with heart failure: Secondary | ICD-10-CM | POA: Insufficient documentation

## 2021-03-21 DIAGNOSIS — I6782 Cerebral ischemia: Secondary | ICD-10-CM | POA: Diagnosis not present

## 2021-03-21 DIAGNOSIS — J441 Chronic obstructive pulmonary disease with (acute) exacerbation: Secondary | ICD-10-CM | POA: Diagnosis not present

## 2021-03-21 LAB — CBC WITH DIFFERENTIAL/PLATELET
Abs Immature Granulocytes: 0.06 10*3/uL (ref 0.00–0.07)
Basophils Absolute: 0 10*3/uL (ref 0.0–0.1)
Basophils Relative: 0 %
Eosinophils Absolute: 0.1 10*3/uL (ref 0.0–0.5)
Eosinophils Relative: 1 %
HCT: 39.5 % (ref 39.0–52.0)
Hemoglobin: 12.1 g/dL — ABNORMAL LOW (ref 13.0–17.0)
Immature Granulocytes: 0 %
Lymphocytes Relative: 20 %
Lymphs Abs: 2.8 10*3/uL (ref 0.7–4.0)
MCH: 25.4 pg — ABNORMAL LOW (ref 26.0–34.0)
MCHC: 30.6 g/dL (ref 30.0–36.0)
MCV: 82.8 fL (ref 80.0–100.0)
Monocytes Absolute: 1.2 10*3/uL — ABNORMAL HIGH (ref 0.1–1.0)
Monocytes Relative: 8 %
Neutro Abs: 9.6 10*3/uL — ABNORMAL HIGH (ref 1.7–7.7)
Neutrophils Relative %: 71 %
Platelets: 317 10*3/uL (ref 150–400)
RBC: 4.77 MIL/uL (ref 4.22–5.81)
RDW: 16.2 % — ABNORMAL HIGH (ref 11.5–15.5)
WBC: 13.7 10*3/uL — ABNORMAL HIGH (ref 4.0–10.5)
nRBC: 0 % (ref 0.0–0.2)

## 2021-03-21 LAB — COMPREHENSIVE METABOLIC PANEL
ALT: 27 U/L (ref 0–44)
AST: 27 U/L (ref 15–41)
Albumin: 3.9 g/dL (ref 3.5–5.0)
Alkaline Phosphatase: 59 U/L (ref 38–126)
Anion gap: 10 (ref 5–15)
BUN: 12 mg/dL (ref 8–23)
CO2: 32 mmol/L (ref 22–32)
Calcium: 9.4 mg/dL (ref 8.9–10.3)
Chloride: 98 mmol/L (ref 98–111)
Creatinine, Ser: 0.85 mg/dL (ref 0.61–1.24)
GFR, Estimated: >60 mL/min (ref >60)
Glucose, Bld: 108 mg/dL — ABNORMAL HIGH (ref 70–99)
Potassium: 4 mmol/L (ref 3.5–5.1)
Sodium: 140 mmol/L (ref 135–145)
Total Bilirubin: 0.5 mg/dL (ref 0.3–1.2)
Total Protein: 7.2 g/dL (ref 6.5–8.1)

## 2021-03-21 NOTE — ED Provider Notes (Signed)
[facility] EMERGENCY DEPARTMENT Provider Note   CSN: [id number] Arrival date & time: 03/21/21  9735     History Chief Complaint  Patient presents with  . Loss of Consciousness    Passed out last night; light headed this a.m.    Paul [name] is a 75 y.o. male.  75yo M w/ extensive PMH including COPD on home O2, CAD, T2DM, A fib on anticoagulation, anxiety who p/w syncope.  Patient reports that 4 days ago, he fell asleep in a chair on his porch and then stood up and walked to the bathroom.  When he got to the bathroom, he passed out briefly and bumped his head and left arm.  The following day, he was in his recliner and got up, went to the bathroom to get ready for bed, and had a near syncopal episode but was able to hold on to the counter and did not completely pass out.  Yesterday evening, wife states that he again was in his recliner and got up, went to the bathroom and she heard his oxygen tank drop.  When she got there, he was about to pass out and she was able to hold him while he had a brief syncopal episode then regained consciousness.  This time he did not fall.  During each 1 of these episodes, he reports feeling lightheaded but has not had any associated chest pain or shortness of breath.  He states that he always sits up and waits before standing up and walking to try to prevent drop in blood pressure and each time he gets up he always turns his oxygen on to make sure that it is running during exertional activity. He takes iron which causes dark stools but denies any change in stools or bloody stools. No N/V or recent illness. No recent medication changes.  Of note, pt has had problems with syncope and near syncope in the past. He was admitted in Jan 2022 for syncope and his lasix was decreased from BID to once daily and diltiazem decreased from 180mg  to 120mg . No med changes since then.  The history is provided by the patient.  Loss of Consciousness      Past Medical  History:  Diagnosis Date  . A-fib ([place])   . Anemia   . Aortic atherosclerosis ([place])   . Atrial fibrillation ([place])   . Back pain   . COPD (chronic obstructive pulmonary disease) ([place])   . Diabetes mellitus without complication ([place])   . Elevated PSA   . GAD (generalized anxiety disorder)   . High cholesterol   . Hypertension   . Osteoporosis   . Oxygen deficiency     Patient Active Problem List   Diagnosis Date Noted  . Chronic respiratory failure with hypoxia ([place]) 01/16/2021  . Anxiety 01/14/2021  . Lingular pneumonia 01/05/2021  . COVID-19 12/24/2020  . COPD exacerbation ([place]) 12/09/2020  . COPD with acute exacerbation ([place]) 12/08/2020  . Physical deconditioning 04/12/2020  . Syncope 04/01/2020  . Chronic diastolic CHF (congestive heart failure) ([place]) 04/01/2020  . Osteoporosis with current pathological fracture 02/06/2020  . Essential hypertension 01/28/2020  . Chronic bilateral thoracic back pain 01/25/2020  . 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[place]) 01/25/2020  . HLD (hyperlipidemia) 01/05/2020  . Vertebral compression fracture ([place]) 05/29/2019  . Chronic anemia 05/29/2019  . Pulmonary nodules 05/29/2019  . Coronary artery calcification 04/06/2019  . Atherosclerosis of aorta ([place]) 04/06/2019  . Paroxysmal atrial fibrillation ([facility])   .  Acute on chronic respiratory failure with hypoxia ([place])   . Lung nodule < 6cm on CT 05/29/2016  . Former cigarette smoker 12/05/2015  . COPD GOLD  III      Past Surgical History:  Procedure Laterality Date  . CATARACT EXTRACTION Right   . SKIN SURGERY     shoulders and chest       Family History  Problem Relation Age of Onset  . Heart disease Brother   . Heart disease Mother   . Heart disease Father   . Cancer Paternal Uncle     Social History   Tobacco Use  . Smoking status: Former Smoker    Packs/day: 2.00    Years: 52.00    Pack years: 104.00    Types: Cigarettes    Quit date: 02/03/2009    Years  since quitting: 12.1  . Smokeless tobacco: Never Used  . Tobacco comment: Counseled to remain smoke free  Vaping Use  . Vaping Use: Never used  Substance Use Topics  . Alcohol use: Not Currently    Alcohol/week: 0.0 standard drinks    Comment: occ  . Drug use: No    Home Medications Prior to Admission medications   Medication Sig Start Date End Date Taking? Authorizing Provider  acetaminophen (TYLENOL) 325 MG tablet Take 2 tablets (650 mg total) by mouth every 6 (six) hours as needed for mild pain or headache (fever >/= 101). 12/28/20  Yes Blain Pais, MD  albuterol (PROAIR HFA) 108 (90 Base) MCG/ACT inhaler Inhale 2 puffs into the lungs every 6 (six) hours as needed for wheezing or shortness of breath.    Yes [provider]  albuterol (PROVENTIL) (2.5 MG/3ML) 0.083% nebulizer solution Take 3 mLs (2.5 mg total) by nebulization in the morning and at bedtime. Patient taking differently: Take 2.5 mg by nebulization 4 (four) times daily. 05/09/20  Yes Parrett, Fonnie Mu, NP  apixaban (ELIQUIS) 5 MG TABS tablet Take 1 tablet (5 mg total) by mouth 2 (two) times daily. 12/27/20  Yes Jerline Pain, MD  ascorbic acid (VITAMIN C) 500 MG tablet Take 1 tablet (500 mg total) by mouth daily. Patient taking differently: Take 1,000 mg by mouth daily. 12/29/20  Yes Adele Barthel D, MD  atorvastatin (LIPITOR) 40 MG tablet TAKE 1 TABLET(40 MG) BY MOUTH DAILY AFTER SUPPER 03/05/21  Yes Cox, Kirsten, MD  bisoprolol (ZEBETA) 5 MG tablet Take 1 tablet (5 mg total) by mouth daily. 01/19/21  Yes Jerline Pain, MD  Budeson-Glycopyrrol-Formoterol (BREZTRI AEROSPHERE) 160-9-4.8 MCG/ACT AERO Inhale 2 puffs into the lungs 2 (two) times daily. 02/19/21  Yes Tanda Rockers, MD  calcium carbonate (OS-CAL) 600 MG TABS tablet Take 600 mg by mouth daily.   Yes [provider]  Cholecalciferol (VITAMIN D) 50 MCG (2000 UT) CAPS Take 2,000 Units by mouth daily.   Yes [provider]  diltiazem  (CARDIZEM CD) 120 MG 24 hr capsule Take 1 capsule (120 mg total) by mouth daily. 01/19/21  Yes Jerline Pain, MD  ferrous sulfate 325 (65 FE) MG tablet Take 325 mg by mouth daily with breakfast.   Yes [provider]  furosemide (LASIX) 40 MG tablet Take 1 tablet (40 mg total) by mouth daily. 01/19/21  Yes Jerline Pain, MD  guaiFENesin (MUCINEX) 600 MG 12 hr tablet Take 1,200 mg by mouth 2 (two) times daily.   Yes [provider]  loratadine (CLARITIN) 10 MG tablet Take 10 mg by mouth in  the morning.   Yes [provider]  Melatonin 5 MG TABS Take 5 mg by mouth at bedtime.   Yes [provider]  Multiple Vitamin (MULTIVITAMIN WITH MINERALS) TABS tablet Take 1 tablet by mouth daily.   Yes [provider]  Niacinamide-Zn-Cu-Methfo-Se-Cr (NICOTINAMIDE PO) Take 1 tablet by mouth in the morning and at bedtime.   Yes [provider]  omeprazole (PRILOSEC OTC) 20 MG tablet Take 20 mg by mouth 2 (two) times daily before a meal.   Yes [provider]  OXYGEN Inhale 2-2.5 L/min into the lungs See admin instructions. Inhale 2 L/min of oxygen into the lungs at bedtime and when ambulatory   Yes [provider]  predniSONE (DELTASONE) 10 MG tablet Taper to 10 mg one daily 02/19/21  Yes Tanda Rockers, MD  sertraline (ZOLOFT) 50 MG tablet Take 1 tablet (50 mg total) by mouth daily. 02/08/21  Yes Cox, Kirsten, MD  sodium chloride (OCEAN) 0.65 % nasal spray Place 1 spray into the nose as needed for congestion.   Yes [provider]  ALPRAZolam (XANAX) 0.25 MG tablet Take 1 tablet (0.25 mg total) by mouth 2 (two) times daily as needed for anxiety. Patient not taking: Reported on 03/21/2021 02/08/21   CoxElnita Maxwell, MD  budesonide-formoterol [facility]) 160-4.5 MCG/ACT inhaler Inhale 2 puffs into the lungs 2 (two) times daily. Patient not taking: Reported on 03/21/2021 07/25/20   CoxElnita Maxwell, MD  denosumab (PROLIA) 60 MG/ML SOSY injection  Inject 60 mg into the skin every 6 (six) months.    [provider]  diltiazem (CARDIZEM) 60 MG tablet Take 1 tablet (60 mg total) by mouth 4 (four) times daily as needed. Patient not taking: Reported on 03/21/2021 01/19/21   Jerline Pain, MD  levalbuterol Penne Lash) 1.25 MG/3ML nebulizer solution Take 1.25 mg by nebulization every 6 (six) hours as needed for wheezing. Patient not taking: Reported on 03/21/2021 02/14/21   CoxElnita Maxwell, MD  Tiotropium Bromide Monohydrate (SPIRIVA RESPIMAT) 2.5 MCG/ACT AERS Inhale 2 puffs into the lungs daily. Patient not taking: Reported on 03/21/2021 10/03/20   Tanda Rockers, MD    Allergies    Daliresp [roflumilast], Alendronate, and Levaquin [levofloxacin]  Review of Systems   Review of Systems  Cardiovascular: Positive for syncope.   All other systems reviewed and are negative except that which was mentioned in HPI  Physical Exam Updated Vital Signs BP 139/70   Pulse 70   Temp 97.7 F (36.5 C) (Oral)   Resp (!) 27   Ht 5\' 6"  (1.676 m)   Wt 68 kg   SpO2 100%   BMI 24.21 kg/m   Physical Exam Vitals and nursing note reviewed.  Constitutional:      General: He is not in acute distress.    Appearance: Normal appearance.  HENT:     Head: Normocephalic.     Comments: Abrasion R forehead Eyes:     Conjunctiva/sclera: Conjunctivae normal.  Cardiovascular:     Rate and Rhythm: Normal rate and regular rhythm.     Heart sounds: Normal heart sounds. No murmur heard.   Pulmonary:     Effort: Pulmonary effort is normal.     Breath sounds: Normal breath sounds.  Abdominal:     General: Abdomen is flat. Bowel sounds are normal. There is no distension.     Palpations: Abdomen is soft.     Tenderness: There is no abdominal tenderness.  Musculoskeletal:     Cervical back: Neck  supple. No tenderness.     Right lower leg: No edema.     Left lower leg: No edema.  Skin:    General: Skin is warm and dry.  Neurological:     Mental Status:  He is alert and oriented to person, place, and time.     Comments: fluent  Psychiatric:        Mood and Affect: Mood normal.        Behavior: Behavior normal.     ED Results / Procedures / Treatments   Labs (all labs ordered are listed, but only abnormal results are displayed) Labs Reviewed  COMPREHENSIVE METABOLIC PANEL - Abnormal; Notable for the following components:      Result Value   Glucose, Bld 108 (*)    All other components within normal limits  CBC WITH DIFFERENTIAL/PLATELET - Abnormal; Notable for the following components:   WBC 13.7 (*)    Hemoglobin 12.1 (*)    MCH 25.4 (*)    RDW 16.2 (*)    Neutro Abs 9.6 (*)    Monocytes Absolute 1.2 (*)    All other components within normal limits    EKG EKG Interpretation  Date/Time:  Wednesday March 21 2021 09:35:51 EDT Ventricular Rate:  77 PR Interval:  138 QRS Duration: 90 QT Interval:  367 QTC Calculation: 416 R Axis:   73 Text Interpretation: Sinus rhythm Borderline ST depression, lateral leads Baseline wander in lead(s) II III aVL aVF Interpretation limited secondary to artifact but overall similar to previous Confirmed by Theotis Burrow [phone]) on 03/21/2021 9:42:19 AM   Radiology CT Head Wo Contrast  Result Date: 03/21/2021 CLINICAL DATA:  Head trauma, moderate/severe; passed out, hit head, on Eliquis. Additional history provided: Syncopal episodes. EXAM: CT HEAD WITHOUT CONTRAST TECHNIQUE: Contiguous axial images were obtained from the base of the skull through the vertex without intravenous contrast. COMPARISON:  Prior head CT examinations 01/05/2021 and earlier. Brain MRI 04/03/2020. FINDINGS: Brain: Mild cerebral atrophy. Mild ill-defined hypoattenuation within the cerebral white matter is nonspecific, but compatible with chronic small vessel ischemic disease. There is no acute intracranial hemorrhage. No demarcated cortical infarct. No extra-axial fluid collection. No evidence of intracranial mass. No midline  shift. Vascular: No hyperdense vessel.  Atherosclerotic calcifications Skull: Normal. Negative for fracture or focal lesion. Sinuses/Orbits: Visualized orbits show no acute finding. Frothy secretions within the left ethmoid air cells. Dominant left sphenoid sinus containing an air-fluid level Other: Small focus of induration within the left parietal scalp, which may reflect a new scalp hematoma or sequela of the left parietal scalp hematoma demonstrated on the prior head CT of 01/05/2021. IMPRESSION: No evidence of acute intracranial abnormality. Small focus of induration within the left parietal scalp. This may reflect a new small scalp hematoma or sequela of the left parietal scalp hematoma present on the prior head CT of 01/05/2021. Mild cerebral atrophy and chronic small vessel ischemic disease. Left ethmoid and sphenoid sinusitis. Electronically Signed   By: Kellie Simmering DO   On: 03/21/2021 10:42    Procedures Procedures   Medications Ordered in ED Medications - No data to display  ED Course  I have reviewed the triage vital signs and the nursing notes.  Pertinent labs & imaging results that were available during my care of the patient were reviewed by me and considered in my medical decision making (see chart for details).  Orthostatic VS for the past 24 hrs:  BP- Lying Pulse- Lying BP- Sitting Pulse- Sitting BP- Standing  at 0 minutes Pulse- Standing at 0 minutes  03/21/21 1051 147/66 77 134/62 77 124/65 80       MDM Rules/Calculators/A&P                          Well appearing and comfortable on exam, BP 158/67, HR 70s and sinus rhythm on EKG. No complaints. Obtained head CT due to recent head trauma on Eliquis; head CT negative acute. Basic labs reassuring w/ normal Cr, Hgb 12.1 which is improving from previous. I had a long discussion w/ cardiology, Dr. Ellyn Hack. We reviewed pt's chart which shows reassuring ECHO and carotid dopplers 1 year ago. We agreed that admitting patient for  these same studies wouldn't necessarily benefit him. He would, however, benefit from ambulatory monitoring. I called [facility] and was able to order ZIO patch which will be mailed to patient. He understands need to wear monitor and f/u with his cardiologist. I have instructed to use extreme caution at home as he is on Eliquis. He and wife voiced understanding of plan and return precautions.  Final Clinical Impression(s) / ED Diagnoses Final diagnoses:  Syncope, unspecified syncope type    Rx / DC Orders ED Discharge Orders         Ordered    LONG TERM MONITOR-LIVE TELEMETRY (3-14 DAYS)  Status:  Canceled        03/21/21 1150    LONG TERM MONITOR-LIVE TELEMETRY (3-14 DAYS)       Comments: Dr. Marlou Porch to read.   03/21/21 [place], Wenda Overland, MD 03/21/21 1322

## 2021-03-21 NOTE — ED Notes (Signed)
Pt laying down for 10 minutes for orthostatics.

## 2021-03-21 NOTE — ED Triage Notes (Addendum)
Began feeling light headed Saturday and had near syncope episode and fell, this happened again Sunday and last night.  His PCP recommended he come to the ER today.

## 2021-03-21 NOTE — ED Notes (Signed)
Pt placed in supine position to prepare for orthostatics

## 2021-03-21 NOTE — Discharge Instructions (Addendum)
A heart monitor will be mailed to your house.  Please open the package from [employer] and follow instructions on how to apply the heart monitor.  Follow-up with Dr. Etter Sjogren. Return to ER if you have any chest pain or any falls with injury because you are on a blood thinner.

## 2021-03-21 NOTE — Telephone Encounter (Signed)
Paul [name] called to report that Paul [name] has had 3 syncopal episodes over the last few days.  He had his first on Saturday when he walked to the bathroom without his oxygen. He had a near syncopal episode where he felt very dizzy and light-headed.  He replaced his oxygen and recovered quickly.  He had another episode on Sunday when he was up walking and felt like he was going to pass out but she was able to help his sit down and again he recovered very quickly.  The nurse was into visit yesterday and she checked his bp sitting and standing with very little change according to Paul [name].  He had another episode last night where he went out for at least 1 minute with him becoming limp and jerking.  He then aroused but felt very weak.  Dr. Tobie Poet advised that he go immediately to the ED for evaluation and treatment.

## 2021-03-23 DIAGNOSIS — J441 Chronic obstructive pulmonary disease with (acute) exacerbation: Secondary | ICD-10-CM | POA: Diagnosis not present

## 2021-03-26 ENCOUNTER — Other Ambulatory Visit: Payer: Self-pay

## 2021-03-26 ENCOUNTER — Ambulatory Visit (INDEPENDENT_AMBULATORY_CARE_PROVIDER_SITE_OTHER): Payer: Medicare Other

## 2021-03-26 DIAGNOSIS — E1169 Type 2 diabetes mellitus with other specified complication: Secondary | ICD-10-CM

## 2021-03-26 DIAGNOSIS — I482 Chronic atrial fibrillation, unspecified: Secondary | ICD-10-CM

## 2021-03-26 DIAGNOSIS — J449 Chronic obstructive pulmonary disease, unspecified: Secondary | ICD-10-CM | POA: Diagnosis not present

## 2021-03-26 DIAGNOSIS — E782 Mixed hyperlipidemia: Secondary | ICD-10-CM | POA: Diagnosis not present

## 2021-03-26 DIAGNOSIS — J441 Chronic obstructive pulmonary disease with (acute) exacerbation: Secondary | ICD-10-CM | POA: Diagnosis not present

## 2021-03-26 MED ORDER — BREZTRI AEROSPHERE 160-9-4.8 MCG/ACT IN AERO: 2.0000 | INHALATION_SPRAY | Freq: Two times a day (BID) | RESPIRATORY_TRACT | 3 refills | Status: DC

## 2021-03-26 NOTE — Patient Instructions (Addendum)
Visit Information  Goals Addressed   None    Patient Care Plan: [facility] Pharmacy Care Plan    Problem Identified: afib, dm, copd, hld   Priority: High  Onset Date: 02/14/2021    Long-Range Goal: Disease Management   Start Date: 02/14/2021  Expected End Date: 02/14/2022  Recent Progress: On track  Priority: High  Note:    Current Barriers:  . Unable to independently afford treatment regimen  Pharmacist Clinical Goal(s):  Marland Kitchen Over the next 90 days, patient will verbalize ability to afford treatment regimen through collaboration with PharmD and provider.   Interventions: . 1:1 collaboration with [name], MD regarding development and update of comprehensive plan of care as evidenced by provider attestation and co-signature . Inter-disciplinary care team collaboration (see longitudinal plan of care) . Comprehensive medication review performed; medication list updated in electronic medical record  Hyperlipidemia: (LDL goal < 100) -Controlled -Current treatment: . Atorvastatin 40 mg daily after supper -Medications previously tried: none reported  -Current dietary patterns: eats healthy -Current exercise habits: starting pulmonary rehab -Educated on Cholesterol goals;  Benefits of statin for ASCVD risk reduction; Importance of limiting foods high in cholesterol; -Counseled on diet and exercise extensively Recommended to continue current medication  Atrial Fibrillation (Goal: prevent stroke and major bleeding) -Controlled -CHADSVASC: 5 -Current treatment: . Rate control: diltiazem CD 120 mg daily, bisoprolol 5 mg daily . Anticoagulation: Eliquis 5 mg bid  -Medications previously tried: none reported  -Home BP and HR readings: 140/60 pulse 80  -Counseled on importance of adherence to anticoagulant exactly as prescribed; avoidance of NSAIDs due to increased bleeding risk with anticoagulants; coordinating application of Eliquis once 3% out of pocket proof received.  -Recommended  to continue current medication  COPD (Goal: control symptoms and prevent exacerbations) -Controlled -Current treatment  . Albuterol inhaler 2 puffs every 6 hours prn for wheezing/shortness of breath . albuterol every 6 hours prn  . Symbicort 160/4.5 mcg/act 2 puffs bid  . Prednisone  10 mg daily  . Spiriva 2.5 mcg/act 2 puffs into lungs daily  . Mucinex 1200 mg bid  -Medications previously tried: Trelegy  -Gold Grade: Gold 3 (FEV1 30-49%) -Exacerbations requiring treatment in last 6 months: yes -Patient reports consistent use of maintenance inhaler -Frequency of rescue inhaler use: using levalbuterol nebulizer prn -Counseled on Proper inhaler technique; Benefits of consistent maintenance inhaler use -Recommended to begin taking Breztri in place of Symbicort and Spiriva once arrives from PAP.    Patient Goals/Self-Care Activities . Over the next 90 days, patient will:  - take medications as prescribed  Follow Up Plan: Telephone follow up appointment with care management team member scheduled for:09/2021      The patient verbalized understanding of instructions, educational materials, and care plan provided today and declined offer to receive copy of patient instructions, educational materials, and care plan.  Telephone follow up appointment with pharmacy team member scheduled for: 09/2021  Burnice Logan, [facility]  Near-Syncope Near-syncope is when you suddenly get weak or dizzy, or you feel like you might pass out (faint). This may also be called presyncope. This is due to a lack of blood flow to the brain. During an episode of near-syncope, you may:  Feel dizzy, weak, or light-headed.  Feel sick to your stomach (nauseous).  See all white or all black.  See spots.  Have cold, clammy skin. This condition is caused by a sudden decrease in blood flow to the brain. This decrease can result from various causes, but  most of those causes are not dangerous. However, near-syncope may  be a sign of a serious medical problem, so it is important to seek medical care. Follow these instructions at home: Medicines  Take over-the-counter and prescription medicines only as told by your doctor.  If you are taking blood pressure or heart medicine, get up slowly and spend many minutes getting ready to sit and then stand. This can help with dizziness. General instructions  Be aware of any changes in your symptoms.  Talk with your doctor about your symptoms. You may need to have testing to find the cause of your near-syncope.  If you start to feel like you might pass out, lie down right away. Raise (elevate) your feet above the level of your heart. Breathe deeply and steadily. Wait until all of the symptoms are gone.  Have someone stay with you until you feel stable.  Do not drive, use machinery, or play sports until your doctor says it is okay.  Drink enough fluid to keep your pee (urine) pale yellow.  Keep all follow-up visits as told by your doctor. This is important. Get help right away if you:  Have a seizure.  Have pain in your: ? Chest. ? Belly (abdomen). ? Back.  Faint once or more than once.  Have a very bad headache.  Are bleeding from your mouth or butt.  Have black or tarry poop (stool).  Have a very fast or uneven heartbeat (palpitations).  Are mixed up (confused).  Have trouble walking.  Are very weak.  Have trouble seeing. These symptoms may be an emergency. Do not wait to see if the symptoms will go away. Get medical help right away. Call your local emergency services (911 in the [place] Do not drive yourself to the hospital. Summary  Near-syncope is when you suddenly get weak or dizzy, or you feel like you might pass out (faint).  This condition is caused by a lack of blood flow to the brain.  Near-syncope may be a sign of a serious medical problem, so it is important to seek medical care. This information is not intended to replace  advice given to you by your health care provider. Make sure you discuss any questions you have with your health care provider. Document Revised: 04/02/2019 Document Reviewed: 10/28/2018 Elsevier Patient Education  2021 [place] for Falls Each year, millions of people have serious injuries from falls. It is important to understand your risk for falling. Talk with your health care provider about your risk and what you can do to lower it. There are actions you can take at home to lower your risk and prevent falls. If you do have a serious fall, make sure to tell your health care provider. Falling once raises your risk of falling again. How can falls affect me? Serious injuries from falls are common. These include:  Broken bones, such as hip fractures.  Head injuries, such as traumatic brain injuries (TBI) or concussion. A fear of falling can cause you to avoid activities and stay at home. This can make your muscles weaker and actually raise your risk for a fall. What can increase my risk? There are a number of risk factors that increase your risk for falling. The more risk factors you have, the higher your risk of falling. Serious injuries from a fall happen most often to people older than age 93. Children and young adults ages 29-29 are also at higher risk. Common risk factors  include:  Weakness in the lower body.  Lack (deficiency) of vitamin D.  Being generally weak or confused due to long-term (chronic) illness.  Dizziness or balance problems.  Poor vision.  Medicines that cause dizziness or drowsiness. These can include medicines for your blood pressure, heart, anxiety, insomnia, or edema, as well as pain medicines and muscle relaxants. Other risk factors include:  Drinking alcohol.  Having had a fall in the past.  Having depression.  Having foot pain or wearing improper footwear.  Working at a dangerous job.  Having any of the following in  your home: ? Tripping hazards, such as floor clutter or loose rugs. ? Poor lighting. ? Pets.  Having dementia or memory loss. What actions can I take to lower my risk of falling? Physical activity Maintain physical fitness. Do strength and balance exercises. Consider taking a regular class to build strength and balance. Yoga and tai chi are good options. Vision Have your eyes checked every year and your vision prescription updated as needed. Walking aids and footwear  Wear nonskid shoes. Do not wear high heels.  Do not walk around the house in socks or slippers.  Use a cane or walker as told by your health care provider. Home safety  Attach secure railings on both sides of your stairs.  Install grab bars for your tub, shower, and toilet. Use a bath mat in your tub or shower.  Use good lighting in all rooms. Keep a flashlight near your bed.  Make sure there is a clear path from your bed to the bathroom. Use night-lights.  Do not use throw rugs. Make sure all carpeting is taped or tacked down securely.  Remove all clutter from walkways and stairways, including extension cords.  Repair uneven or broken steps.  Avoid walking on icy or slippery surfaces. Walk on the grass instead of on icy or slick sidewalks. Use ice melt to get rid of ice on walkways.  Use a cordless phone.      Questions to ask your health care provider  Can you help me check my risk for a fall?  Do any of my medicines make me more likely to fall?  Should I take a vitamin D supplement?  What exercises can I do to improve my strength and balance?  Should I make an appointment to have my vision checked?  Do I need a bone density test to check for weak bones or osteoporosis?  Would it help to use a cane or a walker? Where to find more information  Centers for Disease Control and Prevention, STEADI: http://www.wolf.info/  Community-Based Fall Prevention Programs: http://www.wolf.info/  National Institute on Aging:  http://kim-miller.com/ Contact a health care provider if:  You fall at home.  You are afraid of falling at home.  You feel weak, drowsy, or dizzy. Summary  Serious injuries from a fall happen most often to people older than age 65. Children and young adults ages 72-29 are also at higher risk.  Talk with your health care provider about your risks for falling and how to lower those risks.  Taking certain precautions at home can lower your risk for falling.  If you fall, always tell your health care provider. This information is not intended to replace advice given to you by your health care provider. Make sure you discuss any questions you have with your health care provider. Document Revised: 07/12/2020 Document Reviewed: 07/12/2020 Elsevier Patient Education  [place].

## 2021-03-26 NOTE — Progress Notes (Signed)
Chronic Care Management Pharmacy Note  03/26/2021 Name:  Paul [name] MRN:  [id number] DOB:  Sep 28, 1946   Plan Updates:   Patient's Zio patch to arrive today or tomorrow. Patient still reports spells of lightheadedness but denies falls.   Patient prescription submitted to [facility] and [place].   Subjective: Paul [name] is an 75 y.o. year old male who is a primary patient of Cox, Kirsten, MD.  The CCM team was consulted for assistance with disease management and care coordination needs.    Engaged with patient by telephone for follow up visit in response to provider referral for pharmacy case management and/or care coordination services.   Consent to Services:  The patient was given information about Chronic Care Management services, agreed to services, and gave verbal consent prior to initiation of services.  Please see initial visit note for detailed documentation.   Patient Care Team: [name], MD as PCP - General (Family Medicine) Jerline Pain, MD as PCP - Cardiology (Cardiology) Elsie Stain, MD as Attending Physician (Pulmonary Disease) Tanda Rockers, MD as Consulting Physician (Pulmonary Disease) [place] as Registered Nurse [facility] and [place]) Burnice Logan, [facility] as Pharmacist (Pharmacist)  Recent office visits: 03/05/2021 - Prolia. Iron panel drawn.  Recent consult visits: 03/20/2021 - apply dressing and compression. Elevate legs. Changed three times weekly.  03/13/2021 - wound care - moisturize and mssage. Follow-up as needed.  02/27/2021 - wound care - change dressing three times weekly.  02/20/2021 - wound care - change dressing three times weekly.   02/19/2021 - pulmonology - Judithann Sauger replaced Symbicort and Spiriva.  Hospital visits:  03/21/2021 - ED for syncopal episodes. ZIO patch ordered and follow-up with cardiology.  Objective:  Lab Results  Component Value Date   CREATININE 0.85 03/21/2021   BUN 12 03/21/2021    GFRNONAA >60 03/21/2021   GFRAA 107 11/20/2020   NA 140 03/21/2021   K 4.0 03/21/2021   CALCIUM 9.4 03/21/2021   CO2 32 03/21/2021   GLUCOSE 108 (H) 03/21/2021    Lab Results  Component Value Date/Time   HGBA1C 7.0 (H) 03/01/2021 08:43 AM   HGBA1C 7.2 (H) 01/31/2021 02:49 PM    Last diabetic Eye exam: No results found for: HMDIABEYEEXA  Last diabetic Foot exam: No results found for: HMDIABFOOTEX   Lab Results  Component Value Date   CHOL 182 03/01/2021   HDL 78 03/01/2021   LDLCALC 80 03/01/2021   TRIG 145 03/01/2021   CHOLHDL 2.3 03/01/2021    Hepatic Function Latest Ref Rng & Units 03/21/2021 03/01/2021 01/30/2021  Total Protein 6.5 - 8.1 g/dL 7.2 6.2 6.7  Albumin 3.5 - 5.0 g/dL 3.9 4.0 3.4(L)  AST 15 - 41 U/L _0 ALT 0 - 44 U/L _1 Alk Phosphatase 38 - 126 U/L 59 75 93  Total Bilirubin 0.3 - 1.2 mg/dL 0.5 0.2 0.2(L)  Bilirubin, Direct 0.0 - 0.2 mg/dL - - -    Lab Results  Component Value Date/Time   TSH 2.539 03/22/2019 06:21 AM    CBC Latest Ref Rng & Units 03/21/2021 03/01/2021 01/30/2021  WBC 4.0 - 10.5 K/uL 13.7(H) 15.2(H) 10.7(H)  Hemoglobin 13.0 - 17.0 g/dL 12.1(L) 12.0(L) 11.4(L)  Hematocrit 39.0 - 52.0 % 39.5 38.2 36.8(L)  Platelets 150 - 400 K/uL 317 364 347    No results found for: VD25OH  Clinical ASCVD: No  The 10-year ASCVD risk score Mikey Bussing DC Brooke Bonito., et al.,  2013) is: 46.1%   Values used to calculate the score:     Age: 41 years     Sex: Male     Is Non-Hispanic African American: No     Diabetic: Yes     Tobacco smoker: No     Systolic Blood Pressure: 740 mmHg     Is BP treated: Yes     HDL Cholesterol: 78 mg/dL     Total Cholesterol: 182 mg/dL    Depression screen [facility] 2/9 03/05/2021 02/08/2021 02/15/2020  Decreased Interest 0 0 0  Down, Depressed, Hopeless 0 3 0  PHQ - 2 Score 0 3 0  Altered sleeping 0 3 -  Tired, decreased energy 0 3 -  Change in appetite 0 0 -  Feeling bad or failure about yourself  0 3 -  Trouble concentrating  0 0 -  Moving slowly or fidgety/restless 0 3 -  Suicidal thoughts 0 0 -  PHQ-9 Score 0 15 -  Difficult doing work/chores Not difficult at all Very difficult -  Some recent data might be hidden     Social History   Tobacco Use  Smoking Status Former Smoker  . Packs/day: 2.00  . Years: 52.00  . Pack years: 104.00  . Types: Cigarettes  . Quit date: 02/03/2009  . Years since quitting: 12.1  Smokeless Tobacco Never Used  Tobacco Comment   Counseled to remain smoke free   BP Readings from Last 3 Encounters:  03/21/21 139/70  03/05/21 136/60  02/19/21 130/64   Pulse Readings from Last 3 Encounters:  03/21/21 70  03/05/21 80  02/19/21 84   Wt Readings from Last 3 Encounters:  03/21/21 150 lb (68 kg)  03/05/21 152 lb (68.9 kg)  02/19/21 151 lb 9.6 oz (68.8 kg)   BMI Readings from Last 3 Encounters:  03/21/21 24.21 kg/m  03/05/21 24.53 kg/m  02/19/21 24.47 kg/m    Assessment/Interventions: Review of patient past medical history, allergies, medications, health status, including review of consultants reports, laboratory and other test data, was performed as part of comprehensive evaluation and provision of chronic care management services.   SDOH:  (Social Determinants of Health) assessments and interventions performed: Yes  SDOH Screenings   Alcohol Screen: Low Risk   . Last Alcohol Screening Score (AUDIT): 0  Depression (PHQ2-9): Low Risk   . PHQ-2 Score: 0  Financial Resource Strain: Not on file  Food Insecurity: No Food Insecurity  . Worried About Charity fundraiser in the Last Year: Never true  . Ran Out of Food in the Last Year: Never true  Housing: Low Risk   . Last Housing Risk Score: 0  Physical Activity: Not on file  Social Connections: Not on file  Stress: Not on file  Tobacco Use: Medium Risk  . Smoking Tobacco Use: Former Smoker  . Smokeless Tobacco Use: Never Used  Transportation Needs: No Transportation Needs  . Lack of Transportation (Medical):  No  . Lack of Transportation (Non-Medical): No    CCM Care Plan  Allergies  Allergen Reactions  . Daliresp [Roflumilast] Diarrhea and Nausea And Vomiting  . Alendronate Anxiety and Other (See Comments)    Jittery/nervous  . Levaquin [Levofloxacin] Palpitations and Other (See Comments)    Made the B/P fluctuate and heart raced    Medications Reviewed Today    Reviewed by Burnice Logan, [facility] (Pharmacist) on 03/26/21 at 1556  Med List Status: <None>  Medication Order Taking? Sig Documenting Provider Last Dose Status  Informant  acetaminophen (TYLENOL) 325 MG tablet [id number] Yes Take 2 tablets (650 mg total) by mouth every 6 (six) hours as needed for mild pain or headache (fever >/= 101). Blain Pais, MD Taking Active Spouse/Significant Other  albuterol [facility] HFA) [phone] Base) MCG/ACT inhaler [id number] Yes Inhale 2 puffs into the lungs every 6 (six) hours as needed for wheezing or shortness of breath.  [provider] Taking Active Spouse/Significant Other  albuterol (PROVENTIL) (2.5 MG/3ML) 0.083% nebulizer solution [id number] Yes Take 3 mLs (2.5 mg total) by nebulization in the morning and at bedtime.  Patient taking differently: Take 2.5 mg by nebulization 4 (four) times daily.   Parrett, Fonnie Mu, NP Taking Active Spouse/Significant Other  ALPRAZolam (XANAX) 0.25 MG tablet [id number] No Take 1 tablet (0.25 mg total) by mouth 2 (two) times daily as needed for anxiety.  Patient not taking: No sig reported   Cox, Kirsten, MD Not Taking Active   apixaban (ELIQUIS) 5 MG TABS tablet [id number] Yes Take 1 tablet (5 mg total) by mouth 2 (two) times daily. Jerline Pain, MD Taking Active Spouse/Significant Other  ascorbic acid (VITAMIN C) 500 MG tablet [id number] Yes Take 1 tablet (500 mg total) by mouth daily.  Patient taking differently: Take 1,000 mg by mouth daily.   Blain Pais, MD Taking Active   atorvastatin (LIPITOR) 40 MG tablet [id number] Yes TAKE 1 TABLET(40  MG) BY MOUTH DAILY AFTER SUPPER Cox, Kirsten, MD Taking Active   bisoprolol (ZEBETA) 5 MG tablet [id number] Yes Take 1 tablet (5 mg total) by mouth daily. Jerline Pain, MD Taking Active Spouse/Significant Other  Budeson-Glycopyrrol-Formoterol (BREZTRI AEROSPHERE) 160-9-4.8 MCG/ACT AERO [id number] No Inhale 2 puffs into the lungs 2 (two) times daily.  Patient not taking: Reported on 03/26/2021   [name], MD Not Taking Active   budesonide-formoterol [facility]) 160-4.5 MCG/ACT inhaler [id number] Yes Inhale 2 puffs into the lungs 2 (two) times daily. Cox, Kirsten, MD Taking Active            Med Note [place], [place] Mar 26, 2021  3:55 PM) Finishing up current supply while [facility] sent to Patient Assistance   calcium carbonate (OS-CAL) 600 MG TABS tablet [id number] Yes Take 600 mg by mouth daily. [provider] Taking Active Spouse/Significant Other  Cholecalciferol (VITAMIN D) 50 MCG (2000 UT) CAPS [id number] Yes Take 2,000 Units by mouth daily. [provider] Taking Active Spouse/Significant Other  denosumab (PROLIA) 60 MG/ML SOSY injection [id number] Yes Inject 60 mg into the skin every 6 (six) months. [provider] Taking Active Spouse/Significant Other           Med Note Randye Lobo, Ludger Nutting   Fri Jan 19, 2021  8:28 AM)    diltiazem (CARDIZEM CD) 120 MG 24 hr capsule [id number] Yes Take 1 capsule (120 mg total) by mouth daily. Jerline Pain, MD Taking Active Spouse/Significant Other  diltiazem (CARDIZEM) 60 MG tablet [id number] No Take 1 tablet (60 mg total) by mouth 4 (four) times daily as needed.  Patient not taking: No sig reported   Jerline Pain, MD Not Taking Active   ferrous sulfate 325 (65 FE) MG tablet [id number] Yes Take 325 mg by mouth daily with breakfast. [provider] Taking Active Spouse/Significant Other  furosemide (LASIX) 40 MG tablet [id number] Yes Take 1 tablet (40 mg total) by mouth daily. Jerline Pain, MD Taking Active  Spouse/Significant Other  guaiFENesin (MUCINEX) 600 MG 12 hr tablet [id number] Yes  Take 1,200 mg by mouth 2 (two) times daily. [provider] Taking Active Spouse/Significant Other           Med Note (Alfonse Spruce, ANH T   Sat Apr 01, 2020  4:27 PM)    levalbuterol Penne Lash) 1.25 MG/3ML nebulizer solution [id number] No Take 1.25 mg by nebulization every 6 (six) hours as needed for wheezing.  Patient not taking: No sig reported   Cox, Kirsten, MD Not Taking Active   loratadine (CLARITIN) 10 MG tablet [id number] Yes Take 10 mg by mouth in the morning. [provider] Taking Active Spouse/Significant Other  Melatonin 5 MG TABS [id number] Yes Take 5 mg by mouth at bedtime. [provider] Taking Active Spouse/Significant Other  Multiple Vitamin (MULTIVITAMIN WITH MINERALS) TABS tablet [id number] Yes Take 1 tablet by mouth daily. [provider] Taking Active Spouse/Significant Other  Niacinamide-Zn-Cu-Methfo-Se-Cr (NICOTINAMIDE PO) [id number] Yes Take 1 tablet by mouth in the morning and at bedtime. [provider] Taking Active Spouse/Significant Other  omeprazole (PRILOSEC OTC) 20 MG tablet [id number] Yes Take 20 mg by mouth 2 (two) times daily before a meal. [provider] Taking Active Spouse/Significant Other  OXYGEN [id number] Yes Inhale 2-2.5 L/min into the lungs See admin instructions. Inhale 2 L/min of oxygen into the lungs at bedtime and when ambulatory [provider] Taking Active Spouse/Significant Other  predniSONE (DELTASONE) 10 MG tablet [id number] Yes Taper to 10 mg one daily Tanda Rockers, MD Taking Active   sertraline (ZOLOFT) 50 MG tablet [id number] Yes Take 1 tablet (50 mg total) by mouth daily. Cox, Kirsten, MD Taking Active   sodium chloride (OCEAN) 0.65 % nasal spray [id number] Yes Place 1 spray into the nose as needed for congestion. [provider] Taking Active Spouse/Significant Other  Tiotropium Bromide Monohydrate  (SPIRIVA RESPIMAT) 2.5 MCG/ACT AERS [id number] Yes Inhale 2 puffs into the lungs daily. Tanda Rockers, MD Taking Active            Med Note Owens Shark, [place] Mar 26, 2021  3:56 PM) Finishing up current supply while waiting on Breztri           Patient Active Problem List   Diagnosis Date Noted  . Chronic respiratory failure with hypoxia ([place]) 01/16/2021  . Anxiety 01/14/2021  . Lingular pneumonia 01/05/2021  . COVID-19 12/24/2020  . COPD exacerbation ([place]) 12/09/2020  . COPD with acute exacerbation ([place]) 12/08/2020  . Physical deconditioning 04/12/2020  . Syncope 04/01/2020  . Chronic diastolic CHF (congestive heart failure) ([place]) 04/01/2020  . Osteoporosis with current pathological fracture 02/06/2020  . Essential hypertension 01/28/2020  . Chronic bilateral thoracic back pain 01/25/2020  . 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[place]) 01/25/2020  . HLD (hyperlipidemia) 01/05/2020  . Vertebral compression fracture ([place]) 05/29/2019  . Chronic anemia 05/29/2019  . Pulmonary nodules 05/29/2019  . Coronary artery calcification 04/06/2019  . Atherosclerosis of aorta ([place]) 04/06/2019  . Paroxysmal atrial fibrillation (H[facility])   . Acute on chronic respiratory failure with hypoxia ([place])   . Lung nodule < 6cm on CT 05/29/2016  . Former cigarette smoker 12/05/2015  . COPD GOLD  III      Immunization History  Administered Date(s) Administered  . Fluad Quad(high Dose 65+) 10/02/2020  . Influenza Split 09/22/2013, 09/01/2015  . Influenza Whole 09/04/2011, 09/21/2012  . Influenza, High Dose Seasonal PF 09/02/2016, 09/10/2017, 08/31/2018, 09/24/2018, 10/21/2019  . Influenza-Unspecified 08/23/2014  . Moderna Sars-Covid-2 Vaccination 01/17/2020, 02/14/2020, 10/06/2020  . Pneumococcal Conjugate-13 01/11/2014,  12/09/2016  . Pneumococcal Polysaccharide-23 08/23/2010, 08/29/2011  . Zoster Recombinat (Shingrix) 04/04/2014    Conditions to be addressed/monitored:  Hypertension,  Hyperlipidemia, Diabetes, Atrial Fibrillation and COPD  Care Plan : [place]  Updates made by Burnice Logan, [place] since 03/26/2021 12:00 AM    Problem: afib, dm, copd, hld   Priority: High  Onset Date: 02/14/2021    Long-Range Goal: Disease Management   Start Date: 02/14/2021  Expected End Date: 02/14/2022  Recent Progress: On track  Priority: High  Note:    Current Barriers:  . Unable to independently afford treatment regimen  Pharmacist Clinical Goal(s):  Marland Kitchen Over the next 90 days, patient will verbalize ability to afford treatment regimen through collaboration with PharmD and provider.   Interventions: . 1:1 collaboration with [name], MD regarding development and update of comprehensive plan of care as evidenced by provider attestation and co-signature . Inter-disciplinary care team collaboration (see longitudinal plan of care) . Comprehensive medication review performed; medication list updated in electronic medical record  Hyperlipidemia: (LDL goal < 100) -Controlled -Current treatment: . Atorvastatin 40 mg daily after supper -Medications previously tried: none reported  -Current dietary patterns: eats healthy -Current exercise habits: starting pulmonary rehab -Educated on Cholesterol goals;  Benefits of statin for ASCVD risk reduction; Importance of limiting foods high in cholesterol; -Counseled on diet and exercise extensively Recommended to continue current medication  Atrial Fibrillation (Goal: prevent stroke and major bleeding) -Controlled -CHADSVASC: 5 -Current treatment: . Rate control: diltiazem CD 120 mg daily, bisoprolol 5 mg daily . Anticoagulation: Eliquis 5 mg bid  -Medications previously tried: none reported  -Home BP and HR readings: 140/60 pulse 80  -Counseled on importance of adherence to anticoagulant exactly as prescribed; avoidance of NSAIDs due to increased bleeding risk with anticoagulants; coordinating application of Eliquis  once 3% out of pocket proof received.  -Recommended to continue current medication  COPD (Goal: control symptoms and prevent exacerbations) -Controlled -Current treatment  . Albuterol inhaler 2 puffs every 6 hours prn for wheezing/shortness of breath . albuterol every 6 hours prn  . Symbicort 160/4.5 mcg/act 2 puffs bid  . Prednisone  10 mg daily  . Spiriva 2.5 mcg/act 2 puffs into lungs daily  . Mucinex 1200 mg bid  -Medications previously tried: Trelegy  -Gold Grade: Gold 3 (FEV1 30-49%) -Exacerbations requiring treatment in last 6 months: yes -Patient reports consistent use of maintenance inhaler -Frequency of rescue inhaler use: using levalbuterol nebulizer prn -Counseled on [place] inhaler technique; Benefits of consistent maintenance inhaler use -Recommended to begin taking Breztri in place of Symbicort and Spiriva once arrives from PAP.    Patient Goals/Self-Care Activities . Over the next 90 days, patient will:  - take medications as prescribed  Follow Up Plan: Telephone follow up appointment with care management team member scheduled for:03/2021      Medication Assistance: Breztri and Eliquisobtained through [place] and [place] and [employer] medication assistance program.  Enrollment ends 12/22/2021  Patient's preferred pharmacy is:  [facility], [place] [address] Phone: [phone] Fax: [phone]  Uses pill box? Yes Pt endorses 100% compliance  We discussed: Current pharmacy is preferred with insurance plan and patient is satisfied with pharmacy services Patient decided to: Continue current medication management strategy  Care Plan and Follow Up Patient Decision:  Patient agrees to Care Plan and Follow-up.  Plan: Telephone follow up appointment with care management team member scheduled for:  09/2021

## 2021-03-27 DIAGNOSIS — R55 Syncope and collapse: Secondary | ICD-10-CM | POA: Diagnosis not present

## 2021-03-27 DIAGNOSIS — L97222 Non-pressure chronic ulcer of left calf with fat layer exposed: Secondary | ICD-10-CM | POA: Diagnosis not present

## 2021-03-27 DIAGNOSIS — Z7952 Long term (current) use of systemic steroids: Secondary | ICD-10-CM | POA: Diagnosis not present

## 2021-03-27 DIAGNOSIS — L98492 Non-pressure chronic ulcer of skin of other sites with fat layer exposed: Secondary | ICD-10-CM | POA: Diagnosis not present

## 2021-03-27 DIAGNOSIS — I83022 Varicose veins of left lower extremity with ulcer of calf: Secondary | ICD-10-CM | POA: Diagnosis not present

## 2021-03-28 ENCOUNTER — Telehealth: Payer: Self-pay

## 2021-03-28 DIAGNOSIS — R55 Syncope and collapse: Secondary | ICD-10-CM | POA: Diagnosis not present

## 2021-03-28 DIAGNOSIS — J441 Chronic obstructive pulmonary disease with (acute) exacerbation: Secondary | ICD-10-CM | POA: Diagnosis not present

## 2021-03-28 NOTE — Progress Notes (Signed)
Chronic Care Management Pharmacy Assistant   Name: Paul [name]  MRN: [id number] DOB: 02-Jan-1946   Reason for Encounter: Medication Review for Breztri PAP    Medications: Outpatient Encounter Medications as of 03/28/2021  Medication Sig Note  . acetaminophen (TYLENOL) 325 MG tablet Take 2 tablets (650 mg total) by mouth every 6 (six) hours as needed for mild pain or headache (fever >/= 101).   Marland Kitchen albuterol (PROAIR HFA) 108 (90 Base) MCG/ACT inhaler Inhale 2 puffs into the lungs every 6 (six) hours as needed for wheezing or shortness of breath.    Marland Kitchen albuterol (PROVENTIL) (2.5 MG/3ML) 0.083% nebulizer solution Take 3 mLs (2.5 mg total) by nebulization in the morning and at bedtime. (Patient taking differently: Take 2.5 mg by nebulization 4 (four) times daily.)   . ALPRAZolam (XANAX) 0.25 MG tablet Take 1 tablet (0.25 mg total) by mouth 2 (two) times daily as needed for anxiety. (Patient not taking: No sig reported)   . apixaban (ELIQUIS) 5 MG TABS tablet Take 1 tablet (5 mg total) by mouth 2 (two) times daily.   Marland Kitchen ascorbic acid (VITAMIN C) 500 MG tablet Take 1 tablet (500 mg total) by mouth daily. (Patient taking differently: Take 1,000 mg by mouth daily.)   . atorvastatin (LIPITOR) 40 MG tablet TAKE 1 TABLET(40 MG) BY MOUTH DAILY AFTER SUPPER   . bisoprolol (ZEBETA) 5 MG tablet Take 1 tablet (5 mg total) by mouth daily.   . Budeson-Glycopyrrol-Formoterol (BREZTRI AEROSPHERE) 160-9-4.8 MCG/ACT AERO Inhale 2 puffs into the lungs 2 (two) times daily. (Patient not taking: Reported on 03/26/2021)   . budesonide-formoterol (SYMBICORT) 160-4.5 MCG/ACT inhaler Inhale 2 puffs into the lungs 2 (two) times daily. 03/26/2021: Finishing up current supply while Breztri sent to Patient Assistance   . calcium carbonate (OS-CAL) 600 MG TABS tablet Take 600 mg by mouth daily.   . Cholecalciferol (VITAMIN D) 50 MCG (2000 UT) CAPS Take 2,000 Units by mouth daily.   Marland Kitchen denosumab (PROLIA) 60 MG/ML SOSY injection Inject  60 mg into the skin every 6 (six) months.   . diltiazem (CARDIZEM CD) 120 MG 24 hr capsule Take 1 capsule (120 mg total) by mouth daily.   Marland Kitchen diltiazem (CARDIZEM) 60 MG tablet Take 1 tablet (60 mg total) by mouth 4 (four) times daily as needed. (Patient not taking: No sig reported)   . ferrous sulfate 325 (65 FE) MG tablet Take 325 mg by mouth daily with breakfast.   . furosemide (LASIX) 40 MG tablet Take 1 tablet (40 mg total) by mouth daily.   Marland Kitchen guaiFENesin (MUCINEX) 600 MG 12 hr tablet Take 1,200 mg by mouth 2 (two) times daily.   Marland Kitchen levalbuterol (XOPENEX) 1.25 MG/3ML nebulizer solution Take 1.25 mg by nebulization every 6 (six) hours as needed for wheezing. (Patient not taking: No sig reported)   . loratadine (CLARITIN) 10 MG tablet Take 10 mg by mouth in the morning.   . Melatonin 5 MG TABS Take 5 mg by mouth at bedtime.   . Multiple Vitamin (MULTIVITAMIN WITH MINERALS) TABS tablet Take 1 tablet by mouth daily.   . Niacinamide-Zn-Cu-Methfo-Se-Cr (NICOTINAMIDE PO) Take 1 tablet by mouth in the morning and at bedtime.   Marland Kitchen omeprazole (PRILOSEC OTC) 20 MG tablet Take 20 mg by mouth 2 (two) times daily before a meal.   . OXYGEN Inhale 2-2.5 L/min into the lungs See admin instructions. Inhale 2 L/min of oxygen into the lungs at bedtime and when ambulatory   . predniSONE (  DELTASONE) 10 MG tablet Taper to 10 mg one daily   . sertraline (ZOLOFT) 50 MG tablet Take 1 tablet (50 mg total) by mouth daily.   . sodium chloride (OCEAN) 0.65 % nasal spray Place 1 spray into the nose as needed for congestion.   . Tiotropium Bromide Monohydrate (SPIRIVA RESPIMAT) 2.5 MCG/ACT AERS Inhale 2 puffs into the lungs daily. 03/26/2021: Finishing up current supply while waiting on Breztri    No facility-administered encounter medications on file as of 03/28/2021.    Paul [name], CPP, noted that the patient was switching from Symbicort to [facility], she wanted me to check on the status.  I called [employer], the representative  stated the patient had been approved for the [facility], they had discontinued the Symbicort.    The patient should receive his Breztri at him home in about 7-10 business days.  I called the patient to let him know that he medication will be mailed to him directly.   I notified Paul [name], CPP as to the updated status and mailing time.   Paul [name], [place] Pharmacist Assistant [phone]

## 2021-03-29 ENCOUNTER — Telehealth: Payer: Self-pay | Admitting: Internal Medicine

## 2021-03-29 NOTE — Telephone Encounter (Signed)
Primary Pulmonologist: Paul [name] Last office visit and with whom: 02/19/21 with Paul [name] What do we see them for (pulmonary problems): Resp failure and COPD Last OV assessment/plan: Breztri  = symbicort and spiriva   Plan A = Automatic = Always=    Breztri Take 2 puffs first thing in am and then another 2 puffs about 12 hours later.    Work on inhaler technique:  relax and gently blow all the way out then take a nice smooth deep breath back in, triggering the inhaler at same time you start breathing in.  Hold for up to 5 seconds if you can. Blow out thru nose. Rinse and gargle with water when done   Plan B = Backup (to supplement plan A, not to replace it) Only use your albuterol inhaler as a rescue medication to be used if you can't catch your breath by resting or doing a relaxed purse lip breathing pattern.  - The less you use it, the better it will work when you need it. - Ok to use the inhaler up to 2 puffs  every 4 hours if you must but call for appointment if use goes up over your usual need - Don't leave home without it !!  (think of it like the spare tire for your car)   Plan C = Crisis (instead of Plan B but only if Plan B stops working) - only use your albuterol nebulizer if you first try Plan B and it fails to help > ok to use the nebulizer up to every 4 hours but if start needing it regularly call for immediate appointment  Prednisone 10 mg work it down to 1 pill daily   Make sure you check your oxygen saturation  at your highest level of activity  to be sure it stays over 90% and adjust  02 flow upward to maintain this level if needed but remember to turn it back to previous settings when you stop (to conserve your supply).   Please schedule a follow up visit in 3 months but call sooner if needed    Was appointment offered to patient (explain)?  Patient wanted recommendations first    Reason for call: Called and spoke with patient. He stated that he developed a productive cough  about a week ago. At first the phlegm was clear but turned green about 2 days ago. He has also noticed an increase in wheezing. Denied any body aches or fevers. Also denied being around anyone who has been sick recently. He is currently on a maintenance dose of prednisone 10mg .   (examples of things to ask: : When did symptoms start? Fever? Cough? Productive? Color to sputum? More sputum than usual? Wheezing? Have you needed increased oxygen? Are you taking your respiratory medications? What over the counter measures have you tried?)  Allergies  Allergen Reactions  . Daliresp [Roflumilast] Diarrhea and Nausea And Vomiting  . Alendronate Anxiety and Other (See Comments)    Jittery/nervous  . Levaquin [Levofloxacin] Palpitations and Other (See Comments)    Made the B/P fluctuate and heart raced    Immunization History  Administered Date(s) Administered  . Fluad Quad(high Dose 65+) 10/02/2020  . Influenza Split 09/22/2013, 09/01/2015  . Influenza Whole 09/04/2011, 09/21/2012  . Influenza, High Dose Seasonal PF 09/02/2016, 09/10/2017, 08/31/2018, 09/24/2018, 10/21/2019  . Influenza-Unspecified 08/23/2014  . Moderna Sars-Covid-2 Vaccination 01/17/2020, 02/14/2020, 10/06/2020  . Pneumococcal Conjugate-13 01/11/2014, 12/09/2016  . Pneumococcal Polysaccharide-23 08/23/2010, 08/29/2011  . Zoster Recombinat (Shingrix) 04/04/2014  Pharmacy is [facility] in [place].   Paul [name], can you please advise? Thanks.

## 2021-03-29 NOTE — Telephone Encounter (Signed)
Double prednisone to a max of 20 mg per day then work it back down   Remember to follow the ABCD action plan he was  given at last avs

## 2021-03-29 NOTE — Telephone Encounter (Signed)
Called and spoke with patient. He is aware to increase his prednisone to 20mg  once daily until he feels better. Advised him to call us back if he is not feeling any better. He verbalized understanding.   Nothing further needed at time of call.

## 2021-03-30 DIAGNOSIS — J441 Chronic obstructive pulmonary disease with (acute) exacerbation: Secondary | ICD-10-CM | POA: Diagnosis not present

## 2021-04-02 DIAGNOSIS — J441 Chronic obstructive pulmonary disease with (acute) exacerbation: Secondary | ICD-10-CM | POA: Diagnosis not present

## 2021-04-03 DIAGNOSIS — L97222 Non-pressure chronic ulcer of left calf with fat layer exposed: Secondary | ICD-10-CM | POA: Diagnosis not present

## 2021-04-03 DIAGNOSIS — J9610 Chronic respiratory failure, unspecified whether with hypoxia or hypercapnia: Secondary | ICD-10-CM | POA: Diagnosis not present

## 2021-04-03 DIAGNOSIS — Z7952 Long term (current) use of systemic steroids: Secondary | ICD-10-CM | POA: Diagnosis not present

## 2021-04-03 DIAGNOSIS — Z9981 Dependence on supplemental oxygen: Secondary | ICD-10-CM | POA: Diagnosis not present

## 2021-04-03 DIAGNOSIS — I83022 Varicose veins of left lower extremity with ulcer of calf: Secondary | ICD-10-CM | POA: Diagnosis not present

## 2021-04-03 DIAGNOSIS — L97822 Non-pressure chronic ulcer of other part of left lower leg with fat layer exposed: Secondary | ICD-10-CM | POA: Diagnosis not present

## 2021-04-03 DIAGNOSIS — L98492 Non-pressure chronic ulcer of skin of other sites with fat layer exposed: Secondary | ICD-10-CM | POA: Diagnosis not present

## 2021-04-04 DIAGNOSIS — J441 Chronic obstructive pulmonary disease with (acute) exacerbation: Secondary | ICD-10-CM | POA: Diagnosis not present

## 2021-04-05 ENCOUNTER — Other Ambulatory Visit: Payer: Self-pay | Admitting: Physician Assistant

## 2021-04-05 ENCOUNTER — Other Ambulatory Visit: Payer: Medicare Other

## 2021-04-05 ENCOUNTER — Other Ambulatory Visit: Payer: Self-pay

## 2021-04-05 DIAGNOSIS — R899 Unspecified abnormal finding in specimens from other organs, systems and tissues: Secondary | ICD-10-CM | POA: Diagnosis not present

## 2021-04-05 DIAGNOSIS — J441 Chronic obstructive pulmonary disease with (acute) exacerbation: Secondary | ICD-10-CM | POA: Diagnosis not present

## 2021-04-05 DIAGNOSIS — D508 Other iron deficiency anemias: Secondary | ICD-10-CM | POA: Diagnosis not present

## 2021-04-05 LAB — CBC WITH DIFFERENTIAL/PLATELET
Basophils Absolute: 0 10*3/uL (ref 0.0–0.2)
Basos: 0 %
EOS (ABSOLUTE): 0.1 10*3/uL (ref 0.0–0.4)
Eos: 1 %
Hematocrit: 35.4 % — ABNORMAL LOW (ref 37.5–51.0)
Hemoglobin: 11.1 g/dL — ABNORMAL LOW (ref 13.0–17.7)
Immature Grans (Abs): 0.1 10*3/uL (ref 0.0–0.1)
Immature Granulocytes: 1 %
Lymphocytes Absolute: 4.4 10*3/uL — ABNORMAL HIGH (ref 0.7–3.1)
Lymphs: 35 %
MCH: 25.1 pg — ABNORMAL LOW (ref 26.6–33.0)
MCHC: 31.4 g/dL — ABNORMAL LOW (ref 31.5–35.7)
MCV: 80 fL (ref 79–97)
Monocytes Absolute: 1 10*3/uL — ABNORMAL HIGH (ref 0.1–0.9)
Monocytes: 8 %
Neutrophils Absolute: 7.1 10*3/uL — ABNORMAL HIGH (ref 1.4–7.0)
Neutrophils: 55 %
Platelets: 412 10*3/uL (ref 150–450)
RBC: 4.43 x10E6/uL (ref 4.14–5.80)
RDW: 14.5 % (ref 11.6–15.4)
WBC: 12.7 10*3/uL — ABNORMAL HIGH (ref 3.4–10.8)

## 2021-04-06 LAB — IRON AND TIBC
Iron Saturation: 25 % (ref 15–55)
Iron: 61 ug/dL (ref 38–169)
Total Iron Binding Capacity: 241 ug/dL — ABNORMAL LOW (ref 250–450)
UIBC: 180 ug/dL (ref 111–343)

## 2021-04-06 LAB — FERRITIN: Ferritin: 280 ng/mL (ref 30–400)

## 2021-04-09 DIAGNOSIS — J441 Chronic obstructive pulmonary disease with (acute) exacerbation: Secondary | ICD-10-CM | POA: Diagnosis not present

## 2021-04-10 ENCOUNTER — Telehealth: Payer: Self-pay | Admitting: Internal Medicine

## 2021-04-10 MED ORDER — AZITHROMYCIN 250 MG PO TABS: ORAL_TABLET | ORAL | 0 refills | Status: DC

## 2021-04-10 NOTE — Telephone Encounter (Signed)
Tried to call Paul [name] with [facility] but unable to reach her. Left message for her to return call.

## 2021-04-10 NOTE — Telephone Encounter (Signed)
z-pak 

## 2021-04-10 NOTE — Telephone Encounter (Signed)
I have called Anna and LM on VM to make her aware of the zpak that was sent to the pts pharmacy.  Nothing further is needed.

## 2021-04-10 NOTE — Telephone Encounter (Signed)
Spoke with [name] at [facility], states that pt has increased mucinex, prednisone, albuterol as recommended by MW last week but is still not improving.   Cough is sometimes prod with yellow/green sputum, occasional wheezing when laying down and with exertion, SOB is at baseline.  Denies fever, chest pain, sinus congestion.   Anna at [facility] is requesting additional recs.   Pharmacy: [facility] in [place]. MW please advise, thanks!

## 2021-04-11 DIAGNOSIS — J441 Chronic obstructive pulmonary disease with (acute) exacerbation: Secondary | ICD-10-CM | POA: Diagnosis not present

## 2021-04-13 DIAGNOSIS — J441 Chronic obstructive pulmonary disease with (acute) exacerbation: Secondary | ICD-10-CM | POA: Diagnosis not present

## 2021-04-16 DIAGNOSIS — J441 Chronic obstructive pulmonary disease with (acute) exacerbation: Secondary | ICD-10-CM | POA: Diagnosis not present

## 2021-04-17 DIAGNOSIS — L97222 Non-pressure chronic ulcer of left calf with fat layer exposed: Secondary | ICD-10-CM | POA: Diagnosis not present

## 2021-04-17 DIAGNOSIS — Z7952 Long term (current) use of systemic steroids: Secondary | ICD-10-CM | POA: Diagnosis not present

## 2021-04-17 DIAGNOSIS — L97822 Non-pressure chronic ulcer of other part of left lower leg with fat layer exposed: Secondary | ICD-10-CM | POA: Diagnosis not present

## 2021-04-17 DIAGNOSIS — I83022 Varicose veins of left lower extremity with ulcer of calf: Secondary | ICD-10-CM | POA: Diagnosis not present

## 2021-04-18 DIAGNOSIS — J441 Chronic obstructive pulmonary disease with (acute) exacerbation: Secondary | ICD-10-CM | POA: Diagnosis not present

## 2021-04-20 DIAGNOSIS — J441 Chronic obstructive pulmonary disease with (acute) exacerbation: Secondary | ICD-10-CM | POA: Diagnosis not present

## 2021-04-23 DIAGNOSIS — J449 Chronic obstructive pulmonary disease, unspecified: Secondary | ICD-10-CM | POA: Diagnosis not present

## 2021-04-23 DIAGNOSIS — J441 Chronic obstructive pulmonary disease with (acute) exacerbation: Secondary | ICD-10-CM | POA: Diagnosis not present

## 2021-04-24 ENCOUNTER — Telehealth: Payer: Self-pay | Admitting: Cardiology

## 2021-04-24 NOTE — Telephone Encounter (Signed)
Monitor results in Dr Marlou Porch in basket to be read.

## 2021-04-24 NOTE — Telephone Encounter (Signed)
Paul [name] is calling to confirm the pt's heart monitor results were received. She states you can call the patient back with the results and she is not in need of a callback. If you still would like to speak with her you can ask for her by name informing them she is with [facility] and they will transfer the call to her. Please advise.

## 2021-04-25 DIAGNOSIS — J441 Chronic obstructive pulmonary disease with (acute) exacerbation: Secondary | ICD-10-CM | POA: Diagnosis not present

## 2021-04-25 DIAGNOSIS — J449 Chronic obstructive pulmonary disease, unspecified: Secondary | ICD-10-CM | POA: Diagnosis not present

## 2021-04-27 DIAGNOSIS — J449 Chronic obstructive pulmonary disease, unspecified: Secondary | ICD-10-CM | POA: Diagnosis not present

## 2021-04-27 DIAGNOSIS — J441 Chronic obstructive pulmonary disease with (acute) exacerbation: Secondary | ICD-10-CM | POA: Diagnosis not present

## 2021-04-30 DIAGNOSIS — J441 Chronic obstructive pulmonary disease with (acute) exacerbation: Secondary | ICD-10-CM | POA: Diagnosis not present

## 2021-04-30 DIAGNOSIS — J449 Chronic obstructive pulmonary disease, unspecified: Secondary | ICD-10-CM | POA: Diagnosis not present

## 2021-05-01 DIAGNOSIS — I83022 Varicose veins of left lower extremity with ulcer of calf: Secondary | ICD-10-CM | POA: Diagnosis not present

## 2021-05-01 DIAGNOSIS — J9610 Chronic respiratory failure, unspecified whether with hypoxia or hypercapnia: Secondary | ICD-10-CM | POA: Diagnosis not present

## 2021-05-01 DIAGNOSIS — Z87891 Personal history of nicotine dependence: Secondary | ICD-10-CM | POA: Diagnosis not present

## 2021-05-01 DIAGNOSIS — L98492 Non-pressure chronic ulcer of skin of other sites with fat layer exposed: Secondary | ICD-10-CM | POA: Diagnosis not present

## 2021-05-01 DIAGNOSIS — L97222 Non-pressure chronic ulcer of left calf with fat layer exposed: Secondary | ICD-10-CM | POA: Diagnosis not present

## 2021-05-01 DIAGNOSIS — Z7952 Long term (current) use of systemic steroids: Secondary | ICD-10-CM | POA: Diagnosis not present

## 2021-05-01 NOTE — Telephone Encounter (Signed)
   Sinus rhythm with average heart rate of 80 bpm.  Rare supraventricular tachycardia runs, paroxysmal atrial tachycardia, fastest lasting 7 beats at 141 bpm.  Symptoms of lightheadedness and dizziness are all associated with sinus rhythm. No adverse arrhythmias.  No atrial fibrillation, no pauses.  Overall reassuring.   Monitor was orders by Theotis Burrow, MD - results sent to her.

## 2021-05-01 NOTE — Telephone Encounter (Signed)
Called and spoke with pt.  Reviewed results of monitor with him.  He reports having dizziness if he stands up and starts walking right away.  If he stands and waits a minute he does not become dizzy.  Pt is keeping a log of his blood pressure and will bring them to his next office visit with Dr Marlou Porch.

## 2021-05-02 DIAGNOSIS — J441 Chronic obstructive pulmonary disease with (acute) exacerbation: Secondary | ICD-10-CM | POA: Diagnosis not present

## 2021-05-02 DIAGNOSIS — J449 Chronic obstructive pulmonary disease, unspecified: Secondary | ICD-10-CM | POA: Diagnosis not present

## 2021-05-02 NOTE — Telephone Encounter (Signed)
[facility] is calling back in regards to the conversation had on 5/11 with the pt.

## 2021-05-03 NOTE — Telephone Encounter (Signed)
Called to speak with [name] at [facility].  Was informed she is not in the office this week.  Sharee Pimple, nurse - reported they were calling to reports pt's BP has been a little low with diastolic in the 82M.  They also did orthostatics that are reported to be "unremarkable"  Pt is checking VS at home and bring a log into the office at his next OV.  He is aware to call if any questions/concerns prior to his appt.

## 2021-05-04 ENCOUNTER — Telehealth: Payer: Self-pay

## 2021-05-04 DIAGNOSIS — J449 Chronic obstructive pulmonary disease, unspecified: Secondary | ICD-10-CM | POA: Diagnosis not present

## 2021-05-04 DIAGNOSIS — J441 Chronic obstructive pulmonary disease with (acute) exacerbation: Secondary | ICD-10-CM | POA: Diagnosis not present

## 2021-05-04 NOTE — Progress Notes (Signed)
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  They also did orthostatics that are reported to be "unremarkable"  Pt is checking VS at home and bring a log into the office at his next OV.  He is aware to call if any questions/concerns prior to his appt.   04/17/21-Wound care, He reports hitting his right leg with his heel developed a wound on that side. Additionally, he developed a wound on his left leg. He was on vacation when he had these happen. He saw wound care clinic down [facility]. He was placed in dressings. He is here for follow-up. No fevers chills nausea or vomiting.   04/10/21-Pulmonology, Spoke with [name] at [facility], states that pt has increased mucinex, prednisone, albuterol as recommended by MW last week but is still not improving.  Cough is sometimes prod with yellow/green sputum, occasional wheezing when laying down and with exertion, SOB is at baseline.  Denies fever, chest pain, sinus congestion.  Zpac was sent in  04/05/21-Labs, Recommend iron sulfate. Already discussed with nurse.Pt has mild iron deficiency anemia.  Recommend iron sulfate 325 mg once daily.   03/26/21-Sara [facility] CPP-Patient's Zio patch to arrive today or tomorrow. Patient still reports spells of lightheadedness but denies falls.  Patient prescription submitted to [facility].    Recent consult visits:  02/20/21-Wound Care  Hospital visits:  none  Medications: Outpatient Encounter Medications as of 05/04/2021  Medication Sig Note  .  acetaminophen (TYLENOL) 325 MG tablet Take 2 tablets (650 mg total) by mouth every 6 (six) hours as needed for mild pain or headache (fever >/= 101).   Marland Kitchen albuterol (PROAIR HFA) 108 (90 Base) MCG/ACT inhaler Inhale 2 puffs into the lungs every 6 (six) hours as needed for wheezing or shortness of breath.    Marland Kitchen albuterol (PROVENTIL) (2.5 MG/3ML) 0.083% nebulizer solution Take 3 mLs (2.5 mg total) by nebulization in the morning and at bedtime. (Patient taking differently: Take 2.5 mg by nebulization 4 (four) times daily.)   . ALPRAZolam (XANAX) 0.25 MG tablet Take 1 tablet (0.25 mg total) by mouth 2 (two) times daily as needed for anxiety. (Patient not taking: No sig reported)   . apixaban (ELIQUIS) 5 MG TABS tablet Take 1 tablet (5 mg total) by mouth 2 (two) times daily.   Marland Kitchen ascorbic acid (VITAMIN C) 500 MG tablet Take 1 tablet (500 mg total) by mouth daily. (Patient taking differently: Take 1,000 mg by mouth daily.)   . atorvastatin (LIPITOR) 40 MG tablet TAKE 1 TABLET(40 MG) BY MOUTH DAILY AFTER SUPPER   . azithromycin (ZITHROMAX) 250 MG tablet Take 2 tablets today then one daily until gone.   . bisoprolol (ZEBETA) 5 MG tablet Take 1 tablet (5 mg total) by mouth daily.   . Budeson-Glycopyrrol-Formoterol (BREZTRI AEROSPHERE) 160-9-4.8 MCG/ACT AERO Inhale 2 puffs into the lungs 2 (two) times daily. (Patient not taking: Reported on 03/26/2021)   . budesonide-formoterol (SYMBICORT) 160-4.5 MCG/ACT inhaler Inhale 2 puffs into the lungs 2 (two) times daily. 03/26/2021: Finishing up current  supply while Breztri sent to [facility]   . calcium carbonate (OS-CAL) 600 MG TABS tablet Take 600 mg by mouth daily.   . Cholecalciferol (VITAMIN D) 50 MCG (2000 UT) CAPS Take 2,000 Units by mouth daily.   Marland Kitchen denosumab (PROLIA) 60 MG/ML SOSY injection Inject 60 mg into the skin every 6 (six) months.   . diltiazem (CARDIZEM CD) 120 MG 24 hr capsule Take 1 capsule (120 mg total) by mouth daily.   Marland Kitchen diltiazem (CARDIZEM) 60  MG tablet Take 1 tablet (60 mg total) by mouth 4 (four) times daily as needed. (Patient not taking: No sig reported)   . ferrous sulfate 325 (65 FE) MG tablet Take 325 mg by mouth daily with breakfast.   . furosemide (LASIX) 40 MG tablet Take 1 tablet (40 mg total) by mouth daily.   Marland Kitchen guaiFENesin (MUCINEX) 600 MG 12 hr tablet Take 1,200 mg by mouth 2 (two) times daily.   Marland Kitchen levalbuterol (XOPENEX) 1.25 MG/3ML nebulizer solution Take 1.25 mg by nebulization every 6 (six) hours as needed for wheezing. (Patient not taking: No sig reported)   . loratadine (CLARITIN) 10 MG tablet Take 10 mg by mouth in the morning.   . Melatonin 5 MG TABS Take 5 mg by mouth at bedtime.   . Multiple Vitamin (MULTIVITAMIN WITH MINERALS) TABS tablet Take 1 tablet by mouth daily.   . Niacinamide-Zn-Cu-Methfo-Se-Cr (NICOTINAMIDE PO) Take 1 tablet by mouth in the morning and at bedtime.   Marland Kitchen omeprazole (PRILOSEC OTC) 20 MG tablet Take 20 mg by mouth 2 (two) times daily before a meal.   . OXYGEN Inhale 2-2.5 L/min into the lungs See admin instructions. Inhale 2 L/min of oxygen into the lungs at bedtime and when ambulatory   . predniSONE (DELTASONE) 10 MG tablet Taper to 10 mg one daily   . sertraline (ZOLOFT) 50 MG tablet Take 1 tablet (50 mg total) by mouth daily.   . sodium chloride (OCEAN) 0.65 % nasal spray Place 1 spray into the nose as needed for congestion.   . Tiotropium Bromide Monohydrate (SPIRIVA RESPIMAT) 2.5 MCG/ACT AERS Inhale 2 puffs into the lungs daily. 03/26/2021: Finishing up current supply while waiting on Breztri    No facility-administered encounter medications on file as of 05/04/2021.    Recent Office Vitals: BP Readings from Last 3 Encounters:  03/21/21 139/70  03/05/21 136/60  02/19/21 130/64   Pulse Readings from Last 3 Encounters:  03/21/21 70  03/05/21 80  02/19/21 84    Wt Readings from Last 3 Encounters:  03/21/21 150 lb (68 kg)  03/05/21 152 lb (68.9 kg)  02/19/21 151 lb 9.6 oz (68.8  kg)     Kidney Function Lab Results  Component Value Date/Time   CREATININE 0.85 03/21/2021 09:44 AM   CREATININE 0.73 (L) 03/01/2021 08:43 AM   GFRNONAA >60 03/21/2021 09:44 AM   GFRAA 107 11/20/2020 12:02 PM    BMP Latest Ref Rng & Units 03/21/2021 03/01/2021 01/30/2021  Glucose 70 - 99 mg/dL 108(H) 97 136(H)  BUN 8 - 23 mg/dL 12 14 17   Creatinine 0.61 - 1.24 mg/dL 0.85 0.73(L) 0.68  BUN/Creat Ratio 10 - 24 - 19 -  Sodium 135 - 145 mmol/L 140 144 140  Potassium 3.5 - 5.1 mmol/L 4.0 3.8 3.8  Chloride 98 - 111 mmol/L 98 101 98  CO2 22 - 32 mmol/L 32 26 30  Calcium 8.9 - 10.3 mg/dL 9.4 9.2 8.9     . Current antihypertensive regimen:  Diltiazem, Furosemide, Bisoprolol   Patient verbally confirms he is taking the above medications as directed. Yes  . How often are you checking your Blood Pressure? daily  . he checks his blood pressure at different times.   . Current home BP readings: patent stated today it was 123/56  . Wrist or arm cuff:  Patient stated he uses an arm cuff  . Caffeine intake: Patient does not drink caffeine  . Salt intake: Patient does not use salt, he watches his diet   . OTC medications including pseudoephedrine or NSAIDs? Not at this time   . What recent interventions/DTPs have been made by any provider to improve Blood Pressure control since last CPP Visit: Patient stated he still has episodes where his blood pressure will drop to low, he stated he feels no different when this happens.  He stated he has upcoming appointment to see cardiology to address this situation.   . Any recent hospitalizations or ED visits since last visit with CPP? No   . What exercise is being done to improve your Blood Pressure Control?  Patient is trying to be more active, he is currently in rehab.  He has a rehab appointment today  Adherence Review: Is the patient currently on ACE/ARB medication? Yes Does the patient have >5 day gap between last estimated fill dates?  CPP to review   Star Rating Drugs:  Medication:  Last Fill: Day Supply Atorvastatin  03/05/21 90 Bisoprolol  04/16/21 90 Furosemide  02/14/21 [place]   04/27/21  [place], [place] Pharmacist Assistant [phone]

## 2021-05-07 DIAGNOSIS — J449 Chronic obstructive pulmonary disease, unspecified: Secondary | ICD-10-CM | POA: Diagnosis not present

## 2021-05-07 DIAGNOSIS — J441 Chronic obstructive pulmonary disease with (acute) exacerbation: Secondary | ICD-10-CM | POA: Diagnosis not present

## 2021-05-10 ENCOUNTER — Ambulatory Visit (INDEPENDENT_AMBULATORY_CARE_PROVIDER_SITE_OTHER): Payer: Medicare Other | Admitting: Family Medicine

## 2021-05-10 ENCOUNTER — Encounter: Payer: Self-pay | Admitting: Family Medicine

## 2021-05-10 ENCOUNTER — Other Ambulatory Visit: Payer: Self-pay

## 2021-05-10 ENCOUNTER — Telehealth: Payer: Self-pay | Admitting: Cardiology

## 2021-05-10 VITALS — BP 126/58 | HR 82 | Temp 96.4°F | Ht 66.0 in | Wt 154.0 lb

## 2021-05-10 DIAGNOSIS — R310 Gross hematuria: Secondary | ICD-10-CM

## 2021-05-10 DIAGNOSIS — I482 Chronic atrial fibrillation, unspecified: Secondary | ICD-10-CM | POA: Diagnosis not present

## 2021-05-10 DIAGNOSIS — R319 Hematuria, unspecified: Secondary | ICD-10-CM | POA: Diagnosis not present

## 2021-05-10 DIAGNOSIS — D6869 Other thrombophilia: Secondary | ICD-10-CM

## 2021-05-10 LAB — POCT URINALYSIS DIPSTICK
Bilirubin, UA: NEGATIVE
Glucose, UA: NEGATIVE
Ketones, UA: NEGATIVE
Leukocytes, UA: NEGATIVE
Nitrite, UA: NEGATIVE
Protein, UA: POSITIVE — AB
Spec Grav, UA: 1.02 (ref 1.010–1.025)
Urobilinogen, UA: NEGATIVE E.U./dL — AB
pH, UA: 6 (ref 5.0–8.0)

## 2021-05-10 LAB — CBC WITH DIFFERENTIAL/PLATELET
Basophils Absolute: 0 10*3/uL (ref 0.0–0.2)
Basos: 0 %
EOS (ABSOLUTE): 0.1 10*3/uL (ref 0.0–0.4)
Eos: 1 %
Hematocrit: 38.5 % (ref 37.5–51.0)
Hemoglobin: 12.2 g/dL — ABNORMAL LOW (ref 13.0–17.7)
Lymphocytes Absolute: 1.8 10*3/uL (ref 0.7–3.1)
Lymphs: 13 %
MCH: 24.9 pg — ABNORMAL LOW (ref 26.6–33.0)
MCHC: 31.7 g/dL (ref 31.5–35.7)
MCV: 79 fL (ref 79–97)
Monocytes Absolute: 1.3 10*3/uL — ABNORMAL HIGH (ref 0.1–0.9)
Monocytes: 9 %
Neutrophils Absolute: 11.2 10*3/uL — ABNORMAL HIGH (ref 1.4–7.0)
Neutrophils: 77 %
Platelets: 352 10*3/uL (ref 150–450)
RBC: 4.89 x10E6/uL (ref 4.14–5.80)
RDW: 16.2 % — ABNORMAL HIGH (ref 11.6–15.4)
WBC: 14.4 10*3/uL — ABNORMAL HIGH (ref 3.4–10.8)

## 2021-05-10 LAB — COMPREHENSIVE METABOLIC PANEL
ALT: 20 IU/L (ref 0–44)
AST: 21 IU/L (ref 0–40)
Albumin/Globulin Ratio: 2.2 (ref 1.2–2.2)
Albumin: 4.3 g/dL (ref 3.7–4.7)
Alkaline Phosphatase: 82 IU/L (ref 44–121)
BUN/Creatinine Ratio: 16 (ref 10–24)
BUN: 13 mg/dL (ref 8–27)
Bilirubin Total: <0.2 mg/dL (ref 0.0–1.2)
CO2: 31 mmol/L — ABNORMAL HIGH (ref 20–29)
Calcium: 9.6 mg/dL (ref 8.6–10.2)
Chloride: 98 mmol/L (ref 96–106)
Creatinine, Ser: 0.8 mg/dL (ref 0.76–1.27)
Globulin, Total: 2 g/dL (ref 1.5–4.5)
Glucose: 114 mg/dL — ABNORMAL HIGH (ref 65–99)
Potassium: 3.5 mmol/L (ref 3.5–5.2)
Sodium: 140 mmol/L (ref 134–144)
Total Protein: 6.3 g/dL (ref 6.0–8.5)
eGFR: 92 mL/min/{1.73_m2} (ref >59)

## 2021-05-10 NOTE — Progress Notes (Signed)
Acute Office Visit  Subjective:    Patient ID: Paul [name], male    DOB: [birth year], 75 y.o.   MRN: [id number]  Chief Complaint  Patient presents with  . Hematuria    HPI Patient is in today for dark colored urine- wife states yesterday it looked like tea and now it looked like there was blood in his urine. Patient denies any fever/chills/dysuria. Sxs started yesterday.  Patient is currently on Eliquis for atrial fibrillation.  He denies any pain.  Denies back pain, Specifically.  He has no history of kidney stones.  Nor has he ever had blood in his urine.  Past Medical History:  Diagnosis Date  . A-fib ([place])   . Anemia   . Aortic atherosclerosis ([place])   . Atrial fibrillation ([place])   . Back pain   . COPD (chronic obstructive pulmonary disease) ([place])   . Diabetes mellitus without complication ([place])   . Elevated PSA   . GAD (generalized anxiety disorder)   . High cholesterol   . Hypertension   . Osteoporosis   . Oxygen deficiency     Past Surgical History:  Procedure Laterality Date  . CATARACT EXTRACTION Right   . SKIN SURGERY     shoulders and chest    Family History  Problem Relation Age of Onset  . Heart disease Brother   . Heart disease Mother   . Heart disease Father   . Cancer Paternal Uncle     Social History   Socioeconomic History  . Marital status: Married    Spouse name: Not on file  . Number of children: 3  . Years of education: Not on file  . Highest education level: Not on file  Occupational History  . Occupation: retired    Comment: truck Geophysicist/field seismologist  Tobacco Use  . Smoking status: Former Smoker    Packs/day: 2.00    Years: 52.00    Pack years: 104.00    Types: Cigarettes    Quit date: 02/03/2009    Years since quitting: 12.2  . Smokeless tobacco: Never Used  . Tobacco comment: Counseled to remain smoke free  Vaping Use  . Vaping Use: Never used  Substance and Sexual Activity  . Alcohol use: Not Currently    Alcohol/week: 0.0 standard  drinks    Comment: occ  . Drug use: No  . Sexual activity: Not on file  Other Topics Concern  . Not on file  Social History Narrative   Originally from [place]. Previously worked driving a Restaurant manager, fast food. No pets currently. No mold exposure. 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       wears sunscreen, brushes and flosses daily, see's dentist bi-annually, has smoke/carbon monoxide detectors, wears a seatbelt and practices gun safety      Social Determinants of Health   Financial Resource Strain: Not on file  Food Insecurity: No Food Insecurity  . Worried About Charity fundraiser in the Last Year: Never true  . Ran Out of Food in the Last Year: Never true  Transportation Needs: No Transportation Needs  . Lack of Transportation (Medical): No  . Lack of Transportation (Non-Medical): No  Physical Activity: Not on file  Stress: Not on file  Social Connections: Not on file  Intimate Partner Violence: Not on file    Outpatient Medications Prior to Visit  Medication Sig Dispense Refill  . acetaminophen (TYLENOL) 325 MG tablet Take  2 tablets (650 mg total) by mouth every 6 (six) hours as needed for mild pain or headache (fever >/= 101).    Marland Kitchen albuterol (PROAIR HFA) 108 (90 Base) MCG/ACT inhaler Inhale 2 puffs into the lungs every 6 (six) hours as needed for wheezing or shortness of breath.     Marland Kitchen albuterol (PROVENTIL) (2.5 MG/3ML) 0.083% nebulizer solution Take 3 mLs (2.5 mg total) by nebulization in the morning and at bedtime. (Patient taking differently: Take 2.5 mg by nebulization 4 (four) times daily.) 75 mL 6  . ALPRAZolam (XANAX) 0.25 MG tablet Take 1 tablet (0.25 mg total) by mouth 2 (two) times daily as needed for anxiety. (Patient not taking: No sig reported) 60 tablet 0  . apixaban (ELIQUIS) 5 MG TABS tablet Take 1 tablet (5 mg total) by mouth 2 (two) times daily. 180 tablet 1  . ascorbic acid (VITAMIN C) 500 MG tablet  Take 1 tablet (500 mg total) by mouth daily. (Patient taking differently: Take 1,000 mg by mouth daily.)    . atorvastatin (LIPITOR) 40 MG tablet TAKE 1 TABLET(40 MG) BY MOUTH DAILY AFTER SUPPER 90 tablet 1  . azithromycin (ZITHROMAX) 250 MG tablet Take 2 tablets today then one daily until gone. 6 tablet 0  . bisoprolol (ZEBETA) 5 MG tablet Take 1 tablet (5 mg total) by mouth daily. 90 tablet 3  . Budeson-Glycopyrrol-Formoterol (BREZTRI AEROSPHERE) 160-9-4.8 MCG/ACT AERO Inhale 2 puffs into the lungs 2 (two) times daily. (Patient not taking: Reported on 03/26/2021) 32.1 g 3  . budesonide-formoterol (SYMBICORT) 160-4.5 MCG/ACT inhaler Inhale 2 puffs into the lungs 2 (two) times daily. 1 Inhaler 3  . calcium carbonate (OS-CAL) 600 MG TABS tablet Take 600 mg by mouth daily.    . Cholecalciferol (VITAMIN D) 50 MCG (2000 UT) CAPS Take 2,000 Units by mouth daily.    Marland Kitchen denosumab (PROLIA) 60 MG/ML SOSY injection Inject 60 mg into the skin every 6 (six) months.    . diltiazem (CARDIZEM CD) 120 MG 24 hr capsule Take 1 capsule (120 mg total) by mouth daily. 90 capsule 3  . diltiazem (CARDIZEM) 60 MG tablet Take 1 tablet (60 mg total) by mouth 4 (four) times daily as needed. (Patient not taking: No sig reported) 30 tablet prn  . ferrous sulfate 325 (65 FE) MG tablet Take 325 mg by mouth daily with breakfast.    . furosemide (LASIX) 40 MG tablet Take 1 tablet (40 mg total) by mouth daily. 90 tablet 3  . guaiFENesin (MUCINEX) 600 MG 12 hr tablet Take 1,200 mg by mouth 2 (two) times daily.    Marland Kitchen levalbuterol (XOPENEX) 1.25 MG/3ML nebulizer solution Take 1.25 mg by nebulization every 6 (six) hours as needed for wheezing. (Patient not taking: No sig reported) 120 mL 3  . loratadine (CLARITIN) 10 MG tablet Take 10 mg by mouth in the morning.    . Melatonin 5 MG TABS Take 5 mg by mouth at bedtime.    . Multiple Vitamin (MULTIVITAMIN WITH MINERALS) TABS tablet Take 1 tablet by mouth daily.    .  Niacinamide-Zn-Cu-Methfo-Se-Cr (NICOTINAMIDE PO) Take 1 tablet by mouth in the morning and at bedtime.    Marland Kitchen omeprazole (PRILOSEC OTC) 20 MG tablet Take 20 mg by mouth 2 (two) times daily before a meal.    . OXYGEN Inhale 2-2.5 L/min into the lungs See admin instructions. Inhale 2 L/min of oxygen into the lungs at bedtime and when ambulatory    . predniSONE (DELTASONE) 10 MG tablet  Taper to 10 mg one daily 100 tablet 0  . sertraline (ZOLOFT) 50 MG tablet Take 1 tablet (50 mg total) by mouth daily. 30 tablet 3  . sodium chloride (OCEAN) 0.65 % nasal spray Place 1 spray into the nose as needed for congestion.    . Tiotropium Bromide Monohydrate (SPIRIVA RESPIMAT) 2.5 MCG/ACT AERS Inhale 2 puffs into the lungs daily. 4 g 0   No facility-administered medications prior to visit.    Allergies  Allergen Reactions  . Daliresp [Roflumilast] Diarrhea and Nausea And Vomiting  . Alendronate Anxiety and Other (See Comments)    Jittery/nervous  . Levaquin [Levofloxacin] Palpitations and Other (See Comments)    Made the B/P fluctuate and heart raced    Review of Systems  Constitutional: Negative for chills, diaphoresis, fatigue and fever.  HENT: Negative for congestion, ear pain and sore throat.   Respiratory: Negative for cough and shortness of breath.   Cardiovascular: Negative for chest pain and leg swelling.  Gastrointestinal: Positive for abdominal pain (some discomfort). Negative for constipation, diarrhea, nausea and vomiting.  Endocrine: Positive for polyuria. Negative for polydipsia and polyphagia.  Genitourinary: Positive for frequency. Negative for difficulty urinating, dysuria and urgency.  Musculoskeletal: Negative for back pain.       Objective:    Physical Exam Vitals reviewed.  Constitutional:      Appearance: Normal appearance.  Cardiovascular:     Rate and Rhythm: Normal rate and regular rhythm.     Heart sounds: Normal heart sounds.  Pulmonary:     Effort: Pulmonary  effort is normal.     Breath sounds: Normal breath sounds.  Abdominal:     General: Bowel sounds are normal.     Palpations: Abdomen is soft. There is no mass.     Tenderness: There is no abdominal tenderness. There is no right CVA tenderness or left CVA tenderness.  Neurological:     Mental Status: He is alert and oriented to person, place, and time.  Psychiatric:        Mood and Affect: Mood normal.        Behavior: Behavior normal.     BP (!) 126/58   Pulse 82   Temp (!) 96.4 F (35.8 C)   Ht _0  (1.676 m)   Wt 154 lb (69.9 kg)   SpO2 96%   BMI 24.86 kg/m  Wt Readings from Last 3 Encounters:  05/10/21 154 lb (69.9 kg)  03/21/21 150 lb (68 kg)  03/05/21 152 lb (68.9 kg)    Health Maintenance Due  Topic Date Due  . FOOT EXAM  Never done  . OPHTHALMOLOGY EXAM  Never done  . URINE MICROALBUMIN  Never done  . Hepatitis C Screening  Never done  . TETANUS/TDAP  Never done  . COVID-19 Vaccine (4 - Booster for Moderna series) 01/06/2021    There are no preventive care reminders to display for this patient.   Lab Results  Component Value Date   TSH 2.539 03/22/2019   Lab Results  Component Value Date   WBC 12.7 (H) 04/05/2021   HGB 11.1 (L) 04/05/2021   HCT 35.4 (L) 04/05/2021   MCV 80 04/05/2021   PLT 412 04/05/2021   Lab Results  Component Value Date   NA 140 03/21/2021   K 4.0 03/21/2021   CO2 32 03/21/2021   GLUCOSE 108 (H) 03/21/2021   BUN 12 03/21/2021   CREATININE 0.85 03/21/2021   BILITOT 0.5 03/21/2021   ALKPHOS 59 03/21/2021  AST 27 03/21/2021   ALT 27 03/21/2021   PROT 7.2 03/21/2021   ALBUMIN 3.9 03/21/2021   CALCIUM 9.4 03/21/2021   ANIONGAP 10 03/21/2021   EGFR 95 03/01/2021   Lab Results  Component Value Date   CHOL 182 03/01/2021   Lab Results  Component Value Date   HDL 78 03/01/2021   Lab Results  Component Value Date   LDLCALC 80 03/01/2021   Lab Results  Component Value Date   TRIG 145 03/01/2021   Lab Results   Component Value Date   CHOLHDL 2.3 03/01/2021   Lab Results  Component Value Date   HGBA1C 7.0 (H) 03/01/2021       Assessment & Plan:  1. Gross hematuria - POCT urinalysis dipstick - CBC with Differential/Platelet (STAT) - Comprehensive metabolic panel (STAT) - Urine Culture - Ambulatory referral to Urology ([place]  URGENT REFERRAL. I WOULD LIKE PT SEEN THIS WEEK OR EARLY NEXT WEEK. IF STAT HB IS VERY LOW, I MAY END UP SENDING TO ED.   2. Chronic atrial fibrillation ([place]) HOLD ELIQUIS.   3. Acquired thrombophilia ([place])  HOLD ELIQUIS.    Orders Placed This Encounter  Procedures  . Urine Culture  . CBC with Differential/Platelet  . Comprehensive metabolic panel  . Ambulatory referral to Urology  . POCT urinalysis dipstick     Follow-up: No follow-ups on file.  An After Visit Summary was printed and given to the patient.  [name], MD [facility] [phone]

## 2021-05-10 NOTE — Telephone Encounter (Signed)
New Message:    Pt is passing a large amount of blood in his urine. Thy wants to know if he can be off of his Eliqius until his appt on Monday afternoon with his Urinologist?

## 2021-05-10 NOTE — Patient Instructions (Signed)
HOLD ELIQUIS  REFER TO UROLOGY ([facility])

## 2021-05-11 ENCOUNTER — Other Ambulatory Visit: Payer: Self-pay | Admitting: *Deleted

## 2021-05-11 ENCOUNTER — Telehealth: Payer: Self-pay | Admitting: Cardiology

## 2021-05-11 DIAGNOSIS — I480 Paroxysmal atrial fibrillation: Secondary | ICD-10-CM

## 2021-05-11 DIAGNOSIS — Z79899 Other long term (current) drug therapy: Secondary | ICD-10-CM

## 2021-05-11 NOTE — Telephone Encounter (Signed)
Pt c/o medication issue:  1. Name of Medication: apixaban (ELIQUIS) 5 MG TABS tablet   2. How are you currently taking this medication (dosage and times per day)? Take 1 tablet (5 mg total) by mouth 2 (two) times daily.  3. Are you having a reaction (difficulty breathing--STAT)?  n/a  4. What is your medication issue?  PT WOULD LIKE TO KNOW IF ITS OK FOR HIM TO STOP ELIQUIS UNTIL HE IS ABLE TO SEE A UROLOGIST ON MONDAY FOR BLOOD IN URINE.

## 2021-05-11 NOTE — Progress Notes (Signed)
Let's have him see Dr. Quentin Ore with EP in consultation to discuss his candidacy for Watchman device which would allow him to come off of anticoagulation. Candee Furbish, MD

## 2021-05-11 NOTE — Telephone Encounter (Signed)
Please see telephone note from 05/10/2021 for further information.

## 2021-05-11 NOTE — Telephone Encounter (Signed)
Spoke with wife [name] (ok per DPR)and advised per Dr Marlou Porch ok to hold the Eliquis for now until he sees the urologist on Monday.  Aware it does put him at an increased risk of stroke.   She would like to hold off on the referral to Dr Quentin Ore for now.  This is scheduled to see Dr Marlou Porch Thursday and will discuss further at that time.

## 2021-05-11 NOTE — Telephone Encounter (Signed)
OK to hold Eliquis until he sees urology given bleeding. Obviously this can increase risk of stroke.  Let's have him see Dr. Quentin Ore with EP in consultation to discuss his candidacy for Watchman device which would allow him to come off of anticoagulation. Candee Furbish, MD

## 2021-05-12 LAB — URINE CULTURE: Organism ID, Bacteria: NO GROWTH

## 2021-05-14 DIAGNOSIS — R310 Gross hematuria: Secondary | ICD-10-CM | POA: Diagnosis not present

## 2021-05-14 DIAGNOSIS — R972 Elevated prostate specific antigen [PSA]: Secondary | ICD-10-CM | POA: Diagnosis not present

## 2021-05-15 DIAGNOSIS — L97222 Non-pressure chronic ulcer of left calf with fat layer exposed: Secondary | ICD-10-CM | POA: Diagnosis not present

## 2021-05-15 DIAGNOSIS — L97822 Non-pressure chronic ulcer of other part of left lower leg with fat layer exposed: Secondary | ICD-10-CM | POA: Diagnosis not present

## 2021-05-15 DIAGNOSIS — Z7952 Long term (current) use of systemic steroids: Secondary | ICD-10-CM | POA: Diagnosis not present

## 2021-05-15 DIAGNOSIS — I83022 Varicose veins of left lower extremity with ulcer of calf: Secondary | ICD-10-CM | POA: Diagnosis not present

## 2021-05-17 ENCOUNTER — Ambulatory Visit (INDEPENDENT_AMBULATORY_CARE_PROVIDER_SITE_OTHER): Payer: Medicare Other | Admitting: Cardiology

## 2021-05-17 ENCOUNTER — Encounter: Payer: Self-pay | Admitting: Cardiology

## 2021-05-17 ENCOUNTER — Other Ambulatory Visit: Payer: Self-pay

## 2021-05-17 VITALS — BP 124/60 | HR 82 | Ht 66.0 in | Wt 153.8 lb

## 2021-05-17 DIAGNOSIS — I480 Paroxysmal atrial fibrillation: Secondary | ICD-10-CM

## 2021-05-17 DIAGNOSIS — R55 Syncope and collapse: Secondary | ICD-10-CM | POA: Diagnosis not present

## 2021-05-17 NOTE — Progress Notes (Addendum)
Cardiology Office Note:    Date:  05/17/2021   ID:  Paul [name], DOB 1945/12/27, MRN [id number]  PCP:  [name], MD   [facility] Providers Cardiologist:  Candee Furbish, MD     Referring MD: [name], MD    History of Present Illness:    Paul [name] is a 75 y.o. male here for the follow-up of severe COPD paroxysmal atrial fibrillation on chronic anticoagulation, hematuria, followed in pulmonary clinic with severely reduced FEV1, 3 1% in 2017.  Previously hospitalized with an episode of syncope suspected to be dehydration orthostasis in the setting of pneumonia.  CT of head was negative.  Diltiazem was previously decreased to 120 mg a day.  Lasix was also asked to be pulled back because of syncope.  Monitor results as below overall reassuring.  See below for further details.  Seeing their daughter as well.  They are going to [employer] for [employer] day.  Past Medical History:  Diagnosis Date   A-fib ([place])    Anemia    Aortic atherosclerosis (HCC)    Atrial fibrillation (HCC)    Back pain    COPD (chronic obstructive pulmonary disease) (HCC)    Diabetes mellitus without complication (HCC)    Elevated PSA    GAD (generalized anxiety disorder)    High cholesterol    Hypertension    Osteoporosis    Oxygen deficiency     Past Surgical History:  Procedure Laterality Date   CATARACT EXTRACTION Right    SKIN SURGERY     shoulders and chest    Current Medications: Current Meds  Medication Sig   acetaminophen (TYLENOL) 325 MG tablet Take 2 tablets (650 mg total) by mouth every 6 (six) hours as needed for mild pain or headache (fever >/= 101).   albuterol (PROAIR HFA) 108 (90 Base) MCG/ACT inhaler Inhale 2 puffs into the lungs every 6 (six) hours as needed for wheezing or shortness of breath.    albuterol (PROVENTIL) (2.5 MG/3ML) 0.083% nebulizer solution Take 3 mLs (2.5 mg total) by nebulization in the morning and at bedtime.   ALPRAZolam (XANAX) 0.25 MG tablet  Take 1 tablet (0.25 mg total) by mouth 2 (two) times daily as needed for anxiety.   apixaban (ELIQUIS) 5 MG TABS tablet Take 1 tablet (5 mg total) by mouth 2 (two) times daily.   ascorbic acid (VITAMIN C) 500 MG tablet Take 1 tablet (500 mg total) by mouth daily.   atorvastatin (LIPITOR) 40 MG tablet TAKE 1 TABLET(40 MG) BY MOUTH DAILY AFTER SUPPER   bisoprolol (ZEBETA) 5 MG tablet Take 1 tablet (5 mg total) by mouth daily.   Budeson-Glycopyrrol-Formoterol (BREZTRI AEROSPHERE) 160-9-4.8 MCG/ACT AERO Inhale 2 puffs into the lungs 2 (two) times daily.   calcium carbonate (OS-CAL) 600 MG TABS tablet Take 600 mg by mouth daily.   Cholecalciferol (VITAMIN D) 50 MCG (2000 UT) CAPS Take 2,000 Units by mouth daily.   denosumab (PROLIA) 60 MG/ML SOSY injection Inject 60 mg into the skin every 6 (six) months.   diltiazem (CARDIZEM) 60 MG tablet Take 1 tablet (60 mg total) by mouth 4 (four) times daily as needed.   ferrous sulfate 325 (65 FE) MG tablet Take 325 mg by mouth daily with breakfast.   furosemide (LASIX) 40 MG tablet Take 1 tablet (40 mg total) by mouth daily.   guaiFENesin (MUCINEX) 600 MG 12 hr tablet Take 1,200 mg by mouth 2 (two) times daily.   levalbuterol (XOPENEX) 1.25 MG/3ML nebulizer solution  Take 1.25 mg by nebulization every 6 (six) hours as needed for wheezing.   loratadine (CLARITIN) 10 MG tablet Take 10 mg by mouth in the morning.   Melatonin 5 MG TABS Take 5 mg by mouth at bedtime.   Multiple Vitamin (MULTIVITAMIN WITH MINERALS) TABS tablet Take 1 tablet by mouth daily.   Niacinamide-Zn-Cu-Methfo-Se-Cr (NICOTINAMIDE PO) Take 1 tablet by mouth in the morning and at bedtime.   omeprazole (PRILOSEC OTC) 20 MG tablet Take 20 mg by mouth 2 (two) times daily before a meal.   OXYGEN Inhale 2-2.5 L/min into the lungs See admin instructions. Inhale 2 L/min of oxygen into the lungs at bedtime and when ambulatory   predniSONE (DELTASONE) 10 MG tablet Taper to 10 mg one daily   sertraline  (ZOLOFT) 50 MG tablet Take 1 tablet (50 mg total) by mouth daily.   sodium chloride (OCEAN) 0.65 % nasal spray Place 1 spray into the nose as needed for congestion.   [DISCONTINUED] diltiazem (CARDIZEM CD) 120 MG 24 hr capsule Take 1 capsule (120 mg total) by mouth daily.     Allergies:   Daliresp [roflumilast], Alendronate, and Levaquin [levofloxacin]   Social History   Socioeconomic History   Marital status: Married    Spouse name: Not on file   Number of children: 3   Years of education: Not on file   Highest education level: Not on file  Occupational History   Occupation: retired    Comment: truck driver  Tobacco Use   Smoking status: Former Smoker    Packs/day: 2.00    Years: 52.00    Pack years: 104.00    Types: Cigarettes    Quit date: 02/03/2009    Years since quitting: 12.2   Smokeless tobacco: Never Used   Tobacco comment: Counseled to remain smoke free  Vaping Use   Vaping Use: Never used  Substance and Sexual Activity   Alcohol use: Not Currently    Alcohol/week: 0.0 standard drinks    Comment: occ   Drug use: No   Sexual activity: Not on file  Other Topics Concern   Not on file  Social History Narrative   Originally from [place]. Previously worked driving a Restaurant manager, fast food. No pets currently. No mold exposure. 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       wears sunscreen, brushes and flosses daily, see's dentist bi-annually, has smoke/carbon monoxide detectors, wears a seatbelt and practices gun safety      Social Determinants of Health   Financial Resource Strain: Not on file  Food Insecurity: No Food Insecurity   Worried About Charity fundraiser in the Last Year: Never true   Ran Out of Food in the Last Year: Never true  Transportation Needs: No Transportation Needs   Lack of Transportation (Medical): No   Lack of Transportation (Non-Medical): No  Physical Activity: Not on file  Stress: Not  on file  Social Connections: Not on file     Family History: The patient's family history includes Cancer in his paternal uncle; Heart disease in his brother, father, and mother.  ROS:   Please see the history of present illness.     All other systems reviewed and are negative.  EKGs/Labs/Other Studies Reviewed:    The following studies were reviewed today: Zio  Sinus rhythm with average heart rate of 80 bpm. Rare supraventricular tachycardia runs, paroxysmal atrial tachycardia, fastest lasting 7 beats  at 141 bpm. Symptoms of lightheadedness and dizziness are all associated with sinus rhythm. No adverse arrhythmias. No atrial fibrillation, no pauses. Overall reassuring.   EKG: 03/21/2021-sinus rhythm 77 no other abnormalities  Recent Labs: 01/30/2021: B Natriuretic Peptide 90.9 05/10/2021: ALT 20; BUN 13; Creatinine, Ser 0.80; Hemoglobin 12.2; Platelets 352; Potassium 3.5; Sodium 140  Recent Lipid Panel    Component Value Date/Time   CHOL 182 03/01/2021 0843   TRIG 145 03/01/2021 0843   HDL 78 03/01/2021 0843   CHOLHDL 2.3 03/01/2021 0843   LDLCALC 80 03/01/2021 0843     Risk Assessment/Calculations:      Physical Exam:    VS:  BP 124/60   Pulse 82   Ht 5\' 6"  (1.676 m)   Wt 153 lb 12.8 oz (69.8 kg)   SpO2 99%   BMI 24.82 kg/m     Wt Readings from Last 3 Encounters:  05/17/21 153 lb 12.8 oz (69.8 kg)  05/10/21 154 lb (69.9 kg)  03/21/21 150 lb (68 kg)     GEN:  Well nourished, well developed in no acute distress HEENT: Normal NECK: No JVD; No carotid bruits LYMPHATICS: No lymphadenopathy CARDIAC: RRR, no murmurs, rubs, gallops RESPIRATORY:  Clear to auscultation without rales, wheezing or rhonchi  ABDOMEN: Soft, non-tender, non-distended MUSCULOSKELETAL:  No edema; No deformity, several bruises SKIN: Warm and dry NEUROLOGIC:  Alert and oriented x 3 PSYCHIATRIC:  Normal affect   ASSESSMENT:    1. Paroxysmal atrial fibrillation (HCC)   2. Syncope,  unspecified syncope type    PLAN:    In order of problems listed above:  Paroxysmal atrial fibrillation - Diltiazem previously decreased to 120 mg a day.  As short acting 60 mg to take as needed.  He is on low-dose bisoprolol 5 mg a day as well.  Because he is still having some orthostatic type symptoms, i.e. he has to sit then stand for a few seconds to calibrate before moving because he can sometimes get very dizzy, I will stop his diltiazem CD1 120 mg a day.  We will continue with bisoprolol 5 mg a day.  He showed me a list of his blood pressures.  Some of them have diastolics in the 16X but systolics mostly are ranging between 101 130.  Chronic anticoagulation - On Eliquis.  He was having hematuria.  Seeing urologist.  Hemoglobin 11.1 creatinine 0.8.  Because of this, I referred him to electrophysiology, Dr. Quentin Ore to discuss watchman device in July.  If hematuria once again returns, he knows to hold his Eliquis.  He is getting this evaluated by urology with cystoscopy and CT scan.  Severe COPD - Followed closely in pulmonary clinic.  Has had several hospitalizations in the past.  Syncope monitor reassuring.  Pulled back on his Lasix.  Could have an orthostatic component or dehydration.  Home health nurse reported diastolics as low as the 09U previously.  However orthostatics were reported to be unremarkable.  We are going to stop his diltiazem CD1 120 mg.  Chronic diastolic heart failure - Stiffness of the heart noted previously on echocardiogram.  Continue to monitor.       Medication Adjustments/Labs and Tests Ordered: Current medicines are reviewed at length with the patient today.  Concerns regarding medicines are outlined above.  No orders of the defined types were placed in this encounter.  No orders of the defined types were placed in this encounter.   Patient Instructions  Medication Instructions:  Please discontinue your Diltiazem  120 mg. Continue all other  medications as listed.  *If you need a refill on your cardiac medications before your next appointment, please call your pharmacy*  Follow-Up: At [facility], you and your health needs are our priority.  As part of our continuing mission to provide you with exceptional heart care, we have created designated Provider Care Teams.  These Care Teams include your primary Cardiologist (physician) and Advanced Practice Providers (APPs -  Physician Assistants and Nurse Practitioners) who all work together to provide you with the care you need, when you need it.  We recommend signing up for the patient portal called "MyChart".  Sign up information is provided on this After Visit Summary.  MyChart is used to connect with patients for Virtual Visits (Telemedicine).  Patients are able to view lab/test results, encounter notes, upcoming appointments, etc.  Non-urgent messages can be sent to your provider as well.   To learn more about what you can do with MyChart, go to [email or web address].    Your next appointment:   6 month(s)  The format for your next appointment:   In Person  Provider:   Candee Furbish, MD   Thank you for choosing [facility]!!        Signed, Candee Furbish, MD  05/17/2021 9:59 AM    [place]  I have seen Derran Sear is a 75 y.o. male in the office today. The patient is felt to be a poor candidate for long-term anticoagulation because of AFIB.  Their CHADS-2-Vasc Score and unadjusted Ischemic Stroke Rate (% per year) is equal to 3.2 % stroke rate/year from a score of 3, necessitating a strategy of stroke prevention with either long-term oral anticoagulation or left atrial appendage occlusion therapy. We have discussed their bleeding risk in the context of their comorbid medical problems, as well as the rationale for referral for evaluation of Watchman left atrial appendage occlusion therapy. While the patient is at high long-term bleeding  risk, they may be appropriate for short-term anticoagulation. Based on this individual patient's stroke and bleeding risk, a shared decision has been made to refer the patient for consideration of Watchman left atrial appendage closure utilizing the [employer] of Cardiology shared decision tool.   Candee Furbish, MD

## 2021-05-17 NOTE — Patient Instructions (Signed)
Medication Instructions:  Please discontinue your Diltiazem 120 mg. Continue all other medications as listed.  *If you need a refill on your cardiac medications before your next appointment, please call your pharmacy*  Follow-Up: At [facility], you and your health needs are our priority.  As part of our continuing mission to provide you with exceptional heart care, we have created designated Provider Care Teams.  These Care Teams include your primary Cardiologist (physician) and Advanced Practice Providers (APPs -  Physician Assistants and Nurse Practitioners) who all work together to provide you with the care you need, when you need it.  We recommend signing up for the patient portal called "MyChart".  Sign up information is provided on this After Visit Summary.  MyChart is used to connect with patients for Virtual Visits (Telemedicine).  Patients are able to view lab/test results, encounter notes, upcoming appointments, etc.  Non-urgent messages can be sent to your provider as well.   To learn more about what you can do with MyChart, go to [email or web address].    Your next appointment:   6 month(s)  The format for your next appointment:   In Person  Provider:   Candee Furbish, MD   Thank you for choosing [facility]!!

## 2021-05-20 IMAGING — CT CT HEAD W/O CM
3 of 4 series · 14 of 47 positions shown, 16 images · non-contrast
Comparison: Head CT 03/27/2020.

CLINICAL DATA: Syncope with fall.

EXAM:
CT HEAD WITHOUT CONTRAST
CT CERVICAL SPINE WITHOUT CONTRAST
TECHNIQUE: Multidetector CT imaging of the head and cervical spine was
performed following the standard protocol without intravenous
contrast. Multiplanar CT image reconstructions of the cervical spine
were also generated.

[Series 3: head 2.0 h70h · axial · 0.43mm/px · z∈[+914,+1042]mm · 8 of 80 slices shown, 10 images]
[im 8/80  brain]
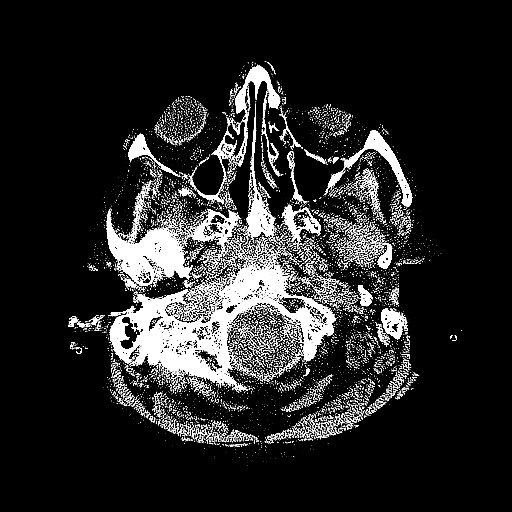
[im 8/80  bone]
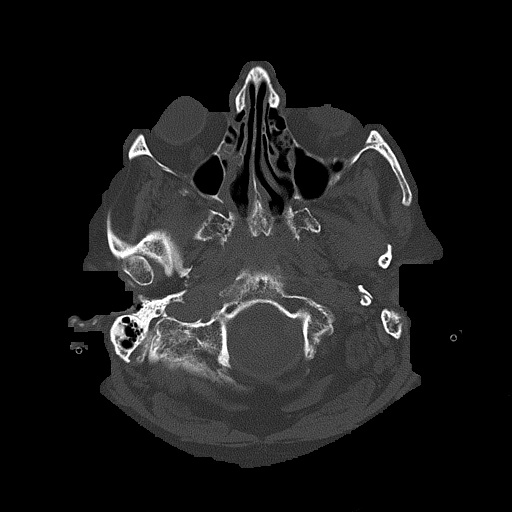
[im 16/80  brain]
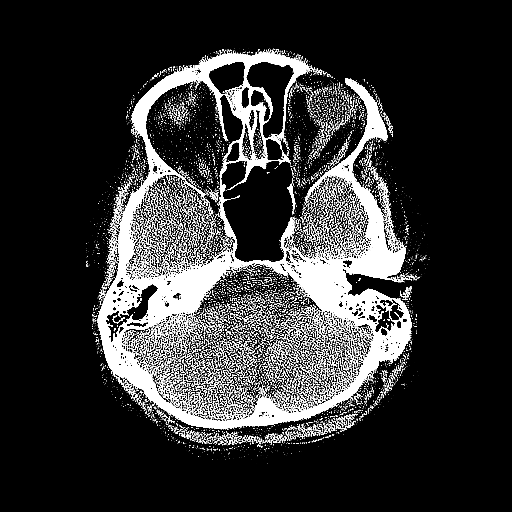
[im 24/80  brain]
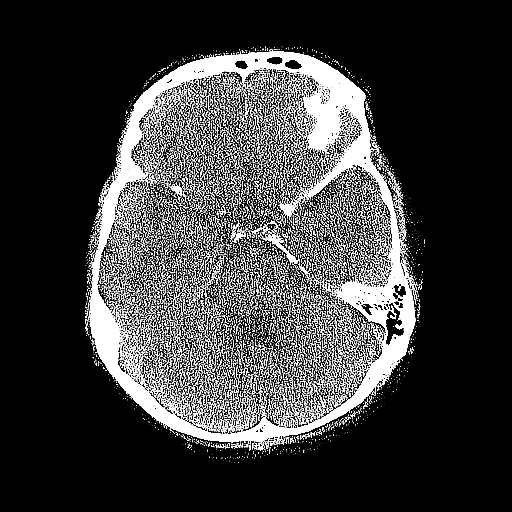
[im 36/80  brain]
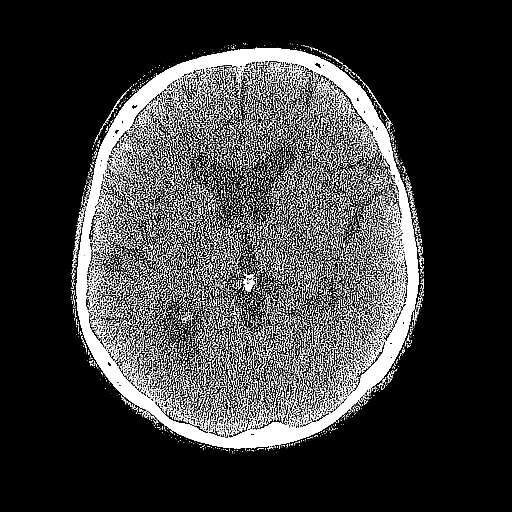
[im 44/80  brain]
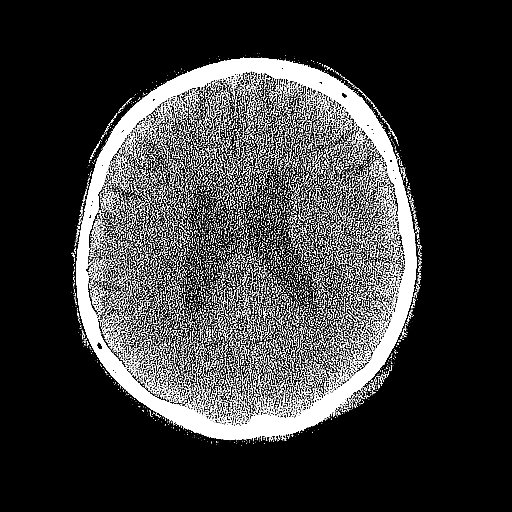
[im 44/80  bone]
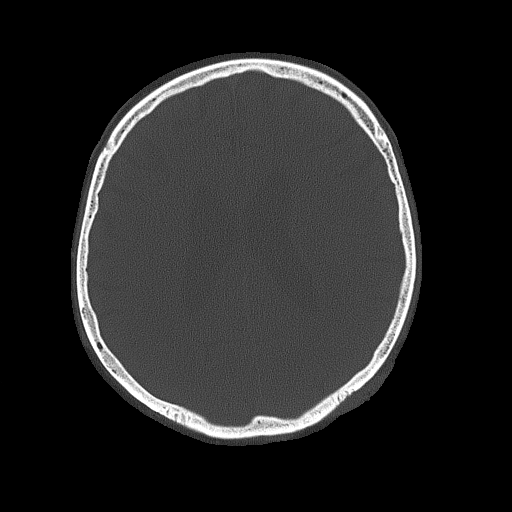
[im 56/80  brain]
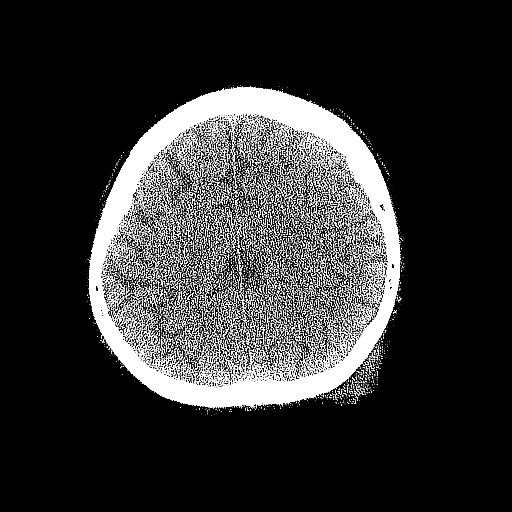
[im 64/80  brain]
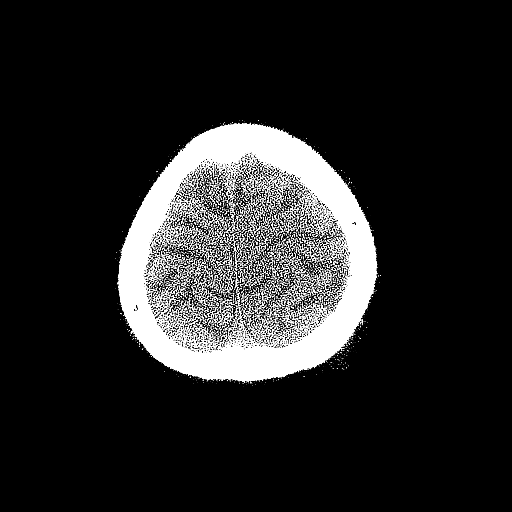
[im 72/80  brain]
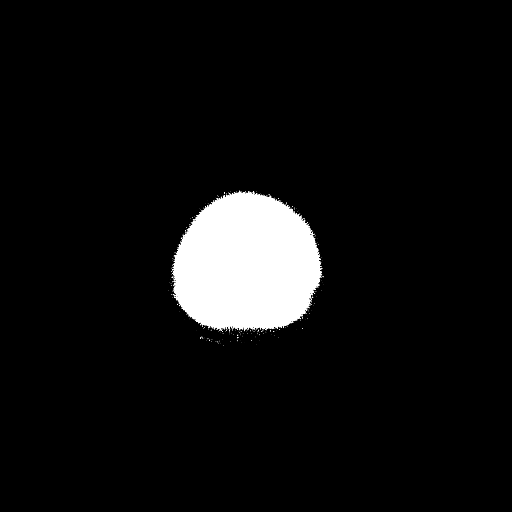

[Series 4: head 3.0 mpr cor · coronal · 0.32mm/px · 3 of 69 slices shown]
[im 23/69  brain]
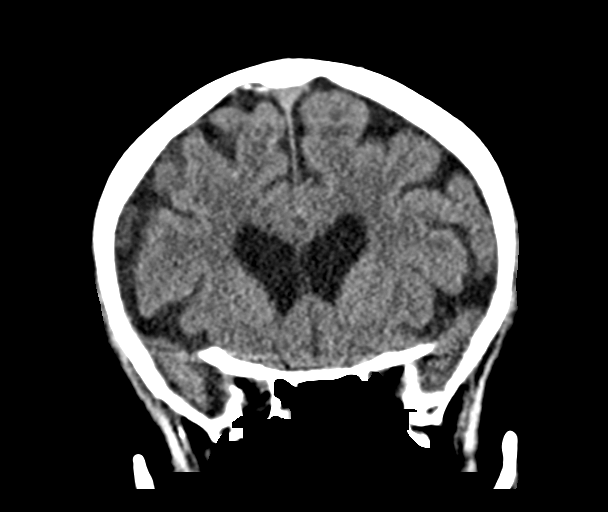
[im 31/69  brain]
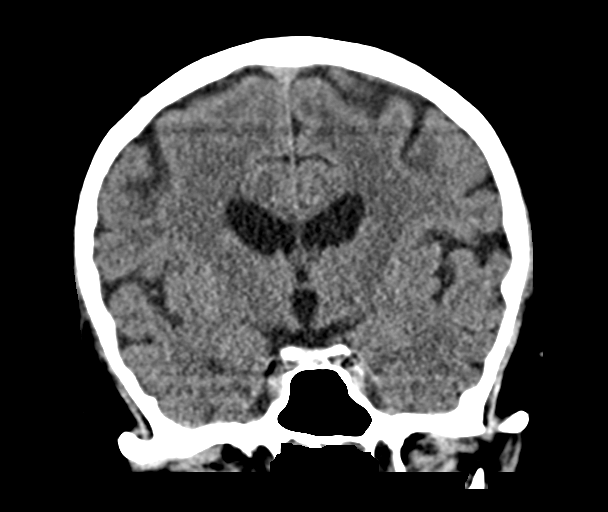
[im 38/69  brain]
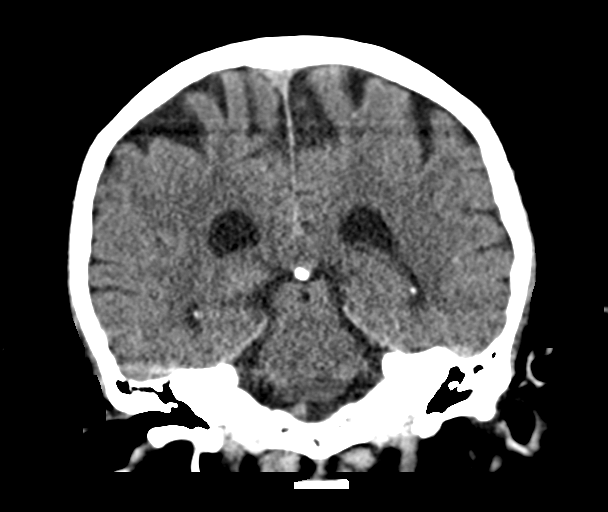

[Series 5: head 3.0 mpr sag · sagittal · 0.32mm/px · 3 of 64 slices shown]
[im 22/64  brain]
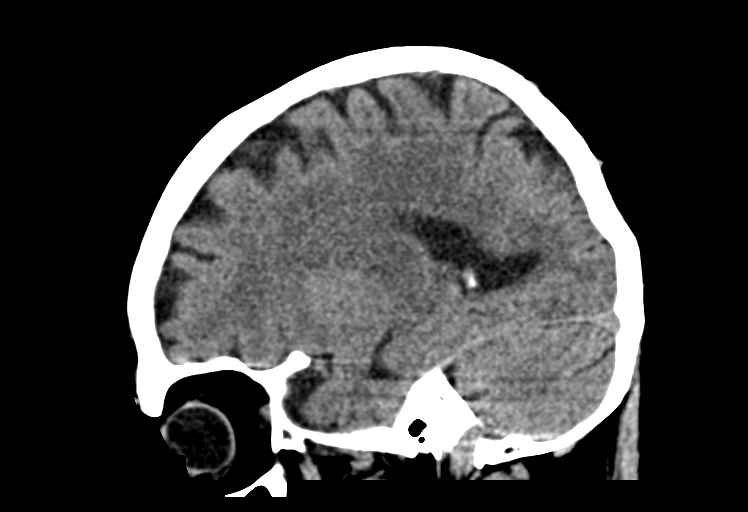
[im 32/64  brain]
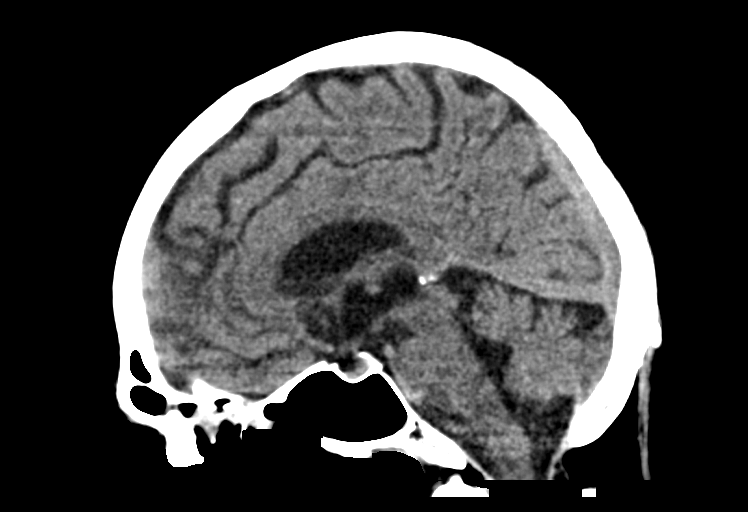
[im 43/64  brain]
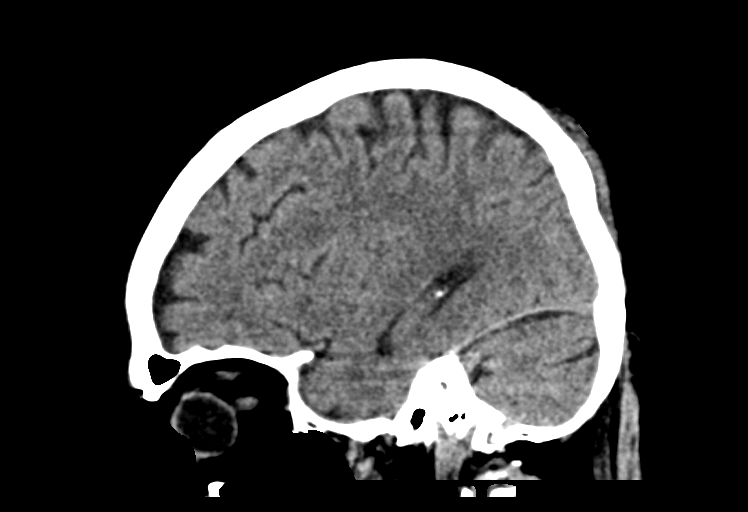

[14 of 47 positions shown; findings below may reference images not displayed]

FINDINGS: CT HEAD FINDINGS

Brain: There is no evidence for acute hemorrhage, hydrocephalus,
mass lesion, or abnormal extra-axial fluid collection. No definite
CT evidence for acute infarction. Diffuse loss of parenchymal volume
is consistent with atrophy. Patchy low attenuation in the deep
hemispheric and periventricular white matter is nonspecific, but
likely reflects chronic microvascular ischemic demyelination.

Vascular: No hyperdense vessel or unexpected calcification.

Skull: No evidence for fracture. No worrisome lytic or sclerotic
lesion.

Sinuses/Orbits: The visualized paranasal sinuses and mastoid air
cells are clear. Visualized portions of the globes and intraorbital
fat are unremarkable.

Other: Soft tissue contusion noted left parietal scalp.

CT CERVICAL SPINE FINDINGS

Alignment: Straightening of normal cervical lordosis. No
subluxation.

Skull base and vertebrae: No acute fracture. No primary bone lesion
or focal pathologic process.

Soft tissues and spinal canal: No prevertebral fluid or swelling. No
visible canal hematoma.

Disc levels: Loss of disc height noted C2-3 and C5-6. Diffuse facet
degeneration noted bilaterally.

Upper chest: Centrilobular emphysema with calcific pleuroparenchymal
scarring in both apices.

Other: None.
IMPRESSION: 1. No acute intracranial abnormality. Atrophy with chronic small
vessel white matter ischemic disease.
2. Soft tissue contusion left parietal scalp.
3. Degenerative changes in the cervical spine without fracture.
4. Loss of cervical lordosis. This can be related to patient
positioning, muscle spasm or soft tissue injury.

## 2021-05-20 IMAGING — CT CT CERVICAL SPINE W/O CM
3 of 4 series · 10 of 33 positions shown, 12 images · non-contrast
Comparison: Head CT 03/27/2020.

CLINICAL DATA: Syncope with fall.

EXAM:
CT HEAD WITHOUT CONTRAST
CT CERVICAL SPINE WITHOUT CONTRAST
TECHNIQUE: Multidetector CT imaging of the head and cervical spine was
performed following the standard protocol without intravenous
contrast. Multiplanar CT image reconstructions of the cervical spine
were also generated.

[Series 3: c_spine 2.0 i30s 3 · axial · 0.35mm/px · z∈[+826,+878]mm · 2 of 78 slices shown, 3 images]
[im 26/78  soft-tissue]
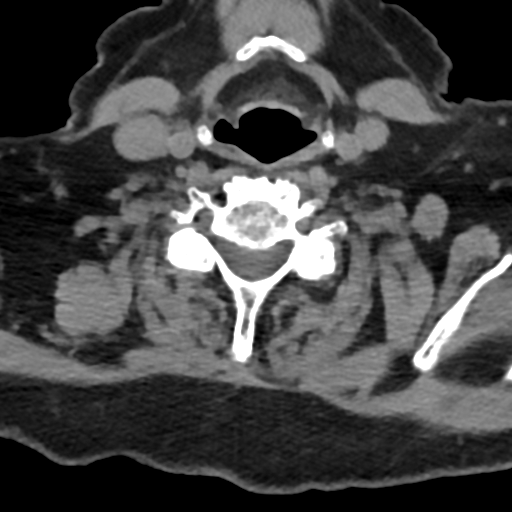
[im 26/78  bone]
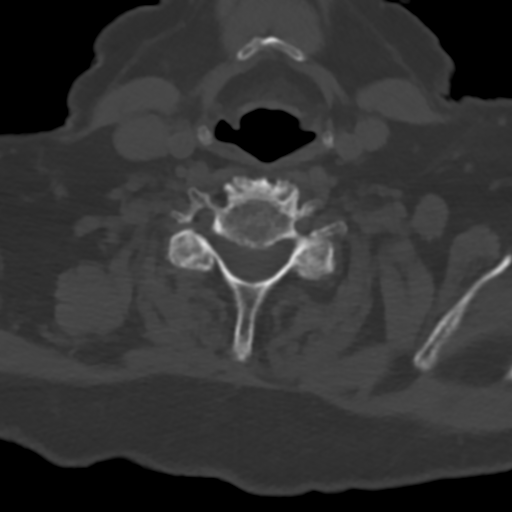
[im 52/78  bone]
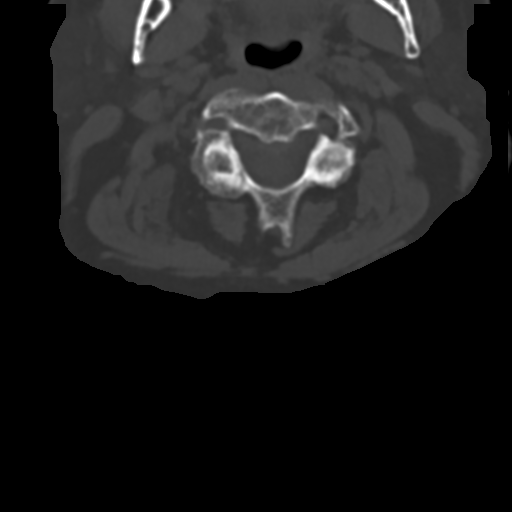

[Series 5: coronals · coronal · 0.27mm/px · 3 of 61 slices shown]
[im 16/61  bone]
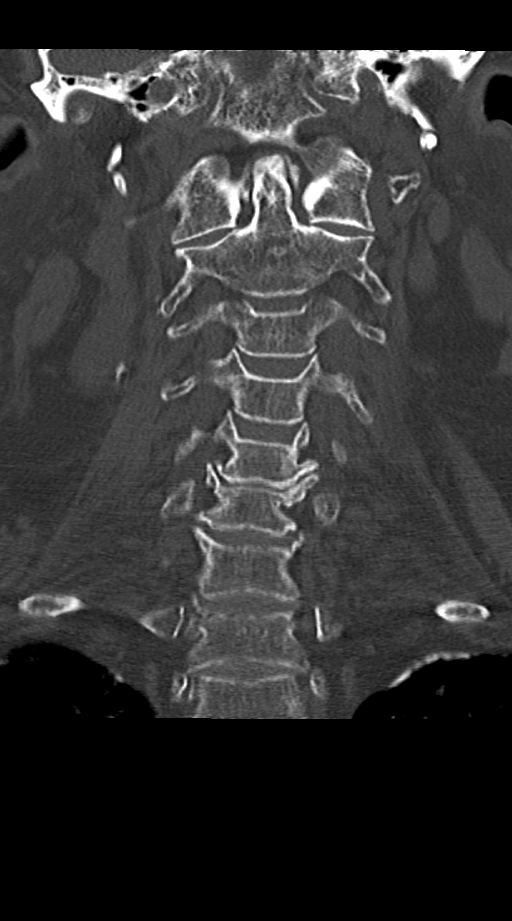
[im 26/61  bone]
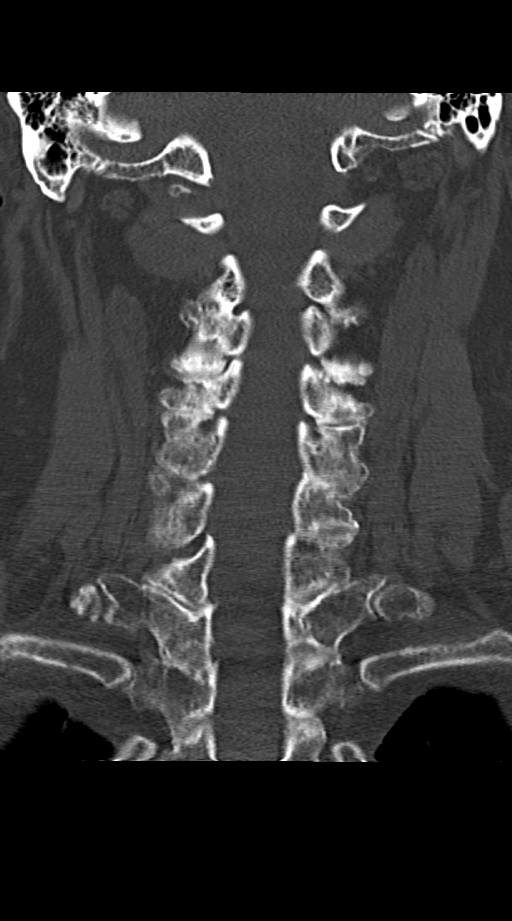
[im 35/61  bone]
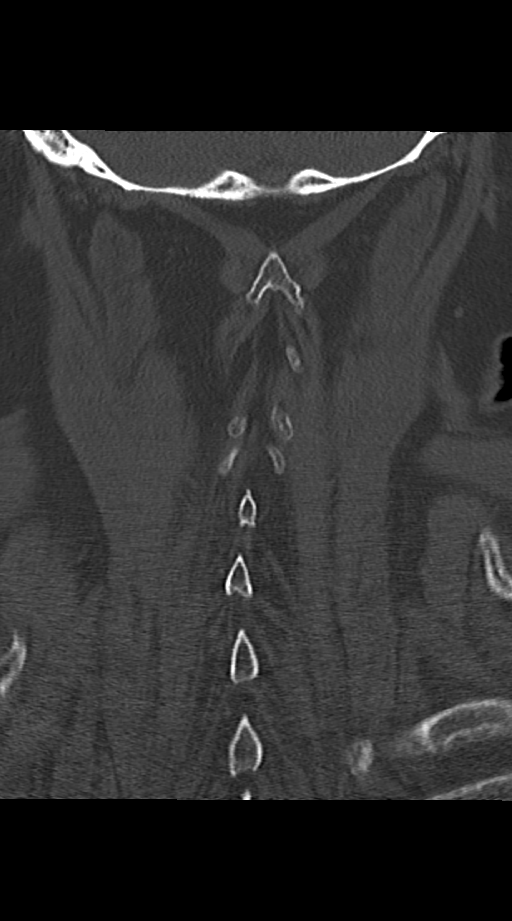

[Series 6: sagittals · sagittal · 0.28mm/px · 5 of 65 slices shown, 6 images]
[im 22/65  bone]
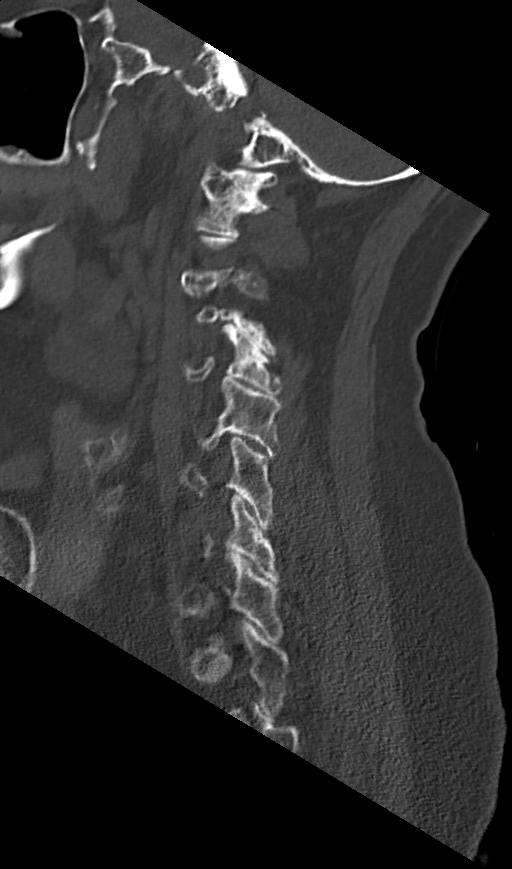
[im 27/65  bone]
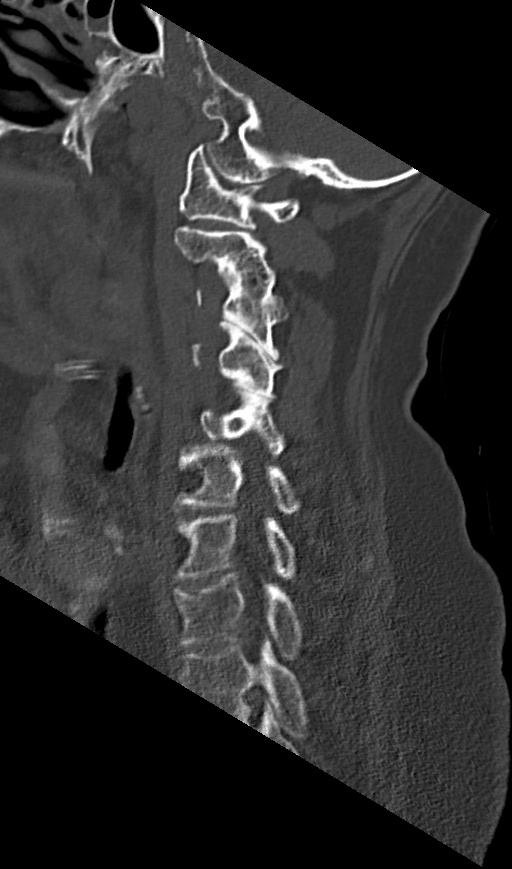
[im 33/65  soft-tissue]
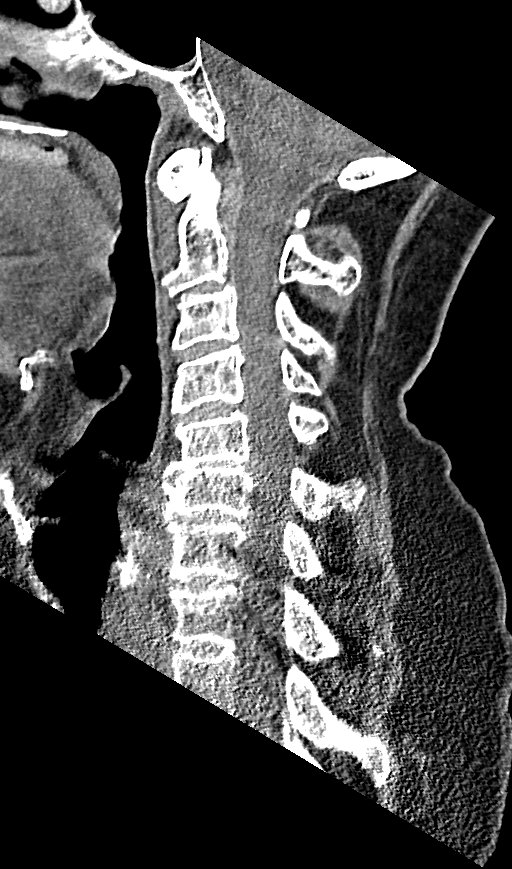
[im 33/65  bone]
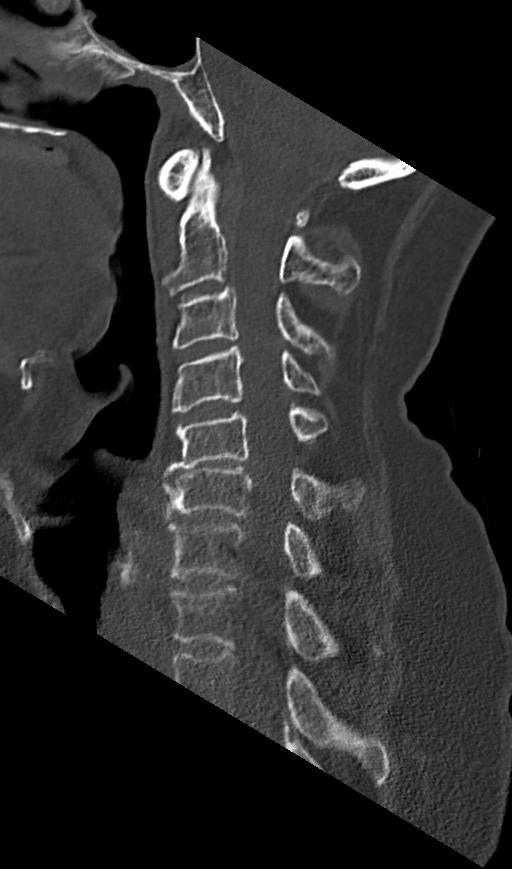
[im 38/65  bone]
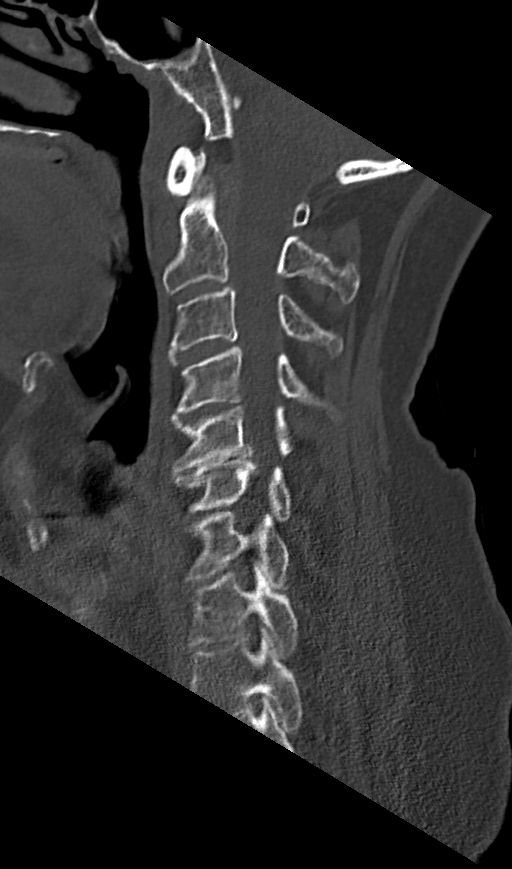
[im 43/65  bone]
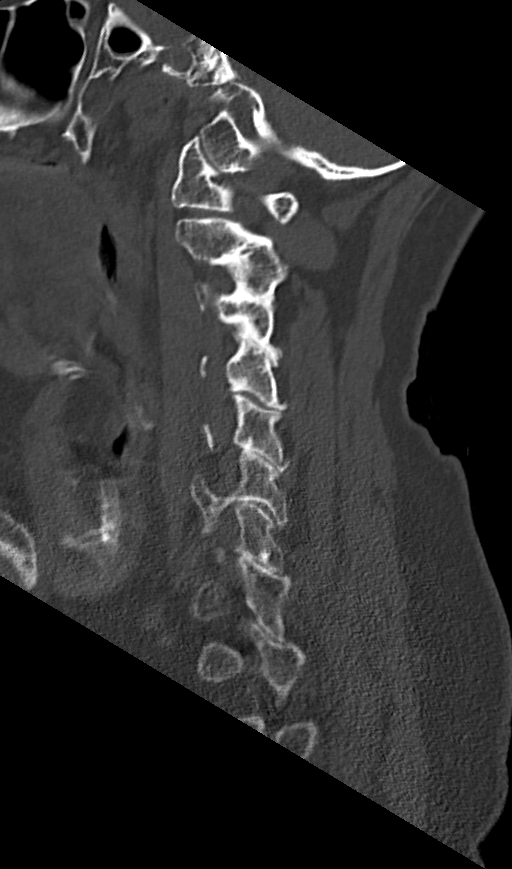

[10 of 33 positions shown; findings below may reference images not displayed]

FINDINGS: CT HEAD FINDINGS

Brain: There is no evidence for acute hemorrhage, hydrocephalus,
mass lesion, or abnormal extra-axial fluid collection. No definite
CT evidence for acute infarction. Diffuse loss of parenchymal volume
is consistent with atrophy. Patchy low attenuation in the deep
hemispheric and periventricular white matter is nonspecific, but
likely reflects chronic microvascular ischemic demyelination.

Vascular: No hyperdense vessel or unexpected calcification.

Skull: No evidence for fracture. No worrisome lytic or sclerotic
lesion.

Sinuses/Orbits: The visualized paranasal sinuses and mastoid air
cells are clear. Visualized portions of the globes and intraorbital
fat are unremarkable.

Other: Soft tissue contusion noted left parietal scalp.

CT CERVICAL SPINE FINDINGS

Alignment: Straightening of normal cervical lordosis. No
subluxation.

Skull base and vertebrae: No acute fracture. No primary bone lesion
or focal pathologic process.

Soft tissues and spinal canal: No prevertebral fluid or swelling. No
visible canal hematoma.

Disc levels: Loss of disc height noted C2-3 and C5-6. Diffuse facet
degeneration noted bilaterally.

Upper chest: Centrilobular emphysema with calcific pleuroparenchymal
scarring in both apices.

Other: None.
IMPRESSION: 1. No acute intracranial abnormality. Atrophy with chronic small
vessel white matter ischemic disease.
2. Soft tissue contusion left parietal scalp.
3. Degenerative changes in the cervical spine without fracture.
4. Loss of cervical lordosis. This can be related to patient
positioning, muscle spasm or soft tissue injury.

## 2021-05-22 DIAGNOSIS — E119 Type 2 diabetes mellitus without complications: Secondary | ICD-10-CM | POA: Diagnosis not present

## 2021-05-22 DIAGNOSIS — I480 Paroxysmal atrial fibrillation: Secondary | ICD-10-CM | POA: Diagnosis not present

## 2021-05-22 DIAGNOSIS — J449 Chronic obstructive pulmonary disease, unspecified: Secondary | ICD-10-CM | POA: Diagnosis not present

## 2021-05-22 DIAGNOSIS — I10 Essential (primary) hypertension: Secondary | ICD-10-CM | POA: Diagnosis not present

## 2021-05-28 DIAGNOSIS — R310 Gross hematuria: Secondary | ICD-10-CM | POA: Diagnosis not present

## 2021-05-28 DIAGNOSIS — K4020 Bilateral inguinal hernia, without obstruction or gangrene, not specified as recurrent: Secondary | ICD-10-CM | POA: Diagnosis not present

## 2021-05-28 DIAGNOSIS — K429 Umbilical hernia without obstruction or gangrene: Secondary | ICD-10-CM | POA: Diagnosis not present

## 2021-05-29 ENCOUNTER — Ambulatory Visit (INDEPENDENT_AMBULATORY_CARE_PROVIDER_SITE_OTHER): Payer: Medicare Other | Admitting: Internal Medicine

## 2021-05-29 ENCOUNTER — Other Ambulatory Visit: Payer: Self-pay

## 2021-05-29 ENCOUNTER — Other Ambulatory Visit: Payer: Self-pay | Admitting: Internal Medicine

## 2021-05-29 ENCOUNTER — Encounter: Payer: Self-pay | Admitting: Internal Medicine

## 2021-05-29 DIAGNOSIS — J9611 Chronic respiratory failure with hypoxia: Secondary | ICD-10-CM

## 2021-05-29 DIAGNOSIS — J449 Chronic obstructive pulmonary disease, unspecified: Secondary | ICD-10-CM

## 2021-05-29 MED ORDER — BREZTRI AEROSPHERE 160-9-4.8 MCG/ACT IN AERO: 2.0000 | INHALATION_SPRAY | Freq: Two times a day (BID) | RESPIRATORY_TRACT | 0 refills | Status: DC

## 2021-05-29 MED ORDER — PREDNISONE 10 MG PO TABS: ORAL_TABLET | ORAL | 1 refills | Status: DC

## 2021-05-29 NOTE — Assessment & Plan Note (Signed)
Hs 02 since around 2005 - referred for best fit 05/29/2021   Advised: Make sure you check your oxygen saturation  at your highest level of activity  to be sure it stays over 90% and adjust  02 flow upward to maintain this level if needed but remember to turn it back to previous settings when you stop (to conserve your supply).   F/u q 6 m, sooner prn           Each maintenance medication was reviewed in detail including emphasizing most importantly the difference between maintenance and prns and under what circumstances the prns are to be triggered using an action plan format where appropriate.  Total time for H and P, chart review, counseling, reviewing hfa/02  device(s) and generating customized AVS unique to this office visit / same day charting = 28 min

## 2021-05-29 NOTE — Progress Notes (Signed)
[name], male    DOB: 1946-05-15,    MRN: [id number]   Brief patient profile:  75 yowm MM/Quit smoking  02/03/09  with GOLD III spirometry baseline doe x able to golf. Only using 02 at  [facility] with  maint on symbicort/ spiriva and no chronic pred/ albuterol twice daily at most  much worse mid March 2020.   PFT 05/29/16: FVC 2.08 L (86%) FEV1 0.83 L (31%) FEV1/FVC 0.40 FEF 25-75 0.30 L (14%) no bronchodilator response TLC 6.56 L (107%) RV 167% ERV 160% DLCO corrected 60% (hemoglobin 10.9) 07/15/13: FVC 2.68 L (60%) FEV1 1.63 L (53%) FEV1/FVC 0.61 FEF 25-75 0.76 L (26%) 02/04/12: FVC 2.18 L (57%) FEV1 1.09 L (36%) FEV1/FVC 0.50 FEF 25-75 0.33 L (11%)   Admit date: 03/19/2019 Discharge date: 03/25/2019   Recommendations for Outpatient Follow-up:  1. F/u with PCP within a week  for hospital discharge follow up, repeat cbc/bmp at follow up 2. F/u with cardiology for new diagnosis of afib 3. Home 02  Discharge Diagnoses:      Active Hospital Problems   Diagnosis Date Noted  . PAF (paroxysmal atrial fibrillation) ([place])   . Acute on chronic respiratory failure with hypoxia ([place])   . COPD exacerbation ([place]) 03/19/2019         Filed Weights   03/23/19 1700 03/24/19 0503 03/25/19 0528  Weight: 60.2 kg 60 kg 62.3 kg    History of present illness: (per admitting MD Dr Earnest Conroy) PCP:Cox, Elnita Maxwell, MD Patient coming from:Home  Chief Complaint:Worsening shortness of breath  EYC:XKGY [name] a 75 y.o.malewith history h/oCOPD, hyperlipidemia whoat baseline was using 2 L O2 via nasal cannula only at night and follows [facility] (Dr. Vaughan Browner) as outpatient presents with worsening shortness of breath. Patient reports that at baseline he has cough with white phlegm and is able to walk around doing his ADLs without shortness of breath and uses O2 only at night. 2 weeks back he developed productive cough with green phlegm and shortness of breath but no fevers. He  underwent CT chest with contrast on March 18 which was negative for PE or infiltrates. He apparently was given doxycycline/prednisone course at that time which helped him transiently but symptoms worsened again past Monday with shortness of breath on minimum exertion/cough and he noticed his pulse ox was dropping to 74%.He started using 2 L O2 nasal cannula continuous. On Tuesday, he called pulmonary office given persistent symptoms and also pulse oximetry showing O2 sats84% on exertion and 93% at rest in spite of using continuous O2. He was advised by Dr. Vaughan Browner to use 60 mg prednisone x3 days followed by 10 mg taper every 3 days. He was also advised to use nebulizer treatments every 3 hours and continue Symbicort inhaler twice daily. Patient was supposed to start 50 mg prednisone today but woke up feeling extremely short of breath. He states he is also been having bad bouts of cough at night with wheezing and bronchospasm.  He presented to [facility] ED where he was noted to be afebrile but hypoxic, required BiPAP for 3 hours, chest x-ray was negative for infiltrates. Patient denies any recent history of travel or known sick contacts with flu positive or coronavirus positive patients. He lives at home with his wife who is recovering from lung cancer and completed chemotherapy 1 month back.  Hospitalist service was called to request direct admission to medical floor but ED to ED transfer facilitated for thorough clinical evaluation prior  to requesting medical bed given coronavirus pandemic. In the ED at [facility], patient has been saturating well on 3 L nasal cannula but appears tachypneic on talking full sentences. He denies any chest pain, denies any fevers or leg swellings.  Hospital Course:  Active Problems:   COPD exacerbation (HCC)   PAF (paroxysmal atrial fibrillation) (HCC)   Acute on chronic respiratory failure with hypoxia (HCC)   Acute onHypoxic respiratory failure,  COPDexacerbation,Progression of severe COPD -Emphysema, with no evidence of superimposed acute cardiopulmonary disease -improving on iv steroids, oral zitrhomax, he finished total of 5 days of zithromax in the hospital, he is discharged on slow prednisone taper.  - o2 dropped to 86% while ambulating on room air, discharged on continuous home o2 ( previously on nighttime o2 only) -he is to follow with Dr Vaughan Browner    Paroxsymalafib, New diagnosis of afib -he is started on eliquis (CHADS-VASC 2, age and aortic atherosclerosis), cardizem, lasix -cardiology consulted recommend "Diltiazem CD 360. Avoid amiodarone and beta blockers given his underlying lung dz." -outpatient echo per cardiology   Aortic and multivessel coronary atherosclerosis - Changed to high intensity statin, atorvastatin 40mg  PO QD. Check lipids and ALT as outpatient in 3 months.  - ASA 81. Aggressive secondary prevention. - No chest pain.      History of Present Illness  03/29/2019  Pulmonary/ extended post hosp f/u eval/Lindy Garczynski  Re GOLD III copd ? 02 dep now Chief Complaint  Patient presents with  . Hospitalization Follow-up    Breathing has improved some. He has not used his albuterol inhaler or neb since hospital d/c.  Dyspnea:  mb and back ok off 02 sats upper 80s at end  Cough: none Sleep: recliner x 45 degrees = baseline 02 2lpm hs  SABA use: none  Since d/c on symb 160 2bid and spiriva each pm (technique 50% on both, see a/p) On pred 50 mg daily on day as ov rec Plan A = Automatic = symbicort 160 Take 2 puffs first thing in am and then another 2 puffs about 12 hours later.  Spiriva x 2 puffs  right after symbicort  Work on inhaler technique: Plan B = Backup Only use your albuterol inhaler(Proventil/Proair)  as a rescue medication Plan C = Crisis - only use your albuterol nebulizer if you first try Plan B and it fails to help > ok to use the nebulizer up to every 4 hours but if start needing it  regularly call for immediate appointment Plan D = Doctor - call me if B and C not adequate Plan E = ER - go to ER or call 911 if all else fails   02  2lpm at bedtime and adjust to keep it over 90% with activity       06/15/2019  f/u ov/Nalea Salce re:  COPD III  maint symb/spiriva on prednisone still 15 mg daily  Chief Complaint  Patient presents with  . Follow-up    Breathing has improved  Dyspnea:  10 min x 3 x daily  Due to back / no hills sats usually lower 90s RA Cough: no  Sleeping: 10 degrees SABA use: none 02: 2lpm hs  rec Prednisone 10 mg with bfast x one week then 1/2 x one week then 1/2 on even days x 2 weeks and stop At any point get worse, increase back to 20 mg daily and start over.   07/26/2019  f/u ov/Esly Selvage re: copd III/ maint pred 10 mg daily  -tends to flare  on lower doses.  Despite maintenance with Symbicort and Spiriva. Chief Complaint  Patient presents with  . Follow-up    Had increased SOB when weened off pred so started back on 10 mg daily and this has helped. He is using his proair 4 x per wk on average and uses neb once per day.  Dyspnea:  10 -12 min outside and inside slowed both by back and breathing  Cough: better / min mucoid sputum production even in am Sleeping: flat  SABA use: way too much  - does not rechallenge  02: 02 2lpm hs  rec Prednisone 10 mg ceiling and 5 mg (one half) floor for now  Plan A  = symbicort, spiriva and prednisone as above Plan B Only use your albuterol as a rescue medication Plan C  Use the nebulizer up to every 4 hours if Plan B doesn't work      10/26/2019  f/u ov/Manette Doto re: GOLD III/  Steroid dep since early March 2020 on symb/spiriva and pred 20 mg daily  Chief Complaint  Patient presents with  . Follow-up    Breathing is doing well. He uses his proair once per wk on average. He has not used neb recently.   Dyspnea: walking 1.5 - 3 miles a day  Cough: none  Sleeping: hob up 6 inches mattress wedge  SABA use: as above,  rarely needed 02: 2lpm hs/ sats running in high 80's with activity  rec Plan A = Automatic = Always=    Trelegy one click each am (spiriva/ symbicort)  Plan B = Backup (to supplement plan A, not to replace it) Only use your albuterol inhaler as a rescue medication Plan C = Crisis (instead of Plan B but only if Plan B stops working) - only use your albuterol nebulizer if you first try Plan B  Try prednisone 15 mg daily if possible - increase to 20 mg daily if not doing as well    Televisit  12/27/19 recs Plan A = Automatic = Always=    Trelegy each am  Plan B = Backup (to supplement plan A, not to replace it) Only use your albuterol inhaler as a rescue medication Plan C = Crisis (instead of Plan B but only if Plan B stops working) - only use your albuterol nebulizer if you first try Plan B Plan D = Deltasone  - 2 prednisone = 20 mg / day until better then drop to 15 mg daily as tolerated  Make sure you check your oxygen saturations at highest level of activity to be sure it stays over 90% and adjust upward to maintain this level if needed but remember to turn it back to previous settings when you stop (to conserve your supply).  Late add:  Prilosec 20 mg Take 30- 60 min before your first and last meals of the day until returns and continue off the fosfamax (not taking it anyway)   GERD diet zpak    Admit date: 01/04/2020 Discharge date: 01/11/2020  Admitted From: home Disposition: home  Recommendations for Outpatient Follow-up:  1. Follow up with PCP in 1-2 weeks 2. Keep appointment with Dr. Joya Gaskins as scheduled 3. Please obtain BMP/CBC in one week 4. Please follow up on the following pending results: consider starting prolia  Home Health:no Equipment/Devices:home oxygen, not new     Brief/Interim Summary: Per H &P:Kalib [name] a 75 y.o.malewith medical history significant ofA. fib, COPD, chronic respiratory failure on 2 L home oxygen only at nighttime,  hyperlipidemia  presented with acute exacerbation of chronic obstructive pulmonary disease. Due to the severity of his underlying lung disease with chronic prednisone usage slowly weaned down to TID nebulizers scheduled and to oral prednisone. Had some Pt/OT which caused desaturations throughout hospital stay. Due to increased coughing in the hospital he had new upper back pain. With findings of midline tenderness X-ray ordered with old compression fractures and new compression fracture in t spine. Started calcitonin nasal spray and urged him to consider prolia. He states he tried fosamax and could not tolerate. Overall he continued to improve throughout the hospital course and was discharged home on extended prednisone taper back to home 20 mg daily. Asked to schedule nebulizer treatments TID at home for about 1 week then down to BID if able to tolerate.    Discharge Diagnoses:  Principal Problem:   COPD with acute exacerbation [facility]):   Chronic respiratory failure with hypoxia (HCC)   Paroxysmal atrial fibrillation (HCC)   HLD (hyperlipidemia)   SOB (shortness of breath)   01/27/2020  f/u ov/Jaziel Bennett re:  Copd with persistent flare since March 2020 remains high dose steroid dep  Chief Complaint  Patient presents with  . Follow-up    Breathing is about the same. He rarely uses his albuterol inhaler. He used neb last 1 wk ago. He has occ cough with green sputum. Currently on 40 mg pred.   Dyspnea:  Across the room  Cough: some before bed / uses flutter light green only using one 600 mg mucinex qam only  but does use flutter Sleeping: wedge blocks SABA use: minimal  02: 2lpm hs / says usually above 90% walking RA rec Stop lopressor and start Bisoprolol 5 mg daily in its place - if blood pressure is too high take twice daily  For cough / congestion > mucinex up to 1200 mg twice daily and use flutter Plan A = Automatic = Always=   Use albuterol neb 1st thing in am and wait 15 minutes and then use Trelegy  Plan B =  Backup (to supplement plan A, not to replace it) Only use your albuterol inhaler  Plan C = Crisis (instead of Plan B but only if Plan B stops working) - only use your albuterol nebulizer if you first try Plan B and it fails to help > ok to use the nebulizer up to every 4 hours but if start needing it regularly call for immediate appointment Prednisone 20 mg daily  - ok to double if doing worse Please schedule a follow up office visit in 4 weeks, sooner if needed  with all medications   Admission date:  02/16/2020   Discharge Date:  02/20/2020   COPD with acute exacerbation (HCC)  Paroxysmal atrial fibrillation ([place])  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[place])  Essential hypertension  Acute respiratory failure with hypoxia ([place])     02/24/2020   Extended post hosp ov/Damar Petit re: copd  On prednisone 30 mg daily to taper to 20 p 3 more days Dyspnea:  Just walking in house for now but much better Cough: better  Sleeping: wedge under mattress maybe 30 degrees hob ok SABA use: none now  02: sleep on 2lpm/ sats in 90's s supplement rec Make sure you check your oxygen saturations at highest level of activity to be sure it stays over 90% and adjust upward to maintain this level if needed but remember to turn it back to previous settings when you stop (to conserve your  supply).  Try albuterol(Proair) 15 min before an activity that you know would make you short of breath and see if it makes any difference and if makes none then don't take it after activity unless you can't catch your breath. Reduce prednsione to 20 mg daily = 2 x 10 as planned  - if you are doing great ok to try 1.5 = 15 mg daily If continue to flare then ok to add daliresp 250 mg one daily  For cough / congestion as needed >>  mucinex up 1200mg  every 12 hours but use the flutter valve as much as you can  Please schedule a follow up office visit in 6 weeks, call sooner if needed with all medications /inhalers/ solutions in hand  so we can verify exactly what you are taking.     10/03/2020  f/u ov/Yoana Staib re: could not tolerate daliresp/ on pred 10 mg daily  Chief Complaint  Patient presents with  . Follow-up    Denies any problems with breathing  Dyspnea:  Treadmill  6/7 days,  30 min s stopping,  2.8 mph 3 degrees at the end does not check sats at peak ex Cough: none  Sleeping: wedge 30 degrees on side  SABA OEU:MPNTI on neb twice daily  02: 2lpm hs /  Not using at all during day  rec  Make sure you check your oxygen saturations at highest level of activity to be sure it stays over 90% and adjust  02 flow upward to maintain this level if needed but remember to turn it back to previous settings when you stop (to conserve your supply).  Plan A = Automatic = Always=   symbicort and spiriva  First thing in am and then symbicort 12 hours later Prednisone 10 mg daily  Plan B = Backup (to supplement plan A, not to replace it) Only use your albuterol (Proventil) inhaler as a rescue medication Plan C = Crisis (instead of Plan B but only if Plan B stops working) - only use your albuterol nebulizer if you first try Plan B  request the moderna booster this month Please schedule a follow up visit in 3 months but call sooner if needed    02/19/2021  f/u ov/Doristine Shehan re: prednisone 27. 5 mg daily on way to 10 mg goal  Chief Complaint  Patient presents with  . Follow-up    SOB at times   Dyspnea:  Participating in rehab day #4/36 Cough: none Sleeping:  wedge under mattress 30 degress SABA use: twice daily  02: 2lpm sleeping, 2 lpm sitting and 4 lpm with ex  Covid status:   Max vax and hand covid  rec Breztri  = symbicort and spiriva  Plan A = Automatic = Always=    Breztri Take 2 puffs first thing in am and then another 2 puffs about 12 hours later.  Work on inhaler technique: Plan B = Backup (to supplement plan A, not to replace it) Only use your albuterol inhaler as a rescue medication Plan C = Crisis (instead of Plan B  but only if Plan B stops working) - only use your albuterol nebulizer if you first try Plan B  Prednisone 10 mg work it down to 1 pill daily  Make sure you check your oxygen saturation  at your highest level of activity    05/29/2021  f/u ov/Tilford Deaton re:  GOLD III  breztri  And prednisone at 10 mg  X weeks s flare  Chief Complaint  Patient  presents with  . Follow-up    Breathing is doing well overall. He uses his albuterol inhaler 4-5 x per wk. He rarely uses neb.    Dyspnea:  Walking 2.8 mph ,mostly flat  x 30 min s stopping / 02 sats on 2lpm but will go  Cough: none  Sleeping: wedge 30 degrees SABA use: as above/ mostly hfa  02: 2lpm hs/ none needed at rest  Covid status:   X  vax 3    No obvious day to day or daytime variability or assoc excess/ purulent sputum or mucus plugs or hemoptysis or cp or chest tightness, subjective wheeze or overt sinus or hb symptoms.   Sleeping as above without nocturnal  or early am exacerbation  of respiratory  c/o's or need for noct saba. Also denies any obvious fluctuation of symptoms with weather or environmental changes or other aggravating or alleviating factors except as outlined above   No unusual exposure hx or h/o childhood pna/ asthma or knowledge of premature birth.  Current Allergies, Complete Past Medical History, Past Surgical History, Family History, and Social History were reviewed in [employer] record.  ROS  The following are not active complaints unless bolded Hoarseness, sore throat, dysphagia, dental problems, itching, sneezing,  nasal congestion or discharge of excess mucus or purulent secretions, ear ache,   fever, chills, sweats, unintended wt loss or wt gain, classically pleuritic or exertional cp,  orthopnea pnd or arm/hand swelling  or leg swelling, presyncope, palpitations, abdominal pain, anorexia, nausea, vomiting, diarrhea  or change in bowel habits or change in bladder habits, change in stools or change  in urine, dysuria, hematuria,  rash, arthralgias, visual complaints, headache, numbness, weakness or ataxia or problems with walking or coordination,  change in mood or  memory.        Current Meds  Medication Sig  . albuterol (PROAIR HFA) 108 (90 Base) MCG/ACT inhaler Inhale 2 puffs into the lungs every 6 (six) hours as needed for wheezing or shortness of breath.   Marland Kitchen albuterol (PROVENTIL) (2.5 MG/3ML) 0.083% nebulizer solution Take 3 mLs (2.5 mg total) by nebulization in the morning and at bedtime.  . ALPRAZolam (XANAX) 0.25 MG tablet Take 1 tablet (0.25 mg total) by mouth 2 (two) times daily as needed for anxiety.  Marland Kitchen apixaban (ELIQUIS) 5 MG TABS tablet Take 1 tablet (5 mg total) by mouth 2 (two) times daily.  Marland Kitchen ascorbic acid (VITAMIN C) 500 MG tablet Take 1 tablet (500 mg total) by mouth daily.  Marland Kitchen atorvastatin (LIPITOR) 40 MG tablet TAKE 1 TABLET(40 MG) BY MOUTH DAILY AFTER SUPPER  . bisoprolol (ZEBETA) 5 MG tablet Take 1 tablet (5 mg total) by mouth daily.  . Budeson-Glycopyrrol-Formoterol (BREZTRI AEROSPHERE) 160-9-4.8 MCG/ACT AERO Inhale 2 puffs into the lungs 2 (two) times daily.  . calcium carbonate (OS-CAL) 600 MG TABS tablet Take 600 mg by mouth daily.  . Cholecalciferol (VITAMIN D) 50 MCG (2000 UT) CAPS Take 2,000 Units by mouth daily.  Marland Kitchen denosumab (PROLIA) 60 MG/ML SOSY injection Inject 60 mg into the skin every 6 (six) months.  . diltiazem (CARDIZEM) 60 MG tablet Take 1 tablet (60 mg total) by mouth 4 (four) times daily as needed.  . ferrous sulfate 325 (65 FE) MG tablet Take 325 mg by mouth daily with breakfast.  . furosemide (LASIX) 40 MG tablet Take 1 tablet (40 mg total) by mouth daily.  Marland Kitchen guaiFENesin (MUCINEX) 600 MG 12 hr tablet Take 1,200 mg by mouth  2 (two) times daily.  Marland Kitchen loratadine (CLARITIN) 10 MG tablet Take 10 mg by mouth in the morning.  . Multiple Vitamin (MULTIVITAMIN WITH MINERALS) TABS tablet Take 1 tablet by mouth daily.  . Niacinamide-Zn-Cu-Methfo-Se-Cr  (NICOTINAMIDE PO) Take 1 tablet by mouth in the morning and at bedtime.  Marland Kitchen omeprazole (PRILOSEC OTC) 20 MG tablet Take 20 mg by mouth 2 (two) times daily before a meal.  . OXYGEN Inhale 2-2.5 L/min into the lungs See admin instructions. Inhale 2 L/min of oxygen into the lungs at bedtime and when ambulatory  . predniSONE (DELTASONE) 10 MG tablet Taper to 10 mg one daily  . sertraline (ZOLOFT) 50 MG tablet Take 1 tablet (50 mg total) by mouth daily.  . sodium chloride (OCEAN) 0.65 % nasal spray Place 1 spray into the nose as needed for congestion.                    Objective:      05/29/2021          155 02/19/2021        151  10/03/2020      155  02/24/2020         137  01/27/2020         144 10/26/2019        156  07/26/2019          144   06/15/19 136 lb (61.7 kg)  05/29/19 134 lb 7.7 oz (61 kg)  05/28/19 134 lb 3.2 oz (60.9 kg)    Vital signs reviewed  05/29/2021  - Note at rest 02 sats  97% on RA   General appearance:    Pleasant amb wf/ centripetal obesity    Reports  Full dentures       HEENT : pt wearing mask not removed for exam due to covid -19 concerns.    NECK :  without JVD/Nodes/TM/ nl carotid upstrokes bilaterally   LUNGS: no acc muscle use,  Mod barrel  contour chest wall with bilateral  Distant bs s audible wheeze and  without cough on insp or exp maneuvers and mod  Hyperresonant  to  percussion bilaterally     CV:  RRR  no s3 or murmur or increase in P2, and no edema   ABD:  soft and nontender with pos mid insp Hoover's  in the supine position. No bruits or organomegaly appreciated, bowel sounds nl  MS:     ext warm without deformities, calf tenderness, cyanosis or clubbing No obvious joint restrictions   SKIN: warm and dry without lesions    NEURO:  alert, approp, nl sensorium with  no motor or cerebellar deficits apparent.           Assessment

## 2021-05-29 NOTE — Assessment & Plan Note (Signed)
Quit smoking 01/2009  02/04/12 [name]: FEV1 36% FVC 57% FEF 25 75    11% -05/29/16  FEV1 0.83 L (31%) FEV1/FVC 0.40 FEF 25-75 0.30 L (14%) no bronchodilator response p ? rx prior, DLCO corrected 60% (hemoglobin 10.9) - 03/29/2019   continue symb 160 2bid and spiriva smi 2 puffs each am  - 04/15/2019 placed on daily pred with ceiling 40 and floor of 10 mg daily and empirical rx for GERD - 04/15/2019 flutter valve training  - alpha one screen 06/15/2019  MM  Level 178 - 06/15/2019  After extensive coaching inhaler device,  effectiveness =    90% > so try to taper pred off using ceiling of 20 if worse > 07/26/2019 changed to ceiling of 10 and floor of 5 mg  - 10/26/2019  After extensive coaching inhaler device,  effectiveness =    90% with elipta and still needing pred 20 mg daily so try trelegy sample > consider breztri if prefers hfa and insurance covers  - 12/27/2019 added ppi bid ac otc for atypical spells of sob  - 01/27/2020 changed lopressor to bisoprolol due to refractory wheeze/ sob on lopressor 100 mg daily - Sinus CT  02/03/20 1. Minimal paranasal sinus mucosal thickening, limited to a left ethmoid air cell. No convincing acute sinusitis. 2. Symmetric but mild nasal cavity mucosal thickening, raising the possibility of mild rhinitis. CT chest 02/03/20 1. Coronary artery calcifications are noted suggesting coronary artery disease. 2. Emphysematous disease is noted in the upper lobes bilaterally. 3. Interval development of several old thoracic compression fractures since prior exam. No definite acute fracture is noted.  - 02/24/20 trial of daliresp 250 > could not tol   - 02/19/2021  After extensive coaching inhaler device,  effectiveness =   80% > try change to breztri    Group D in terms of symptom/risk and laba/lama/ICS  therefore appropriate rx at this point >>>  Continue breztri and try pred 10/5 daily using the "lowest effective dose strategy" and using saba need as indicator

## 2021-05-29 NOTE — Addendum Note (Signed)
Addended by: Rosana Berger on: 05/29/2021 10:33 AM   Modules accepted: Orders

## 2021-05-29 NOTE — Patient Instructions (Addendum)
Try prednisone 10 mg alternating with 5 mg days  No change in any of your other medication   We can refer you now for a best fit for portable 02 concentrator   Please schedule a follow up visit in 6  months but call sooner if needed

## 2021-06-04 ENCOUNTER — Telehealth: Payer: Self-pay

## 2021-06-04 NOTE — Progress Notes (Signed)
Chronic Care Management Pharmacy Assistant   Name: Paul [name]  MRN: [id number] DOB: February 17, 1946  Reason for Encounter: Disease State for CHF  Recent office visits:  05/29/21-Pulmonology-COPD,  Due to increased coughing in the hospital he had new upper back pain. With findings of midline tenderness X-ray ordered with old compression fractures and new compression fracture in t spine. Started calcitonin nasal spray and urged him to consider prolia. He states he tried fosamax and could not tolerate. Overall he continued to improve throughout the hospital course and was discharged home on extended prednisone taper back to home 20 mg daily. Asked to schedule nebulizer treatments TID at home for about 1 week then down to BID if able to tolerate.    04/27/21-Cardiology, Afib,  Diltiazem previously decreased to 120 mg a day.  As short acting 60 mg to take as needed.  He is on low-dose bisoprolol 5 mg a day as well.  Because he is still having some orthostatic type symptoms, i.e. he has to sit then stand for a few seconds to calibrate before moving because he can sometimes get very dizzy, I will stop his diltiazem CD1 120 mg a day.  We will continue with bisoprolol 5 mg a day.  He showed me a list of his blood pressures.  Some of them have diastolics in the 55D but systolics mostly are ranging between 101 130.  05/15/21-Wound care, He reports hitting his right leg with his heel developed a wound on that side. Additionally, he developed a wound on his left leg. He was on vacation when he had these happen. He saw wound care clinic down [facility]. He was placed indressings. He is here for follow-up. No fevers chills nausea or vomiting.  05/11/21-Patent wants referral for Watchman device  05/10/21-Dr Cox PCP, Hematuria, Labs, HOLD ELIQUIS REFER TO UROLOGY ([facility]) Wbc elevated.  Hb improved. Blood in urine has not caused hb to drop. Cmp normal.   Recent consult visits:  none  Hospital visits:  03/21/21-ED  for Syncope  Medications: Outpatient Encounter Medications as of 06/04/2021  Medication Sig   albuterol (PROAIR HFA) 108 (90 Base) MCG/ACT inhaler Inhale 2 puffs into the lungs every 6 (six) hours as needed for wheezing or shortness of breath.    albuterol (PROVENTIL) (2.5 MG/3ML) 0.083% nebulizer solution Take 3 mLs (2.5 mg total) by nebulization in the morning and at bedtime.   ALPRAZolam (XANAX) 0.25 MG tablet Take 1 tablet (0.25 mg total) by mouth 2 (two) times daily as needed for anxiety.   apixaban (ELIQUIS) 5 MG TABS tablet Take 1 tablet (5 mg total) by mouth 2 (two) times daily.   ascorbic acid (VITAMIN C) 500 MG tablet Take 1 tablet (500 mg total) by mouth daily.   atorvastatin (LIPITOR) 40 MG tablet TAKE 1 TABLET(40 MG) BY MOUTH DAILY AFTER SUPPER   bisoprolol (ZEBETA) 5 MG tablet Take 1 tablet (5 mg total) by mouth daily.   Budeson-Glycopyrrol-Formoterol (BREZTRI AEROSPHERE) 160-9-4.8 MCG/ACT AERO Inhale 2 puffs into the lungs 2 (two) times daily.   Budeson-Glycopyrrol-Formoterol (BREZTRI AEROSPHERE) 160-9-4.8 MCG/ACT AERO Inhale 2 puffs into the lungs in the morning and at bedtime.   calcium carbonate (OS-CAL) 600 MG TABS tablet Take 600 mg by mouth daily.   Cholecalciferol (VITAMIN D) 50 MCG (2000 UT) CAPS Take 2,000 Units by mouth daily.   denosumab (PROLIA) 60 MG/ML SOSY injection Inject 60 mg into the skin every 6 (six) months.   diltiazem (CARDIZEM) 60 MG tablet Take 1  tablet (60 mg total) by mouth 4 (four) times daily as needed.   ferrous sulfate 325 (65 FE) MG tablet Take 325 mg by mouth daily with breakfast.   furosemide (LASIX) 40 MG tablet Take 1 tablet (40 mg total) by mouth daily.   guaiFENesin (MUCINEX) 600 MG 12 hr tablet Take 1,200 mg by mouth 2 (two) times daily.   loratadine (CLARITIN) 10 MG tablet Take 10 mg by mouth in the morning.   Multiple Vitamin (MULTIVITAMIN WITH MINERALS) TABS tablet Take 1 tablet by mouth daily.   Niacinamide-Zn-Cu-Methfo-Se-Cr  (NICOTINAMIDE PO) Take 1 tablet by mouth in the morning and at bedtime.   omeprazole (PRILOSEC OTC) 20 MG tablet Take 20 mg by mouth 2 (two) times daily before a meal.   OXYGEN Inhale 2-2.5 L/min into the lungs See admin instructions. Inhale 2 L/min of oxygen into the lungs at bedtime and when ambulatory   predniSONE (DELTASONE) 10 MG tablet Taper to 10 mg one daily   sertraline (ZOLOFT) 50 MG tablet Take 1 tablet (50 mg total) by mouth daily.   sodium chloride (OCEAN) 0.65 % nasal spray Place 1 spray into the nose as needed for congestion.   No facility-administered encounter medications on file as of 06/04/2021.   Current heart failure regimen: , bisoprolol 5 mg daily Anticoagulation: Eliquis 5 mg bid  Are you taking a diuretic? Yes How often:  Do you weigh yourself daily or regularly? Yes  Have any of the following symptoms worsened or changed from your baseline?   denies worsening of SOB, increased swelling, or abnormal weight gain of more than 3 pounds in one day or 5 pounds in one week  Do you see a cardiologist? Yes              How often are you checking your Blood Pressure? daily  he checks his blood pressure in the morning before taking his medication.  Patient stated he is doing much better after the medication change.  He said his numbers have been 118-127 over 60's and 70's.  He said he feels better  Wrist or arm cuff: Arm cuff that goes directly to office Caffeine intake: None Salt intake: None OTC medications including pseudoephedrine or NSAIDs? None Exercise habits: Patient stays active, is walking with no oxygen  Care Gaps: Last annual wellness visit? 03/05/21  Star Rating Drugs:  Medication:  Last Fill: Day Supply Atorvastatin  05/30/21  90 Eliquis-gets from PAP   Clarita Leber, [place] Pharmacist Assistant [phone]

## 2021-06-05 DIAGNOSIS — L570 Actinic keratosis: Secondary | ICD-10-CM | POA: Diagnosis not present

## 2021-06-05 DIAGNOSIS — D0461 Carcinoma in situ of skin of right upper limb, including shoulder: Secondary | ICD-10-CM | POA: Diagnosis not present

## 2021-06-05 DIAGNOSIS — C4442 Squamous cell carcinoma of skin of scalp and neck: Secondary | ICD-10-CM | POA: Diagnosis not present

## 2021-06-08 DIAGNOSIS — R310 Gross hematuria: Secondary | ICD-10-CM | POA: Diagnosis not present

## 2021-06-21 DIAGNOSIS — I480 Paroxysmal atrial fibrillation: Secondary | ICD-10-CM | POA: Diagnosis not present

## 2021-06-21 DIAGNOSIS — E119 Type 2 diabetes mellitus without complications: Secondary | ICD-10-CM | POA: Diagnosis not present

## 2021-06-21 DIAGNOSIS — I10 Essential (primary) hypertension: Secondary | ICD-10-CM | POA: Diagnosis not present

## 2021-06-21 DIAGNOSIS — J449 Chronic obstructive pulmonary disease, unspecified: Secondary | ICD-10-CM | POA: Diagnosis not present

## 2021-06-27 ENCOUNTER — Other Ambulatory Visit: Payer: Self-pay | Admitting: Family Medicine

## 2021-07-03 ENCOUNTER — Other Ambulatory Visit: Payer: Medicare Other

## 2021-07-03 DIAGNOSIS — E782 Mixed hyperlipidemia: Secondary | ICD-10-CM | POA: Diagnosis not present

## 2021-07-03 DIAGNOSIS — E1169 Type 2 diabetes mellitus with other specified complication: Secondary | ICD-10-CM | POA: Diagnosis not present

## 2021-07-04 LAB — COMPREHENSIVE METABOLIC PANEL
ALT: 25 IU/L (ref 0–44)
AST: 24 IU/L (ref 0–40)
Albumin/Globulin Ratio: 1.8 (ref 1.2–2.2)
Albumin: 4.2 g/dL (ref 3.7–4.7)
Alkaline Phosphatase: 73 IU/L (ref 44–121)
BUN/Creatinine Ratio: 19 (ref 10–24)
BUN: 17 mg/dL (ref 8–27)
Bilirubin Total: 0.2 mg/dL (ref 0.0–1.2)
CO2: 27 mmol/L (ref 20–29)
Calcium: 9.7 mg/dL (ref 8.6–10.2)
Chloride: 99 mmol/L (ref 96–106)
Creatinine, Ser: 0.9 mg/dL (ref 0.76–1.27)
Globulin, Total: 2.3 g/dL (ref 1.5–4.5)
Glucose: 98 mg/dL (ref 65–99)
Potassium: 4.4 mmol/L (ref 3.5–5.2)
Sodium: 141 mmol/L (ref 134–144)
Total Protein: 6.5 g/dL (ref 6.0–8.5)
eGFR: 89 mL/min/{1.73_m2} (ref >59)

## 2021-07-04 LAB — CBC WITH DIFFERENTIAL/PLATELET
Basophils Absolute: 0 10*3/uL (ref 0.0–0.2)
Basos: 0 %
EOS (ABSOLUTE): 0.1 10*3/uL (ref 0.0–0.4)
Eos: 1 %
Hematocrit: 40.3 % (ref 37.5–51.0)
Hemoglobin: 12.4 g/dL — ABNORMAL LOW (ref 13.0–17.7)
Immature Grans (Abs): 0 10*3/uL (ref 0.0–0.1)
Immature Granulocytes: 0 %
Lymphocytes Absolute: 3.4 10*3/uL — ABNORMAL HIGH (ref 0.7–3.1)
Lymphs: 34 %
MCH: 24.4 pg — ABNORMAL LOW (ref 26.6–33.0)
MCHC: 30.8 g/dL — ABNORMAL LOW (ref 31.5–35.7)
MCV: 79 fL (ref 79–97)
Monocytes Absolute: 1 10*3/uL — ABNORMAL HIGH (ref 0.1–0.9)
Monocytes: 10 %
Neutrophils Absolute: 5.3 10*3/uL (ref 1.4–7.0)
Neutrophils: 55 %
Platelets: 307 10*3/uL (ref 150–450)
RBC: 5.08 x10E6/uL (ref 4.14–5.80)
RDW: 14.4 % (ref 11.6–15.4)
WBC: 9.9 10*3/uL (ref 3.4–10.8)

## 2021-07-04 LAB — LIPID PANEL
Chol/HDL Ratio: 3 ratio (ref 0.0–5.0)
Cholesterol, Total: 159 mg/dL (ref 100–199)
HDL: 53 mg/dL (ref >39)
LDL Chol Calc (NIH): 83 mg/dL (ref 0–99)
Triglycerides: 131 mg/dL (ref 0–149)
VLDL Cholesterol Cal: 23 mg/dL (ref 5–40)

## 2021-07-04 LAB — HEMOGLOBIN A1C
Est. average glucose Bld gHb Est-mCnc: 134 mg/dL
Hgb A1c MFr Bld: 6.3 % — ABNORMAL HIGH (ref 4.8–5.6)

## 2021-07-04 LAB — CARDIOVASCULAR RISK ASSESSMENT

## 2021-07-04 NOTE — H&P (View-Only) (Signed)
Electrophysiology Office Note:    Date:  07/06/2021   ID:  Paul [name], DOB 08-Apr-1946, MRN [id number]  PCP:  [name], MD  [facility] Cardiologist:  Candee Furbish, MD  [facility] Electrophysiologist:  Vickie Epley, MD   Referring MD: Jerline Pain, MD   Chief Complaint: pAF  History of Present Illness:    Paul [name] is a 75 y.o. male who presents for an evaluation of pAF at the request of Dr Marlou Porch. Their medical history includes AF, COPD, DM, GAD, HTN. The patient last saw Dr Marlou Porch on 05/17/2021.   His diltiazem was stopped at the last appointment with Dr Marlou Porch because of lightheadedness. The patient is also on bisporolol.  Unfortunately he has suffered with hematuria. Given the ongoing trouble with bleeding and his AF, the patient presents today to discuss the Watchman device and its potential role in stroke prphylaxis.   The patient is in clinic today with his wife.  He tells me that he was hospitalized several times in January of this year because of [id number] and complications related to [place].  He tells me that recently his breathing is the best it has been in 3 years.  He is quite active.  He goes grocery shopping without any shortness of breath.  He is getting back to playing golf.  He uses oxygen only at night.  He goes to the gym and exercises routinely.  He just saw his pulmonologist in the last few weeks.  Past Medical History:  Diagnosis Date   A-fib ([place])    Anemia    Aortic atherosclerosis (HCC)    Atrial fibrillation (HCC)    Back pain    COPD (chronic obstructive pulmonary disease) (HCC)    Diabetes mellitus without complication (HCC)    Elevated PSA    GAD (generalized anxiety disorder)    High cholesterol    Hypertension    Osteoporosis    Oxygen deficiency     Past Surgical History:  Procedure Laterality Date   CATARACT EXTRACTION Right    SKIN SURGERY     shoulders and chest    Current Medications: Current Meds  Medication Sig    albuterol (PROAIR HFA) 108 (90 Base) MCG/ACT inhaler Inhale 2 puffs into the lungs every 6 (six) hours as needed for wheezing or shortness of breath.    albuterol (PROVENTIL) (2.5 MG/3ML) 0.083% nebulizer solution Take 3 mLs (2.5 mg total) by nebulization in the morning and at bedtime.   ALPRAZolam (XANAX) 0.25 MG tablet Take 1 tablet (0.25 mg total) by mouth 2 (two) times daily as needed for anxiety.   apixaban (ELIQUIS) 5 MG TABS tablet Take 1 tablet (5 mg total) by mouth 2 (two) times daily.   ascorbic acid (VITAMIN C) 500 MG tablet Take 1 tablet (500 mg total) by mouth daily.   atorvastatin (LIPITOR) 40 MG tablet TAKE 1 TABLET(40 MG) BY MOUTH DAILY AFTER SUPPER   bisoprolol (ZEBETA) 5 MG tablet Take 1 tablet (5 mg total) by mouth daily.   Budeson-Glycopyrrol-Formoterol (BREZTRI AEROSPHERE) 160-9-4.8 MCG/ACT AERO Inhale 2 puffs into the lungs 2 (two) times daily.   Budeson-Glycopyrrol-Formoterol (BREZTRI AEROSPHERE) 160-9-4.8 MCG/ACT AERO Inhale 2 puffs into the lungs in the morning and at bedtime.   calcium carbonate (OS-CAL) 600 MG TABS tablet Take 600 mg by mouth daily.   Cholecalciferol (VITAMIN D) 50 MCG (2000 UT) CAPS Take 2,000 Units by mouth daily.   denosumab (PROLIA) 60 MG/ML SOSY injection Inject 60 mg into the  skin every 6 (six) months.   diltiazem (CARDIZEM) 60 MG tablet Take 1 tablet (60 mg total) by mouth 4 (four) times daily as needed.   ferrous sulfate 325 (65 FE) MG tablet Take 325 mg by mouth daily with breakfast.   furosemide (LASIX) 40 MG tablet Take 1 tablet (40 mg total) by mouth daily.   guaiFENesin (MUCINEX) 600 MG 12 hr tablet Take 1,200 mg by mouth 2 (two) times daily.   loratadine (CLARITIN) 10 MG tablet Take 10 mg by mouth in the morning.   Multiple Vitamin (MULTIVITAMIN WITH MINERALS) TABS tablet Take 1 tablet by mouth daily.   Niacinamide-Zn-Cu-Methfo-Se-Cr (NICOTINAMIDE PO) Take 1 tablet by mouth in the morning and at bedtime.   omeprazole (PRILOSEC OTC) 20 MG  tablet Take 20 mg by mouth 2 (two) times daily before a meal.   OXYGEN Inhale 2-2.5 L/min into the lungs See admin instructions. Inhale 2 L/min of oxygen into the lungs at bedtime and when ambulatory   predniSONE (DELTASONE) 5 MG tablet Take 5 mg by mouth daily with breakfast.   sertraline (ZOLOFT) 50 MG tablet Take 1 tablet by mouth once daily   sodium chloride (OCEAN) 0.65 % nasal spray Place 1 spray into the nose as needed for congestion.     Allergies:   Daliresp [roflumilast], Alendronate, and Levaquin [levofloxacin]   Social History   Socioeconomic History   Marital status: Married    Spouse name: Not on file   Number of children: 3   Years of education: Not on file   Highest education level: Not on file  Occupational History   Occupation: retired    Comment: truck driver  Tobacco Use   Smoking status: Former    Packs/day: 2.00    Years: 52.00    Pack years: 104.00    Types: Cigarettes    Quit date: 02/03/2009    Years since quitting: 12.4   Smokeless tobacco: Never   Tobacco comments:    Counseled to remain smoke free  Vaping Use   Vaping Use: Never used  Substance and Sexual Activity   Alcohol use: Not Currently    Alcohol/week: 0.0 standard drinks    Comment: occ   Drug use: No   Sexual activity: Not on file  Other Topics Concern   Not on file  Social History Narrative   Originally from [place]. Previously worked driving a Restaurant manager, fast food. No pets currently. No mold exposure. 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       wears sunscreen, brushes and flosses daily, see's dentist bi-annually, has smoke/carbon monoxide detectors, wears a seatbelt and practices gun safety      Social Determinants of Health   Financial Resource Strain: Not on file  Food Insecurity: No Food Insecurity   Worried About Charity fundraiser in the Last Year: Never true   Ran Out of Food in the Last Year: Never true   Transportation Needs: No Transportation Needs   Lack of Transportation (Medical): No   Lack of Transportation (Non-Medical): No  Physical Activity: Not on file  Stress: Not on file  Social Connections: Not on file     Family History: The patient's family history includes Cancer in his paternal uncle; Heart disease in his brother, father, and mother.  ROS:   Please see the history of present illness.    All other systems reviewed and are negative.  EKGs/Labs/Other Studies Reviewed:  The following studies were reviewed today:  04/19/2021 Zio Sinus rhythm with average heart rate of 80 bpm. Rare supraventricular tachycardia runs, paroxysmal atrial tachycardia, fastest lasting 7 beats at 141 bpm. Symptoms of lightheadedness and dizziness are all associated with sinus rhythm. No adverse arrhythmias. No atrial fibrillation, no pauses.    04/02/2020 Echo EF 60% RV normal Trivial MR    Recent Labs: 01/30/2021: B Natriuretic Peptide 90.9 07/03/2021: ALT 25; BUN 17; Creatinine, Ser 0.90; Hemoglobin 12.4; Platelets 307; Potassium 4.4; Sodium 141  Recent Lipid Panel    Component Value Date/Time   CHOL 159 07/03/2021 0812   TRIG 131 07/03/2021 0812   HDL 53 07/03/2021 0812   CHOLHDL 3.0 07/03/2021 0812   LDLCALC 83 07/03/2021 0812    Physical Exam:    VS:  BP 136/68   Pulse 81   Ht 5\' 6"  (1.676 m)   Wt 153 lb 12.8 oz (69.8 kg)   SpO2 97%   BMI 24.82 kg/m     Wt Readings from Last 3 Encounters:  07/06/21 153 lb 12.8 oz (69.8 kg)  07/05/21 153 lb 9.6 oz (69.7 kg)  05/29/21 155 lb 6.4 oz (70.5 kg)     GEN:  Well nourished, well developed in no acute distress HEENT: Normal NECK: No JVD; No carotid bruits LYMPHATICS: No lymphadenopathy CARDIAC: RRR, no murmurs, rubs, gallops RESPIRATORY:  Clear to auscultation without rales, wheezing or rhonchi.  Good aeration to both bases. ABDOMEN: Soft, non-tender, non-distended MUSCULOSKELETAL:  No edema; No deformity  SKIN:  Warm and dry NEUROLOGIC:  Alert and oriented x 3 PSYCHIATRIC:  Normal affect   ASSESSMENT:    1. Paroxysmal atrial fibrillation (HCC)   2. Essential hypertension   3. Chronic diastolic CHF (congestive heart failure) ([place])   4. Idiopathic hypotension    PLAN:    In order of problems listed above:   1. Paroxysmal atrial fibrillation (HCC) On Eliquis for stroke prophylaxis.  On diltiazem and bisoprolol.  Discussed using the watchman device for stroke prophylaxis in an effort to avoid long-term exposure to anticoagulation given his history of severe bleeding from skin tears and hematuria.  The only concern I have about his eligibility for this procedure is his respiratory status.  It seems that his breathing is the best it has been in several years during my visit with him today.  He is actually quite active despite his COPD diagnosis.  He does not require 24-hour oxygen support.  I would like to have his pulmonologist weigh in about the safety of general anesthesia.  If they think it is acceptable risk for him to undergo a procedure under general anesthesia, I think it would be reasonable for him to have the watchman procedure.  I will plan to reach out to his pulmonologist to see what their thoughts are about general anesthesia risk.  In the meantime, we will order the CT scanning to assess his left atrial appendage anatomy and echocardiogram.  If the pulmonologist thinks it is acceptable to proceed for a general anesthesia procedure, we will plan to schedule him for watchman implant.  He will continue Eliquis uninterrupted for now.  This would continue for at least 45 days after the watchman implant.  I have seen Paul [name] in the office today who is being considered for a Watchman left atrial appendage closure device. I believe they will benefit from this procedure given their history of atrial fibrillation, CHA2DS2-VASc score of 3 (CHF and age) and unadjusted ischemic stroke rate  of  3.2% per year. Unfortunately, the patient is not felt to be a long term anticoagulation candidate secondary to history of falls, hematuria, skin tears with bleeding. The patient's chart has been reviewed and I feel that they would be a candidate for short term oral anticoagulation after Watchman implant.   It is my belief that after undergoing a LAA closure procedure, Paul [name] will not need long term anticoagulation which eliminates anticoagulation side effects and major bleeding risk.   Procedural risks for the Watchman implant have been reviewed with the patient including a 0.5% risk of stroke, <1% risk of perforation and <1% risk of device embolization.  His risk is likely slightly higher because of his history of COPD.   The published clinical data on the safety and effectiveness of WATCHMAN include but are not limited to the following: - Holmes DR, Mechele Claude, Sick P et al. for the PROTECT AF Investigators. Percutaneous closure of the left atrial appendage versus warfarin therapy for prevention of stroke in patients with atrial fibrillation: a randomised non-inferiority trial. Lancet 2009; 374: 534-42. Mechele Claude, Doshi SK, Abelardo Diesel D et al. on behalf of the PROTECT AF Investigators. Percutaneous Left Atrial Appendage Closure for Stroke Prophylaxis in Patients With Atrial Fibrillation 2.3-Year Follow-up of the PROTECT AF (Watchman Left Atrial Appendage System for Embolic Protection in Patients With Atrial Fibrillation) Trial. Circulation 2013; 127:720-729. - Alli O, Doshi S,  Kar S, Reddy VY, Sievert H et al. Quality of Life Assessment in the Randomized PROTECT AF (Percutaneous Closure of the Left Atrial Appendage Versus Warfarin Therapy for Prevention of Stroke in Patients With Atrial Fibrillation) Trial of Patients at Risk for Stroke With Nonvalvular Atrial Fibrillation. J Am Coll Cardiol 2013; 16:1096-0. [name] DR, Tarri Abernethy, Price M, [place], Sievert H, Doshi S, Huber K, Reddy V.  Prospective randomized evaluation of the Watchman left atrial appendage Device in patients with atrial fibrillation versus long-term warfarin therapy; the PREVAIL trial. Journal of the [employer] of Cardiology, Vol. 4, No. 1, 2014, 1-11. - Kar S, Doshi SK, Sadhu A, Horton R, Osorio J et al. Primary outcome evaluation of a next-generation left atrial appendage closure device: results from the PINNACLE FLX trial. Circulation 2021;143(18)1754-1762.    After today's visit with the patient which was dedicated solely for shared decision making visit regarding LAA closure device, the patient decided to proceed with the LAA appendage closure procedure scheduled to be done in the near future at [facility]. Prior to the procedure, I would like to obtain a gated CT scan of the chest with contrast timed for PV/LA visualization.   Additionally, the patient will need a transthoracic echocardiogram.   2. Essential hypertension Controlled.  Continue current regimen.  3. Chronic diastolic CHF (congestive heart failure) (HCC) NYHA class II.  Warm and dry on exam.  4.  COPD    Follow-up based on pulmonologist recommendations.    Total time spent with patient today 65 minutes. This includes reviewing records, evaluating the patient and coordinating care.  Medication Adjustments/Labs and Tests Ordered: Current medicines are reviewed at length with the patient today.  Concerns regarding medicines are outlined above.  Orders Placed This Encounter  Procedures   CT CARDIAC MORPH/PULM VEIN W/CM&W/O CA SCORE   ECHOCARDIOGRAM COMPLETE    No orders of the defined types were placed in this encounter.    Signed, Hilton Cork. Quentin Ore, MD, [facility], [facility] 07/06/2021 10:32 AM    Electrophysiology  Medical Group  HeartCare  

## 2021-07-04 NOTE — Progress Notes (Signed)
Electrophysiology Office Note:    Date:  07/06/2021   ID:  Paul [name], DOB [birth year], MRN [id number]  PCP:  [name], MD  [facility] Cardiologist:  Candee Furbish, MD  [facility] Electrophysiologist:  Vickie Epley, MD   Referring MD: Jerline Pain, MD   Chief Complaint: pAF  History of Present Illness:    Paul [name] is a 75 y.o. male who presents for an evaluation of pAF at the request of Dr Marlou Porch. Their medical history includes AF, COPD, DM, GAD, HTN. The patient last saw Dr Marlou Porch on 05/17/2021.   His diltiazem was stopped at the last appointment with Dr Marlou Porch because of lightheadedness. The patient is also on bisporolol.  Unfortunately he has suffered with hematuria. Given the ongoing trouble with bleeding and his AF, the patient presents today to discuss the Watchman device and its potential role in stroke prphylaxis.   The patient is in clinic today with his wife.  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  He tells me that recently his breathing is the best it has been in 3 years.  He is quite active.  He goes grocery shopping without any shortness of breath.  He is getting back to playing golf.  He uses oxygen only at night.  He goes to the gym and exercises routinely.  He just saw his pulmonologist in the last few weeks.  Past Medical History:  Diagnosis Date   A-fib ([place])    Anemia    Aortic atherosclerosis (HCC)    Atrial fibrillation (HCC)    Back pain    COPD (chronic obstructive pulmonary disease) (HCC)    Diabetes mellitus without complication (HCC)    Elevated PSA    GAD (generalized anxiety disorder)    High cholesterol    Hypertension    Osteoporosis    Oxygen deficiency     Past Surgical History:  Procedure Laterality Date   CATARACT EXTRACTION Right    SKIN SURGERY     shoulders and chest    Current Medications: Current Meds  Medication Sig    albuterol (PROAIR HFA) 108 (90 Base) MCG/ACT inhaler Inhale 2 puffs into the lungs every 6 (six) hours as needed for wheezing or shortness of breath.    albuterol (PROVENTIL) (2.5 MG/3ML) 0.083% nebulizer solution Take 3 mLs (2.5 mg total) by nebulization in the morning and at bedtime.   ALPRAZolam (XANAX) 0.25 MG tablet Take 1 tablet (0.25 mg total) by mouth 2 (two) times daily as needed for anxiety.   apixaban (ELIQUIS) 5 MG TABS tablet Take 1 tablet (5 mg total) by mouth 2 (two) times daily.   ascorbic acid (VITAMIN C) 500 MG tablet Take 1 tablet (500 mg total) by mouth daily.   atorvastatin (LIPITOR) 40 MG tablet TAKE 1 TABLET(40 MG) BY MOUTH DAILY AFTER SUPPER   bisoprolol (ZEBETA) 5 MG tablet Take 1 tablet (5 mg total) by mouth daily.   Budeson-Glycopyrrol-Formoterol (BREZTRI AEROSPHERE) 160-9-4.8 MCG/ACT AERO Inhale 2 puffs into the lungs 2 (two) times daily.   Budeson-Glycopyrrol-Formoterol (BREZTRI AEROSPHERE) 160-9-4.8 MCG/ACT AERO Inhale 2 puffs into the lungs in the morning and at bedtime.   calcium carbonate (OS-CAL) 600 MG TABS tablet Take 600 mg by mouth daily.   Cholecalciferol (VITAMIN D) 50 MCG (2000 UT) CAPS Take 2,000 Units by mouth daily.   denosumab (PROLIA) 60 MG/ML SOSY injection Inject 60 mg into the  skin every 6 (six) months.   diltiazem (CARDIZEM) 60 MG tablet Take 1 tablet (60 mg total) by mouth 4 (four) times daily as needed.   ferrous sulfate 325 (65 FE) MG tablet Take 325 mg by mouth daily with breakfast.   furosemide (LASIX) 40 MG tablet Take 1 tablet (40 mg total) by mouth daily.   guaiFENesin (MUCINEX) 600 MG 12 hr tablet Take 1,200 mg by mouth 2 (two) times daily.   loratadine (CLARITIN) 10 MG tablet Take 10 mg by mouth in the morning.   Multiple Vitamin (MULTIVITAMIN WITH MINERALS) TABS tablet Take 1 tablet by mouth daily.   Niacinamide-Zn-Cu-Methfo-Se-Cr (NICOTINAMIDE PO) Take 1 tablet by mouth in the morning and at bedtime.   omeprazole (PRILOSEC OTC) 20 MG  tablet Take 20 mg by mouth 2 (two) times daily before a meal.   OXYGEN Inhale 2-2.5 L/min into the lungs See admin instructions. Inhale 2 L/min of oxygen into the lungs at bedtime and when ambulatory   predniSONE (DELTASONE) 5 MG tablet Take 5 mg by mouth daily with breakfast.   sertraline (ZOLOFT) 50 MG tablet Take 1 tablet by mouth once daily   sodium chloride (OCEAN) 0.65 % nasal spray Place 1 spray into the nose as needed for congestion.     Allergies:   Daliresp [roflumilast], Alendronate, and Levaquin [levofloxacin]   Social History   Socioeconomic History   Marital status: Married    Spouse name: Not on file   Number of children: 3   Years of education: Not on file   Highest education level: Not on file  Occupational History   Occupation: retired    Comment: truck driver  Tobacco Use   Smoking status: Former    Packs/day: 2.00    Years: 52.00    Pack years: 104.00    Types: Cigarettes    Quit date: 02/03/2009    Years since quitting: 12.4   Smokeless tobacco: Never   Tobacco comments:    Counseled to remain smoke free  Vaping Use   Vaping Use: Never used  Substance and Sexual Activity   Alcohol use: Not Currently    Alcohol/week: 0.0 standard drinks    Comment: occ   Drug use: No   Sexual activity: Not on file  Other Topics Concern   Not on file  Social History Narrative   Originally from [place]. Previously worked driving a Restaurant manager, fast food. No pets currently. No mold exposure. 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       wears sunscreen, brushes and flosses daily, see's dentist bi-annually, has smoke/carbon monoxide detectors, wears a seatbelt and practices gun safety      Social Determinants of Health   Financial Resource Strain: Not on file  Food Insecurity: No Food Insecurity   Worried About Charity fundraiser in the Last Year: Never true   Ran Out of Food in the Last Year: Never true   Transportation Needs: No Transportation Needs   Lack of Transportation (Medical): No   Lack of Transportation (Non-Medical): No  Physical Activity: Not on file  Stress: Not on file  Social Connections: Not on file     Family History: The patient's family history includes Cancer in his paternal uncle; Heart disease in his brother, father, and mother.  ROS:   Please see the history of present illness.    All other systems reviewed and are negative.  EKGs/Labs/Other Studies Reviewed:  The following studies were reviewed today:  04/19/2021 Zio Sinus rhythm with average heart rate of 80 bpm. Rare supraventricular tachycardia runs, paroxysmal atrial tachycardia, fastest lasting 7 beats at 141 bpm. Symptoms of lightheadedness and dizziness are all associated with sinus rhythm. No adverse arrhythmias. No atrial fibrillation, no pauses.    04/02/2020 Echo EF 60% RV normal Trivial MR    Recent Labs: 01/30/2021: B Natriuretic Peptide 90.9 07/03/2021: ALT 25; BUN 17; Creatinine, Ser 0.90; Hemoglobin 12.4; Platelets 307; Potassium 4.4; Sodium 141  Recent Lipid Panel    Component Value Date/Time   CHOL 159 07/03/2021 0812   TRIG 131 07/03/2021 0812   HDL 53 07/03/2021 0812   CHOLHDL 3.0 07/03/2021 0812   LDLCALC 83 07/03/2021 0812    Physical Exam:    VS:  BP 136/68   Pulse 81   Ht 5\' 6"  (1.676 m)   Wt 153 lb 12.8 oz (69.8 kg)   SpO2 97%   BMI 24.82 kg/m     Wt Readings from Last 3 Encounters:  07/06/21 153 lb 12.8 oz (69.8 kg)  07/05/21 153 lb 9.6 oz (69.7 kg)  05/29/21 155 lb 6.4 oz (70.5 kg)     GEN:  Well nourished, well developed in no acute distress HEENT: Normal NECK: No JVD; No carotid bruits LYMPHATICS: No lymphadenopathy CARDIAC: RRR, no murmurs, rubs, gallops RESPIRATORY:  Clear to auscultation without rales, wheezing or rhonchi.  Good aeration to both bases. ABDOMEN: Soft, non-tender, non-distended MUSCULOSKELETAL:  No edema; No deformity  SKIN:  Warm and dry NEUROLOGIC:  Alert and oriented x 3 PSYCHIATRIC:  Normal affect   ASSESSMENT:    1. Paroxysmal atrial fibrillation (HCC)   2. Essential hypertension   3. Chronic diastolic CHF (congestive heart failure) ([place])   4. Idiopathic hypotension    PLAN:    In order of problems listed above:   1. Paroxysmal atrial fibrillation (HCC) On Eliquis for stroke prophylaxis.  On diltiazem and bisoprolol.  Discussed using the watchman device for stroke prophylaxis in an effort to avoid long-term exposure to anticoagulation given his history of severe bleeding from skin tears and hematuria.  The only concern I have about his eligibility for this procedure is his respiratory status.  It seems that his breathing is the best it has been in several years during my visit with him today.  He is actually quite active despite his COPD diagnosis.  He does not require 24-hour oxygen support.  I would like to have his pulmonologist weigh in about the safety of general anesthesia.  If they think it is acceptable risk for him to undergo a procedure under general anesthesia, I think it would be reasonable for him to have the watchman procedure.  I will plan to reach out to his pulmonologist to see what their thoughts are about general anesthesia risk.  In the meantime, we will order the CT scanning to assess his left atrial appendage anatomy and echocardiogram.  If the pulmonologist thinks it is acceptable to proceed for a general anesthesia procedure, we will plan to schedule him for watchman implant.  He will continue Eliquis uninterrupted for now.  This would continue for at least 45 days after the watchman implant.  I have seen Paul [name] in the office today who is being considered for a Watchman left atrial appendage closure device. I believe they will benefit from this procedure given their history of atrial fibrillation, CHA2DS2-VASc score of 3 (CHF and age) and unadjusted ischemic stroke rate  of  3.2% per year. Unfortunately, the patient is not felt to be a long term anticoagulation candidate secondary to history of falls, hematuria, skin tears with bleeding. The patient's chart has been reviewed and I feel that they would be a candidate for short term oral anticoagulation after Watchman implant.   It is my belief that after undergoing a LAA closure procedure, Sargon Scouten will not need long term anticoagulation which eliminates anticoagulation side effects and major bleeding risk.   Procedural risks for the Watchman implant have been reviewed with the patient including a 0.5% risk of stroke, <1% risk of perforation and <1% risk of device embolization.  His risk is likely slightly higher because of his history of COPD.   The published clinical data on the safety and effectiveness of WATCHMAN include but are not limited to the following: - Holmes DR, Mechele Claude, Sick P et al. for the PROTECT AF Investigators. Percutaneous closure of the left atrial appendage versus warfarin therapy for prevention of stroke in patients with atrial fibrillation: a randomised non-inferiority trial. Lancet 2009; 374: 534-42. Mechele Claude, Doshi SK, Abelardo Diesel D et al. on behalf of the PROTECT AF Investigators. Percutaneous Left Atrial Appendage Closure for Stroke Prophylaxis in Patients With Atrial Fibrillation 2.3-Year Follow-up of the PROTECT AF (Watchman Left Atrial Appendage System for Embolic Protection in Patients With Atrial Fibrillation) Trial. Circulation 2013; 127:720-729. - Alli O, Doshi S,  Kar S, Reddy VY, Sievert H et al. Quality of Life Assessment in the Randomized PROTECT AF (Percutaneous Closure of the Left Atrial Appendage Versus Warfarin Therapy for Prevention of Stroke in Patients With Atrial Fibrillation) Trial of Patients at Risk for Stroke With Nonvalvular Atrial Fibrillation. J Am Coll Cardiol 2013; 40:9811-9. [name] DR, Tarri Abernethy, Price M, [place], Sievert H, Doshi S, Huber K, Reddy V.  Prospective randomized evaluation of the Watchman left atrial appendage Device in patients with atrial fibrillation versus long-term warfarin therapy; the PREVAIL trial. Journal of the [employer] of Cardiology, Vol. 4, No. 1, 2014, 1-11. - Kar S, Doshi SK, Sadhu A, Horton R, Osorio J et al. Primary outcome evaluation of a next-generation left atrial appendage closure device: results from the PINNACLE FLX trial. Circulation 2021;143(18)1754-1762.    After today's visit with the patient which was dedicated solely for shared decision making visit regarding LAA closure device, the patient decided to proceed with the LAA appendage closure procedure scheduled to be done in the near future at [facility]. Prior to the procedure, I would like to obtain a gated CT scan of the chest with contrast timed for PV/LA visualization.   Additionally, the patient will need a transthoracic echocardiogram.   2. Essential hypertension Controlled.  Continue current regimen.  3. Chronic diastolic CHF (congestive heart failure) (HCC) NYHA class II.  Warm and dry on exam.  4.  COPD    Follow-up based on pulmonologist recommendations.    Total time spent with patient today 65 minutes. This includes reviewing records, evaluating the patient and coordinating care.  Medication Adjustments/Labs and Tests Ordered: Current medicines are reviewed at length with the patient today.  Concerns regarding medicines are outlined above.  Orders Placed This Encounter  Procedures   CT CARDIAC MORPH/PULM VEIN W/CM&W/O CA SCORE   ECHOCARDIOGRAM COMPLETE    No orders of the defined types were placed in this encounter.    Signed, Hilton Cork. Quentin Ore, MD, [facility], [facility] 07/06/2021 10:32 AM    Electrophysiology [facility]  [facility]  

## 2021-07-05 ENCOUNTER — Encounter: Payer: Self-pay | Admitting: Family Medicine

## 2021-07-05 ENCOUNTER — Ambulatory Visit (INDEPENDENT_AMBULATORY_CARE_PROVIDER_SITE_OTHER): Payer: Medicare Other | Admitting: Family Medicine

## 2021-07-05 ENCOUNTER — Ambulatory Visit (INDEPENDENT_AMBULATORY_CARE_PROVIDER_SITE_OTHER): Payer: Medicare Other

## 2021-07-05 ENCOUNTER — Other Ambulatory Visit: Payer: Self-pay

## 2021-07-05 VITALS — BP 114/60 | HR 84 | Temp 97.2°F | Resp 18 | Ht 66.0 in | Wt 153.6 lb

## 2021-07-05 DIAGNOSIS — E782 Mixed hyperlipidemia: Secondary | ICD-10-CM

## 2021-07-05 DIAGNOSIS — D6869 Other thrombophilia: Secondary | ICD-10-CM | POA: Diagnosis not present

## 2021-07-05 DIAGNOSIS — I482 Chronic atrial fibrillation, unspecified: Secondary | ICD-10-CM

## 2021-07-05 DIAGNOSIS — Z23 Encounter for immunization: Secondary | ICD-10-CM | POA: Diagnosis not present

## 2021-07-05 DIAGNOSIS — E1169 Type 2 diabetes mellitus with other specified complication: Secondary | ICD-10-CM | POA: Diagnosis not present

## 2021-07-05 DIAGNOSIS — J9611 Chronic respiratory failure with hypoxia: Secondary | ICD-10-CM | POA: Diagnosis not present

## 2021-07-05 DIAGNOSIS — Z9981 Dependence on supplemental oxygen: Secondary | ICD-10-CM | POA: Diagnosis not present

## 2021-07-05 DIAGNOSIS — I10 Essential (primary) hypertension: Secondary | ICD-10-CM | POA: Diagnosis not present

## 2021-07-05 DIAGNOSIS — D508 Other iron deficiency anemias: Secondary | ICD-10-CM

## 2021-07-05 DIAGNOSIS — M818 Other osteoporosis without current pathological fracture: Secondary | ICD-10-CM

## 2021-07-05 DIAGNOSIS — J449 Chronic obstructive pulmonary disease, unspecified: Secondary | ICD-10-CM | POA: Diagnosis not present

## 2021-07-05 NOTE — Progress Notes (Signed)
Subjective:  Patient ID: Paul [name], male    DOB: Jul 22, 1946  Age: 75 y.o. MRN: [id number]  Chief Complaint  Patient presents with   Hypertension   COPD   HPI COPD: down to prednisone 5 mg once daily and on breztri 2 puffs twice a day.  Atrial Fibrillation: on Eliquis 5 mg one twice a day on bisoprolol and cardizem 60 mg qid prn. Hyperlipidemia: ON atorvastatin 40 mg once daily. Eating healthy and exercising (4 days a week.) Osteoporosis: prolia Anemia: On iron sulfated 325 mg one twice a day.  EDEMa: on lasix.  Depression/anxiety: on zoloft once daily. Works Engineer, manufacturing. Doesn't use xanax.  Prednisone induced diabetes: pt is on no dm medicines. Does not need to check sugars.   Current Outpatient Medications on File Prior to Visit  Medication Sig Dispense Refill   predniSONE (DELTASONE) 5 MG tablet Take 5 mg by mouth daily with breakfast.     albuterol (PROAIR HFA) 108 (90 Base) MCG/ACT inhaler Inhale 2 puffs into the lungs every 6 (six) hours as needed for wheezing or shortness of breath.      albuterol (PROVENTIL) (2.5 MG/3ML) 0.083% nebulizer solution Take 3 mLs (2.5 mg total) by nebulization in the morning and at bedtime. 75 mL 6   ALPRAZolam (XANAX) 0.25 MG tablet Take 1 tablet (0.25 mg total) by mouth 2 (two) times daily as needed for anxiety. 60 tablet 0   apixaban (ELIQUIS) 5 MG TABS tablet Take 1 tablet (5 mg total) by mouth 2 (two) times daily. 180 tablet 1   ascorbic acid (VITAMIN C) 500 MG tablet Take 1 tablet (500 mg total) by mouth daily.     atorvastatin (LIPITOR) 40 MG tablet TAKE 1 TABLET(40 MG) BY MOUTH DAILY AFTER SUPPER 90 tablet 1   bisoprolol (ZEBETA) 5 MG tablet Take 1 tablet (5 mg total) by mouth daily. 90 tablet 3   Budeson-Glycopyrrol-Formoterol (BREZTRI AEROSPHERE) 160-9-4.8 MCG/ACT AERO Inhale 2 puffs into the lungs 2 (two) times daily. 32.1 g 3   Budeson-Glycopyrrol-Formoterol (BREZTRI AEROSPHERE) 160-9-4.8 MCG/ACT AERO Inhale 2 puffs into the lungs in the  morning and at bedtime. 5.9 g 0   calcium carbonate (OS-CAL) 600 MG TABS tablet Take 600 mg by mouth daily.     Cholecalciferol (VITAMIN D) 50 MCG (2000 UT) CAPS Take 2,000 Units by mouth daily.     denosumab (PROLIA) 60 MG/ML SOSY injection Inject 60 mg into the skin every 6 (six) months.     diltiazem (CARDIZEM) 60 MG tablet Take 1 tablet (60 mg total) by mouth 4 (four) times daily as needed. 30 tablet prn   ferrous sulfate 325 (65 FE) MG tablet Take 325 mg by mouth daily with breakfast.     furosemide (LASIX) 40 MG tablet Take 1 tablet (40 mg total) by mouth daily. 90 tablet 3   guaiFENesin (MUCINEX) 600 MG 12 hr tablet Take 1,200 mg by mouth 2 (two) times daily.     loratadine (CLARITIN) 10 MG tablet Take 10 mg by mouth in the morning.     Multiple Vitamin (MULTIVITAMIN WITH MINERALS) TABS tablet Take 1 tablet by mouth daily.     Niacinamide-Zn-Cu-Methfo-Se-Cr (NICOTINAMIDE PO) Take 1 tablet by mouth in the morning and at bedtime.     omeprazole (PRILOSEC OTC) 20 MG tablet Take 20 mg by mouth 2 (two) times daily before a meal.     OXYGEN Inhale 2-2.5 L/min into the lungs See admin instructions. Inhale 2 L/min of oxygen into the lungs  at bedtime and when ambulatory     sertraline (ZOLOFT) 50 MG tablet Take 1 tablet by mouth once daily 90 tablet 1   sodium chloride (OCEAN) 0.65 % nasal spray Place 1 spray into the nose as needed for congestion.     No current facility-administered medications on file prior to visit.   Past Medical History:  Diagnosis Date   A-fib ([place])    Anemia    Aortic atherosclerosis (HCC)    Atrial fibrillation (HCC)    Back pain    COPD (chronic obstructive pulmonary disease) (HCC)    Diabetes mellitus without complication (HCC)    Elevated PSA    GAD (generalized anxiety disorder)    High cholesterol    Hypertension    Osteoporosis    Oxygen deficiency    Past Surgical History:  Procedure Laterality Date   CATARACT EXTRACTION Right    SKIN SURGERY      shoulders and chest    Family History  Problem Relation Age of Onset   Heart disease Brother    Heart disease Mother    Heart disease Father    Cancer Paternal Uncle    Social History   Socioeconomic History   Marital status: Married    Spouse name: Not on file   Number of children: 3   Years of education: Not on file   Highest education level: Not on file  Occupational History   Occupation: retired    Comment: truck Geophysicist/field seismologist  Tobacco Use   Smoking status: Former    Packs/day: 2.00    Years: 52.00    Pack years: 104.00    Types: Cigarettes    Quit date: 02/03/2009    Years since quitting: 12.4   Smokeless tobacco: Never   Tobacco comments:    Counseled to remain smoke free  Vaping Use   Vaping Use: Never used  Substance and Sexual Activity   Alcohol use: Not Currently    Alcohol/week: 0.0 standard drinks    Comment: occ   Drug use: No   Sexual activity: Not on file  Other Topics Concern   Not on file  Social History Narrative   Originally from [place]. Previously worked driving a Restaurant manager, fast food. No pets currently. No mold exposure. 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       wears sunscreen, brushes and flosses daily, see's dentist bi-annually, has smoke/carbon monoxide detectors, wears a seatbelt and practices gun safety      Social Determinants of Health   Financial Resource Strain: Not on file  Food Insecurity: No Food Insecurity   Worried About Charity fundraiser in the Last Year: Never true   Ran Out of Food in the Last Year: Never true  Transportation Needs: No Transportation Needs   Lack of Transportation (Medical): No   Lack of Transportation (Non-Medical): No  Physical Activity: Not on file  Stress: Not on file  Social Connections: Not on file    Review of Systems  Constitutional:  Negative for chills and fever.  HENT:  Negative for congestion, rhinorrhea and sore throat.    Respiratory:  Positive for cough and shortness of breath.   Cardiovascular:  Negative for chest pain and palpitations.  Gastrointestinal:  Negative for abdominal pain, constipation, diarrhea, nausea and vomiting.  Endocrine: Negative for polydipsia, polyphagia and polyuria.  Genitourinary:  Negative for dysuria and urgency.  Musculoskeletal:  Negative for arthralgias, back  pain and myalgias.  Neurological:  Negative for dizziness, numbness and headaches.  Psychiatric/Behavioral:  Negative for dysphoric mood. The patient is not nervous/anxious.     Objective:  BP 114/60   Pulse 84   Temp (!) 97.2 F (36.2 C)   Resp 18   Ht 5\' 6"  (1.676 m)   Wt 153 lb 9.6 oz (69.7 kg)   BMI 24.79 kg/m   BP/Weight 07/06/2021 2/42/3536 12/26/4313  Systolic BP 400 867 619  Diastolic BP 68 60 54  Wt. (Lbs) 153.8 153.6 155.4  BMI 24.82 24.79 25.08    Physical Exam Vitals reviewed.  Constitutional:      Appearance: Normal appearance.  Neck:     Vascular: No carotid bruit.  Cardiovascular:     Rate and Rhythm: Normal rate. Rhythm irregular.     Pulses: Normal pulses.     Heart sounds: Normal heart sounds.  Pulmonary:     Effort: Pulmonary effort is normal.     Breath sounds: Normal breath sounds. No wheezing, rhonchi or rales.  Abdominal:     General: Bowel sounds are normal.     Palpations: Abdomen is soft.     Tenderness: There is no abdominal tenderness.  Neurological:     Mental Status: He is alert and oriented to person, place, and time.  Psychiatric:        Mood and Affect: Mood normal.        Behavior: Behavior normal.    Diabetic Foot Exam - Simple   Simple Foot Form  07/05/2021  3:36 PM  Visual Inspection No deformities, no ulcerations, no other skin breakdown bilaterally: Yes Sensation Testing Intact to touch and monofilament testing bilaterally: Yes Pulse Check Posterior Tibialis and Dorsalis pulse intact bilaterally: Yes Comments      Lab Results  Component Value  Date   WBC 9.9 07/03/2021   HGB 12.4 (L) 07/03/2021   HCT 40.3 07/03/2021   PLT 307 07/03/2021   GLUCOSE 98 07/03/2021   CHOL 159 07/03/2021   TRIG 131 07/03/2021   HDL 53 07/03/2021   LDLCALC 83 07/03/2021   ALT 25 07/03/2021   AST 24 07/03/2021   NA 141 07/03/2021   K 4.4 07/03/2021   CL 99 07/03/2021   CREATININE 0.90 07/03/2021   BUN 17 07/03/2021   CO2 27 07/03/2021   TSH 2.539 03/22/2019   INR 1.4 (H) 12/24/2020   HGBA1C 6.3 (H) 07/03/2021      Assessment & Plan:   1. Chronic atrial fibrillation (HCC) The current medical regimen is effective;  continue present plan and medications.  2. Acquired thrombophilia ([place]) On eliquis for atrial fibrillation.  3. 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HCC) Control: good Recommend check feet daily. Recommend annual eye exams. Medicines: none. Continue to work on eating a healthy diet and exercise.  Labs drawn today.    4. COPD GOLD  III  The current medical regimen is effective;  continue present plan and medications. Improved. On lower dose of prednisone 5 mg once daily.   5. Chronic respiratory failure with hypoxia (HCC) ON oxygen at night.   6. Essential hypertension The current medical regimen is effective;  continue present plan and medications. Labs reviewed.   7. Oxygen dependent  8. Other iron deficiency anemia - Iron, TIBC and Ferritin Panel   9. Osteoporosis Continue prolia. Check to be sure calcium supplement is calcium citrate with D 2 daily.   10. Hyperlipidemia The current medical regimen is effective;  continue present plan and medications.  Orders Placed This Encounter  Procedures   Iron, TIBC and Ferritin Panel    Follow-up: No follow-ups on file.  An After Visit Summary was printed and given to the patient.  [name], MD [name] [phone]

## 2021-07-05 NOTE — Patient Instructions (Signed)
Check to be sure calcium supplement is calcium citrate with D 2 daily.

## 2021-07-06 ENCOUNTER — Encounter: Payer: Self-pay | Admitting: Cardiology

## 2021-07-06 ENCOUNTER — Ambulatory Visit (INDEPENDENT_AMBULATORY_CARE_PROVIDER_SITE_OTHER): Payer: Medicare Other | Admitting: Cardiology

## 2021-07-06 VITALS — BP 136/68 | HR 81 | Ht 66.0 in | Wt 153.8 lb

## 2021-07-06 DIAGNOSIS — J438 Other emphysema: Secondary | ICD-10-CM

## 2021-07-06 DIAGNOSIS — I10 Essential (primary) hypertension: Secondary | ICD-10-CM

## 2021-07-06 DIAGNOSIS — I480 Paroxysmal atrial fibrillation: Secondary | ICD-10-CM | POA: Diagnosis not present

## 2021-07-06 DIAGNOSIS — I950 Idiopathic hypotension: Secondary | ICD-10-CM

## 2021-07-06 DIAGNOSIS — I5032 Chronic diastolic (congestive) heart failure: Secondary | ICD-10-CM

## 2021-07-06 MED ORDER — IVABRADINE HCL 5 MG PO TABS: ORAL_TABLET | ORAL | 0 refills | Status: DC

## 2021-07-06 NOTE — Patient Instructions (Addendum)
Medication Instructions:  Your physician recommends that you continue on your current medications as directed. Please refer to the Current Medication list given to you today. *If you need a refill on your cardiac medications before your next appointment, please call your pharmacy*   Lab Work: None ordered. If you have labs (blood work) drawn today and your tests are completely normal, you will receive your results only by: [place] (if you have MyChart) OR A paper copy in the mail If you have any lab test that is abnormal or we need to change your treatment, we will call you to review the results.   Testing/Procedures: Your physician has requested that you have cardiac CT. Cardiac computed tomography (CT) is a painless test that uses an x-ray machine to take clear, detailed pictures of your heart.   Your physician has requested that you have an echocardiogram. Echocardiography is a painless test that uses sound waves to create images of your heart. It provides your doctor with information about the size and shape of your heart and how well your heart's chambers and valves are working. This procedure takes approximately one hour. There are no restrictions for this procedure.  Please schedule for ECHO    Follow-Up:   After your cardiac CT testing and echocardiogram are complete you will be contacted with further instructions.  If you have further questions about this procedure please call [phone].   Your next office visit will be with the afib clinic prior to the Watchman implant (if indicated):   AFIB CLINIC INFORMATION: Your appointment is scheduled on: _______ at __________. Please arrive 15 minutes early for check-in. The AFib Clinic is located in the [facility] Clin[facility] at [facility]. Parking instructions/directions: [address] (off [address]). When you pull in to Entrance C, there is an underground parking garage to your right. The code to  enter the garage is _______________. Take the elevators to the first floor. Follow the signs to the Heart and Vascular Specialty Clinics. You will see registration at the end of the hallway. Phone number: [phone]

## 2021-07-09 ENCOUNTER — Ambulatory Visit (HOSPITAL_COMMUNITY): Payer: Medicare Other | Attending: Cardiology

## 2021-07-09 ENCOUNTER — Other Ambulatory Visit: Payer: Self-pay

## 2021-07-09 DIAGNOSIS — I5032 Chronic diastolic (congestive) heart failure: Secondary | ICD-10-CM | POA: Diagnosis not present

## 2021-07-09 DIAGNOSIS — I480 Paroxysmal atrial fibrillation: Secondary | ICD-10-CM

## 2021-07-09 DIAGNOSIS — I950 Idiopathic hypotension: Secondary | ICD-10-CM | POA: Diagnosis not present

## 2021-07-09 DIAGNOSIS — I10 Essential (primary) hypertension: Secondary | ICD-10-CM

## 2021-07-09 LAB — ECHOCARDIOGRAM COMPLETE
Area-P 1/2: 3.85 cm2
S' Lateral: 2.7 cm

## 2021-07-13 ENCOUNTER — Telehealth: Payer: Self-pay | Admitting: Cardiology

## 2021-07-13 NOTE — Telephone Encounter (Signed)
Returned call to Pt.  Spoke with Pt's wife.  Advised per Dr. Markham Jordan. Melvyn Novas had cleared Pt for Watchman procedure.

## 2021-07-13 NOTE — Telephone Encounter (Signed)
    Pt's wife calling, she said they were told by Dr. Quentin Ore to call back after a week, she said to call her husband directly instead

## 2021-07-16 ENCOUNTER — Telehealth (HOSPITAL_COMMUNITY): Payer: Self-pay | Admitting: *Deleted

## 2021-07-16 NOTE — Telephone Encounter (Signed)
Reaching out to patient to offer assistance regarding upcoming cardiac imaging study; pt verbalizes understanding of appt date/time, parking situation and where to check in, pre-test NPO status and medications ordered; name and call back number provided for further questions should they arise  Gordy Clement RN Navigator Cardiac Imaging [facility] [phone] office [phone] cell  Patient to take '5mg'$  bisoprolol and '10mg'$  ivabradine 2 hours prior to cardiac CT scan. Patient denies any claustrophilia.

## 2021-07-17 ENCOUNTER — Other Ambulatory Visit: Payer: Self-pay

## 2021-07-17 ENCOUNTER — Ambulatory Visit (HOSPITAL_COMMUNITY)
Admission: RE | Admit: 2021-07-17 | Discharge: 2021-07-17 | Disposition: A | Discharge status: Home / Self Care | Payer: Medicare Other | Source: Ambulatory Visit | Attending: Cardiology | Admitting: Cardiology

## 2021-07-17 DIAGNOSIS — I10 Essential (primary) hypertension: Secondary | ICD-10-CM

## 2021-07-17 DIAGNOSIS — I950 Idiopathic hypotension: Secondary | ICD-10-CM | POA: Diagnosis not present

## 2021-07-17 DIAGNOSIS — I480 Paroxysmal atrial fibrillation: Secondary | ICD-10-CM | POA: Diagnosis not present

## 2021-07-17 DIAGNOSIS — I5032 Chronic diastolic (congestive) heart failure: Secondary | ICD-10-CM | POA: Diagnosis not present

## 2021-07-17 MED ORDER — IOHEXOL 350 MG/ML SOLN
100.0000 mL | Freq: Once | INTRAVENOUS | Status: AC | PRN
  Administered 2021-07-17: 100 mL via INTRAVENOUS

## 2021-07-20 ENCOUNTER — Telehealth: Payer: Self-pay | Admitting: Cardiology

## 2021-07-20 NOTE — Telephone Encounter (Signed)
Returned call to Paul [name]. Informed her that imaging has been uploaded to industry to review and they will be called with an update/further planning next week.  She was grateful for call back.

## 2021-07-20 NOTE — Telephone Encounter (Signed)
Called in to get results. Please advise

## 2021-07-22 DIAGNOSIS — E119 Type 2 diabetes mellitus without complications: Secondary | ICD-10-CM | POA: Diagnosis not present

## 2021-07-22 DIAGNOSIS — I480 Paroxysmal atrial fibrillation: Secondary | ICD-10-CM | POA: Diagnosis not present

## 2021-07-22 DIAGNOSIS — J449 Chronic obstructive pulmonary disease, unspecified: Secondary | ICD-10-CM | POA: Diagnosis not present

## 2021-07-22 DIAGNOSIS — I10 Essential (primary) hypertension: Secondary | ICD-10-CM | POA: Diagnosis not present

## 2021-07-23 NOTE — Telephone Encounter (Signed)
Spoke with the patient about echo results and informed him Industry and Dr. Quentin Ore have reviewed CT imaging for sizing.  He requests to proceed with Watchman implantation on 08/02/2021.  He understands he will get his instructions via MyChart in the next few days and will be called next week to review/confirm.  He was grateful for call and agrees with plan.

## 2021-07-24 ENCOUNTER — Other Ambulatory Visit: Payer: Self-pay

## 2021-07-24 DIAGNOSIS — R319 Hematuria, unspecified: Secondary | ICD-10-CM

## 2021-07-24 DIAGNOSIS — I480 Paroxysmal atrial fibrillation: Secondary | ICD-10-CM

## 2021-07-30 ENCOUNTER — Telehealth: Payer: Self-pay

## 2021-07-30 NOTE — Telephone Encounter (Signed)
Confirmed with the patient he received and reviewed MyChart message with instructions for Covid test and Watchman procedure. Confirmed he will have Covid test tomorrow. Reviewed procedure instructions and he has no questions or concerns at this time. He understands to call prior to procedure if any questions arise. He was grateful for call and agrees with plan.

## 2021-07-31 ENCOUNTER — Other Ambulatory Visit: Payer: Self-pay | Admitting: Cardiovascular Disease

## 2021-07-31 LAB — SARS CORONAVIRUS 2 (TAT 6-24 HRS): SARS Coronavirus 2: NEGATIVE

## 2021-08-02 ENCOUNTER — Inpatient Hospital Stay (HOSPITAL_COMMUNITY): Payer: Medicare Other | Admitting: Certified Registered Nurse Anesthetist

## 2021-08-02 ENCOUNTER — Inpatient Hospital Stay (HOSPITAL_COMMUNITY): Payer: Medicare Other

## 2021-08-02 ENCOUNTER — Inpatient Hospital Stay (HOSPITAL_COMMUNITY)
Admission: RE | Admit: 2021-08-02 | Discharge: 2021-08-03 | DRG: 274 | Disposition: A | Discharge status: Home / Self Care | Payer: Medicare Other | Attending: Cardiology | Admitting: Cardiology

## 2021-08-02 ENCOUNTER — Other Ambulatory Visit: Payer: Self-pay

## 2021-08-02 ENCOUNTER — Encounter (HOSPITAL_COMMUNITY)
Admission: RE | Disposition: A | Discharge status: Home / Self Care | Payer: Self-pay | Source: Home / Self Care | Attending: Cardiology

## 2021-08-02 ENCOUNTER — Encounter (HOSPITAL_COMMUNITY): Payer: Self-pay | Admitting: Cardiology

## 2021-08-02 DIAGNOSIS — Z9181 History of falling: Secondary | ICD-10-CM | POA: Diagnosis not present

## 2021-08-02 DIAGNOSIS — D649 Anemia, unspecified: Secondary | ICD-10-CM | POA: Diagnosis present

## 2021-08-02 DIAGNOSIS — J449 Chronic obstructive pulmonary disease, unspecified: Secondary | ICD-10-CM | POA: Diagnosis present

## 2021-08-02 DIAGNOSIS — Z881 Allergy status to other antibiotic agents status: Secondary | ICD-10-CM

## 2021-08-02 DIAGNOSIS — K219 Gastro-esophageal reflux disease without esophagitis: Secondary | ICD-10-CM | POA: Diagnosis present

## 2021-08-02 DIAGNOSIS — I4891 Unspecified atrial fibrillation: Secondary | ICD-10-CM | POA: Diagnosis not present

## 2021-08-02 DIAGNOSIS — F411 Generalized anxiety disorder: Secondary | ICD-10-CM | POA: Diagnosis present

## 2021-08-02 DIAGNOSIS — Z006 Encounter for examination for normal comparison and control in clinical research program: Secondary | ICD-10-CM

## 2021-08-02 DIAGNOSIS — Z01818 Encounter for other preprocedural examination: Secondary | ICD-10-CM | POA: Diagnosis not present

## 2021-08-02 DIAGNOSIS — E119 Type 2 diabetes mellitus without complications: Secondary | ICD-10-CM | POA: Diagnosis present

## 2021-08-02 DIAGNOSIS — E7800 Pure hypercholesterolemia, unspecified: Secondary | ICD-10-CM | POA: Diagnosis present

## 2021-08-02 DIAGNOSIS — Z87891 Personal history of nicotine dependence: Secondary | ICD-10-CM | POA: Diagnosis not present

## 2021-08-02 DIAGNOSIS — R319 Hematuria, unspecified: Secondary | ICD-10-CM

## 2021-08-02 DIAGNOSIS — I700 Atherosclerosis of aorta: Secondary | ICD-10-CM | POA: Diagnosis present

## 2021-08-02 DIAGNOSIS — Z7951 Long term (current) use of inhaled steroids: Secondary | ICD-10-CM

## 2021-08-02 DIAGNOSIS — Z79899 Other long term (current) drug therapy: Secondary | ICD-10-CM

## 2021-08-02 DIAGNOSIS — I480 Paroxysmal atrial fibrillation: Secondary | ICD-10-CM | POA: Diagnosis present

## 2021-08-02 DIAGNOSIS — Z888 Allergy status to other drugs, medicaments and biological substances status: Secondary | ICD-10-CM

## 2021-08-02 DIAGNOSIS — Z95818 Presence of other cardiac implants and grafts: Secondary | ICD-10-CM

## 2021-08-02 DIAGNOSIS — Z8249 Family history of ischemic heart disease and other diseases of the circulatory system: Secondary | ICD-10-CM

## 2021-08-02 DIAGNOSIS — J439 Emphysema, unspecified: Secondary | ICD-10-CM | POA: Diagnosis not present

## 2021-08-02 DIAGNOSIS — I5032 Chronic diastolic (congestive) heart failure: Secondary | ICD-10-CM | POA: Diagnosis present

## 2021-08-02 DIAGNOSIS — Z7952 Long term (current) use of systemic steroids: Secondary | ICD-10-CM

## 2021-08-02 DIAGNOSIS — J441 Chronic obstructive pulmonary disease with (acute) exacerbation: Secondary | ICD-10-CM | POA: Diagnosis not present

## 2021-08-02 DIAGNOSIS — Z8616 Personal history of COVID-19: Secondary | ICD-10-CM

## 2021-08-02 DIAGNOSIS — I950 Idiopathic hypotension: Secondary | ICD-10-CM | POA: Diagnosis present

## 2021-08-02 DIAGNOSIS — Z7901 Long term (current) use of anticoagulants: Secondary | ICD-10-CM

## 2021-08-02 DIAGNOSIS — M810 Age-related osteoporosis without current pathological fracture: Secondary | ICD-10-CM | POA: Diagnosis present

## 2021-08-02 DIAGNOSIS — I110 Hypertensive heart disease with heart failure: Secondary | ICD-10-CM | POA: Diagnosis present

## 2021-08-02 HISTORY — PX: LEFT ATRIAL APPENDAGE OCCLUSION: EP1229

## 2021-08-02 HISTORY — DX: Gastro-esophageal reflux disease without esophagitis: K21.9

## 2021-08-02 HISTORY — DX: Pneumonia, unspecified organism: J18.9

## 2021-08-02 HISTORY — DX: Presence of other cardiac implants and grafts: Z95.818

## 2021-08-02 HISTORY — PX: TEE WITHOUT CARDIOVERSION: SHX5443

## 2021-08-02 LAB — TYPE AND SCREEN
ABO/RH(D): B POS
Antibody Screen: NEGATIVE

## 2021-08-02 LAB — GLUCOSE, CAPILLARY: Glucose-Capillary: 108 mg/dL — ABNORMAL HIGH (ref 70–99)

## 2021-08-02 LAB — SURGICAL PCR SCREEN
MRSA, PCR: NEGATIVE
Staphylococcus aureus: NEGATIVE

## 2021-08-02 LAB — ABO/RH: ABO/RH(D): B POS

## 2021-08-02 SURGERY — LEFT ATRIAL APPENDAGE OCCLUSION
Anesthesia: General

## 2021-08-02 MED ORDER — HEPARIN (PORCINE) IN NACL 2000-0.9 UNIT/L-% IV SOLN
INTRAVENOUS | Status: DC | PRN
  Administered 2021-08-02 (×2): 1000 mL

## 2021-08-02 MED ORDER — PROPOFOL 10 MG/ML IV BOLUS
INTRAVENOUS | Status: DC | PRN
  Administered 2021-08-02: 130 mg via INTRAVENOUS

## 2021-08-02 MED ORDER — SODIUM CHLORIDE 0.9 % IV SOLN: 250.0000 mL | INTRAVENOUS | Status: DC | PRN

## 2021-08-02 MED ORDER — CEFAZOLIN SODIUM-DEXTROSE 2-4 GM/100ML-% IV SOLN
2.0000 g | INTRAVENOUS | Status: AC
  Administered 2021-08-02: 2 g via INTRAVENOUS

## 2021-08-02 MED ORDER — DEXAMETHASONE SODIUM PHOSPHATE 10 MG/ML IJ SOLN
INTRAMUSCULAR | Status: DC | PRN
  Administered 2021-08-02: 5 mg via INTRAVENOUS

## 2021-08-02 MED ORDER — ACETAMINOPHEN 325 MG PO TABS: 650.0000 mg | ORAL_TABLET | ORAL | Status: DC | PRN

## 2021-08-02 MED ORDER — UMECLIDINIUM BROMIDE 62.5 MCG/INH IN AEPB
1.0000 | INHALATION_SPRAY | Freq: Every day | RESPIRATORY_TRACT | Status: DC
  Filled 2021-08-02: qty 7

## 2021-08-02 MED ORDER — LACTATED RINGERS IV SOLN: INTRAVENOUS | Status: DC

## 2021-08-02 MED ORDER — PROTAMINE SULFATE 10 MG/ML IV SOLN
INTRAVENOUS | Status: DC | PRN
  Administered 2021-08-02: 20 mg via INTRAVENOUS
  Administered 2021-08-02: 10 mg via INTRAVENOUS

## 2021-08-02 MED ORDER — SODIUM CHLORIDE 0.9 % IV SOLN: INTRAVENOUS | Status: DC

## 2021-08-02 MED ORDER — ATORVASTATIN CALCIUM 40 MG PO TABS
40.0000 mg | ORAL_TABLET | Freq: Every day | ORAL | Status: DC
  Administered 2021-08-02: 40 mg via ORAL
  Filled 2021-08-02: qty 1

## 2021-08-02 MED ORDER — LACTATED RINGERS IV SOLN: INTRAVENOUS | Status: DC | PRN

## 2021-08-02 MED ORDER — ROCURONIUM BROMIDE 10 MG/ML (PF) SYRINGE
PREFILLED_SYRINGE | INTRAVENOUS | Status: DC | PRN
  Administered 2021-08-02: 50 mg via INTRAVENOUS

## 2021-08-02 MED ORDER — ALBUTEROL SULFATE HFA 108 (90 BASE) MCG/ACT IN AERS: 2.0000 | INHALATION_SPRAY | Freq: Four times a day (QID) | RESPIRATORY_TRACT | Status: DC | PRN

## 2021-08-02 MED ORDER — FERROUS SULFATE 325 (65 FE) MG PO TABS
325.0000 mg | ORAL_TABLET | Freq: Two times a day (BID) | ORAL | Status: DC
  Administered 2021-08-02 – 2021-08-03 (×2): 325 mg via ORAL
  Filled 2021-08-02 (×2): qty 1

## 2021-08-02 MED ORDER — CEFAZOLIN SODIUM-DEXTROSE 2-4 GM/100ML-% IV SOLN
INTRAVENOUS | Status: AC
  Filled 2021-08-02: qty 100

## 2021-08-02 MED ORDER — SODIUM CHLORIDE 0.9% FLUSH: 3.0000 mL | INTRAVENOUS | Status: DC | PRN

## 2021-08-02 MED ORDER — SERTRALINE HCL 25 MG PO TABS
50.0000 mg | ORAL_TABLET | Freq: Every day | ORAL | Status: DC
  Administered 2021-08-03: 50 mg via ORAL
  Filled 2021-08-02: qty 2

## 2021-08-02 MED ORDER — BUDESON-GLYCOPYRROL-FORMOTEROL 160-9-4.8 MCG/ACT IN AERO: 2.0000 | INHALATION_SPRAY | Freq: Two times a day (BID) | RESPIRATORY_TRACT | Status: DC

## 2021-08-02 MED ORDER — PHENYLEPHRINE HCL-NACL 20-0.9 MG/250ML-% IV SOLN
INTRAVENOUS | Status: DC | PRN
  Administered 2021-08-02: 25 ug/min via INTRAVENOUS

## 2021-08-02 MED ORDER — UMECLIDINIUM BROMIDE 62.5 MCG/INH IN AEPB
1.0000 | INHALATION_SPRAY | Freq: Every day | RESPIRATORY_TRACT | Status: DC
  Administered 2021-08-02: 1 via RESPIRATORY_TRACT
  Filled 2021-08-02: qty 7

## 2021-08-02 MED ORDER — ONDANSETRON HCL 4 MG/2ML IJ SOLN: 4.0000 mg | Freq: Four times a day (QID) | INTRAMUSCULAR | Status: DC | PRN

## 2021-08-02 MED ORDER — FUROSEMIDE 40 MG PO TABS
40.0000 mg | ORAL_TABLET | Freq: Every day | ORAL | Status: DC
  Administered 2021-08-03: 40 mg via ORAL
  Filled 2021-08-02: qty 1

## 2021-08-02 MED ORDER — PREDNISONE 10 MG PO TABS
5.0000 mg | ORAL_TABLET | Freq: Every day | ORAL | Status: DC
  Administered 2021-08-03: 5 mg via ORAL
  Filled 2021-08-02: qty 1

## 2021-08-02 MED ORDER — PHENYLEPHRINE 40 MCG/ML (10ML) SYRINGE FOR IV PUSH (FOR BLOOD PRESSURE SUPPORT)
PREFILLED_SYRINGE | INTRAVENOUS | Status: DC | PRN
  Administered 2021-08-02 (×2): 40 ug via INTRAVENOUS

## 2021-08-02 MED ORDER — BISOPROLOL FUMARATE 5 MG PO TABS
5.0000 mg | ORAL_TABLET | Freq: Every day | ORAL | Status: DC
  Administered 2021-08-03: 5 mg via ORAL
  Filled 2021-08-02: qty 1

## 2021-08-02 MED ORDER — ACETAMINOPHEN 500 MG PO TABS
1000.0000 mg | ORAL_TABLET | Freq: Once | ORAL | Status: DC
  Filled 2021-08-02: qty 2

## 2021-08-02 MED ORDER — APIXABAN 5 MG PO TABS
5.0000 mg | ORAL_TABLET | Freq: Two times a day (BID) | ORAL | Status: DC
  Administered 2021-08-02 – 2021-08-03 (×2): 5 mg via ORAL
  Filled 2021-08-02 (×2): qty 1

## 2021-08-02 MED ORDER — SODIUM CHLORIDE 0.45 % IV SOLN: INTRAVENOUS | Status: DC

## 2021-08-02 MED ORDER — LIDOCAINE 2% (20 MG/ML) 5 ML SYRINGE
INTRAMUSCULAR | Status: DC | PRN
  Administered 2021-08-02: 60 mg via INTRAVENOUS

## 2021-08-02 MED ORDER — ASPIRIN 81 MG PO CHEW
81.0000 mg | CHEWABLE_TABLET | Freq: Every day | ORAL | Status: DC
  Administered 2021-08-03: 81 mg via ORAL
  Filled 2021-08-02: qty 1

## 2021-08-02 MED ORDER — FLUTICASONE FUROATE-VILANTEROL 200-25 MCG/INH IN AEPB
1.0000 | INHALATION_SPRAY | Freq: Every day | RESPIRATORY_TRACT | Status: DC
  Administered 2021-08-02: 1 via RESPIRATORY_TRACT
  Filled 2021-08-02: qty 28

## 2021-08-02 MED ORDER — SUGAMMADEX SODIUM 200 MG/2ML IV SOLN
INTRAVENOUS | Status: DC | PRN
  Administered 2021-08-02: 200 mg via INTRAVENOUS

## 2021-08-02 MED ORDER — IOHEXOL 350 MG/ML SOLN
INTRAVENOUS | Status: DC | PRN
  Administered 2021-08-02: 5 mL

## 2021-08-02 MED ORDER — ALBUTEROL SULFATE (2.5 MG/3ML) 0.083% IN NEBU: 2.5000 mg | INHALATION_SOLUTION | Freq: Four times a day (QID) | RESPIRATORY_TRACT | Status: DC | PRN

## 2021-08-02 MED ORDER — FENTANYL CITRATE (PF) 100 MCG/2ML IJ SOLN
INTRAMUSCULAR | Status: DC | PRN
  Administered 2021-08-02 (×2): 50 ug via INTRAVENOUS

## 2021-08-02 MED ORDER — HEPARIN (PORCINE) IN NACL 1000-0.9 UT/500ML-% IV SOLN
INTRAVENOUS | Status: DC | PRN
  Administered 2021-08-02: 500 mL

## 2021-08-02 MED ORDER — CHLORHEXIDINE GLUCONATE 0.12 % MT SOLN
OROMUCOSAL | Status: AC
  Administered 2021-08-02: 15 mL
  Filled 2021-08-02: qty 15

## 2021-08-02 MED ORDER — HEPARIN SODIUM (PORCINE) 1000 UNIT/ML IJ SOLN
INTRAMUSCULAR | Status: DC | PRN
  Administered 2021-08-02: 2000 [IU] via INTRAVENOUS
  Administered 2021-08-02: 11000 [IU] via INTRAVENOUS

## 2021-08-02 MED ORDER — ONDANSETRON HCL 4 MG/2ML IJ SOLN
INTRAMUSCULAR | Status: DC | PRN
  Administered 2021-08-02: 4 mg via INTRAVENOUS

## 2021-08-02 MED ORDER — SODIUM CHLORIDE 0.9% FLUSH
3.0000 mL | Freq: Two times a day (BID) | INTRAVENOUS | Status: DC
  Administered 2021-08-02 (×2): 3 mL via INTRAVENOUS

## 2021-08-02 MED ORDER — FLUTICASONE FUROATE-VILANTEROL 200-25 MCG/INH IN AEPB
1.0000 | INHALATION_SPRAY | Freq: Every day | RESPIRATORY_TRACT | Status: DC
  Filled 2021-08-02: qty 28

## 2021-08-02 SURGICAL SUPPLY — 20 items
BAG SNAP BAND KOVER 36X36 (MISCELLANEOUS) ×1 IMPLANT
CATH INFINITI 5FR ANG PIGTAIL (CATHETERS) ×1 IMPLANT
CLOSURE PERCLOSE PROSTYLE (VASCULAR PRODUCTS) ×2 IMPLANT
DEVICE WATCHMAN FLX PROC (KITS) IMPLANT
DILATOR VESSEL 38 20CM 12FR (INTRODUCER) ×1 IMPLANT
DILATOR VESSEL 38 20CM 16FR (INTRODUCER) ×1 IMPLANT
KIT HEART LEFT (KITS) ×1 IMPLANT
KIT VERSACROSS LRG ACCESS (CATHETERS) ×1 IMPLANT
MAT PREVALON FULL STRYKER (MISCELLANEOUS) ×1 IMPLANT
PACK CARDIAC CATHETERIZATION (CUSTOM PROCEDURE TRAY) ×1 IMPLANT
PAD PRO RADIOLUCENT 2001M-C (PAD) ×1 IMPLANT
SHEATH PERFORMER 16FR 30 (SHEATH) ×1 IMPLANT
SHEATH PINNACLE 8F 10CM (SHEATH) ×1 IMPLANT
SHEATH PROBE COVER 6X72 (BAG) ×1 IMPLANT
TUBING CIL FLEX 10 FLL-RA (TUBING) ×1 IMPLANT
WATCHMAN FLX 20 (Prosthesis & Implant Heart) ×1 IMPLANT
WATCHMAN FLX PROCEDURE DEVICE (KITS) ×2 IMPLANT
WATCHMAN PROCED TRUSEAL ACCESS (SHEATH) ×1 IMPLANT
WATCHMAN TRUSEAL ANTER CURVE (SHEATH) ×1 IMPLANT
WATCHMAN TRUSEAL DOUBLE CURVE (SHEATH) ×1 IMPLANT

## 2021-08-02 NOTE — Anesthesia Procedure Notes (Signed)
Procedure Name: Intubation Date/Time: 08/02/2021 10:03 AM Performed by: Harden Mo, CRNA Pre-anesthesia Checklist: Patient identified, Emergency Drugs available, Suction available and Patient being monitored Patient Re-evaluated:Patient Re-evaluated prior to induction Oxygen Delivery Method: Circle System Utilized Preoxygenation: Pre-oxygenation with 100% oxygen Induction Type: IV induction Ventilation: Mask ventilation without difficulty and Oral airway inserted - appropriate to patient size Laryngoscope Size: [name] Heck and 2 Grade View: Grade I Tube type: Oral Tube size: 7.5 mm Number of attempts: 1 Airway Equipment and Method: Stylet and Oral airway Placement Confirmation: ETT inserted through vocal cords under direct vision, positive ETCO2 and breath sounds checked- equal and bilateral Secured at: 22 cm Tube secured with: Tape Dental Injury: Teeth and Oropharynx as per pre-operative assessment

## 2021-08-02 NOTE — Transfer of Care (Signed)
Immediate Anesthesia Transfer of Care Note  Patient: Paul [name]  Procedure(s) Performed: LEFT ATRIAL APPENDAGE OCCLUSION TRANSESOPHAGEAL ECHOCARDIOGRAM (TEE)  Patient Location: Cath Lab  Anesthesia Type:General  Level of Consciousness: awake and alert   Airway & Oxygen Therapy: Patient Spontanous Breathing and Patient connected to nasal cannula oxygen  Post-op Assessment: Report given to RN, Post -op Vital signs reviewed and stable and Patient moving all extremities X 4  Post vital signs: Reviewed and stable  Last Vitals:  Vitals Value Taken Time  BP    Temp    Pulse    Resp    SpO2      Last Pain:  Vitals:   08/02/21 0826  TempSrc:   PainSc: 0-No pain         Complications: No notable events documented.

## 2021-08-02 NOTE — TOC Initial Note (Signed)
Transition of Care [facility]) - Initial/Assessment Note    Patient Details  Name: Paul [name] MRN: [id number] Date of Birth: 1946-05-06  [facility]) CM/SW Contact:    Verdell Carmine, RN Phone Number: 08/02/2021, 2:35 PM  Clinical Narrative:                  Admitted for Afib, TEE< had COVID and has had several admissions , wears oxygen at night, has returned to active lifestyle, golfing, exercising. Here for TEE and atrial fibrillation. No needs currently identified. .  Expected Discharge Plan: Home/Self Care Barriers to Discharge: Continued Medical Work up   Patient Goals and CMS Choice        Expected Discharge Plan and Services Expected Discharge Plan: Home/Self Care   Discharge Planning Services: CM Consult Post Acute Care Choice: Durable Medical Equipment (oxygen at night) Living arrangements for the past 2 months: Single Family Home                        CM will follow for transistions and needs              Prior Living Arrangements/Services Living arrangements for the past 2 months: Single Family Home Lives with:: Spouse Patient language and need for interpreter reviewed:: Yes        Need for Family Participation in Patient Care: Yes (Comment) Care giver support system in place?: Yes (comment) Current home services: DME (oxygen at night) Criminal Activity/Legal Involvement Pertinent to Current Situation/Hospitalization: No - Comment as needed  Activities of Daily Living Home Assistive Devices/Equipment: Walker (specify type), Oxygen, Shower chair with back, Grab bars in shower, Eyeglasses ADL Screening (condition at time of admission) Patient's cognitive ability adequate to safely complete daily activities?: Yes Is the patient deaf or have difficulty hearing?: No Does the patient have difficulty seeing, even when wearing glasses/contacts?: No Does the patient have difficulty concentrating, remembering, or making decisions?: No Patient able  to express need for assistance with ADLs?: Yes Does the patient have difficulty dressing or bathing?: No Independently performs ADLs?: Yes (appropriate for developmental age) Does the patient have difficulty walking or climbing stairs?: Yes (due to COPD/shortness of breath) Weakness of Legs: None Weakness of Arms/Hands: None  Permission Sought/Granted                  Emotional Assessment       Orientation: : Oriented to Self, Oriented to Place, Oriented to  Time, Oriented to Situation Alcohol / Substance Use: Not Applicable Psych Involvement: No (comment)  Admission diagnosis:  Atrial fibrillation ([place]) [I48.91] Patient Active Problem List   Diagnosis Date Noted   Atrial fibrillation ([place]) 08/02/2021   Chronic respiratory failure with hypoxia ([place]) 01/16/2021   Anxiety 01/14/2021   Lingular pneumonia 01/05/2021   COVID-19 12/24/2020   COPD exacerbation ([place]) 12/09/2020   COPD with acute exacerbation ([place]) 12/08/2020   Physical deconditioning 04/12/2020   Syncope 04/01/2020   Chronic diastolic CHF (congestive heart failure) ([place]) 04/01/2020   Osteoporosis with current pathological fracture 02/06/2020   Essential hypertension 01/28/2020   Chronic bilateral thoracic back pain 01/25/2020   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[place]) 01/25/2020   HLD (hyperlipidemia) 01/05/2020   Vertebral compression fracture ([place]) 05/29/2019   Chronic anemia 05/29/2019   Pulmonary nodules 05/29/2019   Coronary artery calcification 04/06/2019   Atherosclerosis of aorta ([place]) 04/06/2019   Paroxysmal atrial fibrillation ([place])    Acute on chronic respiratory  failure with hypoxia ([place])    Lung nodule < 6cm on CT 05/29/2016   Former cigarette smoker 12/05/2015   COPD GOLD  III     PCP:  [name], MD Pharmacy:   [facility], [address] [place] [address] Phone: [phone] Fax: [phone]     Social  Determinants of Health (SDOH) Interventions    Readmission Risk Interventions Readmission Risk Prevention Plan 01/15/2021 04/05/2020 06/01/2019  Transportation Screening Complete Complete Complete  PCP or Specialist Appt within 3-5 Days - - Not Complete  Not Complete comments - - not ready for discharge   [place] or [place] - - Complete  Social Work Consult for [place] Planning/Counseling - - Complete  Palliative Care Screening - - Not Applicable  Medication Review Press photographer) Complete Complete Complete  PCP or Specialist appointment within 3-5 days of discharge Complete Complete -  [place] or Home Care Consult Complete Complete -  SW Recovery Care/Counseling Consult Complete Complete -  Palliative Care Screening Not Applicable Complete -  [place] Not Applicable Not Applicable -  Some recent data might be hidden

## 2021-08-02 NOTE — Anesthesia Preprocedure Evaluation (Signed)
Anesthesia Evaluation  Patient identified by MRN, date of birth, ID band Patient awake    Reviewed: Allergy & Precautions, NPO status , Patient's Chart, lab work & pertinent test results  Airway Mallampati: II  TM Distance: >3 FB     Dental  (+) Dental Advisory Given   Pulmonary COPD,  oxygen dependent, former smoker,    breath sounds clear to auscultation       Cardiovascular hypertension, Pt. on medications + CAD and +CHF  + dysrhythmias Atrial Fibrillation  Rhythm:Regular Rate:Normal     Neuro/Psych negative neurological ROS     GI/Hepatic Neg liver ROS, GERD  ,  Endo/Other  diabetes  Renal/GU negative Renal ROS     Musculoskeletal   Abdominal   Peds  Hematology  (+) anemia ,   Anesthesia Other Findings   Reproductive/Obstetrics                             Lab Results  Component Value Date   WBC 9.9 07/03/2021   HGB 12.4 (L) 07/03/2021   HCT 40.3 07/03/2021   MCV 79 07/03/2021   PLT 307 07/03/2021   Lab Results  Component Value Date   CREATININE 0.90 07/03/2021   BUN 17 07/03/2021   NA 141 07/03/2021   K 4.4 07/03/2021   CL 99 07/03/2021   CO2 27 07/03/2021    Anesthesia Physical Anesthesia Plan  ASA: 4  Anesthesia Plan: General   Post-op Pain Management:    Induction: Intravenous  PONV Risk Score and Plan: 2 and Dexamethasone, Ondansetron and Treatment may vary due to age or medical condition  Airway Management Planned: Oral ETT  Additional Equipment: Arterial line  Intra-op Plan:   Post-operative Plan: Possible Post-op intubation/ventilation and Extubation in OR  Informed Consent: I have reviewed the patients History and Physical, chart, labs and discussed the procedure including the risks, benefits and alternatives for the proposed anesthesia with the patient or authorized representative who has indicated his/her understanding and acceptance.      Dental advisory given  Plan Discussed with: CRNA  Anesthesia Plan Comments: (2IV's, a-line. GETA)        Anesthesia Quick Evaluation

## 2021-08-02 NOTE — Interval H&P Note (Signed)
History and Physical Interval Note:  08/02/2021 9:39 AM  Paul [name]  has presented today for surgery, with the diagnosis of afib.  The various methods of treatment have been discussed with the patient and family. After consideration of risks, benefits and other options for treatment, the patient has consented to  Procedure(s): LEFT ATRIAL APPENDAGE OCCLUSION (N/A) TRANSESOPHAGEAL ECHOCARDIOGRAM (TEE) (N/A) as a surgical intervention.  The patient's history has been reviewed, patient examined, no change in status, stable for surgery.  I have reviewed the patient's chart and labs.  Questions were answered to the patient's satisfaction.     Casondra Gasca T Hope Holst

## 2021-08-02 NOTE — Progress Notes (Signed)
    20 G, R radial arterial line was pulled, and manual pressure was held for 10 min. No hematoma was present. Capillary refill > 3 sec., and R radial pulse was 2+. Sterile dressing applied at the site.

## 2021-08-02 NOTE — Progress Notes (Signed)
Echocardiogram Echocardiogram Transesophageal has been performed.  Oneal Deputy Mavery Milling RDCS 08/02/2021, 12:03 PM

## 2021-08-02 NOTE — Plan of Care (Signed)

## 2021-08-02 NOTE — Progress Notes (Signed)
   Pt is currently enrolled in [facility], the home-based palliative care division of [facility] [place].  Pt receives home visits by SN and SW to provide he and his wife extra support with his chronic illness.  Will plan to resume [facility] services upon d/c back home.  Please call [facility] if we can be of any support with the d/c plan.  Thank you Wynetta Fines, RN Office [phone] Mobile [phone]

## 2021-08-02 NOTE — Anesthesia Procedure Notes (Signed)
Arterial Line Insertion Start/End8/10/2021 9:33 AM, 08/02/2021 9:33 AM Performed by: CRNA  Patient location: Pre-op. Preanesthetic checklist: patient identified, IV checked, site marked, risks and benefits discussed, surgical consent, monitors and equipment checked, pre-op evaluation, timeout performed and anesthesia consent Lidocaine 1% used for infiltration Right, radial was placed Catheter size: 20 G Hand hygiene performed , maximum sterile barriers used  and Seldinger technique used Allen's test indicative of satisfactory collateral circulation Attempts: 1 Procedure performed without using ultrasound guided technique. Following insertion, dressing applied and Biopatch. Post procedure assessment: normal  Patient tolerated the procedure well with no immediate complications.

## 2021-08-03 ENCOUNTER — Encounter: Payer: Self-pay | Admitting: Physician Assistant

## 2021-08-03 ENCOUNTER — Encounter (HOSPITAL_COMMUNITY): Payer: Self-pay | Admitting: Cardiology

## 2021-08-03 DIAGNOSIS — I480 Paroxysmal atrial fibrillation: Principal | ICD-10-CM

## 2021-08-03 LAB — BASIC METABOLIC PANEL
Anion gap: 7 (ref 5–15)
BUN: 15 mg/dL (ref 8–23)
CO2: 29 mmol/L (ref 22–32)
Calcium: 8.7 mg/dL — ABNORMAL LOW (ref 8.9–10.3)
Chloride: 102 mmol/L (ref 98–111)
Creatinine, Ser: 0.9 mg/dL (ref 0.61–1.24)
GFR, Estimated: >60 mL/min (ref >60)
Glucose, Bld: 106 mg/dL — ABNORMAL HIGH (ref 70–99)
Potassium: 4.6 mmol/L (ref 3.5–5.1)
Sodium: 138 mmol/L (ref 135–145)

## 2021-08-03 LAB — POCT ACTIVATED CLOTTING TIME: Activated Clotting Time: 277 seconds

## 2021-08-03 MED ORDER — ASPIRIN 81 MG PO CHEW: 81.0000 mg | CHEWABLE_TABLET | Freq: Every day | ORAL | Status: DC

## 2021-08-03 NOTE — Discharge Summary (Signed)
[facility]   MULTIDISCIPLINARY HEART VALVE TEAM   STRUCTURAL HEART PROCEDURE DISCHARGE SUMMARY   Patient ID: Paul [name],  MRN: [id number], DOB/AGE: Jun 01, 1946 75 y.o.  Admit date: 08/02/2021 Discharge date: 08/03/2021  Primary Care Physician: Paul Brome, MD  Primary Cardiologist: Paul Furbish, MD  Electrophysiologist: Paul Epley, MD  Primary Discharge Diagnosis:  Paroxysmal Atrial Fibrillation Poor candidacy for long term anticoagulation due to  severe bleeding 2/2 hematuria, falls and skin tears  Secondary Discharge Diagnosis:  HTN HLD GERD DMT2 COPD   Procedures This Admission:  Transeptal Puncture Intra-procedural TEE which showed no LAA thrombus Left atrial appendage occlusive device placement on 08/02/21 by Dr. Quentin [name].  This study demonstrated: IMPRESSIONS    1. Echo guided left atrial appendage closure. No LAA thrombus present.  The IAS was crossed in a mid and inferior location of the IAS. A 20 mm  Watchma FLX device was deployed with maximum diameter 16.8 mm (16%  compression). The device was well seated  without movement. No device leaks were present. The device remained stable  after deployment. A small pericardial effusion was present before the  case, and this did not change. A small residual iatrogenic ASD was present  due to transseptal puncture and  this demonstrated L to R shunting which was benign. No apparent  complications visualized. 3D post-processing confirmed no device leak and  optimal placement in the LAA.   2. Left ventricular ejection fraction, by estimation, is 60 to 65%. The  left ventricle has normal function. The left ventricle has no regional  wall motion abnormalities.   3. Right ventricular systolic function is normal. The right ventricular  size is normal.   4. No left atrial/left atrial appendage thrombus was detected.   5. A small pericardial effusion is present. The pericardial effusion is  circumferential.    6. The mitral valve is grossly normal. Trivial mitral valve  regurgitation. No evidence of mitral stenosis.   7. The aortic valve is tricuspid. Aortic valve regurgitation is not  visualized. No aortic stenosis is present.   8. There is mild (Grade II) layered plaque involving the descending aorta  and transverse aorta.     Brief HPI: Paul [name] is a 75 y.o. male with a history of Paroxysmal Atrial Fibrillation. The patient was seen as an outpatient and thought to be a poor candidate for continued anticoagulation due to  severe bleeding 2/2 falls, skin tears and hematuria . Risks, benefits, and alternatives to left atrial appendage occlusive device placement device were reviewed with the patient who wished to proceed.  The patient underwent intra-procedural TEE as above.   Hospital Course:  The patient was admitted and underwent Left Atrial appendage occlusive device placement with details as outlined above.  They were monitored on telemetry overnight which demonstrated sinus.  Groin was without complication on the day of discharge.  The patient was examined and considered to be stable for discharge.  Wound care and restrictions were reviewed with the patient.  The patient will be seen back by Paul Range, PA in 1 month and a repeat TEE in approximately 45 days post procedure to ensure proper seal of the device. They will follow up with Dr. Quentin [name] in 12 weeks for post closure follow up.   This patients CHA2DS2-VASc Score and unadjusted Ischemic Stroke Rate (% per year) is equal to 3.2 % stroke rate/year from a score of 3Above score calculated as 1 point each if present [CHF, HTN, DM, Vascular=MI/PAD/Aortic Plaque,  Age if 101-74, or Male]Above score calculated as 2 points each if present [Age > 75, or Stroke/TIA/TE]    The patient understands the importance of continuing on their Eliquis and a 81 mg ASA for 45 days. They understand that after 45 days, they will then take Plavix for a total of  6 months post procedure.    Physical Exam: Vitals:   08/02/21 1911 08/03/21 0020 08/03/21 0354 08/03/21 0757  BP: 116/61 105/65 (!) 123/57 (!) 102/45  Pulse: 82 73 83 80  Resp: '17 20 20 20  '$ Temp: 98.3 F (36.8 C) 98 F (36.7 C) 97.9 F (36.6 C) 97.8 F (36.6 C)  TempSrc: Oral Oral Oral   SpO2: 92% 96% 96% 94%  Weight:      Height:        GEN- The patient is well appearing, alert and oriented x 3 today.   HEENT: normocephalic, atraumatic; sclera clear, conjunctiva pink; hearing intact; oropharynx clear; neck supple  Lungs- Clear to ausculation bilaterally, normal work of breathing.  No wheezes, rales, rhonchi Heart- Regular rate and rhythm, no murmurs, rubs or gallops  GI- soft, non-tender, non-distended, bowel sounds present  Extremities- no clubbing, cyanosis, or edema; DP/PT/radial pulses 2+ bilaterally,  MS- no significant deformity or atrophy Skin- warm and dry, no rash or lesion Psych- euthymic mood, full affect Neuro- strength and sensation are intact   Labs:   Lab Results  Component Value Date   WBC 9.9 07/03/2021   HGB 12.4 (L) 07/03/2021   HCT 40.3 07/03/2021   MCV 79 07/03/2021   PLT 307 07/03/2021    Recent Labs  Lab 08/03/21 0024  NA 138  K 4.6  CL 102  CO2 29  BUN 15  CREATININE 0.90  CALCIUM 8.7*  GLUCOSE 106*     Discharge Medications:  Allergies as of 08/03/2021       Reactions   Daliresp [roflumilast] Diarrhea, Nausea And Vomiting   Alendronate Anxiety, Other (See Comments)   Jittery/nervous   Levaquin [levofloxacin] Palpitations, Other (See Comments)   Made the B/P fluctuate and heart raced        Medication List     STOP taking these medications    ivabradine 5 MG Tabs tablet Commonly known as: CORLANOR       TAKE these medications    ALPRAZolam 0.25 MG tablet Commonly known as: XANAX Take 1 tablet (0.25 mg total) by mouth 2 (two) times daily as needed for anxiety.   apixaban 5 MG Tabs tablet Commonly known as:  ELIQUIS Take 1 tablet (5 mg total) by mouth 2 (two) times daily.   vitamin C 1000 MG tablet Take 1,000 mg by mouth daily.   ascorbic acid 500 MG tablet Commonly known as: VITAMIN C Take 1 tablet (500 mg total) by mouth daily.   aspirin 81 MG chewable tablet Chew 1 tablet (81 mg total) by mouth daily. Okay to swallow whole Start taking on: August 04, 2021   atorvastatin 40 MG tablet Commonly known as: LIPITOR TAKE 1 TABLET(40 MG) BY MOUTH DAILY AFTER SUPPER   bisoprolol 5 MG tablet Commonly known as: ZEBETA Take 1 tablet (5 mg total) by mouth daily.   Breztri Aerosphere 160-9-4.8 MCG/ACT Aero Generic drug: Budeson-Glycopyrrol-Formoterol Inhale 2 puffs into the lungs 2 (two) times daily.   Breztri Aerosphere 160-9-4.8 MCG/ACT Aero Generic drug: Budeson-Glycopyrrol-Formoterol Inhale 2 puffs into the lungs in the morning and at bedtime.   calcium carbonate 600 MG Tabs tablet Commonly known as:  OS-CAL Take 600 mg by mouth daily.   denosumab 60 MG/ML Sosy injection Commonly known as: PROLIA Inject 60 mg into the skin every 6 (six) months.   diltiazem 60 MG tablet Commonly known as: Cardizem Take 1 tablet (60 mg total) by mouth 4 (four) times daily as needed.   ferrous sulfate 325 (65 FE) MG tablet Take 325 mg by mouth 2 (two) times daily with a meal.   furosemide 40 MG tablet Commonly known as: LASIX Take 1 tablet (40 mg total) by mouth daily.   guaiFENesin 600 MG 12 hr tablet Commonly known as: MUCINEX Take 600 mg by mouth daily.   loratadine 10 MG tablet Commonly known as: CLARITIN Take 10 mg by mouth in the morning.   multivitamin with minerals Tabs tablet Take 1 tablet by mouth daily.   NICOTINAMIDE PO Take 1 tablet by mouth in the morning and at bedtime.   OXYGEN Inhale 2-2.5 L/min into the lungs See admin instructions. Inhale 2 L/min of oxygen into the lungs at bedtime and when ambulatory   predniSONE 5 MG tablet Commonly known as: DELTASONE Take 5  mg by mouth daily with breakfast.   ProAir HFA 108 (90 Base) MCG/ACT inhaler Generic drug: albuterol Inhale 2 puffs into the lungs every 6 (six) hours as needed for wheezing or shortness of breath. What changed: Another medication with the same name was changed. Make sure you understand how and when to take each.   albuterol (2.5 MG/3ML) 0.083% nebulizer solution Commonly known as: PROVENTIL Take 3 mLs (2.5 mg total) by nebulization in the morning and at bedtime. What changed:  when to take this reasons to take this   sertraline 50 MG tablet Commonly known as: ZOLOFT Take 1 tablet by mouth once daily   sodium chloride 0.65 % nasal spray Commonly known as: OCEAN Place 1 spray into the nose as needed for congestion.   Vitamin D 50 MCG (2000 UT) Caps Take 2,000 Units by mouth daily.        Disposition:  home   Follow-up Information     Eileen Stanford, PA-C. Go on 09/06/2021.   Specialties: Cardiology, Radiology Why: @ 1:30pm, please arrive at least 10 minutes early Contact information: [place] [id number] [phone]                 Duration of Discharge Encounter: Greater than 30 minutes including physician time.  Signed, Angelena Form, PA-C  08/03/2021 9:47 AM

## 2021-08-03 NOTE — Plan of Care (Signed)
Problem: Education: Goal: Ability to demonstrate management of disease process will improve 08/03/2021 1011 by Leonie Man, RN Outcome: Adequate for Discharge 08/03/2021 0929 by Leonie Man, RN Outcome: Progressing Goal: Ability to verbalize understanding of medication therapies will improve 08/03/2021 1011 by Leonie Man, RN Outcome: Adequate for Discharge 08/03/2021 0929 by Leonie Man, RN Outcome: Progressing Goal: Individualized Educational Video(s) 08/03/2021 1011 by Leonie Man, RN Outcome: Adequate for Discharge 08/03/2021 0929 by Leonie Man, RN Outcome: Progressing   Problem: Activity: Goal: Capacity to carry out activities will improve 08/03/2021 1011 by Leonie Man, RN Outcome: Adequate for Discharge 08/03/2021 0929 by Leonie Man, RN Outcome: Progressing   Problem: Cardiac: Goal: Ability to achieve and maintain adequate cardiopulmonary perfusion will improve 08/03/2021 1011 by Leonie Man, RN Outcome: Adequate for Discharge 08/03/2021 0929 by Leonie Man, RN Outcome: Progressing   Problem: Education: Goal: Knowledge of General Education information will improve Description: Including pain rating scale, medication(s)/side effects and non-pharmacologic comfort measures 08/03/2021 1011 by Leonie Man, RN Outcome: Adequate for Discharge 08/03/2021 0929 by Leonie Man, RN Outcome: Progressing   Problem: Health Behavior/Discharge Planning: Goal: Ability to manage health-related needs will improve 08/03/2021 1011 by Leonie Man, RN Outcome: Adequate for Discharge 08/03/2021 0929 by Leonie Man, RN Outcome: Progressing   Problem: Clinical Measurements: Goal: Ability to maintain clinical measurements within normal limits will improve 08/03/2021 1011 by Leonie Man, RN Outcome: Adequate for Discharge 08/03/2021 0929 by Leonie Man, RN Outcome: Progressing Goal: Will remain free from infection 08/03/2021 1011 by Leonie Man, RN Outcome:  Adequate for Discharge 08/03/2021 0929 by Leonie Man, RN Outcome: Progressing Goal: Diagnostic test results will improve 08/03/2021 1011 by Leonie Man, RN Outcome: Adequate for Discharge 08/03/2021 0929 by Leonie Man, RN Outcome: Progressing Goal: Respiratory complications will improve 08/03/2021 1011 by Leonie Man, RN Outcome: Adequate for Discharge 08/03/2021 0929 by Leonie Man, RN Outcome: Progressing Goal: Cardiovascular complication will be avoided 08/03/2021 1011 by Leonie Man, RN Outcome: Adequate for Discharge 08/03/2021 0929 by Leonie Man, RN Outcome: Progressing   Problem: Activity: Goal: Risk for activity intolerance will decrease 08/03/2021 1011 by Leonie Man, RN Outcome: Adequate for Discharge 08/03/2021 0929 by Leonie Man, RN Outcome: Progressing   Problem: Nutrition: Goal: Adequate nutrition will be maintained 08/03/2021 1011 by Leonie Man, RN Outcome: Adequate for Discharge 08/03/2021 0929 by Leonie Man, RN Outcome: Progressing   Problem: Coping: Goal: Level of anxiety will decrease 08/03/2021 1011 by Leonie Man, RN Outcome: Adequate for Discharge 08/03/2021 0929 by Leonie Man, RN Outcome: Progressing   Problem: Elimination: Goal: Will not experience complications related to bowel motility 08/03/2021 1011 by Leonie Man, RN Outcome: Adequate for Discharge 08/03/2021 0929 by Leonie Man, RN Outcome: Progressing Goal: Will not experience complications related to urinary retention 08/03/2021 1011 by Leonie Man, RN Outcome: Adequate for Discharge 08/03/2021 0929 by Leonie Man, RN Outcome: Progressing   Problem: Pain Managment: Goal: General experience of comfort will improve 08/03/2021 1011 by Leonie Man, RN Outcome: Adequate for Discharge 08/03/2021 0929 by Leonie Man, RN Outcome: Progressing   Problem: Safety: Goal: Ability to remain free from injury will improve 08/03/2021 1011 by Leonie Man,  RN Outcome: Adequate for Discharge 08/03/2021 0929 by Leonie Man, RN Outcome: Progressing   Problem: Skin Integrity: Goal: Risk for impaired skin integrity  will decrease 08/03/2021 1011 by Leonie Man, RN Outcome: Adequate for Discharge 08/03/2021 0929 by Leonie Man, RN Outcome: Progressing

## 2021-08-03 NOTE — Discharge Instructions (Signed)
[facility] Procedure, Care After  Procedure MD: Dr. Benson Norway Clinical Coordinator: Lenice Llamas, RN  This sheet gives you information about how to care for yourself after your procedure. Your health care provider may also give you more specific instructions. If you have problems or questions, contact your health care provider.  What can I expect after the procedure? After the procedure, it is common to have: Bruising around your puncture site. Tenderness around your puncture site. Tiredness (fatigue).  Medication instructions It is very important to continue to take your blood thinner as directed by your doctor after the Watchman procedure. Call your procedure doctor's office with question or concerns. If you are on Coumadin (warfarin), you will have your INR checked the week after your procedure, with a goal INR of 2.0 - 3.0. Please follow your medication instructions on your discharge summary. Only take the medications listed on your discharge paperwork.  Follow up You will be seen in 1 month after your procedure in the Atrial Lutz will have another TEE (Transesophageal Echocardiogram) approximately 6 weeks after your procedure mark to check your device You will follow up the MD/APP who performed your procedure 6 months after your procedure The Watchman Clinical Coordinator will check in with you from time to time, including 1 and 2 years after your procedure.    Follow these instructions at home: Puncture site care  Follow instructions from your health care provider about how to take care of your puncture site. Make sure you: If present, leave stitches (sutures), skin glue, or adhesive strips in place.  If a large square bandage is present, this may be removed 24 hours after surgery.  Check your puncture site every day for signs of infection. Check for: Redness, swelling, or pain. Fluid or blood. If your puncture site starts to bleed, lie down on your back,  apply firm pressure to the area, and contact your health care provider. Warmth. Pus or a bad smell. Driving Do not drive yourself home if you received sedation Do not drive for at least 4 days after your procedure or however long your health care provider recommends. (Do not resume driving if you have previously been instructed not to drive for other health reasons.) Do not spend greater than 1 hour at a time in a car for the first 3 days. Stop and take a break with a 5 minute walk at least every hour.  Do not drive or use heavy machinery while taking prescription pain medicine.  Activity Avoid activities that take a lot of effort, including exercise, for at least 7 days after your procedure. For the first 3 days, avoid sitting for longer than one hour at a time.  Avoid alcoholic beverages, signing paperwork, or participating in legal proceedings for 24 hours after receiving sedation Do not lift anything that is heavier than 10 lb (4.5 kg) for one week.  No sexual activity for 1 week.  Return to your normal activities as told by your health care provider. Ask your health care provider what activities are safe for you. General instructions Take over-the-counter and prescription medicines only as told by your health care provider. Do not use any products that contain nicotine or tobacco, such as cigarettes and e-cigarettes. If you need help quitting, ask your health care provider. You may shower after 24 hours, but Do not take baths, swim, or use a hot tub for 1 week.  Do not drink alcohol for 24 hours after your procedure. Keep all follow-up  visits as told by your health care provider. This is important. Dental Work: You will require antibiotics prior to any dental work, including cleanings, for 6 months after your Watchman implantation to help protect you from infection. After 6 months, antibiotics are no longer required. Contact a health care provider if: You have redness, mild swelling, or  pain around your puncture site. You have soreness in your throat or at your puncture site that does not improve after several days You have fluid or blood coming from your puncture site that stops after applying firm pressure to the area. Your puncture site feels warm to the touch. You have pus or a bad smell coming from your puncture site. You have a fever. You have chest pain or discomfort that spreads to your neck, jaw, or arm. You are sweating a lot. You feel nauseous. You have a fast or irregular heartbeat. You have shortness of breath. You are dizzy or light-headed and feel the need to lie down. You have pain or numbness in the arm or leg closest to your puncture site. Get help right away if: Your puncture site suddenly swells. Your puncture site is bleeding and the bleeding does not stop after applying firm pressure to the area. These symptoms may represent a serious problem that is an emergency. Do not wait to see if the symptoms will go away. Get medical help right away. Call your local emergency services (911 in the [place] Do not drive yourself to the hospital. Summary After the procedure, it is normal to have bruising and tenderness at the puncture site in your groin, neck, or forearm. Check your puncture site every day for signs of infection. Get help right away if your puncture site is bleeding and the bleeding does not stop after applying firm pressure to the area. This is a medical emergency.  This information is not intended to replace advice given to you by your health care provider. Make sure you discuss any questions you have with your health care provider.

## 2021-08-03 NOTE — Anesthesia Postprocedure Evaluation (Signed)
Anesthesia Post Note  Patient: Paul [name]  Procedure(s) Performed: LEFT ATRIAL APPENDAGE OCCLUSION TRANSESOPHAGEAL ECHOCARDIOGRAM (TEE)     Patient location during evaluation: PACU Anesthesia Type: General Level of consciousness: awake and alert Pain management: pain level controlled Vital Signs Assessment: post-procedure vital signs reviewed and stable Respiratory status: spontaneous breathing, nonlabored ventilation, respiratory function stable and patient connected to nasal cannula oxygen Cardiovascular status: blood pressure returned to baseline and stable Postop Assessment: no apparent nausea or vomiting Anesthetic complications: no   No notable events documented.  Last Vitals:  Vitals:   08/03/21 0354 08/03/21 0757  BP: (!) 123/57 (!) 102/45  Pulse: 83 80  Resp: 20 20  Temp: 36.6 C 36.6 C  SpO2: 96% 94%    Last Pain:  Vitals:   08/03/21 0757  TempSrc:   PainSc: 0-No pain   Pain Goal:                   Tiajuana Amass

## 2021-08-03 NOTE — Plan of Care (Signed)

## 2021-08-05 NOTE — Op Note (Signed)
Watchman LAAO Procedure   Surgeon: Sherren Mocha, MD Co-Surgeon: Lars Mage, MD Imager: Marry Guan, MD   Procedure: 1) Transseptal Puncture 2) Watchman left atrial appendage occlusion 3) Perclose right femoral vein   Device Implant: Watchman #20   Background and Indication: 75 yo man with atrial fibrillation, COPD and hematuria felt to be a poor candidate for long term anticoagulation. He is referred for Watchman implantation.    Procedure description: US guidance is used for right femoral venous access. [place] images are stored digitally in the patient's chart. Double Preclose is performed at 10" and 2" positions using normal technique and a 16 Fr sheath is inserted. Transseptal puncture is then performed after fully anticoagulating the patient with unfractionated heparin. A Versacross system is used with RF energy to cross the interatrial septum under fluoroscopic and TEE guidance. Please see Dr Antionette Char detailed report for further description of transseptal puncture and Watchman Access Sheath insertion.   Once the 14 Fr access sheath is in appropriate position in the left atrial appendage, a 20 mm Watchman Flex device is inserted and deployed to obtain appropriate position and compression in the left atrial appendage. 1 recaptures are required. Thorough TEE imaging is performed to evaluate all PASS criteria prior to device release. Angiography is performed prior to device release. After device release, the device remains in stable position with complete occlusion of the appendage. The sheath is removed from the body and the previously deployed perclose sutures are tightened for complete hemostasis.    EBL: minimal   Pt is transferred to the recovery area following the procedure with no early apparent complications.     Lysbeth Galas T. Quentin Ore, MD, [facility] Cardiac Electrophysiology

## 2021-08-06 ENCOUNTER — Telehealth: Payer: Self-pay

## 2021-08-06 NOTE — Telephone Encounter (Signed)
  [facility]   Watchman Team  Contacted the patient regarding discharge from [facility] on 08/03/2021.  The patient understands to follow up with Nell Range on 09/06/2021 in preparation for his TEE on 09/13/2021.  The patient understands discharge instructions? Yes  The patient understands medications and regimen? Yes - he is taking Eliquis and aspirin as directed  The patient reports groin sites look healthy and healing - no s/s of infection or bleeding.  The patient understands to call with any questions or concerns prior to scheduled visit.   He was grateful for call.

## 2021-08-07 NOTE — Progress Notes (Addendum)
Chronic Care Management Pharmacy Note  08/09/2021 Name:  Paul [name] MRN:  [id number] DOB:  January 10, 1946   Plan Updates:  Watchman device - procedure went well. Patient reports that he should be off of Eliquis in 6 weeks.  Pharmacy team to coordinate patient assistance renewal.  Patient currently doing very well from respiratory standpoint and down to 5 mg daily of prednisone. Will be back to the gym next week once cleared after Watchman device. He hopes to play golf again once the temperature is cooler. Patient reports his wife will be having hip replacement in the next 2 months.   Subjective: Paul [name] is an 75 y.o. year old male who is a primary patient of Cox, Kirsten, MD.  The CCM team was consulted for assistance with disease management and care coordination needs.    Engaged with patient by telephone for follow up visit in response to provider referral for pharmacy case management and/or care coordination services.   Consent to Services:  The patient was given information about Chronic Care Management services, agreed to services, and gave verbal consent prior to initiation of services.  Please see initial visit note for detailed documentation.   Patient Care Team: [name], MD as PCP - General (Family Medicine) Jerline Pain, MD as PCP - Cardiology (Cardiology) Vickie Epley, MD as PCP - Electrophysiology (Cardiology) Elsie Stain, MD as Attending Physician (Pulmonary Disease) Tanda Rockers, MD as Consulting Physician (Pulmonary Disease) [place] Registered Nurse [facility] [place]) Burnice Logan, [facility] as Pharmacist (Pharmacist)  Recent office visits: 07/05/2021 - continue current medications. Make sure calcium is calcium citrate. Moderna booster.  05/10/2021 - Urgent referral to urology due to blood in urine. Hold Eliquis.  03/05/2021 - Prolia. Iron panel drawn.  Recent consult visits: 07/06/2021 - cardiology - consult for  watchman device.  05/29/2021 - pulmonlogy - try prednisone 10 mg alternating with 5 mg days. Refer for best fit for portable 02 concentrator.  05/17/2021 - cardiology - stop diltiazem due to orthostatic symptoms and dizziness. Continue bisoprolol 5 mg daily. Refer to [employer] device.  05/15/2021 - Apply xeroform, mepilex to the area. Use compression stocking. High protein diet. Discussed his steroid use again. Encouraged regular exercise. F/u 2 weeks call if healed.   05/01/2021 - wound care - dressing and compression with changing 3 times weekly. Recommend elevate legs. Follow-up in 2 weeks 04/17/2021 - wound care - dressing and compression with changing 3 times weekly. Recommend elevate legs. Follow-up in 2 weeks 04/03/2021 - wound care - dressing and compression with changing 3 times weekly. Recommend elevate legs. Follow-up in 2 weeks.  03/20/2021 - apply dressing and compression. Elevate legs. Changed three times weekly.  03/13/2021 - wound care - moisturize and mssage. Follow-up as needed.  02/27/2021 - wound care - change dressing three times weekly.  02/20/2021 - wound care - change dressing three times weekly.   02/19/2021 - pulmonology - Judithann Sauger replaced Symbicort and Spiriva.  Hospital visits:  03/21/2021 - ED for syncopal episodes. ZIO patch ordered and follow-up with cardiology.  Objective:  Lab Results  Component Value Date   CREATININE 0.90 08/03/2021   BUN 15 08/03/2021   GFRNONAA >60 08/03/2021   GFRAA 107 11/20/2020   NA 138 08/03/2021   K 4.6 08/03/2021   CALCIUM 8.7 (L) 08/03/2021   CO2 29 08/03/2021   GLUCOSE 106 (H) 08/03/2021    Lab Results  Component Value Date/Time   HGBA1C 6.3 (  H) 07/03/2021 08:12 AM   HGBA1C 7.0 (H) 03/01/2021 08:43 AM    Last diabetic Eye exam: No results found for: HMDIABEYEEXA  Last diabetic Foot exam: No results found for: HMDIABFOOTEX   Lab Results  Component Value Date   CHOL 159 07/03/2021   HDL 53 07/03/2021   LDLCALC 83  07/03/2021   TRIG 131 07/03/2021   CHOLHDL 3.0 07/03/2021    Hepatic Function Latest Ref Rng & Units 07/03/2021 05/10/2021 03/21/2021  Total Protein 6.0 - 8.5 g/dL 6.5 6.3 7.2  Albumin 3.7 - 4.7 g/dL 4.2 4.3 3.9  AST 0 - 40 IU/L 24 21 27   ALT 0 - 44 IU/L 25 20 27   Alk Phosphatase 44 - 121 IU/L 73 82 59  Total Bilirubin 0.0 - 1.2 mg/dL 0.2 <0.2 0.5  Bilirubin, Direct 0.0 - 0.2 mg/dL - - -    Lab Results  Component Value Date/Time   TSH 2.539 03/22/2019 06:21 AM    CBC Latest Ref Rng & Units 07/03/2021 05/10/2021 04/05/2021  WBC 3.4 - 10.8 x10E3/uL 9.9 14.4(H) 12.7(H)  Hemoglobin 13.0 - 17.7 g/dL 12.4(L) 12.2(L) 11.1(L)  Hematocrit 37.5 - 51.0 % 40.3 38.5 35.4(L)  Platelets 150 - 450 x10E3/uL 307 352 412    No results found for: VD25OH  Clinical ASCVD: No  The 10-year ASCVD risk score Mikey Bussing DC Jr., et al., 2013) is: 34.2%   Values used to calculate the score:     Age: 75 years     Sex: Male     Is Non-Hispanic African American: No     Diabetic: Yes     Tobacco smoker: No     Systolic Blood Pressure: 672 mmHg     Is BP treated: Yes     HDL Cholesterol: 53 mg/dL     Total Cholesterol: 159 mg/dL    Depression screen [facility] 2/9 07/05/2021 03/05/2021 02/08/2021  Decreased Interest 0 0 0  Down, Depressed, Hopeless 0 0 3  PHQ - 2 Score 0 0 3  Altered sleeping - 0 3  Tired, decreased energy - 0 3  Change in appetite - 0 0  Feeling bad or failure about yourself  - 0 3  Trouble concentrating - 0 0  Moving slowly or fidgety/restless - 0 3  Suicidal thoughts - 0 0  PHQ-9 Score - 0 15  Difficult doing work/chores - Not difficult at all Very difficult  Some recent data might be hidden     Social History   Tobacco Use  Smoking Status Former   Packs/day: 2.00   Years: 52.00   Pack years: 104.00   Types: Cigarettes   Quit date: 02/03/2009   Years since quitting: 12.5  Smokeless Tobacco Never  Tobacco Comments   Counseled to remain smoke free   BP Readings from Last 3  Encounters:  08/03/21 (!) 102/45  07/17/21 (!) 144/61  07/06/21 136/68   Pulse Readings from Last 3 Encounters:  08/03/21 80  07/17/21 71  07/06/21 81   Wt Readings from Last 3 Encounters:  08/02/21 151 lb (68.5 kg)  07/06/21 153 lb 12.8 oz (69.8 kg)  07/05/21 153 lb 9.6 oz (69.7 kg)   BMI Readings from Last 3 Encounters:  08/02/21 24.37 kg/m  07/06/21 24.82 kg/m  07/05/21 24.79 kg/m    Assessment/Interventions: Review of patient past medical history, allergies, medications, health status, including review of consultants reports, laboratory and other test data, was performed as part of comprehensive evaluation and provision of chronic care management  services.   SDOH:  (Social Determinants of Health) assessments and interventions performed: Yes  SDOH Screenings   Alcohol Screen: Low Risk    Last Alcohol Screening Score (AUDIT): 0  Depression (PHQ2-9): Low Risk    PHQ-2 Score: 0  Financial Resource Strain: Not on file  Food Insecurity: No Food Insecurity   Worried About Charity fundraiser in the Last Year: Never true   Ran Out of Food in the Last Year: Never true  Housing: Low Risk    Last Housing Risk Score: 0  Physical Activity: Not on file  Social Connections: Not on file  Stress: Not on file  Tobacco Use: Medium Risk   Smoking Tobacco Use: Former   Smokeless Tobacco Use: Never  Transportation Needs: No Transportation Needs   Lack of Transportation (Medical): No   Lack of Transportation (Non-Medical): No    CCM Care Plan  Allergies  Allergen Reactions   Daliresp [Roflumilast] Diarrhea and Nausea And Vomiting   Alendronate Anxiety and Other (See Comments)    Jittery/nervous   Levaquin [Levofloxacin] Palpitations and Other (See Comments)    Made the B/P fluctuate and heart raced    Medications Reviewed Today     Reviewed by Burnice Logan, [facility] (Pharmacist) on 08/09/21 at 1738  Med List Status: <None>   Medication Order Taking? Sig Documenting  Provider Last Dose Status Informant  albuterol (PROAIR HFA) 108 (90 Base) MCG/ACT inhaler [id number] Yes Inhale 2 puffs into the lungs every 6 (six) hours as needed for wheezing or shortness of breath.  [provider] Taking Active Spouse/Significant Other  albuterol (PROVENTIL) (2.5 MG/3ML) 0.083% nebulizer solution [id number] Yes Take 3 mLs (2.5 mg total) by nebulization in the morning and at bedtime. Parrett, Fonnie Mu, NP Taking Active   ALPRAZolam (XANAX) 0.25 MG tablet [id number] Yes Take 1 tablet (0.25 mg total) by mouth 2 (two) times daily as needed for anxiety. Cox, Kirsten, MD Taking Active Spouse/Significant Other  apixaban (ELIQUIS) 5 MG TABS tablet [id number] Yes Take 1 tablet (5 mg total) by mouth 2 (two) times daily. Jerline Pain, MD Taking Active Spouse/Significant Other  Ascorbic Acid (VITAMIN C) 1000 MG tablet [id number] Yes Take 1,000 mg by mouth daily. [provider] Taking Active Spouse/Significant Other  ascorbic acid (VITAMIN C) 500 MG tablet [id number] No Take 1 tablet (500 mg total) by mouth daily.  Patient not taking: Reported on 08/09/2021   Blain Pais, MD Not Taking Active Spouse/Significant Other  aspirin 81 MG chewable tablet [id number] Yes Chew 1 tablet (81 mg total) by mouth daily. Okay to swallow whole Eileen Stanford, PA-C Taking Active   atorvastatin (LIPITOR) 40 MG tablet [id number] Yes TAKE 1 TABLET(40 MG) BY MOUTH DAILY AFTER SUPPER Cox, Kirsten, MD Taking Active Spouse/Significant Other  bisoprolol (ZEBETA) 5 MG tablet [id number] Yes Take 1 tablet (5 mg total) by mouth daily. Jerline Pain, MD Taking Active Spouse/Significant Other  Budeson-Glycopyrrol-Formoterol (BREZTRI AEROSPHERE) 160-9-4.8 MCG/ACT Hollie Salk [id number] Yes Inhale 2 puffs into the lungs 2 (two) times daily. Cox, Kirsten, MD Taking Active Spouse/Significant Other  calcium carbonate (OS-CAL) 600 MG TABS tablet [id number] Yes Take 600 mg by mouth daily. [provider]  Taking Active Spouse/Significant Other  Cholecalciferol (VITAMIN D) 50 MCG (2000 UT) CAPS [id number] Yes Take 2,000 Units by mouth daily. [provider] Taking Active Spouse/Significant Other  denosumab (PROLIA) 60 MG/ML SOSY injection [id number] Yes Inject 60 mg into the skin every 6 (six) months. [provider] Taking  Active Spouse/Significant Other           Med Note Gar Ponto   Fri Jan 19, 2021  8:28 AM)    diltiazem (CARDIZEM) 60 MG tablet [id number] Yes Take 1 tablet (60 mg total) by mouth 4 (four) times daily as needed. Jerline Pain, MD Taking Active Spouse/Significant Other  ferrous sulfate 325 (65 FE) MG tablet [id number] Yes Take 325 mg by mouth 2 (two) times daily with a meal. [provider] Taking Active Spouse/Significant Other  furosemide (LASIX) 40 MG tablet [id number] Yes Take 1 tablet (40 mg total) by mouth daily. Jerline Pain, MD Taking Active Spouse/Significant Other  guaiFENesin (MUCINEX) 600 MG 12 hr tablet [id number] Yes Take 600 mg by mouth daily. [provider] Taking Active Spouse/Significant Other           Med Note (Alfonse Spruce, ANH T   Sat Apr 01, 2020  4:27 PM)    loratadine (CLARITIN) 10 MG tablet [id number] Yes Take 10 mg by mouth in the morning. [provider] Taking Active Spouse/Significant Other  Multiple Vitamin (MULTIVITAMIN WITH MINERALS) TABS tablet [id number] Yes Take 1 tablet by mouth daily. [provider] Taking Active Spouse/Significant Other  Niacinamide-Zn-Cu-Methfo-Se-Cr (NICOTINAMIDE PO) [id number] Yes Take 1 tablet by mouth in the morning and at bedtime. [provider] Taking Active Spouse/Significant Other           Med Note Wilmon Pali, MELISSA R   Thu Jul 26, 2021 12:11 PM) B-3  OXYGEN [id number] Yes Inhale 2-2.5 L/min into the lungs See admin instructions. Inhale 2 L/min of oxygen into the lungs at bedtime and when ambulatory [provider] Taking Active Spouse/Significant  Other  predniSONE (DELTASONE) 5 MG tablet [id number] Yes Take 5 mg by mouth daily with breakfast. [provider] Taking Active Spouse/Significant Other  sertraline (ZOLOFT) 50 MG tablet [id number] Yes Take 1 tablet by mouth once daily Cox, Kirsten, MD Taking Active Spouse/Significant Other  sodium chloride (OCEAN) 0.65 % nasal spray [id number] Yes Place 1 spray into the nose as needed for congestion. [provider] Taking Active Spouse/Significant Other            Patient Active Problem List   Diagnosis Date Noted   Atrial fibrillation ([place]) 08/02/2021   Presence of Watchman left atrial appendage closure device 08/02/2021   Chronic respiratory failure with hypoxia ([place]) 01/16/2021   Anxiety 01/14/2021   Lingular pneumonia 01/05/2021   COVID-19 12/24/2020   COPD exacerbation ([place]) 12/09/2020   COPD with acute exacerbation ([place]) 12/08/2020   Physical deconditioning 04/12/2020   Syncope 04/01/2020   Chronic diastolic CHF (congestive heart failure) ([place]) 04/01/2020   Osteoporosis with current pathological fracture 02/06/2020   Essential hypertension 01/28/2020   Chronic bilateral thoracic back pain 01/25/2020   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/25/2020   HLD (hyperlipidemia) 01/05/2020   Vertebral compression fracture ([place]) 05/29/2019   Chronic anemia 05/29/2019   Pulmonary nodules 05/29/2019   Coronary artery calcification 04/06/2019   Atherosclerosis of aorta ([place]) 04/06/2019   Paroxysmal atrial fibrillation ([place])    Acute on chronic respiratory failure with hypoxia ([place])    Lung nodule < 6cm on CT 05/29/2016   Former cigarette smoker 12/05/2015   COPD GOLD  III      Immunization History  Administered Date(s) Administered   Fluad Quad(high Dose 65+) 10/02/2020   Influenza Split 09/22/2013, 09/01/2015   Influenza Whole 09/04/2011, 09/21/2012   Influenza, High Dose Seasonal PF 09/02/2016, 09/10/2017, 08/31/2018,  09/24/2018, 10/21/2019    Influenza-Unspecified 08/23/2014   Moderna SARS-COV2 Booster Vaccination 07/05/2021   Moderna Sars-Covid-2 Vaccination 01/17/2020, 02/14/2020, 10/06/2020   Pneumococcal Conjugate-13 01/11/2014, 12/09/2016   Pneumococcal Polysaccharide-23 08/23/2010, 08/29/2011   Zoster Recombinat (Shingrix) 04/04/2014    Conditions to be addressed/monitored:  Hypertension, Hyperlipidemia, Diabetes, Atrial Fibrillation and COPD  Care Plan : [place]  Updates made by Burnice Logan, [place] since 08/09/2021 12:00 AM     Problem: afib, dm, copd, hld   Priority: High  Onset Date: 02/14/2021     Long-Range Goal: Disease Management   Start Date: 02/14/2021  Expected End Date: 02/14/2022  Recent Progress: On track  Priority: High  Note:   Current Barriers:  Unable to independently afford treatment regimen  Pharmacist Clinical Goal(s):  Over the next 90 days, patient will verbalize ability to afford treatment regimen through collaboration with PharmD and provider.   Interventions: 1:1 collaboration with [name], MD regarding development and update of comprehensive plan of care as evidenced by provider attestation and co-signature Inter-disciplinary care team collaboration (see longitudinal plan of care) Comprehensive medication review performed; medication list updated in electronic medical record  Hyperlipidemia: (LDL goal < 100) -Controlled -Current treatment: Atorvastatin 40 mg daily after supper -Medications previously tried: none reported  -Current dietary patterns: eats healthy -Current exercise habits: starting pulmonary rehab -Educated on Cholesterol goals;  Benefits of statin for ASCVD risk reduction; Importance of limiting foods high in cholesterol; -Counseled on diet and exercise extensively Recommended to continue current medication  Atrial Fibrillation (Goal: prevent stroke and major bleeding) -Controlled -CHADSVASC: 5 -Current treatment: Rate control: diltiazem  CD 120 mg daily, bisoprolol 5 mg daily Anticoagulation: Eliquis 5 mg bid (Watchman device 07/2021 - patient should be transitioning off Eliquis ~09/2021)  -Medications previously tried: none reported  -Home BP and HR readings: 140/60 pulse 80  -Counseled on importance of adherence to anticoagulant exactly as prescribed; avoidance of NSAIDs due to increased bleeding risk with anticoagulants; coordinating application of Eliquis once 3% out of pocket proof received.  -Recommended to continue current medication. Patient reports Eliquis should be discontinued in 6 weeks or so since Watchman device completed.   COPD (Goal: control symptoms and prevent exacerbations) -Controlled -Current treatment  Albuterol inhaler 2 puffs every 6 hours prn for wheezing/shortness of breath albuterol every 6 hours prn  Breztri 2 puffs bid  Prednisone 5 mg daily  Spiriva 2.5 mcg/act 2 puffs into lungs daily  Mucinex 1200 mg bid  -Medications previously tried: Trelegy  -Gold Grade: Gold 3 (FEV1 30-49%) -Exacerbations requiring treatment in last 6 months: yes -Patient reports consistent use of maintenance inhaler -Frequency of rescue inhaler use: using levalbuterol nebulizer prn -Counseled on Proper inhaler technique; Benefits of consistent maintenance inhaler use -Recommended continue current medications. Discussed renewal process for Breztri for 2023. Pharmacy team will assist.    Patient Goals/Self-Care Activities Over the next 90 days, patient will:  - take medications as prescribed  Follow Up Plan: Telephone follow up appointment with care management team member scheduled for:11/2021 for check-in and patient assistance coordination for 2023       Medication Assistance:  Breztri and Eliquisobtained through [place] and [employer] and [employer] medication assistance program.  Enrollment ends 12/22/2021  Patient's preferred pharmacy is:  [facility], [address] [place] [address] Phone: [phone] Fax: [phone]  Uses pill box? Yes Pt endorses 100% compliance  We discussed: Current pharmacy is preferred  with insurance plan and patient is satisfied with pharmacy services Patient decided to: Continue current medication management strategy  Care Plan and Follow Up Patient Decision:  Patient agrees to Care Plan and Follow-up.  Plan: Telephone follow up appointment with care management team member scheduled for:  11/2021

## 2021-08-08 ENCOUNTER — Ambulatory Visit (INDEPENDENT_AMBULATORY_CARE_PROVIDER_SITE_OTHER): Payer: Medicare Other

## 2021-08-08 ENCOUNTER — Other Ambulatory Visit: Payer: Self-pay

## 2021-08-08 DIAGNOSIS — I482 Chronic atrial fibrillation, unspecified: Secondary | ICD-10-CM

## 2021-08-08 DIAGNOSIS — E782 Mixed hyperlipidemia: Secondary | ICD-10-CM

## 2021-08-08 DIAGNOSIS — E1169 Type 2 diabetes mellitus with other specified complication: Secondary | ICD-10-CM | POA: Diagnosis not present

## 2021-08-08 DIAGNOSIS — J449 Chronic obstructive pulmonary disease, unspecified: Secondary | ICD-10-CM | POA: Diagnosis not present

## 2021-08-09 NOTE — Patient Instructions (Signed)
Visit Information   Goals Addressed             This Visit's Progress    Learn and Do Breathing Exercises-COPD   On track    Timeframe:  Long-Range Goal Priority:  High Start Date:                  02/14/21           Expected End Date:  02/14/22                     Follow Up Date 03/26/2021    - do breathing exercises every day - do exercises in a comfortable position that makes breathing as easy as possible    Why is this important?   Breathing exercises can help lessen the cough that comes with chronic obstructive pulmonary disease.  Doing the exercises will give you more energy.  They will also help you to control your symptoms.    Notes:      Learn More About My Health   On track    Timeframe:  Long-Range Goal Priority:  High Start Date:       02/14/21                      Expected End Date:     02/14/22                   Follow Up Date 02/14/22    - ask questions - repeat what I heard to make sure I understand - bring a list of my medicines to the visit    Why is this important?   The best way to learn about your health and care is by talking to the doctor and nurse.  They will answer your questions and give you information in the way that you like best.    Notes:      Manage My Medicine   On track    Timeframe:  Long-Range Goal Priority:  High Start Date:              02/14/21                Expected End Date:   02/14/22                    Follow Up Date 03/26/21    - call for medicine refill 2 or 3 days before it runs out - keep a list of all the medicines I take; vitamins and herbals too - use a pillbox to sort medicine    Why is this important?   These steps will help you keep on track with your medicines.   Notes:        Patient Care Plan: CCM Pharmacy Care Plan     Problem Identified: afib, dm, copd, hld   Priority: High  Onset Date: 02/14/2021     Long-Range Goal: Disease Management   Start Date: 02/14/2021  Expected End Date:  02/14/2022  Recent Progress: On track  Priority: High  Note:   Current Barriers:  Unable to independently afford treatment regimen  Pharmacist Clinical Goal(s):  Over the next 90 days, patient will verbalize ability to afford treatment regimen through collaboration with PharmD and provider.   Interventions: 1:1 collaboration with [name], MD regarding development and update of comprehensive plan of care as evidenced by provider attestation and co-signature Inter-disciplinary care team collaboration (see longitudinal plan of  care) Comprehensive medication review performed; medication list updated in electronic medical record  Hyperlipidemia: (LDL goal < 100) -Controlled -Current treatment: Atorvastatin 40 mg daily after supper -Medications previously tried: none reported  -Current dietary patterns: eats healthy -Current exercise habits: starting pulmonary rehab -Educated on Cholesterol goals;  Benefits of statin for ASCVD risk reduction; Importance of limiting foods high in cholesterol; -Counseled on diet and exercise extensively Recommended to continue current medication  Atrial Fibrillation (Goal: prevent stroke and major bleeding) -Controlled -CHADSVASC: 5 -Current treatment: Rate control: diltiazem CD 120 mg daily, bisoprolol 5 mg daily Anticoagulation: Eliquis 5 mg bid (Watchman device 07/2021 - patient should be transitioning off Eliquis ~09/2021)  -Medications previously tried: none reported  -Home BP and HR readings: 140/60 pulse 80  -Counseled on importance of adherence to anticoagulant exactly as prescribed; avoidance of NSAIDs due to increased bleeding risk with anticoagulants; coordinating application of Eliquis once 3% out of pocket proof received.  -Recommended to continue current medication. Patient reports Eliquis should be discontinued in 6 weeks or so since Watchman device completed.   COPD (Goal: control symptoms and prevent  exacerbations) -Controlled -Current treatment  Albuterol inhaler 2 puffs every 6 hours prn for wheezing/shortness of breath albuterol every 6 hours prn  Breztri 2 puffs bid  Prednisone 5 mg daily  Spiriva 2.5 mcg/act 2 puffs into lungs daily  Mucinex 1200 mg bid  -Medications previously tried: Trelegy  -Gold Grade: Gold 3 (FEV1 30-49%) -Exacerbations requiring treatment in last 6 months: yes -Patient reports consistent use of maintenance inhaler -Frequency of rescue inhaler use: using levalbuterol nebulizer prn -Counseled on Proper inhaler technique; Benefits of consistent maintenance inhaler use -Recommended continue current medications. Discussed renewal process for Breztri for 2023. Pharmacy team will assist.    Patient Goals/Self-Care Activities Over the next 90 days, patient will:  - take medications as prescribed  Follow Up Plan: Telephone follow up appointment with care management team member scheduled for:11/2021 for check-in and patient assistance coordination for 2023      Patient verbalizes understanding of instructions provided today and agrees to view in [place].  Telephone follow up appointment with pharmacy team member scheduled for: 11/2021 for Patient Assistance renewal  Burnice Logan, [facility]

## 2021-08-22 DIAGNOSIS — I480 Paroxysmal atrial fibrillation: Secondary | ICD-10-CM | POA: Diagnosis not present

## 2021-08-22 DIAGNOSIS — I10 Essential (primary) hypertension: Secondary | ICD-10-CM | POA: Diagnosis not present

## 2021-08-22 DIAGNOSIS — E119 Type 2 diabetes mellitus without complications: Secondary | ICD-10-CM | POA: Diagnosis not present

## 2021-08-22 DIAGNOSIS — J449 Chronic obstructive pulmonary disease, unspecified: Secondary | ICD-10-CM | POA: Diagnosis not present

## 2021-08-29 ENCOUNTER — Other Ambulatory Visit: Payer: Self-pay | Admitting: Family Medicine

## 2021-08-31 ENCOUNTER — Telehealth: Payer: Self-pay

## 2021-08-31 NOTE — Chronic Care Management (AMB) (Signed)
Chronic Care Management Pharmacy Assistant   Name: Paul [name]  MRN: [id number] DOB: Apr 30, 1946  Reason for Encounter: COPD Disease State Call  Recent office visits:  No visits noted  Recent consult visits:  No visits noted  Hospital visits:  None in previous 6 months  Medications: Outpatient Encounter Medications as of 08/31/2021  Medication Sig Note   albuterol (PROAIR HFA) 108 (90 Base) MCG/ACT inhaler Inhale 2 puffs into the lungs every 6 (six) hours as needed for wheezing or shortness of breath.     albuterol (PROVENTIL) (2.5 MG/3ML) 0.083% nebulizer solution Take 3 mLs (2.5 mg total) by nebulization in the morning and at bedtime.    ALPRAZolam (XANAX) 0.25 MG tablet Take 1 tablet (0.25 mg total) by mouth 2 (two) times daily as needed for anxiety.    apixaban (ELIQUIS) 5 MG TABS tablet Take 1 tablet (5 mg total) by mouth 2 (two) times daily.    Ascorbic Acid (VITAMIN C) 1000 MG tablet Take 1,000 mg by mouth daily.    ascorbic acid (VITAMIN C) 500 MG tablet Take 1 tablet (500 mg total) by mouth daily. (Patient not taking: Reported on 08/09/2021)    aspirin 81 MG chewable tablet Chew 1 tablet (81 mg total) by mouth daily. Okay to swallow whole    atorvastatin (LIPITOR) 40 MG tablet TAKE 1 TABLET BY MOUTH ONCE DAILY AFTER SUPPER    bisoprolol (ZEBETA) 5 MG tablet Take 1 tablet (5 mg total) by mouth daily.    Budeson-Glycopyrrol-Formoterol (BREZTRI AEROSPHERE) 160-9-4.8 MCG/ACT AERO Inhale 2 puffs into the lungs 2 (two) times daily.    calcium carbonate (OS-CAL) 600 MG TABS tablet Take 600 mg by mouth daily.    Cholecalciferol (VITAMIN D) 50 MCG (2000 UT) CAPS Take 2,000 Units by mouth daily.    denosumab (PROLIA) 60 MG/ML SOSY injection Inject 60 mg into the skin every 6 (six) months.    diltiazem (CARDIZEM) 60 MG tablet Take 1 tablet (60 mg total) by mouth 4 (four) times daily as needed.    ferrous sulfate 325 (65 FE) MG tablet Take 325 mg by mouth 2 (two) times daily with a  meal.    furosemide (LASIX) 40 MG tablet Take 1 tablet (40 mg total) by mouth daily.    guaiFENesin (MUCINEX) 600 MG 12 hr tablet Take 600 mg by mouth daily.    loratadine (CLARITIN) 10 MG tablet Take 10 mg by mouth in the morning.    Multiple Vitamin (MULTIVITAMIN WITH MINERALS) TABS tablet Take 1 tablet by mouth daily.    Niacinamide-Zn-Cu-Methfo-Se-Cr (NICOTINAMIDE PO) Take 1 tablet by mouth in the morning and at bedtime. 07/26/2021: B-3   OXYGEN Inhale 2-2.5 L/min into the lungs See admin instructions. Inhale 2 L/min of oxygen into the lungs at bedtime and when ambulatory    predniSONE (DELTASONE) 5 MG tablet Take 5 mg by mouth daily with breakfast.    sertraline (ZOLOFT) 50 MG tablet Take 1 tablet by mouth once daily    sodium chloride (OCEAN) 0.65 % nasal spray Place 1 spray into the nose as needed for congestion.    No facility-administered encounter medications on file as of 08/31/2021.   CAT Score 02/05/2021 01/03/2021  Total CAT Score 16 17   Current COPD regimen:  Albuterol inhaler 2 puffs every 6 hours prn for wheezing/shortness of breath albuterol Take 3 mLs by nebulization in the morning and at bedtime- as needed Breztri 2 puffs bid  Prednisone 5 mg daily  Spiriva 2.5  mcg/act 2 puffs into lungs daily- Patient no longer taking  Mucinex 1200 mg bid   Any recent hospitalizations or ED visits since last visit with CPP? No watchman cardiology started aspirin 08/11 and eliquis-   denies COPD symptoms   What recent interventions/DTPs have been made by any provider to improve breathing since last visit: Patient stated he had a watchman placed in his heart by cardiology. He was also placed on aspirin 81 mcg since then and stated that his providers plan is to discontinue eliquis   Have you had exacerbation/flare-up since last visit?  Patient stated he has not had any exacerbations in the last two weeks  What do you do when you are short of breath?   Patient stated he uses his rescue  inhaler and rest when he is short of breath  Current tobacco use? Patient stated he has not smoked since 2010  Respiratory Devices/Equipment Do you have a nebulizer? Yes Do you use a Peak Flow Meter? Yes Do you use a maintenance inhaler? Yes How often do you forget to use your daily inhaler? Never Do you use a rescue inhaler? Yes How often do you use your rescue inhaler?  several times per month Do you use a spacer with your inhaler? Yes  Adherence Review: Does the patient have >5 day gap between last estimated fill date for maintenance inhaler medications?  Albuterol inhaler - 15 DS last filled 01/15/21 albuterol every 6 hours prn  Breztri 2 puffs bid- PAP Prednisone 5 mg daily  Mucinex 1200 mg bid   Star Rating Drugs: Atorvastatin 40 mg- 90 DS last filled 08/29/21  Care Gaps: Last annual wellness visit? 03/05/21  [employer] [employer] CPA, CMA

## 2021-09-05 ENCOUNTER — Other Ambulatory Visit: Payer: Self-pay | Admitting: Adult Health

## 2021-09-05 ENCOUNTER — Telehealth: Payer: Self-pay

## 2021-09-05 NOTE — Telephone Encounter (Signed)
The patient no-showed to his pre-TEE visit today. Called to reschedule since he will need a visit prior to TEE next week.  Left message to call back.

## 2021-09-06 ENCOUNTER — Encounter: Payer: Self-pay | Admitting: *Deleted

## 2021-09-06 ENCOUNTER — Other Ambulatory Visit: Payer: Self-pay

## 2021-09-06 ENCOUNTER — Encounter: Payer: Self-pay | Admitting: Physician Assistant

## 2021-09-06 ENCOUNTER — Ambulatory Visit (INDEPENDENT_AMBULATORY_CARE_PROVIDER_SITE_OTHER): Payer: Medicare Other | Admitting: Physician Assistant

## 2021-09-06 ENCOUNTER — Ambulatory Visit (INDEPENDENT_AMBULATORY_CARE_PROVIDER_SITE_OTHER): Payer: Medicare Other

## 2021-09-06 VITALS — BP 120/60 | HR 70 | Ht 66.0 in | Wt 156.0 lb

## 2021-09-06 DIAGNOSIS — Z95818 Presence of other cardiac implants and grafts: Secondary | ICD-10-CM | POA: Diagnosis not present

## 2021-09-06 DIAGNOSIS — I480 Paroxysmal atrial fibrillation: Secondary | ICD-10-CM | POA: Diagnosis not present

## 2021-09-06 DIAGNOSIS — M818 Other osteoporosis without current pathological fracture: Secondary | ICD-10-CM | POA: Diagnosis not present

## 2021-09-06 DIAGNOSIS — R319 Hematuria, unspecified: Secondary | ICD-10-CM

## 2021-09-06 MED ORDER — DENOSUMAB 60 MG/ML ~~LOC~~ SOSY
60.0000 mg | PREFILLED_SYRINGE | Freq: Once | SUBCUTANEOUS | Status: AC
  Administered 2021-09-06: 60 mg via SUBCUTANEOUS

## 2021-09-06 NOTE — Progress Notes (Addendum)
[facility]                                     Cardiology Office Note:    Date:  09/06/2021   ID:  Paul [name], DOB 08-May-1946, MRN [id number]  PCP:  [name], MD  [facility] Cardiologist:  Candee Furbish, MD  [facility] Electrophysiologist:  Vickie Epley, MD   Referring MD: [name], MD   Follow up s/p LAAO - set up 45 day TEE  History of Present Illness:    Paul [name] is a 75 y.o. male with a hx of HTN, HLD, GERD, DMT2, COPD, PAF with poor candidacy for long term anticoagulation due to  severe bleeding 2/2 hematuria, falls and skin tears s/p LAAO with Watchman on 08/02/21 who presents to clinic for follow up.   He underwent successful TAAO with a 20 mm Watchman FLX device by Dr. Quentin Ore on 08/02/21 for paroxysmal atrial fibrillation in the setting of hematuria, falls and skin tears. His hospital course was uncomplicated and he was discharged on POD 1. He was discharged on Eliquis '5mg'$  BID +  ASA '81mg'$  daily with a plan to switch to Plavix monotherapy if 45 day TEE demonstrated an adequate seal for a total of 6 months post procedure.   Today the patient presents to clinic for follow up. Here with wife. Doing well. No CP or SOB. No LE edema, orthopnea or PND. No dizziness or syncope. No blood in stool or urine. No palpitations. Only complaint is ongoing bruising all over his body, which is chronic and partly why he is excited to hopefully be off Eliquis in the future.    Past Medical History:  Diagnosis Date   Anemia    Aortic atherosclerosis (HCC)    Atrial fibrillation (HCC)    Back pain    COPD (chronic obstructive pulmonary disease) (HCC)    Diabetes mellitus without complication (HCC)    Elevated PSA    GAD (generalized anxiety disorder)    GERD (gastroesophageal reflux disease)    High cholesterol    Hypertension    Osteoporosis    Oxygen deficiency    Pneumonia    January 2022   Presence of Watchman  left atrial appendage closure device 08/02/2021   s/p LAAO by Dr. Quentin Ore with a 20 mm Watchman FLX device    Past Surgical History:  Procedure Laterality Date   CATARACT EXTRACTION Right    LEFT ATRIAL APPENDAGE OCCLUSION N/A 08/02/2021   Procedure: LEFT ATRIAL APPENDAGE OCCLUSION;  Surgeon: Sherren Mocha, MD;  Location: [facility];  Service: Cardiovascular;  Laterality: N/A;   SKIN SURGERY     shoulders and chest   TEE WITHOUT CARDIOVERSION N/A 08/02/2021   Procedure: TRANSESOPHAGEAL ECHOCARDIOGRAM (TEE);  Surgeon: Sherren Mocha, MD;  Location: [facility];  Service: Cardiovascular;  Laterality: N/A;    Current Medications: Current Meds  Medication Sig   albuterol (PROAIR HFA) 108 (90 Base) MCG/ACT inhaler Inhale 2 puffs into the lungs every 6 (six) hours as needed for wheezing or shortness of breath.    albuterol (PROVENTIL) (2.5 MG/3ML) 0.083% nebulizer solution Take 3 mLs (2.5 mg total) by nebulization in the morning and at bedtime.   ALPRAZolam (XANAX) 0.25 MG tablet Take 1 tablet (0.25 mg total) by mouth 2 (two) times daily as needed for anxiety.  apixaban (ELIQUIS) 5 MG TABS tablet Take 1 tablet (5 mg total) by mouth 2 (two) times daily.   Ascorbic Acid (VITAMIN C) 1000 MG tablet Take 1,000 mg by mouth daily.   aspirin 81 MG chewable tablet Chew 1 tablet (81 mg total) by mouth daily. Okay to swallow whole   atorvastatin (LIPITOR) 40 MG tablet TAKE 1 TABLET BY MOUTH ONCE DAILY AFTER SUPPER   bisoprolol (ZEBETA) 5 MG tablet Take 1 tablet (5 mg total) by mouth daily.   Budeson-Glycopyrrol-Formoterol (BREZTRI AEROSPHERE) 160-9-4.8 MCG/ACT AERO Inhale 2 puffs into the lungs 2 (two) times daily.   calcium carbonate (OS-CAL) 600 MG TABS tablet Take 600 mg by mouth daily.   Cholecalciferol (VITAMIN D) 50 MCG (2000 UT) CAPS Take 2,000 Units by mouth daily.   denosumab (PROLIA) 60 MG/ML SOSY injection Inject 60 mg into the skin every 6 (six) months.   diltiazem  (CARDIZEM) 60 MG tablet Take 1 tablet (60 mg total) by mouth 4 (four) times daily as needed.   ferrous sulfate 325 (65 FE) MG tablet Take 325 mg by mouth 2 (two) times daily with a meal.   furosemide (LASIX) 40 MG tablet Take 1 tablet (40 mg total) by mouth daily.   guaiFENesin (MUCINEX) 600 MG 12 hr tablet Take 600 mg by mouth daily.   loratadine (CLARITIN) 10 MG tablet Take 10 mg by mouth in the morning.   Multiple Vitamin (MULTIVITAMIN WITH MINERALS) TABS tablet Take 1 tablet by mouth daily.   Niacinamide-Zn-Cu-Methfo-Se-Cr (NICOTINAMIDE PO) Take 1 tablet by mouth in the morning and at bedtime.   OXYGEN Inhale 2-2.5 L/min into the lungs See admin instructions. Inhale 2 L/min of oxygen into the lungs at bedtime and when ambulatory   predniSONE (DELTASONE) 5 MG tablet Take 5 mg by mouth daily with breakfast.   sertraline (ZOLOFT) 50 MG tablet Take 1 tablet by mouth once daily   sodium chloride (OCEAN) 0.65 % nasal spray Place 1 spray into the nose as needed for congestion.     Allergies:   Daliresp [roflumilast], Alendronate, and Levaquin [levofloxacin]   Social History   Socioeconomic History   Marital status: Married    Spouse name: Not on file   Number of children: 3   Years of education: Not on file   Highest education level: Not on file  Occupational History   Occupation: retired    Comment: truck driver  Tobacco Use   Smoking status: Former    Packs/day: 2.00    Years: 52.00    Pack years: 104.00    Types: Cigarettes    Quit date: 02/03/2009    Years since quitting: 12.5   Smokeless tobacco: Never   Tobacco comments:    Counseled to remain smoke free  Vaping Use   Vaping Use: Never used  Substance and Sexual Activity   Alcohol use: Not Currently    Alcohol/week: 0.0 standard drinks    Comment: occ   Drug use: No   Sexual activity: Not on file  Other Topics Concern   Not on file  Social History Narrative   Originally from [place]. Previously worked driving a Sales promotion account executive. No pets currently. No mold exposure. 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       wears sunscreen, brushes and flosses daily, see's dentist bi-annually, has smoke/carbon monoxide detectors, wears a seatbelt and practices gun safety      Social Determinants  of Health   Financial Resource Strain: Not on file  Food Insecurity: No Food Insecurity   Worried About [place] in the Last Year: Never true   Ran Out of Food in the Last Year: Never true  Transportation Needs: No Transportation Needs   Lack of Transportation (Medical): No   Lack of Transportation (Non-Medical): No  Physical Activity: Not on file  Stress: Not on file  Social Connections: Not on file     Family History: The patient's family history includes Cancer in his paternal uncle; Heart disease in his brother, father, and mother.  ROS:   Please see the history of present illness.    All other systems reviewed and are negative.  EKGs/Labs/Other Studies Reviewed:    The following studies were reviewed today:  08/02/21 Transeptal Puncture Intra-procedural TEE which showed no LAA thrombus Left atrial appendage occlusive device placement on 08/02/21 by Dr. Quentin Ore.  This study demonstrated: IMPRESSIONS    1. Echo guided left atrial appendage closure. No LAA thrombus present.  The IAS was crossed in a mid and inferior location of the IAS. A 20 mm  Watchma FLX device was deployed with maximum diameter 16.8 mm (16%  compression). The device was well seated  without movement. No device leaks were present. The device remained stable  after deployment. A small pericardial effusion was present before the  case, and this did not change. A small residual iatrogenic ASD was present  due to transseptal puncture and  this demonstrated L to R shunting which was benign. No apparent  complications visualized. 3D post-processing confirmed no device leak  and  optimal placement in the LAA.   2. Left ventricular ejection fraction, by estimation, is 60 to 65%. The  left ventricle has normal function. The left ventricle has no regional  wall motion abnormalities.   3. Right ventricular systolic function is normal. The right ventricular  size is normal.   4. No left atrial/left atrial appendage thrombus was detected.   5. A small pericardial effusion is present. The pericardial effusion is  circumferential.   6. The mitral valve is grossly normal. Trivial mitral valve  regurgitation. No evidence of mitral stenosis.   7. The aortic valve is tricuspid. Aortic valve regurgitation is not  visualized. No aortic stenosis is present.   8. There is mild (Grade II) layered plaque involving the descending aorta  and transverse aorta.     EKG:  EKG is  ordered today.  The ekg ordered today demonstrates sinus HR 70  Recent Labs: 01/30/2021: B Natriuretic Peptide 90.9 07/03/2021: ALT 25; Hemoglobin 12.4; Platelets 307 08/03/2021: BUN 15; Creatinine, Ser 0.90; Potassium 4.6; Sodium 138  Recent Lipid Panel    Component Value Date/Time   CHOL 159 07/03/2021 0812   TRIG 131 07/03/2021 0812   HDL 53 07/03/2021 0812   CHOLHDL 3.0 07/03/2021 0812   LDLCALC 83 07/03/2021 0812     Risk Assessment/Calculations:    CHA2DS2-VASc Score = 5   This indicates a 7.2% annual risk of stroke. The patient's score is based upon: CHF History: 1 HTN History: 1 Diabetes History: 1 Stroke History: 0 Vascular Disease History: 0 Age Score: 2 Gender Score: 0      Physical Exam:    VS:  BP 120/60   Pulse 70   Ht '5\' 6"'$  (1.676 m)   Wt 156 lb (70.8 kg)   SpO2 95%   BMI 25.18 kg/m     Wt Readings from Last  3 Encounters:  09/06/21 156 lb (70.8 kg)  08/02/21 151 lb (68.5 kg)  07/06/21 153 lb 12.8 oz (69.8 kg)     GEN: Well nourished, well developed in no acute distress HEENT: Normal NECK: No JVD LYMPHATICS: No lymphadenopathy CARDIAC: RRR, no murmurs,  rubs, gallops RESPIRATORY:  Clear to auscultation without rales, wheezing or rhonchi  ABDOMEN: Soft, non-tender, non-distended MUSCULOSKELETAL:  No edema; No deformity  SKIN: Warm and dry. Diffuse ecchymosis NEUROLOGIC:  Alert and oriented x 3 PSYCHIATRIC:  Normal affect   ASSESSMENT:    1. Paroxysmal atrial fibrillation (HCC)   2. Hematuria, unspecified type   3. Presence of Watchman left atrial appendage closure device    PLAN:    In order of problems listed above:  Persistent afib s/p LAAO: underwent successful LAAO with a 20 mm Watchman FLX device by Dr. Quentin Ore on 08/02/21 for paroxymal atrial fibrillation in the setting of hematuria, falls and skins tears. Doing well on Eliquis '5mg'$  BID with concurrent aspirin 81 mg daily. Only issues is diffuse bruising. Will plan to switch to Plavix monotherapy if 45 day TEE demonstrates adequate seal. This has been set up for 9/22 with Dr. Sallyanne Kuster. Will get CMET and BMP. Went over instructions and consent for TEE. SBE prophylaxis x 6 months discussed; the patient is edentulous and does not go to the dentist.    Shared Decision Making/Informed Consent The risks [esophageal damage, perforation (1:10,000 risk), bleeding, pharyngeal hematoma as well as other potential complications associated with conscious sedation including aspiration, arrhythmia, respiratory failure and death], benefits (treatment guidance and diagnostic support) and alternatives of a transesophageal echocardiogram were discussed in detail with Paul [name] and he is willing to proceed.     Medication Adjustments/Labs and Tests Ordered: Current medicines are reviewed at length with the patient today.  Concerns regarding medicines are outlined above.  Orders Placed This Encounter  Procedures   Basic metabolic panel   CBC   EKG 12-Lead    No orders of the defined types were placed in this encounter.   Patient Instructions  Medication Instructions:  No changes *If you need  a refill on your cardiac medications before your next appointment, please call your pharmacy*   Lab Work: Today: BMET, CBC  If you have labs (blood work) drawn today and your tests are completely normal, you will receive your results only by: [place] (if you have MyChart) OR A paper copy in the mail If you have any lab test that is abnormal or we need to change your treatment, we will call you to review the results.   Testing/Procedures: TEE as scheduled.  See instructions below.   Follow-Up: As planned  Other Instructions   TEE INSTRUCTIONS: You are scheduled for a TEE on 09/13/21 with Dr. Sallyanne Kuster.  Please arrive at the [facility] (Main Entrance A) at [facility]: [address] [place], [place] [address] at 10:00 am.  You are allowed ONE visitor in the waiting room during your procedure. Both you and your guest must wear masks.  DIET: Nothing to eat or drink after midnight except a sip of water with medications.  MEDICATION INSTRUCTIONS:  1.) You may take your medications as directed with sips of water.  LABS: today: BMET, CBC  You must have a responsible person to drive you home and stay in the waiting area during your procedure. Failure to do so could result in cancellation.  Bring your insurance cards.  *Special Note: Every effort is made  to have your procedure done on time. Occasionally there are emergencies that occur at the hospital that may cause delays. Please be patient if a delay does occur.    Signed, Angelena Form, PA-C  09/06/2021 3:24 PM    [facility]

## 2021-09-06 NOTE — H&P (View-Only) (Signed)
[facility] [place]                                     Cardiology Office Note:    Date:  09/06/2021   ID:  Paul [name], DOB September 12, 1946, MRN [id number]  PCP:  Paul Brome, MD  [facility] Cardiologist:  Paul Furbish, MD  [facility] Electrophysiologist:  Paul Epley, MD   Referring MD: Paul Brome, MD   Follow up s/p LAAO - set up 45 day TEE  History of Present Illness:    Paul [name] is a 75 y.o. male with a hx of HTN, HLD, GERD, DMT2, COPD, PAF with poor candidacy for long term anticoagulation due to  severe bleeding 2/2 hematuria, falls and skin tears s/p LAAO with Watchman on 08/02/21 who presents to clinic for follow up.   He underwent successful TAAO with a 20 mm Watchman FLX device by Dr. Quentin [name] on 08/02/21 for paroxysmal atrial fibrillation in the setting of hematuria, falls and skin tears. His hospital course was uncomplicated and he was discharged on POD 1. He was discharged on Eliquis '5mg'$  BID +  ASA '81mg'$  daily with a plan to switch to Plavix monotherapy if 45 day TEE demonstrated an adequate seal for a total of 6 months post procedure.   Today the patient presents to clinic for follow up. Here with wife. Doing well. No CP or SOB. No LE edema, orthopnea or PND. No dizziness or syncope. No blood in stool or urine. No palpitations. Only complaint is ongoing bruising all over his body, which is chronic and partly why he is excited to hopefully be off Eliquis in the future.    Past Medical History:  Diagnosis Date   Anemia    Aortic atherosclerosis (HCC)    Atrial fibrillation (HCC)    Back pain    COPD (chronic obstructive pulmonary disease) (HCC)    Diabetes mellitus without complication (HCC)    Elevated PSA    GAD (generalized anxiety disorder)    GERD (gastroesophageal reflux disease)    High cholesterol    Hypertension    Osteoporosis    Oxygen deficiency    Pneumonia    January 2022   Presence of Watchman  left atrial appendage closure device 08/02/2021   s/p LAAO by Dr. Quentin [name] with a 20 mm Watchman FLX device    Past Surgical History:  Procedure Laterality Date   CATARACT EXTRACTION Right    LEFT ATRIAL APPENDAGE OCCLUSION N/A 08/02/2021   Procedure: LEFT ATRIAL APPENDAGE OCCLUSION;  Surgeon: Paul Mocha, MD;  Location: [facility];  Service: Cardiovascular;  Laterality: N/A;   SKIN SURGERY     shoulders and chest   TEE WITHOUT CARDIOVERSION N/A 08/02/2021   Procedure: TRANSESOPHAGEAL ECHOCARDIOGRAM (TEE);  Surgeon: Paul Mocha, MD;  Location: [facility];  Service: Cardiovascular;  Laterality: N/A;    Current Medications: Current Meds  Medication Sig   albuterol (PROAIR HFA) 108 (90 Base) MCG/ACT inhaler Inhale 2 puffs into the lungs every 6 (six) hours as needed for wheezing or shortness of breath.    albuterol (PROVENTIL) (2.5 MG/3ML) 0.083% nebulizer solution Take 3 mLs (2.5 mg total) by nebulization in the morning and at bedtime.   ALPRAZolam (XANAX) 0.25 MG tablet Take 1 tablet (0.25 mg total) by mouth 2 (two) times daily as needed for anxiety.  apixaban (ELIQUIS) 5 MG TABS tablet Take 1 tablet (5 mg total) by mouth 2 (two) times daily.   Ascorbic Acid (VITAMIN C) 1000 MG tablet Take 1,000 mg by mouth daily.   aspirin 81 MG chewable tablet Chew 1 tablet (81 mg total) by mouth daily. Okay to swallow whole   atorvastatin (LIPITOR) 40 MG tablet TAKE 1 TABLET BY MOUTH ONCE DAILY AFTER SUPPER   bisoprolol (ZEBETA) 5 MG tablet Take 1 tablet (5 mg total) by mouth daily.   Budeson-Glycopyrrol-Formoterol (BREZTRI AEROSPHERE) 160-9-4.8 MCG/ACT AERO Inhale 2 puffs into the lungs 2 (two) times daily.   calcium carbonate (OS-CAL) 600 MG TABS tablet Take 600 mg by mouth daily.   Cholecalciferol (VITAMIN D) 50 MCG (2000 UT) CAPS Take 2,000 Units by mouth daily.   denosumab (PROLIA) 60 MG/ML SOSY injection Inject 60 mg into the skin every 6 (six) months.   diltiazem  (CARDIZEM) 60 MG tablet Take 1 tablet (60 mg total) by mouth 4 (four) times daily as needed.   ferrous sulfate 325 (65 FE) MG tablet Take 325 mg by mouth 2 (two) times daily with a meal.   furosemide (LASIX) 40 MG tablet Take 1 tablet (40 mg total) by mouth daily.   guaiFENesin (MUCINEX) 600 MG 12 hr tablet Take 600 mg by mouth daily.   loratadine (CLARITIN) 10 MG tablet Take 10 mg by mouth in the morning.   Multiple Vitamin (MULTIVITAMIN WITH MINERALS) TABS tablet Take 1 tablet by mouth daily.   Niacinamide-Zn-Cu-Methfo-Se-Cr (NICOTINAMIDE PO) Take 1 tablet by mouth in the morning and at bedtime.   OXYGEN Inhale 2-2.5 L/min into the lungs See admin instructions. Inhale 2 L/min of oxygen into the lungs at bedtime and when ambulatory   predniSONE (DELTASONE) 5 MG tablet Take 5 mg by mouth daily with breakfast.   sertraline (ZOLOFT) 50 MG tablet Take 1 tablet by mouth once daily   sodium chloride (OCEAN) 0.65 % nasal spray Place 1 spray into the nose as needed for congestion.     Allergies:   Daliresp [roflumilast], Alendronate, and Levaquin [levofloxacin]   Social History   Socioeconomic History   Marital status: Married    Spouse name: Not on file   Number of children: 3   Years of education: Not on file   Highest education level: Not on file  Occupational History   Occupation: retired    Comment: truck driver  Tobacco Use   Smoking status: Former    Packs/day: 2.00    Years: 52.00    Pack years: 104.00    Types: Cigarettes    Quit date: 02/03/2009    Years since quitting: 12.5   Smokeless tobacco: Never   Tobacco comments:    Counseled to remain smoke free  Vaping Use   Vaping Use: Never used  Substance and Sexual Activity   Alcohol use: Not Currently    Alcohol/week: 0.0 standard drinks    Comment: occ   Drug use: No   Sexual activity: Not on file  Other Topics Concern   Not on file  Social History Narrative   Originally from [place]. Previously worked driving a Sales promotion account executive. No pets currently. No mold exposure. 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       wears sunscreen, brushes and flosses daily, see's dentist bi-annually, has smoke/carbon monoxide detectors, wears a seatbelt and practices gun safety      Social Determinants  of Health   Financial Resource Strain: Not on file  Food Insecurity: No Food Insecurity   Worried About [place] in the Last Year: Never true   Ran Out of Food in the Last Year: Never true  Transportation Needs: No Transportation Needs   Lack of Transportation (Medical): No   Lack of Transportation (Non-Medical): No  Physical Activity: Not on file  Stress: Not on file  Social Connections: Not on file     Family History: The patient's family history includes Cancer in his paternal uncle; Heart disease in his brother, father, and mother.  ROS:   Please see the history of present illness.    All other systems reviewed and are negative.  EKGs/Labs/Other Studies Reviewed:    The following studies were reviewed today:  08/02/21 Transeptal Puncture Intra-procedural TEE which showed no LAA thrombus Left atrial appendage occlusive device placement on 08/02/21 by Dr. Quentin [name].  This study demonstrated: IMPRESSIONS    1. Echo guided left atrial appendage closure. No LAA thrombus present.  The IAS was crossed in a mid and inferior location of the IAS. A 20 mm  Watchma FLX device was deployed with maximum diameter 16.8 mm (16%  compression). The device was well seated  without movement. No device leaks were present. The device remained stable  after deployment. A small pericardial effusion was present before the  case, and this did not change. A small residual iatrogenic ASD was present  due to transseptal puncture and  this demonstrated L to R shunting which was benign. No apparent  complications visualized. 3D post-processing confirmed no device leak  and  optimal placement in the LAA.   2. Left ventricular ejection fraction, by estimation, is 60 to 65%. The  left ventricle has normal function. The left ventricle has no regional  wall motion abnormalities.   3. Right ventricular systolic function is normal. The right ventricular  size is normal.   4. No left atrial/left atrial appendage thrombus was detected.   5. A small pericardial effusion is present. The pericardial effusion is  circumferential.   6. The mitral valve is grossly normal. Trivial mitral valve  regurgitation. No evidence of mitral stenosis.   7. The aortic valve is tricuspid. Aortic valve regurgitation is not  visualized. No aortic stenosis is present.   8. There is mild (Grade II) layered plaque involving the descending aorta  and transverse aorta.     EKG:  EKG is  ordered today.  The ekg ordered today demonstrates sinus HR 70  Recent Labs: 01/30/2021: B Natriuretic Peptide 90.9 07/03/2021: ALT 25; Hemoglobin 12.4; Platelets 307 08/03/2021: BUN 15; Creatinine, Ser 0.90; Potassium 4.6; Sodium 138  Recent Lipid Panel    Component Value Date/Time   CHOL 159 07/03/2021 0812   TRIG 131 07/03/2021 0812   HDL 53 07/03/2021 0812   CHOLHDL 3.0 07/03/2021 0812   LDLCALC 83 07/03/2021 0812     Risk Assessment/Calculations:    CHA2DS2-VASc Score = 5   This indicates a 7.2% annual risk of stroke. The patient's score is based upon: CHF History: 1 HTN History: 1 Diabetes History: 1 Stroke History: 0 Vascular Disease History: 0 Age Score: 2 Gender Score: 0      Physical Exam:    VS:  BP 120/60   Pulse 70   Ht '5\' 6"'$  (1.676 m)   Wt 156 lb (70.8 kg)   SpO2 95%   BMI 25.18 kg/m     Wt Readings from Last  3 Encounters:  09/06/21 156 lb (70.8 kg)  08/02/21 151 lb (68.5 kg)  07/06/21 153 lb 12.8 oz (69.8 kg)     GEN: Well nourished, well developed in no acute distress HEENT: Normal NECK: No JVD LYMPHATICS: No lymphadenopathy CARDIAC: RRR, no murmurs,  rubs, gallops RESPIRATORY:  Clear to auscultation without rales, wheezing or rhonchi  ABDOMEN: Soft, non-tender, non-distended MUSCULOSKELETAL:  No edema; No deformity  SKIN: Warm and dry. Diffuse ecchymosis NEUROLOGIC:  Alert and oriented x 3 PSYCHIATRIC:  Normal affect   ASSESSMENT:    1. Paroxysmal atrial fibrillation (HCC)   2. Hematuria, unspecified type   3. Presence of Watchman left atrial appendage closure device    PLAN:    In order of problems listed above:  Persistent afib s/p LAAO: underwent successful TAAO with a 20 mm Watchman FLX device by Dr. Quentin [name] on 08/02/21 for paroxymal atrial fibrillation in the setting of hematuria, falls and skins tears. Doing well on Eliquis '5mg'$  BID with concurrent aspirin 81 mg daily. Only issues is diffuse bruising. Will plan to switch to Plavix monotherapy if 45 day TEE demonstrates adequate seal. This has been set up for 9/22 with Dr. Sallyanne Kuster. Will get CMET and BMP. Went over instructions and consent for TEE. SBE prophylaxis x 6 months discussed; the patient is edentulous and does not go to the dentist.    Shared Decision Making/Informed Consent The risks [esophageal damage, perforation (1:10,000 risk), bleeding, pharyngeal hematoma as well as other potential complications associated with conscious sedation including aspiration, arrhythmia, respiratory failure and death], benefits (treatment guidance and diagnostic support) and alternatives of a transesophageal echocardiogram were discussed in detail with Mr. Ishii and he is willing to proceed.     Medication Adjustments/Labs and Tests Ordered: Current medicines are reviewed at length with the patient today.  Concerns regarding medicines are outlined above.  Orders Placed This Encounter  Procedures   Basic metabolic panel   CBC   EKG 12-Lead    No orders of the defined types were placed in this encounter.   Patient Instructions  Medication Instructions:  No changes *If you need  a refill on your cardiac medications before your next appointment, please call your pharmacy*   Lab Work: Today: BMET, CBC  If you have labs (blood work) drawn today and your tests are completely normal, you will receive your results only by: [place] (if you have MyChart) OR A paper copy in the mail If you have any lab test that is abnormal or we need to change your treatment, we will call you to review the results.   Testing/Procedures: TEE as scheduled.  See instructions below.   Follow-Up: As planned  Other Instructions   TEE INSTRUCTIONS: You are scheduled for a TEE on 09/13/21 with Dr. Sallyanne Kuster.  Please arrive at the [facility] (Main Entrance A) at [facility]: [address] [place],  [address] at 10:00 am.  You are allowed ONE visitor in the waiting room during your procedure. Both you and your guest must wear masks.  DIET: Nothing to eat or drink after midnight except a sip of water with medications.  MEDICATION INSTRUCTIONS:  1.) You may take your medications as directed with sips of water.  LABS: today: BMET, CBC  You must have a responsible person to drive you home and stay in the waiting area during your procedure. Failure to do so could result in cancellation.  Bring your insurance cards.  *Special Note: Every effort is made  to have your procedure done on time. Occasionally there are emergencies that occur at the hospital that may cause delays. Please be patient if a delay does occur.    Signed, Angelena Form, PA-C  09/06/2021 3:24 PM    [facility]

## 2021-09-06 NOTE — Telephone Encounter (Signed)
Confirmed with the patient I made a mistake and he is scheduled TODAY for visit - he did not no-show yesterday. He was grateful for clarification.

## 2021-09-06 NOTE — Patient Instructions (Signed)
Medication Instructions:  No changes *If you need a refill on your cardiac medications before your next appointment, please call your pharmacy*   Lab Work: Today: BMET, CBC  If you have labs (blood work) drawn today and your tests are completely normal, you will receive your results only by: [place] (if you have MyChart) OR A paper copy in the mail If you have any lab test that is abnormal or we need to change your treatment, we will call you to review the results.   Testing/Procedures: TEE as scheduled.  See instructions below.   Follow-Up: As planned  Other Instructions   TEE INSTRUCTIONS: You are scheduled for a TEE on 09/13/21 with Dr. Sallyanne Kuster.  Please arrive at the [facility] (Main Entrance A) at [facility]: [address] [place], [place] [address] at 10:00 am.  You are allowed ONE visitor in the waiting room during your procedure. Both you and your guest must wear masks.  DIET: Nothing to eat or drink after midnight except a sip of water with medications.  MEDICATION INSTRUCTIONS:  1.) You may take your medications as directed with sips of water.  LABS: today: BMET, CBC  You must have a responsible person to drive you home and stay in the waiting area during your procedure. Failure to do so could result in cancellation.  Bring your insurance cards.  *Special Note: Every effort is made to have your procedure done on time. Occasionally there are emergencies that occur at the hospital that may cause delays. Please be patient if a delay does occur.

## 2021-09-07 LAB — BASIC METABOLIC PANEL
BUN/Creatinine Ratio: 14 (ref 10–24)
BUN: 11 mg/dL (ref 8–27)
CO2: 29 mmol/L (ref 20–29)
Calcium: 9.4 mg/dL (ref 8.6–10.2)
Chloride: 96 mmol/L (ref 96–106)
Creatinine, Ser: 0.77 mg/dL (ref 0.76–1.27)
Glucose: 112 mg/dL — ABNORMAL HIGH (ref 65–99)
Potassium: 4.5 mmol/L (ref 3.5–5.2)
Sodium: 139 mmol/L (ref 134–144)
eGFR: 93 mL/min/{1.73_m2} (ref >59)

## 2021-09-07 LAB — CBC
Hematocrit: 36.7 % — ABNORMAL LOW (ref 37.5–51.0)
Hemoglobin: 11.7 g/dL — ABNORMAL LOW (ref 13.0–17.7)
MCH: 24.7 pg — ABNORMAL LOW (ref 26.6–33.0)
MCHC: 31.9 g/dL (ref 31.5–35.7)
MCV: 78 fL — ABNORMAL LOW (ref 79–97)
Platelets: 263 10*3/uL (ref 150–450)
RBC: 4.73 x10E6/uL (ref 4.14–5.80)
RDW: 13.8 % (ref 11.6–15.4)
WBC: 10.3 10*3/uL (ref 3.4–10.8)

## 2021-09-07 NOTE — Progress Notes (Signed)
Pre TEE labs are stable

## 2021-09-11 ENCOUNTER — Telehealth: Payer: Self-pay | Admitting: Internal Medicine

## 2021-09-11 MED ORDER — PREDNISONE 5 MG PO TABS: 5.0000 mg | ORAL_TABLET | Freq: Every day | ORAL | 3 refills | Status: DC

## 2021-09-11 NOTE — Telephone Encounter (Signed)
Pt spouse called pharmacy to get prednisone 5mg  refilled, we denied it.Pt spouse states pt is and has been on 5mg  for awhile and needs a refill.Please advise, needs maintenance prednisone rx refilled.Please advise [phone] Pharmacy-[facility] in [place]

## 2021-09-13 ENCOUNTER — Encounter (HOSPITAL_COMMUNITY)
Admission: RE | Disposition: A | Discharge status: Home / Self Care | Payer: Medicare Other | Source: Home / Self Care | Attending: Cardiovascular Disease

## 2021-09-13 ENCOUNTER — Ambulatory Visit (HOSPITAL_COMMUNITY): Payer: Medicare Other | Admitting: Certified Registered Nurse Anesthetist

## 2021-09-13 ENCOUNTER — Ambulatory Visit (HOSPITAL_BASED_OUTPATIENT_CLINIC_OR_DEPARTMENT_OTHER): Payer: Medicare Other

## 2021-09-13 ENCOUNTER — Other Ambulatory Visit: Payer: Self-pay

## 2021-09-13 ENCOUNTER — Telehealth: Payer: Self-pay

## 2021-09-13 ENCOUNTER — Encounter (HOSPITAL_COMMUNITY): Payer: Self-pay | Admitting: Cardiovascular Disease

## 2021-09-13 ENCOUNTER — Ambulatory Visit (HOSPITAL_COMMUNITY)
Admission: RE | Admit: 2021-09-13 | Discharge: 2021-09-13 | Disposition: A | Discharge status: Home / Self Care | Payer: Medicare Other | Attending: Cardiovascular Disease | Admitting: Cardiovascular Disease

## 2021-09-13 DIAGNOSIS — I700 Atherosclerosis of aorta: Secondary | ICD-10-CM | POA: Diagnosis not present

## 2021-09-13 DIAGNOSIS — I10 Essential (primary) hypertension: Secondary | ICD-10-CM | POA: Diagnosis not present

## 2021-09-13 DIAGNOSIS — Z87891 Personal history of nicotine dependence: Secondary | ICD-10-CM | POA: Insufficient documentation

## 2021-09-13 DIAGNOSIS — I340 Nonrheumatic mitral (valve) insufficiency: Secondary | ICD-10-CM | POA: Diagnosis not present

## 2021-09-13 DIAGNOSIS — Z7982 Long term (current) use of aspirin: Secondary | ICD-10-CM | POA: Diagnosis not present

## 2021-09-13 DIAGNOSIS — R319 Hematuria, unspecified: Secondary | ICD-10-CM

## 2021-09-13 DIAGNOSIS — J449 Chronic obstructive pulmonary disease, unspecified: Secondary | ICD-10-CM | POA: Diagnosis not present

## 2021-09-13 DIAGNOSIS — E7800 Pure hypercholesterolemia, unspecified: Secondary | ICD-10-CM | POA: Diagnosis not present

## 2021-09-13 DIAGNOSIS — I5032 Chronic diastolic (congestive) heart failure: Secondary | ICD-10-CM | POA: Diagnosis not present

## 2021-09-13 DIAGNOSIS — K219 Gastro-esophageal reflux disease without esophagitis: Secondary | ICD-10-CM | POA: Diagnosis not present

## 2021-09-13 DIAGNOSIS — Z95818 Presence of other cardiac implants and grafts: Secondary | ICD-10-CM

## 2021-09-13 DIAGNOSIS — I4819 Other persistent atrial fibrillation: Secondary | ICD-10-CM | POA: Insufficient documentation

## 2021-09-13 DIAGNOSIS — I480 Paroxysmal atrial fibrillation: Secondary | ICD-10-CM | POA: Insufficient documentation

## 2021-09-13 DIAGNOSIS — Z7952 Long term (current) use of systemic steroids: Secondary | ICD-10-CM | POA: Insufficient documentation

## 2021-09-13 DIAGNOSIS — E119 Type 2 diabetes mellitus without complications: Secondary | ICD-10-CM | POA: Diagnosis not present

## 2021-09-13 DIAGNOSIS — I110 Hypertensive heart disease with heart failure: Secondary | ICD-10-CM | POA: Diagnosis not present

## 2021-09-13 DIAGNOSIS — Z7901 Long term (current) use of anticoagulants: Secondary | ICD-10-CM | POA: Insufficient documentation

## 2021-09-13 DIAGNOSIS — Z79899 Other long term (current) drug therapy: Secondary | ICD-10-CM | POA: Insufficient documentation

## 2021-09-13 DIAGNOSIS — Z881 Allergy status to other antibiotic agents status: Secondary | ICD-10-CM | POA: Diagnosis not present

## 2021-09-13 HISTORY — PX: TEE WITHOUT CARDIOVERSION: SHX5443

## 2021-09-13 SURGERY — ECHOCARDIOGRAM, TRANSESOPHAGEAL
Anesthesia: Monitor Anesthesia Care

## 2021-09-13 MED ORDER — SODIUM CHLORIDE 0.9 % IV SOLN: INTRAVENOUS | Status: DC

## 2021-09-13 MED ORDER — PROPOFOL 10 MG/ML IV BOLUS
INTRAVENOUS | Status: DC | PRN
  Administered 2021-09-13 (×2): 10 mg via INTRAVENOUS

## 2021-09-13 MED ORDER — PANTOPRAZOLE SODIUM 40 MG PO TBEC: 40.0000 mg | DELAYED_RELEASE_TABLET | Freq: Every day | ORAL | 1 refills | Status: DC

## 2021-09-13 MED ORDER — SODIUM CHLORIDE 0.9 % IV SOLN: INTRAVENOUS | Status: DC | PRN

## 2021-09-13 MED ORDER — CLOPIDOGREL BISULFATE 75 MG PO TABS: 75.0000 mg | ORAL_TABLET | Freq: Every day | ORAL | 1 refills | Status: DC

## 2021-09-13 MED ORDER — LIDOCAINE 2% (20 MG/ML) 5 ML SYRINGE
INTRAMUSCULAR | Status: DC | PRN
  Administered 2021-09-13: 40 mg via INTRAVENOUS

## 2021-09-13 MED ORDER — PHENYLEPHRINE 40 MCG/ML (10ML) SYRINGE FOR IV PUSH (FOR BLOOD PRESSURE SUPPORT)
PREFILLED_SYRINGE | INTRAVENOUS | Status: DC | PRN
  Administered 2021-09-13: 80 ug via INTRAVENOUS

## 2021-09-13 MED ORDER — PROPOFOL 500 MG/50ML IV EMUL
INTRAVENOUS | Status: DC | PRN
  Administered 2021-09-13: 125 ug/kg/min via INTRAVENOUS

## 2021-09-13 NOTE — Transfer of Care (Signed)
Immediate Anesthesia Transfer of Care Note  Patient: Paul [name]  Procedure(s) Performed: TRANSESOPHAGEAL ECHOCARDIOGRAM (TEE)  Patient Location: Endoscopy Unit  Anesthesia Type:MAC  Level of Consciousness: awake, alert  and patient cooperative  Airway & Oxygen Therapy: Patient Spontanous Breathing and Patient connected to nasal cannula oxygen  Post-op Assessment: Report given to RN and Post -op Vital signs reviewed and stable  Post vital signs: Reviewed  Last Vitals:  Vitals Value Taken Time  BP 118/58 09/13/21 1105  Temp    Pulse 72 09/13/21 1106  Resp 19 09/13/21 1106  SpO2 99 % 09/13/21 1106  Vitals shown include unvalidated device data.  Last Pain:  Vitals:   09/13/21 1017  TempSrc: Temporal  PainSc: 0-No pain         Complications: No notable events documented.

## 2021-09-13 NOTE — Telephone Encounter (Signed)
Instructed patient to STOP ELIQUIS and ASPIRIN after 09/16/2021 doses. He will also stop omeprazole. Instruct him to START PLAVIX 75 mg daily and pantoprazole 40 mg once daily on 09/17/2021. He was grateful for call and agrees with plan.

## 2021-09-13 NOTE — Discharge Instructions (Signed)

## 2021-09-13 NOTE — Anesthesia Preprocedure Evaluation (Signed)
Anesthesia Evaluation  Patient identified by MRN, date of birth, ID band Patient awake    Reviewed: Allergy & Precautions, NPO status , Patient's Chart, lab work & pertinent test results  Airway Mallampati: II  TM Distance: >3 FB     Dental  (+) Dental Advisory Given   Pulmonary COPD,  oxygen dependent, former smoker,    breath sounds clear to auscultation       Cardiovascular hypertension, Pt. on medications + CAD and +CHF  + dysrhythmias Atrial Fibrillation  Rhythm:Regular Rate:Normal     Neuro/Psych negative neurological ROS     GI/Hepatic Neg liver ROS, GERD  ,  Endo/Other  diabetes  Renal/GU negative Renal ROS     Musculoskeletal   Abdominal   Peds  Hematology  (+) anemia ,   Anesthesia Other Findings   Reproductive/Obstetrics                             Lab Results  Component Value Date   WBC 10.3 09/06/2021   HGB 11.7 (L) 09/06/2021   HCT 36.7 (L) 09/06/2021   MCV 78 (L) 09/06/2021   PLT 263 09/06/2021   Lab Results  Component Value Date   CREATININE 0.77 09/06/2021   BUN 11 09/06/2021   NA 139 09/06/2021   K 4.5 09/06/2021   CL 96 09/06/2021   CO2 29 09/06/2021    Anesthesia Physical  Anesthesia Plan  ASA: 3  Anesthesia Plan: MAC   Post-op Pain Management:    Induction:   PONV Risk Score and Plan: 2 and Ondansetron and Propofol infusion  Airway Management Planned: Natural Airway and Nasal Cannula  Additional Equipment:   Intra-op Plan:   Post-operative Plan:   Informed Consent: I have reviewed the patients History and Physical, chart, labs and discussed the procedure including the risks, benefits and alternatives for the proposed anesthesia with the patient or authorized representative who has indicated his/her understanding and acceptance.       Plan Discussed with: CRNA  Anesthesia Plan Comments:         Anesthesia Quick Evaluation

## 2021-09-13 NOTE — Telephone Encounter (Signed)
Per Joellen Jersey Thompson's 09/06/2021 office note, "Persistent afib s/p LAAO: underwent successful TAAO with a 20 mm Watchman FLX device by Dr. Quentin Ore on 08/02/21 for paroxymal atrial fibrillation in the setting of hematuria, falls and skins tears. Doing well on Eliquis 5mg  BID with concurrent aspirin 81 mg daily. Only issues is diffuse bruising. Will plan to switch to Plavix monotherapy if 45 day TEE demonstrates adequate seal. This has been set up for 9/22 with Dr. Sallyanne Kuster. Will get CMET and BMP. Went over instructions and consent for TEE. SBE prophylaxis x 6 months discussed; the patient is edentulous and does not go to the dentist."  Per Dr. Sallyanne Kuster, TEE report today (09/13/2021): "RECOMMENDATIONS:      Discontinue anticoagulation per protocol."   Will instruct patient to [place] and ASPIRIN after 09/16/2021 doses. Will instruct him to START PLAVIX 75 mg daily 09/17/2021. Left message to call back.

## 2021-09-13 NOTE — Anesthesia Procedure Notes (Signed)
Procedure Name: MAC Date/Time: 09/13/2021 10:48 AM Performed by: Janene Harvey, CRNA Pre-anesthesia Checklist: Patient identified, Emergency Drugs available, Suction available and Patient being monitored Oxygen Delivery Method: Nasal cannula Induction Type: IV induction Placement Confirmation: positive ETCO2 Dental Injury: Teeth and Oropharynx as per pre-operative assessment

## 2021-09-13 NOTE — Interval H&P Note (Signed)
History and Physical Interval Note:  09/13/2021 10:13 AM  Paul [name]  has presented today for surgery, with the diagnosis of POST WATCHMAN IMPLANT.  The various methods of treatment have been discussed with the patient and family. After consideration of risks, benefits and other options for treatment, the patient has consented to  Procedure(s): TRANSESOPHAGEAL ECHOCARDIOGRAM (TEE) (N/A) as a surgical intervention.  The patient's history has been reviewed, patient examined, no change in status, stable for surgery.  I have reviewed the patient's chart and labs.  Questions were answered to the patient's satisfaction.     Rondle Lohse

## 2021-09-13 NOTE — Op Note (Signed)
INDICATIONS: Watchman 45 day evaluation  PROCEDURE:   Informed consent was obtained prior to the procedure. The risks, benefits and alternatives for the procedure were discussed and the patient comprehended these risks.  Risks include, but are not limited to, cough, sore throat, vomiting, nausea, somnolence, esophageal and stomach trauma or perforation, bleeding, low blood pressure, aspiration, pneumonia, infection, trauma to the teeth and death.    After a procedural time-out, the oropharynx was anesthetized with 20% benzocaine spray.   During this procedure the patient was administered IV propofol by Anesthesiology, Dr. Ola Spurr  The transesophageal probe was inserted in the esophagus and stomach without difficulty and multiple views were obtained.  The patient was kept under observation until the patient left the procedure room.  The patient left the procedure room in stable condition.   Agitated microbubble saline contrast was not administered.  COMPLICATIONS:    There were no immediate complications.  FINDINGS:  Well seated Watchman device without leak or thrombus. No residual septal defect. Normal LV function. No pericardial effusion.  RECOMMENDATIONS:     Discontinue anticoagulation per protocol.  Time Spent Directly with the Patient:  30 minutes   Paul [name] 09/13/2021, 11:01 AM

## 2021-09-13 NOTE — Anesthesia Postprocedure Evaluation (Signed)
Anesthesia Post Note  Patient: Paul [name]  Procedure(s) Performed: TRANSESOPHAGEAL ECHOCARDIOGRAM (TEE)     Patient location during evaluation: PACU Anesthesia Type: MAC Level of consciousness: awake and alert Pain management: pain level controlled Vital Signs Assessment: post-procedure vital signs reviewed and stable Respiratory status: spontaneous breathing, nonlabored ventilation, respiratory function stable and patient connected to nasal cannula oxygen Cardiovascular status: stable and blood pressure returned to baseline Postop Assessment: no apparent nausea or vomiting Anesthetic complications: no   No notable events documented.  Last Vitals:  Vitals:   09/13/21 1115 09/13/21 1125  BP: (!) 113/57 (!) 124/54  Pulse: 70 65  Resp: 18 19  Temp:    SpO2: 99% 95%    Last Pain:  Vitals:   09/13/21 1125  TempSrc:   PainSc: 0-No pain                 Tiajuana Amass

## 2021-09-13 NOTE — Progress Notes (Signed)
  Echocardiogram Echocardiogram Transesophageal has been performed.  Paul [name] 09/13/2021, 12:05 PM

## 2021-09-14 ENCOUNTER — Encounter (HOSPITAL_COMMUNITY): Payer: Self-pay | Admitting: Cardiovascular Disease

## 2021-09-21 DIAGNOSIS — I10 Essential (primary) hypertension: Secondary | ICD-10-CM | POA: Diagnosis not present

## 2021-09-21 DIAGNOSIS — E119 Type 2 diabetes mellitus without complications: Secondary | ICD-10-CM | POA: Diagnosis not present

## 2021-09-21 DIAGNOSIS — I480 Paroxysmal atrial fibrillation: Secondary | ICD-10-CM | POA: Diagnosis not present

## 2021-09-21 DIAGNOSIS — J449 Chronic obstructive pulmonary disease, unspecified: Secondary | ICD-10-CM | POA: Diagnosis not present

## 2021-10-05 ENCOUNTER — Other Ambulatory Visit: Payer: Medicare Other

## 2021-10-05 DIAGNOSIS — I10 Essential (primary) hypertension: Secondary | ICD-10-CM | POA: Diagnosis not present

## 2021-10-05 DIAGNOSIS — E782 Mixed hyperlipidemia: Secondary | ICD-10-CM | POA: Diagnosis not present

## 2021-10-05 DIAGNOSIS — E1169 Type 2 diabetes mellitus with other specified complication: Secondary | ICD-10-CM

## 2021-10-06 LAB — CBC WITH DIFFERENTIAL/PLATELET
Basophils Absolute: 0 10*3/uL (ref 0.0–0.2)
Basos: 0 %
EOS (ABSOLUTE): 0.2 10*3/uL (ref 0.0–0.4)
Eos: 2 %
Hematocrit: 41.3 % (ref 37.5–51.0)
Hemoglobin: 12.8 g/dL — ABNORMAL LOW (ref 13.0–17.7)
Immature Grans (Abs): 0 10*3/uL (ref 0.0–0.1)
Immature Granulocytes: 0 %
Lymphocytes Absolute: 4 10*3/uL — ABNORMAL HIGH (ref 0.7–3.1)
Lymphs: 36 %
MCH: 25 pg — ABNORMAL LOW (ref 26.6–33.0)
MCHC: 31 g/dL — ABNORMAL LOW (ref 31.5–35.7)
MCV: 81 fL (ref 79–97)
Monocytes Absolute: 1 10*3/uL — ABNORMAL HIGH (ref 0.1–0.9)
Monocytes: 9 %
Neutrophils Absolute: 5.8 10*3/uL (ref 1.4–7.0)
Neutrophils: 53 %
Platelets: 311 10*3/uL (ref 150–450)
RBC: 5.12 x10E6/uL (ref 4.14–5.80)
RDW: 13.8 % (ref 11.6–15.4)
WBC: 11 10*3/uL — ABNORMAL HIGH (ref 3.4–10.8)

## 2021-10-06 LAB — LIPID PANEL
Chol/HDL Ratio: 3.3 ratio (ref 0.0–5.0)
Cholesterol, Total: 144 mg/dL (ref 100–199)
HDL: 44 mg/dL (ref >39)
LDL Chol Calc (NIH): 70 mg/dL (ref 0–99)
Triglycerides: 180 mg/dL — ABNORMAL HIGH (ref 0–149)
VLDL Cholesterol Cal: 30 mg/dL (ref 5–40)

## 2021-10-06 LAB — COMPREHENSIVE METABOLIC PANEL
ALT: 29 IU/L (ref 0–44)
AST: 26 IU/L (ref 0–40)
Albumin/Globulin Ratio: 2.4 — ABNORMAL HIGH (ref 1.2–2.2)
Albumin: 4.4 g/dL (ref 3.7–4.7)
Alkaline Phosphatase: 110 IU/L (ref 44–121)
BUN/Creatinine Ratio: 15 (ref 10–24)
BUN: 15 mg/dL (ref 8–27)
Bilirubin Total: <0.2 mg/dL (ref 0.0–1.2)
CO2: 27 mmol/L (ref 20–29)
Calcium: 9.6 mg/dL (ref 8.6–10.2)
Chloride: 101 mmol/L (ref 96–106)
Creatinine, Ser: 0.97 mg/dL (ref 0.76–1.27)
Globulin, Total: 1.8 g/dL (ref 1.5–4.5)
Glucose: 114 mg/dL — ABNORMAL HIGH (ref 70–99)
Potassium: 4.4 mmol/L (ref 3.5–5.2)
Sodium: 142 mmol/L (ref 134–144)
Total Protein: 6.2 g/dL (ref 6.0–8.5)
eGFR: 81 mL/min/{1.73_m2} (ref >59)

## 2021-10-06 LAB — CARDIOVASCULAR RISK ASSESSMENT

## 2021-10-06 LAB — HEMOGLOBIN A1C
Est. average glucose Bld gHb Est-mCnc: 128 mg/dL
Hgb A1c MFr Bld: 6.1 % — ABNORMAL HIGH (ref 4.8–5.6)

## 2021-10-08 ENCOUNTER — Encounter: Payer: Self-pay | Admitting: Family Medicine

## 2021-10-09 ENCOUNTER — Ambulatory Visit (INDEPENDENT_AMBULATORY_CARE_PROVIDER_SITE_OTHER): Payer: Medicare Other | Admitting: Family Medicine

## 2021-10-09 ENCOUNTER — Ambulatory Visit (INDEPENDENT_AMBULATORY_CARE_PROVIDER_SITE_OTHER): Payer: Medicare Other

## 2021-10-09 ENCOUNTER — Encounter: Payer: Self-pay | Admitting: Family Medicine

## 2021-10-09 ENCOUNTER — Other Ambulatory Visit: Payer: Self-pay

## 2021-10-09 VITALS — BP 98/40 | HR 76 | Temp 97.1°F | Resp 18 | Ht 66.0 in | Wt 154.4 lb

## 2021-10-09 DIAGNOSIS — I10 Essential (primary) hypertension: Secondary | ICD-10-CM

## 2021-10-09 DIAGNOSIS — M818 Other osteoporosis without current pathological fracture: Secondary | ICD-10-CM | POA: Diagnosis not present

## 2021-10-09 DIAGNOSIS — E782 Mixed hyperlipidemia: Secondary | ICD-10-CM | POA: Diagnosis not present

## 2021-10-09 DIAGNOSIS — J9611 Chronic respiratory failure with hypoxia: Secondary | ICD-10-CM | POA: Diagnosis not present

## 2021-10-09 DIAGNOSIS — Z23 Encounter for immunization: Secondary | ICD-10-CM

## 2021-10-09 DIAGNOSIS — E1169 Type 2 diabetes mellitus with other specified complication: Secondary | ICD-10-CM | POA: Diagnosis not present

## 2021-10-09 DIAGNOSIS — F411 Generalized anxiety disorder: Secondary | ICD-10-CM

## 2021-10-09 DIAGNOSIS — Z9981 Dependence on supplemental oxygen: Secondary | ICD-10-CM | POA: Diagnosis not present

## 2021-10-09 DIAGNOSIS — D6869 Other thrombophilia: Secondary | ICD-10-CM | POA: Diagnosis not present

## 2021-10-09 DIAGNOSIS — I5032 Chronic diastolic (congestive) heart failure: Secondary | ICD-10-CM | POA: Diagnosis not present

## 2021-10-09 DIAGNOSIS — I482 Chronic atrial fibrillation, unspecified: Secondary | ICD-10-CM | POA: Diagnosis not present

## 2021-10-09 DIAGNOSIS — I700 Atherosclerosis of aorta: Secondary | ICD-10-CM | POA: Diagnosis not present

## 2021-10-09 DIAGNOSIS — Z95818 Presence of other cardiac implants and grafts: Secondary | ICD-10-CM

## 2021-10-09 DIAGNOSIS — D649 Anemia, unspecified: Secondary | ICD-10-CM

## 2021-10-09 DIAGNOSIS — J449 Chronic obstructive pulmonary disease, unspecified: Secondary | ICD-10-CM

## 2021-10-09 LAB — POCT UA - MICROALBUMIN: Microalbumin Ur, POC: NEGATIVE mg/L

## 2021-10-09 NOTE — Progress Notes (Signed)
Subjective:  Patient ID: Paul [name], male    DOB: June 07, 1946  Age: 75 y.o. MRN: [id number]  Chief Complaint  Patient presents with   Diabetes   Hypertension   Hyperlipidemia    Diabetes Pertinent negatives for hypoglycemia include no dizziness, headaches or nervousness/anxiousness. Pertinent negatives for diabetes include no chest pain, no fatigue and no weakness.  Hypertension Associated symptoms include shortness of breath. Pertinent negatives include no chest pain, headaches or palpitations.  Hyperlipidemia Associated symptoms include shortness of breath. Pertinent negatives include no chest pain or myalgias.     Diabetes:  Complications: none Glucose checking: no Glucose logs: no Hypoglycemia: no  Most recent A1C:6.1 Current medications:  none Last Eye Exam: summer 2022 Foot checks: daily  Hyperlipidemia: Current medications: on lipitor 40 mg once daily.   Hypertension/Atrial fibrillation: Current medications: eliquis, bisoprolol 5 mg qd, diltiazem 60 mg one qid prn, Lasix 20 mg qd. On plavix. Changed from eliquis due to high risk for bleeds/falls. Has had issues with hematuria per cardiology notes.   COPD/Chronic respiratory failure with hypoxia: ON breztri 2 puffs bid, prednisone 5 mg once daily, mucinex 600 mg one bid, Oxygen 2 -2.5 L at night and when ambulatory.   Iron deficiency anemia: questioning whether he needs to continue ferrous sulfate 325 mg twice daily.  GERD: on protonix 40 mg once daily. GAD: on zoloft 50 mg once daily. Denies depression or anxiety.  Osteoporosis: on prolia every 6 months and vitamin d 2000 U daily  Diet: Fairly healthy. Exercise: walks   Current Outpatient Medications on File Prior to Visit  Medication Sig Dispense Refill   albuterol (PROAIR HFA) 108 (90 Base) MCG/ACT inhaler Inhale 2 puffs into the lungs every 6 (six) hours as needed for wheezing or shortness of breath.      albuterol (PROVENTIL) (2.5 MG/3ML) 0.083%  nebulizer solution Take 3 mLs (2.5 mg total) by nebulization in the morning and at bedtime. (Patient taking differently: Take 2.5 mg by nebulization every 6 (six) hours as needed for wheezing or shortness of breath.) 75 mL 6   Ascorbic Acid (VITAMIN C) 1000 MG tablet Take 1,000 mg by mouth daily.     atorvastatin (LIPITOR) 40 MG tablet TAKE 1 TABLET BY MOUTH ONCE DAILY AFTER SUPPER 90 tablet 0   bisoprolol (ZEBETA) 5 MG tablet Take 1 tablet (5 mg total) by mouth daily. 90 tablet 3   Budeson-Glycopyrrol-Formoterol (BREZTRI AEROSPHERE) 160-9-4.8 MCG/ACT AERO Inhale 2 puffs into the lungs 2 (two) times daily. 32.1 g 3   calcium carbonate (OS-CAL) 600 MG TABS tablet Take 600 mg by mouth daily.     Cholecalciferol (VITAMIN D) 50 MCG (2000 UT) CAPS Take 2,000 Units by mouth daily.     clopidogrel (PLAVIX) 75 MG tablet Take 1 tablet (75 mg total) by mouth daily. 90 tablet 1   denosumab (PROLIA) 60 MG/ML SOSY injection Inject 60 mg into the skin every 6 (six) months.     diltiazem (CARDIZEM) 60 MG tablet Take 1 tablet (60 mg total) by mouth 4 (four) times daily as needed. (Patient taking differently: Take 60 mg by mouth 4 (four) times daily as needed (a.fib).) 30 tablet prn   ferrous sulfate 325 (65 FE) MG tablet Take 325 mg by mouth 2 (two) times daily with a meal.     furosemide (LASIX) 40 MG tablet Take 1 tablet (40 mg total) by mouth daily. 90 tablet 3   guaiFENesin (MUCINEX) 600 MG 12 hr tablet Take 600 mg  by mouth daily.     loratadine (CLARITIN) 10 MG tablet Take 10 mg by mouth in the morning.     Multiple Vitamin (MULTIVITAMIN WITH MINERALS) TABS tablet Take 1 tablet by mouth daily.     Niacinamide-Zn-Cu-Methfo-Se-Cr (NICOTINAMIDE PO) Take 1 tablet by mouth in the morning and at bedtime.     OXYGEN Inhale 2-2.5 L/min into the lungs See admin instructions. Inhale 2 L/min of oxygen into the lungs at bedtime and when ambulatory     pantoprazole (PROTONIX) 40 MG tablet Take 1 tablet (40 mg total) by  mouth daily. 90 tablet 1   predniSONE (DELTASONE) 5 MG tablet Take 1 tablet (5 mg total) by mouth daily with breakfast. 90 tablet 3   sertraline (ZOLOFT) 50 MG tablet Take 1 tablet by mouth once daily 90 tablet 1   sodium chloride (OCEAN) 0.65 % nasal spray Place 1 spray into the nose as needed for congestion.     No current facility-administered medications on file prior to visit.   Past Medical History:  Diagnosis Date   Anemia    Aortic atherosclerosis (HCC)    Atrial fibrillation (HCC)    Back pain    COPD (chronic obstructive pulmonary disease) ([place])    COVID-19 12/24/2020   Diabetes mellitus without complication (HCC)    Elevated PSA    GAD (generalized anxiety disorder)    GERD (gastroesophageal reflux disease)    High cholesterol    Hypertension    Lingular pneumonia 01/05/2021   See cxr 01/06/20 rx with zpak/ cefipime x 5 days and then developed purulent sputum again 01/12/21 so rx levcaquin 500 mg daily x 7 days then return for cxr before more    Lung nodule < 6cm on CT 05/29/2016   Osteoporosis    Oxygen deficiency    Pneumonia    January 2022   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 Quentin Ore with a 20 mm Watchman FLX device   Vertebral compression fracture ([place]) 05/29/2019   T8 compression fracture noted on CT scan 05/28/2019 inpatient with chronic steroid dependent COPD - rx calcitonin nasal spray rx per PCP    Past Surgical History:  Procedure Laterality Date   CATARACT EXTRACTION Right    LEFT ATRIAL APPENDAGE OCCLUSION N/A 08/02/2021   Procedure: LEFT ATRIAL APPENDAGE OCCLUSION;  Surgeon: Sherren Mocha, MD;  Location: [facility];  Service: Cardiovascular;  Laterality: N/A;   SKIN SURGERY     shoulders and chest   TEE WITHOUT CARDIOVERSION N/A 08/02/2021   Procedure: TRANSESOPHAGEAL ECHOCARDIOGRAM (TEE);  Surgeon: Sherren Mocha, MD;  Location: [facility];  Service: Cardiovascular;  Laterality: N/A;   TEE  WITHOUT CARDIOVERSION N/A 09/13/2021   Procedure: TRANSESOPHAGEAL ECHOCARDIOGRAM (TEE);  Surgeon: Sanda Klein, MD;  Location: [facility];  Service: Cardiovascular;  Laterality: N/A;    Family History  Problem Relation Age of Onset   Heart disease Brother    Heart disease Mother    Heart disease Father    Cancer Paternal Uncle    Social History   Socioeconomic History   Marital status: Married    Spouse name: Not on file   Number of children: 3   Years of education: Not on file   Highest education level: Not on file  Occupational History   Occupation: retired    Comment: truck driver  Tobacco Use   Smoking status: Former    Packs/day: 2.00    Years:  52.00    Pack years: 104.00    Types: Cigarettes    Quit date: 02/03/2009    Years since quitting: 12.7   Smokeless tobacco: Never   Tobacco comments:    Counseled to remain smoke free  Vaping Use   Vaping Use: Never used  Substance and Sexual Activity   Alcohol use: Not Currently    Alcohol/week: 0.0 standard drinks    Comment: occ   Drug use: No   Sexual activity: Not on file  Other Topics Concern   Not on file  Social History Narrative   Originally from [place]. Previously worked driving a Restaurant manager, fast food. No pets currently. No mold exposure. 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       wears sunscreen, brushes and flosses daily, see's dentist bi-annually, has smoke/carbon monoxide detectors, wears a seatbelt and practices gun safety      Social Determinants of Health   Financial Resource Strain: Not on file  Food Insecurity: No Food Insecurity   Worried About Charity fundraiser in the Last Year: Never true   Ran Out of Food in the Last Year: Never true  Transportation Needs: No Transportation Needs   Lack of Transportation (Medical): No   Lack of Transportation (Non-Medical): No  Physical Activity: Not on file  Stress: Not on file  Social  Connections: Not on file    Review of Systems  Constitutional:  Negative for appetite change, fatigue and fever.  HENT:  Negative for congestion, ear pain, sinus pressure and sore throat.   Eyes:  Negative for pain.  Respiratory:  Positive for cough and shortness of breath. Negative for chest tightness and wheezing.   Cardiovascular:  Negative for chest pain and palpitations.  Gastrointestinal:  Negative for abdominal pain, constipation, diarrhea, nausea and vomiting.  Genitourinary:  Negative for dysuria and hematuria.  Musculoskeletal:  Negative for arthralgias, back pain, joint swelling and myalgias.  Skin:  Negative for rash.  Neurological:  Negative for dizziness, weakness and headaches.  Psychiatric/Behavioral:  Negative for dysphoric mood. The patient is not nervous/anxious.     Objective:  BP (!) 98/40   Pulse 76   Temp (!) 97.1 F (36.2 C)   Resp 18   Ht 5\' 6"  (1.676 m)   Wt 154 lb 6.4 oz (70 kg)   BMI 24.92 kg/m   BP/Weight 10/09/2021 09/13/2021 08/26/91  Systolic BP 98 330 076  Diastolic BP 40 54 60  Wt. (Lbs) 154.4 150.2 156  BMI 24.92 24.24 25.18    Physical Exam Vitals reviewed.  Constitutional:      Appearance: Normal appearance.  Neck:     Vascular: No carotid bruit.  Cardiovascular:     Rate and Rhythm: Normal rate and regular rhythm.     Pulses: Normal pulses.     Heart sounds: Normal heart sounds.  Pulmonary:     Effort: Pulmonary effort is normal.     Breath sounds: Normal breath sounds. No wheezing, rhonchi or rales.  Abdominal:     General: Bowel sounds are normal.     Palpations: Abdomen is soft.     Tenderness: There is no abdominal tenderness.  Neurological:     Mental Status: He is alert and oriented to person, place, and time.  Psychiatric:        Mood and Affect: Mood normal.        Behavior: Behavior normal.  Diabetic Foot Exam - Simple   Simple Foot Form Diabetic Foot exam was performed with the following findings: Yes  10/09/2021  2:15 PM  Visual Inspection No deformities, no ulcerations, no other skin breakdown bilaterally: Yes Sensation Testing Intact to touch and monofilament testing bilaterally: Yes Pulse Check Posterior Tibialis and Dorsalis pulse intact bilaterally: Yes Comments      Lab Results  Component Value Date   WBC 11.0 (H) 10/05/2021   HGB 12.8 (L) 10/05/2021   HCT 41.3 10/05/2021   PLT 311 10/05/2021   GLUCOSE 114 (H) 10/05/2021   CHOL 144 10/05/2021   TRIG 180 (H) 10/05/2021   HDL 44 10/05/2021   LDLCALC 70 10/05/2021   ALT 29 10/05/2021   AST 26 10/05/2021   NA 142 10/05/2021   K 4.4 10/05/2021   CL 101 10/05/2021   CREATININE 0.97 10/05/2021   BUN 15 10/05/2021   CO2 27 10/05/2021   TSH 2.539 03/22/2019   INR 1.4 (H) 12/24/2020   HGBA1C 6.1 (H) 10/05/2021   MICROALBUR Negative 10/09/2021      Assessment & Plan:   Problem List Items Addressed This Visit       Cardiovascular and Mediastinum   Atherosclerosis of aorta ([place])    The current medical regimen is effective;  continue present plan and medications. Continue lipitor 40 mg once daily.      Essential hypertension    Well controlled. Low normal, but pt is asymptomatic Continue bisoprolol 5 mg qd and Lasix 20 mg qd.  Decrease lasix to 20 mg every other day x 2 weeks and if tolerating may stop lasix.       Chronic diastolic CHF (congestive heart failure) (HCC)    Decrease lasix 20 mg every other day x 2 weeks then discontinue if tolerating it well and no edema is occurring.      Atrial fibrillation ([place])    On plavix until January following Watchman procedure, then plan is to change to aspirin daily. Continue bisoprolol 5 mg qd, diltiazem 60 mg one qid prn        Respiratory   Chronic respiratory failure with hypoxia (HCC)    ON nocturnal oxygen and ambulatory prn. 2-2.5 L.      COPD GOLD  III     Overview reviewed.  Continue Breztri and albuterol prn.  On prednisone 5 mg once daily.   Follows with Dr. Melvyn Novas.  On oxygen at night.         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  Well controlled.       Relevant Orders   POCT UA - Microalbumin (Completed)     Musculoskeletal and Integument   Other osteoporosis without current pathological fracture    Continue prolia, calcium carbonate 600 mg daily. Vitamin D 2000 U daily.         Hematopoietic and Hemostatic   Acquired thrombophilia ([place])    On plavix for atrial fibrillation.        Other   Chronic anemia    Improved. Continue iron supplement      HLD (hyperlipidemia)    The current medical regimen is effective;  continue present plan and medications. Continue lipitor 40 mg once daily.       GAD (generalized anxiety disorder)    The current medical regimen is effective;  continue present plan and medications. Continue zoloft 50 mg once daily.  Not taking xanax.  Oxygen dependent    Continue oxygen at night.       Presence of Watchman left atrial appendage closure device   Other Visit Diagnoses     Need for influenza vaccination    -  Primary   Relevant Orders   Flu Vaccine QUAD High Dose(Fluad) (Completed)     .  No orders of the defined types were placed in this encounter.   Orders Placed This Encounter  Procedures   Flu Vaccine QUAD High Dose(Fluad)   POCT UA - Microalbumin      Follow-up: Return in about 4 months (around 02/09/2022) for awv.  An After Visit Summary was printed and given to the patient.     I,Lauren M Auman,acting as a scribe for [name], MD.,have documented all relevant documentation on the behalf of [name], MD,as directed by  [name], MD while in the presence of [name], MD.    [name], MD [place] [phone]

## 2021-10-09 NOTE — Assessment & Plan Note (Signed)
Not on medicine.  Well controlled.

## 2021-10-09 NOTE — Assessment & Plan Note (Signed)
On plavix until January following Watchman procedure, then plan is to change to aspirin daily. Continue bisoprolol 5 mg qd, diltiazem 60 mg one qid prn

## 2021-10-09 NOTE — Assessment & Plan Note (Signed)
Well controlled. Low normal, but pt is asymptomatic Continue bisoprolol 5 mg qd and Lasix 20 mg qd.  Decrease lasix to 20 mg every other day x 2 weeks and if tolerating may stop lasix.

## 2021-10-09 NOTE — Assessment & Plan Note (Signed)
Continue oxygen at night.   

## 2021-10-09 NOTE — Assessment & Plan Note (Signed)
The current medical regimen is effective;  continue present plan and medications. Continue lipitor 40 mg once daily.

## 2021-10-09 NOTE — Patient Instructions (Signed)
Decrease lasix to 20 mg every other day.  If tolerating change well May stop in 2 weeks.

## 2021-10-09 NOTE — Assessment & Plan Note (Signed)
Decrease lasix 20 mg every other day x 2 weeks then discontinue if tolerating it well and no edema is occurring.

## 2021-10-09 NOTE — Assessment & Plan Note (Signed)
Improved. Continue iron supplement

## 2021-10-09 NOTE — Assessment & Plan Note (Signed)
The current medical regimen is effective;  continue present plan and medications. Continue zoloft 50 mg once daily.  Not taking xanax.

## 2021-10-09 NOTE — Assessment & Plan Note (Signed)
Overview reviewed.  Continue Breztri and albuterol prn.  On prednisone 5 mg once daily.  Follows with Dr. Melvyn Novas.  On oxygen at night.

## 2021-10-09 NOTE — Assessment & Plan Note (Signed)
Continue prolia, calcium carbonate 600 mg daily. Vitamin D 2000 U daily.

## 2021-10-22 ENCOUNTER — Encounter: Payer: Self-pay | Admitting: Family Medicine

## 2021-10-22 DIAGNOSIS — I10 Essential (primary) hypertension: Secondary | ICD-10-CM | POA: Diagnosis not present

## 2021-10-22 DIAGNOSIS — J449 Chronic obstructive pulmonary disease, unspecified: Secondary | ICD-10-CM | POA: Diagnosis not present

## 2021-10-22 DIAGNOSIS — I480 Paroxysmal atrial fibrillation: Secondary | ICD-10-CM | POA: Diagnosis not present

## 2021-10-22 DIAGNOSIS — E119 Type 2 diabetes mellitus without complications: Secondary | ICD-10-CM | POA: Diagnosis not present

## 2021-10-22 DIAGNOSIS — D6869 Other thrombophilia: Secondary | ICD-10-CM | POA: Insufficient documentation

## 2021-10-22 NOTE — Assessment & Plan Note (Signed)
On plavix for atrial fibrillation.

## 2021-10-22 NOTE — Assessment & Plan Note (Signed)
ON nocturnal oxygen and ambulatory prn. 2-2.5 L.

## 2021-10-26 ENCOUNTER — Telehealth: Payer: Self-pay

## 2021-10-26 NOTE — Chronic Care Management (AMB) (Signed)
Chronic Care Management Pharmacy Assistant   Name: Paul [name]  MRN: [id number] DOB: [birth year]   Reason for Encounter: Disease State call for HTN    Recent office visits:  10/09/21 [name] MD. Seen for DM, HTN, HLD. D/C Alprazolam 0.25 mg 2 times daily.   09/06/21 Meredith Mody RN. Seen for Osteoporosis. Injected Denosumab 60 mg.   Recent consult visits:  09/13/21 Cardiology) Telephone Encounter. Instructed patient to STOP ELIQUIS and ASPIRIN after 09/16/2021 doses. He will also stop omeprazole.Instruct him to START PLAVIX 75 mg daily and pantoprazole 40 mg once daily on 09/17/2021.  09/11/21 (Pulmonology) Telephone Encounter. Started on Prednisone 5 mg daily.   09/06/21 (Cardiology) Angelena Form PA-C. Seen for A-Fib. Changed Ascorbic Acid from 500 mg to 1000 mg daily.  Hospital visits:  Medication Reconciliation was completed by comparing discharge summary, patient's EMR and Pharmacy list, and upon discussion with patient.  Admitted to the hospital on 09/13/21 due to A-Fib. Discharge date was 09/13/21. Discharged from [facility].    No medication changes   Medications: Outpatient Encounter Medications as of 10/26/2021  Medication Sig   albuterol (PROAIR HFA) 108 (90 Base) MCG/ACT inhaler Inhale 2 puffs into the lungs every 6 (six) hours as needed for wheezing or shortness of breath.    albuterol (PROVENTIL) (2.5 MG/3ML) 0.083% nebulizer solution Take 3 mLs (2.5 mg total) by nebulization in the morning and at bedtime. (Patient taking differently: Take 2.5 mg by nebulization every 6 (six) hours as needed for wheezing or shortness of breath.)   Ascorbic Acid (VITAMIN C) 1000 MG tablet Take 1,000 mg by mouth daily.   atorvastatin (LIPITOR) 40 MG tablet TAKE 1 TABLET BY MOUTH ONCE DAILY AFTER SUPPER   bisoprolol (ZEBETA) 5 MG tablet Take 1 tablet (5 mg total) by mouth daily.   Budeson-Glycopyrrol-Formoterol (BREZTRI AEROSPHERE) 160-9-4.8 MCG/ACT AERO  Inhale 2 puffs into the lungs 2 (two) times daily.   calcium carbonate (OS-CAL) 600 MG TABS tablet Take 600 mg by mouth daily.   Cholecalciferol (VITAMIN D) 50 MCG (2000 UT) CAPS Take 2,000 Units by mouth daily.   clopidogrel (PLAVIX) 75 MG tablet Take 1 tablet (75 mg total) by mouth daily.   denosumab (PROLIA) 60 MG/ML SOSY injection Inject 60 mg into the skin every 6 (six) months.   diltiazem (CARDIZEM) 60 MG tablet Take 1 tablet (60 mg total) by mouth 4 (four) times daily as needed. (Patient taking differently: Take 60 mg by mouth 4 (four) times daily as needed (a.fib).)   ferrous sulfate 325 (65 FE) MG tablet Take 325 mg by mouth 2 (two) times daily with a meal.   furosemide (LASIX) 40 MG tablet Take 1 tablet (40 mg total) by mouth daily.   guaiFENesin (MUCINEX) 600 MG 12 hr tablet Take 600 mg by mouth daily.   loratadine (CLARITIN) 10 MG tablet Take 10 mg by mouth in the morning.   Multiple Vitamin (MULTIVITAMIN WITH MINERALS) TABS tablet Take 1 tablet by mouth daily.   Niacinamide-Zn-Cu-Methfo-Se-Cr (NICOTINAMIDE PO) Take 1 tablet by mouth in the morning and at bedtime.   OXYGEN Inhale 2-2.5 L/min into the lungs See admin instructions. Inhale 2 L/min of oxygen into the lungs at bedtime and when ambulatory   pantoprazole (PROTONIX) 40 MG tablet Take 1 tablet (40 mg total) by mouth daily.   predniSONE (DELTASONE) 5 MG tablet Take 1 tablet (5 mg total) by mouth daily with breakfast.   sertraline (ZOLOFT) 50 MG tablet Take  1 tablet by mouth once daily   sodium chloride (OCEAN) 0.65 % nasal spray Place 1 spray into the nose as needed for congestion.   No facility-administered encounter medications on file as of 10/26/2021.     Recent Office Vitals: BP Readings from Last 3 Encounters:  10/09/21 (!) 98/40  09/13/21 (!) 124/54  09/06/21 120/60   Pulse Readings from Last 3 Encounters:  10/09/21 76  09/13/21 65  09/06/21 70    Wt Readings from Last 3 Encounters:  10/09/21 154 lb 6.4 oz  (70 kg)  09/13/21 150 lb 3.2 oz (68.1 kg)  09/06/21 156 lb (70.8 kg)     Kidney Function Lab Results  Component Value Date/Time   CREATININE 0.97 10/05/2021 08:16 AM   CREATININE 0.77 09/06/2021 02:36 PM   GFRNONAA >60 08/03/2021 12:24 AM   GFRAA 107 11/20/2020 12:02 PM    BMP Latest Ref Rng & Units 10/05/2021 09/06/2021 08/03/2021  Glucose 70 - 99 mg/dL 114(H) 112(H) 106(H)  BUN 8 - 27 mg/dL 15 11 15   Creatinine 0.76 - 1.27 mg/dL 0.97 0.77 0.90  BUN/Creat Ratio 10 - 24 15 14  -  Sodium 134 - 144 mmol/L 142 139 138  Potassium 3.5 - 5.2 mmol/L 4.4 4.5 4.6  Chloride 96 - 106 mmol/L 101 96 102  CO2 20 - 29 mmol/L 27 29 29   Calcium 8.6 - 10.2 mg/dL 9.6 9.4 8.7(L)     Current antihypertensive regimen:  Bisoprolol 5 mg daily Furosemide 40 mg daily  Diltiazem 60 mg 4 times daily prn   Patient verbally confirms he is taking the above medications as directed. Yes  How often are you checking your Blood Pressure? daily  he checks his blood pressure in the morning before taking his medication.  Current home BP readings: 10/26/21 116/78  Wrist or arm cuff: Arm  Caffeine intake:No caffeine  Salt intake:Limited  OTC medications including pseudoephedrine or NSAIDs? Pt stated not taking   Any readings above 180/120? No  What recent interventions/DTPs have been made by any provider to improve Blood Pressure control since last CPP Visit: Pt stated no changes   Any recent hospitalizations or ED visits since last visit with CPP? No  What diet changes have been made to improve Blood Pressure Control?  Pt has cut down on a lot of his starches and eating more healthy  What exercise is being done to improve your Blood Pressure Control?  Pt goes to the gym everyday   Adherence Review: Is the patient currently on ACE/ARB medication? Yes Does the patient have >5 day gap between last estimated fill dates? No   Care Gaps: Last annual wellness visit? None noted  Star Rating Drugs:   Medication:  Last Fill: Day Supply Bisoprolol   10/07/21 [place], [place] Pharmacist Assistant  [phone]

## 2021-10-30 ENCOUNTER — Ambulatory Visit: Payer: Medicare Other | Admitting: Cardiology

## 2021-10-31 NOTE — Addendum Note (Signed)
Addended by: Karle Barr on: 10/31/2021 02:39 PM   Modules accepted: Level of Service

## 2021-11-05 ENCOUNTER — Encounter: Payer: Self-pay | Admitting: Family Medicine

## 2021-11-05 ENCOUNTER — Ambulatory Visit (INDEPENDENT_AMBULATORY_CARE_PROVIDER_SITE_OTHER): Payer: Medicare Other | Admitting: Family Medicine

## 2021-11-05 ENCOUNTER — Other Ambulatory Visit: Payer: Self-pay

## 2021-11-05 VITALS — BP 110/58 | HR 69 | Temp 98.0°F | Ht 66.0 in | Wt 150.0 lb

## 2021-11-05 DIAGNOSIS — M8000XD Age-related osteoporosis with current pathological fracture, unspecified site, subsequent encounter for fracture with routine healing: Secondary | ICD-10-CM

## 2021-11-05 DIAGNOSIS — M545 Low back pain, unspecified: Secondary | ICD-10-CM | POA: Diagnosis not present

## 2021-11-05 NOTE — Progress Notes (Signed)
Acute Office Visit  Subjective:    Patient ID: Paul [name], male    DOB: [birth year], 75 y.o.   MRN: [id number]  Chief Complaint  Patient presents with   Low back Pain    Patient's wife fell this morning and patient went to pick her up and heard a popping noise in his lower back.    HPI Patient is in today for Low back pain/ popping sound. Patient stated that his wife fell this morning and that when he went to pick her up he bent down and his lower back made a popping sound.  Past Medical History:  Diagnosis Date   Anemia    Aortic atherosclerosis (HCC)    Atrial fibrillation (HCC)    Back pain    COPD (chronic obstructive pulmonary disease) ([place])    COVID-19 12/24/2020   Diabetes mellitus without complication (HCC)    Elevated PSA    GAD (generalized anxiety disorder)    GERD (gastroesophageal reflux disease)    High cholesterol    Hypertension    Lingular pneumonia 01/05/2021   See cxr 01/06/20 rx with zpak/ cefipime x 5 days and then developed purulent sputum again 01/12/21 so rx levcaquin 500 mg daily x 7 days then return for cxr before more    Lung nodule < 6cm on CT 05/29/2016   Osteoporosis    Oxygen deficiency    Pneumonia    January 2022   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 Quentin Ore with a 20 mm Watchman FLX device   Vertebral compression fracture ([place]) 05/29/2019   T8 compression fracture noted on CT scan 05/28/2019 inpatient with chronic steroid dependent COPD - rx calcitonin nasal spray rx per PCP     Past Surgical History:  Procedure Laterality Date   CATARACT EXTRACTION Right    LEFT ATRIAL APPENDAGE OCCLUSION N/A 08/02/2021   Procedure: LEFT ATRIAL APPENDAGE OCCLUSION;  Surgeon: Sherren Mocha, MD;  Location: [facility];  Service: Cardiovascular;  Laterality: N/A;   SKIN SURGERY     shoulders and chest   TEE WITHOUT CARDIOVERSION N/A 08/02/2021   Procedure: TRANSESOPHAGEAL ECHOCARDIOGRAM (TEE);   Surgeon: Sherren Mocha, MD;  Location: [facility];  Service: Cardiovascular;  Laterality: N/A;   TEE WITHOUT CARDIOVERSION N/A 09/13/2021   Procedure: TRANSESOPHAGEAL ECHOCARDIOGRAM (TEE);  Surgeon: Sanda Klein, MD;  Location: [facility];  Service: Cardiovascular;  Laterality: N/A;    Family History  Problem Relation Age of Onset   Heart disease Brother    Heart disease Mother    Heart disease Father    Cancer Paternal Uncle     Social History   Socioeconomic History   Marital status: Married    Spouse name: Not on file   Number of children: 3   Years of education: Not on file   Highest education level: Not on file  Occupational History   Occupation: retired    Comment: truck driver  Tobacco Use   Smoking status: Former    Packs/day: 2.00    Years: 52.00    Pack years: 104.00    Types: Cigarettes    Quit date: 02/03/2009    Years since quitting: 12.7   Smokeless tobacco: Never   Tobacco comments:    Counseled to remain smoke free  Vaping Use   Vaping Use: Never used  Substance and Sexual Activity   Alcohol use: Not Currently    Alcohol/week: 0.0 standard drinks  Comment: occ   Drug use: No   Sexual activity: Not on file  Other Topics Concern   Not on file  Social History Narrative   Originally from [facility]. Previously worked driving a Restaurant manager, fast food. No pets currently. No mold exposure. 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       wears sunscreen, brushes and flosses daily, see's dentist bi-annually, has smoke/carbon monoxide detectors, wears a seatbelt and practices gun safety      Social Determinants of Health   Financial Resource Strain: Not on file  Food Insecurity: No Food Insecurity   Worried About Charity fundraiser in the Last Year: Never true   Ran Out of Food in the Last Year: Never true  Transportation Needs: No Transportation Needs   Lack of Transportation (Medical): No    Lack of Transportation (Non-Medical): No  Physical Activity: Not on file  Stress: Not on file  Social Connections: Not on file  Intimate Partner Violence: Not on file    Outpatient Medications Prior to Visit  Medication Sig Dispense Refill   albuterol (PROAIR HFA) 108 (90 Base) MCG/ACT inhaler Inhale 2 puffs into the lungs every 6 (six) hours as needed for wheezing or shortness of breath.      albuterol (PROVENTIL) (2.5 MG/3ML) 0.083% nebulizer solution Take 3 mLs (2.5 mg total) by nebulization in the morning and at bedtime. (Patient taking differently: Take 2.5 mg by nebulization every 6 (six) hours as needed for wheezing or shortness of breath.) 75 mL 6   Ascorbic Acid (VITAMIN C) 1000 MG tablet Take 1,000 mg by mouth daily.     atorvastatin (LIPITOR) 40 MG tablet TAKE 1 TABLET BY MOUTH ONCE DAILY AFTER SUPPER 90 tablet 0   bisoprolol (ZEBETA) 5 MG tablet Take 1 tablet (5 mg total) by mouth daily. 90 tablet 3   Budeson-Glycopyrrol-Formoterol (BREZTRI AEROSPHERE) 160-9-4.8 MCG/ACT AERO Inhale 2 puffs into the lungs 2 (two) times daily. 32.1 g 3   calcium carbonate (OS-CAL) 600 MG TABS tablet Take 600 mg by mouth daily.     Cholecalciferol (VITAMIN D) 50 MCG (2000 UT) CAPS Take 2,000 Units by mouth daily.     clopidogrel (PLAVIX) 75 MG tablet Take 1 tablet (75 mg total) by mouth daily. 90 tablet 1   denosumab (PROLIA) 60 MG/ML SOSY injection Inject 60 mg into the skin every 6 (six) months.     diltiazem (CARDIZEM) 60 MG tablet Take 1 tablet (60 mg total) by mouth 4 (four) times daily as needed. (Patient taking differently: Take 60 mg by mouth 4 (four) times daily as needed (a.fib).) 30 tablet prn   ferrous sulfate 325 (65 FE) MG tablet Take 325 mg by mouth 2 (two) times daily with a meal.     furosemide (LASIX) 40 MG tablet Take 1 tablet (40 mg total) by mouth daily. 90 tablet 3   guaiFENesin (MUCINEX) 600 MG 12 hr tablet Take 600 mg by mouth daily.     loratadine (CLARITIN) 10 MG tablet  Take 10 mg by mouth in the morning.     Multiple Vitamin (MULTIVITAMIN WITH MINERALS) TABS tablet Take 1 tablet by mouth daily.     Niacinamide-Zn-Cu-Methfo-Se-Cr (NICOTINAMIDE PO) Take 1 tablet by mouth in the morning and at bedtime.     OXYGEN Inhale 2-2.5 L/min into the lungs See admin instructions. Inhale 2 L/min of oxygen into the lungs at bedtime and when ambulatory  pantoprazole (PROTONIX) 40 MG tablet Take 1 tablet (40 mg total) by mouth daily. 90 tablet 1   predniSONE (DELTASONE) 5 MG tablet Take 1 tablet (5 mg total) by mouth daily with breakfast. 90 tablet 3   sertraline (ZOLOFT) 50 MG tablet Take 1 tablet by mouth once daily 90 tablet 1   sodium chloride (OCEAN) 0.65 % nasal spray Place 1 spray into the nose as needed for congestion.     No facility-administered medications prior to visit.    Allergies  Allergen Reactions   Daliresp [Roflumilast] Diarrhea and Nausea And Vomiting   Alendronate Anxiety and Other (See Comments)    Jittery/nervous   Levaquin [Levofloxacin] Palpitations    Made the B/P fluctuate and heart raced    Review of Systems  Constitutional:  Negative for appetite change, chills, fatigue and fever.  HENT:  Negative for congestion, ear pain and sore throat.   Respiratory:  Negative for cough and shortness of breath.   Cardiovascular:  Negative for chest pain and leg swelling.  Gastrointestinal:  Negative for abdominal pain, constipation, diarrhea, nausea and vomiting.  Genitourinary:  Negative for dysuria and frequency.  Musculoskeletal:  Positive for back pain (Low back pain/ Patient picked up wife from fall and heard popping noise in lower back.). Negative for arthralgias and myalgias.  Neurological:  Negative for dizziness and headaches.  Psychiatric/Behavioral:  Negative for dysphoric mood. The patient is not nervous/anxious.       Objective:    Physical Exam Vitals reviewed.  Constitutional:      Appearance: Normal appearance.   Musculoskeletal:        General: Tenderness (lumbar paraspinal muscles. Nontender over all vertebrae.) present.  Neurological:     Mental Status: He is alert.    BP (!) 110/58 (BP Location: Right Arm, Patient Position: Sitting)   Pulse 69   Temp 98 F (36.7 C) (Temporal)   Ht 5' 6"  (1.676 m)   Wt 150 lb (68 kg)   SpO2 95%   BMI 24.21 kg/m  Wt Readings from Last 3 Encounters:  11/05/21 150 lb (68 kg)  10/09/21 154 lb 6.4 oz (70 kg)  09/13/21 150 lb 3.2 oz (68.1 kg)    Health Maintenance Due  Topic Date Due   OPHTHALMOLOGY EXAM  Never done   Hepatitis C Screening  Never done   TETANUS/TDAP  Never done   Zoster Vaccines- Shingrix (2 of 2) 05/30/2014    There are no preventive care reminders to display for this patient.   Lab Results  Component Value Date   TSH 2.539 03/22/2019   Lab Results  Component Value Date   WBC 11.0 (H) 10/05/2021   HGB 12.8 (L) 10/05/2021   HCT 41.3 10/05/2021   MCV 81 10/05/2021   PLT 311 10/05/2021   Lab Results  Component Value Date   NA 142 10/05/2021   K 4.4 10/05/2021   CO2 27 10/05/2021   GLUCOSE 114 (H) 10/05/2021   BUN 15 10/05/2021   CREATININE 0.97 10/05/2021   BILITOT <0.2 10/05/2021   ALKPHOS 110 10/05/2021   AST 26 10/05/2021   ALT 29 10/05/2021   PROT 6.2 10/05/2021   ALBUMIN 4.4 10/05/2021   CALCIUM 9.6 10/05/2021   ANIONGAP 7 08/03/2021   EGFR 81 10/05/2021   Lab Results  Component Value Date   CHOL 144 10/05/2021   Lab Results  Component Value Date   HDL 44 10/05/2021   Lab Results  Component Value Date   LDLCALC  70 10/05/2021   Lab Results  Component Value Date   TRIG 180 (H) 10/05/2021   Lab Results  Component Value Date   CHOLHDL 3.3 10/05/2021   Lab Results  Component Value Date   HGBA1C 6.1 (H) 10/05/2021         Assessment & Plan:   Problem List Items Addressed This Visit       Musculoskeletal and Integument   Age-related osteoporosis with current pathological fracture     High risk for recurrent compression fracture.         Other   Lumbar back pain - Primary    Rest. Ice. Tylenol.  Called back next day with increasing pain.  Ordered lumbar xray. Found compression fracture.      Relevant Orders   DG Lumbar Spine Complete     I,Lauren M Auman,acting as a scribe for [name], MD.,have documented all relevant documentation on the behalf of [name], MD,as directed by  [name], MD while in the presence of [name], MD.     Oleta Mouse, CMA

## 2021-11-07 ENCOUNTER — Telehealth: Payer: Self-pay

## 2021-11-07 NOTE — Telephone Encounter (Signed)
Wife calling as pt was instructed if he was not feeling better in couple days to return call. Pt is not better and is requesting xray talked about.   Royce Macadamia, [place] 11/07/21 11:57 AM

## 2021-11-08 DIAGNOSIS — M47816 Spondylosis without myelopathy or radiculopathy, lumbar region: Secondary | ICD-10-CM | POA: Diagnosis not present

## 2021-11-08 DIAGNOSIS — M545 Low back pain, unspecified: Secondary | ICD-10-CM | POA: Diagnosis not present

## 2021-11-08 NOTE — Telephone Encounter (Signed)
Wife calling this morning for update. Made her aware order was sent and they can obtain xray when they can.   Royce Macadamia, [place] 11/08/21 8:22 AM

## 2021-11-09 ENCOUNTER — Other Ambulatory Visit: Payer: Self-pay | Admitting: Family Medicine

## 2021-11-09 MED ORDER — TRAMADOL HCL 50 MG PO TABS
50.0000 mg | ORAL_TABLET | Freq: Four times a day (QID) | ORAL | 0 refills | Status: AC | PRN
Start: 2021-11-09 — End: 2021-11-14

## 2021-11-09 NOTE — Telephone Encounter (Signed)
Paul [name], wife, calling as pt did get xray yesterday. Pt is in a lot of pain, when he has sharp pain he become SOB for that duration of time. Paul [name] had leftover tramadol 50 mg, she gave one to pt along with heat. This did improve his pain. She is requesting either tramadol or a muscle relaxer to help ease pain.   Xray report is not back as of now.   Paul [name], [place] 11/09/21 9:17 AM

## 2021-11-12 ENCOUNTER — Other Ambulatory Visit: Payer: Self-pay | Admitting: Family Medicine

## 2021-11-12 ENCOUNTER — Telehealth: Payer: Self-pay

## 2021-11-12 DIAGNOSIS — S32010D Wedge compression fracture of first lumbar vertebra, subsequent encounter for fracture with routine healing: Secondary | ICD-10-CM

## 2021-11-12 MED ORDER — TIZANIDINE HCL 2 MG PO TABS: 2.0000 mg | ORAL_TABLET | Freq: Four times a day (QID) | ORAL | 0 refills | Status: DC | PRN

## 2021-11-12 NOTE — Telephone Encounter (Signed)
Sent tizanidine. Ordered a mri of lumbar spine.  Refer to interventional radiology.  Caroline d/w pt.  Kc

## 2021-11-12 NOTE — Telephone Encounter (Signed)
[name] called to report that Paul [name]'s back pain has not improved.  He wants to proceed with referral.  Will make Dr. Tobie Poet aware.

## 2021-11-13 ENCOUNTER — Other Ambulatory Visit: Payer: Self-pay | Admitting: Family Medicine

## 2021-11-13 DIAGNOSIS — S32010D Wedge compression fracture of first lumbar vertebra, subsequent encounter for fracture with routine healing: Secondary | ICD-10-CM

## 2021-11-13 NOTE — Progress Notes (Unsigned)
T

## 2021-11-14 ENCOUNTER — Other Ambulatory Visit: Payer: Self-pay | Admitting: Family Medicine

## 2021-11-17 DIAGNOSIS — M545 Low back pain, unspecified: Secondary | ICD-10-CM | POA: Insufficient documentation

## 2021-11-17 NOTE — Assessment & Plan Note (Signed)
Rest. Ice. Tylenol.  Called back next day with increasing pain.  Ordered lumbar xray. Found compression fracture.

## 2021-11-17 NOTE — Assessment & Plan Note (Signed)
High risk for recurrent compression fracture.

## 2021-11-19 DIAGNOSIS — R29890 Loss of height: Secondary | ICD-10-CM | POA: Diagnosis not present

## 2021-11-19 DIAGNOSIS — M47816 Spondylosis without myelopathy or radiculopathy, lumbar region: Secondary | ICD-10-CM | POA: Diagnosis not present

## 2021-11-19 DIAGNOSIS — S32010D Wedge compression fracture of first lumbar vertebra, subsequent encounter for fracture with routine healing: Secondary | ICD-10-CM | POA: Diagnosis not present

## 2021-11-20 ENCOUNTER — Telehealth: Payer: Self-pay | Admitting: *Deleted

## 2021-11-20 ENCOUNTER — Other Ambulatory Visit: Payer: Self-pay | Admitting: Family Medicine

## 2021-11-20 ENCOUNTER — Other Ambulatory Visit: Payer: Self-pay | Admitting: Physician Assistant

## 2021-11-20 ENCOUNTER — Other Ambulatory Visit: Payer: Self-pay

## 2021-11-20 DIAGNOSIS — S32010A Wedge compression fracture of first lumbar vertebra, initial encounter for closed fracture: Secondary | ICD-10-CM

## 2021-11-20 DIAGNOSIS — S32010D Wedge compression fracture of first lumbar vertebra, subsequent encounter for fracture with routine healing: Secondary | ICD-10-CM

## 2021-11-20 NOTE — Telephone Encounter (Signed)
   [facility] Pre-operative Risk Assessment    Patient Name: Paul [name]  DOB: 1946-04-11 MRN: [id number]  HEARTCARE STAFF:  - IMPORTANT!!!!!! Under Visit Info/Reason for Call, type in Other and utilize the format Clearance MM/DD/YY or Clearance TBD. Do not use dashes or single digits. - Please review there is not already an duplicate clearance open for this procedure. - If request is for dental extraction, please clarify the # of teeth to be extracted. - If the patient is currently at the dentist's office, call Pre-Op Callback Staff (MA/nurse) to input urgent request.  - If the patient is not currently in the dentist office, please route to the Pre-Op pool.  Request for surgical clearance:  What type of surgery is being performed?  LUMBAR KYPHOPLASTY  When is this surgery scheduled?  TBD  What type of clearance is required (medical clearance vs. Pharmacy clearance to hold med vs. Both)?  BOTH  Are there any medications that need to be held prior to surgery and how long?  PLAVIX X'S [place] name and name of physician performing surgery?  [facility]  What is the office phone number?  [phone]   7.   What is the office fax number?  [phone]  8.   Anesthesia type (None, local, MAC, general) ?     Jeanann Lewandowsky 11/20/2021, 3:15 PM  _________________________________________________________________   (provider comments below)

## 2021-11-21 DIAGNOSIS — I480 Paroxysmal atrial fibrillation: Secondary | ICD-10-CM | POA: Diagnosis not present

## 2021-11-21 DIAGNOSIS — E119 Type 2 diabetes mellitus without complications: Secondary | ICD-10-CM | POA: Diagnosis not present

## 2021-11-21 DIAGNOSIS — J449 Chronic obstructive pulmonary disease, unspecified: Secondary | ICD-10-CM | POA: Diagnosis not present

## 2021-11-21 DIAGNOSIS — I10 Essential (primary) hypertension: Secondary | ICD-10-CM | POA: Diagnosis not present

## 2021-11-21 NOTE — Telephone Encounter (Signed)
Paul [name] from [facility] called back and said appt is tomorrow for assessment and not today. Paul [name] will call back tomorrow with an update.

## 2021-11-21 NOTE — Telephone Encounter (Signed)
I called and s/w [place] at [facility] in regard to pt's upcoming procedure. Per [place], she states the pt has appt today with them and they will determine if the pt is able to have the procedure Kyphoplasty. If it is determined that the pt is able to proceed with the procedure, per [place] they are looking to proceed with date of procedure 11/28/21. Per Tanzania the doctors who do the procedure are only there certain days. I assured [place] that I will give a heads up to pre op provider today of our conversation today and will await a call back later today or tomorrow with update.

## 2021-11-21 NOTE — Telephone Encounter (Signed)
Pre-op covering staff, can we please find out when surgery is planned for? Patient has an appointment with Dr. Marlou Porch on 12/04/2021. If surgery is planned for after this, then Dr. Marlou Porch can address pre-op risk assessment at that time. However, if they are wanting to do surgery before this, we will need to go ahead and call him and see how he is doing.  Thank so much! Satoshi Kalas

## 2021-11-22 ENCOUNTER — Ambulatory Visit
Admission: RE | Admit: 2021-11-22 | Discharge: 2021-11-22 | Disposition: A | Discharge status: Home / Self Care | Payer: Medicare Other | Source: Ambulatory Visit | Attending: Family Medicine | Admitting: Family Medicine

## 2021-11-22 ENCOUNTER — Other Ambulatory Visit: Payer: Self-pay | Admitting: Family Medicine

## 2021-11-22 DIAGNOSIS — M545 Low back pain, unspecified: Secondary | ICD-10-CM

## 2021-11-22 DIAGNOSIS — S32010A Wedge compression fracture of first lumbar vertebra, initial encounter for closed fracture: Secondary | ICD-10-CM | POA: Diagnosis not present

## 2021-11-22 NOTE — Telephone Encounter (Signed)
I s/w [place] at [facility] and informed we have the pt cleared. Will fax over clearance notes with recommendations for Plavix ok to hold 5-7 days. Informed Tanzania that their office will need to call the pt to let him know when to stop his Plavix for procedure. [place] is agreeable to plan of care. I will fax notes with recommendations to fax number listed on clearance, which I confirmed with [place].

## 2021-11-22 NOTE — Telephone Encounter (Signed)
See message below from the pt's wife calling and states Plavix to be held x 6 days. However, please follow the notes from call underneath where Tanzania from [facility] stated hold Plavix x 5 days.    I just s/w Tanzania at [facility] to confirm the hold time for the Plavix. Correct hold time for Plavix is x 5 days per [place]. I assured Tanzania that we are going to do our very best in order to try and not delay procedure for the pt. I informed [place] that once we have a clearance I will update her as well, if not today, then maybe by tomorrow. I will call Tanzania back at her direct ext [phone] at [facility]. [place] thanked me for the call today and the update that we are doing all we can to help the pt.

## 2021-11-22 NOTE — Telephone Encounter (Signed)
    Patient Name: Paul [name]  DOB: October 25, 1946 MRN: [id number]  Primary Cardiologist: Candee Furbish, MD  Chart reviewed as part of pre-operative protocol coverage. Patient was last seen in our office in 08/2021. Patient was contacted today for further pre-op evaluation and reported doing well from a cardiac standpoint. He has chronic dyspnea on exertion with his COPD which is stable. No chest pain, orthopnea/PND, palpitations, dizziness, syncope. His activity is very limited by his severe back pain but he is still able to do light shores around the house including washing dishes and carrying in some groceries (so still able to complete at least 4.0 METS of activity). Given past medical history and time since last visit, based on ACC/AHA guidelines, Paul [name] would be at acceptable risk for the planned procedure without further cardiovascular testing.   Personally discussed with Dr. Marlou Porch about antiplatelet therapy. Patient on Plavix after Watchman procedure in 07/2021. We are almost 4 months out from this procedure. Given patient's significant back pain and clear need for surgery, it is suitable to stop Plavix for 5-7 days prior to surgery per Dr. Marlou Porch. Please restart Plavix as soon as safely possible afterwards.   I will route this recommendation to the requesting party via Epic fax function and remove from pre-op pool.  Please call with questions.  Darreld Mclean, PA-C 11/22/2021, 3:37 PM

## 2021-11-22 NOTE — Telephone Encounter (Signed)
   Pt's wife called, she said pt was checked today by  imaging and was told he does need to hold his blood thinner for 6 days. She would like to know if Dr. Marlou Porch can clear pt because he is in a lot of pain

## 2021-11-22 NOTE — Telephone Encounter (Signed)
Tanzania from [facility] called the office today and updated our office that pt is a candidate to proceed with the upcoming procedure Kyphoplasty. Per [place], the doctor would like to proceed with procedure 11/28/21, as well as pt will need to hold Plavix x 5 days prior to procedure. I will send this back to pre op pool now as a high priority. See previous notes.

## 2021-11-22 NOTE — Consult Note (Signed)
Chief Complaint: Patient was seen in consultation today for L1 compression fracture at the request of Cox,Kirsten  Referring Physician(s): Cox,Kirsten  History of Present Illness: Paul [name] is a 75 y.o. male who presents for evaluation L1 compression fracture with associated pain and decreased mobility.  His symptoms began approximately 2-1/2 weeks ago on Monday the 14th of November.  His wife fluid recently had her hip replaced slipped and fell to the floor.  While he was trying to help lift her off the floor he felt and heard a loud pop in the middle of his back.  By the next day, he had severe back pain which only became worse over the coming days.   He rates his pain at an 8/10.  The pain is disrupting his lifestyle as he can no longer plate golf or in joint other activities.  His Roland Morris disability questionnaire scores a 5/24.] He had a prior compression fracture in the past which was also quite painful.  He struggled for months with this pain and discomfort before the fracture finally healed.  He feels very strongly that he does not want to go through a prolonged course of pain and slow healing again.  His wife remains frail and recovering from her hip replacement and requires his assistance.  It is very hard for him to care for his wife with his continuous back pain.  He is currently taking pain medication and muscle relaxers which takes the edge off but is insufficient to control his pain.    He denies lower extremity weakness, paresthesias or changes in bowel or bladder continence.    Past Medical History:  Diagnosis Date   Anemia    Aortic atherosclerosis (HCC)    Atrial fibrillation (HCC)    Back pain    COPD (chronic obstructive pulmonary disease) ([place])    COVID-19 12/24/2020   Diabetes mellitus without complication (HCC)    Elevated PSA    GAD (generalized anxiety disorder)    GERD (gastroesophageal reflux disease)    High cholesterol    Hypertension     Lingular pneumonia 01/05/2021   See cxr 01/06/20 rx with zpak/ cefipime x 5 days and then developed purulent sputum again 01/12/21 so rx levcaquin 500 mg daily x 7 days then return for cxr before more    Lung nodule < 6cm on CT 05/29/2016   Osteoporosis    Oxygen deficiency    Pneumonia    January 2022   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 Quentin Ore with a 20 mm Watchman FLX device   Vertebral compression fracture ([place]) 05/29/2019   T8 compression fracture noted on CT scan 05/28/2019 inpatient with chronic steroid dependent COPD - rx calcitonin nasal spray rx per PCP     Past Surgical History:  Procedure Laterality Date   CATARACT EXTRACTION Right    LEFT ATRIAL APPENDAGE OCCLUSION N/A 08/02/2021   Procedure: LEFT ATRIAL APPENDAGE OCCLUSION;  Surgeon: Sherren Mocha, MD;  Location: [facility];  Service: Cardiovascular;  Laterality: N/A;   SKIN SURGERY     shoulders and chest   TEE WITHOUT CARDIOVERSION N/A 08/02/2021   Procedure: TRANSESOPHAGEAL ECHOCARDIOGRAM (TEE);  Surgeon: Sherren Mocha, MD;  Location: [facility];  Service: Cardiovascular;  Laterality: N/A;   TEE WITHOUT CARDIOVERSION N/A 09/13/2021   Procedure: TRANSESOPHAGEAL ECHOCARDIOGRAM (TEE);  Surgeon: Sanda Klein, MD;  Location: [facility];  Service: Cardiovascular;  Laterality: N/A;  Allergies: Daliresp [roflumilast], Alendronate, and Levaquin [levofloxacin]  Medications: Prior to Admission medications   Medication Sig Start Date End Date Taking? Authorizing Provider  albuterol (PROAIR HFA) 108 (90 Base) MCG/ACT inhaler Inhale 2 puffs into the lungs every 6 (six) hours as needed for wheezing or shortness of breath.     [provider]  albuterol (PROVENTIL) (2.5 MG/3ML) 0.083% nebulizer solution Take 3 mLs (2.5 mg total) by nebulization in the morning and at bedtime. Patient taking differently: Take 2.5 mg by nebulization every 6 (six) hours as  needed for wheezing or shortness of breath. 05/09/20   Parrett, Fonnie Mu, NP  Ascorbic Acid (VITAMIN C) 1000 MG tablet Take 1,000 mg by mouth daily.    [provider]  atorvastatin (LIPITOR) 40 MG tablet TAKE 1 TABLET BY MOUTH ONCE DAILY AFTER SUPPER 08/29/21   Cox, Kirsten, MD  bisoprolol (ZEBETA) 5 MG tablet Take 1 tablet (5 mg total) by mouth daily. 01/19/21   Jerline Pain, MD  Budeson-Glycopyrrol-Formoterol (BREZTRI AEROSPHERE) 160-9-4.8 MCG/ACT AERO Inhale 2 puffs into the lungs 2 (two) times daily. 03/26/21   Cox, Elnita Maxwell, MD  calcium carbonate (OS-CAL) 600 MG TABS tablet Take 600 mg by mouth daily.    [provider]  Cholecalciferol (VITAMIN D) 50 MCG (2000 UT) CAPS Take 2,000 Units by mouth daily.    [provider]  clopidogrel (PLAVIX) 75 MG tablet Take 1 tablet (75 mg total) by mouth daily. 09/13/21 03/12/22  Eileen Stanford, PA-C  denosumab (PROLIA) 60 MG/ML SOSY injection Inject 60 mg into the skin every 6 (six) months.    [provider]  diltiazem (CARDIZEM) 60 MG tablet Take 1 tablet (60 mg total) by mouth 4 (four) times daily as needed. Patient taking differently: Take 60 mg by mouth 4 (four) times daily as needed (a.fib). 01/19/21   Jerline Pain, MD  ferrous sulfate 325 (65 FE) MG tablet Take 325 mg by mouth 2 (two) times daily with a meal.    [provider]  furosemide (LASIX) 40 MG tablet Take 1 tablet (40 mg total) by mouth daily. 01/19/21   Jerline Pain, MD  guaiFENesin (MUCINEX) 600 MG 12 hr tablet Take 600 mg by mouth daily.    [provider]  loratadine (CLARITIN) 10 MG tablet Take 10 mg by mouth in the morning.    [provider]  Multiple Vitamin (MULTIVITAMIN WITH MINERALS) TABS tablet Take 1 tablet by mouth daily.    [provider]  Niacinamide-Zn-Cu-Methfo-Se-Cr (NICOTINAMIDE PO) Take 1 tablet by mouth in the morning and at bedtime.    [provider]  OXYGEN Inhale 2-2.5 L/min into the  lungs See admin instructions. Inhale 2 L/min of oxygen into the lungs at bedtime and when ambulatory    [provider]  pantoprazole (PROTONIX) 40 MG tablet Take 1 tablet (40 mg total) by mouth daily. 09/13/21 03/12/22  Eileen Stanford, PA-C  predniSONE (DELTASONE) 5 MG tablet Take 1 tablet (5 mg total) by mouth daily with breakfast. 09/11/21   Tanda Rockers, MD  sertraline (ZOLOFT) 50 MG tablet Take 1 tablet by mouth once daily 06/28/21   Cox, Kirsten, MD  sodium chloride (OCEAN) 0.65 % nasal spray Place 1 spray into the nose as needed for congestion.    [provider]  tiZANidine (ZANAFLEX) 2 MG tablet Take 1 tablet (2 mg total) by mouth every 6 (six) hours as needed for muscle spasms. 11/12/21   [name], MD  Family History  Problem Relation Age of Onset   Heart disease Brother    Heart disease Mother    Heart disease Father    Cancer Paternal Uncle     Social History   Socioeconomic History   Marital status: Married    Spouse name: Not on file   Number of children: 3   Years of education: Not on file   Highest education level: Not on file  Occupational History   Occupation: retired    Comment: truck driver  Tobacco Use   Smoking status: Former    Packs/day: 2.00    Years: 52.00    Pack years: 104.00    Types: Cigarettes    Quit date: 02/03/2009    Years since quitting: 12.8   Smokeless tobacco: Never   Tobacco comments:    Counseled to remain smoke free  Vaping Use   Vaping Use: Never used  Substance and Sexual Activity   Alcohol use: Not Currently    Alcohol/week: 0.0 standard drinks    Comment: occ   Drug use: No   Sexual activity: Not on file  Other Topics Concern   Not on file  Social History Narrative   Originally from [place]. Previously worked driving a Restaurant manager, fast food. No pets currently. No mold exposure. 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       wears sunscreen,  brushes and flosses daily, see's dentist bi-annually, has smoke/carbon monoxide detectors, wears a seatbelt and practices gun safety      Social Determinants of Health   Financial Resource Strain: Not on file  Food Insecurity: No Food Insecurity   Worried About Charity fundraiser in the Last Year: Never true   Ran Out of Food in the Last Year: Never true  Transportation Needs: No Transportation Needs   Lack of Transportation (Medical): No   Lack of Transportation (Non-Medical): No  Physical Activity: Not on file  Stress: Not on file  Social Connections: Not on file      Review of Systems: A 12 point ROS discussed and pertinent positives are indicated in the HPI above.  All other systems are negative.  Review of Systems  Vital Signs: BP (!) 155/61   Pulse 82   Temp 97.9 F (36.6 C) (Oral)   Resp (!) 22   Wt 65.8 kg   SpO2 97% Comment: on 2L [place]; pts baseline  BMI 23.40 kg/m   Physical Exam Constitutional:      Appearance: Normal appearance.  HENT:     Head: Normocephalic and atraumatic.  Eyes:     General: No scleral icterus. Cardiovascular:     Rate and Rhythm: Normal rate.  Pulmonary:     Effort: Pulmonary effort is normal.  Abdominal:     General: There is no distension.     Tenderness: There is no abdominal tenderness.  Musculoskeletal:       Back:     Comments: TTP at L1 spinous process.  Skin:    General: Skin is warm and dry.  Neurological:     Mental Status: He is alert and oriented to person, place, and time.  Psychiatric:        Mood and Affect: Mood normal.        Behavior: Behavior normal.      Imaging: No results found.  Labs:  CBC: Recent Labs    05/10/21 0000 07/03/21 3710 09/06/21 1436 10/05/21  0816  WBC 14.4* 9.9 10.3 11.0*  HGB 12.2* 12.4* 11.7* 12.8*  HCT 38.5 40.3 36.7* 41.3  PLT 352 307 263 311    COAGS: Recent Labs    12/24/20 1025  INR 1.4*    BMP: Recent Labs    01/15/21 0423 01/30/21 2122  03/01/21 0843 03/21/21 0944 05/10/21 0000 07/03/21 0812 08/03/21 0024 09/06/21 1436 10/05/21 0816  NA 140 140   < > 140   < > 141 138 139 142  K 4.3 3.8   < > 4.0   < > 4.4 4.6 4.5 4.4  CL 98 98   < > 98   < > 99 102 96 101  CO2 27 30   < > 32   < > 27 29 29 27   GLUCOSE 193* 136*   < > 108*   < > 98 106* 112* 114*  BUN 23 17   < > 12   < > 17 15 11 15   CALCIUM 8.8* 8.9   < > 9.4   < > 9.7 8.7* 9.4 9.6  CREATININE 1.01 0.68   < > 0.85   < > 0.90 0.90 0.77 0.97  GFRNONAA >60 >60  --  >60  --   --  >60  --   --    < > = values in this interval not displayed.    LIVER FUNCTION TESTS: Recent Labs    03/21/21 0944 05/10/21 0000 07/03/21 0812 10/05/21 0816  BILITOT 0.5 <0.2 0.2 <0.2  AST 27 21 24 26   ALT 27 20 25 29   ALKPHOS 59 82 73 110  PROT 7.2 6.3 6.5 6.2  ALBUMIN 3.9 4.3 4.2 4.4    TUMOR MARKERS: No results for input(s): AFPTM, CEA, CA199, CHROMGRNA in the last 8760 hours.  Assessment and Plan:  Very pleasant 75 year old gentleman with a subacute L1 compression fracture with significant and persistent symptoms.  He is an excellent candidate for percutaneous cement augmentation with kyphoplasty.  We discussed the natural history of vertebral fractures and the fact that they will eventually heal on their own with conservative management but the healing process may be prolonged.  He understands and has been down that road before with a prior fracture and feel strongly that he needs pain relief more expeditiously so that he can care for his wife.   Please schedule for L1 cement augmentation with kyphoplasty as soon as possible.   Thank you for this interesting consult.  I greatly enjoyed meeting Paul [name] and look forward to participating in their care.  A copy of this report was sent to the requesting provider on this date.  Electronically Signed: Criselda Peaches 11/22/2021, 3:32 PM   I spent a total of  40 Minutes  in face to face in clinical consultation,  greater than 50% of which was counseling/coordinating care for L1 compression fracture.

## 2021-11-27 ENCOUNTER — Other Ambulatory Visit: Payer: Self-pay

## 2021-11-27 ENCOUNTER — Ambulatory Visit (INDEPENDENT_AMBULATORY_CARE_PROVIDER_SITE_OTHER): Payer: Medicare Other | Admitting: Adult Health

## 2021-11-27 ENCOUNTER — Encounter: Payer: Self-pay | Admitting: Adult Health

## 2021-11-27 DIAGNOSIS — I5032 Chronic diastolic (congestive) heart failure: Secondary | ICD-10-CM | POA: Diagnosis not present

## 2021-11-27 DIAGNOSIS — J9611 Chronic respiratory failure with hypoxia: Secondary | ICD-10-CM

## 2021-11-27 DIAGNOSIS — J449 Chronic obstructive pulmonary disease, unspecified: Secondary | ICD-10-CM

## 2021-11-27 MED ORDER — PREDNISONE 5 MG PO TABS: 5.0000 mg | ORAL_TABLET | Freq: Every day | ORAL | 3 refills | Status: DC

## 2021-11-27 MED ORDER — BREZTRI AEROSPHERE 160-9-4.8 MCG/ACT IN AERO: 2.0000 | INHALATION_SPRAY | Freq: Two times a day (BID) | RESPIRATORY_TRACT | 0 refills | Status: DC

## 2021-11-27 NOTE — Assessment & Plan Note (Signed)
Continue on Oxygen 2l/m with activity and At bedtime   

## 2021-11-27 NOTE — Patient Instructions (Addendum)
Continue on BREZTRI 2 puffs Twice daily, rinse after use.  Continue on Prednisone 5mg  daily  Mucinex Twice daily  As needed  Cough/congestion  Flutter valve Three times a day  .  Albuterol Neb or inhaler as needed  Wear Oxygen 2l/m  with activity and At bedtime   to maintain O2 sats >88-90%.  Activity as tolerated.  Please contact office for sooner follow up if symptoms do not improve or worsen or seek emergency care  Follow up with Dr. Melvyn Novas  Or Ethyn Schetter NP in 6 months and As needed

## 2021-11-27 NOTE — Progress Notes (Signed)
As per  @Patient  ID: Paul [name], male    DOB: 12-24-1945, 74 y.o.   MRN: [id number]  Chief Complaint  Patient presents with   Follow-up    Referring provider: [name], MD  HPI: 75 year old male former smoker quit in 2010 followed for severe Gold 3 COPD and chronic respiratory failure on oxygen.  Patient is currently on recurrent COPD exacerbations and hospitalizations. Medical history significant for A. Fib COVID-19 infection in January 2022 required hospitalization   TEST/EVENTS :  05/29/16: FVC 2.08 L (86%) FEV1 0.83 L (31%) FEV1/FVC 0.40 FEF 25-75 0.30 L (14%) no bronchodilator response TLC 6.56 L (107%) RV 167% ERV 160% DLCO corrected 60% (hemoglobin 10.9) 07/15/13: FVC 2.68 L (60%) FEV1 1.63 L (53%) FEV1/FVC 0.61 FEF 25-75 0.76 L (26%) 02/04/12: FVC 2.18 L (57%) FEV1 1.09 L (36%) FEV1/FVC 0.50 FEF 25-75 0.33 L (11%)     CT sinus February 2021 no acute sinus disease noted CT chest February 2021 emphysematous changes, several old thoracic compression fractures   2D echo July 2020 EF 60-65%,   Palliative care started 2021 (nurse comes every 2 weeks)   11/27/2021 Follow up : COPD , chronic respiratory failure on oxygen Patient presents for a 59-month follow-up.  Patient has underlying severe COPD.  Overall since last visit patient says he has been doing actually better. Feels his breathing is at baseline. Completed pulmonary rehab and feels it really helped, he is going to Gym 5 days a week and walking 1 hr on treadmill. Stamina is improved.  Remains on BREZTRI  He is on chronic steroids with prednisone 5 mg daily. He remains on oxygen  2 L with activity and At bedtime  . Decreased oxygen demands. Not needing oxygen at rest. O2 sats improved .  Influenza vaccine up-to-date.,  COVID-19 vaccine x4. Denies chest pain , orthopnea or edema.    Allergies  Allergen Reactions   Daliresp [Roflumilast] Diarrhea and Nausea And Vomiting   Alendronate Anxiety and Other (See  Comments)    Jittery/nervous   Levaquin [Levofloxacin] Palpitations    Made the B/P fluctuate and heart raced    Immunization History  Administered Date(s) Administered   Fluad Quad(high Dose 65+) 10/02/2020, 10/09/2021   Influenza Split 09/22/2013, 09/01/2015   Influenza Whole 09/04/2011, 09/21/2012   Influenza, High Dose Seasonal PF 09/02/2016, 09/10/2017, 08/31/2018, 09/24/2018, 10/21/2019   Influenza-Unspecified 08/23/2014   Moderna Covid-19 Vaccine Bivalent Booster 57yrs & up 10/09/2021   Moderna SARS-COV2 Booster Vaccination 07/05/2021   Moderna Sars-Covid-2 Vaccination 01/17/2020, 02/14/2020, 10/06/2020   Pneumococcal Conjugate-13 01/11/2014, 12/09/2016   Pneumococcal Polysaccharide-23 08/23/2010, 08/29/2011   Zoster Recombinat (Shingrix) 04/04/2014    Past Medical History:  Diagnosis Date   Anemia    Aortic atherosclerosis (HCC)    Atrial fibrillation (HCC)    Back pain    COPD (chronic obstructive pulmonary disease) ([place])    COVID-19 12/24/2020   Diabetes mellitus without complication (HCC)    Elevated PSA    GAD (generalized anxiety disorder)    GERD (gastroesophageal reflux disease)    High cholesterol    Hypertension    Lingular pneumonia 01/05/2021   See cxr 01/06/20 rx with zpak/ cefipime x 5 days and then developed purulent sputum again 01/12/21 so rx levcaquin 500 mg daily x 7 days then return for cxr before more    Lung nodule < 6cm on CT 05/29/2016   Osteoporosis    Oxygen deficiency    Pneumonia    January 2022  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 Quentin Ore with a 20 mm Watchman FLX device   Vertebral compression fracture ([place]) 05/29/2019   T8 compression fracture noted on CT scan 05/28/2019 inpatient with chronic steroid dependent COPD - rx calcitonin nasal spray rx per PCP     Tobacco History: Social History   Tobacco Use  Smoking Status Former   Packs/day: 2.00   Years: 52.00   Pack years: 104.00    Types: Cigarettes   Quit date: 02/03/2009   Years since quitting: 12.8  Smokeless Tobacco Never  Tobacco Comments   Counseled to remain smoke free   Counseling given: Not Answered Tobacco comments: Counseled to remain smoke free   Outpatient Medications Prior to Visit  Medication Sig Dispense Refill   albuterol (PROAIR HFA) 108 (90 Base) MCG/ACT inhaler Inhale 2 puffs into the lungs every 6 (six) hours as needed for wheezing or shortness of breath.      albuterol (PROVENTIL) (2.5 MG/3ML) 0.083% nebulizer solution Take 3 mLs (2.5 mg total) by nebulization in the morning and at bedtime. (Patient taking differently: Take 2.5 mg by nebulization every 6 (six) hours as needed for wheezing or shortness of breath.) 75 mL 6   Ascorbic Acid (VITAMIN C) 1000 MG tablet Take 1,000 mg by mouth daily.     atorvastatin (LIPITOR) 40 MG tablet TAKE 1 TABLET BY MOUTH ONCE DAILY AFTER SUPPER 90 tablet 0   bisoprolol (ZEBETA) 5 MG tablet Take 1 tablet (5 mg total) by mouth daily. 90 tablet 3   Budeson-Glycopyrrol-Formoterol (BREZTRI AEROSPHERE) 160-9-4.8 MCG/ACT AERO Inhale 2 puffs into the lungs 2 (two) times daily. 32.1 g 3   calcium carbonate (OS-CAL) 600 MG TABS tablet Take 600 mg by mouth daily.     Cholecalciferol (VITAMIN D) 50 MCG (2000 UT) CAPS Take 2,000 Units by mouth daily.     clopidogrel (PLAVIX) 75 MG tablet Take 1 tablet (75 mg total) by mouth daily. 90 tablet 1   denosumab (PROLIA) 60 MG/ML SOSY injection Inject 60 mg into the skin every 6 (six) months.     ferrous sulfate 325 (65 FE) MG tablet Take 325 mg by mouth 2 (two) times daily with a meal.     furosemide (LASIX) 40 MG tablet Take 1 tablet (40 mg total) by mouth daily. 90 tablet 3   guaiFENesin (MUCINEX) 600 MG 12 hr tablet Take 600 mg by mouth daily.     loratadine (CLARITIN) 10 MG tablet Take 10 mg by mouth in the morning.     Multiple Vitamin (MULTIVITAMIN WITH MINERALS) TABS tablet Take 1 tablet by mouth daily.      Niacinamide-Zn-Cu-Methfo-Se-Cr (NICOTINAMIDE PO) Take 1 tablet by mouth in the morning and at bedtime.     OXYGEN Inhale 2-2.5 L/min into the lungs See admin instructions. Inhale 2 L/min of oxygen into the lungs at bedtime and when ambulatory     pantoprazole (PROTONIX) 40 MG tablet Take 1 tablet (40 mg total) by mouth daily. 90 tablet 1   sertraline (ZOLOFT) 50 MG tablet Take 1 tablet by mouth once daily 90 tablet 1   predniSONE (DELTASONE) 5 MG tablet Take 1 tablet (5 mg total) by mouth daily with breakfast. 90 tablet 3   diltiazem (CARDIZEM) 60 MG tablet Take 1 tablet (60 mg total) by mouth 4 (four) times daily as needed. (Patient not taking: Reported on 11/27/2021) 30 tablet prn   tiZANidine (ZANAFLEX) 2 MG tablet Take 1  tablet (2 mg total) by mouth every 6 (six) hours as needed for muscle spasms. (Patient not taking: Reported on 11/27/2021) 30 tablet 0   sodium chloride (OCEAN) 0.65 % nasal spray Place 1 spray into the nose as needed for congestion.     No facility-administered medications prior to visit.     Review of Systems:   Constitutional:   No  weight loss, night sweats,  Fevers, chills,  +fatigue, or  lassitude.  HEENT:   No headaches,  Difficulty swallowing,  Tooth/dental problems, or  Sore throat,                No sneezing, itching, ear ache, nasal congestion, post nasal drip,   CV:  No chest pain,  Orthopnea, PND, swelling in lower extremities, anasarca, dizziness, palpitations, syncope.   GI  No heartburn, indigestion, abdominal pain, nausea, vomiting, diarrhea, change in bowel habits, loss of appetite, bloody stools.   Resp:   No excess mucus, no productive cough,  No non-productive cough,  No coughing up of blood.  No change in color of mucus.  No wheezing.  No chest wall deformity  Skin: no rash or lesions.  GU: no dysuria, change in color of urine, no urgency or frequency.  No flank pain, no hematuria   MS:  No joint pain or swelling.  No decreased range of motion.   No back pain.    Physical Exam  BP 128/64 (BP Location: Left Arm, Patient Position: Sitting, Cuff Size: Normal)   Pulse 81   Temp 98.3 F (36.8 C) (Oral)   Ht 5\' 6"  (1.676 m)   Wt 148 lb 12.8 oz (67.5 kg)   SpO2 94% Comment: RA  BMI 24.02 kg/m   GEN: A/Ox3; pleasant , NAD, well nourished    HEENT:  [place]/AT,  NOSE-clear, THROAT-clear, no lesions, no postnasal drip or exudate noted.   NECK:  Supple w/ fair ROM; no JVD; normal carotid impulses w/o bruits; no thyromegaly or nodules palpated; no lymphadenopathy.    RESP  decreased bs in bases  no accessory muscle use, no dullness to percussion  CARD:  RRR, no m/r/g, no peripheral edema, pulses intact, no cyanosis or clubbing.  GI:   Soft & nt; nml bowel sounds; no organomegaly or masses detected.   Musco: Warm bil, no deformities or joint swelling noted.   Neuro: alert, no focal deficits noted.    Skin: Warm, no lesions or rashes    Lab Results:       ProBNP No results found for: PROBNP  Imaging:     PFT Results Latest Ref Rng & Units 05/29/2016  FVC-Pre L 1.92  FVC-Predicted Pre % 52  Pre FEV1/FVC % % 41  FEV1-Pre L 0.78  FEV1-Predicted Pre % 29    No results found for: NITRICOXIDE      Assessment & Plan:   COPD GOLD  III  Compensated on present regimen  Doing well   Plan  Patient Instructions  Continue on BREZTRI 2 puffs Twice daily, rinse after use.  Continue on Prednisone 5mg  daily  Mucinex Twice daily  As needed  Cough/congestion  Flutter valve Three times a day  .  Albuterol Neb or inhaler as needed  Wear Oxygen 2l/m  with activity and At bedtime   to maintain O2 sats >88-90%.  Activity as tolerated.  Please contact office for sooner follow up if symptoms do not improve or worsen or seek emergency care  Follow up with Dr. Melvyn Novas  Or Ermias Tomeo NP in 6 months and As needed        Chronic respiratory failure with hypoxia (HCC) Continue on Oxygen 2l/m with activity and At bedtime     Chronic diastolic CHF (congestive heart failure) (HCC) Appears compensated on regimen   Plan  Patient Instructions  Continue on BREZTRI 2 puffs Twice daily, rinse after use.  Continue on Prednisone 5mg  daily  Mucinex Twice daily  As needed  Cough/congestion  Flutter valve Three times a day  .  Albuterol Neb or inhaler as needed  Wear Oxygen 2l/m  with activity and At bedtime   to maintain O2 sats >88-90%.  Activity as tolerated.  Please contact office for sooner follow up if symptoms do not improve or worsen or seek emergency care  Follow up with Dr. Melvyn Novas  Or Rector Devonshire NP in 6 months and As needed          Rexene Edison, NP 11/27/2021

## 2021-11-27 NOTE — Assessment & Plan Note (Signed)
Appears compensated on regimen   Plan  Patient Instructions  Continue on BREZTRI 2 puffs Twice daily, rinse after use.  Continue on Prednisone 5mg  daily  Mucinex Twice daily  As needed  Cough/congestion  Flutter valve Three times a day  .  Albuterol Neb or inhaler as needed  Wear Oxygen 2l/m  with activity and At bedtime   to maintain O2 sats >88-90%.  Activity as tolerated.  Please contact office for sooner follow up if symptoms do not improve or worsen or seek emergency care  Follow up with Dr. Melvyn Novas  Or Marciano Mundt NP in 6 months and As needed

## 2021-11-27 NOTE — Discharge Instructions (Signed)
Kyphoplasty Post Procedure Discharge Instructions  May resume a regular diet and any medications that you routinely take (including pain medications). However, if you are taking Aspirin or an anticoagulant/blood thinner you will be told when you can resume taking these by the healthcare provider. No driving day of procedure. The day of your procedure take it easy. You may use an ice pack as needed to injection sites on back.  Ice to back 30 minutes on and 30 minutes off, as needed. May remove bandaids tomorrow after taking a shower. Replace daily with a clean bandaid until healed.  Do not lift anything heavier than a milk jug for 1-2 weeks or determined by your physician.  Follow up with your physician in 2 weeks.    Please contact our office at [phone] for the following symptoms or if you have any questions:  Fever greater than 100 degrees Increased swelling, pain, or redness at injection site. Increased back and/or leg pain New numbness or change in symptoms from before the procedure.    Thank you for visiting [facility].   YOU MAY RESUME YOUR PLAVIX AFTER PROCEDURE TODAY.

## 2021-11-27 NOTE — Assessment & Plan Note (Signed)
Compensated on present regimen  Doing well   Plan  Patient Instructions  Continue on BREZTRI 2 puffs Twice daily, rinse after use.  Continue on Prednisone 5mg  daily  Mucinex Twice daily  As needed  Cough/congestion  Flutter valve Three times a day  .  Albuterol Neb or inhaler as needed  Wear Oxygen 2l/m  with activity and At bedtime   to maintain O2 sats >88-90%.  Activity as tolerated.  Please contact office for sooner follow up if symptoms do not improve or worsen or seek emergency care  Follow up with Dr. Melvyn Novas  Or Ell Tiso NP in 6 months and As needed

## 2021-11-28 ENCOUNTER — Ambulatory Visit: Payer: Medicare Other | Admitting: Internal Medicine

## 2021-11-28 ENCOUNTER — Other Ambulatory Visit: Payer: Self-pay | Admitting: Family Medicine

## 2021-11-28 ENCOUNTER — Ambulatory Visit
Admission: RE | Admit: 2021-11-28 | Discharge: 2021-11-28 | Disposition: A | Discharge status: Home / Self Care | Payer: Medicare Other | Source: Ambulatory Visit | Attending: Family Medicine | Admitting: Family Medicine

## 2021-11-28 DIAGNOSIS — M545 Low back pain, unspecified: Secondary | ICD-10-CM

## 2021-11-28 DIAGNOSIS — M4856XA Collapsed vertebra, not elsewhere classified, lumbar region, initial encounter for fracture: Secondary | ICD-10-CM | POA: Diagnosis not present

## 2021-11-28 HISTORY — PX: IR KYPHO EA ADDL LEVEL THORACIC OR LUMBAR: IMG5520

## 2021-11-28 MED ORDER — IOPAMIDOL (ISOVUE-M 200) INJECTION 41%
10.0000 mL | Freq: Once | INTRAMUSCULAR | Status: AC
  Administered 2021-11-28: 10 mL

## 2021-11-28 MED ORDER — SODIUM CHLORIDE 0.9 % IV SOLN: INTRAVENOUS | Status: DC

## 2021-11-28 MED ORDER — CEFAZOLIN SODIUM-DEXTROSE 2-4 GM/100ML-% IV SOLN
2.0000 g | INTRAVENOUS | Status: AC
  Administered 2021-11-28: 2 g via INTRAVENOUS

## 2021-11-28 MED ORDER — FENTANYL CITRATE PF 50 MCG/ML IJ SOSY
25.0000 ug | PREFILLED_SYRINGE | INTRAMUSCULAR | Status: DC | PRN
  Administered 2021-11-28 (×2): 50 ug via INTRAVENOUS
  Administered 2021-11-28: 25 ug via INTRAVENOUS

## 2021-11-28 MED ORDER — KETOROLAC TROMETHAMINE 30 MG/ML IJ SOLN: 30.0000 mg | Freq: Once | INTRAMUSCULAR | Status: DC

## 2021-11-28 MED ORDER — MIDAZOLAM HCL 2 MG/2ML IJ SOLN
1.0000 mg | INTRAMUSCULAR | Status: DC | PRN
  Administered 2021-11-28: 1 mg via INTRAVENOUS
  Administered 2021-11-28 (×2): 0.5 mg via INTRAVENOUS

## 2021-11-28 MED ORDER — KETOROLAC TROMETHAMINE 30 MG/ML IJ SOLN
30.0000 mg | Freq: Once | INTRAMUSCULAR | Status: AC
  Administered 2021-11-28: 30 mg via INTRAMUSCULAR

## 2021-11-28 NOTE — Progress Notes (Signed)
Pt back in nursing recovery area. Pt still drowsy from procedure but will wake up when spoken to. Pt follows commands, talks in complete sentences and has no complaints at this time. Pt will be monitored until discharged by Radiologist.   

## 2021-11-28 NOTE — Progress Notes (Addendum)
Patient states he has been off Plavix for the past week and that he is not on Aspirin. Also, he has been placed on one of our O2 tanks to conserve his.

## 2021-11-30 ENCOUNTER — Telehealth: Payer: Self-pay

## 2021-11-30 NOTE — Telephone Encounter (Signed)
Patient got Spiriva PAP but is on Breztri, not Spiriva. Coordinated with team to get new PAP sent out ASAP

## 2021-12-03 NOTE — Progress Notes (Signed)
Cardiology Office Note:    Date:  12/04/2021   ID:  GID SCHOFFSTALL, DOB 1946/02/15, MRN [id number]  PCP:  [name], MD   [facility] Providers Cardiologist:  Candee Furbish, MD Electrophysiologist:  Vickie Epley, MD     Referring MD: [name], MD    History of Present Illness:    Paul [name] is a 75 y.o. male here for the follow-up of recent watchman device on 08/02/2021 by Dr. Quentin Ore 20 mm, severe COPD paroxysmal atrial fibrillation on chronic anticoagulation, hematuria, followed in pulmonary clinic with severely reduced FEV1, 3 1% in 2017.  He was previously having hematuria falls and skin tears.  Watchman placed.  COVID-19 infection in January 2022 requiring hospitalization.  Pulmonary note from [facility] reviewed on 11/27/2021.  COPD.  He had an admission on 09/13/2021 to the hospital for paroxysmal atrial fibrillation and had TEE performed performed which demonstrated a well-seated watchman device without any leak or thrombus.  Normal LV function.  He will be off of his Plavix in February.  Previously hospitalized with an episode of syncope suspected to be dehydration orthostasis in the setting of pneumonia.  CT of head was negative.  Diltiazem was previously decreased to 120 mg a day.  Lasix was also asked to be pulled back because of syncope.  Monitor results as below overall reassuring.  Seeing their daughter as well.  Today, he is accompanied by his wife and doing well with his Watchman. His device is scheduled to be removed in February. He wonders f the device can cause shortness of breath. However, his wife reports his breathing issues are related to COPD.   Last week, he underwent a kyphoplasty and is recovering well. Of note, he has a bruise along his R arm from when an IV was removed. The bruise is a week old. However, he is not bruising as badly as he used to.  He hurt his back trying to help his wife stand up from a fall in November. During the time, he  heard a loud popping noise from his lower back.   He received the most recent COVID booster and the flu vaccine. They welcomed a new great grandchild last week.   He denies any palpitations, chest pain, lightheadedness, headaches, syncope, orthopnea, PND, lower extremity edema or exertional symptoms.  Past Medical History:  Diagnosis Date   Anemia    Aortic atherosclerosis (HCC)    Atrial fibrillation (HCC)    Back pain    COPD (chronic obstructive pulmonary disease) ([place])    COVID-19 12/24/2020   Diabetes mellitus without complication (HCC)    Elevated PSA    GAD (generalized anxiety disorder)    GERD (gastroesophageal reflux disease)    High cholesterol    Hypertension    Lingular pneumonia 01/05/2021   See cxr 01/06/20 rx with zpak/ cefipime x 5 days and then developed purulent sputum again 01/12/21 so rx levcaquin 500 mg daily x 7 days then return for cxr before more    Lung nodule < 6cm on CT 05/29/2016   Osteoporosis    Oxygen deficiency    Pneumonia    January 2022   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 Quentin Ore with a 20 mm Watchman FLX device   Vertebral compression fracture ([place]) 05/29/2019   T8 compression fracture noted on CT scan 05/28/2019 inpatient with chronic steroid dependent COPD - rx calcitonin nasal spray rx per PCP  Past Surgical History:  Procedure Laterality Date   CATARACT EXTRACTION Right    IR KYPHO EA ADDL LEVEL THORACIC OR LUMBAR  11/28/2021   LEFT ATRIAL APPENDAGE OCCLUSION N/A 08/02/2021   Procedure: LEFT ATRIAL APPENDAGE OCCLUSION;  Surgeon: Sherren Mocha, MD;  Location: [place] [facility];  Service: Cardiovascular;  Laterality: N/A;   SKIN SURGERY     shoulders and chest   TEE WITHOUT CARDIOVERSION N/A 08/02/2021   Procedure: TRANSESOPHAGEAL ECHOCARDIOGRAM (TEE);  Surgeon: Sherren Mocha, MD;  Location: [place] [facility];  Service: Cardiovascular;  Laterality: N/A;   TEE WITHOUT CARDIOVERSION N/A  09/13/2021   Procedure: TRANSESOPHAGEAL ECHOCARDIOGRAM (TEE);  Surgeon: Sanda Klein, MD;  Location: [facility];  Service: Cardiovascular;  Laterality: N/A;    Current Medications: Current Meds  Medication Sig   albuterol (PROAIR HFA) 108 (90 Base) MCG/ACT inhaler Inhale 2 puffs into the lungs every 6 (six) hours as needed for wheezing or shortness of breath.    albuterol (PROVENTIL) (2.5 MG/3ML) 0.083% nebulizer solution Take 3 mLs (2.5 mg total) by nebulization in the morning and at bedtime.   Ascorbic Acid (VITAMIN C) 1000 MG tablet Take 1,000 mg by mouth daily.   atorvastatin (LIPITOR) 40 MG tablet TAKE 1 TABLET BY MOUTH ONCE DAILY AFTER SUPPER   bisoprolol (ZEBETA) 5 MG tablet Take 1 tablet (5 mg total) by mouth daily.   Budeson-Glycopyrrol-Formoterol (BREZTRI AEROSPHERE) 160-9-4.8 MCG/ACT AERO Inhale 2 puffs into the lungs 2 (two) times daily.   Budeson-Glycopyrrol-Formoterol (BREZTRI AEROSPHERE) 160-9-4.8 MCG/ACT AERO Inhale 2 puffs into the lungs in the morning and at bedtime.   calcium carbonate (OS-CAL) 600 MG TABS tablet Take 600 mg by mouth daily.   Cholecalciferol (VITAMIN D) 50 MCG (2000 UT) CAPS Take 2,000 Units by mouth daily.   clopidogrel (PLAVIX) 75 MG tablet Take 1 tablet (75 mg total) by mouth daily.   denosumab (PROLIA) 60 MG/ML SOSY injection Inject 60 mg into the skin every 6 (six) months.   diltiazem (CARDIZEM) 60 MG tablet Take 1 tablet (60 mg total) by mouth 4 (four) times daily as needed.   ferrous sulfate 325 (65 FE) MG tablet Take 325 mg by mouth 2 (two) times daily with a meal.   furosemide (LASIX) 40 MG tablet Take 1 tablet (40 mg total) by mouth daily.   guaiFENesin (MUCINEX) 600 MG 12 hr tablet Take 600 mg by mouth daily.   loratadine (CLARITIN) 10 MG tablet Take 10 mg by mouth in the morning.   Multiple Vitamin (MULTIVITAMIN WITH MINERALS) TABS tablet Take 1 tablet by mouth daily.   Niacinamide-Zn-Cu-Methfo-Se-Cr (NICOTINAMIDE PO) Take 1 tablet by mouth  in the morning and at bedtime.   OXYGEN Inhale 2-2.5 L/min into the lungs See admin instructions. Inhale 2 L/min of oxygen into the lungs at bedtime and when ambulatory   pantoprazole (PROTONIX) 40 MG tablet Take 1 tablet (40 mg total) by mouth daily.   predniSONE (DELTASONE) 5 MG tablet Take 1 tablet (5 mg total) by mouth daily with breakfast.   sertraline (ZOLOFT) 50 MG tablet Take 1 tablet by mouth once daily   tiZANidine (ZANAFLEX) 2 MG tablet Take 1 tablet (2 mg total) by mouth every 6 (six) hours as needed for muscle spasms.     Allergies:   Daliresp [roflumilast], Levaquin [levofloxacin], and Alendronate   Social History   Socioeconomic History   Marital status: Married    Spouse name: Not on file   Number of children: 3   Years of  education: Not on file   Highest education level: Not on file  Occupational History   Occupation: retired    Comment: truck driver  Tobacco Use   Smoking status: Former    Packs/day: 2.00    Years: 52.00    Pack years: 104.00    Types: Cigarettes    Quit date: 02/03/2009    Years since quitting: 12.8   Smokeless tobacco: Never   Tobacco comments:    Counseled to remain smoke free  Vaping Use   Vaping Use: Never used  Substance and Sexual Activity   Alcohol use: Not Currently    Alcohol/week: 0.0 standard drinks    Comment: occ   Drug use: No   Sexual activity: Not on file  Other Topics Concern   Not on file  Social History Narrative   Originally from [place]. Previously worked driving a Restaurant manager, fast food. No pets currently. No mold exposure. 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       wears sunscreen, brushes and flosses daily, see's dentist bi-annually, has smoke/carbon monoxide detectors, wears a seatbelt and practices gun safety      Social Determinants of Health   Financial Resource Strain: Not on file  Food Insecurity: No Food Insecurity   Worried About Sales executive in the Last Year: Never true   Ran Out of Food in the Last Year: Never true  Transportation Needs: No Transportation Needs   Lack of Transportation (Medical): No   Lack of Transportation (Non-Medical): No  Physical Activity: Not on file  Stress: Not on file  Social Connections: Not on file     Family History: The patient's family history includes Cancer in his paternal uncle; Heart disease in his brother, father, and mother.  ROS:   Please see the history of present illness.    (+) Short of breath (+) Bruise (R arm) (+) Lower back pain  All other systems reviewed and are negative.  EKGs/Labs/Other Studies Reviewed:    The following studies were reviewed today: TEE (Endo) 08/02/21 1. Echo guided left atrial appendage closure. No LAA thrombus present.  The IAS was crossed in a mid and inferior location of the IAS. A 20 mm  Watchma FLX device was deployed with maximum diameter 16.8 mm (16% compression). The device was well seated without movement. No device leaks were present. The device remained stable after deployment. A small pericardial effusion was present before the case, and this did not change. A small residual iatrogenic ASD was present due to transseptal puncture and this demonstrated L to R shunting which was benign. No apparent complications visualized. 3D post-processing confirmed no device leak and optimal placement in the LAA.   2. Left ventricular ejection fraction, by estimation, is 60 to 65%. The  left ventricle has normal function. The left ventricle has no regional  wall motion abnormalities.   3. Right ventricular systolic function is normal. The right ventricular  size is normal.   4. No left atrial/left atrial appendage thrombus was detected.   5. A small pericardial effusion is present. The pericardial effusion is  circumferential.   6. The mitral valve is grossly normal. Trivial mitral valve  regurgitation. No evidence of mitral stenosis.   7. The aortic  valve is tricuspid. Aortic valve regurgitation is not  visualized. No aortic stenosis is present.   8. There is mild (Grade II) layered plaque involving the descending aorta  and transverse aorta.  LAA 08/02/21 Successful LAA occlusion with a 20 mm Watchman FLX device Anticoagulation Recommendations: Apixaban 5 mg BID - DC at 45 day TEE if criteria met ASA 81 mg daily - DC at 45 day TEE if criteria met If criteria met at 45 day TEE, transition above therapy to clopidogrel 75 mg daily through 6 months  CT Cardiac Morph 07/17/21 1. The left atrial appendage is a large chicken wing morphology without thrombus. 2. A 27 mm Watchman FLX device is recommended based on the above landing zone measurements (22.1 mm maximum diameter; 18% compression). 3. There is a very proximal lobe that will not be covered by the device. 4. There is no thrombus in the left atrial appendage. 5. A Mid-Mid IAS puncture site is recommended. 6. Optimal deployment angle: RAO 1 CRA 12 7. CAC score of 1046, which is 77th percentile for age-, sex-, and race-matched controls  Echo 07/09/21 1. Left ventricular ejection fraction, by estimation, is 60 to 65%. Left  ventricular ejection fraction by 3D volume is 61 %. The left ventricle has  normal function. The left ventricle has no regional wall motion  abnormalities. Left ventricular diastolic   parameters are consistent with Grade I diastolic dysfunction (impaired  relaxation).   2. Right ventricular systolic function is normal. The right ventricular  size is normal. There is normal pulmonary artery systolic pressure.   3. The mitral valve is normal in structure. No evidence of mitral valve  regurgitation. No evidence of mitral stenosis.   4. The aortic valve is tricuspid. There is mild thickening of the aortic  valve. Aortic valve regurgitation is not visualized. Mild aortic valve  sclerosis is present, with no evidence of aortic valve stenosis.   5. The inferior  vena cava is normal in size with greater than 50%  respiratory variability, suggesting right atrial pressure of 3 mmHg.  Comparison(s): A prior study was performed on 04/02/2020. No significant  change from prior study. Prior images reviewed side by side.   Zio 04/19/21 (14 days) Sinus rhythm with average heart rate of 80 bpm. Rare supraventricular tachycardia runs, paroxysmal atrial tachycardia, fastest lasting 7 beats at 141 bpm. Symptoms of lightheadedness and dizziness are all associated with sinus rhythm. No adverse arrhythmias. No atrial fibrillation, no pauses. Overall reassuring.  EKG: EKG was not ordered today 03/21/2021: sinus rhythm 77 no other abnormalities  Recent Labs: 01/30/2021: B Natriuretic Peptide 90.9 10/05/2021: ALT 29; BUN 15; Creatinine, Ser 0.97; Hemoglobin 12.8; Platelets 311; Potassium 4.4; Sodium 142  Recent Lipid Panel    Component Value Date/Time   CHOL 144 10/05/2021 0816   TRIG 180 (H) 10/05/2021 0816   HDL 44 10/05/2021 0816   CHOLHDL 3.3 10/05/2021 0816   LDLCALC 70 10/05/2021 0816     Risk Assessment/Calculations:      Physical Exam:    VS:  BP (!) 110/50 (BP Location: Left Arm, Patient Position: Sitting, Cuff Size: Normal)   Pulse 76   Ht 5' 6"  (1.676 m)   Wt 149 lb (67.6 kg)   BMI 24.05 kg/m     Wt Readings from Last 3 Encounters:  12/04/21 149 lb (67.6 kg)  11/27/21 148 lb 12.8 oz (67.5 kg)  11/22/21 145 lb (65.8 kg)     GEN:  Well nourished, well developed in no acute distress HEENT: Normal NECK: No JVD; No carotid bruits LYMPHATICS: No lymphadenopathy CARDIAC: RRR, no murmurs, rubs, gallops RESPIRATORY:  Clear to auscultation without rales, wheezing or rhonchi,  distant breath sounds/COPD ABDOMEN: Soft, non-tender, non-distended MUSCULOSKELETAL:  No edema; No deformity, several bruises SKIN: Warm and dry NEUROLOGIC:  Alert and oriented x 3 PSYCHIATRIC:  Normal affect   ASSESSMENT:    1. Paroxysmal atrial fibrillation (HCC)    2. Presence of Watchman left atrial appendage closure device   3. Chronic respiratory failure with hypoxia (HCC)   4. Chronic diastolic CHF (congestive heart failure) (HCC)     PLAN:    Paroxysmal atrial fibrillation (HCC) Overall doing quite well.  Previously we have stopped the diltiazem 120 mg.  Bisoprolol 5 mg a day was continued.  Blood pressures were too low at the time.  Orthostatic type symptoms.  Presence of Watchman left atrial appendage closure device Successful watchman.  TEE post watchman successful.  Reassuring.  Chronic respiratory failure with hypoxia (HCC) Followed closely by Patricia Nettle and pulmonary group.  Has had several hospitalizations for exacerbations.  Chronic diastolic CHF (congestive heart failure) (HCC) Overall well compensated.  No changes made.  Continuing with furosemide 40 mg once a day.  Last creatinine 0.97 hemoglobin 12.8 LDL 70 hemoglobin A1c 6.1 ALT 29   Medication Adjustments/Labs and Tests Ordered: Current medicines are reviewed at length with the patient today.  Concerns regarding medicines are outlined above.  No orders of the defined types were placed in this encounter.  No orders of the defined types were placed in this encounter.   Patient Instructions  Medication Instructions:  The current medical regimen is effective;  continue present plan and medications.  *If you need a refill on your cardiac medications before your next appointment, please call your pharmacy*  Follow-Up: At [facility], you and your health needs are our priority.  As part of our continuing mission to provide you with exceptional heart care, we have created designated Provider Care Teams.  These Care Teams include your primary Cardiologist (physician) and Advanced Practice Providers (APPs -  Physician Assistants and Nurse Practitioners) who all work together to provide you with the care you need, when you need it.  We recommend signing up for the patient  portal called "MyChart".  Sign up information is provided on this After Visit Summary.  MyChart is used to connect with patients for Virtual Visits (Telemedicine).  Patients are able to view lab/test results, encounter notes, upcoming appointments, etc.  Non-urgent messages can be sent to your provider as well.   To learn more about what you can do with MyChart, go to [email or web address].    Your next appointment:   6 month(s)  The format for your next appointment:   In Person  Provider:   Melina Copa, PA-C, Cecilie Kicks, NP, Ermalinda Barrios, PA-C, Christen Bame, NP, or Richardson Dopp, PA-C     Then, Candee Furbish, MD will plan to see you again in 1 year(s).    Thank you for choosing [facility]!!      I,Mykaella Javier,acting as a scribe for Candee Furbish, MD.,have documented all relevant documentation on the behalf of Candee Furbish, MD,as directed by  Candee Furbish, MD while in the presence of Candee Furbish, MD.  I, Candee Furbish, MD, have reviewed all documentation for this visit. The documentation on 12/04/21 for the exam, diagnosis, procedures, and orders are all accurate and complete.   Signed, Candee Furbish, MD  12/04/2021 9:16 AM    [facility]

## 2021-12-04 ENCOUNTER — Other Ambulatory Visit: Payer: Self-pay

## 2021-12-04 ENCOUNTER — Telehealth: Payer: Self-pay

## 2021-12-04 ENCOUNTER — Encounter: Payer: Self-pay | Admitting: Cardiology

## 2021-12-04 ENCOUNTER — Other Ambulatory Visit: Payer: Self-pay | Admitting: Interventional Radiology

## 2021-12-04 ENCOUNTER — Ambulatory Visit (INDEPENDENT_AMBULATORY_CARE_PROVIDER_SITE_OTHER): Payer: Medicare Other | Admitting: Cardiology

## 2021-12-04 DIAGNOSIS — J9611 Chronic respiratory failure with hypoxia: Secondary | ICD-10-CM | POA: Diagnosis not present

## 2021-12-04 DIAGNOSIS — I480 Paroxysmal atrial fibrillation: Secondary | ICD-10-CM

## 2021-12-04 DIAGNOSIS — Z95818 Presence of other cardiac implants and grafts: Secondary | ICD-10-CM | POA: Diagnosis not present

## 2021-12-04 DIAGNOSIS — I5032 Chronic diastolic (congestive) heart failure: Secondary | ICD-10-CM | POA: Diagnosis not present

## 2021-12-04 DIAGNOSIS — S32010S Wedge compression fracture of first lumbar vertebra, sequela: Secondary | ICD-10-CM

## 2021-12-04 NOTE — Patient Instructions (Signed)
Medication Instructions:  The current medical regimen is effective;  continue present plan and medications.  *If you need a refill on your cardiac medications before your next appointment, please call your pharmacy*  Follow-Up: At [facility], you and your health needs are our priority.  As part of our continuing mission to provide you with exceptional heart care, we have created designated Provider Care Teams.  These Care Teams include your primary Cardiologist (physician) and Advanced Practice Providers (APPs -  Physician Assistants and Nurse Practitioners) who all work together to provide you with the care you need, when you need it.  We recommend signing up for the patient portal called "MyChart".  Sign up information is provided on this After Visit Summary.  MyChart is used to connect with patients for Virtual Visits (Telemedicine).  Patients are able to view lab/test results, encounter notes, upcoming appointments, etc.  Non-urgent messages can be sent to your provider as well.   To learn more about what you can do with MyChart, go to [email or web address].    Your next appointment:   6 month(s)  The format for your next appointment:   In Person  Provider:   Melina Copa, PA-C, Cecilie Kicks, NP, Ermalinda Barrios, PA-C, Christen Bame, NP, or Richardson Dopp, PA-C     Then, Candee Furbish, MD will plan to see you again in 1 year(s).    Thank you for choosing [facility]!!

## 2021-12-04 NOTE — Assessment & Plan Note (Signed)
Followed closely by Patricia Nettle and pulmonary group.  Has had several hospitalizations for exacerbations.

## 2021-12-04 NOTE — Progress Notes (Signed)
° ° °  Chronic Care Management Pharmacy Assistant   Name: LLEWELLYN CHOPLIN  MRN: [id number] DOB: [birth year]   Reason for Encounter: Patient Assistance Documentation   12/04/21- I have filled out a new PAP application for Breztri and left a voicemail to fill it out and take it to the provider how prescribes it for him and have them fax it. I have uploaded this to the log to be mailed out.     Medications: Outpatient Encounter Medications as of 12/04/2021  Medication Sig   albuterol (PROAIR HFA) 108 (90 Base) MCG/ACT inhaler Inhale 2 puffs into the lungs every 6 (six) hours as needed for wheezing or shortness of breath.    albuterol (PROVENTIL) (2.5 MG/3ML) 0.083% nebulizer solution Take 3 mLs (2.5 mg total) by nebulization in the morning and at bedtime.   Ascorbic Acid (VITAMIN C) 1000 MG tablet Take 1,000 mg by mouth daily.   atorvastatin (LIPITOR) 40 MG tablet TAKE 1 TABLET BY MOUTH ONCE DAILY AFTER SUPPER   bisoprolol (ZEBETA) 5 MG tablet Take 1 tablet (5 mg total) by mouth daily.   Budeson-Glycopyrrol-Formoterol (BREZTRI AEROSPHERE) 160-9-4.8 MCG/ACT AERO Inhale 2 puffs into the lungs 2 (two) times daily.   Budeson-Glycopyrrol-Formoterol (BREZTRI AEROSPHERE) 160-9-4.8 MCG/ACT AERO Inhale 2 puffs into the lungs in the morning and at bedtime.   calcium carbonate (OS-CAL) 600 MG TABS tablet Take 600 mg by mouth daily.   Cholecalciferol (VITAMIN D) 50 MCG (2000 UT) CAPS Take 2,000 Units by mouth daily.   clopidogrel (PLAVIX) 75 MG tablet Take 1 tablet (75 mg total) by mouth daily.   denosumab (PROLIA) 60 MG/ML SOSY injection Inject 60 mg into the skin every 6 (six) months.   diltiazem (CARDIZEM) 60 MG tablet Take 1 tablet (60 mg total) by mouth 4 (four) times daily as needed.   ferrous sulfate 325 (65 FE) MG tablet Take 325 mg by mouth 2 (two) times daily with a meal.   furosemide (LASIX) 40 MG tablet Take 1 tablet (40 mg total) by mouth daily.   guaiFENesin (MUCINEX) 600 MG 12 hr tablet Take  600 mg by mouth daily.   loratadine (CLARITIN) 10 MG tablet Take 10 mg by mouth in the morning.   Multiple Vitamin (MULTIVITAMIN WITH MINERALS) TABS tablet Take 1 tablet by mouth daily.   Niacinamide-Zn-Cu-Methfo-Se-Cr (NICOTINAMIDE PO) Take 1 tablet by mouth in the morning and at bedtime.   OXYGEN Inhale 2-2.5 L/min into the lungs See admin instructions. Inhale 2 L/min of oxygen into the lungs at bedtime and when ambulatory   pantoprazole (PROTONIX) 40 MG tablet Take 1 tablet (40 mg total) by mouth daily.   predniSONE (DELTASONE) 5 MG tablet Take 1 tablet (5 mg total) by mouth daily with breakfast.   sertraline (ZOLOFT) 50 MG tablet Take 1 tablet by mouth once daily   tiZANidine (ZANAFLEX) 2 MG tablet Take 1 tablet (2 mg total) by mouth every 6 (six) hours as needed for muscle spasms.   No facility-administered encounter medications on file as of 12/04/2021.    [name], [place] Pharmacist Assistant  [phone]

## 2021-12-04 NOTE — Telephone Encounter (Signed)
Phone call to pt to follow up from his kyphoplasty on 11/28/21. Pt reports his pain is "much better" post procedure, ranking it 3/10. Pt reports he is able to move around a little better. Pt denies any signs of infection, redness at the site, draining or fever. Pt has no complaints at this time and will be scheduled for a telephone follow up for Dr. Laurence Ferrari next week. Pt advised to call back if anything were to change or any concerns arise and we will arrange an in person appointment. Pt verbalized understanding.

## 2021-12-04 NOTE — Assessment & Plan Note (Addendum)
Overall well compensated.  No changes made.  Continuing with furosemide 40 mg once a day.  Last creatinine 0.97 hemoglobin 12.8 LDL 70 hemoglobin A1c 6.1 ALT 29

## 2021-12-04 NOTE — Assessment & Plan Note (Signed)
Successful watchman.  TEE post watchman successful.  Reassuring.

## 2021-12-04 NOTE — Assessment & Plan Note (Signed)
Overall doing quite well.  Previously we have stopped the diltiazem 120 mg.  Bisoprolol 5 mg a day was continued.  Blood pressures were too low at the time.  Orthostatic type symptoms.

## 2021-12-06 ENCOUNTER — Other Ambulatory Visit: Payer: Medicare Other

## 2021-12-07 DIAGNOSIS — L570 Actinic keratosis: Secondary | ICD-10-CM | POA: Diagnosis not present

## 2021-12-07 DIAGNOSIS — L821 Other seborrheic keratosis: Secondary | ICD-10-CM | POA: Diagnosis not present

## 2021-12-07 DIAGNOSIS — L814 Other melanin hyperpigmentation: Secondary | ICD-10-CM | POA: Diagnosis not present

## 2021-12-07 DIAGNOSIS — L820 Inflamed seborrheic keratosis: Secondary | ICD-10-CM | POA: Diagnosis not present

## 2021-12-10 ENCOUNTER — Telehealth: Payer: Self-pay

## 2021-12-10 NOTE — Progress Notes (Signed)
Called pt today and pt confirmed appt tomorrow with CPP  [name], [place] Pharmacist Assistant  [phone]

## 2021-12-11 ENCOUNTER — Ambulatory Visit (INDEPENDENT_AMBULATORY_CARE_PROVIDER_SITE_OTHER): Payer: Medicare Other

## 2021-12-11 ENCOUNTER — Other Ambulatory Visit: Payer: Self-pay

## 2021-12-11 ENCOUNTER — Other Ambulatory Visit: Payer: Self-pay | Admitting: Physician Assistant

## 2021-12-11 VITALS — BP 122/54

## 2021-12-11 DIAGNOSIS — E1169 Type 2 diabetes mellitus with other specified complication: Secondary | ICD-10-CM

## 2021-12-11 DIAGNOSIS — I482 Chronic atrial fibrillation, unspecified: Secondary | ICD-10-CM

## 2021-12-11 DIAGNOSIS — D6869 Other thrombophilia: Secondary | ICD-10-CM

## 2021-12-11 DIAGNOSIS — J449 Chronic obstructive pulmonary disease, unspecified: Secondary | ICD-10-CM

## 2021-12-11 DIAGNOSIS — I10 Essential (primary) hypertension: Secondary | ICD-10-CM

## 2021-12-11 NOTE — Patient Instructions (Signed)
Visit Information   Goals Addressed   None    Patient Care Plan: CCM Pharmacy Care Plan     Problem Identified: afib, dm, copd, hld   Priority: High  Onset Date: 02/14/2021     Long-Range Goal: Disease Management   Start Date: 02/14/2021  Expected End Date: 02/14/2022  Recent Progress: On track  Priority: High  Note:   Current Barriers:  Unable to independently afford treatment regimen  Pharmacist Clinical Goal(s):  Over the next 90 days, patient will verbalize ability to afford treatment regimen through collaboration with PharmD and provider.   Interventions: 1:1 collaboration with [facility], Elnita Maxwell, MD regarding development and update of comprehensive plan of care as evidenced by provider attestation and co-signature Inter-disciplinary care team collaboration (see longitudinal plan of care) Comprehensive medication review performed; medication list updated in electronic medical record  Depression/Anxiety -Controlled -Current treatment: Sertraline 50mg  QD -Medications previously tried/failed: N/A -PHQ9:  Depression screen [facility] 2/9 12/11/2021 07/05/2021 03/05/2021  Decreased Interest 0 0 0  Down, Depressed, Hopeless 0 0 0  PHQ - 2 Score 0 0 0  Altered sleeping - - 0  Tired, decreased energy - - 0  Change in appetite - - 0  Feeling bad or failure about yourself  - - 0  Trouble concentrating - - 0  Moving slowly or fidgety/restless - - 0  Suicidal thoughts - - 0  PHQ-9 Score - - 0  Difficult doing work/chores - - Not difficult at all  Some recent data might be hidden  -GAD7: No flowsheet data found. -Educated on Benefits of medication for symptom control -Recommended to continue current medication   Hyperlipidemia: (LDL goal < 100) The 10-year ASCVD risk score (Arnett DK, et al., 2019) is: 39%   Values used to calculate the score:     Age: 75 years     Sex: Male     Is Non-Hispanic African American: No     Diabetic: Yes     Tobacco smoker: No     Systolic Blood  Pressure: 110 mmHg     Is BP treated: Yes     HDL Cholesterol: 44 mg/dL     Total Cholesterol: 144 mg/dL Lab Results  Component Value Date   CHOL 144 10/05/2021   CHOL 159 07/03/2021   CHOL 182 03/01/2021   Lab Results  Component Value Date   HDL 44 10/05/2021   HDL 53 07/03/2021   HDL 78 03/01/2021   Lab Results  Component Value Date   LDLCALC 70 10/05/2021   LDLCALC 83 07/03/2021   LDLCALC 80 03/01/2021   Lab Results  Component Value Date   TRIG 180 (H) 10/05/2021   TRIG 131 07/03/2021   TRIG 145 03/01/2021   Lab Results  Component Value Date   CHOLHDL 3.3 10/05/2021   CHOLHDL 3.0 07/03/2021   CHOLHDL 2.3 03/01/2021  No results found for: LDLDIRECT -Controlled -Current treatment: Atorvastatin 40 mg daily after supper -Medications previously tried: none reported  -Current dietary patterns: eats healthy -Current exercise habits: starting pulmonary rehab -Educated on Cholesterol goals;  Benefits of statin for ASCVD risk reduction; Importance of limiting foods high in cholesterol; -Counseled on diet and exercise extensively Recommended to continue current medication  Atrial Fibrillation (Goal: prevent stroke and major bleeding) -Controlled -CHADSVASC: 5 -Current treatment: Rate control:  Diltiazem CD 120 mg daily Bisoprolol 5 mg daily Anticoagulation:  Clopidogrel 75mg  (Will stop Feb 2023 then transition to ASA per Cardio note) -Medications previously tried: Eliquis 5 mg bid (  Watchman device 07/2021 - patient should be transitioning off Eliquis ~09/2021)  -Home BP and HR readings:  December 2022: 122/54 (Checks daily) -Counseled on importance of adherence to anticoagulant exactly as prescribed; avoidance of NSAIDs due to increased bleeding risk with anticoagulants; coordinating application of Eliquis once 3% out of pocket proof received.  -Recommended to continue current medication.    COPD (Goal: control symptoms and prevent  exacerbations) -Controlled -Current treatment  Albuterol inhaler 2 puffs every 6 hours prn for wheezing/shortness of breath ($45) albuterol every 6 hours prn  Breztri 2 puffs bid  -Medications previously tried: Trelegy  -Gold Grade: Gold 3 (FEV1 30-49%) -Exacerbations requiring treatment in last 6 months: yes -Patient reports consistent use of maintenance inhaler -Frequency of rescue inhaler use: using levalbuterol nebulizer prn -Counseled on Proper inhaler technique; Benefits of consistent maintenance inhaler use December 2022: Patient brought in PAP for Breztri for 2023 on 12/11/21, will make sure is faxed out today. Albuterol is expensive per patient, told him to call me next time he needs refills so I can print out a GoodRx coupon and hopefully get for $20   Patient Goals/Self-Care Activities Over the next 90 days, patient will:  - take medications as prescribed  Follow Up Plan: Telephone follow up appointment with care management team member scheduled WYS:HUOH 2023  [place] Constable, Pharm.D. - [phone]       The patient verbalized understanding of instructions, educational materials, and care plan provided today and declined offer to receive copy of patient instructions, educational materials, and care plan.  The pharmacy team will reach out to the patient again over the next 30 days.   Lane Hacker, [facility]

## 2021-12-11 NOTE — Progress Notes (Signed)
Chronic Care Management Pharmacy Note  12/11/2021 Name:  Paul [name] MRN:  [id number] DOB:  May 02, 1946   Plan Updates:  Watchman device - procedure went well. Patient reports that he should be off of Eliquis in 6 weeks.  Pharmacy team to coordinate patient assistance renewal.  Patient currently doing very well from respiratory standpoint and down to 5 mg daily of prednisone. Will be back to the gym next week once cleared after Watchman device. He hopes to play golf again once the temperature is cooler. Patient reports his wife will be having hip replacement in the next 2 months.   Subjective: Paul [name] is an 75 y.o. year old male who is a primary patient of Cox, Kirsten, MD.  The CCM team was consulted for assistance with disease management and care coordination needs.    Engaged with patient by telephone for follow up visit in response to provider referral for pharmacy case management and/or care coordination services.   Consent to Services:  The patient was given information about Chronic Care Management services, agreed to services, and gave verbal consent prior to initiation of services.  Please see initial visit note for detailed documentation.   Patient Care Team: [name], MD as PCP - General (Family Medicine) Jerline Pain, MD as PCP - Cardiology (Cardiology) Vickie Epley, MD as PCP - Electrophysiology (Cardiology) Elsie Stain, MD as Attending Physician (Pulmonary Disease) Tanda Rockers, MD as Consulting Physician (Pulmonary Disease) [place] as Registered Nurse [facility] and Palliative Medicine) Burnice Logan, [facility] (Inactive) as Pharmacist (Pharmacist) Lane Hacker, [facility] (Pharmacist)  Recent office visits:  10/09/21 [name] MD. Seen for DM, HTN, HLD. D/C Alprazolam 0.25 mg 2 times daily.    09/06/21 Meredith Mody RN. Seen for Osteoporosis. Injected Denosumab 60 mg.    Recent consult visits:  09/13/21 Cardiology)  Telephone Encounter. Instructed patient to STOP ELIQUIS and ASPIRIN after 09/16/2021 doses. He will also stop omeprazole.Instruct him to START PLAVIX 75 mg daily and pantoprazole 40 mg once daily on 09/17/2021.   09/11/21 (Pulmonology) Telephone Encounter. Started on Prednisone 5 mg daily.    09/06/21 (Cardiology) Angelena Form PA-C. Seen for A-Fib. Changed Ascorbic Acid from 500 mg to 1000 mg daily.   Hospital visits:  Medication Reconciliation was completed by comparing discharge summary, patients EMR and Pharmacy list, and upon discussion with patient.   Admitted to the hospital on 09/13/21 due to A-Fib. Discharge date was 09/13/21. Discharged from [facility].     No medication changes    Objective:  Lab Results  Component Value Date   CREATININE 0.97 10/05/2021   BUN 15 10/05/2021   GFRNONAA >60 08/03/2021   GFRAA 107 11/20/2020   NA 142 10/05/2021   K 4.4 10/05/2021   CALCIUM 9.6 10/05/2021   CO2 27 10/05/2021   GLUCOSE 114 (H) 10/05/2021    Lab Results  Component Value Date/Time   HGBA1C 6.1 (H) 10/05/2021 08:16 AM   HGBA1C 6.3 (H) 07/03/2021 08:12 AM   MICROALBUR Negative 10/09/2021 02:30 PM    Last diabetic Eye exam: No results found for: HMDIABEYEEXA  Last diabetic Foot exam: No results found for: HMDIABFOOTEX   Lab Results  Component Value Date   CHOL 144 10/05/2021   HDL 44 10/05/2021   LDLCALC 70 10/05/2021   TRIG 180 (H) 10/05/2021   CHOLHDL 3.3 10/05/2021    Hepatic Function Latest Ref Rng & Units 10/05/2021 07/03/2021 05/10/2021  Total  Protein 6.0 - 8.5 g/dL 6.2 6.5 6.3  Albumin 3.7 - 4.7 g/dL 4.4 4.2 4.3  AST 0 - 40 IU/L 26 24 21   ALT 0 - 44 IU/L 29 25 20   Alk Phosphatase 44 - 121 IU/L 110 73 82  Total Bilirubin 0.0 - 1.2 mg/dL <0.2 0.2 <0.2  Bilirubin, Direct 0.0 - 0.2 mg/dL - - -    Lab Results  Component Value Date/Time   TSH 2.539 03/22/2019 06:21 AM    CBC Latest Ref Rng & Units 10/05/2021 09/06/2021 07/03/2021  WBC 3.4  - 10.8 x10E3/uL 11.0(H) 10.3 9.9  Hemoglobin 13.0 - 17.7 g/dL 12.8(L) 11.7(L) 12.4(L)  Hematocrit 37.5 - 51.0 % 41.3 36.7(L) 40.3  Platelets 150 - 450 x10E3/uL 311 263 307    No results found for: VD25OH  Clinical ASCVD: No  The 10-year ASCVD risk score (Arnett DK, et al., 2019) is: 44.9%   Values used to calculate the score:     Age: 75 years     Sex: Male     Is Non-Hispanic African American: No     Diabetic: Yes     Tobacco smoker: No     Systolic Blood Pressure: 419 mmHg     Is BP treated: Yes     HDL Cholesterol: 44 mg/dL     Total Cholesterol: 144 mg/dL    Depression screen [facility] 2/9 12/11/2021 07/05/2021 03/05/2021  Decreased Interest 0 0 0  Down, Depressed, Hopeless 0 0 0  PHQ - 2 Score 0 0 0  Altered sleeping - - 0  Tired, decreased energy - - 0  Change in appetite - - 0  Feeling bad or failure about yourself  - - 0  Trouble concentrating - - 0  Moving slowly or fidgety/restless - - 0  Suicidal thoughts - - 0  PHQ-9 Score - - 0  Difficult doing work/chores - - Not difficult at all  Some recent data might be hidden     Social History   Tobacco Use  Smoking Status Former   Packs/day: 2.00   Years: 52.00   Pack years: 104.00   Types: Cigarettes   Quit date: 02/03/2009   Years since quitting: 12.8  Smokeless Tobacco Never  Tobacco Comments   Counseled to remain smoke free   BP Readings from Last 3 Encounters:  12/11/21 (!) 122/54  12/04/21 (!) 110/50  11/28/21 (!) 144/60   Pulse Readings from Last 3 Encounters:  12/04/21 76  11/28/21 76  11/27/21 81   Wt Readings from Last 3 Encounters:  12/04/21 149 lb (67.6 kg)  11/27/21 148 lb 12.8 oz (67.5 kg)  11/22/21 145 lb (65.8 kg)   BMI Readings from Last 3 Encounters:  12/04/21 24.05 kg/m  11/27/21 24.02 kg/m  11/22/21 23.40 kg/m    Assessment/Interventions: Review of patient past medical history, allergies, medications, health status, including review of consultants reports, laboratory and other  test data, was performed as part of comprehensive evaluation and provision of chronic care management services.   SDOH:  (Social Determinants of Health) assessments and interventions performed: Yes SDOH Interventions    Flowsheet Row Most Recent Value  SDOH Interventions   Financial Strain Interventions Other (Comment)  [Breztri PAP]      SDOH Screenings   Alcohol Screen: Low Risk    Last Alcohol Screening Score (AUDIT): 0  Depression (PHQ2-9): Low Risk    PHQ-2 Score: 0  Financial Resource Strain: High Risk   Difficulty of Paying Living Expenses: Hard  Food Insecurity: No Food Insecurity   Worried About Charity fundraiser in the Last Year: Never true   Arboriculturist in the Last Year: Never true  Housing: Low Risk    Last Housing Risk Score: 0  Physical Activity: Not on file  Social Connections: Not on file  Stress: Not on file  Tobacco Use: Medium Risk   Smoking Tobacco Use: Former   Smokeless Tobacco Use: Never   Passive Exposure: Not on file  Transportation Needs: No Transportation Needs   Lack of Transportation (Medical): No   Lack of Transportation (Non-Medical): No    CCM Care Plan  Allergies  Allergen Reactions   Daliresp [Roflumilast] Diarrhea and Nausea And Vomiting   Levaquin [Levofloxacin] Palpitations    Made the B/P fluctuate and heart raced   Alendronate Anxiety and Other (See Comments)    Jittery/nervous    Medications Reviewed Today     Reviewed by Maren Beach, Adair Laundry, CMA (Certified Medical Assistant) on 12/04/21 at [id number]  Med List Status: <None>   Medication Order Taking? Sig Documenting Provider Last Dose Status Informant  albuterol (PROAIR HFA) 108 (90 Base) MCG/ACT inhaler [id number] Yes Inhale 2 puffs into the lungs every 6 (six) hours as needed for wheezing or shortness of breath.  [provider] Taking Active Spouse/Significant Other  albuterol (PROVENTIL) (2.5 MG/3ML) 0.083% nebulizer solution [id number] Yes Take 3 mLs (2.5  mg total) by nebulization in the morning and at bedtime. Parrett, Fonnie Mu, NP Taking Active Spouse/Significant Other  Ascorbic Acid (VITAMIN C) 1000 MG tablet [id number] Yes Take 1,000 mg by mouth daily. [provider] Taking Active Spouse/Significant Other  atorvastatin (LIPITOR) 40 MG tablet [id number] Yes TAKE 1 TABLET BY MOUTH ONCE DAILY AFTER SUPPER Cox, Kirsten, MD Taking Active   bisoprolol (ZEBETA) 5 MG tablet [id number] Yes Take 1 tablet (5 mg total) by mouth daily. Jerline Pain, MD Taking Active Spouse/Significant Other  Budeson-Glycopyrrol-Formoterol (BREZTRI AEROSPHERE) 160-9-4.8 MCG/ACT Hollie Salk [id number] Yes Inhale 2 puffs into the lungs 2 (two) times daily. Cox, Kirsten, MD Taking Active Spouse/Significant Other  Budeson-Glycopyrrol-Formoterol (BREZTRI AEROSPHERE) 160-9-4.8 MCG/ACT Hollie Salk [id number] Yes Inhale 2 puffs into the lungs in the morning and at bedtime. Parrett, Fonnie Mu, NP Taking Active   calcium carbonate (OS-CAL) 600 MG TABS tablet [id number] Yes Take 600 mg by mouth daily. [provider] Taking Active Spouse/Significant Other  Cholecalciferol (VITAMIN D) 50 MCG (2000 UT) CAPS [id number] Yes Take 2,000 Units by mouth daily. [provider] Taking Active Spouse/Significant Other  clopidogrel (PLAVIX) 75 MG tablet [id number] Yes Take 1 tablet (75 mg total) by mouth daily. Eileen Stanford, PA-C Taking Active   denosumab (PROLIA) 60 MG/ML SOSY injection [id number] Yes Inject 60 mg into the skin every 6 (six) months. [provider] Taking Active Spouse/Significant Other           Med Note Randye Lobo, Ludger Nutting   Fri Jan 19, 2021  8:28 AM)    diltiazem (CARDIZEM) 60 MG tablet [id number] Yes Take 1 tablet (60 mg total) by mouth 4 (four) times daily as needed. Jerline Pain, MD Taking Active   ferrous sulfate 325 (65 FE) MG tablet [id number] Yes Take 325 mg by mouth 2 (two) times daily with a meal. [provider] Taking Active Spouse/Significant  Other  furosemide (LASIX) 40 MG tablet [id number] Yes Take 1 tablet (40 mg total) by mouth daily. Jerline Pain, MD Taking Active Spouse/Significant Other  guaiFENesin (MUCINEX) 600 MG 12  hr tablet [id number] Yes Take 600 mg by mouth daily. [provider] Taking Active Spouse/Significant Other           Med Note (Alfonse Spruce, ANH T   Sat Apr 01, 2020  4:27 PM)    loratadine (CLARITIN) 10 MG tablet [id number] Yes Take 10 mg by mouth in the morning. [provider] Taking Active Spouse/Significant Other  Multiple Vitamin (MULTIVITAMIN WITH MINERALS) TABS tablet [id number] Yes Take 1 tablet by mouth daily. [provider] Taking Active Spouse/Significant Other  Niacinamide-Zn-Cu-Methfo-Se-Cr (NICOTINAMIDE PO) [id number] Yes Take 1 tablet by mouth in the morning and at bedtime. [provider] Taking Active Spouse/Significant Other           Med Note York Grice Sep 06, 2021  1:22 PM)    OXYGEN [id number] Yes Inhale 2-2.5 L/min into the lungs See admin instructions. Inhale 2 L/min of oxygen into the lungs at bedtime and when ambulatory [provider] Taking Active Spouse/Significant Other  pantoprazole (PROTONIX) 40 MG tablet [id number] Yes Take 1 tablet (40 mg total) by mouth daily. Eileen Stanford, PA-C Taking Active   predniSONE (DELTASONE) 5 MG tablet [id number] Yes Take 1 tablet (5 mg total) by mouth daily with breakfast. Parrett, Fonnie Mu, NP Taking Active   sertraline (ZOLOFT) 50 MG tablet [id number] Yes Take 1 tablet by mouth once daily Cox, Kirsten, MD Taking Active Spouse/Significant Other  tiZANidine (ZANAFLEX) 2 MG tablet [id number] Yes Take 1 tablet (2 mg total) by mouth every 6 (six) hours as needed for muscle spasms. 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/22/2021   Oxygen dependent 10/09/2021   Paroxysmal atrial  fibrillation ([place]) 08/02/2021   Presence of Watchman left atrial appendage closure device 08/02/2021   Chronic respiratory failure with hypoxia ([place]) 01/16/2021   GAD (generalized anxiety disorder) 01/14/2021   Chronic diastolic CHF (congestive heart failure) ([place]) 04/01/2020   Age-related osteoporosis with current pathological fracture 02/06/2020   Essential hypertension 01/28/2020   Chronic bilateral thoracic back pain 01/25/2020   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[place]) 01/25/2020   HLD (hyperlipidemia) 01/05/2020   Chronic anemia 05/29/2019   Pulmonary nodules 05/29/2019   Coronary artery calcification 04/06/2019   Atherosclerosis of aorta ([place]) 04/06/2019   Former cigarette smoker 12/05/2015   COPD GOLD  III      Immunization History  Administered Date(s) Administered   Fluad Quad(high Dose 65+) 10/02/2020, 10/09/2021   Influenza Split 09/22/2013, 09/01/2015   Influenza Whole 09/04/2011, 09/21/2012   Influenza, High Dose Seasonal PF 09/02/2016, 09/10/2017, 08/31/2018, 09/24/2018, 10/21/2019   Influenza-Unspecified 08/23/2014   Moderna Covid-19 Vaccine Bivalent Booster 69yr & up 10/09/2021   Moderna SARS-COV2 Booster Vaccination 07/05/2021   Moderna Sars-Covid-2 Vaccination 01/17/2020, 02/14/2020, 10/06/2020   Pneumococcal Conjugate-13 01/11/2014, 12/09/2016   Pneumococcal Polysaccharide-23 08/23/2010, 08/29/2011   Zoster Recombinat (Shingrix) 04/04/2014    Conditions to be addressed/monitored:  Hypertension, Hyperlipidemia, Diabetes, Atrial Fibrillation and COPD  Care Plan : [place] Updates made by KLane Hacker [place] 12/11/2021 12:00 AM     Problem: afib, dm, copd, hld   Priority: High  Onset Date: 02/14/2021     Long-Range Goal: Disease Management   Start Date: 02/14/2021  Expected End Date: 02/14/2022  Recent Progress: On track  Priority: High  Note:   Current Barriers:  Unable to independently afford treatment  regimen  Pharmacist Clinical Goal(s):  Over the next 90 days, patient will verbalize ability to afford treatment regimen through collaboration with PharmD and provider.   Interventions: 1:1 collaboration with Cox, Elnita Maxwell, MD regarding development and update of comprehensive plan of care as evidenced by provider attestation and co-signature Inter-disciplinary care team collaboration (see longitudinal plan of care) Comprehensive medication review performed; medication list updated in electronic medical record  Depression/Anxiety -Controlled -Current treatment: Sertraline 94m QD -Medications previously tried/failed: N/A -PHQ9:  [facility] 12/11/2021 07/05/2021 03/05/2021  Decreased Interest 0 0 0  Down, Depressed, Hopeless 0 0 0  PHQ - 2 Score 0 0 0  Altered sleeping - - 0  Tired, decreased energy - - 0  Change in appetite - - 0  Feeling bad or failure about yourself  - - 0  Trouble concentrating - - 0  Moving slowly or fidgety/restless - - 0  Suicidal thoughts - - 0  PHQ-9 Score - - 0  Difficult doing work/chores - - Not difficult at all  Some recent data might be hidden  -GAD7: No flowsheet data found. -Educated on Benefits of medication for symptom control -Recommended to continue current medication   Hyperlipidemia: (LDL goal < 100) The 10-year ASCVD risk score (Arnett DK, et al., 2019) is: 39%   Values used to calculate the score:     Age: [age]years     Sex: Male     Is Non-Hispanic African American: No     Diabetic: Yes     Tobacco smoker: No     Systolic Blood Pressure: 1182mmHg     Is BP treated: Yes     HDL Cholesterol: 44 mg/dL     Total Cholesterol: 144 mg/dL Lab Results  Component Value Date   CHOL 144 10/05/2021   CHOL 159 07/03/2021   CHOL 182 03/01/2021   Lab Results  Component Value Date   HDL 44 10/05/2021   HDL 53 07/03/2021   HDL 78 03/01/2021   Lab Results  Component Value Date   LDLCALC 70 10/05/2021   LDLCALC 83 07/03/2021    LDLCALC 80 03/01/2021   Lab Results  Component Value Date   TRIG 180 (H) 10/05/2021   TRIG 131 07/03/2021   TRIG 145 03/01/2021   Lab Results  Component Value Date   CHOLHDL 3.3 10/05/2021   CHOLHDL 3.0 07/03/2021   CHOLHDL 2.3 03/01/2021  No results found for: LDLDIRECT -Controlled -Current treatment: Atorvastatin 40 mg daily after supper -Medications previously tried: none reported  -Current dietary patterns: eats healthy -Current exercise habits: starting pulmonary rehab -Educated on Cholesterol goals;  Benefits of statin for ASCVD risk reduction; Importance of limiting foods high in cholesterol; -Counseled on diet and exercise extensively Recommended to continue current medication  Atrial Fibrillation (Goal: prevent stroke and major bleeding) -Controlled -CHADSVASC: 5 -Current treatment: Rate control:  Diltiazem CD 120 mg daily Bisoprolol 5 mg daily Anticoagulation:  Clopidogrel 751m(Will stop Feb 2023 then transition to ASA per Cardio note) -Medications previously tried: Eliquis 5 mg bid (Watchman device 07/2021 - patient should be transitioning off Eliquis ~09/2021)  -Home BP and HR readings:  December 2022: 122/54 (Checks daily) -Counseled on importance of adherence to anticoagulant exactly as prescribed; avoidance of NSAIDs due to increased bleeding risk with anticoagulants; coordinating application of Eliquis once 3% out of pocket proof received.  -Recommended to continue current medication.  COPD (Goal: control symptoms and prevent exacerbations) -Controlled -Current treatment  Albuterol inhaler 2 puffs every 6 hours prn for wheezing/shortness of breath ($45) albuterol every 6 hours prn  Breztri 2 puffs bid  -Medications previously tried: Trelegy  -Gold Grade: Gold 3 (FEV1 30-49%) -Exacerbations requiring treatment in last 6 months: yes -Patient reports consistent use of maintenance inhaler -Frequency of rescue inhaler use: using levalbuterol  nebulizer prn -Counseled on Proper inhaler technique; Benefits of consistent maintenance inhaler use December 2022: Patient brought in PAP for Breztri for 2023 on 12/11/21, will make sure is faxed out today. Albuterol is expensive per patient, told him to call me next time he needs refills so I can print out a GoodRx coupon and hopefully get for $20   Patient Goals/Self-Care Activities Over the next 90 days, patient will:  - take medications as prescribed  Follow Up Plan: Telephone follow up appointment with care management team member scheduled PBA:QVOH 2023  [place] Constable, Pharm.D. - [phone]        Medication Assistance:  12/11/21: Patient brought in [place], will fax out today for 2023 coverage  Patient's preferred pharmacy is:  [facility], [address] [place] [address] Phone: [phone] Fax: [phone]  [place], [address] [place] [address] Phone: [phone] Fax: [phone]   Uses pill box? Yes Pt endorses 100% compliance  We discussed: Current pharmacy is preferred with insurance plan and patient is satisfied with pharmacy services Patient decided to: Continue current medication management strategy  Care Plan and Follow Up Patient Decision:  Patient agrees to Care Plan and Follow-up.  Plan: Telephone follow up appointment with care management team member scheduled for:  July 2023  Arizona Constable, [place].D. - [phone]

## 2021-12-12 ENCOUNTER — Ambulatory Visit
Admission: RE | Admit: 2021-12-12 | Discharge: 2021-12-12 | Disposition: A | Discharge status: Home / Self Care | Payer: Medicare Other | Source: Ambulatory Visit | Attending: Interventional Radiology | Admitting: Interventional Radiology

## 2021-12-12 ENCOUNTER — Other Ambulatory Visit: Payer: Self-pay

## 2021-12-12 DIAGNOSIS — S32010S Wedge compression fracture of first lumbar vertebra, sequela: Secondary | ICD-10-CM

## 2021-12-12 DIAGNOSIS — Z9889 Other specified postprocedural states: Secondary | ICD-10-CM | POA: Diagnosis not present

## 2021-12-12 DIAGNOSIS — S32010D Wedge compression fracture of first lumbar vertebra, subsequent encounter for fracture with routine healing: Secondary | ICD-10-CM | POA: Diagnosis not present

## 2021-12-12 NOTE — Progress Notes (Signed)
Chief Complaint: Patient was consulted remotely today (TeleHealth) for L1 compression fracture at the request of Ebba Goll K.    Referring Physician(s): Cox, Kirsten  History of Present Illness: Paul [name] is a 75 y.o. male who presented for evaluation L1 compression fracture with associated pain and decreased mobility on 11/22/21.  His symptoms began approximately 2-1/2 weeks prior on Monday the 14th of November.  His wife fluid recently had her hip replaced slipped and fell to the floor.  While he was trying to help lift her off the floor he felt and heard a loud pop in the middle of his back.  By the next day, he had severe back pain which only became worse over the coming days.  He underwent L1 cement augmentation with kyphoplasty on 11/28/21. He is pleased to report that his pain has significantly improved.  His pain is decreased 2 or 3 on a 10 point scale.  This is compared to 8/10 previously.  He has no significant complaints and feels significantly better.    He denies lower extremity weakness, paresthesias or changes in bowel or bladder continence.   Past Medical History:  Diagnosis Date   Anemia    Aortic atherosclerosis (HCC)    Atrial fibrillation (HCC)    Back pain    COPD (chronic obstructive pulmonary disease) ([place])    COVID-19 12/24/2020   Diabetes mellitus without complication (HCC)    Elevated PSA    GAD (generalized anxiety disorder)    GERD (gastroesophageal reflux disease)    High cholesterol    Hypertension    Lingular pneumonia 01/05/2021   See cxr 01/06/20 rx with zpak/ cefipime x 5 days and then developed purulent sputum again 01/12/21 so rx levcaquin 500 mg daily x 7 days then return for cxr before more    Lung nodule < 6cm on CT 05/29/2016   Osteoporosis    Oxygen deficiency    Pneumonia    January 2022   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 Quentin Ore with a 20 mm Watchman FLX device   Vertebral  compression fracture ([place]) 05/29/2019   T8 compression fracture noted on CT scan 05/28/2019 inpatient with chronic steroid dependent COPD - rx calcitonin nasal spray rx per PCP     Past Surgical History:  Procedure Laterality Date   CATARACT EXTRACTION Right    IR KYPHO EA ADDL LEVEL THORACIC OR LUMBAR  11/28/2021   LEFT ATRIAL APPENDAGE OCCLUSION N/A 08/02/2021   Procedure: LEFT ATRIAL APPENDAGE OCCLUSION;  Surgeon: Sherren Mocha, MD;  Location: [facility];  Service: Cardiovascular;  Laterality: N/A;   SKIN SURGERY     shoulders and chest   TEE WITHOUT CARDIOVERSION N/A 08/02/2021   Procedure: TRANSESOPHAGEAL ECHOCARDIOGRAM (TEE);  Surgeon: Sherren Mocha, MD;  Location: [facility];  Service: Cardiovascular;  Laterality: N/A;   TEE WITHOUT CARDIOVERSION N/A 09/13/2021   Procedure: TRANSESOPHAGEAL ECHOCARDIOGRAM (TEE);  Surgeon: Sanda Klein, MD;  Location: [facility];  Service: Cardiovascular;  Laterality: N/A;    Allergies: Daliresp [roflumilast], Levaquin [levofloxacin], and Alendronate  Medications: Prior to Admission medications   Medication Sig Start Date End Date Taking? Authorizing Provider  albuterol (PROAIR HFA) 108 (90 Base) MCG/ACT inhaler Inhale 2 puffs into the lungs every 6 (six) hours as needed for wheezing or shortness of breath.     [provider]  albuterol (PROVENTIL) (2.5 MG/3ML) 0.083% nebulizer solution Take 3 mLs (2.5 mg total)  by nebulization in the morning and at bedtime. 05/09/20   Parrett, Fonnie Mu, NP  Ascorbic Acid (VITAMIN C) 1000 MG tablet Take 1,000 mg by mouth daily.    [provider]  atorvastatin (LIPITOR) 40 MG tablet TAKE 1 TABLET BY MOUTH ONCE DAILY AFTER SUPPER 11/29/21   Cox, Kirsten, MD  bisoprolol (ZEBETA) 5 MG tablet Take 1 tablet (5 mg total) by mouth daily. 01/19/21   Jerline Pain, MD  Budeson-Glycopyrrol-Formoterol (BREZTRI AEROSPHERE) 160-9-4.8 MCG/ACT AERO Inhale 2 puffs into the lungs 2 (two) times daily.  03/26/21   Cox, Elnita Maxwell, MD  Budeson-Glycopyrrol-Formoterol (BREZTRI AEROSPHERE) 160-9-4.8 MCG/ACT AERO Inhale 2 puffs into the lungs in the morning and at bedtime. Patient not taking: Reported on 12/11/2021 11/27/21   Parrett, Fonnie Mu, NP  calcium carbonate (OS-CAL) 600 MG TABS tablet Take 600 mg by mouth daily.    [provider]  Cholecalciferol (VITAMIN D) 50 MCG (2000 UT) CAPS Take 2,000 Units by mouth daily.    [provider]  clopidogrel (PLAVIX) 75 MG tablet Take 1 tablet (75 mg total) by mouth daily. 09/13/21 03/12/22  Eileen Stanford, PA-C  denosumab (PROLIA) 60 MG/ML SOSY injection Inject 60 mg into the skin every 6 (six) months.    [provider]  diltiazem (CARDIZEM) 60 MG tablet Take 1 tablet (60 mg total) by mouth 4 (four) times daily as needed. Patient not taking: Reported on 12/11/2021 01/19/21   Jerline Pain, MD  ferrous sulfate 325 (65 FE) MG tablet Take 325 mg by mouth 2 (two) times daily with a meal.    [provider]  furosemide (LASIX) 40 MG tablet Take 1 tablet (40 mg total) by mouth daily. 01/19/21   Jerline Pain, MD  guaiFENesin (MUCINEX) 600 MG 12 hr tablet Take 600 mg by mouth daily.    [provider]  loratadine (CLARITIN) 10 MG tablet Take 10 mg by mouth in the morning.    [provider]  Multiple Vitamin (MULTIVITAMIN WITH MINERALS) TABS tablet Take 1 tablet by mouth daily.    [provider]  Niacinamide-Zn-Cu-Methfo-Se-Cr (NICOTINAMIDE PO) Take 1 tablet by mouth in the morning and at bedtime.    [provider]  OXYGEN Inhale 2-2.5 L/min into the lungs See admin instructions. Inhale 2 L/min of oxygen into the lungs at bedtime and when ambulatory    [provider]  pantoprazole (PROTONIX) 40 MG tablet Take 1 tablet by mouth once daily 12/12/21   Jerline Pain, MD  predniSONE (DELTASONE) 5 MG tablet Take 1 tablet (5 mg total) by mouth daily with breakfast. 11/27/21   Parrett, Fonnie Mu, NP  sertraline (ZOLOFT) 50 MG tablet Take 1 tablet by mouth once daily 06/28/21   Cox, Kirsten, MD  tiZANidine (ZANAFLEX) 2 MG tablet Take 1 tablet (2 mg total) by mouth every 6 (six) hours as needed for muscle spasms. 11/12/21   CoxElnita Maxwell, MD     Family History  Problem Relation Age of Onset   Heart disease Brother    Heart disease Mother    Heart disease Father    Cancer Paternal Uncle     Social History   Socioeconomic History   Marital status: Married    Spouse name: Not on file   Number of children: 3   Years of education: Not on file   Highest education level: Not on file  Occupational History   Occupation: retired    Comment: truck driver  Tobacco  Use   Smoking status: Former    Packs/day: 2.00    Years: 52.00    Pack years: 104.00    Types: Cigarettes    Quit date: 02/03/2009    Years since quitting: 12.8   Smokeless tobacco: Never   Tobacco comments:    Counseled to remain smoke free  Vaping Use   Vaping Use: Never used  Substance and Sexual Activity   Alcohol use: Not Currently    Alcohol/week: 0.0 standard drinks    Comment: occ   Drug use: No   Sexual activity: Not on file  Other Topics Concern   Not on file  Social History Narrative   Originally from [place]. Previously worked driving a Restaurant manager, fast food. No pets currently. No mold exposure. 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       wears sunscreen, brushes and flosses daily, see's dentist bi-annually, has smoke/carbon monoxide detectors, wears a seatbelt and practices gun safety      Social Determinants of Health   Financial Resource Strain: High Risk   Difficulty of Paying Living Expenses: Hard  Food Insecurity: No Food Insecurity   Worried About Charity fundraiser in the Last Year: Never true   Ran Out of Food in the Last Year: Never true  Transportation Needs: No Transportation Needs   Lack of Transportation (Medical): No    Lack of Transportation (Non-Medical): No  Physical Activity: Not on file  Stress: Not on file  Social Connections: Not on file   Review of Systems  Review of Systems: A 12 point ROS discussed and pertinent positives are indicated in the HPI above.  All other systems are negative.  Physical Exam No direct physical exam was performed (except for noted visual exam findings with Video Visits).    Vital Signs: There were no vitals taken for this visit.  Imaging: DG Radiologist Eval And Mgmt  Result Date: 11/22/2021 EXAM: NEW PATIENT OFFICE VISIT CHIEF COMPLAINT: Compression fracture of the L1 vertebral body, initial encounter Current Pain Level: 8 HISTORY OF PRESENT ILLNESS: Paul [name] is a pleasant 75 year old gentleman who presents for evaluation L1 compression fracture with associated pain and decreased mobility. His symptoms began approximately 2-1/2 weeks ago on Monday the 14th of November. His wife fluid recently had her hip replaced slipped and fell to the floor. While he was trying to help lift her off the floor he felt and heard a loud pop in the middle of his back. By the next day, he had severe back pain which only became worse over the coming days. He rates his pain at an 8/10. The pain is disrupting his lifestyle as he can no longer plate golf or in joint other activities. His Roland Morris disability questionnaire scores a 5/24. He had a prior compression fracture in the past which was also quite painful. He struggled for months with this pain and discomfort before the fracture finally healed. He feels very strongly that he does not want to go through a prolonged course of pain and slow healing again. His wife remains frail and recovering from her hip replacement and requires his assistance. It is very hard for him to care for his wife with his continuous back pain. He is currently taking pain medication and muscle relaxers which takes the edge off but is insufficient to control his  pain. He denies lower extremity weakness, paresthesias or changes in bowel or bladder continence.  Past Medical History: See epic Medications: See epic Allergies: See epic Social History: See epic Family History:  See epic REVIEW OF SYSTEMS: Ten point review of systems was otherwise negative. PHYSICAL EXAMINATION: See epic ASSESSMENT AND PLAN: Very pleasant 75 year old gentleman with a subacute L1 compression fracture with significant and persistent symptoms. He is an excellent candidate for percutaneous cement augmentation with kyphoplasty. We discussed the natural history of vertebral fractures and the fact that they will eventually heal on their own with conservative management but the healing process may be prolonged. He understands and has been down that road before with a prior fracture and feel strongly that he needs pain relief more expeditiously so that he can care for his wife. Please schedule for L1 cement augmentation with kyphoplasty as soon as possible. He will need to hold his Plavix prior to the procedure. Electronically Signed   By: Jacqulynn Cadet M.D.   On: 11/22/2021 15:35   IR KYPHO EA ADDL LEVEL THORACIC OR LUMBAR  Result Date: 11/28/2021 CLINICAL DATA:  75 year old male with highly symptomatic L1 compression fracture. He presents for cement augmentation with kyphoplasty. EXAM: FLUOROSCOPIC GUIDED KYPHOPLASTY OF THE L1 VERTEBRAL BODY COMPARISON:  CT scan lumbar spine 11/19/2021 MEDICATIONS: As antibiotic prophylaxis, 2 g Ancef was ordered pre-procedure and administered intravenously within 1 hour of incision. ANESTHESIA/SEDATION: Moderate (conscious) sedation was employed during this procedure. A total of Versed 2 mg and Fentanyl 125 mcg was administered intravenously. Moderate Sedation Time: 21 minutes. The patient's level of consciousness and vital signs were monitored continuously by radiology nursing throughout the procedure under my direct supervision. FLUOROSCOPY TIME:  5 minutes, 54  seconds (95.6 mGy) COMPLICATIONS: None immediate. TECHNIQUE: The procedure, risks (including but not limited to bleeding, infection, organ damage), benefits, and alternatives were explained to the patient. Questions regarding the procedure were encouraged and answered. The patient understands and consents to the procedure. The patient was placed prone on the fluoroscopic table. The skin overlying the upper thoracic region was then prepped and draped in the usual sterile fashion. Maximal barrier sterile technique was utilized including caps, mask, sterile gowns, sterile gloves, sterile drape, hand hygiene and skin antiseptic. Intravenous Fentanyl and Versed were administered as conscious sedation during continuous cardiorespiratory monitoring by the radiology RN. The right pedicle at L1 was then infiltrated with 1% lidocaine followed by the advancement of a Kyphon trocar needle through the left pedicle into the posterior one-third of the vertebral body. Subsequently, the osteo drill was advanced to the anterior third of the vertebral body. The osteo drill was retracted. Through the working cannula, a Kyphon inflatable bone tamp 15 x 2.5 was advanced and positioned with the distal marker approximately 5 mm from the anterior aspect of the cortex. Appropriate positioning was confirmed on the AP projection. At this time, the balloon was expanded using contrast via a Kyphon inflation syringe device via micro tubing. Inflation was continued until there was near apposition with the superior end plate. Unfortunately, there appear to be breakthrough of the balloon through the inferior endplate. Therefore, the standard Kyphon trocar was exchanged over holder pin for a directional trocar. At this time, methylmethacrylate mixture was reconstituted in the Kyphon bone mixing device system. This was then loaded into the delivery mechanism, attached to Kyphon cement delivery system. Instillation of methylmethacrylate mixture with  excellent filling in the AP and lateral projections. Small amount of extravasation of cement into the L1-L2 disc space through the defect in the inferior endplate. No evidence of extravasation posteriorly  into the spinal canal. No epidural venous contamination was seen. The working cannulae and the bone filler were then retrieved and removed. Hemostasis was achieved with manual compression. The patient tolerated the procedure well without immediate postprocedural complication. IMPRESSION: 1. Technically successful L1 vertebral body augmentation using balloon kyphoplasty. 2. Per CMS PQRS reporting requirements (PQRS Measure 24): Given the patient's age of greater than 67 and the fracture site (hip, distal radius, or spine), the patient should be tested for osteoporosis using DXA, and the appropriate treatment considered based on the DXA results. Electronically Signed   By: Jacqulynn Cadet M.D.   On: 11/28/2021 12:30    Labs:  CBC: Recent Labs    05/10/21 0000 07/03/21 0812 09/06/21 1436 10/05/21 0816  WBC 14.4* 9.9 10.3 11.0*  HGB 12.2* 12.4* 11.7* 12.8*  HCT 38.5 40.3 36.7* 41.3  PLT 352 307 263 311    COAGS: Recent Labs    12/24/20 1025  INR 1.4*    BMP: Recent Labs    01/15/21 0423 01/30/21 2122 03/01/21 0843 03/21/21 0944 05/10/21 0000 07/03/21 0812 08/03/21 0024 09/06/21 1436 10/05/21 0816  NA 140 140   < > 140   < > 141 138 139 142  K 4.3 3.8   < > 4.0   < > 4.4 4.6 4.5 4.4  CL 98 98   < > 98   < > 99 102 96 101  CO2 27 30   < > 32   < > 27 29 29 27   GLUCOSE 193* 136*   < > 108*   < > 98 106* 112* 114*  BUN 23 17   < > 12   < > 17 15 11 15   CALCIUM 8.8* 8.9   < > 9.4   < > 9.7 8.7* 9.4 9.6  CREATININE 1.01 0.68   < > 0.85   < > 0.90 0.90 0.77 0.97  GFRNONAA >60 >60  --  >60  --   --  >60  --   --    < > = values in this interval not displayed.    LIVER FUNCTION TESTS: Recent Labs    03/21/21 0944 05/10/21 0000 07/03/21 0812 10/05/21 0816  BILITOT 0.5 <0.2  0.2 <0.2  AST 27 21 24 26   ALT 27 20 25 29   ALKPHOS 59 82 73 110  PROT 7.2 6.3 6.5 6.2  ALBUMIN 3.9 4.3 4.2 4.4    TUMOR MARKERS: No results for input(s): AFPTM, CEA, CA199, CHROMGRNA in the last 8760 hours.  Assessment and Plan:  75 year old gentleman with a history of L1 compression fracture with unrelenting pain and decreased mobility.  He is now 2 weeks status post percutaneous cement augmentation with kyphoplasty and is doing very well.  His pain has reduced to a 2-3/10 compared to and 8/10 prior to augmentation.  Additionally, he has been able to increase his activities to his baseline.  Overall, he is very happy with his care.    1.)  No further scheduled follow-up.  We are available if Paul [name] develops recurrent back pain or a new fracture in the future.     Electronically Signed: Criselda Peaches 12/12/2021, 4:38 PM   I spent a total of  15 Minutes in remote  clinical consultation, greater than 50% of which was counseling/coordinating care for L1 compression fracture s/p KP.    Visit type: Audio only (telephone). Audio (no video) only due to patient preference. Alternative for in-person consultation at [facility],  [address], [place], [place]. This visit type was conducted due to national recommendations for restrictions regarding the COVID-19 Pandemic (e.g. social distancing).  This format is felt to be most appropriate for this patient at this time.  All issues noted in this document were discussed and addressed.

## 2021-12-22 DIAGNOSIS — I10 Essential (primary) hypertension: Secondary | ICD-10-CM | POA: Diagnosis not present

## 2021-12-22 DIAGNOSIS — E119 Type 2 diabetes mellitus without complications: Secondary | ICD-10-CM | POA: Diagnosis not present

## 2021-12-22 DIAGNOSIS — E1169 Type 2 diabetes mellitus with other specified complication: Secondary | ICD-10-CM

## 2021-12-22 DIAGNOSIS — I480 Paroxysmal atrial fibrillation: Secondary | ICD-10-CM | POA: Diagnosis not present

## 2021-12-22 DIAGNOSIS — I482 Chronic atrial fibrillation, unspecified: Secondary | ICD-10-CM | POA: Diagnosis not present

## 2021-12-22 DIAGNOSIS — E782 Mixed hyperlipidemia: Secondary | ICD-10-CM

## 2021-12-22 DIAGNOSIS — J449 Chronic obstructive pulmonary disease, unspecified: Secondary | ICD-10-CM | POA: Diagnosis not present

## 2021-12-27 ENCOUNTER — Emergency Department (HOSPITAL_COMMUNITY): Payer: Medicare Other

## 2021-12-27 ENCOUNTER — Encounter (HOSPITAL_COMMUNITY): Payer: Self-pay | Admitting: Emergency Medicine

## 2021-12-27 ENCOUNTER — Other Ambulatory Visit: Payer: Self-pay

## 2021-12-27 ENCOUNTER — Inpatient Hospital Stay (HOSPITAL_COMMUNITY)
Admission: EM | Admit: 2021-12-27 | Discharge: 2022-01-04 | DRG: 190 | Disposition: A | Discharge status: Home / Self Care | Payer: Medicare Other | Attending: Internal Medicine | Admitting: Internal Medicine

## 2021-12-27 ENCOUNTER — Inpatient Hospital Stay (HOSPITAL_COMMUNITY): Payer: Medicare Other

## 2021-12-27 DIAGNOSIS — M549 Dorsalgia, unspecified: Secondary | ICD-10-CM | POA: Diagnosis present

## 2021-12-27 DIAGNOSIS — Z20822 Contact with and (suspected) exposure to covid-19: Secondary | ICD-10-CM | POA: Diagnosis present

## 2021-12-27 DIAGNOSIS — K219 Gastro-esophageal reflux disease without esophagitis: Secondary | ICD-10-CM | POA: Diagnosis not present

## 2021-12-27 DIAGNOSIS — Z888 Allergy status to other drugs, medicaments and biological substances status: Secondary | ICD-10-CM

## 2021-12-27 DIAGNOSIS — I2510 Atherosclerotic heart disease of native coronary artery without angina pectoris: Secondary | ICD-10-CM | POA: Diagnosis present

## 2021-12-27 DIAGNOSIS — I5032 Chronic diastolic (congestive) heart failure: Secondary | ICD-10-CM | POA: Diagnosis present

## 2021-12-27 DIAGNOSIS — J432 Centrilobular emphysema: Secondary | ICD-10-CM | POA: Diagnosis not present

## 2021-12-27 DIAGNOSIS — R0602 Shortness of breath: Secondary | ICD-10-CM | POA: Diagnosis not present

## 2021-12-27 DIAGNOSIS — Z87891 Personal history of nicotine dependence: Secondary | ICD-10-CM

## 2021-12-27 DIAGNOSIS — Z79899 Other long term (current) drug therapy: Secondary | ICD-10-CM

## 2021-12-27 DIAGNOSIS — Z8616 Personal history of COVID-19: Secondary | ICD-10-CM | POA: Diagnosis not present

## 2021-12-27 DIAGNOSIS — I110 Hypertensive heart disease with heart failure: Secondary | ICD-10-CM | POA: Diagnosis present

## 2021-12-27 DIAGNOSIS — E1169 Type 2 diabetes mellitus with other specified complication: Secondary | ICD-10-CM | POA: Diagnosis present

## 2021-12-27 DIAGNOSIS — M8000XD Age-related osteoporosis with current pathological fracture, unspecified site, subsequent encounter for fracture with routine healing: Secondary | ICD-10-CM | POA: Diagnosis not present

## 2021-12-27 DIAGNOSIS — E785 Hyperlipidemia, unspecified: Secondary | ICD-10-CM | POA: Diagnosis not present

## 2021-12-27 DIAGNOSIS — I480 Paroxysmal atrial fibrillation: Secondary | ICD-10-CM | POA: Diagnosis not present

## 2021-12-27 DIAGNOSIS — Z7902 Long term (current) use of antithrombotics/antiplatelets: Secondary | ICD-10-CM

## 2021-12-27 DIAGNOSIS — J441 Chronic obstructive pulmonary disease with (acute) exacerbation: Secondary | ICD-10-CM | POA: Diagnosis present

## 2021-12-27 DIAGNOSIS — I700 Atherosclerosis of aorta: Secondary | ICD-10-CM | POA: Diagnosis not present

## 2021-12-27 DIAGNOSIS — Z743 Need for continuous supervision: Secondary | ICD-10-CM | POA: Diagnosis not present

## 2021-12-27 DIAGNOSIS — I10 Essential (primary) hypertension: Secondary | ICD-10-CM | POA: Diagnosis present

## 2021-12-27 DIAGNOSIS — J80 Acute respiratory distress syndrome: Secondary | ICD-10-CM | POA: Diagnosis not present

## 2021-12-27 DIAGNOSIS — D649 Anemia, unspecified: Secondary | ICD-10-CM | POA: Diagnosis present

## 2021-12-27 DIAGNOSIS — Z9981 Dependence on supplemental oxygen: Secondary | ICD-10-CM

## 2021-12-27 DIAGNOSIS — M8000XA Age-related osteoporosis with current pathological fracture, unspecified site, initial encounter for fracture: Secondary | ICD-10-CM | POA: Diagnosis present

## 2021-12-27 DIAGNOSIS — Z809 Family history of malignant neoplasm, unspecified: Secondary | ICD-10-CM

## 2021-12-27 DIAGNOSIS — E782 Mixed hyperlipidemia: Secondary | ICD-10-CM | POA: Diagnosis not present

## 2021-12-27 DIAGNOSIS — Z8249 Family history of ischemic heart disease and other diseases of the circulatory system: Secondary | ICD-10-CM

## 2021-12-27 DIAGNOSIS — M8008XD Age-related osteoporosis with current pathological fracture, vertebra(e), subsequent encounter for fracture with routine healing: Secondary | ICD-10-CM | POA: Diagnosis present

## 2021-12-27 DIAGNOSIS — J439 Emphysema, unspecified: Secondary | ICD-10-CM | POA: Diagnosis not present

## 2021-12-27 DIAGNOSIS — Z7952 Long term (current) use of systemic steroids: Secondary | ICD-10-CM

## 2021-12-27 DIAGNOSIS — R000 Tachycardia, unspecified: Secondary | ICD-10-CM | POA: Diagnosis not present

## 2021-12-27 DIAGNOSIS — J9621 Acute and chronic respiratory failure with hypoxia: Secondary | ICD-10-CM | POA: Diagnosis present

## 2021-12-27 DIAGNOSIS — Z95818 Presence of other cardiac implants and grafts: Secondary | ICD-10-CM | POA: Diagnosis not present

## 2021-12-27 DIAGNOSIS — I152 Hypertension secondary to endocrine disorders: Secondary | ICD-10-CM | POA: Diagnosis present

## 2021-12-27 DIAGNOSIS — T380X5A Adverse effect of glucocorticoids and synthetic analogues, initial encounter: Secondary | ICD-10-CM | POA: Diagnosis not present

## 2021-12-27 DIAGNOSIS — F411 Generalized anxiety disorder: Secondary | ICD-10-CM | POA: Diagnosis present

## 2021-12-27 DIAGNOSIS — G8929 Other chronic pain: Secondary | ICD-10-CM | POA: Diagnosis not present

## 2021-12-27 DIAGNOSIS — B974 Respiratory syncytial virus as the cause of diseases classified elsewhere: Secondary | ICD-10-CM | POA: Diagnosis present

## 2021-12-27 DIAGNOSIS — E1159 Type 2 diabetes mellitus with other circulatory complications: Secondary | ICD-10-CM | POA: Diagnosis present

## 2021-12-27 DIAGNOSIS — J449 Chronic obstructive pulmonary disease, unspecified: Secondary | ICD-10-CM | POA: Diagnosis not present

## 2021-12-27 DIAGNOSIS — Z7951 Long term (current) use of inhaled steroids: Secondary | ICD-10-CM | POA: Diagnosis not present

## 2021-12-27 DIAGNOSIS — J9611 Chronic respiratory failure with hypoxia: Secondary | ICD-10-CM | POA: Diagnosis present

## 2021-12-27 DIAGNOSIS — R0689 Other abnormalities of breathing: Secondary | ICD-10-CM | POA: Diagnosis not present

## 2021-12-27 DIAGNOSIS — I2584 Coronary atherosclerosis due to calcified coronary lesion: Secondary | ICD-10-CM

## 2021-12-27 DIAGNOSIS — B338 Other specified viral diseases: Secondary | ICD-10-CM | POA: Diagnosis not present

## 2021-12-27 DIAGNOSIS — Z881 Allergy status to other antibiotic agents status: Secondary | ICD-10-CM

## 2021-12-27 HISTORY — DX: Chronic obstructive pulmonary disease with (acute) exacerbation: J44.1

## 2021-12-27 LAB — BASIC METABOLIC PANEL
Anion gap: 9 (ref 5–15)
BUN: 14 mg/dL (ref 8–23)
CO2: 32 mmol/L (ref 22–32)
Calcium: 8.8 mg/dL — ABNORMAL LOW (ref 8.9–10.3)
Chloride: 98 mmol/L (ref 98–111)
Creatinine, Ser: 0.94 mg/dL (ref 0.61–1.24)
GFR, Estimated: >60 mL/min (ref >60)
Glucose, Bld: 123 mg/dL — ABNORMAL HIGH (ref 70–99)
Potassium: 4 mmol/L (ref 3.5–5.1)
Sodium: 139 mmol/L (ref 135–145)

## 2021-12-27 LAB — CBC WITH DIFFERENTIAL/PLATELET
Abs Immature Granulocytes: 0.03 10*3/uL (ref 0.00–0.07)
Basophils Absolute: 0 10*3/uL (ref 0.0–0.1)
Basophils Relative: 0 %
Eosinophils Absolute: 0.1 10*3/uL (ref 0.0–0.5)
Eosinophils Relative: 1 %
HCT: 42.6 % (ref 39.0–52.0)
Hemoglobin: 13 g/dL (ref 13.0–17.0)
Immature Granulocytes: 0 %
Lymphocytes Relative: 6 %
Lymphs Abs: 0.6 10*3/uL — ABNORMAL LOW (ref 0.7–4.0)
MCH: 25 pg — ABNORMAL LOW (ref 26.0–34.0)
MCHC: 30.5 g/dL (ref 30.0–36.0)
MCV: 81.8 fL (ref 80.0–100.0)
Monocytes Absolute: 0.7 10*3/uL (ref 0.1–1.0)
Monocytes Relative: 7 %
Neutro Abs: 9.1 10*3/uL — ABNORMAL HIGH (ref 1.7–7.7)
Neutrophils Relative %: 86 %
Platelets: 242 10*3/uL (ref 150–400)
RBC: 5.21 MIL/uL (ref 4.22–5.81)
RDW: 15.9 % — ABNORMAL HIGH (ref 11.5–15.5)
WBC: 10.6 10*3/uL — ABNORMAL HIGH (ref 4.0–10.5)
nRBC: 0 % (ref 0.0–0.2)

## 2021-12-27 LAB — HIV ANTIBODY (ROUTINE TESTING W REFLEX): HIV Screen 4th Generation wRfx: NONREACTIVE

## 2021-12-27 LAB — RESP PANEL BY RT-PCR (FLU A&B, COVID) ARPGX2
Influenza A by PCR: NEGATIVE
Influenza B by PCR: NEGATIVE
SARS Coronavirus 2 by RT PCR: NEGATIVE

## 2021-12-27 LAB — CBC
HCT: 39.8 % (ref 39.0–52.0)
Hemoglobin: 12.1 g/dL — ABNORMAL LOW (ref 13.0–17.0)
MCH: 24.9 pg — ABNORMAL LOW (ref 26.0–34.0)
MCHC: 30.4 g/dL (ref 30.0–36.0)
MCV: 81.9 fL (ref 80.0–100.0)
Platelets: 235 10*3/uL (ref 150–400)
RBC: 4.86 MIL/uL (ref 4.22–5.81)
RDW: 16 % — ABNORMAL HIGH (ref 11.5–15.5)
WBC: 8.6 10*3/uL (ref 4.0–10.5)
nRBC: 0 % (ref 0.0–0.2)

## 2021-12-27 LAB — CBG MONITORING, ED: Glucose-Capillary: 155 mg/dL — ABNORMAL HIGH (ref 70–99)

## 2021-12-27 LAB — HEMOGLOBIN A1C
Hgb A1c MFr Bld: 6.5 % — ABNORMAL HIGH (ref 4.8–5.6)
Mean Plasma Glucose: 139.85 mg/dL

## 2021-12-27 LAB — BRAIN NATRIURETIC PEPTIDE: B Natriuretic Peptide: 70.2 pg/mL (ref 0.0–100.0)

## 2021-12-27 MED ORDER — SODIUM CHLORIDE 0.9 % IV SOLN
100.0000 mg | Freq: Once | INTRAVENOUS | Status: AC
  Administered 2021-12-27: 100 mg via INTRAVENOUS
  Filled 2021-12-27: qty 100

## 2021-12-27 MED ORDER — LACTATED RINGERS IV SOLN: INTRAVENOUS | Status: DC

## 2021-12-27 MED ORDER — ATORVASTATIN CALCIUM 40 MG PO TABS
40.0000 mg | ORAL_TABLET | Freq: Every day | ORAL | Status: DC
  Administered 2021-12-28 – 2022-01-03 (×7): 40 mg via ORAL
  Filled 2021-12-27 (×9): qty 1

## 2021-12-27 MED ORDER — ACETAMINOPHEN 325 MG PO TABS: 650.0000 mg | ORAL_TABLET | Freq: Four times a day (QID) | ORAL | Status: DC | PRN

## 2021-12-27 MED ORDER — IPRATROPIUM BROMIDE 0.02 % IN SOLN
0.5000 mg | Freq: Once | RESPIRATORY_TRACT | Status: AC
  Administered 2021-12-27: 0.5 mg via RESPIRATORY_TRACT
  Filled 2021-12-27: qty 2.5

## 2021-12-27 MED ORDER — ENOXAPARIN SODIUM 40 MG/0.4ML IJ SOSY
40.0000 mg | PREFILLED_SYRINGE | INTRAMUSCULAR | Status: DC
  Administered 2021-12-27 – 2022-01-03 (×8): 40 mg via SUBCUTANEOUS
  Filled 2021-12-27 (×8): qty 0.4

## 2021-12-27 MED ORDER — FERROUS SULFATE 325 (65 FE) MG PO TABS
325.0000 mg | ORAL_TABLET | Freq: Two times a day (BID) | ORAL | Status: DC
  Administered 2021-12-27 – 2022-01-04 (×16): 325 mg via ORAL
  Filled 2021-12-27 (×16): qty 1

## 2021-12-27 MED ORDER — CLOPIDOGREL BISULFATE 75 MG PO TABS
75.0000 mg | ORAL_TABLET | Freq: Every day | ORAL | Status: DC
  Administered 2021-12-28 – 2022-01-04 (×8): 75 mg via ORAL
  Filled 2021-12-27 (×9): qty 1

## 2021-12-27 MED ORDER — ALBUTEROL SULFATE HFA 108 (90 BASE) MCG/ACT IN AERS
2.0000 | INHALATION_SPRAY | RESPIRATORY_TRACT | Status: DC | PRN
  Filled 2021-12-27: qty 6.7

## 2021-12-27 MED ORDER — FUROSEMIDE 40 MG PO TABS: 40.0000 mg | ORAL_TABLET | Freq: Every day | ORAL | Status: DC

## 2021-12-27 MED ORDER — ALBUTEROL SULFATE (2.5 MG/3ML) 0.083% IN NEBU
2.5000 mg | INHALATION_SOLUTION | RESPIRATORY_TRACT | Status: DC | PRN
  Filled 2021-12-27: qty 3

## 2021-12-27 MED ORDER — PANTOPRAZOLE SODIUM 40 MG PO TBEC: 40.0000 mg | DELAYED_RELEASE_TABLET | Freq: Every day | ORAL | Status: DC

## 2021-12-27 MED ORDER — MOMETASONE FURO-FORMOTEROL FUM 200-5 MCG/ACT IN AERO
2.0000 | INHALATION_SPRAY | Freq: Two times a day (BID) | RESPIRATORY_TRACT | Status: DC
  Administered 2021-12-27: 2 via RESPIRATORY_TRACT
  Filled 2021-12-27: qty 8.8

## 2021-12-27 MED ORDER — UMECLIDINIUM BROMIDE 62.5 MCG/ACT IN AEPB
1.0000 | INHALATION_SPRAY | Freq: Every day | RESPIRATORY_TRACT | Status: DC
  Administered 2021-12-27: 1 via RESPIRATORY_TRACT
  Filled 2021-12-27: qty 7

## 2021-12-27 MED ORDER — LORATADINE 10 MG PO TABS
10.0000 mg | ORAL_TABLET | Freq: Every morning | ORAL | Status: DC
  Administered 2021-12-29 – 2022-01-03 (×6): 10 mg via ORAL
  Filled 2021-12-27 (×7): qty 1

## 2021-12-27 MED ORDER — BISOPROLOL FUMARATE 5 MG PO TABS
5.0000 mg | ORAL_TABLET | Freq: Every day | ORAL | Status: DC
  Administered 2021-12-28 – 2022-01-04 (×8): 5 mg via ORAL
  Filled 2021-12-27 (×8): qty 1

## 2021-12-27 MED ORDER — ALBUTEROL (5 MG/ML) CONTINUOUS INHALATION SOLN: 10.0000 mg/h | INHALATION_SOLUTION | Freq: Once | RESPIRATORY_TRACT | Status: DC

## 2021-12-27 MED ORDER — ALBUTEROL SULFATE (2.5 MG/3ML) 0.083% IN NEBU
2.5000 mg | INHALATION_SOLUTION | Freq: Once | RESPIRATORY_TRACT | Status: AC
  Administered 2021-12-27: 2.5 mg via RESPIRATORY_TRACT
  Filled 2021-12-27: qty 3

## 2021-12-27 MED ORDER — SERTRALINE HCL 50 MG PO TABS
50.0000 mg | ORAL_TABLET | Freq: Every day | ORAL | Status: DC
  Administered 2021-12-28 – 2022-01-03 (×7): 50 mg via ORAL
  Filled 2021-12-27 (×8): qty 1

## 2021-12-27 MED ORDER — OXYCODONE HCL 5 MG PO TABS
5.0000 mg | ORAL_TABLET | Freq: Four times a day (QID) | ORAL | Status: DC | PRN
  Administered 2021-12-28 – 2022-01-03 (×8): 5 mg via ORAL
  Filled 2021-12-27 (×9): qty 1

## 2021-12-27 MED ORDER — GUAIFENESIN ER 600 MG PO TB12
600.0000 mg | ORAL_TABLET | Freq: Two times a day (BID) | ORAL | Status: DC
  Administered 2021-12-27 – 2022-01-04 (×16): 600 mg via ORAL
  Filled 2021-12-27 (×17): qty 1

## 2021-12-27 MED ORDER — VITAMIN D 25 MCG (1000 UNIT) PO TABS
2000.0000 [IU] | ORAL_TABLET | Freq: Every day | ORAL | Status: DC
  Administered 2021-12-28 – 2022-01-04 (×8): 2000 [IU] via ORAL
  Filled 2021-12-27 (×8): qty 2

## 2021-12-27 MED ORDER — PANTOPRAZOLE SODIUM 40 MG PO TBEC
40.0000 mg | DELAYED_RELEASE_TABLET | Freq: Every day | ORAL | Status: DC
  Administered 2021-12-28 – 2021-12-29 (×2): 40 mg via ORAL
  Filled 2021-12-27 (×2): qty 1

## 2021-12-27 MED ORDER — ASCORBIC ACID 500 MG PO TABS
1000.0000 mg | ORAL_TABLET | Freq: Every day | ORAL | Status: DC
  Administered 2021-12-28 – 2022-01-04 (×8): 1000 mg via ORAL
  Filled 2021-12-27 (×9): qty 2

## 2021-12-27 MED ORDER — IPRATROPIUM-ALBUTEROL 0.5-2.5 (3) MG/3ML IN SOLN
3.0000 mL | Freq: Four times a day (QID) | RESPIRATORY_TRACT | Status: DC
  Administered 2021-12-27 – 2021-12-28 (×3): 3 mL via RESPIRATORY_TRACT
  Filled 2021-12-27 (×3): qty 3

## 2021-12-27 MED ORDER — INSULIN ASPART 100 UNIT/ML IJ SOLN
0.0000 [IU] | Freq: Three times a day (TID) | INTRAMUSCULAR | Status: DC
  Administered 2021-12-27 – 2021-12-28 (×2): 3 [IU] via SUBCUTANEOUS
  Administered 2021-12-29 – 2021-12-31 (×8): 2 [IU] via SUBCUTANEOUS
  Administered 2022-01-01: 1 [IU] via SUBCUTANEOUS
  Administered 2022-01-02 – 2022-01-03 (×3): 2 [IU] via SUBCUTANEOUS
  Administered 2022-01-03 (×2): 3 [IU] via SUBCUTANEOUS
  Filled 2021-12-27: qty 0.15

## 2021-12-27 MED ORDER — TIZANIDINE HCL 4 MG PO TABS: 2.0000 mg | ORAL_TABLET | Freq: Four times a day (QID) | ORAL | Status: DC | PRN

## 2021-12-27 MED ORDER — CALCIUM CARBONATE 1250 (500 CA) MG PO TABS
1.0000 | ORAL_TABLET | Freq: Every day | ORAL | Status: DC
  Administered 2021-12-29 – 2022-01-04 (×7): 500 mg via ORAL
  Filled 2021-12-27 (×9): qty 1

## 2021-12-27 MED ORDER — ADULT MULTIVITAMIN W/MINERALS CH
1.0000 | ORAL_TABLET | Freq: Every day | ORAL | Status: DC
  Administered 2021-12-28 – 2022-01-04 (×8): 1 via ORAL
  Filled 2021-12-27 (×9): qty 1

## 2021-12-27 MED ORDER — FUROSEMIDE 40 MG PO TABS
40.0000 mg | ORAL_TABLET | Freq: Every day | ORAL | Status: DC
  Administered 2021-12-28 – 2022-01-04 (×8): 40 mg via ORAL
  Filled 2021-12-27 (×8): qty 1

## 2021-12-27 MED ORDER — METHYLPREDNISOLONE SODIUM SUCC 40 MG IJ SOLR
40.0000 mg | Freq: Every day | INTRAMUSCULAR | Status: DC
  Administered 2021-12-27: 40 mg via INTRAVENOUS
  Filled 2021-12-27: qty 1

## 2021-12-27 MED ORDER — SODIUM CHLORIDE 0.9 % IV SOLN
500.0000 mg | INTRAVENOUS | Status: DC
  Administered 2021-12-27 – 2021-12-31 (×5): 500 mg via INTRAVENOUS
  Filled 2021-12-27 (×5): qty 5

## 2021-12-27 MED ORDER — BUDESON-GLYCOPYRROL-FORMOTEROL 160-9-4.8 MCG/ACT IN AERO: 2.0000 | INHALATION_SPRAY | Freq: Two times a day (BID) | RESPIRATORY_TRACT | Status: DC

## 2021-12-27 NOTE — Assessment & Plan Note (Addendum)
Continue home Lasix 40 mg p.o. daily, not currently on ACE inhibitor, continue beta-blocker.  Reviewed last echo, Grade I diastolic df and EF 60 to 14% in 08/2021, BNP pending and if significantly elevated would get Echo but SOB seems to be more related to COPD at this point so if BNP normal/low would just monitor fluid status, patient currently getting fluids but does not appear overloaded, daily weights and I&O ordered, repeat CXR in AM

## 2021-12-27 NOTE — Assessment & Plan Note (Addendum)
No current home meds, will add A1c to labs, no significant hyperglycemia at this point, SSI ordered

## 2021-12-27 NOTE — Assessment & Plan Note (Signed)
See above, COPD.  Patient is on home oxygen

## 2021-12-27 NOTE — Assessment & Plan Note (Signed)
BP currently controlled, continue home medications with bisoprolol 5 mg daily

## 2021-12-27 NOTE — Assessment & Plan Note (Signed)
-   Continue home PPI °

## 2021-12-27 NOTE — Assessment & Plan Note (Signed)
Continue statin. 

## 2021-12-27 NOTE — Assessment & Plan Note (Addendum)
Current EKG appears in sinus rhythm, patient is status post watchman device placement on 08/2021 and has been on Plavix and not taking anticoagulation, will continue Plavix and telemetry, continue beta-blocker, no significant anemia and no s/s bleeding, lovenox for DVT ppx

## 2021-12-27 NOTE — ED Triage Notes (Signed)
Pt BIBA from home c/o SOB since last night. Pt uses 2 L [place] at bedtime, reports needing to increase O2. Hx COPD. Denies chest pain.  BP 132/74 HR 122 CBG 157   95% 5 L [place]

## 2021-12-27 NOTE — Assessment & Plan Note (Signed)
   Continue home Zoloft 

## 2021-12-27 NOTE — Assessment & Plan Note (Addendum)
Several compression fractures on x-ray, no acute issues noted, on Prolia as outpatient, prn pain meds ordered w/ acetaminophen for mild/moderate and oxycodone 5 mg q6h for severe pain

## 2021-12-27 NOTE — H&P (Addendum)
Paul [name] is an 76 y.o. male.   Chief Complaint:  Chief Complaint  Patient presents with   Shortness of Breath    HPI: Pleasant 76 year old gentleman with history of COPD, severe, on home oxygen, presenting to ED with about 1 day of increasing shortness of breath and increasing mucus production/cough.  Reports fever at home but has been afebrile here.  Stable history of paroxysmal atrial fibrillation status post watchman atrial appendage device placed 08/2021 and currently on Plavix, no signs/symptoms of bleeding, current EKG shows normal sinus rhythm and patient is not complaining of palpitations.  Reports compliance with home medications including inhalers.  Hospital course: 12/27/21 in emergency department, 70% SPO2 on ambulation with oxygen, white count very slightly above normal at 10.6, BP okay, tachypneic.  Chest x-ray not concerning for pneumonia, EKG showed sinus rhythm.  Patient received albuterol, doxycycline, lactated Ringer's, second albuterol treatment.  Oxygen continue to be low down to 78% on walking and patient still feeling significantly short of breath.  Admitted for treatment COPD exacerbation with underlying severe COPD.  Significant wheeze/rhonchi in all lung fields, no appreciable rales, no apparent fluid overload    ASSESSMENT & PLAN:  COPD with acute exacerbation (HCC) Severe underlying COPD, home on home O2 and following with pulmonary.  Presents today with exacerbation.  Continue supportive oxygen, escalate as needed, BiPAP ordered as needed.  Steroids, azithro, sputum culture pending, continue DuoNeb scheduled, albuterol as needed.  Continue home maintenance inhaler with Breztri. Repeat CXR in AM, if developing PNA would escalate abx  Paroxysmal atrial fibrillation (HCC) Current EKG appears in sinus rhythm, patient is status post watchman device placement on 08/2021 and has been on Plavix and not taking anticoagulation, will continue Plavix and telemetry,  continue beta-blocker, no significant anemia and no s/s bleeding, lovenox for DVT ppx   Chronic respiratory failure with hypoxia ([name]) See above, COPD.  Patient is on home oxygen  HLD (hyperlipidemia) Continue statin  Coronary artery calcification Continue statin  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HCC) No current home meds, will add A1c to labs, no significant hyperglycemia at this point, SSI ordered  Chronic diastolic CHF (congestive heart failure) (HCC) Continue home Lasix 40 mg p.o. daily, not currently on ACE inhibitor, continue beta-blocker.  Reviewed last echo, Grade I diastolic df and EF 60 to 79% in 08/2021, BNP pending and if significantly elevated would get Echo but SOB seems to be more related to COPD at this point so if BNP normal/low would just monitor fluid status, patient currently getting fluids but does not appear overloaded, daily weights and I&O ordered, repeat CXR in AM  Essential hypertension BP currently controlled, continue home medications with bisoprolol 5 mg daily  Age-related osteoporosis with healing pathological fracture, chronic back pain Several compression fractures on x-ray, no acute issues noted, on Prolia as outpatient, prn pain meds ordered w/ acetaminophen for mild/moderate and oxycodone 5 mg q6h for severe pain  GERD (gastroesophageal reflux disease) / GI protection on antiplatelet therapy Continue home PPI  GAD (generalized anxiety disorder) Continue home Zoloft   VTE Ppx: lovenox, monitor closely for bleeding given he's on antiplatelet as well for Watchman device placed 08/2021, continue PPI CODE: FULL - discussed CODE STATUS with patient at bedside, he is alert/oriented and has capacity to make own decisions.  Wife at bedside, patient designates her as medical decision-maker if he is incapacitated Dispo: Home once medically stable, assess home health needs Family communication: Wife at bedside, all questions  answered      Past Medical History:  Diagnosis Date   Anemia    Aortic atherosclerosis (HCC)    Atrial fibrillation (HCC)    Back pain    COPD (chronic obstructive pulmonary disease) ([place])    COVID-19 12/24/2020   Diabetes mellitus without complication (HCC)    Elevated PSA    GAD (generalized anxiety disorder)    GERD (gastroesophageal reflux disease)    High cholesterol    Hypertension    Lingular pneumonia 01/05/2021   See cxr 01/06/20 rx with zpak/ cefipime x 5 days and then developed purulent sputum again 01/12/21 so rx levcaquin 500 mg daily x 7 days then return for cxr before more    Lung nodule < 6cm on CT 05/29/2016   Osteoporosis    Oxygen deficiency    Pneumonia    January 2022   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 Quentin Ore with a 20 mm Watchman FLX device   Vertebral compression fracture ([place]) 05/29/2019   T8 compression fracture noted on CT scan 05/28/2019 inpatient with chronic steroid dependent COPD - rx calcitonin nasal spray rx per PCP     Past Surgical History:  Procedure Laterality Date   CATARACT EXTRACTION Right    IR KYPHO EA ADDL LEVEL THORACIC OR LUMBAR  11/28/2021   LEFT ATRIAL APPENDAGE OCCLUSION N/A 08/02/2021   Procedure: LEFT ATRIAL APPENDAGE OCCLUSION;  Surgeon: Sherren Mocha, MD;  Location: [facility];  Service: Cardiovascular;  Laterality: N/A;   SKIN SURGERY     shoulders and chest   TEE WITHOUT CARDIOVERSION N/A 08/02/2021   Procedure: TRANSESOPHAGEAL ECHOCARDIOGRAM (TEE);  Surgeon: Sherren Mocha, MD;  Location: [facility];  Service: Cardiovascular;  Laterality: N/A;   TEE WITHOUT CARDIOVERSION N/A 09/13/2021   Procedure: TRANSESOPHAGEAL ECHOCARDIOGRAM (TEE);  Surgeon: Sanda Klein, MD;  Location: [facility];  Service: Cardiovascular;  Laterality: N/A;    Family History  Problem Relation Age of Onset   Heart disease Brother    Heart disease Mother    Heart disease Father     Cancer Paternal Uncle    Social History:  reports that he quit smoking about 12 years ago. His smoking use included cigarettes. He has a 104.00 pack-year smoking history. He has never used smokeless tobacco. He reports that he does not currently use alcohol. He reports that he does not use drugs.  Allergies:  Allergies  Allergen Reactions   Daliresp [Roflumilast] Diarrhea and Nausea And Vomiting   Levaquin [Levofloxacin] Palpitations    Made the B/P fluctuate and heart raced   Alendronate Anxiety and Other (See Comments)    Jittery/nervous    (Not in a hospital admission)   Results for orders placed or performed during the hospital encounter of 12/27/21 (from the past 48 hour(s))  Resp Panel by RT-PCR (Flu A&B, Covid) Nasopharyngeal Swab     Status: None   Collection Time: 12/27/21  9:24 AM   Specimen: Nasopharyngeal Swab; Nasopharyngeal(NP) swabs in vial transport medium  Result Value Ref Range   SARS Coronavirus 2 by RT PCR NEGATIVE NEGATIVE    Comment: (NOTE) SARS-CoV-2 target nucleic acids are NOT DETECTED.  The SARS-CoV-2 RNA is generally detectable in upper respiratory specimens during the acute phase of infection. The lowest concentration of SARS-CoV-2 viral copies this assay can detect is 138 copies/mL. A negative result does not preclude SARS-Cov-2 infection and should not be used as the sole basis for  treatment or other patient management decisions. A negative result may occur with  improper specimen collection/handling, submission of specimen other than nasopharyngeal swab, presence of viral mutation(s) within the areas targeted by this assay, and inadequate number of viral copies(<138 copies/mL). A negative result must be combined with clinical observations, patient history, and epidemiological information. The expected result is Negative.  Fact Sheet for Patients:  EntrepreneurPulse.com.au  Fact Sheet for Healthcare Providers:   IncredibleEmployment.be  This test is no t yet approved or cleared by the [place] FDA and  has been authorized for detection and/or diagnosis of SARS-CoV-2 by FDA under an Emergency Use Authorization (EUA). This EUA will remain  in effect (meaning this test can be used) for the duration of the COVID-19 declaration under Section 564(b)(1) of the Act, 21 U.S.C.section 360bbb-3(b)(1), unless the authorization is terminated  or revoked sooner.       Influenza A by PCR NEGATIVE NEGATIVE   Influenza B by PCR NEGATIVE NEGATIVE    Comment: (NOTE) The Xpert Xpress SARS-CoV-2/FLU/RSV plus assay is intended as an aid in the diagnosis of influenza from Nasopharyngeal swab specimens and should not be used as a sole basis for treatment. Nasal washings and aspirates are unacceptable for Xpert Xpress SARS-CoV-2/FLU/RSV testing.  Fact Sheet for Patients: EntrepreneurPulse.com.au  Fact Sheet for Healthcare Providers: IncredibleEmployment.be  This test is not yet approved or cleared by the [place] FDA and has been authorized for detection and/or diagnosis of SARS-CoV-2 by FDA under an Emergency Use Authorization (EUA). This EUA will remain in effect (meaning this test can be used) for the duration of the COVID-19 declaration under Section 564(b)(1) of the Act, 21 U.S.C. section 360bbb-3(b)(1), unless the authorization is terminated or revoked.  Performed at [facility], [place] [address] [place], [place] [address]   Basic metabolic panel     Status: Abnormal   Collection Time: 12/27/21  9:24 AM  Result Value Ref Range   Sodium 139 135 - 145 mmol/L   Potassium 4.0 3.5 - 5.1 mmol/L   Chloride 98 98 - 111 mmol/L   CO2 32 22 - 32 mmol/L   Glucose, Bld 123 (H) 70 - 99 mg/dL    Comment: Glucose reference range applies only to samples taken after fasting for at least 8 hours.   BUN 14 8 - 23 mg/dL   Creatinine,  Ser 0.94 0.61 - 1.24 mg/dL   Calcium 8.8 (L) 8.9 - 10.3 mg/dL   GFR, Estimated >60 >60 mL/min    Comment: (NOTE) Calculated using the CKD-EPI Creatinine Equation (2021)    Anion gap 9 5 - 15    Comment: Performed at [facility], [place] [address], [place], [place] [address]  CBC with Differential/Platelet     Status: Abnormal   Collection Time: 12/27/21  9:24 AM  Result Value Ref Range   WBC 10.6 (H) 4.0 - 10.5 K/uL   RBC 5.21 4.22 - 5.81 MIL/uL   Hemoglobin 13.0 13.0 - 17.0 g/dL   HCT 42.6 39.0 - 52.0 %   MCV 81.8 80.0 - 100.0 fL   MCH 25.0 (L) 26.0 - 34.0 pg   MCHC 30.5 30.0 - 36.0 g/dL   RDW 15.9 (H) 11.5 - 15.5 %   Platelets 242 150 - 400 K/uL   nRBC 0.0 0.0 - 0.2 %   Neutrophils Relative % 86 %   Neutro Abs 9.1 (H) 1.7 - 7.7 K/uL   Lymphocytes Relative 6 %   Lymphs Abs  0.6 (L) 0.7 - 4.0 K/uL   Monocytes Relative 7 %   Monocytes Absolute 0.7 0.1 - 1.0 K/uL   Eosinophils Relative 1 %   Eosinophils Absolute 0.1 0.0 - 0.5 K/uL   Basophils Relative 0 %   Basophils Absolute 0.0 0.0 - 0.1 K/uL   Immature Granulocytes 0 %   Abs Immature Granulocytes 0.03 0.00 - 0.07 K/uL    Comment: Performed at [facility], [place] [address] [place], [place] [address]   DG Chest 2 View  Result Date: 12/27/2021 CLINICAL DATA:  SOB EXAM: CHEST - 2 VIEW COMPARISON:  08/02/2021 FINDINGS: No consolidation. No visible pleural effusions or pneumothorax. Cardiomediastinal silhouette is within normal limits and similar to prior. L1 compression fracture with approximately 50% height loss and kyphoplasty, which is new since 08/02/2021 but similar in appearance to intraoperative fluoroscopic imaging from kyphoplasty on 11/28/2021. Multiple additional compression fractures in thoracic spine appears similar. IMPRESSION: No evidence of acute cardiopulmonary disease. Electronically Signed   By: Margaretha Sheffield M.D.   On: 12/27/2021 09:05    Review of Systems  Constitutional:   Positive for activity change and fever. Negative for chills.  HENT:  Positive for congestion. Negative for trouble swallowing.   Respiratory:  Positive for cough, chest tightness, shortness of breath and wheezing. Negative for apnea.   Cardiovascular:  Negative for chest pain, palpitations and leg swelling.  Gastrointestinal:  Negative for anal bleeding, constipation and diarrhea.  Genitourinary:  Negative for difficulty urinating.  Musculoskeletal:  Positive for back pain (chronic).  Neurological:  Negative for dizziness and syncope.  Hematological:  Does not bruise/bleed easily.   Blood pressure 122/66, pulse (!) 107, temperature 99.7 F (37.6 C), temperature source Oral, resp. rate 16, SpO2 100 %. Physical Exam Constitutional:      General: He is not in acute distress.    Appearance: He is well-developed. He is obese. He is not toxic-appearing.  HENT:     Head: Normocephalic and atraumatic.  Eyes:     Extraocular Movements: Extraocular movements intact.  Cardiovascular:     Rate and Rhythm: Normal rate and regular rhythm.  Pulmonary:     Effort: Accessory muscle usage present.     Breath sounds: Examination of the right-upper field reveals wheezing and rhonchi. Examination of the left-upper field reveals wheezing and rhonchi. Examination of the right-middle field reveals wheezing and rhonchi. Examination of the left-middle field reveals wheezing and rhonchi. Examination of the right-lower field reveals wheezing and rhonchi. Examination of the left-lower field reveals wheezing and rhonchi. Wheezing and rhonchi present.  Abdominal:     General: Bowel sounds are normal.     Palpations: Abdomen is soft.  Musculoskeletal:     Cervical back: Normal range of motion.     Right lower leg: No edema.     Left lower leg: No edema.  Neurological:     General: No focal deficit present.     Mental Status: He is alert and oriented to person, place, and time.     Motor: No weakness.   Psychiatric:        Mood and Affect: Mood is anxious.        Behavior: Behavior normal. Behavior is not agitated.     Emeterio Reeve, DO 12/27/2021, 12:50 PM

## 2021-12-27 NOTE — Assessment & Plan Note (Addendum)
Severe underlying COPD, home on home O2 and following with pulmonary.  Presents today with exacerbation.  Continue supportive oxygen, escalate as needed, BiPAP ordered as needed.  Steroids, azithro, sputum culture pending, continue DuoNeb scheduled, albuterol as needed.  Continue home maintenance inhaler with Breztri. Repeat CXR in AM, if developing PNA would escalate abx

## 2021-12-27 NOTE — Progress Notes (Signed)
° ° °  This pt is an active pt under [facility]- A home based Palliative Care program provided by [facility].   The pt is currently receiving services of nursing and SW.   Please reach out with questions or concerns.  Webb Silversmith RN  [phone]

## 2021-12-28 ENCOUNTER — Inpatient Hospital Stay (HOSPITAL_COMMUNITY): Payer: Medicare Other

## 2021-12-28 DIAGNOSIS — J441 Chronic obstructive pulmonary disease with (acute) exacerbation: Secondary | ICD-10-CM | POA: Diagnosis not present

## 2021-12-28 DIAGNOSIS — J9621 Acute and chronic respiratory failure with hypoxia: Secondary | ICD-10-CM

## 2021-12-28 LAB — BASIC METABOLIC PANEL
Anion gap: 8 (ref 5–15)
BUN: 18 mg/dL (ref 8–23)
CO2: 27 mmol/L (ref 22–32)
Calcium: 7.4 mg/dL — ABNORMAL LOW (ref 8.9–10.3)
Chloride: 104 mmol/L (ref 98–111)
Creatinine, Ser: 0.78 mg/dL (ref 0.61–1.24)
GFR, Estimated: >60 mL/min (ref >60)
Glucose, Bld: 108 mg/dL — ABNORMAL HIGH (ref 70–99)
Potassium: 3.5 mmol/L (ref 3.5–5.1)
Sodium: 139 mmol/L (ref 135–145)

## 2021-12-28 LAB — CBC
HCT: 37.5 % — ABNORMAL LOW (ref 39.0–52.0)
Hemoglobin: 11.5 g/dL — ABNORMAL LOW (ref 13.0–17.0)
MCH: 25.2 pg — ABNORMAL LOW (ref 26.0–34.0)
MCHC: 30.7 g/dL (ref 30.0–36.0)
MCV: 82.1 fL (ref 80.0–100.0)
Platelets: 229 10*3/uL (ref 150–400)
RBC: 4.57 MIL/uL (ref 4.22–5.81)
RDW: 16.1 % — ABNORMAL HIGH (ref 11.5–15.5)
WBC: 8.4 10*3/uL (ref 4.0–10.5)
nRBC: 0 % (ref 0.0–0.2)

## 2021-12-28 LAB — CBG MONITORING, ED
Glucose-Capillary: 99 mg/dL (ref 70–99)
Glucose-Capillary: 106 mg/dL — ABNORMAL HIGH (ref 70–99)
Glucose-Capillary: 193 mg/dL — ABNORMAL HIGH (ref 70–99)
Glucose-Capillary: 132 mg/dL — ABNORMAL HIGH (ref 70–99)

## 2021-12-28 MED ORDER — REVEFENACIN 175 MCG/3ML IN SOLN
175.0000 ug | Freq: Every day | RESPIRATORY_TRACT | Status: DC
  Administered 2021-12-29 – 2022-01-04 (×6): 175 ug via RESPIRATORY_TRACT
  Filled 2021-12-28 (×8): qty 3

## 2021-12-28 MED ORDER — CHLORHEXIDINE GLUCONATE CLOTH 2 % EX PADS
6.0000 | MEDICATED_PAD | Freq: Every day | CUTANEOUS | Status: DC
  Administered 2021-12-29 – 2021-12-31 (×3): 6 via TOPICAL

## 2021-12-28 MED ORDER — ARFORMOTEROL TARTRATE 15 MCG/2ML IN NEBU
15.0000 ug | INHALATION_SOLUTION | Freq: Two times a day (BID) | RESPIRATORY_TRACT | Status: DC
  Administered 2021-12-28 – 2022-01-04 (×14): 15 ug via RESPIRATORY_TRACT
  Filled 2021-12-28 (×15): qty 2

## 2021-12-28 MED ORDER — BUDESONIDE 0.5 MG/2ML IN SUSP
0.5000 mg | Freq: Two times a day (BID) | RESPIRATORY_TRACT | Status: DC
  Administered 2021-12-28 – 2022-01-04 (×14): 0.5 mg via RESPIRATORY_TRACT
  Filled 2021-12-28 (×14): qty 2

## 2021-12-28 MED ORDER — ORAL CARE MOUTH RINSE
15.0000 mL | Freq: Two times a day (BID) | OROMUCOSAL | Status: DC
  Administered 2021-12-29 – 2022-01-03 (×11): 15 mL via OROMUCOSAL

## 2021-12-28 MED ORDER — MELATONIN 5 MG PO TABS
5.0000 mg | ORAL_TABLET | Freq: Every evening | ORAL | Status: DC | PRN
  Administered 2021-12-28 – 2022-01-03 (×7): 5 mg via ORAL
  Filled 2021-12-28 (×7): qty 1

## 2021-12-28 MED ORDER — METHYLPREDNISOLONE SODIUM SUCC 125 MG IJ SOLR
60.0000 mg | Freq: Three times a day (TID) | INTRAMUSCULAR | Status: DC
  Administered 2021-12-28 – 2022-01-03 (×19): 60 mg via INTRAVENOUS
  Filled 2021-12-28 (×15): qty 2

## 2021-12-28 MED ORDER — IPRATROPIUM-ALBUTEROL 0.5-2.5 (3) MG/3ML IN SOLN
3.0000 mL | RESPIRATORY_TRACT | Status: DC | PRN
  Administered 2021-12-28 (×2): 3 mL via RESPIRATORY_TRACT
  Filled 2021-12-28 (×2): qty 3

## 2021-12-28 NOTE — Consult Note (Signed)
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  He is a patient of Dr. Melvyn Novas in the pulmonary clinic. Admitted with COPD exacerbation.  Started on BiPAP in the ED and PCCM consulted for help with  Of note he is on home palliative care but remains full code  Pertinent  Medical History    has a past medical history of Anemia, Aortic atherosclerosis ([place]), Atrial fibrillation ([place]), Back pain, COPD (chronic obstructive pulmonary disease) ([place]), COVID-19 (12/24/2020), Diabetes mellitus without complication ([place]), Elevated PSA, GAD (generalized anxiety disorder), GERD (gastroesophageal reflux disease), High cholesterol, Hypertension, Lingular pneumonia (01/05/2021), Lung nodule < 6cm on CT (05/29/2016), Osteoporosis, Oxygen deficiency, Pneumonia, 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   Significant Hospital Events: Including procedures, antibiotic start and stop dates in addition to other pertinent events   1/6-admit  Interim History / Subjective:   Started on BiPAP today morning.  Appears comfortable.  States that breathing is better  Objective   Blood pressure (!) 142/58, pulse (!) 123, temperature 98 F (36.7 C), temperature source Oral, resp. rate (!) 29, SpO2 100 %.        Intake/Output Summary (Last 24 hours) at 12/28/2021 0818 Last data filed at 12/28/2021 7017 Gross per 24 hour  Intake 512.5 ml  Output 1000 ml  Net -487.5 ml   There were no vitals filed for this visit.  Examination: Gen:      Mild distress, on BiPAP HEENT:  EOMI, sclera anicteric Neck:     No masses; no thyromegaly Lungs:    Diminished breath sounds.  No wheeze effusion CV:          Regular rate and rhythm; no murmurs Abd:      + bowel sounds; soft, non-tender; no palpable masses, no distension Ext:    No edema; adequate peripheral perfusion Skin:      Warm and dry; no rash Neuro: alert and oriented x 3 Psych: normal mood and affect   Lab/imaging reviewed ABG significant for 7.46, PCO2 54, PO2 793 Metabolic panel is stable Hemoglobin 11.5 Chest x-ray with hyperinflated lungs and no acute abnormal  Resolved Hospital Problem list     Assessment & Plan:  Severe COPD with exacerbation Continue Zithromax, steroids Change outpatient inhaler to Brovana, Pulmicort and Yupelri nebs DuoNebs as needed Currently he is comfortable on BiPAP with compensated ABG We will continue to monitor  Paroxysmal atrial fibrillation Had a watchman procedure placed in September 2022 Not on anticoagulation.  Continue Plavix, tele monitoring  Chronic diastolic heart failure Lasix as tolerated  Best Practice (right click and "Reselect all SmartList Selections" daily)   Diet/type: NPO DVT prophylaxis: LMWH GI prophylaxis: PPI Lines: N/A Foley:  N/A Code Status:  full code Last date of multidisciplinary goals of care discussion []   Labs   CBC: Recent Labs  Lab 12/27/21 0924 12/27/21 1208 12/28/21 0134  WBC 10.6* 8.6 8.4  NEUTROABS 9.1*  --   --   HGB 13.0 12.1* 11.5*  HCT 42.6 39.8 37.5*  MCV 81.8 81.9 82.1  PLT 242 235 903    Basic Metabolic Panel: Recent Labs  Lab 12/27/21 0924 12/28/21 0134  NA 139 139  K  4.0 3.5  CL 98 104  CO2 32 27  GLUCOSE 123* 108*  BUN 14 18  CREATININE 0.94 0.78  CALCIUM 8.8* 7.4*   GFR: CrCl cannot be calculated (Unknown ideal weight.). Recent Labs  Lab 12/27/21 0924 12/27/21 1208 12/28/21 0134  WBC 10.6* 8.6 8.4    Liver Function Tests: No results for input(s): AST, ALT, ALKPHOS, BILITOT, PROT, ALBUMIN in the last 168 hours. No results for input(s): LIPASE, AMYLASE in the last 168 hours. No results for  input(s): AMMONIA in the last 168 hours.  ABG    Component Value Date/Time   PHART 7.533 (H) 03/19/2019 1434   PCO2ART 37.9 03/19/2019 1434   PO2ART 91.0 03/19/2019 1434   HCO3 38.6 (H) 03/27/2020 0036   TCO2 40 (H) 03/27/2020 0036   O2SAT 100.0 03/27/2020 0036     Coagulation Profile: No results for input(s): INR, PROTIME in the last 168 hours.  Cardiac Enzymes: No results for input(s): CKTOTAL, CKMB, CKMBINDEX, TROPONINI in the last 168 hours.  HbA1C: Hgb A1c MFr Bld  Date/Time Value Ref Range Status  12/27/2021 12:04 PM 6.5 (H) 4.8 - 5.6 % Final    Comment:    (NOTE) Pre diabetes:          5.7%-6.4%  Diabetes:              >6.4%  Glycemic control for   <7.0% adults with diabetes   10/05/2021 08:16 AM 6.1 (H) 4.8 - 5.6 % Final    Comment:             Prediabetes: 5.7 - 6.4          Diabetes: >6.4          Glycemic control for adults with diabetes: <7.0     CBG: Recent Labs  Lab 12/27/21 1844  GLUCAP 155*    Review of Systems:   REVIEW OF SYSTEMS:   All negative; except for those that are bolded, which indicate positives.  Constitutional: weight loss, weight gain, night sweats, fevers, chills, fatigue, weakness.  HEENT: headaches, sore throat, sneezing, nasal congestion, post nasal drip, difficulty swallowing, tooth/dental problems, visual complaints, visual changes, ear aches. Neuro: difficulty with speech, weakness, numbness, ataxia. CV:  chest pain, orthopnea, PND, swelling in lower extremities, dizziness, palpitations, syncope.  Resp: cough, hemoptysis, dyspnea, wheezing. GI: heartburn, indigestion, abdominal pain, nausea, vomiting, diarrhea, constipation, change in bowel habits, loss of appetite, hematemesis, melena, hematochezia.  GU: dysuria, change in color of urine, urgency or frequency, flank pain, hematuria. MSK: joint pain or swelling, decreased range of motion. Psych: change in mood or affect, depression, anxiety, suicidal ideations, homicidal  ideations. Skin: rash, itching, bruising.   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   Surgical History:   Past Surgical History:  Procedure Laterality Date   CATARACT EXTRACTION Right    IR KYPHO EA ADDL LEVEL THORACIC OR LUMBAR  11/28/2021   LEFT ATRIAL APPENDAGE OCCLUSION N/A 08/02/2021   Procedure: LEFT ATRIAL APPENDAGE OCCLUSION;  Surgeon: Sherren Mocha, MD;  Location: [facility];  Service: Cardiovascular;  Laterality: N/A;  SKIN SURGERY     shoulders and chest   TEE WITHOUT CARDIOVERSION N/A 08/02/2021   Procedure: TRANSESOPHAGEAL ECHOCARDIOGRAM (TEE);  Surgeon: Sherren Mocha, MD;  Location: [facility];  Service: Cardiovascular;  Laterality: N/A;   TEE WITHOUT CARDIOVERSION N/A 09/13/2021   Procedure: TRANSESOPHAGEAL ECHOCARDIOGRAM (TEE);  Surgeon: Sanda Klein, MD;  Location: [facility];  Service: Cardiovascular;  Laterality: N/A;     Social History:   reports that he quit smoking about 12 years ago. His smoking use included cigarettes. He has a 104.00 pack-year smoking history. He has never used smokeless tobacco. He reports that he does not currently use alcohol. He reports that he does not use drugs.   Family History:  His family history includes Cancer in his paternal uncle; Heart disease in his brother, father, and mother.   Allergies Allergies  Allergen Reactions   Daliresp [Roflumilast]  Diarrhea and Nausea And Vomiting   Levaquin [Levofloxacin] Palpitations    Made the B/P fluctuate and heart raced   Alendronate Anxiety and Other (See Comments)    Jittery/nervous     Home Medications  Prior to Admission medications   Medication Sig Start Date End Date Taking? Authorizing Provider  acetaminophen (TYLENOL) 325 MG tablet Take 650 mg by mouth every 6 (six) hours as needed for mild pain or fever.   Yes [provider]  albuterol (PROAIR HFA) 108 (90 Base) MCG/ACT inhaler Inhale 2 puffs into the lungs every 6 (six) hours as needed for wheezing or shortness of breath.    Yes [provider]  albuterol (PROVENTIL) (2.5 MG/3ML) 0.083% nebulizer solution Take 3 mLs (2.5 mg total) by nebulization in the morning and at bedtime. 05/09/20  Yes Parrett, Tammy S, NP  Ascorbic Acid (VITAMIN C) 1000 MG tablet Take 1,000 mg by mouth daily.   Yes [provider]  atorvastatin (LIPITOR) 40 MG tablet TAKE 1 TABLET BY MOUTH ONCE DAILY AFTER SUPPER Patient taking differently: Take 40 mg by mouth every evening. 11/29/21  Yes Cox, Kirsten, MD  bisoprolol (ZEBETA) 5 MG tablet Take 1 tablet (5 mg total) by mouth daily. 01/19/21  Yes Jerline Pain, MD  Budeson-Glycopyrrol-Formoterol (BREZTRI AEROSPHERE) 160-9-4.8 MCG/ACT AERO Inhale 2 puffs into the lungs 2 (two) times daily. 03/26/21  Yes Cox, Kirsten, MD  calcium carbonate (OS-CAL) 600 MG TABS tablet Take 600 mg by mouth daily.   Yes [provider]  Cholecalciferol (VITAMIN D) 50 MCG (2000 UT) CAPS Take 2,000 Units by mouth daily.   Yes [provider]  clopidogrel (PLAVIX) 75 MG tablet Take 1 tablet (75 mg total) by mouth daily. 09/13/21 03/12/22 Yes Eileen [place], PA-C  denosumab (PROLIA) 60 MG/ML SOSY injection Inject 60 mg into the skin every 6 (six) months.   Yes [provider]  ferrous sulfate 325 (65 FE) MG tablet Take 325 mg by mouth 2 (two) times daily with a meal.   Yes [provider]  furosemide (LASIX) 40 MG tablet Take 1 tablet (40 mg total) by mouth daily. 01/19/21  Yes Jerline Pain, MD  guaiFENesin (MUCINEX) 600 MG 12 hr tablet Take 600 mg by mouth 2 (two) times daily.   Yes [provider]  loratadine (CLARITIN) 10 MG tablet Take 10 mg by mouth in the morning.   Yes [provider]  Multiple Vitamin (MULTIVITAMIN WITH MINERALS) TABS tablet Take 1 tablet by mouth daily.   Yes [provider]  Niacinamide-Zn-Cu-Methfo-Se-Cr (NICOTINAMIDE PO) Take 1 tablet by  mouth in the morning and at bedtime.   Yes [provider]  OXYGEN Inhale 2-2.5 L/min into the lungs See admin instructions. Inhale 2 L/min of oxygen into the lungs at bedtime and when ambulatory   Yes [provider]  pantoprazole (PROTONIX) 40 MG tablet Take 1 tablet by mouth once daily Patient taking differently: Take 40 mg by mouth daily. 12/12/21  Yes Jerline Pain, MD  predniSONE (DELTASONE) 5 MG tablet Take 1 tablet (5 mg total) by mouth daily with breakfast. 11/27/21  Yes Parrett, Tammy S, NP  sertraline (ZOLOFT) 50 MG tablet Take 1 tablet by mouth once daily Patient taking differently: Take 50 mg by mouth at bedtime. 06/28/21  Yes Cox, Kirsten, MD  Budeson-Glycopyrrol-Formoterol (BREZTRI AEROSPHERE) 160-9-4.8 MCG/ACT AERO Inhale 2 puffs into the lungs in the morning and at bedtime. Patient not taking: Reported on 12/27/2021 11/27/21   Parrett, Fonnie Mu, NP  diltiazem (CARDIZEM) 60 MG tablet Take 1 tablet (60 mg total) by mouth 4 (four) times daily as needed. Patient not taking: Reported on 12/11/2021 01/19/21   Jerline Pain, MD  tiZANidine (ZANAFLEX) 2 MG tablet Take 1 tablet (2 mg total) by mouth every 6 (six) hours as needed for muscle spasms. Patient not taking: Reported on 12/27/2021 11/12/21   [name], MD     Critical care time: NA   [name] MD [facility] See Amion for pager  If no response to pager , please  call [phone] until 7pm After 7:00 pm call Elink  [phone] 12/28/2021, 9:08 AM

## 2021-12-28 NOTE — Progress Notes (Addendum)
PROGRESS NOTE   Paul [name]  XQJ:[id number]    DOB: [birth year]    DOA: 12/27/2021  PCP: [name], MD   I have briefly reviewed patients previous medical records in [facility].  Chief Complaint  Patient presents with   Shortness of Breath    Brief Narrative:  76 year old married male, medical history significant for severe Gold 3 COPD-oxygen and steroid-dependent, chronic respiratory failure on home oxygen (uses 2 L/min [place] at baseline), former smoker quit in 2010, recurrent COPD exacerbations and hospitalizations, paroxysmal A. fib s/p watchman atrial appendage device, type II DM, GERD, HLD, HTN, presented to ED on 12/27/2020 due to 1 day history of progressive dyspnea and productive cough.  Admitted for acute on chronic respiratory failure with hypoxia due to COPD exacerbation.  On morning of 12/28/2020, noted to be in respiratory extremis, BiPAP initiated, transitioned to stepdown, PCCM consulted for assistance.  Able to come off BiPAP after couple of hours and has been on [place] 3 L/min oxygen.   Assessment & Plan:  Principal Problem:   COPD with acute exacerbation ([place]) Active Problems:   Former cigarette smoker   Coronary artery calcification   HLD (hyperlipidemia)   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[place])   Essential hypertension   Age-related osteoporosis with healing pathological fracture, chronic back pain   Chronic diastolic CHF (congestive heart failure) (HCC)   GAD (generalized anxiety disorder)   Chronic respiratory failure with hypoxia (HCC)   Paroxysmal atrial fibrillation (HCC)   GERD (gastroesophageal reflux disease) / GI protection on antiplatelet therapy   COPD exacerbation (HCC)   Acute on chronic respiratory failure with hypoxia due to COPD exacerbation Follows with Dr. Melvyn Novas, [facility] pulmonology. Severe Gold 3 COPD and at baseline on [place] oxygen 2 L/min and prednisone 5 Mg daily. May be precipitated by acute viral versus bacterial RTI. Chest  x-ray: Emphysema without acute findings. Continue empirically started IV azithromycin. Increased IV Solu-Medrol to 60 mg every 8 hours. Continue scheduled DuoNebs, as needed albuterol nebulizer and Breztri changed to arformoterol and budesonide nebs. Aggressive pulmonary toilet.  Added Mucinex and flutter valve. BiPAP initiated in ED.  Changed status to stepdown. PCCM consulted for assistance.  Paroxysmal atrial fibrillation S/p watchman device placed 08/2021. Continue bisoprolol and clopidogrel.  Hyperlipidemia Continue statins.  Chronic diastolic CHF Compensated.  Continue home dose of Lasix. Discontinued IV fluids, no indication seen. 2D echo 9/22: LVEF 60-65%.  Essential hypertension Uncontrolled this morning, likely due to acute respiratory distress. Monitor after respiratory status stabilizes. Continue bisoprolol.  Type II DM with mixed hyperlipidemia A1c 6.5 Monitor CBGs closely, likely to worsen due to steroids. SSI initiated.  GERD Continue PPI  Generalized anxiety disorder Continue Zoloft.  Anemia May be dilutional.  Follow CBC in AM.    There is no height or weight on file to calculate BMI.   DVT prophylaxis: enoxaparin (LOVENOX) injection 40 mg Start: 12/27/21 1800     Code Status: Full Code Family Communication: None at bedside Disposition:  Status is: Inpatient  Remains inpatient appropriate because: Severity of illness including acute respiratory failure, currently on BiPAP, IV meds etc.        Consultants:   [facility] pulmonology.  Procedures:   BiPAP 1/6  Antimicrobials:   IV ceftriaxone 1/5 > IV doxycycline 1/5 x 1   Subjective:  Seen this morning in ED.  Reports no significant improvement in the last 24 hours since onset of symptoms and even on arrival to the hospital.  Chest heaviness, significant difficulty breathing.  Cough with intermittent white sputum but no chest pain.  Wheezing.  Objective:   Vitals:   12/27/21 2250  12/28/21 0336 12/28/21 0500 12/28/21 0725  BP: (!) 143/89 (!) 147/124 (!) 142/58   Pulse: 99 95 100 (!) 123  Resp: 20 20 20  (!) 29  Temp: 98 F (36.7 C)     TempSrc: Oral     SpO2: 99% 97% 98% 100%    General exam: Elderly male, moderately built and frail, sitting up in bed with severe respiratory distress. Respiratory system: Markedly reduced breath sounds bilaterally.  Harsh breath sounds.  Scattered bilateral medium pitched expiratory rhonchi and audible wheezes.  Pursed lip breathing.  Accessory muscle active. Cardiovascular system: S1 & S2 heard, regular tachycardia. No JVD, murmurs, rubs, gallops or clicks. No pedal edema.  Telemetry personally reviewed: Sinus tachycardia in the 120s. Gastrointestinal system: Abdomen is nondistended, soft and nontender. No organomegaly or masses felt. Normal bowel sounds heard. Central nervous system: Alert and oriented. No focal neurological deficits. Extremities: Symmetric 5 x 5 power. Skin: No rashes, lesions or ulcers Psychiatry: Judgement and insight appear normal. Mood & affect slightly anxious.     Data Reviewed:   I have personally reviewed following labs and imaging studies   CBC: Recent Labs  Lab 12/27/21 0924 12/27/21 1208 12/28/21 0134  WBC 10.6* 8.6 8.4  NEUTROABS 9.1*  --   --   HGB 13.0 12.1* 11.5*  HCT 42.6 39.8 37.5*  MCV 81.8 81.9 82.1  PLT 242 235 638    Basic Metabolic Panel: Recent Labs  Lab 12/27/21 0924 12/28/21 0134  NA 139 139  K 4.0 3.5  CL 98 104  CO2 32 27  GLUCOSE 123* 108*  BUN 14 18  CREATININE 0.94 0.78  CALCIUM 8.8* 7.4*    Liver Function Tests: No results for input(s): AST, ALT, ALKPHOS, BILITOT, PROT, ALBUMIN in the last 168 hours.  CBG: Recent Labs  Lab 12/27/21 1844 12/28/21 0829  GLUCAP 155* 99    Microbiology Studies:   Recent Results (from the past 240 hour(s))  Resp Panel by RT-PCR (Flu A&B, Covid) Nasopharyngeal Swab     Status: None   Collection Time: 12/27/21  9:24  AM   Specimen: Nasopharyngeal Swab; Nasopharyngeal(NP) swabs in vial transport medium  Result Value Ref Range Status   SARS Coronavirus 2 by RT PCR NEGATIVE NEGATIVE Final    Comment: (NOTE) SARS-CoV-2 target nucleic acids are NOT DETECTED.  The SARS-CoV-2 RNA is generally detectable in upper respiratory specimens during the acute phase of infection. The lowest concentration of SARS-CoV-2 viral copies this assay can detect is 138 copies/mL. A negative result does not preclude SARS-Cov-2 infection and should not be used as the sole basis for treatment or other patient management decisions. A negative result may occur with  improper specimen collection/handling, submission of specimen other than nasopharyngeal swab, presence of viral mutation(s) within the areas targeted by this assay, and inadequate number of viral copies(<138 copies/mL). A negative result must be combined with clinical observations, patient history, and epidemiological information. The expected result is Negative.  Fact Sheet for Patients:  EntrepreneurPulse.com.au  Fact Sheet for Healthcare Providers:  IncredibleEmployment.be  This test is no t yet approved or cleared by the [place] FDA and  has been authorized for detection and/or diagnosis of SARS-CoV-2 by FDA under an Emergency Use Authorization (EUA). This EUA will remain  in effect (meaning this test can be used) for the duration  of the COVID-19 declaration under Section 564(b)(1) of the Act, 21 U.S.C.section 360bbb-3(b)(1), unless the authorization is terminated  or revoked sooner.       Influenza A by PCR NEGATIVE NEGATIVE Final   Influenza B by PCR NEGATIVE NEGATIVE Final    Comment: (NOTE) The Xpert Xpress SARS-CoV-2/FLU/RSV plus assay is intended as an aid in the diagnosis of influenza from Nasopharyngeal swab specimens and should not be used as a sole basis for treatment. Nasal washings and aspirates are  unacceptable for Xpert Xpress SARS-CoV-2/FLU/RSV testing.  Fact Sheet for Patients: EntrepreneurPulse.com.au  Fact Sheet for Healthcare Providers: IncredibleEmployment.be  This test is not yet approved or cleared by the [place] FDA and has been authorized for detection and/or diagnosis of SARS-CoV-2 by FDA under an Emergency Use Authorization (EUA). This EUA will remain in effect (meaning this test can be used) for the duration of the COVID-19 declaration under Section 564(b)(1) of the Act, 21 U.S.C. section 360bbb-3(b)(1), unless the authorization is terminated or revoked.  Performed at [facility], [place] [address], [place], [place] [address]     Radiology Studies:  DG Chest 2 View  Result Date: 12/28/2021 CLINICAL DATA:  COPD and shortness of breath. EXAM: CHEST - 2 VIEW COMPARISON:  12/27/2021 FINDINGS: Lungs are hyperexpanded. The lungs are clear without focal pneumonia, edema, pneumothorax or pleural effusion. The cardiopericardial silhouette is within normal limits for size. Telemetry leads overlie the chest. IMPRESSION: Emphysema without acute cardiopulmonary findings. Electronically Signed   By: Misty Stanley M.D.   On: 12/28/2021 06:35   DG Chest 2 View  Result Date: 12/27/2021 CLINICAL DATA:  SOB EXAM: CHEST - 2 VIEW COMPARISON:  08/02/2021 FINDINGS: No consolidation. No visible pleural effusions or pneumothorax. Cardiomediastinal silhouette is within normal limits and similar to prior. L1 compression fracture with approximately 50% height loss and kyphoplasty, which is new since 08/02/2021 but similar in appearance to intraoperative fluoroscopic imaging from kyphoplasty on 11/28/2021. Multiple additional compression fractures in thoracic spine appears similar. IMPRESSION: No evidence of acute cardiopulmonary disease. Electronically Signed   By: Margaretha Sheffield M.D.   On: 12/27/2021 09:05    Scheduled Meds:     arformoterol  15 mcg Nebulization BID   vitamin C  1,000 mg Oral Daily   atorvastatin  40 mg Oral Daily   bisoprolol  5 mg Oral Daily   budesonide (PULMICORT) nebulizer solution  0.5 mg Nebulization BID   calcium carbonate  1 tablet Oral Q breakfast   cholecalciferol  2,000 Units Oral Daily   clopidogrel  75 mg Oral Daily   enoxaparin (LOVENOX) injection  40 mg Subcutaneous Q24H   ferrous sulfate  325 mg Oral BID WC   furosemide  40 mg Oral Daily   guaiFENesin  600 mg Oral BID   insulin aspart  0-15 Units Subcutaneous TID WC   loratadine  10 mg Oral q AM   methylPREDNISolone (SOLU-MEDROL) injection  60 mg Intravenous Q8H   multivitamin with minerals  1 tablet Oral Daily   pantoprazole  40 mg Oral Daily   revefenacin  175 mcg Nebulization Daily   sertraline  50 mg Oral Daily    Continuous Infusions:    azithromycin Stopped (12/28/21 0059)   lactated ringers Stopped (12/28/21 0717)     LOS: 1 day     Vernell Leep, MD,  FACP, [facility], [facility], [facility] (Care Management Physician Certified) [place]  To contact the attending provider between 7A-7P or  the covering provider during after hours 7P-7A, please log into the web site [email or web address] and access using universal [place] password for that web site. If you do not have the password, please call the hospital operator.  12/28/2021, 8:57 AM

## 2021-12-28 NOTE — ED Notes (Signed)
Patient requesting medication for back pain and something to help him sleep.  Would like melatonin if possible.  Will page MD and make aware.  States he "had a bowel movement and it has made things worse."  Will continue to monitor

## 2021-12-28 NOTE — ED Notes (Signed)
Pt's work of breathing has decreased.  Pt reports feeling more comfortable w/ Bi PAP in place. No accessory muscle use noted.  Wheezing no longer heard however breath sounds diminished.

## 2021-12-28 NOTE — Progress Notes (Signed)
RT NOTE:  Pt placed on BiPAP per MD order. Pt WOB before BiPAP was 40 times a minute. Pt now on BiPAP states he feels comfortable on it. WOB looks better on the BiPAP machine. RN is aware.

## 2021-12-29 DIAGNOSIS — J441 Chronic obstructive pulmonary disease with (acute) exacerbation: Secondary | ICD-10-CM | POA: Diagnosis not present

## 2021-12-29 LAB — GLUCOSE, CAPILLARY
Glucose-Capillary: 127 mg/dL — ABNORMAL HIGH (ref 70–99)
Glucose-Capillary: 129 mg/dL — ABNORMAL HIGH (ref 70–99)
Glucose-Capillary: 129 mg/dL — ABNORMAL HIGH (ref 70–99)
Glucose-Capillary: 153 mg/dL — ABNORMAL HIGH (ref 70–99)

## 2021-12-29 LAB — RESPIRATORY PANEL BY PCR

## 2021-12-29 LAB — CBC
HCT: 37.2 % — ABNORMAL LOW (ref 39.0–52.0)
Hemoglobin: 11.3 g/dL — ABNORMAL LOW (ref 13.0–17.0)
MCH: 25.1 pg — ABNORMAL LOW (ref 26.0–34.0)
MCHC: 30.4 g/dL (ref 30.0–36.0)
MCV: 82.7 fL (ref 80.0–100.0)
Platelets: 198 10*3/uL (ref 150–400)
RBC: 4.5 MIL/uL (ref 4.22–5.81)
RDW: 16.1 % — ABNORMAL HIGH (ref 11.5–15.5)
WBC: 6.8 10*3/uL (ref 4.0–10.5)
nRBC: 0 % (ref 0.0–0.2)

## 2021-12-29 LAB — BASIC METABOLIC PANEL
Anion gap: 8 (ref 5–15)
BUN: 28 mg/dL — ABNORMAL HIGH (ref 8–23)
CO2: 30 mmol/L (ref 22–32)
Calcium: 8.3 mg/dL — ABNORMAL LOW (ref 8.9–10.3)
Chloride: 100 mmol/L (ref 98–111)
Creatinine, Ser: 0.93 mg/dL (ref 0.61–1.24)
GFR, Estimated: >60 mL/min (ref >60)
Glucose, Bld: 150 mg/dL — ABNORMAL HIGH (ref 70–99)
Potassium: 3.9 mmol/L (ref 3.5–5.1)
Sodium: 138 mmol/L (ref 135–145)

## 2021-12-29 LAB — MRSA NEXT GEN BY PCR, NASAL: MRSA by PCR Next Gen: NOT DETECTED

## 2021-12-29 MED ORDER — ALBUTEROL SULFATE (2.5 MG/3ML) 0.083% IN NEBU
2.5000 mg | INHALATION_SOLUTION | RESPIRATORY_TRACT | Status: DC | PRN
  Administered 2021-12-29 – 2022-01-01 (×3): 2.5 mg via RESPIRATORY_TRACT
  Filled 2021-12-29 (×3): qty 3

## 2021-12-29 MED ORDER — PANTOPRAZOLE SODIUM 40 MG PO TBEC
40.0000 mg | DELAYED_RELEASE_TABLET | Freq: Two times a day (BID) | ORAL | Status: DC
  Administered 2021-12-29 – 2022-01-04 (×12): 40 mg via ORAL
  Filled 2021-12-29 (×12): qty 1

## 2021-12-29 NOTE — Progress Notes (Signed)
PROGRESS NOTE    Paul [name]  KHT:[id number] DOB: 31-Jul-1946 DOA: 12/27/2021 PCP: [name], MD     Brief Narrative:  76 year old married male, medical history significant for severe Gold 3 COPD-oxygen and steroid-dependent, chronic respiratory failure on home oxygen (uses 2 L/min [place] at baseline), former smoker quit in 2010, recurrent COPD exacerbations and hospitalizations, paroxysmal A. fib s/p watchman atrial appendage device, type II DM, GERD, HLD, HTN, presented to ED on 12/27/2020 due to 1 day history of progressive dyspnea and productive cough.  Admitted for acute on chronic respiratory failure with hypoxia due to COPD exacerbation.  On morning of 12/28/2020, noted to be in respiratory extremis, BiPAP initiated, transitioned to stepdown, PCCM consulted for assistance. Viral array showed he tested + for RSV.   New events last 24 hours / Subjective: Breathing is better but still has quite a bit of DOE. Wheezing and coughing less. Receiving 60 mg Solumedrol q 8 hrs. No CP or palpitations. Appetite fair. No swelling.   Assessment & Plan:   Principal Problem:   COPD with acute exacerbation ([place])  2/2 RSV infection  Appreciate Pulm/CCM  Solumedrol 60 mg q 8 hrs  [place] O2 as needed, wean as able to baseline 2 L, currently on 4 L, no longer on NiPPV  Cont Azithromycin 500 m/d, day 3/5  Pulmicort neb bid  Albuterol neb q 4 hrs prn  Brovana neb daily  Guaifenisen  Yupelri 175 mcg daily  Active Problems:   Former cigarette smoker   Coronary artery calcification  Lipitor 40 mg/d  Zebeta 5 mg/d  Plavix 75 mg/d     HLD (hyperlipidemia)  Lipitor    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HCC)  SSI   Essential hypertension  As above    Age-related osteoporosis with healing pathological fracture, chronic back pain  Oxycodone prn    Chronic diastolic CHF (congestive heart failure) (HCC)  Lasix 40 mg/d    GAD (generalized anxiety disorder)  Zoloft 50 mg/d    Paroxysmal  atrial fibrillation (HCC)  Zebeta    GERD (gastroesophageal reflux disease) / GI protection on antiplatelet therapy  Protonix 40 mg bid  DVT prophylaxis: Lovenox Code Status: Full Family Communication: Self and wife at bedside Coming From: home Disposition Plan: home Barriers to Discharge: Medical improvement  Consultants:  Pulmonology/CCM  Procedures:  None  Antimicrobials:  Anti-infectives (From admission, onward)    Start     Dose/Rate Route Frequency Ordered Stop   12/27/21 2200  azithromycin (ZITHROMAX) 500 mg in sodium chloride 0.9 % 250 mL IVPB        500 mg 250 mL/hr over 60 Minutes Intravenous Every 24 hours 12/27/21 1251     12/27/21 1115  doxycycline (VIBRAMYCIN) 100 mg in sodium chloride 0.9 % 250 mL IVPB        100 mg 125 mL/hr over 120 Minutes Intravenous  Once 12/27/21 1105 12/27/21 1423        Objective: Vitals:   12/29/21 0900 12/29/21 1000 12/29/21 1100 12/29/21 1200  BP:    (!) 115/53  Pulse: 96 85 85 73  Resp: 18 (!) 22 (!) 30 (!) 21  Temp:    97.7 F (36.5 C)  TempSrc:    Oral  SpO2: 98% 100% 100% 99%  Weight:      Height:        Intake/Output Summary (Last 24 hours) at 12/29/2021 1236 Last data filed at 12/29/2021 1000 Gross per 24 hour  Intake 270.01 ml  Output 1050 ml  Net -779.99 ml   Filed Weights   12/29/21 0010 12/29/21 0500  Weight: 64.8 kg 64.8 kg    Examination:  General exam: Appears calm and comfortable  Respiratory system: Poor resp exchange, faint exp wheezes throughout. Respiratory effort normal. No respiratory distress. No conversational dyspnea.  Cardiovascular system: S1 & S2 heard, RRR. No murmurs. No pedal edema. Gastrointestinal system: Abdomen is nondistended, soft and nontender. Normal bowel sounds heard. Central nervous system: Alert and oriented. No focal neurological deficits. Speech clear.  Extremities: Symmetric in appearance  Skin: No rashes, lesions or ulcers on exposed skin  Psychiatry: Judgement and  insight appear normal. Mood & affect appropriate.   Data Reviewed: I have personally reviewed following labs and imaging studies  CBC: Recent Labs  Lab 12/27/21 0924 12/27/21 1208 12/28/21 0134 12/29/21 0244  WBC 10.6* 8.6 8.4 6.8  NEUTROABS 9.1*  --   --   --   HGB 13.0 12.1* 11.5* 11.3*  HCT 42.6 39.8 37.5* 37.2*  MCV 81.8 81.9 82.1 82.7  PLT 242 235 229 812   Basic Metabolic Panel: Recent Labs  Lab 12/27/21 0924 12/28/21 0134 12/29/21 0244  NA 139 139 138  K 4.0 3.5 3.9  CL 98 104 100  CO2 32 27 30  GLUCOSE 123* 108* 150*  BUN 14 18 28*  CREATININE 0.94 0.78 0.93  CALCIUM 8.8* 7.4* 8.3*   GFR: Estimated Creatinine Clearance: 61 mL/min (by C-G formula based on SCr of 0.93 mg/dL).  HbA1C: Recent Labs    12/27/21 1204  HGBA1C 6.5*   CBG: Recent Labs  Lab 12/28/21 1150 12/28/21 1815 12/28/21 2239 12/29/21 0827 12/29/21 1218  GLUCAP 106* 193* 132* 127* 129*   Recent Results (from the past 240 hour(s))  Resp Panel by RT-PCR (Flu A&B, Covid) Nasopharyngeal Swab     Status: None   Collection Time: 12/27/21  9:24 AM   Specimen: Nasopharyngeal Swab; Nasopharyngeal(NP) swabs in vial transport medium  Result Value Ref Range Status   SARS Coronavirus 2 by RT PCR NEGATIVE NEGATIVE Final    Comment: (NOTE) SARS-CoV-2 target nucleic acids are NOT DETECTED.  The SARS-CoV-2 RNA is generally detectable in upper respiratory specimens during the acute phase of infection. The lowest concentration of SARS-CoV-2 viral copies this assay can detect is 138 copies/mL. A negative result does not preclude SARS-Cov-2 infection and should not be used as the sole basis for treatment or other patient management decisions. A negative result may occur with  improper specimen collection/handling, submission of specimen other than nasopharyngeal swab, presence of viral mutation(s) within the areas targeted by this assay, and inadequate number of viral copies(<138 copies/mL). A  negative result must be combined with clinical observations, patient history, and epidemiological information. The expected result is Negative.  Fact Sheet for Patients:  EntrepreneurPulse.com.au  Fact Sheet for Healthcare Providers:  IncredibleEmployment.be  This test is no t yet approved or cleared by the [place] FDA and  has been authorized for detection and/or diagnosis of SARS-CoV-2 by FDA under an Emergency Use Authorization (EUA). This EUA will remain  in effect (meaning this test can be used) for the duration of the COVID-19 declaration under Section 564(b)(1) of the Act, 21 U.S.C.section 360bbb-3(b)(1), unless the authorization is terminated  or revoked sooner.       Influenza A by PCR NEGATIVE NEGATIVE Final   Influenza B by PCR NEGATIVE NEGATIVE Final    Comment: (NOTE) The Xpert Xpress SARS-CoV-2/FLU/RSV plus assay is intended as  an aid in the diagnosis of influenza from Nasopharyngeal swab specimens and should not be used as a sole basis for treatment. Nasal washings and aspirates are unacceptable for Xpert Xpress SARS-CoV-2/FLU/RSV testing.  Fact Sheet for Patients: EntrepreneurPulse.com.au  Fact Sheet for Healthcare Providers: IncredibleEmployment.be  This test is not yet approved or cleared by the [place] FDA and has been authorized for detection and/or diagnosis of SARS-CoV-2 by FDA under an Emergency Use Authorization (EUA). This EUA will remain in effect (meaning this test can be used) for the duration of the COVID-19 declaration under Section 564(b)(1) of the Act, 21 U.S.C. section 360bbb-3(b)(1), unless the authorization is terminated or revoked.  Performed at [facility], [place] [address], [place], [place] [address]   Respiratory (~20 pathogens) panel by PCR     Status: Abnormal   Collection Time: 12/28/21  9:57 PM   Specimen: Nasopharyngeal Swab;  Respiratory  Result Value Ref Range Status   Adenovirus NOT DETECTED NOT DETECTED Final   Coronavirus 229E NOT DETECTED NOT DETECTED Final    Comment: (NOTE) The Coronavirus on the Respiratory Panel, DOES NOT test for the novel  Coronavirus (2019 nCoV)    Coronavirus HKU1 NOT DETECTED NOT DETECTED Final   Coronavirus NL63 NOT DETECTED NOT DETECTED Final   Coronavirus OC43 NOT DETECTED NOT DETECTED Final   Metapneumovirus NOT DETECTED NOT DETECTED Final   Rhinovirus / Enterovirus NOT DETECTED NOT DETECTED Final   Influenza A NOT DETECTED NOT DETECTED Final   Influenza B NOT DETECTED NOT DETECTED Final   Parainfluenza Virus 1 NOT DETECTED NOT DETECTED Final   Parainfluenza Virus 2 NOT DETECTED NOT DETECTED Final   Parainfluenza Virus 3 NOT DETECTED NOT DETECTED Final   Parainfluenza Virus 4 NOT DETECTED NOT DETECTED Final   Respiratory Syncytial Virus DETECTED (A) NOT DETECTED Final   Bordetella pertussis NOT DETECTED NOT DETECTED Final   Bordetella Parapertussis NOT DETECTED NOT DETECTED Final   Chlamydophila pneumoniae NOT DETECTED NOT DETECTED Final   Mycoplasma pneumoniae NOT DETECTED NOT DETECTED Final    Comment: Performed at [facility], [place]. [address], [place], [place] [address]  MRSA Next Gen by PCR, Nasal     Status: None   Collection Time: 12/28/21 10:47 PM   Specimen: Nasal Mucosa; Nasal Swab  Result Value Ref Range Status   MRSA by PCR Next Gen NOT DETECTED NOT DETECTED Final    Comment: (NOTE) The GeneXpert MRSA Assay (FDA approved for NASAL specimens only), is one component of a comprehensive MRSA colonization surveillance program. It is not intended to diagnose MRSA infection nor to guide or monitor treatment for MRSA infections. Test performance is not FDA approved in patients less than 47 years old. Performed at [facility], [place] [address], [place], [place] [address]       Radiology Studies: DG Chest 2 View  Result Date:  12/28/2021 CLINICAL DATA:  COPD and shortness of breath. EXAM: CHEST - 2 VIEW COMPARISON:  12/27/2021 FINDINGS: Lungs are hyperexpanded. The lungs are clear without focal pneumonia, edema, pneumothorax or pleural effusion. The cardiopericardial silhouette is within normal limits for size. Telemetry leads overlie the chest. IMPRESSION: Emphysema without acute cardiopulmonary findings. Electronically Signed   By: Misty Stanley M.D.   On: 12/28/2021 06:35     Scheduled Meds:  arformoterol  15 mcg Nebulization BID   vitamin C  1,000 mg Oral Daily   atorvastatin  40 mg Oral Daily   bisoprolol  5 mg  Oral Daily   budesonide (PULMICORT) nebulizer solution  0.5 mg Nebulization BID   calcium carbonate  1 tablet Oral Q breakfast   Chlorhexidine Gluconate Cloth  6 each Topical Q0600   cholecalciferol  2,000 Units Oral Daily   clopidogrel  75 mg Oral Daily   enoxaparin (LOVENOX) injection  40 mg Subcutaneous Q24H   ferrous sulfate  325 mg Oral BID WC   furosemide  40 mg Oral Daily   guaiFENesin  600 mg Oral BID   insulin aspart  0-15 Units Subcutaneous TID WC   loratadine  10 mg Oral q AM   mouth rinse  15 mL Mouth Rinse BID   methylPREDNISolone (SOLU-MEDROL) injection  60 mg Intravenous Q8H   multivitamin with minerals  1 tablet Oral Daily   pantoprazole  40 mg Oral BID AC   revefenacin  175 mcg Nebulization Daily   sertraline  50 mg Oral Daily   Continuous Infusions:  azithromycin Stopped (12/28/21 2347)   lactated ringers 10 mL/hr at 12/29/21 1000     LOS: 2 days    Time spent: 30 minutes   Shelda Pal, DO [facility] 12/29/2021, 12:36 PM   Available via Epic secure chat 7am-7pm After these hours, please refer to coverage provider listed on [email or web address]

## 2021-12-29 NOTE — Progress Notes (Signed)
NAME:  Paul [name], MRN:  [id number], DOB:  11/09/1946, LOS: 2 ADMISSION DATE:  12/27/2021, CONSULTATION DATE:  12/28/2020 REFERRING MD:  Danella Deis MD, CHIEF COMPLAINT:  COPD exacerbation   History of Present Illness:   76 year old with severe COPD (FEV1 31%) on home oxygen, chronic prednisone of 5 mg.  He is a patient of Dr. Melvyn Novas in the pulmonary clinic. Admitted with COPD exacerbation.  Started on BiPAP in the ED and PCCM consulted for help with  Of note he is on home palliative care but remains full code  Pertinent  Medical History    has a past medical history of Anemia, Aortic atherosclerosis ([place]), Atrial fibrillation ([place]), Back pain, COPD (chronic obstructive pulmonary disease) ([place]), COVID-19 (12/24/2020), Diabetes mellitus without complication ([place]), Elevated PSA, GAD (generalized anxiety disorder), GERD (gastroesophageal reflux disease), High cholesterol, Hypertension, Lingular pneumonia (01/05/2021), Lung nodule < 6cm on CT (05/29/2016), Osteoporosis, Oxygen deficiency, Pneumonia, 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   Significant Hospital Events: Including procedures, antibiotic start and stop dates in addition to other pertinent events   1/6-admit 1/6 viral resp panel  POS RSV/ neg covid/flu 1/6 MRSA PCR screen  neg  1/7 d/c bipap   Scheduled Meds:  arformoterol  15 mcg Nebulization BID   vitamin C  1,000 mg Oral Daily   atorvastatin  40 mg Oral Daily   bisoprolol  5 mg Oral Daily   budesonide (PULMICORT) nebulizer solution  0.5 mg Nebulization BID   calcium carbonate  1 tablet Oral Q breakfast   Chlorhexidine Gluconate Cloth  6 each Topical Q0600   cholecalciferol  2,000 Units Oral Daily   clopidogrel  75 mg Oral Daily   enoxaparin (LOVENOX) injection  40 mg Subcutaneous Q24H   ferrous sulfate  325 mg Oral BID WC   furosemide  40 mg Oral Daily   guaiFENesin  600 mg Oral BID    insulin aspart  0-15 Units Subcutaneous TID WC   loratadine  10 mg Oral q AM   mouth rinse  15 mL Mouth Rinse BID   methylPREDNISolone (SOLU-MEDROL) injection  60 mg Intravenous Q8H   multivitamin with minerals  1 tablet Oral Daily   pantoprazole  40 mg Oral Daily   revefenacin  175 mcg Nebulization Daily   sertraline  50 mg Oral Daily   Continuous Infusions:  azithromycin 500 mg (12/28/21 2246)   lactated ringers 10 mL/hr at 12/29/21 0800   PRN Meds:.acetaminophen, ipratropium-albuterol, melatonin, oxyCODONE, tiZANidine    Interim History / Subjective:  Much better last 24 h  - no increased wob since off bipap this am  Objective   Blood pressure (!) 119/57, pulse 73, temperature 97.6 F (36.4 C), temperature source Axillary, resp. rate 18, height 5\' 6"  (1.676 m), weight 64.8 kg, SpO2 98 %.    FiO2 (%):  [50 %] 50 %   Intake/Output Summary (Last 24 hours) at 12/29/2021 0827 Last data filed at 12/29/2021 0455 Gross per 24 hour  Intake --  Output 1050 ml  Net -1050 ml   Filed Weights   12/29/21 0010 12/29/21 0500  Weight: 64.8 kg 64.8 kg    Examination: Tmax  99.7 General appearance:    hoarse wm / rattling cough   At Rest 02 sats   95% on 3lpm  No jvd Oropharynx clear,  mucosa nl Neck supple Lungs with distant exp > insp rhonchi bilaterally RRR no s3 or or sign murmur  Abd soft/ not tender/ says hungry Extr warm with no edema or clubbing noted Neuro  Sensorium intact,  no apparent motor deficits      Resolved Hospital Problem list     Assessment & Plan:  Severe COPD with exacerbation secondary to RSV Continue Zithromax, steroids Continue  Brovana, Pulmicort and Yupelri nebs Albuterol as needed (d/c atrovent while on lama)  >>> ideally watch off bipap today/ tonight and floor if bed needed     Paroxysmal atrial fibrillation Had a watchman procedure placed in September 2022 Not on anticoagulation.  Continue Plavix, tele monitoring  Chronic diastolic heart  failure Well compensated   PCCM service to see prn   Best Practice (right click and "Reselect all SmartList Selections" daily)   Diet/type: advance as tol DVT prophylaxis: LMWH GI prophylaxis: PPI Lines: N/A Foley:  N/A Code Status:  full code    Labs   CBC: Recent Labs  Lab 12/27/21 0924 12/27/21 1208 12/28/21 0134 12/29/21 0244  WBC 10.6* 8.6 8.4 6.8  NEUTROABS 9.1*  --   --   --   HGB 13.0 12.1* 11.5* 11.3*  HCT 42.6 39.8 37.5* 37.2*  MCV 81.8 81.9 82.1 82.7  PLT 242 235 229 673    Basic Metabolic Panel: Recent Labs  Lab 12/27/21 0924 12/28/21 0134 12/29/21 0244  NA 139 139 138  K 4.0 3.5 3.9  CL 98 104 100  CO2 32 27 30  GLUCOSE 123* 108* 150*  BUN 14 18 28*  CREATININE 0.94 0.78 0.93  CALCIUM 8.8* 7.4* 8.3*   GFR: Estimated Creatinine Clearance: 61 mL/min (by C-G formula based on SCr of 0.93 mg/dL). Recent Labs  Lab 12/27/21 0924 12/27/21 1208 12/28/21 0134 12/29/21 0244  WBC 10.6* 8.6 8.4 6.8    Liver Function Tests: No results for input(s): AST, ALT, ALKPHOS, BILITOT, PROT, ALBUMIN in the last 168 hours. No results for input(s): LIPASE, AMYLASE in the last 168 hours. No results for input(s): AMMONIA in the last 168 hours.  ABG    Component Value Date/Time   PHART 7.533 (H) 03/19/2019 1434   PCO2ART 37.9 03/19/2019 1434   PO2ART 91.0 03/19/2019 1434   HCO3 38.6 (H) 03/27/2020 0036   TCO2 40 (H) 03/27/2020 0036   O2SAT 100.0 03/27/2020 0036     Coagulation Profile: No results for input(s): INR, PROTIME in the last 168 hours.  Cardiac Enzymes: No results for input(s): CKTOTAL, CKMB, CKMBINDEX, TROPONINI in the last 168 hours.  HbA1C: Hgb A1c MFr Bld  Date/Time Value Ref Range Status  12/27/2021 12:04 PM 6.5 (H) 4.8 - 5.6 % Final    Comment:    (NOTE) Pre diabetes:          5.7%-6.4%  Diabetes:              >6.4%  Glycemic control for   <7.0% adults with diabetes   10/05/2021 08:16 AM 6.1 (H) 4.8 - 5.6 % Final    Comment:              Prediabetes: 5.7 - 6.4          Diabetes: >6.4          Glycemic control for adults with diabetes: <7.0     CBG: Recent Labs  Lab 12/27/21 1844 12/28/21 0829 12/28/21 1150 12/28/21 1815 12/28/21 2239  GLUCAP 155* 99 106* 193* 132*      Christinia Gully, MD Pulmonary and [place] [phone]   After 7:00  pm call Elink  [phone]

## 2021-12-29 NOTE — Plan of Care (Signed)

## 2021-12-30 DIAGNOSIS — J441 Chronic obstructive pulmonary disease with (acute) exacerbation: Secondary | ICD-10-CM | POA: Diagnosis not present

## 2021-12-30 LAB — CBC
HCT: 35.2 % — ABNORMAL LOW (ref 39.0–52.0)
Hemoglobin: 10.6 g/dL — ABNORMAL LOW (ref 13.0–17.0)
MCH: 25.3 pg — ABNORMAL LOW (ref 26.0–34.0)
MCHC: 30.1 g/dL (ref 30.0–36.0)
MCV: 84 fL (ref 80.0–100.0)
Platelets: 218 10*3/uL (ref 150–400)
RBC: 4.19 MIL/uL — ABNORMAL LOW (ref 4.22–5.81)
RDW: 16.2 % — ABNORMAL HIGH (ref 11.5–15.5)
WBC: 9.6 10*3/uL (ref 4.0–10.5)
nRBC: 0 % (ref 0.0–0.2)

## 2021-12-30 LAB — GLUCOSE, CAPILLARY
Glucose-Capillary: 131 mg/dL — ABNORMAL HIGH (ref 70–99)
Glucose-Capillary: 128 mg/dL — ABNORMAL HIGH (ref 70–99)
Glucose-Capillary: 139 mg/dL — ABNORMAL HIGH (ref 70–99)
Glucose-Capillary: 122 mg/dL — ABNORMAL HIGH (ref 70–99)

## 2021-12-30 LAB — BASIC METABOLIC PANEL
Anion gap: 8 (ref 5–15)
BUN: 32 mg/dL — ABNORMAL HIGH (ref 8–23)
CO2: 30 mmol/L (ref 22–32)
Calcium: 8.2 mg/dL — ABNORMAL LOW (ref 8.9–10.3)
Chloride: 102 mmol/L (ref 98–111)
Creatinine, Ser: 0.89 mg/dL (ref 0.61–1.24)
GFR, Estimated: >60 mL/min (ref >60)
Glucose, Bld: 135 mg/dL — ABNORMAL HIGH (ref 70–99)
Potassium: 3.8 mmol/L (ref 3.5–5.1)
Sodium: 140 mmol/L (ref 135–145)

## 2021-12-30 NOTE — Plan of Care (Signed)

## 2021-12-30 NOTE — Evaluation (Signed)
Physical Therapy Evaluation Patient Details Name: Paul [name] MRN: [id number] DOB: [birth year] Today's Date: 12/30/2021  History of Present Illness  Pt admitted from home 2* SOB and dx with COPD exac and RSV.  Pt with hx of COPD (home O2 as needed primarily at night), a-fib, DM CHF, osteoporosis with T8 compression fx (2020)  Clinical Impression  Pt admitted as above and presenting with functional mobility limitations 2* limited endurance and mild ambulatory balance deficits with O2 desat.  Pt should progress to dc home with family assist.     Recommendations for follow up therapy are one component of a multi-disciplinary discharge planning process, led by the attending physician.  Recommendations may be updated based on patient status, additional functional criteria and insurance authorization.  Follow Up Recommendations No PT follow up    Assistance Recommended at Discharge PRN  Patient can return home with the following       Equipment Recommendations None recommended by PT  Recommendations for Other Services       Functional Status Assessment Patient has had a recent decline in their functional status and demonstrates the ability to make significant improvements in function in a reasonable and predictable amount of time.     Precautions / Restrictions Precautions Precautions: Fall Restrictions Weight Bearing Restrictions: No      Mobility  Bed Mobility               General bed mobility comments: Pt up in recliner and requests back to same    Transfers Overall transfer level: Needs assistance Equipment used: 1 person hand held assist Transfers: Sit to/from Stand Sit to Stand: Min guard           General transfer comment: Steady assist only    Ambulation/Gait Ambulation/Gait assistance: Min guard Gait Distance (Feet): 220 Feet Assistive device: 1 person hand held assist Gait Pattern/deviations: Step-through pattern;Shuffle       General Gait  Details: Mild instability increased with one episode desat to 83% on 4L  Stairs            Wheelchair Mobility    Modified Rankin (Stroke Patients Only)       Balance Overall balance assessment: Needs assistance Sitting-balance support: No upper extremity supported;Feet supported Sitting balance-Leahy Scale: Good     Standing balance support: No upper extremity supported Standing balance-Leahy Scale: Fair                               Pertinent Vitals/Pain Pain Assessment: No/denies pain    Home Living Family/patient expects to be discharged to:: Private residence Living Arrangements: Spouse/significant other Available Help at Discharge: Family;Available 24 hours/day Type of Home: House Home Access: Level entry       Home Layout: One level Home Equipment: Rollator (4 wheels);Shower seat;Grab bars - tub/shower      Prior Function Prior Level of Function : Independent/Modified Independent             Mobility Comments: Pt reports going to the gym prior to onset of current symptoms       Hand Dominance   Dominant Hand: Right    Extremity/Trunk Assessment   Upper Extremity Assessment Upper Extremity Assessment: Overall WFL for tasks assessed    Lower Extremity Assessment Lower Extremity Assessment: Overall WFL for tasks assessed       Communication   Communication: No difficulties  Cognition Arousal/Alertness: Awake/alert Behavior During Therapy: WFL for tasks  assessed/performed Overall Cognitive Status: Within Functional Limits for tasks assessed                                          General Comments      Exercises     Assessment/Plan    PT Assessment Patient needs continued PT services  PT Problem List Decreased activity tolerance;Decreased balance;Decreased mobility       PT Treatment Interventions DME instruction;Gait training;Stair training;Functional mobility training;Therapeutic  activities;Therapeutic exercise;Patient/family education;Balance training    PT Goals (Current goals can be found in the Care Plan section)  Acute Rehab PT Goals Patient Stated Goal: Regain IND and endurance PT Goal Formulation: With patient Time For Goal Achievement: 01/13/22 Potential to Achieve Goals: Good    Frequency Min 3X/week     Co-evaluation               AM-PAC PT "6 Clicks" Mobility  Outcome Measure Help needed turning from your back to your side while in a flat bed without using bedrails?: None Help needed moving from lying on your back to sitting on the side of a flat bed without using bedrails?: None Help needed moving to and from a bed to a chair (including a wheelchair)?: None Help needed standing up from a chair using your arms (e.g., wheelchair or bedside chair)?: A Little Help needed to walk in hospital room?: A Little Help needed climbing 3-5 steps with a railing? : A Little 6 Click Score: 21    End of Session Equipment Utilized During Treatment: Gait belt;Oxygen Activity Tolerance: Patient tolerated treatment well;Patient limited by fatigue Patient left: in chair;with call bell/phone within reach Nurse Communication: Mobility status PT Visit Diagnosis: Unsteadiness on feet (R26.81);Difficulty in walking, not elsewhere classified (R26.2)    Time: 0211-1735 PT Time Calculation (min) (ACUTE ONLY): 20 min   Charges:   PT Evaluation $PT Eval Low Complexity: 1 Low          [facility] Pager [phone] Office [phone]   Edwyn Inclan 12/30/2021, 5:12 PM

## 2021-12-30 NOTE — Progress Notes (Signed)
PROGRESS NOTE    Paul [name]  HRC:[id number] DOB: 03/31/1946 DOA: 12/27/2021 PCP: [name], MD     Brief Narrative:  76 year old married male, medical history significant for severe Gold 3 COPD-oxygen and steroid-dependent, chronic respiratory failure on home oxygen (uses 2 L/min [place] at baseline), former smoker quit in 2010, recurrent COPD exacerbations and hospitalizations, paroxysmal A. fib s/p watchman atrial appendage device, type II DM, GERD, HLD, HTN, presented to ED on 12/27/2020 due to 1 day history of progressive dyspnea and productive cough.  Admitted for acute on chronic respiratory failure with hypoxia due to COPD exacerbation.  On morning of 12/28/2020, noted to be in respiratory extremis, BiPAP initiated, transitioned to stepdown, PCCM consulted for assistance. Viral array showed he tested + for RSV.    New events last 24 hours / Subjective: Pt down to 2 L [place], his baseline. O2 drops when he walks, which is chronic. He usually turns up to 3 L when he walks and does fine. Braething overall is improved but he still has dyspnea. Some coughing, though that is also improving.   Assessment & Plan:   Principal Problem:   COPD with acute exacerbation ([place])             2/2 RSV infection             Appreciate Pulm/CCM             Solumedrol 60 mg q 8 hrs             [place] O2 as needed, weaned baseline 2 L, no longer on NiPPV             Cont Azithromycin 500 mg/d, day 4/5             Pulmicort neb bid             Albuterol neb q 4 hrs prn             Brovana neb daily             Guaifenisen             Yupelri 175 mcg daily  Will have PT see him, hopefully home in the next 1-2 days  Transfer to general floor today   Active Problems:   Former cigarette smoker    Coronary artery calcification             Lipitor 40 mg/d             Zebeta 5 mg/d             Plavix 75 mg/d                HLD (hyperlipidemia)             Lipitor     DM type 2 with diabetic mixed hyperlipidemia  (HCC)             SSI; sugars have been within acceptable parameters    Essential hypertension             As above     Age-related osteoporosis with healing pathological fracture, chronic back pain             Oxycodone prn     Chronic diastolic CHF (congestive heart failure) (HCC)             Lasix 40 mg/d     GAD (generalized anxiety disorder)  Zoloft 50 mg/d     Paroxysmal atrial fibrillation (HCC)             Zebeta     GERD (gastroesophageal reflux disease) / GI protection on antiplatelet therapy             Protonix 40 mg bid   DVT prophylaxis: Lovenox Code Status: Full Family Communication: Self and wife at bedside Coming From: home Disposition Plan: home Barriers to Discharge: Medical improvement  Consultants:  CCM  Procedures:  None  Antimicrobials:  Anti-infectives (From admission, onward)    Start     Dose/Rate Route Frequency Ordered Stop   12/27/21 2200  azithromycin (ZITHROMAX) 500 mg in sodium chloride 0.9 % 250 mL IVPB        500 mg 250 mL/hr over 60 Minutes Intravenous Every 24 hours 12/27/21 1251     12/27/21 1115  doxycycline (VIBRAMYCIN) 100 mg in sodium chloride 0.9 % 250 mL IVPB        100 mg 125 mL/hr over 120 Minutes Intravenous  Once 12/27/21 1105 12/27/21 1423        Objective: Vitals:   12/30/21 0730 12/30/21 0800 12/30/21 0837 12/30/21 0900  BP:  (!) 130/50    Pulse: 75 77  90  Resp: 18 (!) 25  (!) 24  Temp: (!) 96.5 F (35.8 C)     TempSrc: Axillary     SpO2: 100% 99% 98% 95%  Weight:      Height:        Intake/Output Summary (Last 24 hours) at 12/30/2021 1015 Last data filed at 12/30/2021 0900 Gross per 24 hour  Intake 468.17 ml  Output 1500 ml  Net -1031.83 ml   Filed Weights   12/29/21 0010 12/29/21 0500  Weight: 64.8 kg 64.8 kg    Examination:  General exam: Appears calm and comfortable  Respiratory system: Clear to auscultation. Respiratory effort normal. No respiratory distress. No conversational  dyspnea. No longer having wheezing.  Cardiovascular system: S1 & S2 heard, RRR. No murmurs. No pedal edema. Gastrointestinal system: Abdomen is nondistended, soft and nontender. Normal bowel sounds heard. Central nervous system: Alert and oriented. No focal neurological deficits. Speech clear.  Extremities: Symmetric in appearance  Skin: No rashes, lesions or ulcers on exposed skin  Psychiatry: Judgement and insight appear normal. Mood & affect appropriate.   Data Reviewed: I have personally reviewed following labs and imaging studies  CBC: Recent Labs  Lab 12/27/21 0924 12/27/21 1208 12/28/21 0134 12/29/21 0244 12/30/21 0235  WBC 10.6* 8.6 8.4 6.8 9.6  NEUTROABS 9.1*  --   --   --   --   HGB 13.0 12.1* 11.5* 11.3* 10.6*  HCT 42.6 39.8 37.5* 37.2* 35.2*  MCV 81.8 81.9 82.1 82.7 84.0  PLT 242 235 229 198 979   Basic Metabolic Panel: Recent Labs  Lab 12/27/21 0924 12/28/21 0134 12/29/21 0244 12/30/21 0235  NA 139 139 138 140  K 4.0 3.5 3.9 3.8  CL 98 104 100 102  CO2 32 27 30 30   GLUCOSE 123* 108* 150* 135*  BUN 14 18 28* 32*  CREATININE 0.94 0.78 0.93 0.89  CALCIUM 8.8* 7.4* 8.3* 8.2*   HbA1C: Recent Labs    12/27/21 1204  HGBA1C 6.5*   CBG: Recent Labs  Lab 12/29/21 0827 12/29/21 1218 12/29/21 1655 12/29/21 2121 12/30/21 0718  GLUCAP 127* 129* 129* 153* 131*   Recent Results (from the past 240 hour(s))  Resp Panel by RT-PCR (Flu A&B, Covid)  Nasopharyngeal Swab     Status: None   Collection Time: 12/27/21  9:24 AM   Specimen: Nasopharyngeal Swab; Nasopharyngeal(NP) swabs in vial transport medium  Result Value Ref Range Status   SARS Coronavirus 2 by RT PCR NEGATIVE NEGATIVE Final    Comment: (NOTE) SARS-CoV-2 target nucleic acids are NOT DETECTED.  The SARS-CoV-2 RNA is generally detectable in upper respiratory specimens during the acute phase of infection. The lowest concentration of SARS-CoV-2 viral copies this assay can detect is 138 copies/mL. A  negative result does not preclude SARS-Cov-2 infection and should not be used as the sole basis for treatment or other patient management decisions. A negative result may occur with  improper specimen collection/handling, submission of specimen other than nasopharyngeal swab, presence of viral mutation(s) within the areas targeted by this assay, and inadequate number of viral copies(<138 copies/mL). A negative result must be combined with clinical observations, patient history, and epidemiological information. The expected result is Negative.  Fact Sheet for Patients:  EntrepreneurPulse.com.au  Fact Sheet for Healthcare Providers:  IncredibleEmployment.be  This test is no t yet approved or cleared by the [place] FDA and  has been authorized for detection and/or diagnosis of SARS-CoV-2 by FDA under an Emergency Use Authorization (EUA). This EUA will remain  in effect (meaning this test can be used) for the duration of the COVID-19 declaration under Section 564(b)(1) of the Act, 21 U.S.C.section 360bbb-3(b)(1), unless the authorization is terminated  or revoked sooner.       Influenza A by PCR NEGATIVE NEGATIVE Final   Influenza B by PCR NEGATIVE NEGATIVE Final    Comment: (NOTE) The Xpert Xpress SARS-CoV-2/FLU/RSV plus assay is intended as an aid in the diagnosis of influenza from Nasopharyngeal swab specimens and should not be used as a sole basis for treatment. Nasal washings and aspirates are unacceptable for Xpert Xpress SARS-CoV-2/FLU/RSV testing.  Fact Sheet for Patients: EntrepreneurPulse.com.au  Fact Sheet for Healthcare Providers: IncredibleEmployment.be  This test is not yet approved or cleared by the [place] FDA and has been authorized for detection and/or diagnosis of SARS-CoV-2 by FDA under an Emergency Use Authorization (EUA). This EUA will remain in effect (meaning this test can  be used) for the duration of the COVID-19 declaration under Section 564(b)(1) of the Act, 21 U.S.C. section 360bbb-3(b)(1), unless the authorization is terminated or revoked.  Performed at [facility], [place] [address] [place], [place] [address]   Respiratory (~20 pathogens) panel by PCR     Status: Abnormal   Collection Time: 12/28/21  9:57 PM   Specimen: Nasopharyngeal Swab; Respiratory  Result Value Ref Range Status   Adenovirus NOT DETECTED NOT DETECTED Final   Coronavirus 229E NOT DETECTED NOT DETECTED Final    Comment: (NOTE) The Coronavirus on the Respiratory Panel, DOES NOT test for the novel  Coronavirus (2019 nCoV)    Coronavirus HKU1 NOT DETECTED NOT DETECTED Final   Coronavirus NL63 NOT DETECTED NOT DETECTED Final   Coronavirus OC43 NOT DETECTED NOT DETECTED Final   Metapneumovirus NOT DETECTED NOT DETECTED Final   Rhinovirus / Enterovirus NOT DETECTED NOT DETECTED Final   Influenza A NOT DETECTED NOT DETECTED Final   Influenza B NOT DETECTED NOT DETECTED Final   Parainfluenza Virus 1 NOT DETECTED NOT DETECTED Final   Parainfluenza Virus 2 NOT DETECTED NOT DETECTED Final   Parainfluenza Virus 3 NOT DETECTED NOT DETECTED Final   Parainfluenza Virus 4 NOT DETECTED NOT DETECTED Final   Respiratory Syncytial Virus DETECTED (  A) NOT DETECTED Final   Bordetella pertussis NOT DETECTED NOT DETECTED Final   Bordetella Parapertussis NOT DETECTED NOT DETECTED Final   Chlamydophila pneumoniae NOT DETECTED NOT DETECTED Final   Mycoplasma pneumoniae NOT DETECTED NOT DETECTED Final    Comment: Performed at [facility], [place] [address], [place], [place] [address]  MRSA Next Gen by PCR, Nasal     Status: None   Collection Time: 12/28/21 10:47 PM   Specimen: Nasal Mucosa; Nasal Swab  Result Value Ref Range Status   MRSA by PCR Next Gen NOT DETECTED NOT DETECTED Final    Comment: (NOTE) The GeneXpert MRSA Assay (FDA approved for NASAL specimens only), is one  component of a comprehensive MRSA colonization surveillance program. It is not intended to diagnose MRSA infection nor to guide or monitor treatment for MRSA infections. Test performance is not FDA approved in patients less than 69 years old. Performed at [facility], [place] [address], [place], [place] [address]     Scheduled Meds:  arformoterol  15 mcg Nebulization BID   vitamin C  1,000 mg Oral Daily   atorvastatin  40 mg Oral Daily   bisoprolol  5 mg Oral Daily   budesonide (PULMICORT) nebulizer solution  0.5 mg Nebulization BID   calcium carbonate  1 tablet Oral Q breakfast   Chlorhexidine Gluconate Cloth  6 each Topical Q0600   cholecalciferol  2,000 Units Oral Daily   clopidogrel  75 mg Oral Daily   enoxaparin (LOVENOX) injection  40 mg Subcutaneous Q24H   ferrous sulfate  325 mg Oral BID WC   furosemide  40 mg Oral Daily   guaiFENesin  600 mg Oral BID   insulin aspart  0-15 Units Subcutaneous TID WC   loratadine  10 mg Oral q AM   mouth rinse  15 mL Mouth Rinse BID   methylPREDNISolone (SOLU-MEDROL) injection  60 mg Intravenous Q8H   multivitamin with minerals  1 tablet Oral Daily   pantoprazole  40 mg Oral BID AC   revefenacin  175 mcg Nebulization Daily   sertraline  50 mg Oral Daily   Continuous Infusions:  azithromycin Stopped (12/29/21 2159)   lactated ringers 10 mL/hr at 12/30/21 0900     LOS: 3 days    Time spent: 30 minutes   Shelda Pal, DO [facility] 12/30/2021, 10:15 AM   Available via Epic secure chat 7am-7pm After these hours, please refer to coverage provider listed on [email or web address]

## 2021-12-31 ENCOUNTER — Other Ambulatory Visit: Payer: Self-pay

## 2021-12-31 ENCOUNTER — Telehealth: Payer: Self-pay | Admitting: Internal Medicine

## 2021-12-31 ENCOUNTER — Encounter: Payer: Self-pay | Admitting: Family Medicine

## 2021-12-31 DIAGNOSIS — M8000XD Age-related osteoporosis with current pathological fracture, unspecified site, subsequent encounter for fracture with routine healing: Secondary | ICD-10-CM | POA: Diagnosis not present

## 2021-12-31 DIAGNOSIS — J9611 Chronic respiratory failure with hypoxia: Secondary | ICD-10-CM | POA: Diagnosis not present

## 2021-12-31 DIAGNOSIS — J441 Chronic obstructive pulmonary disease with (acute) exacerbation: Secondary | ICD-10-CM | POA: Diagnosis not present

## 2021-12-31 DIAGNOSIS — I5032 Chronic diastolic (congestive) heart failure: Secondary | ICD-10-CM

## 2021-12-31 LAB — CBC
HCT: 36.3 % — ABNORMAL LOW (ref 39.0–52.0)
Hemoglobin: 11 g/dL — ABNORMAL LOW (ref 13.0–17.0)
MCH: 25.4 pg — ABNORMAL LOW (ref 26.0–34.0)
MCHC: 30.3 g/dL (ref 30.0–36.0)
MCV: 83.8 fL (ref 80.0–100.0)
Platelets: 244 10*3/uL (ref 150–400)
RBC: 4.33 MIL/uL (ref 4.22–5.81)
RDW: 16.1 % — ABNORMAL HIGH (ref 11.5–15.5)
WBC: 9.8 10*3/uL (ref 4.0–10.5)
nRBC: 0 % (ref 0.0–0.2)

## 2021-12-31 LAB — BASIC METABOLIC PANEL
Anion gap: 7 (ref 5–15)
BUN: 32 mg/dL — ABNORMAL HIGH (ref 8–23)
CO2: 33 mmol/L — ABNORMAL HIGH (ref 22–32)
Calcium: 8.3 mg/dL — ABNORMAL LOW (ref 8.9–10.3)
Chloride: 104 mmol/L (ref 98–111)
Creatinine, Ser: 0.82 mg/dL (ref 0.61–1.24)
GFR, Estimated: >60 mL/min (ref >60)
Glucose, Bld: 141 mg/dL — ABNORMAL HIGH (ref 70–99)
Potassium: 4 mmol/L (ref 3.5–5.1)
Sodium: 144 mmol/L (ref 135–145)

## 2021-12-31 LAB — GLUCOSE, CAPILLARY
Glucose-Capillary: 115 mg/dL — ABNORMAL HIGH (ref 70–99)
Glucose-Capillary: 149 mg/dL — ABNORMAL HIGH (ref 70–99)
Glucose-Capillary: 136 mg/dL — ABNORMAL HIGH (ref 70–99)
Glucose-Capillary: 108 mg/dL — ABNORMAL HIGH (ref 70–99)

## 2021-12-31 MED ORDER — SERTRALINE HCL 50 MG PO TABS: 50.0000 mg | ORAL_TABLET | Freq: Every day | ORAL | 1 refills | Status: DC

## 2021-12-31 NOTE — Progress Notes (Addendum)
° °  We continue to follow this pt in the hospital to assist with any d/c needs and support the family.  The pt does take routine prednisone daily at home alternating doses of 10mg  and 5mg  each day. He has equipment in his home already that consist of: Tub seat with back, Portable O2 concentrator "simply go" [employer]--O2 concentrator with Home fill device, back up O2 tank, and Nebulizer.    Please contact us for any questions and upon d/c for [place] to see pt in home.  Webb Silversmith RN [phone]

## 2021-12-31 NOTE — Progress Notes (Signed)
Saw patient regarding possible discharge today and his concern that he is not ready to go home.  He was resting comfortably on 3L, but becomes dyspneic with conversation.  He uses 2L home O2, will try decreasing [place] to this and see how he does.  He does not feel at his baseline and is worried he may need to return to the hospital if he goes home too soon.  Agree that he should spend the night tonight, PCCM can check on him tomorrow and see how he is feeling regarding discharge.     Otilio Carpen Tylisha Danis, PA-C [facility] See Amion for pager If no response to pager , please call [phone] until 7pm After 7:00 pm call [place]  [phone]

## 2021-12-31 NOTE — Telephone Encounter (Signed)
Call returned to patient wife, confirmed patient DOB. Pt wife states patient is still currently in the ICU and they are trying to send him home. Wife states patient is not ready and she does not think he is ready to go home. She is wanting to know MW recommendations. She states they have been down this road before and they often send him home too early and he ends up right back in the emergency room.   MW please advise.

## 2021-12-31 NOTE — Telephone Encounter (Signed)
Patient's wife, [name], called requesting refill for Sertraline 50 mg be sent to [facility].  Last filled 06/28/21 #90/1.

## 2021-12-31 NOTE — Telephone Encounter (Signed)
Trenton Gammon Gleason PA to go by to help with d/c planning

## 2021-12-31 NOTE — Progress Notes (Signed)
Patient resting comfortably on 3 Lpm [place] at this time.Vitals stable. No distress noted.

## 2021-12-31 NOTE — Progress Notes (Signed)
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 fib s/p watchman atrial appendage device, type II DM, GERD, HLD, HTN, presented to the hospital on 12/27/2020 for progressive dyspnea cough and shortness of breath.  Patient was in respiratory distress and was initially started on BiPAP.  Subsequently was transferred to stepdown unit.  Viral panel showed RSV.  Patient is improving but is gradual.   Assessment & Plan:  COPD with acute exacerbation Likely secondary to RSV infection. Steroid and oxygen dependant.  Patient has been seen by pulmonary during hospitalization.  On Solu-Medrol Q 60 mg in Zithromax day 5 x 5.  Continue Pulmicort nebulizer albuterol Candiss Norse.      Feels dyspneic on ambulation and is not at his baseline.    T-max of 98.1 F.  Currently on 3 L of oxygen by nasal cannula.Follows up with Dr. Melvyn Novas, pulmonary as outpatient. Might need prednisone taper. Has been having recurrent flare of copd exacerbations. Family very skeptical about coming home early too early.    Former cigarette smoker Encouraged to remain abstinent.    Coronary artery calcification    On Lipitor, Zebeta and Plavix as outpatient.    Hyperlipidemia Continue Lipitor     DM type 2 with diabetic mixed hyperlipidemia  Continue sliding scale insulin, Actos, diabetic diet    Essential hypertension Blood pressure stable at this time.  Continue bisoprolol     Age-related osteoporosis with healing pathological fracture, chronic back pain       Continue oxycodone prn     Chronic diastolic congestive heart failure Continue diuretics with Lasix.      Generalized anxiety disorder     Controlled.  Continue Zoloft daily           Paroxysmal atrial fibrillation        Continue Zebeta     Gastroesophageal reflux disease continue  Protonix 40 mg bid   DVT prophylaxis: Lovenox  Code Status: Full code  Family Communication:  I spoke with the wife on the phone and updated her about the clinical condition of the patient and potential disposition in am if clinically improved. Chance of recurrent admission.   Coming From:  Home  Disposition Plan:  Home with home health likely in 1 to 2 days.  Barriers to Discharge: Medical improvement  Consultants:  Pulmonary  Procedures:  None  Antimicrobials:  Azithromycin 1/5>  Anti-infectives (From admission, onward)    Start     Dose/Rate Route Frequency Ordered Stop   12/27/21 2200  azithromycin (ZITHROMAX) 500 mg in sodium chloride 0.9 % 250 mL IVPB        500 mg 250 mL/hr over 60 Minutes Intravenous Every 24 hours 12/27/21 1251     12/27/21 1115  doxycycline (VIBRAMYCIN) 100 mg in sodium chloride 0.9 % 250 mL IVPB        100 mg 125 mL/hr over 120 Minutes Intravenous  Once 12/27/21 1105 12/27/21 1423       Subjective: Today, patient was seen and examined at bedside.  Patient states that he still has some cough wheezing and is on 3 L of oxygen.  States that he gets  short winded especially on exertion and his oxygen goes down.  Has not fully up to his baseline.   Objective: Vitals:   12/31/21 0328 12/31/21 0400 12/31/21 0500 12/31/21 0600  BP:  131/62    Pulse: 72 72 71 61  Resp: 17 18 20 19   Temp:  97.7 F (36.5 C)    TempSrc:  Oral    SpO2: 99% 99% 100% 100%  Weight:   65.1 kg   Height:        Intake/Output Summary (Last 24 hours) at 12/31/2021 0812 Last data filed at 12/31/2021 0600 Gross per 24 hour  Intake 622.46 ml  Output 1150 ml  Net -527.54 ml    Filed Weights   12/29/21 0010 12/29/21 0500 12/31/21 0500  Weight: 64.8 kg 64.8 kg 65.1 kg    Examination:   General:  Average built, not in obvious distress, on nasal cannula oxygen HENT:   No scleral pallor or icterus noted. Oral mucosa is moist.  Chest:   Diminished breath sounds bilaterally.  Bilateral rhonchi and wheezing noted. CVS: S1 &S2 heard. No murmur.  Regular rate and rhythm. Abdomen: Soft, nontender, nondistended.  Bowel sounds are heard.   Extremities: No cyanosis, clubbing or edema.  Peripheral pulses are palpable. Psych: Alert, awake and oriented, normal mood CNS:  No cranial nerve deficits.  Power equal in all extremities.   Skin: Warm and dry.  No rashes noted.   Data Reviewed: I have personally reviewed following labs and imaging studies  CBC: Recent Labs  Lab 12/27/21 0924 12/27/21 1208 12/28/21 0134 12/29/21 0244 12/30/21 0235 12/31/21 0247  WBC 10.6* 8.6 8.4 6.8 9.6 9.8  NEUTROABS 9.1*  --   --   --   --   --   HGB 13.0 12.1* 11.5* 11.3* 10.6* 11.0*  HCT 42.6 39.8 37.5* 37.2* 35.2* 36.3*  MCV 81.8 81.9 82.1 82.7 84.0 83.8  PLT 242 235 229 198 218 939    Basic Metabolic Panel: Recent Labs  Lab 12/27/21 0924 12/28/21 0134 12/29/21 0244 12/30/21 0235 12/31/21 0247  NA 139 139 138 140 144  K 4.0 3.5 3.9 3.8 4.0  CL 98 104 100 102 104  CO2 32 27 30 30  33*  GLUCOSE 123* 108* 150* 135* 141*  BUN 14 18 28* 32* 32*  CREATININE 0.94 0.78 0.93 0.89 0.82  CALCIUM 8.8* 7.4* 8.3* 8.2* 8.3*    HbA1C: No results for input(s): HGBA1C in the last 72 hours.  CBG: Recent Labs  Lab 12/29/21 2121 12/30/21 0718 12/30/21 1216 12/30/21 1603 12/30/21 2202  GLUCAP 153* 131* 128* 139* 122*    Recent Results (from the past 240 hour(s))  Resp Panel by RT-PCR (Flu A&B, Covid) Nasopharyngeal Swab     Status: None   Collection Time: 12/27/21  9:24 AM   Specimen: Nasopharyngeal Swab; Nasopharyngeal(NP) swabs in vial transport medium  Result Value Ref Range Status   SARS Coronavirus 2 by RT PCR NEGATIVE NEGATIVE Final    Comment: (NOTE) SARS-CoV-2 target nucleic  acids are NOT DETECTED.  The SARS-CoV-2 RNA is generally detectable in upper respiratory specimens during the acute phase of infection. The lowest concentration of SARS-CoV-2 viral copies this assay can detect is 138 copies/mL. A negative result does not preclude SARS-Cov-2 infection and should not be used as the sole basis for treatment or other patient management decisions. A negative result may occur with  improper specimen collection/handling, submission of specimen other than nasopharyngeal swab, presence of viral mutation(s) within the  areas targeted by this assay, and inadequate number of viral copies(<138 copies/mL). A negative result must be combined with clinical observations, patient history, and epidemiological information. The expected result is Negative.  Fact Sheet for Patients:  EntrepreneurPulse.com.au  Fact Sheet for Healthcare Providers:  IncredibleEmployment.be  This test is no t yet approved or cleared by the [place] FDA and  has been authorized for detection and/or diagnosis of SARS-CoV-2 by FDA under an Emergency Use Authorization (EUA). This EUA will remain  in effect (meaning this test can be used) for the duration of the COVID-19 declaration under Section 564(b)(1) of the Act, 21 U.S.C.section 360bbb-3(b)(1), unless the authorization is terminated  or revoked sooner.       Influenza A by PCR NEGATIVE NEGATIVE Final   Influenza B by PCR NEGATIVE NEGATIVE Final    Comment: (NOTE) The Xpert Xpress SARS-CoV-2/FLU/RSV plus assay is intended as an aid in the diagnosis of influenza from Nasopharyngeal swab specimens and should not be used as a sole basis for treatment. Nasal washings and aspirates are unacceptable for Xpert Xpress SARS-CoV-2/FLU/RSV testing.  Fact Sheet for Patients: EntrepreneurPulse.com.au  Fact Sheet for Healthcare Providers: IncredibleEmployment.be  This  test is not yet approved or cleared by the [place] FDA and has been authorized for detection and/or diagnosis of SARS-CoV-2 by FDA under an Emergency Use Authorization (EUA). This EUA will remain in effect (meaning this test can be used) for the duration of the COVID-19 declaration under Section 564(b)(1) of the Act, 21 U.S.C. section 360bbb-3(b)(1), unless the authorization is terminated or revoked.  Performed at [facility], [place] [address], [place], [place] [address]   Respiratory (~20 pathogens) panel by PCR     Status: Abnormal   Collection Time: 12/28/21  9:57 PM   Specimen: Nasopharyngeal Swab; Respiratory  Result Value Ref Range Status   Adenovirus NOT DETECTED NOT DETECTED Final   Coronavirus 229E NOT DETECTED NOT DETECTED Final    Comment: (NOTE) The Coronavirus on the Respiratory Panel, DOES NOT test for the novel  Coronavirus (2019 nCoV)    Coronavirus HKU1 NOT DETECTED NOT DETECTED Final   Coronavirus NL63 NOT DETECTED NOT DETECTED Final   Coronavirus OC43 NOT DETECTED NOT DETECTED Final   Metapneumovirus NOT DETECTED NOT DETECTED Final   Rhinovirus / Enterovirus NOT DETECTED NOT DETECTED Final   Influenza A NOT DETECTED NOT DETECTED Final   Influenza B NOT DETECTED NOT DETECTED Final   Parainfluenza Virus 1 NOT DETECTED NOT DETECTED Final   Parainfluenza Virus 2 NOT DETECTED NOT DETECTED Final   Parainfluenza Virus 3 NOT DETECTED NOT DETECTED Final   Parainfluenza Virus 4 NOT DETECTED NOT DETECTED Final   Respiratory Syncytial Virus DETECTED (A) NOT DETECTED Final   Bordetella pertussis NOT DETECTED NOT DETECTED Final   Bordetella Parapertussis NOT DETECTED NOT DETECTED Final   Chlamydophila pneumoniae NOT DETECTED NOT DETECTED Final   Mycoplasma pneumoniae NOT DETECTED NOT DETECTED Final    Comment: Performed at [facility], [place]. [address], [place], [place] [address]  MRSA Next Gen by PCR, Nasal     Status: None   Collection Time:  12/28/21 10:47 PM   Specimen: Nasal Mucosa; Nasal Swab  Result Value Ref Range Status   MRSA by PCR Next Gen NOT DETECTED NOT DETECTED Final    Comment: (NOTE) The GeneXpert MRSA Assay (FDA approved for NASAL specimens only), is one component of a comprehensive MRSA colonization surveillance program. It is not intended to diagnose MRSA  infection nor to guide or monitor treatment for MRSA infections. Test performance is not FDA approved in patients less than 77 years old. Performed at [facility], [place] [address] [place], [place] [address]      Scheduled Meds:  arformoterol  15 mcg Nebulization BID   vitamin C  1,000 mg Oral Daily   atorvastatin  40 mg Oral Daily   bisoprolol  5 mg Oral Daily   budesonide (PULMICORT) nebulizer solution  0.5 mg Nebulization BID   calcium carbonate  1 tablet Oral Q breakfast   Chlorhexidine Gluconate Cloth  6 each Topical Q0600   cholecalciferol  2,000 Units Oral Daily   clopidogrel  75 mg Oral Daily   enoxaparin (LOVENOX) injection  40 mg Subcutaneous Q24H   ferrous sulfate  325 mg Oral BID WC   furosemide  40 mg Oral Daily   guaiFENesin  600 mg Oral BID   insulin aspart  0-15 Units Subcutaneous TID WC   loratadine  10 mg Oral q AM   mouth rinse  15 mL Mouth Rinse BID   methylPREDNISolone (SOLU-MEDROL) injection  60 mg Intravenous Q8H   multivitamin with minerals  1 tablet Oral Daily   pantoprazole  40 mg Oral BID AC   revefenacin  175 mcg Nebulization Daily   sertraline  50 mg Oral Daily   Continuous Infusions:  azithromycin Stopped (12/30/21 2254)   lactated ringers 10 mL/hr at 12/31/21 0600     LOS: 4 days    Flora Lipps, MD [facility] 12/31/2021, 8:12 AM   Available via Epic secure chat 7am-7pm After these hours, please refer to coverage provider listed on [email or web address]

## 2021-12-31 NOTE — Telephone Encounter (Signed)
Call returned to patient wife, made aware MW recommendations. Voiced understanding.   Nothing further needed at this time.

## 2022-01-01 DIAGNOSIS — M8000XD Age-related osteoporosis with current pathological fracture, unspecified site, subsequent encounter for fracture with routine healing: Secondary | ICD-10-CM | POA: Diagnosis not present

## 2022-01-01 DIAGNOSIS — J9611 Chronic respiratory failure with hypoxia: Secondary | ICD-10-CM | POA: Diagnosis not present

## 2022-01-01 DIAGNOSIS — B338 Other specified viral diseases: Secondary | ICD-10-CM

## 2022-01-01 DIAGNOSIS — I5032 Chronic diastolic (congestive) heart failure: Secondary | ICD-10-CM | POA: Diagnosis not present

## 2022-01-01 DIAGNOSIS — J441 Chronic obstructive pulmonary disease with (acute) exacerbation: Secondary | ICD-10-CM | POA: Diagnosis not present

## 2022-01-01 LAB — BASIC METABOLIC PANEL
Anion gap: 5 (ref 5–15)
BUN: 35 mg/dL — ABNORMAL HIGH (ref 8–23)
CO2: 33 mmol/L — ABNORMAL HIGH (ref 22–32)
Calcium: 8 mg/dL — ABNORMAL LOW (ref 8.9–10.3)
Chloride: 104 mmol/L (ref 98–111)
Creatinine, Ser: 0.98 mg/dL (ref 0.61–1.24)
GFR, Estimated: >60 mL/min (ref >60)
Glucose, Bld: 139 mg/dL — ABNORMAL HIGH (ref 70–99)
Potassium: 3.9 mmol/L (ref 3.5–5.1)
Sodium: 142 mmol/L (ref 135–145)

## 2022-01-01 LAB — CBC
HCT: 38.4 % — ABNORMAL LOW (ref 39.0–52.0)
Hemoglobin: 11.5 g/dL — ABNORMAL LOW (ref 13.0–17.0)
MCH: 24.9 pg — ABNORMAL LOW (ref 26.0–34.0)
MCHC: 29.9 g/dL — ABNORMAL LOW (ref 30.0–36.0)
MCV: 83.1 fL (ref 80.0–100.0)
Platelets: 261 10*3/uL (ref 150–400)
RBC: 4.62 MIL/uL (ref 4.22–5.81)
RDW: 16.1 % — ABNORMAL HIGH (ref 11.5–15.5)
WBC: 9.8 10*3/uL (ref 4.0–10.5)
nRBC: 0 % (ref 0.0–0.2)

## 2022-01-01 LAB — GLUCOSE, CAPILLARY
Glucose-Capillary: 108 mg/dL — ABNORMAL HIGH (ref 70–99)
Glucose-Capillary: 153 mg/dL — ABNORMAL HIGH (ref 70–99)
Glucose-Capillary: 121 mg/dL — ABNORMAL HIGH (ref 70–99)
Glucose-Capillary: 133 mg/dL — ABNORMAL HIGH (ref 70–99)

## 2022-01-01 MED ORDER — MONTELUKAST SODIUM 10 MG PO TABS
10.0000 mg | ORAL_TABLET | Freq: Every day | ORAL | Status: DC
  Administered 2022-01-01 – 2022-01-03 (×3): 10 mg via ORAL
  Filled 2022-01-01 (×3): qty 1

## 2022-01-01 MED ORDER — LORAZEPAM 0.5 MG PO TABS
0.5000 mg | ORAL_TABLET | Freq: Four times a day (QID) | ORAL | Status: DC | PRN
  Administered 2022-01-01 (×2): 0.5 mg via ORAL
  Filled 2022-01-01 (×2): qty 1

## 2022-01-01 NOTE — Progress Notes (Signed)
PT Cancellation Note  Patient Details Name: Paul [name] MRN: [id number] DOB: [birth year]   Cancelled Treatment:    Reason Eval/Treat Not Completed: Other (comment) Attempted to see this morning however RN in room and stated not a good time (pt with decreased saturations and anxiety).  Pt reports feeling better upon checking back this afternoon however had just been up to use urinal and became easily winded, so politely declined at this time.    Myrtis Hopping [place] 01/01/2022, 2:29 PM Arlyce Dice, DPT Acute Rehabilitation Services Pager: [phone] Office: [phone]

## 2022-01-01 NOTE — Care Management Important Message (Signed)
Important Message  Patient Details IM Letter placed in Patients room. Name: Paul [name] MRN: [id number] Date of Birth: September 24, 1946   Medicare Important Message Given:  Yes     Kerin Salen 01/01/2022, 11:13 AM

## 2022-01-01 NOTE — TOC Initial Note (Signed)
Transition of Care [facility]) - Initial/Assessment Note    Patient Details  Name: Paul [name] MRN: [id number] Date of Birth: Sep 04, 1946  [facility] CM/SW Contact:    Dessa Phi, RN Phone Number: 01/01/2022, 10:21 AM  Clinical Narrative: From home w/spouse-monitor on 02. Has own transport home.                  Expected Discharge Plan: Home/Self Care Barriers to Discharge: No Barriers Identified   Patient Goals and CMS Choice Patient states their goals for this hospitalization and ongoing recovery are:: go home CMS Medicare.gov Compare Post Acute Care list provided to:: Patient Represenative (must comment) ([name] spouse [phone]) Choice offered to / list presented to : Spouse  Expected Discharge Plan and Services Expected Discharge Plan: Home/Self Care   Discharge Planning Services: CM Consult Post Acute Care Choice:  (home) Living arrangements for the past 2 months: Single Family Home                                      Prior Living Arrangements/Services Living arrangements for the past 2 months: Single Family Home Lives with:: Spouse   Do you feel safe going back to the place where you live?: Yes      Need for Family Participation in Patient Care: No (Comment) Care giver support system in place?: Yes (comment) Current home services: DME, Other (comment) (neb machine;[facility] 02 @ night;[facility]) Criminal Activity/Legal Involvement Pertinent to Current Situation/Hospitalization: No - Comment as needed  Activities of Daily Living Home Assistive Devices/Equipment: None ADL Screening (condition at time of admission) Patient's cognitive ability adequate to safely complete daily activities?: Yes Is the patient deaf or have difficulty hearing?: No Does the patient have difficulty seeing, even when wearing glasses/contacts?: No Does the patient have difficulty concentrating, remembering, or making decisions?:  No Patient able to express need for assistance with ADLs?: Yes Does the patient have difficulty dressing or bathing?: No Independently performs ADLs?: Yes (appropriate for developmental age) Does the patient have difficulty walking or climbing stairs?: No Weakness of Legs: Both Weakness of Arms/Hands: Both  Permission Sought/Granted Permission sought to share information with : Case Manager Permission granted to share information with : Yes, Verbal Permission Granted  Share Information with NAME: Case manager           Emotional Assessment Appearance:: Appears stated age Attitude/Demeanor/Rapport: Gracious Affect (typically observed): Accepting Orientation: : Oriented to Self, Oriented to Place, Oriented to  Time, Oriented to Situation Alcohol / Substance Use: Not Applicable Psych Involvement: No (comment)  Admission diagnosis:  COPD (chronic obstructive pulmonary disease) (HCC) [J44.9] COPD exacerbation (HCC) [J44.1] Chronic obstructive pulmonary disease with acute exacerbation (HCC) [J44.1] Patient Active Problem List   Diagnosis Date Noted   COPD with acute exacerbation ([place]) 12/27/2021   GERD (gastroesophageal reflux disease) / GI protection on antiplatelet therapy 12/27/2021   COPD exacerbation ([place]) 12/27/2021   Lumbar back pain 11/17/2021   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/22/2021   Oxygen dependent 10/09/2021   Paroxysmal atrial fibrillation ([place]) 08/02/2021   Chronic respiratory failure with hypoxia ([place]) 01/16/2021   GAD (generalized anxiety disorder) 01/14/2021   Chronic diastolic CHF (congestive heart failure) ([place]) 04/01/2020   Age-related osteoporosis with healing pathological fracture, chronic back pain 02/06/2020   Essential hypertension 01/28/2020   Chronic bilateral thoracic back pain 01/25/2020   DM type 2 with diabetic  mixed hyperlipidemia ([place]) 01/25/2020   HLD (hyperlipidemia) 01/05/2020   Chronic anemia 05/29/2019   Pulmonary nodules 05/29/2019    Coronary artery calcification 04/06/2019   Atherosclerosis of aorta ([place]) 04/06/2019   Former cigarette smoker 12/05/2015   COPD GOLD  III     PCP:  [name], MD Pharmacy:   [facility] [address], [place] [address] Phone: [phone] Fax: [phone]  [place], [place] [address] [place] [address] [place] [address] Phone: [phone] Fax: [phone]  [id number] [place], [place] [address] [place] [address] Phone: [phone] Fax: [phone]     Social Determinants of Health (SDOH) Interventions    Readmission Risk Interventions Readmission Risk Prevention Plan 01/01/2022 01/15/2021 04/05/2020  Transportation Screening Complete Complete Complete  PCP or Specialist Appt within 3-5 Days Complete - -  Not Complete comments - - -  [place] or Home Care Consult Complete - -  Social Work Consult for [place] Planning/Counseling Complete - -  Palliative Care Screening Complete - -  Medication Review Press photographer) Complete Complete Complete  PCP or Specialist appointment within 3-5 days of discharge - Complete Complete  [place] or [place] - Complete Complete  SW Recovery Care/Counseling Consult - Complete Complete  Palliative Care Screening - Not Applicable Complete  [place] - Not Applicable Not Applicable  Some recent data might be hidden

## 2022-01-01 NOTE — Progress Notes (Addendum)
° °  This pt is active with [facility] a home based Palliative care program provided by [employer].   I have noted that pt continues to need increasing amt of oxygen at times up to 6 liters.  He has oxygen in home provided by [facility]. However, It is that only a 5 liters concentrator so this would need to be switch out for a 10L concentrator if he is d/c home and continues to need that amount of oxygen.    I have reached out to [facility] CM to update as well.  Webb Silversmith RN [phone]

## 2022-01-01 NOTE — Progress Notes (Signed)
Pt with continued labored breathing, long coughing fits, feels SOB. O2 Sats dropped to 78% 3L/[place], O2 increased to 5L/[place]. Took several minutes for O2 Sats to increase above 90. PRN Ativan given. Pt currently relaxing more, fan turned on patient and cold washcloth applied to forehead. O2 Sats 95% 3L/[place]. Will continue to monitor closely.

## 2022-01-01 NOTE — Progress Notes (Signed)
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 fib s/p watchman atrial appendage device, type II DM, GERD, HLD, HTN, presented to the hospital on 12/27/2020 for progressive dyspnea cough and shortness of breath.  Patient was in respiratory distress and was initially started on BiPAP.  Subsequently was transferred to stepdown unit.  Viral panel showed RSV.  Patient is improving but is gradual.   Assessment & Plan:  COPD with acute exacerbation Likely secondary to RSV infection.  Patient is steroid and oxygen dependant at baseline.  Patient has been seen by pulmonary during hospitalization.  On Solu-Medrol Q 60 mg q8h. Patient has completed a 5-day course of Zithromax.    Continue Pulmicort nebulizer, albuterol, Garlon Hatchet, Yupelri.      Feels dyspneic on ambulation and is not at his baseline.  He comes in the office wheezing.    T-max of 98.1 F.  Currently on 3 L of oxygen by nasal cannula. Follows up with Dr. Melvyn Novas, pulmonary as outpatient. Might need prednisone taper. Has been having recurrent flare of copd exacerbations. Family very skeptical about coming home early too early.  Communicated with the pulmonary team.    Former cigarette smoker Encouraged to remain abstinent.    Coronary artery calcification    On Lipitor, Zebeta and Plavix as outpatient.    Hyperlipidemia Continue Lipitor     DM type 2 with diabetic mixed hyperlipidemia  Continue sliding scale insulin,  diabetic diet    Essential hypertension Blood pressure stable at this time.  Continue bisoprolol     Age-related osteoporosis with healing pathological  fracture, chronic back pain       Continue oxycodone prn     Chronic diastolic congestive heart failure Continue diuretics with Lasix.     Generalized anxiety disorder     Controlled.  Continue Zoloft daily           Paroxysmal atrial fibrillation        Continue Zebeta     Gastroesophageal reflux disease continue  Protonix 40 mg bid   DVT prophylaxis: Lovenox subcu  Code Status: Full code  Family Communication:  I spoke with the wife on the phone and updated her about the clinical condition of the patient on 12/31/2021   Disposition Plan:  Home with home health likely in 1 to 2 days.  Barriers to Discharge: Medical improvement  Consultants:  Pulmonary  Procedures:  None  Antimicrobials:  Azithromycin 1/5>01/01/22  Anti-infectives (From admission, onward)    Start     Dose/Rate Route Frequency Ordered Stop   12/27/21 2200  azithromycin (ZITHROMAX) 500 mg in sodium chloride 0.9 % 250 mL IVPB  Status:  Discontinued        500 mg 250 mL/hr over 60 Minutes Intravenous Every 24 hours 12/27/21 1251 01/01/22 0806   12/27/21 1115  doxycycline (VIBRAMYCIN) 100 mg in sodium chloride 0.9 % 250 mL IVPB        100 mg 125 mL/hr over 120 Minutes Intravenous  Once 12/27/21 1105 12/27/21 1423       Subjective: Today, patient was seen and examined at bedside.  Still complains of wheezing  cough and shortness of breath.   Objective: Vitals:   01/01/22 0810 01/01/22 1000 01/01/22 1006 01/01/22 1147  BP:    122/62  Pulse:    79  Resp:    20  Temp:    98 F (36.7 C)  TempSrc:    Oral  SpO2: 99% 93% 98% 99%  Weight:      Height:        Intake/Output Summary (Last 24 hours) at 01/01/2022 1328 Last data filed at 01/01/2022 1310 Gross per 24 hour  Intake 764.13 ml  Output 1150 ml  Net -385.87 ml    Filed Weights   12/29/21 0010 12/29/21 0500 12/31/21 0500  Weight: 64.8 kg 64.8 kg 65.1 kg    Examination:  General:  Average built, not in obvious distress, on nasal  cannula oxygen HENT:   No scleral pallor or icterus noted. Oral mucosa is moist.  Chest:  Diminished breath sounds bilaterally.  Wheezing noted. CVS: S1 &S2 heard. No murmur.  Regular rate and rhythm. Abdomen: Soft, nontender, nondistended.  Bowel sounds are heard.   Extremities: No cyanosis, clubbing or edema.  Peripheral pulses are palpable. Psych: Alert, awake and oriented, normal mood CNS:  No cranial nerve deficits.  Power equal in all extremities.   Skin: Warm and dry.  No rashes noted.  Data Reviewed: I have personally reviewed following labs and imaging studies  CBC: Recent Labs  Lab 12/27/21 0924 12/27/21 1208 12/28/21 0134 12/29/21 0244 12/30/21 0235 12/31/21 0247 01/01/22 0422  WBC 10.6*   < > 8.4 6.8 9.6 9.8 9.8  NEUTROABS 9.1*  --   --   --   --   --   --   HGB 13.0   < > 11.5* 11.3* 10.6* 11.0* 11.5*  HCT 42.6   < > 37.5* 37.2* 35.2* 36.3* 38.4*  MCV 81.8   < > 82.1 82.7 84.0 83.8 83.1  PLT 242   < > 229 198 218 244 261   < > = values in this interval not displayed.    Basic Metabolic Panel: Recent Labs  Lab 12/28/21 0134 12/29/21 0244 12/30/21 0235 12/31/21 0247 01/01/22 0422  NA 139 138 140 144 142  K 3.5 3.9 3.8 4.0 3.9  CL 104 100 102 104 104  CO2 27 30 30  33* 33*  GLUCOSE 108* 150* 135* 141* 139*  BUN 18 28* 32* 32* 35*  CREATININE 0.78 0.93 0.89 0.82 0.98  CALCIUM 7.4* 8.3* 8.2* 8.3* 8.0*    HbA1C: No results for input(s): HGBA1C in the last 72 hours.  CBG: Recent Labs  Lab 12/31/21 1153 12/31/21 1615 12/31/21 2115 01/01/22 0754 01/01/22 1145  GLUCAP 149* 136* 108* 108* 153*    Recent Results (from the past 240 hour(s))  Resp Panel by RT-PCR (Flu A&B, Covid) Nasopharyngeal Swab     Status: None   Collection Time: 12/27/21  9:24 AM   Specimen: Nasopharyngeal Swab; Nasopharyngeal(NP) swabs in vial transport medium  Result Value Ref Range Status   SARS Coronavirus 2 by RT PCR NEGATIVE NEGATIVE Final    Comment: (NOTE) SARS-CoV-2  target nucleic acids are NOT DETECTED.  The SARS-CoV-2 RNA is generally detectable in upper respiratory specimens during the acute phase of infection. The lowest concentration of SARS-CoV-2 viral copies this assay can detect is 138 copies/mL. A negative result does not preclude SARS-Cov-2 infection and should not be used as the sole basis for treatment or other patient management decisions. A negative result may occur with  improper specimen collection/handling, submission of specimen other than nasopharyngeal swab, presence of viral mutation(s) within the areas targeted by this assay, and inadequate number of viral copies(<138 copies/mL). A negative result must be combined with clinical observations, patient history, and epidemiological information. The expected result is Negative.  Fact Sheet for Patients:  EntrepreneurPulse.com.au  Fact Sheet for Healthcare Providers:  IncredibleEmployment.be  This test is no t yet approved or cleared by the [place] FDA and  has been authorized for detection and/or diagnosis of SARS-CoV-2 by FDA under an Emergency Use Authorization (EUA). This EUA will remain  in effect (meaning this test can be used) for the duration of the COVID-19 declaration under Section 564(b)(1) of the Act, 21 U.S.C.section 360bbb-3(b)(1), unless the authorization is terminated  or revoked sooner.       Influenza A by PCR NEGATIVE NEGATIVE Final   Influenza B by PCR NEGATIVE NEGATIVE Final    Comment: (NOTE) The Xpert Xpress SARS-CoV-2/FLU/RSV plus assay is intended as an aid in the diagnosis of influenza from Nasopharyngeal swab specimens and should not be used as a sole basis for treatment. Nasal washings and aspirates are unacceptable for Xpert Xpress SARS-CoV-2/FLU/RSV testing.  Fact Sheet for Patients: EntrepreneurPulse.com.au  Fact Sheet for Healthcare  Providers: IncredibleEmployment.be  This test is not yet approved or cleared by the [place] FDA and has been authorized for detection and/or diagnosis of SARS-CoV-2 by FDA under an Emergency Use Authorization (EUA). This EUA will remain in effect (meaning this test can be used) for the duration of the COVID-19 declaration under Section 564(b)(1) of the Act, 21 U.S.C. section 360bbb-3(b)(1), unless the authorization is terminated or revoked.  Performed at [facility], [place] [address], [place], [place] [address]   Respiratory (~20 pathogens) panel by PCR     Status: Abnormal   Collection Time: 12/28/21  9:57 PM   Specimen: Nasopharyngeal Swab; Respiratory  Result Value Ref Range Status   Adenovirus NOT DETECTED NOT DETECTED Final   Coronavirus 229E NOT DETECTED NOT DETECTED Final    Comment: (NOTE) The Coronavirus on the Respiratory Panel, DOES NOT test for the novel  Coronavirus (2019 nCoV)    Coronavirus HKU1 NOT DETECTED NOT DETECTED Final   Coronavirus NL63 NOT DETECTED NOT DETECTED Final   Coronavirus OC43 NOT DETECTED NOT DETECTED Final   Metapneumovirus NOT DETECTED NOT DETECTED Final   Rhinovirus / Enterovirus NOT DETECTED NOT DETECTED Final   Influenza A NOT DETECTED NOT DETECTED Final   Influenza B NOT DETECTED NOT DETECTED Final   Parainfluenza Virus 1 NOT DETECTED NOT DETECTED Final   Parainfluenza Virus 2 NOT DETECTED NOT DETECTED Final   Parainfluenza Virus 3 NOT DETECTED NOT DETECTED Final   Parainfluenza Virus 4 NOT DETECTED NOT DETECTED Final   Respiratory Syncytial Virus DETECTED (A) NOT DETECTED Final   Bordetella pertussis NOT DETECTED NOT DETECTED Final   Bordetella Parapertussis NOT DETECTED NOT DETECTED Final   Chlamydophila pneumoniae NOT DETECTED NOT DETECTED Final   Mycoplasma pneumoniae NOT DETECTED NOT DETECTED Final    Comment: Performed at [facility], [place]. [address], [place], [place] [address]   MRSA Next Gen by PCR, Nasal     Status: None   Collection Time: 12/28/21 10:47 PM   Specimen: Nasal Mucosa; Nasal Swab  Result Value Ref Range Status   MRSA by PCR Next Gen NOT DETECTED NOT DETECTED Final    Comment: (NOTE) The GeneXpert MRSA Assay (FDA approved for NASAL specimens only), is  one component of a comprehensive MRSA colonization surveillance program. It is not intended to diagnose MRSA infection nor to guide or monitor treatment for MRSA infections. Test performance is not FDA approved in patients less than 88 years old. Performed at [facility], [place] [address], [place], [place] [address]      Scheduled Meds:  arformoterol  15 mcg Nebulization BID   vitamin C  1,000 mg Oral Daily   atorvastatin  40 mg Oral Daily   bisoprolol  5 mg Oral Daily   budesonide (PULMICORT) nebulizer solution  0.5 mg Nebulization BID   calcium carbonate  1 tablet Oral Q breakfast   Chlorhexidine Gluconate Cloth  6 each Topical Q0600   cholecalciferol  2,000 Units Oral Daily   clopidogrel  75 mg Oral Daily   enoxaparin (LOVENOX) injection  40 mg Subcutaneous Q24H   ferrous sulfate  325 mg Oral BID WC   furosemide  40 mg Oral Daily   guaiFENesin  600 mg Oral BID   insulin aspart  0-15 Units Subcutaneous TID WC   loratadine  10 mg Oral q AM   mouth rinse  15 mL Mouth Rinse BID   methylPREDNISolone (SOLU-MEDROL) injection  60 mg Intravenous Q8H   montelukast  10 mg Oral QHS   multivitamin with minerals  1 tablet Oral Daily   pantoprazole  40 mg Oral BID AC   revefenacin  175 mcg Nebulization Daily   sertraline  50 mg Oral Daily   Continuous Infusions:  lactated ringers 10 mL/hr at 12/31/21 2121     LOS: 5 days    Flora Lipps, MD [facility] 01/01/2022, 1:28 PM

## 2022-01-01 NOTE — Progress Notes (Signed)
NAME:  Paul [name], MRN:  [id number], DOB:  1946-10-23, LOS: 5 ADMISSION DATE:  12/27/2021, CONSULTATION DATE: 1/62023 REFERRING MD:  Dr. Algis Liming, [facility]IE[facility] COMPLAINT:  Short of breath   History of Present Illness:  76 yo male former smoker presented to [facility] ER with dyspnea, productive cough and increasing oxygen needs.  Associated with subjective fever.  Admitted for COPD exacerbation and tested positive for RSV.  PCCM consulted to assist with respiratory management.  He is followed by Dr. Melvyn Novas in the pulmonary office.  He is enrolled with palliative care services through [facility].  Pertinent  Medical History  Anemia, A fib s/p Watchman procedure, COVID 10 January 2021, DM type 2, Anxiety, GERD, HLD, HTN, Osteoporosis, COPD with chronic respiratory failure on 2 liters with exertion and sleep  Studies:  PFT 05/29/16 >> FEV1 0.83 (31%), FEV1% 40, TLC 6.56 (107%), DLCO 60% A1AT 06/15/19 >> 178, MM Echo 07/09/21 >> EF 60 to 65%, grade 1 DD Cardiac CT 07/18/21 >> moderate centrilobular emphysema, stable 4 mm nodule RUL  Interim History / Subjective:  He is feeling more short of breath.  Still coughing and has persistent wheezing.  He is having sinus congestion and had nose bleed this morning.  Didn't use Bipap last night, but he felt like it help when he used a couple nights ago.  Objective   Blood pressure 122/62, pulse 79, temperature 98 F (36.7 C), temperature source Oral, resp. rate 20, height 5\' 6"  (1.676 m), weight 65.1 kg, SpO2 99 %.        Intake/Output Summary (Last 24 hours) at 01/01/2022 1229 Last data filed at 01/01/2022 9024 Gross per 24 hour  Intake 964.13 ml  Output 1100 ml  Net -135.87 ml   Filed Weights   12/29/21 0010 12/29/21 0500 12/31/21 0500  Weight: 64.8 kg 64.8 kg 65.1 kg    Examination:  General - alert, has to pause to catch his breath between speaking sentences Eyes - pupils reactive ENT - no sinus tenderness, no stridor Cardiac - regular  rate/rhythm, no murmur Chest - diffuse b/l wheezing Abdomen - soft, non tender, + bowel sounds Extremities - missing parts of his fingers, no edema Skin - no rashes Neuro - normal strength, moves extremities, follows commands Psych - anxious  Assessment & Plan:   Acute on chronic hypoxic respiratory failure in setting of COPD exacerbation from RSV respiratory infection. - he is slow to recover from exacerbation and still has significant wheezing with dyspnea - continue solumedrol 60 mg q8h; he is steroid dependent and use prednisone 10 mg qod and 5 mg qod - continue brovana, yupelri, pulmicort - prn albuterol - will add singulair - continue bronchial hygiene - goal SpO2 90 to 95% - Bipap prn  CAD, HTN, HLD, DM type 2, Osteoporosis, Chronic diastolic CHF, Anxiety, PAF, GERD. - per primary team  Labs    CMP Latest Ref Rng & Units 01/01/2022 12/31/2021 12/30/2021  Glucose 70 - 99 mg/dL 139(H) 141(H) 135(H)  BUN 8 - 23 mg/dL 35(H) 32(H) 32(H)  Creatinine 0.61 - 1.24 mg/dL 0.98 0.82 0.89  Sodium 135 - 145 mmol/L 142 144 140  Potassium 3.5 - 5.1 mmol/L 3.9 4.0 3.8  Chloride 98 - 111 mmol/L 104 104 102  CO2 22 - 32 mmol/L 33(H) 33(H) 30  Calcium 8.9 - 10.3 mg/dL 8.0(L) 8.3(L) 8.2(L)  Total Protein 6.0 - 8.5 g/dL - - -  Total Bilirubin 0.0 - 1.2 mg/dL - - -  Alkaline  Phos 44 - 121 IU/L - - -  AST 0 - 40 IU/L - - -  ALT 0 - 44 IU/L - - -   CBC Latest Ref Rng & Units 01/01/2022 12/31/2021 12/30/2021  WBC 4.0 - 10.5 K/uL 9.8 9.8 9.6  Hemoglobin 13.0 - 17.0 g/dL 11.5(L) 11.0(L) 10.6(L)  Hematocrit 39.0 - 52.0 % 38.4(L) 36.3(L) 35.2(L)  Platelets 150 - 400 K/uL 261 244 218    ABG    Component Value Date/Time   PHART 7.533 (H) 03/19/2019 1434   PCO2ART 37.9 03/19/2019 1434   PO2ART 91.0 03/19/2019 1434   HCO3 38.6 (H) 03/27/2020 0036   TCO2 40 (H) 03/27/2020 0036   O2SAT 100.0 03/27/2020 0036    CBG (last 3)  Recent Labs    12/31/21 2115 01/01/22 0754 01/01/22 1145  GLUCAP  108* 108* 153*   Signature:  Chesley Mires, MD [place] Pager - [phone] 01/01/2022, 1:14 PM

## 2022-01-01 NOTE — Progress Notes (Signed)
Patient called RN c/o SOB and "not getting enough air." O2 Sats 93% 3L/[place]. Pt flushed, lying in bed, labored breathing, c/o chest feeling "tight." RR 22. Lung sounds diminished. O2 increased to 6L temporarily for comfort, RT notified for PRN neb tx. Pt states he feels anxious, does not feel ready to go home.Per pt, he takes PRN Ativan 0.5 mg at home. Will make MD aware.

## 2022-01-02 DIAGNOSIS — J441 Chronic obstructive pulmonary disease with (acute) exacerbation: Secondary | ICD-10-CM | POA: Diagnosis not present

## 2022-01-02 DIAGNOSIS — I5032 Chronic diastolic (congestive) heart failure: Secondary | ICD-10-CM | POA: Diagnosis not present

## 2022-01-02 DIAGNOSIS — M8000XD Age-related osteoporosis with current pathological fracture, unspecified site, subsequent encounter for fracture with routine healing: Secondary | ICD-10-CM | POA: Diagnosis not present

## 2022-01-02 DIAGNOSIS — J9611 Chronic respiratory failure with hypoxia: Secondary | ICD-10-CM | POA: Diagnosis not present

## 2022-01-02 LAB — CBC
HCT: 38.2 % — ABNORMAL LOW (ref 39.0–52.0)
Hemoglobin: 11.7 g/dL — ABNORMAL LOW (ref 13.0–17.0)
MCH: 25.5 pg — ABNORMAL LOW (ref 26.0–34.0)
MCHC: 30.6 g/dL (ref 30.0–36.0)
MCV: 83.2 fL (ref 80.0–100.0)
Platelets: 262 10*3/uL (ref 150–400)
RBC: 4.59 MIL/uL (ref 4.22–5.81)
RDW: 15.9 % — ABNORMAL HIGH (ref 11.5–15.5)
WBC: 8.2 10*3/uL (ref 4.0–10.5)
nRBC: 0 % (ref 0.0–0.2)

## 2022-01-02 LAB — BASIC METABOLIC PANEL
Anion gap: 7 (ref 5–15)
BUN: 31 mg/dL — ABNORMAL HIGH (ref 8–23)
CO2: 35 mmol/L — ABNORMAL HIGH (ref 22–32)
Calcium: 8.1 mg/dL — ABNORMAL LOW (ref 8.9–10.3)
Chloride: 99 mmol/L (ref 98–111)
Creatinine, Ser: 0.91 mg/dL (ref 0.61–1.24)
GFR, Estimated: >60 mL/min (ref >60)
Glucose, Bld: 143 mg/dL — ABNORMAL HIGH (ref 70–99)
Potassium: 4 mmol/L (ref 3.5–5.1)
Sodium: 141 mmol/L (ref 135–145)

## 2022-01-02 LAB — GLUCOSE, CAPILLARY
Glucose-Capillary: 117 mg/dL — ABNORMAL HIGH (ref 70–99)
Glucose-Capillary: 133 mg/dL — ABNORMAL HIGH (ref 70–99)
Glucose-Capillary: 133 mg/dL — ABNORMAL HIGH (ref 70–99)
Glucose-Capillary: 142 mg/dL — ABNORMAL HIGH (ref 70–99)

## 2022-01-02 MED ORDER — SENNA 8.6 MG PO TABS
1.0000 | ORAL_TABLET | Freq: Two times a day (BID) | ORAL | Status: DC
  Administered 2022-01-03 (×2): 8.6 mg via ORAL
  Filled 2022-01-02 (×4): qty 1

## 2022-01-02 MED ORDER — POLYETHYLENE GLYCOL 3350 17 G PO PACK
17.0000 g | PACK | Freq: Every day | ORAL | Status: DC
  Administered 2022-01-02 – 2022-01-03 (×2): 17 g via ORAL
  Filled 2022-01-02 (×3): qty 1

## 2022-01-02 NOTE — Progress Notes (Signed)
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 fib s/p watchman atrial appendage device, type II DM, GERD, HLD, HTN, presented to the hospital on 12/27/2020 for progressive dyspnea cough and shortness of breath.  Patient was in respiratory distress and was initially started on BiPAP.  Subsequently was transferred to stepdown unit.  Viral panel showed RSV.  Patient is improving but is gradual.   Assessment & Plan:  COPD with acute exacerbation Likely secondary to RSV infection.  Patient is steroid and oxygen dependant at baseline.  Patient has been seen by pulmonary during hospitalization.  On Solu-Medrol Q 60 mg q8h. Patient has completed a 5-day course of Zithromax.    Continue Pulmicort nebulizer, albuterol, Garlon Hatchet, Yupelri.   T-max of 98.1 F.  Currently on 2 L of oxygen by nasal cannula.  Patient follows up with Dr. Melvyn Novas, pulmonary as outpatient.  Pulmonary actively following in patient might need continued inpatient treatment for the next couple of days.    Former cigarette smoker Encouraged to remain abstinent.    Coronary artery calcification    On Lipitor, Zebeta and Plavix as outpatient.    Hyperlipidemia Continue Lipitor     DM type 2 with diabetic mixed hyperlipidemia  Continue sliding scale insulin,  diabetic diet.  POC glucose of 133.    Essential hypertension Continue bisoprolol.     Age-related osteoporosis with healing pathological fracture, chronic back pain       Continue oxycodone prn     Chronic diastolic congestive heart failure Continue Lasix.     Generalized anxiety disorder     Controlled.   Continue Zoloft daily           Paroxysmal atrial fibrillation        Continue Zebeta     Gastroesophageal reflux disease continue  Protonix 40 mg bid   DVT prophylaxis: Lovenox subcu  Code Status: Full code  Family Communication:  I spoke with the patient's wife  on 12/31/2021   Disposition Plan:  Home with home health likely in 2-3 days.  Barriers to Discharge: Medical improvement  Consultants:  Pulmonary  Procedures:  None  Antimicrobials:  Azithromycin 1/5>01/01/22  Anti-infectives (From admission, onward)    Start     Dose/Rate Route Frequency Ordered Stop   12/27/21 2200  azithromycin (ZITHROMAX) 500 mg in sodium chloride 0.9 % 250 mL IVPB  Status:  Discontinued        500 mg 250 mL/hr over 60 Minutes Intravenous Every 24 hours 12/27/21 1251 01/01/22 0806   12/27/21 1115  doxycycline (VIBRAMYCIN) 100 mg in sodium chloride 0.9 % 250 mL IVPB        100 mg 125 mL/hr over 120 Minutes Intravenous  Once 12/27/21 1105 12/27/21 1423       Subjective: Today, patient was seen and examined at bedside.  Patient still complains of wheezing and cough.  Complains of productive sputum.  Denies chest pain.  Feels slightly improved with breathing compared to yesterday which was a rough day for him.     Objective: Vitals:   01/01/22 2026 01/01/22 2210 01/02/22 0442 01/02/22 0621  BP:  Marland Kitchen)  115/58 (!) 118/52   Pulse:  79 73   Resp:  18 18   Temp:  98.2 F (36.8 C) 98.2 F (36.8 C)   TempSrc:   Oral   SpO2: 97% 100% 99%   Weight:    66.1 kg  Height:        Intake/Output Summary (Last 24 hours) at 01/02/2022 1223 Last data filed at 01/02/2022 0900 Gross per 24 hour  Intake 420 ml  Output 1250 ml  Net -830 ml    Filed Weights   12/29/21 0500 12/31/21 0500 01/02/22 0621  Weight: 64.8 kg 65.1 kg 66.1 kg    Examination:  General:  Average built, not in obvious distress, on nasal cannula oxygen HENT:   No scleral pallor or icterus noted. Oral mucosa is moist.   Chest:    Diminished breath sounds bilaterally.  Bilateral wheezing noted.   CVS: S1 &S2 heard. No murmur.  Regular rate and rhythm. Abdomen: Soft, nontender, nondistended.  Bowel sounds are heard.   Extremities: No cyanosis, clubbing or edema.  Peripheral pulses are palpable. Psych: Alert, awake and oriented, normal mood CNS:  No cranial nerve deficits.  Power equal in all extremities.   Skin: Warm and dry.  No rashes noted.   Data Reviewed: I have personally reviewed following labs and imaging studies  CBC: Recent Labs  Lab 12/27/21 0924 12/27/21 1208 12/29/21 0244 12/30/21 0235 12/31/21 0247 01/01/22 0422 01/02/22 0429  WBC 10.6*   < > 6.8 9.6 9.8 9.8 8.2  NEUTROABS 9.1*  --   --   --   --   --   --   HGB 13.0   < > 11.3* 10.6* 11.0* 11.5* 11.7*  HCT 42.6   < > 37.2* 35.2* 36.3* 38.4* 38.2*  MCV 81.8   < > 82.7 84.0 83.8 83.1 83.2  PLT 242   < > 198 218 244 261 262   < > = values in this interval not displayed.    Basic Metabolic Panel: Recent Labs  Lab 12/29/21 0244 12/30/21 0235 12/31/21 0247 01/01/22 0422 01/02/22 0429  NA 138 140 144 142 141  K 3.9 3.8 4.0 3.9 4.0  CL 100 102 104 104 99  CO2 30 30 33* 33* 35*  GLUCOSE 150* 135* 141* 139* 143*  BUN 28* 32* 32* 35* 31*  CREATININE 0.93 0.89 0.82 0.98 0.91  CALCIUM 8.3* 8.2* 8.3* 8.0* 8.1*    HbA1C: No results for input(s): HGBA1C in the last 72 hours.  CBG: Recent Labs  Lab 01/01/22 1145 01/01/22 1658 01/01/22 2204 01/02/22 0749 01/02/22 1200  GLUCAP 153* 121* 133* 117* 133*    Recent Results (from the past 240 hour(s))  Resp Panel by RT-PCR (Flu A&B, Covid) Nasopharyngeal Swab     Status: None   Collection Time: 12/27/21  9:24 AM   Specimen: Nasopharyngeal Swab; Nasopharyngeal(NP) swabs in vial transport medium  Result Value Ref Range Status   SARS Coronavirus 2 by RT PCR NEGATIVE NEGATIVE Final    Comment: (NOTE) SARS-CoV-2 target nucleic acids are NOT DETECTED.  The SARS-CoV-2 RNA is  generally detectable in upper respiratory specimens during the acute phase of infection. The lowest concentration of SARS-CoV-2 viral copies this assay can detect is 138 copies/mL. A negative result does not preclude SARS-Cov-2 infection and should not be used as the sole basis for treatment or other patient management decisions. A negative result may occur with  improper specimen collection/handling, submission of specimen other than nasopharyngeal swab,  presence of viral mutation(s) within the areas targeted by this assay, and inadequate number of viral copies(<138 copies/mL). A negative result must be combined with clinical observations, patient history, and epidemiological information. The expected result is Negative.  Fact Sheet for Patients:  EntrepreneurPulse.com.au  Fact Sheet for Healthcare Providers:  IncredibleEmployment.be  This test is no t yet approved or cleared by the [place] FDA and  has been authorized for detection and/or diagnosis of SARS-CoV-2 by FDA under an Emergency Use Authorization (EUA). This EUA will remain  in effect (meaning this test can be used) for the duration of the COVID-19 declaration under Section 564(b)(1) of the Act, 21 U.S.C.section 360bbb-3(b)(1), unless the authorization is terminated  or revoked sooner.       Influenza A by PCR NEGATIVE NEGATIVE Final   Influenza B by PCR NEGATIVE NEGATIVE Final    Comment: (NOTE) The Xpert Xpress SARS-CoV-2/FLU/RSV plus assay is intended as an aid in the diagnosis of influenza from Nasopharyngeal swab specimens and should not be used as a sole basis for treatment. Nasal washings and aspirates are unacceptable for Xpert Xpress SARS-CoV-2/FLU/RSV testing.  Fact Sheet for Patients: EntrepreneurPulse.com.au  Fact Sheet for Healthcare Providers: IncredibleEmployment.be  This test is not yet approved or cleared by the [place] FDA and has been authorized for detection and/or diagnosis of SARS-CoV-2 by FDA under an Emergency Use Authorization (EUA). This EUA will remain in effect (meaning this test can be used) for the duration of the COVID-19 declaration under Section 564(b)(1) of the Act, 21 U.S.C. section 360bbb-3(b)(1), unless the authorization is terminated or revoked.  Performed at [facility], [place] [address], [place], [place] [address]   Respiratory (~20 pathogens) panel by PCR     Status: Abnormal   Collection Time: 12/28/21  9:57 PM   Specimen: Nasopharyngeal Swab; Respiratory  Result Value Ref Range Status   Adenovirus NOT DETECTED NOT DETECTED Final   Coronavirus 229E NOT DETECTED NOT DETECTED Final    Comment: (NOTE) The Coronavirus on the Respiratory Panel, DOES NOT test for the novel  Coronavirus (2019 nCoV)    Coronavirus HKU1 NOT DETECTED NOT DETECTED Final   Coronavirus NL63 NOT DETECTED NOT DETECTED Final   Coronavirus OC43 NOT DETECTED NOT DETECTED Final   Metapneumovirus NOT DETECTED NOT DETECTED Final   Rhinovirus / Enterovirus NOT DETECTED NOT DETECTED Final   Influenza A NOT DETECTED NOT DETECTED Final   Influenza B NOT DETECTED NOT DETECTED Final   Parainfluenza Virus 1 NOT DETECTED NOT DETECTED Final   Parainfluenza Virus 2 NOT DETECTED NOT DETECTED Final   Parainfluenza Virus 3 NOT DETECTED NOT DETECTED Final   Parainfluenza Virus 4 NOT DETECTED NOT DETECTED Final   Respiratory Syncytial Virus DETECTED (A) NOT DETECTED Final   Bordetella pertussis NOT DETECTED NOT DETECTED Final   Bordetella Parapertussis NOT DETECTED NOT DETECTED Final   Chlamydophila pneumoniae NOT DETECTED NOT DETECTED Final   Mycoplasma pneumoniae NOT DETECTED NOT DETECTED Final    Comment: Performed at [facility], [place]. [address], [place], [place] [address]  MRSA Next Gen by PCR, Nasal     Status: None   Collection Time: 12/28/21 10:47 PM   Specimen: Nasal Mucosa; Nasal  Swab  Result Value Ref Range Status   MRSA by PCR Next Gen NOT DETECTED NOT DETECTED Final    Comment: (NOTE) The GeneXpert MRSA Assay (FDA approved for NASAL specimens only), is one component of a comprehensive MRSA colonization surveillance program. It  is not intended to diagnose MRSA infection nor to guide or monitor treatment for MRSA infections. Test performance is not FDA approved in patients less than 41 years old. Performed at [facility], [place] [address] [place], [place] [address]      Scheduled Meds:  arformoterol  15 mcg Nebulization BID   vitamin C  1,000 mg Oral Daily   atorvastatin  40 mg Oral Daily   bisoprolol  5 mg Oral Daily   budesonide (PULMICORT) nebulizer solution  0.5 mg Nebulization BID   calcium carbonate  1 tablet Oral Q breakfast   Chlorhexidine Gluconate Cloth  6 each Topical Q0600   cholecalciferol  2,000 Units Oral Daily   clopidogrel  75 mg Oral Daily   enoxaparin (LOVENOX) injection  40 mg Subcutaneous Q24H   ferrous sulfate  325 mg Oral BID WC   furosemide  40 mg Oral Daily   guaiFENesin  600 mg Oral BID   insulin aspart  0-15 Units Subcutaneous TID WC   loratadine  10 mg Oral q AM   mouth rinse  15 mL Mouth Rinse BID   methylPREDNISolone (SOLU-MEDROL) injection  60 mg Intravenous Q8H   montelukast  10 mg Oral QHS   multivitamin with minerals  1 tablet Oral Daily   pantoprazole  40 mg Oral BID AC   revefenacin  175 mcg Nebulization Daily   sertraline  50 mg Oral Daily   Continuous Infusions:  lactated ringers 10 mL/hr at 12/31/21 2121     LOS: 6 days    Flora Lipps, MD [facility] 01/02/2022, 12:23 PM

## 2022-01-02 NOTE — Progress Notes (Signed)
Physical Therapy Treatment/Discharge Note Patient Details Name: Paul [name] MRN: [id number] DOB: March 03, 1946 Today's Date: 01/02/2022   History of Present Illness Pt admitted from home 2* SOB and dx with COPD exac and RSV.  Pt with hx of COPD (home O2 as needed primarily at night), a-fib, DM CHF, osteoporosis with T8 compression fx (2020)    PT Comments    Progressing well. Mod Ind today. O2 91% on 3L while ambulation, 97% on 2L at rest. Pt requested O2 extension to be able to ambulate to/from bathroom-verified that this was okay with RN. No further acute PT needs. Recommend daily hallway ambulation to increase activity. Will d/c pt from PT caseload.     Recommendations for follow up therapy are one component of a multi-disciplinary discharge planning process, led by the attending physician.  Recommendations may be updated based on patient status, additional functional criteria and insurance authorization.  Follow Up Recommendations  No PT follow up     Assistance Recommended at Discharge PRN  Patient can return home with the following     Equipment Recommendations  None recommended by PT    Recommendations for Other Services       Precautions / Restrictions Precautions Precautions: Fall Restrictions Weight Bearing Restrictions: No     Mobility  Bed Mobility Overal bed mobility: Independent                  Transfers Overall transfer level: Independent                      Ambulation/Gait Ambulation/Gait assistance: Modified independent (Device/Increase time) Gait Distance (Feet): 225 Feet Assistive device: None Gait Pattern/deviations: Step-through pattern;Decreased stride length       General Gait Details: Fair gait speed. No overt LOB. O2 91% on 3L   Stairs             Wheelchair Mobility    Modified Rankin (Stroke Patients Only)       Balance Overall balance assessment: Mild deficits observed, not formally tested          Standing balance support: During functional activity;No upper extremity supported Standing balance-Leahy Scale: Good                              Cognition Arousal/Alertness: Awake/alert Behavior During Therapy: WFL for tasks assessed/performed Overall Cognitive Status: Within Functional Limits for tasks assessed                                          Exercises      General Comments        Pertinent Vitals/Pain Pain Assessment: No/denies pain    Home Living                          Prior Function            PT Goals (current goals can now be found in the care plan section) Progress towards PT goals: Goals met/education completed, patient discharged from PT    Frequency    Min 3X/week      PT Plan Current plan remains appropriate    Co-evaluation              AM-PAC PT "6 Clicks" Mobility   Outcome Measure  Help needed  turning from your back to your side while in a flat bed without using bedrails?: None Help needed moving from lying on your back to sitting on the side of a flat bed without using bedrails?: None Help needed moving to and from a bed to a chair (including a wheelchair)?: None Help needed standing up from a chair using your arms (e.g., wheelchair or bedside chair)?: None Help needed to walk in hospital room?: None Help needed climbing 3-5 steps with a railing? : A Little 6 Click Score: 23    End of Session Equipment Utilized During Treatment: Oxygen Activity Tolerance: Patient tolerated treatment well Patient left: in chair;with call bell/phone within reach   PT Visit Diagnosis: Difficulty in walking, not elsewhere classified (R26.2)     Time: 4868-8520 PT Time Calculation (min) (ACUTE ONLY): 23 min  Charges:  $Gait Training: 23-37 mins                        Doreatha Massed, PT Acute Rehabilitation  Office: [phone] Pager: [phone]

## 2022-01-02 NOTE — Progress Notes (Signed)
NAME:  Paul [name], MRN:  [id number], DOB:  July 19, 1946, LOS: 6 ADMISSION DATE:  12/27/2021, CONSULTATION DATE: 1/62023 REFERRING MD:  Dr. Algis Liming, [facility]IE[facility] COMPLAINT:  Short of breath   History of Present Illness:  76 yo male former smoker presented to [facility] ER with dyspnea, productive cough and increasing oxygen needs.  Associated with subjective fever.  Admitted for COPD exacerbation and tested positive for RSV.  PCCM consulted to assist with respiratory management.  He is followed by Dr. Melvyn Novas in the pulmonary office.  He is enrolled with palliative care services through [place].  Pertinent  Medical History  Anemia, A fib s/p Watchman procedure, COVID 10 January 2021, DM type 2, Anxiety, GERD, HLD, HTN, Osteoporosis, COPD with chronic respiratory failure on 2 liters with exertion and sleep  Studies:  PFT 05/29/16 >> FEV1 0.83 (31%), FEV1% 40, TLC 6.56 (107%), DLCO 60% A1AT 06/15/19 >> 178, MM Echo 07/09/21 >> EF 60 to 65%, grade 1 DD Cardiac CT 07/18/21 >> moderate centrilobular emphysema, stable 4 mm nodule RUL  Interim History / Subjective:  Still has cough, wheeze, and sputum.  Feels some improvement compared to yesterday.  Objective   Blood pressure (!) 118/52, pulse 73, temperature 98.2 F (36.8 C), temperature source Oral, resp. rate 18, height 5\' 6"  (1.676 m), weight 66.1 kg, SpO2 99 %.        Intake/Output Summary (Last 24 hours) at 01/02/2022 1000 Last data filed at 01/02/2022 0900 Gross per 24 hour  Intake 420 ml  Output 1250 ml  Net -830 ml   Filed Weights   12/29/21 0500 12/31/21 0500 01/02/22 8416  Weight: 64.8 kg 65.1 kg 66.1 kg    Examination:  General - alert Eyes - pupils reactive ENT - no sinus tenderness, no stridor Cardiac - regular rate/rhythm, no murmur Chest - faint b/l wheeze, better air movement Abdomen - soft, non tender, + bowel sounds Extremities - missing parts of his fingers, no edema Skin - no rashes Neuro - normal strength, moves  extremities, follows commands Psych - normal mood and behavior   Assessment & Plan:   Acute on chronic hypoxic respiratory failure in setting of COPD exacerbation from RSV respiratory infection. - he is slow to recover from exacerbation and still has significant wheezing with dyspnea - completed 5 days of zithromax on 1/09 - continue solumedrol 60 mg q8h; he is steroid dependent and use prednisone 10 mg qod and 5 mg qod - continue brovana, yupelri, pulmicort, singulair - prn albuterol - continue bronchial hygiene; chest percussion vest helped with expectoration - goal SpO2 90 to 95% - Bipap prn  CAD, HTN, HLD, DM type 2, Osteoporosis, Chronic diastolic CHF, Anxiety, PAF, GERD. - per primary team  Disposition. - he is improving, but don't think he is ready for hospital discharge yet - if he continues on same trajectory then I would anticipate him being ready for hospital discharge by 01/04/22  Labs    CMP Latest Ref Rng & Units 01/02/2022 01/01/2022 12/31/2021  Glucose 70 - 99 mg/dL 143(H) 139(H) 141(H)  BUN 8 - 23 mg/dL 31(H) 35(H) 32(H)  Creatinine 0.61 - 1.24 mg/dL 0.91 0.98 0.82  Sodium 135 - 145 mmol/L 141 142 144  Potassium 3.5 - 5.1 mmol/L 4.0 3.9 4.0  Chloride 98 - 111 mmol/L 99 104 104  CO2 22 - 32 mmol/L 35(H) 33(H) 33(H)  Calcium 8.9 - 10.3 mg/dL 8.1(L) 8.0(L) 8.3(L)  Total Protein 6.0 - 8.5 g/dL - - -  Total Bilirubin 0.0 - 1.2 mg/dL - - -  Alkaline Phos 44 - 121 IU/L - - -  AST 0 - 40 IU/L - - -  ALT 0 - 44 IU/L - - -   CBC Latest Ref Rng & Units 01/02/2022 01/01/2022 12/31/2021  WBC 4.0 - 10.5 K/uL 8.2 9.8 9.8  Hemoglobin 13.0 - 17.0 g/dL 11.7(L) 11.5(L) 11.0(L)  Hematocrit 39.0 - 52.0 % 38.2(L) 38.4(L) 36.3(L)  Platelets 150 - 400 K/uL 262 261 244    ABG    Component Value Date/Time   PHART 7.533 (H) 03/19/2019 1434   PCO2ART 37.9 03/19/2019 1434   PO2ART 91.0 03/19/2019 1434   HCO3 38.6 (H) 03/27/2020 0036   TCO2 40 (H) 03/27/2020 0036   O2SAT 100.0  03/27/2020 0036    CBG (last 3)  Recent Labs    01/01/22 1658 01/01/22 2204 01/02/22 0749  GLUCAP 121* 133* 117*   Signature:  Chesley Mires, MD [place] Pager - [phone] 01/02/2022, 10:00 AM

## 2022-01-03 DIAGNOSIS — M8000XD Age-related osteoporosis with current pathological fracture, unspecified site, subsequent encounter for fracture with routine healing: Secondary | ICD-10-CM | POA: Diagnosis not present

## 2022-01-03 DIAGNOSIS — I5032 Chronic diastolic (congestive) heart failure: Secondary | ICD-10-CM | POA: Diagnosis not present

## 2022-01-03 DIAGNOSIS — J9611 Chronic respiratory failure with hypoxia: Secondary | ICD-10-CM | POA: Diagnosis not present

## 2022-01-03 DIAGNOSIS — J441 Chronic obstructive pulmonary disease with (acute) exacerbation: Secondary | ICD-10-CM | POA: Diagnosis not present

## 2022-01-03 LAB — CBC
HCT: 37.2 % — ABNORMAL LOW (ref 39.0–52.0)
Hemoglobin: 11.5 g/dL — ABNORMAL LOW (ref 13.0–17.0)
MCH: 25.3 pg — ABNORMAL LOW (ref 26.0–34.0)
MCHC: 30.9 g/dL (ref 30.0–36.0)
MCV: 81.9 fL (ref 80.0–100.0)
Platelets: 230 10*3/uL (ref 150–400)
RBC: 4.54 MIL/uL (ref 4.22–5.81)
RDW: 15.5 % (ref 11.5–15.5)
WBC: 8.8 10*3/uL (ref 4.0–10.5)
nRBC: 0 % (ref 0.0–0.2)

## 2022-01-03 LAB — BASIC METABOLIC PANEL
Anion gap: 7 (ref 5–15)
BUN: 32 mg/dL — ABNORMAL HIGH (ref 8–23)
CO2: 36 mmol/L — ABNORMAL HIGH (ref 22–32)
Calcium: 8.2 mg/dL — ABNORMAL LOW (ref 8.9–10.3)
Chloride: 97 mmol/L — ABNORMAL LOW (ref 98–111)
Creatinine, Ser: 0.87 mg/dL (ref 0.61–1.24)
GFR, Estimated: >60 mL/min (ref >60)
Glucose, Bld: 147 mg/dL — ABNORMAL HIGH (ref 70–99)
Potassium: 4.7 mmol/L (ref 3.5–5.1)
Sodium: 140 mmol/L (ref 135–145)

## 2022-01-03 LAB — GLUCOSE, CAPILLARY
Glucose-Capillary: 126 mg/dL — ABNORMAL HIGH (ref 70–99)
Glucose-Capillary: 169 mg/dL — ABNORMAL HIGH (ref 70–99)
Glucose-Capillary: 168 mg/dL — ABNORMAL HIGH (ref 70–99)
Glucose-Capillary: 133 mg/dL — ABNORMAL HIGH (ref 70–99)

## 2022-01-03 MED ORDER — PREDNISONE 20 MG PO TABS
40.0000 mg | ORAL_TABLET | Freq: Every day | ORAL | Status: DC
  Administered 2022-01-03 – 2022-01-04 (×2): 40 mg via ORAL
  Filled 2022-01-03 (×2): qty 2

## 2022-01-03 MED ORDER — PREDNISONE 20 MG PO TABS: 40.0000 mg | ORAL_TABLET | Freq: Every day | ORAL | Status: DC

## 2022-01-03 NOTE — Progress Notes (Signed)
NAME:  Paul [name], MRN:  [id number], DOB:  [birth year], LOS: 7 ADMISSION DATE:  12/27/2021, CONSULTATION DATE: 1/62023 REFERRING MD:  Dr. Algis Liming, [facility] CHIEF COMPLAINT:  Short of breath   History of Present Illness:  76 y/o male former smoker presented to [facility] ER with dyspnea, productive cough and increasing oxygen needs.  Associated with subjective fever.  Admitted for COPD exacerbation and tested positive for RSV.  PCCM consulted to assist with respiratory management.  He is followed by Dr. Melvyn Novas in the pulmonary office.  He is enrolled with palliative care services through [place].  Pertinent  Medical History  Anemia, A fib s/p Watchman procedure, COVID 10 January 2021, DM type 2, Anxiety, GERD, HLD, HTN, Osteoporosis, COPD with chronic respiratory failure on 2 liters with exertion and sleep  Studies:  PFT 05/29/16 >> FEV1 0.83 (31%), FEV1% 40, TLC 6.56 (107%), DLCO 60% A1AT 06/15/19 >> 178, MM Echo 07/09/21 >> EF 60 to 65%, grade 1 DD Cardiac CT 07/18/21 >> moderate centrilobular emphysema, stable 4 mm nodule RUL  Interim History / Subjective:  Pt reports feeling better than yesterday but not back to near baseline. Ambulating in room for restroom needs.  O2 at 2L  Afebrile   Objective   Blood pressure 122/75, pulse 73, temperature 97.6 F (36.4 C), temperature source Oral, resp. rate 20, height 5\' 6"  (1.676 m), weight 66.1 kg, SpO2 99 %.        Intake/Output Summary (Last 24 hours) at 01/03/2022 0908 Last data filed at 01/02/2022 1559 Gross per 24 hour  Intake 220 ml  Output --  Net 220 ml   Filed Weights   12/29/21 0500 12/31/21 0500 01/02/22 4098  Weight: 64.8 kg 65.1 kg 66.1 kg    Examination: General: elderly adult male lying in bed in NAD HEENT: MM pink/moist, [place] O2, anicteric Neuro: AAOx4, speech clear, MAE / non-focal CV: s1s2 RRR, no m/r/g PULM: non-labored at rest, lungs with wheezing bilaterally posterior / anterior  GI: soft, bsx4 active, tolerating  PO's  Extremities: warm/dry, no edema, missing 2nd DIP joint down on 2nd / 3rd fingers on left hand Skin: no rashes or lesions  Assessment & Plan:   Acute on chronic hypoxic respiratory failure in setting of COPD exacerbation from RSV respiratory infection. -slowly improving exacerbation, nearing baseline but still has wheezing  -completed abx 1/9 -pulmonary hygiene - IS, mobilize, chest PT  -wean O2 to baseline 2L, assess ambulatory O2 needs on baseline to ensure he does not need increase prior to discharge  -O2 goal 90-95% -continue brovana, pulmicort, yupelri while inpatient  -continue singulair  -PRN albuterol  -discharge medication plan: return to [facility], albuterol.   -transition solumedrol to prednisone 40 mg 1/12, continue on 40 mg until seen in Pulmonary office and then begin taper back to 5 mg baseline   CAD, HTN, HLD, DM type 2, Osteoporosis, Chronic diastolic CHF, Anxiety, PAF, GERD. -per primary   Disposition. -anticipate discharge 1/13  -pulmonary follow up arranged, see discharge follow up section  Labs    CMP Latest Ref Rng & Units 01/03/2022 01/02/2022 01/01/2022  Glucose 70 - 99 mg/dL 147(H) 143(H) 139(H)  BUN 8 - 23 mg/dL 32(H) 31(H) 35(H)  Creatinine 0.61 - 1.24 mg/dL 0.87 0.91 0.98  Sodium 135 - 145 mmol/L 140 141 142  Potassium 3.5 - 5.1 mmol/L 4.7 4.0 3.9  Chloride 98 - 111 mmol/L 97(L) 99 104  CO2 22 - 32 mmol/L 36(H) 35(H) 33(H)  Calcium 8.9 -  10.3 mg/dL 8.2(L) 8.1(L) 8.0(L)  Total Protein 6.0 - 8.5 g/dL - - -  Total Bilirubin 0.0 - 1.2 mg/dL - - -  Alkaline Phos 44 - 121 IU/L - - -  AST 0 - 40 IU/L - - -  ALT 0 - 44 IU/L - - -   CBC Latest Ref Rng & Units 01/03/2022 01/02/2022 01/01/2022  WBC 4.0 - 10.5 K/uL 8.8 8.2 9.8  Hemoglobin 13.0 - 17.0 g/dL 11.5(L) 11.7(L) 11.5(L)  Hematocrit 39.0 - 52.0 % 37.2(L) 38.2(L) 38.4(L)  Platelets 150 - 400 K/uL 230 262 261    ABG    Component Value Date/Time   PHART 7.533 (H) 03/19/2019 1434   PCO2ART 37.9  03/19/2019 1434   PO2ART 91.0 03/19/2019 1434   HCO3 38.6 (H) 03/27/2020 0036   TCO2 40 (H) 03/27/2020 0036   O2SAT 100.0 03/27/2020 0036    CBG (last 3)  Recent Labs    01/02/22 1558 01/02/22 2010 01/03/22 0724  GLUCAP 133* 142* 126*   Signature:  Noe Gens, MSN, APRN, NP-C, AGACNP-BC [facility] 01/03/2022, 9:08 AM   Please see [email or web address] for pager details.

## 2022-01-03 NOTE — Plan of Care (Signed)

## 2022-01-03 NOTE — Progress Notes (Signed)
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 fib s/p watchman atrial appendage device, type II DM, GERD, HLD, HTN, presented to the hospital on 12/27/2020 for progressive dyspnea cough and shortness of breath.  Patient was in respiratory distress and was initially started on BiPAP.  Subsequently was transferred to stepdown unit.  Viral panel showed RSV.  Patient is improving but is gradual.   Assessment & Plan:  COPD with acute exacerbation Likely secondary to RSV infection.  Patient is steroid and oxygen dependant at baseline.  Patient has been seen by pulmonary during hospitalization.  Patient was on Solu-Medrol Q 60 mg q8h. this has been changed to prednisone 40 daily.  Patient has completed a 5-day course of Zithromax.    Continue Pulmicort nebulizer, albuterol, Garlon Hatchet, Yupelri.   Afebrile currently on 2 L of oxygen by nasal cannula.  Patient follows up with Dr. Melvyn Novas, pulmonary as outpatient.  Pulmonary actively following, continues to improve.  Likely disposition home tomorrow.  Patient will follow up with pulmonary clinic on 01/10/2022.  Spoke with with pulmonary at bedside.    Former cigarette smoker Encouraged to remain abstinent.    Coronary artery calcification    On Lipitor, Zebeta and Plavix as outpatient.    Hyperlipidemia Continue Lipitor     DM type 2 with diabetic mixed hyperlipidemia  Continue sliding scale insulin,  diabetic diet.  POC glucose of 133.    Essential hypertension Continue bisoprolol.     Age-related osteoporosis with healing pathological fracture, chronic back pain       Continue oxycodone  prn     Chronic diastolic congestive heart failure Continue Lasix.     Generalized anxiety disorder     Controlled.  Continue Zoloft daily           Paroxysmal atrial fibrillation        Continue Zebeta     Gastroesophageal reflux disease continue  Protonix 40 mg bid   DVT prophylaxis: Lovenox subcu  Code Status: Full code  Family Communication:  I spoke with the patient's wife  on 01/03/2022 and updated her about the clinical condition of the patient and tentative plan for disposition home tomorrow.  Disposition Plan:  Home with home health likely tomorrow.  Barriers to Discharge: Medical improvement  Consultants:  Pulmonary  Procedures:  None  Antimicrobials:  Azithromycin 1/5>01/01/22  Anti-infectives (From admission, onward)    Start     Dose/Rate Route Frequency Ordered Stop   12/27/21 2200  azithromycin (ZITHROMAX) 500 mg in sodium chloride 0.9 % 250 mL IVPB  Status:  Discontinued        500 mg 250 mL/hr over 60 Minutes Intravenous Every 24 hours 12/27/21 1251 01/01/22 0806   12/27/21 1115  doxycycline (VIBRAMYCIN) 100 mg in sodium chloride 0.9 % 250 mL IVPB        100 mg 125 mL/hr over 120 Minutes Intravenous  Once 12/27/21 1105 12/27/21 1423      Subjective: Today, patient was seen and examined at bedside.  Patient continues to feel better.  Has mild cough.  No chest pain, dizziness or fever or chills.  Objective: Vitals:   01/02/22 2030 01/03/22 0624 01/03/22 0822 01/03/22 0824  BP:  122/75    Pulse:  73    Resp:  20    Temp:  97.6 F (36.4 C)    TempSrc:  Oral    SpO2: 97% 98% 99% 99%  Weight:      Height:        Intake/Output Summary (Last 24 hours) at 01/03/2022 1014 Last data filed at 01/02/2022 1559 Gross per 24 hour  Intake 220 ml  Output --  Net 220 ml    Filed Weights   12/29/21 0500 12/31/21 0500 01/02/22 0621  Weight: 64.8 kg 65.1 kg 66.1 kg    Examination:  General:  Average built, not in obvious distress, on nasal  cannula oxygen HENT:   No scleral pallor or icterus noted. Oral mucosa is moist.  Chest:    Diminished breath sounds bilaterally.  Coarse breath sounds noted with mild wheezes. CVS: S1 &S2 heard. No murmur.  Regular rate and rhythm. Abdomen: Soft, nontender, nondistended.  Bowel sounds are heard.   Extremities: No cyanosis, clubbing or edema.  Peripheral pulses are palpable. Psych: Alert, awake and oriented, normal mood CNS:  No cranial nerve deficits.  Power equal in all extremities.   Skin: Warm and dry.  No rashes noted.  Data Reviewed: I have personally reviewed following labs and imaging studies  CBC: Recent Labs  Lab 12/30/21 0235 12/31/21 0247 01/01/22 0422 01/02/22 0429 01/03/22 0408  WBC 9.6 9.8 9.8 8.2 8.8  HGB 10.6* 11.0* 11.5* 11.7* 11.5*  HCT 35.2* 36.3* 38.4* 38.2* 37.2*  MCV 84.0 83.8 83.1 83.2 81.9  PLT 218 244 261 262 295    Basic Metabolic Panel: Recent Labs  Lab 12/30/21 0235 12/31/21 0247 01/01/22 0422 01/02/22 0429 01/03/22 0408  NA 140 144 142 141 140  K 3.8 4.0 3.9 4.0 4.7  CL 102 104 104 99 97*  CO2 30 33* 33* 35* 36*  GLUCOSE 135* 141* 139* 143* 147*  BUN 32* 32* 35* 31* 32*  CREATININE 0.89 0.82 0.98 0.91 0.87  CALCIUM 8.2* 8.3* 8.0* 8.1* 8.2*    HbA1C: No results for input(s): HGBA1C in the last 72 hours.  CBG: Recent Labs  Lab 01/02/22 0749 01/02/22 1200 01/02/22 1558 01/02/22 2010 01/03/22 0724  GLUCAP 117* 133* 133* 142* 126*    Recent Results (from the past 240 hour(s))  Resp Panel by RT-PCR (Flu A&B, Covid) Nasopharyngeal Swab     Status: None   Collection Time: 12/27/21  9:24 AM   Specimen: Nasopharyngeal Swab; Nasopharyngeal(NP) swabs in vial transport medium  Result Value Ref Range Status   SARS Coronavirus 2 by RT PCR NEGATIVE NEGATIVE Final    Comment: (NOTE) SARS-CoV-2 target nucleic acids are NOT DETECTED.  The SARS-CoV-2 RNA is generally detectable in upper respiratory specimens during the acute phase of  infection. The lowest concentration of SARS-CoV-2 viral copies this assay can detect is 138 copies/mL. A negative result does not preclude SARS-Cov-2 infection and should not be used as the sole basis for treatment or other patient management decisions. A negative result may occur with  improper specimen collection/handling, submission of specimen other than nasopharyngeal swab, presence of viral mutation(s) within the areas targeted by this assay, and inadequate number of viral copies(<138 copies/mL). A negative result must be combined with clinical observations, patient history, and epidemiological information. The expected result is Negative.  Fact Sheet for Patients:  EntrepreneurPulse.com.au  Fact Sheet for Healthcare Providers:  IncredibleEmployment.be  This test is no t yet approved or cleared by the [place] and  has been authorized for detection and/or diagnosis of SARS-CoV-2 by FDA under an Emergency Use Authorization (EUA). This EUA will remain  in effect (meaning this test can be used) for the duration of the COVID-19 declaration under Section 564(b)(1) of the Act, 21 U.S.C.section 360bbb-3(b)(1), unless the authorization is terminated  or revoked sooner.       Influenza A by PCR NEGATIVE NEGATIVE Final   Influenza B by PCR NEGATIVE NEGATIVE Final    Comment: (NOTE) The Xpert Xpress SARS-CoV-2/FLU/RSV plus assay is intended as an aid in the diagnosis of influenza from Nasopharyngeal swab specimens and should not be used as a sole basis for treatment. Nasal washings and aspirates are unacceptable for Xpert Xpress SARS-CoV-2/FLU/RSV testing.  Fact Sheet for Patients: EntrepreneurPulse.com.au  Fact Sheet for Healthcare Providers: IncredibleEmployment.be  This test is not yet approved or cleared by the [place] FDA and has been authorized for detection and/or diagnosis of SARS-CoV-2  by FDA under an Emergency Use Authorization (EUA). This EUA will remain in effect (meaning this test can be used) for the duration of the COVID-19 declaration under Section 564(b)(1) of the Act, 21 U.S.C. section 360bbb-3(b)(1), unless the authorization is terminated or revoked.  Performed at [facility], [place] [address], [place], [place] [address]   Respiratory (~20 pathogens) panel by PCR     Status: Abnormal   Collection Time: 12/28/21  9:57 PM   Specimen: Nasopharyngeal Swab; Respiratory  Result Value Ref Range Status   Adenovirus NOT DETECTED NOT DETECTED Final   Coronavirus 229E NOT DETECTED NOT DETECTED Final    Comment: (NOTE) The Coronavirus on the Respiratory Panel, DOES NOT test for the novel  Coronavirus (2019 nCoV)    Coronavirus HKU1 NOT DETECTED NOT DETECTED Final   Coronavirus NL63 NOT DETECTED NOT DETECTED Final   Coronavirus OC43 NOT DETECTED NOT DETECTED Final   Metapneumovirus NOT DETECTED NOT DETECTED Final   Rhinovirus / Enterovirus NOT DETECTED NOT DETECTED Final   Influenza A NOT DETECTED NOT DETECTED Final   Influenza B NOT DETECTED NOT DETECTED Final   Parainfluenza Virus 1 NOT DETECTED NOT DETECTED Final   Parainfluenza Virus 2 NOT DETECTED NOT DETECTED Final   Parainfluenza Virus 3 NOT DETECTED NOT DETECTED Final   Parainfluenza Virus 4 NOT DETECTED NOT DETECTED Final   Respiratory Syncytial Virus DETECTED (A) NOT DETECTED Final   Bordetella pertussis NOT DETECTED NOT DETECTED Final   Bordetella Parapertussis NOT DETECTED NOT DETECTED Final   Chlamydophila pneumoniae NOT DETECTED NOT DETECTED Final   Mycoplasma pneumoniae NOT DETECTED NOT DETECTED Final    Comment: Performed at [facility], [place]. [address], [place], [place] [address]  MRSA Next Gen by PCR, Nasal     Status: None   Collection Time: 12/28/21 10:47 PM   Specimen: Nasal Mucosa; Nasal Swab  Result Value Ref Range Status   MRSA by PCR Next Gen NOT DETECTED NOT  DETECTED Final    Comment: (NOTE) The GeneXpert MRSA Assay (FDA approved for NASAL specimens only), is one component of a comprehensive MRSA colonization surveillance program. It is not intended to diagnose MRSA infection nor to guide or monitor treatment for MRSA infections. Test performance is not FDA approved in patients less than 38 years old. Performed at [facility], [place] [address], [place], [place] [address]      Scheduled Meds:  arformoterol  15 mcg Nebulization BID   vitamin C  1,000 mg Oral Daily   atorvastatin  40 mg Oral Daily   bisoprolol  5 mg Oral Daily   budesonide (PULMICORT) nebulizer solution  0.5 mg Nebulization BID   calcium carbonate  1 tablet Oral Q breakfast   Chlorhexidine Gluconate Cloth  6 each Topical Q0600   cholecalciferol  2,000 Units Oral Daily   clopidogrel  75 mg Oral Daily   enoxaparin (LOVENOX) injection  40 mg Subcutaneous Q24H   ferrous sulfate  325 mg Oral BID WC   furosemide  40 mg Oral Daily   guaiFENesin  600 mg Oral BID   insulin aspart  0-15 Units Subcutaneous TID WC   loratadine  10 mg Oral q AM   mouth rinse  15 mL Mouth Rinse BID   montelukast  10 mg Oral QHS   multivitamin with minerals  1 tablet Oral Daily   pantoprazole  40 mg Oral BID AC   polyethylene glycol  17 g Oral Daily   predniSONE  40 mg Oral Q breakfast   revefenacin  175 mcg Nebulization Daily   senna  1 tablet Oral BID   sertraline  50 mg Oral Daily   Continuous Infusions:  lactated ringers 10 mL/hr at 12/31/21 2121     LOS: 7 days    Flora Lipps, MD [facility] 01/03/2022, 10:14 AM

## 2022-01-03 NOTE — TOC Progression Note (Signed)
Transition of Care [facility]) - Progression Note    Patient Details  Name: Paul [name] MRN: [id number] Date of Birth: 1946/11/06  [facility]) CM/SW Contact  Roneshia Drew, Juliann Pulse, RN Phone Number: 01/03/2022, 1:18 PM  Clinical Narrative: Uses [employer] home 02 only @ HS PTA-monitor if increase requirements needed-[employer] following-awaiting 02 sats, & order if needed.      Expected Discharge Plan: Home/Self Care Barriers to Discharge: Continued Medical Work up  Expected Discharge Plan and Services Expected Discharge Plan: Home/Self Care   Discharge Planning Services: CM Consult Post Acute Care Choice:  (home) Living arrangements for the past 2 months: Single Family Home                                       Social Determinants of Health (SDOH) Interventions    Readmission Risk Interventions Readmission Risk Prevention Plan 01/01/2022 01/15/2021 04/05/2020  Transportation Screening Complete Complete Complete  PCP or Specialist Appt within 3-5 Days Complete - -  Not Complete comments - - -  [place] or Home Care Consult Complete - -  Social Work Consult for [place] Planning/Counseling Complete - -  Palliative Care Screening Complete - -  Medication Review Press photographer) Complete Complete Complete  PCP or Specialist appointment within 3-5 days of discharge - Complete Complete  HRI or [place] - Complete Complete  SW Recovery Care/Counseling Consult - Complete Complete  Palliative Care Screening - Not Applicable Complete  [place] - Not Applicable Not Applicable  Some recent data might be hidden

## 2022-01-03 NOTE — Progress Notes (Signed)
°   01/03/22 1300  Oxygen Therapy  SpO2 (!) 87 %  O2 Device Room Air  Patient Activity (if Appropriate) Ambulating  Mobility  Activity Ambulated in hall  Level of Assistance Contact guard assist, steadying assist  Assistive Device None  Distance Ambulated (ft) 60 ft  Mobility Ambulated with assistance in hallway  Mobility Response Tolerated well  Mobility performed by Mobility specialist  $Mobility charge 1 Mobility   Pt agreeable to mobilize this afternoon. RN stated pt not currently on O2 and requested to attempt ambulation without O2. Pt ambulated about 14ft in hall, tolerated well. Pt stated he felt dizzy upon standing but this subsided as session continued. Pt O2 dropped to 85%, but he denied dizziness. Left pt in chair with O2 reconnected, call bell at side and family present. RN notified of session.  Mobility Specialist - Progress Note Pre-mobility: SpO2 92% -- no O2, sitting During mobility: SpO2 89-85% -- no O2 Post-mobility:  SPO2 91% --2L O2   [place] Specialist Acute Rehab Services Office: [phone]

## 2022-01-04 DIAGNOSIS — J441 Chronic obstructive pulmonary disease with (acute) exacerbation: Secondary | ICD-10-CM | POA: Diagnosis not present

## 2022-01-04 DIAGNOSIS — E785 Hyperlipidemia, unspecified: Secondary | ICD-10-CM

## 2022-01-04 LAB — GLUCOSE, CAPILLARY: Glucose-Capillary: 76 mg/dL (ref 70–99)

## 2022-01-04 MED ORDER — PREDNISONE 20 MG PO TABS: 40.0000 mg | ORAL_TABLET | Freq: Every day | ORAL | 0 refills | Status: AC

## 2022-01-04 MED ORDER — MONTELUKAST SODIUM 10 MG PO TABS: 10.0000 mg | ORAL_TABLET | Freq: Every day | ORAL | 2 refills | Status: DC

## 2022-01-04 NOTE — TOC Transition Note (Signed)
Transition of Care [facility]) - CM/SW Discharge Note   Patient Details  Name: Paul [name] MRN: [id number] Date of Birth: Dec 23, 1946  [facility]) CM/SW Contact:  Dessa Phi, RN Phone Number: 01/04/2022, 10:14 AM   Clinical Narrative: Home 02 requirements have increased to continuous-[employer] will provide dme to home base on home 02 order. No further CM needs.      Final next level of care: Home/Self Care Barriers to Discharge: No Barriers Identified   Patient Goals and CMS Choice Patient states their goals for this hospitalization and ongoing recovery are:: go home CMS Medicare.gov Compare Post Acute Care list provided to:: Patient Represenative (must comment) ([name] spouse [phone]) Choice offered to / list presented to : Spouse  Discharge Placement                       Discharge Plan and Services   Discharge Planning Services: CM Consult Post Acute Care Choice:  (home)          DME Arranged: Oxygen DME Agency: [facility] Date DME Agency Contacted: 01/04/22 Time DME Agency Contacted: [phone] Representative spoke with at DME Agency: [place] ([place]) Interventions     Readmission Risk Interventions Readmission Risk Prevention Plan 01/01/2022 01/15/2021 04/05/2020  Transportation Screening Complete Complete Complete  PCP or Specialist Appt within 3-5 Days Complete - -  Not Complete comments - - -  [place] or Home Care Consult Complete - -  Social Work Consult for [place] Planning/Counseling Complete - -  Palliative Care Screening Complete - -  Medication Review Press photographer) Complete Complete Complete  PCP or Specialist appointment within 3-5 days of discharge - Complete Complete  HRI or [place] - Complete Complete  SW Recovery Care/Counseling Consult - Complete Complete  Palliative Care Screening - Not Applicable Complete  [place] - Not Applicable Not Applicable  Some  recent data might be hidden

## 2022-01-04 NOTE — Progress Notes (Signed)
SATURATION QUALIFICATIONS: (This note is used to comply with regulatory documentation for home oxygen)  Patient Saturations on Room Air at Rest = 92%  Patient Saturations on Room Air while Ambulating = 85%  Patient Saturations on 3 Liters of oxygen while Ambulating = 92%  Please briefly explain why patient needs home oxygen:

## 2022-01-04 NOTE — Care Management Important Message (Signed)
Important Message  Patient Details IM Letter placed in Patients room. Name: Paul [name] MRN: [id number] Date of Birth: 02/07/1946   Medicare Important Message Given:  Yes     Kerin Salen 01/04/2022, 10:58 AM

## 2022-01-04 NOTE — Progress Notes (Signed)
Went over discharge papers with patient.  All questions answered. All home o2 orders in.  Son brought portable 02 tank for transport home.  Pt wheeled out via Therapist, sports.

## 2022-01-04 NOTE — Discharge Summary (Signed)
Physician Discharge Summary  DONTARIO EVETTS LZJ:[id number] DOB: 02-11-1946 DOA: 12/27/2021  PCP: [name], MD  Admit date: 12/27/2021 Discharge date: 01/04/2022  Admitted From: Home  Discharge disposition: Home   Recommendations for Outpatient Follow-Up:   Follow up with your primary care provider in one week.  Check CBC, BMP, magnesium in the next visit Follow-up with pulmonary as scheduled on 01/10/2022   Discharge Diagnosis:   Principal Problem:   COPD with acute exacerbation ([place]) Active Problems:   Former cigarette smoker   Coronary artery calcification   HLD (hyperlipidemia)   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[place])   Essential hypertension   Age-related osteoporosis with healing pathological fracture, chronic back pain   Chronic diastolic CHF (congestive heart failure) (HCC)   GAD (generalized anxiety disorder)   Chronic respiratory failure with hypoxia (HCC)   Paroxysmal atrial fibrillation (H[facility])   GERD (gastroesophageal reflux disease) / GI protection on antiplatelet therapy   COPD exacerbation ([place])  Discharge Condition: Improved.  Diet recommendation: Low sodium, heart healthy.  Carbohydrate-modified.    Wound care: None.  Code status: Full.   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 fib s/p watchman atrial appendage device, type II DM, GERD, HLD, HTN, presented to the hospital on 12/27/2020 for progressive dyspnea cough and shortness of breath.  Patient was in respiratory distress and was initially started on BiPAP.  Subsequently was transferred to stepdown unit.  Viral panel showed RSV.   Hospital Course:   Following conditions were addressed during hospitalization  as listed below,  COPD with acute exacerbation Likely secondary to RSV infection.  Patient is steroid and oxygen dependant at baseline.  Patient has been seen by pulmonary during hospitalization.  Patient received Solu-Medrol Q 60 mg q8h. this has been changed to prednisone 40 daily discharged on till pulmonary follow-up..  Patient has completed a 5-day course of Zithromax.    on 2 L of oxygen by nasal cannula.  Patient follows up with Dr. Melvyn Novas, pulmonary as outpatient.  Patient will follow up with pulmonary clinic on 01/10/2022.  Communicated with pulmonary prior to discharge.  Patient will resume his home inhalers on discharge     Former cigarette smoker Encouraged to remain abstinent.     Coronary artery calcification    On Lipitor, Zebeta and Plavix as outpatient.  Will resume on discharge.     Hyperlipidemia Continue Lipitor     DM type 2 with diabetic mixed hyperlipidemia  Not on oral hypoglycemics at home.  Diabetic diet.     Essential hypertension Continue bisoprolol.     Age-related osteoporosis with healing pathological fracture, chronic back pain Continue muscle relaxant from home, vitamin D.    Chronic diastolic congestive heart failure Continue Lasix orally from home.     Generalized anxiety disorder     Controlled.  Continue Zoloft daily           Paroxysmal atrial fibrillation        Continue Zebeta     Gastroesophageal reflux disease Continue Protonix daily  Disposition.  At this time, patient is stable for disposition home with outpatient PCP and pulmonary follow-up.  Medical Consultants:   PCCM  Procedures:    None Subjective:  Today, patient examined at bedside.  Feels much better today.  Denies any wheezing cough pain.  Wishes to go home.  His baseline oxygen requirement.  Discharge Exam:   Vitals:   01/04/22 0759 01/04/22 0803  BP:    Pulse:    Resp:    Temp:    SpO2: 99% 99%   Vitals:   01/04/22 0356 01/04/22 0500 01/04/22 0759  01/04/22 0803  BP: 114/64     Pulse: 75     Resp: 18     Temp: 98 F (36.7 C)     TempSrc: Oral     SpO2: 99%  99% 99%  Weight:  66.5 kg    Height:       General: Alert awake, not in obvious distress, on nasal cannula oxygen HENT: pupils equally reacting to light,  No scleral pallor or icterus noted. Oral mucosa is moist.  Chest:    Diminished breath sounds bilaterally.  Minimal wheezes noted. CVS: S1 &S2 heard. No murmur.  Regular rate and rhythm. Abdomen: Soft, nontender, nondistended.  Bowel sounds are heard.   Extremities: No cyanosis, clubbing or edema.  Peripheral pulses are palpable.  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 Psych: Alert, awake and oriented, normal mood CNS:  No cranial nerve deficits.  Power equal in all extremities.   Skin: Warm and dry.  No rashes noted.  The results of significant diagnostics from this hospitalization (including imaging, microbiology, ancillary and laboratory) are listed below for reference.     Diagnostic Studies:   DG Chest 2 View  Result Date: 12/28/2021 CLINICAL DATA:  COPD and shortness of breath. EXAM: CHEST - 2 VIEW COMPARISON:  12/27/2021 FINDINGS: Lungs are hyperexpanded. The lungs are clear without focal pneumonia, edema, pneumothorax or pleural effusion. The cardiopericardial silhouette is within normal limits for size. Telemetry leads overlie the chest. IMPRESSION: Emphysema without acute cardiopulmonary findings. Electronically Signed   By: Misty Stanley M.D.   On: 12/28/2021 06:35   DG Chest 2 View  Result Date: 12/27/2021 CLINICAL DATA:  SOB EXAM: CHEST - 2 VIEW COMPARISON:  08/02/2021 FINDINGS: No consolidation. No visible pleural effusions or pneumothorax. Cardiomediastinal silhouette is within normal limits and similar to prior. L1 compression fracture with approximately 50% height loss and kyphoplasty, which is new since 08/02/2021 but similar in appearance to intraoperative fluoroscopic imaging from kyphoplasty on 11/28/2021.  Multiple additional compression fractures in thoracic spine appears similar. IMPRESSION: No evidence of acute cardiopulmonary disease. Electronically Signed   By: Margaretha Sheffield M.D.   On: 12/27/2021 09:05     Labs:   Basic Metabolic Panel: Recent Labs  Lab 12/30/21 0235 12/31/21 0247 01/01/22 0422 01/02/22 0429 01/03/22 0408  NA 140 144 142 141 140  K 3.8 4.0 3.9 4.0 4.7  CL 102 104 104 99 97*  CO2 30 33* 33* 35* 36*  GLUCOSE 135* 141* 139* 143* 147*  BUN 32* 32* 35* 31* 32*  CREATININE 0.89 0.82 0.98 0.91 0.87  CALCIUM 8.2* 8.3* 8.0* 8.1* 8.2*   GFR Estimated Creatinine Clearance: 65.2 mL/min (by C-G formula based on SCr of 0.87 mg/dL). Liver Function Tests: No results for input(s): AST, ALT, ALKPHOS, BILITOT, PROT, ALBUMIN in the last 168 hours. No results for input(s): LIPASE, AMYLASE in the last 168 hours. No results for input(s): AMMONIA in the last 168 hours. Coagulation profile No results for input(s): INR, PROTIME in the last 168 hours.  CBC: Recent Labs  Lab 12/30/21 0235 12/31/21 0247 01/01/22 0422 01/02/22 0429  01/03/22 0408  WBC 9.6 9.8 9.8 8.2 8.8  HGB 10.6* 11.0* 11.5* 11.7* 11.5*  HCT 35.2* 36.3* 38.4* 38.2* 37.2*  MCV 84.0 83.8 83.1 83.2 81.9  PLT 218 244 261 262 230   Cardiac Enzymes: No results for input(s): CKTOTAL, CKMB, CKMBINDEX, TROPONINI in the last 168 hours. BNP: Invalid input(s): POCBNP CBG: Recent Labs  Lab 01/03/22 0724 01/03/22 1116 01/03/22 1614 01/03/22 2000 01/04/22 0744  GLUCAP 126* 169* 168* 133* 76   D-Dimer No results for input(s): DDIMER in the last 72 hours. Hgb A1c No results for input(s): HGBA1C in the last 72 hours. Lipid Profile No results for input(s): CHOL, HDL, LDLCALC, TRIG, CHOLHDL, LDLDIRECT in the last 72 hours. Thyroid function studies No results for input(s): TSH, T4TOTAL, T3FREE, THYROIDAB in the last 72 hours.  Invalid input(s): FREET3 Anemia work up No results for input(s): VITAMINB12,  FOLATE, FERRITIN, TIBC, IRON, RETICCTPCT in the last 72 hours. Microbiology Recent Results (from the past 240 hour(s))  Resp Panel by RT-PCR (Flu A&B, Covid) Nasopharyngeal Swab     Status: None   Collection Time: 12/27/21  9:24 AM   Specimen: Nasopharyngeal Swab; Nasopharyngeal(NP) swabs in vial transport medium  Result Value Ref Range Status   SARS Coronavirus 2 by RT PCR NEGATIVE NEGATIVE Final    Comment: (NOTE) SARS-CoV-2 target nucleic acids are NOT DETECTED.  The SARS-CoV-2 RNA is generally detectable in upper respiratory specimens during the acute phase of infection. The lowest concentration of SARS-CoV-2 viral copies this assay can detect is 138 copies/mL. A negative result does not preclude SARS-Cov-2 infection and should not be used as the sole basis for treatment or other patient management decisions. A negative result may occur with  improper specimen collection/handling, submission of specimen other than nasopharyngeal swab, presence of viral mutation(s) within the areas targeted by this assay, and inadequate number of viral copies(<138 copies/mL). A negative result must be combined with clinical observations, patient history, and epidemiological information. The expected result is Negative.  Fact Sheet for Patients:  EntrepreneurPulse.com.au  Fact Sheet for Healthcare Providers:  IncredibleEmployment.be  This test is no t yet approved or cleared by the [place] FDA and  has been authorized for detection and/or diagnosis of SARS-CoV-2 by FDA under an Emergency Use Authorization (EUA). This EUA will remain  in effect (meaning this test can be used) for the duration of the COVID-19 declaration under Section 564(b)(1) of the Act, 21 U.S.C.section 360bbb-3(b)(1), unless the authorization is terminated  or revoked sooner.       Influenza A by PCR NEGATIVE NEGATIVE Final   Influenza B by PCR NEGATIVE NEGATIVE Final    Comment:  (NOTE) The Xpert Xpress SARS-CoV-2/FLU/RSV plus assay is intended as an aid in the diagnosis of influenza from Nasopharyngeal swab specimens and should not be used as a sole basis for treatment. Nasal washings and aspirates are unacceptable for Xpert Xpress SARS-CoV-2/FLU/RSV testing.  Fact Sheet for Patients: EntrepreneurPulse.com.au  Fact Sheet for Healthcare Providers: IncredibleEmployment.be  This test is not yet approved or cleared by the [place] FDA and has been authorized for detection and/or diagnosis of SARS-CoV-2 by FDA under an Emergency Use Authorization (EUA). This EUA will remain in effect (meaning this test can be used) for the duration of the COVID-19 declaration under Section 564(b)(1) of the Act, 21 U.S.C. section 360bbb-3(b)(1), unless the authorization is terminated or revoked.  Performed at [facility], [place] [address] [place],  [address]   Respiratory (~20 pathogens) panel  by PCR     Status: Abnormal   Collection Time: 12/28/21  9:57 PM   Specimen: Nasopharyngeal Swab; Respiratory  Result Value Ref Range Status   Adenovirus NOT DETECTED NOT DETECTED Final   Coronavirus 229E NOT DETECTED NOT DETECTED Final    Comment: (NOTE) The Coronavirus on the Respiratory Panel, DOES NOT test for the novel  Coronavirus (2019 nCoV)    Coronavirus HKU1 NOT DETECTED NOT DETECTED Final   Coronavirus NL63 NOT DETECTED NOT DETECTED Final   Coronavirus OC43 NOT DETECTED NOT DETECTED Final   Metapneumovirus NOT DETECTED NOT DETECTED Final   Rhinovirus / Enterovirus NOT DETECTED NOT DETECTED Final   Influenza A NOT DETECTED NOT DETECTED Final   Influenza B NOT DETECTED NOT DETECTED Final   Parainfluenza Virus 1 NOT DETECTED NOT DETECTED Final   Parainfluenza Virus 2 NOT DETECTED NOT DETECTED Final   Parainfluenza Virus 3 NOT DETECTED NOT DETECTED Final   Parainfluenza Virus 4 NOT DETECTED NOT DETECTED Final    Respiratory Syncytial Virus DETECTED (A) NOT DETECTED Final   Bordetella pertussis NOT DETECTED NOT DETECTED Final   Bordetella Parapertussis NOT DETECTED NOT DETECTED Final   Chlamydophila pneumoniae NOT DETECTED NOT DETECTED Final   Mycoplasma pneumoniae NOT DETECTED NOT DETECTED Final    Comment: Performed at [facility], [place]. [address], [place], [place] [address]  MRSA Next Gen by PCR, Nasal     Status: None   Collection Time: 12/28/21 10:47 PM   Specimen: Nasal Mucosa; Nasal Swab  Result Value Ref Range Status   MRSA by PCR Next Gen NOT DETECTED NOT DETECTED Final    Comment: (NOTE) The GeneXpert MRSA Assay (FDA approved for NASAL specimens only), is one component of a comprehensive MRSA colonization surveillance program. It is not intended to diagnose MRSA infection nor to guide or monitor treatment for MRSA infections. Test performance is not FDA approved in patients less than 74 years old. Performed at [facility], [place] [address] [place], [place] [address]      Discharge Instructions:   Discharge Instructions     Call MD for:  difficulty breathing, headache or visual disturbances   Complete by: As directed    Call MD for:  temperature >100.4   Complete by: As directed    Diet - low sodium heart healthy   Complete by: As directed    Diet Carb Modified   Complete by: As directed    Discharge instructions   Complete by: As directed    Follow-up with your primary care physician in 2 weeks.  Follow-up with pulmonary clinic on 01/10/2022.  Continue to take medications as prescribed.  No overexertion.  Continue oxygen at home.  Seek medical attention for worsening symptoms. Your dose of steroid has changed until you see your pulmonary doctor   Increase activity slowly   Complete by: As directed       Allergies as of 01/04/2022       Reactions   Daliresp [roflumilast] Diarrhea, Nausea And Vomiting   Levaquin [levofloxacin] Palpitations    Made the B/P fluctuate and heart raced   Alendronate Anxiety, Other (See Comments)   Jittery/nervous        Medication List     TAKE these medications    acetaminophen 325 MG tablet Commonly known as: TYLENOL Take 650 mg by mouth every 6 (six) hours as needed for mild pain or fever.   atorvastatin 40 MG tablet Commonly known as: LIPITOR TAKE 1  TABLET BY MOUTH ONCE DAILY AFTER SUPPER What changed: See the new instructions.   bisoprolol 5 MG tablet Commonly known as: ZEBETA Take 1 tablet (5 mg total) by mouth daily.   Breztri Aerosphere 160-9-4.8 MCG/ACT Aero Generic drug: Budeson-Glycopyrrol-Formoterol Inhale 2 puffs into the lungs 2 (two) times daily. What changed: Another medication with the same name was removed. Continue taking this medication, and follow the directions you see here.   calcium carbonate 600 MG Tabs tablet Commonly known as: OS-CAL Take 600 mg by mouth daily.   clopidogrel 75 MG tablet Commonly known as: PLAVIX Take 1 tablet (75 mg total) by mouth daily.   denosumab 60 MG/ML Sosy injection Commonly known as: PROLIA Inject 60 mg into the skin every 6 (six) months.   diltiazem 60 MG tablet Commonly known as: Cardizem Take 1 tablet (60 mg total) by mouth 4 (four) times daily as needed.   ferrous sulfate 325 (65 FE) MG tablet Take 325 mg by mouth 2 (two) times daily with a meal.   furosemide 40 MG tablet Commonly known as: LASIX Take 1 tablet (40 mg total) by mouth daily.   guaiFENesin 600 MG 12 hr tablet Commonly known as: MUCINEX Take 600 mg by mouth 2 (two) times daily.   loratadine 10 MG tablet Commonly known as: CLARITIN Take 10 mg by mouth in the morning.   montelukast 10 MG tablet Commonly known as: SINGULAIR Take 1 tablet (10 mg total) by mouth at bedtime.   multivitamin with minerals Tabs tablet Take 1 tablet by mouth daily.   NICOTINAMIDE PO Take 1 tablet by mouth in the morning and at bedtime.   OXYGEN Inhale 2-2.5  L/min into the lungs See admin instructions. Inhale 2 L/min of oxygen into the lungs at bedtime and when ambulatory   pantoprazole 40 MG tablet Commonly known as: PROTONIX Take 1 tablet by mouth once daily   predniSONE 20 MG tablet Commonly known as: DELTASONE Take 2 tablets (40 mg total) by mouth daily with breakfast. What changed:  medication strength how much to take   ProAir HFA 108 (90 Base) MCG/ACT inhaler Generic drug: albuterol Inhale 2 puffs into the lungs every 6 (six) hours as needed for wheezing or shortness of breath.   albuterol (2.5 MG/3ML) 0.083% nebulizer solution Commonly known as: PROVENTIL Take 3 mLs (2.5 mg total) by nebulization in the morning and at bedtime.   sertraline 50 MG tablet Commonly known as: ZOLOFT Take 1 tablet (50 mg total) by mouth daily. What changed: when to take this   tiZANidine 2 MG tablet Commonly known as: ZANAFLEX Take 1 tablet (2 mg total) by mouth every 6 (six) hours as needed for muscle spasms.   vitamin C 1000 MG tablet Take 1,000 mg by mouth daily.   Vitamin D 50 MCG (2000 UT) Caps Take 2,000 Units by mouth daily.               Durable Medical Equipment  (From admission, onward)           Start     Ordered   01/04/22 1020  For home use only DME oxygen  Once       Question Answer Comment  Length of Need Lifetime   Mode or (Route) Nasal cannula   Liters per Minute 2   Frequency Continuous (stationary and portable oxygen unit needed)   Oxygen conserving device Yes   Oxygen delivery system Gas      01/04/22 1019  Follow-up Information     Parrett, Fonnie Mu, NP Follow up on 01/10/2022.   Specialty: Pulmonary Disease Why: Appt at 3PM.  Please arrive at 2:45 for check in., Hospital Follow Up / Pulmonary Contact information: [place] [address] [phone]         [name], MD Follow up in 1 week(s).   Specialties: Internal Medicine, Interventional  Cardiology, Radiology, Anesthesiology Contact information: [address] [place] [address] [phone]         Jerline Pain, MD .   Specialty: Cardiology Contact information: [phone] [place] [address] [phone]         Vickie Epley, MD .   Specialties: Cardiology, Radiology Contact information: [place] [address] [phone]         [facility] Follow up.   Why: increase oxygen requirements Contact information: [address] [place] [address] [phone]                  Time coordinating discharge: 39 minutes  Signed:  Gopal Malter  [facility] 01/04/2022, 11:19 AM

## 2022-01-04 NOTE — Progress Notes (Signed)
NAME:  Paul [name], MRN:  [id number], DOB:  1946-06-29, LOS: 8 ADMISSION DATE:  12/27/2021, CONSULTATION DATE: 1/62023 REFERRING MD:  Dr. Algis Liming, [facility] COMPLAINT:  Short of breath   History of Present Illness:  76 y/o male former smoker presented to [facility] ER with dyspnea, productive cough and increasing oxygen needs.  Associated with subjective fever.  Admitted for COPD exacerbation and tested positive for RSV.  PCCM consulted to assist with respiratory management.  He is followed by Dr. Melvyn Novas in the pulmonary office.  He is enrolled with palliative care services through [facility].  Pertinent  Medical History  Anemia, A fib s/p Watchman procedure, COVID 10 January 2021, DM type 2, Anxiety, GERD, HLD, HTN, Osteoporosis, COPD with chronic respiratory failure on 2 liters with exertion and sleep  Studies:  PFT 05/29/16 >> FEV1 0.83 (31%), FEV1% 40, TLC 6.56 (107%), DLCO 60% A1AT 06/15/19 >> 178, MM Echo 07/09/21 >> EF 60 to 65%, grade 1 DD Cardiac CT 07/18/21 >> moderate centrilobular emphysema, stable 4 mm nodule RUL  Interim History / Subjective:  Pt reports feeling well, feels ok to return home  On baseline O2  Denies SOB   Objective   Blood pressure 114/64, pulse 75, temperature 98 F (36.7 C), temperature source Oral, resp. rate 18, height 5\' 6"  (1.676 m), weight 66.5 kg, SpO2 99 %.        Intake/Output Summary (Last 24 hours) at 01/04/2022 0854 Last data filed at 01/03/2022 2200 Gross per 24 hour  Intake 240 ml  Output --  Net 240 ml   Filed Weights   12/31/21 0500 01/02/22 0621 01/04/22 0500  Weight: 65.1 kg 66.1 kg 66.5 kg    Examination: General: adult male lying in bed in NAD HEENT: MM pink/moist, [place] O2, anicteric  Neuro: AAOx4, speech clear, MAE / non-focal 3 CV: s1s2 S1S2 RRR, no m/r/g PULM: non-labored at rest, lungs bilaterally with improved wheezing compared to 1/12 exam, good air entry bilaterally  GI: soft, bsx4 active  Extremities: warm/dry, no  edema, 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 Completed abx 1/9.   -anticipate discharge 1/13 per primary team  -follow up arranged, see discharge section  -at baseline O2 2L  -keep on prednisone 40 mg until follow up with Pulmonary, then begin taper back to 5mg  baselin e -return to home Breztri, albuterol at discharge  -pulmonary hygiene -IS, mobilize  CAD, HTN, HLD, DM type 2, Osteoporosis, Chronic diastolic CHF, Anxiety, PAF, GERD. -per primary   Labs    CMP Latest Ref Rng & Units 01/03/2022 01/02/2022 01/01/2022  Glucose 70 - 99 mg/dL 147(H) 143(H) 139(H)  BUN 8 - 23 mg/dL 32(H) 31(H) 35(H)  Creatinine 0.61 - 1.24 mg/dL 0.87 0.91 0.98  Sodium 135 - 145 mmol/L 140 141 142  Potassium 3.5 - 5.1 mmol/L 4.7 4.0 3.9  Chloride 98 - 111 mmol/L 97(L) 99 104  CO2 22 - 32 mmol/L 36(H) 35(H) 33(H)  Calcium 8.9 - 10.3 mg/dL 8.2(L) 8.1(L) 8.0(L)  Total Protein 6.0 - 8.5 g/dL - - -  Total Bilirubin 0.0 - 1.2 mg/dL - - -  Alkaline Phos 44 - 121 IU/L - - -  AST 0 - 40 IU/L - - -  ALT 0 - 44 IU/L - - -   CBC Latest Ref Rng & Units 01/03/2022 01/02/2022 01/01/2022  WBC 4.0 - 10.5 K/uL 8.8 8.2 9.8  Hemoglobin 13.0 - 17.0 g/dL 11.5(L) 11.7(L) 11.5(L)  Hematocrit 39.0 - 52.0 % 37.2(L) 38.2(L) 38.4(L)  Platelets 150 - 400 K/uL 230 262 261    ABG    Component Value Date/Time   PHART 7.533 (H) 03/19/2019 1434   PCO2ART 37.9 03/19/2019 1434   PO2ART 91.0 03/19/2019 1434   HCO3 38.6 (H) 03/27/2020 0036   TCO2 40 (H) 03/27/2020 0036   O2SAT 100.0 03/27/2020 0036    CBG (last 3)  Recent Labs    01/03/22 1614 01/03/22 2000 01/04/22 0744  GLUCAP 168* 133* 66   Signature:  Noe Gens, MSN, APRN, NP-C, AGACNP-BC [facility] 01/04/2022, 8:54 AM   Please see [email or web address] for pager details.

## 2022-01-10 ENCOUNTER — Encounter: Payer: Self-pay | Admitting: Adult Health

## 2022-01-10 ENCOUNTER — Other Ambulatory Visit: Payer: Self-pay

## 2022-01-10 ENCOUNTER — Ambulatory Visit (INDEPENDENT_AMBULATORY_CARE_PROVIDER_SITE_OTHER): Payer: Medicare Other | Admitting: Adult Health

## 2022-01-10 DIAGNOSIS — J441 Chronic obstructive pulmonary disease with (acute) exacerbation: Secondary | ICD-10-CM | POA: Diagnosis not present

## 2022-01-10 DIAGNOSIS — J9611 Chronic respiratory failure with hypoxia: Secondary | ICD-10-CM

## 2022-01-10 DIAGNOSIS — I5032 Chronic diastolic (congestive) heart failure: Secondary | ICD-10-CM | POA: Diagnosis not present

## 2022-01-10 MED ORDER — PREDNISONE 10 MG PO TABS: ORAL_TABLET | ORAL | 0 refills | Status: DC

## 2022-01-10 NOTE — Patient Instructions (Addendum)
Prednisone taper as discussed  Continue on BREZTRI 2 puffs Twice daily, rinse after use.  Mucinex Twice daily  As needed  Cough/congestion  Flutter valve Three times a day  .  Albuterol Neb or inhaler as needed  Wear Oxygen 2l/m  with activity and At bedtime   to maintain O2 sats >88-90%.  Activity as tolerated.  Please contact office for sooner follow up if symptoms do not improve or worsen or seek emergency care  Follow up with Dr. Melvyn Novas  Or Filomeno Cromley NP in 4-6  weeks and As needed

## 2022-01-10 NOTE — ED Provider Notes (Signed)
[name]LO[facility] C[facility] UROLOGY Provider Note   CSN: [id number] Arrival date & time: 12/27/21  0830     History  Chief Complaint  Patient presents with   Shortness of Breath    Paul [name] is a 76 y.o. male.  HPI     76 year old gentleman with history of COPD, severe, on home oxygen, presenting to ED with about 1 day of increasing shortness of breath and increasing mucus production/cough.  Reports fever at home but has been afebrile here.  Stable history of paroxysmal atrial fibrillation status post watchman atrial appendage device placed 08/2021 and patient denies any nausea, vomiting, fevers, chills.   Home Medications Prior to Admission medications   Medication Sig Start Date End Date Taking? Authorizing Provider  acetaminophen (TYLENOL) 325 MG tablet Take 650 mg by mouth every 6 (six) hours as needed for mild pain or fever.   Yes [provider]  albuterol (PROAIR HFA) 108 (90 Base) MCG/ACT inhaler Inhale 2 puffs into the lungs every 6 (six) hours as needed for wheezing or shortness of breath.    Yes [provider]  albuterol (PROVENTIL) (2.5 MG/3ML) 0.083% nebulizer solution Take 3 mLs (2.5 mg total) by nebulization in the morning and at bedtime. 05/09/20  Yes Parrett, Tammy S, NP  Ascorbic Acid (VITAMIN C) 1000 MG tablet Take 1,000 mg by mouth daily.   Yes [provider]  atorvastatin (LIPITOR) 40 MG tablet TAKE 1 TABLET BY MOUTH ONCE DAILY AFTER SUPPER Patient taking differently: Take 40 mg by mouth every evening. 11/29/21  Yes Cox, Kirsten, MD  bisoprolol (ZEBETA) 5 MG tablet Take 1 tablet (5 mg total) by mouth daily. 01/19/21  Yes Jerline Pain, MD  Budeson-Glycopyrrol-Formoterol (BREZTRI AEROSPHERE) 160-9-4.8 MCG/ACT AERO Inhale 2 puffs into the lungs 2 (two) times daily. 03/26/21  Yes Cox, Kirsten, MD  calcium carbonate (OS-CAL) 600 MG TABS tablet Take 600 mg by mouth daily.   Yes [provider]  Cholecalciferol  (VITAMIN D) 50 MCG (2000 UT) CAPS Take 2,000 Units by mouth daily.   Yes [provider]  clopidogrel (PLAVIX) 75 MG tablet Take 1 tablet (75 mg total) by mouth daily. 09/13/21 03/12/22 Yes Eileen Stanford, PA-C  denosumab (PROLIA) 60 MG/ML SOSY injection Inject 60 mg into the skin every 6 (six) months.   Yes [provider]  ferrous sulfate 325 (65 FE) MG tablet Take 325 mg by mouth 2 (two) times daily with a meal.   Yes [provider]  furosemide (LASIX) 40 MG tablet Take 1 tablet (40 mg total) by mouth daily. 01/19/21  Yes Jerline Pain, MD  guaiFENesin (MUCINEX) 600 MG 12 hr tablet Take 600 mg by mouth 2 (two) times daily.   Yes [provider]  loratadine (CLARITIN) 10 MG tablet Take 10 mg by mouth in the morning.   Yes [provider]  Multiple Vitamin (MULTIVITAMIN WITH MINERALS) TABS tablet Take 1 tablet by mouth daily.   Yes [provider]  Niacinamide-Zn-Cu-Methfo-Se-Cr (NICOTINAMIDE PO) Take 1 tablet by mouth in the morning and at bedtime.   Yes [provider]  OXYGEN Inhale 2-2.5 L/min into the lungs See admin instructions. Inhale 2 L/min of oxygen into the lungs at bedtime and when ambulatory   Yes [provider]  pantoprazole (PROTONIX) 40 MG tablet Take 1 tablet by mouth once daily Patient taking differently: Take 40 mg by mouth daily. 12/12/21  Yes Jerline Pain, MD  diltiazem (  CARDIZEM) 60 MG tablet Take 1 tablet (60 mg total) by mouth 4 (four) times daily as needed. 01/19/21   Jerline Pain, MD  montelukast (SINGULAIR) 10 MG tablet Take 1 tablet (10 mg total) by mouth at bedtime. 01/04/22   Pokhrel, Corrie Mckusick, MD  predniSONE (DELTASONE) 10 MG tablet 4 tabs for 3 days, then 3 tabs for 3 days, 2 tabs for 3 days, then 1 tab for 3 days, then hold at 5mg  daily 01/10/22   Parrett, Fonnie Mu, NP  predniSONE (DELTASONE) 20 MG tablet Take 2 tablets (40 mg total) by mouth daily with breakfast. 01/04/22 02/03/22  Pokhrel,  Corrie Mckusick, MD  sertraline (ZOLOFT) 50 MG tablet Take 1 tablet (50 mg total) by mouth daily. 12/31/21   Cox, Elnita Maxwell, MD  tiZANidine (ZANAFLEX) 2 MG tablet Take 1 tablet (2 mg total) by mouth every 6 (six) hours as needed for muscle spasms. 11/12/21   [name], MD      Allergies    Daliresp [roflumilast], Levaquin [levofloxacin], and Alendronate    Review of Systems   Review of Systems  HENT:  Positive for congestion.   Respiratory:  Positive for cough and shortness of breath.    Physical Exam Updated Vital Signs BP 114/64 (BP Location: Right Arm)    Pulse 75    Temp 98 F (36.7 C) (Oral)    Resp 18    Ht 5\' 6"  (1.676 m)    Wt 66.5 kg    SpO2 99%    BMI 23.66 kg/m  Physical Exam Vitals and nursing note reviewed.  Constitutional:      Appearance: He is well-developed.  HENT:     Head: Atraumatic.  Neck:     Vascular: No JVD.  Cardiovascular:     Rate and Rhythm: Normal rate.  Pulmonary:     Effort: Pulmonary effort is normal. Tachypnea present.     Breath sounds: Decreased breath sounds and wheezing present.  Musculoskeletal:     Cervical back: Neck supple.     Right lower leg: No edema.     Left lower leg: No edema.  Skin:    General: Skin is warm.  Neurological:     Mental Status: He is alert and oriented to person, place, and time.    ED Results / Procedures / Treatments   Labs (all labs ordered are listed, but only abnormal results are displayed) Labs Reviewed  RESPIRATORY PANEL BY PCR - Abnormal; Notable for the following components:      Result Value   Respiratory Syncytial Virus DETECTED (*)    All other components within normal limits  BASIC METABOLIC PANEL - Abnormal; Notable for the following components:   Glucose, Bld 123 (*)    Calcium 8.8 (*)    All other components within normal limits  CBC WITH DIFFERENTIAL/PLATELET - Abnormal; Notable for the following components:   WBC 10.6 (*)    MCH 25.0 (*)    RDW 15.9 (*)    Neutro Abs 9.1 (*)    Lymphs Abs  0.6 (*)    All other components within normal limits  CBC - Abnormal; Notable for the following components:   Hemoglobin 12.1 (*)    MCH 24.9 (*)    RDW 16.0 (*)    All other components within normal limits  HEMOGLOBIN A1C - Abnormal; Notable for the following components:   Hgb A1c MFr Bld 6.5 (*)    All other components within normal limits  CBC - Abnormal; Notable  for the following components:   Hemoglobin 11.5 (*)    HCT 37.5 (*)    MCH 25.2 (*)    RDW 16.1 (*)    All other components within normal limits  BASIC METABOLIC PANEL - Abnormal; Notable for the following components:   Glucose, Bld 108 (*)    Calcium 7.4 (*)    All other components within normal limits  CBC - Abnormal; Notable for the following components:   Hemoglobin 11.3 (*)    HCT 37.2 (*)    MCH 25.1 (*)    RDW 16.1 (*)    All other components within normal limits  BASIC METABOLIC PANEL - Abnormal; Notable for the following components:   Glucose, Bld 150 (*)    BUN 28 (*)    Calcium 8.3 (*)    All other components within normal limits  GLUCOSE, CAPILLARY - Abnormal; Notable for the following components:   Glucose-Capillary 127 (*)    All other components within normal limits  GLUCOSE, CAPILLARY - Abnormal; Notable for the following components:   Glucose-Capillary 129 (*)    All other components within normal limits  GLUCOSE, CAPILLARY - Abnormal; Notable for the following components:   Glucose-Capillary 129 (*)    All other components within normal limits  CBC - Abnormal; Notable for the following components:   RBC 4.19 (*)    Hemoglobin 10.6 (*)    HCT 35.2 (*)    MCH 25.3 (*)    RDW 16.2 (*)    All other components within normal limits  BASIC METABOLIC PANEL - Abnormal; Notable for the following components:   Glucose, Bld 135 (*)    BUN 32 (*)    Calcium 8.2 (*)    All other components within normal limits  GLUCOSE, CAPILLARY - Abnormal; Notable for the following components:   Glucose-Capillary  153 (*)    All other components within normal limits  GLUCOSE, CAPILLARY - Abnormal; Notable for the following components:   Glucose-Capillary 131 (*)    All other components within normal limits  GLUCOSE, CAPILLARY - Abnormal; Notable for the following components:   Glucose-Capillary 128 (*)    All other components within normal limits  GLUCOSE, CAPILLARY - Abnormal; Notable for the following components:   Glucose-Capillary 139 (*)    All other components within normal limits  CBC - Abnormal; Notable for the following components:   Hemoglobin 11.0 (*)    HCT 36.3 (*)    MCH 25.4 (*)    RDW 16.1 (*)    All other components within normal limits  BASIC METABOLIC PANEL - Abnormal; Notable for the following components:   CO2 33 (*)    Glucose, Bld 141 (*)    BUN 32 (*)    Calcium 8.3 (*)    All other components within normal limits  GLUCOSE, CAPILLARY - Abnormal; Notable for the following components:   Glucose-Capillary 122 (*)    All other components within normal limits  GLUCOSE, CAPILLARY - Abnormal; Notable for the following components:   Glucose-Capillary 115 (*)    All other components within normal limits  GLUCOSE, CAPILLARY - Abnormal; Notable for the following components:   Glucose-Capillary 149 (*)    All other components within normal limits  GLUCOSE, CAPILLARY - Abnormal; Notable for the following components:   Glucose-Capillary 136 (*)    All other components within normal limits  CBC - Abnormal; Notable for the following components:   Hemoglobin 11.5 (*)    HCT  38.4 (*)    MCH 24.9 (*)    MCHC 29.9 (*)    RDW 16.1 (*)    All other components within normal limits  BASIC METABOLIC PANEL - Abnormal; Notable for the following components:   CO2 33 (*)    Glucose, Bld 139 (*)    BUN 35 (*)    Calcium 8.0 (*)    All other components within normal limits  GLUCOSE, CAPILLARY - Abnormal; Notable for the following components:   Glucose-Capillary 108 (*)    All other  components within normal limits  GLUCOSE, CAPILLARY - Abnormal; Notable for the following components:   Glucose-Capillary 108 (*)    All other components within normal limits  GLUCOSE, CAPILLARY - Abnormal; Notable for the following components:   Glucose-Capillary 153 (*)    All other components within normal limits  GLUCOSE, CAPILLARY - Abnormal; Notable for the following components:   Glucose-Capillary 121 (*)    All other components within normal limits  CBC - Abnormal; Notable for the following components:   Hemoglobin 11.7 (*)    HCT 38.2 (*)    MCH 25.5 (*)    RDW 15.9 (*)    All other components within normal limits  BASIC METABOLIC PANEL - Abnormal; Notable for the following components:   CO2 35 (*)    Glucose, Bld 143 (*)    BUN 31 (*)    Calcium 8.1 (*)    All other components within normal limits  GLUCOSE, CAPILLARY - Abnormal; Notable for the following components:   Glucose-Capillary 133 (*)    All other components within normal limits  GLUCOSE, CAPILLARY - Abnormal; Notable for the following components:   Glucose-Capillary 117 (*)    All other components within normal limits  GLUCOSE, CAPILLARY - Abnormal; Notable for the following components:   Glucose-Capillary 133 (*)    All other components within normal limits  GLUCOSE, CAPILLARY - Abnormal; Notable for the following components:   Glucose-Capillary 133 (*)    All other components within normal limits  CBC - Abnormal; Notable for the following components:   Hemoglobin 11.5 (*)    HCT 37.2 (*)    MCH 25.3 (*)    All other components within normal limits  BASIC METABOLIC PANEL - Abnormal; Notable for the following components:   Chloride 97 (*)    CO2 36 (*)    Glucose, Bld 147 (*)    BUN 32 (*)    Calcium 8.2 (*)    All other components within normal limits  GLUCOSE, CAPILLARY - Abnormal; Notable for the following components:   Glucose-Capillary 142 (*)    All other components within normal limits   GLUCOSE, CAPILLARY - Abnormal; Notable for the following components:   Glucose-Capillary 126 (*)    All other components within normal limits  GLUCOSE, CAPILLARY - Abnormal; Notable for the following components:   Glucose-Capillary 169 (*)    All other components within normal limits  GLUCOSE, CAPILLARY - Abnormal; Notable for the following components:   Glucose-Capillary 168 (*)    All other components within normal limits  GLUCOSE, CAPILLARY - Abnormal; Notable for the following components:   Glucose-Capillary 133 (*)    All other components within normal limits  CBG MONITORING, ED - Abnormal; Notable for the following components:   Glucose-Capillary 155 (*)    All other components within normal limits  CBG MONITORING, ED - Abnormal; Notable for the following components:   Glucose-Capillary 106 (*)  All other components within normal limits  CBG MONITORING, ED - Abnormal; Notable for the following components:   Glucose-Capillary 193 (*)    All other components within normal limits  CBG MONITORING, ED - Abnormal; Notable for the following components:   Glucose-Capillary 132 (*)    All other components within normal limits  RESP PANEL BY RT-PCR (FLU A&B, COVID) ARPGX2  MRSA NEXT GEN BY PCR, NASAL  HIV ANTIBODY (ROUTINE TESTING W REFLEX)  BRAIN NATRIURETIC PEPTIDE  GLUCOSE, CAPILLARY  CBG MONITORING, ED    EKG EKG Interpretation  Date/Time:  Thursday December 27 2021 10:10:24 EST Ventricular Rate:  109 PR Interval:  136 QRS Duration: 84 QT Interval:  332 QTC Calculation: 447 R Axis:   72 Text Interpretation: Sinus tachycardia Consider right atrial enlargement Probable anteroseptal infarct, old Minimal ST depression, inferior leads When compared with ECG of 08/03/2021, No significant change was found Confirmed by Delora Fuel ([id number]) on 12/27/2021 11:29:09 PM  Radiology No results found.  Procedures .Critical Care Performed by: [name], MD Authorized by:  [name], MD   Critical care provider statement:    Critical care time (minutes):  40   Critical care was necessary to treat or prevent imminent or life-threatening deterioration of the following conditions:  Respiratory failure   Critical care was time spent personally by me on the following activities:  Development of treatment plan with patient or surrogate, discussions with consultants, evaluation of patient's response to treatment, examination of patient, ordering and review of laboratory studies, ordering and review of radiographic studies, ordering and performing treatments and interventions, pulse oximetry, re-evaluation of patient's condition and review of old charts    Medications Ordered in ED Medications  ipratropium (ATROVENT) nebulizer solution 0.5 mg (0.5 mg Nebulization Given 12/27/21 0947)  albuterol (PROVENTIL) (2.5 MG/3ML) 0.083% nebulizer solution 2.5 mg (2.5 mg Nebulization Given 12/27/21 0947)  doxycycline (VIBRAMYCIN) 100 mg in sodium chloride 0.9 % 250 mL IVPB (0 mg Intravenous Stopped 12/27/21 1423)  albuterol (PROVENTIL) (2.5 MG/3ML) 0.083% nebulizer solution 2.5 mg (2.5 mg Nebulization Given 12/27/21 1203)    ED Course/ Medical Decision Making/ A&P                           Medical Decision Making This patient presents to the ED with chief complaint(s) of shortness of breath, with increased oxygen requirement with pertinent past medical history of COPD, CAD which further complicates the presenting complaint. The complaint involves an extensive differential diagnosis and treatment options and also carries with it a high risk of complications and morbidity.    The differential diagnosis includes COPD exacerbation, CHF, pulmonary edema, pleural effusion, pneumonia, COVID-19, ACS  The initial plan is to get basic labs and start patient on breathing treatments.  We will reassess to see if he is improving, and the improvement is enough to justify discharge.  Additional  Test Considered: CT scan of the lungs, however the exam is more consistent with COPD exacerbation at this time, CT decision will be deferred to the inpatient team.  Additional history obtained: Records reviewed previous admission documents and Primary Care Documents  Reassessment and review: Lab Tests: I Ordered, and personally interpreted labs.  The pertinent results include: Normal-appearing CBC and metabolic profile and flu, COVID-19 test which is negative.  Imaging Studies ordered: I independently visualized and interpreted the following imaging X-ray of the chest which showed no evidence of pneumothorax, large pleural effusion. I agree with the  radiologist interpretation  Cardiac Monitoring: The patient was maintained on a cardiac monitor.  I personally viewed and interpreted the cardiac monitor which showed an underlying rhythm of:  sinus tachycardia  Medicines ordered and prescription drug management: I ordered the following medications albuterol for respiratory distress.   Reevaluation of the patient after these medicines showed that the patient    improved  Consultations Obtained: I requested consultation with the admitting physician, and discussed  findings as well as pertinent plan - they recommend: Agreement with the decision to admit.  Complexity of problems addressed: Patient's presentation is most consistent with  severe exacerbation of chronic illness     During patient's assessment  Disposition: After consideration of the diagnostic results and the patient's response to treatment,  I feel that the patent would benefit from admission to medicine for optimization.    Problems Addressed: COPD (chronic obstructive pulmonary disease) ([place]): acute illness or injury that poses a threat to life or bodily functions  Amount and/or Complexity of Data Reviewed External Data Reviewed: notes. Labs: ordered. Radiology: ordered and independent interpretation  performed.  Risk Prescription drug management. Decision regarding hospitalization.    Final Clinical Impression(s) / ED Diagnoses Final diagnoses:  COPD (chronic obstructive pulmonary disease) ([place])  Acute on chronic respiratory failure with hypoxia ([place])    Rx / DC Orders ED Discharge Orders          Ordered    montelukast (SINGULAIR) 10 MG tablet  Daily at bedtime        01/04/22 0832    Increase activity slowly        01/04/22 0832    Diet - low sodium heart healthy        01/04/22 7371    Discharge instructions       Comments: Follow-up with your primary care physician in 2 weeks.  Follow-up with pulmonary clinic on 01/10/2022.  Continue to take medications as prescribed.  No overexertion.  Continue oxygen at home.  Seek medical attention for worsening symptoms. Your dose of steroid has changed until you see your pulmonary doctor   01/04/22 0832    Diet Carb Modified        01/04/22 0832    Call MD for:  temperature >100.4        01/04/22 0832    Call MD for:  difficulty breathing, headache or visual disturbances        01/04/22 0832    predniSONE (DELTASONE) 20 MG tablet  Daily with breakfast        01/04/22 0626              [name], MD 01/10/22 [id number]

## 2022-01-10 NOTE — Assessment & Plan Note (Signed)
Improving COPD exacerbation.  We will slowly decrease prednisone down by 10 mg every 3 days and hold at 5 mg daily dosing.  Plan  Patient Instructions  Prednisone taper as discussed  Continue on BREZTRI 2 puffs Twice daily, rinse after use.  Mucinex Twice daily  As needed  Cough/congestion  Flutter valve Three times a day  .  Albuterol Neb or inhaler as needed  Wear Oxygen 2l/m  with activity and At bedtime   to maintain O2 sats >88-90%.  Activity as tolerated.  Please contact office for sooner follow up if symptoms do not improve or worsen or seek emergency care  Follow up with Dr. Melvyn Novas  Or Toa Mia NP in 4-6  weeks and As needed

## 2022-01-10 NOTE — Assessment & Plan Note (Signed)
Continue on oxygen 2 L to maintain O2 saturations greater than 88 to 90%  Plan  Patient Instructions  Prednisone taper as discussed  Continue on BREZTRI 2 puffs Twice daily, rinse after use.  Mucinex Twice daily  As needed  Cough/congestion  Flutter valve Three times a day  .  Albuterol Neb or inhaler as needed  Wear Oxygen 2l/m  with activity and At bedtime   to maintain O2 sats >88-90%.  Activity as tolerated.  Please contact office for sooner follow up if symptoms do not improve or worsen or seek emergency care  Follow up with Dr. Melvyn Novas  Or Makaveli Hoard NP in 4-6  weeks and As needed

## 2022-01-10 NOTE — Progress Notes (Signed)
@Patient  ID: Paul [name], male    DOB: 1946/08/22, 76 y.o.   MRN: [id number]  Chief Complaint  Patient presents with   Hospitalization Follow-up    Referring provider: [name], MD  HPI: 76 year old male former smoker quit in 2010 followed for severe Gold 3 COPD and chronic respiratory failure on oxygen prone to recurrent COPD exacerbations. Medical history significant for atrial fibrillation COVID-19 infection January 2022 required hospitalization  TEST/EVENTS :  05/29/16: FVC 2.08 L (86%) FEV1 0.83 L (31%) FEV1/FVC 0.40 FEF 25-75 0.30 L (14%) no bronchodilator response TLC 6.56 L (107%) RV 167% ERV 160% DLCO corrected 60% (hemoglobin 10.9) 07/15/13: FVC 2.68 L (60%) FEV1 1.63 L (53%) FEV1/FVC 0.61 FEF 25-75 0.76 L (26%) 02/04/12: FVC 2.18 L (57%) FEV1 1.09 L (36%) FEV1/FVC 0.50 FEF 25-75 0.33 L (11%)     CT sinus February 2021 no acute sinus disease noted CT chest February 2021 emphysematous changes, several old thoracic compression fractures   2D echo July 2020 EF 60-65%,   Palliative care started 2021 (nurse comes every 2 weeks)   01/10/2022 Follow up ; COPD, chronic respiratory failure on oxygen Patient returns for a 6-week follow-up and post hospital follow-up patient has underlying severe COPD.  He is prone to frequent flareups. Despite this patient is very active goes to the gym 5 days a week.  He remains on Breztri twice daily.  He is on chronic prednisone at prednisone 5 mg daily He remains on oxygen 2 L with activity and at bedtime. Influenza and COVID-vaccine are up-to-date.  Patient was recently hospitalized earlier this month for COPD exacerbation secondary to RSV infection.  He was treated with IV steroids and antibiotics.  And nebulized bronchodilators.  Patient was discharged on a prednisone taper and azithromycin. Required BIPAP support initially.   Since discharge patient is feeling much better. Remains weak, low activity tolerance.  Wants to start back at  gym. Discharged on prednisone 40mg  daily. Remains on this dose.  Says he is eating well.  No nausea vomiting or diarrhea.  No hemoptysis or chest pain.    Allergies  Allergen Reactions   Daliresp [Roflumilast] Diarrhea and Nausea And Vomiting   Levaquin [Levofloxacin] Palpitations    Made the B/P fluctuate and heart raced   Alendronate Anxiety and Other (See Comments)    Jittery/nervous    Immunization History  Administered Date(s) Administered   Fluad Quad(high Dose 65+) 10/02/2020, 10/09/2021   Influenza Split 09/22/2013, 09/01/2015   Influenza Whole 09/04/2011, 09/21/2012   Influenza, High Dose Seasonal PF 09/02/2016, 09/10/2017, 08/31/2018, 09/24/2018, 10/21/2019   Influenza-Unspecified 08/23/2014   Moderna Covid-19 Vaccine Bivalent Booster 25yrs & up 10/09/2021   Moderna SARS-COV2 Booster Vaccination 07/05/2021   Moderna Sars-Covid-2 Vaccination 01/17/2020, 02/14/2020, 10/06/2020   Pneumococcal Conjugate-13 01/11/2014, 12/09/2016   Pneumococcal Polysaccharide-23 08/23/2010, 08/29/2011   Zoster Recombinat (Shingrix) 04/04/2014    Past Medical History:  Diagnosis Date   Anemia    Aortic atherosclerosis (HCC)    Atrial fibrillation (HCC)    Back pain    COPD (chronic obstructive pulmonary disease) ([place])    COVID-19 12/24/2020   Diabetes mellitus without complication (HCC)    Elevated PSA    GAD (generalized anxiety disorder)    GERD (gastroesophageal reflux disease)    High cholesterol    Hypertension    Lingular pneumonia 01/05/2021   See cxr 01/06/20 rx with zpak/ cefipime x 5 days and then developed purulent sputum again 01/12/21 so rx levcaquin 500 mg daily  x 7 days then return for cxr before more    Lung nodule < 6cm on CT 05/29/2016   Osteoporosis    Oxygen deficiency    Pneumonia    January 2022   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 Quentin Ore with a 20 mm Watchman FLX device   Vertebral compression fracture ([place])  05/29/2019   T8 compression fracture noted on CT scan 05/28/2019 inpatient with chronic steroid dependent COPD - rx calcitonin nasal spray rx per PCP     Tobacco History: Social History   Tobacco Use  Smoking Status Former   Packs/day: 2.00   Years: 52.00   Pack years: 104.00   Types: Cigarettes   Quit date: 02/03/2009   Years since quitting: 12.9  Smokeless Tobacco Never  Tobacco Comments   Counseled to remain smoke free   Counseling given: Not Answered Tobacco comments: Counseled to remain smoke free   Outpatient Medications Prior to Visit  Medication Sig Dispense Refill   acetaminophen (TYLENOL) 325 MG tablet Take 650 mg by mouth every 6 (six) hours as needed for mild pain or fever.     albuterol (PROAIR HFA) 108 (90 Base) MCG/ACT inhaler Inhale 2 puffs into the lungs every 6 (six) hours as needed for wheezing or shortness of breath.      albuterol (PROVENTIL) (2.5 MG/3ML) 0.083% nebulizer solution Take 3 mLs (2.5 mg total) by nebulization in the morning and at bedtime. 75 mL 6   Ascorbic Acid (VITAMIN C) 1000 MG tablet Take 1,000 mg by mouth daily.     atorvastatin (LIPITOR) 40 MG tablet TAKE 1 TABLET BY MOUTH ONCE DAILY AFTER SUPPER (Patient taking differently: Take 40 mg by mouth every evening.) 90 tablet 0   bisoprolol (ZEBETA) 5 MG tablet Take 1 tablet (5 mg total) by mouth daily. 90 tablet 3   Budeson-Glycopyrrol-Formoterol (BREZTRI AEROSPHERE) 160-9-4.8 MCG/ACT AERO Inhale 2 puffs into the lungs 2 (two) times daily. 32.1 g 3   calcium carbonate (OS-CAL) 600 MG TABS tablet Take 600 mg by mouth daily.     Cholecalciferol (VITAMIN D) 50 MCG (2000 UT) CAPS Take 2,000 Units by mouth daily.     clopidogrel (PLAVIX) 75 MG tablet Take 1 tablet (75 mg total) by mouth daily. 90 tablet 1   denosumab (PROLIA) 60 MG/ML SOSY injection Inject 60 mg into the skin every 6 (six) months.     diltiazem (CARDIZEM) 60 MG tablet Take 1 tablet (60 mg total) by mouth 4 (four) times daily as needed.  30 tablet prn   ferrous sulfate 325 (65 FE) MG tablet Take 325 mg by mouth 2 (two) times daily with a meal.     furosemide (LASIX) 40 MG tablet Take 1 tablet (40 mg total) by mouth daily. 90 tablet 3   guaiFENesin (MUCINEX) 600 MG 12 hr tablet Take 600 mg by mouth 2 (two) times daily.     loratadine (CLARITIN) 10 MG tablet Take 10 mg by mouth in the morning.     montelukast (SINGULAIR) 10 MG tablet Take 1 tablet (10 mg total) by mouth at bedtime. 30 tablet 2   Multiple Vitamin (MULTIVITAMIN WITH MINERALS) TABS tablet Take 1 tablet by mouth daily.     Niacinamide-Zn-Cu-Methfo-Se-Cr (NICOTINAMIDE PO) Take 1 tablet by mouth in the morning and at bedtime.     OXYGEN Inhale 2-2.5 L/min into the lungs See admin instructions. Inhale 2 L/min of oxygen into the lungs at bedtime and  when ambulatory     pantoprazole (PROTONIX) 40 MG tablet Take 1 tablet by mouth once daily (Patient taking differently: Take 40 mg by mouth daily.) 90 tablet 3   predniSONE (DELTASONE) 20 MG tablet Take 2 tablets (40 mg total) by mouth daily with breakfast. 60 tablet 0   sertraline (ZOLOFT) 50 MG tablet Take 1 tablet (50 mg total) by mouth daily. 90 tablet 1   tiZANidine (ZANAFLEX) 2 MG tablet Take 1 tablet (2 mg total) by mouth every 6 (six) hours as needed for muscle spasms. 30 tablet 0   No facility-administered medications prior to visit.     Review of Systems:   Constitutional:   No  weight loss, night sweats,  Fevers, chills,  +fatigue, or  lassitude.  HEENT:   No headaches,  Difficulty swallowing,  Tooth/dental problems, or  Sore throat,                No sneezing, itching, ear ache,  +nasal congestion, post nasal drip,   CV:  No chest pain,  Orthopnea, PND, swelling in lower extremities, anasarca, dizziness, palpitations, syncope.   GI  No heartburn, indigestion, abdominal pain, nausea, vomiting, diarrhea, change in bowel habits, loss of appetite, bloody stools.   Resp: No chest wall deformity  Skin: no  rash or lesions.  GU: no dysuria, change in color of urine, no urgency or frequency.  No flank pain, no hematuria   MS:  No joint pain or swelling.  No decreased range of motion.  No back pain.    Physical Exam  BP (!) 124/48 (BP Location: Left Arm, Patient Position: Sitting, Cuff Size: Normal)    Pulse 68    Temp 98.1 F (36.7 C) (Oral)    Ht 5\' 6"  (1.676 m)    Wt 145 lb 3.2 oz (65.9 kg)    SpO2 96%    BMI 23.44 kg/m   GEN: A/Ox3; pleasant , NAD, elderly    HEENT:  [place]/AT,   NOSE-clear, THROAT-clear, no lesions, no postnasal drip or exudate noted.   NECK:  Supple w/ fair ROM; no JVD; normal carotid impulses w/o bruits; no thyromegaly or nodules palpated; no lymphadenopathy.    RESP  Clear  P & A; w/o, wheezes/ rales/ or rhonchi. no accessory muscle use, no dullness to percussion  CARD:  RRR, no m/r/g, no  peripheral edema, pulses intact, no cyanosis or clubbing.  GI:   Soft & nt; nml bowel sounds; no organomegaly or masses detected.   Musco: Warm bil, no deformities or joint swelling noted.   Neuro: alert, no focal deficits noted.    Skin: Warm, no lesions or rashes    Lab Results:   BMET   BNP   ProBNP No results found for: PROBNP  Imaging: DG Chest 2 View  Result Date: 12/28/2021 CLINICAL DATA:  COPD and shortness of breath. EXAM: CHEST - 2 VIEW COMPARISON:  12/27/2021 FINDINGS: Lungs are hyperexpanded. The lungs are clear without focal pneumonia, edema, pneumothorax or pleural effusion. The cardiopericardial silhouette is within normal limits for size. Telemetry leads overlie the chest. IMPRESSION: Emphysema without acute cardiopulmonary findings. Electronically Signed   By: Misty Stanley M.D.   On: 12/28/2021 06:35   DG Chest 2 View  Result Date: 12/27/2021 CLINICAL DATA:  SOB EXAM: CHEST - 2 VIEW COMPARISON:  08/02/2021 FINDINGS: No consolidation. No visible pleural effusions or pneumothorax. Cardiomediastinal silhouette is within normal limits and similar to  prior. L1 compression fracture with approximately 50% height  loss and kyphoplasty, which is new since 08/02/2021 but similar in appearance to intraoperative fluoroscopic imaging from kyphoplasty on 11/28/2021. Multiple additional compression fractures in thoracic spine appears similar. IMPRESSION: No evidence of acute cardiopulmonary disease. Electronically Signed   By: Margaretha Sheffield M.D.   On: 12/27/2021 09:05   DG Radiologist Eval And Mgmt  Result Date: 12/12/2021 EXAM: NEW PATIENT OFFICE VISIT CHIEF COMPLAINT: See dictated note in EPIC. HISTORY OF PRESENT ILLNESS: See dictated note in EPIC. REVIEW OF SYSTEMS: See dictated note in EPIC. PHYSICAL EXAMINATION: See dictated note in EPIC. ASSESSMENT AND PLAN: See dictated note in EPIC. Electronically Signed   By: Jacqulynn Cadet M.D.   On: 12/12/2021 16:42      PFT Results Latest Ref Rng & Units 05/29/2016  FVC-Pre L 1.92  FVC-Predicted Pre % 52  Pre FEV1/FVC % % 41  FEV1-Pre L 0.78  FEV1-Predicted Pre % 29    No results found for: NITRICOXIDE      Assessment & Plan:   COPD exacerbation (HCC) Improving COPD exacerbation.  We will slowly decrease prednisone down by 10 mg every 3 days and hold at 5 mg daily dosing.  Plan  Patient Instructions  Prednisone taper as discussed  Continue on BREZTRI 2 puffs Twice daily, rinse after use.  Mucinex Twice daily  As needed  Cough/congestion  Flutter valve Three times a day  .  Albuterol Neb or inhaler as needed  Wear Oxygen 2l/m  with activity and At bedtime   to maintain O2 sats >88-90%.  Activity as tolerated.  Please contact office for sooner follow up if symptoms do not improve or worsen or seek emergency care  Follow up with Dr. Melvyn Novas  Or Georgenia Salim NP in 4-6  weeks and As needed        Chronic respiratory failure with hypoxia ([place]) Continue on oxygen 2 L to maintain O2 saturations greater than 88 to 90%  Plan  Patient Instructions  Prednisone taper as discussed  Continue on  BREZTRI 2 puffs Twice daily, rinse after use.  Mucinex Twice daily  As needed  Cough/congestion  Flutter valve Three times a day  .  Albuterol Neb or inhaler as needed  Wear Oxygen 2l/m  with activity and At bedtime   to maintain O2 sats >88-90%.  Activity as tolerated.  Please contact office for sooner follow up if symptoms do not improve or worsen or seek emergency care  Follow up with Dr. Melvyn Novas  Or Ambrie Carte NP in 4-6  weeks and As needed        Chronic diastolic CHF (congestive heart failure) ([place]) Appears compensated and euvolemic on exam continue with current regimen   I spent  41  minutes dedicated to the care of this patient on the date of this encounter to include pre-visit review of records, face-to-face time with the patient discussing conditions above, post visit ordering of testing, clinical documentation with the electronic health record, making appropriate referrals as documented, and communicating necessary findings to members of the patients care team.    Rexene Edison, NP 01/10/2022

## 2022-01-10 NOTE — Assessment & Plan Note (Signed)
Appears compensated and euvolemic on exam continue with current regimen

## 2022-01-14 ENCOUNTER — Other Ambulatory Visit: Payer: Self-pay

## 2022-01-14 ENCOUNTER — Ambulatory Visit (INDEPENDENT_AMBULATORY_CARE_PROVIDER_SITE_OTHER): Payer: Medicare Other | Admitting: Family Medicine

## 2022-01-14 VITALS — BP 120/50 | HR 84 | Temp 99.4°F | Resp 18 | Ht 66.0 in | Wt 146.0 lb

## 2022-01-14 DIAGNOSIS — Z9981 Dependence on supplemental oxygen: Secondary | ICD-10-CM | POA: Diagnosis not present

## 2022-01-14 DIAGNOSIS — F411 Generalized anxiety disorder: Secondary | ICD-10-CM

## 2022-01-14 DIAGNOSIS — J205 Acute bronchitis due to respiratory syncytial virus: Secondary | ICD-10-CM

## 2022-01-14 DIAGNOSIS — J441 Chronic obstructive pulmonary disease with (acute) exacerbation: Secondary | ICD-10-CM

## 2022-01-14 DIAGNOSIS — I10 Essential (primary) hypertension: Secondary | ICD-10-CM | POA: Diagnosis not present

## 2022-01-14 DIAGNOSIS — J9611 Chronic respiratory failure with hypoxia: Secondary | ICD-10-CM

## 2022-01-14 NOTE — Progress Notes (Signed)
Subjective:  Patient ID: Paul [name], male    DOB: 1946-11-11  Age: 76 y.o. MRN: [id number]  Chief Complaint  Patient presents with   Hospitalization Follow-up    HPI Patient is a 76 year old white male with past medical history significant for COPD Gold stage III, oxygen dependent and steroid-dependent at baseline, who presents for hospital follow-up secondary to COPD exacerbation, chronic respiratory failure with worsening shortness of breath, cough and respiratory distress he was initially managed with BiPAP.  Patient was admitted on January 05/2022 and discharged on January 04, 2022 to his home.Following admission he was found to be RSV positive.  COVID-negative and flu negative.  During the patient's admission he received Solu-Medrol Q 60 mg 3 times daily which was then changed to prednisone 40 mg daily upon discharge for which it was anticipated he would be weaned off of gradually by Dr. Melvyn Novas over the next month.  He completed a 5-day course of Zithromax.  He was back on 2 L of oxygen when discharged.  The patient is feeling much better.  His shortness of breath has returned to baseline.  Denies coughing beyond his baseline.  Denies fevers or chills.  Appetite is good.  Current Outpatient Medications on File Prior to Visit  Medication Sig Dispense Refill   acetaminophen (TYLENOL) 325 MG tablet Take 650 mg by mouth every 6 (six) hours as needed for mild pain or fever.     albuterol (PROAIR HFA) 108 (90 Base) MCG/ACT inhaler Inhale 2 puffs into the lungs every 6 (six) hours as needed for wheezing or shortness of breath.      albuterol (PROVENTIL) (2.5 MG/3ML) 0.083% nebulizer solution Take 3 mLs (2.5 mg total) by nebulization in the morning and at bedtime. 75 mL 6   Ascorbic Acid (VITAMIN C) 1000 MG tablet Take 1,000 mg by mouth daily.     atorvastatin (LIPITOR) 40 MG tablet TAKE 1 TABLET BY MOUTH ONCE DAILY AFTER SUPPER (Patient taking differently: Take 40 mg by mouth every evening.) 90  tablet 0   bisoprolol (ZEBETA) 5 MG tablet Take 1 tablet (5 mg total) by mouth daily. 90 tablet 3   Budeson-Glycopyrrol-Formoterol (BREZTRI AEROSPHERE) 160-9-4.8 MCG/ACT AERO Inhale 2 puffs into the lungs 2 (two) times daily. 32.1 g 3   calcium carbonate (OS-CAL) 600 MG TABS tablet Take 600 mg by mouth daily.     Cholecalciferol (VITAMIN D) 50 MCG (2000 UT) CAPS Take 2,000 Units by mouth daily.     clopidogrel (PLAVIX) 75 MG tablet Take 1 tablet (75 mg total) by mouth daily. 90 tablet 1   denosumab (PROLIA) 60 MG/ML SOSY injection Inject 60 mg into the skin every 6 (six) months.     diltiazem (CARDIZEM) 60 MG tablet Take 1 tablet (60 mg total) by mouth 4 (four) times daily as needed. 30 tablet prn   ferrous sulfate 325 (65 FE) MG tablet Take 325 mg by mouth 2 (two) times daily with a meal.     furosemide (LASIX) 40 MG tablet Take 1 tablet (40 mg total) by mouth daily. 90 tablet 3   guaiFENesin (MUCINEX) 600 MG 12 hr tablet Take 600 mg by mouth 2 (two) times daily.     loratadine (CLARITIN) 10 MG tablet Take 10 mg by mouth in the morning.     Multiple Vitamin (MULTIVITAMIN WITH MINERALS) TABS tablet Take 1 tablet by mouth daily.     Niacinamide-Zn-Cu-Methfo-Se-Cr (NICOTINAMIDE PO) Take 1 tablet by mouth in the morning and at bedtime.  OXYGEN Inhale 2-2.5 L/min into the lungs See admin instructions. Inhale 2 L/min of oxygen into the lungs at bedtime and when ambulatory     pantoprazole (PROTONIX) 40 MG tablet Take 1 tablet by mouth once daily (Patient taking differently: Take 40 mg by mouth daily.) 90 tablet 3   predniSONE (DELTASONE) 10 MG tablet 4 tabs for 3 days, then 3 tabs for 3 days, 2 tabs for 3 days, then 1 tab for 3 days, then hold at 5mg  daily 30 tablet 0   predniSONE (DELTASONE) 20 MG tablet Take 2 tablets (40 mg total) by mouth daily with breakfast. 60 tablet 0   sertraline (ZOLOFT) 50 MG tablet Take 1 tablet (50 mg total) by mouth daily. 90 tablet 1   tiZANidine (ZANAFLEX) 2 MG  tablet Take 1 tablet (2 mg total) by mouth every 6 (six) hours as needed for muscle spasms. 30 tablet 0   No current facility-administered medications on file prior to visit.   Past Medical History:  Diagnosis Date   Anemia    Aortic atherosclerosis (HCC)    Atrial fibrillation (HCC)    Back pain    COPD (chronic obstructive pulmonary disease) ([place])    COVID-19 12/24/2020   Diabetes mellitus without complication (HCC)    Elevated PSA    GAD (generalized anxiety disorder)    GERD (gastroesophageal reflux disease)    High cholesterol    Hypertension    Lingular pneumonia 01/05/2021   See cxr 01/06/20 rx with zpak/ cefipime x 5 days and then developed purulent sputum again 01/12/21 so rx levcaquin 500 mg daily x 7 days then return for cxr before more    Lung nodule < 6cm on CT 05/29/2016   Osteoporosis    Oxygen deficiency    Pneumonia    January 2022   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 Quentin Ore with a 20 mm Watchman FLX device   Vertebral compression fracture ([place]) 05/29/2019   T8 compression fracture noted on CT scan 05/28/2019 inpatient with chronic steroid dependent COPD - rx calcitonin nasal spray rx per PCP    Past Surgical History:  Procedure Laterality Date   CATARACT EXTRACTION Right    IR KYPHO EA ADDL LEVEL THORACIC OR LUMBAR  11/28/2021   LEFT ATRIAL APPENDAGE OCCLUSION N/A 08/02/2021   Procedure: LEFT ATRIAL APPENDAGE OCCLUSION;  Surgeon: Sherren Mocha, MD;  Location: [facility];  Service: Cardiovascular;  Laterality: N/A;   SKIN SURGERY     shoulders and chest   TEE WITHOUT CARDIOVERSION N/A 08/02/2021   Procedure: TRANSESOPHAGEAL ECHOCARDIOGRAM (TEE);  Surgeon: Sherren Mocha, MD;  Location: [facility];  Service: Cardiovascular;  Laterality: N/A;   TEE WITHOUT CARDIOVERSION N/A 09/13/2021   Procedure: TRANSESOPHAGEAL ECHOCARDIOGRAM (TEE);  Surgeon: Sanda Klein, MD;  Location[facility];  Service:  Cardiovascular;  Laterality: N/A;    Family History  Problem Relation Age of Onset   Heart disease Brother    Heart disease Mother    Heart disease Father    Cancer Paternal Uncle    Social History   Socioeconomic History   Marital status: Married    Spouse name: Not on file   Number of children: 3   Years of education: Not on file   Highest education level: Not on file  Occupational History   Occupation: retired    Comment: truck driver  Tobacco Use   Smoking status: Former    Packs/day:  2.00    Years: 52.00    Pack years: 104.00    Types: Cigarettes    Quit date: 02/03/2009    Years since quitting: 12.9   Smokeless tobacco: Never   Tobacco comments:    Counseled to remain smoke free  Vaping Use   Vaping Use: Never used  Substance and Sexual Activity   Alcohol use: Not Currently    Alcohol/week: 0.0 standard drinks    Comment: occ   Drug use: No   Sexual activity: Not on file  Other Topics Concern   Not on file  Social History Narrative   Originally from [place]. Previously worked driving a Restaurant manager, fast food. No pets currently. No mold exposure. 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       wears sunscreen, brushes and flosses daily, see's dentist bi-annually, has smoke/carbon monoxide detectors, wears a seatbelt and practices gun safety      Social Determinants of Health   Financial Resource Strain: High Risk   Difficulty of Paying Living Expenses: Hard  Food Insecurity: Not on file  Transportation Needs: Not on file  Physical Activity: Not on file  Stress: Not on file  Social Connections: Not on file    Review of Systems  Constitutional:  Negative for chills and fever.  HENT:  Negative for congestion, rhinorrhea and sore throat.   Respiratory:  Positive for cough and shortness of breath.   Cardiovascular:  Negative for chest pain and palpitations.  Gastrointestinal:  Negative for abdominal pain,  constipation, diarrhea, nausea and vomiting.  Genitourinary:  Negative for dysuria and urgency.  Musculoskeletal:  Negative for arthralgias, back pain and myalgias.  Neurological:  Negative for dizziness and headaches.  Psychiatric/Behavioral:  Negative for dysphoric mood. The patient is not nervous/anxious.     Objective:  BP (!) 120/50    Pulse 84    Temp 99.4 F (37.4 C)    Resp 18    Ht 5\' 6"  (1.676 m)    Wt 146 lb (66.2 kg)    BMI 23.57 kg/m   BP/Weight 01/14/2022 01/10/2022 6/56/8127  Systolic BP 517 001 749  Diastolic BP 50 48 64  Wt. (Lbs) 146 145.2 146.61  BMI 23.57 23.44 23.66    Physical Exam Vitals reviewed.  Constitutional:      Appearance: Normal appearance.  Neck:     Vascular: No carotid bruit.  Cardiovascular:     Rate and Rhythm: Normal rate and regular rhythm.     Heart sounds: Normal heart sounds.  Pulmonary:     Effort: Pulmonary effort is normal.     Breath sounds: Normal breath sounds. No wheezing, rhonchi or rales.     Comments: On 2 L oxygen Abdominal:     General: Bowel sounds are normal.     Palpations: Abdomen is soft.     Tenderness: There is no abdominal tenderness.  Neurological:     Mental Status: He is alert and oriented to person, place, and time.  Psychiatric:        Mood and Affect: Mood normal.        Behavior: Behavior normal.    Diabetic Foot Exam - Simple   No data filed      Lab Results  Component Value Date   WBC 16.1 (H) 01/14/2022   HGB 12.6 (L) 01/14/2022   HCT 39.0 01/14/2022   PLT 301 01/14/2022   GLUCOSE 109 (H) 01/14/2022  CHOL 144 10/05/2021   TRIG 180 (H) 10/05/2021   HDL 44 10/05/2021   LDLCALC 70 10/05/2021   ALT 40 01/14/2022   AST 27 01/14/2022   NA 143 01/14/2022   K 4.6 01/14/2022   CL 99 01/14/2022   CREATININE 0.80 01/14/2022   BUN 15 01/14/2022   CO2 30 (H) 01/14/2022   TSH 2.539 03/22/2019   INR 1.4 (H) 12/24/2020   HGBA1C 6.5 (H) 12/27/2021   MICROALBUR Negative 10/09/2021       Assessment & Plan:   Problem List Items Addressed This Visit       Cardiovascular and Mediastinum   Essential hypertension - Primary    The current medical regimen is effective;  continue present plan and medications.       Relevant Orders   CBC with Differential/Platelet (Completed)   Comprehensive metabolic panel (Completed)     Respiratory   Chronic respiratory failure with hypoxia (HCC)    Continue O2 2 L PNC continuously.      COPD exacerbation ([place])    Resolved. Continue taper off prednisone as given by hospital or as instructed by pulmonology      RSV bronchitis    resolved        Other   GAD (generalized anxiety disorder)    The current medical regimen is effective;  continue present plan and medications. Continue zoloft.      Oxygen dependent  .  No orders of the defined types were placed in this encounter.   Orders Placed This Encounter  Procedures   CBC with Differential/Platelet   Comprehensive metabolic panel     Follow-up: Return for chronic visit previously scheduled. .  An After Visit Summary was printed and given to the patient.  [name], MD [facility] [phone]

## 2022-01-15 LAB — CBC WITH DIFFERENTIAL/PLATELET
Basophils Absolute: 0 10*3/uL (ref 0.0–0.2)
Basos: 0 %
EOS (ABSOLUTE): 0 10*3/uL (ref 0.0–0.4)
Eos: 0 %
Hematocrit: 39 % (ref 37.5–51.0)
Hemoglobin: 12.6 g/dL — ABNORMAL LOW (ref 13.0–17.7)
Immature Grans (Abs): 0 10*3/uL (ref 0.0–0.1)
Immature Granulocytes: 0 %
Lymphocytes Absolute: 0.9 10*3/uL (ref 0.7–3.1)
Lymphs: 6 %
MCH: 25.7 pg — ABNORMAL LOW (ref 26.6–33.0)
MCHC: 32.3 g/dL (ref 31.5–35.7)
MCV: 80 fL (ref 79–97)
Monocytes Absolute: 0.3 10*3/uL (ref 0.1–0.9)
Monocytes: 2 %
Neutrophils Absolute: 14.8 10*3/uL — ABNORMAL HIGH (ref 1.4–7.0)
Neutrophils: 92 %
Platelets: 301 10*3/uL (ref 150–450)
RBC: 4.9 x10E6/uL (ref 4.14–5.80)
RDW: 15.4 % (ref 11.6–15.4)
WBC: 16.1 10*3/uL — ABNORMAL HIGH (ref 3.4–10.8)

## 2022-01-15 LAB — COMPREHENSIVE METABOLIC PANEL
ALT: 40 IU/L (ref 0–44)
AST: 27 IU/L (ref 0–40)
Albumin/Globulin Ratio: 2.1 (ref 1.2–2.2)
Albumin: 4.2 g/dL (ref 3.7–4.7)
Alkaline Phosphatase: 87 IU/L (ref 44–121)
BUN/Creatinine Ratio: 19 (ref 10–24)
BUN: 15 mg/dL (ref 8–27)
Bilirubin Total: 0.3 mg/dL (ref 0.0–1.2)
CO2: 30 mmol/L — ABNORMAL HIGH (ref 20–29)
Calcium: 9 mg/dL (ref 8.6–10.2)
Chloride: 99 mmol/L (ref 96–106)
Creatinine, Ser: 0.8 mg/dL (ref 0.76–1.27)
Globulin, Total: 2 g/dL (ref 1.5–4.5)
Glucose: 109 mg/dL — ABNORMAL HIGH (ref 70–99)
Potassium: 4.6 mmol/L (ref 3.5–5.2)
Sodium: 143 mmol/L (ref 134–144)
Total Protein: 6.2 g/dL (ref 6.0–8.5)
eGFR: 92 mL/min/{1.73_m2} (ref >59)

## 2022-01-15 NOTE — Progress Notes (Signed)
Blood count abnormal. Wbc elevated, but I am sure this is due to prednisone.  Liver function normal.  Kidney function normal.

## 2022-01-22 DIAGNOSIS — I480 Paroxysmal atrial fibrillation: Secondary | ICD-10-CM | POA: Diagnosis not present

## 2022-01-22 DIAGNOSIS — J449 Chronic obstructive pulmonary disease, unspecified: Secondary | ICD-10-CM | POA: Diagnosis not present

## 2022-01-22 DIAGNOSIS — I10 Essential (primary) hypertension: Secondary | ICD-10-CM | POA: Diagnosis not present

## 2022-01-22 DIAGNOSIS — E119 Type 2 diabetes mellitus without complications: Secondary | ICD-10-CM | POA: Diagnosis not present

## 2022-01-27 ENCOUNTER — Encounter: Payer: Self-pay | Admitting: Family Medicine

## 2022-01-27 DIAGNOSIS — J205 Acute bronchitis due to respiratory syncytial virus: Secondary | ICD-10-CM

## 2022-01-27 HISTORY — DX: Acute bronchitis due to respiratory syncytial virus: J20.5

## 2022-01-27 NOTE — Assessment & Plan Note (Signed)
The current medical regimen is effective;  continue present plan and medications. Continue zoloft.

## 2022-01-27 NOTE — Assessment & Plan Note (Signed)
The current medical regimen is effective;  continue present plan and medications.  

## 2022-01-27 NOTE — Assessment & Plan Note (Signed)
Resolved. Continue taper off prednisone as given by hospital or as instructed by pulmonology

## 2022-01-27 NOTE — Assessment & Plan Note (Signed)
Continue O2 2 L PNC continuously.

## 2022-01-27 NOTE — Assessment & Plan Note (Signed)
resolved 

## 2022-01-28 ENCOUNTER — Ambulatory Visit (INDEPENDENT_AMBULATORY_CARE_PROVIDER_SITE_OTHER): Payer: Medicare Other | Admitting: Cardiology

## 2022-01-28 ENCOUNTER — Other Ambulatory Visit: Payer: Self-pay

## 2022-01-28 VITALS — BP 100/50 | HR 90 | Ht 66.0 in | Wt 146.0 lb

## 2022-01-28 DIAGNOSIS — I10 Essential (primary) hypertension: Secondary | ICD-10-CM | POA: Diagnosis not present

## 2022-01-28 DIAGNOSIS — I480 Paroxysmal atrial fibrillation: Secondary | ICD-10-CM | POA: Diagnosis not present

## 2022-01-28 DIAGNOSIS — R319 Hematuria, unspecified: Secondary | ICD-10-CM | POA: Diagnosis not present

## 2022-01-28 DIAGNOSIS — Z95818 Presence of other cardiac implants and grafts: Secondary | ICD-10-CM | POA: Diagnosis not present

## 2022-01-28 MED ORDER — BISOPROLOL FUMARATE 5 MG PO TABS: 5.0000 mg | ORAL_TABLET | Freq: Every day | ORAL | 3 refills | Status: DC

## 2022-01-28 MED ORDER — ASPIRIN EC 81 MG PO TBEC: 81.0000 mg | DELAYED_RELEASE_TABLET | Freq: Every day | ORAL | 3 refills | Status: AC

## 2022-01-28 NOTE — Progress Notes (Signed)
[facility]                                     Cardiology Office Note:    Date:  01/28/2022   ID:  JAKOREY MCCONATHY, DOB Sep 13, 1946, MRN [id number]  PCP:  [name], MD  [facility] Cardiologist:  Candee Furbish, MD  [facility] Electrophysiologist:  Vickie Epley, MD   Referring MD: [name], MD   Chief Complaint  Patient presents with   Follow-up   History of Present Illness:    Paul [name] is a 76 y.o. male with a hx of HTN, HLD, GERD, DMT2, COPD, PAF with poor candidacy for long term anticoagulation due to severe bleeding 2/2 hematuria, falls and skin tears s/p LAAO with Watchman on 08/02/21 who presents to clinic for 6 month follow up.  He underwent successful LAAO with a 20 mm Watchman FLX device by Dr. Quentin Ore on 08/02/21 for paroxysmal atrial fibrillation in the setting of hematuria, falls and skin tears. He was discharged on Eliquis 5mg  BID + along with ASA 81mg  daily with a plan to switch to Plavix monotherapy after TEE if TEE if demonstrated an adequate seal for a total of 6 months post procedure.   Today he presents with his wife and reports he is doing very well. He states that he was recently hospitalized with RSV for about 10 days but feels like he is returning to his baseline. He denies chest pain, palpitations, SOB, LE edema, dizziness, or syncope. Denies blood in stool or urine. Has some moderate bruising from Plavix. We discussed medication change to indefinite ASA.     Past Medical History:  Diagnosis Date   Anemia    Aortic atherosclerosis (HCC)    Atrial fibrillation (HCC)    Back pain    COPD (chronic obstructive pulmonary disease) ([place])    COVID-19 12/24/2020   Diabetes mellitus without complication (HCC)    Elevated PSA    GAD (generalized anxiety disorder)    GERD (gastroesophageal reflux disease)    High cholesterol    Hypertension    Lingular pneumonia 01/05/2021   See cxr 01/06/20 rx with zpak/ cefipime x 5 days and then  developed purulent sputum again 01/12/21 so rx levcaquin 500 mg daily x 7 days then return for cxr before more    Lung nodule < 6cm on CT 05/29/2016   Osteoporosis    Oxygen deficiency    Pneumonia    January 2022   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 Quentin Ore with a 20 mm Watchman FLX device   Vertebral compression fracture ([place]) 05/29/2019   T8 compression fracture noted on CT scan 05/28/2019 inpatient with chronic steroid dependent COPD - rx calcitonin nasal spray rx per PCP     Past Surgical History:  Procedure Laterality Date   CATARACT EXTRACTION Right    IR KYPHO EA ADDL LEVEL THORACIC OR LUMBAR  11/28/2021   LEFT ATRIAL APPENDAGE OCCLUSION N/A 08/02/2021   Procedure: LEFT ATRIAL APPENDAGE OCCLUSION;  Surgeon: Sherren Mocha, MD;  Location: [facility];  Service: Cardiovascular;  Laterality: N/A;   SKIN SURGERY     shoulders and chest   TEE WITHOUT CARDIOVERSION N/A 08/02/2021   Procedure: TRANSESOPHAGEAL ECHOCARDIOGRAM (TEE);  Surgeon: Sherren Mocha, MD;  Location: [facility];  Service: Cardiovascular;  Laterality: N/A;  TEE WITHOUT CARDIOVERSION N/A 09/13/2021   Procedure: TRANSESOPHAGEAL ECHOCARDIOGRAM (TEE);  Surgeon: Sanda Klein, MD;  Location: [facility];  Service: Cardiovascular;  Laterality: N/A;    Current Medications: Current Meds  Medication Sig   acetaminophen (TYLENOL) 325 MG tablet Take 650 mg by mouth every 6 (six) hours as needed for mild pain or fever.   albuterol (PROAIR HFA) 108 (90 Base) MCG/ACT inhaler Inhale 2 puffs into the lungs every 6 (six) hours as needed for wheezing or shortness of breath.    albuterol (PROVENTIL) (2.5 MG/3ML) 0.083% nebulizer solution Take 3 mLs (2.5 mg total) by nebulization in the morning and at bedtime.   Ascorbic Acid (VITAMIN C) 1000 MG tablet Take 1,000 mg by mouth daily.   aspirin EC 81 MG tablet Take 1 tablet (81 mg total) by mouth daily. Swallow whole.    atorvastatin (LIPITOR) 40 MG tablet TAKE 1 TABLET BY MOUTH ONCE DAILY AFTER SUPPER   Budeson-Glycopyrrol-Formoterol (BREZTRI AEROSPHERE) 160-9-4.8 MCG/ACT AERO Inhale 2 puffs into the lungs 2 (two) times daily.   calcium carbonate (OS-CAL) 600 MG TABS tablet Take 600 mg by mouth daily.   Cholecalciferol (VITAMIN D) 50 MCG (2000 UT) CAPS Take 2,000 Units by mouth daily.   denosumab (PROLIA) 60 MG/ML SOSY injection Inject 60 mg into the skin every 6 (six) months.   diltiazem (CARDIZEM) 60 MG tablet Take 1 tablet (60 mg total) by mouth 4 (four) times daily as needed.   ferrous sulfate 325 (65 FE) MG tablet Take 325 mg by mouth 2 (two) times daily with a meal.   furosemide (LASIX) 40 MG tablet Take 1 tablet (40 mg total) by mouth daily.   guaiFENesin (MUCINEX) 600 MG 12 hr tablet Take 600 mg by mouth 2 (two) times daily.   loratadine (CLARITIN) 10 MG tablet Take 10 mg by mouth in the morning.   Multiple Vitamin (MULTIVITAMIN WITH MINERALS) TABS tablet Take 1 tablet by mouth daily.   Niacinamide-Zn-Cu-Methfo-Se-Cr (NICOTINAMIDE PO) Take 1 tablet by mouth in the morning and at bedtime.   OXYGEN Inhale 2-2.5 L/min into the lungs See admin instructions. Inhale 2 L/min of oxygen into the lungs at bedtime and when ambulatory   pantoprazole (PROTONIX) 40 MG tablet Take 1 tablet by mouth once daily   predniSONE (DELTASONE) 10 MG tablet 4 tabs for 3 days, then 3 tabs for 3 days, 2 tabs for 3 days, then 1 tab for 3 days, then hold at 5mg  daily   predniSONE (DELTASONE) 20 MG tablet Take 2 tablets (40 mg total) by mouth daily with breakfast.   sertraline (ZOLOFT) 50 MG tablet Take 1 tablet (50 mg total) by mouth daily.   tiZANidine (ZANAFLEX) 2 MG tablet Take 1 tablet (2 mg total) by mouth every 6 (six) hours as needed for muscle spasms.   [DISCONTINUED] bisoprolol (ZEBETA) 5 MG tablet Take 1 tablet (5 mg total) by mouth daily.   [DISCONTINUED] clopidogrel (PLAVIX) 75 MG tablet Take 1 tablet (75 mg total) by  mouth daily.     Allergies:   Daliresp [roflumilast], Levaquin [levofloxacin], and Alendronate   Social History   Socioeconomic History   Marital status: Married    Spouse name: Not on file   Number of children: 3   Years of education: Not on file   Highest education level: Not on file  Occupational History   Occupation: retired    Comment: truck driver  Tobacco Use   Smoking status: Former    Packs/day: 2.00  Years: 52.00    Pack years: 104.00    Types: Cigarettes    Quit date: 02/03/2009    Years since quitting: 12.9   Smokeless tobacco: Never   Tobacco comments:    Counseled to remain smoke free  Vaping Use   Vaping Use: Never used  Substance and Sexual Activity   Alcohol use: Not Currently    Alcohol/week: 0.0 standard drinks    Comment: occ   Drug use: No   Sexual activity: Not on file  Other Topics Concern   Not on file  Social History Narrative   Originally from [place]. Previously worked driving a Restaurant manager, fast food. No pets currently. No mold exposure. 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       wears sunscreen, brushes and flosses daily, see's dentist bi-annually, has smoke/carbon monoxide detectors, wears a seatbelt and practices gun safety      Social Determinants of Health   Financial Resource Strain: High Risk   Difficulty of Paying Living Expenses: Hard  Food Insecurity: Not on file  Transportation Needs: Not on file  Physical Activity: Not on file  Stress: Not on file  Social Connections: Not on file    Family History: The patient's family history includes Cancer in his paternal uncle; Heart disease in his brother, father, and mother.  ROS:   Please see the history of present illness.    All other systems reviewed and are negative.  EKGs/Labs/Other Studies Reviewed:    The following studies were reviewed today:  TEE 09/13/21:   1. Left ventricular ejection fraction, by  estimation, is 60 to 65%. The  left ventricle has normal function. The left ventricle has no regional  wall motion abnormalities.   2. Right ventricular systolic function is normal. The right ventricular  size is normal.   3. Well-seated Watchman FLX 20 mm device without leak or thrombus. No  left atrial/left atrial appendage thrombus was detected.   4. The mitral valve is normal in structure. No evidence of mitral valve  regurgitation. No evidence of mitral stenosis.   5. The aortic valve is normal in structure. Aortic valve regurgitation is  not visualized. No aortic stenosis is present.   6. There is Moderate (Grade III) layered plaque involving the descending  aorta.   7. The inferior vena cava is normal in size with greater than 50%  respiratory variability, suggesting right atrial pressure of 3 mmHg.   Conclusion(s)/Recommendation(s): Normal biventricular function without  evidence of hemodynamically significant valvular heart disease. Watchman  device without leak or thrombus.   08/02/21 Transeptal Puncture Intra-procedural TEE which showed no LAA thrombus Left atrial appendage occlusive device placement on 08/02/21 by Dr. Quentin Ore.  This study demonstrated: IMPRESSIONS    1. Echo guided left atrial appendage closure. No LAA thrombus present.  The IAS was crossed in a mid and inferior location of the IAS. A 20 mm  Watchma FLX device was deployed with maximum diameter 16.8 mm (16%  compression). The device was well seated  without movement. No device leaks were present. The device remained stable  after deployment. A small pericardial effusion was present before the  case, and this did not change. A small residual iatrogenic ASD was present  due to transseptal puncture and  this demonstrated L to R shunting which was benign. No apparent  complications visualized. 3D post-processing confirmed no device leak and  optimal placement in the LAA.  2. Left ventricular ejection  fraction, by estimation, is 60 to 65%. The  left ventricle has normal function. The left ventricle has no regional  wall motion abnormalities.   3. Right ventricular systolic function is normal. The right ventricular  size is normal.   4. No left atrial/left atrial appendage thrombus was detected.   5. A small pericardial effusion is present. The pericardial effusion is  circumferential.   6. The mitral valve is grossly normal. Trivial mitral valve  regurgitation. No evidence of mitral stenosis.   7. The aortic valve is tricuspid. Aortic valve regurgitation is not  visualized. No aortic stenosis is present.   8. There is mild (Grade II) layered plaque involving the descending aorta  and transverse aorta.      EKG:  EKG is not ordered today.   Recent Labs: 12/27/2021: B Natriuretic Peptide 70.2 01/14/2022: ALT 40; BUN 15; Creatinine, Ser 0.80; Hemoglobin 12.6; Platelets 301; Potassium 4.6; Sodium 143   Recent Lipid Panel    Component Value Date/Time   CHOL 144 10/05/2021 0816   TRIG 180 (H) 10/05/2021 0816   HDL 44 10/05/2021 0816   CHOLHDL 3.3 10/05/2021 0816   LDLCALC 70 10/05/2021 0816    Physical Exam:    VS:  BP (!) 100/50 (BP Location: Left Arm, Patient Position: Sitting, Cuff Size: Normal)    Pulse 90    Ht 5\' 6"  (1.676 m)    Wt 146 lb (66.2 kg)    SpO2 93%    BMI 23.57 kg/m     Wt Readings from Last 3 Encounters:  01/28/22 146 lb (66.2 kg)  01/14/22 146 lb (66.2 kg)  01/10/22 145 lb 3.2 oz (65.9 kg)    General: Well developed, well nourished, NAD Lungs:Diminished in bilateral LL. No wheezes, rales, or rhonchi. Breathing is unlabored. Cardiovascular: RRR with S1 S2. No murmurs Extremities: No edema.  Neuro: Alert and oriented. No focal deficits. No facial asymmetry. MAE spontaneously. Psych: Responds to questions appropriately with normal affect.    ASSESSMENT/PLAN:    Persistent afib s/p LAAO: Underwent successful LAAO with a 20 mm Watchman FLX device by Dr.  Quentin Ore on 08/02/21 for paroxymal atrial fibrillation in the setting of hematuria, falls and skins tears. 45 day TEE demonstrated no leak or thrombosis therefore Eliquis was stopped and he was transitioned to Plavix 75mg  QD. He is now 6 month post procedure and is able to transition to ASA monotherapy. Follow up 6 months with myself (1 year post implant).     Medication Adjustments/Labs and Tests Ordered: Current medicines are reviewed at length with the patient today.  Concerns regarding medicines are outlined above.  No orders of the defined types were placed in this encounter.  Meds ordered this encounter  Medications   bisoprolol (ZEBETA) 5 MG tablet    Sig: Take 1 tablet (5 mg total) by mouth daily.    Dispense:  90 tablet    Refill:  3   aspirin EC 81 MG tablet    Sig: Take 1 tablet (81 mg total) by mouth daily. Swallow whole.    Dispense:  90 tablet    Refill:  3    Patient Instructions  Medication Instructions:  Your physician has recommended you make the following change in your medication:  START ASPIRIN 81 MG DAILY. *If you need a refill on your cardiac medications before your next appointment, please call your pharmacy*   Lab Work: NONE If you have labs (blood work) drawn today and  your tests are completely normal, you will receive your results only by: MyChart Message (if you have MyChart) OR A paper copy in the mail If you have any lab test that is abnormal or we need to change your treatment, we will call you to review the results.   Testing/Procedures: NONE   Follow-Up: At [facility], you and your health needs are our priority.  As part of our continuing mission to provide you with exceptional heart care, we have created designated Provider Care Teams.  These Care Teams include your primary Cardiologist (physician) and Advanced Practice Providers (APPs -  Physician Assistants and Nurse Practitioners) who all work together to provide you with the care you  need, when you need it.  We recommend signing up for the patient portal called "MyChart".  Sign up information is provided on this After Visit Summary.  MyChart is used to connect with patients for Virtual Visits (Telemedicine).  Patients are able to view lab/test results, encounter notes, upcoming appointments, etc.  Non-urgent messages can be sent to your provider as well.   To learn more about what you can do with MyChart, go to [email or web address].    Your next appointment:   6 month(s)  The format for your next appointment:   In Person  Provider:   Kathyrn Drown, NP{    Signed, Kathyrn Drown, NP  01/28/2022 11:33 AM    [place]

## 2022-01-28 NOTE — Patient Instructions (Signed)
Medication Instructions:  Your physician has recommended you make the following change in your medication:  START ASPIRIN 81 MG DAILY. *If you need a refill on your cardiac medications before your next appointment, please call your pharmacy*   Lab Work: NONE If you have labs (blood work) drawn today and your tests are completely normal, you will receive your results only by: [place] (if you have MyChart) OR A paper copy in the mail If you have any lab test that is abnormal or we need to change your treatment, we will call you to review the results.   Testing/Procedures: NONE   Follow-Up: At [facility], you and your health needs are our priority.  As part of our continuing mission to provide you with exceptional heart care, we have created designated Provider Care Teams.  These Care Teams include your primary Cardiologist (physician) and Advanced Practice Providers (APPs -  Physician Assistants and Nurse Practitioners) who all work together to provide you with the care you need, when you need it.  We recommend signing up for the patient portal called "MyChart".  Sign up information is provided on this After Visit Summary.  MyChart is used to connect with patients for Virtual Visits (Telemedicine).  Patients are able to view lab/test results, encounter notes, upcoming appointments, etc.  Non-urgent messages can be sent to your provider as well.   To learn more about what you can do with MyChart, go to [email or web address].    Your next appointment:   6 month(s)  The format for your next appointment:   In Person  Provider:   Kathyrn Drown, [facility]

## 2022-01-31 ENCOUNTER — Ambulatory Visit (INDEPENDENT_AMBULATORY_CARE_PROVIDER_SITE_OTHER): Payer: Medicare Other | Admitting: Family Medicine

## 2022-01-31 ENCOUNTER — Other Ambulatory Visit: Payer: Self-pay

## 2022-01-31 VITALS — BP 110/50 | HR 88 | Temp 97.2°F | Resp 18 | Ht 64.0 in | Wt 146.0 lb

## 2022-01-31 DIAGNOSIS — J9611 Chronic respiratory failure with hypoxia: Secondary | ICD-10-CM

## 2022-01-31 DIAGNOSIS — J690 Pneumonitis due to inhalation of food and vomit: Secondary | ICD-10-CM

## 2022-01-31 DIAGNOSIS — J449 Chronic obstructive pulmonary disease, unspecified: Secondary | ICD-10-CM | POA: Diagnosis not present

## 2022-01-31 DIAGNOSIS — Z9981 Dependence on supplemental oxygen: Secondary | ICD-10-CM

## 2022-01-31 MED ORDER — AMOXICILLIN-POT CLAVULANATE 875-125 MG PO TABS: 1.0000 | ORAL_TABLET | Freq: Two times a day (BID) | ORAL | 0 refills | Status: DC

## 2022-01-31 NOTE — Progress Notes (Signed)
Acute Office Visit  Subjective:    Patient ID: Paul [name], male    DOB: February 25, 1946, 76 y.o.   MRN: [id number]  Chief Complaint  Patient presents with   Cough    HPI: Patient is a 76 yo male with copd end stage, chronic respiratory failure with hypoxia on oxygen, who presents today for coughing since choked on breakfast yesterday. Paul [name] is very concerned about the possibility of aspiration. No fever, chills or sweats. Slight increase in shortness of breath. No chest pain. Green sputum.  Past Medical History:  Diagnosis Date   Anemia    Aortic atherosclerosis (HCC)    Atrial fibrillation (HCC)    Back pain    COPD (chronic obstructive pulmonary disease) ([place])    COVID-19 12/24/2020   Diabetes mellitus without complication (HCC)    Elevated PSA    GAD (generalized anxiety disorder)    GERD (gastroesophageal reflux disease)    High cholesterol    Hypertension    Lingular pneumonia 01/05/2021   See cxr 01/06/20 rx with zpak/ cefipime x 5 days and then developed purulent sputum again 01/12/21 so rx levcaquin 500 mg daily x 7 days then return for cxr before more    Lung nodule < 6cm on CT 05/29/2016   Osteoporosis    Oxygen deficiency    Pneumonia    January 2022   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 Quentin Ore with a 20 mm Watchman FLX device   Vertebral compression fracture ([place]) 05/29/2019   T8 compression fracture noted on CT scan 05/28/2019 inpatient with chronic steroid dependent COPD - rx calcitonin nasal spray rx per PCP     Past Surgical History:  Procedure Laterality Date   CATARACT EXTRACTION Right    IR KYPHO EA ADDL LEVEL THORACIC OR LUMBAR  11/28/2021   LEFT ATRIAL APPENDAGE OCCLUSION N/A 08/02/2021   Procedure: LEFT ATRIAL APPENDAGE OCCLUSION;  Surgeon: Sherren Mocha, MD;  Location: [facility];  Service: Cardiovascular;  Laterality: N/A;   SKIN SURGERY     shoulders and chest   TEE WITHOUT CARDIOVERSION N/A  08/02/2021   Procedure: TRANSESOPHAGEAL ECHOCARDIOGRAM (TEE);  Surgeon: Sherren Mocha, MD;  Location: [facility];  Service: Cardiovascular;  Laterality: N/A;   TEE WITHOUT CARDIOVERSION N/A 09/13/2021   Procedure: TRANSESOPHAGEAL ECHOCARDIOGRAM (TEE);  Surgeon: Sanda Klein, MD;  Location: [facility];  Service: Cardiovascular;  Laterality: N/A;    Family History  Problem Relation Age of Onset   Heart disease Brother    Heart disease Mother    Heart disease Father    Cancer Paternal Uncle     Social History   Socioeconomic History   Marital status: Married    Spouse name: Not on file   Number of children: 3   Years of education: Not on file   Highest education level: Not on file  Occupational History   Occupation: retired    Comment: truck driver  Tobacco Use   Smoking status: Former    Packs/day: 2.00    Years: 52.00    Pack years: 104.00    Types: Cigarettes    Quit date: 02/03/2009    Years since quitting: 13.0   Smokeless tobacco: Never   Tobacco comments:    Counseled to remain smoke free  Vaping Use   Vaping Use: Never used  Substance and Sexual Activity   Alcohol use: Not Currently    Alcohol/week: 0.0 standard  drinks    Comment: occ   Drug use: No   Sexual activity: Not on file  Other Topics Concern   Not on file  Social History Narrative   Originally from [facility]. Previously worked driving a Restaurant manager, fast food. No pets currently. No mold exposure. 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       wears sunscreen, brushes and flosses daily, see's dentist bi-annually, has smoke/carbon monoxide detectors, wears a seatbelt and practices gun safety      Social Determinants of Health   Financial Resource Strain: High Risk   Difficulty of Paying Living Expenses: Hard  Food Insecurity: Not on file  Transportation Needs: Not on file  Physical Activity: Not on file  Stress: Not on file  Social  Connections: Not on file  Intimate Partner Violence: Not on file    Outpatient Medications Prior to Visit  Medication Sig Dispense Refill   acetaminophen (TYLENOL) 325 MG tablet Take 650 mg by mouth every 6 (six) hours as needed for mild pain or fever.     albuterol (PROAIR HFA) 108 (90 Base) MCG/ACT inhaler Inhale 2 puffs into the lungs every 6 (six) hours as needed for wheezing or shortness of breath.      albuterol (PROVENTIL) (2.5 MG/3ML) 0.083% nebulizer solution Take 3 mLs (2.5 mg total) by nebulization in the morning and at bedtime. 75 mL 6   Ascorbic Acid (VITAMIN C) 1000 MG tablet Take 1,000 mg by mouth daily.     aspirin EC 81 MG tablet Take 1 tablet (81 mg total) by mouth daily. Swallow whole. 90 tablet 3   atorvastatin (LIPITOR) 40 MG tablet TAKE 1 TABLET BY MOUTH ONCE DAILY AFTER SUPPER 90 tablet 0   bisoprolol (ZEBETA) 5 MG tablet Take 1 tablet (5 mg total) by mouth daily. 90 tablet 3   Budeson-Glycopyrrol-Formoterol (BREZTRI AEROSPHERE) 160-9-4.8 MCG/ACT AERO Inhale 2 puffs into the lungs 2 (two) times daily. 32.1 g 3   calcium carbonate (OS-CAL) 600 MG TABS tablet Take 600 mg by mouth daily.     Cholecalciferol (VITAMIN D) 50 MCG (2000 UT) CAPS Take 2,000 Units by mouth daily.     denosumab (PROLIA) 60 MG/ML SOSY injection Inject 60 mg into the skin every 6 (six) months.     diltiazem (CARDIZEM) 60 MG tablet Take 1 tablet (60 mg total) by mouth 4 (four) times daily as needed. 30 tablet prn   ferrous sulfate 325 (65 FE) MG tablet Take 325 mg by mouth 2 (two) times daily with a meal.     furosemide (LASIX) 40 MG tablet Take 1 tablet (40 mg total) by mouth daily. 90 tablet 3   guaiFENesin (MUCINEX) 600 MG 12 hr tablet Take 600 mg by mouth 2 (two) times daily.     loratadine (CLARITIN) 10 MG tablet Take 10 mg by mouth in the morning.     Multiple Vitamin (MULTIVITAMIN WITH MINERALS) TABS tablet Take 1 tablet by mouth daily.     Niacinamide-Zn-Cu-Methfo-Se-Cr (NICOTINAMIDE PO) Take  1 tablet by mouth in the morning and at bedtime.     OXYGEN Inhale 2-2.5 L/min into the lungs See admin instructions. Inhale 2 L/min of oxygen into the lungs at bedtime and when ambulatory     pantoprazole (PROTONIX) 40 MG tablet Take 1 tablet by mouth once daily 90 tablet 3   predniSONE (DELTASONE) 10 MG tablet 4 tabs for 3 days, then 3 tabs for  3 days, 2 tabs for 3 days, then 1 tab for 3 days, then hold at 59m daily 30 tablet 0   predniSONE (DELTASONE) 20 MG tablet Take 2 tablets (40 mg total) by mouth daily with breakfast. 60 tablet 0   sertraline (ZOLOFT) 50 MG tablet Take 1 tablet (50 mg total) by mouth daily. 90 tablet 1   tiZANidine (ZANAFLEX) 2 MG tablet Take 1 tablet (2 mg total) by mouth every 6 (six) hours as needed for muscle spasms. 30 tablet 0   No facility-administered medications prior to visit.    Allergies  Allergen Reactions   Daliresp [Roflumilast] Diarrhea and Nausea And Vomiting   Levaquin [Levofloxacin] Palpitations    Made the B/P fluctuate and heart raced   Alendronate Anxiety and Other (See Comments)    Jittery/nervous    Review of Systems  Constitutional:  Negative for chills and fever.  HENT:  Negative for congestion, rhinorrhea and sore throat (from coughing.).   Respiratory:  Positive for cough and shortness of breath.   Cardiovascular:  Negative for chest pain and palpitations.  Gastrointestinal:  Negative for abdominal pain, constipation, diarrhea, nausea and vomiting.  Genitourinary:  Negative for dysuria and urgency.  Musculoskeletal:  Negative for arthralgias, back pain and myalgias.  Neurological:  Negative for dizziness and headaches.  Psychiatric/Behavioral:  Negative for dysphoric mood. The patient is not nervous/anxious.       Objective:    Physical Exam Vitals reviewed.  Constitutional:      Appearance: Normal appearance.  Neck:     Vascular: No carotid bruit.  Cardiovascular:     Rate and Rhythm: Normal rate and regular rhythm.      Heart sounds: Normal heart sounds.  Pulmonary:     Effort: Pulmonary effort is normal.     Breath sounds: Wheezing (rul) present. No rhonchi or rales.  Neurological:     Mental Status: Paul [name] is alert.  Psychiatric:        Mood and Affect: Mood normal.        Behavior: Behavior normal.    BP (!) 110/50    Pulse 88    Temp (!) 97.2 F (36.2 C)    Resp 18    Ht 5' 4" (1.626 m)    Wt 146 lb (66.2 kg)    BMI 25.06 kg/m  Wt Readings from Last 3 Encounters:  01/31/22 146 lb (66.2 kg)  01/28/22 146 lb (66.2 kg)  01/14/22 146 lb (66.2 kg)    Health Maintenance Due  Topic Date Due   OPHTHALMOLOGY EXAM  Never done   Hepatitis C Screening  Never done   TETANUS/TDAP  Never done   Zoster Vaccines- Shingrix (2 of 2) 05/30/2014    There are no preventive care reminders to display for this patient.   Lab Results  Component Value Date   TSH 2.539 03/22/2019   Lab Results  Component Value Date   WBC 16.1 (H) 01/14/2022   HGB 12.6 (L) 01/14/2022   HCT 39.0 01/14/2022   MCV 80 01/14/2022   PLT 301 01/14/2022   Lab Results  Component Value Date   NA 143 01/14/2022   K 4.6 01/14/2022   CO2 30 (H) 01/14/2022   GLUCOSE 109 (H) 01/14/2022   BUN 15 01/14/2022   CREATININE 0.80 01/14/2022   BILITOT 0.3 01/14/2022   ALKPHOS 87 01/14/2022   AST 27 01/14/2022   ALT 40 01/14/2022   PROT 6.2 01/14/2022   ALBUMIN 4.2 01/14/2022   CALCIUM  9.0 01/14/2022   ANIONGAP 7 01/03/2022   EGFR 92 01/14/2022   Lab Results  Component Value Date   CHOL 144 10/05/2021   Lab Results  Component Value Date   HDL 44 10/05/2021   Lab Results  Component Value Date   LDLCALC 70 10/05/2021   Lab Results  Component Value Date   TRIG 180 (H) 10/05/2021   Lab Results  Component Value Date   CHOLHDL 3.3 10/05/2021   Lab Results  Component Value Date   HGBA1C 6.5 (H) 12/27/2021       Assessment & Plan:   Problem List Items Addressed This Visit       Respiratory   COPD GOLD  III     The  current medical regimen is effective;  continue present plan and medications. Already tapering off prednisone.       Chronic respiratory failure with hypoxia (HCC)    Continue oxygen 2 L.      Aspiration pneumonia due to inhalation of vomitus ([place]) - Primary    Rx: augmentin      Relevant Medications   amoxicillin-clavulanate (AUGMENTIN) 875-125 MG tablet     Other   Oxygen dependent    Continue oxygen 2 L.      Meds ordered this encounter  Medications   amoxicillin-clavulanate (AUGMENTIN) 875-125 MG tablet    Sig: Take 1 tablet by mouth 2 (two) times daily.    Dispense:  20 tablet    Refill:  0      Follow-up: Return if symptoms worsen or fail to improve.  An After Visit Summary was printed and given to the patient.  Paul Brome, MD [facility] [phone]

## 2022-02-03 ENCOUNTER — Encounter: Payer: Self-pay | Admitting: Family Medicine

## 2022-02-03 DIAGNOSIS — J690 Pneumonitis due to inhalation of food and vomit: Secondary | ICD-10-CM | POA: Insufficient documentation

## 2022-02-03 HISTORY — DX: Pneumonitis due to inhalation of food and vomit: J69.0

## 2022-02-03 NOTE — Assessment & Plan Note (Signed)
Rx augmentin.  

## 2022-02-03 NOTE — Assessment & Plan Note (Signed)
Continue oxygen 2 L. 

## 2022-02-03 NOTE — Assessment & Plan Note (Signed)
The current medical regimen is effective;  continue present plan and medications. Already tapering off prednisone.

## 2022-02-04 ENCOUNTER — Encounter (HOSPITAL_BASED_OUTPATIENT_CLINIC_OR_DEPARTMENT_OTHER): Payer: Self-pay | Admitting: *Deleted

## 2022-02-04 ENCOUNTER — Emergency Department (HOSPITAL_BASED_OUTPATIENT_CLINIC_OR_DEPARTMENT_OTHER): Payer: Medicare Other

## 2022-02-04 ENCOUNTER — Other Ambulatory Visit: Payer: Self-pay

## 2022-02-04 ENCOUNTER — Inpatient Hospital Stay (HOSPITAL_BASED_OUTPATIENT_CLINIC_OR_DEPARTMENT_OTHER)
Admission: EM | Admit: 2022-02-04 | Discharge: 2022-02-08 | DRG: 190 | Disposition: A | Discharge status: Home / Self Care | Payer: Medicare Other | Attending: Internal Medicine | Admitting: Internal Medicine

## 2022-02-04 DIAGNOSIS — I5032 Chronic diastolic (congestive) heart failure: Secondary | ICD-10-CM | POA: Diagnosis not present

## 2022-02-04 DIAGNOSIS — F411 Generalized anxiety disorder: Secondary | ICD-10-CM | POA: Diagnosis present

## 2022-02-04 DIAGNOSIS — I10 Essential (primary) hypertension: Secondary | ICD-10-CM | POA: Diagnosis present

## 2022-02-04 DIAGNOSIS — J9621 Acute and chronic respiratory failure with hypoxia: Secondary | ICD-10-CM | POA: Diagnosis not present

## 2022-02-04 DIAGNOSIS — R0602 Shortness of breath: Secondary | ICD-10-CM | POA: Diagnosis not present

## 2022-02-04 DIAGNOSIS — J449 Chronic obstructive pulmonary disease, unspecified: Secondary | ICD-10-CM | POA: Diagnosis present

## 2022-02-04 DIAGNOSIS — I110 Hypertensive heart disease with heart failure: Secondary | ICD-10-CM | POA: Diagnosis present

## 2022-02-04 DIAGNOSIS — Z7952 Long term (current) use of systemic steroids: Secondary | ICD-10-CM

## 2022-02-04 DIAGNOSIS — Z8616 Personal history of COVID-19: Secondary | ICD-10-CM

## 2022-02-04 DIAGNOSIS — Z9981 Dependence on supplemental oxygen: Secondary | ICD-10-CM | POA: Diagnosis not present

## 2022-02-04 DIAGNOSIS — D72829 Elevated white blood cell count, unspecified: Secondary | ICD-10-CM | POA: Diagnosis not present

## 2022-02-04 DIAGNOSIS — Z7951 Long term (current) use of inhaled steroids: Secondary | ICD-10-CM

## 2022-02-04 DIAGNOSIS — E1165 Type 2 diabetes mellitus with hyperglycemia: Secondary | ICD-10-CM | POA: Diagnosis not present

## 2022-02-04 DIAGNOSIS — J441 Chronic obstructive pulmonary disease with (acute) exacerbation: Principal | ICD-10-CM | POA: Diagnosis present

## 2022-02-04 DIAGNOSIS — M810 Age-related osteoporosis without current pathological fracture: Secondary | ICD-10-CM | POA: Diagnosis present

## 2022-02-04 DIAGNOSIS — I480 Paroxysmal atrial fibrillation: Secondary | ICD-10-CM | POA: Diagnosis not present

## 2022-02-04 DIAGNOSIS — Z87891 Personal history of nicotine dependence: Secondary | ICD-10-CM

## 2022-02-04 DIAGNOSIS — E785 Hyperlipidemia, unspecified: Secondary | ICD-10-CM | POA: Diagnosis not present

## 2022-02-04 DIAGNOSIS — E1159 Type 2 diabetes mellitus with other circulatory complications: Secondary | ICD-10-CM | POA: Diagnosis present

## 2022-02-04 DIAGNOSIS — Z881 Allergy status to other antibiotic agents status: Secondary | ICD-10-CM | POA: Diagnosis not present

## 2022-02-04 DIAGNOSIS — Z8701 Personal history of pneumonia (recurrent): Secondary | ICD-10-CM | POA: Diagnosis not present

## 2022-02-04 DIAGNOSIS — Z95818 Presence of other cardiac implants and grafts: Secondary | ICD-10-CM | POA: Diagnosis not present

## 2022-02-04 DIAGNOSIS — K219 Gastro-esophageal reflux disease without esophagitis: Secondary | ICD-10-CM | POA: Diagnosis present

## 2022-02-04 DIAGNOSIS — R059 Cough, unspecified: Secondary | ICD-10-CM | POA: Diagnosis not present

## 2022-02-04 DIAGNOSIS — E782 Mixed hyperlipidemia: Secondary | ICD-10-CM | POA: Diagnosis present

## 2022-02-04 DIAGNOSIS — Z79899 Other long term (current) drug therapy: Secondary | ICD-10-CM

## 2022-02-04 DIAGNOSIS — B974 Respiratory syncytial virus as the cause of diseases classified elsewhere: Secondary | ICD-10-CM | POA: Diagnosis present

## 2022-02-04 DIAGNOSIS — Z8249 Family history of ischemic heart disease and other diseases of the circulatory system: Secondary | ICD-10-CM | POA: Diagnosis not present

## 2022-02-04 DIAGNOSIS — R509 Fever, unspecified: Secondary | ICD-10-CM | POA: Diagnosis not present

## 2022-02-04 DIAGNOSIS — T380X5A Adverse effect of glucocorticoids and synthetic analogues, initial encounter: Secondary | ICD-10-CM | POA: Diagnosis not present

## 2022-02-04 DIAGNOSIS — Z20822 Contact with and (suspected) exposure to covid-19: Secondary | ICD-10-CM | POA: Diagnosis present

## 2022-02-04 DIAGNOSIS — Z888 Allergy status to other drugs, medicaments and biological substances status: Secondary | ICD-10-CM

## 2022-02-04 DIAGNOSIS — B9789 Other viral agents as the cause of diseases classified elsewhere: Secondary | ICD-10-CM | POA: Diagnosis present

## 2022-02-04 DIAGNOSIS — R0600 Dyspnea, unspecified: Secondary | ICD-10-CM

## 2022-02-04 DIAGNOSIS — Z7982 Long term (current) use of aspirin: Secondary | ICD-10-CM

## 2022-02-04 DIAGNOSIS — J690 Pneumonitis due to inhalation of food and vomit: Secondary | ICD-10-CM | POA: Diagnosis not present

## 2022-02-04 DIAGNOSIS — E1169 Type 2 diabetes mellitus with other specified complication: Secondary | ICD-10-CM | POA: Diagnosis not present

## 2022-02-04 DIAGNOSIS — I152 Hypertension secondary to endocrine disorders: Secondary | ICD-10-CM | POA: Diagnosis present

## 2022-02-04 DIAGNOSIS — J189 Pneumonia, unspecified organism: Secondary | ICD-10-CM | POA: Diagnosis not present

## 2022-02-04 LAB — GLUCOSE, CAPILLARY: Glucose-Capillary: 169 mg/dL — ABNORMAL HIGH (ref 70–99)

## 2022-02-04 LAB — BASIC METABOLIC PANEL
Anion gap: 12 (ref 5–15)
BUN: 15 mg/dL (ref 8–23)
CO2: 29 mmol/L (ref 22–32)
Calcium: 9.1 mg/dL (ref 8.9–10.3)
Chloride: 94 mmol/L — ABNORMAL LOW (ref 98–111)
Creatinine, Ser: 0.86 mg/dL (ref 0.61–1.24)
GFR, Estimated: >60 mL/min (ref >60)
Glucose, Bld: 104 mg/dL — ABNORMAL HIGH (ref 70–99)
Potassium: 3.8 mmol/L (ref 3.5–5.1)
Sodium: 135 mmol/L (ref 135–145)

## 2022-02-04 LAB — TROPONIN I (HIGH SENSITIVITY)
Troponin I (High Sensitivity): 5 ng/L (ref <18)
Troponin I (High Sensitivity): 4 ng/L (ref <18)

## 2022-02-04 LAB — CBC WITH DIFFERENTIAL/PLATELET
Abs Immature Granulocytes: 0.08 10*3/uL — ABNORMAL HIGH (ref 0.00–0.07)
Basophils Absolute: 0 10*3/uL (ref 0.0–0.1)
Basophils Relative: 0 %
Eosinophils Absolute: 0.1 10*3/uL (ref 0.0–0.5)
Eosinophils Relative: 0 %
HCT: 38.2 % — ABNORMAL LOW (ref 39.0–52.0)
Hemoglobin: 12 g/dL — ABNORMAL LOW (ref 13.0–17.0)
Immature Granulocytes: 1 %
Lymphocytes Relative: 4 %
Lymphs Abs: 0.7 10*3/uL (ref 0.7–4.0)
MCH: 25.5 pg — ABNORMAL LOW (ref 26.0–34.0)
MCHC: 31.4 g/dL (ref 30.0–36.0)
MCV: 81.1 fL (ref 80.0–100.0)
Monocytes Absolute: 1 10*3/uL (ref 0.1–1.0)
Monocytes Relative: 6 %
Neutro Abs: 14.5 10*3/uL — ABNORMAL HIGH (ref 1.7–7.7)
Neutrophils Relative %: 89 %
Platelets: 327 10*3/uL (ref 150–400)
RBC: 4.71 MIL/uL (ref 4.22–5.81)
RDW: 16.8 % — ABNORMAL HIGH (ref 11.5–15.5)
WBC: 16.4 10*3/uL — ABNORMAL HIGH (ref 4.0–10.5)
nRBC: 0 % (ref 0.0–0.2)

## 2022-02-04 LAB — BRAIN NATRIURETIC PEPTIDE: B Natriuretic Peptide: 120.4 pg/mL — ABNORMAL HIGH (ref 0.0–100.0)

## 2022-02-04 LAB — RESP PANEL BY RT-PCR (FLU A&B, COVID) ARPGX2
Influenza A by PCR: NEGATIVE
Influenza B by PCR: NEGATIVE
SARS Coronavirus 2 by RT PCR: NEGATIVE

## 2022-02-04 MED ORDER — METHYLPREDNISOLONE SODIUM SUCC 125 MG IJ SOLR
125.0000 mg | Freq: Once | INTRAMUSCULAR | Status: AC
  Administered 2022-02-04: 125 mg via INTRAVENOUS
  Filled 2022-02-04: qty 2

## 2022-02-04 MED ORDER — PANTOPRAZOLE SODIUM 40 MG PO TBEC
40.0000 mg | DELAYED_RELEASE_TABLET | Freq: Every day | ORAL | Status: DC
  Administered 2022-02-05 – 2022-02-08 (×4): 40 mg via ORAL
  Filled 2022-02-04 (×4): qty 1

## 2022-02-04 MED ORDER — MAGNESIUM SULFATE 2 GM/50ML IV SOLN
2.0000 g | Freq: Once | INTRAVENOUS | Status: AC
  Administered 2022-02-04: 2 g via INTRAVENOUS
  Filled 2022-02-04: qty 50

## 2022-02-04 MED ORDER — ASPIRIN EC 81 MG PO TBEC
81.0000 mg | DELAYED_RELEASE_TABLET | Freq: Every day | ORAL | Status: DC
  Administered 2022-02-05 – 2022-02-08 (×4): 81 mg via ORAL
  Filled 2022-02-04 (×4): qty 1

## 2022-02-04 MED ORDER — FLUTICASONE FUROATE-VILANTEROL 100-25 MCG/ACT IN AEPB
1.0000 | INHALATION_SPRAY | Freq: Every day | RESPIRATORY_TRACT | Status: DC
  Administered 2022-02-05 – 2022-02-08 (×4): 1 via RESPIRATORY_TRACT
  Filled 2022-02-04: qty 28

## 2022-02-04 MED ORDER — SODIUM CHLORIDE 0.9% FLUSH
3.0000 mL | Freq: Two times a day (BID) | INTRAVENOUS | Status: DC
  Administered 2022-02-04 – 2022-02-08 (×8): 3 mL via INTRAVENOUS

## 2022-02-04 MED ORDER — PREDNISONE 20 MG PO TABS
40.0000 mg | ORAL_TABLET | Freq: Every day | ORAL | Status: DC
  Administered 2022-02-05 – 2022-02-06 (×2): 40 mg via ORAL
  Filled 2022-02-04 (×2): qty 2

## 2022-02-04 MED ORDER — SERTRALINE HCL 50 MG PO TABS
50.0000 mg | ORAL_TABLET | Freq: Every day | ORAL | Status: DC
  Administered 2022-02-05 – 2022-02-08 (×4): 50 mg via ORAL
  Filled 2022-02-04 (×4): qty 1

## 2022-02-04 MED ORDER — POTASSIUM CHLORIDE CRYS ER 20 MEQ PO TBCR
20.0000 meq | EXTENDED_RELEASE_TABLET | Freq: Once | ORAL | Status: AC
  Administered 2022-02-04: 20 meq via ORAL
  Filled 2022-02-04: qty 1

## 2022-02-04 MED ORDER — IPRATROPIUM-ALBUTEROL 0.5-2.5 (3) MG/3ML IN SOLN
3.0000 mL | RESPIRATORY_TRACT | Status: DC | PRN
  Administered 2022-02-04 (×2): 3 mL via RESPIRATORY_TRACT
  Filled 2022-02-04 (×2): qty 3

## 2022-02-04 MED ORDER — IPRATROPIUM-ALBUTEROL 0.5-2.5 (3) MG/3ML IN SOLN
3.0000 mL | Freq: Four times a day (QID) | RESPIRATORY_TRACT | Status: DC
  Administered 2022-02-04 – 2022-02-05 (×5): 3 mL via RESPIRATORY_TRACT
  Filled 2022-02-04 (×5): qty 3

## 2022-02-04 MED ORDER — AZITHROMYCIN 250 MG PO TABS
250.0000 mg | ORAL_TABLET | Freq: Every day | ORAL | Status: DC
  Administered 2022-02-05 – 2022-02-06 (×2): 250 mg via ORAL
  Filled 2022-02-04 (×2): qty 1

## 2022-02-04 MED ORDER — ALBUTEROL (5 MG/ML) CONTINUOUS INHALATION SOLN
10.0000 mg/h | INHALATION_SOLUTION | RESPIRATORY_TRACT | Status: DC
  Administered 2022-02-04: 10 mg/h via RESPIRATORY_TRACT
  Filled 2022-02-04: qty 20

## 2022-02-04 MED ORDER — ALBUTEROL SULFATE (2.5 MG/3ML) 0.083% IN NEBU
2.5000 mg | INHALATION_SOLUTION | RESPIRATORY_TRACT | Status: DC | PRN
  Administered 2022-02-06: 2.5 mg via RESPIRATORY_TRACT
  Filled 2022-02-04: qty 3

## 2022-02-04 MED ORDER — ACETAMINOPHEN 325 MG PO TABS: 650.0000 mg | ORAL_TABLET | Freq: Four times a day (QID) | ORAL | Status: DC | PRN

## 2022-02-04 MED ORDER — SODIUM CHLORIDE 0.9 % IV SOLN: INTRAVENOUS | Status: DC | PRN

## 2022-02-04 MED ORDER — ENOXAPARIN SODIUM 40 MG/0.4ML IJ SOSY
40.0000 mg | PREFILLED_SYRINGE | INTRAMUSCULAR | Status: DC
  Administered 2022-02-04 – 2022-02-07 (×4): 40 mg via SUBCUTANEOUS
  Filled 2022-02-04 (×4): qty 0.4

## 2022-02-04 MED ORDER — ATORVASTATIN CALCIUM 40 MG PO TABS
40.0000 mg | ORAL_TABLET | Freq: Every day | ORAL | Status: DC
  Administered 2022-02-05 – 2022-02-08 (×4): 40 mg via ORAL
  Filled 2022-02-04 (×4): qty 1

## 2022-02-04 MED ORDER — BISOPROLOL FUMARATE 5 MG PO TABS
5.0000 mg | ORAL_TABLET | Freq: Every day | ORAL | Status: DC
  Administered 2022-02-05 – 2022-02-08 (×4): 5 mg via ORAL
  Filled 2022-02-04 (×4): qty 1

## 2022-02-04 MED ORDER — UMECLIDINIUM BROMIDE 62.5 MCG/ACT IN AEPB
1.0000 | INHALATION_SPRAY | Freq: Every day | RESPIRATORY_TRACT | Status: DC
  Administered 2022-02-05 – 2022-02-08 (×4): 1 via RESPIRATORY_TRACT
  Filled 2022-02-04 (×2): qty 7

## 2022-02-04 MED ORDER — BUDESON-GLYCOPYRROL-FORMOTEROL 160-9-4.8 MCG/ACT IN AERO: 2.0000 | INHALATION_SPRAY | Freq: Two times a day (BID) | RESPIRATORY_TRACT | Status: DC

## 2022-02-04 MED ORDER — POLYETHYLENE GLYCOL 3350 17 G PO PACK: 17.0000 g | PACK | Freq: Every day | ORAL | Status: DC | PRN

## 2022-02-04 MED ORDER — AZITHROMYCIN 250 MG PO TABS
500.0000 mg | ORAL_TABLET | Freq: Every day | ORAL | Status: AC
  Administered 2022-02-04: 500 mg via ORAL
  Filled 2022-02-04: qty 2

## 2022-02-04 MED ORDER — ACETAMINOPHEN 650 MG RE SUPP: 650.0000 mg | Freq: Four times a day (QID) | RECTAL | Status: DC | PRN

## 2022-02-04 MED ORDER — IPRATROPIUM BROMIDE 0.02 % IN SOLN
0.5000 mg | Freq: Once | RESPIRATORY_TRACT | Status: AC
  Administered 2022-02-04: 0.5 mg via RESPIRATORY_TRACT
  Filled 2022-02-04: qty 2.5

## 2022-02-04 MED ORDER — FUROSEMIDE 40 MG PO TABS
40.0000 mg | ORAL_TABLET | Freq: Every day | ORAL | Status: DC
  Administered 2022-02-05 – 2022-02-08 (×4): 40 mg via ORAL
  Filled 2022-02-04 (×4): qty 1

## 2022-02-04 NOTE — ED Notes (Signed)
Placed on 1lpm [place]

## 2022-02-04 NOTE — H&P (Signed)
History and Physical   Paul [name]:[id number] DOB: [birth year] DOA: 02/04/2022  PCP: [name], MD   Patient coming from: Home  Chief Complaint: Shortness of breath  HPI: Paul [name] is a 76 y.o. male with medical history significant of COPD, A-fib, hyperlipidemia, GERD, anxiety, hypertension, diabetes, CHF, chronic use of home oxygen who presents with shortness of breath.  Patient has had ongoing increased shortness of breath for past 4 to 5 days. He states that he has started using his home oxygen during the day as well as at night, he typically only uses it at night.  He did this due to increased shortness of breath.  He has had some associated increase in cough.  He saw his PCP was concerned that he may have choked on something, as he had something that seemed to go down the wrong way while swallowing and that he coughed for almost 20 minutes afterwards and has had some persistent cough since.  And was started on treatment for possible aspiration pneumonia with Augmentin.  No improvement with Augmentin.  He does report increased sputum production and greenish sputum.  He denies fevers, chills, chest pain, abdominal pain, constipation, diarrhea, nausea, vomiting.  ED Course: Vital signs in the ED significant for respiratory rate in the 20s on 1 to 2 L of supplemental oxygen.  Heart rate in the 90s to 110s, blood pressure in the 242A to 834H systolic.  Lab work-up showed BMP with chloride 94, glucose 104.  CBC showed leukocytosis to 16.  Troponin negative x2.  BNP mildly elevated at 120.  Respiratory panel for flu and COVID-negative.  Chest x-ray showed no acute abnormality.  Patient received Solu-Medrol, magnesium, potassium, albuterol, DuoNeb, Atrovent ED.  Review of Systems: As per HPI otherwise all other systems reviewed and are negative.  Past Medical History:  Diagnosis Date   Anemia    Aortic atherosclerosis (HCC)    Aspiration pneumonia due to inhalation of vomitus ([place])  02/03/2022   Atrial fibrillation (HCC)    Back pain    COPD (chronic obstructive pulmonary disease) (HCC)    COPD exacerbation ([place]) 12/27/2021   COVID-19 12/24/2020   Diabetes mellitus without complication (HCC)    Elevated PSA    GAD (generalized anxiety disorder)    GERD (gastroesophageal reflux disease)    High cholesterol    Hypertension    Lingular pneumonia 01/05/2021   See cxr 01/06/20 rx with zpak/ cefipime x 5 days and then developed purulent sputum again 01/12/21 so rx levcaquin 500 mg daily x 7 days then return for cxr before more    Lung nodule < 6cm on CT 05/29/2016   Osteoporosis    Oxygen deficiency    Pneumonia    January 2022   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 Quentin Ore with a 20 mm Watchman FLX device   Vertebral compression fracture ([place]) 05/29/2019   T8 compression fracture noted on CT scan 05/28/2019 inpatient with chronic steroid dependent COPD - rx calcitonin nasal spray rx per PCP     Past Surgical History:  Procedure Laterality Date   CATARACT EXTRACTION Right    IR KYPHO EA ADDL LEVEL THORACIC OR LUMBAR  11/28/2021   LEFT ATRIAL APPENDAGE OCCLUSION N/A 08/02/2021   Procedure: LEFT ATRIAL APPENDAGE OCCLUSION;  Surgeon: Sherren Mocha, MD;  Location: [facility];  Service: Cardiovascular;  Laterality: N/A;   SKIN SURGERY     shoulders and chest  TEE WITHOUT CARDIOVERSION N/A 08/02/2021   Procedure: TRANSESOPHAGEAL ECHOCARDIOGRAM (TEE);  Surgeon: Sherren Mocha, MD;  Location: [facility];  Service: Cardiovascular;  Laterality: N/A;   TEE WITHOUT CARDIOVERSION N/A 09/13/2021   Procedure: TRANSESOPHAGEAL ECHOCARDIOGRAM (TEE);  Surgeon: Sanda Klein, MD;  Location: [facility] ENDOSCOPY;  Service: Cardiovascular;  Laterality: N/A;    Social History  reports that he quit smoking about 13 years ago. His smoking use included cigarettes. He has a 104.00 pack-year smoking history. He has never used smokeless  tobacco. He reports that he does not currently use alcohol. He reports that he does not use drugs.  Allergies  Allergen Reactions   Daliresp [Roflumilast] Diarrhea and Nausea And Vomiting   Levaquin [Levofloxacin] Palpitations    Made the B/P fluctuate and heart raced   Alendronate Anxiety and Other (See Comments)    Jittery/nervous    Family History  Problem Relation Age of Onset   Heart disease Brother    Heart disease Mother    Heart disease Father    Cancer Paternal Uncle   Reviewed on admission  Prior to Admission medications   Medication Sig Start Date End Date Taking? Authorizing Provider  acetaminophen (TYLENOL) 325 MG tablet Take 650 mg by mouth every 6 (six) hours as needed for mild pain or fever.    [provider]  albuterol (PROAIR HFA) 108 (90 Base) MCG/ACT inhaler Inhale 2 puffs into the lungs every 6 (six) hours as needed for wheezing or shortness of breath.     [provider]  albuterol (PROVENTIL) (2.5 MG/3ML) 0.083% nebulizer solution Take 3 mLs (2.5 mg total) by nebulization in the morning and at bedtime. 05/09/20   Parrett, Fonnie Mu, NP  amoxicillin-clavulanate (AUGMENTIN) 875-125 MG tablet Take 1 tablet by mouth 2 (two) times daily. 01/31/22   CoxElnita Maxwell, MD  Ascorbic Acid (VITAMIN C) 1000 MG tablet Take 1,000 mg by mouth daily.    [provider]  aspirin EC 81 MG tablet Take 1 tablet (81 mg total) by mouth daily. Swallow whole. 01/28/22   Tommie Raymond, NP  atorvastatin (LIPITOR) 40 MG tablet TAKE 1 TABLET BY MOUTH ONCE DAILY AFTER SUPPER 11/29/21   Cox, Elnita Maxwell, MD  bisoprolol (ZEBETA) 5 MG tablet Take 1 tablet (5 mg total) by mouth daily. 01/28/22   Kathyrn Drown D, NP  Budeson-Glycopyrrol-Formoterol (BREZTRI AEROSPHERE) 160-9-4.8 MCG/ACT AERO Inhale 2 puffs into the lungs 2 (two) times daily. 03/26/21   Cox, Elnita Maxwell, MD  calcium carbonate (OS-CAL) 600 MG TABS tablet Take 600 mg by mouth daily.    [provider]   Cholecalciferol (VITAMIN D) 50 MCG (2000 UT) CAPS Take 2,000 Units by mouth daily.    [provider]  denosumab (PROLIA) 60 MG/ML SOSY injection Inject 60 mg into the skin every 6 (six) months.    [provider]  diltiazem (CARDIZEM) 60 MG tablet Take 1 tablet (60 mg total) by mouth 4 (four) times daily as needed. 01/19/21   Jerline Pain, MD  ferrous sulfate 325 (65 FE) MG tablet Take 325 mg by mouth 2 (two) times daily with a meal.    [provider]  furosemide (LASIX) 40 MG tablet Take 1 tablet (40 mg total) by mouth daily. 01/19/21   Jerline Pain, MD  guaiFENesin (MUCINEX) 600 MG 12 hr tablet Take 600 mg by mouth 2 (two) times daily.    [provider]  loratadine (CLARITIN) 10 MG tablet Take 10 mg by mouth in  the morning.    [provider]  Multiple Vitamin (MULTIVITAMIN WITH MINERALS) TABS tablet Take 1 tablet by mouth daily.    [provider]  Niacinamide-Zn-Cu-Methfo-Se-Cr (NICOTINAMIDE PO) Take 1 tablet by mouth in the morning and at bedtime.    [provider]  OXYGEN Inhale 2-2.5 L/min into the lungs See admin instructions. Inhale 2 L/min of oxygen into the lungs at bedtime and when ambulatory    [provider]  pantoprazole (PROTONIX) 40 MG tablet Take 1 tablet by mouth once daily 12/12/21   Jerline Pain, MD  predniSONE (DELTASONE) 10 MG tablet 4 tabs for 3 days, then 3 tabs for 3 days, 2 tabs for 3 days, then 1 tab for 3 days, then hold at 5mg  daily 01/10/22   Parrett, Fonnie Mu, NP  sertraline (ZOLOFT) 50 MG tablet Take 1 tablet (50 mg total) by mouth daily. 12/31/21   Cox, Elnita Maxwell, MD  tiZANidine (ZANAFLEX) 2 MG tablet Take 1 tablet (2 mg total) by mouth every 6 (six) hours as needed for muscle spasms. 11/12/21   [name], MD    Physical Exam: Vitals:   02/04/22 1630 02/04/22 1645 02/04/22 1707 02/04/22 1803  BP: 131/65 119/62  126/61  Pulse: 88 87  99  Resp: (!) 25 (!) 25  (!) 22  Temp:    97.8 F  (36.6 C)  TempSrc:    Oral  SpO2: 97% 96% 96% 99%  Weight:      Height:        Physical Exam Constitutional:      General: He is not in acute distress.    Appearance: Normal appearance.  HENT:     Head: Normocephalic and atraumatic.     Mouth/Throat:     Mouth: Mucous membranes are moist.     Pharynx: Oropharynx is clear.  Eyes:     Extraocular Movements: Extraocular movements intact.     Pupils: Pupils are equal, round, and reactive to light.  Cardiovascular:     Rate and Rhythm: Normal rate and regular rhythm.     Pulses: Normal pulses.     Heart sounds: Normal heart sounds.  Pulmonary:     Effort: Pulmonary effort is normal. No respiratory distress.     Breath sounds: Wheezing present.  Abdominal:     General: Bowel sounds are normal. There is no distension.     Palpations: Abdomen is soft.     Tenderness: There is no abdominal tenderness.  Musculoskeletal:        General: No swelling or deformity.  Skin:    General: Skin is warm and dry.  Neurological:     General: No focal deficit present.     Mental Status: Mental status is at baseline.   Labs on Admission: I have personally reviewed following labs and imaging studies  CBC: Recent Labs  Lab 02/04/22 1031  WBC 16.4*  NEUTROABS 14.5*  HGB 12.0*  HCT 38.2*  MCV 81.1  PLT 262    Basic Metabolic Panel: Recent Labs  Lab 02/04/22 1031  NA 135  K 3.8  CL 94*  CO2 29  GLUCOSE 104*  BUN 15  CREATININE 0.86  CALCIUM 9.1    GFR: Estimated Creatinine Clearance: 61.2 mL/min (by C-G formula based on SCr of 0.86 mg/dL).  Liver Function Tests: No results for input(s): AST, ALT, ALKPHOS, BILITOT, PROT, ALBUMIN in the last 168 hours.  Urine analysis:    Component Value Date/Time   COLORURINE YELLOW 01/14/2021 1100  APPEARANCEUR CLEAR 01/14/2021 1100   LABSPEC 1.015 01/14/2021 1100   PHURINE 7.5 01/14/2021 1100   GLUCOSEU NEGATIVE 01/14/2021 1100   HGBUR MODERATE (A) 01/14/2021 1100   BILIRUBINUR  Negative 05/10/2021 0931   KETONESUR NEGATIVE 01/14/2021 1100   PROTEINUR Positive (A) 05/10/2021 0931   PROTEINUR NEGATIVE 01/14/2021 1100   UROBILINOGEN negative (A) 05/10/2021 0931   NITRITE Negative 05/10/2021 0931   NITRITE NEGATIVE 01/14/2021 1100   LEUKOCYTESUR Negative 05/10/2021 0931   LEUKOCYTESUR NEGATIVE 01/14/2021 1100    Radiological Exams on Admission: DG Chest Portable 1 View  Result Date: 02/04/2022 CLINICAL DATA:  Cough and fever. EXAM: PORTABLE CHEST 1 VIEW COMPARISON:  12/28/2021 FINDINGS: 1211 hours. The lungs are clear without focal pneumonia, edema, pneumothorax or pleural effusion. Probable tiny granuloma right upper lobe stable since 01/30/2021. The cardiopericardial silhouette is within normal limits for size. The visualized bony structures of the thorax show no acute abnormality. Telemetry leads overlie the chest. IMPRESSION: No active disease. Electronically Signed   By: Misty Stanley M.D.   On: 02/04/2022 12:21    EKG: Independently reviewed.  Sinus rhythm at 99 bpm.  Baseline wander.  Assessment/Plan Principal Problem:   COPD with acute exacerbation (HCC) Active Problems:   HLD (hyperlipidemia)   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HCC)   Essential hypertension   Chronic diastolic CHF (congestive heart failure) (HCC)   Paroxysmal atrial fibrillation (HCC)   Oxygen dependent   GERD (gastroesophageal reflux disease) / GI protection on antiplatelet therapy   COPD exacerbation > Patient presenting with diffuse wheezing and increased shortness of breath and cough. > History of COPD had to increase his home oxygen due to shortness of breath.  Reportedly was borderline hypoxic in the ED but no recorded hypoxic episodes.  He is on 1 to 2 L currently. (Chronically on 2 L in the evening at home). > Continue to have symptoms despite multiple nebulizers, Solu-Medrol, magnesium in the ED.  Wheezing on exam. - Monitor on telemetry - Continue with daily  prednisone (has been on a taper prior to admission, so may need extended taper) - Scheduled DuoNebs - As needed albuterol - Check respiratory viral panel - Wean oxygen as tolerated - Continue home Breztri - Azithromycin given sputum changes  Leukocytosis > Noted to have leukocytosis to 16.4 in the ED.  Chest x-ray clear and no urinary symptoms.  Suspected to be secondary to current steroid taper. - Continue to monitor white count and fever curve  Atrial fibrillation > Status post Watchman procedure last September which was reportedly successful, currently on aspirin. - Continue home aspirin and bisoprolol -Prescribe diltiazem " as needed "  CHF > Last echo was TEE done around the time of his Watchman in September 2022 which showed EF 60-65% and normal RV EF. > You have borderline elevated BNP in the ED at 120.  Chest x-ray clear. - Continue home Lasix, bisoprolol - Monitor ins and outs and daily weights  GERD - Continue home PPI  Anxiety - Continue home sertraline  Hyperlipidemia - Continue home atorvastatin  DVT prophylaxis: Lovenox Code Status:   Full Family Communication:  None on admission Disposition Plan:   Patient is from:  Home  Anticipated DC to:  Home  Anticipated DC date:  1 to 3 days  Anticipated DC barriers: None  Consults called:  None Admission status:  Observation, telemetry  Severity of Illness: The appropriate patient status for this patient is OBSERVATION. Observation status is judged to be  reasonable and necessary in order to provide the required intensity of service to ensure the patient's safety. The patient's presenting symptoms, physical exam findings, and initial radiographic and laboratory data in the context of their medical condition is felt to place them at decreased risk for further clinical deterioration. Furthermore, it is anticipated that the patient will be medically stable for discharge from the hospital within 2 midnights of admission.     Marcelyn Bruins MD [facility]  How to contact the [facility] Attending or Consulting provider [place] or covering provider during after hours [place], for this patient?   Check the care team in [facility] and look for a) attending/consulting [facility] provider listed and b) the [facility] team listed Log into [email or web address] and use [place]'s universal password to access. If you do not have the password, please contact the hospital operator. Locate the [facility] provider you are looking for under [facility] and page to a number that you can be directly reached. If you still have difficulty reaching the provider, please page the [facility] (Director on Call) for the Hospitalists listed on amion for assistance.  02/04/2022, 6:28 PM

## 2022-02-04 NOTE — Progress Notes (Signed)
° ° °  This pt is a current pt with our [facility] program. This is a home-based Palliative care program that is provided by [facility]. Pt was admitted to our program on 03/11/2020.   We will follow the pt at home after discharge to assist with any chronic/acute symptom management needs. He is currently receiving  nursing and SW support in the home with these services.   He is a DNR at home per his wishes.   Please let us know if we can assist with d/c plan at home. Webb Silversmith RN [phone]

## 2022-02-04 NOTE — ED Provider Notes (Addendum)
[place] [facility] Provider Note   CSN: [id number] Arrival date & time: 02/04/22  6269     History  Chief Complaint  Patient presents with   Shortness of Breath    Paul [name] is a 76 y.o. male.  Presented to the emergency room with shortness of breath.  Patient has history of COPD, recently admitted for COPD exacerbation.  On 2 L oxygen at home at night only.  More recently has been using it during the day due to concern for increased shortness of breath.  Over the last few days he has been feeling more short of breath having increased cough.  His primary care doctor started him on Augmentin for possible aspiration pneumonia couple days ago.  No improvement since starting the antibiotics.  No chest pain.  No high fevers that he is aware of at home.  Additional history obtained from chart review, review last discharge summary, admitted in January for COPD exacerbation.  HPI     Home Medications Prior to Admission medications   Medication Sig Start Date End Date Taking? Authorizing Provider  acetaminophen (TYLENOL) 325 MG tablet Take 650 mg by mouth every 6 (six) hours as needed for mild pain or fever.    [provider]  albuterol (PROAIR HFA) 108 (90 Base) MCG/ACT inhaler Inhale 2 puffs into the lungs every 6 (six) hours as needed for wheezing or shortness of breath.     [provider]  albuterol (PROVENTIL) (2.5 MG/3ML) 0.083% nebulizer solution Take 3 mLs (2.5 mg total) by nebulization in the morning and at bedtime. 05/09/20   Parrett, Fonnie Mu, NP  amoxicillin-clavulanate (AUGMENTIN) 875-125 MG tablet Take 1 tablet by mouth 2 (two) times daily. 01/31/22   CoxElnita Maxwell, MD  Ascorbic Acid (VITAMIN C) 1000 MG tablet Take 1,000 mg by mouth daily.    [provider]  aspirin EC 81 MG tablet Take 1 tablet (81 mg total) by mouth daily. Swallow whole. 01/28/22   Tommie Raymond, NP  atorvastatin (LIPITOR) 40 MG tablet TAKE 1 TABLET BY  MOUTH ONCE DAILY AFTER SUPPER 11/29/21   Cox, Elnita Maxwell, MD  bisoprolol (ZEBETA) 5 MG tablet Take 1 tablet (5 mg total) by mouth daily. 01/28/22   Kathyrn Drown D, NP  Budeson-Glycopyrrol-Formoterol (BREZTRI AEROSPHERE) 160-9-4.8 MCG/ACT AERO Inhale 2 puffs into the lungs 2 (two) times daily. 03/26/21   Cox, Elnita Maxwell, MD  calcium carbonate (OS-CAL) 600 MG TABS tablet Take 600 mg by mouth daily.    [provider]  Cholecalciferol (VITAMIN D) 50 MCG (2000 UT) CAPS Take 2,000 Units by mouth daily.    [provider]  denosumab (PROLIA) 60 MG/ML SOSY injection Inject 60 mg into the skin every 6 (six) months.    [provider]  diltiazem (CARDIZEM) 60 MG tablet Take 1 tablet (60 mg total) by mouth 4 (four) times daily as needed. 01/19/21   Jerline Pain, MD  ferrous sulfate 325 (65 FE) MG tablet Take 325 mg by mouth 2 (two) times daily with a meal.    [provider]  furosemide (LASIX) 40 MG tablet Take 1 tablet (40 mg total) by mouth daily. 01/19/21   Jerline Pain, MD  guaiFENesin (MUCINEX) 600 MG 12 hr tablet Take 600 mg by mouth 2 (two) times daily.    [provider]  loratadine (CLARITIN) 10 MG tablet Take 10 mg by mouth in the morning.    [provider]  Multiple Vitamin (MULTIVITAMIN WITH  MINERALS) TABS tablet Take 1 tablet by mouth daily.    [provider]  Niacinamide-Zn-Cu-Methfo-Se-Cr (NICOTINAMIDE PO) Take 1 tablet by mouth in the morning and at bedtime.    [provider]  OXYGEN Inhale 2-2.5 L/min into the lungs See admin instructions. Inhale 2 L/min of oxygen into the lungs at bedtime and when ambulatory    [provider]  pantoprazole (PROTONIX) 40 MG tablet Take 1 tablet by mouth once daily 12/12/21   Jerline Pain, MD  predniSONE (DELTASONE) 10 MG tablet 4 tabs for 3 days, then 3 tabs for 3 days, 2 tabs for 3 days, then 1 tab for 3 days, then hold at 5mg  daily 01/10/22   Parrett, Fonnie Mu, NP  sertraline  (ZOLOFT) 50 MG tablet Take 1 tablet (50 mg total) by mouth daily. 12/31/21   Cox, Elnita Maxwell, MD  tiZANidine (ZANAFLEX) 2 MG tablet Take 1 tablet (2 mg total) by mouth every 6 (six) hours as needed for muscle spasms. 11/12/21   [name], MD      Allergies    Daliresp [roflumilast], Levaquin [levofloxacin], and Alendronate    Review of Systems   Review of Systems  Constitutional:  Positive for chills and fatigue. Negative for fever.  HENT:  Negative for ear pain and sore throat.   Eyes:  Negative for pain and visual disturbance.  Respiratory:  Positive for cough and shortness of breath.   Cardiovascular:  Negative for chest pain and palpitations.  Gastrointestinal:  Negative for abdominal pain and vomiting.  Genitourinary:  Negative for dysuria and hematuria.  Musculoskeletal:  Negative for arthralgias and back pain.  Skin:  Negative for color change and rash.  Neurological:  Negative for seizures and syncope.  All other systems reviewed and are negative.  Physical Exam Updated Vital Signs BP (!) 142/68    Pulse (!) 106    Temp 98.4 F (36.9 C) (Oral)    Resp (!) 27    Ht 5\' 4"  (1.626 m)    Wt 66.2 kg    SpO2 97%    BMI 25.05 kg/m  Physical Exam Vitals and nursing note reviewed.  Constitutional:      General: He is not in acute distress.    Appearance: He is well-developed.  HENT:     Head: Normocephalic and atraumatic.  Eyes:     Conjunctiva/sclera: Conjunctivae normal.  Cardiovascular:     Rate and Rhythm: Normal rate and regular rhythm.     Heart sounds: No murmur heard. Pulmonary:     Comments: Bilateral expiratory wheezing, prolonged expiratory phase, no crackles noted Abdominal:     Palpations: Abdomen is soft.     Tenderness: There is no abdominal tenderness.  Musculoskeletal:        General: No swelling.     Cervical back: Neck supple.  Skin:    General: Skin is warm and dry.     Capillary Refill: Capillary refill takes less than 2 seconds.  Neurological:      Mental Status: He is alert.  Psychiatric:        Mood and Affect: Mood normal.    ED Results / Procedures / Treatments   Labs (all labs ordered are listed, but only abnormal results are displayed) Labs Reviewed  CBC WITH DIFFERENTIAL/PLATELET - Abnormal; Notable for the following components:      Result Value   WBC 16.4 (*)    Hemoglobin 12.0 (*)    HCT 38.2 (*)    MCH 25.5 (*)  RDW 16.8 (*)    Neutro Abs 14.5 (*)    Abs Immature Granulocytes 0.08 (*)    All other components within normal limits  BASIC METABOLIC PANEL - Abnormal; Notable for the following components:   Chloride 94 (*)    Glucose, Bld 104 (*)    All other components within normal limits  BRAIN NATRIURETIC PEPTIDE - Abnormal; Notable for the following components:   B Natriuretic Peptide 120.4 (*)    All other components within normal limits  RESP PANEL BY RT-PCR (FLU A&B, COVID) ARPGX2  TROPONIN I (HIGH SENSITIVITY)  TROPONIN I (HIGH SENSITIVITY)    EKG EKG Interpretation  Date/Time:  Monday February 04 2022 09:58:46 EST Ventricular Rate:  99 PR Interval:  117 QRS Duration: 83 QT Interval:  320 QTC Calculation: 411 R Axis:   72 Text Interpretation: Sinus rhythm Probable anteroseptal infarct, old Minimal ST depression, inferior leads Confirmed by Madalyn Rob [phone]) on 02/04/2022 10:50:17 AM  Radiology DG Chest Portable 1 View  Result Date: 02/04/2022 CLINICAL DATA:  Cough and fever. EXAM: PORTABLE CHEST 1 VIEW COMPARISON:  12/28/2021 FINDINGS: 1211 hours. The lungs are clear without focal pneumonia, edema, pneumothorax or pleural effusion. Probable tiny granuloma right upper lobe stable since 01/30/2021. The cardiopericardial silhouette is within normal limits for size. The visualized bony structures of the thorax show no acute abnormality. Telemetry leads overlie the chest. IMPRESSION: No active disease. Electronically Signed   By: Misty Stanley M.D.   On: 02/04/2022 12:21    Procedures .Critical  Care Performed by: Lucrezia Starch, MD Authorized by: Lucrezia Starch, MD   Critical care provider statement:    Critical care time (minutes):  35   Critical care was time spent personally by me on the following activities:  Development of treatment plan with patient or surrogate, discussions with consultants, evaluation of patient's response to treatment, examination of patient, ordering and review of laboratory studies, ordering and review of radiographic studies, ordering and performing treatments and interventions, pulse oximetry, re-evaluation of patient's condition and review of old charts    Medications Ordered in ED Medications  albuterol (PROVENTIL,VENTOLIN) solution continuous neb (0 mg/hr Nebulization Stopped 02/04/22 1205)  ipratropium-albuterol (DUONEB) 0.5-2.5 (3) MG/3ML nebulizer solution 3 mL (3 mLs Nebulization Given 02/04/22 1349)  0.9 %  sodium chloride infusion ( Intravenous New Bag/Given 02/04/22 1432)  ipratropium (ATROVENT) nebulizer solution 0.5 mg (0.5 mg Nebulization Given 02/04/22 1111)  methylPREDNISolone sodium succinate (SOLU-MEDROL) 125 mg/2 mL injection 125 mg (125 mg Intravenous Given 02/04/22 1112)  magnesium sulfate IVPB 2 g 50 mL (2 g Intravenous New Bag/Given 02/04/22 1433)  potassium chloride SA (KLOR-CON M) CR tablet 20 mEq (20 mEq Oral Given 02/04/22 1437)    ED Course/ Medical Decision Making/ A&P                           Medical Decision Making Amount and/or Complexity of Data Reviewed Labs: ordered. Radiology: ordered.  Risk Prescription drug management. Decision regarding hospitalization.   76 year old male presenting to ER with concern for shortness of breath, wheezing.  On exam patient noted to have significant wheezing, prolonged expiratory phase.  Concern for COPD exacerbation.  Noted leukocytosis.  On steroids.  No significant electrolyte derangement.  CXR without evidence for pneumonia.  Independently reviewed CXR and agree with  radiology findings.  Patient was provided with Solu-Medrol, continuous albuterol treatment and ipratropium.  Patient had some improvement in symptoms but continued to  have significant wheezing.  Patient was borderline hypoxic on room air and is currently on 1 L nasal cannula.  Normally only wears oxygen at night.  Given his significant wheezing, need for repeat neb treatments, feel he would benefit from admission for further management.  I discussed the case with Dr. Olevia Bowens with hospitalist service who agrees to admit patient for further management.  Additional history obtained from chart review, review recent discharge summary for COPD exacerbation in January.  Additional history also obtained from wife at bedside.  She was updated throughout patient's stay.        Final Clinical Impression(s) / ED Diagnoses Final diagnoses:  COPD exacerbation (HCC)  Leukocytosis, unspecified type  Acute on chronic respiratory failure with hypoxia [facility])    Rx / DC Orders ED Discharge Orders     None         Lucrezia Starch, MD 02/04/22 1549    Lucrezia Starch, MD 02/04/22 1550

## 2022-02-04 NOTE — ED Triage Notes (Signed)
Has hx of COPD, not feeling well for the past 2 days, is taking Augmentin. Having increase in cough and worsening shortness of breath. On home oxygen 2lpm via [place]

## 2022-02-04 NOTE — Progress Notes (Signed)
Plan of Care Note for accepted transfer   Patient: Paul [name] MRN: [id number]   [place]: 02/04/2022  Facility requesting transfer: [facility].  Requesting Provider: Madalyn Rob, MD. Reason for transfer: COPD exacerbation.  Facility course: 76 year old male with a past medical history of hyperlipidemia, type II DM, hypertension, chronic diastolic CHF, paroxysmal atrial fibrillation, GERD, pulmonary nodules, history of aspiration pneumonia, COPD Gold III who is coming to the emergency department with complaints of progressively worsening dyspnea for the past 2 days.  Plan of care: The patient is accepted for admission to Telemetry unit, at [facility].  Author: Reubin Milan, MD 02/04/2022  Check [email or web address] for on-call coverage.  Nursing staff, Please call [place] number on Amion as soon as patient's arrival, so appropriate admitting provider can evaluate the pt.

## 2022-02-05 DIAGNOSIS — J9621 Acute and chronic respiratory failure with hypoxia: Secondary | ICD-10-CM

## 2022-02-05 DIAGNOSIS — K219 Gastro-esophageal reflux disease without esophagitis: Secondary | ICD-10-CM | POA: Diagnosis not present

## 2022-02-05 DIAGNOSIS — D72829 Elevated white blood cell count, unspecified: Secondary | ICD-10-CM | POA: Diagnosis not present

## 2022-02-05 DIAGNOSIS — Z9981 Dependence on supplemental oxygen: Secondary | ICD-10-CM | POA: Diagnosis not present

## 2022-02-05 DIAGNOSIS — J441 Chronic obstructive pulmonary disease with (acute) exacerbation: Secondary | ICD-10-CM | POA: Diagnosis not present

## 2022-02-05 DIAGNOSIS — I10 Essential (primary) hypertension: Secondary | ICD-10-CM | POA: Diagnosis not present

## 2022-02-05 DIAGNOSIS — I480 Paroxysmal atrial fibrillation: Secondary | ICD-10-CM | POA: Diagnosis not present

## 2022-02-05 DIAGNOSIS — I5032 Chronic diastolic (congestive) heart failure: Secondary | ICD-10-CM | POA: Diagnosis not present

## 2022-02-05 DIAGNOSIS — E785 Hyperlipidemia, unspecified: Secondary | ICD-10-CM | POA: Diagnosis not present

## 2022-02-05 DIAGNOSIS — E1169 Type 2 diabetes mellitus with other specified complication: Secondary | ICD-10-CM | POA: Diagnosis not present

## 2022-02-05 DIAGNOSIS — E782 Mixed hyperlipidemia: Secondary | ICD-10-CM | POA: Diagnosis not present

## 2022-02-05 LAB — RESPIRATORY PANEL BY PCR

## 2022-02-05 LAB — COMPREHENSIVE METABOLIC PANEL
ALT: 26 U/L (ref 0–44)
AST: 18 U/L (ref 15–41)
Albumin: 3.1 g/dL — ABNORMAL LOW (ref 3.5–5.0)
Alkaline Phosphatase: 49 U/L (ref 38–126)
Anion gap: 7 (ref 5–15)
BUN: 20 mg/dL (ref 8–23)
CO2: 31 mmol/L (ref 22–32)
Calcium: 9.1 mg/dL (ref 8.9–10.3)
Chloride: 97 mmol/L — ABNORMAL LOW (ref 98–111)
Creatinine, Ser: 0.85 mg/dL (ref 0.61–1.24)
GFR, Estimated: >60 mL/min (ref >60)
Glucose, Bld: 128 mg/dL — ABNORMAL HIGH (ref 70–99)
Potassium: 4.3 mmol/L (ref 3.5–5.1)
Sodium: 135 mmol/L (ref 135–145)
Total Bilirubin: 0.2 mg/dL — ABNORMAL LOW (ref 0.3–1.2)
Total Protein: 6.7 g/dL (ref 6.5–8.1)

## 2022-02-05 LAB — GLUCOSE, CAPILLARY
Glucose-Capillary: 114 mg/dL — ABNORMAL HIGH (ref 70–99)
Glucose-Capillary: 164 mg/dL — ABNORMAL HIGH (ref 70–99)
Glucose-Capillary: 174 mg/dL — ABNORMAL HIGH (ref 70–99)
Glucose-Capillary: 241 mg/dL — ABNORMAL HIGH (ref 70–99)

## 2022-02-05 LAB — CBC
HCT: 34.6 % — ABNORMAL LOW (ref 39.0–52.0)
Hemoglobin: 10.9 g/dL — ABNORMAL LOW (ref 13.0–17.0)
MCH: 25.8 pg — ABNORMAL LOW (ref 26.0–34.0)
MCHC: 31.5 g/dL (ref 30.0–36.0)
MCV: 81.8 fL (ref 80.0–100.0)
Platelets: 302 10*3/uL (ref 150–400)
RBC: 4.23 MIL/uL (ref 4.22–5.81)
RDW: 16.8 % — ABNORMAL HIGH (ref 11.5–15.5)
WBC: 19.3 10*3/uL — ABNORMAL HIGH (ref 4.0–10.5)
nRBC: 0 % (ref 0.0–0.2)

## 2022-02-05 MED ORDER — ALBUTEROL SULFATE (2.5 MG/3ML) 0.083% IN NEBU
2.5000 mg | INHALATION_SOLUTION | Freq: Four times a day (QID) | RESPIRATORY_TRACT | Status: DC
  Administered 2022-02-06 – 2022-02-08 (×10): 2.5 mg via RESPIRATORY_TRACT
  Filled 2022-02-05 (×9): qty 3

## 2022-02-05 MED ORDER — BUDESONIDE 0.25 MG/2ML IN SUSP
0.2500 mg | Freq: Two times a day (BID) | RESPIRATORY_TRACT | Status: DC
  Administered 2022-02-05 – 2022-02-08 (×6): 0.25 mg via RESPIRATORY_TRACT
  Filled 2022-02-05 (×6): qty 2

## 2022-02-05 MED ORDER — GUAIFENESIN-DM 100-10 MG/5ML PO SYRP
5.0000 mL | ORAL_SOLUTION | ORAL | Status: DC | PRN
  Administered 2022-02-05 – 2022-02-08 (×6): 5 mL via ORAL
  Filled 2022-02-05 (×6): qty 10

## 2022-02-05 MED ORDER — MELATONIN 3 MG PO TABS
3.0000 mg | ORAL_TABLET | Freq: Every day | ORAL | Status: DC
  Administered 2022-02-05 – 2022-02-07 (×4): 3 mg via ORAL
  Filled 2022-02-05 (×4): qty 1

## 2022-02-05 NOTE — Assessment & Plan Note (Signed)
Continue diabetic diet, will add sliding scale insulin and Accu-Cheks.

## 2022-02-05 NOTE — Progress Notes (Signed)
°   02/05/22 1200  Oxygen Therapy  SpO2 96 %  O2 Device Nasal Cannula  O2 Flow Rate (L/min) 4 L/min  Patient Activity (if Appropriate) Ambulating  Pulse Oximetry Type Continuous  Mobility  Activity Ambulated with assistance in hallway  Level of Assistance Standby assist, set-up cues, supervision of patient - no hands on  Assistive Device None  Distance Ambulated (ft) 400 ft  Activity Response Tolerated well  $Mobility charge 1 Mobility   Pt agreeable to mobilize this morning. Pt stated he gets winded easily while attempting to mobilize. Ambulated about 439ft in hall with 4L O2, tolerated well. No complaints. Left pt in bed, call bell at side. RN/NT notified of session.   [place] Specialist Acute Rehab Services Office: [phone]

## 2022-02-05 NOTE — Hospital Course (Signed)
Paul [name] is a 76 y.o. male with medical history significant of COPD Gold 3 oxygen and steroid-dependent at baseline,, A-fib status post Watchman device in the past, hyperlipidemia, GERD, anxiety, hypertension, diabetes, CHF, with recurrent exacerbations and hospitalizations in the past presented to hospital with progressive cough, shortness of breath and wheezing for last 4 to 5 days.  Patient did have a prolonged hospitalization in the last visit when he was noted to have viral infection and pulmonary had seen the patient at that time.  Patient also complains of increased sputum production and green phlegm.  In the ED, patient was tachypneic on 2 L of oxygen with diffuse wheezing.  CBC showed mild leukocytosis.  Chest x-ray without acute findings.  Patient received Solu-Medrol, magnesium, potassium, albuterol DuoNeb and was admitted to hospital for further evaluation and treatment.

## 2022-02-05 NOTE — Assessment & Plan Note (Signed)
Continue Cardizem. 

## 2022-02-05 NOTE — Assessment & Plan Note (Signed)
-  Continue Lipitor °

## 2022-02-05 NOTE — Assessment & Plan Note (Signed)
Continue Protonix °

## 2022-02-05 NOTE — Progress Notes (Signed)
PROGRESS NOTE    Paul [name]  XNT:[id number] DOB: [birth year] DOA: 02/04/2022 PCP: [name], MD    Brief Narrative:  Paul [name] is a 76 y.o. male with medical history significant of COPD Gold 3 oxygen and steroid-dependent at baseline,, A-fib status post Watchman device in the past, hyperlipidemia, GERD, anxiety, hypertension, diabetes, CHF, with recurrent exacerbations and hospitalizations in the past presented to hospital with progressive cough, shortness of breath and wheezing for last 4 to 5 days.  Patient did have a prolonged hospitalization in the last visit when he was noted to have viral infection and pulmonary had seen the patient at that time.  Patient also complains of increased sputum production and green phlegm.  In the ED, patient was tachypneic on 2 L of oxygen with diffuse wheezing.  CBC showed mild leukocytosis.  Chest x-ray without acute findings.  Patient received Solu-Medrol, magnesium, potassium, albuterol DuoNeb and was admitted to hospital for further evaluation and treatment.     Assessment and Plan: * COPD with acute exacerbation ([place])- (present on admission) Patient with a recent prolonged stay in the hospital last admission with COPD exacerbation from RSV infection.  Very high risk of decompensation and prolonged illness.  Chronically on 2 L oxygen at baseline.  Continue nebulizers, oxygen, IV steroids, symptomatic care.  Seen by pulmonary in the last admission.  Continue Zithromax empirically.  Patient follows up with Dr. Melvyn Novas as outpatient.  Pending respiratory viral panel  Chronic diastolic CHF (congestive heart failure) ([place])- (present on admission) Continue Lasix orally.  Intake and output charting, daily weights.  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[place])- (present on admission) Continue diabetic diet, will add sliding scale insulin and Accu-Cheks.  GERD (gastroesophageal reflux disease) / GI protection on antiplatelet therapy- (present on  admission) Continue Protonix  Oxygen dependent At baseline.  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  Continue bisoprolol, Cardizem.  Not on anticoagulation  Essential hypertension- (present on admission) Continue Cardizem.  HLD (hyperlipidemia)- (present on admission) Continue Lipitor    DVT prophylaxis: enoxaparin (LOVENOX) injection 40 mg Start: 02/04/22 2200   Code Status:     Code Status: Full Code  Disposition: Home  Status is: Observation The patient will require care spanning > 2 midnights and should be moved to inpatient because: COPD exacerbation, persistent wheezing, need for nebulizers steroids   Family Communication: Spoke with the patient at bedside  Consultants:  None  Procedures:  None  Antimicrobials:  Azithromycin  Anti-infectives (From admission, onward)    Start     Dose/Rate Route Frequency Ordered Stop   02/05/22 1000  azithromycin (ZITHROMAX) tablet 250 mg       See Hyperspace for full Linked Orders Report.   250 mg Oral Daily 02/04/22 2105 02/09/22 0959   02/04/22 2200  azithromycin (ZITHROMAX) tablet 500 mg       See Hyperspace for full Linked Orders Report.   500 mg Oral Daily 02/04/22 2105 02/04/22 2109       Subjective: Today, patient was seen and examined at bedside.  Patient complains of diffuse wheezing cough shortness of breath and dyspnea on minimal exertion.  Does not feel much improved  Objective: Vitals:   02/05/22 0925 02/05/22 1200 02/05/22 1204 02/05/22 1318  BP: (!) 118/58  128/63   Pulse: 95  95   Resp: 19  18   Temp: 98 F (36.7 C)  98.5 F (36.9 C)   TempSrc: Oral  Oral   SpO2: 98% 96% 98% 96%  Weight:      Height:        Intake/Output Summary (Last 24 hours) at 02/05/2022 1401 Last data filed at 02/05/2022 1019 Gross per 24 hour  Intake 558.47 ml  Output --  Net 558.47 ml   Filed Weights   02/04/22  0947 02/05/22 0500  Weight: 66.2 kg 65.8 kg    Physical Examination:  General:  Average built, not in obvious distress, on nasal cannula oxygen HENT:   No scleral pallor or icterus noted. Oral mucosa is moist.  Chest:    Diminished breath sounds bilaterally.  Bilateral diffuse wheezing noted. CVS: S1 &S2 heard. No murmur.  Regular rate and rhythm. Abdomen: Soft, nontender, nondistended.  Bowel sounds are heard.   Extremities: No cyanosis, clubbing or edema.  Peripheral pulses are palpable. Psych: Alert, awake and oriented, normal mood CNS:  No cranial nerve deficits.  Power equal in all extremities.   Skin: Warm and dry.  No rashes noted.  Data Reviewed:   CBC: Recent Labs  Lab 02/04/22 1031 02/05/22 0505  WBC 16.4* 19.3*  NEUTROABS 14.5*  --   HGB 12.0* 10.9*  HCT 38.2* 34.6*  MCV 81.1 81.8  PLT 327 511    Basic Metabolic Panel: Recent Labs  Lab 02/04/22 1031 02/05/22 0505  NA 135 135  K 3.8 4.3  CL 94* 97*  CO2 29 31  GLUCOSE 104* 128*  BUN 15 20  CREATININE 0.86 0.85  CALCIUM 9.1 9.1    Liver Function Tests: Recent Labs  Lab 02/05/22 0505  AST 18  ALT 26  ALKPHOS 49  BILITOT 0.2*  PROT 6.7  ALBUMIN 3.1*     Radiology Studies: DG Chest Portable 1 View  Result Date: 02/04/2022 CLINICAL DATA:  Cough and fever. EXAM: PORTABLE CHEST 1 VIEW COMPARISON:  12/28/2021 FINDINGS: 1211 hours. The lungs are clear without focal pneumonia, edema, pneumothorax or pleural effusion. Probable tiny granuloma right upper lobe stable since 01/30/2021. The cardiopericardial silhouette is within normal limits for size. The visualized bony structures of the thorax show no acute abnormality. Telemetry leads overlie the chest. IMPRESSION: No active disease. Electronically Signed   By: Misty Stanley M.D.   On: 02/04/2022 12:21      LOS: 0 days    Flora Lipps, MD [facility] 02/05/2022, 2:01 PM

## 2022-02-05 NOTE — Assessment & Plan Note (Signed)
At baseline.  Patient is a steroid and oxygen dependent

## 2022-02-05 NOTE — Assessment & Plan Note (Signed)
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  Continue bisoprolol, Cardizem.  Not on anticoagulation

## 2022-02-05 NOTE — Assessment & Plan Note (Addendum)
Patient with a recent prolonged stay in the hospital last admission with COPD exacerbation from RSV infection.  Very high risk of decompensation and prolonged illness.  Chronically on 2 L oxygen at baseline.  Continue nebulizers, oxygen, IV steroids, symptomatic care.  Seen by pulmonary in the last admission.  Continue Zithromax empirically.  Patient follows up with Dr. Melvyn Novas as outpatient.  Pending respiratory viral panel

## 2022-02-05 NOTE — Assessment & Plan Note (Signed)
Continue Lasix orally.  Intake and output charting, daily weights.

## 2022-02-06 ENCOUNTER — Inpatient Hospital Stay (HOSPITAL_COMMUNITY): Payer: Medicare Other

## 2022-02-06 DIAGNOSIS — E782 Mixed hyperlipidemia: Secondary | ICD-10-CM | POA: Diagnosis present

## 2022-02-06 DIAGNOSIS — I5032 Chronic diastolic (congestive) heart failure: Secondary | ICD-10-CM | POA: Diagnosis present

## 2022-02-06 DIAGNOSIS — F411 Generalized anxiety disorder: Secondary | ICD-10-CM | POA: Diagnosis present

## 2022-02-06 DIAGNOSIS — Z888 Allergy status to other drugs, medicaments and biological substances status: Secondary | ICD-10-CM | POA: Diagnosis not present

## 2022-02-06 DIAGNOSIS — B974 Respiratory syncytial virus as the cause of diseases classified elsewhere: Secondary | ICD-10-CM | POA: Diagnosis present

## 2022-02-06 DIAGNOSIS — Z87891 Personal history of nicotine dependence: Secondary | ICD-10-CM | POA: Diagnosis not present

## 2022-02-06 DIAGNOSIS — Z95818 Presence of other cardiac implants and grafts: Secondary | ICD-10-CM | POA: Diagnosis not present

## 2022-02-06 DIAGNOSIS — J9621 Acute and chronic respiratory failure with hypoxia: Secondary | ICD-10-CM | POA: Diagnosis present

## 2022-02-06 DIAGNOSIS — J189 Pneumonia, unspecified organism: Secondary | ICD-10-CM | POA: Diagnosis not present

## 2022-02-06 DIAGNOSIS — Z9981 Dependence on supplemental oxygen: Secondary | ICD-10-CM | POA: Diagnosis not present

## 2022-02-06 DIAGNOSIS — B9789 Other viral agents as the cause of diseases classified elsewhere: Secondary | ICD-10-CM | POA: Diagnosis present

## 2022-02-06 DIAGNOSIS — T380X5A Adverse effect of glucocorticoids and synthetic analogues, initial encounter: Secondary | ICD-10-CM | POA: Diagnosis not present

## 2022-02-06 DIAGNOSIS — R0602 Shortness of breath: Secondary | ICD-10-CM | POA: Diagnosis present

## 2022-02-06 DIAGNOSIS — Z8701 Personal history of pneumonia (recurrent): Secondary | ICD-10-CM | POA: Diagnosis not present

## 2022-02-06 DIAGNOSIS — Z20822 Contact with and (suspected) exposure to covid-19: Secondary | ICD-10-CM | POA: Diagnosis present

## 2022-02-06 DIAGNOSIS — I480 Paroxysmal atrial fibrillation: Secondary | ICD-10-CM | POA: Diagnosis present

## 2022-02-06 DIAGNOSIS — M810 Age-related osteoporosis without current pathological fracture: Secondary | ICD-10-CM | POA: Diagnosis present

## 2022-02-06 DIAGNOSIS — Z8616 Personal history of COVID-19: Secondary | ICD-10-CM | POA: Diagnosis not present

## 2022-02-06 DIAGNOSIS — E1169 Type 2 diabetes mellitus with other specified complication: Secondary | ICD-10-CM | POA: Diagnosis present

## 2022-02-06 DIAGNOSIS — J449 Chronic obstructive pulmonary disease, unspecified: Secondary | ICD-10-CM | POA: Diagnosis not present

## 2022-02-06 DIAGNOSIS — E1165 Type 2 diabetes mellitus with hyperglycemia: Secondary | ICD-10-CM | POA: Diagnosis not present

## 2022-02-06 DIAGNOSIS — J690 Pneumonitis due to inhalation of food and vomit: Secondary | ICD-10-CM | POA: Diagnosis not present

## 2022-02-06 DIAGNOSIS — K219 Gastro-esophageal reflux disease without esophagitis: Secondary | ICD-10-CM | POA: Diagnosis present

## 2022-02-06 DIAGNOSIS — J441 Chronic obstructive pulmonary disease with (acute) exacerbation: Secondary | ICD-10-CM | POA: Diagnosis present

## 2022-02-06 DIAGNOSIS — Z8249 Family history of ischemic heart disease and other diseases of the circulatory system: Secondary | ICD-10-CM | POA: Diagnosis not present

## 2022-02-06 DIAGNOSIS — I110 Hypertensive heart disease with heart failure: Secondary | ICD-10-CM | POA: Diagnosis present

## 2022-02-06 DIAGNOSIS — Z881 Allergy status to other antibiotic agents status: Secondary | ICD-10-CM | POA: Diagnosis not present

## 2022-02-06 DIAGNOSIS — D72829 Elevated white blood cell count, unspecified: Secondary | ICD-10-CM | POA: Diagnosis present

## 2022-02-06 LAB — CBC
HCT: 35.6 % — ABNORMAL LOW (ref 39.0–52.0)
Hemoglobin: 11 g/dL — ABNORMAL LOW (ref 13.0–17.0)
MCH: 25.6 pg — ABNORMAL LOW (ref 26.0–34.0)
MCHC: 30.9 g/dL (ref 30.0–36.0)
MCV: 83 fL (ref 80.0–100.0)
Platelets: 326 10*3/uL (ref 150–400)
RBC: 4.29 MIL/uL (ref 4.22–5.81)
RDW: 16.9 % — ABNORMAL HIGH (ref 11.5–15.5)
WBC: 20.4 10*3/uL — ABNORMAL HIGH (ref 4.0–10.5)
nRBC: 0 % (ref 0.0–0.2)

## 2022-02-06 LAB — BASIC METABOLIC PANEL
Anion gap: 9 (ref 5–15)
BUN: 19 mg/dL (ref 8–23)
CO2: 30 mmol/L (ref 22–32)
Calcium: 8.7 mg/dL — ABNORMAL LOW (ref 8.9–10.3)
Chloride: 100 mmol/L (ref 98–111)
Creatinine, Ser: 0.75 mg/dL (ref 0.61–1.24)
GFR, Estimated: >60 mL/min (ref >60)
Glucose, Bld: 116 mg/dL — ABNORMAL HIGH (ref 70–99)
Potassium: 4.3 mmol/L (ref 3.5–5.1)
Sodium: 139 mmol/L (ref 135–145)

## 2022-02-06 LAB — GLUCOSE, CAPILLARY
Glucose-Capillary: 95 mg/dL (ref 70–99)
Glucose-Capillary: 111 mg/dL — ABNORMAL HIGH (ref 70–99)
Glucose-Capillary: 166 mg/dL — ABNORMAL HIGH (ref 70–99)
Glucose-Capillary: 206 mg/dL — ABNORMAL HIGH (ref 70–99)

## 2022-02-06 LAB — EXPECTORATED SPUTUM ASSESSMENT W GRAM STAIN, RFLX TO RESP C

## 2022-02-06 MED ORDER — METHYLPREDNISOLONE SODIUM SUCC 125 MG IJ SOLR: 60.0000 mg | Freq: Two times a day (BID) | INTRAMUSCULAR | Status: DC

## 2022-02-06 MED ORDER — ALPRAZOLAM 0.25 MG PO TABS
0.2500 mg | ORAL_TABLET | Freq: Every day | ORAL | Status: DC | PRN
  Administered 2022-02-06 – 2022-02-07 (×2): 0.25 mg via ORAL
  Filled 2022-02-06 (×2): qty 1

## 2022-02-06 MED ORDER — ONDANSETRON HCL 4 MG/2ML IJ SOLN
4.0000 mg | Freq: Four times a day (QID) | INTRAMUSCULAR | Status: DC | PRN
  Administered 2022-02-06: 4 mg via INTRAVENOUS
  Filled 2022-02-06: qty 2

## 2022-02-06 MED ORDER — AZITHROMYCIN 250 MG PO TABS
250.0000 mg | ORAL_TABLET | Freq: Once | ORAL | Status: AC
  Administered 2022-02-06: 250 mg via ORAL
  Filled 2022-02-06: qty 1

## 2022-02-06 MED ORDER — DILTIAZEM HCL 60 MG PO TABS: 60.0000 mg | ORAL_TABLET | Freq: Four times a day (QID) | ORAL | Status: DC | PRN

## 2022-02-06 MED ORDER — SODIUM CHLORIDE 0.9 % IV SOLN
1.0000 g | INTRAVENOUS | Status: DC
  Administered 2022-02-06 – 2022-02-07 (×2): 1 g via INTRAVENOUS
  Filled 2022-02-06 (×3): qty 10

## 2022-02-06 MED ORDER — METHYLPREDNISOLONE SODIUM SUCC 125 MG IJ SOLR
60.0000 mg | Freq: Two times a day (BID) | INTRAMUSCULAR | Status: DC
  Administered 2022-02-06 – 2022-02-08 (×4): 60 mg via INTRAVENOUS
  Filled 2022-02-06 (×4): qty 2

## 2022-02-06 MED ORDER — AZITHROMYCIN 250 MG PO TABS
500.0000 mg | ORAL_TABLET | Freq: Every day | ORAL | Status: AC
  Administered 2022-02-07 – 2022-02-08 (×2): 500 mg via ORAL
  Filled 2022-02-06 (×2): qty 2

## 2022-02-06 NOTE — Progress Notes (Signed)
°   02/06/22 1001  Vitals  Temp (!) 100.7 F (38.2 C)  Temp Source Oral  BP 114/70  MAP (mmHg) 85  BP Location Right Arm  BP Method Automatic  Patient Position (if appropriate) Lying  Pulse Rate (!) 118  Pulse Rate Source Monitor  Level of Consciousness  Level of Consciousness Alert  MEWS COLOR  MEWS Score Color Yellow  Oxygen Therapy  SpO2 97 %  O2 Device Nasal Cannula  O2 Flow Rate (L/min) 2 L/min  Pain Assessment  Pain Scale 0-10  Complaints & Interventions  Complains of Coughing  Interventions Medication (see MAR)  Patients response to intervention Relief  MEWS Score  MEWS Temp 1  MEWS Systolic 0  MEWS Pulse 2  MEWS RR 0  MEWS LOC 0  MEWS Score 3  Provider Notification  Provider Name/Title Dahal MD  Date Provider Notified 02/06/22  Time Provider Notified 1010  Notification Type Page  Notification Reason Change in status  Provider response See new orders  Date of Provider Response 02/06/22  Time of Provider Response 1012   Pt states that he feels that his breathing has worsened since yesterday. He complains of SOB, cough and nausea. PRN given for cough. MD notified about change in status and request made for nausea meds. RRT notified and came to bedside to give PRN breathing treatment. Will continue to monitor.

## 2022-02-06 NOTE — Progress Notes (Signed)
°   02/06/22 1359  Mobility  Activity Refused mobility   Attempted to see pt twice. Pt stated he had a rough night, and would like to rest today.   [place] Specialist Acute Rehab Services Office: [phone]

## 2022-02-06 NOTE — Progress Notes (Signed)
Patient appears much better, on re assessment pt. States he is feeling better with his breathing and denies nausea. He stated he may have panicked when his O2 sats dropped to 50's early morning. Pt. Is educated to call and for help when out of bed. Bed alarm on, Will closely monitor patient.

## 2022-02-06 NOTE — Progress Notes (Signed)
Called to floor to check patient. He appeared tio be anxious and coughing. Spo2 97% on 2L, HR 112, RR 24. A prn albuterol treatment given. RT will continue to monitor.

## 2022-02-06 NOTE — Progress Notes (Signed)
PROGRESS NOTE  LEANDRO BERKOWITZ  DOB: 1946/07/14  PCP: [name], MD IOX:[id number]  DOA: 02/04/2022  LOS: 0 days  Hospital Day: 3  Brief narrative: Paul [name] is a 76 y.o. male with PMH significant for COPD on 3 L oxygen at home, chronically on prednisone, DM2, HTN, HLD, GERD, A-fib status post Watchman device in the past, anxiety, recurrent COPD exacerbations and hospitalizations in the past. Patient presented to the ED on 02/04/2022 with complaint of progressively worsening shortness of breath, cough, wheezing, productive sputum for 4 to 5 days. His previous hospitalization was from 1/5 to 01/04/2022 for COPD exacerbation, RSV infection.  He was seen by pulmonary at that time.  In the ED, patient was tachypneic, had diffuse wheezing. Chest x-ray did not show any acute findings CBC showed mild leukocytosis. Patient was started on IV steroids, bronchodilators and admitted to hospitalist service.  Subjective: Patient was seen and examined morning.  Pleasant elderly Caucasian male.  Lying down in bed.  On 2 L oxygen at rest.  Has diffuse bilateral scattered wheezing and coughs on deep breathing. He states that this morning when he tried to get up and walk to the bathroom, his oxygen saturation dropped to 50%.  Principal Problem:   COPD with acute exacerbation ([place]) Active Problems:   HLD (hyperlipidemia)   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HCC)   Essential hypertension   Chronic diastolic CHF (congestive heart failure) (HCC)   Paroxysmal atrial fibrillation (HCC)   Oxygen dependent   GERD (gastroesophageal reflux disease) / GI protection on antiplatelet therapy    Assessment and Plan: COPD with acute exacerbation ([place])- (present on admission) Acute on chronic respiratory failure with hypoxia Rhinovirus infection -Present with progressive worsening shortness of breath, cough, wheezing.  RVP positive for rhinovirus.  He was recently admitted for RSV infection. -On  1/13, he was discharged on tapering course of prednisone.  Patient states he chronically is on prednisone 15 mg daily and follows up with Dr. Melvyn Novas as an outpatient. -Chronically on 2 L oxygen.  Currently on the same at rest but on minimal ambulation, he gets significantly short of breath and requires high flow oxygen. -Currently on prednisone 40 mg daily.  With his worsening clinical status, I decided to switch prednisone to IV Solu-Medrol 60 mg twice daily. -He has a low-grade fever of 100.7 this morning.  I would upgrade antibiotic coverage from Zithromax to combination of Rocephin and Zithromax. -Continue LABA/ICS, albuterol.  Add Robitussin  Chronic diastolic CHF Essential hypertension -Last echo from 08/2021 with EF 60 to 65%.   -Continue Cardizem, bisoprolol, Lasix.  Paroxysmal A-fib History of Maze procedure  -Continue bisoprolol 5 mg daily, Cardizem 60 mg every 6 hours as needed. Not on anticoagulation  Type 2 diabetes mellitus with hyperglycemia -A1c 6.5 on 12/27/2021.  Diabetes controlled but currently hyperglycemic because of steroids -Not on meds at home -Currently on sliding scale insulin with Accu-Cheks Recent Labs  Lab 02/05/22 0743 02/05/22 1202 02/05/22 1652 02/05/22 2018 02/06/22 0734  GLUCAP 114* 164* 174* 241* 95   Leukocytosis -WBC count running high because of steroids.  Continue to monitor.  Hyperlipidemia -Continue aspirin and Lipitor  GERD -Continue Protonix  Anxiety -Zoloft 50 mg daily, Xanax 0.25 mg daily as needed.   Mobility: Encourage ambulation. Goals of care   Code Status: Full Code    Nutritional status:  Body mass index is 24.9 kg/m.      Diet:  Diet Order  Diet heart healthy/carb modified Room service appropriate? Yes; Fluid consistency: Thin  Diet effective now                   DVT prophylaxis:  enoxaparin (LOVENOX) injection 40 mg Start: 02/04/22 2200   Antimicrobials: Zithromax Fluid: None Consultants:  None Family Communication: Called and updated patient's wife on the phone  Status is: Observation  Continue in-hospital care because: Needs IV steroids, further monitoring Level of care: Telemetry   Dispo: The patient is from: Home              Anticipated d/c is to: Hopefully home in next 2 to 3 days              Patient currently is not medically stable to d/c.   Difficult to place patient No     Infusions:   sodium chloride 10 mL/hr at 02/04/22 1432    Scheduled Meds:  albuterol  2.5 mg Nebulization Q6H   aspirin EC  81 mg Oral Daily   atorvastatin  40 mg Oral Daily   azithromycin  250 mg Oral Daily   bisoprolol  5 mg Oral Daily   budesonide (PULMICORT) nebulizer solution  0.25 mg Nebulization BID   enoxaparin (LOVENOX) injection  40 mg Subcutaneous Q24H   umeclidinium bromide  1 puff Inhalation Daily   And   fluticasone furoate-vilanterol  1 puff Inhalation Daily   furosemide  40 mg Oral Daily   melatonin  3 mg Oral QHS   methylPREDNISolone (SOLU-MEDROL) injection  60 mg Intravenous Q12H   pantoprazole  40 mg Oral Daily   sertraline  50 mg Oral Daily   sodium chloride flush  3 mL Intravenous Q12H    PRN meds: sodium chloride, acetaminophen **OR** acetaminophen, albuterol, ALPRAZolam, diltiazem, guaiFENesin-dextromethorphan, ondansetron (ZOFRAN) IV, polyethylene glycol   Antimicrobials: Anti-infectives (From admission, onward)    Start     Dose/Rate Route Frequency Ordered Stop   02/05/22 1000  azithromycin (ZITHROMAX) tablet 250 mg       See Hyperspace for full Linked Orders Report.   250 mg Oral Daily 02/04/22 2105 02/09/22 0959   02/04/22 2200  azithromycin (ZITHROMAX) tablet 500 mg       See Hyperspace for full Linked Orders Report.   500 mg Oral Daily 02/04/22 2105 02/04/22 2109       Objective: Vitals:   02/06/22 1001 02/06/22 1026  BP: 114/70   Pulse: (!) 118   Resp:    Temp: (!) 100.7 F (38.2 C)   SpO2: 97% 98%    Intake/Output Summary  (Last 24 hours) at 02/06/2022 1043 Last data filed at 02/05/2022 2200 Gross per 24 hour  Intake 240 ml  Output --  Net 240 ml   Filed Weights   02/04/22 0947 02/05/22 0500 02/06/22 0500  Weight: 66.2 kg 65.8 kg 65.8 kg   Weight change: -0.4 kg Body mass index is 24.9 kg/m.   Physical Exam: General exam: Pleasant, elderly Caucasian male.  On mild to moderate respite distress this morning Skin: No rashes, lesions or ulcers. HEENT: Atraumatic, normocephalic, no obvious bleeding Lungs: Diffuse bilateral wheezing and coughs on deep breathing CVS: Tachycardic, no murmur GI/Abd soft, nontender, nondistended, bowel sound present CNS: Alert, awake, oriented x3 Psychiatry: Anxious Extremities: No pedal edema, no calf tenderness  Data Review: I have personally reviewed the laboratory data and studies available.  F/u labs ordered [employer] (From admission, onward)     Start  Ordered   02/11/22 0500  Creatinine, serum  (enoxaparin (LOVENOX)    CrCl >/= 30 ml/min)  Weekly,   R     Comments: while on enoxaparin therapy   Question:  Specimen collection method  Answer:  Lab=Lab collect   02/04/22 1828            Signed, Terrilee Croak, MD [facility] 02/06/2022

## 2022-02-07 ENCOUNTER — Ambulatory Visit: Payer: Medicare Other | Admitting: Adult Health

## 2022-02-07 LAB — GLUCOSE, CAPILLARY
Glucose-Capillary: 153 mg/dL — ABNORMAL HIGH (ref 70–99)
Glucose-Capillary: 167 mg/dL — ABNORMAL HIGH (ref 70–99)
Glucose-Capillary: 236 mg/dL — ABNORMAL HIGH (ref 70–99)
Glucose-Capillary: 148 mg/dL — ABNORMAL HIGH (ref 70–99)

## 2022-02-07 NOTE — Plan of Care (Signed)
  Problem: Education: Goal: Knowledge of disease or condition will improve Outcome: Progressing Goal: Knowledge of the prescribed therapeutic regimen will improve Outcome: Progressing   Problem: Activity: Goal: Ability to tolerate increased activity will improve Outcome: Progressing Goal: Will verbalize the importance of balancing activity with adequate rest periods Outcome: Progressing   Problem: Respiratory: Goal: Ability to maintain a clear airway will improve Outcome: Progressing Goal: Levels of oxygenation will improve Outcome: Progressing Goal: Ability to maintain adequate ventilation will improve Outcome: Progressing   

## 2022-02-07 NOTE — Progress Notes (Signed)
SATURATION QUALIFICATIONS: (This note is used to comply with regulatory documentation for home oxygen)  Patient Saturations on Room Air at Rest = 92%  Patient Saturations on while Ambulating = 97%  Liters of oxygen while Ambulating = 2L  Patient Saturations on room air ambulating - 89%

## 2022-02-07 NOTE — Progress Notes (Signed)
°   02/07/22 1344  Oxygen Therapy  SpO2 91 %  O2 Device Room Air  Patient Activity (if Appropriate) Ambulating  Mobility  Activity Ambulated with assistance in hallway  Level of Assistance Standby assist, set-up cues, supervision of patient - no hands on  Assistive Device Other (Comment) (IV pole)  Distance Ambulated (ft) 300 ft  Activity Response Tolerated well  $Mobility charge 1 Mobility   Pt agreeable to mobilize this afternoon. Ambulated about 262ft in hall with IV pole, tolerated well. No complaints. Left pt in bed, call bell at side. RN/NT notified of session.   [place] Specialist Acute Rehab Services Office: [phone]

## 2022-02-07 NOTE — TOC Transition Note (Signed)
Transition of Care [facility]) - CM/SW Discharge Note   Patient Details  Name: RICHARDSON DUBREE MRN: [id number] Date of Birth: 1946-06-09  Transition of Care [facility]) CM/SW Contact:  Dessa Phi, RN Phone Number: 02/07/2022, 12:50 PM   Clinical Narrative: Patient active w/[facility] home 02 HS. Noted doesn't qualify for home 02 continuous. Has travel tank in rm. Has own transport home. No further CM needs.      Final next level of care: Home/Self Care Barriers to Discharge: No Barriers Identified   Patient Goals and CMS Choice Patient states their goals for this hospitalization and ongoing recovery are:: home CMS Medicare.gov Compare Post Acute Care list provided to:: Patient Choice offered to / list presented to : Patient  Discharge Placement                       Discharge Plan and Services   Discharge Planning Services: CM Consult Post Acute Care Choice: Durable Medical Equipment (home 02-[facility] HS.)                               Social Determinants of Health (SDOH) Interventions     Readmission Risk Interventions Readmission Risk Prevention Plan 01/01/2022 01/15/2021 04/05/2020  Transportation Screening Complete Complete Complete  PCP or Specialist Appt within 3-5 Days Complete - -  Not Complete comments - - -  [place] or Home Care Consult Complete - -  Social Work Consult for [place] Planning/Counseling Complete - -  Palliative Care Screening Complete - -  Medication Review Press photographer) Complete Complete Complete  PCP or Specialist appointment within 3-5 days of discharge - Complete Complete  [place] or [place] - Complete Complete  SW Recovery Care/Counseling Consult - Complete Complete  Palliative Care Screening - Not Applicable Complete  [place] - Not Applicable Not Applicable  Some recent data might be hidden

## 2022-02-07 NOTE — Progress Notes (Signed)
PROGRESS NOTE  Paul [name]  DOB: 06-19-1946  PCP: [name], MD XFG:[id number]  DOA: 02/04/2022  LOS: 1 day  Hospital Day: 4  Brief narrative: Paul [name] is a 76 y.o. male with PMH significant for COPD on 3 L oxygen at home, chronically on prednisone, DM2, HTN, HLD, GERD, A-fib status post Watchman device in the past, anxiety, recurrent COPD exacerbations and hospitalizations in the past. Patient presented to the ED on 02/04/2022 with complaint of progressively worsening shortness of breath, cough, wheezing, productive sputum for 4 to 5 days. His previous hospitalization was from 1/5 to 01/04/2022 for COPD exacerbation, RSV infection.  He was seen by pulmonary at that time.  In the ED, patient was tachypneic, had diffuse wheezing. Chest x-ray did not show any acute findings. CBC showed mild leukocytosis. Patient was started on IV steroids, bronchodilators and admitted to hospitalist service.  Subjective: Patient was seen and examined morning.  Sitting up in chair.  Not in distress.  On room air at rest.  On ambulation this morning, he still required 4 L of oxygen and felt bad.  Overall he thinks he is getting better compared to yesterday.  Principal Problem:   COPD with acute exacerbation ([place]) Active Problems:   HLD (hyperlipidemia)   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HCC)   Essential hypertension   Chronic diastolic CHF (congestive heart failure) (HCC)   Paroxysmal atrial fibrillation (HCC)   Oxygen dependent   GERD (gastroesophageal reflux disease) / GI protection on antiplatelet therapy    Assessment and Plan: COPD with acute exacerbation ([place])- (present on admission) Acute on chronic respiratory failure with hypoxia Rhinovirus infection -Present with progressive worsening shortness of breath, cough, wheezing.  RVP positive for rhinovirus.  -He was recently admitted for RSV infection. On 1/13, he was discharged on tapering course of prednisone.  Patient  states he chronically is on prednisone 5 mg daily and follows up with Dr. Melvyn Novas as an outpatient. -Improving on IV Solu-Medrol 60 mg twice daily.  I will continue the same for today.  No recurrence of fever after an episode of 100.7 yesterday.  Currently on IV Rocephin and Zithromax. -Continue LABA/ICS, albuterol, Robitussin.  -Chronically on 2 L oxygen.  Encourage ambulation again, would need higher flow oxygen for that.  Chronic diastolic CHF Essential hypertension -Last echo from 08/2021 with EF 60 to 65%.   -Continue Cardizem, bisoprolol, Lasix.  Paroxysmal A-fib History of Maze procedure  -Continue bisoprolol 5 mg daily, Cardizem 60 mg every 6 hours as needed. Not on anticoagulation  Type 2 diabetes mellitus with hyperglycemia -A1c 6.5 on 12/27/2021.  Diabetes controlled but currently hyperglycemic because of steroids -Not on meds at home -Currently on sliding scale insulin with Accu-Cheks Recent Labs  Lab 02/06/22 1118 02/06/22 1638 02/06/22 2055 02/07/22 0723 02/07/22 1115  GLUCAP 111* 166* 206* 153* 167*   Leukocytosis -WBC count running high because of steroids.  Continue to monitor.  Repeat labs tomorrow  Hyperlipidemia -Continue aspirin and Lipitor  GERD -Continue Protonix  Anxiety -Zoloft 50 mg daily, Xanax 0.25 mg daily as needed.   Mobility: Encourage ambulation. Goals of care   Code Status: Full Code    Nutritional status:  Body mass index is 24.68 kg/m.      Diet:  Diet Order             Diet heart healthy/carb modified Room service appropriate? Yes; Fluid consistency: Thin  Diet effective now  DVT prophylaxis:  enoxaparin (LOVENOX) injection 40 mg Start: 02/04/22 2200   Antimicrobials: Zithromax Fluid: None Consultants: None Family Communication: Patient's wife at bedside  Status is: Observation  Continue in-hospital care because: Continues to need IV steroids, further monitoring Level of care: Telemetry    Dispo: The patient is from: Home              Anticipated d/c is to: Hopefully home in next 1 to 2 days              Patient currently is not medically stable to d/c.   Difficult to place patient No     Infusions:   sodium chloride 10 mL/hr at 02/04/22 1432   cefTRIAXone (ROCEPHIN)  IV 1 g (02/06/22 1122)    Scheduled Meds:  albuterol  2.5 mg Nebulization Q6H   aspirin EC  81 mg Oral Daily   atorvastatin  40 mg Oral Daily   azithromycin  500 mg Oral Daily   bisoprolol  5 mg Oral Daily   budesonide (PULMICORT) nebulizer solution  0.25 mg Nebulization BID   enoxaparin (LOVENOX) injection  40 mg Subcutaneous Q24H   umeclidinium bromide  1 puff Inhalation Daily   And   fluticasone furoate-vilanterol  1 puff Inhalation Daily   furosemide  40 mg Oral Daily   melatonin  3 mg Oral QHS   methylPREDNISolone (SOLU-MEDROL) injection  60 mg Intravenous Q12H   pantoprazole  40 mg Oral Daily   sertraline  50 mg Oral Daily   sodium chloride flush  3 mL Intravenous Q12H    PRN meds: sodium chloride, acetaminophen **OR** acetaminophen, albuterol, ALPRAZolam, diltiazem, guaiFENesin-dextromethorphan, ondansetron (ZOFRAN) IV, polyethylene glycol   Antimicrobials: Anti-infectives (From admission, onward)    Start     Dose/Rate Route Frequency Ordered Stop   02/07/22 1000  azithromycin (ZITHROMAX) tablet 500 mg        500 mg Oral Daily 02/06/22 1049 02/09/22 0959   02/06/22 1200  cefTRIAXone (ROCEPHIN) 1 g in sodium chloride 0.9 % 100 mL IVPB        1 g 200 mL/hr over 30 Minutes Intravenous Every 24 hours 02/06/22 1049     02/06/22 1200  azithromycin (ZITHROMAX) tablet 250 mg        250 mg Oral  Once 02/06/22 1055 02/06/22 1116   02/05/22 1000  azithromycin (ZITHROMAX) tablet 250 mg  Status:  Discontinued       See Hyperspace for full Linked Orders Report.   250 mg Oral Daily 02/04/22 2105 02/06/22 1049   02/04/22 2200  azithromycin (ZITHROMAX) tablet 500 mg       See Hyperspace for  full Linked Orders Report.   500 mg Oral Daily 02/04/22 2105 02/04/22 2109       Objective: Vitals:   02/07/22 1033 02/07/22 1034  BP:    Pulse:    Resp:    Temp:    SpO2: 97% (!) 89%    Intake/Output Summary (Last 24 hours) at 02/07/2022 1129 Last data filed at 02/07/2022 1047 Gross per 24 hour  Intake 440 ml  Output 200 ml  Net 240 ml   Filed Weights   02/05/22 0500 02/06/22 0500 02/07/22 0500  Weight: 65.8 kg 65.8 kg 65.2 kg   Weight change: -0.59 kg Body mass index is 24.68 kg/m.   Physical Exam: General exam: Pleasant, elderly Caucasian male.  Improved respiratory status this morning Skin: No rashes, lesions or ulcers. HEENT: Atraumatic, normocephalic, no obvious bleeding Lungs: Mild  scattered wheezing bilaterally, no cough on deep breathing today. CVS: Regular rate and rhythm, no murmur GI/Abd soft, nontender, nondistended, bowel sound present CNS: Alert, awake, oriented x3 Psychiatry: Anxious Extremities: No pedal edema, no calf tenderness  Data Review: I have personally reviewed the laboratory data and studies available.  F/u labs ordered Unresulted Labs (From admission, onward)     Start     Ordered   02/11/22 0500  Creatinine, serum  (enoxaparin (LOVENOX)    CrCl >/= 30 ml/min)  Weekly,   R     Comments: while on enoxaparin therapy   Question:  Specimen collection method  Answer:  Lab=Lab collect   02/04/22 1828   02/08/22 0500  CBC with Differential/Platelet  Tomorrow morning,   R       Question:  Specimen collection method  Answer:  Lab=Lab collect   02/07/22 1129   02/08/22 0352  Basic metabolic panel  Tomorrow morning,   R       Question:  Specimen collection method  Answer:  Lab=Lab collect   02/07/22 1129            Signed, Terrilee Croak, MD [facility] 02/07/2022

## 2022-02-08 LAB — CBC WITH DIFFERENTIAL/PLATELET
Abs Immature Granulocytes: 0.08 10*3/uL — ABNORMAL HIGH (ref 0.00–0.07)
Basophils Absolute: 0 10*3/uL (ref 0.0–0.1)
Basophils Relative: 0 %
Eosinophils Absolute: 0 10*3/uL (ref 0.0–0.5)
Eosinophils Relative: 0 %
HCT: 32.7 % — ABNORMAL LOW (ref 39.0–52.0)
Hemoglobin: 10 g/dL — ABNORMAL LOW (ref 13.0–17.0)
Immature Granulocytes: 1 %
Lymphocytes Relative: 3 %
Lymphs Abs: 0.4 10*3/uL — ABNORMAL LOW (ref 0.7–4.0)
MCH: 25.3 pg — ABNORMAL LOW (ref 26.0–34.0)
MCHC: 30.6 g/dL (ref 30.0–36.0)
MCV: 82.8 fL (ref 80.0–100.0)
Monocytes Absolute: 0.3 10*3/uL (ref 0.1–1.0)
Monocytes Relative: 2 %
Neutro Abs: 11.6 10*3/uL — ABNORMAL HIGH (ref 1.7–7.7)
Neutrophils Relative %: 94 %
Platelets: 344 10*3/uL (ref 150–400)
RBC: 3.95 MIL/uL — ABNORMAL LOW (ref 4.22–5.81)
RDW: 16.3 % — ABNORMAL HIGH (ref 11.5–15.5)
WBC: 12.3 10*3/uL — ABNORMAL HIGH (ref 4.0–10.5)
nRBC: 0 % (ref 0.0–0.2)

## 2022-02-08 LAB — BASIC METABOLIC PANEL
Anion gap: 8 (ref 5–15)
BUN: 31 mg/dL — ABNORMAL HIGH (ref 8–23)
CO2: 30 mmol/L (ref 22–32)
Calcium: 8.2 mg/dL — ABNORMAL LOW (ref 8.9–10.3)
Chloride: 100 mmol/L (ref 98–111)
Creatinine, Ser: 0.81 mg/dL (ref 0.61–1.24)
GFR, Estimated: >60 mL/min (ref >60)
Glucose, Bld: 189 mg/dL — ABNORMAL HIGH (ref 70–99)
Potassium: 4.1 mmol/L (ref 3.5–5.1)
Sodium: 138 mmol/L (ref 135–145)

## 2022-02-08 LAB — GLUCOSE, CAPILLARY: Glucose-Capillary: 138 mg/dL — ABNORMAL HIGH (ref 70–99)

## 2022-02-08 MED ORDER — PREDNISONE 10 MG PO TABS: 40.0000 mg | ORAL_TABLET | Freq: Every day | ORAL | 0 refills | Status: AC

## 2022-02-08 NOTE — Discharge Summary (Addendum)
Physician Discharge Summary  Paul [name] [id number] DOB: Dec 30, 1945 DOA: 02/04/2022  PCP: [name], MD  Admit date: 02/04/2022 Discharge date: 02/08/2022  Admitted From: Home Disposition:  Home  Recommendations for Outpatient Follow-up:  Follow-up with PCP in 1 week Follow-up with pulmonology as scheduled Repeat CBC and BMP on follow-up visit with PCP Continue current inhalers  Home Health: None Equipment/Devices: None Discharge Condition: Stable CODE STATUS: Full code Diet recommendation: Heart healthy diet  Brief/Interim Summary: Paul [name] is a 76 y.o. male with PMH significant for COPD on 3 L oxygen at home, chronically on prednisone, DM2, HTN, HLD, GERD, A-fib status post Watchman device in the past, anxiety, recurrent COPD exacerbations and hospitalizations in the past. Patient presented to the ED on 02/04/2022 with complaint of progressively worsening shortness of breath, cough, wheezing, productive sputum for 4 to 5 days. His previous hospitalization was from 1/5 to 01/04/2022 for COPD exacerbation, RSV infection.  He was seen by pulmonary at that time.   In the ED, patient was tachypneic, had diffuse wheezing. Chest x-ray did not show any acute findings. CBC showed mild leukocytosis. Patient was started on IV steroids, bronchodilators and admitted to hospitalist service.  COPD with acute exacerbation ([place])- (present on admission) Acute on chronic respiratory failure with hypoxia Rhinovirus infection -Present with progressive worsening shortness of breath, cough, wheezing.  RVP positive for rhinovirus.  -He was recently admitted for RSV infection. On 1/13, he was discharged on tapering course of prednisone.  Patient states he chronically is on prednisone 5 mg daily and follows up with Dr. Melvyn Novas as an outpatient. -Continued LABA/ICS, albuterol, Robitussin.  -Chronically on 2 L oxygen.  -He finished  3 days of IV Rocephin and 5 days of azithromycin.  Received  steroids for 5 days while hospitalization. -Stable for discharge.  Will DC him on prednisone 40 mg once daily for 3 more days.  Chronic diastolic CHF Essential hypertension -Remained stable. -Last echo from 08/2021 with EF 60 to 65%.   -Continued Cardizem, bisoprolol, Lasix.   Paroxysmal A-fib History of Maze procedure  -Continued bisoprolol 5 mg daily, Cardizem 60 mg every 6 hours as needed. Not on anticoagulation   Type 2 diabetes mellitus with hyperglycemia -A1c 6.5 on 12/27/2021.  Diabetes controlled but currently hyperglycemic because of steroids -Not on meds at home -on sliding scale insulin with Accu-Cheks while hospitalization. -Follow-up with PCP outpatient  Leukocytosis: -WBC trending down from 20.4-12.3.  Likely secondary to steroids.  He remained afebrile overnight.  Finished IV antibiotic course.  Repeat CBC outpatient with PCP.  hyperlipidemia -Continued Lipitor   GERD -Remained stable.  Continued Protonix   Anxiety -Remained stable.  Continued Zoloft 50 mg daily, Xanax 0.25 mg daily as needed.  Discharge Diagnoses:  COPD with acute exacerbation.   Acute on chronic respiratory failure with hypoxia Rhinovirus infection Chronic diastolic CHF Essential hypertension Paroxysmal A-fib History of maze procedure Type 2 diabetes mellitus Leukocytosis Hyperlipidemia GERD Anxiety    Discharge Instructions  Discharge Instructions     Diet - low sodium heart healthy   Complete by: As directed    Increase activity slowly   Complete by: As directed       Allergies as of 02/08/2022       Reactions   Daliresp [roflumilast] Diarrhea, Nausea And Vomiting   Levaquin [levofloxacin] Palpitations   Made the B/P fluctuate and heart raced   Alendronate Anxiety, Other (See Comments)   Jittery/nervous        Medication List  STOP taking these medications    amoxicillin-clavulanate 875-125 MG tablet Commonly known as: AUGMENTIN   predniSONE 10 MG  tablet Commonly known as: DELTASONE       TAKE these medications    acetaminophen 325 MG tablet Commonly known as: TYLENOL Take 650 mg by mouth every 6 (six) hours as needed for mild pain or fever.   ALPRAZolam 0.25 MG tablet Commonly known as: XANAX Take 0.25 mg by mouth daily as needed for anxiety.   aspirin EC 81 MG tablet Take 1 tablet (81 mg total) by mouth daily. Swallow whole.   atorvastatin 40 MG tablet Commonly known as: LIPITOR TAKE 1 TABLET BY MOUTH ONCE DAILY AFTER SUPPER   bisoprolol 5 MG tablet Commonly known as: ZEBETA Take 1 tablet (5 mg total) by mouth daily.   Breztri Aerosphere 160-9-4.8 MCG/ACT Aero Generic drug: Budeson-Glycopyrrol-Formoterol Inhale 2 puffs into the lungs 2 (two) times daily.   calcium carbonate 600 MG Tabs tablet Commonly known as: OS-CAL Take 600 mg by mouth daily.   denosumab 60 MG/ML Sosy injection Commonly known as: PROLIA Inject 60 mg into the skin every 6 (six) months.   diltiazem 60 MG tablet Commonly known as: Cardizem Take 1 tablet (60 mg total) by mouth 4 (four) times daily as needed. What changed: reasons to take this   ferrous sulfate 325 (65 FE) MG tablet Take 325 mg by mouth 2 (two) times daily with a meal.   furosemide 40 MG tablet Commonly known as: LASIX Take 1 tablet (40 mg total) by mouth daily.   guaiFENesin 600 MG 12 hr tablet Commonly known as: MUCINEX Take 600 mg by mouth 2 (two) times daily.   loratadine 10 MG tablet Commonly known as: CLARITIN Take 10 mg by mouth in the morning.   multivitamin with minerals Tabs tablet Take 1 tablet by mouth daily.   NICOTINAMIDE PO Take 1 tablet by mouth in the morning and at bedtime.   OXYGEN Inhale 2-2.5 L/min into the lungs See admin instructions. Inhale 2 L/min of oxygen into the lungs at bedtime and when ambulatory   pantoprazole 40 MG tablet Commonly known as: PROTONIX Take 1 tablet by mouth once daily   ProAir HFA 108 (90 Base) MCG/ACT  inhaler Generic drug: albuterol Inhale 2 puffs into the lungs every 6 (six) hours as needed for wheezing or shortness of breath. What changed: Another medication with the same name was changed. Make sure you understand how and when to take each.   albuterol (2.5 MG/3ML) 0.083% nebulizer solution Commonly known as: PROVENTIL Take 3 mLs (2.5 mg total) by nebulization in the morning and at bedtime. What changed:  when to take this reasons to take this   sertraline 50 MG tablet Commonly known as: ZOLOFT Take 1 tablet (50 mg total) by mouth daily.   tiZANidine 2 MG tablet Commonly known as: ZANAFLEX Take 1 tablet (2 mg total) by mouth every 6 (six) hours as needed for muscle spasms.   vitamin C 1000 MG tablet Take 1,000 mg by mouth daily.   Vitamin D 50 MCG (2000 UT) Caps Take 2,000 Units by mouth daily.        Allergies  Allergen Reactions   Daliresp [Roflumilast] Diarrhea and Nausea And Vomiting   Levaquin [Levofloxacin] Palpitations    Made the B/P fluctuate and heart raced   Alendronate Anxiety and Other (See Comments)    Jittery/nervous    Consultations: None   Procedures/Studies: DG Chest 2 View  Result Date:  02/06/2022 CLINICAL DATA:  COPD.  Recent aspiration pneumonia and COVID. EXAM: CHEST - 2 VIEW COMPARISON:  02/04/2022 FINDINGS: Mid and lower thoracic compression deformities are suboptimally evaluated. Prior vertebral augmentation within a thoracolumbar junction vertebral body. Appearance is similar to 12/28/2021 Numerous leads and wires project over the chest. Apical lordotic AP frontal radiograph. Midline trachea. Normal heart size. Atherosclerosis in the transverse aorta. No pleural effusion or pneumothorax. Clear lungs. IMPRESSION: No acute cardiopulmonary disease. Electronically Signed   By: [name] Miyamoto M.D.   On: 02/06/2022 14:28   DG Chest Portable 1 View  Result Date: 02/04/2022 CLINICAL DATA:  Cough and fever. EXAM: PORTABLE CHEST 1 VIEW COMPARISON:   12/28/2021 FINDINGS: 1211 hours. The lungs are clear without focal pneumonia, edema, pneumothorax or pleural effusion. Probable tiny granuloma right upper lobe stable since 01/30/2021. The cardiopericardial silhouette is within normal limits for size. The visualized bony structures of the thorax show no acute abnormality. Telemetry leads overlie the chest. IMPRESSION: No active disease. Electronically Signed   By: Misty Stanley M.D.   On: 02/04/2022 12:21      Subjective: Patient seen and examined.  Sitting comfortably on the bed.  Reports that his breathing is better.  No further wheezing or shortness of breath.  Comfortable going home today.  He does not need refill on any of his inhalers.  No fever overnight.  Discharge Exam: Vitals:   02/08/22 0738 02/08/22 0756  BP:    Pulse:    Resp:    Temp:    SpO2: 97% 97%   Vitals:   02/08/22 0426 02/08/22 0737 02/08/22 0738 02/08/22 0756  BP: 114/69     Pulse: 88     Resp: 20     Temp: 97.8 F (36.6 C)     TempSrc: Oral     SpO2: 98% 97% 97% 97%  Weight:      Height:        General: Pt is alert, awake, not in acute distress, communicating well, on 2 L of oxygen via nasal cannula Cardiovascular: RRR, S1/S2 +, no rubs, no gallops Respiratory: CTA bilaterally, no wheezing, no rhonchi, no accessory muscle use. Abdominal: Soft, NT, ND, bowel sounds + Extremities: no edema, no cyanosis    The results of significant diagnostics from this hospitalization (including imaging, microbiology, ancillary and laboratory) are listed below for reference.     Microbiology: Recent Results (from the past 240 hour(s))  Resp Panel by RT-PCR (Flu A&B, Covid) Nasopharyngeal Swab     Status: None   Collection Time: 02/04/22 10:31 AM   Specimen: Nasopharyngeal Swab; Nasopharyngeal(NP) swabs in vial transport medium  Result Value Ref Range Status   SARS Coronavirus 2 by RT PCR NEGATIVE NEGATIVE Final    Comment: (NOTE) SARS-CoV-2 target nucleic acids  are NOT DETECTED.  The SARS-CoV-2 RNA is generally detectable in upper respiratory specimens during the acute phase of infection. The lowest concentration of SARS-CoV-2 viral copies this assay can detect is 138 copies/mL. A negative result does not preclude SARS-Cov-2 infection and should not be used as the sole basis for treatment or other patient management decisions. A negative result may occur with  improper specimen collection/handling, submission of specimen other than nasopharyngeal swab, presence of viral mutation(s) within the areas targeted by this assay, and inadequate number of viral copies(<138 copies/mL). A negative result must be combined with clinical observations, patient history, and epidemiological information. The expected result is Negative.  Fact Sheet for Patients:  EntrepreneurPulse.com.au  Fact Sheet  for Healthcare Providers:  IncredibleEmployment.be  This test is no t yet approved or cleared by the [place] and  has been authorized for detection and/or diagnosis of SARS-CoV-2 by FDA under an Emergency Use Authorization (EUA). This EUA will remain  in effect (meaning this test can be used) for the duration of the COVID-19 declaration under Section 564(b)(1) of the Act, 21 U.S.C.section 360bbb-3(b)(1), unless the authorization is terminated  or revoked sooner.       Influenza A by PCR NEGATIVE NEGATIVE Final   Influenza B by PCR NEGATIVE NEGATIVE Final    Comment: (NOTE) The Xpert Xpress SARS-CoV-2/FLU/RSV plus assay is intended as an aid in the diagnosis of influenza from Nasopharyngeal swab specimens and should not be used as a sole basis for treatment. Nasal washings and aspirates are unacceptable for Xpert Xpress SARS-CoV-2/FLU/RSV testing.  Fact Sheet for Patients: EntrepreneurPulse.com.au  Fact Sheet for Healthcare Providers: IncredibleEmployment.be  This test is  not yet approved or cleared by the [place] FDA and has been authorized for detection and/or diagnosis of SARS-CoV-2 by FDA under an Emergency Use Authorization (EUA). This EUA will remain in effect (meaning this test can be used) for the duration of the COVID-19 declaration under Section 564(b)(1) of the Act, 21 U.S.C. section 360bbb-3(b)(1), unless the authorization is terminated or revoked.  Performed at [facility], [address] [place], [place] [address]   Respiratory (~20 pathogens) panel by PCR     Status: Abnormal   Collection Time: 02/05/22  1:30 PM   Specimen: Nasopharyngeal Swab; Respiratory  Result Value Ref Range Status   Adenovirus NOT DETECTED NOT DETECTED Final   Coronavirus 229E NOT DETECTED NOT DETECTED Final    Comment: (NOTE) The Coronavirus on the Respiratory Panel, DOES NOT test for the novel  Coronavirus (2019 nCoV)    Coronavirus HKU1 NOT DETECTED NOT DETECTED Final   Coronavirus NL63 NOT DETECTED NOT DETECTED Final   Coronavirus OC43 NOT DETECTED NOT DETECTED Final   Metapneumovirus NOT DETECTED NOT DETECTED Final   Rhinovirus / Enterovirus DETECTED (A) NOT DETECTED Final   Influenza A NOT DETECTED NOT DETECTED Final   Influenza B NOT DETECTED NOT DETECTED Final   Parainfluenza Virus 1 NOT DETECTED NOT DETECTED Final   Parainfluenza Virus 2 NOT DETECTED NOT DETECTED Final   Parainfluenza Virus 3 NOT DETECTED NOT DETECTED Final   Parainfluenza Virus 4 NOT DETECTED NOT DETECTED Final   Respiratory Syncytial Virus NOT DETECTED NOT DETECTED Final   Bordetella pertussis NOT DETECTED NOT DETECTED Final   Bordetella Parapertussis NOT DETECTED NOT DETECTED Final   Chlamydophila pneumoniae NOT DETECTED NOT DETECTED Final   Mycoplasma pneumoniae NOT DETECTED NOT DETECTED Final    Comment: Performed at [facility], [place]. [address] [place], [place] [address]  Expectorated Sputum Assessment w Gram Stain, Rflx to Resp Cult     Status: None    Collection Time: 02/06/22  4:27 PM   Specimen: Sputum  Result Value Ref Range Status   Specimen Description SPUTUM  Final   Special Requests NONE  Final   Sputum evaluation   Final    THIS SPECIMEN IS ACCEPTABLE FOR SPUTUM CULTURE Performed at [facility], [place] [address], [place], [place] [address]    Report Status 02/06/2022 FINAL  Final  Culture, Respiratory w Gram Stain     Status: None (Preliminary result)   Collection Time: 02/06/22  4:27 PM   Specimen: SPU  Result Value  Ref Range Status   Specimen Description   Final    SPUTUM Performed at [place] [address] [place], [place] [address]    Special Requests   Final    NONE Reflexed from [id number] Performed at [facility], [place] [address] [place], [place] [address]    Gram Stain   Final    FEW WBC PRESENT, PREDOMINANTLY PMN RARE GRAM POSITIVE COCCI    Culture   Final    CULTURE REINCUBATED FOR BETTER GROWTH Performed at [facility], [place] [address], [place],  [address]    Report Status PENDING  Incomplete     Labs: BNP (last 3 results) Recent Labs    12/27/21 0924 02/04/22 1031  BNP 70.2 989.2*   Basic Metabolic Panel: Recent Labs  Lab 02/04/22 1031 02/05/22 0505 02/06/22 0528 02/08/22 0440  NA 135 135 139 138  K 3.8 4.3 4.3 4.1  CL 94* 97* 100 100  CO2 29 31 30 30   GLUCOSE 104* 128* 116* 189*  BUN 15 20 19  31*  CREATININE 0.86 0.85 0.75 0.81  CALCIUM 9.1 9.1 8.7* 8.2*   Liver Function Tests: Recent Labs  Lab 02/05/22 0505  AST 18  ALT 26  ALKPHOS 49  BILITOT 0.2*  PROT 6.7  ALBUMIN 3.1*   No results for input(s): LIPASE, AMYLASE in the last 168 hours. No results for input(s): AMMONIA in the last 168 hours. CBC: Recent Labs  Lab 02/04/22 1031 02/05/22 0505 02/06/22 0528 02/08/22 0440  WBC 16.4* 19.3* 20.4* 12.3*  NEUTROABS 14.5*  --   --  11.6*  HGB 12.0* 10.9* 11.0* 10.0*  HCT 38.2* 34.6* 35.6* 32.7*   MCV 81.1 81.8 83.0 82.8  PLT 327 302 326 344   Cardiac Enzymes: No results for input(s): CKTOTAL, CKMB, CKMBINDEX, TROPONINI in the last 168 hours. BNP: Invalid input(s): POCBNP CBG: Recent Labs  Lab 02/07/22 0723 02/07/22 1115 02/07/22 1757 02/07/22 2124 02/08/22 0738  GLUCAP 153* 167* 236* 148* 138*   D-Dimer No results for input(s): DDIMER in the last 72 hours. Hgb A1c No results for input(s): HGBA1C in the last 72 hours. Lipid Profile No results for input(s): CHOL, HDL, LDLCALC, TRIG, CHOLHDL, LDLDIRECT in the last 72 hours. Thyroid function studies No results for input(s): TSH, T4TOTAL, T3FREE, THYROIDAB in the last 72 hours.  Invalid input(s): FREET3 Anemia work up No results for input(s): VITAMINB12, FOLATE, FERRITIN, TIBC, IRON, RETICCTPCT in the last 72 hours. Urinalysis    Component Value Date/Time   COLORURINE YELLOW 01/14/2021 1100   APPEARANCEUR CLEAR 01/14/2021 1100   LABSPEC 1.015 01/14/2021 1100   PHURINE 7.5 01/14/2021 1100   GLUCOSEU NEGATIVE 01/14/2021 1100   HGBUR MODERATE (A) 01/14/2021 1100   BILIRUBINUR Negative 05/10/2021 0931   KETONESUR NEGATIVE 01/14/2021 1100   PROTEINUR Positive (A) 05/10/2021 0931   PROTEINUR NEGATIVE 01/14/2021 1100   UROBILINOGEN negative (A) 05/10/2021 0931   NITRITE Negative 05/10/2021 0931   NITRITE NEGATIVE 01/14/2021 1100   LEUKOCYTESUR Negative 05/10/2021 0931   LEUKOCYTESUR NEGATIVE 01/14/2021 1100   Sepsis Labs Invalid input(s): PROCALCITONIN,  WBC,  LACTICIDVEN Microbiology Recent Results (from the past 240 hour(s))  Resp Panel by RT-PCR (Flu A&B, Covid) Nasopharyngeal Swab     Status: None   Collection Time: 02/04/22 10:31 AM   Specimen: Nasopharyngeal Swab; Nasopharyngeal(NP) swabs in vial transport medium  Result Value Ref Range Status   SARS Coronavirus 2 by RT PCR NEGATIVE NEGATIVE Final  Comment: (NOTE) SARS-CoV-2 target nucleic acids are NOT DETECTED.  The SARS-CoV-2 RNA is generally  detectable in upper respiratory specimens during the acute phase of infection. The lowest concentration of SARS-CoV-2 viral copies this assay can detect is 138 copies/mL. A negative result does not preclude SARS-Cov-2 infection and should not be used as the sole basis for treatment or other patient management decisions. A negative result may occur with  improper specimen collection/handling, submission of specimen other than nasopharyngeal swab, presence of viral mutation(s) within the areas targeted by this assay, and inadequate number of viral copies(<138 copies/mL). A negative result must be combined with clinical observations, patient history, and epidemiological information. The expected result is Negative.  Fact Sheet for Patients:  EntrepreneurPulse.com.au  Fact Sheet for Healthcare Providers:  IncredibleEmployment.be  This test is no t yet approved or cleared by the [place] FDA and  has been authorized for detection and/or diagnosis of SARS-CoV-2 by FDA under an Emergency Use Authorization (EUA). This EUA will remain  in effect (meaning this test can be used) for the duration of the COVID-19 declaration under Section 564(b)(1) of the Act, 21 U.S.C.section 360bbb-3(b)(1), unless the authorization is terminated  or revoked sooner.       Influenza A by PCR NEGATIVE NEGATIVE Final   Influenza B by PCR NEGATIVE NEGATIVE Final    Comment: (NOTE) The Xpert Xpress SARS-CoV-2/FLU/RSV plus assay is intended as an aid in the diagnosis of influenza from Nasopharyngeal swab specimens and should not be used as a sole basis for treatment. Nasal washings and aspirates are unacceptable for Xpert Xpress SARS-CoV-2/FLU/RSV testing.  Fact Sheet for Patients: EntrepreneurPulse.com.au  Fact Sheet for Healthcare Providers: IncredibleEmployment.be  This test is not yet approved or cleared by the [place] FDA  and has been authorized for detection and/or diagnosis of SARS-CoV-2 by FDA under an Emergency Use Authorization (EUA). This EUA will remain in effect (meaning this test can be used) for the duration of the COVID-19 declaration under Section 564(b)(1) of the Act, 21 U.S.C. section 360bbb-3(b)(1), unless the authorization is terminated or revoked.  Performed at [facility], [address] [place], [place] [address]   Respiratory (~20 pathogens) panel by PCR     Status: Abnormal   Collection Time: 02/05/22  1:30 PM   Specimen: Nasopharyngeal Swab; Respiratory  Result Value Ref Range Status   Adenovirus NOT DETECTED NOT DETECTED Final   Coronavirus 229E NOT DETECTED NOT DETECTED Final    Comment: (NOTE) The Coronavirus on the Respiratory Panel, DOES NOT test for the novel  Coronavirus (2019 nCoV)    Coronavirus HKU1 NOT DETECTED NOT DETECTED Final   Coronavirus NL63 NOT DETECTED NOT DETECTED Final   Coronavirus OC43 NOT DETECTED NOT DETECTED Final   Metapneumovirus NOT DETECTED NOT DETECTED Final   Rhinovirus / Enterovirus DETECTED (A) NOT DETECTED Final   Influenza A NOT DETECTED NOT DETECTED Final   Influenza B NOT DETECTED NOT DETECTED Final   Parainfluenza Virus 1 NOT DETECTED NOT DETECTED Final   Parainfluenza Virus 2 NOT DETECTED NOT DETECTED Final   Parainfluenza Virus 3 NOT DETECTED NOT DETECTED Final   Parainfluenza Virus 4 NOT DETECTED NOT DETECTED Final   Respiratory Syncytial Virus NOT DETECTED NOT DETECTED Final   Bordetella pertussis NOT DETECTED NOT DETECTED Final   Bordetella Parapertussis NOT DETECTED NOT DETECTED Final   Chlamydophila pneumoniae NOT DETECTED NOT DETECTED Final   Mycoplasma pneumoniae NOT DETECTED NOT DETECTED Final    Comment: Performed at  [facility], [place] [address] [place], [place] [address]  Expectorated Sputum Assessment w Gram Stain, Rflx to Resp Cult     Status: None   Collection Time: 02/06/22  4:27 PM   Specimen:  Sputum  Result Value Ref Range Status   Specimen Description SPUTUM  Final   Special Requests NONE  Final   Sputum evaluation   Final    THIS SPECIMEN IS ACCEPTABLE FOR SPUTUM CULTURE Performed at [facility], [place] [address] [place], [place] [address]    Report Status 02/06/2022 FINAL  Final  Culture, Respiratory w Gram Stain     Status: None (Preliminary result)   Collection Time: 02/06/22  4:27 PM   Specimen: SPU  Result Value Ref Range Status   Specimen Description   Final    SPUTUM Performed at [place] [address] [place], [place] [address]    Special Requests   Final    NONE Reflexed from [id number] Performed at [facility] [place] [address] [place], [place] [address]    Gram Stain   Final    FEW WBC PRESENT, PREDOMINANTLY PMN RARE GRAM POSITIVE COCCI    Culture   Final    CULTURE REINCUBATED FOR BETTER GROWTH Performed at [facility], [place] [address], [place],  [address]    Report Status PENDING  Incomplete     Time coordinating discharge: Over 30 minutes  SIGNED:   Mckinley Jewel, MD  [facility] 02/08/2022, 10:35 AM Pager   If 7PM-7AM, please contact night-coverage [email or web address]

## 2022-02-09 LAB — CULTURE, RESPIRATORY W GRAM STAIN: Culture: NORMAL

## 2022-02-11 ENCOUNTER — Other Ambulatory Visit: Payer: Medicare Other

## 2022-02-11 ENCOUNTER — Other Ambulatory Visit: Payer: Self-pay

## 2022-02-11 DIAGNOSIS — E1169 Type 2 diabetes mellitus with other specified complication: Secondary | ICD-10-CM

## 2022-02-11 DIAGNOSIS — I10 Essential (primary) hypertension: Secondary | ICD-10-CM | POA: Diagnosis not present

## 2022-02-11 DIAGNOSIS — E782 Mixed hyperlipidemia: Secondary | ICD-10-CM | POA: Diagnosis not present

## 2022-02-11 MED ORDER — ALBUTEROL SULFATE (2.5 MG/3ML) 0.083% IN NEBU: 2.5000 mg | INHALATION_SOLUTION | Freq: Four times a day (QID) | RESPIRATORY_TRACT | 5 refills | Status: DC | PRN

## 2022-02-12 LAB — COMPREHENSIVE METABOLIC PANEL
ALT: 33 IU/L (ref 0–44)
AST: 23 IU/L (ref 0–40)
Albumin/Globulin Ratio: 1.6 (ref 1.2–2.2)
Albumin: 3.4 g/dL — ABNORMAL LOW (ref 3.7–4.7)
Alkaline Phosphatase: 78 IU/L (ref 44–121)
BUN/Creatinine Ratio: 23 (ref 10–24)
BUN: 17 mg/dL (ref 8–27)
Bilirubin Total: <0.2 mg/dL (ref 0.0–1.2)
CO2: 29 mmol/L (ref 20–29)
Calcium: 8.9 mg/dL (ref 8.6–10.2)
Chloride: 104 mmol/L (ref 96–106)
Creatinine, Ser: 0.73 mg/dL — ABNORMAL LOW (ref 0.76–1.27)
Globulin, Total: 2.1 g/dL (ref 1.5–4.5)
Glucose: 85 mg/dL (ref 70–99)
Potassium: 3.8 mmol/L (ref 3.5–5.2)
Sodium: 146 mmol/L — ABNORMAL HIGH (ref 134–144)
Total Protein: 5.5 g/dL — ABNORMAL LOW (ref 6.0–8.5)
eGFR: 94 mL/min/{1.73_m2} (ref >59)

## 2022-02-12 LAB — CBC WITH DIFFERENTIAL/PLATELET
Basophils Absolute: 0 10*3/uL (ref 0.0–0.2)
Basos: 0 %
EOS (ABSOLUTE): 0.1 10*3/uL (ref 0.0–0.4)
Eos: 1 %
Hematocrit: 35.5 % — ABNORMAL LOW (ref 37.5–51.0)
Hemoglobin: 11.2 g/dL — ABNORMAL LOW (ref 13.0–17.7)
Immature Grans (Abs): 0.4 10*3/uL — ABNORMAL HIGH (ref 0.0–0.1)
Immature Granulocytes: 3 %
Lymphocytes Absolute: 3.1 10*3/uL (ref 0.7–3.1)
Lymphs: 18 %
MCH: 25.6 pg — ABNORMAL LOW (ref 26.6–33.0)
MCHC: 31.5 g/dL (ref 31.5–35.7)
MCV: 81 fL (ref 79–97)
Monocytes Absolute: 0.8 10*3/uL (ref 0.1–0.9)
Monocytes: 5 %
Neutrophils Absolute: 12.2 10*3/uL — ABNORMAL HIGH (ref 1.4–7.0)
Neutrophils: 73 %
Platelets: 432 10*3/uL (ref 150–450)
RBC: 4.38 x10E6/uL (ref 4.14–5.80)
RDW: 15.2 % (ref 11.6–15.4)
WBC: 16.6 10*3/uL — ABNORMAL HIGH (ref 3.4–10.8)

## 2022-02-12 LAB — LIPID PANEL
Chol/HDL Ratio: 2.7 ratio (ref 0.0–5.0)
Cholesterol, Total: 146 mg/dL (ref 100–199)
HDL: 54 mg/dL (ref >39)
LDL Chol Calc (NIH): 73 mg/dL (ref 0–99)
Triglycerides: 104 mg/dL (ref 0–149)
VLDL Cholesterol Cal: 19 mg/dL (ref 5–40)

## 2022-02-12 LAB — HEMOGLOBIN A1C
Est. average glucose Bld gHb Est-mCnc: 140 mg/dL
Hgb A1c MFr Bld: 6.5 % — ABNORMAL HIGH (ref 4.8–5.6)

## 2022-02-12 LAB — CARDIOVASCULAR RISK ASSESSMENT

## 2022-02-13 ENCOUNTER — Ambulatory Visit: Payer: Medicare Other | Admitting: Physician Assistant

## 2022-02-13 ENCOUNTER — Ambulatory Visit (INDEPENDENT_AMBULATORY_CARE_PROVIDER_SITE_OTHER): Payer: Medicare Other | Admitting: Physician Assistant

## 2022-02-13 ENCOUNTER — Encounter: Payer: Self-pay | Admitting: Physician Assistant

## 2022-02-13 VITALS — BP 112/60 | HR 81 | Temp 97.8°F | Ht 64.0 in | Wt 148.8 lb

## 2022-02-13 DIAGNOSIS — K529 Noninfective gastroenteritis and colitis, unspecified: Secondary | ICD-10-CM

## 2022-02-13 DIAGNOSIS — J441 Chronic obstructive pulmonary disease with (acute) exacerbation: Secondary | ICD-10-CM

## 2022-02-13 DIAGNOSIS — R509 Fever, unspecified: Secondary | ICD-10-CM | POA: Diagnosis not present

## 2022-02-13 DIAGNOSIS — R197 Diarrhea, unspecified: Secondary | ICD-10-CM

## 2022-02-13 LAB — POCT INFLUENZA A/B
Influenza A, POC: NEGATIVE
Influenza B, POC: NEGATIVE

## 2022-02-13 LAB — POC COVID19 BINAXNOW: SARS Coronavirus 2 Ag: NEGATIVE

## 2022-02-13 MED ORDER — CIPROFLOXACIN HCL 500 MG PO TABS: 500.0000 mg | ORAL_TABLET | Freq: Two times a day (BID) | ORAL | 0 refills | Status: AC

## 2022-02-13 NOTE — Progress Notes (Signed)
Acute Office Visit  Subjective:    Patient ID: Paul [name], male    DOB: Apr 20, 1946, 76 y.o.   MRN: [id number]  Chief Complaint  Patient presents with   Fever   Heartburn   Diarrhea    HPI: Patient is in today for complaints of fever up to 101.3 this morning along with malaise , nausea and diarrhea.  He states his stomach has felt bloated as well.  He had some vomiting over the weekend but that has resolved.. he denies melena or hematochezia  Of note pt was in the hospital last week and treated with IV antibiotics for acute exacerbation of COPD.  Patient states his cough has returned and is productive  Past Medical History:  Diagnosis Date   Anemia    Aortic atherosclerosis (HCC)    Aspiration pneumonia due to inhalation of vomitus ([place]) 02/03/2022   Atrial fibrillation (HCC)    Back pain    COPD (chronic obstructive pulmonary disease) (HCC)    COPD exacerbation ([place]) 12/27/2021   COVID-19 12/24/2020   Diabetes mellitus without complication (HCC)    Elevated PSA    GAD (generalized anxiety disorder)    GERD (gastroesophageal reflux disease)    High cholesterol    Hypertension    Lingular pneumonia 01/05/2021   See cxr 01/06/20 rx with zpak/ cefipime x 5 days and then developed purulent sputum again 01/12/21 so rx levcaquin 500 mg daily x 7 days then return for cxr before more    Lung nodule < 6cm on CT 05/29/2016   Osteoporosis    Oxygen deficiency    Pneumonia    January 2022   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 Quentin Ore with a 20 mm Watchman FLX device   Vertebral compression fracture ([place]) 05/29/2019   T8 compression fracture noted on CT scan 05/28/2019 inpatient with chronic steroid dependent COPD - rx calcitonin nasal spray rx per PCP     Past Surgical History:  Procedure Laterality Date   CATARACT EXTRACTION Right    IR KYPHO EA ADDL LEVEL THORACIC OR LUMBAR  11/28/2021   LEFT ATRIAL APPENDAGE OCCLUSION N/A 08/02/2021    Procedure: LEFT ATRIAL APPENDAGE OCCLUSION;  Surgeon: Sherren Mocha, MD;  Location: [facility];  Service: Cardiovascular;  Laterality: N/A;   SKIN SURGERY     shoulders and chest   TEE WITHOUT CARDIOVERSION N/A 08/02/2021   Procedure: TRANSESOPHAGEAL ECHOCARDIOGRAM (TEE);  Surgeon: Sherren Mocha, MD;  Location: [facility];  Service: Cardiovascular;  Laterality: N/A;   TEE WITHOUT CARDIOVERSION N/A 09/13/2021   Procedure: TRANSESOPHAGEAL ECHOCARDIOGRAM (TEE);  Surgeon: Sanda Klein, MD;  Location: [facility];  Service: Cardiovascular;  Laterality: N/A;    Family History  Problem Relation Age of Onset   Heart disease Brother    Heart disease Mother    Heart disease Father    Cancer Paternal Uncle     Social History   Socioeconomic History   Marital status: Married    Spouse name: Not on file   Number of children: 3   Years of education: Not on file   Highest education level: Not on file  Occupational History   Occupation: retired    Comment: truck driver  Tobacco Use   Smoking status: Former    Packs/day: 2.00    Years: 52.00    Pack years: 104.00    Types: Cigarettes    Quit date: 02/03/2009  Years since quitting: 13.0   Smokeless tobacco: Never   Tobacco comments:    Counseled to remain smoke free  Vaping Use   Vaping Use: Never used  Substance and Sexual Activity   Alcohol use: Not Currently    Alcohol/week: 0.0 standard drinks    Comment: occ   Drug use: No   Sexual activity: Not on file  Other Topics Concern   Not on file  Social History Narrative   Originally from [place]. Previously worked driving a Restaurant manager, fast food. No pets currently. No mold exposure. 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       wears sunscreen, brushes and flosses daily, see's dentist bi-annually, has smoke/carbon monoxide detectors, wears a seatbelt and practices gun safety      Social Determinants of  Health   Financial Resource Strain: High Risk   Difficulty of Paying Living Expenses: Hard  Food Insecurity: Not on file  Transportation Needs: Not on file  Physical Activity: Not on file  Stress: Not on file  Social Connections: Not on file  Intimate Partner Violence: Not on file    Outpatient Medications Prior to Visit  Medication Sig Dispense Refill   acetaminophen (TYLENOL) 325 MG tablet Take 650 mg by mouth every 6 (six) hours as needed for mild pain or fever.     albuterol (PROAIR HFA) 108 (90 Base) MCG/ACT inhaler Inhale 2 puffs into the lungs every 6 (six) hours as needed for wheezing or shortness of breath.      albuterol (PROVENTIL) (2.5 MG/3ML) 0.083% nebulizer solution Take 3 mLs (2.5 mg total) by nebulization every 6 (six) hours as needed for shortness of breath or wheezing. 360 mL 5   ALPRAZolam (XANAX) 0.25 MG tablet Take 0.25 mg by mouth daily as needed for anxiety.     Ascorbic Acid (VITAMIN C) 1000 MG tablet Take 1,000 mg by mouth daily.     aspirin EC 81 MG tablet Take 1 tablet (81 mg total) by mouth daily. Swallow whole. 90 tablet 3   atorvastatin (LIPITOR) 40 MG tablet TAKE 1 TABLET BY MOUTH ONCE DAILY AFTER SUPPER 90 tablet 0   bisoprolol (ZEBETA) 5 MG tablet Take 1 tablet (5 mg total) by mouth daily. 90 tablet 3   Budeson-Glycopyrrol-Formoterol (BREZTRI AEROSPHERE) 160-9-4.8 MCG/ACT AERO Inhale 2 puffs into the lungs 2 (two) times daily. 32.1 g 3   calcium carbonate (OS-CAL) 600 MG TABS tablet Take 600 mg by mouth daily.     Cholecalciferol (VITAMIN D) 50 MCG (2000 UT) CAPS Take 2,000 Units by mouth daily.     denosumab (PROLIA) 60 MG/ML SOSY injection Inject 60 mg into the skin every 6 (six) months.     diltiazem (CARDIZEM) 60 MG tablet Take 1 tablet (60 mg total) by mouth 4 (four) times daily as needed. (Patient taking differently: Take 60 mg by mouth 4 (four) times daily as needed (afib).) 30 tablet prn   ferrous sulfate 325 (65 FE) MG tablet Take 325 mg by mouth  2 (two) times daily with a meal.     furosemide (LASIX) 40 MG tablet Take 1 tablet (40 mg total) by mouth daily. 90 tablet 3   guaiFENesin (MUCINEX) 600 MG 12 hr tablet Take 600 mg by mouth 2 (two) times daily.     loratadine (CLARITIN) 10 MG tablet Take 10 mg by mouth in the morning.     Multiple Vitamin (MULTIVITAMIN WITH MINERALS) TABS  tablet Take 1 tablet by mouth daily.     Niacinamide-Zn-Cu-Methfo-Se-Cr (NICOTINAMIDE PO) Take 1 tablet by mouth in the morning and at bedtime.     OXYGEN Inhale 2-2.5 L/min into the lungs See admin instructions. Inhale 2 L/min of oxygen into the lungs at bedtime and when ambulatory     pantoprazole (PROTONIX) 40 MG tablet Take 1 tablet by mouth once daily 90 tablet 3   sertraline (ZOLOFT) 50 MG tablet Take 1 tablet (50 mg total) by mouth daily. 90 tablet 1   tiZANidine (ZANAFLEX) 2 MG tablet Take 1 tablet (2 mg total) by mouth every 6 (six) hours as needed for muscle spasms. 30 tablet 0   No facility-administered medications prior to visit.    Allergies  Allergen Reactions   Daliresp [Roflumilast] Diarrhea and Nausea And Vomiting   Levaquin [Levofloxacin] Palpitations    Made the B/P fluctuate and heart raced   Alendronate Anxiety and Other (See Comments)    Jittery/nervous    Review of Systems CONSTITUTIONAL: see HPI E/N/T: Negative for ear pain, nasal congestion and sore throat.  CARDIOVASCULAR: Negative for chest pain, dizziness, palpitations and pedal edema.  RESPIRATORY: see HPI GASTROINTESTINAL: see HPI GU - negative NEUROLOGICAL: Negative for dizziness and headaches.          Objective:    PHYSICAL EXAM:   VS: BP 112/60    Pulse 81    Temp 97.8 F (36.6 C)    Ht 5' 4"  (1.626 m)    Wt 148 lb 12.8 oz (67.5 kg)    SpO2 99%    BMI 25.54 kg/m   GEN: Well nourished, well developed, in no acute distress - has oxygen Oropharynx - normal mucosa, palate, and posterior pharynx Cardiac: RRR; no murmurs,  Respiratory:  rales noted in LLL -  scattered wheezes noted clearing with cough GI: normal bowel sounds, no masses or tenderness Skin: warm and dry, no rash   Office Visit on 02/13/2022  Component Date Value Ref Range Status   SARS Coronavirus 2 Ag 02/13/2022 Negative  Negative Final   Influenza A, POC 02/13/2022 Negative  Negative Final   Influenza B, POC 02/13/2022 Negative  Negative Final     Health Maintenance Due  Topic Date Due   OPHTHALMOLOGY EXAM  Never done   Hepatitis C Screening  Never done   TETANUS/TDAP  Never done   Zoster Vaccines- Shingrix (2 of 2) 05/30/2014    There are no preventive care reminders to display for this patient.   Lab Results  Component Value Date   TSH 2.539 03/22/2019   Lab Results  Component Value Date   WBC 16.6 (H) 02/11/2022   HGB 11.2 (L) 02/11/2022   HCT 35.5 (L) 02/11/2022   MCV 81 02/11/2022   PLT 432 02/11/2022   Lab Results  Component Value Date   NA 146 (H) 02/11/2022   K 3.8 02/11/2022   CO2 29 02/11/2022   GLUCOSE 85 02/11/2022   BUN 17 02/11/2022   CREATININE 0.73 (L) 02/11/2022   BILITOT <0.2 02/11/2022   ALKPHOS 78 02/11/2022   AST 23 02/11/2022   ALT 33 02/11/2022   PROT 5.5 (L) 02/11/2022   ALBUMIN 3.4 (L) 02/11/2022   CALCIUM 8.9 02/11/2022   ANIONGAP 8 02/08/2022   EGFR 94 02/11/2022   Lab Results  Component Value Date   CHOL 146 02/11/2022   Lab Results  Component Value Date   HDL 54 02/11/2022   Lab Results  Component Value Date  [place] 73 02/11/2022   Lab Results  Component Value Date   TRIG 104 02/11/2022   Lab Results  Component Value Date   CHOLHDL 2.7 02/11/2022   Lab Results  Component Value Date   HGBA1C 6.5 (H) 02/11/2022       Assessment & Plan:   Problem List Items Addressed This Visit   None Visit Diagnoses     Fever, unspecified fever cause    -  Primary   Relevant Orders   POC COVID-19 (Completed)   Influenza A/B (Completed)   Gastroenteritis       Relevant Medications   ciprofloxacin  (CIPRO) 500 MG tablet   Other Relevant Orders   CBC with Differential/Platelet   Cdiff NAA+O+P+Stool Culture   Comprehensive metabolic panel   Diarrhea, unspecified type       Relevant Orders   Cdiff NAA+O+P+Stool Culture   COPD exacerbation ([place])     Take meds as directed      Meds ordered this encounter  Medications   ciprofloxacin (CIPRO) 500 MG tablet    Sig: Take 1 tablet (500 mg total) by mouth 2 (two) times daily for 10 days.    Dispense:  20 tablet    Refill:  0    Order Specific Question:   Supervising Provider    AnswerShelton Silvas    Orders Placed This Encounter  Procedures   Cdiff NAA+O+P+Stool Culture   CBC with Differential/Platelet   Comprehensive metabolic panel   POC [id number]   Influenza A/B     Follow-up: Return for on Friday as  already scheduled.  An After Visit Summary was printed and given to the patient.  Yetta Flock [facility] [phone]

## 2022-02-14 ENCOUNTER — Other Ambulatory Visit: Payer: Self-pay

## 2022-02-14 ENCOUNTER — Ambulatory Visit (INDEPENDENT_AMBULATORY_CARE_PROVIDER_SITE_OTHER): Payer: Medicare Other | Admitting: Adult Health

## 2022-02-14 ENCOUNTER — Encounter: Payer: Self-pay | Admitting: Adult Health

## 2022-02-14 DIAGNOSIS — J9611 Chronic respiratory failure with hypoxia: Secondary | ICD-10-CM

## 2022-02-14 DIAGNOSIS — K529 Noninfective gastroenteritis and colitis, unspecified: Secondary | ICD-10-CM | POA: Diagnosis not present

## 2022-02-14 DIAGNOSIS — R197 Diarrhea, unspecified: Secondary | ICD-10-CM | POA: Insufficient documentation

## 2022-02-14 DIAGNOSIS — J441 Chronic obstructive pulmonary disease with (acute) exacerbation: Secondary | ICD-10-CM | POA: Diagnosis not present

## 2022-02-14 LAB — CBC WITH DIFFERENTIAL/PLATELET
Basophils Absolute: 0 10*3/uL (ref 0.0–0.2)
Basos: 0 %
EOS (ABSOLUTE): 0 10*3/uL (ref 0.0–0.4)
Eos: 0 %
Hematocrit: 35.6 % — ABNORMAL LOW (ref 37.5–51.0)
Hemoglobin: 11.2 g/dL — ABNORMAL LOW (ref 13.0–17.7)
Immature Grans (Abs): 0.2 10*3/uL — ABNORMAL HIGH (ref 0.0–0.1)
Immature Granulocytes: 2 %
Lymphocytes Absolute: 0.3 10*3/uL — ABNORMAL LOW (ref 0.7–3.1)
Lymphs: 2 %
MCH: 25.6 pg — ABNORMAL LOW (ref 26.6–33.0)
MCHC: 31.5 g/dL (ref 31.5–35.7)
MCV: 81 fL (ref 79–97)
Monocytes Absolute: 0.4 10*3/uL (ref 0.1–0.9)
Monocytes: 3 %
Neutrophils Absolute: 12.9 10*3/uL — ABNORMAL HIGH (ref 1.4–7.0)
Neutrophils: 93 %
Platelets: 351 10*3/uL (ref 150–450)
RBC: 4.38 x10E6/uL (ref 4.14–5.80)
RDW: 15.7 % — ABNORMAL HIGH (ref 11.6–15.4)
WBC: 13.9 10*3/uL — ABNORMAL HIGH (ref 3.4–10.8)

## 2022-02-14 LAB — COMPREHENSIVE METABOLIC PANEL
ALT: 40 IU/L (ref 0–44)
AST: 25 IU/L (ref 0–40)
Albumin/Globulin Ratio: 1.8 (ref 1.2–2.2)
Albumin: 3.7 g/dL (ref 3.7–4.7)
Alkaline Phosphatase: 72 IU/L (ref 44–121)
BUN/Creatinine Ratio: 18 (ref 10–24)
BUN: 13 mg/dL (ref 8–27)
Bilirubin Total: 0.2 mg/dL (ref 0.0–1.2)
CO2: 27 mmol/L (ref 20–29)
Calcium: 8.6 mg/dL (ref 8.6–10.2)
Chloride: 97 mmol/L (ref 96–106)
Creatinine, Ser: 0.72 mg/dL — ABNORMAL LOW (ref 0.76–1.27)
Globulin, Total: 2.1 g/dL (ref 1.5–4.5)
Glucose: 116 mg/dL — ABNORMAL HIGH (ref 70–99)
Potassium: 4.6 mmol/L (ref 3.5–5.2)
Sodium: 138 mmol/L (ref 134–144)
Total Protein: 5.8 g/dL — ABNORMAL LOW (ref 6.0–8.5)
eGFR: 95 mL/min/{1.73_m2} (ref >59)

## 2022-02-14 MED ORDER — ALBUTEROL SULFATE (2.5 MG/3ML) 0.083% IN NEBU: 2.5000 mg | INHALATION_SOLUTION | Freq: Four times a day (QID) | RESPIRATORY_TRACT | 5 refills | Status: DC | PRN

## 2022-02-14 NOTE — Assessment & Plan Note (Signed)
Chronic hypoxic respiratory failure.  We will set patient up for ABG next week.  If hypercarbic would consider nocturnal BiPAP support.  Plan  Patient Instructions  Continue on BREZTRI 2 puffs Twice daily, rinse after use.  Finish Cipro.  Add Probiotic daily  Take Prednisone 25mg  daily for 4 days then 20mg  daily for 4 days and 15mg  daily for 4 days , hold at 10mg  daily  Mucinex Twice daily  As needed  Cough/congestion  Flutter valve Three times a day  .  Albuterol Neb or inhaler as needed  Wear Oxygen 2l/m  with activity and At bedtime   to maintain O2 sats >88-90%.  Activity as tolerated.  Set up for ABG next 1-2 weeks.  Aspirations precautions as discussed.  Please contact office for sooner follow up if symptoms do not improve or worsen or seek emergency care  Follow up with Dr. Melvyn Novas  Or Novella Abraha NP in 4-6  weeks and As needed

## 2022-02-14 NOTE — Assessment & Plan Note (Signed)
Unclear etiology.  Continue with office visit recommendations from primary care. Finish up antibiotics as discussed.  Add probiotic.  He has been on several antibiotics recently

## 2022-02-14 NOTE — Assessment & Plan Note (Signed)
Recurrent COPD exacerbation with recent hospitalization.  Patient seems to be improving.  We will have him to slowly taper down prednisone and hold at a 10 mg daily dose.  He has been tried on Daliresp but unable to tolerate. We will check an ABG for hypercarbia as he would benefit from noninvasive vent support at bedtime  Plan  Patient Instructions  Continue on BREZTRI 2 puffs Twice daily, rinse after use.  Finish Cipro.  Add Probiotic daily  Take Prednisone 25mg  daily for 4 days then 20mg  daily for 4 days and 15mg  daily for 4 days , hold at 10mg  daily  Mucinex Twice daily  As needed  Cough/congestion  Flutter valve Three times a day  .  Albuterol Neb or inhaler as needed  Wear Oxygen 2l/m  with activity and At bedtime   to maintain O2 sats >88-90%.  Activity as tolerated.  Set up for ABG next 1-2 weeks.  Aspirations precautions as discussed.  Please contact office for sooner follow up if symptoms do not improve or worsen or seek emergency care  Follow up with Dr. Melvyn Novas  Or Taimur Fier NP in 4-6  weeks and As needed

## 2022-02-14 NOTE — Assessment & Plan Note (Signed)
Appears compensated does not appear to have evidence of volume overload.  Continue on current regimen

## 2022-02-14 NOTE — Progress Notes (Signed)
@Patient  ID: Paul [name], male    DOB: 24-Dec-1945, 76 y.o.   MRN: [id number]  Chief Complaint  Patient presents with   Follow-up    Referring provider: [name], MD  HPI: 76 year old male former smoker quit in 2010 for severe COPD and chronic respiratory failure on oxygen.  He is prone to recurrent COPD exacerbations. Medical history significant for atrial fibrillation COVID-19 infection January 2022 requiring hospitalization  TEST/EVENTS :  05/29/16: FVC 2.08 L (86%) FEV1 0.83 L (31%) FEV1/FVC 0.40 FEF 25-75 0.30 L (14%) no bronchodilator response TLC 6.56 L (107%) RV 167% ERV 160% DLCO corrected 60% (hemoglobin 10.9) 07/15/13: FVC 2.68 L (60%) FEV1 1.63 L (53%) FEV1/FVC 0.61 FEF 25-75 0.76 L (26%) 02/04/12: FVC 2.18 L (57%) FEV1 1.09 L (36%) FEV1/FVC 0.50 FEF 25-75 0.33 L (11%)     CT sinus February 2021 no acute sinus disease noted CT chest February 2021 emphysematous changes, several old thoracic compression fractures   2D echo July 2020 EF 60-65%,   Palliative care started 2021 (nurse comes every 2 weeks)   02/14/2022 Follow up ; COPD, chronic respiratory failure on oxygen, post hospital follow-up. Patient returns for a post hospital follow-up.  Patient was admitted last week with acute COPD exacerbation, positive for rhinovirus and acute on chronic hypoxic respiratory failure he was treated with aggressive IV antibiotics, nebulized bronchodilators and steroid burst.  Patient says prior to admission he did get choked while eating.  And within a couple days his symptoms started with increased cough and shortness of breath.  Since discharge patient says he is feeling better with decreased cough and shortness of breath.  He did have some issues with ongoing stomach upset and loose stools.  He was seen by primary care.  And started on Cipro.  Stool was sent off for C. difficile.  Patient says he is somewhat better.  Denies any bloody stools.  We discussed adding in a probiotic to  his daily regimen. Patient is currently tapering down a prednisone taper.  Currently on 25 mg of prednisone.  He remains on Breztri inhaler. Appetite is improving.  No nausea vomiting or diarrhea. Patient has tried Daliresp in the past but was intolerant. He remains on chronic steroids with a maintenance dose of 5 mg. He remains on oxygen 2 L with activity and at bedtime.  Patient denies any increased oxygen demands.  Allergies  Allergen Reactions   Daliresp [Roflumilast] Diarrhea and Nausea And Vomiting   Levaquin [Levofloxacin] Palpitations    Made the B/P fluctuate and heart raced   Alendronate Anxiety and Other (See Comments)    Jittery/nervous    Immunization History  Administered Date(s) Administered   Fluad Quad(high Dose 65+) 10/02/2020, 10/09/2021   Influenza Split 09/22/2013, 09/01/2015   Influenza Whole 09/04/2011, 09/21/2012   Influenza, High Dose Seasonal PF 09/02/2016, 09/10/2017, 08/31/2018, 09/24/2018, 10/21/2019   Influenza-Unspecified 08/23/2014   Moderna Covid-19 Vaccine Bivalent Booster 66yrs & up 10/09/2021   Moderna SARS-COV2 Booster Vaccination 07/05/2021   [place] Sars-Covid-2 Vaccination 01/17/2020, 02/14/2020, 10/06/2020   Pneumococcal Conjugate-13 01/11/2014, 12/09/2016   Pneumococcal Polysaccharide-23 08/23/2010, 08/29/2011   Zoster Recombinat (Shingrix) 04/04/2014    Past Medical History:  Diagnosis Date   Anemia    Aortic atherosclerosis ([place])    Aspiration pneumonia due to inhalation of vomitus ([place]) 02/03/2022   Atrial fibrillation (HCC)    Back pain    COPD (chronic obstructive pulmonary disease) ([place])    COPD exacerbation ([place]) 12/27/2021   COVID-19 12/24/2020  Diabetes mellitus without complication (HCC)    Elevated PSA    GAD (generalized anxiety disorder)    GERD (gastroesophageal reflux disease)    High cholesterol    Hypertension    Lingular pneumonia 01/05/2021   See cxr 01/06/20 rx with zpak/ cefipime x 5 days and then developed  purulent sputum again 01/12/21 so rx levcaquin 500 mg daily x 7 days then return for cxr before more    Lung nodule < 6cm on CT 05/29/2016   Osteoporosis    Oxygen deficiency    Pneumonia    January 2022   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 Quentin Ore with a 20 mm Watchman FLX device   Vertebral compression fracture ([place]) 05/29/2019   T8 compression fracture noted on CT scan 05/28/2019 inpatient with chronic steroid dependent COPD - rx calcitonin nasal spray rx per PCP     Tobacco History: Social History   Tobacco Use  Smoking Status Former   Packs/day: 2.00   Years: 52.00   Pack years: 104.00   Types: Cigarettes   Quit date: 02/03/2009   Years since quitting: 13.0  Smokeless Tobacco Never  Tobacco Comments   Counseled to remain smoke free   Counseling given: Not Answered Tobacco comments: Counseled to remain smoke free   Outpatient Medications Prior to Visit  Medication Sig Dispense Refill   acetaminophen (TYLENOL) 325 MG tablet Take 650 mg by mouth every 6 (six) hours as needed for mild pain or fever.     albuterol (PROAIR HFA) 108 (90 Base) MCG/ACT inhaler Inhale 2 puffs into the lungs every 6 (six) hours as needed for wheezing or shortness of breath.      albuterol (PROVENTIL) (2.5 MG/3ML) 0.083% nebulizer solution Take 3 mLs (2.5 mg total) by nebulization every 6 (six) hours as needed for shortness of breath or wheezing. 360 mL 5   ALPRAZolam (XANAX) 0.25 MG tablet Take 0.25 mg by mouth daily as needed for anxiety.     Ascorbic Acid (VITAMIN C) 1000 MG tablet Take 1,000 mg by mouth daily.     aspirin EC 81 MG tablet Take 1 tablet (81 mg total) by mouth daily. Swallow whole. 90 tablet 3   atorvastatin (LIPITOR) 40 MG tablet TAKE 1 TABLET BY MOUTH ONCE DAILY AFTER SUPPER 90 tablet 0   bisoprolol (ZEBETA) 5 MG tablet Take 1 tablet (5 mg total) by mouth daily. 90 tablet 3   Budeson-Glycopyrrol-Formoterol (BREZTRI AEROSPHERE) 160-9-4.8  MCG/ACT AERO Inhale 2 puffs into the lungs 2 (two) times daily. 32.1 g 3   calcium carbonate (OS-CAL) 600 MG TABS tablet Take 600 mg by mouth daily.     Cholecalciferol (VITAMIN D) 50 MCG (2000 UT) CAPS Take 2,000 Units by mouth daily.     ciprofloxacin (CIPRO) 500 MG tablet Take 1 tablet (500 mg total) by mouth 2 (two) times daily for 10 days. 20 tablet 0   denosumab (PROLIA) 60 MG/ML SOSY injection Inject 60 mg into the skin every 6 (six) months.     diltiazem (CARDIZEM) 60 MG tablet Take 1 tablet (60 mg total) by mouth 4 (four) times daily as needed. (Patient taking differently: Take 60 mg by mouth 4 (four) times daily as needed (afib).) 30 tablet prn   ferrous sulfate 325 (65 FE) MG tablet Take 325 mg by mouth 2 (two) times daily with a meal.     furosemide (LASIX) 40 MG tablet Take 1 tablet (40 mg total)  by mouth daily. 90 tablet 3   guaiFENesin (MUCINEX) 600 MG 12 hr tablet Take 600 mg by mouth 2 (two) times daily.     loratadine (CLARITIN) 10 MG tablet Take 10 mg by mouth in the morning.     Multiple Vitamin (MULTIVITAMIN WITH MINERALS) TABS tablet Take 1 tablet by mouth daily.     Niacinamide-Zn-Cu-Methfo-Se-Cr (NICOTINAMIDE PO) Take 1 tablet by mouth in the morning and at bedtime.     OXYGEN Inhale 2-2.5 L/min into the lungs See admin instructions. Inhale 2 L/min of oxygen into the lungs at bedtime and when ambulatory     pantoprazole (PROTONIX) 40 MG tablet Take 1 tablet by mouth once daily 90 tablet 3   sertraline (ZOLOFT) 50 MG tablet Take 1 tablet (50 mg total) by mouth daily. 90 tablet 1   tiZANidine (ZANAFLEX) 2 MG tablet Take 1 tablet (2 mg total) by mouth every 6 (six) hours as needed for muscle spasms. 30 tablet 0   No facility-administered medications prior to visit.     Review of Systems:   Constitutional:   No  weight loss, night sweats,  Fevers, chills,  +fatigue, or  lassitude.  HEENT:   No headaches,  Difficulty swallowing,  Tooth/dental problems, or  Sore throat,                 No sneezing, itching, ear ache, nasal congestion, post nasal drip,   CV:  No chest pain,  Orthopnea, PND, swelling in lower extremities, anasarca, dizziness, palpitations, syncope.   GI  No heartburn, indigestion, abdominal pain, nausea, vomiting, diarrhea, change in bowel habits, loss of appetite, bloody stools.   Resp:   No chest wall deformity  Skin: no rash or lesions.  GU: no dysuria, change in color of urine, no urgency or frequency.  No flank pain, no hematuria   MS:  No joint pain or swelling.  No decreased range of motion.  No back pain.    Physical Exam  BP 124/68 (BP Location: Left Arm, Patient Position: Sitting, Cuff Size: Normal)    Pulse 82    Temp 98.2 F (36.8 C) (Oral)    Ht 5\' 6"  (1.676 m)    Wt 147 lb 9.6 oz (67 kg)    SpO2 94%    BMI 23.82 kg/m   GEN: A/Ox3; pleasant , NAD, elderly   HEENT:  /AT,  EACs-clear, TMs-wnl, NOSE-clear, THROAT-clear, no lesions, no postnasal drip or exudate noted.   NECK:  Supple w/ fair ROM; no JVD; normal carotid impulses w/o bruits; no thyromegaly or nodules palpated; no lymphadenopathy.    RESP  Clear  P & A; w/o, wheezes/ rales/ or rhonchi. no accessory muscle use, no dullness to percussion  CARD:  RRR, no m/r/g, no peripheral edema, pulses intact, no cyanosis or clubbing.  GI:   Soft & nt; nml bowel sounds; no organomegaly or masses detected.   Musco: Warm bil, no deformities or joint swelling noted.   Neuro: alert, no focal deficits noted.    Skin: Warm, no lesions or rashes    Lab Results:  CBC     BNP   ProBNP No results found for: PROBNP  Imaging: DG Chest 2 View  Result Date: 02/06/2022 CLINICAL DATA:  COPD.  Recent aspiration pneumonia and COVID. EXAM: CHEST - 2 VIEW COMPARISON:  02/04/2022 FINDINGS: Mid and lower thoracic compression deformities are suboptimally evaluated. Prior vertebral augmentation within a thoracolumbar junction vertebral body. Appearance is similar to 12/28/2021  Numerous  leads and wires project over the chest. Apical lordotic AP frontal radiograph. Midline trachea. Normal heart size. Atherosclerosis in the transverse aorta. No pleural effusion or pneumothorax. Clear lungs. IMPRESSION: No acute cardiopulmonary disease. Electronically Signed   By: [name] Miyamoto M.D.   On: 02/06/2022 14:28   DG Chest Portable 1 View  Result Date: 02/04/2022 CLINICAL DATA:  Cough and fever. EXAM: PORTABLE CHEST 1 VIEW COMPARISON:  12/28/2021 FINDINGS: 1211 hours. The lungs are clear without focal pneumonia, edema, pneumothorax or pleural effusion. Probable tiny granuloma right upper lobe stable since 01/30/2021. The cardiopericardial silhouette is within normal limits for size. The visualized bony structures of the thorax show no acute abnormality. Telemetry leads overlie the chest. IMPRESSION: No active disease. Electronically Signed   By: Misty Stanley M.D.   On: 02/04/2022 12:21      PFT Results Latest Ref Rng & Units 05/29/2016  FVC-Pre L 1.92  FVC-Predicted Pre % 52  Pre FEV1/FVC % % 41  FEV1-Pre L 0.78  FEV1-Predicted Pre % 29    No results found for: NITRICOXIDE      Assessment & Plan:   COPD with acute exacerbation (HCC) Recurrent COPD exacerbation with recent hospitalization.  Patient seems to be improving.  We will have him to slowly taper down prednisone and hold at a 10 mg daily dose.  He has been tried on Daliresp but unable to tolerate. We will check an ABG for hypercarbia as he would benefit from noninvasive vent support at bedtime  Plan  Patient Instructions  Continue on BREZTRI 2 puffs Twice daily, rinse after use.  Finish Cipro.  Add Probiotic daily  Take Prednisone 25mg  daily for 4 days then 20mg  daily for 4 days and 15mg  daily for 4 days , hold at 10mg  daily  Mucinex Twice daily  As needed  Cough/congestion  Flutter valve Three times a day  .  Albuterol Neb or inhaler as needed  Wear Oxygen 2l/m  with activity and At bedtime   to maintain O2  sats >88-90%.  Activity as tolerated.  Set up for ABG next 1-2 weeks.  Aspirations precautions as discussed.  Please contact office for sooner follow up if symptoms do not improve or worsen or seek emergency care  Follow up with Dr. Melvyn Novas  Or Ysenia Filice NP in 4-6  weeks and As needed        Chronic respiratory failure with hypoxia ([place]) Chronic hypoxic respiratory failure.  We will set patient up for ABG next week.  If hypercarbic would consider nocturnal BiPAP support.  Plan  Patient Instructions  Continue on BREZTRI 2 puffs Twice daily, rinse after use.  Finish Cipro.  Add Probiotic daily  Take Prednisone 25mg  daily for 4 days then 20mg  daily for 4 days and 15mg  daily for 4 days , hold at 10mg  daily  Mucinex Twice daily  As needed  Cough/congestion  Flutter valve Three times a day  .  Albuterol Neb or inhaler as needed  Wear Oxygen 2l/m  with activity and At bedtime   to maintain O2 sats >88-90%.  Activity as tolerated.  Set up for ABG next 1-2 weeks.  Aspirations precautions as discussed.  Please contact office for sooner follow up if symptoms do not improve or worsen or seek emergency care  Follow up with Dr. Melvyn Novas  Or Noemy Hallmon NP in 4-6  weeks and As needed       Chronic diastolic CHF (congestive heart failure) ([place]) Appears compensated does not appear to have  evidence of volume overload.  Continue on current regimen  Diarrhea Unclear etiology.  Continue with office visit recommendations from primary care. Finish up antibiotics as discussed.  Add probiotic.  He has been on several antibiotics recently     Rexene Edison, NP 02/14/2022

## 2022-02-14 NOTE — Addendum Note (Signed)
Addended by: Retia Passe on: 02/14/2022 03:30 PM   Modules accepted: Orders

## 2022-02-14 NOTE — Patient Instructions (Addendum)
Continue on BREZTRI 2 puffs Twice daily, rinse after use.  Finish Cipro.  Add Probiotic daily  Take Prednisone 25mg  daily for 4 days then 20mg  daily for 4 days and 15mg  daily for 4 days , hold at 10mg  daily  Mucinex Twice daily  As needed  Cough/congestion  Flutter valve Three times a day  .  Albuterol Neb or inhaler as needed  Wear Oxygen 2l/m  with activity and At bedtime   to maintain O2 sats >88-90%.  Activity as tolerated.  Set up for ABG next 1-2 weeks.  Aspirations precautions as discussed.  Please contact office for sooner follow up if symptoms do not improve or worsen or seek emergency care  Follow up with Dr. Melvyn Novas  Or Rolondo Pierre NP in 4-6  weeks and As needed

## 2022-02-15 ENCOUNTER — Ambulatory Visit (INDEPENDENT_AMBULATORY_CARE_PROVIDER_SITE_OTHER): Payer: Medicare Other | Admitting: Family Medicine

## 2022-02-15 VITALS — BP 124/56 | HR 78 | Temp 97.3°F | Resp 18 | Ht 63.5 in | Wt 147.0 lb

## 2022-02-15 DIAGNOSIS — E782 Mixed hyperlipidemia: Secondary | ICD-10-CM | POA: Diagnosis not present

## 2022-02-15 DIAGNOSIS — E1169 Type 2 diabetes mellitus with other specified complication: Secondary | ICD-10-CM | POA: Diagnosis not present

## 2022-02-15 DIAGNOSIS — I700 Atherosclerosis of aorta: Secondary | ICD-10-CM

## 2022-02-15 DIAGNOSIS — J9611 Chronic respiratory failure with hypoxia: Secondary | ICD-10-CM | POA: Diagnosis not present

## 2022-02-15 DIAGNOSIS — M8000XD Age-related osteoporosis with current pathological fracture, unspecified site, subsequent encounter for fracture with routine healing: Secondary | ICD-10-CM | POA: Diagnosis not present

## 2022-02-15 DIAGNOSIS — I480 Paroxysmal atrial fibrillation: Secondary | ICD-10-CM

## 2022-02-15 DIAGNOSIS — J449 Chronic obstructive pulmonary disease, unspecified: Secondary | ICD-10-CM

## 2022-02-15 DIAGNOSIS — J410 Simple chronic bronchitis: Secondary | ICD-10-CM

## 2022-02-15 DIAGNOSIS — A09 Infectious gastroenteritis and colitis, unspecified: Secondary | ICD-10-CM

## 2022-02-15 DIAGNOSIS — Z0001 Encounter for general adult medical examination with abnormal findings: Secondary | ICD-10-CM

## 2022-02-15 DIAGNOSIS — Z9981 Dependence on supplemental oxygen: Secondary | ICD-10-CM

## 2022-02-15 NOTE — Progress Notes (Signed)
Subjective:  Patient ID: Paul [name], male    DOB: 04-Feb-1946  Age: 76 y.o. MRN: [id number]  Chief Complaint  Patient presents with   Annual Exam    HPI No concerns today. Diarrhea has resolved with cipro. Does not see dentist on a regular basis due to having dentures.  Sees the eye doctor once a year and his glasses do help his site.  At home has smoke detectors and carbon monoxide detectors.  Patient practices appropriate gun safety.  Patient wears a hat when he goes outside in the sun.  Sees Dr. Jimmye Norman, dermatology, on a regular basis.  He has no skin lesions he is concerned about today.   Patient eats healthy. [name], his wife, is his [place], Secondary HCPOA: Brynda [place].  Patient is a 76 yo male with diabetes, hyperlipidemia, hypertension, copd, chronic respiratory failure with hypoxia, and atrial fibrillation. Reviewed labs with patient that he got a few days ago. Patient is on cipro for infectious diarrhea that he was started on 2 days ago. Patient is currently on lipitor 40 mg daily for hyperlipidemia. Patient is on no medicines for diabetes. COPD is managed by pulmonology and is on max inhalers and currently on a prednisone taper. The taper can usually get down to 10 mg daily, but then cannot come off due to copd flaring up. For his hypertension he is on zebeta 10 mg daily and lasix 40 mg daily for edema. Patient has GAD and is currently on zoloft 50 mg daily and xanax 0.25 mg daily as needed.   PHQ9 SCORE ONLY 02/15/2022 01/14/2022 12/11/2021  PHQ-9 Total Score 0 0 0   GAD 7 : Generalized Anxiety Score 02/15/2022  Nervous, Anxious, on Edge 0  Control/stop worrying 1  Worry too much - different things 0  Trouble relaxing 1  Restless 1  Easily annoyed or irritable 0  Afraid - awful might happen 0  Total GAD 7 Score 3  Anxiety Difficulty Not difficult at all    .  Current Outpatient Medications on File Prior to Visit  Medication Sig Dispense Refill   acetaminophen (TYLENOL)  325 MG tablet Take 650 mg by mouth every 6 (six) hours as needed for mild pain or fever.     albuterol (PROAIR HFA) 108 (90 Base) MCG/ACT inhaler Inhale 2 puffs into the lungs every 6 (six) hours as needed for wheezing or shortness of breath.      albuterol (PROVENTIL) (2.5 MG/3ML) 0.083% nebulizer solution Take 3 mLs (2.5 mg total) by nebulization every 6 (six) hours as needed for shortness of breath or wheezing. 360 mL 5   ALPRAZolam (XANAX) 0.25 MG tablet Take 0.25 mg by mouth daily as needed for anxiety.     Ascorbic Acid (VITAMIN C) 1000 MG tablet Take 1,000 mg by mouth daily.     aspirin EC 81 MG tablet Take 1 tablet (81 mg total) by mouth daily. Swallow whole. 90 tablet 3   atorvastatin (LIPITOR) 40 MG tablet TAKE 1 TABLET BY MOUTH ONCE DAILY AFTER SUPPER 90 tablet 0   bisoprolol (ZEBETA) 5 MG tablet Take 1 tablet (5 mg total) by mouth daily. 90 tablet 3   Budeson-Glycopyrrol-Formoterol (BREZTRI AEROSPHERE) 160-9-4.8 MCG/ACT AERO Inhale 2 puffs into the lungs 2 (two) times daily. 32.1 g 3   calcium carbonate (OS-CAL) 600 MG TABS tablet Take 600 mg by mouth daily.     Cholecalciferol (VITAMIN D) 50 MCG (2000 UT) CAPS Take 2,000 Units by mouth daily.  denosumab (PROLIA) 60 MG/ML SOSY injection Inject 60 mg into the skin every 6 (six) months.     diltiazem (CARDIZEM) 60 MG tablet Take 1 tablet (60 mg total) by mouth 4 (four) times daily as needed. (Patient taking differently: Take 60 mg by mouth 4 (four) times daily as needed (afib).) 30 tablet prn   ferrous sulfate 325 (65 FE) MG tablet Take 325 mg by mouth 2 (two) times daily with a meal.     furosemide (LASIX) 40 MG tablet Take 1 tablet (40 mg total) by mouth daily. 90 tablet 3   guaiFENesin (MUCINEX) 600 MG 12 hr tablet Take 600 mg by mouth 2 (two) times daily.     loratadine (CLARITIN) 10 MG tablet Take 10 mg by mouth in the morning.     Multiple Vitamin (MULTIVITAMIN WITH MINERALS) TABS tablet Take 1 tablet by mouth daily.      Niacinamide-Zn-Cu-Methfo-Se-Cr (NICOTINAMIDE PO) Take 1 tablet by mouth in the morning and at bedtime.     OXYGEN Inhale 2-2.5 L/min into the lungs See admin instructions. Inhale 2 L/min of oxygen into the lungs at bedtime and when ambulatory     pantoprazole (PROTONIX) 40 MG tablet Take 1 tablet by mouth once daily 90 tablet 3   sertraline (ZOLOFT) 50 MG tablet Take 1 tablet (50 mg total) by mouth daily. 90 tablet 1   tiZANidine (ZANAFLEX) 2 MG tablet Take 1 tablet (2 mg total) by mouth every 6 (six) hours as needed for muscle spasms. 30 tablet 0   No current facility-administered medications on file prior to visit.   Past Medical History:  Diagnosis Date   Anemia    Aortic atherosclerosis (HCC)    Aspiration pneumonia due to inhalation of vomitus ([place]) 02/03/2022   Atrial fibrillation (HCC)    Back pain    COPD (chronic obstructive pulmonary disease) (HCC)    COPD exacerbation ([place]) 12/27/2021   COVID-19 12/24/2020   Diabetes mellitus without complication (HCC)    Elevated PSA    GAD (generalized anxiety disorder)    GERD (gastroesophageal reflux disease)    High cholesterol    Hypertension    Lingular pneumonia 01/05/2021   See cxr 01/06/20 rx with zpak/ cefipime x 5 days and then developed purulent sputum again 01/12/21 so rx levcaquin 500 mg daily x 7 days then return for cxr before more    Lung nodule < 6cm on CT 05/29/2016   Osteoporosis    Oxygen deficiency    Pneumonia    January 2022   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 Quentin Ore with a 20 mm Watchman FLX device   RSV bronchitis 01/27/2022   Vertebral compression fracture ([place]) 05/29/2019   T8 compression fracture noted on CT scan 05/28/2019 inpatient with chronic steroid dependent COPD - rx calcitonin nasal spray rx per PCP    Past Surgical History:  Procedure Laterality Date   CATARACT EXTRACTION Right    IR KYPHO EA ADDL LEVEL THORACIC OR LUMBAR  11/28/2021   LEFT ATRIAL APPENDAGE  OCCLUSION N/A 08/02/2021   Procedure: LEFT ATRIAL APPENDAGE OCCLUSION;  Surgeon: Sherren Mocha, MD;  Location: [facility];  Service: Cardiovascular;  Laterality: N/A;   SKIN SURGERY     shoulders and chest   TEE WITHOUT CARDIOVERSION N/A 08/02/2021   Procedure: TRANSESOPHAGEAL ECHOCARDIOGRAM (TEE);  Surgeon: Sherren Mocha, MD;  Location: [facility];  Service: Cardiovascular;  Laterality: N/A;   TEE  WITHOUT CARDIOVERSION N/A 09/13/2021   Procedure: TRANSESOPHAGEAL ECHOCARDIOGRAM (TEE);  Surgeon: Sanda Klein, MD;  Location: [facility];  Service: Cardiovascular;  Laterality: N/A;    Family History  Problem Relation Age of Onset   Heart disease Brother    Heart disease Mother    Heart disease Father    Cancer Paternal Uncle    Social History   Socioeconomic History   Marital status: Married    Spouse name: Not on file   Number of children: 3   Years of education: Not on file   Highest education level: Not on file  Occupational History   Occupation: retired    Comment: truck driver  Tobacco Use   Smoking status: Former    Packs/day: 2.00    Years: 52.00    Pack years: 104.00    Types: Cigarettes    Quit date: 02/03/2009    Years since quitting: 13.0   Smokeless tobacco: Never   Tobacco comments:    Counseled to remain smoke free  Vaping Use   Vaping Use: Never used  Substance and Sexual Activity   Alcohol use: Not Currently    Alcohol/week: 0.0 standard drinks    Comment: occ   Drug use: No   Sexual activity: Not on file  Other Topics Concern   Not on file  Social History Narrative   Originally from [place]. Previously worked driving a Restaurant manager, fast food. No pets currently. No mold exposure. 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       wears sunscreen, brushes and flosses daily, see's dentist bi-annually, has smoke/carbon monoxide detectors, wears a seatbelt and practices gun safety       Social Determinants of Health   Financial Resource Strain: Low Risk    Difficulty of Paying Living Expenses: Not hard at all  Food Insecurity: No Food Insecurity   Worried About Charity fundraiser in the Last Year: Never true   [place] in the Last Year: Never true  Transportation Needs: No Transportation Needs   Lack of Transportation (Medical): No   Lack of Transportation (Non-Medical): No  Physical Activity: Insufficiently Active   Days of Exercise per Week: 7 days   Minutes of Exercise per Session: 10 min  Stress: No Stress Concern Present   Feeling of Stress : Only a little  Social Connections: Engineer, building services of Communication with Friends and Family: More than three times a week   Frequency of Social Gatherings with Friends and Family: More than three times a week   Attends Religious Services: More than 4 times per year   Active Member of [employer] or Organizations: Yes   Attends Music therapist: More than 4 times per year   Marital Status: Married    Review of Systems  Constitutional:  Positive for fatigue. Negative for chills and fever.  HENT:  Negative for congestion, rhinorrhea and sore throat.   Respiratory:  Positive for cough and shortness of breath.   Cardiovascular:  Negative for chest pain and palpitations.  Gastrointestinal:  Positive for diarrhea and nausea. Negative for abdominal pain, constipation and vomiting.  Genitourinary:  Negative for dysuria and urgency.  Musculoskeletal:  Positive for back pain. Negative for arthralgias and myalgias.  Neurological:  Negative for dizziness and headaches.  Psychiatric/Behavioral:  Negative for dysphoric mood. The patient is not nervous/anxious.     Objective:  BP (!) 124/56  Pulse 78    Temp (!) 97.3 F (36.3 C)    Resp 18    Ht 5' 3.5" (1.613 m)    Wt 147 lb (66.7 kg)    BMI 25.63 kg/m   BP/Weight 02/15/2022 02/14/2022 3/61/4431  Systolic BP 540 086 761  Diastolic BP 56 68 60   Wt. (Lbs) 147 147.6 148.8  BMI 25.63 23.82 25.54    Physical Exam Vitals reviewed.  Constitutional:      Appearance: Normal appearance.  HENT:     Right Ear: Tympanic membrane, ear canal and external ear normal.     Left Ear: Tympanic membrane, ear canal and external ear normal.     Nose: Nose normal. No congestion or rhinorrhea.     Mouth/Throat:     Mouth: Mucous membranes are moist.     Pharynx: No oropharyngeal exudate or posterior oropharyngeal erythema.  Eyes:     Conjunctiva/sclera: Conjunctivae normal.  Neck:     Vascular: No carotid bruit.  Cardiovascular:     Rate and Rhythm: Normal rate and regular rhythm.     Pulses: Normal pulses.     Heart sounds: Normal heart sounds.  Pulmonary:     Effort: Pulmonary effort is normal. No respiratory distress.     Breath sounds: Normal breath sounds. No wheezing, rhonchi or rales.     Comments: ON oxygen 2 L. Abdominal:     General: Bowel sounds are normal.     Palpations: Abdomen is soft.     Tenderness: There is no abdominal tenderness.  Neurological:     Mental Status: He is alert and oriented to person, place, and time.  Psychiatric:        Mood and Affect: Mood normal.        Behavior: Behavior normal.    Diabetic Foot Exam - Simple   No data filed      Lab Results  Component Value Date   WBC 13.9 (H) 02/13/2022   HGB 11.2 (L) 02/13/2022   HCT 35.6 (L) 02/13/2022   PLT 351 02/13/2022   GLUCOSE 116 (H) 02/13/2022   CHOL 146 02/11/2022   TRIG 104 02/11/2022   HDL 54 02/11/2022   LDLCALC 73 02/11/2022   ALT 40 02/13/2022   AST 25 02/13/2022   NA 138 02/13/2022   K 4.6 02/13/2022   CL 97 02/13/2022   CREATININE 0.72 (L) 02/13/2022   BUN 13 02/13/2022   CO2 27 02/13/2022   TSH 2.539 03/22/2019   INR 1.4 (H) 12/24/2020   HGBA1C 6.5 (H) 02/11/2022   MICROALBUR Negative 10/09/2021      Assessment & Plan:   Problem List Items Addressed This Visit       Cardiovascular and Mediastinum   Paroxysmal  atrial fibrillation (HCC) (Chronic)    Watchman procedure completed. Off blood thinners.       Atherosclerosis of aorta (HCC)    On lipitor 40 mg before bed.  On aspirin 81 mg once daily.         Respiratory   COPD GOLD  III  (Chronic)    Management per specialist. Pulmonology.  The current medical regimen is at max;  continue present plan and medications. On prednisone taper.       Chronic respiratory failure with hypoxia (HCC)    On 2 L oxygen.      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 Recommend check feet daily. Recommend annual eye exams. Medicines: none. Continue to work on eating a healthy diet and exercise.  Labs drawn today.         Relevant Orders   Microalbumin / creatinine urine ratio     Musculoskeletal and Integument   Age-related osteoporosis with healing pathological fracture, chronic back pain    Continue prolia every 6 months.         Other   Oxygen dependent (Chronic)    2 L oxygen      Encounter for general adult medical examination with abnormal findings - Primary    Things to do to keep yourself healthy  - Eat a low-fat diet with lots of fruits and vegetables, up to 7-9 servings per day.  - Seatbelts can save your life. Wear them always.  - Smoke detectors on every level of your home, check batteries every year.  - Eye Doctor - have an eye exam every 1-2 years  - [place]. Requested patient bring HCPOA back. - Depression is common in our stressful world.If you're feeling down or losing interest in things you normally enjoy, please come in for a visit.        Diarrhea    Stool studies normal except O and P pending.  Complete cipro.  Improved.      .  Orders Placed This Encounter  Procedures   Microalbumin / creatinine urine ratio     Follow-up: Return  in about 3 months (around 05/15/2022) for chronic fasting.  An After Visit Summary was printed and given to the patient.  [name], MD [facility] [phone]

## 2022-02-19 DIAGNOSIS — J449 Chronic obstructive pulmonary disease, unspecified: Secondary | ICD-10-CM | POA: Diagnosis not present

## 2022-02-19 DIAGNOSIS — E119 Type 2 diabetes mellitus without complications: Secondary | ICD-10-CM | POA: Diagnosis not present

## 2022-02-19 DIAGNOSIS — I480 Paroxysmal atrial fibrillation: Secondary | ICD-10-CM | POA: Diagnosis not present

## 2022-02-19 DIAGNOSIS — I10 Essential (primary) hypertension: Secondary | ICD-10-CM | POA: Diagnosis not present

## 2022-02-21 LAB — CDIFF NAA+O+P+STOOL CULTURE
E coli, Shiga toxin Assay: NEGATIVE
Toxigenic C. Difficile by PCR: NEGATIVE

## 2022-02-24 ENCOUNTER — Encounter: Payer: Self-pay | Admitting: Family Medicine

## 2022-02-24 NOTE — Assessment & Plan Note (Signed)
Control: good Recommend check sugars fasting daily. Recommend check feet daily. Recommend annual eye exams. Medicines: none Continue to work on eating a healthy diet and exercise.  Labs drawn today.    

## 2022-02-24 NOTE — Assessment & Plan Note (Signed)
On lipitor 40 mg before bed.  ?On aspirin 81 mg once daily.  ?

## 2022-02-24 NOTE — Assessment & Plan Note (Signed)
Stool studies normal except O and P pending.  ?Complete cipro.  ?Improved.  ?

## 2022-02-24 NOTE — Assessment & Plan Note (Signed)
2 L oxygen ?

## 2022-02-24 NOTE — Assessment & Plan Note (Signed)
Secondary to eliquis which was required for atrial fibrillation. ?

## 2022-02-24 NOTE — Assessment & Plan Note (Signed)
Watchman procedure completed. Off blood thinners.  ?

## 2022-02-24 NOTE — Assessment & Plan Note (Signed)
Management per specialist. Pulmonology.  ?The current medical regimen is at max;  continue present plan and medications. ?On prednisone taper.  ?

## 2022-02-24 NOTE — Assessment & Plan Note (Signed)
On 2 L oxygen. ?

## 2022-02-24 NOTE — Assessment & Plan Note (Signed)
Continue prolia every 6 months.  

## 2022-02-24 NOTE — Assessment & Plan Note (Signed)
Things to do to keep yourself healthy  ?- Eat a low-fat diet with lots of fruits and vegetables, up to 7-9 servings per day.  ?- Seatbelts can save your life. Wear them always.  ?- Smoke detectors on every level of your home, check batteries every year.  ?- Eye Doctor - have an eye exam every 1-2 years  ?- [place]. Requested patient bring HCPOA back. ?- Depression is common in our stressful world.If you're feeling down or losing interest in things you normally enjoy, please come in for a visit.  ? ?

## 2022-02-26 ENCOUNTER — Telehealth: Payer: Self-pay | Admitting: Adult Health

## 2022-02-26 DIAGNOSIS — J449 Chronic obstructive pulmonary disease, unspecified: Secondary | ICD-10-CM

## 2022-02-26 DIAGNOSIS — J9611 Chronic respiratory failure with hypoxia: Secondary | ICD-10-CM

## 2022-02-26 NOTE — Telephone Encounter (Signed)
Called and spoke with patient's wife. She was following on the ABG that TP had mentioned during the last OV. Patient is in the process of trying to get a bipap.  ? ?I apologized to her since the order was not placed during the [place]. She is aware that I will place the order today.  ? ?Order has been placed. Nothing further needed at time of call.  ?

## 2022-02-28 DIAGNOSIS — R051 Acute cough: Secondary | ICD-10-CM | POA: Diagnosis not present

## 2022-02-28 DIAGNOSIS — R059 Cough, unspecified: Secondary | ICD-10-CM | POA: Diagnosis not present

## 2022-02-28 DIAGNOSIS — Z20822 Contact with and (suspected) exposure to covid-19: Secondary | ICD-10-CM | POA: Diagnosis not present

## 2022-03-01 ENCOUNTER — Other Ambulatory Visit: Payer: Self-pay

## 2022-03-01 MED ORDER — FUROSEMIDE 40 MG PO TABS: 40.0000 mg | ORAL_TABLET | Freq: Every day | ORAL | 3 refills | Status: DC

## 2022-03-01 NOTE — Telephone Encounter (Signed)
Pt's medication was sent to pt's pharmacy as requested. Confirmation received.  °

## 2022-03-02 ENCOUNTER — Other Ambulatory Visit: Payer: Self-pay

## 2022-03-02 ENCOUNTER — Encounter (HOSPITAL_BASED_OUTPATIENT_CLINIC_OR_DEPARTMENT_OTHER): Payer: Self-pay | Admitting: Emergency Medicine

## 2022-03-02 ENCOUNTER — Emergency Department (HOSPITAL_BASED_OUTPATIENT_CLINIC_OR_DEPARTMENT_OTHER): Payer: Medicare Other

## 2022-03-02 ENCOUNTER — Inpatient Hospital Stay (HOSPITAL_BASED_OUTPATIENT_CLINIC_OR_DEPARTMENT_OTHER)
Admission: EM | Admit: 2022-03-02 | Discharge: 2022-03-04 | DRG: 190 | Disposition: A | Discharge status: Home / Self Care | Payer: Medicare Other | Attending: Internal Medicine | Admitting: Internal Medicine

## 2022-03-02 DIAGNOSIS — D509 Iron deficiency anemia, unspecified: Secondary | ICD-10-CM | POA: Diagnosis present

## 2022-03-02 DIAGNOSIS — J9621 Acute and chronic respiratory failure with hypoxia: Secondary | ICD-10-CM | POA: Diagnosis present

## 2022-03-02 DIAGNOSIS — E782 Mixed hyperlipidemia: Secondary | ICD-10-CM | POA: Diagnosis present

## 2022-03-02 DIAGNOSIS — E119 Type 2 diabetes mellitus without complications: Secondary | ICD-10-CM | POA: Diagnosis present

## 2022-03-02 DIAGNOSIS — K219 Gastro-esophageal reflux disease without esophagitis: Secondary | ICD-10-CM | POA: Diagnosis present

## 2022-03-02 DIAGNOSIS — E7800 Pure hypercholesterolemia, unspecified: Secondary | ICD-10-CM | POA: Diagnosis present

## 2022-03-02 DIAGNOSIS — E1169 Type 2 diabetes mellitus with other specified complication: Secondary | ICD-10-CM

## 2022-03-02 DIAGNOSIS — E785 Hyperlipidemia, unspecified: Secondary | ICD-10-CM | POA: Diagnosis present

## 2022-03-02 DIAGNOSIS — Z888 Allergy status to other drugs, medicaments and biological substances status: Secondary | ICD-10-CM

## 2022-03-02 DIAGNOSIS — D649 Anemia, unspecified: Secondary | ICD-10-CM | POA: Diagnosis present

## 2022-03-02 DIAGNOSIS — Z8701 Personal history of pneumonia (recurrent): Secondary | ICD-10-CM

## 2022-03-02 DIAGNOSIS — J9611 Chronic respiratory failure with hypoxia: Secondary | ICD-10-CM | POA: Diagnosis present

## 2022-03-02 DIAGNOSIS — Z7982 Long term (current) use of aspirin: Secondary | ICD-10-CM | POA: Diagnosis not present

## 2022-03-02 DIAGNOSIS — M810 Age-related osteoporosis without current pathological fracture: Secondary | ICD-10-CM | POA: Diagnosis present

## 2022-03-02 DIAGNOSIS — J441 Chronic obstructive pulmonary disease with (acute) exacerbation: Principal | ICD-10-CM | POA: Diagnosis present

## 2022-03-02 DIAGNOSIS — J9602 Acute respiratory failure with hypercapnia: Secondary | ICD-10-CM

## 2022-03-02 DIAGNOSIS — I152 Hypertension secondary to endocrine disorders: Secondary | ICD-10-CM | POA: Diagnosis present

## 2022-03-02 DIAGNOSIS — I480 Paroxysmal atrial fibrillation: Secondary | ICD-10-CM | POA: Diagnosis present

## 2022-03-02 DIAGNOSIS — E1159 Type 2 diabetes mellitus with other circulatory complications: Secondary | ICD-10-CM | POA: Diagnosis present

## 2022-03-02 DIAGNOSIS — Z8616 Personal history of COVID-19: Secondary | ICD-10-CM

## 2022-03-02 DIAGNOSIS — Z20822 Contact with and (suspected) exposure to covid-19: Secondary | ICD-10-CM | POA: Diagnosis not present

## 2022-03-02 DIAGNOSIS — I5032 Chronic diastolic (congestive) heart failure: Secondary | ICD-10-CM | POA: Diagnosis not present

## 2022-03-02 DIAGNOSIS — Z8249 Family history of ischemic heart disease and other diseases of the circulatory system: Secondary | ICD-10-CM

## 2022-03-02 DIAGNOSIS — Z9841 Cataract extraction status, right eye: Secondary | ICD-10-CM

## 2022-03-02 DIAGNOSIS — I110 Hypertensive heart disease with heart failure: Secondary | ICD-10-CM | POA: Diagnosis present

## 2022-03-02 DIAGNOSIS — F411 Generalized anxiety disorder: Secondary | ICD-10-CM | POA: Diagnosis present

## 2022-03-02 DIAGNOSIS — I700 Atherosclerosis of aorta: Secondary | ICD-10-CM | POA: Diagnosis present

## 2022-03-02 DIAGNOSIS — I10 Essential (primary) hypertension: Secondary | ICD-10-CM | POA: Diagnosis present

## 2022-03-02 DIAGNOSIS — Z95818 Presence of other cardiac implants and grafts: Secondary | ICD-10-CM

## 2022-03-02 DIAGNOSIS — Z87891 Personal history of nicotine dependence: Secondary | ICD-10-CM | POA: Diagnosis not present

## 2022-03-02 DIAGNOSIS — Z79899 Other long term (current) drug therapy: Secondary | ICD-10-CM | POA: Diagnosis not present

## 2022-03-02 DIAGNOSIS — I2510 Atherosclerotic heart disease of native coronary artery without angina pectoris: Secondary | ICD-10-CM | POA: Diagnosis present

## 2022-03-02 DIAGNOSIS — Z9981 Dependence on supplemental oxygen: Secondary | ICD-10-CM

## 2022-03-02 DIAGNOSIS — R059 Cough, unspecified: Secondary | ICD-10-CM | POA: Diagnosis not present

## 2022-03-02 DIAGNOSIS — J449 Chronic obstructive pulmonary disease, unspecified: Secondary | ICD-10-CM | POA: Diagnosis not present

## 2022-03-02 DIAGNOSIS — I2584 Coronary atherosclerosis due to calcified coronary lesion: Secondary | ICD-10-CM | POA: Diagnosis present

## 2022-03-02 DIAGNOSIS — R0602 Shortness of breath: Secondary | ICD-10-CM | POA: Diagnosis not present

## 2022-03-02 LAB — CBC WITH DIFFERENTIAL/PLATELET
Abs Immature Granulocytes: 0.05 10*3/uL (ref 0.00–0.07)
Basophils Absolute: 0.1 10*3/uL (ref 0.0–0.1)
Basophils Relative: 0 %
Eosinophils Absolute: 0.2 10*3/uL (ref 0.0–0.5)
Eosinophils Relative: 1 %
HCT: 39.6 % (ref 39.0–52.0)
Hemoglobin: 11.9 g/dL — ABNORMAL LOW (ref 13.0–17.0)
Immature Granulocytes: 0 %
Lymphocytes Relative: 8 %
Lymphs Abs: 1.2 10*3/uL (ref 0.7–4.0)
MCH: 25.5 pg — ABNORMAL LOW (ref 26.0–34.0)
MCHC: 30.1 g/dL (ref 30.0–36.0)
MCV: 84.8 fL (ref 80.0–100.0)
Monocytes Absolute: 1.1 10*3/uL — ABNORMAL HIGH (ref 0.1–1.0)
Monocytes Relative: 7 %
Neutro Abs: 12.9 10*3/uL — ABNORMAL HIGH (ref 1.7–7.7)
Neutrophils Relative %: 84 %
Platelets: 259 10*3/uL (ref 150–400)
RBC: 4.67 MIL/uL (ref 4.22–5.81)
RDW: 16.9 % — ABNORMAL HIGH (ref 11.5–15.5)
WBC: 15.3 10*3/uL — ABNORMAL HIGH (ref 4.0–10.5)
nRBC: 0 % (ref 0.0–0.2)

## 2022-03-02 LAB — RESP PANEL BY RT-PCR (FLU A&B, COVID) ARPGX2
Influenza A by PCR: NEGATIVE
Influenza B by PCR: NEGATIVE
SARS Coronavirus 2 by RT PCR: NEGATIVE

## 2022-03-02 LAB — BASIC METABOLIC PANEL
Anion gap: 10 (ref 5–15)
BUN: 15 mg/dL (ref 8–23)
CO2: 33 mmol/L — ABNORMAL HIGH (ref 22–32)
Calcium: 9.7 mg/dL (ref 8.9–10.3)
Chloride: 97 mmol/L — ABNORMAL LOW (ref 98–111)
Creatinine, Ser: 0.76 mg/dL (ref 0.61–1.24)
GFR, Estimated: >60 mL/min (ref >60)
Glucose, Bld: 122 mg/dL — ABNORMAL HIGH (ref 70–99)
Potassium: 4 mmol/L (ref 3.5–5.1)
Sodium: 140 mmol/L (ref 135–145)

## 2022-03-02 LAB — GLUCOSE, CAPILLARY
Glucose-Capillary: 167 mg/dL — ABNORMAL HIGH (ref 70–99)
Glucose-Capillary: 192 mg/dL — ABNORMAL HIGH (ref 70–99)

## 2022-03-02 LAB — BRAIN NATRIURETIC PEPTIDE: B Natriuretic Peptide: 107.5 pg/mL — ABNORMAL HIGH (ref 0.0–100.0)

## 2022-03-02 MED ORDER — ASPIRIN EC 81 MG PO TBEC
81.0000 mg | DELAYED_RELEASE_TABLET | Freq: Every day | ORAL | Status: DC
  Administered 2022-03-03 – 2022-03-04 (×2): 81 mg via ORAL
  Filled 2022-03-02 (×2): qty 1

## 2022-03-02 MED ORDER — DILTIAZEM HCL 60 MG PO TABS: 60.0000 mg | ORAL_TABLET | Freq: Four times a day (QID) | ORAL | Status: DC | PRN

## 2022-03-02 MED ORDER — IPRATROPIUM-ALBUTEROL 0.5-2.5 (3) MG/3ML IN SOLN
3.0000 mL | Freq: Once | RESPIRATORY_TRACT | Status: AC
  Administered 2022-03-02: 3 mL via RESPIRATORY_TRACT
  Filled 2022-03-02: qty 3

## 2022-03-02 MED ORDER — FUROSEMIDE 40 MG PO TABS: 40.0000 mg | ORAL_TABLET | Freq: Every day | ORAL | Status: DC

## 2022-03-02 MED ORDER — VITAMIN D 50 MCG (2000 UT) PO CAPS: 2000.0000 [IU] | ORAL_CAPSULE | Freq: Every day | ORAL | Status: DC

## 2022-03-02 MED ORDER — ALBUTEROL SULFATE (2.5 MG/3ML) 0.083% IN NEBU: 2.5000 mg | INHALATION_SOLUTION | Freq: Four times a day (QID) | RESPIRATORY_TRACT | Status: DC | PRN

## 2022-03-02 MED ORDER — ATORVASTATIN CALCIUM 40 MG PO TABS
40.0000 mg | ORAL_TABLET | Freq: Every day | ORAL | Status: DC
  Administered 2022-03-02 – 2022-03-03 (×2): 40 mg via ORAL
  Filled 2022-03-02 (×2): qty 1

## 2022-03-02 MED ORDER — ENOXAPARIN SODIUM 40 MG/0.4ML IJ SOSY
40.0000 mg | PREFILLED_SYRINGE | INTRAMUSCULAR | Status: DC
  Administered 2022-03-02 – 2022-03-03 (×2): 40 mg via SUBCUTANEOUS
  Filled 2022-03-02 (×2): qty 0.4

## 2022-03-02 MED ORDER — UMECLIDINIUM BROMIDE 62.5 MCG/ACT IN AEPB
1.0000 | INHALATION_SPRAY | Freq: Every day | RESPIRATORY_TRACT | Status: DC
  Administered 2022-03-02 – 2022-03-04 (×3): 1 via RESPIRATORY_TRACT
  Filled 2022-03-02: qty 7

## 2022-03-02 MED ORDER — INSULIN ASPART 100 UNIT/ML IJ SOLN
0.0000 [IU] | Freq: Three times a day (TID) | INTRAMUSCULAR | Status: DC
  Administered 2022-03-02 – 2022-03-03 (×2): 3 [IU] via SUBCUTANEOUS

## 2022-03-02 MED ORDER — ONDANSETRON HCL 4 MG/2ML IJ SOLN: 4.0000 mg | Freq: Four times a day (QID) | INTRAMUSCULAR | Status: DC | PRN

## 2022-03-02 MED ORDER — ALBUTEROL SULFATE (2.5 MG/3ML) 0.083% IN NEBU
2.5000 mg | INHALATION_SOLUTION | Freq: Four times a day (QID) | RESPIRATORY_TRACT | Status: DC
  Administered 2022-03-02 – 2022-03-04 (×7): 2.5 mg via RESPIRATORY_TRACT
  Filled 2022-03-02 (×7): qty 3

## 2022-03-02 MED ORDER — DM-GUAIFENESIN ER 30-600 MG PO TB12
1.0000 | ORAL_TABLET | Freq: Two times a day (BID) | ORAL | Status: DC
  Administered 2022-03-02 – 2022-03-04 (×4): 1 via ORAL
  Filled 2022-03-02 (×4): qty 1

## 2022-03-02 MED ORDER — FERROUS SULFATE 325 (65 FE) MG PO TABS
325.0000 mg | ORAL_TABLET | Freq: Two times a day (BID) | ORAL | Status: DC
  Administered 2022-03-02 – 2022-03-04 (×4): 325 mg via ORAL
  Filled 2022-03-02 (×4): qty 1

## 2022-03-02 MED ORDER — ASCORBIC ACID 500 MG PO TABS
1000.0000 mg | ORAL_TABLET | Freq: Every day | ORAL | Status: DC
  Administered 2022-03-03 – 2022-03-04 (×2): 1000 mg via ORAL
  Filled 2022-03-02 (×2): qty 2

## 2022-03-02 MED ORDER — ALPRAZOLAM 0.25 MG PO TABS: 0.2500 mg | ORAL_TABLET | Freq: Every day | ORAL | Status: DC | PRN

## 2022-03-02 MED ORDER — MOMETASONE FURO-FORMOTEROL FUM 100-5 MCG/ACT IN AERO
2.0000 | INHALATION_SPRAY | Freq: Two times a day (BID) | RESPIRATORY_TRACT | Status: DC
  Administered 2022-03-02 – 2022-03-04 (×4): 2 via RESPIRATORY_TRACT
  Filled 2022-03-02: qty 8.8

## 2022-03-02 MED ORDER — PREDNISONE 20 MG PO TABS
40.0000 mg | ORAL_TABLET | Freq: Every day | ORAL | Status: DC
  Administered 2022-03-03 – 2022-03-04 (×2): 40 mg via ORAL
  Filled 2022-03-02 (×2): qty 2

## 2022-03-02 MED ORDER — SERTRALINE HCL 50 MG PO TABS
50.0000 mg | ORAL_TABLET | Freq: Every day | ORAL | Status: DC
  Administered 2022-03-02 – 2022-03-03 (×2): 50 mg via ORAL
  Filled 2022-03-02 (×2): qty 1

## 2022-03-02 MED ORDER — ALBUTEROL SULFATE (2.5 MG/3ML) 0.083% IN NEBU
2.5000 mg | INHALATION_SOLUTION | RESPIRATORY_TRACT | Status: DC | PRN
  Administered 2022-03-02: 2.5 mg via RESPIRATORY_TRACT
  Filled 2022-03-02: qty 3

## 2022-03-02 MED ORDER — ALBUTEROL SULFATE (2.5 MG/3ML) 0.083% IN NEBU: 2.5000 mg | INHALATION_SOLUTION | Freq: Four times a day (QID) | RESPIRATORY_TRACT | Status: DC

## 2022-03-02 MED ORDER — MAGNESIUM SULFATE 2 GM/50ML IV SOLN
2.0000 g | Freq: Once | INTRAVENOUS | Status: AC
  Administered 2022-03-02: 2 g via INTRAVENOUS
  Filled 2022-03-02: qty 50

## 2022-03-02 MED ORDER — IPRATROPIUM-ALBUTEROL 0.5-2.5 (3) MG/3ML IN SOLN: 3.0000 mL | Freq: Four times a day (QID) | RESPIRATORY_TRACT | Status: DC

## 2022-03-02 MED ORDER — BENZONATATE 100 MG PO CAPS
100.0000 mg | ORAL_CAPSULE | Freq: Once | ORAL | Status: AC
  Administered 2022-03-02: 100 mg via ORAL
  Filled 2022-03-02: qty 1

## 2022-03-02 MED ORDER — ACETAMINOPHEN 325 MG PO TABS: 650.0000 mg | ORAL_TABLET | Freq: Four times a day (QID) | ORAL | Status: DC | PRN

## 2022-03-02 MED ORDER — ACETAMINOPHEN 650 MG RE SUPP: 650.0000 mg | Freq: Four times a day (QID) | RECTAL | Status: DC | PRN

## 2022-03-02 MED ORDER — BISOPROLOL FUMARATE 5 MG PO TABS
5.0000 mg | ORAL_TABLET | Freq: Every day | ORAL | Status: DC
  Administered 2022-03-03 – 2022-03-04 (×2): 5 mg via ORAL
  Filled 2022-03-02 (×2): qty 1

## 2022-03-02 MED ORDER — ONDANSETRON HCL 4 MG PO TABS: 4.0000 mg | ORAL_TABLET | Freq: Four times a day (QID) | ORAL | Status: DC | PRN

## 2022-03-02 MED ORDER — LORATADINE 10 MG PO TABS
10.0000 mg | ORAL_TABLET | Freq: Every morning | ORAL | Status: DC
  Administered 2022-03-03 – 2022-03-04 (×2): 10 mg via ORAL
  Filled 2022-03-02 (×2): qty 1

## 2022-03-02 MED ORDER — BUDESON-GLYCOPYRROL-FORMOTEROL 160-9-4.8 MCG/ACT IN AERO: 2.0000 | INHALATION_SPRAY | Freq: Two times a day (BID) | RESPIRATORY_TRACT | Status: DC

## 2022-03-02 MED ORDER — PANTOPRAZOLE SODIUM 40 MG PO TBEC: 40.0000 mg | DELAYED_RELEASE_TABLET | Freq: Every day | ORAL | Status: DC

## 2022-03-02 MED ORDER — MELATONIN 5 MG PO TABS
10.0000 mg | ORAL_TABLET | Freq: Every day | ORAL | Status: DC
  Administered 2022-03-02 – 2022-03-03 (×2): 10 mg via ORAL
  Filled 2022-03-02 (×2): qty 2

## 2022-03-02 MED ORDER — METHYLPREDNISOLONE SODIUM SUCC 125 MG IJ SOLR
125.0000 mg | Freq: Once | INTRAMUSCULAR | Status: AC
  Administered 2022-03-02: 125 mg via INTRAVENOUS
  Filled 2022-03-02: qty 2

## 2022-03-02 NOTE — ED Provider Notes (Cosign Needed)
[name] Provider Note   CSN: [id number] Arrival date & time: 03/02/22  0848     History  Chief Complaint  Patient presents with   Cough    Paul [name] is a 76 y.o. male.   Cough  Patient with medical history notable for COPD, CHF, PAF not on anticoagulation presents today with shortness of breath.  Patient tells me it started yesterday at 12:30 AM, it is associated with a productive cough with "orange sputum".  He has tried a DuoNeb with no relief, also took 17.5 mg of prednisone prior to arrival.  He is on 2 L of oxygen at baseline, has not had to increase his oxygen requirement at home.  He denies any chest pain.  Home Medications Prior to Admission medications   Medication Sig Start Date End Date Taking? Authorizing Provider  acetaminophen (TYLENOL) 325 MG tablet Take 650 mg by mouth every 6 (six) hours as needed for mild pain or fever.    [provider]  albuterol (PROAIR HFA) 108 (90 Base) MCG/ACT inhaler Inhale 2 puffs into the lungs every 6 (six) hours as needed for wheezing or shortness of breath.     [provider]  albuterol (PROVENTIL) (2.5 MG/3ML) 0.083% nebulizer solution Take 3 mLs (2.5 mg total) by nebulization every 6 (six) hours as needed for shortness of breath or wheezing. 02/11/22   Cox, Elnita Maxwell, MD  ALPRAZolam Duanne Moron) 0.25 MG tablet Take 0.25 mg by mouth daily as needed for anxiety.    [provider]  Ascorbic Acid (VITAMIN C) 1000 MG tablet Take 1,000 mg by mouth daily.    [provider]  aspirin EC 81 MG tablet Take 1 tablet (81 mg total) by mouth daily. Swallow whole. 01/28/22   Tommie Raymond, NP  atorvastatin (LIPITOR) 40 MG tablet TAKE 1 TABLET BY MOUTH ONCE DAILY AFTER SUPPER 11/29/21   Cox, Elnita Maxwell, MD  bisoprolol (ZEBETA) 5 MG tablet Take 1 tablet (5 mg total) by mouth daily. 01/28/22   Kathyrn Drown D, NP  Budeson-Glycopyrrol-Formoterol (BREZTRI AEROSPHERE) 160-9-4.8 MCG/ACT AERO  Inhale 2 puffs into the lungs 2 (two) times daily. 03/26/21   Cox, Elnita Maxwell, MD  calcium carbonate (OS-CAL) 600 MG TABS tablet Take 600 mg by mouth daily.    [provider]  Cholecalciferol (VITAMIN D) 50 MCG (2000 UT) CAPS Take 2,000 Units by mouth daily.    [provider]  denosumab (PROLIA) 60 MG/ML SOSY injection Inject 60 mg into the skin every 6 (six) months.    [provider]  diltiazem (CARDIZEM) 60 MG tablet Take 1 tablet (60 mg total) by mouth 4 (four) times daily as needed. Patient taking differently: Take 60 mg by mouth 4 (four) times daily as needed (afib). 01/19/21   Jerline Pain, MD  ferrous sulfate 325 (65 FE) MG tablet Take 325 mg by mouth 2 (two) times daily with a meal.    [provider]  furosemide (LASIX) 40 MG tablet Take 1 tablet (40 mg total) by mouth daily. 03/01/22   Jerline Pain, MD  guaiFENesin (MUCINEX) 600 MG 12 hr tablet Take 600 mg by mouth 2 (two) times daily.    [provider]  loratadine (CLARITIN) 10 MG tablet Take 10 mg by mouth in the morning.    [provider]  Multiple Vitamin (MULTIVITAMIN WITH MINERALS) TABS tablet Take 1 tablet by mouth daily.    [provider]  Niacinamide-Zn-Cu-Methfo-Se-Cr (NICOTINAMIDE PO) Take 1  tablet by mouth in the morning and at bedtime.    [provider]  OXYGEN Inhale 2-2.5 L/min into the lungs See admin instructions. Inhale 2 L/min of oxygen into the lungs at bedtime and when ambulatory    [provider]  pantoprazole (PROTONIX) 40 MG tablet Take 1 tablet by mouth once daily 12/12/21   Jerline Pain, MD  sertraline (ZOLOFT) 50 MG tablet Take 1 tablet (50 mg total) by mouth daily. 12/31/21   Cox, Elnita Maxwell, MD  tiZANidine (ZANAFLEX) 2 MG tablet Take 1 tablet (2 mg total) by mouth every 6 (six) hours as needed for muscle spasms. 11/12/21   [name], MD      Allergies    Daliresp [roflumilast], Levaquin [levofloxacin], and Alendronate     Review of Systems   Review of Systems  Respiratory:  Positive for cough.    Physical Exam Updated Vital Signs BP 139/64    Pulse 94    Temp 98 F (36.7 C) (Oral)    Resp (!) 21    Ht '5\' 6"'$  (1.676 m)    Wt 66.2 kg    SpO2 97%    BMI 23.57 kg/m  Physical Exam Vitals and nursing note reviewed. Exam conducted with a chaperone present.  Constitutional:      Comments: Chronically ill-appearing but nontoxic  HENT:     Head: Normocephalic and atraumatic.  Eyes:     General: No scleral icterus.       Right eye: No discharge.        Left eye: No discharge.     Extraocular Movements: Extraocular movements intact.     Pupils: Pupils are equal, round, and reactive to light.  Cardiovascular:     Rate and Rhythm: Regular rhythm. Tachycardia present.     Pulses: Normal pulses.     Heart sounds: Normal heart sounds. No murmur heard.   No friction rub. No gallop.  Pulmonary:     Effort: No respiratory distress.     Breath sounds: Wheezing present.     Comments: Tachypneic, expiratory wheezes noted on exam. Abdominal:     General: Abdomen is flat. Bowel sounds are normal. There is no distension.     Palpations: Abdomen is soft.     Tenderness: There is no abdominal tenderness.  Skin:    General: Skin is warm and dry.     Coloration: Skin is not jaundiced.  Neurological:     Mental Status: He is alert. Mental status is at baseline.     Coordination: Coordination normal.    ED Results / Procedures / Treatments   Labs (all labs ordered are listed, but only abnormal results are displayed) Labs Reviewed  CBC WITH DIFFERENTIAL/PLATELET - Abnormal; Notable for the following components:      Result Value   WBC 15.3 (*)    Hemoglobin 11.9 (*)    MCH 25.5 (*)    RDW 16.9 (*)    Neutro Abs 12.9 (*)    Monocytes Absolute 1.1 (*)    All other components within normal limits  BRAIN NATRIURETIC PEPTIDE - Abnormal; Notable for the following components:   B Natriuretic Peptide 107.5 (*)     All other components within normal limits  BASIC METABOLIC PANEL - Abnormal; Notable for the following components:   Chloride 97 (*)    CO2 33 (*)    Glucose, Bld 122 (*)    All other components within normal limits  RESP PANEL BY RT-PCR (FLU A&B,  COVID) [id number]    EKG EKG Interpretation  Date/Time:  Saturday March 02 2022 09:19:54 EST Ventricular Rate:  85 PR Interval:  120 QRS Duration: 96 QT Interval:  357 QTC Calculation: 425 R Axis:   66 Text Interpretation: Sinus rhythm Artifact in lead(s) I II III aVR aVL aVF V6 Confirmed by Campbell Stall ([id number]) on 09/08/9149 9:22:27 AM  Radiology DG Chest 2 View  Result Date: 03/02/2022 CLINICAL DATA:  Shortness of breath.  Cough.  COPD. EXAM: CHEST - 2 VIEW COMPARISON:  02/06/2022 FINDINGS: Hyperinflation. Multiple midthoracic compression deformities. Numerous leads and wires project over the chest. Midline trachea. Normal heart size. Atherosclerosis in the transverse aorta. No pleural effusion or pneumothorax. Mild bibasilar scarring. IMPRESSION: 1. No acute cardiopulmonary disease. 2. Hyperinflation. 3.  Aortic Atherosclerosis (ICD10-I70.0). Electronically Signed   By: [name] Miyamoto M.D.   On: 03/02/2022 10:11    Procedures .Critical Care Performed by: Sherrill Raring, PA-C Authorized by: Sherrill Raring, PA-C   Critical care provider statement:    Critical care time (minutes):  30   Critical care start time:  03/02/2022 12:47 PM   Critical care end time:  03/02/2022 1:13 PM   Critical care was necessary to treat or prevent imminent or life-threatening deterioration of the following conditions:  Respiratory failure   Critical care was time spent personally by me on the following activities:  Development of treatment plan with patient or surrogate, discussions with consultants, evaluation of patient's response to treatment, examination of patient, ordering and review of laboratory studies, ordering and review of radiographic studies, ordering and  performing treatments and interventions, pulse oximetry, re-evaluation of patient's condition and review of old charts    Medications Ordered in ED Medications  ipratropium-albuterol (DUONEB) 0.5-2.5 (3) MG/3ML nebulizer solution 3 mL (3 mLs Nebulization Given 03/02/22 0947)  methylPREDNISolone sodium succinate (SOLU-MEDROL) 125 mg/2 mL injection 125 mg (125 mg Intravenous Given 03/02/22 1027)  ipratropium-albuterol (DUONEB) 0.5-2.5 (3) MG/3ML nebulizer solution 3 mL (3 mLs Nebulization Given 03/02/22 1113)  magnesium sulfate IVPB 2 g 50 mL (0 g Intravenous Stopped 03/02/22 1204)  benzonatate (TESSALON) capsule 100 mg (100 mg Oral Given 03/02/22 1107)    ED Course/ Medical Decision Making/ A&P                           Medical Decision Making Amount and/or Complexity of Data Reviewed Labs: ordered. Radiology: ordered.  Risk Prescription drug management. Decision regarding hospitalization.   This patient presents to the ED for concern of shortness of breath, this involves an extensive number of treatment options, and is a complaint that carries with it a high risk of complications and morbidity.  The differential diagnosis includes COPD exacerbation, pneumonia, CHF, PE  Patients presentation is complicated by their history of existing COPD, resulting in frequent COPD exacerbations requiring hospitalizations  During triage patient was 91% despite being on 2 L of oxygen.  Additional history obtained:   Independent historian: Patient's son    Lab Tests:  I ordered, viewed, and personally interpreted labs.  The pertinent results include: CBC shows leukocytosis 15.3 per chart review he does have a chronic leukocytosis still this is elevated compared to previous.  No AKI, no gross electrolyte derangement on BMP.  BNP within normal limits, negative for COVID and flu    Imaging Studies ordered:  I directly visualized the chest x-ray, which showed hyperinflation but no acute  process  I agree with the radiologist interpretation  ECG/Cardiac monitoring:   Per my interpretation, EKG shows sinus rhythm  The patient was maintained on a cardiac monitor.  Visualized monitor strip which showed NSR with HR 94 per my interpretation.    Medicines ordered and prescription drug management:  I ordered medication including: 2 DuoNebs, Solu-Medrol, mag, Tessalon Perles  I have reviewed the patients home medicines and have made adjustments as needed   Test Considered:  Consider CTA but patient presentation is more aligned with COPD exacerbation warranting PE.      Consultations Obtained:  I requested consultation with the hospitalist Dr. Olevia Bowens.  Discussed lab and imaging findings as well as pertinent plan - they recommend: admission    Reevaluation:  After the interventions noted above, I reevaluated the patient and found patient unchanged    Problems addressed / ED Course: 76 year old with history of COPD presenting due to shortness of breath.  He is tachypneic, hypoxic despite oxygen.  Unable to ambulate without oxygen dropping.  Does not have any chest pain, does not appear to be consistent with ACS.  He is not having any chest pain, doubt PE.  Additionally there is nonpleuritic chest pain.  Workup is as detailed above. Despite 3 duonebs, mag and solumedrol patient still feels SOB and is having wheezing on exam. Ultimately feel patient needs to come in due to COPD exacerbation and respiratory failure with hypoxia.   Social Determinants of Health:    Disposition:   After consideration of the diagnostic results and the patients response to treatment, I feel that the patent would benefit from admission.            Final Clinical Impression(s) / ED Diagnoses Final diagnoses:  COPD exacerbation ([place])  Acute respiratory failure with hypercapnia [facility])    Rx / DC Orders ED Discharge Orders     None         Sherrill Raring, [place] 03/02/22  1314

## 2022-03-02 NOTE — Progress Notes (Signed)
Plan of Care Note for accepted transfer ? ? ?Patient: Paul [name] MRN: [id number]   DOA: 03/02/2022 ? ?Facility requesting transfer: [facility].  ?Requesting Provider: Sherrill Raring PA-C and Campbell Stall, DO ?Reason for transfer: COPD exacerbation. ?Facility course: 76 year old male with a past medical history of hyperlipidemia, type II DM, hypertension, GERD, pulmonary nodules, paroxysmal A-fib, CAD, atherosclerosis of aorta, chronic diastolic CHF, COPD on home oxygen at 2 LPM who presented to the emergency department with dyspnea, wheezing, productive cough and an oxygen saturation of 91%.  He was given oxygen 2 LPM via [place], a DuoNeb, magnesium sulfate and Solu-Medrol.  He is doing better, but became weak and decreased his oxygen saturation while exerting.  ED providers are requesting observation for COPD exacerbation.  Accepted to telemetry bed. ? ?Plan of care: ?The patient is accepted for admission to Telemetry unit, at [facility]. ? ?Author: ?Reubin Milan, MD ?03/02/2022 ? ?Check [email or web address] for on-call coverage. ? ?Nursing staff, Please call [facility] number on Amion as soon as patient's arrival, so appropriate admitting provider can evaluate the pt. ?

## 2022-03-02 NOTE — ED Notes (Signed)
Report given to Michael RN with [facility] 

## 2022-03-02 NOTE — Plan of Care (Signed)
  Problem: Education: Goal: Knowledge of General Education information will improve Description: Including pain rating scale, medication(s)/side effects and non-pharmacologic comfort measures Outcome: Progressing   Problem: Health Behavior/Discharge Planning: Goal: Ability to manage health-related needs will improve Outcome: Progressing   Problem: Clinical Measurements: Goal: Ability to maintain clinical measurements within normal limits will improve Outcome: Progressing Goal: Respiratory complications will improve Outcome: Progressing   

## 2022-03-02 NOTE — ED Triage Notes (Signed)
Pt arrives pov, ambulatory to triage with c/o cough and increases shob. Pt on 2 L o2 at home. Denies fever ?

## 2022-03-02 NOTE — H&P (Signed)
History and Physical    Patient: Paul [name] QIW:[id number] DOB: [birth year] DOA: 03/02/2022 DOS: the patient was seen and examined on 03/02/2022 PCP: [name], MD  Patient coming from: Home  Chief Complaint:  Chief Complaint  Patient presents with   Cough   HPI: Paul [name] is a 76 y.o. male with medical history significant of normocytic anemia, aortic atherosclerosis, history of aspiration pneumonia, paroxysmal atrial fibrillation, chronic back pain, COPD, history of COVID-19, type II DM, elevated PSA, GAD, GERD, hyperlipidemia, hypertension, history of lingular pneumonia, osteoporosis, watchman left atrial appendage closure device, T8 compression fracture, RSV bronchitis, chronic respiratory failure on home oxygen at 2 LPM who is coming to the emergency department due to waking up with productive cough of orange looking sputum associated with dyspnea and wheezing that did not respond to a DuoNeb at home.  He stated he had a good day yesterday.  He has chronic rhinorrhea, has developed mild sore throat and abdominal muscle pain due to persistent coughing.  No fever, chills, night sweats, chest pain, palpitations, diaphoresis, PND, orthopnea or pitting edema of the lower extremities.  Appetite is good.  No changes seen taste or smell.  Denied nausea, emesis, diarrhea, constipation or hematochezia.  He is on iron supplementation.  No flank pain, dysuria, frequency or hematuria.  No polyuria, polydipsia, polyphagia or blurred vision.  ED course: Initial vital signs were temperature 98 F, pulse 92, respiration 24, BP 155/79 mmHg and O2 sat 91% on room air. He was given oxygen 2 LPM via [place], a DuoNeb, magnesium sulfate and Solu-Medrol.  He is doing better, but became weak and decreased his oxygen saturation while exerting.  ED providers are requesting observation for COPD exacerbation.  Lab work: CBC is her white count of 15.3, hemoglobin 11.9 g/dL and platelets 259.  Coronavirus and influenza  PCR was negative.  BNP 107.5 pg/mL.  BMP showed a chloride of 97, CO2 of 33 mmol/L.  Glucose 122 mg/dL.  Renal function, anion gap and the rest of the electrolytes were normal.  Imaging: A 2 view chest radiograph showed hyperinflation but no acute cardiopulmonary disease.  It also showed aortic atherosclerosis.  Review of Systems: As mentioned in the history of present illness. All other systems reviewed and are negative.  Past Medical History:  Diagnosis Date   Anemia    Aortic atherosclerosis (HCC)    Aspiration pneumonia due to inhalation of vomitus ([place]) 02/03/2022   Atrial fibrillation (HCC)    Back pain    COPD (chronic obstructive pulmonary disease) (HCC)    COPD exacerbation ([place]) 12/27/2021   COVID-19 12/24/2020   Diabetes mellitus without complication (HCC)    Elevated PSA    GAD (generalized anxiety disorder)    GERD (gastroesophageal reflux disease)    High cholesterol    Hypertension    Lingular pneumonia 01/05/2021   See cxr 01/06/20 rx with zpak/ cefipime x 5 days and then developed purulent sputum again 01/12/21 so rx levcaquin 500 mg daily x 7 days then return for cxr before more    Lung nodule < 6cm on CT 05/29/2016   Osteoporosis    Oxygen deficiency    Pneumonia    January 2022   Presence of Watchman left atrial appendage closure device 08/02/2021   s/p LAAO by Dr. Quentin Ore with a 20 mm Watchman FLX device   RSV bronchitis 01/27/2022   Vertebral compression fracture ([place]) 05/29/2019   T8 compression fracture noted on CT scan 05/28/2019 inpatient with chronic  steroid dependent COPD - rx calcitonin nasal spray rx per PCP    Past Surgical History:  Procedure Laterality Date   CATARACT EXTRACTION Right    IR KYPHO EA ADDL LEVEL THORACIC OR LUMBAR  11/28/2021   LEFT ATRIAL APPENDAGE OCCLUSION N/A 08/02/2021   Procedure: LEFT ATRIAL APPENDAGE OCCLUSION;  Surgeon: Sherren Mocha, MD;  Location: [facility];  Service: Cardiovascular;  Laterality: N/A;   SKIN SURGERY      shoulders and chest   TEE WITHOUT CARDIOVERSION N/A 08/02/2021   Procedure: TRANSESOPHAGEAL ECHOCARDIOGRAM (TEE);  Surgeon: Sherren Mocha, MD;  Location: [facility];  Service: Cardiovascular;  Laterality: N/A;   TEE WITHOUT CARDIOVERSION N/A 09/13/2021   Procedure: TRANSESOPHAGEAL ECHOCARDIOGRAM (TEE);  Surgeon: Sanda Klein, MD;  Location: [facility];  Service: Cardiovascular;  Laterality: N/A;   Social History:  reports that he quit smoking about 13 years ago. His smoking use included cigarettes. He has a 104.00 pack-year smoking history. He has never used smokeless tobacco. He reports that he does not currently use alcohol. He reports that he does not use drugs.  Allergies  Allergen Reactions   Daliresp [Roflumilast] Diarrhea and Nausea And Vomiting   Levaquin [Levofloxacin] Palpitations    Made the B/P fluctuate and heart raced   Alendronate Anxiety and Other (See Comments)    Jittery/nervous    Family History  Problem Relation Age of Onset   Heart disease Brother    Heart disease Mother    Heart disease Father    Cancer Paternal Uncle     Prior to Admission medications   Medication Sig Start Date End Date Taking? Authorizing Provider  acetaminophen (TYLENOL) 325 MG tablet Take 650 mg by mouth every 6 (six) hours as needed for mild pain or fever.    [provider]  albuterol (PROAIR HFA) 108 (90 Base) MCG/ACT inhaler Inhale 2 puffs into the lungs every 6 (six) hours as needed for wheezing or shortness of breath.     [provider]  albuterol (PROVENTIL) (2.5 MG/3ML) 0.083% nebulizer solution Take 3 mLs (2.5 mg total) by nebulization every 6 (six) hours as needed for shortness of breath or wheezing. 02/11/22   Cox, Elnita Maxwell, MD  ALPRAZolam Duanne Moron) 0.25 MG tablet Take 0.25 mg by mouth daily as needed for anxiety.    [provider]  Ascorbic Acid (VITAMIN C) 1000 MG tablet Take 1,000 mg by mouth daily.    [provider]  aspirin EC  81 MG tablet Take 1 tablet (81 mg total) by mouth daily. Swallow whole. 01/28/22   Tommie Raymond, NP  atorvastatin (LIPITOR) 40 MG tablet TAKE 1 TABLET BY MOUTH ONCE DAILY AFTER SUPPER 11/29/21   Cox, Elnita Maxwell, MD  bisoprolol (ZEBETA) 5 MG tablet Take 1 tablet (5 mg total) by mouth daily. 01/28/22   Kathyrn Drown D, NP  Budeson-Glycopyrrol-Formoterol (BREZTRI AEROSPHERE) 160-9-4.8 MCG/ACT AERO Inhale 2 puffs into the lungs 2 (two) times daily. 03/26/21   Cox, Elnita Maxwell, MD  calcium carbonate (OS-CAL) 600 MG TABS tablet Take 600 mg by mouth daily.    [provider]  Cholecalciferol (VITAMIN D) 50 MCG (2000 UT) CAPS Take 2,000 Units by mouth daily.    [provider]  denosumab (PROLIA) 60 MG/ML SOSY injection Inject 60 mg into the skin every 6 (six) months.    [provider]  diltiazem (CARDIZEM) 60 MG tablet Take 1 tablet (60 mg total) by mouth 4 (four) times daily as needed. Patient taking differently:  Take 60 mg by mouth 4 (four) times daily as needed (afib). 01/19/21   Jerline Pain, MD  ferrous sulfate 325 (65 FE) MG tablet Take 325 mg by mouth 2 (two) times daily with a meal.    [provider]  furosemide (LASIX) 40 MG tablet Take 1 tablet (40 mg total) by mouth daily. 03/01/22   Jerline Pain, MD  guaiFENesin (MUCINEX) 600 MG 12 hr tablet Take 600 mg by mouth 2 (two) times daily.    [provider]  loratadine (CLARITIN) 10 MG tablet Take 10 mg by mouth in the morning.    [provider]  Multiple Vitamin (MULTIVITAMIN WITH MINERALS) TABS tablet Take 1 tablet by mouth daily.    [provider]  Niacinamide-Zn-Cu-Methfo-Se-Cr (NICOTINAMIDE PO) Take 1 tablet by mouth in the morning and at bedtime.    [provider]  OXYGEN Inhale 2-2.5 L/min into the lungs See admin instructions. Inhale 2 L/min of oxygen into the lungs at bedtime and when ambulatory    [provider]  pantoprazole (PROTONIX) 40 MG tablet Take 1 tablet  by mouth once daily 12/12/21   Jerline Pain, MD  sertraline (ZOLOFT) 50 MG tablet Take 1 tablet (50 mg total) by mouth daily. 12/31/21   Cox, Elnita Maxwell, MD  tiZANidine (ZANAFLEX) 2 MG tablet Take 1 tablet (2 mg total) by mouth every 6 (six) hours as needed for muscle spasms. 11/12/21   [name], MD    Physical Exam: Vitals:   03/02/22 1114 03/02/22 1119 03/02/22 1345 03/02/22 1439  BP:    132/67  Pulse:    98  Resp:    16  Temp:    97.9 F (36.6 C)  TempSrc:    Oral  SpO2: 98% 97% 97% 99%  Weight:      Height:       Physical Exam Vitals and nursing note reviewed.  Constitutional:      General: He is awake.     Appearance: Normal appearance. He is normal weight.     Interventions: Nasal cannula in place.  HENT:     Head: Normocephalic.     Mouth/Throat:     Mouth: Mucous membranes are moist.  Eyes:     Pupils: Pupils are equal, round, and reactive to light.  Cardiovascular:     Rate and Rhythm: Normal rate and regular rhythm.  Pulmonary:     Effort: Pulmonary effort is normal. No tachypnea.     Breath sounds: Examination of the right-upper field reveals wheezing. Examination of the left-upper field reveals wheezing. Wheezing present.  Abdominal:     General: There is no distension.     Palpations: Abdomen is soft.     Tenderness: There is no abdominal tenderness.  Musculoskeletal:     Cervical back: Neck supple.     Right lower leg: No edema.     Left lower leg: No edema.  Skin:    General: Skin is warm and dry.     Coloration: Skin is not jaundiced.  Neurological:     General: No focal deficit present.     Mental Status: He is alert and oriented to person, place, and time.  Psychiatric:        Mood and Affect: Mood normal.        Behavior: Behavior normal. Behavior is cooperative.   Data Reviewed:  There are no new results to review at this time.  Assessment and Plan: Principal Problem:   Chronic  respiratory failure with hypoxia ([place]) With superimposed:    COPD with acute exacerbation ([place]) Observation/telemetry. Continue supplemental oxygen. Scheduled and as needed bronchodilators. Received Solu-Medrol 125 mg p.o. this morning. Begin prednisone 40 mg p.o. daily tomorrow. Then prednisone 17.5 mg p.o. daily afterwards.  Active Problems:   Coronary artery calcification Continue aspirin 81 mg p.o. daily Continue atorvastatin 40 mg p.o. daily. Continue bisoprolol 5 mg p.o. daily.    Chronic iron deficiency anemia Continue iron supplementation. Follow-up hematocrit and hemoglobin.    Atherosclerosis of aorta/HLD (hyperlipidemia) On atorvastatin.    Essential hypertension On bisoprolol. Continue furosemide 40 mg p.o. daily. Monitor BP, HR, renal function electrolytes.    Chronic diastolic CHF (congestive heart failure) (HCC) No signs of decompensation. Continue bisoprolol and furosemide.    Paroxysmal atrial fibrillation (HCC) CHA?DS?-VASc Score score of at least 6. History of watchman left atrial appendage closure device Not on anticoagulation. Continue bisoprolol for rate control.    GERD (gastroesophageal reflux disease)  Continue pantoprazole 40 mg p.o. daily.    Type 2 diabetes mellitus (HCC) Carbohydrate modified diet. CBG monitoring with RI SS.    GAD (generalized anxiety disorder) Continue sertraline 50 mg p.o. daily. Continue alprazolam 0.25 mg p.o. daily as needed. Requested melatonin to help him sleep tonight.     Advance Care Planning:   Code Status: Full Code   Consults:   Family Communication:   Severity of Illness: The appropriate patient status for this patient is OBSERVATION. Observation status is judged to be reasonable and necessary in order to provide the required intensity of service to ensure the patient's safety. The patient's presenting symptoms, physical exam findings, and initial radiographic and laboratory data in the context of their medical condition is felt to place them at decreased risk  for further clinical deterioration. Furthermore, it is anticipated that the patient will be medically stable for discharge from the hospital within 2 midnights of admission.   Author: Reubin Milan, MD 03/02/2022 3:19 PM  For on call review [email or web address].   This document was prepared using Dragon voice recognition software and may contain some unintended transcription errors.

## 2022-03-03 DIAGNOSIS — I700 Atherosclerosis of aorta: Secondary | ICD-10-CM | POA: Diagnosis present

## 2022-03-03 DIAGNOSIS — E119 Type 2 diabetes mellitus without complications: Secondary | ICD-10-CM | POA: Diagnosis present

## 2022-03-03 DIAGNOSIS — E7800 Pure hypercholesterolemia, unspecified: Secondary | ICD-10-CM | POA: Diagnosis present

## 2022-03-03 DIAGNOSIS — J9602 Acute respiratory failure with hypercapnia: Secondary | ICD-10-CM

## 2022-03-03 DIAGNOSIS — J9621 Acute and chronic respiratory failure with hypoxia: Secondary | ICD-10-CM | POA: Diagnosis present

## 2022-03-03 DIAGNOSIS — Z888 Allergy status to other drugs, medicaments and biological substances status: Secondary | ICD-10-CM | POA: Diagnosis not present

## 2022-03-03 DIAGNOSIS — I10 Essential (primary) hypertension: Secondary | ICD-10-CM | POA: Diagnosis not present

## 2022-03-03 DIAGNOSIS — Z20822 Contact with and (suspected) exposure to covid-19: Secondary | ICD-10-CM | POA: Diagnosis present

## 2022-03-03 DIAGNOSIS — I5032 Chronic diastolic (congestive) heart failure: Secondary | ICD-10-CM

## 2022-03-03 DIAGNOSIS — D509 Iron deficiency anemia, unspecified: Secondary | ICD-10-CM | POA: Diagnosis present

## 2022-03-03 DIAGNOSIS — M810 Age-related osteoporosis without current pathological fracture: Secondary | ICD-10-CM | POA: Diagnosis present

## 2022-03-03 DIAGNOSIS — Z8249 Family history of ischemic heart disease and other diseases of the circulatory system: Secondary | ICD-10-CM | POA: Diagnosis not present

## 2022-03-03 DIAGNOSIS — Z9981 Dependence on supplemental oxygen: Secondary | ICD-10-CM | POA: Diagnosis not present

## 2022-03-03 DIAGNOSIS — J441 Chronic obstructive pulmonary disease with (acute) exacerbation: Secondary | ICD-10-CM | POA: Diagnosis present

## 2022-03-03 DIAGNOSIS — K219 Gastro-esophageal reflux disease without esophagitis: Secondary | ICD-10-CM | POA: Diagnosis present

## 2022-03-03 DIAGNOSIS — Z7982 Long term (current) use of aspirin: Secondary | ICD-10-CM | POA: Diagnosis not present

## 2022-03-03 DIAGNOSIS — I480 Paroxysmal atrial fibrillation: Secondary | ICD-10-CM | POA: Diagnosis present

## 2022-03-03 DIAGNOSIS — Z87891 Personal history of nicotine dependence: Secondary | ICD-10-CM | POA: Diagnosis not present

## 2022-03-03 DIAGNOSIS — Z8701 Personal history of pneumonia (recurrent): Secondary | ICD-10-CM | POA: Diagnosis not present

## 2022-03-03 DIAGNOSIS — Z95818 Presence of other cardiac implants and grafts: Secondary | ICD-10-CM | POA: Diagnosis not present

## 2022-03-03 DIAGNOSIS — I110 Hypertensive heart disease with heart failure: Secondary | ICD-10-CM | POA: Diagnosis present

## 2022-03-03 DIAGNOSIS — Z9841 Cataract extraction status, right eye: Secondary | ICD-10-CM | POA: Diagnosis not present

## 2022-03-03 DIAGNOSIS — Z79899 Other long term (current) drug therapy: Secondary | ICD-10-CM | POA: Diagnosis not present

## 2022-03-03 DIAGNOSIS — Z8616 Personal history of COVID-19: Secondary | ICD-10-CM | POA: Diagnosis not present

## 2022-03-03 DIAGNOSIS — I2510 Atherosclerotic heart disease of native coronary artery without angina pectoris: Secondary | ICD-10-CM | POA: Diagnosis present

## 2022-03-03 DIAGNOSIS — F411 Generalized anxiety disorder: Secondary | ICD-10-CM | POA: Diagnosis present

## 2022-03-03 LAB — COMPREHENSIVE METABOLIC PANEL
ALT: 32 U/L (ref 0–44)
AST: 24 U/L (ref 15–41)
Albumin: 3.3 g/dL — ABNORMAL LOW (ref 3.5–5.0)
Alkaline Phosphatase: 61 U/L (ref 38–126)
Anion gap: 10 (ref 5–15)
BUN: 22 mg/dL (ref 8–23)
CO2: 32 mmol/L (ref 22–32)
Calcium: 9.3 mg/dL (ref 8.9–10.3)
Chloride: 97 mmol/L — ABNORMAL LOW (ref 98–111)
Creatinine, Ser: 0.82 mg/dL (ref 0.61–1.24)
GFR, Estimated: >60 mL/min (ref >60)
Glucose, Bld: 149 mg/dL — ABNORMAL HIGH (ref 70–99)
Potassium: 4.9 mmol/L (ref 3.5–5.1)
Sodium: 139 mmol/L (ref 135–145)
Total Bilirubin: 0.5 mg/dL (ref 0.3–1.2)
Total Protein: 6.4 g/dL — ABNORMAL LOW (ref 6.5–8.1)

## 2022-03-03 LAB — CBC
HCT: 34.1 % — ABNORMAL LOW (ref 39.0–52.0)
Hemoglobin: 10.6 g/dL — ABNORMAL LOW (ref 13.0–17.0)
MCH: 26 pg (ref 26.0–34.0)
MCHC: 31.1 g/dL (ref 30.0–36.0)
MCV: 83.6 fL (ref 80.0–100.0)
Platelets: 240 10*3/uL (ref 150–400)
RBC: 4.08 MIL/uL — ABNORMAL LOW (ref 4.22–5.81)
RDW: 16.7 % — ABNORMAL HIGH (ref 11.5–15.5)
WBC: 12.7 10*3/uL — ABNORMAL HIGH (ref 4.0–10.5)
nRBC: 0 % (ref 0.0–0.2)

## 2022-03-03 LAB — GLUCOSE, CAPILLARY
Glucose-Capillary: 117 mg/dL — ABNORMAL HIGH (ref 70–99)
Glucose-Capillary: 188 mg/dL — ABNORMAL HIGH (ref 70–99)
Glucose-Capillary: 114 mg/dL — ABNORMAL HIGH (ref 70–99)

## 2022-03-03 MED ORDER — SODIUM CHLORIDE 0.9 % IV SOLN
100.0000 mg | Freq: Two times a day (BID) | INTRAVENOUS | Status: DC
  Administered 2022-03-03 – 2022-03-04 (×3): 100 mg via INTRAVENOUS
  Filled 2022-03-03 (×4): qty 100

## 2022-03-03 MED ORDER — FUROSEMIDE 40 MG PO TABS
40.0000 mg | ORAL_TABLET | Freq: Every day | ORAL | Status: DC
  Administered 2022-03-04: 40 mg via ORAL
  Filled 2022-03-03: qty 1

## 2022-03-03 NOTE — Plan of Care (Signed)
?  Problem: Education: ?Goal: Knowledge of disease or condition will improve ?Outcome: Progressing ?  ?Problem: Activity: ?Goal: Ability to tolerate increased activity will improve ?Outcome: Progressing ?  ?Problem: Respiratory: ?Goal: Levels of oxygenation will improve ?Outcome: Progressing ?  ?Problem: Education: ?Goal: Knowledge of General Education information will improve ?Description: Including pain rating scale, medication(s)/side effects and non-pharmacologic comfort measures ?03/03/2022 0732 by Tana Conch, RN ?Outcome: Progressing ?03/02/2022 1657 by Tana Conch, RN ?Outcome: Progressing ?  ?Problem: Health Behavior/Discharge Planning: ?Goal: Ability to manage health-related needs will improve ?Outcome: Progressing ?  ?Problem: Clinical Measurements: ?Goal: Ability to maintain clinical measurements within normal limits will improve ?Outcome: Progressing ?Goal: Respiratory complications will improve ?Outcome: Progressing ?  ?

## 2022-03-03 NOTE — Progress Notes (Signed)
SATURATION QUALIFICATIONS: (This note is used to comply with regulatory documentation for home oxygen) ? ?Patient Saturations on Room Air at Rest = 94-96% ? ?Patient Saturations on Room Air while Ambulating = 86-88% ? ?Patient Saturations on 2 Liters of oxygen while Ambulating = 96-98% ? ?Please briefly explain why patient needs home oxygen: ?-Pt. Needs 2L oxygen w/ ambulation: it visibly helps w/ SOB. W/o 2L oxygen pt. takes rest periods more frequently. ?

## 2022-03-03 NOTE — TOC Initial Note (Signed)
Transition of Care (TOC) - Initial/Assessment Note  ? ? ?Patient Details  ?Name: Paul [name] ?MRN: [id number] ?Date of Birth: 22-Mar-1946 ? ?Transition of Care (TOC) CM/SW Contact:    ?Tawanna Cooler, RN ?Phone Number: ?03/03/2022, 1:34 PM ? ?Clinical Narrative:                 ? ?Patient has home O2 through [employer].   ?TOC following for additional needs.  ? ? ?Expected Discharge Plan: Home/Self Care ?Barriers to Discharge: Continued Medical Work up ? ? ? ?Expected Discharge Plan and Services ?Expected Discharge Plan: Home/Self Care ?  ?  ?  ?Living arrangements for the past 2 months: [place] ?                ?  ?DME Agency: [employer] ?  ?  ?  ?Prior Living Arrangements/Services ?Living arrangements for the past 2 months: [place] ?Lives with:: Spouse ?Patient language and need for interpreter reviewed:: Yes ?Do you feel safe going back to the place where you live?: Yes      ?Need for Family Participation in Patient Care: Yes (Comment) ?Care giver support system in place?: Yes (comment) ?Current home services: DME (home health through [employer], nebulizer) ?Criminal Activity/Legal Involvement Pertinent to Current Situation/Hospitalization: No - Comment as needed ? ?Activities of Daily Living ?Home Assistive Devices/Equipment: None ?ADL Screening (condition at time of admission) ?Patient's cognitive ability adequate to safely complete daily activities?: Yes ?Is the patient deaf or have difficulty hearing?: No ?Does the patient have difficulty seeing, even when wearing glasses/contacts?: No ?Does the patient have difficulty concentrating, remembering, or making decisions?: No ?Patient able to express need for assistance with ADLs?: Yes ?Does the patient have difficulty dressing or bathing?: No ?Independently performs ADLs?: Yes (appropriate for developmental age) ?Does the patient have difficulty walking or climbing stairs?: No ?Weakness of Legs: None ?Weakness of Arms/Hands: None ? ?  ?Alcohol /  Substance Use: Not Applicable ?Psych Involvement: No (comment) ? ?Admission diagnosis:  COPD exacerbation ([place]) [J44.1] ?Acute respiratory failure with hypercapnia ([place]) [J96.02] ?COPD with acute exacerbation ([place]) [J44.1] ?Patient Active Problem List  ? Diagnosis Date Noted  ? COPD with acute exacerbation ([place]) 03/02/2022  ? Type 2 diabetes mellitus ([place]) 03/02/2022  ? Diarrhea 02/14/2022  ? GERD (gastroesophageal reflux disease) / GI protection on antiplatelet therapy 12/27/2021  ? Lumbar back pain 11/17/2021  ? Acquired thrombophilia ([place]) 10/22/2021  ? Oxygen dependent 10/09/2021  ? Paroxysmal atrial fibrillation ([place]) 08/02/2021  ? Chronic respiratory failure with hypoxia ([place]) 01/16/2021  ? GAD (generalized anxiety disorder) 01/14/2021  ? Chronic diastolic CHF (congestive heart failure) ([place]) 04/01/2020  ? Age-related osteoporosis with healing pathological fracture, chronic back pain 02/06/2020  ? Essential hypertension 01/28/2020  ? Encounter for general adult medical examination with abnormal findings 01/25/2020  ? Chronic bilateral thoracic back pain 01/25/2020  ? 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[place]) 01/25/2020  ? HLD (hyperlipidemia) 01/05/2020  ? Chronic anemia 05/29/2019  ? Pulmonary nodules 05/29/2019  ? Coronary artery calcification 04/06/2019  ? Atherosclerosis of aorta ([place]) 04/06/2019  ? Former cigarette smoker 12/05/2015  ? COPD GOLD  III    ? ?PCP:  [name], MD ?Pharmacy:   ?[id number] [place], [place] - [address] ?[address] ?[facility] [address]?Phone: [phone]: [phone]? ? ? ?Readmission Risk Interventions ?Readmission Risk Prevention Plan 03/03/2022 02/07/2022 01/01/2022  ?Transportation Screening Complete Complete Complete  ?PCP or Specialist Appt  within 3-5 Days Complete Complete Complete  ?[place] or Home Care Consult Complete Complete Complete  ?Social Work Consult for [place] Planning/Counseling Complete Complete Complete  ?Palliative  Care Screening Complete Complete Complete  ?Medication Review Press photographer) Complete Complete Complete  ?PCP or Specialist appointment within 3-5 days of discharge - - -  ?[place] or [place] - - -  ?Palliative Care Screening - - -  ?[place] - - -  ?Some recent data might be hidden  ? ? ? ?

## 2022-03-03 NOTE — Progress Notes (Signed)
[facility] PROGRESS NOTE    Progress Note  CALDEN DORSEY  ZHG:[id number] DOB: 01-13-1946 DOA: 03/02/2022 PCP: [name], MD     Brief Narrative:   NEVAAN BUNTON is an 76 y.o. male past medical history significant for normocytic anemia, aspiration pneumonia, paroxysmal atrial fibrillation not on anticoagulation history of aspiration pneumonia diabetes mellitus type 2 T8 compression fracture, chronic respiratory failure on 2 L of oxygen at home comes in as he woke up with a productive cough dyspneic and wheezing    Assessment/Plan:   COPD with acute exacerbation ([place]): Started on IV steroids, will add and started on IV antibiotics. Chest x-ray showed no infiltrates. He still relates he feels dyspneic especially with minimal ambulation, he is wheezing on physical exam, still having a productive cough relates he is slightly anorexic.  CAD: Continue aspirin statin Blockers.  Chronic iron deficiency anemia: Continue iron supplementation follow-up with PCP as an outpatient.  Dyslipidemia: Continue statins.  Essential hypertension: Continue Lasix to be restarted tomorrow, continue bisoprolol. Blood pressure is controlled.  Chronic diastolic heart failure: Continue Lasix and beta-blocker no signs of decompensation.  Paroxysmal atrial fibrillation: Now in sinus rhythm with CHA?DS?-VASc Score score of at least 6. He has a Watchman left atrial appendage procedure placed. Not on anticoagulation. Rate controlled  GERD: Continue PPI.  Diabetes mellitus type 2 without complications: Continue sliding scale insulin.  General anxiety disorder: Continue sertraline alprazolam, continue melatonin at night.  DVT prophylaxis: lovenox Family Communication:none Status is: Observation The patient will require care spanning > 2 midnights and should be moved to inpatient because: Patient relates his breathing is unchanged compared to yesterday, still feel short of breath when  going from the bed to the chair relates his breathing is now better still having a productive cough    Code Status:     Code Status Orders  (From admission, onward)           Start     Ordered   03/02/22 1520  Full code  Continuous        03/02/22 1519           Code Status History     Date Active Date Inactive Code Status Order ID Comments User Context   02/04/2022 1828 02/08/2022 1753 Full Code [id number]  Marcelyn Bruins, MD Inpatient   12/27/2021 1208 01/04/2022 1620 Full Code [id number]  Emeterio Reeve, DO ED   08/02/2021 1209 08/03/2021 1632 Full Code [id number]  Vickie Epley, MD Inpatient   01/31/2021 0528 02/02/2021 1937 Full Code [id number]  Chotiner, Yevonne Aline, MD Inpatient   01/14/2021 1953 01/15/2021 1806 Full Code [id number]  Neena Rhymes, MD Inpatient   01/05/2021 2203 01/09/2021 2155 Full Code [id number]  Elwyn Reach, MD Inpatient   12/25/2020 2107 12/28/2020 2001 Full Code [id number]  Patrecia Pour, MD Inpatient   12/24/2020 1337 12/25/2020 2107 Full Code [id number]  Eustaquio Maize, PA-C ED   12/08/2020 1432 12/10/2020 1847 Full Code [id number]  Harold Hedge, MD Inpatient   04/01/2020 2011 04/06/2020 1712 Partial Code [id number]  Dessa Phi, DO Inpatient   03/04/2020 1630 03/09/2020 1827 Full Code [id number]  Tomma Rakers, MD Inpatient   02/17/2020 0327 02/20/2020 1638 Full Code [id number]  Rise Patience, MD Inpatient   01/05/2020 0006 01/11/2020 1737 Full Code [id number]  Shela Leff, MD Inpatient   05/28/2019 2320 06/03/2019 1530 Full Code [id number]  Shela Leff, MD ED   05/03/2019 1624 05/06/2019 1852 Full Code [id number]  Velna Ochs, MD Inpatient   03/19/2019 2005 03/25/2019 1647 Full Code [id number]  Guilford Shi, MD ED      Advance Directive Documentation    [place] Most Recent Value  Type of Advance Directive Living will, Healthcare Power of Attorney  Pre-existing out of facility DNR order (yellow form or pink MOST  form) --  "MOST" Form in Place? --         IV Access:   Peripheral IV   Procedures and diagnostic studies:   DG Chest 2 View  Result Date: 03/02/2022 CLINICAL DATA:  Shortness of breath.  Cough.  COPD. EXAM: CHEST - 2 VIEW COMPARISON:  02/06/2022 FINDINGS: Hyperinflation. Multiple midthoracic compression deformities. Numerous leads and wires project over the chest. Midline trachea. Normal heart size. Atherosclerosis in the transverse aorta. No pleural effusion or pneumothorax. Mild bibasilar scarring. IMPRESSION: 1. No acute cardiopulmonary disease. 2. Hyperinflation. 3.  Aortic Atherosclerosis (ICD10-I70.0). Electronically Signed   By: [name] Miyamoto M.D.   On: 03/02/2022 10:11     Medical Consultants:   None.   Subjective:    LESEAN WOOLVERTON relates his breathing is not better than yesterday still having a productive cough feels tired and short of breath with just going from the bed to the chair, he still slightly anorexic  Objective:    Vitals:   03/02/22 1914 03/02/22 2003 03/02/22 2344 03/03/22 0320  BP: 123/60  126/62   Pulse: 91  91   Resp: 16  18   Temp: 98.2 F (36.8 C)  98.3 F (36.8 C)   TempSrc:   Oral   SpO2: 98% 98% 97% 97%  Weight:      Height:       SpO2: 97 % O2 Flow Rate (L/min): 2 L/min   Intake/Output Summary (Last 24 hours) at 03/03/2022 0750 Last data filed at 03/03/2022 0500 Gross per 24 hour  Intake 410.05 ml  Output 1200 ml  Net -789.95 ml   Filed Weights   03/02/22 0858  Weight: 66.2 kg    Exam: General exam: In no acute distress. Respiratory system: Moderate air movement with wheezing bilaterally Cardiovascular system: S1 & S2 heard, RRR. No JVD. Gastrointestinal system: Abdomen is nondistended, soft and nontender.  Extremities: No pedal edema. Skin: No rashes, lesions or ulcers Psychiatry: Judgement and insight appear normal. Mood & affect appropriate.    Data Reviewed:    Labs: Basic Metabolic Panel: Recent Labs   Lab 03/02/22 1000 03/03/22 0525  NA 140 139  K 4.0 4.9  CL 97* 97*  CO2 33* 32  GLUCOSE 122* 149*  BUN 15 22  CREATININE 0.76 0.82  CALCIUM 9.7 9.3   GFR Estimated Creatinine Clearance: 69.2 mL/min (by C-G formula based on SCr of 0.82 mg/dL). Liver Function Tests: Recent Labs  Lab 03/03/22 0525  AST 24  ALT 32  ALKPHOS 61  BILITOT 0.5  PROT 6.4*  ALBUMIN 3.3*   No results for input(s): LIPASE, AMYLASE in the last 168 hours. No results for input(s): AMMONIA in the last 168 hours. Coagulation profile No results for input(s): INR, PROTIME in the last 168 hours. COVID-19 Labs  No results for input(s): DDIMER, FERRITIN, LDH, CRP in the last 72 hours.  Lab Results  Component Value Date   SARSCOV2NAA NEGATIVE 03/02/2022   SARSCOV2NAA NEGATIVE 02/04/2022   SARSCOV2NAA NEGATIVE 12/27/2021   SARSCOV2NAA RESULT: NEGATIVE 07/31/2021    CBC: Recent Labs  Lab 03/02/22 0924 03/03/22 0525  WBC 15.3* 12.7*  NEUTROABS 12.9*  --  HGB 11.9* 10.6*  HCT 39.6 34.1*  MCV 84.8 83.6  PLT 259 240   Cardiac Enzymes: No results for input(s): CKTOTAL, CKMB, CKMBINDEX, TROPONINI in the last 168 hours. BNP (last 3 results) No results for input(s): PROBNP in the last 8760 hours. CBG: Recent Labs  Lab 03/02/22 1604 03/02/22 2045 03/03/22 0735  GLUCAP 167* 192* 117*   D-Dimer: No results for input(s): DDIMER in the last 72 hours. Hgb A1c: No results for input(s): HGBA1C in the last 72 hours. Lipid Profile: No results for input(s): CHOL, HDL, LDLCALC, TRIG, CHOLHDL, LDLDIRECT in the last 72 hours. Thyroid function studies: No results for input(s): TSH, T4TOTAL, T3FREE, THYROIDAB in the last 72 hours.  Invalid input(s): FREET3 Anemia work up: No results for input(s): VITAMINB12, FOLATE, FERRITIN, TIBC, IRON, RETICCTPCT in the last 72 hours. Sepsis Labs: Recent Labs  Lab 03/02/22 0924 03/03/22 0525  WBC 15.3* 12.7*   Microbiology Recent Results (from the past 240  hour(s))  Resp Panel by RT-PCR (Flu A&B, Covid) Nasopharyngeal Swab     Status: None   Collection Time: 03/02/22  9:24 AM   Specimen: Nasopharyngeal Swab; Nasopharyngeal(NP) swabs in vial transport medium  Result Value Ref Range Status   SARS Coronavirus 2 by RT PCR NEGATIVE NEGATIVE Final    Comment: (NOTE) SARS-CoV-2 target nucleic acids are NOT DETECTED.  The SARS-CoV-2 RNA is generally detectable in upper respiratory specimens during the acute phase of infection. The lowest concentration of SARS-CoV-2 viral copies this assay can detect is 138 copies/mL. A negative result does not preclude SARS-Cov-2 infection and should not be used as the sole basis for treatment or other patient management decisions. A negative result may occur with  improper specimen collection/handling, submission of specimen other than nasopharyngeal swab, presence of viral mutation(s) within the areas targeted by this assay, and inadequate number of viral copies(<138 copies/mL). A negative result must be combined with clinical observations, patient history, and epidemiological information. The expected result is Negative.  Fact Sheet for Patients:  EntrepreneurPulse.com.au  Fact Sheet for Healthcare Providers:  IncredibleEmployment.be  This test is no t yet approved or cleared by the [place] FDA and  has been authorized for detection and/or diagnosis of SARS-CoV-2 by FDA under an Emergency Use Authorization (EUA). This EUA will remain  in effect (meaning this test can be used) for the duration of the COVID-19 declaration under Section 564(b)(1) of the Act, 21 U.S.C.section 360bbb-3(b)(1), unless the authorization is terminated  or revoked sooner.       Influenza A by PCR NEGATIVE NEGATIVE Final   Influenza B by PCR NEGATIVE NEGATIVE Final    Comment: (NOTE) The Xpert Xpress SARS-CoV-2/FLU/RSV plus assay is intended as an aid in the diagnosis of influenza from  Nasopharyngeal swab specimens and should not be used as a sole basis for treatment. Nasal washings and aspirates are unacceptable for Xpert Xpress SARS-CoV-2/FLU/RSV testing.  Fact Sheet for Patients: EntrepreneurPulse.com.au  Fact Sheet for Healthcare Providers: IncredibleEmployment.be  This test is not yet approved or cleared by the [place] FDA and has been authorized for detection and/or diagnosis of SARS-CoV-2 by FDA under an Emergency Use Authorization (EUA). This EUA will remain in effect (meaning this test can be used) for the duration of the COVID-19 declaration under Section 564(b)(1) of the Act, 21 U.S.C. section 360bbb-3(b)(1), unless the authorization is terminated or revoked.  Performed at [facility], [address], [place], [place] [address]      Medications:  albuterol  2.5 mg Nebulization Q6H   vitamin C  1,000 mg Oral Daily   aspirin EC  81 mg Oral Daily   atorvastatin  40 mg Oral q1800   bisoprolol  5 mg Oral Daily   dextromethorphan-guaiFENesin  1 tablet Oral BID   enoxaparin (LOVENOX) injection  40 mg Subcutaneous Q24H   ferrous sulfate  325 mg Oral BID WC   furosemide  40 mg Oral Daily   insulin aspart  0-15 Units Subcutaneous TID WC   loratadine  10 mg Oral q AM   melatonin  10 mg Oral QHS   mometasone-formoterol  2 puff Inhalation BID   And   umeclidinium bromide  1 puff Inhalation Daily   predniSONE  40 mg Oral Q breakfast   sertraline  50 mg Oral QHS   Continuous Infusions:    LOS: 0 days   Charlynne Cousins  [facility]  03/03/2022, 7:50 AM

## 2022-03-04 ENCOUNTER — Encounter: Payer: Self-pay | Admitting: Internal Medicine

## 2022-03-04 DIAGNOSIS — J9602 Acute respiratory failure with hypercapnia: Secondary | ICD-10-CM | POA: Diagnosis not present

## 2022-03-04 DIAGNOSIS — I10 Essential (primary) hypertension: Secondary | ICD-10-CM

## 2022-03-04 DIAGNOSIS — I5032 Chronic diastolic (congestive) heart failure: Secondary | ICD-10-CM | POA: Diagnosis not present

## 2022-03-04 DIAGNOSIS — J441 Chronic obstructive pulmonary disease with (acute) exacerbation: Secondary | ICD-10-CM | POA: Diagnosis not present

## 2022-03-04 LAB — GLUCOSE, CAPILLARY
Glucose-Capillary: 114 mg/dL — ABNORMAL HIGH (ref 70–99)
Glucose-Capillary: 85 mg/dL (ref 70–99)

## 2022-03-04 MED ORDER — PREDNISONE 10 MG PO TABS: ORAL_TABLET | ORAL | 0 refills | Status: DC

## 2022-03-04 MED ORDER — DOXYCYCLINE HYCLATE 100 MG PO TBEC: 100.0000 mg | DELAYED_RELEASE_TABLET | Freq: Two times a day (BID) | ORAL | 0 refills | Status: AC

## 2022-03-04 NOTE — Consult Note (Signed)
[facility] CM Inpatient Consult ? ? ?03/04/2022 ? ?Paul [name] ?24-Feb-1946 ?[id number] ? ?[facility] CM) ?   ?Patient chart reviewed for less than 30 days unplanned readmission and high risk score for unplanned readmission. Assessed for post hospital chronic disease management services with [facility] CM. ? ?Per chart review, patient has chronic care management services through primary care physician practice, [facility]. Primary provider office provides transition of care follow up.  ? ?Of note, [facility] Care Management services does not replace or interfere with any services that are arranged by inpatient case management or social work.  ? ?Netta Cedars, MSN, RN ?[facility] Liaison ?Mobile Phone [phone]  ?Toll free office [phone]  ?

## 2022-03-04 NOTE — Discharge Summary (Signed)
Physician Discharge Summary  Paul [name] FXT:[id number] DOB: [birth year] DOA: 03/02/2022  PCP: Paul Brome, MD  Admit date: 03/02/2022 Discharge date: 03/04/2022  Admitted From: Home Disposition:  Home  Recommendations for Outpatient Follow-up:  Follow up with PCP in 1-2 weeks Please obtain BMP/CBC in one week   Home Health:No Equipment/Devices:None  Discharge Condition:Stable CODE STATUS:Full Diet recommendation: Heart Healthy  Brief/Interim Summary: 76 y.o. male past medical history significant for normocytic anemia, aspiration pneumonia, paroxysmal atrial fibrillation not on anticoagulation history of aspiration pneumonia diabetes mellitus type 2 T8 compression fracture, chronic respiratory failure on 2 L of oxygen at home comes in as he woke up with a productive cough dyspneic and wheezing  Discharge Diagnoses:  Principal Problem:   COPD with acute exacerbation ([place]) Active Problems:   GAD (generalized anxiety disorder)   Coronary artery calcification   Atherosclerosis of aorta (HCC)   Chronic anemia   HLD (hyperlipidemia)   Essential hypertension   Chronic diastolic CHF (congestive heart failure) (HCC)   Chronic respiratory failure with hypoxia (HCC)   Paroxysmal atrial fibrillation (HCC)   GERD (gastroesophageal reflux disease) / GI protection on antiplatelet therapy   Type 2 diabetes mellitus ([place])  COPD exacerbation/chronic respiratory failure with hypoxia: He was started on IV steroids IV antibiotics chest x-ray showed no infiltrates. His dyspnea came to improve we ambulated him and his saturations remained stable on 2 L. He will go home on steroids taper and antibiotics.  CAD: Continue aspirin and statins and beta-blockers.  Chronic iron deficiency anemia: Paul [name] may continue iron supplement.  Essential hypertension: No change made to his medication continue current regimen.  Chronic diastolic heart failure: Continue Lasix and beta-blocker no signs of  decompensation.  Paroxysmal atrial fibrillation: Now in sinus rhythm with CHA?DS?-VASc Score score of at least 6. He has a Watchman left atrial appendage procedure placed. Not on anticoagulation.   Diabetes mellitus type 2: No change made to his medication.  General anxiety disorder: Continue sertraline, alprazolam and melatonin at night no changes made to his medication.  Discharge Instructions  Discharge Instructions     Diet - low sodium heart healthy   Complete by: As directed    Increase activity slowly   Complete by: As directed       Allergies as of 03/04/2022       Reactions   Tape Other (See Comments)   PLEASE USE COBAN WRAP IN LIEU OF "TAPE," as it "pulls off the skin!!   Daliresp [roflumilast] Diarrhea, Nausea And Vomiting   Levaquin [levofloxacin] Palpitations, Other (See Comments)   Made the B/P fluctuate and heart raced   Alendronate Anxiety, Other (See Comments)   Jittery/nervous        Medication List     TAKE these medications    acetaminophen 325 MG tablet Commonly known as: TYLENOL Take 650 mg by mouth every 6 (six) hours as needed for mild pain or fever.   ALPRAZolam 0.25 MG tablet Commonly known as: XANAX Take 0.25 mg by mouth 2 (two) times daily as needed for anxiety.   aspirin EC 81 MG tablet Take 1 tablet (81 mg total) by mouth daily. Swallow whole.   atorvastatin 40 MG tablet Commonly known as: LIPITOR TAKE 1 TABLET BY MOUTH ONCE DAILY AFTER SUPPER What changed: See the new instructions.   bisoprolol 5 MG tablet Commonly known as: ZEBETA Take 1 tablet (5 mg total) by mouth daily.   Breztri Aerosphere 160-9-4.8 MCG/ACT Aero Generic drug: Budeson-Glycopyrrol-Formoterol Inhale 2 puffs  into the lungs 2 (two) times daily.   calcium carbonate 600 MG Tabs tablet Commonly known as: OS-CAL Take 600 mg by mouth daily.   denosumab 60 MG/ML Sosy injection Commonly known as: PROLIA Inject 60 mg into the skin every 6 (six) months.    diltiazem 60 MG tablet Commonly known as: Cardizem Take 1 tablet (60 mg total) by mouth 4 (four) times daily as needed. What changed: reasons to take this   doxycycline 100 MG EC tablet Commonly known as: DORYX Take 1 tablet (100 mg total) by mouth 2 (two) times daily for 5 days.   ferrous sulfate 325 (65 FE) MG tablet Take 325 mg by mouth 2 (two) times daily with a meal.   furosemide 40 MG tablet Commonly known as: LASIX Take 1 tablet (40 mg total) by mouth daily. What changed: when to take this   guaiFENesin 600 MG 12 hr tablet Commonly known as: MUCINEX Take 600 mg by mouth in the morning and at bedtime.   loratadine 10 MG tablet Commonly known as: CLARITIN Take 10 mg by mouth in the morning.   LORazepam 0.5 MG tablet Commonly known as: ATIVAN Take 0.5 mg by mouth daily as needed for anxiety.   multivitamin with minerals Tabs tablet Take 1 tablet by mouth daily with breakfast.   NON FORMULARY Take 500 mg by mouth See admin instructions. Nicotinamide 500 mg (Vitamin B-3) tablets: Take 500 mg by mouth in the morning and at bedtime   Omeprazole 20 MG Tbdd Take 20 mg by mouth See admin instructions. Take 20 mg by mouth one to two times a day before meals   OXYGEN Inhale 2-3 L/min into the lungs See admin instructions. Inhale 2-3 L/min into the lungs at bedtime and when ambulatory   pantoprazole 40 MG tablet Commonly known as: PROTONIX Take 1 tablet by mouth once daily   predniSONE 10 MG tablet Commonly known as: DELTASONE Takes 6 tablets for 1 days, then 5 tablets for 1 days, then 4 tablets for 1 days, then 3 tablets for 1 days, then 2 tabs for 1 days, then 1 tab for 1 days, and then stop. What changed:  how much to take how to take this when to take this additional instructions   ProAir HFA 108 (90 Base) MCG/ACT inhaler Generic drug: albuterol Inhale 2 puffs into the lungs every 6 (six) hours as needed for wheezing or shortness of breath.   albuterol (2.5  MG/3ML) 0.083% nebulizer solution Commonly known as: PROVENTIL Take 3 mLs (2.5 mg total) by nebulization every 6 (six) hours as needed for shortness of breath or wheezing.   Probiotic Daily Caps Take 1 capsule by mouth in the morning.   sertraline 50 MG tablet Commonly known as: ZOLOFT Take 1 tablet (50 mg total) by mouth daily. What changed: when to take this   tiZANidine 2 MG tablet Commonly known as: ZANAFLEX Take 1 tablet (2 mg total) by mouth every 6 (six) hours as needed for muscle spasms.   vitamin C 1000 MG tablet Take 1,000 mg by mouth daily.   Vitamin D 50 MCG (2000 UT) Caps Take 2,000 Units by mouth daily.   Vitamin D3 50 MCG (2000 UT) Tabs Take 2,000 Units by mouth in the morning.        Allergies  Allergen Reactions   Tape Other (See Comments)    PLEASE USE COBAN WRAP IN LIEU OF "TAPE," as it "pulls off the skin!!   Daliresp [Roflumilast] Diarrhea and  Nausea And Vomiting   Levaquin [Levofloxacin] Palpitations and Other (See Comments)    Made the B/P fluctuate and heart raced   Alendronate Anxiety and Other (See Comments)    Jittery/nervous    Consultations: None   Procedures/Studies: DG Chest 2 View  Result Date: 03/02/2022 CLINICAL DATA:  Shortness of breath.  Cough.  COPD. EXAM: CHEST - 2 VIEW COMPARISON:  02/06/2022 FINDINGS: Hyperinflation. Multiple midthoracic compression deformities. Numerous leads and wires project over the chest. Midline trachea. Normal heart size. Atherosclerosis in the transverse aorta. No pleural effusion or pneumothorax. Mild bibasilar scarring. IMPRESSION: 1. No acute cardiopulmonary disease. 2. Hyperinflation. 3.  Aortic Atherosclerosis (ICD10-I70.0). Electronically Signed   By: [name] Miyamoto M.D.   On: 03/02/2022 10:11   DG Chest 2 View  Result Date: 02/06/2022 CLINICAL DATA:  COPD.  Recent aspiration pneumonia and COVID. EXAM: CHEST - 2 VIEW COMPARISON:  02/04/2022 FINDINGS: Mid and lower thoracic compression deformities  are suboptimally evaluated. Prior vertebral augmentation within a thoracolumbar junction vertebral body. Appearance is similar to 12/28/2021 Numerous leads and wires project over the chest. Apical lordotic AP frontal radiograph. Midline trachea. Normal heart size. Atherosclerosis in the transverse aorta. No pleural effusion or pneumothorax. Clear lungs. IMPRESSION: No acute cardiopulmonary disease. Electronically Signed   By: [name] Miyamoto M.D.   On: 02/06/2022 14:28   DG Chest Portable 1 View  Result Date: 02/04/2022 CLINICAL DATA:  Cough and fever. EXAM: PORTABLE CHEST 1 VIEW COMPARISON:  12/28/2021 FINDINGS: 1211 hours. The lungs are clear without focal pneumonia, edema, pneumothorax or pleural effusion. Probable tiny granuloma right upper lobe stable since 01/30/2021. The cardiopericardial silhouette is within normal limits for size. The visualized bony structures of the thorax show no acute abnormality. Telemetry leads overlie the chest. IMPRESSION: No active disease. Electronically Signed   By: Misty Stanley M.D.   On: 02/04/2022 12:21   (Echo, Carotid, EGD, Colonoscopy, ERCP)    Subjective: No complaints  Discharge Exam: Vitals:   03/04/22 0740 03/04/22 0742  BP:    Pulse:    Resp:    Temp:    SpO2: 97% 97%   Vitals:   03/04/22 0153 03/04/22 0558 03/04/22 0740 03/04/22 0742  BP:  133/83    Pulse:  84    Resp:  20    Temp:  97.7 F (36.5 C)    TempSrc:  Oral    SpO2: 99% 99% 97% 97%  Weight:      Height:        General: Pt is alert, awake, not in acute distress Cardiovascular: RRR, S1/S2 +, no rubs, no gallops Respiratory: CTA bilaterally, no wheezing, no rhonchi Abdominal: Soft, NT, ND, bowel sounds + Extremities: no edema, no cyanosis    The results of significant diagnostics from this hospitalization (including imaging, microbiology, ancillary and laboratory) are listed below for reference.     Microbiology: Recent Results (from the past 240 hour(s))  Resp Panel  by RT-PCR (Flu A&B, Covid) Nasopharyngeal Swab     Status: None   Collection Time: 03/02/22  9:24 AM   Specimen: Nasopharyngeal Swab; Nasopharyngeal(NP) swabs in vial transport medium  Result Value Ref Range Status   SARS Coronavirus 2 by RT PCR NEGATIVE NEGATIVE Final    Comment: (NOTE) SARS-CoV-2 target nucleic acids are NOT DETECTED.  The SARS-CoV-2 RNA is generally detectable in upper respiratory specimens during the acute phase of infection. The lowest concentration of SARS-CoV-2 viral copies this assay can detect is 138 copies/mL. A negative result  does not preclude SARS-Cov-2 infection and should not be used as the sole basis for treatment or other patient management decisions. A negative result may occur with  improper specimen collection/handling, submission of specimen other than nasopharyngeal swab, presence of viral mutation(s) within the areas targeted by this assay, and inadequate number of viral copies(<138 copies/mL). A negative result must be combined with clinical observations, patient history, and epidemiological information. The expected result is Negative.  Fact Sheet for Patients:  EntrepreneurPulse.com.au  Fact Sheet for Healthcare Providers:  IncredibleEmployment.be  This test is no t yet approved or cleared by the [place] FDA and  has been authorized for detection and/or diagnosis of SARS-CoV-2 by FDA under an Emergency Use Authorization (EUA). This EUA will remain  in effect (meaning this test can be used) for the duration of the COVID-19 declaration under Section 564(b)(1) of the Act, 21 U.S.C.section 360bbb-3(b)(1), unless the authorization is terminated  or revoked sooner.       Influenza A by PCR NEGATIVE NEGATIVE Final   Influenza B by PCR NEGATIVE NEGATIVE Final    Comment: (NOTE) The Xpert Xpress SARS-CoV-2/FLU/RSV plus assay is intended as an aid in the diagnosis of influenza from Nasopharyngeal swab  specimens and should not be used as a sole basis for treatment. Nasal washings and aspirates are unacceptable for Xpert Xpress SARS-CoV-2/FLU/RSV testing.  Fact Sheet for Patients: EntrepreneurPulse.com.au  Fact Sheet for Healthcare Providers: IncredibleEmployment.be  This test is not yet approved or cleared by the [place] FDA and has been authorized for detection and/or diagnosis of SARS-CoV-2 by FDA under an Emergency Use Authorization (EUA). This EUA will remain in effect (meaning this test can be used) for the duration of the COVID-19 declaration under Section 564(b)(1) of the Act, 21 U.S.C. section 360bbb-3(b)(1), unless the authorization is terminated or revoked.  Performed at [facility], [address] [place], [place] [address]      Labs: BNP (last 3 results) Recent Labs    12/27/21 0924 02/04/22 1031 03/02/22 1000  BNP 70.2 120.4* 616.0*   Basic Metabolic Panel: Recent Labs  Lab 03/02/22 1000 03/03/22 0525  NA 140 139  K 4.0 4.9  CL 97* 97*  CO2 33* 32  GLUCOSE 122* 149*  BUN 15 22  CREATININE 0.76 0.82  CALCIUM 9.7 9.3   Liver Function Tests: Recent Labs  Lab 03/03/22 0525  AST 24  ALT 32  ALKPHOS 61  BILITOT 0.5  PROT 6.4*  ALBUMIN 3.3*   No results for input(s): LIPASE, AMYLASE in the last 168 hours. No results for input(s): AMMONIA in the last 168 hours. CBC: Recent Labs  Lab 03/02/22 0924 03/03/22 0525  WBC 15.3* 12.7*  NEUTROABS 12.9*  --   HGB 11.9* 10.6*  HCT 39.6 34.1*  MCV 84.8 83.6  PLT 259 240   Cardiac Enzymes: No results for input(s): CKTOTAL, CKMB, CKMBINDEX, TROPONINI in the last 168 hours. BNP: Invalid input(s): POCBNP CBG: Recent Labs  Lab 03/02/22 2045 03/03/22 0735 03/03/22 1651 03/03/22 2126 03/04/22 0720  GLUCAP 192* 117* 188* 114* 85   D-Dimer No results for input(s): DDIMER in the last 72 hours. Hgb A1c No results for input(s): HGBA1C in the  last 72 hours. Lipid Profile No results for input(s): CHOL, HDL, LDLCALC, TRIG, CHOLHDL, LDLDIRECT in the last 72 hours. Thyroid function studies No results for input(s): TSH, T4TOTAL, T3FREE, THYROIDAB in the last 72 hours.  Invalid input(s): FREET3 Anemia work up No results for input(s): VITAMINB12,  FOLATE, FERRITIN, TIBC, IRON, RETICCTPCT in the last 72 hours. Urinalysis    Component Value Date/Time   COLORURINE YELLOW 01/14/2021 1100   APPEARANCEUR CLEAR 01/14/2021 1100   LABSPEC 1.015 01/14/2021 1100   PHURINE 7.5 01/14/2021 1100   GLUCOSEU NEGATIVE 01/14/2021 1100   HGBUR MODERATE (A) 01/14/2021 1100   BILIRUBINUR Negative 05/10/2021 0931   KETONESUR NEGATIVE 01/14/2021 1100   PROTEINUR Positive (A) 05/10/2021 0931   PROTEINUR NEGATIVE 01/14/2021 1100   UROBILINOGEN negative (A) 05/10/2021 0931   NITRITE Negative 05/10/2021 0931   NITRITE NEGATIVE 01/14/2021 1100   LEUKOCYTESUR Negative 05/10/2021 0931   LEUKOCYTESUR NEGATIVE 01/14/2021 1100   Sepsis Labs Invalid input(s): PROCALCITONIN,  WBC,  LACTICIDVEN Microbiology Recent Results (from the past 240 hour(s))  Resp Panel by RT-PCR (Flu A&B, Covid) Nasopharyngeal Swab     Status: None   Collection Time: 03/02/22  9:24 AM   Specimen: Nasopharyngeal Swab; Nasopharyngeal(NP) swabs in vial transport medium  Result Value Ref Range Status   SARS Coronavirus 2 by RT PCR NEGATIVE NEGATIVE Final    Comment: (NOTE) SARS-CoV-2 target nucleic acids are NOT DETECTED.  The SARS-CoV-2 RNA is generally detectable in upper respiratory specimens during the acute phase of infection. The lowest concentration of SARS-CoV-2 viral copies this assay can detect is 138 copies/mL. A negative result does not preclude SARS-Cov-2 infection and should not be used as the sole basis for treatment or other patient management decisions. A negative result may occur with  improper specimen collection/handling, submission of specimen other than  nasopharyngeal swab, presence of viral mutation(s) within the areas targeted by this assay, and inadequate number of viral copies(<138 copies/mL). A negative result must be combined with clinical observations, patient history, and epidemiological information. The expected result is Negative.  Fact Sheet for Patients:  EntrepreneurPulse.com.au  Fact Sheet for Healthcare Providers:  IncredibleEmployment.be  This test is no t yet approved or cleared by the [place] FDA and  has been authorized for detection and/or diagnosis of SARS-CoV-2 by FDA under an Emergency Use Authorization (EUA). This EUA will remain  in effect (meaning this test can be used) for the duration of the COVID-19 declaration under Section 564(b)(1) of the Act, 21 U.S.C.section 360bbb-3(b)(1), unless the authorization is terminated  or revoked sooner.       Influenza A by PCR NEGATIVE NEGATIVE Final   Influenza B by PCR NEGATIVE NEGATIVE Final    Comment: (NOTE) The Xpert Xpress SARS-CoV-2/FLU/RSV plus assay is intended as an aid in the diagnosis of influenza from Nasopharyngeal swab specimens and should not be used as a sole basis for treatment. Nasal washings and aspirates are unacceptable for Xpert Xpress SARS-CoV-2/FLU/RSV testing.  Fact Sheet for Patients: EntrepreneurPulse.com.au  Fact Sheet for Healthcare Providers: IncredibleEmployment.be  This test is not yet approved or cleared by the [place] FDA and has been authorized for detection and/or diagnosis of SARS-CoV-2 by FDA under an Emergency Use Authorization (EUA). This EUA will remain in effect (meaning this test can be used) for the duration of the COVID-19 declaration under Section 564(b)(1) of the Act, 21 U.S.C. section 360bbb-3(b)(1), unless the authorization is terminated or revoked.  Performed at [facility], [address], [place], [place]  [address]     SIGNED:   Charlynne Cousins, MD  [facility] 03/04/2022, 8:00 AM Pager   If 7PM-7AM, please contact night-coverage [email or web address] Password [email or web address]

## 2022-03-04 NOTE — Plan of Care (Signed)
?  Problem: Education: Goal: Knowledge of disease or condition will improve Outcome: Completed/Met Goal: Knowledge of the prescribed therapeutic regimen will improve Outcome: Completed/Met Goal: Individualized Educational Video(s) Outcome: Completed/Met   Problem: Activity: Goal: Ability to tolerate increased activity will improve Outcome: Completed/Met Goal: Will verbalize the importance of balancing activity with adequate rest periods Outcome: Completed/Met   Problem: Respiratory: Goal: Ability to maintain a clear airway will improve Outcome: Completed/Met Goal: Levels of oxygenation will improve Outcome: Completed/Met Goal: Ability to maintain adequate ventilation will improve Outcome: Completed/Met   Problem: Education: Goal: Knowledge of General Education information will improve Description: Including pain rating scale, medication(s)/side effects and non-pharmacologic comfort measures Outcome: Completed/Met   Problem: Health Behavior/Discharge Planning: Goal: Ability to manage health-related needs will improve Outcome: Completed/Met   Problem: Clinical Measurements: Goal: Ability to maintain clinical measurements within normal limits will improve Outcome: Completed/Met Goal: Will remain free from infection Outcome: Completed/Met Goal: Diagnostic test results will improve Outcome: Completed/Met Goal: Respiratory complications will improve Outcome: Completed/Met Goal: Cardiovascular complication will be avoided Outcome: Completed/Met   Problem: Activity: Goal: Risk for activity intolerance will decrease Outcome: Completed/Met   Problem: Nutrition: Goal: Adequate nutrition will be maintained Outcome: Completed/Met   Problem: Coping: Goal: Level of anxiety will decrease Outcome: Completed/Met   Problem: Elimination: Goal: Will not experience complications related to bowel motility Outcome: Completed/Met Goal: Will not experience complications related to  urinary retention Outcome: Completed/Met   Problem: Pain Managment: Goal: General experience of comfort will improve Outcome: Completed/Met   Problem: Safety: Goal: Ability to remain free from injury will improve Outcome: Completed/Met   Problem: Skin Integrity: Goal: Risk for impaired skin integrity will decrease Outcome: Completed/Met   

## 2022-03-04 NOTE — Progress Notes (Signed)
Pt discharged home today per Dr. Aileen Fass. Pt's IV site D/C'd and WDL. Pt's VSS. Pt provided with home medication list, discharge instructions and prescriptions. Verbalized understanding. Pt left floor via WC in stable condition accompanied by NT.  ?

## 2022-03-04 NOTE — Progress Notes (Signed)
?  Transition of Care (TOC) Screening Note ? ? ?Patient Details  ?Name: Paul [name] ?Date of Birth: 09/14/1946 ? ? ?Transition of Care [facility]) CM/SW Contact:    ?Dessa Phi, RN ?Phone Number: ?03/04/2022, 9:22 AM ? ? ? ?Transition of Care Department [facility]) has reviewed patient and no TOC needs have been identified at this time. We will continue to monitor patient advancement through interdisciplinary progression rounds. If new patient transition needs arise, please place a TOC consult. ?  ?

## 2022-03-05 ENCOUNTER — Telehealth: Payer: Self-pay

## 2022-03-05 NOTE — Telephone Encounter (Signed)
?  Transition Care Management Follow-up Telephone Call ? ?[name] ?[birth year] ? ?Admit Date: 03/02/22 ?Discharge Date: 03/04/22 ?Diagnoses: COPD with acute exacerbation ? ? ?2 day post discharge: 03/06/22 ?7 day post discharge: 03/11/22 ?14 day post discharge: 03/18/22 ? ?Paul [name] was discharged from [facility] on 03/04/22 with the diagnoses listed above.  He was contacted today via telephone in regards to transition of care.   ? ?Admitted after presenting with productive cough, dyspnea, and wheezing.  He was treated with IV steroids and IV antibiotics.  Stable on 2L. ? ?CXR - 1. No acute cardiopulmonary disease. 2. Hyperinflation. 3.  Aortic Atherosclerosis ? ?BNP 107.5 ?HGB 10.6 ?HCT 34.1 ? ?Discharge Instructions: Complete steroid taper and ABT.  Needs CBC/BMP in one week. ? ?Items Reviewed: ?Medications reviewed: yes ?Allergies reviewed: yes ?Dietary changes reviewed: yes ?Referrals reviewed: yes ? ? ?Functional Questionnaire:  ?Activities of Daily Living (ADLs):   ?He states they are independent in the following: ambulation, bathing and hygiene, feeding, continence, grooming, toileting, and dressing ? ?Any transportation issues/concerns?: no ? ?Any patient concerns? no ? ?Confirmed importance and date/time of follow-up visits scheduled yes ?Provider Appointment booked with Dr Henrene Pastor ? ?Confirmed with patient if condition begins to worsen call PCP or go to the ER.  Patient was given the office number and encouraged to call back with question or concerns.  : yes ? ? ?Shelle Iron, LPN ?74/12/87 8:67 AM ? ?

## 2022-03-06 ENCOUNTER — Encounter (HOSPITAL_BASED_OUTPATIENT_CLINIC_OR_DEPARTMENT_OTHER): Payer: Self-pay | Admitting: Emergency Medicine

## 2022-03-06 ENCOUNTER — Emergency Department (HOSPITAL_BASED_OUTPATIENT_CLINIC_OR_DEPARTMENT_OTHER): Payer: Medicare Other

## 2022-03-06 ENCOUNTER — Emergency Department (HOSPITAL_BASED_OUTPATIENT_CLINIC_OR_DEPARTMENT_OTHER)
Admission: EM | Admit: 2022-03-06 | Discharge: 2022-03-06 | Disposition: A | Discharge status: Home / Self Care | Payer: Medicare Other | Attending: Emergency Medicine | Admitting: Emergency Medicine

## 2022-03-06 ENCOUNTER — Other Ambulatory Visit: Payer: Self-pay

## 2022-03-06 DIAGNOSIS — Z7982 Long term (current) use of aspirin: Secondary | ICD-10-CM | POA: Insufficient documentation

## 2022-03-06 DIAGNOSIS — S0990XA Unspecified injury of head, initial encounter: Secondary | ICD-10-CM | POA: Insufficient documentation

## 2022-03-06 DIAGNOSIS — W01198A Fall on same level from slipping, tripping and stumbling with subsequent striking against other object, initial encounter: Secondary | ICD-10-CM | POA: Diagnosis not present

## 2022-03-06 DIAGNOSIS — W19XXXA Unspecified fall, initial encounter: Secondary | ICD-10-CM

## 2022-03-06 DIAGNOSIS — R9431 Abnormal electrocardiogram [ECG] [EKG]: Secondary | ICD-10-CM | POA: Diagnosis not present

## 2022-03-06 DIAGNOSIS — J449 Chronic obstructive pulmonary disease, unspecified: Secondary | ICD-10-CM | POA: Diagnosis not present

## 2022-03-06 DIAGNOSIS — S01111A Laceration without foreign body of right eyelid and periocular area, initial encounter: Secondary | ICD-10-CM | POA: Diagnosis not present

## 2022-03-06 DIAGNOSIS — S01112A Laceration without foreign body of left eyelid and periocular area, initial encounter: Secondary | ICD-10-CM | POA: Diagnosis not present

## 2022-03-06 DIAGNOSIS — S61215A Laceration without foreign body of left ring finger without damage to nail, initial encounter: Secondary | ICD-10-CM | POA: Diagnosis not present

## 2022-03-06 DIAGNOSIS — Z7951 Long term (current) use of inhaled steroids: Secondary | ICD-10-CM | POA: Insufficient documentation

## 2022-03-06 DIAGNOSIS — S61411A Laceration without foreign body of right hand, initial encounter: Secondary | ICD-10-CM | POA: Diagnosis not present

## 2022-03-06 DIAGNOSIS — S0181XA Laceration without foreign body of other part of head, initial encounter: Secondary | ICD-10-CM

## 2022-03-06 DIAGNOSIS — S6992XA Unspecified injury of left wrist, hand and finger(s), initial encounter: Secondary | ICD-10-CM | POA: Diagnosis present

## 2022-03-06 DIAGNOSIS — I672 Cerebral atherosclerosis: Secondary | ICD-10-CM | POA: Diagnosis not present

## 2022-03-06 DIAGNOSIS — J3489 Other specified disorders of nose and nasal sinuses: Secondary | ICD-10-CM | POA: Diagnosis not present

## 2022-03-06 LAB — COMPREHENSIVE METABOLIC PANEL
ALT: 51 U/L — ABNORMAL HIGH (ref 0–44)
AST: 39 U/L (ref 15–41)
Albumin: 3.4 g/dL — ABNORMAL LOW (ref 3.5–5.0)
Alkaline Phosphatase: 75 U/L (ref 38–126)
Anion gap: 10 (ref 5–15)
BUN: 17 mg/dL (ref 8–23)
CO2: 29 mmol/L (ref 22–32)
Calcium: 8.8 mg/dL — ABNORMAL LOW (ref 8.9–10.3)
Chloride: 100 mmol/L (ref 98–111)
Creatinine, Ser: 0.9 mg/dL (ref 0.61–1.24)
GFR, Estimated: >60 mL/min (ref >60)
Glucose, Bld: 168 mg/dL — ABNORMAL HIGH (ref 70–99)
Potassium: 4.2 mmol/L (ref 3.5–5.1)
Sodium: 139 mmol/L (ref 135–145)
Total Bilirubin: 0.5 mg/dL (ref 0.3–1.2)
Total Protein: 6.7 g/dL (ref 6.5–8.1)

## 2022-03-06 LAB — CBC WITH DIFFERENTIAL/PLATELET
Abs Immature Granulocytes: 0.19 10*3/uL — ABNORMAL HIGH (ref 0.00–0.07)
Basophils Absolute: 0 10*3/uL (ref 0.0–0.1)
Basophils Relative: 0 %
Eosinophils Absolute: 0 10*3/uL (ref 0.0–0.5)
Eosinophils Relative: 0 %
HCT: 39.3 % (ref 39.0–52.0)
Hemoglobin: 12 g/dL — ABNORMAL LOW (ref 13.0–17.0)
Immature Granulocytes: 2 %
Lymphocytes Relative: 10 %
Lymphs Abs: 0.9 10*3/uL (ref 0.7–4.0)
MCH: 25.8 pg — ABNORMAL LOW (ref 26.0–34.0)
MCHC: 30.5 g/dL (ref 30.0–36.0)
MCV: 84.5 fL (ref 80.0–100.0)
Monocytes Absolute: 0.4 10*3/uL (ref 0.1–1.0)
Monocytes Relative: 4 %
Neutro Abs: 8.2 10*3/uL — ABNORMAL HIGH (ref 1.7–7.7)
Neutrophils Relative %: 84 %
Platelets: 311 10*3/uL (ref 150–400)
RBC: 4.65 MIL/uL (ref 4.22–5.81)
RDW: 16.8 % — ABNORMAL HIGH (ref 11.5–15.5)
WBC: 9.7 10*3/uL (ref 4.0–10.5)
nRBC: 0 % (ref 0.0–0.2)

## 2022-03-06 LAB — CBG MONITORING, ED: Glucose-Capillary: 142 mg/dL — ABNORMAL HIGH (ref 70–99)

## 2022-03-06 MED ORDER — LIDOCAINE-EPINEPHRINE-TETRACAINE (LET) TOPICAL GEL
3.0000 mL | Freq: Once | TOPICAL | Status: AC
  Administered 2022-03-06: 3 mL via TOPICAL
  Filled 2022-03-06: qty 3

## 2022-03-06 MED ORDER — LIDOCAINE HCL (PF) 1 % IJ SOLN
20.0000 mL | Freq: Once | INTRAMUSCULAR | Status: AC
  Administered 2022-03-06: 20 mL
  Filled 2022-03-06: qty 20

## 2022-03-06 MED ORDER — HYDROCODONE-ACETAMINOPHEN 5-325 MG PO TABS
2.0000 | ORAL_TABLET | Freq: Once | ORAL | Status: AC
  Administered 2022-03-06: 2 via ORAL
  Filled 2022-03-06: qty 2

## 2022-03-06 MED ORDER — ONDANSETRON 4 MG PO TBDP
4.0000 mg | ORAL_TABLET | Freq: Once | ORAL | Status: AC
Start: 2022-03-06 — End: 2022-03-06
  Administered 2022-03-06: 4 mg via ORAL
  Filled 2022-03-06: qty 1

## 2022-03-06 NOTE — ED Provider Notes (Signed)
?[facility] EMER[facility]ENC[facility] DEPARTMENT ?Provider Note ? ? ?CSN: [id number] ?Arrival date & time: 03/06/22  1654 ? ?  ? ?History ? ?Chief Complaint  ?Patient presents with  ? Fall  ? Facial Laceration  ? ? ?Paul [name] is a 76 y.o. male with a past medical history of COPD, A-fib status post Watchman procedure who presents to the emergency department after lightheadedness and fall.  Patient states that he was feeling slightly lightheaded with standing.  Patient states that he felt lightheaded when he stood.  He sat back down and then when he stood up he fell forward hitting his head on a table.  He broke the table, toward the skin on his right hand and lacerated his left ring finger.   ?He states that every time he is hospitalized he feels lightheaded for a few days.  The patient has chronically black stools due to iron use.  He denies a history of GI bleed, abdominal pain, nausea, vomiting, diarrhea or other volume loss situations.  He is up-to-date on his tetanus vaccination ? ? ?Fall ? ? ?  ? ?Home Medications ?Prior to Admission medications   ?Medication Sig Start Date End Date Taking? Authorizing Provider  ?acetaminophen (TYLENOL) 325 MG tablet Take 650 mg by mouth every 6 (six) hours as needed for mild pain or fever.    [provider]  ?albuterol (PROAIR HFA) 108 (90 Base) MCG/ACT inhaler Inhale 2 puffs into the lungs every 6 (six) hours as needed for wheezing or shortness of breath.     [provider]  ?albuterol (PROVENTIL) (2.5 MG/3ML) 0.083% nebulizer solution Take 3 mLs (2.5 mg total) by nebulization every 6 (six) hours as needed for shortness of breath or wheezing. 02/11/22   [name], MD  ?ALPRAZolam Duanne Moron) 0.25 MG tablet Take 0.25 mg by mouth 2 (two) times daily as needed for anxiety.    [provider]  ?Ascorbic Acid (VITAMIN C) 1000 MG tablet Take 1,000 mg by mouth daily.    [provider]  ?aspirin EC 81 MG tablet Take 1 tablet (81 mg total) by mouth  daily. Swallow whole. 01/28/22   Kathyrn Drown D, NP  ?atorvastatin (LIPITOR) 40 MG tablet TAKE 1 TABLET BY MOUTH ONCE DAILY AFTER SUPPER ?Patient taking differently: Take 40 mg by mouth at bedtime. 11/29/21   CoxElnita Maxwell, MD  ?bisoprolol (ZEBETA) 5 MG tablet Take 1 tablet (5 mg total) by mouth daily. 01/28/22   Tommie Raymond, NP  ?Budeson-Glycopyrrol-Formoterol (BREZTRI AEROSPHERE) 160-9-4.8 MCG/ACT AERO Inhale 2 puffs into the lungs 2 (two) times daily. 03/26/21   CoxElnita Maxwell, MD  ?calcium carbonate (OS-CAL) 600 MG TABS tablet Take 600 mg by mouth daily.    [provider]  ?Cholecalciferol (VITAMIN D) 50 MCG (2000 UT) CAPS Take 2,000 Units by mouth daily. ?Patient not taking: Reported on 03/02/2022    [provider]  ?Cholecalciferol (VITAMIN D3) 50 MCG (2000 UT) TABS Take 2,000 Units by mouth in the morning.    [provider]  ?denosumab (PROLIA) 60 MG/ML SOSY injection Inject 60 mg into the skin every 6 (six) months.    [provider]  ?diltiazem (CARDIZEM) 60 MG tablet Take 1 tablet (60 mg total) by mouth 4 (four) times daily as needed. ?Patient taking differently: Take 60 mg by mouth 4 (four) times daily as needed (for A-Fib). 01/19/21   Jerline Pain, MD  ?doxycycline (DORYX) 100 MG EC tablet Take 1 tablet (100 mg total) by  mouth 2 (two) times daily for 5 days. 03/04/22 03/09/22  Charlynne Cousins, MD  ?ferrous sulfate 325 (65 FE) MG tablet Take 325 mg by mouth 2 (two) times daily with a meal.    [provider]  ?furosemide (LASIX) 40 MG tablet Take 1 tablet (40 mg total) by mouth daily. ?Patient taking differently: Take 40 mg by mouth in the morning. 03/01/22   Jerline Pain, MD  ?guaiFENesin (MUCINEX) 600 MG 12 hr tablet Take 600 mg by mouth in the morning and at bedtime.    [provider]  ?loratadine (CLARITIN) 10 MG tablet Take 10 mg by mouth in the morning.    [provider]  ?LORazepam (ATIVAN) 0.5 MG tablet Take 0.5 mg by mouth daily  as needed for anxiety.    [provider]  ?Multiple Vitamin (MULTIVITAMIN WITH MINERALS) TABS tablet Take 1 tablet by mouth daily with breakfast.    [provider]  ?NON FORMULARY Take 500 mg by mouth See admin instructions. Nicotinamide 500 mg (Vitamin B-3) tablets: Take 500 mg by mouth in the morning and at bedtime    [provider]  ?Omeprazole 20 MG TBDD Take 20 mg by mouth See admin instructions. Take 20 mg by mouth one to two times a day before meals    [provider]  ?OXYGEN Inhale 2-3 L/min into the lungs See admin instructions. Inhale 2-3 L/min into the lungs at bedtime and when ambulatory    [provider]  ?pantoprazole (PROTONIX) 40 MG tablet Take 1 tablet by mouth once daily ?Patient not taking: Reported on 03/02/2022 12/12/21   Jerline Pain, MD  ?predniSONE (DELTASONE) 10 MG tablet Takes 6 tablets for 1 days, then 5 tablets for 1 days, then 4 tablets for 1 days, then 3 tablets for 1 days, then 2 tabs for 1 days, then 1 tab for 1 days, and then stop. 03/04/22   Charlynne Cousins, MD  ?Probiotic Product (PROBIOTIC DAILY) CAPS Take 1 capsule by mouth in the morning.    [provider]  ?sertraline (ZOLOFT) 50 MG tablet Take 1 tablet (50 mg total) by mouth daily. ?Patient taking differently: Take 50 mg by mouth at bedtime. 12/31/21   CoxElnita Maxwell, MD  ?tiZANidine (ZANAFLEX) 2 MG tablet Take 1 tablet (2 mg total) by mouth every 6 (six) hours as needed for muscle spasms. ?Patient not taking: Reported on 03/02/2022 11/12/21   [name], MD  ?   ? ?Allergies    ?Tape, Daliresp [roflumilast], Levaquin [levofloxacin], and Alendronate   ? ?Review of Systems   ?Review of Systems ? ?Physical Exam ?Updated Vital Signs ?BP 128/78 (BP Location: Left Arm)   Pulse 72   Temp 98.1 ?F (36.7 ?C) (Oral)   Resp (!) 21   Ht '5\' 6"'$  (1.676 m)   Wt 67.1 kg   SpO2 99%   BMI 23.89 kg/m?  ?Physical Exam ?Vitals and nursing note reviewed.  ?Constitutional:   ?    General: He is not in acute distress. ?   Appearance: He is well-developed. He is not diaphoretic.  ?HENT:  ?   Head: Normocephalic.  ?   Comments: Laceration above the right eyebrow ?No eye pain, normal movement of the facial muscles.  Neuro intact. ?Eyes:  ?   General: No scleral icterus. ?   Conjunctiva/sclera: Conjunctivae normal.  ?Cardiovascular:  ?   Rate and Rhythm: Normal rate and regular rhythm.  ?   Heart sounds: Normal heart  sounds.  ?Pulmonary:  ?   Effort: Pulmonary effort is normal. No respiratory distress.  ?   Breath sounds: Normal breath sounds.  ?Abdominal:  ?   Palpations: Abdomen is soft.  ?   Tenderness: There is no abdominal tenderness.  ?Musculoskeletal:  ?   Cervical back: Normal range of motion and neck supple.  ?   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  Sensation  ?Skin: ?   General: Skin is warm and dry.  ?   Comments: Skin tear of the dorsum of the right hand  ?Neurological:  ?   General: No focal deficit present.  ?   Mental Status: He is alert and oriented to person, place, and time.  ?   Cranial Nerves: No cranial nerve deficit.  ?   Sensory: No sensory deficit.  ?   Motor: No weakness.  ?   Coordination: Coordination normal.  ?   Gait: Gait normal.  ?   Deep Tendon Reflexes: Reflexes normal.  ?Psychiatric:     ?   Behavior: Behavior normal.  ? ? ?ED Results / Procedures / Treatments   ?Labs ?(all labs ordered are listed, but only abnormal results are displayed) ?Labs Reviewed  ?CBC WITH DIFFERENTIAL/PLATELET - Abnormal; Notable for the following components:  ?    Result Value  ? Hemoglobin 12.0 (*)   ? MCH 25.8 (*)   ? RDW 16.8 (*)   ? Neutro Abs 8.2 (*)   ? Abs Immature Granulocytes 0.19 (*)   ? All other components within normal limits  ?COMPREHENSIVE METABOLIC PANEL - Abnormal; Notable for the following components:  ? Glucose, Bld 168 (*)    ? Calcium 8.8 (*)   ? Albumin 3.4 (*)   ? ALT 51 (*)   ? All other components within normal limits  ?CBG MONITORING, ED - Abnormal; Notable for the following components:  ? Glucose-Capillary 142 (*)   ? All oth

## 2022-03-06 NOTE — ED Triage Notes (Signed)
Reports his knee gave out on him causing him to fall hitting a table just pta.  Lac noted to right forehead and large skin tear noted to right hand. ?

## 2022-03-06 NOTE — Discharge Instructions (Signed)
Tissue adhesive wound care instructions. ?Do not scratch, rub, or pick at the adhesive. ?Leave tissue adhesive in place. It will come off naturally after 7-10 days. ?Do not place tape over the adhesive. The adhesive could come off the wound when you pull the tape off. ?Protect the wound from further injury until it is healed. ?Check your wound area every day for signs of infection. Check for: More redness, swelling, or pain,Fluid or blood,Warmth, Pus or a bad smell. ?Do not take baths, swim, or use a hot tub until your health care provider approves. You may only be allowed to take sponge baths. Ask your health care provider if you may take showers.You can usually shower after the first 24 hours. Cover the dressing with a watertight covering when you take a shower. ?Do not soak the area where there is tissue adhesive. ?Do not use any soaps, petroleum jelly products, or ointments on the wound. Certain ointments can weaken the adhesive. ? ? ?WOUND CARE ?Your stitches should dissolve on their own. ? Keep area clean and dry for 24 hours. Do not remove ?bandage, if applied. ? After 24 hours, remove bandage and wash wound ?gently with mild soap and warm water. Reapply ?a new bandage after cleaning wound, if directed. ? Continue daily cleansing with soap and water until ?stitches/staples are removed. ? Do not apply any ointments or creams to the wound ?while stitches/staples are in place, as this may cause ?delayed healing. ? Seek medical careif you experience any of the following ?signs of infection: Swelling, redness, pus drainage, ?streaking, fever >101.0 F ? Seek care if you experience excessive bleeding ?that does not stop after 15-20 minutes of constant, firm ?pressure. ? ? ?

## 2022-03-08 ENCOUNTER — Other Ambulatory Visit: Payer: Self-pay

## 2022-03-08 ENCOUNTER — Ambulatory Visit (HOSPITAL_COMMUNITY)
Admission: RE | Admit: 2022-03-08 | Discharge: 2022-03-08 | Disposition: A | Discharge status: Home / Self Care | Payer: Medicare Other | Source: Ambulatory Visit | Attending: Adult Health | Admitting: Adult Health

## 2022-03-08 DIAGNOSIS — J9611 Chronic respiratory failure with hypoxia: Secondary | ICD-10-CM | POA: Diagnosis not present

## 2022-03-08 DIAGNOSIS — J449 Chronic obstructive pulmonary disease, unspecified: Secondary | ICD-10-CM | POA: Insufficient documentation

## 2022-03-08 LAB — BLOOD GAS, ARTERIAL
Acid-Base Excess: 6.6 mmol/L — ABNORMAL HIGH (ref 0.0–2.0)
Bicarbonate: 30.5 mmol/L — ABNORMAL HIGH (ref 20.0–28.0)
O2 Saturation: 98.5 %
Patient temperature: 37
pCO2 arterial: 40 mmHg (ref 32–48)
pH, Arterial: 7.49 — ABNORMAL HIGH (ref 7.35–7.45)
pO2, Arterial: 94 mmHg (ref 83–108)

## 2022-03-08 NOTE — Progress Notes (Signed)
Patient in today for ABG on 2 lpm Paul [name].  Test done and results in computer. ?

## 2022-03-11 ENCOUNTER — Other Ambulatory Visit: Payer: Self-pay

## 2022-03-11 ENCOUNTER — Encounter: Payer: Self-pay | Admitting: Legal Medicine

## 2022-03-11 ENCOUNTER — Ambulatory Visit (INDEPENDENT_AMBULATORY_CARE_PROVIDER_SITE_OTHER): Payer: Medicare Other | Admitting: Legal Medicine

## 2022-03-11 VITALS — BP 118/56 | HR 89 | Temp 97.7°F | Resp 18 | Ht 66.0 in | Wt 153.0 lb

## 2022-03-11 DIAGNOSIS — R296 Repeated falls: Secondary | ICD-10-CM

## 2022-03-11 DIAGNOSIS — J9611 Chronic respiratory failure with hypoxia: Secondary | ICD-10-CM

## 2022-03-11 DIAGNOSIS — S0181XA Laceration without foreign body of other part of head, initial encounter: Secondary | ICD-10-CM | POA: Diagnosis not present

## 2022-03-11 DIAGNOSIS — J441 Chronic obstructive pulmonary disease with (acute) exacerbation: Secondary | ICD-10-CM | POA: Diagnosis not present

## 2022-03-11 DIAGNOSIS — R0781 Pleurodynia: Secondary | ICD-10-CM | POA: Diagnosis not present

## 2022-03-11 DIAGNOSIS — M79641 Pain in right hand: Secondary | ICD-10-CM | POA: Diagnosis not present

## 2022-03-11 DIAGNOSIS — W19XXXA Unspecified fall, initial encounter: Secondary | ICD-10-CM | POA: Diagnosis not present

## 2022-03-11 DIAGNOSIS — D508 Other iron deficiency anemias: Secondary | ICD-10-CM | POA: Diagnosis not present

## 2022-03-11 DIAGNOSIS — M8000XD Age-related osteoporosis with current pathological fracture, unspecified site, subsequent encounter for fracture with routine healing: Secondary | ICD-10-CM

## 2022-03-11 MED ORDER — DENOSUMAB 60 MG/ML ~~LOC~~ SOSY
60.0000 mg | PREFILLED_SYRINGE | Freq: Once | SUBCUTANEOUS | Status: AC
  Administered 2022-03-11: 60 mg via SUBCUTANEOUS

## 2022-03-11 NOTE — Progress Notes (Addendum)
? ?Subjective:  ?Patient ID: Paul [name], male    DOB: [birth year]  Age: 76 y.o. MRN: [id number] ? ?Chief Complaint  ?Patient presents with  ? Fall  ? COPD  ? Transitions Of Care  ? ? ?HPI ?Patient went to [facility] on 03/02/20[facility]. He was admitted on 03/02/2022 to 03/04/2022. He was diagnosed with COPD with acute exacerbation. He was discharged to home. He presented with productive cough, dyspneic and wheezing after he woke up.  He has oxygen at home 2L. ?They gave him IV steroids and antibiotics. They did Chest X ray showed no infiltrates. They sent him to home with Doxycycline and prednisone. He finished antibiotics 2 days ago. ? ?Patient went to ED at [facility] ED on 03/06/2022. He presented lightheaded and fall. He stated that he was feeling slightly lightheaded with standing and stood. He sat back down and then when he stood up he fell forward hitting his head on a table. He broke the table, toward the skin on his right hand and lacerated his left ring finger. ?  ?Current Outpatient Medications on File Prior to Visit  ?Medication Sig Dispense Refill  ? acetaminophen (TYLENOL) 325 MG tablet Take 650 mg by mouth every 6 (six) hours as needed for mild pain or fever.    ? albuterol (PROAIR HFA) 108 (90 Base) MCG/ACT inhaler Inhale 2 puffs into the lungs every 6 (six) hours as needed for wheezing or shortness of breath.     ? albuterol (PROVENTIL) (2.5 MG/3ML) 0.083% nebulizer solution Take 3 mLs (2.5 mg total) by nebulization every 6 (six) hours as needed for shortness of breath or wheezing. 360 mL 5  ? ALPRAZolam (XANAX) 0.25 MG tablet Take 0.25 mg by mouth 2 (two) times daily as needed for anxiety.    ? Ascorbic Acid (VITAMIN C) 1000 MG tablet Take 1,000 mg by mouth daily.    ? aspirin EC 81 MG tablet Take 1 tablet (81 mg total) by mouth daily. Swallow whole. 90 tablet 3  ? atorvastatin (LIPITOR) 40 MG tablet TAKE 1 TABLET BY MOUTH ONCE DAILY AFTER SUPPER (Patient taking differently: Take 40  mg by mouth at bedtime.) 90 tablet 0  ? bisoprolol (ZEBETA) 5 MG tablet Take 1 tablet (5 mg total) by mouth daily. 90 tablet 3  ? Budeson-Glycopyrrol-Formoterol (BREZTRI AEROSPHERE) 160-9-4.8 MCG/ACT AERO Inhale 2 puffs into the lungs 2 (two) times daily. 32.1 g 3  ? calcium carbonate (OS-CAL) 600 MG TABS tablet Take 600 mg by mouth daily.    ? Cholecalciferol (VITAMIN D) 50 MCG (2000 UT) CAPS Take 2,000 Units by mouth daily.    ? Cholecalciferol (VITAMIN D3) 50 MCG (2000 UT) TABS Take 2,000 Units by mouth in the morning.    ? denosumab (PROLIA) 60 MG/ML SOSY injection Inject 60 mg into the skin every 6 (six) months.    ? diltiazem (CARDIZEM) 60 MG tablet Take 1 tablet (60 mg total) by mouth 4 (four) times daily as needed. (Patient taking differently: Take 60 mg by mouth 4 (four) times daily as needed (for A-Fib).) 30 tablet prn  ? ferrous sulfate 325 (65 FE) MG tablet Take 325 mg by mouth 2 (two) times daily with a meal.    ? furosemide (LASIX) 40 MG tablet Take 1 tablet (40 mg total) by mouth daily. (Patient taking differently: Take 40 mg by mouth in the morning.) 90 tablet 3  ? guaiFENesin (MUCINEX) 600 MG 12 hr tablet Take 600 mg by mouth in  the morning and at bedtime.    ? loratadine (CLARITIN) 10 MG tablet Take 10 mg by mouth in the morning.    ? LORazepam (ATIVAN) 0.5 MG tablet Take 0.5 mg by mouth daily as needed for anxiety.    ? Multiple Vitamin (MULTIVITAMIN WITH MINERALS) TABS tablet Take 1 tablet by mouth daily with breakfast.    ? NON FORMULARY Take 500 mg by mouth See admin instructions. Nicotinamide 500 mg (Vitamin B-3) tablets: Take 500 mg by mouth in the morning and at bedtime    ? Omeprazole 20 MG TBDD Take 20 mg by mouth See admin instructions. Take 20 mg by mouth one to two times a day before meals    ? OXYGEN Inhale 2-3 L/min into the lungs See admin instructions. Inhale 2-3 L/min into the lungs at bedtime and when ambulatory    ? pantoprazole (PROTONIX) 40 MG tablet Take 1 tablet by mouth once  daily 90 tablet 3  ? predniSONE (DELTASONE) 20 MG tablet Take 17 mg by mouth daily with breakfast.    ? Probiotic Product (PROBIOTIC DAILY) CAPS Take 1 capsule by mouth in the morning.    ? sertraline (ZOLOFT) 50 MG tablet Take 1 tablet (50 mg total) by mouth daily. (Patient taking differently: Take 50 mg by mouth at bedtime.) 90 tablet 1  ? tiZANidine (ZANAFLEX) 2 MG tablet Take 1 tablet (2 mg total) by mouth every 6 (six) hours as needed for muscle spasms. 30 tablet 0  ? ?No current facility-administered medications on file prior to visit.  ? ?Past Medical History:  ?Diagnosis Date  ? Anemia   ? Aortic atherosclerosis ([place])   ? Aspiration pneumonia due to inhalation of vomitus ([place]) 02/03/2022  ? Atrial fibrillation ([place])   ? Back pain   ? COPD (chronic obstructive pulmonary disease) ([place])   ? COPD exacerbation ([place]) 12/27/2021  ? COVID-19 12/24/2020  ? Diabetes mellitus without complication ([place])   ? Elevated PSA   ? GAD (generalized anxiety disorder)   ? GERD (gastroesophageal reflux disease)   ? High cholesterol   ? Hypertension   ? Lingular pneumonia 01/05/2021  ? See cxr 01/06/20 rx with zpak/ cefipime x 5 days and then developed purulent sputum again 01/12/21 so rx levcaquin 500 mg daily x 7 days then return for cxr before more   ? Lung nodule < 6cm on CT 05/29/2016  ? Osteoporosis   ? Oxygen deficiency   ? Pneumonia   ? January 2022  ? 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 s/p LAAO by Dr. Quentin Ore with a 20 mm Watchman FLX device  ? RSV bronchitis 01/27/2022  ? Vertebral compression fracture ([place]) 05/29/2019  ? T8 compression fracture noted on CT scan 05/28/2019 inpatient with chronic steroid dependent COPD - rx calcitonin nasal spray rx per PCP   ? ?Past Surgical History:  ?Procedure Laterality Date  ? CATARACT EXTRACTION Right   ? IR KYPHO EA ADDL LEVEL THORACIC OR LUMBAR  11/28/2021  ? LEFT ATRIAL APPENDAGE OCCLUSION N/A 08/02/2021  ? Procedure: LEFT ATRIAL APPENDAGE OCCLUSION;  Surgeon:  Sherren Mocha, MD;  Location: [facility] [facility];  Service: Cardiovascular;  Laterality: N/A;  ? SKIN SURGERY    ? shoulders and chest  ? TEE WITHOUT CARDIOVERSION N/A 08/02/2021  ? Procedure: TRANSESOPHAGEAL ECHOCARDIOGRAM (TEE);  Surgeon: Sherren Mocha, MD;  Location: [place] [facility];  Service: Cardiovascular;  Laterality: N/A;  ? TEE WITHOUT CARDIOVERSION N/A 09/13/2021  ? Procedure:  TRANSESOPHAGEAL ECHOCARDIOGRAM (TEE);  Surgeon: Sanda Klein, MD;  Location: [facility];  Service: Cardiovascular;  Laterality: N/A;  ?  ?Family History  ?Problem Relation Age of Onset  ? Heart disease Brother   ? Heart disease Mother   ? Heart disease Father   ? Cancer Paternal Uncle   ? ?Social History  ? ?Socioeconomic History  ? Marital status: Married  ?  Spouse name: Not on file  ? Number of children: 3  ? Years of education: Not on file  ? Highest education level: Not on file  ?Occupational History  ? Occupation: retired  ?  Comment: truck driver  ?Tobacco Use  ? Smoking status: Former  ?  Packs/day: 2.00  ?  Years: 52.00  ?  Pack years: 104.00  ?  Types: Cigarettes  ?  Quit date: 02/03/2009  ?  Years since quitting: 13.1  ? Smokeless tobacco: Never  ? Tobacco comments:  ?  Counseled to remain smoke free  ?Vaping Use  ? Vaping Use: Never used  ?Substance and Sexual Activity  ? Alcohol use: Not Currently  ?  Alcohol/week: 0.0 standard drinks  ?  Comment: occ  ? Drug use: No  ? Sexual activity: Not on file  ?Other Topics Concern  ? Not on file  ?Social History Narrative  ? Originally from [place]. Previously worked driving a Restaurant manager, fast food. No pets currently. No mold exposure. 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   ?   ? wears sunscreen, brushes and flosses daily, see's dentist bi-annually, has smoke/carbon monoxide detectors, wears a seatbelt and practices gun safety  ?   ? ?Social Determinants of Health  ? ?Financial Resource Strain: Low Risk   ?  Difficulty of Paying Living Expenses: Not hard at all  ?Food Insecurity: No Food Insecurity  ? Worried About Charity fundraiser in the Last Year: Never true  ? Ran Out of Food in the Last Year: Never true  ?T

## 2022-03-12 ENCOUNTER — Other Ambulatory Visit: Payer: Self-pay

## 2022-03-12 ENCOUNTER — Telehealth: Payer: Self-pay

## 2022-03-12 DIAGNOSIS — J9611 Chronic respiratory failure with hypoxia: Secondary | ICD-10-CM

## 2022-03-12 DIAGNOSIS — R296 Repeated falls: Secondary | ICD-10-CM

## 2022-03-12 DIAGNOSIS — W19XXXA Unspecified fall, initial encounter: Secondary | ICD-10-CM

## 2022-03-12 DIAGNOSIS — J441 Chronic obstructive pulmonary disease with (acute) exacerbation: Secondary | ICD-10-CM

## 2022-03-12 LAB — CBC WITH DIFFERENTIAL/PLATELET
Basophils Absolute: 0 10*3/uL (ref 0.0–0.2)
Basos: 0 %
EOS (ABSOLUTE): 0.1 10*3/uL (ref 0.0–0.4)
Eos: 1 %
Hematocrit: 37.3 % — ABNORMAL LOW (ref 37.5–51.0)
Hemoglobin: 11.7 g/dL — ABNORMAL LOW (ref 13.0–17.7)
Immature Grans (Abs): 0.1 10*3/uL (ref 0.0–0.1)
Immature Granulocytes: 1 %
Lymphocytes Absolute: 2.6 10*3/uL (ref 0.7–3.1)
Lymphs: 20 %
MCH: 25.8 pg — ABNORMAL LOW (ref 26.6–33.0)
MCHC: 31.4 g/dL — ABNORMAL LOW (ref 31.5–35.7)
MCV: 82 fL (ref 79–97)
Monocytes Absolute: 0.8 10*3/uL (ref 0.1–0.9)
Monocytes: 7 %
Neutrophils Absolute: 9.2 10*3/uL — ABNORMAL HIGH (ref 1.4–7.0)
Neutrophils: 71 %
Platelets: 313 10*3/uL (ref 150–450)
RBC: 4.54 x10E6/uL (ref 4.14–5.80)
RDW: 15.4 % (ref 11.6–15.4)
WBC: 12.8 10*3/uL — ABNORMAL HIGH (ref 3.4–10.8)

## 2022-03-12 LAB — COMPREHENSIVE METABOLIC PANEL
ALT: 31 IU/L (ref 0–44)
AST: 24 IU/L (ref 0–40)
Albumin/Globulin Ratio: 1.5 (ref 1.2–2.2)
Albumin: 3.7 g/dL (ref 3.7–4.7)
Alkaline Phosphatase: 75 IU/L (ref 44–121)
BUN/Creatinine Ratio: 14 (ref 10–24)
BUN: 11 mg/dL (ref 8–27)
Bilirubin Total: 0.3 mg/dL (ref 0.0–1.2)
CO2: 29 mmol/L (ref 20–29)
Calcium: 9 mg/dL (ref 8.6–10.2)
Chloride: 99 mmol/L (ref 96–106)
Creatinine, Ser: 0.8 mg/dL (ref 0.76–1.27)
Globulin, Total: 2.4 g/dL (ref 1.5–4.5)
Glucose: 114 mg/dL — ABNORMAL HIGH (ref 70–99)
Potassium: 3.7 mmol/L (ref 3.5–5.2)
Sodium: 145 mmol/L — ABNORMAL HIGH (ref 134–144)
Total Protein: 6.1 g/dL (ref 6.0–8.5)
eGFR: 92 mL/min/{1.73_m2} (ref >59)

## 2022-03-12 MED ORDER — ATORVASTATIN CALCIUM 40 MG PO TABS: 40.0000 mg | ORAL_TABLET | Freq: Every day | ORAL | 0 refills | Status: DC

## 2022-03-12 NOTE — Telephone Encounter (Signed)
Paul [name] with [facility] calling as new orders for physical therapy were placed yesterday. Unfortunately hospice is not able to fulfill these orders. However, patient has previously used [name]. [name] states ordering this with home health will not effect the hospice care.  ? ?[name] requesting new order be placed with this information.  ? ?[name] 03/12/22 10:00 AM ? ?

## 2022-03-12 NOTE — Telephone Encounter (Signed)
[facility] to [facility] ?lp ?

## 2022-03-12 NOTE — Progress Notes (Signed)
Pt has been reminded of appt with CPP on 3/23. ? ?[name], CMA ?Clinical Pharmacist Assistant  ?[phone]  ?

## 2022-03-12 NOTE — Telephone Encounter (Signed)
I put new order in. ?

## 2022-03-12 NOTE — Progress Notes (Signed)
Glucose 114, kidney tests normal, sodium high 145, Liver tests normal, WBC chronically high, chronic anemia, stable ?lp

## 2022-03-13 NOTE — Progress Notes (Signed)
Called and spoke with patient, provided results/recommendations per Rexene Edison NP.  He verbalized understanding.  He has a f/u with Dr. Melvyn Novas on 3/28.  Nothing further needed.

## 2022-03-14 ENCOUNTER — Ambulatory Visit (INDEPENDENT_AMBULATORY_CARE_PROVIDER_SITE_OTHER): Payer: Medicare Other

## 2022-03-14 ENCOUNTER — Other Ambulatory Visit: Payer: Self-pay

## 2022-03-14 DIAGNOSIS — E1169 Type 2 diabetes mellitus with other specified complication: Secondary | ICD-10-CM

## 2022-03-14 DIAGNOSIS — I480 Paroxysmal atrial fibrillation: Secondary | ICD-10-CM

## 2022-03-14 DIAGNOSIS — J441 Chronic obstructive pulmonary disease with (acute) exacerbation: Secondary | ICD-10-CM

## 2022-03-14 NOTE — Patient Instructions (Signed)
Visit Information ? ? Goals Addressed   ?None ?  ? ?Patient Care Plan: [place]  ?  ? ?Problem Identified: afib, dm, copd, hld   ?Priority: High  ?Onset Date: 02/14/2021  ?  ? ?Long-Range Goal: Disease Management   ?Start Date: 02/14/2021  ?Expected End Date: 02/14/2022  ?Recent Progress: On track  ?Priority: High  ?Note:   ?Current Barriers:  ?Unable to independently afford treatment regimen ? ?Pharmacist Clinical Goal(s):  ?Over the next 90 days, patient will verbalize ability to afford treatment regimen through collaboration with PharmD and provider.  ? ?Interventions: ?1:1 collaboration with Cox, Kirsten, MD regarding development and update of comprehensive plan of care as evidenced by provider attestation and co-signature ?Inter-disciplinary care team collaboration (see longitudinal plan of care) ?Comprehensive medication review performed; medication list updated in electronic medical record ? ?Depression/Anxiety ?-Controlled ?-Current treatment: ?Sertraline '50mg'$  QD Appropriate, Effective, Safe, Accessible ?-Medications previously tried/failed: N/A ?-PHQ9:  ? ?  02/15/2022  ?  8:33 AM 01/14/2022  ? 11:14 AM 12/11/2021  ?  1:11 PM  ?Depression screen PHQ 2/9  ?Decreased Interest 0 0 0  ?Down, Depressed, Hopeless 0 0 0  ?PHQ - 2 Score 0 0 0  ?-GAD7:  ? ?  02/15/2022  ?  8:57 AM  ?GAD 7 : Generalized Anxiety Score  ?Nervous, Anxious, on Edge 0  ?Control/stop worrying 1  ?Worry too much - different things 0  ?Trouble relaxing 1  ?Restless 1  ?Easily annoyed or irritable 0  ?Afraid - awful might happen 0  ?Total GAD 7 Score 3  ?Anxiety Difficulty Not difficult at all  ?-Educated on Benefits of medication for symptom control ?-Recommended to continue current medication ? ? ?Hyperlipidemia: (LDL goal < 100) ?The 10-year ASCVD risk score (Arnett DK, et al., 2019) is: 40.9% ?  Values used to calculate the score: ?    Age: 50 years ?    Sex: Male ?    Is Non-Hispanic African American: No ?    Diabetic: Yes ?     Tobacco smoker: No ?    Systolic Blood Pressure: 818 mmHg ?    Is BP treated: Yes ?    HDL Cholesterol: 54 mg/dL ?    Total Cholesterol: 146 mg/dL ?Lab Results  ?Component Value Date  ? CHOL 146 02/11/2022  ? CHOL 144 10/05/2021  ? CHOL 159 07/03/2021  ? ?Lab Results  ?Component Value Date  ? HDL 54 02/11/2022  ? HDL 44 10/05/2021  ? HDL 53 07/03/2021  ? ?Lab Results  ?Component Value Date  ? [place] 73 02/11/2022  ? [place] 70 10/05/2021  ? [place] 83 07/03/2021  ? ?Lab Results  ?Component Value Date  ? TRIG 104 02/11/2022  ? TRIG 180 (H) 10/05/2021  ? TRIG 131 07/03/2021  ? ?Lab Results  ?Component Value Date  ? CHOLHDL 2.7 02/11/2022  ? CHOLHDL 3.3 10/05/2021  ? CHOLHDL 3.0 07/03/2021  ?No results found for: LDLDIRECT ?BP Readings ? March 2023:  ?03/14/22: 121/57, 85 ?03/13/22: 128/59, 79 ?03/12/22: 134/58, 83 ?-Controlled ?-Current treatment: ?Atorvastatin 40 mg daily after supper Appropriate, Effective, Safe, Accessible ?-Medications previously tried: none reported  ?-Current dietary patterns: eats healthy ?-Current exercise habits: starting pulmonary rehab ?-Educated on Cholesterol goals;  ?Benefits of statin for ASCVD risk reduction; ?Importance of limiting foods high in cholesterol; ?-Counseled on diet and exercise extensively ?March 2023: Patient fell recently most likely due to Orthostasis. Patient's BP isn't extremely low and he states these numbers are  normal for him. Counseled extensively about Orthostasis and ways to mitigate it. He states since the fall, he'll put his O2 on before he gets up, then he doesn't feel dizzy ? ?Atrial Fibrillation (Goal: prevent stroke and major bleeding) ?-Controlled ?-CHADSVASC: 5 ?-Current treatment: ?Rate control:  ?Diltiazem CD 120 mg daily Appropriate, Effective, Query Safe, Accessible ?Bisoprolol 5 mg daily Appropriate, Effective, Query Safe, Accessible ?Anticoagulation:  ?ASA '81mg'$  Appropriate, Effective, Safe, Accessible ?-Medications previously tried: Eliquis 5 mg bid  (Watchman device 07/2021 - patient should be transitioning off Eliquis ~09/2021), Clopidogrel '75mg'$  (Will stop Feb 2023 then transition to ASA per Cardio note) ?-Home BP and HR readings:  ?December 2022: 122/54 (Checks daily) ?-Counseled on importance of adherence to anticoagulant exactly as prescribed; ?avoidance of NSAIDs due to increased bleeding risk with anticoagulants; coordinating application of Eliquis once 3% out of pocket proof received.  ?March 2023:  ? ? ?COPD (Goal: control symptoms and prevent exacerbations) ?-Controlled ?-Current treatment  ?Albuterol inhaler 2 puffs every 6 hours prn for wheezing/shortness of breath ($45) Appropriate, Effective, Safe, Accessible ?Breztri 2 puffs bid (PAP) Appropriate, Effective, Safe, Accessible ?-Medications previously tried: Trelegy  ?-Gold Grade: Gold 3 (FEV1 30-49%) ? ?  01/10/2022  ?  3:10 PM 02/05/2021  ?  8:58 AM 01/03/2021  ? 10:37 AM  ?CAT Score  ?Total CAT Score '9 16 17  '$ ?-Exacerbations requiring treatment in last 6 months: yes ?-Patient reports consistent use of maintenance inhaler ?-Frequency of rescue inhaler use: using levalbuterol nebulizer prn ?-Counseled on Proper inhaler technique; ?Benefits of consistent maintenance inhaler use ?December 2022: Patient brought in PAP for Breztri for 2023 on 12/11/21, will make sure is faxed out today. Albuterol is expensive per patient, told him to call me next time he needs refills so I can print out a GoodRx coupon and hopefully get for $20 ?March 2023: F/U November to re-do PAP ? ?GERD (Goal: minimize symptoms of reflux ) ?-Controlled ?-Current treatment  ?Pantoprazole '40mg'$  (Anticoag prophylaxis) Query Appropriate, Query effective, Query Safe, Query accessible ?-Medications previously tried: none reported  ?-Triggering factors: Patient rarely has symptoms ?-Hx of Bleeds/ulcers: No per patient (03/14/22) ?-Counseled on small meals, elevating head, and sleeping on left side ?March 2023: Will ask PCP about Dc'ing since  no longer on anticoag other than ASA ? ?Anemia (Goal: fix Anemia) ?Lab Results  ?Component Value Date  ? WBC 12.8 (H) 03/11/2022  ? HGB 11.7 (L) 03/11/2022  ? HCT 37.3 (L) 03/11/2022  ? MCV 82 03/11/2022  ? PLT 313 03/11/2022  ? ?Lab Results  ?Component Value Date  ? IRON 61 04/05/2021  ? TIBC 241 (L) 04/05/2021  ? FERRITIN 280 04/05/2021  ?-Not ideally controlled ?-Current treatment  ?Ferrous Sulfate '325mg'$  BID Appropriate, Query effective, Safe, Accessible ?-Medications previously tried: N/a  ?March 2023: Patient takes Iron 3382+5053 with food/snack. Takes PPI at 1800. Recommend patient doesn't take iron after PPI. Will ask PCP about possibly [place] PPI since was on for Anticoagulation ? ? ? ? ?Patient Goals/Self-Care Activities ?Over the next 90 days, patient will:  ?- take medications as prescribed ? ?Follow Up Plan: Telephone follow up appointment with care management team member scheduled for: November 2023 ? ?[place] Constable, Pharm.D. - [phone] ? ?  ? ? ?Mr. Navarrete was given information about Chronic Care Management services today including:  ?CCM service includes personalized support from designated clinical staff supervised by his physician, including individualized plan of care and coordination with other care providers ?24/7 contact phone numbers for assistance for  urgent and routine care needs. ?Standard insurance, coinsurance, copays and deductibles apply for chronic care management only during months in which we provide at least 20 minutes of these services. Most insurances cover these services at 100%, however patients may be responsible for any copay, coinsurance and/or deductible if applicable. This service may help you avoid the need for more expensive face-to-face services. ?Only one practitioner may furnish and bill the service in a calendar month. ?The patient may stop CCM services at any time (effective at the end of the month) by phone call to the office staff. ? ?Patient agreed to  services and verbal consent obtained.  ? ?The patient verbalized understanding of instructions, educational materials, and care plan provided today and declined offer to receive copy of patient instructions,

## 2022-03-14 NOTE — Progress Notes (Signed)
? ?Chronic Care Management ?Pharmacy Note ? ?03/14/2022 ?Name:  Paul [name] MRN:  [id number] DOB:  29-Aug-1946  ? ?Plan Updates:  ?Patient no longer on Anticoagulation therapy other than ASA, was on PPI for prophylaxis. Will reach out to Cardio to ask if it's satisfactory to DC PPI to improve Iron absorption and decrease ADR's ? ?Subjective: ?Paul [name] is an 75 y.o. year old male who is a primary patient of Cox, Kirsten, MD.  The CCM team was consulted for assistance with disease management and care coordination needs.   ? ?Engaged with patient by telephone for follow up visit in response to provider referral for pharmacy case management and/or care coordination services.  ? ?Consent to Services:  ?The patient was given information about Chronic Care Management services, agreed to services, and gave verbal consent prior to initiation of services.  Please see initial visit note for detailed documentation.  ? ?Patient Care Team: ?CoxElnita Maxwell, MD as PCP - General (Family Medicine) ?Jerline Pain, MD as PCP - Cardiology (Cardiology) ?Vickie Epley, MD as PCP - Electrophysiology (Cardiology) ?Elsie Stain, MD as Attending Physician (Pulmonary Disease) ?Tanda Rockers, MD as Consulting Physician (Pulmonary Disease) ?[place] as Registered Nurse (Hospice and Palliative Medicine) ?Lane Hacker, [facility] (Pharmacist) ? ?Recent office visits:  ?10/09/21 [name] MD. Seen for DM, HTN, HLD. D/C Alprazolam 0.25 mg 2 times daily.  ?  ?09/06/21 Meredith Mody RN. Seen for Osteoporosis. Injected Denosumab 60 mg.  ?  ?Recent consult visits:  ?09/13/21 Cardiology) Telephone Encounter. Instructed patient to STOP ELIQUIS and ASPIRIN after 09/16/2021 doses. He will also stop omeprazole.Instruct him to START PLAVIX 75 mg daily and pantoprazole 40 mg once daily on 09/17/2021. ?  ?09/11/21 (Pulmonology) Telephone Encounter. Started on Prednisone 5 mg daily.  ?  ?09/06/21 (Cardiology) Angelena Form  PA-C. Seen for A-Fib. Changed Ascorbic Acid from 500 mg to 1000 mg daily. ?  ?Hospital visits:  ?Medication Reconciliation was completed by comparing discharge summary, patient?s EMR and Pharmacy list, and upon discussion with patient. ?  ?Admitted to the hospital on 09/13/21 due to A-Fib. Discharge date was 09/13/21. Discharged from [facility].   ?  ?No medication changes  ? ? ?Objective: ? ?Lab Results  ?Component Value Date  ? CREATININE 0.80 03/11/2022  ? BUN 11 03/11/2022  ? GFRNONAA >60 03/06/2022  ? GFRAA 107 11/20/2020  ? NA 145 (H) 03/11/2022  ? K 3.7 03/11/2022  ? CALCIUM 9.0 03/11/2022  ? CO2 29 03/11/2022  ? GLUCOSE 114 (H) 03/11/2022  ? ? ?Lab Results  ?Component Value Date/Time  ? HGBA1C 6.5 (H) 02/11/2022 08:19 AM  ? HGBA1C 6.5 (H) 12/27/2021 12:04 PM  ? MICROALBUR Negative 10/09/2021 02:30 PM  ?  ?Last diabetic Eye exam: No results found for: HMDIABEYEEXA  ?Last diabetic Foot exam: No results found for: HMDIABFOOTEX  ? ?Lab Results  ?Component Value Date  ? CHOL 146 02/11/2022  ? HDL 54 02/11/2022  ? [place] 73 02/11/2022  ? TRIG 104 02/11/2022  ? CHOLHDL 2.7 02/11/2022  ? ? ? ?  Latest Ref Rng & Units 03/11/2022  ?  9:52 AM 03/06/2022  ?  6:49 PM 03/03/2022  ?  5:25 AM  ?Hepatic Function  ?Total Protein 6.0 - 8.5 g/dL 6.1   6.7   6.4    ?Albumin 3.7 - 4.7 g/dL 3.7   3.4   3.3    ?AST 0 - 40 IU/L 24  39   24    ?ALT 0 - 44 IU/L 31   51   32    ?Alk Phosphatase 44 - 121 IU/L 75   75   61    ?Total Bilirubin 0.0 - 1.2 mg/dL 0.3   0.5   0.5    ? ? ?Lab Results  ?Component Value Date/Time  ? TSH 2.539 03/22/2019 06:21 AM  ? ? ? ?  Latest Ref Rng & Units 03/11/2022  ?  9:52 AM 03/06/2022  ?  6:49 PM 03/03/2022  ?  5:25 AM  ?CBC  ?WBC 3.4 - 10.8 x10E3/uL 12.8   9.7   12.7    ?Hemoglobin 13.0 - 17.7 g/dL 11.7   12.0   10.6    ?Hematocrit 37.5 - 51.0 % 37.3   39.3   34.1    ?Platelets 150 - 450 x10E3/uL 313   311   240    ? ? ?No results found for: VD25OH ? ?Clinical ASCVD: No  ?The 10-year ASCVD  risk score (Arnett DK, et al., 2019) is: 40.9% ?  Values used to calculate the score: ?    Age: 25 years ?    Sex: Male ?    Is Non-Hispanic African American: No ?    Diabetic: Yes ?    Tobacco smoker: No ?    Systolic Blood Pressure: 147 mmHg ?    Is BP treated: Yes ?    HDL Cholesterol: 54 mg/dL ?    Total Cholesterol: 146 mg/dL   ? ? ?  02/15/2022  ?  8:33 AM 01/14/2022  ? 11:14 AM 12/11/2021  ?  1:11 PM  ?Depression screen PHQ 2/9  ?Decreased Interest 0 0 0  ?Down, Depressed, Hopeless 0 0 0  ?PHQ - 2 Score 0 0 0  ?  ? ?Social History  ? ?Tobacco Use  ?Smoking Status Former  ? Packs/day: 2.00  ? Years: 52.00  ? Pack years: 104.00  ? Types: Cigarettes  ? Quit date: 02/03/2009  ? Years since quitting: 13.1  ?Smokeless Tobacco Never  ?Tobacco Comments  ? Counseled to remain smoke free  ? ?BP Readings from Last 3 Encounters:  ?03/11/22 (!) 118/56  ?03/06/22 128/78  ?03/04/22 133/83  ? ?Pulse Readings from Last 3 Encounters:  ?03/11/22 89  ?03/06/22 72  ?03/04/22 84  ? ?Wt Readings from Last 3 Encounters:  ?03/11/22 153 lb (69.4 kg)  ?03/06/22 148 lb (67.1 kg)  ?03/02/22 146 lb (66.2 kg)  ? ?BMI Readings from Last 3 Encounters:  ?03/11/22 24.69 kg/m?  ?03/06/22 23.89 kg/m?  ?03/02/22 23.57 kg/m?  ? ? ?Assessment/Interventions: Review of patient past medical history, allergies, medications, health status, including review of consultants reports, laboratory and other test data, was performed as part of comprehensive evaluation and provision of chronic care management services.  ? ?SDOH:  (Social Determinants of Health) assessments and interventions performed: Yes ?SDOH Interventions   ? ?Flowsheet Row Most Recent Value  ?SDOH Interventions   ?Financial Strain Interventions Other (Comment)  [PAP (See CP)]  ?Transportation Interventions Intervention Not Indicated  ? ?  ? ?SDOH Screenings  ? ?Alcohol Screen: Low Risk   ? Last Alcohol Screening Score (AUDIT): 0  ?Depression (PHQ2-9): Low Risk   ? PHQ-2 Score: 0  ?Financial  Resource Strain: Medium Risk  ? Difficulty of Paying Living Expenses: Somewhat hard  ?Food Insecurity: No Food Insecurity  ? Worried About Charity fundraiser in the Last Year: Never true  ?  Ran Out of Food in the Last Year: Never true  ?Housing: Low Risk   ? Last Housing Risk Score: 0  ?Physical Activity: Insufficiently Active  ? Days of Exercise per Week: 7 days  ? Minutes of Exercise per Session: 10 min  ?Social Connections: Socially Integrated  ? Frequency of Communication with Friends and Family: More than three times a week  ? Frequency of Social Gatherings with Friends and Family: More than three times a week  ? Attends Religious Services: More than 4 times per year  ? Active Member of Clubs or Organizations: Yes  ? Attends Archivist Meetings: More than 4 times per year  ? Marital Status: Married  ?Stress: No Stress Concern Present  ? Feeling of Stress : Only a little  ?Tobacco Use: Medium Risk  ? Smoking Tobacco Use: Former  ? Smokeless Tobacco Use: Never  ? Passive Exposure: Not on file  ?Transportation Needs: No Transportation Needs  ? Lack of Transportation (Medical): No  ? Lack of Transportation (Non-Medical): No  ? ? ?CCM Care Plan ? ?Allergies  ?Allergen Reactions  ? Tape Other (See Comments)  ?  PLEASE USE COBAN WRAP IN LIEU OF "TAPE," as it "pulls off the skin!!  ? Daliresp [Roflumilast] Diarrhea and Nausea And Vomiting  ? Levaquin [Levofloxacin] Palpitations and Other (See Comments)  ?  Made the B/P fluctuate and heart raced  ? Alendronate Anxiety and Other (See Comments)  ?  Jittery/nervous  ? ? ?Medications Reviewed Today   ? ? Reviewed by Lane Hacker, [facility] (Pharmacist) on 03/14/22 at 1421  Med List Status: <None>  ? ?Medication Order Taking? Sig Documenting Provider Last Dose Status Informant  ?acetaminophen (TYLENOL) 325 MG tablet [id number] No Take 650 mg by mouth every 6 (six) hours as needed for mild pain or fever. [provider] Taking Active Spouse/Significant Other   ?albuterol (PROAIR HFA) 108 (90 Base) MCG/ACT inhaler [id number] No Inhale 2 puffs into the lungs every 6 (six) hours as needed for wheezing or shortness of breath.  [provider] Taking Active

## 2022-03-15 NOTE — Progress Notes (Signed)
? ?  Interim Update ?[facility] is the home-based palliative care program of [facility]. ? ?Primary Diagnosis: COPD  ? ?Other significant diagnosis:  CHF, HTN, afib, HLD, depression, atherosclerosis of aorta ? ?Admitted to [facility]: 03/21/20 ? ?Current and previous weight: 154 lbs (admission 137.5lbs) ? ?Current and previous Mid Arm Circumference: 25.5cm on 01/21/22  (admission 24.6cm)  ? ?Current and previous PPS: 70% (admission 50%) ?  ?Hospitalization/ED visit in the last 3 months: Hospitalized for COPD exacerbations on Jan 5-13, Feb 13-17 and Mar 11- 13. ED visit 3/15 after fall  ? ?Current symptoms/issues/interventions: Patient experienced a period of good health in the fall of 2022 with symptoms being well managed. He had an L1 Kyphoplasty on 12/7 and after recovering, he returned to the gym in January. Since January he has had a series of hospitalizations for COPD exacerbations, which often happen for him in the winter months. He sustained a fall on 3/15 which resulted in laceration to face and hand. An Xray on 3/20 revealed fractures to right ribs, 10 &11. Physical therapy order has been placed per patient request. [facility] Nurse makes visits 1-2 times a month and provides assessment  of medical condition, teaching related to disease process and treatments, and coordination of care with medical providers when indicated. , Additionally, SW is available for prn support, and on-call staff are available for after-hours support. Patient has a great relationship with his medical providers and is actively engaged in his healthcare.  ? ?Signs of decline over last 3 months: COPD exacerbations and fall as above  ? ?Goals of Care: Pt's goal is to start playing golf again soon and to avoid/limit hospitalizations. ? ?Advance Directives:  Pt wishes to remain a Full Code. He has a HCPOA (his son), Living Will and MOST form.  ?

## 2022-03-18 NOTE — Progress Notes (Signed)
? ?Paul [name], male    DOB: 08-28-1946,    MRN: [id number] ? ? ?Brief patient profile:  ?69 yowm MM/Quit smoking  02/03/09  with GOLD III spirometry baseline doe x able to golf. Only using 02 at  [facility] with  maint on symbicort/ spiriva and no chronic pred/ albuterol twice daily at most  much worse mid March 2020. ? ? ?PFT ?05/29/16: FVC 2.08 L (86%) FEV1 0.83 L (31%) FEV1/FVC 0.40 FEF 25-75 0.30 L (14%) no bronchodilator response TLC 6.56 L (107%) RV 167% ERV 160% DLCO corrected 60% (hemoglobin 10.9) ?07/15/13: FVC 2.68 L (60%) FEV1 1.63 L (53%) FEV1/FVC 0.61 FEF 25-75 0.76 L (26%) ?02/04/12: FVC 2.18 L (57%) FEV1 1.09 L (36%) FEV1/FVC 0.50 FEF 25-75 0.33 L (11%) ? ? ?Admit date: 03/19/2019 ?Discharge date: 03/25/2019 ?  ?Recommendations for Outpatient Follow-up:  ?F/u with PCP within a week  for hospital discharge follow up, repeat cbc/bmp at follow up ?F/u with cardiology for new diagnosis of afib ?Home 02 ?  ?Discharge Diagnoses:  ?    ?Active Hospital Problems  ?  Diagnosis Date Noted  ? PAF (paroxysmal atrial fibrillation) ([place])    ? Acute on chronic respiratory failure with hypoxia (HCC)    ? COPD exacerbation ([place]) 03/19/2019  ?  ?     ?Filed Weights  ?  03/23/19 1700 03/24/19 0503 03/25/19 0528  ?Weight: 60.2 kg 60 kg 62.3 kg  ?  ?  ?History of present illness: (per admitting MD Dr Earnest Conroy) ?PCP: Paul Brome, MD  ?Patient coming from: Home ?  ?Chief Complaint: Worsening shortness of breath ?  ?HPI: Paul [name] is a 76 y.o. male with history h/o COPD, hyperlipidemia who at baseline was using 2 L O2 via nasal cannula only at night and follows [place] pulmonology (Dr. Vaughan [name]) as outpatient presents with worsening shortness of breath.  Patient reports that at baseline he has cough with white phlegm and is able to walk around doing his ADLs without shortness of breath and uses O2 only at night.  2 weeks back he developed productive cough with green phlegm and shortness of breath but no fevers.  He underwent CT  chest with contrast on March 18 which was negative for PE or infiltrates.  He apparently was given doxycycline/prednisone course at that time which helped him transiently but symptoms worsened again past Monday with shortness of breath on minimum exertion/cough and he noticed his pulse ox was dropping to 74% .  He started using 2 L O2 nasal cannula continuous.  On Tuesday, he called pulmonary office given persistent symptoms and also pulse oximetry showing O2 sats 84% on exertion and 93% at rest in spite of using continuous O2.  He was advised by Dr. Vaughan [name] to use 60 mg prednisone x3 days followed by 10 mg taper every 3 days.  He was also advised to use nebulizer treatments every 3 hours and continue Symbicort inhaler twice daily.  Patient was supposed to start 50 mg prednisone today but woke up feeling extremely short of breath.  He states he is also been having bad bouts of cough at night with wheezing and bronchospasm.   ?He presented to [place] ED where he was noted to be afebrile but hypoxic, required BiPAP for 3 hours, chest x-ray was negative for infiltrates.  Patient denies any recent history of travel or known sick contacts with flu positive or coronavirus positive patients.  He lives at home with his wife who is recovering  from lung cancer and completed chemotherapy 1 month back.  ? Hospitalist service was called to request direct admission to medical floor but ED to ED transfer facilitated for thorough clinical evaluation prior to requesting medical bed given coronavirus pandemic.  In the ED at [facility], patient has been saturating well on 3 L nasal cannula but appears tachypneic on talking full sentences.  He denies any chest pain, denies any fevers or leg swellings. ?  ?Hospital Course:  ?Active Problems: ?  COPD exacerbation ([place]) ?  PAF (paroxysmal atrial fibrillation) ([place]) ?  Acute on chronic respiratory failure with hypoxia (HCC) ?  ?  ?Acute on Hypoxic respiratory failure, COPD  exacerbation, Progression of severe COPD ?-Emphysema, with no evidence of superimposed acute cardiopulmonary disease ?-improving on iv steroids, oral zitrhomax, he finished total of 5 days of zithromax in the hospital, he is discharged on slow prednisone taper.  ?- o2 dropped to 86% while ambulating on room air, discharged on continuous home o2 ( previously on nighttime o2 only) ?-he is to follow with Dr Paul [name] ?  ? ?Paroxsymal afib, New diagnosis of afib ?-he is started on eliquis (CHADS-VASC 2, age and aortic atherosclerosis), cardizem, lasix ?-cardiology consulted recommend "Diltiazem CD 360. Avoid amiodarone and beta blockers given his underlying lung dz." ?-outpatient echo per cardiology  ? ?  ?Aortic and multivessel coronary atherosclerosis ? - Changed to high intensity statin, atorvastatin '40mg'$  PO QD. Check lipids and ALT as outpatient in 3 months.  ? - ASA 81. Aggressive secondary prevention. ? - No chest pain.    ? ? ? ? ? ?History of Present Illness  ?03/29/2019  Pulmonary/ extended post hosp f/u eval/Paul [name]  Re GOLD III copd ? 02 dep now ?Chief Complaint  ?Patient presents with  ? Hospitalization Follow-up  ?  Breathing has improved some. He has not used his albuterol inhaler or neb since hospital d/c.  ?Dyspnea:  mb and back ok off 02 sats upper 80s at end  ?Cough: none ?Sleep: recliner x 45 degrees = baseline 02 2lpm hs  ?SABA use: none  Since d/c on symb 160 2bid and spiriva each pm (technique 50% on both, see a/p) ?On pred 50 mg daily on day as ov ?rec ?Plan A = Automatic = symbicort 160 Take 2 puffs first thing in am and then another 2 puffs about 12 hours later.  ?Spiriva x 2 puffs  right after symbicort  ?Work on inhaler technique: ?Plan B = Backup ?Only use your albuterol inhaler(Proventil/Proair)  as a rescue medication ?Plan C = Crisis ?- only use your albuterol nebulizer if you first try Plan B and it fails to help > ok to use the nebulizer up to every 4 hours but if start needing it regularly call  for immediate appointment ?Plan D = Doctor ?- call me if B and C not adequate ?Plan E = ER ?- go to ER or call 911 if all else fails   ?02  2lpm at bedtime and adjust to keep it over 90% with activity  ? ? ?  ? ?05/29/2021  f/u ov/Paul [name] re:  GOLD III  breztri  And prednisone at 10 mg  X weeks s flare  ?Chief Complaint  ?Patient presents with  ? Follow-up  ?  Breathing is doing well overall. He uses his albuterol inhaler 4-5 x per wk. He rarely uses neb.   ? Dyspnea:  Walking 2.8 mph ,mostly flat  x 30 min s stopping / 02 sats on 2lpm  but will go  ?Cough: none  ?Sleeping: wedge 30 degrees ?SABA use: as above/ mostly hfa  ?02: 2lpm hs/ none needed at rest  ?Covid status:   X  vax 3  ?Rec ?Try prednisone 10 mg alternating with 5 mg days ?No change in any of your other medication  ?We can refer you now for a best fit for portable 02 concentrator  ?Rec  ?Try prednisone 10 mg alternating with 5 mg days ?No change in any of your other medication  ?We can refer you now for a best fit for portable 02 concentrator  ?  ? ? ? ?03/19/2022  f/u ov/Paul [name] re: GOLD 3  maint on Breztri and prednisone 12.5  mg daily  ?Chief Complaint  ?Patient presents with  ? Follow-up  ?  Pt states he has been doing okay since last visit and denies any complaints.  ? Dyspnea:  now back walking treadmill 10-15 min flat and slow  ?Cough: none  ?Sleeping: wedge x 30 degrees  ?SABA use: rare hfa and weaned down to 0  3 days prior  ?02: 2lpm hs and prn daytime ?Covid status:   vax max  ? ? ?No obvious day to day or daytime variability or assoc excess/ purulent sputum or mucus plugs or hemoptysis or cp or chest tightness, subjective wheeze or overt sinus or hb symptoms.  ? ?Sleeping as above without nocturnal  or early am exacerbation  of respiratory  c/o's or need for noct saba. Also denies any obvious fluctuation of symptoms with weather or environmental changes or other aggravating or alleviating factors except as outlined above  ? ?No unusual exposure hx  or h/o childhood pna/ asthma or knowledge of premature birth. ? ?Current Allergies, Complete Past Medical History, Past Surgical History, Family History, and Social History were reviewed in [employer]

## 2022-03-19 ENCOUNTER — Encounter: Payer: Self-pay | Admitting: Internal Medicine

## 2022-03-19 ENCOUNTER — Ambulatory Visit (INDEPENDENT_AMBULATORY_CARE_PROVIDER_SITE_OTHER): Payer: Medicare Other | Admitting: Internal Medicine

## 2022-03-19 ENCOUNTER — Other Ambulatory Visit: Payer: Self-pay

## 2022-03-19 DIAGNOSIS — J9611 Chronic respiratory failure with hypoxia: Secondary | ICD-10-CM | POA: Diagnosis not present

## 2022-03-19 DIAGNOSIS — J449 Chronic obstructive pulmonary disease, unspecified: Secondary | ICD-10-CM

## 2022-03-19 NOTE — Patient Instructions (Signed)
Plan A = Automatic = Always=    Breztri Take 2 puffs first thing in am and then another 2 puffs about 12 hours later.  ?  ?Work on inhaler technique:  relax and gently blow all the way out then take a nice smooth full deep breath back in, triggering the inhaler at same time you start breathing in.  Hold for up to 5 seconds if you can. Blow out thru nose. Rinse and gargle with water when done.  If mouth or throat bother you at all,  try brushing teeth/gums/tongue with arm and hammer toothpaste/ make a slurry and gargle and spit out.  ? ?   ?Plan B = Backup (to supplement plan A, not to replace it) ?Only use your albuterol inhaler as a rescue medication to be used if you can't catch your breath by resting or doing a relaxed purse lip breathing pattern.  ?- The less you use it, the better it will work when you need it. ?- Ok to use the inhaler up to 2 puffs  every 4 hours if you must but call for appointment if use goes up over your usual need ?- Don't leave home without it !!  (think of it like the spare tire for your car)  ? ?Plan C = Crisis (instead of Plan B but only if Plan B stops working) ?- only use your albuterol nebulizer if you first try Plan B and it fails to help > ok to use the nebulizer up to every 4 hours but if start needing it regularly call for immediate appointment ? ? ?Plan D = Deltasone = prednisone  ?- double prednisone dose until better then taper back to where your were  ? ?Plan E = ER ?- go to ER or call 911 if all else fails   ? ? ?Please schedule a follow up visit in 3 months but call sooner if needed  ?

## 2022-03-20 ENCOUNTER — Encounter: Payer: Self-pay | Admitting: Internal Medicine

## 2022-03-20 NOTE — Assessment & Plan Note (Signed)
Quit smoking 01/2009  ?02/04/12 Paul [name]: FEV1 36% FVC 57% FEF 25 75    11% ?-05/29/16  FEV1 0.83 L (31%) FEV1/FVC 0.40 FEF 25-75 0.30 L (14%) no bronchodilator response p ? rx prior, DLCO corrected 60% (hemoglobin 10.9) ?- 03/29/2019   continue symb 160 2bid and spiriva smi 2 puffs each am  ?- 04/15/2019 placed on daily pred with ceiling 40 and floor of 10 mg daily and empirical rx for GERD ?- 04/15/2019 flutter valve training  ?- alpha one screen 06/15/2019  MM  Level 178 ?- 06/15/2019  After extensive coaching inhaler device,  effectiveness =    90% > so try to taper pred off using ceiling of 20 if worse > 07/26/2019 changed to ceiling of 10 and floor of 5 mg  ?- 10/26/2019  After extensive coaching inhaler device,  effectiveness =    90% with elipta and still needing pred 20 mg daily so try trelegy sample > consider breztri if prefers hfa and insurance covers  ?- 12/27/2019 added ppi bid ac otc for atypical spells of sob  ?- 01/27/2020 changed lopressor to bisoprolol due to refractory wheeze/ sob on lopressor 100 mg daily ?- Sinus CT  02/03/20 1. Minimal paranasal sinus mucosal thickening, limited to a left ?ethmoid air cell. No convincing acute sinusitis. ?2. Symmetric but mild nasal cavity mucosal thickening, raising the ?possibility of mild rhinitis. ?CT chest 02/03/20 1. Coronary artery calcifications are noted suggesting coronary artery disease. ?2. Emphysematous disease is noted in the upper lobes bilaterally. ?3. Interval development of several old thoracic compression ?fractures since prior exam. No definite acute fracture is noted. ? - 02/24/20 trial of daliresp 250 > could not tol  ? - 02/19/2021  After extensive coaching inhaler device,  effectiveness =   80% > try change to breztri  ? ? Group D in terms of symptom/risk and laba/lama/ICS  therefore appropriate rx at this point >>>  Breztri/ approp saba and daily prednisone chronically  ? ?The goal with a chronic steroid dependent illness is always arriving at the lowest  effective dose that controls the disease/symptoms and not accepting a set "formula" which is based on statistics or guidelines that don't always take into account patient  variability or the natural hx of the dz in every individual patient, which may well vary over time.  For now therefore I recommend the patient maintain floor of 5 mg with the contingency to double the dose at the firs sign of increased in symptoms or need for saba and double again to max of 20 mg if that is not working.  ? ?Also discussed role anxiety plays in aecopd in that the more anxious the faster the breathing the more the air trapping and encouraged use of xanax prn for this. ? ?  ?

## 2022-03-20 NOTE — Assessment & Plan Note (Signed)
Hs 02 since around 2005 ?- referred for best fit 05/29/2021  ?ABG 02/2022- OP ABG -not during flare - ph 7.49. Pco2 40, HCO3 30, Po2 94, O2 sats 98% ? ? ?Advised: ?Make sure you check your oxygen saturation  AT  your highest level of activity (not after you stop)   to be sure it stays over 90% and adjust  02 flow upward to maintain this level if needed but remember to turn it back to previous settings when you stop (to conserve your supply).  ? ?    ?  ? ?Each maintenance medication was reviewed in detail including emphasizing most importantly the difference between maintenance and prns and under what circumstances the prns are to be triggered using an action plan format where appropriate. ? ?Total time for H and P, chart review, counseling, reviewing hfa/neb/02 device(s) and generating customized AVS unique to this office visit / same day charting  > 30 min  ?     ?

## 2022-03-22 DIAGNOSIS — J441 Chronic obstructive pulmonary disease with (acute) exacerbation: Secondary | ICD-10-CM

## 2022-03-22 DIAGNOSIS — I480 Paroxysmal atrial fibrillation: Secondary | ICD-10-CM | POA: Diagnosis not present

## 2022-03-22 DIAGNOSIS — I10 Essential (primary) hypertension: Secondary | ICD-10-CM | POA: Diagnosis not present

## 2022-03-22 DIAGNOSIS — E119 Type 2 diabetes mellitus without complications: Secondary | ICD-10-CM | POA: Diagnosis not present

## 2022-03-22 DIAGNOSIS — J449 Chronic obstructive pulmonary disease, unspecified: Secondary | ICD-10-CM | POA: Diagnosis not present

## 2022-03-22 DIAGNOSIS — E782 Mixed hyperlipidemia: Secondary | ICD-10-CM

## 2022-03-22 DIAGNOSIS — E1169 Type 2 diabetes mellitus with other specified complication: Secondary | ICD-10-CM

## 2022-03-27 ENCOUNTER — Telehealth: Payer: Self-pay

## 2022-03-27 NOTE — Progress Notes (Signed)
? ? ?Chronic Care Management ?Pharmacy Assistant  ? ?Name: Paul [name]  MRN: [id number] DOB: 1946-08-26 ? ? ?Reason for Encounter: Disease State call for HTN  ?  ?Recent office visits:  ?None ? ?Recent consult visits:  ?03/19/22 (Pulmonology) Paul Gully MD. Seen for COPD. Breztri Take 2 puffs first thing in am and then another 2 puffs about 12 hours later.  ? ?Hospital visits:  ?None ? ?Medications: ?Outpatient Encounter Medications as of 03/27/2022  ?Medication Sig Note  ? acetaminophen (TYLENOL) 325 MG tablet Take 650 mg by mouth every 6 (six) hours as needed for mild pain or fever.   ? albuterol (PROAIR HFA) 108 (90 Base) MCG/ACT inhaler Inhale 2 puffs into the lungs every 6 (six) hours as needed for wheezing or shortness of breath.    ? albuterol (PROVENTIL) (2.5 MG/3ML) 0.083% nebulizer solution Take 3 mLs (2.5 mg total) by nebulization every 6 (six) hours as needed for shortness of breath or wheezing.   ? ALPRAZolam (XANAX) 0.25 MG tablet Take 0.25 mg by mouth 2 (two) times daily as needed for anxiety. 03/02/2022: The patient some uses Alprazolam, and, other times, Lorazepam  ? Ascorbic Acid (VITAMIN C) 1000 MG tablet Take 1,000 mg by mouth daily.   ? aspirin EC 81 MG tablet Take 1 tablet (81 mg total) by mouth daily. Swallow whole.   ? atorvastatin (LIPITOR) 40 MG tablet Take 1 tablet (40 mg total) by mouth at bedtime.   ? bisoprolol (ZEBETA) 5 MG tablet Take 1 tablet (5 mg total) by mouth daily.   ? Budeson-Glycopyrrol-Formoterol (BREZTRI AEROSPHERE) 160-9-4.8 MCG/ACT AERO Inhale 2 puffs into the lungs 2 (two) times daily.   ? calcium carbonate (OS-CAL) 600 MG TABS tablet Take 600 mg by mouth daily.   ? Cholecalciferol (VITAMIN D3) 50 MCG (2000 UT) TABS Take 2,000 Units by mouth in the morning.   ? denosumab (PROLIA) 60 MG/ML SOSY injection Inject 60 mg into the skin every 6 (six) months.   ? diltiazem (CARDIZEM) 60 MG tablet Take 1 tablet (60 mg total) by mouth 4 (four) times daily as needed. (Patient  taking differently: Take 60 mg by mouth 4 (four) times daily as needed (for A-Fib).)   ? ferrous sulfate 325 (65 FE) MG tablet Take 325 mg by mouth 2 (two) times daily with a meal.   ? furosemide (LASIX) 40 MG tablet Take 1 tablet (40 mg total) by mouth daily. (Patient taking differently: Take 40 mg by mouth in the morning.)   ? guaiFENesin (MUCINEX) 600 MG 12 hr tablet Take 600 mg by mouth in the morning and at bedtime.   ? loratadine (CLARITIN) 10 MG tablet Take 10 mg by mouth in the morning.   ? LORazepam (ATIVAN) 0.5 MG tablet Take 0.5 mg by mouth daily as needed for anxiety. 03/02/2022: The patient some uses Alprazolam, and, other times, Lorazepam  ? Multiple Vitamin (MULTIVITAMIN WITH MINERALS) TABS tablet Take 1 tablet by mouth daily with breakfast.   ? Niacin (VITAMIN B-3 PO) Take 500 mg by mouth.   ? Omeprazole 20 MG TBDD Take 20 mg by mouth See admin instructions. Take 20 mg by mouth one to two times a day before meals   ? OXYGEN Inhale 2-3 L/min into the lungs See admin instructions. Inhale 2-3 L/min into the lungs at bedtime and when ambulatory   ? pantoprazole (PROTONIX) 40 MG tablet Take 1 tablet by mouth once daily   ? predniSONE (DELTASONE) 20 MG tablet Take 17  mg by mouth daily with breakfast.   ? Probiotic Product (PROBIOTIC DAILY) CAPS Take 1 capsule by mouth in the morning.   ? sertraline (ZOLOFT) 50 MG tablet Take 1 tablet (50 mg total) by mouth daily. (Patient taking differently: Take 50 mg by mouth at bedtime.)   ? tiZANidine (ZANAFLEX) 2 MG tablet Take 1 tablet (2 mg total) by mouth every 6 (six) hours as needed for muscle spasms.   ? ?No facility-administered encounter medications on file as of 03/27/2022.  ? ? ? ?Recent Office Vitals: ?BP Readings from Last 3 Encounters:  ?03/19/22 122/62  ?03/11/22 (!) 118/56  ?03/06/22 128/78  ? ?Pulse Readings from Last 3 Encounters:  ?03/19/22 80  ?03/11/22 89  ?03/06/22 72  ?  ?Wt Readings from Last 3 Encounters:  ?03/19/22 152 lb 3.2 oz (69 kg)   ?03/11/22 153 lb (69.4 kg)  ?03/06/22 148 lb (67.1 kg)  ?  ? ?Kidney Function ?Lab Results  ?Component Value Date/Time  ? CREATININE 0.80 03/11/2022 09:52 AM  ? CREATININE 0.90 03/06/2022 06:49 PM  ? GFRNONAA >60 03/06/2022 06:49 PM  ? GFRAA 107 11/20/2020 12:02 PM  ? ? ? ?  Latest Ref Rng & Units 03/11/2022  ?  9:52 AM 03/06/2022  ?  6:49 PM 03/03/2022  ?  5:25 AM  ?BMP  ?Glucose 70 - 99 mg/dL 114   168   149    ?BUN 8 - 27 mg/dL '11   17   22    '$ ?Creatinine 0.76 - 1.27 mg/dL 0.80   0.90   0.82    ?BUN/Creat Ratio 10 - 24 14      ?Sodium 134 - 144 mmol/L 145   139   139    ?Potassium 3.5 - 5.2 mmol/L 3.7   4.2   4.9    ?Chloride 96 - 106 mmol/L 99   100   97    ?CO2 20 - 29 mmol/L 29   29   32    ?Calcium 8.6 - 10.2 mg/dL 9.0   8.8   9.3    ? ? ? ?Current antihypertensive regimen:  ?Bisoprolol '5mg'$  daily  ?Patient verbally confirms he is taking the above medications as directed. Yes ? ?How often are you checking your Blood Pressure? daily ? ?he checks his blood pressure in the morning before taking his medication. ? ?Current home BP readings: 03/27/22 136/59, 03/26/22 125/58 ? ?Wrist or arm cuff:Arm  ?Caffeine intake:None  ?Salt intake:Limited  ?OTC medications including pseudoephedrine or NSAIDs? Tylenol prn  ? ?Any readings above 180/120? No ? ?What recent interventions/DTPs have been made by any provider to improve Blood Pressure control since last CPP Visit: Pt denies any changes  ? ?Any recent hospitalizations or ED visits since last visit with CPP? No ? ?What diet changes have been made to improve Blood Pressure Control?  ?Pt has not made any recent changes  ? ?What exercise is being done to improve your Blood Pressure Control?  ?Pt goes to the gym every day ? ?Adherence Review: ?Is the patient currently on ACE/ARB medication? Yes ?Does the patient have >5 day gap between last estimated fill dates? CPP to review ? ?Care Gaps: ?Last annual wellness visit?None noted  ? ?Star Rating Drugs:  ?Medication:  Last Fill: Day  Supply  ?None noted  ? ?[name], CMA ?Clinical Pharmacist Assistant  ?[phone]  ?

## 2022-03-28 DIAGNOSIS — Z20822 Contact with and (suspected) exposure to covid-19: Secondary | ICD-10-CM | POA: Diagnosis not present

## 2022-04-02 ENCOUNTER — Encounter: Payer: Self-pay | Admitting: Family Medicine

## 2022-04-08 ENCOUNTER — Ambulatory Visit (INDEPENDENT_AMBULATORY_CARE_PROVIDER_SITE_OTHER): Payer: Medicare Other | Admitting: Family Medicine

## 2022-04-08 ENCOUNTER — Encounter: Payer: Self-pay | Admitting: Family Medicine

## 2022-04-08 VITALS — BP 122/62 | HR 81 | Temp 98.0°F | Ht 66.0 in | Wt 156.0 lb

## 2022-04-08 DIAGNOSIS — R55 Syncope and collapse: Secondary | ICD-10-CM

## 2022-04-08 DIAGNOSIS — M8000XD Age-related osteoporosis with current pathological fracture, unspecified site, subsequent encounter for fracture with routine healing: Secondary | ICD-10-CM | POA: Diagnosis not present

## 2022-04-08 DIAGNOSIS — R42 Dizziness and giddiness: Secondary | ICD-10-CM | POA: Diagnosis not present

## 2022-04-08 DIAGNOSIS — I480 Paroxysmal atrial fibrillation: Secondary | ICD-10-CM

## 2022-04-08 HISTORY — DX: Dizziness and giddiness: R42

## 2022-04-08 MED ORDER — DENOSUMAB 60 MG/ML ~~LOC~~ SOSY
60.0000 mg | PREFILLED_SYRINGE | Freq: Once | SUBCUTANEOUS | Status: AC
  Administered 2022-04-08: 60 mg via SUBCUTANEOUS

## 2022-04-08 NOTE — Addendum Note (Signed)
Addended by: Thompson Caul I on: 04/08/2022 01:41 PM ? ? Modules accepted: Orders ? ?

## 2022-04-08 NOTE — Progress Notes (Signed)
? ?Acute Office Visit ? ?Subjective:  ? ? Patient ID: Paul [name], male    DOB: 01/30/1946, 76 y.o.   MRN: [id number] ? ?Chief Complaint  ?Patient presents with  ? Syncope & Collapse  ? ? ?HPI ?Patient is in today for passing out. Patient often feels dizzy when stands and will sometimes even sit back down and try again in a little while. If he has been laying/sitting for long periods of time the dizziness is worse. If he holds on for about a minute he will not fall. Patient is having episodes nearly every day and has had 2 falls per week for 2 months. Patient has not felt fully better since his last hospitalization in mid February 2023.  ?BP 115-140/65-80s ? ?Patient does not feel like this when he gets up from bed when has oxygen on. Patient does not wear oxygen when sitting in chair. He did fall 6 weeks ago when he had his oxygen on, so this is a little confusing.  ? ?Past Medical History:  ?Diagnosis Date  ? Anemia   ? Aortic atherosclerosis ([place])   ? Aspiration pneumonia due to inhalation of vomitus ([place]) 02/03/2022  ? Atrial fibrillation ([place])   ? Back pain   ? COPD (chronic obstructive pulmonary disease) ([place])   ? COPD exacerbation ([place]) 12/27/2021  ? COVID-19 12/24/2020  ? Diabetes mellitus without complication ([place])   ? Elevated PSA   ? GAD (generalized anxiety disorder)   ? GERD (gastroesophageal reflux disease)   ? High cholesterol   ? Hypertension   ? Lingular pneumonia 01/05/2021  ? See cxr 01/06/20 rx with zpak/ cefipime x 5 days and then developed purulent sputum again 01/12/21 so rx levcaquin 500 mg daily x 7 days then return for cxr before more   ? Lung nodule < 6cm on CT 05/29/2016  ? Osteoporosis   ? Oxygen deficiency   ? Pneumonia   ? January 2022  ? 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 s/p LAAO by Dr. Quentin Ore with a 20 mm Watchman FLX device  ? RSV bronchitis 01/27/2022  ? Vertebral compression fracture ([place]) 05/29/2019  ? T8 compression fracture noted on CT scan 05/28/2019  inpatient with chronic steroid dependent COPD - rx calcitonin nasal spray rx per PCP   ? ? ?Past Surgical History:  ?Procedure Laterality Date  ? CATARACT EXTRACTION Right   ? IR KYPHO EA ADDL LEVEL THORACIC OR LUMBAR  11/28/2021  ? LEFT ATRIAL APPENDAGE OCCLUSION N/A 08/02/2021  ? Procedure: LEFT ATRIAL APPENDAGE OCCLUSION;  Surgeon: Sherren Mocha, MD;  Location: [facility];  Service: Cardiovascular;  Laterality: N/A;  ? SKIN SURGERY    ? shoulders and chest  ? TEE WITHOUT CARDIOVERSION N/A 08/02/2021  ? Procedure: TRANSESOPHAGEAL ECHOCARDIOGRAM (TEE);  Surgeon: Sherren Mocha, MD;  Location: [facility];  Service: Cardiovascular;  Laterality: N/A;  ? TEE WITHOUT CARDIOVERSION N/A 09/13/2021  ? Procedure: TRANSESOPHAGEAL ECHOCARDIOGRAM (TEE);  Surgeon: Sanda Klein, MD;  Location: [facility];  Service: Cardiovascular;  Laterality: N/A;  ? ? ?Family History  ?Problem Relation Age of Onset  ? Heart disease Brother   ? Heart disease Mother   ? Heart disease Father   ? Cancer Paternal Uncle   ? ? ?Social History  ? ?Socioeconomic History  ? Marital status: Married  ?  Spouse name: Not on file  ? Number of children: 3  ? Years of education: Not on file  ? Highest  education level: Not on file  ?Occupational History  ? Occupation: retired  ?  Comment: truck driver  ?Tobacco Use  ? Smoking status: Former  ?  Packs/day: 2.00  ?  Years: 52.00  ?  Pack years: 104.00  ?  Types: Cigarettes  ?  Quit date: 02/03/2009  ?  Years since quitting: 13.2  ? Smokeless tobacco: Never  ? Tobacco comments:  ?  Counseled to remain smoke free  ?Vaping Use  ? Vaping Use: Never used  ?Substance and Sexual Activity  ? Alcohol use: Not Currently  ?  Alcohol/week: 0.0 standard drinks  ?  Comment: occ  ? Drug use: No  ? Sexual activity: Not on file  ?Other Topics Concern  ? Not on file  ?Social History Narrative  ? Originally from [facility]. Previously worked driving a Restaurant manager, fast food. No pets currently. No mold exposure. 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   ?   ? wears sunscreen, brushes and flosses daily, see's dentist bi-annually, has smoke/carbon monoxide detectors, wears a seatbelt and practices gun safety  ?   ? ?Social Determinants of Health  ? ?Financial Resource Strain: Medium Risk  ? Difficulty of Paying Living Expenses: Somewhat hard  ?Food Insecurity: No Food Insecurity  ? Worried About Charity fundraiser in the Last Year: Never true  ? Ran Out of Food in the Last Year: Never true  ?Transportation Needs: No Transportation Needs  ? Lack of Transportation (Medical): No  ? Lack of Transportation (Non-Medical): No  ?Physical Activity: Insufficiently Active  ? Days of Exercise per Week: 7 days  ? Minutes of Exercise per Session: 10 min  ?Stress: No Stress Concern Present  ? Feeling of Stress : Only a little  ?Social Connections: Socially Integrated  ? Frequency of Communication with Friends and Family: More than three times a week  ? Frequency of Social Gatherings with Friends and Family: More than three times a week  ? Attends Religious Services: More than 4 times per year  ? Active Member of Clubs or Organizations: Yes  ? Attends Archivist Meetings: More than 4 times per year  ? Marital Status: Married  ?Intimate Partner Violence: Not At Risk  ? Fear of Current or Ex-Partner: No  ? Emotionally Abused: No  ? Physically Abused: No  ? Sexually Abused: No  ? ? ?Outpatient Medications Prior to Visit  ?Medication Sig Dispense Refill  ? acetaminophen (TYLENOL) 325 MG tablet Take 650 mg by mouth every 6 (six) hours as needed for mild pain or fever.    ? albuterol (PROAIR HFA) 108 (90 Base) MCG/ACT inhaler Inhale 2 puffs into the lungs every 6 (six) hours as needed for wheezing or shortness of breath.     ? albuterol (PROVENTIL) (2.5 MG/3ML) 0.083% nebulizer solution Take 3 mLs (2.5 mg total) by nebulization every 6 (six) hours as needed for shortness of breath or  wheezing. 360 mL 5  ? ALPRAZolam (XANAX) 0.25 MG tablet Take 0.25 mg by mouth 2 (two) times daily as needed for anxiety.    ? Ascorbic Acid (VITAMIN C) 1000 MG tablet Take 1,000 mg by mouth daily.    ? aspirin EC 81 MG tablet Take 1 tablet (81 mg total) by mouth daily. Swallow whole. 90 tablet 3  ? atorvastatin (LIPITOR) 40 MG tablet Take 1 tablet (40 mg total) by mouth at bedtime. 90 tablet 0  ?  bisoprolol (ZEBETA) 5 MG tablet Take 1 tablet (5 mg total) by mouth daily. 90 tablet 3  ? Budeson-Glycopyrrol-Formoterol (BREZTRI AEROSPHERE) 160-9-4.8 MCG/ACT AERO Inhale 2 puffs into the lungs 2 (two) times daily. 32.1 g 3  ? calcium carbonate (OS-CAL) 600 MG TABS tablet Take 600 mg by mouth daily.    ? Cholecalciferol (VITAMIN D3) 50 MCG (2000 UT) TABS Take 2,000 Units by mouth in the morning.    ? denosumab (PROLIA) 60 MG/ML SOSY injection Inject 60 mg into the skin every 6 (six) months.    ? diltiazem (CARDIZEM) 60 MG tablet Take 1 tablet (60 mg total) by mouth 4 (four) times daily as needed. (Patient taking differently: Take 60 mg by mouth 4 (four) times daily as needed (for A-Fib).) 30 tablet prn  ? ferrous sulfate 325 (65 FE) MG tablet Take 325 mg by mouth 2 (two) times daily with a meal.    ? furosemide (LASIX) 40 MG tablet Take 1 tablet (40 mg total) by mouth daily. (Patient taking differently: Take 40 mg by mouth in the morning.) 90 tablet 3  ? guaiFENesin (MUCINEX) 600 MG 12 hr tablet Take 600 mg by mouth in the morning and at bedtime.    ? loratadine (CLARITIN) 10 MG tablet Take 10 mg by mouth in the morning.    ? LORazepam (ATIVAN) 0.5 MG tablet Take 0.5 mg by mouth daily as needed for anxiety.    ? Multiple Vitamin (MULTIVITAMIN WITH MINERALS) TABS tablet Take 1 tablet by mouth daily with breakfast.    ? Niacin (VITAMIN B-3 PO) Take 500 mg by mouth.    ? Omeprazole 20 MG TBDD Take 20 mg by mouth See admin instructions. Take 20 mg by mouth one to two times a day before meals (Patient not taking: Reported on  04/24/2022)    ? OXYGEN Inhale 2-3 L/min into the lungs See admin instructions. Inhale 2-3 L/min into the lungs at bedtime and when ambulatory    ? pantoprazole (PROTONIX) 40 MG tablet Take 1 tablet by mouth on

## 2022-04-09 ENCOUNTER — Other Ambulatory Visit: Payer: Self-pay

## 2022-04-09 ENCOUNTER — Ambulatory Visit (INDEPENDENT_AMBULATORY_CARE_PROVIDER_SITE_OTHER): Payer: Medicare Other

## 2022-04-09 ENCOUNTER — Telehealth: Payer: Self-pay | Admitting: Cardiology

## 2022-04-09 DIAGNOSIS — R42 Dizziness and giddiness: Secondary | ICD-10-CM

## 2022-04-09 DIAGNOSIS — I480 Paroxysmal atrial fibrillation: Secondary | ICD-10-CM

## 2022-04-09 LAB — CBC WITH DIFFERENTIAL/PLATELET
Basophils Absolute: 0 10*3/uL (ref 0.0–0.2)
Basos: 0 %
EOS (ABSOLUTE): 0 10*3/uL (ref 0.0–0.4)
Eos: 0 %
Hematocrit: 37.7 % (ref 37.5–51.0)
Hemoglobin: 11.9 g/dL — ABNORMAL LOW (ref 13.0–17.7)
Immature Grans (Abs): 0.1 10*3/uL (ref 0.0–0.1)
Immature Granulocytes: 1 %
Lymphocytes Absolute: 1.7 10*3/uL (ref 0.7–3.1)
Lymphs: 11 %
MCH: 25.9 pg — ABNORMAL LOW (ref 26.6–33.0)
MCHC: 31.6 g/dL (ref 31.5–35.7)
MCV: 82 fL (ref 79–97)
Monocytes Absolute: 0.6 10*3/uL (ref 0.1–0.9)
Monocytes: 4 %
Neutrophils Absolute: 12.7 10*3/uL — ABNORMAL HIGH (ref 1.4–7.0)
Neutrophils: 84 %
Platelets: 316 10*3/uL (ref 150–450)
RBC: 4.6 x10E6/uL (ref 4.14–5.80)
RDW: 14.5 % (ref 11.6–15.4)
WBC: 15.2 10*3/uL — ABNORMAL HIGH (ref 3.4–10.8)

## 2022-04-09 LAB — COMPREHENSIVE METABOLIC PANEL
ALT: 25 IU/L (ref 0–44)
AST: 23 IU/L (ref 0–40)
Albumin/Globulin Ratio: 1.7 (ref 1.2–2.2)
Albumin: 4.1 g/dL (ref 3.7–4.7)
Alkaline Phosphatase: 96 IU/L (ref 44–121)
BUN/Creatinine Ratio: 20 (ref 10–24)
BUN: 19 mg/dL (ref 8–27)
Bilirubin Total: 0.2 mg/dL (ref 0.0–1.2)
CO2: 28 mmol/L (ref 20–29)
Calcium: 9.8 mg/dL (ref 8.6–10.2)
Chloride: 99 mmol/L (ref 96–106)
Creatinine, Ser: 0.93 mg/dL (ref 0.76–1.27)
Globulin, Total: 2.4 g/dL (ref 1.5–4.5)
Glucose: 131 mg/dL — ABNORMAL HIGH (ref 70–99)
Potassium: 4.9 mmol/L (ref 3.5–5.2)
Sodium: 145 mmol/L — ABNORMAL HIGH (ref 134–144)
Total Protein: 6.5 g/dL (ref 6.0–8.5)
eGFR: 85 mL/min/{1.73_m2} (ref >59)

## 2022-04-09 MED ORDER — AMOXICILLIN-POT CLAVULANATE 875-125 MG PO TABS: 1.0000 | ORAL_TABLET | Freq: Two times a day (BID) | ORAL | 0 refills | Status: DC

## 2022-04-09 NOTE — Telephone Encounter (Signed)
Please order ZIO 14 day monitor XT ?- AFIB, dizziness ? ?Thanks ?

## 2022-04-09 NOTE — Progress Notes (Unsigned)
Enrolled for [employer] to mail a ZIO XT long term holter monitor to the patients address on file.  

## 2022-04-11 DIAGNOSIS — I480 Paroxysmal atrial fibrillation: Secondary | ICD-10-CM | POA: Diagnosis not present

## 2022-04-11 DIAGNOSIS — R42 Dizziness and giddiness: Secondary | ICD-10-CM | POA: Diagnosis not present

## 2022-04-21 DIAGNOSIS — E119 Type 2 diabetes mellitus without complications: Secondary | ICD-10-CM | POA: Diagnosis not present

## 2022-04-21 DIAGNOSIS — I10 Essential (primary) hypertension: Secondary | ICD-10-CM | POA: Diagnosis not present

## 2022-04-21 DIAGNOSIS — J449 Chronic obstructive pulmonary disease, unspecified: Secondary | ICD-10-CM | POA: Diagnosis not present

## 2022-04-21 DIAGNOSIS — I480 Paroxysmal atrial fibrillation: Secondary | ICD-10-CM | POA: Diagnosis not present

## 2022-04-24 ENCOUNTER — Telehealth: Payer: Self-pay

## 2022-04-24 ENCOUNTER — Telehealth (INDEPENDENT_AMBULATORY_CARE_PROVIDER_SITE_OTHER): Payer: Medicare Other

## 2022-04-24 DIAGNOSIS — R42 Dizziness and giddiness: Secondary | ICD-10-CM | POA: Diagnosis not present

## 2022-04-24 NOTE — Progress Notes (Signed)
? ? ?Chronic Care Management ?Pharmacy Assistant  ? ?Name: Paul [name]  MRN: [id number] DOB: 05-16-1946 ? ? ?Reason for Encounter: Disease State call for HTN ? ?Recent office visits:  ?04/08/22 Paul Brome MD. Seen for Dizziness. Recommend augmentin rx ? ?Recent consult visits:  ?None ? ?Hospital visits:  ?None ? ?Medications: ?Outpatient Encounter Medications as of 04/24/2022  ?Medication Sig Note  ? acetaminophen (TYLENOL) 325 MG tablet Take 650 mg by mouth every 6 (six) hours as needed for mild pain or fever.   ? albuterol (PROAIR HFA) 108 (90 Base) MCG/ACT inhaler Inhale 2 puffs into the lungs every 6 (six) hours as needed for wheezing or shortness of breath.    ? albuterol (PROVENTIL) (2.5 MG/3ML) 0.083% nebulizer solution Take 3 mLs (2.5 mg total) by nebulization every 6 (six) hours as needed for shortness of breath or wheezing.   ? ALPRAZolam (XANAX) 0.25 MG tablet Take 0.25 mg by mouth 2 (two) times daily as needed for anxiety. 03/02/2022: The patient some uses Alprazolam, and, other times, Lorazepam  ? amoxicillin-clavulanate (AUGMENTIN) 875-125 MG tablet Take 1 tablet by mouth 2 (two) times daily.   ? Ascorbic Acid (VITAMIN C) 1000 MG tablet Take 1,000 mg by mouth daily.   ? aspirin EC 81 MG tablet Take 1 tablet (81 mg total) by mouth daily. Swallow whole.   ? atorvastatin (LIPITOR) 40 MG tablet Take 1 tablet (40 mg total) by mouth at bedtime.   ? bisoprolol (ZEBETA) 5 MG tablet Take 1 tablet (5 mg total) by mouth daily.   ? Budeson-Glycopyrrol-Formoterol (BREZTRI AEROSPHERE) 160-9-4.8 MCG/ACT AERO Inhale 2 puffs into the lungs 2 (two) times daily.   ? calcium carbonate (OS-CAL) 600 MG TABS tablet Take 600 mg by mouth daily.   ? Cholecalciferol (VITAMIN D3) 50 MCG (2000 UT) TABS Take 2,000 Units by mouth in the morning.   ? denosumab (PROLIA) 60 MG/ML SOSY injection Inject 60 mg into the skin every 6 (six) months.   ? diltiazem (CARDIZEM) 60 MG tablet Take 1 tablet (60 mg total) by mouth 4 (four) times daily  as needed. (Patient taking differently: Take 60 mg by mouth 4 (four) times daily as needed (for A-Fib).)   ? ferrous sulfate 325 (65 FE) MG tablet Take 325 mg by mouth 2 (two) times daily with a meal.   ? furosemide (LASIX) 40 MG tablet Take 1 tablet (40 mg total) by mouth daily. (Patient taking differently: Take 40 mg by mouth in the morning.)   ? guaiFENesin (MUCINEX) 600 MG 12 hr tablet Take 600 mg by mouth in the morning and at bedtime.   ? loratadine (CLARITIN) 10 MG tablet Take 10 mg by mouth in the morning.   ? LORazepam (ATIVAN) 0.5 MG tablet Take 0.5 mg by mouth daily as needed for anxiety. 03/02/2022: The patient some uses Alprazolam, and, other times, Lorazepam  ? Multiple Vitamin (MULTIVITAMIN WITH MINERALS) TABS tablet Take 1 tablet by mouth daily with breakfast.   ? Niacin (VITAMIN B-3 PO) Take 500 mg by mouth.   ? Omeprazole 20 MG TBDD Take 20 mg by mouth See admin instructions. Take 20 mg by mouth one to two times a day before meals (Patient not taking: Reported on 04/24/2022)   ? OXYGEN Inhale 2-3 L/min into the lungs See admin instructions. Inhale 2-3 L/min into the lungs at bedtime and when ambulatory   ? pantoprazole (PROTONIX) 40 MG tablet Take 1 tablet by mouth once daily (Patient not taking: Reported on  04/24/2022)   ? predniSONE (DELTASONE) 20 MG tablet Take 17 mg by mouth daily with breakfast.   ? Probiotic Product (PROBIOTIC DAILY) CAPS Take 1 capsule by mouth in the morning.   ? sertraline (ZOLOFT) 50 MG tablet Take 1 tablet (50 mg total) by mouth daily. (Patient taking differently: Take 50 mg by mouth at bedtime.)   ? tiZANidine (ZANAFLEX) 2 MG tablet Take 1 tablet (2 mg total) by mouth every 6 (six) hours as needed for muscle spasms.   ? ?No facility-administered encounter medications on file as of 04/24/2022.  ? ? ? ?Recent Office Vitals: ?BP Readings from Last 3 Encounters:  ?04/08/22 122/62  ?03/19/22 122/62  ?03/11/22 (!) 118/56  ? ?Pulse Readings from Last 3 Encounters:  ?04/08/22 81   ?03/19/22 80  ?03/11/22 89  ?  ?Wt Readings from Last 3 Encounters:  ?04/08/22 156 lb (70.8 kg)  ?03/19/22 152 lb 3.2 oz (69 kg)  ?03/11/22 153 lb (69.4 kg)  ?  ? ?Kidney Function ?Lab Results  ?Component Value Date/Time  ? CREATININE 0.93 04/08/2022 02:21 PM  ? CREATININE 0.80 03/11/2022 09:52 AM  ? GFRNONAA >60 03/06/2022 06:49 PM  ? GFRAA 107 11/20/2020 12:02 PM  ? ? ? ?  Latest Ref Rng & Units 04/08/2022  ?  2:21 PM 03/11/2022  ?  9:52 AM 03/06/2022  ?  6:49 PM  ?BMP  ?Glucose 70 - 99 mg/dL 131   114   168    ?BUN 8 - 27 mg/dL '19   11   17    '$ ?Creatinine 0.76 - 1.27 mg/dL 0.93   0.80   0.90    ?BUN/Creat Ratio 10 - '24 20   14     '$ ?Sodium 134 - 144 mmol/L 145   145   139    ?Potassium 3.5 - 5.2 mmol/L 4.9   3.7   4.2    ?Chloride 96 - 106 mmol/L 99   99   100    ?CO2 20 - 29 mmol/L '28   29   29    '$ ?Calcium 8.6 - 10.2 mg/dL 9.8   9.0   8.8    ? ? ? ?Current antihypertensive regimen:  ?Bisoprolol '5mg'$  daily  ?Furosemide '40mg'$  daily  ?Patient verbally confirms he is taking the above medications as directed. Yes ? ?How often are you checking your Blood Pressure? daily ? ?he checks his blood pressure in the morning before taking his medication. ? ?Current home BP readings: 04/24/22 120/73, 04/23/22 133/65, 04/22/22 132/77  ? ?Wrist or arm cuff:Arm  ?Caffeine intake:None  ?Salt intake:Limited  ?OTC medications including pseudoephedrine or NSAIDs? Tylenol prn  ?  ?Any readings above 180/120? No ? ?What recent interventions/DTPs have been made by any provider to improve Blood Pressure control since last CPP Visit:  ? ?Any recent hospitalizations or ED visits since last visit with CPP? No ? ?What diet changes have been made to improve Blood Pressure Control?  ?Pt denies any changes ? ?What exercise is being done to improve your Blood Pressure Control?  ?Pt goes to the gym every day but this week he has not been due to having a heart monitor on due to his syncope. He gets this taking off on Thursday and will have it read. Pt stated  this has been going on a few weeks. Pt stated he does get dizzy every now and then but it has never lasted this long. Pt gets dizzy when he goes from a sitting position  to a standing position and passes out for a few seconds. This has not happened in a few weeks.Pt denies chest pain and SOB. Pt stated his appetite has increased and stated he has gained some weight. On 03/31/22 he was 154lbs and now he is 157lbs. Pt denies any other concerns or questions.He takes lasix that helps keep his fluid retention down.  ? ?Adherence Review: ?Is the patient currently on ACE/ARB medication? Yes ?Does the patient have >5 day gap between last estimated fill dates? CPP to review ? ?Care Gaps: ?Last annual wellness visit?None noted ? ?Star Rating Drugs:  ?Medication:  Last Fill: Day Supply ?None noted ? ?[name], CMA ?Clinical Pharmacist Assistant  ?[phone]  ?

## 2022-04-24 NOTE — Telephone Encounter (Signed)
Patient confirmed he is experiencing no issues with lack of PPI ?

## 2022-04-25 DIAGNOSIS — R55 Syncope and collapse: Secondary | ICD-10-CM

## 2022-04-25 DIAGNOSIS — R42 Dizziness and giddiness: Secondary | ICD-10-CM

## 2022-04-25 NOTE — Telephone Encounter (Signed)
Let cardio know about issues ?

## 2022-04-26 ENCOUNTER — Encounter: Payer: Self-pay | Admitting: Cardiology

## 2022-04-26 NOTE — Addendum Note (Signed)
Addended by: Jerline Pain on: 04/26/2022 06:50 AM ? ? Modules accepted: Level of Service ? ?

## 2022-04-26 NOTE — Telephone Encounter (Signed)
Please see the MyChart message reply(ies) for my assessment and plan.    This patient gave consent for this Medical Advice Message and is aware that it may result in a bill to their insurance company, as well as the possibility of receiving a bill for a co-payment or deductible. They are an established patient, but are not seeking medical advice exclusively about a problem treated during an in person or video visit in the last seven days. I did not recommend an in person or video visit within seven days of my reply.    I spent a total of 11 minutes cumulative time within 7 days through MyChart messaging.  Damariz Paganelli, MD   

## 2022-04-29 DIAGNOSIS — Z20822 Contact with and (suspected) exposure to covid-19: Secondary | ICD-10-CM | POA: Diagnosis not present

## 2022-04-29 NOTE — Assessment & Plan Note (Signed)
Check ekg. ?

## 2022-04-29 NOTE — Assessment & Plan Note (Signed)
holtor monitor. ?Check labs ?

## 2022-04-30 DIAGNOSIS — Z20822 Contact with and (suspected) exposure to covid-19: Secondary | ICD-10-CM | POA: Diagnosis not present

## 2022-05-02 DIAGNOSIS — R42 Dizziness and giddiness: Secondary | ICD-10-CM | POA: Diagnosis not present

## 2022-05-02 DIAGNOSIS — I480 Paroxysmal atrial fibrillation: Secondary | ICD-10-CM | POA: Diagnosis not present

## 2022-05-09 ENCOUNTER — Telehealth: Payer: Self-pay

## 2022-05-09 NOTE — Chronic Care Management (AMB) (Signed)
Chronic Care Management Pharmacy Assistant   Name: Paul [name]  MRN: [id number] DOB: 1946/03/25  Reason for Encounter: [place] Benefit Verification   Patient receives Prolia injections for his Osteoporosis, last injection was on 09/06/2021, patient is overdue for next injecton. Submitted request for benefits through the [employer] Portal.  Medications: Outpatient Encounter Medications as of 05/09/2022  Medication Sig Note   acetaminophen (TYLENOL) 325 MG tablet Take 650 mg by mouth every 6 (six) hours as needed for mild pain or fever.    albuterol (PROAIR HFA) 108 (90 Base) MCG/ACT inhaler Inhale 2 puffs into the lungs every 6 (six) hours as needed for wheezing or shortness of breath.     albuterol (PROVENTIL) (2.5 MG/3ML) 0.083% nebulizer solution Take 3 mLs (2.5 mg total) by nebulization every 6 (six) hours as needed for shortness of breath or wheezing.    ALPRAZolam (XANAX) 0.25 MG tablet Take 0.25 mg by mouth 2 (two) times daily as needed for anxiety. 03/02/2022: The patient some uses Alprazolam, and, other times, Lorazepam   amoxicillin-clavulanate (AUGMENTIN) 875-125 MG tablet Take 1 tablet by mouth 2 (two) times daily.    Ascorbic Acid (VITAMIN C) 1000 MG tablet Take 1,000 mg by mouth daily.    aspirin EC 81 MG tablet Take 1 tablet (81 mg total) by mouth daily. Swallow whole.    atorvastatin (LIPITOR) 40 MG tablet Take 1 tablet (40 mg total) by mouth at bedtime.    bisoprolol (ZEBETA) 5 MG tablet Take 1 tablet (5 mg total) by mouth daily.    Budeson-Glycopyrrol-Formoterol (BREZTRI AEROSPHERE) 160-9-4.8 MCG/ACT AERO Inhale 2 puffs into the lungs 2 (two) times daily.    calcium carbonate (OS-CAL) 600 MG TABS tablet Take 600 mg by mouth daily.    Cholecalciferol (VITAMIN D3) 50 MCG (2000 UT) TABS Take 2,000 Units by mouth in the morning.    denosumab (PROLIA) 60 MG/ML SOSY injection Inject 60 mg into the skin every 6 (six) months.    diltiazem (CARDIZEM) 60 MG tablet Take 1 tablet (60 mg  total) by mouth 4 (four) times daily as needed. (Patient taking differently: Take 60 mg by mouth 4 (four) times daily as needed (for A-Fib).)    ferrous sulfate 325 (65 FE) MG tablet Take 325 mg by mouth 2 (two) times daily with a meal.    furosemide (LASIX) 40 MG tablet Take 1 tablet (40 mg total) by mouth daily. (Patient taking differently: Take 40 mg by mouth in the morning.)    guaiFENesin (MUCINEX) 600 MG 12 hr tablet Take 600 mg by mouth in the morning and at bedtime.    loratadine (CLARITIN) 10 MG tablet Take 10 mg by mouth in the morning.    LORazepam (ATIVAN) 0.5 MG tablet Take 0.5 mg by mouth daily as needed for anxiety. 03/02/2022: The patient some uses Alprazolam, and, other times, Lorazepam   Multiple Vitamin (MULTIVITAMIN WITH MINERALS) TABS tablet Take 1 tablet by mouth daily with breakfast.    Niacin (VITAMIN B-3 PO) Take 500 mg by mouth.    Omeprazole 20 MG TBDD Take 20 mg by mouth See admin instructions. Take 20 mg by mouth one to two times a day before meals (Patient not taking: Reported on 04/24/2022)    OXYGEN Inhale 2-3 L/min into the lungs See admin instructions. Inhale 2-3 L/min into the lungs at bedtime and when ambulatory    pantoprazole (PROTONIX) 40 MG tablet Take 1 tablet by mouth once daily (Patient not taking: Reported on 04/24/2022)  predniSONE (DELTASONE) 20 MG tablet Take 17 mg by mouth daily with breakfast.    Probiotic Product (PROBIOTIC DAILY) CAPS Take 1 capsule by mouth in the morning.    sertraline (ZOLOFT) 50 MG tablet Take 1 tablet (50 mg total) by mouth daily. (Patient taking differently: Take 50 mg by mouth at bedtime.)    tiZANidine (ZANAFLEX) 2 MG tablet Take 1 tablet (2 mg total) by mouth every 6 (six) hours as needed for muscle spasms.    No facility-administered encounter medications on file as of 05/09/2022.   Paul [name], [place] Pharmacist Assistant [phone]

## 2022-05-13 ENCOUNTER — Ambulatory Visit (INDEPENDENT_AMBULATORY_CARE_PROVIDER_SITE_OTHER): Payer: Medicare Other | Admitting: Family Medicine

## 2022-05-13 VITALS — BP 120/60 | HR 80 | Temp 98.7°F | Resp 14 | Ht 66.0 in | Wt 156.0 lb

## 2022-05-13 DIAGNOSIS — M7021 Olecranon bursitis, right elbow: Secondary | ICD-10-CM

## 2022-05-13 NOTE — Progress Notes (Unsigned)
Acute Office Visit  Subjective:    Patient ID: Paul [name], male    DOB: [birth year], 76 y.o.   MRN: [id number]  No chief complaint on file.   HPI Patient is in today for ***  Past Medical History:  Diagnosis Date   Anemia    Aortic atherosclerosis ([place])    Aspiration pneumonia due to inhalation of vomitus ([place]) 02/03/2022   Atrial fibrillation (HCC)    Back pain    COPD (chronic obstructive pulmonary disease) (HCC)    COPD exacerbation ([place]) 12/27/2021   COVID-19 12/24/2020   Diabetes mellitus without complication (HCC)    Elevated PSA    GAD (generalized anxiety disorder)    GERD (gastroesophageal reflux disease)    High cholesterol    Hypertension    Lingular pneumonia 01/05/2021   See cxr 01/06/20 rx with zpak/ cefipime x 5 days and then developed purulent sputum again 01/12/21 so rx levcaquin 500 mg daily x 7 days then return for cxr before more    Lung nodule < 6cm on CT 05/29/2016   Osteoporosis    Oxygen deficiency    Pneumonia    January 2022   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 Quentin Ore with a 20 mm Watchman FLX device   RSV bronchitis 01/27/2022   Vertebral compression fracture ([place]) 05/29/2019   T8 compression fracture noted on CT scan 05/28/2019 inpatient with chronic steroid dependent COPD - rx calcitonin nasal spray rx per PCP     Past Surgical History:  Procedure Laterality Date   CATARACT EXTRACTION Right    IR KYPHO EA ADDL LEVEL THORACIC OR LUMBAR  11/28/2021   LEFT ATRIAL APPENDAGE OCCLUSION N/A 08/02/2021   Procedure: LEFT ATRIAL APPENDAGE OCCLUSION;  Surgeon: Sherren Mocha, MD;  Location: [facility];  Service: Cardiovascular;  Laterality: N/A;   SKIN SURGERY     shoulders and chest   TEE WITHOUT CARDIOVERSION N/A 08/02/2021   Procedure: TRANSESOPHAGEAL ECHOCARDIOGRAM (TEE);  Surgeon: Sherren Mocha, MD;  Location: [facility];  Service: Cardiovascular;  Laterality: N/A;   TEE WITHOUT  CARDIOVERSION N/A 09/13/2021   Procedure: TRANSESOPHAGEAL ECHOCARDIOGRAM (TEE);  Surgeon: Sanda Klein, MD;  Location: [facility] ENDOSCOPY;  Service: Cardiovascular;  Laterality: N/A;    Family History  Problem Relation Age of Onset   Heart disease Brother    Heart disease Mother    Heart disease Father    Cancer Paternal Uncle     Social History   Socioeconomic History   Marital status: Married    Spouse name: Not on file   Number of children: 3   Years of education: Not on file   Highest education level: Not on file  Occupational History   Occupation: retired    Comment: truck driver  Tobacco Use   Smoking status: Former    Packs/day: 2.00    Years: 52.00    Pack years: 104.00    Types: Cigarettes    Quit date: 02/03/2009    Years since quitting: 13.2   Smokeless tobacco: Never   Tobacco comments:    Counseled to remain smoke free  Vaping Use   Vaping Use: Never used  Substance and Sexual Activity   Alcohol use: Not Currently    Alcohol/week: 0.0 standard drinks    Comment: occ   Drug use: No   Sexual activity: Not on file  Other Topics Concern   Not on file  Social History  Narrative   Originally from [place]. Previously worked driving a Restaurant manager, fast food. No pets currently. No mold exposure. 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       wears sunscreen, brushes and flosses daily, see's dentist bi-annually, has smoke/carbon monoxide detectors, wears a seatbelt and practices gun safety      Social Determinants of Health   Financial Resource Strain: Medium Risk   Difficulty of Paying Living Expenses: Somewhat hard  Food Insecurity: No Food Insecurity   Worried About Charity fundraiser in the Last Year: Never true   Ran Out of Food in the Last Year: Never true  Transportation Needs: No Transportation Needs   Lack of Transportation (Medical): No   Lack of Transportation (Non-Medical): No  Physical Activity:  Insufficiently Active   Days of Exercise per Week: 7 days   Minutes of Exercise per Session: 10 min  Stress: No Stress Concern Present   Feeling of Stress : Only a little  Social Connections: Engineer, building services of Communication with Friends and Family: More than three times a week   Frequency of Social Gatherings with Friends and Family: More than three times a week   Attends Religious Services: More than 4 times per year   Active Member of [employer] or Organizations: Yes   Attends Music therapist: More than 4 times per year   Marital Status: Married  Human resources officer Violence: Not At Risk   Fear of Current or Ex-Partner: No   Emotionally Abused: No   Physically Abused: No   Sexually Abused: No    Outpatient Medications Prior to Visit  Medication Sig Dispense Refill   acetaminophen (TYLENOL) 325 MG tablet Take 650 mg by mouth every 6 (six) hours as needed for mild pain or fever.     albuterol (PROAIR HFA) 108 (90 Base) MCG/ACT inhaler Inhale 2 puffs into the lungs every 6 (six) hours as needed for wheezing or shortness of breath.      albuterol (PROVENTIL) (2.5 MG/3ML) 0.083% nebulizer solution Take 3 mLs (2.5 mg total) by nebulization every 6 (six) hours as needed for shortness of breath or wheezing. 360 mL 5   ALPRAZolam (XANAX) 0.25 MG tablet Take 0.25 mg by mouth 2 (two) times daily as needed for anxiety.     amoxicillin-clavulanate (AUGMENTIN) 875-125 MG tablet Take 1 tablet by mouth 2 (two) times daily. 20 tablet 0   Ascorbic Acid (VITAMIN C) 1000 MG tablet Take 1,000 mg by mouth daily.     aspirin EC 81 MG tablet Take 1 tablet (81 mg total) by mouth daily. Swallow whole. 90 tablet 3   atorvastatin (LIPITOR) 40 MG tablet Take 1 tablet (40 mg total) by mouth at bedtime. 90 tablet 0   bisoprolol (ZEBETA) 5 MG tablet Take 1 tablet (5 mg total) by mouth daily. 90 tablet 3   Budeson-Glycopyrrol-Formoterol (BREZTRI AEROSPHERE) 160-9-4.8 MCG/ACT AERO Inhale 2 puffs  into the lungs 2 (two) times daily. 32.1 g 3   calcium carbonate (OS-CAL) 600 MG TABS tablet Take 600 mg by mouth daily.     Cholecalciferol (VITAMIN D3) 50 MCG (2000 UT) TABS Take 2,000 Units by mouth in the morning.     denosumab (PROLIA) 60 MG/ML SOSY injection Inject 60 mg into the skin every 6 (six) months.     diltiazem (CARDIZEM) 60 MG tablet Take 1 tablet (60 mg total) by mouth 4 (four) times daily  as needed. (Patient taking differently: Take 60 mg by mouth 4 (four) times daily as needed (for A-Fib).) 30 tablet prn   ferrous sulfate 325 (65 FE) MG tablet Take 325 mg by mouth 2 (two) times daily with a meal.     furosemide (LASIX) 40 MG tablet Take 1 tablet (40 mg total) by mouth daily. (Patient taking differently: Take 40 mg by mouth in the morning.) 90 tablet 3   guaiFENesin (MUCINEX) 600 MG 12 hr tablet Take 600 mg by mouth in the morning and at bedtime.     loratadine (CLARITIN) 10 MG tablet Take 10 mg by mouth in the morning.     LORazepam (ATIVAN) 0.5 MG tablet Take 0.5 mg by mouth daily as needed for anxiety.     Multiple Vitamin (MULTIVITAMIN WITH MINERALS) TABS tablet Take 1 tablet by mouth daily with breakfast.     Niacin (VITAMIN B-3 PO) Take 500 mg by mouth.     Omeprazole 20 MG TBDD Take 20 mg by mouth See admin instructions. Take 20 mg by mouth one to two times a day before meals (Patient not taking: Reported on 04/24/2022)     OXYGEN Inhale 2-3 L/min into the lungs See admin instructions. Inhale 2-3 L/min into the lungs at bedtime and when ambulatory     pantoprazole (PROTONIX) 40 MG tablet Take 1 tablet by mouth once daily (Patient not taking: Reported on 04/24/2022) 90 tablet 3   predniSONE (DELTASONE) 20 MG tablet Take 17 mg by mouth daily with breakfast.     Probiotic Product (PROBIOTIC DAILY) CAPS Take 1 capsule by mouth in the morning.     sertraline (ZOLOFT) 50 MG tablet Take 1 tablet (50 mg total) by mouth daily. (Patient taking differently: Take 50 mg by mouth at bedtime.)  90 tablet 1   tiZANidine (ZANAFLEX) 2 MG tablet Take 1 tablet (2 mg total) by mouth every 6 (six) hours as needed for muscle spasms. 30 tablet 0   No facility-administered medications prior to visit.    Allergies  Allergen Reactions   Tape Other (See Comments)    PLEASE USE COBAN WRAP IN LIEU OF "TAPE," as it "pulls off the skin!!   Daliresp [Roflumilast] Diarrhea and Nausea And Vomiting   Levaquin [Levofloxacin] Palpitations and Other (See Comments)    Made the B/P fluctuate and heart raced   Alendronate Anxiety and Other (See Comments)    Jittery/nervous    Review of Systems  Constitutional:  Positive for fatigue. Negative for fever.  HENT:  Negative for sore throat.   Respiratory:  Positive for shortness of breath.   Cardiovascular:  Negative for chest pain and leg swelling.  Musculoskeletal:  Positive for arthralgias (right elbow swollen, red and warm to touch.). Negative for back pain, joint swelling and myalgias.  Skin:  Negative for rash.  Neurological:  Negative for dizziness and weakness.  Psychiatric/Behavioral:  Negative for dysphoric mood. The patient is not nervous/anxious.       Objective:    Physical Exam Vitals reviewed.  Constitutional:      Appearance: Normal appearance. He is normal weight.  Cardiovascular:     Rate and Rhythm: Normal rate and regular rhythm.     Pulses: Normal pulses.     Heart sounds: Normal heart sounds.  Pulmonary:     Effort: Pulmonary effort is normal.     Breath sounds: Normal breath sounds.  Abdominal:     General: Abdomen is flat. Bowel sounds are normal.  Palpations: Abdomen is soft.  Neurological:     Mental Status: He is alert and oriented to person, place, and time.  Psychiatric:        Mood and Affect: Mood normal.        Behavior: Behavior normal.    There were no vitals taken for this visit. Wt Readings from Last 3 Encounters:  04/08/22 156 lb (70.8 kg)  03/19/22 152 lb 3.2 oz (69 kg)  03/11/22 153 lb (69.4  kg)    Health Maintenance Due  Topic Date Due   OPHTHALMOLOGY EXAM  Never done   Hepatitis C Screening  Never done   Zoster Vaccines- Shingrix (2 of 2) 05/30/2014    There are no preventive care reminders to display for this patient.   Lab Results  Component Value Date   TSH 2.539 03/22/2019   Lab Results  Component Value Date   WBC 15.2 (H) 04/08/2022   HGB 11.9 (L) 04/08/2022   HCT 37.7 04/08/2022   MCV 82 04/08/2022   PLT 316 04/08/2022   Lab Results  Component Value Date   NA 145 (H) 04/08/2022   K 4.9 04/08/2022   CO2 28 04/08/2022   GLUCOSE 131 (H) 04/08/2022   BUN 19 04/08/2022   CREATININE 0.93 04/08/2022   BILITOT 0.2 04/08/2022   ALKPHOS 96 04/08/2022   AST 23 04/08/2022   ALT 25 04/08/2022   PROT 6.5 04/08/2022   ALBUMIN 4.1 04/08/2022   CALCIUM 9.8 04/08/2022   ANIONGAP 10 03/06/2022   EGFR 85 04/08/2022   Lab Results  Component Value Date   CHOL 146 02/11/2022   Lab Results  Component Value Date   HDL 54 02/11/2022   Lab Results  Component Value Date   LDLCALC 73 02/11/2022   Lab Results  Component Value Date   TRIG 104 02/11/2022   Lab Results  Component Value Date   CHOLHDL 2.7 02/11/2022   Lab Results  Component Value Date   HGBA1C 6.5 (H) 02/11/2022         Assessment & Plan:   There are no diagnoses linked to this encounter.   No orders of the defined types were placed in this encounter.   I,Lauren M Auman,acting as a scribe for [name], MD.,have documented all relevant documentation on the behalf of [name], MD,as directed by  [name], MD while in the presence of [name], MD.   Oleta Mouse, CMA

## 2022-05-16 ENCOUNTER — Encounter: Payer: Self-pay | Admitting: Family Medicine

## 2022-05-16 DIAGNOSIS — M7021 Olecranon bursitis, right elbow: Secondary | ICD-10-CM | POA: Insufficient documentation

## 2022-05-16 MED ORDER — TRIAMCINOLONE ACETONIDE 40 MG/ML IJ SUSP: 20.0000 mg | Freq: Once | INTRAMUSCULAR | Status: DC

## 2022-05-16 NOTE — Patient Instructions (Addendum)
Recommend keep Ace wrap on is much as possible over the next week.  Obviously may remove for showering. I discussed that this can recur, in which case we will need to redrain it.

## 2022-05-16 NOTE — Assessment & Plan Note (Addendum)
Risks were discussed including bleeding, infection, increase in sugars if diabetic, atrophy at site of injection, and increased pain. After consent was obtained, using sterile technique the right olecranon bursa was prepped with alcohol.  1 cc of lidocaine without epi was injected subcutaneously.  Then using an 18-gauge the bursa was entered and 4 cc of straw-colored fluid was withdrawn.  Kenalog 20 mg and 3 ml plain Lidocaine was then injected and the needle withdrawn.  The procedure was well tolerated. The patient is asked to continue to rest the joint for a few more days before resuming regular activities.  It may be more painful for the first 1-2 days.  Watch for fever, or increased swelling or persistent pain in the joint. Call or return to clinic as needed.  Right elbow was wrapped with an Ace bandage.

## 2022-05-22 DIAGNOSIS — I480 Paroxysmal atrial fibrillation: Secondary | ICD-10-CM | POA: Diagnosis not present

## 2022-05-22 DIAGNOSIS — J449 Chronic obstructive pulmonary disease, unspecified: Secondary | ICD-10-CM | POA: Diagnosis not present

## 2022-05-22 DIAGNOSIS — I10 Essential (primary) hypertension: Secondary | ICD-10-CM | POA: Diagnosis not present

## 2022-05-22 DIAGNOSIS — E119 Type 2 diabetes mellitus without complications: Secondary | ICD-10-CM | POA: Diagnosis not present

## 2022-05-23 NOTE — Progress Notes (Unsigned)
Cardiology Office Note:    Date:  05/24/2022   ID:  RIGLEY NIESS, DOB [birth year], MRN [id number]  PCP:  [name], MD   [facility] Providers Cardiologist:  Candee Furbish, MD Electrophysiologist:  Vickie Epley, MD     Referring MD: [name], MD   Chief Complaint: follow-up dizziness  History of Present Illness:    Paul [name] is a very pleasant 76 y.o. male with a hx of PAF, hypertension, type 2 diabetes, hyperlipidemia, severe COPD, hematuria, elevated coronary calcium score, and chronic HFpEF.   Referred to cardiology for management of atrial fibrillation and aortic and multivessel coronary atherosclerosis.  Had some intolerances of beta-blockers and CCB medications and also suffered from hematuria on long-term anticoagulation.  He was referred to Dr. Quentin Ore for consideration of Watchman device.  He underwent LAAO with Watchman FLX device by Dr. Quentin Ore on 08/02/2021 for PAF in the setting of hematuria, falls, and skin tears. Hospital admission 09/13/2021 for PAF. TEE performed demonstrated a well-seated Watchman device without any leak or thrombus, normal LV function. He saw Dr. Marlou Porch on 12/04/21.   He was last seen in our office on 01/28/2022 by Kathyrn Drown, NP.  He was changed from Plavix to aspirin monotherapy with plan for 1 year follow-up post implant.  He contacted our office on 04/09/22 with reports of dizziness. Cardiac monitor was ordered which revealed rare NSR with average HR 77 bpm, rare PACs, rare PVCs, no atrial fib, brief episodes of atrial tachycardia.  On 04/26/2022 he was advised to stop bisoprolol and hold daily Lasix and take only for weight gain of 3 lbs or more by Dr. Marlou Porch.   Today, he is here with his wife for follow-up of his dizziness. He reports no dizziness, lightheadedness since stopping bisoprolol and Lasix. Wife reports home SBP was 100 on bisoprolol.  He continues to monitor BP at home daily - reports highest systolic reading in the last 2  weeks was 137 mmHg. On home O2 not using it presently - can go some of the time without it. Does not weigh himself consistently but has not felt like he had extra fluid and no edema since stopping Lasix.  States he is overall feeling well.  Feels that his chronic dyspnea is stable. He denies chest pain, lower extremity edema, fatigue, palpitations, melena, hematuria, hemoptysis, diaphoresis, weakness, presyncope, syncope, orthopnea, and PND.  Past Medical History:  Diagnosis Date   Anemia    Aortic atherosclerosis (HCC)    Aspiration pneumonia due to inhalation of vomitus ([place]) 02/03/2022   Atrial fibrillation (HCC)    Back pain    COPD (chronic obstructive pulmonary disease) (HCC)    COPD exacerbation ([place]) 12/27/2021   COVID-19 12/24/2020   Diabetes mellitus without complication (HCC)    Elevated PSA    GAD (generalized anxiety disorder)    GERD (gastroesophageal reflux disease)    High cholesterol    Hypertension    Lingular pneumonia 01/05/2021   See cxr 01/06/20 rx with zpak/ cefipime x 5 days and then developed purulent sputum again 01/12/21 so rx levcaquin 500 mg daily x 7 days then return for cxr before more    Lung nodule < 6cm on CT 05/29/2016   Osteoporosis    Oxygen deficiency    Pneumonia    January 2022   Presence of Watchman left atrial appendage closure device 08/02/2021   s/p LAAO by Dr. Quentin Ore with a 20 mm Watchman FLX device   RSV bronchitis 01/27/2022  Vertebral compression fracture ([place]) 05/29/2019   T8 compression fracture noted on CT scan 05/28/2019 inpatient with chronic steroid dependent COPD - rx calcitonin nasal spray rx per PCP     Past Surgical History:  Procedure Laterality Date   CATARACT EXTRACTION Right    IR KYPHO EA ADDL LEVEL THORACIC OR LUMBAR  11/28/2021   LEFT ATRIAL APPENDAGE OCCLUSION N/A 08/02/2021   Procedure: LEFT ATRIAL APPENDAGE OCCLUSION;  Surgeon: Sherren Mocha, MD;  Location: [place] [facility];  Service: Cardiovascular;  Laterality: N/A;    SKIN SURGERY     shoulders and chest   TEE WITHOUT CARDIOVERSION N/A 08/02/2021   Procedure: TRANSESOPHAGEAL ECHOCARDIOGRAM (TEE);  Surgeon: Sherren Mocha, MD;  Location: [place] [facility];  Service: Cardiovascular;  Laterality: N/A;   TEE WITHOUT CARDIOVERSION N/A 09/13/2021   Procedure: TRANSESOPHAGEAL ECHOCARDIOGRAM (TEE);  Surgeon: Sanda Klein, MD;  Location: [facility];  Service: Cardiovascular;  Laterality: N/A;    Current Medications: Current Meds  Medication Sig   acetaminophen (TYLENOL) 325 MG tablet Take 650 mg by mouth every 6 (six) hours as needed for mild pain or fever.   albuterol (PROAIR HFA) 108 (90 Base) MCG/ACT inhaler Inhale 2 puffs into the lungs every 6 (six) hours as needed for wheezing or shortness of breath.    albuterol (PROVENTIL) (2.5 MG/3ML) 0.083% nebulizer solution Take 3 mLs (2.5 mg total) by nebulization every 6 (six) hours as needed for shortness of breath or wheezing.   ALPRAZolam (XANAX) 0.25 MG tablet Take 0.25 mg by mouth 2 (two) times daily as needed for anxiety.   Ascorbic Acid (VITAMIN C) 1000 MG tablet Take 1,000 mg by mouth daily.   aspirin EC 81 MG tablet Take 1 tablet (81 mg total) by mouth daily. Swallow whole.   atorvastatin (LIPITOR) 40 MG tablet Take 1 tablet (40 mg total) by mouth at bedtime.   Budeson-Glycopyrrol-Formoterol (BREZTRI AEROSPHERE) 160-9-4.8 MCG/ACT AERO Inhale 2 puffs into the lungs 2 (two) times daily.   calcium carbonate (OS-CAL) 600 MG TABS tablet Take 600 mg by mouth daily.   Cholecalciferol (VITAMIN D3) 50 MCG (2000 UT) TABS Take 2,000 Units by mouth in the morning.   denosumab (PROLIA) 60 MG/ML SOSY injection Inject 60 mg into the skin every 6 (six) months.   ferrous sulfate 325 (65 FE) MG tablet Take 325 mg by mouth 2 (two) times daily with a meal.   guaiFENesin (MUCINEX) 600 MG 12 hr tablet Take 600 mg by mouth in the morning and at bedtime.   loratadine (CLARITIN) 10 MG tablet Take 10 mg by mouth in the morning.    LORazepam (ATIVAN) 0.5 MG tablet Take 0.5 mg by mouth daily as needed for anxiety.   Multiple Vitamin (MULTIVITAMIN WITH MINERALS) TABS tablet Take 1 tablet by mouth daily with breakfast.   Niacin (VITAMIN B-3 PO) Take 500 mg by mouth.   OXYGEN Inhale 2-3 L/min into the lungs See admin instructions. Inhale 2-3 L/min into the lungs at bedtime and when ambulatory   predniSONE (DELTASONE) 20 MG tablet Take 17 mg by mouth daily with breakfast. 7.5 mg daily   Probiotic Product (PROBIOTIC DAILY) CAPS Take 1 capsule by mouth in the morning.   sertraline (ZOLOFT) 50 MG tablet Take 1 tablet (50 mg total) by mouth daily.   Current Facility-Administered Medications for the 05/24/22 encounter (Office Visit) with Emmaline Life, NP  Medication   triamcinolone acetonide (KENALOG-40) injection 20 mg     Allergies:   Tape, Daliresp [roflumilast],  Levaquin [levofloxacin], and Alendronate   Social History   Socioeconomic History   Marital status: Married    Spouse name: Not on file   Number of children: 3   Years of education: Not on file   Highest education level: Not on file  Occupational History   Occupation: retired    Comment: truck driver  Tobacco Use   Smoking status: Former    Packs/day: 2.00    Years: 52.00    Pack years: 104.00    Types: Cigarettes    Quit date: 02/03/2009    Years since quitting: 13.3   Smokeless tobacco: Never   Tobacco comments:    Counseled to remain smoke free  Vaping Use   Vaping Use: Never used  Substance and Sexual Activity   Alcohol use: Not Currently    Alcohol/week: 0.0 standard drinks    Comment: occ   Drug use: No   Sexual activity: Not on file  Other Topics Concern   Not on file  Social History Narrative   Originally from [place]. Previously worked driving a Restaurant manager, fast food. No pets currently. No mold exposure. 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       wears sunscreen, brushes  and flosses daily, see's dentist bi-annually, has smoke/carbon monoxide detectors, wears a seatbelt and practices gun safety      Social Determinants of Health   Financial Resource Strain: Medium Risk   Difficulty of Paying Living Expenses: Somewhat hard  Food Insecurity: No Food Insecurity   Worried About Charity fundraiser in the Last Year: Never true   Ran Out of Food in the Last Year: Never true  Transportation Needs: No Transportation Needs   Lack of Transportation (Medical): No   Lack of Transportation (Non-Medical): No  Physical Activity: Insufficiently Active   Days of Exercise per Week: 7 days   Minutes of Exercise per Session: 10 min  Stress: No Stress Concern Present   Feeling of Stress : Only a little  Social Connections: Engineer, building services of Communication with Friends and Family: More than three times a week   Frequency of Social Gatherings with Friends and Family: More than three times a week   Attends Religious Services: More than 4 times per year   Active Member of [employer] or Organizations: Yes   Attends Music therapist: More than 4 times per year   Marital Status: Married     Family History: The patient's family history includes Cancer in his paternal uncle; Heart disease in his brother, father, and mother.  ROS:   Please see the history of present illness.  All other systems reviewed and are negative.  Labs/Other Studies Reviewed:    The following studies were reviewed today:  Cardiac monitor 05/03/22  Sinus rhythm average heart rate 77 bpm. Rare PACs. Rare PVCs. Sometimes symptomatic. No atrial fibrillation noted. Brief episodes of atrial tachycardia. Benign.   Echo TEE 09/13/21   1. Left ventricular ejection fraction, by estimation, is 60 to 65%. The  left ventricle has normal function. The left ventricle has no regional  wall motion abnormalities.   2. Right ventricular systolic function is normal. The right ventricular   size is normal.   3. Well-seated Watchman FLX 20 mm device without leak or thrombus. No  left atrial/left atrial appendage thrombus was detected.   4. The mitral valve is normal in structure. No evidence of mitral  valve  regurgitation. No evidence of mitral stenosis.   5. The aortic valve is normal in structure. Aortic valve regurgitation is  not visualized. No aortic stenosis is present.   6. There is Moderate (Grade III) layered plaque involving the descending  aorta.   7. The inferior vena cava is normal in size with greater than 50%  respiratory variability, suggesting right atrial pressure of 3 mmHg.   Conclusion(s)/Recommendation(s): Normal biventricular function without  evidence of hemodynamically significant valvular heart disease. Watchman  device without leak or thrombus   CT Cardiac Morph 07/17/21 1. The left atrial appendage is a large chicken wing morphology without thrombus. 2. A 27 mm Watchman FLX device is recommended based on the above landing zone measurements (22.1 mm maximum diameter; 18% compression). 3. There is a very proximal lobe that will not be covered by the device. 4. There is no thrombus in the left atrial appendage. 5. A Mid-Mid IAS puncture site is recommended. 6. Optimal deployment angle: RAO 1 CRA 12 7. CAC score of 1046, which is 77th percentile for age-, sex-, and race-matched controls  Recent Labs: 03/02/2022: B Natriuretic Peptide 107.5 04/08/2022: ALT 25; BUN 19; Creatinine, Ser 0.93; Hemoglobin 11.9; Platelets 316; Potassium 4.9; Sodium 145  Recent Lipid Panel    Component Value Date/Time   CHOL 146 02/11/2022 0819   TRIG 104 02/11/2022 0819   HDL 54 02/11/2022 0819   CHOLHDL 2.7 02/11/2022 0819   LDLCALC 73 02/11/2022 0819     Risk Assessment/Calculations:       Physical Exam:    VS:  BP (!) 124/48   Pulse 90   Ht '5\' 6"'$  (1.676 m)   Wt 156 lb 3.2 oz (70.9 kg)   SpO2 94%   BMI 25.21 kg/m     Wt Readings from Last 3  Encounters:  05/24/22 156 lb 3.2 oz (70.9 kg)  05/13/22 156 lb (70.8 kg)  04/08/22 156 lb (70.8 kg)     GEN:  Well nourished, well developed in no acute distress HEENT: Normal NECK: No JVD; No carotid bruits CARDIAC: RRR, no murmurs, rubs, gallops RESPIRATORY:  Diminished breath sounds bilaterally without rales, wheezing or rhonchi  ABDOMEN: Soft, non-tender, protruding, + bowel sounds MUSCULOSKELETAL:  No edema; No deformity. 2+ pedal pulses, equal bilaterally SKIN: Warm and dry NEUROLOGIC:  Alert and oriented x 3 PSYCHIATRIC:  Normal affect   EKG:  EKG is not ordered today.    Diagnoses:    1. Presence of Watchman left atrial appendage closure device   2. Paroxysmal atrial fibrillation (HCC)   3. Syncope, unspecified syncope type   4. Dizziness   5. Hyperlipidemia LDL goal <70   6. Chronic diastolic CHF (congestive heart failure) ([place])   7. Chronic respiratory failure with hypoxia (HCC)    Assessment and Plan:     Syncope/dizziness: No recent episodes of dizziness or syncope since stopping bisoprolol and Lasix.   Elevated coronary calcium score/Hyperlipidemia LDL goal < 70: Calcium score 1046, 77th % for age-sex matched controls by coronary CTA 06/2021. LDL 73 on 02/11/22 checked by PCP. Encouraged heart healthy, mostly plant based diet. Continue atorvastatin.   PAF s/p Watchman device implant: HR is well-controlled. Heart rate is regular on examination. He denies palpitations or other symptoms concerning for arrhythmia. Tolerating aspirin without concerns.   Chronic HFpEF: Normal LVEF, grade 1 diastolic dysfunction by echo 06/2021.  GDMT limited by patient's intolerance of medications. He appears euvolemic today.  Feels that his chronic dyspnea is  stable, no orthopnea, PND, edema.  Advised him to monitor weight closely and take Lasix for weight increase greater than 2 pounds in 24 hours.   Chronic respiratory failure/COPD: He is without his oxygen during the visit.  States he  can go short periods of time without it.  Continue close follow-up with pulmonary medicine.   Disposition: 6 months with Dr. Marlou Porch      Medication Adjustments/Labs and Tests Ordered: Current medicines are reviewed at length with the patient today.  Concerns regarding medicines are outlined above.  No orders of the defined types were placed in this encounter.  No orders of the defined types were placed in this encounter.   Patient Instructions  Medication Instructions:  Your physician recommends that you continue on your current medications as directed. Please refer to the Current Medication list given to you today.  *If you need a refill on your cardiac medications before your next appointment, please call your pharmacy*   Lab Work: None If you have labs (blood work) drawn today and your tests are completely normal, you will receive your results only by: [place] (if you have MyChart) OR A paper copy in the mail If you have any lab test that is abnormal or we need to change your treatment, we will call you to review the results.  Follow-Up: At [facility], you and your health needs are our priority.  As part of our continuing mission to provide you with exceptional heart care, we have created designated Provider Care Teams.  These Care Teams include your primary Cardiologist (physician) and Advanced Practice Providers (APPs -  Physician Assistants and Nurse Practitioners) who all work together to provide you with the care you need, when you need it.  Your next appointment:   10/28/22 at 9:20 AM   The format for your next appointment:   In Person  Provider:   Candee Furbish, MD {  Important Information About Sugar         Signed, Emmaline Life, NP  05/24/2022 4:06 PM    [place]

## 2022-05-24 ENCOUNTER — Ambulatory Visit (INDEPENDENT_AMBULATORY_CARE_PROVIDER_SITE_OTHER): Payer: Medicare Other | Admitting: Nurse Practitioner

## 2022-05-24 ENCOUNTER — Encounter: Payer: Self-pay | Admitting: Nurse Practitioner

## 2022-05-24 VITALS — BP 124/48 | HR 90 | Ht 66.0 in | Wt 156.2 lb

## 2022-05-24 DIAGNOSIS — I5032 Chronic diastolic (congestive) heart failure: Secondary | ICD-10-CM

## 2022-05-24 DIAGNOSIS — J9611 Chronic respiratory failure with hypoxia: Secondary | ICD-10-CM | POA: Diagnosis not present

## 2022-05-24 DIAGNOSIS — R55 Syncope and collapse: Secondary | ICD-10-CM

## 2022-05-24 DIAGNOSIS — R42 Dizziness and giddiness: Secondary | ICD-10-CM | POA: Diagnosis not present

## 2022-05-24 DIAGNOSIS — Z95818 Presence of other cardiac implants and grafts: Secondary | ICD-10-CM

## 2022-05-24 DIAGNOSIS — I480 Paroxysmal atrial fibrillation: Secondary | ICD-10-CM | POA: Diagnosis not present

## 2022-05-24 DIAGNOSIS — E785 Hyperlipidemia, unspecified: Secondary | ICD-10-CM | POA: Diagnosis not present

## 2022-05-24 NOTE — Patient Instructions (Addendum)
Medication Instructions:  Your physician recommends that you continue on your current medications as directed. Please refer to the Current Medication list given to you today.  *If you need a refill on your cardiac medications before your next appointment, please call your pharmacy*   Lab Work: None If you have labs (blood work) drawn today and your tests are completely normal, you will receive your results only by: [place] (if you have MyChart) OR A paper copy in the mail If you have any lab test that is abnormal or we need to change your treatment, we will call you to review the results.  Follow-Up: At [facility], you and your health needs are our priority.  As part of our continuing mission to provide you with exceptional heart care, we have created designated Provider Care Teams.  These Care Teams include your primary Cardiologist (physician) and Advanced Practice Providers (APPs -  Physician Assistants and Nurse Practitioners) who all work together to provide you with the care you need, when you need it.  Your next appointment:   10/28/22 at 9:20 AM   The format for your next appointment:   In Person  Provider:   Candee Furbish, MD {  Important Information About Sugar

## 2022-05-28 ENCOUNTER — Ambulatory Visit (INDEPENDENT_AMBULATORY_CARE_PROVIDER_SITE_OTHER): Payer: Medicare Other | Admitting: Adult Health

## 2022-05-28 ENCOUNTER — Encounter: Payer: Self-pay | Admitting: Adult Health

## 2022-05-28 DIAGNOSIS — J449 Chronic obstructive pulmonary disease, unspecified: Secondary | ICD-10-CM

## 2022-05-28 DIAGNOSIS — I5032 Chronic diastolic (congestive) heart failure: Secondary | ICD-10-CM | POA: Diagnosis not present

## 2022-05-28 DIAGNOSIS — J9611 Chronic respiratory failure with hypoxia: Secondary | ICD-10-CM

## 2022-05-28 NOTE — Assessment & Plan Note (Signed)
Severe COPD with aggressive maintenance regimen with Breztri and chronic steroids with prednisone at 5 mg daily.  Patient is continue on flutter valve.  Activity as tolerated  Plan  Patient Instructions  Continue on BREZTRI 2 puffs Twice daily, rinse after use.  Continue Prednisone '5mg'$   daily  Mucinex Twice daily  As needed  Cough/congestion  Flutter valve Three times a day  .  Albuterol Neb or inhaler as needed  Wear Oxygen 2l/m  with activity and At bedtime   to maintain O2 sats >88-90%.  Activity as tolerated.  Aspirations precautions as discussed.  Please contact office for sooner follow up if symptoms do not improve or worsen or seek emergency care  Follow up with Dr. Melvyn Novas  Or Ashtyn Meland NP in 3-4  months and As needed

## 2022-05-28 NOTE — Assessment & Plan Note (Signed)
Continue on oxygen with activity and at bedtime.  Plan  Patient Instructions  Continue on BREZTRI 2 puffs Twice daily, rinse after use.  Continue Prednisone '5mg'$   daily  Mucinex Twice daily  As needed  Cough/congestion  Flutter valve Three times a day  .  Albuterol Neb or inhaler as needed  Wear Oxygen 2l/m  with activity and At bedtime   to maintain O2 sats >88-90%.  Activity as tolerated.  Aspirations precautions as discussed.  Please contact office for sooner follow up if symptoms do not improve or worsen or seek emergency care  Follow up with Dr. Melvyn Novas  Or Tuyet Bader NP in 3-4  months and As needed

## 2022-05-28 NOTE — Patient Instructions (Addendum)
Continue on BREZTRI 2 puffs Twice daily, rinse after use.  Continue Prednisone '5mg'$   daily  Mucinex Twice daily  As needed  Cough/congestion  Flutter valve Three times a day  .  Albuterol Neb or inhaler as needed  Wear Oxygen 2l/m  with activity and At bedtime   to maintain O2 sats >88-90%.  Activity as tolerated.  Aspirations precautions as discussed.  Please contact office for sooner follow up if symptoms do not improve or worsen or seek emergency care  Follow up with Paul [name]  Or Paul Bessler NP in 3-4  months and As needed

## 2022-05-28 NOTE — Progress Notes (Signed)
$'@Patient'r$  ID: Paul [name], male    DOB: 19-Nov-1946, 76 y.o.   MRN: [id number]  Chief Complaint  Patient presents with   Follow-up    Referring provider: [name], MD  HPI: 76 year old male former smoker quit in 2010, followed for severe COPD and chronic respiratory failure on oxygen.  Prone to recurrent COPD exacerbations Medical history significant for atrial fibrillation COVID-19 infection January 2022 requiring hospitalization, hospitalization February 2023 for rhinovirus with COPD exacerbation and acute on chronic respiratory failure  TEST/EVENTS :  05/29/16: FVC 2.08 L (86%) FEV1 0.83 L (31%) FEV1/FVC 0.40 FEF 25-75 0.30 L (14%) no bronchodilator response TLC 6.56 L (107%) RV 167% ERV 160% DLCO corrected 60% (hemoglobin 10.9) 07/15/13: FVC 2.68 L (60%) FEV1 1.63 L (53%) FEV1/FVC 0.61 FEF 25-75 0.76 L (26%) 02/04/12: FVC 2.18 L (57%) FEV1 1.09 L (36%) FEV1/FVC 0.50 FEF 25-75 0.33 L (11%)     CT sinus February 2021 no acute sinus disease noted CT chest February 2021 emphysematous changes, several old thoracic compression fractures   2D echo July 2020 EF 60-65%,   Palliative care started 2021 (nurse comes every 2 weeks)   05/28/2022 Follow up : COPD , O2 RF  Patient presents for a 32-monthfollow-up.  Patient has underlying severe COPD and chronic respiratory failure on oxygen.  Patient says overall he is doing better.  He is trying to become more active.  He is going to the gym 3 days a week.  Patient says he wants to work up and get his energy level back up so he can start playing golf again.  He remains on Breztri twice daily.  Is on chronic steroids with baseline prednisone at 5 mg daily.  He wears oxygen 2 L with activity and bedtime.  Patient says he does wear his oxygen during the daytime when he gets tired.  Does feel that over the last couple years he has had increased shortness of breath with activity with decreased activity tolerance but is trying to regain some of his  strength.  He denies any flare of cough or wheezing.  No hemoptysis.  Says appetite is good with no nausea vomiting or diarrhea.  Patient recently had some medication changes and dizziness has improved significantly. Says he is feeling much better from that standpoint.  Allergies  Allergen Reactions   Tape Other (See Comments)    PLEASE USE COBAN WRAP IN LIEU OF "TAPE," as it "pulls off the skin!!   Daliresp [Roflumilast] Diarrhea and Nausea And Vomiting   Levaquin [Levofloxacin] Palpitations and Other (See Comments)    Made the B/P fluctuate and heart raced   Alendronate Anxiety and Other (See Comments)    Jittery/nervous    Immunization History  Administered Date(s) Administered   Fluad Quad(high Dose 65+) 10/02/2020, 10/09/2021   Influenza Split 09/22/2013, 09/01/2015   Influenza Whole 09/04/2011, 09/21/2012   Influenza, High Dose Seasonal PF 09/02/2016, 09/10/2017, 08/31/2018, 09/24/2018, 10/21/2019   Influenza-Unspecified 08/23/2014   Moderna Covid-19 Vaccine Bivalent Booster 126yr& up 10/09/2021   Moderna SARS-COV2 Booster Vaccination 07/05/2021   Moderna Sars-Covid-2 Vaccination 01/17/2020, 02/14/2020, 10/06/2020   Pneumococcal Conjugate-13 01/11/2014, 12/09/2016   Pneumococcal Polysaccharide-23 08/23/2010, 08/29/2011   Tdap 02/01/2019   Zoster Recombinat (Shingrix) 04/04/2014    Past Medical History:  Diagnosis Date   Anemia    Aortic atherosclerosis ([facility] [place]   Aspiration pneumonia due to inhalation of vomitus ([facility]/11/2022   Atrial fibrillation (HCC)    Back pain  COPD (chronic obstructive pulmonary disease) (HCC)    COPD exacerbation ([place]) 12/27/2021   COVID-19 12/24/2020   Diabetes mellitus without complication (HCC)    Elevated PSA    GAD (generalized anxiety disorder)    GERD (gastroesophageal reflux disease)    High cholesterol    Hypertension    Lingular pneumonia 01/05/2021   See cxr 01/06/20 rx with zpak/ cefipime x 5 days and then developed purulent  sputum again 01/12/21 so rx levcaquin 500 mg daily x 7 days then return for cxr before more    Lung nodule < 6cm on CT 05/29/2016   Osteoporosis    Oxygen deficiency    Pneumonia    January 2022   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 Quentin Ore with a 20 mm Watchman FLX device   RSV bronchitis 01/27/2022   Vertebral compression fracture ([place]) 05/29/2019   T8 compression fracture noted on CT scan 05/28/2019 inpatient with chronic steroid dependent COPD - rx calcitonin nasal spray rx per PCP     Tobacco History: Social History   Tobacco Use  Smoking Status Former   Packs/day: 2.00   Years: 52.00   Pack years: 104.00   Types: Cigarettes   Quit date: 02/03/2009   Years since quitting: 13.3  Smokeless Tobacco Never  Tobacco Comments   Counseled to remain smoke free   Counseling given: Not Answered Tobacco comments: Counseled to remain smoke free   Outpatient Medications Prior to Visit  Medication Sig Dispense Refill   acetaminophen (TYLENOL) 325 MG tablet Take 650 mg by mouth every 6 (six) hours as needed for mild pain or fever.     albuterol (PROAIR HFA) 108 (90 Base) MCG/ACT inhaler Inhale 2 puffs into the lungs every 6 (six) hours as needed for wheezing or shortness of breath.      albuterol (PROVENTIL) (2.5 MG/3ML) 0.083% nebulizer solution Take 3 mLs (2.5 mg total) by nebulization every 6 (six) hours as needed for shortness of breath or wheezing. 360 mL 5   ALPRAZolam (XANAX) 0.25 MG tablet Take 0.25 mg by mouth 2 (two) times daily as needed for anxiety.     Ascorbic Acid (VITAMIN C) 1000 MG tablet Take 1,000 mg by mouth daily.     aspirin EC 81 MG tablet Take 1 tablet (81 mg total) by mouth daily. Swallow whole. 90 tablet 3   atorvastatin (LIPITOR) 40 MG tablet Take 1 tablet (40 mg total) by mouth at bedtime. 90 tablet 0   Budeson-Glycopyrrol-Formoterol (BREZTRI AEROSPHERE) 160-9-4.8 MCG/ACT AERO Inhale 2 puffs into the lungs 2 (two)  times daily. 32.1 g 3   calcium carbonate (OS-CAL) 600 MG TABS tablet Take 600 mg by mouth daily.     Cholecalciferol (VITAMIN D3) 50 MCG (2000 UT) TABS Take 2,000 Units by mouth in the morning.     denosumab (PROLIA) 60 MG/ML SOSY injection Inject 60 mg into the skin every 6 (six) months.     ferrous sulfate 325 (65 FE) MG tablet Take 325 mg by mouth 2 (two) times daily with a meal.     guaiFENesin (MUCINEX) 600 MG 12 hr tablet Take 600 mg by mouth in the morning and at bedtime.     loratadine (CLARITIN) 10 MG tablet Take 10 mg by mouth in the morning.     LORazepam (ATIVAN) 0.5 MG tablet Take 0.5 mg by mouth daily as needed for anxiety.     Multiple Vitamin (MULTIVITAMIN WITH MINERALS) TABS  tablet Take 1 tablet by mouth daily with breakfast.     OXYGEN Inhale 2-3 L/min into the lungs See admin instructions. Inhale 2-3 L/min into the lungs at bedtime and when ambulatory     predniSONE (DELTASONE) 20 MG tablet Take 17 mg by mouth daily with breakfast. 7.5 mg daily     Probiotic Product (PROBIOTIC DAILY) CAPS Take 1 capsule by mouth in the morning.     sertraline (ZOLOFT) 50 MG tablet Take 1 tablet (50 mg total) by mouth daily. 90 tablet 1   Niacin (VITAMIN B-3 PO) Take 500 mg by mouth. (Patient not taking: Reported on 05/28/2022)     Facility-Administered Medications Prior to Visit  Medication Dose Route Frequency Provider Last Rate Last Admin   triamcinolone acetonide (KENALOG-40) injection 20 mg  20 mg Intra-articular Once Cox, Kirsten, MD         Review of Systems:   Constitutional:   No  weight loss, night sweats,  Fevers, chills,  +fatigue, or  lassitude.  HEENT:   No headaches,  Difficulty swallowing,  Tooth/dental problems, or  Sore throat,                No sneezing, itching, ear ache, nasal congestion, post nasal drip,   CV:  No chest pain,  Orthopnea, PND, swelling in lower extremities, anasarca, dizziness, palpitations, syncope.   GI  No heartburn, indigestion, abdominal pain,  nausea, vomiting, diarrhea, change in bowel habits, loss of appetite, bloody stools.   Resp: .  No chest wall deformity  Skin: no rash or lesions.  GU: no dysuria, change in color of urine, no urgency or frequency.  No flank pain, no hematuria   MS:  No joint pain or swelling.  No decreased range of motion.  No back pain.    Physical Exam  BP 120/60 (BP Location: Left Arm, Patient Position: Sitting, Cuff Size: Normal)   Pulse 88   Temp 97.8 F (36.6 C) (Oral)   Ht '5\' 6"'$  (1.676 m)   Wt 153 lb 3.2 oz (69.5 kg)   SpO2 95%   BMI 24.73 kg/m   GEN: A/Ox3; pleasant , NAD, well nourished    HEENT:  [place]/AT,  NOSE-clear, THROAT-clear, no lesions, no postnasal drip or exudate noted.   NECK:  Supple w/ fair ROM; no JVD; normal carotid impulses w/o bruits; no thyromegaly or nodules palpated; no lymphadenopathy.    RESP  Clear  P & A; w/o, wheezes/ rales/ or rhonchi. no accessory muscle use, no dullness to percussion  CARD:  RRR, no m/r/g, no peripheral edema, pulses intact, no cyanosis or clubbing.  GI:   Soft & nt; nml bowel sounds; no organomegaly or masses detected.   Musco: Warm bil, no deformities or joint swelling noted.   Neuro: alert, no focal deficits noted.    Skin: Warm, no lesions or rashes    Lab Results:       ProBNP No results found for: PROBNP  Imaging:      Latest Ref Rng & Units 05/29/2016    9:15 AM  PFT Results  FVC-Pre L 1.92    FVC-Predicted Pre % 52    Pre FEV1/FVC % % 41    FEV1-Pre L 0.78    FEV1-Predicted Pre % 29      No results found for: NITRICOXIDE      Assessment & Plan:   COPD GOLD  III  Severe COPD with aggressive maintenance regimen with Breztri and chronic steroids with prednisone  at 5 mg daily.  Patient is continue on flutter valve.  Activity as tolerated  Plan  Patient Instructions  Continue on BREZTRI 2 puffs Twice daily, rinse after use.  Continue Prednisone '5mg'$   daily  Mucinex Twice daily  As needed   Cough/congestion  Flutter valve Three times a day  .  Albuterol Neb or inhaler as needed  Wear Oxygen 2l/m  with activity and At bedtime   to maintain O2 sats >88-90%.  Activity as tolerated.  Aspirations precautions as discussed.  Please contact office for sooner follow up if symptoms do not improve or worsen or seek emergency care  Follow up with Dr. Melvyn Novas  Or Latrail Pounders NP in 3-4  months and As needed        Chronic respiratory failure with hypoxia ([place]) Continue on oxygen with activity and at bedtime.  Plan  Patient Instructions  Continue on BREZTRI 2 puffs Twice daily, rinse after use.  Continue Prednisone '5mg'$   daily  Mucinex Twice daily  As needed  Cough/congestion  Flutter valve Three times a day  .  Albuterol Neb or inhaler as needed  Wear Oxygen 2l/m  with activity and At bedtime   to maintain O2 sats >88-90%.  Activity as tolerated.  Aspirations precautions as discussed.  Please contact office for sooner follow up if symptoms do not improve or worsen or seek emergency care  Follow up with Dr. Melvyn Novas  Or Catherin Doorn NP in 3-4  months and As needed        Chronic diastolic CHF (congestive heart failure) ([place]) Appears euvolemic on exam.  No evidence of volume overload.  Continue on current regimen.   I spent  30 minutes dedicated to the care of this patient on the date of this encounter to include pre-visit review of records, face-to-face time with the patient discussing conditions above, post visit ordering of testing, clinical documentation with the electronic health record, making appropriate referrals as documented, and communicating necessary findings to members of the patients care team.  What that she had  Rexene Edison, NP 05/28/2022

## 2022-05-28 NOTE — Assessment & Plan Note (Signed)
Appears euvolemic on exam.  No evidence of volume overload.  Continue on current regimen.

## 2022-05-31 ENCOUNTER — Telehealth: Payer: Self-pay | Admitting: Adult Health

## 2022-05-31 ENCOUNTER — Other Ambulatory Visit: Payer: Self-pay | Admitting: Family Medicine

## 2022-05-31 MED ORDER — PREDNISONE 5 MG PO TABS: 5.0000 mg | ORAL_TABLET | Freq: Every day | ORAL | 0 refills | Status: DC

## 2022-05-31 NOTE — Telephone Encounter (Signed)
Called and spoke with patients wife. Patients wife verified pharmacy. Refill was sent to the pharmacy.   Nothing further needed.

## 2022-06-21 ENCOUNTER — Other Ambulatory Visit: Payer: Self-pay | Admitting: Family Medicine

## 2022-06-21 DIAGNOSIS — I480 Paroxysmal atrial fibrillation: Secondary | ICD-10-CM | POA: Diagnosis not present

## 2022-06-21 DIAGNOSIS — I10 Essential (primary) hypertension: Secondary | ICD-10-CM | POA: Diagnosis not present

## 2022-06-21 DIAGNOSIS — E119 Type 2 diabetes mellitus without complications: Secondary | ICD-10-CM | POA: Diagnosis not present

## 2022-06-21 DIAGNOSIS — J449 Chronic obstructive pulmonary disease, unspecified: Secondary | ICD-10-CM | POA: Diagnosis not present

## 2022-06-24 ENCOUNTER — Telehealth: Payer: Self-pay

## 2022-06-24 NOTE — Progress Notes (Signed)
Chronic Care Management Pharmacy Assistant   Name: Paul [name]  MRN: [id number] DOB: [birth year]   Reason for Encounter: Disease State call for HTN   Recent office visits:  05/13/22 [name] MD. Seen for elbow pain. D/C Bisoprolol Fumarate '5mg'$ , Diltiazem '60mg'$ , Furosemide '40mg'$  and Pantoprazole '40mg'$ .   Recent consult visits:  05/31/22 (Pulmonary) Royal Piedra, Tammy NP. Telephone encounter. Started on Prednisone '5mg'$ .   05/28/22 (Pulmonary) Royal Piedra, Tammy NP.Seen for COPD. No med changes.  05/24/22 (Cardiology) Christen Bame NP. Seen for  follow up. D/C Omeprazole '20mg'$  and Tizanidine '2mg'$ .   Hospital visits:  None  Medications: Outpatient Encounter Medications as of 06/24/2022  Medication Sig   acetaminophen (TYLENOL) 325 MG tablet Take 650 mg by mouth every 6 (six) hours as needed for mild pain or fever.   albuterol (PROAIR HFA) 108 (90 Base) MCG/ACT inhaler Inhale 2 puffs into the lungs every 6 (six) hours as needed for wheezing or shortness of breath.    albuterol (PROVENTIL) (2.5 MG/3ML) 0.083% nebulizer solution Take 3 mLs (2.5 mg total) by nebulization every 6 (six) hours as needed for shortness of breath or wheezing.   ALPRAZolam (XANAX) 0.25 MG tablet Take 0.25 mg by mouth 2 (two) times daily as needed for anxiety.   Ascorbic Acid (VITAMIN C) 1000 MG tablet Take 1,000 mg by mouth daily.   aspirin EC 81 MG tablet Take 1 tablet (81 mg total) by mouth daily. Swallow whole.   atorvastatin (LIPITOR) 40 MG tablet TAKE 1 TABLET BY MOUTH EVERYDAY AT BEDTIME   Budeson-Glycopyrrol-Formoterol (BREZTRI AEROSPHERE) 160-9-4.8 MCG/ACT AERO Inhale 2 puffs into the lungs 2 (two) times daily.   calcium carbonate (OS-CAL) 600 MG TABS tablet Take 600 mg by mouth daily.   Cholecalciferol (VITAMIN D3) 50 MCG (2000 UT) TABS Take 2,000 Units by mouth in the morning.   denosumab (PROLIA) 60 MG/ML SOSY injection Inject 60 mg into the skin every 6 (six) months.   ferrous sulfate 325 (65 FE) MG tablet Take  325 mg by mouth 2 (two) times daily with a meal.   guaiFENesin (MUCINEX) 600 MG 12 hr tablet Take 600 mg by mouth in the morning and at bedtime.   loratadine (CLARITIN) 10 MG tablet Take 10 mg by mouth in the morning.   LORazepam (ATIVAN) 0.5 MG tablet Take 0.5 mg by mouth daily as needed for anxiety.   Multiple Vitamin (MULTIVITAMIN WITH MINERALS) TABS tablet Take 1 tablet by mouth daily with breakfast.   OXYGEN Inhale 2-3 L/min into the lungs See admin instructions. Inhale 2-3 L/min into the lungs at bedtime and when ambulatory   predniSONE (DELTASONE) 20 MG tablet Take 17 mg by mouth daily with breakfast. 7.5 mg daily   predniSONE (DELTASONE) 5 MG tablet Take 1 tablet (5 mg total) by mouth daily with breakfast.   Probiotic Product (PROBIOTIC DAILY) CAPS Take 1 capsule by mouth in the morning.   sertraline (ZOLOFT) 50 MG tablet TAKE 1 TABLET BY MOUTH EVERY DAY   Facility-Administered Encounter Medications as of 06/24/2022  Medication   triamcinolone acetonide (KENALOG-40) injection 20 mg     Recent Office Vitals: BP Readings from Last 3 Encounters:  05/28/22 120/60  05/24/22 (!) 124/48  05/13/22 120/60   Pulse Readings from Last 3 Encounters:  05/28/22 88  05/24/22 90  05/13/22 80    Wt Readings from Last 3 Encounters:  05/28/22 153 lb 3.2 oz (69.5 kg)  05/24/22 156 lb 3.2 oz (70.9 kg)  05/13/22 156 lb (  70.8 kg)     Kidney Function Lab Results  Component Value Date/Time   CREATININE 0.93 04/08/2022 02:21 PM   CREATININE 0.80 03/11/2022 09:52 AM   GFRNONAA >60 03/06/2022 06:49 PM   GFRAA 107 11/20/2020 12:02 PM       Latest Ref Rng & Units 04/08/2022    2:21 PM 03/11/2022    9:52 AM 03/06/2022    6:49 PM  BMP  Glucose 70 - 99 mg/dL 131  114  168   BUN 8 - 27 mg/dL '19  11  17   '$ Creatinine 0.76 - 1.27 mg/dL 0.93  0.80  0.90   BUN/Creat Ratio 10 - '24 20  14    '$ Sodium 134 - 144 mmol/L 145  145  139   Potassium 3.5 - 5.2 mmol/L 4.9  3.7  4.2   Chloride 96 - 106 mmol/L 99   99  100   CO2 20 - 29 mmol/L '28  29  29   '$ Calcium 8.6 - 10.2 mg/dL 9.8  9.0  8.8      Current antihypertensive regimen:  None noted  How often are you checking your Blood Pressure? daily  Current home BP readings: 06/24/22 126/76  06/23/22 126/66   Wrist or arm cuff:Arm  Caffeine intake:None  Salt intake:Limited  OTC medications including pseudoephedrine or NSAIDs? Tylenol prn    Any readings above 180/120? No  What recent interventions/DTPs have been made by any provider to improve Blood Pressure control since last CPP Visit: Pt has been taking off his BP meds and furosemide and stated his dizziness has gone away and hes back to feeling great. BP has been running normal   Any recent hospitalizations or ED visits since last visit with CPP? No  What diet changes have been made to improve Blood Pressure Control?  No diet changes   What exercise is being done to improve your Blood Pressure Control?  Pt is back going to the gym 3 days week   Adherence Review: Is the patient currently on ACE/ARB medication? No Does the patient have >5 day gap between last estimated fill dates? CPP to review  Care Gaps: Last annual wellness visit? None noted   Star Rating Drugs:  Medication:  Last Fill: Day Supply  None noted    [name], [facility] Pharmacist Assistant  [phone]

## 2022-07-03 ENCOUNTER — Telehealth: Payer: Self-pay | Admitting: Internal Medicine

## 2022-07-03 MED ORDER — PREDNISONE 5 MG PO TABS: 5.0000 mg | ORAL_TABLET | Freq: Every day | ORAL | 0 refills | Status: DC

## 2022-07-03 NOTE — Telephone Encounter (Signed)
90 day supply of prednisone '5mg'$  has been sent to preferred pharmacy. Patient is aware and voiced his understanding. Nothing further needed.

## 2022-07-15 DIAGNOSIS — M7021 Olecranon bursitis, right elbow: Secondary | ICD-10-CM | POA: Diagnosis not present

## 2022-07-15 DIAGNOSIS — M25521 Pain in right elbow: Secondary | ICD-10-CM | POA: Diagnosis not present

## 2022-07-16 ENCOUNTER — Inpatient Hospital Stay (HOSPITAL_BASED_OUTPATIENT_CLINIC_OR_DEPARTMENT_OTHER)
Admission: EM | Admit: 2022-07-16 | Discharge: 2022-07-21 | DRG: 190 | Disposition: A | Discharge status: Home / Self Care | Payer: Medicare Other | Attending: Internal Medicine | Admitting: Internal Medicine

## 2022-07-16 ENCOUNTER — Emergency Department (HOSPITAL_BASED_OUTPATIENT_CLINIC_OR_DEPARTMENT_OTHER): Payer: Medicare Other

## 2022-07-16 ENCOUNTER — Encounter (HOSPITAL_BASED_OUTPATIENT_CLINIC_OR_DEPARTMENT_OTHER): Payer: Self-pay

## 2022-07-16 ENCOUNTER — Other Ambulatory Visit: Payer: Self-pay

## 2022-07-16 DIAGNOSIS — Z9981 Dependence on supplemental oxygen: Secondary | ICD-10-CM

## 2022-07-16 DIAGNOSIS — E785 Hyperlipidemia, unspecified: Secondary | ICD-10-CM | POA: Diagnosis present

## 2022-07-16 DIAGNOSIS — E1169 Type 2 diabetes mellitus with other specified complication: Secondary | ICD-10-CM | POA: Diagnosis not present

## 2022-07-16 DIAGNOSIS — Z20822 Contact with and (suspected) exposure to covid-19: Secondary | ICD-10-CM | POA: Diagnosis present

## 2022-07-16 DIAGNOSIS — I700 Atherosclerosis of aorta: Secondary | ICD-10-CM | POA: Diagnosis not present

## 2022-07-16 DIAGNOSIS — J9621 Acute and chronic respiratory failure with hypoxia: Secondary | ICD-10-CM | POA: Diagnosis not present

## 2022-07-16 DIAGNOSIS — I110 Hypertensive heart disease with heart failure: Secondary | ICD-10-CM | POA: Diagnosis present

## 2022-07-16 DIAGNOSIS — M810 Age-related osteoporosis without current pathological fracture: Secondary | ICD-10-CM | POA: Diagnosis present

## 2022-07-16 DIAGNOSIS — E782 Mixed hyperlipidemia: Secondary | ICD-10-CM | POA: Diagnosis not present

## 2022-07-16 DIAGNOSIS — Z87891 Personal history of nicotine dependence: Secondary | ICD-10-CM

## 2022-07-16 DIAGNOSIS — R9431 Abnormal electrocardiogram [ECG] [EKG]: Secondary | ICD-10-CM | POA: Diagnosis not present

## 2022-07-16 DIAGNOSIS — K219 Gastro-esophageal reflux disease without esophagitis: Secondary | ICD-10-CM | POA: Diagnosis present

## 2022-07-16 DIAGNOSIS — I152 Hypertension secondary to endocrine disorders: Secondary | ICD-10-CM | POA: Diagnosis present

## 2022-07-16 DIAGNOSIS — J441 Chronic obstructive pulmonary disease with (acute) exacerbation: Secondary | ICD-10-CM | POA: Diagnosis not present

## 2022-07-16 DIAGNOSIS — R0602 Shortness of breath: Secondary | ICD-10-CM | POA: Diagnosis not present

## 2022-07-16 DIAGNOSIS — I480 Paroxysmal atrial fibrillation: Secondary | ICD-10-CM | POA: Diagnosis present

## 2022-07-16 DIAGNOSIS — M7031 Other bursitis of elbow, right elbow: Secondary | ICD-10-CM | POA: Diagnosis not present

## 2022-07-16 DIAGNOSIS — Z881 Allergy status to other antibiotic agents status: Secondary | ICD-10-CM

## 2022-07-16 DIAGNOSIS — Z7951 Long term (current) use of inhaled steroids: Secondary | ICD-10-CM

## 2022-07-16 DIAGNOSIS — F411 Generalized anxiety disorder: Secondary | ICD-10-CM | POA: Diagnosis not present

## 2022-07-16 DIAGNOSIS — F32A Depression, unspecified: Secondary | ICD-10-CM | POA: Diagnosis present

## 2022-07-16 DIAGNOSIS — Z888 Allergy status to other drugs, medicaments and biological substances status: Secondary | ICD-10-CM

## 2022-07-16 DIAGNOSIS — I5032 Chronic diastolic (congestive) heart failure: Secondary | ICD-10-CM | POA: Diagnosis present

## 2022-07-16 DIAGNOSIS — J449 Chronic obstructive pulmonary disease, unspecified: Secondary | ICD-10-CM | POA: Diagnosis present

## 2022-07-16 DIAGNOSIS — Z91048 Other nonmedicinal substance allergy status: Secondary | ICD-10-CM

## 2022-07-16 DIAGNOSIS — Z8616 Personal history of COVID-19: Secondary | ICD-10-CM

## 2022-07-16 DIAGNOSIS — I10 Essential (primary) hypertension: Secondary | ICD-10-CM | POA: Diagnosis present

## 2022-07-16 DIAGNOSIS — Z9841 Cataract extraction status, right eye: Secondary | ICD-10-CM

## 2022-07-16 DIAGNOSIS — Z95818 Presence of other cardiac implants and grafts: Secondary | ICD-10-CM | POA: Diagnosis not present

## 2022-07-16 DIAGNOSIS — Z8249 Family history of ischemic heart disease and other diseases of the circulatory system: Secondary | ICD-10-CM

## 2022-07-16 DIAGNOSIS — Z7952 Long term (current) use of systemic steroids: Secondary | ICD-10-CM | POA: Diagnosis not present

## 2022-07-16 DIAGNOSIS — Z79899 Other long term (current) drug therapy: Secondary | ICD-10-CM

## 2022-07-16 DIAGNOSIS — E1159 Type 2 diabetes mellitus with other circulatory complications: Secondary | ICD-10-CM | POA: Diagnosis present

## 2022-07-16 LAB — RESP PANEL BY RT-PCR (FLU A&B, COVID) ARPGX2
Influenza A by PCR: NEGATIVE
Influenza B by PCR: NEGATIVE
SARS Coronavirus 2 by RT PCR: NEGATIVE

## 2022-07-16 LAB — I-STAT VENOUS BLOOD GAS, ED
Acid-Base Excess: 0 mmol/L (ref 0.0–2.0)
Bicarbonate: 25.5 mmol/L (ref 20.0–28.0)
Calcium, Ion: 1.11 mmol/L — ABNORMAL LOW (ref 1.15–1.40)
HCT: 42 % (ref 39.0–52.0)
Hemoglobin: 14.3 g/dL (ref 13.0–17.0)
O2 Saturation: 40 %
Patient temperature: 99
Potassium: 4.5 mmol/L (ref 3.5–5.1)
Sodium: 134 mmol/L — ABNORMAL LOW (ref 135–145)
TCO2: 27 mmol/L (ref 22–32)
pCO2, Ven: 44.5 mmHg (ref 44–60)
pH, Ven: 7.367 (ref 7.25–7.43)
pO2, Ven: 24 mmHg — CL (ref 32–45)

## 2022-07-16 LAB — CBC WITH DIFFERENTIAL/PLATELET
Abs Immature Granulocytes: 0.04 10*3/uL (ref 0.00–0.07)
Basophils Absolute: 0 10*3/uL (ref 0.0–0.1)
Basophils Relative: 0 %
Eosinophils Absolute: 0 10*3/uL (ref 0.0–0.5)
Eosinophils Relative: 0 %
HCT: 42.9 % (ref 39.0–52.0)
Hemoglobin: 13.1 g/dL (ref 13.0–17.0)
Immature Granulocytes: 0 %
Lymphocytes Relative: 3 %
Lymphs Abs: 0.3 10*3/uL — ABNORMAL LOW (ref 0.7–4.0)
MCH: 24.8 pg — ABNORMAL LOW (ref 26.0–34.0)
MCHC: 30.5 g/dL (ref 30.0–36.0)
MCV: 81.1 fL (ref 80.0–100.0)
Monocytes Absolute: 0.3 10*3/uL (ref 0.1–1.0)
Monocytes Relative: 3 %
Neutro Abs: 11.5 10*3/uL — ABNORMAL HIGH (ref 1.7–7.7)
Neutrophils Relative %: 94 %
Platelets: 269 10*3/uL (ref 150–400)
RBC: 5.29 MIL/uL (ref 4.22–5.81)
RDW: 14.7 % (ref 11.5–15.5)
WBC: 12.2 10*3/uL — ABNORMAL HIGH (ref 4.0–10.5)
nRBC: 0 % (ref 0.0–0.2)

## 2022-07-16 LAB — BLOOD GAS, VENOUS
Acid-base deficit: 0.7 mmol/L (ref 0.0–2.0)
Bicarbonate: 25.8 mmol/L (ref 20.0–28.0)
O2 Saturation: 30.5 %
Patient temperature: 36.4
pCO2, Ven: 48 mmHg (ref 44–60)
pH, Ven: 7.34 (ref 7.25–7.43)
pO2, Ven: <31 mmHg — CL (ref 32–45)

## 2022-07-16 LAB — COMPREHENSIVE METABOLIC PANEL
ALT: 20 U/L (ref 0–44)
AST: 27 U/L (ref 15–41)
Albumin: 4.5 g/dL (ref 3.5–5.0)
Alkaline Phosphatase: 74 U/L (ref 38–126)
Anion gap: 14 (ref 5–15)
BUN: 10 mg/dL (ref 8–23)
CO2: 21 mmol/L — ABNORMAL LOW (ref 22–32)
Calcium: 9.4 mg/dL (ref 8.9–10.3)
Chloride: 99 mmol/L (ref 98–111)
Creatinine, Ser: 1.01 mg/dL (ref 0.61–1.24)
GFR, Estimated: >60 mL/min (ref >60)
Glucose, Bld: 200 mg/dL — ABNORMAL HIGH (ref 70–99)
Potassium: 4.3 mmol/L (ref 3.5–5.1)
Sodium: 134 mmol/L — ABNORMAL LOW (ref 135–145)
Total Bilirubin: 0.3 mg/dL (ref 0.3–1.2)
Total Protein: 7.3 g/dL (ref 6.5–8.1)

## 2022-07-16 LAB — TROPONIN I (HIGH SENSITIVITY): Troponin I (High Sensitivity): 4 ng/L (ref <18)

## 2022-07-16 LAB — BRAIN NATRIURETIC PEPTIDE: B Natriuretic Peptide: 48.8 pg/mL (ref 0.0–100.0)

## 2022-07-16 MED ORDER — ORAL CARE MOUTH RINSE: 15.0000 mL | OROMUCOSAL | Status: DC | PRN

## 2022-07-16 MED ORDER — LORAZEPAM 2 MG/ML IJ SOLN
0.5000 mg | Freq: Once | INTRAMUSCULAR | Status: AC
  Administered 2022-07-16: 0.5 mg via INTRAVENOUS
  Filled 2022-07-16: qty 1

## 2022-07-16 MED ORDER — LEVALBUTEROL HCL 1.25 MG/0.5ML IN NEBU
1.2500 mg | INHALATION_SOLUTION | Freq: Once | RESPIRATORY_TRACT | Status: AC
  Administered 2022-07-16: 1.25 mg via RESPIRATORY_TRACT
  Filled 2022-07-16: qty 0.5

## 2022-07-16 MED ORDER — METHYLPREDNISOLONE SODIUM SUCC 125 MG IJ SOLR
125.0000 mg | Freq: Once | INTRAMUSCULAR | Status: AC
  Administered 2022-07-16: 125 mg via INTRAVENOUS
  Filled 2022-07-16: qty 2

## 2022-07-16 MED ORDER — CHLORHEXIDINE GLUCONATE CLOTH 2 % EX PADS
6.0000 | MEDICATED_PAD | Freq: Every day | CUTANEOUS | Status: DC
  Administered 2022-07-16 – 2022-07-19 (×4): 6 via TOPICAL

## 2022-07-16 MED ORDER — MAGNESIUM SULFATE 2 GM/50ML IV SOLN
2.0000 g | Freq: Once | INTRAVENOUS | Status: AC
  Administered 2022-07-16: 2 g via INTRAVENOUS
  Filled 2022-07-16: qty 50

## 2022-07-16 MED ORDER — ORAL CARE MOUTH RINSE
15.0000 mL | OROMUCOSAL | Status: DC
  Administered 2022-07-17 – 2022-07-21 (×14): 15 mL via OROMUCOSAL

## 2022-07-16 MED ORDER — LACTATED RINGERS IV SOLN
INTRAVENOUS | Status: DC
Start: 2022-07-17 — End: 2022-07-17

## 2022-07-16 MED ORDER — IPRATROPIUM-ALBUTEROL 0.5-2.5 (3) MG/3ML IN SOLN
3.0000 mL | RESPIRATORY_TRACT | Status: AC
  Administered 2022-07-16 (×3): 3 mL via RESPIRATORY_TRACT
  Filled 2022-07-16: qty 9

## 2022-07-16 NOTE — ED Notes (Signed)
Attempted to take off BiPAP per patient request. Patient became more SOB. Placed BiPAP back on.

## 2022-07-16 NOTE — ED Provider Notes (Signed)
I spoke with Dr. [place] Pink who is excepted the patient for admission.   Malvin Johns, MD 07/16/22 [phone]

## 2022-07-16 NOTE — ED Notes (Signed)
States brother was in an accident this morning, states symptoms started after that. Denies hx of anxiety. HX of COPD

## 2022-07-16 NOTE — ED Notes (Signed)
Upon arrival to room, patient SpO2 on RA 77%, increased work of breathing, placed on 2L [place], Dr. Tyrone Nine at bedside

## 2022-07-16 NOTE — ED Notes (Signed)
Signature pad not working, pt verbal  to consent to transfer

## 2022-07-16 NOTE — ED Notes (Signed)
Neb given to Paul [name], RT as she set up BiPap

## 2022-07-16 NOTE — ED Notes (Signed)
Report given to [employer]

## 2022-07-16 NOTE — ED Notes (Signed)
Attempted to call report , RN unavailable due to being in a pt room

## 2022-07-16 NOTE — Progress Notes (Signed)
Patient placed back on BiPAP due to increased SOB.

## 2022-07-16 NOTE — ED Notes (Signed)
ED Provider at bedside. 

## 2022-07-16 NOTE — ED Notes (Signed)
RT assessed patient at triage. Stated he wears 3L at home as needed and noted his sat was 71%. SAT in triage on 3L was 97%. SOB but BBS clear. EMT obtaining EKG

## 2022-07-16 NOTE — ED Notes (Signed)
Patient requested to be taken off BiPAP again. This time seems to be doing a little better. Currently on 3L, SAT 98%. Will check back in shortly

## 2022-07-16 NOTE — ED Triage Notes (Signed)
C/o shortness of breath since today. Hx of afib. Denies chest pain. Sleeps with 2L O2 via [place]. Today checked SpO2 and it was 71% on RA, arrived on home oxygen at 3L.

## 2022-07-16 NOTE — ED Provider Notes (Signed)
[facility] EMERGENCY DEPARTMENT Provider Note   CSN: [id number] Arrival date & time: 07/16/22  1259     History  Chief Complaint  Patient presents with   Shortness of Breath    Paul [name] is a 76 y.o. male.  76 yo M with a chief complaint of difficulty breathing.  Tells me that this has been going on for the past day or so.  Has a history of COPD he used his albuterol inhaler and then took a DuoNeb without any improvement and so came for evaluation.  He is on 2 L at home at nights and with exertion.  Found that on 3 L he was still in the 82s.   Shortness of Breath      Home Medications Prior to Admission medications   Medication Sig Start Date End Date Taking? Authorizing Provider  acetaminophen (TYLENOL) 325 MG tablet Take 650 mg by mouth every 6 (six) hours as needed for mild pain or fever.    [provider]  albuterol (PROAIR HFA) 108 (90 Base) MCG/ACT inhaler Inhale 2 puffs into the lungs every 6 (six) hours as needed for wheezing or shortness of breath.     [provider]  albuterol (PROVENTIL) (2.5 MG/3ML) 0.083% nebulizer solution Take 3 mLs (2.5 mg total) by nebulization every 6 (six) hours as needed for shortness of breath or wheezing. 02/11/22   Cox, Elnita Maxwell, MD  ALPRAZolam Duanne Moron) 0.25 MG tablet Take 0.25 mg by mouth 2 (two) times daily as needed for anxiety.    [provider]  Ascorbic Acid (VITAMIN C) 1000 MG tablet Take 1,000 mg by mouth daily.    [provider]  aspirin EC 81 MG tablet Take 1 tablet (81 mg total) by mouth daily. Swallow whole. 01/28/22   Tommie Raymond, NP  atorvastatin (LIPITOR) 40 MG tablet TAKE 1 TABLET BY MOUTH EVERYDAY AT BEDTIME 05/31/22   Cox, Kirsten, MD  Budeson-Glycopyrrol-Formoterol (BREZTRI AEROSPHERE) 160-9-4.8 MCG/ACT AERO Inhale 2 puffs into the lungs 2 (two) times daily. 03/26/21   Cox, Elnita Maxwell, MD  calcium carbonate (OS-CAL) 600 MG TABS tablet Take 600 mg by mouth daily.    [provider]  Cholecalciferol (VITAMIN D3) 50 MCG (2000 UT) TABS Take 2,000 Units by mouth in the morning.    [provider]  denosumab (PROLIA) 60 MG/ML SOSY injection Inject 60 mg into the skin every 6 (six) months.    [provider]  ferrous sulfate 325 (65 FE) MG tablet Take 325 mg by mouth 2 (two) times daily with a meal.    [provider]  guaiFENesin (MUCINEX) 600 MG 12 hr tablet Take 600 mg by mouth in the morning and at bedtime.    [provider]  loratadine (CLARITIN) 10 MG tablet Take 10 mg by mouth in the morning.    [provider]  LORazepam (ATIVAN) 0.5 MG tablet Take 0.5 mg by mouth daily as needed for anxiety.    [provider]  Multiple Vitamin (MULTIVITAMIN WITH MINERALS) TABS tablet Take 1 tablet by mouth daily with breakfast.    [provider]  OXYGEN Inhale 2-3 L/min into the lungs See admin instructions. Inhale 2-3 L/min into the lungs at bedtime and when ambulatory    [provider]  predniSONE (DELTASONE) 20 MG tablet Take 17 mg by mouth daily with breakfast. 7.5 mg daily    [provider]  predniSONE (DELTASONE) 5 MG tablet Take 1 tablet (5 mg  total) by mouth daily with breakfast. 07/03/22   Tanda Rockers, MD  Probiotic Product (PROBIOTIC DAILY) CAPS Take 1 capsule by mouth in the morning.    [provider]  sertraline (ZOLOFT) 50 MG tablet TAKE 1 TABLET BY MOUTH EVERY DAY 06/21/22   Cox, Kirsten, MD      Allergies    Tape, Daliresp [roflumilast], Levaquin [levofloxacin], and Alendronate    Review of Systems   Review of Systems  Respiratory:  Positive for shortness of breath.     Physical Exam Updated Vital Signs BP (!) 144/79   Pulse (!) 122   Temp 99 F (37.2 C)   Resp 16   Ht '5\' 6"'$  (1.676 m)   Wt 69.3 kg   SpO2 99%   BMI 24.66 kg/m  Physical Exam Vitals and nursing note reviewed.  Constitutional:      Appearance: He is well-developed.  HENT:      Head: Normocephalic and atraumatic.  Eyes:     Pupils: Pupils are equal, round, and reactive to light.  Neck:     Vascular: No JVD.  Cardiovascular:     Rate and Rhythm: Normal rate and regular rhythm.     Heart sounds: No murmur heard.    No friction rub. No gallop.  Pulmonary:     Effort: Tachypnea present. No respiratory distress.     Breath sounds: Decreased breath sounds and wheezing present.  Abdominal:     General: There is no distension.     Tenderness: There is no abdominal tenderness. There is no guarding or rebound.  Musculoskeletal:        General: Normal range of motion.     Cervical back: Normal range of motion and neck supple.  Skin:    Coloration: Skin is not pale.     Findings: No rash.  Neurological:     Mental Status: He is alert and oriented to person, place, and time.  Psychiatric:        Behavior: Behavior normal.     ED Results / Procedures / Treatments   Labs (all labs ordered are listed, but only abnormal results are displayed) Labs Reviewed  CBC WITH DIFFERENTIAL/PLATELET - Abnormal; Notable for the following components:      Result Value   WBC 12.2 (*)    MCH 24.8 (*)    Neutro Abs 11.5 (*)    Lymphs Abs 0.3 (*)    All other components within normal limits  I-STAT VENOUS BLOOD GAS, ED - Abnormal; Notable for the following components:   pO2, Ven 24 (*)    Sodium 134 (*)    Calcium, Ion 1.11 (*)    All other components within normal limits  BRAIN NATRIURETIC PEPTIDE  COMPREHENSIVE METABOLIC PANEL  BLOOD GAS, VENOUS  TROPONIN I (HIGH SENSITIVITY)    EKG EKG Interpretation  Date/Time:  Tuesday July 16 2022 13:08:13 EDT Ventricular Rate:  129 PR Interval:  122 QRS Duration: 78 QT Interval:  268 QTC Calculation: 392 R Axis:   76 Text Interpretation: Sinus tachycardia Otherwise normal ECG Otherwise no significant change Confirmed by Deno Etienne [phone]) on 07/16/2022 1:20:51 PM  Radiology DG Chest Port 1 View  Result Date:  07/16/2022 CLINICAL DATA:  Shortness of breath EXAM: PORTABLE CHEST 1 VIEW COMPARISON:  Chest x-ray dated March 02, 2022 FINDINGS: The heart size and mediastinal contours are within normal limits. Both lungs are clear. The visualized skeletal structures are unremarkable. IMPRESSION: No active disease. Electronically Signed  By: Yetta Glassman M.D.   On: 07/16/2022 13:29    Procedures Procedures    Medications Ordered in ED Medications  ipratropium-albuterol (DUONEB) 0.5-2.5 (3) MG/3ML nebulizer solution 3 mL (3 mLs Nebulization Given 07/16/22 1348)  methylPREDNISolone sodium succinate (SOLU-MEDROL) 125 mg/2 mL injection 125 mg (125 mg Intravenous Given 07/16/22 1333)  magnesium sulfate IVPB 2 g 50 mL (2 g Intravenous New Bag/Given 07/16/22 1333)  levalbuterol (XOPENEX) nebulizer solution 1.25 mg (1.25 mg Nebulization Given 07/16/22 1514)    ED Course/ Medical Decision Making/ A&P                           Medical Decision Making Amount and/or Complexity of Data Reviewed Labs: ordered. Radiology: ordered.  Risk Prescription drug management.   76 yo M with a chief complaint of difficulty breathing.  Been going on for the past day or so.  Has a history of COPD and thinks it feels the same.  Having some cough and very minimal sputum.  We will obtain a chest x-ray blood work.  He is breathing about 45 times a minute on my initial exam will start on BiPAP.  3 DuoNebs back-to-back steroids and magnesium.  Reassess.  Patient feeling mildly better on reassessment.  He was trialed off of BiPAP and quickly became more tachypneic and was placed back on BiPAP.  His troponin and BNP are negative.  Unfortunately we have had some difficulty with her metabolic panel.  Still pending.  No anemia.  Chest x-ray independently interpreted by me without obvious focal infiltrate or pneumothorax.  CRITICAL CARE Performed by: Cecilio Asper   Total critical care time: 80 minutes  Critical care time was  exclusive of separately billable procedures and treating other patients.  Critical care was necessary to treat or prevent imminent or life-threatening deterioration.  Critical care was time spent personally by me on the following activities: development of treatment plan with patient and/or surrogate as well as nursing, discussions with consultants, evaluation of patient's response to treatment, examination of patient, obtaining history from patient or surrogate, ordering and performing treatments and interventions, ordering and review of laboratory studies, ordering and review of radiographic studies, pulse oximetry and re-evaluation of patient's condition.   The patients results and plan were reviewed and discussed.   Any x-rays performed were independently reviewed by myself.   Differential diagnosis were considered with the presenting HPI.  Medications  ipratropium-albuterol (DUONEB) 0.5-2.5 (3) MG/3ML nebulizer solution 3 mL (3 mLs Nebulization Given 07/16/22 1348)  methylPREDNISolone sodium succinate (SOLU-MEDROL) 125 mg/2 mL injection 125 mg (125 mg Intravenous Given 07/16/22 1333)  magnesium sulfate IVPB 2 g 50 mL (2 g Intravenous New Bag/Given 07/16/22 1333)  levalbuterol (XOPENEX) nebulizer solution 1.25 mg (1.25 mg Nebulization Given 07/16/22 1514)    Vitals:   07/16/22 1344 07/16/22 1400 07/16/22 1440 07/16/22 1500  BP:  (!) 131/100 (!) 146/116 (!) 144/79  Pulse: (!) 134 (!) 119 (!) 126 (!) 122  Resp: (!) '24 16 17 16  '$ Temp:    99 F (37.2 C)  TempSrc:      SpO2: 100% 96% 100% 99%  Weight:      Height:        Final diagnoses:  COPD exacerbation (HCC)  Acute on chronic respiratory failure with hypoxia ([place])    Admission/ observation were discussed with the admitting physician, patient and/or family and they are comfortable with the plan.  Final Clinical Impression(s) / ED Diagnoses Final diagnoses:  COPD exacerbation ([place])  Acute on chronic respiratory  failure with hypoxia [facility])    Rx / DC Orders ED Discharge Orders     None         Deno Etienne, DO 07/16/22 1534

## 2022-07-17 DIAGNOSIS — J441 Chronic obstructive pulmonary disease with (acute) exacerbation: Secondary | ICD-10-CM

## 2022-07-17 DIAGNOSIS — E785 Hyperlipidemia, unspecified: Secondary | ICD-10-CM

## 2022-07-17 DIAGNOSIS — I10 Essential (primary) hypertension: Secondary | ICD-10-CM

## 2022-07-17 DIAGNOSIS — J9621 Acute and chronic respiratory failure with hypoxia: Secondary | ICD-10-CM

## 2022-07-17 DIAGNOSIS — E782 Mixed hyperlipidemia: Secondary | ICD-10-CM

## 2022-07-17 DIAGNOSIS — E1169 Type 2 diabetes mellitus with other specified complication: Secondary | ICD-10-CM | POA: Diagnosis not present

## 2022-07-17 DIAGNOSIS — I480 Paroxysmal atrial fibrillation: Secondary | ICD-10-CM

## 2022-07-17 LAB — CBC
HCT: 38.5 % — ABNORMAL LOW (ref 39.0–52.0)
Hemoglobin: 11.7 g/dL — ABNORMAL LOW (ref 13.0–17.0)
MCH: 24.8 pg — ABNORMAL LOW (ref 26.0–34.0)
MCHC: 30.4 g/dL (ref 30.0–36.0)
MCV: 81.7 fL (ref 80.0–100.0)
Platelets: 235 10*3/uL (ref 150–400)
RBC: 4.71 MIL/uL (ref 4.22–5.81)
RDW: 14.8 % (ref 11.5–15.5)
WBC: 8.1 10*3/uL (ref 4.0–10.5)
nRBC: 0 % (ref 0.0–0.2)

## 2022-07-17 LAB — RESPIRATORY PANEL BY PCR

## 2022-07-17 LAB — GLUCOSE, CAPILLARY
Glucose-Capillary: 112 mg/dL — ABNORMAL HIGH (ref 70–99)
Glucose-Capillary: 120 mg/dL — ABNORMAL HIGH (ref 70–99)
Glucose-Capillary: 126 mg/dL — ABNORMAL HIGH (ref 70–99)
Glucose-Capillary: 139 mg/dL — ABNORMAL HIGH (ref 70–99)
Glucose-Capillary: 144 mg/dL — ABNORMAL HIGH (ref 70–99)
Glucose-Capillary: 152 mg/dL — ABNORMAL HIGH (ref 70–99)

## 2022-07-17 LAB — BASIC METABOLIC PANEL
Anion gap: 9 (ref 5–15)
BUN: 12 mg/dL (ref 8–23)
CO2: 24 mmol/L (ref 22–32)
Calcium: 8.5 mg/dL — ABNORMAL LOW (ref 8.9–10.3)
Chloride: 105 mmol/L (ref 98–111)
Creatinine, Ser: 0.84 mg/dL (ref 0.61–1.24)
GFR, Estimated: >60 mL/min (ref >60)
Glucose, Bld: 149 mg/dL — ABNORMAL HIGH (ref 70–99)
Potassium: 4.7 mmol/L (ref 3.5–5.1)
Sodium: 138 mmol/L (ref 135–145)

## 2022-07-17 LAB — HIV ANTIBODY (ROUTINE TESTING W REFLEX): HIV Screen 4th Generation wRfx: NONREACTIVE

## 2022-07-17 LAB — MRSA NEXT GEN BY PCR, NASAL: MRSA by PCR Next Gen: NOT DETECTED

## 2022-07-17 MED ORDER — METHYLPREDNISOLONE SODIUM SUCC 40 MG IJ SOLR
40.0000 mg | Freq: Two times a day (BID) | INTRAMUSCULAR | Status: DC
  Administered 2022-07-17 – 2022-07-21 (×9): 40 mg via INTRAVENOUS
  Filled 2022-07-17 (×9): qty 1

## 2022-07-17 MED ORDER — ATORVASTATIN CALCIUM 40 MG PO TABS
40.0000 mg | ORAL_TABLET | Freq: Every day | ORAL | Status: DC
Start: 2022-07-17 — End: 2022-07-21
  Administered 2022-07-17 – 2022-07-21 (×5): 40 mg via ORAL
  Filled 2022-07-17 (×5): qty 1

## 2022-07-17 MED ORDER — MELATONIN 3 MG PO TABS
3.0000 mg | ORAL_TABLET | Freq: Every day | ORAL | Status: DC
  Administered 2022-07-17 – 2022-07-20 (×4): 3 mg via ORAL
  Filled 2022-07-17 (×4): qty 1

## 2022-07-17 MED ORDER — RISAQUAD PO CAPS
1.0000 | ORAL_CAPSULE | Freq: Every morning | ORAL | Status: DC
Start: 2022-07-18 — End: 2022-07-21
  Administered 2022-07-18 – 2022-07-21 (×4): 1 via ORAL
  Filled 2022-07-17 (×4): qty 1

## 2022-07-17 MED ORDER — IPRATROPIUM-ALBUTEROL 0.5-2.5 (3) MG/3ML IN SOLN: 3.0000 mL | Freq: Four times a day (QID) | RESPIRATORY_TRACT | Status: DC

## 2022-07-17 MED ORDER — ENOXAPARIN SODIUM 40 MG/0.4ML IJ SOSY
40.0000 mg | PREFILLED_SYRINGE | INTRAMUSCULAR | Status: DC
Start: 2022-07-17 — End: 2022-07-21
  Administered 2022-07-17 – 2022-07-21 (×5): 40 mg via SUBCUTANEOUS
  Filled 2022-07-17 (×5): qty 0.4

## 2022-07-17 MED ORDER — LORATADINE 10 MG PO TABS
10.0000 mg | ORAL_TABLET | Freq: Every day | ORAL | Status: DC
Start: 2022-07-17 — End: 2022-07-21
  Administered 2022-07-17 – 2022-07-21 (×5): 10 mg via ORAL
  Filled 2022-07-17 (×5): qty 1

## 2022-07-17 MED ORDER — UMECLIDINIUM BROMIDE 62.5 MCG/ACT IN AEPB
1.0000 | INHALATION_SPRAY | Freq: Every day | RESPIRATORY_TRACT | Status: DC
Start: 2022-07-17 — End: 2022-07-17
  Filled 2022-07-17: qty 7

## 2022-07-17 MED ORDER — ALPRAZOLAM 0.25 MG PO TABS: 0.2500 mg | ORAL_TABLET | Freq: Two times a day (BID) | ORAL | Status: DC | PRN

## 2022-07-17 MED ORDER — SERTRALINE HCL 100 MG PO TABS: 50.0000 mg | ORAL_TABLET | Freq: Every day | ORAL | Status: DC

## 2022-07-17 MED ORDER — VITAMIN D3 25 MCG (1000 UNIT) PO TABS
2000.0000 [IU] | ORAL_TABLET | Freq: Every morning | ORAL | Status: DC
  Administered 2022-07-17 – 2022-07-21 (×5): 2000 [IU] via ORAL
  Filled 2022-07-17 (×5): qty 2

## 2022-07-17 MED ORDER — FERROUS SULFATE 325 (65 FE) MG PO TABS
325.0000 mg | ORAL_TABLET | Freq: Two times a day (BID) | ORAL | Status: DC
  Administered 2022-07-17 – 2022-07-21 (×8): 325 mg via ORAL
  Filled 2022-07-17 (×8): qty 1

## 2022-07-17 MED ORDER — SERTRALINE HCL 50 MG PO TABS
50.0000 mg | ORAL_TABLET | Freq: Every day | ORAL | Status: DC
  Administered 2022-07-17 – 2022-07-20 (×4): 50 mg via ORAL
  Filled 2022-07-17 (×4): qty 1

## 2022-07-17 MED ORDER — BUDESONIDE 0.25 MG/2ML IN SUSP
0.2500 mg | Freq: Two times a day (BID) | RESPIRATORY_TRACT | Status: DC
  Administered 2022-07-17 – 2022-07-21 (×9): 0.25 mg via RESPIRATORY_TRACT
  Filled 2022-07-17 (×9): qty 2

## 2022-07-17 MED ORDER — HYDRALAZINE HCL 20 MG/ML IJ SOLN: 5.0000 mg | Freq: Four times a day (QID) | INTRAMUSCULAR | Status: DC | PRN

## 2022-07-17 MED ORDER — ASCORBIC ACID 500 MG PO TABS
1000.0000 mg | ORAL_TABLET | Freq: Every day | ORAL | Status: DC
  Administered 2022-07-17 – 2022-07-21 (×5): 1000 mg via ORAL
  Filled 2022-07-17 (×5): qty 2

## 2022-07-17 MED ORDER — UMECLIDINIUM BROMIDE 62.5 MCG/ACT IN AEPB
1.0000 | INHALATION_SPRAY | Freq: Every day | RESPIRATORY_TRACT | Status: DC
  Filled 2022-07-17: qty 7

## 2022-07-17 MED ORDER — IPRATROPIUM-ALBUTEROL 0.5-2.5 (3) MG/3ML IN SOLN
3.0000 mL | RESPIRATORY_TRACT | Status: DC
  Administered 2022-07-17 (×3): 3 mL via RESPIRATORY_TRACT
  Filled 2022-07-17 (×4): qty 3

## 2022-07-17 MED ORDER — GUAIFENESIN ER 600 MG PO TB12
1200.0000 mg | ORAL_TABLET | Freq: Two times a day (BID) | ORAL | Status: DC
  Administered 2022-07-17 – 2022-07-21 (×9): 1200 mg via ORAL
  Filled 2022-07-17 (×10): qty 2

## 2022-07-17 MED ORDER — SODIUM CHLORIDE 0.9 % IV SOLN
1.0000 g | INTRAVENOUS | Status: AC
  Administered 2022-07-17 – 2022-07-21 (×5): 1 g via INTRAVENOUS
  Filled 2022-07-17 (×5): qty 10

## 2022-07-17 MED ORDER — AZITHROMYCIN 250 MG PO TABS
500.0000 mg | ORAL_TABLET | Freq: Every day | ORAL | Status: DC
  Administered 2022-07-17 – 2022-07-19 (×3): 500 mg via ORAL
  Filled 2022-07-17 (×3): qty 2

## 2022-07-17 MED ORDER — ASPIRIN 81 MG PO TBEC
81.0000 mg | DELAYED_RELEASE_TABLET | Freq: Every day | ORAL | Status: DC
  Administered 2022-07-17 – 2022-07-21 (×5): 81 mg via ORAL
  Filled 2022-07-17 (×5): qty 1

## 2022-07-17 MED ORDER — INSULIN ASPART 100 UNIT/ML IJ SOLN
0.0000 [IU] | INTRAMUSCULAR | Status: DC
  Administered 2022-07-17: 2 [IU] via SUBCUTANEOUS
  Administered 2022-07-17 – 2022-07-21 (×12): 1 [IU] via SUBCUTANEOUS

## 2022-07-17 MED ORDER — PANTOPRAZOLE SODIUM 40 MG PO TBEC
40.0000 mg | DELAYED_RELEASE_TABLET | Freq: Two times a day (BID) | ORAL | Status: DC
  Administered 2022-07-17 – 2022-07-21 (×9): 40 mg via ORAL
  Filled 2022-07-17 (×9): qty 1

## 2022-07-17 MED ORDER — IPRATROPIUM-ALBUTEROL 0.5-2.5 (3) MG/3ML IN SOLN
3.0000 mL | Freq: Four times a day (QID) | RESPIRATORY_TRACT | Status: DC
  Administered 2022-07-18 (×3): 3 mL via RESPIRATORY_TRACT
  Filled 2022-07-17 (×4): qty 3

## 2022-07-17 MED ORDER — ALBUTEROL SULFATE (2.5 MG/3ML) 0.083% IN NEBU
2.5000 mg | INHALATION_SOLUTION | RESPIRATORY_TRACT | Status: DC | PRN
  Administered 2022-07-17: 2.5 mg via RESPIRATORY_TRACT

## 2022-07-17 MED ORDER — MOMETASONE FURO-FORMOTEROL FUM 200-5 MCG/ACT IN AERO
2.0000 | INHALATION_SPRAY | Freq: Two times a day (BID) | RESPIRATORY_TRACT | Status: DC
  Filled 2022-07-17: qty 8.8

## 2022-07-17 MED ORDER — ARFORMOTEROL TARTRATE 15 MCG/2ML IN NEBU
15.0000 ug | INHALATION_SOLUTION | Freq: Two times a day (BID) | RESPIRATORY_TRACT | Status: DC
  Administered 2022-07-17 – 2022-07-21 (×9): 15 ug via RESPIRATORY_TRACT
  Filled 2022-07-17 (×9): qty 2

## 2022-07-17 NOTE — Assessment & Plan Note (Signed)
1. COPD pathway 2. Empiric rocephin 3. Solumedrol '40mg'$  BID for the moment 4. Rescue BIPAP for the moment, wean as able 5. COVID and flu neg 6. Scheduled LABA/LAMA/INH steroid 7. PRN SABA

## 2022-07-17 NOTE — Progress Notes (Signed)
Pt took off BIPAP and placed on 2 LPM [place]. No WOB or distress noted at this time.

## 2022-07-17 NOTE — Assessment & Plan Note (Addendum)
Med rec pending Based on prior med rec though:  Doesn't appear to be on any rate control meds though  Not on [facility], looks like he had a watchman's procedure done. Tele monitor

## 2022-07-17 NOTE — Assessment & Plan Note (Addendum)
At baseline pt uses 2L at rest, 2-3L with exertion. Patient has acute respiratory failure with hypoxia due to having a new oxygen requirement.  That is the patient has a PaO2 < 60 (pulse Ox < 90%) on room air. Today patient is requiring rescue BIPAP to maintain sats. Cont pulse ox Wean BIPAP as able (failed weaning to room air with carelink immediately prior to transfer).

## 2022-07-17 NOTE — Progress Notes (Signed)
Nutrition Brief Note  Consult received for assessment of nutrition requirements and status per COPD protocol.  Wt Readings from Last 15 Encounters:  07/16/22 69.3 kg  05/28/22 69.5 kg  05/24/22 70.9 kg  05/13/22 70.8 kg  04/08/22 70.8 kg  03/19/22 69 kg  03/11/22 69.4 kg  03/06/22 67.1 kg  03/02/22 66.2 kg  02/15/22 66.7 kg  02/14/22 67 kg  02/13/22 67.5 kg  02/08/22 67.9 kg  01/31/22 66.2 kg  01/28/22 66.2 kg    Body mass index is 24.66 kg/m. Patient meets criteria for normal weight based on current BMI. Patient confirms weight has been stable.   Current diet order is Carb Modified; diet advanced from NPO at 1150. Lunch tray on bedside table with ~95% completion of the meal.   Patient sitting up in bed and wife at bedside. Patient denies any changes to appetite PTA. Patient and wife share that ~2 weeks ago they began a diet to eat healthier to aid in their overall wellness. They go to the gym 3 times/week on average.   They deny any nutrition-related questions, concerns, or needs at this time.   Labs and medications reviewed.   No nutrition interventions warranted at this time. If nutrition issues arise, please re-consult RD.       Jarome Matin, MS, RD, LDN, [place] Registered Dietitian II Inpatient Clinical Nutrition RD pager # and on-call/weekend pager # available in [facility]

## 2022-07-17 NOTE — TOC Initial Note (Signed)
Transition of Care [facility]) - Initial/Assessment Note   Patient Details  Name: Paul [name] MRN: [id number] Date of Birth: 1946-01-13  [facility] CM/SW Contact:    Sherie Don, LCSW Phone Number: 07/17/2022, 3:44 PM  Clinical Narrative: Per chart review, patient was previously set up with home O2 through [employer]. CSW followed up with patient's wife, Paul [name], and confirmed patient currently receives home O2 through [employer] and they brought the travel tank to the hospital for discharging home, which is half full. Wife stated the tank is full "enough to get him home." TOC awaiting PT and OT evaluations.  Expected Discharge Plan: Home/Self Care Barriers to Discharge: Continued Medical Work up  Patient Goals and CMS Choice Patient states their goals for this hospitalization and ongoing recovery are:: Return home Choice offered to / list presented to : NA  Expected Discharge Plan and Services Expected Discharge Plan: Home/Self Care In-house Referral: Clinical Social Work Post Acute Care Choice: NA Living arrangements for the past 2 months: Single Family Home           DME Arranged: N/A DME Agency: NA  Prior Living Arrangements/Services Living arrangements for the past 2 months: Single Family Home Lives with:: Spouse Patient language and need for interpreter reviewed:: Yes Do you feel safe going back to the place where you live?: Yes      Need for Family Participation in Patient Care: No (Comment) Care giver support system in place?: Yes (comment) Current home services: DME (Home O2 through [employer]) Criminal Activity/Legal Involvement Pertinent to Current Situation/Hospitalization: No - Comment as needed  Activities of Daily Living Home Assistive Devices/Equipment: Oxygen, Wheelchair, Environmental consultant (specify type), Shower chair with back (four wheel walker) ADL Screening (condition at time of admission) Patient's cognitive ability adequate to safely complete daily activities?:  Yes Is the patient deaf or have difficulty hearing?: No Does the patient have difficulty seeing, even when wearing glasses/contacts?: No Does the patient have difficulty concentrating, remembering, or making decisions?: No Patient able to express need for assistance with ADLs?: Yes Does the patient have difficulty dressing or bathing?: No Independently performs ADLs?: Yes (appropriate for developmental age) Does the patient have difficulty walking or climbing stairs?: Yes Weakness of Legs: None Weakness of Arms/Hands: None  Emotional Assessment Orientation: : Oriented to Self, Oriented to Place, Oriented to  Time, Oriented to Situation Alcohol / Substance Use: Not Applicable Psych Involvement: No (comment)  Admission diagnosis:  COPD exacerbation ([place]) [J44.1] COPD with acute exacerbation ([place]) [J44.1] Acute on chronic respiratory failure with hypoxia ([place]) [J96.21] Patient Active Problem List   Diagnosis Date Noted   Olecranon bursitis of right elbow 05/16/2022   Dizziness 04/08/2022   Syncope 04/08/2022   COPD with acute exacerbation ([place]) 03/02/2022   Type 2 diabetes mellitus ([place]) 03/02/2022   Diarrhea 02/14/2022   GERD (gastroesophageal reflux disease) / GI protection on antiplatelet therapy 12/27/2021   Lumbar back pain 11/17/2021   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   Oxygen dependent 10/09/2021   Paroxysmal atrial fibrillation ([place]) 08/02/2021   Chronic respiratory failure with hypoxia ([place]) 01/16/2021   GAD (generalized anxiety disorder) 01/14/2021   Chronic diastolic CHF (congestive heart failure) ([place]) 04/01/2020   Age-related osteoporosis with healing pathological fracture, chronic back pain 02/06/2020   Essential hypertension 01/28/2020   Encounter for general adult medical examination with abnormal findings 01/25/2020   Chronic bilateral thoracic back pain 01/25/2020   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[place]) 01/25/2020   HLD (hyperlipidemia)  01/05/2020   Chronic anemia 05/29/2019   Pulmonary nodules 05/29/2019   Coronary artery calcification 04/06/2019   Atherosclerosis of aorta ([place]) 04/06/2019   Acute on chronic respiratory failure with hypoxia [facility])    Former cigarette smoker 12/05/2015   COPD GOLD  III     PCP:  [name], MD Pharmacy:   [id number] [place], [place] - [address] [place] [id number]: [phone]: [phone] Readmission Risk Interventions    07/17/2022    3:42 PM 03/03/2022    1:33 PM 02/07/2022   12:58 PM  Readmission Risk Prevention Plan  Transportation Screening Complete Complete Complete  PCP or Specialist Appt within 3-5 Days  Complete Complete  HRI or Home Care Consult Complete Complete Complete  Social Work Consult for [place]/Counseling Complete Complete Complete  Palliative Care Screening Not Applicable Complete Complete  Medication Review (Press photographer  Complete Complete

## 2022-07-17 NOTE — H&P (Signed)
History and Physical    Patient: Paul [name] UXN:[id number] DOB: 11-26-1946 DOA: 07/16/2022 DOS: the patient was seen and examined on 07/17/2022 PCP: [name], MD  Patient coming from: Home  Chief Complaint:  Chief Complaint  Patient presents with   Shortness of Breath   HPI: Paul [name] is a 76 y.o. male with medical history significant of COPD, DM2, HTN, 2L home O2 at baseline.  Pt presents to the ED with c/o SOB.  Symptoms onset over the past 1 day, persistent, severe.  Not improved with home nebs.  Pt came in to ED.  Pt satting 70s on 3L via [place], put on rescue BIPAP, multiple neb treatments and solumedrol.  Now satting 100% but on BIPAP 12/6 FIO2 40%.  Pt transferred to [facility] SDU for further care.   Review of Systems: As mentioned in the history of present illness. All other systems reviewed and are negative. Past Medical History:  Diagnosis Date   Anemia    Aortic atherosclerosis (HCC)    Aspiration pneumonia due to inhalation of vomitus ([place]) 02/03/2022   Atrial fibrillation (HCC)    Back pain    COPD (chronic obstructive pulmonary disease) (HCC)    COPD exacerbation ([place]) 12/27/2021   COVID-19 12/24/2020   Diabetes mellitus without complication (HCC)    Elevated PSA    GAD (generalized anxiety disorder)    GERD (gastroesophageal reflux disease)    High cholesterol    Hypertension    Lingular pneumonia 01/05/2021   See cxr 01/06/20 rx with zpak/ cefipime x 5 days and then developed purulent sputum again 01/12/21 so rx levcaquin 500 mg daily x 7 days then return for cxr before more    Lung nodule < 6cm on CT 05/29/2016   Osteoporosis    Oxygen deficiency    Pneumonia    January 2022   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 Quentin Ore with a 20 mm Watchman FLX device   RSV bronchitis 01/27/2022   Vertebral compression fracture ([place]) 05/29/2019   T8 compression fracture noted on CT scan 05/28/2019 inpatient with chronic steroid  dependent COPD - rx calcitonin nasal spray rx per PCP    Past Surgical History:  Procedure Laterality Date   CATARACT EXTRACTION Right    IR KYPHO EA ADDL LEVEL THORACIC OR LUMBAR  11/28/2021   LEFT ATRIAL APPENDAGE OCCLUSION N/A 08/02/2021   Procedure: LEFT ATRIAL APPENDAGE OCCLUSION;  Surgeon: Sherren Mocha, MD;  Location: [facility];  Service: Cardiovascular;  Laterality: N/A;   SKIN SURGERY     shoulders and chest   TEE WITHOUT CARDIOVERSION N/A 08/02/2021   Procedure: TRANSESOPHAGEAL ECHOCARDIOGRAM (TEE);  Surgeon: Sherren Mocha, MD;  Location: [facility];  Service: Cardiovascular;  Laterality: N/A;   TEE WITHOUT CARDIOVERSION N/A 09/13/2021   Procedure: TRANSESOPHAGEAL ECHOCARDIOGRAM (TEE);  Surgeon: Sanda Klein, MD;  Location: [facility];  Service: Cardiovascular;  Laterality: N/A;   Social History:  reports that he quit smoking about 13 years ago. His smoking use included cigarettes. He has a 104.00 pack-year smoking history. He has never used smokeless tobacco. He reports that he does not currently use alcohol. He reports that he does not use drugs.  Allergies  Allergen Reactions   Tape Other (See Comments)    PLEASE USE COBAN WRAP IN LIEU OF "TAPE," as it "pulls off the skin!!   Daliresp [Roflumilast] Diarrhea and Nausea And Vomiting   Levaquin [Levofloxacin] Palpitations  and Other (See Comments)    Made the B/P fluctuate and heart raced   Alendronate Anxiety and Other (See Comments)    Jittery/nervous    Family History  Problem Relation Age of Onset   Heart disease Brother    Heart disease Mother    Heart disease Father    Cancer Paternal Uncle     Prior to Admission medications   Medication Sig Start Date End Date Taking? Authorizing Provider  acetaminophen (TYLENOL) 325 MG tablet Take 650 mg by mouth every 6 (six) hours as needed for mild pain or fever.    [provider]  albuterol (PROAIR HFA) 108 (90 Base) MCG/ACT inhaler Inhale 2  puffs into the lungs every 6 (six) hours as needed for wheezing or shortness of breath.     [provider]  albuterol (PROVENTIL) (2.5 MG/3ML) 0.083% nebulizer solution Take 3 mLs (2.5 mg total) by nebulization every 6 (six) hours as needed for shortness of breath or wheezing. 02/11/22   Cox, Elnita Maxwell, MD  ALPRAZolam Duanne Moron) 0.25 MG tablet Take 0.25 mg by mouth 2 (two) times daily as needed for anxiety.    [provider]  Ascorbic Acid (VITAMIN C) 1000 MG tablet Take 1,000 mg by mouth daily.    [provider]  aspirin EC 81 MG tablet Take 1 tablet (81 mg total) by mouth daily. Swallow whole. 01/28/22   Tommie Raymond, NP  atorvastatin (LIPITOR) 40 MG tablet TAKE 1 TABLET BY MOUTH EVERYDAY AT BEDTIME 05/31/22   Cox, Kirsten, MD  Budeson-Glycopyrrol-Formoterol (BREZTRI AEROSPHERE) 160-9-4.8 MCG/ACT AERO Inhale 2 puffs into the lungs 2 (two) times daily. 03/26/21   Cox, Elnita Maxwell, MD  calcium carbonate (OS-CAL) 600 MG TABS tablet Take 600 mg by mouth daily.    [provider]  Cholecalciferol (VITAMIN D3) 50 MCG (2000 UT) TABS Take 2,000 Units by mouth in the morning.    [provider]  denosumab (PROLIA) 60 MG/ML SOSY injection Inject 60 mg into the skin every 6 (six) months.    [provider]  ferrous sulfate 325 (65 FE) MG tablet Take 325 mg by mouth 2 (two) times daily with a meal.    [provider]  guaiFENesin (MUCINEX) 600 MG 12 hr tablet Take 600 mg by mouth in the morning and at bedtime.    [provider]  loratadine (CLARITIN) 10 MG tablet Take 10 mg by mouth in the morning.    [provider]  LORazepam (ATIVAN) 0.5 MG tablet Take 0.5 mg by mouth daily as needed for anxiety.    [provider]  Multiple Vitamin (MULTIVITAMIN WITH MINERALS) TABS tablet Take 1 tablet by mouth daily with breakfast.    [provider]  OXYGEN Inhale 2-3 L/min into the lungs See admin instructions. Inhale 2-3 L/min  into the lungs at bedtime and when ambulatory    [provider]  predniSONE (DELTASONE) 20 MG tablet Take 17 mg by mouth daily with breakfast. 7.5 mg daily    [provider]  predniSONE (DELTASONE) 5 MG tablet Take 1 tablet (5 mg total) by mouth daily with breakfast. 07/03/22   Tanda Rockers, MD  Probiotic Product (PROBIOTIC DAILY) CAPS Take 1 capsule by mouth in the morning.    [provider]  sertraline (ZOLOFT) 50 MG tablet TAKE 1 TABLET BY MOUTH EVERY DAY 06/21/22   [name], MD    Physical Exam: Vitals:   07/17/22 0000 07/17/22 0100 07/17/22 0200 07/17/22 0300  BP: 133/68 127/67 (!) 131/57 (!) 138/57  Pulse: 89 87 84 81  Resp: '18 20 19 19  '$ Temp:    97.9 F (36.6 C)  TempSrc:    Axillary  SpO2: 100% 99% 100% 100%  Weight:      Height:       Constitutional: NAD, calm, comfortable, on BIPAP Eyes: PERRL, lids and conjunctivae normal ENMT: Mucous membranes are moist. Posterior pharynx clear of any exudate or lesions.Normal dentition.  Neck: normal, supple, no masses, no thyromegaly Respiratory: Scattered wheezes present, breathing comfortably on a BIPAP though. Cardiovascular: Regular rate and rhythm, no murmurs / rubs / gallops. No extremity edema. 2+ pedal pulses. No carotid bruits.  Abdomen: no tenderness, no masses palpated. No hepatosplenomegaly. Bowel sounds positive.  Musculoskeletal: no clubbing / cyanosis. No joint deformity upper and lower extremities. Good ROM, no contractures. Normal muscle tone.  Skin: no rashes, lesions, ulcers. No induration Neurologic: CN 2-12 grossly intact. Sensation intact, DTR normal. Strength 5/5 in all 4.  Psychiatric: Normal judgment and insight. Alert and oriented x 3. Normal mood.   Data Reviewed:    CBC    Component Value Date/Time   WBC 12.2 (H) 07/16/2022 1320   RBC 5.29 07/16/2022 1320   HGB 14.3 07/16/2022 1340   HGB 11.9 (L) 04/08/2022 1421   HCT 42.0 07/16/2022 1340   HCT 37.7 04/08/2022 1421    PLT 269 07/16/2022 1320   PLT 316 04/08/2022 1421   MCV 81.1 07/16/2022 1320   MCV 82 04/08/2022 1421   MCH 24.8 (L) 07/16/2022 1320   MCHC 30.5 07/16/2022 1320   RDW 14.7 07/16/2022 1320   RDW 14.5 04/08/2022 1421   LYMPHSABS 0.3 (L) 07/16/2022 1320   LYMPHSABS 1.7 04/08/2022 1421   MONOABS 0.3 07/16/2022 1320   EOSABS 0.0 07/16/2022 1320   EOSABS 0.0 04/08/2022 1421   BASOSABS 0.0 07/16/2022 1320   BASOSABS 0.0 04/08/2022 1421   CMP     Component Value Date/Time   NA 134 (L) 07/16/2022 1340   NA 145 (H) 04/08/2022 1421   K 4.5 07/16/2022 1340   CL 99 07/16/2022 1320   CO2 21 (L) 07/16/2022 1320   GLUCOSE 200 (H) 07/16/2022 1320   BUN 10 07/16/2022 1320   BUN 19 04/08/2022 1421   CREATININE 1.01 07/16/2022 1320   CALCIUM 9.4 07/16/2022 1320   PROT 7.3 07/16/2022 1320   PROT 6.5 04/08/2022 1421   ALBUMIN 4.5 07/16/2022 1320   ALBUMIN 4.1 04/08/2022 1421   AST 27 07/16/2022 1320   ALT 20 07/16/2022 1320   ALKPHOS 74 07/16/2022 1320   BILITOT 0.3 07/16/2022 1320   BILITOT 0.2 04/08/2022 1421   GFRNONAA >60 07/16/2022 1320   GFRAA 107 11/20/2020 1202   CXR = no acute findings  COVID and flu are neg.   Assessment and Plan: * COPD with acute exacerbation ([place]) COPD pathway Empiric rocephin Solumedrol '40mg'$  BID for the moment Rescue BIPAP for the moment, wean as able COVID and flu neg Scheduled LABA/LAMA/INH steroid PRN SABA  Acute on chronic respiratory failure with hypoxia ([place]) At baseline pt uses 2L at rest, 2-3L with exertion. Patient has acute respiratory failure with hypoxia due to having a new oxygen requirement.  That is the patient has a PaO2 < 60 (pulse Ox < 90%) on room air. Today patient is requiring rescue BIPAP to maintain sats. Cont pulse ox Wean BIPAP as able (failed weaning to room air with carelink immediately prior to transfer).  Paroxysmal  atrial fibrillation ([place]) Med rec pending Based on prior med rec though:  Doesn't appear to be  on any rate control meds though  Not on [facility], looks like he had a watchman's procedure done. Tele monitor  Essential hypertension Med rec pending. Use PRN IV med if needed for the moment since NPO on BIPAP.  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[place]) Med rec pending dont see any chronic DM meds on home med list from priors Sensitive SSI Q4H for the moment while NPO (rescue BIPAP).  HLD (hyperlipidemia) Med rec pending. Holding statin for the moment until able to get off of BIPAP and take POs      Advance Care Planning:   Code Status: Full Code  Consults: None  Family Communication: No family in room  Severity of Illness: The appropriate patient status for this patient is INPATIENT. Inpatient status is judged to be reasonable and necessary in order to provide the required intensity of service to ensure the patient's safety. The patient's presenting symptoms, physical exam findings, and initial radiographic and laboratory data in the context of their chronic comorbidities is felt to place them at high risk for further clinical deterioration. Furthermore, it is not anticipated that the patient will be medically stable for discharge from the hospital within 2 midnights of admission.   * I certify that at the point of admission it is my clinical judgment that the patient will require inpatient hospital care spanning beyond 2 midnights from the point of admission due to high intensity of service, high risk for further deterioration and high frequency of surveillance required.*  Author: Etta Quill., DO 07/17/2022 3:21 AM  For on call review [email or web address].

## 2022-07-17 NOTE — Assessment & Plan Note (Signed)
Med rec pending. Holding statin for the moment until able to get off of BIPAP and take POs

## 2022-07-17 NOTE — Assessment & Plan Note (Signed)
Med rec pending dont see any chronic DM meds on home med list from priors 1. Sensitive SSI Q4H for the moment while NPO (rescue BIPAP).

## 2022-07-17 NOTE — Assessment & Plan Note (Signed)
Med rec pending. Use PRN IV med if needed for the moment since NPO on BIPAP.

## 2022-07-17 NOTE — Progress Notes (Signed)
OT Cancellation Note  Patient Details Name: Paul [name] MRN: [id number] DOB: 04-16-1946   Cancelled Treatment:    Reason Eval/Treat Not Completed: Medical issues which prohibited therapy Patient is on BiPAP. OT to continue to follow and check back as schedule will allow.  Jackelyn Poling OTR/L, [facility] Office# [phone] Pager# [phone]  07/17/2022, 9:02 AM

## 2022-07-17 NOTE — Progress Notes (Signed)
PT Cancellation Note  Patient Details Name: Paul [name] MRN: [id number] DOB: [birth year]   Cancelled Treatment:    Reason Eval/Treat Not Completed: Patient not medically ready,  currently on BiPAP.  [place] Office [phone] Weekend pager-[phone]  Claretha Cooper 07/17/2022, 9:03 AM

## 2022-07-17 NOTE — Progress Notes (Signed)
PROGRESS NOTE    Paul [name]  NOM:[id number] DOB: Nov 29, 1946 DOA: 07/16/2022 PCP: [name], MD   Brief Narrative: 76 year old with past medical history significant for COPD on chronic 2 L of oxygen at baseline, diabetes type 2, hypertension presents complaining of worsening shortness of breath that is started the day prior to admission, persistent.  Did not improve with nebulizer use at home.  Patient was found to be saturating 70% on 3 L of nasal cannula at rescue on BiPAP.  He received multiple nebulizer treatments and IV Solu-Medrol.  Patient admitted with acute on chronic hypoxic respiratory failure in the setting of COPD exacerbation.  Admitted on BiPAP.   Assessment & Plan:   Principal Problem:   COPD with acute exacerbation ([place]) Active Problems:   COPD GOLD  III    Acute on chronic respiratory failure with hypoxia (HCC)   HLD (hyperlipidemia)   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HCC)   Essential hypertension   Paroxysmal atrial fibrillation (HCC)  1-Acute on chronic hypoxic respiratory failure; Secondary to acute on chronic COPD exacerbation -Patient baseline on 2 L of oxygen, 2 and 3 L with exertion. -He presented with worsening shortness of breath, cough, found to be more hypoxic oxygen saturation 70% on 3 L. -Placed on BiPAP. -Continue with IV ceftriaxone, I will add azithromycin. -Scheduled DuoNeb every 4 hours. -Change Dulera to Brovana and Pulmicort -Plan to see how he does off BiPAP.  Continue with BiPAP as needed. Continue with IVF steroids  2-Paroxysmal A-fib: Not on beta-blocker or anticoagulation at home, no other anticoagulation due to poor candidacy for long-term anticoagulation due to history of severe bleeding secondary to hematuria, falls and skin tears status post LAAO with watchman 80/10/2021. Bisoprolol discontinue by his cardiologist due to dizziness.   Hypertension: Bisoprolol discontinue by his cardiologist due to dizziness.  As  needed hydralazine. Blood pressure well controlled.  Chronic diastolic heart failure: Appears euvolemic.  His Lasix was discontinued by his cardiology due to dizziness.  He was advised to take Lasix if his weight increases more than 3 pounds 24 hours  Diabetes type 2 with hyperlipidemia On SSI.    Hyperlipidemia: Resume Lipitor.  Depression/anxiety: Continue with Zoloft, and alprazolam  Estimated body mass index is 24.66 kg/m as calculated from the following:   Height as of this encounter: '5\' 6"'$  (1.676 m).   Weight as of this encounter: 69.3 kg.   DVT prophylaxis: Lovenox Code Status: Full code Family Communication: care discussed with patient.  Disposition Plan:  Status is: Inpatient Remains inpatient appropriate because: Management of acute hypoxic respiratory failure and COPD exacerbation    Consultants:  None  Procedures:  None  Antimicrobials:    Subjective: He is on BiPAP, he reports that he will try to see how he does of BiPAP.  Report shortness of breath is on and off.  But he feels better this morning.   Objective: Vitals:   07/17/22 0419 07/17/22 0500 07/17/22 0600 07/17/22 0700  BP: (!) 128/59 (!) 130/55 (!) 127/52 139/77  Pulse: 84  77 86  Resp: '19 19 19 '$ (!) 22  Temp:      TempSrc:      SpO2: 100%  99% 100%  Weight:      Height:        Intake/Output Summary (Last 24 hours) at 07/17/2022 0715 Last data filed at 07/17/2022 0640 Gross per 24 hour  Intake 228.87 ml  Output 375 ml  Net -146.13 ml  Filed Weights   07/16/22 1304  Weight: 69.3 kg    Examination:  General exam: Appears calm and comfortable  Respiratory system: bL [place]. On BIPAP  Cardiovascular system: S1 & S2 heard, RRR. N Gastrointestinal system: Abdomen is nondistended, soft and nontender. No organomegaly or masses felt. Normal bowel sounds heard. Central nervous system: Alert and oriented. No focal neurological deficits. Extremities: Symmetric 5 x 5 power. Skin: No  rashes, lesions or ulcers    Data Reviewed: I have personally reviewed following labs and imaging studies  CBC: Recent Labs  Lab 07/16/22 1320 07/16/22 1340 07/17/22 0306  WBC 12.2*  --  8.1  NEUTROABS 11.5*  --   --   HGB 13.1 14.3 11.7*  HCT 42.9 42.0 38.5*  MCV 81.1  --  81.7  PLT 269  --  810   Basic Metabolic Panel: Recent Labs  Lab 07/16/22 1320 07/16/22 1340 07/17/22 0306  NA 134* 134* 138  K 4.3 4.5 4.7  CL 99  --  105  CO2 21*  --  24  GLUCOSE 200*  --  149*  BUN 10  --  12  CREATININE 1.01  --  0.84  CALCIUM 9.4  --  8.5*   GFR: Estimated Creatinine Clearance: 66.5 mL/min (by C-G formula based on SCr of 0.84 mg/dL). Liver Function Tests: Recent Labs  Lab 07/16/22 1320  AST 27  ALT 20  ALKPHOS 74  BILITOT 0.3  PROT 7.3  ALBUMIN 4.5   No results for input(s): "LIPASE", "AMYLASE" in the last 168 hours. No results for input(s): "AMMONIA" in the last 168 hours. Coagulation Profile: No results for input(s): "INR", "PROTIME" in the last 168 hours. Cardiac Enzymes: No results for input(s): "CKTOTAL", "CKMB", "CKMBINDEX", "TROPONINI" in the last 168 hours. BNP (last 3 results) No results for input(s): "PROBNP" in the last 8760 hours. HbA1C: No results for input(s): "HGBA1C" in the last 72 hours. CBG: Recent Labs  Lab 07/17/22 0305  GLUCAP 144*   Lipid Profile: No results for input(s): "CHOL", "HDL", "LDLCALC", "TRIG", "CHOLHDL", "LDLDIRECT" in the last 72 hours. Thyroid Function Tests: No results for input(s): "TSH", "T4TOTAL", "FREET4", "T3FREE", "THYROIDAB" in the last 72 hours. Anemia Panel: No results for input(s): "VITAMINB12", "FOLATE", "FERRITIN", "TIBC", "IRON", "RETICCTPCT" in the last 72 hours. Sepsis Labs: No results for input(s): "PROCALCITON", "LATICACIDVEN" in the last 168 hours.  Recent Results (from the past 240 hour(s))  Resp Panel by RT-PCR (Flu A&B, Covid) Anterior Nasal Swab     Status: None   Collection Time: 07/16/22   4:46 PM   Specimen: Anterior Nasal Swab  Result Value Ref Range Status   SARS Coronavirus 2 by RT PCR NEGATIVE NEGATIVE Final    Comment: (NOTE) SARS-CoV-2 target nucleic acids are NOT DETECTED.  The SARS-CoV-2 RNA is generally detectable in upper respiratory specimens during the acute phase of infection. The lowest concentration of SARS-CoV-2 viral copies this assay can detect is 138 copies/mL. A negative result does not preclude SARS-Cov-2 infection and should not be used as the sole basis for treatment or other patient management decisions. A negative result may occur with  improper specimen collection/handling, submission of specimen other than nasopharyngeal swab, presence of viral mutation(s) within the areas targeted by this assay, and inadequate number of viral copies(<138 copies/mL). A negative result must be combined with clinical observations, patient history, and epidemiological information. The expected result is Negative.  Fact Sheet for Patients:  EntrepreneurPulse.com.au  Fact Sheet for Healthcare Providers:  IncredibleEmployment.be  This test is no t yet approved or cleared by the [place] and  has been authorized for detection and/or diagnosis of SARS-CoV-2 by FDA under an Emergency Use Authorization (EUA). This EUA will remain  in effect (meaning this test can be used) for the duration of the COVID-19 declaration under Section 564(b)(1) of the Act, 21 U.S.C.section 360bbb-3(b)(1), unless the authorization is terminated  or revoked sooner.       Influenza A by PCR NEGATIVE NEGATIVE Final   Influenza B by PCR NEGATIVE NEGATIVE Final    Comment: (NOTE) The Xpert Xpress SARS-CoV-2/FLU/RSV plus assay is intended as an aid in the diagnosis of influenza from Nasopharyngeal swab specimens and should not be used as a sole basis for treatment. Nasal washings and aspirates are unacceptable for Xpert Xpress  SARS-CoV-2/FLU/RSV testing.  Fact Sheet for Patients: EntrepreneurPulse.com.au  Fact Sheet for Healthcare Providers: IncredibleEmployment.be  This test is not yet approved or cleared by the [place] FDA and has been authorized for detection and/or diagnosis of SARS-CoV-2 by FDA under an Emergency Use Authorization (EUA). This EUA will remain in effect (meaning this test can be used) for the duration of the COVID-19 declaration under Section 564(b)(1) of the Act, 21 U.S.C. section 360bbb-3(b)(1), unless the authorization is terminated or revoked.  Performed at [facility], [address] [place], [place] [address]   MRSA Next Gen by PCR, Nasal     Status: None   Collection Time: 07/16/22 10:53 PM   Specimen: Nasal Mucosa; Nasal Swab  Result Value Ref Range Status   MRSA by PCR Next Gen NOT DETECTED NOT DETECTED Final    Comment: (NOTE) The GeneXpert MRSA Assay (FDA approved for NASAL specimens only), is one component of a comprehensive MRSA colonization surveillance program. It is not intended to diagnose MRSA infection nor to guide or monitor treatment for MRSA infections. Test performance is not FDA approved in patients less than 24 years old. Performed at [facility], [place] [address], [place], [place] [address]          Radiology Studies: [facility] Chest Port 1 View  Result Date: 07/16/2022 CLINICAL DATA:  Shortness of breath EXAM: PORTABLE CHEST 1 VIEW COMPARISON:  Chest x-ray dated March 02, 2022 FINDINGS: The heart size and mediastinal contours are within normal limits. Both lungs are clear. The visualized skeletal structures are unremarkable. IMPRESSION: No active disease. Electronically Signed   By: Yetta Glassman M.D.   On: 07/16/2022 13:29        Scheduled Meds:  Chlorhexidine Gluconate Cloth  6 each Topical Daily   enoxaparin (LOVENOX) injection  40 mg Subcutaneous Q24H   insulin aspart   0-9 Units Subcutaneous Q4H   methylPREDNISolone (SOLU-MEDROL) injection  40 mg Intravenous Q12H   mometasone-formoterol  2 puff Inhalation BID   mouth rinse  15 mL Mouth Rinse 4 times per day   umeclidinium bromide  1 puff Inhalation Daily   Continuous Infusions:  cefTRIAXone (ROCEPHIN)  IV Stopped (07/17/22 0328)   lactated ringers 75 mL/hr at 07/17/22 0400     LOS: 1 day    Time spent: 35 minutes.    Elmarie Shiley, MD [facility]   If 7PM-7AM, please contact night-coverage [email or web address]  07/17/2022, 7:15 AM

## 2022-07-18 DIAGNOSIS — J441 Chronic obstructive pulmonary disease with (acute) exacerbation: Secondary | ICD-10-CM | POA: Diagnosis not present

## 2022-07-18 LAB — GLUCOSE, CAPILLARY
Glucose-Capillary: 136 mg/dL — ABNORMAL HIGH (ref 70–99)
Glucose-Capillary: 123 mg/dL — ABNORMAL HIGH (ref 70–99)
Glucose-Capillary: 94 mg/dL (ref 70–99)
Glucose-Capillary: 120 mg/dL — ABNORMAL HIGH (ref 70–99)
Glucose-Capillary: 140 mg/dL — ABNORMAL HIGH (ref 70–99)

## 2022-07-18 MED ORDER — IPRATROPIUM-ALBUTEROL 0.5-2.5 (3) MG/3ML IN SOLN
3.0000 mL | Freq: Three times a day (TID) | RESPIRATORY_TRACT | Status: DC
  Administered 2022-07-18 – 2022-07-21 (×8): 3 mL via RESPIRATORY_TRACT
  Filled 2022-07-18 (×7): qty 3

## 2022-07-18 NOTE — Progress Notes (Signed)
PROGRESS NOTE    Paul [name]  GQQ:[id number] DOB: 05/15/1946 DOA: 07/16/2022 PCP: [name], MD   Brief Narrative: 76 year old with past medical history significant for COPD on chronic 2 L of oxygen at baseline, diabetes type 2, hypertension presents complaining of worsening shortness of breath that is started the day prior to admission, persistent.  Did not improve with nebulizer use at home.  Patient was found to be saturating 70% on 3 L of nasal cannula at rescue on BiPAP.  He received multiple nebulizer treatments and IV Solu-Medrol.  Patient admitted with acute on chronic hypoxic respiratory failure in the setting of COPD exacerbation.  Admitted on BiPAP.   Assessment & Plan:   Principal Problem:   COPD with acute exacerbation ([place]) Active Problems:   COPD GOLD  III    Acute on chronic respiratory failure with hypoxia (HCC)   HLD (hyperlipidemia)   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HCC)   Essential hypertension   Paroxysmal atrial fibrillation (HCC)  1-Acute on chronic hypoxic respiratory failure; Secondary to acute on chronic COPD exacerbation -Patient baseline on 2 L of oxygen, 2 and 3 L with exertion. -He presented with worsening shortness of breath, cough, found to be more hypoxic oxygen saturation 70% on 3 L. -Placed on BiPAP. Currently off BIPAP. He has not required BIPAP since yesterday morning.  -Continue with IV ceftriaxone, azithromycin. -Scheduled DuoNeb every 4 hours. -Continue with  Brovana and Pulmicort Continue with IVF steroids Transfer to med surgery, PT evaluation. Still with BL wheezing ronchus. Continue with IV steroids.  Flutter valve.   2-Paroxysmal A-fib: Not on beta-blocker or anticoagulation at home, no other anticoagulation due to poor candidacy for long-term anticoagulation due to history of severe bleeding secondary to hematuria, falls and skin tears status post LAAO with watchman 80/10/2021. Bisoprolol discontinue by his  cardiologist due to dizziness.   Hypertension: Bisoprolol discontinue by his cardiologist due to dizziness.  As needed hydralazine. Blood pressure well controlled.  Chronic diastolic heart failure: Appears euvolemic.  His Lasix was discontinued by his cardiology due to dizziness.  He was advised to take Lasix if his weight increases more than 3 pounds 24 hours Daily weight.   Diabetes type 2 with hyperlipidemia On SSI.    Hyperlipidemia: Continue with  Lipitor.  Depression/anxiety: Continue with Zoloft, and alprazolam  Estimated body mass index is 24.66 kg/m as calculated from the following:   Height as of this encounter: '5\' 6"'$  (1.676 m).   Weight as of this encounter: 69.3 kg.   DVT prophylaxis: Lovenox Code Status: Full code Family Communication: care discussed with patient.  Disposition Plan:  Status is: Inpatient Remains inpatient appropriate because: Management of acute hypoxic respiratory failure and COPD exacerbation    Consultants:  None  Procedures:  None  Antimicrobials:    Subjective: He is breathing better, not at baseline. Still having SOB with minimal ambulation.  He has been off BIPAP since yesterday morning.   Objective: Vitals:   07/18/22 0600 07/18/22 0758 07/18/22 0802 07/18/22 0826  BP: 131/67   (!) 154/64  Pulse: 79   (!) 103  Resp: 14   (!) 22  Temp:   (!) 97 F (36.1 C)   TempSrc:   Axillary   SpO2: 91% 99% 95% (!) 86%  Weight:      Height:        Intake/Output Summary (Last 24 hours) at 07/18/2022 0909 Last data filed at 07/18/2022 0835 Gross per 24 hour  Intake  1020 ml  Output 600 ml  Net 420 ml    Filed Weights   07/16/22 1304  Weight: 69.3 kg    Examination:  General exam: NAD Respiratory system: BL wheezing.  Cardiovascular system: S 1, S 2 RRR Gastrointestinal system: BS present, soft Central nervous system: Alert, and oriented.  Extremities: no edema   Data Reviewed: I have personally reviewed following  labs and imaging studies  CBC: Recent Labs  Lab 07/16/22 1320 07/16/22 1340 07/17/22 0306  WBC 12.2*  --  8.1  NEUTROABS 11.5*  --   --   HGB 13.1 14.3 11.7*  HCT 42.9 42.0 38.5*  MCV 81.1  --  81.7  PLT 269  --  161    Basic Metabolic Panel: Recent Labs  Lab 07/16/22 1320 07/16/22 1340 07/17/22 0306  NA 134* 134* 138  K 4.3 4.5 4.7  CL 99  --  105  CO2 21*  --  24  GLUCOSE 200*  --  149*  BUN 10  --  12  CREATININE 1.01  --  0.84  CALCIUM 9.4  --  8.5*    GFR: Estimated Creatinine Clearance: 66.5 mL/min (by C-G formula based on SCr of 0.84 mg/dL). Liver Function Tests: Recent Labs  Lab 07/16/22 1320  AST 27  ALT 20  ALKPHOS 74  BILITOT 0.3  PROT 7.3  ALBUMIN 4.5    No results for input(s): "LIPASE", "AMYLASE" in the last 168 hours. No results for input(s): "AMMONIA" in the last 168 hours. Coagulation Profile: No results for input(s): "INR", "PROTIME" in the last 168 hours. Cardiac Enzymes: No results for input(s): "CKTOTAL", "CKMB", "CKMBINDEX", "TROPONINI" in the last 168 hours. BNP (last 3 results) No results for input(s): "PROBNP" in the last 8760 hours. HbA1C: No results for input(s): "HGBA1C" in the last 72 hours. CBG: Recent Labs  Lab 07/17/22 1607 07/17/22 1953 07/17/22 2358 07/18/22 0338 07/18/22 0810  GLUCAP 152* 139* 112* 136* 123*    Lipid Profile: No results for input(s): "CHOL", "HDL", "LDLCALC", "TRIG", "CHOLHDL", "LDLDIRECT" in the last 72 hours. Thyroid Function Tests: No results for input(s): "TSH", "T4TOTAL", "FREET4", "T3FREE", "THYROIDAB" in the last 72 hours. Anemia Panel: No results for input(s): "VITAMINB12", "FOLATE", "FERRITIN", "TIBC", "IRON", "RETICCTPCT" in the last 72 hours. Sepsis Labs: No results for input(s): "PROCALCITON", "LATICACIDVEN" in the last 168 hours.  Recent Results (from the past 240 hour(s))  Resp Panel by RT-PCR (Flu A&B, Covid) Anterior Nasal Swab     Status: None   Collection Time: 07/16/22   4:46 PM   Specimen: Anterior Nasal Swab  Result Value Ref Range Status   SARS Coronavirus 2 by RT PCR NEGATIVE NEGATIVE Final    Comment: (NOTE) SARS-CoV-2 target nucleic acids are NOT DETECTED.  The SARS-CoV-2 RNA is generally detectable in upper respiratory specimens during the acute phase of infection. The lowest concentration of SARS-CoV-2 viral copies this assay can detect is 138 copies/mL. A negative result does not preclude SARS-Cov-2 infection and should not be used as the sole basis for treatment or other patient management decisions. A negative result may occur with  improper specimen collection/handling, submission of specimen other than nasopharyngeal swab, presence of viral mutation(s) within the areas targeted by this assay, and inadequate number of viral copies(<138 copies/mL). A negative result must be combined with clinical observations, patient history, and epidemiological information. The expected result is Negative.  Fact Sheet for Patients:  EntrepreneurPulse.com.au  Fact Sheet for Healthcare Providers:  IncredibleEmployment.be  This test is  no t yet approved or cleared by the [place] and  has been authorized for detection and/or diagnosis of SARS-CoV-2 by FDA under an Emergency Use Authorization (EUA). This EUA will remain  in effect (meaning this test can be used) for the duration of the COVID-19 declaration under Section 564(b)(1) of the Act, 21 U.S.C.section 360bbb-3(b)(1), unless the authorization is terminated  or revoked sooner.       Influenza A by PCR NEGATIVE NEGATIVE Final   Influenza B by PCR NEGATIVE NEGATIVE Final    Comment: (NOTE) The Xpert Xpress SARS-CoV-2/FLU/RSV plus assay is intended as an aid in the diagnosis of influenza from Nasopharyngeal swab specimens and should not be used as a sole basis for treatment. Nasal washings and aspirates are unacceptable for Xpert Xpress  SARS-CoV-2/FLU/RSV testing.  Fact Sheet for Patients: EntrepreneurPulse.com.au  Fact Sheet for Healthcare Providers: IncredibleEmployment.be  This test is not yet approved or cleared by the [place] FDA and has been authorized for detection and/or diagnosis of SARS-CoV-2 by FDA under an Emergency Use Authorization (EUA). This EUA will remain in effect (meaning this test can be used) for the duration of the COVID-19 declaration under Section 564(b)(1) of the Act, 21 U.S.C. section 360bbb-3(b)(1), unless the authorization is terminated or revoked.  Performed at [facility], [address] [place], [place] [address]   MRSA Next Gen by PCR, Nasal     Status: None   Collection Time: 07/16/22 10:53 PM   Specimen: Nasal Mucosa; Nasal Swab  Result Value Ref Range Status   MRSA by PCR Next Gen NOT DETECTED NOT DETECTED Final    Comment: (NOTE) The GeneXpert MRSA Assay (FDA approved for NASAL specimens only), is one component of a comprehensive MRSA colonization surveillance program. It is not intended to diagnose MRSA infection nor to guide or monitor treatment for MRSA infections. Test performance is not FDA approved in patients less than 6 years old. Performed at [facility], [place] [address] [place], [place] [address]   Respiratory (~20 pathogens) panel by PCR     Status: None   Collection Time: 07/17/22  9:33 AM   Specimen: Nasopharyngeal Swab; Respiratory  Result Value Ref Range Status   Adenovirus NOT DETECTED NOT DETECTED Final   Coronavirus 229E NOT DETECTED NOT DETECTED Final    Comment: (NOTE) The Coronavirus on the Respiratory Panel, DOES NOT test for the novel  Coronavirus (2019 nCoV)    Coronavirus HKU1 NOT DETECTED NOT DETECTED Final   Coronavirus NL63 NOT DETECTED NOT DETECTED Final   Coronavirus OC43 NOT DETECTED NOT DETECTED Final   Metapneumovirus NOT DETECTED NOT DETECTED Final    Rhinovirus / Enterovirus NOT DETECTED NOT DETECTED Final   Influenza A NOT DETECTED NOT DETECTED Final   Influenza B NOT DETECTED NOT DETECTED Final   Parainfluenza Virus 1 NOT DETECTED NOT DETECTED Final   Parainfluenza Virus 2 NOT DETECTED NOT DETECTED Final   Parainfluenza Virus 3 NOT DETECTED NOT DETECTED Final   Parainfluenza Virus 4 NOT DETECTED NOT DETECTED Final   Respiratory Syncytial Virus NOT DETECTED NOT DETECTED Final   Bordetella pertussis NOT DETECTED NOT DETECTED Final   Bordetella Parapertussis NOT DETECTED NOT DETECTED Final   Chlamydophila pneumoniae NOT DETECTED NOT DETECTED Final   Mycoplasma pneumoniae NOT DETECTED NOT DETECTED Final    Comment: Performed at [facility], [place]. [address], [place], [place] [address]         Radiology Studies: DG Chest  Port 1 View  Result Date: 07/16/2022 CLINICAL DATA:  Shortness of breath EXAM: PORTABLE CHEST 1 VIEW COMPARISON:  Chest x-ray dated March 02, 2022 FINDINGS: The heart size and mediastinal contours are within normal limits. Both lungs are clear. The visualized skeletal structures are unremarkable. IMPRESSION: No active disease. Electronically Signed   By: Yetta Glassman M.D.   On: 07/16/2022 13:29        Scheduled Meds:  acidophilus  1 capsule Oral q AM   arformoterol  15 mcg Nebulization BID   vitamin C  1,000 mg Oral Daily   aspirin EC  81 mg Oral Daily   atorvastatin  40 mg Oral Daily   azithromycin  500 mg Oral Daily   budesonide (PULMICORT) nebulizer solution  0.25 mg Nebulization BID   Chlorhexidine Gluconate Cloth  6 each Topical Daily   cholecalciferol  2,000 Units Oral q AM   enoxaparin (LOVENOX) injection  40 mg Subcutaneous Q24H   ferrous sulfate  325 mg Oral BID WC   guaiFENesin  1,200 mg Oral BID   insulin aspart  0-9 Units Subcutaneous Q4H   ipratropium-albuterol  3 mL Nebulization Q6H   loratadine  10 mg Oral Daily   melatonin  3 mg Oral QHS   methylPREDNISolone (SOLU-MEDROL)  injection  40 mg Intravenous Q12H   mouth rinse  15 mL Mouth Rinse 4 times per day   pantoprazole  40 mg Oral BID   sertraline  50 mg Oral QHS   Continuous Infusions:  cefTRIAXone (ROCEPHIN)  IV Stopped (07/18/22 0324)     LOS: 2 days    Time spent: 35 minutes.    Elmarie Shiley, MD [facility]   If 7PM-7AM, please contact night-coverage [email or web address]  07/18/2022, 9:09 AM

## 2022-07-18 NOTE — Evaluation (Signed)
Occupational Therapy Evaluation Patient Details Name: Paul [name] MRN: [id number] DOB: March 23, 1946 Today's Date: 07/18/2022   History of Present Illness 76 year old with past medical history significant for COPD on chronic 2 L of oxygen at baseline, diabetes type 2, hypertension presents complaining of worsening shortness of breath. Patient admitted for COPD exacerbation   Clinical Impression   Patient independent with ambulation and ADLs with no reports of increased shortness of breath or fatigue. He was able to ambulate in hall without oxygen. Became short of breath near end of ambulation. Unsure if o2 sat dropped due to unclear pleth signal. From an OT perspective he has no needs.      Recommendations for follow up therapy are one component of a multi-disciplinary discharge planning process, led by the attending physician.  Recommendations may be updated based on patient status, additional functional criteria and insurance authorization.   Follow Up Recommendations  No OT follow up    Assistance Recommended at Discharge None  Patient can return home with the following      Functional Status Assessment  Patient has not had a recent decline in their functional status  Equipment Recommendations       Recommendations for Other Services       Precautions / Restrictions Precautions Precautions: None Restrictions Weight Bearing Restrictions: No      Mobility Bed Mobility               General bed mobility comments: up in chair    Transfers Overall transfer level: Independent                        Balance Overall balance assessment: No apparent balance deficits (not formally assessed)                                         ADL either performed or assessed with clinical judgement   ADL Overall ADL's : Independent                                             Vision Patient Visual Report: No change from baseline        Perception     Praxis      Pertinent Vitals/Pain Pain Assessment Pain Assessment: No/denies pain     Hand Dominance Right   Extremity/Trunk Assessment Upper Extremity Assessment Upper Extremity Assessment: Overall WFL for tasks assessed   Lower Extremity Assessment Lower Extremity Assessment: Defer to PT evaluation   Cervical / Trunk Assessment Cervical / Trunk Assessment: Normal   Communication Communication Communication: No difficulties   Cognition Arousal/Alertness: Awake/alert Behavior During Therapy: WFL for tasks assessed/performed Overall Cognitive Status: Within Functional Limits for tasks assessed                                       General Comments       Exercises     Shoulder Instructions      Home Living Family/patient expects to be discharged to:: Private residence Living Arrangements: Spouse/significant other Available Help at Discharge: Family;Available 24 hours/day Type of Home: House Home Access: Level entry     Home Layout: One level  Bathroom Shower/Tub: Occupational psychologist: Standard Bathroom Accessibility: Yes   Home Equipment: Rollator (4 wheels);Shower seat;Grab bars - tub/shower          Prior Functioning/Environment Prior Level of Function : Independent/Modified Independent             Mobility Comments: Pt reports going to the gym and using the treadmill          OT Problem List: Cardiopulmonary status limiting activity      OT Treatment/Interventions:      OT Goals(Current goals can be found in the care plan section) Acute Rehab OT Goals OT Goal Formulation: All assessment and education complete, DC therapy  OT Frequency:      Co-evaluation PT/OT/SLP Co-Evaluation/Treatment: Yes (co eval)            AM-PAC OT "6 Clicks" Daily Activity     Outcome Measure Help from another person eating meals?: None Help from another person taking care of personal grooming?:  None Help from another person toileting, which includes using toliet, bedpan, or urinal?: None Help from another person bathing (including washing, rinsing, drying)?: None Help from another person to put on and taking off regular upper body clothing?: None Help from another person to put on and taking off regular lower body clothing?: None 6 Click Score: 24   End of Session    Activity Tolerance: Patient tolerated treatment well Patient left: in chair;with call bell/phone within reach  OT Visit Diagnosis: Muscle weakness (generalized) (M62.81)                Time: 4270-6237 OT Time Calculation (min): 9 min Charges:  OT General Charges $OT Visit: 1 Visit OT Evaluation $OT Eval Low Complexity: 1 Low  Alisi Lupien, OTR/L [place]  Office [phone] Pager: [phone]   Lenward Chancellor 07/18/2022, 12:59 PM

## 2022-07-18 NOTE — Evaluation (Signed)
Physical Therapy Evaluation Patient Details Name: Paul [name] MRN: [id number] DOB: 09/17/1946 Today's Date: 07/18/2022  History of Present Illness  76 year old with past medical history significant for COPD on chronic 2 L of oxygen at baseline, diabetes type 2, hypertension presents complaining of worsening shortness of breath. Patient admitted for COPD exacerbation  Clinical Impression  The  patient is independent with ambulation. Patient ambulated on RA x 400'. Patient with noted 4/4 dyspnea, SPO2 reading 87% quickly to 100% . Patient  stating that he needed his 2 L O2. SPO2 remained at 100%. RR in 30's.  Patient reports Oxygen use when goes to gym and at night. Patient is ambulatory, no further  acute PT needs. Can ambulate with staff.     Recommendations for follow up therapy are one component of a multi-disciplinary discharge planning process, led by the attending physician.  Recommendations may be updated based on patient status, additional functional criteria and insurance authorization.  Follow Up Recommendations No PT follow up      Assistance Recommended at Discharge PRN  Patient can return home with the following  Assist for transportation    Equipment Recommendations None recommended by PT  Recommendations for Other Services       Functional Status Assessment Patient has not had a recent decline in their functional status     Precautions / Restrictions Precautions Precautions: None Restrictions Weight Bearing Restrictions: No      Mobility  Bed Mobility               General bed mobility comments: up in chair    Transfers Overall transfer level: Independent                      Ambulation/Gait Ambulation/Gait assistance: Independent Gait Distance (Feet): 400 Feet Assistive device: None Gait Pattern/deviations: WFL(Within Functional Limits)       General Gait Details: patient noted 3-4/4 dyspnea at end  Stairs             Wheelchair Mobility    Modified Rankin (Stroke Patients Only)       Balance Overall balance assessment: No apparent balance deficits (not formally assessed)                                           Pertinent Vitals/Pain Pain Assessment Pain Assessment: No/denies pain    Home Living Family/patient expects to be discharged to:: Private residence Living Arrangements: Spouse/significant other Available Help at Discharge: Family;Available 24 hours/day Type of Home: House Home Access: Level entry       Home Layout: One level Home Equipment: Rollator (4 wheels);Shower seat;Grab bars - tub/shower      Prior Function Prior Level of Function : Independent/Modified Independent             Mobility Comments: Pt reports going to the gym and using the treadmill       Hand Dominance   Dominant Hand: Right    Extremity/Trunk Assessment   Upper Extremity Assessment Upper Extremity Assessment:  (trmors)    Lower Extremity Assessment Lower Extremity Assessment: Overall WFL for tasks assessed    Cervical / Trunk Assessment Cervical / Trunk Assessment: Normal  Communication   Communication: No difficulties  Cognition Arousal/Alertness: Awake/alert Behavior During Therapy: WFL for tasks assessed/performed Overall Cognitive Status: Within Functional Limits for tasks assessed  General Comments      Exercises     Assessment/Plan    PT Assessment Patient does not need any further PT services  PT Problem List         PT Treatment Interventions      PT Goals (Current goals can be found in the Care Plan section)  Acute Rehab PT Goals Patient Stated Goal: go back to the gym PT Goal Formulation: All assessment and education complete, DC therapy    Frequency       Co-evaluation               AM-PAC PT "6 Clicks" Mobility  Outcome Measure Help needed turning from your back  to your side while in a flat bed without using bedrails?: None Help needed moving from lying on your back to sitting on the side of a flat bed without using bedrails?: None Help needed moving to and from a bed to a chair (including a wheelchair)?: None Help needed standing up from a chair using your arms (e.g., wheelchair or bedside chair)?: None Help needed to walk in hospital room?: None Help needed climbing 3-5 steps with a railing? : A Little 6 Click Score: 23    End of Session   Activity Tolerance: Patient tolerated treatment well Patient left: in chair;with call bell/phone within reach Nurse Communication: Mobility status PT Visit Diagnosis: Difficulty in walking, not elsewhere classified (R26.2)    Time: 9675-9163 PT Time Calculation (min) (ACUTE ONLY): 15 min   Charges:   PT Evaluation $PT Eval Low Complexity: 1 Low          [place] Office [phone] Weekend [phone]   Claretha Cooper 07/18/2022, 1:15 PM

## 2022-07-19 DIAGNOSIS — J441 Chronic obstructive pulmonary disease with (acute) exacerbation: Secondary | ICD-10-CM | POA: Diagnosis not present

## 2022-07-19 LAB — GLUCOSE, CAPILLARY
Glucose-Capillary: 105 mg/dL — ABNORMAL HIGH (ref 70–99)
Glucose-Capillary: 112 mg/dL — ABNORMAL HIGH (ref 70–99)
Glucose-Capillary: 114 mg/dL — ABNORMAL HIGH (ref 70–99)
Glucose-Capillary: 125 mg/dL — ABNORMAL HIGH (ref 70–99)
Glucose-Capillary: 127 mg/dL — ABNORMAL HIGH (ref 70–99)
Glucose-Capillary: 127 mg/dL — ABNORMAL HIGH (ref 70–99)
Glucose-Capillary: 141 mg/dL — ABNORMAL HIGH (ref 70–99)

## 2022-07-19 MED ORDER — SULFAMETHOXAZOLE-TRIMETHOPRIM 800-160 MG PO TABS
1.0000 | ORAL_TABLET | Freq: Two times a day (BID) | ORAL | Status: DC
  Administered 2022-07-19 – 2022-07-21 (×5): 1 via ORAL
  Filled 2022-07-19 (×5): qty 1

## 2022-07-19 MED ORDER — SENNOSIDES-DOCUSATE SODIUM 8.6-50 MG PO TABS
1.0000 | ORAL_TABLET | Freq: Two times a day (BID) | ORAL | Status: DC
  Administered 2022-07-19 – 2022-07-21 (×5): 1 via ORAL
  Filled 2022-07-19 (×5): qty 1

## 2022-07-19 MED ORDER — POLYETHYLENE GLYCOL 3350 17 G PO PACK
17.0000 g | PACK | Freq: Every day | ORAL | Status: DC
  Administered 2022-07-19 – 2022-07-21 (×3): 17 g via ORAL
  Filled 2022-07-19 (×3): qty 1

## 2022-07-19 NOTE — Progress Notes (Signed)
PROGRESS NOTE    Paul [name]  JOI:[id number] DOB: Mar 10, 1946 DOA: 07/16/2022 PCP: [name], MD   Brief Narrative: 76 year old with past medical history significant for COPD on chronic 2 L of oxygen at baseline, diabetes type 2, hypertension presents complaining of worsening shortness of breath that is started the day prior to admission, persistent.  Did not improve with nebulizer use at home.  Patient was found to be saturating 70% on 3 L of nasal cannula at rescue on BiPAP.  He received multiple nebulizer treatments and IV Solu-Medrol.  Patient admitted with acute on chronic hypoxic respiratory failure in the setting of COPD exacerbation.  Admitted on BiPAP.   Assessment & Plan:   Principal Problem:   COPD with acute exacerbation ([place]) Active Problems:   COPD GOLD  III    Acute on chronic respiratory failure with hypoxia (HCC)   HLD (hyperlipidemia)   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HCC)   Essential hypertension   Paroxysmal atrial fibrillation (HCC)  1-Acute on chronic hypoxic respiratory failure; Secondary to acute on chronic COPD exacerbation -Patient baseline on 2 L of oxygen, 2 and 3 L with exertion. -He presented with worsening shortness of breath, cough, found to be more hypoxic oxygen saturation 70% on 3 L. -Placed on BiPAP. Currently off BIPAP. He has not required BIPAP since yesterday morning.  -Continue with IV ceftriaxone, azithromycin. -Scheduled DuoNeb every 4 hours. -Continue with  Brovana and Pulmicort Flutter valve.  Slowly improving. Plan to continue with IV steroids today.   2-Paroxysmal A-fib: -Not on beta-blocker or anticoagulation at home, no other anticoagulation due to poor candidacy for long-term anticoagulation due to history of severe bleeding secondary to hematuria, falls and skin tears status post LAAO with watchman 80/10/2021. -Bisoprolol discontinue by his cardiologist due to dizziness.   Hypertension: Bisoprolol discontinue  by his cardiologist due to dizziness.  As needed hydralazine. BP elevated probably related to steroids. PRN hydralazine/   Chronic diastolic heart failure: Appears euvolemic.  His Lasix was discontinued by his cardiology due to dizziness.  He was advised to take Lasix if his weight increases more than 3 pounds 24 hours Daily weight.   Diabetes type 2 with hyperlipidemia On SSI.    Hyperlipidemia: Continue with  Lipitor.  Depression/anxiety: Continue with Zoloft, and alprazolam Bursitis of right elbow. Resume Bactrim. He needs to be on bactrim for 2 weeks.   Estimated body mass index is 24.66 kg/m as calculated from the following:   Height as of this encounter: '5\' 6"'$  (1.676 m).   Weight as of this encounter: 69.3 kg.   DVT prophylaxis: Lovenox Code Status: Full code Family Communication: care discussed with patient.  Disposition Plan:  Status is: Inpatient Remains inpatient appropriate because: Management of acute hypoxic respiratory failure and COPD exacerbation    Consultants:  None  Procedures:  None  Antimicrobials:    Subjective: He is breathing better. But not at baseline.   Objective: Vitals:   07/19/22 0428 07/19/22 0809 07/19/22 1244 07/19/22 1300  BP: 121/65   (!) 185/86  Pulse: 84   91  Resp: 20   16  Temp: (!) 97.5 F (36.4 C)   (!) 97.4 F (36.3 C)  TempSrc: Oral   Oral  SpO2: 99% 99% 96% 100%  Weight:      Height:        Intake/Output Summary (Last 24 hours) at 07/19/2022 1527 Last data filed at 07/19/2022 1118 Gross per 24 hour  Intake 680 ml  Output 1825 ml  Net -1145 ml    Filed Weights   07/16/22 1304  Weight: 69.3 kg    Examination:  General exam: NAD Respiratory system: BBL wheezing Cardiovascular system: S 1, S 2 RRR Gastrointestinal system: BS present. NT Central nervous system: Alert, oriented.  Extremities: No edema   Data Reviewed: I have personally reviewed following labs and imaging studies  CBC: Recent Labs   Lab 07/16/22 1320 07/16/22 1340 07/17/22 0306  WBC 12.2*  --  8.1  NEUTROABS 11.5*  --   --   HGB 13.1 14.3 11.7*  HCT 42.9 42.0 38.5*  MCV 81.1  --  81.7  PLT 269  --  025    Basic Metabolic Panel: Recent Labs  Lab 07/16/22 1320 07/16/22 1340 07/17/22 0306  NA 134* 134* 138  K 4.3 4.5 4.7  CL 99  --  105  CO2 21*  --  24  GLUCOSE 200*  --  149*  BUN 10  --  12  CREATININE 1.01  --  0.84  CALCIUM 9.4  --  8.5*    GFR: Estimated Creatinine Clearance: 66.5 mL/min (by C-G formula based on SCr of 0.84 mg/dL). Liver Function Tests: Recent Labs  Lab 07/16/22 1320  AST 27  ALT 20  ALKPHOS 74  BILITOT 0.3  PROT 7.3  ALBUMIN 4.5    No results for input(s): "LIPASE", "AMYLASE" in the last 168 hours. No results for input(s): "AMMONIA" in the last 168 hours. Coagulation Profile: No results for input(s): "INR", "PROTIME" in the last 168 hours. Cardiac Enzymes: No results for input(s): "CKTOTAL", "CKMB", "CKMBINDEX", "TROPONINI" in the last 168 hours. BNP (last 3 results) No results for input(s): "PROBNP" in the last 8760 hours. HbA1C: No results for input(s): "HGBA1C" in the last 72 hours. CBG: Recent Labs  Lab 07/18/22 2010 07/18/22 2359 07/19/22 0429 07/19/22 0739 07/19/22 1203  GLUCAP 140* 105* 114* 125* 127*    Lipid Profile: No results for input(s): "CHOL", "HDL", "LDLCALC", "TRIG", "CHOLHDL", "LDLDIRECT" in the last 72 hours. Thyroid Function Tests: No results for input(s): "TSH", "T4TOTAL", "FREET4", "T3FREE", "THYROIDAB" in the last 72 hours. Anemia Panel: No results for input(s): "VITAMINB12", "FOLATE", "FERRITIN", "TIBC", "IRON", "RETICCTPCT" in the last 72 hours. Sepsis Labs: No results for input(s): "PROCALCITON", "LATICACIDVEN" in the last 168 hours.  Recent Results (from the past 240 hour(s))  Resp Panel by RT-PCR (Flu A&B, Covid) Anterior Nasal Swab     Status: None   Collection Time: 07/16/22  4:46 PM   Specimen: Anterior Nasal Swab   Result Value Ref Range Status   SARS Coronavirus 2 by RT PCR NEGATIVE NEGATIVE Final    Comment: (NOTE) SARS-CoV-2 target nucleic acids are NOT DETECTED.  The SARS-CoV-2 RNA is generally detectable in upper respiratory specimens during the acute phase of infection. The lowest concentration of SARS-CoV-2 viral copies this assay can detect is 138 copies/mL. A negative result does not preclude SARS-Cov-2 infection and should not be used as the sole basis for treatment or other patient management decisions. A negative result may occur with  improper specimen collection/handling, submission of specimen other than nasopharyngeal swab, presence of viral mutation(s) within the areas targeted by this assay, and inadequate number of viral copies(<138 copies/mL). A negative result must be combined with clinical observations, patient history, and epidemiological information. The expected result is Negative.  Fact Sheet for Patients:  EntrepreneurPulse.com.au  Fact Sheet for Healthcare Providers:  IncredibleEmployment.be  This test is no t yet approved or  cleared by the [place] and  has been authorized for detection and/or diagnosis of SARS-CoV-2 by FDA under an Emergency Use Authorization (EUA). This EUA will remain  in effect (meaning this test can be used) for the duration of the COVID-19 declaration under Section 564(b)(1) of the Act, 21 U.S.C.section 360bbb-3(b)(1), unless the authorization is terminated  or revoked sooner.       Influenza A by PCR NEGATIVE NEGATIVE Final   Influenza B by PCR NEGATIVE NEGATIVE Final    Comment: (NOTE) The Xpert Xpress SARS-CoV-2/FLU/RSV plus assay is intended as an aid in the diagnosis of influenza from Nasopharyngeal swab specimens and should not be used as a sole basis for treatment. Nasal washings and aspirates are unacceptable for Xpert Xpress SARS-CoV-2/FLU/RSV testing.  Fact Sheet for  Patients: EntrepreneurPulse.com.au  Fact Sheet for Healthcare Providers: IncredibleEmployment.be  This test is not yet approved or cleared by the [place] FDA and has been authorized for detection and/or diagnosis of SARS-CoV-2 by FDA under an Emergency Use Authorization (EUA). This EUA will remain in effect (meaning this test can be used) for the duration of the COVID-19 declaration under Section 564(b)(1) of the Act, 21 U.S.C. section 360bbb-3(b)(1), unless the authorization is terminated or revoked.  Performed at [facility], [address] [place], [place] [address]   MRSA Next Gen by PCR, Nasal     Status: None   Collection Time: 07/16/22 10:53 PM   Specimen: Nasal Mucosa; Nasal Swab  Result Value Ref Range Status   MRSA by PCR Next Gen NOT DETECTED NOT DETECTED Final    Comment: (NOTE) The GeneXpert MRSA Assay (FDA approved for NASAL specimens only), is one component of a comprehensive MRSA colonization surveillance program. It is not intended to diagnose MRSA infection nor to guide or monitor treatment for MRSA infections. Test performance is not FDA approved in patients less than 9 years old. Performed at [facility], [place] [address], [place], [place] [address]   Respiratory (~20 pathogens) panel by PCR     Status: None   Collection Time: 07/17/22  9:33 AM   Specimen: Nasopharyngeal Swab; Respiratory  Result Value Ref Range Status   Adenovirus NOT DETECTED NOT DETECTED Final   Coronavirus 229E NOT DETECTED NOT DETECTED Final    Comment: (NOTE) The Coronavirus on the Respiratory Panel, DOES NOT test for the novel  Coronavirus (2019 nCoV)    Coronavirus HKU1 NOT DETECTED NOT DETECTED Final   Coronavirus NL63 NOT DETECTED NOT DETECTED Final   Coronavirus OC43 NOT DETECTED NOT DETECTED Final   Metapneumovirus NOT DETECTED NOT DETECTED Final   Rhinovirus / Enterovirus NOT DETECTED NOT DETECTED  Final   Influenza A NOT DETECTED NOT DETECTED Final   Influenza B NOT DETECTED NOT DETECTED Final   Parainfluenza Virus 1 NOT DETECTED NOT DETECTED Final   Parainfluenza Virus 2 NOT DETECTED NOT DETECTED Final   Parainfluenza Virus 3 NOT DETECTED NOT DETECTED Final   Parainfluenza Virus 4 NOT DETECTED NOT DETECTED Final   Respiratory Syncytial Virus NOT DETECTED NOT DETECTED Final   Bordetella pertussis NOT DETECTED NOT DETECTED Final   Bordetella Parapertussis NOT DETECTED NOT DETECTED Final   Chlamydophila pneumoniae NOT DETECTED NOT DETECTED Final   Mycoplasma pneumoniae NOT DETECTED NOT DETECTED Final    Comment: Performed at [facility], [place]. [address], [place], [place] [address]         Radiology Studies: No results found.  Scheduled Meds:  acidophilus  1 capsule Oral q AM   arformoterol  15 mcg Nebulization BID   vitamin C  1,000 mg Oral Daily   aspirin EC  81 mg Oral Daily   atorvastatin  40 mg Oral Daily   budesonide (PULMICORT) nebulizer solution  0.25 mg Nebulization BID   Chlorhexidine Gluconate Cloth  6 each Topical Daily   cholecalciferol  2,000 Units Oral q AM   enoxaparin (LOVENOX) injection  40 mg Subcutaneous Q24H   ferrous sulfate  325 mg Oral BID WC   guaiFENesin  1,200 mg Oral BID   insulin aspart  0-9 Units Subcutaneous Q4H   ipratropium-albuterol  3 mL Nebulization TID   loratadine  10 mg Oral Daily   melatonin  3 mg Oral QHS   methylPREDNISolone (SOLU-MEDROL) injection  40 mg Intravenous Q12H   mouth rinse  15 mL Mouth Rinse 4 times per day   pantoprazole  40 mg Oral BID   polyethylene glycol  17 g Oral Daily   senna-docusate  1 tablet Oral BID   sertraline  50 mg Oral QHS   sulfamethoxazole-trimethoprim  1 tablet Oral BID   Continuous Infusions:  cefTRIAXone (ROCEPHIN)  IV Stopped (07/19/22 1011)     LOS: 3 days    Time spent: 35 minutes.    Elmarie Shiley, MD [facility]   If 7PM-7AM, please contact  night-coverage [email or web address]  07/19/2022, 3:27 PM

## 2022-07-19 NOTE — Progress Notes (Signed)
No need of bipap at this time. Pt on RA with O2 saturation of 97. No resp distress noted.

## 2022-07-19 NOTE — Care Management Important Message (Signed)
Important Message  Patient Details IM Letter given to the Patient. Name: QUAY SIMKIN MRN: [id number] Date of Birth: September 17, 1946   Medicare Important Message Given:  Yes     Kerin Salen 07/19/2022, 10:29 AM

## 2022-07-19 NOTE — Plan of Care (Signed)

## 2022-07-20 DIAGNOSIS — J441 Chronic obstructive pulmonary disease with (acute) exacerbation: Secondary | ICD-10-CM | POA: Diagnosis not present

## 2022-07-20 LAB — BASIC METABOLIC PANEL
Anion gap: 8 (ref 5–15)
BUN: 24 mg/dL — ABNORMAL HIGH (ref 8–23)
CO2: 26 mmol/L (ref 22–32)
Calcium: 8.3 mg/dL — ABNORMAL LOW (ref 8.9–10.3)
Chloride: 106 mmol/L (ref 98–111)
Creatinine, Ser: 0.86 mg/dL (ref 0.61–1.24)
GFR, Estimated: >60 mL/min (ref >60)
Glucose, Bld: 116 mg/dL — ABNORMAL HIGH (ref 70–99)
Potassium: 4.6 mmol/L (ref 3.5–5.1)
Sodium: 140 mmol/L (ref 135–145)

## 2022-07-20 LAB — GLUCOSE, CAPILLARY
Glucose-Capillary: 90 mg/dL (ref 70–99)
Glucose-Capillary: 114 mg/dL — ABNORMAL HIGH (ref 70–99)
Glucose-Capillary: 113 mg/dL — ABNORMAL HIGH (ref 70–99)
Glucose-Capillary: 144 mg/dL — ABNORMAL HIGH (ref 70–99)
Glucose-Capillary: 114 mg/dL — ABNORMAL HIGH (ref 70–99)

## 2022-07-20 NOTE — Plan of Care (Signed)

## 2022-07-20 NOTE — Progress Notes (Signed)
PROGRESS NOTE    Paul [name]  WUJ:[id number] DOB: 1946-05-01 DOA: 07/16/2022 PCP: [name], MD   Brief Narrative: 76 year old with past medical history significant for COPD on chronic 2 L of oxygen at baseline, diabetes type 2, hypertension presents complaining of worsening shortness of breath that is started the day prior to admission, persistent.  Did not improve with nebulizer use at home.  Patient was found to be saturating 70% on 3 L of nasal cannula at rescue on BiPAP.  He received multiple nebulizer treatments and IV Solu-Medrol.  Patient admitted with acute on chronic hypoxic respiratory failure in the setting of COPD exacerbation.  Admitted on BiPAP.   Assessment & Plan:   Principal Problem:   COPD with acute exacerbation ([place]) Active Problems:   COPD GOLD  III    Acute on chronic respiratory failure with hypoxia (HCC)   HLD (hyperlipidemia)   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HCC)   Essential hypertension   Paroxysmal atrial fibrillation (HCC)  1-Acute on chronic hypoxic respiratory failure; Secondary to acute on chronic COPD exacerbation -Patient baseline on 2 L of oxygen, 2 and 3 L with exertion. -He presented with worsening shortness of breath, cough, found to be more hypoxic oxygen saturation 70% on 3 L. -Placed on BiPAP. Currently off BIPAP. He has not required BIPAP since yesterday morning.  -Continue with IV ceftriaxone.  -Scheduled DuoNeb every 4 hours. -Continue with  Brovana and Pulmicort Flutter valve.  Slowly improving. Plan to continue with IV steroids/  Report cough, SOB, improving slowly.   2-Paroxysmal A-fib: -Not on beta-blocker or anticoagulation at home, no other anticoagulation due to poor candidacy for long-term anticoagulation due to history of severe bleeding secondary to hematuria, falls and skin tears status post LAAO with watchman 80/10/2021. -Bisoprolol discontinue by his cardiologist due to dizziness.    Hypertension: Bisoprolol discontinue by his cardiologist due to dizziness.  As needed hydralazine. BP elevated probably related to steroids. PRN hydralazine/   Chronic diastolic heart failure: Appears euvolemic.  His Lasix was discontinued by his cardiology due to dizziness.  He was advised to take Lasix if his weight increases more than 3 pounds 24 hours Daily weight.   Diabetes type 2 with hyperlipidemia On SSI.    Hyperlipidemia: Continue with  Lipitor.  Depression/anxiety: Continue with Zoloft, and alprazolam Bursitis of right elbow. Resume Bactrim. He needs to be on bactrim for 2 weeks.   Estimated body mass index is 24.78 kg/m as calculated from the following:   Height as of this encounter: '5\' 6"'$  (1.676 m).   Weight as of this encounter: 69.6 kg.   DVT prophylaxis: Lovenox Code Status: Full code Family Communication: care discussed with patient.  Disposition Plan:  Status is: Inpatient Remains inpatient appropriate because: Management of acute hypoxic respiratory failure and COPD exacerbation    Consultants:  None  Procedures:  None  Antimicrobials:    Subjective: Report cough, dyspnea improved but not at baseline. Does not feel ready to go home today.   Objective: Vitals:   07/20/22 0425 07/20/22 0827 07/20/22 1423 07/20/22 1432  BP:   (!) 150/70   Pulse:   94   Resp:   16   Temp:   98.3 F (36.8 C)   TempSrc:   Oral   SpO2:  96% 96% 94%  Weight: 69.6 kg     Height:        Intake/Output Summary (Last 24 hours) at 07/20/2022 1523 Last data filed at 07/20/2022  6160 Gross per 24 hour  Intake 800 ml  Output --  Net 800 ml    Filed Weights   07/16/22 1304 07/20/22 0425  Weight: 69.3 kg 69.6 kg    Examination:  General exam: NAD Respiratory system: BL wheezing.  Cardiovascular system: S 1, S 2 RRR Gastrointestinal system: BS present, soft, nt Central nervous system: Alert, and oriented.  Extremities: No edema   Data Reviewed: I have  personally reviewed following labs and imaging studies  CBC: Recent Labs  Lab 07/16/22 1320 07/16/22 1340 07/17/22 0306  WBC 12.2*  --  8.1  NEUTROABS 11.5*  --   --   HGB 13.1 14.3 11.7*  HCT 42.9 42.0 38.5*  MCV 81.1  --  81.7  PLT 269  --  737    Basic Metabolic Panel: Recent Labs  Lab 07/16/22 1320 07/16/22 1340 07/17/22 0306 07/20/22 0542  NA 134* 134* 138 140  K 4.3 4.5 4.7 4.6  CL 99  --  105 106  CO2 21*  --  24 26  GLUCOSE 200*  --  149* 116*  BUN 10  --  12 24*  CREATININE 1.01  --  0.84 0.86  CALCIUM 9.4  --  8.5* 8.3*    GFR: Estimated Creatinine Clearance: 64.9 mL/min (by C-G formula based on SCr of 0.86 mg/dL). Liver Function Tests: Recent Labs  Lab 07/16/22 1320  AST 27  ALT 20  ALKPHOS 74  BILITOT 0.3  PROT 7.3  ALBUMIN 4.5    No results for input(s): "LIPASE", "AMYLASE" in the last 168 hours. No results for input(s): "AMMONIA" in the last 168 hours. Coagulation Profile: No results for input(s): "INR", "PROTIME" in the last 168 hours. Cardiac Enzymes: No results for input(s): "CKTOTAL", "CKMB", "CKMBINDEX", "TROPONINI" in the last 168 hours. BNP (last 3 results) No results for input(s): "PROBNP" in the last 8760 hours. HbA1C: No results for input(s): "HGBA1C" in the last 72 hours. CBG: Recent Labs  Lab 07/19/22 2023 07/19/22 2333 07/20/22 0412 07/20/22 0747 07/20/22 1201  GLUCAP 141* 112* 90 114* 113*    Lipid Profile: No results for input(s): "CHOL", "HDL", "LDLCALC", "TRIG", "CHOLHDL", "LDLDIRECT" in the last 72 hours. Thyroid Function Tests: No results for input(s): "TSH", "T4TOTAL", "FREET4", "T3FREE", "THYROIDAB" in the last 72 hours. Anemia Panel: No results for input(s): "VITAMINB12", "FOLATE", "FERRITIN", "TIBC", "IRON", "RETICCTPCT" in the last 72 hours. Sepsis Labs: No results for input(s): "PROCALCITON", "LATICACIDVEN" in the last 168 hours.  Recent Results (from the past 240 hour(s))  Resp Panel by RT-PCR (Flu  A&B, Covid) Anterior Nasal Swab     Status: None   Collection Time: 07/16/22  4:46 PM   Specimen: Anterior Nasal Swab  Result Value Ref Range Status   SARS Coronavirus 2 by RT PCR NEGATIVE NEGATIVE Final    Comment: (NOTE) SARS-CoV-2 target nucleic acids are NOT DETECTED.  The SARS-CoV-2 RNA is generally detectable in upper respiratory specimens during the acute phase of infection. The lowest concentration of SARS-CoV-2 viral copies this assay can detect is 138 copies/mL. A negative result does not preclude SARS-Cov-2 infection and should not be used as the sole basis for treatment or other patient management decisions. A negative result may occur with  improper specimen collection/handling, submission of specimen other than nasopharyngeal swab, presence of viral mutation(s) within the areas targeted by this assay, and inadequate number of viral copies(<138 copies/mL). A negative result must be combined with clinical observations, patient history, and epidemiological information. The expected result  is Negative.  Fact Sheet for Patients:  EntrepreneurPulse.com.au  Fact Sheet for Healthcare Providers:  IncredibleEmployment.be  This test is no t yet approved or cleared by the [place] FDA and  has been authorized for detection and/or diagnosis of SARS-CoV-2 by FDA under an Emergency Use Authorization (EUA). This EUA will remain  in effect (meaning this test can be used) for the duration of the COVID-19 declaration under Section 564(b)(1) of the Act, 21 U.S.C.section 360bbb-3(b)(1), unless the authorization is terminated  or revoked sooner.       Influenza A by PCR NEGATIVE NEGATIVE Final   Influenza B by PCR NEGATIVE NEGATIVE Final    Comment: (NOTE) The Xpert Xpress SARS-CoV-2/FLU/RSV plus assay is intended as an aid in the diagnosis of influenza from Nasopharyngeal swab specimens and should not be used as a sole basis for treatment.  Nasal washings and aspirates are unacceptable for Xpert Xpress SARS-CoV-2/FLU/RSV testing.  Fact Sheet for Patients: EntrepreneurPulse.com.au  Fact Sheet for Healthcare Providers: IncredibleEmployment.be  This test is not yet approved or cleared by the [place] FDA and has been authorized for detection and/or diagnosis of SARS-CoV-2 by FDA under an Emergency Use Authorization (EUA). This EUA will remain in effect (meaning this test can be used) for the duration of the COVID-19 declaration under Section 564(b)(1) of the Act, 21 U.S.C. section 360bbb-3(b)(1), unless the authorization is terminated or revoked.  Performed at [facility], [address] [place], [place] [address]   MRSA Next Gen by PCR, Nasal     Status: None   Collection Time: 07/16/22 10:53 PM   Specimen: Nasal Mucosa; Nasal Swab  Result Value Ref Range Status   MRSA by PCR Next Gen NOT DETECTED NOT DETECTED Final    Comment: (NOTE) The GeneXpert MRSA Assay (FDA approved for NASAL specimens only), is one component of a comprehensive MRSA colonization surveillance program. It is not intended to diagnose MRSA infection nor to guide or monitor treatment for MRSA infections. Test performance is not FDA approved in patients less than 92 years old. Performed at [facility], [place] [address], [place], [place] [address]   Respiratory (~20 pathogens) panel by PCR     Status: None   Collection Time: 07/17/22  9:33 AM   Specimen: Nasopharyngeal Swab; Respiratory  Result Value Ref Range Status   Adenovirus NOT DETECTED NOT DETECTED Final   Coronavirus 229E NOT DETECTED NOT DETECTED Final    Comment: (NOTE) The Coronavirus on the Respiratory Panel, DOES NOT test for the novel  Coronavirus (2019 nCoV)    Coronavirus HKU1 NOT DETECTED NOT DETECTED Final   Coronavirus NL63 NOT DETECTED NOT DETECTED Final   Coronavirus OC43 NOT DETECTED NOT DETECTED  Final   Metapneumovirus NOT DETECTED NOT DETECTED Final   Rhinovirus / Enterovirus NOT DETECTED NOT DETECTED Final   Influenza A NOT DETECTED NOT DETECTED Final   Influenza B NOT DETECTED NOT DETECTED Final   Parainfluenza Virus 1 NOT DETECTED NOT DETECTED Final   Parainfluenza Virus 2 NOT DETECTED NOT DETECTED Final   Parainfluenza Virus 3 NOT DETECTED NOT DETECTED Final   Parainfluenza Virus 4 NOT DETECTED NOT DETECTED Final   Respiratory Syncytial Virus NOT DETECTED NOT DETECTED Final   Bordetella pertussis NOT DETECTED NOT DETECTED Final   Bordetella Parapertussis NOT DETECTED NOT DETECTED Final   Chlamydophila pneumoniae NOT DETECTED NOT DETECTED Final   Mycoplasma pneumoniae NOT DETECTED NOT DETECTED Final    Comment: Performed at [facility], [place] [address], [place], [place] [address]         Radiology Studies: No results found.      Scheduled Meds:  acidophilus  1 capsule Oral q AM   arformoterol  15 mcg Nebulization BID   vitamin C  1,000 mg Oral Daily   aspirin EC  81 mg Oral Daily   atorvastatin  40 mg Oral Daily   budesonide (PULMICORT) nebulizer solution  0.25 mg Nebulization BID   cholecalciferol  2,000 Units Oral q AM   enoxaparin (LOVENOX) injection  40 mg Subcutaneous Q24H   ferrous sulfate  325 mg Oral BID WC   guaiFENesin  1,200 mg Oral BID   insulin aspart  0-9 Units Subcutaneous Q4H   ipratropium-albuterol  3 mL Nebulization TID   loratadine  10 mg Oral Daily   melatonin  3 mg Oral QHS   methylPREDNISolone (SOLU-MEDROL) injection  40 mg Intravenous Q12H   mouth rinse  15 mL Mouth Rinse 4 times per day   pantoprazole  40 mg Oral BID   polyethylene glycol  17 g Oral Daily   senna-docusate  1 tablet Oral BID   sertraline  50 mg Oral QHS   sulfamethoxazole-trimethoprim  1 tablet Oral BID   Continuous Infusions:  cefTRIAXone (ROCEPHIN)  IV 1 g (07/20/22 0129)     LOS: 4 days    Time spent: 35 minutes.    Elmarie Shiley, MD [facility]   If 7PM-7AM, please contact night-coverage [email or web address]  07/20/2022, 3:23 PM

## 2022-07-20 NOTE — Progress Notes (Signed)
Bipap not needed at this time. Pt is doing well at RA at this moment. No resp distress noted.

## 2022-07-21 DIAGNOSIS — J441 Chronic obstructive pulmonary disease with (acute) exacerbation: Secondary | ICD-10-CM | POA: Diagnosis not present

## 2022-07-21 LAB — GLUCOSE, CAPILLARY
Glucose-Capillary: 114 mg/dL — ABNORMAL HIGH (ref 70–99)
Glucose-Capillary: 117 mg/dL — ABNORMAL HIGH (ref 70–99)
Glucose-Capillary: 136 mg/dL — ABNORMAL HIGH (ref 70–99)
Glucose-Capillary: 84 mg/dL (ref 70–99)

## 2022-07-21 MED ORDER — PREDNISONE 20 MG PO TABS: ORAL_TABLET | ORAL | 0 refills | Status: DC

## 2022-07-21 MED ORDER — PANTOPRAZOLE SODIUM 40 MG PO TBEC: 40.0000 mg | DELAYED_RELEASE_TABLET | Freq: Two times a day (BID) | ORAL | 1 refills | Status: DC

## 2022-07-21 MED ORDER — GUAIFENESIN ER 600 MG PO TB12: 1200.0000 mg | ORAL_TABLET | Freq: Two times a day (BID) | ORAL | 0 refills | Status: DC

## 2022-07-21 NOTE — Discharge Summary (Signed)
Physician Discharge Summary   Patient: Paul [name] MRN: [id number] DOB: [birth year]  Admit date:     07/16/2022  Discharge date: 07/21/22  Discharge Physician: Elmarie Shiley   PCP: [name], MD   Recommendations at discharge:   Follow up on resolution of COPD exacerbation.   Discharge Diagnoses: Principal Problem:   COPD with acute exacerbation ([facility]) Active Problems:   COPD GOLD  III    Acute on chronic respiratory failure with hypoxia (HCC)   HLD (hyperlipidemia)   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HCC)   Essential hypertension   Paroxysmal atrial fibrillation (HCC)  Resolved Problems:   * No resolved hospital problems. *  Hospital Course: 76 year old with past medical history significant for COPD on chronic 2 L of oxygen at baseline, diabetes type 2, hypertension presents complaining of worsening shortness of breath that is started the day prior to admission, persistent.  Did not improve with nebulizer use at home.  Patient was found to be saturating 70% on 3 L of nasal cannula at rescue on BiPAP.  He received multiple nebulizer treatments and IV Solu-Medrol.   Patient admitted with acute on chronic hypoxic respiratory failure in the setting of COPD exacerbation.  Admitted on BiPAP. He was  subsequently able to be weaned off from BiPAP to nasal cannula oxygen.  He was treated with IV steroids and nebulizer treatments.  He was a slow to improve.   Assessment and Plan:  1-Acute on chronic hypoxic respiratory failure; Secondary to acute on chronic COPD exacerbation -Patient baseline on 2 L of oxygen, 2 and 3 L with exertion. -He presented with worsening shortness of breath, cough, found to be more hypoxic oxygen saturation 70% on 3 L. -Placed on BiPAP. Currently off BIPAP. He has not required further BIPAP. -received  IV ceftriaxone.  -Scheduled DuoNeb every 4 hours. -Treated  with  Brovana and Pulmicort Flutter valve.  He was treated with IV steroids.   He will be discharged home on prednisone taper, he will subsequently resume his prednisone dose of 5 mg. He was slow to improve but today he feels better plan to discharge home   2-Paroxysmal A-fib: -Not on beta-blocker or anticoagulation at home, no other anticoagulation due to poor candidacy for long-term anticoagulation due to history of severe bleeding secondary to hematuria, falls and skin tears status post LAAO with watchman 80/10/2021. -Bisoprolol discontinue by his cardiologist due to dizziness.    Hypertension: Bisoprolol discontinue by his cardiologist due to dizziness.  As needed hydralazine. BP elevated probably related to steroids. PRN hydralazine/    Chronic diastolic heart failure: Appears euvolemic.  His Lasix was discontinued by his cardiology due to dizziness.  He was advised to take Lasix if his weight increases more than 3 pounds 24 hours Daily weight.    Diabetes type 2 with hyperlipidemia On SSI.     Hyperlipidemia: Continue with  Lipitor.   Depression/anxiety: Continue with Zoloft, and alprazolam Bursitis of right elbow. Resume Bactrim. He needs to be on bactrim for 2 weeks.    Estimated body mass index is 24.78 kg/m as calculated from the following:   Height as of this encounter: '5\' 6"'$  (1.676 m).   Weight as of this encounter: 69.6 kg.          Consultants: None Procedures performed: none Disposition: Home Diet recommendation:  Discharge Diet Orders (From admission, onward)     Start     Ordered   07/21/22 0000  Diet - low  sodium heart healthy        07/21/22 1045           Cardiac diet DISCHARGE MEDICATION: Allergies as of 07/21/2022       Reactions   Tape Other (See Comments)   PLEASE USE COBAN WRAP IN LIEU OF "TAPE," as it "pulls off the skin!!   Daliresp [roflumilast] Diarrhea, Nausea And Vomiting   Levaquin [levofloxacin] Palpitations, Other (See Comments)   Made the B/P fluctuate and heart raced   Alendronate Anxiety, Other (See  Comments)   Jittery/nervous        Medication List     TAKE these medications    acetaminophen 500 MG tablet Commonly known as: TYLENOL Take 1,000 mg by mouth at bedtime as needed (pain).   ALPRAZolam 0.25 MG tablet Commonly known as: XANAX Take 0.25 mg by mouth as needed for anxiety.   aspirin EC 81 MG tablet Take 1 tablet (81 mg total) by mouth daily. Swallow whole.   atorvastatin 40 MG tablet Commonly known as: LIPITOR TAKE 1 TABLET BY MOUTH EVERYDAY AT BEDTIME What changed: See the new instructions.   Breztri Aerosphere 160-9-4.8 MCG/ACT Aero Generic drug: Budeson-Glycopyrrol-Formoterol Inhale 2 puffs into the lungs 2 (two) times daily.   calcium carbonate 600 MG Tabs tablet Commonly known as: OS-CAL Take 600 mg by mouth daily.   denosumab 60 MG/ML Sosy injection Commonly known as: PROLIA Inject 60 mg into the skin every 6 (six) months.   ferrous sulfate 325 (65 FE) MG tablet Take 325 mg by mouth 2 (two) times daily with a meal.   fluticasone 50 MCG/ACT nasal spray Commonly known as: FLONASE Place 2 sprays into both nostrils daily as needed for allergies or rhinitis.   guaiFENesin 600 MG 12 hr tablet Commonly known as: MUCINEX Take 2 tablets (1,200 mg total) by mouth 2 (two) times daily. What changed:  how much to take when to take this   loratadine 10 MG tablet Commonly known as: CLARITIN Take 10 mg by mouth in the morning.   LORazepam 0.5 MG tablet Commonly known as: ATIVAN Take 0.5 mg by mouth as needed for anxiety.   multivitamin with minerals Tabs tablet Take 1 tablet by mouth daily with breakfast.   OXYGEN Inhale 2 L/min into the lungs at bedtime.   pantoprazole 40 MG tablet Commonly known as: PROTONIX Take 1 tablet (40 mg total) by mouth 2 (two) times daily.   predniSONE 20 MG tablet Commonly known as: DELTASONE Take 40 mg  for 2 days then 30 mg  for 3 days then 20 mg for 2 days the 10 mg for 2 days then resume home dose of 5 mg  daily. What changed:  how much to take how to take this when to take this additional instructions Another medication with the same name was removed. Continue taking this medication, and follow the directions you see here.   ProAir HFA 108 (90 Base) MCG/ACT inhaler Generic drug: albuterol Inhale 2 puffs into the lungs as needed for wheezing or shortness of breath. What changed: Another medication with the same name was changed. Make sure you understand how and when to take each.   albuterol (2.5 MG/3ML) 0.083% nebulizer solution Commonly known as: PROVENTIL Take 3 mLs (2.5 mg total) by nebulization every 6 (six) hours as needed for shortness of breath or wheezing. What changed: when to take this   Probiotic Daily Caps Take 1 capsule by mouth in the morning.   sertraline 50 MG  tablet Commonly known as: ZOLOFT TAKE 1 TABLET BY MOUTH EVERY DAY What changed: when to take this   sulfamethoxazole-trimethoprim 800-160 MG tablet Commonly known as: BACTRIM DS Take 1 tablet by mouth 2 (two) times daily.   vitamin C 1000 MG tablet Take 1,000 mg by mouth daily.   Vitamin D3 50 MCG (2000 UT) Tabs Take 2,000 Units by mouth in the morning.        Follow-up Information     Cox, Kirsten, MD Follow up in 1 week(s).   Specialties: Internal Medicine, Interventional Cardiology, Radiology, Anesthesiology Contact information: [address] [place] [address] [phone]         Jerline Pain, MD .   Specialty: Cardiology Contact information: [phone] [place] [address] [phone]         Vickie Epley, MD .   Specialties: Cardiology, Radiology Contact information: [place] [address] [phone]                Discharge Exam: Danley Danker Weights   07/16/22 1304 07/20/22 0425 07/21/22 0500  Weight: 69.3 kg 69.6 kg 69.9 kg   General; NAD   Condition at discharge: stable  The results of  significant diagnostics from this hospitalization (including imaging, microbiology, ancillary and laboratory) are listed below for reference.   Imaging Studies: DG Chest Port 1 View  Result Date: 07/16/2022 CLINICAL DATA:  Shortness of breath EXAM: PORTABLE CHEST 1 VIEW COMPARISON:  Chest x-ray dated March 02, 2022 FINDINGS: The heart size and mediastinal contours are within normal limits. Both lungs are clear. The visualized skeletal structures are unremarkable. IMPRESSION: No active disease. Electronically Signed   By: Yetta Glassman M.D.   On: 07/16/2022 13:29    Microbiology: Results for orders placed or performed during the hospital encounter of 07/16/22  Resp Panel by RT-PCR (Flu A&B, Covid) Anterior Nasal Swab     Status: None   Collection Time: 07/16/22  4:46 PM   Specimen: Anterior Nasal Swab  Result Value Ref Range Status   SARS Coronavirus 2 by RT PCR NEGATIVE NEGATIVE Final    Comment: (NOTE) SARS-CoV-2 target nucleic acids are NOT DETECTED.  The SARS-CoV-2 RNA is generally detectable in upper respiratory specimens during the acute phase of infection. The lowest concentration of SARS-CoV-2 viral copies this assay can detect is 138 copies/mL. A negative result does not preclude SARS-Cov-2 infection and should not be used as the sole basis for treatment or other patient management decisions. A negative result may occur with  improper specimen collection/handling, submission of specimen other than nasopharyngeal swab, presence of viral mutation(s) within the areas targeted by this assay, and inadequate number of viral copies(<138 copies/mL). A negative result must be combined with clinical observations, patient history, and epidemiological information. The expected result is Negative.  Fact Sheet for Patients:  EntrepreneurPulse.com.au  Fact Sheet for Healthcare Providers:  IncredibleEmployment.be  This test is no t yet approved or  cleared by the [place] FDA and  has been authorized for detection and/or diagnosis of SARS-CoV-2 by FDA under an Emergency Use Authorization (EUA). This EUA will remain  in effect (meaning this test can be used) for the duration of the COVID-19 declaration under Section 564(b)(1) of the Act, 21 U.S.C.section 360bbb-3(b)(1), unless the authorization is terminated  or revoked sooner.       Influenza A by PCR NEGATIVE NEGATIVE Final   Influenza B by PCR NEGATIVE NEGATIVE  Final    Comment: (NOTE) The Xpert Xpress SARS-CoV-2/FLU/RSV plus assay is intended as an aid in the diagnosis of influenza from Nasopharyngeal swab specimens and should not be used as a sole basis for treatment. Nasal washings and aspirates are unacceptable for Xpert Xpress SARS-CoV-2/FLU/RSV testing.  Fact Sheet for Patients: EntrepreneurPulse.com.au  Fact Sheet for Healthcare Providers: IncredibleEmployment.be  This test is not yet approved or cleared by the [place] FDA and has been authorized for detection and/or diagnosis of SARS-CoV-2 by FDA under an Emergency Use Authorization (EUA). This EUA will remain in effect (meaning this test can be used) for the duration of the COVID-19 declaration under Section 564(b)(1) of the Act, 21 U.S.C. section 360bbb-3(b)(1), unless the authorization is terminated or revoked.  Performed at [facility], [address] [place], [place] [address]   MRSA Next Gen by PCR, Nasal     Status: None   Collection Time: 07/16/22 10:53 PM   Specimen: Nasal Mucosa; Nasal Swab  Result Value Ref Range Status   MRSA by PCR Next Gen NOT DETECTED NOT DETECTED Final    Comment: (NOTE) The GeneXpert MRSA Assay (FDA approved for NASAL specimens only), is one component of a comprehensive MRSA colonization surveillance program. It is not intended to diagnose MRSA infection nor to guide or monitor treatment for MRSA infections. Test  performance is not FDA approved in patients less than 21 years old. Performed at [facility], [place] [address] [place], [place] [address]   Respiratory (~20 pathogens) panel by PCR     Status: None   Collection Time: 07/17/22  9:33 AM   Specimen: Nasopharyngeal Swab; Respiratory  Result Value Ref Range Status   Adenovirus NOT DETECTED NOT DETECTED Final   Coronavirus 229E NOT DETECTED NOT DETECTED Final    Comment: (NOTE) The Coronavirus on the Respiratory Panel, DOES NOT test for the novel  Coronavirus (2019 nCoV)    Coronavirus HKU1 NOT DETECTED NOT DETECTED Final   Coronavirus NL63 NOT DETECTED NOT DETECTED Final   Coronavirus OC43 NOT DETECTED NOT DETECTED Final   Metapneumovirus NOT DETECTED NOT DETECTED Final   Rhinovirus / Enterovirus NOT DETECTED NOT DETECTED Final   Influenza A NOT DETECTED NOT DETECTED Final   Influenza B NOT DETECTED NOT DETECTED Final   Parainfluenza Virus 1 NOT DETECTED NOT DETECTED Final   Parainfluenza Virus 2 NOT DETECTED NOT DETECTED Final   Parainfluenza Virus 3 NOT DETECTED NOT DETECTED Final   Parainfluenza Virus 4 NOT DETECTED NOT DETECTED Final   Respiratory Syncytial Virus NOT DETECTED NOT DETECTED Final   Bordetella pertussis NOT DETECTED NOT DETECTED Final   Bordetella Parapertussis NOT DETECTED NOT DETECTED Final   Chlamydophila pneumoniae NOT DETECTED NOT DETECTED Final   Mycoplasma pneumoniae NOT DETECTED NOT DETECTED Final    Comment: Performed at [facility], [place]. [address], [place], [place] [address]    Labs: CBC: Recent Labs  Lab 07/16/22 1320 07/16/22 1340 07/17/22 0306  WBC 12.2*  --  8.1  NEUTROABS 11.5*  --   --   HGB 13.1 14.3 11.7*  HCT 42.9 42.0 38.5*  MCV 81.1  --  81.7  PLT 269  --  347   Basic Metabolic Panel: Recent Labs  Lab 07/16/22 1320 07/16/22 1340 07/17/22 0306 07/20/22 0542  NA 134* 134* 138 140  K 4.3 4.5 4.7 4.6  CL 99  --  105 106  CO2 21*  --  24 26  GLUCOSE  200*   --  149* 116*  BUN 10  --  12 24*  CREATININE 1.01  --  0.84 0.86  CALCIUM 9.4  --  8.5* 8.3*   Liver Function Tests: Recent Labs  Lab 07/16/22 1320  AST 27  ALT 20  ALKPHOS 74  BILITOT 0.3  PROT 7.3  ALBUMIN 4.5   CBG: Recent Labs  Lab 07/20/22 2058 07/21/22 0014 07/21/22 0418 07/21/22 0737 07/21/22 1152  GLUCAP 114* 136* 84 114* 117*    Discharge time spent: greater than 30 minutes.  Signed: Elmarie Shiley, MD [facility] 07/21/2022

## 2022-07-21 NOTE — Progress Notes (Signed)
Reviewed written d/c instructions w pt and all questions answered. He verbalized understanding. D/C via w/c w all belongings and home portable 02 tank in stable condition.

## 2022-07-21 NOTE — TOC Transition Note (Signed)
Transition of Care [facility]) - CM/SW Discharge Note   Patient Details  Name: Paul [name] MRN: [id number] Date of Birth: January 09, 1946  Transition of Care [facility]) CM/SW Contact:  Dessa Phi, RN Phone Number: 07/21/2022, 10:06 AM   Clinical Narrative: Spoke to spouse [name] about d/c today-has travel tank in rm already.Has own transport home. No further CM needs.      Final next level of care: Home/Self Care Barriers to Discharge: No Barriers Identified   Patient Goals and CMS Choice Patient states their goals for this hospitalization and ongoing recovery are:: Return home   Choice offered to / list presented to : NA  Discharge Placement                       Discharge Plan and Services In-house Referral: Clinical Social Work   Post Acute Care Choice: NA          DME Arranged: N/A DME Agency: NA                  Social Determinants of Health (SDOH) Interventions     Readmission Risk Interventions    07/17/2022    3:42 PM 03/03/2022    1:33 PM 02/07/2022   12:58 PM  Readmission Risk Prevention Plan  Transportation Screening Complete Complete Complete  PCP or Specialist Appt within 3-5 Days  Complete Complete  HRI or Home Care Consult Complete Complete Complete  Social Work Consult for [place] Planning/Counseling Complete Complete Complete  Palliative Care Screening Not Applicable Complete Complete  Medication Review Press photographer)  Complete Complete

## 2022-07-22 ENCOUNTER — Other Ambulatory Visit: Payer: Self-pay | Admitting: *Deleted

## 2022-07-22 ENCOUNTER — Telehealth: Payer: Self-pay

## 2022-07-22 DIAGNOSIS — I480 Paroxysmal atrial fibrillation: Secondary | ICD-10-CM | POA: Diagnosis not present

## 2022-07-22 DIAGNOSIS — E119 Type 2 diabetes mellitus without complications: Secondary | ICD-10-CM | POA: Diagnosis not present

## 2022-07-22 DIAGNOSIS — I10 Essential (primary) hypertension: Secondary | ICD-10-CM | POA: Diagnosis not present

## 2022-07-22 DIAGNOSIS — J449 Chronic obstructive pulmonary disease, unspecified: Secondary | ICD-10-CM | POA: Diagnosis not present

## 2022-07-22 NOTE — Telephone Encounter (Signed)
Scheduled 07/23/2022. Dr Tobie Poet

## 2022-07-22 NOTE — Telephone Encounter (Signed)
Pts wife called this morning stating that Paul [name] was seen at the hospital and needs to follow up within a week. We have no appointments available to accommodate a hospital follow up this week with any providers. Dr. Tobie Poet, can you please help me find an appointment time to accommodate this patient?

## 2022-07-22 NOTE — Telephone Encounter (Signed)
  Transition Care Management Follow-up Telephone Call    Paul [name] January 20, 1946  Admit Date: 07/16/2022 Discharge Date: 07/21/2022 Discharged from where: [facility]   Diagnoses:  COPD with a acute exacerbation.  Acute respiratory failure with hypoxia. Hyperlipidemia DM type 2  Hypertension  Paroxysmal atrial fibrillation    2 day post discharge: 07/22/2022  EMMERICH CRYER was discharged from [facility] on 07/21/2022 with the diagnoses listed above.   He was contacted today via telephone in regards to transition of care.     Discharge Instructions:   Items Reviewed: Did the pt receive and understand the discharge instructions provided? Yes  Medications obtained and verified? Yes  Other? Yes  Any new allergies since your discharge? No  Dietary orders reviewed? Yes Do you have support at home? Yes   Home Care and Equipment/Supplies: Were home health services ordered? no  Were any new equipment or medical supplies ordered?  No  Functional Questionnaire: (I = Independent and D = Dependent) ADLs: D  Bathing/Dressing- I  Meal Prep- D  Eating- I  Maintaining continence- I  Transferring/Ambulation- I  Managing Meds- D  Any patient concerns? no  Follow up appointments reviewed: PCP Hospital f/u appt confirmed? Yes  Scheduled to see Elnita Maxwell Cox,MD  on July 23 2022  @ 10:40 . [facility] f/u appt confirmed? Yes  Scheduled to see Dr, Marlou Porch  on August 3.  Are transportation arrangements needed? No  If their condition worsens, is the pt aware to call PCP or go to the Emergency Dept.? Yes Was the patient provided with contact information for the PCP's office/after hours number? Yes Was to pt encouraged to call back with questions or concerns? Yes    07/22/22 9:54 AM

## 2022-07-22 NOTE — Patient Outreach (Signed)
  Care Coordination TOC Note Transition Care Management Follow-up Telephone Call Date of discharge and from where: [facility]  07/21/2022 How have you been since you were released from the hospital? Really great Any questions or concerns? No  Items Reviewed: Did the pt receive and understand the discharge instructions provided? Yes  Medications obtained and verified? Yes  Other? No  Any new allergies since your discharge? No  Dietary orders reviewed? Y Low sodium Heart Healthy Do you have support at home? Yes   Home Care and Equipment/Supplies: Were home health services ordered? no If so, what is the name of the agency? N  Has the agency set up a time to come to the patient's home? not applicable Were any new equipment or medical supplies ordered?  No What is the name of the medical supply agency? n Were you able to get the supplies/equipment? not applicable Do you have any questions related to the use of the equipment or supplies? No  Functional Questionnaire: (I = Independent and D = Dependent) ADLs: I  Bathing/Dressing- I  Meal Prep- I  Eating- I  Maintaining continence- I  Transferring/Ambulation- I  Managing Meds- I  Follow up appointments reviewed:  PCP Hospital f/u appt confirmed? Y Scheduled to see Dr Tobie Poet [phone]. At 8 am  Dr Tobie Poet is trying to move the date up. [facility] f/u appt confirmed? Yes  Scheduled to see  Dr Marlou Porch [phone] 11:40 Are transportation arrangements needed? No  If their condition worsens, is the pt aware to call PCP or go to the Emergency Dept.? Yes Was the patient provided with contact information for the PCP's office or ED? Yes Was to pt encouraged to call back with questions or concerns? Yes  SDOH assessments and interventions completed:   Yes  Care Coordination Interventions Activated:  No Care Coordination Interventions:   N/a  Encounter Outcome:  Pt. Visit Completed

## 2022-07-23 ENCOUNTER — Ambulatory Visit (INDEPENDENT_AMBULATORY_CARE_PROVIDER_SITE_OTHER): Payer: Medicare Other | Admitting: Family Medicine

## 2022-07-23 VITALS — BP 140/60 | HR 73 | Temp 96.4°F | Ht 66.0 in | Wt 151.2 lb

## 2022-07-23 DIAGNOSIS — J441 Chronic obstructive pulmonary disease with (acute) exacerbation: Secondary | ICD-10-CM | POA: Diagnosis not present

## 2022-07-23 DIAGNOSIS — J9611 Chronic respiratory failure with hypoxia: Secondary | ICD-10-CM | POA: Diagnosis not present

## 2022-07-23 HISTORY — PX: ELBOW SURGERY: SHX618

## 2022-07-23 MED ORDER — LORAZEPAM 0.5 MG PO TABS
0.5000 mg | ORAL_TABLET | Freq: Every day | ORAL | 1 refills | Status: DC | PRN
Start: 2022-07-23 — End: 2022-07-25

## 2022-07-23 NOTE — Progress Notes (Signed)
Subjective:  Patient ID: Paul [name], male    DOB: 06/18/1946  Age: 76 y.o. MRN: [id number]  Chief Complaint  Patient presents with   COPD   HPI [name] comes in for hospital follow-up.  He was admitted to [facility] on July 16, 2022 with COPD exacerbation, acute respiratory failure with hypoxia.  He was found to be saturating @ 70% on 3L/minute on admission.  He was treated with IV steroids and nebulizer treatments with slow improvement.  He was discharged home on prednisone taper to resume prednisone 5 mg daily after taper.  Patient feels he is doing very well.   Current Outpatient Medications on File Prior to Visit  Medication Sig Dispense Refill   acetaminophen (TYLENOL) 500 MG tablet Take 1,000 mg by mouth at bedtime as needed (pain).     albuterol (PROAIR HFA) 108 (90 Base) MCG/ACT inhaler Inhale 2 puffs into the lungs as needed for wheezing or shortness of breath.     albuterol (PROVENTIL) (2.5 MG/3ML) 0.083% nebulizer solution Take 3 mLs (2.5 mg total) by nebulization every 6 (six) hours as needed for shortness of breath or wheezing. 360 mL 5   Ascorbic Acid (VITAMIN C) 1000 MG tablet Take 1,000 mg by mouth daily.     aspirin EC 81 MG tablet Take 1 tablet (81 mg total) by mouth daily. Swallow whole. 90 tablet 3   atorvastatin (LIPITOR) 40 MG tablet TAKE 1 TABLET BY MOUTH EVERYDAY AT BEDTIME 90 tablet 0   Budeson-Glycopyrrol-Formoterol (BREZTRI AEROSPHERE) 160-9-4.8 MCG/ACT AERO Inhale 2 puffs into the lungs 2 (two) times daily. 32.1 g 3   calcium carbonate (OS-CAL) 600 MG TABS tablet Take 600 mg by mouth daily.     Cholecalciferol (VITAMIN D3) 50 MCG (2000 UT) TABS Take 2,000 Units by mouth in the morning.     denosumab (PROLIA) 60 MG/ML SOSY injection Inject 60 mg into the skin every 6 (six) months.     ferrous sulfate 325 (65 FE) MG tablet Take 325 mg by mouth 2 (two) times daily with a meal.     fluticasone (FLONASE) 50 MCG/ACT nasal spray Place 2 sprays into both nostrils  daily as needed for allergies or rhinitis.     guaiFENesin (MUCINEX) 600 MG 12 hr tablet Take 2 tablets (1,200 mg total) by mouth 2 (two) times daily. 60 tablet 0   loratadine (CLARITIN) 10 MG tablet Take 10 mg by mouth in the morning.     Multiple Vitamin (MULTIVITAMIN WITH MINERALS) TABS tablet Take 1 tablet by mouth daily with breakfast.     OXYGEN Inhale 2 L/min into the lungs at bedtime.     pantoprazole (PROTONIX) 40 MG tablet Take 1 tablet (40 mg total) by mouth 2 (two) times daily. 60 tablet 1   predniSONE (DELTASONE) 20 MG tablet Take 40 mg  for 2 days then 30 mg  for 3 days then 20 mg for 2 days the 10 mg for 2 days then resume home dose of 5 mg daily. 20 tablet 0   Probiotic Product (PROBIOTIC DAILY) CAPS Take 1 capsule by mouth in the morning.     sertraline (ZOLOFT) 50 MG tablet TAKE 1 TABLET BY MOUTH EVERY DAY (Patient taking differently: Take 50 mg by mouth at bedtime.) 90 tablet 0   sulfamethoxazole-trimethoprim (BACTRIM DS) 800-160 MG tablet Take 1 tablet by mouth 2 (two) times daily.     Current Facility-Administered Medications on File Prior to Visit  Medication Dose Route Frequency Provider  Last Rate Last Admin   triamcinolone acetonide (KENALOG-40) injection 20 mg  20 mg Intra-articular Once CoxElnita Maxwell, MD       Past Medical History:  Diagnosis Date   Anemia    Aortic atherosclerosis (HCC)    Aspiration pneumonia due to inhalation of vomitus ([place]) 02/03/2022   Atrial fibrillation (HCC)    Back pain    COPD (chronic obstructive pulmonary disease) (HCC)    COPD exacerbation ([place]) 12/27/2021   COVID-19 12/24/2020   Diabetes mellitus without complication (HCC)    Elevated PSA    GAD (generalized anxiety disorder)    GERD (gastroesophageal reflux disease)    High cholesterol    Hypertension    Lingular pneumonia 01/05/2021   See cxr 01/06/20 rx with zpak/ cefipime x 5 days and then developed purulent sputum again 01/12/21 so rx levcaquin 500 mg daily x 7 days then return for  cxr before more    Lung nodule < 6cm on CT 05/29/2016   Osteoporosis    Oxygen deficiency    Pneumonia    January 2022   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 Quentin Ore with a 20 mm Watchman FLX device   RSV bronchitis 01/27/2022   Vertebral compression fracture ([place]) 05/29/2019   T8 compression fracture noted on CT scan 05/28/2019 inpatient with chronic steroid dependent COPD - rx calcitonin nasal spray rx per PCP    Past Surgical History:  Procedure Laterality Date   CATARACT EXTRACTION Right    IR KYPHO EA ADDL LEVEL THORACIC OR LUMBAR  11/28/2021   LEFT ATRIAL APPENDAGE OCCLUSION N/A 08/02/2021   Procedure: LEFT ATRIAL APPENDAGE OCCLUSION;  Surgeon: Sherren Mocha, MD;  Location: [facility];  Service: Cardiovascular;  Laterality: N/A;   SKIN SURGERY     shoulders and chest   TEE WITHOUT CARDIOVERSION N/A 08/02/2021   Procedure: TRANSESOPHAGEAL ECHOCARDIOGRAM (TEE);  Surgeon: Sherren Mocha, MD;  Location: [facility];  Service: Cardiovascular;  Laterality: N/A;   TEE WITHOUT CARDIOVERSION N/A 09/13/2021   Procedure: TRANSESOPHAGEAL ECHOCARDIOGRAM (TEE);  Surgeon: Sanda Klein, MD;  Location: [facility];  Service: Cardiovascular;  Laterality: N/A;    Family History  Problem Relation Age of Onset   Heart disease Brother    Heart disease Mother    Heart disease Father    Cancer Paternal Uncle    Social History   Socioeconomic History   Marital status: Married    Spouse name: Not on file   Number of children: 3   Years of education: Not on file   Highest education level: Not on file  Occupational History   Occupation: retired    Comment: truck driver  Tobacco Use   Smoking status: Former    Packs/day: 2.00    Years: 52.00    Total pack years: 104.00    Types: Cigarettes    Quit date: 02/03/2009    Years since quitting: 13.5   Smokeless tobacco: Never   Tobacco comments:    Counseled to remain smoke free   Vaping Use   Vaping Use: Never used  Substance and Sexual Activity   Alcohol use: Not Currently    Alcohol/week: 0.0 standard drinks of alcohol    Comment: occ   Drug use: No   Sexual activity: Not on file  Other Topics Concern   Not on file  Social History Narrative   Originally from [place]. Previously worked driving a Restaurant manager, fast food. No  pets currently. No mold exposure. 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       wears sunscreen, brushes and flosses daily, see's dentist bi-annually, has smoke/carbon monoxide detectors, wears a seatbelt and practices gun safety      Social Determinants of Health   Financial Resource Strain: Medium Risk (03/14/2022)   Overall Financial Resource Strain (CARDIA)    Difficulty of Paying Living Expenses: Somewhat hard  Food Insecurity: No Food Insecurity (02/15/2022)   Hunger Vital Sign    Worried About Running Out of Food in the Last Year: Never true    Ran Out of Food in the Last Year: Never true  Transportation Needs: No Transportation Needs (03/14/2022)   PRAPARE - Hydrologist (Medical): No    Lack of Transportation (Non-Medical): No  Physical Activity: Insufficiently Active (02/15/2022)   Exercise Vital Sign    Days of Exercise per Week: 7 days    Minutes of Exercise per Session: 10 min  Stress: No Stress Concern Present (02/15/2022)   [place]    Feeling of Stress : Only a little  Social Connections: Socially Integrated (02/15/2022)   Social Connection and Isolation Panel [NHANES]    Frequency of Communication with Friends and Family: More than three times a week    Frequency of Social Gatherings with Friends and Family: More than three times a week    Attends Religious Services: More than 4 times per year    Active Member of [employer] or Organizations: Yes    Attends Arts administrator: More than 4 times per year    Marital Status: Married    Review of Systems  Constitutional:  Negative for chills and fever.  HENT:  Negative for congestion, rhinorrhea and sore throat.   Respiratory:  Positive for cough and shortness of breath.   Cardiovascular:  Negative for chest pain and palpitations.  Gastrointestinal:  Negative for abdominal pain, constipation, diarrhea, nausea and vomiting.  Genitourinary:  Negative for dysuria and urgency.  Musculoskeletal:  Negative for arthralgias, back pain and myalgias.  Neurological:  Negative for dizziness, weakness and headaches.  Psychiatric/Behavioral:  Negative for dysphoric mood. The patient is not nervous/anxious.      Objective:  BP (!) 140/60   Pulse 73   Temp (!) 96.4 F (35.8 C)   Ht '5\' 6"'$  (1.676 m)   Wt 151 lb 3.2 oz (68.6 kg)   SpO2 93%   BMI 24.40 kg/m      07/25/2022   11:42 AM 07/24/2022   10:02 AM 07/23/2022   10:43 AM  BP/Weight  Systolic BP 299 242 683  Diastolic BP 50 60 60  Wt. (Lbs) 152 151.2 151.2  BMI 24.53 kg/m2 24.4 kg/m2 24.4 kg/m2    Physical Exam Vitals reviewed.  Constitutional:      Appearance: Normal appearance.  Neck:     Vascular: No carotid bruit.  Cardiovascular:     Rate and Rhythm: Normal rate and regular rhythm.     Heart sounds: Normal heart sounds.  Pulmonary:     Effort: Pulmonary effort is normal.     Breath sounds: Normal breath sounds. No wheezing, rhonchi or rales.  Abdominal:     General: Bowel sounds are normal.     Palpations: Abdomen is soft.     Tenderness: There is no abdominal tenderness.  Neurological:  Mental Status: He is alert.  Psychiatric:        Mood and Affect: Mood normal.        Behavior: Behavior normal.     Diabetic Foot Exam - Simple   No data filed      Lab Results  Component Value Date   WBC 8.1 07/17/2022   HGB 11.7 (L) 07/17/2022   HCT 38.5 (L) 07/17/2022   PLT 235 07/17/2022   GLUCOSE 116 (H) 07/20/2022   CHOL 146  02/11/2022   TRIG 104 02/11/2022   HDL 54 02/11/2022   LDLCALC 73 02/11/2022   ALT 20 07/16/2022   AST 27 07/16/2022   NA 140 07/20/2022   K 4.6 07/20/2022   CL 106 07/20/2022   CREATININE 0.86 07/20/2022   BUN 24 (H) 07/20/2022   CO2 26 07/20/2022   TSH 2.539 03/22/2019   INR 1.4 (H) 12/24/2020   HGBA1C 6.5 (H) 02/11/2022   MICROALBUR Negative 10/09/2021      Assessment & Plan:   Problem List Items Addressed This Visit       Respiratory   Chronic respiratory failure with hypoxia ([place]) - Primary    Oxygen dependent.  Keep follow up with Dr. Melvyn Novas.      COPD with acute exacerbation ([place])    Greatly improved.  Currently, on prednisone taper. In the past has gotten down to 5 mg daily, but not been able to stop.  I reviewed a peak flow. He needs to figure out his baseline as he weans prednisone. Given a peak flow meter.  I recommended discuss with Dr. Melvyn Novas for some guidance.  Using flutter at home.  Peak flows today were 200, 225, 200.       Relevant Orders   Peak flow  .  Meds ordered this encounter  Medications   DISCONTD: LORazepam (ATIVAN) 0.5 MG tablet    Sig: Take 1 tablet (0.5 mg total) by mouth daily as needed for anxiety.    Dispense:  30 tablet    Refill:  1    Orders Placed This Encounter  Procedures   Peak flow     Follow-up: Return for previously scheduled upcoming appointment..  An After Visit Summary was printed and given to the patient.  [name], MD [facility] [phone]

## 2022-07-24 ENCOUNTER — Encounter: Payer: Self-pay | Admitting: Adult Health

## 2022-07-24 ENCOUNTER — Ambulatory Visit (INDEPENDENT_AMBULATORY_CARE_PROVIDER_SITE_OTHER): Payer: Medicare Other | Admitting: Adult Health

## 2022-07-24 DIAGNOSIS — J441 Chronic obstructive pulmonary disease with (acute) exacerbation: Secondary | ICD-10-CM

## 2022-07-24 DIAGNOSIS — I5032 Chronic diastolic (congestive) heart failure: Secondary | ICD-10-CM

## 2022-07-24 DIAGNOSIS — J9611 Chronic respiratory failure with hypoxia: Secondary | ICD-10-CM | POA: Diagnosis not present

## 2022-07-24 NOTE — Patient Instructions (Addendum)
Continue on BREZTRI 2 puffs Twice daily, rinse after use.  Taper Prednisone as directed down to Prednisone '5mg'$   daily  Mucinex Twice daily  As needed  Cough/congestion  Flutter valve Three times a day  .  Albuterol Neb or inhaler as needed  Wear Oxygen 2l/m  with activity and At bedtime   to maintain O2 sats >88-90%.  Activity as tolerated.  Aspirations precautions as discussed.  Please contact office for sooner follow up if symptoms do not improve or worsen or seek emergency care  Follow up with Dr. Melvyn Novas  Or Dianah Pruett NP in 4-6 weeks and As needed   Check on [employer] battery replacement .

## 2022-07-24 NOTE — Assessment & Plan Note (Signed)
Appears compensated without evidence of volume overload on exam.  Continue current regimen 

## 2022-07-24 NOTE — Assessment & Plan Note (Signed)
Hypoxic respiratory failure maintained on oxygen.  No increased oxygen demand.  Recent hospitalization with acute on chronic hypoxic respiratory.  Patient required increased oxygen during hospital stay and did require BiPAP support due to severe respiratory decompensation.  Surprisingly patient does not have significant hypercarbia.  Plan  Patient Instructions  Continue on BREZTRI 2 puffs Twice daily, rinse after use.  Taper Prednisone as directed down to Prednisone '5mg'$   daily  Mucinex Twice daily  As needed  Cough/congestion  Flutter valve Three times a day  .  Albuterol Neb or inhaler as needed  Wear Oxygen 2l/m  with activity and At bedtime   to maintain O2 sats >88-90%.  Activity as tolerated.  Aspirations precautions as discussed.  Please contact office for sooner follow up if symptoms do not improve or worsen or seek emergency care  Follow up with Paul [name]  Or Daytona Retana NP in 4-6 weeks and As needed   Check on [employer] battery replacement .
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
## 2022-07-24 NOTE — Assessment & Plan Note (Signed)
Recent COPD exacerbation required hospitalization.  Seems this trigger was prompted by poor air quality and smoke exposure.  Patient is advised on avoiding these conditions if possible as he is high risk for decompensation. Patient has been tried on Daliresp but intolerant.  Have also tried to get a nocturnal noninvasive device or BiPAP to help with recurrent exacerbations and hospitalizations however outpatient ABGs did not show significant hypercarbia therefore insurance will not cover.  We will continue on aggressive triple therapy inhaler.  Albuterol nebulizers as needed.  Continue with flutter valve.  Activity as tolerated.  Plan  Patient Instructions  Continue on BREZTRI 2 puffs Twice daily, rinse after use.  Taper Prednisone as directed down to Prednisone '5mg'$   daily  Mucinex Twice daily  As needed  Cough/congestion  Flutter valve Three times a day  .  Albuterol Neb or inhaler as needed  Wear Oxygen 2l/m  with activity and At bedtime   to maintain O2 sats >88-90%.  Activity as tolerated.  Aspirations precautions as discussed.  Please contact office for sooner follow up if symptoms do not improve or worsen or seek emergency care  Follow up with Dr. Melvyn Novas  Or [name] NP in 4-6 weeks and As needed   Check on Simply Go battery replacement .

## 2022-07-25 ENCOUNTER — Encounter: Payer: Self-pay | Admitting: Cardiology

## 2022-07-25 ENCOUNTER — Other Ambulatory Visit: Payer: Self-pay | Admitting: Family Medicine

## 2022-07-25 ENCOUNTER — Telehealth: Payer: Self-pay

## 2022-07-25 ENCOUNTER — Ambulatory Visit (INDEPENDENT_AMBULATORY_CARE_PROVIDER_SITE_OTHER): Payer: Medicare Other | Admitting: Cardiology

## 2022-07-25 VITALS — BP 120/50 | HR 84 | Ht 66.0 in | Wt 152.0 lb

## 2022-07-25 DIAGNOSIS — I2510 Atherosclerotic heart disease of native coronary artery without angina pectoris: Secondary | ICD-10-CM | POA: Diagnosis not present

## 2022-07-25 DIAGNOSIS — J9611 Chronic respiratory failure with hypoxia: Secondary | ICD-10-CM

## 2022-07-25 DIAGNOSIS — I2584 Coronary atherosclerosis due to calcified coronary lesion: Secondary | ICD-10-CM

## 2022-07-25 MED ORDER — ALPRAZOLAM 0.5 MG PO TABS: 0.5000 mg | ORAL_TABLET | Freq: Two times a day (BID) | ORAL | 0 refills | Status: DC | PRN

## 2022-07-25 NOTE — Patient Instructions (Signed)
Medication Instructions:  The current medical regimen is effective;  continue present plan and medications.  *If you need a refill on your cardiac medications before your next appointment, please call your pharmacy*  Follow-Up: At [facility], you and your health needs are our priority.  As part of our continuing mission to provide you with exceptional heart care, we have created designated Provider Care Teams.  These Care Teams include your primary Cardiologist (physician) and Advanced Practice Providers (APPs -  Physician Assistants and Nurse Practitioners) who all work together to provide you with the care you need, when you need it.  We recommend signing up for the patient portal called "MyChart".  Sign up information is provided on this After Visit Summary.  MyChart is used to connect with patients for Virtual Visits (Telemedicine).  Patients are able to view lab/test results, encounter notes, upcoming appointments, etc.  Non-urgent messages can be sent to your provider as well.   To learn more about what you can do with MyChart, go to [email or web address].    Your next appointment:   6 month(s)  The format for your next appointment:   In Person  Provider:   Christen Bame, NP     Then, Candee Furbish, MD will plan to see you again in 1 year(s).{    Important Information About Sugar

## 2022-07-25 NOTE — Telephone Encounter (Signed)
Received fax from pharmacy:   Medication: lorazepam 0.5 mg tab   Comments: product backordered/unavailable; on back order, unable to receive for several weeks.   Please advise.  [name] 07/25/22 4:37 PM
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
## 2022-07-25 NOTE — Telephone Encounter (Signed)
Change lorazepam to alprazolam due to lorazepam being on back order. Dr. Tobie Poet

## 2022-07-26 NOTE — Telephone Encounter (Signed)
Made patient aware of change/reasoning. He VU.   Paul [name], [place] 07/26/22 8:46 AM

## 2022-07-29 DIAGNOSIS — M7021 Olecranon bursitis, right elbow: Secondary | ICD-10-CM | POA: Diagnosis not present

## 2022-08-02 ENCOUNTER — Telehealth: Payer: Self-pay | Admitting: *Deleted

## 2022-08-02 NOTE — Telephone Encounter (Signed)
   Pre-operative Risk Assessment    Patient Name: Paul [name]  DOB: [birth year] MRN: [id number]      Request for Surgical Clearance    Procedure:   RIGHT ELBOW OLECRANON BURSECTOMY, I&D  ; PT HAS AN INFECTION  Date of Surgery:  Clearance 08/06/22 STAT                               Surgeon:  DR. Orene Desanctis Surgeon's Group or Practice Name:  [facility] Phone number:  [phone] Fax number:  [phone] ATTN: ASHLEY HILTON   Type of Clearance Requested:   - Medical , ASA  Type of Anesthesia:  Not Indicated; GENERAL?   Additional requests/questions:    [name]   08/02/2022, 10:30 AM

## 2022-08-04 ENCOUNTER — Encounter: Payer: Self-pay | Admitting: Family Medicine

## 2022-08-04 NOTE — Assessment & Plan Note (Signed)
Greatly improved.  Currently, on prednisone taper. In the past has gotten down to 5 mg daily, but not been able to stop.  I reviewed a peak flow. He needs to figure out his baseline as he weans prednisone. Given a peak flow meter.  I recommended discuss with Dr. Melvyn Novas for some guidance.  Using flutter at home.  Peak flows today were 200, 225, 200.

## 2022-08-04 NOTE — Assessment & Plan Note (Signed)
Oxygen dependent.  Keep follow up with Dr. Melvyn Novas.

## 2022-08-05 ENCOUNTER — Telehealth: Payer: Self-pay | Admitting: Adult Health

## 2022-08-05 DIAGNOSIS — J9611 Chronic respiratory failure with hypoxia: Secondary | ICD-10-CM

## 2022-08-05 NOTE — Telephone Encounter (Signed)
   Patient Name: Paul [name]  DOB: 12/15/1946 MRN: [id number]  Primary Cardiologist: Candee Furbish, MD  Chart reviewed as part of pre-operative protocol coverage. Patient was recently seen by Dr. Marlou Porch on 07/25/2022 at which time he was doing well from a cardiac standpoint. Per Dr. Marlou Porch, "OK to hold Aspirin and proceed with moderate cardiac risk for elbow procedure. His main concern is COPD."   I will route this recommendation to the requesting party via [facility] fax function and remove from pre-op pool.  Please call with questions.  Darreld Mclean, PA-C 08/05/2022, 7:54 AM

## 2022-08-06 DIAGNOSIS — M7021 Olecranon bursitis, right elbow: Secondary | ICD-10-CM | POA: Diagnosis not present

## 2022-08-06 NOTE — Telephone Encounter (Signed)
Called and spoke to spouse. And confirmed that patient was walked in office with TP on 07/24/2022. Placed order for POC and faxing it to company she requested. Nothing further needed

## 2022-08-10 DIAGNOSIS — R059 Cough, unspecified: Secondary | ICD-10-CM | POA: Diagnosis not present

## 2022-08-10 DIAGNOSIS — R062 Wheezing: Secondary | ICD-10-CM | POA: Diagnosis not present

## 2022-08-10 DIAGNOSIS — J441 Chronic obstructive pulmonary disease with (acute) exacerbation: Secondary | ICD-10-CM | POA: Diagnosis not present

## 2022-08-12 ENCOUNTER — Other Ambulatory Visit: Payer: Medicare Other

## 2022-08-12 DIAGNOSIS — D6869 Other thrombophilia: Secondary | ICD-10-CM | POA: Diagnosis not present

## 2022-08-12 DIAGNOSIS — I10 Essential (primary) hypertension: Secondary | ICD-10-CM | POA: Diagnosis not present

## 2022-08-12 DIAGNOSIS — I700 Atherosclerosis of aorta: Secondary | ICD-10-CM

## 2022-08-12 DIAGNOSIS — E1169 Type 2 diabetes mellitus with other specified complication: Secondary | ICD-10-CM | POA: Diagnosis not present

## 2022-08-12 DIAGNOSIS — E782 Mixed hyperlipidemia: Secondary | ICD-10-CM | POA: Diagnosis not present

## 2022-08-13 ENCOUNTER — Encounter: Payer: Self-pay | Admitting: Family Medicine

## 2022-08-13 LAB — COMPREHENSIVE METABOLIC PANEL
ALT: 57 IU/L — ABNORMAL HIGH (ref 0–44)
AST: 41 IU/L — ABNORMAL HIGH (ref 0–40)
Albumin/Globulin Ratio: 1.9 (ref 1.2–2.2)
Albumin: 4.4 g/dL (ref 3.8–4.8)
Alkaline Phosphatase: 84 IU/L (ref 44–121)
BUN/Creatinine Ratio: 14 (ref 10–24)
BUN: 14 mg/dL (ref 8–27)
Bilirubin Total: <0.2 mg/dL (ref 0.0–1.2)
CO2: 25 mmol/L (ref 20–29)
Calcium: 9.4 mg/dL (ref 8.6–10.2)
Chloride: 99 mmol/L (ref 96–106)
Creatinine, Ser: 1.02 mg/dL (ref 0.76–1.27)
Globulin, Total: 2.3 g/dL (ref 1.5–4.5)
Glucose: 89 mg/dL (ref 70–99)
Potassium: 4.5 mmol/L (ref 3.5–5.2)
Sodium: 139 mmol/L (ref 134–144)
Total Protein: 6.7 g/dL (ref 6.0–8.5)
eGFR: 76 mL/min/{1.73_m2} (ref >59)

## 2022-08-13 LAB — CBC WITH DIFF/PLATELET
Basophils Absolute: 0 10*3/uL (ref 0.0–0.2)
Basos: 0 %
EOS (ABSOLUTE): 0.1 10*3/uL (ref 0.0–0.4)
Eos: 1 %
Hematocrit: 41.7 % (ref 37.5–51.0)
Hemoglobin: 12.8 g/dL — ABNORMAL LOW (ref 13.0–17.7)
Immature Grans (Abs): 0 10*3/uL (ref 0.0–0.1)
Immature Granulocytes: 0 %
Lymphocytes Absolute: 3.6 10*3/uL — ABNORMAL HIGH (ref 0.7–3.1)
Lymphs: 45 %
MCH: 24.7 pg — ABNORMAL LOW (ref 26.6–33.0)
MCHC: 30.7 g/dL — ABNORMAL LOW (ref 31.5–35.7)
MCV: 80 fL (ref 79–97)
Monocytes Absolute: 0.7 10*3/uL (ref 0.1–0.9)
Monocytes: 9 %
Neutrophils Absolute: 3.6 10*3/uL (ref 1.4–7.0)
Neutrophils: 45 %
Platelets: 271 10*3/uL (ref 150–450)
RBC: 5.19 x10E6/uL (ref 4.14–5.80)
RDW: 14.6 % (ref 11.6–15.4)
WBC: 8 10*3/uL (ref 3.4–10.8)

## 2022-08-13 LAB — LIPID PANEL
Chol/HDL Ratio: 3.2 ratio (ref 0.0–5.0)
Cholesterol, Total: 197 mg/dL (ref 100–199)
HDL: 61 mg/dL (ref >39)
LDL Chol Calc (NIH): 110 mg/dL — ABNORMAL HIGH (ref 0–99)
Triglycerides: 152 mg/dL — ABNORMAL HIGH (ref 0–149)
VLDL Cholesterol Cal: 26 mg/dL (ref 5–40)

## 2022-08-13 LAB — CARDIOVASCULAR RISK ASSESSMENT

## 2022-08-13 LAB — HEMOGLOBIN A1C
Est. average glucose Bld gHb Est-mCnc: 134 mg/dL
Hgb A1c MFr Bld: 6.3 % — ABNORMAL HIGH (ref 4.8–5.6)

## 2022-08-13 NOTE — Progress Notes (Signed)
Blood count abnormal. Anemia improved.  Liver function normal.  Kidney function normal.  Cholesterol: LDL 110. Little high.  HBA1C: 6.3. Appt 8/25.

## 2022-08-16 ENCOUNTER — Ambulatory Visit (INDEPENDENT_AMBULATORY_CARE_PROVIDER_SITE_OTHER): Payer: Medicare Other | Admitting: Family Medicine

## 2022-08-16 ENCOUNTER — Encounter: Payer: Self-pay | Admitting: Family Medicine

## 2022-08-16 VITALS — BP 116/60 | HR 99 | Temp 97.1°F | Resp 18 | Ht 66.0 in | Wt 151.0 lb

## 2022-08-16 DIAGNOSIS — M8000XD Age-related osteoporosis with current pathological fracture, unspecified site, subsequent encounter for fracture with routine healing: Secondary | ICD-10-CM | POA: Diagnosis not present

## 2022-08-16 DIAGNOSIS — E782 Mixed hyperlipidemia: Secondary | ICD-10-CM

## 2022-08-16 DIAGNOSIS — D508 Other iron deficiency anemias: Secondary | ICD-10-CM | POA: Diagnosis not present

## 2022-08-16 DIAGNOSIS — J449 Chronic obstructive pulmonary disease, unspecified: Secondary | ICD-10-CM | POA: Diagnosis not present

## 2022-08-16 DIAGNOSIS — F411 Generalized anxiety disorder: Secondary | ICD-10-CM

## 2022-08-16 DIAGNOSIS — E1169 Type 2 diabetes mellitus with other specified complication: Secondary | ICD-10-CM | POA: Diagnosis not present

## 2022-08-16 MED ORDER — CEFTRIAXONE SODIUM 1 G IJ SOLR
1.0000 g | Freq: Once | INTRAMUSCULAR | Status: AC
  Administered 2022-08-16: 1 g via INTRAMUSCULAR

## 2022-08-16 MED ORDER — TRIAMCINOLONE ACETONIDE 40 MG/ML IJ SUSP
80.0000 mg | Freq: Once | INTRAMUSCULAR | Status: AC
  Administered 2022-08-16: 80 mg via INTRAMUSCULAR

## 2022-08-16 MED ORDER — PREDNISONE 20 MG PO TABS: ORAL_TABLET | ORAL | 0 refills | Status: AC

## 2022-08-16 NOTE — Assessment & Plan Note (Signed)
Prednisone 20 mg Take 3 tablets (60 mg total) by mouth daily with breakfast for 3 days, THEN 2 tablets (40 mg total) daily with breakfast for 3 days, THEN 1 tablet (20 mg total) daily with breakfast for 3 days. Then return to 10 mg daily.  Rocephin and kenalog injection given.

## 2022-08-16 NOTE — Assessment & Plan Note (Signed)
Well controlled.  Recommend check sugars fasting daily. Recommend check feet daily. Recommend annual eye exams. Medicines: none Continue to work on eating a healthy diet and exercise.

## 2022-08-16 NOTE — Assessment & Plan Note (Addendum)
The current medical regimen is effective;  continue present plan and medications. zoloft 50 mg once daily

## 2022-08-16 NOTE — Assessment & Plan Note (Signed)
The current medical regimen is effective;  continue present plan and medications.  

## 2022-08-16 NOTE — Patient Instructions (Signed)
Prednisone 20 mg Take 3 tablets (60 mg total) by mouth daily with breakfast for 3 days, THEN 2 tablets (40 mg total) daily with breakfast for 3 days, THEN 1 tablet (20 mg total) daily with breakfast for 3 days. Then return to 10 mg daily.   Given rocephin and kenalog shots.

## 2022-08-16 NOTE — Progress Notes (Signed)
Subjective:  Patient ID: Paul [name], male    DOB: September 15, 1946  Age: 76 y.o. MRN: [id number]  Chief Complaint  Patient presents with   Hypertension   Hyperlipidemia    HPI Diabetes:  Complications: none Glucose checking: no Glucose logs: no Hypoglycemia: no  Most recent A1C:6.3 Current medications:  none Last Eye Exam: summer 2022 Foot checks: daily  Hyperlipidemia: Current medications: on lipitor 40 mg once daily.   Hypertension/Atrial fibrillation: Current medications: eliquis, bisoprolol 5 mg qd, diltiazem 60 mg one qid prn, Lasix 20 mg qd. On plavix. Changed from eliquis due to high risk for bleeds/falls. Has had issues with hematuria per cardiology notes.   COPD/Chronic respiratory failure with hypoxia: ON breztri 2 puffs bid, prednisone 7.5 mg once daily, mucinex 600 mg one bid, Oxygen 2 -2.5 L at night and when ambulatory. Patient was seen at the [facility] last Saturday. Given shot, prednisone 7.5 mg daily, augmentin, and a cough syrup. Still coughing and wheezing. Patient is requiring oxygen more during the day - uses 2 L if does.   Iron deficiency anemia: questioning whether he needs to continue ferrous sulfate 325 mg twice daily.  GERD: on protonix 40 mg twice daily. GAD: on zoloft 50 mg once daily. Denies depression or anxiety. Has alprazolam once daily as needed. Very sparingly.  Osteoporosis: on prolia every 6 months and vitamin d 2000 U daily  Diet: Fairly healthy. Exercise: walks   Current Outpatient Medications on File Prior to Visit  Medication Sig Dispense Refill   acetaminophen (TYLENOL) 500 MG tablet Take 1,000 mg by mouth at bedtime as needed (pain).     albuterol (PROAIR HFA) 108 (90 Base) MCG/ACT inhaler Inhale 2 puffs into the lungs as needed for wheezing or shortness of breath.     ALPRAZolam (XANAX) 0.5 MG tablet Take 1 tablet (0.5 mg total) by mouth 2 (two) times daily as needed for anxiety. 30 tablet 0   amoxicillin-clavulanate (AUGMENTIN)  875-125 MG tablet Take 1 tablet by mouth 2 (two) times daily.     Ascorbic Acid (VITAMIN C) 1000 MG tablet Take 1,000 mg by mouth daily.     aspirin EC 81 MG tablet Take 1 tablet (81 mg total) by mouth daily. Swallow whole. 90 tablet 3   atorvastatin (LIPITOR) 40 MG tablet TAKE 1 TABLET BY MOUTH EVERYDAY AT BEDTIME 90 tablet 0   Budeson-Glycopyrrol-Formoterol (BREZTRI AEROSPHERE) 160-9-4.8 MCG/ACT AERO Inhale 2 puffs into the lungs 2 (two) times daily. 32.1 g 3   calcium carbonate (OS-CAL) 600 MG TABS tablet Take 600 mg by mouth daily.     Cholecalciferol (VITAMIN D3) 50 MCG (2000 UT) TABS Take 2,000 Units by mouth in the morning.     denosumab (PROLIA) 60 MG/ML SOSY injection Inject 60 mg into the skin every 6 (six) months.     ferrous sulfate 325 (65 FE) MG tablet Take 325 mg by mouth 2 (two) times daily with a meal.     fluticasone (FLONASE) 50 MCG/ACT nasal spray Place 2 sprays into both nostrils daily as needed for allergies or rhinitis.     guaiFENesin (MUCINEX) 600 MG 12 hr tablet Take 2 tablets (1,200 mg total) by mouth 2 (two) times daily. 60 tablet 0   loratadine (CLARITIN) 10 MG tablet Take 10 mg by mouth in the morning.     Multiple Vitamin (MULTIVITAMIN WITH MINERALS) TABS tablet Take 1 tablet by mouth daily with breakfast.     OXYGEN Inhale 2 L/min into the  lungs at bedtime.     pantoprazole (PROTONIX) 40 MG tablet Take 1 tablet (40 mg total) by mouth 2 (two) times daily. 60 tablet 1   Probiotic Product (PROBIOTIC DAILY) CAPS Take 1 capsule by mouth in the morning.     sertraline (ZOLOFT) 50 MG tablet TAKE 1 TABLET BY MOUTH EVERY DAY (Patient taking differently: Take 50 mg by mouth at bedtime.) 90 tablet 0   sulfamethoxazole-trimethoprim (BACTRIM DS) 800-160 MG tablet Take 1 tablet by mouth 2 (two) times daily.     Current Facility-Administered Medications on File Prior to Visit  Medication Dose Route Frequency Provider Last Rate Last Admin   triamcinolone acetonide (KENALOG-40)  injection 20 mg  20 mg Intra-articular Once [name], MD       Past Medical History:  Diagnosis Date   Anemia    Aortic atherosclerosis ([place])    Aspiration pneumonia due to inhalation of vomitus ([place]) 02/03/2022   Atrial fibrillation (HCC)    Back pain    COPD (chronic obstructive pulmonary disease) (HCC)    COPD exacerbation ([place]) 12/27/2021   COVID-19 12/24/2020   Diabetes mellitus without complication (HCC)    Elevated PSA    GAD (generalized anxiety disorder)    GERD (gastroesophageal reflux disease)    High cholesterol    Hypertension    Lingular pneumonia 01/05/2021   See cxr 01/06/20 rx with zpak/ cefipime x 5 days and then developed purulent sputum again 01/12/21 so rx levcaquin 500 mg daily x 7 days then return for cxr before more    Lung nodule < 6cm on CT 05/29/2016   Osteoporosis    Oxygen deficiency    Pneumonia    January 2022   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 Quentin Ore with a 20 mm Watchman FLX device   RSV bronchitis 01/27/2022   Vertebral compression fracture ([place]) 05/29/2019   T8 compression fracture noted on CT scan 05/28/2019 inpatient with chronic steroid dependent COPD - rx calcitonin nasal spray rx per PCP    Past Surgical History:  Procedure Laterality Date   CATARACT EXTRACTION Right    ELBOW SURGERY  07/2022   surgery on olecranon bursa. Dr. Greta Doom   IR KYPHO EA ADDL LEVEL THORACIC OR LUMBAR  11/28/2021   LEFT ATRIAL APPENDAGE OCCLUSION N/A 08/02/2021   Procedure: LEFT ATRIAL APPENDAGE OCCLUSION;  Surgeon: Sherren Mocha, MD;  Location: [facility];  Service: Cardiovascular;  Laterality: N/A;   SKIN SURGERY     shoulders and chest   TEE WITHOUT CARDIOVERSION N/A 08/02/2021   Procedure: TRANSESOPHAGEAL ECHOCARDIOGRAM (TEE);  Surgeon: Sherren Mocha, MD;  Location: [facility];  Service: Cardiovascular;  Laterality: N/A;   TEE WITHOUT CARDIOVERSION N/A 09/13/2021   Procedure: TRANSESOPHAGEAL  ECHOCARDIOGRAM (TEE);  Surgeon: Sanda Klein, MD;  Location: [facility];  Service: Cardiovascular;  Laterality: N/A;    Family History  Problem Relation Age of Onset   Heart disease Brother    Heart disease Mother    Heart disease Father    Cancer Paternal Uncle    Social History   Socioeconomic History   Marital status: Married    Spouse name: Not on file   Number of children: 3   Years of education: Not on file   Highest education level: Not on file  Occupational History   Occupation: retired    Comment: truck driver  Tobacco Use   Smoking status: Former    Packs/day:  2.00    Years: 52.00    Total pack years: 104.00    Types: Cigarettes    Quit date: 02/03/2009    Years since quitting: 13.5   Smokeless tobacco: Never   Tobacco comments:    Counseled to remain smoke free  Vaping Use   Vaping Use: Never used  Substance and Sexual Activity   Alcohol use: Not Currently    Alcohol/week: 0.0 standard drinks of alcohol    Comment: occ   Drug use: No   Sexual activity: Not on file  Other Topics Concern   Not on file  Social History Narrative   Originally from [place]. Previously worked driving a Restaurant manager, fast food. No pets currently. No mold exposure. 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       wears sunscreen, brushes and flosses daily, see's dentist bi-annually, has smoke/carbon monoxide detectors, wears a seatbelt and practices gun safety      Social Determinants of Health   Financial Resource Strain: Medium Risk (03/14/2022)   Overall Financial Resource Strain (CARDIA)    Difficulty of Paying Living Expenses: Somewhat hard  Food Insecurity: No Food Insecurity (02/15/2022)   Hunger Vital Sign    Worried About Running Out of Food in the Last Year: Never true    Ran Out of Food in the Last Year: Never true  Transportation Needs: No Transportation Needs (03/14/2022)   PRAPARE - Radiographer, therapeutic (Medical): No    Lack of Transportation (Non-Medical): No  Physical Activity: Insufficiently Active (02/15/2022)   Exercise Vital Sign    Days of Exercise per Week: 7 days    Minutes of Exercise per Session: 10 min  Stress: No Stress Concern Present (02/15/2022)   [place]    Feeling of Stress : Only a little  Social Connections: Socially Integrated (02/15/2022)   Social Connection and Isolation Panel [NHANES]    Frequency of Communication with Friends and Family: More than three times a week    Frequency of Social Gatherings with Friends and Family: More than three times a week    Attends Religious Services: More than 4 times per year    Active Member of [employer] or Organizations: Yes    Attends Music therapist: More than 4 times per year    Marital Status: Married    Review of Systems  Constitutional:  Negative for chills and fever.  HENT:  Negative for congestion, rhinorrhea and sore throat.   Respiratory:  Positive for cough, shortness of breath and wheezing.   Cardiovascular:  Negative for chest pain and palpitations.  Gastrointestinal:  Negative for abdominal pain, constipation, diarrhea, nausea and vomiting.  Genitourinary:  Negative for dysuria and urgency.  Musculoskeletal:  Positive for back pain. Negative for arthralgias and myalgias.  Neurological:  Negative for dizziness and headaches.  Psychiatric/Behavioral:  Negative for dysphoric mood. The patient is not nervous/anxious.     Objective:  BP 116/60   Pulse 99   Temp (!) 97.1 F (36.2 C)   Resp 18   Ht '5\' 6"'$  (1.676 m)   Wt 151 lb (68.5 kg)   SpO2 99%   BMI 24.37 kg/m      08/16/2022    8:14 AM 07/25/2022   11:42 AM 07/24/2022   10:02 AM  BP/Weight  Systolic BP 016 010 932  Diastolic BP 60 50 60  Wt. (Lbs) 151 152 151.2  BMI 24.37 kg/m2 24.53 kg/m2 24.4 kg/m2    Physical Exam Constitutional:      Appearance:  Normal appearance.  HENT:     Right Ear: Tympanic membrane, ear canal and external ear normal.     Left Ear: Tympanic membrane, ear canal and external ear normal.     Nose: Nose normal. No congestion or rhinorrhea.     Mouth/Throat:     Mouth: Mucous membranes are moist.     Pharynx: No oropharyngeal exudate or posterior oropharyngeal erythema.  Cardiovascular:     Rate and Rhythm: Normal rate and regular rhythm.     Heart sounds: Normal heart sounds.  Pulmonary:     Effort: Pulmonary effort is normal. No respiratory distress.     Breath sounds: Wheezing present. No rhonchi or rales.  Lymphadenopathy:     Cervical: No cervical adenopathy.  Neurological:     Mental Status: He is alert.  Psychiatric:        Mood and Affect: Mood normal.        Behavior: Behavior normal.     Diabetic Foot Exam - Simple   No data filed      Lab Results  Component Value Date   WBC 8.0 08/12/2022   HGB 12.8 (L) 08/12/2022   HCT 41.7 08/12/2022   PLT 271 08/12/2022   GLUCOSE 89 08/12/2022   CHOL 197 08/12/2022   TRIG 152 (H) 08/12/2022   HDL 61 08/12/2022   LDLCALC 110 (H) 08/12/2022   ALT 57 (H) 08/12/2022   AST 41 (H) 08/12/2022   NA 139 08/12/2022   K 4.5 08/12/2022   CL 99 08/12/2022   CREATININE 1.02 08/12/2022   BUN 14 08/12/2022   CO2 25 08/12/2022   TSH 2.539 03/22/2019   INR 1.4 (H) 12/24/2020   HGBA1C 6.3 (H) 08/12/2022   MICROALBUR Negative 10/09/2021      Assessment & Plan:   Problem List Items Addressed This Visit       Respiratory   COPD GOLD  III  (Chronic)    Prednisone 20 mg Take 3 tablets (60 mg total) by mouth daily with breakfast for 3 days, THEN 2 tablets (40 mg total) daily with breakfast for 3 days, THEN 1 tablet (20 mg total) daily with breakfast for 3 days. Then return to 10 mg daily.  Rocephin and kenalog injection given.      Relevant Medications   predniSONE (DELTASONE) 20 MG tablet     Endocrine   DM type 2 with diabetic mixed  hyperlipidemia ([place]) - Primary (Chronic)    Well controlled.  Recommend check sugars fasting daily. Recommend check feet daily. Recommend annual eye exams. Medicines: none Continue to work on eating a healthy diet and exercise.        Relevant Orders   Microalbumin/Creatinine Ratio, Urine (Completed)     Musculoskeletal and Integument   Age-related osteoporosis with healing pathological fracture, chronic back pain    The current medical regimen is effective;  continue present plan and medications.         Other   GAD (generalized anxiety disorder)    The current medical regimen is effective;  continue present plan and medications. zoloft 50 mg once daily      Iron 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    The current medical regimen is effective;  continue present plan and medications.     .  Meds ordered this encounter  Medications   predniSONE (DELTASONE) 20 MG tablet    Sig: Take 3 tablets (60 mg total) by mouth daily with breakfast for 3 days, THEN 2 tablets (40 mg total) daily with breakfast for 3 days, THEN 1 tablet (20 mg total) daily with breakfast for 3 days.    Dispense:  18 tablet    Refill:  0   triamcinolone acetonide (KENALOG-40) injection 80 mg   cefTRIAXone (ROCEPHIN) injection 1 g    Orders Placed This Encounter  Procedures   Microalbumin/Creatinine Ratio, Urine    Follow-up: Return in about 14 weeks (around 11/22/2022) for chronic fasting.  An After Visit Summary was printed and given to the patient.  [name], MD [facility] [phone]

## 2022-08-17 LAB — MICROALBUMIN / CREATININE URINE RATIO
Creatinine, Urine: 95.9 mg/dL
Microalb/Creat Ratio: 5 mg/g creat (ref 0–29)
Microalbumin, Urine: 4.8 ug/mL

## 2022-08-21 ENCOUNTER — Telehealth: Payer: Self-pay

## 2022-08-21 NOTE — Telephone Encounter (Signed)
Called to check in with patient, who had [place] on 08/02/2021. The patient reports doing well with no issues.  The patient understands to call with questions or concerns.

## 2022-08-22 ENCOUNTER — Other Ambulatory Visit: Payer: Self-pay

## 2022-08-22 DIAGNOSIS — I480 Paroxysmal atrial fibrillation: Secondary | ICD-10-CM | POA: Diagnosis not present

## 2022-08-22 DIAGNOSIS — I10 Essential (primary) hypertension: Secondary | ICD-10-CM | POA: Diagnosis not present

## 2022-08-22 DIAGNOSIS — J449 Chronic obstructive pulmonary disease, unspecified: Secondary | ICD-10-CM | POA: Diagnosis not present

## 2022-08-22 MED ORDER — ALBUTEROL SULFATE (2.5 MG/3ML) 0.083% IN NEBU: 2.5000 mg | INHALATION_SOLUTION | Freq: Four times a day (QID) | RESPIRATORY_TRACT | 5 refills | Status: DC | PRN

## 2022-08-23 ENCOUNTER — Encounter: Payer: Self-pay | Admitting: Family Medicine

## 2022-08-23 ENCOUNTER — Telehealth: Payer: Self-pay

## 2022-08-23 NOTE — Progress Notes (Signed)
Chronic Care Management Pharmacy Assistant   Name: Paul [name]  MRN: [id number] DOB: 11-Feb-1946   Reason for Encounter: Disease State call for HTN    Recent office visits:  08/16/22 [name] MD. Seen for routine visit. No med changes.  07/25/22 [name] MD. Orders Only. D/C Lorazepam 0.'5mg'$  and Started on Xanax 0.'5mg'$ .   07/23/22 Cox, Kirsten MD. Seen for respiratory failure. Increased Alprazolam to 0.'5mg'$ .   Recent consult visits:  07/25/22 (Cardiology) Candee Furbish MD. Seen for respiratory failure. No med changes.  07/24/22 (Pulmonology) Rexene Edison NP. Seen for COPD. No med changes.   Hospital visits:  Medication Reconciliation was completed by comparing discharge summary, patient's EMR and Pharmacy list, and upon discussion with patient.  Admitted to the hospital on 07/16/22 due to COPD. Discharge date was 07/21/22. Discharged from [facility]?Medications Started at [facility] Discharge:?? -started pantoprazole (PROTONIX) '40mg'$   Medication Changes at Hospital Discharge: -Changed  guaiFENesin ([place]) predniSONE (DELTASONE)  -All other medications will remain the same.    Medications: Outpatient Encounter Medications as of 08/23/2022  Medication Sig   acetaminophen (TYLENOL) 500 MG tablet Take 1,000 mg by mouth at bedtime as needed (pain).   albuterol (PROAIR HFA) 108 (90 Base) MCG/ACT inhaler Inhale 2 puffs into the lungs as needed for wheezing or shortness of breath.   albuterol (PROVENTIL) (2.5 MG/3ML) 0.083% nebulizer solution Take 3 mLs (2.5 mg total) by nebulization every 6 (six) hours as needed for shortness of breath or wheezing.   ALPRAZolam (XANAX) 0.5 MG tablet Take 1 tablet (0.5 mg total) by mouth 2 (two) times daily as needed for anxiety.   amoxicillin-clavulanate (AUGMENTIN) 875-125 MG tablet Take 1 tablet by mouth 2 (two) times daily.   Ascorbic Acid (VITAMIN C) 1000 MG tablet Take 1,000 mg by mouth daily.   aspirin EC 81 MG tablet Take 1  tablet (81 mg total) by mouth daily. Swallow whole.   atorvastatin (LIPITOR) 40 MG tablet TAKE 1 TABLET BY MOUTH EVERYDAY AT BEDTIME   Budeson-Glycopyrrol-Formoterol (BREZTRI AEROSPHERE) 160-9-4.8 MCG/ACT AERO Inhale 2 puffs into the lungs 2 (two) times daily.   calcium carbonate (OS-CAL) 600 MG TABS tablet Take 600 mg by mouth daily.   Cholecalciferol (VITAMIN D3) 50 MCG (2000 UT) TABS Take 2,000 Units by mouth in the morning.   denosumab (PROLIA) 60 MG/ML SOSY injection Inject 60 mg into the skin every 6 (six) months.   ferrous sulfate 325 (65 FE) MG tablet Take 325 mg by mouth 2 (two) times daily with a meal.   fluticasone (FLONASE) 50 MCG/ACT nasal spray Place 2 sprays into both nostrils daily as needed for allergies or rhinitis.   guaiFENesin (MUCINEX) 600 MG 12 hr tablet Take 2 tablets (1,200 mg total) by mouth 2 (two) times daily.   loratadine (CLARITIN) 10 MG tablet Take 10 mg by mouth in the morning.   Multiple Vitamin (MULTIVITAMIN WITH MINERALS) TABS tablet Take 1 tablet by mouth daily with breakfast.   OXYGEN Inhale 2 L/min into the lungs at bedtime.   pantoprazole (PROTONIX) 40 MG tablet Take 1 tablet (40 mg total) by mouth 2 (two) times daily.   predniSONE (DELTASONE) 20 MG tablet Take 3 tablets (60 mg total) by mouth daily with breakfast for 3 days, THEN 2 tablets (40 mg total) daily with breakfast for 3 days, THEN 1 tablet (20 mg total) daily with breakfast for 3 days.   Probiotic Product (PROBIOTIC DAILY) CAPS Take 1 capsule by  mouth in the morning.   sertraline (ZOLOFT) 50 MG tablet TAKE 1 TABLET BY MOUTH EVERY DAY (Patient taking differently: Take 50 mg by mouth at bedtime.)   sulfamethoxazole-trimethoprim (BACTRIM DS) 800-160 MG tablet Take 1 tablet by mouth 2 (two) times daily.   Facility-Administered Encounter Medications as of 08/23/2022  Medication   triamcinolone acetonide (KENALOG-40) injection 20 mg     Recent Office Vitals: BP Readings from Last 3 Encounters:   08/16/22 116/60  07/25/22 (!) 120/50  07/24/22 128/60   Pulse Readings from Last 3 Encounters:  08/16/22 99  07/25/22 84  07/24/22 71    Wt Readings from Last 3 Encounters:  08/16/22 151 lb (68.5 kg)  07/25/22 152 lb (68.9 kg)  07/24/22 151 lb 3.2 oz (68.6 kg)     Kidney Function Lab Results  Component Value Date/Time   CREATININE 1.02 08/12/2022 09:25 AM   CREATININE 0.86 07/20/2022 05:42 AM   GFRNONAA >60 07/20/2022 05:42 AM   GFRAA 107 11/20/2020 12:02 PM       Latest Ref Rng & Units 08/12/2022    9:25 AM 07/20/2022    5:42 AM 07/17/2022    3:06 AM  BMP  Glucose 70 - 99 mg/dL 89  116  149   BUN 8 - 27 mg/dL '14  24  12   '$ Creatinine 0.76 - 1.27 mg/dL 1.02  0.86  0.84   BUN/Creat Ratio 10 - 24 14     Sodium 134 - 144 mmol/L 139  140  138   Potassium 3.5 - 5.2 mmol/L 4.5  4.6  4.7   Chloride 96 - 106 mmol/L 99  106  105   CO2 20 - 29 mmol/L '25  26  24   '$ Calcium 8.6 - 10.2 mg/dL 9.4  8.3  8.5      Current antihypertensive regimen:  None  How often are you checking your Blood Pressure? daily  Current home BP readings: 08/23/22 141/84 08/24/22 131/86, 08/25/22 132/90 (Pt has been sick)   Wrist or arm cuff:Arm  Caffeine intake:None  Salt intake:Limited  OTC medications including pseudoephedrine or NSAIDs? Tylenol prn   Any readings above 180/120? No  What recent interventions/DTPs have been made by any provider to improve Blood Pressure control since last CPP Visit: No changes   Any recent hospitalizations or ED visits since last visit with CPP? Yes, for COPD  What diet changes have been made to improve Blood Pressure Control?  No changes  What exercise is being done to improve your Blood Pressure Control?  Pt is back going to the gym 3 days week but this past week he was sick and has not worked out   Adherence Review: Is the patient currently on ACE/ARB medication? No Does the patient have >5 day gap between last estimated fill dates? CPP to review  Care  Gaps: Last annual wellness visit?none noted   Star Rating Drugs:  Medication:  Last Fill: Day Supply  None noted     [name], [place] Pharmacist Assistant  [phone]

## 2022-08-26 ENCOUNTER — Other Ambulatory Visit: Payer: Self-pay | Admitting: Family Medicine

## 2022-09-04 ENCOUNTER — Encounter: Payer: Self-pay | Admitting: Adult Health

## 2022-09-04 ENCOUNTER — Ambulatory Visit (INDEPENDENT_AMBULATORY_CARE_PROVIDER_SITE_OTHER): Payer: Medicare Other | Admitting: Adult Health

## 2022-09-04 DIAGNOSIS — J9611 Chronic respiratory failure with hypoxia: Secondary | ICD-10-CM

## 2022-09-04 DIAGNOSIS — J449 Chronic obstructive pulmonary disease, unspecified: Secondary | ICD-10-CM | POA: Diagnosis not present

## 2022-09-04 DIAGNOSIS — I5032 Chronic diastolic (congestive) heart failure: Secondary | ICD-10-CM | POA: Diagnosis not present

## 2022-09-04 DIAGNOSIS — I2584 Coronary atherosclerosis due to calcified coronary lesion: Secondary | ICD-10-CM

## 2022-09-04 DIAGNOSIS — I2510 Atherosclerotic heart disease of native coronary artery without angina pectoris: Secondary | ICD-10-CM

## 2022-09-04 MED ORDER — PREDNISONE 10 MG PO TABS: 10.0000 mg | ORAL_TABLET | Freq: Every day | ORAL | 5 refills | Status: DC

## 2022-09-04 NOTE — Progress Notes (Signed)
$'@Patient'c$  ID: Paul [name], male    DOB: [birth year], 76 y.o.   MRN: [id number]  Chief Complaint  Patient presents with   Follow-up    Referring provider: [name], MD  HPI: 76 year old male former smoker quit in 2010 followed for severe COPD, steroid-dependent, and chronic respiratory failure on oxygen Prone to recurrent COPD exacerbations and hospitalizations Medical history significant for A-fib not a anticoagulation candidate due to history of severe bleeding secondary to hematuria, balance issues and frequent falls. He has been Daliresp intolerant. History of COVID-19 infection in January 2022 required hospitalization.  Hospitalization February 2023 for rhinovirus with COPD exacerbation and acute on chronic respiratory failure.  Hospitalization March 2023 for COPD exacerbation and COPD exacerbation with hospitalization July 2023  TEST/EVENTS :  COVID-19 infection January 2022 requiring hospitalization, hospitalization February 2023 for rhinovirus with COPD exacerbation and acute on chronic respiratory failure, hospitalization March 2023 for COPD exacerbation, hospitalization for COPD exacerbation July 2023   05/29/16: FVC 2.08 L (86%) FEV1 0.83 L (31%) FEV1/FVC 0.40 FEF 25-75 0.30 L (14%) no bronchodilator response TLC 6.56 L (107%) RV 167% ERV 160% DLCO corrected 60% (hemoglobin 10.9) 07/15/13: FVC 2.68 L (60%) FEV1 1.63 L (53%) FEV1/FVC 0.61 FEF 25-75 0.76 L (26%) 02/04/12: FVC 2.18 L (57%) FEV1 1.09 L (36%) FEV1/FVC 0.50 FEF 25-75 0.33 L (11%)     CT sinus February 2021 no acute sinus disease noted CT chest February 2021 emphysematous changes, several old thoracic compression fractures   2D echo July 2020 EF 60-65%,   Palliative care started 2021 (nurse comes every 2 weeks)    March 2023 outpatient ABG without hypercarbia-does not qualify for nocturnal BiPAP or NIV  09/04/2022 Follow up : COPD , O2 RF Patient returns for a follow-up visit.  Patient has underlying severe  COPD and is prone to recurrent exacerbations and hospitalizations.  Says he had a COPD flare about 3 weeks ago.  He was seen by his primary care provider and given antibiotics and a steroid taper.  Patient says he is feeling better.  Patient denies any hemoptysis, chest pain, orthopnea.  He remains on Breztri inhaler twice daily.  Is currently on prednisone 10 mg daily.  Patient says when he goes down below 10 mg he seems to have worsening symptoms.  He remains on oxygen 2 L with activity and at bedtime. Activity level is back to baseline.  He gets short of breath with heavy activities.    Allergies  Allergen Reactions   Tape Other (See Comments)    PLEASE USE COBAN WRAP IN LIEU OF "TAPE," as it "pulls off the skin!!   Daliresp [Roflumilast] Diarrhea and Nausea And Vomiting   Levaquin [Levofloxacin] Palpitations and Other (See Comments)    Made the B/P fluctuate and heart raced   Alendronate Anxiety and Other (See Comments)    Jittery/nervous    Immunization History  Administered Date(s) Administered   Fluad Quad(high Dose 65+) 10/02/2020, 10/09/2021   Influenza Split 09/22/2013, 09/01/2015   Influenza Whole 09/04/2011, 09/21/2012   Influenza, High Dose Seasonal PF 09/02/2016, 09/10/2017, 08/31/2018, 09/24/2018, 10/21/2019   Influenza-Unspecified 08/23/2014   Moderna Covid-19 Vaccine Bivalent Booster 9yr & up 10/09/2021   Moderna SARS-COV2 Booster Vaccination 07/05/2021   Moderna Sars-Covid-2 Vaccination 01/17/2020, 02/14/2020, 10/06/2020   Pneumococcal Conjugate-13 01/11/2014, 12/09/2016   Pneumococcal Polysaccharide-23 08/23/2010, 08/29/2011   Tdap 02/01/2019   Zoster Recombinat (Shingrix) 04/04/2014    Past Medical History:  Diagnosis Date   Anemia    Aortic  atherosclerosis ([place])    Aspiration pneumonia due to inhalation of vomitus ([place]) 02/03/2022   Atrial fibrillation (HCC)    Back pain    COPD (chronic obstructive pulmonary disease) (HCC)    COPD exacerbation ([place])  12/27/2021   COVID-19 12/24/2020   Diabetes mellitus without complication (HCC)    Elevated PSA    GAD (generalized anxiety disorder)    GERD (gastroesophageal reflux disease)    High cholesterol    Hypertension    Lingular pneumonia 01/05/2021   See cxr 01/06/20 rx with zpak/ cefipime x 5 days and then developed purulent sputum again 01/12/21 so rx levcaquin 500 mg daily x 7 days then return for cxr before more    Lung nodule < 6cm on CT 05/29/2016   Osteoporosis    Oxygen deficiency    Pneumonia    January 2022   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 Quentin Ore with a 20 mm Watchman FLX device   RSV bronchitis 01/27/2022   Vertebral compression fracture ([facility]) 05/29/2019   T8 compression fracture noted on CT scan 05/28/2019 inpatient with chronic steroid dependent COPD - rx calcitonin nasal spray rx per PCP     Tobacco History: Social History   Tobacco Use  Smoking Status Former   Packs/day: 2.00   Years: 52.00   Total pack years: 104.00   Types: Cigarettes   Quit date: 02/03/2009   Years since quitting: 13.5  Smokeless Tobacco Never  Tobacco Comments   Counseled to remain smoke free   Counseling given: Not Answered Tobacco comments: Counseled to remain smoke free   Outpatient Medications Prior to Visit  Medication Sig Dispense Refill   acetaminophen (TYLENOL) 500 MG tablet Take 1,000 mg by mouth at bedtime as needed (pain).     albuterol (PROAIR HFA) 108 (90 Base) MCG/ACT inhaler Inhale 2 puffs into the lungs as needed for wheezing or shortness of breath.     albuterol (PROVENTIL) (2.5 MG/3ML) 0.083% nebulizer solution Take 3 mLs (2.5 mg total) by nebulization every 6 (six) hours as needed for shortness of breath or wheezing. 360 mL 5   ALPRAZolam (XANAX) 0.5 MG tablet Take 1 tablet (0.5 mg total) by mouth 2 (two) times daily as needed for anxiety. 30 tablet 0   Ascorbic Acid (VITAMIN C) 1000 MG tablet Take 1,000 mg by mouth daily.      aspirin EC 81 MG tablet Take 1 tablet (81 mg total) by mouth daily. Swallow whole. 90 tablet 3   atorvastatin (LIPITOR) 40 MG tablet TAKE 1 TABLET BY MOUTH EVERYDAY AT BEDTIME 90 tablet 0   Budeson-Glycopyrrol-Formoterol (BREZTRI AEROSPHERE) 160-9-4.8 MCG/ACT AERO Inhale 2 puffs into the lungs 2 (two) times daily. 32.1 g 3   calcium carbonate (OS-CAL) 600 MG TABS tablet Take 600 mg by mouth daily.     Cholecalciferol (VITAMIN D3) 50 MCG (2000 UT) TABS Take 2,000 Units by mouth in the morning.     denosumab (PROLIA) 60 MG/ML SOSY injection Inject 60 mg into the skin every 6 (six) months.     ferrous sulfate 325 (65 FE) MG tablet Take 325 mg by mouth 2 (two) times daily with a meal.     fluticasone (FLONASE) 50 MCG/ACT nasal spray Place 2 sprays into both nostrils daily as needed for allergies or rhinitis.     guaiFENesin (MUCINEX) 600 MG 12 hr tablet Take 2 tablets (1,200 mg total) by mouth 2 (two) times daily. 60 tablet 0  loratadine (CLARITIN) 10 MG tablet Take 10 mg by mouth in the morning.     Multiple Vitamin (MULTIVITAMIN WITH MINERALS) TABS tablet Take 1 tablet by mouth daily with breakfast.     OXYGEN Inhale 2 L/min into the lungs at bedtime.     pantoprazole (PROTONIX) 40 MG tablet Take 1 tablet (40 mg total) by mouth 2 (two) times daily. 60 tablet 1   predniSONE (DELTASONE) 10 MG tablet Take 10 mg by mouth daily with breakfast. Pt is on 7.'5mg'$  at this time then goes down to '5mg'$  next week     Probiotic Product (PROBIOTIC DAILY) CAPS Take 1 capsule by mouth in the morning.     sertraline (ZOLOFT) 50 MG tablet TAKE 1 TABLET BY MOUTH EVERY DAY (Patient taking differently: Take 50 mg by mouth at bedtime.) 90 tablet 0   amoxicillin-clavulanate (AUGMENTIN) 875-125 MG tablet Take 1 tablet by mouth 2 (two) times daily.     sulfamethoxazole-trimethoprim (BACTRIM DS) 800-160 MG tablet Take 1 tablet by mouth 2 (two) times daily.     Facility-Administered Medications Prior to Visit  Medication Dose  Route Frequency Provider Last Rate Last Admin   triamcinolone acetonide (KENALOG-40) injection 20 mg  20 mg Intra-articular Once Cox, Kirsten, MD         Review of Systems:   Constitutional:   No  weight loss, night sweats,  Fevers, chills, + fatigue, or  lassitude.  HEENT:   No headaches,  Difficulty swallowing,  Tooth/dental problems, or  Sore throat,                No sneezing, itching, ear ache, nasal congestion, post nasal drip,   CV:  No chest pain,  Orthopnea, PND, swelling in lower extremities, anasarca, dizziness, palpitations, syncope.   GI  No heartburn, indigestion, abdominal pain, nausea, vomiting, diarrhea, change in bowel habits, loss of appetite, bloody stools.   Resp:   No chest wall deformity  Skin: no rash or lesions.  GU: no dysuria, change in color of urine, no urgency or frequency.  No flank pain, no hematuria   MS:  No joint pain or swelling.  No decreased range of motion.  No back pain.    Physical Exam  BP 128/62 (BP Location: Left Arm, Patient Position: Sitting, Cuff Size: Normal)   Pulse 79   Temp 98.6 F (37 C) (Oral)   Wt 151 lb 12.8 oz (68.9 kg)   SpO2 98%   BMI 24.50 kg/m   GEN: A/Ox3; pleasant , NAD, chronically ill-appearing   HEENT:  [place]/AT,   NOSE-clear, THROAT-clear, no lesions, no postnasal drip or exudate noted.   NECK:  Supple w/ fair ROM; no JVD; normal carotid impulses w/o bruits; no thyromegaly or nodules palpated; no lymphadenopathy.    RESP diminished breath sounds in the bases . no accessory muscle use, no dullness to percussion  CARD:  RRR, no m/r/g, no peripheral edema, pulses intact, no cyanosis or clubbing.  GI:   Soft & nt; nml bowel sounds; no organomegaly or masses detected.   Musco: Warm bil, no deformities or joint swelling noted.   Neuro: alert, no focal deficits noted.    Skin: Warm, no lesions or rashes    Lab Results:    BNP   ProBNP No results found for: "PROBNP"  Imaging: No results  found.      Latest Ref Rng & Units 05/29/2016    9:15 AM  PFT Results  FVC-Pre L 1.92   FVC-Predicted  Pre % 52   Pre FEV1/FVC % % 41   FEV1-Pre L 0.78   FEV1-Predicted Pre % 29     No results found for: "NITRICOXIDE"      Assessment & Plan:   No problem-specific Assessment & Plan notes found for this encounter.     Rexene Edison, NP 09/04/2022

## 2022-09-04 NOTE — Patient Instructions (Addendum)
Continue on BREZTRI 2 puffs Twice daily, rinse after use.  Hold at Prednisone '10mg'$  daily .  Mucinex Twice daily  As needed  Cough/congestion  Flutter valve Three times a day  .  Albuterol Neb or inhaler as needed  Wear Oxygen 2l/m  with activity and At bedtime   to maintain O2 sats >88-90%.  Activity as tolerated.  Aspirations precautions as discussed.  Please contact office for sooner follow up if symptoms do not improve or worsen or seek emergency care  Follow up with Dr. Melvyn Novas  Or Henleigh Robello NP in 6-8  weeks and As needed

## 2022-09-05 ENCOUNTER — Ambulatory Visit (INDEPENDENT_AMBULATORY_CARE_PROVIDER_SITE_OTHER): Payer: Medicare Other

## 2022-09-05 DIAGNOSIS — Z23 Encounter for immunization: Secondary | ICD-10-CM | POA: Diagnosis not present

## 2022-09-05 NOTE — Assessment & Plan Note (Signed)
Continue on oxygen to maintain O2 saturations greater than 88 to 90%  Plan  Patient Instructions  Continue on BREZTRI 2 puffs Twice daily, rinse after use.  Hold at Prednisone '10mg'$  daily .  Mucinex Twice daily  As needed  Cough/congestion  Flutter valve Three times a day  .  Albuterol Neb or inhaler as needed  Wear Oxygen 2l/m  with activity and At bedtime   to maintain O2 sats >88-90%.  Activity as tolerated.  Aspirations precautions as discussed.  Please contact office for sooner follow up if symptoms do not improve or worsen or seek emergency care  Follow up with Dr. Melvyn Novas  Or Audris Speaker NP in 6-8  weeks and As needed

## 2022-09-05 NOTE — Assessment & Plan Note (Addendum)
Compensated on present regimen, appears euvolemic on exam

## 2022-09-05 NOTE — Assessment & Plan Note (Signed)
Severe COPD prone to recurrent exacerbations and hospitalizations. Hold prednisone at 10 mg daily.  Continue on aggressive triple therapy maintenance inhaler  Plan  Patient Instructions  Continue on BREZTRI 2 puffs Twice daily, rinse after use.  Hold at Prednisone '10mg'$  daily .  Mucinex Twice daily  As needed  Cough/congestion  Flutter valve Three times a day  .  Albuterol Neb or inhaler as needed  Wear Oxygen 2l/m  with activity and At bedtime   to maintain O2 sats >88-90%.  Activity as tolerated.  Aspirations precautions as discussed.  Please contact office for sooner follow up if symptoms do not improve or worsen or seek emergency care  Follow up with Dr. Melvyn Novas  Or Saanvika Vazques NP in 6-8  weeks and As needed

## 2022-09-12 ENCOUNTER — Ambulatory Visit (INDEPENDENT_AMBULATORY_CARE_PROVIDER_SITE_OTHER): Payer: Medicare Other

## 2022-09-12 DIAGNOSIS — M8000XD Age-related osteoporosis with current pathological fracture, unspecified site, subsequent encounter for fracture with routine healing: Secondary | ICD-10-CM

## 2022-09-12 MED ORDER — DENOSUMAB 60 MG/ML ~~LOC~~ SOSY
60.0000 mg | PREFILLED_SYRINGE | Freq: Once | SUBCUTANEOUS | Status: AC
  Administered 2022-09-12: 60 mg via SUBCUTANEOUS

## 2022-09-15 ENCOUNTER — Other Ambulatory Visit: Payer: Self-pay | Admitting: Family Medicine

## 2022-09-21 DIAGNOSIS — J449 Chronic obstructive pulmonary disease, unspecified: Secondary | ICD-10-CM | POA: Diagnosis not present

## 2022-09-21 DIAGNOSIS — I4819 Other persistent atrial fibrillation: Secondary | ICD-10-CM | POA: Diagnosis not present

## 2022-09-23 ENCOUNTER — Telehealth: Payer: Self-pay

## 2022-09-23 NOTE — Progress Notes (Signed)
Chronic Care Management Pharmacy Assistant   Name: ZAYDRIAN BATTA  MRN: [id number] DOB: 07-May-1946   Reason for Encounter: Disease State call for HTN   Recent office visits:  None  Recent consult visits:  09/04/22 (Pulmonary Disease) Rexene Edison NP. Seen for COPD. Hold at Prednisone '10mg'$  daily .   Hospital visits:  None  Medications: Outpatient Encounter Medications as of 09/23/2022  Medication Sig   acetaminophen (TYLENOL) 500 MG tablet Take 1,000 mg by mouth at bedtime as needed (pain).   albuterol (PROAIR HFA) 108 (90 Base) MCG/ACT inhaler Inhale 2 puffs into the lungs as needed for wheezing or shortness of breath.   albuterol (PROVENTIL) (2.5 MG/3ML) 0.083% nebulizer solution Take 3 mLs (2.5 mg total) by nebulization every 6 (six) hours as needed for shortness of breath or wheezing.   ALPRAZolam (XANAX) 0.5 MG tablet Take 1 tablet (0.5 mg total) by mouth 2 (two) times daily as needed for anxiety.   Ascorbic Acid (VITAMIN C) 1000 MG tablet Take 1,000 mg by mouth daily.   aspirin EC 81 MG tablet Take 1 tablet (81 mg total) by mouth daily. Swallow whole.   atorvastatin (LIPITOR) 40 MG tablet TAKE 1 TABLET BY MOUTH EVERYDAY AT BEDTIME   Budeson-Glycopyrrol-Formoterol (BREZTRI AEROSPHERE) 160-9-4.8 MCG/ACT AERO Inhale 2 puffs into the lungs 2 (two) times daily.   calcium carbonate (OS-CAL) 600 MG TABS tablet Take 600 mg by mouth daily.   Cholecalciferol (VITAMIN D3) 50 MCG (2000 UT) TABS Take 2,000 Units by mouth in the morning.   denosumab (PROLIA) 60 MG/ML SOSY injection Inject 60 mg into the skin every 6 (six) months.   ferrous sulfate 325 (65 FE) MG tablet Take 325 mg by mouth 2 (two) times daily with a meal.   fluticasone (FLONASE) 50 MCG/ACT nasal spray Place 2 sprays into both nostrils daily as needed for allergies or rhinitis.   guaiFENesin (MUCINEX) 600 MG 12 hr tablet Take 2 tablets (1,200 mg total) by mouth 2 (two) times daily.   loratadine (CLARITIN) 10 MG tablet Take  10 mg by mouth in the morning.   Multiple Vitamin (MULTIVITAMIN WITH MINERALS) TABS tablet Take 1 tablet by mouth daily with breakfast.   OXYGEN Inhale 2 L/min into the lungs at bedtime.   pantoprazole (PROTONIX) 40 MG tablet Take 1 tablet (40 mg total) by mouth 2 (two) times daily.   predniSONE (DELTASONE) 10 MG tablet Take 1 tablet (10 mg total) by mouth daily with breakfast.   Probiotic Product (PROBIOTIC DAILY) CAPS Take 1 capsule by mouth in the morning.   sertraline (ZOLOFT) 50 MG tablet Take 1 tablet (50 mg total) by mouth at bedtime.   Facility-Administered Encounter Medications as of 09/23/2022  Medication   triamcinolone acetonide (KENALOG-40) injection 20 mg     Recent Office Vitals: BP Readings from Last 3 Encounters:  09/04/22 128/62  08/16/22 116/60  07/25/22 (!) 120/50   Pulse Readings from Last 3 Encounters:  09/04/22 79  08/16/22 99  07/25/22 84    Wt Readings from Last 3 Encounters:  09/04/22 151 lb 12.8 oz (68.9 kg)  08/16/22 151 lb (68.5 kg)  07/25/22 152 lb (68.9 kg)     Kidney Function Lab Results  Component Value Date/Time   CREATININE 1.02 08/12/2022 09:25 AM   CREATININE 0.86 07/20/2022 05:42 AM   GFRNONAA >60 07/20/2022 05:42 AM   GFRAA 107 11/20/2020 12:02 PM       Latest Ref Rng & Units 08/12/2022  9:25 AM 07/20/2022    5:42 AM 07/17/2022    3:06 AM  BMP  Glucose 70 - 99 mg/dL 89  116  149   BUN 8 - 27 mg/dL '14  24  12   '$ Creatinine 0.76 - 1.27 mg/dL 1.02  0.86  0.84   BUN/Creat Ratio 10 - 24 14     Sodium 134 - 144 mmol/L 139  140  138   Potassium 3.5 - 5.2 mmol/L 4.5  4.6  4.7   Chloride 96 - 106 mmol/L 99  106  105   CO2 20 - 29 mmol/L '25  26  24   '$ Calcium 8.6 - 10.2 mg/dL 9.4  8.3  8.5      Current antihypertensive regimen:  Diet and Exercise   How often are you checking your Blood Pressure? daily  he checks his blood pressure in the morning   Current home BP readings: 124/84, 133/92, 127/85   Wrist or arm cuff:Arm   Caffeine intake:None  Salt intake:Limited  OTC medications including pseudoephedrine or NSAIDs? Tylenol prn   Any readings above 180/120? No  What recent interventions/DTPs have been made by any provider to improve Blood Pressure control since last CPP Visit: No changes  Any recent hospitalizations or ED visits since last visit with CPP? No  What diet changes have been made to improve Blood Pressure Control?  No changes   What exercise is being done to improve your Blood Pressure Control?  Pt goes to the gym 3 days a week   Adherence Review: Is the patient currently on ACE/ARB medication? No Does the patient have >5 day gap between last estimated fill dates? CPP to review  Care Gaps: Last annual wellness visit?None noted   Star Rating Drugs:  Medication:  Last Fill: Day Supply  None noted    [name], [place] Pharmacist Assistant  [phone]

## 2022-10-02 ENCOUNTER — Other Ambulatory Visit: Payer: Self-pay

## 2022-10-02 MED ORDER — ALBUTEROL SULFATE (2.5 MG/3ML) 0.083% IN NEBU: 2.5000 mg | INHALATION_SOLUTION | Freq: Four times a day (QID) | RESPIRATORY_TRACT | 5 refills | Status: DC | PRN

## 2022-10-07 ENCOUNTER — Ambulatory Visit: Payer: Medicare Other | Admitting: Adult Health

## 2022-10-09 DIAGNOSIS — D485 Neoplasm of uncertain behavior of skin: Secondary | ICD-10-CM | POA: Diagnosis not present

## 2022-10-09 DIAGNOSIS — L814 Other melanin hyperpigmentation: Secondary | ICD-10-CM | POA: Diagnosis not present

## 2022-10-09 DIAGNOSIS — L820 Inflamed seborrheic keratosis: Secondary | ICD-10-CM | POA: Diagnosis not present

## 2022-10-09 DIAGNOSIS — D2239 Melanocytic nevi of other parts of face: Secondary | ICD-10-CM | POA: Diagnosis not present

## 2022-10-09 DIAGNOSIS — L821 Other seborrheic keratosis: Secondary | ICD-10-CM | POA: Diagnosis not present

## 2022-10-09 DIAGNOSIS — D225 Melanocytic nevi of trunk: Secondary | ICD-10-CM | POA: Diagnosis not present

## 2022-10-09 DIAGNOSIS — L570 Actinic keratosis: Secondary | ICD-10-CM | POA: Diagnosis not present

## 2022-10-22 DIAGNOSIS — J449 Chronic obstructive pulmonary disease, unspecified: Secondary | ICD-10-CM | POA: Diagnosis not present

## 2022-10-23 ENCOUNTER — Encounter: Payer: Self-pay | Admitting: Adult Health

## 2022-10-23 ENCOUNTER — Ambulatory Visit (INDEPENDENT_AMBULATORY_CARE_PROVIDER_SITE_OTHER): Payer: Medicare Other | Admitting: Adult Health

## 2022-10-23 DIAGNOSIS — J449 Chronic obstructive pulmonary disease, unspecified: Secondary | ICD-10-CM

## 2022-10-23 DIAGNOSIS — I2510 Atherosclerotic heart disease of native coronary artery without angina pectoris: Secondary | ICD-10-CM | POA: Diagnosis not present

## 2022-10-23 DIAGNOSIS — J9611 Chronic respiratory failure with hypoxia: Secondary | ICD-10-CM

## 2022-10-23 DIAGNOSIS — I5032 Chronic diastolic (congestive) heart failure: Secondary | ICD-10-CM | POA: Diagnosis not present

## 2022-10-23 DIAGNOSIS — I2584 Coronary atherosclerosis due to calcified coronary lesion: Secondary | ICD-10-CM

## 2022-10-23 MED ORDER — ALBUTEROL SULFATE (2.5 MG/3ML) 0.083% IN NEBU: 2.5000 mg | INHALATION_SOLUTION | Freq: Four times a day (QID) | RESPIRATORY_TRACT | 5 refills | Status: DC | PRN

## 2022-10-23 NOTE — Assessment & Plan Note (Addendum)
Severe COPD with significant symptom burden.  Recent mild flare improved with bump in prednisone dosing and albuterol neb use.  Patient is to slowly wean prednisone back down can begin prednisone 15 mg daily for 1 week then back to baseline at 10 mg daily. On return visit discuss Prevnar 20 vaccine.  Plan  Patient Instructions  Continue on BREZTRI 2 puffs Twice daily, rinse after use.  Continue on Prednisone '10mg'$  daily .  Mucinex Twice daily  As needed  Cough/congestion  Flutter valve Three times a day  .  Albuterol Neb or inhaler as needed  Wear Oxygen 2l/m  with activity and At bedtime   to maintain O2 sats >88-90%.  Activity as tolerated.  Covid booster as discussed.  Aspirations precautions as discussed.  Please contact office for sooner follow up if symptoms do not improve or worsen or seek emergency care  Follow up with Dr. Melvyn Novas  Or Sarp Vernier NP in 6-8 weeks and As needed

## 2022-10-23 NOTE — Patient Instructions (Addendum)
Continue on BREZTRI 2 puffs Twice daily, rinse after use.  Continue on Prednisone '10mg'$  daily .  Mucinex Twice daily  As needed  Cough/congestion  Flutter valve Three times a day  .  Albuterol Neb or inhaler as needed  Wear Oxygen 2l/m  with activity and At bedtime   to maintain O2 sats >88-90%.  Activity as tolerated.  Covid booster as discussed.  Aspirations precautions as discussed.  Please contact office for sooner follow up if symptoms do not improve or worsen or seek emergency care  Follow up with Dr. Melvyn Novas  Or Clorinda Wyble NP in 6-8 weeks and As needed

## 2022-10-23 NOTE — Assessment & Plan Note (Signed)
Appears euvolemic on exam.  Continue follow-up with cardiology 

## 2022-10-23 NOTE — Progress Notes (Signed)
$'@Patient't$  ID: Paul [name], male    DOB: [birth year], 76 y.o.   MRN: [id number]  Chief Complaint  Patient presents with   Follow-up    Referring provider: [name], MD  HPI: 76 year old male former smoker quit in 2010 followed for severe COPD with Emphysema , steroid-dependent and chronic respiratory failure on oxygen Prone to recurrent COPD exacerbations and hospitalizations Medical history significant for atrial fibrillation not on anticoagulation due to history of severe bleeding secondary to hematuria, balance issues and frequent falls Has been tried on Daliresp but was intolerant History of COVID-19 infection in January 2022 required hospitalization.  Hospitalized February 2023 for rhinovirus with COPD exacerbation and acute on chronic respiratory failure Hospitalized March 2023 and July 2023 for COPD exacerbation  TEST/EVENTS :  COVID-19 infection January 2022 requiring hospitalization, hospitalization February 2023 for rhinovirus with COPD exacerbation and acute on chronic respiratory failure, hospitalization March 2023 for COPD exacerbation, hospitalization for COPD exacerbation July 2023   05/29/16: FVC 2.08 L (86%) FEV1 0.83 L (31%) FEV1/FVC 0.40 FEF 25-75 0.30 L (14%) no bronchodilator response TLC 6.56 L (107%) RV 167% ERV 160% DLCO corrected 60% (hemoglobin 10.9) 07/15/13: FVC 2.68 L (60%) FEV1 1.63 L (53%) FEV1/FVC 0.61 FEF 25-75 0.76 L (26%) 02/04/12: FVC 2.18 L (57%) FEV1 1.09 L (36%) FEV1/FVC 0.50 FEF 25-75 0.33 L (11%)     CT sinus February 2021 no acute sinus disease noted CT chest February 2021 emphysematous changes, several old thoracic compression fractures   2D echo July 2020 EF 60-65%, 2D echo September 13, 2021 EF 60-65%, Watchman device,, grade 3 moderate layered plaque involving the descending aorta   Palliative care started 2021 (nurse comes every 2 weeks)    March 2023 outpatient ABG without hypercarbia-does not qualify for nocturnal BiPAP or  NIV  10/23/2022 Follow up: COPD , O2 RF  Patient returns for 6-week follow-up.  He is followed for severe COPD and oxygen dependent respiratory failure.  He is prone to recurrent exacerbations and hospitalizations.  Remains on Breztri twice daily.  He is on chronic steroids with prednisone 10 mg daily.  He is on oxygen 2 L with activity and at bedtime. Had little flare in wheezing last week, went up on Prednisone '20mg'$  daily.  Patient is feeling better.  Less cough and wheezing.  No discolored mucus or fever. Recently had his flu and RSV vaccine. Going for Covid booster later this month.  Trying to remain active, going to gym 3-4 days week, Treadmill and light weight.  Palliative care follows at home.  Going to [facility] next month for his 63-year anniversary. Remains independent drives goes out to dinner. Denies any chest pain orthopnea PND or increased leg swelling Wants refill prescription for his albuterol nebulizer printed with COPD diagnosis so he can get filed through Medicare be at his local pharmacy..  Allergies  Allergen Reactions   Tape Other (See Comments)    PLEASE USE COBAN WRAP IN LIEU OF "TAPE," as it "pulls off the skin!!   Daliresp [Roflumilast] Diarrhea and Nausea And Vomiting   Levaquin [Levofloxacin] Palpitations and Other (See Comments)    Made the B/P fluctuate and heart raced   Alendronate Anxiety and Other (See Comments)    Jittery/nervous    Immunization History  Administered Date(s) Administered   Fluad Quad(high Dose 65+) 10/02/2020, 10/09/2021, 09/05/2022   Influenza Split 09/22/2013, 09/01/2015   Influenza Whole 09/04/2011, 09/21/2012   Influenza, High Dose Seasonal PF 09/02/2016, 09/10/2017, 08/31/2018, 09/24/2018, 10/21/2019  Influenza-Unspecified 08/23/2014   Moderna Covid-19 Vaccine Bivalent Booster 40yr & up 10/09/2021   Moderna SARS-COV2 Booster Vaccination 07/05/2021   Moderna Sars-Covid-2 Vaccination 01/17/2020, 02/14/2020, 10/06/2020    Pneumococcal Conjugate-13 01/11/2014, 12/09/2016   Pneumococcal Polysaccharide-23 08/23/2010, 08/29/2011   Respiratory Syncytial Virus Vaccine,Recomb Aduvanted(Arexvy) 09/23/2022   Tdap 02/01/2019   Zoster Recombinat (Shingrix) 04/04/2014    Past Medical History:  Diagnosis Date   Anemia    Aortic atherosclerosis (HCC)    Aspiration pneumonia due to inhalation of vomitus ([facility] 02/03/2022   Atrial fibrillation (HCC)    Back pain    COPD (chronic obstructive pulmonary disease) (HCC)    [place] 12/27/2021   COVID-19 12/24/2020   Diabetes mellitus without complication (HCC)    Elevated PSA    GAD (generalized anxiety disorder)    GERD (gastroesophageal reflux disease)    High cholesterol    Hypertension    Lingular pneumonia 01/05/2021   See cxr 01/06/20 rx with zpak/ cefipime x 5 days and then developed purulent sputum again 01/12/21 so rx levcaquin 500 mg daily x 7 days then return for cxr before more    Lung nodule < 6cm on CT 05/29/2016   Osteoporosis    Oxygen deficiency    Pneumonia    January 2022   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 LQuentin Orewith a 20 mm Watchman FLX device   RSV bronchitis 01/27/2022   [facility] [place]   T8 compression fracture noted on CT scan 05/28/2019 inpatient with chronic steroid dependent COPD - rx calcitonin nasal spray rx per PCP     Tobacco History: Social History   Tobacco Use  Smoking Status Former   Packs/day: 2.00   Years: 52.00   Total pack years: 104.00   Types: Cigarettes   Quit date: 02/03/2009   Years since quitting: 13.7  Smokeless Tobacco Never  Tobacco Comments   Counseled to remain smoke free   Counseling given: Not Answered Tobacco comments: Counseled to remain smoke free   Outpatient Medications Prior to Visit  Medication Sig Dispense Refill   acetaminophen (TYLENOL) 500 MG tablet Take 1,000 mg by mouth at bedtime as needed (pain).      albuterol (PROAIR HFA) 108 (90 Base) MCG/ACT inhaler Inhale 2 puffs into the lungs as needed for wheezing or shortness of breath.     ALPRAZolam (XANAX) 0.5 MG tablet Take 1 tablet (0.5 mg total) by mouth 2 (two) times daily as needed for anxiety. 30 tablet 0   Ascorbic Acid (VITAMIN C) 1000 MG tablet Take 1,000 mg by mouth daily.     aspirin EC 81 MG tablet Take 1 tablet (81 mg total) by mouth daily. Swallow whole. 90 tablet 3   atorvastatin (LIPITOR) 40 MG tablet TAKE 1 TABLET BY MOUTH EVERYDAY AT BEDTIME 90 tablet 0   Budeson-Glycopyrrol-Formoterol (BREZTRI AEROSPHERE) 160-9-4.8 MCG/ACT AERO Inhale 2 puffs into the lungs 2 (two) times daily. 32.1 g 3   calcium carbonate (OS-CAL) 600 MG TABS tablet Take 600 mg by mouth daily.     Cholecalciferol (VITAMIN D3) 50 MCG (2000 UT) TABS Take 2,000 Units by mouth in the morning.     denosumab (PROLIA) 60 MG/ML SOSY injection Inject 60 mg into the skin every 6 (six) months.     ferrous sulfate 325 (65 FE) MG tablet Take 325 mg by mouth 2 (two) times daily with a meal.     fluticasone (FLONASE) 50  MCG/ACT nasal spray Place 2 sprays into both nostrils daily as needed for allergies or rhinitis.     guaiFENesin (MUCINEX) 600 MG 12 hr tablet Take 2 tablets (1,200 mg total) by mouth 2 (two) times daily. (Patient taking differently: Take 1,200 mg by mouth 2 (two) times daily. Taking 1 tablet twice a day) 60 tablet 0   loratadine (CLARITIN) 10 MG tablet Take 10 mg by mouth in the morning.     Multiple Vitamin (MULTIVITAMIN WITH MINERALS) TABS tablet Take 1 tablet by mouth daily with breakfast.     OXYGEN Inhale 2 L/min into the lungs at bedtime.     pantoprazole (PROTONIX) 40 MG tablet Take 1 tablet (40 mg total) by mouth 2 (two) times daily. 60 tablet 1   predniSONE (DELTASONE) 10 MG tablet Take 1 tablet (10 mg total) by mouth daily with breakfast. (Patient taking differently: Take 10 mg by mouth daily with breakfast. Take 2 tablets daily) 30 tablet 5    Probiotic Product (PROBIOTIC DAILY) CAPS Take 1 capsule by mouth in the morning.     sertraline (ZOLOFT) 50 MG tablet Take 1 tablet (50 mg total) by mouth at bedtime. 90 tablet 1   albuterol (PROVENTIL) (2.5 MG/3ML) 0.083% nebulizer solution Take 3 mLs (2.5 mg total) by nebulization every 6 (six) hours as needed for shortness of breath or wheezing. 360 mL 5   Facility-Administered Medications Prior to Visit  Medication Dose Route Frequency Provider Last Rate Last Admin   triamcinolone acetonide (KENALOG-40) injection 20 mg  20 mg Intra-articular Once Cox, Kirsten, MD         Review of Systems:   Constitutional:   No  weight loss, night sweats,  Fevers, chills,  +fatigue, or  lassitude.  HEENT:   No headaches,  Difficulty swallowing,  Tooth/dental problems, or  Sore throat,                No sneezing, itching, ear ache, nasal congestion, post nasal drip,   CV:  No chest pain,  Orthopnea, PND, swelling in lower extremities, anasarca, dizziness, palpitations, syncope.   GI  No heartburn, indigestion, abdominal pain, nausea, vomiting, diarrhea, change in bowel habits, loss of appetite, bloody stools.   Resp: .  No chest wall deformity  Skin: no rash or lesions.  GU: no dysuria, change in color of urine, no urgency or frequency.  No flank pain, no hematuria   MS:  No joint pain or swelling.  No decreased range of motion.  No back pain.    Physical Exam  BP 138/60 (BP Location: Left Arm, Cuff Size: Normal)   Pulse 85   Temp 97.9 F (36.6 C) (Temporal)   Ht '5\' 6"'$  (1.676 m)   Wt 161 lb 9.6 oz (73.3 kg)   SpO2 96%   BMI 26.08 kg/m   GEN: A/Ox3; pleasant , NAD, well nourished    HEENT:  [place]/AT,   NOSE-clear, THROAT-clear, no lesions, no postnasal drip or exudate noted.   NECK:  Supple w/ fair ROM; no JVD; normal carotid impulses w/o bruits; no thyromegaly or nodules palpated; no lymphadenopathy.    RESP few trace rhonchi no accessory muscle use, no dullness to  percussion  CARD:  RRR, no m/r/g, no peripheral edema, pulses intact, no cyanosis or clubbing.  GI:   Soft & nt; nml bowel sounds; no organomegaly or masses detected.   Musco: Warm bil, no deformities or joint swelling noted.   Neuro: alert, no focal deficits noted.  Skin: Warm, no lesions or rashes    Lab Results:  CBC    Component Value Date/Time   WBC 8.0 08/12/2022 0925   WBC 8.1 07/17/2022 0306   RBC 5.19 08/12/2022 0925   RBC 4.71 07/17/2022 0306   HGB 12.8 (L) 08/12/2022 0925   HCT 41.7 08/12/2022 0925   PLT 271 08/12/2022 0925   MCV 80 08/12/2022 0925   MCH 24.7 (L) 08/12/2022 0925   MCH 24.8 (L) 07/17/2022 0306   MCHC 30.7 (L) 08/12/2022 0925   MCHC 30.4 07/17/2022 0306   RDW 14.6 08/12/2022 0925   LYMPHSABS 3.6 (H) 08/12/2022 0925   MONOABS 0.3 07/16/2022 1320   EOSABS 0.1 08/12/2022 0925   BASOSABS 0.0 08/12/2022 0925    BMET    Component Value Date/Time   NA 139 08/12/2022 0925   K 4.5 08/12/2022 0925   CL 99 08/12/2022 0925   CO2 25 08/12/2022 0925   GLUCOSE 89 08/12/2022 0925   GLUCOSE 116 (H) 07/20/2022 0542   BUN 14 08/12/2022 0925   CREATININE 1.02 08/12/2022 0925   CALCIUM 9.4 08/12/2022 0925   GFRNONAA >60 07/20/2022 0542   GFRAA 107 11/20/2020 1202    BNP    Component Value Date/Time   BNP 48.8 07/16/2022 1320    ProBNP No results found for: "PROBNP"  Imaging: No results found.  denosumab (PROLIA) injection 60 mg     Date Action Dose Route User   09/12/2022 0933 Given 60 mg Subcutaneous (Right Arm) Oleta Mouse, CMA          Latest Ref Rng & Units 05/29/2016    9:15 AM  PFT Results  FVC-Pre L 1.92   FVC-Predicted Pre % 52   Pre FEV1/FVC % % 41   FEV1-Pre L 0.78   FEV1-Predicted Pre % 29     No results found for: "NITRICOXIDE"      Assessment & Plan:   COPD GOLD  III  Severe COPD with significant symptom burden.  Recent mild flare improved with bump in prednisone dosing and albuterol neb use.  Patient  is to slowly wean prednisone back down can begin prednisone 15 mg daily for 1 week then back to baseline at 10 mg daily.  Plan  Patient Instructions  Continue on BREZTRI 2 puffs Twice daily, rinse after use.  Continue on Prednisone '10mg'$  daily .  Mucinex Twice daily  As needed  Cough/congestion  Flutter valve Three times a day  .  Albuterol Neb or inhaler as needed  Wear Oxygen 2l/m  with activity and At bedtime   to maintain O2 sats >88-90%.  Activity as tolerated.  Covid booster as discussed.  Aspirations precautions as discussed.  Please contact office for sooner follow up if symptoms do not improve or worsen or seek emergency care  Follow up with Dr. Melvyn Novas  Or Teriana Danker NP in 6-8 weeks and As needed       Chronic respiratory failure with hypoxia ([place]) Chronic respiratory failure-continue on oxygen to maintain O2 saturations greater than 88 to 90%.  Chronic diastolic CHF (congestive heart failure) ([place]) Appears euvolemic on exam.  Continue follow-up with cardiology     Rexene Edison, NP 10/23/2022

## 2022-10-23 NOTE — Assessment & Plan Note (Signed)
Chronic respiratory failure-continue on oxygen to maintain O2 saturations greater than 88 to 90%.

## 2022-10-28 ENCOUNTER — Ambulatory Visit: Payer: Medicare Other | Admitting: Cardiology

## 2022-10-29 DIAGNOSIS — C44519 Basal cell carcinoma of skin of other part of trunk: Secondary | ICD-10-CM | POA: Diagnosis not present

## 2022-10-30 ENCOUNTER — Telehealth: Payer: Self-pay

## 2022-10-30 NOTE — Patient Outreach (Signed)
  Care Coordination   Initial Visit Note   10/30/2022 Name: Paul [name] MRN: [id number] DOB: [birth year]  Paul [name] is a 76 y.o. year old male who sees Cox, Kirsten, MD for primary care. I spoke with  [name] by phone today.  What matters to the patients health and wellness today?  Placed call to patient to review and offer [facility] care coordination program. Patient reports that he is active with Hospice    SDOH assessments and interventions completed:  No     Care Coordination Interventions Activated:  No  Care Coordination Interventions:  No, not indicated   Follow up plan: No further intervention required.   Encounter Outcome:  No Answer    Tomasa Rand, RN, BSN, CEN [place] Coordinator [phone]

## 2022-10-31 ENCOUNTER — Ambulatory Visit (INDEPENDENT_AMBULATORY_CARE_PROVIDER_SITE_OTHER): Payer: Medicare Other

## 2022-10-31 DIAGNOSIS — Z23 Encounter for immunization: Secondary | ICD-10-CM | POA: Diagnosis not present

## 2022-10-31 DIAGNOSIS — J449 Chronic obstructive pulmonary disease, unspecified: Secondary | ICD-10-CM

## 2022-10-31 DIAGNOSIS — I482 Chronic atrial fibrillation, unspecified: Secondary | ICD-10-CM

## 2022-10-31 DIAGNOSIS — F411 Generalized anxiety disorder: Secondary | ICD-10-CM

## 2022-10-31 NOTE — Progress Notes (Signed)
Chronic Care Management Pharmacy Note  10/31/2022 Name:  Paul [name] MRN:  [id number] DOB:  [birth year]   Plan Updates:  Marinell Blight PAP 2024  Subjective: Paul [name] is an 76 y.o. year old male who is a primary patient of Cox, Kirsten, MD.  The CCM team was consulted for assistance with disease management and care coordination needs.    Engaged with patient by telephone for follow up visit in response to provider referral for pharmacy case management and/or care coordination services.   Consent to Services:  The patient was given information about Chronic Care Management services, agreed to services, and gave verbal consent prior to initiation of services.  Please see initial visit note for detailed documentation.   Patient Care Team: [name], MD as PCP - General (Family Medicine) Jerline Pain, MD as PCP - Cardiology (Cardiology) Vickie Epley, MD as PCP - Electrophysiology (Cardiology) Elsie Stain, MD as Attending Physician (Pulmonary Disease) Tanda Rockers, MD as Consulting Physician (Pulmonary Disease) [facility] as Registered Nurse [facility] and Palliative Medicine) Lane Hacker, [facility] (Pharmacist)  Recent office visits:  None   Recent consult visits:  09/04/22 (Pulmonary Disease) Rexene Edison NP. Seen for COPD. Hold at Prednisone 65m daily .    Hospital visits:  None   Objective:  Lab Results  Component Value Date   CREATININE 1.02 08/12/2022   BUN 14 08/12/2022   GFRNONAA >60 07/20/2022   GFRAA 107 11/20/2020   NA 139 08/12/2022   K 4.5 08/12/2022   CALCIUM 9.4 08/12/2022   CO2 25 08/12/2022   GLUCOSE 89 08/12/2022    Lab Results  Component Value Date/Time   HGBA1C 6.3 (H) 08/12/2022 09:25 AM   HGBA1C 6.5 (H) 02/11/2022 08:19 AM   MICROALBUR Negative 10/09/2021 02:30 PM    Last diabetic Eye exam: No results found for: "HMDIABEYEEXA"  Last diabetic Foot exam: No results found for: "HMDIABFOOTEX"   Lab  Results  Component Value Date   CHOL 197 08/12/2022   HDL 61 08/12/2022   LDLCALC 110 (H) 08/12/2022   TRIG 152 (H) 08/12/2022   CHOLHDL 3.2 08/12/2022       Latest Ref Rng & Units 08/12/2022    9:25 AM 07/16/2022    1:20 PM 04/08/2022    2:21 PM  Hepatic Function  Total Protein 6.0 - 8.5 g/dL 6.7  7.3  6.5   Albumin 3.8 - 4.8 g/dL 4.4  4.5  4.1   AST 0 - 40 IU/L 41  27  23   ALT 0 - 44 IU/L 57  20  25   Alk Phosphatase 44 - 121 IU/L 84  74  96   Total Bilirubin 0.0 - 1.2 mg/dL <0.2  0.3  0.2     Lab Results  Component Value Date/Time   TSH 2.539 03/22/2019 06:21 AM       Latest Ref Rng & Units 08/12/2022    9:25 AM 07/17/2022    3:06 AM 07/16/2022    1:40 PM  CBC  WBC 3.4 - 10.8 x10E3/uL 8.0  8.1    Hemoglobin 13.0 - 17.7 g/dL 12.8  11.7  14.3   Hematocrit 37.5 - 51.0 % 41.7  38.5  42.0   Platelets 150 - 450 x10E3/uL 271  235      No results found for: "VD25OH"  Clinical ASCVD: No  The 10-year ASCVD risk score (Arnett DK, et al., 2019) is: 49%   Values used  to calculate the score:     Age: 76 years     Sex: Male     Is Non-Hispanic African American: No     Diabetic: Yes     Tobacco smoker: No     Systolic Blood Pressure: 003 mmHg     Is BP treated: No     HDL Cholesterol: 61 mg/dL     Total Cholesterol: 197 mg/dL       08/16/2022    8:20 AM 02/15/2022    8:33 AM 01/14/2022   11:14 AM  Depression screen PHQ 2/9  Decreased Interest 0 0 0  Down, Depressed, Hopeless 0 0 0  PHQ - 2 Score 0 0 0     Social History   Tobacco Use  Smoking Status Former   Packs/day: 2.00   Years: 52.00   Total pack years: 104.00   Types: Cigarettes   Quit date: 02/03/2009   Years since quitting: 13.7  Smokeless Tobacco Never  Tobacco Comments   Counseled to remain smoke free   BP Readings from Last 3 Encounters:  10/23/22 138/60  09/04/22 128/62  08/16/22 116/60   Pulse Readings from Last 3 Encounters:  10/23/22 85  09/04/22 79  08/16/22 99   Wt Readings from  Last 3 Encounters:  10/23/22 161 lb 9.6 oz (73.3 kg)  09/04/22 151 lb 12.8 oz (68.9 kg)  08/16/22 151 lb (68.5 kg)   BMI Readings from Last 3 Encounters:  10/23/22 26.08 kg/m  09/04/22 24.50 kg/m  08/16/22 24.37 kg/m    Assessment/Interventions: Review of patient past medical history, allergies, medications, health status, including review of consultants reports, laboratory and other test data, was performed as part of comprehensive evaluation and provision of chronic care management services.   SDOH:  (Social Determinants of Health) assessments and interventions performed: Yes SDOH Interventions    Flowsheet Row Chronic Care Management from 10/31/2022 in [place] Management from 03/14/2022 in [place] Office Visit from 02/15/2022 in [place] Management from 12/11/2021 in [place] Office Visit from 03/05/2021 in [place] Office Visit from 02/08/2021 in [place] Interventions        Food Insecurity Interventions -- -- Intervention Not Indicated -- -- --  Housing Interventions -- -- Intervention Not Indicated -- -- --  Transportation Interventions Intervention Not Indicated Intervention Not Indicated Intervention Not Indicated -- -- --  Depression Interventions/Treatment  -- -- -- -- PHQ2-9 Score <4 Follow-up Not Indicated Counseling, Medication  Financial Strain Interventions Other (Comment)  Paul [name] PAP] Other (Comment)  [PAP (See CP)] Intervention Not Indicated Other (Comment)  Paul [name] PAP] -- --  Physical Activity Interventions -- -- Intervention Not Indicated -- -- --  Stress Interventions -- -- --  [due to copd exacerbations] -- -- --  Social Connections Interventions -- -- Intervention Not Indicated -- -- --      SDOH Screenings   Food Insecurity: No Food Insecurity (02/15/2022)  Housing: Low Risk  (02/15/2022)  Transportation Needs: No Transportation Needs (10/31/2022)  Alcohol  Screen: Low Risk  (02/15/2022)  Depression (PHQ2-9): Low Risk  (08/16/2022)  Financial Resource Strain: Medium Risk (10/31/2022)  Physical Activity: Insufficiently Active (02/15/2022)  Social Connections: Socially Integrated (02/15/2022)  Stress: No Stress Concern Present (02/15/2022)  Tobacco Use: Medium Risk (10/23/2022)    CCM Care Plan  Allergies  Allergen Reactions   Tape Other (See Comments)    PLEASE USE COBAN WRAP IN LIEU OF "  TAPE," as it "pulls off the skin!!   Daliresp [Roflumilast] Diarrhea and Nausea And Vomiting   Levaquin [Levofloxacin] Palpitations and Other (See Comments)    Made the B/P fluctuate and heart raced   Alendronate Anxiety and Other (See Comments)    Jittery/nervous    Medications Reviewed Today     Reviewed by Lane Hacker, [facility] (Pharmacist) on 10/31/22 at 1424  Med List Status: <None>   Medication Order Taking? Sig Documenting Provider Last Dose Status Informant  acetaminophen (TYLENOL) 500 MG tablet [id number] No Take 1,000 mg by mouth at bedtime as needed (pain). [provider] Taking Active Self, Spouse/Significant Other, Pharmacy Records  albuterol (PROAIR HFA) 108 (90 Base) MCG/ACT inhaler [id number] No Inhale 2 puffs into the lungs as needed for wheezing or shortness of breath. [provider] Taking Active Spouse/Significant Other, Self, Pharmacy Records           Med Note [facility], CARLOS A   Thu Jul 25, 2022 11:43 AM)    albuterol (PROVENTIL) (2.5 MG/3ML) 0.083% nebulizer solution [id number]  Take 3 mLs (2.5 mg total) by nebulization every 6 (six) hours as needed for shortness of breath or wheezing. Parrett, Fonnie Mu, NP  Active   ALPRAZolam (XANAX) 0.5 MG tablet [id number] No Take 1 tablet (0.5 mg total) by mouth 2 (two) times daily as needed for anxiety. Cox, Kirsten, MD Taking Active   Ascorbic Acid (VITAMIN C) 1000 MG tablet [id number] No Take 1,000 mg by mouth daily. [provider] Taking Active Spouse/Significant  Other, Self, Pharmacy Records  aspirin EC 81 MG tablet [id number] No Take 1 tablet (81 mg total) by mouth daily. Swallow whole. Tommie Raymond, NP Taking Active Spouse/Significant Other, Self, Pharmacy Records  atorvastatin (LIPITOR) 40 MG tablet [id number] No TAKE 1 TABLET BY MOUTH EVERYDAY AT BEDTIME Marge Duncans, PA-C Taking Active   Budeson-Glycopyrrol-Formoterol (BREZTRI AEROSPHERE) 160-9-4.8 MCG/ACT AERO [id number] No Inhale 2 puffs into the lungs 2 (two) times daily. CoxElnita Maxwell, MD Taking Active Spouse/Significant Other, Self, Pharmacy Records           Med Note [facility], SCIBILIA A   Thu Jul 25, 2022 11:43 AM)    calcium carbonate (OS-CAL) 600 MG TABS tablet [id number] No Take 600 mg by mouth daily. [provider] Taking Active Spouse/Significant Other, Self, Pharmacy Records  Cholecalciferol (VITAMIN D3) 50 MCG (2000 UT) TABS [id number] No Take 2,000 Units by mouth in the morning. [provider] Taking Active Spouse/Significant Other, Self, Pharmacy Records  denosumab (PROLIA) 60 MG/ML SOSY injection [id number] No Inject 60 mg into the skin every 6 (six) months. [provider] Taking Active Spouse/Significant Other, Self, Pharmacy Records           Med Note [facility], CARLOS A   Mon Jan 28, 2022 11:00 AM)    ferrous sulfate 325 (65 FE) MG tablet [id number] No Take 325 mg by mouth 2 (two) times daily with a meal. [provider] Taking Active Spouse/Significant Other, Self, Pharmacy Records  fluticasone (FLONASE) 50 MCG/ACT nasal spray [id number] No Place 2 sprays into both nostrils daily as needed for allergies or rhinitis. [provider] Taking Active Self, Spouse/Significant Other, Pharmacy Records  guaiFENesin (MUCINEX) 600 MG 12 hr tablet [id number] No Take 2 tablets (1,200 mg total) by mouth 2 (two) times daily.  Patient taking differently: Take 1,200 mg by mouth 2 (two) times daily. Taking 1 tablet twice a day   Regalado, Cassie Freer, MD  Taking Active  loratadine (CLARITIN) 10 MG tablet [id number] No Take 10 mg by mouth in the morning. [provider] Taking Active Spouse/Significant Other, Self, Pharmacy Records  Multiple Vitamin (MULTIVITAMIN WITH MINERALS) TABS tablet [id number] No Take 1 tablet by mouth daily with breakfast. [provider] Taking Active Spouse/Significant Other, Self, Pharmacy Records  OXYGEN [id number] No Inhale 2 L/min into the lungs at bedtime. [provider] Taking Active Spouse/Significant Other, Self, Pharmacy Records  pantoprazole (PROTONIX) 40 MG tablet [id number] No Take 1 tablet (40 mg total) by mouth 2 (two) times daily. Regalado, Cassie Freer, MD Taking Active   predniSONE (DELTASONE) 10 MG tablet [id number] No Take 1 tablet (10 mg total) by mouth daily with breakfast.  Patient taking differently: Take 10 mg by mouth daily with breakfast. Take 2 tablets daily   Parrett, Tammy S, NP Taking Active   Probiotic Product (PROBIOTIC DAILY) CAPS [id number] No Take 1 capsule by mouth in the morning. [provider] Taking Active Spouse/Significant Other, Self, Pharmacy Records  sertraline (ZOLOFT) 50 MG tablet [id number] No Take 1 tablet (50 mg total) by mouth at bedtime. Cox, Elnita Maxwell, MD Taking Active   triamcinolone acetonide [facility]) injection 20 mg [id number]   [name], MD  Active             Patient Active Problem List   Diagnosis Date Noted   Iron 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 08/16/2022   Olecranon bursitis of right elbow 05/16/2022   Dizziness 04/08/2022   Syncope 04/08/2022   COPD with acute exacerbation ([place]) 03/02/2022   Type 2 diabetes mellitus ([place]) 03/02/2022   Diarrhea 02/14/2022   GERD (gastroesophageal reflux disease) / GI protection on antiplatelet therapy 12/27/2021   Lumbar back pain 11/17/2021   Acquired thrombophilia ([place]) 10/22/2021   Oxygen dependent 10/09/2021   Paroxysmal atrial fibrillation ([place])  08/02/2021   Chronic respiratory failure with hypoxia ([place]) 01/16/2021   GAD (generalized anxiety disorder) 01/14/2021   Chronic diastolic CHF (congestive heart failure) ([place]) 04/01/2020   Age-related osteoporosis with healing pathological fracture, chronic back pain 02/06/2020   Essential hypertension 01/28/2020   Encounter for general adult medical examination with abnormal findings 01/25/2020   Chronic bilateral thoracic back pain 01/25/2020   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[place]) 01/25/2020   HLD (hyperlipidemia) 01/05/2020   Chronic anemia 05/29/2019   Pulmonary nodules 05/29/2019   Coronary artery calcification 04/06/2019   Atherosclerosis of aorta ([place]) 04/06/2019   Acute on chronic respiratory failure with hypoxia ([place])    Former cigarette smoker 12/05/2015   COPD GOLD  III      Immunization History  Administered Date(s) Administered   COVID-19, mRNA, vaccine(Comirnaty)12 years and older 10/31/2022   Fluad Quad(high Dose 65+) 10/02/2020, 10/09/2021, 09/05/2022   Influenza Split 09/22/2013, 09/01/2015   Influenza Whole 09/04/2011, 09/21/2012   Influenza, High Dose Seasonal PF 09/02/2016, 09/10/2017, 08/31/2018, 09/24/2018, 10/21/2019   Influenza-Unspecified 08/23/2014   Moderna Covid-19 Vaccine Bivalent Booster 52yr & up 10/09/2021   Moderna SARS-COV2 Booster Vaccination 07/05/2021   Moderna Sars-Covid-2 Vaccination 01/17/2020, 02/14/2020, 10/06/2020   Pneumococcal Conjugate-13 01/11/2014, 12/09/2016   Pneumococcal Polysaccharide-23 08/23/2010, 08/29/2011   Respiratory Syncytial Virus Vaccine,Recomb Aduvanted(Arexvy) 09/23/2022   Tdap 02/01/2019   Zoster Recombinat (Shingrix) 04/04/2014    Conditions to be addressed/monitored:  Hypertension, Hyperlipidemia, Diabetes, Atrial Fibrillation and COPD  Care Plan : [place] Updates made by KLane Hacker [place] 10/31/2022 12:00 AM     Problem: afib, dm, copd, hld  Priority: High  Onset  Date: 02/14/2021     Long-Range Goal: Disease Management   Start Date: 02/14/2021  Expected End Date: 02/14/2022  Recent Progress: On track  Priority: High  Note:   Current Barriers:  Unable to independently afford treatment regimen  Pharmacist Clinical Goal(s):  Over the next 90 days, patient will verbalize ability to afford treatment regimen through collaboration with PharmD and provider.   Interventions: 1:1 collaboration with Cox, Elnita Maxwell, MD regarding development and update of comprehensive plan of care as evidenced by provider attestation and co-signature Inter-disciplinary care team collaboration (see longitudinal plan of care) Comprehensive medication review performed; medication list updated in electronic medical record  Depression/Anxiety -Controlled -Current treatment: Sertraline 26m QD Appropriate, Effective, Safe, Accessible -Medications previously tried/failed: N/A -PHQ9:     08/16/2022    8:20 AM 02/15/2022    8:33 AM 01/14/2022   11:14 AM  Depression screen PHQ 2/9  Decreased Interest 0 0 0  Down, Depressed, Hopeless 0 0 0  PHQ - 2 Score 0 0 0  -GAD7:     02/15/2022    8:57 AM  GAD 7 : Generalized Anxiety Score  Nervous, Anxious, on Edge 0  Control/stop worrying 1  Worry too much - different things 0  Trouble relaxing 1  Restless 1  Easily annoyed or irritable 0  Afraid - awful might happen 0  Total GAD 7 Score 3  Anxiety Difficulty Not difficult at all  -Educated on Benefits of medication for symptom control -Recommended to continue current medication   Hyperlipidemia: (LDL goal < 100) The 10-year ASCVD risk score (Arnett DK, et al., 2019) is: 49%   Values used to calculate the score:     Age: [age]years     Sex: Male     Is Non-Hispanic African American: No     Diabetic: Yes     Tobacco smoker: No     Systolic Blood Pressure: 1947mmHg     Is BP treated: No     HDL Cholesterol: 61 mg/dL     Total Cholesterol: 197 mg/dL Lab Results  Component  Value Date   CHOL 197 08/12/2022   CHOL 146 02/11/2022   CHOL 144 10/05/2021   Lab Results  Component Value Date   HDL 61 08/12/2022   HDL 54 02/11/2022   HDL 44 10/05/2021   Lab Results  Component Value Date   LDLCALC 110 (H) 08/12/2022   LDLCALC 73 02/11/2022   LDLCALC 70 10/05/2021   Lab Results  Component Value Date   TRIG 152 (H) 08/12/2022   TRIG 104 02/11/2022   TRIG 180 (H) 10/05/2021   Lab Results  Component Value Date   CHOLHDL 3.2 08/12/2022   CHOLHDL 2.7 02/11/2022   CHOLHDL 3.3 10/05/2021  No results found for: "LDLDIRECT" -Controlled -Current treatment: Atorvastatin 40 mg daily after supper Appropriate, Effective, Safe, Accessible -Medications previously tried: none reported  -Current dietary patterns: eats healthy -Current exercise habits: starting pulmonary rehab -Educated on Cholesterol goals;  Benefits of statin for ASCVD risk reduction; Importance of limiting foods high in cholesterol; -Counseled on diet and exercise extensively March 2023: Patient fell recently most likely due to Orthostasis. Patient's BP isn't extremely low and he states these numbers are normal for him. Counseled extensively about Orthostasis and ways to mitigate it. He states since the fall, he'll put his O2 on before he gets up, then he doesn't feel dizzy  Atrial Fibrillation (Goal: prevent stroke and major bleeding) -Controlled -CHADSVASC: 5 -  Current treatment: Rate control:  None Anticoagulation:  ASA 27m Appropriate, Effective, Safe, Accessible -Medications previously tried: Eliquis 5 mg bid (Watchman device 07/2021 - patient should be transitioning off Eliquis ~09/2021), Clopidogrel 744m(Will stop Feb 2023 then transition to ASA per Cardio note), Diltiazem, Bisprolol -Home BP and HR readings:  December 2022: 122/54 (Checks daily) March 2023:  03/14/22: 121/57, 85 03/13/22: 128/59, 79 03/12/22: 134/58, 83 -Counseled on importance of adherence to anticoagulant exactly  as prescribed; November 2023: Didn't have BP readings on him   COPD (Goal: control symptoms and prevent exacerbations) -Managed by Pulm, Tammy Parrett -Controlled -Current treatment  Albuterol inhaler 2 puffs every 6 hours prn for wheezing/shortness of breath ($45) Appropriate, Effective, Safe, Accessible Breztri 2 puffs bid (PAP) Appropriate, Effective, Safe, Accessible -Medications previously tried: Trelegy  -Gold Grade: Gold 3 (FEV1 30-49%)    10/23/2022    8:36 AM 05/28/2022    8:56 AM 01/10/2022    3:10 PM 02/05/2021    8:58 AM  CAT Score  Total CAT Score _0 PF Readings from Last 3 Encounters:  No data found for PF  Pulmonary Functions Testing Results: 05/29/16: FVC 2.08 L (86%) FEV1 0.83 L (31%) FEV1/FVC 0.40 FEF 25-75 0.30 L (14%) no bronchodilator response TLC 6.56 L (107%) RV 167% ERV 160% DLCO corrected 60% (hemoglobin 10.9) 07/15/13: FVC 2.68 L (60%) FEV1 1.63 L (53%) FEV1/FVC 0.61 FEF 25-75 0.76 L (26%) 02/04/12: FVC 2.18 L (57%) FEV1 1.09 L (36%) FEV1/FVC 0.50 FEF 25-75 0.33 L (11%) No results found for: "FEV1", "FVC", "FEV1FVC", "TLC", "DLCO"  -Exacerbations requiring treatment in last 6 months: yes -Patient reports consistent use of maintenance inhaler -Frequency of rescue inhaler use: using levalbuterol nebulizer prn -Counseled on Proper inhaler technique; Benefits of consistent maintenance inhaler use December 2022: Patient brought in PAP for Breztri for 2023 on 12/11/21, will make sure is faxed out today. Albuterol is expensive per patient, told him to call me next time he needs refills so I can print out a GoodRx coupon and hopefully get for $11 March 2022: F/U November to re-do PAP November 2023: Renew PAP  GERD (Goal: minimize symptoms of reflux ) -Controlled -Current treatment  Pantoprazole 4090mID Query Appropriate, Query effective, Query Safe, Query accessible -Medications previously tried: none reported  -Triggering factors: Patient rarely has  symptoms -Hx of Bleeds/ulcers: No per patient (03/14/22) -Counseled on small meals, elevating head, and sleeping on left side March 2023: Will ask PCP about Dc'ing since no longer on anticoag other than ASA -Confirmed with Cardio that it is OK to taper off Pantoprazole since patient wishes to  Recommend continue therapy  Anemia (Goal: fix Anemia) Lab Results  Component Value Date   WBC 8.0 08/12/2022   HGB 12.8 (L) 08/12/2022   HCT 41.7 08/12/2022   MCV 80 08/12/2022   PLT 271 08/12/2022   Lab Results  Component Value Date   IRON 61 04/05/2021   TIBC 241 (L) 04/05/2021   FERRITIN 280 04/05/2021  -Not ideally controlled -Current treatment  Ferrous Sulfate 325m43mD Appropriate, Query effective, Safe, Accessible -Medications previously tried: N/a  March 2023: Patient takes Iron [id number]+7001h food/snack. Takes PPI at 1800. Recommend patient doesn't take iron after PPI. Will ask PCP about possibly [place] since was on for Anticoagulation     Patient Goals/Self-Care Activities Over the next 90 days, patient will:  - take medications as prescribed  Follow Up Plan: Telephone follow up appointment with care management  team member scheduled for: May 2024 [place] Constable, [place].D. - [phone]        Medication Assistance:  Paul [name]: -Approved 2022 -Approved 2023 -Renewing for 2024  Patient's preferred pharmacy is:  [id number] [place], [place] - [address] [place]: [phone]: [address]: [phone]: [phone]  Uses pill box? Yes Pt endorses 100% compliance  We discussed: Current pharmacy is preferred with insurance plan and patient is satisfied with pharmacy services Patient decided to: Continue current medication management strategy  Care Plan and Follow Up Patient Decision:  Patient agrees to Care  Plan and Follow-up.  Plan: Telephone follow up appointment with care management team member scheduled for:  June 2024  NA[place] Constable PFloridaD. -- [phone]

## 2022-10-31 NOTE — Patient Instructions (Signed)
Visit Information   Goals Addressed   None    Patient Care Plan: CCM Pharmacy Care Plan     Problem Identified: afib, dm, copd, hld   Priority: High  Onset Date: 02/14/2021     Long-Range Goal: Disease Management   Start Date: 02/14/2021  Expected End Date: 02/14/2022  Recent Progress: On track  Priority: High  Note:   Current Barriers:  Unable to independently afford treatment regimen  Pharmacist Clinical Goal(s):  Over the next 90 days, patient will verbalize ability to afford treatment regimen through collaboration with PharmD and provider.   Interventions: 1:1 collaboration with [facility], Elnita Maxwell, MD regarding development and update of comprehensive plan of care as evidenced by provider attestation and co-signature Inter-disciplinary care team collaboration (see longitudinal plan of care) Comprehensive medication review performed; medication list updated in electronic medical record  Depression/Anxiety -Controlled -Current treatment: Sertraline '50mg'$  QD Appropriate, Effective, Safe, Accessible -Medications previously tried/failed: N/A -PHQ9:     08/16/2022    8:20 AM 02/15/2022    8:33 AM 01/14/2022   11:14 AM  Depression screen PHQ 2/9  Decreased Interest 0 0 0  Down, Depressed, Hopeless 0 0 0  PHQ - 2 Score 0 0 0  -GAD7:     02/15/2022    8:57 AM  GAD 7 : Generalized Anxiety Score  Nervous, Anxious, on Edge 0  Control/stop worrying 1  Worry too much - different things 0  Trouble relaxing 1  Restless 1  Easily annoyed or irritable 0  Afraid - awful might happen 0  Total GAD 7 Score 3  Anxiety Difficulty Not difficult at all  -Educated on Benefits of medication for symptom control -Recommended to continue current medication   Hyperlipidemia: (LDL goal < 100) The 10-year ASCVD risk score (Arnett DK, et al., 2019) is: 49%   Values used to calculate the score:     Age: 76 years     Sex: Male     Is Non-Hispanic African American: No     Diabetic: Yes      Tobacco smoker: No     Systolic Blood Pressure: 016 mmHg     Is BP treated: No     HDL Cholesterol: 61 mg/dL     Total Cholesterol: 197 mg/dL Lab Results  Component Value Date   CHOL 197 08/12/2022   CHOL 146 02/11/2022   CHOL 144 10/05/2021   Lab Results  Component Value Date   HDL 61 08/12/2022   HDL 54 02/11/2022   HDL 44 10/05/2021   Lab Results  Component Value Date   LDLCALC 110 (H) 08/12/2022   LDLCALC 73 02/11/2022   LDLCALC 70 10/05/2021   Lab Results  Component Value Date   TRIG 152 (H) 08/12/2022   TRIG 104 02/11/2022   TRIG 180 (H) 10/05/2021   Lab Results  Component Value Date   CHOLHDL 3.2 08/12/2022   CHOLHDL 2.7 02/11/2022   CHOLHDL 3.3 10/05/2021  No results found for: "LDLDIRECT" -Controlled -Current treatment: Atorvastatin 40 mg daily after supper Appropriate, Effective, Safe, Accessible -Medications previously tried: none reported  -Current dietary patterns: eats healthy -Current exercise habits: starting pulmonary rehab -Educated on Cholesterol goals;  Benefits of statin for ASCVD risk reduction; Importance of limiting foods high in cholesterol; -Counseled on diet and exercise extensively March 2023: Patient fell recently most likely due to Orthostasis. Patient's BP isn't extremely low and he states these numbers are normal for him. Counseled extensively about Orthostasis and ways to mitigate it. He  states since the fall, he'll put his O2 on before he gets up, then he doesn't feel dizzy  Atrial Fibrillation (Goal: prevent stroke and major bleeding) -Controlled -CHADSVASC: 5 -Current treatment: Rate control:  None Anticoagulation:  ASA '81mg'$  Appropriate, Effective, Safe, Accessible -Medications previously tried: Eliquis 5 mg bid (Watchman device 07/2021 - patient should be transitioning off Eliquis ~09/2021), Clopidogrel '75mg'$  (Will stop Feb 2023 then transition to ASA per Cardio note), Diltiazem, Bisprolol -Home BP and HR readings:  December  2022: 122/54 (Checks daily) March 2023:  03/14/22: 121/57, 85 03/13/22: 128/59, 79 03/12/22: 134/58, 83 -Counseled on importance of adherence to anticoagulant exactly as prescribed; November 2023: Didn't have BP readings on him   COPD (Goal: control symptoms and prevent exacerbations) -Managed by Pulm, Tammy Parrett -Controlled -Current treatment  Albuterol inhaler 2 puffs every 6 hours prn for wheezing/shortness of breath ($45) Appropriate, Effective, Safe, Accessible Breztri 2 puffs bid (PAP) Appropriate, Effective, Safe, Accessible -Medications previously tried: Trelegy  -Gold Grade: Gold 3 (FEV1 30-49%)    10/23/2022    8:36 AM 05/28/2022    8:56 AM 01/10/2022    3:10 PM 02/05/2021    8:58 AM  CAT Score  Total CAT Score '11 7 9 16   '$ PF Readings from Last 3 Encounters:  No data found for PF  Pulmonary Functions Testing Results: 05/29/16: FVC 2.08 L (86%) FEV1 0.83 L (31%) FEV1/FVC 0.40 FEF 25-75 0.30 L (14%) no bronchodilator response TLC 6.56 L (107%) RV 167% ERV 160% DLCO corrected 60% (hemoglobin 10.9) 07/15/13: FVC 2.68 L (60%) FEV1 1.63 L (53%) FEV1/FVC 0.61 FEF 25-75 0.76 L (26%) 02/04/12: FVC 2.18 L (57%) FEV1 1.09 L (36%) FEV1/FVC 0.50 FEF 25-75 0.33 L (11%) No results found for: "FEV1", "FVC", "FEV1FVC", "TLC", "DLCO"  -Exacerbations requiring treatment in last 6 months: yes -Patient reports consistent use of maintenance inhaler -Frequency of rescue inhaler use: using levalbuterol nebulizer prn -Counseled on Proper inhaler technique; Benefits of consistent maintenance inhaler use December 2022: Patient brought in PAP for Breztri for 2023 on 12/11/21, will make sure is faxed out today. Albuterol is expensive per patient, told him to call me next time he needs refills so I can print out a GoodRx coupon and hopefully get for $11 March 2022: F/U November to re-do PAP November 2023: Renew PAP  GERD (Goal: minimize symptoms of reflux ) -Controlled -Current treatment   Pantoprazole '40mg'$  BID Query Appropriate, Query effective, Query Safe, Query accessible -Medications previously tried: none reported  -Triggering factors: Patient rarely has symptoms -Hx of Bleeds/ulcers: No per patient (03/14/22) -Counseled on small meals, elevating head, and sleeping on left side March 2023: Will ask PCP about Dc'ing since no longer on anticoag other than ASA -Confirmed with Cardio that it is OK to taper off Pantoprazole since patient wishes to  Recommend continue therapy  Anemia (Goal: fix Anemia) Lab Results  Component Value Date   WBC 8.0 08/12/2022   HGB 12.8 (L) 08/12/2022   HCT 41.7 08/12/2022   MCV 80 08/12/2022   PLT 271 08/12/2022   Lab Results  Component Value Date   IRON 61 04/05/2021   TIBC 241 (L) 04/05/2021   FERRITIN 280 04/05/2021  -Not ideally controlled -Current treatment  Ferrous Sulfate '325mg'$  BID Appropriate, Query effective, Safe, Accessible -Medications previously tried: N/a  March 2023: Patient takes Iron [id number] with food/snack. Takes PPI at 1800. Recommend patient doesn't take iron after PPI. Will ask PCP about possibly [place] PPI since was on for Anticoagulation  Patient Goals/Self-Care Activities Over the next 90 days, patient will:  - take medications as prescribed  Follow Up Plan: Telephone follow up appointment with care management team member scheduled for: May 2024 Paul [name], [place].D. - [phone]      Paul [name] was given information about Chronic Care Management services today including:  CCM service includes personalized support from designated clinical staff supervised by his physician, including individualized plan of care and coordination with other care providers 24/7 contact phone numbers for assistance for urgent and routine care needs. Standard insurance, coinsurance, copays and deductibles apply for chronic care management only during months in which we provide at least 20 minutes of these  services. Most insurances cover these services at 100%, however patients may be responsible for any copay, coinsurance and/or deductible if applicable. This service may help you avoid the need for more expensive face-to-face services. Only one practitioner may furnish and bill the service in a calendar month. The patient may stop CCM services at any time (effective at the end of the month) by phone call to the office staff.  Patient agreed to services and verbal consent obtained.   The patient verbalized understanding of instructions, educational materials, and care plan provided today and DECLINED offer to receive copy of patient instructions, educational materials, and care plan.  The pharmacy team will reach out to the patient again over the next 60 days.   Lane Hacker, [place]

## 2022-11-05 ENCOUNTER — Telehealth: Payer: Self-pay | Admitting: Internal Medicine

## 2022-11-06 MED ORDER — PANTOPRAZOLE SODIUM 40 MG PO TBEC: 40.0000 mg | DELAYED_RELEASE_TABLET | Freq: Two times a day (BID) | ORAL | 3 refills | Status: DC

## 2022-11-06 NOTE — Telephone Encounter (Signed)
Rx for pt's pantoprazole has been sent to pharmacy for pt. Called and spoke with pt's spouse letting her know this had been done and she verbalized understanding. Nothing further needed.

## 2022-11-18 ENCOUNTER — Other Ambulatory Visit: Payer: Medicare Other

## 2022-11-18 ENCOUNTER — Telehealth: Payer: Self-pay

## 2022-11-18 DIAGNOSIS — E782 Mixed hyperlipidemia: Secondary | ICD-10-CM | POA: Diagnosis not present

## 2022-11-18 DIAGNOSIS — I10 Essential (primary) hypertension: Secondary | ICD-10-CM

## 2022-11-18 DIAGNOSIS — E1169 Type 2 diabetes mellitus with other specified complication: Secondary | ICD-10-CM

## 2022-11-18 NOTE — Progress Notes (Signed)
Acute Office Visit  Subjective:    Patient ID: Paul [name], male    DOB: Feb 17, 1946, 76 y.o.   MRN: [id number]  Chief Complaint  Patient presents with   COPD    COPD He complains of shortness of breath (Chronic COPD- harder than normal to catch his breath) and wheezing. There is no cough. Pertinent negatives include no appetite change, chest pain, ear pain, fever, headaches, myalgias or sore throat. His past medical history is significant for COPD.    Past Medical History:  Diagnosis Date   Anemia    Aortic atherosclerosis (HCC)    Aspiration pneumonia due to inhalation of vomitus ([place]) 02/03/2022   Atrial fibrillation (HCC)    Back pain    COPD (chronic obstructive pulmonary disease) (HCC)    COPD exacerbation ([place]) 12/27/2021   COVID-19 12/24/2020   Diabetes mellitus without complication (HCC)    Dizziness 04/08/2022   Elevated PSA    GAD (generalized anxiety disorder)    GERD (gastroesophageal reflux disease)    High cholesterol    Hypertension    Lingular pneumonia 01/05/2021   See cxr 01/06/20 rx with zpak/ cefipime x 5 days and then developed purulent sputum again 01/12/21 so rx levcaquin 500 mg daily x 7 days then return for cxr before more    Lung nodule < 6cm on CT 05/29/2016   Osteoporosis    Oxygen deficiency    Pneumonia    January 2022   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 Quentin Ore with a 20 mm Watchman FLX device   RSV bronchitis 01/27/2022   Vertebral compression fracture ([place]) 05/29/2019   T8 compression fracture noted on CT scan 05/28/2019 inpatient with chronic steroid dependent COPD - rx calcitonin nasal spray rx per PCP     Past Surgical History:  Procedure Laterality Date   CATARACT EXTRACTION Right    ELBOW SURGERY  07/2022   surgery on olecranon bursa. Dr. Greta Doom   IR KYPHO EA ADDL LEVEL THORACIC OR LUMBAR  11/28/2021   LEFT ATRIAL APPENDAGE OCCLUSION N/A 08/02/2021   Procedure: LEFT ATRIAL  APPENDAGE OCCLUSION;  Surgeon: Sherren Mocha, MD;  Location: [facility];  Service: Cardiovascular;  Laterality: N/A;   SKIN SURGERY     shoulders and chest   TEE WITHOUT CARDIOVERSION N/A 08/02/2021   Procedure: TRANSESOPHAGEAL ECHOCARDIOGRAM (TEE);  Surgeon: Sherren Mocha, MD;  Location: [facility];  Service: Cardiovascular;  Laterality: N/A;   TEE WITHOUT CARDIOVERSION N/A 09/13/2021   Procedure: TRANSESOPHAGEAL ECHOCARDIOGRAM (TEE);  Surgeon: Sanda Klein, MD;  Location[facility];  Service: Cardiovascular;  Laterality: N/A;    Family History  Problem Relation Age of Onset   Heart disease Brother    Heart disease Mother    Heart disease Father    Cancer Paternal Uncle     Social History   Socioeconomic History   Marital status: Married    Spouse name: Not on file   Number of children: 3   Years of education: Not on file   Highest education level: Not on file  Occupational History   Occupation: retired    Comment: truck driver  Tobacco Use   Smoking status: Former    Packs/day: 2.00    Years: 52.00    Total pack years: 104.00    Types: Cigarettes    Quit date: 02/03/2009    Years since quitting: 13.8   Smokeless tobacco: Never   Tobacco  comments:    Counseled to remain smoke free  Vaping Use   Vaping Use: Never used  Substance and Sexual Activity   Alcohol use: Not Currently    Alcohol/week: 0.0 standard drinks of alcohol    Comment: occ   Drug use: No   Sexual activity: Not on file  Other Topics Concern   Not on file  Social History Narrative   Originally from [place]. Previously worked driving a Restaurant manager, fast food. No pets currently. No mold exposure. 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       wears sunscreen, brushes and flosses daily, see's dentist bi-annually, has smoke/carbon monoxide detectors, wears a seatbelt and practices gun safety      Social Determinants of Health    Financial Resource Strain: Medium Risk (10/31/2022)   Overall Financial Resource Strain (CARDIA)    Difficulty of Paying Living Expenses: Somewhat hard  Food Insecurity: No Food Insecurity (02/15/2022)   Hunger Vital Sign    Worried About Running Out of Food in the Last Year: Never true    Ran Out of Food in the Last Year: Never true  Transportation Needs: No Transportation Needs (10/31/2022)   PRAPARE - Hydrologist (Medical): No    Lack of Transportation (Non-Medical): No  Physical Activity: Insufficiently Active (02/15/2022)   Exercise Vital Sign    Days of Exercise per Week: 7 days    Minutes of Exercise per Session: 10 min  Stress: No Stress Concern Present (02/15/2022)   [place]    Feeling of Stress : Only a little  Social Connections: Socially Integrated (02/15/2022)   Social Connection and Isolation Panel [NHANES]    Frequency of Communication with Friends and Family: More than three times a week    Frequency of Social Gatherings with Friends and Family: More than three times a week    Attends Religious Services: More than 4 times per year    Active Member of [employer] or Organizations: Yes    Attends Music therapist: More than 4 times per year    Marital Status: Married  Human resources officer Violence: Not At Risk (02/15/2022)   Humiliation, Afraid, Rape, and Kick questionnaire    Fear of Current or Ex-Partner: No    Emotionally Abused: No    Physically Abused: No    Sexually Abused: No    Outpatient Medications Prior to Visit  Medication Sig Dispense Refill   acetaminophen (TYLENOL) 500 MG tablet Take 1,000 mg by mouth at bedtime as needed (pain).     albuterol (PROAIR HFA) 108 (90 Base) MCG/ACT inhaler Inhale 2 puffs into the lungs as needed for wheezing or shortness of breath.     albuterol (PROVENTIL) (2.5 MG/3ML) 0.083% nebulizer solution Take 3 mLs (2.5 mg total) by  nebulization every 6 (six) hours as needed for shortness of breath or wheezing. 360 mL 5   ALPRAZolam (XANAX) 0.5 MG tablet Take 1 tablet (0.5 mg total) by mouth 2 (two) times daily as needed for anxiety. 30 tablet 0   Ascorbic Acid (VITAMIN C) 1000 MG tablet Take 1,000 mg by mouth daily.     aspirin EC 81 MG tablet Take 1 tablet (81 mg total) by mouth daily. Swallow whole. 90 tablet 3   atorvastatin (LIPITOR) 40 MG tablet TAKE 1 TABLET BY MOUTH EVERYDAY AT BEDTIME 90 tablet 0   Budeson-Glycopyrrol-Formoterol (  BREZTRI AEROSPHERE) 160-9-4.8 MCG/ACT AERO Inhale 2 puffs into the lungs 2 (two) times daily. 32.1 g 3   calcium carbonate (OS-CAL) 600 MG TABS tablet Take 600 mg by mouth daily.     Cholecalciferol (VITAMIN D3) 50 MCG (2000 UT) TABS Take 2,000 Units by mouth in the morning.     denosumab (PROLIA) 60 MG/ML SOSY injection Inject 60 mg into the skin every 6 (six) months.     ferrous sulfate 325 (65 FE) MG tablet Take 325 mg by mouth 2 (two) times daily with a meal.     fluticasone (FLONASE) 50 MCG/ACT nasal spray Place 2 sprays into both nostrils daily as needed for allergies or rhinitis.     guaiFENesin (MUCINEX) 600 MG 12 hr tablet Take 2 tablets (1,200 mg total) by mouth 2 (two) times daily. (Patient taking differently: Take 1,200 mg by mouth 2 (two) times daily. Taking 1 tablet twice a day) 60 tablet 0   loratadine (CLARITIN) 10 MG tablet Take 10 mg by mouth in the morning.     Multiple Vitamin (MULTIVITAMIN WITH MINERALS) TABS tablet Take 1 tablet by mouth daily with breakfast.     OXYGEN Inhale 2 L/min into the lungs at bedtime.     pantoprazole (PROTONIX) 40 MG tablet Take 1 tablet (40 mg total) by mouth 2 (two) times daily. 180 tablet 3   predniSONE (DELTASONE) 10 MG tablet Take 1 tablet (10 mg total) by mouth daily with breakfast. (Patient taking differently: Take 10 mg by mouth daily with breakfast. Take 2 tablets daily) 30 tablet 5   Probiotic Product (PROBIOTIC DAILY) CAPS Take 1  capsule by mouth in the morning.     sertraline (ZOLOFT) 50 MG tablet Take 1 tablet (50 mg total) by mouth at bedtime. 90 tablet 1   sulfamethoxazole-trimethoprim (BACTRIM DS) 800-160 MG tablet Take 1 tablet by mouth 2 (two) times daily.     triamcinolone acetonide (KENALOG-40) injection 20 mg      No facility-administered medications prior to visit.    Allergies  Allergen Reactions   Tape Other (See Comments)    PLEASE USE COBAN WRAP IN LIEU OF "TAPE," as it "pulls off the skin!!   Daliresp [Roflumilast] Diarrhea and Nausea And Vomiting   Levaquin [Levofloxacin] Palpitations and Other (See Comments)    Made the B/P fluctuate and heart raced   Alendronate Anxiety and Other (See Comments)    Jittery/nervous    Review of Systems  Constitutional:  Negative for appetite change, fatigue and fever.  HENT:  Negative for congestion, ear pain, sinus pressure and sore throat.   Respiratory:  Positive for shortness of breath (Chronic COPD- harder than normal to catch his breath) and wheezing. Negative for cough and chest tightness.   Cardiovascular:  Negative for chest pain and palpitations.  Gastrointestinal:  Negative for abdominal pain, constipation, diarrhea, nausea and vomiting.  Genitourinary:  Negative for dysuria and hematuria.  Musculoskeletal:  Negative for arthralgias, back pain, joint swelling and myalgias.  Skin:  Negative for rash.  Neurological:  Negative for dizziness, weakness and headaches.  Psychiatric/Behavioral:  Negative for dysphoric mood. The patient is not nervous/anxious.        Objective:    Physical Exam Vitals reviewed.  Constitutional:      Appearance: Normal appearance. He is normal weight.  HENT:     Right Ear: Tympanic membrane normal.     Left Ear: Tympanic membrane normal.     Nose: Nose normal.  Mouth/Throat:     Pharynx: No oropharyngeal exudate or posterior oropharyngeal erythema.  Cardiovascular:     Rate and Rhythm: Normal rate and regular  rhythm.     Heart sounds: Normal heart sounds.  Pulmonary:     Effort: Pulmonary effort is normal.     Breath sounds: Wheezing present.  Neurological:     Mental Status: He is alert and oriented to person, place, and time.  Psychiatric:        Mood and Affect: Mood normal.        Behavior: Behavior normal.     BP (!) 168/62 (BP Location: Left Arm, Patient Position: Sitting)   Pulse 98   Temp (!) 97.2 F (36.2 C) (Temporal)   Ht _0  (1.676 m)   Wt 164 lb (74.4 kg)   SpO2 95%   BMI 26.47 kg/m  Wt Readings from Last 3 Encounters:  11/22/22 164 lb (74.4 kg)  11/19/22 164 lb (74.4 kg)  10/23/22 161 lb 9.6 oz (73.3 kg)    Health Maintenance Due  Topic Date Due   OPHTHALMOLOGY EXAM  Never done   Hepatitis C Screening  Never done   Zoster Vaccines- Shingrix (2 of 2) 05/30/2014   Lung Cancer Screening  03/27/2021   COVID-19 Vaccine (5 - 2023-24 season) 08/23/2022    There are no preventive care reminders to display for this patient.   Lab Results  Component Value Date   TSH 2.539 03/22/2019   Lab Results  Component Value Date   WBC 9.0 11/18/2022   HGB 12.3 (L) 11/18/2022   HCT 38.4 11/18/2022   MCV 81 11/18/2022   PLT 305 11/18/2022   Lab Results  Component Value Date   NA 144 11/18/2022   K 4.3 11/18/2022   CO2 28 11/18/2022   GLUCOSE 99 11/18/2022   BUN 12 11/18/2022   CREATININE 0.95 11/18/2022   BILITOT 0.2 11/18/2022   ALKPHOS 80 11/18/2022   AST 27 11/18/2022   ALT 30 11/18/2022   PROT 6.5 11/18/2022   ALBUMIN 4.1 11/18/2022   CALCIUM 9.6 11/18/2022   ANIONGAP 8 07/20/2022   EGFR 82 11/18/2022   Lab Results  Component Value Date   CHOL 189 11/18/2022   Lab Results  Component Value Date   HDL 72 11/18/2022   Lab Results  Component Value Date   LDLCALC 93 11/18/2022   Lab Results  Component Value Date   TRIG 138 11/18/2022   Lab Results  Component Value Date   CHOLHDL 2.6 11/18/2022   Lab Results  Component Value Date   HGBA1C  6.3 (H) 11/18/2022         Assessment & Plan:   Problem List Items Addressed This Visit       Respiratory   COPD exacerbation ([place]) - Primary    Continue Bactrim DS until gone. Rocephin shot given today. Kenalog shot given today. Prednisone 20 mg 3 daily x3 days, then 2 daily x3 days, then 1 daily until seen by pulmonology in mid December.      Relevant Medications   predniSONE (DELTASONE) 20 MG tablet    Meds ordered this encounter  Medications   cefTRIAXone (ROCEPHIN) injection 1 g   triamcinolone acetonide (KENALOG-40) injection 80 mg   predniSONE (DELTASONE) 20 MG tablet    Sig: Take 3 tablets (60 mg total) by mouth daily with breakfast for 3 days, THEN 2 tablets (40 mg total) daily with breakfast for 3 days, THEN 1 tablet (20 mg  total) daily with breakfast for 24 days.    Dispense:  39 tablet    Refill:  0    I,Lauren M Auman,acting as a scribe for [name], MD.,have documented all relevant documentation on the behalf of [name], MD,as directed by  [name], MD while in the presence of [name], MD.   Follow up at the end of the week.   [name], MD

## 2022-11-18 NOTE — Telephone Encounter (Signed)
WG:NFAO Goynes came in this morning to have labs done, and illa is concerned about his breathing and he was too. I put the pulse ox on him while he was here and after standing up and sitting down his oxygen was 92 93, and he states after getting up and walking like down the hall way it would drop at home to 85 86. illa stated that she had moved his breathing treatments to every 4 hours and he's taking prednisone too as well. They are wanting to see you if you had an opening to listen to him, but you only have a chart review time this evening.    Georgian Co, MD the best I can do is have him come back and wait and I will try to fit him in. have him come at 3 pm.   ME: I got you I called them and they said they can't come this afternoon but they could come tomorrow because he is seeing Dr. Jimmye Norman this afternoon. I scheduled him in your chart block for tomorrow morning.  Patient has been scheduled to come in tomorrow and was instructed to go to the nearest ER if he starts to experience worsening symptoms.

## 2022-11-19 ENCOUNTER — Ambulatory Visit (INDEPENDENT_AMBULATORY_CARE_PROVIDER_SITE_OTHER): Payer: Medicare Other | Admitting: Family Medicine

## 2022-11-19 ENCOUNTER — Encounter: Payer: Self-pay | Admitting: Family Medicine

## 2022-11-19 VITALS — BP 168/62 | HR 98 | Temp 97.2°F | Ht 66.0 in | Wt 164.0 lb

## 2022-11-19 DIAGNOSIS — J441 Chronic obstructive pulmonary disease with (acute) exacerbation: Secondary | ICD-10-CM

## 2022-11-19 LAB — CBC WITH DIFF/PLATELET
Basophils Absolute: 0 10*3/uL (ref 0.0–0.2)
Basos: 0 %
EOS (ABSOLUTE): 0.1 10*3/uL (ref 0.0–0.4)
Eos: 1 %
Hematocrit: 38.4 % (ref 37.5–51.0)
Hemoglobin: 12.3 g/dL — ABNORMAL LOW (ref 13.0–17.7)
Immature Grans (Abs): 0 10*3/uL (ref 0.0–0.1)
Immature Granulocytes: 0 %
Lymphocytes Absolute: 2 10*3/uL (ref 0.7–3.1)
Lymphs: 22 %
MCH: 25.8 pg — ABNORMAL LOW (ref 26.6–33.0)
MCHC: 32 g/dL (ref 31.5–35.7)
MCV: 81 fL (ref 79–97)
Monocytes Absolute: 0.7 10*3/uL (ref 0.1–0.9)
Monocytes: 8 %
Neutrophils Absolute: 6.2 10*3/uL (ref 1.4–7.0)
Neutrophils: 69 %
Platelets: 305 10*3/uL (ref 150–450)
RBC: 4.76 x10E6/uL (ref 4.14–5.80)
RDW: 14 % (ref 11.6–15.4)
WBC: 9 10*3/uL (ref 3.4–10.8)

## 2022-11-19 LAB — LIPID PANEL
Chol/HDL Ratio: 2.6 ratio (ref 0.0–5.0)
Cholesterol, Total: 189 mg/dL (ref 100–199)
HDL: 72 mg/dL (ref >39)
LDL Chol Calc (NIH): 93 mg/dL (ref 0–99)
Triglycerides: 138 mg/dL (ref 0–149)
VLDL Cholesterol Cal: 24 mg/dL (ref 5–40)

## 2022-11-19 LAB — COMPREHENSIVE METABOLIC PANEL
ALT: 30 IU/L (ref 0–44)
AST: 27 IU/L (ref 0–40)
Albumin/Globulin Ratio: 1.7 (ref 1.2–2.2)
Albumin: 4.1 g/dL (ref 3.8–4.8)
Alkaline Phosphatase: 80 IU/L (ref 44–121)
BUN/Creatinine Ratio: 13 (ref 10–24)
BUN: 12 mg/dL (ref 8–27)
Bilirubin Total: 0.2 mg/dL (ref 0.0–1.2)
CO2: 28 mmol/L (ref 20–29)
Calcium: 9.6 mg/dL (ref 8.6–10.2)
Chloride: 98 mmol/L (ref 96–106)
Creatinine, Ser: 0.95 mg/dL (ref 0.76–1.27)
Globulin, Total: 2.4 g/dL (ref 1.5–4.5)
Glucose: 99 mg/dL (ref 70–99)
Potassium: 4.3 mmol/L (ref 3.5–5.2)
Sodium: 144 mmol/L (ref 134–144)
Total Protein: 6.5 g/dL (ref 6.0–8.5)
eGFR: 82 mL/min/{1.73_m2} (ref >59)

## 2022-11-19 LAB — HEMOGLOBIN A1C
Est. average glucose Bld gHb Est-mCnc: 134 mg/dL
Hgb A1c MFr Bld: 6.3 % — ABNORMAL HIGH (ref 4.8–5.6)

## 2022-11-19 LAB — CARDIOVASCULAR RISK ASSESSMENT

## 2022-11-19 MED ORDER — TRIAMCINOLONE ACETONIDE 40 MG/ML IJ SUSP
80.0000 mg | Freq: Once | INTRAMUSCULAR | Status: AC
  Administered 2022-11-19: 80 mg via INTRAMUSCULAR

## 2022-11-19 MED ORDER — PREDNISONE 20 MG PO TABS: ORAL_TABLET | ORAL | 0 refills | Status: DC

## 2022-11-19 MED ORDER — CEFTRIAXONE SODIUM 1 G IJ SOLR
1.0000 g | Freq: Once | INTRAMUSCULAR | Status: AC
  Administered 2022-11-19: 1 g via INTRAMUSCULAR

## 2022-11-19 NOTE — Patient Instructions (Signed)
Continue Bactrim DS until gone. Rocephin shot given today. Kenalog shot given today. Prednisone 20 mg 3 daily x3 days, then 2 daily x3 days, then 1 daily until seen by pulmonology in mid December.

## 2022-11-21 ENCOUNTER — Encounter: Payer: Self-pay | Admitting: Family Medicine

## 2022-11-21 DIAGNOSIS — I10 Essential (primary) hypertension: Secondary | ICD-10-CM | POA: Diagnosis not present

## 2022-11-21 DIAGNOSIS — I482 Chronic atrial fibrillation, unspecified: Secondary | ICD-10-CM

## 2022-11-21 DIAGNOSIS — I480 Paroxysmal atrial fibrillation: Secondary | ICD-10-CM | POA: Diagnosis not present

## 2022-11-21 DIAGNOSIS — J449 Chronic obstructive pulmonary disease, unspecified: Secondary | ICD-10-CM

## 2022-11-21 DIAGNOSIS — D0461 Carcinoma in situ of skin of right upper limb, including shoulder: Secondary | ICD-10-CM | POA: Diagnosis not present

## 2022-11-21 NOTE — Progress Notes (Unsigned)
Subjective:  Patient ID: Paul [name], male    DOB: [birth year]  Age: 76 y.o. MRN: [id number]  Chief Complaint  Patient presents with   Diabetes   Hypertension   Hyperlipidemia    HPI Diabetes:  Complications: none Glucose checking: no Glucose logs: no Hypoglycemia: no  Most recent A1C:6.3 Current medications:  none Last Eye Exam: summer 2022 Foot checks: daily  Hyperlipidemia: Current medications: on lipitor 40 mg once daily.   Hypertension/Atrial fibrillation: Current medications: bisoprolol 5 mg qd, diltiazem 60 mg one qid prn, Lasix 20 mg qd. On aspirin 81 mg daily.   COPD/Chronic respiratory failure with hypoxia: ON breztri 2 puffs bid, prednisone course started on Monday. Improved breathing. Patient is on Oxygen 2 -2.5 L at night and when ambulatory.   Iron deficiency anemia: questioning whether he needs to continue ferrous sulfate 325 mg twice daily.  GERD: on protonix 40 mg twice daily. GAD: on zoloft 50 mg once daily. Denies depression or anxiety. Has alprazolam twice daily as needed. Very sparingly.  Osteoporosis: on prolia every 6 months and vitamin d 2000 U daily  Diet: Fairly healthy. Exercise: walks  Current Outpatient Medications on File Prior to Visit  Medication Sig Dispense Refill   acetaminophen (TYLENOL) 500 MG tablet Take 1,000 mg by mouth at bedtime as needed (pain).     albuterol (PROAIR HFA) 108 (90 Base) MCG/ACT inhaler Inhale 2 puffs into the lungs as needed for wheezing or shortness of breath.     albuterol (PROVENTIL) (2.5 MG/3ML) 0.083% nebulizer solution Take 3 mLs (2.5 mg total) by nebulization every 6 (six) hours as needed for shortness of breath or wheezing. 360 mL 5   ALPRAZolam (XANAX) 0.5 MG tablet Take 1 tablet (0.5 mg total) by mouth 2 (two) times daily as needed for anxiety. 30 tablet 0   Ascorbic Acid (VITAMIN C) 1000 MG tablet Take 1,000 mg by mouth daily.     aspirin EC 81 MG tablet Take 1 tablet (81 mg total) by mouth daily.  Swallow whole. 90 tablet 3   atorvastatin (LIPITOR) 40 MG tablet TAKE 1 TABLET BY MOUTH EVERYDAY AT BEDTIME 90 tablet 0   Budeson-Glycopyrrol-Formoterol (BREZTRI AEROSPHERE) 160-9-4.8 MCG/ACT AERO Inhale 2 puffs into the lungs 2 (two) times daily. 32.1 g 3   calcium carbonate (OS-CAL) 600 MG TABS tablet Take 600 mg by mouth daily.     Cholecalciferol (VITAMIN D3) 50 MCG (2000 UT) TABS Take 2,000 Units by mouth in the morning.     denosumab (PROLIA) 60 MG/ML SOSY injection Inject 60 mg into the skin every 6 (six) months.     ferrous sulfate 325 (65 FE) MG tablet Take 325 mg by mouth 2 (two) times daily with a meal.     fluticasone (FLONASE) 50 MCG/ACT nasal spray Place 2 sprays into both nostrils daily as needed for allergies or rhinitis.     guaiFENesin (MUCINEX) 600 MG 12 hr tablet Take 2 tablets (1,200 mg total) by mouth 2 (two) times daily. (Patient taking differently: Take 1,200 mg by mouth 2 (two) times daily. Taking 1 tablet twice a day) 60 tablet 0   loratadine (CLARITIN) 10 MG tablet Take 10 mg by mouth in the morning.     Multiple Vitamin (MULTIVITAMIN WITH MINERALS) TABS tablet Take 1 tablet by mouth daily with breakfast.     OXYGEN Inhale 2 L/min into the lungs at bedtime.     pantoprazole (PROTONIX) 40 MG tablet Take 1 tablet (40 mg total) by  mouth 2 (two) times daily. 180 tablet 3   predniSONE (DELTASONE) 10 MG tablet Take 1 tablet (10 mg total) by mouth daily with breakfast. (Patient taking differently: Take 10 mg by mouth daily with breakfast. Take 2 tablets daily) 30 tablet 5   predniSONE (DELTASONE) 20 MG tablet Take 3 tablets (60 mg total) by mouth daily with breakfast for 3 days, THEN 2 tablets (40 mg total) daily with breakfast for 3 days, THEN 1 tablet (20 mg total) daily with breakfast for 24 days. 39 tablet 0   Probiotic Product (PROBIOTIC DAILY) CAPS Take 1 capsule by mouth in the morning.     sertraline (ZOLOFT) 50 MG tablet Take 1 tablet (50 mg total) by mouth at bedtime. 90  tablet 1   sulfamethoxazole-trimethoprim (BACTRIM DS) 800-160 MG tablet Take 1 tablet by mouth 2 (two) times daily.     No current facility-administered medications on file prior to visit.   Past Medical History:  Diagnosis Date   Anemia    Aortic atherosclerosis (HCC)    Aspiration pneumonia due to inhalation of vomitus ([place]) 02/03/2022   Atrial fibrillation (HCC)    Back pain    COPD (chronic obstructive pulmonary disease) (HCC)    COPD exacerbation ([place]) 12/27/2021   COVID-19 12/24/2020   Diabetes mellitus without complication (HCC)    Dizziness 04/08/2022   Elevated PSA    GAD (generalized anxiety disorder)    GERD (gastroesophageal reflux disease)    High cholesterol    Hypertension    Lingular pneumonia 01/05/2021   See cxr 01/06/20 rx with zpak/ cefipime x 5 days and then developed purulent sputum again 01/12/21 so rx levcaquin 500 mg daily x 7 days then return for cxr before more    Lung nodule < 6cm on CT 05/29/2016   Osteoporosis    Oxygen deficiency    Pneumonia    January 2022   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 Quentin Ore with a 20 mm Watchman FLX device   RSV bronchitis 01/27/2022   Vertebral compression fracture ([place]) 05/29/2019   T8 compression fracture noted on CT scan 05/28/2019 inpatient with chronic steroid dependent COPD - rx calcitonin nasal spray rx per PCP    Past Surgical History:  Procedure Laterality Date   CATARACT EXTRACTION Right    ELBOW SURGERY  07/2022   surgery on olecranon bursa. Dr. Greta Doom   IR KYPHO EA ADDL LEVEL THORACIC OR LUMBAR  11/28/2021   LEFT ATRIAL APPENDAGE OCCLUSION N/A 08/02/2021   Procedure: LEFT ATRIAL APPENDAGE OCCLUSION;  Surgeon: Sherren Mocha, MD;  Location: [facility];  Service: Cardiovascular;  Laterality: N/A;   SKIN SURGERY     shoulders and chest   TEE WITHOUT CARDIOVERSION N/A 08/02/2021   Procedure: TRANSESOPHAGEAL ECHOCARDIOGRAM (TEE);  Surgeon: Sherren Mocha, MD;  Location: [facility];  Service: Cardiovascular;  Laterality: N/A;   TEE WITHOUT CARDIOVERSION N/A 09/13/2021   Procedure: TRANSESOPHAGEAL ECHOCARDIOGRAM (TEE);  Surgeon: Sanda Klein, MD;  Location: [facility];  Service: Cardiovascular;  Laterality: N/A;    Family History  Problem Relation Age of Onset   Heart disease Brother    Heart disease Mother    Heart disease Father    Cancer Paternal Uncle    Social History   Socioeconomic History   Marital status: Married    Spouse name: Not on file   Number of children: 3   Years of education: Not on file  Highest education level: Not on file  Occupational History   Occupation: retired    Comment: truck driver  Tobacco Use   Smoking status: Former    Packs/day: 2.00    Years: 52.00    Total pack years: 104.00    Types: Cigarettes    Quit date: 02/03/2009    Years since quitting: 13.8   Smokeless tobacco: Never   Tobacco comments:    Counseled to remain smoke free  Vaping Use   Vaping Use: Never used  Substance and Sexual Activity   Alcohol use: Not Currently    Alcohol/week: 0.0 standard drinks of alcohol    Comment: occ   Drug use: No   Sexual activity: Not on file  Other Topics Concern   Not on file  Social History Narrative   Originally from [place]. Previously worked driving a Restaurant manager, fast food. No pets currently. No mold exposure. 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       wears sunscreen, brushes and flosses daily, see's dentist bi-annually, has smoke/carbon monoxide detectors, wears a seatbelt and practices gun safety      Social Determinants of Health   Financial Resource Strain: Medium Risk (10/31/2022)   Overall Financial Resource Strain (CARDIA)    Difficulty of Paying Living Expenses: Somewhat hard  Food Insecurity: No Food Insecurity (02/15/2022)   Hunger Vital Sign    Worried About Running Out of Food in the Last Year:  Never true    Ran Out of Food in the Last Year: Never true  Transportation Needs: No Transportation Needs (10/31/2022)   PRAPARE - Hydrologist (Medical): No    Lack of Transportation (Non-Medical): No  Physical Activity: Insufficiently Active (02/15/2022)   Exercise Vital Sign    Days of Exercise per Week: 7 days    Minutes of Exercise per Session: 10 min  Stress: No Stress Concern Present (02/15/2022)   [place]    Feeling of Stress : Only a little  Social Connections: Socially Integrated (02/15/2022)   Social Connection and Isolation Panel [NHANES]    Frequency of Communication with Friends and Family: More than three times a week    Frequency of Social Gatherings with Friends and Family: More than three times a week    Attends Religious Services: More than 4 times per year    Active Member of [employer] or Organizations: Yes    Attends Music therapist: More than 4 times per year    Marital Status: Married    Review of Systems  Constitutional:  Negative for chills and fever.  HENT:  Negative for congestion, rhinorrhea and sore throat.   Respiratory:  Positive for cough and shortness of breath.   Cardiovascular:  Negative for chest pain and palpitations.  Gastrointestinal:  Negative for abdominal pain, constipation, diarrhea, nausea and vomiting.  Genitourinary:  Negative for dysuria and urgency.  Musculoskeletal:  Negative for arthralgias, back pain and myalgias.  Neurological:  Negative for dizziness and headaches.  Psychiatric/Behavioral:  Negative for dysphoric mood. The patient is not nervous/anxious.      Objective:  BP 132/60   Pulse 74   Temp (!) 96.2 F (35.7 C)   Resp 18   Ht '5\' 6"'$  (1.676 m)   Wt 164 lb (74.4 kg)   BMI 26.47 kg/m      11/22/2022    9:31  AM 11/19/2022    9:53 AM 10/23/2022    8:39 AM  BP/Weight  Systolic BP 767 341 937  Diastolic BP 60 62  60  Wt. (Lbs) 164 164 161.6  BMI 26.47 kg/m2 26.47 kg/m2 26.08 kg/m2    Physical Exam Vitals reviewed.  Constitutional:      Appearance: Normal appearance.  Neck:     Vascular: No carotid bruit.  Cardiovascular:     Rate and Rhythm: Normal rate and regular rhythm.     Heart sounds: Normal heart sounds.  Pulmonary:     Effort: Pulmonary effort is normal.     Breath sounds: Normal breath sounds. No wheezing, rhonchi or rales.     Comments: Lungs greatly improved.  Abdominal:     General: Bowel sounds are normal.     Palpations: Abdomen is soft.     Tenderness: There is no abdominal tenderness.  Neurological:     Mental Status: He is alert and oriented to person, place, and time.  Psychiatric:        Mood and Affect: Mood normal.        Behavior: Behavior normal.     Diabetic Foot Exam - Simple   Simple Foot Form Diabetic Foot exam was performed with the following findings: Yes 11/22/2022 10:15 AM  Visual Inspection No deformities, no ulcerations, no other skin breakdown bilaterally: Yes Sensation Testing Intact to touch and monofilament testing bilaterally: Yes Pulse Check Posterior Tibialis and Dorsalis pulse intact bilaterally: Yes Comments      Lab Results  Component Value Date   WBC 9.0 11/18/2022   HGB 12.3 (L) 11/18/2022   HCT 38.4 11/18/2022   PLT 305 11/18/2022   GLUCOSE 99 11/18/2022   CHOL 189 11/18/2022   TRIG 138 11/18/2022   HDL 72 11/18/2022   LDLCALC 93 11/18/2022   ALT 30 11/18/2022   AST 27 11/18/2022   NA 144 11/18/2022   K 4.3 11/18/2022   CL 98 11/18/2022   CREATININE 0.95 11/18/2022   BUN 12 11/18/2022   CO2 28 11/18/2022   TSH 2.539 03/22/2019   INR 1.4 (H) 12/24/2020   HGBA1C 6.3 (H) 11/18/2022   MICROALBUR Negative 10/09/2021      Assessment & Plan:   Problem List Items Addressed This Visit       Cardiovascular and Mediastinum   Paroxysmal atrial fibrillation (HCC) - Primary (Chronic)    The current medical regimen is  effective;  continue present plan and medications. Continue diltiazem. Continue aspirin.       Hypertension associated with diabetes ([place])    The current medical regimen is effective;  continue present plan and medications. Continue bisoprolol 5 mg qd, diltiazem 60 mg one qid prn, Lasix 20 mg qd. On aspirin 81 mg daily.       Chronic diastolic CHF (congestive heart failure) (HCC)    Stable. No edema.        Respiratory   COPD exacerbation ([place])    Improving on acute treatment.      Chronic respiratory failure with hypoxia (HCC)    Continue oxygen 2 L as needed.        Digestive   GERD (gastroesophageal reflux disease) / GI protection on antiplatelet therapy (Chronic)    The current medical regimen is effective;  continue present plan and medications. Continue protonix 40 mg twice daily.         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 Recommend annual eye exams. Medicines: none Continue to work on eating a healthy diet and exercise.         Musculoskeletal and Integument   Age-related osteoporosis with healing pathological fracture, chronic back pain    Continue prolia 60 mg every 6 months.         Other   HLD (hyperlipidemia) (Chronic)    Well controlled.  No changes to medicines. Continue lipitor 40 mg before bed.  Continue to work on eating a healthy diet and exercise.  Labs drawn today.        GAD (generalized anxiety disorder)    Continue zoloft 50 mg once daily and xanax.       Iron 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    Continue iron sulfate.     .  No orders of the defined types were placed in this encounter.   No orders of the defined types were placed in this encounter.    Follow-up: Return in about 5 months (around 04/23/2023) for chronic fasting.  An After Visit Summary was printed and given to the patient.  [name], MD [facility] [phone]

## 2022-11-22 ENCOUNTER — Ambulatory Visit (INDEPENDENT_AMBULATORY_CARE_PROVIDER_SITE_OTHER): Payer: Medicare Other | Admitting: Family Medicine

## 2022-11-22 VITALS — BP 132/60 | HR 74 | Temp 96.2°F | Resp 18 | Ht 66.0 in | Wt 164.0 lb

## 2022-11-22 DIAGNOSIS — D508 Other iron deficiency anemias: Secondary | ICD-10-CM

## 2022-11-22 DIAGNOSIS — K219 Gastro-esophageal reflux disease without esophagitis: Secondary | ICD-10-CM

## 2022-11-22 DIAGNOSIS — E1159 Type 2 diabetes mellitus with other circulatory complications: Secondary | ICD-10-CM | POA: Diagnosis not present

## 2022-11-22 DIAGNOSIS — F411 Generalized anxiety disorder: Secondary | ICD-10-CM

## 2022-11-22 DIAGNOSIS — M8000XD Age-related osteoporosis with current pathological fracture, unspecified site, subsequent encounter for fracture with routine healing: Secondary | ICD-10-CM

## 2022-11-22 DIAGNOSIS — I5032 Chronic diastolic (congestive) heart failure: Secondary | ICD-10-CM | POA: Diagnosis not present

## 2022-11-22 DIAGNOSIS — J441 Chronic obstructive pulmonary disease with (acute) exacerbation: Secondary | ICD-10-CM | POA: Diagnosis not present

## 2022-11-22 DIAGNOSIS — E1169 Type 2 diabetes mellitus with other specified complication: Secondary | ICD-10-CM

## 2022-11-22 DIAGNOSIS — I152 Hypertension secondary to endocrine disorders: Secondary | ICD-10-CM

## 2022-11-22 DIAGNOSIS — I10 Essential (primary) hypertension: Secondary | ICD-10-CM

## 2022-11-22 DIAGNOSIS — J9611 Chronic respiratory failure with hypoxia: Secondary | ICD-10-CM

## 2022-11-22 DIAGNOSIS — I480 Paroxysmal atrial fibrillation: Secondary | ICD-10-CM

## 2022-11-22 DIAGNOSIS — E782 Mixed hyperlipidemia: Secondary | ICD-10-CM

## 2022-11-24 ENCOUNTER — Encounter: Payer: Self-pay | Admitting: Family Medicine

## 2022-11-24 NOTE — Assessment & Plan Note (Signed)
The current medical regimen is effective;  continue present plan and medications. Continue diltiazem. Continue aspirin.

## 2022-11-24 NOTE — Assessment & Plan Note (Signed)
Well controlled.  No changes to medicines. Continue lipitor 40 mg before bed.  Continue to work on eating a healthy diet and exercise.  Labs drawn today.

## 2022-11-24 NOTE — Assessment & Plan Note (Signed)
Stable. No edema.

## 2022-11-24 NOTE — Assessment & Plan Note (Signed)
The current medical regimen is effective;  continue present plan and medications. Continue bisoprolol 5 mg qd, diltiazem 60 mg one qid prn, Lasix 20 mg qd. On aspirin 81 mg daily.

## 2022-11-24 NOTE — Assessment & Plan Note (Signed)
Continue zoloft 50 mg once daily and xanax.

## 2022-11-24 NOTE — Assessment & Plan Note (Signed)
Improving on acute treatment.

## 2022-11-24 NOTE — Assessment & Plan Note (Signed)
Continue iron sulfate. 

## 2022-11-24 NOTE — Assessment & Plan Note (Signed)
Continue prolia 60 mg every 6 months.

## 2022-11-24 NOTE — Assessment & Plan Note (Signed)
Continue oxygen 2 L as needed.

## 2022-11-24 NOTE — Assessment & Plan Note (Addendum)
Continue Bactrim DS until gone. Rocephin shot given today. Kenalog shot given today. Prednisone 20 mg 3 daily x3 days, then 2 daily x3 days, then 1 daily until seen by pulmonology in mid December.

## 2022-11-24 NOTE — Assessment & Plan Note (Signed)
The current medical regimen is effective;  continue present plan and medications. Continue protonix 40 mg twice daily.

## 2022-11-24 NOTE — Assessment & Plan Note (Signed)
Control: good Recommend check feet daily. Recommend annual eye exams. Medicines: none Continue to work on eating a healthy diet and exercise.

## 2022-11-26 ENCOUNTER — Other Ambulatory Visit: Payer: Self-pay | Admitting: Physician Assistant

## 2022-12-05 ENCOUNTER — Encounter: Payer: Self-pay | Admitting: Adult Health

## 2022-12-05 ENCOUNTER — Ambulatory Visit (INDEPENDENT_AMBULATORY_CARE_PROVIDER_SITE_OTHER): Payer: Medicare Other | Admitting: Adult Health

## 2022-12-05 VITALS — BP 130/58 | HR 92 | Temp 98.2°F | Wt 162.0 lb

## 2022-12-05 DIAGNOSIS — J9611 Chronic respiratory failure with hypoxia: Secondary | ICD-10-CM

## 2022-12-05 DIAGNOSIS — Z23 Encounter for immunization: Secondary | ICD-10-CM

## 2022-12-05 DIAGNOSIS — I2510 Atherosclerotic heart disease of native coronary artery without angina pectoris: Secondary | ICD-10-CM | POA: Diagnosis not present

## 2022-12-05 DIAGNOSIS — J449 Chronic obstructive pulmonary disease, unspecified: Secondary | ICD-10-CM

## 2022-12-05 DIAGNOSIS — J441 Chronic obstructive pulmonary disease with (acute) exacerbation: Secondary | ICD-10-CM | POA: Diagnosis not present

## 2022-12-05 DIAGNOSIS — I2584 Coronary atherosclerosis due to calcified coronary lesion: Secondary | ICD-10-CM | POA: Diagnosis not present

## 2022-12-05 DIAGNOSIS — I5032 Chronic diastolic (congestive) heart failure: Secondary | ICD-10-CM

## 2022-12-05 NOTE — Patient Instructions (Addendum)
Continue on Prednisone '20mg'$  daily until 12/17, then decrease to '15mg'$  daily for 1 week then '10mg'$  daily  Continue on BREZTRI 2 puffs Twice daily, rinse after use.  Mucinex Twice daily  As needed  Cough/congestion  Flutter valve Three times a day  .  Albuterol Neb or inhaler as needed  Wear Oxygen 2l/m  with activity and At bedtime   to maintain O2 sats >88-90%.  Activity as tolerated.  Aspirations precautions as discussed.  Prevnar 20 vaccine today  Please contact office for sooner follow up if symptoms do not improve or worsen or seek emergency care  Follow up with Dr. Melvyn Novas  Or Baine Decesare NP in 2 months and As needed

## 2022-12-05 NOTE — Progress Notes (Signed)
$'[name]$  ID: Paul [name], male    DOB: 04-27-1946, 76 y.o.   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  Hospitalized February 2023 for rhinovirus with COPD exacerbation and acute on chronic respiratory failure Hospitalized March 2023 and July 2023 for COPD exacerbation  TEST/EVENTS :  COVID-19 infection January 2022 requiring hospitalization, hospitalization February 2023 for rhinovirus with COPD exacerbation and acute on chronic respiratory failure, hospitalization March 2023 for COPD exacerbation, hospitalization for COPD exacerbation July 2023   05/29/16: FVC 2.08 L (86%) FEV1 0.83 L (31%) FEV1/FVC 0.40 FEF 25-75 0.30 L (14%) no bronchodilator response TLC 6.56 L (107%) RV 167% ERV 160% DLCO corrected 60% (hemoglobin 10.9) 07/15/13: FVC 2.68 L (60%) FEV1 1.63 L (53%) FEV1/FVC 0.61 FEF 25-75 0.76 L (26%) 02/04/12: FVC 2.18 L (57%) FEV1 1.09 L (36%) FEV1/FVC 0.50 FEF 25-75 0.33 L (11%)     CT sinus February 2021 no acute sinus disease noted CT chest February 2021 emphysematous changes, several old thoracic compression fractures   2D echo July 2020 EF 60-65%, 2D echo September 13, 2021 EF 60-65%, Watchman device,, grade 3 moderate layered plaque involving the descending aorta   Palliative care started 2021 (nurse comes every 2 weeks)    March 2023 outpatient ABG without hypercarbia-does not qualify for nocturnal BiPAP or  NIV   12/05/2022 Follow [facility]:ERDE and O2 RF  Patient returns for a 6-week follow-up.  Patient has underlying severe COPD and oxygen dependent respiratory failure.  He is prone to recurrent exacerbations and hospitalizations.  He is on an aggressive maintenance regimen with Breztri twice daily, chronic steroids with prednisone 10 mg daily at baseline.  And is on oxygen 2 L with activity and at bedtime.  Patient says he did have a flare over things given when he was traveling to the beach.  He was seen by his primary care provider and given Bactrim and prednisone burst.  He is currently on 20 mg of prednisone.  Patient says he is feeling so much better.  Says he is remaining very active.  Goes to the gym.  And is doing light weight exercises.  He denies any increased oxygen demands.  Influenza, RSV and COVID booster are all up-to-date.  We discussed his Prevnar 20 booster today. Palliative care follows at home. Patient mains independent is able to drive.  Goes out to dinner travels some and goes to church.  Just celebrated his 36th wedding anniversary.  His family is coming home for the holidays.  Patient and his wife brought in Christmas treats for the staff today.    Getting Patient assistance for [facility].   Allergies  Allergen Reactions   Tape Other (See Comments)    PLEASE USE COBAN WRAP IN LIEU OF "TAPE," as it "pulls off the skin!!   Daliresp [Roflumilast] Diarrhea and Nausea And Vomiting   Levaquin [Levofloxacin] Palpitations and Other (See Comments)    Made the B/P fluctuate and heart raced   Alendronate Anxiety and Other (See Comments)    Jittery/nervous  Immunization History  Administered Date(s) Administered   COVID-19, mRNA, vaccine(Comirnaty)12 years and older 10/31/2022   Fluad Quad(high Dose 65+) 10/02/2020, 10/09/2021, 09/05/2022, 10/17/2022   Influenza Split 09/22/2013, 09/01/2015   Influenza Whole 09/04/2011, 09/21/2012   Influenza, High Dose Seasonal PF 09/02/2016,  09/10/2017, 08/31/2018, 09/24/2018, 10/21/2019   Influenza-Unspecified 08/23/2014   Moderna Covid-19 Vaccine Bivalent Booster 42yr & up 10/09/2021   Moderna SARS-COV2 Booster Vaccination 07/05/2021   Moderna Sars-Covid-2 Vaccination 01/17/2020, 02/14/2020, 10/06/2020   PNEUMOCOCCAL CONJUGATE-20 12/05/2022   Pneumococcal Conjugate-13 01/11/2014, 12/09/2016   Pneumococcal Polysaccharide-23 08/23/2010, 08/29/2011   Respiratory Syncytial Virus Vaccine,Recomb Aduvanted(Arexvy) 09/23/2022   Tdap 02/01/2019   Zoster Recombinat (Shingrix) 04/04/2014    Past Medical History:  Diagnosis Date   Anemia    Aortic atherosclerosis (HCC)    Aspiration pneumonia due to inhalation of vomitus ([facility] 02/03/2022   Atrial fibrillation (HCC)    Back pain    COPD (chronic obstructive pulmonary disease) ([facility]    COPD exacerbation ([facility] 12/27/2021   COVID-19 12/24/2020   Diabetes mellitus without complication (HCC)    Dizziness 04/08/2022   Elevated PSA    GAD (generalized anxiety disorder)    GERD (gastroesophageal reflux disease)    High cholesterol    Hypertension    Lingular pneumonia 01/05/2021   See cxr 01/06/20 rx with zpak/ cefipime x 5 days and then developed purulent sputum again 01/12/21 so rx levcaquin 500 mg daily x 7 days then return for cxr before more    Lung nodule < 6cm on CT 05/29/2016   Osteoporosis    Oxygen deficiency    Pneumonia    January 2022   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 LQuentin Orewith a 20 mm Watchman FLX device   RSV bronchitis 01/27/2022   Vertebral compression fracture ([facility] 05/29/2019   T8 compression fracture noted on CT scan 05/28/2019 inpatient with chronic steroid dependent COPD - rx calcitonin nasal spray rx per PCP     Tobacco History: Social History   Tobacco Use  Smoking Status Former   Packs/day: 2.00   Years: 52.00   Total pack years: 104.00   Types: Cigarettes   Quit date: 02/03/2009   Years  since quitting: 13.8  Smokeless Tobacco Never  Tobacco Comments   Counseled to remain smoke free   Counseling given: Not Answered Tobacco comments: Counseled to remain smoke free   Outpatient Medications Prior to Visit  Medication Sig Dispense Refill   acetaminophen (TYLENOL) 500 MG tablet Take 1,000 mg by mouth at bedtime as needed (pain).     albuterol (PROAIR HFA) 108 (90 Base) MCG/ACT inhaler Inhale 2 puffs into the lungs as needed for wheezing or shortness of breath.     albuterol (PROVENTIL) (2.5 MG/3ML) 0.083% nebulizer solution Take 3 mLs (2.5 mg total) by nebulization every 6 (six) hours as needed for shortness of breath or wheezing. 360 mL 5   ALPRAZolam (XANAX) 0.5 MG tablet Take 1 tablet (0.5 mg total) by mouth 2 (two) times daily as needed for anxiety. 30 tablet 0   Ascorbic Acid (VITAMIN C) 1000 MG tablet Take 1,000 mg by mouth daily.     aspirin EC 81 MG tablet Take 1 tablet (81 mg total) by mouth daily. Swallow whole. 90 tablet 3   atorvastatin (LIPITOR) 40 MG tablet TAKE 1 TABLET BY MOUTH EVERYDAY AT BEDTIME 90 tablet 0   Budeson-Glycopyrrol-Formoterol (BREZTRI AEROSPHERE) 160-9-4.8 MCG/ACT AERO Inhale 2 puffs into the lungs  2 (two) times daily. 32.1 g 3   calcium carbonate (OS-CAL) 600 MG TABS tablet Take 600 mg by mouth daily.     Cholecalciferol (VITAMIN D3) 50 MCG (2000 UT) TABS Take 2,000 Units by mouth in the morning.     denosumab (PROLIA) 60 MG/ML SOSY injection Inject 60 mg into the skin every 6 (six) months.     ferrous sulfate 325 (65 FE) MG tablet Take 325 mg by mouth 2 (two) times daily with a meal.     fluticasone (FLONASE) 50 MCG/ACT nasal spray Place 2 sprays into both nostrils daily as needed for allergies or rhinitis.     guaiFENesin (MUCINEX) 600 MG 12 hr tablet Take 2 tablets (1,200 mg total) by mouth 2 (two) times daily. (Patient taking differently: Take 1,200 mg by mouth 2 (two) times daily. Taking 1 tablet twice a day) 60 tablet 0   loratadine  (CLARITIN) 10 MG tablet Take 10 mg by mouth in the morning.     Multiple Vitamin (MULTIVITAMIN WITH MINERALS) TABS tablet Take 1 tablet by mouth daily with breakfast.     OXYGEN Inhale 2 L/min into the lungs at bedtime.     pantoprazole (PROTONIX) 40 MG tablet Take 1 tablet (40 mg total) by mouth 2 (two) times daily. 180 tablet 3   predniSONE (DELTASONE) 10 MG tablet Take 1 tablet (10 mg total) by mouth daily with breakfast. (Patient taking differently: Take 10 mg by mouth daily with breakfast. Take 2 tablets daily) 30 tablet 5   predniSONE (DELTASONE) 20 MG tablet Take 3 tablets (60 mg total) by mouth daily with breakfast for 3 days, THEN 2 tablets (40 mg total) daily with breakfast for 3 days, THEN 1 tablet (20 mg total) daily with breakfast for 24 days. (Patient taking differently: Take 3 tablets (60 mg total) by mouth daily with breakfast for 3 days, THEN 2 tablets (40 mg total) daily with breakfast for 3 days, THEN 1 tablet (20 mg total) daily with breakfast for 24 days.  On 20 mg currently (10 days) ) 39 tablet 0   Probiotic Product (PROBIOTIC DAILY) CAPS Take 1 capsule by mouth in the morning.     sertraline (ZOLOFT) 50 MG tablet Take 1 tablet (50 mg total) by mouth at bedtime. 90 tablet 1   sulfamethoxazole-trimethoprim (BACTRIM DS) 800-160 MG tablet Take 1 tablet by mouth 2 (two) times daily. (Patient not taking: Reported on 12/05/2022)     No facility-administered medications prior to visit.     Review of Systems:   Constitutional:   No  weight loss, night sweats,  Fevers, chills,  +fatigue, or  lassitude.  HEENT:   No headaches,  Difficulty swallowing,  Tooth/dental problems, or  Sore throat,                No sneezing, itching, ear ache, nasal congestion, post nasal drip,   CV:  No chest pain,  Orthopnea, PND, swelling in lower extremities, anasarca, dizziness, palpitations, syncope.   GI  No heartburn, indigestion, abdominal pain, nausea, vomiting, diarrhea, change in bowel  habits, loss of appetite, bloody stools.   Resp:   No chest wall deformity  Skin: no rash or lesions.  GU: no dysuria, change in color of urine, no urgency or frequency.  No flank pain, no hematuria   MS:  No joint pain or swelling.  No decreased range of motion.  No back pain.    Physical Exam  BP (!) 130/58 (BP Location: Left Arm,  Patient Position: Sitting, Cuff Size: Normal)   Pulse 92   Temp 98.2 F (36.8 C) (Oral)   Wt 162 lb (73.5 kg)   SpO2 93%   BMI 26.15 kg/m   GEN: A/Ox3; pleasant , NAD, elderly   HEENT:  [place]/AT,   NOSE-clear, THROAT-clear, no lesions, no postnasal drip or exudate noted.   NECK:  Supple w/ fair ROM; no JVD; normal carotid impulses w/o bruits; no thyromegaly or nodules palpated; no lymphadenopathy.    RESP diminished breath sounds in the bases no accessory muscle use, no dullness to percussion  CARD:  RRR, no m/r/g, tr  peripheral edema, pulses intact, no cyanosis or clubbing.  GI:   Soft & nt; nml bowel sounds; no organomegaly or masses detected.   Musco: Warm bil, no deformities or joint swelling noted.   Neuro: alert, no focal deficits noted.    Skin: Warm, no lesions or rashes    Lab Results:  CBC   BMET     ProBNP No results found for: "PROBNP"  Imaging: No results found.      Latest Ref Rng & Units 05/29/2016    9:15 AM  PFT Results  FVC-Pre L 1.92   FVC-Predicted Pre % 52   Pre FEV1/FVC % % 41   FEV1-Pre L 0.78   FEV1-Predicted Pre % 29     No results found for: "NITRICOXIDE"      Assessment & Plan:   COPD exacerbation (HCC) Recent COPD exacerbation now improving after empiric antibiotics and steroid burst.  Patient may slowly start to decrease his steroids back down to baseline at 10 mg.  Continue on aggressive maintenance regimen  Plan  Patient Instructions  Continue on Prednisone '20mg'$  daily until 12/17, then decrease to '15mg'$  daily for 1 week then '10mg'$  daily  Continue on BREZTRI 2 puffs Twice daily,  rinse after use.  Mucinex Twice daily  As needed  Cough/congestion  Flutter valve Three times a day  .  Albuterol Neb or inhaler as needed  Wear Oxygen 2l/m  with activity and At bedtime   to maintain O2 sats >88-90%.  Activity as tolerated.  Aspirations precautions as discussed.  Prevnar 20 vaccine today  Please contact office for sooner follow up if symptoms do not improve or worsen or seek emergency care  Follow up with Dr. Melvyn Novas  Or Kamee Bobst NP in 2 months and As needed     Chronic respiratory failure with hypoxia ([place]) Continue on oxygen with activity and at bedtime to maintain O2 saturations greater than 88 to 40%  Chronic diastolic CHF (congestive heart failure) ([place]) Appears compensated without evidence of volume overload on exam.  Continue on maintenance regimen     Rexene Edison, NP 12/05/2022

## 2022-12-05 NOTE — Assessment & Plan Note (Signed)
Recent COPD exacerbation now improving after empiric antibiotics and steroid burst.  Patient may slowly start to decrease his steroids back down to baseline at 10 mg.  Continue on aggressive maintenance regimen  Plan  Patient Instructions  Continue on Prednisone '20mg'$  daily until 12/17, then decrease to '15mg'$  daily for 1 week then '10mg'$  daily  Continue on BREZTRI 2 puffs Twice daily, rinse after use.  Mucinex Twice daily  As needed  Cough/congestion  Flutter valve Three times a day  .  Albuterol Neb or inhaler as needed  Wear Oxygen 2l/m  with activity and At bedtime   to maintain O2 sats >88-90%.  Activity as tolerated.  Aspirations precautions as discussed.  Prevnar 20 vaccine today  Please contact office for sooner follow up if symptoms do not improve or worsen or seek emergency care  Follow up with Dr. Melvyn Novas  Or Emelin Dascenzo NP in 2 months and As needed

## 2022-12-05 NOTE — Assessment & Plan Note (Signed)
Continue on oxygen with activity and at bedtime to maintain O2 saturations greater than 88 to 90%

## 2022-12-05 NOTE — Assessment & Plan Note (Signed)
Appears compensated without evidence of volume overload on exam.  Continue on maintenance regimen

## 2022-12-16 ENCOUNTER — Other Ambulatory Visit: Payer: Self-pay | Admitting: Family Medicine

## 2022-12-16 DIAGNOSIS — J441 Chronic obstructive pulmonary disease with (acute) exacerbation: Secondary | ICD-10-CM

## 2022-12-22 DIAGNOSIS — I10 Essential (primary) hypertension: Secondary | ICD-10-CM | POA: Diagnosis not present

## 2022-12-22 DIAGNOSIS — J449 Chronic obstructive pulmonary disease, unspecified: Secondary | ICD-10-CM | POA: Diagnosis not present

## 2022-12-22 DIAGNOSIS — I480 Paroxysmal atrial fibrillation: Secondary | ICD-10-CM | POA: Diagnosis not present

## 2022-12-24 DIAGNOSIS — J111 Influenza due to unidentified influenza virus with other respiratory manifestations: Secondary | ICD-10-CM | POA: Diagnosis not present

## 2022-12-24 DIAGNOSIS — J019 Acute sinusitis, unspecified: Secondary | ICD-10-CM | POA: Diagnosis not present

## 2022-12-24 DIAGNOSIS — J449 Chronic obstructive pulmonary disease, unspecified: Secondary | ICD-10-CM | POA: Diagnosis not present

## 2022-12-24 DIAGNOSIS — R051 Acute cough: Secondary | ICD-10-CM | POA: Diagnosis not present

## 2022-12-25 ENCOUNTER — Telehealth: Payer: Self-pay

## 2022-12-25 NOTE — Progress Notes (Signed)
Reason for Encounter: Hypertension  Contacted patient on 12/25/22 to discuss hypertension disease state.   Recent office visits:  11/22/22 [name] MD. Seen for routine visit. No med changes.  11/19/22 [name] MD. Seen for COPD. Prednisone 20 mg 3 daily x3 days, then 2 daily x3 days, then 1 daily until seen by pulmonology in mid December.   Recent consult visits:  12/05/22 (Pulmonary) Rexene Edison NP. Seen for COPD. Continue on Prednisone '20mg'$  daily until 12/17, then decrease to '15mg'$  daily for 1 week then '10mg'$  daily   Hospital visits:  None  Medications: Outpatient Encounter Medications as of 12/25/2022  Medication Sig   acetaminophen (TYLENOL) 500 MG tablet Take 1,000 mg by mouth at bedtime as needed (pain).   albuterol (PROAIR HFA) 108 (90 Base) MCG/ACT inhaler Inhale 2 puffs into the lungs as needed for wheezing or shortness of breath.   albuterol (PROVENTIL) (2.5 MG/3ML) 0.083% nebulizer solution Take 3 mLs (2.5 mg total) by nebulization every 6 (six) hours as needed for shortness of breath or wheezing.   ALPRAZolam (XANAX) 0.5 MG tablet Take 1 tablet (0.5 mg total) by mouth 2 (two) times daily as needed for anxiety.   Ascorbic Acid (VITAMIN C) 1000 MG tablet Take 1,000 mg by mouth daily.   aspirin EC 81 MG tablet Take 1 tablet (81 mg total) by mouth daily. Swallow whole.   atorvastatin (LIPITOR) 40 MG tablet TAKE 1 TABLET BY MOUTH EVERYDAY AT BEDTIME   Budeson-Glycopyrrol-Formoterol (BREZTRI AEROSPHERE) 160-9-4.8 MCG/ACT AERO Inhale 2 puffs into the lungs 2 (two) times daily.   calcium carbonate (OS-CAL) 600 MG TABS tablet Take 600 mg by mouth daily.   Cholecalciferol (VITAMIN D3) 50 MCG (2000 UT) TABS Take 2,000 Units by mouth in the morning.   denosumab (PROLIA) 60 MG/ML SOSY injection Inject 60 mg into the skin every 6 (six) months.   ferrous sulfate 325 (65 FE) MG tablet Take 325 mg by mouth 2 (two) times daily with a meal.   fluticasone (FLONASE) 50 MCG/ACT nasal spray  Place 2 sprays into both nostrils daily as needed for allergies or rhinitis.   guaiFENesin (MUCINEX) 600 MG 12 hr tablet Take 2 tablets (1,200 mg total) by mouth 2 (two) times daily. (Patient taking differently: Take 1,200 mg by mouth 2 (two) times daily. Taking 1 tablet twice a day)   loratadine (CLARITIN) 10 MG tablet Take 10 mg by mouth in the morning.   Multiple Vitamin (MULTIVITAMIN WITH MINERALS) TABS tablet Take 1 tablet by mouth daily with breakfast.   OXYGEN Inhale 2 L/min into the lungs at bedtime.   pantoprazole (PROTONIX) 40 MG tablet Take 1 tablet (40 mg total) by mouth 2 (two) times daily.   predniSONE (DELTASONE) 10 MG tablet Take 1 tablet (10 mg total) by mouth daily with breakfast. (Patient taking differently: Take 10 mg by mouth daily with breakfast. Take 2 tablets daily)   predniSONE (DELTASONE) 20 MG tablet TAKE 3 TABLETS (60 MG TOTAL) BY MOUTH DAILY WITH BREAKFAST FOR 3 DAYS, THEN 2 TABLETS (40 MG TOTAL) DAILY WITH BREAKFAST FOR 3 DAYS, THEN 1 TABLET (20 MG TOTAL) DAILY WITH BREAKFAST FOR 24 DAYS.   Probiotic Product (PROBIOTIC DAILY) CAPS Take 1 capsule by mouth in the morning.   sertraline (ZOLOFT) 50 MG tablet Take 1 tablet (50 mg total) by mouth at bedtime.   No facility-administered encounter medications on file as of 12/25/2022.    Recent Office Vitals: BP Readings from Last 3 Encounters:  12/05/22 Marland Kitchen)  130/58  11/22/22 132/60  11/19/22 (!) 168/62   Pulse Readings from Last 3 Encounters:  12/05/22 92  11/22/22 74  11/19/22 98    Wt Readings from Last 3 Encounters:  12/05/22 162 lb (73.5 kg)  11/22/22 164 lb (74.4 kg)  11/19/22 164 lb (74.4 kg)     Kidney Function Lab Results  Component Value Date/Time   CREATININE 0.95 11/18/2022 10:26 AM   CREATININE 1.02 08/12/2022 09:25 AM   GFRNONAA >60 07/20/2022 05:42 AM   GFRAA 107 11/20/2020 12:02 PM       Latest Ref Rng & Units 11/18/2022   10:26 AM 08/12/2022    9:25 AM 07/20/2022    5:42 AM  BMP  Glucose  70 - 99 mg/dL 99  89  116   BUN 8 - 27 mg/dL '12  14  24   '$ Creatinine 0.76 - 1.27 mg/dL 0.95  1.02  0.86   BUN/Creat Ratio 10 - '24 13  14    '$ Sodium 134 - 144 mmol/L 144  139  140   Potassium 3.5 - 5.2 mmol/L 4.3  4.5  4.6   Chloride 96 - 106 mmol/L 98  99  106   CO2 20 - 29 mmol/L '28  25  26   '$ Calcium 8.6 - 10.2 mg/dL 9.6  9.4  8.3      Current antihypertensive regimen:  Aspirin '81mg'$  daily Patient verbally confirms he is taking the above medications as directed. Yes  How often are you checking your Blood Pressure? daily  he checks his blood pressure in the morning before taking his medication.  Current home BP readings: 12/24/22 128/60 ( He did not have anymore readings. He has been sick with the flu)  Wrist or arm cuff:Arm  Caffeine intake:None  Salt intake:Limited  OTC medications including pseudoephedrine or NSAIDs? Tylenol prn   Any readings above 180/120? No  What recent interventions/DTPs have been made by any provider to improve Blood Pressure control since last CPP Visit: No changes   Any recent hospitalizations or ED visits since last visit with CPP? No  What diet changes have been made to improve Blood Pressure Control?  No changes   What exercise is being done to improve your Blood Pressure Control?  Pt goes to the gym 3 days a week   Adherence Review: Is the patient currently on ACE/ARB medication? No Does the patient have >5 day gap between last estimated fill dates? No  Star Rating Drugs:  Medication:  Last Fill: Day Supply None noted    Eulis Canner

## 2023-01-02 ENCOUNTER — Telehealth: Payer: Self-pay

## 2023-01-02 ENCOUNTER — Other Ambulatory Visit: Payer: Self-pay

## 2023-01-02 MED ORDER — BREZTRI AEROSPHERE 160-9-4.8 MCG/ACT IN AERO: 2.0000 | INHALATION_SPRAY | Freq: Two times a day (BID) | RESPIRATORY_TRACT | 3 refills | Status: DC

## 2023-01-02 NOTE — Progress Notes (Cosign Needed)
AZ&ME Notification:  Patient approved for 2024 Enrollment with [employer] Patient Assistance Program for [place], [place] will end on December 23, 2023.    [name], [place] Pharmacist Assistant [phone]

## 2023-01-06 ENCOUNTER — Ambulatory Visit (INDEPENDENT_AMBULATORY_CARE_PROVIDER_SITE_OTHER): Payer: Medicare Other | Admitting: Family Medicine

## 2023-01-06 VITALS — BP 130/76 | HR 90 | Temp 98.6°F | Resp 18 | Ht 66.0 in | Wt 162.0 lb

## 2023-01-06 DIAGNOSIS — J4489 Other specified chronic obstructive pulmonary disease: Secondary | ICD-10-CM

## 2023-01-06 DIAGNOSIS — J209 Acute bronchitis, unspecified: Secondary | ICD-10-CM | POA: Insufficient documentation

## 2023-01-06 LAB — POC COVID19 BINAXNOW

## 2023-01-06 MED ORDER — PREDNISONE 20 MG PO TABS: ORAL_TABLET | ORAL | 0 refills | Status: DC

## 2023-01-06 MED ORDER — TRIAMCINOLONE ACETONIDE 40 MG/ML IJ SUSP
80.0000 mg | Freq: Once | INTRAMUSCULAR | Status: AC
  Administered 2023-01-06: 80 mg via INTRAMUSCULAR

## 2023-01-06 MED ORDER — AMOXICILLIN-POT CLAVULANATE 875-125 MG PO TABS: 1.0000 | ORAL_TABLET | Freq: Two times a day (BID) | ORAL | 0 refills | Status: DC

## 2023-01-06 MED ORDER — CEFTRIAXONE SODIUM 1 G IJ SOLR
1.0000 g | Freq: Once | INTRAMUSCULAR | Status: AC
  Administered 2023-01-06: 1 g via INTRAMUSCULAR

## 2023-01-06 NOTE — Patient Instructions (Addendum)
Rocephin 1 gm shot given. Kenalog 80 mg shot given. Increase albuterol nebulizers to every 4-6 hours. Prednisone 20 mg 3 tablets daily for 3 days, then 2 tablets daily for 3 days, then 1 tablet daily for 3 days then return to 10 mg daily.  Augmentin 875 mg 1 twice daily for 10 days. Recommend increased oxygen to 3 L.

## 2023-01-06 NOTE — Assessment & Plan Note (Signed)
Rocephin 1 gm shot given. Kenalog 80 mg shot given. Increase albuterol nebulizers to every 4-6 hours. Prednisone 20 mg 3 tablets daily for 3 days, then 2 tablets daily for 3 days, then 1 tablet daily for 3 days then return to 10 mg daily.  Augmentin 875 mg 1 twice daily for 10 days. Recommend increased oxygen to 3 L.

## 2023-01-06 NOTE — Progress Notes (Signed)
Acute Office Visit  Subjective:    Patient ID: Paul [name], male    DOB: [birth year], 77 y.o.   MRN: [id number]  Chief Complaint  Patient presents with   Shortness of Breath    HPI: Patient is in today for increased shortness of breath, chest tightness, and cough x 2 day. Had flu one week ago. Given zpack and tamiflu.  Improved, but worsened again. Increased his prednisone to 20 mg daily per my instructions a couple of days ago. Using albuterol nebulizer 3 times a day.  Using chronic inhalers.  Using his oxygen at 3 L per nasal cannula continuous.   Past Medical History:  Diagnosis Date   Anemia    Aortic atherosclerosis (HCC)    Aspiration pneumonia due to inhalation of vomitus ([place]) 02/03/2022   Atrial fibrillation (HCC)    Back pain    COPD (chronic obstructive pulmonary disease) (HCC)    COPD exacerbation ([place]) 12/27/2021   COVID-19 12/24/2020   Diabetes mellitus without complication (HCC)    Dizziness 04/08/2022   Elevated PSA    GAD (generalized anxiety disorder)    GERD (gastroesophageal reflux disease)    High cholesterol    Hypertension    Lingular pneumonia 01/05/2021   See cxr 01/06/20 rx with zpak/ cefipime x 5 days and then developed purulent sputum again 01/12/21 so rx levcaquin 500 mg daily x 7 days then return for cxr before more    Lung nodule < 6cm on CT 05/29/2016   Osteoporosis    Oxygen deficiency    Pneumonia    January 2022   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 Quentin Ore with a 20 mm Watchman FLX device   RSV bronchitis 01/27/2022   Vertebral compression fracture ([place]) 05/29/2019   T8 compression fracture noted on CT scan 05/28/2019 inpatient with chronic steroid dependent COPD - rx calcitonin nasal spray rx per PCP     Past Surgical History:  Procedure Laterality Date   CATARACT EXTRACTION Right    ELBOW SURGERY  07/2022   surgery on olecranon bursa. Dr. Greta Doom   IR KYPHO EA ADDL LEVEL THORACIC  OR LUMBAR  11/28/2021   LEFT ATRIAL APPENDAGE OCCLUSION N/A 08/02/2021   Procedure: LEFT ATRIAL APPENDAGE OCCLUSION;  Surgeon: Sherren Mocha, MD;  Location: [facility];  Service: Cardiovascular;  Laterality: N/A;   SKIN SURGERY     shoulders and chest   TEE WITHOUT CARDIOVERSION N/A 08/02/2021   Procedure: TRANSESOPHAGEAL ECHOCARDIOGRAM (TEE);  Surgeon: Sherren Mocha, MD;  Location: [facility];  Service: Cardiovascular;  Laterality: N/A;   TEE WITHOUT CARDIOVERSION N/A 09/13/2021   Procedure: TRANSESOPHAGEAL ECHOCARDIOGRAM (TEE);  Surgeon: Sanda Klein, MD;  Location: [facility];  Service: Cardiovascular;  Laterality: N/A;    Family History  Problem Relation Age of Onset   Heart disease Brother    Heart disease Mother    Heart disease Father    Cancer Paternal Uncle     Social History   Socioeconomic History   Marital status: Married    Spouse name: Not on file   Number of children: 3   Years of education: Not on file   Highest education level: Not on file  Occupational History   Occupation: retired    Comment: truck driver  Tobacco Use   Smoking status: Former    Packs/day: 2.00    Years: 52.00    Total pack years: 104.00  Types: Cigarettes    Quit date: 02/03/2009    Years since quitting: 13.9   Smokeless tobacco: Never   Tobacco comments:    Counseled to remain smoke free  Vaping Use   Vaping Use: Never used  Substance and Sexual Activity   Alcohol use: Not Currently    Alcohol/week: 0.0 standard drinks of alcohol    Comment: occ   Drug use: No   Sexual activity: Not on file  Other Topics Concern   Not on file  Social History Narrative   Originally from [place]. Previously worked driving a Restaurant manager, fast food. No pets currently. No mold exposure. 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       wears sunscreen, brushes and flosses daily, see's dentist bi-annually, has smoke/carbon  monoxide detectors, wears a seatbelt and practices gun safety      Social Determinants of Health   Financial Resource Strain: Medium Risk (10/31/2022)   Overall Financial Resource Strain (CARDIA)    Difficulty of Paying Living Expenses: Somewhat hard  Food Insecurity: No Food Insecurity (02/15/2022)   Hunger Vital Sign    Worried About Running Out of Food in the Last Year: Never true    Ran Out of Food in the Last Year: Never true  Transportation Needs: No Transportation Needs (10/31/2022)   PRAPARE - Hydrologist (Medical): No    Lack of Transportation (Non-Medical): No  Physical Activity: Insufficiently Active (02/15/2022)   Exercise Vital Sign    Days of Exercise per Week: 7 days    Minutes of Exercise per Session: 10 min  Stress: No Stress Concern Present (02/15/2022)   [place]    Feeling of Stress : Only a little  Social Connections: Socially Integrated (02/15/2022)   Social Connection and Isolation Panel [NHANES]    Frequency of Communication with Friends and Family: More than three times a week    Frequency of Social Gatherings with Friends and Family: More than three times a week    Attends Religious Services: More than 4 times per year    Active Member of [employer] or Organizations: Yes    Attends Music therapist: More than 4 times per year    Marital Status: Married  Human resources officer Violence: Not At Risk (02/15/2022)   Humiliation, Afraid, Rape, and Kick questionnaire    Fear of Current or Ex-Partner: No    Emotionally Abused: No    Physically Abused: No    Sexually Abused: No    Outpatient Medications Prior to Visit  Medication Sig Dispense Refill   acetaminophen (TYLENOL) 500 MG tablet Take 1,000 mg by mouth at bedtime as needed (pain).     albuterol (PROAIR HFA) 108 (90 Base) MCG/ACT inhaler Inhale 2 puffs into the lungs as needed for wheezing or shortness of  breath.     albuterol (PROVENTIL) (2.5 MG/3ML) 0.083% nebulizer solution Take 3 mLs (2.5 mg total) by nebulization every 6 (six) hours as needed for shortness of breath or wheezing. 360 mL 5   ALPRAZolam (XANAX) 0.5 MG tablet Take 1 tablet (0.5 mg total) by mouth 2 (two) times daily as needed for anxiety. 30 tablet 0   Ascorbic Acid (VITAMIN C) 1000 MG tablet Take 1,000 mg by mouth daily.     aspirin EC 81 MG tablet Take 1 tablet (81 mg total) by mouth daily. Swallow whole. [place]  tablet 3   atorvastatin (LIPITOR) 40 MG tablet TAKE 1 TABLET BY MOUTH EVERYDAY AT BEDTIME 90 tablet 0   Budeson-Glycopyrrol-Formoterol (BREZTRI AEROSPHERE) 160-9-4.8 MCG/ACT AERO Inhale 2 puffs into the lungs 2 (two) times daily. 3 each 3   calcium carbonate (OS-CAL) 600 MG TABS tablet Take 600 mg by mouth daily.     Cholecalciferol (VITAMIN D3) 50 MCG (2000 UT) TABS Take 2,000 Units by mouth in the morning.     denosumab (PROLIA) 60 MG/ML SOSY injection Inject 60 mg into the skin every 6 (six) months.     ferrous sulfate 325 (65 FE) MG tablet Take 325 mg by mouth 2 (two) times daily with a meal.     fluticasone (FLONASE) 50 MCG/ACT nasal spray Place 2 sprays into both nostrils daily as needed for allergies or rhinitis.     guaiFENesin (MUCINEX) 600 MG 12 hr tablet Take 2 tablets (1,200 mg total) by mouth 2 (two) times daily. (Patient taking differently: Take 1,200 mg by mouth 2 (two) times daily. Taking 1 tablet twice a day) 60 tablet 0   loratadine (CLARITIN) 10 MG tablet Take 10 mg by mouth in the morning.     Multiple Vitamin (MULTIVITAMIN WITH MINERALS) TABS tablet Take 1 tablet by mouth daily with breakfast.     OXYGEN Inhale 2 L/min into the lungs at bedtime.     pantoprazole (PROTONIX) 40 MG tablet Take 1 tablet (40 mg total) by mouth 2 (two) times daily. 180 tablet 3   predniSONE (DELTASONE) 10 MG tablet Take 1 tablet (10 mg total) by mouth daily with breakfast. (Patient taking differently: Take 10 mg by mouth daily  with breakfast. Take 2 tablets daily) 30 tablet 5   Probiotic Product (PROBIOTIC DAILY) CAPS Take 1 capsule by mouth in the morning.     sertraline (ZOLOFT) 50 MG tablet Take 1 tablet (50 mg total) by mouth at bedtime. 90 tablet 1   predniSONE (DELTASONE) 20 MG tablet TAKE 3 TABLETS (60 MG TOTAL) BY MOUTH DAILY WITH BREAKFAST FOR 3 DAYS, THEN 2 TABLETS (40 MG TOTAL) DAILY WITH BREAKFAST FOR 3 DAYS, THEN 1 TABLET (20 MG TOTAL) DAILY WITH BREAKFAST FOR 24 DAYS. 39 tablet 0   No facility-administered medications prior to visit.    Allergies  Allergen Reactions   Tape Other (See Comments)    PLEASE USE COBAN WRAP IN LIEU OF "TAPE," as it "pulls off the skin!!   Daliresp [Roflumilast] Diarrhea and Nausea And Vomiting   Levaquin [Levofloxacin] Palpitations and Other (See Comments)    Made the B/P fluctuate and heart raced   Alendronate Anxiety and Other (See Comments)    Jittery/nervous    Review of Systems  Constitutional:  Positive for fatigue. Negative for chills, fever and unexpected weight change.  HENT:  Negative for congestion, ear pain and sore throat.   Respiratory:  Positive for cough, shortness of breath and wheezing.   Cardiovascular:  Negative for chest pain, palpitations and leg swelling.  Neurological:  Negative for dizziness and headaches.  Psychiatric/Behavioral:  Negative for dysphoric mood. The patient is not nervous/anxious.        Objective:    Physical Exam Constitutional:      Appearance: Normal appearance.  HENT:     Right Ear: There is impacted cerumen (partial. not occluded.).     Left Ear: Tympanic membrane, ear canal and external ear normal.     Nose: Nose normal. No congestion or rhinorrhea.     Mouth/Throat:  Mouth: Mucous membranes are moist.     Pharynx: No oropharyngeal exudate or posterior oropharyngeal erythema.  Cardiovascular:     Rate and Rhythm: Normal rate and regular rhythm.     Heart sounds: Normal heart sounds.  Pulmonary:      Effort: No respiratory distress.     Breath sounds: Wheezing present. No rhonchi.     Comments: Poor air exchange Abdominal:     General: Bowel sounds are normal.     Palpations: Abdomen is soft.     Tenderness: There is no abdominal tenderness.  Neurological:     Mental Status: He is alert.  Psychiatric:        Mood and Affect: Mood normal.        Behavior: Behavior normal.     BP 130/76   Pulse 90   Temp 98.6 F (37 C)   Resp 18   Ht '5\' 6"'$  (1.676 m)   Wt 162 lb (73.5 kg)   SpO2 92% Comment: on 3 L  BMI 26.15 kg/m  Wt Readings from Last 3 Encounters:  01/12/23 162 lb (73.5 kg)  12/05/22 162 lb (73.5 kg)  11/22/22 164 lb (74.4 kg)    Health Maintenance Due  Topic Date Due   OPHTHALMOLOGY EXAM  Never done   Hepatitis C Screening  Never done   Zoster Vaccines- Shingrix (2 of 2) 05/30/2014   Lung Cancer Screening  03/27/2021   COVID-19 Vaccine (6 - 2023-24 season) 12/26/2022   Medicare Annual Wellness (AWV)  02/15/2023    There are no preventive care reminders to display for this patient.   Lab Results  Component Value Date   TSH 2.539 03/22/2019   Lab Results  Component Value Date   WBC 9.0 11/18/2022   HGB 12.3 (L) 11/18/2022   HCT 38.4 11/18/2022   MCV 81 11/18/2022   PLT 305 11/18/2022   Lab Results  Component Value Date   NA 144 11/18/2022   K 4.3 11/18/2022   CO2 28 11/18/2022   GLUCOSE 99 11/18/2022   BUN 12 11/18/2022   CREATININE 0.95 11/18/2022   BILITOT 0.2 11/18/2022   ALKPHOS 80 11/18/2022   AST 27 11/18/2022   ALT 30 11/18/2022   PROT 6.5 11/18/2022   ALBUMIN 4.1 11/18/2022   CALCIUM 9.6 11/18/2022   ANIONGAP 8 07/20/2022   EGFR 82 11/18/2022   Lab Results  Component Value Date   CHOL 189 11/18/2022   Lab Results  Component Value Date   HDL 72 11/18/2022   Lab Results  Component Value Date   LDLCALC 93 11/18/2022   Lab Results  Component Value Date   TRIG 138 11/18/2022   Lab Results  Component Value Date   CHOLHDL  2.6 11/18/2022   Lab Results  Component Value Date   HGBA1C 6.3 (H) 11/18/2022       Assessment & Plan:  Paul [name] was seen today for shortness of breath.  Acute bronchitis with COPD ([place]) Assessment & Plan: Rocephin 1 gm shot given. Kenalog 80 mg shot given. Increase albuterol nebulizers to every 4-6 hours. Prednisone 20 mg 3 tablets daily for 3 days, then 2 tablets daily for 3 days, then 1 tablet daily for 3 days then return to 10 mg daily.  Augmentin 875 mg 1 twice daily for 10 days. Recommend increased oxygen to 3 L.  Orders: -     predniSONE; Take 3 tablets (60 mg total) by mouth daily with breakfast for 3 days, THEN 2 tablets (  40 mg total) daily with breakfast for 3 days, THEN 1 tablet (20 mg total) daily with breakfast for 3 days, THEN 1 tablet (20 mg total) daily with breakfast for 21 days.  Dispense: 42 tablet; Refill: 0 -     Amoxicillin-Pot Clavulanate; Take 1 tablet by mouth 2 (two) times daily.  Dispense: 20 tablet; Refill: 0 -     POC COVID-19 BinaxNow -     Triamcinolone Acetonide -     cefTRIAXone Sodium    Meds ordered this encounter  Medications   predniSONE (DELTASONE) 20 MG tablet    Sig: Take 3 tablets (60 mg total) by mouth daily with breakfast for 3 days, THEN 2 tablets (40 mg total) daily with breakfast for 3 days, THEN 1 tablet (20 mg total) daily with breakfast for 3 days, THEN 1 tablet (20 mg total) daily with breakfast for 21 days.    Dispense:  42 tablet    Refill:  0   amoxicillin-clavulanate (AUGMENTIN) 875-125 MG tablet    Sig: Take 1 tablet by mouth 2 (two) times daily.    Dispense:  20 tablet    Refill:  0   triamcinolone acetonide (KENALOG-40) injection 80 mg   cefTRIAXone (ROCEPHIN) injection 1 g    Orders Placed This Encounter  Procedures   POC COVID-19 BinaxNow     Follow-up: Return if symptoms worsen or fail to improve.  An After Visit Summary was printed and given to the patient.  [name], MD [facility] [phone]

## 2023-01-12 ENCOUNTER — Encounter: Payer: Self-pay | Admitting: Family Medicine

## 2023-01-13 ENCOUNTER — Telehealth: Payer: Self-pay

## 2023-01-13 NOTE — Telephone Encounter (Signed)
Patient wife called and stated that the patient is still wheezing, wanted to know does she needed to bring him back in? Please advise

## 2023-01-13 NOTE — Telephone Encounter (Signed)
Paul [name] is very concerned that Paul [name] continues to have audible wheezing.  Dr. Tobie Poet advised that he take his albuterol neb treatments every 3-4 hours.  Increase prednisone to 40 mg for the next two days and call pulmonary for further instructions.  He has been wearing his oxygen and his O2 sat have ranged from 88-99%.

## 2023-01-15 ENCOUNTER — Telehealth: Payer: Self-pay | Admitting: Adult Health

## 2023-01-15 NOTE — Telephone Encounter (Signed)
PT wife calling. States he SOB, O2 levels drop on low activity. Using a round of Pred but not helping. Wife concerned. No appts avail w/Tammy or Dr. Melvyn Novas. Pls call to advise.[facility] said  on Jan 3 he had Type B flu.  Wife @ [phone] (This is HER #)

## 2023-01-15 NOTE — Telephone Encounter (Signed)
Called and spoke with pt's spouse who states that pt has been on multiple rounds of abx and is still not getting any better. Pt has had complaints of increased SOB, wheezing, coughing. Scheduled pt an appt with Dr. Valeta Harms tomorrow 1/25 at 4pm. Nothing further needed.

## 2023-01-16 ENCOUNTER — Encounter (HOSPITAL_BASED_OUTPATIENT_CLINIC_OR_DEPARTMENT_OTHER): Payer: Self-pay | Admitting: Emergency Medicine

## 2023-01-16 ENCOUNTER — Encounter (HOSPITAL_COMMUNITY): Payer: Self-pay

## 2023-01-16 ENCOUNTER — Other Ambulatory Visit: Payer: Self-pay

## 2023-01-16 ENCOUNTER — Ambulatory Visit: Payer: Medicare Other | Admitting: Pulmonary Disease

## 2023-01-16 ENCOUNTER — Emergency Department (HOSPITAL_BASED_OUTPATIENT_CLINIC_OR_DEPARTMENT_OTHER): Payer: Medicare Other

## 2023-01-16 ENCOUNTER — Inpatient Hospital Stay (HOSPITAL_BASED_OUTPATIENT_CLINIC_OR_DEPARTMENT_OTHER)
Admission: EM | Admit: 2023-01-16 | Discharge: 2023-01-19 | DRG: 190 | Disposition: A | Discharge status: Home / Self Care | Payer: Medicare Other | Attending: Internal Medicine | Admitting: Internal Medicine

## 2023-01-16 DIAGNOSIS — I2510 Atherosclerotic heart disease of native coronary artery without angina pectoris: Secondary | ICD-10-CM | POA: Diagnosis present

## 2023-01-16 DIAGNOSIS — M810 Age-related osteoporosis without current pathological fracture: Secondary | ICD-10-CM | POA: Diagnosis present

## 2023-01-16 DIAGNOSIS — I700 Atherosclerosis of aorta: Secondary | ICD-10-CM | POA: Diagnosis present

## 2023-01-16 DIAGNOSIS — Z7982 Long term (current) use of aspirin: Secondary | ICD-10-CM

## 2023-01-16 DIAGNOSIS — Z9981 Dependence on supplemental oxygen: Secondary | ICD-10-CM | POA: Diagnosis not present

## 2023-01-16 DIAGNOSIS — R0602 Shortness of breath: Secondary | ICD-10-CM | POA: Diagnosis not present

## 2023-01-16 DIAGNOSIS — E1169 Type 2 diabetes mellitus with other specified complication: Secondary | ICD-10-CM | POA: Diagnosis not present

## 2023-01-16 DIAGNOSIS — Z91048 Other nonmedicinal substance allergy status: Secondary | ICD-10-CM

## 2023-01-16 DIAGNOSIS — Z1152 Encounter for screening for COVID-19: Secondary | ICD-10-CM | POA: Diagnosis not present

## 2023-01-16 DIAGNOSIS — K219 Gastro-esophageal reflux disease without esophagitis: Secondary | ICD-10-CM | POA: Diagnosis present

## 2023-01-16 DIAGNOSIS — Z87891 Personal history of nicotine dependence: Secondary | ICD-10-CM | POA: Diagnosis not present

## 2023-01-16 DIAGNOSIS — Z95818 Presence of other cardiac implants and grafts: Secondary | ICD-10-CM

## 2023-01-16 DIAGNOSIS — J9601 Acute respiratory failure with hypoxia: Secondary | ICD-10-CM | POA: Diagnosis not present

## 2023-01-16 DIAGNOSIS — Z79899 Other long term (current) drug therapy: Secondary | ICD-10-CM

## 2023-01-16 DIAGNOSIS — Z7952 Long term (current) use of systemic steroids: Secondary | ICD-10-CM | POA: Diagnosis not present

## 2023-01-16 DIAGNOSIS — E782 Mixed hyperlipidemia: Secondary | ICD-10-CM | POA: Diagnosis present

## 2023-01-16 DIAGNOSIS — Z8616 Personal history of COVID-19: Secondary | ICD-10-CM

## 2023-01-16 DIAGNOSIS — Z8249 Family history of ischemic heart disease and other diseases of the circulatory system: Secondary | ICD-10-CM

## 2023-01-16 DIAGNOSIS — Z9841 Cataract extraction status, right eye: Secondary | ICD-10-CM

## 2023-01-16 DIAGNOSIS — F411 Generalized anxiety disorder: Secondary | ICD-10-CM | POA: Diagnosis present

## 2023-01-16 DIAGNOSIS — I5032 Chronic diastolic (congestive) heart failure: Secondary | ICD-10-CM | POA: Diagnosis present

## 2023-01-16 DIAGNOSIS — I110 Hypertensive heart disease with heart failure: Secondary | ICD-10-CM | POA: Diagnosis present

## 2023-01-16 DIAGNOSIS — J439 Emphysema, unspecified: Secondary | ICD-10-CM | POA: Diagnosis not present

## 2023-01-16 DIAGNOSIS — J441 Chronic obstructive pulmonary disease with (acute) exacerbation: Principal | ICD-10-CM | POA: Diagnosis present

## 2023-01-16 DIAGNOSIS — Z888 Allergy status to other drugs, medicaments and biological substances status: Secondary | ICD-10-CM

## 2023-01-16 DIAGNOSIS — D649 Anemia, unspecified: Secondary | ICD-10-CM | POA: Diagnosis present

## 2023-01-16 DIAGNOSIS — J9621 Acute and chronic respiratory failure with hypoxia: Secondary | ICD-10-CM | POA: Diagnosis present

## 2023-01-16 DIAGNOSIS — I480 Paroxysmal atrial fibrillation: Secondary | ICD-10-CM | POA: Diagnosis not present

## 2023-01-16 DIAGNOSIS — J9600 Acute respiratory failure, unspecified whether with hypoxia or hypercapnia: Secondary | ICD-10-CM | POA: Diagnosis present

## 2023-01-16 DIAGNOSIS — E785 Hyperlipidemia, unspecified: Secondary | ICD-10-CM | POA: Diagnosis present

## 2023-01-16 LAB — CBC WITH DIFFERENTIAL/PLATELET
Abs Immature Granulocytes: 0.11 10*3/uL — ABNORMAL HIGH (ref 0.00–0.07)
Basophils Absolute: 0 10*3/uL (ref 0.0–0.1)
Basophils Relative: 0 %
Eosinophils Absolute: 0.1 10*3/uL (ref 0.0–0.5)
Eosinophils Relative: 0 %
HCT: 42.6 % (ref 39.0–52.0)
Hemoglobin: 13.1 g/dL (ref 13.0–17.0)
Immature Granulocytes: 1 %
Lymphocytes Relative: 4 %
Lymphs Abs: 0.7 10*3/uL (ref 0.7–4.0)
MCH: 26 pg (ref 26.0–34.0)
MCHC: 30.8 g/dL (ref 30.0–36.0)
MCV: 84.7 fL (ref 80.0–100.0)
Monocytes Absolute: 0.8 10*3/uL (ref 0.1–1.0)
Monocytes Relative: 5 %
Neutro Abs: 14.8 10*3/uL — ABNORMAL HIGH (ref 1.7–7.7)
Neutrophils Relative %: 90 %
Platelets: 274 10*3/uL (ref 150–400)
RBC: 5.03 MIL/uL (ref 4.22–5.81)
RDW: 15 % (ref 11.5–15.5)
WBC: 16.4 10*3/uL — ABNORMAL HIGH (ref 4.0–10.5)
nRBC: 0 % (ref 0.0–0.2)

## 2023-01-16 LAB — BASIC METABOLIC PANEL
Anion gap: 8 (ref 5–15)
BUN: 18 mg/dL (ref 8–23)
CO2: 36 mmol/L — ABNORMAL HIGH (ref 22–32)
Calcium: 9.6 mg/dL (ref 8.9–10.3)
Chloride: 96 mmol/L — ABNORMAL LOW (ref 98–111)
Creatinine, Ser: 0.77 mg/dL (ref 0.61–1.24)
GFR, Estimated: >60 mL/min (ref >60)
Glucose, Bld: 124 mg/dL — ABNORMAL HIGH (ref 70–99)
Potassium: 4.4 mmol/L (ref 3.5–5.1)
Sodium: 140 mmol/L (ref 135–145)

## 2023-01-16 LAB — RESP PANEL BY RT-PCR (RSV, FLU A&B, COVID)  RVPGX2
Influenza A by PCR: NEGATIVE
Influenza B by PCR: NEGATIVE
Resp Syncytial Virus by PCR: NEGATIVE
SARS Coronavirus 2 by RT PCR: NEGATIVE

## 2023-01-16 LAB — BRAIN NATRIURETIC PEPTIDE: B Natriuretic Peptide: 66.6 pg/mL (ref 0.0–100.0)

## 2023-01-16 MED ORDER — ASPIRIN 81 MG PO TBEC
81.0000 mg | DELAYED_RELEASE_TABLET | Freq: Every day | ORAL | Status: DC
  Administered 2023-01-17 – 2023-01-19 (×3): 81 mg via ORAL
  Filled 2023-01-16 (×3): qty 1

## 2023-01-16 MED ORDER — ENOXAPARIN SODIUM 40 MG/0.4ML IJ SOSY
40.0000 mg | PREFILLED_SYRINGE | INTRAMUSCULAR | Status: DC
  Administered 2023-01-17 – 2023-01-19 (×3): 40 mg via SUBCUTANEOUS
  Filled 2023-01-16 (×3): qty 0.4

## 2023-01-16 MED ORDER — ALBUTEROL SULFATE (2.5 MG/3ML) 0.083% IN NEBU
2.5000 mg | INHALATION_SOLUTION | RESPIRATORY_TRACT | Status: DC | PRN
  Administered 2023-01-18: 2.5 mg via RESPIRATORY_TRACT

## 2023-01-16 MED ORDER — IPRATROPIUM-ALBUTEROL 0.5-2.5 (3) MG/3ML IN SOLN: 3.0000 mL | Freq: Once | RESPIRATORY_TRACT | Status: AC

## 2023-01-16 MED ORDER — IPRATROPIUM-ALBUTEROL 0.5-2.5 (3) MG/3ML IN SOLN: 3.0000 mL | Freq: Four times a day (QID) | RESPIRATORY_TRACT | Status: DC

## 2023-01-16 MED ORDER — SODIUM CHLORIDE 0.9 % IV SOLN
500.0000 mg | INTRAVENOUS | Status: AC
  Administered 2023-01-16: 500 mg via INTRAVENOUS
  Filled 2023-01-16: qty 5

## 2023-01-16 MED ORDER — ALBUTEROL SULFATE (2.5 MG/3ML) 0.083% IN NEBU
INHALATION_SOLUTION | RESPIRATORY_TRACT | Status: AC
  Administered 2023-01-16: 7.5 mg
  Filled 2023-01-16: qty 9

## 2023-01-16 MED ORDER — IPRATROPIUM-ALBUTEROL 0.5-2.5 (3) MG/3ML IN SOLN: 3.0000 mL | RESPIRATORY_TRACT | Status: DC | PRN

## 2023-01-16 MED ORDER — ALPRAZOLAM 0.5 MG PO TABS: 0.5000 mg | ORAL_TABLET | Freq: Two times a day (BID) | ORAL | Status: DC | PRN

## 2023-01-16 MED ORDER — IPRATROPIUM-ALBUTEROL 0.5-2.5 (3) MG/3ML IN SOLN
RESPIRATORY_TRACT | Status: AC
  Administered 2023-01-16: 3 mL via RESPIRATORY_TRACT
  Filled 2023-01-16: qty 3

## 2023-01-16 MED ORDER — ALBUTEROL SULFATE HFA 108 (90 BASE) MCG/ACT IN AERS: 2.0000 | INHALATION_SPRAY | RESPIRATORY_TRACT | Status: DC | PRN

## 2023-01-16 MED ORDER — PREDNISONE 20 MG PO TABS: 40.0000 mg | ORAL_TABLET | Freq: Every day | ORAL | Status: DC

## 2023-01-16 MED ORDER — RISAQUAD PO CAPS
1.0000 | ORAL_CAPSULE | Freq: Every morning | ORAL | Status: DC
  Administered 2023-01-17 – 2023-01-19 (×3): 1 via ORAL
  Filled 2023-01-16 (×3): qty 1

## 2023-01-16 MED ORDER — ATORVASTATIN CALCIUM 40 MG PO TABS
40.0000 mg | ORAL_TABLET | Freq: Every day | ORAL | Status: DC
  Administered 2023-01-17 – 2023-01-19 (×3): 40 mg via ORAL
  Filled 2023-01-16 (×3): qty 1

## 2023-01-16 MED ORDER — METHYLPREDNISOLONE SODIUM SUCC 40 MG IJ SOLR
40.0000 mg | Freq: Two times a day (BID) | INTRAMUSCULAR | Status: DC
  Administered 2023-01-16: 40 mg via INTRAVENOUS
  Filled 2023-01-16: qty 1

## 2023-01-16 MED ORDER — METHYLPREDNISOLONE SODIUM SUCC 125 MG IJ SOLR
125.0000 mg | Freq: Once | INTRAMUSCULAR | Status: AC
  Administered 2023-01-16: 125 mg via INTRAVENOUS
  Filled 2023-01-16: qty 2

## 2023-01-16 MED ORDER — ALBUTEROL (5 MG/ML) CONTINUOUS INHALATION SOLN
7.5000 mg/h | INHALATION_SOLUTION | Freq: Once | RESPIRATORY_TRACT | Status: AC
  Administered 2023-01-16: 7.5 mg/h via RESPIRATORY_TRACT
  Filled 2023-01-16: qty 20

## 2023-01-16 MED ORDER — AZITHROMYCIN 250 MG PO TABS
500.0000 mg | ORAL_TABLET | Freq: Every day | ORAL | Status: DC
  Administered 2023-01-17 – 2023-01-19 (×3): 500 mg via ORAL
  Filled 2023-01-16 (×3): qty 2

## 2023-01-16 MED ORDER — SERTRALINE HCL 50 MG PO TABS
50.0000 mg | ORAL_TABLET | Freq: Every day | ORAL | Status: DC
  Administered 2023-01-16 – 2023-01-18 (×3): 50 mg via ORAL
  Filled 2023-01-16 (×3): qty 1

## 2023-01-16 MED ORDER — IPRATROPIUM-ALBUTEROL 0.5-2.5 (3) MG/3ML IN SOLN
3.0000 mL | Freq: Four times a day (QID) | RESPIRATORY_TRACT | Status: DC
  Administered 2023-01-17: 3 mL via RESPIRATORY_TRACT
  Filled 2023-01-16: qty 3

## 2023-01-16 MED ORDER — MAGNESIUM SULFATE 50 % IJ SOLN
1.0000 g | Freq: Once | INTRAMUSCULAR | Status: AC
  Administered 2023-01-16: 1 g via INTRAVENOUS
  Filled 2023-01-16: qty 2

## 2023-01-16 MED ORDER — GUAIFENESIN ER 600 MG PO TB12
600.0000 mg | ORAL_TABLET | Freq: Two times a day (BID) | ORAL | Status: DC
  Administered 2023-01-16 – 2023-01-19 (×6): 600 mg via ORAL
  Filled 2023-01-16 (×6): qty 1

## 2023-01-16 NOTE — ED Triage Notes (Signed)
Pt has history of COPD, pt states his breathing has been getting worse since the beginning of January after getting the flu.  Pt saw PCP 2 weeks ago and was given steroids and continues to have problems breathing.  Last nebulizer treatment was at 0900.

## 2023-01-16 NOTE — Progress Notes (Signed)
BiPAP taken off at this time per request. Placed back on [facility]. RT to monitor as needed

## 2023-01-16 NOTE — ED Provider Notes (Signed)
[facility] [place] Provider Note   CSN: [id number] Arrival date & time: 01/16/23  1054     History  Chief Complaint  Patient presents with   Shortness of Breath    ASHAZ ROBLING is a 77 y.o. male.  The history is provided by the patient, medical records and the spouse. No language interpreter was used.  Shortness of Breath    77 year old male with significant history of COPD on home O2 at 2 L, history of diabetes, paroxysmal atrial fibrillation, CAD, CHF presenting complaining of shortness of breath.  Patient reports he tested positive for influenza B approximately 3 weeks ago.  Since then he has had waxing waning episodes of increased shortness of breath.  States he has been through 2 rounds of antibiotics and multiple rounds of prednisone.  For the past few days he endorsed worsening shortness of breath with any simple exertion.  He felt his chest is tight and increasing wheezing.  Denies fever chills body aches worsening productive cough.  He is using his nebulizer 4 times daily, and rescue inhaler as needed.  He is on 2 L of oxygen throughout the day.  He denies any fluid retention.  He denies any hemoptysis.  Patient has a Forensic scientist.  He is not on any blood thinner medication.  Home Medications Prior to Admission medications   Medication Sig Start Date End Date Taking? Authorizing Provider  acetaminophen (TYLENOL) 500 MG tablet Take 1,000 mg by mouth at bedtime as needed (pain).    [provider]  albuterol (PROAIR HFA) 108 (90 Base) MCG/ACT inhaler Inhale 2 puffs into the lungs as needed for wheezing or shortness of breath.    [provider]  albuterol (PROVENTIL) (2.5 MG/3ML) 0.083% nebulizer solution Take 3 mLs (2.5 mg total) by nebulization every 6 (six) hours as needed for shortness of breath or wheezing. 10/23/22   Parrett, Fonnie Mu, NP  ALPRAZolam (XANAX) 0.5 MG tablet Take 1 tablet (0.5 mg total) by mouth 2 (two) times  daily as needed for anxiety. 07/25/22   [name], MD  amoxicillin-clavulanate (AUGMENTIN) 875-125 MG tablet Take 1 tablet by mouth 2 (two) times daily. 01/06/23   CoxElnita Maxwell, MD  Ascorbic Acid (VITAMIN C) 1000 MG tablet Take 1,000 mg by mouth daily.    [provider]  aspirin EC 81 MG tablet Take 1 tablet (81 mg total) by mouth daily. Swallow whole. 01/28/22   Kathyrn Drown D, NP  atorvastatin (LIPITOR) 40 MG tablet TAKE 1 TABLET BY MOUTH EVERYDAY AT BEDTIME 11/26/22   Marge Duncans, PA-C  Budeson-Glycopyrrol-Formoterol (BREZTRI AEROSPHERE) 160-9-4.8 MCG/ACT AERO Inhale 2 puffs into the lungs 2 (two) times daily. 01/02/23   Cox, Elnita Maxwell, MD  calcium carbonate (OS-CAL) 600 MG TABS tablet Take 600 mg by mouth daily.    [provider]  Cholecalciferol (VITAMIN D3) 50 MCG (2000 UT) TABS Take 2,000 Units by mouth in the morning.    [provider]  denosumab (PROLIA) 60 MG/ML SOSY injection Inject 60 mg into the skin every 6 (six) months.    [provider]  ferrous sulfate 325 (65 FE) MG tablet Take 325 mg by mouth 2 (two) times daily with a meal.    [provider]  fluticasone (FLONASE) 50 MCG/ACT nasal spray Place 2 sprays into both nostrils daily as needed for allergies or rhinitis.    [provider]  guaiFENesin (MUCINEX) 600 MG 12 hr tablet Take 2 tablets (1,200 mg  total) by mouth 2 (two) times daily. Patient taking differently: Take 1,200 mg by mouth 2 (two) times daily. Taking 1 tablet twice a day 07/21/22   Regalado, Belkys A, MD  loratadine (CLARITIN) 10 MG tablet Take 10 mg by mouth in the morning.    [provider]  Multiple Vitamin (MULTIVITAMIN WITH MINERALS) TABS tablet Take 1 tablet by mouth daily with breakfast.    [provider]  OXYGEN Inhale 2 L/min into the lungs at bedtime.    [provider]  pantoprazole (PROTONIX) 40 MG tablet Take 1 tablet (40 mg total) by mouth 2 (two) times daily. 11/06/22    Tanda Rockers, MD  predniSONE (DELTASONE) 10 MG tablet Take 1 tablet (10 mg total) by mouth daily with breakfast. Patient taking differently: Take 10 mg by mouth daily with breakfast. Take 2 tablets daily 09/04/22   Parrett, Fonnie Mu, NP  predniSONE (DELTASONE) 20 MG tablet Take 3 tablets (60 mg total) by mouth daily with breakfast for 3 days, THEN 2 tablets (40 mg total) daily with breakfast for 3 days, THEN 1 tablet (20 mg total) daily with breakfast for 3 days, THEN 1 tablet (20 mg total) daily with breakfast for 21 days. 01/06/23 02/05/23  [name], MD  Probiotic Product (PROBIOTIC DAILY) CAPS Take 1 capsule by mouth in the morning.    [provider]  sertraline (ZOLOFT) 50 MG tablet Take 1 tablet (50 mg total) by mouth at bedtime. 09/16/22   [name], MD      Allergies    Tape, Daliresp [roflumilast], Levaquin [levofloxacin], and Alendronate    Review of Systems   Review of Systems  Respiratory:  Positive for shortness of breath.   All other systems reviewed and are negative.   Physical Exam Updated Vital Signs BP (!) 162/73 (BP Location: Right Arm)   Pulse (!) 104   Temp 98 F (36.7 C) (Oral)   Resp (!) 24   Ht '5\' 6"'$  (1.676 m)   Wt 73.5 kg   SpO2 99%   BMI 26.15 kg/m  Physical Exam Vitals and nursing note reviewed.  Constitutional:      Appearance: He is well-developed.     Comments: Wearing supplemental oxygen.  HENT:     Head: Atraumatic.  Eyes:     Conjunctiva/sclera: Conjunctivae normal.  Cardiovascular:     Rate and Rhythm: Tachycardia present. Rhythm irregular.  Pulmonary:     Effort: Tachypnea present. No respiratory distress.     Breath sounds: No stridor. Decreased breath sounds present. No rhonchi or rales.  Chest:     Chest wall: No tenderness.  Abdominal:     Comments: Protuberant abdomen nontender to palpation.  Musculoskeletal:     Cervical back: Neck supple.     Right lower leg: No edema.     Left lower leg: No edema.  Skin:     Findings: No rash.  Neurological:     Mental Status: He is alert and oriented to person, place, and time.     ED Results / Procedures / Treatments   Labs (all labs ordered are listed, but only abnormal results are displayed) Labs Reviewed  BASIC METABOLIC PANEL - Abnormal; Notable for the following components:      Result Value   Chloride 96 (*)    CO2 36 (*)    Glucose, Bld 124 (*)    All other components within normal limits  CBC WITH DIFFERENTIAL/PLATELET - Abnormal; Notable for the following components:  WBC 16.4 (*)    Neutro Abs 14.8 (*)    Abs Immature Granulocytes 0.11 (*)    All other components within normal limits  RESP PANEL BY RT-PCR (RSV, FLU A&B, COVID)  [id number]  BRAIN NATRIURETIC PEPTIDE    EKG EKG Interpretation  Date/Time:  Thursday January 16 2023 11:19:53 EST Ventricular Rate:  104 PR Interval:  114 QRS Duration: 83 QT Interval:  306 QTC Calculation: 403 R Axis:   81 Text Interpretation: Sinus tachycardia Borderline right axis deviation Confirmed by Octaviano Glow [phone]) on 01/16/2023 11:40:28 AM  Radiology DG Chest Port 1 View  Result Date: 01/16/2023 CLINICAL DATA:  Shortness of breath, history COPD, worsening symptoms since beginning of January following the flu EXAM: PORTABLE CHEST 1 VIEW COMPARISON:  Portable exam 1130 hours compared to 07/16/2022 FINDINGS: Normal heart size, mediastinal contours, and pulmonary vascularity. Atherosclerotic calcification aorta. Lungs emphysematous but clear. No pulmonary infiltrate, pleural effusion, or pneumothorax. Bones demineralized. IMPRESSION: Emphysematous changes without acute infiltrate. Aortic Atherosclerosis (ICD10-I70.0) and Emphysema (ICD10-J43.9). Electronically Signed   By: Lavonia Dana M.D.   On: 01/16/2023 12:00    Procedures .Critical Care  Performed by: Domenic Moras, PA-C Authorized by: Domenic Moras, PA-C   Critical care provider statement:    Critical care time (minutes):  33   Critical care  was time spent personally by me on the following activities:  Development of treatment plan with patient or surrogate, discussions with consultants, evaluation of patient's response to treatment, examination of patient, ordering and review of laboratory studies, ordering and review of radiographic studies, ordering and performing treatments and interventions, pulse oximetry, re-evaluation of patient's condition and review of old charts     Medications Ordered in ED Medications  albuterol (PROVENTIL,VENTOLIN) solution continuous neb (7.5 mg/hr Nebulization Not Given 01/16/23 1146)  magnesium sulfate (IV Push/IM) injection 1 g (1 g Intravenous Given 01/16/23 1144)  methylPREDNISolone sodium succinate (SOLU-MEDROL) 125 mg/2 mL injection 125 mg (125 mg Intravenous Given 01/16/23 1143)  albuterol (PROVENTIL) (2.5 MG/3ML) 0.083% nebulizer solution (7.5 mg  Given 01/16/23 1143)    ED Course/ Medical Decision Making/ A&P Clinical Course as of 01/16/23 1301  Thu Jan 16, 2023  1224 This is a 77 year old male with a history of COPD wears 2 L nasal cannula as needed at home, usually overnight, presented ED with persistent shortness of breath for about 3 weeks.  This began with influenza diagnosis.  Since then he has been on 2 courses of prednisone, 2 courses of antibiotics, and he is doing nebulizer treatments every 4 hours at home.  He is needing to wear 2 to 3 L nasal cannula all day at home.  His symptoms got a little bit better over the weekend and then abruptly worsened 2 days ago, and again even today.  He could not make it to his pulmonology appointment this afternoon.  He came here instead.  On exam the patient is currently getting a nebulizer treatment.  He has diffuse expiratory wheezing.  He is able to speak in full sentences but he is using some intra-abdominal muscles for respiration.  He does feel tired.  We discussed putting him on BiPAP for an hour or two, and I do think you will need medical  admission at this point for having failed outpatient treatment.  He is in agreement with this plan.  His labs are notable for leukocytosis in the setting of persistent steroid use, I have a lower suspicion for infection or pneumonia.  X-ray does not  show pneumonia.  His wife is also present at the bedside for history and exam. [MT]    Clinical Course User Index [MT] Trifan, Carola Rhine, MD                             Medical Decision Making Amount and/or Complexity of Data Reviewed Labs: ordered. Radiology: ordered.  Risk Prescription drug management. Decision regarding hospitalization.   BP (!) 162/73 (BP Location: Right Arm)   Pulse (!) 104   Temp 98 F (36.7 C) (Oral)   Resp (!) 24   Ht '5\' 6"'$  (1.676 m)   Wt 73.5 kg   SpO2 99%   BMI 26.15 kg/m   80:71 AM  77 year old male with significant history of COPD on home O2 at 2 L, history of diabetes, paroxysmal atrial fibrillation, CAD, CHF presenting complaining of shortness of breath.  Patient reports he tested positive for influenza B approximately 3 weeks ago.  Since then he has had waxing waning episodes of increased shortness of breath.  States he has been through 2 rounds of antibiotics and multiple rounds of prednisone.  For the past few days he endorsed worsening shortness of breath with any simple exertion.  He felt his chest is tight and increasing wheezing.  Denies fever chills body aches worsening productive cough.  He is using his nebulizer 4 times daily, and rescue inhaler as needed.  He is on 2 L of oxygen throughout the day.  He denies any fluid retention.  He denies any hemoptysis.  Patient has a Forensic scientist.  He is not on any blood thinner medication.  On exam, this is an elderly male wearing supplemental oxygen sitting in bed appears in mild respiratory discomfort.  He is speaking in shortened sentences and is tachypneic.  Heart with irregular rhythm but no murmur rubs gallops.  Chest with diminished breath sounds and  expiratory wheezes heard no rales or rhonchi.  He has a protuberant abdomen nontender to palpation.  He does not have any significant peripheral edema and no JVD.  Vital sign review noted for elevated blood pressure 162/73, he is afebrile, no hypoxia on 2 L home O2.  He is mildly tachypneic and tachycardic.  -Labs ordered, independently viewed and interpreted by me.  Labs remarkable for WBC 16.4, likely due to recent prednisone use.  CO2 36. Normal BNP, doubt CHF exacerbation.  Negative resp viral panel -The patient was maintained on a cardiac monitor.  I personally viewed and interpreted the cardiac monitored which showed an underlying rhythm of: sinus tachycardia -Imaging independently viewed and interpreted by me and I agree with radiologist's interpretation.  Result remarkable for CXR without focal infiltrates -This patient presents to the ED for concern of sob, this involves an extensive number of treatment options, and is a complaint that carries with it a high risk of complications and morbidity.  The differential diagnosis includes COPD exacerbation, CHF, viral illness, pna, ptx -Co morbidities that complicate the patient evaluation includes copd -Treatment includes mag, solumedrol, bipap, albuterol nebs -Reevaluation of the patient after these medicines showed that the patient improved -PCP office notes or outside notes reviewed -Discussion with specialist [facility] Dr. Olevia Bowens who agrees with facilitating admission -Escalation to admission/observation considered: patients feels comfortable with admission  1:04 PM Patient here with worsening shortness of breath likely in the setting for COPD exacerbation.  Patient failed outpatient treatment and now requiring BiPAP as needed along with Solu-Medrol, albuterol continuous  nebs, mag sulfate, and cardiac monitoring.  I appreciate consultation from [facility] hospitalist Dr. Olevia Bowens who agrees to admit patient for further management.  Patient is  voicing some improvement of his symptoms with current treatment.         Final Clinical Impression(s) / ED Diagnoses Final diagnoses:  COPD exacerbation [facility])    Rx / DC Orders ED Discharge Orders     None         Domenic Moras, PA-C 01/16/23 1306    Wyvonnia Dusky, MD 01/16/23 1816

## 2023-01-16 NOTE — ED Notes (Signed)
RT assessed patient in the waiting room. He is currently wearing his home 02 at 2.5L. SAT 100%. Stated he had flu 1/19 and has been more SOB since, having to wear his home 02 more often. Take home nebs, has one at 0930. BBS diminished in bases. RT to monitor further in room.

## 2023-01-16 NOTE — Progress Notes (Signed)
Plan of Care Note for accepted transfer   Patient: Paul [name] MRN: [id number]   [place]: 01/16/2023  Facility requesting transfer: [facility].  Requesting Provider: Domenic Moras, PA-C and M. Trifan, MD Reason for transfer: Acute respiratory failure due to COPD exacerbation. Facility course:  Per ED provider:  " 77 year old male with significant history of COPD on home O2 at 2 L, history of diabetes, paroxysmal atrial fibrillation, CAD, CHF presenting complaining of shortness of breath.  Patient reports he tested positive for influenza B approximately 3 weeks ago.  Since then he has had waxing waning episodes of increased shortness of breath.  States he has been through 2 rounds of antibiotics and multiple rounds of prednisone.  For the past few days he endorsed worsening shortness of breath with any simple exertion.  He felt his chest is tight and increasing wheezing.  Denies fever chills body aches worsening productive cough.  He is using his nebulizer 4 times daily, and rescue inhaler as needed.  He is on 2 L of oxygen throughout the day.  He denies any fluid retention.  He denies any hemoptysis.  Patient has a Forensic scientist.  He is not on any blood thinner medication."  Lab work: Brain natriuretic peptide [[id number]]   Collected: 01/16/23 1134   Updated: 01/16/23 1225   Specimen Type: Blood    B Natriuretic Peptide 66.6 pg/mL  Resp panel by RT-PCR (RSV, Flu A&B, Covid) Nasopharyngeal Swab [[id number]]   Collected: 01/16/23 1120   Updated: 01/16/23 1215   Specimen Type: Nasal Swab   Specimen Source: Nasopharyngeal Swab    SARS Coronavirus 2 by RT PCR NEGATIVE   Influenza A by PCR NEGATIVE   Influenza B by PCR NEGATIVE   Resp Syncytial Virus by PCR NEGATIVE  Basic metabolic panel [[id number]] (Abnormal)   Collected: 01/16/23 1134   Updated: 01/16/23 1154   Specimen Type: Blood    Sodium 140 mmol/L   Potassium 4.4 mmol/L   Chloride 96 Low  mmol/L   CO2 36 High  mmol/L   Glucose,  Bld 124 High  mg/dL   BUN 18 mg/dL   Creatinine, Ser 0.77 mg/dL   Calcium 9.6 mg/dL   GFR, Estimated >60 mL/min   Anion gap 8  CBC with Differential [[id number]] (Abnormal)   Collected: 01/16/23 1134   Updated: 01/16/23 1140   Specimen Type: Blood    WBC 16.4 High  K/uL   RBC 5.03 MIL/uL   Hemoglobin 13.1 g/dL   HCT 42.6 %   MCV 84.7 fL   MCH 26.0 pg   MCHC 30.8 g/dL   RDW 15.0 %   Platelets 274 K/uL   nRBC 0.0 %   Neutrophils Relative % 90 %   Neutro Abs 14.8 High  K/uL   Lymphocytes Relative 4 %   Lymphs Abs 0.7 K/uL   Monocytes Relative 5 %   Monocytes Absolute 0.8 K/uL   Eosinophils Relative 0 %   Eosinophils Absolute 0.1 K/uL   Basophils Relative 0 %   Basophils Absolute 0.0 K/uL   Immature Granulocytes 1 %   Abs Immature Granulocytes 0.11 High  K/uL   Imaging: Portable 1 view chest shows emphysematous changes without acute infiltrate.   Plan of care: The patient has received supplemental oxygen, BiPAP ventilation assistance, bronchodilators, magnesium sulfate and methylprednisolone.  He is accepted for admission to Progressive unit, at [facility]..   Author: Reubin Milan, MD 01/16/2023  Check [email or web address] for on-call coverage.  Nursing  staff, Please call [place] number on Amion as soon as patient's arrival, so appropriate admitting provider can evaluate the pt.

## 2023-01-16 NOTE — ED Notes (Signed)
Spoke with [facility] re: hospitalist consult for [place], [place]

## 2023-01-16 NOTE — ED Notes (Signed)
ED TO INPATIENT HANDOFF REPORT  ED Nurse Name and Phone #: Angelica Chessman Name/Age/Gender Paul [name] 77 y.o. male Room/Bed: MH07/MH07  Code Status   Code Status: Prior  Home/SNF/Other Home Patient oriented to: self, place, time, and situation Is this baseline? Yes   Triage Complete: Triage complete  Chief Complaint COPD with acute exacerbation [facility]) [J44.1]  Triage Note Pt has history of COPD, pt states his breathing has been getting worse since the beginning of January after getting the flu.  Pt saw PCP 2 weeks ago and was given steroids and continues to have problems breathing.  Last nebulizer treatment was at 0900.     Allergies Allergies  Allergen Reactions   Tape Other (See Comments)    PLEASE USE COBAN WRAP IN LIEU OF "TAPE," as it "pulls off the skin!!   Daliresp [Roflumilast] Diarrhea and Nausea And Vomiting   Levaquin [Levofloxacin] Palpitations and Other (See Comments)    Made the B/P fluctuate and heart raced   Alendronate Anxiety and Other (See Comments)    Jittery/nervous    Level of Care/Admitting Diagnosis ED Disposition     ED Disposition  Admit   Condition  --   [place]: [facility] [[id number]]  Level of Care: Progressive [102]  Admit to Progressive based on following criteria: RESPIRATORY PROBLEMS hypoxemic/hypercapnic respiratory failure that is responsive to NIPPV (BiPAP) or High Flow Nasal Cannula (6-80 lpm). Frequent assessment/intervention, no > Q2 hrs < Q4 hrs, to maintain oxygenation and pulmonary hygiene.  Interfacility transfer: Yes  May place patient in observation at [facility] or [place] if equivalent level of care is available:: Yes  Covid Evaluation: Asymptomatic - no recent exposure (last 10 days) testing not required  Diagnosis: COPD with acute exacerbation [facility]) [[id number]]  Admitting Physician: Reubin Milan [[id number]]  Attending Physician: Reubin Milan [[id number]]           B Medical/Surgery History Past Medical History:  Diagnosis Date   Anemia    Aortic atherosclerosis ([place])    Aspiration pneumonia due to inhalation of vomitus ([place]) 02/03/2022   Atrial fibrillation ([place])    Back pain    COPD (chronic obstructive pulmonary disease) ([place])    COPD exacerbation ([place]) 12/27/2021   COVID-19 12/24/2020   Diabetes mellitus without complication ([place])    Dizziness 04/08/2022   Elevated PSA    GAD (generalized anxiety disorder)    GERD (gastroesophageal reflux disease)    High cholesterol    Hypertension    Lingular pneumonia 01/05/2021   See cxr 01/06/20 rx with zpak/ cefipime x 5 days and then developed purulent sputum again 01/12/21 so rx levcaquin 500 mg daily x 7 days then return for cxr before more    Lung nodule < 6cm on CT 05/29/2016   Osteoporosis    Oxygen deficiency    Pneumonia    January 2022   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 Quentin Ore with a 20 mm Watchman FLX device   RSV bronchitis 01/27/2022   Vertebral compression fracture ([place]) 05/29/2019   T8 compression fracture noted on CT scan 05/28/2019 inpatient with chronic steroid dependent COPD - rx calcitonin nasal spray rx per PCP    Past Surgical History:  Procedure Laterality Date   CATARACT EXTRACTION Right    ELBOW SURGERY  07/2022   surgery on olecranon bursa. Dr. Greta Doom   IR KYPHO EA ADDL LEVEL THORACIC OR  LUMBAR  11/28/2021   LEFT ATRIAL APPENDAGE OCCLUSION N/A 08/02/2021   Procedure: LEFT ATRIAL APPENDAGE OCCLUSION;  Surgeon: Sherren Mocha, MD;  Location: [place] CV LAB;  Service: Cardiovascular;  Laterality: N/A;   SKIN SURGERY     shoulders and chest   TEE WITHOUT CARDIOVERSION N/A 08/02/2021   Procedure: TRANSESOPHAGEAL ECHOCARDIOGRAM (TEE);  Surgeon: Sherren Mocha, MD;  Location: [place] [facility];  Service: Cardiovascular;  Laterality: N/A;   TEE WITHOUT CARDIOVERSION N/A 09/13/2021   Procedure: TRANSESOPHAGEAL  ECHOCARDIOGRAM (TEE);  Surgeon: Sanda Klein, MD;  Location: [facility] ENDOSCOPY;  Service: Cardiovascular;  Laterality: N/A;     A IV Location/Drains/Wounds Patient Lines/Drains/Airways Status     Active Line/Drains/Airways     Name Placement date Placement time Site Days   Peripheral IV 01/16/23 20 G Anterior;Distal;Right;Upper Antecubital 01/16/23  1140  Antecubital  less than 1            Intake/Output Last 24 hours No intake or output data in the 24 hours ending 01/16/23 1853  Labs/Imaging Results for orders placed or performed during the hospital encounter of 01/16/23 (from the past 48 hour(s))  Resp panel by RT-PCR (RSV, Flu A&B, Covid) Nasopharyngeal Swab     Status: None   Collection Time: 01/16/23 11:20 AM   Specimen: Nasopharyngeal Swab; Nasal Swab  Result Value Ref Range   SARS Coronavirus 2 by RT PCR NEGATIVE NEGATIVE    Comment: (NOTE) SARS-CoV-2 target nucleic acids are NOT DETECTED.  The SARS-CoV-2 RNA is generally detectable in upper respiratory specimens during the acute phase of infection. The lowest concentration of SARS-CoV-2 viral copies this assay can detect is 138 copies/mL. A negative result does not preclude SARS-Cov-2 infection and should not be used as the sole basis for treatment or other patient management decisions. A negative result may occur with  improper specimen collection/handling, submission of specimen other than nasopharyngeal swab, presence of viral mutation(s) within the areas targeted by this assay, and inadequate number of viral copies(<138 copies/mL). A negative result must be combined with clinical observations, patient history, and epidemiological information. The expected result is Negative.  Fact Sheet for Patients:  EntrepreneurPulse.com.au  Fact Sheet for Healthcare Providers:  IncredibleEmployment.be  This test is no t yet approved or cleared by the [place] FDA and  has been  authorized for detection and/or diagnosis of SARS-CoV-2 by FDA under an Emergency Use Authorization (EUA). This EUA will remain  in effect (meaning this test can be used) for the duration of the COVID-19 declaration under Section 564(b)(1) of the Act, 21 U.S.C.section 360bbb-3(b)(1), unless the authorization is terminated  or revoked sooner.       Influenza A by PCR NEGATIVE NEGATIVE   Influenza B by PCR NEGATIVE NEGATIVE    Comment: (NOTE) The Xpert Xpress SARS-CoV-2/FLU/RSV plus assay is intended as an aid in the diagnosis of influenza from Nasopharyngeal swab specimens and should not be used as a sole basis for treatment. Nasal washings and aspirates are unacceptable for Xpert Xpress SARS-CoV-2/FLU/RSV testing.  Fact Sheet for Patients: EntrepreneurPulse.com.au  Fact Sheet for Healthcare Providers: IncredibleEmployment.be  This test is not yet approved or cleared by the [place] FDA and has been authorized for detection and/or diagnosis of SARS-CoV-2 by FDA under an Emergency Use Authorization (EUA). This EUA will remain in effect (meaning this test can be used) for the duration of the COVID-19 declaration under Section 564(b)(1) of the Act, 21 U.S.C. section 360bbb-3(b)(1), unless the authorization is terminated or revoked.  Resp Syncytial Virus by PCR NEGATIVE NEGATIVE    Comment: (NOTE) Fact Sheet for Patients: EntrepreneurPulse.com.au  Fact Sheet for Healthcare Providers: IncredibleEmployment.be  This test is not yet approved or cleared by the [place] FDA and has been authorized for detection and/or diagnosis of SARS-CoV-2 by FDA under an Emergency Use Authorization (EUA). This EUA will remain in effect (meaning this test can be used) for the duration of the COVID-19 declaration under Section 564(b)(1) of the Act, 21 U.S.C. section 360bbb-3(b)(1), unless the authorization is  terminated or revoked.  Performed at [facility], [address] [place], [place] [address]   Basic metabolic panel     Status: Abnormal   Collection Time: 01/16/23 11:34 AM  Result Value Ref Range   Sodium 140 135 - 145 mmol/L   Potassium 4.4 3.5 - 5.1 mmol/L   Chloride 96 (L) 98 - 111 mmol/L   CO2 36 (H) 22 - 32 mmol/L   Glucose, Bld 124 (H) 70 - 99 mg/dL    Comment: Glucose reference range applies only to samples taken after fasting for at least 8 hours.   BUN 18 8 - 23 mg/dL   Creatinine, Ser 0.77 0.61 - 1.24 mg/dL   Calcium 9.6 8.9 - 10.3 mg/dL   GFR, Estimated >60 >60 mL/min    Comment: (NOTE) Calculated using the CKD-EPI Creatinine Equation (2021)    Anion gap 8 5 - 15    Comment: Performed at [facility], [address] [place], [place] [address]  CBC with Differential     Status: Abnormal   Collection Time: 01/16/23 11:34 AM  Result Value Ref Range   WBC 16.4 (H) 4.0 - 10.5 K/uL   RBC 5.03 4.22 - 5.81 MIL/uL   Hemoglobin 13.1 13.0 - 17.0 g/dL   HCT 42.6 39.0 - 52.0 %   MCV 84.7 80.0 - 100.0 fL   MCH 26.0 26.0 - 34.0 pg   MCHC 30.8 30.0 - 36.0 g/dL   RDW 15.0 11.5 - 15.5 %   Platelets 274 150 - 400 K/uL   nRBC 0.0 0.0 - 0.2 %   Neutrophils Relative % 90 %   Neutro Abs 14.8 (H) 1.7 - 7.7 K/uL   Lymphocytes Relative 4 %   Lymphs Abs 0.7 0.7 - 4.0 K/uL   Monocytes Relative 5 %   Monocytes Absolute 0.8 0.1 - 1.0 K/uL   Eosinophils Relative 0 %   Eosinophils Absolute 0.1 0.0 - 0.5 K/uL   Basophils Relative 0 %   Basophils Absolute 0.0 0.0 - 0.1 K/uL   Immature Granulocytes 1 %   Abs Immature Granulocytes 0.11 (H) 0.00 - 0.07 K/uL    Comment: Performed at [facility], [address] [place], [place] [address]  Brain natriuretic peptide     Status: None   Collection Time: 01/16/23 11:34 AM  Result Value Ref Range   B Natriuretic Peptide 66.6 0.0 - 100.0 pg/mL    Comment: Performed at [place] [facility], [address] [place], [place] [address]   DG Chest [place] 1 View  Result Date: 01/16/2023 CLINICAL DATA:  Shortness of breath, history COPD, worsening symptoms since beginning of January following the flu EXAM: PORTABLE CHEST 1 VIEW COMPARISON:  Portable exam 1130 hours compared to 07/16/2022 FINDINGS: Normal heart size, mediastinal contours, and pulmonary vascularity. Atherosclerotic calcification aorta. Lungs emphysematous but clear. No pulmonary infiltrate, pleural effusion, or pneumothorax. Bones demineralized. IMPRESSION: Emphysematous  changes without acute infiltrate. Aortic Atherosclerosis (ICD10-I70.0) and Emphysema (ICD10-J43.9). Electronically Signed   By: Lavonia Dana M.D.   On: 01/16/2023 12:00    Pending Labs Unresulted Labs (From admission, onward)    None       Vitals/Pain Today's Vitals   01/16/23 1545 01/16/23 1600 01/16/23 1615 01/16/23 1630  BP:  135/79  (!) 148/77  Pulse: 95 98 (!) 102 (!) 104  Resp: (!) 32 (!) 28 (!) 31 (!) 29  Temp:      TempSrc:      SpO2: 100% 100% 98% 97%  Weight:      Height:      PainSc:        Isolation Precautions No active isolations  Medications Medications  albuterol (PROVENTIL,VENTOLIN) solution continuous neb (7.5 mg/hr Nebulization Not Given 01/16/23 1146)  magnesium sulfate (IV Push/IM) injection 1 g (1 g Intravenous Given 01/16/23 1144)  methylPREDNISolone sodium succinate (SOLU-MEDROL) 125 mg/2 mL injection 125 mg (125 mg Intravenous Given 01/16/23 1143)  albuterol (PROVENTIL) (2.5 MG/3ML) 0.083% nebulizer solution (7.5 mg  Given 01/16/23 1143)    Mobility walks     Focused Assessments Pulmonary Assessment Handoff:  Lung sounds: Bilateral Breath Sounds: Diminished O2 Device: Nasal Cannula O2 Flow Rate (L/min): 2 L/min    R Recommendations: See Admitting Provider Note  Report given to:   Additional Notes: Pt is alert and oriented x 4. Pt is on 2 L via nasal cannula. Pt has been on Bipap this shift for 1.5 hours per his  request. Pt does use oxygen at home as needed. Pt able to ambulate without assistance. Pt can voice needs. Pt is on Cardiac monitor and is currently in NSR. Pt is being admitted for COPD exacerbation.

## 2023-01-17 DIAGNOSIS — M810 Age-related osteoporosis without current pathological fracture: Secondary | ICD-10-CM | POA: Diagnosis present

## 2023-01-17 DIAGNOSIS — I700 Atherosclerosis of aorta: Secondary | ICD-10-CM | POA: Diagnosis present

## 2023-01-17 DIAGNOSIS — I2510 Atherosclerotic heart disease of native coronary artery without angina pectoris: Secondary | ICD-10-CM | POA: Diagnosis present

## 2023-01-17 DIAGNOSIS — E1169 Type 2 diabetes mellitus with other specified complication: Secondary | ICD-10-CM

## 2023-01-17 DIAGNOSIS — Z7952 Long term (current) use of systemic steroids: Secondary | ICD-10-CM | POA: Diagnosis not present

## 2023-01-17 DIAGNOSIS — J9621 Acute and chronic respiratory failure with hypoxia: Secondary | ICD-10-CM

## 2023-01-17 DIAGNOSIS — K219 Gastro-esophageal reflux disease without esophagitis: Secondary | ICD-10-CM | POA: Diagnosis present

## 2023-01-17 DIAGNOSIS — Z87891 Personal history of nicotine dependence: Secondary | ICD-10-CM | POA: Diagnosis not present

## 2023-01-17 DIAGNOSIS — Z95818 Presence of other cardiac implants and grafts: Secondary | ICD-10-CM | POA: Diagnosis not present

## 2023-01-17 DIAGNOSIS — Z1152 Encounter for screening for COVID-19: Secondary | ICD-10-CM | POA: Diagnosis not present

## 2023-01-17 DIAGNOSIS — I110 Hypertensive heart disease with heart failure: Secondary | ICD-10-CM | POA: Diagnosis present

## 2023-01-17 DIAGNOSIS — Z9841 Cataract extraction status, right eye: Secondary | ICD-10-CM | POA: Diagnosis not present

## 2023-01-17 DIAGNOSIS — Z888 Allergy status to other drugs, medicaments and biological substances status: Secondary | ICD-10-CM | POA: Diagnosis not present

## 2023-01-17 DIAGNOSIS — Z9981 Dependence on supplemental oxygen: Secondary | ICD-10-CM | POA: Diagnosis not present

## 2023-01-17 DIAGNOSIS — I5032 Chronic diastolic (congestive) heart failure: Secondary | ICD-10-CM | POA: Diagnosis present

## 2023-01-17 DIAGNOSIS — F411 Generalized anxiety disorder: Secondary | ICD-10-CM | POA: Diagnosis present

## 2023-01-17 DIAGNOSIS — E782 Mixed hyperlipidemia: Secondary | ICD-10-CM

## 2023-01-17 DIAGNOSIS — J9600 Acute respiratory failure, unspecified whether with hypoxia or hypercapnia: Secondary | ICD-10-CM | POA: Diagnosis present

## 2023-01-17 DIAGNOSIS — Z79899 Other long term (current) drug therapy: Secondary | ICD-10-CM | POA: Diagnosis not present

## 2023-01-17 DIAGNOSIS — J441 Chronic obstructive pulmonary disease with (acute) exacerbation: Secondary | ICD-10-CM | POA: Diagnosis present

## 2023-01-17 DIAGNOSIS — J9601 Acute respiratory failure with hypoxia: Secondary | ICD-10-CM | POA: Diagnosis not present

## 2023-01-17 DIAGNOSIS — Z7982 Long term (current) use of aspirin: Secondary | ICD-10-CM | POA: Diagnosis not present

## 2023-01-17 DIAGNOSIS — Z91048 Other nonmedicinal substance allergy status: Secondary | ICD-10-CM | POA: Diagnosis not present

## 2023-01-17 DIAGNOSIS — Z8616 Personal history of COVID-19: Secondary | ICD-10-CM | POA: Diagnosis not present

## 2023-01-17 DIAGNOSIS — I480 Paroxysmal atrial fibrillation: Secondary | ICD-10-CM | POA: Diagnosis present

## 2023-01-17 DIAGNOSIS — D649 Anemia, unspecified: Secondary | ICD-10-CM | POA: Diagnosis present

## 2023-01-17 LAB — GLUCOSE, CAPILLARY
Glucose-Capillary: 136 mg/dL — ABNORMAL HIGH (ref 70–99)
Glucose-Capillary: 167 mg/dL — ABNORMAL HIGH (ref 70–99)
Glucose-Capillary: 173 mg/dL — ABNORMAL HIGH (ref 70–99)
Glucose-Capillary: 93 mg/dL (ref 70–99)

## 2023-01-17 LAB — EXPECTORATED SPUTUM ASSESSMENT W GRAM STAIN, RFLX TO RESP C

## 2023-01-17 MED ORDER — CALCIUM CARBONATE ANTACID 500 MG PO CHEW
500.0000 mg | CHEWABLE_TABLET | Freq: Every day | ORAL | Status: DC
  Administered 2023-01-17 – 2023-01-19 (×3): 500 mg via ORAL
  Filled 2023-01-17 (×3): qty 3

## 2023-01-17 MED ORDER — LORATADINE 10 MG PO TABS
10.0000 mg | ORAL_TABLET | Freq: Every day | ORAL | Status: DC
  Administered 2023-01-17 – 2023-01-19 (×3): 10 mg via ORAL
  Filled 2023-01-17 (×3): qty 1

## 2023-01-17 MED ORDER — INSULIN ASPART 100 UNIT/ML IJ SOLN
0.0000 [IU] | Freq: Three times a day (TID) | INTRAMUSCULAR | Status: DC
  Administered 2023-01-17 – 2023-01-18 (×3): 2 [IU] via SUBCUTANEOUS
  Administered 2023-01-18: 1 [IU] via SUBCUTANEOUS

## 2023-01-17 MED ORDER — FERROUS SULFATE 325 (65 FE) MG PO TABS
325.0000 mg | ORAL_TABLET | Freq: Two times a day (BID) | ORAL | Status: DC
  Administered 2023-01-17 – 2023-01-19 (×5): 325 mg via ORAL
  Filled 2023-01-17 (×5): qty 1

## 2023-01-17 MED ORDER — ADULT MULTIVITAMIN W/MINERALS CH
1.0000 | ORAL_TABLET | Freq: Every day | ORAL | Status: DC
  Administered 2023-01-17 – 2023-01-19 (×3): 1 via ORAL
  Filled 2023-01-17 (×3): qty 1

## 2023-01-17 MED ORDER — MELATONIN 5 MG PO TABS: 5.0000 mg | ORAL_TABLET | Freq: Every day | ORAL | Status: DC

## 2023-01-17 MED ORDER — METHYLPREDNISOLONE SODIUM SUCC 40 MG IJ SOLR: 40.0000 mg | Freq: Three times a day (TID) | INTRAMUSCULAR | Status: DC

## 2023-01-17 MED ORDER — FLUTICASONE FUROATE-VILANTEROL 200-25 MCG/ACT IN AEPB
1.0000 | INHALATION_SPRAY | Freq: Every day | RESPIRATORY_TRACT | Status: DC
  Administered 2023-01-17 – 2023-01-19 (×3): 1 via RESPIRATORY_TRACT
  Filled 2023-01-17: qty 28

## 2023-01-17 MED ORDER — INSULIN ASPART 100 UNIT/ML IJ SOLN: 0.0000 [IU] | Freq: Every day | INTRAMUSCULAR | Status: DC

## 2023-01-17 MED ORDER — UMECLIDINIUM BROMIDE 62.5 MCG/ACT IN AEPB
1.0000 | INHALATION_SPRAY | Freq: Every day | RESPIRATORY_TRACT | Status: DC
  Administered 2023-01-17 – 2023-01-19 (×3): 1 via RESPIRATORY_TRACT
  Filled 2023-01-17: qty 7

## 2023-01-17 MED ORDER — FLUTICASONE PROPIONATE 50 MCG/ACT NA SUSP: 2.0000 | Freq: Every day | NASAL | Status: DC | PRN

## 2023-01-17 MED ORDER — METHYLPREDNISOLONE SODIUM SUCC 40 MG IJ SOLR
40.0000 mg | Freq: Two times a day (BID) | INTRAMUSCULAR | Status: DC
  Administered 2023-01-17 – 2023-01-18 (×2): 40 mg via INTRAVENOUS
  Filled 2023-01-17 (×2): qty 1

## 2023-01-17 MED ORDER — IPRATROPIUM-ALBUTEROL 0.5-2.5 (3) MG/3ML IN SOLN: 3.0000 mL | RESPIRATORY_TRACT | Status: DC

## 2023-01-17 MED ORDER — PREDNISONE 20 MG PO TABS: 40.0000 mg | ORAL_TABLET | Freq: Every day | ORAL | Status: DC

## 2023-01-17 MED ORDER — IPRATROPIUM-ALBUTEROL 0.5-2.5 (3) MG/3ML IN SOLN
3.0000 mL | RESPIRATORY_TRACT | Status: DC
  Filled 2023-01-17: qty 3

## 2023-01-17 MED ORDER — ALBUTEROL SULFATE HFA 108 (90 BASE) MCG/ACT IN AERS
2.0000 | INHALATION_SPRAY | RESPIRATORY_TRACT | Status: DC
  Administered 2023-01-17: 2 via RESPIRATORY_TRACT
  Filled 2023-01-17: qty 6.7

## 2023-01-17 MED ORDER — METHYLPREDNISOLONE SODIUM SUCC 125 MG IJ SOLR
60.0000 mg | Freq: Three times a day (TID) | INTRAMUSCULAR | Status: DC
  Administered 2023-01-17: 60 mg via INTRAVENOUS
  Filled 2023-01-17: qty 2

## 2023-01-17 MED ORDER — BUDESON-GLYCOPYRROL-FORMOTEROL 160-9-4.8 MCG/ACT IN AERO: 2.0000 | INHALATION_SPRAY | Freq: Two times a day (BID) | RESPIRATORY_TRACT | Status: DC

## 2023-01-17 MED ORDER — PANTOPRAZOLE SODIUM 40 MG PO TBEC
40.0000 mg | DELAYED_RELEASE_TABLET | Freq: Every day | ORAL | Status: DC
  Administered 2023-01-17 – 2023-01-19 (×3): 40 mg via ORAL
  Filled 2023-01-17 (×3): qty 1

## 2023-01-17 MED ORDER — INSULIN ASPART 100 UNIT/ML IJ SOLN
2.0000 [IU] | Freq: Three times a day (TID) | INTRAMUSCULAR | Status: DC
  Administered 2023-01-17 – 2023-01-19 (×7): 2 [IU] via SUBCUTANEOUS

## 2023-01-17 MED ORDER — ALBUTEROL SULFATE (2.5 MG/3ML) 0.083% IN NEBU
2.5000 mg/h | INHALATION_SOLUTION | RESPIRATORY_TRACT | Status: AC
  Administered 2023-01-17: 2.5 mg/h via RESPIRATORY_TRACT
  Filled 2023-01-17: qty 3

## 2023-01-17 MED ORDER — ALBUTEROL SULFATE HFA 108 (90 BASE) MCG/ACT IN AERS
2.0000 | INHALATION_SPRAY | Freq: Three times a day (TID) | RESPIRATORY_TRACT | Status: DC
  Administered 2023-01-17 – 2023-01-18 (×3): 2 via RESPIRATORY_TRACT

## 2023-01-17 MED ORDER — MELATONIN 5 MG PO TABS
5.0000 mg | ORAL_TABLET | Freq: Every day | ORAL | Status: DC
  Administered 2023-01-17 – 2023-01-18 (×2): 5 mg via ORAL
  Filled 2023-01-17 (×2): qty 1

## 2023-01-17 NOTE — H&P (Signed)
PCP:   [name], MD   Chief Complaint:  Shortness of breath  HPI: This is a 77 year old male with history of COPD on chronic nocturnal oxygen 2 L, paroxysmal atrial fibrillation, CAD, CHF, with history of diabetes mellitus.  For the past 3 weeks he has been battling with shortness of breath, wheezing and cough.  Approximately 3 weeks ago he was diagnosed with the flu, he states that his breathing never got better.  He has continued to wheeze, to the point where it wakes him up at night.  For the past week he has been using his oxygen around-the-clock.  He saw his PCP She gave him Rocephin and the Kenalog shot along with a prednisone taper and Augmentin for 10 days.  She also recommended he increase his oxygen to 3 L and uses nebulizer every 3-4 hours.  He states he has been doing this but it has not helped.  Yesterday he got to the point where he could not breathe, he called 911 and went to [facility] ER.  He denies any fevers or chills.  He was given IV steroid, IV mag, nebulizer treatments.  He continues to wheeze, he was sent to [facility] for admission.  Review of Systems:  The patient denies anorexia, fever, weight loss,, vision loss, decreased hearing, hoarseness, chest pain, syncope, dyspnea on exertion, peripheral edema, balance deficits, hemoptysis, abdominal pain, melena, hematochezia, severe indigestion/heartburn, hematuria, incontinence, genital sores, muscle weakness, suspicious skin lesions, transient blindness, difficulty walking, depression, unusual weight change, abnormal bleeding, enlarged lymph nodes, angioedema, and breast masses. Positives: Positive wheeze, positive cough, positive shortness of breath  Past Medical History: Past Medical History:  Diagnosis Date   Anemia    Aortic atherosclerosis (HCC)    Aspiration pneumonia due to inhalation of vomitus ([place]) 02/03/2022   Atrial fibrillation (HCC)    Back pain    COPD (chronic obstructive pulmonary disease) (HCC)    COPD  exacerbation ([place]) 12/27/2021   COVID-19 12/24/2020   Diabetes mellitus without complication (HCC)    Dizziness 04/08/2022   Elevated PSA    GAD (generalized anxiety disorder)    GERD (gastroesophageal reflux disease)    High cholesterol    Hypertension    Lingular pneumonia 01/05/2021   See cxr 01/06/20 rx with zpak/ cefipime x 5 days and then developed purulent sputum again 01/12/21 so rx levcaquin 500 mg daily x 7 days then return for cxr before more    Lung nodule < 6cm on CT 05/29/2016   Osteoporosis    Oxygen deficiency    Pneumonia    January 2022   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 Quentin Ore with a 20 mm Watchman FLX device   RSV bronchitis 01/27/2022   Vertebral compression fracture ([place]) 05/29/2019   T8 compression fracture noted on CT scan 05/28/2019 inpatient with chronic steroid dependent COPD - rx calcitonin nasal spray rx per PCP    Past Surgical History:  Procedure Laterality Date   CATARACT EXTRACTION Right    ELBOW SURGERY  07/2022   surgery on olecranon bursa. Dr. Greta Doom   IR KYPHO EA ADDL LEVEL THORACIC OR LUMBAR  11/28/2021   LEFT ATRIAL APPENDAGE OCCLUSION N/A 08/02/2021   Procedure: LEFT ATRIAL APPENDAGE OCCLUSION;  Surgeon: Sherren Mocha, MD;  Location: [facility];  Service: Cardiovascular;  Laterality: N/A;   SKIN SURGERY     shoulders and chest   TEE WITHOUT CARDIOVERSION N/A 08/02/2021   Procedure:  TRANSESOPHAGEAL ECHOCARDIOGRAM (TEE);  Surgeon: Sherren Mocha, MD;  Location: [facility];  Service: Cardiovascular;  Laterality: N/A;   TEE WITHOUT CARDIOVERSION N/A 09/13/2021   Procedure: TRANSESOPHAGEAL ECHOCARDIOGRAM (TEE);  Surgeon: Sanda Klein, MD;  Location: [facility] ENDOSCOPY;  Service: Cardiovascular;  Laterality: N/A;    Medications: Prior to Admission medications   Medication Sig Start Date End Date Taking? Authorizing Provider  acetaminophen (TYLENOL) 500 MG tablet Take 1,000 mg  by mouth at bedtime as needed (pain).    [provider]  albuterol (PROAIR HFA) 108 (90 Base) MCG/ACT inhaler Inhale 2 puffs into the lungs as needed for wheezing or shortness of breath.    [provider]  albuterol (PROVENTIL) (2.5 MG/3ML) 0.083% nebulizer solution Take 3 mLs (2.5 mg total) by nebulization every 6 (six) hours as needed for shortness of breath or wheezing. 10/23/22   Parrett, Fonnie Mu, NP  ALPRAZolam (XANAX) 0.5 MG tablet Take 1 tablet (0.5 mg total) by mouth 2 (two) times daily as needed for anxiety. 07/25/22   [name], MD  amoxicillin-clavulanate (AUGMENTIN) 875-125 MG tablet Take 1 tablet by mouth 2 (two) times daily. 01/06/23   CoxElnita Maxwell, MD  Ascorbic Acid (VITAMIN C) 1000 MG tablet Take 1,000 mg by mouth daily.    [provider]  aspirin EC 81 MG tablet Take 1 tablet (81 mg total) by mouth daily. Swallow whole. 01/28/22   Kathyrn Drown D, NP  atorvastatin (LIPITOR) 40 MG tablet TAKE 1 TABLET BY MOUTH EVERYDAY AT BEDTIME 11/26/22   Marge Duncans, PA-C  Budeson-Glycopyrrol-Formoterol (BREZTRI AEROSPHERE) 160-9-4.8 MCG/ACT AERO Inhale 2 puffs into the lungs 2 (two) times daily. 01/02/23   Cox, Elnita Maxwell, MD  calcium carbonate (OS-CAL) 600 MG TABS tablet Take 600 mg by mouth daily.    [provider]  Cholecalciferol (VITAMIN D3) 50 MCG (2000 UT) TABS Take 2,000 Units by mouth in the morning.    [provider]  denosumab (PROLIA) 60 MG/ML SOSY injection Inject 60 mg into the skin every 6 (six) months.    [provider]  ferrous sulfate 325 (65 FE) MG tablet Take 325 mg by mouth 2 (two) times daily with a meal.    [provider]  fluticasone (FLONASE) 50 MCG/ACT nasal spray Place 2 sprays into both nostrils daily as needed for allergies or rhinitis.    [provider]  guaiFENesin (MUCINEX) 600 MG 12 hr tablet Take 2 tablets (1,200 mg total) by mouth 2 (two) times daily. Patient taking differently: Take 1,200 mg  by mouth 2 (two) times daily. Taking 1 tablet twice a day 07/21/22   Regalado, Belkys A, MD  loratadine (CLARITIN) 10 MG tablet Take 10 mg by mouth in the morning.    [provider]  Multiple Vitamin (MULTIVITAMIN WITH MINERALS) TABS tablet Take 1 tablet by mouth daily with breakfast.    [provider]  OXYGEN Inhale 2 L/min into the lungs at bedtime.    [provider]  pantoprazole (PROTONIX) 40 MG tablet Take 1 tablet (40 mg total) by mouth 2 (two) times daily. 11/06/22   Tanda Rockers, MD  predniSONE (DELTASONE) 10 MG tablet Take 1 tablet (10 mg total) by mouth daily with breakfast. Patient taking differently: Take 10 mg by mouth daily with breakfast. Take 2 tablets daily 09/04/22   Parrett, Fonnie Mu, NP  predniSONE (DELTASONE) 20 MG tablet Take 3 tablets (60 mg total) by mouth daily with breakfast for 3 days, THEN 2 tablets (40  mg total) daily with breakfast for 3 days, THEN 1 tablet (20 mg total) daily with breakfast for 3 days, THEN 1 tablet (20 mg total) daily with breakfast for 21 days. 01/06/23 02/05/23  [name], MD  Probiotic Product (PROBIOTIC DAILY) CAPS Take 1 capsule by mouth in the morning.    [provider]  sertraline (ZOLOFT) 50 MG tablet Take 1 tablet (50 mg total) by mouth at bedtime. 09/16/22   [name], MD    Allergies:   Allergies  Allergen Reactions   Tape Other (See Comments)    PLEASE USE COBAN WRAP IN LIEU OF "TAPE," as it "pulls off the skin!!   Daliresp [Roflumilast] Diarrhea and Nausea And Vomiting   Levaquin [Levofloxacin] Palpitations and Other (See Comments)    Made the B/P fluctuate and heart raced   Alendronate Anxiety and Other (See Comments)    Jittery/nervous    Social History:  reports that he quit smoking about 13 years ago. His smoking use included cigarettes. He has a 104.00 pack-year smoking history. He has never used smokeless tobacco. He reports that he does not currently use alcohol. He reports that  he does not use drugs.  Family History: Family History  Problem Relation Age of Onset   Heart disease Brother    Heart disease Mother    Heart disease Father    Cancer Paternal Uncle     Physical Exam: Vitals:   01/16/23 1935 01/16/23 2030 01/16/23 2041 01/16/23 2219  BP:  (!) 159/70  (!) 166/78  Pulse:  96  94  Resp:  16  (!) 24  Temp:   98.4 F (36.9 C) 98 F (36.7 C)  TempSrc:   Oral Oral  SpO2: 96% 98%  98%  Weight:      Height:        General:  Alert and oriented times three, well developed and nourished, no acute distress Eyes: PERRLA, pink conjunctiva, no scleral icterus ENT: Moist oral mucosa, neck supple, no thyromegaly Lungs: lungs tight, mild wheeze, no use of accessory muscles Cardiovascular: regular rate and rhythm, no regurgitation, no gallops, no murmurs. No carotid bruits, no JVD Abdomen: soft, positive BS, non-tender, non-distended, no organomegaly, not an acute abdomen GU: not examined Neuro: CN II - XII grossly intact, sensation intact Musculoskeletal: strength 5/5 all extremities, no clubbing, cyanosis or edema, amp 3 middle fingers on right Skin: no rash, no subcutaneous crepitation, no decubitus, lots of bruising on B/L legs Psych: appropriate patient   Labs on Admission:  Recent Labs    01/16/23 1134  NA 140  K 4.4  CL 96*  CO2 36*  GLUCOSE 124*  BUN 18  CREATININE 0.77  CALCIUM 9.6    Recent Labs    01/16/23 1134  WBC 16.4*  NEUTROABS 14.8*  HGB 13.1  HCT 42.6  MCV 84.7  PLT 274   Micro Results: Recent Results (from the past 240 hour(s))  Resp panel by RT-PCR (RSV, Flu A&B, Covid) Nasopharyngeal Swab     Status: None   Collection Time: 01/16/23 11:20 AM   Specimen: Nasopharyngeal Swab; Nasal Swab  Result Value Ref Range Status   SARS Coronavirus 2 by RT PCR NEGATIVE NEGATIVE Final    Comment: (NOTE) SARS-CoV-2 target nucleic acids are NOT DETECTED.  The SARS-CoV-2 RNA is generally detectable in upper  respiratory specimens during the acute phase of infection. The lowest concentration of SARS-CoV-2 viral copies this assay can detect is 138 copies/mL. A negative result does not  preclude SARS-Cov-2 infection and should not be used as the sole basis for treatment or other patient management decisions. A negative result may occur with  improper specimen collection/handling, submission of specimen other than nasopharyngeal swab, presence of viral mutation(s) within the areas targeted by this assay, and inadequate number of viral copies(<138 copies/mL). A negative result must be combined with clinical observations, patient history, and epidemiological information. The expected result is Negative.  Fact Sheet for Patients:  EntrepreneurPulse.com.au  Fact Sheet for Healthcare Providers:  IncredibleEmployment.be  This test is no t yet approved or cleared by the [place] FDA and  has been authorized for detection and/or diagnosis of SARS-CoV-2 by FDA under an Emergency Use Authorization (EUA). This EUA will remain  in effect (meaning this test can be used) for the duration of the COVID-19 declaration under Section 564(b)(1) of the Act, 21 U.S.C.section 360bbb-3(b)(1), unless the authorization is terminated  or revoked sooner.       Influenza A by PCR NEGATIVE NEGATIVE Final   Influenza B by PCR NEGATIVE NEGATIVE Final    Comment: (NOTE) The Xpert Xpress SARS-CoV-2/FLU/RSV plus assay is intended as an aid in the diagnosis of influenza from Nasopharyngeal swab specimens and should not be used as a sole basis for treatment. Nasal washings and aspirates are unacceptable for Xpert Xpress SARS-CoV-2/FLU/RSV testing.  Fact Sheet for Patients: EntrepreneurPulse.com.au  Fact Sheet for Healthcare Providers: IncredibleEmployment.be  This test is not yet approved or cleared by the [place] FDA and has been  authorized for detection and/or diagnosis of SARS-CoV-2 by FDA under an Emergency Use Authorization (EUA). This EUA will remain in effect (meaning this test can be used) for the duration of the COVID-19 declaration under Section 564(b)(1) of the Act, 21 U.S.C. section 360bbb-3(b)(1), unless the authorization is terminated or revoked.     Resp Syncytial Virus by PCR NEGATIVE NEGATIVE Final    Comment: (NOTE) Fact Sheet for Patients: EntrepreneurPulse.com.au  Fact Sheet for Healthcare Providers: IncredibleEmployment.be  This test is not yet approved or cleared by the [place] FDA and has been authorized for detection and/or diagnosis of SARS-CoV-2 by FDA under an Emergency Use Authorization (EUA). This EUA will remain in effect (meaning this test can be used) for the duration of the COVID-19 declaration under Section 564(b)(1) of the Act, 21 U.S.C. section 360bbb-3(b)(1), unless the authorization is terminated or revoked.  Performed at [facility], [address] [place], [place] [address]      Radiological Exams on Admission: DG Chest [place] 1 View  Result Date: 01/16/2023 CLINICAL DATA:  Shortness of breath, history COPD, worsening symptoms since beginning of January following the flu EXAM: PORTABLE CHEST 1 VIEW COMPARISON:  Portable exam 1130 hours compared to 07/16/2022 FINDINGS: Normal heart size, mediastinal contours, and pulmonary vascularity. Atherosclerotic calcification aorta. Lungs emphysematous but clear. No pulmonary infiltrate, pleural effusion, or pneumothorax. Bones demineralized. IMPRESSION: Emphysematous changes without acute infiltrate. Aortic Atherosclerosis (ICD10-I70.0) and Emphysema (ICD10-J43.9). Electronically Signed   By: Lavonia Dana M.D.   On: 01/16/2023 12:00    Assessment/Plan Present on Admission:  COPD with acute exacerbation (HCC)  Acute on chronic respiratory failure with hypoxia (HCC) -Admit to  med telemetry -IV prednisone 60 mg IV Q8.  A.m. Dr. To wean -Scheduled DuoNebs every 4 hours, albuterol as needed every 2 -Continue oxygen, keep sats greater than 88% -Continue Claritin and Mucinex -No antibiotics initiated -Continuous nebulizer ordered x 1 now   Hyperlipidemia -Atorvastatin resumed   DM type  2 with diabetic mixed hyperlipidemia (HCC) -History of.  Will do sliding scale insulin.  No Lantus  Taylar Hartsough 01/17/2023, 12:29 AM

## 2023-01-17 NOTE — Progress Notes (Signed)
[facility] PROGRESS NOTE    Progress Note  Paul [name]  NFA:[id number] DOB: 09/06/1946 DOA: 01/16/2023 PCP: [name], MD     Brief Narrative:   Paul [name] is an 77 y.o. male past medical history of COPD on nocturnal 2 L of oxygen, paroxysmal atrial fibrillation not on anticoagulation, history of diabetes mellitus, chronic diastolic heart failure comes in for shortness of breath that started 3 weeks prior to admission comes in for acute COPD exacerbation    Assessment/Plan:   Acute respiratory failure with hypoxia due to COPD exacerbation: Still requiring 2 L of oxygen to keep saturations greater 97%. Agree with IV steroids inhalers and antibiotics. Has remained afebrile. Chest x-ray showed no infiltrates.  HLD (hyperlipidemia) Continue statins.  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HCC) Last A1c of 6.0, started on sliding scale insulin.  Paroxysmal atrial fibrillation (HCC) Noted rate control   DVT prophylaxis: lovenox Family Communication:none Status is: Observation The patient remains OBS appropriate and will d/c before 2 midnights.  Acute respiratory failure with hypoxia due to COPD exacerbation continues to be short of breath to work on 2 L of oxygen    Code Status:     Code Status Orders  (From admission, onward)           Start     Ordered   01/16/23 2242  Full code  Continuous       Question:  By:  Answer:  Consent: discussion documented in EHR   01/16/23 2243           Code Status History     Date Active Date Inactive Code Status Order ID Comments User Context   07/17/2022 0147 07/21/2022 1940 Full Code [id number]  Etta Quill, DO Inpatient   03/02/2022 1519 03/04/2022 1706 Full Code [id number]  Reubin Milan, MD Inpatient   02/04/2022 1828 02/08/2022 1753 Full Code [id number]  Marcelyn Bruins, MD Inpatient   12/27/2021 1208 01/04/2022 1620 Full Code [id number]  Emeterio Reeve, DO ED   08/02/2021 1209 08/03/2021  1632 Full Code [id number]  Vickie Epley, MD Inpatient   01/31/2021 0528 02/02/2021 1937 Full Code [id number]  Chotiner, Yevonne Aline, MD Inpatient   01/14/2021 1953 01/15/2021 1806 Full Code [id number]  Neena Rhymes, MD Inpatient   01/05/2021 2203 01/09/2021 2155 Full Code [id number]  Elwyn Reach, MD Inpatient   12/25/2020 2107 12/28/2020 2001 Full Code [id number]  Patrecia Pour, MD Inpatient   12/24/2020 1337 12/25/2020 2107 Full Code [id number]  Eustaquio Maize, PA-C ED   12/08/2020 1432 12/10/2020 1847 Full Code [id number]  Harold Hedge, MD Inpatient   04/01/2020 2011 04/06/2020 1712 Partial Code [id number]  Dessa Phi, DO Inpatient   03/04/2020 1630 03/09/2020 1827 Full Code [id number]  Tomma Rakers, MD Inpatient   02/17/2020 0327 02/20/2020 1638 Full Code [id number]  Rise Patience, MD Inpatient   01/05/2020 0006 01/11/2020 1737 Full Code [id number]  Shela Leff, MD Inpatient   05/28/2019 2320 06/03/2019 1530 Full Code [id number]  Shela Leff, MD ED   05/03/2019 1624 05/06/2019 1852 Full Code [id number]  Velna Ochs, MD Inpatient   03/19/2019 2005 03/25/2019 1647 Full Code [id number]  Guilford Shi, MD ED      Advance Directive Documentation    Flowsheet Row Most Recent Value  Type of Advance Directive Healthcare Power of Attorney, Living will  Pre-existing out of facility DNR order (yellow form or pink MOST form) --  "MOST" Form in Place? --  IV Access:   Peripheral IV   Procedures and diagnostic studies:   DG Chest Port 1 View  Result Date: 01/16/2023 CLINICAL DATA:  Shortness of breath, history COPD, worsening symptoms since beginning of January following the flu EXAM: PORTABLE CHEST 1 VIEW COMPARISON:  Portable exam 1130 hours compared to 07/16/2022 FINDINGS: Normal heart size, mediastinal contours, and pulmonary vascularity. Atherosclerotic calcification aorta. Lungs emphysematous but clear. No pulmonary infiltrate, pleural effusion, or  pneumothorax. Bones demineralized. IMPRESSION: Emphysematous changes without acute infiltrate. Aortic Atherosclerosis (ICD10-I70.0) and Emphysema (ICD10-J43.9). Electronically Signed   By: Paul Dana M.D.   On: 01/16/2023 12:00     Medical Consultants:   None.   Subjective:    Paul [name] relates his breathing is not improved.  Objective:    Vitals:   01/16/23 2041 01/16/23 2219 01/17/23 0230 01/17/23 0707  BP:  (!) 166/78 (!) 156/75 (!) 166/74  Pulse:  94 94 92  Resp:  (!) 24 (!) 24 (!) 22  Temp: 98.4 F (36.9 C) 98 F (36.7 C) 97.9 F (36.6 C) 98.2 F (36.8 C)  TempSrc: Oral Oral Oral Oral  SpO2:  98% 98% 99%  Weight:      Height:       SpO2: 99 % O2 Flow Rate (L/min): 2 L/min FiO2 (%): 100 %   Intake/Output Summary (Last 24 hours) at 01/17/2023 3846 Last data filed at 01/17/2023 0232 Gross per 24 hour  Intake --  Output 275 ml  Net -275 ml   Filed Weights   01/16/23 1114  Weight: 73.5 kg    Exam: General exam: In no acute distress. Respiratory system: Good air movement and wheezing bilaterally. Cardiovascular system: S1 & S2 heard, RRR. No JVD. Gastrointestinal system: Abdomen is nondistended, soft and nontender.  Extremities: No pedal edema. Skin: No rashes, lesions or ulcers Psychiatry: Judgement and insight appear normal. Mood & affect appropriate.    Data Reviewed:    Labs: Basic Metabolic Panel: Recent Labs  Lab 01/16/23 1134  NA 140  K 4.4  CL 96*  CO2 36*  GLUCOSE 124*  BUN 18  CREATININE 0.77  CALCIUM 9.6   GFR Estimated Creatinine Clearance: 69.8 mL/min (by C-G formula based on SCr of 0.77 mg/dL). Liver Function Tests: No results for input(s): "AST", "ALT", "ALKPHOS", "BILITOT", "PROT", "ALBUMIN" in the last 168 hours. No results for input(s): "LIPASE", "AMYLASE" in the last 168 hours. No results for input(s): "AMMONIA" in the last 168 hours. Coagulation profile No results for input(s): "INR", "PROTIME" in the last 168  hours. COVID-19 Labs  No results for input(s): "DDIMER", "FERRITIN", "LDH", "CRP" in the last 72 hours.  Lab Results  Component Value Date   SARSCOV2NAA NEGATIVE 01/16/2023   SARSCOV2NAA NEGATIVE 07/16/2022   [place] NEGATIVE 03/02/2022   [place] NEGATIVE 02/04/2022    CBC: Recent Labs  Lab 01/16/23 1134  WBC 16.4*  NEUTROABS 14.8*  HGB 13.1  HCT 42.6  MCV 84.7  PLT 274   Cardiac Enzymes: No results for input(s): "CKTOTAL", "CKMB", "CKMBINDEX", "TROPONINI" in the last 168 hours. BNP (last 3 results) No results for input(s): "PROBNP" in the last 8760 hours. CBG: No results for input(s): "GLUCAP" in the last 168 hours. D-Dimer: No results for input(s): "DDIMER" in the last 72 hours. Hgb A1c: No results for input(s): "HGBA1C" in the last 72 hours. Lipid Profile: No results for input(s): "CHOL", "HDL", "LDLCALC", "TRIG", "CHOLHDL", "LDLDIRECT" in the last 72 hours. Thyroid function studies: No results for input(s): "TSH", "T4TOTAL", "  T3FREE", "THYROIDAB" in the last 72 hours.  Invalid input(s): "FREET3" Anemia work up: No results for input(s): "VITAMINB12", "FOLATE", "FERRITIN", "TIBC", "IRON", "RETICCTPCT" in the last 72 hours. Sepsis Labs: Recent Labs  Lab 01/16/23 1134  WBC 16.4*   Microbiology Recent Results (from the past 240 hour(s))  Resp panel by RT-PCR (RSV, Flu A&B, Covid) Nasopharyngeal Swab     Status: None   Collection Time: 01/16/23 11:20 AM   Specimen: Nasopharyngeal Swab; Nasal Swab  Result Value Ref Range Status   SARS Coronavirus 2 by RT PCR NEGATIVE NEGATIVE Final    Comment: (NOTE) SARS-CoV-2 target nucleic acids are NOT DETECTED.  The SARS-CoV-2 RNA is generally detectable in upper respiratory specimens during the acute phase of infection. The lowest concentration of SARS-CoV-2 viral copies this assay can detect is 138 copies/mL. A negative result does not preclude SARS-Cov-2 infection and should not be used as the sole basis for  treatment or other patient management decisions. A negative result may occur with  improper specimen collection/handling, submission of specimen other than nasopharyngeal swab, presence of viral mutation(s) within the areas targeted by this assay, and inadequate number of viral copies(<138 copies/mL). A negative result must be combined with clinical observations, patient history, and epidemiological information. The expected result is Negative.  Fact Sheet for Patients:  EntrepreneurPulse.com.au  Fact Sheet for Healthcare Providers:  IncredibleEmployment.be  This test is no t yet approved or cleared by the [place] FDA and  has been authorized for detection and/or diagnosis of SARS-CoV-2 by FDA under an Emergency Use Authorization (EUA). This EUA will remain  in effect (meaning this test can be used) for the duration of the COVID-19 declaration under Section 564(b)(1) of the Act, 21 U.S.C.section 360bbb-3(b)(1), unless the authorization is terminated  or revoked sooner.       Influenza A by PCR NEGATIVE NEGATIVE Final   Influenza B by PCR NEGATIVE NEGATIVE Final    Comment: (NOTE) The Xpert Xpress SARS-CoV-2/FLU/RSV plus assay is intended as an aid in the diagnosis of influenza from Nasopharyngeal swab specimens and should not be used as a sole basis for treatment. Nasal washings and aspirates are unacceptable for Xpert Xpress SARS-CoV-2/FLU/RSV testing.  Fact Sheet for Patients: EntrepreneurPulse.com.au  Fact Sheet for Healthcare Providers: IncredibleEmployment.be  This test is not yet approved or cleared by the [place] FDA and has been authorized for detection and/or diagnosis of SARS-CoV-2 by FDA under an Emergency Use Authorization (EUA). This EUA will remain in effect (meaning this test can be used) for the duration of the COVID-19 declaration under Section 564(b)(1) of the Act, 21  U.S.C. section 360bbb-3(b)(1), unless the authorization is terminated or revoked.     Resp Syncytial Virus by PCR NEGATIVE NEGATIVE Final    Comment: (NOTE) Fact Sheet for Patients: EntrepreneurPulse.com.au  Fact Sheet for Healthcare Providers: IncredibleEmployment.be  This test is not yet approved or cleared by the [place] FDA and has been authorized for detection and/or diagnosis of SARS-CoV-2 by FDA under an Emergency Use Authorization (EUA). This EUA will remain in effect (meaning this test can be used) for the duration of the COVID-19 declaration under Section 564(b)(1) of the Act, 21 U.S.C. section 360bbb-3(b)(1), unless the authorization is terminated or revoked.  Performed at [facility], [address] [place], [place] [address]      Medications:    acidophilus  1 capsule Oral q AM   aspirin EC  81 mg Oral Daily   atorvastatin  40  mg Oral Daily   azithromycin  500 mg Oral Daily   enoxaparin (LOVENOX) injection  40 mg Subcutaneous Q24H   ferrous sulfate  325 mg Oral BID WC   guaiFENesin  600 mg Oral BID   ipratropium-albuterol  3 mL Nebulization Q4H   loratadine  10 mg Oral Daily   melatonin  5 mg Oral QHS   methylPREDNISolone (SOLU-MEDROL) injection  60 mg Intravenous Q8H   sertraline  50 mg Oral QHS   Continuous Infusions:    LOS: 0 days   Charlynne Cousins  [facility]  01/17/2023, 7:12 AM

## 2023-01-17 NOTE — Progress Notes (Signed)
Initial Nutrition Assessment  INTERVENTION:   -Multivitamin with minerals daily  NUTRITION DIAGNOSIS:   Increased nutrient needs related to chronic illness (COPD) as evidenced by estimated needs.  GOAL:   Patient will meet greater than or equal to 90% of their needs  MONITOR:   PO intake, Supplement acceptance, Labs, Weight trends, I & O's  REASON FOR ASSESSMENT:   Consult COPD Protocol  ASSESSMENT:   77 y.o. male past medical history of COPD on nocturnal 2 L of oxygen, paroxysmal atrial fibrillation not on anticoagulation, history of diabetes mellitus, chronic diastolic heart failure comes in for shortness of breath that started 3 weeks prior to admission comes in for acute COPD exacerbation  Patient in room, sitting in chair. Wife at bedside. Pt reports good appetite. He is currently consuming 100% of his meals. He did have the flu 3 weeks ago but states this didn't impact her appetite much and he was still eating. Pt takes multiple vitamin supplements at home: Vitamin D3, C, iron and a MVI.   Per patient, he has gained weight recently. States he has been unable to go to the gym as he usually does since he has been sick for most of January.   Medications: Risaquad, Tums, Ferrous sulfate  Labs reviewed: CBGs: 136-167   NUTRITION - FOCUSED PHYSICAL EXAM:  No depletions noted.  Diet Order:   Diet Order             Diet Heart Room service appropriate? Yes; Fluid consistency: Thin  Diet effective now                   EDUCATION NEEDS:   No education needs have been identified at this time  Skin:  Skin Assessment: Skin Integrity Issues: Skin Integrity Issues:: Other (Comment) Other: venous stasis ulcer on back  Last BM:  1/25  Height:   Ht Readings from Last 1 Encounters:  01/16/23 '5\' 6"'$  (1.676 m)    Weight:   Wt Readings from Last 1 Encounters:  01/16/23 73.5 kg    BMI:  Body mass index is 26.15 kg/m.  Estimated Nutritional Needs:   Kcal:   2000-2200  Protein:  95-105g  Fluid:  2L/day  Clayton Bibles, MS, RD, LDN Inpatient Clinical Dietitian Contact information available via Amion

## 2023-01-17 NOTE — Plan of Care (Signed)
Patient sitting in bed short of breath with exertion of walking to bed from stretcher.

## 2023-01-17 NOTE — Progress Notes (Signed)
OT Cancellation Note  Patient Details Name: Paul [name] MRN: [id number] DOB: 08-25-1946   Cancelled Treatment:    Reason Eval/Treat Not Completed: OT screened, no needs identified, will sign off. Patient independent and has no needs.  [name] [name] 01/17/2023, 12:59 PM

## 2023-01-17 NOTE — Evaluation (Signed)
Physical Therapy Evaluation Patient Details Name: Paul [name] MRN: [id number] DOB: 09/11/1946 Today's Date: 01/17/2023  History of Present Illness  77 y.o. male past medical history of COPD on nocturnal 2 L of oxygen, paroxysmal atrial fibrillation not on anticoagulation, history of diabetes mellitus, chronic diastolic heart failure comes in for shortness of breath that started 3 weeks prior to admission comes in for acute COPD exacerbation  Clinical Impression  Patient evaluated by Physical Therapy with no further acute PT needs identified. All education has been completed and the patient has no further questions.  Pt amb 300' and able to hold sats in 90s on RA until ~ 150' as sats decr to 13s. Returned pt to 2L with sats >91%  See below for any follow-up Physical Therapy or equipment needs. PT is signing off. Thank you for this referral.        Recommendations for follow up therapy are one component of a multi-disciplinary discharge planning process, led by the attending physician.  Recommendations may be updated based on patient status, additional functional criteria and insurance authorization.  Follow Up Recommendations No PT follow up      Assistance Recommended at Discharge PRN  Patient can return home with the following       Equipment Recommendations None recommended by PT  Recommendations for Other Services       Functional Status Assessment       Precautions / Restrictions Precautions Precautions: None Precaution Comments: O2 dependent at night Restrictions Weight Bearing Restrictions: No      Mobility  Bed Mobility               General bed mobility comments: in recliner on arrival    Transfers Overall transfer level: Independent Equipment used: None                    Ambulation/Gait Ambulation/Gait assistance: Modified independent (Device/Increase time), Supervision Gait Distance (Feet): 300 Feet Assistive device: None (Dinamap) Gait  Pattern/deviations: Step-through pattern, WFL(Within Functional Limits)       General Gait Details: no LOB, steady without holding dinamap however pushed dinamap part of distance; one standing rest break after 150' with SpO2= 84% on RA and DOE 3/4, returned pt to 2L O2 remainder of session with SpO2=/>91% and  2-3/4 DOE on return to room.  Stairs            Wheelchair Mobility    Modified Rankin (Stroke Patients Only)       Balance Overall balance assessment: No apparent balance deficits (not formally assessed)                                           Pertinent Vitals/Pain Pain Assessment Pain Assessment: No/denies pain    Home Living Family/patient expects to be discharged to:: Private residence Living Arrangements: Spouse/significant other Available Help at Discharge: Family;Available 24 hours/day Type of Home: House Home Access: Level entry       Home Layout: One level Home Equipment: Rollator (4 wheels);Shower seat;Grab bars - tub/shower      Prior Function Prior Level of Function : Independent/Modified Independent             Mobility Comments: Pt reports going to the gym and using the treadmill       Hand Dominance        Extremity/Trunk Assessment   Upper Extremity  Assessment Upper Extremity Assessment: Defer to OT evaluation    Lower Extremity Assessment Lower Extremity Assessment: Overall WFL for tasks assessed       Communication      Cognition Arousal/Alertness: Awake/alert Behavior During Therapy: WFL for tasks assessed/performed Overall Cognitive Status: Within Functional Limits for tasks assessed                                          General Comments      Exercises     Assessment/Plan    PT Assessment Patient does not need any further PT services  PT Problem List         PT Treatment Interventions      PT Goals (Current goals can be found in the Care Plan section)  Acute  Rehab PT Goals PT Goal Formulation: All assessment and education complete, DC therapy    Frequency       Co-evaluation               AM-PAC PT "6 Clicks" Mobility  Outcome Measure Help needed turning from your back to your side while in a flat bed without using bedrails?: None Help needed moving from lying on your back to sitting on the side of a flat bed without using bedrails?: None Help needed moving to and from a bed to a chair (including a wheelchair)?: None Help needed standing up from a chair using your arms (e.g., wheelchair or bedside chair)?: None Help needed to walk in hospital room?: None Help needed climbing 3-5 steps with a railing? : None 6 Click Score: 24    End of Session   Activity Tolerance: Patient tolerated treatment well Patient left: in chair;with call bell/phone within reach;with family/visitor present        Time: 1142-1206 PT Time Calculation (min) (ACUTE ONLY): 24 min   Charges:   PT Evaluation $PT Eval Low Complexity: 1 Low PT Treatments $Gait Training: 8-22 mins        Baxter Flattery, PT  Acute Rehab Dept [facility]) [phone]  [facility] Weekend Pager (Saturday/Sunday only)  [phone]  01/17/2023   [facility] 01/17/2023, 12:20 PM

## 2023-01-18 DIAGNOSIS — J441 Chronic obstructive pulmonary disease with (acute) exacerbation: Secondary | ICD-10-CM | POA: Diagnosis not present

## 2023-01-18 DIAGNOSIS — J9601 Acute respiratory failure with hypoxia: Secondary | ICD-10-CM

## 2023-01-18 LAB — GLUCOSE, CAPILLARY
Glucose-Capillary: 152 mg/dL — ABNORMAL HIGH (ref 70–99)
Glucose-Capillary: 104 mg/dL — ABNORMAL HIGH (ref 70–99)
Glucose-Capillary: 130 mg/dL — ABNORMAL HIGH (ref 70–99)

## 2023-01-18 MED ORDER — PREDNISONE 50 MG PO TABS
50.0000 mg | ORAL_TABLET | Freq: Every day | ORAL | Status: DC
  Administered 2023-01-19: 50 mg via ORAL
  Filled 2023-01-18: qty 1

## 2023-01-18 MED ORDER — ALBUTEROL SULFATE HFA 108 (90 BASE) MCG/ACT IN AERS
2.0000 | INHALATION_SPRAY | Freq: Two times a day (BID) | RESPIRATORY_TRACT | Status: DC
  Administered 2023-01-19: 2 via RESPIRATORY_TRACT

## 2023-01-18 NOTE — Progress Notes (Signed)
[facility] PROGRESS NOTE    Progress Note  Paul [name]  BDZ:[id number] DOB: [birth year] DOA: 01/16/2023 PCP: [name], MD     Brief Narrative:   Paul [name] is an 77 y.o. male past medical history of COPD on nocturnal 2 L of oxygen, paroxysmal atrial fibrillation not on anticoagulation, history of diabetes mellitus, chronic diastolic heart failure comes in for shortness of breath that started 3 weeks prior to admission comes in for acute COPD exacerbation    Assessment/Plan:   Acute respiratory failure with hypoxia due to COPD exacerbation: Still requiring 2 L of oxygen, try to wean to room air lungs are clear on physical exam Change steroids and antibiotics to orals. Has remained afebrile.  HLD (hyperlipidemia) Continue statins.  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HCC) Last A1c of 6.0, started on sliding scale insulin.  Paroxysmal atrial fibrillation (HCC) Noted rate control   DVT prophylaxis: lovenox Family Communication:none Status is: Observation The patient remains OBS appropriate and will d/c before 2 midnights.  Acute respiratory failure with hypoxia due to COPD exacerbation continues to be short of breath to work on 2 L of oxygen    Code Status:     Code Status Orders  (From admission, onward)           Start     Ordered   01/16/23 2242  Full code  Continuous       Question:  By:  Answer:  Consent: discussion documented in EHR   01/16/23 2243           Code Status History     Date Active Date Inactive Code Status Order ID Comments User Context   07/17/2022 0147 07/21/2022 1940 Full Code [id number]  Etta Quill, DO Inpatient   03/02/2022 1519 03/04/2022 1706 Full Code [id number]  Reubin Milan, MD Inpatient   02/04/2022 1828 02/08/2022 1753 Full Code [id number]  Marcelyn Bruins, MD Inpatient   12/27/2021 1208 01/04/2022 1620 Full Code [id number]  Emeterio Reeve, DO ED   08/02/2021 1209 08/03/2021 1632 Full Code  [id number]  Vickie Epley, MD Inpatient   01/31/2021 0528 02/02/2021 1937 Full Code [id number]  Chotiner, Yevonne Aline, MD Inpatient   01/14/2021 1953 01/15/2021 1806 Full Code [id number]  Neena Rhymes, MD Inpatient   01/05/2021 2203 01/09/2021 2155 Full Code [id number]  Elwyn Reach, MD Inpatient   12/25/2020 2107 12/28/2020 2001 Full Code [id number]  Patrecia Pour, MD Inpatient   12/24/2020 1337 12/25/2020 2107 Full Code [id number]  Eustaquio Maize, PA-C ED   12/08/2020 1432 12/10/2020 1847 Full Code [id number]  Harold Hedge, MD Inpatient   04/01/2020 2011 04/06/2020 1712 Partial Code [id number]  Dessa Phi, DO Inpatient   03/04/2020 1630 03/09/2020 1827 Full Code [id number]  Tomma Rakers, MD Inpatient   02/17/2020 0327 02/20/2020 1638 Full Code [id number]  Rise Patience, MD Inpatient   01/05/2020 0006 01/11/2020 1737 Full Code [id number]  Shela Leff, MD Inpatient   05/28/2019 2320 06/03/2019 1530 Full Code [id number]  Shela Leff, MD ED   05/03/2019 1624 05/06/2019 1852 Full Code [id number]  Velna Ochs, MD Inpatient   03/19/2019 2005 03/25/2019 1647 Full Code [id number]  Guilford Shi, MD ED      Advance Directive Documentation    Flowsheet Row Most Recent Value  Type of Advance Directive Healthcare Power of Attorney, Living will  Pre-existing out of facility DNR order (yellow form or pink MOST form) --  "MOST" Form in Place? --  IV Access:   Peripheral IV   Procedures and diagnostic studies:   DG Chest Port 1 View  Result Date: 01/16/2023 CLINICAL DATA:  Shortness of breath, history COPD, worsening symptoms since beginning of January following the flu EXAM: PORTABLE CHEST 1 VIEW COMPARISON:  Portable exam 1130 hours compared to 07/16/2022 FINDINGS: Normal heart size, mediastinal contours, and pulmonary vascularity. Atherosclerotic calcification aorta. Lungs emphysematous but clear. No pulmonary infiltrate, pleural effusion, or pneumothorax. Bones  demineralized. IMPRESSION: Emphysematous changes without acute infiltrate. Aortic Atherosclerosis (ICD10-I70.0) and Emphysema (ICD10-J43.9). Electronically Signed   By: Lavonia Dana M.D.   On: 01/16/2023 12:00     Medical Consultants:   None.   Subjective:    Paul [name] r relates his breathing is improved.  Objective:    Vitals:   01/17/23 1958 01/17/23 2148 01/18/23 0438 01/18/23 0810  BP: (!) 167/79  (!) 153/74   Pulse: 91  88   Resp: (!) 22  (!) 24   Temp: 99.1 F (37.3 C)  97.7 F (36.5 C)   TempSrc: Oral  Oral   SpO2: 98% 93% 98% 99%  Weight:      Height:       SpO2: 99 % O2 Flow Rate (L/min): 2 L/min FiO2 (%): 100 %   Intake/Output Summary (Last 24 hours) at 01/18/2023 1027 Last data filed at 01/18/2023 0900 Gross per 24 hour  Intake 790 ml  Output 900 ml  Net -110 ml    Filed Weights   01/16/23 1114  Weight: 73.5 kg    Exam: General exam: In no acute distress. Respiratory system: Good air movement and clear to auscultation. Cardiovascular system: S1 & S2 heard, RRR. No JVD. Gastrointestinal system: Abdomen is nondistended, soft and nontender.  Extremities: No pedal edema. Skin: No rashes, lesions or ulcers Psychiatry: Judgement and insight appear normal. Mood & affect appropriate.   Data Reviewed:    Labs: Basic Metabolic Panel: Recent Labs  Lab 01/16/23 1134  NA 140  K 4.4  CL 96*  CO2 36*  GLUCOSE 124*  BUN 18  CREATININE 0.77  CALCIUM 9.6    GFR Estimated Creatinine Clearance: 69.8 mL/min (by C-G formula based on SCr of 0.77 mg/dL). Liver Function Tests: No results for input(s): "AST", "ALT", "ALKPHOS", "BILITOT", "PROT", "ALBUMIN" in the last 168 hours. No results for input(s): "LIPASE", "AMYLASE" in the last 168 hours. No results for input(s): "AMMONIA" in the last 168 hours. Coagulation profile No results for input(s): "INR", "PROTIME" in the last 168 hours. COVID-19 Labs  No results for input(s): "DDIMER", "FERRITIN",  "LDH", "CRP" in the last 72 hours.  Lab Results  Component Value Date   SARSCOV2NAA NEGATIVE 01/16/2023   SARSCOV2NAA NEGATIVE 07/16/2022   [place] NEGATIVE 03/02/2022   [place] NEGATIVE 02/04/2022    CBC: Recent Labs  Lab 01/16/23 1134  WBC 16.4*  NEUTROABS 14.8*  HGB 13.1  HCT 42.6  MCV 84.7  PLT 274    Cardiac Enzymes: No results for input(s): "CKTOTAL", "CKMB", "CKMBINDEX", "TROPONINI" in the last 168 hours. BNP (last 3 results) No results for input(s): "PROBNP" in the last 8760 hours. CBG: Recent Labs  Lab 01/17/23 0756 01/17/23 1127 01/17/23 1656 01/17/23 1956 01/18/23 0927  GLUCAP 136* 167* 173* 93 152*   D-Dimer: No results for input(s): "DDIMER" in the last 72 hours. Hgb A1c: No results for input(s): "HGBA1C" in the last 72 hours. Lipid Profile: No results for input(s): "CHOL", "HDL", "LDLCALC", "TRIG", "CHOLHDL", "LDLDIRECT" in the last 72 hours.  Thyroid function studies: No results for input(s): "TSH", "T4TOTAL", "T3FREE", "THYROIDAB" in the last 72 hours.  Invalid input(s): "FREET3" Anemia work up: No results for input(s): "VITAMINB12", "FOLATE", "FERRITIN", "TIBC", "IRON", "RETICCTPCT" in the last 72 hours. Sepsis Labs: Recent Labs  Lab 01/16/23 1134  WBC 16.4*    Microbiology Recent Results (from the past 240 hour(s))  Resp panel by RT-PCR (RSV, Flu A&B, Covid) Nasopharyngeal Swab     Status: None   Collection Time: 01/16/23 11:20 AM   Specimen: Nasopharyngeal Swab; Nasal Swab  Result Value Ref Range Status   SARS Coronavirus 2 by RT PCR NEGATIVE NEGATIVE Final    Comment: (NOTE) SARS-CoV-2 target nucleic acids are NOT DETECTED.  The SARS-CoV-2 RNA is generally detectable in upper respiratory specimens during the acute phase of infection. The lowest concentration of SARS-CoV-2 viral copies this assay can detect is 138 copies/mL. A negative result does not preclude SARS-Cov-2 infection and should not be used as the sole basis  for treatment or other patient management decisions. A negative result may occur with  improper specimen collection/handling, submission of specimen other than nasopharyngeal swab, presence of viral mutation(s) within the areas targeted by this assay, and inadequate number of viral copies(<138 copies/mL). A negative result must be combined with clinical observations, patient history, and epidemiological information. The expected result is Negative.  Fact Sheet for Patients:  EntrepreneurPulse.com.au  Fact Sheet for Healthcare Providers:  IncredibleEmployment.be  This test is no t yet approved or cleared by the [place] FDA and  has been authorized for detection and/or diagnosis of SARS-CoV-2 by FDA under an Emergency Use Authorization (EUA). This EUA will remain  in effect (meaning this test can be used) for the duration of the COVID-19 declaration under Section 564(b)(1) of the Act, 21 U.S.C.section 360bbb-3(b)(1), unless the authorization is terminated  or revoked sooner.       Influenza A by PCR NEGATIVE NEGATIVE Final   Influenza B by PCR NEGATIVE NEGATIVE Final    Comment: (NOTE) The Xpert Xpress SARS-CoV-2/FLU/RSV plus assay is intended as an aid in the diagnosis of influenza from Nasopharyngeal swab specimens and should not be used as a sole basis for treatment. Nasal washings and aspirates are unacceptable for Xpert Xpress SARS-CoV-2/FLU/RSV testing.  Fact Sheet for Patients: EntrepreneurPulse.com.au  Fact Sheet for Healthcare Providers: IncredibleEmployment.be  This test is not yet approved or cleared by the [place] FDA and has been authorized for detection and/or diagnosis of SARS-CoV-2 by FDA under an Emergency Use Authorization (EUA). This EUA will remain in effect (meaning this test can be used) for the duration of the COVID-19 declaration under Section 564(b)(1) of the Act, 21  U.S.C. section 360bbb-3(b)(1), unless the authorization is terminated or revoked.     Resp Syncytial Virus by PCR NEGATIVE NEGATIVE Final    Comment: (NOTE) Fact Sheet for Patients: EntrepreneurPulse.com.au  Fact Sheet for Healthcare Providers: IncredibleEmployment.be  This test is not yet approved or cleared by the [place] FDA and has been authorized for detection and/or diagnosis of SARS-CoV-2 by FDA under an Emergency Use Authorization (EUA). This EUA will remain in effect (meaning this test can be used) for the duration of the COVID-19 declaration under Section 564(b)(1) of the Act, 21 U.S.C. section 360bbb-3(b)(1), unless the authorization is terminated or revoked.  Performed at [facility], [address] [place], [place] [address]   Expectorated Sputum Assessment w Gram Stain, Rflx to Resp Cult     Status: None  Collection Time: 01/16/23  7:51 PM   Specimen: Expectorated Sputum  Result Value Ref Range Status   Specimen Description EXPECTORATED SPUTUM  Final   Special Requests NONE  Final   Sputum evaluation   Final    THIS SPECIMEN IS ACCEPTABLE FOR SPUTUM CULTURE Performed at [facility], [place] [address], [place], [place] [address]    Report Status 01/17/2023 FINAL  Final  Culture, Respiratory w Gram Stain     Status: None (Preliminary result)   Collection Time: 01/16/23  7:51 PM  Result Value Ref Range Status   Specimen Description   Final    EXPECTORATED SPUTUM Performed at [facility] [address], [place], [place] [address]    Special Requests   Final    NONE Reflexed from [id number] Performed at [facility], [place] [address], [place], [place] [address]    Gram Stain   Final    RARE WBC PRESENT, PREDOMINANTLY PMN NO ORGANISMS SEEN Performed at [facility], [place] [address], [place], [place] [address]    Culture PENDING  Incomplete   Report  Status PENDING  Incomplete     Medications:    acidophilus  1 capsule Oral q AM   albuterol  2 puff Inhalation BID   aspirin EC  81 mg Oral Daily   atorvastatin  40 mg Oral Daily   azithromycin  500 mg Oral Daily   calcium carbonate  500 mg Oral Daily   enoxaparin (LOVENOX) injection  40 mg Subcutaneous Q24H   ferrous sulfate  325 mg Oral BID WC   fluticasone furoate-vilanterol  1 puff Inhalation Daily   And   umeclidinium bromide  1 puff Inhalation Daily   guaiFENesin  600 mg Oral BID   insulin aspart  0-5 Units Subcutaneous QHS   insulin aspart  0-9 Units Subcutaneous TID WC   insulin aspart  2 Units Subcutaneous TID WC   loratadine  10 mg Oral Daily   melatonin  5 mg Oral QHS   methylPREDNISolone (SOLU-MEDROL) injection  40 mg Intravenous Q12H   multivitamin with minerals  1 tablet Oral Daily   pantoprazole  40 mg Oral Daily   sertraline  50 mg Oral QHS   Continuous Infusions:    LOS: 1 day   Charlynne Cousins  [facility]  01/18/2023, 10:27 AM

## 2023-01-18 NOTE — Progress Notes (Addendum)
SATURATION QUALIFICATIONS: (This note is used to comply with regulatory documentation for home oxygen)  Patient Saturations on Room Air at Rest = 93%  Patient Saturations on Room Air while Ambulating = 79%  Patient Saturations on 2 Liters of oxygen while Ambulating = 92%  Please briefly explain why patient needs home oxygen: Pt O2 drops too low on room air while ambulating.

## 2023-01-19 DIAGNOSIS — I480 Paroxysmal atrial fibrillation: Secondary | ICD-10-CM

## 2023-01-19 DIAGNOSIS — J9621 Acute and chronic respiratory failure with hypoxia: Secondary | ICD-10-CM | POA: Diagnosis not present

## 2023-01-19 DIAGNOSIS — J441 Chronic obstructive pulmonary disease with (acute) exacerbation: Secondary | ICD-10-CM | POA: Diagnosis not present

## 2023-01-19 LAB — GLUCOSE, CAPILLARY
Glucose-Capillary: 83 mg/dL (ref 70–99)
Glucose-Capillary: 79 mg/dL (ref 70–99)

## 2023-01-19 MED ORDER — PREDNISONE 10 MG PO TABS: ORAL_TABLET | ORAL | 0 refills | Status: DC

## 2023-01-19 MED ORDER — AZITHROMYCIN 250 MG PO TABS: ORAL_TABLET | ORAL | 0 refills | Status: DC

## 2023-01-19 NOTE — Discharge Summary (Signed)
Physician Discharge Summary  Paul [name] [name] DOB: 1946/11/14 DOA: 01/16/2023  PCP: Paul Brome, MD  Admit date: 01/16/2023 Discharge date: 01/19/2023  Admitted From: home Disposition:  Home  Recommendations for Outpatient Follow-up:  Follow up with PCP in 1-2 weeks Please obtain BMP/CBC in one week   Home Health:No Equipment/Devices:None  Discharge Condition:Stable CODE STATUS:Full Diet recommendation: Heart Health  Brief/Interim Summary: 77 y.o. male past medical history of COPD on nocturnal 2 L of oxygen, paroxysmal atrial fibrillation not on anticoagulation, history of diabetes mellitus, chronic diastolic heart failure comes in for shortness of breath that started 3 weeks prior to admission comes in for acute COPD exacerbatio   Discharge Diagnoses:  Principal Problem:   Acute respiratory failure ([place]) Active Problems:   Acute on chronic respiratory failure with hypoxia ([place])   HLD (hyperlipidemia)   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H[facility])   Paroxysmal atrial fibrillation (H[facility])   Chronic anemia   COPD with acute exacerbation ([facility])  Acute respiratory failure with hypoxia due to COPD exacerbation: Was started on steroids antibiotics and inhalers we were able to wean him to room air but he was walk with ambulation and saturations dropped to 79%. He will go 2 L of oxygen for 2 weeks. He also continue steroid taper at home and continue azithromycin for 3 additional days.  Hyperlipidemia chronic send continue statins.  Diabetes mellitus type 2: No change made to his medication.  Paroxysmal atrial fibrillation: Rate controlled in sinus rhythm.  Discharge Instructions  Discharge Instructions     Diet - low sodium heart healthy   Complete by: As directed    Increase activity slowly   Complete by: As directed    No wound care   Complete by: As directed       Allergies as of 01/19/2023       Reactions   Tape Other (See Comments)   PLEASE  USE COBAN WRAP IN LIEU OF "TAPE," as it "pulls off the skin!!   Daliresp [roflumilast] Diarrhea, Nausea And Vomiting   Levaquin [levofloxacin] Palpitations, Other (See Comments)   Made the B/P fluctuate and heart raced   Alendronate Anxiety, Other (See Comments)   Jittery/nervous        Medication List     TAKE these medications    acetaminophen 500 MG tablet Commonly known as: TYLENOL Take 1,000 mg by mouth at bedtime as needed (pain).   ALPRAZolam 0.5 MG tablet Commonly known as: XANAX Take 1 tablet (0.5 mg total) by mouth 2 (two) times daily as needed for anxiety.   aspirin EC 81 MG tablet Take 1 tablet (81 mg total) by mouth daily. Swallow whole.   atorvastatin 40 MG tablet Commonly known as: LIPITOR TAKE 1 TABLET BY MOUTH EVERYDAY AT BEDTIME What changed: See the new instructions.   azithromycin 250 MG tablet Commonly known as: ZITHROMAX Take 1 tablet daily for 3 days Start taking on: January 20, 2023   Breztri Aerosphere 160-9-4.8 MCG/ACT Aero Generic drug: Budeson-Glycopyrrol-Formoterol Inhale 2 puffs into the lungs 2 (two) times daily.   calcium carbonate 600 MG Tabs tablet Commonly known as: OS-CAL Take 600 mg by mouth daily.   denosumab 60 MG/ML Sosy injection Commonly known as: PROLIA Inject 60 mg into the skin every 6 (six) months.   ferrous sulfate 325 (65 FE) MG tablet Take 325 mg by mouth 2 (two) times daily with a meal.   fluticasone 50 MCG/ACT nasal spray Commonly known as: FLONASE Place 2 sprays  into both nostrils daily as needed for allergies or rhinitis.   guaiFENesin 600 MG 12 hr tablet Commonly known as: MUCINEX Take 2 tablets (1,200 mg total) by mouth 2 (two) times daily. What changed: how much to take   loratadine 10 MG tablet Commonly known as: CLARITIN Take 10 mg by mouth in the morning.   Melatonin 12 MG Tabs Take 12 mg by mouth at bedtime.   multivitamin with minerals Tabs tablet Take 1 tablet by mouth daily with  breakfast.   OXYGEN Inhale 2 L/min into the lungs at bedtime.   pantoprazole 40 MG tablet Commonly known as: PROTONIX Take 1 tablet (40 mg total) by mouth 2 (two) times daily.   predniSONE 10 MG tablet Commonly known as: DELTASONE Takes  4 tablets for 1 days, then 3 tablets for 1 days, then 2 tabs for 1 days, then 1 tab for 1 days, and then stop. What changed:  medication strength See the new instructions.   ProAir HFA 108 (90 Base) MCG/ACT inhaler Generic drug: albuterol Inhale 2 puffs into the lungs as needed for wheezing or shortness of breath.   albuterol (2.5 MG/3ML) 0.083% nebulizer solution Commonly known as: PROVENTIL Take 3 mLs (2.5 mg total) by nebulization every 6 (six) hours as needed for shortness of breath or wheezing.   Probiotic Daily Caps Take 1 capsule by mouth in the morning.   sertraline 50 MG tablet Commonly known as: ZOLOFT Take 1 tablet (50 mg total) by mouth at bedtime.   vitamin C 1000 MG tablet Take 1,000 mg by mouth daily.   Vitamin D3 50 MCG (2000 UT) Tabs Take 2,000 Units by mouth in the morning.               Durable Medical Equipment  (From admission, onward)           Start     Ordered   01/19/23 0859  DME Oxygen  Once       Question Answer Comment  Length of Need 6 Months   Mode or (Route) Nasal cannula   Liters per Minute 2   Frequency Continuous (stationary and portable oxygen unit needed)   Oxygen delivery system Gas      01/19/23 0858            Allergies  Allergen Reactions   Tape Other (See Comments)    PLEASE USE COBAN WRAP IN LIEU OF "TAPE," as it "pulls off the skin!!   Daliresp [Roflumilast] Diarrhea and Nausea And Vomiting   Levaquin [Levofloxacin] Palpitations and Other (See Comments)    Made the B/P fluctuate and heart raced   Alendronate Anxiety and Other (See Comments)    Jittery/nervous    Consultations: None   Procedures/Studies: DG Chest Port 1 View  Result Date:  01/16/2023 CLINICAL DATA:  Shortness of breath, history COPD, worsening symptoms since beginning of January following the flu EXAM: PORTABLE CHEST 1 VIEW COMPARISON:  Portable exam 1130 hours compared to 07/16/2022 FINDINGS: Normal heart size, mediastinal contours, and pulmonary vascularity. Atherosclerotic calcification aorta. Lungs emphysematous but clear. No pulmonary infiltrate, pleural effusion, or pneumothorax. Bones demineralized. IMPRESSION: Emphysematous changes without acute infiltrate. Aortic Atherosclerosis (ICD10-I70.0) and Emphysema (ICD10-J43.9). Electronically Signed   By: Lavonia Dana M.D.   On: 01/16/2023 12:00   (Echo, Carotid, EGD, Colonoscopy, ERCP)    Subjective: No complaints  Discharge Exam: Vitals:   01/19/23 0518 01/19/23 0833  BP: (!) 158/73 (!) 156/62  Pulse: 86 91  Resp: 20 20  Temp: 98.1 F (36.7 C) 97.7 F (36.5 C)  SpO2: 100% 92%   Vitals:   01/18/23 2004 01/18/23 2034 01/19/23 0518 01/19/23 0833  BP:  (!) 157/58 (!) 158/73 (!) 156/62  Pulse:  94 86 91  Resp:  '20 20 20  '$ Temp:  98.9 F (37.2 C) 98.1 F (36.7 C) 97.7 F (36.5 C)  TempSrc:  Oral Oral Oral  SpO2: 99% 94% 100% 92%  Weight:      Height:        General: Pt is alert, awake, not in acute distress Cardiovascular: RRR, S1/S2 +, no rubs, no gallops Respiratory: CTA bilaterally, no wheezing, no rhonchi Abdominal: Soft, NT, ND, bowel sounds + Extremities: no edema, no cyanosis    The results of significant diagnostics from this hospitalization (including imaging, microbiology, ancillary and laboratory) are listed below for reference.     Microbiology: Recent Results (from the past 240 hour(s))  Resp panel by RT-PCR (RSV, Flu A&B, Covid) Nasopharyngeal Swab     Status: None   Collection Time: 01/16/23 11:20 AM   Specimen: Nasopharyngeal Swab; Nasal Swab  Result Value Ref Range Status   SARS Coronavirus 2 by RT PCR NEGATIVE NEGATIVE Final    Comment: (NOTE) SARS-CoV-2 target nucleic  acids are NOT DETECTED.  The SARS-CoV-2 RNA is generally detectable in upper respiratory specimens during the acute phase of infection. The lowest concentration of SARS-CoV-2 viral copies this assay can detect is 138 copies/mL. A negative result does not preclude SARS-Cov-2 infection and should not be used as the sole basis for treatment or other patient management decisions. A negative result may occur with  improper specimen collection/handling, submission of specimen other than nasopharyngeal swab, presence of viral mutation(s) within the areas targeted by this assay, and inadequate number of viral copies(<138 copies/mL). A negative result must be combined with clinical observations, patient history, and epidemiological information. The expected result is Negative.  Fact Sheet for Patients:  EntrepreneurPulse.com.au  Fact Sheet for Healthcare Providers:  IncredibleEmployment.be  This test is no t yet approved or cleared by the [place] FDA and  has been authorized for detection and/or diagnosis of SARS-CoV-2 by FDA under an Emergency Use Authorization (EUA). This EUA will remain  in effect (meaning this test can be used) for the duration of the COVID-19 declaration under Section 564(b)(1) of the Act, 21 U.S.C.section 360bbb-3(b)(1), unless the authorization is terminated  or revoked sooner.       Influenza A by PCR NEGATIVE NEGATIVE Final   Influenza B by PCR NEGATIVE NEGATIVE Final    Comment: (NOTE) The Xpert Xpress SARS-CoV-2/FLU/RSV plus assay is intended as an aid in the diagnosis of influenza from Nasopharyngeal swab specimens and should not be used as a sole basis for treatment. Nasal washings and aspirates are unacceptable for Xpert Xpress SARS-CoV-2/FLU/RSV testing.  Fact Sheet for Patients: EntrepreneurPulse.com.au  Fact Sheet for Healthcare Providers: IncredibleEmployment.be  This  test is not yet approved or cleared by the [place] FDA and has been authorized for detection and/or diagnosis of SARS-CoV-2 by FDA under an Emergency Use Authorization (EUA). This EUA will remain in effect (meaning this test can be used) for the duration of the COVID-19 declaration under Section 564(b)(1) of the Act, 21 U.S.C. section 360bbb-3(b)(1), unless the authorization is terminated or revoked.     Resp Syncytial Virus by PCR NEGATIVE NEGATIVE Final    Comment: (NOTE) Fact Sheet for Patients: EntrepreneurPulse.com.au  Fact Sheet for Healthcare Providers: IncredibleEmployment.be  This test is  not yet approved or cleared by the [place] and has been authorized for detection and/or diagnosis of SARS-CoV-2 by FDA under an Emergency Use Authorization (EUA). This EUA will remain in effect (meaning this test can be used) for the duration of the COVID-19 declaration under Section 564(b)(1) of the Act, 21 U.S.C. section 360bbb-3(b)(1), unless the authorization is terminated or revoked.  Performed at [facility], [address] [place], [place] [address]   Expectorated Sputum Assessment w Gram Stain, Rflx to Resp Cult     Status: None   Collection Time: 01/16/23  7:51 PM   Specimen: Expectorated Sputum  Result Value Ref Range Status   Specimen Description EXPECTORATED SPUTUM  Final   Special Requests NONE  Final   Sputum evaluation   Final    THIS SPECIMEN IS ACCEPTABLE FOR SPUTUM CULTURE Performed at [facility], [place] [address] [place], [place] [address]    Report Status 01/17/2023 FINAL  Final  Culture, Respiratory w Gram Stain     Status: None (Preliminary result)   Collection Time: 01/16/23  7:51 PM  Result Value Ref Range Status   Specimen Description   Final    EXPECTORATED SPUTUM Performed at [place] [address] [place], [place] [address]    Special  Requests   Final    NONE Reflexed from [id number] Performed at [facility], [place] [address] [place], [place] [address]    Gram Stain   Final    RARE WBC PRESENT, PREDOMINANTLY PMN NO ORGANISMS SEEN Performed at [facility], [place] [address], [place],  [address]    Culture PENDING  Incomplete   Report Status PENDING  Incomplete     Labs: BNP (last 3 results) Recent Labs    03/02/22 1000 07/16/22 1320 01/16/23 1134  BNP 107.5* 48.8 67.1   Basic Metabolic Panel: Recent Labs  Lab 01/16/23 1134  NA 140  K 4.4  CL 96*  CO2 36*  GLUCOSE 124*  BUN 18  CREATININE 0.77  CALCIUM 9.6   Liver Function Tests: No results for input(s): "AST", "ALT", "ALKPHOS", "BILITOT", "PROT", "ALBUMIN" in the last 168 hours. No results for input(s): "LIPASE", "AMYLASE" in the last 168 hours. No results for input(s): "AMMONIA" in the last 168 hours. CBC: Recent Labs  Lab 01/16/23 1134  WBC 16.4*  NEUTROABS 14.8*  HGB 13.1  HCT 42.6  MCV 84.7  PLT 274   Cardiac Enzymes: No results for input(s): "CKTOTAL", "CKMB", "CKMBINDEX", "TROPONINI" in the last 168 hours. BNP: Invalid input(s): "POCBNP" CBG: Recent Labs  Lab 01/17/23 1956 01/18/23 0927 01/18/23 1214 01/18/23 1621 01/19/23 0753  GLUCAP 93 152* 104* 130* 83   D-Dimer No results for input(s): "DDIMER" in the last 72 hours. Hgb A1c No results for input(s): "HGBA1C" in the last 72 hours. Lipid Profile No results for input(s): "CHOL", "HDL", "LDLCALC", "TRIG", "CHOLHDL", "LDLDIRECT" in the last 72 hours. Thyroid function studies No results for input(s): "TSH", "T4TOTAL", "T3FREE", "THYROIDAB" in the last 72 hours.  Invalid input(s): "FREET3" Anemia work up No results for input(s): "VITAMINB12", "FOLATE", "FERRITIN", "TIBC", "IRON", "RETICCTPCT" in the last 72 hours. Urinalysis    Component Value Date/Time   COLORURINE YELLOW 01/14/2021 1100   APPEARANCEUR CLEAR 01/14/2021 1100   LABSPEC  1.015 01/14/2021 1100   PHURINE 7.5 01/14/2021 1100   GLUCOSEU NEGATIVE 01/14/2021 1100   HGBUR MODERATE (A) 01/14/2021 1100   BILIRUBINUR Negative 05/10/2021 0931  KETONESUR NEGATIVE 01/14/2021 1100   PROTEINUR Positive (A) 05/10/2021 0931   PROTEINUR NEGATIVE 01/14/2021 1100   UROBILINOGEN negative (A) 05/10/2021 0931   NITRITE Negative 05/10/2021 0931   NITRITE NEGATIVE 01/14/2021 1100   LEUKOCYTESUR Negative 05/10/2021 0931   LEUKOCYTESUR NEGATIVE 01/14/2021 1100   Sepsis Labs Recent Labs  Lab 01/16/23 1134  WBC 16.4*   Microbiology Recent Results (from the past 240 hour(s))  Resp panel by RT-PCR (RSV, Flu A&B, Covid) Nasopharyngeal Swab     Status: None   Collection Time: 01/16/23 11:20 AM   Specimen: Nasopharyngeal Swab; Nasal Swab  Result Value Ref Range Status   SARS Coronavirus 2 by RT PCR NEGATIVE NEGATIVE Final    Comment: (NOTE) SARS-CoV-2 target nucleic acids are NOT DETECTED.  The SARS-CoV-2 RNA is generally detectable in upper respiratory specimens during the acute phase of infection. The lowest concentration of SARS-CoV-2 viral copies this assay can detect is 138 copies/mL. A negative result does not preclude SARS-Cov-2 infection and should not be used as the sole basis for treatment or other patient management decisions. A negative result may occur with  improper specimen collection/handling, submission of specimen other than nasopharyngeal swab, presence of viral mutation(s) within the areas targeted by this assay, and inadequate number of viral copies(<138 copies/mL). A negative result must be combined with clinical observations, patient history, and epidemiological information. The expected result is Negative.  Fact Sheet for Patients:  EntrepreneurPulse.com.au  Fact Sheet for Healthcare Providers:  IncredibleEmployment.be  This test is no t yet approved or cleared by the [place] FDA and  has been  authorized for detection and/or diagnosis of SARS-CoV-2 by FDA under an Emergency Use Authorization (EUA). This EUA will remain  in effect (meaning this test can be used) for the duration of the COVID-19 declaration under Section 564(b)(1) of the Act, 21 U.S.C.section 360bbb-3(b)(1), unless the authorization is terminated  or revoked sooner.       Influenza A by PCR NEGATIVE NEGATIVE Final   Influenza B by PCR NEGATIVE NEGATIVE Final    Comment: (NOTE) The Xpert Xpress SARS-CoV-2/FLU/RSV plus assay is intended as an aid in the diagnosis of influenza from Nasopharyngeal swab specimens and should not be used as a sole basis for treatment. Nasal washings and aspirates are unacceptable for Xpert Xpress SARS-CoV-2/FLU/RSV testing.  Fact Sheet for Patients: EntrepreneurPulse.com.au  Fact Sheet for Healthcare Providers: IncredibleEmployment.be  This test is not yet approved or cleared by the [place] FDA and has been authorized for detection and/or diagnosis of SARS-CoV-2 by FDA under an Emergency Use Authorization (EUA). This EUA will remain in effect (meaning this test can be used) for the duration of the COVID-19 declaration under Section 564(b)(1) of the Act, 21 U.S.C. section 360bbb-3(b)(1), unless the authorization is terminated or revoked.     Resp Syncytial Virus by PCR NEGATIVE NEGATIVE Final    Comment: (NOTE) Fact Sheet for Patients: EntrepreneurPulse.com.au  Fact Sheet for Healthcare Providers: IncredibleEmployment.be  This test is not yet approved or cleared by the [place] FDA and has been authorized for detection and/or diagnosis of SARS-CoV-2 by FDA under an Emergency Use Authorization (EUA). This EUA will remain in effect (meaning this test can be used) for the duration of the COVID-19 declaration under Section 564(b)(1) of the Act, 21 U.S.C. section 360bbb-3(b)(1), unless the  authorization is terminated or revoked.  Performed at [facility], [address] [place], [place] [address]   Expectorated Sputum Assessment w Gram Stain, Rflx  to Resp Cult     Status: None   Collection Time: 01/16/23  7:51 PM   Specimen: Expectorated Sputum  Result Value Ref Range Status   Specimen Description EXPECTORATED SPUTUM  Final   Special Requests NONE  Final   Sputum evaluation   Final    THIS SPECIMEN IS ACCEPTABLE FOR SPUTUM CULTURE Performed at [facility], [place] [address] [place], [place] [address]    Report Status 01/17/2023 FINAL  Final  Culture, Respiratory w Gram Stain     Status: None (Preliminary result)   Collection Time: 01/16/23  7:51 PM  Result Value Ref Range Status   Specimen Description   Final    EXPECTORATED SPUTUM Performed at [place] [address] [place], [place] [address]    Special Requests   Final    NONE Reflexed from [id number] Performed at [facility], [place] [address] [place], [place] [address]    Gram Stain   Final    RARE WBC PRESENT, PREDOMINANTLY PMN NO ORGANISMS SEEN Performed at [facility], [place] [address] [place], [place] [address]    Culture PENDING  Incomplete   Report Status PENDING  Incomplete     Time coordinating discharge: Over 30 minutes  SIGNED:   Charlynne Cousins, MD  [facility] 01/19/2023, 8:58 AM Pager   If 7PM-7AM, please contact night-coverage [email or web address] Password [email or web address]

## 2023-01-19 NOTE — Progress Notes (Signed)
Patient discharged: Home with family  Via: Wheelchair   Discharge paperwork given: to patient and family  Reviewed with teach back  IV and telemetry disconnected  Belongings given to patient  Pt has his O2 at home, verified with CM and patient prior to d/c.

## 2023-01-20 ENCOUNTER — Telehealth: Payer: Self-pay | Admitting: *Deleted

## 2023-01-20 ENCOUNTER — Encounter: Payer: Self-pay | Admitting: *Deleted

## 2023-01-20 LAB — CULTURE, RESPIRATORY W GRAM STAIN: Culture: NORMAL

## 2023-01-20 NOTE — Patient Outreach (Signed)
Care Coordination [facility] Note Transition Care Management Follow-up Telephone Call Date of discharge and from where: Sunday 01/19/23; [facility]; Acute respiratory failure/ COPD exacerbation How have you been since you were released from the hospital? "I am doing just fine; no problems at all.  Using my oxygen extra like they told me to; taking the antibiotic and the prednisone like they told me to- nothing else changed.  I don't need to have anyone to call me and check on me-- I am doing fine, my wife is here helping me if I need anything, and I have a palliative Care nurse that comes to my home every couple of weeks to check on me too." Any questions or concerns? No  Items Reviewed: Did the pt receive and understand the discharge instructions provided? Yes  Medications obtained and verified? Yes  confirmed patient obtained/ is taking newly prescribed antibiotic/ prednisone, post- recent hospitalization; he declines further review of medications and denies questions/ concerns around medications; reports he is aware of all medications but his wife primarily prepares/ manages medications Other? No  Any new allergies since your discharge? No  Dietary orders reviewed? No Do you have support at home? Yes  reports he is independent in self-care activities;   Home Care and Equipment/Supplies: Were home health services ordered? no If so, what is the name of the agency? N/A  Has the agency set up a time to come to the patient's home? not applicable Were any new equipment or medical supplies ordered?  No What is the name of the medical supply agency? N/A Were you able to get the supplies/equipment? not applicable Do you have any questions related to the use of the equipment or supplies? No N/A  Functional Questionnaire: (I = Independent and D = Dependent) ADLs: I  Bathing/Dressing- I  Meal Prep- I  Eating- I  Maintaining continence- I  Transferring/Ambulation- I  Managing Meds- D  reports  spouse manages all aspects of medication administration  Follow up appointments reviewed:  PCP Hospital f/u appt confirmed? No  Scheduled to see - on - @ - patient insists he already has a scheduled appointment for labs/ office visit with PCP for hospital follow up; I was not able to verify this, and will send to [place] pool to facilitate/ ensure [facility] f/u appt confirmed? Yes  Scheduled to see pulmonology provider on Monday 02/10/23 @ 8:45 am Are transportation arrangements needed? No  If their condition worsens, is the pt aware to call PCP or go to the Emergency Dept.? Yes Was the patient provided with contact information for the PCP's office or ED? No- declined; reports already has contact information for all care providers Was to pt encouraged to call back with questions or concerns? Yes  SDOH assessments and interventions completed:   Yes SDOH Interventions Today    Flowsheet Row Most Recent Value  SDOH Interventions   Food Insecurity Interventions Intervention Not Indicated  Transportation Interventions Intervention Not Indicated      Interventions Today    Flowsheet Row Most Recent Value  Chronic Disease Discussed/Reviewed   Chronic disease discussed/reviewed during today's visit Chronic Obstructive Pulmonary Disease (COPD)  General Adult Interventions   General Interventions Discussed/Reviewed General Interventions Reviewed, Durable Medical Equipment (DME)  Durable Medical Equipment (DME) Oxygen  [confirmed patient has home O2 and is using as prescribed post- hospital discharge]  Education Interventions   Education Provided Provided Verbal Education  Provided Verbal Education On Labs  [need to have lab work completed  within one week of hospital discharge]  Pharmacy Interventions   Pharmacy Dicussed/Reviewed Pharmacy Topics Reviewed, Medication Adherence  [declines medication review,  confirms he obtained and istaking post-hospital discharge medications for  antibiotics and prednisone,  denies other questions/ concerns around medications]      Care Coordination Interventions:  PCP follow up appointment requested Interventions provided as above    Encounter Outcome:  Pt. Visit Completed    Oneta Rack, RN, BSN, CCRN Alumnus RN CM Care Coordination/ Transition of [place] Management [phone]: direct office

## 2023-01-21 ENCOUNTER — Ambulatory Visit: Payer: Medicare Other | Admitting: Cardiology

## 2023-01-22 ENCOUNTER — Encounter: Payer: Self-pay | Admitting: Family Medicine

## 2023-01-22 ENCOUNTER — Ambulatory Visit (INDEPENDENT_AMBULATORY_CARE_PROVIDER_SITE_OTHER): Payer: Medicare Other | Admitting: Family Medicine

## 2023-01-22 VITALS — BP 154/72 | HR 99 | Temp 97.5°F | Wt 167.0 lb

## 2023-01-22 DIAGNOSIS — J9621 Acute and chronic respiratory failure with hypoxia: Secondary | ICD-10-CM

## 2023-01-22 DIAGNOSIS — J441 Chronic obstructive pulmonary disease with (acute) exacerbation: Secondary | ICD-10-CM | POA: Diagnosis not present

## 2023-01-22 NOTE — Progress Notes (Signed)
Subjective:  Patient ID: Paul [name], male    DOB: [birth year]  Age: 77 y.o. MRN: [id number]  Chief Complaint  Patient presents with   Hospitalization Follow-up    Follow up Hospitalization  Patient was admitted to [facility] on 01/16/2023 and discharged on 01/19/2023. He was treated for COPD exacerbation, acute on chronic respiratory failure with hypoxia.   CXR: Emphysema. Aortic atherosclerosis. No pneumonia. Given IV ABX and Steroids.  Treatment for this included ZPACK.  Patient is tapering prednisone. Down to 25 mg daily.   He reports excellent compliance with treatment. He reports this condition is improved.   Continuing to wear 2 L Oxygen.      11/22/2022    9:35 AM 08/16/2022    8:20 AM 02/15/2022    8:33 AM 01/14/2022   11:14 AM 12/11/2021    1:11 PM  Depression screen PHQ 2/9  Decreased Interest 0 0 0 0 0  Down, Depressed, Hopeless 0 0 0 0 0  PHQ - 2 Score 0 0 0 0 0         07/20/2022    8:00 AM 07/20/2022   11:00 PM 07/21/2022   10:00 AM 08/16/2022    8:20 AM 11/22/2022    9:34 AM  Fall Risk  Falls in the past year?    1 0  Was there an injury with Fall?    0 0  Fall Risk Category Calculator    1 0  Fall Risk Category (Retired)    Low Low  (RETIRED) Patient Fall Risk Level Moderate fall risk Moderate fall risk High fall risk Low fall risk Low fall risk  Patient at Risk for Falls Due to    History of fall(s) History of fall(s)  Fall risk Follow up    Falls evaluation completed Falls evaluation completed      Review of Systems  Constitutional:  Negative for appetite change, fatigue and fever.  HENT:  Positive for rhinorrhea. Negative for congestion, ear pain, sinus pressure and sore throat.   Respiratory:  Positive for shortness of breath (Chronic COPD). Negative for cough, chest tightness and wheezing.   Cardiovascular:  Negative for chest pain and palpitations.  Gastrointestinal:  Negative for abdominal pain, constipation, diarrhea, nausea and  vomiting.  Genitourinary:  Negative for dysuria and hematuria.  Musculoskeletal:  Negative for arthralgias, back pain, joint swelling and myalgias.  Skin:  Negative for rash.  Neurological:  Positive for weakness. Negative for dizziness and headaches.  Psychiatric/Behavioral:  Negative for dysphoric mood. The patient is not nervous/anxious.     No current facility-administered medications on file prior to visit.   Current Outpatient Medications on File Prior to Visit  Medication Sig Dispense Refill   acetaminophen (TYLENOL) 500 MG tablet Take 1,000 mg by mouth at bedtime as needed (pain).     albuterol (PROAIR HFA) 108 (90 Base) MCG/ACT inhaler Inhale 2 puffs into the lungs as needed for wheezing or shortness of breath.     albuterol (PROVENTIL) (2.5 MG/3ML) 0.083% nebulizer solution Take 3 mLs (2.5 mg total) by nebulization every 6 (six) hours as needed for shortness of breath or wheezing. 360 mL 5   ALPRAZolam (XANAX) 0.5 MG tablet Take 1 tablet (0.5 mg total) by mouth 2 (two) times daily as needed for anxiety. 30 tablet 0   Ascorbic Acid (VITAMIN C) 1000 MG tablet Take 1,000 mg by mouth daily.     aspirin EC 81 MG tablet Take 1 tablet (81 mg total)  by mouth daily. Swallow whole. 90 tablet 3   atorvastatin (LIPITOR) 40 MG tablet TAKE 1 TABLET BY MOUTH EVERYDAY AT BEDTIME (Patient taking differently: Take 40 mg by mouth at bedtime.) 90 tablet 0   Budeson-Glycopyrrol-Formoterol (BREZTRI AEROSPHERE) 160-9-4.8 MCG/ACT AERO Inhale 2 puffs into the lungs 2 (two) times daily. 3 each 3   calcium carbonate (OS-CAL) 600 MG TABS tablet Take 600 mg by mouth daily.     Cholecalciferol (VITAMIN D3) 50 MCG (2000 UT) TABS Take 2,000 Units by mouth in the morning.     denosumab (PROLIA) 60 MG/ML SOSY injection Inject 60 mg into the skin every 6 (six) months.     ferrous sulfate 325 (65 FE) MG tablet Take 325 mg by mouth 2 (two) times daily with a meal.     fluticasone (FLONASE) 50 MCG/ACT nasal spray Place  2 sprays into both nostrils daily as needed for allergies or rhinitis.     guaiFENesin (MUCINEX) 600 MG 12 hr tablet Take 2 tablets (1,200 mg total) by mouth 2 (two) times daily. (Patient taking differently: Take 600 mg by mouth 2 (two) times daily.) 60 tablet 0   loratadine (CLARITIN) 10 MG tablet Take 10 mg by mouth in the morning.     Melatonin 12 MG TABS Take 12 mg by mouth at bedtime.     Multiple Vitamin (MULTIVITAMIN WITH MINERALS) TABS tablet Take 1 tablet by mouth daily with breakfast.     OXYGEN Inhale 2 L/min into the lungs at bedtime.     pantoprazole (PROTONIX) 40 MG tablet Take 1 tablet (40 mg total) by mouth 2 (two) times daily. 180 tablet 3   predniSONE (DELTASONE) 10 MG tablet Takes  4 tablets for 1 days, then 3 tablets for 1 days, then 2 tabs for 1 days, then 1 tab for 1 days, and then stop. (Patient taking differently: Take 10-40 mg by mouth See admin instructions. Take 40 mg (4 tablets) by mouth for one day, then 30 mg (3 tablets) for one day, then 20 mg (2 tablets) for one day, then 10 mg (1 tablet) for one day, then stop.) 10 tablet 0   Probiotic Product (PROBIOTIC DAILY) CAPS Take 1 capsule by mouth in the morning.     sertraline (ZOLOFT) 50 MG tablet Take 1 tablet (50 mg total) by mouth at bedtime. 90 tablet 1   Past Medical History:  Diagnosis Date   Anemia    Aortic atherosclerosis (HCC)    Aspiration pneumonia due to inhalation of vomitus ([place]) 02/03/2022   Atrial fibrillation (HCC)    Back pain    COPD (chronic obstructive pulmonary disease) (HCC)    COPD exacerbation ([place]) 12/27/2021   COVID-19 12/24/2020   Diabetes mellitus without complication (HCC)    Dizziness 04/08/2022   Elevated PSA    GAD (generalized anxiety disorder)    GERD (gastroesophageal reflux disease)    High cholesterol    Hypertension    Lingular pneumonia 01/05/2021   See cxr 01/06/20 rx with zpak/ cefipime x 5 days and then developed purulent sputum again 01/12/21 so rx levcaquin 500 mg  daily x 7 days then return for cxr before more    Lung nodule < 6cm on CT 05/29/2016   Osteoporosis    Oxygen deficiency    Pneumonia    January 2022   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 Quentin Ore with a 20 mm Watchman FLX device   RSV bronchitis  01/27/2022   Vertebral compression fracture ([place]) 05/29/2019   T8 compression fracture noted on CT scan 05/28/2019 inpatient with chronic steroid dependent COPD - rx calcitonin nasal spray rx per PCP    Past Surgical History:  Procedure Laterality Date   CATARACT EXTRACTION Right    ELBOW SURGERY  07/2022   surgery on olecranon bursa. Dr. Greta Doom   IR KYPHO EA ADDL LEVEL THORACIC OR LUMBAR  11/28/2021   LEFT ATRIAL APPENDAGE OCCLUSION N/A 08/02/2021   Procedure: LEFT ATRIAL APPENDAGE OCCLUSION;  Surgeon: Sherren Mocha, MD;  Location: [place] [facility];  Service: Cardiovascular;  Laterality: N/A;   SKIN SURGERY     shoulders and chest   TEE WITHOUT CARDIOVERSION N/A 08/02/2021   Procedure: TRANSESOPHAGEAL ECHOCARDIOGRAM (TEE);  Surgeon: Sherren Mocha, MD;  Location: [place] [facility];  Service: Cardiovascular;  Laterality: N/A;   TEE WITHOUT CARDIOVERSION N/A 09/13/2021   Procedure: TRANSESOPHAGEAL ECHOCARDIOGRAM (TEE);  Surgeon: Sanda Klein, MD;  Location: [facility];  Service: Cardiovascular;  Laterality: N/A;    Family History  Problem Relation Age of Onset   Heart disease Brother    Heart disease Mother    Heart disease Father    Cancer Paternal Uncle    Social History   Socioeconomic History   Marital status: Married    Spouse name: Not on file   Number of children: 3   Years of education: Not on file   Highest education level: Not on file  Occupational History   Occupation: retired    Comment: truck driver  Tobacco Use   Smoking status: Former    Packs/day: 2.00    Years: 52.00    Total pack years: 104.00    Types: Cigarettes    Quit date: 02/03/2009    Years  since quitting: 13.9   Smokeless tobacco: Never   Tobacco comments:    Counseled to remain smoke free  Vaping Use   Vaping Use: Never used  Substance and Sexual Activity   Alcohol use: Not Currently    Alcohol/week: 0.0 standard drinks of alcohol    Comment: occ   Drug use: No   Sexual activity: Not on file  Other Topics Concern   Not on file  Social History Narrative   Originally from [place]. Previously worked driving a Restaurant manager, fast food. No pets currently. No mold exposure. 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       wears sunscreen, brushes and flosses daily, see's dentist bi-annually, has smoke/carbon monoxide detectors, wears a seatbelt and practices gun safety      Social Determinants of Health   Financial Resource Strain: Medium Risk (10/31/2022)   Overall Financial Resource Strain (CARDIA)    Difficulty of Paying Living Expenses: Somewhat hard  Food Insecurity: No Food Insecurity (01/25/2023)   Hunger Vital Sign    Worried About Running Out of Food in the Last Year: Never true    Ran Out of Food in the Last Year: Never true  Transportation Needs: No Transportation Needs (01/25/2023)   PRAPARE - Hydrologist (Medical): No    Lack of Transportation (Non-Medical): No  Physical Activity: Insufficiently Active (02/15/2022)   Exercise Vital Sign    Days of Exercise per Week: 7 days    Minutes of Exercise per Session: 10 min  Stress: No Stress Concern Present (02/15/2022)   [place]  Feeling of Stress : Only a little  Social Connections: Socially Integrated (02/15/2022)   Social Connection and Isolation Panel [NHANES]    Frequency of Communication with Friends and Family: More than three times a week    Frequency of Social Gatherings with Friends and Family: More than three times a week    Attends Religious Services: More  than 4 times per year    Active Member of [employer] or Organizations: Yes    Attends Music therapist: More than 4 times per year    Marital Status: Married    Objective:  BP (!) 154/72 (BP Location: Left Arm, Patient Position: Sitting)   Pulse 99   Temp (!) 97.5 F (36.4 C) (Temporal)   Wt 167 lb (75.8 kg)   SpO2 99%   BMI 26.95 kg/m      01/26/2023    7:55 AM 01/26/2023    3:36 AM 01/25/2023    9:04 PM  BP/Weight  Systolic BP 010 272 536  Diastolic BP 65 63 68  Wt. (Lbs)   161.6  BMI   26.08 kg/m2    Physical Exam Vitals reviewed.  Constitutional:      Appearance: Normal appearance. He is normal weight.  Neck:     Vascular: No carotid bruit.  Cardiovascular:     Rate and Rhythm: Normal rate and regular rhythm.     Heart sounds: Normal heart sounds.  Pulmonary:     Effort: Pulmonary effort is normal.     Breath sounds: Wheezing (mild) present.  Abdominal:     General: Abdomen is flat. Bowel sounds are normal.     Palpations: Abdomen is soft.     Tenderness: There is no abdominal tenderness.  Neurological:     Mental Status: He is alert and oriented to person, place, and time.  Psychiatric:        Mood and Affect: Mood normal.        Behavior: Behavior normal.     Diabetic Foot Exam - Simple   No data filed      Lab Results  Component Value Date   WBC 11.8 (H) 01/26/2023   HGB 11.6 (L) 01/26/2023   HCT 37.7 (L) 01/26/2023   PLT 224 01/26/2023   GLUCOSE 241 (H) 01/26/2023   CHOL 189 11/18/2022   TRIG 138 11/18/2022   HDL 72 11/18/2022   LDLCALC 93 11/18/2022   ALT 43 01/26/2023   AST 32 01/26/2023   NA 136 01/26/2023   K 4.4 01/26/2023   CL 95 (L) 01/26/2023   CREATININE 0.91 01/26/2023   BUN 18 01/26/2023   CO2 29 01/26/2023   TSH 0.792 01/25/2023   INR 0.9 01/25/2023   HGBA1C 6.3 (H) 11/18/2022   MICROALBUR Negative 10/09/2021      Assessment & Plan:    COPD with acute exacerbation ([place]) Assessment & Plan: Improved.   Continue to wean prednisone.  Antibiotic completed.    Orders: -     CBC with Differential/Platelet -     Comprehensive metabolic panel  Acute on chronic respiratory failure with hypoxia (HCC) Assessment & Plan: Improved. Continue oxygen 2 L daily.      No orders of the defined types were placed in this encounter.   Orders Placed This Encounter  Procedures   CBC with Differential/Platelet   Comprehensive metabolic panel     Follow-up: Return in about 9 weeks (around 03/26/2023) for chronic fasting.  An After Visit Summary was printed and given to  the patient.   I,Lauren M Auman,acting as a scribe for [name], MD.,have documented all relevant documentation on the behalf of [name], MD,as directed by  [name], MD while in the presence of [name], MD.    [name], MD [place] [phone]

## 2023-01-23 LAB — COMPREHENSIVE METABOLIC PANEL
ALT: 56 IU/L — ABNORMAL HIGH (ref 0–44)
AST: 31 IU/L (ref 0–40)
Albumin/Globulin Ratio: 2 (ref 1.2–2.2)
Albumin: 4.4 g/dL (ref 3.8–4.8)
Alkaline Phosphatase: 83 IU/L (ref 44–121)
BUN/Creatinine Ratio: 19 (ref 10–24)
BUN: 14 mg/dL (ref 8–27)
Bilirubin Total: 0.3 mg/dL (ref 0.0–1.2)
CO2: 29 mmol/L (ref 20–29)
Calcium: 9.6 mg/dL (ref 8.6–10.2)
Chloride: 93 mmol/L — ABNORMAL LOW (ref 96–106)
Creatinine, Ser: 0.72 mg/dL — ABNORMAL LOW (ref 0.76–1.27)
Globulin, Total: 2.2 g/dL (ref 1.5–4.5)
Glucose: 153 mg/dL — ABNORMAL HIGH (ref 70–99)
Potassium: 5.7 mmol/L — ABNORMAL HIGH (ref 3.5–5.2)
Sodium: 138 mmol/L (ref 134–144)
Total Protein: 6.6 g/dL (ref 6.0–8.5)
eGFR: 94 mL/min/{1.73_m2} (ref >59)

## 2023-01-23 LAB — CBC WITH DIFFERENTIAL/PLATELET
Basophils Absolute: 0 10*3/uL (ref 0.0–0.2)
Basos: 0 %
EOS (ABSOLUTE): 0 10*3/uL (ref 0.0–0.4)
Eos: 0 %
Hematocrit: 42.5 % (ref 37.5–51.0)
Hemoglobin: 13.4 g/dL (ref 13.0–17.7)
Immature Grans (Abs): 0.1 10*3/uL (ref 0.0–0.1)
Immature Granulocytes: 1 %
Lymphocytes Absolute: 0.3 10*3/uL — ABNORMAL LOW (ref 0.7–3.1)
Lymphs: 2 %
MCH: 25.9 pg — ABNORMAL LOW (ref 26.6–33.0)
MCHC: 31.5 g/dL (ref 31.5–35.7)
MCV: 82 fL (ref 79–97)
Monocytes Absolute: 0.4 10*3/uL (ref 0.1–0.9)
Monocytes: 3 %
Neutrophils Absolute: 13.6 10*3/uL — ABNORMAL HIGH (ref 1.4–7.0)
Neutrophils: 94 %
Platelets: 268 10*3/uL (ref 150–450)
RBC: 5.18 x10E6/uL (ref 4.14–5.80)
RDW: 13.9 % (ref 11.6–15.4)
WBC: 14.4 10*3/uL — ABNORMAL HIGH (ref 3.4–10.8)

## 2023-01-23 NOTE — Progress Notes (Signed)
Blood count abnormal.  WBC is elevated but actually decreased.  Patient is on steroids. Liver function normal.  Kidney function normal.  Potassium was too high.  5.7.  Recommend come for stat potassium level Friday.

## 2023-01-24 ENCOUNTER — Other Ambulatory Visit: Payer: Self-pay

## 2023-01-24 ENCOUNTER — Telehealth: Payer: Self-pay

## 2023-01-24 DIAGNOSIS — E875 Hyperkalemia: Secondary | ICD-10-CM | POA: Diagnosis not present

## 2023-01-24 NOTE — Progress Notes (Signed)
Care Management & Coordination Services Pharmacy Team  Reason for Encounter: Hypertension  Contacted patient to discuss hypertension disease state. Spoke with patient on 02/03/2023     Current antihypertensive regimen:  Diet and Exercise Aspirin 75m daily  Patient verbally confirms he is taking the above medications as directed. Yes  How often are you checking your Blood Pressure? daily  he checks his blood pressure in the morning before taking his medication.  Current home BP readings:  01/24/23 142/73 96 01/25/23 136/103 85 (Went to hospital this day for Afib) 02/01/23 136/72 82  02/02/23 140/85 82  Wrist or arm cuff:Arm  Caffeine intake:None  Salt intake:Limited  OTC medications including pseudoephedrine or NSAIDs? Tylenol prn   Any readings above 180/100? No  What recent interventions/DTPs have been made by any provider to improve Blood Pressure control since last CPP Visit: No recent changes  Any recent hospitalizations or ED visits since last visit with CPP? Yes, he was seen for COPD and Afib  What diet changes have been made to improve Blood Pressure Control?  No diet changes   What exercise is being done to improve your Blood Pressure Control?  Pt goes to the gym 3 days a week    Adherence Review: Is the patient currently on ACE/ARB medication? No Does the patient have >5 day gap between last estimated fill dates? No  Star Rating Drugs:  Medication:  Last Fill: Day Supply None noted  Chart Updates: Recent office visits:  01/22/23 CRochel BromeMD. Seen for COPD. No med changes.   01/13/23 MPhilipp OvensCMA. Telephone Call. Increase prednisone to 40 mg for the next two days   01/06/23 CRochel BromeMD. Seen for Acute Bronchitis with COPD. Started on Augmentin and Prednisone 20 mg 3 tablets daily for 3 days, then 2 tablets daily for 3 days, then 1 tablet daily for 3 days then return to 10 mg daily. Increase albuterol nebulizers to every 4-6 hours.  Recent consult  visits:  None  Hospital visits:  #1 Medication Reconciliation was completed by comparing discharge summary, patient's EMR and Pharmacy list, and upon discussion with patient.  Admitted to the hospital on 01/25/23 due to Afib. Discharge date was 01/28/23. [facility] -[facility] Doxy[facility] 567m -All other medications will remain the same.    #2 Medication Reconciliation was completed by comparing discharge summary, patient's EMR and Pharmacy list, and upon discussion with patient.  Admitted to the hospital on 01/16/23 due to COPD. Discharge date was 01/19/23. [facility] -[facility] Azithromycin due to COPD Exacerbation   -All other medications will remain the same.    Medications: Outpatient Encounter Medications as of 01/24/2023  Medication Sig   acetaminophen (TYLENOL) 500 MG tablet Take 1,000 mg by mouth at bedtime as needed (pain).   albuterol (PROAIR HFA) 108 (90 Base) MCG/ACT inhaler Inhale 2 puffs into the lungs as needed for wheezing or shortness of breath.   albuterol (PROVENTIL) (2.5 MG/3ML) 0.083% nebulizer solution Take 3 mLs (2.5 mg total) by nebulization every 6 (six) hours as needed for shortness of breath or wheezing.   ALPRAZolam (XANAX) 0.5 MG tablet Take 1 tablet (0.5 mg total) by mouth 2 (two) times daily as needed for anxiety.   Ascorbic Acid (VITAMIN C) 1000 MG tablet Take 1,000 mg by mouth daily.   aspirin EC 81 MG tablet Take 1 tablet (81 mg total)  by mouth daily. Swallow whole.   atorvastatin (LIPITOR) 40 MG tablet TAKE 1 TABLET BY MOUTH EVERYDAY AT BEDTIME (Patient taking differently: Take 40 mg by mouth at bedtime.)   Budeson-Glycopyrrol-Formoterol (BREZTRI AEROSPHERE) 160-9-4.8 MCG/ACT AERO Inhale 2 puffs into the lungs 2 (two) times daily.   calcium carbonate (OS-CAL) 600 MG TABS tablet Take 600 mg by mouth daily.    Cholecalciferol (VITAMIN D3) 50 MCG (2000 UT) TABS Take 2,000 Units by mouth in the morning.   denosumab (PROLIA) 60 MG/ML SOSY injection Inject 60 mg into the skin every 6 (six) months.   ferrous sulfate 325 (65 FE) MG tablet Take 325 mg by mouth 2 (two) times daily with a meal.   fluticasone (FLONASE) 50 MCG/ACT nasal spray Place 2 sprays into both nostrils daily as needed for allergies or rhinitis.   guaiFENesin (MUCINEX) 600 MG 12 hr tablet Take 2 tablets (1,200 mg total) by mouth 2 (two) times daily. (Patient taking differently: Take 600 mg by mouth 2 (two) times daily.)   loratadine (CLARITIN) 10 MG tablet Take 10 mg by mouth in the morning.   Melatonin 12 MG TABS Take 12 mg by mouth at bedtime.   Multiple Vitamin (MULTIVITAMIN WITH MINERALS) TABS tablet Take 1 tablet by mouth daily with breakfast.   OXYGEN Inhale 2 L/min into the lungs at bedtime.   pantoprazole (PROTONIX) 40 MG tablet Take 1 tablet (40 mg total) by mouth 2 (two) times daily.   predniSONE (DELTASONE) 10 MG tablet Takes  4 tablets for 1 days, then 3 tablets for 1 days, then 2 tabs for 1 days, then 1 tab for 1 days, and then stop.   Probiotic Product (PROBIOTIC DAILY) CAPS Take 1 capsule by mouth in the morning.   sertraline (ZOLOFT) 50 MG tablet Take 1 tablet (50 mg total) by mouth at bedtime.   No facility-administered encounter medications on file as of 01/24/2023.    Recent Office Vitals: BP Readings from Last 3 Encounters:  01/22/23 (!) 154/72  01/19/23 (!) 147/60  01/12/23 130/76   Pulse Readings from Last 3 Encounters:  01/22/23 99  01/19/23 92  01/12/23 90    Wt Readings from Last 3 Encounters:  01/22/23 167 lb (75.8 kg)  01/16/23 162 lb (73.5 kg)  01/12/23 162 lb (73.5 kg)     Kidney Function Lab Results  Component Value Date/Time   CREATININE 0.72 (L) 01/22/2023 02:04 PM   CREATININE 0.77 01/16/2023 11:34 AM   GFRNONAA >60 01/16/2023 11:34 AM   GFRAA 107 11/20/2020 12:02 PM       Latest Ref  Rng & Units 01/22/2023    2:04 PM 01/16/2023   11:34 AM 11/18/2022   10:26 AM  BMP  Glucose 70 - 99 mg/dL 153  124  99   BUN 8 - 27 mg/dL 14  18  12   $ Creatinine 0.76 - 1.27 mg/dL 0.72  0.77  0.95   BUN/Creat Ratio 10 - 24 19   13   $ Sodium 134 - 144 mmol/L 138  140  144   Potassium 3.5 - 5.2 mmol/L 5.7  4.4  4.3   Chloride 96 - 106 mmol/L 93  96  98   CO2 20 - 29 mmol/L 29  36  28   Calcium 8.6 - 10.2 mg/dL 9.6  9.6  9.6      [name], [facility] Clinical Pharmacist Assistant  [phone]

## 2023-01-25 ENCOUNTER — Inpatient Hospital Stay (HOSPITAL_COMMUNITY)
Admission: EM | Admit: 2023-01-25 | Discharge: 2023-01-28 | DRG: 309 | Disposition: A | Discharge status: Home / Self Care | Payer: Medicare Other | Attending: Internal Medicine | Admitting: Internal Medicine

## 2023-01-25 ENCOUNTER — Inpatient Hospital Stay (HOSPITAL_COMMUNITY): Payer: Medicare Other

## 2023-01-25 ENCOUNTER — Other Ambulatory Visit: Payer: Self-pay

## 2023-01-25 ENCOUNTER — Encounter (HOSPITAL_COMMUNITY): Payer: Self-pay | Admitting: Internal Medicine

## 2023-01-25 ENCOUNTER — Emergency Department (HOSPITAL_COMMUNITY): Payer: Medicare Other

## 2023-01-25 DIAGNOSIS — Z8616 Personal history of COVID-19: Secondary | ICD-10-CM

## 2023-01-25 DIAGNOSIS — R0602 Shortness of breath: Secondary | ICD-10-CM

## 2023-01-25 DIAGNOSIS — E669 Obesity, unspecified: Secondary | ICD-10-CM | POA: Diagnosis present

## 2023-01-25 DIAGNOSIS — I959 Hypotension, unspecified: Secondary | ICD-10-CM | POA: Diagnosis not present

## 2023-01-25 DIAGNOSIS — R7989 Other specified abnormal findings of blood chemistry: Secondary | ICD-10-CM | POA: Diagnosis not present

## 2023-01-25 DIAGNOSIS — I110 Hypertensive heart disease with heart failure: Secondary | ICD-10-CM | POA: Diagnosis present

## 2023-01-25 DIAGNOSIS — I480 Paroxysmal atrial fibrillation: Principal | ICD-10-CM | POA: Diagnosis present

## 2023-01-25 DIAGNOSIS — K219 Gastro-esophageal reflux disease without esophagitis: Secondary | ICD-10-CM | POA: Diagnosis not present

## 2023-01-25 DIAGNOSIS — I358 Other nonrheumatic aortic valve disorders: Secondary | ICD-10-CM | POA: Diagnosis not present

## 2023-01-25 DIAGNOSIS — I499 Cardiac arrhythmia, unspecified: Secondary | ICD-10-CM | POA: Diagnosis not present

## 2023-01-25 DIAGNOSIS — E782 Mixed hyperlipidemia: Secondary | ICD-10-CM | POA: Diagnosis present

## 2023-01-25 DIAGNOSIS — Z77090 Contact with and (suspected) exposure to asbestos: Secondary | ICD-10-CM | POA: Diagnosis present

## 2023-01-25 DIAGNOSIS — R0789 Other chest pain: Secondary | ICD-10-CM

## 2023-01-25 DIAGNOSIS — E1169 Type 2 diabetes mellitus with other specified complication: Secondary | ICD-10-CM | POA: Diagnosis not present

## 2023-01-25 DIAGNOSIS — G4733 Obstructive sleep apnea (adult) (pediatric): Secondary | ICD-10-CM | POA: Diagnosis present

## 2023-01-25 DIAGNOSIS — I5032 Chronic diastolic (congestive) heart failure: Secondary | ICD-10-CM | POA: Diagnosis present

## 2023-01-25 DIAGNOSIS — F411 Generalized anxiety disorder: Secondary | ICD-10-CM | POA: Diagnosis not present

## 2023-01-25 DIAGNOSIS — Z888 Allergy status to other drugs, medicaments and biological substances status: Secondary | ICD-10-CM | POA: Diagnosis not present

## 2023-01-25 DIAGNOSIS — I700 Atherosclerosis of aorta: Secondary | ICD-10-CM | POA: Diagnosis not present

## 2023-01-25 DIAGNOSIS — J441 Chronic obstructive pulmonary disease with (acute) exacerbation: Secondary | ICD-10-CM | POA: Diagnosis present

## 2023-01-25 DIAGNOSIS — J9611 Chronic respiratory failure with hypoxia: Secondary | ICD-10-CM | POA: Diagnosis present

## 2023-01-25 DIAGNOSIS — Z7951 Long term (current) use of inhaled steroids: Secondary | ICD-10-CM | POA: Diagnosis not present

## 2023-01-25 DIAGNOSIS — Z7982 Long term (current) use of aspirin: Secondary | ICD-10-CM

## 2023-01-25 DIAGNOSIS — I4891 Unspecified atrial fibrillation: Secondary | ICD-10-CM | POA: Diagnosis not present

## 2023-01-25 DIAGNOSIS — R079 Chest pain, unspecified: Secondary | ICD-10-CM | POA: Diagnosis not present

## 2023-01-25 DIAGNOSIS — Z95818 Presence of other cardiac implants and grafts: Secondary | ICD-10-CM

## 2023-01-25 DIAGNOSIS — R0989 Other specified symptoms and signs involving the circulatory and respiratory systems: Secondary | ICD-10-CM | POA: Diagnosis not present

## 2023-01-25 DIAGNOSIS — Z87891 Personal history of nicotine dependence: Secondary | ICD-10-CM | POA: Diagnosis not present

## 2023-01-25 DIAGNOSIS — Z8249 Family history of ischemic heart disease and other diseases of the circulatory system: Secondary | ICD-10-CM | POA: Diagnosis not present

## 2023-01-25 DIAGNOSIS — Z9981 Dependence on supplemental oxygen: Secondary | ICD-10-CM

## 2023-01-25 DIAGNOSIS — Z91048 Other nonmedicinal substance allergy status: Secondary | ICD-10-CM

## 2023-01-25 DIAGNOSIS — Z881 Allergy status to other antibiotic agents status: Secondary | ICD-10-CM

## 2023-01-25 DIAGNOSIS — R000 Tachycardia, unspecified: Secondary | ICD-10-CM | POA: Diagnosis not present

## 2023-01-25 DIAGNOSIS — Z79899 Other long term (current) drug therapy: Secondary | ICD-10-CM | POA: Diagnosis not present

## 2023-01-25 DIAGNOSIS — Z1152 Encounter for screening for COVID-19: Secondary | ICD-10-CM | POA: Diagnosis not present

## 2023-01-25 DIAGNOSIS — R609 Edema, unspecified: Secondary | ICD-10-CM | POA: Diagnosis not present

## 2023-01-25 DIAGNOSIS — E785 Hyperlipidemia, unspecified: Secondary | ICD-10-CM | POA: Diagnosis present

## 2023-01-25 DIAGNOSIS — Z6826 Body mass index (BMI) 26.0-26.9, adult: Secondary | ICD-10-CM

## 2023-01-25 LAB — CBC WITH DIFFERENTIAL/PLATELET
Abs Immature Granulocytes: 0.07 10*3/uL (ref 0.00–0.07)
Basophils Absolute: 0 10*3/uL (ref 0.0–0.1)
Basophils Relative: 0 %
Eosinophils Absolute: 0 10*3/uL (ref 0.0–0.5)
Eosinophils Relative: 0 %
HCT: 37.4 % — ABNORMAL LOW (ref 39.0–52.0)
Hemoglobin: 12.1 g/dL — ABNORMAL LOW (ref 13.0–17.0)
Immature Granulocytes: 1 %
Lymphocytes Relative: 5 %
Lymphs Abs: 0.6 10*3/uL — ABNORMAL LOW (ref 0.7–4.0)
MCH: 26.5 pg (ref 26.0–34.0)
MCHC: 32.4 g/dL (ref 30.0–36.0)
MCV: 81.8 fL (ref 80.0–100.0)
Monocytes Absolute: 0.4 10*3/uL (ref 0.1–1.0)
Monocytes Relative: 4 %
Neutro Abs: 10.3 10*3/uL — ABNORMAL HIGH (ref 1.7–7.7)
Neutrophils Relative %: 90 %
Platelets: 228 10*3/uL (ref 150–400)
RBC: 4.57 MIL/uL (ref 4.22–5.81)
RDW: 15 % (ref 11.5–15.5)
WBC: 11.4 10*3/uL — ABNORMAL HIGH (ref 4.0–10.5)
nRBC: 0 % (ref 0.0–0.2)

## 2023-01-25 LAB — PROCALCITONIN: Procalcitonin: <0.1 ng/mL

## 2023-01-25 LAB — URINALYSIS, COMPLETE (UACMP) WITH MICROSCOPIC
Bacteria, UA: NONE SEEN
Bilirubin Urine: NEGATIVE
Glucose, UA: NEGATIVE mg/dL
Hgb urine dipstick: NEGATIVE
Ketones, ur: NEGATIVE mg/dL
Leukocytes,Ua: NEGATIVE
Nitrite: NEGATIVE
Protein, ur: NEGATIVE mg/dL
Specific Gravity, Urine: 1.013 (ref 1.005–1.030)
pH: 6 (ref 5.0–8.0)

## 2023-01-25 LAB — HEPATIC FUNCTION PANEL
ALT: 48 U/L — ABNORMAL HIGH (ref 0–44)
AST: 30 U/L (ref 15–41)
Albumin: 3.2 g/dL — ABNORMAL LOW (ref 3.5–5.0)
Alkaline Phosphatase: 59 U/L (ref 38–126)
Bilirubin, Direct: <0.1 mg/dL (ref 0.0–0.2)
Total Bilirubin: 0.6 mg/dL (ref 0.3–1.2)
Total Protein: 6.1 g/dL — ABNORMAL LOW (ref 6.5–8.1)

## 2023-01-25 LAB — CBC
HCT: 44.1 % (ref 39.0–52.0)
Hemoglobin: 13.6 g/dL (ref 13.0–17.0)
MCH: 26.2 pg (ref 26.0–34.0)
MCHC: 30.8 g/dL (ref 30.0–36.0)
MCV: 84.8 fL (ref 80.0–100.0)
Platelets: 279 10*3/uL (ref 150–400)
RBC: 5.2 MIL/uL (ref 4.22–5.81)
RDW: 14.9 % (ref 11.5–15.5)
WBC: 15.1 10*3/uL — ABNORMAL HIGH (ref 4.0–10.5)
nRBC: 0 % (ref 0.0–0.2)

## 2023-01-25 LAB — RESP PANEL BY RT-PCR (RSV, FLU A&B, COVID)  RVPGX2
Influenza A by PCR: NEGATIVE
Influenza B by PCR: NEGATIVE
Resp Syncytial Virus by PCR: NEGATIVE
SARS Coronavirus 2 by RT PCR: NEGATIVE

## 2023-01-25 LAB — I-STAT VENOUS BLOOD GAS, ED
Acid-Base Excess: 10 mmol/L — ABNORMAL HIGH (ref 0.0–2.0)
Bicarbonate: 36.2 mmol/L — ABNORMAL HIGH (ref 20.0–28.0)
Calcium, Ion: 1.14 mmol/L — ABNORMAL LOW (ref 1.15–1.40)
HCT: 38 % — ABNORMAL LOW (ref 39.0–52.0)
Hemoglobin: 12.9 g/dL — ABNORMAL LOW (ref 13.0–17.0)
O2 Saturation: 79 %
Potassium: 4.5 mmol/L (ref 3.5–5.1)
Sodium: 137 mmol/L (ref 135–145)
TCO2: 38 mmol/L — ABNORMAL HIGH (ref 22–32)
pCO2, Ven: 53.8 mmHg (ref 44–60)
pH, Ven: 7.436 — ABNORMAL HIGH (ref 7.25–7.43)
pO2, Ven: 43 mmHg (ref 32–45)

## 2023-01-25 LAB — BASIC METABOLIC PANEL
Anion gap: 12 (ref 5–15)
BUN: 12 mg/dL (ref 8–23)
CO2: 31 mmol/L (ref 22–32)
Calcium: 9.2 mg/dL (ref 8.9–10.3)
Chloride: 95 mmol/L — ABNORMAL LOW (ref 98–111)
Creatinine, Ser: 0.95 mg/dL (ref 0.61–1.24)
GFR, Estimated: >60 mL/min (ref >60)
Glucose, Bld: 136 mg/dL — ABNORMAL HIGH (ref 70–99)
Potassium: 4.2 mmol/L (ref 3.5–5.1)
Sodium: 138 mmol/L (ref 135–145)

## 2023-01-25 LAB — MRSA NEXT GEN BY PCR, NASAL: MRSA by PCR Next Gen: NOT DETECTED

## 2023-01-25 LAB — D-DIMER, QUANTITATIVE: D-Dimer, Quant: 2.03 ug/mL-FEU — ABNORMAL HIGH (ref 0.00–0.50)

## 2023-01-25 LAB — LACTIC ACID, PLASMA
Lactic Acid, Venous: 1.5 mmol/L (ref 0.5–1.9)
Lactic Acid, Venous: 1.6 mmol/L (ref 0.5–1.9)

## 2023-01-25 LAB — PROTIME-INR
INR: 0.9 (ref 0.8–1.2)
Prothrombin Time: 12 seconds (ref 11.4–15.2)

## 2023-01-25 LAB — TROPONIN I (HIGH SENSITIVITY)
Troponin I (High Sensitivity): 27 ng/L — ABNORMAL HIGH (ref <18)
Troponin I (High Sensitivity): 22 ng/L — ABNORMAL HIGH (ref <18)
Troponin I (High Sensitivity): 17 ng/L (ref <18)

## 2023-01-25 LAB — PHOSPHORUS: Phosphorus: 3.8 mg/dL (ref 2.5–4.6)

## 2023-01-25 LAB — TSH: TSH: 0.792 u[IU]/mL (ref 0.350–4.500)

## 2023-01-25 LAB — MAGNESIUM: Magnesium: 2.1 mg/dL (ref 1.7–2.4)

## 2023-01-25 LAB — GLUCOSE, CAPILLARY: Glucose-Capillary: 157 mg/dL — ABNORMAL HIGH (ref 70–99)

## 2023-01-25 LAB — CK: Total CK: 56 U/L (ref 49–397)

## 2023-01-25 MED ORDER — ORAL CARE MOUTH RINSE: 15.0000 mL | OROMUCOSAL | Status: DC | PRN

## 2023-01-25 MED ORDER — PANTOPRAZOLE SODIUM 40 MG PO TBEC
40.0000 mg | DELAYED_RELEASE_TABLET | Freq: Two times a day (BID) | ORAL | Status: DC
  Administered 2023-01-25 – 2023-01-28 (×6): 40 mg via ORAL
  Filled 2023-01-25 (×6): qty 1

## 2023-01-25 MED ORDER — DILTIAZEM LOAD VIA INFUSION
20.0000 mg | Freq: Once | INTRAVENOUS | Status: AC
  Administered 2023-01-25: 20 mg via INTRAVENOUS
  Filled 2023-01-25: qty 20

## 2023-01-25 MED ORDER — LEVALBUTEROL HCL 0.63 MG/3ML IN NEBU
0.6300 mg | INHALATION_SOLUTION | Freq: Four times a day (QID) | RESPIRATORY_TRACT | Status: DC | PRN
  Administered 2023-01-25: 0.63 mg via RESPIRATORY_TRACT
  Filled 2023-01-25: qty 3

## 2023-01-25 MED ORDER — SERTRALINE HCL 50 MG PO TABS
50.0000 mg | ORAL_TABLET | Freq: Every day | ORAL | Status: DC
  Administered 2023-01-25 – 2023-01-27 (×3): 50 mg via ORAL
  Filled 2023-01-25 (×3): qty 1

## 2023-01-25 MED ORDER — HYDROCODONE-ACETAMINOPHEN 5-325 MG PO TABS: 1.0000 | ORAL_TABLET | ORAL | Status: DC | PRN

## 2023-01-25 MED ORDER — DILTIAZEM HCL-DEXTROSE 125-5 MG/125ML-% IV SOLN (PREMIX)
5.0000 mg/h | INTRAVENOUS | Status: DC
  Administered 2023-01-25: 5 mg/h via INTRAVENOUS
  Administered 2023-01-26: 7.5 mg/h via INTRAVENOUS
  Filled 2023-01-25 (×2): qty 125

## 2023-01-25 MED ORDER — ALBUTEROL SULFATE (2.5 MG/3ML) 0.083% IN NEBU
5.0000 mg | INHALATION_SOLUTION | Freq: Once | RESPIRATORY_TRACT | Status: AC
  Administered 2023-01-25: 5 mg via RESPIRATORY_TRACT
  Filled 2023-01-25: qty 6

## 2023-01-25 MED ORDER — HYDROCORTISONE SOD SUC (PF) 100 MG IJ SOLR
100.0000 mg | Freq: Once | INTRAMUSCULAR | Status: AC
  Administered 2023-01-25: 100 mg via INTRAVENOUS
  Filled 2023-01-25: qty 2

## 2023-01-25 MED ORDER — LORATADINE 10 MG PO TABS
10.0000 mg | ORAL_TABLET | Freq: Every morning | ORAL | Status: DC
  Administered 2023-01-26 – 2023-01-28 (×3): 10 mg via ORAL
  Filled 2023-01-25 (×3): qty 1

## 2023-01-25 MED ORDER — SODIUM CHLORIDE 0.9 % IV SOLN
250.0000 mL | INTRAVENOUS | Status: DC | PRN
  Administered 2023-01-25: 250 mL via INTRAVENOUS

## 2023-01-25 MED ORDER — METHYLPREDNISOLONE SODIUM SUCC 40 MG IJ SOLR
40.0000 mg | Freq: Two times a day (BID) | INTRAMUSCULAR | Status: AC
  Administered 2023-01-25 – 2023-01-26 (×2): 40 mg via INTRAVENOUS
  Filled 2023-01-25 (×2): qty 1

## 2023-01-25 MED ORDER — IOHEXOL 350 MG/ML SOLN
75.0000 mL | Freq: Once | INTRAVENOUS | Status: AC | PRN
  Administered 2023-01-25: 75 mL via INTRAVENOUS

## 2023-01-25 MED ORDER — ACETAMINOPHEN 325 MG PO TABS: 650.0000 mg | ORAL_TABLET | Freq: Four times a day (QID) | ORAL | Status: DC | PRN

## 2023-01-25 MED ORDER — SODIUM CHLORIDE 0.9% FLUSH
3.0000 mL | Freq: Two times a day (BID) | INTRAVENOUS | Status: DC
  Administered 2023-01-25: 10 mL via INTRAVENOUS
  Administered 2023-01-26 – 2023-01-28 (×5): 3 mL via INTRAVENOUS

## 2023-01-25 MED ORDER — ACETAMINOPHEN 650 MG RE SUPP: 650.0000 mg | Freq: Four times a day (QID) | RECTAL | Status: DC | PRN

## 2023-01-25 MED ORDER — SODIUM CHLORIDE 0.9 % IV SOLN
1.0000 g | INTRAVENOUS | Status: DC
  Administered 2023-01-25 – 2023-01-27 (×3): 1 g via INTRAVENOUS
  Filled 2023-01-25 (×3): qty 10

## 2023-01-25 MED ORDER — SODIUM CHLORIDE 0.9% FLUSH: 3.0000 mL | INTRAVENOUS | Status: DC | PRN

## 2023-01-25 MED ORDER — IOHEXOL 350 MG/ML SOLN: 75.0000 mL | Freq: Once | INTRAVENOUS | Status: DC | PRN

## 2023-01-25 MED ORDER — GUAIFENESIN ER 600 MG PO TB12
600.0000 mg | ORAL_TABLET | Freq: Two times a day (BID) | ORAL | Status: DC
  Administered 2023-01-25 – 2023-01-28 (×6): 600 mg via ORAL
  Filled 2023-01-25 (×6): qty 1

## 2023-01-25 MED ORDER — PREDNISONE 20 MG PO TABS
40.0000 mg | ORAL_TABLET | Freq: Every day | ORAL | Status: DC
  Administered 2023-01-27 – 2023-01-28 (×2): 40 mg via ORAL
  Filled 2023-01-25 (×2): qty 2

## 2023-01-25 MED ORDER — IPRATROPIUM BROMIDE 0.02 % IN SOLN
0.5000 mg | Freq: Once | RESPIRATORY_TRACT | Status: AC
  Administered 2023-01-25: 0.5 mg via RESPIRATORY_TRACT
  Filled 2023-01-25: qty 2.5

## 2023-01-25 MED ORDER — MELATONIN 3 MG PO TABS
12.0000 mg | ORAL_TABLET | Freq: Every day | ORAL | Status: DC
  Administered 2023-01-25 – 2023-01-27 (×3): 12 mg via ORAL
  Filled 2023-01-25 (×3): qty 4

## 2023-01-25 MED ORDER — ATORVASTATIN CALCIUM 40 MG PO TABS
40.0000 mg | ORAL_TABLET | Freq: Every day | ORAL | Status: DC
  Administered 2023-01-25 – 2023-01-27 (×3): 40 mg via ORAL
  Filled 2023-01-25 (×3): qty 1

## 2023-01-25 MED ORDER — ALPRAZOLAM 0.5 MG PO TABS: 0.5000 mg | ORAL_TABLET | Freq: Two times a day (BID) | ORAL | Status: DC | PRN

## 2023-01-25 MED ORDER — ASPIRIN 81 MG PO TBEC
81.0000 mg | DELAYED_RELEASE_TABLET | Freq: Every day | ORAL | Status: DC
  Administered 2023-01-25 – 2023-01-28 (×4): 81 mg via ORAL
  Filled 2023-01-25 (×4): qty 1

## 2023-01-25 NOTE — Assessment & Plan Note (Signed)
Given worsening tachycardia shortness of breath leg edema will obtain Dopplers and CTA patient not on anticoagulation Noted diminished mobility

## 2023-01-25 NOTE — H&P (Signed)
KHALFANI WEIDEMAN RDE:[id number] DOB: 03/02/1946 DOA: 01/25/2023     PCP: [name], MD   Outpatient Specialists:  CARDS:   Dr.Croitoru Dr. Marlou Porch   Pulmonary  Dr. Melvyn Novas    Patient arrived to ER on 01/25/23 at 1458 Referred by Attending Toy Baker, MD   Patient coming from:    home Lives With family    Chief Complaint: Shortness of breath and cough   HPI: Paul [name] is a 77 y.o. male with medical history significant of COPD on O2 2L chronically, A-fib not on anticoagulant sp watchman, DM2 CHF diastolic HLD anemia    Presented with   shortness of breath and cough Recently admitted with COPD exacerbation has known history of A-fib not anticoagulated given history of hematuria presented with 12 hours of feeling palpitations found to have A-fib with RVR heart rate 170s reports increased shortness of breath and some coughing never completed recovered after his recent admission for COPD Known history of COPD on chronic oxygen Denies any chest pain no pleuritic chest pain no leg swelling no fevers or chills   Has not smoked for the past 15 year  Need o2 w activity and sleep lately needed it every day   No fever no chills   W frequent COPD exacerbations   Initial COVID TEST  NEGATIVE   Lab Results  Component Value Date   SARSCOV2NAA NEGATIVE 01/25/2023   [place] NEGATIVE 01/16/2023   [place] NEGATIVE 07/16/2022   [place] NEGATIVE 03/02/2022    Regarding pertinent Chronic problems:     Hyperlipidemia - on statins Lipitor (atorvastatin)  Lipid Panel     Component Value Date/Time   CHOL 189 11/18/2022 1026   TRIG 138 11/18/2022 1026   HDL 72 11/18/2022 1026   CHOLHDL 2.6 11/18/2022 1026   LDLCALC 93 11/18/2022 1026   LABVLDL 24 11/18/2022 1026     chronic CHF diastolic/  - last echo 6314    DM 2 -  Lab Results  Component Value Date   HGBA1C 6.3 (H) 11/18/2022    diet controlled      COPD -  followed by pulmonology    on baseline oxygen   2L,      OSA -on nocturnal oxygen,     A. Fib -  - CHA2DS2 vas score    6      Not on anticoagulation secondary to Risk of Falls recurrent bleeding          While in ER:   Has been on prednisone down to 25  Had been on for the past 9 years  CXR -  NON acute  CTA chest - orderd  Following Medications were ordered in ER: Medications  diltiazem (CARDIZEM) 1 mg/mL load via infusion 20 mg (20 mg Intravenous Bolus from Bag 01/25/23 1542)    And  diltiazem (CARDIZEM) 125 mg in dextrose 5% 125 mL (1 mg/mL) infusion (7.5 mg/hr Intravenous Infusion Verify 01/25/23 1755)  hydrocortisone sodium succinate (SOLU-CORTEF) 100 MG injection 100 mg (100 mg Intravenous Given 01/25/23 1543)  albuterol (PROVENTIL) (2.5 MG/3ML) 0.083% nebulizer solution 5 mg (5 mg Nebulization Given 01/25/23 1543)  ipratropium (ATROVENT) nebulizer solution 0.5 mg (0.5 mg Nebulization Given 01/25/23 1543)    ________________    ED Triage Vitals [01/25/23 1505]  Enc Vitals Group     BP (!) 157/96     Pulse Rate (!) 151     Resp 20     Temp 98.1 F (36.7 C)  Temp Source Oral     SpO2 100 %     Weight      Height      Head Circumference      Peak Flow      Pain Score      Pain Loc      Pain Edu?      Excl. in [place]?   INOM(76)@     _________________________________________ Significant initial  Findings: Abnormal Labs Reviewed  BASIC METABOLIC PANEL - Abnormal; Notable for the following components:      Result Value   Chloride 95 (*)    Glucose, Bld 136 (*)    All other components within normal limits  CBC - Abnormal; Notable for the following components:   WBC 15.1 (*)    All other components within normal limits     _________________________ Troponin 27 ECG: Ordered Personally reviewed and interpreted by me showing: HR : 143 Rhythm: Atrial fibrillation with rapid V-rate QTC 415  The recent clinical data is shown below. Vitals:   01/25/23 1600 01/25/23 1715 01/25/23 1730 01/25/23 1900  BP: 122/65      Pulse: 87 (!) 145 (!) 129 (!) 113  Resp: (!) 22 (!) 21 20 (!) 22  Temp:      TempSrc:      SpO2: 100% 100% 99% 100%     WBC     Component Value Date/Time   WBC 15.1 (H) 01/25/2023 1509   LYMPHSABS 0.3 (L) 01/22/2023 1404   MONOABS 0.8 01/16/2023 1134   EOSABS 0.0 01/22/2023 1404   BASOSABS 0.0 01/22/2023 1404      Procalcitonin   Ordered Lactic Acid, Venous    Component Value Date/Time   LATICACIDVEN 4.1 ([place]) 01/15/2021 0858        UA  ordered     Results for orders placed or performed during the hospital encounter of 01/25/23  Resp panel by RT-PCR (RSV, Flu A&B, Covid) Anterior Nasal Swab     Status: None   Collection Time: 01/25/23  6:00 PM   Specimen: Anterior Nasal Swab  Result Value Ref Range Status   SARS Coronavirus 2 by RT PCR NEGATIVE NEGATIVE Final   Influenza A by PCR NEGATIVE NEGATIVE Final   Influenza B by PCR NEGATIVE NEGATIVE Final          Resp Syncytial Virus by PCR NEGATIVE NEGATIVE Final          _______________________________________________ Hospitalist was called for admission for   Atrial fibrillation with rapid ventricular response, COPD exacerbation    The following Work up has been ordered so far:  Orders Placed This Encounter  Procedures   Resp panel by RT-PCR (RSV, Flu A&B, Covid) Anterior Nasal Swab   DG Chest Portable 1 View   Basic metabolic panel   CBC   D-dimer, quantitative   Magnesium   Phosphorus   Prealbumin   Protime-INR   TSH   Urinalysis, Complete w Microscopic -Urine, Clean Catch   CK   Hepatic function panel   CBC with Differential/Platelet   Document Height and Actual Weight   Cardiac Monitoring - Continuous Indefinite   Consult to hospitalist   I-Stat venous blood gas, ([facility], [facility], [facility])   EKG 12-Lead   ED EKG   Admit to Inpatient (patient's expected length of stay will be greater than 2 midnights or inpatient only procedure)     OTHER Significant initial  Findings:  labs showing:    Recent Labs   Lab  01/22/23 1404 01/25/23 1509  NA 138 138  K 5.7* 4.2  CO2 29 31  GLUCOSE 153* 136*  BUN 14 12  CREATININE 0.72* 0.95  CALCIUM 9.6 9.2    Cr stable,   Lab Results  Component Value Date   CREATININE 0.95 01/25/2023   CREATININE 0.72 (L) 01/22/2023   CREATININE 0.77 01/16/2023    Recent Labs  Lab 01/22/23 1404  AST 31  ALT 56*  ALKPHOS 83  BILITOT 0.3  PROT 6.6  ALBUMIN 4.4   Lab Results  Component Value Date   CALCIUM 9.2 01/25/2023       Plt: Lab Results  Component Value Date   PLT 279 01/25/2023    Venous  Blood Gas result:  pH  7.436 High  Sodium 137 mmol/L   pCO2, Ven 53.8 mmHg Potassium 4.5 mmol/L  pO2, Ven 43 mmHg         Recent Labs  Lab 01/22/23 1404 01/25/23 1509  WBC 14.4* 15.1*  NEUTROABS 13.6*  --   HGB 13.4 13.6  HCT 42.5 44.1  MCV 82 84.8  PLT 268 279    HG/HCT  stable,       Component Value Date/Time   HGB 13.6 01/25/2023 1509   HGB 13.4 01/22/2023 1404   HCT 44.1 01/25/2023 1509   HCT 42.5 01/22/2023 1404   MCV 84.8 01/25/2023 1509   MCV 82 01/22/2023 1404        .car BNP (last 3 results) Recent Labs    03/02/22 1000 07/16/22 1320 01/16/23 1134  BNP 107.5* 48.8 66.6      DM  labs:  HbA1C: Recent Labs    02/11/22 0819 08/12/22 0925 11/18/22 1026  HGBA1C 6.5* 6.3* 6.3*       CBG (last 3)  No results for input(s): "GLUCAP" in the last 72 hours.        Cultures:    Component Value Date/Time   SDES EXPECTORATED SPUTUM 01/16/2023 1951   SDES  01/16/2023 1951    EXPECTORATED SPUTUM Performed at [facility], [place] [address], [place], [place] [address]    [place] 01/16/2023 1951   SPECREQUEST  01/16/2023 1951    NONE Reflexed from [id number] Performed at [facility], [place] [address] [place], [place] [address]    CULT  01/16/2023 1951    RARE Normal respiratory flora-no Staph aureus or Pseudomonas seen Performed at [facility], [place] [address] [place], [place] [address]    REPTSTATUS 01/17/2023 FINAL 01/16/2023 1951   REPTSTATUS 01/20/2023 FINAL 01/16/2023 1951     Radiological Exams on Admission: DG Chest Portable 1 View  Result Date: 01/25/2023 CLINICAL DATA:  Shortness of breath and chest pain. EXAM: PORTABLE CHEST 1 VIEW COMPARISON:  January 16, 2023 FINDINGS: Cardiomediastinal silhouette is normal. Mediastinal contours appear intact. There is no evidence of focal airspace consolidation, pleural effusion or pneumothorax. Osseous structures are without acute abnormality. Soft tissues are grossly normal. IMPRESSION: No active disease. Electronically Signed   By: Fidela Salisbury M.D.   On: 01/25/2023 15:44   _______________________________________________________________________________________________________ Latest  Blood pressure 122/65, pulse (!) 113, temperature 98.1 F (36.7 C), temperature source Oral, resp. rate (!) 22, SpO2 100 %.   Vitals  labs and radiology finding personally reviewed  Review of Systems:    Pertinent positives include:  fatigue,   shortness of breath at rest. dyspnea on exertion,  Constitutional:  No weight loss, night sweats, Fevers, chills, weight loss  HEENT:  No headaches, Difficulty swallowing,Tooth/dental problems,Sore throat,  No sneezing, itching, ear ache, nasal congestion, post nasal drip,  Cardio-vascular:  No chest pain, Orthopnea, PND, anasarca, dizziness, palpitations.no Bilateral lower extremity swelling  GI:  No heartburn, indigestion, abdominal pain, nausea, vomiting, diarrhea, change in bowel habits, loss of appetite, melena, blood in stool, hematemesis Resp:  noNo excess mucus, no productive cough, No non-productive cough, No coughing up of blood.No change in color of mucus.No wheezing. Skin:  no rash or lesions. No jaundice GU:  no dysuria, change in color of urine, no urgency or frequency. No straining to urinate.  No flank pain.  Musculoskeletal:  No joint pain or no  joint swelling. No decreased range of motion. No back pain.  Psych:  No change in mood or affect. No depression or anxiety. No memory loss.  Neuro: no localizing neurological complaints, no tingling, no weakness, no double vision, no gait abnormality, no slurred speech, no confusion  All systems reviewed and apart from [place] all are negative _______________________________________________________________________________________________ Past Medical History:   Past Medical History:  Diagnosis Date   Anemia    Aortic atherosclerosis (HCC)    Aspiration pneumonia due to inhalation of vomitus ([place]) 02/03/2022   Atrial fibrillation (HCC)    Back pain    COPD (chronic obstructive pulmonary disease) (HCC)    COPD exacerbation ([place]) 12/27/2021   COVID-19 12/24/2020   Diabetes mellitus without complication (HCC)    Dizziness 04/08/2022   Elevated PSA    GAD (generalized anxiety disorder)    GERD (gastroesophageal reflux disease)    High cholesterol    Hypertension    Lingular pneumonia 01/05/2021   See cxr 01/06/20 rx with zpak/ cefipime x 5 days and then developed purulent sputum again 01/12/21 so rx levcaquin 500 mg daily x 7 days then return for cxr before more    Lung nodule < 6cm on CT 05/29/2016   Osteoporosis    Oxygen deficiency    Pneumonia    January 2022   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 Quentin Ore with a 20 mm Watchman FLX device   RSV bronchitis 01/27/2022   Vertebral compression fracture ([place]) 05/29/2019   T8 compression fracture noted on CT scan 05/28/2019 inpatient with chronic steroid dependent COPD - rx calcitonin nasal spray rx per PCP      Past Surgical History:  Procedure Laterality Date   CATARACT EXTRACTION Right    ELBOW SURGERY  07/2022   surgery on olecranon bursa. Dr. Greta Doom   IR KYPHO EA ADDL LEVEL THORACIC OR LUMBAR  11/28/2021   LEFT ATRIAL APPENDAGE OCCLUSION N/A 08/02/2021   Procedure: LEFT ATRIAL  APPENDAGE OCCLUSION;  Surgeon: Sherren Mocha, MD;  Location: [facility];  Service: Cardiovascular;  Laterality: N/A;   SKIN SURGERY     shoulders and chest   TEE WITHOUT CARDIOVERSION N/A 08/02/2021   Procedure: TRANSESOPHAGEAL ECHOCARDIOGRAM (TEE);  Surgeon: Sherren Mocha, MD;  Location: [facility];  Service: Cardiovascular;  Laterality: N/A;   TEE WITHOUT CARDIOVERSION N/A 09/13/2021   Procedure: TRANSESOPHAGEAL ECHOCARDIOGRAM (TEE);  Surgeon: Sanda Klein, MD;  Location: [facility];  Service: Cardiovascular;  Laterality: N/A;    Social History:  Ambulatory  walker        reports that he quit smoking about 13 years ago. His smoking use included cigarettes. He has a 104.00 pack-year smoking history. He has never used smokeless tobacco. He reports that he does not  currently use alcohol. He reports that he does not use drugs.     Family History:  Family History  Problem Relation Age of Onset   Heart disease Brother    Heart disease Mother    Heart disease Father    Cancer Paternal Uncle    ______________________________________________________________________________________________ Allergies: Allergies  Allergen Reactions   Tape Other (See Comments)    PLEASE USE COBAN WRAP IN LIEU OF "TAPE," as it "pulls off the skin!!   Daliresp [Roflumilast] Diarrhea and Nausea And Vomiting   Levaquin [Levofloxacin] Palpitations and Other (See Comments)    Made the B/P fluctuate and heart raced   Alendronate Anxiety and Other (See Comments)    Jittery/nervous     Prior to Admission medications   Medication Sig Start Date End Date Taking? Authorizing Provider  acetaminophen (TYLENOL) 500 MG tablet Take 1,000 mg by mouth at bedtime as needed (pain).    [provider]  albuterol (PROAIR HFA) 108 (90 Base) MCG/ACT inhaler Inhale 2 puffs into the lungs as needed for wheezing or shortness of breath.    [provider]  albuterol (PROVENTIL) (2.5  MG/3ML) 0.083% nebulizer solution Take 3 mLs (2.5 mg total) by nebulization every 6 (six) hours as needed for shortness of breath or wheezing. 10/23/22   Parrett, Fonnie Mu, NP  ALPRAZolam (XANAX) 0.5 MG tablet Take 1 tablet (0.5 mg total) by mouth 2 (two) times daily as needed for anxiety. 07/25/22   CoxElnita Maxwell, MD  Ascorbic Acid (VITAMIN C) 1000 MG tablet Take 1,000 mg by mouth daily.    [provider]  aspirin EC 81 MG tablet Take 1 tablet (81 mg total) by mouth daily. Swallow whole. 01/28/22   Tommie Raymond, NP  atorvastatin (LIPITOR) 40 MG tablet TAKE 1 TABLET BY MOUTH EVERYDAY AT BEDTIME Patient taking differently: Take 40 mg by mouth at bedtime. 11/26/22   Marge Duncans, PA-C  Budeson-Glycopyrrol-Formoterol (BREZTRI AEROSPHERE) 160-9-4.8 MCG/ACT AERO Inhale 2 puffs into the lungs 2 (two) times daily. 01/02/23   Cox, Elnita Maxwell, MD  calcium carbonate (OS-CAL) 600 MG TABS tablet Take 600 mg by mouth daily.    [provider]  Cholecalciferol (VITAMIN D3) 50 MCG (2000 UT) TABS Take 2,000 Units by mouth in the morning.    [provider]  denosumab (PROLIA) 60 MG/ML SOSY injection Inject 60 mg into the skin every 6 (six) months.    [provider]  ferrous sulfate 325 (65 FE) MG tablet Take 325 mg by mouth 2 (two) times daily with a meal.    [provider]  fluticasone (FLONASE) 50 MCG/ACT nasal spray Place 2 sprays into both nostrils daily as needed for allergies or rhinitis.    [provider]  guaiFENesin (MUCINEX) 600 MG 12 hr tablet Take 2 tablets (1,200 mg total) by mouth 2 (two) times daily. Patient taking differently: Take 600 mg by mouth 2 (two) times daily. 07/21/22   Regalado, Belkys A, MD  loratadine (CLARITIN) 10 MG tablet Take 10 mg by mouth in the morning.    [provider]  Melatonin 12 MG TABS Take 12 mg by mouth at bedtime.    [provider]  Multiple Vitamin (MULTIVITAMIN WITH MINERALS) TABS tablet Take 1 tablet  by mouth daily with breakfast.    [provider]  OXYGEN Inhale 2 L/min into the lungs at bedtime.    [provider]  pantoprazole (PROTONIX) 40 MG tablet Take 1 tablet (40 mg total) by mouth  2 (two) times daily. 11/06/22   Tanda Rockers, MD  predniSONE (DELTASONE) 10 MG tablet Takes  4 tablets for 1 days, then 3 tablets for 1 days, then 2 tabs for 1 days, then 1 tab for 1 days, and then stop. 01/19/23   Charlynne Cousins, MD  Probiotic Product (PROBIOTIC DAILY) CAPS Take 1 capsule by mouth in the morning.    [provider]  sertraline (ZOLOFT) 50 MG tablet Take 1 tablet (50 mg total) by mouth at bedtime. 09/16/22   [name], MD    ___________________________________________________________________________________________________ Physical Exam:    01/25/2023    7:00 PM 01/25/2023    5:30 PM 01/25/2023    5:15 PM  Vitals with BMI  Pulse 113 129 145     1. General:  in No  Acute distress    Chronically ill  -appearing 2. Psychological: Alert and   Oriented 3. Head/ENT:    Dry Mucous Membranes                          Head Non traumatic, neck supple                          Poor Dentition 4. SKIN: normal  Skin turgor,  Skin clean Dry and intact no rash 5. Heart: Regular rate and rhythm no  Murmur, no Rub or gallop 6. Lungs:  bilateral  wheezes or crackles   7. Abdomen: Soft,  non-tender, Non distended   obese  bowel sounds present 8. Lower extremities: no clubbing, cyanosis,  trace edema 9. Neurologically Grossly intact, moving all 4 extremities equally   10. MSK: Normal range of motion    Chart has been reviewed  ______________________________________________________________________________________________  Assessment/Plan  77 y.o. male with medical history significant of COPD on O2 2L chronically, A-fib not on anticoagulant sp watchman, DM2 CHF diastolic HLD anemia  Admitted for   Atrial fibrillation with RVr and  COPD exacerbation      Present on Admission:  Atrial fibrillation with RVR ([place])  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[place])  HLD (hyperlipidemia)  COPD with acute exacerbation (HCC)  Chronic respiratory failure with hypoxia (HCC)  Chronic diastolic CHF (congestive heart failure) (HCC)  Elevated d-dimer     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[place]) Order sliding scale and diabetes coordinator consult  HLD (hyperlipidemia) Continue lipitor 40 mg po qday  Atrial fibrillation with RVR ([place]) On diltiazem drip  Admit to progressive  Appreciate cardiology consult   Echo in Am  Pt is sp watchman  COPD with acute exacerbation ([place])  -  - Will initiate: Steroid taper  -  Antibiotics rocephin -  XopenexPRN, - scheduled duoneb,  -  Breo or Dulera at discharge   -  Mucinex.  Titrate O2 to saturation >90%. Follow patients respiratory status.  VBG  -    -   BiPAP ordered PRN for increased work of breathing.  Currently mentating well no evidence of symptomatic hypercarbia   Chronic respiratory failure with hypoxia (HCC) Continue oxygen 2L  Likely in the setting of copd  Chronic diastolic CHF (congestive heart failure) (HCC) Avoid fluid over load  Elevated d-dimer Given worsening tachycardia shortness of breath leg edema will obtain Dopplers and CTA patient not on anticoagulation Noted diminished mobility   Other plan as per orders.  DVT prophylaxis:  SCD      Code Status:  Code Status: Prior FULL CODE   as per patient   I had personally discussed CODE STATUS with patient      Family Communication:   Family not at  Bedside    Disposition Plan:     To home once workup is complete and patient is stable   Following barriers for discharge:                                                        Will need consultants to evaluate patient prior to discharge                       Would benefit from PT/OT eval prior to DC  Ordered                                       Diabetes care coordinator        Consults called: cardiology consulted  Admission status:  ED Disposition     ED Disposition  [place]: [place] [[id number]]  Level of Care: Progressive [102]  Admit to Progressive based on following criteria: CARDIOVASCULAR & THORACIC of moderate stability with acute coronary syndrome symptoms/low risk myocardial infarction/hypertensive urgency/arrhythmias/heart failure potentially compromising stability and stable post cardiovascular intervention patients.  May admit patient to [facility] or [facility] if equivalent level of care is available:: No  Covid Evaluation: Asymptomatic - no recent exposure (last 10 days) testing not required  Diagnosis: Atrial fibrillation with RVR [facility]) [[id number]]  Admitting Physician: Toy Baker [[id number]]  Attending Physician: Toy Baker [[id number]]  Certification:: I certify this patient will need inpatient services for at least 2 midnights  Estimated Length of Stay: 2         inpatient     I Expect 2 midnight stay secondary to severity of patient's current illness need for inpatient interventions justified by the following:  hemodynamic instability despite optimal treatment (tachycardia  )   and extensive comorbidities including:  DM2   CHF  COPD/asthma    That are currently affecting medical management.   I expect  patient to be hospitalized for 2 midnights requiring inpatient medical care.  Patient is at high risk for adverse outcome (such as loss of life or disability) if not treated.  Indication for inpatient stay as follows:    Hemodynamic instability despite maximal medical therapy,    Need for IV antibiotics, IV diltiazem    Level of care    progressive tele indefinitely please discontinue once patient no longer qualifies COVID-19 Labs    Lab Results  Component Value Date   [place] 01/25/2023     Precautions:  admitted as   Covid Negative    Chantee Cerino 01/25/2023, 8:30 PM    [facility]     after 2 AM please page floor coverage PA If 7AM-7PM, please contact the day team taking care of the patient using [email or web address]   Patient was evaluated in the context of the global COVID-19 pandemic, which necessitated consideration that the patient might be at risk for infection with the SARS-CoV-2 virus that causes COVID-19. Institutional protocols and algorithms that pertain to the evaluation  of patients at risk for COVID-19 are in a state of rapid change based on information released by regulatory bodies including the CDC and federal and state organizations. These policies and algorithms were followed during the patient's care.

## 2023-01-25 NOTE — ED Notes (Signed)
Patient transported to CT 

## 2023-01-25 NOTE — Assessment & Plan Note (Signed)
Continue lipitor 40 mg po qday

## 2023-01-25 NOTE — ED Triage Notes (Signed)
Pt bib ems with reports of 12 hours of elevated heart rate. Found to be Afib RVR 170-200. Given '10mg'$  cardizem with improvement in rate. Pt with increased shob.

## 2023-01-25 NOTE — Assessment & Plan Note (Signed)
Order sliding scale and diabetes coordinator consult

## 2023-01-25 NOTE — Assessment & Plan Note (Signed)
Avoid fluid over load

## 2023-01-25 NOTE — Assessment & Plan Note (Signed)
Continue oxygen 2L  Likely in the setting of copd

## 2023-01-25 NOTE — ED Notes (Signed)
ED TO INPATIENT HANDOFF REPORT  ED Nurse Name and Phone #: Donella Stade [phone]  S Name/Age/Gender Paul [name] 77 y.o. male Room/Bed: 029C/029C  Code Status   Code Status: Prior  Home/SNF/Other Home Patient oriented to: self, place, time, and situation Is this baseline? Yes   Triage Complete: Triage complete  Chief Complaint Atrial fibrillation with RVR ([name]) [I48.91]  Triage Note Pt bib ems with reports of 12 hours of elevated heart rate. Found to be Afib RVR 170-200. Given '10mg'$  cardizem with improvement in rate. Pt with increased shob.    Allergies Allergies  Allergen Reactions   Tape Other (See Comments)    PLEASE USE COBAN WRAP IN LIEU OF "TAPE," as it "pulls off the skin!!   Daliresp [Roflumilast] Diarrhea and Nausea And Vomiting   Levaquin [Levofloxacin] Palpitations and Other (See Comments)    Made the B/P fluctuate and heart raced   Alendronate Anxiety and Other (See Comments)    Jittery/nervous    Level of Care/Admitting Diagnosis ED Disposition     ED Disposition  Admit   Condition  --   [place]: [place] [[id number]]  Level of Care: Progressive [102]  Admit to Progressive based on following criteria: CARDIOVASCULAR & THORACIC of moderate stability with acute coronary syndrome symptoms/low risk myocardial infarction/hypertensive urgency/arrhythmias/heart failure potentially compromising stability and stable post cardiovascular intervention patients.  May admit patient to [facility] or [facility] if equivalent level of care is available:: No  Covid Evaluation: Asymptomatic - no recent exposure (last 10 days) testing not required  Diagnosis: Atrial fibrillation with RVR [facility]) [[id number]]  Admitting Physician: Toy Baker [[id number]]  Attending Physician: Toy Baker [[id number]]  Certification:: I certify this patient will need inpatient services for at least 2 midnights  Estimated Length of Stay: 2           B Medical/Surgery History Past Medical History:  Diagnosis Date   Anemia    Aortic atherosclerosis ([place])    Aspiration pneumonia due to inhalation of vomitus ([place]) 02/03/2022   Atrial fibrillation (HCC)    Back pain    COPD (chronic obstructive pulmonary disease) ([place])    COPD exacerbation ([place]) 12/27/2021   COVID-19 12/24/2020   Diabetes mellitus without complication ([place])    Dizziness 04/08/2022   Elevated PSA    GAD (generalized anxiety disorder)    GERD (gastroesophageal reflux disease)    High cholesterol    Hypertension    Lingular pneumonia 01/05/2021   See cxr 01/06/20 rx with zpak/ cefipime x 5 days and then developed purulent sputum again 01/12/21 so rx levcaquin 500 mg daily x 7 days then return for cxr before more    Lung nodule < 6cm on CT 05/29/2016   Osteoporosis    Oxygen deficiency    Pneumonia    January 2022   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 Quentin Ore with a 20 mm Watchman FLX device   RSV bronchitis 01/27/2022   Vertebral compression fracture ([place]) 05/29/2019   T8 compression fracture noted on CT scan 05/28/2019 inpatient with chronic steroid dependent COPD - rx calcitonin nasal spray rx per PCP    Past Surgical History:  Procedure Laterality Date   CATARACT EXTRACTION Right    ELBOW SURGERY  07/2022   surgery on olecranon bursa. Dr. Greta Doom   IR KYPHO EA ADDL LEVEL THORACIC OR LUMBAR  11/28/2021   LEFT ATRIAL APPENDAGE OCCLUSION N/A 08/02/2021  Procedure: LEFT ATRIAL APPENDAGE OCCLUSION;  Surgeon: Sherren Mocha, MD;  Location: [place] [facility];  Service: Cardiovascular;  Laterality: N/A;   SKIN SURGERY     shoulders and chest   TEE WITHOUT CARDIOVERSION N/A 08/02/2021   Procedure: TRANSESOPHAGEAL ECHOCARDIOGRAM (TEE);  Surgeon: Sherren Mocha, MD;  Location: [place] [facility];  Service: Cardiovascular;  Laterality: N/A;   TEE WITHOUT CARDIOVERSION N/A 09/13/2021   Procedure: TRANSESOPHAGEAL  ECHOCARDIOGRAM (TEE);  Surgeon: Sanda Klein, MD;  Location: [facility] ENDOSCOPY;  Service: Cardiovascular;  Laterality: N/A;     A IV Location/Drains/Wounds Patient Lines/Drains/Airways Status     Active Line/Drains/Airways     Name Placement date Placement time Site Days   Peripheral IV 01/25/23 18 G Left Antecubital 01/25/23  1510  Antecubital  less than 1   Wound / Incision (Open or Dehisced) 01/16/23 Venous stasis ulcer Back Lateral;Left;Upper Deep red, non blanchable, with appearance of tube having lyed under patient prior to arrival 01/16/23  2200  Back  9            Intake/Output Last 24 hours  Intake/Output Summary (Last 24 hours) at 01/25/2023 1902 Last data filed at 01/25/2023 1755 Gross per 24 hour  Intake 31.75 ml  Output --  Net 31.75 ml    Labs/Imaging Results for orders placed or performed during the hospital encounter of 01/25/23 (from the past 48 hour(s))  Basic metabolic panel     Status: Abnormal   Collection Time: 01/25/23  3:09 PM  Result Value Ref Range   Sodium 138 135 - 145 mmol/L   Potassium 4.2 3.5 - 5.1 mmol/L   Chloride 95 (L) 98 - 111 mmol/L   CO2 31 22 - 32 mmol/L   Glucose, Bld 136 (H) 70 - 99 mg/dL    Comment: Glucose reference range applies only to samples taken after fasting for at least 8 hours.   BUN 12 8 - 23 mg/dL   Creatinine, Ser 0.95 0.61 - 1.24 mg/dL   Calcium 9.2 8.9 - 10.3 mg/dL   GFR, Estimated >60 >60 mL/min    Comment: (NOTE) Calculated using the CKD-EPI Creatinine Equation (2021)    Anion gap 12 5 - 15    Comment: Performed at [facility] [address] [place], [place] [address]  CBC     Status: Abnormal   Collection Time: 01/25/23  3:09 PM  Result Value Ref Range   WBC 15.1 (H) 4.0 - 10.5 K/uL   RBC 5.20 4.22 - 5.81 MIL/uL   Hemoglobin 13.6 13.0 - 17.0 g/dL   HCT 44.1 39.0 - 52.0 %   MCV 84.8 80.0 - 100.0 fL   MCH 26.2 26.0 - 34.0 pg   MCHC 30.8 30.0 - 36.0 g/dL   RDW 14.9 11.5 - 15.5 %   Platelets 279 150  - 400 K/uL   nRBC 0.0 0.0 - 0.2 %    Comment: Performed at [facility], [place] [address] [place], [place] [address]   DG Chest Portable 1 View  Result Date: 01/25/2023 CLINICAL DATA:  Shortness of breath and chest pain. EXAM: PORTABLE CHEST 1 VIEW COMPARISON:  January 16, 2023 FINDINGS: Cardiomediastinal silhouette is normal. Mediastinal contours appear intact. There is no evidence of focal airspace consolidation, pleural effusion or pneumothorax. Osseous structures are without acute abnormality. Soft tissues are grossly normal. IMPRESSION: No active disease. Electronically Signed   By: Fidela Salisbury M.D.   On: 01/25/2023 15:44    Pending Labs Unresulted  Labs (From admission, onward)     Start     Ordered   01/25/23 1829  D-dimer, quantitative  Once,   STAT        01/25/23 1829   01/25/23 1747  Resp panel by RT-PCR (RSV, Flu A&B, Covid) Anterior Nasal Swab  (Tier 2 - SymptomaticResp panel by RT-PCR (RSV, Flu A&B, Covid))  Once,   URGENT        01/25/23 1746            Vitals/Pain Today's Vitals   01/25/23 1600 01/25/23 1715 01/25/23 1730 01/25/23 1900  BP: 122/65     Pulse: 87 (!) 145 (!) 129 (!) 113  Resp: (!) 22 (!) 21 20 (!) 22  Temp:      TempSrc:      SpO2: 100% 100% 99% 100%    Isolation Precautions No active isolations  Medications Medications  diltiazem (CARDIZEM) 1 mg/mL load via infusion 20 mg (20 mg Intravenous Bolus from Bag 01/25/23 1542)    And  diltiazem (CARDIZEM) 125 mg in dextrose 5% 125 mL (1 mg/mL) infusion (7.5 mg/hr Intravenous Infusion Verify 01/25/23 1755)  hydrocortisone sodium succinate (SOLU-CORTEF) 100 MG injection 100 mg (100 mg Intravenous Given 01/25/23 1543)  albuterol (PROVENTIL) (2.5 MG/3ML) 0.083% nebulizer solution 5 mg (5 mg Nebulization Given 01/25/23 1543)  ipratropium (ATROVENT) nebulizer solution 0.5 mg (0.5 mg Nebulization Given 01/25/23 1543)    Mobility walks     Focused Assessments Cardiac Assessment Handoff:    Lab  Results  Component Value Date   TROPONINI <0.03 04/14/2019   Lab Results  Component Value Date   DDIMER 2.33 (H) 12/28/2020   Does the Patient currently have chest pain? No    R Recommendations: See Admitting Provider Note  Report given to:   Additional Notes:   Patient with ongoing afib, on 10 of cardizem currently. Denies chest pain, endorses shob worse than normal. Wears 2L [place] at night PRN at home, but has been wanting oxygen on while he has been here due to feeling shob.

## 2023-01-25 NOTE — Subjective & Objective (Signed)
Recently admitted with COPD exacerbation has known history of A-fib not anticoagulated given history of hematuria presented with 12 hours of feeling palpitations found to have A-fib with RVR heart rate 170s reports increased shortness of breath and some coughing never completed recovered after his recent admission for COPD Known history of COPD on chronic oxygen Denies any chest pain no pleuritic chest pain no leg swelling no fevers or chills

## 2023-01-25 NOTE — Assessment & Plan Note (Signed)
-  -   Will initiate: Steroid taper  -  Antibiotics rocephin -  XopenexPRN, - scheduled duoneb,  -  Breo or Dulera at discharge   -  Mucinex.  Titrate O2 to saturation >90%. Follow patients respiratory status.  VBG  -    -   BiPAP ordered PRN for increased work of breathing.  Currently mentating well no evidence of symptomatic hypercarbia

## 2023-01-25 NOTE — Assessment & Plan Note (Signed)
On diltiazem drip  Admit to progressive  Appreciate cardiology consult   Echo in Am  Pt is sp watchman

## 2023-01-25 NOTE — ED Provider Notes (Signed)
[facility] Provider Note   CSN: [id number] Arrival date & time: 01/25/23  1458     History  No chief complaint on file.   Paul [name] is a 77 y.o. male.  Pt with hx copd, afib, present indicating feels afib has been acting up in past 1-2 days. Recent hospital admission w copd exacerbation, his baseline/chronic prednisone dose was increased then. Using alb tx prn. States breathing/copd never did return to baseline post admission. +home o2. Denies chest pain or discomfort. No pleuritic pain. No anticoagulant use. No leg pain or swelling. No new or increased cough. No fever or chills. Compliant w home meds.   The history is provided by the patient, medical records, the EMS personnel and the spouse.       Home Medications Prior to Admission medications   Medication Sig Start Date End Date Taking? Authorizing Provider  acetaminophen (TYLENOL) 500 MG tablet Take 1,000 mg by mouth at bedtime as needed (pain).    [provider]  albuterol (PROAIR HFA) 108 (90 Base) MCG/ACT inhaler Inhale 2 puffs into the lungs as needed for wheezing or shortness of breath.    [provider]  albuterol (PROVENTIL) (2.5 MG/3ML) 0.083% nebulizer solution Take 3 mLs (2.5 mg total) by nebulization every 6 (six) hours as needed for shortness of breath or wheezing. 10/23/22   Parrett, Fonnie Mu, NP  ALPRAZolam (XANAX) 0.5 MG tablet Take 1 tablet (0.5 mg total) by mouth 2 (two) times daily as needed for anxiety. 07/25/22   CoxElnita Maxwell, MD  Ascorbic Acid (VITAMIN C) 1000 MG tablet Take 1,000 mg by mouth daily.    [provider]  aspirin EC 81 MG tablet Take 1 tablet (81 mg total) by mouth daily. Swallow whole. 01/28/22   Tommie Raymond, NP  atorvastatin (LIPITOR) 40 MG tablet TAKE 1 TABLET BY MOUTH EVERYDAY AT BEDTIME Patient taking differently: Take 40 mg by mouth at bedtime. 11/26/22   Marge Duncans, PA-C  Budeson-Glycopyrrol-Formoterol (BREZTRI  AEROSPHERE) 160-9-4.8 MCG/ACT AERO Inhale 2 puffs into the lungs 2 (two) times daily. 01/02/23   Cox, Elnita Maxwell, MD  calcium carbonate (OS-CAL) 600 MG TABS tablet Take 600 mg by mouth daily.    [provider]  Cholecalciferol (VITAMIN D3) 50 MCG (2000 UT) TABS Take 2,000 Units by mouth in the morning.    [provider]  denosumab (PROLIA) 60 MG/ML SOSY injection Inject 60 mg into the skin every 6 (six) months.    [provider]  ferrous sulfate 325 (65 FE) MG tablet Take 325 mg by mouth 2 (two) times daily with a meal.    [provider]  fluticasone (FLONASE) 50 MCG/ACT nasal spray Place 2 sprays into both nostrils daily as needed for allergies or rhinitis.    [provider]  guaiFENesin (MUCINEX) 600 MG 12 hr tablet Take 2 tablets (1,200 mg total) by mouth 2 (two) times daily. Patient taking differently: Take 600 mg by mouth 2 (two) times daily. 07/21/22   Regalado, Belkys A, MD  loratadine (CLARITIN) 10 MG tablet Take 10 mg by mouth in the morning.    [provider]  Melatonin 12 MG TABS Take 12 mg by mouth at bedtime.    [provider]  Multiple Vitamin (MULTIVITAMIN WITH MINERALS) TABS tablet Take 1 tablet by mouth daily with breakfast.    [provider]  OXYGEN Inhale 2 L/min into the lungs at bedtime.    [provider]  pantoprazole (PROTONIX) 40 MG tablet Take 1 tablet (40 mg total) by mouth 2 (two) times daily. 11/06/22   Tanda Rockers, MD  predniSONE (DELTASONE) 10 MG tablet Takes  4 tablets for 1 days, then 3 tablets for 1 days, then 2 tabs for 1 days, then 1 tab for 1 days, and then stop. 01/19/23   Charlynne Cousins, MD  Probiotic Product (PROBIOTIC DAILY) CAPS Take 1 capsule by mouth in the morning.    [provider]  sertraline (ZOLOFT) 50 MG tablet Take 1 tablet (50 mg total) by mouth at bedtime. 09/16/22   [name], MD      Allergies    Tape, Daliresp [roflumilast], Levaquin  [levofloxacin], and Alendronate    Review of Systems   Review of Systems  Constitutional:  Negative for chills and fever.  HENT:  Negative for sore throat.   Eyes:  Negative for redness.  Respiratory:  Positive for shortness of breath and wheezing.   Cardiovascular:  Positive for palpitations. Negative for chest pain and leg swelling.  Gastrointestinal:  Negative for abdominal pain, nausea and vomiting.  Genitourinary:  Negative for flank pain.  Musculoskeletal:  Negative for back pain and neck pain.  Skin:  Negative for rash.  Neurological:  Negative for headaches.  Hematological:  Does not bruise/bleed easily.  Psychiatric/Behavioral:  Negative for confusion.     Physical Exam Updated Vital Signs BP 122/65   Pulse (!) 145   Temp 98.1 F (36.7 C) (Oral)   Resp (!) 21   SpO2 100%  Physical Exam Vitals and nursing note reviewed.  Constitutional:      Appearance: Normal appearance. He is well-developed.  HENT:     Head: Atraumatic.     Nose: Nose normal.     Mouth/Throat:     Mouth: Mucous membranes are moist.     Pharynx: Oropharynx is clear.  Eyes:     General: No scleral icterus.    Conjunctiva/sclera: Conjunctivae normal.  Neck:     Trachea: No tracheal deviation.  Cardiovascular:     Rate and Rhythm: Tachycardia present. Rhythm irregular.     Pulses: Normal pulses.     Heart sounds: Normal heart sounds. No murmur heard.    No friction rub. No gallop.  Pulmonary:     Effort: Pulmonary effort is normal. No accessory muscle usage or respiratory distress.     Breath sounds: Wheezing present.  Abdominal:     General: There is no distension.     Palpations: Abdomen is soft.     Tenderness: There is no abdominal tenderness.  Genitourinary:    Comments: No cva tenderness. Musculoskeletal:        General: No swelling or tenderness.     Cervical back: Normal range of motion and neck supple. No rigidity.     Right lower leg: No edema.     Left lower leg: No edema.   Skin:    General: Skin is warm and dry.     Findings: No rash.  Neurological:     Mental Status: He is alert.     Comments: Alert, speech clear.   Psychiatric:        Mood and Affect: Mood normal.     ED Results / Procedures / Treatments   Labs (all labs ordered are listed, but only abnormal results are displayed) Results for orders placed or performed during the hospital encounter of 56/21/30  Basic metabolic panel  Result Value Ref  Range   Sodium 138 135 - 145 mmol/L   Potassium 4.2 3.5 - 5.1 mmol/L   Chloride 95 (L) 98 - 111 mmol/L   CO2 31 22 - 32 mmol/L   Glucose, Bld 136 (H) 70 - 99 mg/dL   BUN 12 8 - 23 mg/dL   Creatinine, Ser 0.95 0.61 - 1.24 mg/dL   Calcium 9.2 8.9 - 10.3 mg/dL   GFR, Estimated >60 >60 mL/min   Anion gap 12 5 - 15  CBC  Result Value Ref Range   WBC 15.1 (H) 4.0 - 10.5 K/uL   RBC 5.20 4.22 - 5.81 MIL/uL   Hemoglobin 13.6 13.0 - 17.0 g/dL   HCT 44.1 39.0 - 52.0 %   MCV 84.8 80.0 - 100.0 fL   MCH 26.2 26.0 - 34.0 pg   MCHC 30.8 30.0 - 36.0 g/dL   RDW 14.9 11.5 - 15.5 %   Platelets 279 150 - 400 K/uL   nRBC 0.0 0.0 - 0.2 %   DG Chest Portable 1 View  Result Date: 01/25/2023 CLINICAL DATA:  Shortness of breath and chest pain. EXAM: PORTABLE CHEST 1 VIEW COMPARISON:  January 16, 2023 FINDINGS: Cardiomediastinal silhouette is normal. Mediastinal contours appear intact. There is no evidence of focal airspace consolidation, pleural effusion or pneumothorax. Osseous structures are without acute abnormality. Soft tissues are grossly normal. IMPRESSION: No active disease. Electronically Signed   By: Fidela Salisbury M.D.   On: 01/25/2023 15:44   DG Chest Port 1 View  Result Date: 01/16/2023 CLINICAL DATA:  Shortness of breath, history COPD, worsening symptoms since beginning of January following the flu EXAM: PORTABLE CHEST 1 VIEW COMPARISON:  Portable exam 1130 hours compared to 07/16/2022 FINDINGS: Normal heart size, mediastinal contours, and  pulmonary vascularity. Atherosclerotic calcification aorta. Lungs emphysematous but clear. No pulmonary infiltrate, pleural effusion, or pneumothorax. Bones demineralized. IMPRESSION: Emphysematous changes without acute infiltrate. Aortic Atherosclerosis (ICD10-I70.0) and Emphysema (ICD10-J43.9). Electronically Signed   By: Lavonia Dana M.D.   On: 01/16/2023 12:00      EKG EKG Interpretation  Date/Time:  Saturday January 25 2023 15:03:58 EST Ventricular Rate:  143 PR Interval:    QRS Duration: 78 QT Interval:  269 QTC Calculation: 415 R Axis:   72 Text Interpretation: Atrial fibrillation with rapid V-rate Confirmed by Lajean Saver [phone]) on 01/25/2023 3:11:03 PM  Radiology DG Chest Portable 1 View  Result Date: 01/25/2023 CLINICAL DATA:  Shortness of breath and chest pain. EXAM: PORTABLE CHEST 1 VIEW COMPARISON:  January 16, 2023 FINDINGS: Cardiomediastinal silhouette is normal. Mediastinal contours appear intact. There is no evidence of focal airspace consolidation, pleural effusion or pneumothorax. Osseous structures are without acute abnormality. Soft tissues are grossly normal. IMPRESSION: No active disease. Electronically Signed   By: Fidela Salisbury M.D.   On: 01/25/2023 15:44    Procedures Procedures    Medications Ordered in ED Medications  diltiazem (CARDIZEM) 1 mg/mL load via infusion 20 mg (20 mg Intravenous Bolus from Bag 01/25/23 1542)    And  diltiazem (CARDIZEM) 125 mg in dextrose 5% 125 mL (1 mg/mL) infusion (5 mg/hr Intravenous New Bag/Given 01/25/23 1541)  hydrocortisone sodium succinate (SOLU-CORTEF) 100 MG injection 100 mg (100 mg Intravenous Given 01/25/23 1543)  albuterol (PROVENTIL) (2.5 MG/3ML) 0.083% nebulizer solution 5 mg (5 mg Nebulization Given 01/25/23 1543)  ipratropium (ATROVENT) nebulizer solution 0.5 mg (0.5 mg Nebulization Given 01/25/23 1543)    ED Course/ Medical Decision Making/ A&P  Medical Decision Making Problems  Addressed: Atrial fibrillation with rapid ventricular response ([place]): acute illness or injury with systemic symptoms that poses a threat to life or bodily functions Chest tightness: acute illness or injury with systemic symptoms that poses a threat to life or bodily functions COPD exacerbation ([place]): acute illness or injury with systemic symptoms that poses a threat to life or bodily functions Shortness of breath: acute illness or injury  Amount and/or Complexity of Data Reviewed Independent Historian: spouse    Details: hx External Data Reviewed: labs, radiology and notes. Labs: ordered. Decision-making details documented in ED Course. Radiology: ordered and independent interpretation performed. Decision-making details documented in ED Course. ECG/medicine tests: ordered and independent interpretation performed. Decision-making details documented in ED Course.  Risk Prescription drug management. Decision regarding hospitalization.   Iv ns. Continuous pulse ox and cardiac monitoring. Labs ordered/sent. Imaging ordered.   Differential diagnosis includes afib rvr, copd exacerbation, etc . Dispo decision including potential need for admission considered - will get labs and imaging and reassess.   Reviewed nursing notes and prior charts for additional history. External reports reviewed. Additional history from: EMS/family.  Cardiac monitor: afib rate 150  Labs reviewed/interpreted by me - chem normal.   Xrays reviewed/interpreted by me - no pna.   Cardizem bolus and gtt. Gtt titrated.   Pt on chronic steroids, acute illness - solucortef iv. +wheezing. Albuterol and atrovent neb tx.   Wheezing improved from initial.  Rate initially improved, 100. On recheck, rate varies 100-130 - gtt titrated.   Hospitalists consulted for admission.  CRITICAL CARE RE :atrial fib with rapid ventricular response, copd exacerbation/wheezing,  Performed by: Mirna Mires Total critical care time:  40 minutes Critical care time was exclusive of separately billable procedures and treating other patients. Critical care was necessary to treat or prevent imminent or life-threatening deterioration. Critical care was time spent personally by me on the following activities: development of treatment plan with patient and/or surrogate as well as nursing, discussions with consultants, evaluation of patient's response to treatment, examination of patient, obtaining history from patient or surrogate, ordering and performing treatments and interventions, ordering and review of laboratory studies, ordering and review of radiographic studies, pulse oximetry and re-evaluation of patient's condition.          Final Clinical Impression(s) / ED Diagnoses Final diagnoses:  None    Rx / DC Orders ED Discharge Orders     None         Lajean Saver, MD 01/25/23 1750

## 2023-01-26 ENCOUNTER — Inpatient Hospital Stay (HOSPITAL_COMMUNITY): Payer: Medicare Other

## 2023-01-26 DIAGNOSIS — I4891 Unspecified atrial fibrillation: Secondary | ICD-10-CM | POA: Diagnosis not present

## 2023-01-26 DIAGNOSIS — R609 Edema, unspecified: Secondary | ICD-10-CM

## 2023-01-26 DIAGNOSIS — I480 Paroxysmal atrial fibrillation: Secondary | ICD-10-CM

## 2023-01-26 DIAGNOSIS — R7989 Other specified abnormal findings of blood chemistry: Secondary | ICD-10-CM

## 2023-01-26 LAB — COMPREHENSIVE METABOLIC PANEL
ALT: 43 U/L (ref 0–44)
AST: 32 U/L (ref 15–41)
Albumin: 3 g/dL — ABNORMAL LOW (ref 3.5–5.0)
Alkaline Phosphatase: 58 U/L (ref 38–126)
Anion gap: 12 (ref 5–15)
BUN: 18 mg/dL (ref 8–23)
CO2: 29 mmol/L (ref 22–32)
Calcium: 8.6 mg/dL — ABNORMAL LOW (ref 8.9–10.3)
Chloride: 95 mmol/L — ABNORMAL LOW (ref 98–111)
Creatinine, Ser: 0.91 mg/dL (ref 0.61–1.24)
GFR, Estimated: >60 mL/min (ref >60)
Glucose, Bld: 241 mg/dL — ABNORMAL HIGH (ref 70–99)
Potassium: 4.4 mmol/L (ref 3.5–5.1)
Sodium: 136 mmol/L (ref 135–145)
Total Bilirubin: 0.3 mg/dL (ref 0.3–1.2)
Total Protein: 5.4 g/dL — ABNORMAL LOW (ref 6.5–8.1)

## 2023-01-26 LAB — ECHOCARDIOGRAM COMPLETE
AR max vel: 3.37 cm2
AV Area VTI: 2.83 cm2
AV Area mean vel: 3.08 cm2
AV Mean grad: 1 mmHg
AV Peak grad: 2.6 mmHg
Ao pk vel: 0.8 m/s
Area-P 1/2: 3.66 cm2
Height: 66 in
S' Lateral: 2.6 cm
Weight: 2585.6 oz

## 2023-01-26 LAB — CBC
HCT: 37.7 % — ABNORMAL LOW (ref 39.0–52.0)
Hemoglobin: 11.6 g/dL — ABNORMAL LOW (ref 13.0–17.0)
MCH: 25.9 pg — ABNORMAL LOW (ref 26.0–34.0)
MCHC: 30.8 g/dL (ref 30.0–36.0)
MCV: 84.2 fL (ref 80.0–100.0)
Platelets: 224 10*3/uL (ref 150–400)
RBC: 4.48 MIL/uL (ref 4.22–5.81)
RDW: 15.1 % (ref 11.5–15.5)
WBC: 11.8 10*3/uL — ABNORMAL HIGH (ref 4.0–10.5)
nRBC: 0 % (ref 0.0–0.2)

## 2023-01-26 LAB — GLUCOSE, CAPILLARY
Glucose-Capillary: 145 mg/dL — ABNORMAL HIGH (ref 70–99)
Glucose-Capillary: 114 mg/dL — ABNORMAL HIGH (ref 70–99)
Glucose-Capillary: 125 mg/dL — ABNORMAL HIGH (ref 70–99)
Glucose-Capillary: 156 mg/dL — ABNORMAL HIGH (ref 70–99)
Glucose-Capillary: 187 mg/dL — ABNORMAL HIGH (ref 70–99)

## 2023-01-26 LAB — MAGNESIUM: Magnesium: 2 mg/dL (ref 1.7–2.4)

## 2023-01-26 LAB — PROCALCITONIN: Procalcitonin: <0.1 ng/mL

## 2023-01-26 LAB — PREALBUMIN: Prealbumin: 26 mg/dL (ref 18–38)

## 2023-01-26 LAB — PHOSPHORUS: Phosphorus: 4.3 mg/dL (ref 2.5–4.6)

## 2023-01-26 MED ORDER — BUDESON-GLYCOPYRROL-FORMOTEROL 160-9-4.8 MCG/ACT IN AERO: 2.0000 | INHALATION_SPRAY | Freq: Two times a day (BID) | RESPIRATORY_TRACT | Status: DC

## 2023-01-26 MED ORDER — UMECLIDINIUM BROMIDE 62.5 MCG/ACT IN AEPB
1.0000 | INHALATION_SPRAY | Freq: Every day | RESPIRATORY_TRACT | Status: DC
  Administered 2023-01-26 – 2023-01-28 (×3): 1 via RESPIRATORY_TRACT
  Filled 2023-01-26: qty 7

## 2023-01-26 MED ORDER — FERROUS SULFATE 325 (65 FE) MG PO TABS
325.0000 mg | ORAL_TABLET | Freq: Two times a day (BID) | ORAL | Status: DC
  Administered 2023-01-26 – 2023-01-28 (×4): 325 mg via ORAL
  Filled 2023-01-26 (×4): qty 1

## 2023-01-26 MED ORDER — FLUTICASONE FUROATE-VILANTEROL 200-25 MCG/ACT IN AEPB
1.0000 | INHALATION_SPRAY | Freq: Every day | RESPIRATORY_TRACT | Status: DC
  Administered 2023-01-26 – 2023-01-28 (×3): 1 via RESPIRATORY_TRACT
  Filled 2023-01-26: qty 28

## 2023-01-26 MED ORDER — PERFLUTREN LIPID MICROSPHERE
1.0000 mL | INTRAVENOUS | Status: AC | PRN
  Administered 2023-01-26: 3 mL via INTRAVENOUS

## 2023-01-26 MED ORDER — GUAIFENESIN-DM 100-10 MG/5ML PO SYRP
5.0000 mL | ORAL_SOLUTION | ORAL | Status: DC | PRN
  Administered 2023-01-26 – 2023-01-28 (×3): 5 mL via ORAL
  Filled 2023-01-26 (×3): qty 5

## 2023-01-26 MED ORDER — BISOPROLOL FUMARATE 5 MG PO TABS
5.0000 mg | ORAL_TABLET | Freq: Every day | ORAL | Status: DC
  Administered 2023-01-26: 5 mg via ORAL
  Filled 2023-01-26: qty 1

## 2023-01-26 NOTE — Assessment & Plan Note (Addendum)
Improved. Continue oxygen 2 L daily.

## 2023-01-26 NOTE — Progress Notes (Signed)
SATURATION QUALIFICATIONS: (This note is used to comply with regulatory documentation for home oxygen)  Patient Saturations on Room Air at Rest = 96%  Patient Saturations on Room Air while Ambulating = 87%  Patient Saturations on 1 Liters of oxygen while Ambulating = 92%  Please briefly explain why patient needs home oxygen: Pt required 1 L of supplemental O2 to maintain sats >/= 92% while ambulating.   Moishe Spice, PT, DPT Acute Rehabilitation Services  Office: [phone]

## 2023-01-26 NOTE — Consult Note (Signed)
Cardiology Consultation   Patient ID: Paul [name] MRN: [id number]; DOB: [birth year]  Admit date: 01/25/2023 Date of Consult: 01/26/2023  PCP:  [name], MD   [place] [facility] Cardiologist:  Candee Furbish, MD  Electrophysiologist:  Vickie Epley, MD       Patient Profile:   Paul [name] is a 77 y.o. male with a hx of paroxysmal atrial fibrillation, status post Watchman device in the setting of prior hematuria complications, hypertension, hypercholesteremia, prior cigaret use, and COPD  who is being seen 01/26/2023 for the evaluation of chest tightness concerning for COPD exacerbation and atrial fibrillation with rapid ventricular rate at the request of Hospital Medicine.  History of Present Illness:   Paul [name] states on Friday evening he started noticing a feeling of tightness in his chest, his heart racing, and shortness of breath. Along with shortness of breath was a productive cough and wheezing.  Patient reports that when he would check his pulse oximetry, it would read his heart rate more than 100 bpm.  He states that he knew that he was back in atrial fibrillation given similar symptoms in the past.  Due to ongoing symptoms that didn't improve the following morning, he decided to come to our emergency department for further evaluation.  During this time, he denies feeling lightheaded, fainting, or feeling like he was going to faint.    In the emergency department he was noted to be atrial fibrillation with rapid ventricular rate, with rate as high as 170 bpm.  Patient was placed on a diltiazem drip.  Chest CT angiography ruled out pulmonary embolism.  HS-troponin was normal.  Of note, patient was recently hospitalized for COPD exacerbation last week.  He was placed on therapies for acute worsening of his COPD.     Past Medical History:  Diagnosis Date   Anemia    Aortic atherosclerosis (HCC)    Aspiration pneumonia due to inhalation of vomitus ([place])  02/03/2022   Atrial fibrillation (HCC)    Back pain    COPD (chronic obstructive pulmonary disease) (HCC)    COPD exacerbation ([place]) 12/27/2021   COVID-19 12/24/2020   Diabetes mellitus without complication (HCC)    Dizziness 04/08/2022   Elevated PSA    GAD (generalized anxiety disorder)    GERD (gastroesophageal reflux disease)    High cholesterol    Hypertension    Lingular pneumonia 01/05/2021   See cxr 01/06/20 rx with zpak/ cefipime x 5 days and then developed purulent sputum again 01/12/21 so rx levcaquin 500 mg daily x 7 days then return for cxr before more    Lung nodule < 6cm on CT 05/29/2016   Osteoporosis    Oxygen deficiency    Pneumonia    January 2022   Presence of Watchman left atrial appendage closure device 08/02/2021   s/p LAAO by Dr. Quentin Ore with a 20 mm Watchman FLX device   RSV bronchitis 01/27/2022   Vertebral compression fracture ([place]) 05/29/2019   T8 compression fracture noted on CT scan 05/28/2019 inpatient with chronic steroid dependent COPD - rx calcitonin nasal spray rx per PCP     Past Surgical History:  Procedure Laterality Date   CATARACT EXTRACTION Right    ELBOW SURGERY  07/2022   surgery on olecranon bursa. Dr. Greta Doom   IR KYPHO EA ADDL LEVEL THORACIC OR LUMBAR  11/28/2021   LEFT ATRIAL APPENDAGE OCCLUSION N/A 08/02/2021   Procedure: LEFT ATRIAL APPENDAGE OCCLUSION;  Surgeon: Sherren Mocha, MD;  Location: [facility];  Service: Cardiovascular;  Laterality: N/A;   SKIN SURGERY     shoulders and chest   TEE WITHOUT CARDIOVERSION N/A 08/02/2021   Procedure: TRANSESOPHAGEAL ECHOCARDIOGRAM (TEE);  Surgeon: Sherren Mocha, MD;  Location: [place];  Service: Cardiovascular;  Laterality: N/A;   TEE WITHOUT CARDIOVERSION N/A 09/13/2021   Procedure: TRANSESOPHAGEAL ECHOCARDIOGRAM (TEE);  Surgeon: Sanda Klein, MD;  Location: [facility] END[facility];  Service: Cardiovascular;  Laterality: N/A;     Home Medications:  Prior to Admission  medications   Medication Sig Start Date End Date Taking? Authorizing Provider  acetaminophen (TYLENOL) 500 MG tablet Take 1,000 mg by mouth at bedtime as needed (pain).    [provider]  albuterol (PROAIR HFA) 108 (90 Base) MCG/ACT inhaler Inhale 2 puffs into the lungs as needed for wheezing or shortness of breath.    [provider]  albuterol (PROVENTIL) (2.5 MG/3ML) 0.083% nebulizer solution Take 3 mLs (2.5 mg total) by nebulization every 6 (six) hours as needed for shortness of breath or wheezing. 10/23/22   Parrett, Fonnie Mu, NP  ALPRAZolam (XANAX) 0.5 MG tablet Take 1 tablet (0.5 mg total) by mouth 2 (two) times daily as needed for anxiety. 07/25/22   CoxElnita Maxwell, MD  Ascorbic Acid (VITAMIN C) 1000 MG tablet Take 1,000 mg by mouth daily.    [provider]  aspirin EC 81 MG tablet Take 1 tablet (81 mg total) by mouth daily. Swallow whole. 01/28/22   Tommie Raymond, NP  atorvastatin (LIPITOR) 40 MG tablet TAKE 1 TABLET BY MOUTH EVERYDAY AT BEDTIME Patient taking differently: Take 40 mg by mouth at bedtime. 11/26/22   Marge Duncans, PA-C  Budeson-Glycopyrrol-Formoterol (BREZTRI AEROSPHERE) 160-9-4.8 MCG/ACT AERO Inhale 2 puffs into the lungs 2 (two) times daily. 01/02/23   Cox, Elnita Maxwell, MD  calcium carbonate (OS-CAL) 600 MG TABS tablet Take 600 mg by mouth daily.    [provider]  Cholecalciferol (VITAMIN D3) 50 MCG (2000 UT) TABS Take 2,000 Units by mouth in the morning.    [provider]  denosumab (PROLIA) 60 MG/ML SOSY injection Inject 60 mg into the skin every 6 (six) months.    [provider]  ferrous sulfate 325 (65 FE) MG tablet Take 325 mg by mouth 2 (two) times daily with a meal.    [provider]  fluticasone (FLONASE) 50 MCG/ACT nasal spray Place 2 sprays into both nostrils daily as needed for allergies or rhinitis.    [provider]  guaiFENesin (MUCINEX) 600 MG 12 hr tablet Take 2 tablets (1,200 mg total) by  mouth 2 (two) times daily. Patient taking differently: Take 600 mg by mouth 2 (two) times daily. 07/21/22   Regalado, Belkys A, MD  loratadine (CLARITIN) 10 MG tablet Take 10 mg by mouth in the morning.    [provider]  Melatonin 12 MG TABS Take 12 mg by mouth at bedtime.    [provider]  Multiple Vitamin (MULTIVITAMIN WITH MINERALS) TABS tablet Take 1 tablet by mouth daily with breakfast.    [provider]  OXYGEN Inhale 2 L/min into the lungs at bedtime.    [provider]  pantoprazole (PROTONIX) 40 MG tablet Take 1 tablet (40 mg total) by mouth 2 (two) times daily. 11/06/22   Tanda Rockers, MD  predniSONE (DELTASONE) 10 MG tablet Takes  4 tablets for 1 days, then 3 tablets for 1 days, then 2 tabs for 1 days, then 1  tab for 1 days, and then stop. 01/19/23   Charlynne Cousins, MD  Probiotic Product (PROBIOTIC DAILY) CAPS Take 1 capsule by mouth in the morning.    [provider]  sertraline (ZOLOFT) 50 MG tablet Take 1 tablet (50 mg total) by mouth at bedtime. 09/16/22   [name], MD    Inpatient Medications: Scheduled Meds:  aspirin EC  81 mg Oral Daily   atorvastatin  40 mg Oral QHS   guaiFENesin  600 mg Oral BID   loratadine  10 mg Oral q AM   melatonin  12 mg Oral QHS   methylPREDNISolone (SOLU-MEDROL) injection  40 mg Intravenous Q12H   Followed by   Derrill Memo ON 01/27/2023] predniSONE  40 mg Oral Q breakfast   pantoprazole  40 mg Oral BID   sertraline  50 mg Oral QHS   sodium chloride flush  3 mL Intravenous Q12H   Continuous Infusions:  sodium chloride Stopped (01/25/23 2229)   cefTRIAXone (ROCEPHIN)  IV Stopped (01/25/23 2210)   diltiazem (CARDIZEM) infusion 7.5 mg/hr (01/26/23 0224)   PRN Meds: sodium chloride, acetaminophen **OR** acetaminophen, ALPRAZolam, HYDROcodone-acetaminophen, iohexol, levalbuterol, mouth rinse, sodium chloride flush  Allergies:    Allergies  Allergen Reactions   Tape Other (See Comments)     PLEASE USE COBAN WRAP IN LIEU OF "TAPE," as it "pulls off the skin!!   Daliresp [Roflumilast] Diarrhea and Nausea And Vomiting   Levaquin [Levofloxacin] Palpitations and Other (See Comments)    Made the B/P fluctuate and heart raced   Alendronate Anxiety and Other (See Comments)    Jittery/nervous    Social History:   Social History   Socioeconomic History   Marital status: Married    Spouse name: Not on file   Number of children: 3   Years of education: Not on file   Highest education level: Not on file  Occupational History   Occupation: retired    Comment: truck Geophysicist/field seismologist  Tobacco Use   Smoking status: Former    Packs/day: 2.00    Years: 52.00    Total pack years: 104.00    Types: Cigarettes    Quit date: 02/03/2009    Years since quitting: 13.9   Smokeless tobacco: Never   Tobacco comments:    Counseled to remain smoke free  Vaping Use   Vaping Use: Never used  Substance and Sexual Activity   Alcohol use: Not Currently    Alcohol/week: 0.0 standard drinks of alcohol    Comment: occ   Drug use: No   Sexual activity: Not on file  Other Topics Concern   Not on file  Social History Narrative   Originally from [place]. Previously worked driving a Restaurant manager, fast food. No pets currently. No mold exposure. 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       wears sunscreen, brushes and flosses daily, see's dentist bi-annually, has smoke/carbon monoxide detectors, wears a seatbelt and practices gun safety      Social Determinants of Health   Financial Resource Strain: Medium Risk (10/31/2022)   Overall Financial Resource Strain (CARDIA)    Difficulty of Paying Living Expenses: Somewhat hard  Food Insecurity: No Food Insecurity (01/25/2023)   Hunger Vital Sign    Worried About Running Out of Food in the Last Year: Never true    Ran Out of Food in the Last Year: Never true  Transportation Needs: No Transportation Needs  (01/25/2023)  PRAPARE - Hydrologist (Medical): No    Lack of Transportation (Non-Medical): No  Physical Activity: Insufficiently Active (02/15/2022)   Exercise Vital Sign    Days of Exercise per Week: 7 days    Minutes of Exercise per Session: 10 min  Stress: No Stress Concern Present (02/15/2022)   [place]    Feeling of Stress : Only a little  Social Connections: Socially Integrated (02/15/2022)   Social Connection and Isolation Panel [NHANES]    Frequency of Communication with Friends and Family: More than three times a week    Frequency of Social Gatherings with Friends and Family: More than three times a week    Attends Religious Services: More than 4 times per year    Active Member of [employer] or Organizations: Yes    Attends Music therapist: More than 4 times per year    Marital Status: Married  Human resources officer Violence: Not At Risk (01/25/2023)   Humiliation, Afraid, Rape, and Kick questionnaire    Fear of Current or Ex-Partner: No    Emotionally Abused: No    Physically Abused: No    Sexually Abused: No    Family History:   Family History  Problem Relation Age of Onset   Heart disease Brother    Heart disease Mother    Heart disease Father    Cancer Paternal Uncle      ROS:  Please see the history of present illness.  All other ROS reviewed and negative.     Physical Exam/Data:   Vitals:   01/25/23 1941 01/25/23 2000 01/25/23 2104 01/26/23 0336  BP:  (!) 142/82 109/68 123/63  Pulse:  99 (!) 53 89  Resp:  (!) 22 (!) 23 (!) 22  Temp: 98.2 F (36.8 C)  (!) 97.3 F (36.3 C) 98.2 F (36.8 C)  TempSrc: Oral  Oral Oral  SpO2:  100% 96% 99%  Weight:   73.3 kg   Height:   '5\' 6"'$  (1.676 m)     Intake/Output Summary (Last 24 hours) at 01/26/2023 0556 Last data filed at 01/26/2023 0224 Gross per 24 hour  Intake 469.28 ml  Output 350 ml  Net 119.28 ml       01/25/2023    9:04 PM 01/22/2023    1:38 PM 01/16/2023   11:14 AM  Last 3 Weights  Weight (lbs) 161 lb 9.6 oz 167 lb 162 lb  Weight (kg) 73.301 kg 75.751 kg 73.483 kg     Body mass index is 26.08 kg/m.  General:  Well nourished, tachypnea, well developed HEENT: normal Neck: no JVD Vascular: No carotid bruits; Distal pulses 2+ bilaterally Cardiac:  normal S1, S2; irregular; no murmur/gallops/rubs Lungs:  clear to auscultation bilaterally, no wheezing, rhonchi or rales  Abd: soft, nontender, no hepatomegaly  Ext: no edema Musculoskeletal:  No deformities, BUE and BLE strength normal and equal Skin: warm and dry  Neuro:  CNs 2-12 intact, no focal abnormalities noted Psych:  Normal affect   EKG:  The EKG was personally reviewed and demonstrates:  01/25/23 (15:03):  Atrial fibrillation with rapid ventricular rate Telemetry:  Telemetry was personally reviewed and demonstrates:  Atrial fibrillation with normal rate  Relevant CV Studies: # Echocardiogram 07/09/21: IMPRESSIONS   1. Left ventricular ejection fraction, by estimation, is 60 to 65%. Left  ventricular ejection fraction by 3D volume is 61 %. The left ventricle has  normal function. The left  ventricle has no regional wall motion  abnormalities. Left ventricular diastolic   parameters are consistent with Grade I diastolic dysfunction (impaired  relaxation).   2. Right ventricular systolic function is normal. The right ventricular  size is normal. There is normal pulmonary artery systolic pressure.   3. The mitral valve is normal in structure. No evidence of mitral valve  regurgitation. No evidence of mitral stenosis.   4. The aortic valve is tricuspid. There is mild thickening of the aortic  valve. Aortic valve regurgitation is not visualized. Mild aortic valve  sclerosis is present, with no evidence of aortic valve stenosis.   5. The inferior vena cava is normal in size with greater than 50%  respiratory variability, suggesting  right atrial pressure of 3 mmHg.   Laboratory Data:  High Sensitivity Troponin:   Recent Labs  Lab 01/25/23 1509 01/25/23 1953 01/25/23 2226  TROPONINIHS 27* 22* 17     Chemistry Recent Labs  Lab 01/22/23 1404 01/25/23 1509 01/25/23 1953 01/25/23 2000 01/26/23 0154  NA 138 138  --  137 136  K 5.7* 4.2  --  4.5 4.4  CL 93* 95*  --   --  95*  CO2 29 31  --   --  29  GLUCOSE 153* 136*  --   --  241*  BUN 14 12  --   --  18  CREATININE 0.72* 0.95  --   --  0.91  CALCIUM 9.6 9.2  --   --  8.6*  MG  --   --  2.1  --  2.0  GFRNONAA  --  >60  --   --  >60  ANIONGAP  --  12  --   --  12    Recent Labs  Lab 01/22/23 1404 01/25/23 1953 01/26/23 0154  PROT 6.6 6.1* 5.4*  ALBUMIN 4.4 3.2* 3.0*  AST 31 30 32  ALT 56* 48* 43  ALKPHOS 83 59 58  BILITOT 0.3 0.6 0.3   Lipids No results for input(s): "CHOL", "TRIG", "HDL", "LABVLDL", "LDLCALC", "CHOLHDL" in the last 168 hours.  Hematology Recent Labs  Lab 01/25/23 1509 01/25/23 2000 01/25/23 2226 01/26/23 0154  WBC 15.1*  --  11.4* 11.8*  RBC 5.20  --  4.57 4.48  HGB 13.6 12.9* 12.1* 11.6*  HCT 44.1 38.0* 37.4* 37.7*  MCV 84.8  --  81.8 84.2  MCH 26.2  --  26.5 25.9*  MCHC 30.8  --  32.4 30.8  RDW 14.9  --  15.0 15.1  PLT 279  --  228 224   Thyroid  Recent Labs  Lab 01/25/23 1953  TSH 0.792    BNPNo results for input(s): "BNP", "PROBNP" in the last 168 hours.  DDimer  Recent Labs  Lab 01/25/23 1509  DDIMER 2.03*     Radiology/Studies:  CT Angio Chest Pulmonary Embolism (PE) W or WO Contrast  Result Date: 01/25/2023 CLINICAL DATA:  Pulmonary embolism (PE) suspected, high prob EXAM: CT ANGIOGRAPHY CHEST WITH CONTRAST TECHNIQUE: Multidetector CT imaging of the chest was performed using the standard protocol during bolus administration of intravenous contrast. Multiplanar CT image reconstructions and MIPs were obtained to evaluate the vascular anatomy. RADIATION DOSE REDUCTION: This exam was performed according  to the departmental dose-optimization program which includes automated exposure control, adjustment of the mA and/or kV according to patient size and/or use of iterative reconstruction technique. CONTRAST:  60m OMNIPAQUE IOHEXOL 350 MG/ML SOLN COMPARISON:  07/17/2021 FINDINGS: Cardiovascular: No filling defects in  the pulmonary arteries to suggest pulmonary emboli. Heart is normal size. Aorta is normal caliber. Scattered coronary artery and aortic calcifications. Mediastinum/Nodes: No mediastinal, hilar, or axillary adenopathy. Trachea and esophagus are unremarkable. Thyroid unremarkable. Lungs/Pleura: Centrilobular emphysema. No confluent opacities or effusions. Upper Abdomen: No acute findings Musculoskeletal: Chest wall soft tissues are unremarkable. No acute bony abnormality. Review of the MIP images confirms the above findings. IMPRESSION: No evidence of pulmonary embolus. Coronary artery disease. Aortic Atherosclerosis (ICD10-I70.0) and Emphysema (ICD10-J43.9). Electronically Signed   By: Rolm Baptise M.D.   On: 01/25/2023 20:33   DG Chest Portable 1 View  Result Date: 01/25/2023 CLINICAL DATA:  Shortness of breath and chest pain. EXAM: PORTABLE CHEST 1 VIEW COMPARISON:  January 16, 2023 FINDINGS: Cardiomediastinal silhouette is normal. Mediastinal contours appear intact. There is no evidence of focal airspace consolidation, pleural effusion or pneumothorax. Osseous structures are without acute abnormality. Soft tissues are grossly normal. IMPRESSION: No active disease. Electronically Signed   By: Fidela Salisbury M.D.   On: 01/25/2023 15:44     Assessment and Plan:  Paul [name] is a 77 y.o. male with a hx of paroxysmal atrial fibrillation, status post Watchman device in the setting of prior hematuria complications, hypertension, hypercholesteremia, prior cigaret use, and COPD  who is being seen 01/26/2023 for the evaluation of chest tightness concerning for COPD exacerbation and atrial  fibrillation with rapid ventricular rate at the request of Hospital Medicine.  Patient is in atrial fibrillation with normal rate.  Atrial fibrillation: Known paroxysmal atrial fibrillation now with rapid ventricular rate.  Patient with noted palpitations and shortness of breath likely multifactorial (atrial fibrillation and COPD exacerbation).  He is status post Watchman device placement due to bleeding complications prior, no longer taking oral anticoagulation or antiplatelet therapy.  CHA2DS2 VASc score is 4, moderate risk for thrombo-embolism.  Currently on a diltiazem drip. --Reasonable to titrate down diltiazem and start diltiazem PO initially at '120mg'$  daily and titrate up as needed to achieve rate control.   Risk Assessment/Risk Scores:          CHA2DS2-VASc Score =   4      For questions or updates, please contact [place] Please consult [email or web address] for contact info under    Signed, Jon Billings, MD  01/26/2023 5:56 AM

## 2023-01-26 NOTE — Progress Notes (Signed)
PT Cancellation Note  Patient Details Name: Paul [name] MRN: [id number] DOB: [birth year]   Cancelled Treatment:    Reason Eval/Treat Not Completed: Patient at procedure or test/unavailable. Will plan to follow-up later as time permits.   Moishe Spice, PT, DPT Acute Rehabilitation Services  Office: [place] 01/26/2023, 1:25 PM

## 2023-01-26 NOTE — Progress Notes (Signed)
  Echocardiogram 2D Echocardiogram has been performed.  Paul [name] 01/26/2023, 1:53 PM

## 2023-01-26 NOTE — Progress Notes (Signed)
Rounding Note    Patient Name: Paul [name] Date of Encounter: 01/26/2023   [facility] Cardiologist: Candee Furbish, MD lambert  Subjective   "I feel better."  Inpatient Medications    Scheduled Meds:  aspirin EC  81 mg Oral Daily   atorvastatin  40 mg Oral QHS   bisoprolol  5 mg Oral Daily   ferrous sulfate  325 mg Oral BID WC   fluticasone furoate-vilanterol  1 puff Inhalation Daily   And   umeclidinium bromide  1 puff Inhalation Daily   guaiFENesin  600 mg Oral BID   loratadine  10 mg Oral q AM   melatonin  12 mg Oral QHS   pantoprazole  40 mg Oral BID   [START ON 01/27/2023] predniSONE  40 mg Oral Q breakfast   sertraline  50 mg Oral QHS   sodium chloride flush  3 mL Intravenous Q12H   Continuous Infusions:  sodium chloride Stopped (01/25/23 2229)   cefTRIAXone (ROCEPHIN)  IV Stopped (01/25/23 2210)   PRN Meds: sodium chloride, acetaminophen **OR** acetaminophen, ALPRAZolam, HYDROcodone-acetaminophen, iohexol, levalbuterol, mouth rinse, sodium chloride flush   Vital Signs    Vitals:   01/26/23 0336 01/26/23 0755 01/26/23 1100 01/26/23 1121  BP: 123/63 133/65    Pulse: 89 88 95   Resp: (!) '22 20 19   '$ Temp: 98.2 F (36.8 C) 98.3 F (36.8 C) 98.2 F (36.8 C)   TempSrc: Oral Oral Oral   SpO2: 99% 99% 100% 99%  Weight:      Height:        Intake/Output Summary (Last 24 hours) at 01/26/2023 1128 Last data filed at 01/26/2023 1110 Gross per 24 hour  Intake 520.59 ml  Output 600 ml  Net -79.41 ml      01/25/2023    9:04 PM 01/22/2023    1:38 PM 01/16/2023   11:14 AM  Last 3 Weights  Weight (lbs) 161 lb 9.6 oz 167 lb 162 lb  Weight (kg) 73.301 kg 75.751 kg 73.483 kg      Telemetry    nsr - Personally Reviewed  ECG    nsr - Personally Reviewed  Physical Exam   GEN: No acute distress.   Neck: No JVD Cardiac: RRR, no murmurs, rubs, or gallops.  Respiratory: Clear to auscultation bilaterally. GI: Soft, nontender, non-distended  MS: No  edema; No deformity. Neuro:  Nonfocal  Psych: Normal affect   Labs    High Sensitivity Troponin:   Recent Labs  Lab 01/25/23 1509 01/25/23 1953 01/25/23 2226  TROPONINIHS 27* 22* 17     Chemistry Recent Labs  Lab 01/22/23 1404 01/25/23 1509 01/25/23 1953 01/25/23 2000 01/26/23 0154  NA 138 138  --  137 136  K 5.7* 4.2  --  4.5 4.4  CL 93* 95*  --   --  95*  CO2 29 31  --   --  29  GLUCOSE 153* 136*  --   --  241*  BUN 14 12  --   --  18  CREATININE 0.72* 0.95  --   --  0.91  CALCIUM 9.6 9.2  --   --  8.6*  MG  --   --  2.1  --  2.0  PROT 6.6  --  6.1*  --  5.4*  ALBUMIN 4.4  --  3.2*  --  3.0*  AST 31  --  30  --  32  ALT 56*  --  48*  --  43  ALKPHOS 83  --  59  --  65  BILITOT 0.3  --  0.6  --  0.3  GFRNONAA  --  >60  --   --  >60  ANIONGAP  --  12  --   --  12    Lipids No results for input(s): "CHOL", "TRIG", "HDL", "LABVLDL", "LDLCALC", "CHOLHDL" in the last 168 hours.  Hematology Recent Labs  Lab 01/25/23 1509 01/25/23 2000 01/25/23 2226 01/26/23 0154  WBC 15.1*  --  11.4* 11.8*  RBC 5.20  --  4.57 4.48  HGB 13.6 12.9* 12.1* 11.6*  HCT 44.1 38.0* 37.4* 37.7*  MCV 84.8  --  81.8 84.2  MCH 26.2  --  26.5 25.9*  MCHC 30.8  --  32.4 30.8  RDW 14.9  --  15.0 15.1  PLT 279  --  228 224   Thyroid  Recent Labs  Lab 01/25/23 1953  TSH 0.792    BNPNo results for input(s): "BNP", "PROBNP" in the last 168 hours.  DDimer  Recent Labs  Lab 01/25/23 1509  DDIMER 2.03*     Radiology    CT Angio Chest Pulmonary Embolism (PE) W or WO Contrast  Result Date: 01/25/2023 CLINICAL DATA:  Pulmonary embolism (PE) suspected, high prob EXAM: CT ANGIOGRAPHY CHEST WITH CONTRAST TECHNIQUE: Multidetector CT imaging of the chest was performed using the standard protocol during bolus administration of intravenous contrast. Multiplanar CT image reconstructions and MIPs were obtained to evaluate the vascular anatomy. RADIATION DOSE REDUCTION: This exam was performed  according to the departmental dose-optimization program which includes automated exposure control, adjustment of the mA and/or kV according to patient size and/or use of iterative reconstruction technique. CONTRAST:  32m OMNIPAQUE IOHEXOL 350 MG/ML SOLN COMPARISON:  07/17/2021 FINDINGS: Cardiovascular: No filling defects in the pulmonary arteries to suggest pulmonary emboli. Heart is normal size. Aorta is normal caliber. Scattered coronary artery and aortic calcifications. Mediastinum/Nodes: No mediastinal, hilar, or axillary adenopathy. Trachea and esophagus are unremarkable. Thyroid unremarkable. Lungs/Pleura: Centrilobular emphysema. No confluent opacities or effusions. Upper Abdomen: No acute findings Musculoskeletal: Chest wall soft tissues are unremarkable. No acute bony abnormality. Review of the MIP images confirms the above findings. IMPRESSION: No evidence of pulmonary embolus. Coronary artery disease. Aortic Atherosclerosis (ICD10-I70.0) and Emphysema (ICD10-J43.9). Electronically Signed   By: KRolm BaptiseM.D.   On: 01/25/2023 20:33   DG Chest Portable 1 View  Result Date: 01/25/2023 CLINICAL DATA:  Shortness of breath and chest pain. EXAM: PORTABLE CHEST 1 VIEW COMPARISON:  January 16, 2023 FINDINGS: Cardiomediastinal silhouette is normal. Mediastinal contours appear intact. There is no evidence of focal airspace consolidation, pleural effusion or pneumothorax. Osseous structures are without acute abnormality. Soft tissues are grossly normal. IMPRESSION: No active disease. Electronically Signed   By: DFidela SalisburyM.D.   On: 01/25/2023 15:44    Cardiac Studies   None, echo pending  Patient Profile     77y.o. male admitted with atrial fib and an RVR in the setting of a copd flair  Assessment & Plan    Uncontrolled atrial fib - he has reverted back to NSR. He has been intolerant to beta blockers in the past. Hypotensive on cardizem.  I would ask him to see Dr. LQuentin Oreto consider  PVI as an outpatient. Coags - no indication for anti-coag s/p Watchman. COPD - note treatment.   CEldredwill sign off.   Medication Recommendations:  continue current meds Other recommendations (labs, testing, etc):  f/u Dr. Quentin Ore to discuss Watchman Follow up as an outpatient:  see above.  For questions or updates, please contact [place] Please consult [email or web address] for contact info under     Signed, Cristopher Peru, MD  01/26/2023, 11:28 AM

## 2023-01-26 NOTE — Evaluation (Addendum)
Physical Therapy Evaluation & Discharge Patient Details Name: Paul [name] MRN: [id number] DOB: 26-Nov-1946 Today's Date: 01/26/2023  History of Present Illness  Pt is 77 yo male who presented 01/25/23 with SOB and cough. Admitted with afib with RVR and acute COPD exacerbation. 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   Clinical Impression  Pt presents with condition above. PTA, he was independent without DME, living with his significant other in a 1-level house with a level entry. Pt reports he goes to the gym often. He reports he only uses supplemental O2 (up to 2L) when he is sleeping or doing heavy activity, but not for just ambulating around the house. Currently pt is requiring 1L of supplemental O2 to maintain his sats in the 90s% when ambulating. Pt appears to be at his baseline, demonstrating steady gait with no need for UE support or assistance. He reports feeling a little weak, therefore reviewed some exercises he can do to address it. He verbalized understanding and was in agreement that he did not need skilled PT at this time as he can perform these exercises on his own. Will defer pt to mobilize with nursing staff and mobility techs while here. All education completed and questions answered. PT will sign off.       Recommendations for follow up therapy are one component of a multi-disciplinary discharge planning process, led by the attending physician.  Recommendations may be updated based on patient status, additional functional criteria and insurance authorization.  Follow Up Recommendations No PT follow up      Assistance Recommended at Discharge PRN  Patient can return home with the following  Assistance with cooking/housework    Equipment Recommendations None recommended by PT  Recommendations for Other Services       Functional Status Assessment Patient has not had a  recent decline in their functional status     Precautions / Restrictions Precautions Precautions: Other (comment) Precaution Comments: watch HR & SpO2 (intermittently uses 2L O2 with heavy activity) Restrictions Weight Bearing Restrictions: No      Mobility  Bed Mobility               General bed mobility comments: Pt sitting up in chair upon arrival.    Transfers Overall transfer level: Modified independent Equipment used: None               General transfer comment: Able to come to stand safely without LOB    Ambulation/Gait Ambulation/Gait assistance: Modified independent (Device/Increase time) Gait Distance (Feet): 480 Feet Assistive device: None Gait Pattern/deviations: WFL(Within Functional Limits) Gait velocity: WFL Gait velocity interpretation: >4.37 ft/sec, indicative of normal walking speed   General Gait Details: Pt with steady gait and no significant gait deviations. No need for assistance.  Stairs            Wheelchair Mobility    Modified Rankin (Stroke Patients Only)       Balance Overall balance assessment: No apparent balance deficits (not formally assessed)                                           Pertinent Vitals/Pain Pain Assessment Pain Assessment: No/denies pain    Home Living Family/patient expects to be discharged to:: Private residence Living Arrangements: Spouse/significant other Available Help at  Discharge: Family;Available 24 hours/day Type of Home: House Home Access: Level entry       Home Layout: One level Home Equipment: Rollator (4 wheels);Shower seat;Grab bars - tub/shower Additional Comments: only uses supplemental O2 (up to 2L) when sleeping and with heavy activity    Prior Function Prior Level of Function : Independent/Modified Independent             Mobility Comments: Pt reports going to the gym and using the treadmill, no AD, no recent falls       Hand Dominance    Dominant Hand: Right    Extremity/Trunk Assessment   Upper Extremity Assessment Upper Extremity Assessment: Defer to OT evaluation    Lower Extremity Assessment Lower Extremity Assessment: Overall WFL for tasks assessed (MMT scores of 5 grossly; denied numbness/tingling)    Cervical / Trunk Assessment Cervical / Trunk Assessment: Normal  Communication   Communication: No difficulties  Cognition Arousal/Alertness: Awake/alert Behavior During Therapy: WFL for tasks assessed/performed Overall Cognitive Status: Within Functional Limits for tasks assessed                                          General Comments General comments (skin integrity, edema, etc.): SpO2 >/= 96% on RA at rest, down to 87% on RA with gait, >/= 92% on 1 L O2 with gait; monitor alarming Vtach and HR up to 180s very briefly but as soon as stopped walking to assess it his HR read 120s, did not appear to be true Vtach and pt denied chest discomfort, lightheadedness, etc - RN made aware; pt reports feeling a little weaker than usual, thus instructed pt on standing hip 3 way exercises, STS, and mini lunges to perform to improve strength. He verbalized understanding    Exercises     Assessment/Plan    PT Assessment Patient does not need any further PT services  PT Problem List         PT Treatment Interventions DME instruction;Gait training;Stair training;Functional mobility training;Therapeutic activities;Therapeutic exercise;Balance training;Neuromuscular re-education;Patient/family education    PT Goals (Current goals can be found in the Care Plan section)  Acute Rehab PT Goals Patient Stated Goal: to regain his strength PT Goal Formulation: All assessment and education complete, DC therapy Time For Goal Achievement: 01/27/23 Potential to Achieve Goals: Good    Frequency Min 1X/week     Co-evaluation               AM-PAC PT "6 Clicks" Mobility  Outcome Measure Help needed  turning from your back to your side while in a flat bed without using bedrails?: None Help needed moving from lying on your back to sitting on the side of a flat bed without using bedrails?: None Help needed moving to and from a bed to a chair (including a wheelchair)?: None Help needed standing up from a chair using your arms (e.g., wheelchair or bedside chair)?: None Help needed to walk in hospital room?: None Help needed climbing 3-5 steps with a railing? : None 6 Click Score: 24    End of Session Equipment Utilized During Treatment: Gait belt Activity Tolerance: Patient tolerated treatment well Patient left: in chair;with call bell/phone within reach Nurse Communication: Mobility status;Other (comment) (vitals) PT Visit Diagnosis: Muscle weakness (generalized) (M62.81)    Time: 1638-4665 PT Time Calculation (min) (ACUTE ONLY): 17 min   Charges:   PT Evaluation $PT  Eval Low Complexity: 1 Low          Moishe Spice, PT, DPT Acute Rehabilitation Services  Office: [phone]   Orvan Falconer 01/26/2023, 5:00 PM

## 2023-01-26 NOTE — Progress Notes (Signed)
Pt has PRN BIPAP orders, no distress noted at this time.  °

## 2023-01-26 NOTE — Progress Notes (Signed)
PROGRESS NOTE    Paul [name]  XIH:[id number] DOB: 11/17/1946 DOA: 01/25/2023 PCP: [name], MD   Brief Narrative:    Paul [name] is a 77 y.o. 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  Assessment & Plan:   Principal Problem:   Atrial fibrillation with RVR (HCC) Active Problems:   HLD (hyperlipidemia)   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HCC)   Chronic diastolic CHF (congestive heart failure) (HCC)   Chronic respiratory failure with hypoxia (HCC)   COPD with acute exacerbation (HCC)   Elevated d-dimer  Assessment and Plan:    Atrial fibrillation with RVR (HCC)-resolved Currently in sinus rhythm, diltiazem drip discontinued Was previously on bisoprolol 5 mg daily, restarted for now.  Monitor carefully while inpatient  Appreciate cardiology consult  Echocardiogram pending Pt is sp watchman, no need for anticoagulation TSH within normal limits PE study negative   COPD with acute exacerbation (HCC)-improving  -  - Will initiate: Steroid taper  -  Antibiotics rocephin -  XopenexPRN, - scheduled duoneb,  -  Breo or Dulera at discharge   -  Mucinex.  Titrate O2 to saturation >90%. Follow patients respiratory status.  VBG  -    -   BiPAP ordered PRN for increased work of breathing.  Currently mentating well no evidence of symptomatic hypercarbia    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[place]) Order sliding scale and diabetes coordinator consult   HLD (hyperlipidemia) Continue lipitor  40 mg po qday  Chronic respiratory failure with hypoxia (HCC) Continue oxygen 2L  Likely in the setting of copd   Chronic diastolic CHF (congestive heart failure) (HCC) Avoid fluid over load   Elevated d-dimer CTA neg for PE    DVT prophylaxis:SCDs Code Status: Full Family Communication: Daughter at bedside 2/4 Disposition Plan:  Status is: Inpatient Remains inpatient appropriate because: Need for IV medications  Consultants:  Cardiology  Procedures:  None  Antimicrobials:  Anti-infectives (From admission, onward)    Start     Dose/Rate Route Frequency Ordered Stop   01/25/23 2000  cefTRIAXone (ROCEPHIN) 1 g in sodium chloride 0.9 % 100 mL IVPB        1 g 200 mL/hr over 30 Minutes Intravenous Every 24 hours 01/25/23 1948 01/30/23 1959      Subjective: Patient seen and evaluated today with no new acute complaints or concerns. No acute concerns or events noted overnight.  He states he is starting to feel better with minimal shortness of breath or wheezing.  Objective: Vitals:   01/25/23 1941 01/25/23 2000 01/25/23 2104 01/26/23 0336  BP:  (!) 142/82 109/68 123/63  Pulse:  99 (!) 53 89  Resp:  (!) 22 (!) 23 (!) 22  Temp: 98.2 F (36.8 C)  (!) 97.3 F (36.3 C) 98.2 F (36.8 C)  TempSrc: Oral  Oral Oral  SpO2:  100% 96% 99%  Weight:   73.3 kg   Height:   '5\' 6"'$  (1.676 m)     Intake/Output Summary (Last 24 hours) at 01/26/2023  0745 Last data filed at 01/26/2023 0224 Gross per 24 hour  Intake 469.28 ml  Output 350 ml  Net 119.28 ml   Filed Weights   01/25/23 2104  Weight: 73.3 kg    Examination:  General exam: Appears calm and comfortable  Respiratory system: Clear to auscultation. Respiratory effort normal.  Currently on 2 L nasal cannula Cardiovascular system: S1 & S2 heard, RRR.  Gastrointestinal system: Abdomen is soft Central nervous system: Alert and awake Extremities: No edema Skin: No significant lesions noted Psychiatry: Flat  affect.    Data Reviewed: I have personally reviewed following labs and imaging studies  CBC: Recent Labs  Lab 01/22/23 1404 01/25/23 1509 01/25/23 2000 01/25/23 2226 01/26/23 0154  WBC 14.4* 15.1*  --  11.4* 11.8*  NEUTROABS 13.6*  --   --  10.3*  --   HGB 13.4 13.6 12.9* 12.1* 11.6*  HCT 42.5 44.1 38.0* 37.4* 37.7*  MCV 82 84.8  --  81.8 84.2  PLT 268 279  --  228 976   Basic Metabolic Panel: Recent Labs  Lab 01/22/23 1404 01/25/23 1509 01/25/23 1953 01/25/23 2000 01/26/23 0154  NA 138 138  --  137 136  K 5.7* 4.2  --  4.5 4.4  CL 93* 95*  --   --  95*  CO2 29 31  --   --  29  GLUCOSE 153* 136*  --   --  241*  BUN 14 12  --   --  18  CREATININE 0.72* 0.95  --   --  0.91  CALCIUM 9.6 9.2  --   --  8.6*  MG  --   --  2.1  --  2.0  PHOS  --   --  3.8  --  4.3   GFR: Estimated Creatinine Clearance: 61.3 mL/min (by C-G formula based on SCr of 0.91 mg/dL). Liver Function Tests: Recent Labs  Lab 01/22/23 1404 01/25/23 1953 01/26/23 0154  AST 31 30 32  ALT 56* 48* 43  ALKPHOS 83 59 58  BILITOT 0.3 0.6 0.3  PROT 6.6 6.1* 5.4*  ALBUMIN 4.4 3.2* 3.0*   No results for input(s): "LIPASE", "AMYLASE" in the last 168 hours. No results for input(s): "AMMONIA" in the last 168 hours. Coagulation Profile: Recent Labs  Lab 01/25/23 1953  INR 0.9   Cardiac Enzymes: Recent Labs  Lab 01/25/23 1953  CKTOTAL 56   BNP (last 3 results) No results for input(s): "PROBNP" in the last 8760 hours. HbA1C: No results for input(s): "HGBA1C" in the last 72 hours. CBG: Recent Labs  Lab 01/19/23 0753 01/19/23 1122 01/25/23 2118 01/26/23 0643  GLUCAP 83 79 157* 145*   Lipid Profile: No results for input(s): "CHOL", "HDL", "LDLCALC", "TRIG", "CHOLHDL", "LDLDIRECT" in the last 72 hours. Thyroid Function Tests: Recent Labs    01/25/23 1953  TSH 0.792   Anemia Panel: No results for input(s): "VITAMINB12", "FOLATE", "FERRITIN", "TIBC", "IRON", "RETICCTPCT" in the last  72 hours. Sepsis Labs: Recent Labs  Lab 01/25/23 1953 01/25/23 2226 01/26/23 0154  PROCALCITON <0.10  --  <0.10  LATICACIDVEN 1.5 1.6  --     Recent Results (from the past 240 hour(s))  Resp panel by RT-PCR (RSV, Flu A&B, Covid) Nasopharyngeal Swab     Status: None   Collection Time: 01/16/23 11:20 AM   Specimen: Nasopharyngeal Swab; Nasal Swab  Result Value Ref Range Status   SARS Coronavirus 2 by RT PCR NEGATIVE NEGATIVE Final    Comment: (NOTE)  SARS-CoV-2 target nucleic acids are NOT DETECTED.  The SARS-CoV-2 RNA is generally detectable in upper respiratory specimens during the acute phase of infection. The lowest concentration of SARS-CoV-2 viral copies this assay can detect is 138 copies/mL. A negative result does not preclude SARS-Cov-2 infection and should not be used as the sole basis for treatment or other patient management decisions. A negative result may occur with  improper specimen collection/handling, submission of specimen other than nasopharyngeal swab, presence of viral mutation(s) within the areas targeted by this assay, and inadequate number of viral copies(<138 copies/mL). A negative result must be combined with clinical observations, patient history, and epidemiological information. The expected result is Negative.  Fact Sheet for Patients:  EntrepreneurPulse.com.au  Fact Sheet for Healthcare Providers:  IncredibleEmployment.be  This test is no t yet approved or cleared by the [place] FDA and  has been authorized for detection and/or diagnosis of SARS-CoV-2 by FDA under an Emergency Use Authorization (EUA). This EUA will remain  in effect (meaning this test can be used) for the duration of the COVID-19 declaration under Section 564(b)(1) of the Act, 21 U.S.C.section 360bbb-3(b)(1), unless the authorization is terminated  or revoked sooner.       Influenza A by PCR NEGATIVE NEGATIVE Final   Influenza B by  PCR NEGATIVE NEGATIVE Final    Comment: (NOTE) The Xpert Xpress SARS-CoV-2/FLU/RSV plus assay is intended as an aid in the diagnosis of influenza from Nasopharyngeal swab specimens and should not be used as a sole basis for treatment. Nasal washings and aspirates are unacceptable for Xpert Xpress SARS-CoV-2/FLU/RSV testing.  Fact Sheet for Patients: EntrepreneurPulse.com.au  Fact Sheet for Healthcare Providers: IncredibleEmployment.be  This test is not yet approved or cleared by the [place] FDA and has been authorized for detection and/or diagnosis of SARS-CoV-2 by FDA under an Emergency Use Authorization (EUA). This EUA will remain in effect (meaning this test can be used) for the duration of the COVID-19 declaration under Section 564(b)(1) of the Act, 21 U.S.C. section 360bbb-3(b)(1), unless the authorization is terminated or revoked.     Resp Syncytial Virus by PCR NEGATIVE NEGATIVE Final    Comment: (NOTE) Fact Sheet for Patients: EntrepreneurPulse.com.au  Fact Sheet for Healthcare Providers: IncredibleEmployment.be  This test is not yet approved or cleared by the [place] FDA and has been authorized for detection and/or diagnosis of SARS-CoV-2 by FDA under an Emergency Use Authorization (EUA). This EUA will remain in effect (meaning this test can be used) for the duration of the COVID-19 declaration under Section 564(b)(1) of the Act, 21 U.S.C. section 360bbb-3(b)(1), unless the authorization is terminated or revoked.  Performed at [facility], [address] [place], [place] [address]   Expectorated Sputum Assessment w Gram Stain, Rflx to Resp Cult     Status: None   Collection Time: 01/16/23  7:51 PM   Specimen: Expectorated Sputum  Result Value Ref Range Status   Specimen Description EXPECTORATED SPUTUM  Final   Special Requests NONE  Final   Sputum evaluation   Final     THIS SPECIMEN IS ACCEPTABLE FOR SPUTUM CULTURE Performed at [facility], [place] [address], [place], [place] [address]    Report Status 01/17/2023 FINAL  Final  Culture, Respiratory w Gram Stain     Status: None   Collection Time: 01/16/23  7:51 PM  Result Value Ref Range Status   Specimen Description   Final    EXPECTORATED SPUTUM Performed at [facility], [place] [address] [place], [place] [address]    Special Requests   Final    NONE Reflexed from [phone] Performed at [facility], [place] [address] [place], [place] [address]    Gram Stain   Final    RARE WBC PRESENT, PREDOMINANTLY PMN NO ORGANISMS SEEN    Culture   Final    RARE Normal respiratory flora-no Staph aureus or Pseudomonas seen Performed at [place] [address] [place], [place] [address]    Report Status 01/20/2023 FINAL  Final  Resp panel by RT-PCR (RSV, Flu A&B, Covid) Anterior Nasal Swab     Status: None   Collection Time: 01/25/23  6:00 PM   Specimen: Anterior Nasal Swab  Result Value Ref Range Status   SARS Coronavirus 2 by RT PCR NEGATIVE NEGATIVE Final   Influenza A by PCR NEGATIVE NEGATIVE Final   Influenza B by PCR NEGATIVE NEGATIVE Final    Comment: (NOTE) The Xpert Xpress SARS-CoV-2/FLU/RSV plus assay is intended as an aid in the diagnosis of influenza from Nasopharyngeal swab specimens and should not be used as a sole basis for treatment. Nasal washings and aspirates are unacceptable for Xpert Xpress SARS-CoV-2/FLU/RSV testing.  Fact Sheet for Patients: EntrepreneurPulse.com.au  Fact Sheet for Healthcare Providers: IncredibleEmployment.be  This test is not yet approved or cleared by the [place] FDA and has been authorized for detection and/or diagnosis of SARS-CoV-2 by FDA under an Emergency Use Authorization (EUA). This EUA will remain in effect (meaning this test can be used) for  the duration of the COVID-19 declaration under Section 564(b)(1) of the Act, 21 U.S.C. section 360bbb-3(b)(1), unless the authorization is terminated or revoked.     Resp Syncytial Virus by PCR NEGATIVE NEGATIVE Final    Comment: (NOTE) Fact Sheet for Patients: EntrepreneurPulse.com.au  Fact Sheet for Healthcare Providers: IncredibleEmployment.be  This test is not yet approved or cleared by the [place] FDA and has been authorized for detection and/or diagnosis of SARS-CoV-2 by FDA under an Emergency Use Authorization (EUA). This EUA will remain in effect (meaning this test can be used) for the duration of the COVID-19 declaration under Section 564(b)(1) of the Act, 21 U.S.C. section 360bbb-3(b)(1), unless the authorization is terminated or revoked.  Performed at [facility], [place] [address], [place], [place] [address]   MRSA Next Gen by PCR, Nasal     Status: None   Collection Time: 01/25/23  9:36 PM   Specimen: Nasal Mucosa; Nasal Swab  Result Value Ref Range Status   MRSA by PCR Next Gen NOT DETECTED NOT DETECTED Final    Comment: (NOTE) The GeneXpert MRSA Assay (FDA approved for NASAL specimens only), is one component of a comprehensive MRSA colonization surveillance program. It is not intended to diagnose MRSA infection nor to guide or monitor treatment for MRSA infections. Test performance is not FDA approved in patients less than 61 years old. Performed at [facility], [place] [address] [place], [place] [address]          Radiology Studies: CT Angio Chest Pulmonary Embolism (PE) W or WO Contrast  Result Date: 01/25/2023 CLINICAL DATA:  Pulmonary embolism (PE) suspected, high prob EXAM: CT ANGIOGRAPHY CHEST WITH CONTRAST TECHNIQUE: Multidetector CT imaging of the chest was performed using the standard protocol during bolus administration of intravenous contrast. Multiplanar CT image reconstructions and MIPs were  obtained to evaluate the vascular anatomy. RADIATION DOSE REDUCTION: This exam was performed according  to the departmental dose-optimization program which includes automated exposure control, adjustment of the mA and/or kV according to patient size and/or use of iterative reconstruction technique. CONTRAST:  51m OMNIPAQUE IOHEXOL 350 MG/ML SOLN COMPARISON:  07/17/2021 FINDINGS: Cardiovascular: No filling defects in the pulmonary arteries to suggest pulmonary emboli. Heart is normal size. Aorta is normal caliber. Scattered coronary artery and aortic calcifications. Mediastinum/Nodes: No mediastinal, hilar, or axillary adenopathy. Trachea and esophagus are unremarkable. Thyroid unremarkable. Lungs/Pleura: Centrilobular emphysema. No confluent opacities or effusions. Upper Abdomen: No acute findings Musculoskeletal: Chest wall soft tissues are unremarkable. No acute bony abnormality. Review of the MIP images confirms the above findings. IMPRESSION: No evidence of pulmonary embolus. Coronary artery disease. Aortic Atherosclerosis (ICD10-I70.0) and Emphysema (ICD10-J43.9). Electronically Signed   By: KRolm BaptiseM.D.   On: 01/25/2023 20:33   DG Chest Portable 1 View  Result Date: 01/25/2023 CLINICAL DATA:  Shortness of breath and chest pain. EXAM: PORTABLE CHEST 1 VIEW COMPARISON:  January 16, 2023 FINDINGS: Cardiomediastinal silhouette is normal. Mediastinal contours appear intact. There is no evidence of focal airspace consolidation, pleural effusion or pneumothorax. Osseous structures are without acute abnormality. Soft tissues are grossly normal. IMPRESSION: No active disease. Electronically Signed   By: DFidela SalisburyM.D.   On: 01/25/2023 15:44        Scheduled Meds:  aspirin EC  81 mg Oral Daily   atorvastatin  40 mg Oral QHS   guaiFENesin  600 mg Oral BID   loratadine  10 mg Oral q AM   melatonin  12 mg Oral QHS   methylPREDNISolone (SOLU-MEDROL) injection  40 mg Intravenous Q12H   Followed  by   [Derrill MemoON 01/27/2023] predniSONE  40 mg Oral Q breakfast   pantoprazole  40 mg Oral BID   sertraline  50 mg Oral QHS   sodium chloride flush  3 mL Intravenous Q12H   Continuous Infusions:  sodium chloride Stopped (01/25/23 2229)   cefTRIAXone (ROCEPHIN)  IV Stopped (01/25/23 2210)   diltiazem (CARDIZEM) infusion 7.5 mg/hr (01/26/23 0624)     LOS: 1 day    Time spent: 35 minutes    Hazeline Charnley DDarleen Crocker DO [facility]  If 7PM-7AM, please contact night-coverage [email or web address] 01/26/2023, 7:45 AM

## 2023-01-26 NOTE — Evaluation (Signed)
Occupational Therapy Evaluation Patient Details Name: Paul [name] MRN: [id number] DOB: September 02, 1946 Today's Date: 01/26/2023   History of Present Illness Pt is 77 yo male who presents with SOB and cough. 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   Clinical Impression   Pt admitted with the above diagnoses and presents with below problem list. Pt will benefit from continued acute OT to address the below listed deficits and maximize independence with basic ADLs prior to d/c home. At baseline, pt is independent with ADLs, active. Pt presents with decreased activity tolerance impacting ADLs. DOE 2/4. Pt on 3L O2 throughout session with sats mostly >92 with the exception on initial walking into the hall. Pt able to recover SpO2 with pacing and breathing technique.       Recommendations for follow up therapy are one component of a multi-disciplinary discharge planning process, led by the attending physician.  Recommendations may be updated based on patient status, additional functional criteria and insurance authorization.   Follow Up Recommendations  No OT follow up     Assistance Recommended at Discharge PRN  Patient can return home with the following      Functional Status Assessment  Patient has had a recent decline in their functional status and demonstrates the ability to make significant improvements in function in a reasonable and predictable amount of time.  Equipment Recommendations  None recommended by OT    Recommendations for Other Services PT consult     Precautions / Restrictions Precautions Precaution Comments: 2L O2 at baseline Restrictions Weight Bearing Restrictions: No      Mobility Bed Mobility Overal bed mobility: Modified Independent                  Transfers Overall transfer level: Modified independent                         Balance Overall balance assessment: No apparent balance deficits (not formally assessed)                                         ADL either performed or assessed with clinical judgement   ADL Overall ADL's : Needs assistance/impaired Eating/Feeding: Set up;Sitting   Grooming: Supervision/safety;Set up;Sitting;Standing   Upper Body Bathing: Set up;Sitting   Lower Body Bathing: Supervison/ safety;Sit to/from stand   Upper Body Dressing : Set up;Sitting   Lower Body Dressing: Supervision/safety;Sit to/from stand   Toilet Transfer: Supervision/safety;Ambulation   Toileting- Clothing Manipulation and Hygiene: Supervision/safety;Sit to/from stand   Tub/ Shower Transfer: Supervision/safety   Functional mobility during ADLs: Supervision/safety;Modified independent General ADL Comments: Pt walked in the halls on 3L. DOE 2/4.     Vision         Perception     Praxis      Pertinent Vitals/Pain Pain Assessment Pain Assessment: No/denies pain     Hand Dominance Right   Extremity/Trunk Assessment Upper Extremity Assessment Upper Extremity Assessment: Overall WFL for tasks assessed   Lower Extremity Assessment Lower Extremity Assessment: Defer to PT evaluation       Communication Communication Communication: No difficulties   Cognition Arousal/Alertness: Awake/alert Behavior During Therapy: WFL for tasks assessed/performed Overall Cognitive Status: Within Functional Limits for tasks assessed  General Comments  Pt on 3L O2 throughout session. Initial SpO2 drop into upper 80s walking in the hall. Recovered and maintained SpO2 >92 with pacing and breathing technique. Daughter present during session.    Exercises     Shoulder Instructions      Home Living Family/patient expects to be discharged to:: Private residence Living Arrangements: Spouse/significant other Available Help at Discharge:  Family;Available 24 hours/day Type of Home: House Home Access: Level entry     Home Layout: One level     Bathroom Shower/Tub: Occupational psychologist: Standard Bathroom Accessibility: Yes   Home Equipment: Rollator (4 wheels);Shower seat;Grab bars - tub/shower          Prior Functioning/Environment Prior Level of Function : Independent/Modified Independent             Mobility Comments: Pt reports going to the gym and using the treadmill          OT Problem List: Decreased activity tolerance;Impaired balance (sitting and/or standing);Decreased knowledge of use of DME or AE;Decreased knowledge of precautions;Cardiopulmonary status limiting activity      OT Treatment/Interventions: Self-care/ADL training;Therapeutic exercise;Energy conservation;DME and/or AE instruction;Therapeutic activities;Patient/family education;Balance training    OT Goals(Current goals can be found in the care plan section) Acute Rehab OT Goals Patient Stated Goal: improve activity tolerance OT Goal Formulation: With patient Time For Goal Achievement: 02/09/23 Potential to Achieve Goals: Good ADL Goals Pt Will Perform Upper Body Bathing: Independently Pt Will Perform Lower Body Bathing: Independently Pt Will Perform Tub/Shower Transfer: Independently Additional ADL Goal #1: Pt will independently verbalize 2 energy conservation strategies for managing ADLs at home.  OT Frequency: Min 2X/week    Co-evaluation              AM-PAC OT "6 Clicks" Daily Activity     Outcome Measure Help from another person eating meals?: None Help from another person taking care of personal grooming?: None Help from another person toileting, which includes using toliet, bedpan, or urinal?: None Help from another person bathing (including washing, rinsing, drying)?: A Little Help from another person to put on and taking off regular upper body clothing?: None Help from another person to put on and  taking off regular lower body clothing?: None 6 Click Score: 23   End of Session Equipment Utilized During Treatment: Oxygen (3L)  Activity Tolerance: Other (comment);Patient tolerated treatment well (DOE 2/4) Patient left: in chair;with call bell/phone within reach;with family/visitor present  OT Visit Diagnosis: Unsteadiness on feet (R26.81)                Time: 0258-5277 OT Time Calculation (min): 24 min Charges:  OT General Charges $OT Visit: 1 Visit OT Evaluation $OT Eval Low Complexity: 1 Low OT Treatments $Self Care/Home Management : 8-22 mins  Tyrone Schimke, OT Acute Rehabilitation Services Office: [phone]   Hortencia Pilar 01/26/2023, 10:47 AM

## 2023-01-26 NOTE — Assessment & Plan Note (Signed)
Improved.  Continue to wean prednisone.  Antibiotic completed.

## 2023-01-26 NOTE — Progress Notes (Signed)
  Echocardiogram 2D Echocardiogram attempted at 1000. Pt is working with physical therapy. Will attempt again later.  Eartha Inch 01/26/2023, 10:02 AM

## 2023-01-27 DIAGNOSIS — I4891 Unspecified atrial fibrillation: Secondary | ICD-10-CM | POA: Diagnosis not present

## 2023-01-27 LAB — BASIC METABOLIC PANEL
Anion gap: 9 (ref 5–15)
BUN: 24 mg/dL — ABNORMAL HIGH (ref 8–23)
CO2: 28 mmol/L (ref 22–32)
Calcium: 8.4 mg/dL — ABNORMAL LOW (ref 8.9–10.3)
Chloride: 99 mmol/L (ref 98–111)
Creatinine, Ser: 0.87 mg/dL (ref 0.61–1.24)
GFR, Estimated: >60 mL/min (ref >60)
Glucose, Bld: 113 mg/dL — ABNORMAL HIGH (ref 70–99)
Potassium: 4.4 mmol/L (ref 3.5–5.1)
Sodium: 136 mmol/L (ref 135–145)

## 2023-01-27 LAB — CBC
HCT: 36.3 % — ABNORMAL LOW (ref 39.0–52.0)
Hemoglobin: 11.1 g/dL — ABNORMAL LOW (ref 13.0–17.0)
MCH: 25.8 pg — ABNORMAL LOW (ref 26.0–34.0)
MCHC: 30.6 g/dL (ref 30.0–36.0)
MCV: 84.2 fL (ref 80.0–100.0)
Platelets: 244 10*3/uL (ref 150–400)
RBC: 4.31 MIL/uL (ref 4.22–5.81)
RDW: 15.1 % (ref 11.5–15.5)
WBC: 14 10*3/uL — ABNORMAL HIGH (ref 4.0–10.5)
nRBC: 0 % (ref 0.0–0.2)

## 2023-01-27 LAB — PROCALCITONIN: Procalcitonin: <0.1 ng/mL

## 2023-01-27 LAB — GLUCOSE, CAPILLARY
Glucose-Capillary: 120 mg/dL — ABNORMAL HIGH (ref 70–99)
Glucose-Capillary: 155 mg/dL — ABNORMAL HIGH (ref 70–99)
Glucose-Capillary: 139 mg/dL — ABNORMAL HIGH (ref 70–99)
Glucose-Capillary: 183 mg/dL — ABNORMAL HIGH (ref 70–99)

## 2023-01-27 LAB — MAGNESIUM: Magnesium: 2.1 mg/dL (ref 1.7–2.4)

## 2023-01-27 MED ORDER — ADULT MULTIVITAMIN W/MINERALS CH
1.0000 | ORAL_TABLET | Freq: Every day | ORAL | Status: DC
  Administered 2023-01-28: 1 via ORAL
  Filled 2023-01-27: qty 1

## 2023-01-27 MED ORDER — BISOPROLOL FUMARATE 5 MG PO TABS
2.5000 mg | ORAL_TABLET | Freq: Every day | ORAL | Status: DC
  Administered 2023-01-27 – 2023-01-28 (×2): 2.5 mg via ORAL
  Filled 2023-01-27 (×2): qty 1

## 2023-01-27 NOTE — Progress Notes (Signed)
Mobility Specialist Progress Note   01/27/23 1130  Mobility  Activity Ambulated with assistance in hallway  Level of Assistance Modified independent, requires aide device or extra time  Assistive Device Front wheel walker  Distance Ambulated (ft) 300 ft  Range of Motion/Exercises Active;All extremities  Activity Response Tolerated well   Pre Ambulation: SpO2 96% RA During Ambulation: SpO2 81% RA; 91% 2LO2 Post Ambulation: SpO2 92% 2LO2  Patient received in recliner and agreeable to participate. Ambulated in hallway with steady gait. Oxygen saturation did decrease to 81% on RA, but was asymptomatic. Required 2LO2 supplemental O2, pursed lip breathing and standing rest break x1 to maintain >91%.  Returned to room without complaint or incident. Was left recliner with all needs met, call bell in reach.    [place] Dajha Urquilla, BS EXP Mobility Specialist Please contact via SecureChat or Rehab office at [phone]'

## 2023-01-27 NOTE — Plan of Care (Signed)
  Problem: Education: Goal: Knowledge of disease or condition will improve Outcome: Progressing Goal: Knowledge of the prescribed therapeutic regimen will improve Outcome: Progressing   Problem: Activity: Goal: Ability to tolerate increased activity will improve Outcome: Progressing Goal: Will verbalize the importance of balancing activity with adequate rest periods Outcome: Progressing   Problem: Respiratory: Goal: Ability to maintain a clear airway will improve Outcome: Progressing Goal: Levels of oxygenation will improve Outcome: Progressing Goal: Ability to maintain adequate ventilation will improve Outcome: Progressing   Problem: Education: Goal: Knowledge of General Education information will improve Description: Including pain rating scale, medication(s)/side effects and non-pharmacologic comfort measures Outcome: Progressing   Problem: Health Behavior/Discharge Planning: Goal: Ability to manage health-related needs will improve Outcome: Progressing   Problem: Clinical Measurements: Goal: Ability to maintain clinical measurements within normal limits will improve Outcome: Progressing Goal: Will remain free from infection Outcome: Progressing Goal: Diagnostic test results will improve Outcome: Progressing Goal: Respiratory complications will improve Outcome: Progressing Goal: Cardiovascular complication will be avoided Outcome: Progressing   Problem: Activity: Goal: Risk for activity intolerance will decrease Outcome: Progressing   Problem: Nutrition: Goal: Adequate nutrition will be maintained Outcome: Progressing   Problem: Coping: Goal: Level of anxiety will decrease Outcome: Progressing   Problem: Elimination: Goal: Will not experience complications related to bowel motility Outcome: Progressing Goal: Will not experience complications related to urinary retention Outcome: Progressing   Problem: Pain Managment: Goal: General experience of comfort  will improve Outcome: Progressing   Problem: Safety: Goal: Ability to remain free from injury will improve Outcome: Progressing   Problem: Skin Integrity: Goal: Risk for impaired skin integrity will decrease Outcome: Progressing

## 2023-01-27 NOTE — TOC Initial Note (Addendum)
Transition of Care [facility]) - Initial/Assessment Note    Patient Details  Name: Paul [name] MRN: [id number] Date of Birth: March 07, 1946  Transition of Care [facility]) CM/SW Contact:    Bethena Roys, RN Phone Number: 01/27/2023, 3:29 PM  Clinical Narrative:  Risk for readmission assessment completed. PTA patient was from home with support of spouse. Patient has oxygen 2 liters via [employer]- patient states he wears oxygen continuously. Case Manager is confirming the above information with the agency to make sure the initial Rx states continuous and not nocturnal. Patient is currently active with [facility] for outpatient palliative services. No home needs identified at this time.                [id number] 01-27-23 Case Manager confirmed that patient has continuous oxygen in the home with [employer] for portability as well. No further needs identified.  Expected Discharge Plan: Home/Self Care Barriers to Discharge: No Barriers Identified   Patient Goals and CMS Choice Patient states their goals for this hospitalization and ongoing recovery are:: to return home.   Choice offered to / list presented to : NA      Expected Discharge Plan and Services In-house Referral: NA Discharge Planning Services: CM Consult Post Acute Care Choice: NA Living arrangements for the past 2 months: Single Family Home     HH Arranged: NA    Prior Living Arrangements/Services Living arrangements for the past 2 months: Single Family Home Lives with:: Spouse Patient language and need for interpreter reviewed:: Yes Do you feel safe going back to the place where you live?: Yes      Need for Family Participation in Patient Care: Yes (Comment) Care giver support system in place?: Yes (comment) Current home services: DME (Oxygen via [employer] 2 Liters.) Criminal Activity/Legal Involvement Pertinent to Current Situation/Hospitalization: No - Comment as needed  Activities of Daily Living Home Assistive  Devices/Equipment: Walker (specify type), Dentures (specify type), Eyeglasses, Oxygen ADL Screening (condition at time of admission) Patient's cognitive ability adequate to safely complete daily activities?: Yes Is the patient deaf or have difficulty hearing?: No Does the patient have difficulty seeing, even when wearing glasses/contacts?: No Does the patient have difficulty concentrating, remembering, or making decisions?: No Patient able to express need for assistance with ADLs?: Yes Does the patient have difficulty dressing or bathing?: No Independently performs ADLs?: Yes (appropriate for developmental age) Does the patient have difficulty walking or climbing stairs?: No Weakness of Legs: None Weakness of Arms/Hands: None  Permission Sought/Granted Permission sought to share information with : Family Supports, Customer service manager, Case Optician, dispensing granted to share information with : Yes, Verbal Permission Granted     Permission granted to share info w AGENCY: [employer]        Emotional Assessment Appearance:: Appears stated age Attitude/Demeanor/Rapport: Engaged Affect (typically observed): Appropriate Orientation: : Oriented to Situation, Oriented to  Time, Oriented to Place, Oriented to Self Alcohol / Substance Use: Not Applicable Psych Involvement: No (comment)  Admission diagnosis:  Shortness of breath [R06.02] Chest tightness [R07.89] COPD exacerbation (HCC) [J44.1] Atrial fibrillation with rapid ventricular response ([place]) [I48.91] Atrial fibrillation with RVR ([place]) [I48.91] Patient Active Problem List   Diagnosis Date Noted   Atrial fibrillation with RVR ([place]) 01/25/2023   Elevated d-dimer 01/25/2023   Acute respiratory failure ([place]) 01/17/2023   COPD with acute exacerbation ([place]) 01/16/2023   Acute bronchitis with COPD ([place]) 01/06/2023   Iron 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 08/16/2022  Olecranon bursitis of right elbow  05/16/2022   Syncope 04/08/2022   GERD (gastroesophageal reflux disease) / GI protection on antiplatelet therapy 12/27/2021   Lumbar back pain 11/17/2021   Oxygen dependent 10/09/2021   Paroxysmal atrial fibrillation (HCC) 08/02/2021   Chronic respiratory failure with hypoxia (HCC) 01/16/2021   GAD (generalized anxiety disorder) 01/14/2021   Chronic diastolic CHF (congestive heart failure) ([place]) 04/01/2020   Age-related osteoporosis with healing pathological fracture, chronic back pain 02/06/2020   Hypertension associated with diabetes ([place]) 01/28/2020   Chronic bilateral thoracic back pain 01/25/2020   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 [id number]: [phone]: [id number]: [phone]: [phone]  Social Determinants of Health (SDOH) Social History: SDOH Screenings   Food Insecurity: No Food Insecurity (01/25/2023)  Housing: Low Risk  (01/25/2023)  Transportation Needs: No Transportation Needs (01/25/2023)  Utilities: Not At Risk (01/25/2023)  Alcohol Screen: Low Risk  (02/15/2022)  Depression (PHQ2-9): Low Risk  (11/22/2022)  Financial Resource Strain: Medium Risk (10/31/2022)  Physical Activity: Insufficiently Active (02/15/2022)  Social Connections: Socially Integrated (02/15/2022)  Stress: No Stress Concern Present (02/15/2022)  Tobacco Use: Medium Risk (01/25/2023)   Readmission Risk  Interventions    01/27/2023    3:27 PM 07/17/2022    3:42 PM 03/03/2022    1:33 PM  Readmission Risk Prevention Plan  Transportation Screening Complete Complete Complete  PCP or Specialist Appt within 3-5 Days   Complete  HRI or Home Care Consult Complete Complete Complete  Social Work Consult for [place]/Counseling Complete Complete Complete  Palliative Care Screening Not Applicable Not Applicable Complete  Medication Review (Press photographer Referral to Pharmacy  Complete

## 2023-01-27 NOTE — Progress Notes (Addendum)
PROGRESS NOTE    Paul [name]  LFY:[id number] DOB: [birth year] DOA: 01/25/2023 PCP: [name], MD   Brief Narrative:    Paul [name] is a 77 y.o. 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  He states that he is feeling better this morning and has stable heart rate.  Assessment & Plan:   Principal Problem:   Atrial fibrillation with RVR (HCC) Active Problems:   HLD (hyperlipidemia)   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HCC)   Chronic diastolic CHF (congestive heart failure) (HCC)   Chronic respiratory failure with hypoxia (HCC)   COPD with acute exacerbation (HCC)   Elevated d-dimer  Assessment and Plan:    Atrial fibrillation with RVR (HCC)-resolved and in SR Currently in sinus rhythm, diltiazem drip discontinued Was previously on bisoprolol 5 mg daily, noted to have some dizziness with this, but stable heart rates and blood pressures.  Adjusted to 2.5 mg daily on 2/5 to see how well this will be tolerated.  Appreciate cardiology consult with no other recommendations at this time. Echocardiogram with LVEF 65 to 70% with grade 1 diastolic dysfunction on 2/4 Pt is sp watchman, no need for anticoagulation TSH within normal limits PE study negative   COPD with acute exacerbation (HCC)-improving  -  - Will initiate: Steroid taper  -  Antibiotics rocephin -  XopenexPRN, - scheduled duoneb,  -  Breo or Dulera at discharge   -  Mucinex.  Titrate O2 to saturation >90%. Follow patients respiratory  status.  VBG  -    -   BiPAP ordered PRN for increased work of breathing.  Currently mentating well no evidence of symptomatic hypercarbia    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[place]) Order sliding scale and diabetes coordinator consult   HLD (hyperlipidemia) Continue lipitor 40 mg po qday  Chronic respiratory failure with hypoxia (HCC) Continue oxygen 2L  Likely in the setting of copd   Chronic diastolic CHF (congestive heart failure) (HCC) Avoid fluid over load   Elevated d-dimer CTA neg for PE    DVT prophylaxis:SCDs Code Status: Full Family Communication: Daughter at bedside 2/4 Disposition Plan:  Status is: Inpatient Remains inpatient appropriate because: Need for IV medications  Consultants:  Cardiology  Procedures:  None  Antimicrobials:  Anti-infectives (From admission, onward)    Start     Dose/Rate Route Frequency Ordered Stop   01/25/23 2000  cefTRIAXone (ROCEPHIN) 1 g in sodium chloride 0.9 % 100 mL IVPB        1 g 200 mL/hr over 30 Minutes Intravenous Every 24 hours 01/25/23 1948 01/30/23 1959      Subjective: Patient seen and evaluated today with no new acute complaints or concerns. No acute concerns or events noted overnight.  He states he is starting to feel better with minimal shortness of breath or wheezing.  He states that he feels that he is close to discharge, but not quite at baseline and is still having some minimal wheezing.  Objective: Vitals:   01/26/23 1121 01/26/23 1628  01/26/23 2049 01/27/23 0412  BP:  134/67 135/68 (!) 139/59  Pulse:  89 91 87  Resp:  '18 17 18  '$ Temp:  98.3 F (36.8 C) 98.4 F (36.9 C) 97.8 F (36.6 C)  TempSrc:  Oral Oral Oral  SpO2: 99% 95% 94% 98%  Weight:      Height:        Intake/Output Summary (Last 24 hours) at 01/27/2023 0828 Last data filed at 01/27/2023 0414 Gross per 24 hour  Intake 294.31 ml  Output 1150 ml  Net -855.69 ml   Filed Weights   01/25/23 2104  Weight: 73.3 kg     Examination:  General exam: Appears calm and comfortable  Respiratory system: Minimal wheezing bilaterally. Respiratory effort normal.  Currently on 1 L nasal cannula Cardiovascular system: S1 & S2 heard, RRR.  Gastrointestinal system: Abdomen is soft Central nervous system: Alert and awake Extremities: No edema Skin: No significant lesions noted Psychiatry: Flat affect.    Data Reviewed: I have personally reviewed following labs and imaging studies  CBC: Recent Labs  Lab 01/22/23 1404 01/25/23 1509 01/25/23 2000 01/25/23 2226 01/26/23 0154 01/27/23 0138  WBC 14.4* 15.1*  --  11.4* 11.8* 14.0*  NEUTROABS 13.6*  --   --  10.3*  --   --   HGB 13.4 13.6 12.9* 12.1* 11.6* 11.1*  HCT 42.5 44.1 38.0* 37.4* 37.7* 36.3*  MCV 82 84.8  --  81.8 84.2 84.2  PLT 268 279  --  228 224 532   Basic Metabolic Panel: Recent Labs  Lab 01/22/23 1404 01/25/23 1509 01/25/23 1953 01/25/23 2000 01/26/23 0154 01/27/23 0138  NA 138 138  --  137 136 136  K 5.7* 4.2  --  4.5 4.4 4.4  CL 93* 95*  --   --  95* 99  CO2 29 31  --   --  29 28  GLUCOSE 153* 136*  --   --  241* 113*  BUN 14 12  --   --  18 24*  CREATININE 0.72* 0.95  --   --  0.91 0.87  CALCIUM 9.6 9.2  --   --  8.6* 8.4*  MG  --   --  2.1  --  2.0 2.1  PHOS  --   --  3.8  --  4.3  --    GFR: Estimated Creatinine Clearance: 64.2 mL/min (by C-G formula based on SCr of 0.87 mg/dL). Liver Function Tests: Recent Labs  Lab 01/22/23 1404 01/25/23 1953 01/26/23 0154  AST 31 30 32  ALT 56* 48* 43  ALKPHOS 83 59 58  BILITOT 0.3 0.6 0.3  PROT 6.6 6.1* 5.4*  ALBUMIN 4.4 3.2* 3.0*   No results for input(s): "LIPASE", "AMYLASE" in the last 168 hours. No results for input(s): "AMMONIA" in the last 168 hours. Coagulation Profile: Recent Labs  Lab 01/25/23 1953  INR 0.9   Cardiac Enzymes: Recent Labs  Lab 01/25/23 1953  CKTOTAL 56   BNP (last 3 results) No results for input(s): "PROBNP" in the last 8760  hours. HbA1C: No results for input(s): "HGBA1C" in the last 72 hours. CBG: Recent Labs  Lab 01/26/23 0643 01/26/23 0754 01/26/23 1118 01/26/23 1626 01/26/23 2054  GLUCAP 145* 114* 125* 156* 187*   Lipid Profile: No results for input(s): "CHOL", "HDL", "LDLCALC", "TRIG", "CHOLHDL", "LDLDIRECT" in the last 72 hours. Thyroid Function Tests: Recent Labs    01/25/23 1953  TSH 0.792   Anemia Panel: No results for input(s): "VITAMINB12", "  FOLATE", "FERRITIN", "TIBC", "IRON", "RETICCTPCT" in the last 72 hours. Sepsis Labs: Recent Labs  Lab 01/25/23 1953 01/25/23 2226 01/26/23 0154 01/27/23 0138  PROCALCITON <0.10  --  <0.10 <0.10  LATICACIDVEN 1.5 1.6  --   --     Recent Results (from the past 240 hour(s))  Resp panel by RT-PCR (RSV, Flu A&B, Covid) Anterior Nasal Swab     Status: None   Collection Time: 01/25/23  6:00 PM   Specimen: Anterior Nasal Swab  Result Value Ref Range Status   SARS Coronavirus 2 by RT PCR NEGATIVE NEGATIVE Final   Influenza A by PCR NEGATIVE NEGATIVE Final   Influenza B by PCR NEGATIVE NEGATIVE Final    Comment: (NOTE) The Xpert Xpress SARS-CoV-2/FLU/RSV plus assay is intended as an aid in the diagnosis of influenza from Nasopharyngeal swab specimens and should not be used as a sole basis for treatment. Nasal washings and aspirates are unacceptable for Xpert Xpress SARS-CoV-2/FLU/RSV testing.  Fact Sheet for Patients: EntrepreneurPulse.com.au  Fact Sheet for Healthcare Providers: IncredibleEmployment.be  This test is not yet approved or cleared by the [place] FDA and has been authorized for detection and/or diagnosis of SARS-CoV-2 by FDA under an Emergency Use Authorization (EUA). This EUA will remain in effect (meaning this test can be used) for the duration of the COVID-19 declaration under Section 564(b)(1) of the Act, 21 U.S.C. section 360bbb-3(b)(1), unless the authorization is terminated  or revoked.     Resp Syncytial Virus by PCR NEGATIVE NEGATIVE Final    Comment: (NOTE) Fact Sheet for Patients: EntrepreneurPulse.com.au  Fact Sheet for Healthcare Providers: IncredibleEmployment.be  This test is not yet approved or cleared by the [place] FDA and has been authorized for detection and/or diagnosis of SARS-CoV-2 by FDA under an Emergency Use Authorization (EUA). This EUA will remain in effect (meaning this test can be used) for the duration of the COVID-19 declaration under Section 564(b)(1) of the Act, 21 U.S.C. section 360bbb-3(b)(1), unless the authorization is terminated or revoked.  Performed at [facility], [place] [address], [place], [place] [address]   MRSA Next Gen by PCR, Nasal     Status: None   Collection Time: 01/25/23  9:36 PM   Specimen: Nasal Mucosa; Nasal Swab  Result Value Ref Range Status   MRSA by PCR Next Gen NOT DETECTED NOT DETECTED Final    Comment: (NOTE) The GeneXpert MRSA Assay (FDA approved for NASAL specimens only), is one component of a comprehensive MRSA colonization surveillance program. It is not intended to diagnose MRSA infection nor to guide or monitor treatment for MRSA infections. Test performance is not FDA approved in patients less than 86 years old. Performed at [facility], [place] [address], [place], [place] [address]          Radiology Studies: ECHOCARDIOGRAM COMPLETE  Result Date: 01/26/2023    ECHOCARDIOGRAM REPORT   Patient Name:   TYRELL SEIFER Date of Exam: 01/26/2023 Medical Rec #:  [id number]      Height:       66.0 in Accession #:    [id number]     Weight:       161.6 lb Date of Birth:  10-13-1946       BSA:          1.827 m Patient Age:    6 years       BP:           120/50 mmHg Patient Gender: M  HR:           84 bpm. Exam Location:  Inpatient Procedure: 2D Echo, Cardiac Doppler, Color Doppler and Intracardiac            Opacification Agent  Indications:    Elevated troponin  History:        Patient has prior history of Echocardiogram examinations, most                 recent 09/13/2021. COPD, Arrythmias:Atrial Fibrillation; Risk                 Factors:Diabetes and Hypertension. 56m Watchman Device                 implanted 08/02/21.  Sonographer:    AClayton LefortRDCS (AE) Referring Phys: AToy Baker Sonographer Comments: Technically difficult study due to poor echo windows. IMPRESSIONS  1. Left ventricular ejection fraction, by estimation, is 65 to 70%. The left ventricle has normal function. The left ventricle has no regional wall motion abnormalities. Left ventricular diastolic parameters are consistent with Grade I diastolic dysfunction (impaired relaxation).  2. Right ventricular systolic function is low normal. The right ventricular size is normal. The estimated right ventricular systolic pressure is 179.8mmHg.  3. The mitral valve is grossly normal. Trivial mitral valve regurgitation.  4. The aortic valve is tricuspid. Aortic valve regurgitation is trivial. Aortic valve sclerosis is present, with no evidence of aortic valve stenosis.  5. The inferior vena cava is normal in size with greater than 50% respiratory variability, suggesting right atrial pressure of 3 mmHg. Comparison(s): Changes from prior study are noted. 09/13/2021: LVEF 60-65%. FINDINGS  Left Ventricle: Left ventricular ejection fraction, by estimation, is 65 to 70%. The left ventricle has normal function. The left ventricle has no regional wall motion abnormalities. Definity contrast agent was given IV to delineate the left ventricular  endocardial borders. The left ventricular internal cavity size was normal in size. There is no left ventricular hypertrophy. Left ventricular diastolic parameters are consistent with Grade I diastolic dysfunction (impaired relaxation). Indeterminate filling pressures. Right Ventricle: The right ventricular size is normal. No increase in right  ventricular wall thickness. Right ventricular systolic function is low normal. The tricuspid regurgitant velocity is 2.04 m/s, and with an assumed right atrial pressure of 3 mmHg, the estimated right ventricular systolic pressure is 192.1mmHg. Left Atrium: Left atrial size was normal in size. Right Atrium: Right atrial size was normal in size. Pericardium: There is no evidence of pericardial effusion. Mitral Valve: The mitral valve is grossly normal. Trivial mitral valve regurgitation. Tricuspid Valve: The tricuspid valve is grossly normal. Tricuspid valve regurgitation is trivial. Aortic Valve: The aortic valve is tricuspid. Aortic valve regurgitation is trivial. Aortic valve sclerosis is present, with no evidence of aortic valve stenosis. Aortic valve mean gradient measures 1.0 mmHg. Aortic valve peak gradient measures 2.6 mmHg. Aortic valve area, by VTI measures 2.83 cm. Pulmonic Valve: The pulmonic valve was normal in structure. Pulmonic valve regurgitation is not visualized. Aorta: The aortic root and ascending aorta are structurally normal, with no evidence of dilitation. Venous: The inferior vena cava is normal in size with greater than 50% respiratory variability, suggesting right atrial pressure of 3 mmHg. IAS/Shunts: No atrial level shunt detected by color flow Doppler.  LEFT VENTRICLE PLAX 2D LVIDd:         4.20 cm   Diastology LVIDs:         2.60 cm   LV e'  medial:    5.87 cm/s LV PW:         1.10 cm   LV E/e' medial:  10.4 LV IVS:        1.00 cm   LV e' lateral:   8.49 cm/s LVOT diam:     2.20 cm   LV E/e' lateral: 7.2 LV SV:         45 LV SV Index:   25 LVOT Area:     3.80 cm  RIGHT VENTRICLE             IVC RV Basal diam:  2.60 cm     IVC diam: 1.60 cm RV S prime:     10.20 cm/s TAPSE (M-mode): 2.0 cm LEFT ATRIUM           Index        RIGHT ATRIUM           Index LA diam:      3.00 cm 1.64 cm/m   RA Area:     11.40 cm LA Vol (A2C): 29.7 ml 16.26 ml/m  RA Volume:   24.40 ml  13.36 ml/m LA Vol  (A4C): 36.7 ml 20.09 ml/m  AORTIC VALVE AV Area (Vmax):    3.37 cm AV Area (Vmean):   3.08 cm AV Area (VTI):     2.83 cm AV Vmax:           80.40 cm/s AV Vmean:          52.500 cm/s AV VTI:            0.160 m AV Peak Grad:      2.6 mmHg AV Mean Grad:      1.0 mmHg LVOT Vmax:         71.30 cm/s LVOT Vmean:        42.600 cm/s LVOT VTI:          0.119 m LVOT/AV VTI ratio: 0.74  AORTA Ao Root diam: 3.20 cm Ao Asc diam:  2.80 cm MITRAL VALVE               TRICUSPID VALVE MV Area (PHT): 3.66 cm    TR Peak grad:   16.6 mmHg MV Decel Time: 207 msec    TR Vmax:        204.00 cm/s MV E velocity: 61.30 cm/s MV A velocity: 63.20 cm/s  SHUNTS MV E/A ratio:  0.97        Systemic VTI:  0.12 m                            Systemic Diam: 2.20 cm Lyman Bishop MD Electronically signed by Lyman Bishop MD Signature Date/Time: 01/26/2023/4:02:20 PM    Final    VAS [place] LOWER EXTREMITY VENOUS (DVT)  Result Date: 01/26/2023  Lower Venous DVT Study Patient Name:  CULLIN DISHMAN  Date of Exam:   01/26/2023 Medical Rec #: [id number]       Accession #:    [id number] Date of Birth: 02-Oct-1946        Patient Gender: M Patient Age:   89 years Exam Location[facility]  [facility] Procedure:      VAS [place] LOWER EXTREMITY VENOUS (DVT) Referring Phys: Nyoka Lint DOUTOVA --------------------------------------------------------------------------------  Indications: Edema.  Comparison Study: No prior studies. Performing Technologist: Darlin Coco RDMS, RVT  Examination Guidelines: A complete evaluation includes B-mode imaging, spectral Doppler, color Doppler, and power Doppler as needed  of all accessible portions of each vessel. Bilateral testing is considered an integral part of a complete examination. Limited examinations for reoccurring indications may be performed as noted. The reflux portion of the exam is performed with the patient in reverse Trendelenburg.  +---------+---------------+---------+-----------+----------+--------------+ RIGHT     CompressibilityPhasicitySpontaneityPropertiesThrombus Aging +---------+---------------+---------+-----------+----------+--------------+ CFV      Full           Yes      Yes                                 +---------+---------------+---------+-----------+----------+--------------+ SFJ      Full                                                        +---------+---------------+---------+-----------+----------+--------------+ FV Prox  Full                                                        +---------+---------------+---------+-----------+----------+--------------+ FV Mid   Full                                                        +---------+---------------+---------+-----------+----------+--------------+ FV DistalFull                                                        +---------+---------------+---------+-----------+----------+--------------+ PFV      Full                                                        +---------+---------------+---------+-----------+----------+--------------+ POP      Full           Yes      Yes                                 +---------+---------------+---------+-----------+----------+--------------+ PTV      Full                                                        +---------+---------------+---------+-----------+----------+--------------+ PERO     Full                                                        +---------+---------------+---------+-----------+----------+--------------+   +---------+---------------+---------+-----------+----------+--------------+ LEFT  CompressibilityPhasicitySpontaneityPropertiesThrombus Aging +---------+---------------+---------+-----------+----------+--------------+ CFV      Full           Yes      Yes                                 +---------+---------------+---------+-----------+----------+--------------+ SFJ      Full                                                         +---------+---------------+---------+-----------+----------+--------------+ FV Prox  Full                                                        +---------+---------------+---------+-----------+----------+--------------+ FV Mid   Full                                                        +---------+---------------+---------+-----------+----------+--------------+ FV DistalFull                                                        +---------+---------------+---------+-----------+----------+--------------+ PFV      Full                                                        +---------+---------------+---------+-----------+----------+--------------+ POP      Full           Yes      Yes                                 +---------+---------------+---------+-----------+----------+--------------+ PTV      Full                                                        +---------+---------------+---------+-----------+----------+--------------+ PERO     Full                                                        +---------+---------------+---------+-----------+----------+--------------+     Summary: RIGHT: - There is no evidence of deep vein thrombosis in the lower extremity.  - No cystic structure found in the popliteal fossa.  LEFT: - There is no evidence of deep vein thrombosis in the lower extremity.  - No cystic structure found in the  popliteal fossa.  *See table(s) above for measurements and observations.    Preliminary    CT Angio Chest Pulmonary Embolism (PE) W or WO Contrast  Result Date: 01/25/2023 CLINICAL DATA:  Pulmonary embolism (PE) suspected, high prob EXAM: CT ANGIOGRAPHY CHEST WITH CONTRAST TECHNIQUE: Multidetector CT imaging of the chest was performed using the standard protocol during bolus administration of intravenous contrast. Multiplanar CT image reconstructions and MIPs were obtained to evaluate the vascular anatomy. RADIATION DOSE  REDUCTION: This exam was performed according to the departmental dose-optimization program which includes automated exposure control, adjustment of the mA and/or kV according to patient size and/or use of iterative reconstruction technique. CONTRAST:  74m OMNIPAQUE IOHEXOL 350 MG/ML SOLN COMPARISON:  07/17/2021 FINDINGS: Cardiovascular: No filling defects in the pulmonary arteries to suggest pulmonary emboli. Heart is normal size. Aorta is normal caliber. Scattered coronary artery and aortic calcifications. Mediastinum/Nodes: No mediastinal, hilar, or axillary adenopathy. Trachea and esophagus are unremarkable. Thyroid unremarkable. Lungs/Pleura: Centrilobular emphysema. No confluent opacities or effusions. Upper Abdomen: No acute findings Musculoskeletal: Chest wall soft tissues are unremarkable. No acute bony abnormality. Review of the MIP images confirms the above findings. IMPRESSION: No evidence of pulmonary embolus. Coronary artery disease. Aortic Atherosclerosis (ICD10-I70.0) and Emphysema (ICD10-J43.9). Electronically Signed   By: KRolm BaptiseM.D.   On: 01/25/2023 20:33   DG Chest Portable 1 View  Result Date: 01/25/2023 CLINICAL DATA:  Shortness of breath and chest pain. EXAM: PORTABLE CHEST 1 VIEW COMPARISON:  January 16, 2023 FINDINGS: Cardiomediastinal silhouette is normal. Mediastinal contours appear intact. There is no evidence of focal airspace consolidation, pleural effusion or pneumothorax. Osseous structures are without acute abnormality. Soft tissues are grossly normal. IMPRESSION: No active disease. Electronically Signed   By: DFidela SalisburyM.D.   On: 01/25/2023 15:44        Scheduled Meds:  aspirin EC  81 mg Oral Daily   atorvastatin  40 mg Oral QHS   bisoprolol  5 mg Oral Daily   ferrous sulfate  325 mg Oral BID WC   fluticasone furoate-vilanterol  1 puff Inhalation Daily   And   umeclidinium bromide  1 puff Inhalation Daily   guaiFENesin  600 mg Oral BID   loratadine   10 mg Oral q AM   melatonin  12 mg Oral QHS   pantoprazole  40 mg Oral BID   predniSONE  40 mg Oral Q breakfast   sertraline  50 mg Oral QHS   sodium chloride flush  3 mL Intravenous Q12H   Continuous Infusions:  sodium chloride 10 mL/hr at 01/26/23 2108   cefTRIAXone (ROCEPHIN)  IV 1 g (01/26/23 2112)     LOS: 2 days    Time spent: 35 minutes    Sabastian Raimondi DDarleen Crocker DO [facility]  If 7PM-7AM, please contact night-coverage [email or web address] 01/27/2023, 8:28 AM

## 2023-01-27 NOTE — Progress Notes (Signed)
   This pt is currently active with [facility]. This is a home-based Palliative care program that is provided by [facility]. We will follow the pt at home after discharge to assist with any chronic/acute symptom management needs.He is eligible for other [facility] services in home with our program in place as well if needed.   Thank you Webb Silversmith RN [phone]

## 2023-01-27 NOTE — Progress Notes (Signed)
Occupational Therapy Treatment Patient Details Name: Paul [name] MRN: [id number] DOB: 05-Jul-1946 Today's Date: 01/27/2023   History of present illness Pt is 77 yo male who presented 01/25/23 with SOB and cough. Admitted with afib with RVR and acute COPD exacerbation. 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   OT comments  Pt progressing towards goals, completing toilet and simulated tub transfer with supervision, pt with SpO2 89% at lowest on RA with in-room mobility and 1 episode of dizziness requiring seated rest break. Pt provided with energy conservation handout, reviewed with pt and spouse, pt verbalized understanding. Pt presenting with impairments listed below, will follow acutely. Anticipate no OT needs at d/c.   Recommendations for follow up therapy are one component of a multi-disciplinary discharge planning process, led by the attending physician.  Recommendations may be updated based on patient status, additional functional criteria and insurance authorization.    Follow Up Recommendations  No OT follow up     Assistance Recommended at Discharge PRN  Patient can return home with the following  A little help with walking and/or transfers;A little help with bathing/dressing/bathroom;Assist for transportation;Help with stairs or ramp for entrance;Assistance with cooking/housework   Equipment Recommendations  None recommended by OT    Recommendations for Other Services      Precautions / Restrictions Precautions Precautions: Other (comment) Precaution Comments: watch HR & SpO2 (intermittently uses 2L O2 with heavy activity) Restrictions Weight Bearing Restrictions: No       Mobility Bed Mobility               General bed mobility comments: Pt sitting up in chair upon arrival.    Transfers Overall transfer level: Modified independent Equipment used:  None                     Balance Overall balance assessment: No apparent balance deficits (not formally assessed)                                         ADL either performed or assessed with clinical judgement   ADL Overall ADL's : Needs assistance/impaired                         Toilet Transfer: Supervision/safety;Ambulation   Toileting- Clothing Manipulation and Hygiene: Supervision/safety;Sit to/from stand       Functional mobility during ADLs: Supervision/safety;Modified independent      Extremity/Trunk Assessment Upper Extremity Assessment Upper Extremity Assessment: Overall WFL for tasks assessed   Lower Extremity Assessment Lower Extremity Assessment: Defer to PT evaluation        Vision   Vision Assessment?: No apparent visual deficits   Perception Perception Perception: Not tested   Praxis Praxis Praxis: Not tested    Cognition Arousal/Alertness: Awake/alert Behavior During Therapy: WFL for tasks assessed/performed Overall Cognitive Status: Within Functional Limits for tasks assessed                                          Exercises      Shoulder Instructions       General Comments SpO2 89% at lowest on RA    Pertinent Vitals/ Pain  Pain Assessment Pain Assessment: No/denies pain  Home Living                                          Prior Functioning/Environment              Frequency  Min 2X/week        Progress Toward Goals  OT Goals(current goals can now be found in the care plan section)  Progress towards OT goals: Progressing toward goals  Acute Rehab OT Goals Patient Stated Goal: none stated OT Goal Formulation: With patient Time For Goal Achievement: 02/09/23 Potential to Achieve Goals: Good ADL Goals Pt Will Perform Upper Body Bathing: Independently Pt Will Perform Lower Body Bathing: Independently Pt Will Perform Tub/Shower Transfer:  Independently Additional ADL Goal #1: Pt will independently verbalize 2 energy conservation strategies for managing ADLs at home.  Plan Discharge plan remains appropriate;Frequency remains appropriate    Co-evaluation                 AM-PAC OT "6 Clicks" Daily Activity     Outcome Measure   Help from another person eating meals?: None Help from another person taking care of personal grooming?: None Help from another person toileting, which includes using toliet, bedpan, or urinal?: None Help from another person bathing (including washing, rinsing, drying)?: A Little Help from another person to put on and taking off regular upper body clothing?: None Help from another person to put on and taking off regular lower body clothing?: None 6 Click Score: 23    End of Session    OT Visit Diagnosis: Unsteadiness on feet (R26.81)   Activity Tolerance Patient tolerated treatment well   Patient Left in chair;with call bell/phone within reach;with family/visitor present   Nurse Communication Mobility status        Time: 9574-7340 OT Time Calculation (min): 13 min  Charges: OT General Charges $OT Visit: 1 Visit OT Treatments $Self Care/Home Management : 8-22 mins  Renaye Rakers, OTD, OTR/L [facility]Chat [facility] [phone]   Jakyri Brunkhorst K Koonce 01/27/2023, 3:13 PM

## 2023-01-27 NOTE — Inpatient Diabetes Management (Signed)
Inpatient Diabetes Program Recommendations  AACE/ADA: New Consensus Statement on Inpatient Glycemic Control (2015)  Target Ranges:  Prepandial:   less than 140 mg/dL      Peak postprandial:   less than 180 mg/dL (1-2 hours)      Critically ill patients:  140 - 180 mg/dL   Lab Results  Component Value Date   GLUCAP 120 (H) 01/27/2023   HGBA1C 6.3 (H) 11/18/2022    Review of Glycemic Control  Latest Reference Range & Units 01/26/23 11:18 01/26/23 16:26 01/26/23 20:54 01/27/23 08:43  Glucose-Capillary 70 - 99 mg/dL 125 (H) 156 (H) 187 (H) 120 (H)  (H): Data is abnormally high Diabetes history: Type 2 DM Outpatient Diabetes medications: none Current orders for Inpatient glycemic control: none, CBGs TID & HS Prednisone 40 mg QD  Inpatient Diabetes Program Recommendations:    Noted consult. Patient within inpatient target goals for glucose trends and A1C is at goal per ADA guidelines. Will sign off.   Thanks, Bronson Curb, MSN, RNC-OB Diabetes Coordinator [phone] (8a-5p)

## 2023-01-27 NOTE — Progress Notes (Signed)
Initial Nutrition Assessment  DOCUMENTATION CODES:   Not applicable  INTERVENTION:  Pt reports appetite is good and reports he does not need an oral nutrition supplement at this time.  Provide multivitamin with minerals daily.  NUTRITION DIAGNOSIS:   Increased nutrient needs related to catabolic illness (COPD) as evidenced by estimated needs.  GOAL:   Patient will meet greater than or equal to 90% of their needs  MONITOR:   PO intake, Labs, Weight trends, I & O's  REASON FOR ASSESSMENT:   Consult  (Assess nutritional status)  ASSESSMENT:   77 year old male with PMHx of COPD on O2 2 L chronically, A-fib not on anticoagulant s/p watchman,DM2, CHF, HLD, anemia admitted with A-fib with RVR, acute exacerbation of COPD.  Met with pt and wife at bedside. He reports his appetite is good and unchanged from baseline. Pt reports eating 3 meals+ occasional snack. For breakfast he has eggs with toast or cereal. For lunch he has sandwich or leftovers. For dinner he has meat with vegetables. Snacks may be chips or cookie. He denies food allergies or intolerances. Denies nausea, emesis, abdominal pain, or difficulty chewing/swallowing. He reports taking multivitamin, vitamin D (unknown dose), vitamin C (unknown dose), and over-the-counter iron BID as supplements at home. He reports his PCP manages these supplements and he plans to resume them at discharge. He reports he is eating well here. Denies need for ONS at this time. Discussed increased calorie/protein needs with AECOPD.  Pt reports his UBW is 161 lbs and he denies any unintentional weight loss. Current wt is 73.3 kg (161.6 lbs).   Medications reviewed and include: ferrous sulfate 325 mg BID, melatonin, pantoprazole, prednisone, ceftriaxone  Labs reviewed: CBG 120-187, BUN 24  UOP: 1150 ml (0.7 mL/kg/hr)  I/O: -736.4 mL since admission  NUTRITION - FOCUSED PHYSICAL EXAM:  Flowsheet Row Most Recent Value  Orbital Region No  depletion  Upper Arm Region Mild depletion  Thoracic and Lumbar Region No depletion  Buccal Region No depletion  Temple Region No depletion  Clavicle Bone Region No depletion  Clavicle and Acromion Bone Region No depletion  Scapular Bone Region No depletion  Dorsal Hand No depletion  Patellar Region No depletion  Anterior Thigh Region No depletion  Posterior Calf Region Moderate depletion  Edema (RD Assessment) Mild  Hair Reviewed  Eyes Reviewed  Mouth Reviewed  Skin Reviewed  Nails Reviewed       Diet Order:   Diet Order             Diet Carb Modified Fluid consistency: Thin; Room service appropriate? Yes  Diet effective now                  EDUCATION NEEDS:   No education needs have been identified at this time  Skin:  Skin Assessment: Reviewed RN Assessment  Last BM:  01/24/23  Height:   Ht Readings from Last 1 Encounters:  01/25/23 '5\' 6"'$  (1.676 m)   Weight:   Wt Readings from Last 1 Encounters:  01/25/23 73.3 kg   Ideal Body Weight:  64.5 kg  BMI:  Body mass index is 26.08 kg/m.  Estimated Nutritional Needs:   Kcal:  2000-2200  Protein:  95-105 grams  Fluid:  2 L/day  Loanne Drilling, MS, RD, LDN, CNSC Pager number available on Amion

## 2023-01-27 NOTE — Progress Notes (Signed)
SATURATION QUALIFICATIONS: (This note is used to comply with regulatory documentation for home oxygen)  Patient Saturations on Room Air at Rest = 96%  Patient Saturations on Room Air while Ambulating = 81%  Patient Saturations on 2 Liters of oxygen while Ambulating = 91%  Please briefly explain why patient needs home oxygen:

## 2023-01-28 DIAGNOSIS — I4891 Unspecified atrial fibrillation: Secondary | ICD-10-CM | POA: Diagnosis not present

## 2023-01-28 LAB — CBC
HCT: 39.2 % (ref 39.0–52.0)
Hemoglobin: 12.2 g/dL — ABNORMAL LOW (ref 13.0–17.0)
MCH: 26.3 pg (ref 26.0–34.0)
MCHC: 31.1 g/dL (ref 30.0–36.0)
MCV: 84.5 fL (ref 80.0–100.0)
Platelets: 247 10*3/uL (ref 150–400)
RBC: 4.64 MIL/uL (ref 4.22–5.81)
RDW: 15.4 % (ref 11.5–15.5)
WBC: 11.2 10*3/uL — ABNORMAL HIGH (ref 4.0–10.5)
nRBC: 0 % (ref 0.0–0.2)

## 2023-01-28 LAB — GLUCOSE, CAPILLARY
Glucose-Capillary: 117 mg/dL — ABNORMAL HIGH (ref 70–99)
Glucose-Capillary: 162 mg/dL — ABNORMAL HIGH (ref 70–99)

## 2023-01-28 MED ORDER — GUAIFENESIN ER 600 MG PO TB12: 600.0000 mg | ORAL_TABLET | Freq: Two times a day (BID) | ORAL | 0 refills | Status: DC

## 2023-01-28 MED ORDER — PREDNISONE 10 MG PO TABS: ORAL_TABLET | ORAL | 0 refills | Status: DC

## 2023-01-28 MED ORDER — DOXYCYCLINE HYCLATE 50 MG PO CAPS: 50.0000 mg | ORAL_CAPSULE | Freq: Two times a day (BID) | ORAL | 0 refills | Status: AC

## 2023-01-28 NOTE — Hospital Course (Addendum)
77 y.o. male with medical history significant of COPD on O2 2L chronically, A-fib not on anticoagulant sp watchman, DM2 CHF diastolic HLD anemia who presented with   shortness of breath and cough Recently admitted with COPD exacerbation has known history of A-fib not anticoagulated given history of hematuria presented with 12 hours of feeling palpitations found to have A-fib with RVR heart rate 170s reports increased shortness of breath and some coughing never completed recovered after his recent admission for COPD.   Patient was admitted and [name], back in sinus rhythm diltiazem drip was discontinued, echo showed EF 65 to 70%.  TSH normal.  PE study negative. Patient was monitored for additional day to ensure heart rate remained stable prior to discharge to home with palliative care services resumption.

## 2023-01-28 NOTE — Consult Note (Signed)
   [facility] Inpatient Consult   01/28/2023  Paul [name] November 01, 1946 [id number]  [facility] [ACO] Patient: Medicare ACO REACH   Primary Care Provider:  [name], MD   Patient screened for for less than 7 days readmission hospitalization with noted high risk score for unplanned readmission risk to assess for potential [place] Management service needs for post hospital transition for care coordination.  Review of patient's electronic medical record reveals patient is active with [facility] in [facility] Palliative program.   Plan:  High risk readmission prevention  Of note, [facility] does not replace or interfere with any arrangements made by the Inpatient Transition of Care team.  For questions contact:   Natividad Brood, RN BSN [place]  [phone] business mobile phone Toll free office [phone]  *[place]  [phone] Fax number: [phone] [email or web address]

## 2023-01-28 NOTE — Discharge Summary (Signed)
Physician Discharge Summary  Paul [name]:[id number] DOB: 06/25/1946 DOA: 01/25/2023  PCP: Paul Brome, MD  Admit date: 01/25/2023 Discharge date: 01/28/2023 Recommendations for Outpatient Follow-up:  Follow up with PCP in 1 weeks-call for appointment Follow-up with Dr. Quentin [name] from [facility] Please obtain BMP/CBC in one week  Discharge Dispo: Home Discharge Condition: Stable Code Status:   Code Status: Full Code Diet recommendation:  Diet Order             Diet Carb Modified Fluid consistency: Thin; Room service appropriate? Yes  Diet effective now                    Brief/Interim Summary:  77 y.o. male with medical history significant of COPD on O2 2L chronically, A-fib not on anticoagulant sp watchman, DM2 CHF diastolic HLD anemia who presented with   shortness of breath and cough Recently admitted with COPD exacerbation has known history of A-fib not anticoagulated given history of hematuria presented with 12 hours of feeling palpitations found to have A-fib with RVR heart rate 170s reports increased shortness of breath and some coughing never completed recovered after his recent admission for COPD.   Patient was admitted and Paul [name], back in sinus rhythm diltiazem drip was discontinued, echo showed EF 65 to 70%.  TSH normal.  PE study negative. Patient was monitored for additional day to ensure heart rate remained stable prior to discharge to home with palliative care services resumption. Seen this morning resting well eager to go home today.  Discharge Diagnoses:  Principal Problem:   Atrial fibrillation with RVR (HCC) Active Problems:   HLD (hyperlipidemia)   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HCC)   Chronic diastolic CHF (congestive heart failure) (HCC)   Chronic respiratory failure with hypoxia (HCC)   COPD with acute exacerbation (HCC)   Elevated d-dimer  A-fib with RVR resolved now in sinus rhythm.  TSH normal PA.  PE study normal.  Echo with normal  EF 65 to 70% G1 DD.  Discussed with Dr. Rufina [name] > discontinue bisoprolol on discharge did endorse some dizziness yesterday.  Previously did not tolerate beta-blocker due to "passing out" Status post Watchman device. hR is stable. He will follow-up with [facility] cardiology for further plan.  Acute COPD exacerbation continue steroid taper oral antibiotics and home inhalers follow-up with pulmonary as outpatient.  Added antitussives.  Ambulating well without need for oxygen or shortness of breath.   Chronic hypoxic respiratory failure continue home oxygen 2 L T2DM with mixed hyperlipidemia stable blood sugar resume home meds upon discharge continue statin Elevated dimer done but negative for PE studies Chronic diastolic CHF stable   Consults: Cardiology  Subjective: Alert awake oriented resting comfortably on room air.  He is eager to go home today.  Discharge Exam: Vitals:   01/28/23 0851 01/28/23 1149  BP: 138/71 (!) 144/65  Pulse: 82 84  Resp: 16 17  Temp: 97.8 F (36.6 C) (!) 97.4 F (36.3 C)  SpO2: 94% 90%   General: Pt is alert, awake, not in acute distress Cardiovascular: RRR, S1/S2 +, no rubs, no gallops Respiratory: CTA bilaterally, no wheezing, no rhonchi Abdominal: Soft, NT, ND, bowel sounds + Extremities: no edema, no cyanosis  Discharge Instructions  Discharge Instructions     Discharge instructions   Complete by: As directed    Please call call MD or return to ER for similar or worsening recurring problem that brought you to hospital or if any fever,nausea/vomiting,abdominal pain, uncontrolled  pain, chest pain,  shortness of breath or any other alarming symptoms.  Please follow-up your doctor as instructed in a week time and call the office for appointment.  Please avoid alcohol, smoking, or any other illicit substance and maintain healthy habits including taking your regular medications as prescribed.  You were cared for by a hospitalist during your hospital  stay. If you have any questions about your discharge medications or the care you received while you were in the hospital after you are discharged, you can call the unit and ask to speak with the hospitalist on call if the hospitalist that took care of you is not available.  Once you are discharged, your primary care physician will handle any further medical issues. Please note that NO REFILLS for any discharge medications will be authorized once you are discharged, as it is imperative that you return to your primary care physician (or establish a relationship with a primary care physician if you do not have one) for your aftercare needs so that they can reassess your need for medications and monitor your lab values   Increase activity slowly   Complete by: As directed       Allergies as of 01/28/2023       Reactions   Tape Other (See Comments)   PLEASE USE COBAN WRAP IN LIEU OF "TAPE," as it "pulls off the skin!!   Daliresp [roflumilast] Diarrhea, Nausea And Vomiting   Levaquin [levofloxacin] Palpitations, Other (See Comments)   Made the B/P fluctuate and heart raced   Alendronate Anxiety, Other (See Comments)   Jittery/nervous        Medication List     TAKE these medications    acetaminophen 500 MG tablet Commonly known as: TYLENOL Take 1,000 mg by mouth at bedtime as needed (pain).   ALPRAZolam 0.5 MG tablet Commonly known as: XANAX Take 1 tablet (0.5 mg total) by mouth 2 (two) times daily as needed for anxiety.   aspirin EC 81 MG tablet Take 1 tablet (81 mg total) by mouth daily. Swallow whole.   atorvastatin 40 MG tablet Commonly known as: LIPITOR TAKE 1 TABLET BY MOUTH EVERYDAY AT BEDTIME What changed: See the new instructions.   Breztri Aerosphere 160-9-4.8 MCG/ACT Aero Generic drug: Budeson-Glycopyrrol-Formoterol Inhale 2 puffs into the lungs 2 (two) times daily.   calcium carbonate 600 MG Tabs tablet Commonly known as: OS-CAL Take 600 mg by mouth daily.    denosumab 60 MG/ML Sosy injection Commonly known as: PROLIA Inject 60 mg into the skin every 6 (six) months.   doxycycline 50 MG capsule Commonly known as: VIBRAMYCIN Take 1 capsule (50 mg total) by mouth 2 (two) times daily for 5 days.   ferrous sulfate 325 (65 FE) MG tablet Take 325 mg by mouth 2 (two) times daily with a meal.   fluticasone 50 MCG/ACT nasal spray Commonly known as: FLONASE Place 2 sprays into both nostrils daily as needed for allergies or rhinitis.   guaiFENesin 600 MG 12 hr tablet Commonly known as: MUCINEX Take 1 tablet (600 mg total) by mouth 2 (two) times daily for 7 days.   loratadine 10 MG tablet Commonly known as: CLARITIN Take 10 mg by mouth in the morning.   Melatonin 12 MG Tabs Take 12 mg by mouth at bedtime.   multivitamin with minerals Tabs tablet Take 1 tablet by mouth daily with breakfast.   OXYGEN Inhale 2 L/min into the lungs at bedtime.   pantoprazole 40 MG tablet Commonly known  as: PROTONIX Take 1 tablet (40 mg total) by mouth 2 (two) times daily.   predniSONE 10 MG tablet Commonly known as: DELTASONE Take PO 4 tabs daily x 2 days,3 tabs daily x 2 days,2 tabs daily x 2 days,1 tab daily as hoem dose they way you were doing What changed: additional instructions   ProAir HFA 108 (90 Base) MCG/ACT inhaler Generic drug: albuterol Inhale 2 puffs into the lungs as needed for wheezing or shortness of breath.   albuterol (2.5 MG/3ML) 0.083% nebulizer solution Commonly known as: PROVENTIL Take 3 mLs (2.5 mg total) by nebulization every 6 (six) hours as needed for shortness of breath or wheezing.   Probiotic Daily Caps Take 1 capsule by mouth in the morning.   sertraline 50 MG tablet Commonly known as: ZOLOFT Take 1 tablet (50 mg total) by mouth at bedtime.   vitamin C 1000 MG tablet Take 1,000 mg by mouth daily.   Vitamin D3 50 MCG (2000 UT) Tabs Take 2,000 Units by mouth in the morning.               Durable Medical  Equipment  (From admission, onward)           Start     Ordered   01/27/23 1352  For home use only DME oxygen  Once       Question Answer Comment  Length of Need Lifetime   Mode or (Route) Nasal cannula   Liters per Minute 2   Frequency Continuous (stationary and portable oxygen unit needed)   Oxygen conserving device Yes   Oxygen delivery system Gas      01/27/23 1351            Follow-up Information     Vickie Epley, MD Follow up on 02/18/2023.   Specialties: Cardiology, Radiology Why: at 10:15 AM  please check in 15 min early if possible this is to discuss watchman device Contact information: [address] [address] [phone]         Paul Brome, MD Follow up in 1 week(s).   Specialty: Family Medicine Contact information: [address] [place] [address] [phone]                Allergies  Allergen Reactions   Tape Other (See Comments)    PLEASE USE COBAN WRAP IN LIEU OF "TAPE," as it "pulls off the skin!!   Daliresp [Roflumilast] Diarrhea and Nausea And Vomiting   Levaquin [Levofloxacin] Palpitations and Other (See Comments)    Made the B/P fluctuate and heart raced   Alendronate Anxiety and Other (See Comments)    Jittery/nervous    The results of significant diagnostics from this hospitalization (including imaging, microbiology, ancillary and laboratory) are listed below for reference.    Microbiology: Recent Results (from the past 240 hour(s))  Resp panel by RT-PCR (RSV, Flu A&B, Covid) Anterior Nasal Swab     Status: None   Collection Time: 01/25/23  6:00 PM   Specimen: Anterior Nasal Swab  Result Value Ref Range Status   SARS Coronavirus 2 by RT PCR NEGATIVE NEGATIVE Final   Influenza A by PCR NEGATIVE NEGATIVE Final   Influenza B by PCR NEGATIVE NEGATIVE Final    Comment: (NOTE) The Xpert Xpress SARS-CoV-2/FLU/RSV plus assay is intended as an aid in the diagnosis of influenza from  Nasopharyngeal swab specimens and should not be used as a sole basis for treatment. Nasal washings and  aspirates are unacceptable for Xpert Xpress SARS-CoV-2/FLU/RSV testing.  Fact Sheet for Patients: EntrepreneurPulse.com.au  Fact Sheet for Healthcare Providers: IncredibleEmployment.be  This test is not yet approved or cleared by the [place] FDA and has been authorized for detection and/or diagnosis of SARS-CoV-2 by FDA under an Emergency Use Authorization (EUA). This EUA will remain in effect (meaning this test can be used) for the duration of the COVID-19 declaration under Section 564(b)(1) of the Act, 21 U.S.C. section 360bbb-3(b)(1), unless the authorization is terminated or revoked.     Resp Syncytial Virus by PCR NEGATIVE NEGATIVE Final    Comment: (NOTE) Fact Sheet for Patients: EntrepreneurPulse.com.au  Fact Sheet for Healthcare Providers: IncredibleEmployment.be  This test is not yet approved or cleared by the [place] FDA and has been authorized for detection and/or diagnosis of SARS-CoV-2 by FDA under an Emergency Use Authorization (EUA). This EUA will remain in effect (meaning this test can be used) for the duration of the COVID-19 declaration under Section 564(b)(1) of the Act, 21 U.S.C. section 360bbb-3(b)(1), unless the authorization is terminated or revoked.  Performed at [facility], [place] [address] [place], [place] [address]   MRSA Next Gen by PCR, Nasal     Status: None   Collection Time: 01/25/23  9:36 PM   Specimen: Nasal Mucosa; Nasal Swab  Result Value Ref Range Status   MRSA by PCR Next Gen NOT DETECTED NOT DETECTED Final    Comment: (NOTE) The GeneXpert MRSA Assay (FDA approved for NASAL specimens only), is one component of a comprehensive MRSA colonization surveillance program. It is not intended to diagnose MRSA infection nor to guide or monitor treatment  for MRSA infections. Test performance is not FDA approved in patients less than 50 years old. Performed at [facility], [place] [address] [place], [place] [address]     Procedures/Studies: VAS [place] LOWER EXTREMITY VENOUS (DVT)  Result Date: 01/27/2023  Lower Venous DVT Study Patient Name:  Paul [name]  Date of Exam:   01/26/2023 Medical Rec #: [id number]       Accession #:    [id number] Date of Birth: [birth year]        Patient Gender: M Patient Age:   77 years Exam Location:  [facility] Procedure:      VAS [place] LOWER EXTREMITY VENOUS (DVT) Referring Phys: Nyoka Lint DOUTOVA --------------------------------------------------------------------------------  Indications: Edema.  Comparison Study: No prior studies. Performing Technologist: Darlin Coco RDMS, RVT  Examination Guidelines: A complete evaluation includes B-mode imaging, spectral Doppler, color Doppler, and power Doppler as needed of all accessible portions of each vessel. Bilateral testing is considered an integral part of a complete examination. Limited examinations for reoccurring indications may be performed as noted. The reflux portion of the exam is performed with the patient in reverse Trendelenburg.  +---------+---------------+---------+-----------+----------+--------------+ RIGHT    CompressibilityPhasicitySpontaneityPropertiesThrombus Aging +---------+---------------+---------+-----------+----------+--------------+ CFV      Full           Yes      Yes                                 +---------+---------------+---------+-----------+----------+--------------+ SFJ      Full                                                        +---------+---------------+---------+-----------+----------+--------------+  FV Prox  Full                                                        +---------+---------------+---------+-----------+----------+--------------+ FV Mid   Full                                                         +---------+---------------+---------+-----------+----------+--------------+ FV DistalFull                                                        +---------+---------------+---------+-----------+----------+--------------+ PFV      Full                                                        +---------+---------------+---------+-----------+----------+--------------+ POP      Full           Yes      Yes                                 +---------+---------------+---------+-----------+----------+--------------+ PTV      Full                                                        +---------+---------------+---------+-----------+----------+--------------+ PERO     Full                                                        +---------+---------------+---------+-----------+----------+--------------+   +---------+---------------+---------+-----------+----------+--------------+ LEFT     CompressibilityPhasicitySpontaneityPropertiesThrombus Aging +---------+---------------+---------+-----------+----------+--------------+ CFV      Full           Yes      Yes                                 +---------+---------------+---------+-----------+----------+--------------+ SFJ      Full                                                        +---------+---------------+---------+-----------+----------+--------------+ FV Prox  Full                                                        +---------+---------------+---------+-----------+----------+--------------+  FV Mid   Full                                                        +---------+---------------+---------+-----------+----------+--------------+ FV DistalFull                                                        +---------+---------------+---------+-----------+----------+--------------+ PFV      Full                                                         +---------+---------------+---------+-----------+----------+--------------+ POP      Full           Yes      Yes                                 +---------+---------------+---------+-----------+----------+--------------+ PTV      Full                                                        +---------+---------------+---------+-----------+----------+--------------+ PERO     Full                                                        +---------+---------------+---------+-----------+----------+--------------+     Summary: RIGHT: - There is no evidence of deep vein thrombosis in the lower extremity.  - No cystic structure found in the popliteal fossa.  LEFT: - There is no evidence of deep vein thrombosis in the lower extremity.  - No cystic structure found in the popliteal fossa.  *See table(s) above for measurements and observations. Electronically signed by Servando Snare MD on 01/27/2023 at 2:40:04 PM.    Final    ECHOCARDIOGRAM COMPLETE  Result Date: 01/26/2023    ECHOCARDIOGRAM REPORT   Patient Name:   Paul [name] Date of Exam: 01/26/2023 Medical Rec #:  [id number]      Height:       66.0 in Accession #:    [id number]     Weight:       161.6 lb Date of Birth:  11/25/1946       BSA:          1.827 m Patient Age:    50 years       BP:           120/50 mmHg Patient Gender: M              HR:           84 bpm. Exam Location:  Inpatient Procedure: 2D Echo, Cardiac Doppler, Color Doppler and Intracardiac  Opacification Agent Indications:    Elevated troponin  History:        Patient has prior history of Echocardiogram examinations, most                 recent 09/13/2021. COPD, Arrythmias:Atrial Fibrillation; Risk                 Factors:Diabetes and Hypertension. 32m Watchman Device                 implanted 08/02/21.  Sonographer:    AClayton LefortRDCS (AE) Referring Phys: AToy Baker Sonographer Comments: Technically difficult study due to poor echo windows. IMPRESSIONS  1. Left  ventricular ejection fraction, by estimation, is 65 to 70%. The left ventricle has normal function. The left ventricle has no regional wall motion abnormalities. Left ventricular diastolic parameters are consistent with Grade I diastolic dysfunction (impaired relaxation).  2. Right ventricular systolic function is low normal. The right ventricular size is normal. The estimated right ventricular systolic pressure is 125.0mmHg.  3. The mitral valve is grossly normal. Trivial mitral valve regurgitation.  4. The aortic valve is tricuspid. Aortic valve regurgitation is trivial. Aortic valve sclerosis is present, with no evidence of aortic valve stenosis.  5. The inferior vena cava is normal in size with greater than 50% respiratory variability, suggesting right atrial pressure of 3 mmHg. Comparison(s): Changes from prior study are noted. 09/13/2021: LVEF 60-65%. FINDINGS  Left Ventricle: Left ventricular ejection fraction, by estimation, is 65 to 70%. The left ventricle has normal function. The left ventricle has no regional wall motion abnormalities. Definity contrast agent was given IV to delineate the left ventricular  endocardial borders. The left ventricular internal cavity size was normal in size. There is no left ventricular hypertrophy. Left ventricular diastolic parameters are consistent with Grade I diastolic dysfunction (impaired relaxation). Indeterminate filling pressures. Right Ventricle: The right ventricular size is normal. No increase in right ventricular wall thickness. Right ventricular systolic function is low normal. The tricuspid regurgitant velocity is 2.04 m/s, and with an assumed right atrial pressure of 3 mmHg, the estimated right ventricular systolic pressure is 153.9mmHg. Left Atrium: Left atrial size was normal in size. Right Atrium: Right atrial size was normal in size. Pericardium: There is no evidence of pericardial effusion. Mitral Valve: The mitral valve is grossly normal. Trivial mitral  valve regurgitation. Tricuspid Valve: The tricuspid valve is grossly normal. Tricuspid valve regurgitation is trivial. Aortic Valve: The aortic valve is tricuspid. Aortic valve regurgitation is trivial. Aortic valve sclerosis is present, with no evidence of aortic valve stenosis. Aortic valve mean gradient measures 1.0 mmHg. Aortic valve peak gradient measures 2.6 mmHg. Aortic valve area, by VTI measures 2.83 cm. Pulmonic Valve: The pulmonic valve was normal in structure. Pulmonic valve regurgitation is not visualized. Aorta: The aortic root and ascending aorta are structurally normal, with no evidence of dilitation. Venous: The inferior vena cava is normal in size with greater than 50% respiratory variability, suggesting right atrial pressure of 3 mmHg. IAS/Shunts: No atrial level shunt detected by color flow Doppler.  LEFT VENTRICLE PLAX 2D LVIDd:         4.20 cm   Diastology LVIDs:         2.60 cm   LV e' medial:    5.87 cm/s LV PW:         1.10 cm   LV E/e' medial:  10.4 LV IVS:        1.00 cm  LV e' lateral:   8.49 cm/s LVOT diam:     2.20 cm   LV E/e' lateral: 7.2 LV SV:         45 LV SV Index:   25 LVOT Area:     3.80 cm  RIGHT VENTRICLE             IVC RV Basal diam:  2.60 cm     IVC diam: 1.60 cm RV S prime:     10.20 cm/s TAPSE (M-mode): 2.0 cm LEFT ATRIUM           Index        RIGHT ATRIUM           Index LA diam:      3.00 cm 1.64 cm/m   RA Area:     11.40 cm LA Vol (A2C): 29.7 ml 16.26 ml/m  RA Volume:   24.40 ml  13.36 ml/m LA Vol (A4C): 36.7 ml 20.09 ml/m  AORTIC VALVE AV Area (Vmax):    3.37 cm AV Area (Vmean):   3.08 cm AV Area (VTI):     2.83 cm AV Vmax:           80.40 cm/s AV Vmean:          52.500 cm/s AV VTI:            0.160 m AV Peak Grad:      2.6 mmHg AV Mean Grad:      1.0 mmHg LVOT Vmax:         71.30 cm/s LVOT Vmean:        42.600 cm/s LVOT VTI:          0.119 m LVOT/AV VTI ratio: 0.74  AORTA Ao Root diam: 3.20 cm Ao Asc diam:  2.80 cm MITRAL VALVE               TRICUSPID  VALVE MV Area (PHT): 3.66 cm    TR Peak grad:   16.6 mmHg MV Decel Time: 207 msec    TR Vmax:        204.00 cm/s MV E velocity: 61.30 cm/s MV A velocity: 63.20 cm/s  SHUNTS MV E/A ratio:  0.97        Systemic VTI:  0.12 m                            Systemic Diam: 2.20 cm Lyman Bishop MD Electronically signed by Lyman Bishop MD Signature Date/Time: 01/26/2023/4:02:20 PM    Final    CT Angio Chest Pulmonary Embolism (PE) W or WO Contrast  Result Date: 01/25/2023 CLINICAL DATA:  Pulmonary embolism (PE) suspected, high prob EXAM: CT ANGIOGRAPHY CHEST WITH CONTRAST TECHNIQUE: Multidetector CT imaging of the chest was performed using the standard protocol during bolus administration of intravenous contrast. Multiplanar CT image reconstructions and MIPs were obtained to evaluate the vascular anatomy. RADIATION DOSE REDUCTION: This exam was performed according to the departmental dose-optimization program which includes automated exposure control, adjustment of the mA and/or kV according to patient size and/or use of iterative reconstruction technique. CONTRAST:  63m OMNIPAQUE IOHEXOL 350 MG/ML SOLN COMPARISON:  07/17/2021 FINDINGS: Cardiovascular: No filling defects in the pulmonary arteries to suggest pulmonary emboli. Heart is normal size. Aorta is normal caliber. Scattered coronary artery and aortic calcifications. Mediastinum/Nodes: No mediastinal, hilar, or axillary adenopathy. Trachea and esophagus are unremarkable. Thyroid unremarkable. Lungs/Pleura: Centrilobular emphysema. No confluent opacities or effusions. Upper Abdomen: No  acute findings Musculoskeletal: Chest wall soft tissues are unremarkable. No acute bony abnormality. Review of the MIP images confirms the above findings. IMPRESSION: No evidence of pulmonary embolus. Coronary artery disease. Aortic Atherosclerosis (ICD10-I70.0) and Emphysema (ICD10-J43.9). Electronically Signed   By: Rolm Baptise M.D.   On: 01/25/2023 20:33   DG Chest Portable 1  View  Result Date: 01/25/2023 CLINICAL DATA:  Shortness of breath and chest pain. EXAM: PORTABLE CHEST 1 VIEW COMPARISON:  January 16, 2023 FINDINGS: Cardiomediastinal silhouette is normal. Mediastinal contours appear intact. There is no evidence of focal airspace consolidation, pleural effusion or pneumothorax. Osseous structures are without acute abnormality. Soft tissues are grossly normal. IMPRESSION: No active disease. Electronically Signed   By: Fidela Salisbury M.D.   On: 01/25/2023 15:44   DG Chest Port 1 View  Result Date: 01/16/2023 CLINICAL DATA:  Shortness of breath, history COPD, worsening symptoms since beginning of January following the flu EXAM: PORTABLE CHEST 1 VIEW COMPARISON:  Portable exam 1130 hours compared to 07/16/2022 FINDINGS: Normal heart size, mediastinal contours, and pulmonary vascularity. Atherosclerotic calcification aorta. Lungs emphysematous but clear. No pulmonary infiltrate, pleural effusion, or pneumothorax. Bones demineralized. IMPRESSION: Emphysematous changes without acute infiltrate. Aortic Atherosclerosis (ICD10-I70.0) and Emphysema (ICD10-J43.9). Electronically Signed   By: Lavonia Dana M.D.   On: 01/16/2023 12:00    Labs: BNP (last 3 results) Recent Labs    03/02/22 1000 07/16/22 1320 01/16/23 1134  BNP 107.5* 48.8 66.4   Basic Metabolic Panel: Recent Labs  Lab 01/22/23 1404 01/25/23 1509 01/25/23 1953 01/25/23 2000 01/26/23 0154 01/27/23 0138  NA 138 138  --  137 136 136  K 5.7* 4.2  --  4.5 4.4 4.4  CL 93* 95*  --   --  95* 99  CO2 29 31  --   --  29 28  GLUCOSE 153* 136*  --   --  241* 113*  BUN 14 12  --   --  18 24*  CREATININE 0.72* 0.95  --   --  0.91 0.87  CALCIUM 9.6 9.2  --   --  8.6* 8.4*  MG  --   --  2.1  --  2.0 2.1  PHOS  --   --  3.8  --  4.3  --    Liver Function Tests: Recent Labs  Lab 01/22/23 1404 01/25/23 1953 01/26/23 0154  AST 31 30 32  ALT 56* 48* 43  ALKPHOS 83 59 58  BILITOT 0.3 0.6 0.3  PROT 6.6  6.1* 5.4*  ALBUMIN 4.4 3.2* 3.0*   No results for input(s): "LIPASE", "AMYLASE" in the last 168 hours. No results for input(s): "AMMONIA" in the last 168 hours. CBC: Recent Labs  Lab 01/22/23 1404 01/25/23 1509 01/25/23 1509 01/25/23 2000 01/25/23 2226 01/26/23 0154 01/27/23 0138 01/28/23 0252  WBC 14.4* 15.1*  --   --  11.4* 11.8* 14.0* 11.2*  NEUTROABS 13.6*  --   --   --  10.3*  --   --   --   HGB 13.4 13.6   < > 12.9* 12.1* 11.6* 11.1* 12.2*  HCT 42.5 44.1   < > 38.0* 37.4* 37.7* 36.3* 39.2  MCV 82 84.8  --   --  81.8 84.2 84.2 84.5  PLT 268 279  --   --  228 224 244 247   < > = values in this interval not displayed.   Cardiac Enzymes: Recent Labs  Lab 01/25/23 1953  CKTOTAL 56   BNP: Invalid  input(s): "POCBNP" CBG: Recent Labs  Lab 01/27/23 1230 01/27/23 1657 01/27/23 2058 01/28/23 0753 01/28/23 1152  GLUCAP 155* 139* 183* 117* 162*   D-Dimer Recent Labs    01/25/23 1509  DDIMER 2.03*   Hgb A1c No results for input(s): "HGBA1C" in the last 72 hours. Lipid Profile No results for input(s): "CHOL", "HDL", "LDLCALC", "TRIG", "CHOLHDL", "LDLDIRECT" in the last 72 hours. Thyroid function studies Recent Labs    01/25/23 1953  TSH 0.792   Anemia work up No results for input(s): "VITAMINB12", "FOLATE", "FERRITIN", "TIBC", "IRON", "RETICCTPCT" in the last 72 hours. Urinalysis    Component Value Date/Time   COLORURINE YELLOW 01/25/2023 2000   APPEARANCEUR CLEAR 01/25/2023 2000   LABSPEC 1.013 01/25/2023 2000   PHURINE 6.0 01/25/2023 2000   GLUCOSEU NEGATIVE 01/25/2023 2000   HGBUR NEGATIVE 01/25/2023 2000   BILIRUBINUR NEGATIVE 01/25/2023 2000   BILIRUBINUR Negative 05/10/2021 [place] 01/25/2023 2000   PROTEINUR NEGATIVE 01/25/2023 2000   UROBILINOGEN negative (A) 05/10/2021 0931   NITRITE NEGATIVE 01/25/2023 2000   LEUKOCYTESUR NEGATIVE 01/25/2023 2000   Sepsis Labs Recent Labs  Lab 01/25/23 2226 01/26/23 0154  01/27/23 0138 01/28/23 0252  WBC 11.4* 11.8* 14.0* 11.2*   Microbiology Recent Results (from the past 240 hour(s))  Resp panel by RT-PCR (RSV, Flu A&B, Covid) Anterior Nasal Swab     Status: None   Collection Time: 01/25/23  6:00 PM   Specimen: Anterior Nasal Swab  Result Value Ref Range Status   SARS Coronavirus 2 by RT PCR NEGATIVE NEGATIVE Final   Influenza A by PCR NEGATIVE NEGATIVE Final   Influenza B by PCR NEGATIVE NEGATIVE Final    Comment: (NOTE) The Xpert Xpress SARS-CoV-2/FLU/RSV plus assay is intended as an aid in the diagnosis of influenza from Nasopharyngeal swab specimens and should not be used as a sole basis for treatment. Nasal washings and aspirates are unacceptable for Xpert Xpress SARS-CoV-2/FLU/RSV testing.  Fact Sheet for Patients: EntrepreneurPulse.com.au  Fact Sheet for Healthcare Providers: IncredibleEmployment.be  This test is not yet approved or cleared by the [place] FDA and has been authorized for detection and/or diagnosis of SARS-CoV-2 by FDA under an Emergency Use Authorization (EUA). This EUA will remain in effect (meaning this test can be used) for the duration of the COVID-19 declaration under Section 564(b)(1) of the Act, 21 U.S.C. section 360bbb-3(b)(1), unless the authorization is terminated or revoked.     Resp Syncytial Virus by PCR NEGATIVE NEGATIVE Final    Comment: (NOTE) Fact Sheet for Patients: EntrepreneurPulse.com.au  Fact Sheet for Healthcare Providers: IncredibleEmployment.be  This test is not yet approved or cleared by the [place] FDA and has been authorized for detection and/or diagnosis of SARS-CoV-2 by FDA under an Emergency Use Authorization (EUA). This EUA will remain in effect (meaning this test can be used) for the duration of the COVID-19 declaration under Section 564(b)(1) of the Act, 21 U.S.C. section 360bbb-3(b)(1), unless the  authorization is terminated or revoked.  Performed at [facility], [place] [address], [place], [place] [address]   MRSA Next Gen by PCR, Nasal     Status: None   Collection Time: 01/25/23  9:36 PM   Specimen: Nasal Mucosa; Nasal Swab  Result Value Ref Range Status   MRSA by PCR Next Gen NOT DETECTED NOT DETECTED Final    Comment: (NOTE) The GeneXpert MRSA Assay (FDA approved for NASAL specimens only), is one component of a comprehensive MRSA colonization surveillance program. It  is not intended to diagnose MRSA infection nor to guide or monitor treatment for MRSA infections. Test performance is not FDA approved in patients less than 35 years old. Performed at [facility], [place] [address], [place], [place] [address]   Time coordinating discharge: 25 minutes SIGNED: Antonieta Pert, MD  [facility] 01/28/2023, 12:50 PM  If 7PM-7AM, please contact night-coverage [email or web address]

## 2023-01-29 ENCOUNTER — Telehealth: Payer: Self-pay | Admitting: *Deleted

## 2023-01-29 ENCOUNTER — Encounter: Payer: Self-pay | Admitting: *Deleted

## 2023-01-29 NOTE — Patient Outreach (Signed)
Care Coordination [facility] Note Transition Care Management Follow-up Telephone Call Date of discharge and from where: Tuesday, 01/28/23 Paul [name]; A-Fib with [facility] How have you been since you were released from the hospital? "I am doing fine; I feel much better.  Using my home oxygen only at night, haven't needed it during the day.  I got and am taking all of my medicines.  My wife is here to help if I need anything, but I am doing fine taking care of myself on my own.  My palliative care nurse Paul [name] comes in about every 3 weeks to check on me." Any questions or concerns? No  Items Reviewed: Did the pt receive and understand the discharge instructions provided? Yes  thoroughly reviewed with patient today Medications obtained and verified? Yes  full medication review as below/ interventions Other? No  Any new allergies since your discharge? No  Dietary orders reviewed? Yes Do you have support at home? Yes  patient reports independent in self-care activities; wife assists as/if needed  Home Care and Equipment/Supplies: Were home health services ordered? no If so, what is the name of the agency? N/A  Has the agency set up a time to come to the patient's home? not applicable Were any new equipment or medical supplies ordered?  No What is the name of the medical supply agency? N/A Were you able to get the supplies/equipment? not applicable Do you have any questions related to the use of the equipment or supplies? No N/A  Functional Questionnaire: (I = Independent and D = Dependent) ADLs: I  Bathing/Dressing- I  Meal Prep- I  Eating- I  Maintaining continence- I  Transferring/Ambulation- I  Managing Meds- I  wife assists with medication management  Follow up appointments reviewed:  PCP Hospital f/u appt confirmed? Yes  Scheduled to see PCP on Wednesday, 02/05/23 @ 1:30 pm Specialist Hospital f/u appt confirmed? Yes  Scheduled to see pulmonary provider on Monday, 02/10/23 @ 8:45 am Are  transportation arrangements needed? No  If their condition worsens, is the pt aware to call PCP or go to the Emergency Dept.? Yes Was the patient provided with contact information for the PCP's office or ED? No declined- reports already has contact information for all care providers Was to pt encouraged to call back with questions or concerns? Yes  SDOH assessments and interventions completed:   Yes SDOH Interventions Today    Flowsheet Row Most Recent Value  SDOH Interventions   Food Insecurity Interventions Intervention Not Indicated  Transportation Interventions Intervention Not Indicated  [drives self]      Interventions Today    Flowsheet Row Most Recent Value  Chronic Disease Discussed/Reviewed   Chronic disease discussed/reviewed during today's visit Atrial Fibrillation (AFib)  General Interventions   General Interventions Discussed/Reviewed General Interventions Discussed, Doctor Visits  Doctor Visits Discussed/Reviewed Doctor Visits Discussed  Durable Medical Equipment (DME) Oxygen  [uses home O2 at night at baseline]  Education Interventions   Education Provided Provided Verbal Education  [importance of attending doctor appointments as scheduled]  Provided Verbal Education On Nutrition  Nutrition Interventions   Nutrition Discussed/Reviewed Nutrition Discussed  Pharmacy Interventions   Pharmacy Dicussed/Reviewed Pharmacy Topics Discussed  [Full medication review completed,  no discrepancies/ concerns  identified,  confirmed patient obtained/ is taking all newly prescribed medications post-discharge 01/28/23,  he denies questions/ concerns around meds,  wife assists with med management]      Care Coordination Interventions:  Full medication review completed; medication list in EHR updated according  to patient report    Encounter Outcome:  Pt. Visit Completed    Oneta Rack, RN, BSN, CCRN Alumnus RN CM Care Coordination/ Transition of [place]  Management [phone]: direct office

## 2023-01-29 NOTE — Progress Notes (Signed)
  Care Coordination   Note   01/29/2023 Name: CANDLER GINSBERG MRN: [id number] DOB: 01/27/1946  SERVANDO KYLLONEN is a 77 y.o. year old male who sees Cox, Kirsten, MD for primary care. I reached out to [name] by phone today to offer care coordination services.  Mr. Mccarry was given information about Care Coordination services today including:   The Care Coordination services include support from the care team which includes your Nurse Coordinator, Clinical Social Worker, or Pharmacist.  The Care Coordination team is here to help remove barriers to the health concerns and goals most important to you. Care Coordination services are voluntary, and the patient may decline or stop services at any time by request to their care team member.   Care Coordination Consent Status: Patient agreed to services and verbal consent obtained.   Follow up plan:  Telephone appointment with care coordination team member scheduled for:  01/30/2023  Encounter Outcome:  Pt. Scheduled from referral   Julian Hy, [place] Direct Dial: [phone]

## 2023-01-30 ENCOUNTER — Ambulatory Visit: Payer: Self-pay

## 2023-01-30 NOTE — Patient Outreach (Signed)
  Care Coordination   Initial Visit Note   01/30/2023 Name: METRO EDENFIELD MRN: [id number] DOB: [birth year]  CARDARIUS SENAT is a 77 y.o. year old male who sees Cox, Kirsten, MD for primary care. I spoke with  [name] by phone today.  What matters to the patients health and wellness today?  Placed call to patient to review [facility] care coordination program.  Reviewed with patient his recent call ( yesterday) with [facility] nurse.  Patient reports that he is feeling well and denies any concerns today. States he has all his medications, follow up appointment and denies concerns today. Reviewed with patient when to call 911 for his afib.  Encouraged patient to call me for any concerns in the future and he agreed.      SDOH assessments and interventions completed:  No     Care Coordination Interventions:  Yes, provided   Follow up plan: No further intervention required.   Encounter Outcome:  Pt. Visit Completed   Tomasa Rand, RN, BSN, CEN [place] Coordinator [phone]

## 2023-02-04 ENCOUNTER — Other Ambulatory Visit: Payer: Self-pay

## 2023-02-04 ENCOUNTER — Emergency Department (HOSPITAL_BASED_OUTPATIENT_CLINIC_OR_DEPARTMENT_OTHER): Payer: Medicare Other

## 2023-02-04 ENCOUNTER — Encounter (HOSPITAL_BASED_OUTPATIENT_CLINIC_OR_DEPARTMENT_OTHER): Payer: Self-pay | Admitting: Emergency Medicine

## 2023-02-04 ENCOUNTER — Inpatient Hospital Stay (HOSPITAL_BASED_OUTPATIENT_CLINIC_OR_DEPARTMENT_OTHER)
Admission: EM | Admit: 2023-02-04 | Discharge: 2023-02-07 | DRG: 291 | Disposition: A | Discharge status: Home / Self Care | Payer: Medicare Other | Attending: Internal Medicine | Admitting: Internal Medicine

## 2023-02-04 DIAGNOSIS — R600 Localized edema: Secondary | ICD-10-CM

## 2023-02-04 DIAGNOSIS — E7800 Pure hypercholesterolemia, unspecified: Secondary | ICD-10-CM | POA: Diagnosis present

## 2023-02-04 DIAGNOSIS — F419 Anxiety disorder, unspecified: Secondary | ICD-10-CM | POA: Diagnosis not present

## 2023-02-04 DIAGNOSIS — Z95818 Presence of other cardiac implants and grafts: Secondary | ICD-10-CM | POA: Diagnosis not present

## 2023-02-04 DIAGNOSIS — Z9981 Dependence on supplemental oxygen: Secondary | ICD-10-CM

## 2023-02-04 DIAGNOSIS — D638 Anemia in other chronic diseases classified elsewhere: Secondary | ICD-10-CM | POA: Diagnosis not present

## 2023-02-04 DIAGNOSIS — Z8616 Personal history of COVID-19: Secondary | ICD-10-CM | POA: Diagnosis not present

## 2023-02-04 DIAGNOSIS — R0789 Other chest pain: Secondary | ICD-10-CM | POA: Diagnosis not present

## 2023-02-04 DIAGNOSIS — J441 Chronic obstructive pulmonary disease with (acute) exacerbation: Secondary | ICD-10-CM | POA: Diagnosis present

## 2023-02-04 DIAGNOSIS — J440 Chronic obstructive pulmonary disease with acute lower respiratory infection: Secondary | ICD-10-CM | POA: Diagnosis present

## 2023-02-04 DIAGNOSIS — I509 Heart failure, unspecified: Secondary | ICD-10-CM

## 2023-02-04 DIAGNOSIS — F32A Depression, unspecified: Secondary | ICD-10-CM | POA: Diagnosis present

## 2023-02-04 DIAGNOSIS — I480 Paroxysmal atrial fibrillation: Secondary | ICD-10-CM | POA: Diagnosis present

## 2023-02-04 DIAGNOSIS — Z91048 Other nonmedicinal substance allergy status: Secondary | ICD-10-CM

## 2023-02-04 DIAGNOSIS — I110 Hypertensive heart disease with heart failure: Secondary | ICD-10-CM | POA: Diagnosis present

## 2023-02-04 DIAGNOSIS — J209 Acute bronchitis, unspecified: Secondary | ICD-10-CM | POA: Diagnosis present

## 2023-02-04 DIAGNOSIS — I5033 Acute on chronic diastolic (congestive) heart failure: Secondary | ICD-10-CM | POA: Diagnosis present

## 2023-02-04 DIAGNOSIS — Z1152 Encounter for screening for COVID-19: Secondary | ICD-10-CM | POA: Diagnosis not present

## 2023-02-04 DIAGNOSIS — Z888 Allergy status to other drugs, medicaments and biological substances status: Secondary | ICD-10-CM | POA: Diagnosis not present

## 2023-02-04 DIAGNOSIS — I5032 Chronic diastolic (congestive) heart failure: Secondary | ICD-10-CM | POA: Diagnosis present

## 2023-02-04 DIAGNOSIS — K219 Gastro-esophageal reflux disease without esophagitis: Secondary | ICD-10-CM | POA: Diagnosis present

## 2023-02-04 DIAGNOSIS — R079 Chest pain, unspecified: Principal | ICD-10-CM

## 2023-02-04 DIAGNOSIS — J9621 Acute and chronic respiratory failure with hypoxia: Secondary | ICD-10-CM | POA: Diagnosis not present

## 2023-02-04 DIAGNOSIS — Z7982 Long term (current) use of aspirin: Secondary | ICD-10-CM | POA: Diagnosis not present

## 2023-02-04 DIAGNOSIS — Z79899 Other long term (current) drug therapy: Secondary | ICD-10-CM | POA: Diagnosis not present

## 2023-02-04 DIAGNOSIS — J449 Chronic obstructive pulmonary disease, unspecified: Secondary | ICD-10-CM | POA: Diagnosis present

## 2023-02-04 DIAGNOSIS — R0902 Hypoxemia: Secondary | ICD-10-CM | POA: Diagnosis present

## 2023-02-04 DIAGNOSIS — R0600 Dyspnea, unspecified: Secondary | ICD-10-CM

## 2023-02-04 DIAGNOSIS — E119 Type 2 diabetes mellitus without complications: Secondary | ICD-10-CM | POA: Diagnosis present

## 2023-02-04 DIAGNOSIS — M810 Age-related osteoporosis without current pathological fracture: Secondary | ICD-10-CM | POA: Diagnosis present

## 2023-02-04 DIAGNOSIS — R0602 Shortness of breath: Secondary | ICD-10-CM | POA: Diagnosis not present

## 2023-02-04 DIAGNOSIS — Z8249 Family history of ischemic heart disease and other diseases of the circulatory system: Secondary | ICD-10-CM

## 2023-02-04 DIAGNOSIS — I4891 Unspecified atrial fibrillation: Secondary | ICD-10-CM | POA: Diagnosis present

## 2023-02-04 LAB — CBC WITH DIFFERENTIAL/PLATELET
Abs Immature Granulocytes: 0.11 10*3/uL — ABNORMAL HIGH (ref 0.00–0.07)
Basophils Absolute: 0 10*3/uL (ref 0.0–0.1)
Basophils Relative: 0 %
Eosinophils Absolute: 0.1 10*3/uL (ref 0.0–0.5)
Eosinophils Relative: 0 %
HCT: 40.4 % (ref 39.0–52.0)
Hemoglobin: 12.4 g/dL — ABNORMAL LOW (ref 13.0–17.0)
Immature Granulocytes: 1 %
Lymphocytes Relative: 5 %
Lymphs Abs: 0.6 10*3/uL — ABNORMAL LOW (ref 0.7–4.0)
MCH: 25.7 pg — ABNORMAL LOW (ref 26.0–34.0)
MCHC: 30.7 g/dL (ref 30.0–36.0)
MCV: 83.6 fL (ref 80.0–100.0)
Monocytes Absolute: 0.5 10*3/uL (ref 0.1–1.0)
Monocytes Relative: 4 %
Neutro Abs: 12.4 10*3/uL — ABNORMAL HIGH (ref 1.7–7.7)
Neutrophils Relative %: 90 %
Platelets: 230 10*3/uL (ref 150–400)
RBC: 4.83 MIL/uL (ref 4.22–5.81)
RDW: 15.3 % (ref 11.5–15.5)
WBC: 13.8 10*3/uL — ABNORMAL HIGH (ref 4.0–10.5)
nRBC: 0 % (ref 0.0–0.2)

## 2023-02-04 LAB — I-STAT VENOUS BLOOD GAS, ED
Acid-Base Excess: 11 mmol/L — ABNORMAL HIGH (ref 0.0–2.0)
Bicarbonate: 37.9 mmol/L — ABNORMAL HIGH (ref 20.0–28.0)
Calcium, Ion: 1.2 mmol/L (ref 1.15–1.40)
HCT: 37 % — ABNORMAL LOW (ref 39.0–52.0)
Hemoglobin: 12.6 g/dL — ABNORMAL LOW (ref 13.0–17.0)
O2 Saturation: 61 %
Patient temperature: 98.5
Potassium: 4.4 mmol/L (ref 3.5–5.1)
Sodium: 136 mmol/L (ref 135–145)
TCO2: 40 mmol/L — ABNORMAL HIGH (ref 22–32)
pCO2, Ven: 63 mmHg — ABNORMAL HIGH (ref 44–60)
pH, Ven: 7.388 (ref 7.25–7.43)
pO2, Ven: 33 mmHg (ref 32–45)

## 2023-02-04 LAB — COMPREHENSIVE METABOLIC PANEL
ALT: 42 U/L (ref 0–44)
AST: 32 U/L (ref 15–41)
Albumin: 3.6 g/dL (ref 3.5–5.0)
Alkaline Phosphatase: 69 U/L (ref 38–126)
Anion gap: 10 (ref 5–15)
BUN: 14 mg/dL (ref 8–23)
CO2: 35 mmol/L — ABNORMAL HIGH (ref 22–32)
Calcium: 9.1 mg/dL (ref 8.9–10.3)
Chloride: 92 mmol/L — ABNORMAL LOW (ref 98–111)
Creatinine, Ser: 0.76 mg/dL (ref 0.61–1.24)
GFR, Estimated: >60 mL/min (ref >60)
Glucose, Bld: 152 mg/dL — ABNORMAL HIGH (ref 70–99)
Potassium: 4.1 mmol/L (ref 3.5–5.1)
Sodium: 137 mmol/L (ref 135–145)
Total Bilirubin: 0.6 mg/dL (ref 0.3–1.2)
Total Protein: 6.6 g/dL (ref 6.5–8.1)

## 2023-02-04 LAB — RESP PANEL BY RT-PCR (RSV, FLU A&B, COVID)  RVPGX2
Influenza A by PCR: NEGATIVE
Influenza B by PCR: NEGATIVE
Resp Syncytial Virus by PCR: NEGATIVE
SARS Coronavirus 2 by RT PCR: NEGATIVE

## 2023-02-04 LAB — TROPONIN I (HIGH SENSITIVITY)
Troponin I (High Sensitivity): 9 ng/L (ref <18)
Troponin I (High Sensitivity): 9 ng/L (ref <18)

## 2023-02-04 LAB — HIV ANTIBODY (ROUTINE TESTING W REFLEX): HIV Screen 4th Generation wRfx: NONREACTIVE

## 2023-02-04 LAB — GLUCOSE, CAPILLARY: Glucose-Capillary: 201 mg/dL — ABNORMAL HIGH (ref 70–99)

## 2023-02-04 LAB — BRAIN NATRIURETIC PEPTIDE: B Natriuretic Peptide: 97.3 pg/mL (ref 0.0–100.0)

## 2023-02-04 MED ORDER — FUROSEMIDE 10 MG/ML IJ SOLN
40.0000 mg | Freq: Once | INTRAMUSCULAR | Status: AC
  Administered 2023-02-04: 40 mg via INTRAVENOUS
  Filled 2023-02-04: qty 4

## 2023-02-04 MED ORDER — ALPRAZOLAM 0.5 MG PO TABS: 0.5000 mg | ORAL_TABLET | Freq: Two times a day (BID) | ORAL | Status: DC | PRN

## 2023-02-04 MED ORDER — METHYLPREDNISOLONE SODIUM SUCC 40 MG IJ SOLR
40.0000 mg | Freq: Two times a day (BID) | INTRAMUSCULAR | Status: AC
  Administered 2023-02-04 – 2023-02-05 (×2): 40 mg via INTRAVENOUS
  Filled 2023-02-04 (×2): qty 1

## 2023-02-04 MED ORDER — SODIUM CHLORIDE 0.9 % IV SOLN: 250.0000 mL | INTRAVENOUS | Status: DC | PRN

## 2023-02-04 MED ORDER — PROBIOTIC DAILY PO CAPS: 1.0000 | ORAL_CAPSULE | Freq: Every morning | ORAL | Status: DC

## 2023-02-04 MED ORDER — IOHEXOL 350 MG/ML SOLN
100.0000 mL | Freq: Once | INTRAVENOUS | Status: AC | PRN
  Administered 2023-02-04: 75 mL via INTRAVENOUS

## 2023-02-04 MED ORDER — GUAIFENESIN ER 600 MG PO TB12
600.0000 mg | ORAL_TABLET | Freq: Two times a day (BID) | ORAL | Status: DC
  Administered 2023-02-04 – 2023-02-07 (×6): 600 mg via ORAL
  Filled 2023-02-04 (×6): qty 1

## 2023-02-04 MED ORDER — IPRATROPIUM-ALBUTEROL 0.5-2.5 (3) MG/3ML IN SOLN
3.0000 mL | Freq: Once | RESPIRATORY_TRACT | Status: AC
  Administered 2023-02-04: 3 mL via RESPIRATORY_TRACT
  Filled 2023-02-04: qty 3

## 2023-02-04 MED ORDER — CARVEDILOL 3.125 MG PO TABS
3.1250 mg | ORAL_TABLET | Freq: Two times a day (BID) | ORAL | Status: DC
  Administered 2023-02-04 – 2023-02-06 (×4): 3.125 mg via ORAL
  Filled 2023-02-04 (×3): qty 1

## 2023-02-04 MED ORDER — DIGOXIN 0.25 MG/ML IJ SOLN
0.1250 mg | Freq: Four times a day (QID) | INTRAMUSCULAR | Status: AC
  Administered 2023-02-04 – 2023-02-05 (×2): 0.125 mg via INTRAVENOUS
  Filled 2023-02-04 (×2): qty 0.5

## 2023-02-04 MED ORDER — PANTOPRAZOLE SODIUM 40 MG PO TBEC
40.0000 mg | DELAYED_RELEASE_TABLET | Freq: Two times a day (BID) | ORAL | Status: DC
  Administered 2023-02-05 – 2023-02-07 (×5): 40 mg via ORAL
  Filled 2023-02-04 (×5): qty 1

## 2023-02-04 MED ORDER — DOXYCYCLINE HYCLATE 100 MG PO TABS
100.0000 mg | ORAL_TABLET | Freq: Two times a day (BID) | ORAL | Status: DC
  Administered 2023-02-04 – 2023-02-07 (×6): 100 mg via ORAL
  Filled 2023-02-04 (×6): qty 1

## 2023-02-04 MED ORDER — FLUTICASONE FUROATE-VILANTEROL 200-25 MCG/ACT IN AEPB
1.0000 | INHALATION_SPRAY | Freq: Every day | RESPIRATORY_TRACT | Status: DC
  Administered 2023-02-05 – 2023-02-07 (×3): 1 via RESPIRATORY_TRACT
  Filled 2023-02-04: qty 28

## 2023-02-04 MED ORDER — ACETAMINOPHEN 325 MG PO TABS: 650.0000 mg | ORAL_TABLET | ORAL | Status: DC | PRN

## 2023-02-04 MED ORDER — ATORVASTATIN CALCIUM 40 MG PO TABS
40.0000 mg | ORAL_TABLET | Freq: Every day | ORAL | Status: DC
  Administered 2023-02-04 – 2023-02-06 (×3): 40 mg via ORAL
  Filled 2023-02-04 (×3): qty 1

## 2023-02-04 MED ORDER — ENOXAPARIN SODIUM 40 MG/0.4ML IJ SOSY
40.0000 mg | PREFILLED_SYRINGE | INTRAMUSCULAR | Status: DC
  Administered 2023-02-04 – 2023-02-06 (×3): 40 mg via SUBCUTANEOUS
  Filled 2023-02-04 (×3): qty 0.4

## 2023-02-04 MED ORDER — PREDNISONE 20 MG PO TABS
40.0000 mg | ORAL_TABLET | Freq: Every day | ORAL | Status: DC
  Administered 2023-02-05 – 2023-02-07 (×3): 40 mg via ORAL
  Filled 2023-02-04 (×3): qty 2

## 2023-02-04 MED ORDER — BUDESON-GLYCOPYRROL-FORMOTEROL 160-9-4.8 MCG/ACT IN AERO: 2.0000 | INHALATION_SPRAY | Freq: Two times a day (BID) | RESPIRATORY_TRACT | Status: DC

## 2023-02-04 MED ORDER — SODIUM CHLORIDE 0.9% FLUSH
3.0000 mL | Freq: Two times a day (BID) | INTRAVENOUS | Status: DC
  Administered 2023-02-04 – 2023-02-07 (×6): 3 mL via INTRAVENOUS

## 2023-02-04 MED ORDER — METHYLPREDNISOLONE SODIUM SUCC 125 MG IJ SOLR
125.0000 mg | Freq: Once | INTRAMUSCULAR | Status: AC
  Administered 2023-02-04: 125 mg via INTRAVENOUS
  Filled 2023-02-04: qty 2

## 2023-02-04 MED ORDER — SODIUM CHLORIDE 0.9% FLUSH
3.0000 mL | INTRAVENOUS | Status: DC | PRN
  Administered 2023-02-04 – 2023-02-06 (×4): 3 mL via INTRAVENOUS

## 2023-02-04 MED ORDER — MELATONIN 5 MG PO TABS
12.0000 mg | ORAL_TABLET | Freq: Every day | ORAL | Status: DC
  Administered 2023-02-04 – 2023-02-06 (×3): 12.5 mg via ORAL
  Filled 2023-02-04 (×3): qty 3

## 2023-02-04 MED ORDER — ASPIRIN 81 MG PO TBEC
81.0000 mg | DELAYED_RELEASE_TABLET | Freq: Every day | ORAL | Status: DC
  Administered 2023-02-05 – 2023-02-07 (×3): 81 mg via ORAL
  Filled 2023-02-04 (×3): qty 1

## 2023-02-04 MED ORDER — SERTRALINE HCL 50 MG PO TABS
50.0000 mg | ORAL_TABLET | Freq: Every day | ORAL | Status: DC
  Administered 2023-02-04 – 2023-02-06 (×3): 50 mg via ORAL
  Filled 2023-02-04 (×3): qty 1

## 2023-02-04 MED ORDER — ALBUTEROL SULFATE HFA 108 (90 BASE) MCG/ACT IN AERS: 2.0000 | INHALATION_SPRAY | RESPIRATORY_TRACT | Status: DC | PRN

## 2023-02-04 MED ORDER — ACETAMINOPHEN 500 MG PO TABS: 1000.0000 mg | ORAL_TABLET | Freq: Every evening | ORAL | Status: DC | PRN

## 2023-02-04 MED ORDER — FLUTICASONE PROPIONATE 50 MCG/ACT NA SUSP: 2.0000 | Freq: Every day | NASAL | Status: DC | PRN

## 2023-02-04 MED ORDER — ALBUTEROL SULFATE (2.5 MG/3ML) 0.083% IN NEBU: 2.5000 mg | INHALATION_SOLUTION | Freq: Four times a day (QID) | RESPIRATORY_TRACT | Status: DC | PRN

## 2023-02-04 MED ORDER — ONDANSETRON HCL 4 MG/2ML IJ SOLN: 4.0000 mg | Freq: Four times a day (QID) | INTRAMUSCULAR | Status: DC | PRN

## 2023-02-04 MED ORDER — UMECLIDINIUM BROMIDE 62.5 MCG/ACT IN AEPB
1.0000 | INHALATION_SPRAY | Freq: Every day | RESPIRATORY_TRACT | Status: DC
  Administered 2023-02-05 – 2023-02-07 (×3): 1 via RESPIRATORY_TRACT
  Filled 2023-02-04: qty 7

## 2023-02-04 MED ORDER — LORATADINE 10 MG PO TABS
10.0000 mg | ORAL_TABLET | Freq: Every morning | ORAL | Status: DC
  Administered 2023-02-05 – 2023-02-07 (×3): 10 mg via ORAL
  Filled 2023-02-04 (×3): qty 1

## 2023-02-04 MED ORDER — INSULIN ASPART 100 UNIT/ML IJ SOLN
0.0000 [IU] | Freq: Three times a day (TID) | INTRAMUSCULAR | Status: DC
  Administered 2023-02-04: 5 [IU] via SUBCUTANEOUS
  Administered 2023-02-05: 2 [IU] via SUBCUTANEOUS
  Administered 2023-02-05: 3 [IU] via SUBCUTANEOUS
  Administered 2023-02-06: 2 [IU] via SUBCUTANEOUS
  Administered 2023-02-06: 3 [IU] via SUBCUTANEOUS
  Administered 2023-02-07: 2 [IU] via SUBCUTANEOUS

## 2023-02-04 MED ORDER — FERROUS SULFATE 325 (65 FE) MG PO TABS
325.0000 mg | ORAL_TABLET | Freq: Two times a day (BID) | ORAL | Status: DC
  Administered 2023-02-05 – 2023-02-07 (×4): 325 mg via ORAL
  Filled 2023-02-04 (×4): qty 1

## 2023-02-04 NOTE — ED Notes (Signed)
Began not feeling well this past Sunday, bilateral ankle swelling is clearly noted on exam. Appears to be short of breath while lying stretcher and worsens with moving in bed and talking. HR currently at 105/min, no ectopy currently noted. RT placed client on home oxygen settings of 2lpm via [place]. ED MD at bedside.

## 2023-02-04 NOTE — ED Provider Notes (Signed)
[facility] Provider Note   CSN: [id number] Arrival date & time: 02/04/23  [id number]     History  Chief Complaint  Patient presents with   Shortness of Breath   Chest Pain    RENDER Paul [name] is a 77 y.o. male.  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  Presents with shortness of breath worse with exertion and intermittent chest pain for 2 days.  Wife reports that his oxygen level dropped to 82% while he was on oxygen getting dressed this morning.  He has had bilateral lower extremity edema.  Nonradiating chest pain which is worse with exertion.  Reports it feels like a pressure, present with ambulation.   Had been doing well when he left the hospital, with wife reporting he had normal heart rate, normal oxygen saturations, was was feeling good, however since Sunday he has progressively worsened.  Has developed significant lower extremity swelling.  Reports orthopnea.  Reports he had a continuous cough which is largely unchanged from prior. Feels better after breathing treatmnet temporarily but dyspnea returns.   Past Medical History:  Diagnosis Date   Anemia    Aortic atherosclerosis ([place])    Aspiration pneumonia due to inhalation of vomitus ([place]) 02/03/2022   Atrial fibrillation (HCC)    Back pain    COPD (chronic obstructive pulmonary disease) (HCC)    COPD exacerbation ([place]) 12/27/2021   COVID-19 12/24/2020   Diabetes mellitus without complication (HCC)    Dizziness 04/08/2022   Elevated PSA    GAD (generalized anxiety disorder)    GERD (gastroesophageal reflux disease)    High cholesterol    Hypertension    Lingular pneumonia 01/05/2021   See cxr 01/06/20 rx  with zpak/ cefipime x 5 days and then developed purulent sputum again 01/12/21 so rx levcaquin 500 mg daily x 7 days then return for cxr before more    Lung nodule < 6cm on CT 05/29/2016   Osteoporosis    Oxygen deficiency    Pneumonia    January 2022   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 Quentin Ore with a 20 mm Watchman FLX device   RSV bronchitis 01/27/2022   Vertebral compression fracture ([place]) 05/29/2019   T8 compression fracture noted on CT scan 05/28/2019 inpatient with chronic steroid dependent COPD - rx calcitonin nasal spray rx per PCP      Home Medications Prior to Admission medications   Medication Sig Start Date End Date Taking? Authorizing Provider  acetaminophen (TYLENOL) 500 MG tablet Take 1,000 mg by mouth at bedtime as needed (pain).    [provider]  albuterol (PROAIR HFA) 108 (90 Base) MCG/ACT inhaler Inhale 2 puffs into the lungs as needed for wheezing or shortness of breath.    [provider]  albuterol (PROVENTIL) (2.5 MG/3ML) 0.083% nebulizer solution Take 3 mLs (2.5 mg total) by nebulization every 6 (six) hours as needed for shortness of breath or wheezing. 10/23/22   Parrett, Fonnie Mu, NP  ALPRAZolam (XANAX) 0.5 MG tablet Take 1 tablet (0.5 mg total) by mouth 2 (two) times daily as needed for anxiety. 07/25/22   [name], MD  Ascorbic Acid (VITAMIN C)  1000 MG tablet Take 1,000 mg by mouth daily.    [provider]  aspirin EC 81 MG tablet Take 1 tablet (81 mg total) by mouth daily. Swallow whole. 01/28/22   Tommie Raymond, NP  atorvastatin (LIPITOR) 40 MG tablet TAKE 1 TABLET BY MOUTH EVERYDAY AT BEDTIME Patient taking differently: Take 40 mg by mouth at bedtime. 11/26/22   Marge Duncans, PA-C  Budeson-Glycopyrrol-Formoterol (BREZTRI AEROSPHERE) 160-9-4.8 MCG/ACT AERO Inhale 2 puffs into the lungs 2 (two) times daily. 01/02/23   Cox, Elnita Maxwell, MD  calcium carbonate (OS-CAL) 600 MG TABS tablet Take  600 mg by mouth daily.    [provider]  Cholecalciferol (VITAMIN D3) 50 MCG (2000 UT) TABS Take 2,000 Units by mouth in the morning.    [provider]  denosumab (PROLIA) 60 MG/ML SOSY injection Inject 60 mg into the skin every 6 (six) months.    [provider]  ferrous sulfate 325 (65 FE) MG tablet Take 325 mg by mouth 2 (two) times daily with a meal.    [provider]  fluticasone (FLONASE) 50 MCG/ACT nasal spray Place 2 sprays into both nostrils daily as needed for allergies or rhinitis.    [provider]  guaiFENesin (MUCINEX) 600 MG 12 hr tablet Take 1 tablet (600 mg total) by mouth 2 (two) times daily for 7 days. 01/28/23 02/04/23  Antonieta Pert, MD  loratadine (CLARITIN) 10 MG tablet Take 10 mg by mouth in the morning.    [provider]  Melatonin 12 MG TABS Take 12 mg by mouth at bedtime.    [provider]  Multiple Vitamin (MULTIVITAMIN WITH MINERALS) TABS tablet Take 1 tablet by mouth daily with breakfast.    [provider]  OXYGEN Inhale 2 L/min into the lungs at bedtime.    [provider]  pantoprazole (PROTONIX) 40 MG tablet Take 1 tablet (40 mg total) by mouth 2 (two) times daily. 11/06/22   Tanda Rockers, MD  predniSONE (DELTASONE) 10 MG tablet Take PO 4 tabs daily x 2 days,3 tabs daily x 2 days,2 tabs daily x 2 days,1 tab daily as hoem dose they way you were doing 01/28/23   Antonieta Pert, MD  Probiotic Product (PROBIOTIC DAILY) CAPS Take 1 capsule by mouth in the morning.    [provider]  sertraline (ZOLOFT) 50 MG tablet Take 1 tablet (50 mg total) by mouth at bedtime. 09/16/22   CoxElnita Maxwell, MD      Allergies    Tape, Daliresp [roflumilast], Levaquin [levofloxacin], and Alendronate    Review of Systems   Review of Systems  Physical Exam Updated Vital Signs BP (!) 167/79 (BP Location: Left Arm)   Pulse 100   Temp 97.8 F (36.6 C)   Resp 18   Ht 5' 6"$  (1.676 m)   Wt 71.4 kg    SpO2 99%   BMI 25.42 kg/m  Physical Exam Vitals and nursing note reviewed.  Constitutional:      General: He is not in acute distress.    Appearance: He is well-developed. He is not diaphoretic.  HENT:     Head: Normocephalic and atraumatic.  Eyes:     Conjunctiva/sclera: Conjunctivae normal.  Cardiovascular:     Rate and Rhythm: Normal rate and regular rhythm.     Heart sounds: Normal heart sounds. No murmur heard.    No friction rub. No gallop.  Pulmonary:     Effort: Pulmonary effort is normal. No respiratory distress.  Breath sounds: Decreased breath sounds and wheezing present. No rales.  Abdominal:     General: There is no distension.     Palpations: Abdomen is soft.     Tenderness: There is no abdominal tenderness. There is no guarding.  Musculoskeletal:     Cervical back: Normal range of motion.     Right lower leg: Edema present.     Left lower leg: Edema present.  Skin:    General: Skin is warm and dry.  Neurological:     Mental Status: He is alert and oriented to person, place, and time.     ED Results / Procedures / Treatments   Labs (all labs ordered are listed, but only abnormal results are displayed) Labs Reviewed  CBC WITH DIFFERENTIAL/PLATELET - Abnormal; Notable for the following components:      Result Value   WBC 13.8 (*)    Hemoglobin 12.4 (*)    MCH 25.7 (*)    Neutro Abs 12.4 (*)    Lymphs Abs 0.6 (*)    Abs Immature Granulocytes 0.11 (*)    All other components within normal limits  COMPREHENSIVE METABOLIC PANEL - Abnormal; Notable for the following components:   Chloride 92 (*)    CO2 35 (*)    Glucose, Bld 152 (*)    All other components within normal limits  GLUCOSE, CAPILLARY - Abnormal; Notable for the following components:   Glucose-Capillary 201 (*)    All other components within normal limits  I-STAT VENOUS BLOOD GAS, ED - Abnormal; Notable for the following components:   pCO2, Ven 63.0 (*)    Bicarbonate 37.9 (*)    TCO2 40  (*)    Acid-Base Excess 11.0 (*)    HCT 37.0 (*)    Hemoglobin 12.6 (*)    All other components within normal limits  RESP PANEL BY RT-PCR (RSV, FLU A&B, COVID)  RVPGX2  BRAIN NATRIURETIC PEPTIDE  HIV ANTIBODY (ROUTINE TESTING W REFLEX)  BLOOD GAS, VENOUS  BASIC METABOLIC PANEL  LEGIONELLA PNEUMOPHILA SEROGP 1 UR AG  MYCOPLASMA PNEUMONIAE ANTIBODY, IGM  TROPONIN I (HIGH SENSITIVITY)  TROPONIN I (HIGH SENSITIVITY)    EKG EKG Interpretation  Date/Time:  Tuesday February 04 2023 09:31:50 EST Ventricular Rate:  109 PR Interval:  115 QRS Duration: 81 QT Interval:  305 QTC Calculation: 411 R Axis:   76 Text Interpretation: Sinus tachycardia  Rhythm appears to be in sinus compared to previous Confirmed by Gareth Morgan [phone]) on 02/04/2023 9:55:08 AM  Radiology CT Angio Chest PE W and/or Wo Contrast  Result Date: 02/04/2023 CLINICAL DATA:  Shortness of breath x2 days, bilateral lower extremity swelling, recent hospitalization for AFib, evaluate for PE EXAM: CT ANGIOGRAPHY CHEST WITH CONTRAST TECHNIQUE: Multidetector CT imaging of the chest was performed using the standard protocol during bolus administration of intravenous contrast. Multiplanar CT image reconstructions and MIPs were obtained to evaluate the vascular anatomy. RADIATION DOSE REDUCTION: This exam was performed according to the departmental dose-optimization program which includes automated exposure control, adjustment of the mA and/or kV according to patient size and/or use of iterative reconstruction technique. CONTRAST:  40m OMNIPAQUE IOHEXOL 350 MG/ML SOLN COMPARISON:  CTA chest dated 01/25/2023 FINDINGS: Cardiovascular: Satisfactory opacification the bilateral pulmonary arteries to the segmental level. No evidence of pulmonary embolism. Although not tailored for evaluation of the thoracic aorta, there is no evidence thoracic aortic aneurysm or dissection. Atherosclerotic calcifications of the aortic arch. Moderate  three-vessel coronary atherosclerosis. Mediastinum/Nodes: No suspicious mediastinal lymphadenopathy. Visualized  thyroid is unremarkable. Lungs/Pleura: Biapical pleural-parenchymal scarring. Moderate centrilobular and paraseptal emphysematous changes, upper lung predominant. No suspicious pulmonary nodules. No focal consolidation. No pleural effusion or pneumothorax. Upper Abdomen: Visualized upper abdomen is grossly unremarkable, noting vascular calcifications. Musculoskeletal: Moderate to severe compression fracture deformities at T4, T5, T7, and T8, chronic. Prior vertebral augmentation at L1. Review of the MIP images confirms the above findings. IMPRESSION: No evidence of pulmonary embolism. No evidence of acute cardiopulmonary disease. Aortic Atherosclerosis (ICD10-I70.0) and Emphysema (ICD10-J43.9). Electronically Signed   By: Julian Hy M.D.   On: 02/04/2023 11:48   DG Chest 2 View  Result Date: 02/04/2023 CLINICAL DATA:  cp EXAM: CHEST - 2 VIEW COMPARISON:  01/25/2023. FINDINGS: The heart size and mediastinal contours are within normal limits. Both lungs are clear. No pneumothorax or pleural effusion. Aorta is calcified. There are thoracic degenerative changes. IMPRESSION: No active cardiopulmonary disease. Electronically Signed   By: Sammie Bench M.D.   On: 02/04/2023 10:48    Procedures Procedures    Medications Ordered in ED Medications  acetaminophen (TYLENOL) tablet 1,000 mg (has no administration in time range)  aspirin EC tablet 81 mg (has no administration in time range)  atorvastatin (LIPITOR) tablet 40 mg (40 mg Oral Given 02/04/23 2152)  ALPRAZolam (XANAX) tablet 0.5 mg (has no administration in time range)  sertraline (ZOLOFT) tablet 50 mg (50 mg Oral Given 02/04/23 2152)  pantoprazole (PROTONIX) EC tablet 40 mg (has no administration in time range)  ferrous sulfate tablet 325 mg (has no administration in time range)  melatonin tablet 12.5 mg (12.5 mg Oral Given 02/04/23  2152)  albuterol (PROVENTIL) (2.5 MG/3ML) 0.083% nebulizer solution 2.5 mg (has no administration in time range)  fluticasone (FLONASE) 50 MCG/ACT nasal spray 2 spray (has no administration in time range)  guaiFENesin (MUCINEX) 12 hr tablet 600 mg (600 mg Oral Given 02/04/23 2152)  loratadine (CLARITIN) tablet 10 mg (has no administration in time range)  fluticasone furoate-vilanterol (BREO ELLIPTA) 200-25 MCG/ACT 1 puff (has no administration in time range)    And  umeclidinium bromide (INCRUSE ELLIPTA) 62.5 MCG/ACT 1 puff (has no administration in time range)  digoxin (LANOXIN) 0.25 MG/ML injection 0.125 mg (0.125 mg Intravenous Given 02/04/23 1858)  carvedilol (COREG) tablet 3.125 mg (3.125 mg Oral Given 02/04/23 1853)  sodium chloride flush (NS) 0.9 % injection 3 mL (3 mLs Intravenous Given 02/04/23 2155)  sodium chloride flush (NS) 0.9 % injection 3 mL (3 mLs Intravenous Given 02/04/23 2154)  0.9 %  sodium chloride infusion (has no administration in time range)  acetaminophen (TYLENOL) tablet 650 mg (has no administration in time range)  ondansetron (ZOFRAN) injection 4 mg (has no administration in time range)  enoxaparin (LOVENOX) injection 40 mg (40 mg Subcutaneous Given 02/04/23 2153)  methylPREDNISolone sodium succinate (SOLU-MEDROL) 40 mg/mL injection 40 mg (40 mg Intravenous Given 02/04/23 1857)    Followed by  predniSONE (DELTASONE) tablet 40 mg (has no administration in time range)  doxycycline (VIBRA-TABS) tablet 100 mg (100 mg Oral Given 02/04/23 1853)  insulin aspart (novoLOG) injection 0-15 Units (5 Units Subcutaneous Given 02/04/23 2155)  methylPREDNISolone sodium succinate (SOLU-MEDROL) 125 mg/2 mL injection 125 mg (125 mg Intravenous Given 02/04/23 1112)  ipratropium-albuterol (DUONEB) 0.5-2.5 (3) MG/3ML nebulizer solution 3 mL (3 mLs Nebulization Given 02/04/23 1113)  iohexol (OMNIPAQUE) 350 MG/ML injection 100 mL (75 mLs Intravenous Contrast Given 02/04/23 1128)   ipratropium-albuterol (DUONEB) 0.5-2.5 (3) MG/3ML nebulizer solution 3 mL (3 mLs Nebulization Given 02/04/23 1238)  furosemide (LASIX) injection 40 mg (40 mg Intravenous Given 02/04/23 1319)    ED Course/ Medical Decision Making/ A&P                               77yo male with history of COPD on 2L of oxygen chronically, atrial fibrillation not on anticoagulant status post watchman, type 2 diabetes, CHF diastolic, hyperlipidemia, anemia recent admission February 3 to 6 with acute COPD exacerbation, atrial fibrillation with RVR who presents with shortness of breath.  Differential diagnosis for dyspnea includes ACS, PE, COPD exacerbation, CHF exacerbation, anemia, pneumonia, viral etiology such as COVID 19 infection, metabolic abnormality.    Chest x-ray was done and personally evaluated by me which showed no acute abnormality, no edema, no pneumonia.   EKG was evaluated by me which showed sinus tachycardia in comparison to prior atrial fibrillation during hospitalization.  BNP was WNL.Troponin WNL.  CT PE study without acute PE or signs of occult pneumonia or edema.  Does have significant edema bilaterally, given lasix.  Given solumedrol, duonebs.    Has significant exertional chest pressure as well as continued dyspnea. Will admit for continued treatment of COPD and cardiac evaluation.            Final Clinical Impression(s) / ED Diagnoses Final diagnoses:  Exertional chest pain  Dyspnea, unspecified type  Lower extremity edema  COPD exacerbation ([place])    Rx / DC Orders ED Discharge Orders     None         Gareth Morgan, MD 02/04/23 2313

## 2023-02-04 NOTE — ED Notes (Signed)
.  ed

## 2023-02-04 NOTE — H&P (Signed)
History and Physical    Paul [name] [id number] DOB: 03/05/1946 DOA: 02/04/2023  PCP: [name], MD (Confirm with patient/family/NH records and if not entered, this has to be entered at [facility] point of entry) Patient coming from: Home  I have personally briefly reviewed patient's old medical records in [place]  Chief Complaint: Wheezing, cough, shortness of breath, leg swelling  HPI: Paul [name] is a 77 y.o. 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  Patient was recently hospitalized for similar presentation, and on discharge, patient was found tapering dose of steroid which patient is still taking, and during last admission, beta-blocker was discontinued due to concern about bradycardia.  Patient went home, and initially he had continued improvement of about 4 to 5 days then last 3 to 4 days he started to have dry cough wheezing and increasing leg swelling again and face sports watch kept telling him his heart rate is high, and sometimes can as high as 160s however unfortunately his Sportwatch does not catch rhythm.  Next week, patient is scheduled for ablation procedure with cardiology.  ED Course: Patient was found to be in and out of A-fib with increased blood pressure.  And chest x-ray showed some lung congestion.  WBC 13.8, creatinine 0.2, VBG 7 point 3/63/33  Patient was given IV Lasix x 1.  Review of Systems: As per HPI otherwise 14 point review of systems negative.    Past Medical History:  Diagnosis Date   Anemia    Aortic atherosclerosis (HCC)    Aspiration pneumonia due to inhalation of vomitus ([place]) 02/03/2022   Atrial fibrillation (HCC)    Back pain    COPD (chronic obstructive pulmonary disease) (HCC)    COPD exacerbation ([place]) 12/27/2021    COVID-19 12/24/2020   Diabetes mellitus without complication (HCC)    Dizziness 04/08/2022   Elevated PSA    GAD (generalized anxiety disorder)    GERD (gastroesophageal reflux disease)    High cholesterol    Hypertension    Lingular pneumonia 01/05/2021   See cxr 01/06/20 rx with zpak/ cefipime x 5 days and then developed purulent sputum again 01/12/21 so rx levcaquin 500 mg daily x 7 days then return for cxr before more    Lung nodule < 6cm on CT 05/29/2016   Osteoporosis    Oxygen deficiency    Pneumonia    January 2022   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 Quentin Ore with a 20 mm Watchman FLX device   RSV bronchitis 01/27/2022   Vertebral compression fracture ([place]) 05/29/2019   T8 compression fracture noted on CT scan 05/28/2019 inpatient with chronic steroid dependent COPD - rx calcitonin nasal spray rx per PCP     Past Surgical History:  Procedure Laterality Date   CATARACT EXTRACTION Right    ELBOW SURGERY  07/2022   surgery on olecranon bursa. Dr. Greta Doom   IR KYPHO EA ADDL LEVEL THORACIC OR LUMBAR  11/28/2021   LEFT ATRIAL APPENDAGE OCCLUSION N/A 08/02/2021   Procedure: LEFT ATRIAL APPENDAGE OCCLUSION;  Surgeon: Sherren Mocha, MD;  Location: [facility];  Service: Cardiovascular;  Laterality: N/A;   SKIN SURGERY     shoulders and chest   TEE WITHOUT CARDIOVERSION N/A 08/02/2021   Procedure: TRANSESOPHAGEAL  ECHOCARDIOGRAM (TEE);  Surgeon: Sherren Mocha, MD;  Location: [facility];  Service: Cardiovascular;  Laterality: N/A;   TEE WITHOUT CARDIOVERSION N/A 09/13/2021   Procedure: TRANSESOPHAGEAL ECHOCARDIOGRAM (TEE);  Surgeon: Sanda Klein, MD;  Location: [facility]OS[facility];  Service: Cardiovascular;  Laterality: N/A;     reports that he quit smoking about 14 years ago. His smoking use included cigarettes. He has a 104.00 pack-year smoking history. He has never used smokeless tobacco. He reports that he does not  currently use alcohol. He reports that he does not use drugs.  Allergies  Allergen Reactions   Tape Other (See Comments)    PLEASE USE COBAN WRAP IN LIEU OF "TAPE," as it "pulls off the skin!!   Daliresp [Roflumilast] Diarrhea and Nausea And Vomiting   Levaquin [Levofloxacin] Palpitations and Other (See Comments)    Made the B/P fluctuate and heart raced   Alendronate Anxiety and Other (See Comments)    Jittery/nervous    Family History  Problem Relation Age of Onset   Heart disease Brother    Heart disease Mother    Heart disease Father    Cancer Paternal Uncle      Prior to Admission medications   Medication Sig Start Date End Date Taking? Authorizing Provider  acetaminophen (TYLENOL) 500 MG tablet Take 1,000 mg by mouth at bedtime as needed (pain).    [provider]  albuterol (PROAIR HFA) 108 (90 Base) MCG/ACT inhaler Inhale 2 puffs into the lungs as needed for wheezing or shortness of breath.    [provider]  albuterol (PROVENTIL) (2.5 MG/3ML) 0.083% nebulizer solution Take 3 mLs (2.5 mg total) by nebulization every 6 (six) hours as needed for shortness of breath or wheezing. 10/23/22   Parrett, Fonnie Mu, NP  ALPRAZolam (XANAX) 0.5 MG tablet Take 1 tablet (0.5 mg total) by mouth 2 (two) times daily as needed for anxiety. 07/25/22   CoxElnita Maxwell, MD  Ascorbic Acid (VITAMIN C) 1000 MG tablet Take 1,000 mg by mouth daily.    [provider]  aspirin EC 81 MG tablet Take 1 tablet (81 mg total) by mouth daily. Swallow whole. 01/28/22   Tommie Raymond, NP  atorvastatin (LIPITOR) 40 MG tablet TAKE 1 TABLET BY MOUTH EVERYDAY AT BEDTIME Patient taking differently: Take 40 mg by mouth at bedtime. 11/26/22   Marge Duncans, PA-C  Budeson-Glycopyrrol-Formoterol (BREZTRI AEROSPHERE) 160-9-4.8 MCG/ACT AERO Inhale 2 puffs into the lungs 2 (two) times daily. 01/02/23   Cox, Elnita Maxwell, MD  calcium carbonate (OS-CAL) 600 MG TABS tablet Take 600 mg by mouth daily.    [provider]  Cholecalciferol (VITAMIN D3) 50 MCG (2000 UT) TABS Take 2,000 Units by mouth in the morning.    [provider]  denosumab (PROLIA) 60 MG/ML SOSY injection Inject 60 mg into the skin every 6 (six) months.    [provider]  ferrous sulfate 325 (65 FE) MG tablet Take 325 mg by mouth 2 (two) times daily with a meal.    [provider]  fluticasone (FLONASE) 50 MCG/ACT nasal spray Place 2 sprays into both nostrils daily as needed for allergies or rhinitis.    [provider]  guaiFENesin (MUCINEX) 600 MG 12 hr tablet Take 1 tablet (600 mg total) by mouth 2 (two) times daily for 7 days. 01/28/23 02/04/23  Antonieta Pert, MD  loratadine (CLARITIN) 10 MG tablet Take 10 mg by mouth in the morning.    [provider]  Melatonin 12  MG TABS Take 12 mg by mouth at bedtime.    [provider]  Multiple Vitamin (MULTIVITAMIN WITH MINERALS) TABS tablet Take 1 tablet by mouth daily with breakfast.    [provider]  OXYGEN Inhale 2 L/min into the lungs at bedtime.    [provider]  pantoprazole (PROTONIX) 40 MG tablet Take 1 tablet (40 mg total) by mouth 2 (two) times daily. 11/06/22   Tanda Rockers, MD  predniSONE (DELTASONE) 10 MG tablet Take PO 4 tabs daily x 2 days,3 tabs daily x 2 days,2 tabs daily x 2 days,1 tab daily as hoem dose they way you were doing 01/28/23   Antonieta Pert, MD  Probiotic Product (PROBIOTIC DAILY) CAPS Take 1 capsule by mouth in the morning.    [provider]  sertraline (ZOLOFT) 50 MG tablet Take 1 tablet (50 mg total) by mouth at bedtime. 09/16/22   [name], MD    Physical Exam: Vitals:   02/04/23 1530 02/04/23 1600 02/04/23 1630 02/04/23 1750  BP: (!) 176/73 (!) 164/77 (!) 146/104 (!) 140/64  Pulse: (!) 111 (!) 110 (!) 113 (!) 110  Resp: (!) 28 20 (!) 26 20  Temp:    98.6 F (37 C)  TempSrc:    Oral  SpO2: 94% 97% 100% 98%  Weight:    71.4 kg  Height:    5' 6"$  (1.676 m)     Constitutional: NAD, calm, comfortable Vitals:   02/04/23 1530 02/04/23 1600 02/04/23 1630 02/04/23 1750  BP: (!) 176/73 (!) 164/77 (!) 146/104 (!) 140/64  Pulse: (!) 111 (!) 110 (!) 113 (!) 110  Resp: (!) 28 20 (!) 26 20  Temp:    98.6 F (37 C)  TempSrc:    Oral  SpO2: 94% 97% 100% 98%  Weight:    71.4 kg  Height:    5' 6"$  (1.676 m)   Eyes: PERRL, lids and conjunctivae normal ENMT: Mucous membranes are moist. Posterior pharynx clear of any exudate or lesions.Normal dentition.  Neck: normal, supple, no masses, no thyromegaly Respiratory: clear to auscultation bilaterally, scattered wheezing with bilateral lower field crackles, increasing breathing effort. No accessory muscle use.  Cardiovascular: Irregular heart rate, no murmurs / rubs / gallops.  2+ extremity edema. 2+ pedal pulses. No carotid bruits.  Abdomen: no tenderness, no masses palpated. No hepatosplenomegaly. Bowel sounds positive.  Musculoskeletal: no clubbing / cyanosis. No joint deformity upper and lower extremities. Good ROM, no contractures. Normal muscle tone.  Skin: no rashes, lesions, ulcers. No induration Neurologic: CN 2-12 grossly intact. Sensation intact, DTR normal. Strength 5/5 in all 4.  Psychiatric: Normal judgment and insight. Alert and oriented x 3. Normal mood.     Labs on Admission: I have personally reviewed following labs and imaging studies  CBC: Recent Labs  Lab 02/04/23 1019 02/04/23 1328  WBC 13.8*  --   NEUTROABS 12.4*  --   HGB 12.4* 12.6*  HCT 40.4 37.0*  MCV 83.6  --   PLT 230  --    Basic Metabolic Panel: Recent Labs  Lab 02/04/23 1019 02/04/23 1328  NA 137 136  K 4.1 4.4  CL 92*  --   CO2 35*  --   GLUCOSE 152*  --   BUN 14  --   CREATININE 0.76  --   CALCIUM 9.1  --    GFR: Estimated Creatinine Clearance: 69.8 mL/min (by C-G formula based on SCr of 0.76 mg/dL). Liver Function Tests: Recent Labs  Lab 02/04/23 1019  AST 32  ALT 42  ALKPHOS 69  BILITOT 0.6   PROT 6.6  ALBUMIN 3.6   No results for input(s): "LIPASE", "AMYLASE" in the last 168 hours. No results for input(s): "AMMONIA" in the last 168 hours. Coagulation Profile: No results for input(s): "INR", "PROTIME" in the last 168 hours. Cardiac Enzymes: No results for input(s): "CKTOTAL", "CKMB", "CKMBINDEX", "TROPONINI" in the last 168 hours. BNP (last 3 results) No results for input(s): "PROBNP" in the last 8760 hours. HbA1C: No results for input(s): "HGBA1C" in the last 72 hours. CBG: No results for input(s): "GLUCAP" in the last 168 hours. Lipid Profile: No results for input(s): "CHOL", "HDL", "LDLCALC", "TRIG", "CHOLHDL", "LDLDIRECT" in the last 72 hours. Thyroid Function Tests: No results for input(s): "TSH", "T4TOTAL", "FREET4", "T3FREE", "THYROIDAB" in the last 72 hours. Anemia Panel: No results for input(s): "VITAMINB12", "FOLATE", "FERRITIN", "TIBC", "IRON", "RETICCTPCT" in the last 72 hours. Urine analysis:    Component Value Date/Time   COLORURINE YELLOW 01/25/2023 2000   APPEARANCEUR CLEAR 01/25/2023 2000   LABSPEC 1.013 01/25/2023 2000   PHURINE 6.0 01/25/2023 2000   GLUCOSEU NEGATIVE 01/25/2023 2000   HGBUR NEGATIVE 01/25/2023 2000   BILIRUBINUR NEGATIVE 01/25/2023 2000   BILIRUBINUR Negative 05/10/2021 [place] 01/25/2023 2000   PROTEINUR NEGATIVE 01/25/2023 2000   UROBILINOGEN negative (A) 05/10/2021 0931   NITRITE NEGATIVE 01/25/2023 2000   LEUKOCYTESUR NEGATIVE 01/25/2023 2000    Radiological Exams on Admission: CT Angio Chest PE W and/or Wo Contrast  Result Date: 02/04/2023 CLINICAL DATA:  Shortness of breath x2 days, bilateral lower extremity swelling, recent hospitalization for AFib, evaluate for PE EXAM: CT ANGIOGRAPHY CHEST WITH CONTRAST TECHNIQUE: Multidetector CT imaging of the chest was performed using the standard protocol during bolus administration of intravenous contrast. Multiplanar CT image reconstructions and MIPs were  obtained to evaluate the vascular anatomy. RADIATION DOSE REDUCTION: This exam was performed according to the departmental dose-optimization program which includes automated exposure control, adjustment of the mA and/or kV according to patient size and/or use of iterative reconstruction technique. CONTRAST:  54m OMNIPAQUE IOHEXOL 350 MG/ML SOLN COMPARISON:  CTA chest dated 01/25/2023 FINDINGS: Cardiovascular: Satisfactory opacification the bilateral pulmonary arteries to the segmental level. No evidence of pulmonary embolism. Although not tailored for evaluation of the thoracic aorta, there is no evidence thoracic aortic aneurysm or dissection. Atherosclerotic calcifications of the aortic arch. Moderate three-vessel coronary atherosclerosis. Mediastinum/Nodes: No suspicious mediastinal lymphadenopathy. Visualized thyroid is unremarkable. Lungs/Pleura: Biapical pleural-parenchymal scarring. Moderate centrilobular and paraseptal emphysematous changes, upper lung predominant. No suspicious pulmonary nodules. No focal consolidation. No pleural effusion or pneumothorax. Upper Abdomen: Visualized upper abdomen is grossly unremarkable, noting vascular calcifications. Musculoskeletal: Moderate to severe compression fracture deformities at T4, T5, T7, and T8, chronic. Prior vertebral augmentation at L1. Review of the MIP images confirms the above findings. IMPRESSION: No evidence of pulmonary embolism. No evidence of acute cardiopulmonary disease. Aortic Atherosclerosis (ICD10-I70.0) and Emphysema (ICD10-J43.9). Electronically Signed   By: SJulian HyM.D.   On: 02/04/2023 11:48   DG Chest 2 View  Result Date: 02/04/2023 CLINICAL DATA:  cp EXAM: CHEST - 2 VIEW COMPARISON:  01/25/2023. FINDINGS: The heart size and mediastinal contours are within normal limits. Both lungs are clear. No pneumothorax or pleural effusion. Aorta is calcified. There are thoracic degenerative changes. IMPRESSION: No active cardiopulmonary  disease. Electronically Signed   By: JSammie BenchM.D.   On: 02/04/2023 10:48    EKG: Independently reviewed.  A-fib  with RVR  Assessment/Plan Principal Problem:   Hypoxia Active Problems:   COPD GOLD  III    Chronic diastolic CHF (congestive heart failure) (HCC)   Acute bronchitis with COPD (HCC)   Atrial fibrillation with RVR (HCC)   CHF (congestive heart failure) (HCC)  (please populate well all problems here in Problem List. (For example, if patient is on BP meds at home and you resume or decide to hold them, it is a problem that needs to be her. Same for CAD, COPD, HLD and so on)  Acute on chronic HFpEF decompensation -Probably related to uncontrolled hypertension and uncontrolled A-fib -Will have to resume beta-blocker, as mentioned on discharge summary from last admission, patient appears to be sensitive to bisoprolol, will change to Coreg due to address post heart rate and blood pressure issue. -Will give digoxin after loading with 0.125 mg every 6 hours x 2 doses -Continue IV Lasix twice daily  A-fib with RVR -Rate control strategy as above  Acute COPD exacerbation -Given patient had frequent exacerbations in last weeks, decided to treat with antibiotics at this time with doxycycline -IV Solu-Medrol bridging for p.o. prednisone  Leukocytosis -Probably related to ongoing steroid use, no significant infiltrates on chest x-ray.  On doxycycline to treat refractory COPD exacerbation  IIDM -Stable -Sliding scale     DVT prophylaxis: Lovenox Code Status: Full code Family Communication: None at bedside Disposition Plan: Patient is sick with recurrent CHF decompensation uncontrolled A-fib and COPD exacerbation, requiring IV diuresis, close monitoring for rate control and inpatient COPD treatment, expect more than 2 midnight hospital stay Consults called: None Admission status: Tele admit   Lequita Halt MD [facility] Pager [phone]  02/04/2023, 6:43 PM

## 2023-02-04 NOTE — ED Notes (Signed)
Rt assessed in triage. SOB noted. SAT 100% on 2L per home use. BBS diminished, slight exp wheezes. Moving to room 1.

## 2023-02-04 NOTE — ED Notes (Signed)
Called [facility] for transport at 3:44 talked to [facility]

## 2023-02-04 NOTE — ED Notes (Signed)
HHN Rx administered via air at 6lpm

## 2023-02-04 NOTE — ED Triage Notes (Signed)
Pt here for shob worse w/ exetion and intermittent cp x 2 days. Per wife, tp was at [facility] for afib last week, d/c on Tuesday. She states pt's SpO2 dropped to 82% on his baseline 2L O2 while getting dressed this morning. Pt reports bilateral leg edema. CP is non-radiating, and worse w/ exertion.

## 2023-02-05 ENCOUNTER — Inpatient Hospital Stay: Payer: Medicare Other | Admitting: Family Medicine

## 2023-02-05 DIAGNOSIS — R0902 Hypoxemia: Secondary | ICD-10-CM | POA: Diagnosis not present

## 2023-02-05 LAB — GLUCOSE, CAPILLARY
Glucose-Capillary: 130 mg/dL — ABNORMAL HIGH (ref 70–99)
Glucose-Capillary: 112 mg/dL — ABNORMAL HIGH (ref 70–99)
Glucose-Capillary: 161 mg/dL — ABNORMAL HIGH (ref 70–99)
Glucose-Capillary: 93 mg/dL (ref 70–99)

## 2023-02-05 LAB — BASIC METABOLIC PANEL
Anion gap: 12 (ref 5–15)
BUN: 19 mg/dL (ref 8–23)
CO2: 33 mmol/L — ABNORMAL HIGH (ref 22–32)
Calcium: 8.9 mg/dL (ref 8.9–10.3)
Chloride: 93 mmol/L — ABNORMAL LOW (ref 98–111)
Creatinine, Ser: 0.97 mg/dL (ref 0.61–1.24)
GFR, Estimated: >60 mL/min (ref >60)
Glucose, Bld: 100 mg/dL — ABNORMAL HIGH (ref 70–99)
Potassium: 4.8 mmol/L (ref 3.5–5.1)
Sodium: 138 mmol/L (ref 135–145)

## 2023-02-05 MED ORDER — SENNOSIDES-DOCUSATE SODIUM 8.6-50 MG PO TABS
1.0000 | ORAL_TABLET | Freq: Two times a day (BID) | ORAL | Status: DC
  Administered 2023-02-06: 1 via ORAL
  Filled 2023-02-05 (×4): qty 1

## 2023-02-05 MED ORDER — POLYETHYLENE GLYCOL 3350 17 G PO PACK: 17.0000 g | PACK | Freq: Every day | ORAL | Status: DC | PRN

## 2023-02-05 MED ORDER — DIGOXIN 125 MCG PO TABS
0.1250 mg | ORAL_TABLET | Freq: Every day | ORAL | Status: DC
  Administered 2023-02-06 – 2023-02-07 (×2): 0.125 mg via ORAL
  Filled 2023-02-05 (×2): qty 1

## 2023-02-05 MED ORDER — FUROSEMIDE 10 MG/ML IJ SOLN
40.0000 mg | Freq: Two times a day (BID) | INTRAMUSCULAR | Status: DC
  Administered 2023-02-05 (×2): 40 mg via INTRAVENOUS
  Filled 2023-02-05 (×2): qty 4

## 2023-02-05 NOTE — Discharge Instructions (Signed)

## 2023-02-05 NOTE — Evaluation (Signed)
Occupational Therapy Evaluation and Discharge Patient Details Name: Paul [name] MRN: [id number] DOB: 01-Oct-1946 Today's Date: 02/05/2023   History of Present Illness Pt is 77 yo male who presents with progressive cough on 2/13, shortness of breath and lower extremity edema. PMH includes COPD on O2 2L chronically, A-fib not on anticoagulant sp watchman, DM2 CHF diastolic HLD anemia Recently admitted with COPD exacerbation on 01/25/23 and again prior to that.   Clinical Impression   Pt is functioning modified independently, has all necessary DME and is knowledgeable in energy conservation strategies and pursed lip breathing. No OT needs.      Recommendations for follow up therapy are one component of a multi-disciplinary discharge planning process, led by the attending physician.  Recommendations may be updated based on patient status, additional functional criteria and insurance authorization.   Follow Up Recommendations  No OT follow up     Assistance Recommended at Discharge PRN  Patient can return home with the following Assistance with cooking/housework;Help with stairs or ramp for entrance    Functional Status Assessment  Patient has not had a recent decline in their functional status  Equipment Recommendations  None recommended by OT    Recommendations for Other Services       Precautions / Restrictions Precautions Precautions: None Restrictions Weight Bearing Restrictions: No      Mobility Bed Mobility Overal bed mobility: Modified Independent                  Transfers Overall transfer level: Modified independent                        Balance                                           ADL either performed or assessed with clinical judgement   ADL Overall ADL's : Modified independent                                     Functional mobility during ADLs: Modified independent General ADL Comments: pt aware of  energy conservation strategies, encouraged use of O2 in shower on tank     Vision Baseline Vision/History: 1 Wears glasses       Perception     Praxis      Pertinent Vitals/Pain Pain Assessment Pain Assessment: No/denies pain     Hand Dominance Right   Extremity/Trunk Assessment Upper Extremity Assessment Upper Extremity Assessment: Overall WFL for tasks assessed   Lower Extremity Assessment Lower Extremity Assessment: Overall WFL for tasks assessed   Cervical / Trunk Assessment Cervical / Trunk Assessment: Normal   Communication Communication Communication: No difficulties   Cognition Arousal/Alertness: Awake/alert Behavior During Therapy: WFL for tasks assessed/performed Overall Cognitive Status: Within Functional Limits for tasks assessed                                       General Comments      Exercises     Shoulder Instructions      Home Living Family/patient expects to be discharged to:: Private residence Living Arrangements: Spouse/significant other Available Help at Discharge: Family;Available 24 hours/day Type of Home: House Home Access: Level entry  Home Layout: One level     Bathroom Shower/Tub: Occupational psychologist: Standard Bathroom Accessibility: Yes   Home Equipment: Rollator (4 wheels);Shower seat;Grab bars - tub/shower   Additional Comments: only uses supplemental O2 (up to 2L) when sleeping and with heavy activity      Prior Functioning/Environment Prior Level of Function : Independent/Modified Independent             Mobility Comments: Pt states that he was not using an AD and has had no recent falls. ADLs Comments: Pt has not been using shower seat but wants to put it back due to recently trying to shower and O2 sats drop to 80%        OT Problem List:        OT Treatment/Interventions:      OT Goals(Current goals can be found in the care plan section)    OT Frequency:       Co-evaluation              AM-PAC OT "6 Clicks" Daily Activity     Outcome Measure Help from another person eating meals?: None Help from another person taking care of personal grooming?: None Help from another person toileting, which includes using toliet, bedpan, or urinal?: None Help from another person bathing (including washing, rinsing, drying)?: None Help from another person to put on and taking off regular upper body clothing?: None Help from another person to put on and taking off regular lower body clothing?: None 6 Click Score: 24   End of Session Equipment Utilized During Treatment: Oxygen (2L)  Activity Tolerance: Patient tolerated treatment well Patient left: in bed;with call bell/phone within reach  OT Visit Diagnosis: Other (comment) (decreased activity tolerance)                Time: 1400-1414 OT Time Calculation (min): 14 min Charges:  OT General Charges $OT Visit: 1 Visit OT Evaluation $OT Eval Low Complexity: 1 Low  Cleta Alberts, OTR/L Acute Rehabilitation Services Office: [phone]   Malka So 02/05/2023, 2:55 PM

## 2023-02-05 NOTE — Progress Notes (Signed)
Nutrition Brief Note  Spoke with patient and his wife at bedside. Patient assessed by RD during prior admission 1/25-1/28 and 2/3-2/6. Patient endorses ongoing adequate PO intake. He continues to eat 3 meals, plus snacks daily. He occasionally will only eat vegetables for a meal. He is mindful of his intake of starches/carbohydrates to manage his blood sugar. Pt reports that he does not often eat sweets and prefers chips and dip.   Discussed a low sodium diet to help manage edema and HF. He and his wife generally cook  most meals at home which they only add salt to cooking and don't use additional table salt. They also season with Adobo seasoning and other various herbs/spices. Handed added to AVS to help with diet recommendations and suggestions.   Wt Readings from Last 15 Encounters:  02/05/23 69.1 kg  01/25/23 73.3 kg  01/22/23 75.8 kg  01/16/23 73.5 kg  01/12/23 73.5 kg  12/05/22 73.5 kg  11/22/22 74.4 kg  11/19/22 74.4 kg  10/23/22 73.3 kg  09/04/22 68.9 kg  08/16/22 68.5 kg  07/25/22 68.9 kg  07/24/22 68.6 kg  07/23/22 68.6 kg  07/21/22 69.9 kg    Body mass index is 24.59 kg/m. Patient meets criteria for normal weight based on current BMI.   Current diet order is Heart Healthy, patient is consuming approximately 100% of meals at this time. Labs and medications reviewed.   No additional nutrition interventions warranted at this time. If nutrition issues arise, please consult RD.   Clayborne Dana, RDN, LDN Clinical Nutrition

## 2023-02-05 NOTE — Evaluation (Signed)
Physical Therapy Evaluation Patient Details Name: Paul [name] MRN: [id number] DOB: 1946-07-03 Today's Date: 02/05/2023  History of Present Illness  Pt is 77 yo male who presents with progressive cough on 2/13, shortness of breath and lower extremity edema. PMH includes COPD on O2 2L chronically, A-fib not on anticoagulant sp watchman, DM2 CHF diastolic HLD anemia Recently admitted with COPD exacerbation on 01/25/23 and again prior to that.  Clinical Impression  Pt currently is limited by respiratory status. Functionally pt is at baseline for all mobility. Due to pt current functional status, home set up and available assistance no recommended skilled physical therapy services at this time. Pt will be signed off for physical therapy services in acute care setting. If further needs arise please re-consult physical therapy. Pt is in agreement with this plan of care.        Recommendations for follow up therapy are one component of a multi-disciplinary discharge planning process, led by the attending physician.  Recommendations may be updated based on patient status, additional functional criteria and insurance authorization.  Follow Up Recommendations No PT follow up      Assistance Recommended at Discharge PRN  Patient can return home with the following  Assistance with cooking/housework    Equipment Recommendations None recommended by PT  Recommendations for Other Services       Functional Status Assessment Patient has not had a recent decline in their functional status     Precautions / Restrictions Precautions Precautions: Fall Restrictions Weight Bearing Restrictions: No      Mobility  Bed Mobility Overal bed mobility: Modified Independent             General bed mobility comments: HOB flat minimal to no use of bed rails. Patient Response: Cooperative  Transfers Overall transfer level: Modified independent Equipment used: None               General transfer  comment: no LOB no AD.    Ambulation/Gait Ambulation/Gait assistance: Modified independent (Device/Increase time) Gait Distance (Feet): 50 Feet Assistive device: None Gait Pattern/deviations: WFL(Within Functional Limits) Gait velocity: WFL Gait velocity interpretation: >4.37 ft/sec, indicative of normal walking speed   General Gait Details: Pt with steady gait and no significant gait deviations. No need for assistance.      Balance Overall balance assessment: No apparent balance deficits (not formally assessed)           Pertinent Vitals/Pain Pain Assessment Pain Assessment: No/denies pain    Home Living Family/patient expects to be discharged to:: Private residence Living Arrangements: Spouse/significant other Available Help at Discharge: Family;Available 24 hours/day Type of Home: House Home Access: Level entry       Home Layout: One level Home Equipment: Rollator (4 wheels);Shower seat;Grab bars - tub/shower Additional Comments: only uses supplemental O2 (up to 2L) when sleeping and with heavy activity    Prior Function               Mobility Comments: Pt states that he was not using an AD and has had no recent falls. ADLs Comments: Pt has not been using shower seat but wants to put it back due to recently trying to shower and O2 sats drop to 80%     Hand Dominance   Dominant Hand: Right    Extremity/Trunk Assessment   Upper Extremity Assessment Upper Extremity Assessment: Overall WFL for tasks assessed    Lower Extremity Assessment Lower Extremity Assessment: Overall WFL for tasks assessed    Cervical /  Trunk Assessment Cervical / Trunk Assessment: Normal  Communication   Communication: No difficulties  Cognition Arousal/Alertness: Awake/alert Behavior During Therapy: WFL for tasks assessed/performed Overall Cognitive Status: Within Functional Limits for tasks assessed        General Comments General comments (skin integrity, edema,  etc.): SPO2 97% on 3 L in resting, pt states O2 was dropping with mobility, Increased O2 to 5 L and SPO2 97% getting back to bed. Pt left on 2 L lying in bed.        Assessment/Plan    PT Assessment Patient does not need any further PT services  PT Problem List         PT Treatment Interventions      PT Goals (Current goals can be found in the Care Plan section)  Acute Rehab PT Goals PT Goal Formulation: All assessment and education complete, DC therapy    Frequency          AM-PAC PT "6 Clicks" Mobility  Outcome Measure Help needed turning from your back to your side while in a flat bed without using bedrails?: None Help needed moving from lying on your back to sitting on the side of a flat bed without using bedrails?: None Help needed moving to and from a bed to a chair (including a wheelchair)?: None Help needed standing up from a chair using your arms (e.g., wheelchair or bedside chair)?: None Help needed to walk in hospital room?: None Help needed climbing 3-5 steps with a railing? : None 6 Click Score: 24    End of Session Equipment Utilized During Treatment: Gait belt Activity Tolerance: Patient tolerated treatment well Patient left: in bed;with call bell/phone within reach;with family/visitor present Nurse Communication: Mobility status      Time: 1215-1232 PT Time Calculation (min) (ACUTE ONLY): 17 min   Charges:   PT Evaluation $PT Eval Low Complexity: [place], DPT, [place] Office: [phone] (Secure chat preferred)   Ander Purpura 02/05/2023, 1:40 PM

## 2023-02-05 NOTE — Progress Notes (Signed)
Heart Failure Navigator Progress Note  Assessed for Heart & Vascular TOC clinic readiness.  Patient EF 60-65%, has a [facility] follow up appointment on 02/18/2023.   Navigator will sign off at this time.    Earnestine Leys, BSN, Clinical cytogeneticist Only

## 2023-02-05 NOTE — Progress Notes (Signed)
PROGRESS NOTE    Paul [name]  [id number] DOB: 04-10-1946 DOA: 02/04/2023 PCP: [name], MD    Brief Narrative:   Paul [name] is a 77 y.o. male with past medical history significant for COPD/chronic respiratory failure on 2 L nasal cannula, paroxysmal atrial fibrillation s/p Watchman procedure, type 2 diabetes mellitus, chronic diastolic congestive heart failure, hyperlipidemia, anemia of chronic medical disease who presented to [facility] ED on 2/13 with progressive cough, shortness of breath and lower extremity edema.  Patient recently hospitalized for similar presentation and discharged on steroid taper and beta-blocker was discontinued due to concerns regarding bradycardia.  Patient initially felt well for 5 days then started having a dry cough, wheezing with increased lower extremity edema.  He also reported that his heart rate was high.  In the ED, temperature 98.5 F, HR 112, RR 24, BP 155/69, SpO2 99% on 2 L nasal cannula.  WBC 13.8, hemoglobin 12.4, platelets 230.  Sodium 137, potassium 4.1, chloride 92, CO2 35, glucose 152.  BUN 14, creatinine 0.76.  BNP 97.3.  High sensitive troponin 9 followed by 9.  Influenza A/B PCR/RSV/COVID-19 PCR negative.  Chest x-ray with no active cardiopulmonary disease process.  CT angiogram chest with no evidence of pulmonary embolism, no evidence of acute cardiopulmonary disease process noted aortic atherosclerosis and emphysema.  Patient was given IV Lasix x 1 by EDP.  TRH consulted for further evaluation management of acute on chronic respite failure secondary to CHF exacerbation/COPD exacerbation.  Assessment & Plan:   Acute on chronic hypoxic respiratory failure Patient presenting to ED with progressive shortness of breath, wheezing, nonproductive cough and lower extremity edema.  Recently hospitalized with COPD exacerbation on steroid taper outpatient.  Patient noted that his oxygen saturation declined significantly at home into the low 80s with  any type of exertion.  Etiology likely multifactorial with acute on chronic CHF exacerbation coupled with COPD exacerbation as well as poorly controlled A-fib with RVR.  CT angiogram chest negative for pulmonary embolism, no infiltrates noted on imaging. -- Continue supplemental oxygen, maintain SpO2 > 88%; baseline 2 L [place] -- Ambulatory O2 screen tomorrow -- Treatment as below  Acute on chronic diastolic congestive heart failure exacerbation Etiology likely secondary to uncontrolled hypertension and atrial fibrillation.  TTE 01/26/2023 with LVEF [id number], grade 1 diastolic dysfunction, trivial MR, no aortic stenosis, IVC normal in size.  Patient with significant lower extremity edema on admission. -- Furosemide 40 mg IV q12h -- Strict I's and O's and daily weights -- Monitor renal function closely while on IV diuretic  COPD exacerbation, acute -- Doxycycline 100 mg p.o. twice daily -- Breo Ellipta and Incruse Ellipta inhaler 1 puff daily -- Prednisone 40 mg p.o. daily -- Albuterol neb every 6 hours as needed shortness of breath/wheezing  Atrial fibrillation with RVR Patient was noted on telemetry to be in and out atrial fibrillation during ED evaluation with heart rate up to the 120s.  Patient reports heart rate up to 160s at home.  On admission patient was loaded with IV digoxin x 2. -- Carvedilol 3.125 mg p.o. twice daily -- digoxin 0.174m PO daily -- Continue to monitor on telemetry  Leukocytosis Etiology likely reactive in the setting of ongoing steroid use.  No infiltrates noted on x-ray or CT chest. --CBC daily  Type 2 diabetes mellitus Hemoglobin A1c 6.3 on 11/18/2022, well-controlled by diet at baseline.  GERD --Protonix 40 mg p.o. twice daily  Anxiety/depression: --Sertraline 50 mg p.o. daily -- Xanax 0.5 mg  p.o. twice daily as needed  HLD: -- Atorvastatin 40 mg p.o. daily   DVT prophylaxis: enoxaparin (LOVENOX) injection 40 mg Start: 02/04/23 2200    Code Status:  Full Code Family Communication: No family present at bedside this morning  Disposition Plan:  Level of care: Telemetry Medical Status is: Inpatient Remains inpatient appropriate because: IV diuresis, anticipate discharge home in 1-2 days    Consultants:  None  Procedures:  None  Antimicrobials:  Doxycycline 2/13>>   Subjective: Patient seen and examined at bedside, resting comfortably.  Lying in bed.  Reports good urine output and lower extremity edema much improved.  Heart rate also much improved as well.  Continues with nonproductive cough and mild shortness of breath.  Remains on 2 L nasal cannula which is his typical baseline.  Discussed with him we will have him perform an ambulatory O2 screen tomorrow to ensure his SpO2 maintains above 88%.  No other specific questions or concerns at this time.  Denies headache, no dizziness, no chest pain, no palpitations, no nausea/vomiting/diarrhea, no fever/chills/night sweats, no abdominal pain, no focal weakness, no fatigue, no paresthesias.  No acute events overnight per nursing staff.  Objective: Vitals:   02/04/23 2358 02/05/23 0500 02/05/23 0517 02/05/23 0755  BP: (!) 143/66  (!) 150/67 (!) 150/72  Pulse: 88  96 86  Resp: 19  18 17  $ Temp: 98 F (36.7 C)  97.9 F (36.6 C) 98 F (36.7 C)  TempSrc: Oral  Oral Oral  SpO2: 97%  99% 100%  Weight:  69.1 kg    Height:        Intake/Output Summary (Last 24 hours) at 02/05/2023 1301 Last data filed at 02/05/2023 0800 Gross per 24 hour  Intake 720 ml  Output 1800 ml  Net -1080 ml   Filed Weights   02/04/23 1750 02/05/23 0500  Weight: 71.4 kg 69.1 kg    Examination:  Physical Exam: GEN: NAD, alert and oriented x 3, elderly in appearance HEENT: NCAT, PERRL, EOMI, sclera clear, MMM PULM: Breath sounds slight diminished bilateral bases, noted crackles bilateral bases, normal respiratory effort without accessory muscle use, no wheezing, on 2 L nasal cannula with SpO2 99% at rest  which is his baseline CV: RRR w/o M/G/R GI: abd soft, NTND, NABS, no R/G/M MSK: 1+ bilateral lower extremity peripheral edema, moves all extremities independently NEURO: CN II-XII intact, no focal deficits, sensation to light touch intact PSYCH: normal mood/affect Integumentary: dry/intact, no rashes or wounds    Data Reviewed: I have personally reviewed following labs and imaging studies  CBC: Recent Labs  Lab 02/04/23 1019 02/04/23 1328  WBC 13.8*  --   NEUTROABS 12.4*  --   HGB 12.4* 12.6*  HCT 40.4 37.0*  MCV 83.6  --   PLT 230  --    Basic Metabolic Panel: Recent Labs  Lab 02/04/23 1019 02/04/23 1328 02/05/23 0025  NA 137 136 138  K 4.1 4.4 4.8  CL 92*  --  93*  CO2 35*  --  33*  GLUCOSE 152*  --  100*  BUN 14  --  19  CREATININE 0.76  --  0.97  CALCIUM 9.1  --  8.9   GFR: Estimated Creatinine Clearance: 57.6 mL/min (by C-G formula based on SCr of 0.97 mg/dL). Liver Function Tests: Recent Labs  Lab 02/04/23 1019  AST 32  ALT 42  ALKPHOS 69  BILITOT 0.6  PROT 6.6  ALBUMIN 3.6   No results for input(s): "LIPASE", "  AMYLASE" in the last 168 hours. No results for input(s): "AMMONIA" in the last 168 hours. Coagulation Profile: No results for input(s): "INR", "PROTIME" in the last 168 hours. Cardiac Enzymes: No results for input(s): "CKTOTAL", "CKMB", "CKMBINDEX", "TROPONINI" in the last 168 hours. BNP (last 3 results) No results for input(s): "PROBNP" in the last 8760 hours. HbA1C: No results for input(s): "HGBA1C" in the last 72 hours. CBG: Recent Labs  Lab 02/04/23 2037 02/05/23 0752 02/05/23 1150  GLUCAP 201* 130* 112*   Lipid Profile: No results for input(s): "CHOL", "HDL", "LDLCALC", "TRIG", "CHOLHDL", "LDLDIRECT" in the last 72 hours. Thyroid Function Tests: No results for input(s): "TSH", "T4TOTAL", "FREET4", "T3FREE", "THYROIDAB" in the last 72 hours. Anemia Panel: No results for input(s): "VITAMINB12", "FOLATE", "FERRITIN", "TIBC",  "IRON", "RETICCTPCT" in the last 72 hours. Sepsis Labs: No results for input(s): "PROCALCITON", "LATICACIDVEN" in the last 168 hours.  Recent Results (from the past 240 hour(s))  Resp panel by RT-PCR (RSV, Flu A&B, Covid) Anterior Nasal Swab     Status: None   Collection Time: 02/04/23 10:19 AM   Specimen: Anterior Nasal Swab  Result Value Ref Range Status   SARS Coronavirus 2 by RT PCR NEGATIVE NEGATIVE Final    Comment: (NOTE) SARS-CoV-2 target nucleic acids are NOT DETECTED.  The SARS-CoV-2 RNA is generally detectable in upper respiratory specimens during the acute phase of infection. The lowest concentration of SARS-CoV-2 viral copies this assay can detect is 138 copies/mL. A negative result does not preclude SARS-Cov-2 infection and should not be used as the sole basis for treatment or other patient management decisions. A negative result may occur with  improper specimen collection/handling, submission of specimen other than nasopharyngeal swab, presence of viral mutation(s) within the areas targeted by this assay, and inadequate number of viral copies(<138 copies/mL). A negative result must be combined with clinical observations, patient history, and epidemiological information. The expected result is Negative.  Fact Sheet for Patients:  EntrepreneurPulse.com.au  Fact Sheet for Healthcare Providers:  IncredibleEmployment.be  This test is no t yet approved or cleared by the [place] FDA and  has been authorized for detection and/or diagnosis of SARS-CoV-2 by FDA under an Emergency Use Authorization (EUA). This EUA will remain  in effect (meaning this test can be used) for the duration of the COVID-19 declaration under Section 564(b)(1) of the Act, 21 U.S.C.section 360bbb-3(b)(1), unless the authorization is terminated  or revoked sooner.       Influenza A by PCR NEGATIVE NEGATIVE Final   Influenza B by PCR NEGATIVE NEGATIVE  Final    Comment: (NOTE) The Xpert Xpress SARS-CoV-2/FLU/RSV plus assay is intended as an aid in the diagnosis of influenza from Nasopharyngeal swab specimens and should not be used as a sole basis for treatment. Nasal washings and aspirates are unacceptable for Xpert Xpress SARS-CoV-2/FLU/RSV testing.  Fact Sheet for Patients: EntrepreneurPulse.com.au  Fact Sheet for Healthcare Providers: IncredibleEmployment.be  This test is not yet approved or cleared by the [place] FDA and has been authorized for detection and/or diagnosis of SARS-CoV-2 by FDA under an Emergency Use Authorization (EUA). This EUA will remain in effect (meaning this test can be used) for the duration of the COVID-19 declaration under Section 564(b)(1) of the Act, 21 U.S.C. section 360bbb-3(b)(1), unless the authorization is terminated or revoked.     Resp Syncytial Virus by PCR NEGATIVE NEGATIVE Final    Comment: (NOTE) Fact Sheet for Patients: EntrepreneurPulse.com.au  Fact Sheet for Healthcare Providers: IncredibleEmployment.be  This test is  not yet approved or cleared by the [place] and has been authorized for detection and/or diagnosis of SARS-CoV-2 by FDA under an Emergency Use Authorization (EUA). This EUA will remain in effect (meaning this test can be used) for the duration of the COVID-19 declaration under Section 564(b)(1) of the Act, 21 U.S.C. section 360bbb-3(b)(1), unless the authorization is terminated or revoked.  Performed at [facility], [address] [place], [place] [address]          Radiology Studies: CT Angio Chest PE W and/or Wo Contrast  Result Date: 02/04/2023 CLINICAL DATA:  Shortness of breath x2 days, bilateral lower extremity swelling, recent hospitalization for AFib, evaluate for PE EXAM: CT ANGIOGRAPHY CHEST WITH CONTRAST TECHNIQUE: Multidetector CT imaging of the  chest was performed using the standard protocol during bolus administration of intravenous contrast. Multiplanar CT image reconstructions and MIPs were obtained to evaluate the vascular anatomy. RADIATION DOSE REDUCTION: This exam was performed according to the departmental dose-optimization program which includes automated exposure control, adjustment of the mA and/or kV according to patient size and/or use of iterative reconstruction technique. CONTRAST:  27m OMNIPAQUE IOHEXOL 350 MG/ML SOLN COMPARISON:  CTA chest dated 01/25/2023 FINDINGS: Cardiovascular: Satisfactory opacification the bilateral pulmonary arteries to the segmental level. No evidence of pulmonary embolism. Although not tailored for evaluation of the thoracic aorta, there is no evidence thoracic aortic aneurysm or dissection. Atherosclerotic calcifications of the aortic arch. Moderate three-vessel coronary atherosclerosis. Mediastinum/Nodes: No suspicious mediastinal lymphadenopathy. Visualized thyroid is unremarkable. Lungs/Pleura: Biapical pleural-parenchymal scarring. Moderate centrilobular and paraseptal emphysematous changes, upper lung predominant. No suspicious pulmonary nodules. No focal consolidation. No pleural effusion or pneumothorax. Upper Abdomen: Visualized upper abdomen is grossly unremarkable, noting vascular calcifications. Musculoskeletal: Moderate to severe compression fracture deformities at T4, T5, T7, and T8, chronic. Prior vertebral augmentation at L1. Review of the MIP images confirms the above findings. IMPRESSION: No evidence of pulmonary embolism. No evidence of acute cardiopulmonary disease. Aortic Atherosclerosis (ICD10-I70.0) and Emphysema (ICD10-J43.9). Electronically Signed   By: SJulian HyM.D.   On: 02/04/2023 11:48   DG Chest 2 View  Result Date: 02/04/2023 CLINICAL DATA:  cp EXAM: CHEST - 2 VIEW COMPARISON:  01/25/2023. FINDINGS: The heart size and mediastinal contours are within normal limits. Both  lungs are clear. No pneumothorax or pleural effusion. Aorta is calcified. There are thoracic degenerative changes. IMPRESSION: No active cardiopulmonary disease. Electronically Signed   By: JSammie BenchM.D.   On: 02/04/2023 10:48        Scheduled Meds:  aspirin EC  81 mg Oral Daily   atorvastatin  40 mg Oral QHS   carvedilol  3.125 mg Oral BID WC   doxycycline  100 mg Oral Q12H   enoxaparin (LOVENOX) injection  40 mg Subcutaneous Q24H   ferrous sulfate  325 mg Oral BID WC   fluticasone furoate-vilanterol  1 puff Inhalation Daily   And   umeclidinium bromide  1 puff Inhalation Daily   furosemide  40 mg Intravenous Q12H   guaiFENesin  600 mg Oral BID   insulin aspart  0-15 Units Subcutaneous TID WC   loratadine  10 mg Oral q AM   melatonin  12.5 mg Oral QHS   pantoprazole  40 mg Oral BID   predniSONE  40 mg Oral Q breakfast   senna-docusate  1 tablet Oral BID   sertraline  50 mg Oral QHS   sodium chloride flush  3 mL Intravenous Q12H   Continuous  Infusions:  sodium chloride       LOS: 1 day    Time spent: 52 minutes spent on chart review, discussion with nursing staff, consultants, updating family and interview/physical exam; more than 50% of that time was spent in counseling and/or coordination of care.    Allexa Acoff J [place] DO [facility] Available via Epic secure chat 7am-7pm After these hours, please refer to coverage provider listed on [email or web address] 02/05/2023, 1:01 PM

## 2023-02-05 NOTE — Care Management (Signed)
  Transition of Care [facility]) Screening Note   Patient Details  Name: Paul [name] Date of Birth: 04-Sep-1946   [facility] of [facility]) CM/SW Contact:    Carles Collet, RN Phone Number: 02/05/2023, 4:29 PM    Transition of Care Department [facility] has reviewed patient and no TOC needs have been identified at this time. We will continue to monitor patient advancement through interdisciplinary progression rounds.   Anticipate ambulatory sats tomorrow, no HH needs.

## 2023-02-06 DIAGNOSIS — R0902 Hypoxemia: Secondary | ICD-10-CM | POA: Diagnosis not present

## 2023-02-06 LAB — BASIC METABOLIC PANEL
Anion gap: 12 (ref 5–15)
BUN: 24 mg/dL — ABNORMAL HIGH (ref 8–23)
CO2: 30 mmol/L (ref 22–32)
Calcium: 8.3 mg/dL — ABNORMAL LOW (ref 8.9–10.3)
Chloride: 91 mmol/L — ABNORMAL LOW (ref 98–111)
Creatinine, Ser: 0.85 mg/dL (ref 0.61–1.24)
GFR, Estimated: >60 mL/min (ref >60)
Glucose, Bld: 181 mg/dL — ABNORMAL HIGH (ref 70–99)
Potassium: 3.5 mmol/L (ref 3.5–5.1)
Sodium: 133 mmol/L — ABNORMAL LOW (ref 135–145)

## 2023-02-06 LAB — GLUCOSE, CAPILLARY
Glucose-Capillary: 106 mg/dL — ABNORMAL HIGH (ref 70–99)
Glucose-Capillary: 143 mg/dL — ABNORMAL HIGH (ref 70–99)
Glucose-Capillary: 168 mg/dL — ABNORMAL HIGH (ref 70–99)
Glucose-Capillary: 130 mg/dL — ABNORMAL HIGH (ref 70–99)

## 2023-02-06 LAB — MYCOPLASMA PNEUMONIAE ANTIBODY, IGM: Mycoplasma pneumo IgM: <770 U/mL (ref 0–769)

## 2023-02-06 MED ORDER — FUROSEMIDE 40 MG PO TABS
40.0000 mg | ORAL_TABLET | Freq: Two times a day (BID) | ORAL | Status: DC
  Administered 2023-02-06 – 2023-02-07 (×3): 40 mg via ORAL
  Filled 2023-02-06 (×3): qty 1

## 2023-02-06 MED ORDER — CARVEDILOL 6.25 MG PO TABS
6.2500 mg | ORAL_TABLET | Freq: Two times a day (BID) | ORAL | Status: DC
  Administered 2023-02-06 – 2023-02-07 (×2): 6.25 mg via ORAL
  Filled 2023-02-06 (×3): qty 1

## 2023-02-06 NOTE — Progress Notes (Signed)
   02/06/23 1058  Mobility  Activity Ambulated independently in hallway  Level of Assistance Independent  Assistive Device None  Distance Ambulated (ft) 250 ft  Activity Response Tolerated well  Mobility Referral Yes  $Mobility charge 1 Mobility   Mobility Specialist Progress Note  Pre-Mobility: 97% SpO2(RA)  During Mobility: 87-90% SpO2(RA) Post-Mobility: 89% SpO2(RA)   Pt in chair and agreeable. X1 standing breaks d/t O2 level dropping but recovered w/ pursed lip breathing. Returned to chair w/ all needs met and call bell in reach  [place] Specialist  Please contact via Solicitor or Rehab office at [phone]

## 2023-02-06 NOTE — Progress Notes (Signed)
PROGRESS NOTE    Paul [name]  [id number] DOB: 17-Mar-1946 DOA: 02/04/2023 PCP: [name], MD    Brief Narrative:   Paul [name] is a 77 y.o. 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  Patient recently hospitalized for similar presentation and discharged on steroid taper and beta-blocker was discontinued due to concerns regarding bradycardia.  Patient initially felt well for 5 days then started having a dry cough, wheezing with increased lower extremity edema.  He also reported that his heart rate was high.  In the ED, temperature 98.5 F, HR 112, RR 24, BP 155/69, SpO2 99% on 2 L nasal cannula.  WBC 13.8, hemoglobin 12.4, platelets 230.  Sodium 137, potassium 4.1, chloride 92, CO2 35, glucose 152.  BUN 14, creatinine 0.76.  BNP 97.3.  High sensitive troponin 9 followed by 9.  Influenza A/B PCR/RSV/COVID-19 PCR negative.  Chest x-ray with no active cardiopulmonary disease process.  CT angiogram chest with no evidence of pulmonary embolism, no evidence of acute cardiopulmonary disease process noted aortic atherosclerosis and emphysema.  Patient was given IV Lasix x 1 by EDP.  TRH consulted for further evaluation management of acute on chronic respite failure secondary to CHF exacerbation/COPD exacerbation.  Assessment & Plan:   Acute on chronic hypoxic respiratory failure Patient presenting to ED with progressive shortness of breath, wheezing, nonproductive cough and lower extremity edema.  Recently hospitalized with COPD exacerbation on steroid taper outpatient.  Patient noted that his oxygen saturation declined significantly at home into the low 80s with  any type of exertion.  Etiology likely multifactorial with acute on chronic CHF exacerbation coupled with COPD exacerbation as well as poorly controlled A-fib with RVR.  CT angiogram chest negative for pulmonary embolism, no infiltrates noted on imaging. -- Continue supplemental oxygen, maintain SpO2 > 88%; baseline 2 L [place] -- Ambulatory O2 screen today -- Treatment as below  Acute on chronic diastolic congestive heart failure exacerbation Etiology likely secondary to uncontrolled hypertension and atrial fibrillation.  TTE 01/26/2023 with LVEF [id number], grade 1 diastolic dysfunction, trivial MR, no aortic stenosis, IVC normal in size.  Patient with significant lower extremity edema on admission. -- Incision IV furosemide 40 mg IV q12h to 40 mg p.o. every 12 hours today -- Strict I's and O's and daily weights -- Monitor renal function closely while on IV diuretic  COPD exacerbation, acute -- Doxycycline 100 mg p.o. twice daily -- Breo Ellipta and Incruse Ellipta inhaler 1 puff daily -- Prednisone 40 mg p.o. daily -- Albuterol neb every 6 hours as needed shortness of breath/wheezing  Atrial fibrillation with RVR Patient was noted on telemetry to be in and out atrial fibrillation during ED evaluation with heart rate up to the 120s.  Patient reports heart rate up to 160s at home.  On admission patient was loaded with IV digoxin x 2. -- Increase carvedilol to 6.25 mg p.o. twice daily -- digoxin 0.140m PO daily -- Continue to monitor on telemetry  Leukocytosis Etiology likely reactive in the setting of ongoing steroid use.  No infiltrates noted on x-ray or CT chest. --CBC in the a.m.  Type 2 diabetes mellitus Hemoglobin A1c 6.3 on 11/18/2022, well-controlled by diet at baseline.  GERD --Protonix 40 mg  p.o. twice daily  Anxiety/depression: --Sertraline 50 mg p.o. daily -- Xanax 0.5 mg p.o. twice daily as needed  HLD: -- Atorvastatin 40 mg p.o. daily   DVT prophylaxis: enoxaparin  (LOVENOX) injection 40 mg Start: 02/04/23 2200    Code Status: Full Code Family Communication: No family present at bedside this morning  Disposition Plan:  Level of care: Telemetry Medical Status is: Inpatient Remains inpatient appropriate because: Transitioning to oral diuretic today, continue to monitor on telemetry anticipate discharge home tomorrow  Consultants:  None  Procedures:  None  Antimicrobials:  Doxycycline 2/13>>   Subjective: Patient seen and examined at bedside, resting comfortably.  Lying in bed.  Son present at bedside.  Shortness of breath overall improved as well as lower extremity edema.  Discussed will obtain ambulatory O2 screen today on his 2 L nasal cannula.  Also transitioning IV Lasix to oral today.  No other specific questions or concerns at this time.  Denies headache, no dizziness, no chest pain, no palpitations, no nausea/vomiting/diarrhea, no fever/chills/night sweats, no abdominal pain, no focal weakness, no fatigue, no paresthesias.  No acute events overnight per nursing staff.  Objective: Vitals:   02/06/23 0500 02/06/23 0756 02/06/23 0910 02/06/23 0911  BP:  (!) 153/71    Pulse:  84    Resp:  17    Temp:  98.5 F (36.9 C)    TempSrc:  Oral    SpO2:  91% 98% 98%  Weight: 70.4 kg     Height:        Intake/Output Summary (Last 24 hours) at 02/06/2023 1219 Last data filed at 02/06/2023 1211 Gross per 24 hour  Intake 1480 ml  Output 2760 ml  Net -1280 ml   Filed Weights   02/04/23 1750 02/05/23 0500 02/06/23 0500  Weight: 71.4 kg 69.1 kg 70.4 kg    Examination:  Physical Exam: GEN: NAD, alert and oriented x 3, elderly in appearance HEENT: NCAT, PERRL, EOMI, sclera clear, MMM PULM: Breath sounds slight diminished bilateral bases, normal respiratory effort without accessory muscle use, no wheezing, on 2 L nasal cannula with SpO2 98% at rest which is his baseline CV: RRR w/o M/G/R GI: abd soft, NTND, NABS, no R/G/M MSK: Trace  bilateral lower extremity peripheral edema, moves all extremities independently NEURO: CN II-XII intact, no focal deficits, sensation to light touch intact PSYCH: normal mood/affect Integumentary: Bilateral shin abrasions noted with dressings in place, dressings clean/dry/intact, no other concerning rashes/lesions/wounds noted on exposed skin surfaces.     Data Reviewed: I have personally reviewed following labs and imaging studies  CBC: Recent Labs  Lab 02/04/23 1019 02/04/23 1328  WBC 13.8*  --   NEUTROABS 12.4*  --   HGB 12.4* 12.6*  HCT 40.4 37.0*  MCV 83.6  --   PLT 230  --    Basic Metabolic Panel: Recent Labs  Lab 02/04/23 1019 02/04/23 1328 02/05/23 0025 02/06/23 0358  NA 137 136 138 133*  K 4.1 4.4 4.8 3.5  CL 92*  --  93* 91*  CO2 35*  --  33* 30  GLUCOSE 152*  --  100* 181*  BUN 14  --  19 24*  CREATININE 0.76  --  0.97 0.85  CALCIUM 9.1  --  8.9 8.3*   GFR: Estimated Creatinine Clearance: 65.7 mL/min (by C-G formula based on SCr of 0.85 mg/dL). Liver Function Tests: Recent Labs  Lab 02/04/23 1019  AST 32  ALT 42  ALKPHOS 69  BILITOT 0.6  PROT 6.6  ALBUMIN 3.6   No results for input(s): "LIPASE", "AMYLASE" in the last 168 hours. No results for input(s): "AMMONIA" in the last 168 hours. Coagulation Profile: No results for input(s): "INR", "PROTIME" in the last 168 hours. Cardiac Enzymes: No results for input(s): "CKTOTAL", "CKMB", "CKMBINDEX", "TROPONINI" in the last 168 hours. BNP (last 3 results) No results for input(s): "PROBNP" in the last 8760 hours. HbA1C: No results for input(s): "HGBA1C" in the last 72 hours. CBG: Recent Labs  Lab 02/05/23 1150 02/05/23 1635 02/05/23 2024 02/06/23 0801 02/06/23 1154  GLUCAP 112* 161* 93 106* 143*   Lipid Profile: No results for input(s): "CHOL", "HDL", "LDLCALC", "TRIG", "CHOLHDL", "LDLDIRECT" in the last 72 hours. Thyroid Function Tests: No results for input(s): "TSH", "T4TOTAL", "FREET4",  "T3FREE", "THYROIDAB" in the last 72 hours. Anemia Panel: No results for input(s): "VITAMINB12", "FOLATE", "FERRITIN", "TIBC", "IRON", "RETICCTPCT" in the last 72 hours. Sepsis Labs: No results for input(s): "PROCALCITON", "LATICACIDVEN" in the last 168 hours.  Recent Results (from the past 240 hour(s))  Resp panel by RT-PCR (RSV, Flu A&B, Covid) Anterior Nasal Swab     Status: None   Collection Time: 02/04/23 10:19 AM   Specimen: Anterior Nasal Swab  Result Value Ref Range Status   SARS Coronavirus 2 by RT PCR NEGATIVE NEGATIVE Final    Comment: (NOTE) SARS-CoV-2 target nucleic acids are NOT DETECTED.  The SARS-CoV-2 RNA is generally detectable in upper respiratory specimens during the acute phase of infection. The lowest concentration of SARS-CoV-2 viral copies this assay can detect is 138 copies/mL. A negative result does not preclude SARS-Cov-2 infection and should not be used as the sole basis for treatment or other patient management decisions. A negative result may occur with  improper specimen collection/handling, submission of specimen other than nasopharyngeal swab, presence of viral mutation(s) within the areas targeted by this assay, and inadequate number of viral copies(<138 copies/mL). A negative result must be combined with clinical observations, patient history, and epidemiological information. The expected result is Negative.  Fact Sheet for Patients:  EntrepreneurPulse.com.au  Fact Sheet for Healthcare Providers:  IncredibleEmployment.be  This test is no t yet approved or cleared by the [place] FDA and  has been authorized for detection and/or diagnosis of SARS-CoV-2 by FDA under an Emergency Use Authorization (EUA). This EUA will remain  in effect (meaning this test can be used) for the duration of the COVID-19 declaration under Section 564(b)(1) of the Act, 21 U.S.C.section 360bbb-3(b)(1), unless the authorization is  terminated  or revoked sooner.       Influenza A by PCR NEGATIVE NEGATIVE Final   Influenza B by PCR NEGATIVE NEGATIVE Final    Comment: (NOTE) The Xpert Xpress SARS-CoV-2/FLU/RSV plus assay is intended as an aid in the diagnosis of influenza from Nasopharyngeal swab specimens and should not be used as a sole basis for treatment. Nasal washings and aspirates are unacceptable for Xpert Xpress SARS-CoV-2/FLU/RSV testing.  Fact Sheet for Patients: EntrepreneurPulse.com.au  Fact Sheet for Healthcare Providers: IncredibleEmployment.be  This test is not yet approved or cleared by the [place] FDA and has been authorized for detection and/or diagnosis of SARS-CoV-2 by FDA under an Emergency Use Authorization (EUA). This EUA will remain in effect (meaning this test can be used) for the duration of the COVID-19 declaration under Section 564(b)(1) of the Act, 21 U.S.C. section 360bbb-3(b)(1), unless the authorization is terminated or revoked.     Resp Syncytial Virus by PCR NEGATIVE NEGATIVE Final  Comment: (NOTE) Fact Sheet for Patients: EntrepreneurPulse.com.au  Fact Sheet for Healthcare Providers: IncredibleEmployment.be  This test is not yet approved or cleared by the [place] FDA and has been authorized for detection and/or diagnosis of SARS-CoV-2 by FDA under an Emergency Use Authorization (EUA). This EUA will remain in effect (meaning this test can be used) for the duration of the COVID-19 declaration under Section 564(b)(1) of the Act, 21 U.S.C. section 360bbb-3(b)(1), unless the authorization is terminated or revoked.  Performed at [facility], [address] [place], [place] [address]          Radiology Studies: No results found.      Scheduled Meds:  aspirin EC  81 mg Oral Daily   atorvastatin  40 mg Oral QHS   carvedilol  6.25 mg Oral BID WC   digoxin   0.125 mg Oral Daily   doxycycline  100 mg Oral Q12H   enoxaparin (LOVENOX) injection  40 mg Subcutaneous Q24H   ferrous sulfate  325 mg Oral BID WC   fluticasone furoate-vilanterol  1 puff Inhalation Daily   And   umeclidinium bromide  1 puff Inhalation Daily   furosemide  40 mg Oral BID   guaiFENesin  600 mg Oral BID   insulin aspart  0-15 Units Subcutaneous TID WC   loratadine  10 mg Oral q AM   melatonin  12.5 mg Oral QHS   pantoprazole  40 mg Oral BID   predniSONE  40 mg Oral Q breakfast   senna-docusate  1 tablet Oral BID   sertraline  50 mg Oral QHS   sodium chloride flush  3 mL Intravenous Q12H   Continuous Infusions:  sodium chloride       LOS: 2 days    Time spent: 48 minutes spent on chart review, discussion with nursing staff, consultants, updating family and interview/physical exam; more than 50% of that time was spent in counseling and/or coordination of care.    Chisum Habenicht J [place] DO [facility] Available via Epic secure chat 7am-7pm After these hours, please refer to coverage provider listed on [email or web address] 02/06/2023, 12:19 PM

## 2023-02-06 NOTE — Plan of Care (Signed)
  Problem: Clinical Measurements: Goal: Ability to maintain clinical measurements within normal limits will improve Outcome: Progressing Goal: Will remain free from infection Outcome: Progressing   

## 2023-02-07 DIAGNOSIS — R0902 Hypoxemia: Secondary | ICD-10-CM | POA: Diagnosis not present

## 2023-02-07 LAB — BASIC METABOLIC PANEL
Anion gap: 9 (ref 5–15)
BUN: 24 mg/dL — ABNORMAL HIGH (ref 8–23)
CO2: 36 mmol/L — ABNORMAL HIGH (ref 22–32)
Calcium: 8.9 mg/dL (ref 8.9–10.3)
Chloride: 93 mmol/L — ABNORMAL LOW (ref 98–111)
Creatinine, Ser: 1.01 mg/dL (ref 0.61–1.24)
GFR, Estimated: >60 mL/min (ref >60)
Glucose, Bld: 103 mg/dL — ABNORMAL HIGH (ref 70–99)
Potassium: 4.6 mmol/L (ref 3.5–5.1)
Sodium: 138 mmol/L (ref 135–145)

## 2023-02-07 LAB — CBC
HCT: 38.7 % — ABNORMAL LOW (ref 39.0–52.0)
Hemoglobin: 12.7 g/dL — ABNORMAL LOW (ref 13.0–17.0)
MCH: 26.7 pg (ref 26.0–34.0)
MCHC: 32.8 g/dL (ref 30.0–36.0)
MCV: 81.3 fL (ref 80.0–100.0)
Platelets: 223 10*3/uL (ref 150–400)
RBC: 4.76 MIL/uL (ref 4.22–5.81)
RDW: 14.9 % (ref 11.5–15.5)
WBC: 12 10*3/uL — ABNORMAL HIGH (ref 4.0–10.5)
nRBC: 0 % (ref 0.0–0.2)

## 2023-02-07 LAB — GLUCOSE, CAPILLARY: Glucose-Capillary: 129 mg/dL — ABNORMAL HIGH (ref 70–99)

## 2023-02-07 LAB — MAGNESIUM: Magnesium: 2 mg/dL (ref 1.7–2.4)

## 2023-02-07 MED ORDER — PREDNISONE 10 MG PO TABS: ORAL_TABLET | ORAL | 0 refills | Status: AC

## 2023-02-07 MED ORDER — DIGOXIN 125 MCG PO TABS: 0.1250 mg | ORAL_TABLET | Freq: Every day | ORAL | 2 refills | Status: DC

## 2023-02-07 MED ORDER — DOXYCYCLINE HYCLATE 100 MG PO TABS: 100.0000 mg | ORAL_TABLET | Freq: Two times a day (BID) | ORAL | 0 refills | Status: AC

## 2023-02-07 MED ORDER — FUROSEMIDE 40 MG PO TABS: 40.0000 mg | ORAL_TABLET | Freq: Two times a day (BID) | ORAL | 2 refills | Status: DC

## 2023-02-07 MED ORDER — CARVEDILOL 6.25 MG PO TABS: 6.2500 mg | ORAL_TABLET | Freq: Two times a day (BID) | ORAL | 2 refills | Status: DC

## 2023-02-07 NOTE — Care Management Important Message (Signed)
Important Message  Patient Details  Name: Paul [name] MRN: [id number] Date of Birth: December 01, 1946   Medicare Important Message Given:  Yes     [name] 02/07/2023, 11:47 AM

## 2023-02-07 NOTE — Progress Notes (Signed)
   02/07/23 1100  Mobility  Activity Ambulated with assistance in room  Level of Assistance Independent  Assistive Device None  Distance Ambulated (ft) 40 ft  Activity Response Tolerated well  Mobility Referral Yes  $Mobility charge 1 Mobility   Mobility Specialist Progress Note  Pt in chair and agreeable. Had no c/o pain. Returned to chair w/ all needs met and call bell in reach.  Lucious Groves Mobility Specialist  Please contact via SecureChat or Rehab office at [phone]

## 2023-02-07 NOTE — Progress Notes (Signed)
   02/07/23 1209  Mobility  Activity Ambulated independently in hallway  Level of Assistance Independent  Assistive Device None  Distance Ambulated (ft) 60 ft  Activity Response Tolerated well  Mobility Referral Yes  $Mobility charge 1 Mobility   Mobility Specialist Progress Note  Pt in BR and agreeable. Had no c/o pain. Left in chair w/ all needs met  Lucious Groves Mobility Specialist  Please contact via [place] or Rehab office at [phone]

## 2023-02-07 NOTE — Discharge Summary (Signed)
Physician Discharge Summary  Paul [name] [id number] DOB: [birth year] DOA: 02/04/2023  PCP: [name], MD  Admit date: 02/04/2023 Discharge date: 02/07/2023  Admitted From: Home Disposition: Home  Recommendations for Outpatient Follow-up:  Follow up with PCP in 1-2 weeks Follow-up with cardiology, Dr. Marlou Porch and Dr. Lysbeth Galas as directed Started on carvedilol 6.25 mg p.o. twice daily and digoxin for A-fib with RVR, now converted to normal sinus rhythm discharge Started on furosemide 40 mg p.o. twice daily for CHF exacerbation Please obtain BMP in one week to ensure potassium and renal function stable Patient instructed to maintain daily weight log, please review at next specialty/PCP visit  Home Health: No needs identified by therapy while inpatient Equipment/Devices: None  Discharge Condition: Stable CODE STATUS: Full code Diet recommendation: Heart healthy diet  History of present illness:  Paul [name] is a 76 y.o. 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   Patient recently hospitalized for similar presentation and discharged on steroid taper and beta-blocker was discontinued due to concerns regarding bradycardia.  Patient initially felt well for 5 days then started having a dry cough, wheezing with increased lower extremity edema.  He also reported that his heart rate was high.   In the ED, temperature 98.5 F, HR 112, RR 24, BP 155/69, SpO2 99% on 2 L nasal cannula.  WBC 13.8, hemoglobin 12.4, platelets 230.  Sodium 137, potassium 4.1, chloride 92, CO2 35, glucose 152.  BUN 14, creatinine 0.76.  BNP 97.3.  High sensitive troponin 9 followed by  9.  Influenza A/B PCR/RSV/COVID-19 PCR negative.  Chest x-ray with no active cardiopulmonary disease process.  CT angiogram chest with no evidence of pulmonary embolism, no evidence of acute cardiopulmonary disease process noted aortic atherosclerosis and emphysema.  Patient was given IV Lasix x 1 by EDP.  TRH consulted for further evaluation management of acute on chronic respite failure secondary to CHF exacerbation/COPD exacerbation.  Hospital course:  Acute on chronic hypoxic respiratory failure Patient presenting to ED with progressive shortness of breath, wheezing, nonproductive cough and lower extremity edema.  Recently hospitalized with COPD exacerbation on steroid taper outpatient.  Patient noted that his oxygen saturation declined significantly at home into the low 80s with any type of exertion.  Etiology likely multifactorial with acute on chronic CHF exacerbation coupled with COPD exacerbation as well as poorly controlled A-fib with RVR. CT angiogram chest negative for pulmonary embolism, no infiltrates noted on imaging.  Treated for COPD and acute on chronic diastolic congestive heart failure exacerbation as below.  On 2 L nasal cannula at baseline.   Acute on chronic diastolic congestive heart failure exacerbation Etiology likely secondary to uncontrolled hypertension and atrial fibrillation.  TTE 01/26/2023 with LVEF [id number], grade 1 diastolic dysfunction, trivial MR, no aortic stenosis, IVC normal in size.  Patient with significant lower extremity edema on admission.  Patient was started on IV furosemide and will transition to 40 mg p.o. twice daily at time of discharge.  Patient was instructed to maintain daily weight log and bring to next PCP/cardiology visit.   COPD exacerbation, acute Continue doxycycline to complete 7-day course on discharge as well as prednisone taper.  Resume home Breztri inhaler on discharge.  On 2 L nasal  cannula at baseline.   Paroxysmal atrial fibrillation with  RVR Patient was noted on telemetry to be in and out atrial fibrillation during ED evaluation with heart rate up to the 120s.  Patient reports heart rate up to 160s at home.  On admission patient was loaded with IV digoxin x 2.  Patient was started on carvedilol and increased to 6.25 mg p.o. twice daily.  Also will continue digoxin 0.125 mg p.o. daily.  At time of discharge, patient's A-fib has converted to normal sinus rhythm.  Has upcoming appointment with electrophysiology for planned ablation.   Leukocytosis Etiology likely reactive in the setting of ongoing steroid use.  No infiltrates noted on x-ray or CT chest.   Type 2 diabetes mellitus Hemoglobin A1c 6.3 on 11/18/2022, well-controlled by diet at baseline.   GERD Protonix 40 mg p.o. twice daily   Anxiety/depression: Sertraline 50 mg p.o. daily, Xanax 0.5 mg p.o. twice daily as needed   HLD: Atorvastatin 40 mg p.o. daily  Discharge Diagnoses:  Principal Problem:   Hypoxia Active Problems:   COPD GOLD  III    Chronic diastolic CHF (congestive heart failure) (HCC)   Acute bronchitis with COPD (HCC)   Atrial fibrillation with RVR (HCC)   CHF (congestive heart failure) [facility])    Discharge Instructions  Discharge Instructions     Call MD for:  difficulty breathing, headache or visual disturbances   Complete by: As directed    Call MD for:  extreme fatigue   Complete by: As directed    Call MD for:  persistant dizziness or light-headedness   Complete by: As directed    Call MD for:  persistant nausea and vomiting   Complete by: As directed    Call MD for:  severe uncontrolled pain   Complete by: As directed    Call MD for:  temperature >100.4   Complete by: As directed    Diet - low sodium heart healthy   Complete by: As directed    Increase activity slowly   Complete by: As directed       Allergies as of 02/07/2023       Reactions   Tape Other (See Comments)   PLEASE USE COBAN WRAP IN LIEU OF "TAPE," as it  "pulls off the skin!!   Daliresp [roflumilast] Diarrhea, Nausea And Vomiting   Levaquin [levofloxacin] Palpitations, Other (See Comments)   Made the B/P fluctuate and heart raced   Alendronate Anxiety, Other (See Comments)   Jittery/nervous        Medication List     STOP taking these medications    guaiFENesin 600 MG 12 hr tablet Commonly known as: MUCINEX       TAKE these medications    acetaminophen 500 MG tablet Commonly known as: TYLENOL Take 1,000 mg by mouth at bedtime as needed (pain).   ALPRAZolam 0.5 MG tablet Commonly known as: XANAX Take 1 tablet (0.5 mg total) by mouth 2 (two) times daily as needed for anxiety.   aspirin EC 81 MG tablet Take 1 tablet (81 mg total) by mouth daily. Swallow whole.   atorvastatin 40 MG tablet Commonly known as: LIPITOR TAKE 1 TABLET BY MOUTH EVERYDAY AT BEDTIME What changed: See the new instructions.   Breztri Aerosphere 160-9-4.8 MCG/ACT Aero Generic drug: Budeson-Glycopyrrol-Formoterol Inhale 2 puffs into the lungs 2 (two) times daily.   calcium carbonate 600 MG Tabs tablet Commonly known as: OS-CAL Take 600 mg by mouth daily.   carvedilol 6.25 MG tablet  Commonly known as: COREG Take 1 tablet (6.25 mg total) by mouth 2 (two) times daily with a meal.   denosumab 60 MG/ML Sosy injection Commonly known as: PROLIA Inject 60 mg into the skin every 6 (six) months.   digoxin 0.125 MG tablet Commonly known as: LANOXIN Take 1 tablet (0.125 mg total) by mouth daily. Start taking on: February 08, 2023   doxycycline 100 MG tablet Commonly known as: VIBRA-TABS Take 1 tablet (100 mg total) by mouth every 12 (twelve) hours for 7 doses.   ferrous sulfate 325 (65 FE) MG tablet Take 325 mg by mouth 2 (two) times daily with a meal.   fluticasone 50 MCG/ACT nasal spray Commonly known as: FLONASE Place 2 sprays into both nostrils daily as needed for allergies or rhinitis.   furosemide 40 MG tablet Commonly known as:  LASIX Take 1 tablet (40 mg total) by mouth 2 (two) times daily.   loratadine 10 MG tablet Commonly known as: CLARITIN Take 10 mg by mouth in the morning.   Melatonin 12 MG Tabs Take 12 mg by mouth at bedtime.   multivitamin with minerals Tabs tablet Take 1 tablet by mouth daily with breakfast.   OXYGEN Inhale 2 L/min into the lungs at bedtime.   pantoprazole 40 MG tablet Commonly known as: PROTONIX Take 1 tablet (40 mg total) by mouth 2 (two) times daily.   predniSONE 10 MG tablet Commonly known as: DELTASONE Take 3 tablets (30 mg total) by mouth daily for 2 days, THEN 2 tablets (20 mg total) daily for 2 days, THEN 1 tablet (10 mg total) daily for 2 days. Start taking on: February 08, 2023 What changed: See the new instructions.   ProAir HFA 108 (90 Base) MCG/ACT inhaler Generic drug: albuterol Inhale 2 puffs into the lungs as needed for wheezing or shortness of breath.   albuterol (2.5 MG/3ML) 0.083% nebulizer solution Commonly known as: PROVENTIL Take 3 mLs (2.5 mg total) by nebulization every 6 (six) hours as needed for shortness of breath or wheezing.   Probiotic Daily Caps Take 1 capsule by mouth in the morning.   sertraline 50 MG tablet Commonly known as: ZOLOFT Take 1 tablet (50 mg total) by mouth at bedtime.   vitamin C 1000 MG tablet Take 1,000 mg by mouth daily.   Vitamin D3 50 MCG (2000 UT) Tabs Generic drug: Cholecalciferol Take 2,000 Units by mouth in the morning.        Follow-up Information     Cox, Kirsten, MD. Schedule an appointment as soon as possible for a visit in 1 week(s).   Specialty: Family Medicine Contact information: [address] [place] [address] [phone]         Jerline Pain, MD Follow up.   Specialty: Cardiology Contact information: [id number] [place] [address] [phone]         Vickie Epley, MD Follow up.   Specialties: Cardiology, Radiology Contact  information: [place] [address] [phone]                Allergies  Allergen Reactions   Tape Other (See Comments)    PLEASE USE COBAN WRAP IN LIEU OF "TAPE," as it "pulls off the skin!!   Daliresp [Roflumilast] Diarrhea and Nausea And Vomiting   Levaquin [Levofloxacin] Palpitations and Other (See Comments)    Made the B/P fluctuate and heart raced   Alendronate Anxiety and Other (See  Comments)    Jittery/nervous    Consultations: None   Procedures/Studies: CT Angio Chest PE W and/or Wo Contrast  Result Date: 02/04/2023 CLINICAL DATA:  Shortness of breath x2 days, bilateral lower extremity swelling, recent hospitalization for AFib, evaluate for PE EXAM: CT ANGIOGRAPHY CHEST WITH CONTRAST TECHNIQUE: Multidetector CT imaging of the chest was performed using the standard protocol during bolus administration of intravenous contrast. Multiplanar CT image reconstructions and MIPs were obtained to evaluate the vascular anatomy. RADIATION DOSE REDUCTION: This exam was performed according to the departmental dose-optimization program which includes automated exposure control, adjustment of the mA and/or kV according to patient size and/or use of iterative reconstruction technique. CONTRAST:  12m OMNIPAQUE IOHEXOL 350 MG/ML SOLN COMPARISON:  CTA chest dated 01/25/2023 FINDINGS: Cardiovascular: Satisfactory opacification the bilateral pulmonary arteries to the segmental level. No evidence of pulmonary embolism. Although not tailored for evaluation of the thoracic aorta, there is no evidence thoracic aortic aneurysm or dissection. Atherosclerotic calcifications of the aortic arch. Moderate three-vessel coronary atherosclerosis. Mediastinum/Nodes: No suspicious mediastinal lymphadenopathy. Visualized thyroid is unremarkable. Lungs/Pleura: Biapical pleural-parenchymal scarring. Moderate centrilobular and paraseptal emphysematous changes, upper lung predominant. No  suspicious pulmonary nodules. No focal consolidation. No pleural effusion or pneumothorax. Upper Abdomen: Visualized upper abdomen is grossly unremarkable, noting vascular calcifications. Musculoskeletal: Moderate to severe compression fracture deformities at T4, T5, T7, and T8, chronic. Prior vertebral augmentation at L1. Review of the MIP images confirms the above findings. IMPRESSION: No evidence of pulmonary embolism. No evidence of acute cardiopulmonary disease. Aortic Atherosclerosis (ICD10-I70.0) and Emphysema (ICD10-J43.9). Electronically Signed   By: SJulian HyM.D.   On: 02/04/2023 11:48   DG Chest 2 View  Result Date: 02/04/2023 CLINICAL DATA:  cp EXAM: CHEST - 2 VIEW COMPARISON:  01/25/2023. FINDINGS: The heart size and mediastinal contours are within normal limits. Both lungs are clear. No pneumothorax or pleural effusion. Aorta is calcified. There are thoracic degenerative changes. IMPRESSION: No active cardiopulmonary disease. Electronically Signed   By: JSammie BenchM.D.   On: 02/04/2023 10:48   VAS UK[place]LOWER EXTREMITY VENOUS (DVT)  Result Date: 01/27/2023  Lower Venous DVT Study Patient Name:  CHENLEY WAHEED Date of Exam:   01/26/2023 Medical Rec #: [id number]      Accession #:    [id number] of Birth: 614-May-1947       Patient Gender: M Patient Age:   89years [facility]      VAS [place]OWER EXTREMITY VENOUS (DVT) Referring Phys: ANyoka LintDOUTOVA --------------------------------------------------------------------------------  Indications: Edema.  Comparison Study: No prior studies. Performing Technologist: RDarlin CocoRDMS, RVT  Examination Guidelines: A complete evaluation includes B-mode imaging, spectral Doppler, color Doppler, and power Doppler as needed of all accessible portions of each vessel. Bilateral testing is considered an integral part of a complete examination. Limited examinations for reoccurring indications may be performed as  noted. The reflux portion of the exam is performed with the patient in reverse Trendelenburg.  +---------+---------------+---------+-----------+----------+--------------+ RIGHT    CompressibilityPhasicitySpontaneityPropertiesThrombus Aging +---------+---------------+---------+-----------+----------+--------------+ CFV      Full           Yes      Yes                                 +---------+---------------+---------+-----------+----------+--------------+ SFJ      Full                                                        +---------+---------------+---------+-----------+----------+--------------+  FV Prox  Full                                                        +---------+---------------+---------+-----------+----------+--------------+ FV Mid   Full                                                        +---------+---------------+---------+-----------+----------+--------------+ FV DistalFull                                                        +---------+---------------+---------+-----------+----------+--------------+ PFV      Full                                                        +---------+---------------+---------+-----------+----------+--------------+ POP      Full           Yes      Yes                                 +---------+---------------+---------+-----------+----------+--------------+ PTV      Full                                                        +---------+---------------+---------+-----------+----------+--------------+ PERO     Full                                                        +---------+---------------+---------+-----------+----------+--------------+   +---------+---------------+---------+-----------+----------+--------------+ LEFT     CompressibilityPhasicitySpontaneityPropertiesThrombus Aging +---------+---------------+---------+-----------+----------+--------------+ CFV      Full            Yes      Yes                                 +---------+---------------+---------+-----------+----------+--------------+ SFJ      Full                                                        +---------+---------------+---------+-----------+----------+--------------+ FV Prox  Full                                                        +---------+---------------+---------+-----------+----------+--------------+  FV Mid   Full                                                        +---------+---------------+---------+-----------+----------+--------------+ FV DistalFull                                                        +---------+---------------+---------+-----------+----------+--------------+ PFV      Full                                                        +---------+---------------+---------+-----------+----------+--------------+ POP      Full           Yes      Yes                                 +---------+---------------+---------+-----------+----------+--------------+ PTV      Full                                                        +---------+---------------+---------+-----------+----------+--------------+ PERO     Full                                                        +---------+---------------+---------+-----------+----------+--------------+     Summary: RIGHT: - There is no evidence of deep vein thrombosis in the lower extremity.  - No cystic structure found in the popliteal fossa.  LEFT: - There is no evidence of deep vein thrombosis in the lower extremity.  - No cystic structure found in the popliteal fossa.  *See table(s) above for measurements and observations. Electronically signed by Servando Snare MD on 01/27/2023 at 2:40:04 PM.    Final    ECHOCARDIOGRAM COMPLETE  Result Date: 01/26/2023    ECHOCARDIOGRAM REPORT   Patient Name:   XAYDEN STALBAUM Date of Exam: 01/26/2023 Medical Rec #:  [id number]      Height:       66.0 in Accession  #:    [id number]     Weight:       161.6 lb Date of Birth:  01/11/1946       BSA:          1.827 m Patient Age:    20 years       BP:           120/50 mmHg Patient Gender: M              HR:           84 bpm. Exam Location:  Inpatient Procedure: 2D Echo, Cardiac Doppler, Color Doppler and Intracardiac  Opacification Agent Indications:    Elevated troponin  History:        Patient has prior history of Echocardiogram examinations, most                 recent 09/13/2021. COPD, Arrythmias:Atrial Fibrillation; Risk                 Factors:Diabetes and Hypertension. 32m Watchman Device                 implanted 08/02/21.  Sonographer:    AClayton LefortRDCS (AE) Referring Phys: AToy Baker Sonographer Comments: Technically difficult study due to poor echo windows. IMPRESSIONS  1. Left ventricular ejection fraction, by estimation, is 65 to 70%. The left ventricle has normal function. The left ventricle has no regional wall motion abnormalities. Left ventricular diastolic parameters are consistent with Grade I diastolic dysfunction (impaired relaxation).  2. Right ventricular systolic function is low normal. The right ventricular size is normal. The estimated right ventricular systolic pressure is [id number].  3. The mitral valve is grossly normal. Trivial mitral valve regurgitation.  4. The aortic valve is tricuspid. Aortic valve regurgitation is trivial. Aortic valve sclerosis is present, with no evidence of aortic valve stenosis.  5. The inferior vena cava is normal in size with greater than 50% respiratory variability, suggesting right atrial pressure of 3 mmHg. Comparison(s): Changes from prior study are noted. 09/13/2021: LVEF 60-65%. FINDINGS  Left Ventricle: Left ventricular ejection fraction, by estimation, is 65 to 70%. The left ventricle has normal function. The left ventricle has no regional wall motion abnormalities. Definity contrast agent was given IV to delineate the left ventricular  endocardial  borders. The left ventricular internal cavity size was normal in size. There is no left ventricular hypertrophy. Left ventricular diastolic parameters are consistent with Grade I diastolic dysfunction (impaired relaxation). Indeterminate filling pressures. Right Ventricle: The right ventricular size is normal. No increase in right ventricular wall thickness. Right ventricular systolic function is low normal. The tricuspid regurgitant velocity is 2.04 m/s, and with an assumed right atrial pressure of 3 mmHg, the estimated right ventricular systolic pressure is [id number]. Left Atrium: Left atrial size was normal in size. Right Atrium: Right atrial size was normal in size. Pericardium: There is no evidence of pericardial effusion. Mitral Valve: The mitral valve is grossly normal. Trivial mitral valve regurgitation. Tricuspid Valve: The tricuspid valve is grossly normal. Tricuspid valve regurgitation is trivial. Aortic Valve: The aortic valve is tricuspid. Aortic valve regurgitation is trivial. Aortic valve sclerosis is present, with no evidence of aortic valve stenosis. Aortic valve mean gradient measures 1.0 mmHg. Aortic valve peak gradient measures 2.6 mmHg. Aortic valve area, by VTI measures 2.83 cm. Pulmonic Valve: The pulmonic valve was normal in structure. Pulmonic valve regurgitation is not visualized. Aorta: The aortic root and ascending aorta are structurally normal, with no evidence of dilitation. Venous: The inferior vena cava is normal in size with greater than 50% respiratory variability, suggesting right atrial pressure of 3 mmHg. IAS/Shunts: No atrial level shunt detected by color flow Doppler.  LEFT VENTRICLE PLAX 2D LVIDd:         4.20 cm   Diastology LVIDs:         2.60 cm   LV e' medial:    5.87 cm/s LV PW:         1.10 cm   LV E/e' medial:  10.4 LV IVS:        1.00 cm  LV e' lateral:   8.49 cm/s LVOT diam:     2.20 cm   LV E/e' lateral: 7.2 LV SV:         45 LV SV Index:   25 LVOT Area:     3.80  cm  RIGHT VENTRICLE             IVC RV Basal diam:  2.60 cm     IVC diam: 1.60 cm RV S prime:     10.20 cm/s TAPSE (M-mode): 2.0 cm LEFT ATRIUM           Index        RIGHT ATRIUM           Index LA diam:      3.00 cm 1.64 cm/m   RA Area:     11.40 cm LA Vol (A2C): 29.7 ml 16.26 ml/m  RA Volume:   24.40 ml  13.36 ml/m LA Vol (A4C): 36.7 ml 20.09 ml/m  AORTIC VALVE AV Area (Vmax):    3.37 cm AV Area (Vmean):   3.08 cm AV Area (VTI):     2.83 cm AV Vmax:           80.40 cm/s AV Vmean:          52.500 cm/s AV VTI:            0.160 m AV Peak Grad:      2.6 mmHg AV Mean Grad:      1.0 mmHg LVOT Vmax:         71.30 cm/s LVOT Vmean:        42.600 cm/s LVOT VTI:          0.119 m LVOT/AV VTI ratio: 0.74  AORTA Ao Root diam: 3.20 cm Ao Asc diam:  2.80 cm MITRAL VALVE               TRICUSPID VALVE MV Area (PHT): 3.66 cm    TR Peak grad:   16.6 mmHg MV Decel Time: 207 msec    TR Vmax:        204.00 cm/s MV E velocity: 61.30 cm/s MV A velocity: 63.20 cm/s  SHUNTS MV E/A ratio:  0.97        Systemic VTI:  0.12 m                            Systemic Diam: 2.20 cm Lyman Bishop MD Electronically signed by Lyman Bishop MD Signature Date/Time: 01/26/2023/4:02:20 PM    Final    CT Angio Chest Pulmonary Embolism (PE) W or WO Contrast  Result Date: 01/25/2023 CLINICAL DATA:  Pulmonary embolism (PE) suspected, high prob EXAM: CT ANGIOGRAPHY CHEST WITH CONTRAST TECHNIQUE: Multidetector CT imaging of the chest was performed using the standard protocol during bolus administration of intravenous contrast. Multiplanar CT image reconstructions and MIPs were obtained to evaluate the vascular anatomy. RADIATION DOSE REDUCTION: This exam was performed according to the departmental dose-optimization program which includes automated exposure control, adjustment of the mA and/or kV according to patient size and/or use of iterative reconstruction technique. CONTRAST:  77m OMNIPAQUE IOHEXOL 350 MG/ML SOLN COMPARISON:  07/17/2021 FINDINGS:  Cardiovascular: No filling defects in the pulmonary arteries to suggest pulmonary emboli. Heart is normal size. Aorta is normal caliber. Scattered coronary artery and aortic calcifications. Mediastinum/Nodes: No mediastinal, hilar, or axillary adenopathy. Trachea and esophagus are unremarkable. Thyroid unremarkable. Lungs/Pleura: Centrilobular emphysema. No confluent opacities or effusions. Upper Abdomen:  No acute findings Musculoskeletal: Chest wall soft tissues are unremarkable. No acute bony abnormality. Review of the MIP images confirms the above findings. IMPRESSION: No evidence of pulmonary embolus. Coronary artery disease. Aortic Atherosclerosis (ICD10-I70.0) and Emphysema (ICD10-J43.9). Electronically Signed   By: Rolm Baptise M.D.   On: 01/25/2023 20:33   DG Chest Portable 1 View  Result Date: 01/25/2023 CLINICAL DATA:  Shortness of breath and chest pain. EXAM: PORTABLE CHEST 1 VIEW COMPARISON:  January 16, 2023 FINDINGS: Cardiomediastinal silhouette is normal. Mediastinal contours appear intact. There is no evidence of focal airspace consolidation, pleural effusion or pneumothorax. Osseous structures are without acute abnormality. Soft tissues are grossly normal. IMPRESSION: No active disease. Electronically Signed   By: Fidela Salisbury M.D.   On: 01/25/2023 15:44   DG Chest Port 1 View  Result Date: 01/16/2023 CLINICAL DATA:  Shortness of breath, history COPD, worsening symptoms since beginning of January following the flu EXAM: PORTABLE CHEST 1 VIEW COMPARISON:  Portable exam 1130 hours compared to 07/16/2022 FINDINGS: Normal heart size, mediastinal contours, and pulmonary vascularity. Atherosclerotic calcification aorta. Lungs emphysematous but clear. No pulmonary infiltrate, pleural effusion, or pneumothorax. Bones demineralized. IMPRESSION: Emphysematous changes without acute infiltrate. Aortic Atherosclerosis (ICD10-I70.0) and Emphysema (ICD10-J43.9). Electronically Signed   By: Lavonia Dana M.D.   On: 01/16/2023 12:00     Subjective: Patient seen examined bedside, resting,.  Lying in bed.  No specific complaints this morning.  Was titrated off oxygen yesterday.  Feels overall well and ready for discharge home.  States he has an upcoming appointment with electrophysiology for discussion of ablation therapy for his A-fib with RVR.  Currently on telemetry he has converted to normal sinus rhythm.  Discussed with patient to maintain daily weight log to bring to next PCP and cardiology visit.  No other questions or concerns at this time.  Denies headache, no dizziness, no chest pain, no palpitations, no shortness of breath, no abdominal pain, no focal weakness, no fatigue, no fever/chills/night sweats, no nausea/vomiting/diarrhea, no paresthesias.  No acute events overnight per nursing staff.  Discharge Exam: Vitals:   02/07/23 0723 02/07/23 0840  BP: (!) 142/63   Pulse: 78 78  Resp: 16 18  Temp: 97.8 F (36.6 C)   SpO2: 99% 99%   Vitals:   02/07/23 0406 02/07/23 0500 02/07/23 0723 02/07/23 0840  BP: (!) 148/80  (!) 142/63   Pulse: 77  78 78  Resp: 20  16 18  $ Temp: 97.8 F (36.6 C)  97.8 F (36.6 C)   TempSrc: Oral  Oral   SpO2: 99%  99% 99%  Weight:  70.3 kg    Height:        Physical Exam: GEN: NAD, alert and oriented x 3, elderly in appearance HEENT: NCAT, PERRL, EOMI, sclera clear, MMM PULM: CTAB w/o wheezes/crackles, normal respiratory effort, currently on room air with SpO2 99% at rest (baseline utilizes 2 L per nasal cannula) CV: RRR w/o M/G/R GI: abd soft, NTND, NABS, no R/G/M MSK: no peripheral edema, muscle strength globally intact 5/5 bilateral upper/lower extremities NEURO: CN II-XII intact, no focal deficits, sensation to light touch intact PSYCH: normal mood/affect Integumentary: Bilateral shin abrasions noted with dressings in place, clean/dry/intact, no other concerning rashes/lesions/wounds noted exposed skin surfaces.    The results of  significant diagnostics from this hospitalization (including imaging, microbiology, ancillary and laboratory) are listed below for reference.     Microbiology: Recent Results (from the past 240 hour(s))  Resp panel by RT-PCR (RSV,  Flu A&B, Covid) Anterior Nasal Swab     Status: None   Collection Time: 02/04/23 10:19 AM   Specimen: Anterior Nasal Swab  Result Value Ref Range Status   SARS Coronavirus 2 by RT PCR NEGATIVE NEGATIVE Final    Comment: (NOTE) SARS-CoV-2 target nucleic acids are NOT DETECTED.  The SARS-CoV-2 RNA is generally detectable in upper respiratory specimens during the acute phase of infection. The lowest concentration of SARS-CoV-2 viral copies this assay can detect is 138 copies/mL. A negative result does not preclude SARS-Cov-2 infection and should not be used as the sole basis for treatment or other patient management decisions. A negative result may occur with  improper specimen collection/handling, submission of specimen other than nasopharyngeal swab, presence of viral mutation(s) within the areas targeted by this assay, and inadequate number of viral copies(<138 copies/mL). A negative result must be combined with clinical observations, patient history, and epidemiological information. The expected result is Negative.  Fact Sheet for Patients:  EntrepreneurPulse.com.au  Fact Sheet for Healthcare Providers:  IncredibleEmployment.be  This test is no t yet approved or cleared by the [place] FDA and  has been authorized for detection and/or diagnosis of SARS-CoV-2 by FDA under an Emergency Use Authorization (EUA). This EUA will remain  in effect (meaning this test can be used) for the duration of the COVID-19 declaration under Section 564(b)(1) of the Act, 21 U.S.C.section 360bbb-3(b)(1), unless the authorization is terminated  or revoked sooner.       Influenza A by PCR NEGATIVE NEGATIVE Final   Influenza B by  PCR NEGATIVE NEGATIVE Final    Comment: (NOTE) The Xpert Xpress SARS-CoV-2/FLU/RSV plus assay is intended as an aid in the diagnosis of influenza from Nasopharyngeal swab specimens and should not be used as a sole basis for treatment. Nasal washings and aspirates are unacceptable for Xpert Xpress SARS-CoV-2/FLU/RSV testing.  Fact Sheet for Patients: EntrepreneurPulse.com.au  Fact Sheet for Healthcare Providers: IncredibleEmployment.be  This test is not yet approved or cleared by the [place] FDA and has been authorized for detection and/or diagnosis of SARS-CoV-2 by FDA under an Emergency Use Authorization (EUA). This EUA will remain in effect (meaning this test can be used) for the duration of the COVID-19 declaration under Section 564(b)(1) of the Act, 21 U.S.C. section 360bbb-3(b)(1), unless the authorization is terminated or revoked.     Resp Syncytial Virus by PCR NEGATIVE NEGATIVE Final    Comment: (NOTE) Fact Sheet for Patients: EntrepreneurPulse.com.au  Fact Sheet for Healthcare Providers: IncredibleEmployment.be  This test is not yet approved or cleared by the [place] FDA and has been authorized for detection and/or diagnosis of SARS-CoV-2 by FDA under an Emergency Use Authorization (EUA). This EUA will remain in effect (meaning this test can be used) for the duration of the COVID-19 declaration under Section 564(b)(1) of the Act, 21 U.S.C. section 360bbb-3(b)(1), unless the authorization is terminated or revoked.  Performed at [facility], [address] [place], [place] [address]      Labs: BNP (last 3 results) Recent Labs    07/16/22 1320 01/16/23 1134 02/04/23 1019  BNP 48.8 66.6 [id number]   Basic Metabolic Panel: Recent Labs  Lab 02/04/23 1019 02/04/23 1328 02/05/23 0025 02/06/23 0358 02/07/23 0420  NA 137 136 138 133* 138  K 4.1 4.4 4.8 3.5 4.6  CL 92*   --  93* 91* 93*  CO2 35*  --  33* 30 36*  GLUCOSE 152*  --  100* 181* 103*  BUN 14  --  19 24* 24*  CREATININE 0.76  --  0.97 0.85 1.01  CALCIUM 9.1  --  8.9 8.3* 8.9  MG  --   --   --   --  2.0   Liver Function Tests: Recent Labs  Lab 02/04/23 1019  AST 32  ALT 42  ALKPHOS 69  BILITOT 0.6  PROT 6.6  ALBUMIN 3.6   No results for input(s): "LIPASE", "AMYLASE" in the last 168 hours. No results for input(s): "AMMONIA" in the last 168 hours. CBC: Recent Labs  Lab 02/04/23 1019 02/04/23 1328 02/07/23 0420  WBC 13.8*  --  12.0*  NEUTROABS 12.4*  --   --   HGB 12.4* 12.6* 12.7*  HCT 40.4 37.0* 38.7*  MCV 83.6  --  81.3  PLT 230  --  223   Cardiac Enzymes: No results for input(s): "CKTOTAL", "CKMB", "CKMBINDEX", "TROPONINI" in the last 168 hours. BNP: Invalid input(s): "POCBNP" CBG: Recent Labs  Lab 02/06/23 0801 02/06/23 1154 02/06/23 1643 02/06/23 2141 02/07/23 0757  GLUCAP 106* 143* 168* 130* 129*   D-Dimer No results for input(s): "DDIMER" in the last 72 hours. Hgb A1c No results for input(s): "HGBA1C" in the last 72 hours. Lipid Profile No results for input(s): "CHOL", "HDL", "LDLCALC", "TRIG", "CHOLHDL", "LDLDIRECT" in the last 72 hours. Thyroid function studies No results for input(s): "TSH", "T4TOTAL", "T3FREE", "THYROIDAB" in the last 72 hours.  Invalid input(s): "FREET3" Anemia work up No results for input(s): "VITAMINB12", "FOLATE", "FERRITIN", "TIBC", "IRON", "RETICCTPCT" in the last 72 hours. Urinalysis    Component Value Date/Time   COLORURINE YELLOW 01/25/2023 2000   APPEARANCEUR CLEAR 01/25/2023 2000   LABSPEC 1.013 01/25/2023 2000   PHURINE 6.0 01/25/2023 2000   GLUCOSEU NEGATIVE 01/25/2023 2000   HGBUR NEGATIVE 01/25/2023 2000   BILIRUBINUR NEGATIVE 01/25/2023 2000   BILIRUBINUR Negative 05/10/2021 [place] 01/25/2023 2000   PROTEINUR NEGATIVE 01/25/2023 2000   UROBILINOGEN negative (A) 05/10/2021 0931   NITRITE  NEGATIVE 01/25/2023 2000   LEUKOCYTESUR NEGATIVE 01/25/2023 2000   Sepsis Labs Recent Labs  Lab 02/04/23 1019 02/07/23 0420  WBC 13.8* 12.0*   Microbiology Recent Results (from the past 240 hour(s))  Resp panel by RT-PCR (RSV, Flu A&B, Covid) Anterior Nasal Swab     Status: None   Collection Time: 02/04/23 10:19 AM   Specimen: Anterior Nasal Swab  Result Value Ref Range Status   SARS Coronavirus 2 by RT PCR NEGATIVE NEGATIVE Final    Comment: (NOTE) SARS-CoV-2 target nucleic acids are NOT DETECTED.  The SARS-CoV-2 RNA is generally detectable in upper respiratory specimens during the acute phase of infection. The lowest concentration of SARS-CoV-2 viral copies this assay can detect is 138 copies/mL. A negative result does not preclude SARS-Cov-2 infection and should not be used as the sole basis for treatment or other patient management decisions. A negative result may occur with  improper specimen collection/handling, submission of specimen other than nasopharyngeal swab, presence of viral mutation(s) within the areas targeted by this assay, and inadequate number of viral copies(<138 copies/mL). A negative result must be combined with clinical observations, patient history, and epidemiological information. The expected result is Negative.  Fact Sheet for Patients:  EntrepreneurPulse.com.au  Fact Sheet for Healthcare Providers:  IncredibleEmployment.be  This test is no t yet approved or cleared by the [place] FDA and  has been authorized for detection and/or diagnosis of SARS-CoV-2 by FDA under an Emergency Use Authorization (EUA). This EUA  will remain  in effect (meaning this test can be used) for the duration of the COVID-19 declaration under Section 564(b)(1) of the Act, 21 U.S.C.section 360bbb-3(b)(1), unless the authorization is terminated  or revoked sooner.       Influenza A by PCR NEGATIVE NEGATIVE Final   Influenza B  by PCR NEGATIVE NEGATIVE Final    Comment: (NOTE) The Xpert Xpress SARS-CoV-2/FLU/RSV plus assay is intended as an aid in the diagnosis of influenza from Nasopharyngeal swab specimens and should not be used as a sole basis for treatment. Nasal washings and aspirates are unacceptable for Xpert Xpress SARS-CoV-2/FLU/RSV testing.  Fact Sheet for Patients: EntrepreneurPulse.com.au  Fact Sheet for Healthcare Providers: IncredibleEmployment.be  This test is not yet approved or cleared by the [place] FDA and has been authorized for detection and/or diagnosis of SARS-CoV-2 by FDA under an Emergency Use Authorization (EUA). This EUA will remain in effect (meaning this test can be used) for the duration of the COVID-19 declaration under Section 564(b)(1) of the Act, 21 U.S.C. section 360bbb-3(b)(1), unless the authorization is terminated or revoked.     Resp Syncytial Virus by PCR NEGATIVE NEGATIVE Final    Comment: (NOTE) Fact Sheet for Patients: EntrepreneurPulse.com.au  Fact Sheet for Healthcare Providers: IncredibleEmployment.be  This test is not yet approved or cleared by the [place] FDA and has been authorized for detection and/or diagnosis of SARS-CoV-2 by FDA under an Emergency Use Authorization (EUA). This EUA will remain in effect (meaning this test can be used) for the duration of the COVID-19 declaration under Section 564(b)(1) of the Act, 21 U.S.C. section 360bbb-3(b)(1), unless the authorization is terminated or revoked.  Performed at [facility], [address] [place], [place] [address]      Time coordinating discharge: Over 30 minutes  SIGNED:   Donnamarie Poag [place] DO  [facility] 02/07/2023, 10:36 AM

## 2023-02-07 NOTE — Progress Notes (Signed)
Discharge instructions reviewed with pt.  Copy of instructions given to pt. Pt informed scripts sent to pt's pharmacy for pick up. CHF education, low sodium diet , daily weights reviewed with pt, handouts provided. Pt verbalized understanding and states he plans to record weight and blood pressure daily to take to MD office.  Pt d/c'd via wheelchair with belongings, with wife at the main entrance.          Escorted by unit staff.

## 2023-02-09 NOTE — Progress Notes (Unsigned)
Paul [name], male    DOB: July 20, 1946,    MRN: [id number]   Brief patient profile:  54 yowm MM/Quit smoking  02/03/09  with GOLD III spirometry baseline doe x able to golf. Only using 02 at  [facility] with  maint on symbicort/ spiriva and no chronic pred/ albuterol twice daily at most  much worse mid March 2020.   PFT 05/29/16: FVC 2.08 L (86%) FEV1 0.83 L (31%) FEV1/FVC 0.40 FEF 25-75 0.30 L (14%) no bronchodilator response TLC 6.56 L (107%) RV 167% ERV 160% DLCO corrected 60% (hemoglobin 10.9) 07/15/13: FVC 2.68 L (60%) FEV1 1.63 L (53%) FEV1/FVC 0.61 FEF 25-75 0.76 L (26%) 02/04/12: FVC 2.18 L (57%) FEV1 1.09 L (36%) FEV1/FVC 0.50 FEF 25-75 0.33 L (11%)   Admit date: 03/19/2019 Discharge date: 03/25/2019   Recommendations for Outpatient Follow-up:  F/u with PCP within a week  for hospital discharge follow up, repeat cbc/bmp at follow up F/u with cardiology for new diagnosis of afib Home 02   Discharge Diagnoses:      Active Hospital Problems    Diagnosis Date Noted   PAF (paroxysmal atrial fibrillation) (HCC)     Acute on chronic respiratory failure with hypoxia (HCC)     COPD exacerbation ([place]) 03/19/2019         Filed Weights    03/23/19 1700 03/24/19 0503 03/25/19 0528  Weight: 60.2 kg 60 kg 62.3 kg      History of present illness: (per admitting MD Dr Earnest Conroy) PCP: [name], MD  Patient coming from: Home   Chief Complaint: Worsening shortness of breath   HPI: Paul [name] is a 77 y.o. male with history h/o COPD, hyperlipidemia who at baseline was using 2 L O2 via nasal cannula only at night and follows [place] pul[facility] (Dr. Vaughan Browner) as outpatient presents with worsening shortness of breath.  Patient reports that at baseline he has cough with white phlegm and is able to walk around doing his ADLs without shortness of breath and uses O2 only at night.  2 weeks back he developed productive cough with green phlegm and shortness of breath but no fevers.  He underwent CT  chest with contrast on March 18 which was negative for PE or infiltrates.  He apparently was given doxycycline/prednisone course at that time which helped him transiently but symptoms worsened again past Monday with shortness of breath on minimum exertion/cough and he noticed his pulse ox was dropping to 74% .  He started using 2 L O2 nasal cannula continuous.  On Tuesday, he called pulmonary office given persistent symptoms and also pulse oximetry showing O2 sats 84% on exertion and 93% at rest in spite of using continuous O2.  He was advised by Dr. Vaughan Browner to use 60 mg prednisone x3 days followed by 10 mg taper every 3 days.  He was also advised to use nebulizer treatments every 3 hours and continue Symbicort inhaler twice daily.  Patient was supposed to start 50 mg prednisone today but woke up feeling extremely short of breath.  He states he is also been having bad bouts of cough at night with wheezing and bronchospasm.   He presented to [place] ED where he was noted to be afebrile but hypoxic, required BiPAP for 3 hours, chest x-ray was negative for infiltrates.  Patient denies any recent history of travel or known sick contacts with flu positive or coronavirus positive patients.  He lives at home with his wife who is recovering  from lung cancer and completed chemotherapy 1 month back.   Hospitalist service was called to request direct admission to medical floor but ED to ED transfer facilitated for thorough clinical evaluation prior to requesting medical bed given coronavirus pandemic.  In the ED at [facility], [place], patient has been saturating well on 3 L nasal cannula but appears tachypneic on talking full sentences.  He denies any chest pain, denies any fevers or leg swellings.   Hospital Course:  Active Problems:   COPD exacerbation (HCC)   PAF (paroxysmal atrial fibrillation) (HCC)   Acute on chronic respiratory failure with hypoxia (HCC)     Acute on Hypoxic respiratory failure, COPD  exacerbation, Progression of severe COPD -Emphysema, with no evidence of superimposed acute cardiopulmonary disease -improving on iv steroids, oral zitrhomax, he finished total of 5 days of zithromax in the hospital, he is discharged on slow prednisone taper.  - o2 dropped to 86% while ambulating on room air, discharged on continuous home o2 ( previously on nighttime o2 only) -he is to follow with Dr Vaughan Browner    Paroxsymal afib, New diagnosis of afib -he is started on eliquis (CHADS-VASC 2, age and aortic atherosclerosis), cardizem, lasix -cardiology consulted recommend "Diltiazem CD 360. Avoid amiodarone and beta blockers given his underlying lung dz." -outpatient echo per cardiology     Aortic and multivessel coronary atherosclerosis  - Changed to high intensity statin, atorvastatin 57m PO QD. Check lipids and ALT as outpatient in 3 months.   - ASA 81. Aggressive secondary prevention.  - No chest pain.         History of Present Illness  03/29/2019  Pulmonary/ extended post hosp f/u eval/Nicholes Hibler  Re GOLD III copd ? 02 dep now Chief Complaint  Patient presents with   Hospitalization Follow-up    Breathing has improved some. He has not used his albuterol inhaler or neb since hospital d/c.  Dyspnea:  mb and back ok off 02 sats upper 80s at end  Cough: none Sleep: recliner x 45 degrees = baseline 02 2lpm hs  SABA use: none  Since d/c on symb 160 2bid and spiriva each pm (technique 50% on both, see a/p) On pred 50 mg daily on day as ov rec Plan A = Automatic = symbicort 160 Take 2 puffs first thing in am and then another 2 puffs about 12 hours later.  Spiriva x 2 puffs  right after symbicort  Work on inhaler technique: Plan B = Backup Only use your albuterol inhaler(Proventil/Proair)  as a rescue medication Plan C = Crisis - only use your albuterol nebulizer if you first try Plan B and it fails to help > ok to use the nebulizer up to every 4 hours but if start needing it regularly call  for immediate appointment Plan D = Doctor - call me if B and C not adequate Plan E = ER - go to ER or call 911 if all else fails   02  2lpm at bedtime and adjust to keep it over 90% with activity       05/29/2021  f/u ov/Nohlan Burdin re:  GOLD III  breztri  And prednisone at 10 mg  X weeks s flare  Chief Complaint  Patient presents with   Follow-up    Breathing is doing well overall. He uses his albuterol inhaler 4-5 x per wk. He rarely uses neb.    Dyspnea:  Walking 2.8 mph ,mostly flat  x 30 min s stopping / 02 sats on 2lpm  but will go  Cough: none  Sleeping: wedge 30 degrees SABA use: as above/ mostly hfa  02: 2lpm hs/ none needed at rest  Covid status:   X  vax 3  Rec Try prednisone 10 mg alternating with 5 mg days No change in any of your other medication  We can refer you now for a best fit for portable 02 concentrator  Rec  Try prednisone 10 mg alternating with 5 mg days No change in any of your other medication  We can refer you now for a best fit for portable 02 concentrator       03/19/2022  f/u ov/Ancil Dewan re: GOLD 3  maint on Breztri and prednisone 12.5  mg daily  Chief Complaint  Patient presents with   Follow-up    Pt states he has been doing okay since last visit and denies any complaints.   Dyspnea:  now back walking treadmill 10-15 min flat and slow  Cough: none  Sleeping: wedge x 30 degrees  SABA use: rare hfa and weaned down to 0  3 days prior  02: 2lpm hs and prn daytime Covid status:   vax max  Rec Plan A = Automatic = Always=    Breztri Take 2 puffs first thing in am and then another 2 puffs about 12 hours later.  Work on inhaler technique:   Plan B = Backup (to supplement plan A, not to replace it) Only use your albuterol inhaler as a rescue medication  Plan C = Crisis (instead of Plan B but only if Plan B stops working) - only use your albuterol nebulizer if you first try Plan B  Plan D = Deltasone = prednisone  - double prednisone dose until better then  taper back to where your were   02/10/2023  f/u ov/Yatziry Deakins re: ***   maint on ***  No chief complaint on file.   Dyspnea:  *** Cough: *** Sleeping: *** SABA use: *** 02: *** Covid status:   *** Lung cancer screening :  ***    No obvious day to day or daytime variability or assoc excess/ purulent sputum or mucus plugs or hemoptysis or cp or chest tightness, subjective wheeze or overt sinus or hb symptoms.   *** without nocturnal  or early am exacerbation  of respiratory  c/o's or need for noct saba. Also denies any obvious fluctuation of symptoms with weather or environmental changes or other aggravating or alleviating factors except as outlined above   No unusual exposure hx or h/o childhood pna/ asthma or knowledge of premature birth.  Current Allergies, Complete Past Medical History, Past Surgical History, Family History, and Social History were reviewed in [employer] record.  ROS  The following are not active complaints unless bolded Hoarseness, sore throat, dysphagia, dental problems, itching, sneezing,  nasal congestion or discharge of excess mucus or purulent secretions, ear ache,   fever, chills, sweats, unintended wt loss or wt gain, classically pleuritic or exertional cp,  orthopnea pnd or arm/hand swelling  or leg swelling, presyncope, palpitations, abdominal pain, anorexia, nausea, vomiting, diarrhea  or change in bowel habits or change in bladder habits, change in stools or change in urine, dysuria, hematuria,  rash, arthralgias, visual complaints, headache, numbness, weakness or ataxia or problems with walking or coordination,  change in mood or  memory.        No outpatient medications have been marked as taking for the 02/10/23 encounter (Appointment) with Tanda Rockers, MD.  Objective:    Wts  02/10/2023        ***  03/19/2022       152  05/29/2021          155 02/19/2021        151  10/03/2020      155  02/24/2020         137   01/27/2020         144 10/26/2019        156  07/26/2019          144   06/15/19 136 lb (61.7 kg)  05/29/19 134 lb 7.7 oz (61 kg)  05/28/19 134 lb 3.2 oz (60.9 kg)    Vital signs reviewed  02/10/2023  - Note at rest 02 sats  ***% on ***   General appearance:    ***     Mod bar***          Assessment

## 2023-02-10 ENCOUNTER — Telehealth: Payer: Self-pay | Admitting: *Deleted

## 2023-02-10 ENCOUNTER — Ambulatory Visit (INDEPENDENT_AMBULATORY_CARE_PROVIDER_SITE_OTHER): Payer: Medicare Other | Admitting: Internal Medicine

## 2023-02-10 ENCOUNTER — Encounter: Payer: Self-pay | Admitting: Internal Medicine

## 2023-02-10 ENCOUNTER — Encounter: Payer: Self-pay | Admitting: *Deleted

## 2023-02-10 VITALS — BP 158/70 | HR 81 | Temp 97.7°F | Ht 66.0 in | Wt 157.0 lb

## 2023-02-10 DIAGNOSIS — J449 Chronic obstructive pulmonary disease, unspecified: Secondary | ICD-10-CM | POA: Diagnosis not present

## 2023-02-10 DIAGNOSIS — J9611 Chronic respiratory failure with hypoxia: Secondary | ICD-10-CM | POA: Diagnosis not present

## 2023-02-10 NOTE — Patient Outreach (Signed)
  Care Coordination   Post- [facility] Care Coordination telephone  Visit Note   02/10/2023 Name: Paul [name] MRN: [id number] DOB: [birth year]  Paul [name] is a 77 y.o. year old male who sees Cox, Kirsten, MD for primary care. I spoke with  [name] by phone today.  What matters to the patients health and wellness today?  "I just wanted to call you back and apologize that I was so short with you earlier today when we spoke.  I am sorry.  I also wanted to ask you to go ahead and schedule a phone call with the nurse you were telling me about when we talked earlier-- I do want to schedule a call with her, because I just found out that my palliative care team is not involved in my care any more.  I have spoken with Estill Bamberg before, and I would like for you to schedule a call with  her.  I really want to do everything I can to stay out of the hospital, and I appreciate you doing this."  SDOH assessments and interventions completed:  Yes Completed earlier today during TOC call  Interventions Today    Flowsheet Row Most Recent Value  General Interventions   General Interventions Discussed/Reviewed Doctor Visits, Referral to Nurse       Care Coordination Interventions:  Yes, provided   Follow up plan: Follow up call scheduled for RN CM Care Coordinator on Monday 02/17/23 at 11:00 am    Encounter Outcome:  Pt. Visit Completed   Oneta Rack, RN, BSN, CCRN Alumnus RN CM Care Coordination/ [facility] [place] [facility] [phone]: direct office

## 2023-02-10 NOTE — Patient Instructions (Signed)
No change in medications  Make sure you check your oxygen saturation  AT  your highest level of activity (not after you stop)   to be sure it stays over 90% and adjust  02 flow upward to maintain this level if needed but remember to turn it back to previous settings when you stop (to conserve your supply).    Please schedule a follow up visit in 3 months but call sooner if needed

## 2023-02-10 NOTE — Transitions of Care (Post Inpatient/ED Visit) (Signed)
02/10/2023  Name: Paul [name] MRN: [id number] DOB: 11/15/1946  Today's TOC FU Call Status: Today's TOC FU Call Status:: Successful TOC FU Call Competed TOC FU Call Complete Date: 02/10/23  Transition Care Management Follow-up Telephone Call Date of Discharge: 02/07/23 Discharge Facility: [facility]) Type of Discharge: Inpatient Admission Primary Inpatient Discharge Diagnosis:: COPD/ hypoxia; A-Fib with RVR How have you been since you were released from the hospital?: Better Any questions or concerns?: Yes Patient Questions/Concerns:: "I am doing fine, back to my normal self.  The only thing I have noticed is that I am sometimes feeling light-headed when I stand up since they put me on these new medications; I talked to Dr. Melvyn Novas about it this morning and I will also talk to my heart doctor about it tomorrow and to Dr. Tobie Poet on Wednesday; I understand your concern, but I just do NOT want any nurse calling me on the phone-- I have my palliative care nurse who comes to my home every coiuple of weeks and that is the ONLY nurse I want to talk to on a regular basis" Patient Questions/Concerns Addressed: Other: (interventions as documented below)  Patient adamantly declined my offer to scheduled ongoing care coordination telephone visit with RN CM: reports he has palliative care nurse visits every 3-4 weeks, states he does NOT want any additional follow up and verbalizes understanding to promptly contact care provider team for any new questions/ needs/ concerns; he also declines taking my phone number down should needs arise in the future  Items Reviewed: Did you receive and understand the discharge instructions provided?: Yes Medications obtained and verified?: Yes (Medications Reviewed) (verified patient reports he obtained and is taking all newly prescribed medications post-hospital discharge; he adamantly refuses full medication review and denies questions/ concerns around medications today;  reports self-manages medications) Any new allergies since your discharge?: No Dietary orders reviewed?: Yes Type of Diet Ordered:: heart healthy. low salt Do you have support at home?: Yes People in Home: spouse Name of Support/Comfort Primary Source: patient reports he is independent in self-care activities; wife assists as/ if indicated  Home Care and Equipment/Supplies: Were [place] Ordered?: No Any new equipment or medical supplies ordered?: No  Functional Questionnaire: Do you need assistance with bathing/showering or dressing?: No Do you need assistance with meal preparation?: No Do you need assistance with eating?: No Do you have difficulty maintaining continence: No Do you need assistance with getting out of bed/getting out of a chair/moving?: No Do you have difficulty managing or taking your medications?: No  Folllow up appointments reviewed: PCP Follow-up appointment confirmed?: Yes Date of PCP follow-up appointment?: 02/12/23 Follow-up Provider: PCP [facility] Follow-up appointment confirmed?: Yes Date of Specialist follow-up appointment?: 02/11/23 Follow-Up Specialty Provider:: cardiology; verified patient also attended pulmonary provider office visit this morning as scheduled Do you need transportation to your follow-up appointment?: No Do you understand care options if your condition(s) worsen?: Yes-patient verbalized understanding  SDOH Interventions Today    Flowsheet Row Most Recent Value  SDOH Interventions   Food Insecurity Interventions Intervention Not Indicated  Transportation Interventions Intervention Not Indicated  [drives self]      TOC Interventions Today    Flowsheet Row Most Recent Value  TOC Interventions   TOC Interventions Discussed/Reviewed TOC Interventions Discussed      Interventions Today    Flowsheet Row Most Recent Value  Chronic Disease   Chronic disease during today's visit Atrial Fibrillation (AFib),  Chronic Obstructive Pulmonary Disease (COPD)  General Interventions   General Interventions Discussed/Reviewed General Interventions Discussed, Doctor Visits  Doctor Visits Discussed/Reviewed Doctor Visits Discussed, Doctor Visits Reviewed, PCP, Specialist  PCP/Specialist Visits Compliance with follow-up visit  Education Interventions   Education Provided Provided Education  [action plan for dizziness/ lightheadedness,  need to discuss signs/ symptoms with care providers,  importance of completing medication review with care providers at time of upcoming scheduled provider appointments]  Nutrition Interventions   Nutrition Discussed/Reviewed Nutrition Discussed  Pharmacy Interventions   Pharmacy Dicussed/Reviewed Pharmacy Topics Discussed  [declined full medication review]      Oneta Rack, RN, BSN, CCRN Alumnus RN CM Care Coordination/ Transition of [place] [facility] [phone]: direct office

## 2023-02-10 NOTE — Patient Outreach (Signed)
  Care Coordination   Multidisciplinary Case Review Note    02/10/2023 Name: Paul [name] MRN: [id number] DOB: Jul 01, 1946  Paul [name] is a 77 y.o. year old male who sees Cox, Kirsten, MD for primary care.  The  multidisciplinary care team met today to review patient care needs and barriers.    Multidisciplinary case conference completed due to multiple recent hospitalizations, most within 30-days of readmission; patient adamantly declined ongoing care coordination outreach/ follow up during [facility] call today, stating that he has established palliative care in community setting  SDOH assessments and interventions completed:  Yes  Care Coordination Interventions Activated:  Yes  TOC call completed earlier today  Care Coordination Interventions:  Yes, provided   Follow up plan: No further intervention required.  Patient declined ongoing care coordination/ care management follow up/ outreach  Multidisciplinary Team Attendees:   Reginia Naas, RN CM TOC; Tomasa Rand, RN CM Care Coordinator; Eduard Clos, LCSW  Scribe for Multidisciplinary Case Review:  N/A  Oneta Rack, RN, BSN, CCRN Alumnus RN CM Care Coordination/ Transition [facility] [place] [facility] [phone]: direct office

## 2023-02-11 ENCOUNTER — Encounter: Payer: Self-pay | Admitting: Internal Medicine

## 2023-02-11 ENCOUNTER — Ambulatory Visit (INDEPENDENT_AMBULATORY_CARE_PROVIDER_SITE_OTHER): Payer: Medicare Other | Admitting: Cardiology

## 2023-02-11 ENCOUNTER — Encounter (HOSPITAL_BASED_OUTPATIENT_CLINIC_OR_DEPARTMENT_OTHER): Payer: Self-pay | Admitting: Family

## 2023-02-11 VITALS — BP 130/58 | HR 72 | Ht 66.0 in | Wt 159.0 lb

## 2023-02-11 DIAGNOSIS — Z79899 Other long term (current) drug therapy: Secondary | ICD-10-CM | POA: Diagnosis not present

## 2023-02-11 DIAGNOSIS — I5032 Chronic diastolic (congestive) heart failure: Secondary | ICD-10-CM | POA: Diagnosis not present

## 2023-02-11 DIAGNOSIS — I2510 Atherosclerotic heart disease of native coronary artery without angina pectoris: Secondary | ICD-10-CM | POA: Diagnosis not present

## 2023-02-11 DIAGNOSIS — I2584 Coronary atherosclerosis due to calcified coronary lesion: Secondary | ICD-10-CM

## 2023-02-11 DIAGNOSIS — I10 Essential (primary) hypertension: Secondary | ICD-10-CM

## 2023-02-11 DIAGNOSIS — R42 Dizziness and giddiness: Secondary | ICD-10-CM | POA: Diagnosis not present

## 2023-02-11 DIAGNOSIS — Z95818 Presence of other cardiac implants and grafts: Secondary | ICD-10-CM | POA: Diagnosis not present

## 2023-02-11 DIAGNOSIS — I480 Paroxysmal atrial fibrillation: Secondary | ICD-10-CM | POA: Diagnosis not present

## 2023-02-11 DIAGNOSIS — J449 Chronic obstructive pulmonary disease, unspecified: Secondary | ICD-10-CM | POA: Diagnosis not present

## 2023-02-11 MED ORDER — CARVEDILOL 3.125 MG PO TABS: 3.1250 mg | ORAL_TABLET | Freq: Two times a day (BID) | ORAL | 3 refills | Status: DC

## 2023-02-11 MED ORDER — AMLODIPINE BESYLATE 2.5 MG PO TABS: 2.5000 mg | ORAL_TABLET | Freq: Every day | ORAL | 3 refills | Status: DC

## 2023-02-11 NOTE — Progress Notes (Signed)
Cardiology Office Note:    Date:  02/11/2023   ID:  Paul [name], DOB [birth year], MRN [id number]  PCP:  [name], MD   [place] [facility] Cardiologist:  Candee Furbish, MD Electrophysiologist:  Vickie Epley, MD     Referring MD: [name], MD   No chief complaint on file.   History of Present Illness:    Paul [name] is a 77 y.o. male with a hx of COPD (under palliative care), atrial fibrillation (s/p Watchman procedure 08/02/2021), coronary artery calcification, hypertension, DM2, chronic diastolic congestive heart failure, GERD, syncope.   Initially referred to cardiology for atrial fibrillation and multivessel coronary atherosclerosis.  He has notoriously been intolerant of beta-blockers, and anticoagulation.  He has had several syncopal episodes related to beta-blockers.  Anticoagulation caused gross hematuria.  He was referred to Dr. Quentin Ore for a Watchman device and this was placed in August 2022 for PAF, as he was intolerant to anticoagulation and beta-blockers.  He continued to have dizziness, lightheadedness after stopping his beta-blocker.  Most recently evaluated in our office with Dr. Marlou Porch on 07/25/2022, at that time he was doing well from a cardiac perspective however his COPD was quite bothersome following recent exacerbation.  He was off of his Lasix and his Bystolic.  01/16/2023 to 01/19/2022 -admitted for COPD exacerbation, discharged with home O2  01/25/2023 to 01/28/2023 -admitted for A-fib RVR, he converted with diltiazem infusion, plan to follow-up with EP as he has not tolerated anticoagulation or beta-blocker therapy  02/04/2023 to 02/07/2023-admitted for acute on chronic hypoxic respiratory failure, acute on chronic diastolic congestive heart failure.  He was again noted to be in A-fib RVR, started on digoxin and carvedilol and had converted to normal sinus rhythm upon discharge.  He presents today for hospital follow-up visit accompanied by his  wife.  He states he is feeling okay, he continues to be dizzy, and he is concerned about recurrent falls.  He states the dizziness typically occurs after he first stands up, he is aware he should stand slowly and allow his self time to adjust, the dizziness then typically subsides however if he turns a corner in his house the dizziness comes back.  He feels like his shortness of breath is at baseline, he has his oxygen with him today however he is not wearing it.  States he wears it at bedtime and as needed with exertion.  He has been weighing himself daily since discharge and has noticed that he has lost about 7 pounds.  He states his weights typically fluctuate between 1 52-1 54.  He has been checking his blood pressure at home at various times throughout the day and notices that his blood pressure continues to fluctuate. He denies chest pain, palpitations, pnd, orthopnea, n, v, syncope, edema, weight gain, or early satiety.    Past Medical History:  Diagnosis Date   Anemia    Aortic atherosclerosis ([place])    Aspiration pneumonia due to inhalation of vomitus ([place]) 02/03/2022   Atrial fibrillation (HCC)    Back pain    COPD (chronic obstructive pulmonary disease) (HCC)    COPD exacerbation ([place]) 12/27/2021   COVID-19 12/24/2020   Diabetes mellitus without complication (HCC)    Dizziness 04/08/2022   Elevated PSA    GAD (generalized anxiety disorder)    GERD (gastroesophageal reflux disease)    High cholesterol    Hypertension    Lingular pneumonia 01/05/2021   See cxr 01/06/20 rx with zpak/ cefipime x  5 days and then developed purulent sputum again 01/12/21 so rx levcaquin 500 mg daily x 7 days then return for cxr before more    Lung nodule < 6cm on CT 05/29/2016   Osteoporosis    Oxygen deficiency    Pneumonia    January 2022   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 Quentin Ore with a 20 mm Watchman FLX device   RSV bronchitis 01/27/2022    Vertebral compression fracture ([place]) 05/29/2019   T8 compression fracture noted on CT scan 05/28/2019 inpatient with chronic steroid dependent COPD - rx calcitonin nasal spray rx per PCP     Past Surgical History:  Procedure Laterality Date   CATARACT EXTRACTION Right    ELBOW SURGERY  07/2022   surgery on olecranon bursa. Dr. Greta Doom   IR KYPHO EA ADDL LEVEL THORACIC OR LUMBAR  11/28/2021   LEFT ATRIAL APPENDAGE OCCLUSION N/A 08/02/2021   Procedure: LEFT ATRIAL APPENDAGE OCCLUSION;  Surgeon: Sherren Mocha, MD;  Location: [facility];  Service: Cardiovascular;  Laterality: N/A;   SKIN SURGERY     shoulders and chest   TEE WITHOUT CARDIOVERSION N/A 08/02/2021   Procedure: TRANSESOPHAGEAL ECHOCARDIOGRAM (TEE);  Surgeon: Sherren Mocha, MD;  Location: [facility];  Service: Cardiovascular;  Laterality: N/A;   TEE WITHOUT CARDIOVERSION N/A 09/13/2021   Procedure: TRANSESOPHAGEAL ECHOCARDIOGRAM (TEE);  Surgeon: Sanda Klein, MD;  Location: [facility];  Service: Cardiovascular;  Laterality: N/A;    Current Medications: Current Meds  Medication Sig   acetaminophen (TYLENOL) 500 MG tablet Take 1,000 mg by mouth at bedtime as needed (pain).   albuterol (PROAIR HFA) 108 (90 Base) MCG/ACT inhaler Inhale 2 puffs into the lungs as needed for wheezing or shortness of breath.   albuterol (PROVENTIL) (2.5 MG/3ML) 0.083% nebulizer solution Take 3 mLs (2.5 mg total) by nebulization every 6 (six) hours as needed for shortness of breath or wheezing.   ALPRAZolam (XANAX) 0.5 MG tablet Take 1 tablet (0.5 mg total) by mouth 2 (two) times daily as needed for anxiety.   amLODipine (NORVASC) 2.5 MG tablet Take 1 tablet (2.5 mg total) by mouth daily.   Ascorbic Acid (VITAMIN C) 1000 MG tablet Take 1,000 mg by mouth daily.   aspirin EC 81 MG tablet Take 1 tablet (81 mg total) by mouth daily. Swallow whole.   atorvastatin (LIPITOR) 40 MG tablet TAKE 1 TABLET BY MOUTH EVERYDAY AT BEDTIME (Patient  taking differently: Take 40 mg by mouth at bedtime.)   Budeson-Glycopyrrol-Formoterol (BREZTRI AEROSPHERE) 160-9-4.8 MCG/ACT AERO Inhale 2 puffs into the lungs 2 (two) times daily.   calcium carbonate (OS-CAL) 600 MG TABS tablet Take 600 mg by mouth daily.   Cholecalciferol (VITAMIN D3) 50 MCG (2000 UT) TABS Take 2,000 Units by mouth in the morning.   denosumab (PROLIA) 60 MG/ML SOSY injection Inject 60 mg into the skin every 6 (six) months.   digoxin (LANOXIN) 0.125 MG tablet Take 1 tablet (0.125 mg total) by mouth daily.   doxycycline (VIBRA-TABS) 100 MG tablet Take 1 tablet (100 mg total) by mouth every 12 (twelve) hours for 7 doses.   ferrous sulfate 325 (65 FE) MG tablet Take 325 mg by mouth 2 (two) times daily with a meal.   fluticasone (FLONASE) 50 MCG/ACT nasal spray Place 2 sprays into both nostrils daily as needed for allergies or rhinitis.   furosemide (LASIX) 40 MG tablet Take 1 tablet (40 mg total) by mouth 2 (  two) times daily.   loratadine (CLARITIN) 10 MG tablet Take 10 mg by mouth in the morning.   Melatonin 12 MG TABS Take 12 mg by mouth at bedtime.   Multiple Vitamin (MULTIVITAMIN WITH MINERALS) TABS tablet Take 1 tablet by mouth daily with breakfast.   OXYGEN Inhale 2 L/min into the lungs at bedtime.   pantoprazole (PROTONIX) 40 MG tablet Take 1 tablet (40 mg total) by mouth 2 (two) times daily.   predniSONE (DELTASONE) 10 MG tablet Take 3 tablets (30 mg total) by mouth daily for 2 days, THEN 2 tablets (20 mg total) daily for 2 days, THEN 1 tablet (10 mg total) daily for 2 days.   Probiotic Product (PROBIOTIC DAILY) CAPS Take 1 capsule by mouth in the morning.   sertraline (ZOLOFT) 50 MG tablet Take 1 tablet (50 mg total) by mouth at bedtime.   [DISCONTINUED] carvedilol (COREG) 6.25 MG tablet Take 1 tablet (6.25 mg total) by mouth 2 (two) times daily with a meal.     Allergies:   Tape, Daliresp [roflumilast], Levaquin [levofloxacin], and Alendronate   Social History    Socioeconomic History   Marital status: Married    Spouse name: Not on file   Number of children: 3   Years of education: Not on file   Highest education level: Not on file  Occupational History   Occupation: retired    Comment: truck driver  Tobacco Use   Smoking status: Former    Packs/day: 2.00    Years: 52.00    Total pack years: 104.00    Types: Cigarettes    Quit date: 02/03/2009    Years since quitting: 14.0   Smokeless tobacco: Never   Tobacco comments:    Counseled to remain smoke free  Vaping Use   Vaping Use: Never used  Substance and Sexual Activity   Alcohol use: Not Currently    Alcohol/week: 0.0 standard drinks of alcohol    Comment: occ   Drug use: No   Sexual activity: Not on file  Other Topics Concern   Not on file  Social History Narrative   Originally from [place]. Previously worked driving a Restaurant manager, fast food. No pets currently. No mold exposure. 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       wears sunscreen, brushes and flosses daily, see's dentist bi-annually, has smoke/carbon monoxide detectors, wears a seatbelt and practices gun safety      Social Determinants of Health   Financial Resource Strain: Medium Risk (10/31/2022)   Overall Financial Resource Strain (CARDIA)    Difficulty of Paying Living Expenses: Somewhat hard  Food Insecurity: No Food Insecurity (02/10/2023)   Hunger Vital Sign    Worried About Running Out of Food in the Last Year: Never true    Ran Out of Food in the Last Year: Never true  Transportation Needs: No Transportation Needs (02/10/2023)   PRAPARE - Hydrologist (Medical): No    Lack of Transportation (Non-Medical): No  Physical Activity: Insufficiently Active (02/15/2022)   Exercise Vital Sign    Days of Exercise per Week: 7 days    Minutes of Exercise per Session: 10 min  Stress: No Stress Concern Present (02/15/2022)   [place]    Feeling of Stress : Only a little  Social Connections: Socially Integrated (02/15/2022)   Social Connection and Isolation Panel [NHANES]  Frequency of Communication with Friends and Family: More than three times a week    Frequency of Social Gatherings with Friends and Family: More than three times a week    Attends Religious Services: More than 4 times per year    Active Member of [employer] or Organizations: Yes    Attends Music therapist: More than 4 times per year    Marital Status: Married     Family History: The patient's family history includes Cancer in his paternal uncle; Heart disease in his brother, father, and mother.  ROS:   Please see the history of present illness.    All other systems reviewed and are negative.  EKGs/Labs/Other Studies Reviewed:    The following studies were reviewed today:  EKG:  EKG is not ordered today.    Recent Labs: 01/25/2023: TSH 0.792 02/04/2023: ALT 42; B Natriuretic Peptide 97.3 02/07/2023: BUN 24; Creatinine, Ser 1.01; Hemoglobin 12.7; Magnesium 2.0; Platelets 223; Potassium 4.6; Sodium 138  Recent Lipid Panel    Component Value Date/Time   CHOL 189 11/18/2022 1026   TRIG 138 11/18/2022 1026   HDL 72 11/18/2022 1026   CHOLHDL 2.6 11/18/2022 1026   LDLCALC 93 11/18/2022 1026     Risk Assessment/Calculations:    CHA2DS2-VASc Score = 6   This indicates a 9.7% annual risk of stroke. The patient's score is based upon: CHF History: 1 HTN History: 1 Diabetes History: 1 Stroke History: 0 Vascular Disease History: 1 Age Score: 2 Gender Score: 0               Physical Exam:    VS:  BP (!) 130/58   Pulse 72   Ht 5' 6"$  (1.676 m)   Wt 159 lb (72.1 kg)   BMI 25.66 kg/m     Wt Readings from Last 3 Encounters:  02/11/23 159 lb (72.1 kg)  02/10/23 157 lb (71.2 kg)  02/07/23 154 lb 15.7 oz (70.3 kg)     GEN:  Ill appearing, no acute  distress HEENT: Normal NECK: No JVD; No carotid bruits LYMPHATICS: No lymphadenopathy CARDIAC: RRR, no murmurs, rubs, gallops RESPIRATORY:  Clear to auscultation but diminished, without rales, wheezing or rhonchi  ABDOMEN: Soft, non-tender, non-distended MUSCULOSKELETAL:  No edema; No deformity  SKIN: Warm and dry, multiple skin tea NEUROLOGIC:  Alert and oriented x 3 PSYCHIATRIC:  Normal affect   ASSESSMENT:    1. Paroxysmal atrial fibrillation (HCC)   2. Medication management   3. Presence of Watchman left atrial appendage closure device   4. Chronic diastolic CHF (congestive heart failure) ([place])   5. Coronary artery calcification   6. Essential hypertension   7. Chronic obstructive pulmonary disease, unspecified COPD type ([place])   8. Dizziness    PLAN:    In order of problems listed above:  Paroxysmal atrial fibrillation -regular rate and rhythm noted upon evaluation today, s/p Watchman in 2022 was noted to be in correct placement with no leaking on TEE in September 2022.  Traditionally has been intolerant of beta-blocker secondary to syncopal episodes and falls.  He was started on carvedilol 6.25 mg at most recent hospital discharge.  The dizziness has persisted and is quite bothersome for him.  Will decrease his carvedilol to 3.125 mg twice daily.  Continue digoxin, he had already taken his morning dose so we are unable to check his digoxin level today.  Will message his PCP, who he plans to see tomorrow, and see if they can check his  digoxin level.  CHA2DS2-VASc Score = 6 [CHF History: 1, HTN History: 1, Diabetes History: 1, Stroke History: 0, Vascular Disease History: 1, Age Score: 2, Gender Score: 0].  Therefore, the patient's annual risk of stroke is 9.7 %.    Chronic diastolic heart failure -echo on 01/26/2023 shows EF 65 to 70%, grade 1 DD.  He is weighing himself daily, he is down 7 pounds since his hospital admission.  He is adhering to fluid restriction of approximately 64  ounces per day.  He is euvolemic upon evaluation.  Will decrease carvedilol to 3.125 mg twice a day per above, continue Lasix 40 mg twice a day. Coronary artery calcification - Stable with no anginal symptoms. No indication for ischemic evaluation.  Continue aspirin, carvedilol. Hypertension -he has had several readings that are elevated both inpatient as well as at home.  Blood pressure in office today is 160/64, rechecked it was 130/58.  Will start him on amlodipine 2.5 mg daily.  They are instructed with correct blood pressure techniques and to notify us if they are having consistent systolic readings greater than 130. COPD -he appears dyspneic at rest, pt and his wife endorse that this is his baseline for him.  He has oxygen in the room however does not prefer to use it unless he is actively moving.  Follows with pulmonology and saw them yesterday.  Is followed with outpatient palliative care as well. Dizziness -this has been persistent and ongoing for him for some time, traditionally is worsened with beta-blocker use.  He had a recent fall a few days ago, without injury.  Will decrease his carvedilol to dose to 3.125 mg twice a day.  He has decreased his fluid intake due to his heart failure exacerbation, consuming roughly 64 ounces of fluid per day.  We may need to cut his Lasix down to once per day.  He plans to have lab work with his PCP tomorrow, will evaluate that lab work and then decide if he should cut down on his Lasix dose.     Disposition-decrease carvedilol to 3.125 mg twice a day, start Norvasc 2.5 mg once a day.  Keep a blood pressure record and report any persistent elevated readings.  Return in 6 weeks.   Medication Adjustments/Labs and Tests Ordered: Current medicines are reviewed at length with the patient today.  Concerns regarding medicines are outlined above.  Orders Placed This Encounter  Procedures   Digoxin level   Meds ordered this encounter  Medications   carvedilol  (COREG) 3.125 MG tablet    Sig: Take 1 tablet (3.125 mg total) by mouth 2 (two) times daily with a meal.    Dispense:  180 tablet    Refill:  3   amLODipine (NORVASC) 2.5 MG tablet    Sig: Take 1 tablet (2.5 mg total) by mouth daily.    Dispense:  90 tablet    Refill:  3    Patient Instructions  Medication Instructions:  Your physician has recommended you make the following change in your medication:   Decrease: Carvedilol 3.139m twice daily   Start: Amlodipine 2.541mdaily   *If you need a refill on your cardiac medications before your next appointment, please call your pharmacy*   Lab Work: Your physician recommends that you return for lab work tomorrow with PCP- Digoxin Level   If you have labs (blood work) drawn today and your tests are completely normal, you will receive your results only by: MyJetmoreif you have  MyChart) OR A paper copy in the mail If you have any lab test that is abnormal or we need to change your treatment, we will call you to review the results.  Follow-Up: At [facility], you and your health needs are our priority.  As part of our continuing mission to provide you with exceptional heart care, we have created designated Provider Care Teams.  These Care Teams include your primary Cardiologist (physician) and Advanced Practice Providers (APPs -  Physician Assistants and Nurse Practitioners) who all work together to provide you with the care you need, when you need it.  We recommend signing up for the patient portal called "MyChart".  Sign up information is provided on this After Visit Summary.  MyChart is used to connect with patients for Virtual Visits (Telemedicine).  Patients are able to view lab/test results, encounter notes, upcoming appointments, etc.  Non-urgent messages can be sent to your provider as well.   To learn more about what you can do with MyChart, go to [email or web address].    Your next appointment:   Follow up as  scheduled with Dr. Quentin Ore   &   6 weeks with Marlou Porch or APP   Other Instructions Please take your blood pressure cuff to your appointment with primary care tomorrow so they can be sure it is accurate. If you forget it, can bring to your upcoming appointment with Dr. Quentin Ore Please check your blood pressure daily about 1-2 hours after medication and keep a log. Please call us if that blood pressure is consistently greater than 140/80.   Recommend weighing daily and keeping a log. Please call our office if you have weight gain of 2 pounds overnight or 5 pounds in 1 week.   Tips to Measure your Blood Pressure Correctly  Here's what you can do to ensure a correct reading:  Don't drink a caffeinated beverage or smoke during the 30 minutes before the test.  Sit quietly for five minutes before the test begins.  During the measurement, sit in a chair with your feet on the floor and your arm supported so your elbow is at about heart level.  The inflatable part of the cuff should completely cover at least 80% of your upper arm, and the cuff should be placed on bare skin, not over a shirt.  Don't talk during the measurement.   Blood pressure categories  Blood pressure category SYSTOLIC (upper number)  DIASTOLIC (lower number)  Normal Less than 120 mm Hg and Less than 80 mm Hg  Elevated 120-129 mm Hg and Less than 80 mm Hg  High blood pressure: Stage 1 hypertension 130-139 mm Hg or 80-89 mm Hg  High blood pressure: Stage 2 hypertension 140 mm Hg or higher or 90 mm Hg or higher  Hypertensive crisis (consult your doctor immediately) Higher than 180 mm Hg and/or Higher than 120 mm Hg  Source: American Heart Association and American Stroke Association. For more on getting your blood pressure under control, buy Controlling Your Blood Pressure, a Special Health Report from [facility].   Blood Pressure Log   Date   Time  Blood Pressure  Example: Nov 1 9 AM 124/78                                                   Signed, Trudi Ida,  NP  02/11/2023 11:14 AM    [facility]

## 2023-02-11 NOTE — Assessment & Plan Note (Signed)
Hs 02 since around 2005 - referred for best fit 05/29/2021  ABG 02/2022- OP ABG -not during flare - ph 7.49. Pco2 40, HCO3 30, Po2 94, O2 sats 98%  Presently on 2lpm rest/ hs and up to 3lpm with activity  Advised Make sure you check your oxygen saturation  AT  your highest level of activity (not after you stop)   to be sure it stays over 90% and adjust  02 flow upward to maintain this level if needed but remember to turn it back to previous settings when you stop (to conserve your supply).    F/u q 3 m sooner prn          Each maintenance medication was reviewed in detail including emphasizing most importantly the difference between maintenance and prns and under what circumstances the prns are to be triggered using an action plan format where appropriate.  Total time for H and P, chart review, counseling, reviewing hfa/neb/02  device(s) and generating customized AVS unique to this office visit / same day charting = 21 min

## 2023-02-11 NOTE — Assessment & Plan Note (Signed)
Quit smoking 01/2009  02/04/12 [name]: FEV1 36% FVC 57% FEF 25 75    11% -05/29/16  FEV1 0.83 L (31%) FEV1/FVC 0.40 FEF 25-75 0.30 L (14%) no bronchodilator response p ? rx prior, DLCO corrected 60% (hemoglobin 10.9) - 03/29/2019   continue symb 160 2bid and spiriva smi 2 puffs each am  - 04/15/2019 placed on daily pred with ceiling 40 and floor of 10 mg daily and empirical rx for GERD - 04/15/2019 flutter valve training  - alpha one screen 06/15/2019  MM  Level 178 - 06/15/2019  After extensive coaching inhaler device,  effectiveness =    90% > so try to taper pred off using ceiling of 20 if worse > 07/26/2019 changed to ceiling of 10 and floor of 5 mg  - 10/26/2019  After extensive coaching inhaler device,  effectiveness =    90% with elipta and still needing pred 20 mg daily so try trelegy sample > consider breztri if prefers hfa and insurance covers  - 12/27/2019 added ppi bid ac otc for atypical spells of sob  - 01/27/2020 changed lopressor to bisoprolol due to refractory wheeze/ sob on lopressor 100 mg daily - Sinus CT  02/03/20 1. Minimal paranasal sinus mucosal thickening, limited to a left ethmoid air cell. No convincing acute sinusitis. 2. Symmetric but mild nasal cavity mucosal thickening, raising the possibility of mild rhinitis. CT chest 02/03/20 1. Coronary artery calcifications are noted suggesting coronary artery disease. 2. Emphysematous disease is noted in the upper lobes bilaterally. 3. Interval development of several old thoracic compression fractures since prior exam. No definite acute fracture is noted.  - 02/24/20 trial of daliresp 250 > could not tol   - 02/19/2021  After extensive coaching inhaler device,  effectiveness =   80% > try change to breztri    Group D (now reclassified as E) in terms of symptom/risk and laba/lama/ICS  therefore appropriate rx at this point >>>  continue breztri and approp saba

## 2023-02-11 NOTE — Patient Instructions (Addendum)
Medication Instructions:  Your physician has recommended you make the following change in your medication:   Decrease: Carvedilol 3.170m twice daily   Start: Amlodipine 2.527mdaily   *If you need a refill on your cardiac medications before your next appointment, please call your pharmacy*   Lab Work: Your physician recommends that you return for lab work tomorrow with PCP- Digoxin Level   If you have labs (blood work) drawn today and your tests are completely normal, you will receive your results only by: My[facility]if you have MyChart) OR A paper copy in the mail If you have any lab test that is abnormal or we need to change your treatment, we will call you to review the results.  Follow-Up: [facility].  As part of our continuing mission to provide you with exceptional heart care, we have created designated Provider Care Teams.  These Care Teams include your primary Cardiologist (physician) and Advanced Practice Providers (APPs -  Physician Assistants and Nurse Practitioners) who all work together to provide you with the care you need, when you need it.  We recommend signing up for the patient portal called "MyChart".  Sign up information is provided on this After Visit Summary.  MyChart is used to connect with patients for Virtual Visits (Telemedicine).  Patients are able to view lab/test results, encounter notes, upcoming appointments, etc.  Non-urgent messages can be sent to your provider as well.   To learn more about what you can do with MyChart, go to [email or web address]   Your next appointment:   Follow up as scheduled with Paul [name] &   6 weeks with Paul [name] APP   Other Instructions Please take your blood pressure cuff to your appointment with primary care tomorrow so they can be sure it is accurate. If you forget it, can bring to your upcoming appointment with Dr. LaQuentin Orelease check your blood  pressure daily about 1-2 hours after medication and keep a log. Please call usKoreaf that blood pressure is consistently greater than 140/80.   Recommend weighing daily and keeping a log. Please call our office if you have weight gain of 2 pounds overnight or 5 pounds in 1 week.   Tips to Measure your Blood Pressure Correctly  Here's what you can do to ensure a correct reading:  Don't drink a caffeinated beverage or smoke during the 30 minutes before the test.  Sit quietly for five minutes before the test begins.  During the measurement, sit in a chair with your feet on the floor and your arm supported so your elbow is at about heart level.  The inflatable part of the cuff should completely cover at least 80% of your upper arm, and the cuff should be placed on bare skin, not over a shirt.  Don't talk during the measurement.   Blood pressure categories  Blood pressure category SYSTOLIC (upper number)  DIASTOLIC (lower number)  Normal Less than 120 mm Hg and Less than 80 mm Hg  Elevated 120-129 mm Hg and Less than 80 mm Hg  High blood pressure: Stage 1 hypertension 130-139 mm Hg or 80-89 mm Hg  High blood pressure: Stage 2 hypertension 140 mm Hg or higher or 90 mm Hg or higher  Hypertensive crisis (consult your doctor immediately) Higher than 180 mm Hg and/or Higher than 120 mm Hg  Source: American Heart Association and American Stroke Association. For more on getting  your blood pressure under control, buy Controlling Your Blood Pressure, a Special Health Report from [facility].   Blood Pressure Log   Date   Time  Blood Pressure  Example: Nov 1 9 AM 124/78

## 2023-02-12 ENCOUNTER — Ambulatory Visit (INDEPENDENT_AMBULATORY_CARE_PROVIDER_SITE_OTHER): Payer: Medicare Other | Admitting: Family Medicine

## 2023-02-12 ENCOUNTER — Encounter: Payer: Self-pay | Admitting: Family Medicine

## 2023-02-12 VITALS — BP 132/62 | HR 75 | Temp 97.4°F | Ht 66.0 in | Wt 158.0 lb

## 2023-02-12 DIAGNOSIS — J441 Chronic obstructive pulmonary disease with (acute) exacerbation: Secondary | ICD-10-CM

## 2023-02-12 DIAGNOSIS — C44622 Squamous cell carcinoma of skin of right upper limb, including shoulder: Secondary | ICD-10-CM | POA: Diagnosis not present

## 2023-02-12 DIAGNOSIS — I480 Paroxysmal atrial fibrillation: Secondary | ICD-10-CM

## 2023-02-12 DIAGNOSIS — I110 Hypertensive heart disease with heart failure: Secondary | ICD-10-CM | POA: Diagnosis not present

## 2023-02-12 DIAGNOSIS — I5032 Chronic diastolic (congestive) heart failure: Secondary | ICD-10-CM | POA: Diagnosis not present

## 2023-02-12 DIAGNOSIS — C44629 Squamous cell carcinoma of skin of left upper limb, including shoulder: Secondary | ICD-10-CM | POA: Diagnosis not present

## 2023-02-12 DIAGNOSIS — L570 Actinic keratosis: Secondary | ICD-10-CM | POA: Diagnosis not present

## 2023-02-12 DIAGNOSIS — J9621 Acute and chronic respiratory failure with hypoxia: Secondary | ICD-10-CM | POA: Diagnosis not present

## 2023-02-12 DIAGNOSIS — C44529 Squamous cell carcinoma of skin of other part of trunk: Secondary | ICD-10-CM | POA: Diagnosis not present

## 2023-02-12 DIAGNOSIS — R233 Spontaneous ecchymoses: Secondary | ICD-10-CM | POA: Diagnosis not present

## 2023-02-12 DIAGNOSIS — L578 Other skin changes due to chronic exposure to nonionizing radiation: Secondary | ICD-10-CM | POA: Diagnosis not present

## 2023-02-12 DIAGNOSIS — C44519 Basal cell carcinoma of skin of other part of trunk: Secondary | ICD-10-CM | POA: Diagnosis not present

## 2023-02-12 NOTE — Progress Notes (Unsigned)
Subjective:  Patient ID: Paul [name], male    DOB: [birth year]  Age: 77 y.o. MRN: [id number]  Chief Complaint  Patient presents with   Hospitalization Follow-up    HPI   Follow up Hospitalization  Patient was admitted to [facility] on 02/04/2023 and discharged on 02/07/2023. He was treated for Acute on Chronic Diastolic CHF exacerbation, Atrial Fibrillation with RVR. Patient Presented to the ER with Shortness of breath and leg swelling, which now have both resolved.   Acute on chronic diastolic congestive heart failure exacerbation Etiology likely secondary to uncontrolled hypertension and atrial fibrillation.  TTE 01/26/2023 with LVEF Q000111Q, grade 1 diastolic dysfunction, trivial MR, no aortic stenosis, IVC normal in size.  Given  IV furosemide and transitioned to 40 mg p.o. twice daily at time of discharge.Pt keeping daily weight log -    COPD exacerbation, acute Complete 7 day course of  doxycycline and  prednisone taper, currently on 20 daily.  Resume home Breztri inhaler and continue 2 L nasal cannula at baseline.   Paroxysmal atrial fibrillation with RVR Patient was noted on telemetry to be in and out atrial fibrillation with RVR 120s. Patient reports heart rate up to 160s at home.  On admission loaded with IV digoxin x 2.  Started on carvedilol and increased to 6.25 mg p.o. twice daily. Seen yesterday by cardiology and dose halfed to 3.125 mg twice daily.  Continued on  digoxin 0.125 mg p.o. daily.  At discharge, patient's A-fib has converted to normal sinus rhythm.   Needs digoxin level and cmp.   He reports excellent compliance with treatment. He reports this condition is improved.      11/22/2022    9:35 AM 08/16/2022    8:20 AM 02/15/2022    8:33 AM 01/14/2022   11:14 AM 12/11/2021    1:11 PM  Depression screen PHQ 2/9  Decreased Interest 0 0 0 0 0  Down, Depressed, Hopeless 0 0 0 0 0  PHQ - 2 Score 0 0 0 0 0         07/20/2022    8:00 AM 07/20/2022    11:00 PM 07/21/2022   10:00 AM 08/16/2022    8:20 AM 11/22/2022    9:34 AM  Fall Risk  Falls in the past year?    1 0  Was there an injury with Fall?    0 0  Fall Risk Category Calculator    1 0  Fall Risk Category (Retired)    Low Low  (RETIRED) Patient Fall Risk Level Moderate fall risk Moderate fall risk High fall risk Low fall risk Low fall risk  Patient at Risk for Falls Due to    History of fall(s) History of fall(s)  Fall risk Follow up    Falls evaluation completed Falls evaluation completed      Review of Systems  Constitutional:  Negative for appetite change, fatigue and fever.  HENT:  Negative for congestion, ear pain, sinus pressure and sore throat.   Respiratory:  Negative for cough, chest tightness, shortness of breath and wheezing.   Cardiovascular:  Negative for chest pain and palpitations.  Gastrointestinal:  Negative for abdominal pain, constipation, diarrhea, nausea and vomiting.  Genitourinary:  Negative for dysuria and hematuria.  Musculoskeletal:  Negative for arthralgias, back pain, joint swelling and myalgias.  Skin:  Negative for rash.  Neurological:  Positive for dizziness. Negative for weakness and headaches.  Psychiatric/Behavioral:  Negative for dysphoric mood. The patient is not  nervous/anxious.     No current facility-administered medications on file prior to visit.   Current Outpatient Medications on File Prior to Visit  Medication Sig Dispense Refill   acetaminophen (TYLENOL) 500 MG tablet Take 1,000 mg by mouth at bedtime as needed (pain).     albuterol (PROAIR HFA) 108 (90 Base) MCG/ACT inhaler Inhale 2 puffs into the lungs as needed for wheezing or shortness of breath.     albuterol (PROVENTIL) (2.5 MG/3ML) 0.083% nebulizer solution Take 3 mLs (2.5 mg total) by nebulization every 6 (six) hours as needed for shortness of breath or wheezing. 360 mL 5   ALPRAZolam (XANAX) 0.5 MG tablet Take 1 tablet (0.5 mg total) by mouth 2 (two) times daily as needed  for anxiety. 30 tablet 0   amLODipine (NORVASC) 2.5 MG tablet Take 1 tablet (2.5 mg total) by mouth daily. 90 tablet 3   Ascorbic Acid (VITAMIN C) 1000 MG tablet Take 1,000 mg by mouth daily.     aspirin EC 81 MG tablet Take 1 tablet (81 mg total) by mouth daily. Swallow whole. 90 tablet 3   atorvastatin (LIPITOR) 40 MG tablet TAKE 1 TABLET BY MOUTH EVERYDAY AT BEDTIME (Patient taking differently: Take 40 mg by mouth at bedtime.) 90 tablet 0   Budeson-Glycopyrrol-Formoterol (BREZTRI AEROSPHERE) 160-9-4.8 MCG/ACT AERO Inhale 2 puffs into the lungs 2 (two) times daily. 3 each 3   calcium carbonate (OS-CAL) 600 MG TABS tablet Take 600 mg by mouth daily.     carvedilol (COREG) 3.125 MG tablet Take 1 tablet (3.125 mg total) by mouth 2 (two) times daily with a meal. 180 tablet 3   Cholecalciferol (VITAMIN D3) 50 MCG (2000 UT) TABS Take 2,000 Units by mouth in the morning.     denosumab (PROLIA) 60 MG/ML SOSY injection Inject 60 mg into the skin every 6 (six) months.     digoxin (LANOXIN) 0.125 MG tablet Take 1 tablet (0.125 mg total) by mouth daily. 30 tablet 2   ferrous sulfate 325 (65 FE) MG tablet Take 325 mg by mouth 2 (two) times daily with a meal.     fluticasone (FLONASE) 50 MCG/ACT nasal spray Place 2 sprays into both nostrils daily as needed for allergies or rhinitis.     furosemide (LASIX) 40 MG tablet Take 1 tablet (40 mg total) by mouth 2 (two) times daily. 60 tablet 2   loratadine (CLARITIN) 10 MG tablet Take 10 mg by mouth in the morning.     Melatonin 12 MG TABS Take 12 mg by mouth at bedtime.     Multiple Vitamin (MULTIVITAMIN WITH MINERALS) TABS tablet Take 1 tablet by mouth daily with breakfast.     OXYGEN Inhale 2 L/min into the lungs at bedtime.     pantoprazole (PROTONIX) 40 MG tablet Take 1 tablet (40 mg total) by mouth 2 (two) times daily. 180 tablet 3   Probiotic Product (PROBIOTIC DAILY) CAPS Take 1 capsule by mouth in the morning.     sertraline (ZOLOFT) 50 MG tablet Take 1  tablet (50 mg total) by mouth at bedtime. 90 tablet 1   Past Medical History:  Diagnosis Date   Anemia    Aortic atherosclerosis (HCC)    Aspiration pneumonia due to inhalation of vomitus ([place]) 02/03/2022   Atrial fibrillation (HCC)    Back pain    COPD (chronic obstructive pulmonary disease) (HCC)    COPD exacerbation ([place]) 12/27/2021   COVID-19 12/24/2020   Diabetes mellitus without complication ([place])  Dizziness 04/08/2022   Elevated PSA    GAD (generalized anxiety disorder)    GERD (gastroesophageal reflux disease)    High cholesterol    Hypertension    Lingular pneumonia 01/05/2021   See cxr 01/06/20 rx with zpak/ cefipime x 5 days and then developed purulent sputum again 01/12/21 so rx levcaquin 500 mg daily x 7 days then return for cxr before more    Lung nodule < 6cm on CT 05/29/2016   Osteoporosis    Oxygen deficiency    Pneumonia    January 2022   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 Quentin Ore with a 20 mm Watchman FLX device   RSV bronchitis 01/27/2022   Vertebral compression fracture ([place]) 05/29/2019   T8 compression fracture noted on CT scan 05/28/2019 inpatient with chronic steroid dependent COPD - rx calcitonin nasal spray rx per PCP    Past Surgical History:  Procedure Laterality Date   CATARACT EXTRACTION Right    ELBOW SURGERY  07/2022   surgery on olecranon bursa. Dr. Greta Doom   IR KYPHO EA ADDL LEVEL THORACIC OR LUMBAR  11/28/2021   LEFT ATRIAL APPENDAGE OCCLUSION N/A 08/02/2021   Procedure: LEFT ATRIAL APPENDAGE OCCLUSION;  Surgeon: Sherren Mocha, MD;  Location: [facility];  Service: Cardiovascular;  Laterality: N/A;   SKIN SURGERY     shoulders and chest   TEE WITHOUT CARDIOVERSION N/A 08/02/2021   Procedure: TRANSESOPHAGEAL ECHOCARDIOGRAM (TEE);  Surgeon: Sherren Mocha, MD;  Location: [facility];  Service: Cardiovascular;  Laterality: N/A;   TEE WITHOUT CARDIOVERSION N/A 09/13/2021    Procedure: TRANSESOPHAGEAL ECHOCARDIOGRAM (TEE);  Surgeon: Sanda Klein, MD;  Location: [facility];  Service: Cardiovascular;  Laterality: N/A;    Family History  Problem Relation Age of Onset   Heart disease Brother    Heart disease Mother    Heart disease Father    Cancer Paternal Uncle    Social History   Socioeconomic History   Marital status: Married    Spouse name: Not on file   Number of children: 3   Years of education: Not on file   Highest education level: Not on file  Occupational History   Occupation: retired    Comment: truck driver  Tobacco Use   Smoking status: Former    Packs/day: 2.00    Years: 52.00    Total pack years: 104.00    Types: Cigarettes    Quit date: 02/03/2009    Years since quitting: 14.0   Smokeless tobacco: Never   Tobacco comments:    Counseled to remain smoke free  Vaping Use   Vaping Use: Never used  Substance and Sexual Activity   Alcohol use: Not Currently    Alcohol/week: 0.0 standard drinks of alcohol    Comment: occ   Drug use: No   Sexual activity: Not on file  Other Topics Concern   Not on file  Social History Narrative   Originally from [place]. Previously worked driving a Restaurant manager, fast food. No pets currently. No mold exposure. 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       wears sunscreen, brushes and flosses daily, see's dentist bi-annually, has smoke/carbon monoxide detectors, wears a seatbelt and practices gun safety      Social Determinants of Health   Financial Resource Strain: Medium Risk (10/31/2022)   Overall Financial Resource Strain (CARDIA)  Difficulty of Paying Living Expenses: Somewhat hard  Food Insecurity: No Food Insecurity (02/10/2023)   Hunger Vital Sign    Worried About Running Out of Food in the Last Year: Never true    Ran Out of Food in the Last Year: Never true  Transportation Needs: No Transportation Needs (02/10/2023)   PRAPARE -  Hydrologist (Medical): No    Lack of Transportation (Non-Medical): No  Physical Activity: Insufficiently Active (02/15/2022)   Exercise Vital Sign    Days of Exercise per Week: 7 days    Minutes of Exercise per Session: 10 min  Stress: No Stress Concern Present (02/15/2022)   [place]    Feeling of Stress : Only a little  Social Connections: Socially Integrated (02/15/2022)   Social Connection and Isolation Panel [NHANES]    Frequency of Communication with Friends and Family: More than three times a week    Frequency of Social Gatherings with Friends and Family: More than three times a week    Attends Religious Services: More than 4 times per year    Active Member of [employer] or Organizations: Yes    Attends Music therapist: More than 4 times per year    Marital Status: Married    Objective:  BP 132/62 (BP Location: Right Arm, Patient Position: Sitting)   Pulse 75   Temp (!) 97.4 F (36.3 C) (Temporal)   Ht '5\' 6"'$  (1.676 m)   Wt 158 lb (71.7 kg)   SpO2 93%   BMI 25.50 kg/m      02/16/2023    5:00 PM 02/16/2023   12:43 PM 02/16/2023    7:53 AM  BP/Weight  Systolic BP [id number] Q000111Q  Diastolic BP 56 51 56  Wt. (Lbs)   155.1  BMI   25.03 kg/m2    Physical Exam Vitals reviewed.  Constitutional:      Appearance: Normal appearance. He is normal weight.  Cardiovascular:     Rate and Rhythm: Normal rate and regular rhythm.     Heart sounds: Normal heart sounds.  Pulmonary:     Effort: Pulmonary effort is normal.     Breath sounds: Normal breath sounds.  Abdominal:     General: Abdomen is flat. Bowel sounds are normal.     Palpations: Abdomen is soft.     Tenderness: There is no abdominal tenderness.  Neurological:     Mental Status: He is alert and oriented to person, place, and time.  Psychiatric:        Mood and Affect: Mood normal.        Behavior: Behavior normal.      Diabetic Foot Exam - Simple   No data filed      Lab Results  Component Value Date   WBC 12.4 (H) 02/16/2023   HGB 10.9 (L) 02/16/2023   HCT 33.0 (L) 02/16/2023   PLT 223 02/16/2023   GLUCOSE 190 (H) 02/16/2023   CHOL 189 11/18/2022   TRIG 138 11/18/2022   HDL 72 11/18/2022   LDLCALC 93 11/18/2022   ALT 38 02/15/2023   AST 30 02/15/2023   NA 134 (L) 02/16/2023   K 3.6 02/16/2023   CL 89 (L) 02/16/2023   CREATININE 0.96 02/16/2023   BUN 16 02/16/2023   CO2 35 (H) 02/16/2023   TSH 0.792 01/25/2023   INR 0.9 01/25/2023   HGBA1C 6.3 (H) 11/18/2022   MICROALBUR Negative 10/09/2021  Assessment & Plan:    Chronic diastolic CHF (congestive heart failure) (HCC) Assessment & Plan: Continue Lasix 40 mg p.o. twice daily. Continue digoxin at 0.125 mg once daily. Check digoxin level. Continue daily weight logs.   Check labs.  Orders: -     Comprehensive metabolic panel -     Digoxin level  COPD with acute exacerbation (HCC) Assessment & Plan: Greatly improved.  Continue current inhalers. Complete doxycycline. Continue to taper prednisone slowly.    Paroxysmal atrial fibrillation (HCC) Assessment & Plan: Management per specialist.  Continue carvedilol 3.125 mg twice daily.   Orders: -     Digoxin level  Acute on chronic respiratory failure with hypoxia (HCC) Assessment & Plan: Improved. Back on 2 L oxygen PNC.      No orders of the defined types were placed in this encounter.   Orders Placed This Encounter  Procedures   Comprehensive metabolic panel   Digoxin level     Follow-up: No follow-ups on file.   I,Lauren M Auman,acting as a scribe for [name], MD.,have documented all relevant documentation on the behalf of [name], MD,as directed by  [name], MD while in the presence of [name], MD.   Geralynn Ochs I Leal-Borjas,acting as a scribe for [name], MD.,have documented all relevant documentation on the behalf of [name], MD,as directed by  [name], MD while in the presence of [name], MD.     An After Visit Summary was printed and given to the patient.  [name], MD [facility] [phone]

## 2023-02-13 LAB — COMPREHENSIVE METABOLIC PANEL
ALT: 42 IU/L (ref 0–44)
AST: 26 IU/L (ref 0–40)
Albumin/Globulin Ratio: 2 (ref 1.2–2.2)
Albumin: 3.9 g/dL (ref 3.8–4.8)
Alkaline Phosphatase: 95 IU/L (ref 44–121)
BUN/Creatinine Ratio: 22 (ref 10–24)
BUN: 23 mg/dL (ref 8–27)
Bilirubin Total: 0.3 mg/dL (ref 0.0–1.2)
CO2: 29 mmol/L (ref 20–29)
Calcium: 9 mg/dL (ref 8.6–10.2)
Chloride: 88 mmol/L — ABNORMAL LOW (ref 96–106)
Creatinine, Ser: 1.05 mg/dL (ref 0.76–1.27)
Globulin, Total: 2 g/dL (ref 1.5–4.5)
Glucose: 136 mg/dL — ABNORMAL HIGH (ref 70–99)
Potassium: 4.5 mmol/L (ref 3.5–5.2)
Sodium: 139 mmol/L (ref 134–144)
Total Protein: 5.9 g/dL — ABNORMAL LOW (ref 6.0–8.5)
eGFR: 73 mL/min/{1.73_m2} (ref >59)

## 2023-02-13 LAB — DIGOXIN LEVEL: Digoxin, Serum: 0.8 ng/mL (ref 0.5–0.9)

## 2023-02-15 ENCOUNTER — Encounter (HOSPITAL_BASED_OUTPATIENT_CLINIC_OR_DEPARTMENT_OTHER): Payer: Self-pay | Admitting: Emergency Medicine

## 2023-02-15 ENCOUNTER — Other Ambulatory Visit: Payer: Self-pay

## 2023-02-15 ENCOUNTER — Emergency Department (HOSPITAL_BASED_OUTPATIENT_CLINIC_OR_DEPARTMENT_OTHER): Payer: Medicare Other

## 2023-02-15 ENCOUNTER — Inpatient Hospital Stay (HOSPITAL_BASED_OUTPATIENT_CLINIC_OR_DEPARTMENT_OTHER)
Admission: EM | Admit: 2023-02-15 | Discharge: 2023-02-21 | DRG: 291 | Disposition: A | Discharge status: Home / Self Care | Payer: Medicare Other | Attending: Internal Medicine | Admitting: Internal Medicine

## 2023-02-15 DIAGNOSIS — I2510 Atherosclerotic heart disease of native coronary artery without angina pectoris: Secondary | ICD-10-CM | POA: Diagnosis present

## 2023-02-15 DIAGNOSIS — E119 Type 2 diabetes mellitus without complications: Secondary | ICD-10-CM | POA: Diagnosis present

## 2023-02-15 DIAGNOSIS — J9621 Acute and chronic respiratory failure with hypoxia: Secondary | ICD-10-CM | POA: Diagnosis present

## 2023-02-15 DIAGNOSIS — Z1152 Encounter for screening for COVID-19: Secondary | ICD-10-CM

## 2023-02-15 DIAGNOSIS — Z8249 Family history of ischemic heart disease and other diseases of the circulatory system: Secondary | ICD-10-CM | POA: Diagnosis not present

## 2023-02-15 DIAGNOSIS — E871 Hypo-osmolality and hyponatremia: Secondary | ICD-10-CM | POA: Diagnosis present

## 2023-02-15 DIAGNOSIS — Z8616 Personal history of COVID-19: Secondary | ICD-10-CM | POA: Diagnosis not present

## 2023-02-15 DIAGNOSIS — Z881 Allergy status to other antibiotic agents status: Secondary | ICD-10-CM | POA: Diagnosis not present

## 2023-02-15 DIAGNOSIS — K219 Gastro-esophageal reflux disease without esophagitis: Secondary | ICD-10-CM | POA: Diagnosis present

## 2023-02-15 DIAGNOSIS — R55 Syncope and collapse: Secondary | ICD-10-CM | POA: Diagnosis present

## 2023-02-15 DIAGNOSIS — F411 Generalized anxiety disorder: Secondary | ICD-10-CM | POA: Diagnosis present

## 2023-02-15 DIAGNOSIS — I5033 Acute on chronic diastolic (congestive) heart failure: Secondary | ICD-10-CM | POA: Diagnosis present

## 2023-02-15 DIAGNOSIS — Z7951 Long term (current) use of inhaled steroids: Secondary | ICD-10-CM

## 2023-02-15 DIAGNOSIS — Z9981 Dependence on supplemental oxygen: Secondary | ICD-10-CM

## 2023-02-15 DIAGNOSIS — I110 Hypertensive heart disease with heart failure: Secondary | ICD-10-CM | POA: Diagnosis present

## 2023-02-15 DIAGNOSIS — Z87891 Personal history of nicotine dependence: Secondary | ICD-10-CM | POA: Diagnosis not present

## 2023-02-15 DIAGNOSIS — I4891 Unspecified atrial fibrillation: Secondary | ICD-10-CM | POA: Diagnosis not present

## 2023-02-15 DIAGNOSIS — Z95818 Presence of other cardiac implants and grafts: Secondary | ICD-10-CM

## 2023-02-15 DIAGNOSIS — Z91048 Other nonmedicinal substance allergy status: Secondary | ICD-10-CM

## 2023-02-15 DIAGNOSIS — Z888 Allergy status to other drugs, medicaments and biological substances status: Secondary | ICD-10-CM

## 2023-02-15 DIAGNOSIS — E7800 Pure hypercholesterolemia, unspecified: Secondary | ICD-10-CM | POA: Diagnosis present

## 2023-02-15 DIAGNOSIS — W19XXXA Unspecified fall, initial encounter: Secondary | ICD-10-CM | POA: Diagnosis present

## 2023-02-15 DIAGNOSIS — M810 Age-related osteoporosis without current pathological fracture: Secondary | ICD-10-CM | POA: Diagnosis present

## 2023-02-15 DIAGNOSIS — J441 Chronic obstructive pulmonary disease with (acute) exacerbation: Secondary | ICD-10-CM | POA: Diagnosis not present

## 2023-02-15 DIAGNOSIS — Z809 Family history of malignant neoplasm, unspecified: Secondary | ICD-10-CM

## 2023-02-15 DIAGNOSIS — J9601 Acute respiratory failure with hypoxia: Secondary | ICD-10-CM | POA: Diagnosis not present

## 2023-02-15 DIAGNOSIS — E876 Hypokalemia: Secondary | ICD-10-CM | POA: Diagnosis present

## 2023-02-15 DIAGNOSIS — I5031 Acute diastolic (congestive) heart failure: Secondary | ICD-10-CM | POA: Diagnosis not present

## 2023-02-15 DIAGNOSIS — I10 Essential (primary) hypertension: Secondary | ICD-10-CM | POA: Diagnosis not present

## 2023-02-15 DIAGNOSIS — J449 Chronic obstructive pulmonary disease, unspecified: Secondary | ICD-10-CM | POA: Diagnosis not present

## 2023-02-15 DIAGNOSIS — R0602 Shortness of breath: Secondary | ICD-10-CM | POA: Diagnosis not present

## 2023-02-15 DIAGNOSIS — R079 Chest pain, unspecified: Secondary | ICD-10-CM | POA: Diagnosis not present

## 2023-02-15 DIAGNOSIS — Z79899 Other long term (current) drug therapy: Secondary | ICD-10-CM

## 2023-02-15 DIAGNOSIS — I480 Paroxysmal atrial fibrillation: Secondary | ICD-10-CM | POA: Diagnosis present

## 2023-02-15 DIAGNOSIS — Z7982 Long term (current) use of aspirin: Secondary | ICD-10-CM

## 2023-02-15 LAB — CBC WITH DIFFERENTIAL/PLATELET
Abs Immature Granulocytes: 0.12 10*3/uL — ABNORMAL HIGH (ref 0.00–0.07)
Basophils Absolute: 0 10*3/uL (ref 0.0–0.1)
Basophils Relative: 0 %
Eosinophils Absolute: 0 10*3/uL (ref 0.0–0.5)
Eosinophils Relative: 0 %
HCT: 35.9 % — ABNORMAL LOW (ref 39.0–52.0)
Hemoglobin: 11.5 g/dL — ABNORMAL LOW (ref 13.0–17.0)
Immature Granulocytes: 1 %
Lymphocytes Relative: 5 %
Lymphs Abs: 0.9 10*3/uL (ref 0.7–4.0)
MCH: 26 pg (ref 26.0–34.0)
MCHC: 32 g/dL (ref 30.0–36.0)
MCV: 81 fL (ref 80.0–100.0)
Monocytes Absolute: 0.8 10*3/uL (ref 0.1–1.0)
Monocytes Relative: 4 %
Neutro Abs: 16 10*3/uL — ABNORMAL HIGH (ref 1.7–7.7)
Neutrophils Relative %: 90 %
Platelets: 265 10*3/uL (ref 150–400)
RBC: 4.43 MIL/uL (ref 4.22–5.81)
RDW: 15.4 % (ref 11.5–15.5)
WBC: 17.8 10*3/uL — ABNORMAL HIGH (ref 4.0–10.5)
nRBC: 0 % (ref 0.0–0.2)

## 2023-02-15 LAB — COMPREHENSIVE METABOLIC PANEL
ALT: 38 U/L (ref 0–44)
AST: 30 U/L (ref 15–41)
Albumin: 3.1 g/dL — ABNORMAL LOW (ref 3.5–5.0)
Alkaline Phosphatase: 70 U/L (ref 38–126)
Anion gap: 9 (ref 5–15)
BUN: 19 mg/dL (ref 8–23)
CO2: 35 mmol/L — ABNORMAL HIGH (ref 22–32)
Calcium: 8 mg/dL — ABNORMAL LOW (ref 8.9–10.3)
Chloride: 88 mmol/L — ABNORMAL LOW (ref 98–111)
Creatinine, Ser: 0.93 mg/dL (ref 0.61–1.24)
GFR, Estimated: >60 mL/min (ref >60)
Glucose, Bld: 188 mg/dL — ABNORMAL HIGH (ref 70–99)
Potassium: 3.5 mmol/L (ref 3.5–5.1)
Sodium: 132 mmol/L — ABNORMAL LOW (ref 135–145)
Total Bilirubin: 0.7 mg/dL (ref 0.3–1.2)
Total Protein: 6.6 g/dL (ref 6.5–8.1)

## 2023-02-15 LAB — TROPONIN I (HIGH SENSITIVITY)
Troponin I (High Sensitivity): 14 ng/L (ref <18)
Troponin I (High Sensitivity): 13 ng/L (ref <18)

## 2023-02-15 LAB — PROCALCITONIN: Procalcitonin: <0.1 ng/mL

## 2023-02-15 LAB — RESP PANEL BY RT-PCR (RSV, FLU A&B, COVID)  RVPGX2
Influenza A by PCR: NEGATIVE
Influenza B by PCR: NEGATIVE
Resp Syncytial Virus by PCR: NEGATIVE
SARS Coronavirus 2 by RT PCR: NEGATIVE

## 2023-02-15 LAB — BRAIN NATRIURETIC PEPTIDE: B Natriuretic Peptide: 167.6 pg/mL — ABNORMAL HIGH (ref 0.0–100.0)

## 2023-02-15 MED ORDER — ALBUTEROL SULFATE (2.5 MG/3ML) 0.083% IN NEBU: 2.5000 mg | INHALATION_SOLUTION | Freq: Four times a day (QID) | RESPIRATORY_TRACT | Status: DC | PRN

## 2023-02-15 MED ORDER — MELATONIN 5 MG PO TABS
5.0000 mg | ORAL_TABLET | Freq: Every evening | ORAL | Status: DC | PRN
  Administered 2023-02-15 – 2023-02-20 (×5): 5 mg via ORAL
  Filled 2023-02-15 (×5): qty 1

## 2023-02-15 MED ORDER — CARVEDILOL 3.125 MG PO TABS
3.1250 mg | ORAL_TABLET | Freq: Two times a day (BID) | ORAL | Status: DC
  Administered 2023-02-16 – 2023-02-21 (×11): 3.125 mg via ORAL
  Filled 2023-02-15 (×11): qty 1

## 2023-02-15 MED ORDER — ONDANSETRON HCL 4 MG PO TABS: 4.0000 mg | ORAL_TABLET | Freq: Four times a day (QID) | ORAL | Status: DC | PRN

## 2023-02-15 MED ORDER — FLUTICASONE PROPIONATE 50 MCG/ACT NA SUSP: 2.0000 | Freq: Every day | NASAL | Status: DC | PRN

## 2023-02-15 MED ORDER — AMLODIPINE BESYLATE 2.5 MG PO TABS
2.5000 mg | ORAL_TABLET | Freq: Every day | ORAL | Status: DC
  Administered 2023-02-16 – 2023-02-21 (×6): 2.5 mg via ORAL
  Filled 2023-02-15 (×6): qty 1

## 2023-02-15 MED ORDER — ONDANSETRON HCL 4 MG/2ML IJ SOLN: 4.0000 mg | Freq: Four times a day (QID) | INTRAMUSCULAR | Status: DC | PRN

## 2023-02-15 MED ORDER — MAGNESIUM SULFATE 2 GM/50ML IV SOLN
2.0000 g | Freq: Once | INTRAVENOUS | Status: AC
  Administered 2023-02-15: 2 g via INTRAVENOUS
  Filled 2023-02-15: qty 50

## 2023-02-15 MED ORDER — ALPRAZOLAM 0.5 MG PO TABS: 0.5000 mg | ORAL_TABLET | Freq: Two times a day (BID) | ORAL | Status: DC | PRN

## 2023-02-15 MED ORDER — CALCIUM CARBONATE 1250 (500 CA) MG PO TABS
600.0000 mg | ORAL_TABLET | Freq: Every day | ORAL | Status: DC
  Administered 2023-02-16 – 2023-02-21 (×6): 625 mg via ORAL
  Filled 2023-02-15 (×6): qty 1

## 2023-02-15 MED ORDER — ALBUTEROL SULFATE HFA 108 (90 BASE) MCG/ACT IN AERS: 2.0000 | INHALATION_SPRAY | RESPIRATORY_TRACT | Status: DC | PRN

## 2023-02-15 MED ORDER — ASPIRIN 81 MG PO TBEC
81.0000 mg | DELAYED_RELEASE_TABLET | Freq: Every day | ORAL | Status: DC
  Administered 2023-02-16 – 2023-02-21 (×6): 81 mg via ORAL
  Filled 2023-02-15 (×6): qty 1

## 2023-02-15 MED ORDER — PREDNISONE 20 MG PO TABS
40.0000 mg | ORAL_TABLET | Freq: Every day | ORAL | Status: DC
  Administered 2023-02-16 – 2023-02-18 (×3): 40 mg via ORAL
  Filled 2023-02-15 (×3): qty 2

## 2023-02-15 MED ORDER — DIGOXIN 125 MCG PO TABS
0.1250 mg | ORAL_TABLET | Freq: Every day | ORAL | Status: DC
  Administered 2023-02-16 – 2023-02-21 (×6): 0.125 mg via ORAL
  Filled 2023-02-15 (×6): qty 1

## 2023-02-15 MED ORDER — UMECLIDINIUM BROMIDE 62.5 MCG/ACT IN AEPB
1.0000 | INHALATION_SPRAY | Freq: Every day | RESPIRATORY_TRACT | Status: DC
  Administered 2023-02-16 – 2023-02-17 (×2): 1 via RESPIRATORY_TRACT
  Filled 2023-02-15 (×2): qty 7

## 2023-02-15 MED ORDER — SERTRALINE HCL 50 MG PO TABS
50.0000 mg | ORAL_TABLET | Freq: Every day | ORAL | Status: DC
  Administered 2023-02-15 – 2023-02-20 (×6): 50 mg via ORAL
  Filled 2023-02-15 (×6): qty 1

## 2023-02-15 MED ORDER — SODIUM CHLORIDE 0.9 % IV SOLN: INTRAVENOUS | Status: DC | PRN

## 2023-02-15 MED ORDER — BUDESON-GLYCOPYRROL-FORMOTEROL 160-9-4.8 MCG/ACT IN AERO: 2.0000 | INHALATION_SPRAY | Freq: Two times a day (BID) | RESPIRATORY_TRACT | Status: DC

## 2023-02-15 MED ORDER — FLUTICASONE FUROATE-VILANTEROL 200-25 MCG/ACT IN AEPB
1.0000 | INHALATION_SPRAY | Freq: Every day | RESPIRATORY_TRACT | Status: DC
  Administered 2023-02-16 – 2023-02-17 (×2): 1 via RESPIRATORY_TRACT
  Filled 2023-02-15 (×2): qty 28

## 2023-02-15 MED ORDER — ACETAMINOPHEN 650 MG RE SUPP: 650.0000 mg | Freq: Four times a day (QID) | RECTAL | Status: DC | PRN

## 2023-02-15 MED ORDER — PANTOPRAZOLE SODIUM 40 MG PO TBEC
40.0000 mg | DELAYED_RELEASE_TABLET | Freq: Two times a day (BID) | ORAL | Status: DC
  Administered 2023-02-15 – 2023-02-21 (×12): 40 mg via ORAL
  Filled 2023-02-15 (×12): qty 1

## 2023-02-15 MED ORDER — VITAMIN C 500 MG PO TABS
1000.0000 mg | ORAL_TABLET | Freq: Every day | ORAL | Status: DC
  Administered 2023-02-16 – 2023-02-21 (×6): 1000 mg via ORAL
  Filled 2023-02-15 (×6): qty 2

## 2023-02-15 MED ORDER — ACETAMINOPHEN 325 MG PO TABS: 650.0000 mg | ORAL_TABLET | Freq: Four times a day (QID) | ORAL | Status: DC | PRN

## 2023-02-15 MED ORDER — METHYLPREDNISOLONE SODIUM SUCC 125 MG IJ SOLR
125.0000 mg | Freq: Once | INTRAMUSCULAR | Status: AC
  Administered 2023-02-15: 125 mg via INTRAVENOUS
  Filled 2023-02-15: qty 2

## 2023-02-15 MED ORDER — IPRATROPIUM-ALBUTEROL 0.5-2.5 (3) MG/3ML IN SOLN
3.0000 mL | RESPIRATORY_TRACT | Status: AC
  Administered 2023-02-15 (×3): 3 mL via RESPIRATORY_TRACT
  Filled 2023-02-15: qty 9

## 2023-02-15 MED ORDER — VITAMIN D 25 MCG (1000 UNIT) PO TABS
2000.0000 [IU] | ORAL_TABLET | Freq: Every day | ORAL | Status: DC
  Administered 2023-02-16 – 2023-02-21 (×6): 2000 [IU] via ORAL
  Filled 2023-02-15 (×6): qty 2

## 2023-02-15 MED ORDER — ATORVASTATIN CALCIUM 40 MG PO TABS
40.0000 mg | ORAL_TABLET | Freq: Every day | ORAL | Status: DC
  Administered 2023-02-15 – 2023-02-20 (×6): 40 mg via ORAL
  Filled 2023-02-15 (×6): qty 1

## 2023-02-15 MED ORDER — ENOXAPARIN SODIUM 40 MG/0.4ML IJ SOSY
40.0000 mg | PREFILLED_SYRINGE | INTRAMUSCULAR | Status: DC
  Administered 2023-02-15 – 2023-02-20 (×6): 40 mg via SUBCUTANEOUS
  Filled 2023-02-15 (×6): qty 0.4

## 2023-02-15 MED ORDER — FUROSEMIDE 10 MG/ML IJ SOLN
40.0000 mg | Freq: Once | INTRAMUSCULAR | Status: AC
  Administered 2023-02-15: 40 mg via INTRAVENOUS
  Filled 2023-02-15: qty 4

## 2023-02-15 NOTE — H&P (Addendum)
History and Physical    Paul [name] [id number] DOB: 1946/03/14 DOA: 02/15/2023  I have briefly reviewed the patient's prior medical records in [place]  PCP: [name], MD  Patient coming from: [facility]  Chief Complaint: SOB  HPI: Paul [name] is a 77 y.o. male with medical history significant of with h/o anemia, a fib, COPD who was recently hospitalized x3 since the end of JANUARY 2024.   1/25: came in for SOB due to COPD exacerbation- sent on continuous 2L O2 for 2 weeks 2/3: acute COPD  exacerbation and a fib with RVR- seems to have been placed on continuous O2 2L again [id number]: acute diastolic CHF, COPD and p a fib-- started on lasix 2/24-- back again for similar complaints-- for last 24 hours has been getting more SOB.  Denies fever, denies chills, denies sick contacts-- does say he has been wheezing.  He was placed on bipap and given IV steroids with improvement.  He does endorse LE edema but there was none on exam.  His weight is up 3lbs per patient.  Denies salt intake  Currently requesting not to be sent home from hospital "too soon"  Review of Systems: As per HPI otherwise 10 point review of systems negative.   Past Medical History:  Diagnosis Date   Anemia    Aortic atherosclerosis (HCC)    Aspiration pneumonia due to inhalation of vomitus ([place]) 02/03/2022   Atrial fibrillation (HCC)    Back pain    COPD (chronic obstructive pulmonary disease) (HCC)    COPD exacerbation ([place]) 12/27/2021   COVID-19 12/24/2020   Diabetes mellitus without complication (HCC)    Dizziness 04/08/2022   Elevated PSA    GAD (generalized anxiety disorder)    GERD (gastroesophageal reflux disease)    High cholesterol    Hypertension    Lingular pneumonia 01/05/2021   See cxr 01/06/20 rx with zpak/ cefipime x 5 days and then developed purulent sputum again 01/12/21 so rx levcaquin 500 mg daily x 7 days then return for cxr before more    Lung nodule < 6cm on CT 05/29/2016    Osteoporosis    Oxygen deficiency    Pneumonia    January 2022   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 Quentin Ore with a 20 mm Watchman FLX device   RSV bronchitis 01/27/2022   Vertebral compression fracture ([place]) 05/29/2019   T8 compression fracture noted on CT scan 05/28/2019 inpatient with chronic steroid dependent COPD - rx calcitonin nasal spray rx per PCP     Past Surgical History:  Procedure Laterality Date   CATARACT EXTRACTION Right    ELBOW SURGERY  07/2022   surgery on olecranon bursa. Dr. Greta Doom   IR KYPHO EA ADDL LEVEL THORACIC OR LUMBAR  11/28/2021   LEFT ATRIAL APPENDAGE OCCLUSION N/A 08/02/2021   Procedure: LEFT ATRIAL APPENDAGE OCCLUSION;  Surgeon: Sherren Mocha, MD;  Location: [place] [facility];  Service: Cardiovascular;  Laterality: N/A;   SKIN SURGERY     shoulders and chest   TEE WITHOUT CARDIOVERSION N/A 08/02/2021   Procedure: TRANSESOPHAGEAL ECHOCARDIOGRAM (TEE);  Surgeon: Sherren Mocha, MD;  Location: [place] [facility];  Service: Cardiovascular;  Laterality: N/A;   TEE WITHOUT CARDIOVERSION N/A 09/13/2021   Procedure: TRANSESOPHAGEAL ECHOCARDIOGRAM (TEE);  Surgeon: Sanda Klein, MD;  Location: [facility] ENDOS[facility];  Service: Cardiovascular;  Laterality: N/A;     reports that he quit smoking about 14 years ago.  His smoking use included cigarettes. He has a 104.00 pack-year smoking history. He has never used smokeless tobacco. He reports that he does not currently use alcohol. He reports that he does not use drugs.  Allergies  Allergen Reactions   Tape Other (See Comments)    PLEASE USE COBAN WRAP IN LIEU OF "TAPE," as it "pulls off the skin!!   Daliresp [Roflumilast] Diarrhea and Nausea And Vomiting   Levaquin [Levofloxacin] Palpitations and Other (See Comments)    Made the B/P fluctuate and heart raced   Alendronate Anxiety and Other (See Comments)    Jittery/nervous    Family History  Problem Relation Age  of Onset   Heart disease Brother    Heart disease Mother    Heart disease Father    Cancer Paternal Uncle     Prior to Admission medications   Medication Sig Start Date End Date Taking? Authorizing Provider  acetaminophen (TYLENOL) 500 MG tablet Take 1,000 mg by mouth at bedtime as needed (pain).    [provider]  albuterol (PROAIR HFA) 108 (90 Base) MCG/ACT inhaler Inhale 2 puffs into the lungs as needed for wheezing or shortness of breath.    [provider]  albuterol (PROVENTIL) (2.5 MG/3ML) 0.083% nebulizer solution Take 3 mLs (2.5 mg total) by nebulization every 6 (six) hours as needed for shortness of breath or wheezing. 10/23/22   Parrett, Fonnie Mu, NP  ALPRAZolam (XANAX) 0.5 MG tablet Take 1 tablet (0.5 mg total) by mouth 2 (two) times daily as needed for anxiety. 07/25/22   Cox, Elnita Maxwell, MD  amLODipine (NORVASC) 2.5 MG tablet Take 1 tablet (2.5 mg total) by mouth daily. 02/11/23 02/06/24  Trudi Ida, NP  Ascorbic Acid (VITAMIN C) 1000 MG tablet Take 1,000 mg by mouth daily.    [provider]  aspirin EC 81 MG tablet Take 1 tablet (81 mg total) by mouth daily. Swallow whole. 01/28/22   Tommie Raymond, NP  atorvastatin (LIPITOR) 40 MG tablet TAKE 1 TABLET BY MOUTH EVERYDAY AT BEDTIME Patient taking differently: Take 40 mg by mouth at bedtime. 11/26/22   Marge Duncans, PA-C  Budeson-Glycopyrrol-Formoterol (BREZTRI AEROSPHERE) 160-9-4.8 MCG/ACT AERO Inhale 2 puffs into the lungs 2 (two) times daily. 01/02/23   Cox, Elnita Maxwell, MD  calcium carbonate (OS-CAL) 600 MG TABS tablet Take 600 mg by mouth daily.    [provider]  carvedilol (COREG) 3.125 MG tablet Take 1 tablet (3.125 mg total) by mouth 2 (two) times daily with a meal. 02/11/23 02/06/24  Trudi Ida, NP  Cholecalciferol (VITAMIN D3) 50 MCG (2000 UT) TABS Take 2,000 Units by mouth in the morning.    [provider]  denosumab (PROLIA) 60 MG/ML SOSY injection Inject 60 mg into the skin  every 6 (six) months.    [provider]  digoxin (LANOXIN) 0.125 MG tablet Take 1 tablet (0.125 mg total) by mouth daily. 02/08/23 05/09/23  [place] Donnamarie Poag, DO  ferrous sulfate 325 (65 FE) MG tablet Take 325 mg by mouth 2 (two) times daily with a meal.    [provider]  fluticasone (FLONASE) 50 MCG/ACT nasal spray Place 2 sprays into both nostrils daily as needed for allergies or rhinitis.    [provider]  furosemide (LASIX) 40 MG tablet Take 1 tablet (40 mg total) by mouth 2 (two) times daily. 02/07/23 05/08/23  [place] Donnamarie Poag, DO  loratadine (CLARITIN) 10 MG tablet Take 10 mg by mouth in the morning.  [provider]  Melatonin 12 MG TABS Take 12 mg by mouth at bedtime.    [provider]  Multiple Vitamin (MULTIVITAMIN WITH MINERALS) TABS tablet Take 1 tablet by mouth daily with breakfast.    [provider]  OXYGEN Inhale 2 L/min into the lungs at bedtime.    [provider]  pantoprazole (PROTONIX) 40 MG tablet Take 1 tablet (40 mg total) by mouth 2 (two) times daily. 11/06/22   Tanda Rockers, MD  Probiotic Product (PROBIOTIC DAILY) CAPS Take 1 capsule by mouth in the morning.    [provider]  sertraline (ZOLOFT) 50 MG tablet Take 1 tablet (50 mg total) by mouth at bedtime. 09/16/22   [name], MD    Physical Exam: Vitals:   02/15/23 1329 02/15/23 1500 02/15/23 1600 02/15/23 1704  BP:  (!) 134/59 (!) 152/70 (!) 165/73  Pulse:  75 80 81  Resp:  '20 12 12  '$ Temp:    98.6 F (37 C)  TempSrc:    Oral  SpO2:  99% 100%   Weight: 68.5 kg     Height:          Constitutional: NAD, calm, comfortable- on bipap Eyes: PERRL, lids and conjunctivae normal ENMT: Mucous membranes are moist. Posterior pharynx clear of any exudate or lesions.Normal dentition.  Neck: normal, supple, no masses, no thyromegaly Respiratory: on bipap, diminished, no wheezing Cardiovascular: Regular rate and rhythm, no murmurs / rubs /  gallops. No extremity edema. 2+ pedal pulses.  Abdomen: no tenderness, no masses palpated. Bowel sounds positive.  Musculoskeletal: no clubbing / cyanosis. Normal muscle tone.  Skin: no rashes, lesions, ulcers. No induration Neurologic: CN 2-12 grossly intact. Strength 5/5 in all 4.  Psychiatric: Normal judgment and insight. Alert and oriented x 3. Normal mood.   Labs on Admission: I have personally reviewed following labs and imaging studies  CBC: Recent Labs  Lab 02/15/23 1404  WBC 17.8*  NEUTROABS 16.0*  HGB 11.5*  HCT 35.9*  MCV 81.0  PLT 99991111   Basic Metabolic Panel: Recent Labs  Lab 02/12/23 1416 02/15/23 1404  NA 139 132*  K 4.5 3.5  CL 88* 88*  CO2 29 35*  GLUCOSE 136* 188*  BUN 23 19  CREATININE 1.05 0.93  CALCIUM 9.0 8.0*   GFR: Estimated Creatinine Clearance: 60 mL/min (by C-G formula based on SCr of 0.93 mg/dL). Liver Function Tests: Recent Labs  Lab 02/12/23 1416 02/15/23 1404  AST 26 30  ALT 42 38  ALKPHOS 95 70  BILITOT 0.3 0.7  PROT 5.9* 6.6  ALBUMIN 3.9 3.1*   No results for input(s): "LIPASE", "AMYLASE" in the last 168 hours. No results for input(s): "AMMONIA" in the last 168 hours. Coagulation Profile: No results for input(s): "INR", "PROTIME" in the last 168 hours. Cardiac Enzymes: No results for input(s): "CKTOTAL", "CKMB", "CKMBINDEX", "TROPONINI" in the last 168 hours. BNP (last 3 results) No results for input(s): "PROBNP" in the last 8760 hours. HbA1C: No results for input(s): "HGBA1C" in the last 72 hours. CBG: No results for input(s): "GLUCAP" in the last 168 hours. Lipid Profile: No results for input(s): "CHOL", "HDL", "LDLCALC", "TRIG", "CHOLHDL", "LDLDIRECT" in the last 72 hours. Thyroid Function Tests: No results for input(s): "TSH", "T4TOTAL", "FREET4", "T3FREE", "THYROIDAB" in the last 72 hours. Anemia Panel: No results for input(s): "VITAMINB12", "FOLATE", "FERRITIN", "TIBC", "IRON", "RETICCTPCT" in the last 72  hours. Urine analysis:    Component Value Date/Time   COLORURINE YELLOW 01/25/2023 2000  APPEARANCEUR CLEAR 01/25/2023 2000   LABSPEC 1.013 01/25/2023 2000   PHURINE 6.0 01/25/2023 2000   GLUCOSEU NEGATIVE 01/25/2023 2000   HGBUR NEGATIVE 01/25/2023 2000   BILIRUBINUR NEGATIVE 01/25/2023 2000   BILIRUBINUR Negative 05/10/2021 [place] 01/25/2023 2000   PROTEINUR NEGATIVE 01/25/2023 2000   UROBILINOGEN negative (A) 05/10/2021 0931   NITRITE NEGATIVE 01/25/2023 2000   LEUKOCYTESUR NEGATIVE 01/25/2023 2000     Radiological Exams on Admission: DG Chest Portable 1 View  Result Date: 02/15/2023 CLINICAL DATA:  Chest pain and shortness of breath for 2 days. EXAM: PORTABLE CHEST 1 VIEW COMPARISON:  None Available. FINDINGS: The heart size and mediastinal contours are within normal limits. Aortic atherosclerotic calcification incidentally noted. Both lungs are clear. Pulmonary hyperinflation again seen, consistent with COPD. IMPRESSION: COPD. No active disease. Electronically Signed   By: Marlaine Hind M.D.   On: 02/15/2023 14:27     Assessment/Plan Active Problems:   COPD (chronic obstructive pulmonary disease) (HCC)  Acute respiratory failure -wears 2L O2 sats only at night -currently on BIPAP -will attempt to wean off BIPAP to [place] -suspect more of CHF but if it is COPD this is his 4th hospitalization in as many weeks for a very similar reason--could get pulmonary consult in the AM to weigh in on why he keeps coming back with the same symptoms-- does he need chronic steroids?  He was actually seen by Dr. Melvyn Novas 2/19 but unclear if he made any changes -he is only wearing his O2 at night and not sure if he should wear 24/7...  COPD exacerbation? -follows with Dr. Melvyn Novas -per patient he was "wheezy" and tight at OSH and appears to have improved with IV steroids -PO steroids in AM -check procalcitonin level and add abx if needed -nebs -negative  flu/covid/RSV   Paroxysmal atrial fibrillation  - s/p Watchman in 2022 was noted to be in correct placement with no leaking on TEE in September 2022.  -intolerant of beta-blocker secondary to syncopal episodes and falls. -CHA2DS2-VASc Score = 6     Acute on Chronic diastolic heart failure -echo on 01/26/2023 shows EF 65 to 70%, grade 1 DD.  He is weighing himself daily-- weight up this AM by 3 lbs -IV lasix x 1 -strict I/Os and daily weights  Coronary artery calcification - Stable with no anginal symptoms. No indication for ischemic evaluation.  Continue aspirin, carvedilol.  Hypertension  -recently started  on amlodipine 2.5 mg daily.    Hyponatremia -mild -daily labs  DVT prophylaxis: lovenox  Code Status: full  Family Communication: none Disposition Plan: home in 1-2 days if improved Consults called: none    Admission status: already placed as an inpatient when I saw him   [place] Hospital[facility]   How to contact the [facility] Attending or Consulting provider [place] or covering provider during after hours [place], for this patient?  Check the care team in [facility] and look for a) attending/consulting [facility] provider listed and b) the [facility] team listed Log into [email or web address] and use [place]'s universal password to access. If you do not have the password, please contact the hospital operator. Locate the [facility] provider you are looking for under [facility] and page to a number that you can be directly reached. If you still have difficulty reaching the provider, please page the [facility] (Director on Call) for the Hospitalists listed on amion for assistance.  02/15/2023, 5:27 PM

## 2023-02-15 NOTE — ED Notes (Signed)
Called [facility] for transport at 3:15.

## 2023-02-15 NOTE — ED Provider Notes (Addendum)
[place] EMERGENCY DEPARTMENT AT [facility] [place] Provider Note   CSN: [id number] Arrival date & time: 02/15/23  1317     History  Chief Complaint  Patient presents with   Chest Pain    HARDING MCGOURTY is a 77 y.o. male.  Patient here with shortness of breath and chest pain.  History of COPD, hypertension, high cholesterol, atrial fibrillation on anticoagulation.  Has had 3 hospital admissions this month for various respiratory and chest pain workups.  Mostly today he is having shortness of breath cannot take a deep breath without having a lot of trouble.  He is wearing his oxygen and despite wearing his oxygen and he is having some low oxygen levels.  Oxygen is not helping him breathe.  He has not taken a breathing treatment.  He is on a steroid taper currently 50 mg his his dose recently.  He feels like it is a COPD exacerbation.  Maybe a little bit of swelling in his legs.  Denies any cough or sputum production or fever or chills.  The history is provided by the patient.       Home Medications Prior to Admission medications   Medication Sig Start Date End Date Taking? Authorizing Provider  acetaminophen (TYLENOL) 500 MG tablet Take 1,000 mg by mouth at bedtime as needed (pain).    [provider]  albuterol (PROAIR HFA) 108 (90 Base) MCG/ACT inhaler Inhale 2 puffs into the lungs as needed for wheezing or shortness of breath.    [provider]  albuterol (PROVENTIL) (2.5 MG/3ML) 0.083% nebulizer solution Take 3 mLs (2.5 mg total) by nebulization every 6 (six) hours as needed for shortness of breath or wheezing. 10/23/22   Parrett, Fonnie Mu, NP  ALPRAZolam (XANAX) 0.5 MG tablet Take 1 tablet (0.5 mg total) by mouth 2 (two) times daily as needed for anxiety. 07/25/22   Cox, Elnita Maxwell, MD  amLODipine (NORVASC) 2.5 MG tablet Take 1 tablet (2.5 mg total) by mouth daily. 02/11/23 02/06/24  Trudi Ida, NP  Ascorbic Acid (VITAMIN C) 1000 MG tablet Take 1,000 mg by  mouth daily.    [provider]  aspirin EC 81 MG tablet Take 1 tablet (81 mg total) by mouth daily. Swallow whole. 01/28/22   Tommie Raymond, NP  atorvastatin (LIPITOR) 40 MG tablet TAKE 1 TABLET BY MOUTH EVERYDAY AT BEDTIME Patient taking differently: Take 40 mg by mouth at bedtime. 11/26/22   Marge Duncans, PA-C  Budeson-Glycopyrrol-Formoterol (BREZTRI AEROSPHERE) 160-9-4.8 MCG/ACT AERO Inhale 2 puffs into the lungs 2 (two) times daily. 01/02/23   Cox, Elnita Maxwell, MD  calcium carbonate (OS-CAL) 600 MG TABS tablet Take 600 mg by mouth daily.    [provider]  carvedilol (COREG) 3.125 MG tablet Take 1 tablet (3.125 mg total) by mouth 2 (two) times daily with a meal. 02/11/23 02/06/24  Trudi Ida, NP  Cholecalciferol (VITAMIN D3) 50 MCG (2000 UT) TABS Take 2,000 Units by mouth in the morning.    [provider]  denosumab (PROLIA) 60 MG/ML SOSY injection Inject 60 mg into the skin every 6 (six) months.    [provider]  digoxin (LANOXIN) 0.125 MG tablet Take 1 tablet (0.125 mg total) by mouth daily. 02/08/23 05/09/23  [place] Donnamarie Poag, DO  ferrous sulfate 325 (65 FE) MG tablet Take 325 mg by mouth 2 (two) times daily with a meal.    [provider]  fluticasone (FLONASE) 50 MCG/ACT nasal spray Place 2 sprays into  both nostrils daily as needed for allergies or rhinitis.    [provider]  furosemide (LASIX) 40 MG tablet Take 1 tablet (40 mg total) by mouth 2 (two) times daily. 02/07/23 05/08/23  [place] Donnamarie Poag, DO  loratadine (CLARITIN) 10 MG tablet Take 10 mg by mouth in the morning.    [provider]  Melatonin 12 MG TABS Take 12 mg by mouth at bedtime.    [provider]  Multiple Vitamin (MULTIVITAMIN WITH MINERALS) TABS tablet Take 1 tablet by mouth daily with breakfast.    [provider]  OXYGEN Inhale 2 L/min into the lungs at bedtime.    [provider]  pantoprazole (PROTONIX) 40 MG tablet Take 1  tablet (40 mg total) by mouth 2 (two) times daily. 11/06/22   Tanda Rockers, MD  Probiotic Product (PROBIOTIC DAILY) CAPS Take 1 capsule by mouth in the morning.    [provider]  sertraline (ZOLOFT) 50 MG tablet Take 1 tablet (50 mg total) by mouth at bedtime. 09/16/22   CoxElnita Maxwell, MD      Allergies    Tape, Daliresp [roflumilast], Levaquin [levofloxacin], and Alendronate    Review of Systems   Review of Systems  Physical Exam Updated Vital Signs BP (!) 115/58   Pulse 91   Temp 98 F (36.7 C) (Oral)   Resp 16   Ht '5\' 6"'$  (1.676 m)   Wt 68.5 kg   SpO2 98%   BMI 24.37 kg/m  Physical Exam Vitals and nursing note reviewed.  Constitutional:      General: He is not in acute distress.    Appearance: He is well-developed.  HENT:     Head: Normocephalic and atraumatic.  Eyes:     Conjunctiva/sclera: Conjunctivae normal.  Cardiovascular:     Rate and Rhythm: Normal rate and regular rhythm.     Pulses:          Radial pulses are 2+ on the left side.     Heart sounds: No murmur heard. Pulmonary:     Effort: Tachypnea and respiratory distress present.     Breath sounds: Decreased breath sounds present.  Abdominal:     Palpations: Abdomen is soft.     Tenderness: There is no abdominal tenderness.  Musculoskeletal:        General: No swelling.     Cervical back: Neck supple.     Right lower leg: Edema present.     Left lower leg: Edema present.     Comments: Trace edema in legs bilaterally  Skin:    General: Skin is warm and dry.     Capillary Refill: Capillary refill takes less than 2 seconds.  Neurological:     General: No focal deficit present.     Mental Status: He is alert.  Psychiatric:        Mood and Affect: Mood normal.     ED Results / Procedures / Treatments   Labs (all labs ordered are listed, but only abnormal results are displayed) Labs Reviewed  CBC WITH DIFFERENTIAL/PLATELET - Abnormal; Notable for the following components:      Result  Value   WBC 17.8 (*)    Hemoglobin 11.5 (*)    HCT 35.9 (*)    Neutro Abs 16.0 (*)    Abs Immature Granulocytes 0.12 (*)    All other components within normal limits  COMPREHENSIVE METABOLIC PANEL - Abnormal; Notable for the following components:   Sodium 132 (*)  Chloride 88 (*)    CO2 35 (*)    Glucose, Bld 188 (*)    Calcium 8.0 (*)    Albumin 3.1 (*)    All other components within normal limits  RESP PANEL BY RT-PCR (RSV, FLU A&B, COVID)  RVPGX2  BRAIN NATRIURETIC PEPTIDE  TROPONIN I (HIGH SENSITIVITY)    EKG EKG Interpretation  Date/Time:  Saturday February 15 2023 13:25:07 EST Ventricular Rate:  91 PR Interval:  122 QRS Duration: 91 QT Interval:  328 QTC Calculation: 404 R Axis:   71 Text Interpretation: Sinus rhythm Borderline ST depression, diffuse leads , seen on prior ekgs in past Confirmed by Lennice Sites ([id number]) on 02/15/2023 1:29:24 PM  Radiology DG Chest Portable 1 View  Result Date: 02/15/2023 CLINICAL DATA:  Chest pain and shortness of breath for 2 days. EXAM: PORTABLE CHEST 1 VIEW COMPARISON:  None Available. FINDINGS: The heart size and mediastinal contours are within normal limits. Aortic atherosclerotic calcification incidentally noted. Both lungs are clear. Pulmonary hyperinflation again seen, consistent with COPD. IMPRESSION: COPD. No active disease. Electronically Signed   By: Marlaine Hind M.D.   On: 02/15/2023 14:27    Procedures .Critical Care  Performed by: Lennice Sites, DO Authorized by: Lennice Sites, DO   Critical care provider statement:    Critical care time (minutes):  23   Critical care was necessary to treat or prevent imminent or life-threatening deterioration of the following conditions:  Respiratory failure   Critical care was time spent personally by me on the following activities:  Blood draw for specimens, development of treatment plan with patient or surrogate, discussions with primary provider, evaluation of patient's response  to treatment, examination of patient, obtaining history from patient or surrogate, ordering and performing treatments and interventions, ordering and review of laboratory studies, pulse oximetry, ordering and review of radiographic studies, re-evaluation of patient's condition and review of old charts   I assumed direction of critical care for this patient from another provider in my specialty: no       Medications Ordered in ED Medications  magnesium sulfate IVPB 2 g 50 mL (2 g Intravenous New Bag/Given 02/15/23 1403)  0.9 %  sodium chloride infusion ( Intravenous New Bag/Given 02/15/23 1401)  ipratropium-albuterol (DUONEB) 0.5-2.5 (3) MG/3ML nebulizer solution 3 mL (3 mLs Nebulization Given 02/15/23 1416)  methylPREDNISolone sodium succinate (SOLU-MEDROL) 125 mg/2 mL injection 125 mg (125 mg Intravenous Given 02/15/23 1355)    ED Course/ Medical Decision Making/ A&P                             Medical Decision Making Amount and/or Complexity of Data Reviewed Labs: ordered. Radiology: ordered.  Risk Prescription drug management. Decision regarding hospitalization.   NEKO KOMISAR is here shortness of breath and chest pain.  Normal vitals.  No fever.  Increased work of breathing, diminished breath sounds, respiratory distress upon arrival.  Placed on BiPAP.  Sounds like he has had admission for heart failure and COPD and chest pain here all in the last month.  He appears that differential today is likely COPD exacerbation.  He intermittently wears oxygen.  He is wearing oxygen today but still having difficulty with his breathing.  May be some swelling in his legs.  Differential diagnosis includes COPD versus less likely ACS or pneumonia or viral process.  Will put him on BiPAP give breathing treatments and steroids and magnesium.  Anticipate admission again.  Will  check troponin, CBC, CMP, BNP and troponin as well as chest x-ray.  On reevaluation patient appears to be stabilizing on BiPAP.   Feeling better after breathing treatments and magnesium and steroids.  Mild leukocytosis to 17 but less likely stress reaction from steroids as chest x-ray per my review interpretation does not show any obvious pneumonia pneumothorax.  Radiology report with the same.  There is no significant electrolyte abnormality.  Overall awaiting troponin and BNP but suspect these are less likely to be ACS or heart failure related given no signs of volume overload on exam.  He had recent admission for some cardiac workup.  He is not having any chest pain currently.  My suspicion is that this is COPD exacerbation.  Will admit to hospitalist for further care.  This chart was dictated using voice recognition software.  Despite best efforts to proofread,  errors can occur which can change the documentation meaning.      Final Clinical Impression(s) / ED Diagnoses Final diagnoses:  COPD with acute exacerbation ([place])  Acute respiratory failure with hypoxia [facility])    Rx / DC Orders ED Discharge Orders     None         Lennice Sites, DO 02/15/23 [place], Paul, DO 02/15/23 1457

## 2023-02-15 NOTE — Assessment & Plan Note (Addendum)
Management per specialist.  Continue carvedilol 3.125 mg twice daily.

## 2023-02-15 NOTE — ED Triage Notes (Signed)
Pt reports chest heaviness, back/side pain, and shortness of breath for the past 2 days/started last night. Recent hospitalization for CHF and feels like same. Has been taking his home medications.

## 2023-02-15 NOTE — Assessment & Plan Note (Signed)
Continue Lasix 40 mg p.o. twice daily. Continue digoxin at 0.125 mg once daily. Check digoxin level. Continue daily weight logs.   Check labs.

## 2023-02-16 DIAGNOSIS — J441 Chronic obstructive pulmonary disease with (acute) exacerbation: Secondary | ICD-10-CM

## 2023-02-16 DIAGNOSIS — J9601 Acute respiratory failure with hypoxia: Secondary | ICD-10-CM | POA: Diagnosis not present

## 2023-02-16 LAB — BASIC METABOLIC PANEL
Anion gap: 10 (ref 5–15)
BUN: 16 mg/dL (ref 8–23)
CO2: 35 mmol/L — ABNORMAL HIGH (ref 22–32)
Calcium: 7.9 mg/dL — ABNORMAL LOW (ref 8.9–10.3)
Chloride: 89 mmol/L — ABNORMAL LOW (ref 98–111)
Creatinine, Ser: 0.96 mg/dL (ref 0.61–1.24)
GFR, Estimated: >60 mL/min (ref >60)
Glucose, Bld: 190 mg/dL — ABNORMAL HIGH (ref 70–99)
Potassium: 3.6 mmol/L (ref 3.5–5.1)
Sodium: 134 mmol/L — ABNORMAL LOW (ref 135–145)

## 2023-02-16 LAB — CBC
HCT: 33 % — ABNORMAL LOW (ref 39.0–52.0)
Hemoglobin: 10.9 g/dL — ABNORMAL LOW (ref 13.0–17.0)
MCH: 26.3 pg (ref 26.0–34.0)
MCHC: 33 g/dL (ref 30.0–36.0)
MCV: 79.5 fL — ABNORMAL LOW (ref 80.0–100.0)
Platelets: 223 10*3/uL (ref 150–400)
RBC: 4.15 MIL/uL — ABNORMAL LOW (ref 4.22–5.81)
RDW: 14.9 % (ref 11.5–15.5)
WBC: 12.4 10*3/uL — ABNORMAL HIGH (ref 4.0–10.5)
nRBC: 0 % (ref 0.0–0.2)

## 2023-02-16 MED ORDER — ALBUTEROL SULFATE (2.5 MG/3ML) 0.083% IN NEBU: 2.5000 mg | INHALATION_SOLUTION | RESPIRATORY_TRACT | Status: DC | PRN

## 2023-02-16 MED ORDER — FUROSEMIDE 40 MG PO TABS
60.0000 mg | ORAL_TABLET | Freq: Two times a day (BID) | ORAL | Status: DC
  Administered 2023-02-16 – 2023-02-17 (×3): 60 mg via ORAL
  Filled 2023-02-16 (×3): qty 1

## 2023-02-16 MED ORDER — ALBUTEROL SULFATE (2.5 MG/3ML) 0.083% IN NEBU: 2.5000 mg | INHALATION_SOLUTION | Freq: Four times a day (QID) | RESPIRATORY_TRACT | Status: DC

## 2023-02-16 NOTE — Assessment & Plan Note (Signed)
Improved. Back on 2 L oxygen PNC.

## 2023-02-16 NOTE — Assessment & Plan Note (Addendum)
Greatly improved.  Continue current inhalers. Complete doxycycline. Continue to taper prednisone slowly.

## 2023-02-16 NOTE — Progress Notes (Signed)
PROGRESS NOTE    Paul [name]  [id number] DOB: 1946/10/25 DOA: 02/15/2023 PCP: [name], MD  Chief Complaint  Patient presents with   Chest Pain    Brief Narrative:   Paul [name] is Paul [name] 77 y.o. male with medical history significant of with h/o anemia, Dorota Heinrichs fib, COPD who was recently hospitalized x3 since the end of JANUARY 2024.   1/25: came in for SOB due to COPD exacerbation- sent on continuous 2L O2 for 2 weeks 2/3: acute COPD  exacerbation and Paul [name] fib with RVR- seems to have been placed on continuous O2 2L again [id number]: acute diastolic CHF, COPD and p Paul [name] fib-- started on lasix 2/24-- back again for similar complaints-- for last 24 hours has been getting more SOB.  Denies fever, denies chills, denies sick contacts-- does say he has been wheezing.  He was placed on bipap and given IV steroids with improvement.  He does endorse LE edema but there was none on exam.  His weight is up 3lbs per patient.  Denies salt intake    Assessment & Plan:   Active Problems:   COPD (chronic obstructive pulmonary disease) (HCC)  Acute respiratory failure  -wears 2L O2 sats only at night -suspect due to HF and COPD exacerbations  Acute on Chronic Diastolic Heart Failure Trace edema to LE's and lower back CXR without acute findings BNP elevated above normal Will increase lasix to 60 mg PO BID from 40 mg and see how he does with this - may discharge on this higher dose Strict I/O, daily weights  COPD Exacerbation Diminished air movement, expiratory wheezing Steroids, scheduled and prn nebs Continue PO steroids for now Negative procal Negative flu, covid, RSV Seen by Dr. Melvyn Novas 2/19  Hyponatremia Suspect hypervolemic Follow with diuresis  Paroxysmal atrial fibrillation  - s/p Watchman in 2022 was noted to be in correct placement with no leaking on TEE in September 2022.  -intolerant of beta-blocker secondary to syncopal episodes and falls. -CHA2DS2-VASc Score = 6      Coronary artery  calcification - Stable with no anginal symptoms. No indication for ischemic evaluation.  Continue aspirin, carvedilol.   Hypertension  -recently started  on amlodipine 2.5 mg daily.      DVT prophylaxis: lovenox Code Status: full Family Communication: none Disposition:   Status is: Inpatient Remains inpatient appropriate because: continued SOB needing inpatient treatment   Consultants:  none  Procedures:  non  Antimicrobials:  Anti-infectives (From admission, onward)    None       Subjective: C/o SOB Needs his inhalers  Objective: Vitals:   02/16/23 0027 02/16/23 0537 02/16/23 0753 02/16/23 1243  BP: 137/62 (!) 141/64 (!) 141/56 (!) 132/51  Pulse: 73 72 79 76  Resp: '16 18 17 17  '$ Temp: 98 F (36.7 C) 97.8 F (36.6 C) 98 F (36.7 C) 98.6 F (37 C)  TempSrc: Oral Oral Oral Oral  SpO2: 99% 100% 95% 92%  Weight:   70.4 kg   Height:        Intake/Output Summary (Last 24 hours) at 02/16/2023 1638 Last data filed at 02/16/2023 1100 Gross per 24 hour  Intake 240 ml  Output 1475 ml  Net -1235 ml   Filed Weights   02/15/23 1329 02/16/23 0753  Weight: 68.5 kg 70.4 kg    Examination:  General exam: Appears calm and comfortable  Respiratory system: tight, expiratory wheezing Cardiovascular system: S1 & S2 heard, RRR Gastrointestinal system: Abdomen is nondistended, soft and nontender.  Central nervous system: Alert and oriented. No focal neurological deficits. Extremities: trace LE edema, dependent edema to lower back  Data Reviewed: I have personally reviewed following labs and imaging studies  CBC: Recent Labs  Lab 02/15/23 1404 02/16/23 0204  WBC 17.8* 12.4*  NEUTROABS 16.0*  --   HGB 11.5* 10.9*  HCT 35.9* 33.0*  MCV 81.0 79.5*  PLT 265 [id number]    Basic Metabolic Panel: Recent Labs  Lab 02/12/23 1416 02/15/23 1404 02/16/23 0204  NA 139 132* 134*  K 4.5 3.5 3.6  CL 88* 88* 89*  CO2 29 35* 35*  GLUCOSE 136* 188* 190*  BUN '23 19 16   '$ CREATININE 1.05 0.93 0.96  CALCIUM 9.0 8.0* 7.9*    GFR: Estimated Creatinine Clearance: 58.2 mL/min (by C-G formula based on SCr of 0.96 mg/dL).  Liver Function Tests: Recent Labs  Lab 02/12/23 1416 02/15/23 1404  AST 26 30  ALT 42 38  ALKPHOS 95 70  BILITOT 0.3 0.7  PROT 5.9* 6.6  ALBUMIN 3.9 3.1*    CBG: No results for input(s): "GLUCAP" in the last 168 hours.   Recent Results (from the past 240 hour(s))  Resp panel by RT-PCR (RSV, Flu Adnan Vanvoorhis&B, Covid) Anterior Nasal Swab     Status: None   Collection Time: 02/15/23  2:04 PM   Specimen: Anterior Nasal Swab  Result Value Ref Range Status   SARS Coronavirus 2 by RT PCR NEGATIVE NEGATIVE Final    Comment: (NOTE) SARS-CoV-2 target nucleic acids are NOT DETECTED.  The SARS-CoV-2 RNA is generally detectable in upper respiratory specimens during the acute phase of infection. The lowest concentration of SARS-CoV-2 viral copies this assay can detect is 138 copies/mL. Kameah Rawl negative result does not preclude SARS-Cov-2 infection and should not be used as the sole basis for treatment or other patient management decisions. Stace Peace negative result may occur with  improper specimen collection/handling, submission of specimen other than nasopharyngeal swab, presence of viral mutation(s) within the areas targeted by this assay, and inadequate number of viral copies(<138 copies/mL). Ophelia Sipe negative result must be combined with clinical observations, patient history, and epidemiological information. The expected result is Negative.  Fact Sheet for Patients:  EntrepreneurPulse.com.au  Fact Sheet for Healthcare Providers:  IncredibleEmployment.be  This test is no t yet approved or cleared by the [place] FDA and  has been authorized for detection and/or diagnosis of SARS-CoV-2 by FDA under an Emergency Use Authorization (EUA). This EUA will remain  in effect (meaning this test can be used) for the duration  of the COVID-19 declaration under Section 564(b)(1) of the Act, 21 U.S.C.section 360bbb-3(b)(1), unless the authorization is terminated  or revoked sooner.       Influenza Tanielle Emigh by PCR NEGATIVE NEGATIVE Final   Influenza B by PCR NEGATIVE NEGATIVE Final    Comment: (NOTE) The Xpert Xpress SARS-CoV-2/FLU/RSV plus assay is intended as an aid in the diagnosis of influenza from Nasopharyngeal swab specimens and should not be used as [place] sole basis for treatment. Nasal washings and aspirates are unacceptable for Xpert Xpress SARS-CoV-2/FLU/RSV testing.  Fact Sheet for Patients: EntrepreneurPulse.com.au  Fact Sheet for Healthcare Providers: IncredibleEmployment.be  This test is not yet approved or cleared by the [place] FDA and has been authorized for detection and/or diagnosis of SARS-CoV-2 by FDA under an Emergency Use Authorization (EUA). This EUA will remain in effect (meaning this test can be used) for the duration of the COVID-19 declaration under Section 564(b)(1) of the Act, 21 U.S.C.  section 360bbb-3(b)(1), unless the authorization is terminated or revoked.     Resp Syncytial Virus by PCR NEGATIVE NEGATIVE Final    Comment: (NOTE) Fact Sheet for Patients: EntrepreneurPulse.com.au  Fact Sheet for Healthcare Providers: IncredibleEmployment.be  This test is not yet approved or cleared by the [place] FDA and has been authorized for detection and/or diagnosis of SARS-CoV-2 by FDA under an Emergency Use Authorization (EUA). This EUA will remain in effect (meaning this test can be used) for the duration of the COVID-19 declaration under Section 564(b)(1) of the Act, 21 U.S.C. section 360bbb-3(b)(1), unless the authorization is terminated or revoked.  Performed at [facility], [address], [place], [place] [address]          Radiology Studies: DG Chest Portable 1  View  Result Date: 02/15/2023 CLINICAL DATA:  Chest pain and shortness of breath for 2 days. EXAM: PORTABLE CHEST 1 VIEW COMPARISON:  None Available. FINDINGS: The heart size and mediastinal contours are within normal limits. Aortic atherosclerotic calcification incidentally noted. Both lungs are clear. Pulmonary hyperinflation again seen, consistent with COPD. IMPRESSION: COPD. No active disease. Electronically Signed   By: Marlaine Hind M.D.   On: 02/15/2023 14:27        Scheduled Meds:  amLODipine  2.5 mg Oral Daily   vitamin C  1,000 mg Oral Daily   aspirin EC  81 mg Oral Daily   atorvastatin  40 mg Oral QHS   calcium carbonate  625 mg Oral Q breakfast   carvedilol  3.125 mg Oral BID WC   cholecalciferol  2,000 Units Oral Daily   digoxin  0.125 mg Oral Daily   enoxaparin (LOVENOX) injection  40 mg Subcutaneous Q24H   fluticasone furoate-vilanterol  1 puff Inhalation Daily   And   umeclidinium bromide  1 puff Inhalation Daily   furosemide  60 mg Oral BID   pantoprazole  40 mg Oral BID   predniSONE  40 mg Oral Q breakfast   sertraline  50 mg Oral QHS   Continuous Infusions:  sodium chloride Stopped (02/15/23 1506)     LOS: 1 day    Time spent: over 30 min    Fayrene Helper, MD [facility]   To contact the attending provider between 7A-7P or the covering provider during after hours 7P-7A, please log into the web site [email or web address] and access using universal [place] password for that web site. If you do not have the password, please call the hospital operator.  02/16/2023, 4:38 PM

## 2023-02-17 DIAGNOSIS — J9601 Acute respiratory failure with hypoxia: Secondary | ICD-10-CM | POA: Diagnosis not present

## 2023-02-17 LAB — COMPREHENSIVE METABOLIC PANEL
ALT: 33 U/L (ref 0–44)
AST: 26 U/L (ref 15–41)
Albumin: 2.6 g/dL — ABNORMAL LOW (ref 3.5–5.0)
Alkaline Phosphatase: 70 U/L (ref 38–126)
Anion gap: 9 (ref 5–15)
BUN: 20 mg/dL (ref 8–23)
CO2: 35 mmol/L — ABNORMAL HIGH (ref 22–32)
Calcium: 7.9 mg/dL — ABNORMAL LOW (ref 8.9–10.3)
Chloride: 89 mmol/L — ABNORMAL LOW (ref 98–111)
Creatinine, Ser: 0.85 mg/dL (ref 0.61–1.24)
GFR, Estimated: >60 mL/min (ref >60)
Glucose, Bld: 154 mg/dL — ABNORMAL HIGH (ref 70–99)
Potassium: 3.4 mmol/L — ABNORMAL LOW (ref 3.5–5.1)
Sodium: 133 mmol/L — ABNORMAL LOW (ref 135–145)
Total Bilirubin: 0.5 mg/dL (ref 0.3–1.2)
Total Protein: 5.7 g/dL — ABNORMAL LOW (ref 6.5–8.1)

## 2023-02-17 LAB — CBC WITH DIFFERENTIAL/PLATELET
Abs Immature Granulocytes: 0 10*3/uL (ref 0.00–0.07)
Basophils Absolute: 0 10*3/uL (ref 0.0–0.1)
Basophils Relative: 0 %
Eosinophils Absolute: 0 10*3/uL (ref 0.0–0.5)
Eosinophils Relative: 0 %
HCT: 32.8 % — ABNORMAL LOW (ref 39.0–52.0)
Hemoglobin: 10.9 g/dL — ABNORMAL LOW (ref 13.0–17.0)
Lymphocytes Relative: 5 %
Lymphs Abs: 0.7 10*3/uL (ref 0.7–4.0)
MCH: 26.7 pg (ref 26.0–34.0)
MCHC: 33.2 g/dL (ref 30.0–36.0)
MCV: 80.4 fL (ref 80.0–100.0)
Monocytes Absolute: 0.7 10*3/uL (ref 0.1–1.0)
Monocytes Relative: 5 %
Neutro Abs: 13.1 10*3/uL — ABNORMAL HIGH (ref 1.7–7.7)
Neutrophils Relative %: 90 %
Platelets: 249 10*3/uL (ref 150–400)
RBC: 4.08 MIL/uL — ABNORMAL LOW (ref 4.22–5.81)
RDW: 14.8 % (ref 11.5–15.5)
WBC: 14.6 10*3/uL — ABNORMAL HIGH (ref 4.0–10.5)
nRBC: 0 % (ref 0.0–0.2)
nRBC: 0 /100 WBC

## 2023-02-17 LAB — MAGNESIUM: Magnesium: 2.3 mg/dL (ref 1.7–2.4)

## 2023-02-17 LAB — BRAIN NATRIURETIC PEPTIDE: B Natriuretic Peptide: 156.8 pg/mL — ABNORMAL HIGH (ref 0.0–100.0)

## 2023-02-17 LAB — PHOSPHORUS: Phosphorus: 2.7 mg/dL (ref 2.5–4.6)

## 2023-02-17 MED ORDER — POTASSIUM CHLORIDE CRYS ER 20 MEQ PO TBCR
40.0000 meq | EXTENDED_RELEASE_TABLET | ORAL | Status: AC
  Administered 2023-02-17 (×2): 40 meq via ORAL
  Filled 2023-02-17 (×2): qty 2

## 2023-02-17 MED ORDER — IPRATROPIUM-ALBUTEROL 0.5-2.5 (3) MG/3ML IN SOLN
3.0000 mL | Freq: Four times a day (QID) | RESPIRATORY_TRACT | Status: DC
  Administered 2023-02-17 – 2023-02-19 (×9): 3 mL via RESPIRATORY_TRACT
  Filled 2023-02-17 (×9): qty 3

## 2023-02-17 MED ORDER — FUROSEMIDE 10 MG/ML IJ SOLN
60.0000 mg | Freq: Two times a day (BID) | INTRAMUSCULAR | Status: DC
  Administered 2023-02-17 – 2023-02-18 (×3): 60 mg via INTRAVENOUS
  Filled 2023-02-17 (×4): qty 6

## 2023-02-17 NOTE — Progress Notes (Signed)
PROGRESS NOTE    Paul [name]  [id number] DOB: 1946/07/26 DOA: 02/15/2023 PCP: [name], MD  Chief Complaint  Patient presents with   Chest Pain    Brief Narrative:   Paul [name] is Paul [name] 77 y.o. male with medical history significant of with h/o anemia, Paul [name] fib, COPD who was recently hospitalized x3 since the end of JANUARY 2024.   1/25: came in for SOB due to COPD exacerbation- sent on continuous 2L O2 for 2 weeks 2/3: acute COPD  exacerbation and Paul [name] fib with RVR- seems to have been placed on continuous O2 2L again [id number]: acute diastolic CHF, COPD and Paul [name] fib-- started on lasix 2/24-- back again for similar complaints-- for last 24 hours has been getting more SOB.  Denies fever, denies chills, denies sick contacts-- does say he has been wheezing.  He was placed on bipap and given IV steroids with improvement.  He does endorse LE edema but there was none on exam.  His weight is up 3lbs per patient.  Denies salt intake    Assessment & Plan:   Active Problems:   * No active hospital problems. *  Acute respiratory failure  -wears 2L O2 sats only at night -suspect due to HF and COPD exacerbations  Acute on Chronic Diastolic Heart Failure Trace edema to LE's and lower back CXR without acute findings BNP elevated above normal Will transition to IV lasix this afternoon, reevaluate tmrw AM Strict I/O, daily weights  COPD Exacerbation Diminished air movement, expiratory wheezing Steroids, scheduled and prn nebs Continue PO steroids for now Negative procal Negative flu, covid, RSV Seen by Dr. Melvyn [name] 2/19  Hyponatremia Suspect hypervolemic Follow with diuresis  Paroxysmal atrial fibrillation  - s/Paul Watchman in 2022 was noted to be in correct placement with no leaking on TEE in September 2022.  -intolerant of beta-blocker secondary to syncopal episodes and falls. -CHA2DS2-VASc Score = 6      Coronary artery calcification - Stable with no anginal symptoms. No indication for  ischemic evaluation.  Continue aspirin, carvedilol.   Hypertension  -recently started  on amlodipine 2.5 mg daily.      DVT prophylaxis: lovenox Code Status: full Family Communication: none Disposition:   Status is: Inpatient Remains inpatient appropriate because: continued SOB needing inpatient treatment   Consultants:  none  Procedures:  non  Antimicrobials:  Anti-infectives (From admission, onward)    None       Subjective: Feels 80% Still SOB   Objective: Vitals:   02/16/23 1700 02/16/23 2059 02/17/23 0530 02/17/23 0825  BP: (!) 140/56 133/66 (!) 147/59 (!) 143/55  Pulse: 76 71 76 74  Resp: '19 18 18 20  '$ Temp: 98.8 F (37.1 C) 98 F (36.7 C) 98.5 F (36.9 C) 98.3 F (36.8 C)  TempSrc: Oral Oral Oral Oral  SpO2: 95% 98% 98% 99%  Weight:   70.2 kg   Height:        Intake/Output Summary (Last 24 hours) at 02/17/2023 0951 Last data filed at 02/17/2023 0534 Gross per 24 hour  Intake --  Output 1575 ml  Net -1575 ml   Filed Weights   02/15/23 1329 02/16/23 0753 02/17/23 0530  Weight: 68.5 kg 70.4 kg 70.2 kg    Examination:  General: No acute distress. Cardiovascular: RRR Lungs: faint expiratory wheezes Abdomen: Soft, nontender, nondistended Neurological: Alert and oriented 3. Moves all extremities 4 with equal strength. Cranial nerves II through XII grossly intact. Extremities: minimal LE edema, trace dependent  edema to lower back   Data Reviewed: I have personally reviewed following labs and imaging studies  CBC: Recent Labs  Lab 02/15/23 1404 02/16/23 0204 02/17/23 0201  WBC 17.8* 12.4* 14.6*  NEUTROABS 16.0*  --  13.1*  HGB 11.5* 10.9* 10.9*  HCT 35.9* 33.0* 32.8*  MCV 81.0 79.5* 80.4  PLT 265 223 0000000    Basic Metabolic Panel: Recent Labs  Lab 02/12/23 1416 02/15/23 1404 02/16/23 0204 02/17/23 0201  NA 139 132* 134* 133*  K 4.5 3.5 3.6 3.4*  CL 88* 88* 89* 89*  CO2 29 35* 35* 35*  GLUCOSE 136* 188* 190* 154*  BUN '23 19  16 20  '$ CREATININE 1.05 0.93 0.96 0.85  CALCIUM 9.0 8.0* 7.9* 7.9*  MG  --   --   --  2.3  PHOS  --   --   --  2.7    GFR: Estimated Creatinine Clearance: 65.7 mL/min (by C-G formula based on SCr of 0.85 mg/dL).  Liver Function Tests: Recent Labs  Lab 02/12/23 1416 02/15/23 1404 02/17/23 0201  AST '26 30 26  '$ ALT 42 38 33  ALKPHOS 95 70 70  BILITOT 0.3 0.7 0.5  PROT 5.9* 6.6 5.7*  ALBUMIN 3.9 3.1* 2.6*    CBG: No results for input(s): "GLUCAP" in the last 168 hours.   Recent Results (from the past 240 hour(s))  Resp panel by RT-PCR (RSV, Flu Paul [name]&B, Covid) Anterior Nasal Swab     Status: None   Collection Time: 02/15/23  2:04 PM   Specimen: Anterior Nasal Swab  Result Value Ref Range Status   SARS Coronavirus 2 by RT PCR NEGATIVE NEGATIVE Final    Comment: (NOTE) SARS-CoV-2 target nucleic acids are NOT DETECTED.  The SARS-CoV-2 RNA is generally detectable in upper respiratory specimens during the acute phase of infection. The lowest concentration of SARS-CoV-2 viral copies this assay can detect is 138 copies/mL. Paul [name] negative result does not preclude SARS-Cov-2 infection and should not be used as the sole basis for treatment or other patient management decisions. Paul [name] negative result may occur with  improper specimen collection/handling, submission of specimen other than nasopharyngeal swab, presence of viral mutation(s) within the areas targeted by this assay, and inadequate number of viral copies(<138 copies/mL). Paul [name] negative result must be combined with clinical observations, patient history, and epidemiological information. The expected result is Negative.  Fact Sheet for Patients:  EntrepreneurPulse.com.au  Fact Sheet for Healthcare Providers:  IncredibleEmployment.be  This test is no t yet approved or cleared by the [place] FDA and  has been authorized for detection and/or diagnosis of SARS-CoV-2 by FDA under an Emergency Use  Authorization (EUA). This EUA will remain  in effect (meaning this test can be used) for the duration of the COVID-19 declaration under Section 564(b)(1) of the Act, 21 U.S.C.section 360bbb-3(b)(1), unless the authorization is terminated  or revoked sooner.       Influenza Nestor Wieneke by PCR NEGATIVE NEGATIVE Final   Influenza B by PCR NEGATIVE NEGATIVE Final    Comment: (NOTE) The Xpert Xpress SARS-CoV-2/FLU/RSV plus assay is intended as an aid in the diagnosis of influenza from Nasopharyngeal swab specimens and should not be used as Bryker Fletchall sole basis for treatment. Nasal washings and aspirates are unacceptable for Xpert Xpress SARS-CoV-2/FLU/RSV testing.  Fact Sheet for Patients: EntrepreneurPulse.com.au  Fact Sheet for Healthcare Providers: IncredibleEmployment.be  This test is not yet approved or cleared by the [place] FDA and has been authorized for detection and/or diagnosis of  SARS-CoV-2 by FDA under an Emergency Use Authorization (EUA). This EUA will remain in effect (meaning this test can be used) for the duration of the COVID-19 declaration under Section 564(b)(1) of the Act, 21 U.S.C. section 360bbb-3(b)(1), unless the authorization is terminated or revoked.     Resp Syncytial Virus by PCR NEGATIVE NEGATIVE Final    Comment: (NOTE) Fact Sheet for Patients: EntrepreneurPulse.com.au  Fact Sheet for Healthcare Providers: IncredibleEmployment.be  This test is not yet approved or cleared by the [place] FDA and has been authorized for detection and/or diagnosis of SARS-CoV-2 by FDA under an Emergency Use Authorization (EUA). This EUA will remain in effect (meaning this test can be used) for the duration of the COVID-19 declaration under Section 564(b)(1) of the Act, 21 U.S.C. section 360bbb-3(b)(1), unless the authorization is terminated or revoked.  Performed at [facility], [address], [place], [place] [address]          Radiology Studies: DG Chest Portable 1 View  Result Date: 02/15/2023 CLINICAL DATA:  Chest pain and shortness of breath for 2 days. EXAM: PORTABLE CHEST 1 VIEW COMPARISON:  None Available. FINDINGS: The heart size and mediastinal contours are within normal limits. Aortic atherosclerotic calcification incidentally noted. Both lungs are clear. Pulmonary hyperinflation again seen, consistent with COPD. IMPRESSION: COPD. No active disease. Electronically Signed   By: Marlaine Hind M.D.   On: 02/15/2023 14:27        Scheduled Meds:  amLODipine  2.5 mg Oral Daily   vitamin C  1,000 mg Oral Daily   aspirin EC  81 mg Oral Daily   atorvastatin  40 mg Oral QHS   calcium carbonate  625 mg Oral Q breakfast   carvedilol  3.125 mg Oral BID WC   cholecalciferol  2,000 Units Oral Daily   digoxin  0.125 mg Oral Daily   enoxaparin (LOVENOX) injection  40 mg Subcutaneous Q24H   fluticasone furoate-vilanterol  1 puff Inhalation Daily   And   umeclidinium bromide  1 puff Inhalation Daily   furosemide  60 mg Oral BID   pantoprazole  40 mg Oral BID   potassium chloride  40 mEq Oral Q4H   predniSONE  40 mg Oral Q breakfast   sertraline  50 mg Oral QHS   Continuous Infusions:  sodium chloride Stopped (02/15/23 1506)     LOS: 2 days    Time spent: over 30 min    Fayrene Helper, MD [facility]   To contact the attending provider between 7A-7P or the covering provider during after hours 7P-7A, please log into the web site [email or web address] and access using universal [place] password for that web site. If you do not have the password, please call the hospital operator.  02/17/2023, 9:51 AM

## 2023-02-17 NOTE — Progress Notes (Signed)
Pt. States he will notify if he decides to wear the bipap. 

## 2023-02-17 NOTE — Consult Note (Signed)
   [facility] Inpatient Consult   02/17/2023  Paul [name] March 16, 1946 [id number]  [place] [facility] [ACO] Patient: Medicare ACO REACH  Primary Care Provider:  [name], MD   Patient screened for hospitalization with noted extreme high risk score for unplanned readmission risk and to assess for ongoing [place] Management service follow up for care coordination.  Review of patient's electronic medical record reveals patient is for respiratory failure and has been contacted and to have follow up with [facility] RN CC.   Plan:  Continue to follow progress and disposition to assess for post hospital community care coordination/management needs.  Referral request for community care coordination: patient has reached out and will follow up  Of note, [place] does not replace or interfere with any arrangements made by the Inpatient Transition of Care team.  For questions contact:   Natividad Brood, RN BSN [place]  [phone] business mobile phone Toll free office [phone]  *[place]  [phone] Fax number: [phone] [email or web address]

## 2023-02-18 ENCOUNTER — Ambulatory Visit: Payer: Medicare Other | Admitting: Cardiology

## 2023-02-18 DIAGNOSIS — J9621 Acute and chronic respiratory failure with hypoxia: Secondary | ICD-10-CM | POA: Diagnosis not present

## 2023-02-18 LAB — CBC
HCT: 32.5 % — ABNORMAL LOW (ref 39.0–52.0)
Hemoglobin: 10.6 g/dL — ABNORMAL LOW (ref 13.0–17.0)
MCH: 26.5 pg (ref 26.0–34.0)
MCHC: 32.6 g/dL (ref 30.0–36.0)
MCV: 81.3 fL (ref 80.0–100.0)
Platelets: 235 10*3/uL (ref 150–400)
RBC: 4 MIL/uL — ABNORMAL LOW (ref 4.22–5.81)
RDW: 14.9 % (ref 11.5–15.5)
WBC: 12.3 10*3/uL — ABNORMAL HIGH (ref 4.0–10.5)
nRBC: 0 % (ref 0.0–0.2)

## 2023-02-18 LAB — BASIC METABOLIC PANEL
Anion gap: 11 (ref 5–15)
BUN: 22 mg/dL (ref 8–23)
CO2: 33 mmol/L — ABNORMAL HIGH (ref 22–32)
Calcium: 8.2 mg/dL — ABNORMAL LOW (ref 8.9–10.3)
Chloride: 92 mmol/L — ABNORMAL LOW (ref 98–111)
Creatinine, Ser: 0.89 mg/dL (ref 0.61–1.24)
GFR, Estimated: >60 mL/min (ref >60)
Glucose, Bld: 127 mg/dL — ABNORMAL HIGH (ref 70–99)
Potassium: 4.4 mmol/L (ref 3.5–5.1)
Sodium: 136 mmol/L (ref 135–145)

## 2023-02-18 LAB — BRAIN NATRIURETIC PEPTIDE: B Natriuretic Peptide: 113.2 pg/mL — ABNORMAL HIGH (ref 0.0–100.0)

## 2023-02-18 LAB — PHOSPHORUS: Phosphorus: 2.5 mg/dL (ref 2.5–4.6)

## 2023-02-18 LAB — MAGNESIUM: Magnesium: 2.3 mg/dL (ref 1.7–2.4)

## 2023-02-18 MED ORDER — PREDNISONE 20 MG PO TABS
30.0000 mg | ORAL_TABLET | Freq: Every day | ORAL | Status: AC
  Administered 2023-02-19 – 2023-02-21 (×3): 30 mg via ORAL
  Filled 2023-02-18 (×3): qty 1

## 2023-02-18 MED ORDER — PREDNISONE 10 MG PO TABS: 10.0000 mg | ORAL_TABLET | Freq: Every day | ORAL | Status: DC

## 2023-02-18 MED ORDER — PREDNISONE 20 MG PO TABS: 20.0000 mg | ORAL_TABLET | Freq: Every day | ORAL | Status: DC

## 2023-02-18 NOTE — Progress Notes (Incomplete)
SATURATION QUALIFICATIONS: (This note is used to comply with regulatory documentation for home oxygen)  Patient Saturations on Room Air at Rest = ***%  Patient Saturations on Room Air while Ambulating = ***%  Patient Saturations on *** Liters of oxygen while Ambulating = ***%  Please briefly explain why patient needs home oxygen: 

## 2023-02-18 NOTE — Progress Notes (Signed)
Nutrition Quick Note:   MD consult for diet education. Pt reports that he is familiar with heart healthy diet and declines diet education at this time. PO 100% and no other nutritional concerns. RD still provided handout.   No nutritional interventions warranted at this time. Please re-consult if dietitian services needed.   Thalia Bloodgood, RD, LDN, CNSC.

## 2023-02-18 NOTE — Progress Notes (Signed)
   02/18/23 1530  What Happened  Was fall witnessed? No  Was patient injured? No  Patient found on floor  Found by Staff-comment Jovita Kussmaul)  Stated prior activity ambulating-unassisted  Provider Notification  Provider Name/Title Dr. Florene Glen  Date Provider Notified 02/18/23  Time Provider Notified 49  Method of Notification Page  Notification Reason Fall  Provider response No new orders  Date of Provider Response 02/18/23  Time of Provider Response 47  Follow Up  Family notified Yes - comment  Time family notified 1540  Additional tests No  Progress note created (see row info) Yes  Adult Fall Risk Assessment  Risk Factor Category (scoring not indicated) High fall risk per protocol (document High fall risk)  Patient Fall Risk Level High fall risk  Adult Fall Risk Interventions  Required Bundle Interventions *See Row Information* High fall risk - low, moderate, and high requirements implemented  Additional Interventions Use of appropriate toileting equipment (bedpan, BSC, etc.)  Fall intervention(s) refused/Patient educated regarding refusal Bed alarm;Nonskid socks;Open door if unsupervised;Yellow bracelet  Screening for Fall Injury Risk (To be completed on HIGH fall risk patients) - Assessing Need for Floor Mats  Risk For Fall Injury- Criteria for Floor Mats Previous fall this admission  Will Implement Floor Mats Yes  Vitals  Temp 97.6 F (36.4 C)  Temp Source Oral  BP (!) 175/70  MAP (mmHg) 100  BP Location Right Arm  BP Method Automatic  Patient Position (if appropriate) Lying  Pulse Rate 91  Pulse Rate Source Monitor  ECG Heart Rate 90  Resp 16  Oxygen Therapy  SpO2 91 %  O2 Device Room Air  Pain Assessment  Pain Scale 0-10  Pain Score 0  PCA/Epidural/Spinal Assessment  Respiratory Pattern Regular;Unlabored  Neurological  Neuro (WDL) WDL  Level of Consciousness Alert  Glasgow Coma Scale  Eye Opening 4  Best Verbal Response (NON-intubated) 5  Best Motor  Response 6  Glasgow Coma Scale Score 15  Musculoskeletal  Musculoskeletal (WDL) X  Assistive Device Front wheel walker  Generalized Weakness Yes  Weight Bearing Restrictions No  Integumentary  Integumentary (WDL) X  Skin Color Appropriate for ethnicity  Skin Condition Dry  Skin Integrity Abrasion;Ecchymosis  Abrasion Location Knee  Abrasion Location Orientation Bilateral  Ecchymosis Location Abdomen;Arm  Ecchymosis Location Orientation Bilateral

## 2023-02-18 NOTE — TOC Initial Note (Signed)
Transition of Care [facility]) - Initial/Assessment Note    Patient Details  Name: Paul [name] MRN: [id number] Date of Birth: 12-19-1946  Transition of Care [facility]) CM/SW Contact:    Bethena Roys, RN Phone Number: 02/18/2023, 3:55 PM  Clinical Narrative:  Risk for readmission assessment completed. Patient presented for shortness of breath. PTA patient was from home with support of spouse. Case Manager spoke with patient and he reports that has oxygen 2 liters via [employer]- patient states he wears oxygen continuously. Concentrator in the home is 5 Liters. Patient was previously active with [facility] for outpatient palliative services; however, he stopped services mid Feb. Case Manager discussed the frequent readmissions and the possibility of [facility] RN visiting vs adding palliative services back to his disposition plan. Patient would rather have outpatient palliative services. Case Manager called [facility],  they can accept the patient; however, will not be able to see the patient until the end of March. No further home needs identified at this time.    Expected Discharge Plan: Home/Self Care Barriers to Discharge: Continued Medical Work up   Patient Goals and CMS Choice Patient states their goals for this hospitalization and ongoing recovery are:: to return home   Choice offered to / list presented to : NA      Expected Discharge Plan and Services In-house Referral: NA Discharge Planning Services: CM Consult Post Acute Care Choice: NA Living arrangements for the past 2 months: Single Family Home                   DME Agency: NA       HH Arranged: Patient Refused [facility] (Open to outpatient palliative.)    Prior Living Arrangements/Services Living arrangements for the past 2 months: Single Family Home Lives with:: Spouse Patient language and need for interpreter reviewed:: Yes        Need for Family Participation in Patient Care: Yes (Comment) Care  giver support system in place?: Yes (comment) Current home services: DME (Oxygen via [employer] 2 liters.) Criminal Activity/Legal Involvement Pertinent to Current Situation/Hospitalization: No - Comment as needed  Permission Sought/Granted Permission sought to share information with : Case Manager, Family Supports     Emotional Assessment Appearance:: Appears stated age Attitude/Demeanor/Rapport: Engaged Affect (typically observed): Appropriate Orientation: : Oriented to Self, Oriented to Place, Oriented to  Time, Oriented to Situation Alcohol / Substance Use: Not Applicable Psych Involvement: No (comment)  Admission diagnosis:  COPD (chronic obstructive pulmonary disease) ([place]) [J44.9] Acute respiratory failure with hypoxia ([place]) [J96.01] COPD with acute exacerbation ([place]) [J44.1] Patient Active Problem List   Diagnosis Date Noted   Hypoxia 02/04/2023   Elevated d-dimer 01/25/2023   COPD with acute exacerbation ([place]) 01/16/2023   Iron 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 08/16/2022   Olecranon bursitis of right elbow 05/16/2022   Syncope 04/08/2022   GERD (gastroesophageal reflux disease) / GI protection on antiplatelet therapy 12/27/2021   Lumbar back pain 11/17/2021   Oxygen dependent 10/09/2021   Paroxysmal atrial fibrillation ([place]) 08/02/2021   Chronic respiratory failure with hypoxia ([place]) 01/16/2021   GAD (generalized anxiety disorder) 01/14/2021   Chronic diastolic CHF (congestive heart failure) ([place]) 04/01/2020   Acute respiratory failure with hypoxia ([place]) 02/17/2020   Age-related osteoporosis with healing pathological fracture, chronic back pain 02/06/2020   Hypertension associated with diabetes ([place]) 01/28/2020   Chronic bilateral thoracic back pain 01/25/2020   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[place]) 01/25/2020  HLD (hyperlipidemia) 01/05/2020   Chronic anemia 05/29/2019   Pulmonary nodules 05/29/2019   Coronary artery  calcification 04/06/2019   Atherosclerosis of aorta ([place]) 04/06/2019   Acute on chronic respiratory failure with hypoxia [facility])    Former cigarette smoker 12/05/2015   COPD GOLD  III     PCP:  [name], MD Pharmacy:   [id number] [place], [place] - [address]: [phone]: [address]Phone: [phone]: [phone] Social Determinants of Health (SDOH) Social History: SDOH Screenings   Food Insecurity: No Food Insecurity (02/10/2023)  Housing: Low Risk  (02/04/2023)  Transportation Needs: No Transportation Needs (02/10/2023)  Utilities: Not At Risk (02/04/2023)  Alcohol Screen: Low Risk  (02/15/2022)  Depression (PHQ2-9): Low Risk  (11/22/2022)  Financial Resource Strain: Medium Risk (10/31/2022)  Physical Activity: Insufficiently Active (02/15/2022)  Social Connections: Socially Integrated (02/15/2022)  Stress: No Stress Concern Present (02/15/2022)  Tobacco Use: Medium Risk (02/15/2023)   Readmission Risk Interventions    02/18/2023    3:55 PM 01/27/2023    3:27 PM 07/17/2022    3:42 PM  Readmission Risk Prevention Plan  Transportation Screening Complete Complete Complete  HRI or HHeathcote Complete Complete  Social Work Consult for RIpavaPlanning/Counseling  Complete Complete  Palliative Care Screening  Not Applicable Not Applicable  Medication Review (Press photographer Referral to Pharmacy Referral to Pharmacy   [place]    SW Recovery Care/Counseling Consult Complete    Palliative Care Screening Complete    SDeer ParkNot Applicable

## 2023-02-18 NOTE — Progress Notes (Signed)
Nurse requested Mobility Specialist to perform oxygen saturation test with pt which includes removing pt from oxygen both at rest and while ambulating.  Below are the results from that testing.     Patient Saturations on Room Air at Rest = spO2 94%  Patient Saturations on Room Air while Ambulating = sp02 91% .  Reported results to nurse.   Nelta Numbers Mobility Specialist Please contact via SecureChat or  Rehab office at [phone]

## 2023-02-18 NOTE — Progress Notes (Signed)
Mobility Specialist Progress Note:   02/18/23 1055  Mobility  Activity Ambulated independently in hallway;Ambulated with assistance in hallway  Level of Assistance Standby assist, set-up cues, supervision of patient - no hands on  Assistive Device None  Distance Ambulated (ft) 400 ft  Activity Response Tolerated well  Mobility Referral Yes  $Mobility charge 1 Mobility   Pt agreeable to mobility session. Required no physical assistance, only supervision for safety. Pt tolerated ambulation on RA, SpO2 >90% throughout. Pt back in chair with hall needs met.   Nelta Numbers Mobility Specialist Please contact via SecureChat or  Rehab office at [phone]

## 2023-02-18 NOTE — Evaluation (Signed)
Physical Therapy Evaluation Patient Details Name: Paul [name] MRN: [id number] DOB: 1946-12-20 Today's Date: 02/18/2023  History of Present Illness  77 y.o. male presents to [facility] hospital on 02/15/2023 with SOB, admitted for management of CHF exacerbation and COPD. Pt has been hospitalized 4 times in 4 weeks. PMH includes COPD on O2 2L chronically, A-fib not on anticoagulant sp watchman, DM2 CHF diastolic HLD anemia.  Clinical Impression  Pt presents to PT with no deficits in mobility at this time, ambulating independently with stable sats. Pt reports becoming lightheaded prior to fall this afternoon, with no recollection of falling until landing on his buttocks. Pt does report a history of intermittent lightheadedness when mobilizing at home, but denies previous syncopal episodes. Pt denies symptoms of orthostatic BP this session. PT provides education on measures to reduce the risk of orthostatic BP including increased time between positional changes and sitting as quickly as able if symptomatic, pt reports he has been doing this already. Pt does not appear to require further PT intervention at this time, if orthostatic symptoms remain an issue the use of compression stockings may be indicated. PT encourages the pt to continue mobilizing frequently during this admission. Mobility specialist team will continue to follow. Acute PT signing off.     Recommendations for follow up therapy are one component of a multi-disciplinary discharge planning process, led by the attending physician.  Recommendations may be updated based on patient status, additional functional criteria and insurance authorization.  Follow Up Recommendations No PT follow up      Assistance Recommended at Discharge PRN  Patient can return home with the following  Assistance with cooking/housework    Equipment Recommendations None recommended by PT  Recommendations for Other Services       Functional Status Assessment Patient  has not had a recent decline in their functional status     Precautions / Restrictions Precautions Precautions: Fall Precaution Comments: monitor HR Restrictions Weight Bearing Restrictions: No      Mobility  Bed Mobility Overal bed mobility: Independent                  Transfers Overall transfer level: Independent                      Ambulation/Gait Ambulation/Gait assistance: Independent Gait Distance (Feet): 300 Feet Assistive device: None Gait Pattern/deviations: WFL(Within Functional Limits) Gait velocity: functional Gait velocity interpretation: >2.62 ft/sec, indicative of community ambulatory   General Gait Details: steady step-through gait  Stairs            Wheelchair Mobility    Modified Rankin (Stroke Patients Only)       Balance Overall balance assessment: No apparent balance deficits (not formally assessed)                                           Pertinent Vitals/Pain Pain Assessment Pain Assessment: No/denies pain    Home Living Family/patient expects to be discharged to:: Private residence Living Arrangements: Spouse/significant other Available Help at Discharge: Family;Available 24 hours/day Type of Home: House Home Access: Level entry       Home Layout: One level Home Equipment: Rollator (4 wheels);Shower seat;Grab bars - tub/shower Additional Comments: only uses supplemental O2 (up to 2L) when sleeping and with heavy activity    Prior Function Prior Level of Function : Independent/Modified Independent  Hand Dominance   Dominant Hand: Right    Extremity/Trunk Assessment   Upper Extremity Assessment Upper Extremity Assessment: Overall WFL for tasks assessed    Lower Extremity Assessment Lower Extremity Assessment: Overall WFL for tasks assessed    Cervical / Trunk Assessment Cervical / Trunk Assessment: Normal  Communication   Communication: No  difficulties  Cognition Arousal/Alertness: Awake/alert Behavior During Therapy: WFL for tasks assessed/performed Overall Cognitive Status: Within Functional Limits for tasks assessed                                          General Comments General comments (skin integrity, edema, etc.): SpO2 in mid 90s when ambulating, pt denies orthostatic symptoms during session, RN recently checked orthostatic vitals which were negative. HR tachy into 120s although rate possibly altered by artifact as rate decreases rapidly to loww 100s with cessation of mobility.    Exercises     Assessment/Plan    PT Assessment Patient does not need any further PT services  PT Problem List         PT Treatment Interventions      PT Goals (Current goals can be found in the Care Plan section)       Frequency       Co-evaluation               AM-PAC PT "6 Clicks" Mobility  Outcome Measure Help needed turning from your back to your side while in a flat bed without using bedrails?: None Help needed moving from lying on your back to sitting on the side of a flat bed without using bedrails?: None Help needed moving to and from a bed to a chair (including a wheelchair)?: None Help needed standing up from a chair using your arms (e.g., wheelchair or bedside chair)?: None Help needed to walk in hospital room?: None Help needed climbing 3-5 steps with a railing? : None 6 Click Score: 24    End of Session   Activity Tolerance: Patient tolerated treatment well Patient left: in bed;with bed alarm set;with call bell/phone within reach Nurse Communication: Mobility status PT Visit Diagnosis: Other abnormalities of gait and mobility (R26.89)    Time: 1725-1735 PT Time Calculation (min) (ACUTE ONLY): 10 min   Charges:   PT Evaluation $PT Eval Low Complexity: 1 Low          Zenaida Niece, PT, DPT Acute Rehabilitation Office [phone]   Zenaida Niece 02/18/2023, 5:45 PM

## 2023-02-18 NOTE — Progress Notes (Addendum)
PROGRESS NOTE    Paul [name]  [id number] DOB: Feb 18, 1946 DOA: 02/15/2023 PCP: [name], MD  Chief Complaint  Patient presents with   Chest Pain    Brief Narrative:   Paul [name] is Paul [name] 77 y.o. male with medical history significant of with h/o anemia, Paul [name] fib, COPD who was recently hospitalized x3 since the end of JANUARY 2024.   1/25: came in for SOB due to COPD exacerbation- sent on continuous 2L O2 for 2 weeks 2/3: acute COPD  exacerbation and Paul [name] fib with RVR- seems to have been placed on continuous O2 2L again [id number]: acute diastolic CHF, COPD and p Paul [name] fib-- started on lasix 2/24-- back again for similar complaints-- for last 24 hours has been getting more SOB.  Denies fever, denies chills, denies sick contacts-- does say he has been wheezing.  He was placed on bipap and given IV steroids with improvement.  He does endorse LE edema but there was none on exam.  His weight is up 3lbs per patient.  Denies salt intake   Treated for COPD and HF exacerbations.  I think he needs more aggressive diuretic regimen at discharge.  Hopefully can d/c in next day or so.  Attention to diuretic dose at discharge.    Assessment & Plan:   Active Problems:   Acute on chronic respiratory failure with hypoxia (HCC)   Acute respiratory failure with hypoxia (HCC)  Addendum: pt found on bottom, fall, no apparent injuries per discussion with RN.  No head trauma.  Will order PT/OT imminent discharge to assess needs.  Addendum - when nurse walked him, c/o LH.  Negative orthostatics.  No tele events. Hold lasix for now.  Follow therapy.  Reassess in AM.  Acute respiratory failure  -wears 2L O2 sats only at night -suspect due to HF and COPD exacerbations  Acute on Chronic Diastolic Heart Failure Trace edema to LE's and lower back CXR without acute findings BNP elevated above normal Continue IV lasix, would discharge on higher dose than his home 40 mg PO twice daily  Strict I/O (neg negative 5.6),  daily weights (down to 153 lbs)  COPD Exacerbation COPD exacerbation has improved Steroids, scheduled and prn nebs Continue PO steroids for now - taper Negative procal Negative flu, covid, RSV Seen by Dr. Melvyn [name] 2/19  Hyponatremia Suspect hypervolemic Follow with diuresis  Paroxysmal atrial fibrillation  - s/p Watchman in 2022 was noted to be in correct placement with no leaking on TEE in September 2022.  -intolerant of beta-blocker secondary to syncopal episodes and falls. -CHA2DS2-VASc Score = 6      Coronary artery calcification - Stable with no anginal symptoms. No indication for ischemic evaluation.  Continue aspirin, carvedilol.   Hypertension  -recently started  on amlodipine 2.5 mg daily.      DVT prophylaxis: lovenox Code Status: full Family Communication: none Disposition:   Status is: Inpatient Remains inpatient appropriate because: continued SOB needing inpatient treatment   Consultants:  none  Procedures:  non  Antimicrobials:  Anti-infectives (From admission, onward)    None       Subjective: Still feels about 80%  Objective: Vitals:   02/18/23 0804 02/18/23 0829 02/18/23 1351 02/18/23 1454  BP: 128/68 (!) 129/58  128/60  Pulse: 72     Resp: 18     Temp: 97.8 F (36.6 C)     TempSrc: Oral     SpO2: 94%  95%   Weight:  Height:        Intake/Output Summary (Last 24 hours) at 02/18/2023 1525 Last data filed at 02/18/2023 1100 Gross per 24 hour  Intake 240 ml  Output 2900 ml  Net -2660 ml   Filed Weights   02/16/23 0753 02/17/23 0530 02/18/23 0412  Weight: 70.4 kg 70.2 kg 69.4 kg    Examination:  General: No acute distress. Cardiovascular: RRR Lungs: no wheezing, CTAB  Abdomen: Soft, nontender, nondistended  Neurological: Alert and oriented 3. Moves all extremities 4 with equal strength. Cranial nerves II through XII grossly intact. Skin: Warm and dry. No rashes or lesions. Extremities:  no LE edema, trace to lower back     Data Reviewed: I have personally reviewed following labs and imaging studies  CBC: Recent Labs  Lab 02/15/23 1404 02/16/23 0204 02/17/23 0201 02/18/23 0217  WBC 17.8* 12.4* 14.6* 12.3*  NEUTROABS 16.0*  --  13.1*  --   HGB 11.5* 10.9* 10.9* 10.6*  HCT 35.9* 33.0* 32.8* 32.5*  MCV 81.0 79.5* 80.4 81.3  PLT 265 223 249 [id number]    Basic Metabolic Panel: Recent Labs  Lab 02/12/23 1416 02/15/23 1404 02/16/23 0204 02/17/23 0201 02/18/23 0217  NA 139 132* 134* 133* 136  K 4.5 3.5 3.6 3.4* 4.4  CL 88* 88* 89* 89* 92*  CO2 29 35* 35* 35* 33*  GLUCOSE 136* 188* 190* 154* 127*  BUN '23 19 16 20 22  '$ CREATININE 1.05 0.93 0.96 0.85 0.89  CALCIUM 9.0 8.0* 7.9* 7.9* 8.2*  MG  --   --   --  2.3 2.3  PHOS  --   --   --  2.7 2.5    GFR: Estimated Creatinine Clearance: 62.7 mL/min (by C-G formula based on SCr of 0.89 mg/dL).  Liver Function Tests: Recent Labs  Lab 02/12/23 1416 02/15/23 1404 02/17/23 0201  AST '26 30 26  '$ ALT 42 38 33  ALKPHOS 95 70 70  BILITOT 0.3 0.7 0.5  PROT 5.9* 6.6 5.7*  ALBUMIN 3.9 3.1* 2.6*    CBG: No results for input(s): "GLUCAP" in the last 168 hours.   Recent Results (from the past 240 hour(s))  Resp panel by RT-PCR (RSV, Flu Paul [name], Covid) Anterior Nasal Swab     Status: None   Collection Time: 02/15/23  2:04 PM   Specimen: Anterior Nasal Swab  Result Value Ref Range Status   SARS Coronavirus 2 by RT PCR NEGATIVE NEGATIVE Final    Comment: (NOTE) SARS-CoV-2 target nucleic acids are NOT DETECTED.  The SARS-CoV-2 RNA is generally detectable in upper respiratory specimens during the acute phase of infection. The lowest concentration of SARS-CoV-2 viral copies this assay can detect is 138 copies/mL. Paul [name] negative result does not preclude SARS-Cov-2 infection and should not be used as the sole basis for treatment or other patient management decisions. Paul [name] negative result may occur with  improper specimen collection/handling, submission of specimen  other than nasopharyngeal swab, presence of viral mutation(s) within the areas targeted by this assay, and inadequate number of viral copies(<138 copies/mL). Paul [name] negative result must be combined with clinical observations, patient history, and epidemiological information. The expected result is Negative.  Fact Sheet for Patients:  EntrepreneurPulse.com.au  Fact Sheet for Healthcare Providers:  IncredibleEmployment.be  This test is no t yet approved or cleared by the [place] FDA and  has been authorized for detection and/or diagnosis of SARS-CoV-2 by FDA under an Emergency Use Authorization (EUA). This EUA will remain  in effect (meaning this  test can be used) for the duration of the COVID-19 declaration under Section 564(b)(1) of the Act, 21 U.S.C.section 360bbb-3(b)(1), unless the authorization is terminated  or revoked sooner.       Influenza Donie Lemelin by PCR NEGATIVE NEGATIVE Final   Influenza B by PCR NEGATIVE NEGATIVE Final    Comment: (NOTE) The Xpert Xpress SARS-CoV-2/FLU/RSV plus assay is intended as an aid in the diagnosis of influenza from Nasopharyngeal swab specimens and should not be used as Roshni Burbano sole basis for treatment. Nasal washings and aspirates are unacceptable for Xpert Xpress SARS-CoV-2/FLU/RSV testing.  Fact Sheet for Patients: EntrepreneurPulse.com.au  Fact Sheet for Healthcare Providers: IncredibleEmployment.be  This test is not yet approved or cleared by the [place] FDA and has been authorized for detection and/or diagnosis of SARS-CoV-2 by FDA under an Emergency Use Authorization (EUA). This EUA will remain in effect (meaning this test can be used) for the duration of the COVID-19 declaration under Section 564(b)(1) of the Act, 21 U.S.C. section 360bbb-3(b)(1), unless the authorization is terminated or revoked.     Resp Syncytial Virus by PCR NEGATIVE NEGATIVE Final     Comment: (NOTE) Fact Sheet for Patients: EntrepreneurPulse.com.au  Fact Sheet for Healthcare Providers: IncredibleEmployment.be  This test is not yet approved or cleared by the [place] FDA and has been authorized for detection and/or diagnosis of SARS-CoV-2 by FDA under an Emergency Use Authorization (EUA). This EUA will remain in effect (meaning this test can be used) for the duration of the COVID-19 declaration under Section 564(b)(1) of the Act, 21 U.S.C. section 360bbb-3(b)(1), unless the authorization is terminated or revoked.  Performed at [facility], [address] [place], [place] [address]          Radiology Studies: No results found.      Scheduled Meds:  amLODipine  2.5 mg Oral Daily   vitamin C  1,000 mg Oral Daily   aspirin EC  81 mg Oral Daily   atorvastatin  40 mg Oral QHS   calcium carbonate  625 mg Oral Q breakfast   carvedilol  3.125 mg Oral BID WC   cholecalciferol  2,000 Units Oral Daily   digoxin  0.125 mg Oral Daily   enoxaparin (LOVENOX) injection  40 mg Subcutaneous Q24H   furosemide  60 mg Intravenous BID   ipratropium-albuterol  3 mL Nebulization Q6H WA   pantoprazole  40 mg Oral BID   predniSONE  40 mg Oral Q breakfast   sertraline  50 mg Oral QHS   Continuous Infusions:  sodium chloride Stopped (02/15/23 1506)     LOS: 3 days    Time spent: over 30 min    Fayrene Helper, MD [facility]   To contact the attending provider between 7A-7P or the covering provider during after hours 7P-7A, please log into the web site [email or web address] and access using universal [place] password for that web site. If you do not have the password, please call the hospital operator.  02/18/2023, 3:25 PM

## 2023-02-18 NOTE — Plan of Care (Signed)

## 2023-02-18 NOTE — Care Management Important Message (Signed)
Important Message  Patient Details  Name: Paul [name] MRN: [id number] Date of Birth: Jun 10, 1946   Medicare Important Message Given:  Yes     Shelda Altes 02/18/2023, 9:59 AM

## 2023-02-19 DIAGNOSIS — I5031 Acute diastolic (congestive) heart failure: Secondary | ICD-10-CM | POA: Diagnosis not present

## 2023-02-19 DIAGNOSIS — J441 Chronic obstructive pulmonary disease with (acute) exacerbation: Secondary | ICD-10-CM | POA: Diagnosis not present

## 2023-02-19 DIAGNOSIS — I10 Essential (primary) hypertension: Secondary | ICD-10-CM

## 2023-02-19 DIAGNOSIS — J9621 Acute and chronic respiratory failure with hypoxia: Secondary | ICD-10-CM | POA: Diagnosis not present

## 2023-02-19 LAB — COMPREHENSIVE METABOLIC PANEL
ALT: 32 U/L (ref 0–44)
AST: 20 U/L (ref 15–41)
Albumin: 2.7 g/dL — ABNORMAL LOW (ref 3.5–5.0)
Alkaline Phosphatase: 60 U/L (ref 38–126)
Anion gap: 10 (ref 5–15)
BUN: 21 mg/dL (ref 8–23)
CO2: 34 mmol/L — ABNORMAL HIGH (ref 22–32)
Calcium: 8.8 mg/dL — ABNORMAL LOW (ref 8.9–10.3)
Chloride: 91 mmol/L — ABNORMAL LOW (ref 98–111)
Creatinine, Ser: 0.9 mg/dL (ref 0.61–1.24)
GFR, Estimated: >60 mL/min (ref >60)
Glucose, Bld: 102 mg/dL — ABNORMAL HIGH (ref 70–99)
Potassium: 3.9 mmol/L (ref 3.5–5.1)
Sodium: 135 mmol/L (ref 135–145)
Total Bilirubin: 0.5 mg/dL (ref 0.3–1.2)
Total Protein: 5.8 g/dL — ABNORMAL LOW (ref 6.5–8.1)

## 2023-02-19 LAB — CBC WITH DIFFERENTIAL/PLATELET
Abs Immature Granulocytes: 0.2 10*3/uL — ABNORMAL HIGH (ref 0.00–0.07)
Basophils Absolute: 0 10*3/uL (ref 0.0–0.1)
Basophils Relative: 0 %
Eosinophils Absolute: 0 10*3/uL (ref 0.0–0.5)
Eosinophils Relative: 0 %
HCT: 33.6 % — ABNORMAL LOW (ref 39.0–52.0)
Hemoglobin: 10.7 g/dL — ABNORMAL LOW (ref 13.0–17.0)
Immature Granulocytes: 2 %
Lymphocytes Relative: 14 %
Lymphs Abs: 1.6 10*3/uL (ref 0.7–4.0)
MCH: 26.1 pg (ref 26.0–34.0)
MCHC: 31.8 g/dL (ref 30.0–36.0)
MCV: 82 fL (ref 80.0–100.0)
Monocytes Absolute: 0.8 10*3/uL (ref 0.1–1.0)
Monocytes Relative: 7 %
Neutro Abs: 8.5 10*3/uL — ABNORMAL HIGH (ref 1.7–7.7)
Neutrophils Relative %: 77 %
Platelets: 288 10*3/uL (ref 150–400)
RBC: 4.1 MIL/uL — ABNORMAL LOW (ref 4.22–5.81)
RDW: 14.8 % (ref 11.5–15.5)
WBC: 11.1 10*3/uL — ABNORMAL HIGH (ref 4.0–10.5)
nRBC: 0 % (ref 0.0–0.2)

## 2023-02-19 LAB — PHOSPHORUS: Phosphorus: 2.9 mg/dL (ref 2.5–4.6)

## 2023-02-19 LAB — MAGNESIUM: Magnesium: 2.2 mg/dL (ref 1.7–2.4)

## 2023-02-19 MED ORDER — IPRATROPIUM-ALBUTEROL 0.5-2.5 (3) MG/3ML IN SOLN
3.0000 mL | Freq: Three times a day (TID) | RESPIRATORY_TRACT | Status: DC
  Administered 2023-02-20: 3 mL via RESPIRATORY_TRACT
  Filled 2023-02-19: qty 3

## 2023-02-19 MED ORDER — FUROSEMIDE 10 MG/ML IJ SOLN
40.0000 mg | Freq: Two times a day (BID) | INTRAMUSCULAR | Status: DC
Start: 2023-02-19 — End: 2023-02-21
  Administered 2023-02-19 – 2023-02-21 (×4): 40 mg via INTRAVENOUS
  Filled 2023-02-19 (×4): qty 4

## 2023-02-19 MED ORDER — ALBUMIN HUMAN 25 % IV SOLN
12.5000 g | Freq: Once | INTRAVENOUS | Status: AC
  Administered 2023-02-19: 12.5 g via INTRAVENOUS
  Filled 2023-02-19: qty 50

## 2023-02-19 NOTE — Progress Notes (Signed)
OT Cancellation Note  Patient Details Name: EMAAD DOBIAS MRN: [id number] DOB: December 17, 1946   Cancelled Treatment:    Reason Eval/Treat Not Completed: OT screened, no needs identified, will sign off per chart, pt Independent with mobility up to 314f and no concerns with ADL mgmt. No formal OT eval needed at this time.   JLayla Maw2/28/2024, 7:01 AM

## 2023-02-19 NOTE — Progress Notes (Signed)
PROGRESS NOTE    Paul [name]  [id number] DOB: 15-Nov-1946 DOA: 02/15/2023 PCP: [name], MD  Chief Complaint  Patient presents with   Chest Pain    Brief Narrative:  77 y.o. male with past medical history of atrial fibrillation and COPD who is had 4 hospitalizations in the last 5 months secondary to COPD or CHF.  Patient most recently was hospitalized 10 days prior returned back on 2/24 for 24 hours of increased shortness of breath.  Patient found to have acute diastolic heart failure and started on IV Lasix as well as mild secondary COPD exacerbation.  Patient started on IV Lasix as well as steroids.  He has responded well to diuresis.  On 2/27, patient had a fall secondary to sudden onset syncopal event.  Not felt to be orthostatic.  Etiology is unclear.  Assessment & Plan:   Active Problems:   Acute on chronic respiratory failure with hypoxia (HCC)   Acute respiratory failure with hypoxia (HCC)  Addendum: pt found on bottom, fall, no apparent injuries per discussion with RN.  No head trauma.  Will order PT/OT imminent discharge to assess needs.  Addendum - when nurse walked him, c/o LH.  Negative orthostatics.  No tele events. Hold lasix for now.  Follow therapy.  Reassess in AM.  Acute on chronic respiratory failure-resolved Treating underlying COPD and heart failure.  Acute on Chronic Diastolic Heart Failure Continue IV Lasix.  Have added dose of IV albumin.  To date, patient is -6.5 L.  COPD Exacerbation COPD exacerbation has improved Tapering steroids steroids, scheduled and prn nebs Negative procal and respiratory viral panel Patient last saw pulmonary in the office on 2/19  Hyponatremia-resolved Suspect hypervolemic Follow with diuresis  Paroxysmal atrial fibrillation  - s/p Watchman in 2022 was noted to be in correct placement with no leaking on TEE in September 2022.  -intolerant of beta-blocker secondary to syncopal episodes and falls. -CHA2DS2-VASc Score  = 6      Coronary artery calcification - Stable with no anginal symptoms. No indication for ischemic evaluation.  Continue aspirin, carvedilol.   Hypertension  -recently started  on amlodipine 2.5 mg daily.      DVT prophylaxis: lovenox Code Status: full Family Communication: Will call wife Disposition:   Status is: Inpatient Remains inpatient appropriate because:  -Monitoring for further syncopal events -Fully diuresing   Consultants:  none  Procedures:  non  Antimicrobials:  Anti-infectives (From admission, onward)    None       Subjective: Feeling better, breathing closer to baseline  Objective: Vitals:   02/19/23 0714 02/19/23 0738 02/19/23 1231 02/19/23 1308  BP:  (!) 145/76 135/65   Pulse:  94 84   Resp:  16 17   Temp:  97.9 F (36.6 C) 97.9 F (36.6 C)   TempSrc:  Oral Oral   SpO2: 99% 98% 93% 98%  Weight:      Height:        Intake/Output Summary (Last 24 hours) at 02/19/2023 1410 Last data filed at 02/19/2023 1200 Gross per 24 hour  Intake --  Output 850 ml  Net -850 ml    Filed Weights   02/17/23 0530 02/18/23 0412 02/19/23 0629  Weight: 70.2 kg 69.4 kg 69.1 kg    Examination:  General: Alert and oriented x 3, no acute distress Cardiovascular: Regular rate and rhythm, S1-S2 Lungs: Clear to auscultation bilaterally Abdomen: Soft, nontender, nondistended, positive bowel sounds Neurological: No focal deficits Skin: No skin breaks, tears  or lesions Extremities: No clubbing or cyanosis or edema   Data Reviewed: I have personally reviewed following labs and imaging studies  CBC: Recent Labs  Lab 02/15/23 1404 02/16/23 0204 02/17/23 0201 02/18/23 0217 02/19/23 0213  WBC 17.8* 12.4* 14.6* 12.3* 11.1*  NEUTROABS 16.0*  --  13.1*  --  8.5*  HGB 11.5* 10.9* 10.9* 10.6* 10.7*  HCT 35.9* 33.0* 32.8* 32.5* 33.6*  MCV 81.0 79.5* 80.4 81.3 82.0  PLT 265 223 249 235 288     Basic Metabolic Panel: Recent Labs  Lab 02/15/23 1404  02/16/23 0204 02/17/23 0201 02/18/23 0217 02/19/23 0213  NA 132* 134* 133* 136 135  K 3.5 3.6 3.4* 4.4 3.9  CL 88* 89* 89* 92* 91*  CO2 35* 35* 35* 33* 34*  GLUCOSE 188* 190* 154* 127* 102*  BUN '19 16 20 22 21  '$ CREATININE 0.93 0.96 0.85 0.89 0.90  CALCIUM 8.0* 7.9* 7.9* 8.2* 8.8*  MG  --   --  2.3 2.3 2.2  PHOS  --   --  2.7 2.5 2.9     GFR: Estimated Creatinine Clearance: 62 mL/min (by C-G formula based on SCr of 0.9 mg/dL).  Liver Function Tests: Recent Labs  Lab 02/12/23 1416 02/15/23 1404 02/17/23 0201 02/19/23 0213  AST '26 30 26 20  '$ ALT 42 38 33 32  ALKPHOS 95 70 70 60  BILITOT 0.3 0.7 0.5 0.5  PROT 5.9* 6.6 5.7* 5.8*  ALBUMIN 3.9 3.1* 2.6* 2.7*     CBG: No results for input(s): "GLUCAP" in the last 168 hours.   Recent Results (from the past 240 hour(s))  Resp panel by RT-PCR (RSV, Flu A&B, Covid) Anterior Nasal Swab     Status: None   Collection Time: 02/15/23  2:04 PM   Specimen: Anterior Nasal Swab  Result Value Ref Range Status   SARS Coronavirus 2 by RT PCR NEGATIVE NEGATIVE Final    Comment: (NOTE) SARS-CoV-2 target nucleic acids are NOT DETECTED.  The SARS-CoV-2 RNA is generally detectable in upper respiratory specimens during the acute phase of infection. The lowest concentration of SARS-CoV-2 viral copies this assay can detect is 138 copies/mL. A negative result does not preclude SARS-Cov-2 infection and should not be used as the sole basis for treatment or other patient management decisions. A negative result may occur with  improper specimen collection/handling, submission of specimen other than nasopharyngeal swab, presence of viral mutation(s) within the areas targeted by this assay, and inadequate number of viral copies(<138 copies/mL). A negative result must be combined with clinical observations, patient history, and epidemiological information. The expected result is Negative.  Fact Sheet for Patients:   EntrepreneurPulse.com.au  Fact Sheet for Healthcare Providers:  IncredibleEmployment.be  This test is no t yet approved or cleared by the [place] FDA and  has been authorized for detection and/or diagnosis of SARS-CoV-2 by FDA under an Emergency Use Authorization (EUA). This EUA will remain  in effect (meaning this test can be used) for the duration of the COVID-19 declaration under Section 564(b)(1) of the Act, 21 U.S.C.section 360bbb-3(b)(1), unless the authorization is terminated  or revoked sooner.       Influenza A by PCR NEGATIVE NEGATIVE Final   Influenza B by PCR NEGATIVE NEGATIVE Final    Comment: (NOTE) The Xpert Xpress SARS-CoV-2/FLU/RSV plus assay is intended as an aid in the diagnosis of influenza from Nasopharyngeal swab specimens and should not be used as a sole basis for treatment. Nasal washings and aspirates are  unacceptable for Xpert Xpress SARS-CoV-2/FLU/RSV testing.  Fact Sheet for Patients: EntrepreneurPulse.com.au  Fact Sheet for Healthcare Providers: IncredibleEmployment.be  This test is not yet approved or cleared by the [place] FDA and has been authorized for detection and/or diagnosis of SARS-CoV-2 by FDA under an Emergency Use Authorization (EUA). This EUA will remain in effect (meaning this test can be used) for the duration of the COVID-19 declaration under Section 564(b)(1) of the Act, 21 U.S.C. section 360bbb-3(b)(1), unless the authorization is terminated or revoked.     Resp Syncytial Virus by PCR NEGATIVE NEGATIVE Final    Comment: (NOTE) Fact Sheet for Patients: EntrepreneurPulse.com.au  Fact Sheet for Healthcare Providers: IncredibleEmployment.be  This test is not yet approved or cleared by the [place] FDA and has been authorized for detection and/or diagnosis of SARS-CoV-2 by FDA under an Emergency Use  Authorization (EUA). This EUA will remain in effect (meaning this test can be used) for the duration of the COVID-19 declaration under Section 564(b)(1) of the Act, 21 U.S.C. section 360bbb-3(b)(1), unless the authorization is terminated or revoked.  Performed at [facility], [address] [place], [place] [address]          Radiology Studies: No results found.      Scheduled Meds:  amLODipine  2.5 mg Oral Daily   vitamin C  1,000 mg Oral Daily   aspirin EC  81 mg Oral Daily   atorvastatin  40 mg Oral QHS   calcium carbonate  625 mg Oral Q breakfast   carvedilol  3.125 mg Oral BID WC   cholecalciferol  2,000 Units Oral Daily   digoxin  0.125 mg Oral Daily   enoxaparin (LOVENOX) injection  40 mg Subcutaneous Q24H   furosemide  40 mg Intravenous BID   ipratropium-albuterol  3 mL Nebulization Q6H WA   pantoprazole  40 mg Oral BID   predniSONE  30 mg Oral Q breakfast   Followed by   Derrill Memo ON 02/22/2023] predniSONE  20 mg Oral Q breakfast   Followed by   Derrill Memo ON 02/25/2023] predniSONE  10 mg Oral Q breakfast   sertraline  50 mg Oral QHS   Continuous Infusions:  sodium chloride Stopped (02/15/23 1506)   albumin human       LOS: 4 days      Annita Brod, MD [facility]   To contact the attending provider between 7A-7P or the covering provider during after hours 7P-7A, please log into the web site [email or web address] and access using universal [place] password for that web site. If you do not have the password, please call the hospital operator.  02/19/2023, 2:10 PM

## 2023-02-19 NOTE — Progress Notes (Signed)
Mobility Specialist Progress Note:   02/19/23 0940  Mobility  Activity Ambulated with assistance in hallway  Level of Assistance Contact guard assist, steadying assist  Assistive Device None  Distance Ambulated (ft) 400 ft  Activity Response Tolerated well  Mobility Referral Yes  $Mobility charge 1 Mobility   Pt eager for mobility session. Required no physical assistance, only minG for safety. SpO2 >88% throughout on RA. Pt back in chair with all needs met.   Nelta Numbers Mobility Specialist Please contact via SecureChat or  Rehab office at [phone]

## 2023-02-19 NOTE — Plan of Care (Signed)

## 2023-02-20 DIAGNOSIS — J441 Chronic obstructive pulmonary disease with (acute) exacerbation: Secondary | ICD-10-CM | POA: Diagnosis not present

## 2023-02-20 DIAGNOSIS — J9621 Acute and chronic respiratory failure with hypoxia: Secondary | ICD-10-CM | POA: Diagnosis not present

## 2023-02-20 DIAGNOSIS — I10 Essential (primary) hypertension: Secondary | ICD-10-CM | POA: Diagnosis not present

## 2023-02-20 DIAGNOSIS — I5031 Acute diastolic (congestive) heart failure: Secondary | ICD-10-CM | POA: Diagnosis not present

## 2023-02-20 LAB — BASIC METABOLIC PANEL
Anion gap: 11 (ref 5–15)
BUN: 16 mg/dL (ref 8–23)
CO2: 35 mmol/L — ABNORMAL HIGH (ref 22–32)
Calcium: 9.5 mg/dL (ref 8.9–10.3)
Chloride: 91 mmol/L — ABNORMAL LOW (ref 98–111)
Creatinine, Ser: 1.03 mg/dL (ref 0.61–1.24)
GFR, Estimated: >60 mL/min (ref >60)
Glucose, Bld: 112 mg/dL — ABNORMAL HIGH (ref 70–99)
Potassium: 5.1 mmol/L (ref 3.5–5.1)
Sodium: 137 mmol/L (ref 135–145)

## 2023-02-20 MED ORDER — IPRATROPIUM-ALBUTEROL 0.5-2.5 (3) MG/3ML IN SOLN
3.0000 mL | Freq: Two times a day (BID) | RESPIRATORY_TRACT | Status: DC
  Administered 2023-02-20 – 2023-02-21 (×2): 3 mL via RESPIRATORY_TRACT
  Filled 2023-02-20 (×2): qty 3

## 2023-02-20 NOTE — Progress Notes (Signed)
Mobility Specialist Progress Note:   02/20/23 0900  Mobility  Activity Ambulated with assistance in hallway  Level of Assistance Standby assist, set-up cues, supervision of patient - no hands on  Assistive Device None  Distance Ambulated (ft) 400 ft  Activity Response Tolerated well  Mobility Referral Yes  $Mobility charge 1 Mobility   Pt agreeable to mobility session. Required no physical assistance throughout. SpO2 WFL on RA during ambulation. Pt back in chair with all needs met.   Nelta Numbers Mobility Specialist Please contact via SecureChat or  Rehab office at [phone]

## 2023-02-20 NOTE — Consult Note (Signed)
   [facility] Inpatient Consult   02/20/2023  Paul [name] 11/07/1946 [id number]  Orientation with Natividad Brood, [facility] Liaison for review.  [facility]) Accountable Care Organization ([place]) Patient:Insurance (Medicare ACO)  Primary Care Provider:Cox, Kirsten, MD at [place]   Location: [facility] Liaison met with pt via bedside [facility]).   Patient (pt) screened for less than 30 days readmission hospitalization with noted extreme high risk score for unplanned readmission  with 4 IP in 6 months. Met with pt to assess for potential [facility]) Care Management service needs for post hospital transition for care coordination. Reiterated on [facility] services along with information provided for the 24 hour concierge nurse advise line and provided a magnet. Pt has a history with [facility] services and receptive to post op follow up calls.  Pt states he was with [facility] for Palliative services that stopped however pt indicates they will resume services upon his discharge from the hospital.   Plan:  HIPAA verified and [facility] RN will continue to follow ongoing disposition to assess for post hospital community care coordination/management needs. Will alert [facility] team member of pt's discharge disposition.    [place] does not replace or interfere with any arrangements made by the Inpatient Transition of Care team.  For any inquires, please contact:  Raina Mina, RN, [place] Hours M-F 8:00 am to 4:30 pm [phone] [Office toll free line][facility] Office Hours are M-F 8:30 - 5 pm 24 hour nurse advise line [phone] Conceirge  [email or web address]

## 2023-02-20 NOTE — Progress Notes (Signed)
Mobility Specialist Progress Note:   02/20/23 1620  Mobility  Activity Ambulated with assistance in hallway  Level of Assistance Standby assist, set-up cues, supervision of patient - no hands on  Assistive Device None  Distance Ambulated (ft) 400 ft  Activity Response Tolerated well  Mobility Referral Yes  $Mobility charge 1 Mobility   Pt eager for mobility session. Required no physical assistance throughout. SpO2 90-95% on RA. Pt left in bed with all needs met.   Nelta Numbers Mobility Specialist Please contact via SecureChat or  Rehab office at [phone]

## 2023-02-20 NOTE — Progress Notes (Signed)
PROGRESS NOTE    Paul [name]  [id number] DOB: December 22, 1946 DOA: 02/15/2023 PCP: [name], MD  Chief Complaint  Patient presents with   Chest Pain    Brief Narrative:  77 y.o. male with past medical history of atrial fibrillation and COPD who is had 4 hospitalizations in the last 5 months secondary to COPD or CHF.  Patient most recently was hospitalized 10 days prior returned back on 2/24 for 24 hours of increased shortness of breath.  Patient found to have acute diastolic heart failure and started on IV Lasix as well as mild secondary COPD exacerbation.  Patient started on IV Lasix as well as steroids.  He has responded well to diuresis.  On 2/27, patient had a fall secondary to sudden onset syncopal event.  Not felt to be orthostatic.  Etiology is unclear.  Assessment & Plan:   Acute on chronic respiratory failure-resolved Treating underlying COPD and heart failure.  Acute on Chronic Diastolic Heart Failure Continue IV Lasix.  Have added dose of IV albumin.  Previous recurrent hospitalizations, we will get patient as close as possible to dry weight.  He is currently down to 152 pounds, 2 pounds less then any of his previous recent discharges.  He tells me that a few years ago, his dry weight was right around 149-150.  He is currently -7.75 L deficient  COPD Exacerbation COPD exacerbation has improved Tapering steroids steroids, scheduled and prn nebs Negative procal and respiratory viral panel Patient last saw pulmonary in the office on 2/19  Hyponatremia-resolved Suspect hypervolemic Follow with diuresis  Paroxysmal atrial fibrillation  - s/p Watchman in 2022 was noted to be in correct placement with no leaking on TEE in September 2022.  -intolerant of beta-blocker secondary to syncopal episodes and falls. -CHA2DS2-VASc Score = 6      Coronary artery calcification - Stable with no anginal symptoms. No indication for ischemic evaluation.  Continue aspirin, carvedilol.    Hypertension  -recently started  on amlodipine 2.5 mg daily.      DVT prophylaxis: lovenox Code Status: full Family Communication: Wife at bedside Disposition:   Status is: Inpatient Remains inpatient appropriate because:  Fully diuresed to dry weight   Consultants:  none  Procedures:  non  Antimicrobials:  Anti-infectives (From admission, onward)    None       Subjective: Continued to feel better from breathing standpoint  Objective: Vitals:   02/20/23 0714 02/20/23 0750 02/20/23 1147 02/20/23 1320  BP: (!) 149/62  (!) 144/57 (!) 140/57  Pulse: 85  90 82  Resp: '18 16 18 18  '$ Temp: 97.6 F (36.4 C)  98.4 F (36.9 C) 98.3 F (36.8 C)  TempSrc: Oral  Oral Oral  SpO2: 100% 99%  96%  Weight:      Height:        Intake/Output Summary (Last 24 hours) at 02/20/2023 1450 Last data filed at 02/20/2023 1010 Gross per 24 hour  Intake 40 ml  Output 1275 ml  Net -1235 ml    Filed Weights   02/17/23 0530 02/18/23 0412 02/19/23 0629  Weight: 70.2 kg 69.4 kg 69.1 kg    Examination:  General: Alert and oriented x 3, no acute distress Cardiovascular: Regular rate and rhythm, S1-S2 Lungs: Clear to auscultation bilaterally Abdomen: Soft, nontender, nondistended, positive bowel sounds Neurological: No focal deficits Skin: No skin breaks, tears or lesions Extremities: No clubbing or cyanosis or edema   Data Reviewed: I have personally reviewed following labs and imaging  studies  CBC: Recent Labs  Lab 02/15/23 1404 02/16/23 0204 02/17/23 0201 02/18/23 0217 02/19/23 0213  WBC 17.8* 12.4* 14.6* 12.3* 11.1*  NEUTROABS 16.0*  --  13.1*  --  8.5*  HGB 11.5* 10.9* 10.9* 10.6* 10.7*  HCT 35.9* 33.0* 32.8* 32.5* 33.6*  MCV 81.0 79.5* 80.4 81.3 82.0  PLT 265 223 249 235 288     Basic Metabolic Panel: Recent Labs  Lab 02/16/23 0204 02/17/23 0201 02/18/23 0217 02/19/23 0213 02/20/23 0202  NA 134* 133* 136 135 137  K 3.6 3.4* 4.4 3.9 5.1  CL 89* 89* 92*  91* 91*  CO2 35* 35* 33* 34* 35*  GLUCOSE 190* 154* 127* 102* 112*  BUN '16 20 22 21 16  '$ CREATININE 0.96 0.85 0.89 0.90 1.03  CALCIUM 7.9* 7.9* 8.2* 8.8* 9.5  MG  --  2.3 2.3 2.2  --   PHOS  --  2.7 2.5 2.9  --      GFR: Estimated Creatinine Clearance: 54.2 mL/min (by C-G formula based on SCr of 1.03 mg/dL).  Liver Function Tests: Recent Labs  Lab 02/15/23 1404 02/17/23 0201 02/19/23 0213  AST '30 26 20  '$ ALT 38 33 32  ALKPHOS 70 70 60  BILITOT 0.7 0.5 0.5  PROT 6.6 5.7* 5.8*  ALBUMIN 3.1* 2.6* 2.7*     CBG: No results for input(s): "GLUCAP" in the last 168 hours.   Recent Results (from the past 240 hour(s))  Resp panel by RT-PCR (RSV, Flu A&B, Covid) Anterior Nasal Swab     Status: None   Collection Time: 02/15/23  2:04 PM   Specimen: Anterior Nasal Swab  Result Value Ref Range Status   SARS Coronavirus 2 by RT PCR NEGATIVE NEGATIVE Final    Comment: (NOTE) SARS-CoV-2 target nucleic acids are NOT DETECTED.  The SARS-CoV-2 RNA is generally detectable in upper respiratory specimens during the acute phase of infection. The lowest concentration of SARS-CoV-2 viral copies this assay can detect is 138 copies/mL. A negative result does not preclude SARS-Cov-2 infection and should not be used as the sole basis for treatment or other patient management decisions. A negative result may occur with  improper specimen collection/handling, submission of specimen other than nasopharyngeal swab, presence of viral mutation(s) within the areas targeted by this assay, and inadequate number of viral copies(<138 copies/mL). A negative result must be combined with clinical observations, patient history, and epidemiological information. The expected result is Negative.  Fact Sheet for Patients:  EntrepreneurPulse.com.au  Fact Sheet for Healthcare Providers:  IncredibleEmployment.be  This test is no t yet approved or cleared by the [place]  FDA and  has been authorized for detection and/or diagnosis of SARS-CoV-2 by FDA under an Emergency Use Authorization (EUA). This EUA will remain  in effect (meaning this test can be used) for the duration of the COVID-19 declaration under Section 564(b)(1) of the Act, 21 U.S.C.section 360bbb-3(b)(1), unless the authorization is terminated  or revoked sooner.       Influenza A by PCR NEGATIVE NEGATIVE Final   Influenza B by PCR NEGATIVE NEGATIVE Final    Comment: (NOTE) The Xpert Xpress SARS-CoV-2/FLU/RSV plus assay is intended as an aid in the diagnosis of influenza from Nasopharyngeal swab specimens and should not be used as a sole basis for treatment. Nasal washings and aspirates are unacceptable for Xpert Xpress SARS-CoV-2/FLU/RSV testing.  Fact Sheet for Patients: EntrepreneurPulse.com.au  Fact Sheet for Healthcare Providers: IncredibleEmployment.be  This test is not yet approved or cleared by  the [employer] and has been authorized for detection and/or diagnosis of SARS-CoV-2 by FDA under an Emergency Use Authorization (EUA). This EUA will remain in effect (meaning this test can be used) for the duration of the COVID-19 declaration under Section 564(b)(1) of the Act, 21 U.S.C. section 360bbb-3(b)(1), unless the authorization is terminated or revoked.     Resp Syncytial Virus by PCR NEGATIVE NEGATIVE Final    Comment: (NOTE) Fact Sheet for Patients: EntrepreneurPulse.com.au  Fact Sheet for Healthcare Providers: IncredibleEmployment.be  This test is not yet approved or cleared by the [place] FDA and has been authorized for detection and/or diagnosis of SARS-CoV-2 by FDA under an Emergency Use Authorization (EUA). This EUA will remain in effect (meaning this test can be used) for the duration of the COVID-19 declaration under Section 564(b)(1) of the Act, 21 U.S.C. section  360bbb-3(b)(1), unless the authorization is terminated or revoked.  Performed at [facility], [address] [place], [place] [address]          Radiology Studies: No results found.      Scheduled Meds:  amLODipine  2.5 mg Oral Daily   vitamin C  1,000 mg Oral Daily   aspirin EC  81 mg Oral Daily   atorvastatin  40 mg Oral QHS   calcium carbonate  625 mg Oral Q breakfast   carvedilol  3.125 mg Oral BID WC   cholecalciferol  2,000 Units Oral Daily   digoxin  0.125 mg Oral Daily   enoxaparin (LOVENOX) injection  40 mg Subcutaneous Q24H   furosemide  40 mg Intravenous BID   ipratropium-albuterol  3 mL Nebulization BID   pantoprazole  40 mg Oral BID   predniSONE  30 mg Oral Q breakfast   Followed by   Derrill Memo ON 02/22/2023] predniSONE  20 mg Oral Q breakfast   Followed by   Derrill Memo ON 02/25/2023] predniSONE  10 mg Oral Q breakfast   sertraline  50 mg Oral QHS   Continuous Infusions:  sodium chloride Stopped (02/15/23 1506)     LOS: 5 days      Annita Brod, MD [facility]   To contact the attending provider between 7A-7P or the covering provider during after hours 7P-7A, please log into the web site [email or web address] and access using universal [place] password for that web site. If you do not have the password, please call the hospital operator.  02/20/2023, 2:50 PM

## 2023-02-21 DIAGNOSIS — J9621 Acute and chronic respiratory failure with hypoxia: Secondary | ICD-10-CM | POA: Diagnosis not present

## 2023-02-21 DIAGNOSIS — J441 Chronic obstructive pulmonary disease with (acute) exacerbation: Secondary | ICD-10-CM | POA: Diagnosis not present

## 2023-02-21 DIAGNOSIS — I10 Essential (primary) hypertension: Secondary | ICD-10-CM | POA: Diagnosis not present

## 2023-02-21 DIAGNOSIS — I5033 Acute on chronic diastolic (congestive) heart failure: Secondary | ICD-10-CM | POA: Diagnosis not present

## 2023-02-21 LAB — BASIC METABOLIC PANEL
Anion gap: 11 (ref 5–15)
BUN: 23 mg/dL (ref 8–23)
CO2: 35 mmol/L — ABNORMAL HIGH (ref 22–32)
Calcium: 9.3 mg/dL (ref 8.9–10.3)
Chloride: 89 mmol/L — ABNORMAL LOW (ref 98–111)
Creatinine, Ser: 1.12 mg/dL (ref 0.61–1.24)
GFR, Estimated: >60 mL/min (ref >60)
Glucose, Bld: 109 mg/dL — ABNORMAL HIGH (ref 70–99)
Potassium: 4.3 mmol/L (ref 3.5–5.1)
Sodium: 135 mmol/L (ref 135–145)

## 2023-02-21 MED ORDER — PREDNISONE 10 MG PO TABS: ORAL_TABLET | ORAL | 0 refills | Status: AC

## 2023-02-21 MED ORDER — AMLODIPINE BESYLATE 2.5 MG PO TABS: 2.5000 mg | ORAL_TABLET | Freq: Every day | ORAL | 3 refills | Status: DC

## 2023-02-21 MED ORDER — FUROSEMIDE 40 MG PO TABS: 40.0000 mg | ORAL_TABLET | Freq: Every day | ORAL | 11 refills | Status: DC | PRN

## 2023-02-21 NOTE — Progress Notes (Signed)
Mobility Specialist Progress Note:   02/21/23 0930  Mobility  Activity Ambulated with assistance in hallway  Level of Assistance Standby assist, set-up cues, supervision of patient - no hands on  Assistive Device None  Distance Ambulated (ft) 400 ft  Activity Response Tolerated well  Mobility Referral Yes  $Mobility charge 1 Mobility   Pt eager for mobility session. Required no physical assistance, only supervision for safety. Pt desat to 87% on RA, recovered with standing rest break and PLB. Pt back in chair with all needs met. Encouraged frequent ambulation.  Nelta Numbers Mobility Specialist Please contact via SecureChat or  Rehab office at [phone]

## 2023-02-21 NOTE — Discharge Summary (Signed)
Physician Discharge Summary   Patient: Paul [name] MRN: [id number] DOB: 12-Aug-1946  Admit date:     02/15/2023  Discharge date: 02/21/23  Discharge Physician: Annita Brod   PCP: Paul Brome, MD   Recommendations at discharge:   Medication change: Patient continued on Lasix 40 mg p.o. twice daily.  He is also been advised to take an additional dose of Lasix 40 mg should his weight go up by more than 2 pounds per day or more than 3 pounds every 2 days. Patient given refill for his Norvasc as per his request. New medication: Prednisone taper Patient will follow-up with his cardiologist in the next few weeks  Discharge Diagnoses: Active Problems: Chronic diastolic heart failure Paroxysmal atrial fibrillation COPD Essential hypertension  Resolved Problems: Acute on chronic diastolic heart failure COPD exacerbation Acute respiratory failure with hypoxia  Hospital Course: 76 y.o. male with past medical history of atrial fibrillation and COPD who is had 4 hospitalizations in the last 5 months secondary to COPD or CHF.  Patient most recently was hospitalized 10 days prior returned back on 2/24 for 24 hours of increased shortness of breath.  Patient found to have acute diastolic heart failure and started on IV Lasix as well as mild secondary COPD exacerbation.  Patient started on IV Lasix as well as steroids.  He has responded well to diuresis.  On 2/27, patient had a fall secondary to sudden onset syncopal event.  Not felt to be orthostatic.  Etiology is unclear.   Assessment and Plan: Acute on chronic respiratory failure-resolved Treating underlying COPD and heart failure.   Acute on Chronic Diastolic Heart Failure Given previous recurrent hospitalizations, more aggressive diuresis at this time getting patient is close as possible to his dry weight.  Patient related that several years ago, his dry weight was around 149.  He had not had this checked in some time.  In review of his  recent hospitalizations, weight upon discharge ranged from 1 52-1 54.  With continued IV Lasix plus doses of IV albumin, patient's weight at time of discharge was 150.7 pounds.  Patient will continue on Lasix 40 mg p.o. twice daily, but have advised him to check his weight daily which she says he does and additional dose of Lasix to be given for weight above 2 pounds in 1 day or 3 pounds over 2 days.  Patient will follow-up with his cardiologist in the next few weeks.  COPD Exacerbation COPD exacerbation has improved Patient discharged on remaining steroid taper.  Scheduled and prn nebs Negative procal and respiratory viral panel Patient last saw pulmonary in the office on 2/19   Hyponatremia-resolved Suspect hypervolemic Follow with diuresis   Paroxysmal atrial fibrillation  - s/p Watchman in 2022 was noted to be in correct placement with no leaking on TEE in September 2022.  -intolerant of beta-blocker secondary to syncopal episodes and falls. -CHA2DS2-VASc Score = 6      Coronary artery calcification - Stable with no anginal symptoms. No indication for ischemic evaluation.  Continue aspirin, carvedilol.   Hypertension  -recently started  on amlodipine 2.5 mg daily.             Consultants: None Procedures performed: None Disposition: Home Diet recommendation:  Discharge Diet Orders (From admission, onward)     Start     Ordered   02/21/23 0000  Diet - low sodium heart healthy        02/21/23 1328  Cardiac diet DISCHARGE MEDICATION: Allergies as of 02/21/2023       Reactions   Tape Other (See Comments)   PLEASE USE COBAN WRAP IN LIEU OF "TAPE," as it "pulls off the skin!!   Daliresp [roflumilast] Diarrhea, Nausea And Vomiting   Levaquin [levofloxacin] Palpitations, Other (See Comments)   Made the B/P fluctuate and heart raced   Alendronate Anxiety, Other (See Comments)   Jittery/nervous        Medication List     TAKE these medications     acetaminophen 500 MG tablet Commonly known as: TYLENOL Take 1,000 mg by mouth at bedtime as needed (pain).   ALPRAZolam 0.5 MG tablet Commonly known as: XANAX Take 1 tablet (0.5 mg total) by mouth 2 (two) times daily as needed for anxiety.   amLODipine 2.5 MG tablet Commonly known as: NORVASC Take 1 tablet (2.5 mg total) by mouth daily.   aspirin EC 81 MG tablet Take 1 tablet (81 mg total) by mouth daily. Swallow whole.   atorvastatin 40 MG tablet Commonly known as: LIPITOR TAKE 1 TABLET BY MOUTH EVERYDAY AT BEDTIME What changed: See the new instructions.   Breztri Aerosphere 160-9-4.8 MCG/ACT Aero Generic drug: Budeson-Glycopyrrol-Formoterol Inhale 2 puffs into the lungs 2 (two) times daily.   calcium carbonate 600 MG Tabs tablet Commonly known as: OS-CAL Take 600 mg by mouth daily.   carvedilol 3.125 MG tablet Commonly known as: COREG Take 1 tablet (3.125 mg total) by mouth 2 (two) times daily with a meal.   denosumab 60 MG/ML Sosy injection Commonly known as: PROLIA Inject 60 mg into the skin every 6 (six) months.   digoxin 0.125 MG tablet Commonly known as: LANOXIN Take 1 tablet (0.125 mg total) by mouth daily.   ferrous sulfate 325 (65 FE) MG tablet Take 325 mg by mouth 2 (two) times daily with a meal.   fluticasone 50 MCG/ACT nasal spray Commonly known as: FLONASE Place 2 sprays into both nostrils daily as needed for allergies or rhinitis.   furosemide 40 MG tablet Commonly known as: LASIX Take 1 tablet (40 mg total) by mouth 2 (two) times daily. What changed: Another medication with the same name was added. Make sure you understand how and when to take each.   furosemide 40 MG tablet Commonly known as: Lasix Take 1 tablet (40 mg total) by mouth daily as needed (Take additional dose of Lasix if weight greater than 2 pounds from previous day or greater than 3 pounds since previous 2 days). What changed: You were already taking a medication with the same  name, and this prescription was added. Make sure you understand how and when to take each.   loratadine 10 MG tablet Commonly known as: CLARITIN Take 10 mg by mouth in the morning.   Melatonin 12 MG Tabs Take 12 mg by mouth at bedtime.   multivitamin with minerals Tabs tablet Take 1 tablet by mouth daily with breakfast.   OXYGEN Inhale 2 L/min into the lungs at bedtime.   pantoprazole 40 MG tablet Commonly known as: PROTONIX Take 1 tablet (40 mg total) by mouth 2 (two) times daily.   predniSONE 10 MG tablet Commonly known as: DELTASONE Take 2 tablets (20 mg total) by mouth daily with breakfast for 3 days, THEN 1 tablet (10 mg total) daily with breakfast for 3 days. Start taking on: February 22, 2023   ProAir HFA 108 (90 Base) MCG/ACT inhaler Generic drug: albuterol Inhale 2 puffs into the lungs as needed  for wheezing or shortness of breath.   albuterol (2.5 MG/3ML) 0.083% nebulizer solution Commonly known as: PROVENTIL Take 3 mLs (2.5 mg total) by nebulization every 6 (six) hours as needed for shortness of breath or wheezing.   Probiotic Daily Caps Take 1 capsule by mouth in the morning.   sertraline 50 MG tablet Commonly known as: ZOLOFT Take 1 tablet (50 mg total) by mouth at bedtime.   vitamin C 1000 MG tablet Take 1,000 mg by mouth daily.   Vitamin D3 50 MCG (2000 UT) Tabs Generic drug: Cholecalciferol Take 2,000 Units by mouth in the morning.        Follow-up Information     [facility] Follow up.   Why: Outpatient Palliative Services-office will call you at the end of MARCH to start care. Contact information: [place] [phone]               Discharge Exam: Filed Weights   02/18/23 [id number] 02/19/23 0629 02/21/23 0445  Weight: 69.4 kg 69.1 kg 68.4 kg   General: Alert and oriented x 3, no acute distress Cardiovascular: Regular rate and rhythm, S1-S2  Condition at discharge:  good  The results of significant diagnostics from this hospitalization (including imaging, microbiology, ancillary and laboratory) are listed below for reference.   Imaging Studies: DG Chest Portable 1 View  Result Date: 02/15/2023 CLINICAL DATA:  Chest pain and shortness of breath for 2 days. EXAM: PORTABLE CHEST 1 VIEW COMPARISON:  None Available. FINDINGS: The heart size and mediastinal contours are within normal limits. Aortic atherosclerotic calcification incidentally noted. Both lungs are clear. Pulmonary hyperinflation again seen, consistent with COPD. IMPRESSION: COPD. No active disease. Electronically Signed   By: Marlaine Hind M.D.   On: 02/15/2023 14:27   CT Angio Chest PE W and/or Wo Contrast  Result Date: 02/04/2023 CLINICAL DATA:  Shortness of breath x2 days, bilateral lower extremity swelling, recent hospitalization for AFib, evaluate for PE EXAM: CT ANGIOGRAPHY CHEST WITH CONTRAST TECHNIQUE: Multidetector CT imaging of the chest was performed using the standard protocol during bolus administration of intravenous contrast. Multiplanar CT image reconstructions and MIPs were obtained to evaluate the vascular anatomy. RADIATION DOSE REDUCTION: This exam was performed according to the departmental dose-optimization program which includes automated exposure control, adjustment of the mA and/or kV according to patient size and/or use of iterative reconstruction technique. CONTRAST:  51m OMNIPAQUE IOHEXOL 350 MG/ML SOLN COMPARISON:  CTA chest dated 01/25/2023 FINDINGS: Cardiovascular: Satisfactory opacification the bilateral pulmonary arteries to the segmental level. No evidence of pulmonary embolism. Although not tailored for evaluation of the thoracic aorta, there is no evidence thoracic aortic aneurysm or dissection. Atherosclerotic calcifications of the aortic arch. Moderate three-vessel coronary atherosclerosis. Mediastinum/Nodes: No suspicious mediastinal lymphadenopathy. Visualized thyroid  is unremarkable. Lungs/Pleura: Biapical pleural-parenchymal scarring. Moderate centrilobular and paraseptal emphysematous changes, upper lung predominant. No suspicious pulmonary nodules. No focal consolidation. No pleural effusion or pneumothorax. Upper Abdomen: Visualized upper abdomen is grossly unremarkable, noting vascular calcifications. Musculoskeletal: Moderate to severe compression fracture deformities at T4, T5, T7, and T8, chronic. Prior vertebral augmentation at L1. Review of the MIP images confirms the above findings. IMPRESSION: No evidence of pulmonary embolism. No evidence of acute cardiopulmonary disease. Aortic Atherosclerosis (ICD10-I70.0) and Emphysema (ICD10-J43.9). Electronically Signed   By: SJulian HyM.D.   On: 02/04/2023 11:48   DG Chest 2 View  Result Date: 02/04/2023 CLINICAL DATA:  cp EXAM: CHEST - 2 VIEW COMPARISON:  01/25/2023.  FINDINGS: The heart size and mediastinal contours are within normal limits. Both lungs are clear. No pneumothorax or pleural effusion. Aorta is calcified. There are thoracic degenerative changes. IMPRESSION: No active cardiopulmonary disease. Electronically Signed   By: Sammie Bench M.D.   On: 02/04/2023 10:48   VAS [place] LOWER EXTREMITY VENOUS (DVT)  Result Date: 01/27/2023  Lower Venous DVT Study Patient Name:  Paul [name]  Date of Exam:   01/26/2023 Medical Rec #: [id number]       Accession #:    [id number] Date of Birth: Jul 27, 1946        Patient Gender: M Patient Age:   87 years Exam Location:  [facility] Procedure:      VAS [place] LOWER EXTREMITY VENOUS (DVT) Referring Phys: Nyoka Lint DOUTOVA --------------------------------------------------------------------------------  Indications: Edema.  Comparison Study: No prior studies. Performing Technologist: Darlin Coco RDMS, RVT  Examination Guidelines: A complete evaluation includes B-mode imaging, spectral Doppler, color Doppler, and power Doppler as needed of all accessible portions of  each vessel. Bilateral testing is considered an integral part of a complete examination. Limited examinations for reoccurring indications may be performed as noted. The reflux portion of the exam is performed with the patient in reverse Trendelenburg.  +---------+---------------+---------+-----------+----------+--------------+ RIGHT    CompressibilityPhasicitySpontaneityPropertiesThrombus Aging +---------+---------------+---------+-----------+----------+--------------+ CFV      Full           Yes      Yes                                 +---------+---------------+---------+-----------+----------+--------------+ SFJ      Full                                                        +---------+---------------+---------+-----------+----------+--------------+ FV Prox  Full                                                        +---------+---------------+---------+-----------+----------+--------------+ FV Mid   Full                                                        +---------+---------------+---------+-----------+----------+--------------+ FV DistalFull                                                        +---------+---------------+---------+-----------+----------+--------------+ PFV      Full                                                        +---------+---------------+---------+-----------+----------+--------------+ POP      Full  Yes      Yes                                 +---------+---------------+---------+-----------+----------+--------------+ PTV      Full                                                        +---------+---------------+---------+-----------+----------+--------------+ PERO     Full                                                        +---------+---------------+---------+-----------+----------+--------------+   +---------+---------------+---------+-----------+----------+--------------+ LEFT      CompressibilityPhasicitySpontaneityPropertiesThrombus Aging +---------+---------------+---------+-----------+----------+--------------+ CFV      Full           Yes      Yes                                 +---------+---------------+---------+-----------+----------+--------------+ SFJ      Full                                                        +---------+---------------+---------+-----------+----------+--------------+ FV Prox  Full                                                        +---------+---------------+---------+-----------+----------+--------------+ FV Mid   Full                                                        +---------+---------------+---------+-----------+----------+--------------+ FV DistalFull                                                        +---------+---------------+---------+-----------+----------+--------------+ PFV      Full                                                        +---------+---------------+---------+-----------+----------+--------------+ POP      Full           Yes      Yes                                 +---------+---------------+---------+-----------+----------+--------------+ PTV  Full                                                        +---------+---------------+---------+-----------+----------+--------------+ PERO     Full                                                        +---------+---------------+---------+-----------+----------+--------------+     Summary: RIGHT: - There is no evidence of deep vein thrombosis in the lower extremity.  - No cystic structure found in the popliteal fossa.  LEFT: - There is no evidence of deep vein thrombosis in the lower extremity.  - No cystic structure found in the popliteal fossa.  *See table(s) above for measurements and observations. Electronically signed by Servando Snare MD on 01/27/2023 at 2:40:04 PM.    Final    ECHOCARDIOGRAM  COMPLETE  Result Date: 01/26/2023    ECHOCARDIOGRAM REPORT   Patient Name:   Paul [name] Date of Exam: 01/26/2023 Medical Rec #:  [id number]      Height:       66.0 in Accession #:    [id number]     Weight:       161.6 lb Date of Birth:  Dec 06, 1946       BSA:          1.827 m Patient Age:    14 years       BP:           120/50 mmHg Patient Gender: M              HR:           84 bpm. Exam Location:  Inpatient Procedure: 2D Echo, Cardiac Doppler, Color Doppler and Intracardiac            Opacification Agent Indications:    Elevated troponin  History:        Patient has prior history of Echocardiogram examinations, most                 recent 09/13/2021. COPD, Arrythmias:Atrial Fibrillation; Risk                 Factors:Diabetes and Hypertension. 34m Watchman Device                 implanted 08/02/21.  Sonographer:    AClayton LefortRDCS (AE) Referring Phys: AToy Baker Sonographer Comments: Technically difficult study due to poor echo windows. IMPRESSIONS  1. Left ventricular ejection fraction, by estimation, is 65 to 70%. The left ventricle has normal function. The left ventricle has no regional wall motion abnormalities. Left ventricular diastolic parameters are consistent with Grade I diastolic dysfunction (impaired relaxation).  2. Right ventricular systolic function is low normal. The right ventricular size is normal. The estimated right ventricular systolic pressure is [id number].  3. The mitral valve is grossly normal. Trivial mitral valve regurgitation.  4. The aortic valve is tricuspid. Aortic valve regurgitation is trivial. Aortic valve sclerosis is present, with no evidence of aortic valve stenosis.  5. The inferior vena cava is normal in size with greater than 50% respiratory variability, suggesting right atrial pressure of 3 mmHg. Comparison(s):  Changes from prior study are noted. 09/13/2021: LVEF 60-65%. FINDINGS  Left Ventricle: Left ventricular ejection fraction, by estimation, is 65 to 70%. The left  ventricle has normal function. The left ventricle has no regional wall motion abnormalities. Definity contrast agent was given IV to delineate the left ventricular  endocardial borders. The left ventricular internal cavity size was normal in size. There is no left ventricular hypertrophy. Left ventricular diastolic parameters are consistent with Grade I diastolic dysfunction (impaired relaxation). Indeterminate filling pressures. Right Ventricle: The right ventricular size is normal. No increase in right ventricular wall thickness. Right ventricular systolic function is low normal. The tricuspid regurgitant velocity is 2.04 m/s, and with an assumed right atrial pressure of 3 mmHg, the estimated right ventricular systolic pressure is [id number] mmHg. Left Atrium: Left atrial size was normal in size. Right Atrium: Right atrial size was normal in size. Pericardium: There is no evidence of pericardial effusion. Mitral Valve: The mitral valve is grossly normal. Trivial mitral valve regurgitation. Tricuspid Valve: The tricuspid valve is grossly normal. Tricuspid valve regurgitation is trivial. Aortic Valve: The aortic valve is tricuspid. Aortic valve regurgitation is trivial. Aortic valve sclerosis is present, with no evidence of aortic valve stenosis. Aortic valve mean gradient measures 1.0 mmHg. Aortic valve peak gradient measures 2.6 mmHg. Aortic valve area, by VTI measures 2.83 cm. Pulmonic Valve: The pulmonic valve was normal in structure. Pulmonic valve regurgitation is not visualized. Aorta: The aortic root and ascending aorta are structurally normal, with no evidence of dilitation. Venous: The inferior vena cava is normal in size with greater than 50% respiratory variability, suggesting right atrial pressure of 3 mmHg. IAS/Shunts: No atrial level shunt detected by color flow Doppler.  LEFT VENTRICLE PLAX 2D LVIDd:         4.20 cm   Diastology LVIDs:         2.60 cm   LV e' medial:    5.87 cm/s LV PW:         1.10 cm    LV E/e' medial:  10.4 LV IVS:        1.00 cm   LV e' lateral:   8.49 cm/s LVOT diam:     2.20 cm   LV E/e' lateral: 7.2 LV SV:         45 LV SV Index:   25 LVOT Area:     3.80 cm  RIGHT VENTRICLE             IVC RV Basal diam:  2.60 cm     IVC diam: 1.60 cm RV S prime:     10.20 cm/s TAPSE (M-mode): 2.0 cm LEFT ATRIUM           Index        RIGHT ATRIUM           Index LA diam:      3.00 cm 1.64 cm/m   RA Area:     11.40 cm LA Vol (A2C): 29.7 ml 16.26 ml/m  RA Volume:   24.40 ml  13.36 ml/m LA Vol (A4C): 36.7 ml 20.09 ml/m  AORTIC VALVE AV Area (Vmax):    3.37 cm AV Area (Vmean):   3.08 cm AV Area (VTI):     2.83 cm AV Vmax:           80.40 cm/s AV Vmean:          52.500 cm/s AV VTI:            0.160  m AV Peak Grad:      2.6 mmHg AV Mean Grad:      1.0 mmHg LVOT Vmax:         71.30 cm/s LVOT Vmean:        42.600 cm/s LVOT VTI:          0.119 m LVOT/AV VTI ratio: 0.74  AORTA Ao Root diam: 3.20 cm Ao Asc diam:  2.80 cm MITRAL VALVE               TRICUSPID VALVE MV Area (PHT): 3.66 cm    TR Peak grad:   16.6 mmHg MV Decel Time: 207 msec    TR Vmax:        204.00 cm/s MV E velocity: 61.30 cm/s MV A velocity: 63.20 cm/s  SHUNTS MV E/A ratio:  0.97        Systemic VTI:  0.12 m                            Systemic Diam: 2.20 cm Lyman Bishop MD Electronically signed by Lyman Bishop MD Signature Date/Time: 01/26/2023/4:02:20 PM    Final    CT Angio Chest Pulmonary Embolism (PE) W or WO Contrast  Result Date: 01/25/2023 CLINICAL DATA:  Pulmonary embolism (PE) suspected, high prob EXAM: CT ANGIOGRAPHY CHEST WITH CONTRAST TECHNIQUE: Multidetector CT imaging of the chest was performed using the standard protocol during bolus administration of intravenous contrast. Multiplanar CT image reconstructions and MIPs were obtained to evaluate the vascular anatomy. RADIATION DOSE REDUCTION: This exam was performed according to the departmental dose-optimization program which includes automated exposure control, adjustment of  the mA and/or kV according to patient size and/or use of iterative reconstruction technique. CONTRAST:  58m OMNIPAQUE IOHEXOL 350 MG/ML SOLN COMPARISON:  07/17/2021 FINDINGS: Cardiovascular: No filling defects in the pulmonary arteries to suggest pulmonary emboli. Heart is normal size. Aorta is normal caliber. Scattered coronary artery and aortic calcifications. Mediastinum/Nodes: No mediastinal, hilar, or axillary adenopathy. Trachea and esophagus are unremarkable. Thyroid unremarkable. Lungs/Pleura: Centrilobular emphysema. No confluent opacities or effusions. Upper Abdomen: No acute findings Musculoskeletal: Chest wall soft tissues are unremarkable. No acute bony abnormality. Review of the MIP images confirms the above findings. IMPRESSION: No evidence of pulmonary embolus. Coronary artery disease. Aortic Atherosclerosis (ICD10-I70.0) and Emphysema (ICD10-J43.9). Electronically Signed   By: KRolm BaptiseM.D.   On: 01/25/2023 20:33   DG Chest Portable 1 View  Result Date: 01/25/2023 CLINICAL DATA:  Shortness of breath and chest pain. EXAM: PORTABLE CHEST 1 VIEW COMPARISON:  January 16, 2023 FINDINGS: Cardiomediastinal silhouette is normal. Mediastinal contours appear intact. There is no evidence of focal airspace consolidation, pleural effusion or pneumothorax. Osseous structures are without acute abnormality. Soft tissues are grossly normal. IMPRESSION: No active disease. Electronically Signed   By: DFidela SalisburyM.D.   On: 01/25/2023 15:44    Microbiology: Results for orders placed or performed during the hospital encounter of 02/15/23  Resp panel by RT-PCR (RSV, Flu A&B, Covid) Anterior Nasal Swab     Status: None   Collection Time: 02/15/23  2:04 PM   Specimen: Anterior Nasal Swab  Result Value Ref Range Status   SARS Coronavirus 2 by RT PCR NEGATIVE NEGATIVE Final    Comment: (NOTE) SARS-CoV-2 target nucleic acids are NOT DETECTED.  The SARS-CoV-2 RNA is generally detectable in upper  respiratory specimens during the acute phase of infection. The lowest concentration of SARS-CoV-2 viral copies this assay  can detect is 138 copies/mL. A negative result does not preclude SARS-Cov-2 infection and should not be used as the sole basis for treatment or other patient management decisions. A negative result may occur with  improper specimen collection/handling, submission of specimen other than nasopharyngeal swab, presence of viral mutation(s) within the areas targeted by this assay, and inadequate number of viral copies(<138 copies/mL). A negative result must be combined with clinical observations, patient history, and epidemiological information. The expected result is Negative.  Fact Sheet for Patients:  EntrepreneurPulse.com.au  Fact Sheet for Healthcare Providers:  IncredibleEmployment.be  This test is no t yet approved or cleared by the [place] FDA and  has been authorized for detection and/or diagnosis of SARS-CoV-2 by FDA under an Emergency Use Authorization (EUA). This EUA will remain  in effect (meaning this test can be used) for the duration of the COVID-19 declaration under Section 564(b)(1) of the Act, 21 U.S.C.section 360bbb-3(b)(1), unless the authorization is terminated  or revoked sooner.       Influenza A by PCR NEGATIVE NEGATIVE Final   Influenza B by PCR NEGATIVE NEGATIVE Final    Comment: (NOTE) The Xpert Xpress SARS-CoV-2/FLU/RSV plus assay is intended as an aid in the diagnosis of influenza from Nasopharyngeal swab specimens and should not be used as a sole basis for treatment. Nasal washings and aspirates are unacceptable for Xpert Xpress SARS-CoV-2/FLU/RSV testing.  Fact Sheet for Patients: EntrepreneurPulse.com.au  Fact Sheet for Healthcare Providers: IncredibleEmployment.be  This test is not yet approved or cleared by the [place] FDA and has been  authorized for detection and/or diagnosis of SARS-CoV-2 by FDA under an Emergency Use Authorization (EUA). This EUA will remain in effect (meaning this test can be used) for the duration of the COVID-19 declaration under Section 564(b)(1) of the Act, 21 U.S.C. section 360bbb-3(b)(1), unless the authorization is terminated or revoked.     Resp Syncytial Virus by PCR NEGATIVE NEGATIVE Final    Comment: (NOTE) Fact Sheet for Patients: EntrepreneurPulse.com.au  Fact Sheet for Healthcare Providers: IncredibleEmployment.be  This test is not yet approved or cleared by the [place] FDA and has been authorized for detection and/or diagnosis of SARS-CoV-2 by FDA under an Emergency Use Authorization (EUA). This EUA will remain in effect (meaning this test can be used) for the duration of the COVID-19 declaration under Section 564(b)(1) of the Act, 21 U.S.C. section 360bbb-3(b)(1), unless the authorization is terminated or revoked.  Performed at [facility], [address] [place], [place] [address]     Labs: CBC: Recent Labs  Lab 02/15/23 1404 02/16/23 0204 02/17/23 0201 02/18/23 0217 02/19/23 0213  WBC 17.8* 12.4* 14.6* 12.3* 11.1*  NEUTROABS 16.0*  --  13.1*  --  8.5*  HGB 11.5* 10.9* 10.9* 10.6* 10.7*  HCT 35.9* 33.0* 32.8* 32.5* 33.6*  MCV 81.0 79.5* 80.4 81.3 82.0  PLT 265 223 249 235 [id number]   Basic Metabolic Panel: Recent Labs  Lab 02/17/23 0201 02/18/23 0217 02/19/23 0213 02/20/23 0202 02/21/23 0428  NA 133* 136 135 137 135  K 3.4* 4.4 3.9 5.1 4.3  CL 89* 92* 91* 91* 89*  CO2 35* 33* 34* 35* 35*  GLUCOSE 154* 127* 102* 112* 109*  BUN '20 22 21 16 23  '$ CREATININE 0.85 0.89 0.90 1.03 1.12  CALCIUM 7.9* 8.2* 8.8* 9.5 9.3  MG 2.3 2.3 2.2  --   --   PHOS 2.7 2.5 2.9  --   --    Liver Function  Tests: Recent Labs  Lab 02/15/23 1404 02/17/23 0201 02/19/23 0213  AST '30 26 20  '$ ALT 38 33 32  ALKPHOS 70 70 60   BILITOT 0.7 0.5 0.5  PROT 6.6 5.7* 5.8*  ALBUMIN 3.1* 2.6* 2.7*   CBG: No results for input(s): "GLUCAP" in the last 168 hours.  Discharge time spent: less than 30 minutes.  Signed: Annita Brod, MD [facility] 02/21/2023

## 2023-02-21 NOTE — Care Management Important Message (Signed)
Important Message  Patient Details  Name: Paul [name] MRN: [id number] Date of Birth: [birth year]   Medicare Important Message Given:  Yes     Shelda Altes 02/21/2023, 11:18 AM

## 2023-02-23 ENCOUNTER — Encounter (HOSPITAL_BASED_OUTPATIENT_CLINIC_OR_DEPARTMENT_OTHER): Payer: Self-pay | Admitting: Emergency Medicine

## 2023-02-23 ENCOUNTER — Other Ambulatory Visit: Payer: Self-pay

## 2023-02-23 ENCOUNTER — Other Ambulatory Visit: Payer: Self-pay | Admitting: Family Medicine

## 2023-02-23 ENCOUNTER — Emergency Department (HOSPITAL_BASED_OUTPATIENT_CLINIC_OR_DEPARTMENT_OTHER)
Admission: EM | Admit: 2023-02-23 | Discharge: 2023-02-23 | Disposition: A | Discharge status: Home / Self Care | Payer: Medicare Other | Attending: Emergency Medicine | Admitting: Emergency Medicine

## 2023-02-23 ENCOUNTER — Emergency Department (HOSPITAL_BASED_OUTPATIENT_CLINIC_OR_DEPARTMENT_OTHER): Payer: Medicare Other

## 2023-02-23 DIAGNOSIS — Z7982 Long term (current) use of aspirin: Secondary | ICD-10-CM | POA: Diagnosis not present

## 2023-02-23 DIAGNOSIS — Z1152 Encounter for screening for COVID-19: Secondary | ICD-10-CM | POA: Diagnosis not present

## 2023-02-23 DIAGNOSIS — J441 Chronic obstructive pulmonary disease with (acute) exacerbation: Secondary | ICD-10-CM

## 2023-02-23 DIAGNOSIS — I5032 Chronic diastolic (congestive) heart failure: Secondary | ICD-10-CM | POA: Diagnosis not present

## 2023-02-23 DIAGNOSIS — Z79899 Other long term (current) drug therapy: Secondary | ICD-10-CM | POA: Insufficient documentation

## 2023-02-23 DIAGNOSIS — I110 Hypertensive heart disease with heart failure: Secondary | ICD-10-CM | POA: Insufficient documentation

## 2023-02-23 DIAGNOSIS — R0602 Shortness of breath: Secondary | ICD-10-CM | POA: Diagnosis not present

## 2023-02-23 LAB — CBC
HCT: 36.6 % — ABNORMAL LOW (ref 39.0–52.0)
Hemoglobin: 12.1 g/dL — ABNORMAL LOW (ref 13.0–17.0)
MCH: 26.2 pg (ref 26.0–34.0)
MCHC: 33.1 g/dL (ref 30.0–36.0)
MCV: 79.2 fL — ABNORMAL LOW (ref 80.0–100.0)
Platelets: 338 10*3/uL (ref 150–400)
RBC: 4.62 MIL/uL (ref 4.22–5.81)
RDW: 15 % (ref 11.5–15.5)
WBC: 19.3 10*3/uL — ABNORMAL HIGH (ref 4.0–10.5)
nRBC: 0 % (ref 0.0–0.2)

## 2023-02-23 LAB — I-STAT VENOUS BLOOD GAS, ED
Acid-Base Excess: 14 mmol/L — ABNORMAL HIGH (ref 0.0–2.0)
Bicarbonate: 40.7 mmol/L — ABNORMAL HIGH (ref 20.0–28.0)
Calcium, Ion: 1.04 mmol/L — ABNORMAL LOW (ref 1.15–1.40)
HCT: 38 % — ABNORMAL LOW (ref 39.0–52.0)
Hemoglobin: 12.9 g/dL — ABNORMAL LOW (ref 13.0–17.0)
O2 Saturation: 59 %
Patient temperature: 98.2
Potassium: 4.7 mmol/L (ref 3.5–5.1)
Sodium: 130 mmol/L — ABNORMAL LOW (ref 135–145)
TCO2: 42 mmol/L — ABNORMAL HIGH (ref 22–32)
pCO2, Ven: 58.8 mmHg (ref 44–60)
pH, Ven: 7.447 — ABNORMAL HIGH (ref 7.25–7.43)
pO2, Ven: 30 mmHg — CL (ref 32–45)

## 2023-02-23 LAB — RESP PANEL BY RT-PCR (RSV, FLU A&B, COVID)  RVPGX2
Influenza A by PCR: NEGATIVE
Influenza B by PCR: NEGATIVE
Resp Syncytial Virus by PCR: NEGATIVE
SARS Coronavirus 2 by RT PCR: NEGATIVE

## 2023-02-23 LAB — BASIC METABOLIC PANEL
Anion gap: 10 (ref 5–15)
BUN: 29 mg/dL — ABNORMAL HIGH (ref 8–23)
CO2: 34 mmol/L — ABNORMAL HIGH (ref 22–32)
Calcium: 8.2 mg/dL — ABNORMAL LOW (ref 8.9–10.3)
Chloride: 87 mmol/L — ABNORMAL LOW (ref 98–111)
Creatinine, Ser: 0.91 mg/dL (ref 0.61–1.24)
GFR, Estimated: >60 mL/min (ref >60)
Glucose, Bld: 210 mg/dL — ABNORMAL HIGH (ref 70–99)
Potassium: 3.7 mmol/L (ref 3.5–5.1)
Sodium: 131 mmol/L — ABNORMAL LOW (ref 135–145)

## 2023-02-23 LAB — TROPONIN I (HIGH SENSITIVITY): Troponin I (High Sensitivity): 12 ng/L (ref <18)

## 2023-02-23 LAB — BRAIN NATRIURETIC PEPTIDE: B Natriuretic Peptide: 145.6 pg/mL — ABNORMAL HIGH (ref 0.0–100.0)

## 2023-02-23 LAB — LACTIC ACID, PLASMA: Lactic Acid, Venous: 1.9 mmol/L (ref 0.5–1.9)

## 2023-02-23 LAB — PROCALCITONIN: Procalcitonin: 0.1 ng/mL

## 2023-02-23 MED ORDER — FUROSEMIDE 10 MG/ML IJ SOLN
40.0000 mg | Freq: Once | INTRAMUSCULAR | Status: AC
  Administered 2023-02-23: 40 mg via INTRAVENOUS
  Filled 2023-02-23: qty 4

## 2023-02-23 MED ORDER — DOXYCYCLINE HYCLATE 100 MG PO CAPS: 100.0000 mg | ORAL_CAPSULE | Freq: Two times a day (BID) | ORAL | 0 refills | Status: AC

## 2023-02-23 MED ORDER — DOXYCYCLINE HYCLATE 100 MG PO TABS
100.0000 mg | ORAL_TABLET | Freq: Once | ORAL | Status: AC
  Administered 2023-02-23: 100 mg via ORAL
  Filled 2023-02-23: qty 1

## 2023-02-23 MED ORDER — METHYLPREDNISOLONE SODIUM SUCC 125 MG IJ SOLR
125.0000 mg | Freq: Once | INTRAMUSCULAR | Status: AC
  Administered 2023-02-23: 125 mg via INTRAVENOUS
  Filled 2023-02-23: qty 2

## 2023-02-23 MED ORDER — IPRATROPIUM-ALBUTEROL 0.5-2.5 (3) MG/3ML IN SOLN
3.0000 mL | Freq: Once | RESPIRATORY_TRACT | Status: AC
  Administered 2023-02-23: 3 mL via RESPIRATORY_TRACT
  Filled 2023-02-23: qty 3

## 2023-02-23 NOTE — ED Provider Notes (Signed)
[facility] Provider Note   CSN: [id number] Arrival date & time: 02/23/23  1340     History {Add pertinent medical, surgical, social history, OB history to HPI:1} Chief Complaint  Patient presents with   Chest Pain   Shortness of Breath    Paul [name] is a 77 y.o. male.  HPI     Home Medications Prior to Admission medications   Medication Sig Start Date End Date Taking? Authorizing Provider  acetaminophen (TYLENOL) 500 MG tablet Take 1,000 mg by mouth at bedtime as needed (pain).    [provider]  albuterol (PROAIR HFA) 108 (90 Base) MCG/ACT inhaler Inhale 2 puffs into the lungs as needed for wheezing or shortness of breath.    [provider]  albuterol (PROVENTIL) (2.5 MG/3ML) 0.083% nebulizer solution Take 3 mLs (2.5 mg total) by nebulization every 6 (six) hours as needed for shortness of breath or wheezing. 10/23/22   Parrett, Fonnie Mu, NP  ALPRAZolam (XANAX) 0.5 MG tablet Take 1 tablet (0.5 mg total) by mouth 2 (two) times daily as needed for anxiety. 07/25/22   Cox, Elnita Maxwell, MD  amLODipine (NORVASC) 2.5 MG tablet Take 1 tablet (2.5 mg total) by mouth daily. 02/21/23 02/16/24  Annita Brod, MD  Ascorbic Acid (VITAMIN C) 1000 MG tablet Take 1,000 mg by mouth daily.    [provider]  aspirin EC 81 MG tablet Take 1 tablet (81 mg total) by mouth daily. Swallow whole. 01/28/22   Tommie Raymond, NP  atorvastatin (LIPITOR) 40 MG tablet TAKE 1 TABLET BY MOUTH EVERYDAY AT BEDTIME Patient taking differently: Take 40 mg by mouth at bedtime. 11/26/22   Marge Duncans, PA-C  Budeson-Glycopyrrol-Formoterol (BREZTRI AEROSPHERE) 160-9-4.8 MCG/ACT AERO Inhale 2 puffs into the lungs 2 (two) times daily. 01/02/23   Cox, Elnita Maxwell, MD  calcium carbonate (OS-CAL) 600 MG TABS tablet Take 600 mg by mouth daily.    [provider]  carvedilol (COREG) 3.125 MG tablet Take 1 tablet (3.125 mg total) by mouth 2 (two) times  daily with a meal. 02/11/23 02/06/24  Trudi Ida, NP  Cholecalciferol (VITAMIN D3) 50 MCG (2000 UT) TABS Take 2,000 Units by mouth in the morning.    [provider]  denosumab (PROLIA) 60 MG/ML SOSY injection Inject 60 mg into the skin every 6 (six) months.    [provider]  digoxin (LANOXIN) 0.125 MG tablet Take 1 tablet (0.125 mg total) by mouth daily. 02/08/23 05/09/23  [place] Donnamarie Poag, DO  ferrous sulfate 325 (65 FE) MG tablet Take 325 mg by mouth 2 (two) times daily with a meal.    [provider]  fluticasone (FLONASE) 50 MCG/ACT nasal spray Place 2 sprays into both nostrils daily as needed for allergies or rhinitis.    [provider]  furosemide (LASIX) 40 MG tablet Take 1 tablet (40 mg total) by mouth 2 (two) times daily. 02/07/23 05/08/23  [place] Donnamarie Poag, DO  furosemide (LASIX) 40 MG tablet Take 1 tablet (40 mg total) by mouth daily as needed (Take additional dose of Lasix if weight greater than 2 pounds from previous day or greater than 3 pounds since previous 2 days). 02/21/23 02/21/24  Annita Brod, MD  loratadine (CLARITIN) 10 MG tablet Take 10 mg by mouth in the morning.    [provider]  Melatonin 12 MG TABS Take 12 mg by mouth at bedtime.    [provider]  Multiple Vitamin (  MULTIVITAMIN WITH MINERALS) TABS tablet Take 1 tablet by mouth daily with breakfast.    [provider]  OXYGEN Inhale 2 L/min into the lungs at bedtime.    [provider]  pantoprazole (PROTONIX) 40 MG tablet Take 1 tablet (40 mg total) by mouth 2 (two) times daily. 11/06/22   Tanda Rockers, MD  predniSONE (DELTASONE) 10 MG tablet Take 2 tablets (20 mg total) by mouth daily with breakfast for 3 days, THEN 1 tablet (10 mg total) daily with breakfast for 3 days. 02/22/23 02/28/23  Annita Brod, MD  Probiotic Product (PROBIOTIC DAILY) CAPS Take 1 capsule by mouth in the morning.    [provider]  sertraline (ZOLOFT) 50  MG tablet Take 1 tablet (50 mg total) by mouth at bedtime. 09/16/22   CoxElnita Maxwell, MD      Allergies    Tape, Daliresp [roflumilast], Levaquin [levofloxacin], and Alendronate    Review of Systems   Review of Systems  Physical Exam Updated Vital Signs BP (!) 112/90 (BP Location: Left Arm)   Pulse 95   Temp 98.2 F (36.8 C) (Oral)   Resp (!) 24   SpO2 95%  Physical Exam  ED Results / Procedures / Treatments   Labs (all labs ordered are listed, but only abnormal results are displayed) Labs Reviewed  BASIC METABOLIC PANEL  CBC  TROPONIN I (HIGH SENSITIVITY)    EKG EKG Interpretation  Date/Time:  Sunday February 23 2023 13:50:22 EST Ventricular Rate:  95 PR Interval:  123 QRS Duration: 91 QT Interval:  320 QTC Calculation: 403 R Axis:   67 Text Interpretation: Sinus rhythm Borderline repol abnormality, diffuse leads agree, diffuse ST depression similar to previous tracing Confirmed by Charlesetta Shanks [phone]) on 02/23/2023 2:38:17 PM  Radiology DG Chest 2 View  Result Date: 02/23/2023 CLINICAL DATA:  Shortness of breath EXAM: CHEST - 2 VIEW COMPARISON:  02/15/2023 x-ray. Older exams as well including CT angiogram of 02/04/2023 FINDINGS: Hyperinflation. Normal cardiopericardial silhouette with calcified aorta. No consolidation, pneumothorax or effusion. No edema. Degenerative changes of the spine with multilevel compression deformities as described on prior CT. IMPRESSION: Hyperinflation.  Chronic changes. Electronically Signed   By: Jill Side M.D.   On: 02/23/2023 14:16    Procedures Procedures  {Document cardiac monitor, telemetry assessment procedure when appropriate:1}  Medications Ordered in ED Medications - No data to display  ED Course/ Medical Decision Making/ A&P   {   Click here for ABCD2, HEART and other calculatorsREFRESH Note before signing :1}                          Medical Decision Making Amount and/or Complexity of Data Reviewed Labs:  ordered. Radiology: ordered.   ***  {Document critical care time when appropriate:1} {Document review of labs and clinical decision tools ie heart score, Chads2Vasc2 etc:1}  {Document your independent review of radiology images, and any outside records:1} {Document your discussion with family members, caretakers, and with consultants:1} {Document social determinants of health affecting pt's care:1} {Document your decision making why or why not admission, treatments were needed:1} Final Clinical Impression(s) / ED Diagnoses Final diagnoses:  None    Rx / DC Orders ED Discharge Orders     None

## 2023-02-23 NOTE — ED Provider Notes (Signed)
Patient handed off to me awaiting reevaluation.  Suspicion of mild COPD exacerbation/mild volume overload.  Lab work overall is reassuring.  Will prescribe doxycycline and have him slightly increase his Lasix for the next few days.  He is on his room air oxygenation.  He has normal work of breathing.  No fever.  He is on steroids which likely accounts for his leukocytosis.  There is no obvious pneumonia on chest x-ray.  BNP is mildly elevated.  Troponin at baseline.  No concern for cardiac process.  Overall he appears very comfortable.  Discharged in good condition.  This chart was dictated using voice recognition software.  Despite best efforts to proofread,  errors can occur which can change the documentation meaning.    Lennice Sites, DO 02/23/23 1721

## 2023-02-23 NOTE — Discharge Instructions (Signed)
Take next dose antibiotic tomorrow morning.  I would take 60 mg of Lasix in the morning and 40 mg in the evening for the next 3 days and then go back to your normal dose.

## 2023-02-23 NOTE — ED Triage Notes (Signed)
Pt discharged from hospital Fri for COPD exacerbation (on Bi-Pap); returns for CP, SHOB today; on 2-3L [place] at home

## 2023-02-24 ENCOUNTER — Other Ambulatory Visit: Payer: Self-pay | Admitting: *Deleted

## 2023-02-24 ENCOUNTER — Telehealth: Payer: Self-pay | Admitting: *Deleted

## 2023-02-24 DIAGNOSIS — I5031 Acute diastolic (congestive) heart failure: Secondary | ICD-10-CM

## 2023-02-24 NOTE — Progress Notes (Signed)
  Care Coordination   Note   02/24/2023 Name: DILYNN ROMMEL MRN: [id number] DOB: [birth year]  CHAP BALDIVIA is a 77 y.o. year old male who sees Cox, Kirsten, MD for primary care. I reached out to [name] by phone today to offer care coordination services.  Mr. Sare was given information about Care Coordination services today including:   The Care Coordination services include support from the care team which includes your Nurse Coordinator, Clinical Social Worker, or Pharmacist.  The Care Coordination team is here to help remove barriers to the health concerns and goals most important to you. Care Coordination services are voluntary, and the patient may decline or stop services at any time by request to their care team member.   Care Coordination Consent Status: Patient agreed to services and verbal consent obtained.   Follow up plan:  Telephone appointment with care coordination team member scheduled for:  02/26/2023  Encounter Outcome:  Pt. Scheduled from referral   Julian Hy, [place] Direct Dial: [phone]

## 2023-02-24 NOTE — Patient Outreach (Signed)
Paul [name] DOB:[birth year] MRN# [id number]  Follow up Note: Pt discharged however returned over the weekend twice for SOB and chest pain. Pt treatment and orders initiated for readmission prevention via [facility] care management services.  Raina Mina, RN, BSN [place] Office Hours M-F 8:00 am to 4:30 pm [phone] [Office toll free line][facility] Office Hours are M-F 8:30 - 5 pm 24 hour nurse advise line [phone] Conceirge  [name]

## 2023-02-25 ENCOUNTER — Telehealth: Payer: Self-pay | Admitting: Adult Health

## 2023-02-25 NOTE — Telephone Encounter (Signed)
Called and spoke w/ pts wife. I explained that we do need to see Paul [name] in office as a HFU due to the increase of hospital visits he has had regarding his COPD. I have him scheduled 3/13 w/ TP and they wanting to also discuss getting a BiPAP

## 2023-02-25 NOTE — Telephone Encounter (Signed)
Ms. Paul [name] tried to get him a Bipap  in the past but it was dcln. He has been in the hospital 4 x's this year. Several people at the hospital told him he would likely need a BiPAP. He now has congestive heart failure as well as the COPD. Wife's # is [phone]  Wife would like Tammy to try again.

## 2023-02-26 ENCOUNTER — Ambulatory Visit: Payer: Self-pay

## 2023-02-26 ENCOUNTER — Telehealth: Payer: Self-pay

## 2023-02-26 NOTE — Patient Outreach (Signed)
Care Coordination   Initial Visit Note   02/26/2023 Name: Paul [name] MRN: [id number] DOB: [birth year]  Paul [name] is a 77 y.o. year old male who sees Cox, Kirsten, MD for primary care. I spoke with  Paul [name] by phone today.  What matters to the patients health and wellness today?  Placed call to patient today after 4 recent admissions and 1 ED visit.  Reviewed with patient his thoughts about his recent health. He reports that he gets short of breath really fast and  ends up in crisis.   Reports that he has very little warning or symptoms before he "can't breathe".  Patient is no longer active with [facility] due to nurses no longer doing home visits.  Patient does not believe his needs wound be met over the phone. Reviewed with patient that the current plan is not working well. Reviewed concerns for having to go to the hospital so much and concern for worsening condition if exposed to other germs.   Patient reports that he understands now how important that it is to weigh daily and decrease his salt intake.  Current weight of 150 pounds. Patient reports that he is taking his medications as prescribed.  Patient has follow up planned with pulmonary to inquire about BIPAP. Patient feels like bipap would help him.    Goals Addressed               This Visit's Progress     Improve my breathinhg (pt-stated)        Interventions Today    Flowsheet Row Most Recent Value  Chronic Disease   Chronic disease during today's visit Chronic Obstructive Pulmonary Disease (COPD), Congestive Heart Failure (CHF), Atrial Fibrillation (AFib)  General Interventions   General Interventions Discussed/Reviewed General Interventions Discussed, Level of Care, Doctor Visits  Doctor Visits Discussed/Reviewed Doctor Visits Discussed, PCP, Specialist  PCP/Specialist Visits Compliance with follow-up visit  Education Interventions   Education Provided Provided Education  Provided Verbal  Education On Nutrition, Medication, When to see the doctor, Sick Day Rules  Nutrition Interventions   Nutrition Discussed/Reviewed Nutrition Discussed, Decreasing salt  Pharmacy Interventions   Pharmacy Dicussed/Reviewed Medications and their functions  Safety Interventions   Safety Discussed/Reviewed Safety Discussed      Reviewed with patient concern about all his recent hospitalizations. Contacted [facility] and spoke with [name] his nurse and she reports patient declined interest in telephone management.  Inquired if an exception could be made due to multiple recent  admissions.  Per [name] patient could be managed by telephone by her ( his previous nurse for 3 years) and she would be able to pick up on patient concerns and new symptoms and help trouble shoot.  Reviewed with patient if he is interested in hospice and he declined.   Reviewed again the option of telephone management with [facility] and patient is now willing to try it.  Follow up appointment is planned with pulmonary next week. Wife was going to call [facility] to get case reopened.  I provided my contact information and encouraged patient to call me if his condition changes. He agreed.         SDOH assessments and interventions completed:  No     Care Coordination Interventions:  Yes, provided   Follow up plan: Follow up call scheduled for 03/03/2023    Encounter Outcome:  Pt. Visit Completed   Tomasa Rand, RN, BSN, CEN [place] Coordinator [phone]

## 2023-02-26 NOTE — Progress Notes (Unsigned)
02/26/2023- Verification in process for next Prolia injection which is due on 03/13/2023.  02/28/2023- Calling [employer] to check benefit verification, still showing in process with no updates. Spoke with representative, she was able to expedite benefit verification and we should know more by today or Monday.  03/05/2023- Benefit verification received, Physical Referral available, no authorization required, $240.00 met of $240.00 required, 0% OOP and 0% Admin fee. For the primary MD purchess access option, benefits subject to a '@240'$ .00 deductible (met) and a 20% co-insurance for the administration and cost of Prolia. Patient has Plan F supplement. Sent task to Rhae Hammock, LPN to order and schedule patient for Prolia, shot, next shot due 03/13/2023.    Paul [name], [place] Pharmacist Assistant [phone]

## 2023-02-27 ENCOUNTER — Telehealth: Payer: Self-pay

## 2023-02-27 NOTE — Patient Outreach (Signed)
  Care Coordination   Multidisciplinary Case Review Note    02/27/2023 Name: Paul [name] MRN: [id number] DOB: 1945-12-24  Paul [name] is a 77 y.o. year old male who sees Cox, Kirsten, MD for primary care.  The  multidisciplinary care team met today to review patient care needs and barriers.      SDOH assessments and interventions completed:  No     Care Coordination Interventions Activated:  Yes   Care Coordination Interventions:  Yes, provided   Follow up plan: Follow up call scheduled for as previously planned.    Multidisciplinary Team Attendees:   Tomasa Rand RN Richarda Osmond Tousey RN Eduard Clos LCSW  Scribe for Multidisciplinary Case Review:  Tomasa Rand RN   Tomasa Rand, RN, BSN, CEN [place] Coordinator [phone]

## 2023-03-03 ENCOUNTER — Ambulatory Visit: Payer: Self-pay

## 2023-03-03 NOTE — Patient Outreach (Signed)
  Care Coordination   Follow Up Visit Note   03/03/2023 Name: Paul [name] MRN: [id number] DOB: 08/23/1946  Paul [name] is a 77 y.o. year old male who sees Cox, Kirsten, MD for primary care. I spoke with  Paul [name] by phone today.  What matters to the patients health and wellness today?  Follow up call with patient today. Patient reports that his wife is in the hospital.  Reports that his breathing is doing well. Reports that he is having swelling in his legs. States weight is up 3.5 pounds in the last 5 days.  Reports that he is taking his medications as prescribed.  Reports he is following his low salt diet.  Spoke with Paul [name] and patient will be readmitted to telephonic services on 03/13/2023.     Last Weight  Most recent update: 03/03/2023  1:54 PM    Weight  69.6 kg (153 lb 8 oz)              Goals Addressed               This Visit's Progress     Improve my breathinhg (pt-stated)        Interventions Today    Flowsheet Row Most Recent Value  Chronic Disease   Chronic disease during today's visit Chronic Obstructive Pulmonary Disease (COPD), Congestive Heart Failure (CHF), Atrial Fibrillation (AFib)  General Interventions   General Interventions Discussed/Reviewed General Interventions Reviewed, Durable Medical Equipment (DME), Communication with  [Spoke with [facility]/ Previously [facility] and patient has agreed to trial telephonic management.]  Durable Medical Equipment (DME) Other  [scales. Reviewed importance of daily weights.]  Communication with RN  Education Interventions   Education Provided Provided Education  Nutrition Interventions   Nutrition Discussed/Reviewed Decreasing salt  Pharmacy Interventions   Pharmacy Dicussed/Reviewed Medications and their functions, Medication Adherence      Reviewed with patient 3.5 pound weight gain in 5 days. Reviewed the heart failure zones and when to call MD.  Encouraged  patient to continue to take his medications as prescribed.  Encouraged patient to call MD for weight gain of 3 pounds over night or 5 pounds in a week. Confirmed patient has an appointment with pulmonary on 03/05/2023 I will follow up on 03/06/2023 to check in about weight.         SDOH assessments and interventions completed:  No     Care Coordination Interventions:  Yes, provided   Follow up plan: Follow up call scheduled for 03/06/2023    Encounter Outcome:  Pt. Visit Completed   Tomasa Rand, RN, BSN, CEN [place] Coordinator [phone]

## 2023-03-05 ENCOUNTER — Telehealth: Payer: Self-pay

## 2023-03-05 ENCOUNTER — Encounter: Payer: Self-pay | Admitting: Adult Health

## 2023-03-05 ENCOUNTER — Ambulatory Visit (INDEPENDENT_AMBULATORY_CARE_PROVIDER_SITE_OTHER): Payer: Medicare Other | Admitting: Adult Health

## 2023-03-05 VITALS — BP 120/40 | HR 84 | Temp 98.2°F | Ht 66.0 in | Wt 156.4 lb

## 2023-03-05 DIAGNOSIS — R0683 Snoring: Secondary | ICD-10-CM

## 2023-03-05 DIAGNOSIS — I482 Chronic atrial fibrillation, unspecified: Secondary | ICD-10-CM | POA: Diagnosis not present

## 2023-03-05 DIAGNOSIS — J441 Chronic obstructive pulmonary disease with (acute) exacerbation: Secondary | ICD-10-CM

## 2023-03-05 DIAGNOSIS — J449 Chronic obstructive pulmonary disease, unspecified: Secondary | ICD-10-CM

## 2023-03-05 DIAGNOSIS — I5032 Chronic diastolic (congestive) heart failure: Secondary | ICD-10-CM | POA: Diagnosis not present

## 2023-03-05 DIAGNOSIS — J9611 Chronic respiratory failure with hypoxia: Secondary | ICD-10-CM

## 2023-03-05 NOTE — Progress Notes (Signed)
@Patient  ID: Paul [name], male    DOB: 06/24/1946, 77 y.o.   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 Prone to recurrent COPD exacerbations and hospitalizations Medical history significant for A-fib not on anticoagulation therapy due to history of severe bleeding secondary to hematuria, balance issues and frequent falls Has been tried on Daliresp but was intolerant COVID-19 infection in January 2022 required hospitalization Hospitalization February 2023 for rhinovirus with COPD exacerbation and acute on chronic respiratory failure Hospitalized March 2023 and July 2023 for COPD exacerbations  TEST/EVENTS :  05/29/16: FVC 2.08 L (86%) FEV1 0.83 L (31%) FEV1/FVC 0.40 FEF 25-75 0.30 L (14%) no bronchodilator response TLC 6.56 L (107%) RV 167% ERV 160% DLCO corrected 60% (hemoglobin 10.9) 07/15/13: FVC 2.68 L (60%) FEV1 1.63 L (53%) FEV1/FVC 0.61 FEF 25-75 0.76 L (26%) 02/04/12: FVC 2.18 L (57%) FEV1 1.09 L (36%) FEV1/FVC 0.50 FEF 25-75 0.33 L (11%)     CT sinus February 2021 no acute sinus disease noted CT chest February 2021 emphysematous changes, several old thoracic compression fractures   2D echo July 2020 EF 60-65%, 2D echo September 13, 2021 EF 60-65%, Watchman device,, grade 3 moderate layered plaque involving the descending aorta   Palliative care started 2021 (nurse comes every 2 weeks)    March 2023 outpatient ABG without hypercarbia-does not qualify for nocturnal BiPAP or NIV  03/05/2023 Follow up ; COPD , O2 RF , Post hospital follow up  Patient presents for a posthospital follow-up.  Patient has had recurrent hospitalizations over the last 3 months for COPD exacerbation, decompensated congestive heart failure and acute on chronic  respiratory failure, A-fib with RVR.  CT chest February 3 and February 13 negative for PE.  No acute process.  Chronic COPD and emphysematous changes since last discharge patient says he is feeling better.  Has decreased cough and congestion.  He is currently on a prednisone taper. Patient has been recommended to be evaluated for outpatient nocturnal BiPAP or noninvasive support for recurrent COPD exacerbation.  In March 2023.  Patient was set up for an outpatient ABG which did not show baseline hypercarbia therefore did not qualify for BiPAP or noninvasive support.  Patient does have symptoms suspicious for sleep apnea with daytime sleepiness, restless sleep snoring and naps throughout the day if he sits down to watch TV.  No symptoms suspicious for cataplexy or sleep paralysis. Patient remains on Breztri inhaler twice daily.  Uses his flutter valve up to 3 times a day.  Is on oxygen 2 L with activity and at bedtime. Patient does have upcoming follow-up with cardiology.  He says his lower extremity swelling has been much better.  He is currently on Lasix.    Allergies  Allergen Reactions   Tape Other (See Comments)    PLEASE USE COBAN WRAP IN LIEU OF "TAPE," as it "pulls off the skin!!   Daliresp [Roflumilast] Diarrhea and Nausea And Vomiting   Levaquin [Levofloxacin] Palpitations and Other (See Comments)    Made the B/P fluctuate and heart raced   Alendronate Anxiety and Other (See Comments)    Jittery/nervous    Immunization History  Administered Date(s) Administered   COVID-19, mRNA, vaccine(Comirnaty)12 years and older 10/31/2022   Fluad Quad(high Dose 65+) 10/02/2020, 10/09/2021, 09/05/2022, 10/17/2022  Influenza Split 09/22/2013, 09/01/2015   Influenza Whole 09/04/2011, 09/21/2012   Influenza, High Dose Seasonal PF 09/02/2016, 09/10/2017, 08/31/2018, 09/24/2018, 10/21/2019   Influenza-Unspecified 08/23/2014   Moderna Covid-19 Vaccine Bivalent Booster 78yrs & up 10/09/2021   Moderna  SARS-COV2 Booster Vaccination 07/05/2021   Moderna Sars-Covid-2 Vaccination 01/17/2020, 02/14/2020, 10/06/2020   PNEUMOCOCCAL CONJUGATE-20 12/05/2022   Pneumococcal Conjugate-13 01/11/2014, 12/09/2016   Pneumococcal Polysaccharide-23 08/23/2010, 08/29/2011   Respiratory Syncytial Virus Vaccine,Recomb Aduvanted(Arexvy) 09/23/2022   Tdap 02/01/2019   Zoster Recombinat (Shingrix) 04/04/2014    Past Medical History:  Diagnosis Date   Anemia    Aortic atherosclerosis (HCC)    Aspiration pneumonia due to inhalation of vomitus ([place]) 02/03/2022   Atrial fibrillation (HCC)    Back pain    COPD (chronic obstructive pulmonary disease) ([place])    COPD exacerbation ([place]) 12/27/2021   COVID-19 12/24/2020   Diabetes mellitus without complication (HCC)    Dizziness 04/08/2022   Elevated PSA    GAD (generalized anxiety disorder)    GERD (gastroesophageal reflux disease)    High cholesterol    Hypertension    Lingular pneumonia 01/05/2021   See cxr 01/06/20 rx with zpak/ cefipime x 5 days and then developed purulent sputum again 01/12/21 so rx levcaquin 500 mg daily x 7 days then return for cxr before more    Lung nodule < 6cm on CT 05/29/2016   Osteoporosis    Oxygen deficiency    Pneumonia    January 2022   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 Quentin Ore with a 20 mm Watchman FLX device   RSV bronchitis 01/27/2022   Vertebral compression fracture ([place]) 05/29/2019   T8 compression fracture noted on CT scan 05/28/2019 inpatient with chronic steroid dependent COPD - rx calcitonin nasal spray rx per PCP     Tobacco History: Social History   Tobacco Use  Smoking Status Former   Packs/day: 2.00   Years: 52.00   Total pack years: 104.00   Types: Cigarettes   Quit date: 02/03/2009   Years since quitting: 14.0  Smokeless Tobacco Never  Tobacco Comments   Counseled to remain smoke free   Counseling given: Not Answered Tobacco comments: Counseled  to remain smoke free   Outpatient Medications Prior to Visit  Medication Sig Dispense Refill   acetaminophen (TYLENOL) 500 MG tablet Take 1,000 mg by mouth at bedtime as needed (pain).     albuterol (PROAIR HFA) 108 (90 Base) MCG/ACT inhaler Inhale 2 puffs into the lungs as needed for wheezing or shortness of breath.     albuterol (PROVENTIL) (2.5 MG/3ML) 0.083% nebulizer solution Take 3 mLs (2.5 mg total) by nebulization every 6 (six) hours as needed for shortness of breath or wheezing. 360 mL 5   ALPRAZolam (XANAX) 0.5 MG tablet Take 1 tablet (0.5 mg total) by mouth 2 (two) times daily as needed for anxiety. 30 tablet 0   amLODipine (NORVASC) 2.5 MG tablet Take 1 tablet (2.5 mg total) by mouth daily. 90 tablet 3   Ascorbic Acid (VITAMIN C) 1000 MG tablet Take 1,000 mg by mouth daily.     aspirin EC 81 MG tablet Take 1 tablet (81 mg total) by mouth daily. Swallow whole. 90 tablet 3   atorvastatin (LIPITOR) 40 MG tablet TAKE 1 TABLET BY MOUTH EVERYDAY AT BEDTIME 90 tablet 1   Budeson-Glycopyrrol-Formoterol (BREZTRI AEROSPHERE) 160-9-4.8 MCG/ACT AERO Inhale 2 puffs into the lungs 2 (two) times daily. 3 each 3  calcium carbonate (OS-CAL) 600 MG TABS tablet Take 600 mg by mouth daily.     carvedilol (COREG) 3.125 MG tablet Take 1 tablet (3.125 mg total) by mouth 2 (two) times daily with a meal. 180 tablet 3   Cholecalciferol (VITAMIN D3) 50 MCG (2000 UT) TABS Take 2,000 Units by mouth in the morning.     denosumab (PROLIA) 60 MG/ML SOSY injection Inject 60 mg into the skin every 6 (six) months.     digoxin (LANOXIN) 0.125 MG tablet Take 1 tablet (0.125 mg total) by mouth daily. 30 tablet 2   ferrous sulfate 325 (65 FE) MG tablet Take 325 mg by mouth 2 (two) times daily with a meal.     fluticasone (FLONASE) 50 MCG/ACT nasal spray Place 2 sprays into both nostrils daily as needed for allergies or rhinitis.     furosemide (LASIX) 40 MG tablet Take 1 tablet (40 mg total) by mouth 2 (two) times daily.  60 tablet 2   furosemide (LASIX) 40 MG tablet Take 1 tablet (40 mg total) by mouth daily as needed (Take additional dose of Lasix if weight greater than 2 pounds from previous day or greater than 3 pounds since previous 2 days). 30 tablet 11   loratadine (CLARITIN) 10 MG tablet Take 10 mg by mouth in the morning.     Melatonin 12 MG TABS Take 12 mg by mouth at bedtime.     Multiple Vitamin (MULTIVITAMIN WITH MINERALS) TABS tablet Take 1 tablet by mouth daily with breakfast.     OXYGEN Inhale 2 L/min into the lungs at bedtime.     pantoprazole (PROTONIX) 40 MG tablet Take 1 tablet (40 mg total) by mouth 2 (two) times daily. 180 tablet 3   Probiotic Product (PROBIOTIC DAILY) CAPS Take 1 capsule by mouth in the morning.     sertraline (ZOLOFT) 50 MG tablet Take 1 tablet (50 mg total) by mouth at bedtime. 90 tablet 1   No facility-administered medications prior to visit.     Review of Systems:   Constitutional:   No  weight loss, night sweats,  Fevers, chills,  +fatigue, or  lassitude.  HEENT:   No headaches,  Difficulty swallowing,  Tooth/dental problems, or  Sore throat,                No sneezing, itching, ear ache, nasal congestion, post nasal drip,   CV:  No chest pain,  Orthopnea, PND, swelling in lower extremities, anasarca, dizziness, palpitations, syncope.   GI  No heartburn, indigestion, abdominal pain, nausea, vomiting, diarrhea, change in bowel habits, loss of appetite, bloody stools.   Resp:   No chest wall deformity  Skin: no rash or lesions.  GU: no dysuria, change in color of urine, no urgency or frequency.  No flank pain, no hematuria   MS:  No joint pain or swelling.  No decreased range of motion.  No back pain.    Physical Exam    GEN: A/Ox3; pleasant , NAD, chronically ill-appearing, on oxygen   HEENT:  [place]/AT,   NOSE-clear, THROAT-clear, no lesions, no postnasal drip or exudate noted.   NECK:  Supple w/ fair ROM; no JVD; normal carotid impulses w/o bruits;  no thyromegaly or nodules palpated; no lymphadenopathy.    RESP  Clear  P & A; w/o, wheezes/ rales/ or rhonchi. no accessory muscle use, no dullness to percussion  CARD:  RRR, no m/r/g, no peripheral edema, pulses intact, no cyanosis or clubbing.  GI:  Soft & nt; nml bowel sounds; no organomegaly or masses detected.   Musco: Warm bil, no deformities or joint swelling noted.   Neuro: alert, no focal deficits noted.    Skin: Warm, no lesions or rashes    Lab Results:  CBC    BNP   ProBNP No results found for: "PROBNP"  Imaging: DG Chest 2 View  Result Date: 02/23/2023 CLINICAL DATA:  Shortness of breath EXAM: CHEST - 2 VIEW COMPARISON:  02/15/2023 x-ray. Older exams as well including CT angiogram of 02/04/2023 FINDINGS: Hyperinflation. Normal cardiopericardial silhouette with calcified aorta. No consolidation, pneumothorax or effusion. No edema. Degenerative changes of the spine with multilevel compression deformities as described on prior CT. IMPRESSION: Hyperinflation.  Chronic changes. Electronically Signed   By: Jill Side M.D.   On: 02/23/2023 14:16   DG Chest Portable 1 View  Result Date: 02/15/2023 CLINICAL DATA:  Chest pain and shortness of breath for 2 days. EXAM: PORTABLE CHEST 1 VIEW COMPARISON:  None Available. FINDINGS: The heart size and mediastinal contours are within normal limits. Aortic atherosclerotic calcification incidentally noted. Both lungs are clear. Pulmonary hyperinflation again seen, consistent with COPD. IMPRESSION: COPD. No active disease. Electronically Signed   By: Marlaine Hind M.D.   On: 02/15/2023 14:27   CT Angio Chest PE W and/or Wo Contrast  Result Date: 02/04/2023 CLINICAL DATA:  Shortness of breath x2 days, bilateral lower extremity swelling, recent hospitalization for AFib, evaluate for PE EXAM: CT ANGIOGRAPHY CHEST WITH CONTRAST TECHNIQUE: Multidetector CT imaging of the chest was performed using the standard protocol during bolus  administration of intravenous contrast. Multiplanar CT image reconstructions and MIPs were obtained to evaluate the vascular anatomy. RADIATION DOSE REDUCTION: This exam was performed according to the departmental dose-optimization program which includes automated exposure control, adjustment of the mA and/or kV according to patient size and/or use of iterative reconstruction technique. CONTRAST:  62mL OMNIPAQUE IOHEXOL 350 MG/ML SOLN COMPARISON:  CTA chest dated 01/25/2023 FINDINGS: Cardiovascular: Satisfactory opacification the bilateral pulmonary arteries to the segmental level. No evidence of pulmonary embolism. Although not tailored for evaluation of the thoracic aorta, there is no evidence thoracic aortic aneurysm or dissection. Atherosclerotic calcifications of the aortic arch. Moderate three-vessel coronary atherosclerosis. Mediastinum/Nodes: No suspicious mediastinal lymphadenopathy. Visualized thyroid is unremarkable. Lungs/Pleura: Biapical pleural-parenchymal scarring. Moderate centrilobular and paraseptal emphysematous changes, upper lung predominant. No suspicious pulmonary nodules. No focal consolidation. No pleural effusion or pneumothorax. Upper Abdomen: Visualized upper abdomen is grossly unremarkable, noting vascular calcifications. Musculoskeletal: Moderate to severe compression fracture deformities at T4, T5, T7, and T8, chronic. Prior vertebral augmentation at L1. Review of the MIP images confirms the above findings. IMPRESSION: No evidence of pulmonary embolism. No evidence of acute cardiopulmonary disease. Aortic Atherosclerosis (ICD10-I70.0) and Emphysema (ICD10-J43.9). Electronically Signed   By: Julian Hy M.D.   On: 02/04/2023 11:48   DG Chest 2 View  Result Date: 02/04/2023 CLINICAL DATA:  cp EXAM: CHEST - 2 VIEW COMPARISON:  01/25/2023. FINDINGS: The heart size and mediastinal contours are within normal limits. Both lungs are clear. No pneumothorax or pleural effusion. Aorta  is calcified. There are thoracic degenerative changes. IMPRESSION: No active cardiopulmonary disease. Electronically Signed   By: Sammie Bench M.D.   On: 02/04/2023 10:48    cefTRIAXone (ROCEPHIN) injection 1 g     Date Action Dose Route User   Discharged on 02/23/2023   Admitted on 02/23/2023   Discharged on 02/21/2023   Admitted on 02/15/2023   Discharged on 02/07/2023  Admitted on 02/04/2023   Discharged on 01/28/2023   Admitted on 01/25/2023   Discharged on 01/19/2023   Admitted on 01/16/2023   01/06/2023 1600 Given 1 g Intramuscular (Left Ventrogluteal) Effie Shy, RN      triamcinolone acetonide [facility]) injection 80 mg     Date Action Dose Route User   Discharged on 02/23/2023   Admitted on 02/23/2023   Discharged on 02/21/2023   Admitted on 02/15/2023   Discharged on 02/07/2023   Admitted on 02/04/2023   Discharged on 01/28/2023   Admitted on 01/25/2023   Discharged on 01/19/2023   Admitted on 01/16/2023   01/06/2023 1601 Given 80 mg Intramuscular (Right Ventrogluteal) Effie Shy, RN          Latest Ref Rng & Units 05/29/2016    9:15 AM  PFT Results  FVC-Pre L 1.92   FVC-Predicted Pre % 52   Pre FEV1/FVC % % 41   FEV1-Pre L 0.78   FEV1-Predicted Pre % 29     No results found for: "NITRICOXIDE"      Assessment & Plan:   No problem-specific Assessment & Plan notes found for this encounter.     Rexene Edison, NP 03/05/2023

## 2023-03-05 NOTE — Telephone Encounter (Signed)
Patient's wife called stating that he is coming up due for his prolia and normally calls 1-2 weeks ahead of time and states she just requests a call back when the medication is available for patient to given here at the office.

## 2023-03-05 NOTE — Patient Instructions (Signed)
Continue on BREZTRI 2 puffs Twice daily, rinse after use.  Taper Prednisone to '10mg'$  daily- and hold at dose.  Mucinex Twice daily  As needed  Cough/congestion  Flutter valve Three times a day  .  Albuterol Neb or inhaler as needed  Wear Oxygen 2l/m  with activity and At bedtime   to maintain O2 sats >88-90%.  Activity as tolerated.  Aspirations precautions as discussed.  Set up for split night sleep study.  Follow up with Cardiology  Daily weights Continue on Lasix as directed.  Please contact office for sooner follow up if symptoms do not improve or worsen or seek emergency care  Follow up with Dr. Melvyn Novas  Or Marinna Blane NP in 3-4 weeks and As needed

## 2023-03-06 ENCOUNTER — Telehealth: Payer: Self-pay

## 2023-03-06 ENCOUNTER — Ambulatory Visit: Payer: Self-pay

## 2023-03-06 NOTE — Patient Outreach (Signed)
  Care Coordination   Follow Up Visit Note   03/06/2023 Name: RESHAWN OSTLUND MRN: [id number] DOB: Jul 13, 1946  JOURDEN GILSON is a 77 y.o. year old male who sees Cox, Kirsten, MD for primary care. I spoke with  [name] by phone today.  What matters to the patients health and wellness today?  Follow up call with patient today.  Patient reports that he is doing well. Reports breathing is ok.  Reports follow up with pulmonary yesterday and patient is scheduled for a sleep study on 03/11/2023.  Currently wife in rehab and family is staying with patient at all times.  Patient reports weight today of 151 pounds with slight swelling.  Reports that he is taking his medications as prescribed.     Goals Addressed               This Visit's Progress     Improve my breathinhg (pt-stated)        Interventions Today    Flowsheet Row Most Recent Value  Chronic Disease   Chronic disease during today's visit Chronic Obstructive Pulmonary Disease (COPD), Congestive Heart Failure (CHF), Atrial Fibrillation (AFib)  General Interventions   General Interventions Discussed/Reviewed General Interventions Reviewed, Durable Medical Equipment (DME)  Doctor Visits Discussed/Reviewed Doctor Visits Discussed, Doctor Visits Reviewed  Durable Medical Equipment (DME) Other  [reviewed importance of continuing to weigh daily.]  PCP/Specialist Visits Compliance with follow-up visit  Education Interventions   Provided Verbal Education On When to see the doctor, Medication  Pharmacy Interventions   Pharmacy Dicussed/Reviewed Medication Adherence  [reviewed with patient that he is taking all his medications as prescribed.]              SDOH assessments and interventions completed:  No     Care Coordination Interventions:  Yes, provided   Follow up plan: Follow up call scheduled for 03/10/2023    Encounter Outcome:  Pt. Visit Completed   Tomasa Rand, RN, BSN, CEN [place]  Coordinator [phone]

## 2023-03-06 NOTE — Patient Outreach (Signed)
  Care Coordination   Multidisciplinary Case Review Note    03/06/2023 Name: Paul [name] MRN: [id number] DOB: 1946-02-16  Paul [name] is a 77 y.o. year old male who sees Cox, Kirsten, MD for primary care.  The  multidisciplinary care team met today to review patient care needs and barriers.     SDOH assessments and interventions completed:  No     Care Coordination Interventions Activated:  No   Care Coordination Interventions:  Yes, provided   Follow up plan: as previously planned  Multidisciplinary Team Attendees:   Tomasa Rand RN Richarda Osmond Tousey RN Eduard Clos LCSW  Scribe for Multidisciplinary Case Review:  Tomasa Rand RN

## 2023-03-07 NOTE — Assessment & Plan Note (Addendum)
Recurrent COPD exacerbations with multiple hospitalizations.  Patient is on triple therapy maintenance inhaler, nebulizer, chronic steroids.  Intolerant to [employer].  Unable to get noninvasive vent or BiPAP last year with no significant hypercarbia on baseline ABG.  Will set patient up for a sleep study to evaluate for possible sleep apnea component.  Will taper prednisone down to 10 mg and hold at this dose Plan  Patient Instructions  Continue on BREZTRI 2 puffs Twice daily, rinse after use.  Taper Prednisone to 10mg  daily- and hold at dose.  Mucinex Twice daily  As needed  Cough/congestion  Flutter valve Three times a day  .  Albuterol Neb or inhaler as needed  Wear Oxygen 2l/m  with activity and At bedtime   to maintain O2 sats >88-90%.  Activity as tolerated.  Aspirations precautions as discussed.  Set up for split night sleep study.  Follow up with Cardiology  Daily weights Continue on Lasix as directed.  Please contact office for sooner follow up if symptoms do not improve or worsen or seek emergency care  Follow up with Dr. Melvyn Novas  Or Rashad Auld NP in 3-4 weeks and As needed

## 2023-03-07 NOTE — Assessment & Plan Note (Signed)
Continue on oxygen to maintain O2 saturations greater than 88 to 90%. 

## 2023-03-07 NOTE — Assessment & Plan Note (Signed)
Appears euvolemic on exam.  Continue on current regimen 

## 2023-03-10 ENCOUNTER — Ambulatory Visit: Payer: Self-pay

## 2023-03-10 ENCOUNTER — Telehealth: Payer: Self-pay

## 2023-03-10 NOTE — Patient Outreach (Signed)
  Care Coordination   Follow Up Visit Note   03/10/2023 Name: Paul [name] MRN: [id number] DOB: [birth year]  Paul [name] is a 77 y.o. year old male who sees Cox, Kirsten, MD for primary care. I spoke with  Paul [name] by phone today.  What matters to the patients health and wellness today?  Follow up call with patient as a touch base about weight.  Reports reports that he is doing well. Denies weight gain, swelling or shortness of breath.  Denies any concerns about his medications.     Goals Addressed               This Visit's Progress     Improve my breathinhg (pt-stated)        Interventions Today    Flowsheet Row Most Recent Value  Chronic Disease   Chronic disease during today's visit Chronic Obstructive Pulmonary Disease (COPD), Congestive Heart Failure (CHF)  General Interventions   General Interventions Discussed/Reviewed General Interventions Reviewed  [Reviewed current weight and trend]  PCP/Specialist Visits Compliance with follow-up visit  Education Interventions   Education Provided Provided Education  [Reviewed reason for call for close follow up due to numerous readmissions.  Next follow up planned for 10 days.]  Nutrition Interventions   Nutrition Discussed/Reviewed Decreasing salt  Pharmacy Interventions   Pharmacy Dicussed/Reviewed Medications and their functions              SDOH assessments and interventions completed:  No     Care Coordination Interventions:  Yes, provided   Follow up plan: Follow up call scheduled for 03/19/2023    Encounter Outcome:  Pt. Visit Completed   Tomasa Rand, RN, BSN, CEN [place] Coordinator [phone]

## 2023-03-10 NOTE — Progress Notes (Cosign Needed Addendum)
Care Management & Coordination Services Pharmacy Team  Reason for Encounter: Hypertension  Contacted patient to discuss hypertension disease state. Spoke with patient on 03/10/2023     Current antihypertensive regimen:  Amlodipine 2.5mg  daily Carvedilol 3.125mg  two times daily Digoxin 0.125mg  daily  Furosemide 40mg  daily prn  Patient verbally confirms he is taking the above medications as directed. Yes  How often are you checking your Blood Pressure? daily  he checks his blood pressure in the morning before taking his medication.  Current home BP readings: 03/02/23 126/60 80  03/03/23 132/70 70, 03/04/23 128/66 94, 03/05/23 120/40 84  Wrist or arm cuff:Arm  Caffeine intake:None  Salt intake:Limited  OTC medications including pseudoephedrine or NSAIDs? Tylenol prn   Any readings above 180/100? No  What recent interventions/DTPs have been made by any provider to improve Blood Pressure control since last CPP Visit:  Pt was put on BP medications   Any recent hospitalizations or ED visits since last visit with CPP? Yes  What diet changes have been made to improve Blood Pressure Control?  Low sodium/Carb Diet   What exercise is being done to improve your Blood Pressure Control?  Pt goes to the gym 3 days a week  when its warm outside   Adherence Review: Is the patient currently on ACE/ARB medication? No Does the patient have >5 day gap between last estimated fill dates? No  Star Rating Drugs:  Medication:  Last Fill: Day Supply None noted   Chart Updates: Recent office visits:  02/12/23 [name] MD. Seen for CHF. No med changes.  Recent consult visits:  03/05/23 (Pulmonologist) Rexene Edison NP. Seen for Hospital follow. Taper Prednisone to 10mg  daily- and hold at dose. Mucinex Twice daily  As needed  Cough/congestion.  02/11/23 (Cardiology)Woody, Anderson Malta NP. Seen for Paroxysmal Atrial Fib. Started on Amlodipine Besylate 2.5mg . Decrease: Carvedilol 3.125mg  twice  daily.  02/10/23 (Pulmonologist) Christinia Gully MD. Seen for COPD. No med changes.   Hospital visits:  # 1 Medication Reconciliation was completed by comparing discharge summary, patient's EMR and Pharmacy list, and upon discussion with patient.  Admitted to the hospital on 02/23/23 due to COPD. Discharge date was 02/23/23. Discharged from [place]?Medications Started at [facility] Discharge:?? -started  Doxycycline Hyclate 100 mg Oral 2 times daily    All other medications will remain the same.    # 2 Admitted to the hospital on 02/15/23 due to COPD. Discharge date was 02/21/23. Discharged from [place]?Medications Started at [facility] Discharge:?? Start: predniSONE ([place])  CHANGE how you take: furosemide (LASIX)  -All other medications will remain the same.    # 3 Medication Reconciliation was completed by comparing discharge summary, patient's EMR and Pharmacy list, and upon discussion with patient.  Admitted to the hospital on 02/04/23 due to Hypoxia. Discharge date was 02/07/23. Discharged from [facility].    Start taking carvedilol (COREG) digoxin (LANOXIN) doxycycline (VIBRA-TABS) furosemide (LASIX)  CHANGE how you take: predniSONE (DELTASONE)  STOP taking: guaiFENesin 600 MG 12 hr tablet (MUCINEX)    All other medications will remain the same.    # 4 Medication Reconciliation was completed by comparing discharge summary, patient's EMR and Pharmacy list, and upon discussion with patient.  Admitted to the hospital on 01/25/23 due to Atrial Fib. Discharge date was 01/28/23. Discharged from [facility].    Start taking doxycycline (VIBRAMYCIN)  CHANGE how you take: predniSONE (DELTASONE)  All other medications remain the  same  Medications: Outpatient Encounter Medications as of 03/10/2023  Medication Sig   acetaminophen (TYLENOL) 500 MG tablet Take 1,000 mg by mouth at bedtime as needed (pain).   albuterol  (PROAIR HFA) 108 (90 Base) MCG/ACT inhaler Inhale 2 puffs into the lungs as needed for wheezing or shortness of breath.   albuterol (PROVENTIL) (2.5 MG/3ML) 0.083% nebulizer solution Take 3 mLs (2.5 mg total) by nebulization every 6 (six) hours as needed for shortness of breath or wheezing.   ALPRAZolam (XANAX) 0.5 MG tablet Take 1 tablet (0.5 mg total) by mouth 2 (two) times daily as needed for anxiety.   amLODipine (NORVASC) 2.5 MG tablet Take 1 tablet (2.5 mg total) by mouth daily.   Ascorbic Acid (VITAMIN C) 1000 MG tablet Take 1,000 mg by mouth daily.   aspirin EC 81 MG tablet Take 1 tablet (81 mg total) by mouth daily. Swallow whole.   atorvastatin (LIPITOR) 40 MG tablet TAKE 1 TABLET BY MOUTH EVERYDAY AT BEDTIME   Budeson-Glycopyrrol-Formoterol (BREZTRI AEROSPHERE) 160-9-4.8 MCG/ACT AERO Inhale 2 puffs into the lungs 2 (two) times daily.   calcium carbonate (OS-CAL) 600 MG TABS tablet Take 600 mg by mouth daily.   carvedilol (COREG) 3.125 MG tablet Take 1 tablet (3.125 mg total) by mouth 2 (two) times daily with a meal.   Cholecalciferol (VITAMIN D3) 50 MCG (2000 UT) TABS Take 2,000 Units by mouth in the morning.   denosumab (PROLIA) 60 MG/ML SOSY injection Inject 60 mg into the skin every 6 (six) months.   digoxin (LANOXIN) 0.125 MG tablet Take 1 tablet (0.125 mg total) by mouth daily.   ferrous sulfate 325 (65 FE) MG tablet Take 325 mg by mouth 2 (two) times daily with a meal.   fluticasone (FLONASE) 50 MCG/ACT nasal spray Place 2 sprays into both nostrils daily as needed for allergies or rhinitis.   furosemide (LASIX) 40 MG tablet Take 1 tablet (40 mg total) by mouth 2 (two) times daily.   furosemide (LASIX) 40 MG tablet Take 1 tablet (40 mg total) by mouth daily as needed (Take additional dose of Lasix if weight greater than 2 pounds from previous day or greater than 3 pounds since previous 2 days).   loratadine (CLARITIN) 10 MG tablet Take 10 mg by mouth in the morning.   Melatonin 12  MG TABS Take 12 mg by mouth at bedtime.   Multiple Vitamin (MULTIVITAMIN WITH MINERALS) TABS tablet Take 1 tablet by mouth daily with breakfast.   OXYGEN Inhale 2 L/min into the lungs at bedtime.   pantoprazole (PROTONIX) 40 MG tablet Take 1 tablet (40 mg total) by mouth 2 (two) times daily.   Probiotic Product (PROBIOTIC DAILY) CAPS Take 1 capsule by mouth in the morning.   sertraline (ZOLOFT) 50 MG tablet Take 1 tablet (50 mg total) by mouth at bedtime.   No facility-administered encounter medications on file as of 03/10/2023.    Recent Office Vitals: BP Readings from Last 3 Encounters:  03/05/23 (!) 120/40  02/23/23 (!) 141/61  02/21/23 (!) 135/51   Pulse Readings from Last 3 Encounters:  03/05/23 84  02/23/23 85  02/21/23 74    Wt Readings from Last 3 Encounters:  03/06/23 151 lb (68.5 kg)  03/05/23 156 lb 6.4 oz (70.9 kg)  03/03/23 153 lb 8 oz (69.6 kg)     Kidney Function Lab Results  Component Value Date/Time   CREATININE 0.91 02/23/2023 04:06 PM   CREATININE 1.12 02/21/2023 04:28 AM   GFRNONAA >60  02/23/2023 04:06 PM   GFRAA 107 11/20/2020 12:02 PM       Latest Ref Rng & Units 02/23/2023    4:06 PM 02/23/2023    3:06 PM 02/21/2023    4:28 AM  BMP  Glucose 70 - 99 mg/dL 210   109   BUN 8 - 23 mg/dL 29   23   Creatinine 0.61 - 1.24 mg/dL 0.91   1.12   Sodium 135 - 145 mmol/L 131  130  135   Potassium 3.5 - 5.1 mmol/L 3.7  4.7  4.3   Chloride 98 - 111 mmol/L 87   89   CO2 22 - 32 mmol/L 34   35   Calcium 8.9 - 10.3 mg/dL 8.2   9.3      [name], [place] Pharmacist Assistant  [phone]

## 2023-03-11 ENCOUNTER — Ambulatory Visit (HOSPITAL_BASED_OUTPATIENT_CLINIC_OR_DEPARTMENT_OTHER): Payer: Medicare Other | Attending: Adult Health | Admitting: Pulmonary Disease

## 2023-03-11 ENCOUNTER — Other Ambulatory Visit: Payer: Self-pay

## 2023-03-11 DIAGNOSIS — G4733 Obstructive sleep apnea (adult) (pediatric): Secondary | ICD-10-CM | POA: Insufficient documentation

## 2023-03-11 DIAGNOSIS — R0683 Snoring: Secondary | ICD-10-CM | POA: Diagnosis not present

## 2023-03-11 DIAGNOSIS — J449 Chronic obstructive pulmonary disease, unspecified: Secondary | ICD-10-CM | POA: Diagnosis not present

## 2023-03-11 DIAGNOSIS — I482 Chronic atrial fibrillation, unspecified: Secondary | ICD-10-CM | POA: Diagnosis not present

## 2023-03-11 DIAGNOSIS — M8000XD Age-related osteoporosis with current pathological fracture, unspecified site, subsequent encounter for fracture with routine healing: Secondary | ICD-10-CM

## 2023-03-11 MED ORDER — DENOSUMAB 60 MG/ML ~~LOC~~ SOSY: 60.0000 mg | PREFILLED_SYRINGE | SUBCUTANEOUS | 0 refills | Status: AC

## 2023-03-12 DIAGNOSIS — J449 Chronic obstructive pulmonary disease, unspecified: Secondary | ICD-10-CM | POA: Diagnosis not present

## 2023-03-12 NOTE — Procedures (Signed)
Patient Name: Paul [name], Paul [name] Date: 03/11/2023 Gender: Male D.O.B: 11/18/1946 Age (years): 7 Referring Provider: Lynelle Smoke Parrett Height (inches): 66 Interpreting Physician: Chesley Mires MD, ABSM Weight (lbs): 150 RPSGT: Jorge Ny BMI: 24 MRN: [id number] Neck Size: 17.00  CLINICAL INFORMATION Sleep Study Type: Split Night CPAP  Indication for sleep study: COPD, Diabetes, Hypertension  Epworth Sleepiness Score: 7  SLEEP STUDY TECHNIQUE As per the AASM Manual for the Scoring of Sleep and Associated Events v2.3 (April 2016) with a hypopnea requiring 4% desaturations.  The channels recorded and monitored were frontal, central and occipital EEG, electrooculogram (EOG), submentalis EMG (chin), nasal and oral airflow, thoracic and abdominal wall motion, anterior tibialis EMG, snore microphone, electrocardiogram, and pulse oximetry. Continuous positive airway pressure (CPAP) was initiated when the patient met split night criteria and was titrated according to treat sleep-disordered breathing.  MEDICATIONS Medications self-administered by patient taken the night of the study : ATORVASTATIN, Breztri, CARVEDILOL, FERROUS SULFATE, MELATONIN, SERTRALINE  RESPIRATORY PARAMETERS Diagnostic  Total AHI (/hr): 13.9 RDI (/hr): 15.9 OA Index (/hr): 13.9 CA Index (/hr): 0.0 REM AHI (/hr): N/A NREM AHI (/hr): 13.9 Supine AHI (/hr): 26.8 Non-supine AHI (/hr): 0 Min O2 Sat (%): 93.0 Mean O2 (%): 96.2 Time below 88% (min): 0   Titration  Optimal Pressure (cm): 9 AHI at Optimal Pressure (/hr): 7.2 Min O2 at Optimal Pressure (%): 86.0 Supine % at Optimal (%): 100 Sleep % at Optimal (%): 79   He had 2 liters supplemental oxygen applied from the beginning of the study.  SLEEP ARCHITECTURE The recording time for the entire night was 419 minutes.  During a baseline period of 213.2 minutes, the patient slept for 147.2 minutes in REM and nonREM, yielding a sleep efficiency of 69.0%.  Sleep onset after lights out was 34.0 minutes with a REM latency of N/A minutes. The patient spent 9.9% of the night in stage N1 sleep, 90.1% in stage N2 sleep, 0.0% in stage N3 and 0% in REM.  During the titration period of 198.2 minutes, the patient slept for 170.5 minutes in REM and nonREM, yielding a sleep efficiency of 86.0%. Sleep onset after CPAP initiation was 4.8 minutes with a REM latency of 23.0 minutes. The patient spent 3.8% of the night in stage N1 sleep, 70.4% in stage N2 sleep, 0.0% in stage N3 and 25.8% in REM.  CARDIAC DATA The 2 lead EKG demonstrated sinus rhythm. The mean heart rate was 100.0 beats per minute. Other EKG findings include: None.  LEG MOVEMENT DATA The total Periodic Limb Movements of Sleep (PLMS) were 0. The PLMS index was 0.0 .  IMPRESSIONS - Mild obstructive sleep apnea with an AHI of 13.9 and SpO2 low of 93%. - He used 2 liters supplemental oxygen for the duration of the study, including with CPAP. - He did best with CPAP 9 cm H2O with 2 liters supplemental oxygen.  DIAGNOSIS - Obstructive Sleep Apnea (G47.33)  RECOMMENDATIONS - CPAP 9 cm H2O with 2 liters supplemental oxygen. - He was fitted with a medium size [employer] Simplus mask. - Avoid alcohol, sedatives and other CNS depressants that may worsen sleep apnea and disrupt normal sleep architecture. - Sleep hygiene should be reviewed to assess factors that may improve sleep quality. - Weight management and regular exercise should be initiated or continued.  [Electronically signed] 03/12/2023 08:31 AM  Chesley Mires MD, ABSM Diplomate, American Board of Sleep Medicine NPI: [id number]  [place]: [phone]  [place]: [phone] [place]

## 2023-03-13 ENCOUNTER — Ambulatory Visit (INDEPENDENT_AMBULATORY_CARE_PROVIDER_SITE_OTHER): Payer: Medicare Other

## 2023-03-13 ENCOUNTER — Telehealth: Payer: Self-pay | Admitting: Adult Health

## 2023-03-13 DIAGNOSIS — G4733 Obstructive sleep apnea (adult) (pediatric): Secondary | ICD-10-CM

## 2023-03-13 DIAGNOSIS — M8000XD Age-related osteoporosis with current pathological fracture, unspecified site, subsequent encounter for fracture with routine healing: Secondary | ICD-10-CM

## 2023-03-13 MED ORDER — DENOSUMAB 60 MG/ML ~~LOC~~ SOSY
60.0000 mg | PREFILLED_SYRINGE | Freq: Once | SUBCUTANEOUS | Status: AC
  Administered 2023-03-13: 60 mg via SUBCUTANEOUS

## 2023-03-13 NOTE — Telephone Encounter (Signed)
Split-night sleep study March 11, 2023 showed mild obstructive sleep apnea with nocturnal hypoxemia. Optimal control on CPAP 9 cm H2O with 2 L of oxygen. Please begin CPAP 9 cm H2O.  At bedtime with 2 L of oxygen.-Please send order to homecare company. Will discuss in full detail at follow-up visit next month

## 2023-03-13 NOTE — Telephone Encounter (Signed)
Patient is aware of results and voiced his understanding.  He agrees with plan.  Order placed to [facility] for cpap with 2L bled in. Nothing further needed.

## 2023-03-13 NOTE — Progress Notes (Signed)
Paul [name] comes in for prolia injection.  He has been doing well with no adverse reaction noted.

## 2023-03-15 ENCOUNTER — Other Ambulatory Visit: Payer: Self-pay | Admitting: Family Medicine

## 2023-03-19 ENCOUNTER — Ambulatory Visit: Payer: Self-pay

## 2023-03-19 DIAGNOSIS — C44519 Basal cell carcinoma of skin of other part of trunk: Secondary | ICD-10-CM | POA: Diagnosis not present

## 2023-03-19 DIAGNOSIS — C44529 Squamous cell carcinoma of skin of other part of trunk: Secondary | ICD-10-CM | POA: Diagnosis not present

## 2023-03-19 DIAGNOSIS — L570 Actinic keratosis: Secondary | ICD-10-CM | POA: Diagnosis not present

## 2023-03-19 NOTE — Telephone Encounter (Signed)
K,

## 2023-03-19 NOTE — Patient Outreach (Signed)
  Care Coordination   Follow Up Visit Note   03/19/2023 Name: Paul [name] MRN: [id number] DOB: [birth year]  Paul [name] is a 77 y.o. year old male who sees Cox, Kirsten, MD for primary care. I spoke with  Paul [name] by phone today.  What matters to the patients health and wellness today?  Follow up call with patient. Patient reports that care connection is not coming out until 03/20/2023.  Patient reports that he had his sleep study and that he needs a CPAP machine. Reports order was placed yesterday. Patient reports that he is hopeful that this will help him stay out of the hospital. Reports that he sleep well with the Cpap machine during the study.  Patient reports weight stable running 149-151 pounds. Denies any swelling at this time. Continues to take his medications as prescribed.     Goals Addressed               This Visit's Progress     Improve my breathinhg (pt-stated)          Interventions Today    Flowsheet Row Most Recent Value  Chronic Disease   Chronic disease during today's visit Chronic Obstructive Pulmonary Disease (COPD), Congestive Heart Failure (CHF)  General Interventions   General Interventions Discussed/Reviewed General Interventions Reviewed, Durable Medical Equipment (DME)  Reola Calkins order for CPAP]  Doctor Visits Discussed/Reviewed Doctor Visits Discussed, Doctor Visits Reviewed  Durable Medical Equipment (DME) Other, Oxygen  [CPAP]  PCP/Specialist Visits Compliance with follow-up visit  Education Interventions   Education Provided Provided Education  [Encouraged patient to continue to weigh daily and take medications as prescribed. Reviewed with patient to call me if needed.]  Provided Verbal Education On Other  [daily weights]  Nutrition Interventions   Nutrition Discussed/Reviewed Decreasing salt  Pharmacy Interventions   Pharmacy Dicussed/Reviewed Medications and their functions, Medication Adherence              SDOH assessments and  interventions completed:  No     Care Coordination Interventions:  Yes, provided   Follow up plan: Follow up call scheduled for 04/09/2023    Encounter Outcome:  Pt. Visit Completed   Tomasa Rand, RN, BSN, CEN [place] Coordinator [phone]

## 2023-03-20 ENCOUNTER — Telehealth: Payer: Self-pay

## 2023-03-20 NOTE — Patient Outreach (Signed)
  Care Coordination   Multidisciplinary Case Review Note    03/20/2023 Name: Paul [name] MRN: [id number] DOB: 05-25-1946  Paul [name] is a 77 y.o. year old male who sees Cox, Kirsten, MD for primary care.  The  multidisciplinary care team met today to review patient care needs and barriers.      SDOH assessments and interventions completed:  No     Care Coordination Interventions Activated:  Yes   Care Coordination Interventions:  Yes, provided   Follow up plan:  as previously scheduled.    Multidisciplinary Team Attendees:   Eduard Clos LCSW Linden Tousey  RN Tomasa Rand RN  Scribe for Multidisciplinary Case Review:  Tomasa Rand RN

## 2023-03-21 ENCOUNTER — Other Ambulatory Visit: Payer: Medicare Other

## 2023-03-21 DIAGNOSIS — E782 Mixed hyperlipidemia: Secondary | ICD-10-CM | POA: Diagnosis not present

## 2023-03-21 DIAGNOSIS — E1169 Type 2 diabetes mellitus with other specified complication: Secondary | ICD-10-CM

## 2023-03-21 DIAGNOSIS — E1159 Type 2 diabetes mellitus with other circulatory complications: Secondary | ICD-10-CM | POA: Diagnosis not present

## 2023-03-21 DIAGNOSIS — I5032 Chronic diastolic (congestive) heart failure: Secondary | ICD-10-CM

## 2023-03-21 DIAGNOSIS — I152 Hypertension secondary to endocrine disorders: Secondary | ICD-10-CM | POA: Diagnosis not present

## 2023-03-22 LAB — COMPREHENSIVE METABOLIC PANEL
ALT: 42 IU/L (ref 0–44)
AST: 27 IU/L (ref 0–40)
Albumin/Globulin Ratio: 2 (ref 1.2–2.2)
Albumin: 4.3 g/dL (ref 3.8–4.8)
Alkaline Phosphatase: 89 IU/L (ref 44–121)
BUN/Creatinine Ratio: 12 (ref 10–24)
BUN: 10 mg/dL (ref 8–27)
Bilirubin Total: 0.3 mg/dL (ref 0.0–1.2)
CO2: 25 mmol/L (ref 20–29)
Calcium: 9.9 mg/dL (ref 8.6–10.2)
Chloride: 97 mmol/L (ref 96–106)
Creatinine, Ser: 0.81 mg/dL (ref 0.76–1.27)
Globulin, Total: 2.2 g/dL (ref 1.5–4.5)
Glucose: 140 mg/dL — ABNORMAL HIGH (ref 70–99)
Potassium: 4.1 mmol/L (ref 3.5–5.2)
Sodium: 141 mmol/L (ref 134–144)
Total Protein: 6.5 g/dL (ref 6.0–8.5)
eGFR: 91 mL/min/{1.73_m2} (ref >59)

## 2023-03-22 LAB — CBC WITH DIFFERENTIAL/PLATELET
Basophils Absolute: 0 10*3/uL (ref 0.0–0.2)
Basos: 0 %
EOS (ABSOLUTE): 0 10*3/uL (ref 0.0–0.4)
Eos: 0 %
Hematocrit: 37.2 % — ABNORMAL LOW (ref 37.5–51.0)
Hemoglobin: 11.7 g/dL — ABNORMAL LOW (ref 13.0–17.7)
Immature Grans (Abs): 0.1 10*3/uL (ref 0.0–0.1)
Immature Granulocytes: 1 %
Lymphocytes Absolute: 1.8 10*3/uL (ref 0.7–3.1)
Lymphs: 13 %
MCH: 26.2 pg — ABNORMAL LOW (ref 26.6–33.0)
MCHC: 31.5 g/dL (ref 31.5–35.7)
MCV: 83 fL (ref 79–97)
Monocytes Absolute: 0.7 10*3/uL (ref 0.1–0.9)
Monocytes: 5 %
Neutrophils Absolute: 11.4 10*3/uL — ABNORMAL HIGH (ref 1.4–7.0)
Neutrophils: 81 %
Platelets: 332 10*3/uL (ref 150–450)
RBC: 4.47 x10E6/uL (ref 4.14–5.80)
RDW: 14.9 % (ref 11.6–15.4)
WBC: 14 10*3/uL — ABNORMAL HIGH (ref 3.4–10.8)

## 2023-03-22 LAB — HEMOGLOBIN A1C
Est. average glucose Bld gHb Est-mCnc: 143 mg/dL
Hgb A1c MFr Bld: 6.6 % — ABNORMAL HIGH (ref 4.8–5.6)

## 2023-03-22 LAB — LIPID PANEL
Chol/HDL Ratio: 2.2 ratio (ref 0.0–5.0)
Cholesterol, Total: 167 mg/dL (ref 100–199)
HDL: 76 mg/dL (ref >39)
LDL Chol Calc (NIH): 65 mg/dL (ref 0–99)
Triglycerides: 156 mg/dL — ABNORMAL HIGH (ref 0–149)
VLDL Cholesterol Cal: 26 mg/dL (ref 5–40)

## 2023-03-22 LAB — CARDIOVASCULAR RISK ASSESSMENT

## 2023-03-25 NOTE — Progress Notes (Unsigned)
Subjective:  Patient ID: Paul [name], male    DOB: 08/30/1946  Age: 77 y.o. MRN: [id number]  Chief Complaint  Patient presents with   Congestive Heart Failure   COPD    HPI Diabetes:  Complications: none Glucose checking: no Glucose logs: no Hypoglycemia: no  Most recent A1C:6.6 Current medications:  none Last Eye Exam: summer 2022 Foot checks: daily   Hyperlipidemia: Current medications: on lipitor 40 mg once daily.    Hypertension/Atrial fibrillation: Current medications: Norvasc 2.5 mg daily, Lasix 40 mg 40 MG TWICE DAILY.   COPD/Chronic respiratory failure with hypoxia: ON breztri 2 puffs twice daily. ALBUTEROL NEBULIZERS TWICE DAILY. On home oxygen 2L.  Mild obstructive sleep apnea with AHI at 13.9 and SpO2 low at 93%.  Required 2 L of oxygen with Nocturnal CPAP at 9 cm H2O, which he received yesterday.  He wore it for 9-1/2 hours last night. He slept better and not quite as short of breath when he got up.   GERD: on protonix 40 mg twice daily.  GAD: on zoloft 50 mg once daily. Denies depression or anxiety. Has alprazolam once daily as needed. Very sparingly.   Osteoporosis: on prolia every 6 months and vitamin d 2000 U daily   Diet: Fairly healthy. Exercise: walks when able     03/26/2023    8:54 AM 11/22/2022    9:35 AM 08/16/2022    8:20 AM 02/15/2022    8:33 AM 01/14/2022   11:14 AM  Depression screen PHQ 2/9  Decreased Interest 0 0 0 0 0  Down, Depressed, Hopeless 0 0 0 0 0  PHQ - 2 Score 0 0 0 0 0         07/20/2022    8:00 AM 07/20/2022   11:00 PM 07/21/2022   10:00 AM 08/16/2022    8:20 AM 11/22/2022    9:34 AM  Fall Risk  Falls in the past year?    1 0  Was there an injury with Fall?    0 0  Fall Risk Category Calculator    1 0  Fall Risk Category (Retired)    Low Low  (RETIRED) Patient Fall Risk Level Moderate fall risk Moderate fall risk High fall risk Low fall risk Low fall risk  Patient at Risk for Falls Due to    History of fall(s) History  of fall(s)  Fall risk Follow up    Falls evaluation completed Falls evaluation completed      Review of Systems  Constitutional:  Negative for chills, diaphoresis, fatigue and fever.  HENT:  Negative for congestion, ear pain and sore throat.   Respiratory:  Positive for cough (productive green sputum) and shortness of breath.   Cardiovascular:  Negative for chest pain and leg swelling.  Gastrointestinal:  Negative for abdominal pain, constipation, diarrhea, nausea and vomiting.  Genitourinary:  Negative for dysuria and urgency.  Musculoskeletal:  Negative for arthralgias and myalgias.  Neurological:  Positive for light-headedness. Negative for dizziness and headaches.  Psychiatric/Behavioral:  Negative for dysphoric mood. The patient is not nervous/anxious.     Current Outpatient Medications on File Prior to Visit  Medication Sig Dispense Refill   acetaminophen (TYLENOL) 500 MG tablet Take 1,000 mg by mouth at bedtime as needed (pain).     albuterol (PROAIR HFA) 108 (90 Base) MCG/ACT inhaler Inhale 2 puffs into the lungs as needed for wheezing or shortness of breath.     albuterol (PROVENTIL) (2.5 MG/3ML) 0.083% nebulizer solution Take  3 mLs (2.5 mg total) by nebulization every 6 (six) hours as needed for shortness of breath or wheezing. 360 mL 5   ALPRAZolam (XANAX) 0.5 MG tablet Take 1 tablet (0.5 mg total) by mouth 2 (two) times daily as needed for anxiety. 30 tablet 0   amLODipine (NORVASC) 2.5 MG tablet Take 1 tablet (2.5 mg total) by mouth daily. 90 tablet 3   Ascorbic Acid (VITAMIN C) 1000 MG tablet Take 1,000 mg by mouth daily.     aspirin EC 81 MG tablet Take 1 tablet (81 mg total) by mouth daily. Swallow whole. 90 tablet 3   atorvastatin (LIPITOR) 40 MG tablet TAKE 1 TABLET BY MOUTH EVERYDAY AT BEDTIME 90 tablet 1   Budeson-Glycopyrrol-Formoterol (BREZTRI AEROSPHERE) 160-9-4.8 MCG/ACT AERO Inhale 2 puffs into the lungs 2 (two) times daily. 3 each 3   calcium carbonate (OS-CAL)  600 MG TABS tablet Take 600 mg by mouth daily.     carvedilol (COREG) 3.125 MG tablet Take 1 tablet (3.125 mg total) by mouth 2 (two) times daily with a meal. 180 tablet 3   Cholecalciferol (VITAMIN D3) 50 MCG (2000 UT) TABS Take 2,000 Units by mouth in the morning.     denosumab (PROLIA) 60 MG/ML SOSY injection Inject 60 mg into the skin every 6 (six) months. 180 mL 0   digoxin (LANOXIN) 0.125 MG tablet Take 1 tablet (0.125 mg total) by mouth daily. 30 tablet 2   ferrous sulfate 325 (65 FE) MG tablet Take 325 mg by mouth 2 (two) times daily with a meal.     fluticasone (FLONASE) 50 MCG/ACT nasal spray Place 2 sprays into both nostrils daily as needed for allergies or rhinitis.     furosemide (LASIX) 40 MG tablet Take 1 tablet (40 mg total) by mouth 2 (two) times daily. 60 tablet 2   furosemide (LASIX) 40 MG tablet Take 1 tablet (40 mg total) by mouth daily as needed (Take additional dose of Lasix if weight greater than 2 pounds from previous day or greater than 3 pounds since previous 2 days). 30 tablet 11   loratadine (CLARITIN) 10 MG tablet Take 10 mg by mouth in the morning.     Melatonin 12 MG TABS Take 12 mg by mouth at bedtime.     Multiple Vitamin (MULTIVITAMIN WITH MINERALS) TABS tablet Take 1 tablet by mouth daily with breakfast.     OXYGEN Inhale 2 L/min into the lungs at bedtime.     pantoprazole (PROTONIX) 40 MG tablet Take 1 tablet (40 mg total) by mouth 2 (two) times daily. 180 tablet 3   Probiotic Product (PROBIOTIC DAILY) CAPS Take 1 capsule by mouth in the morning.     sertraline (ZOLOFT) 50 MG tablet TAKE 1 TABLET BY MOUTH EVERYDAY AT BEDTIME 90 tablet 1   No current facility-administered medications on file prior to visit.   Past Medical History:  Diagnosis Date   Anemia    Aortic atherosclerosis    Aspiration pneumonia due to inhalation of vomitus 02/03/2022   Atrial fibrillation    Back pain    COPD (chronic obstructive pulmonary disease)    COPD exacerbation  12/27/2021   COVID-19 12/24/2020   Diabetes mellitus without complication    Dizziness 04/08/2022   Elevated PSA    GAD (generalized anxiety disorder)    GERD (gastroesophageal reflux disease)    High cholesterol    Hypertension    Lingular pneumonia 01/05/2021   See cxr 01/06/20 rx with zpak/ cefipime x  5 days and then developed purulent sputum again 01/12/21 so rx levcaquin 500 mg daily x 7 days then return for cxr before more    Lung nodule < 6cm on CT 05/29/2016   Osteoporosis    Oxygen deficiency    Pneumonia    January 2022   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 Lalla Brothers with a 20 mm Watchman FLX device   RSV bronchitis 01/27/2022   Vertebral compression fracture 05/29/2019   T8 compression fracture noted on CT scan 05/28/2019 inpatient with chronic steroid dependent COPD - rx calcitonin nasal spray rx per PCP    Past Surgical History:  Procedure Laterality Date   CATARACT EXTRACTION Right    ELBOW SURGERY  07/2022   surgery on olecranon bursa. Dr. Yehuda Budd   IR KYPHO EA ADDL LEVEL THORACIC OR LUMBAR  11/28/2021   LEFT ATRIAL APPENDAGE OCCLUSION N/A 08/02/2021   Procedure: LEFT ATRIAL APPENDAGE OCCLUSION;  Surgeon: Tonny Bollman, MD;  Location: [facility] INVASIVE CV LAB;  Service: Cardiovascular;  Laterality: N/A;   SKIN SURGERY     shoulders and chest   TEE WITHOUT CARDIOVERSION N/A 08/02/2021   Procedure: TRANSESOPHAGEAL ECHOCARDIOGRAM (TEE);  Surgeon: Tonny Bollman, MD;  [facility] INVAS[facility] CV LAB;  Service: Cardiovascular;  Laterality: N/A;   TEE WITHOUT CARDIOVERSION N/A 09/13/2021   Procedure: TRANSESOPHAGEAL ECHOCARDIOGRAM (TEE);  Surgeon: Thurmon Fair, MD;  Location: [facility] ENDOSCOPY;  Service: Cardiovascular;  Laterality: N/A;    Family History  Problem Relation Age of Onset   Heart disease Brother    Heart disease Mother    Heart disease Father    Cancer Paternal Uncle    Social History   Socioeconomic History    Marital status: Married    Spouse name: Not on file   Number of children: 3   Years of education: Not on file   Highest education level: Not on file  Occupational History   Occupation: retired    Comment: truck driver  Tobacco Use   Smoking status: Former    Packs/day: 2.00    Years: 52.00    Additional pack years: 0.00    Total pack years: 104.00    Types: Cigarettes    Quit date: 02/03/2009    Years since quitting: 14.1   Smokeless tobacco: Never   Tobacco comments:    Counseled to remain smoke free  Vaping Use   Vaping Use: Never used  Substance and Sexual Activity   Alcohol use: Not Currently    Alcohol/week: 0.0 standard drinks of alcohol    Comment: occ   Drug use: No   Sexual activity: Not on file  Other Topics Concern   Not on file  Social History Narrative   Originally from [place]. Previously worked driving a Surveyor, minerals. No pets currently. No mold exposure. 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       wears sunscreen, brushes and flosses daily, see's dentist bi-annually, has smoke/carbon monoxide detectors, wears a seatbelt and practices gun safety      Social Determinants of Health   Financial Resource Strain: Medium Risk (10/31/2022)   Overall Financial Resource Strain (CARDIA)    Difficulty of Paying Living Expenses: Somewhat hard  Food Insecurity: No Food Insecurity (02/20/2023)   Hunger Vital Sign    Worried About Running Out of Food in the Last Year: Never true    Ran Out  of Food in the Last Year: Never true  Transportation Needs: No Transportation Needs (02/20/2023)   PRAPARE - Administrator, Civil Service (Medical): No    Lack of Transportation (Non-Medical): No  Physical Activity: Insufficiently Active (02/15/2022)   Exercise Vital Sign    Days of Exercise per Week: 7 days    Minutes of Exercise per Session: 10 min  Stress: No Stress Concern Present (02/15/2022)   [employer] of Occupational Health - Occupational Stress Questionnaire    Feeling of Stress : Only a little  Social Connections: Socially Integrated (02/15/2022)   Social Connection and Isolation Panel [NHANES]    Frequency of Communication with Friends and Family: More than three times a week    Frequency of Social Gatherings with Friends and Family: More than three times a week    Attends Religious Services: More than 4 times per year    Active Member of [employer] or Organizations: Yes    Attends Engineer, structural: More than 4 times per year    Marital Status: Married    Objective:  BP (!) 124/50   Pulse 80   Temp (!) 96.1 F (35.6 C)   Resp 16   Ht 5\' 6"  (1.676 m)   Wt 151 lb 6.4 oz (68.7 kg)   BMI 24.44 kg/m      03/26/2023   11:01 AM 03/26/2023    8:50 AM 03/19/2023   11:12 AM  BP/Weight  Systolic BP 134 124   Diastolic BP 48 50   Wt. (Lbs) 151 151.4 151  BMI 24.37 kg/m2 24.44 kg/m2 24.37 kg/m2    Physical Exam Vitals reviewed.  Constitutional:      Appearance: Normal appearance. He is normal weight.  Cardiovascular:     Rate and Rhythm: Normal rate and regular rhythm.     Heart sounds: No murmur heard. Pulmonary:     Effort: Pulmonary effort is normal.     Breath sounds: Normal breath sounds.  Abdominal:     General: Abdomen is flat. Bowel sounds are normal.     Palpations: Abdomen is soft.     Tenderness: There is no abdominal tenderness.  Neurological:     Mental Status: He is alert and oriented to person, place, and time.  Psychiatric:        Mood and Affect: Mood normal.        Behavior: Behavior normal.     Diabetic Foot Exam - Simple   No data filed      Lab Results  Component Value Date   WBC 14.0 (H) 03/21/2023   HGB 11.7 (L) 03/21/2023   HCT 37.2 (L) 03/21/2023   PLT 332 03/21/2023   GLUCOSE 140 (H) 03/21/2023   CHOL 167 03/21/2023   TRIG 156 (H) 03/21/2023   HDL 76 03/21/2023   LDLCALC 65 03/21/2023   ALT 42 03/21/2023   AST 27  03/21/2023   NA 141 03/21/2023   K 4.1 03/21/2023   CL 97 03/21/2023   CREATININE 0.81 03/21/2023   BUN 10 03/21/2023   CO2 25 03/21/2023   TSH 0.792 01/25/2023   INR 0.9 01/25/2023   HGBA1C 6.6 (H) 03/21/2023   MICROALBUR Negative 10/09/2021      Assessment & Plan:    DM type 2 with diabetic mixed hyperlipidemia Assessment & Plan: Control: Well controlled. Recommend check sugars fasting daily. Recommend check feet daily. Recommend annual eye exams. Medicines: None Continue to work on eating a healthy diet  and exercise.     Mixed hyperlipidemia Assessment & Plan: Well controlled.  No changes to medicines. Lipitor 40 mg once daily.  Continue to work on eating a healthy diet and exercise.     Gastroesophageal reflux disease, unspecified whether esophagitis present Assessment & Plan: The current medical regimen is effective;  continue present plan and medications. Continue protonix 40 mg twice daily.    GAD (generalized anxiety disorder) Assessment & Plan: Continue zoloft 50 mg once daily and xanax.    Hypertension associated with diabetes Assessment & Plan: Well controlled.  No changes to medicines.  Norvasc 2.5 mg daily, Lasix 40 mg 40 MG TWICE DAILY. Continue to work on eating a healthy diet and exercise.     Chronic respiratory failure with hypoxia Assessment & Plan: ON home oxygen 2 L.   OSA (obstructive sleep apnea) Assessment & Plan: Management per specialist.     Oxygen dependent Assessment & Plan: On 2 L oxygen   COPD GOLD  III  Assessment & Plan: Management per specialist.  Scheduled to see later today (03/26/2023.)      No orders of the defined types were placed in this encounter.   No orders of the defined types were placed in this encounter.    Follow-up: Return in about 3 months (around 06/25/2023) for chronic fasting.   I,Katherina A Bramblett,acting as a scribe for Blane Ohara, MD.,have documented all relevant  documentation on the behalf of Blane Ohara, MD,as directed by  Blane Ohara, MD while in the presence of Blane Ohara, MD.   Clayborn Bigness I Leal-Borjas,acting as a scribe for Blane Ohara, MD.,have documented all relevant documentation on the behalf of Blane Ohara, MD,as directed by  Blane Ohara, MD while in the presence of Blane Ohara, MD.     An After Visit Summary was printed and given to the patient.  Blane Ohara, MD [facility] [phone]

## 2023-03-26 ENCOUNTER — Ambulatory Visit (INDEPENDENT_AMBULATORY_CARE_PROVIDER_SITE_OTHER): Payer: Medicare Other | Admitting: Adult Health

## 2023-03-26 ENCOUNTER — Ambulatory Visit: Payer: Medicare Other | Admitting: Adult Health

## 2023-03-26 ENCOUNTER — Ambulatory Visit (INDEPENDENT_AMBULATORY_CARE_PROVIDER_SITE_OTHER): Payer: Medicare Other | Admitting: Family Medicine

## 2023-03-26 ENCOUNTER — Encounter: Payer: Self-pay | Admitting: Adult Health

## 2023-03-26 VITALS — BP 134/48 | HR 81 | Temp 98.1°F | Ht 66.0 in | Wt 151.0 lb

## 2023-03-26 VITALS — BP 124/50 | HR 80 | Temp 96.1°F | Resp 16 | Ht 66.0 in | Wt 151.4 lb

## 2023-03-26 DIAGNOSIS — I5032 Chronic diastolic (congestive) heart failure: Secondary | ICD-10-CM | POA: Diagnosis not present

## 2023-03-26 DIAGNOSIS — E1159 Type 2 diabetes mellitus with other circulatory complications: Secondary | ICD-10-CM | POA: Diagnosis not present

## 2023-03-26 DIAGNOSIS — G4733 Obstructive sleep apnea (adult) (pediatric): Secondary | ICD-10-CM

## 2023-03-26 DIAGNOSIS — I152 Hypertension secondary to endocrine disorders: Secondary | ICD-10-CM | POA: Diagnosis not present

## 2023-03-26 DIAGNOSIS — K219 Gastro-esophageal reflux disease without esophagitis: Secondary | ICD-10-CM | POA: Diagnosis not present

## 2023-03-26 DIAGNOSIS — E782 Mixed hyperlipidemia: Secondary | ICD-10-CM

## 2023-03-26 DIAGNOSIS — E1169 Type 2 diabetes mellitus with other specified complication: Secondary | ICD-10-CM

## 2023-03-26 DIAGNOSIS — Z9981 Dependence on supplemental oxygen: Secondary | ICD-10-CM | POA: Diagnosis not present

## 2023-03-26 DIAGNOSIS — J9611 Chronic respiratory failure with hypoxia: Secondary | ICD-10-CM

## 2023-03-26 DIAGNOSIS — J449 Chronic obstructive pulmonary disease, unspecified: Secondary | ICD-10-CM

## 2023-03-26 DIAGNOSIS — F411 Generalized anxiety disorder: Secondary | ICD-10-CM | POA: Diagnosis not present

## 2023-03-26 NOTE — Assessment & Plan Note (Signed)
Continue on oxygen to maintain O2 saturations greater than 88 to 90%. 

## 2023-03-26 NOTE — Assessment & Plan Note (Signed)
Mild obstructive sleep apnea.  We reviewed his sleep study results in detail.  Patient is to continue on CPAP with oxygen at bedtime  Plan  Patient Instructions  Continue on BREZTRI 2 puffs Twice daily, rinse after use.  Continue on Prednisone to 10mg  daily Mucinex Twice daily  As needed  Cough/congestion  Flutter valve Three times a day  .  Albuterol Neb or inhaler as needed  Wear Oxygen 2l/m  with activity and At bedtime   to maintain O2 sats >88-90%.  Activity as tolerated.  Aspirations precautions as discussed.  Daily weights Continue on Lasix as directed.  Continue on CPAP At bedtime with Oxygen 2l/m  and with naps.  Please contact office for sooner follow up if symptoms do not improve or worsen or seek emergency care  Follow up with Dr. Melvyn Novas  Or Lakeena Downie NP in 6 weeks and As needed

## 2023-03-26 NOTE — Patient Instructions (Addendum)
Continue on BREZTRI 2 puffs Twice daily, rinse after use.  Continue on Prednisone to 10mg  daily Mucinex Twice daily  As needed  Cough/congestion  Flutter valve Three times a day  .  Albuterol Neb or inhaler as needed  Wear Oxygen 2l/m  with activity and At bedtime   to maintain O2 sats >88-90%.  Activity as tolerated.  Aspirations precautions as discussed.  Daily weights Continue on Lasix as directed.  Continue on CPAP At bedtime with Oxygen 2l/m  and with naps.  Please contact office for sooner follow up if symptoms do not improve or worsen or seek emergency care  Follow up with Dr. Melvyn Novas  Or Leo Fray NP in 6 weeks and As needed

## 2023-03-26 NOTE — Assessment & Plan Note (Signed)
Prone to decompensation.  Currently appears to be euvolemic on exam.  Continue follow-up with cardiology and diuretics.

## 2023-03-26 NOTE — Progress Notes (Signed)
@Patient  ID: Paul [name], male    DOB: 11/27/1946, 77 y.o.   MRN: [id number]  Chief Complaint  Patient presents with   Follow-up    Referring provider: [name], MD  HPI: 77 year old male former smoker quit in 2010 followed for severe COPD with emphysema, steroid-dependent and chronic respiratory failure on oxygen Prone to recurrent COPD exacerbations and hospitalizations Medical history significant for A-fib not on anticoagulation therapy due to here history of severe bleeding secondary to hematuria, balance issues and frequent falls. Intolerant to [employer]. COVID-19 infection January 2022 required hospitalization Hospitalization February 2023 for rhinovirus with COPD exacerbation Hospitalized March 2023 and July 2023 for COPD exacerbation March 2023 outpatient ABG without hypercarbia unable to qualify for nocturnal BiPAP or NIV  TEST/EVENTS :  05/29/16: FVC 2.08 L (86%) FEV1 0.83 L (31%) FEV1/FVC 0.40 FEF 25-75 0.30 L (14%) no bronchodilator response TLC 6.56 L (107%) RV 167% ERV 160% DLCO corrected 60% (hemoglobin 10.9) 07/15/13: FVC 2.68 L (60%) FEV1 1.63 L (53%) FEV1/FVC 0.61 FEF 25-75 0.76 L (26%) 02/04/12: FVC 2.18 L (57%) FEV1 1.09 L (36%) FEV1/FVC 0.50 FEF 25-75 0.33 L (11%)     CT sinus February 2021 no acute sinus disease noted CT chest February 2021 emphysematous changes, several old thoracic compression fractures   2D echo July 2020 EF 60-65%, 2D echo September 13, 2021 EF 60-65%, Watchman device,, grade 3 moderate layered plaque involving the descending aorta   Palliative care started 2021 (nurse comes every 2 weeks)    03/26/2023 Follow up ; COPD, O2 RF  Patient returns for a 2-week follow-up.  Patient was seen last visit after recent recurrent hospitalizations.  Has been admitted for COPD exacerbation, decompensated congestive heart failure acute on chronic respiratory failure, A-fib with RVR.  Patient has been recommended for nocturnal BiPAP or NIV support  unfortunately was unable to qualify in March 2023 as outpatient ABG did not show hypercarbia.  Patient was set up for a sleep study last visit.  In lab study March 11, 2023 showed Mild obstructive sleep apnea with AHI at 13.9 and SpO2 low at 93%.  Required 2 L of oxygen with CPAP at 9 cm H2O.  Patient was recommended to start on nocturnal CPAP.  Patient says he received his CPAP yesterday.  Wore it last night.  Did pretty good he wore it for 9-1/2 hours.  AHI 5.1/hour.  Patient says he did very well. Feels he slept better and not quite as short of breath when he got up.  Feels breathing is doing some better. Remains on Breztri Twice daily  . Prednisone 10mg  daily .  Takes albuterol nebs Twice daily  .  On Oxygen 2l/m with activity . No increased oxygen demands.  Following with cardiology , leg swelling is doing better. On Lasix 40mg  Twice daily .  Wife is with him today.  She has had a very difficult time with recent shoulder surgery with significant perioperative complications.  She is doing better now.   Allergies  Allergen Reactions   Tape Other (See Comments)    PLEASE USE COBAN WRAP IN LIEU OF "TAPE," as it "pulls off the skin!!   Daliresp [Roflumilast] Diarrhea and Nausea And Vomiting   Levaquin [Levofloxacin] Palpitations and Other (See Comments)    Made the B/P fluctuate and heart raced   Alendronate Anxiety and Other (See Comments)    Jittery/nervous    Immunization History  Administered Date(s) Administered   COVID-19, mRNA, vaccine(Comirnaty)12 years and older 10/31/2022  Fluad Quad(high Dose 65+) 10/02/2020, 10/09/2021, 09/05/2022, 10/17/2022   Influenza Split 09/22/2013, 09/01/2015   Influenza Whole 09/04/2011, 09/21/2012   Influenza, High Dose Seasonal PF 09/02/2016, 09/10/2017, 08/31/2018, 09/24/2018, 10/21/2019   Influenza-Unspecified 08/23/2014   Moderna Covid-19 Vaccine Bivalent Booster 28yrs & up 10/09/2021   Moderna SARS-COV2 Booster Vaccination 07/05/2021   Moderna  Sars-Covid-2 Vaccination 01/17/2020, 02/14/2020, 10/06/2020   PNEUMOCOCCAL CONJUGATE-20 12/05/2022   Pneumococcal Conjugate-13 01/11/2014, 12/09/2016   Pneumococcal Polysaccharide-23 08/23/2010, 08/29/2011   Respiratory Syncytial Virus Vaccine,Recomb Aduvanted(Arexvy) 09/23/2022   Tdap 02/01/2019   Zoster Recombinat (Shingrix) 04/04/2014    Past Medical History:  Diagnosis Date   Anemia    Aortic atherosclerosis    Aspiration pneumonia due to inhalation of vomitus 02/03/2022   Atrial fibrillation    Back pain    COPD (chronic obstructive pulmonary disease)    COPD exacerbation 12/27/2021   COVID-19 12/24/2020   Diabetes mellitus without complication    Dizziness 04/08/2022   Elevated PSA    GAD (generalized anxiety disorder)    GERD (gastroesophageal reflux disease)    High cholesterol    Hypertension    Lingular pneumonia 01/05/2021   See cxr 01/06/20 rx with zpak/ cefipime x 5 days and then developed purulent sputum again 01/12/21 so rx levcaquin 500 mg daily x 7 days then return for cxr before more    Lung nodule < 6cm on CT 05/29/2016   Osteoporosis    Oxygen deficiency    Pneumonia    January 2022   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 Quentin Ore with a 20 mm Watchman FLX device   RSV bronchitis 01/27/2022   Vertebral compression fracture 05/29/2019   T8 compression fracture noted on CT scan 05/28/2019 inpatient with chronic steroid dependent COPD - rx calcitonin nasal spray rx per PCP     Tobacco History: Social History   Tobacco Use  Smoking Status Former   Packs/day: 2.00   Years: 52.00   Additional pack years: 0.00   Total pack years: 104.00   Types: Cigarettes   Quit date: 02/03/2009   Years since quitting: 14.1  Smokeless Tobacco Never  Tobacco Comments   Counseled to remain smoke free   Counseling given: Not Answered Tobacco comments: Counseled to remain smoke free   Outpatient Medications Prior to Visit   Medication Sig Dispense Refill   acetaminophen (TYLENOL) 500 MG tablet Take 1,000 mg by mouth at bedtime as needed (pain).     albuterol (PROAIR HFA) 108 (90 Base) MCG/ACT inhaler Inhale 2 puffs into the lungs as needed for wheezing or shortness of breath.     albuterol (PROVENTIL) (2.5 MG/3ML) 0.083% nebulizer solution Take 3 mLs (2.5 mg total) by nebulization every 6 (six) hours as needed for shortness of breath or wheezing. 360 mL 5   ALPRAZolam (XANAX) 0.5 MG tablet Take 1 tablet (0.5 mg total) by mouth 2 (two) times daily as needed for anxiety. 30 tablet 0   amLODipine (NORVASC) 2.5 MG tablet Take 1 tablet (2.5 mg total) by mouth daily. 90 tablet 3   Ascorbic Acid (VITAMIN C) 1000 MG tablet Take 1,000 mg by mouth daily.     aspirin EC 81 MG tablet Take 1 tablet (81 mg total) by mouth daily. Swallow whole. 90 tablet 3   atorvastatin (LIPITOR) 40 MG tablet TAKE 1 TABLET BY MOUTH EVERYDAY AT BEDTIME 90 tablet 1   Budeson-Glycopyrrol-Formoterol (BREZTRI AEROSPHERE) 160-9-4.8 MCG/ACT AERO Inhale 2 puffs into the  lungs 2 (two) times daily. 3 each 3   calcium carbonate (OS-CAL) 600 MG TABS tablet Take 600 mg by mouth daily.     carvedilol (COREG) 3.125 MG tablet Take 1 tablet (3.125 mg total) by mouth 2 (two) times daily with a meal. 180 tablet 3   Cholecalciferol (VITAMIN D3) 50 MCG (2000 UT) TABS Take 2,000 Units by mouth in the morning.     denosumab (PROLIA) 60 MG/ML SOSY injection Inject 60 mg into the skin every 6 (six) months. 180 mL 0   digoxin (LANOXIN) 0.125 MG tablet Take 1 tablet (0.125 mg total) by mouth daily. 30 tablet 2   ferrous sulfate 325 (65 FE) MG tablet Take 325 mg by mouth 2 (two) times daily with a meal.     fluticasone (FLONASE) 50 MCG/ACT nasal spray Place 2 sprays into both nostrils daily as needed for allergies or rhinitis.     furosemide (LASIX) 40 MG tablet Take 1 tablet (40 mg total) by mouth 2 (two) times daily. 60 tablet 2   furosemide (LASIX) 40 MG tablet Take 1  tablet (40 mg total) by mouth daily as needed (Take additional dose of Lasix if weight greater than 2 pounds from previous day or greater than 3 pounds since previous 2 days). 30 tablet 11   loratadine (CLARITIN) 10 MG tablet Take 10 mg by mouth in the morning.     Melatonin 12 MG TABS Take 12 mg by mouth at bedtime.     Multiple Vitamin (MULTIVITAMIN WITH MINERALS) TABS tablet Take 1 tablet by mouth daily with breakfast.     OXYGEN Inhale 2 L/min into the lungs at bedtime.     pantoprazole (PROTONIX) 40 MG tablet Take 1 tablet (40 mg total) by mouth 2 (two) times daily. 180 tablet 3   predniSONE (DELTASONE) 10 MG tablet Take 10 mg by mouth daily with breakfast.     Probiotic Product (PROBIOTIC DAILY) CAPS Take 1 capsule by mouth in the morning.     sertraline (ZOLOFT) 50 MG tablet TAKE 1 TABLET BY MOUTH EVERYDAY AT BEDTIME 90 tablet 1   No facility-administered medications prior to visit.     Review of Systems:   Constitutional:   No  weight loss, night sweats,  Fevers, chills, + fatigue, or  lassitude.  HEENT:   No headaches,  Difficulty swallowing,  Tooth/dental problems, or  Sore throat,                No sneezing, itching, ear ache, nasal congestion, post nasal drip,   CV:  No chest pain,  Orthopnea, PND, swelling in lower extremities, anasarca, dizziness, palpitations, syncope.   GI  No heartburn, indigestion, abdominal pain, nausea, vomiting, diarrhea, change in bowel habits, loss of appetite, bloody stools.   Resp:  No chest wall deformity  Skin: no rash or lesions.  GU: no dysuria, change in color of urine, no urgency or frequency.  No flank pain, no hematuria   MS:  No joint pain or swelling.  No decreased range of motion.  No back pain.    Physical Exam  BP (!) 134/48 (BP Location: Left Arm, Patient Position: Sitting, Cuff Size: Normal)   Pulse 81   Temp 98.1 F (36.7 C) (Oral)   Ht 5\' 6"  (1.676 m)   Wt 151 lb (68.5 kg)   SpO2 93%   BMI 24.37 kg/m   GEN:  A/Ox3; pleasant , NAD, well nourished , elderly    HEENT:  [place]/AT,  NOSE-clear, THROAT-clear, no lesions, no postnasal drip or exudate noted.   NECK:  Supple w/ fair ROM; no JVD; normal carotid impulses w/o bruits; no thyromegaly or nodules palpated; no lymphadenopathy.    RESP decreased breath sounds in the bases no accessory muscle use, no dullness to percussion  CARD:  RRR, no m/r/g, no peripheral edema, pulses intact, no cyanosis or clubbing.  GI:   Soft & nt; nml bowel sounds; no organomegaly or masses detected.   Musco: Warm bil, no deformities or joint swelling noted.   Neuro: alert, no focal deficits noted.    Skin: Warm, no lesions or rashes    Lab Results:    BMET   BNP   ProBNP No results found for: "PROBNP"  Imaging: SLEEP STUDY DOCUMENTS  Result Date: 03/14/2023 Ordered by an unspecified provider.   denosumab (PROLIA) injection 60 mg     Date Action Dose Route User   03/13/2023 0919 Given 60 mg Subcutaneous (Left Arm) Effie Shy, RN          Latest Ref Rng & Units 05/29/2016    9:15 AM  PFT Results  FVC-Pre L 1.92   FVC-Predicted Pre % 52   Pre FEV1/FVC % % 41   FEV1-Pre L 0.78   FEV1-Predicted Pre % 29     No results found for: "NITRICOXIDE"      Assessment & Plan:   COPD GOLD  III  Severe COPD prone to recurrent exacerbations.  Patient is continue on aggressive triple therapy maintenance regimen.  Nebulized bronchodilators.  Continue on chronic steroids holding prednisone 10 mg daily as we go down on lower doses patient has flare of symptoms.  Intolerant to Daliresp in the past.  Unable to get BiPAP or NIV.  Patient now on CPAP for associated sleep apnea  Plan  Patient Instructions  Continue on BREZTRI 2 puffs Twice daily, rinse after use.  Continue on Prednisone to 10mg  daily Mucinex Twice daily  As needed  Cough/congestion  Flutter valve Three times a day  .  Albuterol Neb or inhaler as needed  Wear Oxygen 2l/m  with  activity and At bedtime   to maintain O2 sats >88-90%.  Activity as tolerated.  Aspirations precautions as discussed.  Daily weights Continue on Lasix as directed.  Continue on CPAP At bedtime with Oxygen 2l/m  and with naps.  Please contact office for sooner follow up if symptoms do not improve or worsen or seek emergency care  Follow up with Dr. Melvyn Novas  Or Noah Pelaez NP in 6 weeks and As needed     Chronic respiratory failure with hypoxia ([place]) Continue on oxygen to maintain O2 saturations greater than 88 to 90%.  Chronic diastolic CHF (congestive heart failure) ([facility]) Prone to decompensation.  Currently appears to be euvolemic on exam.  Continue follow-up with cardiology and diuretics.  OSA (obstructive sleep apnea) Mild obstructive sleep apnea.  We reviewed his sleep study results in detail.  Patient is to continue on CPAP with oxygen at bedtime  Plan  Patient Instructions  Continue on BREZTRI 2 puffs Twice daily, rinse after use.  Continue on Prednisone to 10mg  daily Mucinex Twice daily  As needed  Cough/congestion  Flutter valve Three times a day  .  Albuterol Neb or inhaler as needed  Wear Oxygen 2l/m  with activity and At bedtime   to maintain O2 sats >88-90%.  Activity as tolerated.  Aspirations precautions as discussed.  Daily weights Continue on Lasix as directed.  Continue on CPAP At bedtime with Oxygen 2l/m  and with naps.  Please contact office for sooner follow up if symptoms do not improve or worsen or seek emergency care  Follow up with Dr. Melvyn Novas  Or Mical Kicklighter NP in 6 weeks and As needed        Rexene Edison, NP 03/26/2023

## 2023-03-26 NOTE — Assessment & Plan Note (Signed)
Severe COPD prone to recurrent exacerbations.  Patient is continue on aggressive triple therapy maintenance regimen.  Nebulized bronchodilators.  Continue on chronic steroids holding prednisone 10 mg daily as we go down on lower doses patient has flare of symptoms.  Intolerant to Daliresp in the past.  Unable to get BiPAP or NIV.  Patient now on CPAP for associated sleep apnea  Plan  Patient Instructions  Continue on BREZTRI 2 puffs Twice daily, rinse after use.  Continue on Prednisone to 10mg  daily Mucinex Twice daily  As needed  Cough/congestion  Flutter valve Three times a day  .  Albuterol Neb or inhaler as needed  Wear Oxygen 2l/m  with activity and At bedtime   to maintain O2 sats >88-90%.  Activity as tolerated.  Aspirations precautions as discussed.  Daily weights Continue on Lasix as directed.  Continue on CPAP At bedtime with Oxygen 2l/m  and with naps.  Please contact office for sooner follow up if symptoms do not improve or worsen or seek emergency care  Follow up with Dr. Melvyn Novas  Or Jamaris Biernat NP in 6 weeks and As needed

## 2023-03-27 ENCOUNTER — Other Ambulatory Visit: Payer: Self-pay | Admitting: Internal Medicine

## 2023-03-27 ENCOUNTER — Other Ambulatory Visit: Payer: Self-pay | Admitting: Family Medicine

## 2023-03-27 ENCOUNTER — Other Ambulatory Visit: Payer: Self-pay | Admitting: Adult Health

## 2023-03-27 DIAGNOSIS — J209 Acute bronchitis, unspecified: Secondary | ICD-10-CM

## 2023-03-28 ENCOUNTER — Telehealth: Payer: Self-pay | Admitting: Adult Health

## 2023-03-28 MED ORDER — AMOXICILLIN-POT CLAVULANATE 875-125 MG PO TABS: 1.0000 | ORAL_TABLET | Freq: Two times a day (BID) | ORAL | 0 refills | Status: DC

## 2023-03-28 NOTE — Telephone Encounter (Signed)
PT's wife calling. States she & PT was told at the last appt w/Ms. Tammy that if his cough still had color in it Friday to let us know and she would call in an antibiotic.   [phone] is her #   [facility] [place]

## 2023-03-28 NOTE — Telephone Encounter (Signed)
Spoke with patient he complains of productive cough with thick green phlegm and SOB.  Patient was advised at last apt to call in for a antibiotic if cough has not improved.  Tammy can you please advise?  Pharmacy [facility] [place]

## 2023-03-28 NOTE — Telephone Encounter (Signed)
Begin Augmentin 875 mg twice daily for 7 days Please contact office for sooner follow up if symptoms do not improve or worsen or seek emergency care

## 2023-03-28 NOTE — Telephone Encounter (Signed)
Called and spoke with patient. He verbalized understanding. RX has been sent to pharmacy.   Nothing further needed at time of call.  

## 2023-03-29 NOTE — Assessment & Plan Note (Signed)
Continue zoloft 50 mg once daily and xanax.  

## 2023-03-29 NOTE — Assessment & Plan Note (Signed)
The current medical regimen is effective;  continue present plan and medications. Continue protonix 40 mg twice daily.  

## 2023-03-29 NOTE — Assessment & Plan Note (Addendum)
Well controlled.  No changes to medicines.  Norvasc 2.5 mg daily, Lasix 40 mg 40 MG TWICE DAILY. Continue to work on eating a healthy diet and exercise.

## 2023-03-29 NOTE — Assessment & Plan Note (Signed)
Well controlled.  No changes to medicines. Lipitor 40 mg once daily.  Continue to work on eating a healthy diet and exercise.

## 2023-03-29 NOTE — Assessment & Plan Note (Signed)
Control: Well controlled. Recommend check sugars fasting daily. Recommend check feet daily. Recommend annual eye exams. Medicines: None Continue to work on eating a healthy diet and exercise.

## 2023-03-30 ENCOUNTER — Encounter: Payer: Self-pay | Admitting: Family Medicine

## 2023-03-30 NOTE — Assessment & Plan Note (Signed)
Management per specialist. 

## 2023-03-30 NOTE — Assessment & Plan Note (Signed)
ON home oxygen 2 L.

## 2023-03-30 NOTE — Assessment & Plan Note (Signed)
Management per specialist.  Scheduled to see later today (03/26/2023.)

## 2023-03-30 NOTE — Assessment & Plan Note (Signed)
On 2 L oxygen. ?

## 2023-03-31 NOTE — Telephone Encounter (Signed)
Ok refill prednisone 10 mg.#90 with no refills.  Last OV 03/26/2023 with Rubye Oaks, NP.    Has OV with Dr. Sherene Sires on 05/07/2023.

## 2023-04-01 ENCOUNTER — Ambulatory Visit (INDEPENDENT_AMBULATORY_CARE_PROVIDER_SITE_OTHER): Payer: Medicare Other

## 2023-04-01 DIAGNOSIS — Z0000 Encounter for general adult medical examination without abnormal findings: Secondary | ICD-10-CM | POA: Diagnosis not present

## 2023-04-01 NOTE — Progress Notes (Signed)
Subjective:   Paul [name] is a 77 y.o. male who presents for Medicare Annual/Subsequent preventive examination. I connected with  Paul [name] on 04/01/23 by a audio enabled telemedicine application and verified that I am speaking with the correct person using two identifiers.  Patient Location: Home  Provider Location: Office/Clinic  I discussed the limitations of evaluation and management by telemedicine. The patient expressed understanding and agreed to proceed.  Cardiac Risk Factors include: advanced age (>[id number], 8>65 women);diabetes mellitus;male gender     Objective:    Today's Vitals   04/01/23 1132  PainSc: 0-No pain   There is no height or weight on file to calculate BMI.     03/11/2023    8:19 PM 02/23/2023    1:50 PM 02/19/2023   11:00 AM 02/15/2023    1:23 PM 02/04/2023    6:18 PM 01/25/2023    9:13 PM 01/16/2023   11:16 AM  Advanced Directives  Does Patient Have a Medical Advance Directive? Yes No  Yes Yes Yes Yes  Type of Estate agentAdvance Directive Healthcare Power of [place];Living will  Healthcare Power of [place];Living will Out of facility DNR (pink MOST or yellow form) Out of facility DNR (pink MOST or yellow form) Out of facility DNR (pink MOST or yellow form) Healthcare Power of [place]orney;Living will  Does patient want to make changes to medical advance directive? No - Patient declined  No - Guardian declined  No - Patient declined No - Patient declined No - Patient declined  Copy of Healthcare Power of Attorney in Chart? Yes - validated most recent copy scanned in chart (See row information)  Yes - validated most recent copy scanned in chart (See row information)  No - copy requested  No - copy requested    Current Medications (verified) Outpatient Encounter Medications as of 04/01/2023  Medication Sig   acetaminophen (TYLENOL) 500 MG tablet Take 1,000 mg by mouth at bedtime as needed (pain).   albuterol (PROAIR HFA) 108 (90 Base) MCG/ACT inhaler Inhale 2 puffs  into the lungs as needed for wheezing or shortness of breath.   albuterol (PROVENTIL) (2.5 MG/3ML) 0.083% nebulizer solution Take 3 mLs (2.5 mg total) by nebulization every 6 (six) hours as needed for shortness of breath or wheezing.   ALPRAZolam (XANAX) 0.5 MG tablet Take 1 tablet (0.5 mg total) by mouth 2 (two) times daily as needed for anxiety.   amLODipine (NORVASC) 2.5 MG tablet Take 1 tablet (2.5 mg total) by mouth daily.   amoxicillin-clavulanate (AUGMENTIN) 875-125 MG tablet Take 1 tablet by mouth 2 (two) times daily.   Ascorbic Acid (VITAMIN C) 1000 MG tablet Take 1,000 mg by mouth daily.   aspirin EC 81 MG tablet Take 1 tablet (81 mg total) by mouth daily. Swallow whole.   atorvastatin (LIPITOR) 40 MG tablet TAKE 1 TABLET BY MOUTH EVERYDAY AT BEDTIME   Budeson-Glycopyrrol-Formoterol (BREZTRI AEROSPHERE) 160-9-4.8 MCG/ACT AERO Inhale 2 puffs into the lungs 2 (two) times daily.   calcium carbonate (OS-CAL) 600 MG TABS tablet Take 600 mg by mouth daily.   carvedilol (COREG) 3.125 MG tablet Take 1 tablet (3.125 mg total) by mouth 2 (two) times daily with a meal.   Cholecalciferol (VITAMIN D3) 50 MCG (2000 UT) TABS Take 2,000 Units by mouth in the morning.   denosumab (PROLIA) 60 MG/ML SOSY injection Inject 60 mg into the skin every 6 (six) months.   digoxin (LANOXIN) 0.125 MG tablet Take 1 tablet (0.125 mg total) by mouth daily.  ferrous sulfate 325 (65 FE) MG tablet Take 325 mg by mouth 2 (two) times daily with a meal.   fluticasone (FLONASE) 50 MCG/ACT nasal spray Place 2 sprays into both nostrils daily as needed for allergies or rhinitis.   furosemide (LASIX) 40 MG tablet Take 1 tablet (40 mg total) by mouth 2 (two) times daily.   furosemide (LASIX) 40 MG tablet Take 1 tablet (40 mg total) by mouth daily as needed (Take additional dose of Lasix if weight greater than 2 pounds from previous day or greater than 3 pounds since previous 2 days).   loratadine (CLARITIN) 10 MG tablet Take 10 mg  by mouth in the morning.   Melatonin 12 MG TABS Take 12 mg by mouth at bedtime.   Multiple Vitamin (MULTIVITAMIN WITH MINERALS) TABS tablet Take 1 tablet by mouth daily with breakfast.   OXYGEN Inhale 2 L/min into the lungs at bedtime.   pantoprazole (PROTONIX) 40 MG tablet Take 1 tablet (40 mg total) by mouth 2 (two) times daily.   predniSONE (DELTASONE) 10 MG tablet TAKE 1 TABLET (10 MG TOTAL) BY MOUTH DAILY WITH BREAKFAST.   Probiotic Product (PROBIOTIC DAILY) CAPS Take 1 capsule by mouth in the morning.   sertraline (ZOLOFT) 50 MG tablet TAKE 1 TABLET BY MOUTH EVERYDAY AT BEDTIME   No facility-administered encounter medications on file as of 04/01/2023.    Allergies (verified) Tape, Daliresp [roflumilast], Levaquin [levofloxacin], and Alendronate   History: Past Medical History:  Diagnosis Date   Anemia    Aortic atherosclerosis    Aspiration pneumonia due to inhalation of vomitus 02/03/2022   Atrial fibrillation    Back pain    COPD (chronic obstructive pulmonary disease)    COPD exacerbation 12/27/2021   COVID-19 12/24/2020   Diabetes mellitus without complication    Dizziness 04/08/2022   Elevated PSA    GAD (generalized anxiety disorder)    GERD (gastroesophageal reflux disease)    High cholesterol    Hypertension    Lingular pneumonia 01/05/2021   See cxr 01/06/20 rx with zpak/ cefipime x 5 days and then developed purulent sputum again 01/12/21 so rx levcaquin 500 mg daily x 7 days then return for cxr before more    Lung nodule < 6cm on CT 05/29/2016   Osteoporosis    Oxygen deficiency    Pneumonia    January 2022   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 Lalla Brothers with a 20 mm Watchman FLX device   RSV bronchitis 01/27/2022   Vertebral compression fracture 05/29/2019   T8 compression fracture noted on CT scan 05/28/2019 inpatient with chronic steroid dependent COPD - rx calcitonin nasal spray rx per PCP    Past Surgical  History:  Procedure Laterality Date   CATARACT EXTRACTION Right    ELBOW SURGERY  07/2022   surgery on olecranon bursa. Dr. Yehuda Budd   IR KYPHO EA ADDL LEVEL THORACIC OR LUMBAR  11/28/2021   LEFT ATRIAL APPENDAGE OCCLUSION N/A 08/02/2021   Procedure: LEFT ATRIAL APPENDAGE OCCLUSION;  Surgeon: Tonny Bollman, MD;  Location: [facility] INVASIVE CV LAB;  Service: Cardiovascular;  Laterality: N/A;   SKIN SURGERY     shoulders and chest   TEE WITHOUT CARDIOVERSION N/A 08/02/2021   Procedure: TRANSESOPHAGEAL ECHOCARDIOGRAM (TEE);  Surgeon: Tonny Bollman, MD;  Location: [facility] INVASIVE CV LAB;  Service: Cardiovascular;  Laterality: N/A;   TEE WITHOUT CARDIOVERSION N/A 09/13/2021   Procedure: TRANSESOPHAGEAL ECHOCARDIOGRAM (TEE);  Surgeon: Thurmon Fair,  MD;  Location: [facility] ENDOSCOPY;  Service: Cardiovascular;  Laterality: N/A;   Family History  Problem Relation Age of Onset   Heart disease Brother    Heart disease Mother    Heart disease Father    Cancer Paternal Uncle    Social History   Socioeconomic History   Marital status: Married    Spouse name: Not on file   Number of children: 3   Years of education: Not on file   Highest education level: Not on file  Occupational History   Occupation: retired    Comment: truck driver  Tobacco Use   Smoking status: Former    Packs/day: 2.00    Years: 52.00    Additional pack years: 0.00    Total pack years: 104.00    Types: Cigarettes    Quit date: 02/03/2009    Years since quitting: 14.1   Smokeless tobacco: Never   Tobacco comments:    Counseled to remain smoke free  Vaping Use   Vaping Use: Never used  Substance and Sexual Activity   Alcohol use: Not Currently    Alcohol/week: 0.0 standard drinks of alcohol    Comment: occ   Drug use: No   Sexual activity: Not on file  Other Topics Concern   Not on file  Social History Narrative   Originally from [place]. Previously worked driving a Surveyor, minerals. No pets currently. No mold exposure.  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       wears sunscreen, brushes and flosses daily, see's dentist bi-annually, has smoke/carbon monoxide detectors, wears a seatbelt and practices gun safety      Social Determinants of Health   Financial Resource Strain: Low Risk  (04/01/2023)   Overall Financial Resource Strain (CARDIA)    Difficulty of Paying Living Expenses: Not very hard  Food Insecurity: No Food Insecurity (02/20/2023)   Hunger Vital Sign    Worried About Running Out of Food in the Last Year: Never true    Ran Out of Food in the Last Year: Never true  Transportation Needs: No Transportation Needs (02/20/2023)   PRAPARE - Administrator, Civil Service (Medical): No    Lack of Transportation (Non-Medical): No  Physical Activity: Insufficiently Active (04/01/2023)   Exercise Vital Sign    Days of Exercise per Week: 3 days    Minutes of Exercise per Session: 30 min  Stress: No Stress Concern Present (02/15/2022)   [employer] of Occupational Health - Occupational Stress Questionnaire    Feeling of Stress : Only a little  Social Connections: Socially Integrated (02/15/2022)   Social Connection and Isolation Panel [NHANES]    Frequency of Communication with Friends and Family: More than three times a week    Frequency of Social Gatherings with Friends and Family: More than three times a week    Attends Religious Services: More than 4 times per year    Active Member of [employer] or Organizations: Yes    Attends Engineer, structural: More than 4 times per year    Marital Status: Married    Tobacco Counseling Counseling given: Not Answered Tobacco comments: Counseled to remain smoke free   Clinical Intake:  Pre-visit preparation completed: Yes Pain : No/denies pain Pain Score: 0-No pain   BMI - recorded: 24.37 Nutritional Status: BMI of 19-24  Normal Nutritional Risks: None Diabetes: Yes (most  recent A1C 6.6) CBG done?:  No Did pt. bring in CBG monitor from home?: No How often do you need to have someone help you when you read instructions, pamphlets, or other written materials from your doctor or pharmacy?: 1 - Never Interpreter Needed?: No     Activities of Daily Living    04/01/2023   11:15 AM 02/19/2023   11:00 AM  In your present state of health, do you have any difficulty performing the following activities:  Hearing? 0 0  Vision? 0 0  Difficulty concentrating or making decisions? 0 0  Walking or climbing stairs? 0 1  Dressing or bathing? 0 0  Doing errands, shopping? 0 0  Preparing Food and eating ? N   Using the Toilet? N   In the past six months, have you accidently leaked urine? N   Do you have problems with loss of bowel control? N   Managing your Medications? N   Managing your Finances? N   Housekeeping or managing your Housekeeping? N     Patient Care Team: Blane Ohara, MD as PCP - General (Family Medicine) Jake Bathe, MD as PCP - Cardiology (Cardiology) Lanier Prude, MD as PCP - Electrophysiology (Cardiology) Storm Frisk, MD as Attending Physician (Pulmonary Disease) Nyoka Cowden, MD as Consulting Physician (Pulmonary Disease) [place] as Registered Nurse [facility] and Palliative Medicine) Zettie Pho, [facility] (Pharmacist) Rockne Menghini, RN as [facility] Care Management     Assessment:   This is a routine wellness examination for Jas.  Hearing/Vision screen No results found.  Dietary issues and exercise activities discussed: Current Exercise Habits: Home exercise routine, Type of exercise: walking, Time (Minutes): 30, Frequency (Times/Week): 3, Weekly Exercise (Minutes/Week): 90, Intensity: Mild, Exercise limited by: respiratory conditions(s)  Depression Screen    04/01/2023   11:15 AM 03/26/2023    8:54 AM 11/22/2022    9:35 AM 08/16/2022    8:20 AM 02/15/2022    8:33 AM 01/14/2022   11:14 AM  12/11/2021    1:11 PM  PHQ 2/9 Scores  PHQ - 2 Score 0 0 0 0 0 0 0    Fall Risk    04/01/2023   11:02 AM 11/22/2022    9:34 AM 08/16/2022    8:20 AM 05/13/2022    4:22 PM 02/15/2022    8:55 AM  Fall Risk   Falls in the past year? 0 0 1 1   Number falls in past yr: 0 0 0 1 0  Injury with Fall? 0 0 0 1 0  Risk for fall due to : No Fall Risks History of fall(s) History of fall(s)  History of fall(s)  Follow up Falls evaluation completed;Education provided Falls evaluation completed Falls evaluation completed Falls evaluation completed Falls evaluation completed;Falls prevention discussed    FALL RISK PREVENTION PERTAINING TO THE HOME:  Any stairs in or around the home? No  If so, are there any without handrails? No  Home free of loose throw rugs in walkways, pet beds, electrical cords, etc? Yes  Adequate lighting in your home to reduce risk of falls? Yes   ASSISTIVE DEVICES UTILIZED TO PREVENT FALLS:  Life alert? No  Use of a cane, walker or w/c? No  Grab bars in the bathroom? No  Shower chair or bench in shower? No  Elevated toilet seat or a handicapped toilet? No   TIMED UP AND GO:  Was the test performed? No .   Cognitive Function:  04/01/2023   11:23 AM  6CIT Screen  What Year? 0 points  What month? 0 points  What time? 0 points  Count back from 20 0 points  Months in reverse 0 points  Repeat phrase 0 points  Total Score 0 points    Immunizations Immunization History  Administered Date(s) Administered   COVID-19, mRNA, vaccine(Comirnaty)12 years and older 10/31/2022   Fluad Quad(high Dose 65+) 10/02/2020, 10/09/2021, 09/05/2022, 10/17/2022   Influenza Split 09/22/2013, 09/01/2015   Influenza Whole 09/04/2011, 09/21/2012   Influenza, High Dose Seasonal PF 09/02/2016, 09/10/2017, 08/31/2018, 09/24/2018, 10/21/2019   Influenza-Unspecified 08/23/2014   Moderna Covid-19 Vaccine Bivalent Booster 6yrs & up 10/09/2021   Moderna SARS-COV2 Booster Vaccination  07/05/2021   Moderna Sars-Covid-2 Vaccination 01/17/2020, 02/14/2020, 10/06/2020   PNEUMOCOCCAL CONJUGATE-20 12/05/2022   Pneumococcal Conjugate-13 01/11/2014, 12/09/2016   Pneumococcal Polysaccharide-23 08/23/2010, 08/29/2011   Respiratory Syncytial Virus Vaccine,Recomb Aduvanted(Arexvy) 09/23/2022   Tdap 02/01/2019   Zoster Recombinat (Shingrix) 04/04/2014    TDAP status: Up to date  Flu Vaccine status: Up to date  Pneumococcal vaccine status: Up to date  Covid-19 vaccine status: Completed vaccines  Qualifies for Shingles Vaccine? Yes   Zostavax completed Yes   Shingrix Completed?: No.    Education has been provided regarding the importance of this vaccine. Patient has been advised to call insurance company to determine out of pocket expense if they have not yet received this vaccine. Advised may also receive vaccine at local pharmacy or Health Dept. Verbalized acceptance and understanding.  Screening Tests Health Maintenance  Topic Date Due   DEXA SCAN  Never done   OPHTHALMOLOGY EXAM  Never done   Hepatitis C Screening  Never done   Zoster Vaccines- Shingrix (2 of 2) 05/30/2014   COVID-19 Vaccine (6 - 2023-24 season) 12/26/2022   INFLUENZA VACCINE  07/24/2023   Diabetic kidney evaluation - Urine ACR  08/17/2023   HEMOGLOBIN A1C  09/21/2023   FOOT EXAM  11/23/2023   Lung Cancer Screening  02/05/2024   Diabetic kidney evaluation - eGFR measurement  03/20/2024   Medicare Annual Wellness (AWV)  03/31/2024   DTaP/Tdap/Td (2 - Td or Tdap) 02/01/2029   Pneumonia Vaccine 78+ Years old  Completed   HPV VACCINES  Aged Out   COLONOSCOPY (Pts 45-37yrs Insurance coverage will need to be confirmed)  Discontinued    Health Maintenance  Health Maintenance Due  Topic Date Due   DEXA SCAN  Never done   OPHTHALMOLOGY EXAM  Never done   Hepatitis C Screening  Never done   Zoster Vaccines- Shingrix (2 of 2) 05/30/2014   COVID-19 Vaccine (6 - 2023-24 season) 12/26/2022     Colorectal cancer screening: No longer required.   Lung Cancer Screening: (Low Dose CT Chest recommended if Age 3-80 years, 30 pack-year currently smoking OR have quit w/in 15years.) does not qualify.   Lung Cancer Screening Referral: N/A  Additional Screening:  Vision Screening: Recommended annual ophthalmology exams for early detection of glaucoma and other disorders of the eye. Is the patient up to date with their annual eye exam?  Yes  Who is the provider or what is the name of the office in which the patient attends annual eye exams? In [facility] off [address] - will get me the name of it  Dental Screening: Recommended annual dental exams for proper oral hygiene  Community Resource Referral / Chronic Care Management: CRR required this visit?  No   CCM required this visit?  No  Plan:    I have personally reviewed and noted the following in the patient's chart:   Medical and social history Use of alcohol, tobacco or illicit drugs  Current medications and supplements including opioid prescriptions.  Functional ability and status Nutritional status Physical activity Advanced directives List of other physicians Hospitalizations, surgeries, and ER visits in previous 12 months Vitals Screenings to include cognitive, depression, and falls Referrals and appointments  In addition, I have reviewed and discussed with patient certain preventive protocols, quality metrics, and best practice recommendations. A written personalized care plan for preventive services as well as general preventive health recommendations were provided to patient.     Jacklynn Bue, LPN   07/29/5783

## 2023-04-01 NOTE — Patient Instructions (Addendum)
[name] , Thank you for taking time to come for your Medicare Wellness Visit. I appreciate your ongoing commitment to your health goals. Please review the following plan we discussed and let me know if I can assist you in the future.   Screening recommendations/referrals: Recommended yearly ophthalmology/optometry visit for glaucoma screening and checkup  Please call back with the name of the office or Dr where you got your last eye exam Recommended yearly dental visit for hygiene and checkup  Vaccinations: Influenza vaccine: Due yearly in the fall Pneumococcal vaccine: up-to-date Tdap vaccine: Due 2030 Shingles vaccine: no documentation of Shingrix -- this vaccine is available at the pharmacy    Advanced directives: on file  Preventive Care 65 Years and Older, Male Preventive care refers to lifestyle choices and visits with your health care provider that can promote health and wellness. What does preventive care include? A yearly physical exam. This is also called an annual well check. Dental exams once or twice a year. Routine eye exams. Ask your health care provider how often you should have your eyes checked. Personal lifestyle choices, including: Daily care of your teeth and gums. Regular physical activity. Eating a healthy diet. Avoiding tobacco and drug use. Limiting alcohol use. Practicing safe sex. Taking low doses of aspirin every day. Taking vitamin and mineral supplements as recommended by your health care provider. What happens during an annual well check? The services and screenings done by your health care provider during your annual well check will depend on your age, overall health, lifestyle risk factors, and family history of disease. Counseling  Your health care provider may ask you questions about your: Alcohol use. Tobacco use. Drug use. Emotional well-being. Home and relationship well-being. Sexual activity. Eating habits. History of falls. Memory  and ability to understand (cognition). Work and work Astronomer. Screening  You may have the following tests or measurements: Height, weight, and BMI. Blood pressure. Lipid and cholesterol levels. These may be checked every 5 years, or more frequently if you are over 71 years old. Skin check. Lung cancer screening. You may have this screening every year starting at age 83 if you have a 30-pack-year history of smoking and currently smoke or have quit within the past 15 years. Fecal occult blood test (FOBT) of the stool. You may have this test every year starting at age 65. Flexible sigmoidoscopy or colonoscopy. You may have a sigmoidoscopy every 5 years or a colonoscopy every 10 years starting at age 36. Prostate cancer screening. Recommendations will vary depending on your family history and other risks. Hepatitis C blood test. Hepatitis B blood test. Sexually transmitted disease (STD) testing. Diabetes screening. This is done by checking your blood sugar (glucose) after you have not eaten for a while (fasting). You may have this done every 1-3 years. Abdominal aortic aneurysm (AAA) screening. You may need this if you are a current or former smoker. Osteoporosis. You may be screened starting at age 33 if you are at high risk. Talk with your health care provider about your test results, treatment options, and if necessary, the need for more tests. Vaccines  Your health care provider may recommend certain vaccines, such as: Influenza vaccine. This is recommended every year. Tetanus, diphtheria, and acellular pertussis (Tdap, Td) vaccine. You may need a Td booster every 10 years. Zoster vaccine. You may need this after age 34. Pneumococcal 13-valent conjugate (PCV13) vaccine. One dose is recommended after age 79. Pneumococcal polysaccharide (PPSV23) vaccine. One dose is recommended after  age 66. Talk to your health care provider about which screenings and vaccines you need and how often you  need them. This information is not intended to replace advice given to you by your health care provider. Make sure you discuss any questions you have with your health care provider. Document Released: 01/05/2016 Document Revised: 08/28/2016 Document Reviewed: 10/10/2015 Elsevier Interactive Patient Education  2017 [place] Prevention in the Home Falls can cause injuries. They can happen to people of all ages. There are many things you can do to make your home safe and to help prevent falls. What can I do on the outside of my home? Regularly fix the edges of walkways and driveways and fix any cracks. Remove anything that might make you trip as you walk through a door, such as a raised step or threshold. Trim any bushes or trees on the path to your home. Use bright outdoor lighting. Clear any walking paths of anything that might make someone trip, such as rocks or tools. Regularly check to see if handrails are loose or broken. Make sure that both sides of any steps have handrails. Any raised decks and porches should have guardrails on the edges. Have any leaves, snow, or ice cleared regularly. Use sand or salt on walking paths during winter. Clean up any spills in your garage right away. This includes oil or grease spills. What can I do in the bathroom? Use night lights. Install grab bars by the toilet and in the tub and shower. Do not use towel bars as grab bars. Use non-skid mats or decals in the tub or shower. If you need to sit down in the shower, use a plastic, non-slip stool. Keep the floor dry. Clean up any water that spills on the floor as soon as it happens. Remove soap buildup in the tub or shower regularly. Attach bath mats securely with double-sided non-slip rug tape. Do not have throw rugs and other things on the floor that can make you trip. What can I do in the bedroom? Use night lights. Make sure that you have a light by your bed that is easy to reach. Do not  use any sheets or blankets that are too big for your bed. They should not hang down onto the floor. Have a firm chair that has side arms. You can use this for support while you get dressed. Do not have throw rugs and other things on the floor that can make you trip. What can I do in the kitchen? Clean up any spills right away. Avoid walking on wet floors. Keep items that you use a lot in easy-to-reach places. If you need to reach something above you, use a strong step stool that has a grab bar. Keep electrical cords out of the way. Do not use floor polish or wax that makes floors slippery. If you must use wax, use non-skid floor wax. Do not have throw rugs and other things on the floor that can make you trip. What can I do with my stairs? Do not leave any items on the stairs. Make sure that there are handrails on both sides of the stairs and use them. Fix handrails that are broken or loose. Make sure that handrails are as long as the stairways. Check any carpeting to make sure that it is firmly attached to the stairs. Fix any carpet that is loose or worn. Avoid having throw rugs at the top or bottom of the stairs. If you do  have throw rugs, attach them to the floor with carpet tape. Make sure that you have a light switch at the top of the stairs and the bottom of the stairs. If you do not have them, ask someone to add them for you. What else can I do to help prevent falls? Wear shoes that: Do not have high heels. Have rubber bottoms. Are comfortable and fit you well. Are closed at the toe. Do not wear sandals. If you use a stepladder: Make sure that it is fully opened. Do not climb a closed stepladder. Make sure that both sides of the stepladder are locked into place. Ask someone to hold it for you, if possible. Clearly mark and make sure that you can see: Any grab bars or handrails. First and last steps. Where the edge of each step is. Use tools that help you move around (mobility  aids) if they are needed. These include: Canes. Walkers. Scooters. Crutches. Turn on the lights when you go into a dark area. Replace any light bulbs as soon as they burn out. Set up your furniture so you have a clear path. Avoid moving your furniture around. If any of your floors are uneven, fix them. If there are any pets around you, be aware of where they are. Review your medicines with your doctor. Some medicines can make you feel dizzy. This can increase your chance of falling. Ask your doctor what other things that you can do to help prevent falls. This information is not intended to replace advice given to you by your health care provider. Make sure you discuss any questions you have with your health care provider. Document Released: 10/05/2009 Document Revised: 05/16/2016 Document Reviewed: 01/13/2015 Elsevier Interactive Patient Education  2017 [employer].

## 2023-04-02 ENCOUNTER — Ambulatory Visit: Payer: Medicare Other | Attending: Physician Assistant | Admitting: Cardiology

## 2023-04-02 ENCOUNTER — Encounter: Payer: Self-pay | Admitting: Cardiology

## 2023-04-02 VITALS — BP 130/52 | HR 82 | Ht 66.0 in | Wt 152.8 lb

## 2023-04-02 DIAGNOSIS — E1159 Type 2 diabetes mellitus with other circulatory complications: Secondary | ICD-10-CM | POA: Diagnosis not present

## 2023-04-02 DIAGNOSIS — I2510 Atherosclerotic heart disease of native coronary artery without angina pectoris: Secondary | ICD-10-CM | POA: Diagnosis not present

## 2023-04-02 DIAGNOSIS — I480 Paroxysmal atrial fibrillation: Secondary | ICD-10-CM

## 2023-04-02 DIAGNOSIS — G4733 Obstructive sleep apnea (adult) (pediatric): Secondary | ICD-10-CM | POA: Diagnosis not present

## 2023-04-02 DIAGNOSIS — J441 Chronic obstructive pulmonary disease with (acute) exacerbation: Secondary | ICD-10-CM | POA: Insufficient documentation

## 2023-04-02 DIAGNOSIS — E1169 Type 2 diabetes mellitus with other specified complication: Secondary | ICD-10-CM | POA: Diagnosis not present

## 2023-04-02 DIAGNOSIS — I2584 Coronary atherosclerosis due to calcified coronary lesion: Secondary | ICD-10-CM | POA: Diagnosis not present

## 2023-04-02 DIAGNOSIS — E782 Mixed hyperlipidemia: Secondary | ICD-10-CM | POA: Diagnosis not present

## 2023-04-02 DIAGNOSIS — I5032 Chronic diastolic (congestive) heart failure: Secondary | ICD-10-CM | POA: Insufficient documentation

## 2023-04-02 DIAGNOSIS — D6869 Other thrombophilia: Secondary | ICD-10-CM | POA: Diagnosis not present

## 2023-04-02 DIAGNOSIS — I152 Hypertension secondary to endocrine disorders: Secondary | ICD-10-CM | POA: Diagnosis not present

## 2023-04-02 NOTE — Assessment & Plan Note (Addendum)
Patient with very stable HFpEF since last ED visit in March. It sounds like he's made major changes to salt intake with positive results. Given DM, inquired with pharmacy team about SGLT2 pricing but with patient's current medicare part D plan, no co-pay and he would bear most of cost.   Continue Lasix 40mg  QD with PRN extra dose for weight gain greater than 3 pounds or lower extremity edema. Renal function stable on 3/29 labs. Continue Coreg 3.125mg  BID With orthostatic symptoms, will defer initiating MRA Continue low salt diet with fluid intake restricted to <70 oz.

## 2023-04-02 NOTE — Assessment & Plan Note (Addendum)
A1c 6.6% as of 3/29. Dietary management per PCP. As above, would potentially consider initiating SGLT2 if patient qualified for cost assistance and/or changes his medicare part D plan next year.

## 2023-04-02 NOTE — Assessment & Plan Note (Signed)
Patient's COPD appears well controlled at this time. Reassuring physical exam. Continue management per pulmonology.

## 2023-04-02 NOTE — Progress Notes (Signed)
Cardiology Office Note:    Date:  04/02/2023   ID:  Paul [name], DOB 10/13/1946, MRN [id number]  PCP:  Blane Ohara, MD  [facility] Providers Cardiologist:  Donato Schultz, MD Electrophysiologist:  Lanier Prude, MD    Referring MD: Blane Ohara, MD   Chief Complaint:  Congestive Heart Failure    Patient Profile:  Paroxysmal atrial fibrillation On Coreg 3.125, Digoxin 0.125mg  S/P Watchman placement 08/02/2021 Chronic HFpEF:  LVEF 65-70% on 01/26/23 TTE, grade I diastolic dysfunction CAD Moderate three vessel CAD CAC score of 1046 on 07/17/21 Cardiac CT Hypertension COPD Gold III Dizziness  Cardiac Studies & Procedures     STRESS TESTS  MYOCARDIAL PERFUSION IMAGING 06/28/2019  Narrative  Nuclear stress EF: 69%.  Electrically negative for ischemia  Normal perfusion  This is a low risk study.   ECHOCARDIOGRAM  ECHOCARDIOGRAM COMPLETE 01/26/2023  Narrative ECHOCARDIOGRAM REPORT    Patient Name:   Paul [name] Date of Exam: 01/26/2023 Medical Rec #:  [id number]      Height:       66.0 in Accession #:    [id number]     Weight:       161.6 lb Date of Birth:  1946/11/03       BSA:          1.827 m Patient Age:    76 years       BP:           120/50 mmHg Patient Gender: M              HR:           84 bpm. Exam Location:  Inpatient  Procedure: 2D Echo, Cardiac Doppler, Color Doppler and Intracardiac Opacification Agent  Indications:    Elevated troponin  History:        Patient has prior history of Echocardiogram examinations, most recent 09/13/2021. COPD, Arrythmias:Atrial Fibrillation; Risk Factors:Diabetes and Hypertension. 20mm Watchman Device implanted 08/02/21.  Sonographer:    Ross Ludwig RDCS (AE) Referring Phys: Therisa Doyne   Sonographer Comments: Technically difficult study due to poor echo windows. IMPRESSIONS   1. Left ventricular ejection fraction, by estimation, is 65 to 70%. The left ventricle has normal function. The left  ventricle has no regional wall motion abnormalities. Left ventricular diastolic parameters are consistent with Grade I diastolic dysfunction (impaired relaxation). 2. Right ventricular systolic function is low normal. The right ventricular size is normal. The estimated right ventricular systolic pressure is 19.6 mmHg. 3. The mitral valve is grossly normal. Trivial mitral valve regurgitation. 4. The aortic valve is tricuspid. Aortic valve regurgitation is trivial. Aortic valve sclerosis is present, with no evidence of aortic valve stenosis. 5. The inferior vena cava is normal in size with greater than 50% respiratory variability, suggesting right atrial pressure of 3 mmHg.  Comparison(s): Changes from prior study are noted. 09/13/2021: LVEF 60-65%.  FINDINGS Left Ventricle: Left ventricular ejection fraction, by estimation, is 65 to 70%. The left ventricle has normal function. The left ventricle has no regional wall motion abnormalities. Definity contrast agent was given IV to delineate the left ventricular endocardial borders. The left ventricular internal cavity size was normal in size. There is no left ventricular hypertrophy. Left ventricular diastolic parameters are consistent with Grade I diastolic dysfunction (impaired relaxation). Indeterminate filling pressures.  Right Ventricle: The right ventricular size is normal. No increase in right ventricular wall thickness. Right ventricular systolic function is low normal. The tricuspid regurgitant velocity is 2.04  m/s, and with an assumed right atrial pressure of 3 mmHg, the estimated right ventricular systolic pressure is 19.6 mmHg.  Left Atrium: Left atrial size was normal in size.  Right Atrium: Right atrial size was normal in size.  Pericardium: There is no evidence of pericardial effusion.  Mitral Valve: The mitral valve is grossly normal. Trivial mitral valve regurgitation.  Tricuspid Valve: The tricuspid valve is grossly normal.  Tricuspid valve regurgitation is trivial.  Aortic Valve: The aortic valve is tricuspid. Aortic valve regurgitation is trivial. Aortic valve sclerosis is present, with no evidence of aortic valve stenosis. Aortic valve mean gradient measures 1.0 mmHg. Aortic valve peak gradient measures 2.6 mmHg. Aortic valve area, by VTI measures 2.83 cm.  Pulmonic Valve: The pulmonic valve was normal in structure. Pulmonic valve regurgitation is not visualized.  Aorta: The aortic root and ascending aorta are structurally normal, with no evidence of dilitation.  Venous: The inferior vena cava is normal in size with greater than 50% respiratory variability, suggesting right atrial pressure of 3 mmHg.  IAS/Shunts: No atrial level shunt detected by color flow Doppler.   LEFT VENTRICLE PLAX 2D LVIDd:         4.20 cm   Diastology LVIDs:         2.60 cm   LV e' medial:    5.87 cm/s LV PW:         1.10 cm   LV E/e' medial:  10.4 LV IVS:        1.00 cm   LV e' lateral:   8.49 cm/s LVOT diam:     2.20 cm   LV E/e' lateral: 7.2 LV SV:         45 LV SV Index:   25 LVOT Area:     3.80 cm   RIGHT VENTRICLE             IVC RV Basal diam:  2.60 cm     IVC diam: 1.60 cm RV S prime:     10.20 cm/s TAPSE (M-mode): 2.0 cm  LEFT ATRIUM           Index        RIGHT ATRIUM           Index LA diam:      3.00 cm 1.64 cm/m   RA Area:     11.40 cm LA Vol (A2C): 29.7 ml 16.26 ml/m  RA Volume:   24.40 ml  13.36 ml/m LA Vol (A4C): 36.7 ml 20.09 ml/m AORTIC VALVE AV Area (Vmax):    3.37 cm AV Area (Vmean):   3.08 cm AV Area (VTI):     2.83 cm AV Vmax:           80.40 cm/s AV Vmean:          52.500 cm/s AV VTI:            0.160 m AV Peak Grad:      2.6 mmHg AV Mean Grad:      1.0 mmHg LVOT Vmax:         71.30 cm/s LVOT Vmean:        42.600 cm/s LVOT VTI:          0.119 m LVOT/AV VTI ratio: 0.74  AORTA Ao Root diam: 3.20 cm Ao Asc diam:  2.80 cm  MITRAL VALVE               TRICUSPID VALVE MV Area  (PHT): 3.66 cm  TR Peak grad:   16.6 mmHg MV Decel Time: 207 msec    TR Vmax:        204.00 cm/s MV E velocity: 61.30 cm/s MV A velocity: 63.20 cm/s  SHUNTS MV E/A ratio:  0.97        Systemic VTI:  0.12 m Systemic Diam: 2.20 cm  Zoila Shutter MD Electronically signed by Zoila Shutter MD Signature Date/Time: 01/26/2023/4:02:20 PM    Final   TEE  ECHO TEE 09/13/2021  Narrative TRANSESOPHOGEAL ECHO REPORT    Patient Name:   JERMERY CARATACHEA Date of Exam: 09/13/2021 Medical Rec #:  [id number]      Height:       66.0 in Accession #:    [id number]     Weight:       156.0 lb Date of Birth:  [birth year]       BSA:          1.799 m Patient Age:    75 years       BP:           118/58 mmHg Patient Gender: M              HR:           75 bpm. Exam Location:  Inpatient  Procedure: Transesophageal Echo, Color Doppler and Cardiac Doppler  Indications:     Post Watchman Evaluation  History:         Patient has prior history of Echocardiogram examinations, most recent 08/02/2021. COPD, Arrythmias:Atrial Fibrillation; Risk Factors:Diabetes and Hypertension.  Sonographer:     Eulah Pont RDCS Referring Phys:  [id number] Lanier Prude Diagnosing Phys: Thurmon Fair MD  PROCEDURE: After discussion of the risks and benefits of a TEE, an informed consent was obtained from the patient. The transesophogeal probe was passed without difficulty through the esophogus of the patient. Sedation performed by different physician. The patient was monitored while under deep sedation. Anesthestetic sedation was provided intravenously by Anesthesiology: 117.04mg  of Propofol, 40mg  of Lidocaine. The patient's vital signs; including heart rate, blood pressure, and oxygen saturation; remained stable throughout the procedure. The patient developed no complications during the procedure.  IMPRESSIONS   1. Left ventricular ejection fraction, by estimation, is 60 to 65%. The left ventricle has normal function.  The left ventricle has no regional wall motion abnormalities. 2. Right ventricular systolic function is normal. The right ventricular size is normal. 3. Well-seated Watchman FLX 20 mm device without leak or thrombus. No left atrial/left atrial appendage thrombus was detected. 4. The mitral valve is normal in structure. No evidence of mitral valve regurgitation. No evidence of mitral stenosis. 5. The aortic valve is normal in structure. Aortic valve regurgitation is not visualized. No aortic stenosis is present. 6. There is Moderate (Grade III) layered plaque involving the descending aorta. 7. The inferior vena cava is normal in size with greater than 50% respiratory variability, suggesting right atrial pressure of 3 mmHg.  Conclusion(s)/Recommendation(s): Normal biventricular function without evidence of hemodynamically significant valvular heart disease. Watchman device without leak or thrombus.  FINDINGS Left Ventricle: Left ventricular ejection fraction, by estimation, is 60 to 65%. The left ventricle has normal function. The left ventricle has no regional wall motion abnormalities. The left ventricular internal cavity size was normal in size. There is no left ventricular hypertrophy.  Right Ventricle: The right ventricular size is normal. No increase in right ventricular wall thickness. Right ventricular systolic function is normal.  Left Atrium: Well-seated Watchman FLX  20 mm device without leak or thrombus. Left atrial size was normal in size. No left atrial/left atrial appendage thrombus was detected.  Right Atrium: Right atrial size was normal in size.  Pericardium: There is no evidence of pericardial effusion.  Mitral Valve: The mitral valve is normal in structure. No evidence of mitral valve regurgitation. No evidence of mitral valve stenosis.  Tricuspid Valve: The tricuspid valve is normal in structure. Tricuspid valve regurgitation is not demonstrated. No evidence of tricuspid  stenosis.  Aortic Valve: The aortic valve is normal in structure. Aortic valve regurgitation is not visualized. No aortic stenosis is present.  Pulmonic Valve: The pulmonic valve was normal in structure. Pulmonic valve regurgitation is not visualized. No evidence of pulmonic stenosis.  Aorta: The aortic root is normal in size and structure. There is moderate (Grade III) layered plaque involving the descending aorta.  Venous: The inferior vena cava is normal in size with greater than 50% respiratory variability, suggesting right atrial pressure of 3 mmHg.  IAS/Shunts: No atrial level shunt detected by color flow Doppler. There is no evidence of an atrial septal defect.  Thurmon Fair MD Electronically signed by Thurmon Fair MD Signature Date/Time: 09/13/2021/2:52:36 PM    Final   MONITORS  LONG TERM MONITOR (3-14 DAYS) 05/03/2022  Narrative  Sinus rhythm average heart rate 77 bpm.  Rare PACs. Rare PVCs. Sometimes symptomatic.  No atrial fibrillation noted.  Brief episodes of atrial tachycardia. Benign.   Patch Wear Time:  13 days and 23 hours (2023-04-20T20:33:38-0400 to 2023-05-04T20:02:57-0400)  Patient had a min HR of 61 bpm, max HR of 182 bpm, and avg HR of 77 bpm. Predominant underlying rhythm was Sinus Rhythm. Slight P wave morphology changes were noted. 2 Ventricular Tachycardia runs occurred, the run with the fastest interval lasting 6 beats with a max rate of 179 bpm, the longest lasting 6 beats with an avg rate of 103 bpm. 29 Supraventricular Tachycardia runs occurred, the run with the fastest interval lasting 17 beats with a max rate of 182 bpm (avg 158 bpm); the run with the fastest interval was also the longest. Isolated SVEs were rare (<1.0%), SVE Couplets were rare (<1.0%), and SVE Triplets were rare (<1.0%). Isolated VEs were rare (<1.0%), VE Couplets were rare (<1.0%), and no VE Triplets were present. Donato Schultz, MD            History of Present Illness:    Paul [name] is a 77 y.o. male with the above problem list.  He presents for close cardiology follow up with multiple ED visits/admissions in the last 4 months for COPD exacerbation/HF exacerbation.     Patient was first referred to cardiology for evaluation of atrial fibrillation and multivessel CAD.  Given chronic falls as well as hematuria on anticoagulation, patient was referred for a Watchman device which was placed on August 02, 2021.  Of note patient also historically intolerant to beta-blockers with syncope.  Patient with multiple admissions for COPD/CHF exacerbation.  During an admission from 01/25/2023 to 01/28/2023, patient found with A-fib and RVR he was initially treated with diltiazem and did spontaneously convert back to normal sinus rhythm.  Became hypotensive on diltiazem.  Ultimately discharged beta-blocker was also discontinued due to concerns regarding bradycardia.  At the recommendation of Dr. Ladona Ridgel, patient was scheduled for a follow-up appointment with Dr. Lalla Brothers on 4/17 for PVI ablation evaluation.  Patient felt well for 5 days then developed a dry cough, wheezing, increased lower extremity edema and returned to  the hospital on 2/13.  Again noted to have elevated heart rate/paroxysmal atrial fibrillation.  Given hypotension during prior admission on Cardizem, patient was loaded/started on digoxin 0.125 mg daily as well as carvedilol 6.25 mg twice daily.  Patient was diuresed and returned to normal sinus rhythm at discharge.  Patient was seen in clinic by Wallis Bamberg on 2/20, reported orthostatic symptoms.  Breathing and volume status appear to be stable at the time of this office visit.  Given orthostatic symptoms, patient's carvedilol was decreased to 3.125 mg twice daily.  He was continued on Lasix 40 mg twice a day.  Unfortunately, patient returned to the emergency department on 2/24 for 24 hours of worsening shortness of breath.  Patient was diuresed down to a dry weight of 149  pounds, and discharged on 3/1.  Patient then returned to the emergency department on 3/3 with cough and shortness of breath (though apparently less severe than symptoms prior to admission on 2/24).  Patient was ultimately sent home from the emergency department with instructions to increase Lasix for a few days.  On 3/19, patient underwent sleep study with recommendation to use nightly CPAP with 2 L of supplemental oxygen.  Patient has also recently seen both his PCP and pulmonology, both on 4/3.  Today patient tells me "this is the best I felt in a very long time."  He reports only taking Lasix 40 mg once daily with as needed extra dose despite office visit notes and med rec indicating twice daily dosing.  Unclear who advised this change/when this took place.  But per patient today, it sounds like he has been taking this dose for at least the past 3 weeks.  Patient says that since his last emergency department visit, he has made a concerted effort to decrease salt in his diet, has cut back to no more than 1000 mg of sodium per day.  Since making this change, patient reports a dramatic improvement in breathing/lower extremity edema.  In fact, he has only needed to take an extra 40 mg dose once in the last 3 weeks.  Inpatient admits that this was a result of eating a very salty meal at a restaurant.  He continues to check daily standing weights, reports a daily weight between about 149 and 151 pounds.  Regarding dizziness reported at last office visit, patient says that the symptoms improved though have not fully resolved with decreased carvedilol dosing.  Although he still experiences some dizziness with rapid positional changes, he says that symptoms at this time are completely manageable.  He makes a concerted effort to ensure resolution of dizziness before moving/walking.  He feels like his breathing has remained very stable, only uses supplemental oxygen with his CPAP at night.  Denies changes to exertional  tolerance.  Also denies chest pain, palpitations, orthopnea, syncope.  Patient also denies recent falls.    Past Medical History:  Diagnosis Date   Anemia    Aortic atherosclerosis    Aspiration pneumonia due to inhalation of vomitus 02/03/2022   Atrial fibrillation    Back pain    COPD (chronic obstructive pulmonary disease)    COPD exacerbation 12/27/2021   COVID-19 12/24/2020   Diabetes mellitus without complication    Dizziness 04/08/2022   Elevated PSA    GAD (generalized anxiety disorder)    GERD (gastroesophageal reflux disease)    High cholesterol    Hypertension    Lingular pneumonia 01/05/2021   See cxr 01/06/20 rx with zpak/ cefipime x  5 days and then developed purulent sputum again 01/12/21 so rx levcaquin 500 mg daily x 7 days then return for cxr before more    Lung nodule < 6cm on CT 05/29/2016   Osteoporosis    Oxygen deficiency    Pneumonia    January 2022   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 Lalla BrothersLambert with a 20 mm Watchman FLX device   RSV bronchitis 01/27/2022   Vertebral compression fracture 05/29/2019   T8 compression fracture noted on CT scan 05/28/2019 inpatient with chronic steroid dependent COPD - rx calcitonin nasal spray rx per PCP    Current Medications: Current Meds  Medication Sig   acetaminophen (TYLENOL) 500 MG tablet Take 1,000 mg by mouth at bedtime as needed (pain).   albuterol (PROAIR HFA) 108 (90 Base) MCG/ACT inhaler Inhale 2 puffs into the lungs as needed for wheezing or shortness of breath.   albuterol (PROVENTIL) (2.5 MG/3ML) 0.083% nebulizer solution Take 3 mLs (2.5 mg total) by nebulization every 6 (six) hours as needed for shortness of breath or wheezing.   ALPRAZolam (XANAX) 0.5 MG tablet Take 1 tablet (0.5 mg total) by mouth 2 (two) times daily as needed for anxiety.   amLODipine (NORVASC) 2.5 MG tablet Take 1 tablet (2.5 mg total) by mouth daily.   amoxicillin-clavulanate (AUGMENTIN)  875-125 MG tablet Take 1 tablet by mouth 2 (two) times daily.   Ascorbic Acid (VITAMIN C) 1000 MG tablet Take 1,000 mg by mouth daily.   aspirin EC 81 MG tablet Take 1 tablet (81 mg total) by mouth daily. Swallow whole.   atorvastatin (LIPITOR) 40 MG tablet TAKE 1 TABLET BY MOUTH EVERYDAY AT BEDTIME   Budeson-Glycopyrrol-Formoterol (BREZTRI AEROSPHERE) 160-9-4.8 MCG/ACT AERO Inhale 2 puffs into the lungs 2 (two) times daily.   calcium carbonate (OS-CAL) 600 MG TABS tablet Take 600 mg by mouth daily.   carvedilol (COREG) 3.125 MG tablet Take 1 tablet (3.125 mg total) by mouth 2 (two) times daily with a meal.   Cholecalciferol (VITAMIN D3) 50 MCG (2000 UT) TABS Take 2,000 Units by mouth in the morning.   denosumab (PROLIA) 60 MG/ML SOSY injection Inject 60 mg into the skin every 6 (six) months.   digoxin (LANOXIN) 0.125 MG tablet Take 1 tablet (0.125 mg total) by mouth daily.   ferrous sulfate 325 (65 FE) MG tablet Take 325 mg by mouth 2 (two) times daily with a meal.   fluticasone (FLONASE) 50 MCG/ACT nasal spray Place 2 sprays into both nostrils daily as needed for allergies or rhinitis.   furosemide (LASIX) 40 MG tablet Take 1 tablet by mouth daily, may take 1 extra tablet daily only as needed for weight gain or lower extremity swelling   loratadine (CLARITIN) 10 MG tablet Take 10 mg by mouth in the morning.   Melatonin 12 MG TABS Take 12 mg by mouth at bedtime.   Multiple Vitamin (MULTIVITAMIN WITH MINERALS) TABS tablet Take 1 tablet by mouth daily with breakfast.   OXYGEN Inhale 2 L/min into the lungs at bedtime.   pantoprazole (PROTONIX) 40 MG tablet Take 1 tablet (40 mg total) by mouth 2 (two) times daily.   predniSONE (DELTASONE) 10 MG tablet TAKE 1 TABLET (10 MG TOTAL) BY MOUTH DAILY WITH BREAKFAST.   Probiotic Product (PROBIOTIC DAILY) CAPS Take 1 capsule by mouth in the morning.   sertraline (ZOLOFT) 50 MG tablet TAKE 1 TABLET BY MOUTH EVERYDAY AT BEDTIME   [DISCONTINUED] furosemide  (  LASIX) 40 MG tablet Take 1 tablet (40 mg total) by mouth 2 (two) times daily.    Allergies:   Tape, Daliresp [roflumilast], Levaquin [levofloxacin], and Alendronate   Social History   Occupational History   Occupation: retired    Comment: truck Hospital doctor  Tobacco Use   Smoking status: Former    Packs/day: 2.00    Years: 52.00    Additional pack years: 0.00    Total pack years: 104.00    Types: Cigarettes    Quit date: 02/03/2009    Years since quitting: 14.1   Smokeless tobacco: Never   Tobacco comments:    Counseled to remain smoke free  Vaping Use   Vaping Use: Never used  Substance and Sexual Activity   Alcohol use: Not Currently    Alcohol/week: 0.0 standard drinks of alcohol    Comment: occ   Drug use: No   Sexual activity: Not on file    Family Hx: The patient's family history includes Cancer in his paternal uncle; Heart disease in his brother, father, and mother.  Review of Systems  Constitutional: Negative for weight gain and weight loss.  Cardiovascular:  Positive for leg swelling (isolated swelling after a salty meal). Negative for chest pain, dyspnea on exertion, irregular heartbeat, orthopnea, palpitations and syncope.  Respiratory:  Positive for shortness of breath (baseline). Negative for cough and snoring.   All other systems reviewed and are negative.    EKGs/Labs/Other Test Reviewed:    EKG:  EKG is not ordered today.    Recent Labs: 01/25/2023: TSH 0.792 02/19/2023: Magnesium 2.2 02/23/2023: B Natriuretic Peptide 145.6 03/21/2023: ALT 42; BUN 10; Creatinine, Ser 0.81; Hemoglobin 11.7; Platelets 332; Potassium 4.1; Sodium 141   Recent Lipid Panel Recent Labs    03/21/23 0929  CHOL 167  TRIG 156*  HDL 76  LDLCALC 65      Risk Assessment/Calculations/Metrics:    CHA2DS2-VASc Score = 6   This indicates a 9.7% annual risk of stroke. The patient's score is based upon: CHF History: 1 HTN History: 1 Diabetes History: 1 Stroke History: 0 Vascular  Disease History: 1 Age Score: 2 Gender Score: 0             Physical Exam:    VS:  BP (!) 130/52   Pulse 82   Ht 5\' 6"  (1.676 m)   Wt 152 lb 12.8 oz (69.3 kg)   SpO2 95%   BMI 24.66 kg/m     Wt Readings from Last 3 Encounters:  04/02/23 152 lb 12.8 oz (69.3 kg)  03/26/23 151 lb (68.5 kg)  03/26/23 151 lb 6.4 oz (68.7 kg)    Constitutional:      Appearance: Healthy appearance. Not in distress.  Eyes:     Pupils: Pupils are equal, round, and reactive to light.  Pulmonary:     Effort: Pulmonary effort is normal.     Breath sounds: Rhonchi (left upper anterior chest, cleared with cough) present.  Cardiovascular:     PMI at left midclavicular line. Normal rate. Regular rhythm. Normal S1. Normal S2.      Murmurs: There is no murmur.     No gallop.  No click. No rub.  Pulses:    Intact distal pulses.  Edema:    Peripheral edema absent.  Skin:    General: Skin is warm and dry.  Neurological:     Mental Status: Alert and oriented to person, place and time.  ASSESSMENT & PLAN:   Paroxysmal atrial fibrillation [facility]) Patient s/p Watchman with paroxysmal afib during 2 separate admissions for COPD/CHF exacerbation in February. Regular rate/rhythm on exam today and patient denies palpitations/symptoms of rapid HR.   Continue Digoxin 0.125mg  QD. Digoxin level 0.8 on 2/21. Continue Coreg 3125mg  BID. Encouraged patient to report back if he has worsening orthostatic symptoms. Patient scheduled to seen Dr. Lalla Brothers on 4/17. Was seen during February hospitalization by Dr. Ladona Ridgel who felt patient should be evaluated for possible PVI ablation.  Chronic diastolic CHF (congestive heart failure) (HCC) Patient with very stable HFpEF since last ED visit in March. It sounds like he's made major changes to salt intake with positive results. Given DM, inquired with pharmacy team about SGLT2 pricing but with patient's current medicare part D plan, no co-pay and he would bear most of cost.    Continue Lasix 40mg  QD with PRN extra dose for weight gain greater than 3 pounds or lower extremity edema. Renal function stable on 3/29 labs. Continue Coreg 3.125mg  BID With orthostatic symptoms, will defer initiating MRA Continue low salt diet with fluid intake restricted to <70 oz.  OSA (obstructive sleep apnea) Continue nightly CPAP use. Discussed that untreated OSA is a risk factor for afib.  Coronary artery calcification Patient without anginal symptoms.  Continue Coreg 3.125mg  BID Continue Atorvastatin 40mg  Continue ASA 81mg   Hypertension associated with diabetes (HCC) Patient's BP is well controlled today. Continues with dizziness, though significantly improved with decreased Coreg.  Continue amlodipine 2.5mg  Continue Coreg 3.125mg  BID  COPD with acute exacerbation (HCC) Patient's COPD appears well controlled at this time. Reassuring physical exam. Continue management per pulmonology.  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HCC) A1c 6.6% as of 3/29. Dietary management per PCP. As above, would potentially consider initiating SGLT2 if patient qualified for cost assistance and/or changes his medicare part D plan next year.  Secondary hypercoagulable state Paroxysmal afib. S/P Watchman 08/02/21.            Dispo:  No follow-ups on file.   Medication Adjustments/Labs and Tests Ordered: Current medicines are reviewed at length with the patient today.  Concerns regarding medicines are outlined above.  Tests Ordered: No orders of the defined types were placed in this encounter.  Medication Changes: No orders of the defined types were placed in this encounter.  Signed, Perlie Gold, PA-C  04/02/2023 4:56 PM    [facility] [address] [place], [place], [place]  [address] Phone: [phone]; Fax: [phone]

## 2023-04-02 NOTE — Patient Instructions (Signed)
Medication Instructions:  Your physician recommends that you continue on your current medications as directed. Please refer to the Current Medication list given to you today.  *If you need a refill on your cardiac medications before your next appointment, please call your pharmacy*   Lab Work: None ordered  If you have labs (blood work) drawn today and your tests are completely normal, you will receive your results only by: MyChart Message (if you have MyChart) OR A paper copy in the mail If you have any lab test that is abnormal or we need to change your treatment, we will call you to review the results.   Testing/Procedures: None ordered   Follow-Up: At [facility], you and your health needs are our priority.  As part of our continuing mission to provide you with exceptional heart care, we have created designated Provider Care Teams.  These Care Teams include your primary Cardiologist (physician) and Advanced Practice Providers (APPs -  Physician Assistants and Nurse Practitioners) who all work together to provide you with the care you need, when you need it.  We recommend signing up for the patient portal called "MyChart".  Sign up information is provided on this After Visit Summary.  MyChart is used to connect with patients for Virtual Visits (Telemedicine).  Patients are able to view lab/test results, encounter notes, upcoming appointments, etc.  Non-urgent messages can be sent to your provider as well.   To learn more about what you can do with MyChart, go to [email or web address].    Your next appointment:   3 month(s)  Provider:   Donato Schultz, MD     Other Instructions

## 2023-04-02 NOTE — Assessment & Plan Note (Addendum)
Patient s/p Watchman with paroxysmal afib during 2 separate admissions for COPD/CHF exacerbation in February. Regular rate/rhythm on exam today and patient denies palpitations/symptoms of rapid HR.   Continue Digoxin 0.125mg  QD. Digoxin level 0.8 on 2/21. Continue Coreg 3125mg  BID. Encouraged patient to report back if he has worsening orthostatic symptoms. Patient scheduled to seen Dr. Lalla Brothers on 4/17. Was seen during February hospitalization by Dr. Ladona Ridgel who felt patient should be evaluated for possible PVI ablation.

## 2023-04-02 NOTE — Assessment & Plan Note (Signed)
Patient's BP is well controlled today. Continues with dizziness, though significantly improved with decreased Coreg.  Continue amlodipine 2.5mg  Continue Coreg 3.125mg  BID

## 2023-04-02 NOTE — Assessment & Plan Note (Signed)
Continue nightly CPAP use. Discussed that untreated OSA is a risk factor for afib.

## 2023-04-02 NOTE — Assessment & Plan Note (Signed)
Paroxysmal afib. S/P Watchman 08/02/21.

## 2023-04-02 NOTE — Assessment & Plan Note (Addendum)
Patient without anginal symptoms.  Continue Coreg 3.125mg  BID Continue Atorvastatin 40mg  Continue ASA 81mg 

## 2023-04-03 ENCOUNTER — Telehealth: Payer: Self-pay

## 2023-04-03 NOTE — Progress Notes (Cosign Needed)
Called pt today and he stated his Cardiologist recommended him being on [place] or [place]. Pt wants to apply for PAP and see which one he can get. I will start those applications today and they will be mailed to pt. Pt understood the next instructions.  Roxana Hires, CMA Clinical Pharmacist Assistant  [phone]

## 2023-04-08 ENCOUNTER — Telehealth: Payer: Self-pay

## 2023-04-08 NOTE — Progress Notes (Signed)
.Care Management & Coordination Services Pharmacy Team  Reason for Encounter: Hypertension  Contacted patient to discuss hypertension disease state. Spoke with patient on 04/08/2023     Current antihypertensive regimen:  Amlodipine 2.5mg  daily Carvedilol 3.125mg  two times daily Digoxin 0.125mg  daily  Furosemide  daily prn Patient verbally confirms he is taking the above medications as directed. Yes  How often are you checking your Blood Pressure? daily  he checks his blood pressure in the morning before taking his medication.  Current home BP readings:  04/01/23 131/64 86, 04/02/23 134/67 80, 04/03/23 129/69 94, 04/04/23 129/66 82, 04/05/23 130/63 85, 04/06/23 122/61 79, 04/07/23 131/69 80, 04/08/23 127/63 78  Wrist or arm cuff:Arm  Caffeine intake:None  Salt intake:Limited  OTC medications including pseudoephedrine or NSAIDs? Tylenol prn   Any readings above 180/100? No  What recent interventions/DTPs have been made by any provider to improve Blood Pressure control since last CPP Visit: No changes   Any recent hospitalizations or ED visits since last visit with CPP? No  What diet changes have been made to improve Blood Pressure Control?  Low sodium/Carb Diet   What exercise is being done to improve your Blood Pressure Control?  Pt goes to the gym 3 days a week  when its warm outside   Adherence Review: Is the patient currently on ACE/ARB medication? No Does the patient have >5 day gap between last estimated fill dates?   Star Rating Drugs:  Medication:  Last Fill: Day Supply None noted   Chart Updates: Recent office visits:  04/01/23 Caro Hight LPN. Seen for Medicare Annual Wellness. No med changes.   03/26/23 Blane Ohara MD. Seen for routine visit. No med changes.   03/13/23 Sabino Donovan RN. Seen for Prolia  injection. No med changes.   Recent consult visits:  04/02/23 (Cardiologist) Chester Holstein PA-C. Seen for CHF. No med changes.   03/26/23  (Pulmonologist) Clent Ridges, Tammy NP. Seen for COPD. No med changes.   Hospital visits:  None  Medications: Outpatient Encounter Medications as of 04/08/2023  Medication Sig   acetaminophen (TYLENOL) 500 MG tablet Take 1,000 mg by mouth at bedtime as needed (pain).   albuterol (PROAIR HFA) 108 (90 Base) MCG/ACT inhaler Inhale 2 puffs into the lungs as needed for wheezing or shortness of breath.   albuterol (PROVENTIL) (2.5 MG/3ML) 0.083% nebulizer solution Take 3 mLs (2.5 mg total) by nebulization every 6 (six) hours as needed for shortness of breath or wheezing.   ALPRAZolam (XANAX) 0.5 MG tablet Take 1 tablet (0.5 mg total) by mouth 2 (two) times daily as needed for anxiety.   amLODipine (NORVASC) 2.5 MG tablet Take 1 tablet (2.5 mg total) by mouth daily.   amoxicillin-clavulanate (AUGMENTIN) 875-125 MG tablet Take 1 tablet by mouth 2 (two) times daily.   Ascorbic Acid (VITAMIN C) 1000 MG tablet Take 1,000 mg by mouth daily.   aspirin EC 81 MG tablet Take 1 tablet (81 mg total) by mouth daily. Swallow whole.   atorvastatin (LIPITOR) 40 MG tablet TAKE 1 TABLET BY MOUTH EVERYDAY AT BEDTIME   Budeson-Glycopyrrol-Formoterol (BREZTRI AEROSPHERE) 160-9-4.8 MCG/ACT AERO Inhale 2 puffs into the lungs 2 (two) times daily.   calcium carbonate (OS-CAL) 600 MG TABS tablet Take 600 mg by mouth daily.   carvedilol (COREG) 3.125 MG tablet Take 1 tablet (3.125 mg total) by mouth 2 (two) times daily with a meal.   Cholecalciferol (VITAMIN D3) 50 MCG (2000 UT) TABS Take 2,000 Units by mouth in the morning.  denosumab (PROLIA) 60 MG/ML SOSY injection Inject 60 mg into the skin every 6 (six) months.   digoxin (LANOXIN) 0.125 MG tablet Take 1 tablet (0.125 mg total) by mouth daily.   ferrous sulfate 325 (65 FE) MG tablet Take 325 mg by mouth 2 (two) times daily with a meal.   fluticasone (FLONASE) 50 MCG/ACT nasal spray Place 2 sprays into both nostrils daily as needed for allergies or rhinitis.   furosemide  (LASIX) 40 MG tablet Take 1 tablet by mouth daily, may take 1 extra tablet daily only as needed for weight gain or lower extremity swelling   loratadine (CLARITIN) 10 MG tablet Take 10 mg by mouth in the morning.   Melatonin 12 MG TABS Take 12 mg by mouth at bedtime.   Multiple Vitamin (MULTIVITAMIN WITH MINERALS) TABS tablet Take 1 tablet by mouth daily with breakfast.   OXYGEN Inhale 2 L/min into the lungs at bedtime.   pantoprazole (PROTONIX) 40 MG tablet Take 1 tablet (40 mg total) by mouth 2 (two) times daily.   predniSONE (DELTASONE) 10 MG tablet TAKE 1 TABLET (10 MG TOTAL) BY MOUTH DAILY WITH BREAKFAST.   Probiotic Product (PROBIOTIC DAILY) CAPS Take 1 capsule by mouth in the morning.   sertraline (ZOLOFT) 50 MG tablet TAKE 1 TABLET BY MOUTH EVERYDAY AT BEDTIME   No facility-administered encounter medications on file as of 04/08/2023.    Recent Office Vitals: BP Readings from Last 3 Encounters:  04/02/23 (!) 130/52  03/26/23 (!) 134/48  03/26/23 (!) 124/50   Pulse Readings from Last 3 Encounters:  04/02/23 82  03/26/23 81  03/26/23 80    Wt Readings from Last 3 Encounters:  04/02/23 152 lb 12.8 oz (69.3 kg)  03/26/23 151 lb (68.5 kg)  03/26/23 151 lb 6.4 oz (68.7 kg)     Kidney Function Lab Results  Component Value Date/Time   CREATININE 0.81 03/21/2023 09:29 AM   CREATININE 0.91 02/23/2023 04:06 PM   GFRNONAA >60 02/23/2023 04:06 PM   GFRAA 107 11/20/2020 12:02 PM       Latest Ref Rng & Units 03/21/2023    9:29 AM 02/23/2023    4:06 PM 02/23/2023    3:06 PM  BMP  Glucose 70 - 99 mg/dL 409  811    BUN 8 - 27 mg/dL 10  29    Creatinine 9.14 - 1.27 mg/dL 7.82  9.56    BUN/Creat Ratio 10 - 24 12     Sodium 134 - 144 mmol/L 141  131  130   Potassium 3.5 - 5.2 mmol/L 4.1  3.7  4.7   Chloride 96 - 106 mmol/L 97  87    CO2 20 - 29 mmol/L 25  34    Calcium 8.6 - 10.2 mg/dL 9.9  8.2       Roxana Hires, [facility] Clinical Pharmacist Assistant  [phone]

## 2023-04-09 ENCOUNTER — Ambulatory Visit: Payer: Self-pay

## 2023-04-09 ENCOUNTER — Encounter: Payer: Self-pay | Admitting: Cardiology

## 2023-04-09 ENCOUNTER — Ambulatory Visit: Payer: Medicare Other | Attending: Cardiology | Admitting: Cardiology

## 2023-04-09 VITALS — BP 138/62 | HR 84 | Ht 66.0 in | Wt 153.0 lb

## 2023-04-09 DIAGNOSIS — J441 Chronic obstructive pulmonary disease with (acute) exacerbation: Secondary | ICD-10-CM | POA: Insufficient documentation

## 2023-04-09 DIAGNOSIS — I5032 Chronic diastolic (congestive) heart failure: Secondary | ICD-10-CM | POA: Insufficient documentation

## 2023-04-09 DIAGNOSIS — I480 Paroxysmal atrial fibrillation: Secondary | ICD-10-CM | POA: Diagnosis not present

## 2023-04-09 DIAGNOSIS — Z95818 Presence of other cardiac implants and grafts: Secondary | ICD-10-CM | POA: Insufficient documentation

## 2023-04-09 NOTE — Progress Notes (Signed)
Electrophysiology Office Follow up Visit Note:    Date:  04/09/2023   ID:  Paul [name], DOB [birth year], MRN [id number]  PCP:  Blane Ohara, MD  [facility] Cardiologist:  Donato Schultz, MD  [facility] Electrophysiologist:  Lanier Prude, MD    Interval History:    Paul [name] is a 77 y.o. male who presents for a follow up visit.   He is s/p Watchman implant 08/02/2021.  Was seen during February hospitalization by Dr. Ladona Ridgel who felt the patient should be evaluated for possible PVI ablation.   Today, he is accompanied by a family member. He reports having two episodes of atrial fibrillation over the past 3 years. The last time he was aware of his arrhythmia, he initially "wasn't feeling right" and noticed his heart rate was too high. He denies any episodes of Afib since he was last in the hospital. He was on amiodarone in the past. He has been off of it for longer than 6 months.  Currently his breathing is reportedly okay, especially during the day. Coldness and dampness will exacerbate his shortness of breath. His is on oxygen at night. He also uses it when he exercises at the gym.  In general he tells me that his breathing status has worsened since his watchman implant.  Previously his coreg was reduced by half due to becoming tremulous and passing out after standing up. Generally he is feeling much better since the medication was decreased. Sometimes when getting up quickly he still becomes lightheaded. He is recognizing this now and he is able to stop and sit back down without falling.  He denies any palpitations, chest pain, or peripheral edema. No headaches, syncope, orthopnea, or PND.  Past medical, surgical, social and family histories reviewed.    ROS:   Please see the history of present illness.    (+) Lightheadedness All other systems reviewed and are negative.  EKGs/Labs/Other Studies Reviewed:    The following studies were reviewed today:  Echocardiogram   01/26/2023:  1. Left ventricular ejection fraction, by estimation, is 65 to 70%. The  left ventricle has normal function. The left ventricle has no regional  wall motion abnormalities. Left ventricular diastolic parameters are  consistent with Grade I diastolic  dysfunction (impaired relaxation).   2. Right ventricular systolic function is low normal. The right  ventricular size is normal. The estimated right ventricular systolic  pressure is 19.6 mmHg.   3. The mitral valve is grossly normal. Trivial mitral valve  regurgitation.   4. The aortic valve is tricuspid. Aortic valve regurgitation is trivial.  Aortic valve sclerosis is present, with no evidence of aortic valve  stenosis.   5. The inferior vena cava is normal in size with greater than 50%  respiratory variability, suggesting right atrial pressure of 3 mmHg.   Comparison(s): Changes from prior study are noted. 09/13/2021: LVEF 60-65%.   EKG:  EKG is personally reviewed.  04/09/2023:  EKG was not ordered.     Physical Exam:    VS:  BP 138/62 (BP Location: Left Arm, Patient Position: Sitting, Cuff Size: Normal)   Pulse 84   Ht  (1.676 m)   Wt 153 lb (69.4 kg)   BMI 24.69 kg/m     Wt Readings from Last 3 Encounters:  04/09/23 153 lb (69.4 kg)  04/09/23 150 lb (68 kg)  04/02/23 152 lb 12.8 oz (69.3 kg)     GEN: Well nourished, well developed in no acute  distress CARDIAC: RRR, no murmurs, rubs, gallops PSYCHIATRIC:  Normal affect        ASSESSMENT:    1. Paroxysmal atrial fibrillation   2. Chronic diastolic CHF (congestive heart failure)   3. COPD with acute exacerbation   4. Presence of Watchman left atrial appendage closure device    PLAN:    In order of problems listed above:  #Paroxysmal atrial fibrillation #Presence of Watchman device Overall low burden of atrial fibrillation but when he is in A-fib it is symptomatic especially given his severe lung disease history.  I discussed options with the  patient during today's clinic visit including continuing with a conservative/watchful waiting approach, adding an antiarrhythmic and catheter ablation.  I do not think he is a good candidate for catheter ablation given his severe lung disease.  The patient and his wife are in full agreement with that.  I discussed amiodarone and Tikosyn.  I do not think amiodarone is the best option given the patient's severe lung disease history.  I do think he would be a candidate for Tikosyn.  I discussed the process of loading Tikosyn in detail with the patient including the risks.  He would like to continue with a more conservative approach for now and if he were to have a recurrence of arrhythmia in the future he would like to proceed with dofetilide loading.  I think this is a reasonable approach.  In the meantime, he will continue aspirin monotherapy given he has a Watchman device in place that is well endothelialized.  #COPD Severe.  On nighttime oxygen.  Prevents safe ablation.  Follow-up in 6 months.  Medication Adjustments/Labs and Tests Ordered: Current medicines are reviewed at length with the patient today.  Concerns regarding medicines are outlined above.   No orders of the defined types were placed in this encounter.  No orders of the defined types were placed in this encounter.   I,Mathew Stumpf,acting as a Neurosurgeon for Lanier Prude, MD.,have documented all relevant documentation on the behalf of Lanier Prude, MD,as directed by  Lanier Prude, MD while in the presence of Lanier Prude, MD.  I, Lanier Prude, MD, have reviewed all documentation for this visit. The documentation on 04/09/23 for the exam, diagnosis, procedures, and orders are all accurate and complete.  Signed, Steffanie Dunn, MD, [facility], [facility] 04/09/2023 9:13 PM    Electrophysiology [facility]

## 2023-04-09 NOTE — Patient Outreach (Signed)
  Care Coordination   Follow Up Visit Note   04/09/2023 Name: Paul [name] MRN: [id number] DOB: 08/06/1946  Paul [name] is a 77 y.o. year old male who sees Cox, Kirsten, MD for primary care. I spoke with  Paul [name] by phone today.  What matters to the patients health and wellness today?  Spoke with patient today who reports that he is doing well. Has had recent follow up appointments with PCP, pulmonary and cardiology.  Patient reports that the Augmentin cleared up his sputum.  Reports he has been exercising and using his oxygen.  Reports that CPAP is working well and he feels like he is sleeping better and not as short of breath.  He also states that he feels more relaxed when he gets up in the mornings.   Reports that he continues to weigh daily and range is 148-150 pounds.  Reports slight swelling in the legs. Continues to follow a low salt diet.  Reports taking his medications as prescribed. Denies any new problems or concerns today. Reports that his wife is better.      Goals Addressed               This Visit's Progress     Improve my breathinhg (pt-stated)          Interventions Today    Flowsheet Row Most Recent Value  Chronic Disease   Chronic disease during today's visit Chronic Obstructive Pulmonary Disease (COPD), Congestive Heart Failure (CHF)  General Interventions   General Interventions Discussed/Reviewed General Interventions Reviewed, Durable Medical Equipment (DME)  Durable Medical Equipment (DME) Oxygen, Other  [Cpap and scales]  PCP/Specialist Visits Compliance with follow-up visit  Exercise Interventions   Exercise Discussed/Reviewed Exercise Reviewed, Physical Activity  Physical Activity Discussed/Reviewed Home Exercise Program (HEP)  Education Interventions   Education Provided Provided Education  [Reviewed with patient to continue to call MD for changes in conditions. Reviewed heart failure zones.]  Provided Verbal Education On Nutrition, When to see  the doctor, Medication  Nutrition Interventions   Nutrition Discussed/Reviewed Decreasing salt  Pharmacy Interventions   Pharmacy Dicussed/Reviewed Medications and their functions      Follow up appointment planned for 1 month. Encourage patient to call me if he needs me sooner. Reviewed that care connections is active once a again and he has had 2 home visit. The next visits will be by telephone.         SDOH assessments and interventions completed:  No     Care Coordination Interventions:  Yes, provided   Follow up plan: Follow up call scheduled for 05/12/2023    Encounter Outcome:  Pt. Visit Completed   Rowe Pavy, RN, BSN, CEN [facility] Coordinator [phone]

## 2023-04-09 NOTE — Patient Instructions (Addendum)
Medication Instructions:  Your physician recommends that you continue on your current medications as directed. Please refer to the Current Medication list given to you today.  *If you need a refill on your cardiac medications before your next appointment, please call your pharmacy*  Lab Work: None ordered.  If you have labs (blood work) drawn today and your tests are completely normal, you will receive your results only by: MyChart Message (if you have MyChart) OR A paper copy in the mail If you have any lab test that is abnormal or we need to change your treatment, we will call you to review the results.  Testing/Procedures: None ordered.  Follow-Up: At [facility], you and your health needs are our priority.  As part of our continuing mission to provide you with exceptional heart care, we have created designated Provider Care Teams.  These Care Teams include your primary Cardiologist (physician) and Advanced Practice Providers (APPs -  Physician Assistants and Nurse Practitioners) who all work together to provide you with the care you need, when you need it.  Your next appointment:    Please schedule a 6 month follow up appointment with Dr. Jeanie Cooks.    The format for your next appointment:   In Person  Provider:   Steffanie Dunn, MD{or one of the following Advanced Practice Providers on your designated Care Team:    INFORMATION BELOW:   Tikosyn / Dofetilide    Dofetilide Capsules What is this medication? DOFETILIDE (doe FET il ide) treats a fast or irregular heartbeat (arrhythmia). It works by slowing down overactive electric signals in the heart, which stabilizes your heart rhythm. It belongs to a group of medications called antiarrhythmics. This medicine may be used for other purposes; ask your health care provider or pharmacist if you have questions. COMMON BRAND NAME(S): Tikosyn What should I tell my care team before I take this medication? They need to know if you  have any of these conditions: Heart disease History of irregular heartbeat History of low levels of potassium or magnesium in the blood Kidney disease Liver disease An unusual or allergic reaction to dofetilide, other medications, foods, dyes, or preservatives Pregnant or trying to get pregnant Breast-feeding How should I use this medication? Take this medication by mouth with a glass of water. Follow the directions on the prescription label. Do not take with grapefruit juice. You can take it with or without food. If it upsets your stomach, take it with food. Take your medication at regular intervals. Do not take it more often than directed. Do not stop taking except on your care team's advice. A special MedGuide will be given to you by the pharmacist with each prescription and refill. Be sure to read this information carefully each time. Talk to your care team about the use of this medication in children. Special care may be needed. Overdosage: If you think you have taken too much of this medicine contact a poison control center or emergency room at once. NOTE: This medicine is only for you. Do not share this medicine with others. What if I miss a dose? If you miss a dose, skip it. Take your next dose at the normal time. Do not take extra or 2 doses at the same time to make up for the missed dose. What may interact with this medication? Do not take this medication with any of the following: Cimetidine Cisapride Dolutegravir Dronedarone Erdafitinib Hydrochlorothiazide Ketoconazole Megestrol Pimozide Prochlorperazine Thioridazine Trimethoprim Verapamil This medication may also interact  with the following: Amiloride Cannabinoids Certain antibiotics like erythromycin or clarithromycin Certain antiviral medications for HIV or hepatitis Certain medications for depression, anxiety, or psychotic disorders Digoxin Diltiazem Grapefruit juice Metformin Nefazodone Other medications  that prolong the QT interval (an abnormal heart rhythm) Quinine Triamterene Zafirlukast Ziprasidone This list may not describe all possible interactions. Give your health care provider a list of all the medicines, herbs, non-prescription drugs, or dietary supplements you use. Also tell them if you smoke, drink alcohol, or use illegal drugs. Some items may interact with your medicine. What should I watch for while using this medication? Your condition will be monitored carefully while you are receiving this medication. What side effects may I notice from receiving this medication? Side effects that you should report to your care team as soon as possible: Allergic reactions--skin rash, itching, hives, swelling of the face, lips, tongue, or throat Chest pain Heart rhythm changes--fast or irregular heartbeat, dizziness, feeling faint or lightheaded, chest pain, trouble breathing Side effects that usually do not require medical attention (report to your care team if they continue or are bothersome): Dizziness Headache Nausea Stomach pain Trouble sleeping This list may not describe all possible side effects. Call your doctor for medical advice about side effects. You may report side effects to FDA at 1-800-FDA-1088. Where should I keep my medication? Keep out of the reach of children. Store at room temperature between 15 and 30 degrees C (59 and 86 degrees F). Throw away any unused medication after the expiration date. NOTE: This sheet is a summary. It may not cover all possible information. If you have questions about this medicine, talk to your doctor, pharmacist, or health care provider.  2023 Elsevier/Gold Standard (2021-06-19 00:00:00)

## 2023-04-10 ENCOUNTER — Telehealth: Payer: Self-pay

## 2023-04-10 NOTE — Patient Outreach (Signed)
  Care Coordination   Multidisciplinary Case Review Note    04/10/2023 Name: Paul [name] MRN: [id number] DOB: Sep 01, 1946  Paul [name] is a 77 y.o. year old male who sees Cox, Kirsten, MD for primary care.  The  multidisciplinary care team met today to review patient care needs and barriers.      SDOH assessments and interventions completed:  No     Care Coordination Interventions Activated:  Yes  Interventions Today    Flowsheet Row Most Recent Value  Chronic Disease   Chronic disease during today's visit Chronic Obstructive Pulmonary Disease (COPD), Congestive Heart Failure (CHF)  General Interventions   General Interventions Discussed/Reviewed Communication with  [Cases reviewed with multi disciplinary team. Will continue to follow.]       Care Coordination Interventions:  Yes, provided   Follow up plan: Follow up call scheduled for 1 month    Multidisciplinary Team Attendees:   Rowe Pavy RN Ricke Hey RN [facility] team  Scribe for Multidisciplinary Case Review:  Rowe Pavy RN  Rowe Pavy, RN, BSN, CEN [facility] Coordinator [phone]

## 2023-04-24 ENCOUNTER — Telehealth: Payer: Self-pay

## 2023-04-24 NOTE — Progress Notes (Signed)
Faxed applications to [employer] patient assistance for Marcelline Deist and to [facility]ares patient assistance for Amity on 04/21/2023.     Billee Cashing, CMA Clinical Pharmacist Assistant [phone]

## 2023-04-30 DIAGNOSIS — L570 Actinic keratosis: Secondary | ICD-10-CM | POA: Diagnosis not present

## 2023-04-30 DIAGNOSIS — L578 Other skin changes due to chronic exposure to nonionizing radiation: Secondary | ICD-10-CM | POA: Diagnosis not present

## 2023-04-30 DIAGNOSIS — C44529 Squamous cell carcinoma of skin of other part of trunk: Secondary | ICD-10-CM | POA: Diagnosis not present

## 2023-05-01 ENCOUNTER — Telehealth: Payer: Self-pay

## 2023-05-01 NOTE — Progress Notes (Signed)
Patient approved to receive Farxiga through [employer] patient assistance program.  Patient was denied to receive Jardiance through [facility] cares patient assistance program due to exceeding their income limitations. Patient may be eligible for Low income subsidy. Task sent care team to follow up with patient and assist with LIS.   Billee Cashing, CMA Clinical Pharmacist Assistant [phone]

## 2023-05-05 ENCOUNTER — Telehealth: Payer: Self-pay

## 2023-05-05 ENCOUNTER — Other Ambulatory Visit: Payer: Self-pay | Admitting: Family Medicine

## 2023-05-05 ENCOUNTER — Telehealth: Payer: Self-pay | Admitting: Cardiology

## 2023-05-05 NOTE — Progress Notes (Signed)
05-05-2023: Contacted patient to inform him that London Pepper was denied to exceeding  income limit. Patient stated he only needed to be approved for either Jardiance or farxiga not both. Patient was approved for farxiga and has received medication.  Huey Romans [facility] Clinical Pharmacist Assistant [phone]

## 2023-05-05 NOTE — Telephone Encounter (Signed)
Pt c/o medication issue:  1. Name of Medication: farxiga  2. How are you currently taking this medication (dosage and times per day)?   3. Are you having a reaction (difficulty breathing--STAT)? No  4. What is your medication issue? Patient's spouse stated they picked up this medication from the pharmacy for the patient to start taking. Patient's spouse was told by the pharmacist that she needs to speak with the doctor before taking this medication to make sure the patient can take it while taking his other medications. Patient's spouse has questions in regards to this medication before taking it. Please advise.

## 2023-05-05 NOTE — Telephone Encounter (Signed)
Spoke with the patient's wife who states that the patient has a prescription for farxiga. She states that they discussed this medication with Perlie Gold, PA when they were here earlier this year and he mentioned needing to make a change to another medication if Marcelline Deist was started. Patient's wife states she can't remember which medication it was. Will check with PharmD.

## 2023-05-05 NOTE — Progress Notes (Unsigned)
Paul [name], male    DOB: [birth year]    MRN: [id number]   Brief patient profile:  53 yowm MM/Quit smoking  02/03/09  with GOLD III spirometry baseline doe x able to golf. Only using 02 at  [facility] with  maint on symbicort/ spiriva and no chronic pred/ albuterol twice daily at most  much worse mid March 2020.   PFT 05/29/16: FVC 2.08 L (86%) FEV1 0.83 L (31%) FEV1/FVC 0.40 FEF 25-75 0.30 L (14%) no bronchodilator response TLC 6.56 L (107%) RV 167% ERV 160% DLCO corrected 60% (hemoglobin 10.9) 07/15/13: FVC 2.68 L (60%) FEV1 1.63 L (53%) FEV1/FVC 0.61 FEF 25-75 0.76 L (26%) 02/04/12: FVC 2.18 L (57%) FEV1 1.09 L (36%) FEV1/FVC 0.50 FEF 25-75 0.33 L (11%)   Admit date: 03/19/2019 Discharge date: 03/25/2019   Recommendations for Outpatient Follow-up:  F/u with PCP within a week  for hospital discharge follow up, repeat cbc/bmp at follow up F/u with cardiology for new diagnosis of afib Home 02   Discharge Diagnoses:      Active Hospital Problems    Diagnosis Date Noted   PAF (paroxysmal atrial fibrillation) (HCC)     Acute on chronic respiratory failure with hypoxia (HCC)     COPD exacerbation (HCC) 03/19/2019         Filed Weights    03/23/19 1700 03/24/19 0503 03/25/19 0528  Weight: 60.2 kg 60 kg 62.3 kg      History of present illness: (per admitting MD Dr Lajuana Ripple) PCP: Blane Ohara, MD  Patient coming from: Home   Chief Complaint: Worsening shortness of breath   HPI: [name] is a 77 y.o. male with history h/o COPD, hyperlipidemia who at baseline was using 2 L O2 via nasal cannula only at night and follows [facility] (Dr. Isaiah Serge) as outpatient presents with worsening shortness of breath.  Patient reports that at baseline he has cough with white phlegm and is able to walk around doing his ADLs without shortness of breath and uses O2 only at night.  2 weeks back he developed productive cough with green phlegm and shortness of breath but no fevers.  He underwent CT  chest with contrast on March 18 which was negative for PE or infiltrates.  He apparently was given doxycycline/prednisone course at that time which helped him transiently but symptoms worsened again past Monday with shortness of breath on minimum exertion/cough and he noticed his pulse ox was dropping to 74% .  He started using 2 L O2 nasal cannula continuous.  On Tuesday, he called pulmonary office given persistent symptoms and also pulse oximetry showing O2 sats 84% on exertion and 93% at rest in spite of using continuous O2.  He was advised by Dr. Isaiah Serge to use 60 mg prednisone x3 days followed by 10 mg taper every 3 days.  He was also advised to use nebulizer treatments every 3 hours and continue Symbicort inhaler twice daily.  Patient was supposed to start 50 mg prednisone today but woke up feeling extremely short of breath.  He states he is also been having bad bouts of cough at night with wheezing and bronchospasm.   He presented to [facility] ED where he was noted to be afebrile but hypoxic, required BiPAP for 3 hours, chest x-ray was negative for infiltrates.  Patient denies any recent history of travel or known sick contacts with flu positive or coronavirus positive patients.  He lives at home with his wife who is recovering  from lung cancer and completed chemotherapy 1 month back.   Hospitalist service was called to request direct admission to medical floor but ED to ED transfer facilitated for thorough clinical evaluation prior to requesting medical bed given coronavirus pandemic.  In the ED at [facility], patient has been saturating well on 3 L nasal cannula but appears tachypneic on talking full sentences.  He denies any chest pain, denies any fevers or leg swellings.   Hospital Course:  Active Problems:   COPD exacerbation (HCC)   PAF (paroxysmal atrial fibrillation) (HCC)   Acute on chronic respiratory failure with hypoxia (HCC)     Acute on Hypoxic respiratory failure, COPD  exacerbation, Progression of severe COPD -Emphysema, with no evidence of superimposed acute cardiopulmonary disease -improving on iv steroids, oral zitrhomax, he finished total of 5 days of zithromax in the hospital, he is discharged on slow prednisone taper.  - o2 dropped to 86% while ambulating on room air, discharged on continuous home o2 ( previously on nighttime o2 only) -he is to follow with Dr Isaiah Serge    Paroxsymal afib, New diagnosis of afib -he is started on eliquis (CHADS-VASC 2, age and aortic atherosclerosis), cardizem, lasix -cardiology consulted recommend "Diltiazem CD 360. Avoid amiodarone and beta blockers given his underlying lung dz." -outpatient echo per cardiology     Aortic and multivessel coronary atherosclerosis  - Changed to high intensity statin, atorvastatin 40mg  PO QD. Check lipids and ALT as outpatient in 3 months.   - ASA 81. Aggressive secondary prevention.  - No chest pain.         History of Present Illness  03/29/2019  Pulmonary/ extended post hosp f/u eval/Shakiya Mcneary  Re GOLD III copd ? 02 dep now Chief Complaint  Patient presents with   Hospitalization Follow-up    Breathing has improved some. He has not used his albuterol inhaler or neb since hospital d/c.  Dyspnea:  mb and back ok off 02 sats upper 80s at end  Cough: none Sleep: recliner x 45 degrees = baseline 02 2lpm hs  SABA use: none  Since d/c on symb 160 2bid and spiriva each pm (technique 50% on both, see a/p) On pred 50 mg daily on day as ov rec Plan A = Automatic = symbicort 160 Take 2 puffs first thing in am and then another 2 puffs about 12 hours later.  Spiriva x 2 puffs  right after symbicort  Work on inhaler technique: Plan B = Backup Only use your albuterol inhaler(Proventil/Proair)  as a rescue medication Plan C = Crisis - only use your albuterol nebulizer if you first try Plan B and it fails to help > ok to use the nebulizer up to every 4 hours but if start needing it regularly call  for immediate appointment Plan D = Doctor - call me if B and C not adequate Plan E = ER - go to ER or call 911 if all else fails   02  2lpm at bedtime and adjust to keep it over 90% with activity    02/10/2023  post hosp  f/u ov/Clemma Johnsen re:  copd GOLD 3    maint on breztri and prednisone 20 mg ceiling and 5 mg floor   Chief Complaint  Patient presents with   Follow-up    Breathing improved.  02: 2lpm and titrates as high as 3lpm  Rec No change in medications Make sure you check your oxygen saturation  AT  your highest level of activity (not after you  stop)   to be sure it stays over 90%        05/07/2023  3 m f/u ov/Cherly Erno re: GOLD 3    maint on Breztri and prednisone 10 mg daily   Chief Complaint  Patient presents with   Follow-up    Doing well  Dyspnea:  30 min moderate pace  Cough: none  Sleeping:  7 in bed blocks and cpap machine no resp cc  SABA use: each am neb whether needs it or now  02: 2lpm / not using at rest and freq not using 02 at all during the day but not monitoring ex sats as rec       No obvious day to day or daytime variability or assoc excess/ purulent sputum or mucus plugs or hemoptysis or cp or chest tightness, subjective wheeze or overt sinus or hb symptoms.   Sleeping  without nocturnal  or early am exacerbation  of respiratory  c/o's or need for noct saba. Also denies any obvious fluctuation of symptoms with weather or environmental changes or other aggravating or alleviating factors except as outlined above   No unusual exposure hx or h/o childhood pna/ asthma or knowledge of premature birth.  Current Allergies, Complete Past Medical History, Past Surgical History, Family History, and Social History were reviewed in [employer] record.  ROS  The following are not active complaints unless bolded Hoarseness, sore throat, dysphagia, dental problems, itching, sneezing,  nasal congestion or discharge of excess mucus or purulent  secretions, ear ache,   fever, chills, sweats, unintended wt loss or wt gain, classically pleuritic or exertional cp,  orthopnea pnd or arm/hand swelling  or leg swelling, presyncope, palpitations, abdominal pain, anorexia, nausea, vomiting, diarrhea  or change in bowel habits or change in bladder habits, change in stools or change in urine, dysuria, hematuria,  rash, arthralgias, visual complaints, headache, numbness, weakness or ataxia or problems with walking or coordination,  change in mood or  memory.        Current Meds  Medication Sig   acetaminophen (TYLENOL) 500 MG tablet Take 1,000 mg by mouth at bedtime as needed (pain).   albuterol (PROAIR HFA) 108 (90 Base) MCG/ACT inhaler Inhale 2 puffs into the lungs as needed for wheezing or shortness of breath.   albuterol (PROVENTIL) (2.5 MG/3ML) 0.083% nebulizer solution Take 3 mLs (2.5 mg total) by nebulization every 6 (six) hours as needed for shortness of breath or wheezing.   ALPRAZolam (XANAX) 0.5 MG tablet Take 1 tablet (0.5 mg total) by mouth 2 (two) times daily as needed for anxiety.   amLODipine (NORVASC) 2.5 MG tablet Take 1 tablet (2.5 mg total) by mouth daily.   Ascorbic Acid (VITAMIN C) 1000 MG tablet Take 1,000 mg by mouth daily.   aspirin EC 81 MG tablet Take 1 tablet (81 mg total) by mouth daily. Swallow whole.   atorvastatin (LIPITOR) 40 MG tablet TAKE 1 TABLET BY MOUTH EVERYDAY AT BEDTIME   blood glucose meter kit and supplies KIT Dispense based on patient and insurance preference. Use up to four times daily as directed.   Budeson-Glycopyrrol-Formoterol (BREZTRI AEROSPHERE) 160-9-4.8 MCG/ACT AERO Inhale 2 puffs into the lungs 2 (two) times daily.   calcium carbonate (OS-CAL) 600 MG TABS tablet Take 600 mg by mouth daily.   carvedilol (COREG) 3.125 MG tablet Take 1 tablet (3.125 mg total) by mouth 2 (two) times daily with a meal.   Cholecalciferol (VITAMIN D3) 50 MCG (2000 UT) TABS Take  2,000 Units by mouth in the morning.    dapagliflozin propanediol (FARXIGA) 10 MG TABS tablet Take 10 mg by mouth daily.   denosumab (PROLIA) 60 MG/ML SOSY injection Inject 60 mg into the skin every 6 (six) months.   digoxin (LANOXIN) 0.125 MG tablet Take 1 tablet (0.125 mg total) by mouth daily.   ferrous sulfate 325 (65 FE) MG tablet Take 325 mg by mouth 2 (two) times daily with a meal.   fluticasone (FLONASE) 50 MCG/ACT nasal spray Place 2 sprays into both nostrils daily as needed for allergies or rhinitis.   furosemide (LASIX) 40 MG tablet Take 1 tablet by mouth daily, may take 1 extra tablet daily only as needed for weight gain or lower extremity swelling   loratadine (CLARITIN) 10 MG tablet Take 10 mg by mouth in the morning.   Melatonin 12 MG TABS Take 12 mg by mouth at bedtime.   Multiple Vitamin (MULTIVITAMIN WITH MINERALS) TABS tablet Take 1 tablet by mouth daily with breakfast.   OXYGEN Inhale 2 L/min into the lungs at bedtime.   pantoprazole (PROTONIX) 40 MG tablet Take 1 tablet (40 mg total) by mouth 2 (two) times daily.   predniSONE (DELTASONE) 10 MG tablet TAKE 1 TABLET (10 MG TOTAL) BY MOUTH DAILY WITH BREAKFAST.   Probiotic Product (PROBIOTIC DAILY) CAPS Take 1 capsule by mouth in the morning.   sertraline (ZOLOFT) 50 MG tablet TAKE 1 TABLET BY MOUTH EVERYDAY AT BEDTIME                  Objective:    Wts  05/07/2023       151  02/10/2023       157   03/19/2022       152  05/29/2021          155 02/19/2021        151  10/03/2020      155  02/24/2020         137  01/27/2020         144 10/26/2019        156  07/26/2019          144   06/15/19 136 lb (61.7 kg)  05/29/19 134 lb 7.7 oz (61 kg)  05/28/19 134 lb 3.2 oz (60.9 kg)    Vital signs reviewed  05/07/2023  - Note at rest 02 sats  92% on RA   General appearance:    pleasant amb wm nad     HEENT :  Oropharynx  clear      NECK :  without JVD/Nodes/TM/ nl carotid upstrokes bilaterally   LUNGS: no acc muscle use,  Mod barrel  contour chest wall with  bilateral  Distant bs s audible wheeze and  without cough on insp or exp maneuvers and mod  Hyperresonant  to  percussion bilaterally     CV:  RRR  no s3 or murmur or increase in P2, and no edema   ABD:  soft and nontender with pos mid insp Hoover's  in the supine position. No bruits or organomegaly appreciated, bowel sounds nl  MS:   Ext warm without deformities or   obvious joint restrictions , calf tenderness, cyanosis or clubbing  SKIN: warm and dry without lesions    NEURO:  alert, approp, nl sensorium with  no motor or cerebellar deficits apparent.             I personally reviewed images and agree with radiology impression  as follows:  CXR:   pa and lateral  02/23/23   Hyperinflation. Chronic changes         Assessment

## 2023-05-05 NOTE — Telephone Encounter (Signed)
Spoke with the patient and gave recommendations from Perlie Gold, [place]. Patient verbalized understanding. He will have his PCP check lab work.

## 2023-05-05 NOTE — Telephone Encounter (Signed)
Patient's wife called stating that the patient went to see his cardiologist and they suggested that the patient be taking Marcelline Deist for his CHF which she stated that he is border line diabetic as well and was told by the cardiologist that this would need to be ran by his PCP to see if you wanted to prescribe the medication. Please advise.

## 2023-05-06 ENCOUNTER — Other Ambulatory Visit: Payer: Self-pay

## 2023-05-06 MED ORDER — BLOOD GLUCOSE MONITOR KIT: PACK | 0 refills | Status: DC

## 2023-05-06 NOTE — Telephone Encounter (Signed)
Patient's wife informed, and she stated that Harrold Donath the pharmacist had already gotten patient assistance for the patient and had it approved and sent out to the patient already to start taking. Patient's wife will call us back with name of glucometer that is covered.

## 2023-05-07 ENCOUNTER — Encounter: Payer: Self-pay | Admitting: Internal Medicine

## 2023-05-07 ENCOUNTER — Ambulatory Visit (INDEPENDENT_AMBULATORY_CARE_PROVIDER_SITE_OTHER): Payer: Medicare Other | Admitting: Internal Medicine

## 2023-05-07 ENCOUNTER — Telehealth: Payer: Self-pay | Admitting: Internal Medicine

## 2023-05-07 VITALS — BP 132/62 | HR 83 | Temp 97.9°F | Ht 66.0 in | Wt 151.6 lb

## 2023-05-07 DIAGNOSIS — J449 Chronic obstructive pulmonary disease, unspecified: Secondary | ICD-10-CM | POA: Diagnosis not present

## 2023-05-07 DIAGNOSIS — J9611 Chronic respiratory failure with hypoxia: Secondary | ICD-10-CM | POA: Diagnosis not present

## 2023-05-07 NOTE — Telephone Encounter (Signed)
I could not find a record of how long pt has been on daily prednisone instead of complete taper off - ask pt if he knows roughly when we started using pred this way.

## 2023-05-07 NOTE — Assessment & Plan Note (Signed)
Quit smoking 01/2009  02/04/12 Cleda Daub: FEV1 36% FVC 57% FEF 25 75    11% -05/29/16  FEV1 0.83 L (31%) FEV1/FVC 0.40 FEF 25-75 0.30 L (14%) no bronchodilator response p ? rx prior, DLCO corrected 60% (hemoglobin 10.9) - 03/29/2019   continue symb 160 2bid and spiriva smi 2 puffs each am  - 04/15/2019 placed on daily pred with ceiling 40 and floor of 10 mg daily and empirical rx for GERD - 04/15/2019 flutter valve training  - alpha one screen 06/15/2019  MM  Level 178 - 06/15/2019  After extensive coaching inhaler device,  effectiveness =    90% > so try to taper pred off using ceiling of 20 if worse > 07/26/2019 changed to ceiling of 10 and floor of 5 mg  - 10/26/2019  After extensive coaching inhaler device,  effectiveness =    90% with elipta and still needing pred 20 mg daily so try trelegy sample > consider breztri if prefers hfa and insurance covers  - 12/27/2019 added ppi bid ac otc for atypical spells of sob  - 01/27/2020 changed lopressor to bisoprolol due to refractory wheeze/ sob on lopressor 100 mg daily - Sinus CT  02/03/20 1. Minimal paranasal sinus mucosal thickening, limited to a left ethmoid air cell. No convincing acute sinusitis. 2. Symmetric but mild nasal cavity mucosal thickening, raising the possibility of mild rhinitis. CT chest 02/03/20 1. Coronary artery calcifications are noted suggesting coronary artery disease. 2. Emphysematous disease is noted in the upper lobes bilaterally. 3. Interval development of several old thoracic compression fractures since prior exam. No definite acute fracture is noted.  - 02/24/20 trial of daliresp 250 > could not tol   - 02/19/2021  After extensive coaching inhaler device,  effectiveness =   80% > try change to breztri    Group D (now reclassified as E) in terms of symptom/risk and laba/lama/ICS  therefore appropriate rx at this point >>>  breztri and prednisone 10 mg floor  Would like to work to wan prednisone down to more physiologic levles but for now  appears to be the best he's been in a while so no change rx needed

## 2023-05-07 NOTE — Patient Instructions (Addendum)
Plan A = Automatic = Always=    Breztri Take 2 puffs first thing in am and then another 2 puffs about 12 hours later.    Plan B = Backup (to supplement plan A, not to replace it) Only use your albuterol inhaler as a rescue medication to be used if you can't catch your breath by resting or doing a relaxed purse lip breathing pattern.  - The less you use it, the better it will work when you need it. - Ok to use the inhaler up to 2 puffs  every 4 hours if you must but call for appointment if use goes up over your usual need - Don't leave home without it !!  (think of it like the spare tire for your car)   Plan C = Crisis (instead of Plan B but only if Plan B stops working) - only use your albuterol nebulizer if you first try Plan B and it fails to help > ok to use the nebulizer up to every 4 hours but if start needing it regularly call for immediate appointment   Also  Ok to try albuterol 15 min before an activity (on alternating days)  that you know would usually make you short of breath and see if it makes any difference and if makes none then don't take albuterol after activity unless you can't catch your breath as this means it's the resting that helps, not the albuterol.      Please schedule a follow up visit in 3 months but call sooner if needed to See Rubye Oaks NP

## 2023-05-07 NOTE — Assessment & Plan Note (Signed)
Hs 02 since around 2005 - referred for best fit 05/29/2021  ABG 02/2022- OP ABG -not during flare - ph 7.49. Pco2 40, HCO3 30, Po2 94, O2 sats 98%  Advised: Make sure you check your oxygen saturation  AT  your highest level of activity (not after you stop)   to be sure it stays over 90% and if need 02 then adjust  02 flow upward to maintain this level if needed but remember to turn it back to previous settings when you stop (to conserve your supply).   F/u q 3 m, sooner if needed         Each maintenance medication was reviewed in detail including emphasizing most importantly the difference between maintenance and prns and under what circumstances the prns are to be triggered using an action plan format where appropriate.  Total time for H and P, chart review, counseling, reviewing hfa/neb/02  device(s) and generating customized AVS unique to this office visit / same day charting = 25 min

## 2023-05-08 ENCOUNTER — Telehealth: Payer: Self-pay

## 2023-05-08 NOTE — Progress Notes (Signed)
Care Management & Coordination Services Pharmacy Team  Reason for Encounter: Hypertension  Contacted patient to discuss hypertension disease state. Spoke with patient on 05/08/2023     Current antihypertensive regimen:  Carvedilol 3.125mg  BID Digoxin 0.125mg  daily  Furosemide 40mg  daily prn Amlodipine 2.5mg  daily  Patient verbally confirms he is taking the above medications as directed. Yes  How often are you checking your Blood Pressure? daily  he checks his blood pressure in the morning before taking his medication.  Current home BP readings:  05/05/23 134/64 86 05/06/23 131/68 87 05/07/23 128/63 98 05/08/23 129/70 80  Wrist or arm cuff:Arm  Caffeine intake:None  Salt intake:Limited  OTC medications including pseudoephedrine or NSAIDs? Tylenol prn   Any readings above 180/100? No  What recent interventions/DTPs have been made by any provider to improve Blood Pressure control since last CPP Visit: No recent changes   Any recent hospitalizations or ED visits since last visit with CPP? No  What diet changes have been made to improve Blood Pressure Control?  Low sodium/Carb Diet  What exercise is being done to improve your Blood Pressure Control?  Pt goes to the gym 3 days a week  when its warm outside   Adherence Review: Is the patient currently on ACE/ARB medication? No Does the patient have >5 day gap between last estimated fill dates?   Star Rating Drugs:  Medication:  Last Fill: Day Supply Atorvastatin   02/24/23-11/26/22 90ds Farxiga (PAP)  Received 05/05/23 90ds  Chart Updates: Recent office visits:  05/05/23 Blane Ohara MD. Telephone encounter. Recommend start on farxiga 5 mg daily. Give samples and send prescription.   Recent consult visits:  05/07/23 Sandrea Hughs MD (Pulmonologist) Seen for COPD. No med changes.   04/09/23 Mikeal Hawthorne MD. (Cardiology). Seen for Afib. No med changes.   Hospital visits:  None  Medications: Outpatient Encounter  Medications as of 05/08/2023  Medication Sig   acetaminophen (TYLENOL) 500 MG tablet Take 1,000 mg by mouth at bedtime as needed (pain).   albuterol (PROAIR HFA) 108 (90 Base) MCG/ACT inhaler Inhale 2 puffs into the lungs as needed for wheezing or shortness of breath.   albuterol (PROVENTIL) (2.5 MG/3ML) 0.083% nebulizer solution Take 3 mLs (2.5 mg total) by nebulization every 6 (six) hours as needed for shortness of breath or wheezing.   ALPRAZolam (XANAX) 0.5 MG tablet Take 1 tablet (0.5 mg total) by mouth 2 (two) times daily as needed for anxiety.   amLODipine (NORVASC) 2.5 MG tablet Take 1 tablet (2.5 mg total) by mouth daily.   Ascorbic Acid (VITAMIN C) 1000 MG tablet Take 1,000 mg by mouth daily.   aspirin EC 81 MG tablet Take 1 tablet (81 mg total) by mouth daily. Swallow whole.   atorvastatin (LIPITOR) 40 MG tablet TAKE 1 TABLET BY MOUTH EVERYDAY AT BEDTIME   blood glucose meter kit and supplies KIT Dispense based on patient and insurance preference. Use up to four times daily as directed.   Budeson-Glycopyrrol-Formoterol (BREZTRI AEROSPHERE) 160-9-4.8 MCG/ACT AERO Inhale 2 puffs into the lungs 2 (two) times daily.   calcium carbonate (OS-CAL) 600 MG TABS tablet Take 600 mg by mouth daily.   carvedilol (COREG) 3.125 MG tablet Take 1 tablet (3.125 mg total) by mouth 2 (two) times daily with a meal.   Cholecalciferol (VITAMIN D3) 50 MCG (2000 UT) TABS Take 2,000 Units by mouth in the morning.   dapagliflozin propanediol (FARXIGA) 10 MG TABS tablet Take 10 mg by mouth daily.   denosumab (PROLIA)  60 MG/ML SOSY injection Inject 60 mg into the skin every 6 (six) months.   digoxin (LANOXIN) 0.125 MG tablet Take 1 tablet (0.125 mg total) by mouth daily.   ferrous sulfate 325 (65 FE) MG tablet Take 325 mg by mouth 2 (two) times daily with a meal.   fluticasone (FLONASE) 50 MCG/ACT nasal spray Place 2 sprays into both nostrils daily as needed for allergies or rhinitis.   furosemide (LASIX) 40 MG  tablet Take 1 tablet by mouth daily, may take 1 extra tablet daily only as needed for weight gain or lower extremity swelling   loratadine (CLARITIN) 10 MG tablet Take 10 mg by mouth in the morning.   Melatonin 12 MG TABS Take 12 mg by mouth at bedtime.   Multiple Vitamin (MULTIVITAMIN WITH MINERALS) TABS tablet Take 1 tablet by mouth daily with breakfast.   OXYGEN Inhale 2 L/min into the lungs at bedtime.   pantoprazole (PROTONIX) 40 MG tablet Take 1 tablet (40 mg total) by mouth 2 (two) times daily.   predniSONE (DELTASONE) 10 MG tablet TAKE 1 TABLET (10 MG TOTAL) BY MOUTH DAILY WITH BREAKFAST.   Probiotic Product (PROBIOTIC DAILY) CAPS Take 1 capsule by mouth in the morning.   sertraline (ZOLOFT) 50 MG tablet TAKE 1 TABLET BY MOUTH EVERYDAY AT BEDTIME   No facility-administered encounter medications on file as of 05/08/2023.    Recent Office Vitals: BP Readings from Last 3 Encounters:  05/07/23 132/62  04/09/23 138/62  04/02/23 (!) 130/52   Pulse Readings from Last 3 Encounters:  05/07/23 83  04/09/23 84  04/02/23 82    Wt Readings from Last 3 Encounters:  05/07/23 151 lb 9.6 oz (68.8 kg)  04/09/23 153 lb (69.4 kg)  04/09/23 150 lb (68 kg)     Kidney Function Lab Results  Component Value Date/Time   CREATININE 0.81 03/21/2023 09:29 AM   CREATININE 0.91 02/23/2023 04:06 PM   GFRNONAA >60 02/23/2023 04:06 PM   GFRAA 107 11/20/2020 12:02 PM       Latest Ref Rng & Units 03/21/2023    9:29 AM 02/23/2023    4:06 PM 02/23/2023    3:06 PM  BMP  Glucose 70 - 99 mg/dL 147  829    BUN 8 - 27 mg/dL 10  29    Creatinine 5.62 - 1.27 mg/dL 1.30  8.65    BUN/Creat Ratio 10 - 24 12     Sodium 134 - 144 mmol/L 141  131  130   Potassium 3.5 - 5.2 mmol/L 4.1  3.7  4.7   Chloride 96 - 106 mmol/L 97  87    CO2 20 - 29 mmol/L 25  34    Calcium 8.6 - 10.2 mg/dL 9.9  8.2       Roxana Hires, [facility] Clinical Pharmacist Assistant  [phone]

## 2023-05-08 NOTE — Progress Notes (Signed)
Care Management & Coordination Services Pharmacy Team  Reason for Encounter: Hypertension  05-08-2023: Encounter open in error  Huey Romans [facility] Clinical Pharmacist Assistant [phone]

## 2023-05-12 ENCOUNTER — Emergency Department (HOSPITAL_BASED_OUTPATIENT_CLINIC_OR_DEPARTMENT_OTHER): Payer: Medicare Other

## 2023-05-12 ENCOUNTER — Emergency Department (HOSPITAL_BASED_OUTPATIENT_CLINIC_OR_DEPARTMENT_OTHER)
Admission: EM | Admit: 2023-05-12 | Discharge: 2023-05-12 | Disposition: A | Discharge status: Home / Self Care | Payer: Medicare Other | Attending: Emergency Medicine | Admitting: Emergency Medicine

## 2023-05-12 ENCOUNTER — Other Ambulatory Visit: Payer: Self-pay

## 2023-05-12 ENCOUNTER — Ambulatory Visit: Payer: Self-pay

## 2023-05-12 ENCOUNTER — Encounter (HOSPITAL_BASED_OUTPATIENT_CLINIC_OR_DEPARTMENT_OTHER): Payer: Self-pay | Admitting: Emergency Medicine

## 2023-05-12 ENCOUNTER — Telehealth: Payer: Self-pay

## 2023-05-12 DIAGNOSIS — Z7982 Long term (current) use of aspirin: Secondary | ICD-10-CM | POA: Diagnosis not present

## 2023-05-12 DIAGNOSIS — R0602 Shortness of breath: Secondary | ICD-10-CM | POA: Diagnosis not present

## 2023-05-12 DIAGNOSIS — J441 Chronic obstructive pulmonary disease with (acute) exacerbation: Secondary | ICD-10-CM

## 2023-05-12 LAB — CBC WITH DIFFERENTIAL/PLATELET
Abs Immature Granulocytes: 0.07 10*3/uL (ref 0.00–0.07)
Basophils Absolute: 0 10*3/uL (ref 0.0–0.1)
Basophils Relative: 0 %
Eosinophils Absolute: 0.2 10*3/uL (ref 0.0–0.5)
Eosinophils Relative: 2 %
HCT: 39.2 % (ref 39.0–52.0)
Hemoglobin: 11.8 g/dL — ABNORMAL LOW (ref 13.0–17.0)
Immature Granulocytes: 1 %
Lymphocytes Relative: 11 %
Lymphs Abs: 1.3 10*3/uL (ref 0.7–4.0)
MCH: 25.5 pg — ABNORMAL LOW (ref 26.0–34.0)
MCHC: 30.1 g/dL (ref 30.0–36.0)
MCV: 84.8 fL (ref 80.0–100.0)
Monocytes Absolute: 0.6 10*3/uL (ref 0.1–1.0)
Monocytes Relative: 5 %
Neutro Abs: 9 10*3/uL — ABNORMAL HIGH (ref 1.7–7.7)
Neutrophils Relative %: 81 %
Platelets: 328 10*3/uL (ref 150–400)
RBC: 4.62 MIL/uL (ref 4.22–5.81)
RDW: 14.5 % (ref 11.5–15.5)
WBC: 11.2 10*3/uL — ABNORMAL HIGH (ref 4.0–10.5)
nRBC: 0 % (ref 0.0–0.2)

## 2023-05-12 LAB — BASIC METABOLIC PANEL
Anion gap: 11 (ref 5–15)
BUN: 23 mg/dL (ref 8–23)
CO2: 30 mmol/L (ref 22–32)
Calcium: 8.9 mg/dL (ref 8.9–10.3)
Chloride: 97 mmol/L — ABNORMAL LOW (ref 98–111)
Creatinine, Ser: 0.92 mg/dL (ref 0.61–1.24)
GFR, Estimated: >60 mL/min (ref >60)
Glucose, Bld: 142 mg/dL — ABNORMAL HIGH (ref 70–99)
Potassium: 3.8 mmol/L (ref 3.5–5.1)
Sodium: 138 mmol/L (ref 135–145)

## 2023-05-12 LAB — BRAIN NATRIURETIC PEPTIDE: B Natriuretic Peptide: 100.8 pg/mL — ABNORMAL HIGH (ref 0.0–100.0)

## 2023-05-12 LAB — TROPONIN I (HIGH SENSITIVITY): Troponin I (High Sensitivity): 6 ng/L (ref <18)

## 2023-05-12 MED ORDER — IPRATROPIUM-ALBUTEROL 0.5-2.5 (3) MG/3ML IN SOLN
3.0000 mL | Freq: Once | RESPIRATORY_TRACT | Status: AC
  Administered 2023-05-12: 3 mL via RESPIRATORY_TRACT
  Filled 2023-05-12: qty 3

## 2023-05-12 MED ORDER — ALBUTEROL SULFATE HFA 108 (90 BASE) MCG/ACT IN AERS: 2.0000 | INHALATION_SPRAY | RESPIRATORY_TRACT | Status: DC | PRN

## 2023-05-12 MED ORDER — PREDNISONE 20 MG PO TABS: 60.0000 mg | ORAL_TABLET | Freq: Every day | ORAL | 0 refills | Status: AC

## 2023-05-12 MED ORDER — AZITHROMYCIN 250 MG PO TABS: 250.0000 mg | ORAL_TABLET | Freq: Every day | ORAL | 0 refills | Status: DC

## 2023-05-12 MED ORDER — METHYLPREDNISOLONE SODIUM SUCC 125 MG IJ SOLR
125.0000 mg | Freq: Once | INTRAMUSCULAR | Status: AC
  Administered 2023-05-12: 125 mg via INTRAVENOUS
  Filled 2023-05-12: qty 2

## 2023-05-12 NOTE — ED Triage Notes (Signed)
Shortness of breath , presents on oxygen 2 L [place], reports uses O2 as needed, productive cough . RT at bedside in triage . Hx COPD .

## 2023-05-12 NOTE — Patient Outreach (Signed)
  Care Coordination   Follow Up Visit Note   05/12/2023 Name: Paul [name] MRN: [id number] DOB: 07-05-1946  Paul [name] is a 78 y.o. year old male who sees Cox, Kirsten, MD for primary care. I spoke with  Paul [name] by phone today.  What matters to the patients health and wellness today?  Follow up call with patient today.  Patient is doing very very well. Reports that he continues to follow his low salt diet. Reports that she continues to use his CPAP at night. Reports improved energy level and feels more rested when he wakes up.  States he is taking his medications as prescribed without difficulty. Reports that he has gotten some patient assistance and this is very helpful.  States he is followed closely with [facility] via phone about every 2-3 week.  Reports that he is able to go to the gym 3 times a week with his wife. Reports following his low salt diet has made a big impact in managing his health.  Reports weight range of 148-150 pounds.   Denies any new concerns or problems today.   Goals Addressed               This Visit's Progress     Improve my breathinhg (pt-stated)          Interventions Today    Flowsheet Row Most Recent Value  Chronic Disease   Chronic disease during today's visit Chronic Obstructive Pulmonary Disease (COPD), Congestive Heart Failure (CHF)  General Interventions   General Interventions Discussed/Reviewed General Interventions Reviewed, Doctor Visits  Doctor Visits Discussed/Reviewed Doctor Visits Discussed, Doctor Visits Reviewed, PCP, Specialist  PCP/Specialist Visits Compliance with follow-up visit  Exercise Interventions   Exercise Discussed/Reviewed Exercise Reviewed, Physical Activity  Physical Activity Discussed/Reviewed Physical Activity Reviewed, Types of exercise, Home Exercise Program (HEP)  Education Interventions   Education Provided Provided Education  [Encouraged patient to continue to follow his low salt diet and weigh daily.    Reviewed when to call MD.]  Provided Verbal Education On Nutrition, Labs, Exercise, Medication, When to see the doctor  Nutrition Interventions   Nutrition Discussed/Reviewed Nutrition Reviewed, Decreasing salt  Pharmacy Interventions   Pharmacy Dicussed/Reviewed Pharmacy Topics Discussed, Medications and their functions, Affording Medications  Safety Interventions   Safety Discussed/Reviewed Fall Risk      Extensive review of recent MD visit.  Encouraged patient to keep up the great work in self managing his health. Follow up appointment scheduled. Encouraged patient to call me if needed sooner than our scheduled appointment.         SDOH assessments and interventions completed:  No     Care Coordination Interventions:  Yes, provided   Follow up plan: Follow up call scheduled for 06/23/2023    Encounter Outcome:  Pt. Visit Completed   Paul Pavy, RN, BSN, CEN [facility] Coordinator [phone]

## 2023-05-12 NOTE — ED Provider Notes (Signed)
[facility] Provider Note   CSN: [id number] Arrival date & time: 05/12/23  1557     History  Chief Complaint  Patient presents with   Shortness of Breath    Paul [name] is a 77 y.o. male.  Patient here with shortness of breath the last few days.  History of COPD.  On oxygen as needed.  On chronic steroids.  Denies any cough or sputum production.  Denies any fevers or chills.  Breathing treatments have helped at home.  No chest pain or leg swelling.  Does not endorse any weakness or numbness or tingling.  Denies any headache.  The history is provided by the patient.       Home Medications Prior to Admission medications   Medication Sig Start Date End Date Taking? Authorizing Provider  azithromycin (ZITHROMAX) 250 MG tablet Take 1 tablet (250 mg total) by mouth daily. Take first 2 tablets together, then 1 every day until finished. 05/12/23  Yes Catarina Huntley, DO  predniSONE (DELTASONE) 20 MG tablet Take 3 tablets (60 mg total) by mouth daily for 5 days. 05/12/23 05/17/23 Yes Vihan Santagata, DO  acetaminophen (TYLENOL) 500 MG tablet Take 1,000 mg by mouth at bedtime as needed (pain).    [provider]  albuterol (PROAIR HFA) 108 (90 Base) MCG/ACT inhaler Inhale 2 puffs into the lungs as needed for wheezing or shortness of breath.    [provider]  albuterol (PROVENTIL) (2.5 MG/3ML) 0.083% nebulizer solution Take 3 mLs (2.5 mg total) by nebulization every 6 (six) hours as needed for shortness of breath or wheezing. 10/23/22   Parrett, Virgel Bouquet, NP  ALPRAZolam (XANAX) 0.5 MG tablet Take 1 tablet (0.5 mg total) by mouth 2 (two) times daily as needed for anxiety. 07/25/22   Cox, Fritzi Mandes, MD  amLODipine (NORVASC) 2.5 MG tablet Take 1 tablet (2.5 mg total) by mouth daily. 02/21/23 02/16/24  Hollice Espy, MD  Ascorbic Acid (VITAMIN C) 1000 MG tablet Take 1,000 mg by mouth daily.    [provider]  aspirin EC 81 MG  tablet Take 1 tablet (81 mg total) by mouth daily. Swallow whole. 01/28/22   Filbert Schilder, NP  atorvastatin (LIPITOR) 40 MG tablet TAKE 1 TABLET BY MOUTH EVERYDAY AT BEDTIME 02/24/23   CoxFritzi Mandes, MD  blood glucose meter kit and supplies KIT Dispense based on patient and insurance preference. Use up to four times daily as directed. 05/06/23   Cox, Fritzi Mandes, MD  Budeson-Glycopyrrol-Formoterol (BREZTRI AEROSPHERE) 160-9-4.8 MCG/ACT AERO Inhale 2 puffs into the lungs 2 (two) times daily. 01/02/23   Cox, Fritzi Mandes, MD  calcium carbonate (OS-CAL) 600 MG TABS tablet Take 600 mg by mouth daily.    [provider]  carvedilol (COREG) 3.125 MG tablet Take 1 tablet (3.125 mg total) by mouth 2 (two) times daily with a meal. 02/11/23 02/06/24  Flossie Dibble, NP  Cholecalciferol (VITAMIN D3) 50 MCG (2000 UT) TABS Take 2,000 Units by mouth in the morning.    [provider]  dapagliflozin propanediol (FARXIGA) 10 MG TABS tablet Take 10 mg by mouth daily.    [provider]  denosumab (PROLIA) 60 MG/ML SOSY injection Inject 60 mg into the skin every 6 (six) months. 03/11/23   CoxFritzi Mandes, MD  digoxin (LANOXIN) 0.125 MG tablet Take 1 tablet (0.125 mg total) by mouth daily. 02/08/23 05/09/23  [place], Alvira Philips, DO  ferrous sulfate 325 (65 FE) MG tablet Take 325  mg by mouth 2 (two) times daily with a meal.    [provider]  fluticasone (FLONASE) 50 MCG/ACT nasal spray Place 2 sprays into both nostrils daily as needed for allergies or rhinitis.    [provider]  furosemide (LASIX) 40 MG tablet Take 1 tablet by mouth daily, may take 1 extra tablet daily only as needed for weight gain or lower extremity swelling    [provider]  loratadine (CLARITIN) 10 MG tablet Take 10 mg by mouth in the morning.    [provider]  Melatonin 12 MG TABS Take 12 mg by mouth at bedtime.    [provider]  Multiple Vitamin (MULTIVITAMIN WITH MINERALS) TABS tablet  Take 1 tablet by mouth daily with breakfast.    [provider]  OXYGEN Inhale 2 L/min into the lungs at bedtime.    [provider]  pantoprazole (PROTONIX) 40 MG tablet Take 1 tablet (40 mg total) by mouth 2 (two) times daily. 11/06/22   Nyoka Cowden, MD  predniSONE (DELTASONE) 10 MG tablet TAKE 1 TABLET (10 MG TOTAL) BY MOUTH DAILY WITH BREAKFAST. 03/31/23   Nyoka Cowden, MD  Probiotic Product (PROBIOTIC DAILY) CAPS Take 1 capsule by mouth in the morning.    [provider]  sertraline (ZOLOFT) 50 MG tablet TAKE 1 TABLET BY MOUTH EVERYDAY AT BEDTIME 03/17/23   Cox, Kirsten, MD      Allergies    Tape, Daliresp [roflumilast], Levaquin [levofloxacin], and Alendronate    Review of Systems   Review of Systems  Physical Exam Updated Vital Signs BP (!) 149/73 (BP Location: Left Arm)   Pulse 90   Temp 98.1 F (36.7 C) (Oral)   Resp (!) 24   Wt 69 kg   SpO2 97%   BMI 24.55 kg/m  Physical Exam Vitals and nursing note reviewed.  Constitutional:      General: He is not in acute distress.    Appearance: He is well-developed.  HENT:     Head: Normocephalic and atraumatic.  Eyes:     Conjunctiva/sclera: Conjunctivae normal.     Pupils: Pupils are equal, round, and reactive to light.  Cardiovascular:     Rate and Rhythm: Normal rate and regular rhythm.     Heart sounds: Normal heart sounds. No murmur heard. Pulmonary:     Effort: Pulmonary effort is normal. No respiratory distress.     Breath sounds: Wheezing present.  Abdominal:     Palpations: Abdomen is soft.     Tenderness: There is no abdominal tenderness.  Musculoskeletal:        General: No swelling.     Cervical back: Normal range of motion and neck supple.  Skin:    General: Skin is warm and dry.     Capillary Refill: Capillary refill takes less than 2 seconds.  Neurological:     General: No focal deficit present.     Mental Status: He is alert.  Psychiatric:        Mood and Affect: Mood  normal.     ED Results / Procedures / Treatments   Labs (all labs ordered are listed, but only abnormal results are displayed) Labs Reviewed  CBC WITH DIFFERENTIAL/PLATELET - Abnormal; Notable for the following components:      Result Value   WBC 11.2 (*)    Hemoglobin 11.8 (*)    MCH 25.5 (*)    Neutro Abs 9.0 (*)    All other components within  normal limits  BASIC METABOLIC PANEL - Abnormal; Notable for the following components:   Chloride 97 (*)    Glucose, Bld 142 (*)    All other components within normal limits  BRAIN NATRIURETIC PEPTIDE - Abnormal; Notable for the following components:   B Natriuretic Peptide 100.8 (*)    All other components within normal limits  TROPONIN I (HIGH SENSITIVITY)    EKG EKG Interpretation  Date/Time:  Monday May 12 2023 16:30:54 EDT Ventricular Rate:  84 PR Interval:  129 QRS Duration: 86 QT Interval:  351 QTC Calculation: 415 R Axis:   80 Text Interpretation: Sinus rhythm Confirmed by Virgina Norfolk ([id number]) on 05/12/2023 4:58:05 PM  Radiology DG Chest 2 View  Result Date: 05/12/2023 CLINICAL DATA:  Shortness of breath.  History of COPD. EXAM: CHEST - 2 VIEW COMPARISON:  Chest radiographs 02/23/2023 and 02/15/2023; CTA chest 02/04/2023 FINDINGS: Cardiac silhouette and mediastinal contours are within limits. Moderate calcification within the aortic arch. Flattening of the diaphragms and mild hyperinflation. No pleural effusion pneumothorax. Moderate to severe multilevel degenerative disc changes of the thoracic spine. IMPRESSION: 1. No acute cardiopulmonary process. 2. 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 Electronically Signed   By: Neita Garnet M.D.   On: 05/12/2023 16:57    Procedures Procedures    Medications Ordered in ED Medications  albuterol (VENTOLIN HFA) 108 (90 Base) MCG/ACT inhaler 2 puff (has no administration in time range)  ipratropium-albuterol (DUONEB) 0.5-2.5 (3) MG/3ML  nebulizer solution 3 mL (3 mLs Nebulization Given 05/12/23 1652)  methylPREDNISolone sodium succinate (SOLU-MEDROL) 125 mg/2 mL injection 125 mg (125 mg Intravenous Given 05/12/23 1711)    ED Course/ Medical Decision Making/ A&P                             Medical Decision Making Amount and/or Complexity of Data Reviewed Labs: ordered. Radiology: ordered.  Risk Prescription drug management.   Paul [name] is here with shortness of breath.  History of COPD, A-fib.  He is on oxygen.  Overall he appears well.  Has a little bit of wheezing but no increased work of breathing.  He feels like his COPD symptoms slightly worse last few days.  He is on chronic 10 mg of prednisone.  Denies any leg swelling or fever or chills.  Differential diagnosis likely mild COPD exacerbation, less likely ACS or volume overload.  Possibly infectious process.  Will check CBC, BMP, troponin, BNP, chest x-ray.  EKG shows sinus rhythm.  No ischemic changes.  Will give DuoNeb treatment, IV Solu-Medrol.  Lab work per my review and interpretation unremarkable.  No significant anemia or electrolyte abnormality or kidney injury.  BNP and troponin unremarkable.  Chest x-ray without evidence of pneumonia per my review and interpretation.  No signs of volume overload.  Overall suspect COPD exacerbation.  Will increase his prednisone for few days and prescribe Z-Pak.  Recommend follow-up with primary care doctor.  Discharged in good condition.  This chart was dictated using voice recognition software.  Despite best efforts to proofread,  errors can occur which can change the documentation meaning.         Final Clinical Impression(s) / ED Diagnoses Final diagnoses:  COPD exacerbation (HCC)    Rx / DC Orders ED Discharge Orders          Ordered    predniSONE (DELTASONE) 20 MG tablet  Daily  05/12/23 1803    azithromycin (ZITHROMAX) 250 MG tablet  Daily        05/12/23 1803              Virgina Norfolk, DO 05/12/23 1805

## 2023-05-12 NOTE — Telephone Encounter (Signed)
Pt called today to request a same day appointment for the following symptoms:neck pain-symptoms started a few days ago. Patient stated that his neck is sore. Unfortunately, our schedule is full and we have no openings between today or tomorrow. Pt was notified to go to Urgent Care.

## 2023-05-12 NOTE — Discharge Instructions (Addendum)
Take 60 mg of predisone for the next 5 days and then go back to 10 mg daily. Take zpak as prescribed

## 2023-05-13 ENCOUNTER — Telehealth: Payer: Self-pay

## 2023-05-13 ENCOUNTER — Telehealth: Payer: Self-pay | Admitting: Cardiology

## 2023-05-13 MED ORDER — DIGOXIN 125 MCG PO TABS: 0.1250 mg | ORAL_TABLET | Freq: Every day | ORAL | 3 refills | Status: DC

## 2023-05-13 NOTE — Telephone Encounter (Signed)
Appointment has been scheduled.

## 2023-05-13 NOTE — Telephone Encounter (Signed)
Dr. Sedalia Muta,   When and where can we schedule this patient?  Patient notified that we do not have an opening within the timeframe that he needs to be seen and will follow-up with the provider for further advisement.  Hospital: [facility]-emergency department Follow-up within: unknown

## 2023-05-13 NOTE — Telephone Encounter (Signed)
Call patient to see if he can come in tomorrow at 11:20 AM to see Dr Sedalia Muta, Patient could not hear me stated he will call back.

## 2023-05-13 NOTE — Telephone Encounter (Signed)
Pt's medication was sent to pt's pharmacy as requested. Confirmation received.  °

## 2023-05-13 NOTE — Telephone Encounter (Signed)
*  STAT* If patient is at the pharmacy, call can be transferred to refill team.   1. Which medications need to be refilled? (please list name of each medication and dose if known) digoxin (LANOXIN) 0.125 MG tablet   2. Which pharmacy/location (including street and city if local pharmacy) is medication to be sent to? [id number] - [place],  - [address]   3. Do they need a 30 day or 90 day supply? 90 day  Patient only has 3 pills left.

## 2023-05-14 ENCOUNTER — Ambulatory Visit (INDEPENDENT_AMBULATORY_CARE_PROVIDER_SITE_OTHER): Payer: Medicare Other | Admitting: Family Medicine

## 2023-05-14 VITALS — BP 132/62 | HR 78 | Temp 96.0°F | Resp 20 | Ht 66.0 in | Wt 153.6 lb

## 2023-05-14 DIAGNOSIS — J441 Chronic obstructive pulmonary disease with (acute) exacerbation: Secondary | ICD-10-CM

## 2023-05-14 NOTE — Patient Instructions (Signed)
Complete antibiotic and prednisone 60 mg. Resume prednisone 10 mg daily after 60 mg is completed.

## 2023-05-14 NOTE — Progress Notes (Signed)
Subjective:  Patient ID: Paul [name], male    DOB: Oct 28, 1946  Age: 77 y.o. MRN: [id number]  Chief Complaint  Patient presents with   Follow-up   COPD    HPI Paul [name] comes in for ED-follow-up.  He was seen at [facility] on May 20. He was treated for COPD exacerbation.  He was sent home on prednisone 60 mg daily for 5 days and azithromycin. He was then to return to prednisone 10 mg daily.       05/14/2023   11:23 AM 04/01/2023   11:15 AM 03/26/2023    8:54 AM 11/22/2022    9:35 AM 08/16/2022    8:20 AM  Depression screen PHQ 2/9  Decreased Interest 0 0 0 0 0  Down, Depressed, Hopeless 0 0 0 0 0  PHQ - 2 Score 0 0 0 0 0        05/14/2023   11:23 AM  Fall Risk   Falls in the past year? 1  Number falls in past yr: 1  Injury with Fall? 0  Risk for fall due to : History of fall(s)  Follow up Falls evaluation completed;Falls prevention discussed    Patient Care Team: Blane Ohara, MD as PCP - General (Family Medicine) Jake Bathe, MD as PCP - Cardiology (Cardiology) Lanier Prude, MD as PCP - Electrophysiology (Cardiology) Storm Frisk, MD as Attending Physician (Pulmonary Disease) Nyoka Cowden, MD as Consulting Physician (Pulmonary Disease) [facility] as Registered Nurse [facility] and Palliative Medicine) Zettie Pho, [facility] (Pharmacist) Rockne Menghini, RN as [facility] Care Management   Review of Systems  Constitutional:  Positive for fatigue. Negative for chills and fever.  HENT:  Negative for congestion, rhinorrhea and sore throat.   Respiratory:  Positive for cough, shortness of breath and wheezing.   Cardiovascular:  Negative for chest pain and palpitations.  Gastrointestinal:  Negative for abdominal pain, constipation, diarrhea, nausea and vomiting.  Genitourinary:  Negative for dysuria and urgency.  Musculoskeletal:  Positive for neck pain and neck stiffness. Negative for arthralgias, back  pain and myalgias.  Neurological:  Negative for dizziness and headaches.  Psychiatric/Behavioral:  Negative for dysphoric mood. The patient is not nervous/anxious.     Current Outpatient Medications on File Prior to Visit  Medication Sig Dispense Refill   acetaminophen (TYLENOL) 500 MG tablet Take 1,000 mg by mouth at bedtime as needed (pain).     albuterol (PROAIR HFA) 108 (90 Base) MCG/ACT inhaler Inhale 2 puffs into the lungs as needed for wheezing or shortness of breath.     albuterol (PROVENTIL) (2.5 MG/3ML) 0.083% nebulizer solution Take 3 mLs (2.5 mg total) by nebulization every 6 (six) hours as needed for shortness of breath or wheezing. 360 mL 5   ALPRAZolam (XANAX) 0.5 MG tablet Take 1 tablet (0.5 mg total) by mouth 2 (two) times daily as needed for anxiety. 30 tablet 0   amLODipine (NORVASC) 2.5 MG tablet Take 1 tablet (2.5 mg total) by mouth daily. 90 tablet 3   Ascorbic Acid (VITAMIN C) 1000 MG tablet Take 1,000 mg by mouth daily.     aspirin EC 81 MG tablet Take 1 tablet (81 mg total) by mouth daily. Swallow whole. 90 tablet 3   atorvastatin (LIPITOR) 40 MG tablet TAKE 1 TABLET BY MOUTH EVERYDAY AT BEDTIME 90 tablet 1   azithromycin (ZITHROMAX) 250 MG tablet Take 1 tablet (250 mg total) by mouth daily.  Take first 2 tablets together, then 1 every day until finished. 6 tablet 0   Budeson-Glycopyrrol-Formoterol (BREZTRI AEROSPHERE) 160-9-4.8 MCG/ACT AERO Inhale 2 puffs into the lungs 2 (two) times daily. 3 each 3   calcium carbonate (OS-CAL) 600 MG TABS tablet Take 600 mg by mouth daily.     carvedilol (COREG) 3.125 MG tablet Take 1 tablet (3.125 mg total) by mouth 2 (two) times daily with a meal. 180 tablet 3   Cholecalciferol (VITAMIN D3) 50 MCG (2000 UT) TABS Take 2,000 Units by mouth in the morning.     dapagliflozin propanediol (FARXIGA) 10 MG TABS tablet Take 10 mg by mouth daily.     denosumab (PROLIA) 60 MG/ML SOSY injection Inject 60 mg into the skin every 6 (six) months. 180  mL 0   digoxin (LANOXIN) 0.125 MG tablet Take 1 tablet (0.125 mg total) by mouth daily. 90 tablet 3   ferrous sulfate 325 (65 FE) MG tablet Take 325 mg by mouth 2 (two) times daily with a meal.     fluticasone (FLONASE) 50 MCG/ACT nasal spray Place 2 sprays into both nostrils daily as needed for allergies or rhinitis.     furosemide (LASIX) 40 MG tablet Take 1 tablet by mouth daily, may take 1 extra tablet daily only as needed for weight gain or lower extremity swelling     loratadine (CLARITIN) 10 MG tablet Take 10 mg by mouth in the morning.     Melatonin 12 MG TABS Take 12 mg by mouth at bedtime.     Multiple Vitamin (MULTIVITAMIN WITH MINERALS) TABS tablet Take 1 tablet by mouth daily with breakfast.     OXYGEN Inhale 2 L/min into the lungs at bedtime.     pantoprazole (PROTONIX) 40 MG tablet Take 1 tablet (40 mg total) by mouth 2 (two) times daily. 180 tablet 3   Probiotic Product (PROBIOTIC DAILY) CAPS Take 1 capsule by mouth in the morning.     sertraline (ZOLOFT) 50 MG tablet TAKE 1 TABLET BY MOUTH EVERYDAY AT BEDTIME 90 tablet 1   blood glucose meter kit and supplies KIT Dispense based on patient and insurance preference. Use up to four times daily as directed. 1 each 0   predniSONE (DELTASONE) 10 MG tablet TAKE 1 TABLET (10 MG TOTAL) BY MOUTH DAILY WITH BREAKFAST. (Patient not taking: Reported on 05/14/2023) 90 tablet 0   No current facility-administered medications on file prior to visit.   Past Medical History:  Diagnosis Date   Anemia    Aortic atherosclerosis (HCC)    Aspiration pneumonia due to inhalation of vomitus (HCC) 02/03/2022   Atrial fibrillation (HCC)    Back pain    COPD (chronic obstructive pulmonary disease) (HCC)    COPD exacerbation (HCC) 12/27/2021   COVID-19 12/24/2020   Diabetes mellitus without complication (HCC)    Dizziness 04/08/2022   Elevated PSA    GAD (generalized anxiety disorder)    GERD (gastroesophageal reflux disease)    High cholesterol     Hypertension    Lingular pneumonia 01/05/2021   See cxr 01/06/20 rx with zpak/ cefipime x 5 days and then developed purulent sputum again 01/12/21 so rx levcaquin 500 mg daily x 7 days then return for cxr before more    Lung nodule < 6cm on CT 05/29/2016   Osteoporosis    Oxygen deficiency    Pneumonia    January 2022   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 Lalla Brothers with a 20 mm Watchman FLX device   RSV bronchitis 01/27/2022   Vertebral compression fracture (HCC) 05/29/2019   T8 compression fracture noted on CT scan 05/28/2019 inpatient with chronic steroid dependent COPD - rx calcitonin nasal spray rx per PCP    Past Surgical History:  Procedure Laterality Date   CATARACT EXTRACTION Right    ELBOW SURGERY  07/2022   surgery on olecranon bursa. Dr. Yehuda Budd   IR KYPHO EA ADDL LEVEL THORACIC OR LUMBAR  11/28/2021   LEFT ATRIAL APPENDAGE OCCLUSION N/A 08/02/2021   Procedure: LEFT ATRIAL APPENDAGE OCCLUSION;  Surgeon: Tonny Bollman, MD;  Location: [facility] INVASIVE CV LAB;  Service: Cardiovascular;  Laterality: N/A;   SKIN SURGERY     shoulders and chest   TEE WITHOUT CARDIOVERSION N/A 08/02/2021   Procedure: TRANSESOPHAGEAL ECHOCARDIOGRAM (TEE);  Surgeon: Tonny Bollman, MD;  Location: [facility] INVASIVE CV LAB;  Service: Cardiovascular;  Laterality: N/A;   TEE WITHOUT CARDIOVERSION N/A 09/13/2021   Procedure: TRANSESOPHAGEAL ECHOCARDIOGRAM (TEE);  Surgeon: Thurmon Fair, MD;  Location: [facility] ENDOSCOPY;  Service: Cardiovascular;  Laterality: N/A;    Family History  Problem Relation Age of Onset   Heart disease Brother    Heart disease Mother    Heart disease Father    Cancer Paternal Uncle    Social History   Socioeconomic History   Marital status: Married    Spouse name: Not on file   Number of children: 3   Years of education: Not on file   Highest education level: Not on file  Occupational History   Occupation: retired    Comment: truck  driver  Tobacco Use   Smoking status: Former    Packs/day: 2.00    Years: 52.00    Additional pack years: 0.00    Total pack years: 104.00    Types: Cigarettes    Quit date: 02/03/2009    Years since quitting: 14.2   Smokeless tobacco: Never   Tobacco comments:    Counseled to remain smoke free  Vaping Use   Vaping Use: Never used  Substance and Sexual Activity   Alcohol use: Not Currently    Alcohol/week: 0.0 standard drinks of alcohol    Comment: occ   Drug use: No   Sexual activity: Not on file  Other Topics Concern   Not on file  Social History Narrative   Originally from [place]. Previously worked driving a Surveyor, minerals. No pets currently. No mold exposure. 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       wears sunscreen, brushes and flosses daily, see's dentist bi-annually, has smoke/carbon monoxide detectors, wears a seatbelt and practices gun safety      Social Determinants of Health   Financial Resource Strain: Low Risk  (04/01/2023)   Overall Financial Resource Strain (CARDIA)    Difficulty of Paying Living Expenses: Not very hard  Food Insecurity: No Food Insecurity (02/20/2023)   Hunger Vital Sign    Worried About Running Out of Food in the Last Year: Never true    Ran Out of Food in the Last Year: Never true  Transportation Needs: No Transportation Needs (02/20/2023)   PRAPARE - Administrator, Civil Service (Medical): No    Lack of Transportation (Non-Medical): No  Physical Activity: Insufficiently Active (04/01/2023)   Exercise Vital Sign    Days of Exercise per Week: 3 days    Minutes of Exercise per  Session: 30 min  Stress: No Stress Concern Present (02/15/2022)   [employer] of Occupational Health - Occupational Stress Questionnaire    Feeling of Stress : Only a little  Social Connections: Socially Integrated (02/15/2022)   Social Connection and Isolation Panel [NHANES]    Frequency  of Communication with Friends and Family: More than three times a week    Frequency of Social Gatherings with Friends and Family: More than three times a week    Attends Religious Services: More than 4 times per year    Active Member of [employer] or Organizations: Yes    Attends Engineer, structural: More than 4 times per year    Marital Status: Married    Objective:  BP 132/62   Pulse 78   Temp (!) 96 F (35.6 C)   Resp 20   Ht 5\' 6"  (1.676 m)   Wt 153 lb 9.6 oz (69.7 kg)   SpO2 98%   BMI 24.79 kg/m      05/14/2023   11:20 AM 05/12/2023    6:00 PM 05/12/2023    4:10 PM  BP/Weight  Systolic BP 132 160 149  Diastolic BP 62 62 73  Wt. (Lbs) 153.6    BMI 24.79 kg/m2      Physical Exam Constitutional:      Appearance: Normal appearance.  HENT:     Nose: No congestion or rhinorrhea.     Mouth/Throat:     Pharynx: No oropharyngeal exudate or posterior oropharyngeal erythema.  Cardiovascular:     Rate and Rhythm: Normal rate and regular rhythm.     Heart sounds: Normal heart sounds.  Pulmonary:     Effort: Pulmonary effort is normal. No respiratory distress.     Breath sounds: Normal breath sounds. No wheezing, rhonchi or rales.  Abdominal:     General: Bowel sounds are normal.     Palpations: Abdomen is soft.     Tenderness: There is no abdominal tenderness.  Musculoskeletal:     Comments: Neck tender.  Limited ROM of neck particularly with turning neck to the right.   Lymphadenopathy:     Cervical: No cervical adenopathy.  Neurological:     Mental Status: He is alert.  Psychiatric:        Mood and Affect: Mood normal.        Behavior: Behavior normal.     Diabetic Foot Exam - Simple   No data filed      Lab Results  Component Value Date   WBC 11.2 (H) 05/12/2023   HGB 11.8 (L) 05/12/2023   HCT 39.2 05/12/2023   PLT 328 05/12/2023   GLUCOSE 142 (H) 05/12/2023   CHOL 167 03/21/2023   TRIG 156 (H) 03/21/2023   HDL 76 03/21/2023   LDLCALC 65  03/21/2023   ALT 42 03/21/2023   AST 27 03/21/2023   NA 138 05/12/2023   K 3.8 05/12/2023   CL 97 (L) 05/12/2023   CREATININE 0.92 05/12/2023   BUN 23 05/12/2023   CO2 30 05/12/2023   TSH 0.792 01/25/2023   INR 0.9 01/25/2023   HGBA1C 6.6 (H) 03/21/2023   MICROALBUR Negative 10/09/2021      Assessment & Plan:    COPD with acute exacerbation (HCC) Assessment & Plan: Complete antibiotic and prednisone 60 mg. Resume prednisone 10 mg daily after 60 mg is completed.      No orders of the defined types were placed in this encounter.   No orders of the  defined types were placed in this encounter.    Follow-up: Return if symptoms worsen or fail to improve.   I,Carolyn M Morrison,acting as a Neurosurgeon for Blane Ohara, MD.,have documented all relevant documentation on the behalf of Blane Ohara, MD,as directed by  Blane Ohara, MD while in the presence of Blane Ohara, MD.   Paul [name],acting as a scribe for Blane Ohara, MD.,have documented all relevant documentation on the behalf of Blane Ohara, MD,as directed by  Blane Ohara, MD while in the presence of Blane Ohara, MD.    An After Visit Summary was printed and given to the patient.  Blane Ohara, MD [facility] [phone]

## 2023-05-15 NOTE — Assessment & Plan Note (Signed)
Complete antibiotic and prednisone 60 mg. Resume prednisone 10 mg daily after 60 mg is completed. 

## 2023-05-20 ENCOUNTER — Ambulatory Visit: Payer: Medicare Other | Admitting: Internal Medicine

## 2023-05-26 NOTE — Telephone Encounter (Signed)
Called patient.  He states he has been on daily prednisone for about 3 years.

## 2023-05-27 ENCOUNTER — Telehealth: Payer: Self-pay

## 2023-05-27 NOTE — Progress Notes (Signed)
Care Management & Coordination Services Pharmacy Team  Reason for Encounter: Appointment Reminder  Contacted patient to confirm telephone appointment with Artelia Laroche, PharmD on 05/29/23 at 2:00 pm.  Spoke with patient on 05/27/2023   Do you have any problems getting your medications? No  What is your top health concern you would like to discuss at your upcoming visit? Denies top concerns   Have you seen any other providers since your last visit with PCP? Yes, Dr. Sherene Sires, his pulmonologist.    Chart review:  Recent office visits:  05/14/23 Blane Ohara MD. Seen for follow up. No med changes.   Recent consult visits:  None  Hospital visits:  Medication Reconciliation was completed by comparing discharge summary, patient's EMR and Pharmacy list, and upon discussion with patient.  Admitted to the hospital on 05/12/23 due to COPD Exacerbation. Discharge date was 05/12/23. Discharged from [facility].    New?Medications Started at [facility] Discharge:?? -started Azithromycin 250mg  and Prednisone 60mg  daily    Medication Changes at Hospital Discharge: -Changed None  Medications Discontinued at Hospital Discharge: -Stopped None  Medications that remain the same after Hospital Discharge:??  -All other medications will remain the same.     Star Rating Drugs:  Medication:  Last Fill: Day Supply Atorvastatin   02/24/23-11/26/22  90ds Farxiga (PAP)  LF May 90ds   Care Gaps: Annual wellness visit in last year? Yes Dexa Scan: Never done Last eye exam / retinopathy screening:Never done   Roxana Hires, [facility] Clinical Pharmacist Assistant  [phone]

## 2023-05-28 DIAGNOSIS — S46811A Strain of other muscles, fascia and tendons at shoulder and upper arm level, right arm, initial encounter: Secondary | ICD-10-CM | POA: Diagnosis not present

## 2023-05-28 DIAGNOSIS — S161XXA Strain of muscle, fascia and tendon at neck level, initial encounter: Secondary | ICD-10-CM | POA: Diagnosis not present

## 2023-05-29 ENCOUNTER — Emergency Department (HOSPITAL_BASED_OUTPATIENT_CLINIC_OR_DEPARTMENT_OTHER): Payer: Medicare Other

## 2023-05-29 ENCOUNTER — Ambulatory Visit: Payer: Medicare Other

## 2023-05-29 ENCOUNTER — Encounter (HOSPITAL_BASED_OUTPATIENT_CLINIC_OR_DEPARTMENT_OTHER): Payer: Self-pay | Admitting: Emergency Medicine

## 2023-05-29 ENCOUNTER — Emergency Department (HOSPITAL_BASED_OUTPATIENT_CLINIC_OR_DEPARTMENT_OTHER)
Admission: EM | Admit: 2023-05-29 | Discharge: 2023-05-29 | Disposition: A | Discharge status: Home / Self Care | Payer: Medicare Other | Attending: Emergency Medicine | Admitting: Emergency Medicine

## 2023-05-29 ENCOUNTER — Other Ambulatory Visit: Payer: Self-pay

## 2023-05-29 DIAGNOSIS — M25462 Effusion, left knee: Secondary | ICD-10-CM | POA: Diagnosis not present

## 2023-05-29 DIAGNOSIS — S61422A Laceration with foreign body of left hand, initial encounter: Secondary | ICD-10-CM | POA: Diagnosis not present

## 2023-05-29 DIAGNOSIS — J432 Centrilobular emphysema: Secondary | ICD-10-CM | POA: Diagnosis not present

## 2023-05-29 DIAGNOSIS — J449 Chronic obstructive pulmonary disease, unspecified: Secondary | ICD-10-CM | POA: Diagnosis not present

## 2023-05-29 DIAGNOSIS — Z79899 Other long term (current) drug therapy: Secondary | ICD-10-CM | POA: Diagnosis not present

## 2023-05-29 DIAGNOSIS — W19XXXA Unspecified fall, initial encounter: Secondary | ICD-10-CM

## 2023-05-29 DIAGNOSIS — Z043 Encounter for examination and observation following other accident: Secondary | ICD-10-CM | POA: Diagnosis not present

## 2023-05-29 DIAGNOSIS — R9431 Abnormal electrocardiogram [ECG] [EKG]: Secondary | ICD-10-CM | POA: Diagnosis not present

## 2023-05-29 DIAGNOSIS — W01198A Fall on same level from slipping, tripping and stumbling with subsequent striking against other object, initial encounter: Secondary | ICD-10-CM | POA: Diagnosis not present

## 2023-05-29 DIAGNOSIS — S81022A Laceration with foreign body, left knee, initial encounter: Secondary | ICD-10-CM | POA: Insufficient documentation

## 2023-05-29 DIAGNOSIS — S50312A Abrasion of left elbow, initial encounter: Secondary | ICD-10-CM | POA: Diagnosis not present

## 2023-05-29 DIAGNOSIS — Z7982 Long term (current) use of aspirin: Secondary | ICD-10-CM | POA: Insufficient documentation

## 2023-05-29 DIAGNOSIS — I509 Heart failure, unspecified: Secondary | ICD-10-CM | POA: Insufficient documentation

## 2023-05-29 DIAGNOSIS — S61412A Laceration without foreign body of left hand, initial encounter: Secondary | ICD-10-CM

## 2023-05-29 DIAGNOSIS — Y92093 Driveway of other non-institutional residence as the place of occurrence of the external cause: Secondary | ICD-10-CM | POA: Insufficient documentation

## 2023-05-29 DIAGNOSIS — S0182XA Laceration with foreign body of other part of head, initial encounter: Secondary | ICD-10-CM | POA: Insufficient documentation

## 2023-05-29 DIAGNOSIS — Y9301 Activity, walking, marching and hiking: Secondary | ICD-10-CM | POA: Diagnosis not present

## 2023-05-29 DIAGNOSIS — R600 Localized edema: Secondary | ICD-10-CM | POA: Diagnosis not present

## 2023-05-29 DIAGNOSIS — I110 Hypertensive heart disease with heart failure: Secondary | ICD-10-CM | POA: Diagnosis not present

## 2023-05-29 DIAGNOSIS — S01412A Laceration without foreign body of left cheek and temporomandibular area, initial encounter: Secondary | ICD-10-CM | POA: Diagnosis not present

## 2023-05-29 DIAGNOSIS — S01422A Laceration with foreign body of left cheek and temporomandibular area, initial encounter: Secondary | ICD-10-CM | POA: Diagnosis not present

## 2023-05-29 DIAGNOSIS — S0181XA Laceration without foreign body of other part of head, initial encounter: Secondary | ICD-10-CM

## 2023-05-29 DIAGNOSIS — Z7951 Long term (current) use of inhaled steroids: Secondary | ICD-10-CM | POA: Insufficient documentation

## 2023-05-29 DIAGNOSIS — S81012A Laceration without foreign body, left knee, initial encounter: Secondary | ICD-10-CM

## 2023-05-29 DIAGNOSIS — S0993XA Unspecified injury of face, initial encounter: Secondary | ICD-10-CM | POA: Diagnosis present

## 2023-05-29 LAB — BASIC METABOLIC PANEL
Anion gap: 12 (ref 5–15)
BUN: 20 mg/dL (ref 8–23)
CO2: 27 mmol/L (ref 22–32)
Calcium: 8.2 mg/dL — ABNORMAL LOW (ref 8.9–10.3)
Chloride: 93 mmol/L — ABNORMAL LOW (ref 98–111)
Creatinine, Ser: 1.06 mg/dL (ref 0.61–1.24)
GFR, Estimated: >60 mL/min (ref >60)
Glucose, Bld: 122 mg/dL — ABNORMAL HIGH (ref 70–99)
Potassium: 4.3 mmol/L (ref 3.5–5.1)
Sodium: 132 mmol/L — ABNORMAL LOW (ref 135–145)

## 2023-05-29 LAB — CBC
HCT: 36.5 % — ABNORMAL LOW (ref 39.0–52.0)
Hemoglobin: 11.2 g/dL — ABNORMAL LOW (ref 13.0–17.0)
MCH: 25.4 pg — ABNORMAL LOW (ref 26.0–34.0)
MCHC: 30.7 g/dL (ref 30.0–36.0)
MCV: 82.8 fL (ref 80.0–100.0)
Platelets: 277 10*3/uL (ref 150–400)
RBC: 4.41 MIL/uL (ref 4.22–5.81)
RDW: 14.4 % (ref 11.5–15.5)
WBC: 12.2 10*3/uL — ABNORMAL HIGH (ref 4.0–10.5)
nRBC: 0 % (ref 0.0–0.2)

## 2023-05-29 MED ORDER — LIDOCAINE-EPINEPHRINE-TETRACAINE (LET) TOPICAL GEL
3.0000 mL | Freq: Once | TOPICAL | Status: AC
  Administered 2023-05-29: 3 mL via TOPICAL
  Filled 2023-05-29: qty 3

## 2023-05-29 MED ORDER — LIDOCAINE-EPINEPHRINE (PF) 2 %-1:200000 IJ SOLN
10.0000 mL | Freq: Once | INTRAMUSCULAR | Status: AC
  Administered 2023-05-29: 10 mL
  Filled 2023-05-29: qty 20

## 2023-05-29 MED ORDER — HYDROCODONE-ACETAMINOPHEN 5-325 MG PO TABS
1.0000 | ORAL_TABLET | Freq: Once | ORAL | Status: AC
  Administered 2023-05-29: 1 via ORAL
  Filled 2023-05-29: qty 1

## 2023-05-29 MED ORDER — HYDROCODONE-ACETAMINOPHEN 5-325 MG PO TABS: 1.0000 | ORAL_TABLET | Freq: Four times a day (QID) | ORAL | 0 refills | Status: DC | PRN

## 2023-05-29 NOTE — ED Notes (Signed)
Vital signs stable. 

## 2023-05-29 NOTE — ED Triage Notes (Signed)
Pt reports he was walking in his driveway became shob/lightheaded and fell. Pt states he did hit his head. Abrasions to left knee, left hand, left elbow, left shoulder and left side of face. Pt also c/o left sided rib pain.   No blood thinners. No loc.

## 2023-05-29 NOTE — Discharge Instructions (Signed)
As we discussed, your workup in the ER today was reassuring for acute findings.  We did repair several lacerations today which need to be kept clean and dry and your dressing changes need to be changed once a day.   We have closed your laceration(s) with absorbable sutures, Steri-Strips, and skin glue.  Please follow-up with your primary doctor for a wound check in the next few days.  If any of the sutures, glue, or Steri-Strips come out before it is time for removal, that is okay. Make sure to keep the area as clean and dry as possible. You can let warm soapy warm run over the area, but do NOT scrub it.   Watch out for signs of infection, like we discussed, including: increased redness, tenderness, or drainage of pus from the area. If this happens and you have not been prescribed an antibiotic, please seek medical attention for possible infection.   You can take over the counter pain medicine like ibuprofen or tylenol as needed.  I have also given you a prescription for Vicodin which is a narcotic for you to take as prescribed as needed for severe pain only.  Do not drive or operate heavy machinery while taking this medication as it can be sedating and be careful because it can also increase your fall risk.  Additionally, given you have some rib pain on the left side I recommend using your incentive spirometer (the blower) routinely to help prevent any developing pneumonia and ensure you are taking a deep breath at all times.  Return if development of any new or worsening symptoms.

## 2023-05-29 NOTE — ED Notes (Signed)
ED Provider at bedside.  ED provider Smoot applied LET to the areas of need with gauze covering for Pt. To go to Radiology.  Pt. In no distress and complains of no pain.  Wife at bedside of Pt.

## 2023-05-29 NOTE — ED Notes (Signed)
Pt. Had a fall today causing skin tears to the L Hand L knee L shoulder L arm L Cheek L eyebrow  Bleeding is controlled.

## 2023-05-29 NOTE — ED Notes (Signed)
All skin tears were dressed and cleaned.  No bleeding noted at time of discharge.  Pt. Alert and oriented.

## 2023-05-29 NOTE — ED Provider Notes (Signed)
[facility] Provider Note   CSN: [id number] Arrival date & time: 05/29/23  1319     History  Chief Complaint  Patient presents with   Paul [name] is a 77 y.o. male.  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  Patient states that same occurred immediately prior to arrival today when he stood up to walk out to his driveway and felt lightheaded tried to make it back to his chair but was unable to and fell to the ground in his driveway. He hit his head but did not loose consciousness. He is not anticoagulated.  Endorses wounds to his left cheek, left shoulder, left elbow, left hand, and left knee. Also endorses pain to the left ribcage with some pain with deep inspiration. He states that he feels like he just stood up too quickly, denies any headache, vision changes, chest pain, shortness of breath, nausea, vomiting, diarrhea, hematochezia, or melena.  States that currently he feels back to his baseline.  He is up-to-date on his tetanus vaccine.   The history is provided by the patient. No language interpreter was used.  Fall       Home Medications Prior to Admission medications   Medication Sig Start Date End Date Taking? Authorizing Provider  acetaminophen (TYLENOL) 500 MG tablet Take 1,000 mg by mouth at bedtime as needed (pain).    [provider]  albuterol (PROAIR HFA) 108 (90 Base) MCG/ACT inhaler Inhale 2 puffs into the lungs as needed for wheezing or shortness of breath.    [provider]  albuterol (PROVENTIL) (2.5 MG/3ML) 0.083% nebulizer solution Take 3 mLs (2.5 mg total) by nebulization every 6 (six) hours as needed for shortness of breath or wheezing. 10/23/22   Parrett, Virgel Bouquet, NP  ALPRAZolam (XANAX) 0.5 MG tablet Take 1 tablet (0.5 mg total) by mouth 2 (two) times daily as needed for  anxiety. 07/25/22   Cox, Fritzi Mandes, MD  amLODipine (NORVASC) 2.5 MG tablet Take 1 tablet (2.5 mg total) by mouth daily. 02/21/23 02/16/24  Hollice Espy, MD  Ascorbic Acid (VITAMIN C) 1000 MG tablet Take 1,000 mg by mouth daily.    [provider]  aspirin EC 81 MG tablet Take 1 tablet (81 mg total) by mouth daily. Swallow whole. 01/28/22   Filbert Schilder, NP  atorvastatin (LIPITOR) 40 MG tablet TAKE 1 TABLET BY MOUTH EVERYDAY AT BEDTIME 02/24/23   Cox, Kirsten, MD  azithromycin (ZITHROMAX) 250 MG tablet Take 1 tablet (250 mg total) by mouth daily. Take first 2 tablets together, then 1 every day until finished. 05/12/23   Curatolo, Adam, DO  blood glucose meter kit and supplies KIT Dispense based on patient and insurance preference. Use up to four times daily as directed. 05/06/23   Cox, Fritzi Mandes, MD  Budeson-Glycopyrrol-Formoterol (BREZTRI AEROSPHERE) 160-9-4.8 MCG/ACT AERO Inhale 2 puffs into the lungs 2 (two) times daily. 01/02/23   Cox, Fritzi Mandes, MD  calcium carbonate (OS-CAL) 600 MG TABS tablet Take 600 mg by mouth daily.    [provider]  carvedilol (COREG) 3.125 MG tablet Take 1 tablet (3.125 mg total) by mouth 2 (two) times daily with a meal. 02/11/23 02/06/24  Flossie Dibble, NP  Cholecalciferol (VITAMIN D3) 50 MCG (2000 UT) TABS Take 2,000 Units by mouth in the morning.    [provider]  dapagliflozin propanediol (FARXIGA) 10 MG TABS tablet Take 10 mg by mouth daily.    [provider]  denosumab (PROLIA) 60 MG/ML SOSY injection Inject 60 mg into the skin every 6 (six) months. 03/11/23   CoxFritzi Mandes, MD  digoxin (LANOXIN) 0.125 MG tablet Take 1 tablet (0.125 mg total) by mouth daily. 05/13/23   Jake Bathe, MD  ferrous sulfate 325 (65 FE) MG tablet Take 325 mg by mouth 2 (two) times daily with a meal.    [provider]  fluticasone (FLONASE) 50 MCG/ACT nasal spray Place 2 sprays into both nostrils daily as needed for allergies or rhinitis.     [provider]  furosemide (LASIX) 40 MG tablet Take 1 tablet by mouth daily, may take 1 extra tablet daily only as needed for weight gain or lower extremity swelling    [provider]  loratadine (CLARITIN) 10 MG tablet Take 10 mg by mouth in the morning.    [provider]  Melatonin 12 MG TABS Take 12 mg by mouth at bedtime.    [provider]  Multiple Vitamin (MULTIVITAMIN WITH MINERALS) TABS tablet Take 1 tablet by mouth daily with breakfast.    [provider]  OXYGEN Inhale 2 L/min into the lungs at bedtime.    [provider]  pantoprazole (PROTONIX) 40 MG tablet Take 1 tablet (40 mg total) by mouth 2 (two) times daily. 11/06/22   Nyoka Cowden, MD  predniSONE (DELTASONE) 10 MG tablet TAKE 1 TABLET (10 MG TOTAL) BY MOUTH DAILY WITH BREAKFAST. Patient not taking: Reported on 05/14/2023 03/31/23   Nyoka Cowden, MD  Probiotic Product (PROBIOTIC DAILY) CAPS Take 1 capsule by mouth in the morning.    [provider]  sertraline (ZOLOFT) 50 MG tablet TAKE 1 TABLET BY MOUTH EVERYDAY AT BEDTIME 03/17/23   Cox, Fritzi Mandes, MD      Allergies    Tape, Daliresp [roflumilast], Levaquin [levofloxacin], and Alendronate    Review of Systems   Review of Systems  Musculoskeletal:  Positive for arthralgias and myalgias.  Neurological:  Positive for light-headedness.  All other systems reviewed and are negative.   Physical Exam Updated Vital Signs BP (!) 156/75   Pulse 79   Temp 98.1 F (36.7 C) (Oral)   Resp (!) 24   Ht 5\' 6"  (1.676 m)   Wt 70 kg   SpO2 100%   BMI 24.91 kg/m  Physical Exam Vitals and nursing note reviewed.  Constitutional:      General: He is not in acute distress.    Appearance: Normal appearance. He is normal weight. He is not ill-appearing, toxic-appearing or diaphoretic.  HENT:     Head: Normocephalic and atraumatic.     Comments: No Battle sign or raccoon eyes  4 cm laceration noted over the left  zygomatic arch. No signs of foreign body.  No active bleeding.  Facial muscles intact. Eyes:     Extraocular Movements: Extraocular movements intact.     Pupils: Pupils are equal, round, and reactive to light.  Neck:     Comments: No tenderness to palpation of cervical spine Cardiovascular:     Rate and Rhythm: Normal rate and regular rhythm.     Heart sounds: Normal heart sounds.  Pulmonary:     Effort: Pulmonary effort is normal. No respiratory distress.     Breath sounds: Normal breath sounds.     Comments: On 2 L oxygen via nasal cannula per baseline. Chest:  Comments: TTP over the left lateral chest.  No crepitus or deformity.  No bruising, swelling, or overlying skin changes. Abdominal:     General: Abdomen is flat.     Palpations: Abdomen is soft.     Tenderness: There is no abdominal tenderness.  Musculoskeletal:        General: Normal range of motion.     Cervical back: Normal range of motion and neck supple.     Comments: No tenderness to palpation of the thoracic or lumbar spine.  Skin:    General: Skin is warm and dry.     Comments: 6 cm laceration with skin tear encompassing most of the dorsal aspect of the left hand into the webspace between the middle and ring finger. ROM intact. No underlying injury or obvious deformity. No signs of foreign body. Capillary refill less than 2 seconds. Compartments soft.   Skin tear noted to the lateral aspect of the left elbow.  No active bleeding or signs of foreign body.  No obvious deformity.  ROM intact.  Radial pulse intact and 2+.  Small superficial skin tear noted to the humeral head of the left shoulder.  No obvious deformity or active bleeding.  No signs of foreign body.  4 cm laceration with associated skin tear noted to the anterior left knee just above the patella.  ROM intact.  No obvious deformity.  DP and PT pulses intact and 2+.  No active bleeding.  Compartments soft.  Neurological:     General: No focal deficit  present.     Mental Status: He is alert and oriented to person, place, and time.  Psychiatric:        Mood and Affect: Mood normal.        Behavior: Behavior normal.     ED Results / Procedures / Treatments   Labs (all labs ordered are listed, but only abnormal results are displayed) Labs Reviewed  CBC - Abnormal; Notable for the following components:      Result Value   WBC 12.2 (*)    Hemoglobin 11.2 (*)    HCT 36.5 (*)    MCH 25.4 (*)    All other components within normal limits  BASIC METABOLIC PANEL - Abnormal; Notable for the following components:   Sodium 132 (*)    Chloride 93 (*)    Glucose, Bld 122 (*)    Calcium 8.2 (*)    All other components within normal limits    EKG EKG Interpretation  Date/Time:  Thursday May 29 2023 13:52:18 EDT Ventricular Rate:  75 PR Interval:  135 QRS Duration: 81 QT Interval:  358 QTC Calculation: 400 R Axis:   65 Text Interpretation: Sinus rhythm No significant change since last tracing Confirmed by Gwyneth Sprout ([id number]) on 05/29/2023 2:13:21 PM  Radiology CT Chest Wo Contrast  Result Date: 05/29/2023 CLINICAL DATA:  Chest trauma EXAM: CT CHEST WITHOUT CONTRAST TECHNIQUE: Multidetector CT imaging of the chest was performed following the standard protocol without IV contrast. RADIATION DOSE REDUCTION: This exam was performed according to the departmental dose-optimization program which includes automated exposure control, adjustment of the mA and/or kV according to patient size and/or use of iterative reconstruction technique. COMPARISON:  CTA chest dated February thirteenth 2024 FINDINGS: Cardiovascular: Normal heart size. No pericardial effusion. Normal caliber thoracic aorta with moderate calcified plaque. Severe coronary artery calcifications. Left atrial occlusion device. Mediastinum/Nodes: Esophagus and thyroid are unremarkable. No enlarged lymph nodes seen in the chest. Lungs/Pleura: Central airways are  patent. Severe  centrilobular emphysema. Unchanged biapical pleural-parenchymal scarring. Stable small solid pulmonary nodule of the right upper lobe measuring 3 mm on series 4 image 51, unchanged when compared with 2021 prior, no follow-up imaging is necessary given greater than 1 year stability. 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 No pleural effusion. No pneumothorax. Upper Abdomen: No acute abnormality. Musculoskeletal: Old right lateral and anterior left rib fractures. Moderate to severe compression fracture deformities at T4, T5, T7, and T8, chronic. Prior vertebral augmentation at L1. No acute osseous abnormality. IMPRESSION: 1. No evidence of acute traumatic injury of the chest. 2. Aortic atherosclerosis (ICD10-I70.0) and Emphysema (ICD10-J43.9). Electronically Signed   By: Allegra Lai M.D.   On: 05/29/2023 17:07   DG Hand Complete Left  Result Date: 05/29/2023 CLINICAL DATA:  Fall today. EXAM: LEFT HAND - COMPLETE 3+ VIEW COMPARISON:  None Available. FINDINGS: Previous amputation of the second, third, and fourth fingers. No acute fracture. Normal alignment without dislocation. Soft tissue edema overlies the dorsum of the metacarpal phalangeal joints. No erosive change. IMPRESSION: 1. Soft tissue edema without acute fracture or dislocation. 2. Previous amputation of the second, third, and fourth fingers. Electronically Signed   By: Narda Rutherford M.D.   On: 05/29/2023 15:04   DG Elbow Complete Left  Result Date: 05/29/2023 CLINICAL DATA:  Fall today. EXAM: LEFT ELBOW - COMPLETE 3+ VIEW COMPARISON:  None Available. FINDINGS: There is no evidence of fracture, dislocation, or joint effusion. Mild degenerative spurring and olecranon spur. Soft tissues are unremarkable. IMPRESSION: No fracture or subluxation of the left elbow. Mild degenerative spurring and olecranon spur. Electronically Signed   By:  Narda Rutherford M.D.   On: 05/29/2023 15:03   DG Knee Complete 4 Views Left  Result Date: 05/29/2023 CLINICAL DATA:  Fall today. EXAM: LEFT KNEE - COMPLETE 4+ VIEW COMPARISON:  None Available. FINDINGS: No fracture or dislocation. 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 Minimal knee joint effusion. Small quadriceps tendon enthesophyte. There are vascular calcifications. IMPRESSION: 1. No fracture or dislocation of the left knee. 2. 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 Electronically Signed   By: Narda Rutherford M.D.   On: 05/29/2023 15:01   DG Ribs Unilateral W/Chest Left  Result Date: 05/29/2023 CLINICAL DATA:  Fall today. EXAM: LEFT RIBS AND CHEST - 3+ VIEW COMPARISON:  Chest radiograph 05/12/2023 FINDINGS: Suspected nondisplaced left anterior ninth rib fracture cortical buckling. There is no evidence of pneumothorax or pleural effusion. Both lungs are clear. Heart size and mediastinal contours are within normal limits. Left atrial appendage occlusion device. IMPRESSION: Suspected nondisplaced left anterior ninth rib fracture. No pulmonary complication. Electronically Signed   By: Narda Rutherford M.D.   On: 05/29/2023 15:01   DG Shoulder Left  Result Date: 05/29/2023 CLINICAL DATA:  Fall today. EXAM: LEFT SHOULDER - 2+ VIEW COMPARISON:  None Available. FINDINGS: There is no evidence of fracture or dislocation. Mild glenohumeral degenerative change with glenoid spurring. Mild acromioclavicular degenerative change. No erosions or bone destruction. Soft tissues are unremarkable. IMPRESSION: 1. No fracture or dislocation of the left shoulder. 2. Mild osteoarthritis. Electronically Signed   By: Narda Rutherford M.D.   On: 05/29/2023 14:58   CT Head Wo Contrast  Result Date: 05/29/2023 CLINICAL DATA:  Fall EXAM: CT HEAD WITHOUT CONTRAST CT MAXILLOFACIAL WITHOUT CONTRAST CT CERVICAL SPINE WITHOUT CONTRAST TECHNIQUE: Multidetector CT imaging of  the head, cervical  spine, and maxillofacial structures were performed using the standard protocol without intravenous contrast. Multiplanar CT image reconstructions of the cervical spine and maxillofacial structures were also generated. RADIATION DOSE REDUCTION: This exam was performed according to the departmental dose-optimization program which includes automated exposure control, adjustment of the mA and/or kV according to patient size and/or use of iterative reconstruction technique. COMPARISON:  None Available. FINDINGS: CT HEAD FINDINGS Brain: No evidence of acute infarction, hemorrhage, hydrocephalus, extra-axial collection or mass lesion/mass effect. Vascular: No hyperdense vessel or unexpected calcification. CT FACIAL BONES FINDINGS Skull: Normal. Negative for fracture or focal lesion. Facial bones: No displaced fractures or dislocations. Sinuses/Orbits: No acute finding. Other: Soft tissue contusion and laceration of the left cheek and temple (series 2, image 5). CT CERVICAL SPINE FINDINGS Alignment: Straightening of the normal cervical lordosis. Skull base and vertebrae: No acute fracture. No primary bone lesion or focal pathologic process. Soft tissues and spinal canal: No prevertebral fluid or swelling. No visible canal hematoma. Disc levels: Focally moderate disc space height loss and osteophytosis of C5-C6 with otherwise generally preserved disc spaces. Upper chest: Negative. Other: None. IMPRESSION: 1. No acute intracranial pathology. 2. No displaced fractures or dislocations of the facial bones. 3. Soft tissue contusion and laceration of the left cheek and temple. 4. No fracture or static subluxation of the cervical spine. Electronically Signed   By: Jearld Lesch M.D.   On: 05/29/2023 14:51   CT Maxillofacial Wo Contrast  Result Date: 05/29/2023 CLINICAL DATA:  Fall EXAM: CT HEAD WITHOUT CONTRAST CT MAXILLOFACIAL WITHOUT CONTRAST CT CERVICAL SPINE WITHOUT CONTRAST TECHNIQUE: Multidetector CT imaging of the head,  cervical spine, and maxillofacial structures were performed using the standard protocol without intravenous contrast. Multiplanar CT image reconstructions of the cervical spine and maxillofacial structures were also generated. RADIATION DOSE REDUCTION: This exam was performed according to the departmental dose-optimization program which includes automated exposure control, adjustment of the mA and/or kV according to patient size and/or use of iterative reconstruction technique. COMPARISON:  None Available. FINDINGS: CT HEAD FINDINGS Brain: No evidence of acute infarction, hemorrhage, hydrocephalus, extra-axial collection or mass lesion/mass effect. Vascular: No hyperdense vessel or unexpected calcification. CT FACIAL BONES FINDINGS Skull: Normal. Negative for fracture or focal lesion. Facial bones: No displaced fractures or dislocations. Sinuses/Orbits: No acute finding. Other: Soft tissue contusion and laceration of the left cheek and temple (series 2, image 5). CT CERVICAL SPINE FINDINGS Alignment: Straightening of the normal cervical lordosis. Skull base and vertebrae: No acute fracture. No primary bone lesion or focal pathologic process. Soft tissues and spinal canal: No prevertebral fluid or swelling. No visible canal hematoma. Disc levels: Focally moderate disc space height loss and osteophytosis of C5-C6 with otherwise generally preserved disc spaces. Upper chest: Negative. Other: None. IMPRESSION: 1. No acute intracranial pathology. 2. No displaced fractures or dislocations of the facial bones. 3. Soft tissue contusion and laceration of the left cheek and temple. 4. No fracture or static subluxation of the cervical spine. Electronically Signed   By: Jearld Lesch M.D.   On: 05/29/2023 14:51   CT Cervical Spine Wo Contrast  Result Date: 05/29/2023 CLINICAL DATA:  Fall EXAM: CT HEAD WITHOUT CONTRAST CT MAXILLOFACIAL WITHOUT CONTRAST CT CERVICAL SPINE WITHOUT CONTRAST TECHNIQUE: Multidetector CT imaging of  the head, cervical spine, and maxillofacial structures were performed using the standard protocol without intravenous contrast. Multiplanar CT image reconstructions of the cervical spine and maxillofacial structures were also generated. RADIATION DOSE REDUCTION: This exam was  performed according to the departmental dose-optimization program which includes automated exposure control, adjustment of the mA and/or kV according to patient size and/or use of iterative reconstruction technique. COMPARISON:  None Available. FINDINGS: CT HEAD FINDINGS Brain: No evidence of acute infarction, hemorrhage, hydrocephalus, extra-axial collection or mass lesion/mass effect. Vascular: No hyperdense vessel or unexpected calcification. CT FACIAL BONES FINDINGS Skull: Normal. Negative for fracture or focal lesion. Facial bones: No displaced fractures or dislocations. Sinuses/Orbits: No acute finding. Other: Soft tissue contusion and laceration of the left cheek and temple (series 2, image 5). CT CERVICAL SPINE FINDINGS Alignment: Straightening of the normal cervical lordosis. Skull base and vertebrae: No acute fracture. No primary bone lesion or focal pathologic process. Soft tissues and spinal canal: No prevertebral fluid or swelling. No visible canal hematoma. Disc levels: Focally moderate disc space height loss and osteophytosis of C5-C6 with otherwise generally preserved disc spaces. Upper chest: Negative. Other: None. IMPRESSION: 1. No acute intracranial pathology. 2. No displaced fractures or dislocations of the facial bones. 3. Soft tissue contusion and laceration of the left cheek and temple. 4. No fracture or static subluxation of the cervical spine. Electronically Signed   By: Jearld Lesch M.D.   On: 05/29/2023 14:51    Procedures .Marland KitchenLaceration Repair  Date/Time: 05/29/2023 7:17 PM  Performed by: Silva Bandy, PA-C Authorized by: Silva Bandy, PA-C   Consent:    Consent obtained:  Verbal   Consent given by:   Patient   Risks, benefits, and alternatives were discussed: yes     Risks discussed:  Infection, need for additional repair, nerve damage, poor wound healing, poor cosmetic result, pain, retained foreign body, tendon damage and vascular damage   Alternatives discussed:  No treatment, delayed treatment, observation and referral Universal protocol:    Procedure explained and questions answered to patient or proxy's satisfaction: yes     Imaging studies available: yes     Patient identity confirmed:  Verbally with patient Anesthesia:    Anesthesia method:  Topical application and local infiltration   Topical anesthetic:  LET   Local anesthetic:  Lidocaine 2% WITH epi Laceration details:    Location:  Face   Face location:  L cheek   Length (cm):  4   Depth (mm):  2 Exploration:    Hemostasis achieved with:  LET   Imaging outcome: foreign body not noted     Wound exploration: wound explored through full range of motion and entire depth of wound visualized     Contaminated: yes   Treatment:    Area cleansed with:  Soap and water and saline   Amount of cleaning:  Extensive   Irrigation solution:  Sterile saline   Irrigation volume:  1 L   Irrigation method:  Pressure wash   Visualized foreign bodies/material removed: yes     Debridement:  Moderate Skin repair:    Repair method:  Sutures   Suture size:  5-0   Suture material:  Fast-absorbing gut   Suture technique:  Simple interrupted   Number of sutures:  6 Approximation:    Approximation:  Close Repair type:    Repair type:  Simple Post-procedure details:    Dressing:  Antibiotic ointment and non-adherent dressing   Procedure completion:  Tolerated well, no immediate complications .Marland KitchenLaceration Repair  Date/Time: 05/29/2023 7:19 PM  Performed by: Silva Bandy, PA-C Authorized by: Silva Bandy, PA-C   Consent:    Consent obtained:  Verbal   Consent given by:  Patient   Risks, benefits, and alternatives were discussed:  yes     Risks discussed:  Infection, pain, retained foreign body, tendon damage, vascular damage, poor wound healing, poor cosmetic result, need for additional repair and nerve damage   Alternatives discussed:  No treatment, delayed treatment, observation and referral Universal protocol:    Procedure explained and questions answered to patient or proxy's satisfaction: yes     Patient identity confirmed:  Verbally with patient Anesthesia:    Anesthesia method:  Topical application   Topical anesthetic:  LET Laceration details:    Location:  Hand   Hand location:  L hand, dorsum   Length (cm):  6   Depth (mm):  5 Exploration:    Hemostasis achieved with:  Direct pressure   Imaging obtained: x-ray     Imaging outcome: foreign body not noted     Wound exploration: wound explored through full range of motion and entire depth of wound visualized     Contaminated: yes   Treatment:    Area cleansed with:  Soap and water   Amount of cleaning:  Extensive   Irrigation solution:  Sterile saline   Irrigation volume:  1 L   Irrigation method:  Pressure wash   Visualized foreign bodies/material removed: yes     Debridement:  Moderate Skin repair:    Repair method:  Steri-Strips and tissue adhesive   Number of Steri-Strips:  12 Approximation:    Approximation:  Loose Repair type:    Repair type:  Simple Post-procedure details:    Dressing:  Non-adherent dressing   Procedure completion:  Tolerated well, no immediate complications .Marland KitchenLaceration Repair  Date/Time: 05/29/2023 7:21 PM  Performed by: Silva Bandy, PA-C Authorized by: Silva Bandy, PA-C   Consent:    Consent obtained:  Verbal   Consent given by:  Patient   Risks, benefits, and alternatives were discussed: yes     Risks discussed:  Infection, nerve damage, need for additional repair, poor wound healing, poor cosmetic result, pain, retained foreign body, tendon damage and vascular damage   Alternatives discussed:  No  treatment, delayed treatment, observation and referral Universal protocol:    Procedure explained and questions answered to patient or proxy's satisfaction: yes     Imaging studies available: yes     Patient identity confirmed:  Verbally with patient Anesthesia:    Anesthesia method:  Topical application and local infiltration   Topical anesthetic:  LET   Local anesthetic:  Lidocaine 2% WITH epi Laceration details:    Location:  Leg   Leg location:  L knee   Length (cm):  4   Depth (mm):  4 Exploration:    Hemostasis achieved with:  LET   Imaging obtained: x-ray     Imaging outcome: foreign body not noted     Wound exploration: wound explored through full range of motion and entire depth of wound visualized     Contaminated: yes   Treatment:    Area cleansed with:  Soap and water   Amount of cleaning:  Extensive   Irrigation solution:  Sterile saline   Irrigation volume:  1 L   Irrigation method:  Pressure wash   Visualized foreign bodies/material removed: yes   Skin repair:    Repair method:  Sutures   Suture size:  4-0   Wound skin closure material used: Vicryl Rapid.   Number of sutures:  6 Approximation:    Approximation:  Close Repair type:  Repair type:  Simple Post-procedure details:    Dressing:  Antibiotic ointment and non-adherent dressing   Procedure completion:  Tolerated well, no immediate complications     Medications Ordered in ED Medications  lidocaine-EPINEPHrine-tetracaine (LET) topical gel (3 mLs Topical Given 05/29/23 1645)  lidocaine-EPINEPHrine (XYLOCAINE W/EPI) 2 %-1:200000 (PF) injection 10 mL (10 mLs Infiltration Given 05/29/23 1452)  HYDROcodone-acetaminophen (NORCO/VICODIN) 5-325 MG per tablet 1 tablet (1 tablet Oral Given 05/29/23 1646)    ED Course/ Medical Decision Making/ A&P                             Medical Decision Making Amount and/or Complexity of Data Reviewed Labs: ordered. Radiology: ordered.  Risk Prescription drug  management.   This patient is a 77 y.o. male who presents to the ED for concern of lightheadedness, fall, this involves an extensive number of treatment options, and is a complaint that carries with it a high risk of complications and morbidity. The emergent differential diagnosis prior to evaluation includes, but is not limited to,  arrythmia (Vtach, SVT, SSS, sinus arrest, AV block, bradycardia) aortic stenosis, AMI, HOCM, PE, atrial myxoma, pulmonary hypertension, orthostatic hypotension, (hypovolemia, drug effect, GB syndrome, micturition, cough, swall) carotid sinus sensitivity, Seizure, TIA/CVA, hypoglycemia,  Vertigo . This is not an exhaustive differential.   Past Medical History / Co-morbidities / Social History: history of COPD on 2L [place] at baseline, T2DM, afib status post watchman procedure, hypertension, CHF  Additional history: Chart reviewed. Pertinent results include: patient seen for similar on 03/06/22 and had normal work-up and was discharged home  Physical Exam: Physical exam performed. The pertinent findings include: per above, several wounds noted to the face, left shoulder, left elbow, left hand, and left knee  Lab Tests: I ordered, and personally interpreted labs.  The pertinent results include:  WBC 12.2 likely reactive due to trauma, patient is also on chronic steroids which could be contributing.  Hemoglobin 11.2 consistent with previous.  NA 132, chloride 93   Imaging Studies: I ordered imaging studies including CT head, max/face, c spine, chest. DG ribs, left shoulder, left knee, and left elbow I independently visualized and interpreted imaging which showed   X-ray ribs: Suspected nondisplaced left anterior ninth rib fracture. No pulmonary complication.  All other x-rays negative for acute findings  CT head, face, and neck:   1. No acute intracranial pathology. 2. No displaced fractures or dislocations of the facial bones. 3. Soft tissue contusion and laceration  of the left cheek and temple. 4. No fracture or static subluxation of the cervical spine.  CT chest:  1. No evidence of acute traumatic injury of the chest.   I agree with the radiologist interpretation.   Cardiac Monitoring:  The patient was maintained on a cardiac monitor.  My attending physician Dr. Anitra Lauth viewed and interpreted the cardiac monitored which showed an underlying rhythm of: sinus rhythm. I agree with this interpretation.   Medications: I ordered medication including vicodin  for pain. Reevaluation of the patient after these medicines showed that the patient improved. I have reviewed the patients home medicines and have made adjustments as needed.   Disposition: After consideration of the diagnostic results and the patients response to treatment, I feel that emergency department workup does not suggest an emergent condition requiring admission or immediate intervention beyond what has been performed at this time. The plan is: Discharge with close outpatient follow-up and wound check.  His wounds  have been cleaned and dressed and he has been given materials to do dressing changes at home.  He is having some pain with deep breathing but is able to walk around without any drop in his oxygen saturation or worsening shortness of breath.  He prefers to go home which is reasonable.  Will send provide given to help with pain.  Patient is aware not to drive or operate heavy machinery while taking this medication.  PDMP reviewed.  Also aware of the fall risk associated with narcotics. He has incentive spirometry at home, advised importance of using this regularly given his rib pain. Evaluation and diagnostic testing in the emergency department does not suggest an emergent condition requiring admission or immediate intervention beyond what has been performed at this time.  Plan for discharge with close PCP follow-up.  Patient is understanding and amenable with plan, educated on red flag  symptoms that would prompt immediate return.  Patient discharged in stable condition.   I discussed this case with my attending physician Dr. Anitra Lauth who cosigned this note including patient's presenting symptoms, physical exam, and planned diagnostics and interventions. Attending physician stated agreement with plan or made changes to plan which were implemented.    Final Clinical Impression(s) / ED Diagnoses Final diagnoses:  Fall, initial encounter  Facial laceration, initial encounter  Laceration of left hand without foreign body, initial encounter  Knee laceration, left, initial encounter  Abrasion of left elbow, initial encounter    Rx / DC Orders ED Discharge Orders          Ordered    HYDROcodone-acetaminophen (NORCO/VICODIN) 5-325 MG tablet  Every 6 hours PRN        05/29/23 1903          An After Visit Summary was printed and given to the patient.     Vear Clock 05/29/23 Arlean Hopping, MD 06/02/23 [phone]

## 2023-05-29 NOTE — Patient Outreach (Signed)
  Care Management   Follow Up Note   05/29/2023 Name: Paul [name] MRN: [id number] DOB: 11/18/1946   Referred by: Blane Ohara, MD Reason for referral : Care Coordination   An unsuccessful telephone outreach was attempted today. The patient was referred to the case management team for assistance with care management and care coordination.   Follow Up Plan: The patient has been provided with contact information for the care management team and has been advised to call with any health related questions or concerns.   Artelia Laroche, Pharm.D. - [phone]

## 2023-06-02 ENCOUNTER — Telehealth: Payer: Self-pay

## 2023-06-02 NOTE — Telephone Encounter (Signed)
The pts wife called stating the [name] took a bad fall last week. He was seen at the med center. He has an appointment this week at the wound center. The patient wants to know if he needs to follow-up with our office, if so when?

## 2023-06-02 NOTE — Telephone Encounter (Signed)
Patient stated that he has 6 stiches in his knee and 6 stiches in his face and was told that stiches would dissolve on their own. Patient is going to see wound care Thursday about some skin tares that he had from fall.  Per Marianne Sofia, PA-C: Recommend patient have wound care to call our office after they see him, and after speaking to someone at the wound center we will call patient back if he needs a follow up visit in office.

## 2023-06-05 DIAGNOSIS — L97912 Non-pressure chronic ulcer of unspecified part of right lower leg with fat layer exposed: Secondary | ICD-10-CM | POA: Diagnosis not present

## 2023-06-05 DIAGNOSIS — S0083XD Contusion of other part of head, subsequent encounter: Secondary | ICD-10-CM | POA: Diagnosis not present

## 2023-06-05 DIAGNOSIS — Z7952 Long term (current) use of systemic steroids: Secondary | ICD-10-CM | POA: Diagnosis not present

## 2023-06-05 DIAGNOSIS — L97819 Non-pressure chronic ulcer of other part of right lower leg with unspecified severity: Secondary | ICD-10-CM | POA: Diagnosis not present

## 2023-06-05 DIAGNOSIS — Z87891 Personal history of nicotine dependence: Secondary | ICD-10-CM | POA: Diagnosis not present

## 2023-06-05 DIAGNOSIS — L97822 Non-pressure chronic ulcer of other part of left lower leg with fat layer exposed: Secondary | ICD-10-CM | POA: Diagnosis not present

## 2023-06-05 DIAGNOSIS — Z9981 Dependence on supplemental oxygen: Secondary | ICD-10-CM | POA: Diagnosis not present

## 2023-06-05 DIAGNOSIS — R600 Localized edema: Secondary | ICD-10-CM | POA: Diagnosis not present

## 2023-06-05 DIAGNOSIS — Z87828 Personal history of other (healed) physical injury and trauma: Secondary | ICD-10-CM | POA: Diagnosis not present

## 2023-06-05 DIAGNOSIS — J9610 Chronic respiratory failure, unspecified whether with hypoxia or hypercapnia: Secondary | ICD-10-CM | POA: Diagnosis not present

## 2023-06-05 DIAGNOSIS — Z872 Personal history of diseases of the skin and subcutaneous tissue: Secondary | ICD-10-CM | POA: Diagnosis not present

## 2023-06-05 DIAGNOSIS — L98492 Non-pressure chronic ulcer of skin of other sites with fat layer exposed: Secondary | ICD-10-CM | POA: Diagnosis not present

## 2023-06-05 DIAGNOSIS — E11622 Type 2 diabetes mellitus with other skin ulcer: Secondary | ICD-10-CM | POA: Diagnosis not present

## 2023-06-11 DIAGNOSIS — L97822 Non-pressure chronic ulcer of other part of left lower leg with fat layer exposed: Secondary | ICD-10-CM | POA: Diagnosis not present

## 2023-06-11 DIAGNOSIS — E11622 Type 2 diabetes mellitus with other skin ulcer: Secondary | ICD-10-CM | POA: Diagnosis not present

## 2023-06-11 DIAGNOSIS — L97819 Non-pressure chronic ulcer of other part of right lower leg with unspecified severity: Secondary | ICD-10-CM | POA: Diagnosis not present

## 2023-06-11 DIAGNOSIS — S0083XD Contusion of other part of head, subsequent encounter: Secondary | ICD-10-CM | POA: Diagnosis not present

## 2023-06-11 DIAGNOSIS — L97912 Non-pressure chronic ulcer of unspecified part of right lower leg with fat layer exposed: Secondary | ICD-10-CM | POA: Diagnosis not present

## 2023-06-11 DIAGNOSIS — R600 Localized edema: Secondary | ICD-10-CM | POA: Diagnosis not present

## 2023-06-11 DIAGNOSIS — L98492 Non-pressure chronic ulcer of skin of other sites with fat layer exposed: Secondary | ICD-10-CM | POA: Diagnosis not present

## 2023-06-19 DIAGNOSIS — R600 Localized edema: Secondary | ICD-10-CM | POA: Diagnosis not present

## 2023-06-19 DIAGNOSIS — L97819 Non-pressure chronic ulcer of other part of right lower leg with unspecified severity: Secondary | ICD-10-CM | POA: Diagnosis not present

## 2023-06-19 DIAGNOSIS — L98492 Non-pressure chronic ulcer of skin of other sites with fat layer exposed: Secondary | ICD-10-CM | POA: Diagnosis not present

## 2023-06-19 DIAGNOSIS — E11622 Type 2 diabetes mellitus with other skin ulcer: Secondary | ICD-10-CM | POA: Diagnosis not present

## 2023-06-19 DIAGNOSIS — S0083XD Contusion of other part of head, subsequent encounter: Secondary | ICD-10-CM | POA: Diagnosis not present

## 2023-06-19 DIAGNOSIS — L97822 Non-pressure chronic ulcer of other part of left lower leg with fat layer exposed: Secondary | ICD-10-CM | POA: Diagnosis not present

## 2023-06-23 ENCOUNTER — Ambulatory Visit: Payer: Self-pay

## 2023-06-23 NOTE — Patient Outreach (Signed)
  Care Coordination   Follow Up Visit Note   06/23/2023 Name: Paul [name] MRN: [id number] DOB: January 09, 1946  Paul [name] is a 77 y.o. year old male who sees Cox, Kirsten, MD for primary care. I spoke with  Paul [name] by phone today.  What matters to the patients health and wellness today?  Follow up call with patient today.  Patient reports recent fall in the drive way. States he got dizzy and fell.  States he is now changing positions slowly. Currently has left side wounds and is going to the wound clinic at [facility].  Patient reports wounds are healing. Wife changes dressing every other day.  He goes to the wound clinic every week.  Reports fasting CBG range of 106-120.  Reports weight range of 149-152.  Today's weight of 152 pounds. Denies any swelling. Reports that he continues to follow a low salt diet. Reports sleeping well with Bipap. States that he has more energy.    Goals Addressed               This Visit's Progress     Improve my breathing (pt-stated)          Interventions Today    Flowsheet Row Most Recent Value  Chronic Disease   Chronic disease during today's visit Diabetes, Chronic Obstructive Pulmonary Disease (COPD), Congestive Heart Failure (CHF), Other  [fall]  General Interventions   General Interventions Discussed/Reviewed General Interventions Reviewed  Exercise Interventions   Exercise Discussed/Reviewed Physical Activity  Physical Activity Discussed/Reviewed Gym  Education Interventions   Education Provided Provided Education  [Reviewed fall prevention and importance of good nutrition and hydration.  Reviewed all pending appointments. Encouraged patient to call me if needed between appointments.]  Provided Verbal Education On Nutrition, When to see the doctor, Blood Sugar Monitoring  Nutrition Interventions   Nutrition Discussed/Reviewed Decreasing salt  Pharmacy Interventions   Pharmacy Dicussed/Reviewed Medications and their functions  Safety  Interventions   Safety Discussed/Reviewed Fall Risk              SDOH assessments and interventions completed:  No     Care Coordination Interventions:  Yes, provided   Follow up plan: Follow up call scheduled for 08/11/2023    Encounter Outcome:  Pt. Visit Completed   Paul Pavy, RN, BSN, CEN [facility] Coordinator [phone]

## 2023-06-24 ENCOUNTER — Telehealth: Payer: Self-pay | Admitting: Family Medicine

## 2023-06-27 DIAGNOSIS — S81002D Unspecified open wound, left knee, subsequent encounter: Secondary | ICD-10-CM | POA: Diagnosis not present

## 2023-06-27 DIAGNOSIS — Z87828 Personal history of other (healed) physical injury and trauma: Secondary | ICD-10-CM | POA: Diagnosis not present

## 2023-06-27 DIAGNOSIS — Z7952 Long term (current) use of systemic steroids: Secondary | ICD-10-CM | POA: Diagnosis not present

## 2023-06-27 DIAGNOSIS — L929 Granulomatous disorder of the skin and subcutaneous tissue, unspecified: Secondary | ICD-10-CM | POA: Diagnosis not present

## 2023-06-27 DIAGNOSIS — J9610 Chronic respiratory failure, unspecified whether with hypoxia or hypercapnia: Secondary | ICD-10-CM | POA: Diagnosis not present

## 2023-06-27 DIAGNOSIS — E119 Type 2 diabetes mellitus without complications: Secondary | ICD-10-CM | POA: Diagnosis not present

## 2023-06-27 DIAGNOSIS — L97822 Non-pressure chronic ulcer of other part of left lower leg with fat layer exposed: Secondary | ICD-10-CM | POA: Diagnosis not present

## 2023-06-27 DIAGNOSIS — Z87891 Personal history of nicotine dependence: Secondary | ICD-10-CM | POA: Diagnosis not present

## 2023-06-27 DIAGNOSIS — L97811 Non-pressure chronic ulcer of other part of right lower leg limited to breakdown of skin: Secondary | ICD-10-CM | POA: Diagnosis not present

## 2023-07-10 DIAGNOSIS — J9610 Chronic respiratory failure, unspecified whether with hypoxia or hypercapnia: Secondary | ICD-10-CM | POA: Diagnosis not present

## 2023-07-10 DIAGNOSIS — E119 Type 2 diabetes mellitus without complications: Secondary | ICD-10-CM | POA: Diagnosis not present

## 2023-07-10 DIAGNOSIS — Z7952 Long term (current) use of systemic steroids: Secondary | ICD-10-CM | POA: Diagnosis not present

## 2023-07-10 DIAGNOSIS — L97822 Non-pressure chronic ulcer of other part of left lower leg with fat layer exposed: Secondary | ICD-10-CM | POA: Diagnosis not present

## 2023-07-10 DIAGNOSIS — L929 Granulomatous disorder of the skin and subcutaneous tissue, unspecified: Secondary | ICD-10-CM | POA: Diagnosis not present

## 2023-07-10 DIAGNOSIS — L97811 Non-pressure chronic ulcer of other part of right lower leg limited to breakdown of skin: Secondary | ICD-10-CM | POA: Diagnosis not present

## 2023-07-11 ENCOUNTER — Other Ambulatory Visit: Payer: Self-pay | Admitting: Family Medicine

## 2023-07-14 ENCOUNTER — Other Ambulatory Visit: Payer: Medicare Other | Admitting: Pharmacist

## 2023-07-14 ENCOUNTER — Other Ambulatory Visit: Payer: Self-pay | Admitting: Family Medicine

## 2023-07-14 MED ORDER — LANCETS MISC. MISC: 3 refills | Status: DC

## 2023-07-14 MED ORDER — BLOOD GLUCOSE TEST VI STRP: ORAL_STRIP | 3 refills | Status: DC

## 2023-07-14 MED ORDER — LANCET DEVICE MISC: 0 refills | Status: DC

## 2023-07-14 MED ORDER — BLOOD GLUCOSE MONITORING SUPPL DEVI: 0 refills | Status: DC

## 2023-07-14 NOTE — Progress Notes (Signed)
Care Coordination Call  Contacted patient to reschedule appointment for this afternoon; he denies any needs at this time regarding medications.   Appears he is enrolled with Music therapist assistance for [place]. Will ensure he is on our reports to re-enroll for assistance for 2025.   He did note that his pharmacy is having an issue filling his test strips. Will resend script without a brand so that pharmacy can use whichever brand is preferred.   Catie Eppie Gibson, PharmD, BCACP, CPP Clinical Pharmacist [facility] [phone]

## 2023-07-21 ENCOUNTER — Ambulatory Visit: Payer: Medicare Other

## 2023-07-21 ENCOUNTER — Ambulatory Visit: Payer: Medicare Other | Attending: Cardiology | Admitting: Cardiology

## 2023-07-21 VITALS — BP 132/72 | HR 73 | Ht 66.0 in | Wt 156.0 lb

## 2023-07-21 DIAGNOSIS — Z95818 Presence of other cardiac implants and grafts: Secondary | ICD-10-CM | POA: Diagnosis present

## 2023-07-21 DIAGNOSIS — I480 Paroxysmal atrial fibrillation: Secondary | ICD-10-CM | POA: Diagnosis not present

## 2023-07-21 DIAGNOSIS — I5032 Chronic diastolic (congestive) heart failure: Secondary | ICD-10-CM | POA: Insufficient documentation

## 2023-07-21 DIAGNOSIS — E1159 Type 2 diabetes mellitus with other circulatory complications: Secondary | ICD-10-CM | POA: Diagnosis not present

## 2023-07-21 DIAGNOSIS — E1169 Type 2 diabetes mellitus with other specified complication: Secondary | ICD-10-CM | POA: Diagnosis not present

## 2023-07-21 DIAGNOSIS — E782 Mixed hyperlipidemia: Secondary | ICD-10-CM

## 2023-07-21 DIAGNOSIS — I152 Hypertension secondary to endocrine disorders: Secondary | ICD-10-CM | POA: Diagnosis not present

## 2023-07-21 NOTE — Patient Instructions (Signed)
Medication Instructions:  Please discontinue yourAmlodipine. Continue all other medications as listed.  *If you need a refill on your cardiac medications before your next appointment, please call your pharmacy*   Follow-Up: At [facility], you and your health needs are our priority.  As part of our continuing mission to provide you with exceptional heart care, we have created designated Provider Care Teams.  These Care Teams include your primary Cardiologist (physician) and Advanced Practice Providers (APPs -  Physician Assistants and Nurse Practitioners) who all work together to provide you with the care you need, when you need it.  We recommend signing up for the patient portal called "MyChart".  Sign up information is provided on this After Visit Summary.  MyChart is used to connect with patients for Virtual Visits (Telemedicine).  Patients are able to view lab/test results, encounter notes, upcoming appointments, etc.  Non-urgent messages can be sent to your provider as well.   To learn more about what you can do with MyChart, go to [email or web address].    Your next appointment:   6  month(s)  Provider:   Donato Schultz, MD

## 2023-07-21 NOTE — Progress Notes (Addendum)
  Cardiology Office Note:  .   Date:  07/21/2023  ID:  Paul [name], DOB 08/26/1946, MRN [id number] PCP: Blane Ohara, MD  [facility] Providers Cardiologist:  Donato Schultz, MD Electrophysiologist:  Lanier Prude, MD     History of Present Illness: .   Paul [name] is a 77 y.o. male here for follow-up atrial fibrillation, Dr. Lalla Brothers, COPD, heart failure hypertension hyperlipidemia.  Has had prior admissions for COPD exacerbation.  Last in April 2024.  Diuresis also took place.  Watchman device in 2022, Dr. Lalla Brothers.  Prior syncope dehydration orthostasis.  Setting of pneumonia.  Still has occasional dizziness when standing up too quickly.  Being careful.  Creatinine 1.06 sodium 132 LDL 65   ROS: Wounds are healing on knees, behind hands after fall.  Studies Reviewed: .       Echocardiogram 01/26/2023-EF 70% grade 1 diastolic dysfunction trivial MR ZIO monitor 2023-heart rate 77 no atrial fibrillation noted Risk Assessment/Calculations:    CHA2DS2-VASc Score = 6  This indicates a 9.7% annual risk of stroke. The patient's score is based upon: CHF History: 1 HTN History: 1 Diabetes History: 1 Stroke History: 0 Vascular Disease History: 1 Age Score: 2 Gender Score: 0            Physical Exam:   VS:  BP 132/72   Pulse 73   Ht 5\' 6"  (1.676 m)   Wt 156 lb (70.8 kg)   SpO2 97%   BMI 25.18 kg/m    Wt Readings from Last 3 Encounters:  07/21/23 156 lb (70.8 kg)  05/29/23 154 lb 5.2 oz (70 kg)  05/14/23 153 lb 9.6 oz (69.7 kg)    GEN: Well nourished, well developed in no acute distress NECK: No JVD; No carotid bruits CARDIAC: RRR, no murmurs, rubs, gallops RESPIRATORY:  Clear to auscultation without rales, wheezing or rhonchi  ABDOMEN: Soft, non-tender, non-distended EXTREMITIES:  No edema; No deformity, bandage wounds noted on knees  ASSESSMENT AND PLAN: .    COPD - Followed closely by pulmonary.  No hospitalizations since April 2024.  Paroxysmal  atrial fibrillation - Watchman device Dr. Lalla Brothers no anticoagulation necessary.  Has had approximately 2 episodes over the past 3 years.  Symptomatic.  Not a good candidate for catheter ablation given his severe lung disease.  Dr. Lalla Brothers discussed Tikosyn.  Continue with conservative approach. - On low-dose carvedilol 3.125, digoxin 0.125 mg.  Diastolic heart failure -Farxiga, hemoglobin A1c was also 6.6.  Orthostatic hypotension --stop amlodipine 2.5.  Gets dizzy when stands up too quickly. - He is also taking furosemide 40 mg a day but quite a bit of fluid was taken off during his last hospitalization.  We will continue with this for now.      Dispo: 6 months  Signed, Donato Schultz, MD   ADDENDUM: went to ER syncope in heat at car wash, fell back, head laceration.   Will stop Coreg 3.125 BID. Continue Digoxin.   Donato Schultz, MD

## 2023-07-22 NOTE — Assessment & Plan Note (Addendum)
Patient's BP is well controlled today.  Continue amlodipine 2.5mg  Continue Coreg 3.125mg  BID

## 2023-07-22 NOTE — Assessment & Plan Note (Signed)
The current medical regimen is effective;  continue present plan and medications. Continue protonix 40 mg twice daily.

## 2023-07-22 NOTE — Progress Notes (Signed)
Subjective:  Patient ID: Paul [name], male    DOB: 1946/03/01  Age: 77 y.o. MRN: [id number]  Chief Complaint  Patient presents with   Medical Management of Chronic Issues    HPI Diabetes:  Complications: none Glucose checking: daily  Glucose logs: 90-140 Hypoglycemia: no  Most recent A1C:6.6 Current medications:  Farxiga 10 mg daily Last Eye Exam: UTD Foot checks: daily   Hyperlipidemia: Current medications: on lipitor 40 mg once daily.    Hypertension/Atrial fibrillation: Current medications: Norvasc 2.5 mg daily, Lasix 40 mg DAILY.   COPD/Chronic respiratory failure with hypoxia: ON breztri 2 puffs twice daily. ALBUTEROL NEBULIZERS TWICE DAILY. On home oxygen 2L.   Mild obstructive sleep apnea with AHI at 13.9 and SpO2 low at 93%.  Required 2 L of oxygen with Nocturnal CPAP at 9 cm H2O.   GERD: on protonix 40 mg twice daily.   GAD: on zoloft 50 mg once daily. Denies depression or anxiety. Has alprazolam once daily as needed. Very sparingly.    Osteoporosis: on prolia every 6 months and vitamin d 2000 U daily   Diet: Fairly healthy.  Last AWV 04/01/23     05/14/2023   11:23 AM 04/01/2023   11:15 AM 03/26/2023    8:54 AM 11/22/2022    9:35 AM 08/16/2022    8:20 AM  Depression screen PHQ 2/9  Decreased Interest 0 0 0 0 0  Down, Depressed, Hopeless 0 0 0 0 0  PHQ - 2 Score 0 0 0 0 0        07/23/2023    9:58 AM  Fall Risk   Falls in the past year? 1  Number falls in past yr: 0  Injury with Fall? 0  Risk for fall due to : No Fall Risks    Patient Care Team: Blane Ohara, MD as PCP - General (Family Medicine) Jake Bathe, MD as PCP - Cardiology (Cardiology) Lanier Prude, MD as PCP - Electrophysiology (Cardiology) Storm Frisk, MD as Attending Physician (Pulmonary Disease) Nyoka Cowden, MD as Consulting Physician (Pulmonary Disease) [facility] as Registered Nurse [facility] and Palliative Medicine) Rockne Menghini, RN as [facility] Care Management   Review of Systems  Constitutional:  Negative for chills, diaphoresis, fatigue and fever.  HENT:  Negative for congestion, ear pain and sore throat.   Respiratory:  Negative for cough and shortness of breath.   Cardiovascular:  Negative for chest pain and leg swelling.  Gastrointestinal:  Negative for abdominal pain, constipation, diarrhea, nausea and vomiting.  Genitourinary:  Negative for dysuria and urgency.  Musculoskeletal:  Negative for arthralgias and myalgias.  Neurological:  Negative for dizziness and headaches.  Psychiatric/Behavioral:  Negative for dysphoric mood.     Current Outpatient Medications on File Prior to Visit  Medication Sig Dispense Refill   Accu-Chek Softclix Lancets lancets USE UP TO 4 TIMES DAILY AS DIRECTED (INS ONLY COVERS ONCE DAILY) 100 each 3   acetaminophen (TYLENOL) 500 MG tablet Take 1,000 mg by mouth at bedtime as needed (pain).     albuterol (PROAIR HFA) 108 (90 Base) MCG/ACT inhaler Inhale 2 puffs into the lungs as needed for wheezing or shortness of breath.     albuterol (PROVENTIL) (2.5 MG/3ML) 0.083% nebulizer solution Take 3 mLs (2.5 mg total) by nebulization every 6 (six) hours as needed for shortness of breath or wheezing. 360 mL 5   ALPRAZolam (XANAX) 0.5 MG tablet Take 1 tablet (0.5 mg  total) by mouth 2 (two) times daily as needed for anxiety. 30 tablet 0   Ascorbic Acid (VITAMIN C) 1000 MG tablet Take 1,000 mg by mouth daily.     aspirin EC 81 MG tablet Take 1 tablet (81 mg total) by mouth daily. Swallow whole. 90 tablet 3   atorvastatin (LIPITOR) 40 MG tablet TAKE 1 TABLET BY MOUTH EVERYDAY AT BEDTIME 90 tablet 1   blood glucose meter kit and supplies KIT Dispense based on patient and insurance preference. Use up to four times daily as directed. 1 each 0   Blood Glucose Monitoring Suppl DEVI Use to test blood sugar up to twice daily. May substitute to any manufacturer covered by patient's insurance. E11.0 1  each 0   Budeson-Glycopyrrol-Formoterol (BREZTRI AEROSPHERE) 160-9-4.8 MCG/ACT AERO Inhale 2 puffs into the lungs 2 (two) times daily. 3 each 3   calcium carbonate (OS-CAL) 600 MG TABS tablet Take 600 mg by mouth daily.     carvedilol (COREG) 3.125 MG tablet Take 1 tablet (3.125 mg total) by mouth 2 (two) times daily with a meal. 180 tablet 3   Cholecalciferol (VITAMIN D3) 50 MCG (2000 UT) TABS Take 2,000 Units by mouth in the morning.     dapagliflozin propanediol (FARXIGA) 10 MG TABS tablet Take 10 mg by mouth daily.     denosumab (PROLIA) 60 MG/ML SOSY injection Inject 60 mg into the skin every 6 (six) months. 180 mL 0   digoxin (LANOXIN) 0.125 MG tablet Take 1 tablet (0.125 mg total) by mouth daily. 90 tablet 3   ferrous sulfate 325 (65 FE) MG tablet Take 325 mg by mouth 2 (two) times daily with a meal.     fluticasone (FLONASE) 50 MCG/ACT nasal spray Place 2 sprays into both nostrils daily as needed for allergies or rhinitis.     furosemide (LASIX) 40 MG tablet Take 1 tablet by mouth daily, may take 1 extra tablet daily only as needed for weight gain or lower extremity swelling     Glucose Blood (BLOOD GLUCOSE TEST STRIPS) STRP Use to test blood sugar up to twice daily. May substitute to any manufacturer covered by patient's insurance. E11.0 100 strip 3   Lancet Device MISC Use to test blood sugar up to twice daily. May substitute to any manufacturer covered by patient's insurance. E11.0 1 each 0   Lancets Misc. MISC Use to test blood sugar up to twice daily. May substitute to any manufacturer covered by patient's insurance. E11.0 100 each 3   loratadine (CLARITIN) 10 MG tablet Take 10 mg by mouth in the morning.     Melatonin 12 MG TABS Take 12 mg by mouth at bedtime.     Multiple Vitamin (MULTIVITAMIN WITH MINERALS) TABS tablet Take 1 tablet by mouth daily with breakfast.     OXYGEN Inhale 2 L/min into the lungs at bedtime.     pantoprazole (PROTONIX) 40 MG tablet Take 1 tablet (40 mg total)  by mouth 2 (two) times daily. 180 tablet 3   predniSONE (DELTASONE) 10 MG tablet TAKE 1 TABLET (10 MG TOTAL) BY MOUTH DAILY WITH BREAKFAST. 90 tablet 0   Probiotic Product (PROBIOTIC DAILY) CAPS Take 1 capsule by mouth in the morning.     sertraline (ZOLOFT) 50 MG tablet TAKE 1 TABLET BY MOUTH EVERYDAY AT BEDTIME 90 tablet 1   No current facility-administered medications on file prior to visit.   Past Medical History:  Diagnosis Date   Anemia    Aortic atherosclerosis (HCC)  Aspiration pneumonia due to inhalation of vomitus (HCC) 02/03/2022   Atrial fibrillation (HCC)    Back pain    COPD (chronic obstructive pulmonary disease) (HCC)    COPD exacerbation (HCC) 12/27/2021   COVID-19 12/24/2020   Diabetes mellitus without complication (HCC)    Dizziness 04/08/2022   Elevated PSA    GAD (generalized anxiety disorder)    GERD (gastroesophageal reflux disease)    High cholesterol    Hypertension    Lingular pneumonia 01/05/2021   See cxr 01/06/20 rx with zpak/ cefipime x 5 days and then developed purulent sputum again 01/12/21 so rx levcaquin 500 mg daily x 7 days then return for cxr before more    Lung nodule < 6cm on CT 05/29/2016   Osteoporosis    Oxygen deficiency    Pneumonia    January 2022   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 Lalla Brothers with a 20 mm Watchman FLX device   RSV bronchitis 01/27/2022   Vertebral compression fracture (HCC) 05/29/2019   T8 compression fracture noted on CT scan 05/28/2019 inpatient with chronic steroid dependent COPD - rx calcitonin nasal spray rx per PCP    Past Surgical History:  Procedure Laterality Date   CATARACT EXTRACTION Right    ELBOW SURGERY  07/2022   surgery on olecranon bursa. Dr. Yehuda Budd   IR KYPHO EA ADDL LEVEL THORACIC OR LUMBAR  11/28/2021   LEFT ATRIAL APPENDAGE OCCLUSION N/A 08/02/2021   Procedure: LEFT ATRIAL APPENDAGE OCCLUSION;  Surgeon: Tonny Bollman, MD;  Location: [facility]  INVASIVE CV LAB;  Service: Cardiovascular;  Laterality: N/A;   SKIN SURGERY     shoulders and chest   TEE WITHOUT CARDIOVERSION N/A 08/02/2021   Procedure: TRANSESOPHAGEAL ECHOCARDIOGRAM (TEE);  Surgeon: Tonny Bollman, MD;  Location: [facility] INVASIVE CV LAB;  Service: Cardiovascular;  Laterality: N/A;   TEE WITHOUT CARDIOVERSION N/A 09/13/2021   Procedure: TRANSESOPHAGEAL ECHOCARDIOGRAM (TEE);  Surgeon: Thurmon Fair, MD;  Location: [facility] ENDOSCOPY;  Service: Cardiovascular;  Laterality: N/A;    Family History  Problem Relation Age of Onset   Heart disease Brother    Heart disease Mother    Heart disease Father    Cancer Paternal Uncle    Social History   Socioeconomic History   Marital status: Married    Spouse name: Not on file   Number of children: 3   Years of education: Not on file   Highest education level: 10th grade  Occupational History   Occupation: retired    Comment: truck Hospital doctor  Tobacco Use   Smoking status: Former    Current packs/day: 0.00    Average packs/day: 2.0 packs/day for 52.0 years (104.0 ttl pk-yrs)    Types: Cigarettes    Start date: 02/03/1957    Quit date: 02/03/2009    Years since quitting: 14.4   Smokeless tobacco: Never   Tobacco comments:    Counseled to remain smoke free  Vaping Use   Vaping status: Never Used  Substance and Sexual Activity   Alcohol use: Not Currently    Alcohol/week: 0.0 standard drinks of alcohol    Comment: occ   Drug use: No   Sexual activity: Not on file  Other Topics Concern   Not on file  Social History Narrative   Originally from [place]. Previously worked driving a Surveyor, minerals. No pets currently. No mold exposure. 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       wears sunscreen, brushes and flosses daily, see's dentist bi-annually, has smoke/carbon monoxide detectors, wears a seatbelt and practices gun safety      Social Determinants of Health    Financial Resource Strain: Low Risk  (07/19/2023)   Overall Financial Resource Strain (CARDIA)    Difficulty of Paying Living Expenses: Not hard at all  Food Insecurity: No Food Insecurity (07/19/2023)   Hunger Vital Sign    Worried About Running Out of Food in the Last Year: Never true    Ran Out of Food in the Last Year: Never true  Transportation Needs: No Transportation Needs (07/19/2023)   PRAPARE - Administrator, Civil Service (Medical): No    Lack of Transportation (Non-Medical): No  Physical Activity: Insufficiently Active (07/19/2023)   Exercise Vital Sign    Days of Exercise per Week: 3 days    Minutes of Exercise per Session: 30 min  Stress: No Stress Concern Present (07/19/2023)   [employer] of Occupational Health - Occupational Stress Questionnaire    Feeling of Stress : Not at all  Social Connections: Socially Integrated (07/19/2023)   Social Connection and Isolation Panel [NHANES]    Frequency of Communication with Friends and Family: More than three times a week    Frequency of Social Gatherings with Friends and Family: More than three times a week    Attends Religious Services: More than 4 times per year    Active Member of [employer] or Organizations: Yes    Attends Engineer, structural: More than 4 times per year    Marital Status: Married    Objective:  BP 132/60   Pulse 72   Temp (!) 96.9 F (36.1 C)   Ht 5\' 6"  (1.676 m)   Wt 157 lb (71.2 kg)   SpO2 99%   BMI 25.34 kg/m      07/23/2023    9:41 AM 07/21/2023   11:21 AM 05/29/2023    7:52 PM  BP/Weight  Systolic BP 132 132 155  Diastolic BP 60 72 66  Wt. (Lbs) 157 156   BMI 25.34 kg/m2 25.18 kg/m2     Physical Exam Vitals reviewed.  Constitutional:      Appearance: Normal appearance. He is normal weight.  Cardiovascular:     Rate and Rhythm: Normal rate and regular rhythm.     Heart sounds: No murmur heard. Pulmonary:     Effort: Pulmonary effort is normal.     Breath  sounds: Normal breath sounds.  Abdominal:     General: Abdomen is flat. Bowel sounds are normal.     Palpations: Abdomen is soft.     Tenderness: There is no abdominal tenderness.  Neurological:     Mental Status: He is alert and oriented to person, place, and time.  Psychiatric:        Mood and Affect: Mood normal.        Behavior: Behavior normal.     Diabetic Foot Exam - Simple   Simple Foot Form Diabetic Foot exam was performed with the following findings: Yes 07/23/2023  9:58 AM  Visual Inspection No deformities, no ulcerations, no other skin breakdown bilaterally: Yes Sensation Testing Intact to touch and monofilament testing bilaterally: Yes Pulse Check Posterior Tibialis and Dorsalis pulse intact bilaterally: Yes Comments      Lab Results  Component Value Date   WBC 12.7 (H) 07/21/2023   HGB 12.1 (L) 07/21/2023   HCT  39.6 07/21/2023   PLT 324 07/21/2023   GLUCOSE 93 07/21/2023   CHOL 196 07/21/2023   TRIG 238 (H) 07/21/2023   HDL 56 07/21/2023   LDLCALC 100 (H) 07/21/2023   ALT 34 07/21/2023   AST 23 07/21/2023   NA 142 07/21/2023   K 4.7 07/21/2023   CL 97 07/21/2023   CREATININE 0.78 07/21/2023   BUN 20 07/21/2023   CO2 29 07/21/2023   TSH 3.540 07/21/2023   INR 0.9 01/25/2023   HGBA1C 6.6 (H) 07/21/2023   MICROALBUR Negative 10/09/2021      Assessment & Plan:    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HCC) Assessment & Plan: Control: good Recommend check sugars fasting daily. Recommend check feet daily. Recommend annual eye exams. Medicines: Farxiga 10 mg daily. Continue to work on eating a healthy diet.  Labs reviewed  Orders: -     Hemoglobin A1c; Future -     Microalbumin / creatinine urine ratio -     CBC with Differential/Platelet; Future -     Comprehensive metabolic panel; Future -     Lipid panel; Future  Mixed hyperlipidemia Assessment & Plan: Well controlled.  No changes to medicines. Continue lipitor 40 mg before  bed.  Continue to work on eating a healthy diet. Labs reviewed.   Hypertension associated with diabetes (HCC) Assessment & Plan: Patient's BP is well controlled today.  Continue amlodipine 2.5mg  Continue Coreg 3.125mg  BID   Gastroesophageal reflux disease, unspecified whether esophagitis present Assessment & Plan: The current medical regimen is effective;  continue present plan and medications. Continue protonix 40 mg twice daily.    GAD (generalized anxiety disorder) Assessment & Plan: Continue zoloft 50 mg once daily and xanax once daily as needed..    OSA (obstructive sleep apnea) Assessment & Plan: Continue CPAP and oxygen 2 L.  Patient is compliant and benefits.   Chronic respiratory failure with hypoxia (HCC) Assessment & Plan: Continue oxygen at 2 L.   COPD GOLD  III  Assessment & Plan: Continue Breztri 2 puffs twice daily and albuterol.  Continue prednisone 10 mg daily. Managed by pulmonology   Age-related osteoporosis without current pathological fracture -     DG Bone Density; Future     No orders of the defined types were placed in this encounter.   Orders Placed This Encounter  Procedures   DG Bone Density   Hemoglobin A1c   Microalbumin / creatinine urine ratio   CBC with Differential/Platelet   Comprehensive metabolic panel   Lipid panel     Follow-up: Return in about 3 months (around 10/23/2023) for chronic, fasting.   I,Katherina A Bramblett,acting as a scribe for Blane Ohara, MD.,have documented all relevant documentation on the behalf of Blane Ohara, MD,as directed by  Blane Ohara, MD while in the presence of Blane Ohara, MD.   An After Visit Summary was printed and given to the patient.  Blane Ohara, MD  Family Practice [phone]

## 2023-07-22 NOTE — Assessment & Plan Note (Addendum)
Continue zoloft 50 mg once daily and xanax once daily as needed.Marland Kitchen

## 2023-07-22 NOTE — Assessment & Plan Note (Addendum)
Control: good Recommend check sugars fasting daily. Recommend check feet daily. Recommend annual eye exams. Medicines: Farxiga 10 mg daily. Continue to work on eating a healthy diet.  Labs reviewed

## 2023-07-23 ENCOUNTER — Encounter: Payer: Self-pay | Admitting: Family Medicine

## 2023-07-23 ENCOUNTER — Ambulatory Visit (INDEPENDENT_AMBULATORY_CARE_PROVIDER_SITE_OTHER): Payer: Medicare Other | Admitting: Family Medicine

## 2023-07-23 VITALS — BP 132/60 | HR 72 | Temp 96.9°F | Ht 66.0 in | Wt 157.0 lb

## 2023-07-23 DIAGNOSIS — E782 Mixed hyperlipidemia: Secondary | ICD-10-CM | POA: Diagnosis not present

## 2023-07-23 DIAGNOSIS — E1159 Type 2 diabetes mellitus with other circulatory complications: Secondary | ICD-10-CM

## 2023-07-23 DIAGNOSIS — K219 Gastro-esophageal reflux disease without esophagitis: Secondary | ICD-10-CM | POA: Diagnosis not present

## 2023-07-23 DIAGNOSIS — M810 Age-related osteoporosis without current pathological fracture: Secondary | ICD-10-CM | POA: Diagnosis not present

## 2023-07-23 DIAGNOSIS — G4733 Obstructive sleep apnea (adult) (pediatric): Secondary | ICD-10-CM | POA: Diagnosis not present

## 2023-07-23 DIAGNOSIS — J449 Chronic obstructive pulmonary disease, unspecified: Secondary | ICD-10-CM | POA: Diagnosis not present

## 2023-07-23 DIAGNOSIS — I152 Hypertension secondary to endocrine disorders: Secondary | ICD-10-CM

## 2023-07-23 DIAGNOSIS — J9611 Chronic respiratory failure with hypoxia: Secondary | ICD-10-CM

## 2023-07-23 DIAGNOSIS — E1169 Type 2 diabetes mellitus with other specified complication: Secondary | ICD-10-CM

## 2023-07-23 DIAGNOSIS — F411 Generalized anxiety disorder: Secondary | ICD-10-CM | POA: Diagnosis not present

## 2023-07-23 NOTE — Patient Instructions (Signed)
Take 2 grams of fish oil daily.

## 2023-07-26 DIAGNOSIS — M810 Age-related osteoporosis without current pathological fracture: Secondary | ICD-10-CM | POA: Insufficient documentation

## 2023-07-26 NOTE — Assessment & Plan Note (Addendum)
Well controlled.  No changes to medicines. Continue lipitor 40 mg before bed.  Continue to work on eating a healthy diet. Labs reviewed.

## 2023-07-26 NOTE — Assessment & Plan Note (Addendum)
Continue Breztri 2 puffs twice daily and albuterol.  Continue prednisone 10 mg daily. Managed by pulmonology

## 2023-07-26 NOTE — Assessment & Plan Note (Signed)
Continue oxygen at 2 L.  

## 2023-07-26 NOTE — Assessment & Plan Note (Signed)
Continue CPAP and oxygen 2 L.  Patient is compliant and benefits.

## 2023-07-28 ENCOUNTER — Other Ambulatory Visit: Payer: Self-pay | Admitting: Family Medicine

## 2023-07-28 DIAGNOSIS — J209 Acute bronchitis, unspecified: Secondary | ICD-10-CM

## 2023-07-31 ENCOUNTER — Telehealth: Payer: Self-pay | Admitting: Internal Medicine

## 2023-08-04 MED ORDER — PREDNISONE 10 MG PO TABS: 10.0000 mg | ORAL_TABLET | Freq: Every day | ORAL | 0 refills | Status: DC

## 2023-08-04 NOTE — Progress Notes (Signed)
error 

## 2023-08-04 NOTE — Telephone Encounter (Signed)
Done

## 2023-08-05 ENCOUNTER — Other Ambulatory Visit: Payer: Self-pay | Admitting: Family Medicine

## 2023-08-05 ENCOUNTER — Other Ambulatory Visit: Payer: Self-pay

## 2023-08-05 MED ORDER — ACCU-CHEK SOFTCLIX LANCETS MISC: 3 refills | Status: DC

## 2023-08-07 ENCOUNTER — Ambulatory Visit (INDEPENDENT_AMBULATORY_CARE_PROVIDER_SITE_OTHER): Payer: Medicare Other | Admitting: Adult Health

## 2023-08-07 ENCOUNTER — Encounter: Payer: Self-pay | Admitting: Adult Health

## 2023-08-07 ENCOUNTER — Telehealth: Payer: Self-pay

## 2023-08-07 VITALS — BP 130/70 | HR 75 | Temp 97.8°F | Ht 66.0 in | Wt 157.0 lb

## 2023-08-07 DIAGNOSIS — I5032 Chronic diastolic (congestive) heart failure: Secondary | ICD-10-CM

## 2023-08-07 DIAGNOSIS — J449 Chronic obstructive pulmonary disease, unspecified: Secondary | ICD-10-CM | POA: Diagnosis not present

## 2023-08-07 DIAGNOSIS — G4733 Obstructive sleep apnea (adult) (pediatric): Secondary | ICD-10-CM

## 2023-08-07 DIAGNOSIS — J9611 Chronic respiratory failure with hypoxia: Secondary | ICD-10-CM

## 2023-08-07 MED ORDER — PREDNISONE 10 MG PO TABS: 10.0000 mg | ORAL_TABLET | Freq: Every day | ORAL | 1 refills | Status: DC

## 2023-08-07 NOTE — Assessment & Plan Note (Signed)
Continue on oxygen to maintain O2 saturations greater than 88 to 90%. 

## 2023-08-07 NOTE — Assessment & Plan Note (Signed)
Appears euvolemic on exam.  Continue on current maintenance regimen.  Continue follow-up with cardiology

## 2023-08-07 NOTE — Assessment & Plan Note (Signed)
Appears stable , no changes  Vaccine up to date  Get Flu shot this fall   Plan  Patient Instructions  Continue on BREZTRI 2 puffs Twice daily, rinse after use.  Continue on Prednisone 10mg  daily Mucinex Twice daily  As needed  Cough/congestion  Flutter valve Three times a day  .  Albuterol Neb or inhaler as needed  Wear Oxygen 2l/m  with activity and At bedtime   to maintain O2 sats >88-90%.  Activity as tolerated.  Aspirations precautions as discussed.  Daily weights Continue on Lasix as directed.  Continue on CPAP At bedtime with Oxygen 2l/m  and with naps.  Please contact office for sooner follow up if symptoms do not improve or worsen or seek emergency care  Follow up with Dr. Sherene Sires  Or  NP in 3 months and As needed

## 2023-08-07 NOTE — Patient Instructions (Addendum)
Continue on BREZTRI 2 puffs Twice daily, rinse after use.  Continue on Prednisone 10mg  daily Mucinex Twice daily  As needed  Cough/congestion  Flutter valve Three times a day  .  Albuterol Neb or inhaler as needed  Wear Oxygen 2l/m  with activity and At bedtime   to maintain O2 sats >88-90%.  Activity as tolerated.  Aspirations precautions as discussed.  Daily weights Continue on Lasix as directed.  Continue on CPAP At bedtime with Oxygen 2l/m  and with naps.  Please contact office for sooner follow up if symptoms do not improve or worsen or seek emergency care  Follow up with Dr. Sherene Sires  Or  NP in 3 months and As needed

## 2023-08-07 NOTE — Progress Notes (Signed)
@Patient  ID: Paul [name], male    DOB: [birth year], 77 y.o.   MRN: [id number]  Chief Complaint  Patient presents with   Follow-up    Referring provider: Blane Ohara, MD  HPI: 77 year old male former smoker quit in 2010 followed for severe COPD with emphysema, steroid-dependent.  Chronic respiratory failure on oxygen and OSA on CPAP with O2 .  Prone to recurrent COPD exacerbations and hospitalizations Medical history significant for diastolic heart failureatrial fibrillation not on anticoagulation therapy due to history of severe bleeding secondary to hematuria, balance issues and frequent falls. Intolerant to Daliresp COVID-19 infection January 2022 required hospitalization Hospitalization February 2023 for rhinovirus with COPD exacerbation Hospitalized March 2023 and July 2023 for COPD exacerbation March 2023 outpatient ABG without hypercarbia unable to qualify for nocturnal BiPAP or NIV   TEST/EVENTS :  05/29/16: FVC 2.08 L (86%) FEV1 0.83 L (31%) FEV1/FVC 0.40 FEF 25-75 0.30 L (14%) no bronchodilator response TLC 6.56 L (107%) RV 167% ERV 160% DLCO corrected 60% (hemoglobin 10.9) 07/15/13: FVC 2.68 L (60%) FEV1 1.63 L (53%) FEV1/FVC 0.61 FEF 25-75 0.76 L (26%) 02/04/12: FVC 2.18 L (57%) FEV1 1.09 L (36%) FEV1/FVC 0.50 FEF 25-75 0.33 L (11%)     CT sinus February 2021 no acute sinus disease noted CT chest February 2021 emphysematous changes, several old thoracic compression fractures   2D echo July 2020 EF 60-65%, 2D echo September 13, 2021 EF 60-65%, Watchman device,, grade 3 moderate layered plaque involving the descending aorta   Palliative care started 2021 (nurse comes every 2 weeks)  NPSG March 11, 2023 showed Mild obstructive sleep apnea with AHI at 13.9 and SpO2 low at 93%.  Required 2 L of oxygen with CPAP at 9 cm H2O.   CT chest May 29, 2023 showed severe emphysema, stable by apical scarring, stable pulmonary nodules largest measuring 3 mm.  Old rib fractures and  compression fracture  08/07/2023 Follow up: COPD, O2 RF , OSA  Patient returns for 22-month follow-up.  Patient is followed for severe COPD and chronic respiratory failure.  Patient is prone to recurrent hospitalizations.  Since last visit patient says he has been doing well with his breathing.  Has not been hospitalized for his COPD since March 2024.  Patient did have a fall in June and went to the emergency room with extensive skin tears and bruising.  Patient says he is recovering.  Says this happened because he lost his balance when he stood up too fast.  Got some dizziness and fell.  Patient remains on Breztri inhaler twice daily.  Denies any increased use of albuterol.  Says that he has been going to the gym 3 days a week.  Has been able to use the treadmill with his oxygen and has been feeling very well.  He does have underlying sleep apnea and is on nocturnal CPAP with oxygen.  Patient feels that since starting this he is actually breathing better.  Feels that he is getting a better night sleep and has more energy.  CPAP download shows excellent compliance with 100% usage.  Daily average usage at 9.5 hours.  Patient is on CPAP 9 cm H2O.  AHI 5.3/hour.  He remains on oxygen 2 L with activity.  Patient says his O2 saturations have actually been doing very well. Patient has not history of diastolic heart failure and A-fib.  Follows with cardiology.  Says leg swelling has been doing much better.  Remains on Lasix 40 mg daily with an  option to take 1 extra daily if needed       Allergies  Allergen Reactions   Tape Other (See Comments)    PLEASE USE COBAN WRAP IN LIEU OF "TAPE," as it "pulls off the skin!!   Daliresp [Roflumilast] Diarrhea and Nausea And Vomiting   Levaquin [Levofloxacin] Palpitations and Other (See Comments)    Made the B/P fluctuate and heart raced   Alendronate Anxiety and Other (See Comments)    Jittery/nervous    Immunization History  Administered Date(s) Administered    COVID-19, mRNA, vaccine(Comirnaty)12 years and older 10/31/2022   Fluad Quad(high Dose 65+) 10/02/2020, 10/09/2021, 09/05/2022, 10/17/2022   Influenza Split 09/22/2013, 09/01/2015   Influenza Whole 09/04/2011, 09/21/2012   Influenza, High Dose Seasonal PF 09/02/2016, 09/10/2017, 08/31/2018, 09/24/2018, 10/21/2019   Influenza-Unspecified 08/23/2014   Moderna Covid-19 Vaccine Bivalent Booster 29yrs & up 10/09/2021   Moderna SARS-COV2 Booster Vaccination 07/05/2021   Moderna Sars-Covid-2 Vaccination 01/17/2020, 02/14/2020, 10/06/2020   PNEUMOCOCCAL CONJUGATE-20 12/05/2022   Pneumococcal Conjugate-13 01/11/2014, 12/09/2016   Pneumococcal Polysaccharide-23 08/23/2010, 08/29/2011   Respiratory Syncytial Virus Vaccine,Recomb Aduvanted(Arexvy) 09/23/2022   Tdap 02/01/2019   Zoster Recombinant(Shingrix) 04/04/2014    Past Medical History:  Diagnosis Date   Anemia    Aortic atherosclerosis (HCC)    Aspiration pneumonia due to inhalation of vomitus (HCC) 02/03/2022   Atrial fibrillation (HCC)    Back pain    COPD (chronic obstructive pulmonary disease) (HCC)    COPD exacerbation (HCC) 12/27/2021   COVID-19 12/24/2020   Diabetes mellitus without complication (HCC)    Dizziness 04/08/2022   Elevated PSA    GAD (generalized anxiety disorder)    GERD (gastroesophageal reflux disease)    High cholesterol    Hypertension    Lingular pneumonia 01/05/2021   See cxr 01/06/20 rx with zpak/ cefipime x 5 days and then developed purulent sputum again 01/12/21 so rx levcaquin 500 mg daily x 7 days then return for cxr before more    Lung nodule < 6cm on CT 05/29/2016   Osteoporosis    Oxygen deficiency    Pneumonia    January 2022   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 Lalla Brothers with a 20 mm Watchman FLX device   RSV bronchitis 01/27/2022   Vertebral compression fracture (HCC) 05/29/2019   T8 compression fracture noted on CT scan 05/28/2019 inpatient  with chronic steroid dependent COPD - rx calcitonin nasal spray rx per PCP     Tobacco History: Social History   Tobacco Use  Smoking Status Former   Current packs/day: 0.00   Average packs/day: 2.0 packs/day for 52.0 years (104.0 ttl pk-yrs)   Types: Cigarettes   Start date: 02/03/1957   Quit date: 02/03/2009   Years since quitting: 14.5  Smokeless Tobacco Never  Tobacco Comments   Counseled to remain smoke free   Counseling given: Not Answered Tobacco comments: Counseled to remain smoke free   Outpatient Medications Prior to Visit  Medication Sig Dispense Refill   Accu-Chek Softclix Lancets lancets Check fasting sugar once daily. 100 each 3   acetaminophen (TYLENOL) 500 MG tablet Take 1,000 mg by mouth at bedtime as needed (pain).     albuterol (PROAIR HFA) 108 (90 Base) MCG/ACT inhaler Inhale 2 puffs into the lungs as needed for wheezing or shortness of breath.     albuterol (PROVENTIL) (2.5 MG/3ML) 0.083% nebulizer solution Take 3 mLs (2.5 mg total) by nebulization every 6 (six) hours as needed  for shortness of breath or wheezing. 360 mL 5   ALPRAZolam (XANAX) 0.5 MG tablet Take 1 tablet (0.5 mg total) by mouth 2 (two) times daily as needed for anxiety. 30 tablet 0   Ascorbic Acid (VITAMIN C) 1000 MG tablet Take 1,000 mg by mouth daily.     aspirin EC 81 MG tablet Take 1 tablet (81 mg total) by mouth daily. Swallow whole. 90 tablet 3   atorvastatin (LIPITOR) 40 MG tablet TAKE 1 TABLET BY MOUTH EVERYDAY AT BEDTIME 90 tablet 1   blood glucose meter kit and supplies KIT Dispense based on patient and insurance preference. Use up to four times daily as directed. 1 each 0   Blood Glucose Monitoring Suppl DEVI Use to test blood sugar up to twice daily. May substitute to any manufacturer covered by patient's insurance. E11.0 1 each 0   Budeson-Glycopyrrol-Formoterol (BREZTRI AEROSPHERE) 160-9-4.8 MCG/ACT AERO Inhale 2 puffs into the lungs 2 (two) times daily. 3 each 3   calcium carbonate  (OS-CAL) 600 MG TABS tablet Take 600 mg by mouth daily.     carvedilol (COREG) 3.125 MG tablet Take 1 tablet (3.125 mg total) by mouth 2 (two) times daily with a meal. 180 tablet 3   Cholecalciferol (VITAMIN D3) 50 MCG (2000 UT) TABS Take 2,000 Units by mouth in the morning.     dapagliflozin propanediol (FARXIGA) 10 MG TABS tablet Take 10 mg by mouth daily.     denosumab (PROLIA) 60 MG/ML SOSY injection Inject 60 mg into the skin every 6 (six) months. 180 mL 0   digoxin (LANOXIN) 0.125 MG tablet Take 1 tablet (0.125 mg total) by mouth daily. 90 tablet 3   ferrous sulfate 325 (65 FE) MG tablet Take 325 mg by mouth 2 (two) times daily with a meal.     fluticasone (FLONASE) 50 MCG/ACT nasal spray Place 2 sprays into both nostrils daily as needed for allergies or rhinitis.     furosemide (LASIX) 40 MG tablet Take 1 tablet by mouth daily, may take 1 extra tablet daily only as needed for weight gain or lower extremity swelling     Glucose Blood (BLOOD GLUCOSE TEST STRIPS) STRP Use to test blood sugar up to twice daily. May substitute to any manufacturer covered by patient's insurance. E11.0 100 strip 3   Lancet Device MISC Use to test blood sugar up to twice daily. May substitute to any manufacturer covered by patient's insurance. E11.0 1 each 0   Lancets Misc. MISC Use to test blood sugar up to twice daily. May substitute to any manufacturer covered by patient's insurance. E11.0 100 each 3   loratadine (CLARITIN) 10 MG tablet Take 10 mg by mouth in the morning.     Melatonin 12 MG TABS Take 12 mg by mouth at bedtime.     Multiple Vitamin (MULTIVITAMIN WITH MINERALS) TABS tablet Take 1 tablet by mouth daily with breakfast.     OXYGEN Inhale 2 L/min into the lungs at bedtime.     pantoprazole (PROTONIX) 40 MG tablet Take 1 tablet (40 mg total) by mouth 2 (two) times daily. 180 tablet 3   Probiotic Product (PROBIOTIC DAILY) CAPS Take 1 capsule by mouth in the morning.     sertraline (ZOLOFT) 50 MG tablet  TAKE 1 TABLET BY MOUTH EVERYDAY AT BEDTIME 90 tablet 1   predniSONE (DELTASONE) 10 MG tablet Take 1 tablet (10 mg total) by mouth daily with breakfast. 90 tablet 0   No facility-administered medications prior to  visit.    Review of Systems:   Constitutional:   No  weight loss, night sweats,  Fevers, chills, fatigue, or  lassitude.  HEENT:   No headaches,  Difficulty swallowing,  Tooth/dental problems, or  Sore throat,                No sneezing, itching, ear ache, nasal congestion, post nasal drip,   CV:  No chest pain,  Orthopnea, PND, swelling in lower extremities, anasarca, dizziness, palpitations, syncope.   GI  No heartburn, indigestion, abdominal pain, nausea, vomiting, diarrhea, change in bowel habits, loss of appetite, bloody stools.   Resp:   No excess mucus, no productive cough,  No non-productive cough,  No coughing up of blood.  No change in color of mucus.  No wheezing.  No chest wall deformity  Skin: no rash or lesions.  GU: no dysuria, change in color of urine, no urgency or frequency.  No flank pain, no hematuria   MS:  No joint pain or swelling.  No decreased range of motion.  No back pain.    Physical Exam  BP 130/70 (BP Location: Left Arm, Patient Position: Sitting, Cuff Size: Normal)   Pulse 75   Temp 97.8 F (36.6 C) (Oral)   Ht 5\' 6"  (1.676 m)   Wt 157 lb (71.2 kg)   SpO2 91% Comment: 96% after rest  BMI 25.34 kg/m   GEN: A/Ox3; pleasant , NAD, well nourished    HEENT:  [place]/AT,   NOSE-clear, THROAT-clear, no lesions, no postnasal drip or exudate noted.   NECK:  Supple w/ fair ROM; no JVD; normal carotid impulses w/o bruits; no thyromegaly or nodules palpated; no lymphadenopathy.    RESP  Clear  P & A; w/o, wheezes/ rales/ or rhonchi. no accessory muscle use, no dullness to percussion  CARD:  RRR, no m/r/g, no peripheral edema, pulses intact, no cyanosis or clubbing.  GI:   Soft & nt; nml bowel sounds; no organomegaly or masses detected.   Musco:  Warm bil, no deformities or joint swelling noted.   Neuro: alert, no focal deficits noted.    Skin: Warm, no lesions or rashes    Lab Results:  CBC  ProBNP No results found for: "PROBNP"  Imaging: No results found.  Administration History     None          Latest Ref Rng & Units 05/29/2016    9:15 AM  PFT Results  FVC-Pre L 1.92   FVC-Predicted Pre % 52   Pre FEV1/FVC % % 41   FEV1-Pre L 0.78   FEV1-Predicted Pre % 29     No results found for: "NITRICOXIDE"      Assessment & Plan:   COPD GOLD  III  Appears stable , no changes  Vaccine up to date  Get Flu shot this fall   Plan  Patient Instructions  Continue on BREZTRI 2 puffs Twice daily, rinse after use.  Continue on Prednisone 10mg  daily Mucinex Twice daily  As needed  Cough/congestion  Flutter valve Three times a day  .  Albuterol Neb or inhaler as needed  Wear Oxygen 2l/m  with activity and At bedtime   to maintain O2 sats >88-90%.  Activity as tolerated.  Aspirations precautions as discussed.  Daily weights Continue on Lasix as directed.  Continue on CPAP At bedtime with Oxygen 2l/m  and with naps.  Please contact office for sooner follow up if symptoms do not improve or worsen or  seek emergency care  Follow up with Dr. Sherene Sires  Or  NP in 3 months and As needed     OSA (obstructive sleep apnea) Excellent control compliance on nocturnal CPAP no changes  Plan Patient Instructions  Continue on BREZTRI 2 puffs Twice daily, rinse after use.  Continue on Prednisone 10mg  daily Mucinex Twice daily  As needed  Cough/congestion  Flutter valve Three times a day  .  Albuterol Neb or inhaler as needed  Wear Oxygen 2l/m  with activity and At bedtime   to maintain O2 sats >88-90%.  Activity as tolerated.  Aspirations precautions as discussed.  Daily weights Continue on Lasix as directed.  Continue on CPAP At bedtime with Oxygen 2l/m  and with naps.  Please contact office for sooner follow up  if symptoms do not improve or worsen or seek emergency care  Follow up with Dr. Sherene Sires  Or  NP in 3 months and As needed     Chronic respiratory failure with hypoxia (HCC) Continue on oxygen to maintain O2 saturations greater than 88 to 90%  Chronic diastolic CHF (congestive heart failure) (HCC) Appears euvolemic on exam.  Continue on current maintenance regimen.  Continue follow-up with cardiology     Rubye Oaks, NP 08/07/2023

## 2023-08-07 NOTE — Assessment & Plan Note (Signed)
Excellent control compliance on nocturnal CPAP no changes  Plan Patient Instructions  Continue on BREZTRI 2 puffs Twice daily, rinse after use.  Continue on Prednisone 10mg  daily Mucinex Twice daily  As needed  Cough/congestion  Flutter valve Three times a day  .  Albuterol Neb or inhaler as needed  Wear Oxygen 2l/m  with activity and At bedtime   to maintain O2 sats >88-90%.  Activity as tolerated.  Aspirations precautions as discussed.  Daily weights Continue on Lasix as directed.  Continue on CPAP At bedtime with Oxygen 2l/m  and with naps.  Please contact office for sooner follow up if symptoms do not improve or worsen or seek emergency care  Follow up with Dr. Sherene Sires  Or  NP in 3 months and As needed

## 2023-08-07 NOTE — Telephone Encounter (Signed)
   Patient: Paul [name]  DOB: 1946/10/21  MRN: [id number]  Prolia benefits verified by [employer] on: 08/06/23 Coverage Available: Buy & Bill Prior authorization NOT REQUIRED Deductible: $240 of $240 met   Benefit Details: For the Primary MD Purchase access option, benefits are subject to a $240 deductible ($240.00 met) and 20% co-insurance for the administration and cost of Prolia.   For the Secondary MD Purchase access option, this plan is a Medicare Supplement Plan F and it covers the Medicare Part B deductible, co-insurance and 100% of the excess charges.  Patient agreed to proceed with ordering and will be scheduled once it has arrived in the office.  Patient is due for his next Prolia injection 09/14/23.  Jacklynn Bue, LPN

## 2023-08-08 ENCOUNTER — Ambulatory Visit: Payer: Medicare Other | Admitting: Adult Health

## 2023-08-11 ENCOUNTER — Ambulatory Visit: Payer: Self-pay

## 2023-08-11 NOTE — Patient Outreach (Signed)
  Care Coordination   Follow Up Visit Note   08/11/2023 Name: Paul [name] MRN: [id number] DOB: [birth year]  Paul [name] is a 76 y.o. year old male who sees Cox, Kirsten, MD for primary care. I spoke with  Paul [name] by phone today.  What matters to the patients health and wellness today?  Patient reports that he is doing well. Reports breathing is doing great. Taking his medications as prescribed, using his CPAP and exercising. Reports weight is unchanged.  No recent admissions.   Reports skin tears are all healed up at this time.     Goals Addressed               This Visit's Progress     Improve my breathing (pt-stated)          Interventions Today    Flowsheet Row Most Recent Value  Chronic Disease   Chronic disease during today's visit Chronic Obstructive Pulmonary Disease (COPD), Congestive Heart Failure (CHF)  General Interventions   General Interventions Discussed/Reviewed General Interventions Discussed, Labs, Doctor Visits  Doctor Visits Discussed/Reviewed Doctor Visits Reviewed, Specialist  PCP/Specialist Visits Compliance with follow-up visit  Exercise Interventions   Exercise Discussed/Reviewed Physical Activity  Physical Activity Discussed/Reviewed Physical Activity Reviewed  Education Interventions   Education Provided Provided Education  [Reviewed importance of being aware of changes in condition with upcoming season change of the fall when leaves are falling. Encouraged paitent to call me if needed and get prompt treatment for signs of worsening condition.]  Provided Verbal Education On Nutrition, Medication, Exercise, When to see the doctor, Sick Day Rules  Nutrition Interventions   Nutrition Discussed/Reviewed Nutrition Reviewed  Pharmacy Interventions   Pharmacy Dicussed/Reviewed Medications and their functions  Safety Interventions   Safety Discussed/Reviewed Fall Risk              SDOH assessments and interventions completed:  No      Care Coordination Interventions:  Yes, provided   Follow up plan: Follow up call scheduled for 09/22/2023    Encounter Outcome:  Pt. Visit Completed   Rowe Pavy, RN, BSN, CEN [facility] Coordinator [phone]

## 2023-08-14 ENCOUNTER — Telehealth: Payer: Self-pay | Admitting: Cardiology

## 2023-08-14 ENCOUNTER — Telehealth: Payer: Self-pay | Admitting: Family Medicine

## 2023-08-14 ENCOUNTER — Other Ambulatory Visit: Payer: Self-pay

## 2023-08-14 DIAGNOSIS — M810 Age-related osteoporosis without current pathological fracture: Secondary | ICD-10-CM

## 2023-08-14 MED ORDER — CARVEDILOL 3.125 MG PO TABS: 3.1250 mg | ORAL_TABLET | Freq: Two times a day (BID) | ORAL | 3 refills | Status: DC

## 2023-08-14 NOTE — Telephone Encounter (Signed)
Pt's medication was sent to pt's pharmacy as requested. Confirmation received.  °

## 2023-08-14 NOTE — Telephone Encounter (Signed)
   Paul [name] has been scheduled for the following appointment:  WHAT: DEXA WHERE:  OUTPATIENT DATE: 08/21/2023 TIME: 8:30 AM CHECK-IN  Patient has been made aware.

## 2023-08-14 NOTE — Telephone Encounter (Signed)
*  STAT* If patient is at the pharmacy, call can be transferred to refill team.   1. Which medications need to be refilled? (please list name of each medication and dose if known)   carvedilol (COREG) 3.125 MG tablet    2. Which pharmacy/location (including street and city if local pharmacy) is medication to be sent to?[id number] - [place], [place] - [address]   3. Do they need a 30 day or 90 day supply? 90 day

## 2023-08-20 ENCOUNTER — Other Ambulatory Visit: Payer: Self-pay

## 2023-08-20 ENCOUNTER — Encounter (HOSPITAL_BASED_OUTPATIENT_CLINIC_OR_DEPARTMENT_OTHER): Payer: Self-pay

## 2023-08-20 ENCOUNTER — Emergency Department (HOSPITAL_BASED_OUTPATIENT_CLINIC_OR_DEPARTMENT_OTHER): Payer: Medicare Other

## 2023-08-20 ENCOUNTER — Emergency Department (HOSPITAL_BASED_OUTPATIENT_CLINIC_OR_DEPARTMENT_OTHER)
Admission: EM | Admit: 2023-08-20 | Discharge: 2023-08-20 | Disposition: A | Discharge status: Home / Self Care | Payer: Medicare Other | Attending: Emergency Medicine | Admitting: Emergency Medicine

## 2023-08-20 DIAGNOSIS — S81012A Laceration without foreign body, left knee, initial encounter: Secondary | ICD-10-CM | POA: Insufficient documentation

## 2023-08-20 DIAGNOSIS — S8992XA Unspecified injury of left lower leg, initial encounter: Secondary | ICD-10-CM | POA: Diagnosis present

## 2023-08-20 DIAGNOSIS — S0003XA Contusion of scalp, initial encounter: Secondary | ICD-10-CM | POA: Diagnosis not present

## 2023-08-20 DIAGNOSIS — Z7951 Long term (current) use of inhaled steroids: Secondary | ICD-10-CM | POA: Diagnosis not present

## 2023-08-20 DIAGNOSIS — W01198A Fall on same level from slipping, tripping and stumbling with subsequent striking against other object, initial encounter: Secondary | ICD-10-CM | POA: Insufficient documentation

## 2023-08-20 DIAGNOSIS — R55 Syncope and collapse: Secondary | ICD-10-CM | POA: Diagnosis not present

## 2023-08-20 DIAGNOSIS — I700 Atherosclerosis of aorta: Secondary | ICD-10-CM | POA: Diagnosis not present

## 2023-08-20 DIAGNOSIS — E119 Type 2 diabetes mellitus without complications: Secondary | ICD-10-CM | POA: Diagnosis not present

## 2023-08-20 DIAGNOSIS — Z7982 Long term (current) use of aspirin: Secondary | ICD-10-CM | POA: Diagnosis not present

## 2023-08-20 DIAGNOSIS — J449 Chronic obstructive pulmonary disease, unspecified: Secondary | ICD-10-CM | POA: Insufficient documentation

## 2023-08-20 DIAGNOSIS — S0083XA Contusion of other part of head, initial encounter: Secondary | ICD-10-CM | POA: Insufficient documentation

## 2023-08-20 DIAGNOSIS — T148XXA Other injury of unspecified body region, initial encounter: Secondary | ICD-10-CM

## 2023-08-20 DIAGNOSIS — R0602 Shortness of breath: Secondary | ICD-10-CM | POA: Diagnosis not present

## 2023-08-20 DIAGNOSIS — S199XXA Unspecified injury of neck, initial encounter: Secondary | ICD-10-CM | POA: Diagnosis not present

## 2023-08-20 DIAGNOSIS — Y92810 Car as the place of occurrence of the external cause: Secondary | ICD-10-CM | POA: Insufficient documentation

## 2023-08-20 DIAGNOSIS — S80212A Abrasion, left knee, initial encounter: Secondary | ICD-10-CM | POA: Diagnosis not present

## 2023-08-20 DIAGNOSIS — T671XXA Heat syncope, initial encounter: Secondary | ICD-10-CM

## 2023-08-20 DIAGNOSIS — S0990XA Unspecified injury of head, initial encounter: Secondary | ICD-10-CM | POA: Diagnosis not present

## 2023-08-20 DIAGNOSIS — S0091XA Abrasion of unspecified part of head, initial encounter: Secondary | ICD-10-CM

## 2023-08-20 LAB — CBC
HCT: 37 % — ABNORMAL LOW (ref 39.0–52.0)
Hemoglobin: 11.4 g/dL — ABNORMAL LOW (ref 13.0–17.0)
MCH: 24.1 pg — ABNORMAL LOW (ref 26.0–34.0)
MCHC: 30.8 g/dL (ref 30.0–36.0)
MCV: 78.1 fL — ABNORMAL LOW (ref 80.0–100.0)
Platelets: 310 10*3/uL (ref 150–400)
RBC: 4.74 MIL/uL (ref 4.22–5.81)
RDW: 15.4 % (ref 11.5–15.5)
WBC: 12.6 10*3/uL — ABNORMAL HIGH (ref 4.0–10.5)
nRBC: 0 % (ref 0.0–0.2)

## 2023-08-20 LAB — URINALYSIS, ROUTINE W REFLEX MICROSCOPIC
Bilirubin Urine: NEGATIVE
Glucose, UA: >=500 mg/dL — AB
Hgb urine dipstick: NEGATIVE
Ketones, ur: NEGATIVE mg/dL
Leukocytes,Ua: NEGATIVE
Nitrite: NEGATIVE
Protein, ur: NEGATIVE mg/dL
Specific Gravity, Urine: <=1.005 (ref 1.005–1.030)
pH: 5.5 (ref 5.0–8.0)

## 2023-08-20 LAB — BASIC METABOLIC PANEL
Anion gap: 13 (ref 5–15)
BUN: 20 mg/dL (ref 8–23)
CO2: 28 mmol/L (ref 22–32)
Calcium: 8.5 mg/dL — ABNORMAL LOW (ref 8.9–10.3)
Chloride: 90 mmol/L — ABNORMAL LOW (ref 98–111)
Creatinine, Ser: 1.12 mg/dL (ref 0.61–1.24)
GFR, Estimated: >60 mL/min (ref >60)
Glucose, Bld: 129 mg/dL — ABNORMAL HIGH (ref 70–99)
Potassium: 4.3 mmol/L (ref 3.5–5.1)
Sodium: 131 mmol/L — ABNORMAL LOW (ref 135–145)

## 2023-08-20 LAB — TROPONIN I (HIGH SENSITIVITY): Troponin I (High Sensitivity): 6 ng/L (ref <18)

## 2023-08-20 LAB — URINALYSIS, MICROSCOPIC (REFLEX)

## 2023-08-20 MED ORDER — LIDOCAINE-EPINEPHRINE (PF) 2 %-1:200000 IJ SOLN
10.0000 mL | Freq: Once | INTRAMUSCULAR | Status: AC
  Administered 2023-08-20: 10 mL
  Filled 2023-08-20: qty 20

## 2023-08-20 NOTE — ED Notes (Signed)
Discharge paperwork reviewed entirely with patient, including follow up care. Pain was under control. No prescriptions were called in, but all questions were addressed.  Pt verbalized understanding as well as all parties involved. No questions or concerns voiced at the time of discharge. No acute distress noted.   Pt ambulated out to PVA without incident or assistance.  

## 2023-08-20 NOTE — Discharge Instructions (Addendum)
You were seen in the emergency department for syncopal episode with multiple skin tears .   We have closed your laceration(s) with sterile strips and sutures. These sutures need to be removed in 10 days. This can be done at any doctor's office, urgent care, or emergency department. However, it would be optimal if you could follow up with your wound care specialist.   If any of the sutures come out before it is time for removal, that is okay. Make sure to keep the area as clean and dry as possible. You can let warm soapy warm run over the area, but do NOT scrub it.   Watch out for signs of infection, like we discussed, including: increased redness, tenderness, or drainage of pus from the area. If this happens and you have not been prescribed an antibiotic, please seek medical attention for possible infection.   You can take over the counter pain medicine like ibuprofen or tylenol as needed.   Please also follow up with your cardiologist regarding your syncopal episodes.   Continue to monitor how you're doing and return to the ER for new or worsening symptoms.

## 2023-08-20 NOTE — ED Triage Notes (Signed)
Pt states that the [place] and got too hot and became dizzy. States that he fell and hit his head on the concrete. Left knee injury and head noted to have laceration. States that he did loose consciousness.  Pt is not diabetic.

## 2023-08-20 NOTE — ED Provider Notes (Signed)
[facility] [place] Provider Note   CSN: [id number] Arrival date & time: 08/20/23  1346     History  No chief complaint on file.   Paul [name] is a 77 y.o. 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  Patient states that he was at a car wash and when he got out of his car he was very overheated.  He got lightheaded and then woke up on the ground.  Believes that he struck the back of his head and his back when he fell.  Denies any chest pain.  He usually has oxygen at home for sleeping and as needed, and he put himself on it after the episode as he was feeling somewhat short of breath when he woke up. Has wounds to the back of his head and his left knee.   HPI     Home Medications Prior to Admission medications   Medication Sig Start Date End Date Taking? Authorizing Provider  Accu-Chek Softclix Lancets lancets Check fasting sugar once daily. 08/05/23   CoxFritzi Mandes, MD  acetaminophen (TYLENOL) 500 MG tablet Take 1,000 mg by mouth at bedtime as needed (pain).    [provider]  albuterol (PROAIR HFA) 108 (90 Base) MCG/ACT inhaler Inhale 2 puffs into the lungs as needed for wheezing or shortness of breath.    [provider]  albuterol (PROVENTIL) (2.5 MG/3ML) 0.083% nebulizer solution Take 3 mLs (2.5 mg total) by nebulization every 6 (six) hours as needed for shortness of breath or wheezing. 10/23/22   Parrett, Virgel Bouquet, NP  ALPRAZolam (XANAX) 0.5 MG tablet Take 1 tablet (0.5 mg total) by mouth 2 (two) times daily as needed for anxiety. 07/25/22   CoxFritzi Mandes, MD  Ascorbic Acid (VITAMIN C) 1000 MG tablet Take 1,000 mg by mouth daily.    [provider]  aspirin EC 81 MG tablet Take 1 tablet (81 mg total) by mouth daily. Swallow whole. 01/28/22   Filbert Schilder, NP   atorvastatin (LIPITOR) 40 MG tablet TAKE 1 TABLET BY MOUTH EVERYDAY AT BEDTIME 02/24/23   CoxFritzi Mandes, MD  blood glucose meter kit and supplies KIT Dispense based on patient and insurance preference. Use up to four times daily as directed. 05/06/23   CoxFritzi Mandes, MD  Blood Glucose Monitoring Suppl DEVI Use to test blood sugar up to twice daily. May substitute to any manufacturer covered by patient's insurance. E11.0 07/14/23   CoxFritzi Mandes, MD  Budeson-Glycopyrrol-Formoterol (BREZTRI AEROSPHERE) 160-9-4.8 MCG/ACT AERO Inhale 2 puffs into the lungs 2 (two) times daily. 01/02/23   Cox, Fritzi Mandes, MD  calcium carbonate (OS-CAL) 600 MG TABS tablet Take 600 mg by mouth daily.    [provider]  carvedilol (COREG) 3.125 MG tablet Take 1 tablet (3.125 mg total) by mouth 2 (two) times daily with a meal. 08/14/23   Jake Bathe, MD  Cholecalciferol (VITAMIN D3) 50 MCG (2000 UT) TABS Take 2,000 Units by mouth in the morning.    [provider]  dapagliflozin propanediol (FARXIGA) 10 MG TABS tablet Take 10 mg by mouth daily.    [provider]  denosumab (PROLIA) 60 MG/ML SOSY injection Inject 60 mg into the skin every 6 (six) months. 03/11/23   CoxFritzi Mandes, MD  digoxin (LANOXIN) 0.125 MG tablet Take 1 tablet (0.125 mg total) by mouth daily. 05/13/23  Jake Bathe, MD  ferrous sulfate 325 (65 FE) MG tablet Take 325 mg by mouth 2 (two) times daily with a meal.    [provider]  fluticasone (FLONASE) 50 MCG/ACT nasal spray Place 2 sprays into both nostrils daily as needed for allergies or rhinitis.    [provider]  furosemide (LASIX) 40 MG tablet Take 1 tablet by mouth daily, may take 1 extra tablet daily only as needed for weight gain or lower extremity swelling    [provider]  Glucose Blood (BLOOD GLUCOSE TEST STRIPS) STRP Use to test blood sugar up to twice daily. May substitute to any manufacturer covered by patient's insurance. E11.0 07/14/23   Blane Ohara, MD  Lancet Device MISC Use to test blood sugar up to twice daily. May substitute to any manufacturer covered by patient's insurance. E11.0 07/14/23   Blane Ohara, MD  Lancets Misc. MISC Use to test blood sugar up to twice daily. May substitute to any manufacturer covered by patient's insurance. E11.0 07/14/23   CoxFritzi Mandes, MD  loratadine (CLARITIN) 10 MG tablet Take 10 mg by mouth in the morning.    [provider]  Melatonin 12 MG TABS Take 12 mg by mouth at bedtime.    [provider]  Multiple Vitamin (MULTIVITAMIN WITH MINERALS) TABS tablet Take 1 tablet by mouth daily with breakfast.    [provider]  OXYGEN Inhale 2 L/min into the lungs at bedtime.    [provider]  pantoprazole (PROTONIX) 40 MG tablet Take 1 tablet (40 mg total) by mouth 2 (two) times daily. 11/06/22   Nyoka Cowden, MD  predniSONE (DELTASONE) 10 MG tablet Take 1 tablet (10 mg total) by mouth daily with breakfast. 08/07/23   Parrett, Virgel Bouquet, NP  Probiotic Product (PROBIOTIC DAILY) CAPS Take 1 capsule by mouth in the morning.    [provider]  sertraline (ZOLOFT) 50 MG tablet TAKE 1 TABLET BY MOUTH EVERYDAY AT BEDTIME 03/17/23   Cox, Kirsten, MD      Allergies    Tape, Daliresp [roflumilast], Levaquin [levofloxacin], and Alendronate    Review of Systems   Review of Systems  Respiratory:  Positive for shortness of breath.   Skin:  Positive for wound.  Neurological:  Positive for syncope.  All other systems reviewed and are negative.   Physical Exam Updated Vital Signs BP 136/64   Pulse 68   Temp 98.1 F (36.7 C)   Resp 18   Ht 5\' 6"  (1.676 m)   Wt 69.4 kg   SpO2 100%   BMI 24.69 kg/m  Physical Exam Vitals and nursing note reviewed.  Constitutional:      Appearance: Normal appearance.  HENT:     Head: Normocephalic.      Comments: Superficial abrasion and hematoma to the crown of the head Eyes:     Conjunctiva/sclera: Conjunctivae normal.   Cardiovascular:     Rate and Rhythm: Normal rate and regular rhythm.     Comments: Orthostatic Lying BP- Lying: 168/84 Pulse- Lying: 67  Orthostatic Sitting BP- Sitting: 143/65 Pulse- Sitting: 66  Orthostatic Standing at 0 minutes BP- Standing at 0 minutes: 151/60 Pulse- Standing at 0 minutes: 69   Pulmonary:     Effort: Pulmonary effort is normal. No respiratory distress.     Breath sounds: Normal breath sounds.     Comments: On chronic home O2, 2L [employer] Abdominal:     General: There is no distension.  Palpations: Abdomen is soft.     Tenderness: There is no abdominal tenderness.  Musculoskeletal:     Right lower leg: No edema.     Left lower leg: No edema.     Comments: Full passive ROM of all regions of spine.  No midline spinal tenderness, step-offs or crepitus. However some tenderness just left of mid thoracic spine. Strength 5/5 in all extremities.  Sensation intact in all extremities.  No bony tenderness or deformities of the L knee, normal ROM  Skin:    General: Skin is warm and dry.     Comments: Deep skin tear/laceration about 4 cm in length to the left medial knee. More superficial skin tears (2) to the left lateral knee  Neurological:     General: No focal deficit present.     Mental Status: He is alert.     ED Results / Procedures / Treatments   Labs (all labs ordered are listed, but only abnormal results are displayed) Labs Reviewed  BASIC METABOLIC PANEL - Abnormal; Notable for the following components:      Result Value   Sodium 131 (*)    Chloride 90 (*)    Glucose, Bld 129 (*)    Calcium 8.5 (*)    All other components within normal limits  CBC - Abnormal; Notable for the following components:   WBC 12.6 (*)    Hemoglobin 11.4 (*)    HCT 37.0 (*)    MCV 78.1 (*)    MCH 24.1 (*)    All other components within normal limits  URINALYSIS, ROUTINE W REFLEX MICROSCOPIC - Abnormal; Notable for the following components:   Glucose, UA >=500 (*)    All  other components within normal limits  URINALYSIS, MICROSCOPIC (REFLEX) - Abnormal; Notable for the following components:   Bacteria, UA RARE (*)    All other components within normal limits  CBG MONITORING, ED  TROPONIN I (HIGH SENSITIVITY)  TROPONIN I (HIGH SENSITIVITY)    EKG EKG Interpretation Date/Time:  Wednesday August 20 2023 14:00:14 EDT Ventricular Rate:  72 PR Interval:  130 QRS Duration:  78 QT Interval:  356 QTC Calculation: 389 R Axis:   67  Text Interpretation: Normal sinus rhythm Normal ECG When compared with ECG of 29-May-2023 13:52, PREVIOUS ECG IS PRESENT Confirmed by Vonita Moss [phone]) on 08/20/2023 2:03:18 PM  Radiology CT Thoracic Spine Wo Contrast  Result Date: 08/20/2023 CLINICAL DATA:  [place]. EXAM: CT THORACIC SPINE WITHOUT CONTRAST; CT LUMBAR SPINE WITHOUT CONTRAST TECHNIQUE: Multidetector CT images of the thoracic and lumbar spine were obtained using the standard protocol without intravenous contrast. RADIATION DOSE REDUCTION: This exam was performed according to the departmental dose-optimization program which includes automated exposure control, adjustment of the mA and/or kV according to patient size and/or use of iterative reconstruction technique. COMPARISON:  CTA Chest 02/04/23 FINDINGS: CT THORACIC SPINE FINDINGS Alignment: Normal. Vertebrae: Redemonstrated multilevel compression deformities in the thoracic spine of the T4-T10, T12-L1 levels. There are kyphoplasty changes of the L1 vertebral body level. No new compression deformity is visualized. Paraspinal and other soft tissues: Aortic atherosclerotic calcifications. Moderate centrilobular emphysema. Disc levels: No evidence of high-grade spinal canal stenosis CT LUMBAR SPINE FINDINGS Segmentation: 5 lumbar type vertebrae. Alignment: Normal. Vertebrae: No acute fracture or focal pathologic process. Paraspinal and other soft tissues: Negative. Disc levels: No evidence of high-grade spinal canal stenosis.  IMPRESSION: 1. No acute fracture or traumatic subluxation of the thoracic or lumbar spine. 2. Redemonstrated multilevel compression deformities  in the thoracic spine, as above. Aortic Atherosclerosis (ICD10-I70.0) and Emphysema (ICD10-J43.9). Electronically Signed   By: Lorenza Cambridge M.D.   On: 08/20/2023 15:31   CT Lumbar Spine Wo Contrast  Result Date: 08/20/2023 CLINICAL DATA:  [place]. EXAM: CT THORACIC SPINE WITHOUT CONTRAST; CT LUMBAR SPINE WITHOUT CONTRAST TECHNIQUE: Multidetector CT images of the thoracic and lumbar spine were obtained using the standard protocol without intravenous contrast. RADIATION DOSE REDUCTION: This exam was performed according to the departmental dose-optimization program which includes automated exposure control, adjustment of the mA and/or kV according to patient size and/or use of iterative reconstruction technique. COMPARISON:  CTA Chest 02/04/23 FINDINGS: CT THORACIC SPINE FINDINGS Alignment: Normal. Vertebrae: Redemonstrated multilevel compression deformities in the thoracic spine of the T4-T10, T12-L1 levels. There are kyphoplasty changes of the L1 vertebral body level. No new compression deformity is visualized. Paraspinal and other soft tissues: Aortic atherosclerotic calcifications. Moderate centrilobular emphysema. Disc levels: No evidence of high-grade spinal canal stenosis CT LUMBAR SPINE FINDINGS Segmentation: 5 lumbar type vertebrae. Alignment: Normal. Vertebrae: No acute fracture or focal pathologic process. Paraspinal and other soft tissues: Negative. Disc levels: No evidence of high-grade spinal canal stenosis. IMPRESSION: 1. No acute fracture or traumatic subluxation of the thoracic or lumbar spine. 2. Redemonstrated multilevel compression deformities in the thoracic spine, as above. Aortic Atherosclerosis (ICD10-I70.0) and Emphysema (ICD10-J43.9). Electronically Signed   By: Lorenza Cambridge M.D.   On: 08/20/2023 15:31   CT Head Wo Contrast  Result Date:  08/20/2023 CLINICAL DATA:  Head trauma, minor (Age >= 65y) Syncope/presyncope, cerebrovascular cause suspected; Neck trauma (Age >= 65y). EXAM: CT HEAD WITHOUT CONTRAST CT CERVICAL SPINE WITHOUT CONTRAST TECHNIQUE: Multidetector CT imaging of the head and cervical spine was performed following the standard protocol without intravenous contrast. Multiplanar CT image reconstructions of the cervical spine were also generated. RADIATION DOSE REDUCTION: This exam was performed according to the departmental dose-optimization program which includes automated exposure control, adjustment of the mA and/or kV according to patient size and/or use of iterative reconstruction technique. COMPARISON:  CT head and cervical spine 05/29/2023. FINDINGS: CT HEAD FINDINGS Brain: No acute intracranial hemorrhage. Gray-white differentiation is preserved. No hydrocephalus or extra-axial collection. No mass effect or midline shift. Vascular: No hyperdense vessel or unexpected calcification. Skull: No calvarial fracture or suspicious bone lesion. Skull base is unremarkable. Sinuses/Orbits: No acute finding. Other: Left parietal scalp contusion. CT CERVICAL SPINE FINDINGS Alignment: Normal. Skull base and vertebrae: No acute fracture. Normal craniocervical junction. No suspicious bone lesions. Soft tissues and spinal canal: No prevertebral fluid or swelling. No visible canal hematoma. Disc levels: Mild cervical spondylosis without high-grade spinal canal stenosis. Upper chest: No acute findings. Other: Atherosclerotic calcifications of the carotid bulbs. IMPRESSION: 1. No acute intracranial abnormality. Left parietal scalp contusion. 2. No acute cervical spine fracture or traumatic listhesis. Electronically Signed   By: Orvan Falconer M.D.   On: 08/20/2023 15:25   CT Cervical Spine Wo Contrast  Result Date: 08/20/2023 CLINICAL DATA:  Head trauma, minor (Age >= 65y) Syncope/presyncope, cerebrovascular cause suspected; Neck trauma (Age >=  65y). EXAM: CT HEAD WITHOUT CONTRAST CT CERVICAL SPINE WITHOUT CONTRAST TECHNIQUE: Multidetector CT imaging of the head and cervical spine was performed following the standard protocol without intravenous contrast. Multiplanar CT image reconstructions of the cervical spine were also generated. RADIATION DOSE REDUCTION: This exam was performed according to the departmental dose-optimization program which includes automated exposure control, adjustment of the mA and/or kV according to patient size and/or use of iterative  reconstruction technique. COMPARISON:  CT head and cervical spine 05/29/2023. FINDINGS: CT HEAD FINDINGS Brain: No acute intracranial hemorrhage. Gray-white differentiation is preserved. No hydrocephalus or extra-axial collection. No mass effect or midline shift. Vascular: No hyperdense vessel or unexpected calcification. Skull: No calvarial fracture or suspicious bone lesion. Skull base is unremarkable. Sinuses/Orbits: No acute finding. Other: Left parietal scalp contusion. CT CERVICAL SPINE FINDINGS Alignment: Normal. Skull base and vertebrae: No acute fracture. Normal craniocervical junction. No suspicious bone lesions. Soft tissues and spinal canal: No prevertebral fluid or swelling. No visible canal hematoma. Disc levels: Mild cervical spondylosis without high-grade spinal canal stenosis. Upper chest: No acute findings. Other: Atherosclerotic calcifications of the carotid bulbs. IMPRESSION: 1. No acute intracranial abnormality. Left parietal scalp contusion. 2. No acute cervical spine fracture or traumatic listhesis. Electronically Signed   By: Orvan Falconer M.D.   On: 08/20/2023 15:25    Procedures .Marland KitchenLaceration Repair  Date/Time: 08/20/2023 5:23 PM  Performed by: Su Monks, PA-C Authorized by: Su Monks, PA-C   Consent:    Consent obtained:  Verbal   Consent given by:  Patient   Risks, benefits, and alternatives were discussed: yes     Risks discussed:  Infection,  pain, poor cosmetic result and poor wound healing Universal protocol:    Patient identity confirmed:  Provided demographic data Anesthesia:    Anesthesia method:  Local infiltration   Local anesthetic:  Lidocaine 2% WITH epi Laceration details:    Location:  Leg   Leg location:  L knee   Length (cm):  4   Depth (mm):  1 Exploration:    Hemostasis achieved with:  Epinephrine and direct pressure Treatment:    Area cleansed with:  Saline   Amount of cleaning:  Standard   Irrigation solution:  Sterile saline   Irrigation method:  Syringe   Visualized foreign bodies/material removed: no     Debridement:  None   Undermining:  None   Scar revision: no   Skin repair:    Repair method:  Sutures   Suture size:  4-0   Suture material:  Prolene   Suture technique:  Horizontal mattress and simple interrupted   Number of sutures:  4 Approximation:    Approximation:  Loose Repair type:    Repair type:  Simple Post-procedure details:    Dressing:  Non-adherent dressing   Procedure completion:  Tolerated well, no immediate complications .Marland KitchenLaceration Repair  Date/Time: 08/20/2023 5:25 PM  Performed by: Su Monks, PA-C Authorized by: Su Monks, PA-C   Consent:    Consent obtained:  Verbal   Consent given by:  Patient   Risks discussed:  Infection, poor cosmetic result, pain and poor wound healing Universal protocol:    Patient identity confirmed:  Provided demographic data Anesthesia:    Anesthesia method:  None Laceration details:    Location:  Leg   Leg location:  L knee   Length (cm):  5   Depth (mm):  0.5 Exploration:    Hemostasis achieved with:  Direct pressure Treatment:    Area cleansed with:  Saline   Amount of cleaning:  Standard   Irrigation solution:  Sterile saline   Irrigation method:  Syringe   Visualized foreign bodies/material removed: no     Debridement:  None   Undermining:  None   Scar revision: no   Skin repair:    Repair method:   Steri-Strips   Number of Steri-Strips:  7 Approximation:    Approximation:  Loose Repair type:    Repair type:  Simple Post-procedure details:    Dressing:  Non-adherent dressing   Procedure completion:  Tolerated well, no immediate complications     Medications Ordered in ED Medications  lidocaine-EPINEPHrine (XYLOCAINE W/EPI) 2 %-1:200000 (PF) injection 10 mL (10 mLs Infiltration Given by Other 08/20/23 1551)    ED Course/ Medical Decision Making/ A&P                                 Medical Decision Making Amount and/or Complexity of Data Reviewed Labs: ordered. Radiology: ordered.  Risk Prescription drug management.   This patient is a 77 y.o. male  who presents to the ED for concern of syncope.   Differential diagnoses prior to evaluation: The emergent differential diagnosis includes, but is not limited to,  CVA, ACS, arrhythmia, vasovagal syncope, orthostatic hypotension, sepsis, hypoglycemia, electrolyte disturbance, respiratory failure, symptomatic anemia, dehydration, heat injury, polypharmacy. This is not an exhaustive differential.   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 The pertinent findings include: Normal vital signs, no acute distress.  Heart regular rate and rhythm, lung sounds clear.  No peripheral edema.  Lab Tests/Imaging studies: I personally interpreted labs/imaging and the pertinent results include: CBC with mildly cytosis, stable hemoglobin.  BMP grossly unremarkable, mildly lower sodium and chloride.  Urinalysis unremarkable.  Negative troponin.  CTs of head, cervical, thoracic, and lumbar spine all without acute traumatic findings. I agree with the radiologist interpretation.  Cardiac monitoring: EKG obtained and interpreted by myself and attending physician  which shows: NSR  Procedure: Laceration was anesthestized with lidocaine/epinephrine and closed with sutures and steristrips. Patient tolerated procedure well with no immediate complications. Tdap was up to date.   Disposition: After consideration of the diagnostic results and the patients response to treatment, I feel that emergency department workup does not suggest an emergent condition requiring admission or immediate intervention beyond what has been performed at this time. The plan is: Discharge to home after evaluation for syncope, likely heat related.  Had no concerning symptoms leading up to the episode, negative troponin, no acute ischemic changes on EKG.  Patient is feeling much better.  No worsening of condition during about 4-hour period of observation in the ER.  No significant orthostatic changes or reproducible symptoms.  Wounds dressed appropriately.  Patient plans to follow-up and have a wound check and suture removal with his wound care doctor.  Also strongly encouraged him to follow-up with his cardiologist, who he will give a call to tomorrow.  States that he only just saw them about 1 to 2 weeks ago.. The patient is safe for discharge and has been instructed to return immediately for worsening symptoms, change in symptoms or any other concerns.  I discussed this case with my attending physician Dr. Eloise Harman who cosigned this note including patient's presenting symptoms, physical exam, and planned diagnostics and interventions. Attending physician stated agreement with plan or made changes to plan which were implemented.   Final Clinical Impression(s) / ED Diagnoses Final diagnoses:  Heat syncope, initial encounter  Multiple skin tears  Abrasion of head, initial encounter    Rx / DC Orders ED Discharge Orders     None      Portions of this report may have been transcribed using voice recognition  software. Every effort was made to ensure accuracy; however, inadvertent  computerized transcription errors may be present.    Jeanella Flattery 08/20/23 1729    Rondel Baton, MD 08/22/23 [phone]

## 2023-08-20 NOTE — ED Notes (Signed)
Pt pink warm and dry, NAD. Wife accompanying him to the waiting room, will drive him home with the [facility] on.

## 2023-08-21 ENCOUNTER — Telehealth: Payer: Self-pay

## 2023-08-21 ENCOUNTER — Encounter: Payer: Self-pay | Admitting: Family Medicine

## 2023-08-21 DIAGNOSIS — M8580 Other specified disorders of bone density and structure, unspecified site: Secondary | ICD-10-CM | POA: Diagnosis not present

## 2023-08-21 DIAGNOSIS — M85851 Other specified disorders of bone density and structure, right thigh: Secondary | ICD-10-CM | POA: Diagnosis not present

## 2023-08-21 LAB — HM DEXA SCAN

## 2023-08-21 NOTE — Telephone Encounter (Signed)
Dr. Sedalia Muta,  When and where can we schedule this patient?  Patient notified that we do not have an opening within the timeframe that he needs to be seen and will follow-up with the provider for further advisement.  Hospital: [facility] in [place] DOS: 08/20/2023 Follow-up within: 10 DAYS Msg: the patient will need her sutures removed.

## 2023-08-22 ENCOUNTER — Telehealth: Payer: Self-pay | Admitting: *Deleted

## 2023-08-22 NOTE — Telephone Encounter (Signed)
Jake Bathe, MD  Sharin Grave, RN Let's stop Coreg 3.125 BID Continue Digoxin  Hydrate See ER note.  Left message for pt to call back to discuss Dr Anne Fu' orders.

## 2023-08-22 NOTE — Telephone Encounter (Signed)
Wife aware to discontinue carvedilol and continue to monitor.  She will call back if any further questions or concerns.

## 2023-08-22 NOTE — Telephone Encounter (Signed)
Wife returned RN's call. 

## 2023-08-26 NOTE — Telephone Encounter (Signed)
Pt is scheduled on 9/6 with Dr. Faylene Kurtz. This appointment was made by Leotis Shames, CMA on 08/22/2023.

## 2023-08-26 NOTE — Progress Notes (Signed)
Cardiology Clinic Note   Date: 08/29/2023 ID: Paul [name], DOB 17-Feb-1946, MRN [id number]  Primary Cardiologist:  Donato Schultz, MD  Patient Profile    Paul [name] is a 77 y.o. male who presents to the clinic today for ED follow up.     Past medical history significant for: PAF. Onset 2020.  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 14-day ZIO 05/02/2022: Sinus rhythm average HR 77 bpm.  Rare PACs/PVCs (sometimes symptomatic).  No A-fib noted.  Brief episodes of A. tach. Coronary artery calcification. Cardiac CT 07/17/2021: Coronary calcium score 1046 (77th percentile).  CT chest 05/29/2023: Normal caliber thoracic aorta with moderate calcified plaque. Severe coronary artery calcifications.  Chronic diastolic heart failure. Echo 01/26/2023: EF 65 to 70%.  No RWMA.  Grade I DD.  Low normal RV function.  Trivial MR/AI.  Aortic valve sclerosis without stenosis. Coronary artery stenosis. Carotid duplex 04/03/2020: Bilateral ICA 1 to 39%. Hypertension. Hyperlipidemia. OSA. COPD. GERD. T2DM. Frequent falls.     History of Present Illness    [name] was first evaluated by Dr. Anne Fu via telehealth visit on 03/23/2023 for new onset afib during hospital admission for COPD exacerbation. He was started on Eliquis and diltiazem with goal to avoid BB and amiodarone secondary to lung disease. 7 day ZIO showed sinus rhythm with occasional PACs/PVCs, NSVT, and no sustained arrhythmias. He had a normal, low risk stress test and echo showed normal LV/RV function in July 2020. Patient was intolerant to anticoagulation secondary to gross hematuria. He underwent LAA occlusion with Watchman device in August 2022. He continues to be followed by Dr. Anne Fu and Dr. Lalla Brothers for the above outlined history.   Last seen by Dr. Lalla Brothers on 04/09/2023 for routine follow up. Tikosyn discussed at that time. Patient opted for continued conservative approach with possibility of starting Tikosyn with recurrence.    Patient last seen in the office by Dr. Anne Fu on 07/21/2023 for follow up. Amlodipine was stopped secondary to orthostatic hypotension.   Patient has had several hospital admissions/ED visits in 2024:  01/16/2023 to 01/19/2023 for COPD exacerbation.  02/04/2023 to 02/07/2023 for acute on chronic diastolic heart failure and acute on chronic hypoxic respiratory failure. He was noted to be in Afib with RVR and started on digoxin and carvedilol. He was in NSR upon discharge.  02/15/2023 to 02/21/2023 for acute on chronic diastolic heart failure and COPD exacerbation diuresed with IV lasix. Hospital course complicated by syncopal episode of unknown etiology on 2/27.  ED visit 02/23/2023 for mild COPD exacerbation with productive cough. Given doxycycline and discharged.  ED visit 05/12/2023: For shortness of breath. In sinus rhythm. Received breathing treatment and solumedrol prior to discharge.  ED visit 05/29/2023: For fall with shortness of breath and lightheadedness. Xray showed suspected nondisplaced left anterior 9th rib fracture. In NSR. Multiple lacerations and skin tears with suture and steri-strip repair.  ED visit 08/20/2023: For fall after being out in the heat and becoming dizzy. Laceration to head.   Patient contacted the office after last ED visit. Dr. Anne Fu instructed patient to stop carvedilol and continue digoxin.   Today, patient is accompanied by his wife. He is doing well since last ED visit. No further falls. He feels less dizzy with position changes since stopping carvedilol. He continues to get up slowly and pause before walking.  He has baseline shortness of breath secondary to COPD that is unchanged. He uses O2 as needed. No chest pain, pressure or tightness. No palpitations. He  has edema of left lower extremity since his last fall, none on the left. He reports brisk diuresis on current dose of Lasix. He took one extra dose for the left extremity edema but it did not help so he has not taken  another. He is elevating his leg as much as possible. Daily weight has been stable.  He is very active going to the gym to walk on the treadmill 3 days a week and golfing once a week.     ROS: All other systems reviewed and are otherwise negative except as noted in History of Present Illness.  Studies Reviewed       EKG is not ordered today.   Risk Assessment/Calculations     CHA2DS2-VASc Score = 6   This indicates a 9.7% annual risk of stroke. The patient's score is based upon: CHF History: 1 HTN History: 1 Diabetes History: 1 Stroke History: 0 Vascular Disease History: 1 Age Score: 2 Gender Score: 0    HYPERTENSION CONTROL Vitals:   08/29/23 0828 08/29/23 0938  BP: (!) 144/42 (!) 140/52    The patient's blood pressure is elevated above target today.  In order to address the patient's elevated BP: Blood pressure will be monitored at home to determine if medication changes need to be made.           Physical Exam    VS:  BP (!) 144/42   Pulse 84   Ht 5\' 5"  (1.651 m)   Wt 161 lb (73 kg)   SpO2 90%   BMI 26.79 kg/m  , BMI Body mass index is 26.79 kg/m.  GEN: Well nourished, well developed, in no acute distress. Neck: No JVD or carotid bruits. Cardiac:  RRR. No murmurs. No rubs or gallops.   Respiratory:  Respirations regular and unlabored. Mildly diminished breath sounds bilateral bases without rales, wheezing or rhonchi. GI: Soft, nontender, nondistended. Extremities: Radials/DP/PT 2+ and equal bilaterally. No clubbing or cyanosis. Mild to moderate nonpitting edema left lower extremity. No edema right lower extremity.   Skin: Warm and dry, no rash. Neuro: Strength intact.  Assessment & Plan    PAF. Onset 2020. S/p LAA occulusion with Watchman. 14 day Zio May 2023 showed rare PACs/PVCs and no afib. Patient denies palpitations. RRR on exam today.  Carvedilol discontinued secondary to orthostatic hypotension and frequent falls. Continue digoxin.  Coronary  artery calcification. Coronary calcium score 1046 July 2022. Patient denies chest pain, pressure or tightness.  Continue aspirin, atorvastatin.  Chronic diastolic heart failure. Echo February 2024 showed EF 65-70%, grade I DD. Patient reports left lower extremity edema since his last fall when he injured his knee. Daily weight has been stable.  Euvolemic and well compensated on exam. Continue Lasix, Farxiga. GDMT limited secondary to orthostatic hypotension.  Orthostatic hypotension/frequent falls: BP today 144/42 on intake and 140/52 on my recheck. Orthostatic vitals not performed today. Dizziness improved sine stopping carvedilol. Remain off carvedilol. Continue slow position changes.   Disposition: Return in 4 months or sooner as needed.          Signed, Etta Grandchild. Takoda Janowiak, DNP, NP-C

## 2023-08-27 DIAGNOSIS — L570 Actinic keratosis: Secondary | ICD-10-CM | POA: Diagnosis not present

## 2023-08-27 DIAGNOSIS — L578 Other skin changes due to chronic exposure to nonionizing radiation: Secondary | ICD-10-CM | POA: Diagnosis not present

## 2023-08-27 DIAGNOSIS — L814 Other melanin hyperpigmentation: Secondary | ICD-10-CM | POA: Diagnosis not present

## 2023-08-29 ENCOUNTER — Encounter: Payer: Self-pay | Admitting: Student

## 2023-08-29 ENCOUNTER — Ambulatory Visit: Payer: Medicare Other | Attending: Student | Admitting: Student

## 2023-08-29 ENCOUNTER — Ambulatory Visit (INDEPENDENT_AMBULATORY_CARE_PROVIDER_SITE_OTHER): Payer: Medicare Other

## 2023-08-29 VITALS — BP 118/40 | HR 98 | Temp 97.5°F | Resp 14 | Ht 65.0 in | Wt 162.0 lb

## 2023-08-29 VITALS — BP 140/52 | HR 84 | Ht 65.0 in | Wt 161.0 lb

## 2023-08-29 DIAGNOSIS — I2510 Atherosclerotic heart disease of native coronary artery without angina pectoris: Secondary | ICD-10-CM | POA: Diagnosis not present

## 2023-08-29 DIAGNOSIS — S71112D Laceration without foreign body, left thigh, subsequent encounter: Secondary | ICD-10-CM | POA: Diagnosis not present

## 2023-08-29 DIAGNOSIS — R296 Repeated falls: Secondary | ICD-10-CM | POA: Diagnosis not present

## 2023-08-29 DIAGNOSIS — S71112A Laceration without foreign body, left thigh, initial encounter: Secondary | ICD-10-CM | POA: Insufficient documentation

## 2023-08-29 DIAGNOSIS — I951 Orthostatic hypotension: Secondary | ICD-10-CM | POA: Insufficient documentation

## 2023-08-29 DIAGNOSIS — I2584 Coronary atherosclerosis due to calcified coronary lesion: Secondary | ICD-10-CM | POA: Insufficient documentation

## 2023-08-29 DIAGNOSIS — I5032 Chronic diastolic (congestive) heart failure: Secondary | ICD-10-CM | POA: Insufficient documentation

## 2023-08-29 DIAGNOSIS — I480 Paroxysmal atrial fibrillation: Secondary | ICD-10-CM | POA: Insufficient documentation

## 2023-08-29 DIAGNOSIS — R55 Syncope and collapse: Secondary | ICD-10-CM | POA: Insufficient documentation

## 2023-08-29 NOTE — Progress Notes (Signed)
Subjective:  Patient ID: Paul [name], male    DOB: [birth year]  Age: 77 y.o. MRN: [id number]  Chief Complaint  Patient presents with   Loss of Consciousness   Follow-up   Suture / Staple Removal    HPI   Paul [name] is here with his wife for an ED follow up.  Patient was seen at Emergency department at [facility] on 08/20/23 for Heat syncope, multiple skin tears and abrasion of the head. He presented emergency department with a syncopal episode.  Patient states that he was at a car wash and when he got out of his car he was very overheated.  He got lightheaded and then woke up on the ground.  Believes that he struck the back of his head and his back when he fell.  Denies any chest pain.   He saw cardiologist this morning and they stopped carvedilol and continue with the rest of medicine.     05/14/2023   11:23 AM 04/01/2023   11:15 AM 03/26/2023    8:54 AM 11/22/2022    9:35 AM 08/16/2022    8:20 AM  Depression screen PHQ 2/9  Decreased Interest 0 0 0 0 0  Down, Depressed, Hopeless 0 0 0 0 0  PHQ - 2 Score 0 0 0 0 0        07/23/2023    9:58 AM  Fall Risk   Falls in the past year? 1  Number falls in past yr: 0  Injury with Fall? 0  Risk for fall due to : No Fall Risks    Patient Care Team: Blane Ohara, MD as PCP - General (Family Medicine) Jake Bathe, MD as PCP - Cardiology (Cardiology) Lanier Prude, MD as PCP - Electrophysiology (Cardiology) Storm Frisk, MD as Attending Physician (Pulmonary Disease) Nyoka Cowden, MD as Consulting Physician (Pulmonary Disease) [facility] as Registered Nurse (Hospice and Palliative Medicine) Earlie Server, RN as [facility] Care Management   Review of Systems  Constitutional:  Negative for chills, fatigue, fever and unexpected weight change.  HENT:  Negative for congestion, ear pain, sinus pain and sore throat.   Respiratory:  Negative for shortness of breath.   Cardiovascular:   Negative for chest pain and palpitations.  Gastrointestinal:  Negative for abdominal pain, blood in stool, constipation, diarrhea, nausea and vomiting.  Endocrine: Negative for polydipsia.  Genitourinary:  Negative for dysuria.  Musculoskeletal:  Negative for back pain.  Skin:  Positive for wound. Negative for rash.  Neurological:  Negative for dizziness and headaches.  Hematological:  Bruises/bleeds easily.    Current Outpatient Medications on File Prior to Visit  Medication Sig Dispense Refill   Accu-Chek Softclix Lancets lancets Check fasting sugar once daily. 100 each 3   acetaminophen (TYLENOL) 500 MG tablet Take 1,000 mg by mouth at bedtime as needed (pain).     albuterol (PROAIR HFA) 108 (90 Base) MCG/ACT inhaler Inhale 2 puffs into the lungs as needed for wheezing or shortness of breath.     albuterol (PROVENTIL) (2.5 MG/3ML) 0.083% nebulizer solution Take 3 mLs (2.5 mg total) by nebulization every 6 (six) hours as needed for shortness of breath or wheezing. 360 mL 5   ALPRAZolam (XANAX) 0.5 MG tablet Take 1 tablet (0.5 mg total) by mouth 2 (two) times daily as needed for anxiety. 30 tablet 0   Ascorbic Acid (VITAMIN C) 1000 MG tablet Take 1,000 mg by mouth daily.  aspirin EC 81 MG tablet Take 1 tablet (81 mg total) by mouth daily. Swallow whole. 90 tablet 3   atorvastatin (LIPITOR) 40 MG tablet TAKE 1 TABLET BY MOUTH EVERYDAY AT BEDTIME 90 tablet 1   blood glucose meter kit and supplies KIT Dispense based on patient and insurance preference. Use up to four times daily as directed. 1 each 0   Blood Glucose Monitoring Suppl DEVI Use to test blood sugar up to twice daily. May substitute to any manufacturer covered by patient's insurance. E11.0 1 each 0   Budeson-Glycopyrrol-Formoterol (BREZTRI AEROSPHERE) 160-9-4.8 MCG/ACT AERO Inhale 2 puffs into the lungs 2 (two) times daily. 3 each 3   calcium carbonate (OS-CAL) 600 MG TABS tablet Take 600 mg by mouth daily.     Cholecalciferol  (VITAMIN D3) 50 MCG (2000 UT) TABS Take 2,000 Units by mouth in the morning.     dapagliflozin propanediol (FARXIGA) 10 MG TABS tablet Take 10 mg by mouth daily.     denosumab (PROLIA) 60 MG/ML SOSY injection Inject 60 mg into the skin every 6 (six) months. 180 mL 0   digoxin (LANOXIN) 0.125 MG tablet Take 1 tablet (0.125 mg total) by mouth daily. 90 tablet 3   ferrous sulfate 325 (65 FE) MG tablet Take 325 mg by mouth 2 (two) times daily with a meal.     fluticasone (FLONASE) 50 MCG/ACT nasal spray Place 2 sprays into both nostrils daily as needed for allergies or rhinitis.     furosemide (LASIX) 40 MG tablet Take 1 tablet by mouth daily, may take 1 extra tablet daily only as needed for weight gain or lower extremity swelling     Glucose Blood (BLOOD GLUCOSE TEST STRIPS) STRP Use to test blood sugar up to twice daily. May substitute to any manufacturer covered by patient's insurance. E11.0 100 strip 3   Lancet Device MISC Use to test blood sugar up to twice daily. May substitute to any manufacturer covered by patient's insurance. E11.0 1 each 0   Lancets Misc. MISC Use to test blood sugar up to twice daily. May substitute to any manufacturer covered by patient's insurance. E11.0 100 each 3   loratadine (CLARITIN) 10 MG tablet Take 10 mg by mouth in the morning.     Melatonin 12 MG TABS Take 12 mg by mouth at bedtime.     Multiple Vitamin (MULTIVITAMIN WITH MINERALS) TABS tablet Take 1 tablet by mouth daily with breakfast.     OXYGEN Inhale 2 L/min into the lungs at bedtime.     pantoprazole (PROTONIX) 40 MG tablet Take 1 tablet (40 mg total) by mouth 2 (two) times daily. 180 tablet 3   predniSONE (DELTASONE) 10 MG tablet Take 1 tablet (10 mg total) by mouth daily with breakfast. 90 tablet 1   Probiotic Product (PROBIOTIC DAILY) CAPS Take 1 capsule by mouth in the morning.     sertraline (ZOLOFT) 50 MG tablet TAKE 1 TABLET BY MOUTH EVERYDAY AT BEDTIME 90 tablet 1   No current facility-administered  medications on file prior to visit.   Past Medical History:  Diagnosis Date   Anemia    Aortic atherosclerosis (HCC)    Aspiration pneumonia due to inhalation of vomitus (HCC) 02/03/2022   Atrial fibrillation (HCC)    Back pain    COPD (chronic obstructive pulmonary disease) (HCC)    COPD exacerbation (HCC) 12/27/2021   COVID-19 12/24/2020   Diabetes mellitus without complication (HCC)    Dizziness 04/08/2022   Elevated PSA  GAD (generalized anxiety disorder)    GERD (gastroesophageal reflux disease)    High cholesterol    Hypertension    Lingular pneumonia 01/05/2021   See cxr 01/06/20 rx with zpak/ cefipime x 5 days and then developed purulent sputum again 01/12/21 so rx levcaquin 500 mg daily x 7 days then return for cxr before more    Lung nodule < 6cm on CT 05/29/2016   Osteoporosis    Oxygen deficiency    Pneumonia    January 2022   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 Lalla Brothers with a 20 mm Watchman FLX device   RSV bronchitis 01/27/2022   Vertebral compression fracture (HCC) 05/29/2019   T8 compression fracture noted on CT scan 05/28/2019 inpatient with chronic steroid dependent COPD - rx calcitonin nasal spray rx per PCP    Past Surgical History:  Procedure Laterality Date   CATARACT EXTRACTION Right    ELBOW SURGERY  07/2022   surgery on olecranon bursa. Dr. Yehuda Budd   IR KYPHO EA ADDL LEVEL THORACIC OR LUMBAR  11/28/2021   LEFT ATRIAL APPENDAGE OCCLUSION N/A 08/02/2021   Procedure: LEFT ATRIAL APPENDAGE OCCLUSION;  Surgeon: Tonny Bollman, MD;  Location[facility] INVASIVE CV LAB;  Service: Cardiovascular;  Laterality: N/A;   SKIN SURGERY     shoulders and chest   TEE WITHOUT CARDIOVERSION N/A 08/02/2021   Procedure: TRANSESOPHAGEAL ECHOCARDIOGRAM (TEE);  Surgeon: Tonny Bollman, MD;  Location: [facility] INVASIVE CV LAB;  Service: Cardiovascular;  Laterality: N/A;   TEE WITHOUT CARDIOVERSION N/A 09/13/2021   Procedure:  TRANSESOPHAGEAL ECHOCARDIOGRAM (TEE);  Surgeon: Thurmon Fair, MD;  Location: [facility] ENDOSCOPY;  Service: Cardiovascular;  Laterality: N/A;    Family History  Problem Relation Age of Onset   Heart disease Brother    Heart disease Mother    Heart disease Father    Cancer Paternal Uncle    Social History   Socioeconomic History   Marital status: Married    Spouse name: Not on file   Number of children: 3   Years of education: Not on file   Highest education level: 10th grade  Occupational History   Occupation: retired    Comment: truck Hospital doctor  Tobacco Use   Smoking status: Former    Current packs/day: 0.00    Average packs/day: 2.0 packs/day for 52.0 years (104.0 ttl pk-yrs)    Types: Cigarettes    Start date: 02/03/1957    Quit date: 02/03/2009    Years since quitting: 14.5   Smokeless tobacco: Never   Tobacco comments:    Counseled to remain smoke free  Vaping Use   Vaping status: Never Used  Substance and Sexual Activity   Alcohol use: Not Currently    Alcohol/week: 0.0 standard drinks of alcohol    Comment: occ   Drug use: No   Sexual activity: Not on file  Other Topics Concern   Not on file  Social History Narrative   Originally from [place]. Previously worked driving a Surveyor, minerals. No pets currently. No mold exposure. 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       wears sunscreen, brushes and flosses daily, see's dentist bi-annually, has smoke/carbon monoxide detectors, wears a seatbelt and practices gun safety      Social Determinants of Health   Financial Resource Strain: Low Risk  (07/19/2023)   Overall Financial Resource Strain (CARDIA)    Difficulty  of Paying Living Expenses: Not hard at all  Food Insecurity: No Food Insecurity (07/19/2023)   Hunger Vital Sign    Worried About Running Out of Food in the Last Year: Never true    Ran Out of Food in the Last Year: Never true  Transportation Needs: No  Transportation Needs (07/19/2023)   PRAPARE - Administrator, Civil Service (Medical): No    Lack of Transportation (Non-Medical): No  Physical Activity: Insufficiently Active (07/19/2023)   Exercise Vital Sign    Days of Exercise per Week: 3 days    Minutes of Exercise per Session: 30 min  Stress: No Stress Concern Present (07/19/2023)   [employer] of Occupational Health - Occupational Stress Questionnaire    Feeling of Stress : Not at all  Social Connections: Socially Integrated (07/19/2023)   Social Connection and Isolation Panel [NHANES]    Frequency of Communication with Friends and Family: More than three times a week    Frequency of Social Gatherings with Friends and Family: More than three times a week    Attends Religious Services: More than 4 times per year    Active Member of [employer] or Organizations: Yes    Attends Engineer, structural: More than 4 times per year    Marital Status: Married    Objective:  BP (!) 118/40   Pulse 98   Temp (!) 97.5 F (36.4 C)   Resp 14   Ht 5\' 5"  (1.651 m)   Wt 162 lb (73.5 kg)   SpO2 94%   BMI 26.96 kg/m      08/29/2023   11:02 AM 08/29/2023    9:38 AM 08/29/2023    8:28 AM  BP/Weight  Systolic BP 118 140 144  Diastolic BP 40 52 42  Wt. (Lbs) 162  161  BMI 26.96 kg/m2  26.79 kg/m2    Physical Exam HENT:     Head: Normocephalic.     Mouth/Throat:     Mouth: Mucous membranes are moist.  Cardiovascular:     Rate and Rhythm: Normal rate. Rhythm irregular.  Pulmonary:     Effort: Pulmonary effort is normal.     Breath sounds: Normal breath sounds.  Musculoskeletal:     Cervical back: Normal range of motion.  Skin:    Comments: Several bruises, wounds noted on his extremities from his falls. 4 sutures noted on the wound on left lower thigh. Sutures removed today  Neurological:     General: No focal deficit present.     Mental Status: He is alert and oriented to person, place, and time.  Psychiatric:         Mood and Affect: Mood normal.        Behavior: Behavior normal.     Diabetic Foot Exam - Simple   No data filed      Lab Results  Component Value Date   WBC 12.6 (H) 08/20/2023   HGB 11.4 (L) 08/20/2023   HCT 37.0 (L) 08/20/2023   PLT 310 08/20/2023   GLUCOSE 129 (H) 08/20/2023   CHOL 196 07/21/2023   TRIG 238 (H) 07/21/2023   HDL 56 07/21/2023   LDLCALC 100 (H) 07/21/2023   ALT 34 07/21/2023   AST 23 07/21/2023   NA 131 (L) 08/20/2023   K 4.3 08/20/2023   CL 90 (L) 08/20/2023   CREATININE 1.12 08/20/2023   BUN 20 08/20/2023   CO2 28 08/20/2023   TSH 3.540 07/21/2023   INR 0.9  01/25/2023   HGBA1C 6.6 (H) 07/21/2023   MICROALBUR Negative 10/09/2021      Assessment & Plan:    Laceration of left thigh, subsequent encounter Assessment & Plan: Laceration on his left lower thigh status post syncope and fall due to heat exhaustion 4 stitches removed from the laceration Covered back with nonadhesive Band-Aid and paper tape Abrasion on the posterior aspect of left thigh seems to be healing well, Steri-Strips noted.  Advised to be very careful and avoid falls.   Syncope and collapse Assessment & Plan: Verley had syncope and fall, possibly from heat syncope/ exhaustion on 08/20/23. Evaluated at ED in [facility]. Evaluated again by cardiology this morning for a follow up and his Carvedilol was discontinued due to his low Bps.  Blood pressure decent today.  No dizziness reported.  Plan: Advised to monitor home blood pressures, take time changing positions -To immediately report back if he has any worsening symptoms.     Total time spent on today's visit was greater than 30 minutes, including both face-to-face time and nonface-to-face time personally spent on review of chart (labs and imaging), discussing labs and goals, discussing further work-up, treatment options, referrals to specialist if needed, reviewing outside records of pertinent, answering patient's  questions, and coordinating care.  No orders of the defined types were placed in this encounter.   No orders of the defined types were placed in this encounter.    Follow-up: Return if symptoms worsen or fail to improve.   I,Marla I Leal-Borjas,acting as a scribe for [employer], MD.,have documented all relevant documentation on the behalf of Windell Moment, MD,as directed by  Windell Moment, MD while in the presence of Windell Moment, MD.   An After Visit Summary was printed and given to the patient.  Windell Moment, MD  [facility] [phone]

## 2023-08-29 NOTE — Assessment & Plan Note (Signed)
Laceration on his left lower thigh status post syncope and fall due to heat exhaustion 4 stitches removed from the laceration Covered back with nonadhesive Band-Aid and paper tape Abrasion on the posterior aspect of left thigh seems to be healing well, Steri-Strips noted.  Advised to be very careful and avoid falls.

## 2023-08-29 NOTE — Patient Instructions (Signed)
Medication Instructions:  NO changes in medication *If you need a refill on your cardiac medications before your next appointment, please call your pharmacy*   Lab Work: NONE If you have labs (blood work) drawn today and your tests are completely normal, you will receive your results only by: MyChart Message (if you have MyChart) OR A paper copy in the mail If you have any lab test that is abnormal or we need to change your treatment, we will call you to review the results.   Testing/Procedures: NONE   Follow-Up: At [facility], you and your health needs are our priority.  As part of our continuing mission to provide you with exceptional heart care, we have created designated Provider Care Teams.  These Care Teams include your primary Cardiologist (physician) and Advanced Practice Providers (APPs -  Physician Assistants and Nurse Practitioners) who all work together to provide you with the care you need, when you need it.  We recommend signing up for the patient portal called "MyChart".  Sign up information is provided on this After Visit Summary.  MyChart is used to connect with patients for Virtual Visits (Telemedicine).  Patients are able to view lab/test results, encounter notes, upcoming appointments, etc.  Non-urgent messages can be sent to your provider as well.   To learn more about what you can do with MyChart, go to [email or web address].    Your next appointment:   4 month(s)  Provider:   Donato Schultz, MD

## 2023-08-29 NOTE — Patient Instructions (Signed)
Stitches removed Please take time changing positions Sample of Breztri given Follow up as needed.

## 2023-08-29 NOTE — Assessment & Plan Note (Signed)
Janiel had syncope and fall, possibly from heat syncope/ exhaustion on 08/20/23. Evaluated at ED in [facility]. Evaluated again by cardiology this morning for a follow up and his Carvedilol was discontinued due to his low Bps.  Blood pressure decent today.  No dizziness reported.  Plan: Advised to monitor home blood pressures, take time changing positions -To immediately report back if he has any worsening symptoms.

## 2023-09-06 DIAGNOSIS — R051 Acute cough: Secondary | ICD-10-CM | POA: Diagnosis not present

## 2023-09-06 DIAGNOSIS — R0981 Nasal congestion: Secondary | ICD-10-CM | POA: Diagnosis not present

## 2023-09-06 DIAGNOSIS — J324 Chronic pansinusitis: Secondary | ICD-10-CM | POA: Diagnosis not present

## 2023-09-08 DIAGNOSIS — L97822 Non-pressure chronic ulcer of other part of left lower leg with fat layer exposed: Secondary | ICD-10-CM | POA: Diagnosis not present

## 2023-09-08 DIAGNOSIS — S81001D Unspecified open wound, right knee, subsequent encounter: Secondary | ICD-10-CM | POA: Diagnosis not present

## 2023-09-18 ENCOUNTER — Other Ambulatory Visit: Payer: Self-pay | Admitting: Family Medicine

## 2023-09-22 ENCOUNTER — Ambulatory Visit: Payer: Self-pay

## 2023-09-22 DIAGNOSIS — L03116 Cellulitis of left lower limb: Secondary | ICD-10-CM | POA: Diagnosis not present

## 2023-09-22 DIAGNOSIS — L97522 Non-pressure chronic ulcer of other part of left foot with fat layer exposed: Secondary | ICD-10-CM | POA: Diagnosis not present

## 2023-09-22 DIAGNOSIS — S81001D Unspecified open wound, right knee, subsequent encounter: Secondary | ICD-10-CM | POA: Diagnosis not present

## 2023-09-22 DIAGNOSIS — L98492 Non-pressure chronic ulcer of skin of other sites with fat layer exposed: Secondary | ICD-10-CM | POA: Diagnosis not present

## 2023-09-22 DIAGNOSIS — L97922 Non-pressure chronic ulcer of unspecified part of left lower leg with fat layer exposed: Secondary | ICD-10-CM | POA: Diagnosis not present

## 2023-09-22 NOTE — Patient Outreach (Signed)
Care Coordination   Follow Up Visit Note   09/22/2023 Name: Paul [name] MRN: [id number] DOB: 11/06/1946  Paul [name] is a 77 y.o. year old male who sees Cox, Kirsten, MD for primary care. I spoke with  Paul [name] by phone today.  What matters to the patients health and wellness today? Patient reports that he is doing really well. Reports weight is stable and no swelling.  Reports no problems with breathing at this time. States CBG today of 109.   States that [place] and reports that he is at the wound clinic now.   Denies any new problems or concerns.  No falls in the last month.    Goals Addressed               This Visit's Progress     Improve my breathing (pt-stated)          Interventions Today    Flowsheet Row Most Recent Value  Chronic Disease   Chronic disease during today's visit Diabetes, Chronic Obstructive Pulmonary Disease (COPD), Congestive Heart Failure (CHF)  General Interventions   General Interventions Discussed/Reviewed General Interventions Reviewed, Labs, Doctor Visits  Labs Hgb A1c every 3 months  Doctor Visits Discussed/Reviewed PCP  Annabell Sabal pending PCP follow up. Reviewed recent wound clinci appointment.]  PCP/Specialist Visits Compliance with follow-up visit  Exercise Interventions   Exercise Discussed/Reviewed Weight Managment  [Reviewed recent weights and assessed for swelling.]  Weight Management Weight maintenance  Education Interventions   Education Provided Provided Education  [Reviewed importance of fall preventions. Reviewed EMR with patient and ED visits and most of these are from falls.]  Provided Verbal Education On Nutrition, Foot Care, Labs, Blood Sugar Monitoring, Exercise, Medication, When to see the doctor  Mental Health Interventions   Mental Health Discussed/Reviewed Other  [Reviewed with patinet all his progress and congratulated patient on making huge improvements in his health.]   Nutrition Interventions   Nutrition Discussed/Reviewed Nutrition Discussed, Decreasing salt, Carbohydrate meal planning  Pharmacy Interventions   Pharmacy Dicussed/Reviewed Medications and their functions, Medication Adherence  Safety Interventions   Safety Discussed/Reviewed Fall Risk      Patient is self managing very well and states he would like a call back in 6 weeks.         SDOH assessments and interventions completed:  No     Care Coordination Interventions:  Yes, provided   Follow up plan: Follow up call scheduled for 11/03/2023    Encounter Outcome:  Patient Visit Completed   Lonia Chimera, RN, BSN, CEN [facility] Coordinator [phone]

## 2023-09-26 ENCOUNTER — Other Ambulatory Visit: Payer: Self-pay

## 2023-09-26 MED ORDER — BREZTRI AEROSPHERE 160-9-4.8 MCG/ACT IN AERO: 2.0000 | INHALATION_SPRAY | Freq: Two times a day (BID) | RESPIRATORY_TRACT | 3 refills | Status: DC

## 2023-09-29 DIAGNOSIS — J9610 Chronic respiratory failure, unspecified whether with hypoxia or hypercapnia: Secondary | ICD-10-CM | POA: Diagnosis not present

## 2023-09-29 DIAGNOSIS — E11622 Type 2 diabetes mellitus with other skin ulcer: Secondary | ICD-10-CM | POA: Diagnosis not present

## 2023-09-29 DIAGNOSIS — L97822 Non-pressure chronic ulcer of other part of left lower leg with fat layer exposed: Secondary | ICD-10-CM | POA: Diagnosis not present

## 2023-09-29 DIAGNOSIS — L97522 Non-pressure chronic ulcer of other part of left foot with fat layer exposed: Secondary | ICD-10-CM | POA: Diagnosis not present

## 2023-09-29 DIAGNOSIS — Z7952 Long term (current) use of systemic steroids: Secondary | ICD-10-CM | POA: Diagnosis not present

## 2023-09-29 DIAGNOSIS — Z9981 Dependence on supplemental oxygen: Secondary | ICD-10-CM | POA: Diagnosis not present

## 2023-09-29 DIAGNOSIS — E11621 Type 2 diabetes mellitus with foot ulcer: Secondary | ICD-10-CM | POA: Diagnosis not present

## 2023-09-29 DIAGNOSIS — L97812 Non-pressure chronic ulcer of other part of right lower leg with fat layer exposed: Secondary | ICD-10-CM | POA: Diagnosis not present

## 2023-09-29 DIAGNOSIS — Z87891 Personal history of nicotine dependence: Secondary | ICD-10-CM | POA: Diagnosis not present

## 2023-10-06 DIAGNOSIS — E11621 Type 2 diabetes mellitus with foot ulcer: Secondary | ICD-10-CM | POA: Diagnosis not present

## 2023-10-06 DIAGNOSIS — L97812 Non-pressure chronic ulcer of other part of right lower leg with fat layer exposed: Secondary | ICD-10-CM | POA: Diagnosis not present

## 2023-10-06 DIAGNOSIS — L97912 Non-pressure chronic ulcer of unspecified part of right lower leg with fat layer exposed: Secondary | ICD-10-CM | POA: Diagnosis not present

## 2023-10-06 DIAGNOSIS — L97822 Non-pressure chronic ulcer of other part of left lower leg with fat layer exposed: Secondary | ICD-10-CM | POA: Diagnosis not present

## 2023-10-06 DIAGNOSIS — L98492 Non-pressure chronic ulcer of skin of other sites with fat layer exposed: Secondary | ICD-10-CM | POA: Diagnosis not present

## 2023-10-06 DIAGNOSIS — L97922 Non-pressure chronic ulcer of unspecified part of left lower leg with fat layer exposed: Secondary | ICD-10-CM | POA: Diagnosis not present

## 2023-10-06 DIAGNOSIS — E11622 Type 2 diabetes mellitus with other skin ulcer: Secondary | ICD-10-CM | POA: Diagnosis not present

## 2023-10-06 DIAGNOSIS — L97522 Non-pressure chronic ulcer of other part of left foot with fat layer exposed: Secondary | ICD-10-CM | POA: Diagnosis not present

## 2023-10-06 DIAGNOSIS — J9610 Chronic respiratory failure, unspecified whether with hypoxia or hypercapnia: Secondary | ICD-10-CM | POA: Diagnosis not present

## 2023-10-07 ENCOUNTER — Other Ambulatory Visit: Payer: Self-pay | Admitting: Family Medicine

## 2023-10-14 ENCOUNTER — Telehealth: Payer: Self-pay

## 2023-10-14 NOTE — Telephone Encounter (Signed)
Spoke with Mrs. Amando, Masser will come by tomorrow to fill out the paperwork for patient assistance. Papers are upfront in the pickup folder. This will need to be filled out by the patient and we will need to fax this once the provider has completed there paper

## 2023-10-15 NOTE — Telephone Encounter (Signed)
Patient approved for 2025 Enrollment with [employer] Patient Assistance Program for [place], [place] will end on December 22, 2024.  [id number]

## 2023-10-16 ENCOUNTER — Other Ambulatory Visit: Payer: Self-pay

## 2023-10-16 MED ORDER — BREZTRI AEROSPHERE 160-9-4.8 MCG/ACT IN AERO: 2.0000 | INHALATION_SPRAY | Freq: Two times a day (BID) | RESPIRATORY_TRACT | 4 refills | Status: DC

## 2023-10-16 NOTE — Progress Notes (Signed)
Refill requested from [place] for patient assistance

## 2023-10-24 ENCOUNTER — Other Ambulatory Visit: Payer: Self-pay

## 2023-10-24 DIAGNOSIS — E1169 Type 2 diabetes mellitus with other specified complication: Secondary | ICD-10-CM

## 2023-10-24 MED ORDER — DAPAGLIFLOZIN PROPANEDIOL 10 MG PO TABS
10.0000 mg | ORAL_TABLET | Freq: Every day | ORAL | 4 refills | Status: DC
Start: 2023-10-24 — End: 2023-12-29

## 2023-10-27 ENCOUNTER — Other Ambulatory Visit: Payer: Medicare Other

## 2023-10-27 ENCOUNTER — Telehealth: Payer: Self-pay

## 2023-10-27 DIAGNOSIS — E782 Mixed hyperlipidemia: Secondary | ICD-10-CM | POA: Diagnosis not present

## 2023-10-27 DIAGNOSIS — E1169 Type 2 diabetes mellitus with other specified complication: Secondary | ICD-10-CM | POA: Diagnosis not present

## 2023-10-27 LAB — COMPREHENSIVE METABOLIC PANEL
ALT: 39 [IU]/L (ref 0–44)
AST: 24 [IU]/L (ref 0–40)
Albumin: 4.1 g/dL (ref 3.8–4.8)
Alkaline Phosphatase: 119 [IU]/L (ref 44–121)
BUN/Creatinine Ratio: 15 (ref 10–24)
BUN: 14 mg/dL (ref 8–27)
Bilirubin Total: <0.2 mg/dL (ref 0.0–1.2)
CO2: 26 mmol/L (ref 20–29)
Calcium: 9.6 mg/dL (ref 8.6–10.2)
Chloride: 100 mmol/L (ref 96–106)
Creatinine, Ser: 0.93 mg/dL (ref 0.76–1.27)
Globulin, Total: 2.5 g/dL (ref 1.5–4.5)
Glucose: 112 mg/dL — ABNORMAL HIGH (ref 70–99)
Potassium: 4.8 mmol/L (ref 3.5–5.2)
Sodium: 144 mmol/L (ref 134–144)
Total Protein: 6.6 g/dL (ref 6.0–8.5)
eGFR: 84 mL/min/{1.73_m2} (ref >59)

## 2023-10-27 LAB — CBC WITH DIFFERENTIAL/PLATELET
Basophils Absolute: 0 10*3/uL (ref 0.0–0.2)
Basos: 0 %
EOS (ABSOLUTE): 0.2 10*3/uL (ref 0.0–0.4)
Eos: 2 %
Hematocrit: 40 % (ref 37.5–51.0)
Hemoglobin: 11.9 g/dL — ABNORMAL LOW (ref 13.0–17.7)
Immature Grans (Abs): 0 10*3/uL (ref 0.0–0.1)
Immature Granulocytes: 0 %
Lymphocytes Absolute: 2.5 10*3/uL (ref 0.7–3.1)
Lymphs: 21 %
MCH: 23.1 pg — ABNORMAL LOW (ref 26.6–33.0)
MCHC: 29.8 g/dL — ABNORMAL LOW (ref 31.5–35.7)
MCV: 78 fL — ABNORMAL LOW (ref 79–97)
Monocytes Absolute: 0.8 10*3/uL (ref 0.1–0.9)
Monocytes: 7 %
Neutrophils Absolute: 8.3 10*3/uL — ABNORMAL HIGH (ref 1.4–7.0)
Neutrophils: 70 %
Platelets: 334 10*3/uL (ref 150–450)
RBC: 5.16 x10E6/uL (ref 4.14–5.80)
RDW: 14.5 % (ref 11.6–15.4)
WBC: 11.9 10*3/uL — ABNORMAL HIGH (ref 3.4–10.8)

## 2023-10-27 LAB — HEMOGLOBIN A1C
Est. average glucose Bld gHb Est-mCnc: 146 mg/dL
Hgb A1c MFr Bld: 6.7 % — ABNORMAL HIGH (ref 4.8–5.6)

## 2023-10-27 LAB — LIPID PANEL
Chol/HDL Ratio: 3 ratio (ref 0.0–5.0)
Cholesterol, Total: 169 mg/dL (ref 100–199)
HDL: 57 mg/dL (ref >39)
LDL Chol Calc (NIH): 88 mg/dL (ref 0–99)
Triglycerides: 137 mg/dL (ref 0–149)
VLDL Cholesterol Cal: 24 mg/dL (ref 5–40)

## 2023-10-27 NOTE — Telephone Encounter (Signed)
[place] Notification:  Patient approved for 2025 Enrollment with [employer] Patient Assistance Program for [place] and [place], enrollment will end on December 22, 2024.  [id number]

## 2023-10-30 ENCOUNTER — Ambulatory Visit: Payer: Medicare Other | Admitting: Family Medicine

## 2023-10-30 VITALS — BP 136/54 | HR 83 | Temp 97.8°F | Ht 65.0 in | Wt 159.0 lb

## 2023-10-30 DIAGNOSIS — E1159 Type 2 diabetes mellitus with other circulatory complications: Secondary | ICD-10-CM

## 2023-10-30 DIAGNOSIS — I4891 Unspecified atrial fibrillation: Secondary | ICD-10-CM | POA: Diagnosis not present

## 2023-10-30 DIAGNOSIS — I152 Hypertension secondary to endocrine disorders: Secondary | ICD-10-CM | POA: Diagnosis not present

## 2023-10-30 DIAGNOSIS — J449 Chronic obstructive pulmonary disease, unspecified: Secondary | ICD-10-CM

## 2023-10-30 DIAGNOSIS — F411 Generalized anxiety disorder: Secondary | ICD-10-CM | POA: Diagnosis not present

## 2023-10-30 DIAGNOSIS — E782 Mixed hyperlipidemia: Secondary | ICD-10-CM | POA: Diagnosis not present

## 2023-10-30 DIAGNOSIS — M810 Age-related osteoporosis without current pathological fracture: Secondary | ICD-10-CM

## 2023-10-30 DIAGNOSIS — K219 Gastro-esophageal reflux disease without esophagitis: Secondary | ICD-10-CM | POA: Diagnosis not present

## 2023-10-30 DIAGNOSIS — Z23 Encounter for immunization: Secondary | ICD-10-CM

## 2023-10-30 DIAGNOSIS — G4733 Obstructive sleep apnea (adult) (pediatric): Secondary | ICD-10-CM | POA: Diagnosis not present

## 2023-10-30 DIAGNOSIS — E1169 Type 2 diabetes mellitus with other specified complication: Secondary | ICD-10-CM | POA: Diagnosis not present

## 2023-10-30 MED ORDER — DENOSUMAB 60 MG/ML ~~LOC~~ SOSY
60.0000 mg | PREFILLED_SYRINGE | Freq: Once | SUBCUTANEOUS | Status: AC
Start: 2023-10-30 — End: 2023-10-30
  Administered 2023-10-30: 60 mg via SUBCUTANEOUS

## 2023-10-30 NOTE — Assessment & Plan Note (Signed)
Control: good Recommend check sugars fasting daily. Recommend check feet daily. Recommend annual eye exams. Medicines: Farxiga 10 mg daily. Continue to work on eating a healthy diet.  Labs reviewed

## 2023-10-30 NOTE — Assessment & Plan Note (Addendum)
The current medical regimen is effective;  continue present plan and medications.  Required 2 L of oxygen with Nocturnal CPAP at 9 cm H2O.

## 2023-10-30 NOTE — Assessment & Plan Note (Signed)
Patient's BP is well controlled today.  Continue amlodipine 2.5mg  Continue Coreg 3.125mg  BID

## 2023-10-30 NOTE — Assessment & Plan Note (Signed)
Continue zoloft 50 mg once daily and xanax once daily as needed.Marland Kitchen

## 2023-10-30 NOTE — Assessment & Plan Note (Signed)
Well controlled.  No changes to medicines. Continue lipitor 40 mg before bed.  Continue to work on eating a healthy diet. Labs reviewed.

## 2023-10-30 NOTE — Assessment & Plan Note (Signed)
The current medical regimen is effective;  continue present plan and medications. Continue protonix 40 mg twice daily.

## 2023-10-30 NOTE — Progress Notes (Signed)
Subjective:  Patient ID: Paul [name], male    DOB: 06/21/1946  Age: 77 y.o. MRN: [id number]  Chief Complaint  Patient presents with   Medical Management of Chronic Issues    HPI Diabetes:  Complications: none Glucose checking: daily  Glucose logs: 96-129 Hypoglycemia: no  Most recent A1C: 6.7 Current medications:  Farxiga 10 mg daily Last Eye Exam: UTD Foot checks: daily   Hyperlipidemia: Current medications: on lipitor 40 mg once daily.    Hypertension/Atrial fibrillation: Current medications: Norvasc 2.5 mg daily, Lasix 40 mg DAILY.   COPD/Chronic respiratory failure with hypoxia: ON breztri 2 puffs twice daily. ALBUTEROL NEBULIZERS TWICE DAILY. On home oxygen 2 L at night..   Mild obstructive sleep apnea with AHI at 13.9 and SpO2 low at 93%.  Required 2 L of oxygen with Nocturnal CPAP at 9 cm H2O.    GERD: on protonix 40 mg twice daily.   GAD: on zoloft 50 mg once daily. Denies depression or anxiety. Has alprazolam once daily as needed. Very sparingly.    Osteoporosis: on prolia every 6 months and vitamin d 2000 U daily   Diet: Fairly healthy.   Last AWV 04/01/23     05/14/2023   11:23 AM 04/01/2023   11:15 AM 03/26/2023    8:54 AM 11/22/2022    9:35 AM 08/16/2022    8:20 AM  Depression screen PHQ 2/9  Decreased Interest 0 0 0 0 0  Down, Depressed, Hopeless 0 0 0 0 0  PHQ - 2 Score 0 0 0 0 0        07/23/2023    9:58 AM  Fall Risk   Falls in the past year? 1  Number falls in past yr: 0  Injury with Fall? 0  Risk for fall due to : No Fall Risks    Patient Care Team: Blane Ohara, MD as PCP - General (Family Medicine) Jake Bathe, MD as PCP - Cardiology (Cardiology) Lanier Prude, MD as PCP - Electrophysiology (Cardiology) Storm Frisk, MD as Attending Physician (Pulmonary Disease) Nyoka Cowden, MD as Consulting Physician (Pulmonary Disease) [facility] as Registered Nurse (Hospice and Palliative Medicine) Fleeta Emmer, RN  as [facility] Care Management   Review of Systems  Constitutional:  Negative for chills, diaphoresis, fatigue and fever.  HENT:  Negative for congestion, ear pain and sore throat.   Respiratory:  Negative for cough and shortness of breath.   Cardiovascular:  Negative for chest pain and leg swelling.  Gastrointestinal:  Negative for abdominal pain, constipation, diarrhea, nausea and vomiting.  Genitourinary:  Negative for dysuria and urgency.  Musculoskeletal:  Negative for arthralgias and myalgias.  Neurological:  Negative for dizziness and headaches.  Psychiatric/Behavioral:  Negative for dysphoric mood.     Current Outpatient Medications on File Prior to Visit  Medication Sig Dispense Refill   Accu-Chek Softclix Lancets lancets Check fasting sugar once daily. 100 each 3   acetaminophen (TYLENOL) 500 MG tablet Take 1,000 mg by mouth at bedtime as needed (pain).     albuterol (PROAIR HFA) 108 (90 Base) MCG/ACT inhaler Inhale 2 puffs into the lungs as needed for wheezing or shortness of breath.     albuterol (PROVENTIL) (2.5 MG/3ML) 0.083% nebulizer solution Take 3 mLs (2.5 mg total) by nebulization every 6 (six) hours as needed for shortness of breath or wheezing. 360 mL 5   ALPRAZolam (XANAX) 0.5 MG tablet Take 1 tablet (0.5 mg total) by mouth  2 (two) times daily as needed for anxiety. 30 tablet 0   Ascorbic Acid (VITAMIN C) 1000 MG tablet Take 1,000 mg by mouth daily.     aspirin EC 81 MG tablet Take 1 tablet (81 mg total) by mouth daily. Swallow whole. 90 tablet 3   atorvastatin (LIPITOR) 40 MG tablet TAKE 1 TABLET BY MOUTH EVERYDAY AT BEDTIME 90 tablet 1   blood glucose meter kit and supplies KIT Dispense based on patient and insurance preference. Use up to four times daily as directed. 1 each 0   Blood Glucose Monitoring Suppl DEVI Use to test blood sugar up to twice daily. May substitute to any manufacturer covered by patient's insurance. E11.0 1 each 0    Budeson-Glycopyrrol-Formoterol (BREZTRI AEROSPHERE) 160-9-4.8 MCG/ACT AERO Inhale 2 puffs into the lungs 2 (two) times daily. 3 each 4   calcium carbonate (OS-CAL) 600 MG TABS tablet Take 600 mg by mouth daily.     Cholecalciferol (VITAMIN D3) 50 MCG (2000 UT) TABS Take 2,000 Units by mouth in the morning.     dapagliflozin propanediol (FARXIGA) 10 MG TABS tablet Take 1 tablet (10 mg total) by mouth daily. 90 tablet 4   denosumab (PROLIA) 60 MG/ML SOSY injection Inject 60 mg into the skin every 6 (six) months. 180 mL 0   digoxin (LANOXIN) 0.125 MG tablet Take 1 tablet (0.125 mg total) by mouth daily. 90 tablet 3   ferrous sulfate 325 (65 FE) MG tablet Take 325 mg by mouth 2 (two) times daily with a meal.     fluticasone (FLONASE) 50 MCG/ACT nasal spray Place 2 sprays into both nostrils daily as needed for allergies or rhinitis.     furosemide (LASIX) 40 MG tablet Take 1 tablet by mouth daily, may take 1 extra tablet daily only as needed for weight gain or lower extremity swelling     Glucose Blood (BLOOD GLUCOSE TEST STRIPS) STRP Use to test blood sugar up to twice daily. May substitute to any manufacturer covered by patient's insurance. E11.0 100 strip 3   Lancet Device MISC Use to test blood sugar up to twice daily. May substitute to any manufacturer covered by patient's insurance. E11.0 1 each 0   Lancets Misc. MISC Use to test blood sugar up to twice daily. May substitute to any manufacturer covered by patient's insurance. E11.0 100 each 3   loratadine (CLARITIN) 10 MG tablet Take 10 mg by mouth in the morning.     Melatonin 12 MG TABS Take 12 mg by mouth at bedtime.     Multiple Vitamin (MULTIVITAMIN WITH MINERALS) TABS tablet Take 1 tablet by mouth daily with breakfast.     OXYGEN Inhale 2 L/min into the lungs at bedtime.     pantoprazole (PROTONIX) 40 MG tablet Take 1 tablet (40 mg total) by mouth 2 (two) times daily. 180 tablet 3   predniSONE (DELTASONE) 10 MG tablet Take 1 tablet (10 mg  total) by mouth daily with breakfast. 90 tablet 1   Probiotic Product (PROBIOTIC DAILY) CAPS Take 1 capsule by mouth in the morning.     sertraline (ZOLOFT) 50 MG tablet TAKE 1 TABLET BY MOUTH EVERYDAY AT BEDTIME 90 tablet 1   No current facility-administered medications on file prior to visit.   Past Medical History:  Diagnosis Date   Anemia    Aortic atherosclerosis (HCC)    Aspiration pneumonia due to inhalation of vomitus (HCC) 02/03/2022   Atrial fibrillation (HCC)    Back pain  COPD (chronic obstructive pulmonary disease) (HCC)    COPD exacerbation (HCC) 12/27/2021   COVID-19 12/24/2020   Diabetes mellitus without complication (HCC)    Dizziness 04/08/2022   Elevated PSA    GAD (generalized anxiety disorder)    GERD (gastroesophageal reflux disease)    High cholesterol    Hypertension    Lingular pneumonia 01/05/2021   See cxr 01/06/20 rx with zpak/ cefipime x 5 days and then developed purulent sputum again 01/12/21 so rx levcaquin 500 mg daily x 7 days then return for cxr before more    Lung nodule < 6cm on CT 05/29/2016   Osteoporosis    Oxygen deficiency    Pneumonia    January 2022   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 Lalla Brothers with a 20 mm Watchman FLX device   RSV bronchitis 01/27/2022   Vertebral compression fracture (HCC) 05/29/2019   T8 compression fracture noted on CT scan 05/28/2019 inpatient with chronic steroid dependent COPD - rx calcitonin nasal spray rx per PCP    Past Surgical History:  Procedure Laterality Date   CATARACT EXTRACTION Right    ELBOW SURGERY  07/2022   surgery on olecranon bursa. Dr. Yehuda Budd   IR KYPHO EA ADDL LEVEL THORACIC OR LUMBAR  11/28/2021   LEFT ATRIAL APPENDAGE OCCLUSION N/A 08/02/2021   Procedure: LEFT ATRIAL APPENDAGE OCCLUSION;  Surgeon: Tonny Bollman, MD;  Location: [facility] INVASIVE CV LAB;  Service: Cardiovascular;  Laterality: N/A;   SKIN SURGERY     shoulders and chest   TEE  WITHOUT CARDIOVERSION N/A 08/02/2021   Procedure: TRANSESOPHAGEAL ECHOCARDIOGRAM (TEE);  Surgeon: Tonny Bollman, MD;  Location: [facility] INVASIVE CV LAB;  Service: Cardiovascular;  Laterality: N/A;   TEE WITHOUT CARDIOVERSION N/A 09/13/2021   Procedure: TRANSESOPHAGEAL ECHOCARDIOGRAM (TEE);  Surgeon: Thurmon Fair, MD;  Location[facility] ENDOSCOPY;  Service: Cardiovascular;  Laterality: N/A;    Family History  Problem Relation Age of Onset   Heart disease Brother    Heart disease Mother    Heart disease Father    Cancer Paternal Uncle    Social History   Socioeconomic History   Marital status: Married    Spouse name: Not on file   Number of children: 3   Years of education: Not on file   Highest education level: 10th grade  Occupational History   Occupation: retired    Comment: truck Hospital doctor  Tobacco Use   Smoking status: Former    Current packs/day: 0.00    Average packs/day: 2.0 packs/day for 52.0 years (104.0 ttl pk-yrs)    Types: Cigarettes    Start date: 02/03/1957    Quit date: 02/03/2009    Years since quitting: 14.7   Smokeless tobacco: Never   Tobacco comments:    Counseled to remain smoke free  Vaping Use   Vaping status: Never Used  Substance and Sexual Activity   Alcohol use: Not Currently    Alcohol/week: 0.0 standard drinks of alcohol    Comment: occ   Drug use: No   Sexual activity: Not on file  Other Topics Concern   Not on file  Social History Narrative   Originally from [place]. Previously worked driving a Surveyor, minerals. No pets currently. No mold exposure. 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       wears sunscreen, brushes and flosses daily, see's dentist bi-annually, has smoke/carbon monoxide detectors,  wears a seatbelt and practices gun safety      Social Determinants of Health   Financial Resource Strain: Low Risk  (07/19/2023)   Overall Financial Resource Strain (CARDIA)    Difficulty of  Paying Living Expenses: Not hard at all  Food Insecurity: No Food Insecurity (07/19/2023)   Hunger Vital Sign    Worried About Running Out of Food in the Last Year: Never true    Ran Out of Food in the Last Year: Never true  Transportation Needs: No Transportation Needs (07/19/2023)   PRAPARE - Administrator, Civil Service (Medical): No    Lack of Transportation (Non-Medical): No  Physical Activity: Insufficiently Active (07/19/2023)   Exercise Vital Sign    Days of Exercise per Week: 3 days    Minutes of Exercise per Session: 30 min  Stress: No Stress Concern Present (07/19/2023)   [employer] of Occupational Health - Occupational Stress Questionnaire    Feeling of Stress : Not at all  Social Connections: Socially Integrated (07/19/2023)   Social Connection and Isolation Panel [NHANES]    Frequency of Communication with Friends and Family: More than three times a week    Frequency of Social Gatherings with Friends and Family: More than three times a week    Attends Religious Services: More than 4 times per year    Active Member of [employer] or Organizations: Yes    Attends Engineer, structural: More than 4 times per year    Marital Status: Married    Objective:  BP (!) 136/54   Pulse 83   Temp 97.8 F (36.6 C)   Ht 5\' 5"  (1.651 m)   Wt 159 lb (72.1 kg)   SpO2 95%   BMI 26.46 kg/m      10/30/2023   10:57 AM 09/22/2023    9:02 AM 08/29/2023   11:02 AM  BP/Weight  Systolic BP 136  409  Diastolic BP 54  40  Wt. (Lbs) 159 157 162  BMI 26.46 kg/m2 26.13 kg/m2 26.96 kg/m2    Physical Exam Vitals reviewed.  Constitutional:      Appearance: Normal appearance.  Neck:     Vascular: No carotid bruit.  Cardiovascular:     Rate and Rhythm: Normal rate and regular rhythm.     Heart sounds: Normal heart sounds.  Pulmonary:     Effort: Pulmonary effort is normal.     Breath sounds: Normal breath sounds. No wheezing, rhonchi or rales.  Abdominal:      General: Bowel sounds are normal.     Palpations: Abdomen is soft.     Tenderness: There is no abdominal tenderness.  Neurological:     Mental Status: He is alert.  Psychiatric:        Mood and Affect: Mood normal.        Behavior: Behavior normal.     Diabetic Foot Exam - Simple   Simple Foot Form  10/30/2023  8:40 PM  Visual Inspection No deformities, no ulcerations, no other skin breakdown bilaterally: Yes Sensation Testing Intact to touch and monofilament testing bilaterally: Yes Pulse Check Posterior Tibialis and Dorsalis pulse intact bilaterally: Yes Comments      Lab Results  Component Value Date   WBC 11.9 (H) 10/27/2023   HGB 11.9 (L) 10/27/2023   HCT 40.0 10/27/2023   PLT 334 10/27/2023   GLUCOSE 112 (H) 10/27/2023   CHOL 169 10/27/2023   TRIG 137 10/27/2023   HDL 57 10/27/2023  LDLCALC 88 10/27/2023   ALT 39 10/27/2023   AST 24 10/27/2023   NA 144 10/27/2023   K 4.8 10/27/2023   CL 100 10/27/2023   CREATININE 0.93 10/27/2023   BUN 14 10/27/2023   CO2 26 10/27/2023   TSH 3.540 07/21/2023   INR 0.9 01/25/2023   HGBA1C 6.7 (H) 10/27/2023   MICROALBUR Negative 10/09/2021      Assessment & Plan:    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HCC) Assessment & Plan: Control: good Recommend check sugars fasting daily. Recommend check feet daily. Recommend annual eye exams. Medicines: Farxiga 10 mg daily. Continue to work on eating a healthy diet.  Labs reviewed   Mixed hyperlipidemia Assessment & Plan: Well controlled.  No changes to medicines. Continue lipitor 40 mg before bed.  Continue to work on eating a healthy diet. Labs reviewed.   Hypertension associated with diabetes (HCC) Assessment & Plan: Patient's BP is well controlled today.  Continue amlodipine 2.5mg  Continue Coreg 3.125mg  BID   Gastroesophageal reflux disease, unspecified whether esophagitis present Assessment & Plan: The current medical regimen is effective;   continue present plan and medications. Continue protonix 40 mg twice daily.    GAD (generalized anxiety disorder) Assessment & Plan: Continue zoloft 50 mg once daily and xanax once daily as needed..    OSA (obstructive sleep apnea) Assessment & Plan: The current medical regimen is effective;  continue present plan and medications.  Required 2 L of oxygen with Nocturnal CPAP at 9 cm H2O.     Encounter for immunization -     Flu Vaccine Trivalent High Dose (Fluad) -     Pfizer Comirnaty Covid-19 Vaccine 74yrs & older  Age-related osteoporosis without current pathological fracture Assessment & Plan: Prolia shot was given at the office.  Orders: -     Denosumab     Meds ordered this encounter  Medications   denosumab (PROLIA) injection 60 mg    Order Specific Question:   Patient is enrolled in [facility] program for this medication and I have provided a copy of the Prolia Medication Guide and Patient Brochure.    Answer:   No    Order Specific Question:   I have reviewed with the patient the information in the Prolia Medication Guide and Patient Counseling Chart including the serious risks of Prolia and symptoms of each risk.    Answer:   Yes    Order Specific Question:   I have advised the patient to seek medical attention if they have signs or symptoms of any of the serious risks.    Answer:   Yes    Orders Placed This Encounter  Procedures   Flu Vaccine Trivalent High Dose (Fluad)   Pfizer Comirnaty Covid-19 Vaccine 52yrs & older     Follow-up: Return in about 3 months (around 01/30/2024) for chronic follow up.   I,Katherina A Bramblett,acting as a scribe for Blane Ohara, MD.,have documented all relevant documentation on the behalf of Blane Ohara, MD,as directed by  Blane Ohara, MD while in the presence of Blane Ohara, MD.   An After Visit Summary was printed and given to the patient.  I attest that I have reviewed this visit and agree with the plan scribed by my  staff.   Blane Ohara, MD [facility] [phone]

## 2023-11-02 ENCOUNTER — Encounter: Payer: Self-pay | Admitting: Family Medicine

## 2023-11-02 NOTE — Assessment & Plan Note (Signed)
Prolia shot was given at the office.

## 2023-11-03 ENCOUNTER — Ambulatory Visit: Payer: Self-pay

## 2023-11-03 NOTE — Patient Outreach (Signed)
  Care Coordination   Follow Up Visit Note   11/03/2023 Name: Paul [name] MRN: [id number] DOB: [birth year]  Paul [name] is a 77 y.o. year old male who sees Cox, Kirsten, MD for primary care. I spoke with  Paul [name] by phone today.  What matters to the patients health and wellness today?  Maintain health    Goals Addressed               This Visit's Progress     COMPLETED: Improve my breathing (pt-stated)        Interventions Today    Flowsheet Row Most Recent Value  Chronic Disease   Chronic disease during today's visit Diabetes, Chronic Obstructive Pulmonary Disease (COPD)  General Interventions   General Interventions Discussed/Reviewed General Interventions Reviewed, Doctor Visits  Doctor Visits Discussed/Reviewed Doctor Visits Discussed, PCP  PCP/Specialist Visits Compliance with follow-up visit  Exercise Interventions   Exercise Discussed/Reviewed Exercise Discussed  Education Interventions   Education Provided Provided Education  Provided Verbal Education On Nutrition, Blood Sugar Monitoring, Exercise, Other  [Discussed COPD management and diabetes management]  Nutrition Interventions   Nutrition Discussed/Reviewed Nutrition Discussed, Carbohydrate meal planning, Portion sizes, Decreasing sugar intake       Interventions Today    Flowsheet Row Most Recent Value  Chronic Disease   Chronic disease during today's visit Diabetes, Chronic Obstructive Pulmonary Disease (COPD)  General Interventions   General Interventions Discussed/Reviewed General Interventions Reviewed, Doctor Visits  Doctor Visits Discussed/Reviewed Doctor Visits Discussed, PCP  PCP/Specialist Visits Compliance with follow-up visit  Exercise Interventions   Exercise Discussed/Reviewed Exercise Discussed  Education Interventions   Education Provided Provided Education  Provided Verbal Education On Nutrition, Blood Sugar Monitoring, Exercise, Other  [Discussed COPD management and diabetes  management]  Nutrition Interventions   Nutrition Discussed/Reviewed Nutrition Discussed, Carbohydrate meal planning, Portion sizes, Decreasing sugar intake      Patient is self managing very well and declines furhter CM outreach        SDOH assessments and interventions completed:  Yes     Care Coordination Interventions:  Yes, provided   Follow up plan: No further intervention required.   Encounter Outcome:  Patient Visit Completed   Paul Leriche, RN, MSN [facility]  [facility] Management Community Coordinator Direct Dial: [phone]  Fax: [phone] Website: [email or web address]

## 2023-11-03 NOTE — Patient Instructions (Signed)
Visit Information  Thank you for taking time to visit with me today. Please don't hesitate to contact me if I can be of assistance to you.   Following are the goals we discussed today:   Goals Addressed               This Visit's Progress     COMPLETED: Improve my breathing (pt-stated)        Interventions Today    Flowsheet Row Most Recent Value  Chronic Disease   Chronic disease during today's visit Diabetes, Chronic Obstructive Pulmonary Disease (COPD)  General Interventions   General Interventions Discussed/Reviewed General Interventions Reviewed, Doctor Visits  Doctor Visits Discussed/Reviewed Doctor Visits Discussed, PCP  PCP/Specialist Visits Compliance with follow-up visit  Exercise Interventions   Exercise Discussed/Reviewed Exercise Discussed  Education Interventions   Education Provided Provided Education  Provided Verbal Education On Nutrition, Blood Sugar Monitoring, Exercise, Other  [Discussed COPD management and diabetes management]  Nutrition Interventions   Nutrition Discussed/Reviewed Nutrition Discussed, Carbohydrate meal planning, Portion sizes, Decreasing sugar intake       Interventions Today    Flowsheet Row Most Recent Value  Chronic Disease   Chronic disease during today's visit Diabetes, Chronic Obstructive Pulmonary Disease (COPD)  General Interventions   General Interventions Discussed/Reviewed General Interventions Reviewed, Doctor Visits  Doctor Visits Discussed/Reviewed Doctor Visits Discussed, PCP  PCP/Specialist Visits Compliance with follow-up visit  Exercise Interventions   Exercise Discussed/Reviewed Exercise Discussed  Education Interventions   Education Provided Provided Education  Provided Verbal Education On Nutrition, Blood Sugar Monitoring, Exercise, Other  [Discussed COPD management and diabetes management]  Nutrition Interventions   Nutrition Discussed/Reviewed Nutrition Discussed, Carbohydrate meal planning, Portion sizes,  Decreasing sugar intake      Patient is self managing very well and declines furhter CM outreach          If you are experiencing a Mental Health or Behavioral Health Crisis or need someone to talk to, please call the Suicide and Crisis Lifeline: 988   Patient verbalizes understanding of instructions and care plan provided today and agrees to view in MyChart. Active MyChart status and patient understanding of how to access instructions and care plan via MyChart confirmed with patient.     The patient has been provided with contact information for the care management team and has been advised to call with any health related questions or concerns.   Bary Leriche, RN, MSN [facility], [place] [facility] Community Coordinator Direct Dial: [phone]  Fax: [phone] Website: [email or web address]

## 2023-11-10 ENCOUNTER — Ambulatory Visit (INDEPENDENT_AMBULATORY_CARE_PROVIDER_SITE_OTHER): Payer: Medicare Other | Admitting: Adult Health

## 2023-11-10 ENCOUNTER — Encounter: Payer: Self-pay | Admitting: Adult Health

## 2023-11-10 VITALS — BP 150/60 | HR 85 | Ht 65.0 in | Wt 160.0 lb

## 2023-11-10 DIAGNOSIS — J9611 Chronic respiratory failure with hypoxia: Secondary | ICD-10-CM | POA: Diagnosis not present

## 2023-11-10 DIAGNOSIS — J449 Chronic obstructive pulmonary disease, unspecified: Secondary | ICD-10-CM | POA: Diagnosis not present

## 2023-11-10 DIAGNOSIS — I5032 Chronic diastolic (congestive) heart failure: Secondary | ICD-10-CM | POA: Diagnosis not present

## 2023-11-10 DIAGNOSIS — G4733 Obstructive sleep apnea (adult) (pediatric): Secondary | ICD-10-CM | POA: Diagnosis not present

## 2023-11-10 NOTE — Patient Instructions (Addendum)
Continue on BREZTRI 2 puffs Twice daily, rinse after use.  Continue on Prednisone 10mg  daily Mucinex Twice daily  As needed  Cough/congestion  Flutter valve Three times a day  .  Albuterol Neb or inhaler as needed  Wear Oxygen 2l/m  with activity and At bedtime   to maintain O2 sats >88-90%.  Activity as tolerated.  Aspirations precautions as discussed.  Daily weights Continue on Lasix as directed.  Continue on CPAP At bedtime with Oxygen 2l/m  and with naps.  Follow up with Dr. Sherene Sires  Or Loni Delbridge NP in 3 months with PFT-Spirometry with DLCO and As needed

## 2023-11-10 NOTE — Progress Notes (Signed)
@Patient  ID: Paul [name], male    DOB: Nov 16, 1946, 77 y.o.   MRN: [id number]  Chief Complaint  Patient presents with   Follow-up  Discussed the use of AI scribe software for clinical note transcription with the patient, who gave verbal consent to proceed.  Referring provider: Blane Ohara, MD  HPI: 77 year old male former smoker quit in 2010 followed for severe COPD with emphysema, steroid dependent on prednisone 10 mg daily, chronic respiratory failure on oxygen and sleep apnea on CPAP with oxygen Prone to recurrent COPD exacerbations and hospitalizations Medical history significant for diastolic heart failureatrial fibrillation not on anticoagulation therapy due to history of severe bleeding secondary to hematuria, balance issues and frequent falls. Intolerant to Daliresp COVID-19 infection January 2022 required hospitalization Hospitalization February 2023 for rhinovirus with COPD exacerbation Hospitalized March 2023 and July 2023 for COPD exacerbation March 2023 outpatient ABG without hypercarbia unable to qualify for nocturnal BiPAP or NIV  TEST/EVENTS :  05/29/16: FVC 2.08 L (86%) FEV1 0.83 L (31%) FEV1/FVC 0.40 FEF 25-75 0.30 L (14%) no bronchodilator response TLC 6.56 L (107%) RV 167% ERV 160% DLCO corrected 60% (hemoglobin 10.9) 07/15/13: FVC 2.68 L (60%) FEV1 1.63 L (53%) FEV1/FVC 0.61 FEF 25-75 0.76 L (26%) 02/04/12: FVC 2.18 L (57%) FEV1 1.09 L (36%) FEV1/FVC 0.50 FEF 25-75 0.33 L (11%)     CT sinus February 2021 no acute sinus disease noted CT chest February 2021 emphysematous changes, several old thoracic compression fractures   2D echo July 2020 EF 60-65%, 2D echo September 13, 2021 EF 60-65%, Watchman device,, grade 3 moderate layered plaque involving the descending aorta   Palliative care started 2021 (nurse comes every 2 weeks)   NPSG March 11, 2023 showed Mild obstructive sleep apnea with AHI at 13.9 and SpO2 low at 93%.  Required 2 L of oxygen with CPAP at 9  cm H2O.    CT chest May 29, 2023 showed severe emphysema, stable by apical scarring, stable pulmonary nodules largest measuring 3 mm unchanged from 2021).  Old rib fractures and compression fracture   11/10/2023 Follow up : COPD, O2 RF , OSA Patient returns for a 21-month follow-up.  Patient is followed for severe COPD with emphysema and chronic respiratory failure on oxygen.  Patient remains on Breztri inhaler twice daily.  Says overall breathing has been doing very well.  He is very pleased with his progress.  He has been able to go play golf now once or twice a week.  That is made him very happy/he is also being able to travel more with his wife.  He does continue to go to the gym. Remains on prednisone 10 mg daily.  In the past we have tried to go on lower doses with recurrent exacerbations.  Patient is followed for sleep apnea on CPAP with oxygen 2l/m . CPAP download shows excellent compliance with daily average usage at 9 hours.  On CPAP 9 cm H2O.  AHI 3.0/hour.  Feels benefits from CPAP.  Patient has diastolic heart failure and A-fib followed by cardiology.  Says overall edema has been stable.  Remains on Lasix 40 mg daily.  The patient is also using Breztri twice a day, a flutter valve, and oxygen as needed. They use the oxygen when moving around and it goes into their CPAP at night. They also take it with them on the golf course. The patient reports consistent use of their CPAP machine every night, with oxygen at two liters.   the  patient is also on Lasix for cardiology follow-up. They report no recent swelling in their ankles and are maintaining a single dose of Lasix. They have been adhering to aspiration precautions, including staying upright after eating and avoiding late-night meals. They deny any recent episodes of vomiting.  The patient had a fall in the past, resulting in some bruising, but reports no recent falls and an improvement in their balance. They had a CT scan in June,  which showed severe emphysema and small, unchanged nodules over the past three years. The patient quit smoking in 2010.       Allergies  Allergen Reactions   Tape Other (See Comments)    PLEASE USE COBAN WRAP IN LIEU OF "TAPE," as it "pulls off the skin!!   Daliresp [Roflumilast] Diarrhea and Nausea And Vomiting   Levaquin [Levofloxacin] Palpitations and Other (See Comments)    Made the B/P fluctuate and heart raced   Alendronate Anxiety and Other (See Comments)    Jittery/nervous    Immunization History  Administered Date(s) Administered   Fluad Quad(high Dose 65+) 10/02/2020, 10/09/2021, 09/05/2022, 10/17/2022   Fluad Trivalent(High Dose 65+) 10/30/2023   Influenza Split 09/22/2013, 09/01/2015   Influenza Whole 09/04/2011, 09/21/2012   Influenza, High Dose Seasonal PF 09/02/2016, 09/10/2017, 08/31/2018, 09/24/2018, 10/21/2019   Influenza-Unspecified 08/23/2014   Moderna Covid-19 Vaccine Bivalent Booster 33yrs & up 10/09/2021   Moderna SARS-COV2 Booster Vaccination 07/05/2021   Moderna Sars-Covid-2 Vaccination 01/17/2020, 02/14/2020, 10/06/2020   PNEUMOCOCCAL CONJUGATE-20 12/05/2022   Pfizer(Comirnaty)Fall Seasonal Vaccine 12 years and older 10/31/2022, 10/30/2023   Pneumococcal Conjugate-13 01/11/2014, 12/09/2016   Pneumococcal Polysaccharide-23 08/23/2010, 08/29/2011   Respiratory Syncytial Virus Vaccine,Recomb Aduvanted(Arexvy) 09/23/2022   Tdap 02/01/2019   Zoster Recombinant(Shingrix) 04/04/2014    Past Medical History:  Diagnosis Date   Anemia    Aortic atherosclerosis (HCC)    Aspiration pneumonia due to inhalation of vomitus (HCC) 02/03/2022   Atrial fibrillation (HCC)    Back pain    COPD (chronic obstructive pulmonary disease) (HCC)    COPD exacerbation (HCC) 12/27/2021   COVID-19 12/24/2020   Diabetes mellitus without complication (HCC)    Dizziness 04/08/2022   Elevated PSA    GAD (generalized anxiety disorder)    GERD (gastroesophageal reflux disease)     High cholesterol    Hypertension    Lingular pneumonia 01/05/2021   See cxr 01/06/20 rx with zpak/ cefipime x 5 days and then developed purulent sputum again 01/12/21 so rx levcaquin 500 mg daily x 7 days then return for cxr before more    Lung nodule < 6cm on CT 05/29/2016   Osteoporosis    Oxygen deficiency    Pneumonia    January 2022   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 Lalla Brothers with a 20 mm Watchman FLX device   RSV bronchitis 01/27/2022   Vertebral compression fracture (HCC) 05/29/2019   T8 compression fracture noted on CT scan 05/28/2019 inpatient with chronic steroid dependent COPD - rx calcitonin nasal spray rx per PCP     Tobacco History: Social History   Tobacco Use  Smoking Status Former   Current packs/day: 0.00   Average packs/day: 2.0 packs/day for 52.0 years (104.0 ttl pk-yrs)   Types: Cigarettes   Start date: 02/03/1957   Quit date: 02/03/2009   Years since quitting: 14.7  Smokeless Tobacco Never  Tobacco Comments   Counseled to remain smoke free   Counseling given: Not Answered Tobacco comments: Counseled  to remain smoke free   Outpatient Medications Prior to Visit  Medication Sig Dispense Refill   Accu-Chek Softclix Lancets lancets Check fasting sugar once daily. 100 each 3   acetaminophen (TYLENOL) 500 MG tablet Take 1,000 mg by mouth at bedtime as needed (pain).     albuterol (PROAIR HFA) 108 (90 Base) MCG/ACT inhaler Inhale 2 puffs into the lungs as needed for wheezing or shortness of breath.     albuterol (PROVENTIL) (2.5 MG/3ML) 0.083% nebulizer solution Take 3 mLs (2.5 mg total) by nebulization every 6 (six) hours as needed for shortness of breath or wheezing. 360 mL 5   ALPRAZolam (XANAX) 0.5 MG tablet Take 1 tablet (0.5 mg total) by mouth 2 (two) times daily as needed for anxiety. 30 tablet 0   Ascorbic Acid (VITAMIN C) 1000 MG tablet Take 1,000 mg by mouth daily.     aspirin EC 81 MG tablet Take 1  tablet (81 mg total) by mouth daily. Swallow whole. 90 tablet 3   atorvastatin (LIPITOR) 40 MG tablet TAKE 1 TABLET BY MOUTH EVERYDAY AT BEDTIME 90 tablet 1   blood glucose meter kit and supplies KIT Dispense based on patient and insurance preference. Use up to four times daily as directed. 1 each 0   Blood Glucose Monitoring Suppl DEVI Use to test blood sugar up to twice daily. May substitute to any manufacturer covered by patient's insurance. E11.0 1 each 0   Budeson-Glycopyrrol-Formoterol (BREZTRI AEROSPHERE) 160-9-4.8 MCG/ACT AERO Inhale 2 puffs into the lungs 2 (two) times daily. 3 each 4   calcium carbonate (OS-CAL) 600 MG TABS tablet Take 600 mg by mouth daily.     Cholecalciferol (VITAMIN D3) 50 MCG (2000 UT) TABS Take 2,000 Units by mouth in the morning.     dapagliflozin propanediol (FARXIGA) 10 MG TABS tablet Take 1 tablet (10 mg total) by mouth daily. 90 tablet 4   denosumab (PROLIA) 60 MG/ML SOSY injection Inject 60 mg into the skin every 6 (six) months. 180 mL 0   digoxin (LANOXIN) 0.125 MG tablet Take 1 tablet (0.125 mg total) by mouth daily. 90 tablet 3   ferrous sulfate 325 (65 FE) MG tablet Take 325 mg by mouth 2 (two) times daily with a meal.     fluticasone (FLONASE) 50 MCG/ACT nasal spray Place 2 sprays into both nostrils daily as needed for allergies or rhinitis.     furosemide (LASIX) 40 MG tablet Take 1 tablet by mouth daily, may take 1 extra tablet daily only as needed for weight gain or lower extremity swelling     Glucose Blood (BLOOD GLUCOSE TEST STRIPS) STRP Use to test blood sugar up to twice daily. May substitute to any manufacturer covered by patient's insurance. E11.0 100 strip 3   Lancet Device MISC Use to test blood sugar up to twice daily. May substitute to any manufacturer covered by patient's insurance. E11.0 1 each 0   Lancets Misc. MISC Use to test blood sugar up to twice daily. May substitute to any manufacturer covered by patient's insurance. E11.0 100 each 3    loratadine (CLARITIN) 10 MG tablet Take 10 mg by mouth in the morning.     Melatonin 12 MG TABS Take 12 mg by mouth at bedtime.     Multiple Vitamin (MULTIVITAMIN WITH MINERALS) TABS tablet Take 1 tablet by mouth daily with breakfast.     OXYGEN Inhale 2 L/min into the lungs at bedtime.     pantoprazole (PROTONIX) 40 MG tablet Take  1 tablet (40 mg total) by mouth 2 (two) times daily. 180 tablet 3   predniSONE (DELTASONE) 10 MG tablet Take 1 tablet (10 mg total) by mouth daily with breakfast. 90 tablet 1   Probiotic Product (PROBIOTIC DAILY) CAPS Take 1 capsule by mouth in the morning.     sertraline (ZOLOFT) 50 MG tablet TAKE 1 TABLET BY MOUTH EVERYDAY AT BEDTIME 90 tablet 1   No facility-administered medications prior to visit.     Review of Systems:   Constitutional:   No  weight loss, night sweats,  Fevers, chills, +fatigue, or  lassitude.  HEENT:   No headaches,  Difficulty swallowing,  Tooth/dental problems, or  Sore throat,                No sneezing, itching, ear ache, nasal congestion, post nasal drip,   CV:  No chest pain,  Orthopnea, PND, swelling in lower extremities, anasarca, dizziness, palpitations, syncope.   GI  No heartburn, indigestion, abdominal pain, nausea, vomiting, diarrhea, change in bowel habits, loss of appetite, bloody stools.   Resp: .  No chest wall deformity  Skin: no rash or lesions.  GU: no dysuria, change in color of urine, no urgency or frequency.  No flank pain, no hematuria   MS:  No joint pain or swelling.  No decreased range of motion.  No back pain.    Physical Exam  BP (!) 150/60 (BP Location: Left Arm, Cuff Size: Normal)   Pulse 85   Ht 5\' 5"  (1.651 m)   Wt 160 lb (72.6 kg)   SpO2 95%   BMI 26.63 kg/m   GEN: A/Ox3; pleasant , NAD, well nourished    HEENT:  [place]/AT,  EACs-clear, TMs-wnl, NOSE-clear, THROAT-clear, no lesions, no postnasal drip or exudate noted.   NECK:  Supple w/ fair ROM; no JVD; normal carotid impulses w/o  bruits; no thyromegaly or nodules palpated; no lymphadenopathy.    RESP  Clear  P & A; w/o, wheezes/ rales/ or rhonchi. no accessory muscle use, no dullness to percussion  CARD:  RRR, no m/r/g, no peripheral edema, pulses intact, no cyanosis or clubbing.  GI:   Soft & nt; nml bowel sounds; no organomegaly or masses detected.   Musco: Warm bil, no deformities or joint swelling noted.   Neuro: alert, no focal deficits noted.    Skin: Warm, no lesions or rashes    Lab Results:    BNP   ProBNP No results found for: "PROBNP"  Imaging: No results found.  denosumab (PROLIA) injection 60 mg     Date Action Dose Route User   10/30/2023 1115 Given 60 mg Subcutaneous (Left Arm) Bramblett, Katherina A, CMA          Latest Ref Rng & Units 05/29/2016    9:15 AM  PFT Results  FVC-Pre L 1.92   FVC-Predicted Pre % 52   Pre FEV1/FVC % % 41   FEV1-Pre L 0.78   FEV1-Predicted Pre % 29     No results found for: "NITRICOXIDE"      Assessment & Plan:   Assessment and Plan    Chronic Obstructive Pulmonary Disease (COPD) well-managed COPD with a good functional status, engaging in an active lifestyle that includes golf, gym, and travel. continue on Breztri, prednisone 10 mg daily, and use a flutter valve, with oxygen therapy as needed and CPAP at night. A recent CT showed severe emphysema with stable small nodules. We discussed the potential for prednisone tapering in  spring if stable, weighing the risks of long-term prednisone use, such as adrenal insufficiency and weight gain, against its benefits in improving respiratory function. We will continue Breztri twice daily, prednisone 10 mg daily, and the regular use of a flutter valve. Oxygen therapy as needed and with CPAP at night will also continue. Spirometry will be repeated in three months.  Obstructive Sleep Apnea Their obstructive sleep apnea is well-controlled with CPAP therapy, using CPAP nightly with oxygen at 2 liters.  We  will continue CPAP therapy with oxygen at 2 liters nightly.  Congestive Heart Failure Their congestive heart failure is well-managed on current medications, with no recent episodes of edema or dizziness. .  continue on Lasix and Farxiga,  follow up with cardiology as scheduled.  General Health Maintenance They are up to date on vaccinations, including flu, COVID-19, and pneumonia, and RSV . We discussed the importance of hand hygiene and avoiding crowded places during virus season. They will continue the current vaccination schedule, practice good hand hygiene, and avoid crowded places during virus season.  Follow-up A follow-up appointment is scheduled in three months, with spirometry to be performed at the next visit.        Rubye Oaks, NP 11/10/2023

## 2023-11-14 DIAGNOSIS — Z961 Presence of intraocular lens: Secondary | ICD-10-CM | POA: Diagnosis not present

## 2023-11-14 DIAGNOSIS — H35013 Changes in retinal vascular appearance, bilateral: Secondary | ICD-10-CM | POA: Diagnosis not present

## 2023-11-14 DIAGNOSIS — H43813 Vitreous degeneration, bilateral: Secondary | ICD-10-CM | POA: Diagnosis not present

## 2023-11-14 DIAGNOSIS — H524 Presbyopia: Secondary | ICD-10-CM | POA: Diagnosis not present

## 2023-11-14 DIAGNOSIS — H26491 Other secondary cataract, right eye: Secondary | ICD-10-CM | POA: Diagnosis not present

## 2023-11-14 DIAGNOSIS — D3132 Benign neoplasm of left choroid: Secondary | ICD-10-CM | POA: Diagnosis not present

## 2023-12-29 ENCOUNTER — Other Ambulatory Visit: Payer: Self-pay

## 2023-12-29 DIAGNOSIS — E1169 Type 2 diabetes mellitus with other specified complication: Secondary | ICD-10-CM

## 2023-12-29 MED ORDER — DAPAGLIFLOZIN PROPANEDIOL 10 MG PO TABS: 10.0000 mg | ORAL_TABLET | Freq: Every day | ORAL | 4 refills | Status: DC

## 2023-12-29 MED ORDER — BREZTRI AEROSPHERE 160-9-4.8 MCG/ACT IN AERO: 2.0000 | INHALATION_SPRAY | Freq: Two times a day (BID) | RESPIRATORY_TRACT | 4 refills | Status: DC

## 2023-12-30 ENCOUNTER — Ambulatory Visit: Payer: Medicare Other | Attending: Cardiology | Admitting: Cardiology

## 2023-12-30 ENCOUNTER — Encounter: Payer: Self-pay | Admitting: Cardiology

## 2023-12-30 VITALS — BP 134/62 | HR 80 | Ht 65.0 in | Wt 162.4 lb

## 2023-12-30 DIAGNOSIS — I2510 Atherosclerotic heart disease of native coronary artery without angina pectoris: Secondary | ICD-10-CM

## 2023-12-30 DIAGNOSIS — Z95818 Presence of other cardiac implants and grafts: Secondary | ICD-10-CM

## 2023-12-30 DIAGNOSIS — R296 Repeated falls: Secondary | ICD-10-CM | POA: Diagnosis not present

## 2023-12-30 DIAGNOSIS — I5032 Chronic diastolic (congestive) heart failure: Secondary | ICD-10-CM | POA: Diagnosis not present

## 2023-12-30 DIAGNOSIS — I951 Orthostatic hypotension: Secondary | ICD-10-CM

## 2023-12-30 DIAGNOSIS — I480 Paroxysmal atrial fibrillation: Secondary | ICD-10-CM | POA: Diagnosis not present

## 2023-12-30 NOTE — Progress Notes (Signed)
 Cardiology Office Note:  .   Date:  12/30/2023  ID:  Paul [name], DOB [birth year], MRN [id number] PCP: [name], MD  [facility] Providers Cardiologist:  Oneil Parchment, MD Electrophysiologist:  OLE ONEIDA HOLTS, MD     History of Present Illness: .   Paul [name] is a 79 y.o. male Discussed with the use of AI scribe History of Present Illness   The patient is a 78 year old male with a history of paroxysmal atrial fibrillation, coronary artery disease, chronic diastolic heart failure, COPD, and orthostatic hypotension.    The patient underwent left atrial appendage occlusion with a Watchman device and subsequent Xio monitor implantation in 2023, which showed rare PACs and PVCs but no atrial fibrillation.    The patient also has coronary artery calcification with a calcium  score of 1049 in 2022, but reports no chest pain or tightness. He is on aspirin  and atorvastatin  for prevention.  The patient's chronic diastolic heart failure is managed with Lasix  and Farxiga , but goal-directed medical therapy is limited due to orthostatic hypotension. The patient's dizziness improved after stopping carvedilol .   The patient has a CHADS-VASc score of 6 with atrial fibrillation and has had multiple ER visits in 2024 for mild COPD exacerbations and falls.  The patient was intolerant to anticoagulation due to gross hematuria. The patient reports feeling the best he has in a long time and has resumed playing golf. The patient's heart rate occasionally gets high, but this does not cause him any distress. The patient's creatinine was 0.9, indicating good kidney function, and his hemoglobin was 11.9. The patient's LDL was 88, close to the target of less than 70. The patient is adhering to a Mediterranean diet.           Studies Reviewed: .        Results   LABS LDL: 88 Creatinine: 0.9 Hemoglobin: 11.9  RADIOLOGY Calcium  score: 1049 (06/2021)  DIAGNOSTIC Echocardiogram: EF 65-70%,  grade 1 diastolic dysfunction (01/2023)     Risk Assessment/Calculations:    CHA2DS2-VASc Score = 6   This indicates a 9.7% annual risk of stroke. The patient's score is based upon: CHF History: 1 HTN History: 1 Diabetes History: 1 Stroke History: 0 Vascular Disease History: 1 Age Score: 2 Gender Score: 0            Physical Exam:   VS:  BP 134/62   Pulse 80   Ht 5' 5 (1.651 m)   Wt 162 lb 6.4 oz (73.7 kg)   SpO2 93%   BMI 27.02 kg/m    Wt Readings from Last 3 Encounters:  12/30/23 162 lb 6.4 oz (73.7 kg)  11/10/23 160 lb (72.6 kg)  10/30/23 159 lb (72.1 kg)    GEN: Well nourished, well developed in no acute distress NECK: No JVD; No carotid bruits CARDIAC: RRR, no murmurs, no rubs, no gallops RESPIRATORY:  Clear to auscultation without rales, wheezing or rhonchi, distant breath sounds ABDOMEN: Soft, non-tender, non-distended EXTREMITIES:  No edema; No deformity   ASSESSMENT AND PLAN: .    Assessment and Plan    Paroxysmal Atrial Fibrillation Diagnosed in 2020. Status post left atrial appendage occlusion with Watchman device in August 2022. Xio monitor in May 2023 showed rare PACs and PVCs, no atrial fibrillation. CHADS-VASc score 6. Intolerant to anticoagulation due to gross hematuria. On digoxin  for rate control. Tikosyn (dofetilide) discussed but opted for conservative approach. - Continue digoxin  0.125 mg - Follow up in 6 months with  cardiology  Coronary Artery Disease Coronary artery calcification with calcium  score of 1049 on CT in July 2022. No current chest pain. On aspirin  and atorvastatin . Last LDL 88, target <70. - Continue aspirin  81 mg daily - Continue atorvastatin  40 mg daily - Encourage Mediterranean diet  Chronic Diastolic Heart Failure Echocardiogram in February 2024 showed EF 65-70% with grade 1 diastolic dysfunction. Currently euvolemic. On Lasix  and Farxiga . Goal-directed medical therapy limited due to orthostatic hypotension. - Continue  Lasix  - Continue Farxiga   Orthostatic Hypotension Frequent falls. Blood pressure 134/62. Dizziness improved since stopping carvedilol . Encouraged slow positional changes. - Remain off carvedilol  - Encourage slow positional changes  General Health Maintenance None - Encourage Mediterranean diet - Avoid high-energy foods like pasta, potatoes, and bread - Limit intake of French fries  Follow-up - Follow up in 6 months with cardiology, specifically with Delon Hoover in [place].               Signed, Oneil Parchment, MD

## 2023-12-30 NOTE — Patient Instructions (Signed)
 Medication Instructions:  Your physician recommends that you continue on your current medications as directed. Please refer to the Current Medication list given to you today.  *If you need a refill on your cardiac medications before your next appointment, please call your pharmacy*   Lab Work: NONE  If you have labs (blood work) drawn today and your tests are completely normal, you will receive your results only by: MyChart Message (if you have MyChart) OR A paper copy in the mail If you have any lab test that is abnormal or we need to change your treatment, we will call you to review the results.   Testing/Procedures: NONE   Follow-Up: At [facility], you and your health needs are our priority.  As part of our continuing mission to provide you with exceptional heart care, we have created designated Provider Care Teams.  These Care Teams include your primary Cardiologist (physician) and Advanced Practice Providers (APPs -  Physician Assistants and Nurse Practitioners) who all work together to provide you with the care you need, when you need it.   Your next appointment:   6 month(s)  Provider:   Delon Hoover, NP [place])

## 2024-01-08 ENCOUNTER — Telehealth: Payer: Self-pay | Admitting: Adult Health

## 2024-01-08 NOTE — Telephone Encounter (Signed)
[name] wife states patient needs renewal for oxygen. Patient uses [employer] [facility]. States needs to be done today. [employer] [facility] phone number is [phone]. [name] phone number is [phone].

## 2024-01-08 NOTE — Telephone Encounter (Signed)
I called and spoke with Paul [name] at [employer]  He says that there is a CMN needed for pt to continue o2  He is going to reach out to [place] and her email to me or fax  Will hold until recieved

## 2024-01-09 NOTE — Telephone Encounter (Signed)
Have you received from brad ?

## 2024-01-12 ENCOUNTER — Telehealth: Payer: Self-pay | Admitting: Family Medicine

## 2024-01-12 NOTE — Telephone Encounter (Signed)
I have not received anything  Routing to [facility] to see if the CMN came to them  Thanks

## 2024-01-12 NOTE — Telephone Encounter (Signed)
[facility] PROVIDER NOTE

## 2024-01-12 NOTE — Telephone Encounter (Signed)
[facility] S to Me  CC   01/12/24  9:25 AM  Delaine Lame from [employer] stated that the pt is coming up on his 5 year mark so in order for insurance to continue to pay they need to be treated as a new start. So he needs a new order for 02, new sats, OV notes to be within the last 90 days stating his continued need for 02. Please advise  Pt has upcoming visit 02/16/24 and I have added to his appt notes that he needs to be re certified for o2

## 2024-01-13 ENCOUNTER — Other Ambulatory Visit: Payer: Self-pay | Admitting: Internal Medicine

## 2024-01-27 ENCOUNTER — Other Ambulatory Visit: Payer: Self-pay | Admitting: Family Medicine

## 2024-02-13 ENCOUNTER — Other Ambulatory Visit: Payer: Self-pay | Admitting: Adult Health

## 2024-02-13 DIAGNOSIS — J449 Chronic obstructive pulmonary disease, unspecified: Secondary | ICD-10-CM

## 2024-02-16 ENCOUNTER — Encounter: Payer: Self-pay | Admitting: Adult Health

## 2024-02-16 ENCOUNTER — Ambulatory Visit: Payer: Medicare Other | Admitting: Internal Medicine

## 2024-02-16 ENCOUNTER — Ambulatory Visit (INDEPENDENT_AMBULATORY_CARE_PROVIDER_SITE_OTHER): Payer: Medicare Other | Admitting: Adult Health

## 2024-02-16 VITALS — BP 138/52 | HR 87 | Ht 65.0 in | Wt 160.0 lb

## 2024-02-16 DIAGNOSIS — R918 Other nonspecific abnormal finding of lung field: Secondary | ICD-10-CM

## 2024-02-16 DIAGNOSIS — J449 Chronic obstructive pulmonary disease, unspecified: Secondary | ICD-10-CM

## 2024-02-16 DIAGNOSIS — I5032 Chronic diastolic (congestive) heart failure: Secondary | ICD-10-CM

## 2024-02-16 DIAGNOSIS — J9611 Chronic respiratory failure with hypoxia: Secondary | ICD-10-CM

## 2024-02-16 LAB — PULMONARY FUNCTION TEST
DL/VA % pred: 62 %
DL/VA: 2.48 ml/min/mmHg/L
DLCO cor % pred: 41 %
DLCO cor: 8.82 ml/min/mmHg
DLCO unc % pred: 41 %
DLCO unc: 8.82 ml/min/mmHg
FEF 25-75 Post: 0.26 L/s
FEF 25-75 Pre: 0.29 L/s
FEF2575-%Change-Post: -10 %
FEF2575-%Pred-Post: 15 %
FEF2575-%Pred-Pre: 17 %
FEV1-%Change-Post: -7 %
FEV1-%Pred-Post: 31 %
FEV1-%Pred-Pre: 33 %
FEV1-Post: 0.74 L
FEV1-Pre: 0.8 L
FEV1FVC-%Change-Post: -1 %
FEV1FVC-%Pred-Pre: 68 %
FEV6-%Change-Post: -4 %
FEV6-%Pred-Post: 49 %
FEV6-%Pred-Pre: 51 %
FEV6-Post: 1.54 L
FEV6-Pre: 1.62 L
FEV6FVC-%Change-Post: 0 %
FEV6FVC-%Pred-Post: 107 %
FEV6FVC-%Pred-Pre: 107 %
FVC-%Change-Post: -5 %
FVC-%Pred-Post: 45 %
FVC-%Pred-Pre: 48 %
FVC-Post: 1.54 L
FVC-Pre: 1.64 L
Post FEV1/FVC ratio: 48 %
Post FEV6/FVC ratio: 100 %
Pre FEV1/FVC ratio: 49 %
Pre FEV6/FVC Ratio: 99 %
RV % pred: 181 %
RV: 4.29 L
TLC % pred: 108 %
TLC: 6.68 L

## 2024-02-16 NOTE — Assessment & Plan Note (Signed)
 Appears compensated .

## 2024-02-16 NOTE — Progress Notes (Signed)
 @Patient  ID: Paul [name], male    DOB: [birth year], 78 y.o.   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  March 2023 outpatient ABG without hypercarbia unable to qualify for nocturnal BiPAP or NIV  TEST/EVENTS :  05/29/16: FVC 2.08 L (86%) FEV1 0.83 L (31%) FEV1/FVC 0.40 FEF 25-75 0.30 L (14%) no bronchodilator response TLC 6.56 L (107%) RV 167% ERV 160% DLCO corrected 60% (hemoglobin 10.9) 07/15/13: FVC 2.68 L (60%) FEV1 1.63 L (53%) FEV1/FVC 0.61 FEF 25-75 0.76 L (26%) 02/04/12: FVC 2.18 L (57%) FEV1 1.09 L (36%) FEV1/FVC 0.50 FEF 25-75 0.33 L (11%)     CT sinus February 2021 no acute sinus disease noted CT chest February 2021 emphysematous changes, several old thoracic compression fractures   2D echo July 2020 EF 60-65%, 2D echo September 13, 2021 EF 60-65%, Watchman device,, grade 3 moderate layered plaque involving the descending aorta   Palliative care started 2021 (nurse comes every 2 weeks)   NPSG March 11, 2023 showed Mild obstructive sleep apnea with AHI at 13.9 and SpO2 low at 93%.  Required 2 L of oxygen with CPAP at 9 cm H2O.    CT chest May 29, 2023 showed severe emphysema, stable by apical scarring, stable  pulmonary nodules largest measuring 3 mm unchanged from 2021).  Old rib fractures and compression fracture  02/16/2024 Follow up : COPD , O2 RF , OSA Patient presents for a 25-month follow-up.  Patient is followed for severe COPD with emphysema and chronic respiratory failure on oxygen.  Says overall breathing is doing okay.  He remains on Breztri inhaler twice daily. Last hospital stay March 2024 for COPD exacerbation and CHF decompensation.  PFTs done today show stable lung function compared to 2017 with FEV1 at 33%, ratio 49, FVC 48%, diffusing capacity 41%. Some decline in DLCO . We discussed in detail.  Remains on prednisone 10 mg daily.  In the past we have tried to lower his prednisone doses but unfortunately had frequent exacerbations.  Remains active, playing golf when weather allows. Going to gym on regular basis.  Not using O2 much during daytime. Takes POC when he goes to [employer].  "The more I do the better I feel"  Patient does have obstructive sleep apnea on CPAP with oxygen at 2 L.  CPAP download shows excellent compliance with 100% usage, 9hr daily usage. AHI 1.8/hr  On CPAP 9cmH2o. Using full face mask. Feels he benefits from CPAP.   Has a history of diastolic heart failure and A-fib followed by cardiology.  Denies any increased swelling.  Under some stress, son has brain cancer undergoing chemo. Family that are missionaries in [place] is coming home next month to visit.   Allergies  Allergen Reactions   Tape Other (See Comments)    PLEASE USE COBAN WRAP IN  LIEU OF "TAPE," as it "pulls off the skin!!   Daliresp [Roflumilast] Diarrhea and Nausea And Vomiting   Levaquin [Levofloxacin] Palpitations and Other (See Comments)    Made the B/P fluctuate and heart raced   Alendronate Anxiety and Other (See Comments)    Jittery/nervous    Immunization History  Administered Date(s) Administered   Fluad Quad(high Dose 65+) 10/02/2020, 10/09/2021, 09/05/2022, 10/17/2022   Fluad  Trivalent(High Dose 65+) 10/30/2023   Influenza Split 09/22/2013, 09/01/2015   Influenza Whole 09/04/2011, 09/21/2012   Influenza, High Dose Seasonal PF 09/02/2016, 09/10/2017, 08/31/2018, 09/24/2018, 10/21/2019   Influenza-Unspecified 08/23/2014   Moderna Covid-19 Vaccine Bivalent Booster 46yrs & up 10/09/2021   Moderna SARS-COV2 Booster Vaccination 07/05/2021   Moderna Sars-Covid-2 Vaccination 01/17/2020, 02/14/2020, 10/06/2020   PNEUMOCOCCAL CONJUGATE-20 12/05/2022   Pfizer(Comirnaty)Fall Seasonal Vaccine 12 years and older 10/31/2022, 10/30/2023   Pneumococcal Conjugate-13 01/11/2014, 12/09/2016   Pneumococcal Polysaccharide-23 08/23/2010, 08/29/2011   Respiratory Syncytial Virus Vaccine,Recomb Aduvanted(Arexvy) 09/23/2022   Tdap 02/01/2019   Zoster Recombinant(Shingrix) 04/04/2014    Past Medical History:  Diagnosis Date   Anemia    Aortic atherosclerosis (HCC)    Aspiration pneumonia due to inhalation of vomitus (HCC) 02/03/2022   Atrial fibrillation (HCC)    Back pain    COPD (chronic obstructive pulmonary disease) (HCC)    COPD exacerbation (HCC) 12/27/2021   COVID-19 12/24/2020   Diabetes mellitus without complication (HCC)    Dizziness 04/08/2022   Elevated PSA    GAD (generalized anxiety disorder)    GERD (gastroesophageal reflux disease)    High cholesterol    Hypertension    Lingular pneumonia 01/05/2021   See cxr 01/06/20 rx with zpak/ cefipime x 5 days and then developed purulent sputum again 01/12/21 so rx levcaquin 500 mg daily x 7 days then return for cxr before more    Lung nodule < 6cm on CT 05/29/2016   Osteoporosis    Oxygen deficiency    Pneumonia    January 2022   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 Lalla Brothers with a 20 mm Watchman FLX device   RSV bronchitis 01/27/2022   Vertebral compression fracture (HCC) 05/29/2019   T8 compression fracture noted on CT scan 05/28/2019 inpatient with chronic steroid  dependent COPD - rx calcitonin nasal spray rx per PCP     Tobacco History: Social History   Tobacco Use  Smoking Status Former   Current packs/day: 0.00   Average packs/day: 2.0 packs/day for 52.0 years (104.0 ttl pk-yrs)   Types: Cigarettes   Start date: 02/03/1957   Quit date: 02/03/2009   Years since quitting: 15.0  Smokeless Tobacco Never  Tobacco Comments   Counseled to remain smoke free   Counseling given: Not Answered Tobacco comments: Counseled to remain smoke free   Outpatient Medications Prior to Visit  Medication Sig Dispense Refill   Accu-Chek Softclix Lancets lancets Check fasting sugar once daily. 100 each 3   acetaminophen (TYLENOL) 500 MG tablet Take 1,000 mg by mouth at bedtime as needed (pain).     albuterol (PROAIR HFA) 108 (90 Base) MCG/ACT inhaler Inhale 2 puffs into the lungs as needed for wheezing or shortness of breath.     albuterol (PROVENTIL) (2.5 MG/3ML) 0.083% nebulizer solution Take 3 mLs (2.5 mg total) by nebulization every 6 (six) hours as needed for shortness of breath or wheezing. 360 mL 5   ALPRAZolam (XANAX) 0.5 MG tablet Take 1 tablet (0.5 mg total) by  mouth 2 (two) times daily as needed for anxiety. 30 tablet 0   amLODipine (NORVASC) 2.5 MG tablet Take 2.5 mg by mouth daily.     Ascorbic Acid (VITAMIN C) 1000 MG tablet Take 1,000 mg by mouth daily.     aspirin EC 81 MG tablet Take 1 tablet (81 mg total) by mouth daily. Swallow whole. 90 tablet 3   atorvastatin (LIPITOR) 40 MG tablet TAKE 1 TABLET BY MOUTH EVERYDAY AT BEDTIME 90 tablet 1   blood glucose meter kit and supplies KIT Dispense based on patient and insurance preference. Use up to four times daily as directed. 1 each 0   Blood Glucose Monitoring Suppl DEVI Use to test blood sugar up to twice daily. May substitute to any manufacturer covered by patient's insurance. E11.0 1 each 0   Budeson-Glycopyrrol-Formoterol (BREZTRI AEROSPHERE) 160-9-4.8 MCG/ACT AERO Inhale 2 puffs into the lungs 2  (two) times daily. 3 each 4   calcium carbonate (OS-CAL) 600 MG TABS tablet Take 600 mg by mouth daily.     Cholecalciferol (VITAMIN D3) 50 MCG (2000 UT) TABS Take 2,000 Units by mouth in the morning.     dapagliflozin propanediol (FARXIGA) 10 MG TABS tablet Take 1 tablet (10 mg total) by mouth daily. 90 tablet 4   denosumab (PROLIA) 60 MG/ML SOSY injection Inject 60 mg into the skin every 6 (six) months. 180 mL 0   digoxin (LANOXIN) 0.125 MG tablet Take 1 tablet (0.125 mg total) by mouth daily. 90 tablet 3   ferrous sulfate 325 (65 FE) MG tablet Take 325 mg by mouth 2 (two) times daily with a meal.     fluticasone (FLONASE) 50 MCG/ACT nasal spray Place 2 sprays into both nostrils daily as needed for allergies or rhinitis.     furosemide (LASIX) 40 MG tablet Take 1 tablet by mouth daily, may take 1 extra tablet daily only as needed for weight gain or lower extremity swelling     Glucose Blood (BLOOD GLUCOSE TEST STRIPS) STRP Use to test blood sugar up to twice daily. May substitute to any manufacturer covered by patient's insurance. E11.0 100 strip 3   Lancet Device MISC Use to test blood sugar up to twice daily. May substitute to any manufacturer covered by patient's insurance. E11.0 1 each 0   Lancets Misc. MISC Use to test blood sugar up to twice daily. May substitute to any manufacturer covered by patient's insurance. E11.0 100 each 3   loratadine (CLARITIN) 10 MG tablet Take 10 mg by mouth in the morning.     Melatonin 12 MG TABS Take 12 mg by mouth at bedtime.     Multiple Vitamin (MULTIVITAMIN WITH MINERALS) TABS tablet Take 1 tablet by mouth daily with breakfast.     OXYGEN Inhale 2 L/min into the lungs at bedtime.     pantoprazole (PROTONIX) 40 MG tablet TAKE 1 TABLET BY MOUTH TWICE A DAY 180 tablet 3   predniSONE (DELTASONE) 10 MG tablet Take 1 tablet (10 mg total) by mouth daily with breakfast. 90 tablet 1   Probiotic Product (PROBIOTIC DAILY) CAPS Take 1 capsule by mouth in the morning.      sertraline (ZOLOFT) 50 MG tablet TAKE 1 TABLET BY MOUTH EVERYDAY AT BEDTIME 90 tablet 1   No facility-administered medications prior to visit.     Review of Systems:   Constitutional:   No  weight loss, night sweats,  Fevers, chills,  +fatigue, or  lassitude.  HEENT:   No headaches,  Difficulty swallowing,  Tooth/dental problems, or  Sore throat,                No sneezing, itching, ear ache, nasal congestion, post nasal drip,   CV:  No chest pain,  Orthopnea, PND, swelling in lower extremities, anasarca, dizziness, palpitations, syncope.   GI  No heartburn, indigestion, abdominal pain, nausea, vomiting, diarrhea, change in bowel habits, loss of appetite, bloody stools.   Resp: .  No chest wall deformity  Skin: no rash or lesions.  GU: no dysuria, change in color of urine, no urgency or frequency.  No flank pain, no hematuria   MS:  No joint pain or swelling.  No decreased range of motion.  No back pain.    Physical Exam  BP (!) 138/52   Pulse 87   Ht 5\' 5"  (1.651 m)   Wt 160 lb (72.6 kg)   SpO2 95%   BMI 26.63 kg/m   GEN: A/Ox3; pleasant , NAD, well nourished    HEENT:  [place]/AT,  NOSE-clear, THROAT-clear, no lesions, no postnasal drip or exudate noted.   NECK:  Supple w/ fair ROM; no JVD; normal carotid impulses w/o bruits; no thyromegaly or nodules palpated; no lymphadenopathy.    RESP  Clear  P & A; w/o, wheezes/ rales/ or rhonchi. no accessory muscle use, no dullness to percussion  CARD:  RRR, no m/r/g, no peripheral edema, pulses intact, no cyanosis or clubbing.  GI:   Soft & nt; nml bowel sounds; no organomegaly or masses detected.   Musco: Warm bil, no deformities or joint swelling noted.   Neuro: alert, no focal deficits noted.    Skin: Warm, no lesions or rashes    Lab Results:  CBC     ProBNP No results found for: "PROBNP"  Imaging: No results found.  Administration History     None          Latest Ref Rng & Units 02/16/2024     8:56 AM 05/29/2016    9:15 AM  PFT Results  FVC-Pre L 1.64  P 1.92   FVC-Predicted Pre % 48  P 52   FVC-Post L 1.54  P   FVC-Predicted Post % 45  P   Pre FEV1/FVC % % 49  P 41   Post FEV1/FCV % % 48  P   FEV1-Pre L 0.80  P 0.78   FEV1-Predicted Pre % 33  P 29   FEV1-Post L 0.74  P   DLCO uncorrected ml/min/mmHg 8.82  P   DLCO UNC% % 41  P   DLCO corrected ml/min/mmHg 8.82  P   DLCO COR %Predicted % 41  P   DLVA Predicted % 62  P   TLC L 6.68  P   TLC % Predicted % 108  P   RV % Predicted % 181  P     P Preliminary result    No results found for: "NITRICOXIDE"      Assessment & Plan:   COPD GOLD  III  Well controlled on current regimen -PFT essentially stable , slight drop in the DLCO with underlying severe Emphysema. O2 demands are stable. Improved activity tolerance . No hospitalizations x 1 year.  Continue on current regimen.  On return can look at slowly weaning dose of Prednisone -would go very slowly.   Plan  Patient Instructions  Continue on BREZTRI 2 puffs Twice daily, rinse after use.  Continue on Prednisone 10mg  daily Mucinex Twice daily  As  needed  Cough/congestion  Flutter valve Three times a day  .  Albuterol Neb or inhaler as needed  Wear Oxygen 2l/m  with activity and At bedtime   to maintain O2 sats >88-90%.  Activity as tolerated.  Aspirations precautions as discussed.  Daily weights Continue on Lasix as directed.  Continue on CPAP At bedtime with Oxygen 2l/m  and with naps.  Follow up with Dr. Sherene Sires  Or Kinlie Janice NP in 3 months.     Chronic respiratory failure with hypoxia (HCC) Continue on current regimen -no increased O2 demands   Plan  Patient Instructions  Continue on BREZTRI 2 puffs Twice daily, rinse after use.  Continue on Prednisone 10mg  daily Mucinex Twice daily  As needed  Cough/congestion  Flutter valve Three times a day  .  Albuterol Neb or inhaler as needed  Wear Oxygen 2l/m  with activity and At bedtime   to maintain O2 sats  >88-90%.  Activity as tolerated.  Aspirations precautions as discussed.  Daily weights Continue on Lasix as directed.  Continue on CPAP At bedtime with Oxygen 2l/m  and with naps.  Follow up with Dr. Sherene Sires  Or Damonta Cossey NP in 3 months.     Chronic diastolic CHF (congestive heart failure) (HCC) Appears compensated.   Pulmonary nodules Stable on CT chest 05/2023      Rubye Oaks, NP 02/16/2024

## 2024-02-16 NOTE — Progress Notes (Signed)
 Full PFT performed today.

## 2024-02-16 NOTE — Assessment & Plan Note (Signed)
 Continue on current regimen -no increased O2 demands   Plan  Patient Instructions  Continue on BREZTRI 2 puffs Twice daily, rinse after use.  Continue on Prednisone 10mg  daily Mucinex Twice daily  As needed  Cough/congestion  Flutter valve Three times a day  .  Albuterol Neb or inhaler as needed  Wear Oxygen 2l/m  with activity and At bedtime   to maintain O2 sats >88-90%.  Activity as tolerated.  Aspirations precautions as discussed.  Daily weights Continue on Lasix as directed.  Continue on CPAP At bedtime with Oxygen 2l/m  and with naps.  Follow up with Dr. Sherene Sires  Or Dior Stepter NP in 3 months.

## 2024-02-16 NOTE — Patient Instructions (Addendum)
 Continue on BREZTRI 2 puffs Twice daily, rinse after use.  Continue on Prednisone 10mg  daily Mucinex Twice daily  As needed  Cough/congestion  Flutter valve Three times a day  .  Albuterol Neb or inhaler as needed  Wear Oxygen 2l/m  with activity and At bedtime   to maintain O2 sats >88-90%.  Activity as tolerated.  Aspirations precautions as discussed.  Daily weights Continue on Lasix as directed.  Continue on CPAP At bedtime with Oxygen 2l/m  and with naps.  Follow up with Dr. Sherene Sires  Or Evelynn Hench NP in 3 months.

## 2024-02-16 NOTE — Assessment & Plan Note (Signed)
 Stable on CT chest 05/2023

## 2024-02-16 NOTE — Assessment & Plan Note (Signed)
 Well controlled on current regimen -PFT essentially stable , slight drop in the DLCO with underlying severe Emphysema. O2 demands are stable. Improved activity tolerance . No hospitalizations x 1 year.  Continue on current regimen.  On return can look at slowly weaning dose of Prednisone -would go very slowly.   Plan  Patient Instructions  Continue on BREZTRI 2 puffs Twice daily, rinse after use.  Continue on Prednisone 10mg  daily Mucinex Twice daily  As needed  Cough/congestion  Flutter valve Three times a day  .  Albuterol Neb or inhaler as needed  Wear Oxygen 2l/m  with activity and At bedtime   to maintain O2 sats >88-90%.  Activity as tolerated.  Aspirations precautions as discussed.  Daily weights Continue on Lasix as directed.  Continue on CPAP At bedtime with Oxygen 2l/m  and with naps.  Follow up with Dr. Sherene Sires  Or Jessey Stehlin NP in 3 months.

## 2024-02-16 NOTE — Patient Instructions (Signed)
 Full PFT performed today.

## 2024-02-25 DIAGNOSIS — C44219 Basal cell carcinoma of skin of left ear and external auricular canal: Secondary | ICD-10-CM | POA: Diagnosis not present

## 2024-02-25 DIAGNOSIS — L814 Other melanin hyperpigmentation: Secondary | ICD-10-CM | POA: Diagnosis not present

## 2024-02-25 DIAGNOSIS — L578 Other skin changes due to chronic exposure to nonionizing radiation: Secondary | ICD-10-CM | POA: Diagnosis not present

## 2024-02-25 DIAGNOSIS — L570 Actinic keratosis: Secondary | ICD-10-CM | POA: Diagnosis not present

## 2024-03-11 DIAGNOSIS — C44622 Squamous cell carcinoma of skin of right upper limb, including shoulder: Secondary | ICD-10-CM | POA: Diagnosis not present

## 2024-03-11 DIAGNOSIS — C44219 Basal cell carcinoma of skin of left ear and external auricular canal: Secondary | ICD-10-CM | POA: Diagnosis not present

## 2024-03-26 ENCOUNTER — Other Ambulatory Visit: Payer: Self-pay

## 2024-03-26 DIAGNOSIS — E782 Mixed hyperlipidemia: Secondary | ICD-10-CM

## 2024-03-26 DIAGNOSIS — E1169 Type 2 diabetes mellitus with other specified complication: Secondary | ICD-10-CM

## 2024-03-26 DIAGNOSIS — I152 Hypertension secondary to endocrine disorders: Secondary | ICD-10-CM

## 2024-03-29 ENCOUNTER — Ambulatory Visit: Payer: Medicare Other

## 2024-03-29 ENCOUNTER — Other Ambulatory Visit

## 2024-03-29 DIAGNOSIS — E1159 Type 2 diabetes mellitus with other circulatory complications: Secondary | ICD-10-CM | POA: Diagnosis not present

## 2024-03-29 DIAGNOSIS — E1169 Type 2 diabetes mellitus with other specified complication: Secondary | ICD-10-CM

## 2024-03-29 DIAGNOSIS — I152 Hypertension secondary to endocrine disorders: Secondary | ICD-10-CM | POA: Diagnosis not present

## 2024-03-29 DIAGNOSIS — R899 Unspecified abnormal finding in specimens from other organs, systems and tissues: Secondary | ICD-10-CM | POA: Diagnosis not present

## 2024-03-29 DIAGNOSIS — E782 Mixed hyperlipidemia: Secondary | ICD-10-CM

## 2024-03-30 ENCOUNTER — Encounter: Payer: Self-pay | Admitting: Family Medicine

## 2024-03-30 LAB — COMPREHENSIVE METABOLIC PANEL WITH GFR
ALT: 40 IU/L (ref 0–44)
AST: 24 IU/L (ref 0–40)
Albumin: 4 g/dL (ref 3.8–4.8)
Alkaline Phosphatase: 96 IU/L (ref 44–121)
BUN/Creatinine Ratio: 15 (ref 10–24)
BUN: 16 mg/dL (ref 8–27)
Bilirubin Total: 0.2 mg/dL (ref 0.0–1.2)
CO2: 26 mmol/L (ref 20–29)
Calcium: 9.8 mg/dL (ref 8.6–10.2)
Chloride: 98 mmol/L (ref 96–106)
Creatinine, Ser: 1.08 mg/dL (ref 0.76–1.27)
Globulin, Total: 2.6 g/dL (ref 1.5–4.5)
Glucose: 115 mg/dL — ABNORMAL HIGH (ref 70–99)
Potassium: 4.2 mmol/L (ref 3.5–5.2)
Sodium: 141 mmol/L (ref 134–144)
Total Protein: 6.6 g/dL (ref 6.0–8.5)
eGFR: 70 mL/min/1.73

## 2024-03-30 LAB — CBC WITH DIFFERENTIAL/PLATELET
Basophils Absolute: 0.1 10*3/uL (ref 0.0–0.2)
Basos: 0 %
EOS (ABSOLUTE): 0 10*3/uL (ref 0.0–0.4)
Eos: 0 %
Hematocrit: 39 % (ref 37.5–51.0)
Hemoglobin: 11.6 g/dL — ABNORMAL LOW (ref 13.0–17.7)
Immature Grans (Abs): 0 10*3/uL (ref 0.0–0.1)
Immature Granulocytes: 0 %
Lymphocytes Absolute: 2.3 10*3/uL (ref 0.7–3.1)
Lymphs: 21 %
MCH: 22.1 pg — ABNORMAL LOW (ref 26.6–33.0)
MCHC: 29.7 g/dL — ABNORMAL LOW (ref 31.5–35.7)
MCV: 74 fL — ABNORMAL LOW (ref 79–97)
Monocytes Absolute: 0.9 10*3/uL (ref 0.1–0.9)
Monocytes: 8 %
Neutrophils Absolute: 8.1 10*3/uL — ABNORMAL HIGH (ref 1.4–7.0)
Neutrophils: 71 %
Platelets: 337 10*3/uL (ref 150–450)
RBC: 5.25 x10E6/uL (ref 4.14–5.80)
RDW: 15.3 % (ref 11.6–15.4)
WBC: 11.4 10*3/uL — ABNORMAL HIGH (ref 3.4–10.8)

## 2024-03-30 LAB — MICROALBUMIN / CREATININE URINE RATIO
Creatinine, Urine: 40.8 mg/dL
Microalb/Creat Ratio: 8 mg/g{creat} (ref 0–29)
Microalbumin, Urine: 3.4 ug/mL

## 2024-03-30 LAB — LIPID PANEL
Chol/HDL Ratio: 2.7 ratio (ref 0.0–5.0)
Cholesterol, Total: 162 mg/dL (ref 100–199)
HDL: 61 mg/dL
LDL Chol Calc (NIH): 79 mg/dL (ref 0–99)
Triglycerides: 128 mg/dL (ref 0–149)
VLDL Cholesterol Cal: 22 mg/dL (ref 5–40)

## 2024-03-30 LAB — TSH: TSH: 5.01 u[IU]/mL — ABNORMAL HIGH (ref 0.450–4.500)

## 2024-03-30 LAB — HEMOGLOBIN A1C
Est. average glucose Bld gHb Est-mCnc: 148 mg/dL
Hgb A1c MFr Bld: 6.8 % — ABNORMAL HIGH (ref 4.8–5.6)

## 2024-04-01 ENCOUNTER — Ambulatory Visit: Payer: Medicare Other | Admitting: Family Medicine

## 2024-04-01 ENCOUNTER — Telehealth: Payer: Self-pay | Admitting: Family Medicine

## 2024-04-01 VITALS — BP 120/60 | HR 77 | Temp 98.2°F | Ht 65.0 in | Wt 160.0 lb

## 2024-04-01 DIAGNOSIS — E1169 Type 2 diabetes mellitus with other specified complication: Secondary | ICD-10-CM | POA: Diagnosis not present

## 2024-04-01 DIAGNOSIS — Z0000 Encounter for general adult medical examination without abnormal findings: Secondary | ICD-10-CM | POA: Diagnosis not present

## 2024-04-01 DIAGNOSIS — E782 Mixed hyperlipidemia: Secondary | ICD-10-CM

## 2024-04-01 DIAGNOSIS — J9611 Chronic respiratory failure with hypoxia: Secondary | ICD-10-CM

## 2024-04-01 DIAGNOSIS — R7989 Other specified abnormal findings of blood chemistry: Secondary | ICD-10-CM | POA: Diagnosis not present

## 2024-04-01 DIAGNOSIS — J449 Chronic obstructive pulmonary disease, unspecified: Secondary | ICD-10-CM

## 2024-04-01 DIAGNOSIS — I480 Paroxysmal atrial fibrillation: Secondary | ICD-10-CM

## 2024-04-01 DIAGNOSIS — F411 Generalized anxiety disorder: Secondary | ICD-10-CM | POA: Diagnosis not present

## 2024-04-01 DIAGNOSIS — I700 Atherosclerosis of aorta: Secondary | ICD-10-CM | POA: Diagnosis not present

## 2024-04-01 DIAGNOSIS — D649 Anemia, unspecified: Secondary | ICD-10-CM | POA: Diagnosis not present

## 2024-04-01 DIAGNOSIS — M8000XD Age-related osteoporosis with current pathological fracture, unspecified site, subsequent encounter for fracture with routine healing: Secondary | ICD-10-CM | POA: Diagnosis not present

## 2024-04-01 NOTE — Progress Notes (Unsigned)
 Subjective:  Patient ID: Paul [name], male    DOB: Dec 21, 1946  Age: 78 y.o. MRN: [id number]  Chief Complaint  Patient presents with   Medicare Wellness   Diabetes:  Complications: none Glucose checking: daily  Glucose logs: 96-129 Hypoglycemia: no  Most recent A1C: 6.7 Current medications:  Farxiga 10 mg daily Last Eye Exam: UTD Foot checks: daily   Hyperlipidemia: Current medications: on lipitor 40 mg once daily.    Hypertension/Atrial fibrillation: Current medications: Norvasc 2.5 mg daily, Lasix 40 mg DAILY.   COPD/Chronic respiratory failure with hypoxia: ON breztri 2 puffs twice daily. ALBUTEROL NEBULIZERS TWICE DAILY. On home oxygen 2 L at night..   Mild obstructive sleep apnea with AHI at 13.9 and SpO2 low at 93%.  Required 2 L of oxygen with Nocturnal CPAP at 9 cm H2O.    GERD: on protonix 40 mg twice daily.   GAD: on zoloft 50 mg once daily. Denies depression or anxiety. Has alprazolam once daily as needed. Very sparingly.    Osteoporosis: on prolia every 6 months and vitamin d 2000 U daily.  Atrial fibrillation. S/p ablation and watchman. Sees Dr. Marven Slimmer and Dr. Carolynne Citron. On digoxin.   Diet: Fairly healthy.   Last AWV 04/01/23  TSH abnormal. Added on T4.      04/01/2024   10:35 AM 05/14/2023   11:23 AM 04/01/2023   11:15 AM 03/26/2023    8:54 AM 11/22/2022    9:35 AM  Depression screen PHQ 2/9  Decreased Interest 0 0 0 0 0  Down, Depressed, Hopeless 0 0 0 0 0  PHQ - 2 Score 0 0 0 0 0        04/01/2024   10:35 AM  Fall Risk   Falls in the past year? 0  Number falls in past yr: 0  Injury with Fall? 0  Risk for fall due to : No Fall Risks  Follow up Falls evaluation completed    Patient Care Team: [name], MD as PCP - General (Family Medicine) Hugh Madura, MD as PCP - Cardiology (Cardiology) Boyce Byes, MD as PCP - Electrophysiology (Cardiology) Vernell Goldsmith, MD as Attending Physician (Pulmonary Disease) Diamond Formica, MD as  Consulting Physician (Pulmonary Disease) [facility] as Registered Nurse [facility])   Review of Systems  Constitutional:  Negative for chills, fatigue and fever.  HENT:  Negative for congestion, ear pain and sore throat.   Respiratory:  Negative for cough and shortness of breath.   Cardiovascular:  Negative for chest pain.  Gastrointestinal:  Negative for abdominal pain, constipation, diarrhea, nausea and vomiting.  Endocrine: Negative for polydipsia, polyphagia and polyuria.  Genitourinary:  Negative for dysuria and frequency.  Musculoskeletal:  Negative for arthralgias and myalgias.  Neurological:  Negative for dizziness and headaches.  Psychiatric/Behavioral:  Negative for dysphoric mood.        No dysphoria    Current Outpatient Medications on File Prior to Visit  Medication Sig Dispense Refill   Accu-Chek Softclix Lancets lancets Check fasting sugar once daily. 100 each 3   acetaminophen (TYLENOL) 500 MG tablet Take 1,000 mg by mouth at bedtime as needed (pain).     albuterol (PROAIR HFA) 108 (90 Base) MCG/ACT inhaler Inhale 2 puffs into the lungs as needed for wheezing or shortness of breath.     albuterol (PROVENTIL) (2.5 MG/3ML) 0.083% nebulizer solution Take 3 mLs (2.5 mg total) by nebulization every 6 (six) hours as needed  for shortness of breath or wheezing. 360 mL 5   ALPRAZolam (XANAX) 0.5 MG tablet Take 1 tablet (0.5 mg total) by mouth 2 (two) times daily as needed for anxiety. 30 tablet 0   amLODipine (NORVASC) 2.5 MG tablet Take 2.5 mg by mouth daily.     Ascorbic Acid (VITAMIN C) 1000 MG tablet Take 1,000 mg by mouth daily.     aspirin EC 81 MG tablet Take 1 tablet (81 mg total) by mouth daily. Swallow whole. 90 tablet 3   atorvastatin (LIPITOR) 40 MG tablet TAKE 1 TABLET BY MOUTH EVERYDAY AT BEDTIME 90 tablet 1   blood glucose meter kit and supplies KIT Dispense based on patient and insurance preference. Use up to four times daily as  directed. 1 each 0   Blood Glucose Monitoring Suppl DEVI Use to test blood sugar up to twice daily. May substitute to any manufacturer covered by patient's insurance. E11.0 1 each 0   Budeson-Glycopyrrol-Formoterol (BREZTRI AEROSPHERE) 160-9-4.8 MCG/ACT AERO Inhale 2 puffs into the lungs 2 (two) times daily. 3 each 4   calcium carbonate (OS-CAL) 600 MG TABS tablet Take 600 mg by mouth daily.     Cholecalciferol (VITAMIN D3) 50 MCG (2000 UT) TABS Take 2,000 Units by mouth in the morning.     dapagliflozin propanediol (FARXIGA) 10 MG TABS tablet Take 1 tablet (10 mg total) by mouth daily. 90 tablet 4   denosumab (PROLIA) 60 MG/ML SOSY injection Inject 60 mg into the skin every 6 (six) months. 180 mL 0   digoxin (LANOXIN) 0.125 MG tablet Take 1 tablet (0.125 mg total) by mouth daily. 90 tablet 3   ferrous sulfate 325 (65 FE) MG tablet Take 325 mg by mouth 2 (two) times daily with a meal.     fluticasone (FLONASE) 50 MCG/ACT nasal spray Place 2 sprays into both nostrils daily as needed for allergies or rhinitis.     furosemide (LASIX) 40 MG tablet Take 1 tablet by mouth daily, may take 1 extra tablet daily only as needed for weight gain or lower extremity swelling     Glucose Blood (BLOOD GLUCOSE TEST STRIPS) STRP Use to test blood sugar up to twice daily. May substitute to any manufacturer covered by patient's insurance. E11.0 100 strip 3   Lancet Device MISC Use to test blood sugar up to twice daily. May substitute to any manufacturer covered by patient's insurance. E11.0 1 each 0   Lancets Misc. MISC Use to test blood sugar up to twice daily. May substitute to any manufacturer covered by patient's insurance. E11.0 100 each 3   loratadine (CLARITIN) 10 MG tablet Take 10 mg by mouth in the morning.     Melatonin 12 MG TABS Take 12 mg by mouth at bedtime.     Multiple Vitamin (MULTIVITAMIN WITH MINERALS) TABS tablet Take 1 tablet by mouth daily with breakfast.     OXYGEN Inhale 2 L/min into the lungs at  bedtime.     pantoprazole (PROTONIX) 40 MG tablet TAKE 1 TABLET BY MOUTH TWICE A DAY 180 tablet 3   predniSONE (DELTASONE) 10 MG tablet Take 1 tablet (10 mg total) by mouth daily with breakfast. 90 tablet 1   Probiotic Product (PROBIOTIC DAILY) CAPS Take 1 capsule by mouth in the morning.     sertraline (ZOLOFT) 50 MG tablet TAKE 1 TABLET BY MOUTH EVERYDAY AT BEDTIME 90 tablet 1   No current facility-administered medications on file prior to visit.   Past Medical History:  Diagnosis Date   Anemia    Aortic atherosclerosis (HCC)    Aspiration pneumonia due to inhalation of vomitus (HCC) 02/03/2022   Atrial fibrillation (HCC)    Back pain    COPD (chronic obstructive pulmonary disease) (HCC)    COPD exacerbation (HCC) 12/27/2021   COVID-19 12/24/2020   Diabetes mellitus without complication (HCC)    Dizziness 04/08/2022   Elevated PSA    GAD (generalized anxiety disorder)    GERD (gastroesophageal reflux disease)    High cholesterol    Hypertension    Lingular pneumonia 01/05/2021   See cxr 01/06/20 rx with zpak/ cefipime x 5 days and then developed purulent sputum again 01/12/21 so rx levcaquin 500 mg daily x 7 days then return for cxr before more    Lung nodule < 6cm on CT 05/29/2016   Osteoporosis    Oxygen deficiency    Pneumonia    January 2022   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 Marven Slimmer with a 20 mm Watchman FLX device   RSV bronchitis 01/27/2022   Vertebral compression fracture (HCC) 05/29/2019   T8 compression fracture noted on CT scan 05/28/2019 inpatient with chronic steroid dependent COPD - rx calcitonin nasal spray rx per PCP    Past Surgical History:  Procedure Laterality Date   CATARACT EXTRACTION Right    ELBOW SURGERY  07/2022   surgery on olecranon bursa. Dr. Delmar Ferrara   IR KYPHO EA ADDL LEVEL THORACIC OR LUMBAR  11/28/2021   LEFT ATRIAL APPENDAGE OCCLUSION N/A 08/02/2021   Procedure: LEFT ATRIAL APPENDAGE  OCCLUSION;  Surgeon: Arnoldo Lapping, MD;  Location: [facility] INVASIVE CV LAB;  Service: Cardiovascular;  Laterality: N/A;   SKIN SURGERY     shoulders and chest   TEE WITHOUT CARDIOVERSION N/A 08/02/2021   Procedure: TRANSESOPHAGEAL ECHOCARDIOGRAM (TEE);  Surgeon: Arnoldo Lapping, MD;  Location: [facility] INVASIVE CV LAB;  Service: Cardiovascular;  Laterality: N/A;   TEE WITHOUT CARDIOVERSION N/A 09/13/2021   Procedure: TRANSESOPHAGEAL ECHOCARDIOGRAM (TEE);  Surgeon: Luana Rumple, MD;  Location: [facility] ENDOSCOPY;  Service: Cardiovascular;  Laterality: N/A;    Family History  Problem Relation Age of Onset   Heart disease Brother    Heart disease Mother    Heart disease Father    Cancer Paternal Uncle    Social History   Socioeconomic History   Marital status: Married    Spouse name: Not on file   Number of children: 3   Years of education: Not on file   Highest education level: 10th grade  Occupational History   Occupation: retired    Comment: truck Hospital doctor  Tobacco Use   Smoking status: Former    Current packs/day: 0.00    Average packs/day: 2.0 packs/day for 52.0 years (104.0 ttl pk-yrs)    Types: Cigarettes    Start date: 02/03/1957    Quit date: 02/03/2009    Years since quitting: 15.1   Smokeless tobacco: Never   Tobacco comments:    Counseled to remain smoke free  Vaping Use   Vaping status: Never Used  Substance and Sexual Activity   Alcohol use: Not Currently    Alcohol/week: 0.0 standard drinks of alcohol    Comment: occ   Drug use: No   Sexual activity: Not on file  Other Topics Concern   Not on file  Social History Narrative   Originally from [place]. Previously worked driving a Surveyor, minerals. No pets currently. No mold exposure. 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       wears sunscreen, brushes and flosses daily, see's dentist bi-annually, has smoke/carbon monoxide detectors, wears a seatbelt and practices  gun safety      Social Drivers of Health   Financial Resource Strain: Low Risk  (07/19/2023)   Overall Financial Resource Strain (CARDIA)    Difficulty of Paying Living Expenses: Not hard at all  Food Insecurity: No Food Insecurity (07/19/2023)   Hunger Vital Sign    Worried About Running Out of Food in the Last Year: Never true    Ran Out of Food in the Last Year: Never true  Transportation Needs: No Transportation Needs (07/19/2023)   PRAPARE - Administrator, Civil Service (Medical): No    Lack of Transportation (Non-Medical): No  Physical Activity: Insufficiently Active (07/19/2023)   Exercise Vital Sign    Days of Exercise per Week: 3 days    Minutes of Exercise per Session: 30 min  Stress: No Stress Concern Present (07/19/2023)   [employer] of Occupational Health - Occupational Stress Questionnaire    Feeling of Stress : Not at all  Social Connections: Socially Integrated (07/19/2023)   Social Connection and Isolation Panel [NHANES]    Frequency of Communication with Friends and Family: More than three times a week    Frequency of Social Gatherings with Friends and Family: More than three times a week    Attends Religious Services: More than 4 times per year    Active Member of [employer] or Organizations: Yes    Attends Engineer, structural: More than 4 times per year    Marital Status: Married    Objective:  BP 120/60   Pulse 77   Temp 98.2 F (36.8 C)   Ht 5\' 5"  (1.651 m)   Wt 160 lb (72.6 kg)   SpO2 98%   BMI 26.63 kg/m      04/01/2024   10:22 AM 02/16/2024   10:09 AM 12/30/2023    8:37 AM  BP/Weight  Systolic BP 120 138 134  Diastolic BP 60 52 62  Wt. (Lbs) 160 160 162.4  BMI 26.63 kg/m2 26.63 kg/m2 27.02 kg/m2    Physical Exam Vitals reviewed.  Constitutional:      Appearance: Normal appearance.  Neck:     Vascular: No carotid bruit.  Cardiovascular:     Rate and Rhythm: Normal rate and regular rhythm.     Pulses: Normal pulses.      Heart sounds: Normal heart sounds.  Pulmonary:     Effort: Pulmonary effort is normal.     Breath sounds: Normal breath sounds. No wheezing, rhonchi or rales.  Abdominal:     General: Bowel sounds are normal.     Palpations: Abdomen is soft.     Tenderness: There is no abdominal tenderness.  Neurological:     Mental Status: He is alert.  Psychiatric:        Mood and Affect: Mood normal.        Behavior: Behavior normal.     Diabetic Foot Exam - Simple   Simple Foot Form Diabetic Foot exam was performed with the following findings: Yes 04/01/2024  9:25 PM  Visual Inspection No deformities, no ulcerations, no other skin breakdown bilaterally: Yes Sensation Testing Intact to touch and monofilament testing bilaterally: Yes Pulse Check Posterior Tibialis and Dorsalis pulse intact bilaterally: Yes Comments  Lab Results  Component Value Date   WBC 11.4 (H) 03/29/2024   HGB 11.6 (L) 03/29/2024   HCT 39.0 03/29/2024   PLT 337 03/29/2024   GLUCOSE 115 (H) 03/29/2024   CHOL 162 03/29/2024   TRIG 128 03/29/2024   HDL 61 03/29/2024   LDLCALC 79 03/29/2024   ALT 40 03/29/2024   AST 24 03/29/2024   NA 141 03/29/2024   K 4.2 03/29/2024   CL 98 03/29/2024   CREATININE 1.08 03/29/2024   BUN 16 03/29/2024   CO2 26 03/29/2024   TSH 5.010 (H) 03/29/2024   INR 0.9 01/25/2023   HGBA1C 6.8 (H) 03/29/2024   MICROALBUR Negative 10/09/2021      Assessment & Plan:    COPD GOLD  III  Assessment & Plan: Well controlled on current regimen. Continue on current regimen.     Atherosclerosis of aorta (HCC) Assessment & Plan: Stable.  Continue lipitor 40 mg before bed and aspirin 81 mg once daily.    Chronic anemia Assessment & Plan: Add on iron studies.    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HCC) Assessment & Plan: Diabetes and cholesterol at goal Recommend check sugars fasting daily. Recommend check feet daily. Recommend annual eye exams. Medicines:  Farxiga 10 mg daily and lipitor 40 mg before bed.  Continue to work on eating a healthy diet.  Labs reviewed   Age-related osteoporosis with current pathological fracture with routine healing, subsequent encounter Assessment & Plan: Continue prolia 60 mg every 6 months.    GAD (generalized anxiety disorder) Assessment & Plan: Continue zoloft 50 mg once daily and xanax once daily as needed..    Chronic respiratory failure with hypoxia (HCC) Assessment & Plan: Continue oxygen 2 L at night.     Paroxysmal atrial fibrillation [facility]) Assessment & Plan: Patient s/p Watchman with paroxysmal atrial fibrillation Continue Digoxin 0.125mg  QD. Digoxin level 0.8 on 2/21. Management per specialist.       No orders of the defined types were placed in this encounter.   No orders of the defined types were placed in this encounter.    Follow-up: Return in 3 months (on 07/01/2024) for chronic follow up.   I,Katherina A Bramblett,acting as a scribe for [name], MD.,have documented all relevant documentation on the behalf of [name], MD,as directed by  [name], MD while in the presence of [name], MD.   Gladys Lamp I Leal-Borjas,acting as a scribe for [name], MD.,have documented all relevant documentation on the behalf of [name], MD,as directed by  [name], MD while in the presence of [name], MD.    An After Visit Summary was printed and given to the patient.  I attest that I have reviewed this visit and agree with the plan scribed by my staff.   [name], MD [facility] [phone]

## 2024-04-01 NOTE — Progress Notes (Signed)
 Subjective:   Paul [name] is a 78 y.o. male who presents for Medicare Annual/Subsequent preventive examination.  Visit Complete: In person  Patient Medicare AWV questionnaire was completed by the patient; I have confirmed that all information answered by patient is correct and no changes since this date.  Cardiac Risk Factors include: diabetes mellitus;hypertension;advanced age (>41men, >87 women)     Objective:    Today's Vitals   04/01/24 1022  BP: 120/60  Pulse: 77  Temp: 98.2 F (36.8 C)  SpO2: 98%  Weight: 160 lb (72.6 kg)  Height: 5\' 5"  (1.651 m)   Body mass index is 26.63 kg/m.     04/01/2024   10:34 AM 08/20/2023    2:06 PM 05/29/2023    1:25 PM 05/12/2023    4:09 PM 03/11/2023    8:19 PM 02/23/2023    1:50 PM 02/19/2023   11:00 AM  Advanced Directives  Does Patient Have a Medical Advance Directive? No;Yes No No No Yes No   Type of Diplomatic Services operational officer;Living will    Healthcare Power of [place];Living will  Healthcare Power of [place];Living will  Does patient want to make changes to medical advance directive? No - Patient declined    No - Patient declined  No - Guardian declined  Copy of Healthcare Power of Attorney in Chart? No - copy requested    Yes - validated most recent copy scanned in chart (See row information)  Yes - validated most recent copy scanned in chart (See row information)  Would patient like information on creating a medical advance directive? No - Patient declined No - Patient declined         Current Medications (verified) Outpatient Encounter Medications as of 04/01/2024  Medication Sig   Accu-Chek Softclix Lancets lancets Check fasting sugar once daily.   acetaminophen (TYLENOL) 500 MG tablet Take 1,000 mg by mouth at bedtime as needed (pain).   albuterol (PROAIR HFA) 108 (90 Base) MCG/ACT inhaler Inhale 2 puffs into the lungs as needed for wheezing or shortness of breath.   albuterol (PROVENTIL) (2.5 MG/3ML)  0.083% nebulizer solution Take 3 mLs (2.5 mg total) by nebulization every 6 (six) hours as needed for shortness of breath or wheezing.   ALPRAZolam (XANAX) 0.5 MG tablet Take 1 tablet (0.5 mg total) by mouth 2 (two) times daily as needed for anxiety.   amLODipine (NORVASC) 2.5 MG tablet Take 2.5 mg by mouth daily.   Ascorbic Acid (VITAMIN C) 1000 MG tablet Take 1,000 mg by mouth daily.   aspirin EC 81 MG tablet Take 1 tablet (81 mg total) by mouth daily. Swallow whole.   atorvastatin (LIPITOR) 40 MG tablet TAKE 1 TABLET BY MOUTH EVERYDAY AT BEDTIME   blood glucose meter kit and supplies KIT Dispense based on patient and insurance preference. Use up to four times daily as directed.   Blood Glucose Monitoring Suppl DEVI Use to test blood sugar up to twice daily. May substitute to any manufacturer covered by patient's insurance. E11.0   Budeson-Glycopyrrol-Formoterol (BREZTRI AEROSPHERE) 160-9-4.8 MCG/ACT AERO Inhale 2 puffs into the lungs 2 (two) times daily.   calcium carbonate (OS-CAL) 600 MG TABS tablet Take 600 mg by mouth daily.   Cholecalciferol (VITAMIN D3) 50 MCG (2000 UT) TABS Take 2,000 Units by mouth in the morning.   dapagliflozin propanediol (FARXIGA) 10 MG TABS tablet Take 1 tablet (10 mg total) by mouth daily.   denosumab (PROLIA) 60 MG/ML SOSY injection Inject 60 mg  into the skin every 6 (six) months.   digoxin (LANOXIN) 0.125 MG tablet Take 1 tablet (0.125 mg total) by mouth daily.   ferrous sulfate 325 (65 FE) MG tablet Take 325 mg by mouth 2 (two) times daily with a meal.   fluticasone (FLONASE) 50 MCG/ACT nasal spray Place 2 sprays into both nostrils daily as needed for allergies or rhinitis.   furosemide (LASIX) 40 MG tablet Take 1 tablet by mouth daily, may take 1 extra tablet daily only as needed for weight gain or lower extremity swelling   Glucose Blood (BLOOD GLUCOSE TEST STRIPS) STRP Use to test blood sugar up to twice daily. May substitute to any manufacturer covered by  patient's insurance. E11.0   Lancet Device MISC Use to test blood sugar up to twice daily. May substitute to any manufacturer covered by patient's insurance. E11.0   Lancets Misc. MISC Use to test blood sugar up to twice daily. May substitute to any manufacturer covered by patient's insurance. E11.0   loratadine (CLARITIN) 10 MG tablet Take 10 mg by mouth in the morning.   Melatonin 12 MG TABS Take 12 mg by mouth at bedtime.   Multiple Vitamin (MULTIVITAMIN WITH MINERALS) TABS tablet Take 1 tablet by mouth daily with breakfast.   OXYGEN Inhale 2 L/min into the lungs at bedtime.   pantoprazole (PROTONIX) 40 MG tablet TAKE 1 TABLET BY MOUTH TWICE A DAY   predniSONE (DELTASONE) 10 MG tablet Take 1 tablet (10 mg total) by mouth daily with breakfast.   Probiotic Product (PROBIOTIC DAILY) CAPS Take 1 capsule by mouth in the morning.   sertraline (ZOLOFT) 50 MG tablet TAKE 1 TABLET BY MOUTH EVERYDAY AT BEDTIME   No facility-administered encounter medications on file as of 04/01/2024.    Allergies (verified) Tape, Daliresp [roflumilast], Levaquin [levofloxacin], and Alendronate   History: Past Medical History:  Diagnosis Date   Anemia    Aortic atherosclerosis (HCC)    Aspiration pneumonia due to inhalation of vomitus (HCC) 02/03/2022   Atrial fibrillation (HCC)    Back pain    COPD (chronic obstructive pulmonary disease) (HCC)    COPD exacerbation (HCC) 12/27/2021   COVID-19 12/24/2020   Diabetes mellitus without complication (HCC)    Dizziness 04/08/2022   Elevated PSA    GAD (generalized anxiety disorder)    GERD (gastroesophageal reflux disease)    High cholesterol    Hypertension    Lingular pneumonia 01/05/2021   See cxr 01/06/20 rx with zpak/ cefipime x 5 days and then developed purulent sputum again 01/12/21 so rx levcaquin 500 mg daily x 7 days then return for cxr before more    Lung nodule < 6cm on CT 05/29/2016   Osteoporosis    Oxygen deficiency    Pneumonia    January 2022    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 Marven Slimmer with a 20 mm Watchman FLX device   RSV bronchitis 01/27/2022   Vertebral compression fracture (HCC) 05/29/2019   T8 compression fracture noted on CT scan 05/28/2019 inpatient with chronic steroid dependent COPD - rx calcitonin nasal spray rx per PCP    Past Surgical History:  Procedure Laterality Date   CATARACT EXTRACTION Right    ELBOW SURGERY  07/2022   surgery on olecranon bursa. Dr. Delmar Ferrara   IR KYPHO EA ADDL LEVEL THORACIC OR LUMBAR  11/28/2021   LEFT ATRIAL APPENDAGE OCCLUSION N/A 08/02/2021   Procedure: LEFT ATRIAL APPENDAGE OCCLUSION;  Surgeon:  Arnoldo Lapping, MD;  Location: [facility] INVASIVE CV LAB;  Service: Cardiovascular;  Laterality: N/A;   SKIN SURGERY     shoulders and chest   TEE WITHOUT CARDIOVERSION N/A 08/02/2021   Procedure: TRANSESOPHAGEAL ECHOCARDIOGRAM (TEE);  Surgeon: Arnoldo Lapping, MD;  Location: [facility] INVASIVE CV LAB;  Service: Cardiovascular;  Laterality: N/A;   TEE WITHOUT CARDIOVERSION N/A 09/13/2021   Procedure: TRANSESOPHAGEAL ECHOCARDIOGRAM (TEE);  Surgeon: Luana Rumple, MD;  Location: [facility] ENDOSCOPY;  Service: Cardiovascular;  Laterality: N/A;   Family History  Problem Relation Age of Onset   Heart disease Brother    Heart disease Mother    Heart disease Father    Cancer Paternal Uncle    Social History   Socioeconomic History   Marital status: Married    Spouse name: Not on file   Number of children: 3   Years of education: Not on file   Highest education level: 10th grade  Occupational History   Occupation: retired    Comment: truck Hospital doctor  Tobacco Use   Smoking status: Former    Current packs/day: 0.00    Average packs/day: 2.0 packs/day for 52.0 years (104.0 ttl pk-yrs)    Types: Cigarettes    Start date: 02/03/1957    Quit date: 02/03/2009    Years since quitting: 15.1   Smokeless tobacco: Never   Tobacco comments:    Counseled to remain smoke  free  Vaping Use   Vaping status: Never Used  Substance and Sexual Activity   Alcohol use: Not Currently    Alcohol/week: 0.0 standard drinks of alcohol    Comment: occ   Drug use: No   Sexual activity: Not on file  Other Topics Concern   Not on file  Social History Narrative   Originally from [place]. Previously worked driving a Surveyor, minerals. No pets currently. No mold exposure. 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       wears sunscreen, brushes and flosses daily, see's dentist bi-annually, has smoke/carbon monoxide detectors, wears a seatbelt and practices gun safety      Social Drivers of Health   Financial Resource Strain: Low Risk  (07/19/2023)   Overall Financial Resource Strain (CARDIA)    Difficulty of Paying Living Expenses: Not hard at all  Food Insecurity: No Food Insecurity (07/19/2023)   Hunger Vital Sign    Worried About Running Out of Food in the Last Year: Never true    Ran Out of Food in the Last Year: Never true  Transportation Needs: No Transportation Needs (07/19/2023)   PRAPARE - Administrator, Civil Service (Medical): No    Lack of Transportation (Non-Medical): No  Physical Activity: Insufficiently Active (07/19/2023)   Exercise Vital Sign    Days of Exercise per Week: 3 days    Minutes of Exercise per Session: 30 min  Stress: No Stress Concern Present (07/19/2023)   [employer] of Occupational Health - Occupational Stress Questionnaire    Feeling of Stress : Not at all  Social Connections: Socially Integrated (07/19/2023)   Social Connection and Isolation Panel [NHANES]    Frequency of Communication with Friends and Family: More than three times a week    Frequency of Social Gatherings with Friends and Family: More than three times a week    Attends Religious Services: More than 4 times per year    Active Member of Clubs or Organizations: Yes    Attends  Hospital doctor: More than 4 times per year    Marital Status: Married    Tobacco Counseling Counseling given: Not Answered Tobacco comments: Counseled to remain smoke free   Clinical Intake:     Pain : No/denies pain     Nutritional Status: BMI 25 -29 Overweight Nutritional Risks: None Diabetes: Yes CBG done?: No Did pt. bring in CBG monitor from home?: No  How often do you need to have someone help you when you read instructions, pamphlets, or other written materials from your doctor or pharmacy?: 1 - Never  Interpreter Needed?: No      Activities of Daily Living    04/01/2024   10:35 AM  In your present state of health, do you have any difficulty performing the following activities:  Hearing? 0  Vision? 0  Difficulty concentrating or making decisions? 0  Walking or climbing stairs? 0  Dressing or bathing? 0  Doing errands, shopping? 0  Preparing Food and eating ? N  Using the Toilet? N  In the past six months, have you accidently leaked urine? N  Do you have problems with loss of bowel control? N  Managing your Medications? N  Managing your Finances? N  Housekeeping or managing your Housekeeping? N    Patient Care Team: [name], MD as PCP - General (Family Medicine) Hugh Madura, MD as PCP - Cardiology (Cardiology) Boyce Byes, MD as PCP - Electrophysiology (Cardiology) Vernell Goldsmith, MD as Attending Physician (Pulmonary Disease) Diamond Formica, MD as Consulting Physician (Pulmonary Disease) [facility] as Registered Nurse [facility] and Palliative Medicine)  Indicate any recent Medical Services you may have received from other than [facility] providers in the past year (date may be approximate).     Assessment:   This is a routine wellness examination for [place].  Hearing/Vision screen No results found.   Goals Addressed             This Visit's Progress    Exercise 3x per week (30 min per time)        Depression  Screen    04/01/2024   10:35 AM 05/14/2023   11:23 AM 04/01/2023   11:15 AM 03/26/2023    8:54 AM 11/22/2022    9:35 AM 08/16/2022    8:20 AM 02/15/2022    8:33 AM  PHQ 2/9 Scores  PHQ - 2 Score 0 0 0 0 0 0 0    Fall Risk    04/01/2024   10:35 AM 07/23/2023    9:58 AM 05/14/2023   11:23 AM 04/01/2023   11:02 AM 11/22/2022    9:34 AM  Fall Risk   Falls in the past year? 0 1 1 0 0  Number falls in past yr: 0 0 1 0 0  Injury with Fall? 0 0 0 0 0  Risk for fall due to : No Fall Risks No Fall Risks History of fall(s) No Fall Risks History of fall(s)  Follow up Falls evaluation completed  Falls evaluation completed;Falls prevention discussed Falls evaluation completed;Education provided Falls evaluation completed    MEDICARE RISK AT HOME: Medicare Risk at Home Any stairs in or around the home?: No Home free of loose throw rugs in walkways, pet beds, electrical cords, etc?: Yes Adequate lighting in your home to reduce risk of falls?: Yes Life alert?: No Use of a cane, walker or w/c?: Yes Shower chair or bench in shower?: Yes Elevated toilet seat or a  handicapped toilet?: Yes  TIMED UP AND GO:  Was the test performed?  Yes  Length of time to ambulate 10 feet: 2 sec Gait steady and fast without use of assistive device    Cognitive Function:        04/01/2024   10:30 AM 04/01/2023   11:23 AM  6CIT Screen  What Year? 0 points 0 points  What month? 0 points 0 points  What time? 0 points 0 points  Count back from 20 0 points 0 points  Months in reverse 0 points 0 points  Repeat phrase 2 points 0 points  Total Score 2 points 0 points    Immunizations Immunization History  Administered Date(s) Administered   Fluad Quad(high Dose 65+) 10/02/2020, 10/09/2021, 09/05/2022, 10/17/2022   Fluad Trivalent(High Dose 65+) 10/30/2023   Influenza Split 09/22/2013, 09/01/2015   Influenza Whole 09/04/2011, 09/21/2012   Influenza, High Dose Seasonal PF 09/02/2016, 09/10/2017, 08/31/2018,  09/24/2018, 10/21/2019   Influenza-Unspecified 08/23/2014   Moderna Covid-19 Vaccine Bivalent Booster 50yrs & up 10/09/2021   Moderna SARS-COV2 Booster Vaccination 07/05/2021   Moderna Sars-Covid-2 Vaccination 01/17/2020, 02/14/2020, 10/06/2020   PNEUMOCOCCAL CONJUGATE-20 12/05/2022   Pfizer(Comirnaty)Fall Seasonal Vaccine 12 years and older 10/31/2022, 10/30/2023   Pneumococcal Conjugate-13 01/11/2014, 12/09/2016   Pneumococcal Polysaccharide-23 08/23/2010, 08/29/2011   Respiratory Syncytial Virus Vaccine,Recomb Aduvanted(Arexvy) 09/23/2022   Tdap 02/01/2019   Zoster Recombinant(Shingrix) 04/04/2014    Screening Tests Health Maintenance  Topic Date Due   DEXA SCAN  Never done   OPHTHALMOLOGY EXAM  Never done   Hepatitis C Screening  Never done   Zoster Vaccines- Shingrix (2 of 2) 05/30/2014   COVID-19 Vaccine (7 - Moderna risk 2024-25 season) 04/28/2024   INFLUENZA VACCINE  07/23/2024   HEMOGLOBIN A1C  09/28/2024   Diabetic kidney evaluation - eGFR measurement  03/29/2025   Diabetic kidney evaluation - Urine ACR  03/29/2025   FOOT EXAM  04/01/2025   Medicare Annual Wellness (AWV)  04/04/2025   DTaP/Tdap/Td (2 - Td or Tdap) 02/01/2029   Pneumonia Vaccine 46+ Years old  Completed   HPV VACCINES  Aged Out   Meningococcal B Vaccine  Aged Out   Lung Cancer Screening  Discontinued   Colonoscopy  Discontinued    Health Maintenance  Health Maintenance Due  Topic Date Due   DEXA SCAN  Never done   OPHTHALMOLOGY EXAM  Never done   Hepatitis C Screening  Never done   Zoster Vaccines- Shingrix (2 of 2) 05/30/2014    Additional Screening:  Vision Screening: Recommended annual ophthalmology exams for early detection of glaucoma and other disorders of the eye. Is the patient up to date with their annual eye exam?  Yes  Who is the provider or what is the name of the office in which the patient attends annual eye exams? Dr. Garland Junk office If pt is not established with a provider,  would they like to be referred to a provider to establish care? No .   Dental Screening: Recommended annual dental exams for proper oral hygiene  Diabetic Foot Exam: Diabetic Foot Exam: Completed 04/01/2024.  Community Resource Referral / Chronic Care Management: CRR required this visit?  No   CCM required this visit?  No     Plan:   Encounter for Medicare annual wellness exam Assessment & Plan: Things to do to keep yourself healthy  - Exercise at least 30-45 minutes a day, 3-4 days a week.  - Eat a low-fat diet with lots of fruits and vegetables,  up to 7-9 servings per day.  - Seatbelts can save your life. Wear them always.  - Smoke detectors on every level of your home, check batteries every year.  - Eye Doctor - have an eye exam every 1-2 years  - Safe sex - if you may be exposed to STDs, use a condom.  - Alcohol -  If you drink, do it moderately, less than 2 drinks per day.  - Health Care Power of Attorney. Choose someone to speak for you if you are not able.  - Depression is common in our stressful world.If you're feeling down or losing interest in things you normally enjoy, please come in for a visit.  - Violence - If anyone is threatening or hurting you, please call immediately.    I have personally reviewed and noted the following in the patient's chart:   Medical and social history Use of alcohol, tobacco or illicit drugs  Current medications and supplements including opioid prescriptions. Patient is not currently taking opioid prescriptions. Functional ability and status Nutritional status Physical activity Advanced directives List of other physicians Hospitalizations, surgeries, and ER visits in previous 12 months Vitals Screenings to include cognitive, depression, and falls Referrals and appointments  In addition, I have reviewed and discussed with patient certain preventive protocols, quality metrics, and best practice recommendations. A written personalized  care plan for preventive services as well as general preventive health recommendations were provided to patient.    I attest that I have reviewed this visit and agree with the plan scribed by my staff.   [name], MD [facility] [phone]

## 2024-04-01 NOTE — Telephone Encounter (Signed)
 [facility] Verbal Orders Signature request

## 2024-04-02 LAB — IRON AND TIBC
Iron Saturation: 14 % — ABNORMAL LOW (ref 15–55)
Iron: 46 ug/dL (ref 38–169)
Total Iron Binding Capacity: 327 ug/dL (ref 250–450)
UIBC: 281 ug/dL (ref 111–343)

## 2024-04-02 LAB — T4, FREE: Free T4: 0.98 ng/dL (ref 0.82–1.77)

## 2024-04-02 LAB — SPECIMEN STATUS REPORT

## 2024-04-04 DIAGNOSIS — Z0000 Encounter for general adult medical examination without abnormal findings: Secondary | ICD-10-CM | POA: Insufficient documentation

## 2024-04-04 NOTE — Assessment & Plan Note (Signed)
 Things to do to keep yourself healthy  - Exercise at least 30-45 minutes a day, 3-4 days a week.  - Eat a low-fat diet with lots of fruits and vegetables, up to 7-9 servings per day.  - Seatbelts can save your life. Wear them always.  - Smoke detectors on every level of your home, check batteries every year.  - Eye Doctor - have an eye exam every 1-2 years  - Safe sex - if you may be exposed to STDs, use a condom.  - Alcohol -  If you drink, do it moderately, less than 2 drinks per day.  - Health Care Power of Attorney. Choose someone to speak for you if you are not able.  - Depression is common in our stressful world.If you're feeling down or losing interest in things you normally enjoy, please come in for a visit.  - Violence - If anyone is threatening or hurting you, please call immediately.

## 2024-04-05 ENCOUNTER — Encounter: Payer: Self-pay | Admitting: Family Medicine

## 2024-04-05 DIAGNOSIS — R7989 Other specified abnormal findings of blood chemistry: Secondary | ICD-10-CM | POA: Insufficient documentation

## 2024-04-05 NOTE — Assessment & Plan Note (Signed)
Continue zoloft 50 mg once daily and xanax once daily as needed.Marland Kitchen

## 2024-04-05 NOTE — Assessment & Plan Note (Signed)
Continue prolia 60 mg every 6 months.

## 2024-04-05 NOTE — Assessment & Plan Note (Signed)
Continue oxygen 2 L at night.

## 2024-04-05 NOTE — Assessment & Plan Note (Signed)
 Add on iron studies

## 2024-04-05 NOTE — Assessment & Plan Note (Signed)
Add on free T4

## 2024-04-05 NOTE — Assessment & Plan Note (Signed)
 Stable.  Continue lipitor 40 mg before bed and aspirin 81 mg once daily.

## 2024-04-05 NOTE — Assessment & Plan Note (Addendum)
 Patient s/p Watchman with paroxysmal atrial fibrillation Continue Digoxin 0.125mg  QD. Digoxin level 0.8 on 2/21. Management per specialist.

## 2024-04-05 NOTE — Assessment & Plan Note (Signed)
 Well controlled on current regimen. Continue on current regimen.

## 2024-04-05 NOTE — Assessment & Plan Note (Addendum)
 Diabetes and cholesterol at goal Recommend check sugars fasting daily. Recommend check feet daily. Recommend annual eye exams. Medicines: Farxiga 10 mg daily and lipitor 40 mg before bed.  Continue to work on eating a healthy diet.  Labs reviewed

## 2024-04-05 NOTE — Assessment & Plan Note (Signed)
Well controlled.  No changes to medicines. Continue lipitor 40 mg before bed.  Continue to work on eating a healthy diet. Labs reviewed.

## 2024-04-06 ENCOUNTER — Other Ambulatory Visit: Payer: Self-pay

## 2024-04-06 DIAGNOSIS — E039 Hypothyroidism, unspecified: Secondary | ICD-10-CM

## 2024-04-06 MED ORDER — LEVOTHYROXINE SODIUM 25 MCG PO TABS
25.0000 ug | ORAL_TABLET | Freq: Every day | ORAL | 0 refills | Status: DC
Start: 2024-04-06 — End: 2024-05-20

## 2024-04-19 ENCOUNTER — Other Ambulatory Visit: Payer: Self-pay

## 2024-04-19 DIAGNOSIS — M810 Age-related osteoporosis without current pathological fracture: Secondary | ICD-10-CM

## 2024-04-19 DIAGNOSIS — M8008XS Age-related osteoporosis with current pathological fracture, vertebra(e), sequela: Secondary | ICD-10-CM

## 2024-04-19 MED ORDER — DENOSUMAB 60 MG/ML ~~LOC~~ SOSY
60.0000 mg | PREFILLED_SYRINGE | Freq: Once | SUBCUTANEOUS | Status: AC
  Administered 2024-04-29: 60 mg via SUBCUTANEOUS

## 2024-04-21 ENCOUNTER — Other Ambulatory Visit: Payer: Self-pay | Admitting: Family Medicine

## 2024-04-24 ENCOUNTER — Other Ambulatory Visit: Payer: Self-pay | Admitting: Cardiology

## 2024-04-25 ENCOUNTER — Other Ambulatory Visit: Payer: Self-pay | Admitting: Adult Health

## 2024-04-29 ENCOUNTER — Ambulatory Visit (INDEPENDENT_AMBULATORY_CARE_PROVIDER_SITE_OTHER)

## 2024-04-29 DIAGNOSIS — M810 Age-related osteoporosis without current pathological fracture: Secondary | ICD-10-CM

## 2024-04-29 DIAGNOSIS — M8008XS Age-related osteoporosis with current pathological fracture, vertebra(e), sequela: Secondary | ICD-10-CM

## 2024-04-29 MED ORDER — DENOSUMAB 60 MG/ML ~~LOC~~ SOSY
60.0000 mg | PREFILLED_SYRINGE | Freq: Once | SUBCUTANEOUS | Status: AC
Start: 2024-10-30 — End: 2024-11-04
  Administered 2024-11-04: 60 mg via SUBCUTANEOUS

## 2024-04-29 NOTE — Progress Notes (Signed)
     Patient: Paul [name]  DOB: 1946-08-04  MRN: [id number]    Visit Date: 04/29/2024    [name] presents today for his six month Prolia  injection.  1 x 60 mg Single-Dose Prefilled Syringe was given SQ in the left arm.  Patient tolerated the injection well and has no questions.  The next Prolia  injection will be due in six months.  Administrations This Visit     denosumab  (PROLIA ) injection 60 mg     Admin Date 04/29/2024 Action Given Dose 60 mg Route Subcutaneous Documented By Zerita Hill, CMA              Zerita Hill, CMA

## 2024-05-18 ENCOUNTER — Other Ambulatory Visit

## 2024-05-19 ENCOUNTER — Other Ambulatory Visit

## 2024-05-19 DIAGNOSIS — E039 Hypothyroidism, unspecified: Secondary | ICD-10-CM | POA: Diagnosis not present

## 2024-05-20 ENCOUNTER — Ambulatory Visit (INDEPENDENT_AMBULATORY_CARE_PROVIDER_SITE_OTHER): Payer: Medicare Other | Admitting: Adult Health

## 2024-05-20 ENCOUNTER — Ambulatory Visit: Payer: Self-pay | Admitting: Family Medicine

## 2024-05-20 ENCOUNTER — Encounter: Payer: Self-pay | Admitting: Adult Health

## 2024-05-20 VITALS — BP 140/48 | HR 74 | Temp 97.8°F | Ht 65.0 in | Wt 162.0 lb

## 2024-05-20 DIAGNOSIS — J9611 Chronic respiratory failure with hypoxia: Secondary | ICD-10-CM

## 2024-05-20 DIAGNOSIS — G4733 Obstructive sleep apnea (adult) (pediatric): Secondary | ICD-10-CM

## 2024-05-20 DIAGNOSIS — E039 Hypothyroidism, unspecified: Secondary | ICD-10-CM

## 2024-05-20 DIAGNOSIS — I5032 Chronic diastolic (congestive) heart failure: Secondary | ICD-10-CM | POA: Diagnosis not present

## 2024-05-20 DIAGNOSIS — J449 Chronic obstructive pulmonary disease, unspecified: Secondary | ICD-10-CM

## 2024-05-20 DIAGNOSIS — Z87891 Personal history of nicotine dependence: Secondary | ICD-10-CM | POA: Diagnosis not present

## 2024-05-20 LAB — TSH: TSH: 3.79 u[IU]/mL (ref 0.450–4.500)

## 2024-05-20 LAB — T4, FREE: Free T4: 0.99 ng/dL (ref 0.82–1.77)

## 2024-05-20 MED ORDER — LEVOTHYROXINE SODIUM 25 MCG PO TABS: 25.0000 ug | ORAL_TABLET | Freq: Every day | ORAL | 3 refills | Status: DC

## 2024-05-20 MED ORDER — PANTOPRAZOLE SODIUM 40 MG PO TBEC: 40.0000 mg | DELAYED_RELEASE_TABLET | Freq: Two times a day (BID) | ORAL | 3 refills | Status: AC

## 2024-05-20 NOTE — Assessment & Plan Note (Signed)
 Appears stable without exacerbation.  Continue on current regimen

## 2024-05-20 NOTE — Assessment & Plan Note (Signed)
Continue on oxygen to maintain O2 saturations greater than 88 to 90%. 

## 2024-05-20 NOTE — Assessment & Plan Note (Signed)
 Appears stable on current regimen with recent exacerbation.  Last hospitalization March 2024.  We did discuss going forward that we could consider lowering prednisone  very slowly such as 1 mg/month to try to get to 5 mg and hold at that dose.  For now patient is doing very well in the past has had difficulty going down the lower doses.  Also could consider adding in Dupixent and/or Ohtuvayre  to manage symptoms and decrease steroid dosing.  For now patient is doing very well we will reevaluate on follow-up visit.  Plan  Patient Instructions  Continue on BREZTRI  2 puffs Twice daily, rinse after use.  Continue on Prednisone  10mg  daily Mucinex  Twice daily  As needed  Cough/congestion  Flutter valve Three times a day  .  Albuterol  Neb or inhaler as needed  Wear Oxygen  2l/m  with activity and At bedtime   to maintain O2 sats >88-90%.  Activity as tolerated.  Aspirations precautions as discussed.  Daily weights Continue on Lasix  as directed.  Continue on CPAP At bedtime with Oxygen  2l/m  and with naps.  Follow up with Dr. Waymond Hailey  Or Aarna Mihalko NP in 3 months.

## 2024-05-20 NOTE — Assessment & Plan Note (Signed)
 Excellent control compliance on nocturnal CPAP.  Patient education given.  Continue on current settings

## 2024-05-20 NOTE — Progress Notes (Signed)
 @Patient  ID: Paul [name], male    DOB: [birth year], 78 y.o.   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  10 mg daily, chronic respiratory failure on oxygen  and sleep apnea on CPAP with oxygen  Prone to recurrent COPD exacerbations and hospitalizations  TEST/EVENTS :  Medical history significant for diastolic heart failure, atrial fibrillation not on anticoagulation therapy due to history of severe bleeding secondary to hematuria, plans and she is in frequent falls Intolerant to Daliresp  COVID-19 infection January 2022 required hospitalization, hospitalization February 2023 for rhinovirus with COPD exacerbation, hospitalized March 2023 and July 2023 for COPD exacerbation, 02/2023 COPD exacerbation.  March 2023 outpatient ABG without hypercarbia unable to qualify for nocturnal BiPAP or NIV PFTs 01/2024 show stable lung function compared to 2017 with FEV1 at 33%, ratio 49, FVC 48%, diffusing capacity 41%. Some decline in DLCO    05/20/2024 Follow up : COPD, O2 RF and OSA Patient presents for a 56-month follow-up.  Patient is followed for severe COPD with emphysema and chronic respiratory failure on oxygen .  Patient says overall he is had a good 3 months.  Continues to be very active goes to the gym at least 3 days a week.  Also has been able to play some golf which makes him very happy.  Last hospital stay was in March 2024 for COPD exacerbation and CHF decompensation.  He remains on Breztri  twice daily.  Patient is on chronic steroids with prednisone  10 mg daily.  He has been on this for many years.  In the past we tried to lower his dose but unfortunately he has an exacerbation on lower doses.  For the most part only uses oxygen  when he exercises or plays golf.  At rest and with light walking in the home does  not need oxygen .  Patient remains on CPAP for sleep apnea.  Says he is doing well on his CPAP.  Wears it every single night.  Feels that he benefits from CPAP.  Recently changed to a new mask and feels it is not leaking as much.  CPAP download shows 100% compliance daily average usage at 9.5 hours.  AHI is 6.8/hour.  On CPAP 9 cm H2O.  Download shows over the last 2 weeks when he changes mask AHI 1-2/hour. Patient has a very supportive local family and church family.  His son has been doing better as he is undergoing chemotherapy for brain cancer.  He also has his grandson who is a missionary in [place] and is currently visiting home for a few weeks.  He denies any increased cough, wheezing,.  No hemoptysis, edema.  Or chest pain.      Allergies  Allergen Reactions   Tape Other (See Comments)    PLEASE USE COBAN WRAP IN LIEU OF "TAPE," as it "pulls off the skin!!   Daliresp  [Roflumilast ] Diarrhea and Nausea And Vomiting   Levaquin  [Levofloxacin ] Palpitations and Other (See Comments)    Made the B/P fluctuate and heart raced   Alendronate Anxiety and Other (See Comments)    Jittery/nervous    Immunization History  Administered Date(s) Administered   Fluad Quad(high Dose 65+) 10/02/2020, 10/09/2021, 09/05/2022, 10/17/2022   Fluad Trivalent(High Dose 65+) 10/30/2023   Influenza Split 09/22/2013, 09/01/2015   Influenza Whole 09/04/2011, 09/21/2012   Influenza, High Dose Seasonal PF 09/02/2016,  09/10/2017, 08/31/2018, 09/24/2018, 10/21/2019   Influenza-Unspecified 08/23/2014   Moderna Covid-19 Vaccine Bivalent Booster 8yrs & up 10/09/2021   Moderna SARS-COV2 Booster Vaccination 07/05/2021   Moderna Sars-Covid-2 Vaccination 01/17/2020, 02/14/2020, 10/06/2020   PNEUMOCOCCAL CONJUGATE-20 12/05/2022   Pfizer(Comirnaty)Fall Seasonal Vaccine 12 years and older 10/31/2022, 10/30/2023   Pneumococcal Conjugate-13 01/11/2014, 12/09/2016   Pneumococcal Polysaccharide-23 08/23/2010, 08/29/2011    Respiratory Syncytial Virus Vaccine,Recomb Aduvanted(Arexvy) 09/23/2022   Tdap 02/01/2019   Zoster Recombinant(Shingrix) 04/04/2014    Past Medical History:  Diagnosis Date   Anemia    Aortic atherosclerosis (HCC)    Aspiration pneumonia due to inhalation of vomitus (HCC) 02/03/2022   Atrial fibrillation (HCC)    Back pain    COPD (chronic obstructive pulmonary disease) (HCC)    COPD exacerbation (HCC) 12/27/2021   COVID-19 12/24/2020   Diabetes mellitus without complication (HCC)    Dizziness 04/08/2022   Elevated PSA    GAD (generalized anxiety disorder)    GERD (gastroesophageal reflux disease)    High cholesterol    Hypertension    Lingular pneumonia 01/05/2021   See cxr 01/06/20 rx with zpak/ cefipime x 5 days and then developed purulent sputum again 01/12/21 so rx levcaquin 500 mg daily x 7 days then return for cxr before more    Lung nodule < 6cm on CT 05/29/2016   Osteoporosis    Oxygen  deficiency    Pneumonia    January 2022   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 Marven [name] with a 20 mm Watchman FLX device   RSV bronchitis 01/27/2022   Vertebral compression fracture (HCC) 05/29/2019   T8 compression fracture noted on CT scan 05/28/2019 inpatient with chronic steroid dependent COPD - rx calcitonin nasal spray rx per PCP     Tobacco History: Social History   Tobacco Use  Smoking Status Former   Current packs/day: 0.00   Average packs/day: 2.0 packs/day for 52.0 years (104.0 ttl pk-yrs)   Types: Cigarettes   Start date: 02/03/1957   Quit date: 02/03/2009   Years since quitting: 15.3  Smokeless Tobacco Never  Tobacco Comments   Counseled to remain smoke free   Counseling given: Not Answered Tobacco comments: Counseled to remain smoke free   Outpatient Medications Prior to Visit  Medication Sig Dispense Refill   Accu-Chek Softclix Lancets lancets Check fasting sugar once daily. 100 each 3   acetaminophen  (TYLENOL )  500 MG tablet Take 1,000 mg by mouth at bedtime as needed (pain).     albuterol  (PROAIR  HFA) 108 (90 Base) MCG/ACT inhaler Inhale 2 puffs into the lungs as needed for wheezing or shortness of breath.     albuterol  (PROVENTIL ) (2.5 MG/3ML) 0.083% nebulizer solution Take 3 mLs (2.5 mg total) by nebulization every 6 (six) hours as needed for shortness of breath or wheezing. 360 mL 5   ALPRAZolam  (XANAX ) 0.5 MG tablet Take 1 tablet (0.5 mg total) by mouth 2 (two) times daily as needed for anxiety. 30 tablet 0   amLODipine  (NORVASC ) 2.5 MG tablet Take 2.5 mg by mouth daily.     Ascorbic Acid  (VITAMIN C ) 1000 MG tablet Take 1,000 mg by mouth daily.     aspirin  EC 81 MG tablet Take 1 tablet (81 mg total) by mouth daily. Swallow whole. 90 tablet 3   atorvastatin  (LIPITOR) 40 MG tablet TAKE 1 TABLET BY MOUTH EVERYDAY AT BEDTIME 90 tablet 1   blood glucose meter kit and supplies KIT Dispense based on  patient and insurance preference. Use up to four times daily as directed. 1 each 0   Blood Glucose Monitoring Suppl DEVI Use to test blood sugar up to twice daily. May substitute to any manufacturer covered by patient's insurance. E11.0 1 each 0   Budeson-Glycopyrrol-Formoterol  (BREZTRI  AEROSPHERE) 160-9-4.8 MCG/ACT AERO Inhale 2 puffs into the lungs 2 (two) times daily. 3 each 4   calcium  carbonate (OS-CAL) 600 MG TABS tablet Take 600 mg by mouth daily.     Cholecalciferol  (VITAMIN D3) 50 MCG (2000 UT) TABS Take 2,000 Units by mouth in the morning.     dapagliflozin  propanediol (FARXIGA ) 10 MG TABS tablet Take 1 tablet (10 mg total) by mouth daily. 90 tablet 4   denosumab  (PROLIA ) 60 MG/ML SOSY injection Inject 60 mg into the skin every 6 (six) months. 180 mL 0   digoxin  (LANOXIN ) 0.125 MG tablet TAKE 1 TABLET BY MOUTH DAILY 90 tablet 2   ferrous sulfate  325 (65 FE) MG tablet Take 325 mg by mouth 2 (two) times daily with a meal.     fluticasone  (FLONASE ) 50 MCG/ACT nasal spray Place 2 sprays into both nostrils  daily as needed for allergies or rhinitis.     furosemide  (LASIX ) 40 MG tablet Take 1 tablet by mouth daily, may take 1 extra tablet daily only as needed for weight gain or lower extremity swelling     Glucose Blood (BLOOD GLUCOSE TEST STRIPS) STRP Use to test blood sugar up to twice daily. May substitute to any manufacturer covered by patient's insurance. E11.0 100 strip 3   Lancet Device MISC Use to test blood sugar up to twice daily. May substitute to any manufacturer covered by patient's insurance. E11.0 1 each 0   Lancets Misc. MISC Use to test blood sugar up to twice daily. May substitute to any manufacturer covered by patient's insurance. E11.0 100 each 3   levothyroxine  (SYNTHROID ) 25 MCG tablet Take 1 tablet (25 mcg total) by mouth daily. 90 tablet 0   loratadine  (CLARITIN ) 10 MG tablet Take 10 mg by mouth in the morning.     Melatonin 12 MG TABS Take 12 mg by mouth at bedtime.     Multiple Vitamin (MULTIVITAMIN WITH MINERALS) TABS tablet Take 1 tablet by mouth daily with breakfast.     OXYGEN  Inhale 2 L/min into the lungs at bedtime.     predniSONE  (DELTASONE ) 10 MG tablet TAKE 1 TABLET (10 MG TOTAL) BY MOUTH DAILY WITH BREAKFAST. 90 tablet 1   Probiotic Product (PROBIOTIC DAILY) CAPS Take 1 capsule by mouth in the morning.     sertraline  (ZOLOFT ) 50 MG tablet TAKE 1 TABLET BY MOUTH EVERYDAY AT BEDTIME 90 tablet 1   pantoprazole  (PROTONIX ) 40 MG tablet TAKE 1 TABLET BY MOUTH TWICE A DAY 180 tablet 3   Facility-Administered Medications Prior to Visit  Medication Dose Route Frequency Provider Last Rate Last Admin   [START ON 10/30/2024] denosumab  (PROLIA ) injection 60 mg  60 mg Subcutaneous Once Cox, Kirsten, MD         Review of Systems:   Constitutional:   No  weight loss, night sweats,  Fevers, chills, +fatigue, or  lassitude.  HEENT:   No headaches,  Difficulty swallowing,  Tooth/dental problems, or  Sore throat,                No sneezing, itching, ear ache, nasal congestion,  post nasal drip,   CV:  No chest pain,  Orthopnea, PND, swelling in lower extremities,  anasarca, dizziness, palpitations, syncope.   GI  No heartburn, indigestion, abdominal pain, nausea, vomiting, diarrhea, change in bowel habits, loss of appetite, bloody stools.   Resp: No chest wall deformity  Skin: no rash or lesions.  GU: no dysuria, change in color of urine, no urgency or frequency.  No flank pain, no hematuria   MS:  No joint pain or swelling.  No decreased range of motion.  No back pain.    Physical Exam  BP (!) 140/48 (BP Location: Left Arm, Patient Position: Sitting, Cuff Size: Normal)   Pulse 74   Temp 97.8 F (36.6 C) (Oral)   Ht 5\' 5"  (1.651 m)   Wt 162 lb (73.5 kg)   SpO2 92%   BMI 26.96 kg/m   GEN: A/Ox3; pleasant , NAD, well nourished    HEENT:  [place]/AT,  , NOSE-clear, THROAT-clear, no lesions, no postnasal drip or exudate noted.   NECK:  Supple w/ fair ROM; no JVD; normal carotid impulses w/o bruits; no thyromegaly or nodules palpated; no lymphadenopathy.    RESP  Clear  P & A; w/o, wheezes/ rales/ or rhonchi. no accessory muscle use, no dullness to percussion  CARD:  RRR, no m/r/g, no peripheral edema, pulses intact, no cyanosis or clubbing.  GI:   Soft & nt; nml bowel sounds; no organomegaly or masses detected.   Musco: Warm bil, no deformities or joint swelling noted.   Neuro: alert, no focal deficits noted.    Skin: Warm, no lesions or rashes    Lab Results:  CBC    Component Value Date/Time   WBC 11.4 (H) 03/29/2024 0816   WBC 12.6 (H) 08/20/2023 1413   RBC 5.25 03/29/2024 0816   RBC 4.74 08/20/2023 1413   HGB 11.6 (L) 03/29/2024 0816   HCT 39.0 03/29/2024 0816   PLT 337 03/29/2024 0816   MCV 74 (L) 03/29/2024 0816   MCH 22.1 (L) 03/29/2024 0816   MCH 24.1 (L) 08/20/2023 1413   MCHC 29.7 (L) 03/29/2024 0816   MCHC 30.8 08/20/2023 1413   RDW 15.3 03/29/2024 0816   LYMPHSABS 2.3 03/29/2024 0816   MONOABS 0.6 05/12/2023 1616    EOSABS 0.0 03/29/2024 0816   BASOSABS 0.1 03/29/2024 0816    BMET    Component Value Date/Time   NA 141 03/29/2024 0816   K 4.2 03/29/2024 0816   CL 98 03/29/2024 0816   CO2 26 03/29/2024 0816   GLUCOSE 115 (H) 03/29/2024 0816   GLUCOSE 129 (H) 08/20/2023 1413   BUN 16 03/29/2024 0816   CREATININE 1.08 03/29/2024 0816   CALCIUM  9.8 03/29/2024 0816   GFRNONAA >60 08/20/2023 1413   GFRAA 107 11/20/2020 1202    BNP    Component Value Date/Time   BNP 100.8 (H) 05/12/2023 1626    ProBNP No results found for: "PROBNP"  Imaging: No results found.  denosumab  (PROLIA ) injection 60 mg     Date Action Dose Route User   04/29/2024 1109 Given 60 mg Subcutaneous (Left Arm) Zerita Hill, CMA          Latest Ref Rng & Units 02/16/2024    8:56 AM 05/29/2016    9:15 AM  PFT Results  FVC-Pre L 1.64  1.92   FVC-Predicted Pre % 48  52   FVC-Post L 1.54    FVC-Predicted Post % 45    Pre FEV1/FVC % % 49  41   Post FEV1/FCV % % 48    FEV1-Pre L 0.80  0.78  FEV1-Predicted Pre % 33  29   FEV1-Post L 0.74    DLCO uncorrected ml/min/mmHg 8.82    DLCO UNC% % 41    DLCO corrected ml/min/mmHg 8.82    DLCO COR %Predicted % 41    DLVA Predicted % 62    TLC L 6.68    TLC % Predicted % 108    RV % Predicted % 181      No results found for: "NITRICOXIDE"      Assessment & Plan:   COPD GOLD  III  Appears stable on current regimen with recent exacerbation.  Last hospitalization March 2024.  We did discuss going forward that we could consider lowering prednisone  very slowly such as 1 mg/month to try to get to 5 mg and hold at that dose.  For now patient is doing very well in the past has had difficulty going down the lower doses.  Also could consider adding in Dupixent and/or Ohtuvayre  to manage symptoms and decrease steroid dosing.  For now patient is doing very well we will reevaluate on follow-up visit.  Plan  Patient Instructions  Continue on BREZTRI  2 puffs Twice daily, rinse  after use.  Continue on Prednisone  10mg  daily Mucinex  Twice daily  As needed  Cough/congestion  Flutter valve Three times a day  .  Albuterol  Neb or inhaler as needed  Wear Oxygen  2l/m  with activity and At bedtime   to maintain O2 sats >88-90%.  Activity as tolerated.  Aspirations precautions as discussed.  Daily weights Continue on Lasix  as directed.  Continue on CPAP At bedtime with Oxygen  2l/m  and with naps.  Follow up with Dr. Waymond Hailey  Or Tawnie Ehresman NP in 3 months.     OSA (obstructive sleep apnea) Excellent control compliance on nocturnal CPAP.  Patient education given.  Continue on current settings  Chronic diastolic CHF (congestive heart failure) (HCC) Appears stable without exacerbation.  Continue on current regimen  Chronic respiratory failure with hypoxia (HCC) Continue on oxygen  to maintain O2 saturations greater than 88 to 90%     Roena Clark, NP 05/20/2024

## 2024-05-20 NOTE — Patient Instructions (Signed)
 Continue on BREZTRI 2 puffs Twice daily, rinse after use.  Continue on Prednisone 10mg  daily Mucinex Twice daily  As needed  Cough/congestion  Flutter valve Three times a day  .  Albuterol Neb or inhaler as needed  Wear Oxygen 2l/m  with activity and At bedtime   to maintain O2 sats >88-90%.  Activity as tolerated.  Aspirations precautions as discussed.  Daily weights Continue on Lasix as directed.  Continue on CPAP At bedtime with Oxygen 2l/m  and with naps.  Follow up with Dr. Sherene Sires  Or Evelynn Hench NP in 3 months.

## 2024-05-21 MED ORDER — LEVOTHYROXINE SODIUM 25 MCG PO TABS: 25.0000 ug | ORAL_TABLET | Freq: Every day | ORAL | 3 refills | Status: AC

## 2024-05-26 DIAGNOSIS — L570 Actinic keratosis: Secondary | ICD-10-CM | POA: Diagnosis not present

## 2024-05-26 DIAGNOSIS — L821 Other seborrheic keratosis: Secondary | ICD-10-CM | POA: Diagnosis not present

## 2024-05-26 DIAGNOSIS — C44529 Squamous cell carcinoma of skin of other part of trunk: Secondary | ICD-10-CM | POA: Diagnosis not present

## 2024-05-26 DIAGNOSIS — C44219 Basal cell carcinoma of skin of left ear and external auricular canal: Secondary | ICD-10-CM | POA: Diagnosis not present

## 2024-05-26 DIAGNOSIS — C44519 Basal cell carcinoma of skin of other part of trunk: Secondary | ICD-10-CM | POA: Diagnosis not present

## 2024-05-26 DIAGNOSIS — L814 Other melanin hyperpigmentation: Secondary | ICD-10-CM | POA: Diagnosis not present

## 2024-05-26 DIAGNOSIS — L578 Other skin changes due to chronic exposure to nonionizing radiation: Secondary | ICD-10-CM | POA: Diagnosis not present

## 2024-06-28 NOTE — Progress Notes (Unsigned)
 Cardiology Office Note:    Date:  06/29/2024   ID:  Paul [name], DOB 1945/12/24, MRN [id number]  PCP:  [name], MD   [facility] Providers Cardiologist:  Oneil Parchment, MD Cardiology APP:  Carlin Delon BROCKS, NP  Electrophysiologist:  OLE ONEIDA HOLTS, MD     Referring MD: [name], MD   Chief Complaint  Patient presents with   Follow-up    History of Present Illness:    Paul [name] is a 79 y.o. male with a hx of COPD (under palliative care), atrial fibrillation (s/p Watchman procedure 08/02/2021), coronary artery disease, hypertension, DM2, mild bilateral carotid artery stenosis, chronic diastolic congestive heart failure, GERD, syncope.   01/26/2023 echo EF 65 to 70%, grade 1 DD, aortic valve sclerosis is present without stenosis 04/09/2022 monitor average heart rate 77 bpm, no episodes of atrial fibrillation, rare PACs and PVCs 08/02/2021 Watchman procedure with left atrial appendage 07/17/2021 coronary CTA calcium  score 1046, 77th percentile 04/03/2020 carotid duplex mild bilateral carotid artery stenosis  Initially referred to cardiology for atrial fibrillation and multivessel coronary atherosclerosis.  He has notoriously been intolerant of beta-blockers, and anticoagulation.  He has had several syncopal episodes related to beta-blockers.  Anticoagulation caused gross hematuria.  He was referred to Dr. HOLTS for a Watchman device and this was placed in August 2022 for PAF, as he was intolerant to anticoagulation and beta-blockers.  01/16/2023 to 01/19/2022 -admitted for COPD exacerbation, discharged with home O2  01/25/2023 to 01/28/2023 -admitted for A-fib RVR, he converted with diltiazem  infusion, plan to follow-up with EP as he has not tolerated anticoagulation or beta-blocker therapy  02/04/2023 to 02/07/2023-admitted for acute on chronic hypoxic respiratory failure, acute on chronic diastolic congestive heart failure.  He was again noted to be in A-fib RVR,  started on digoxin  and carvedilol  and had converted to normal sinus rhythm upon discharge.  He presents today for follow-up accompanied by his wife.  He has been doing well since he was last evaluated in our office, states this is the longest he has been without a hospitalization in some time and he is feeling good.  He likes to stay active, prefers to play golf however the heat has been somewhat prohibitive for this recently.  He has some shortness of breath however this is baseline for him and attributed to his COPD. He denies chest pain, palpitations, dyspnea, pnd, orthopnea, n, v, dizziness, syncope, edema, weight gain, or early satiety.    Past Medical History:  Diagnosis Date   Anemia    Aortic atherosclerosis (HCC)    Aspiration pneumonia due to inhalation of vomitus (HCC) 02/03/2022   Atrial fibrillation (HCC)    Back pain    COPD (chronic obstructive pulmonary disease) (HCC)    COPD exacerbation (HCC) 12/27/2021   COVID-19 12/24/2020   Diabetes mellitus without complication (HCC)    Dizziness 04/08/2022   Elevated PSA    GAD (generalized anxiety disorder)    GERD (gastroesophageal reflux disease)    High cholesterol    Hypertension    Lingular pneumonia 01/05/2021   See cxr 01/06/20 rx with zpak/ cefipime x 5 days and then developed purulent sputum again 01/12/21 so rx levcaquin 500 mg daily x 7 days then return for cxr before more    Lung nodule < 6cm on CT 05/29/2016   Osteoporosis    Oxygen  deficiency    Pneumonia    January 2022   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 Cindie with a 20 mm Watchman FLX device   RSV bronchitis 01/27/2022   Vertebral compression fracture (HCC) 05/29/2019   T8 compression fracture noted on CT scan 05/28/2019 inpatient with chronic steroid dependent COPD - rx calcitonin nasal spray rx per PCP     Past Surgical History:  Procedure Laterality Date   CATARACT EXTRACTION Right    ELBOW SURGERY   07/2022   surgery on olecranon bursa. Dr. Alyse   IR KYPHO EA ADDL LEVEL THORACIC OR LUMBAR  11/28/2021   LEFT ATRIAL APPENDAGE OCCLUSION N/A 08/02/2021   Procedure: LEFT ATRIAL APPENDAGE OCCLUSION;  Surgeon: Wonda Sharper, MD;  Location: [facility] INVASIVE CV LAB;  Service: Cardiovascular;  Laterality: N/A;   SKIN SURGERY     shoulders and chest   TEE WITHOUT CARDIOVERSION N/A 08/02/2021   Procedure: TRANSESOPHAGEAL ECHOCARDIOGRAM (TEE);  Surgeon: Wonda Sharper, MD;  Location: [facility] INVASIVE CV LAB;  Service: Cardiovascular;  Laterality: N/A;   TEE WITHOUT CARDIOVERSION N/A 09/13/2021   Procedure: TRANSESOPHAGEAL ECHOCARDIOGRAM (TEE);  Surgeon: Francyne Headland, MD;  Location: [facility] ENDOSCOPY;  Service: Cardiovascular;  Laterality: N/A;    Current Medications: Current Meds  Medication Sig   Accu-Chek Softclix Lancets lancets Check fasting sugar once daily.   acetaminophen  (TYLENOL ) 500 MG tablet Take 1,000 mg by mouth at bedtime as needed (pain).   albuterol  (PROAIR  HFA) 108 (90 Base) MCG/ACT inhaler Inhale 2 puffs into the lungs as needed for wheezing or shortness of breath.   albuterol  (PROVENTIL ) (2.5 MG/3ML) 0.083% nebulizer solution Take 3 mLs (2.5 mg total) by nebulization every 6 (six) hours as needed for shortness of breath or wheezing.   ALPRAZolam  (XANAX ) 0.5 MG tablet Take 1 tablet (0.5 mg total) by mouth 2 (two) times daily as needed for anxiety.   amLODipine  (NORVASC ) 2.5 MG tablet Take 2.5 mg by mouth daily.   Ascorbic Acid  (VITAMIN C ) 1000 MG tablet Take 1,000 mg by mouth daily.   aspirin  EC 81 MG tablet Take 1 tablet (81 mg total) by mouth daily. Swallow whole.   atorvastatin  (LIPITOR) 40 MG tablet TAKE 1 TABLET BY MOUTH EVERYDAY AT BEDTIME   blood glucose meter kit and supplies KIT Dispense based on patient and insurance preference. Use up to four times daily as directed.   Blood Glucose Monitoring Suppl DEVI Use to test blood sugar up to twice daily. May substitute to any  manufacturer covered by patient's insurance. E11.0   Budeson-Glycopyrrol-Formoterol  (BREZTRI  AEROSPHERE) 160-9-4.8 MCG/ACT AERO Inhale 2 puffs into the lungs 2 (two) times daily.   calcium  carbonate (OS-CAL) 600 MG TABS tablet Take 600 mg by mouth daily.   Cholecalciferol  (VITAMIN D3) 50 MCG (2000 UT) TABS Take 2,000 Units by mouth in the morning.   dapagliflozin  propanediol (FARXIGA ) 10 MG TABS tablet Take 1 tablet (10 mg total) by mouth daily.   denosumab  (PROLIA ) 60 MG/ML SOSY injection Inject 60 mg into the skin every 6 (six) months.   digoxin  (LANOXIN ) 0.125 MG tablet TAKE 1 TABLET BY MOUTH DAILY   ferrous sulfate  325 (65 FE) MG tablet Take 325 mg by mouth 2 (two) times daily with a meal.   fluticasone  (FLONASE ) 50 MCG/ACT nasal spray Place 2 sprays into both nostrils daily as needed for allergies or rhinitis.   furosemide  (LASIX ) 40 MG tablet Take 1 tablet by mouth daily, may take 1 extra tablet daily only as needed for weight gain or lower extremity swelling   Glucose Blood (BLOOD GLUCOSE TEST STRIPS) STRP Use  to test blood sugar up to twice daily. May substitute to any manufacturer covered by patient's insurance. E11.0   Lancet Device MISC Use to test blood sugar up to twice daily. May substitute to any manufacturer covered by patient's insurance. E11.0   Lancets Misc. MISC Use to test blood sugar up to twice daily. May substitute to any manufacturer covered by patient's insurance. E11.0   levothyroxine  (SYNTHROID ) 25 MCG tablet Take 1 tablet (25 mcg total) by mouth daily.   loratadine  (CLARITIN ) 10 MG tablet Take 10 mg by mouth in the morning.   Melatonin 12 MG TABS Take 12 mg by mouth at bedtime.   Multiple Vitamin (MULTIVITAMIN WITH MINERALS) TABS tablet Take 1 tablet by mouth daily with breakfast.   OXYGEN  Inhale 2 L/min into the lungs at bedtime.   pantoprazole  (PROTONIX ) 40 MG tablet Take 1 tablet (40 mg total) by mouth 2 (two) times daily.   predniSONE  (DELTASONE ) 10 MG tablet TAKE  1 TABLET (10 MG TOTAL) BY MOUTH DAILY WITH BREAKFAST.   Probiotic Product (PROBIOTIC DAILY) CAPS Take 1 capsule by mouth in the morning.   sertraline  (ZOLOFT ) 50 MG tablet TAKE 1 TABLET BY MOUTH EVERYDAY AT BEDTIME   Current Facility-Administered Medications for the 06/29/24 encounter (Office Visit) with Carlin Delon BROCKS, NP  Medication   [START ON 10/30/2024] denosumab  (PROLIA ) injection 60 mg     Allergies:   Tape, Daliresp  [roflumilast ], Levaquin  [levofloxacin ], and Alendronate   Social History   Socioeconomic History   Marital status: Married    Spouse name: Not on file   Number of children: 3   Years of education: Not on file   Highest education level: 10th grade  Occupational History   Occupation: retired    Comment: truck Hospital doctor  Tobacco Use   Smoking status: Former    Current packs/day: 0.00    Average packs/day: 2.0 packs/day for 52.0 years (104.0 ttl pk-yrs)    Types: Cigarettes    Start date: 02/03/1957    Quit date: 02/03/2009    Years since quitting: 15.4   Smokeless tobacco: Never   Tobacco comments:    Counseled to remain smoke free  Vaping Use   Vaping status: Never Used  Substance and Sexual Activity   Alcohol use: Not Currently    Alcohol/week: 0.0 standard drinks of alcohol    Comment: occ   Drug use: No   Sexual activity: Not on file  Other Topics Concern   Not on file  Social History Narrative   Originally from [place]. Previously worked driving a Surveyor, minerals. No pets currently. No mold exposure. 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       wears sunscreen, brushes and flosses daily, see's dentist bi-annually, has smoke/carbon monoxide detectors, wears a seatbelt and practices gun safety      Social Drivers of Health   Financial Resource Strain: Low Risk  (07/19/2023)   Overall Financial Resource Strain (CARDIA)    Difficulty of Paying Living Expenses: Not hard at all  Food  Insecurity: No Food Insecurity (07/19/2023)   Hunger Vital Sign    Worried About Running Out of Food in the Last Year: Never true    Ran Out of Food in the Last Year: Never true  Transportation Needs: No Transportation Needs (07/19/2023)   PRAPARE - Administrator, Civil Service (Medical): No    Lack of Transportation (Non-Medical): No  Physical  Activity: Insufficiently Active (07/19/2023)   Exercise Vital Sign    Days of Exercise per Week: 3 days    Minutes of Exercise per Session: 30 min  Stress: No Stress Concern Present (07/19/2023)   [employer] of Occupational Health - Occupational Stress Questionnaire    Feeling of Stress : Not at all  Social Connections: Socially Integrated (07/19/2023)   Social Connection and Isolation Panel    Frequency of Communication with Friends and Family: More than three times a week    Frequency of Social Gatherings with Friends and Family: More than three times a week    Attends Religious Services: More than 4 times per year    Active Member of [employer] or Organizations: Yes    Attends Engineer, structural: More than 4 times per year    Marital Status: Married     Family History: The patient's family history includes Cancer in his paternal uncle; Heart disease in his brother, father, and mother.  ROS:   Please see the history of present illness.    All other systems reviewed and are negative.  EKGs/Labs/Other Studies Reviewed:    The following studies were reviewed today:  EKG:  EKG is not ordered today.    Recent Labs: 03/29/2024: ALT 40; BUN 16; Creatinine, Ser 1.08; Hemoglobin 11.6; Platelets 337; Potassium 4.2; Sodium 141 05/19/2024: TSH 3.790  Recent Lipid Panel    Component Value Date/Time   CHOL 162 03/29/2024 0816   TRIG 128 03/29/2024 0816   HDL 61 03/29/2024 0816   CHOLHDL 2.7 03/29/2024 0816   LDLCALC 79 03/29/2024 0816     Risk Assessment/Calculations:    CHA2DS2-VASc Score =     This indicates a  %  annual risk of stroke. The patient's score is based upon:                 Physical Exam:    VS:  BP 138/70   Pulse 75   Ht 5' 5 (1.651 m)   Wt 162 lb (73.5 kg)   SpO2 91%   BMI 26.96 kg/m     Wt Readings from Last 3 Encounters:  06/29/24 162 lb (73.5 kg)  05/20/24 162 lb (73.5 kg)  04/01/24 160 lb (72.6 kg)     GEN:  Ill appearing, no acute distress HEENT: Normal NECK: No JVD; No carotid bruits LYMPHATICS: No lymphadenopathy CARDIAC: RRR, no murmurs, rubs, gallops RESPIRATORY:  Clear to auscultation but diminished, without rales, wheezing or rhonchi  ABDOMEN: Soft, non-tender, non-distended MUSCULOSKELETAL:  No edema; No deformity  SKIN: Warm and dry, multiple skin tea NEUROLOGIC:  Alert and oriented x 3 PSYCHIATRIC:  Normal affect   ASSESSMENT:    1. Paroxysmal atrial fibrillation (HCC)   2. Essential hypertension   3. Presence of Watchman left atrial appendage closure device   4. Chronic diastolic heart failure (HCC)   5. Coronary artery calcification     PLAN:    In order of problems listed above:  Paroxysmal atrial fibrillation -he is maintaining sinus rhythm, regular rate and s/p Watchman in 2022 was noted to be in correct placement with no leaking on TEE in September 2022.  Traditionally has been intolerant of beta-blocker secondary to syncopal episodes and falls.  Continue digoxin , he had already taken his morning dose so we are unable to check his digoxin  level today but he is not exhibiting any signs of toxicity.  Chronic diastolic heart failure -echo on 01/26/2023 shows EF 65 to 70%, grade 1  DD.  Euvolemic, continue Farxiga  10 mg daily,  Lasix  40 mg as needed, has not been able to tolerate GDMT secondary to dizziness/orthostasis and syncope. Coronary artery calcification - Stable with no anginal symptoms. No indication for ischemic evaluation.  Continue aspirin . Heart healthy diet and regular cardiovascular exercise encouraged.   Hypertension -blood  pressure today slightly elevated 138/70 however has been bothered by episodes of dizziness and syncope in the past.  For now we will not make any medication changes.  Continue Norvasc  2.5 mg daily, if his blood pressure were to get much higher we could increase his Norvasc  to 5 mg daily. COPD - at baseline, follows with Dr. Darlean.    Disposition-follow-up in 6 months.   Medication Adjustments/Labs and Tests Ordered: Current medicines are reviewed at length with the patient today.  Concerns regarding medicines are outlined above.  Orders Placed This Encounter  Procedures   EKG 12-Lead   No orders of the defined types were placed in this encounter.   Patient Instructions  Medication Instructions:  Your physician recommends that you continue on your current medications as directed. Please refer to the Current Medication list given to you today.  *If you need a refill on your cardiac medications before your next appointment, please call your pharmacy*  Lab Work: NONE If you have labs (blood work) drawn today and your tests are completely normal, you will receive your results only by: MyChart Message (if you have MyChart) OR A paper copy in the mail If you have any lab test that is abnormal or we need to change your treatment, we will call you to review the results.  Testing/Procedures: NONE  Follow-Up: At [facility], you and your health needs are our priority.  As part of our continuing mission to provide you with exceptional heart care, our providers are all part of one team.  This team includes your primary Cardiologist (physician) and Advanced Practice Providers or APPs (Physician Assistants and Nurse Practitioners) who all work together to provide you with the care you need, when you need it.  Your next appointment:   6 month(s)  Provider:   Delon Hoover, NP [place])    We recommend signing up for the patient portal called MyChart.  Sign up information is  provided on this After Visit Summary.  MyChart is used to connect with patients for Virtual Visits (Telemedicine).  Patients are able to view lab/test results, encounter notes, upcoming appointments, etc.  Non-urgent messages can be sent to your provider as well.   To learn more about what you can do with MyChart, go to [email or web address].   Other Instructions        Signed, Delon JAYSON Hoover, NP  06/29/2024 12:26 PM    [facility]

## 2024-06-29 ENCOUNTER — Ambulatory Visit: Attending: Cardiology | Admitting: Cardiology

## 2024-06-29 ENCOUNTER — Encounter: Payer: Self-pay | Admitting: Cardiology

## 2024-06-29 VITALS — BP 138/70 | HR 75 | Ht 65.0 in | Wt 162.0 lb

## 2024-06-29 DIAGNOSIS — I5032 Chronic diastolic (congestive) heart failure: Secondary | ICD-10-CM | POA: Diagnosis present

## 2024-06-29 DIAGNOSIS — I480 Paroxysmal atrial fibrillation: Secondary | ICD-10-CM | POA: Diagnosis present

## 2024-06-29 DIAGNOSIS — I2510 Atherosclerotic heart disease of native coronary artery without angina pectoris: Secondary | ICD-10-CM | POA: Diagnosis present

## 2024-06-29 DIAGNOSIS — I10 Essential (primary) hypertension: Secondary | ICD-10-CM

## 2024-06-29 DIAGNOSIS — Z95818 Presence of other cardiac implants and grafts: Secondary | ICD-10-CM | POA: Diagnosis present

## 2024-06-29 NOTE — Patient Instructions (Signed)
 Medication Instructions:  Your physician recommends that you continue on your current medications as directed. Please refer to the Current Medication list given to you today.  *If you need a refill on your cardiac medications before your next appointment, please call your pharmacy*  Lab Work: NONE If you have labs (blood work) drawn today and your tests are completely normal, you will receive your results only by: MyChart Message (if you have MyChart) OR A paper copy in the mail If you have any lab test that is abnormal or we need to change your treatment, we will call you to review the results.  Testing/Procedures: NONE  Follow-Up: At [facility], you and your health needs are our priority.  As part of our continuing mission to provide you with exceptional heart care, our providers are all part of one team.  This team includes your primary Cardiologist (physician) and Advanced Practice Providers or APPs (Physician Assistants and Nurse Practitioners) who all work together to provide you with the care you need, when you need it.  Your next appointment:   6 month(s)  Provider:   Pattricia Bores, NP [place]ana Kindler)    We recommend signing up for the patient portal called "MyChart".  Sign up information is provided on this After Visit Summary.  MyChart is used to connect with patients for Virtual Visits (Telemedicine).  Patients are able to view lab/test results, encounter notes, upcoming appointments, etc.  Non-urgent messages can be sent to your provider as well.   To learn more about what you can do with MyChart, go to [email or web address].   Other Instructions

## 2024-06-30 ENCOUNTER — Other Ambulatory Visit: Payer: Self-pay

## 2024-06-30 DIAGNOSIS — E782 Mixed hyperlipidemia: Secondary | ICD-10-CM

## 2024-06-30 DIAGNOSIS — I152 Hypertension secondary to endocrine disorders: Secondary | ICD-10-CM

## 2024-06-30 DIAGNOSIS — D649 Anemia, unspecified: Secondary | ICD-10-CM

## 2024-06-30 DIAGNOSIS — E039 Hypothyroidism, unspecified: Secondary | ICD-10-CM

## 2024-06-30 DIAGNOSIS — E1169 Type 2 diabetes mellitus with other specified complication: Secondary | ICD-10-CM

## 2024-07-05 ENCOUNTER — Other Ambulatory Visit

## 2024-07-05 DIAGNOSIS — E782 Mixed hyperlipidemia: Secondary | ICD-10-CM

## 2024-07-05 DIAGNOSIS — D649 Anemia, unspecified: Secondary | ICD-10-CM

## 2024-07-05 DIAGNOSIS — E1159 Type 2 diabetes mellitus with other circulatory complications: Secondary | ICD-10-CM | POA: Diagnosis not present

## 2024-07-05 DIAGNOSIS — E1169 Type 2 diabetes mellitus with other specified complication: Secondary | ICD-10-CM | POA: Diagnosis not present

## 2024-07-05 DIAGNOSIS — I152 Hypertension secondary to endocrine disorders: Secondary | ICD-10-CM | POA: Diagnosis not present

## 2024-07-06 ENCOUNTER — Ambulatory Visit: Payer: Self-pay | Admitting: Family Medicine

## 2024-07-06 ENCOUNTER — Other Ambulatory Visit: Payer: Self-pay

## 2024-07-06 DIAGNOSIS — E1169 Type 2 diabetes mellitus with other specified complication: Secondary | ICD-10-CM

## 2024-07-06 LAB — LIPID PANEL
Chol/HDL Ratio: 2.8 ratio (ref 0.0–5.0)
Cholesterol, Total: 158 mg/dL (ref 100–199)
HDL: 57 mg/dL (ref >39)
LDL Chol Calc (NIH): 68 mg/dL (ref 0–99)
Triglycerides: 199 mg/dL — ABNORMAL HIGH (ref 0–149)
VLDL Cholesterol Cal: 33 mg/dL (ref 5–40)

## 2024-07-06 LAB — COMPREHENSIVE METABOLIC PANEL WITH GFR
ALT: 36 IU/L (ref 0–44)
AST: 28 IU/L (ref 0–40)
Albumin: 4.2 g/dL (ref 3.8–4.8)
Alkaline Phosphatase: 91 IU/L (ref 44–121)
BUN/Creatinine Ratio: 20 (ref 10–24)
BUN: 21 mg/dL (ref 8–27)
Bilirubin Total: <0.2 mg/dL (ref 0.0–1.2)
CO2: 22 mmol/L (ref 20–29)
Calcium: 9.4 mg/dL (ref 8.6–10.2)
Chloride: 101 mmol/L (ref 96–106)
Creatinine, Ser: 1.03 mg/dL (ref 0.76–1.27)
Globulin, Total: 2.1 g/dL (ref 1.5–4.5)
Glucose: 94 mg/dL (ref 70–99)
Potassium: 4.4 mmol/L (ref 3.5–5.2)
Sodium: 142 mmol/L (ref 134–144)
Total Protein: 6.3 g/dL (ref 6.0–8.5)
eGFR: 74 mL/min/1.73 (ref >59)

## 2024-07-06 LAB — CBC WITH DIFFERENTIAL/PLATELET
Basophils Absolute: 0.1 x10E3/uL (ref 0.0–0.2)
Basos: 1 %
EOS (ABSOLUTE): 0.2 x10E3/uL (ref 0.0–0.4)
Eos: 2 %
Hematocrit: 39.9 % (ref 37.5–51.0)
Hemoglobin: 11.3 g/dL — ABNORMAL LOW (ref 13.0–17.7)
Immature Grans (Abs): 0 x10E3/uL (ref 0.0–0.1)
Immature Granulocytes: 0 %
Lymphocytes Absolute: 3.1 x10E3/uL (ref 0.7–3.1)
Lymphs: 28 %
MCH: 21 pg — ABNORMAL LOW (ref 26.6–33.0)
MCHC: 28.3 g/dL — ABNORMAL LOW (ref 31.5–35.7)
MCV: 74 fL — ABNORMAL LOW (ref 79–97)
Monocytes Absolute: 1 x10E3/uL — ABNORMAL HIGH (ref 0.1–0.9)
Monocytes: 9 %
Neutrophils Absolute: 6.9 x10E3/uL (ref 1.4–7.0)
Neutrophils: 60 %
Platelets: 300 x10E3/uL (ref 150–450)
RBC: 5.39 x10E6/uL (ref 4.14–5.80)
RDW: 15.2 % (ref 11.6–15.4)
WBC: 11.3 x10E3/uL — ABNORMAL HIGH (ref 3.4–10.8)

## 2024-07-06 LAB — IRON,TIBC AND FERRITIN PANEL
Ferritin: 26 ng/mL — ABNORMAL LOW (ref 30–400)
Iron Saturation: 11 % — ABNORMAL LOW (ref 15–55)
Iron: 40 ug/dL (ref 38–169)
Total Iron Binding Capacity: 361 ug/dL (ref 250–450)
UIBC: 321 ug/dL (ref 111–343)

## 2024-07-06 LAB — HEMOGLOBIN A1C
Est. average glucose Bld gHb Est-mCnc: 134 mg/dL
Hgb A1c MFr Bld: 6.3 % — ABNORMAL HIGH (ref 4.8–5.6)

## 2024-07-06 MED ORDER — DAPAGLIFLOZIN PROPANEDIOL 10 MG PO TABS: 10.0000 mg | ORAL_TABLET | Freq: Every day | ORAL | 4 refills | Status: AC

## 2024-07-06 MED ORDER — BREZTRI AEROSPHERE 160-9-4.8 MCG/ACT IN AERO: 2.0000 | INHALATION_SPRAY | Freq: Two times a day (BID) | RESPIRATORY_TRACT | 4 refills | Status: DC

## 2024-07-06 NOTE — Progress Notes (Unsigned)
 Request received from [facility] for patient assistance refill

## 2024-07-07 NOTE — Progress Notes (Signed)
 Subjective:  Patient ID: Paul [name], male    DOB: 1946/03/01  Age: 78 y.o. MRN: [id number]  Chief Complaint  Patient presents with   Medical Management of Chronic Issues    HPI: Diabetes:  Complications: none Glucose checking: not daily  Hypoglycemia: no  Most recent A1C: 6.3 Current medications:  Farxiga  10 mg daily Last Eye Exam: UTD Foot checks: daily   Hyperlipidemia: Current medications: on lipitor 40 mg once daily and just increased fish oil one twice a day.    Hypertension/Atrial fibrillation: Current medications: Norvasc  2.5 mg daily, Lasix  40 mg DAILY.   COPD/Chronic respiratory failure with hypoxia: ON breztri  2 puffs twice daily. Prednisone  10 mg daily. ALBUTEROL  NEBULIZERS as needed. On home oxygen  2 L at night..   Mild obstructive sleep apnea with AHI at 13.9 and SpO2 low at 93%.  Required 2 L of oxygen  with Nocturnal CPAP at 9 cm H2O.    GERD: on protonix  40 mg twice daily.   GAD: on zoloft  50 mg once daily. Denies depression or anxiety. Has alprazolam  once daily as needed. Very sparingly.    Osteoporosis: on prolia  every 6 months and vitamin d  2000 U daily   Diet: Fairly healthy. Exercising 3 times per week     04/01/2024   10:35 AM 05/14/2023   11:23 AM 04/01/2023   11:15 AM 03/26/2023    8:54 AM 11/22/2022    9:35 AM  Depression screen PHQ 2/9  Decreased Interest 0 0 0 0 0  Down, Depressed, Hopeless 0 0 0 0 0  PHQ - 2 Score 0 0 0 0 0        05/20/2024    8:57 AM  Fall Risk   Falls in the past year? 0    Patient Care Team: [name], MD as PCP - General (Family Medicine) Jeffrie Oneil BROCKS, MD as PCP - Cardiology (Cardiology) Cindie Ole DASEN, MD as PCP - Electrophysiology (Cardiology) Brien Belvie FORBES, MD as Attending Physician (Pulmonary Disease) Darlean Ozell NOVAK, MD as Consulting Physician (Pulmonary Disease) [facility] as Registered Nurse [facility]) Carlin Delon BROCKS, NP as Nurse Practitioner  (Cardiology)   Review of Systems  Constitutional:  Negative for chills, fatigue and fever.  HENT:  Negative for congestion, ear pain and sore throat.   Respiratory:  Positive for shortness of breath. Negative for cough.   Cardiovascular:  Negative for chest pain.  Gastrointestinal:  Negative for abdominal pain, constipation, diarrhea, nausea and vomiting.  Endocrine: Negative for polydipsia, polyphagia and polyuria.  Genitourinary:  Negative for dysuria and frequency.  Musculoskeletal:  Negative for arthralgias and myalgias.  Neurological:  Negative for dizziness and headaches.  Psychiatric/Behavioral:  Negative for dysphoric mood.        No dysphoria    Current Outpatient Medications on File Prior to Visit  Medication Sig Dispense Refill   Accu-Chek Softclix Lancets lancets Check fasting sugar once daily. 100 each 3   acetaminophen  (TYLENOL ) 500 MG tablet Take 1,000 mg by mouth at bedtime as needed (pain).     albuterol  (PROAIR  HFA) 108 (90 Base) MCG/ACT inhaler Inhale 2 puffs into the lungs as needed for wheezing or shortness of breath.     albuterol  (PROVENTIL ) (2.5 MG/3ML) 0.083% nebulizer solution Take 3 mLs (2.5 mg total) by nebulization every 6 (six) hours as needed for shortness of breath or wheezing. 360 mL 5   ALPRAZolam  (XANAX ) 0.5 MG tablet Take 1 tablet (0.5 mg total) by mouth  2 (two) times daily as needed for anxiety. 30 tablet 0   amLODipine  (NORVASC ) 2.5 MG tablet Take 2.5 mg by mouth daily.     Ascorbic Acid  (VITAMIN C ) 1000 MG tablet Take 1,000 mg by mouth daily.     aspirin  EC 81 MG tablet Take 1 tablet (81 mg total) by mouth daily. Swallow whole. 90 tablet 3   atorvastatin  (LIPITOR) 40 MG tablet TAKE 1 TABLET BY MOUTH EVERYDAY AT BEDTIME 90 tablet 1   blood glucose meter kit and supplies KIT Dispense based on patient and insurance preference. Use up to four times daily as directed. 1 each 0   Blood Glucose Monitoring Suppl DEVI Use to test blood sugar up to twice  daily. May substitute to any manufacturer covered by patient's insurance. E11.0 1 each 0   budesonide -glycopyrrolate-formoterol  (BREZTRI  AEROSPHERE) 160-9-4.8 MCG/ACT AERO inhaler Inhale 2 puffs into the lungs 2 (two) times daily. 3 each 4   calcium  carbonate (OS-CAL) 600 MG TABS tablet Take 600 mg by mouth daily.     Cholecalciferol  (VITAMIN D3) 50 MCG (2000 UT) TABS Take 2,000 Units by mouth in the morning.     dapagliflozin  propanediol (FARXIGA ) 10 MG TABS tablet Take 1 tablet (10 mg total) by mouth daily. 90 tablet 4   denosumab  (PROLIA ) 60 MG/ML SOSY injection Inject 60 mg into the skin every 6 (six) months. 180 mL 0   digoxin  (LANOXIN ) 0.125 MG tablet TAKE 1 TABLET BY MOUTH DAILY 90 tablet 2   ferrous sulfate  325 (65 FE) MG tablet Take 325 mg by mouth 2 (two) times daily with a meal.     fluticasone  (FLONASE ) 50 MCG/ACT nasal spray Place 2 sprays into both nostrils daily as needed for allergies or rhinitis.     furosemide  (LASIX ) 40 MG tablet Take 1 tablet by mouth daily, may take 1 extra tablet daily only as needed for weight gain or lower extremity swelling     Glucose Blood (BLOOD GLUCOSE TEST STRIPS) STRP Use to test blood sugar up to twice daily. May substitute to any manufacturer covered by patient's insurance. E11.0 100 strip 3   Lancet Device MISC Use to test blood sugar up to twice daily. May substitute to any manufacturer covered by patient's insurance. E11.0 1 each 0   Lancets Misc. MISC Use to test blood sugar up to twice daily. May substitute to any manufacturer covered by patient's insurance. E11.0 100 each 3   levothyroxine  (SYNTHROID ) 25 MCG tablet Take 1 tablet (25 mcg total) by mouth daily. 90 tablet 3   loratadine  (CLARITIN ) 10 MG tablet Take 10 mg by mouth in the morning.     Melatonin 12 MG TABS Take 12 mg by mouth at bedtime.     Multiple Vitamin (MULTIVITAMIN WITH MINERALS) TABS tablet Take 1 tablet by mouth daily with breakfast.     OXYGEN  Inhale 2 L/min into the lungs  at bedtime.     pantoprazole  (PROTONIX ) 40 MG tablet Take 1 tablet (40 mg total) by mouth 2 (two) times daily. 180 tablet 3   predniSONE  (DELTASONE ) 10 MG tablet TAKE 1 TABLET (10 MG TOTAL) BY MOUTH DAILY WITH BREAKFAST. 90 tablet 1   Probiotic Product (PROBIOTIC DAILY) CAPS Take 1 capsule by mouth in the morning.     sertraline  (ZOLOFT ) 50 MG tablet TAKE 1 TABLET BY MOUTH EVERYDAY AT BEDTIME 90 tablet 1   Current Facility-Administered Medications on File Prior to Visit  Medication Dose Route Frequency Provider Last Rate Last Admin   [  START ON 10/30/2024] denosumab  (PROLIA ) injection 60 mg  60 mg Subcutaneous Once [name], MD       Past Medical History:  Diagnosis Date   Anemia    Aortic atherosclerosis (HCC)    Aspiration pneumonia due to inhalation of vomitus (HCC) 02/03/2022   Atrial fibrillation (HCC)    Back pain    COPD (chronic obstructive pulmonary disease) (HCC)    COPD exacerbation (HCC) 12/27/2021   COVID-19 12/24/2020   Diabetes mellitus without complication (HCC)    Dizziness 04/08/2022   Elevated PSA    GAD (generalized anxiety disorder)    GERD (gastroesophageal reflux disease)    High cholesterol    Hypertension    Lingular pneumonia 01/05/2021   See cxr 01/06/20 rx with zpak/ cefipime x 5 days and then developed purulent sputum again 01/12/21 so rx levcaquin 500 mg daily x 7 days then return for cxr before more    Lung nodule < 6cm on CT 05/29/2016   Osteoporosis    Oxygen  deficiency    Pneumonia    January 2022   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 Cindie with a 20 mm Watchman FLX device   RSV bronchitis 01/27/2022   Vertebral compression fracture (HCC) 05/29/2019   T8 compression fracture noted on CT scan 05/28/2019 inpatient with chronic steroid dependent COPD - rx calcitonin nasal spray rx per PCP    Past Surgical History:  Procedure Laterality Date   CATARACT EXTRACTION Right    ELBOW SURGERY  07/2022    surgery on olecranon bursa. Dr. Alyse   IR KYPHO EA ADDL LEVEL THORACIC OR LUMBAR  11/28/2021   LEFT ATRIAL APPENDAGE OCCLUSION N/A 08/02/2021   Procedure: LEFT ATRIAL APPENDAGE OCCLUSION;  Surgeon: Wonda Sharper, MD;  Location: [facility] INVASIVE CV LAB;  Service: Cardiovascular;  Laterality: N/A;   SKIN SURGERY     shoulders and chest   TEE WITHOUT CARDIOVERSION N/A 08/02/2021   Procedure: TRANSESOPHAGEAL ECHOCARDIOGRAM (TEE);  Surgeon: Wonda Sharper, MD;  Location: [facility] INVASIVE CV LAB;  Service: Cardiovascular;  Laterality: N/A;   TEE WITHOUT CARDIOVERSION N/A 09/13/2021   Procedure: TRANSESOPHAGEAL ECHOCARDIOGRAM (TEE);  Surgeon: Francyne Headland, MD;  Location: [facility] ENDOSCOPY;  Service: Cardiovascular;  Laterality: N/A;    Family History  Problem Relation Age of Onset   Heart disease Brother    Heart disease Mother    Heart disease Father    Cancer Paternal Uncle    Social History   Socioeconomic History   Marital status: Married    Spouse name: Not on file   Number of children: 3   Years of education: Not on file   Highest education level: 10th grade  Occupational History   Occupation: retired    Comment: truck Hospital doctor  Tobacco Use   Smoking status: Former    Current packs/day: 0.00    Average packs/day: 2.0 packs/day for 52.0 years (104.0 ttl pk-yrs)    Types: Cigarettes    Start date: 02/03/1957    Quit date: 02/03/2009    Years since quitting: 15.4   Smokeless tobacco: Never   Tobacco comments:    Counseled to remain smoke free  Vaping Use   Vaping status: Never Used  Substance and Sexual Activity   Alcohol use: Not Currently    Alcohol/week: 0.0 standard drinks of alcohol    Comment: occ   Drug use: No   Sexual activity: Not on file  Other Topics Concern  Not on file  Social History Narrative   Originally from [place]. Previously worked driving a Surveyor, minerals. No pets currently. No mold exposure. 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       wears sunscreen, brushes and flosses daily, see's dentist bi-annually, has smoke/carbon monoxide detectors, wears a seatbelt and practices gun safety      Social Drivers of Health   Financial Resource Strain: Low Risk  (07/19/2023)   Overall Financial Resource Strain (CARDIA)    Difficulty of Paying Living Expenses: Not hard at all  Food Insecurity: No Food Insecurity (07/08/2024)   Hunger Vital Sign    Worried About Running Out of Food in the Last Year: Never true    Ran Out of Food in the Last Year: Never true  Transportation Needs: No Transportation Needs (07/08/2024)   PRAPARE - Administrator, Civil Service (Medical): No    Lack of Transportation (Non-Medical): No  Physical Activity: Insufficiently Active (07/19/2023)   Exercise Vital Sign    Days of Exercise per Week: 3 days    Minutes of Exercise per Session: 30 min  Stress: No Stress Concern Present (07/19/2023)   [employer] of Occupational Health - Occupational Stress Questionnaire    Feeling of Stress : Not at all  Social Connections: Socially Integrated (07/19/2023)   Social Connection and Isolation Panel    Frequency of Communication with Friends and Family: More than three times a week    Frequency of Social Gatherings with Friends and Family: More than three times a week    Attends Religious Services: More than 4 times per year    Active Member of [employer] or Organizations: Yes    Attends Engineer, structural: More than 4 times per year    Marital Status: Married    Objective:  BP 134/82   Pulse 91   Temp 98.2 F (36.8 C)   Ht 5' 5 (1.651 m)   Wt 159 lb (72.1 kg)   SpO2 96%   BMI 26.46 kg/m      07/08/2024    8:22 AM 06/29/2024   10:50 AM 06/29/2024   10:18 AM  BP/Weight  Systolic BP 134 138 160  Diastolic BP 82 70 60  Wt. (Lbs) 159  162  BMI 26.46 kg/m2  26.96 kg/m2    Physical Exam Vitals reviewed.  Constitutional:      Appearance:  Normal appearance.  Neck:     Vascular: No carotid bruit.  Cardiovascular:     Rate and Rhythm: Normal rate and regular rhythm.     Pulses: Normal pulses.     Heart sounds: Normal heart sounds.  Pulmonary:     Effort: Pulmonary effort is normal.     Breath sounds: Normal breath sounds. No wheezing, rhonchi or rales.  Abdominal:     General: Bowel sounds are normal.     Palpations: Abdomen is soft.     Tenderness: There is no abdominal tenderness.  Neurological:     Mental Status: He is alert.  Psychiatric:        Mood and Affect: Mood normal.        Behavior: Behavior normal.      Diabetic foot exam was performed with the following findings:   No deformities, ulcerations, or other skin breakdown Normal sensation of 10g monofilament Intact posterior tibialis and dorsalis pedis pulses      Lab Results  Component Value Date   WBC 11.3 (H) 07/05/2024   HGB 11.3 (L) 07/05/2024   HCT 39.9 07/05/2024   PLT 300 07/05/2024   GLUCOSE 94 07/05/2024   CHOL 158 07/05/2024   TRIG 199 (H) 07/05/2024   HDL 57 07/05/2024   LDLCALC 68 07/05/2024   ALT 36 07/05/2024   AST 28 07/05/2024   NA 142 07/05/2024   K 4.4 07/05/2024   CL 101 07/05/2024   CREATININE 1.03 07/05/2024   BUN 21 07/05/2024   CO2 22 07/05/2024   TSH 3.790 05/19/2024   INR 0.9 01/25/2023   HGBA1C 6.3 (H) 07/05/2024   MICROALBUR Negative 10/09/2021      Assessment & Plan:  Other iron deficiency anemia Assessment & Plan: Check blood count.   Orders: -     Ambulatory referral to Hematology / Oncology  COPD GOLD  III  Assessment & Plan: The current medical regimen is effective;  continue present plan and medications. ON breztri  2 puffs twice daily. Prednisone  10 mg daily. ALBUTEROL  NEBULIZERS as needed. On home oxygen  2 L at night..     Atherosclerosis of aorta (HCC) Assessment & Plan: Stable.  Continue lipitor 40 mg before bed, fish oil one twice daily, and aspirin  81 mg once daily.    Mixed  hyperlipidemia Assessment & Plan: Not quite at goal. No changes to medicines. Continue lipitor 40 mg before bed and increase fish oil one twice daily. .  Continue to work on eating a healthy diet. Labs reviewed.   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HCC) Assessment & Plan: Diabetes and cholesterol at goal Recommend check feet daily. Recommend annual eye exams. Medicines: Farxiga  10 mg daily and lipitor 40 mg before bed.  Continue to work on eating a healthy diet.  Labs reviewed   Age-related osteoporosis with current pathological fracture with routine healing, subsequent encounter Assessment & Plan: Continue prolia  60 mg every 6 months.    Paroxysmal atrial fibrillation [facility]) Assessment & Plan: Patient s/p Watchman with paroxysmal atrial fibrillation Continue Digoxin  0.125mg  QD.  Management per specialist.    Presence of heart assist device [facility]) Assessment & Plan: Management per specialist.     GAD (generalized anxiety disorder) Assessment & Plan: The current medical regimen is effective;  continue present plan and medications. Continue on zoloft  50 mg once daily.   Chronic respiratory failure with hypoxia (HCC) Assessment & Plan: On oxygen  2 L continuous at night.       No orders of the defined types were placed in this encounter.   Orders Placed This Encounter  Procedures   Ambulatory referral to Hematology / Oncology     Follow-up: Return in about 6 months (around 01/08/2025).   I,Marla I Leal-Borjas,acting as a scribe for [name] Free, MD.,have documented all relevant documentation on the behalf of [name] Free, MD,as directed by  [name] Free, MD while in the presence of [name] Free, MD.   An After Visit Summary was printed and given to the patient.  I attest that I have reviewed this visit and agree with the plan scribed by my staff.   [name] Free, MD [facility] [phone]

## 2024-07-08 ENCOUNTER — Ambulatory Visit (INDEPENDENT_AMBULATORY_CARE_PROVIDER_SITE_OTHER): Admitting: Family Medicine

## 2024-07-08 ENCOUNTER — Encounter: Payer: Self-pay | Admitting: Family Medicine

## 2024-07-08 VITALS — BP 134/82 | HR 91 | Temp 98.2°F | Ht 65.0 in | Wt 159.0 lb

## 2024-07-08 DIAGNOSIS — Z9981 Dependence on supplemental oxygen: Secondary | ICD-10-CM

## 2024-07-08 DIAGNOSIS — M8000XD Age-related osteoporosis with current pathological fracture, unspecified site, subsequent encounter for fracture with routine healing: Secondary | ICD-10-CM

## 2024-07-08 DIAGNOSIS — J449 Chronic obstructive pulmonary disease, unspecified: Secondary | ICD-10-CM

## 2024-07-08 DIAGNOSIS — I480 Paroxysmal atrial fibrillation: Secondary | ICD-10-CM | POA: Diagnosis not present

## 2024-07-08 DIAGNOSIS — Z95811 Presence of heart assist device: Secondary | ICD-10-CM | POA: Diagnosis not present

## 2024-07-08 DIAGNOSIS — E1169 Type 2 diabetes mellitus with other specified complication: Secondary | ICD-10-CM | POA: Diagnosis not present

## 2024-07-08 DIAGNOSIS — F411 Generalized anxiety disorder: Secondary | ICD-10-CM | POA: Diagnosis not present

## 2024-07-08 DIAGNOSIS — D508 Other iron deficiency anemias: Secondary | ICD-10-CM

## 2024-07-08 DIAGNOSIS — I700 Atherosclerosis of aorta: Secondary | ICD-10-CM | POA: Diagnosis not present

## 2024-07-08 DIAGNOSIS — E782 Mixed hyperlipidemia: Secondary | ICD-10-CM

## 2024-07-08 DIAGNOSIS — J9611 Chronic respiratory failure with hypoxia: Secondary | ICD-10-CM | POA: Diagnosis not present

## 2024-07-08 DIAGNOSIS — D649 Anemia, unspecified: Secondary | ICD-10-CM

## 2024-07-10 NOTE — Assessment & Plan Note (Signed)
 The current medical regimen is effective;  continue present plan and medications. ON breztri  2 puffs twice daily. Prednisone  10 mg daily. ALBUTEROL  NEBULIZERS as needed. On home oxygen  2 L at night.[name]

## 2024-07-11 NOTE — Assessment & Plan Note (Signed)
Continue prolia 60 mg every 6 months.

## 2024-07-11 NOTE — Assessment & Plan Note (Signed)
On 2 L oxygen. ?

## 2024-07-11 NOTE — Assessment & Plan Note (Signed)
 Patient s/p Watchman with paroxysmal atrial fibrillation Continue Digoxin  0.125mg  QD.  Management per specialist.

## 2024-07-11 NOTE — Assessment & Plan Note (Signed)
 Not quite at goal. No changes to medicines. Continue lipitor 40 mg before bed and increase fish oil one twice daily. .  Continue to work on eating a healthy diet. Labs reviewed.

## 2024-07-11 NOTE — Assessment & Plan Note (Signed)
 The current medical regimen is effective;  continue present plan and medications. Continue on zoloft  50 mg once daily.

## 2024-07-11 NOTE — Assessment & Plan Note (Signed)
 Stable.  Continue lipitor 40 mg before bed, fish oil one twice daily, and aspirin  81 mg once daily.

## 2024-07-11 NOTE — Progress Notes (Incomplete)
 Subjective:  Patient ID: Paul [name], male    DOB: July 24, 1946  Age: 78 y.o. MRN: [id number]  Chief Complaint  Patient presents with  . Medical Management of Chronic Issues    HPI:  Diabetes:  Complications: none Glucose checking: not daily  Hypoglycemia: no  Most recent A1C: 6.3 Current medications:  Farxiga  10 mg daily Last Eye Exam: UTD Foot checks: daily   Hyperlipidemia: Current medications: on lipitor 40 mg once daily and just increased fish oil one twice a day.    Hypertension/Atrial fibrillation: Current medications: Norvasc  2.5 mg daily, Lasix  40 mg DAILY.   COPD/Chronic respiratory failure with hypoxia: ON breztri  2 puffs twice daily. Prednisone  10 mg daily. ALBUTEROL  NEBULIZERS as needed. On home oxygen  2 L at night..   Mild obstructive sleep apnea with AHI at 13.9 and SpO2 low at 93%.  Required 2 L of oxygen  with Nocturnal CPAP at 9 cm H2O.    GERD: on protonix  40 mg twice daily.   GAD: on zoloft  50 mg once daily. Denies depression or anxiety. Has alprazolam  once daily as needed. Very sparingly.    Osteoporosis: on prolia  every 6 months and vitamin d  2000 U daily   Diet: Fairly healthy. Exercising 3 times per week     04/01/2024   10:35 AM 05/14/2023   11:23 AM 04/01/2023   11:15 AM 03/26/2023    8:54 AM 11/22/2022    9:35 AM  Depression screen PHQ 2/9  Decreased Interest 0 0 0 0 0  Down, Depressed, Hopeless 0 0 0 0 0  PHQ - 2 Score 0 0 0 0 0        05/20/2024    8:57 AM  Fall Risk   Falls in the past year? 0    Patient Care Team: [name], MD as PCP - General (Family Medicine) Jeffrie Oneil BROCKS, MD as PCP - Cardiology (Cardiology) Cindie Ole DASEN, MD as PCP - Electrophysiology (Cardiology) Brien Belvie FORBES, MD as Attending Physician (Pulmonary Disease) Darlean Ozell NOVAK, MD as Consulting Physician (Pulmonary Disease) [facility] as Registered Nurse [facility] and Palliative Medicine) Carlin Delon BROCKS, NP as Nurse Practitioner  (Cardiology)   Review of Systems  Constitutional:  Negative for chills, fatigue and fever.  HENT:  Negative for congestion, ear pain and sore throat.   Respiratory:  Positive for shortness of breath. Negative for cough.   Cardiovascular:  Negative for chest pain.  Gastrointestinal:  Negative for abdominal pain, constipation, diarrhea, nausea and vomiting.  Endocrine: Negative for polydipsia, polyphagia and polyuria.  Genitourinary:  Negative for dysuria and frequency.  Musculoskeletal:  Negative for arthralgias and myalgias.  Neurological:  Negative for dizziness and headaches.  Psychiatric/Behavioral:  Negative for dysphoric mood.        No dysphoria    Current Outpatient Medications on File Prior to Visit  Medication Sig Dispense Refill  . Accu-Chek Softclix Lancets lancets Check fasting sugar once daily. 100 each 3  . acetaminophen  (TYLENOL ) 500 MG tablet Take 1,000 mg by mouth at bedtime as needed (pain).    . albuterol  (PROAIR  HFA) 108 (90 Base) MCG/ACT inhaler Inhale 2 puffs into the lungs as needed for wheezing or shortness of breath.    . albuterol  (PROVENTIL ) (2.5 MG/3ML) 0.083% nebulizer solution Take 3 mLs (2.5 mg total) by nebulization every 6 (six) hours as needed for shortness of breath or wheezing. 360 mL 5  . ALPRAZolam  (XANAX ) 0.5 MG tablet Take 1 tablet (0.5 mg total) by  mouth 2 (two) times daily as needed for anxiety. 30 tablet 0  . amLODipine  (NORVASC ) 2.5 MG tablet Take 2.5 mg by mouth daily.    . Ascorbic Acid  (VITAMIN C ) 1000 MG tablet Take 1,000 mg by mouth daily.    . aspirin  EC 81 MG tablet Take 1 tablet (81 mg total) by mouth daily. Swallow whole. 90 tablet 3  . atorvastatin  (LIPITOR) 40 MG tablet TAKE 1 TABLET BY MOUTH EVERYDAY AT BEDTIME 90 tablet 1  . blood glucose meter kit and supplies KIT Dispense based on patient and insurance preference. Use up to four times daily as directed. 1 each 0  . Blood Glucose Monitoring Suppl DEVI Use to test blood sugar up to  twice daily. May substitute to any manufacturer covered by patient's insurance. E11.0 1 each 0  . budesonide -glycopyrrolate-formoterol  (BREZTRI  AEROSPHERE) 160-9-4.8 MCG/ACT AERO inhaler Inhale 2 puffs into the lungs 2 (two) times daily. 3 each 4  . calcium  carbonate (OS-CAL) 600 MG TABS tablet Take 600 mg by mouth daily.    . Cholecalciferol  (VITAMIN D3) 50 MCG (2000 UT) TABS Take 2,000 Units by mouth in the morning.    . dapagliflozin  propanediol (FARXIGA ) 10 MG TABS tablet Take 1 tablet (10 mg total) by mouth daily. 90 tablet 4  . denosumab  (PROLIA ) 60 MG/ML SOSY injection Inject 60 mg into the skin every 6 (six) months. 180 mL 0  . digoxin  (LANOXIN ) 0.125 MG tablet TAKE 1 TABLET BY MOUTH DAILY 90 tablet 2  . ferrous sulfate  325 (65 FE) MG tablet Take 325 mg by mouth 2 (two) times daily with a meal.    . fluticasone  (FLONASE ) 50 MCG/ACT nasal spray Place 2 sprays into both nostrils daily as needed for allergies or rhinitis.    . furosemide  (LASIX ) 40 MG tablet Take 1 tablet by mouth daily, may take 1 extra tablet daily only as needed for weight gain or lower extremity swelling    . Glucose Blood (BLOOD GLUCOSE TEST STRIPS) STRP Use to test blood sugar up to twice daily. May substitute to any manufacturer covered by patient's insurance. E11.0 100 strip 3  . Lancet Device MISC Use to test blood sugar up to twice daily. May substitute to any manufacturer covered by patient's insurance. E11.0 1 each 0  . Lancets Misc. MISC Use to test blood sugar up to twice daily. May substitute to any manufacturer covered by patient's insurance. E11.0 100 each 3  . levothyroxine  (SYNTHROID ) 25 MCG tablet Take 1 tablet (25 mcg total) by mouth daily. 90 tablet 3  . loratadine  (CLARITIN ) 10 MG tablet Take 10 mg by mouth in the morning.    . Melatonin 12 MG TABS Take 12 mg by mouth at bedtime.    . Multiple Vitamin (MULTIVITAMIN WITH MINERALS) TABS tablet Take 1 tablet by mouth daily with breakfast.    . OXYGEN  Inhale  2 L/min into the lungs at bedtime.    . pantoprazole  (PROTONIX ) 40 MG tablet Take 1 tablet (40 mg total) by mouth 2 (two) times daily. 180 tablet 3  . predniSONE  (DELTASONE ) 10 MG tablet TAKE 1 TABLET (10 MG TOTAL) BY MOUTH DAILY WITH BREAKFAST. 90 tablet 1  . Probiotic Product (PROBIOTIC DAILY) CAPS Take 1 capsule by mouth in the morning.    . sertraline  (ZOLOFT ) 50 MG tablet TAKE 1 TABLET BY MOUTH EVERYDAY AT BEDTIME 90 tablet 1   Current Facility-Administered Medications on File Prior to Visit  Medication Dose Route Frequency Provider Last Rate Last  Admin  [name] CASTLEMAN ON 10/30/2024] denosumab  (PROLIA ) injection 60 mg  60 mg Subcutaneous Once [name], MD       Past Medical History:  Diagnosis Date  . Anemia   . Aortic atherosclerosis (HCC)   . Aspiration pneumonia due to inhalation of vomitus (HCC) 02/03/2022  . Atrial fibrillation (HCC)   . Back pain   . COPD (chronic obstructive pulmonary disease) (HCC)   . COPD exacerbation (HCC) 12/27/2021  . COVID-19 12/24/2020  . Diabetes mellitus without complication (HCC)   . Dizziness 04/08/2022  . Elevated PSA   . GAD (generalized anxiety disorder)   . GERD (gastroesophageal reflux disease)   . High cholesterol   . Hypertension   . Lingular pneumonia 01/05/2021   See cxr 01/06/20 rx with zpak/ cefipime x 5 days and then developed purulent sputum again 01/12/21 so rx levcaquin 500 mg daily x 7 days then return for cxr before more   . Lung nodule < 6cm on CT 05/29/2016  . Osteoporosis   . Oxygen  deficiency   . Pneumonia    January 2022  . 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 Cindie with a 20 mm Watchman FLX device  . RSV bronchitis 01/27/2022  . Vertebral compression fracture (HCC) 05/29/2019   T8 compression fracture noted on CT scan 05/28/2019 inpatient with chronic steroid dependent COPD - rx calcitonin nasal spray rx per PCP    Past Surgical History:  Procedure Laterality Date  .  CATARACT EXTRACTION Right   . ELBOW SURGERY  07/2022   surgery on olecranon bursa. Dr. Alyse  . IR KYPHO EA ADDL LEVEL THORACIC OR LUMBAR  11/28/2021  . LEFT ATRIAL APPENDAGE OCCLUSION N/A 08/02/2021   Procedure: LEFT ATRIAL APPENDAGE OCCLUSION;  Surgeon: Wonda Sharper, MD;  Location: [facility] INVASIVE CV LAB;  Service: Cardiovascular;  Laterality: N/A;  . SKIN SURGERY     shoulders and chest  . TEE WITHOUT CARDIOVERSION N/A 08/02/2021   Procedure: TRANSESOPHAGEAL ECHOCARDIOGRAM (TEE);  Surgeon: Wonda Sharper, MD;  Location: [facility] INVASIVE CV LAB;  Service: Cardiovascular;  Laterality: N/A;  . TEE WITHOUT CARDIOVERSION N/A 09/13/2021   Procedure: TRANSESOPHAGEAL ECHOCARDIOGRAM (TEE);  Surgeon: Francyne Headland, MD;  Location: [facility] ENDOSCOPY;  Service: Cardiovascular;  Laterality: N/A;    Family History  Problem Relation Age of Onset  . Heart disease Brother   . Heart disease Mother   . Heart disease Father   . Cancer Paternal Uncle    Social History   Socioeconomic History  . Marital status: Married    Spouse name: Not on file  . Number of children: 3  . Years of education: Not on file  . Highest education level: 10th grade  Occupational History  . Occupation: retired    Comment: truck Hospital doctor  Tobacco Use  . Smoking status: Former    Current packs/day: 0.00    Average packs/day: 2.0 packs/day for 52.0 years (104.0 ttl pk-yrs)    Types: Cigarettes    Start date: 02/03/1957    Quit date: 02/03/2009    Years since quitting: 15.4  . Smokeless tobacco: Never  . Tobacco comments:    Counseled to remain smoke free  Vaping Use  . Vaping status: Never Used  Substance and Sexual Activity  . Alcohol use: Not Currently    Alcohol/week: 0.0 standard drinks of alcohol    Comment: occ  . Drug use: No  . Sexual activity: Not on file  Other Topics  Concern  . Not on file  Social History Narrative   Originally from [place]. Previously worked driving a Surveyor, minerals. No pets currently. No  mold exposure. 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       wears sunscreen, brushes and flosses daily, see's dentist bi-annually, has smoke/carbon monoxide detectors, wears a seatbelt and practices gun safety      Social Drivers of Health   Financial Resource Strain: Low Risk  (07/19/2023)   Overall Financial Resource Strain (CARDIA)   . Difficulty of Paying Living Expenses: Not hard at all  Food Insecurity: No Food Insecurity (07/08/2024)   Hunger Vital Sign   . Worried About Programme researcher, broadcasting/film/video in the Last Year: Never true   . Ran Out of Food in the Last Year: Never true  Transportation Needs: No Transportation Needs (07/08/2024)   PRAPARE - Transportation   . Lack of Transportation (Medical): No   . Lack of Transportation (Non-Medical): No  Physical Activity: Insufficiently Active (07/19/2023)   Exercise Vital Sign   . Days of Exercise per Week: 3 days   . Minutes of Exercise per Session: 30 min  Stress: No Stress Concern Present (07/19/2023)   [employer] of Occupational Health - Occupational Stress Questionnaire   . Feeling of Stress : Not at all  Social Connections: Socially Integrated (07/19/2023)   Social Connection and Isolation Panel   . Frequency of Communication with Friends and Family: More than three times a week   . Frequency of Social Gatherings with Friends and Family: More than three times a week   . Attends Religious Services: More than 4 times per year   . Active Member of Clubs or Organizations: Yes   . Attends Banker Meetings: More than 4 times per year   . Marital Status: Married    Objective:  BP 134/82   Pulse 91   Temp 98.2 F (36.8 C)   Ht 5' 5 (1.651 m)   Wt 159 lb (72.1 kg)   SpO2 96%   BMI 26.46 kg/m      07/08/2024    8:22 AM 06/29/2024   10:50 AM 06/29/2024   10:18 AM  BP/Weight  Systolic BP 134 138 160  Diastolic BP 82 70 60  Wt. (Lbs) 159  162  BMI 26.46  kg/m2  26.96 kg/m2    Physical Exam Vitals reviewed.  Constitutional:      Appearance: Normal appearance.  Neck:     Vascular: No carotid bruit.  Cardiovascular:     Rate and Rhythm: Normal rate and regular rhythm.     Pulses: Normal pulses.     Heart sounds: Normal heart sounds.  Pulmonary:     Effort: Pulmonary effort is normal.     Breath sounds: Normal breath sounds. No wheezing, rhonchi or rales.  Abdominal:     General: Bowel sounds are normal.     Palpations: Abdomen is soft.     Tenderness: There is no abdominal tenderness.  Neurological:     Mental Status: He is alert.  Psychiatric:        Mood and Affect: Mood normal.        Behavior: Behavior normal.     {Perform Simple Foot Exam  Perform Detailed exam:1} Diabetic foot exam was performed with the following findings:   No deformities, ulcerations, or other skin breakdown Normal sensation of 10g monofilament Intact posterior tibialis and  dorsalis pedis pulses      Lab Results  Component Value Date   WBC 11.3 (H) 07/05/2024   HGB 11.3 (L) 07/05/2024   HCT 39.9 07/05/2024   PLT 300 07/05/2024   GLUCOSE 94 07/05/2024   CHOL 158 07/05/2024   TRIG 199 (H) 07/05/2024   HDL 57 07/05/2024   LDLCALC 68 07/05/2024   ALT 36 07/05/2024   AST 28 07/05/2024   NA 142 07/05/2024   K 4.4 07/05/2024   CL 101 07/05/2024   CREATININE 1.03 07/05/2024   BUN 21 07/05/2024   CO2 22 07/05/2024   TSH 3.790 05/19/2024   INR 0.9 01/25/2023   HGBA1C 6.3 (H) 07/05/2024   MICROALBUR Negative 10/09/2021      Assessment & Plan:  Other iron deficiency anemia -     Ambulatory referral to Hematology / Oncology  COPD GOLD  III   Atherosclerosis of aorta (HCC)  Chronic anemia  Mixed hyperlipidemia  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HCC)  Age-related osteoporosis with current pathological fracture with routine healing, subsequent encounter  Paroxysmal atrial fibrillation (HCC)  Oxygen   dependent  Presence of heart assist device (HCC)     No orders of the defined types were placed in this encounter.   Orders Placed This Encounter  Procedures  . Ambulatory referral to Hematology / Oncology     Follow-up: No follow-ups on file.   I,Marla I Leal-Borjas,acting as a scribe for [name] Free, MD.,have documented all relevant documentation on the behalf of [name] Free, MD,as directed by  [name] Free, MD while in the presence of [name] Free, MD.   An After Visit Summary was printed and given to the patient.  [name] Free, MD [facility] [phone]

## 2024-07-11 NOTE — Assessment & Plan Note (Signed)
 Management per specialist.

## 2024-07-11 NOTE — Assessment & Plan Note (Signed)
 Diabetes and cholesterol at goal Recommend check feet daily. Recommend annual eye exams. Medicines: Farxiga  10 mg daily and lipitor 40 mg before bed.  Continue to work on eating a healthy diet.  Labs reviewed

## 2024-07-11 NOTE — Assessment & Plan Note (Signed)
Check blood count

## 2024-07-12 NOTE — Assessment & Plan Note (Signed)
 On oxygen  2 L continuous at night.

## 2024-07-13 ENCOUNTER — Ambulatory Visit (INDEPENDENT_AMBULATORY_CARE_PROVIDER_SITE_OTHER)

## 2024-07-13 DIAGNOSIS — D508 Other iron deficiency anemias: Secondary | ICD-10-CM | POA: Diagnosis not present

## 2024-07-14 ENCOUNTER — Ambulatory Visit: Payer: Self-pay | Admitting: Family Medicine

## 2024-07-14 LAB — POC HEMOCCULT BLD/STL (HOME/3-CARD/SCREEN)
Card #2 Fecal Occult Blod, POC: NEGATIVE
Card #3 Fecal Occult Blood, POC: NEGATIVE
Fecal Occult Blood, POC: NEGATIVE

## 2024-07-14 NOTE — Progress Notes (Signed)
 Patient is in office today for a nurse visit for Hemoccult Card. Patient Hemoccult Card results were sent to provider.

## 2024-07-22 ENCOUNTER — Other Ambulatory Visit: Payer: Self-pay | Admitting: Family Medicine

## 2024-07-25 ENCOUNTER — Other Ambulatory Visit: Payer: Self-pay | Admitting: Hematology and Oncology

## 2024-07-25 DIAGNOSIS — D509 Iron deficiency anemia, unspecified: Secondary | ICD-10-CM

## 2024-07-25 NOTE — Progress Notes (Unsigned)
 Paul [name], male    DOB: 1946-02-18    MRN: [id number]   Brief patient profile:  40   yowm MM/Quit smoking  02/03/09  with GOLD III spirometry baseline doe x able to golf. Only using 02 at  [facility] with  maint on symbicort / spiriva  and no chronic pred/ albuterol  twice daily at most  much worse mid March 2020.   PFT 05/29/16: FVC 2.08 L (86%) FEV1 0.83 L (31%) FEV1/FVC 0.40 FEF 25-75 0.30 L (14%) no bronchodilator response TLC 6.56 L (107%) RV 167% ERV 160% DLCO corrected 60% (hemoglobin 10.9) 07/15/13: FVC 2.68 L (60%) FEV1 1.63 L (53%) FEV1/FVC 0.61 FEF 25-75 0.76 L (26%) 02/04/12: FVC 2.18 L (57%) FEV1 1.09 L (36%) FEV1/FVC 0.50 FEF 25-75 0.33 L (11%)   Admit date: 03/19/2019 Discharge date: 03/25/2019   Recommendations for Outpatient Follow-up:  F/u with PCP within a week  for hospital discharge follow up, repeat cbc/bmp at follow up F/u with cardiology for new diagnosis of afib Home 02   Discharge Diagnoses:      Active Hospital Problems    Diagnosis Date Noted   PAF (paroxysmal atrial fibrillation) (HCC)     Acute on chronic respiratory failure with hypoxia (HCC)     COPD exacerbation (HCC) 03/19/2019         Filed Weights    03/23/19 1700 03/24/19 0503 03/25/19 0528  Weight: 60.2 kg 60 kg 62.3 kg      History of present illness: (per admitting MD Dr Allana) PCP: [name], MD  Patient coming from: Home   Chief Complaint: Worsening shortness of breath   HPI: Paul [name] is a 78 y.o. male with history h/o COPD, hyperlipidemia who at baseline was using 2 L O2 via nasal cannula only at night and follows [facility] (Dr. Theophilus) as outpatient presents with worsening shortness of breath.  Patient reports that at baseline he has cough with white phlegm and is able to walk around doing his ADLs without shortness of breath and uses O2 only at night.  2 weeks back he developed productive cough with green phlegm and shortness of breath but no fevers.  He underwent CT  chest with contrast on March 18 which was negative for PE or infiltrates.  He apparently was given doxycycline /prednisone  course at that time which helped him transiently but symptoms worsened again past Monday with shortness of breath on minimum exertion/cough and he noticed his pulse ox was dropping to 74% .  He started using 2 L O2 nasal cannula continuous.  On Tuesday, he called pulmonary office given persistent symptoms and also pulse oximetry showing O2 sats 84% on exertion and 93% at rest in spite of using continuous O2.  He was advised by Dr. Theophilus to use 60 mg prednisone  x3 days followed by 10 mg taper every 3 days.  He was also advised to use nebulizer treatments every 3 hours and continue Symbicort  inhaler twice daily.  Patient was supposed to start 50 mg prednisone  today but woke up feeling extremely short of breath.  He states he is also been having bad bouts of cough at night with wheezing and bronchospasm.   He presented to [facility] ED where he was noted to be afebrile but hypoxic, required BiPAP for 3 hours, chest x-ray was negative for infiltrates.  Patient denies any recent history of travel or known sick contacts with flu positive or coronavirus positive patients.  He lives at home with his wife who  is recovering from lung cancer and completed chemotherapy 1 month back.   Hospitalist service was called to request direct admission to medical floor but ED to ED transfer facilitated for thorough clinical evaluation prior to requesting medical bed given coronavirus pandemic.  In the ED at [facility], patient has been saturating well on 3 L nasal cannula but appears tachypneic on talking full sentences.  He denies any chest pain, denies any fevers or leg swellings.   Hospital Course:  Active Problems:   COPD exacerbation (HCC)   PAF (paroxysmal atrial fibrillation) (HCC)   Acute on chronic respiratory failure with hypoxia (HCC)     Acute on Hypoxic respiratory failure, COPD  exacerbation, Progression of severe COPD -Emphysema, with no evidence of superimposed acute cardiopulmonary disease -improving on iv steroids, oral zitrhomax, he finished total of 5 days of zithromax  in the hospital, he is discharged on slow prednisone  taper.  - o2 dropped to 86% while ambulating on room air, discharged on continuous home o2 ( previously on nighttime o2 only) -he is to follow with Dr Theophilus    Paroxsymal afib, New diagnosis of afib -he is started on eliquis  (CHADS-VASC 2, age and aortic atherosclerosis), cardizem , lasix  -cardiology consulted recommend Diltiazem  CD 360. Avoid amiodarone and beta blockers given his underlying lung dz. -outpatient echo per cardiology     Aortic and multivessel coronary atherosclerosis  - Changed to high intensity statin, atorvastatin  40mg  PO QD. Check lipids and ALT as outpatient in 3 months.   - ASA 81. Aggressive secondary prevention.  - No chest pain.         History of Present Illness  03/29/2019  Pulmonary/ extended post hosp f/u eval/Paul [name]  Re GOLD III copd ? 02 dep now Chief Complaint  Patient presents with   Hospitalization Follow-up    Breathing has improved some. He has not used his albuterol  inhaler or neb since hospital d/c.  Dyspnea:  mb and back ok off 02 sats upper 80s at end  Cough: none Sleep: recliner x 45 degrees = baseline 02 2lpm hs  SABA use: none  Since d/c on symb 160 2bid and spiriva  each pm (technique 50% on both, see a/p) On pred 50 mg daily on day as ov rec Plan A = Automatic = symbicort  160 Take 2 puffs first thing in am and then another 2 puffs about 12 hours later.  Spiriva  x 2 puffs  right after symbicort   Work on inhaler technique: Plan B = Backup Only use your albuterol  inhaler(Proventil /Proair )  as a rescue medication Plan C = Crisis - only use your albuterol  nebulizer if you first try Plan B and it fails to help > ok to use the nebulizer up to every 4 hours but if start needing it regularly call  for immediate appointment Plan D = Doctor - call me if B and C not adequate Plan E = ER - go to ER or call 911 if all else fails   02  2lpm at bedtime and adjust to keep it over 90% with activity    02/10/2023  post hosp  f/u ov/Anishka Bushard re:  copd GOLD 3    maint on breztri  and prednisone  20 mg ceiling and 5 mg floor   Chief Complaint  Patient presents with   Follow-up    Breathing improved.  02: 2lpm and titrates as high as 3lpm  Rec No change in medications Make sure you check your oxygen  saturation  AT  your highest level of activity (not  after you stop)   to be sure it stays over 90%        05/07/2023  3 m f/u ov/Vonnie Ligman re: GOLD 3    maint on Breztri  and prednisone  10 mg daily   Chief Complaint  Patient presents with   Follow-up    Doing well  Dyspnea:  30 min moderate pace  Cough: none  Sleeping:  7 in bed blocks and cpap machine no resp cc  SABA use: each am neb whether needs it or now  02: 2lpm / not using at rest and freq not using 02 at all during the day but not monitoring ex sats as rec   Rec Plan A = Automatic = Always=    Breztri  Take 2 puffs first thing in am and then another 2 puffs about 12 hours later.   Plan B = Backup (to supplement plan A, not to replace it) Only use your albuterol  inhaler as a rescue medication Plan C = Crisis (instead of Plan B but only if Plan B stops working) - only use your albuterol  nebulizer if you first try Plan B  Also  Ok to try albuterol  15 min before an activity (on alternating days)  that you know would usually make you short of breath      07/30/2024  f/u ov/Taylia Berber re: GOLD    maint on Breztri / Prednisone  10 mg   Chief Complaint  Patient presents with   Medical Management of Chronic Issues    3mt F/u  Dyspnea:  treadmill x 30 min  at 2.6 slt grade with sats 89-90%  Cough: none  Sleeping: bed blocks x 7 in and one pillow s  resp cc  SABA use: minimal rarely hfa/ never neb  02: 2lpm hs and up 3 on POC   Lung cancer screening :   referred     No obvious day to day or daytime variability or assoc excess/ purulent sputum or mucus plugs or hemoptysis or cp or chest tightness, subjective wheeze or overt sinus or hb symptoms.    Also denies any obvious fluctuation of symptoms with weather or environmental changes or other aggravating or alleviating factors except as outlined above   No unusual exposure hx or h/o childhood pna/ asthma or knowledge of premature birth.  Current Allergies, Complete Past Medical History, Past Surgical History, Family History, and Social History were reviewed in [employer] record.  ROS  The following are not active complaints unless bolded Hoarseness, sore throat, dysphagia, dental problems, itching, sneezing,  nasal congestion or discharge of excess mucus or purulent secretions, ear ache,   fever, chills, sweats, unintended wt loss or wt gain, classically pleuritic or exertional cp,  orthopnea pnd or arm/hand swelling  or leg swelling, presyncope, palpitations, abdominal pain, anorexia, nausea, vomiting, diarrhea  or change in bowel habits or change in bladder habits, change in stools or change in urine, dysuria, hematuria,  rash, arthralgias, visual complaints, headache, numbness, weakness or ataxia or problems with walking or coordination,  change in mood or  memory.        Current Meds  Medication Sig   Accu-Chek Softclix Lancets lancets Check fasting sugar once daily.   acetaminophen  (TYLENOL ) 500 MG tablet Take 1,000 mg by mouth at bedtime as needed (pain).   albuterol  (PROAIR  HFA) 108 (90 Base) MCG/ACT inhaler Inhale 2 puffs into the lungs as needed for wheezing or shortness of breath.   albuterol  (PROVENTIL ) (2.5 MG/3ML) 0.083% nebulizer solution Take 3  mLs (2.5 mg total) by nebulization every 6 (six) hours as needed for shortness of breath or wheezing.   ALPRAZolam  (XANAX ) 0.5 MG tablet Take 1 tablet (0.5 mg total) by mouth 2 (two) times daily as needed for anxiety.    amLODipine  (NORVASC ) 2.5 MG tablet Take 2.5 mg by mouth daily.   Ascorbic Acid  (VITAMIN C ) 1000 MG tablet Take 1,000 mg by mouth daily.   aspirin  EC 81 MG tablet Take 1 tablet (81 mg total) by mouth daily. Swallow whole.   atorvastatin  (LIPITOR) 40 MG tablet TAKE 1 TABLET BY MOUTH EVERYDAY AT BEDTIME   blood glucose meter kit and supplies KIT Dispense based on patient and insurance preference. Use up to four times daily as directed.   Blood Glucose Monitoring Suppl DEVI Use to test blood sugar up to twice daily. May substitute to any manufacturer covered by patient's insurance. E11.0   budesonide -glycopyrrolate-formoterol  (BREZTRI  AEROSPHERE) 160-9-4.8 MCG/ACT AERO inhaler Inhale 2 puffs into the lungs 2 (two) times daily.   calcium  carbonate (OS-CAL) 600 MG TABS tablet Take 600 mg by mouth daily.   Cholecalciferol  (VITAMIN D3) 50 MCG (2000 UT) TABS Take 2,000 Units by mouth in the morning.   dapagliflozin  propanediol (FARXIGA ) 10 MG TABS tablet Take 1 tablet (10 mg total) by mouth daily.   denosumab  (PROLIA ) 60 MG/ML SOSY injection Inject 60 mg into the skin every 6 (six) months.   digoxin  (LANOXIN ) 0.125 MG tablet TAKE 1 TABLET BY MOUTH DAILY   ferrous sulfate  325 (65 FE) MG tablet Take 325 mg by mouth 2 (two) times daily with a meal. (Patient taking differently: Take 325 mg by mouth daily with breakfast.)   fluticasone  (FLONASE ) 50 MCG/ACT nasal spray Place 2 sprays into both nostrils daily as needed for allergies or rhinitis.   furosemide  (LASIX ) 40 MG tablet Take 1 tablet by mouth daily, may take 1 extra tablet daily only as needed for weight gain or lower extremity swelling   Glucose Blood (BLOOD GLUCOSE TEST STRIPS) STRP Use to test blood sugar up to twice daily. May substitute to any manufacturer covered by patient's insurance. E11.0   Lancet Device MISC Use to test blood sugar up to twice daily. May substitute to any manufacturer covered by patient's insurance. E11.0   Lancets Misc. MISC  Use to test blood sugar up to twice daily. May substitute to any manufacturer covered by patient's insurance. E11.0   levothyroxine  (SYNTHROID ) 25 MCG tablet Take 1 tablet (25 mcg total) by mouth daily.   loratadine  (CLARITIN ) 10 MG tablet Take 10 mg by mouth in the morning.   Melatonin 12 MG TABS Take 12 mg by mouth at bedtime.   Multiple Vitamin (MULTIVITAMIN WITH MINERALS) TABS tablet Take 1 tablet by mouth daily with breakfast.   OXYGEN  Inhale 2 L/min into the lungs at bedtime.   pantoprazole  (PROTONIX ) 40 MG tablet Take 1 tablet (40 mg total) by mouth 2 (two) times daily.   predniSONE  (DELTASONE ) 10 MG tablet TAKE 1 TABLET (10 MG TOTAL) BY MOUTH DAILY WITH BREAKFAST.   Probiotic Product (PROBIOTIC DAILY) CAPS Take 1 capsule by mouth in the morning.   sertraline  (ZOLOFT ) 50 MG tablet TAKE 1 TABLET BY MOUTH EVERYDAY AT BEDTIME   Current Facility-Administered Medications for the 07/30/24 encounter (Office Visit) with Laray Rivkin B, MD  Medication   [START ON 10/30/2024] denosumab  (PROLIA ) injection 60 mg                  Objective:  Wts  07/30/2024         160  05/07/2023       151  02/10/2023       157   03/19/2022       152  05/29/2021          155 02/19/2021        151  10/03/2020      155  02/24/2020         137  01/27/2020         144 10/26/2019        156  07/26/2019          144   06/15/19 136 lb (61.7 kg)  05/29/19 134 lb 7.7 oz (61 kg)  05/28/19 134 lb 3.2 oz (60.9 kg)   Vital signs reviewed  07/30/2024  - Note at rest 02 sats  93% on 3lpm POC  General appearance:    amb pleasant somewhat cushingnoid wm with centripetal obesity   HEENT :  Oropharynx  clear/ full dentures  Nasal turbinates nl    NECK :  without JVD/Nodes/TM/ nl carotid upstrokes bilaterally   LUNGS: no acc muscle use,  Mod barrel  contour chest wall with bilateral  Distant bs s audible wheeze and  without cough on insp or exp maneuvers and mod  Hyperresonant  to  percussion bilaterally     CV:  RRR  no  s3 or murmur or increase in P2, and no edema   ABD:  soft and nontender with pos mid insp Hoover's  in the supine position. No bruits or organomegaly appreciated, bowel sounds nl  MS:   Ext warm without deformities or   obvious joint restrictions , calf tenderness, cyanosis or clubbing  SKIN: warm and dry without lesions    NEURO:  alert, approp, nl sensorium with  no motor or cerebellar deficits apparent.                        Assessment     Assessment & Plan COPD GOLD  III  GOLD 3 COPD/ Quit smoking 01/2009/MM  02/04/12 Spiro: FEV1 36% FVC 57% FEF 25 75    11% -05/29/16  FEV1 0.83 L (31%) FEV1/FVC 0.40 FEF 25-75 0.30 L (14%) no bronchodilator response p ? rx prior, DLCO corrected 60% (hemoglobin 10.9) - 04/15/2019 flutter valve training  - alpha one screen 06/15/2019  MM  Level 178 - 12/27/2019 added ppi bid ac otc for atypical spells of sob  - 01/27/2020 changed lopressor  to bisoprolol  due to refractory wheeze/ sob on lopressor  100 mg daily -  Approx 01/2020 started daily prednisone   - Sinus CT  02/03/20 1. Minimal paranasal sinus mucosal thickening CT chest 02/03/20 1. Coronary artery calcifications are noted suggesting coronary artery disease. 2. Emphysematous disease is noted in the upper lobes bilaterally. 3. Interval development of several old thoracic compression fractures since prior exam. No definite acute fracture is noted.  - 02/24/20 trial of daliresp  250 > could not tol   - 02/19/2021  After extensive coaching inhaler device,  effectiveness =   80% > try change to breztri   - PFT's  02/16/24  FEV1 0.80 (33 % ) ratio 0.49  p 0 % improvement from saba p spiriva  prior to study with DLCO  8.8 (41%)   and FV curve classic severe concavity     - 07/30/2024 added nucala in effort to reduce pred with celing 20 and floor 5 mg  Group D (now reclassified as E) in terms of symptom/risk and laba/lama/ICS  therefore appropriate rx at this point >>>  breztri  and now adding nucala to reduce dep  on pred for doe /exacerbations  Discussed in detail all the  indications, usual  risks and alternatives  relative to the benefits with patient who agrees to proceed with Rx as outlined.         Former cigarette smoker Low-dose CT lung cancer screening is recommended for patients who are 57-15 years of age with a 20+ pack-year history of smoking and who are currently smoking or quit <=15 years ago. No coughing up blood  No unintentional weight loss of > 15 pounds in the last 6 months - pt is eligible for scanning yearly until age 60 > referred   Discussed in detail all the  indications, usual  risks and alternatives  relative to the benefits with patient who agrees to proceed with w/u as outlined.     Chronic respiratory failure with hypoxia (HCC) Hs 02 since around 2005 - referred for best fit 05/29/2021  - as of 07/30/2024  on 2lpm hs and up to 3lpm POC but not titrating higher and rec keeps sats > 90% with exertion    Advised: Make sure you check your oxygen  saturation  AT  your highest level of activity (not after you stop)   to be sure it stays over 90% and adjust  02 flow upward to maintain this level if needed but remember to turn it back to previous settings when you stop (to conserve your supply).      Each maintenance medication was reviewed in detail including emphasizing most importantly the difference between maintenance and prns and under what circumstances the prns are to be triggered using an action plan format where appropriate.  Total time for H and P, chart review, counseling, reviewing hfa/02/ pulse ox  device(s) and generating customized AVS unique to this office visit / same day charting = 34 min           Patient Instructions  We will submit your records for approval of Nucala   My office will be contacting you by phone for referral to lung cancer screening   [phone]) - if you don't hear back from my office within one week,  please call us  back or notify us   thru MyChart and we'll address it right away.    Ceiling on prednisone  is 20 mg per day and floor is 5 mg    Please schedule a follow up visit in 3 months but call sooner if needed with Tammy P    Ozell America, MD 07/30/2024

## 2024-07-25 NOTE — Progress Notes (Unsigned)
 [facility] [address] [place],  [place]  [address] [phone]  Clinic Day:  07/26/2024   Referring physician: [name], MD  Patient Care Team: Patient Care Team: [name], MD as PCP - General (Family Medicine) Jeffrie Oneil BROCKS, MD as PCP - Cardiology (Cardiology) Cindie Ole DASEN, MD as PCP - Electrophysiology (Cardiology) Brien Belvie BRAVO, MD as Attending Physician (Pulmonary Disease) Darlean Ozell NOVAK, MD as Consulting Physician (Pulmonary Disease) [facility] as Registered Nurse [facility] and Palliative Medicine) Carlin Delon BROCKS, NP as Nurse Practitioner (Cardiology)   REASON FOR CONSULTATION:  Iron deficiency anemia  HISTORY OF PRESENT ILLNESS:   Paul [name] is a 78 y.o. male with iron deficiency anemia who is referred in consultation by Clapper Sherre, MD for assessment and management. On July 14 his hemoglobin was 11.3 with an MCV of 74.  WBCs were 11.3 with 60% neutrophils, 28% lymphocytes, 9% monocytes, 2% eosinophils and 1% basophils.  Platelets were normal.  TIBC 361, iron saturation 11%, and ferritin 26. CMP was normal.  Stool Hemoccults x 3 were negative. In April 2025, hemoglobin was 11.4 with an MCV of 74.  TIBC was 327, iron saturation 14%.  WBCs 11.4, 71% neutrophils, 21% lymphocytes, 8% monocytes.  Review of his records reveals the has had mild chronic anemia dating back to March 2020.  His hemoglobin usually ranges between 10 and 13.  He has had microcytosis only in the last year.  He has had mild chronic leukocytosis since January 2024.     The patient reports 2 episodes of vomiting, mild diarrhea and chills last night, which is unusual for him.  He denies fever.  He reports mild shortness of breath today, so using O2. He usually only uses O2 when exercising and at night with CPAP.  He walks on the treadmill daily gym 3 times a week.  He denies progressive fatigue concerning for worsening anemia.  He reports occasional  lightheadedness when he stands.  He denies any overt form of blood loss. He states he had been on ferrous sulfate  for many years, but stopped on his own.  He is not sure how long he was off iron, but resumed this about 2 weeks ago.   He states his diet fairly well-balanced.  He had a full last week and had some bruising of his knees.  He states he received IV iron in the past during hospitalization for COPD, but I could not find this in his record. He states he has not required transfusion.  He underwent EDG and colonoscopy with Dr. Towana in the past.  He is not sure when his last colonoscopy was.  He has been on prednisone  for many years for COPD. He takes pantoprazole  twice daily and aspirin  81 mg daily.  He was previously on apixaban , but this was able to be discontinued after his Watchman procedure.  He states he was was a previous blood donor, but has not given blood in many years.   Past Medical History: Atrial fibrillation, hypertension, oxygen  dependent COPD, allergies, obstructive sleep apnea, type 2 diabetes mellitus, hyperlipidemia, GERD, aortic atherosclerosis, osteoporosis with pathologic fracture.  Status post Watchman procedure, status post kyphoplasty, right elbow surgery, right cataract extraction.  Social History: He is a former smoker stating he quit 15 years ago.  Smoking included 2 packs of cigarettes per day for 52 years.  He denies current alcohol use or previous heavy alcohol use.  He denies other substance use.  He was born and raised in [place] and has been in [place] since the 1963. He is married, with 3 adult children, 8 grandchildren and 3 great-grandchildren. He is retired Naval architect.  His wife accompanies him today.  Family History: He denies any family history of anemia or other blood dyscrasia.  He states a paternal uncle had an unknown cancer in his unknown age.  There is no other known family history of malignancy.   REVIEW OF SYSTEMS:   Review of Systems   Constitutional:  Positive for chills. Negative for appetite change, fatigue, fever and unexpected weight change.  HENT:   Negative for lump/mass, mouth sores, nosebleeds and sore throat.   Respiratory:  Positive for shortness of breath (with exertion). Negative for cough and hemoptysis.   Cardiovascular:  Negative for chest pain and leg swelling.  Gastrointestinal:  Positive for diarrhea, nausea and vomiting. Negative for abdominal pain, blood in stool and constipation.  Genitourinary:  Negative for difficulty urinating, dysuria, frequency and hematuria.   Musculoskeletal:  Positive for gait problem (fell last week, does not use cane or walker). Negative for arthralgias, back pain, myalgias and neck pain.  Skin:  Negative for itching, rash and wound.  Neurological:  Positive for gait problem (fell last week, does not use cane or walker) and light-headedness. Negative for dizziness, extremity weakness, headaches and numbness.  Hematological:  Negative for adenopathy. Bruises/bleeds easily.  Psychiatric/Behavioral:  Negative for depression and sleep disturbance. The patient is not nervous/anxious.      VITALS:   Blood pressure (!) 123/57, pulse (!) 101, temperature 98.5 F (36.9 C), temperature source Oral, resp. rate 20, height 5' 5 (1.651 m), weight 161 lb 14.4 oz (73.4 kg), SpO2 100%.  Wt Readings from Last 3 Encounters:  07/26/24 161 lb 14.4 oz (73.4 kg)  07/08/24 159 lb (72.1 kg)  06/29/24 162 lb (73.5 kg)    Body mass index is 26.94 kg/m.  Performance status (ECOG): 1 - Symptomatic but completely ambulatory  PHYSICAL EXAM:   Physical Exam Vitals and nursing note reviewed.  Constitutional:      General: He is not in acute distress.    Appearance: Normal appearance. He is normal weight.  HENT:     Head: Normocephalic and atraumatic.     Mouth/Throat:     Mouth: Mucous membranes are moist.     Pharynx: Oropharynx is clear. No oropharyngeal exudate or posterior oropharyngeal  erythema.  Eyes:     General: No scleral icterus.    Extraocular Movements: Extraocular movements intact.     Conjunctiva/sclera: Conjunctivae normal.     Pupils: Pupils are equal, round, and reactive to light.  Cardiovascular:     Rate and Rhythm: Normal rate and regular rhythm.     Heart sounds: Normal heart sounds. No murmur heard.    No friction rub. No gallop.  Pulmonary:     Effort: Pulmonary effort is normal.     Breath sounds: Normal breath sounds. No wheezing, rhonchi or rales.  Abdominal:     General: Bowel sounds are normal. There is no distension.     Palpations: Abdomen is soft. There is no mass.     Tenderness: There is no abdominal tenderness.  Musculoskeletal:        General: Normal range of motion.     Cervical back: Normal range of motion and neck supple. No tenderness.     Right lower leg: No edema.     Left lower leg: No edema.  Lymphadenopathy:     Cervical: No cervical adenopathy.     Upper Body:     Right upper body: No supraclavicular or axillary adenopathy.     Left upper body: No supraclavicular or axillary adenopathy.     Lower Body: No right inguinal adenopathy. No left inguinal adenopathy.  Skin:    General: Skin is warm and dry.     Coloration: Skin is not jaundiced.     Findings: No rash.  Neurological:     Mental Status: He is alert and oriented to person, place, and time.     Cranial Nerves: No cranial nerve deficit.  Psychiatric:        Mood and Affect: Mood normal.        Behavior: Behavior normal.        Thought Content: Thought content normal.      LABS:      Latest Ref Rng & Units 07/26/2024    8:02 AM 07/05/2024    8:02 AM 03/29/2024    8:16 AM  CBC  WBC 4.0 - 10.5 K/uL 18.2  11.3  11.4   Hemoglobin 13.0 - 17.0 g/dL 88.8  88.6  88.3   Hematocrit 39.0 - 52.0 % 37.7  39.9  39.0   Platelets 150 - 400 K/uL 275  300  337     Latest Reference Range & Units 07/26/24 08:02  Neutrophils % 79  Lymphocytes % 11  Monocytes Relative % 8   Eosinophil % 2  Basophil % 0  Immature Granulocytes % 0  NEUT# 1.7 - 7.7 K/uL 14.4 (H)  Lymphs Abs 0.7 - 4.0 K/uL 1.9  Monocyte # 0.1 - 1.0 K/uL 1.5 (H)  Eosinophils Absolute 0.0 - 0.5 K/uL 0.3  Basophils Absolute 0.0 - 0.1 K/uL 0.1  (H): Data is abnormally high    07/26/24 08:01  RBC Morphology MORPHOLOGY UNREMARKABLE  WBC Morphology MORPHOLOGY UNREMARKABLE  Plt Morphology Normal platelet morphology       Latest Ref Rng & Units 07/05/2024    8:02 AM 03/29/2024    8:16 AM 10/27/2023    7:51 AM  CMP  Glucose 70 - 99 mg/dL 94  884  887   BUN 8 - 27 mg/dL 21  16  14    Creatinine 0.76 - 1.27 mg/dL 8.96  8.91  9.06   Sodium 134 - 144 mmol/L 142  141  144   Potassium 3.5 - 5.2 mmol/L 4.4  4.2  4.8   Chloride 96 - 106 mmol/L 101  98  100   CO2 20 - 29 mmol/L 22  26  26    Calcium  8.6 - 10.2 mg/dL 9.4  9.8  9.6   Total Protein 6.0 - 8.5 g/dL 6.3  6.6  6.6   Total Bilirubin 0.0 - 1.2 mg/dL <9.7  0.2  <9.7   Alkaline Phos 44 - 121 IU/L 91  96  119   AST 0 - 40 IU/L 28  24  24    ALT 0 - 44 IU/L 36  40  39     Lab Results  Component Value Date   TIBC 361 07/05/2024   TIBC 327 03/29/2024   TIBC 241 (L) 04/05/2021   FERRITIN 26 (L) 07/05/2024   FERRITIN 280 04/05/2021   FERRITIN 317 01/15/2021   IRONPCTSAT 11 (L) 07/05/2024   IRONPCTSAT 14 (L) 03/29/2024   IRONPCTSAT 25 04/05/2021   Lab Results  Component Value Date   LDH 294 (H) 01/15/2021   LDH 201 (H)  12/25/2020   LDH 175 12/24/2020    STUDIES:   No results found.    HISTORY:   Past Medical History:  Diagnosis Date   Anemia    Aortic atherosclerosis (HCC)    Aspiration pneumonia due to inhalation of vomitus (HCC) 02/03/2022   Atrial fibrillation (HCC)    Back pain    COPD (chronic obstructive pulmonary disease) (HCC)    COPD exacerbation (HCC) 12/27/2021   COVID-19 12/24/2020   Diabetes mellitus without complication (HCC)    Dizziness 04/08/2022   Elevated PSA    GAD (generalized anxiety disorder)     GERD (gastroesophageal reflux disease)    High cholesterol    Hypertension    Lingular pneumonia 01/05/2021   See cxr 01/06/20 rx with zpak/ cefipime x 5 days and then developed purulent sputum again 01/12/21 so rx levcaquin 500 mg daily x 7 days then return for cxr before more    Lung nodule < 6cm on CT 05/29/2016   Osteoporosis    Oxygen  deficiency    Pneumonia    January 2022   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 Cindie with a 20 mm Watchman FLX device   RSV bronchitis 01/27/2022   Vertebral compression fracture (HCC) 05/29/2019   T8 compression fracture noted on CT scan 05/28/2019 inpatient with chronic steroid dependent COPD - rx calcitonin nasal spray rx per PCP     Past Surgical History:  Procedure Laterality Date   CATARACT EXTRACTION Right    ELBOW SURGERY  07/2022   surgery on olecranon bursa. Dr. Alyse   IR KYPHO EA ADDL LEVEL THORACIC OR LUMBAR  11/28/2021   LEFT ATRIAL APPENDAGE OCCLUSION N/A 08/02/2021   Procedure: LEFT ATRIAL APPENDAGE OCCLUSION;  Surgeon: Wonda Sharper, MD;  Location: [facility] INVASIVE CV LAB;  Service: Cardiovascular;  Laterality: N/A;   SKIN SURGERY     shoulders and chest   TEE WITHOUT CARDIOVERSION N/A 08/02/2021   Procedure: TRANSESOPHAGEAL ECHOCARDIOGRAM (TEE);  Surgeon: Wonda Sharper, MD;  Location: [facility] INVASIVE CV LAB;  Service: Cardiovascular;  Laterality: N/A;   TEE WITHOUT CARDIOVERSION N/A 09/13/2021   Procedure: TRANSESOPHAGEAL ECHOCARDIOGRAM (TEE);  Surgeon: Francyne Headland, MD;  Location: [facility] ENDOSCOPY;  Service: Cardiovascular;  Laterality: N/A;    Family History  Problem Relation Age of Onset   Heart disease Brother    Heart disease Mother    Heart disease Father    Cancer Paternal Uncle     Social History:  reports that he quit smoking about 15 years ago. His smoking use included cigarettes. He started smoking about 67 years ago. He has a 104 pack-year smoking history. He has never  used smokeless tobacco. He reports that he does not currently use alcohol. He reports that he does not use drugs.The patient is accompanied by his wife today.  Allergies:  Allergies  Allergen Reactions   Tape Other (See Comments)    PLEASE USE COBAN WRAP IN LIEU OF TAPE, as it pulls off the skin!!   Daliresp  [Roflumilast ] Diarrhea and Nausea And Vomiting   Levaquin  [Levofloxacin ] Palpitations and Other (See Comments)    Made the B/P fluctuate and heart raced   Alendronate Anxiety and Other (See Comments)    Jittery/nervous    Current Medications: Current Outpatient Medications  Medication Sig Dispense Refill   albuterol  (PROAIR  HFA) 108 (90 Base) MCG/ACT inhaler Inhale 2 puffs into the lungs as needed for wheezing or shortness of breath.  albuterol  (PROVENTIL ) (2.5 MG/3ML) 0.083% nebulizer solution Take 3 mLs (2.5 mg total) by nebulization every 6 (six) hours as needed for shortness of breath or wheezing. 360 mL 5   ALPRAZolam  (XANAX ) 0.5 MG tablet Take 1 tablet (0.5 mg total) by mouth 2 (two) times daily as needed for anxiety. 30 tablet 0   amLODipine  (NORVASC ) 2.5 MG tablet Take 2.5 mg by mouth daily.     Ascorbic Acid  (VITAMIN C ) 1000 MG tablet Take 1,000 mg by mouth daily.     aspirin  EC 81 MG tablet Take 1 tablet (81 mg total) by mouth daily. Swallow whole. 90 tablet 3   atorvastatin  (LIPITOR) 40 MG tablet TAKE 1 TABLET BY MOUTH EVERYDAY AT BEDTIME 90 tablet 1   budesonide -glycopyrrolate-formoterol  (BREZTRI  AEROSPHERE) 160-9-4.8 MCG/ACT AERO inhaler Inhale 2 puffs into the lungs 2 (two) times daily. 3 each 4   calcium  carbonate (OS-CAL) 600 MG TABS tablet Take 600 mg by mouth daily.     Cholecalciferol  (VITAMIN D3) 50 MCG (2000 UT) TABS Take 2,000 Units by mouth in the morning.     dapagliflozin  propanediol (FARXIGA ) 10 MG TABS tablet Take 1 tablet (10 mg total) by mouth daily. 90 tablet 4   denosumab  (PROLIA ) 60 MG/ML SOSY injection Inject 60 mg into the skin every 6 (six)  months. 180 mL 0   digoxin  (LANOXIN ) 0.125 MG tablet TAKE 1 TABLET BY MOUTH DAILY 90 tablet 2   ferrous sulfate  325 (65 FE) MG tablet Take 325 mg by mouth 2 (two) times daily with a meal. (Patient taking differently: Take 325 mg by mouth daily with breakfast.)     fluticasone  (FLONASE ) 50 MCG/ACT nasal spray Place 2 sprays into both nostrils daily as needed for allergies or rhinitis.     furosemide  (LASIX ) 40 MG tablet Take 1 tablet by mouth daily, may take 1 extra tablet daily only as needed for weight gain or lower extremity swelling     levothyroxine  (SYNTHROID ) 25 MCG tablet Take 1 tablet (25 mcg total) by mouth daily. 90 tablet 3   loratadine  (CLARITIN ) 10 MG tablet Take 10 mg by mouth in the morning.     Melatonin 12 MG TABS Take 12 mg by mouth at bedtime.     Multiple Vitamin (MULTIVITAMIN WITH MINERALS) TABS tablet Take 1 tablet by mouth daily with breakfast.     OXYGEN  Inhale 2 L/min into the lungs at bedtime.     pantoprazole  (PROTONIX ) 40 MG tablet Take 1 tablet (40 mg total) by mouth 2 (two) times daily. 180 tablet 3   predniSONE  (DELTASONE ) 10 MG tablet TAKE 1 TABLET (10 MG TOTAL) BY MOUTH DAILY WITH BREAKFAST. 90 tablet 1   Probiotic Product (PROBIOTIC DAILY) CAPS Take 1 capsule by mouth in the morning.     sertraline  (ZOLOFT ) 50 MG tablet TAKE 1 TABLET BY MOUTH EVERYDAY AT BEDTIME 90 tablet 1   Accu-Chek Softclix Lancets lancets Check fasting sugar once daily. 100 each 3   acetaminophen  (TYLENOL ) 500 MG tablet Take 1,000 mg by mouth at bedtime as needed (pain).     blood glucose meter kit and supplies KIT Dispense based on patient and insurance preference. Use up to four times daily as directed. 1 each 0   Blood Glucose Monitoring Suppl DEVI Use to test blood sugar up to twice daily. May substitute to any manufacturer covered by patient's insurance. E11.0 1 each 0   Glucose Blood (BLOOD GLUCOSE TEST STRIPS) STRP Use to test blood sugar up to twice  daily. May substitute to any  manufacturer covered by patient's insurance. E11.0 100 strip 3   Lancet Device MISC Use to test blood sugar up to twice daily. May substitute to any manufacturer covered by patient's insurance. E11.0 1 each 0   Lancets Misc. MISC Use to test blood sugar up to twice daily. May substitute to any manufacturer covered by patient's insurance. E11.0 100 each 3   Current Facility-Administered Medications  Medication Dose Route Frequency Provider Last Rate Last Admin   [START ON 10/30/2024] denosumab  (PROLIA ) injection 60 mg  60 mg Subcutaneous Once Cox, Kirsten, MD         ASSESSMENT & PLAN:   Assessment/Plan:  Paul [name] is a 78 y.o. male with chronic iron deficiency anemia which may be multifactorial due to chronic GI blood loss, decreased absorption, and/or inadequate dietary intake.  He had been taking oral iron for many years, but then discontinued this.  He has resumed ferrous sulfate  325 mg daily and is tolerating it without difficulty.  Stool Hemoccults were negative x 3.  Typically, we would recommend GI evaluation, but given his other medical comorbidities this may not be an option and he is reluctant to consider this.  I discussed the possibility of IV iron with the patient and his wife.  We reviewed the rare but serious side effect of allergic reaction, lowering blood pressure and shortness of breath, as well as more common side effects of flushing, headache, nausea, joint or muscle pain and pain, redness or irritation at the injection/infusion site.  The patient was provided an opportunity to ask questions and all were answered.  The patient would like to continue oral iron and follow-up with Dr. Sherre as scheduled and plan to see me back if he does not respond to oral iron.  Thank you for the referral.   45 minutes was spent in patient care.  This included time spent preparing to see the patient (e.g., review of tests), obtaining and/or reviewing separately obtained history, counseling and  educating the patient/family/caregiver, ordering medications, tests, or procedures; documenting clinical information in the electronic or other health record, independently interpreting results and communicating results to the patient/family/caregiver as well as coordination of care.      Andrez DELENA Foy, PA-C   Physician Assistant [facility] [place] [phone]

## 2024-07-26 ENCOUNTER — Inpatient Hospital Stay: Attending: Hematology and Oncology | Admitting: Hematology and Oncology

## 2024-07-26 ENCOUNTER — Inpatient Hospital Stay

## 2024-07-26 ENCOUNTER — Encounter: Payer: Self-pay | Admitting: Hematology and Oncology

## 2024-07-26 VITALS — BP 123/57 | HR 101 | Temp 98.5°F | Resp 20 | Ht 65.0 in | Wt 161.9 lb

## 2024-07-26 DIAGNOSIS — J449 Chronic obstructive pulmonary disease, unspecified: Secondary | ICD-10-CM | POA: Insufficient documentation

## 2024-07-26 DIAGNOSIS — D509 Iron deficiency anemia, unspecified: Secondary | ICD-10-CM | POA: Insufficient documentation

## 2024-07-26 DIAGNOSIS — Z87891 Personal history of nicotine dependence: Secondary | ICD-10-CM | POA: Diagnosis not present

## 2024-07-26 DIAGNOSIS — I10 Essential (primary) hypertension: Secondary | ICD-10-CM | POA: Insufficient documentation

## 2024-07-26 DIAGNOSIS — E119 Type 2 diabetes mellitus without complications: Secondary | ICD-10-CM | POA: Diagnosis not present

## 2024-07-26 DIAGNOSIS — Z9981 Dependence on supplemental oxygen: Secondary | ICD-10-CM | POA: Diagnosis not present

## 2024-07-26 LAB — CBC WITH DIFFERENTIAL (CANCER CENTER ONLY)
Abs Immature Granulocytes: 0.06 K/uL (ref 0.00–0.07)
Basophils Absolute: 0.1 K/uL (ref 0.0–0.1)
Basophils Relative: 0 %
Eosinophils Absolute: 0.3 K/uL (ref 0.0–0.5)
Eosinophils Relative: 2 %
HCT: 37.7 % — ABNORMAL LOW (ref 39.0–52.0)
Hemoglobin: 11.1 g/dL — ABNORMAL LOW (ref 13.0–17.0)
Immature Granulocytes: 0 %
Lymphocytes Relative: 11 %
Lymphs Abs: 1.9 K/uL (ref 0.7–4.0)
MCH: 21.3 pg — ABNORMAL LOW (ref 26.0–34.0)
MCHC: 29.4 g/dL — ABNORMAL LOW (ref 30.0–36.0)
MCV: 72.2 fL — ABNORMAL LOW (ref 80.0–100.0)
Monocytes Absolute: 1.5 K/uL — ABNORMAL HIGH (ref 0.1–1.0)
Monocytes Relative: 8 %
Neutro Abs: 14.4 K/uL — ABNORMAL HIGH (ref 1.7–7.7)
Neutrophils Relative %: 79 %
Platelet Count: 275 K/uL (ref 150–400)
RBC: 5.22 MIL/uL (ref 4.22–5.81)
RDW: 19.2 % — ABNORMAL HIGH (ref 11.5–15.5)
WBC Count: 18.2 K/uL — ABNORMAL HIGH (ref 4.0–10.5)
nRBC: 0 % (ref 0.0–0.2)

## 2024-07-26 LAB — VITAMIN B12: Vitamin B-12: 273 pg/mL (ref 180–914)

## 2024-07-26 LAB — TECHNOLOGIST SMEAR REVIEW: Plt Morphology: NORMAL

## 2024-07-30 ENCOUNTER — Telehealth: Payer: Self-pay

## 2024-07-30 ENCOUNTER — Encounter: Payer: Self-pay | Admitting: Internal Medicine

## 2024-07-30 ENCOUNTER — Ambulatory Visit: Admitting: Internal Medicine

## 2024-07-30 VITALS — BP 147/54 | HR 79 | Temp 97.6°F | Ht 65.0 in | Wt 160.6 lb

## 2024-07-30 DIAGNOSIS — J449 Chronic obstructive pulmonary disease, unspecified: Secondary | ICD-10-CM

## 2024-07-30 DIAGNOSIS — J9611 Chronic respiratory failure with hypoxia: Secondary | ICD-10-CM | POA: Diagnosis not present

## 2024-07-30 DIAGNOSIS — Z87891 Personal history of nicotine dependence: Secondary | ICD-10-CM | POA: Diagnosis not present

## 2024-07-30 NOTE — Patient Instructions (Addendum)
 We will submit your records for approval of Nucala   My office will be contacting you by phone for referral to lung cancer screening   [phone]) - if you don't hear back from my office within one week,  please call us  back or notify us  thru MyChart and we'll address it right away.    Ceiling on prednisone  is 20 mg per day and floor is 5 mg    Please schedule a follow up visit in 3 months but call sooner if needed with Tammy P

## 2024-07-30 NOTE — Assessment & Plan Note (Addendum)
 Low-dose CT lung cancer screening is recommended for patients who are 43-79 years of age with a 20+ pack-year history of smoking and who are currently smoking or quit <=15 years ago. No coughing up blood  No unintentional weight loss of > 15 pounds in the last 6 months - pt is eligible for scanning yearly until age 20 > referred   Discussed in detail all the  indications, usual  risks and alternatives  relative to the benefits with patient who agrees to proceed with w/u as outlined.

## 2024-07-30 NOTE — Assessment & Plan Note (Addendum)
 GOLD 3 COPD/ Quit smoking 01/2009/MM  02/04/12 Spiro: FEV1 36% FVC 57% FEF 25 75    11% -05/29/16  FEV1 0.83 L (31%) FEV1/FVC 0.40 FEF 25-75 0.30 L (14%) no bronchodilator response p ? rx prior, DLCO corrected 60% (hemoglobin 10.9) - 04/15/2019 flutter valve training  - alpha one screen 06/15/2019  MM  Level 178 - 12/27/2019 added ppi bid ac otc for atypical spells of sob  - 01/27/2020 changed lopressor  to bisoprolol  due to refractory wheeze/ sob on lopressor  100 mg daily -  Approx 01/2020 started daily prednisone   - Sinus CT  02/03/20 1. Minimal paranasal sinus mucosal thickening CT chest 02/03/20 1. Coronary artery calcifications are noted suggesting coronary artery disease. 2. Emphysematous disease is noted in the upper lobes bilaterally. 3. Interval development of several old thoracic compression fractures since prior exam. No definite acute fracture is noted.  - 02/24/20 trial of daliresp  250 > could not tol   - 02/19/2021  After extensive coaching inhaler device,  effectiveness =   80% > try change to breztri   - PFT's  02/16/24  FEV1 0.80 (33 % ) ratio 0.49  p 0 % improvement from saba p spiriva  prior to study with DLCO  8.8 (41%)   and FV curve classic severe concavity     - 07/30/2024 added nucala in effort to reduce pred with celing 20 and floor 5 mg    Group D (now reclassified as E) in terms of symptom/risk and laba/lama/ICS  therefore appropriate rx at this point >>>  breztri  and now adding nucala to reduce dep on pred for doe /exacerbations  Discussed in detail all the  indications, usual  risks and alternatives  relative to the benefits with patient who agrees to proceed with Rx as outlined.

## 2024-07-30 NOTE — Telephone Encounter (Signed)
 Nucala paperwork handed into pharmacy

## 2024-07-30 NOTE — Assessment & Plan Note (Addendum)
 Hs 02 since around 2005 - referred for best fit 05/29/2021  - as of 07/30/2024  on 2lpm hs and up to 3lpm POC but not titrating higher and rec keeps sats > 90% with exertion    Advised: Make sure you check your oxygen  saturation  AT  your highest level of activity (not after you stop)   to be sure it stays over 90% and adjust  02 flow upward to maintain this level if needed but remember to turn it back to previous settings when you stop (to conserve your supply).      Each maintenance medication was reviewed in detail including emphasizing most importantly the difference between maintenance and prns and under what circumstances the prns are to be triggered using an action plan format where appropriate.  Total time for H and P, chart review, counseling, reviewing hfa/02/ pulse ox  device(s) and generating customized AVS unique to this office visit / same day charting = 34 min

## 2024-08-02 DIAGNOSIS — L820 Inflamed seborrheic keratosis: Secondary | ICD-10-CM | POA: Diagnosis not present

## 2024-08-02 DIAGNOSIS — L814 Other melanin hyperpigmentation: Secondary | ICD-10-CM | POA: Diagnosis not present

## 2024-08-02 DIAGNOSIS — D225 Melanocytic nevi of trunk: Secondary | ICD-10-CM | POA: Diagnosis not present

## 2024-08-02 DIAGNOSIS — L578 Other skin changes due to chronic exposure to nonionizing radiation: Secondary | ICD-10-CM | POA: Diagnosis not present

## 2024-08-02 DIAGNOSIS — L570 Actinic keratosis: Secondary | ICD-10-CM | POA: Diagnosis not present

## 2024-08-04 ENCOUNTER — Telehealth: Payer: Self-pay

## 2024-08-04 ENCOUNTER — Other Ambulatory Visit (HOSPITAL_COMMUNITY): Payer: Self-pay

## 2024-08-04 NOTE — Telephone Encounter (Signed)
 Received new start paperwork for Nucala  Submitted a Prior Authorization request to [facility] for NUCALA via CoverMyMeds. Will update once we receive a response.  Key: [id number]

## 2024-08-04 NOTE — Telephone Encounter (Signed)
 Received notification from [employer] regarding a prior authorization for NUCALA. Authorization has been APPROVED from 08/04/2024 until further notice. Approval letter sent to scan center.  Per test claim, copay for 28 days supply is $8333.37  Authorization # [id number]  Please submit patient assistance application  Robecca Fulgham, PharmD, MPH, BCPS, CPP Clinical Pharmacist (Rheumatology and Pulmonology)

## 2024-08-09 NOTE — Telephone Encounter (Signed)
 Submitted Patient Assistance Application to [employer] [facility] [place] for Paul [name] along with provider portion, patient portion, PA, medication list, insurance card copy and income documents. Will update patient when we receive a response.  Phone #: [phone] Fax #: [phone]

## 2024-08-24 ENCOUNTER — Telehealth: Payer: Self-pay

## 2024-08-24 NOTE — Telephone Encounter (Signed)
 Copied from CRM [id number]. Topic: Clinical - Medication Question >> Aug 20, 2024  3:23 PM Chantha C wrote: Reason for CRM: Patient's spouse [name] [phone] states patient has been approved for Nucala. Patient insurance will cover medication 100% if it's injected in the office and filled as medical instead of prescription drug. Can this be done? Please advise and call back.   Sent to pharmacy to advise

## 2024-08-24 NOTE — Telephone Encounter (Signed)
 Copied from CRM #[id number]. Topic: General - Other >> Aug 20, 2024  3:09 PM Lavanda D wrote: Reason for CRM: Vernell with [employer] regarding update of status on Mr. Brickey, requesting to speak with [facility] or Tobias. She would like to review the results of a benefits investigation for him. Please call back @ [phone]

## 2024-08-25 NOTE — Telephone Encounter (Signed)
 Copied from CRM #[id number]. Topic: General - Other >> Aug 25, 2024 11:33 AM Devaughn RAMAN wrote: Reason for CRM: Warren with [employer]ucala called and requested to speak with the medical staff of Dr.Wert to review the summary of benefits. Contacted CAL, unable to speak with anyone. Warren stated she can be contacted at [phone].

## 2024-08-26 ENCOUNTER — Emergency Department (HOSPITAL_BASED_OUTPATIENT_CLINIC_OR_DEPARTMENT_OTHER)
Admission: EM | Admit: 2024-08-26 | Discharge: 2024-08-26 | Disposition: A | Discharge status: Home / Self Care | Attending: Emergency Medicine | Admitting: Emergency Medicine

## 2024-08-26 ENCOUNTER — Encounter (HOSPITAL_BASED_OUTPATIENT_CLINIC_OR_DEPARTMENT_OTHER): Payer: Self-pay | Admitting: Emergency Medicine

## 2024-08-26 ENCOUNTER — Other Ambulatory Visit: Payer: Self-pay

## 2024-08-26 ENCOUNTER — Emergency Department (HOSPITAL_BASED_OUTPATIENT_CLINIC_OR_DEPARTMENT_OTHER)

## 2024-08-26 DIAGNOSIS — I10 Essential (primary) hypertension: Secondary | ICD-10-CM | POA: Diagnosis not present

## 2024-08-26 DIAGNOSIS — J441 Chronic obstructive pulmonary disease with (acute) exacerbation: Secondary | ICD-10-CM | POA: Diagnosis not present

## 2024-08-26 DIAGNOSIS — E119 Type 2 diabetes mellitus without complications: Secondary | ICD-10-CM | POA: Insufficient documentation

## 2024-08-26 DIAGNOSIS — Z8616 Personal history of COVID-19: Secondary | ICD-10-CM | POA: Diagnosis not present

## 2024-08-26 DIAGNOSIS — R0602 Shortness of breath: Secondary | ICD-10-CM | POA: Diagnosis not present

## 2024-08-26 LAB — PRO BRAIN NATRIURETIC PEPTIDE: Pro Brain Natriuretic Peptide: 250 pg/mL (ref <300.0)

## 2024-08-26 LAB — BASIC METABOLIC PANEL WITH GFR
Anion gap: 14 (ref 5–15)
BUN: 15 mg/dL (ref 8–23)
CO2: 27 mmol/L (ref 22–32)
Calcium: 9.7 mg/dL (ref 8.9–10.3)
Chloride: 101 mmol/L (ref 98–111)
Creatinine, Ser: 0.93 mg/dL (ref 0.61–1.24)
GFR, Estimated: >60 mL/min (ref >60)
Glucose, Bld: 120 mg/dL — ABNORMAL HIGH (ref 70–99)
Potassium: 4.6 mmol/L (ref 3.5–5.1)
Sodium: 142 mmol/L (ref 135–145)

## 2024-08-26 LAB — TROPONIN T, HIGH SENSITIVITY: Troponin T High Sensitivity: <15 ng/L (ref 0–19)

## 2024-08-26 LAB — CBC
HCT: 41.1 % (ref 39.0–52.0)
Hemoglobin: 12 g/dL — ABNORMAL LOW (ref 13.0–17.0)
MCH: 22.2 pg — ABNORMAL LOW (ref 26.0–34.0)
MCHC: 29.2 g/dL — ABNORMAL LOW (ref 30.0–36.0)
MCV: 76 fL — ABNORMAL LOW (ref 80.0–100.0)
Platelets: 264 K/uL (ref 150–400)
RBC: 5.41 MIL/uL (ref 4.22–5.81)
RDW: 21.7 % — ABNORMAL HIGH (ref 11.5–15.5)
WBC: 12.2 K/uL — ABNORMAL HIGH (ref 4.0–10.5)
nRBC: 0 % (ref 0.0–0.2)

## 2024-08-26 MED ORDER — IPRATROPIUM-ALBUTEROL 0.5-2.5 (3) MG/3ML IN SOLN
RESPIRATORY_TRACT | Status: AC
  Administered 2024-08-26: 3 mL
  Filled 2024-08-26: qty 3

## 2024-08-26 MED ORDER — METHYLPREDNISOLONE SODIUM SUCC 125 MG IJ SOLR
125.0000 mg | Freq: Once | INTRAMUSCULAR | Status: AC
  Administered 2024-08-26: 125 mg via INTRAVENOUS
  Filled 2024-08-26: qty 2

## 2024-08-26 MED ORDER — PREDNISONE 20 MG PO TABS
40.0000 mg | ORAL_TABLET | Freq: Every day | ORAL | 0 refills | Status: AC
Start: 2024-08-26 — End: 2024-08-30

## 2024-08-26 MED ORDER — ALBUTEROL SULFATE (2.5 MG/3ML) 0.083% IN NEBU
INHALATION_SOLUTION | RESPIRATORY_TRACT | Status: AC
  Administered 2024-08-26: 2.5 mg
  Filled 2024-08-26: qty 3

## 2024-08-26 NOTE — ED Provider Notes (Signed)
 Emergency Department Provider Note   I have reviewed the triage vital signs and the nursing notes.   HISTORY  Chief Complaint Shortness of Breath   HPI Paul [name] is a 78 y.o. male with past history of COPD and diabetes presents to the emergency department with shortness of breath and wheezing.  Patient has a Paul [name] history of COPD with multiple hospitalizations.  Patient began feeling short of breath today with wheezing.  No significant chest discomfort.  No fevers or chills.  No URI symptoms.  No significant leg swelling.  Patient states that he has these exacerbations frequently.  He is on baseline prednisone  and compliant with this.    Past Medical History:  Diagnosis Date   Anemia    Aortic atherosclerosis (HCC)    Aspiration pneumonia due to inhalation of vomitus (HCC) 02/03/2022   Atrial fibrillation (HCC)    Back pain    COPD (chronic obstructive pulmonary disease) (HCC)    COPD exacerbation (HCC) 12/27/2021   COVID-19 12/24/2020   Diabetes mellitus without complication (HCC)    Dizziness 04/08/2022   Elevated PSA    GAD (generalized anxiety disorder)    GERD (gastroesophageal reflux disease)    High cholesterol    Hypertension    Lingular pneumonia 01/05/2021   See cxr 01/06/20 rx with zpak/ cefipime x 5 days and then developed purulent sputum again 01/12/21 so rx levcaquin 500 mg daily x 7 days then return for cxr before more    Lung nodule < 6cm on CT 05/29/2016   Osteoporosis    Oxygen  deficiency    Pneumonia    January 2022   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 Cindie with a 20 mm Watchman FLX device   RSV bronchitis 01/27/2022   Vertebral compression fracture (HCC) 05/29/2019   T8 compression fracture noted on CT scan 05/28/2019 inpatient with chronic steroid dependent COPD - rx calcitonin nasal spray rx per PCP     Review of Systems  Constitutional: No fever/chills Cardiovascular: Denies chest  pain. Respiratory: Positive shortness of breath. Gastrointestinal: No abdominal pain.  Neurological: Negative for headaches.  ____________________________________________   PHYSICAL EXAM:  VITAL SIGNS: ED Triage Vitals  Encounter Vitals Group     BP 08/26/24 1513 (!) 157/68     Pulse Rate 08/26/24 1513 77     Resp 08/26/24 1513 20     Temp 08/26/24 1513 98.4 F (36.9 C)     Temp src --      SpO2 08/26/24 1513 95 %     Weight 08/26/24 1512 159 lb (72.1 kg)     Height 08/26/24 1512 5' 5 (1.651 m)   Constitutional: Alert and oriented.  Increased work of breathing noted on arrival. Eyes: Conjunctivae are normal. Head: Atraumatic. Nose: No congestion/rhinnorhea. Mouth/Throat: Mucous membranes are moist.  Neck: No stridor.   Cardiovascular: Normal rate, regular rhythm. Good peripheral circulation. Grossly normal heart sounds.   Respiratory: Increased respiratory effort.  No retractions. Lungs with bilateral end expiratory wheezing. Gastrointestinal: No distention.  Musculoskeletal: No gross deformities of extremities. Neurologic:  Normal speech and language.  Skin:  Skin is warm, dry and intact. No rash noted.  ____________________________________________   LABS (all labs ordered are listed, but only abnormal results are displayed)  Labs Reviewed  BASIC METABOLIC PANEL WITH GFR - Abnormal; Notable for the following components:      Result Value   Glucose, Bld 120 (*)  All other components within normal limits  CBC - Abnormal; Notable for the following components:   WBC 12.2 (*)    Hemoglobin 12.0 (*)    MCV 76.0 (*)    MCH 22.2 (*)    MCHC 29.2 (*)    RDW 21.7 (*)    All other components within normal limits  PRO BRAIN NATRIURETIC PEPTIDE  TROPONIN T, HIGH SENSITIVITY  TROPONIN T, HIGH SENSITIVITY   ____________________________________________  EKG   EKG Interpretation Date/Time:  Thursday August 26 2024 15:28:32 EDT Ventricular Rate:  78 PR  Interval:  126 QRS Duration:  111 QT Interval:  369 QTC Calculation: 421 R Axis:   62  Text Interpretation: Sinus rhythm Borderline low voltage, extremity leads Abnormal R-wave progression, early transition Confirmed by Darra Chew [phone]) on 08/26/2024 3:31:03 PM        ____________________________________________  RADIOLOGY  DG Chest 2 View Result Date: 08/26/2024 CLINICAL DATA:  Shortness of breath EXAM: CHEST - 2 VIEW COMPARISON:  05/29/2023 FINDINGS: Atherosclerotic calcification of the aortic arch. Thoracic spondylosis. Heart size within normal limits. Mild biapical pleuroparenchymal scarring. The lungs appear otherwise clear. No blunting of the costophrenic angles. Compression deformities at T4, T5, T7, T8, T9, T10, T12, and L1 similar to the CT from 08/20/2023. IMPRESSION: 1. No acute cardiopulmonary disease is radiographically apparent. 2. Multiple thoracic and lumbar compression fractures similar to the CT from 08/20/2023. 3. Thoracic spondylosis. Electronically Signed   By: Ryan Salvage M.D.   On: 08/26/2024 16:21    ____________________________________________   PROCEDURES  Procedure(s) performed:   Procedures  CRITICAL CARE Performed by: Chew KANDICE Darra Total critical care time: 35 minutes Critical care time was exclusive of separately billable procedures and treating other patients. Critical care was necessary to treat or prevent imminent or life-threatening deterioration. Critical care was time spent personally by me on the following activities: development of treatment plan with patient and/or surrogate as well as nursing, discussions with consultants, evaluation of patient's response to treatment, examination of patient, obtaining history from patient or surrogate, ordering and performing treatments and interventions, ordering and review of laboratory studies, ordering and review of radiographic studies, pulse oximetry and re-evaluation of patient's  condition.  Chew Darra, MD Emergency Medicine  ____________________________________________   INITIAL IMPRESSION / ASSESSMENT AND PLAN / ED COURSE  Pertinent labs & imaging results that were available during my care of the patient were reviewed by me and considered in my medical decision making (see chart for details).   This patient is Presenting for Evaluation of SOB, which does require a range of treatment options, and is a complaint that involves a high risk of morbidity and mortality.  The Differential Diagnoses  includes but is not exclusive to COPD exacerbation, acute coronary syndrome, aortic dissection, pulmonary embolism, cardiac tamponade, community-acquired pneumonia, pericarditis, musculoskeletal chest wall pain, etc.   Critical Interventions-    Medications  ipratropium-albuterol  (DUONEB) 0.5-2.5 (3) MG/3ML nebulizer solution (3 mLs  Given 08/26/24 1518)  albuterol  (PROVENTIL ) (2.5 MG/3ML) 0.083% nebulizer solution (2.5 mg  Given 08/26/24 1518)  methylPREDNISolone  sodium succinate  (SOLU-MEDROL ) 125 mg/2 mL injection 125 mg (125 mg Intravenous Given 08/26/24 1539)    Reassessment after intervention:  symptoms improved.    I did obtain Additional Historical Information from wife at bedside.   I decided to review pertinent External Data, and in summary patient with multiple ED presentations for similar.   Clinical Laboratory Tests Ordered, included CBC with mild leukocytosis to 12.2.  Troponin and BNP normal.  Basic metabolic panel negative.    Radiologic Tests Ordered, included CXR. I independently interpreted the images and agree with radiology interpretation.   Cardiac Monitor Tracing which shows NSR.    Social Determinants of Health Risk patient is not an active smoker.   Medical Decision Making: Summary:  The patient presents emergency department for evaluation of shortness of breath.  Seems consistent with COPD exacerbation.  Plan for nebs, steroids, ED workup and  reassess.  Reevaluation with update and discussion with patient.  Lungs are clear.  Patient looking very well.  On room air with O2 sat in the 97 range.  Plan for discharge home at this time with steroid burst and return to his normal prednisone  dosing afterward.  He has MDI and other treatments at home.  Discussed tricked ED return precautions.  Considered admission but symptoms rapidly improved with treatment in the ED.  Stable for discharge.   Patient's presentation is most consistent with acute presentation with potential threat to life or bodily function.   Disposition: discharge  ____________________________________________  FINAL CLINICAL IMPRESSION(S) / ED DIAGNOSES  Final diagnoses:  COPD exacerbation (HCC)     NEW OUTPATIENT MEDICATIONS STARTED DURING THIS VISIT:  New Prescriptions   PREDNISONE  (DELTASONE ) 20 MG TABLET    Take 2 tablets (40 mg total) by mouth daily for 4 days.    Note:  This document was prepared using Dragon voice recognition software and may include unintentional dictation errors.  Fonda Law, MD, [facility] Emergency Medicine    Miquela Costabile, Fonda MATSU, MD 08/26/24 [phone]

## 2024-08-26 NOTE — ED Notes (Signed)
 Patient SAT dropped to 82% with transfer from wheelchair. Neb started and SAT returned to 95%. RT to monitor.

## 2024-08-26 NOTE — Discharge Instructions (Signed)
 You were seen emerged from today for shortness of breath.  I suspect that you are having a COPD exacerbation.  We are sending you home on an increased dose of steroid for the next 4 days.  Please return to your normal steroid dosing afterwards.  Continue your home breathing treatments as needed and return with any new or suddenly worsening symptoms.

## 2024-08-26 NOTE — ED Triage Notes (Signed)
 Pt reports hx of COPD with O2 PRN, started having SHoB and wheezing this AM  RT in triage to assess, reports wheezes bilaterally

## 2024-08-30 ENCOUNTER — Other Ambulatory Visit (HOSPITAL_COMMUNITY): Payer: Self-pay

## 2024-08-30 ENCOUNTER — Other Ambulatory Visit: Payer: Self-pay

## 2024-08-30 ENCOUNTER — Encounter: Payer: Self-pay | Admitting: Family Medicine

## 2024-08-30 ENCOUNTER — Ambulatory Visit (INDEPENDENT_AMBULATORY_CARE_PROVIDER_SITE_OTHER): Admitting: Family Medicine

## 2024-08-30 ENCOUNTER — Telehealth: Payer: Self-pay

## 2024-08-30 ENCOUNTER — Telehealth: Payer: Self-pay | Admitting: *Deleted

## 2024-08-30 VITALS — BP 138/68 | HR 55 | Temp 97.7°F | Resp 20 | Ht 65.0 in | Wt 164.0 lb

## 2024-08-30 DIAGNOSIS — J441 Chronic obstructive pulmonary disease with (acute) exacerbation: Secondary | ICD-10-CM | POA: Insufficient documentation

## 2024-08-30 NOTE — Telephone Encounter (Signed)
 Please advise as to how pt will need to taper prednisone  before starting Nucala  on 9/11?  Last ov with MW  We will submit your records for approval of Nucala     My office will be contacting you by phone for referral to lung cancer screening   [phone] - if you don't hear back from my office within one week,  please call us  back or notify us  thru MyChart and we'll address it right away.     Ceiling on prednisone  is 20 mg per day and floor is 5 mg      Please schedule a follow up visit in 3 months but call sooner if needed with Tammy P

## 2024-08-30 NOTE — Telephone Encounter (Signed)
 Pharmacy team aware - referral to [facility] will be placed for Nucala 

## 2024-08-30 NOTE — Progress Notes (Signed)
 Subjective:  Patient ID: Paul [name], male    DOB: 1946/03/16  Age: 78 y.o. MRN: [id number]  Chief Complaint  Patient presents with   Medical Management of Chronic Issues   Hospitalization Follow-up   Discussed the use of AI scribe software for clinical note transcription with the patient, who gave verbal consent to proceed.  History of Present Illness   Paul [name] is a 78 year old male with COPD who presents for a hospital follow-up after experiencing shortness of breath.  He was recently hospitalized due to shortness of breath on exertion, a common symptom associated with his COPD. During the hospital stay, he underwent a chest x-ray and blood work. The chest x-ray was normal, while blood work revealed a mildly elevated white blood cell count at 12.2. Troponins and BMP were normal. His oxygen  levels were low, necessitating breathing treatments, steroids, and IV fluids. No antibiotics were administered, and by discharge, his wheezing had improved.  He describes his breathing difficulties as sudden and unpredictable, stating it 'just hits him all the time,' although he woke up feeling good today. He has not been hospitalized for over a year, which marks a significant improvement from his previous pattern of five hospitalizations per year. His last hospital discharge was in March 2024.  He is currently on 10 mg prednisone  once daily at breakfast.          07/26/2024    8:34 AM 04/01/2024   10:35 AM 05/14/2023   11:23 AM 04/01/2023   11:15 AM 03/26/2023    8:54 AM  Depression screen PHQ 2/9  Decreased Interest 0 0 0 0 0  Down, Depressed, Hopeless 0 0 0 0 0  PHQ - 2 Score 0 0 0 0 0        05/20/2024    8:57 AM  Fall Risk   Falls in the past year? 0    Patient Care Team: [name], MD as PCP - General (Family Medicine) Jeffrie Oneil BROCKS, MD as PCP - Cardiology (Cardiology) Cindie Ole DASEN, MD as PCP - Electrophysiology (Cardiology) Brien Belvie FORBES, MD as Attending Physician  (Pulmonary Disease) Darlean Ozell NOVAK, MD as Consulting Physician (Pulmonary Disease) [facility] as Registered Nurse [facility] and Palliative Medicine) Carlin Delon BROCKS, NP as Nurse Practitioner (Cardiology)   Review of Systems  Constitutional:  Negative for appetite change, fatigue and fever.  HENT:  Negative for congestion, ear pain, sinus pressure and sore throat.   Eyes: Negative.   Respiratory:  Positive for shortness of breath. Negative for cough, chest tightness and wheezing.   Cardiovascular:  Negative for chest pain and palpitations.  Gastrointestinal:  Negative for abdominal pain, constipation, diarrhea, nausea and vomiting.  Endocrine: Negative.   Genitourinary:  Negative for dysuria, frequency, hematuria and urgency.  Musculoskeletal:  Negative for arthralgias, back pain, joint swelling and myalgias.  Skin:  Negative for rash.  Allergic/Immunologic: Negative.   Neurological:  Positive for light-headedness. Negative for dizziness, weakness and headaches.  Psychiatric/Behavioral:  Negative for dysphoric mood. The patient is not nervous/anxious.     Current Outpatient Medications on File Prior to Visit  Medication Sig Dispense Refill   Accu-Chek Softclix Lancets lancets Check fasting sugar once daily. 100 each 3   acetaminophen  (TYLENOL ) 500 MG tablet Take 1,000 mg by mouth at bedtime as needed (pain).     albuterol  (PROAIR  HFA) 108 (90 Base) MCG/ACT inhaler Inhale 2 puffs into the lungs as needed for wheezing or shortness of  breath.     albuterol  (PROVENTIL ) (2.5 MG/3ML) 0.083% nebulizer solution Take 3 mLs (2.5 mg total) by nebulization every 6 (six) hours as needed for shortness of breath or wheezing. 360 mL 5   ALPRAZolam  (XANAX ) 0.5 MG tablet Take 1 tablet (0.5 mg total) by mouth 2 (two) times daily as needed for anxiety. 30 tablet 0   amLODipine  (NORVASC ) 2.5 MG tablet Take 2.5 mg by mouth daily.     Ascorbic Acid  (VITAMIN C ) 1000 MG tablet Take 1,000 mg by  mouth daily.     aspirin  EC 81 MG tablet Take 1 tablet (81 mg total) by mouth daily. Swallow whole. 90 tablet 3   atorvastatin  (LIPITOR) 40 MG tablet TAKE 1 TABLET BY MOUTH EVERYDAY AT BEDTIME 90 tablet 1   blood glucose meter kit and supplies KIT Dispense based on patient and insurance preference. Use up to four times daily as directed. 1 each 0   Blood Glucose Monitoring Suppl DEVI Use to test blood sugar up to twice daily. May substitute to any manufacturer covered by patient's insurance. E11.0 1 each 0   budesonide -glycopyrrolate-formoterol  (BREZTRI  AEROSPHERE) 160-9-4.8 MCG/ACT AERO inhaler Inhale 2 puffs into the lungs 2 (two) times daily. 3 each 4   calcium  carbonate (OS-CAL) 600 MG TABS tablet Take 600 mg by mouth daily.     Cholecalciferol  (VITAMIN D3) 50 MCG (2000 UT) TABS Take 2,000 Units by mouth in the morning.     dapagliflozin  propanediol (FARXIGA ) 10 MG TABS tablet Take 1 tablet (10 mg total) by mouth daily. 90 tablet 4   denosumab  (PROLIA ) 60 MG/ML SOSY injection Inject 60 mg into the skin every 6 (six) months. 180 mL 0   digoxin  (LANOXIN ) 0.125 MG tablet TAKE 1 TABLET BY MOUTH DAILY 90 tablet 2   ferrous sulfate  325 (65 FE) MG tablet Take 325 mg by mouth 2 (two) times daily with a meal. (Patient taking differently: Take 325 mg by mouth daily with breakfast.)     fluticasone  (FLONASE ) 50 MCG/ACT nasal spray Place 2 sprays into both nostrils daily as needed for allergies or rhinitis.     furosemide  (LASIX ) 40 MG tablet Take 1 tablet by mouth daily, may take 1 extra tablet daily only as needed for weight gain or lower extremity swelling     Glucose Blood (BLOOD GLUCOSE TEST STRIPS) STRP Use to test blood sugar up to twice daily. May substitute to any manufacturer covered by patient's insurance. E11.0 100 strip 3   Lancet Device MISC Use to test blood sugar up to twice daily. May substitute to any manufacturer covered by patient's insurance. E11.0 1 each 0   Lancets Misc. MISC Use to  test blood sugar up to twice daily. May substitute to any manufacturer covered by patient's insurance. E11.0 100 each 3   levothyroxine  (SYNTHROID ) 25 MCG tablet Take 1 tablet (25 mcg total) by mouth daily. 90 tablet 3   loratadine  (CLARITIN ) 10 MG tablet Take 10 mg by mouth in the morning.     Melatonin 12 MG TABS Take 12 mg by mouth at bedtime.     Multiple Vitamin (MULTIVITAMIN WITH MINERALS) TABS tablet Take 1 tablet by mouth daily with breakfast.     OXYGEN  Inhale 2 L/min into the lungs at bedtime.     pantoprazole  (PROTONIX ) 40 MG tablet Take 1 tablet (40 mg total) by mouth 2 (two) times daily. 180 tablet 3   predniSONE  (DELTASONE ) 10 MG tablet TAKE 1 TABLET (10 MG TOTAL) BY MOUTH  DAILY WITH BREAKFAST. 90 tablet 1   predniSONE  (DELTASONE ) 20 MG tablet Take 2 tablets (40 mg total) by mouth daily for 4 days. 8 tablet 0   Probiotic Product (PROBIOTIC DAILY) CAPS Take 1 capsule by mouth in the morning.     sertraline  (ZOLOFT ) 50 MG tablet TAKE 1 TABLET BY MOUTH EVERYDAY AT BEDTIME 90 tablet 1   Current Facility-Administered Medications on File Prior to Visit  Medication Dose Route Frequency Provider Last Rate Last Admin   [START ON 10/30/2024] denosumab  (PROLIA ) injection 60 mg  60 mg Subcutaneous Once [name], MD       Past Medical History:  Diagnosis Date   Anemia    Aortic atherosclerosis (HCC)    Aspiration pneumonia due to inhalation of vomitus (HCC) 02/03/2022   Atrial fibrillation (HCC)    Back pain    COPD (chronic obstructive pulmonary disease) (HCC)    COPD exacerbation (HCC) 12/27/2021   COVID-19 12/24/2020   Diabetes mellitus without complication (HCC)    Dizziness 04/08/2022   Elevated PSA    GAD (generalized anxiety disorder)    GERD (gastroesophageal reflux disease)    High cholesterol    Hypertension    Lingular pneumonia 01/05/2021   See cxr 01/06/20 rx with zpak/ cefipime x 5 days and then developed purulent sputum again 01/12/21 so rx levcaquin 500 mg daily x  7 days then return for cxr before more    Lung nodule < 6cm on CT 05/29/2016   Osteoporosis    Oxygen  deficiency    Pneumonia    January 2022   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 Cindie with a 20 mm Watchman FLX device   RSV bronchitis 01/27/2022   Vertebral compression fracture (HCC) 05/29/2019   T8 compression fracture noted on CT scan 05/28/2019 inpatient with chronic steroid dependent COPD - rx calcitonin nasal spray rx per PCP    Past Surgical History:  Procedure Laterality Date   CATARACT EXTRACTION Right    ELBOW SURGERY  07/2022   surgery on olecranon bursa. Dr. Alyse   IR KYPHO EA ADDL LEVEL THORACIC OR LUMBAR  11/28/2021   LEFT ATRIAL APPENDAGE OCCLUSION N/A 08/02/2021   Procedure: LEFT ATRIAL APPENDAGE OCCLUSION;  Surgeon: Wonda Sharper, MD;  [facility]VAS[facility] CV LAB;  Service: Cardiovascular;  Laterality: N/A;   SKIN SURGERY     shoulders and chest   TEE WITHOUT CARDIOVERSION N/A 08/02/2021   Procedure: TRANSESOPHAGEAL ECHOCARDIOGRAM (TEE);  Surgeon: Wonda Sharper, MD;  Location: [facility] INVASIVE CV LAB;  Service: Cardiovascular;  Laterality: N/A;   TEE WITHOUT CARDIOVERSION N/A 09/13/2021   Procedure: TRANSESOPHAGEAL ECHOCARDIOGRAM (TEE);  Surgeon: Francyne Headland, MD;  Location: [facility] ENDOSCOPY;  Service: Cardiovascular;  Laterality: N/A;    Family History  Problem Relation Age of Onset   Heart disease Brother    Heart disease Mother    Heart disease Father    Cancer Paternal Uncle    Social History   Socioeconomic History   Marital status: Married    Spouse name: Not on file   Number of children: 3   Years of education: Not on file   Highest education level: 10th grade  Occupational History   Occupation: retired    Comment: truck Hospital doctor  Tobacco Use   Smoking status: Former    Current packs/day: 0.00    Average packs/day: 2.0 packs/day for 52.0 years (104.0 ttl pk-yrs)    Types: Cigarettes  Start  date: 02/03/1957    Quit date: 02/03/2009    Years since quitting: 15.5   Smokeless tobacco: Never   Tobacco comments:    Counseled to remain smoke free  Vaping Use   Vaping status: Never Used  Substance and Sexual Activity   Alcohol use: Not Currently    Alcohol/week: 0.0 standard drinks of alcohol    Comment: occ   Drug use: No   Sexual activity: Not on file  Other Topics Concern   Not on file  Social History Narrative   Originally from [place]. Previously worked driving a Surveyor, minerals. No pets currently. No mold exposure. 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       wears sunscreen, brushes and flosses daily, see's dentist bi-annually, has smoke/carbon monoxide detectors, wears a seatbelt and practices gun safety      Social Drivers of Health   Financial Resource Strain: Low Risk  (07/19/2023)   Overall Financial Resource Strain (CARDIA)    Difficulty of Paying Living Expenses: Not hard at all  Food Insecurity: No Food Insecurity (07/26/2024)   Hunger Vital Sign    Worried About Running Out of Food in the Last Year: Never true    Ran Out of Food in the Last Year: Never true  Transportation Needs: No Transportation Needs (07/26/2024)   PRAPARE - Administrator, Civil Service (Medical): No    Lack of Transportation (Non-Medical): No  Physical Activity: Insufficiently Active (07/19/2023)   Exercise Vital Sign    Days of Exercise per Week: 3 days    Minutes of Exercise per Session: 30 min  Stress: No Stress Concern Present (07/19/2023)   [employer] of Occupational Health - Occupational Stress Questionnaire    Feeling of Stress : Not at all  Social Connections: Socially Integrated (07/19/2023)   Social Connection and Isolation Panel    Frequency of Communication with Friends and Family: More than three times a week    Frequency of Social Gatherings with Friends and Family: More than three times a week     Attends Religious Services: More than 4 times per year    Active Member of [employer] or Organizations: Yes    Attends Engineer, structural: More than 4 times per year    Marital Status: Married    Objective:  BP 138/68   Pulse (!) 55   Temp 97.7 F (36.5 C) (Temporal)   Resp 20   Ht 5' 5 (1.651 m)   Wt 164 lb (74.4 kg)   SpO2 100%   BMI 27.29 kg/m      08/30/2024    8:02 AM 08/26/2024    3:30 PM 08/26/2024    3:13 PM  BP/Weight  Systolic BP 138 145 157  Diastolic BP 68 60 68  Wt. (Lbs) 164    BMI 27.29 kg/m2      Physical Exam Vitals reviewed.  Constitutional:      General: He is not in acute distress.    Appearance: Normal appearance. He is not ill-appearing.  Eyes:     Conjunctiva/sclera: Conjunctivae normal.  Cardiovascular:     Rate and Rhythm: Normal rate and regular rhythm.     Heart sounds: Normal heart sounds. No murmur heard. Pulmonary:     Effort: No tachypnea, accessory muscle usage or respiratory distress.     Breath sounds: Decreased breath sounds present. No wheezing or rhonchi.  Abdominal:  Palpations: Abdomen is soft.  Musculoskeletal:        General: Normal range of motion.  Neurological:     Mental Status: He is alert. Mental status is at baseline.  Psychiatric:        Mood and Affect: Mood normal.        Behavior: Behavior normal.      Lab Results  Component Value Date   WBC 12.2 (H) 08/26/2024   HGB 12.0 (L) 08/26/2024   HCT 41.1 08/26/2024   PLT 264 08/26/2024   GLUCOSE 120 (H) 08/26/2024   CHOL 158 07/05/2024   TRIG 199 (H) 07/05/2024   HDL 57 07/05/2024   LDLCALC 68 07/05/2024   ALT 36 07/05/2024   AST 28 07/05/2024   NA 142 08/26/2024   K 4.6 08/26/2024   CL 101 08/26/2024   CREATININE 0.93 08/26/2024   BUN 15 08/26/2024   CO2 27 08/26/2024   TSH 3.790 05/19/2024   INR 0.9 01/25/2023   HGBA1C 6.3 (H) 07/05/2024      Assessment & Plan:  COPD with acute exacerbation (HCC) Assessment & Plan: Chronic  obstructive pulmonary disease (COPD) with recent acute exacerbation Recent COPD exacerbation treated successfully in the ED with bronchodilators, corticosteroids, and IV fluids. No antibiotics needed. Improved condition without admission. Cardiac workup and chest xray negative.  Pulse Ox reading today - 100%, lung sounds clear.  - Breztri  samples given.      Follow-up: Return if symptoms worsen or fail to improve.   I,Angela Taylor,acting as a Neurosurgeon for Harrie CHRISTELLA Cedar, FNP.,have documented all relevant documentation on the behalf of Harrie CHRISTELLA Cedar, FNP,as directed by  Harrie CHRISTELLA Cedar, FNP while in the presence of Harrie CHRISTELLA Cedar, FNP.   An After Visit Summary was printed and given to the patient.  I attest that I have reviewed this visit and agree with the plan scribed by my staff.   Harrie CHRISTELLA Cedar, FNP [facility] [phone]

## 2024-08-30 NOTE — Telephone Encounter (Signed)
 Copied from CRM [id number]. Topic: General - Other >> Aug 27, 2024 12:13 PM Paul [name] wrote: Reason for CRM: Summary of Benefits for Nucala : Paul [name]  Covered at 100% for major medical Patient indicated would like to go that route. Coverage for pharmacy is zero, patient is paying entire co-insurance (amounting to about $2000). If site does not do infusion for this, please contact for list of alternative covered sites.  PA on file for pharmacy benefits. PA for major medical on file and approved to 2099.  If any questions, please call back at C/B [phone].

## 2024-08-30 NOTE — Telephone Encounter (Signed)
 Infusion referral placed - therapy plan for Nucala  100mg  [place] every 28 days entered for [facility]. See orders only encounter 08/30/24.   Aleck Puls, PharmD, BCPS Clinical Pharmacist  [facility]

## 2024-08-30 NOTE — Telephone Encounter (Signed)
 Pt's wife had contacted office on 9/2 to inform us  that pt was covered at 100% through medical benefits, however message was never actually routed to pharmacy team. Pt's wife called again today and we discussed, she states that they are agreeable coming into clinic once a month for injections. I advised that we would send a referral over to the infusion center to get them started on in-office administration.

## 2024-08-30 NOTE — Progress Notes (Signed)
 Nucala  orders entered for patient to have administered at infusion site. Per summary of benefits, covered at 100%. See telephone note 08/30/24.   Nucala  100mg  [place] every 28 days.   Aleck Puls, PharmD, BCPS Clinical Pharmacist  [facility]

## 2024-08-30 NOTE — Assessment & Plan Note (Signed)
 Chronic obstructive pulmonary disease (COPD) with recent acute exacerbation Recent COPD exacerbation treated successfully in the ED with bronchodilators, corticosteroids, and IV fluids. No antibiotics needed. Improved condition without admission. Cardiac workup and chest xray negative.  Pulse Ox reading today - 100%, lung sounds clear.  - Breztri  samples given.

## 2024-08-31 NOTE — Telephone Encounter (Signed)
 Patient advised per MW suggestion, he verbalized unedstanding nfn.

## 2024-09-01 ENCOUNTER — Ambulatory Visit: Admitting: Family Medicine

## 2024-09-02 ENCOUNTER — Ambulatory Visit (INDEPENDENT_AMBULATORY_CARE_PROVIDER_SITE_OTHER)

## 2024-09-02 ENCOUNTER — Encounter: Payer: Self-pay | Admitting: Internal Medicine

## 2024-09-02 VITALS — BP 145/66 | HR 73 | Temp 97.8°F | Resp 20 | Ht 66.0 in | Wt 162.8 lb

## 2024-09-02 DIAGNOSIS — J449 Chronic obstructive pulmonary disease, unspecified: Secondary | ICD-10-CM

## 2024-09-02 MED ORDER — MEPOLIZUMAB 100 MG ~~LOC~~ SOLR
100.0000 mg | Freq: Once | SUBCUTANEOUS | Status: AC
  Administered 2024-09-02: 100 mg via SUBCUTANEOUS
  Filled 2024-09-02: qty 1

## 2024-09-02 NOTE — Progress Notes (Signed)
 Diagnosis: COPD  Provider:  Mannam, Praveen MD  Procedure: Injection  Nucala  (Mepolizumab ), Dose: 100 mg, Site: subcutaneous, Number of injections: 1  Injection Site(s): Left arm  Post Care: Observation period completed  Discharge: Condition: Good, Destination: Home . AVS Declined  Performed by:  Eleanor DELENA Bloch, RN

## 2024-09-03 ENCOUNTER — Telehealth: Payer: Self-pay

## 2024-09-03 NOTE — Telephone Encounter (Signed)
 Auth Submission: NO AUTH NEEDED Site of care: Site of care: [facility] Payer: Medicare A/B with BCBS supplement Medication & CPT/J Code(s) submitted: Nucala  (Mepolizumab ) G7817 Diagnosis Code:  Route of submission (phone, fax, portal):  Phone # Fax # Auth type: Buy/Bill PB Units/visits requested: 100mg  x 6 doses Reference number:  Approval from: 09/03/24 to 01/22/25

## 2024-09-03 NOTE — Telephone Encounter (Signed)
 Patient received Nucala  injection on 09/02/24 at infusion center. Next visit scheduled 09/30/24.   Closing this encounter. Pharmacy team available if needed.  Aleck Puls, PharmD, BCPS Clinical Pharmacist  [facility]

## 2024-09-06 ENCOUNTER — Inpatient Hospital Stay (HOSPITAL_BASED_OUTPATIENT_CLINIC_OR_DEPARTMENT_OTHER)
Admission: EM | Admit: 2024-09-06 | Discharge: 2024-09-11 | DRG: 191 | Disposition: A | Discharge status: Home / Self Care | Attending: Internal Medicine | Admitting: Internal Medicine

## 2024-09-06 ENCOUNTER — Emergency Department (HOSPITAL_BASED_OUTPATIENT_CLINIC_OR_DEPARTMENT_OTHER)

## 2024-09-06 ENCOUNTER — Ambulatory Visit: Payer: Self-pay | Admitting: Internal Medicine

## 2024-09-06 ENCOUNTER — Other Ambulatory Visit: Payer: Self-pay

## 2024-09-06 ENCOUNTER — Encounter (HOSPITAL_BASED_OUTPATIENT_CLINIC_OR_DEPARTMENT_OTHER): Payer: Self-pay | Admitting: *Deleted

## 2024-09-06 DIAGNOSIS — Z7952 Long term (current) use of systemic steroids: Secondary | ICD-10-CM

## 2024-09-06 DIAGNOSIS — R0602 Shortness of breath: Secondary | ICD-10-CM

## 2024-09-06 DIAGNOSIS — I10 Essential (primary) hypertension: Secondary | ICD-10-CM | POA: Diagnosis present

## 2024-09-06 DIAGNOSIS — E039 Hypothyroidism, unspecified: Secondary | ICD-10-CM | POA: Diagnosis present

## 2024-09-06 DIAGNOSIS — T380X5A Adverse effect of glucocorticoids and synthetic analogues, initial encounter: Secondary | ICD-10-CM | POA: Diagnosis not present

## 2024-09-06 DIAGNOSIS — D509 Iron deficiency anemia, unspecified: Secondary | ICD-10-CM | POA: Diagnosis present

## 2024-09-06 DIAGNOSIS — I5032 Chronic diastolic (congestive) heart failure: Secondary | ICD-10-CM | POA: Diagnosis present

## 2024-09-06 DIAGNOSIS — Z8249 Family history of ischemic heart disease and other diseases of the circulatory system: Secondary | ICD-10-CM | POA: Diagnosis not present

## 2024-09-06 DIAGNOSIS — E782 Mixed hyperlipidemia: Secondary | ICD-10-CM | POA: Diagnosis present

## 2024-09-06 DIAGNOSIS — Z7982 Long term (current) use of aspirin: Secondary | ICD-10-CM

## 2024-09-06 DIAGNOSIS — Z7984 Long term (current) use of oral hypoglycemic drugs: Secondary | ICD-10-CM

## 2024-09-06 DIAGNOSIS — E860 Dehydration: Secondary | ICD-10-CM | POA: Diagnosis present

## 2024-09-06 DIAGNOSIS — Z888 Allergy status to other drugs, medicaments and biological substances status: Secondary | ICD-10-CM

## 2024-09-06 DIAGNOSIS — J441 Chronic obstructive pulmonary disease with (acute) exacerbation: Secondary | ICD-10-CM | POA: Diagnosis not present

## 2024-09-06 DIAGNOSIS — J440 Chronic obstructive pulmonary disease with acute lower respiratory infection: Secondary | ICD-10-CM | POA: Diagnosis present

## 2024-09-06 DIAGNOSIS — E1169 Type 2 diabetes mellitus with other specified complication: Secondary | ICD-10-CM | POA: Diagnosis not present

## 2024-09-06 DIAGNOSIS — Z95818 Presence of other cardiac implants and grafts: Secondary | ICD-10-CM

## 2024-09-06 DIAGNOSIS — J9611 Chronic respiratory failure with hypoxia: Secondary | ICD-10-CM | POA: Diagnosis not present

## 2024-09-06 DIAGNOSIS — K219 Gastro-esophageal reflux disease without esophagitis: Secondary | ICD-10-CM | POA: Diagnosis present

## 2024-09-06 DIAGNOSIS — Z713 Dietary counseling and surveillance: Secondary | ICD-10-CM

## 2024-09-06 DIAGNOSIS — Z6826 Body mass index (BMI) 26.0-26.9, adult: Secondary | ICD-10-CM

## 2024-09-06 DIAGNOSIS — G4733 Obstructive sleep apnea (adult) (pediatric): Secondary | ICD-10-CM | POA: Diagnosis present

## 2024-09-06 DIAGNOSIS — Y92239 Unspecified place in hospital as the place of occurrence of the external cause: Secondary | ICD-10-CM | POA: Diagnosis not present

## 2024-09-06 DIAGNOSIS — Z8616 Personal history of COVID-19: Secondary | ICD-10-CM

## 2024-09-06 DIAGNOSIS — D849 Immunodeficiency, unspecified: Secondary | ICD-10-CM | POA: Diagnosis present

## 2024-09-06 DIAGNOSIS — Z87891 Personal history of nicotine dependence: Secondary | ICD-10-CM

## 2024-09-06 DIAGNOSIS — I480 Paroxysmal atrial fibrillation: Secondary | ICD-10-CM | POA: Diagnosis present

## 2024-09-06 DIAGNOSIS — Z881 Allergy status to other antibiotic agents status: Secondary | ICD-10-CM

## 2024-09-06 DIAGNOSIS — F411 Generalized anxiety disorder: Secondary | ICD-10-CM | POA: Diagnosis present

## 2024-09-06 DIAGNOSIS — Z9981 Dependence on supplemental oxygen: Secondary | ICD-10-CM | POA: Diagnosis not present

## 2024-09-06 DIAGNOSIS — Z79890 Hormone replacement therapy (postmenopausal): Secondary | ICD-10-CM

## 2024-09-06 DIAGNOSIS — J439 Emphysema, unspecified: Secondary | ICD-10-CM | POA: Diagnosis not present

## 2024-09-06 DIAGNOSIS — R0902 Hypoxemia: Secondary | ICD-10-CM

## 2024-09-06 DIAGNOSIS — E1159 Type 2 diabetes mellitus with other circulatory complications: Secondary | ICD-10-CM | POA: Diagnosis present

## 2024-09-06 DIAGNOSIS — R059 Cough, unspecified: Secondary | ICD-10-CM | POA: Diagnosis not present

## 2024-09-06 DIAGNOSIS — Z79899 Other long term (current) drug therapy: Secondary | ICD-10-CM

## 2024-09-06 DIAGNOSIS — I152 Hypertension secondary to endocrine disorders: Secondary | ICD-10-CM | POA: Diagnosis not present

## 2024-09-06 DIAGNOSIS — R062 Wheezing: Secondary | ICD-10-CM

## 2024-09-06 DIAGNOSIS — M810 Age-related osteoporosis without current pathological fracture: Secondary | ICD-10-CM | POA: Diagnosis present

## 2024-09-06 DIAGNOSIS — Z1152 Encounter for screening for COVID-19: Secondary | ICD-10-CM | POA: Diagnosis not present

## 2024-09-06 DIAGNOSIS — R051 Acute cough: Secondary | ICD-10-CM

## 2024-09-06 DIAGNOSIS — E872 Acidosis, unspecified: Secondary | ICD-10-CM | POA: Diagnosis present

## 2024-09-06 DIAGNOSIS — I700 Atherosclerosis of aorta: Secondary | ICD-10-CM | POA: Diagnosis not present

## 2024-09-06 DIAGNOSIS — E663 Overweight: Secondary | ICD-10-CM | POA: Diagnosis present

## 2024-09-06 LAB — RESP PANEL BY RT-PCR (RSV, FLU A&B, COVID)  RVPGX2
Influenza A by PCR: NEGATIVE
Influenza B by PCR: NEGATIVE
Resp Syncytial Virus by PCR: NEGATIVE
SARS Coronavirus 2 by RT PCR: NEGATIVE

## 2024-09-06 LAB — CBC WITH DIFFERENTIAL/PLATELET
Abs Immature Granulocytes: 0.06 K/uL (ref 0.00–0.07)
Basophils Absolute: 0 K/uL (ref 0.0–0.1)
Basophils Relative: 0 %
Eosinophils Absolute: 0 K/uL (ref 0.0–0.5)
Eosinophils Relative: 0 %
HCT: 42.4 % (ref 39.0–52.0)
Hemoglobin: 12.5 g/dL — ABNORMAL LOW (ref 13.0–17.0)
Immature Granulocytes: 1 %
Lymphocytes Relative: 9 %
Lymphs Abs: 1 K/uL (ref 0.7–4.0)
MCH: 22.6 pg — ABNORMAL LOW (ref 26.0–34.0)
MCHC: 29.5 g/dL — ABNORMAL LOW (ref 30.0–36.0)
MCV: 76.8 fL — ABNORMAL LOW (ref 80.0–100.0)
Monocytes Absolute: 0.6 K/uL (ref 0.1–1.0)
Monocytes Relative: 5 %
Neutro Abs: 9.6 K/uL — ABNORMAL HIGH (ref 1.7–7.7)
Neutrophils Relative %: 85 %
Platelets: 304 K/uL (ref 150–400)
RBC: 5.52 MIL/uL (ref 4.22–5.81)
RDW: 21.5 % — ABNORMAL HIGH (ref 11.5–15.5)
Smear Review: NORMAL
WBC: 11.2 K/uL — ABNORMAL HIGH (ref 4.0–10.5)
nRBC: 0 % (ref 0.0–0.2)

## 2024-09-06 LAB — COMPREHENSIVE METABOLIC PANEL WITH GFR
ALT: 41 U/L (ref 0–44)
AST: 29 U/L (ref 15–41)
Albumin: 4.4 g/dL (ref 3.5–5.0)
Alkaline Phosphatase: 97 U/L (ref 38–126)
Anion gap: 12 (ref 5–15)
BUN: 15 mg/dL (ref 8–23)
CO2: 29 mmol/L (ref 22–32)
Calcium: 9.6 mg/dL (ref 8.9–10.3)
Chloride: 100 mmol/L (ref 98–111)
Creatinine, Ser: 0.97 mg/dL (ref 0.61–1.24)
GFR, Estimated: >60 mL/min (ref >60)
Glucose, Bld: 140 mg/dL — ABNORMAL HIGH (ref 70–99)
Potassium: 4.7 mmol/L (ref 3.5–5.1)
Sodium: 141 mmol/L (ref 135–145)
Total Bilirubin: 0.3 mg/dL (ref 0.0–1.2)
Total Protein: 7 g/dL (ref 6.5–8.1)

## 2024-09-06 LAB — TROPONIN T, HIGH SENSITIVITY
Troponin T High Sensitivity: <15 ng/L (ref 0–19)
Troponin T High Sensitivity: <15 ng/L (ref 0–19)

## 2024-09-06 LAB — LACTIC ACID, PLASMA
Lactic Acid, Venous: 2.5 mmol/L (ref 0.5–1.9)
Lactic Acid, Venous: 2.1 mmol/L (ref 0.5–1.9)

## 2024-09-06 LAB — CBC
HCT: 40.9 % (ref 39.0–52.0)
Hemoglobin: 11.6 g/dL — ABNORMAL LOW (ref 13.0–17.0)
MCH: 22.4 pg — ABNORMAL LOW (ref 26.0–34.0)
MCHC: 28.4 g/dL — ABNORMAL LOW (ref 30.0–36.0)
MCV: 79.1 fL — ABNORMAL LOW (ref 80.0–100.0)
Platelets: 264 K/uL (ref 150–400)
RBC: 5.17 MIL/uL (ref 4.22–5.81)
RDW: 21.5 % — ABNORMAL HIGH (ref 11.5–15.5)
WBC: 11.6 K/uL — ABNORMAL HIGH (ref 4.0–10.5)
nRBC: 0 % (ref 0.0–0.2)

## 2024-09-06 LAB — CREATININE, SERUM
Creatinine, Ser: 0.95 mg/dL (ref 0.61–1.24)
GFR, Estimated: >60 mL/min (ref >60)

## 2024-09-06 MED ORDER — INSULIN ASPART 100 UNIT/ML IJ SOLN
0.0000 [IU] | Freq: Three times a day (TID) | INTRAMUSCULAR | Status: DC
  Administered 2024-09-07 – 2024-09-08 (×3): 1 [IU] via SUBCUTANEOUS

## 2024-09-06 MED ORDER — AMLODIPINE BESYLATE 5 MG PO TABS
2.5000 mg | ORAL_TABLET | Freq: Every day | ORAL | Status: DC
Start: 2024-09-07 — End: 2024-09-10
  Administered 2024-09-07 – 2024-09-09 (×3): 2.5 mg via ORAL
  Filled 2024-09-06: qty 1
  Filled 2024-09-06: qty 0.5
  Filled 2024-09-06: qty 1
  Filled 2024-09-06: qty 0.5
  Filled 2024-09-06: qty 1
  Filled 2024-09-06: qty 0.5
  Filled 2024-09-06: qty 1

## 2024-09-06 MED ORDER — DAPAGLIFLOZIN PROPANEDIOL 10 MG PO TABS
10.0000 mg | ORAL_TABLET | Freq: Every day | ORAL | Status: DC
  Administered 2024-09-07 – 2024-09-11 (×5): 10 mg via ORAL
  Filled 2024-09-06 (×7): qty 1

## 2024-09-06 MED ORDER — SODIUM CHLORIDE 0.9 % IV SOLN
100.0000 mg | Freq: Two times a day (BID) | INTRAVENOUS | Status: DC
  Administered 2024-09-06 – 2024-09-07 (×2): 100 mg via INTRAVENOUS
  Filled 2024-09-06 (×2): qty 100

## 2024-09-06 MED ORDER — ALBUTEROL SULFATE (2.5 MG/3ML) 0.083% IN NEBU
INHALATION_SOLUTION | RESPIRATORY_TRACT | Status: AC
  Administered 2024-09-06: 10 mg
  Filled 2024-09-06: qty 12

## 2024-09-06 MED ORDER — ENOXAPARIN SODIUM 40 MG/0.4ML IJ SOSY
40.0000 mg | PREFILLED_SYRINGE | INTRAMUSCULAR | Status: DC
  Administered 2024-09-06 – 2024-09-07 (×2): 40 mg via SUBCUTANEOUS
  Filled 2024-09-06 (×4): qty 0.4

## 2024-09-06 MED ORDER — IPRATROPIUM-ALBUTEROL 0.5-2.5 (3) MG/3ML IN SOLN
3.0000 mL | RESPIRATORY_TRACT | Status: DC
  Administered 2024-09-07 (×3): 3 mL via RESPIRATORY_TRACT
  Filled 2024-09-06 (×3): qty 3

## 2024-09-06 MED ORDER — METHYLPREDNISOLONE SODIUM SUCC 125 MG IJ SOLR
125.0000 mg | Freq: Once | INTRAMUSCULAR | Status: AC
  Administered 2024-09-06: 125 mg via INTRAVENOUS
  Filled 2024-09-06: qty 2

## 2024-09-06 MED ORDER — ACETAMINOPHEN 325 MG PO TABS
650.0000 mg | ORAL_TABLET | Freq: Four times a day (QID) | ORAL | Status: DC | PRN
  Filled 2024-09-06 (×4): qty 2

## 2024-09-06 MED ORDER — MAGNESIUM SULFATE 2 GM/50ML IV SOLN
2.0000 g | Freq: Once | INTRAVENOUS | Status: AC
  Administered 2024-09-06: 2 g via INTRAVENOUS
  Filled 2024-09-06: qty 50

## 2024-09-06 MED ORDER — FUROSEMIDE 40 MG PO TABS
40.0000 mg | ORAL_TABLET | Freq: Every day | ORAL | Status: DC
  Administered 2024-09-07 – 2024-09-11 (×5): 40 mg via ORAL
  Filled 2024-09-06 (×7): qty 1

## 2024-09-06 MED ORDER — IPRATROPIUM-ALBUTEROL 0.5-2.5 (3) MG/3ML IN SOLN
RESPIRATORY_TRACT | Status: AC
  Administered 2024-09-06: 3 mL
  Filled 2024-09-06: qty 3

## 2024-09-06 MED ORDER — ACETAMINOPHEN 650 MG RE SUPP
650.0000 mg | Freq: Four times a day (QID) | RECTAL | Status: DC | PRN
  Filled 2024-09-06: qty 1

## 2024-09-06 MED ORDER — LEVOTHYROXINE SODIUM 25 MCG PO TABS
25.0000 ug | ORAL_TABLET | Freq: Every day | ORAL | Status: DC
  Administered 2024-09-07 – 2024-09-11 (×5): 25 ug via ORAL
  Filled 2024-09-06 (×6): qty 1

## 2024-09-06 MED ORDER — ATORVASTATIN CALCIUM 40 MG PO TABS
40.0000 mg | ORAL_TABLET | Freq: Every day | ORAL | Status: DC
  Administered 2024-09-06 – 2024-09-10 (×5): 40 mg via ORAL
  Filled 2024-09-06 (×6): qty 1

## 2024-09-06 MED ORDER — ALBUTEROL SULFATE (2.5 MG/3ML) 0.083% IN NEBU
INHALATION_SOLUTION | RESPIRATORY_TRACT | Status: AC
  Administered 2024-09-06: 2.5 mg
  Filled 2024-09-06: qty 3

## 2024-09-06 MED ORDER — IPRATROPIUM-ALBUTEROL 0.5-2.5 (3) MG/3ML IN SOLN: 3.0000 mL | RESPIRATORY_TRACT | Status: DC | PRN

## 2024-09-06 MED ORDER — FERROUS SULFATE 325 (65 FE) MG PO TABS
325.0000 mg | ORAL_TABLET | Freq: Every day | ORAL | Status: DC
Start: 2024-09-07 — End: 2024-09-07
  Administered 2024-09-07: 325 mg via ORAL
  Filled 2024-09-06: qty 1

## 2024-09-06 MED ORDER — PANTOPRAZOLE SODIUM 40 MG PO TBEC
40.0000 mg | DELAYED_RELEASE_TABLET | Freq: Two times a day (BID) | ORAL | Status: DC
  Administered 2024-09-06 – 2024-09-11 (×10): 40 mg via ORAL
  Filled 2024-09-06 (×13): qty 1

## 2024-09-06 MED ORDER — MELATONIN 3 MG PO TABS
12.0000 mg | ORAL_TABLET | Freq: Every day | ORAL | Status: DC
  Administered 2024-09-06 – 2024-09-10 (×5): 12 mg via ORAL
  Filled 2024-09-06 (×6): qty 4

## 2024-09-06 MED ORDER — DIGOXIN 125 MCG PO TABS
125.0000 ug | ORAL_TABLET | Freq: Every day | ORAL | Status: DC
  Administered 2024-09-07 – 2024-09-11 (×5): 125 ug via ORAL
  Filled 2024-09-06 (×7): qty 1

## 2024-09-06 MED ORDER — ASPIRIN 81 MG PO TBEC
81.0000 mg | DELAYED_RELEASE_TABLET | Freq: Every day | ORAL | Status: DC
  Administered 2024-09-07 – 2024-09-11 (×5): 81 mg via ORAL
  Filled 2024-09-06 (×8): qty 1

## 2024-09-06 MED ORDER — METHYLPREDNISOLONE SODIUM SUCC 40 MG IJ SOLR
40.0000 mg | Freq: Two times a day (BID) | INTRAMUSCULAR | Status: DC
Start: 2024-09-07 — End: 2024-09-07
  Administered 2024-09-07: 40 mg via INTRAVENOUS
  Filled 2024-09-06: qty 1

## 2024-09-06 NOTE — ED Notes (Signed)
 Patient placed on 2L prior to starting the CAT. SAT 86% on RA

## 2024-09-06 NOTE — ED Notes (Signed)
 Assuming pt care at this time.

## 2024-09-06 NOTE — Telephone Encounter (Signed)
 FYI Only or Action Required?: FYI only for provider. Recommended to ED due to continued symptoms.   Patient is followed in Pulmonology for COPD, last seen on 07/30/2024 by Darlean Ozell NOVAK, MD.  Called Nurse Triage reporting Shortness of Breath.  Symptoms began several days ago.  Interventions attempted: Rescue inhaler, Maintenance inhaler, Nebulizer treatments, and Home oxygen  use.  Symptoms are: gradually worsening.  Triage Disposition: Go to ED Now (or PCP Triage)  Patient/caregiver understands and will follow disposition?: Yes  Copied from CRM [id number]. Topic: Clinical - Red Word Triage >> Sep 06, 2024  3:53 PM Rilla B wrote: [facility] that prompted transfer to Nurse Triage: Oxygen  dropping down to low 80s Reason for Disposition  Patient sounds very sick or weak to the triager  Answer Assessment - Initial Assessment Questions 1. RESPIRATORY STATUS: Describe your breathing? (e.g., wheezing, shortness of breath, unable to speak, severe coughing)      Shortness of breath 2. ONSET: When did this breathing problem begin?      Has been going on since Sept 4th.  3. PATTERN Does the difficult breathing come and go, or has it been constant since it started?      Shortness of breath with ambulation 4. SEVERITY: How bad is your breathing? (e.g., mild, moderate, severe)      Mild 5. RECURRENT SYMPTOM: Have you had difficulty breathing before? If Yes, ask: When was the last time? and What happened that time?      yes 6. CARDIAC HISTORY: Do you have any history of heart disease? (e.g., heart attack, angina, bypass surgery, angioplasty)      CHF 7. LUNG HISTORY: Do you have any history of lung disease?  (e.g., pulmonary embolus, asthma, emphysema)     COPD 8. CAUSE: What do you think is causing the breathing problem?      unsure 9. OTHER SYMPTOMS: Do you have any other symptoms? (e.g., chest pain, cough, dizziness, fever, runny nose)     no 10. O2 SATURATION  MONITOR:  Do you use an oxygen  saturation monitor (pulse oximeter) at home? If Yes, ask: What is your reading (oxygen  level) today? What is your usual oxygen  saturation reading? (e.g., 95%)       Currently on oxygen -2L oxygen  saturation is currently in the upper 90s. Wife and patient report three episodes today of oxygen  dropping into low 80s.  12. TRAVEL: Have you traveled out of the country in the last month? (e.g., travel history, exposures)       No  Patient was seen in the ED on 08/26/2024-was given 40 mg PO of prednisone  for days. Wife states he is back to his regular 10 mg of prednisone  daily. Patient reports that he doesn't feel like the oxygen  is helping-even though his pulse oximeter readings improve with oxygen , patient states he doesn't feel hugely better.Wife states his oxygen  levels will drop multiple times a day with patient getting very fatigued with doing minimal things. Patient is recommended to the ED for evaluation.  Protocols used: Breathing Difficulty-A-AH

## 2024-09-06 NOTE — Progress Notes (Signed)
 Plan of Care Note for accepted transfer   Patient: SILVIANO NEUSER MRN: [id number]   DOA: 09/06/2024  Facility requesting transfer: [facility]   Requesting Provider: Dr. Ginger  Reason for transfer: COPD exacerbation   Facility course: 78 yr old man with COPD, OSA, nocturnal supplemental O2 requirement, HFpEF, and PAF s/p LAA occlusion who presents with increased SOB and cough.   He is saturating upper 80s in ED with mild tachypnea. There are no acute findings on CXR.   He was treated with IV Solu-Medrol , IV magnesium , DuoNeb, and supplemental O2.   Plan of care: The patient is accepted for admission to Telemetry unit, at [facility].   Author: Evalene GORMAN Sprinkles, MD 09/06/2024  Check [email or web address] for on-call coverage.  Nursing staff, Please call [facility] Admits & Consults System-Wide number on Amion as soon as patient's arrival, so appropriate admitting provider can evaluate the pt.

## 2024-09-06 NOTE — ED Provider Notes (Signed)
 [facility] Provider Note   CSN: [id number] Arrival date & time: 09/06/24  1655     Patient presents with: COPD   PHI [name] is a 78 y.o. male.   The history is provided by the patient and medical records. The history is limited by the condition of the patient. No language interpreter was used.  COPD This is a chronic problem. The current episode started more than 2 days ago. The problem occurs constantly. The problem has been gradually worsening. Associated symptoms include shortness of breath. Pertinent negatives include no chest pain, no abdominal pain and no headaches. The symptoms are aggravated by coughing. Nothing relieves the symptoms. He has tried nothing for the symptoms. The treatment provided no relief.       Prior to Admission medications   Medication Sig Start Date End Date Taking? Authorizing Provider  Accu-Chek Softclix Lancets lancets Check fasting sugar once daily. 08/05/23   CoxAbigail, MD  acetaminophen  (TYLENOL ) 500 MG tablet Take 1,000 mg by mouth at bedtime as needed (pain).    [provider]  albuterol  (PROAIR  HFA) 108 (90 Base) MCG/ACT inhaler Inhale 2 puffs into the lungs as needed for wheezing or shortness of breath.    [provider]  albuterol  (PROVENTIL ) (2.5 MG/3ML) 0.083% nebulizer solution Take 3 mLs (2.5 mg total) by nebulization every 6 (six) hours as needed for shortness of breath or wheezing. 10/23/22   Parrett, Madelin RAMAN, NP  ALPRAZolam  (XANAX ) 0.5 MG tablet Take 1 tablet (0.5 mg total) by mouth 2 (two) times daily as needed for anxiety. 07/25/22   Cox, [name], MD  amLODipine  (NORVASC ) 2.5 MG tablet Take 2.5 mg by mouth daily. 10/30/23   [provider]  Ascorbic Acid  (VITAMIN C ) 1000 MG tablet Take 1,000 mg by mouth daily.    [provider]  aspirin  EC 81 MG tablet Take 1 tablet (81 mg total) by mouth daily. Swallow whole. 01/28/22   McDaniel, Jill D, NP   atorvastatin  (LIPITOR) 40 MG tablet TAKE 1 TABLET BY MOUTH EVERYDAY AT BEDTIME 07/22/24   CoxAbigail, MD  blood glucose meter kit and supplies KIT Dispense based on patient and insurance preference. Use up to four times daily as directed. 05/06/23   CoxAbigail, MD  Blood Glucose Monitoring Suppl DEVI Use to test blood sugar up to twice daily. May substitute to any manufacturer covered by patient's insurance. E11.0 07/14/23   CoxAbigail, MD  budesonide -glycopyrrolate -formoterol  (BREZTRI  AEROSPHERE) 160-9-4.8 MCG/ACT AERO inhaler Inhale 2 puffs into the lungs 2 (two) times daily. 07/06/24   Cox, [name], MD  calcium  carbonate (OS-CAL) 600 MG TABS tablet Take 600 mg by mouth daily.    [provider]  Cholecalciferol  (VITAMIN D3) 50 MCG (2000 UT) TABS Take 2,000 Units by mouth in the morning.    [provider]  dapagliflozin  propanediol (FARXIGA ) 10 MG TABS tablet Take 1 tablet (10 mg total) by mouth daily. 07/06/24   CoxAbigail, MD  denosumab  (PROLIA ) 60 MG/ML SOSY injection Inject 60 mg into the skin every 6 (six) months. 03/11/23   Cox, [name], MD  digoxin  (LANOXIN ) 0.125 MG tablet TAKE 1 TABLET BY MOUTH DAILY 04/26/24   Jeffrie Oneil BROCKS, MD  ferrous sulfate  325 (65 FE) MG tablet Take 325 mg by mouth 2 (two) times daily with a meal. Patient taking differently: Take 325 mg by mouth daily with breakfast.    [provider]  fluticasone  (FLONASE ) 50 MCG/ACT  nasal spray Place 2 sprays into both nostrils daily as needed for allergies or rhinitis.    [provider]  furosemide  (LASIX ) 40 MG tablet Take 1 tablet by mouth daily, may take 1 extra tablet daily only as needed for weight gain or lower extremity swelling    [provider]  Glucose Blood (BLOOD GLUCOSE TEST STRIPS) STRP Use to test blood sugar up to twice daily. May substitute to any manufacturer covered by patient's insurance. E11.0 07/14/23   [name], MD  Lancet Device MISC Use to test blood  sugar up to twice daily. May substitute to any manufacturer covered by patient's insurance. E11.0 07/14/23   [name], MD  Lancets Misc. MISC Use to test blood sugar up to twice daily. May substitute to any manufacturer covered by patient's insurance. E11.0 07/14/23   CoxClapper, MD  levothyroxine  (SYNTHROID ) 25 MCG tablet Take 1 tablet (25 mcg total) by mouth daily. 05/21/24   CoxClapper, MD  loratadine  (CLARITIN ) 10 MG tablet Take 10 mg by mouth in the morning.    [provider]  Melatonin 12 MG TABS Take 12 mg by mouth at bedtime.    [provider]  Multiple Vitamin (MULTIVITAMIN WITH MINERALS) TABS tablet Take 1 tablet by mouth daily with breakfast.    [provider]  OXYGEN  Inhale 2 L/min into the lungs at bedtime.    [provider]  pantoprazole  (PROTONIX ) 40 MG tablet Take 1 tablet (40 mg total) by mouth 2 (two) times daily. 05/20/24   Parrett, Madelin RAMAN, NP  predniSONE  (DELTASONE ) 10 MG tablet TAKE 1 TABLET (10 MG TOTAL) BY MOUTH DAILY WITH BREAKFAST. 04/27/24   Parrett, Madelin RAMAN, NP  Probiotic Product (PROBIOTIC DAILY) CAPS Take 1 capsule by mouth in the morning.    [provider]  sertraline  (ZOLOFT ) 50 MG tablet TAKE 1 TABLET BY MOUTH EVERYDAY AT BEDTIME 04/21/24   Cox, Clapper, MD    Allergies: Tape, Daliresp  [roflumilast ], Levaquin  [levofloxacin ], and Alendronate    Review of Systems  Constitutional:  Negative for chills, fatigue and fever.  HENT:  Negative for congestion.   Respiratory:  Positive for cough, shortness of breath and wheezing. Negative for chest tightness and stridor.   Cardiovascular:  Negative for chest pain, palpitations and leg swelling.  Gastrointestinal:  Negative for abdominal pain, constipation, diarrhea, nausea and vomiting.  Genitourinary:  Negative for dysuria.  Musculoskeletal:  Negative for back pain and neck pain.  Skin:  Negative for rash and wound.  Neurological:  Negative for weakness,  light-headedness, numbness and headaches.  Psychiatric/Behavioral:  Negative for agitation and confusion.   All other systems reviewed and are negative.   Updated Vital Signs BP (!) 158/71   Pulse 83   Temp 97.8 F (36.6 C)   Resp (!) 28   SpO2 97%   Physical Exam Vitals and nursing note reviewed.  Constitutional:      General: He is in acute distress.     Appearance: He is well-developed.  HENT:     Head: Normocephalic and atraumatic.  Eyes:     Conjunctiva/sclera: Conjunctivae normal.  Cardiovascular:     Rate and Rhythm: Normal rate and regular rhythm.     Pulses: Normal pulses.     Heart sounds: No murmur heard. Pulmonary:     Effort: Respiratory distress present.     Breath sounds: No stridor. Wheezing and rhonchi present. No rales.  Chest:     Chest wall: No tenderness.  Abdominal:     Palpations: Abdomen is soft.     Tenderness: There is no abdominal tenderness.  Musculoskeletal:        General: No swelling or tenderness.     Cervical back: Neck supple. No tenderness.     Right lower leg: No edema.     Left lower leg: No edema.  Skin:    General: Skin is warm and dry.     Capillary Refill: Capillary refill takes less than 2 seconds.  Neurological:     Mental Status: He is alert.  Psychiatric:        Mood and Affect: Mood normal.     (all labs ordered are listed, but only abnormal results are displayed) Labs Reviewed  CBC WITH DIFFERENTIAL/PLATELET - Abnormal; Notable for the following components:      Result Value   WBC 11.2 (*)    Hemoglobin 12.5 (*)    MCV 76.8 (*)    MCH 22.6 (*)    MCHC 29.5 (*)    RDW 21.5 (*)    Neutro Abs 9.6 (*)    All other components within normal limits  COMPREHENSIVE METABOLIC PANEL WITH GFR - Abnormal; Notable for the following components:   Glucose, Bld 140 (*)    All other components within normal limits  LACTIC ACID, PLASMA - Abnormal; Notable for the following components:   Lactic Acid, Venous 2.5 (*)    All  other components within normal limits  RESP PANEL BY RT-PCR (RSV, FLU A&B, COVID)  [id number]  LACTIC ACID, PLASMA  TROPONIN T, HIGH SENSITIVITY    EKG: EKG Interpretation Date/Time:  Monday September 06 2024 17:03:36 EDT Ventricular Rate:  81 PR Interval:  141 QRS Duration:  85 QT Interval:  350 QTC Calculation: 407 R Axis:   69  Text Interpretation: Sinus rhythm Minimal ST depression, diffuse leads when compared to prior, more artifact No STEMI Confirmed by Ginger Barefoot ([id number]) on 09/06/2024 5:08:26 PM  Radiology: DG Chest Portable 1 View Result Date: 09/06/2024 CLINICAL DATA:  Hypoxia.  Shortness of breath. EXAM: PORTABLE CHEST 1 VIEW COMPARISON:  08/26/2024 FINDINGS: Heart size is normal. Evidence for a left atrial appendage occlusion device. Atherosclerotic calcifications at the aortic arch. Trachea is midline. Lungs are clear without focal airspace disease or pulmonary edema. Negative for a pneumothorax. No acute bone abnormality. IMPRESSION: No active disease. Electronically Signed   By: Juliene Balder M.D.   On: 09/06/2024 17:54     Procedures   CRITICAL CARE Performed by: Lonni PARAS Makinzie Considine Total critical care time: 35 minutes Critical care time was exclusive of separately billable procedures and treating other patients. Critical care was necessary to treat or prevent imminent or life-threatening deterioration. Critical care was time spent personally by me on the following activities: development of treatment plan with patient and/or surrogate as well as nursing, discussions with consultants, evaluation of patient's response to treatment, examination of patient, obtaining history from patient or surrogate, ordering and performing treatments and interventions, ordering and review of laboratory studies, ordering and review of radiographic studies, pulse oximetry and re-evaluation of patient's condition.   Medications Ordered in the ED - No data to display                                   Medical Decision Making Amount and/or Complexity of Data Reviewed Labs: ordered. Radiology: ordered.  Risk Prescription drug management. Decision regarding hospitalization.  JAS [name] is a 78 y.o. male with a past medical history significant for COPD, paroxysmal atrial fibrillation, diabetes, osteoporosis, hypertension, hypercholesterolemia, and GERD who presents with cough and shortness of breath.  According to patient and family, he has been battling with COPD and exacerbations for quite some time but over the last 3 days his breathing has worsened.  He reports his normal dry cough is now more wet and his oxygen  saturations have been going to 80% at home.  He does have oxygen  that he takes at night sometimes but this seems to be worse.  When patient arrived his oxygen  stations did drop to 84% which is lower than normal for him.  Patient does report this cough but denies getting anything up.  He reports no fevers, chills, nausea, vomiting, constipation, diarrhea, or urinary changes but no leg pain or leg swelling.  No chest pain.  No abdominal pain or back pain or flank pain.  No trauma.  No rashes.  Patient is having the cough and difficulty breathing.  Patient and wife are very concerned as they think he is worse than he normally is and suspect he may to be admitted.  He said that he recently had exacerbation and they have upped his chronic prednisone  from 10-40 but now that they went back down his breathing is worsened.  On exam, patient is breathing heavily and has significant wheezing and some rhonchi.  There is no rales.  Legs are nontender nonedematous and he has intact pulses.  Chest nontender abdomen nontender.  I did not appreciate a murmur.  He is using accessory muscles for his breathing and is tachypneic.  He is ill-appearing.  We agreed to get a COVID/flu/RSV swab, x-ray, and labs.  He will get a breathing treatment and get on oxygen  since his oxygen  saturations  were going to the 80s.  Will give him a shot of steroids and magnesium .  Due to his ill appearance and breathing I anticipate he will need admission after workup is completed.  6:30 PM X-ray does not show pneumonia and he was negative for COVID/flu/RSV but even after breathing treatments he is still wheezing still tachypneic and still do not feel he is safe for discharge home.  He is now on 2 L constantly which is different for him in the daytime.  Given his worsened breathing will admit for COPD exacerbation.  I still think he may have a viral URI contributing but it is not COVID or flu.  Otherwise workup reassuring.  Patient be admitted for COPD exacerbation.      Final diagnoses:  COPD exacerbation (HCC)  Hypoxia  Acute cough  Wheezing  Shortness of breath     Clinical Impression: 1. COPD exacerbation (HCC)   2. Hypoxia   3. Acute cough   4. Wheezing   5. Shortness of breath     Disposition: Admit  This note was prepared with assistance of Dragon voice recognition software. Occasional wrong-word or sound-a-like substitutions may have occurred due to the inherent limitations of voice recognition software.      Maridel Pixler, Lonni PARAS, MD 09/06/24 [facility]

## 2024-09-06 NOTE — ED Triage Notes (Signed)
 Pt is here for CPOD exacerbation.  Pt has beein having increasing sob for the past 3 days and worse today.  Pt has home O2, which he wears at night and prn. Pt is tachypneic and spo2 93% on RA in triage.

## 2024-09-06 NOTE — H&P (Incomplete)
 History and Physical    Paul [name] FMW:[id number] DOB: 09-07-1946 DOA: 09/06/2024  Patient coming from: Home.  Chief Complaint: Shortness of breath.  HPI: Paul [name] is a 78 y.o. male with history of COPD on 2 L home oxygen , sleep apnea, diastolic CHF, hypertension, A-fib status post Watchman device placement, diabetes mellitus type 2, anemia, hypothyroidism, GERD presents to the ER with complaints of shortness of breath.  Patient states over the last 3 days patient has been getting more short of breath than usual.  Denies any chest pain productive cough fever or chills.  Shortness of breath is present even at rest.  Patient states his shortness of breath is typical of his COPD exacerbation.  ED Course: In the ER chest x-ray was unremarkable patient is found to be diffusely wheezing.  COVID and flu test were negative.  Labs show troponin negative lactic acid 2.5 and 2.1.  Hemoglobin 12.5 WBC 11.2.  Patient admitted for COPD exacerbation.  Started on IV methylprednisolone  nebulizer therapy.  Review of Systems: As per HPI, rest all negative.   Past Medical History:  Diagnosis Date   Anemia    Aortic atherosclerosis (HCC)    Aspiration pneumonia due to inhalation of vomitus (HCC) 02/03/2022   Atrial fibrillation (HCC)    Back pain    COPD (chronic obstructive pulmonary disease) (HCC)    COPD exacerbation (HCC) 12/27/2021   COVID-19 12/24/2020   Diabetes mellitus without complication (HCC)    Dizziness 04/08/2022   Elevated PSA    GAD (generalized anxiety disorder)    GERD (gastroesophageal reflux disease)    High cholesterol    Hypertension    Lingular pneumonia 01/05/2021   See cxr 01/06/20 rx with zpak/ cefipime x 5 days and then developed purulent sputum again 01/12/21 so rx levcaquin 500 mg daily x 7 days then return for cxr before more    Lung nodule < 6cm on CT 05/29/2016   Osteoporosis    Oxygen  deficiency    Pneumonia    January 2022   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 Cindie with a 20 mm Watchman FLX device   RSV bronchitis 01/27/2022   Vertebral compression fracture (HCC) 05/29/2019   T8 compression fracture noted on CT scan 05/28/2019 inpatient with chronic steroid dependent COPD - rx calcitonin nasal spray rx per PCP     Past Surgical History:  Procedure Laterality Date   CATARACT EXTRACTION Right    ELBOW SURGERY  07/2022   surgery on olecranon bursa. Dr. Alyse   IR KYPHO EA ADDL LEVEL THORACIC OR LUMBAR  11/28/2021   LEFT ATRIAL APPENDAGE OCCLUSION N/A 08/02/2021   Procedure: LEFT ATRIAL APPENDAGE OCCLUSION;  Surgeon: Wonda Sharper, MD;  Location: [facility] INVASIVE CV LAB;  Service: Cardiovascular;  Laterality: N/A;   SKIN SURGERY     shoulders and chest   TEE WITHOUT CARDIOVERSION N/A 08/02/2021   Procedure: TRANSESOPHAGEAL ECHOCARDIOGRAM (TEE);  Surgeon: Wonda Sharper, MD;  Location: [facility] INVASIVE CV LAB;  Service: Cardiovascular;  Laterality: N/A;   TEE WITHOUT CARDIOVERSION N/A 09/13/2021   Procedure: TRANSESOPHAGEAL ECHOCARDIOGRAM (TEE);  Surgeon: Francyne Headland, MD;  Location: [facility] ENDOSCOPY;  Service: Cardiovascular;  Laterality: N/A;     reports that he quit smoking about 15 years ago. His smoking use included cigarettes. He started smoking about 67 years ago. He has a 104 pack-year smoking history. He has never used smokeless tobacco. He reports that he does not currently  use alcohol. He reports that he does not use drugs.  Allergies  Allergen Reactions   Tape Other (See Comments)    PLEASE USE COBAN WRAP IN LIEU OF TAPE, as it pulls off the skin!!   Daliresp  [Roflumilast ] Diarrhea and Nausea And Vomiting   Levaquin  [Levofloxacin ] Palpitations and Other (See Comments)    Made the B/P fluctuate and heart raced   Alendronate Anxiety and Other (See Comments)    Jittery/nervous    Family History  Problem Relation Age of Onset   Heart disease Brother    Heart disease Mother     Heart disease Father    Cancer Paternal Uncle     Prior to Admission medications   Medication Sig Start Date End Date Taking? Authorizing Provider  Accu-Chek Softclix Lancets lancets Check fasting sugar once daily. 08/05/23   CoxAbigail, MD  acetaminophen  (TYLENOL ) 500 MG tablet Take 1,000 mg by mouth at bedtime as needed (pain).    [provider]  albuterol  (PROAIR  HFA) 108 (90 Base) MCG/ACT inhaler Inhale 2 puffs into the lungs as needed for wheezing or shortness of breath.    [provider]  albuterol  (PROVENTIL ) (2.5 MG/3ML) 0.083% nebulizer solution Take 3 mLs (2.5 mg total) by nebulization every 6 (six) hours as needed for shortness of breath or wheezing. 10/23/22   Parrett, Madelin RAMAN, NP  ALPRAZolam  (XANAX ) 0.5 MG tablet Take 1 tablet (0.5 mg total) by mouth 2 (two) times daily as needed for anxiety. 07/25/22   CoxAbigail, MD  amLODipine  (NORVASC ) 2.5 MG tablet Take 2.5 mg by mouth daily. 10/30/23   [provider]  Ascorbic Acid  (VITAMIN C ) 1000 MG tablet Take 1,000 mg by mouth daily.    [provider]  aspirin  EC 81 MG tablet Take 1 tablet (81 mg total) by mouth daily. Swallow whole. 01/28/22   McDaniel, Jill D, NP  atorvastatin  (LIPITOR) 40 MG tablet TAKE 1 TABLET BY MOUTH EVERYDAY AT BEDTIME 07/22/24   CoxAbigail, MD  blood glucose meter kit and supplies KIT Dispense based on patient and insurance preference. Use up to four times daily as directed. 05/06/23   CoxAbigail, MD  Blood Glucose Monitoring Suppl DEVI Use to test blood sugar up to twice daily. May substitute to any manufacturer covered by patient's insurance. E11.0 07/14/23   CoxAbigail, MD  budesonide -glycopyrrolate -formoterol  (BREZTRI  AEROSPHERE) 160-9-4.8 MCG/ACT AERO inhaler Inhale 2 puffs into the lungs 2 (two) times daily. 07/06/24   CoxAbigail, MD  calcium  carbonate (OS-CAL) 600 MG TABS tablet Take 600 mg by mouth daily.    [provider]  Cholecalciferol  (VITAMIN D3) 50  MCG (2000 UT) TABS Take 2,000 Units by mouth in the morning.    [provider]  dapagliflozin  propanediol (FARXIGA ) 10 MG TABS tablet Take 1 tablet (10 mg total) by mouth daily. 07/06/24   CoxAbigail, MD  denosumab  (PROLIA ) 60 MG/ML SOSY injection Inject 60 mg into the skin every 6 (six) months. 03/11/23   Cox, [name], MD  digoxin  (LANOXIN ) 0.125 MG tablet TAKE 1 TABLET BY MOUTH DAILY 04/26/24   Jeffrie Oneil BROCKS, MD  ferrous sulfate  325 (65 FE) MG tablet Take 325 mg by mouth 2 (two) times daily with a meal. Patient taking differently: Take 325 mg by mouth daily with breakfast.    [provider]  fluticasone  (FLONASE ) 50 MCG/ACT nasal spray Place 2 sprays into both nostrils daily as needed for allergies or rhinitis.    [provider]  furosemide  (LASIX ) 40 MG tablet Take 1 tablet by mouth daily, may take 1 extra tablet daily only as needed for weight gain or lower extremity swelling    [provider]  Glucose Blood (BLOOD GLUCOSE TEST STRIPS) STRP Use to test blood sugar up to twice daily. May substitute to any manufacturer covered by patient's insurance. E11.0 07/14/23   [name], MD  Lancet Device MISC Use to test blood sugar up to twice daily. May substitute to any manufacturer covered by patient's insurance. E11.0 07/14/23   [name], MD  Lancets Misc. MISC Use to test blood sugar up to twice daily. May substitute to any manufacturer covered by patient's insurance. E11.0 07/14/23   CoxClapper, MD  levothyroxine  (SYNTHROID ) 25 MCG tablet Take 1 tablet (25 mcg total) by mouth daily. 05/21/24   CoxClapper, MD  loratadine  (CLARITIN ) 10 MG tablet Take 10 mg by mouth in the morning.    [provider]  Melatonin 12 MG TABS Take 12 mg by mouth at bedtime.    [provider]  Multiple Vitamin (MULTIVITAMIN WITH MINERALS) TABS tablet Take 1 tablet by mouth daily with breakfast.    [provider]  OXYGEN  Inhale 2 L/min into the lungs at  bedtime.    [provider]  pantoprazole  (PROTONIX ) 40 MG tablet Take 1 tablet (40 mg total) by mouth 2 (two) times daily. 05/20/24   Parrett, Madelin RAMAN, NP  predniSONE  (DELTASONE ) 10 MG tablet TAKE 1 TABLET (10 MG TOTAL) BY MOUTH DAILY WITH BREAKFAST. 04/27/24   Parrett, Madelin RAMAN, NP  Probiotic Product (PROBIOTIC DAILY) CAPS Take 1 capsule by mouth in the morning.    [provider]  sertraline  (ZOLOFT ) 50 MG tablet TAKE 1 TABLET BY MOUTH EVERYDAY AT BEDTIME 04/21/24   CoxClapper, MD    Physical Exam: Constitutional: Moderately built and nourished. Vitals:   09/06/24 1900 09/06/24 2000 09/06/24 2013 09/06/24 2104  BP: (!) 171/81 (!) 168/62  (!) 144/43  Pulse: 94 83  86  Resp: (!) 23 20  19   Temp: 97.8 F (36.6 C)   98.2 F (36.8 C)  TempSrc: Oral   Oral  SpO2: 100% 100%  99%  Weight:   70.9 kg   Height:   5' 5 (1.651 m)    Eyes: Anicteric no pallor. ENMT: No discharge from the ears eyes nose or mouth. Neck: No JVD appreciated no mass felt. Respiratory: Tight exchange of air. Cardiovascular: S1-S2 heard. Abdomen: Soft nontender bowel sound present. Musculoskeletal: No edema. Skin: No rash. Neurologic: Alert awake oriented to time place and person.  Moves all extremities. Psychiatric: Appears normal.  Normal affect.   Labs on Admission: I have personally reviewed following labs and imaging studies  CBC: Recent Labs  Lab 09/06/24 1723  WBC 11.2*  NEUTROABS 9.6*  HGB 12.5*  HCT 42.4  MCV 76.8*  PLT 304   Basic Metabolic Panel: Recent Labs  Lab 09/06/24 1723  NA 141  K 4.7  CL 100  CO2 29  GLUCOSE 140*  BUN 15  CREATININE 0.97  CALCIUM  9.6   GFR: Estimated Creatinine Clearance: 53.7 mL/min (by C-G formula based on SCr of 0.97 mg/dL). Liver Function Tests: Recent Labs  Lab 09/06/24 1723  AST 29  ALT 41  ALKPHOS 97  BILITOT 0.3  PROT 7.0  ALBUMIN  4.4   No results for input(s): LIPASE, AMYLASE in the last 168 hours. No results for  input(s): AMMONIA in the last 168 hours. Coagulation  Profile: No results for input(s): INR, PROTIME in the last 168 hours. Cardiac Enzymes: No results for input(s): CKTOTAL, CKMB, CKMBINDEX, TROPONINI in the last 168 hours. BNP (last 3 results) Recent Labs    08/26/24 1529  PROBNP 250.0   HbA1C: No results for input(s): HGBA1C in the last 72 hours. CBG: No results for input(s): GLUCAP in the last 168 hours. Lipid Profile: No results for input(s): CHOL, HDL, LDLCALC, TRIG, CHOLHDL, LDLDIRECT in the last 72 hours. Thyroid  Function Tests: No results for input(s): TSH, T4TOTAL, FREET4, T3FREE, THYROIDAB in the last 72 hours. Anemia Panel: No results for input(s): VITAMINB12, FOLATE, FERRITIN, TIBC, IRON, RETICCTPCT in the last 72 hours. Urine analysis:    Component Value Date/Time   COLORURINE YELLOW 08/20/2023 1401   APPEARANCEUR CLEAR 08/20/2023 1401   LABSPEC <=1.005 08/20/2023 1401   PHURINE 5.5 08/20/2023 1401   GLUCOSEU >=500 (A) 08/20/2023 1401   HGBUR NEGATIVE 08/20/2023 1401   BILIRUBINUR NEGATIVE 08/20/2023 1401   BILIRUBINUR Negative 05/10/2021 0931   KETONESUR NEGATIVE 08/20/2023 1401   PROTEINUR NEGATIVE 08/20/2023 1401   UROBILINOGEN negative (A) 05/10/2021 0931   NITRITE NEGATIVE 08/20/2023 1401   LEUKOCYTESUR NEGATIVE 08/20/2023 1401   Sepsis Labs: @LABRCNTIP (procalcitonin:4,lacticidven:4) ) Recent Results (from the past 240 hours)  Resp panel by RT-PCR (RSV, Flu A&B, Covid) Anterior Nasal Swab     Status: None   Collection Time: 09/06/24  5:32 PM   Specimen: Anterior Nasal Swab  Result Value Ref Range Status   SARS Coronavirus 2 by RT PCR NEGATIVE NEGATIVE Final    Comment: (NOTE) SARS-CoV-2 target nucleic acids are NOT DETECTED.  The SARS-CoV-2 RNA is generally detectable in upper respiratory specimens during the acute phase of infection. The lowest concentration of SARS-CoV-2 viral copies this assay  can detect is 138 copies/mL. A negative result does not preclude SARS-Cov-2 infection and should not be used as the sole basis for treatment or other patient management decisions. A negative result may occur with  improper specimen collection/handling, submission of specimen other than nasopharyngeal swab, presence of viral mutation(s) within the areas targeted by this assay, and inadequate number of viral copies(<138 copies/mL). A negative result must be combined with clinical observations, patient history, and epidemiological information. The expected result is Negative.  Fact Sheet for Patients:  BloggerCourse.com  Fact Sheet for Healthcare Providers:  SeriousBroker.it  This test is no t yet approved or cleared by the [place]  FDA and  has been authorized for detection and/or diagnosis of SARS-CoV-2 by FDA under an Emergency Use Authorization (EUA). This EUA will remain  in effect (meaning this test can be used) for the duration of the COVID-19 declaration under Section 564(b)(1) of the Act, 21 U.S.C.section 360bbb-3(b)(1), unless the authorization is terminated  or revoked sooner.       Influenza A by PCR NEGATIVE NEGATIVE Final   Influenza B by PCR NEGATIVE NEGATIVE Final    Comment: (NOTE) The Xpert Xpress SARS-CoV-2/FLU/RSV plus assay is intended as an aid in the diagnosis of influenza from Nasopharyngeal swab specimens and should not be used as a sole basis for treatment. Nasal washings and aspirates are unacceptable for Xpert Xpress SARS-CoV-2/FLU/RSV testing.  Fact Sheet for Patients: BloggerCourse.com  Fact Sheet for Healthcare Providers: SeriousBroker.it  This test is not yet approved or cleared by the [place]  FDA and has been authorized for detection and/or diagnosis of SARS-CoV-2 by FDA under an Emergency Use Authorization (EUA). This EUA will  remain in effect (meaning this test can  be used) for the duration of the COVID-19 declaration under Section 564(b)(1) of the Act, 21 U.S.C. section 360bbb-3(b)(1), unless the authorization is terminated or revoked.     Resp Syncytial Virus by PCR NEGATIVE NEGATIVE Final    Comment: (NOTE) Fact Sheet for Patients: BloggerCourse.com  Fact Sheet for Healthcare Providers: SeriousBroker.it  This test is not yet approved or cleared by the [place]  FDA and has been authorized for detection and/or diagnosis of SARS-CoV-2 by FDA under an Emergency Use Authorization (EUA). This EUA will remain in effect (meaning this test can be used) for the duration of the COVID-19 declaration under Section 564(b)(1) of the Act, 21 U.S.C. section 360bbb-3(b)(1), unless the authorization is terminated or revoked.  Performed at [facility], [address] [place], [place] [address]      Radiological Exams on Admission: DG Chest Portable 1 View Result Date: 09/06/2024 CLINICAL DATA:  Hypoxia.  Shortness of breath. EXAM: PORTABLE CHEST 1 VIEW COMPARISON:  08/26/2024 FINDINGS: Heart size is normal. Evidence for a left atrial appendage occlusion device. Atherosclerotic calcifications at the aortic arch. Trachea is midline. Lungs are clear without focal airspace disease or pulmonary edema. Negative for a pneumothorax. No acute bone abnormality. IMPRESSION: No active disease. Electronically Signed   By: Juliene Balder M.D.   On: 09/06/2024 17:54    EKG: Independently reviewed.  Normal sinus rhythm nonspecific ST-T changes.  Assessment/Plan Active Problems:   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HCC)   Paroxysmal atrial fibrillation (HCC)   Chronic diastolic CHF (congestive heart failure) (HCC)   OSA (obstructive sleep apnea)   COPD with acute exacerbation (HCC)   COPD exacerbation (HCC)    COPD exacerbation -    on exam patient is  still short of breath but not in distress.  Has wheezing bilaterally.  Will keep patient on scheduled and as needed nebulizer with antibiotics and IV Solu-Medrol .  Patient is at home on 2 L oxygen . Chronic HFpEF last EF measured was 65 to 70% with grade 1 diastolic dysfunction on February 2024.  Continue Lasix  p.o.  proBNP is pending. History of paroxysmal atrial fibrillation status post Watchman device placement.  On digoxin .  Digoxin  levels pending. Hypertension uncontrolled on amlodipine .  Follow blood pressure trends.  May need to increase amlodipine  dose if still persistently elevated. Diabetes mellitus type 2 takes Farxiga  last hemoglobin A1c was 6.3   2 months ago. Hypothyroidism on Synthroid . Anemia follow CBC. Sleep apnea on CPAP at bedtime. GERD on PPI.  Since patient has COPD exacerbation will need further management and more than 2 midnight stay.   DVT prophylaxis: Lovenox . Code Status: Full code. Family Communication: Discussed with patient. Disposition Plan: Monitored bed. Consults called: None. Admission status: Observation.

## 2024-09-06 NOTE — ED Notes (Signed)
 ED Provider at bedside.

## 2024-09-06 NOTE — ED Notes (Signed)
 [facility] has been called and is aware that [facility] is In route with pt. And handoff needs to be signed.

## 2024-09-06 NOTE — ED Notes (Signed)
 Patient walked to room, SOB noted and SAT 89% after ambulation. Albuterol  & Atrovent  neb given. RN at bedside

## 2024-09-07 ENCOUNTER — Inpatient Hospital Stay (HOSPITAL_COMMUNITY)

## 2024-09-07 DIAGNOSIS — E1169 Type 2 diabetes mellitus with other specified complication: Secondary | ICD-10-CM

## 2024-09-07 DIAGNOSIS — J9611 Chronic respiratory failure with hypoxia: Secondary | ICD-10-CM

## 2024-09-07 DIAGNOSIS — I5032 Chronic diastolic (congestive) heart failure: Secondary | ICD-10-CM

## 2024-09-07 DIAGNOSIS — J441 Chronic obstructive pulmonary disease with (acute) exacerbation: Secondary | ICD-10-CM | POA: Diagnosis not present

## 2024-09-07 DIAGNOSIS — R0602 Shortness of breath: Secondary | ICD-10-CM | POA: Diagnosis not present

## 2024-09-07 DIAGNOSIS — E1159 Type 2 diabetes mellitus with other circulatory complications: Secondary | ICD-10-CM | POA: Diagnosis not present

## 2024-09-07 DIAGNOSIS — I480 Paroxysmal atrial fibrillation: Secondary | ICD-10-CM | POA: Diagnosis not present

## 2024-09-07 DIAGNOSIS — K219 Gastro-esophageal reflux disease without esophagitis: Secondary | ICD-10-CM

## 2024-09-07 DIAGNOSIS — I700 Atherosclerosis of aorta: Secondary | ICD-10-CM | POA: Diagnosis not present

## 2024-09-07 DIAGNOSIS — I152 Hypertension secondary to endocrine disorders: Secondary | ICD-10-CM

## 2024-09-07 DIAGNOSIS — E782 Mixed hyperlipidemia: Secondary | ICD-10-CM

## 2024-09-07 LAB — COMPREHENSIVE METABOLIC PANEL WITH GFR
ALT: 36 U/L (ref 0–44)
AST: 25 U/L (ref 15–41)
Albumin: 4.1 g/dL (ref 3.5–5.0)
Alkaline Phosphatase: 86 U/L (ref 38–126)
Anion gap: 14 (ref 5–15)
BUN: 18 mg/dL (ref 8–23)
CO2: 26 mmol/L (ref 22–32)
Calcium: 9.7 mg/dL (ref 8.9–10.3)
Chloride: 103 mmol/L (ref 98–111)
Creatinine, Ser: 0.88 mg/dL (ref 0.61–1.24)
GFR, Estimated: >60 mL/min (ref >60)
Glucose, Bld: 158 mg/dL — ABNORMAL HIGH (ref 70–99)
Potassium: 4.9 mmol/L (ref 3.5–5.1)
Sodium: 143 mmol/L (ref 135–145)
Total Bilirubin: 0.2 mg/dL (ref 0.0–1.2)
Total Protein: 6.6 g/dL (ref 6.5–8.1)

## 2024-09-07 LAB — CBC
HCT: 42.6 % (ref 39.0–52.0)
Hemoglobin: 11.8 g/dL — ABNORMAL LOW (ref 13.0–17.0)
MCH: 22 pg — ABNORMAL LOW (ref 26.0–34.0)
MCHC: 27.7 g/dL — ABNORMAL LOW (ref 30.0–36.0)
MCV: 79.3 fL — ABNORMAL LOW (ref 80.0–100.0)
Platelets: 293 K/uL (ref 150–400)
RBC: 5.37 MIL/uL (ref 4.22–5.81)
RDW: 21.7 % — ABNORMAL HIGH (ref 11.5–15.5)
WBC: 8.7 K/uL (ref 4.0–10.5)
nRBC: 0 % (ref 0.0–0.2)

## 2024-09-07 LAB — GLUCOSE, CAPILLARY
Glucose-Capillary: 126 mg/dL — ABNORMAL HIGH (ref 70–99)
Glucose-Capillary: 107 mg/dL — ABNORMAL HIGH (ref 70–99)
Glucose-Capillary: 200 mg/dL — ABNORMAL HIGH (ref 70–99)

## 2024-09-07 LAB — DIGOXIN LEVEL: Digoxin Level: 1 ng/mL (ref 0.8–2.0)

## 2024-09-07 LAB — PRO BRAIN NATRIURETIC PEPTIDE: Pro Brain Natriuretic Peptide: 259 pg/mL (ref <300.0)

## 2024-09-07 MED ORDER — METHYLPREDNISOLONE SODIUM SUCC 125 MG IJ SOLR
60.0000 mg | Freq: Two times a day (BID) | INTRAMUSCULAR | Status: DC
  Administered 2024-09-07 – 2024-09-08 (×2): 60 mg via INTRAVENOUS
  Filled 2024-09-07 (×2): qty 2

## 2024-09-07 MED ORDER — DOXYCYCLINE HYCLATE 100 MG PO TABS: 100.0000 mg | ORAL_TABLET | Freq: Two times a day (BID) | ORAL | Status: DC

## 2024-09-07 MED ORDER — DOXYCYCLINE HYCLATE 100 MG PO TABS
100.0000 mg | ORAL_TABLET | Freq: Two times a day (BID) | ORAL | Status: DC
  Administered 2024-09-07 – 2024-09-11 (×8): 100 mg via ORAL
  Filled 2024-09-07 (×11): qty 1

## 2024-09-07 MED ORDER — IPRATROPIUM-ALBUTEROL 0.5-2.5 (3) MG/3ML IN SOLN
3.0000 mL | Freq: Two times a day (BID) | RESPIRATORY_TRACT | Status: DC
  Administered 2024-09-07 – 2024-09-11 (×8): 3 mL via RESPIRATORY_TRACT
  Filled 2024-09-07 (×11): qty 3

## 2024-09-07 MED ORDER — GUAIFENESIN ER 600 MG PO TB12
1200.0000 mg | ORAL_TABLET | Freq: Two times a day (BID) | ORAL | Status: DC
  Administered 2024-09-07 – 2024-09-11 (×9): 1200 mg via ORAL
  Filled 2024-09-07 (×12): qty 2

## 2024-09-07 MED ORDER — BUDESON-GLYCOPYRROL-FORMOTEROL 160-9-4.8 MCG/ACT IN AERO
2.0000 | INHALATION_SPRAY | Freq: Two times a day (BID) | RESPIRATORY_TRACT | Status: DC
  Administered 2024-09-07 – 2024-09-11 (×9): 2 via RESPIRATORY_TRACT
  Filled 2024-09-07 (×2): qty 5.9

## 2024-09-07 MED ORDER — FERROUS SULFATE 325 (65 FE) MG PO TABS
325.0000 mg | ORAL_TABLET | Freq: Every day | ORAL | Status: DC
  Administered 2024-09-08 – 2024-09-10 (×3): 325 mg via ORAL
  Filled 2024-09-07 (×4): qty 1

## 2024-09-07 NOTE — Progress Notes (Signed)
   09/07/24 0030  BiPAP/CPAP/SIPAP  BiPAP/CPAP/SIPAP Pt Type Adult  Reason BIPAP/CPAP not in use Other(comment) (pt refused the use for only tonight would like RT to try again tomorrow)  BiPAP/CPAP /SiPAP Vitals  Resp (!) 22  Bilateral Breath Sounds Diminished  MEWS Score/Color  MEWS Score 1  MEWS Score Color Green

## 2024-09-07 NOTE — Telephone Encounter (Signed)
 Patient was seen in the ED on yesterday (9/15) and was admitted.  Nothing further needed.

## 2024-09-07 NOTE — Progress Notes (Signed)
 PT brought Flutter device from home.

## 2024-09-07 NOTE — Progress Notes (Signed)
   09/07/24 1538  TOC Brief Assessment  Insurance and Status Reviewed  Patient has primary care physician Yes  Home environment has been reviewed home w/ spouse  Prior level of function: independent  Prior/Current Home Services No current home services  Social Drivers of Health Review SDOH reviewed no interventions necessary  Readmission risk has been reviewed Yes  Transition of care needs no transition of care needs at this time

## 2024-09-07 NOTE — Hospital Course (Addendum)
 Paul [name] is a 78 y.o. male with history of COPD on 2 L home oxygen , sleep apnea, diastolic CHF, hypertension, A-fib status post Watchman device placement, diabetes mellitus type 2, anemia, hypothyroidism, GERD presents to the ER with complaints of shortness of breath.  Patient states over the last 3 days patient has been getting more short of breath than usual.  Patient admitted for COPD exacerbation.  Started on IV methylprednisolone , nebulizer therapy, and Abx.  Assessment and Plan:  Acute COPD Exacerbation with Chronic Respiratory Failure w/ Hypoxia on 2 Liters: Has wheezing bilaterally in the posterior Lung Fields.  C/w Duobneb 3 mL BID scheduled and q2hprn nebulizer as  well as Breztri . C/w Antibiotics w/ Doxycycline  and IV Solu-Medrol  but increase to 60 mg BID. Patient is at home on 2 L oxygen . CTM Respiratory Status carefully. Repeat CXR Negative for acute cardiopulmonary disease. Obtain PT/OT to Evaluate. Will need Ambulatory Home O2 Screen. Add Flutter Valve, Incentive Spirometry, and Guaifenesin  1200 mg po BID. WBC went from 11.6 -> 8.7.   Chronic HFpEF: Compensated and appears Euvolemic. Last EF measured was 65 to 70% with G1DD on February 2024. Continue p.o Furosemide  40 mg po Daily and Dapaglilozin 10 mg po Daily. proBNP was 259.0.   History of Paroxysmal Atrial Fibrillation: S/p Watchman device placement. C/w Digoxin  125 mcg po Daily. Digoxin  Level was 1.0  Essential Hypertension, uncontrolled: C/w Amlodipine  2.5 mg po Daily. CTM BP per Protocol and May need to increase Amlodipine  dose if still persistently elevated. Last BP reading was 147/62.  Diabetes Mellitus Type 2: C/w Dapagliflozin  Propanediol 10 mg po Daily. Last hemoglobin A1c was 6.3 ~ 2 months ago.  Also placed on a sensitive NovoLog /scale insulin  AC.  Expect blood sugars to be elevated in the setting of steroid to margination. CTM CBGs per Protocol. CBG was 126 on last check.  Lactic Acidosis: Mild and improving. LA went  from 2.5 -> 2.1. Repeat in the AM  Hypothyroidism: Check TSH in the AM. C/w Levothyroxine  25 mcg po Daily   Obstructive Sleep Apnea: C/w CPAP at bedtime.  HLD: C/w Atorvastatin  40 mg po qHS  Microcytic Anemia: Hgb/Hct Trend:  Recent Labs  Lab 08/26/24 1529 09/06/24 1723 09/06/24 2237 09/07/24 0502  HGB 12.0* 12.5* 11.6* 11.8*  HCT 41.1 42.4 40.9 42.6  MCV 76.0* 76.8* 79.1* 79.3*  -C/w Ferrous Sulfate  325 mg po Daily. Check Anemia Panel in the AM. CTM for S/Sx of Bleeding; No overt bleeding noted. Repeat CBC in the AM  GERD/GI Prophylaxis: Continue PPI w/ Pantoprazole  40 mg po BID  Overweight: Complicates overall prognosis and care. Estimated body mass index is 26.01 kg/m as calculated from the following:   Height as of this encounter: 5' 5 (1.651 m).   Weight as of this encounter: 70.9 kg. Weight Loss and Dietary Counseling given

## 2024-09-07 NOTE — Evaluation (Signed)
 Physical Therapy Evaluation Patient Details Name: Paul [name] MRN: [id number] DOB: 03/22/1946 Today's Date: 09/07/2024  History of Present Illness  Pt is 78 yo male admitted on 09/06/24 with COPD exacerbation.  Pt with hx including but not limited to COPD , OSA, CHF, HTN, afib s/p Watchman device, DM, anemia, hypothyroidism, GERD.  Clinical Impression  Pt admitted with above diagnosis.  He is independent at baseline.  He has O2 at home that he wears at night or with exercising.  Today, pt supervision (for lines) to independent with transfers and ambulation.  He ambulated 400' safely and at normal speed.  He did require 2 L O2 for activity. Pt safe to mobilize in room managing longer O2 tube but will need supervision in hallway to manage tank.  No further skilled PT need. Noted pt possible hospital at home pt - from PT perspective is safe with mobility.  Pt on 2 L with sats 99%. Tried RA but dropped to 92% rest. Back on 2 L and up to 98% prior to ambulation. Ambulating on 2 L with sats to 93% and mild DOE.         If plan is discharge home, recommend the following: Assistance with cooking/housework   Can travel by private vehicle        Equipment Recommendations None recommended by PT  Recommendations for Other Services       Functional Status Assessment Patient has not had a recent decline in their functional status     Precautions / Restrictions Precautions Precautions: None      Mobility  Bed Mobility Overal bed mobility: Independent                  Transfers Overall transfer level: Independent                 General transfer comment: Demonstrated sit to stand and pivot safely during therapy    Ambulation/Gait Ambulation/Gait assistance: Supervision Gait Distance (Feet): 400 Feet Assistive device: None Gait Pattern/deviations: WFL(Within Functional Limits) Gait velocity: normal     General Gait Details: Supervision for IV pole and O2 tank only;  normal gait; steady balance  Stairs            Wheelchair Mobility     Tilt Bed    Modified Rankin (Stroke Patients Only)       Balance Overall balance assessment: Independent Sitting-balance support: No upper extremity supported Sitting balance-Leahy Scale: Normal     Standing balance support: No upper extremity supported Standing balance-Leahy Scale: Good                               Pertinent Vitals/Pain Pain Assessment Pain Assessment: No/denies pain    Home Living Family/patient expects to be discharged to:: Private residence Living Arrangements: Spouse/significant other Available Help at Discharge: Family;Available 24 hours/day Type of Home: House Home Access: Level entry       Home Layout: One level Home Equipment: Rollator (4 wheels);Shower seat;Grab bars - tub/shower;Cane - single point Additional Comments: On home O2 when sleeping or with exercise    Prior Function Prior Level of Function : Independent/Modified Independent;Driving             Mobility Comments: Active, Walks without AD, can ambulate in community, denies falls ADLs Comments: Independent adls and iadls     Extremity/Trunk Assessment   Upper Extremity Assessment Upper Extremity Assessment: Overall WFL for tasks assessed  Lower Extremity Assessment Lower Extremity Assessment: Overall WFL for tasks assessed    Cervical / Trunk Assessment Cervical / Trunk Assessment: Normal  Communication        Cognition Arousal: Alert Behavior During Therapy: WFL for tasks assessed/performed   PT - Cognitive impairments: No apparent impairments                                 Cueing       General Comments General comments (skin integrity, edema, etc.): Pt on 2 L with sats 99%.  Tried RA but dropped to 92% rest.  Back on 2 L and up to 98% prior to ambulation.  Ambulating on 2 L with sats to 93% and mild DOE.    Exercises     Assessment/Plan     PT Assessment Patient does not need any further PT services  PT Problem List         PT Treatment Interventions      PT Goals (Current goals can be found in the Care Plan section)  Acute Rehab PT Goals Patient Stated Goal: return home PT Goal Formulation: All assessment and education complete, DC therapy    Frequency       Co-evaluation               AM-PAC PT 6 Clicks Mobility  Outcome Measure Help needed turning from your back to your side while in a flat bed without using bedrails?: None Help needed moving from lying on your back to sitting on the side of a flat bed without using bedrails?: None Help needed moving to and from a bed to a chair (including a wheelchair)?: A Little Help needed standing up from a chair using your arms (e.g., wheelchair or bedside chair)?: A Little Help needed to walk in hospital room?: A Little Help needed climbing 3-5 steps with a railing? : A Little 6 Click Score: 20    End of Session Equipment Utilized During Treatment: Gait belt Activity Tolerance: Patient tolerated treatment well Patient left: in chair;with call bell/phone within reach Nurse Communication: Mobility status PT Visit Diagnosis: Other abnormalities of gait and mobility (R26.89)    Time: 8886-8867 PT Time Calculation (min) (ACUTE ONLY): 19 min   Charges:   PT Evaluation $PT Eval Low Complexity: 1 Low   PT General Charges $$ ACUTE PT VISIT: 1 Visit         Benjiman, PT Acute Rehab Services [facility] [phone]   Benjiman VEAR Mulberry 09/07/2024, 11:43 AM

## 2024-09-07 NOTE — Progress Notes (Signed)
 PROGRESS NOTE    Paul [name]  FMW:[id number] DOB: 04-20-1946 DOA: 09/06/2024 PCP: [name], MD   Brief Narrative:  Paul [name] is a 78 y.o. male with history of COPD on 2 L home oxygen , sleep apnea, diastolic CHF, hypertension, A-fib status post Watchman device placement, diabetes mellitus type 2, anemia, hypothyroidism, GERD presents to the ER with complaints of shortness of breath.  Patient states over the last 3 days patient has been getting more short of breath than usual.  Patient admitted for COPD exacerbation.  Started on IV methylprednisolone , nebulizer therapy, and Abx.  Assessment and Plan:  Acute COPD Exacerbation with Chronic Respiratory Failure w/ Hypoxia on 2 Liters: Has wheezing bilaterally in the posterior Lung Fields.  C/w Duobneb 3 mL BID scheduled and q2hprn nebulizer as  well as Breztri . C/w Antibiotics w/ Doxycycline  and IV Solu-Medrol  but increase to 60 mg BID. Patient is at home on 2 L oxygen . CTM Respiratory Status carefully. Repeat CXR Negative for acute cardiopulmonary disease. Obtain PT/OT to Evaluate. Will need Ambulatory Home O2 Screen. Add Flutter Valve, Incentive Spirometry, and Guaifenesin  1200 mg po BID. WBC went from 11.6 -> 8.7.   Chronic HFpEF: Compensated and appears Euvolemic. Last EF measured was 65 to 70% with G1DD on February 2024. Continue p.o Furosemide  40 mg po Daily and Dapaglilozin 10 mg po Daily. proBNP was 259.0.   History of Paroxysmal Atrial Fibrillation: S/p Watchman device placement. C/w Digoxin  125 mcg po Daily. Digoxin  Level was 1.0  Essential Hypertension, uncontrolled: C/w Amlodipine  2.5 mg po Daily. CTM BP per Protocol and May need to increase Amlodipine  dose if still persistently elevated. Last BP reading was 147/62.  Diabetes Mellitus Type 2: C/w Dapagliflozin  Propanediol 10 mg po Daily. Last hemoglobin A1c was 6.3 ~ 2 months ago.  Also placed on a sensitive NovoLog /scale insulin  AC.  Expect blood sugars to be elevated in the  setting of steroid to margination. CTM CBGs per Protocol. CBG was 126 on last check.  Lactic Acidosis: Mild and improving. LA went from 2.5 -> 2.1. Repeat in the AM  Hypothyroidism: Check TSH in the AM. C/w Levothyroxine  25 mcg po Daily   Obstructive Sleep Apnea: C/w CPAP at bedtime.  HLD: C/w Atorvastatin  40 mg po qHS  Microcytic Anemia: Hgb/Hct Trend:  Recent Labs  Lab 08/26/24 1529 09/06/24 1723 09/06/24 2237 09/07/24 0502  HGB 12.0* 12.5* 11.6* 11.8*  HCT 41.1 42.4 40.9 42.6  MCV 76.0* 76.8* 79.1* 79.3*  -C/w Ferrous Sulfate  325 mg po Daily. Check Anemia Panel in the AM. CTM for S/Sx of Bleeding; No overt bleeding noted. Repeat CBC in the AM  GERD/GI Prophylaxis: Continue PPI w/ Pantoprazole  40 mg po BID  Overweight: Complicates overall prognosis and care. Estimated body mass index is 26.01 kg/m as calculated from the following:   Height as of this encounter: 5' 5 (1.651 m).   Weight as of this encounter: 70.9 kg. Weight Loss and Dietary Counseling given   DVT prophylaxis: enoxaparin  (LOVENOX ) injection 40 mg Start: 09/06/24 2215    Code Status: Full Code Family Communication: D/w wife @ bedside  Disposition Plan:  Level of care: Telemetry Status is: Inpatient Remains inpatient appropriate because: Needs further clinical improvement in his respiratory status.  Patient is a good candidate for hospital at home program   Consultants:  None  Procedures:  As delineated as above  Antimicrobials:  Anti-infectives (From admission, onward)    Start     Dose/Rate Route Frequency Ordered Stop  09/07/24 2200  doxycycline  (VIBRA -TABS) tablet 100 mg        100 mg Oral Every 12 hours 09/07/24 1107     09/07/24 2100  doxycycline  (VIBRA -TABS) tablet 100 mg  Status:  Discontinued        100 mg Oral Every 12 hours 09/07/24 1058 09/07/24 1107   09/06/24 2215  doxycycline  (VIBRAMYCIN ) 100 mg in sodium chloride  0.9 % 250 mL IVPB  Status:  Discontinued        100 mg 125 mL/hr  over 120 Minutes Intravenous Every 12 hours 09/06/24 2127 09/07/24 1058       Subjective: Seen and examined at bedside and thinks he is doing little bit better but still not close to his baseline.  States that he still feels short of breath especially when he tries to ambulate.  No nausea or vomiting.  Still wheezing.  He denies any other concerns or complaints at this time.  Objective: Vitals:   09/07/24 0751 09/07/24 0904 09/07/24 0907 09/07/24 1026  BP:   (!) 153/59 (!) 147/62  Pulse:  79 77 80  Resp:  19    Temp:  97.8 F (36.6 C)    TempSrc:  Oral    SpO2: 99% 98% 100%   Weight:      Height:        Intake/Output Summary (Last 24 hours) at 09/07/2024 1400 Last data filed at 09/07/2024 0700 Gross per 24 hour  Intake 537.57 ml  Output 900 ml  Net -362.43 ml   Filed Weights   09/06/24 1715 09/06/24 2013  Weight: 73.8 kg 70.9 kg   Examination: Physical Exam:  Constitutional: WN/WD, overweight elderly chronically ill-appearing Caucasian male in no acute distress Respiratory: Diminished to auscultation bilaterally with some coarse breath sounds and does have wheezing noted on his posterior lung fields.  No appreciable rales or crackles.  Mild rhonchi noted. Normal respiratory effort and patient is not tachypenic. No accessory muscle use.  Wearing supplemental oxygen  via nasal cannula Cardiovascular: RRR, no murmurs / rubs / gallops. S1 and S2 auscultated. No extremity edema.  Abdomen: Soft, non-tender, slightly distended secondary body habitus. Bowel sounds positive.  GU: Deferred. Musculoskeletal: No clubbing / cyanosis of digits/nails. No joint deformity upper and lower extremities. Skin: No rashes, lesions, ulcers on a limited skin evaluation. No induration; Warm and dry.  Neurologic: CN 2-12 grossly intact with no focal deficits. Romberg sign and cerebellar reflexes not assessed.  Psychiatric: Normal judgment and insight. Alert and oriented x 3. Normal mood and appropriate  affect.   Data Reviewed: I have personally reviewed following labs and imaging studies  CBC: Recent Labs  Lab 09/06/24 1723 09/06/24 2237 09/07/24 0502  WBC 11.2* 11.6* 8.7  NEUTROABS 9.6*  --   --   HGB 12.5* 11.6* 11.8*  HCT 42.4 40.9 42.6  MCV 76.8* 79.1* 79.3*  PLT 304 264 293   Basic Metabolic Panel: Recent Labs  Lab 09/06/24 1723 09/06/24 2237 09/07/24 0502  NA 141  --  143  K 4.7  --  4.9  CL 100  --  103  CO2 29  --  26  GLUCOSE 140*  --  158*  BUN 15  --  18  CREATININE 0.97 0.95 0.88  CALCIUM  9.6  --  9.7   GFR: Estimated Creatinine Clearance: 59.2 mL/min (by C-G formula based on SCr of 0.88 mg/dL). Liver Function Tests: Recent Labs  Lab 09/06/24 1723 09/07/24 0502  AST 29 25  ALT 41 36  ALKPHOS 97 86  BILITOT 0.3 0.2  PROT 7.0 6.6  ALBUMIN  4.4 4.1   No results for input(s): LIPASE, AMYLASE in the last 168 hours. No results for input(s): AMMONIA in the last 168 hours. Coagulation Profile: No results for input(s): INR, PROTIME in the last 168 hours. Cardiac Enzymes: No results for input(s): CKTOTAL, CKMB, CKMBINDEX, TROPONINI in the last 168 hours. BNP (last 3 results) Recent Labs    08/26/24 1529 09/07/24 0502  PROBNP 250.0 259.0   HbA1C: No results for input(s): HGBA1C in the last 72 hours. CBG: Recent Labs  Lab 09/07/24 0743  GLUCAP 126*   Lipid Profile: No results for input(s): CHOL, HDL, LDLCALC, TRIG, CHOLHDL, LDLDIRECT in the last 72 hours. Thyroid  Function Tests: No results for input(s): TSH, T4TOTAL, FREET4, T3FREE, THYROIDAB in the last 72 hours. Anemia Panel: No results for input(s): VITAMINB12, FOLATE, FERRITIN, TIBC, IRON, RETICCTPCT in the last 72 hours. Sepsis Labs: Recent Labs  Lab 09/06/24 1732 09/06/24 1933  LATICACIDVEN 2.5* 2.1*   Recent Results (from the past 240 hours)  Resp panel by RT-PCR (RSV, Flu A&B, Covid) Anterior Nasal Swab     Status: None    Collection Time: 09/06/24  5:32 PM   Specimen: Anterior Nasal Swab  Result Value Ref Range Status   SARS Coronavirus 2 by RT PCR NEGATIVE NEGATIVE Final    Comment: (NOTE) SARS-CoV-2 target nucleic acids are NOT DETECTED.  The SARS-CoV-2 RNA is generally detectable in upper respiratory specimens during the acute phase of infection. The lowest concentration of SARS-CoV-2 viral copies this assay can detect is 138 copies/mL. A negative result does not preclude SARS-Cov-2 infection and should not be used as the sole basis for treatment or other patient management decisions. A negative result may occur with  improper specimen collection/handling, submission of specimen other than nasopharyngeal swab, presence of viral mutation(s) within the areas targeted by this assay, and inadequate number of viral copies(<138 copies/mL). A negative result must be combined with clinical observations, patient history, and epidemiological information. The expected result is Negative.  Fact Sheet for Patients:  BloggerCourse.com  Fact Sheet for Healthcare Providers:  SeriousBroker.it  This test is no t yet approved or cleared by the [place]  FDA and  has been authorized for detection and/or diagnosis of SARS-CoV-2 by FDA under an Emergency Use Authorization (EUA). This EUA will remain  in effect (meaning this test can be used) for the duration of the COVID-19 declaration under Section 564(b)(1) of the Act, 21 U.S.C.section 360bbb-3(b)(1), unless the authorization is terminated  or revoked sooner.       Influenza A by PCR NEGATIVE NEGATIVE Final   Influenza B by PCR NEGATIVE NEGATIVE Final    Comment: (NOTE) The Xpert Xpress SARS-CoV-2/FLU/RSV plus assay is intended as an aid in the diagnosis of influenza from Nasopharyngeal swab specimens and should not be used as a sole basis for treatment. Nasal washings and aspirates are unacceptable for  Xpert Xpress SARS-CoV-2/FLU/RSV testing.  Fact Sheet for Patients: BloggerCourse.com  Fact Sheet for Healthcare Providers: SeriousBroker.it  This test is not yet approved or cleared by the [place]  FDA and has been authorized for detection and/or diagnosis of SARS-CoV-2 by FDA under an Emergency Use Authorization (EUA). This EUA will remain in effect (meaning this test can be used) for the duration of the COVID-19 declaration under Section 564(b)(1) of the Act, 21 U.S.C. section 360bbb-3(b)(1), unless the authorization is terminated or revoked.     Resp Syncytial Virus by PCR  NEGATIVE NEGATIVE Final    Comment: (NOTE) Fact Sheet for Patients: BloggerCourse.com  Fact Sheet for Healthcare Providers: SeriousBroker.it  This test is not yet approved or cleared by the [place]  FDA and has been authorized for detection and/or diagnosis of SARS-CoV-2 by FDA under an Emergency Use Authorization (EUA). This EUA will remain in effect (meaning this test can be used) for the duration of the COVID-19 declaration under Section 564(b)(1) of the Act, 21 U.S.C. section 360bbb-3(b)(1), unless the authorization is terminated or revoked.  Performed at [facility], [address] [place], [place] [address]    Radiology Studies: DG CHEST PORT 1 VIEW Result Date: 09/07/2024 EXAM: 1 VIEW XRAY OF THE CHEST 09/07/2024 08:48:25 AM COMPARISON: 09/06/2024 CLINICAL HISTORY: SOB (shortness of breath) [id number]. SOB FINDINGS: LUNGS AND PLEURA: No focal pulmonary opacity. No pulmonary edema. No pleural effusion. No pneumothorax. 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 Aortic atherosclerosis. BONES AND SOFT TISSUES: No acute osseous abnormality. IMPRESSION: 1. No acute findings. Electronically signed by: Norman Gatlin MD 09/07/2024 10:33 AM EDT RP Workstation:  [id number]   DG Chest Portable 1 View Result Date: 09/06/2024 CLINICAL DATA:  Hypoxia.  Shortness of breath. EXAM: PORTABLE CHEST 1 VIEW COMPARISON:  08/26/2024 FINDINGS: Heart size is normal. Evidence for a left atrial appendage occlusion device. Atherosclerotic calcifications at the aortic arch. Trachea is midline. Lungs are clear without focal airspace disease or pulmonary edema. Negative for a pneumothorax. No acute bone abnormality. IMPRESSION: No active disease. Electronically Signed   By: Juliene Balder M.D.   On: 09/06/2024 17:54   Scheduled Meds:  amLODipine   2.5 mg Oral Daily   aspirin  EC  81 mg Oral Daily   atorvastatin   40 mg Oral QHS   budesonide -glycopyrrolate -formoterol   2 puff Inhalation BID   dapagliflozin  propanediol  10 mg Oral Daily   digoxin   125 mcg Oral Daily   doxycycline   100 mg Oral Q12H   enoxaparin  (LOVENOX ) injection  40 mg Subcutaneous Q24H   [START ON 09/08/2024] ferrous sulfate   325 mg Oral Q supper   furosemide   40 mg Oral Daily   guaiFENesin   1,200 mg Oral BID   insulin  aspart  0-9 Units Subcutaneous TID WC   ipratropium-albuterol   3 mL Nebulization BID   levothyroxine   25 mcg Oral Q0600   melatonin  12 mg Oral QHS   methylPREDNISolone  (SOLU-MEDROL ) injection  60 mg Intravenous Q12H   pantoprazole   40 mg Oral BID   Continuous Infusions:   LOS: 1 day   Alejandro Marker, DO [facility] Available via Epic secure chat 7am-7pm After these hours, please refer to coverage provider listed on [email or web address] 09/07/2024, 2:00 PM

## 2024-09-08 ENCOUNTER — Inpatient Hospital Stay (HOSPITAL_COMMUNITY)

## 2024-09-08 DIAGNOSIS — I480 Paroxysmal atrial fibrillation: Secondary | ICD-10-CM | POA: Diagnosis not present

## 2024-09-08 DIAGNOSIS — E1159 Type 2 diabetes mellitus with other circulatory complications: Secondary | ICD-10-CM | POA: Diagnosis not present

## 2024-09-08 DIAGNOSIS — E1169 Type 2 diabetes mellitus with other specified complication: Secondary | ICD-10-CM | POA: Diagnosis not present

## 2024-09-08 DIAGNOSIS — J441 Chronic obstructive pulmonary disease with (acute) exacerbation: Secondary | ICD-10-CM | POA: Diagnosis not present

## 2024-09-08 LAB — CBC WITH DIFFERENTIAL/PLATELET
Abs Immature Granulocytes: 0.06 K/uL (ref 0.00–0.07)
Basophils Absolute: 0 K/uL (ref 0.0–0.1)
Basophils Relative: 0 %
Eosinophils Absolute: 0 K/uL (ref 0.0–0.5)
Eosinophils Relative: 0 %
HCT: 42 % (ref 39.0–52.0)
Hemoglobin: 12 g/dL — ABNORMAL LOW (ref 13.0–17.0)
Immature Granulocytes: 0 %
Lymphocytes Relative: 5 %
Lymphs Abs: 0.8 K/uL (ref 0.7–4.0)
MCH: 22.5 pg — ABNORMAL LOW (ref 26.0–34.0)
MCHC: 28.6 g/dL — ABNORMAL LOW (ref 30.0–36.0)
MCV: 78.8 fL — ABNORMAL LOW (ref 80.0–100.0)
Monocytes Absolute: 0.7 K/uL (ref 0.1–1.0)
Monocytes Relative: 5 %
Neutro Abs: 13.3 K/uL — ABNORMAL HIGH (ref 1.7–7.7)
Neutrophils Relative %: 90 %
Platelets: 299 K/uL (ref 150–400)
RBC: 5.33 MIL/uL (ref 4.22–5.81)
RDW: 21.8 % — ABNORMAL HIGH (ref 11.5–15.5)
Smear Review: NORMAL
WBC: 14.8 K/uL — ABNORMAL HIGH (ref 4.0–10.5)
nRBC: 0 % (ref 0.0–0.2)

## 2024-09-08 LAB — RESPIRATORY PANEL BY PCR

## 2024-09-08 LAB — COMPREHENSIVE METABOLIC PANEL WITH GFR
ALT: 35 U/L (ref 0–44)
AST: 20 U/L (ref 15–41)
Albumin: 4 g/dL (ref 3.5–5.0)
Alkaline Phosphatase: 80 U/L (ref 38–126)
Anion gap: 13 (ref 5–15)
BUN: 30 mg/dL — ABNORMAL HIGH (ref 8–23)
CO2: 26 mmol/L (ref 22–32)
Calcium: 9.4 mg/dL (ref 8.9–10.3)
Chloride: 104 mmol/L (ref 98–111)
Creatinine, Ser: 1.06 mg/dL (ref 0.61–1.24)
GFR, Estimated: >60 mL/min (ref >60)
Glucose, Bld: 110 mg/dL — ABNORMAL HIGH (ref 70–99)
Potassium: 4.5 mmol/L (ref 3.5–5.1)
Sodium: 143 mmol/L (ref 135–145)
Total Bilirubin: 0.3 mg/dL (ref 0.0–1.2)
Total Protein: 6.5 g/dL (ref 6.5–8.1)

## 2024-09-08 LAB — GLUCOSE, CAPILLARY
Glucose-Capillary: 145 mg/dL — ABNORMAL HIGH (ref 70–99)
Glucose-Capillary: 154 mg/dL — ABNORMAL HIGH (ref 70–99)

## 2024-09-08 LAB — PHOSPHORUS: Phosphorus: 4.9 mg/dL — ABNORMAL HIGH (ref 2.5–4.6)

## 2024-09-08 LAB — MAGNESIUM: Magnesium: 2.5 mg/dL — ABNORMAL HIGH (ref 1.7–2.4)

## 2024-09-08 LAB — TSH: TSH: 0.495 u[IU]/mL (ref 0.350–4.500)

## 2024-09-08 MED ORDER — IPRATROPIUM-ALBUTEROL 0.5-2.5 (3) MG/3ML IN SOLN
3.0000 mL | RESPIRATORY_TRACT | Status: DC | PRN
  Filled 2024-09-08 (×6): qty 3

## 2024-09-08 MED ORDER — SERTRALINE HCL 50 MG PO TABS
50.0000 mg | ORAL_TABLET | Freq: Every day | ORAL | Status: DC
  Administered 2024-09-08 – 2024-09-10 (×3): 50 mg via ORAL
  Filled 2024-09-08 (×4): qty 1

## 2024-09-08 MED ORDER — METHYLPREDNISOLONE SODIUM SUCC 40 MG IJ SOLR
40.0000 mg | Freq: Two times a day (BID) | INTRAMUSCULAR | Status: DC
  Administered 2024-09-08: 40 mg via INTRAVENOUS
  Filled 2024-09-08 (×2): qty 1

## 2024-09-08 MED ORDER — METHYLPREDNISOLONE SODIUM SUCC 125 MG IJ SOLR
80.0000 mg | INTRAMUSCULAR | Status: DC
  Administered 2024-09-09 – 2024-09-10 (×2): 80 mg via INTRAVENOUS
  Filled 2024-09-08 (×2): qty 1.28

## 2024-09-08 NOTE — Progress Notes (Signed)
 Video call completed with patient and all bedtime medications were given with out any issue. Patient continues to appear stable with no complaints at this time. Plan is for patient to sleep with CPAP overnight. Patient encouraged to reach out to RN if any issues arise.

## 2024-09-08 NOTE — Progress Notes (Signed)
 Mobility Specialist - Progress Note   09/08/24 1000  Mobility  Activity Ambulated independently  Level of Assistance Independent after set-up  Assistive Device None  Distance Ambulated (ft) 350 ft  Range of Motion/Exercises Active  Activity Response Tolerated well  Mobility Referral Yes  Mobility visit 1 Mobility  Mobility Specialist Start Time (ACUTE ONLY) 1000  Mobility Specialist Stop Time (ACUTE ONLY) 1010  Mobility Specialist Time Calculation (min) (ACUTE ONLY) 10 min   Received in bed and agreed to mobility. On 2L of oxygen , no issues throughout session, returned to chair with all needs met.  Cyndee Ada Mobility Specialist

## 2024-09-08 NOTE — Progress Notes (Signed)
 This Clinical research associate arrived at [facility] at 1443, [address] [place] room 1504 to find Mr. Yankee sitting up in his recliner, ready to go home. The patient had all of his personal belongings, cell phone, charger, items the hospital staff had given him in a bag sitting on his bed. Patient jumped up and sat in this writers wheelchair. This Clinical research associate unhooked his tele box, handed it to the nurse and pushed patient to the elevator. Patient was loaded into transport van, secured by 2 front hooks, 2 rear hooks, 2 wheelchair locks and shoulder/lap seatbelt. Patient was transported home with no issues. Patient was pushed in wheelchair to the front door and patient stood up and walked inside. This Clinical research associate made sure patient was ok, sitting at the kitchen table, retrieved all of the equipment from car. The team Paramedic also present started assessing the patient, all vital signs and weight taken, uploaded into EPIC, spoke with virtual RN, medications verified. Patient has a skin tear on his right forearm from nursing staff using tape on the IV sight. Took a picture, uploaded into EPIC, team Paramedic applied treatment to the site. All of the patient  and patients wife's questions were answered, and this writer left. ------------------------------------- Nat SQUIBB. Franchot, EMT

## 2024-09-08 NOTE — Progress Notes (Signed)
 Video call completed with patient. Introduced myself as the Charity fundraiser for the evening and reviewed the different methods I could be reached overnight. Administered scheduled inhaler and breathing treatment with patient. Patient appears stable and has no complaints at this time. Plan made to call back for bedtime medications around 21:00 pm.

## 2024-09-08 NOTE — Plan of Care (Signed)
  Problem: Education: Goal: Knowledge of General Education information will improve Description: Including pain rating scale, medication(s)/side effects and non-pharmacologic comfort measures Outcome: Progressing   Problem: Health Behavior/Discharge Planning: Goal: Ability to manage health-related needs will improve Outcome: Progressing   Problem: Clinical Measurements: Goal: Ability to maintain clinical measurements within normal limits will improve Outcome: Progressing Goal: Will remain free from infection Outcome: Progressing Goal: Diagnostic test results will improve Outcome: Progressing Goal: Respiratory complications will improve Outcome: Progressing Goal: Cardiovascular complication will be avoided Outcome: Progressing   Problem: Activity: Goal: Risk for activity intolerance will decrease Outcome: Progressing   Problem: Nutrition: Goal: Adequate nutrition will be maintained Outcome: Progressing   Problem: Coping: Goal: Level of anxiety will decrease Outcome: Progressing   Problem: Elimination: Goal: Will not experience complications related to bowel motility Outcome: Progressing Goal: Will not experience complications related to urinary retention Outcome: Progressing   Problem: Pain Managment: Goal: General experience of comfort will improve and/or be controlled Outcome: Progressing   Problem: Safety: Goal: Ability to remain free from injury will improve Outcome: Progressing   Problem: Skin Integrity: Goal: Risk for impaired skin integrity will decrease Outcome: Progressing   Problem: Education: Goal: Ability to describe self-care measures that may prevent or decrease complications (Diabetes Survival Skills Education) will improve Outcome: Progressing Goal: Individualized Educational Video(s) Outcome: Progressing   Problem: Coping: Goal: Ability to adjust to condition or change in health will improve Outcome: Progressing   Problem: Fluid  Volume: Goal: Ability to maintain a balanced intake and output will improve Outcome: Progressing   Problem: Health Behavior/Discharge Planning: Goal: Ability to identify and utilize available resources and services will improve Outcome: Progressing Goal: Ability to manage health-related needs will improve Outcome: Progressing   Problem: Metabolic: Goal: Ability to maintain appropriate glucose levels will improve Outcome: Progressing   Problem: Nutritional: Goal: Maintenance of adequate nutrition will improve Outcome: Progressing Goal: Progress toward achieving an optimal weight will improve Outcome: Progressing   Problem: Skin Integrity: Goal: Risk for impaired skin integrity will decrease Outcome: Progressing   Problem: Tissue Perfusion: Goal: Adequacy of tissue perfusion will improve Outcome: Progressing   Problem: Activity: Goal: Ability to tolerate increased activity will improve Outcome: Progressing Goal: Will verbalize the importance of balancing activity with adequate rest periods Outcome: Progressing   Problem: Respiratory: Goal: Ability to maintain a clear airway will improve Outcome: Progressing

## 2024-09-08 NOTE — TOC Initial Note (Addendum)
 Transition of Care [facility]) - Initial/Assessment Note    Patient Details  Name: Paul [name] MRN: [id number] Date of Birth: [birth year]  Transition of Care [facility]) CM/SW Contact:    Rosalva Jon Bloch, RN Phone Number: 09/08/2024, 3:14 PM  Clinical Narrative:                    - COPD exacerbation Pt transition to Hospital [facility] Home program , 09/08/2024. Pt resides with supportive wife, [name]. NCM called pt by phone, explained role and spoke with pt regarding d/c planning. Pt states he's retired.  PTA independent with ADL's. Uses oxygen  @ hs, 2l/min [place] ( [employer]).  Confirmed PCP: Kirstin Cox. Pt without RX med concerns or transportation issues.  Inpatient Care Management team following and will  assist as needs present.  Expected Discharge Plan: Home/Self Care Barriers to Discharge: Continued Medical Work up   Patient Goals and CMS Choice            Expected Discharge Plan and Services   Discharge Planning Services: CM Consult   Living arrangements for the past 2 months: Single Family Home                                      Prior Living Arrangements/Services Living arrangements for the past 2 months: Single Family Home Lives with:: Spouse Patient language and need for interpreter reviewed:: Yes Do you feel safe going back to the place where you live?: Yes (H@H )      Need for Family Participation in Patient Care: Yes (Comment) Care giver support system in place?: Yes (comment) Current home services: DME (oxygen  / [employer]) Criminal Activity/Legal Involvement Pertinent to Current Situation/Hospitalization: No - Comment as needed  Activities of Daily Living   ADL Screening (condition at time of admission) Independently performs ADLs?: Yes (appropriate for developmental age) Is the patient deaf or have difficulty hearing?: No Does the patient have difficulty seeing, even when wearing glasses/contacts?: No Does the patient have difficulty concentrating,  remembering, or making decisions?: No  Permission Sought/Granted                  Emotional Assessment       Orientation: : Oriented to Self, Oriented to Place, Oriented to  Time, Oriented to Situation Alcohol / Substance Use: Not Applicable Psych Involvement: No (comment)  Admission diagnosis:  Shortness of breath [R06.02] Wheezing [R06.2] Hypoxia [R09.02] COPD exacerbation (HCC) [J44.1] COPD with acute exacerbation (HCC) [J44.1] Acute cough [R05.1] Patient Active Problem List   Diagnosis Date Noted   COPD exacerbation (HCC) 09/06/2024   COPD (chronic obstructive pulmonary disease) (HCC) 09/02/2024   COPD with acute exacerbation (HCC) 08/30/2024   Presence of heart assist device (HCC) 07/08/2024   Abnormal TSH 04/05/2024   Encounter for Medicare annual wellness exam 04/04/2024   Syncope and collapse 08/29/2023   Laceration of left thigh 08/29/2023   Age-related osteoporosis without current pathological fracture 07/26/2023   Secondary hypercoagulable state (HCC) 04/02/2023   OSA (obstructive sleep apnea) 03/26/2023   Hypoxia 02/04/2023   Elevated d-dimer 01/25/2023   Iron 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 08/16/2022   Olecranon bursitis of right elbow 05/16/2022   Syncope 04/08/2022   GERD (gastroesophageal reflux disease) / GI protection on antiplatelet therapy 12/27/2021   Lumbar back pain 11/17/2021   Oxygen  dependent 10/09/2021   Paroxysmal atrial fibrillation (HCC) 08/02/2021   Chronic  respiratory failure with hypoxia (HCC) 01/16/2021   GAD (generalized anxiety disorder) 01/14/2021   Chronic diastolic CHF (congestive heart failure) (HCC) 04/01/2020   Age-related osteoporosis with healing pathological fracture, chronic back pain 02/06/2020   Hypertension associated with diabetes (HCC) 01/28/2020   Chronic bilateral thoracic back pain 01/25/2020   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HCC) 01/25/2020   Absolute anemia  05/29/2019   Pulmonary nodules 05/29/2019   Coronary artery calcification 04/06/2019   Atherosclerosis of aorta (HCC) 04/06/2019   Former cigarette smoker 12/05/2015   COPD GOLD  III     PCP:  [name], MD Pharmacy:   [facility] [phone] - [place], [place] - [address] [place] [address] Phone: [phone] Fax: [phone]     Social Drivers of Health (SDOH) Social History: SDOH Screenings   Food Insecurity: No Food Insecurity (09/06/2024)  Housing: Low Risk  (09/06/2024)  Transportation Needs: No Transportation Needs (09/06/2024)  Utilities: Not At Risk (09/06/2024)  Alcohol Screen: Low Risk  (07/26/2024)  Depression (PHQ2-9): Low Risk  (07/26/2024)  Financial Resource Strain: Low Risk  (07/19/2023)  Physical Activity: Insufficiently Active (07/19/2023)  Social Connections: Socially Integrated (09/06/2024)  Stress: No Stress Concern Present (07/19/2023)  Tobacco Use: Medium Risk (09/06/2024)  Health Literacy: Adequate Health Literacy (07/23/2023)   SDOH Interventions:     Readmission Risk Interventions    09/07/2024    3:38 PM 02/18/2023    3:55 PM 01/27/2023    3:27 PM  Readmission Risk Prevention Plan  Transportation Screening Complete Complete Complete  PCP or Specialist Appt within 5-7 Days Complete    Home Care Screening Complete    Medication Review (RN CM) Complete    HRI or Home Care Consult   Complete  Social Work Consult for Recovery Care Planning/Counseling   Complete  Palliative Care Screening   Not Applicable  Medication Review Oceanographer)  Referral to Pharmacy Referral to Pharmacy  HRI or Home Care Consult  Complete   SW Recovery Care/Counseling Consult  Complete   Palliative Care Screening  Complete   Skilled Nursing Facility  Not Applicable

## 2024-09-08 NOTE — Progress Notes (Signed)
 Completed [facility] preadmission navigators. Pt already consented.   Pt reports no animals, does have guns that are locked in safe, lives with his wife who is agreeable to program and has already been informed by Dr. Eldonna details of same. Pt lives in a one story town house that we can enter through the front door for admission and routine visits. Pt is non-smoker and his wife is non-smoker as well. Pt will need medium size peripheral kit. Pt informed we will be back within 2 hours to pick him up for admission.

## 2024-09-08 NOTE — Progress Notes (Signed)
 Patient transferred to the [facility] program. Communicated with patient via video tablet. AAOx4. Denies pain and SOB. Room air now. Patient states he only use supplemental oxygen  at bedtime and as needed. Plan of care reviewed with patient and spouse. Patient was told that the virtual nurse is available 24/7. [facility] phone number given to patient. Tablet usage explained. Skin check completed with Lauraine, paramedic. Patient agreeable with plan of care.

## 2024-09-08 NOTE — Progress Notes (Signed)
Size medium

## 2024-09-08 NOTE — Progress Notes (Signed)
 PROGRESS NOTE    Paul [name]  FMW:[id number] DOB: [birth year] DOA: 09/06/2024 PCP: [name], MD   Brief Narrative:  Paul [name] is a 78 y.o. male with history of COPD on 2 L home oxygen , sleep apnea, diastolic CHF, hypertension, A-fib status post Watchman device placement, diabetes mellitus type 2, anemia, hypothyroidism, GERD presents to the ER with complaints of shortness of breath.  Patient states over the last 3 days patient has been getting more short of breath than usual.  Patient admitted for COPD exacerbation.  Started on IV methylprednisolone , nebulizer therapy, and Abx and he is slowly improving w/ Treatment.   Assessment and Plan:  Acute COPD Exacerbation with Chronic Respiratory Failure w/ Hypoxia on 2 Liters: Has wheezing bilaterally in the posterior Lung Fields.  C/w Duobneb 3 mL BID scheduled and q2hprn nebulizer as  well as Breztri . C/w Antibiotics w/ Doxycycline  and IV Solu-Medrol  but increase to 60 mg BID. Patient is at home on 2 L oxygen . CTM Respiratory Status carefully. Repeat CXR Negative for acute cardiopulmonary disease. Obtain PT/OT to Evaluate. Will need Ambulatory Home O2 Screen. Add Flutter Valve, Incentive Spirometry, and Guaifenesin  1200 mg po BID. WBC went from 11.6 -> 8.7 -> 14.8 and likely in the setting of Steroid Demargination.   Chronic HFpEF: Compensated and appears Euvolemic. Last EF measured was 65 to 70% with G1DD on February 2024. Continue p.o Furosemide  40 mg po Daily and Dapaglilozin 10 mg po Daily. proBNP was 259.0.   History of Paroxysmal Atrial Fibrillation: S/p Watchman device placement. C/w Digoxin  125 mcg po Daily. Digoxin  Level was 1.0  Essential Hypertension, uncontrolled: C/w Amlodipine  2.5 mg po Daily. CTM BP per Protocol and May need to increase Amlodipine  dose if still persistently elevated. Last BP reading was 132/71.  Diabetes Mellitus Type 2: C/w Dapagliflozin  Propanediol 10 mg po Daily. Last hemoglobin A1c was 6.3 ~ 2 months ago.   Also placed on a sensitive NovoLog /scale insulin  AC.  Expect blood sugars to be elevated in the setting of steroid to margination. CTM CBGs per Protocol. CBG was 126 on last check.  Lactic Acidosis: Mild and improving. LA went from 2.5 -> 2.1. Repeat in the AM  Hypothyroidism: TSH was 0.495. C/w Levothyroxine  25 mcg po Daily   Obstructive Sleep Apnea: C/w CPAP at bedtime.  HLD: C/w Atorvastatin  40 mg po qHS  Microcytic Anemia: Hgb/Hct Trend:  Recent Labs  Lab 08/26/24 1529 09/06/24 1723 09/06/24 2237 09/07/24 0502 09/08/24 0529  HGB 12.0* 12.5* 11.6* 11.8* 12.0*  HCT 41.1 42.4 40.9 42.6 42.0  MCV 76.0* 76.8* 79.1* 79.3* 78.8*  -C/w Ferrous Sulfate  325 mg po Daily. Check Anemia Panel in the AM. CTM for S/Sx of Bleeding; No overt bleeding noted. Repeat CBC in the AM  GERD/GI Prophylaxis: Continue PPI w/ Pantoprazole  40 mg po BID  Overweight: Complicates overall prognosis and care. Estimated body mass index is 26.01 kg/m as calculated from the following:   Height as of this encounter: 5' 5 (1.651 m).   Weight as of this encounter: 70.9 kg. Weight Loss and Dietary Counseling given   DVT prophylaxis: enoxaparin  (LOVENOX ) injection 40 mg Start: 09/06/24 2215    Code Status: Full Code Family Communication: No family present @ Bedside  Disposition Plan:  Level of care: [facility] Home Med-Surg Status is: Inpatient Remains inpatient appropriate because: Improving but will be transitioned to H@H  program   Consultants:  None  Procedures:  As delineated as above  Antimicrobials:  Anti-infectives (From admission, onward)  Start     Dose/Rate Route Frequency Ordered Stop   09/07/24 2200  doxycycline  (VIBRA -TABS) tablet 100 mg        100 mg Oral Every 12 hours 09/07/24 1107     09/07/24 2100  doxycycline  (VIBRA -TABS) tablet 100 mg  Status:  Discontinued        100 mg Oral Every 12 hours 09/07/24 1058 09/07/24 1107   09/06/24 2215  doxycycline  (VIBRAMYCIN ) 100 mg in sodium  chloride 0.9 % 250 mL IVPB  Status:  Discontinued        100 mg 125 mL/hr over 120 Minutes Intravenous Every 12 hours 09/06/24 2127 09/07/24 1058       Subjective: Seen and examined at bedside and he is doing better today compared to yesterday.  States that he is able to breathe better.  No nausea or vomiting.  Denied any other concerns or complaints at this time.  Objective: Vitals:   09/07/24 1939 09/07/24 2013 09/08/24 0436 09/08/24 0909  BP:  (!) 147/54 132/71   Pulse:  76 74   Resp:  18 18   Temp:  98.1 F (36.7 C) 97.6 F (36.4 C)   TempSrc:  Oral Oral   SpO2: 100% 100% 100% 99%  Weight:      Height:        Intake/Output Summary (Last 24 hours) at 09/08/2024 1205 Last data filed at 09/08/2024 0900 Gross per 24 hour  Intake 240 ml  Output --  Net 240 ml   Filed Weights   09/06/24 1715 09/06/24 2013  Weight: 73.8 kg 70.9 kg   Examination: Physical Exam:  Constitutional: WN/WD overweight elderly Caucasian male in no acute distress Respiratory: Diminished to auscultation bilaterally with some coarse breath sounds and some expiratory wheezing noted on the posterior lung fields.  No appreciable rales or crackles.. Normal respiratory effort and patient is not tachypenic. No accessory muscle use.  Wearing supplemental oxygen  via nasal cannula Cardiovascular: RRR, no murmurs / rubs / gallops. S1 and S2 auscultated. No extremity edema.  Abdomen: Soft, non-tender, distended secondary to body habitus. Bowel sounds positive.  GU: Deferred. Musculoskeletal: No clubbing / cyanosis of digits/nails. Normal strength and muscle tone.  Skin: No rashes, lesions, ulcers on limited skin evaluation. No induration; Warm and dry.  Neurologic: CN 2-12 grossly intact with no focal deficits. Romberg sign and cerebellar reflexes not assessed.  Psychiatric: Normal judgment and insight. Alert and oriented x 3. Normal mood and appropriate affect.   Data Reviewed: I have personally reviewed  following labs and imaging studies  CBC: Recent Labs  Lab 09/06/24 1723 09/06/24 2237 09/07/24 0502 09/08/24 0529  WBC 11.2* 11.6* 8.7 14.8*  NEUTROABS 9.6*  --   --  13.3*  HGB 12.5* 11.6* 11.8* 12.0*  HCT 42.4 40.9 42.6 42.0  MCV 76.8* 79.1* 79.3* 78.8*  PLT 304 264 293 299   Basic Metabolic Panel: Recent Labs  Lab 09/06/24 1723 09/06/24 2237 09/07/24 0502 09/08/24 0529  NA 141  --  143 143  K 4.7  --  4.9 4.5  CL 100  --  103 104  CO2 29  --  26 26  GLUCOSE 140*  --  158* 110*  BUN 15  --  18 30*  CREATININE 0.97 0.95 0.88 1.06  CALCIUM  9.6  --  9.7 9.4  MG  --   --   --  2.5*  PHOS  --   --   --  4.9*   GFR: Estimated Creatinine  Clearance: 49.2 mL/min (by C-G formula based on SCr of 1.06 mg/dL). Liver Function Tests: Recent Labs  Lab 09/06/24 1723 09/07/24 0502 09/08/24 0529  AST 29 25 20   ALT 41 36 35  ALKPHOS 97 86 80  BILITOT 0.3 0.2 0.3  PROT 7.0 6.6 6.5  ALBUMIN  4.4 4.1 4.0   No results for input(s): LIPASE, AMYLASE in the last 168 hours. No results for input(s): AMMONIA in the last 168 hours. Coagulation Profile: No results for input(s): INR, PROTIME in the last 168 hours. Cardiac Enzymes: No results for input(s): CKTOTAL, CKMB, CKMBINDEX, TROPONINI in the last 168 hours. BNP (last 3 results) Recent Labs    08/26/24 1529 09/07/24 0502  PROBNP 250.0 259.0   HbA1C: No results for input(s): HGBA1C in the last 72 hours. CBG: Recent Labs  Lab 09/07/24 0743 09/07/24 1731 09/07/24 2111 09/08/24 0745  GLUCAP 126* 107* 200* 145*   Lipid Profile: No results for input(s): CHOL, HDL, LDLCALC, TRIG, CHOLHDL, LDLDIRECT in the last 72 hours. Thyroid  Function Tests: Recent Labs    09/08/24 0529  TSH 0.495   Anemia Panel: No results for input(s): VITAMINB12, FOLATE, FERRITIN, TIBC, IRON, RETICCTPCT in the last 72 hours. Sepsis Labs: Recent Labs  Lab 09/06/24 1732 09/06/24 1933  LATICACIDVEN  2.5* 2.1*    Recent Results (from the past 240 hours)  Resp panel by RT-PCR (RSV, Flu A&B, Covid) Anterior Nasal Swab     Status: None   Collection Time: 09/06/24  5:32 PM   Specimen: Anterior Nasal Swab  Result Value Ref Range Status   SARS Coronavirus 2 by RT PCR NEGATIVE NEGATIVE Final    Comment: (NOTE) SARS-CoV-2 target nucleic acids are NOT DETECTED.  The SARS-CoV-2 RNA is generally detectable in upper respiratory specimens during the acute phase of infection. The lowest concentration of SARS-CoV-2 viral copies this assay can detect is 138 copies/mL. A negative result does not preclude SARS-Cov-2 infection and should not be used as the sole basis for treatment or other patient management decisions. A negative result may occur with  improper specimen collection/handling, submission of specimen other than nasopharyngeal swab, presence of viral mutation(s) within the areas targeted by this assay, and inadequate number of viral copies(<138 copies/mL). A negative result must be combined with clinical observations, patient history, and epidemiological information. The expected result is Negative.  Fact Sheet for Patients:  BloggerCourse.com  Fact Sheet for Healthcare Providers:  SeriousBroker.it  This test is no t yet approved or cleared by the [place]  FDA and  has been authorized for detection and/or diagnosis of SARS-CoV-2 by FDA under an Emergency Use Authorization (EUA). This EUA will remain  in effect (meaning this test can be used) for the duration of the COVID-19 declaration under Section 564(b)(1) of the Act, 21 U.S.C.section 360bbb-3(b)(1), unless the authorization is terminated  or revoked sooner.       Influenza A by PCR NEGATIVE NEGATIVE Final   Influenza B by PCR NEGATIVE NEGATIVE Final    Comment: (NOTE) The Xpert Xpress SARS-CoV-2/FLU/RSV plus assay is intended as an aid in the diagnosis of influenza  from Nasopharyngeal swab specimens and should not be used as a sole basis for treatment. Nasal washings and aspirates are unacceptable for Xpert Xpress SARS-CoV-2/FLU/RSV testing.  Fact Sheet for Patients: BloggerCourse.com  Fact Sheet for Healthcare Providers: SeriousBroker.it  This test is not yet approved or cleared by the [place]  FDA and has been authorized for detection and/or diagnosis of SARS-CoV-2 by FDA under an Emergency  Use Authorization (EUA). This EUA will remain in effect (meaning this test can be used) for the duration of the COVID-19 declaration under Section 564(b)(1) of the Act, 21 U.S.C. section 360bbb-3(b)(1), unless the authorization is terminated or revoked.     Resp Syncytial Virus by PCR NEGATIVE NEGATIVE Final    Comment: (NOTE) Fact Sheet for Patients: BloggerCourse.com  Fact Sheet for Healthcare Providers: SeriousBroker.it  This test is not yet approved or cleared by the [place]  FDA and has been authorized for detection and/or diagnosis of SARS-CoV-2 by FDA under an Emergency Use Authorization (EUA). This EUA will remain in effect (meaning this test can be used) for the duration of the COVID-19 declaration under Section 564(b)(1) of the Act, 21 U.S.C. section 360bbb-3(b)(1), unless the authorization is terminated or revoked.  Performed at [facility], [address], [place], [place] [address]     Radiology Studies: DG CHEST PORT 1 VIEW Result Date: 09/08/2024 CLINICAL DATA:  [id number].  Shortness of breath. EXAM: PORTABLE CHEST 1 VIEW COMPARISON:  Portable chest yesterday at 8:46 a.m. FINDINGS: 5:13 a.m. the cardiac size is normal. Left atrial appendage occlusion device is again noted. No vascular congestion is seen. The mediastinum is stable. There is calcification of the transverse aorta. Both lungs are emphysematous but clear.  Thoracic spondylosis with no new osseous findings. IMPRESSION: No evidence of acute chest disease. Emphysema. Aortic atherosclerosis. Stable COPD chest. Electronically Signed   By: Francis Quam M.D.   On: 09/08/2024 07:55   DG CHEST PORT 1 VIEW Result Date: 09/07/2024 EXAM: 1 VIEW XRAY OF THE CHEST 09/07/2024 08:48:25 AM COMPARISON: 09/06/2024 CLINICAL HISTORY: SOB (shortness of breath) [id number]. SOB FINDINGS: LUNGS AND PLEURA: No focal pulmonary opacity. No pulmonary edema. No pleural effusion. No pneumothorax. 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 Aortic atherosclerosis. BONES AND SOFT TISSUES: No acute osseous abnormality. IMPRESSION: 1. No acute findings. Electronically signed by: Norman Gatlin MD 09/07/2024 10:33 AM EDT RP Workstation: [id number]   DG Chest Portable 1 View Result Date: 09/06/2024 CLINICAL DATA:  Hypoxia.  Shortness of breath. EXAM: PORTABLE CHEST 1 VIEW COMPARISON:  08/26/2024 FINDINGS: Heart size is normal. Evidence for a left atrial appendage occlusion device. Atherosclerotic calcifications at the aortic arch. Trachea is midline. Lungs are clear without focal airspace disease or pulmonary edema. Negative for a pneumothorax. No acute bone abnormality. IMPRESSION: No active disease. Electronically Signed   By: Juliene Balder M.D.   On: 09/06/2024 17:54   Scheduled Meds:  amLODipine   2.5 mg Oral Daily   aspirin  EC  81 mg Oral Daily   atorvastatin   40 mg Oral QHS   budesonide -glycopyrrolate -formoterol   2 puff Inhalation BID   dapagliflozin  propanediol  10 mg Oral Daily   digoxin   125 mcg Oral Daily   doxycycline   100 mg Oral Q12H   enoxaparin  (LOVENOX ) injection  40 mg Subcutaneous Q24H   ferrous sulfate   325 mg Oral Q supper   furosemide   40 mg Oral Daily   guaiFENesin   1,200 mg Oral BID   ipratropium-albuterol   3 mL Nebulization BID   levothyroxine   25 mcg Oral Q0600   melatonin  12 mg Oral QHS   methylPREDNISolone  (SOLU-MEDROL ) injection   40 mg Intravenous Q12H   pantoprazole   40 mg Oral BID   sertraline   50 mg Oral QHS   Continuous Infusions:   LOS: 2 days   Alejandro Marker, DO [facility] Available via Epic secure chat 7am-7pm After these hours,  please refer to coverage provider listed on [email or web address] 09/08/2024, 12:05 PM

## 2024-09-08 NOTE — Plan of Care (Signed)
   Problem: Education: Goal: Knowledge of General Education information will improve Description Including pain rating scale, medication(s)/side effects and non-pharmacologic comfort measures Outcome: Progressing   Problem: Health Behavior/Discharge Planning: Goal: Ability to manage health-related needs will improve Outcome: Progressing

## 2024-09-08 NOTE — Progress Notes (Addendum)
 Hospital at Home Progress Note    Patient: Paul [name] FMW:[id number] DOB: May 13, 1946 DOA: 09/06/2024     2 DOS: the patient was seen and examined on 09/08/2024   Brief hospital course: Paul [name] is a 78 y.o. 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  Patient states over the last 3 days patient has been getting more short of breath than usual. Baseline history of COPD requiring home O2 and nucala  injections. + home inhaler use with minimal improvement in symptoms. No reported tobacco use.  Patient admitted for COPD exacerbation.  Started on IV methylprednisolone , nebulizer therapy, and Abx.  The hospital at home program was discussed with the patient as well as wife yesterday with Dr. [name]. On discussion today, the patient is agreeable to going into the program today. Consent signed.  Assessment and Plan:  Acute COPD Exacerbation with Chronic Respiratory Failure w/ Hypoxia on 2 Liters: Still with mild wheezing.  Continue with scheduled and prn duonebs as well as breztri   On oral doxy- continue  Noted to be on prednisone  chronically (10mg  daily) Will decreased solumedrol to 40mg  IV BID today- will need progressive taper Currently on 2-2.5L [place]- near baseline Will continue to monitor  Respiratory Status carefully.  Add Flutter Valve, Incentive Spirometry, and Guaifenesin  1200 mg po BID.  Monitor WBC w/ high dose steroid use   Chronic HFpEF: Compensated and appears Euvolemic. Last EF measured was 65 to 70% with G1DD on February 2024. Continue p.o Furosemide  40 mg po Daily and Dapaglilozin 10 mg po Daily. proBNP was 259.0.  Weight 73.8-->70.9kg since admission    History of Paroxysmal Atrial Fibrillation: S/p Watchman device placement. C/w Digoxin  125 mcg po Daily.  Digoxin  Level was 1.0 Rate stable at present   Essential Hypertension, uncontrolled: SBP 130s-150s  Cont home norvasc   Follow   Diabetes Mellitus Type 2: C/w Dapagliflozin  Propanediol 10 mg po Daily. Last hemoglobin A1c was 6.3 ~ 2 months ago.  On BID blood sugar checks with Hospital at Home program  Monitor sugars with steroid use   Leukocytosis: WBC 11.6-->8.7-->14.8  Suspect uptrend secondary to high dose IV steroid use  No overt signs of infection present  Will otherwise monitor with treatment and trend    Lactic Acidosis: Resolved   Hypothyroidism: Check TSH in the AM. C/w Levothyroxine  25 mcg po Daily   Obstructive Sleep Apnea: C/w CPAP at bedtime.  HLD: C/w Atorvastatin  40 mg po qHS  Microcytic Anemia: Hgb/Hct Trend:  Recent Labs  Lab 08/26/24 1529 09/06/24 1723 09/06/24 2237 09/07/24 0502  HGB 12.0* 12.5* 11.6* 11.8*  HCT 41.1 42.4 40.9 42.6  MCV 76.0* 76.8* 79.1* 79.3*  -continue Ferrous Sulfate  325 mg po Daily.    GERD/GI Prophylaxis: Continue PPI w/ Pantoprazole  40 mg po BID  Overweight: Complicates overall prognosis and care. Estimated body mass index is 26.01 kg/m as calculated from the following:   Height as of this encounter: 5' 5 (1.651 m).   Weight as of this encounter: 70.9 kg. Weight Loss and Dietary Counseling given       Subjective: At present, pt states that symptoms are mildly to moderately improved. Overall improved WOB, though with lingering cough and wheezing.   Physical Exam: Vitals:   09/07/24 1939 09/07/24 2013 09/08/24 0436 09/08/24 0909  BP:  ROLLEN)  147/54 132/71   Pulse:  76 74   Resp:  18 18   Temp:  98.1 F (36.7 C) 97.6 F (36.4 C)   TempSrc:  Oral Oral   SpO2: 100% 100% 100% 99%  Weight:      Height:       Physical Exam Constitutional:      Appearance: He is obese.  HENT:     Head: Normocephalic and atraumatic.     Nose: Nose normal.     Mouth/Throat:     Mouth: Mucous membranes are moist.  Eyes:     Pupils:  Pupils are equal, round, and reactive to light.  Cardiovascular:     Rate and Rhythm: Regular rhythm.  Pulmonary:     Effort: Pulmonary effort is normal.     Breath sounds: Wheezing present.  Abdominal:     General: Bowel sounds are normal.  Musculoskeletal:        General: Normal range of motion.  Skin:    General: Skin is warm.  Neurological:     General: No focal deficit present.  Psychiatric:        Mood and Affect: Mood normal.     Data Reviewed:  There are no new results to review at this time.  DG CHEST PORT 1 VIEW CLINICAL DATA:  [id number].  Shortness of breath.  EXAM: PORTABLE CHEST 1 VIEW  COMPARISON:  Portable chest yesterday at 8:46 a.m.  FINDINGS: 5:13 a.m. the cardiac size is normal. Left atrial appendage occlusion device is again noted. No vascular congestion is seen.  The mediastinum is stable. There is calcification of the transverse aorta. Both lungs are emphysematous but clear. Thoracic spondylosis with no new osseous findings.  IMPRESSION: No evidence of acute chest disease. Emphysema. Aortic atherosclerosis.  Stable COPD chest.  Electronically Signed   By: Francis Quam M.D.   On: 09/08/2024 07:55  Lab Results  Component Value Date   WBC 14.8 (H) 09/08/2024   HGB 12.0 (L) 09/08/2024   HCT 42.0 09/08/2024   MCV 78.8 (L) 09/08/2024   PLT 299 09/08/2024   Last metabolic panel Lab Results  Component Value Date   GLUCOSE 110 (H) 09/08/2024   NA 143 09/08/2024   K 4.5 09/08/2024   CL 104 09/08/2024   CO2 26 09/08/2024   BUN 30 (H) 09/08/2024   CREATININE 1.06 09/08/2024   GFRNONAA >60 09/08/2024   CALCIUM  9.4 09/08/2024   PHOS 4.9 (H) 09/08/2024   PROT 6.5 09/08/2024   ALBUMIN  4.0 09/08/2024   LABGLOB 2.1 07/05/2024   AGRATIO 2.0 03/21/2023   BILITOT 0.3 09/08/2024   ALKPHOS 80 09/08/2024   AST 20 09/08/2024   ALT 35 09/08/2024   ANIONGAP 13 09/08/2024    Family Communication: Plan of care and discussion about transition  into the hospital at home program discussed with patient at the bedside as well as wife over the phone.   Disposition: Status is: Inpatient Remains inpatient appropriate because: Need for continued IV steroid titration, shortness of breath, wheezing.   Planned Discharge Destination: Transfer to Hospital at home program     Hospital at Home Admission Criteria Checklist:  Formal consent explained in detail and signed at the bedside: yes Patient meets inpatient admission criteria (see below for further details) yes Is pt Medicare FFS/[employer] Medicare-Medicaid, [employer], [employer] ( required for initial launch with plan to expand)? yes Lives within 25 mil/ 30 min from [facility] within [place](pt may stay with family member during admission  who lives within 25 miles or 30 min from [facility] w/in [facility])? yes Hemodynamically stable with relatively low risk of clinical deterioration-not requiring ICU? yes Age >55? yes Does not require frequent touch-points or complex interventions/medications (ie Titrated Infusions (IV insulin , heparin  drips, vasoactive drips, use of infused or injectable controlled substances, patients on insulin )? no Any Behavioral Health comorbidities likely to increase risk for in-home care (ie Acute delirium or experiencing a marked altered mental status and cause is not a treatable condition in the home)? no Has the patient been on BIPAP during course of ED evaluation or hospitalization? no IF YES, Has the patient been off of BIPAP for >24 hours(If NO-THEN PATIENT DOES MEET INCLUSION CRITERIA)? not applicable On Room Air or Needs oxygen  at home (<6L)? is on oxygen  at 2-3 L/min per nasal cannula. Active safety concerns (ie Unable to use bedside commode independently and lacks caregiver support for safety- needs SNF placement, unable to obtain IV access)? no Has skin check been performed? Pending  Has Physical Therapy screened the patient? yes  Common admission  diagnoses including: CAP, COPD Exacerbation, Acute on chronic heart failure, Cellulitis, UTI , dehydration, acute resp failure with hypoxia (requiring <5L)   Social Screening:  - Has the family been directly contacted about Hospital at Home program with consent obtained (if yes- please document who was spoken to with name and phone number)? yes Denies significant ETOH intake? yes Does not smoke and understands may not smoke in the presence of oxygen ? yes Patient states able to use iPad/phone for communication/has family who is able to use? yes Patient has agreed to be compliant with medication and treatment regimen of the program? yes Any active drug use in patient or primary caregiver including daily dosing of methadone? no Stable home environment ( access to appropriate heating in cold conditions and/or appropriate air conditioning in hot conditions and/or no running water/electricity)? yes  No aggressive pets at home? no Firearm present?none reported  With ability or willingness to store them unloaded in a locked case for duration of hospitalization? no Ambulatory? yes  mild difficulty Bed bugs present on home evaluation? no Family support system in place? yes Patient feels safe at home and does not endorse any violence? yes Any actively decompensated behavioral health issues including agitation/aggressive behavior? no  Patient requests food to be provided by hospital home program? no PT/OT eval completed and not requiring SNF, ALF, inpatient rehab? yes  To be admitted to the Hospital at [facility] program, a patient generally must meet the following: 1. Requirement for Inpatient Level of Care: The patient's condition must necessitate an inpatient level of care. This is typically indicated by one or more of the following, depending on their specific diagnosis:  Persistent tachycardia despite appropriate treatment (e.g., for Heart Failure, UTI). Persistent tachypnea (rapid breathing) or dyspnea  (shortness of breath) that hasn't improved sufficiently with observation care (e.g., for Heart Failure, Pneumonia, Viral Illness, COVID). Hypoxemia (low oxygen  levels), such as a new need for oxygen , an increased need from baseline, or specific oxygen  saturation levels (e.g., SpO2 <90-94% depending on the condition) that persist despite observation (e.g., for Heart Failure, COPD, Pneumonia, Viral Illness, COVID). Need for Intravenous (IV) hydration due to an inability to maintain oral hydration, which persists despite observation care (e.g., for Cellulitis, UTI, Viral Illness, COVID). Specific to Heart Failure: Persistent pulmonary edema, indicated by a new oxygen  need, lack of improvement with IV diuretics, and ongoing tachypnea/dyspnea. Specific to COPD: A decrease in known baseline resting  oxygen  saturation (SpO2) by 4% or more, or an increase in pre-existing supplemental oxygen  requirements, which persists despite observation and requires continued close monitoring. Specific to Pneumonia: A Pneumonia Severity Index (PSI) class IV (moderate risk). Specific to Cellulitis: Failure of outpatient antibiotic therapy (indicated by progression or no improvement after a minimum of 48 hours on an adequate regimen) or a clinical presentation (like acuity or rapidity of progression) that requires the intensity of monitoring found in an inpatient setting. Specific to UTI: Persistence or worsening of clinical findings like fever, pain, or dehydration despite observation care; presence of significant uropathy; suspected infection of an indwelling prosthetic device, stent, implant, or graft; or pregnancy with suspected pyelonephritis.  2. Appropriateness for Hospital at Home Setting: The patient's overall clinical picture, including the severity of their illness, their care needs, and their medical history and comorbidities, must be suitable for management in the Hospital at Home environment. This essentially means  that none of the exclusion criteria (listed below) are met.  Unified Exclusion Criteria for Hospital at Home Admission: A patient would not be eligible for Hospital at Home if any of the following are present: Hemodynamic Instability: Hypotension (low blood pressure) is present. Respiratory Instability or Needs Beyond Program Capability: There is a new need for invasive or noninvasive ventilatory assistance (like BiPAP or a ventilator). Oxygenation is not sufficient, generally indicated if an FiO2 (fraction of inspired oxygen ) of 45% (which is about 6 Liters/minute via nasal cannula) or more is required to keep oxygen  saturation (SpO2) at 90% or greater. Monitoring or Procedural Needs Beyond Program Capability: There is a need for invasive monitoring, such as a pulmonary artery catheter or an arterial line. There is a need for immediate-response telemetry monitoring (for dangerous arrhythmia detection and subsequent immediate intervention). The required medication regimen is beyond the capabilities of Hospital at Home (e.g., dosing intervals are too frequent for home administration). There is a need for a procedure that cannot be performed by the Hospital at [facility] team (e.g., significant wound debridement or abscess drainage for cellulitis, or percutaneous nephrostomy for a complicated UTI). Significant Organ Dysfunction or Markers of Severe Illness: Mental status is not at baseline, or there is altered mental status suggestive of inadequate perfusion. Renal (kidney) function is unstable or showing an ongoing decline. There is evidence of inadequate perfusion, such as metabolic acidosis or myocardial ischemia. Uncompensated acidosis is present. Condition-Specific Severity or Complications Making Home Care Unsuitable: For Heart Failure: Known severe cardiac valvular disease (e.g., aortic stenosis, mitral regurgitation); or severe peripheral edema that impairs the ability to urinate or  ambulate. For COPD: Known concurrent comorbidity or finding that indicates a higher-risk COPD exacerbation (e.g., pulmonary fibrosis, cavitation, pleural effusion, pneumothorax, rib fracture). For Pneumonia: Pneumonia Severity Index (PSI) class V (indicating high risk for inpatient mortality); known concurrent comorbidity or finding that indicates higher-risk pneumonia (e.g., pulmonary fibrosis, cavitation, large or loculated pleural effusion); or a concomitant serious infectious process like endocarditis or empyema. For Cellulitis: Orbital, periorbital, or necrotizing infection is suspected; or a concomitant serious infectious process like endocarditis, septic emboli, or septic joint space infection. For UTI: Urinary tract obstruction (e.g., kidney stone, bladder outlet obstruction); or a concomitant serious infectious process like endocarditis or septic emboli. For Viral Illness & COVID-19: A concomitant serious infectious process like endocarditis or empyema.  General Comorbidities or Status:  The patient is significantly immunosuppressed (this applies to Pneumonia, Cellulitis, UTI, Viral Illness, and COVID-19). The patient meets inpatient admission criteria for a second diagnosis, or has  care needs beyond the capabilities of Hospital at Home due to an active clinically significant comorbidity. (This is a general exclusion across all listed conditions)  Time spent: >60 minutes  Author: Elspeth JINNY Masters, MD 09/08/2024 9:14 AM  For on call review [email or web address].

## 2024-09-08 NOTE — Plan of Care (Signed)
  Problem: Education: Goal: Knowledge of General Education information will improve Description: Including pain rating scale, medication(s)/side effects and non-pharmacologic comfort measures Outcome: Progressing   Problem: Health Behavior/Discharge Planning: Goal: Ability to manage health-related needs will improve Outcome: Progressing   Problem: Clinical Measurements: Goal: Ability to maintain clinical measurements within normal limits will improve Outcome: Progressing Goal: Respiratory complications will improve Outcome: Progressing Goal: Cardiovascular complication will be avoided Outcome: Progressing   Problem: Activity: Goal: Risk for activity intolerance will decrease Outcome: Progressing   Problem: Nutrition: Goal: Adequate nutrition will be maintained Outcome: Progressing   Problem: Pain Managment: Goal: General experience of comfort will improve and/or be controlled Outcome: Progressing   Problem: Safety: Goal: Ability to remain free from injury will improve Outcome: Progressing   Problem: Activity: Goal: Ability to tolerate increased activity will improve Outcome: Progressing   Problem: Respiratory: Goal: Ability to maintain a clear airway will improve Outcome: Progressing

## 2024-09-08 NOTE — Progress Notes (Signed)
 PT admitted to [facility] program. Pt transported via wheelchair fleeta by Nat, EMT. Pt able to ambulate into home without assistance.   Current health technology and remote monitoring set up and explained to Pt and his wife.   Home safety check performed. Pt lives in a single level town home with no steps leading to entrance. Pt's home is very clean, clutter free and all walk ways are clear and well lit. Pt will be spending majority of time in living room. Pt's bedroom is close to living room, approximately 15 feet. Pt has en-suite bathroom with walk in shower, shower chair and grab bars available. Pt's wife resides with Pt and states he sometimes get's winded when showering. I recommend Pt to use shower chair or take bird bath with washcloth at least for a few days while recovering. Pt is agreeable to same and will not try to get in the shower if he feels weak.   O2 concentrator set up in Pt's bedroom and attached to his cpap machine in the same manner his home concentrator was attached. Pt states he mainly uses O2 at bedtime or prn with exertion or exercise. Pt and wife shown how to use concentrator and given extra extension tubing, connectors and [place]. They state they are very familiar with same and feel comfortable using home O2 equipment. Neb machine set up in living room as well and they were shown how to use. They both state they are familiar and comfortable with using nebulizer machine.  Pt has home O2 concentrator, cpap and neb machine and will not need those on discharge.   Vital signs and assessment obtained as noted.  Medications administered as noted. Pt would like to refuse Lovenox  since he is moving around more at home and I notified Shanda, RN of same. Med rec completed as well and all meds sequestered in bag.  2 person skin check performed. Pt has some bruising from previous blood draws on left arm near [facility] area. Pt has a skin tear on right forearm from earlier when the nurse at the  hospital went to change the IV dressing. The RN covered the skin tear with dry gauze and a small amount of tape, tegaderm and coban. I removed all of that since Pt is very sensitive to tape. I placed xeroform over the skin tear, a small mepilex-like dressing the Pt already had at home over the xeroform, tegaderm over that and then wrapped with coban to secure. No tape was used. Photo of skin tear was uploaded to media tab via rover prior to redressing. IV care performed and curos cap added. No curos cap was present prior to transfer.   Pt and his wifen were shown how to receive and answer calls on tablet as well as how to request help. Welcome binder shown and Pt encouraged to call hub phone number with any questions or problems. Pt understands if he needs any medications including prn, he needs to call the nurse to inform them. Pt encouraged to call with any problems or concerns. I told Pt we will be back in the morning for his first routine visit but we have a Paramedic in the hub if he needs help tonight as well.   Pt and wife shown how to use

## 2024-09-09 LAB — COMPREHENSIVE METABOLIC PANEL WITH GFR
ALT: 33 U/L (ref 0–44)
AST: 22 U/L (ref 15–41)
Albumin: 3.4 g/dL — ABNORMAL LOW (ref 3.5–5.0)
Alkaline Phosphatase: 67 U/L (ref 38–126)
Anion gap: 12 (ref 5–15)
BUN: 32 mg/dL — ABNORMAL HIGH (ref 8–23)
CO2: 28 mmol/L (ref 22–32)
Calcium: 8.9 mg/dL (ref 8.9–10.3)
Chloride: 101 mmol/L (ref 98–111)
Creatinine, Ser: 1.29 mg/dL — ABNORMAL HIGH (ref 0.61–1.24)
GFR, Estimated: 56 mL/min — ABNORMAL LOW (ref >60)
Glucose, Bld: 143 mg/dL — ABNORMAL HIGH (ref 70–99)
Potassium: 4 mmol/L (ref 3.5–5.1)
Sodium: 141 mmol/L (ref 135–145)
Total Bilirubin: 0.6 mg/dL (ref 0.0–1.2)
Total Protein: 6.2 g/dL — ABNORMAL LOW (ref 6.5–8.1)

## 2024-09-09 LAB — CBC
HCT: 40 % (ref 39.0–52.0)
Hemoglobin: 12 g/dL — ABNORMAL LOW (ref 13.0–17.0)
MCH: 22.9 pg — ABNORMAL LOW (ref 26.0–34.0)
MCHC: 30 g/dL (ref 30.0–36.0)
MCV: 76.2 fL — ABNORMAL LOW (ref 80.0–100.0)
Platelets: 276 K/uL (ref 150–400)
RBC: 5.25 MIL/uL (ref 4.22–5.81)
RDW: 20.9 % — ABNORMAL HIGH (ref 11.5–15.5)
WBC: 13.6 K/uL — ABNORMAL HIGH (ref 4.0–10.5)
nRBC: 0 % (ref 0.0–0.2)

## 2024-09-09 MED ORDER — GUAIFENESIN-DM 100-10 MG/5ML PO SYRP
5.0000 mL | ORAL_SOLUTION | ORAL | Status: DC | PRN
  Administered 2024-09-09 – 2024-09-10 (×3): 5 mL via ORAL
  Filled 2024-09-09 (×4): qty 5

## 2024-09-09 NOTE — Plan of Care (Signed)
  Problem: Education: Goal: Knowledge of General Education information will improve Description: Including pain rating scale, medication(s)/side effects and non-pharmacologic comfort measures Outcome: Progressing   Problem: Health Behavior/Discharge Planning: Goal: Ability to manage health-related needs will improve Outcome: Progressing   Problem: Clinical Measurements: Goal: Ability to maintain clinical measurements within normal limits will improve Outcome: Progressing   Problem: Activity: Goal: Risk for activity intolerance will decrease Outcome: Progressing   Problem: Nutrition: Goal: Adequate nutrition will be maintained Outcome: Adequate for Discharge   Problem: Coping: Goal: Level of anxiety will decrease Outcome: Adequate for Discharge   Problem: Elimination: Goal: Will not experience complications related to bowel motility Outcome: Adequate for Discharge   Problem: Pain Managment: Goal: General experience of comfort will improve and/or be controlled Outcome: Adequate for Discharge   Problem: Safety: Goal: Ability to remain free from injury will improve Outcome: Progressing   Problem: Skin Integrity: Goal: Risk for impaired skin integrity will decrease Outcome: Progressing

## 2024-09-09 NOTE — Progress Notes (Signed)
 This Clinical research associate picked up labs from Paramedic, Lauraine and transported to [facility] for processing.-------------------------------------------------------------------------------------- Nat SQUIBB. Franchot, EMT

## 2024-09-09 NOTE — Progress Notes (Signed)
 I came back to PT's home to deliver new prn cough medication. Pt requested a dose now so I administered as noted.   As I was leaving the home, Pt's wife followed me outside to the car and states their son has hx of brain cancer and had a tumor removed and they found out today he may have a new issue related to same. Pt's wife told me she is very concerned because the last time this happened Pt ended up in the hospital with a worse COPD exacerbation. She asked us  to closely watch pt for any signs that he is getting worse. I told her we will monitor pt closely and offer any and all support to them we can. I also asked her to please not hesitate to call us  if she notices anything out of the ordinary or has any concerns at all. I told her we will be out later for second visit but can come out sooner if Pt needs us .

## 2024-09-09 NOTE — Plan of Care (Signed)
  Problem: Education: Goal: Knowledge of General Education information will improve Description: Including pain rating scale, medication(s)/side effects and non-pharmacologic comfort measures Outcome: Progressing   Problem: Health Behavior/Discharge Planning: Goal: Ability to manage health-related needs will improve Outcome: Progressing   Problem: Clinical Measurements: Goal: Ability to maintain clinical measurements within normal limits will improve Outcome: Progressing Goal: Diagnostic test results will improve Outcome: Progressing Goal: Respiratory complications will improve Outcome: Progressing Goal: Cardiovascular complication will be avoided Outcome: Progressing   Problem: Activity: Goal: Risk for activity intolerance will decrease Outcome: Progressing   Problem: Nutrition: Goal: Adequate nutrition will be maintained Outcome: Progressing   Problem: Coping: Goal: Level of anxiety will decrease Outcome: Progressing   Problem: Pain Managment: Goal: General experience of comfort will improve and/or be controlled Outcome: Progressing   Problem: Safety: Goal: Ability to remain free from injury will improve Outcome: Progressing   Problem: Nutritional: Goal: Maintenance of adequate nutrition will improve Outcome: Progressing   Problem: Activity: Goal: Ability to tolerate increased activity will improve Outcome: Progressing Goal: Will verbalize the importance of balancing activity with adequate rest periods Outcome: Progressing   Problem: Respiratory: Goal: Ability to maintain a clear airway will improve Outcome: Progressing

## 2024-09-09 NOTE — Progress Notes (Signed)
 Hospital at Home Daily Progress Note   Patient: Paul [name]  MRN: [id number]  DOB: 1946/10/30  DOA: 09/06/2024  DOS: the patient was seen and examined on 09/09/24    Patient identified themself as Paul [name]  DOB [birth year]  Medic Lauraine Faes present in the home during encounter and performed the physical exam. Patient was seen today via video chat; my physical location [place], [place]   Brief hospital course:  NAYSON TRAWEEK is a 78 y.o. 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  Patient states over the last 3 days patient has been getting more short of breath than usual. Baseline history of COPD requiring home O2 and nucala  injections. + home inhaler use with minimal improvement in symptoms. No reported tobacco use.  Patient admitted for COPD exacerbation.  Started on IV methylprednisolone , nebulizer therapy, and Abx.   9/17 -- The hospital at home program was discussed with the patient as well as wife yesterday with Dr. [name]. On discussion today, the patient is agreeable to going into the program today. Consent signed.   9/18 -- Pt improving, but more dyspneic than at baseline, had O2 desat to 78% with activity he had to bend over multiple times, recovered to 93% in couple minutes.     Assessment and Plan:  Acute COPD Exacerbation with Chronic Respiratory Failure w/ Hypoxia on 2 Liters: Still with mild wheezing.  Continue with scheduled and prn duonebs as well as breztri   On oral doxy- continue  Noted to be on prednisone  chronically (10mg  daily) Continue IV steroids with Solu-medrol  80 mg daily until further improvement before de-escalating  Will need prolonged prednisone  taper Baseline pt reports uses 2 L Prn and at night  - stable now, was on 2-2.5 L  continues on hospital Will continue to monitor  Respiratory Status carefully.  Continue Flutter Valve, Incentive Spirometry, and Guaifenesin  1200 mg po BID.  Monitor WBC w/ high dose steroid use  Monitor O2 sats closely & check ambulatory sats daily   Chronic HFpEF: Compensated and appears Euvolemic. Last EF measured was 65 to 70% with G1DD on February 2024.  Continue p.o Furosemide  40 mg po Daily and Dapaglilozin 10 mg po Daily.  Daily weights     History of Paroxysmal Atrial Fibrillation: S/p Watchman device placement. C/w Digoxin  125 mcg po Daily. Digoxin  Level was 1.0 Rate stable at present    Essential Hypertension, uncontrolled: SBP 130s-150s  Cont home norvasc   Follow    Diabetes Mellitus Type 2: Last hemoglobin A1c was 6.3 ~ 2 months ago.  Continue Farxiga  10 mg po Daily.  On BID blood sugar checks  Monitor sugars with steroid use, add insulin  coverage if needed   Leukocytosis: WBC 11.6-->8.7-->14.8 --> 13.6 today Suspect uptrend secondary to high dose IV steroid use  No overt signs of infection present  Monitor CBC     Lactic Acidosis: Resolved    Hypothyroidism: TSH within normal Levothyroxine  25 mcg po Daily    Obstructive Sleep Apnea:  CPAP at bedtime.   HLD:  Continue atorvastatin  40 mg po qHS   Microcytic Anemia: Hbg stable at 12.0 Continue Ferrous Sulfate  325 mg po Daily.      GERD/GI Prophylaxis:  Continue PPI w/ Pantoprazole  40 mg po BID  Overweight: Complicates overall prognosis and  care. Estimated body mass index is 26.01 kg/m as calculated from the following:   Height as of this encounter: 5' 5 (1.651 m).   Weight as of this encounter: 70.9 kg. Weight Loss and Dietary Counseling given     Patient Active Problem List   Diagnosis Date Noted   COPD GOLD  III      Priority: High   Paroxysmal atrial fibrillation (HCC) 08/02/2021    Priority: Low   Hypertension associated with diabetes (HCC) 01/28/2020    Priority: Low   DM type 2 with  diabetic mixed hyperlipidemia (HCC) 01/25/2020    Priority: Low   COPD exacerbation (HCC) 09/06/2024   COPD (chronic obstructive pulmonary disease) (HCC) 09/02/2024   COPD with acute exacerbation (HCC) 08/30/2024   Presence of heart assist device (HCC) 07/08/2024   Abnormal TSH 04/05/2024   Encounter for Medicare annual wellness exam 04/04/2024   Syncope and collapse 08/29/2023   Laceration of left thigh 08/29/2023   Age-related osteoporosis without current pathological fracture 07/26/2023   Secondary hypercoagulable state (HCC) 04/02/2023   OSA (obstructive sleep apnea) 03/26/2023   Hypoxia 02/04/2023   Elevated d-dimer 01/25/2023   Iron 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 08/16/2022   Olecranon bursitis of right elbow 05/16/2022   Syncope 04/08/2022   GERD (gastroesophageal reflux disease) / GI protection on antiplatelet therapy 12/27/2021   Lumbar back pain 11/17/2021   Oxygen  dependent 10/09/2021   Chronic respiratory failure with hypoxia (HCC) 01/16/2021   GAD (generalized anxiety disorder) 01/14/2021   Chronic diastolic CHF (congestive heart failure) (HCC) 04/01/2020   Age-related osteoporosis with healing pathological fracture, chronic back pain 02/06/2020   Chronic bilateral thoracic back pain 01/25/2020   Absolute anemia 05/29/2019   Pulmonary nodules 05/29/2019   Coronary artery calcification 04/06/2019   Atherosclerosis of aorta (HCC) 04/06/2019   Former cigarette smoker 12/05/2015           Subjective: Pt seen virtually during Am Medic visit this AM.  He reports feeling better, but not near his baselines yet in regards to dyspnea.  Denies cough, but did have a spell of coughing during our encounter.  Denies feeling tight and wheezy overnight or this AM.  Reports he had to move some things around, that after bending over several times his O2 dropped to 78%, improved to 93% within about 2 minutes.  Dyspnea with exertion still worse than his  usual baseline. No fever chills.   Physical Exam:    09/09/2024    9:00 AM 09/08/2024    4:00 PM 09/08/2024    2:33 PM  Vitals with BMI  Weight 156 lbs 14 oz 158 lbs 5 oz   BMI 26.11 26.34   Systolic 145 163 862  Diastolic 65 68 58  Pulse 85 76 75     General exam: awake, alert, no acute distress Respiratory system: generally diminished with left upper wheeze noted on inspiration, normal respiratory effort at rest. Cardiovascular system: normal S1/S2, RRR, no pedal edema.   Gastrointestinal system: soft, NT, ND Central nervous system: A&O x3. no gross focal neurologic deficits, normal speech Extremities: moves all, no edema, normal tone Psychiatry: normal mood, congruent affect, judgement and insight appear normal    Data Reviewed:  Notable labs --  Glucose 143, BUN 32 Cr 1.29 from 1.06 Albumin  3.4 WBC improved to 13.6 Hbg stable 12.0  Respiratory viral panel  negative  Family Communication: Wife was present in the home during the call.   Disposition: Status  is: Inpatient Remains inpatient appropriate because: remains on IV steroids pending further clinical improvement   Planned Discharge Destination: Home    Time spent: 45 minutes  Author: Burnard DELENA Cunning, DO [facility]  08/12/2024 7:00 AM  For on call review [email or web address].

## 2024-09-09 NOTE — Progress Notes (Addendum)
 Pt seen for routine [facility] morning visit. Pt appears well and states he slept fairly well but did get a call due to armband not picking up data in the night. Pt reports approximately 10 minutes ago he was trying to put away some items from the hospital and his spo2 dropped to 78% on RA, pt states he sat down and recovered back up to 93% within 2 minutes without any supplemental O2. Pt denies SOB. Pt's wife present and states she felt like Pt looked like he was breathing harder than normal during the event.   Vital signs and assessment obtained.   Upon auscultation, Pt found to have inspiratory wheezes in left upper field, diminished and clear in all other fields.   Medications administered as noted. IV care completed including new curos cap. Pt does have some drainage to bandage under tegaderm where he has skin tear. I did not remove and replace dressing to not disturb site. Will change either this evening or tomorrow.  Pt had virtual visit with RN and Dr. Fausto.   Labs drawn and taken by [facility] EMT.   I told pt we will be back later for afternoon visit and to deliver new prn cough medication.  Pt and wife encouraged to call back with any problems or questions.

## 2024-09-09 NOTE — Progress Notes (Signed)
 Video call completed with patient. Patient appears stable and denies any pain or discomfort tonight. All bedtime medications were administered as well as a prn dose of cough syrup. Patient plans to wear CPAP when he goes to bed tonight around 22:15. Patient encouraged to reach out to RN via phone or tablet if any issues arise overnight. Patient and his wife both agreed to do so.

## 2024-09-09 NOTE — Progress Notes (Signed)
 Pt called earlier on the phone by RN. Discussed lab work and encouraged patient to drink more water this evening and we would recheck labs in am. Pt states he feels good and has had a good day. Informed of paramedic visit at 530 pm

## 2024-09-09 NOTE — Progress Notes (Signed)
 Completed virtual rounds with MD,paramedic at patient bedside. POC reviewed and discussed ,patient voices understanding and agreement. Pt reminded to call RN for any needs, RN and MD available at all times. Pt voices understanding. Pt aware of next planned visit and next call from RN.

## 2024-09-09 NOTE — Progress Notes (Signed)
 During the pt's second visit today he was found sitting in his kitchen. He was alert and oriented to person,place,time and event. His skin was warm,dry and normal in color. He had a strong radial pulse and was breathing normally. The pt stated he was having a good day so far and denied any pain at this time.  The pt's skin tear was cleaned and the banding was changed.Vitals were taken and are as noted. The on call nurse was informed that the pt's blood pressure did read a little higher than previous and she stated she would make a note about same. Medications were administered as prescribed. The pt was asked if there was anything else that could be done for him and he denied same. The pt and his wife were told to call back if anything were to change and they agreed to same.

## 2024-09-10 LAB — CBC
HCT: 42.1 % (ref 39.0–52.0)
Hemoglobin: 12.4 g/dL — ABNORMAL LOW (ref 13.0–17.0)
MCH: 22.7 pg — ABNORMAL LOW (ref 26.0–34.0)
MCHC: 29.5 g/dL — ABNORMAL LOW (ref 30.0–36.0)
MCV: 77 fL — ABNORMAL LOW (ref 80.0–100.0)
Platelets: 316 K/uL (ref 150–400)
RBC: 5.47 MIL/uL (ref 4.22–5.81)
RDW: 21 % — ABNORMAL HIGH (ref 11.5–15.5)
WBC: 14.6 K/uL — ABNORMAL HIGH (ref 4.0–10.5)
nRBC: 0 % (ref 0.0–0.2)

## 2024-09-10 LAB — BASIC METABOLIC PANEL WITH GFR
Anion gap: 8 (ref 5–15)
BUN: 29 mg/dL — ABNORMAL HIGH (ref 8–23)
CO2: 29 mmol/L (ref 22–32)
Calcium: 8.8 mg/dL — ABNORMAL LOW (ref 8.9–10.3)
Chloride: 100 mmol/L (ref 98–111)
Creatinine, Ser: 1 mg/dL (ref 0.61–1.24)
GFR, Estimated: >60 mL/min (ref >60)
Glucose, Bld: 121 mg/dL — ABNORMAL HIGH (ref 70–99)
Potassium: 3.5 mmol/L (ref 3.5–5.1)
Sodium: 137 mmol/L (ref 135–145)

## 2024-09-10 MED ORDER — FLUTICASONE PROPIONATE 50 MCG/ACT NA SUSP
2.0000 | Freq: Every day | NASAL | Status: DC | PRN
  Filled 2024-09-10: qty 16

## 2024-09-10 MED ORDER — HYDRALAZINE HCL 25 MG PO TABS
25.0000 mg | ORAL_TABLET | Freq: Three times a day (TID) | ORAL | Status: DC | PRN
  Administered 2024-09-10: 25 mg via ORAL
  Filled 2024-09-10 (×2): qty 1

## 2024-09-10 MED ORDER — PREDNISONE 50 MG PO TABS
50.0000 mg | ORAL_TABLET | Freq: Every day | ORAL | Status: DC
  Administered 2024-09-11: 50 mg via ORAL
  Filled 2024-09-10 (×2): qty 1

## 2024-09-10 MED ORDER — LORATADINE 10 MG PO TABS
10.0000 mg | ORAL_TABLET | Freq: Every day | ORAL | Status: DC
  Administered 2024-09-10 – 2024-09-11 (×2): 10 mg via ORAL
  Filled 2024-09-10 (×3): qty 1

## 2024-09-10 MED ORDER — AMLODIPINE BESYLATE 5 MG PO TABS
5.0000 mg | ORAL_TABLET | Freq: Every day | ORAL | Status: DC
  Administered 2024-09-10 – 2024-09-11 (×2): 5 mg via ORAL
  Filled 2024-09-10 (×3): qty 1

## 2024-09-10 NOTE — Progress Notes (Signed)
 Camera in with patient and passed Night time meds early per patient request. All meds scanned/entered into MAR.

## 2024-09-10 NOTE — Plan of Care (Signed)

## 2024-09-10 NOTE — Progress Notes (Signed)
 Pt seen for routine [facility] evening visit. Pt appears well and states he feels fine and has no complaints. Pt has been using I/S and flutter valve and has increased his fluid intake today. Pt states he drank about 40 oz total throughout the day up to this point.   Vital signs and assessment obtained as noted. CBG was 200, RN Angela notified. Lung sounds still clear but diminished in all fields.   Medications administered as noted.   Pt's IV site is leaking so I removed tegaderm, dried leakage and redressed. I left bandage over skin tear below the IV site intact. The bandage over skin tear will need to be redressed tomorrow except for the layer of mepitel which can remain in place for 6 more days. Tegaderm and IV pigtail secured with coban, no tape was used.   Pt encouraged to call with any problems or questions throughout the night. Pt will call overnight RN when he is ready for bedtime medications.

## 2024-09-10 NOTE — Progress Notes (Signed)
 Hospital at Home Daily Progress Note   Patient: Paul [name]  MRN: [id number]  DOB: January 12, 1946  DOA: 09/06/2024  DOS: the patient was seen and examined on 09/10/24    Patient identified themself as Paul [name]  DOB January 08, 1946  Medic Lauraine Faes present in the home during encounter and performed the physical exam. Patient was seen today via video chat; my physical location [place], [place]   Brief hospital course:  AHMET SCHANK is a 78 y.o. 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  Patient states over the last 3 days patient has been getting more short of breath than usual. Baseline history of COPD requiring home O2 and nucala  injections. + home inhaler use with minimal improvement in symptoms. No reported tobacco use.  Patient admitted for COPD exacerbation.  Started on IV methylprednisolone , nebulizer therapy, and Abx.   9/17 -- The hospital at home program was discussed with the patient as well as wife yesterday with Dr. [name]. On discussion today, the patient is agreeable to going into the program today. Consent signed.   9/18 -- Pt improving, but more dyspneic than at baseline, had O2 desat to 78% with activity he had to bend over multiple times, recovered to 93% in couple minutes.    9/19 -- pt continues to improve, feels about 80-85% back to baseline dyspnea.  Will transition to prednisone  tomorrow.   Assessment and Plan:  Acute COPD Exacerbation  Chronic Respiratory Failure w/ Hypoxia Baseline pt reports uses 2 L Prn and at night  - stable now, was on 2-2.5 L continues on hospital Improved on high dose IV steroids & mgmt below. --Transition IV Solu-medrol  to Prednisone  50 mg daily tomorrow --Will send on prolonged taper back to baseline 10 mg (baseline  chronic dose) --Continue scheduled and PRN duonebs, Breztri   --Continue oral doxycycline  --Monitor respiratory status & O2 sats closely --Flutter Valve, Incentive Spirometry --Guaifenesin  1200 mg po BID   Chronic HFpEF: Stable, compensated, clinically appears euvolemic. Last EF was 65 to 70% with G1DD (February 2024)  --Continue p.o Furosemide  40 mg po Daily  --Dapaglilozin 10 mg po Daily --Daily weights   History of Paroxysmal Atrial Fibrillation: S/p Watchman device placement.  --Continue Digoxin  125 mcg po Daily Digoxin  Level was 1.0 HR's have been controlled.  Monitor.   Essential Hypertension, uncontrolled: SBP 130s-150s  Note that amlodipine  on pt's home med list, but he and wife confirm he's not been on that medication previously. BP's elevated this admission, high dose steroids may contribute --Start amlodipine  5 mg daily --Monitor BP's --Recommend home BP checks 2-3 times daily until follow up with PCP or Cardiology  --PRN oral hydralazine  if SBP still > 160 despite amlodipine  --Titrate regimen   Diabetes Mellitus Type 2: Last hemoglobin A1c was 6.3 ~ 2 months ago.  CBG's are within inpatient goal 140-180 --Continue Farxiga  10 mg po Daily.  --On BID blood sugar checks  --Monitor sugars with steroid use, add insulin  coverage if needed   Leukocytosis: steroid-induced WBC 11.6-->8.7-->14.8 --> 13.6 --> 14.6 todaytoday Suspect uptrend secondary to high dose IV steroid use  No overt signs of infection present  Monitor CBC & repeat in 1-2 weeks at follow up    Lactic Acidosis: Resolved    Hypothyroidism: TSH within normal --Levothyroxine  25 mcg  po Daily    Obstructive Sleep Apnea:  --CPAP at bedtime.   HLD:  --Continue atorvastatin  40 mg po qHS   Microcytic Anemia: Hbg stable at 12.0 -Continue Ferrous Sulfate  325 mg po Daily.      GERD/GI Prophylaxis: --Continue PPI w/ Pantoprazole  40 mg po BID  Overweight: Complicates overall prognosis and care. Estimated  body mass index is 26.01 kg/m as calculated from the following:   Height as of this encounter: 5' 5 (1.651 m).   Weight as of this encounter: 70.9 kg. Weight Loss and Dietary Counseling given     Patient Active Problem List   Diagnosis Date Noted   COPD GOLD  III      Priority: High   Paroxysmal atrial fibrillation (HCC) 08/02/2021    Priority: Low   Hypertension associated with diabetes (HCC) 01/28/2020    Priority: Low   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HCC) 01/25/2020    Priority: Low   COPD exacerbation (HCC) 09/06/2024   COPD (chronic obstructive pulmonary disease) (HCC) 09/02/2024   COPD with acute exacerbation (HCC) 08/30/2024   Presence of heart assist device (HCC) 07/08/2024   Abnormal TSH 04/05/2024   Encounter for Medicare annual wellness exam 04/04/2024   Syncope and collapse 08/29/2023   Laceration of left thigh 08/29/2023   Age-related osteoporosis without current pathological fracture 07/26/2023   Secondary hypercoagulable state (HCC) 04/02/2023   OSA (obstructive sleep apnea) 03/26/2023   Hypoxia 02/04/2023   Elevated d-dimer 01/25/2023   Iron 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 08/16/2022   Olecranon bursitis of right elbow 05/16/2022   Syncope 04/08/2022   GERD (gastroesophageal reflux disease) / GI protection on antiplatelet therapy 12/27/2021   Lumbar back pain 11/17/2021   Oxygen  dependent 10/09/2021   Chronic respiratory failure with hypoxia (HCC) 01/16/2021   GAD (generalized anxiety disorder) 01/14/2021   Chronic diastolic CHF (congestive heart failure) (HCC) 04/01/2020   Age-related osteoporosis with healing pathological fracture, chronic back pain 02/06/2020   Chronic bilateral thoracic back pain 01/25/2020   Absolute anemia 05/29/2019   Pulmonary nodules 05/29/2019   Coronary artery calcification 04/06/2019   Atherosclerosis of aorta (HCC) 04/06/2019   Former cigarette smoker 12/05/2015            Subjective: Pt seen virtually during Am Medic visit this AM.  He reports he continues to feel improving, feels his dyspnea is about 85% back to his baseline.  Denies significant wheezing or cough.  No fever/chills or other acute complaints today.    Physical Exam:    09/10/2024    1:55 PM 09/10/2024   11:00 AM 09/09/2024    6:00 PM  Vitals with BMI  Systolic 169 172 831  Diastolic 54 70 94  Pulse 80 81 95     General exam: awake, alert, no acute distress Respiratory system: lungs overall clear & diminished throughout, no expiratory wheezes Cardiovascular system: normal S1/S2, RRR, no pedal edema.   Gastrointestinal system: soft, NT, ND Central nervous system: A&O x3. no gross focal neurologic deficits, normal speech Extremities: moves all, no edema, normal tone Psychiatry: normal mood, congruent affect, judgement and insight appear normal    Data Reviewed:  Notable labs --  BMP normal except glucose 121, BUN 29, Ca 8.8 Note Cr improved 1.29 >> 1.00 with oral hydration   Respiratory viral panel was negative  Family Communication: Wife was present in the home during the call.   Disposition: Status is: Inpatient Remains inpatient appropriate because: remains  on IV steroids pending further clinical improvement   Planned Discharge Destination: Home    Time spent: 40 minutes  Author: Burnard DELENA Cunning, DO [facility]  08/12/2024 7:00 AM  For on call review [email or web address].

## 2024-09-10 NOTE — Progress Notes (Signed)
 Follow up video call for blood pressure. Blood pressure 169/54, pt and his wife applied the automatic cuff. Pt directed and supervised to give prn 25 mg po of hydralazine . Pt denies any headaches or blurry vision. Pt aware of visit scheduled this afternoon with paramedic.

## 2024-09-10 NOTE — Progress Notes (Signed)
 Completed virtual rounds with MD,paramedic at patient bedside. POC reviewed and discussed ,patient voices understanding and agreement. Pt reminded to call RN for any needs, RN and MD available at all times. Pt voices understanding. Pt aware of next planned visit and next call from RN.

## 2024-09-10 NOTE — Progress Notes (Signed)
 Pt seen for routine morning [facility] visit. Pt appears well and states he slept fairly well. Pt denies any episodes of his spO2 dropping this morning. Pt denies any pain or any complaints.   Vital signs and assessment obtained as noted. Pt's lungs are clear but diminished in all fields.   Medications administered as noted. IV care performed including new curos cap. Labs drawn. CBG was 154.   Pt had virtual visit with RN and Dr. Fausto.  IMM letter signed and yellow copy left with pt, other copy placed in shadow chart.   Pt encouraged to call back with any problems or questions.

## 2024-09-10 NOTE — Progress Notes (Signed)
 Pt called in to nurse for am meds. RN supervised Breztri , synthroid  and duoneb. Pt 's voice sounds strong and clear and states he feels much better. Discussed treatment changes today, and of paramedic visit.

## 2024-09-11 ENCOUNTER — Other Ambulatory Visit (HOSPITAL_COMMUNITY): Payer: Self-pay

## 2024-09-11 ENCOUNTER — Encounter: Payer: Self-pay | Admitting: Internal Medicine

## 2024-09-11 MED ORDER — FUROSEMIDE 40 MG PO TABS
40.0000 mg | ORAL_TABLET | Freq: Every day | ORAL | 1 refills | Status: DC
  Filled 2024-09-11: qty 30, 30d supply, fill #0

## 2024-09-11 MED ORDER — AMLODIPINE BESYLATE 5 MG PO TABS
5.0000 mg | ORAL_TABLET | Freq: Every day | ORAL | 1 refills | Status: AC
  Filled 2024-09-11: qty 30, 30d supply, fill #0

## 2024-09-11 MED ORDER — IPRATROPIUM-ALBUTEROL 0.5-2.5 (3) MG/3ML IN SOLN
3.0000 mL | Freq: Two times a day (BID) | RESPIRATORY_TRACT | 0 refills | Status: DC
  Filled 2024-09-11: qty 90, 15d supply, fill #0

## 2024-09-11 MED ORDER — PREDNISONE 20 MG PO TABS
ORAL_TABLET | ORAL | 0 refills | Status: AC
Start: 2024-09-11 — End: 2024-09-21
  Filled 2024-09-11: qty 17, 10d supply, fill #0

## 2024-09-11 NOTE — Progress Notes (Signed)
 Spoke with patient via video chat and supervised adm.of morning meds. Pt has no complaints, states he feels very good. Explained delay of paramedic visit and EMT en route.

## 2024-09-11 NOTE — Progress Notes (Signed)
 This Clinical research associate arrived at patients home to find him sitting at his bar in the kitchen. This Clinical research associate asked patient how he was feeling today, he said great, he felt much better. This Clinical research associate called the virtual RN, Jon to get guidance on where to put patients medication and asked what needed to be done? This writer placed meds in afternoon/night time bin and took patients vital signs. Patients vital signs were as follows: BP-147/53, P-83, R-20, T-97.7, O2 sat-94% on room air. The patient stated his tubing broke on his nebulizer, this writer went out to the vehicle and replaced the broken one. The team Paramedics, Vikki and Steffan arrived to do morning visit and this Clinical research associate left. -------------------------------------------- Nat SQUIBB. Franchot, EMT

## 2024-09-11 NOTE — TOC Transition Note (Signed)
 Transition of Care [facility]) - Discharge Note   Patient Details  Name: Paul [name] MRN: [id number] Date of Birth: 07/09/1946  [facility] CM/SW Contact:  Corean JAYSON Canary, RN Phone Number: 09/11/2024, 9:35 AM   Clinical Narrative:    [name] will likely transition and discharge today He has a nebulizer at home confirmed by [facility]  staff. PT assessment indicated no needs for therapy.  IP care management signing off, if a new need is identified please place a TOC consult.   Final next level of care: Home/Self Care Barriers to Discharge: No Barriers Identified   Patient Goals and CMS Choice            Discharge Placement                       Discharge Plan and Services Additional resources added to the After Visit Summary for     Discharge Planning Services: CM Consult                                 Social Drivers of Health (SDOH) Interventions SDOH Screenings   Food Insecurity: No Food Insecurity (09/06/2024)  Housing: Low Risk  (09/06/2024)  Transportation Needs: No Transportation Needs (09/06/2024)  Utilities: Not At Risk (09/06/2024)  Alcohol Screen: Low Risk  (07/26/2024)  Depression (PHQ2-9): Low Risk  (07/26/2024)  Financial Resource Strain: Low Risk  (07/19/2023)  Physical Activity: Insufficiently Active (07/19/2023)  Social Connections: Socially Integrated (09/06/2024)  Stress: No Stress Concern Present (07/19/2023)  Tobacco Use: Medium Risk (09/06/2024)  Health Literacy: Adequate Health Literacy (07/23/2023)     Readmission Risk Interventions    09/07/2024    3:38 PM 02/18/2023    3:55 PM 01/27/2023    3:27 PM  Readmission Risk Prevention Plan  Transportation Screening Complete Complete Complete  PCP or Specialist Appt within 5-7 Days Complete    Home Care Screening Complete    Medication Review (RN CM) Complete    HRI or Home Care Consult   Complete  Social Work Consult for Recovery Care Planning/Counseling   Complete   Palliative Care Screening   Not Applicable  Medication Review Oceanographer)  Referral to Pharmacy Referral to Pharmacy  HRI or Home Care Consult  Complete   SW Recovery Care/Counseling Consult  Complete   Palliative Care Screening  Complete   Skilled Nursing Facility  Not Applicable

## 2024-09-11 NOTE — Discharge Summary (Signed)
 [facility] Discharge Summary   Patient: Paul [name] MRN: [id number] DOB: May 22, 1946  Admit date:     09/06/2024  Discharge date: 09/11/2024  Discharge Physician: Burnard DELENA Cunning   PCP: [name], MD   Recommendations at discharge:   Follow up with Pulmonology Follow up with Cardiology Follow up with Primary Care  Repeat CBC, CMP at follow up  Follow up on BP control and adjust regimen as needed   Discharge Diagnoses: Active Problems:   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HCC)   Hypertension associated with diabetes (HCC)   Paroxysmal atrial fibrillation (HCC)   Chronic diastolic CHF (congestive heart failure) (HCC)   Chronic respiratory failure with hypoxia (HCC)   GERD (gastroesophageal reflux disease) / GI protection on antiplatelet therapy   OSA (obstructive sleep apnea)  Principal Problem (Resolved):   COPD exacerbation (HCC)   Patient identified themself as Paul [name]  DOB 06-14-1946  Medic Paul [name] present in the home during encounter and performed the physical exam. Patient was seen today via video chat; my physical location [place], [place]    Hospital Course:  Paul [name] is a 78 y.o. 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  Patient states over the last 3 days patient has been getting more short of breath than usual. Baseline history of COPD requiring home O2 and nucala  injections. + home inhaler use with minimal improvement in symptoms. No reported tobacco use.  Patient admitted for COPD exacerbation.  Started on IV methylprednisolone , nebulizer therapy, and Abx.   9/17 -- The hospital at home program was discussed with the patient as well as wife yesterday with Dr. [name]. On discussion today, the patient is  agreeable to going into the program today. Consent signed.    9/18 -- Pt improving, but more dyspneic than at baseline, had O2 desat to 78% with activity he had to bend over multiple times, recovered to 93% in couple minutes.     9/19 -- pt continues to improve, feels about 80-85% back to baseline dyspnea.  Will transition to prednisone  tomorrow.  9/20 -- pt reports further improvement in his breathing today.  Nearly back to baseline.  No other issues or complaints.  Pt is medically stable for d/c from [facility] program today.   Assessment and Plan:  Acute COPD Exacerbation  Chronic Respiratory Failure w/ Hypoxia Baseline pt reports uses 2 L Prn and at night  - stable now, was on 2-2.5 L continues on hospital Improved on high dose IV steroids & mgmt below. --Treated with hig hdose IV Solu-medrol   --Transitioned to Prednisone  50 mg & d/c on prolonged taper --scheduled and PRN duonebs, Breztri   --Completed course of oral doxycycline  --Flutter Valve, Incentive Spirometry --Guaifenesin  1200 mg po BID --Close Pulmonology follow up recommended   Chronic HFpEF: Stable, compensated, clinically appears euvolemic. Last EF was 65 to 70% with G1DD (February 2024)  --Continue p.o Furosemide  40 mg po Daily  --Dapaglilozin 10 mg po Daily --Daily weights   History of Paroxysmal Atrial Fibrillation: S/p Watchman device placement.  --Continue Digoxin  125 mcg po Daily Digoxin  Level was 1.0 HR's have been controlled.  Monitor.   Essential Hypertension, uncontrolled: SBP 130s-150s  Note that amlodipine  on pt's home med list, but he and wife confirm he's  not been on that medication previously. BP's elevated this admission, high dose steroids may contribute --Started amlodipine  5 mg daily --Continue Lasix  40 mg --Recommend home BP checks 2-3 times daily until follow up with PCP or Cardiology     Diabetes Mellitus Type 2: Last hemoglobin A1c was 6.3 ~ 2 months ago.  CBG's are within  inpatient goal 140-180 --Continue Farxiga  10 mg po Daily.  --Monitor sugars with steroid use -- pt did not require insulin  despite steroids --PCP ollow up   Leukocytosis: steroid-induced WBC 11.6-->8.7-->14.8 --> 13.6 --> 14.6 todaytoday Suspect uptrend secondary to high dose IV steroid use  No overt signs of infection present  Monitor CBC & repeat in 1-2 weeks at follow up    Lactic Acidosis: Resolved    Hypothyroidism: TSH within normal --Levothyroxine  25 mcg po Daily    Obstructive Sleep Apnea:  --CPAP at bedtime.   HLD:  --Continue atorvastatin  40 mg po qHS   Microcytic Anemia: Hbg stable at 12.0 -Continue Ferrous Sulfate  325 mg po Daily.      GERD/GI Prophylaxis: --Continue PPI w/ Pantoprazole  40 mg po BID  Overweight: Complicates overall prognosis and care. Estimated body mass index is 26.01 kg/m as calculated from the following:   Height as of this encounter: 5' 5 (1.651 m).   Weight as of this encounter: 70.9 kg. Weight Loss and Dietary Counseling given       Consultants: none Procedures performed: none  Disposition: Home Diet recommendation:  Discharge Diet Orders (From admission, onward)     Start     Ordered   09/11/24 0000  Diet - low sodium heart healthy        09/11/24 1245            DISCHARGE MEDICATION: Allergies as of 09/11/2024       Reactions   Tape Other (See Comments)   PLEASE USE COBAN WRAP IN LIEU OF TAPE, as it pulls off the skin!!   Daliresp  [roflumilast ] Diarrhea, Nausea And Vomiting   Levaquin  [levofloxacin ] Palpitations, Other (See Comments)   Made the B/P fluctuate and heart raced   Alendronate Anxiety, Other (See Comments)   Jittery/nervous        Medication List     PAUSE taking these medications    predniSONE  10 MG tablet Wait to take this until your doctor or other care provider tells you to start again. Commonly known as: DELTASONE  TAKE 1 TABLET (10 MG TOTAL) BY MOUTH DAILY WITH BREAKFAST.        TAKE these medications    Accu-Chek Softclix Lancets lancets Check fasting sugar once daily.   acetaminophen  500 MG tablet Commonly known as: TYLENOL  Take 1,000 mg by mouth every 8 (eight) hours as needed for mild pain (pain score 1-3) (or headaches).   amLODipine  5 MG tablet Commonly known as: NORVASC  Take 1 tablet (5 mg total) by mouth daily. What changed:  medication strength how much to take   aspirin  EC 81 MG tablet Take 1 tablet (81 mg total) by mouth daily. Swallow whole.   atorvastatin  40 MG tablet Commonly known as: LIPITOR TAKE 1 TABLET BY MOUTH EVERYDAY AT BEDTIME What changed: See the new instructions.   blood glucose meter kit and supplies Kit Dispense based on patient and insurance preference. Use up to four times daily as directed.   Blood Glucose Monitoring Suppl Devi Use to test blood sugar up to twice daily. May substitute to any manufacturer covered by patient's insurance. E11.0   BLOOD  GLUCOSE TEST STRIPS Strp Use to test blood sugar up to twice daily. May substitute to any manufacturer covered by patient's insurance. E11.0   Breztri  Aerosphere 160-9-4.8 MCG/ACT Aero inhaler Generic drug: budesonide -glycopyrrolate -formoterol  Inhale 2 puffs into the lungs 2 (two) times daily.   dapagliflozin  propanediol 10 MG Tabs tablet Commonly known as: Farxiga  Take 1 tablet (10 mg total) by mouth daily.   denosumab  60 MG/ML Sosy injection Commonly known as: PROLIA  Inject 60 mg into the skin every 6 (six) months.   digoxin  0.125 MG tablet Commonly known as: LANOXIN  TAKE 1 TABLET BY MOUTH DAILY   ferrous sulfate  325 (65 FE) MG tablet Take 325 mg by mouth 2 (two) times daily with a meal. What changed: when to take this   fluticasone  50 MCG/ACT nasal spray Commonly known as: FLONASE  Place 2 sprays into both nostrils daily as needed for allergies or rhinitis.   furosemide  40 MG tablet Commonly known as: LASIX  Take 1 tablet (40 mg total) by mouth  daily. What changed:  how much to take how to take this when to take this additional instructions   ipratropium-albuterol  0.5-2.5 (3) MG/3ML Soln Commonly known as: DUONEB Take 3 mLs by nebulization 2 (two) times daily for 10 days.   Lancet Device Misc Use to test blood sugar up to twice daily. May substitute to any manufacturer covered by patient's insurance. E11.0   Lancets Misc. Misc Use to test blood sugar up to twice daily. May substitute to any manufacturer covered by patient's insurance. E11.0   levothyroxine  25 MCG tablet Commonly known as: SYNTHROID  Take 1 tablet (25 mcg total) by mouth daily. What changed: when to take this   loratadine  10 MG tablet Commonly known as: CLARITIN  Take 10 mg by mouth in the morning.   Melatonin 12 MG Tabs Take 12 mg by mouth at bedtime.   Mucinex  Maximum Strength 1200 MG Tb12 Generic drug: Guaifenesin  Take 1,200 mg by mouth in the morning and at bedtime.   multivitamin with minerals Tabs tablet Take 1 tablet by mouth daily with breakfast.   NON FORMULARY Take 1 capsule by mouth See admin instructions. Triple Strength Omega 3 Fish Oil 1200 mg  EPA & DHA capsules - Take 1 capsule by mouth at bedtime   Nucala  100 MG/ML Sosy Generic drug: mepolizumab  Inject 100 mg into the skin every 30 (thirty) days.   OXYGEN  Inhale 2-2.5 L/min into the lungs See admin instructions. 2-2.5 L/min at bedtime and as needed during the day and/or with  exercise/activity- for shortness of breath   pantoprazole  40 MG tablet Commonly known as: PROTONIX  Take 1 tablet (40 mg total) by mouth 2 (two) times daily. What changed: when to take this   ProAir  HFA 108 (90 Base) MCG/ACT inhaler Generic drug: albuterol  Inhale 2 puffs into the lungs every 6 (six) hours as needed for wheezing or shortness of breath.   albuterol  (2.5 MG/3ML) 0.083% nebulizer solution Commonly known as: PROVENTIL  Take 3 mLs (2.5 mg total) by nebulization every 6 (six) hours as  needed for shortness of breath or wheezing.   Probiotic Daily Caps Take 1 capsule by mouth in the morning.   sertraline  50 MG tablet Commonly known as: ZOLOFT  TAKE 1 TABLET BY MOUTH EVERYDAY AT BEDTIME What changed: See the new instructions.   vitamin C  1000 MG tablet Take 1,000 mg by mouth daily.   Vitamin D3 50 MCG (2000 UT) Tabs Take 2,000 Units by mouth in the morning.  ASK your doctor about these medications    predniSONE  20 MG tablet Commonly known as: DELTASONE  Take 2.5 tablets (50 mg total) by mouth daily with breakfast for 2 days, THEN 2 tablets (40 mg total) daily with breakfast for 3 days, THEN 1.5 tablets (30 mg total) daily with breakfast for 2 days, THEN 1 tablet (20 mg total) daily with breakfast for 3 days. Then resume usual 10 mg daily indefinitely. Start taking on: September 11, 2024 Ask about: Should I take this medication?        Follow-up Information     Cox, [name], MD Follow up.   Specialty: Family Medicine Contact information: [address] [place] [address] [phone]                Discharge Exam: Fredricka Weights   09/08/24 1600 09/09/24 0900 09/10/24 1600  Weight: 71.8 kg 71.2 kg 70.5 kg   General exam: awake, alert, no acute distress Respiratory system: CTAB, no wheezes, rales or rhonchi, normal respiratory effort. Cardiovascular system: normal S1/S2, RRR Gastrointestinal system: soft, NT, ND, no HSM felt, +bowel sounds. Central nervous system: A&O x3. no gross focal neurologic deficits, normal speech Extremities: moves all, no edema, normal tone Psychiatry: normal mood, congruent affect, judgement and insight appear normal   Condition at discharge: stable  The results of significant diagnostics from this hospitalization (including imaging, microbiology, ancillary and laboratory) are listed below for reference.   Imaging Studies: DG CHEST PORT 1 VIEW Result Date: 09/08/2024 CLINICAL DATA:  [id number].   Shortness of breath. EXAM: PORTABLE CHEST 1 VIEW COMPARISON:  Portable chest yesterday at 8:46 a.m. FINDINGS: 5:13 a.m. the cardiac size is normal. Left atrial appendage occlusion device is again noted. No vascular congestion is seen. The mediastinum is stable. There is calcification of the transverse aorta. Both lungs are emphysematous but clear. Thoracic spondylosis with no new osseous findings. IMPRESSION: No evidence of acute chest disease. Emphysema. Aortic atherosclerosis. Stable COPD chest. Electronically Signed   By: Francis Quam M.D.   On: 09/08/2024 07:55   DG CHEST PORT 1 VIEW Result Date: 09/07/2024 EXAM: 1 VIEW XRAY OF THE CHEST 09/07/2024 08:48:25 AM COMPARISON: 09/06/2024 CLINICAL HISTORY: SOB (shortness of breath) [id number]. SOB FINDINGS: LUNGS AND PLEURA: No focal pulmonary opacity. No pulmonary edema. No pleural effusion. No pneumothorax. 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 Aortic atherosclerosis. BONES AND SOFT TISSUES: No acute osseous abnormality. IMPRESSION: 1. No acute findings. Electronically signed by: Norman Gatlin MD 09/07/2024 10:33 AM EDT RP Workstation: [id number]   DG Chest Portable 1 View Result Date: 09/06/2024 CLINICAL DATA:  Hypoxia.  Shortness of breath. EXAM: PORTABLE CHEST 1 VIEW COMPARISON:  08/26/2024 FINDINGS: Heart size is normal. Evidence for a left atrial appendage occlusion device. Atherosclerotic calcifications at the aortic arch. Trachea is midline. Lungs are clear without focal airspace disease or pulmonary edema. Negative for a pneumothorax. No acute bone abnormality. IMPRESSION: No active disease. Electronically Signed   By: Juliene Balder M.D.   On: 09/06/2024 17:54   DG Chest 2 View Result Date: 08/26/2024 CLINICAL DATA:  Shortness of breath EXAM: CHEST - 2 VIEW COMPARISON:  05/29/2023 FINDINGS: Atherosclerotic calcification of the aortic arch. Thoracic spondylosis. Heart size within normal limits. Mild biapical  pleuroparenchymal scarring. The lungs appear otherwise clear. No blunting of the costophrenic angles. Compression deformities at T4, T5, T7, T8, T9, T10, T12, and L1 similar to the CT from 08/20/2023. IMPRESSION: 1. No acute cardiopulmonary disease is radiographically  apparent. 2. Multiple thoracic and lumbar compression fractures similar to the CT from 08/20/2023. 3. Thoracic spondylosis. Electronically Signed   By: Ryan Salvage M.D.   On: 08/26/2024 16:21    Microbiology: Results for orders placed or performed during the hospital encounter of 09/06/24  Resp panel by RT-PCR (RSV, Flu A&B, Covid) Anterior Nasal Swab     Status: None   Collection Time: 09/06/24  5:32 PM   Specimen: Anterior Nasal Swab  Result Value Ref Range Status   SARS Coronavirus 2 by RT PCR NEGATIVE NEGATIVE Final    Comment: (NOTE) SARS-CoV-2 target nucleic acids are NOT DETECTED.  The SARS-CoV-2 RNA is generally detectable in upper respiratory specimens during the acute phase of infection. The lowest concentration of SARS-CoV-2 viral copies this assay can detect is 138 copies/mL. A negative result does not preclude SARS-Cov-2 infection and should not be used as the sole basis for treatment or other patient management decisions. A negative result may occur with  improper specimen collection/handling, submission of specimen other than nasopharyngeal swab, presence of viral mutation(s) within the areas targeted by this assay, and inadequate number of viral copies(<138 copies/mL). A negative result must be combined with clinical observations, patient history, and epidemiological information. The expected result is Negative.  Fact Sheet for Patients:  BloggerCourse.com  Fact Sheet for Healthcare Providers:  SeriousBroker.it  This test is no t yet approved or cleared by the [place]  FDA and  has been authorized for detection and/or diagnosis of SARS-CoV-2  by FDA under an Emergency Use Authorization (EUA). This EUA will remain  in effect (meaning this test can be used) for the duration of the COVID-19 declaration under Section 564(b)(1) of the Act, 21 U.S.C.section 360bbb-3(b)(1), unless the authorization is terminated  or revoked sooner.       Influenza A by PCR NEGATIVE NEGATIVE Final   Influenza B by PCR NEGATIVE NEGATIVE Final    Comment: (NOTE) The Xpert Xpress SARS-CoV-2/FLU/RSV plus assay is intended as an aid in the diagnosis of influenza from Nasopharyngeal swab specimens and should not be used as a sole basis for treatment. Nasal washings and aspirates are unacceptable for Xpert Xpress SARS-CoV-2/FLU/RSV testing.  Fact Sheet for Patients: BloggerCourse.com  Fact Sheet for Healthcare Providers: SeriousBroker.it  This test is not yet approved or cleared by the [place]  FDA and has been authorized for detection and/or diagnosis of SARS-CoV-2 by FDA under an Emergency Use Authorization (EUA). This EUA will remain in effect (meaning this test can be used) for the duration of the COVID-19 declaration under Section 564(b)(1) of the Act, 21 U.S.C. section 360bbb-3(b)(1), unless the authorization is terminated or revoked.     Resp Syncytial Virus by PCR NEGATIVE NEGATIVE Final    Comment: (NOTE) Fact Sheet for Patients: BloggerCourse.com  Fact Sheet for Healthcare Providers: SeriousBroker.it  This test is not yet approved or cleared by the [place]  FDA and has been authorized for detection and/or diagnosis of SARS-CoV-2 by FDA under an Emergency Use Authorization (EUA). This EUA will remain in effect (meaning this test can be used) for the duration of the COVID-19 declaration under Section 564(b)(1) of the Act, 21 U.S.C. section 360bbb-3(b)(1), unless the authorization is terminated or revoked.  Performed at [facility], [address] [place], [place] [address]   Respiratory (~20 pathogens) panel by PCR     Status: None   Collection Time: 09/08/24 10:39 AM   Specimen: Nasopharyngeal Swab; Respiratory  Result Value Ref Range  Status   Adenovirus NOT DETECTED NOT DETECTED Final   Coronavirus 229E NOT DETECTED NOT DETECTED Final    Comment: (NOTE) The Coronavirus on the Respiratory Panel, DOES NOT test for the novel  Coronavirus (2019 nCoV)    Coronavirus HKU1 NOT DETECTED NOT DETECTED Final   Coronavirus NL63 NOT DETECTED NOT DETECTED Final   Coronavirus OC43 NOT DETECTED NOT DETECTED Final   Metapneumovirus NOT DETECTED NOT DETECTED Final   Rhinovirus / Enterovirus NOT DETECTED NOT DETECTED Final   Influenza A NOT DETECTED NOT DETECTED Final   Influenza B NOT DETECTED NOT DETECTED Final   Parainfluenza Virus 1 NOT DETECTED NOT DETECTED Final   Parainfluenza Virus 2 NOT DETECTED NOT DETECTED Final   Parainfluenza Virus 3 NOT DETECTED NOT DETECTED Final   Parainfluenza Virus 4 NOT DETECTED NOT DETECTED Final   Respiratory Syncytial Virus NOT DETECTED NOT DETECTED Final   Bordetella pertussis NOT DETECTED NOT DETECTED Final   Bordetella Parapertussis NOT DETECTED NOT DETECTED Final   Chlamydophila pneumoniae NOT DETECTED NOT DETECTED Final   Mycoplasma pneumoniae NOT DETECTED NOT DETECTED Final    Comment: Performed at [facility], [address], [place], [place] [address]    Labs: CBC: No results for input(s): WBC, NEUTROABS, HGB, HCT, MCV, PLT in the last 168 hours.  Basic Metabolic Panel: No results for input(s): NA, K, CL, CO2, GLUCOSE, BUN, CREATININE, CALCIUM , MG, PHOS in the last 168 hours.  Liver Function Tests: No results for input(s): AST, ALT, ALKPHOS, BILITOT, PROT, ALBUMIN  in the last 168 hours.  CBG: No results for input(s): GLUCAP in the last 168 hours.   Discharge time spent: less than 30  minutes.  Signed: Burnard DELENA Cunning, DO [facility] 09/22/2024

## 2024-09-11 NOTE — Progress Notes (Signed)
 Arrived to the PT's house to find him sitting upright in his kitchen. No obvious distress noted, PT is alert and oriented to his baseline. PT stated that he slept well last night and denied having any complaints. PT's wife is present at home.   PT's vitals were obtained by EMT Franchot and were documented accordingly. Physical exam showed negative DCAPBTLS to the head, neck, or face. PERRLA. Negative JVD or tracheal deviation. Chest expansion is equal with non-labored breathing. L/C were C/E in all fields. ABD is soft, non-tender. IV was d/c due to possible d/c.   Medications were administered by EMT Franchot with RN Jonne oversight.   DO Griffith visit was done. D/C orders were placed. PT denied having any other questions.

## 2024-09-11 NOTE — Progress Notes (Signed)
 Reviewed AVS with patient . Answered all questions. Pt and his wife voiced understanding .

## 2024-09-11 NOTE — Discharge Instructions (Signed)
 Wound Care:  The first layer is mepitel. This stays on your wound for 7 days. This helps protect the new skin growth during dressing change.  The second layer is a petroleum dressing that keeps the wound base moist to allow your WBC to heal your wound.change this every 2 days.  The third layer is a nonadherent dressing. Change this every 2 days. If the dressing has old drainage on it and is hard to pull off, wet it with saline so you don't tear the new skin growth.   Wrap the dressing with kling wrap or coban.   After 7 days ,remove the dressing and leave open to air or apply a bandaid. If the wound is not healing. Follow up with your primary

## 2024-09-13 ENCOUNTER — Telehealth: Payer: Self-pay

## 2024-09-13 NOTE — Transitions of Care (Post Inpatient/ED Visit) (Signed)
   09/13/2024  Name: Paul [name] MRN: [id number] DOB: [birth year]  Today's TOC FU Call Status: Today's TOC FU Call Status:: Successful TOC FU Call Completed TOC FU Call Complete Date: 09/13/24 (spoke with patient who declined TOC program/call - denies need) Patient's Name and Date of Birth confirmed.  Transition Care Management Follow-up Telephone Call Date of Discharge: 09/11/24 Discharge Facility: [facility]) Type of Discharge: Inpatient Admission Primary Inpatient Discharge Diagnosis:: COPD exacerbation  Shona Prow RN, CCM [place]  VBCI-Population Health RN Care Manager [phone]

## 2024-09-16 ENCOUNTER — Ambulatory Visit (INDEPENDENT_AMBULATORY_CARE_PROVIDER_SITE_OTHER): Admitting: Family Medicine

## 2024-09-16 ENCOUNTER — Encounter: Payer: Self-pay | Admitting: Family Medicine

## 2024-09-16 VITALS — BP 130/64 | HR 94 | Temp 97.9°F | Resp 20 | Ht 65.0 in | Wt 159.2 lb

## 2024-09-16 DIAGNOSIS — E1159 Type 2 diabetes mellitus with other circulatory complications: Secondary | ICD-10-CM

## 2024-09-16 DIAGNOSIS — I152 Hypertension secondary to endocrine disorders: Secondary | ICD-10-CM

## 2024-09-16 DIAGNOSIS — Z23 Encounter for immunization: Secondary | ICD-10-CM | POA: Diagnosis not present

## 2024-09-16 DIAGNOSIS — J449 Chronic obstructive pulmonary disease, unspecified: Secondary | ICD-10-CM | POA: Diagnosis not present

## 2024-09-16 MED ORDER — BREZTRI AEROSPHERE 160-9-4.8 MCG/ACT IN AERO: 2.0000 | INHALATION_SPRAY | Freq: Two times a day (BID) | RESPIRATORY_TRACT | Status: DC

## 2024-09-16 NOTE — Progress Notes (Signed)
 Subjective:  Patient ID: Paul [name], male    DOB: 11-05-1946  Age: 78 y.o. MRN: [id number]  Chief Complaint  Patient presents with   Hospitalization Follow-up   Discussed the use of AI scribe software for clinical note transcription with the patient, who gave verbal consent to proceed.  History of Present Illness   Paul [name] is a 78 year old male who presents for blood pressure management.  Hypertension - Blood pressure has been elevated, with readings of 169/54 on September 19 and reaching the 180s in August - Blood pressure was elevated prior to starting prednisone  and worsened while on a significant amount of prednisone  - Currently, blood pressure is 130/64 - Attributes some previous blood pressure elevations to anxiety - Currently taking antihypertensive medication - No headaches, blurred vision, dizziness, or lightheadedness - Denies chest pain  COPD - Oxygen  saturation has dropped to 70% at times, which is concerning to him and his daughter  - Requesting Breztri  sample - Denies shortness of breath         07/26/2024    8:34 AM 04/01/2024   10:35 AM 05/14/2023   11:23 AM 04/01/2023   11:15 AM 03/26/2023    8:54 AM  Depression screen PHQ 2/9  Decreased Interest 0 0 0 0 0  Down, Depressed, Hopeless 0 0 0 0 0  PHQ - 2 Score 0 0 0 0 0        05/20/2024    8:57 AM  Fall Risk   Falls in the past year? 0    Patient Care Team: [name], MD as PCP - General (Family Medicine) Jeffrie Oneil BROCKS, MD as PCP - Cardiology (Cardiology) Cindie Ole DASEN, MD as PCP - Electrophysiology (Cardiology) Brien Belvie FORBES, MD as Attending Physician (Pulmonary Disease) Darlean Ozell NOVAK, MD as Consulting Physician (Pulmonary Disease) [facility] as Registered Nurse [facility] and Palliative Medicine) Carlin Delon BROCKS, NP as Nurse Practitioner (Cardiology)   Review of Systems  Constitutional:  Negative for appetite change, fatigue and fever.  HENT:  Negative for  congestion, ear pain, sinus pressure and sore throat.   Eyes: Negative.  Negative for visual disturbance.  Respiratory:  Negative for cough, chest tightness, shortness of breath and wheezing.   Cardiovascular:  Negative for chest pain and palpitations.  Gastrointestinal:  Negative for abdominal pain, constipation, diarrhea, nausea and vomiting.  Endocrine: Negative.   Genitourinary:  Negative for dysuria, frequency, hematuria and urgency.  Musculoskeletal:  Negative for arthralgias, back pain, joint swelling and myalgias.  Skin:  Negative for rash.  Allergic/Immunologic: Negative.   Neurological:  Negative for dizziness, weakness, light-headedness and headaches.  Psychiatric/Behavioral:  Negative for dysphoric mood. The patient is not nervous/anxious.     Current Outpatient Medications on File Prior to Visit  Medication Sig Dispense Refill   Accu-Chek Softclix Lancets lancets Check fasting sugar once daily. 100 each 3   acetaminophen  (TYLENOL ) 500 MG tablet Take 1,000 mg by mouth every 8 (eight) hours as needed for mild pain (pain score 1-3) (or headaches).     albuterol  (PROAIR  HFA) 108 (90 Base) MCG/ACT inhaler Inhale 2 puffs into the lungs every 6 (six) hours as needed for wheezing or shortness of breath.     albuterol  (PROVENTIL ) (2.5 MG/3ML) 0.083% nebulizer solution Take 3 mLs (2.5 mg total) by nebulization every 6 (six) hours as needed for shortness of breath or wheezing. 360 mL 5   amLODipine  (NORVASC ) 5 MG tablet Take 1 tablet (5 mg  total) by mouth daily. 30 tablet 1   Ascorbic Acid  (VITAMIN C ) 1000 MG tablet Take 1,000 mg by mouth daily.     aspirin  EC 81 MG tablet Take 1 tablet (81 mg total) by mouth daily. Swallow whole. 90 tablet 3   atorvastatin  (LIPITOR) 40 MG tablet TAKE 1 TABLET BY MOUTH EVERYDAY AT BEDTIME (Patient taking differently: Take 40 mg by mouth at bedtime.) 90 tablet 1   blood glucose meter kit and supplies KIT Dispense based on patient and insurance preference.  Use up to four times daily as directed. 1 each 0   Blood Glucose Monitoring Suppl DEVI Use to test blood sugar up to twice daily. May substitute to any manufacturer covered by patient's insurance. E11.0 1 each 0   budesonide -glycopyrrolate -formoterol  (BREZTRI  AEROSPHERE) 160-9-4.8 MCG/ACT AERO inhaler Inhale 2 puffs into the lungs 2 (two) times daily. 3 each 4   Cholecalciferol  (VITAMIN D3) 50 MCG (2000 UT) TABS Take 2,000 Units by mouth in the morning.     dapagliflozin  propanediol (FARXIGA ) 10 MG TABS tablet Take 1 tablet (10 mg total) by mouth daily. 90 tablet 4   denosumab  (PROLIA ) 60 MG/ML SOSY injection Inject 60 mg into the skin every 6 (six) months. 180 mL 0   digoxin  (LANOXIN ) 0.125 MG tablet TAKE 1 TABLET BY MOUTH DAILY 90 tablet 2   ferrous sulfate  325 (65 FE) MG tablet Take 325 mg by mouth 2 (two) times daily with a meal. (Patient taking differently: Take 325 mg by mouth daily with breakfast.)     fluticasone  (FLONASE ) 50 MCG/ACT nasal spray Place 2 sprays into both nostrils daily as needed for allergies or rhinitis.     furosemide  (LASIX ) 40 MG tablet Take 1 tablet (40 mg total) by mouth daily. 30 tablet 1   Glucose Blood (BLOOD GLUCOSE TEST STRIPS) STRP Use to test blood sugar up to twice daily. May substitute to any manufacturer covered by patient's insurance. E11.0 100 strip 3   Guaifenesin  (MUCINEX  MAXIMUM STRENGTH) 1200 MG TB12 Take 1,200 mg by mouth in the morning and at bedtime.     ipratropium-albuterol  (DUONEB) 0.5-2.5 (3) MG/3ML SOLN Take 3 mLs by nebulization 2 (two) times daily for 10 days. 90 mL 0   Lancet Device MISC Use to test blood sugar up to twice daily. May substitute to any manufacturer covered by patient's insurance. E11.0 1 each 0   Lancets Misc. MISC Use to test blood sugar up to twice daily. May substitute to any manufacturer covered by patient's insurance. E11.0 100 each 3   levothyroxine  (SYNTHROID ) 25 MCG tablet Take 1 tablet (25 mcg total) by mouth daily.  (Patient taking differently: Take 25 mcg by mouth daily before breakfast.) 90 tablet 3   loratadine  (CLARITIN ) 10 MG tablet Take 10 mg by mouth in the morning.     Melatonin 12 MG TABS Take 12 mg by mouth at bedtime.     Multiple Vitamin (MULTIVITAMIN WITH MINERALS) TABS tablet Take 1 tablet by mouth daily with breakfast.     NON FORMULARY Take 1 capsule by mouth See admin instructions. Triple Strength Omega 3 Fish Oil 1200 mg  EPA & DHA capsules - Take 1 capsule by mouth at bedtime     NUCALA  100 MG/ML SOSY Inject 100 mg into the skin every 30 (thirty) days.     OXYGEN  Inhale 2-2.5 L/min into the lungs See admin instructions. 2-2.5 L/min at bedtime and as needed during the day and/or with  exercise/activity- for shortness of  breath     pantoprazole  (PROTONIX ) 40 MG tablet Take 1 tablet (40 mg total) by mouth 2 (two) times daily. (Patient taking differently: Take 40 mg by mouth 2 (two) times daily before a meal.) 180 tablet 3   predniSONE  (DELTASONE ) 20 MG tablet Take 2.5 tablets (50 mg total) by mouth daily with breakfast for 2 days, THEN 2 tablets (40 mg total) daily with breakfast for 3 days, THEN 1.5 tablets (30 mg total) daily with breakfast for 2 days, THEN 1 tablet (20 mg total) daily with breakfast for 3 days. Then resume usual 10 mg daily indefinitely. 17 tablet 0   Probiotic Product (PROBIOTIC DAILY) CAPS Take 1 capsule by mouth in the morning.     sertraline  (ZOLOFT ) 50 MG tablet TAKE 1 TABLET BY MOUTH EVERYDAY AT BEDTIME (Patient taking differently: Take 50 mg by mouth at bedtime.) 90 tablet 1   ALPRAZolam  (XANAX ) 0.5 MG tablet Take 1 tablet (0.5 mg total) by mouth 2 (two) times daily as needed for anxiety. (Patient not taking: Reported on 09/16/2024) 30 tablet 0   [Paused] predniSONE  (DELTASONE ) 10 MG tablet TAKE 1 TABLET (10 MG TOTAL) BY MOUTH DAILY WITH BREAKFAST. (Patient not taking: Reported on 09/16/2024) 90 tablet 1   Current Facility-Administered Medications on File Prior to Visit   Medication Dose Route Frequency Provider Last Rate Last Admin   [START ON 10/30/2024] denosumab  (PROLIA ) injection 60 mg  60 mg Subcutaneous Once [name], MD       Past Medical History:  Diagnosis Date   Anemia    Aortic atherosclerosis    Aspiration pneumonia due to inhalation of vomitus (HCC) 02/03/2022   Atrial fibrillation (HCC)    Back pain    COPD (chronic obstructive pulmonary disease) (HCC)    COPD exacerbation (HCC) 12/27/2021   COVID-19 12/24/2020   Diabetes mellitus without complication (HCC)    Dizziness 04/08/2022   Elevated PSA    GAD (generalized anxiety disorder)    GERD (gastroesophageal reflux disease)    High cholesterol    Hypertension    Lingular pneumonia 01/05/2021   See cxr 01/06/20 rx with zpak/ cefipime x 5 days and then developed purulent sputum again 01/12/21 so rx levcaquin 500 mg daily x 7 days then return for cxr before more    Lung nodule < 6cm on CT 05/29/2016   Osteoporosis    Oxygen  deficiency    Pneumonia    January 2022   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 Cindie with a 20 mm Watchman FLX device   RSV bronchitis 01/27/2022   Vertebral compression fracture (HCC) 05/29/2019   T8 compression fracture noted on CT scan 05/28/2019 inpatient with chronic steroid dependent COPD - rx calcitonin nasal spray rx per PCP    Past Surgical History:  Procedure Laterality Date   CATARACT EXTRACTION Right    ELBOW SURGERY  07/2022   surgery on olecranon bursa. Dr. Alyse   IR KYPHO EA ADDL LEVEL THORACIC OR LUMBAR  11/28/2021   LEFT ATRIAL APPENDAGE OCCLUSION N/A 08/02/2021   Procedure: LEFT ATRIAL APPENDAGE OCCLUSION;  Surgeon: Wonda Sharper, MD;  Location: [facility] INVASIVE CV LAB;  Service: Cardiovascular;  Laterality: N/A;   SKIN SURGERY     shoulders and chest   TEE WITHOUT CARDIOVERSION N/A 08/02/2021   Procedure: TRANSESOPHAGEAL ECHOCARDIOGRAM (TEE);  Surgeon: Wonda Sharper, MD;  Location: [facility]  INVASIVE CV LAB;  Service: Cardiovascular;  Laterality: N/A;   TEE WITHOUT  CARDIOVERSION N/A 09/13/2021   Procedure: TRANSESOPHAGEAL ECHOCARDIOGRAM (TEE);  Surgeon: Francyne Headland, MD;  Location: [facility] ENDOSCOPY;  Service: Cardiovascular;  Laterality: N/A;    Family History  Problem Relation Age of Onset   Heart disease Brother    Heart disease Mother    Heart disease Father    Cancer Paternal Uncle    Social History   Socioeconomic History   Marital status: Married    Spouse name: Not on file   Number of children: 3   Years of education: Not on file   Highest education level: 10th grade  Occupational History   Occupation: retired    Comment: truck Hospital doctor  Tobacco Use   Smoking status: Former    Current packs/day: 0.00    Average packs/day: 2.0 packs/day for 52.0 years (104.0 ttl pk-yrs)    Types: Cigarettes    Start date: 02/03/1957    Quit date: 02/03/2009    Years since quitting: 15.6   Smokeless tobacco: Never   Tobacco comments:    Counseled to remain smoke free  Vaping Use   Vaping status: Never Used  Substance and Sexual Activity   Alcohol use: Not Currently    Alcohol/week: 0.0 standard drinks of alcohol    Comment: occ   Drug use: No   Sexual activity: Not on file  Other Topics Concern   Not on file  Social History Narrative   Originally from [place]. Previously worked driving a Surveyor, minerals. No pets currently. No mold exposure. 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       wears sunscreen, brushes and flosses daily, see's dentist bi-annually, has smoke/carbon monoxide detectors, wears a seatbelt and practices gun safety      Social Drivers of Health   Financial Resource Strain: Low Risk  (07/19/2023)   Overall Financial Resource Strain (CARDIA)    Difficulty of Paying Living Expenses: Not hard at all  Food Insecurity: No Food Insecurity (09/06/2024)   Hunger Vital Sign    Worried About Running  Out of Food in the Last Year: Never true    Ran Out of Food in the Last Year: Never true  Transportation Needs: No Transportation Needs (09/06/2024)   PRAPARE - Administrator, Civil Service (Medical): No    Lack of Transportation (Non-Medical): No  Physical Activity: Insufficiently Active (07/19/2023)   Exercise Vital Sign    Days of Exercise per Week: 3 days    Minutes of Exercise per Session: 30 min  Stress: No Stress Concern Present (07/19/2023)   [employer] of Occupational Health - Occupational Stress Questionnaire    Feeling of Stress : Not at all  Social Connections: Socially Integrated (09/06/2024)   Social Connection and Isolation Panel    Frequency of Communication with Friends and Family: More than three times a week    Frequency of Social Gatherings with Friends and Family: More than three times a week    Attends Religious Services: More than 4 times per year    Active Member of [employer] or Organizations: Yes    Attends Engineer, structural: More than 4 times per year    Marital Status: Married    Objective:  BP 130/64   Pulse 94   Temp 97.9 F (36.6 C) (Temporal)   Resp 20   Ht 5' 5 (1.651 m)   Wt 159 lb 3.2 oz (72.2 kg)   SpO2 96%  BMI 26.49 kg/m      09/16/2024    9:51 AM 09/10/2024    4:00 PM 09/10/2024    1:55 PM  BP/Weight  Systolic BP 130 120 169  Diastolic BP 64 56 54  Wt. (Lbs) 159.2 155.4   BMI 26.49 kg/m2 25.86 kg/m2     Physical Exam Constitutional:      General: He is not in acute distress.    Appearance: Normal appearance. He is not ill-appearing.  Eyes:     Conjunctiva/sclera: Conjunctivae normal.  Cardiovascular:     Rate and Rhythm: Normal rate and regular rhythm.     Heart sounds: Normal heart sounds. No murmur heard. Pulmonary:     Effort: Pulmonary effort is normal. No respiratory distress.     Breath sounds: Normal breath sounds. No wheezing or rhonchi.  Musculoskeletal:        General: Normal range of  motion.  Skin:    General: Skin is warm.  Neurological:     Mental Status: He is alert. Mental status is at baseline.  Psychiatric:        Mood and Affect: Mood normal.        Behavior: Behavior normal.     Lab Results  Component Value Date   WBC 14.6 (H) 09/10/2024   HGB 12.4 (L) 09/10/2024   HCT 42.1 09/10/2024   PLT 316 09/10/2024   GLUCOSE 121 (H) 09/10/2024   CHOL 158 07/05/2024   TRIG 199 (H) 07/05/2024   HDL 57 07/05/2024   LDLCALC 68 07/05/2024   ALT 33 09/09/2024   AST 22 09/09/2024   NA 137 09/10/2024   K 3.5 09/10/2024   CL 100 09/10/2024   CREATININE 1.00 09/10/2024   BUN 29 (H) 09/10/2024   CO2 29 09/10/2024   TSH 0.495 09/08/2024   INR 0.9 01/25/2023   HGBA1C 6.3 (H) 07/05/2024    Results for orders placed or performed during the hospital encounter of 09/06/24  CBC with Differential   Collection Time: 09/06/24  5:23 PM  Result Value Ref Range   WBC 11.2 (H) 4.0 - 10.5 K/uL   RBC 5.52 4.22 - 5.81 MIL/uL   Hemoglobin 12.5 (L) 13.0 - 17.0 g/dL   HCT 57.5 60.9 - 47.9 %   MCV 76.8 (L) 80.0 - 100.0 fL   MCH 22.6 (L) 26.0 - 34.0 pg   MCHC 29.5 (L) 30.0 - 36.0 g/dL   RDW 78.4 (H) 88.4 - 84.4 %   Platelets 304 150 - 400 K/uL   nRBC 0.0 0.0 - 0.2 %   Neutrophils Relative % 85 %   Neutro Abs 9.6 (H) 1.7 - 7.7 K/uL   Lymphocytes Relative 9 %   Lymphs Abs 1.0 0.7 - 4.0 K/uL   Monocytes Relative 5 %   Monocytes Absolute 0.6 0.1 - 1.0 K/uL   Eosinophils Relative 0 %   Eosinophils Absolute 0.0 0.0 - 0.5 K/uL   Basophils Relative 0 %   Basophils Absolute 0.0 0.0 - 0.1 K/uL   WBC Morphology MORPHOLOGY UNREMARKABLE    Smear Review Normal platelet morphology    Immature Granulocytes 1 %   Abs Immature Granulocytes 0.06 0.00 - 0.07 K/uL   Ovalocytes PRESENT   Comprehensive metabolic panel   Collection Time: 09/06/24  5:23 PM  Result Value Ref Range   Sodium 141 135 - 145 mmol/L   Potassium 4.7 3.5 - 5.1 mmol/L   Chloride 100 98 - 111 mmol/L   CO2 29 22 -  32 mmol/L   Glucose, Bld 140 (H) 70 - 99 mg/dL   BUN 15 8 - 23 mg/dL   Creatinine, Ser 9.02 0.61 - 1.24 mg/dL   Calcium  9.6 8.9 - 10.3 mg/dL   Total Protein 7.0 6.5 - 8.1 g/dL   Albumin  4.4 3.5 - 5.0 g/dL   AST 29 15 - 41 U/L   ALT 41 0 - 44 U/L   Alkaline Phosphatase 97 38 - 126 U/L   Total Bilirubin 0.3 0.0 - 1.2 mg/dL   GFR, Estimated >39 >39 mL/min   Anion gap 12 5 - 15  Troponin T, High Sensitivity   Collection Time: 09/06/24  5:23 PM  Result Value Ref Range   Troponin T High Sensitivity <15 0 - 19 ng/L  Resp panel by RT-PCR (RSV, Flu A&B, Covid) Anterior Nasal Swab   Collection Time: 09/06/24  5:32 PM   Specimen: Anterior Nasal Swab  Result Value Ref Range   SARS Coronavirus 2 by RT PCR NEGATIVE NEGATIVE   Influenza A by PCR NEGATIVE NEGATIVE   Influenza B by PCR NEGATIVE NEGATIVE   Resp Syncytial Virus by PCR NEGATIVE NEGATIVE  Lactic acid, plasma   Collection Time: 09/06/24  5:32 PM  Result Value Ref Range   Lactic Acid, Venous 2.5 (HH) 0.5 - 1.9 mmol/L  Lactic acid, plasma   Collection Time: 09/06/24  7:33 PM  Result Value Ref Range   Lactic Acid, Venous 2.1 (HH) 0.5 - 1.9 mmol/L  Troponin T, High Sensitivity   Collection Time: 09/06/24  7:33 PM  Result Value Ref Range   Troponin T High Sensitivity <15 0 - 19 ng/L  CBC   Collection Time: 09/06/24 10:37 PM  Result Value Ref Range   WBC 11.6 (H) 4.0 - 10.5 K/uL   RBC 5.17 4.22 - 5.81 MIL/uL   Hemoglobin 11.6 (L) 13.0 - 17.0 g/dL   HCT 59.0 60.9 - 47.9 %   MCV 79.1 (L) 80.0 - 100.0 fL   MCH 22.4 (L) 26.0 - 34.0 pg   MCHC 28.4 (L) 30.0 - 36.0 g/dL   RDW 78.4 (H) 88.4 - 84.4 %   Platelets 264 150 - 400 K/uL   nRBC 0.0 0.0 - 0.2 %  Creatinine, serum   Collection Time: 09/06/24 10:37 PM  Result Value Ref Range   Creatinine, Ser 0.95 0.61 - 1.24 mg/dL   GFR, Estimated >39 >39 mL/min  Comprehensive metabolic panel   Collection Time: 09/07/24  5:02 AM  Result Value Ref Range   Sodium 143 135 - 145 mmol/L    Potassium 4.9 3.5 - 5.1 mmol/L   Chloride 103 98 - 111 mmol/L   CO2 26 22 - 32 mmol/L   Glucose, Bld 158 (H) 70 - 99 mg/dL   BUN 18 8 - 23 mg/dL   Creatinine, Ser 9.11 0.61 - 1.24 mg/dL   Calcium  9.7 8.9 - 10.3 mg/dL   Total Protein 6.6 6.5 - 8.1 g/dL   Albumin  4.1 3.5 - 5.0 g/dL   AST 25 15 - 41 U/L   ALT 36 0 - 44 U/L   Alkaline Phosphatase 86 38 - 126 U/L   Total Bilirubin 0.2 0.0 - 1.2 mg/dL   GFR, Estimated >39 >39 mL/min   Anion gap 14 5 - 15  CBC   Collection Time: 09/07/24  5:02 AM  Result Value Ref Range   WBC 8.7 4.0 - 10.5 K/uL   RBC 5.37 4.22 - 5.81 MIL/uL   Hemoglobin 11.8 (  L) 13.0 - 17.0 g/dL   HCT 57.3 60.9 - 47.9 %   MCV 79.3 (L) 80.0 - 100.0 fL   MCH 22.0 (L) 26.0 - 34.0 pg   MCHC 27.7 (L) 30.0 - 36.0 g/dL   RDW 78.2 (H) 88.4 - 84.4 %   Platelets 293 150 - 400 K/uL   nRBC 0.0 0.0 - 0.2 %  Digoxin  level   Collection Time: 09/07/24  5:02 AM  Result Value Ref Range   Digoxin  Level 1.0 0.8 - 2.0 ng/mL  Pro Brain natriuretic peptide   Collection Time: 09/07/24  5:02 AM  Result Value Ref Range   Pro Brain Natriuretic Peptide 259.0 <300.0 pg/mL  Glucose, capillary   Collection Time: 09/07/24  7:43 AM  Result Value Ref Range   Glucose-Capillary 126 (H) 70 - 99 mg/dL   Comment 1 Notify RN   Glucose, capillary   Collection Time: 09/07/24  5:31 PM  Result Value Ref Range   Glucose-Capillary 107 (H) 70 - 99 mg/dL   Comment 1 Notify RN   Glucose, capillary   Collection Time: 09/07/24  9:11 PM  Result Value Ref Range   Glucose-Capillary 200 (H) 70 - 99 mg/dL  Comprehensive metabolic panel with GFR   Collection Time: 09/08/24  5:29 AM  Result Value Ref Range   Sodium 143 135 - 145 mmol/L   Potassium 4.5 3.5 - 5.1 mmol/L   Chloride 104 98 - 111 mmol/L   CO2 26 22 - 32 mmol/L   Glucose, Bld 110 (H) 70 - 99 mg/dL   BUN 30 (H) 8 - 23 mg/dL   Creatinine, Ser 8.93 0.61 - 1.24 mg/dL   Calcium  9.4 8.9 - 10.3 mg/dL   Total Protein 6.5 6.5 - 8.1 g/dL   Albumin   4.0 3.5 - 5.0 g/dL   AST 20 15 - 41 U/L   ALT 35 0 - 44 U/L   Alkaline Phosphatase 80 38 - 126 U/L   Total Bilirubin 0.3 0.0 - 1.2 mg/dL   GFR, Estimated >39 >39 mL/min   Anion gap 13 5 - 15  CBC with Differential/Platelet   Collection Time: 09/08/24  5:29 AM  Result Value Ref Range   WBC 14.8 (H) 4.0 - 10.5 K/uL   RBC 5.33 4.22 - 5.81 MIL/uL   Hemoglobin 12.0 (L) 13.0 - 17.0 g/dL   HCT 57.9 60.9 - 47.9 %   MCV 78.8 (L) 80.0 - 100.0 fL   MCH 22.5 (L) 26.0 - 34.0 pg   MCHC 28.6 (L) 30.0 - 36.0 g/dL   RDW 78.1 (H) 88.4 - 84.4 %   Platelets 299 150 - 400 K/uL   nRBC 0.0 0.0 - 0.2 %   Neutrophils Relative % 90 %   Neutro Abs 13.3 (H) 1.7 - 7.7 K/uL   Lymphocytes Relative 5 %   Lymphs Abs 0.8 0.7 - 4.0 K/uL   Monocytes Relative 5 %   Monocytes Absolute 0.7 0.1 - 1.0 K/uL   Eosinophils Relative 0 %   Eosinophils Absolute 0.0 0.0 - 0.5 K/uL   Basophils Relative 0 %   Basophils Absolute 0.0 0.0 - 0.1 K/uL   WBC Morphology MORPHOLOGY UNREMARKABLE    Smear Review Normal platelet morphology    Immature Granulocytes 0 %   Abs Immature Granulocytes 0.06 0.00 - 0.07 K/uL   Ovalocytes PRESENT   Phosphorus   Collection Time: 09/08/24  5:29 AM  Result Value Ref Range   Phosphorus 4.9 (H) 2.5 - 4.6 mg/dL  Magnesium    Collection Time: 09/08/24  5:29 AM  Result Value Ref Range   Magnesium  2.5 (H) 1.7 - 2.4 mg/dL  TSH   Collection Time: 09/08/24  5:29 AM  Result Value Ref Range   TSH 0.495 0.350 - 4.500 uIU/mL  Glucose, capillary   Collection Time: 09/08/24  7:45 AM  Result Value Ref Range   Glucose-Capillary 145 (H) 70 - 99 mg/dL   Comment 1 Notify RN   Respiratory (~20 pathogens) panel by PCR   Collection Time: 09/08/24 10:39 AM   Specimen: Nasopharyngeal Swab; Respiratory  Result Value Ref Range   Adenovirus NOT DETECTED NOT DETECTED   Coronavirus 229E NOT DETECTED NOT DETECTED   Coronavirus HKU1 NOT DETECTED NOT DETECTED   Coronavirus NL63 NOT DETECTED NOT DETECTED    Coronavirus OC43 NOT DETECTED NOT DETECTED   Metapneumovirus NOT DETECTED NOT DETECTED   Rhinovirus / Enterovirus NOT DETECTED NOT DETECTED   Influenza A NOT DETECTED NOT DETECTED   Influenza B NOT DETECTED NOT DETECTED   Parainfluenza Virus 1 NOT DETECTED NOT DETECTED   Parainfluenza Virus 2 NOT DETECTED NOT DETECTED   Parainfluenza Virus 3 NOT DETECTED NOT DETECTED   Parainfluenza Virus 4 NOT DETECTED NOT DETECTED   Respiratory Syncytial Virus NOT DETECTED NOT DETECTED   Bordetella pertussis NOT DETECTED NOT DETECTED   Bordetella Parapertussis NOT DETECTED NOT DETECTED   Chlamydophila pneumoniae NOT DETECTED NOT DETECTED   Mycoplasma pneumoniae NOT DETECTED NOT DETECTED  Glucose, capillary   Collection Time: 09/08/24 11:59 AM  Result Value Ref Range   Glucose-Capillary 154 (H) 70 - 99 mg/dL   Comment 1 Notify RN   CBC   Collection Time: 09/09/24 10:14 AM  Result Value Ref Range   WBC 13.6 (H) 4.0 - 10.5 K/uL   RBC 5.25 4.22 - 5.81 MIL/uL   Hemoglobin 12.0 (L) 13.0 - 17.0 g/dL   HCT 59.9 60.9 - 47.9 %   MCV 76.2 (L) 80.0 - 100.0 fL   MCH 22.9 (L) 26.0 - 34.0 pg   MCHC 30.0 30.0 - 36.0 g/dL   RDW 79.0 (H) 88.4 - 84.4 %   Platelets 276 150 - 400 K/uL   nRBC 0.0 0.0 - 0.2 %  Comprehensive metabolic panel   Collection Time: 09/09/24 10:14 AM  Result Value Ref Range   Sodium 141 135 - 145 mmol/L   Potassium 4.0 3.5 - 5.1 mmol/L   Chloride 101 98 - 111 mmol/L   CO2 28 22 - 32 mmol/L   Glucose, Bld 143 (H) 70 - 99 mg/dL   BUN 32 (H) 8 - 23 mg/dL   Creatinine, Ser 8.70 (H) 0.61 - 1.24 mg/dL   Calcium  8.9 8.9 - 10.3 mg/dL   Total Protein 6.2 (L) 6.5 - 8.1 g/dL   Albumin  3.4 (L) 3.5 - 5.0 g/dL   AST 22 15 - 41 U/L   ALT 33 0 - 44 U/L   Alkaline Phosphatase 67 38 - 126 U/L   Total Bilirubin 0.6 0.0 - 1.2 mg/dL   GFR, Estimated 56 (L) >60 mL/min   Anion gap 12 5 - 15  CBC   Collection Time: 09/10/24 12:10 PM  Result Value Ref Range   WBC 14.6 (H) 4.0 - 10.5 K/uL   RBC 5.47  4.22 - 5.81 MIL/uL   Hemoglobin 12.4 (L) 13.0 - 17.0 g/dL   HCT 57.8 60.9 - 47.9 %   MCV 77.0 (L) 80.0 - 100.0 fL   MCH 22.7 (L) 26.0 -  34.0 pg   MCHC 29.5 (L) 30.0 - 36.0 g/dL   RDW 78.9 (H) 88.4 - 84.4 %   Platelets 316 150 - 400 K/uL   nRBC 0.0 0.0 - 0.2 %  Basic metabolic panel with GFR   Collection Time: 09/10/24 12:10 PM  Result Value Ref Range   Sodium 137 135 - 145 mmol/L   Potassium 3.5 3.5 - 5.1 mmol/L   Chloride 100 98 - 111 mmol/L   CO2 29 22 - 32 mmol/L   Glucose, Bld 121 (H) 70 - 99 mg/dL   BUN 29 (H) 8 - 23 mg/dL   Creatinine, Ser 8.99 0.61 - 1.24 mg/dL   Calcium  8.8 (L) 8.9 - 10.3 mg/dL   GFR, Estimated >39 >39 mL/min   Anion gap 8 5 - 15  .  Assessment & Plan:   Assessment & Plan Hypertension associated with diabetes (HCC) Hypertension well-controlled at 130/64 mmHg. Previous elevations linked to prednisone  and anxiety. 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 Darlean  Orders:   budesonide -glycopyrrolate -formoterol  (BREZTRI  AEROSPHERE) 160-9-4.8 MCG/ACT AERO inhaler; Inhale 2 puffs into the lungs 2 (two) times daily.  Immunization due  Orders:   Flu vaccine HIGH DOSE PF(Fluzone Trivalent)    Follow-up: Return if symptoms worsen or fail to improve, for keep appt w Dr. Sherre.  An After Visit Summary was printed and given to the patient.  Harrie Cedar, FNP [facility] [phone]

## 2024-09-16 NOTE — Assessment & Plan Note (Addendum)
 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 Darlean  Orders:   budesonide -glycopyrrolate -formoterol  (BREZTRI  AEROSPHERE) 160-9-4.8 MCG/ACT AERO inhaler; Inhale 2 puffs into the lungs 2 (two) times daily.

## 2024-09-16 NOTE — Patient Instructions (Signed)
  VISIT SUMMARY: Today, you came in for a follow-up on your blood pressure management. Your blood pressure has been well-controlled at 130/64 mmHg, and we discussed the impact of prednisone  and anxiety on your previous elevated readings. We also addressed your concerns about low oxygen  levels.  YOUR PLAN: -HYPERTENSION: Hypertension, or high blood pressure, is when the force of the blood against your artery walls is too high. Your blood pressure is currently well-controlled at 130/64 mmHg. We will continue with your current management plan and monitor your blood pressure regularly. If you do not have any symptoms, you can reduce the frequency of your blood pressure checks. If any symptoms develop, please let us  know immediately.  -HYPOXEMIA: Hypoxemia is when you have lower than normal levels of oxygen  in your blood. We noted that your oxygen  saturation has dropped to 70% at times, which is concerning. We will need to monitor this closely and may need to conduct further tests to understand the cause and find the appropriate treatment.  INSTRUCTIONS: Please continue to monitor your blood pressure regularly and reduce the frequency of checks if you are not experiencing any symptoms. If you notice any new symptoms or if your oxygen  levels drop again, contact us  immediately for further evaluation.                      Contains text generated by Abridge.                                 Contains text generated by Abridge.

## 2024-09-16 NOTE — Assessment & Plan Note (Addendum)
 Hypertension well-controlled at 130/64 mmHg. Previous elevations linked to prednisone  and anxiety. BP Readings from Last 3 Encounters:  09/16/24 130/64  09/10/24 (!) 120/56  09/02/24 (!) 145/66  - Monitor blood pressure when symptomatic  - Continue amlodipine  2.5mg  - Continue Coreg  3.125mg  BID

## 2024-09-16 NOTE — Assessment & Plan Note (Signed)
  Orders:   Flu vaccine HIGH DOSE PF(Fluzone Trivalent)

## 2024-09-22 ENCOUNTER — Other Ambulatory Visit: Payer: Self-pay | Admitting: Family Medicine

## 2024-09-22 ENCOUNTER — Other Ambulatory Visit: Payer: Self-pay

## 2024-09-22 MED ORDER — ALPRAZOLAM 0.5 MG PO TABS: 0.5000 mg | ORAL_TABLET | Freq: Two times a day (BID) | ORAL | 1 refills | Status: DC | PRN

## 2024-09-23 ENCOUNTER — Telehealth: Payer: Self-pay

## 2024-09-23 ENCOUNTER — Other Ambulatory Visit: Payer: Self-pay | Admitting: Family Medicine

## 2024-09-23 MED ORDER — ALPRAZOLAM 0.5 MG PO TABS: 0.5000 mg | ORAL_TABLET | Freq: Two times a day (BID) | ORAL | 1 refills | Status: DC | PRN

## 2024-09-23 NOTE — Telephone Encounter (Signed)
 2026 Renewal  PAP: Patient assistance application for Breztri  and Farxiga  through [employer] ([employer]) has been mailed to pt's home address on file. Provider portion of application will be faxed to provider's office.  Provider portion of application has been faxed to Dr. [name] Free

## 2024-09-23 NOTE — Telephone Encounter (Unsigned)
 Copied from CRM [id number]. Topic: Clinical - Prescription Issue >> Sep 23, 2024  9:44 AM Cleave MATSU wrote: Reason for CRM: pt [facility] said [facility] Al[facility]olam  please call pt wife back to assist

## 2024-09-24 MED ORDER — ALPRAZOLAM 0.5 MG PO TABS: 0.5000 mg | ORAL_TABLET | Freq: Two times a day (BID) | ORAL | 0 refills | Status: DC | PRN

## 2024-09-27 ENCOUNTER — Encounter (HOSPITAL_COMMUNITY): Payer: Self-pay | Admitting: Family Medicine

## 2024-09-27 ENCOUNTER — Inpatient Hospital Stay (HOSPITAL_BASED_OUTPATIENT_CLINIC_OR_DEPARTMENT_OTHER)
Admission: EM | Admit: 2024-09-27 | Discharge: 2024-09-30 | DRG: 189 | Disposition: A | Discharge status: Home / Self Care | Attending: Internal Medicine | Admitting: Internal Medicine

## 2024-09-27 ENCOUNTER — Emergency Department (HOSPITAL_BASED_OUTPATIENT_CLINIC_OR_DEPARTMENT_OTHER)

## 2024-09-27 ENCOUNTER — Other Ambulatory Visit: Payer: Self-pay

## 2024-09-27 DIAGNOSIS — E782 Mixed hyperlipidemia: Secondary | ICD-10-CM | POA: Diagnosis present

## 2024-09-27 DIAGNOSIS — J9622 Acute and chronic respiratory failure with hypercapnia: Secondary | ICD-10-CM | POA: Diagnosis not present

## 2024-09-27 DIAGNOSIS — I110 Hypertensive heart disease with heart failure: Secondary | ICD-10-CM | POA: Diagnosis present

## 2024-09-27 DIAGNOSIS — G4733 Obstructive sleep apnea (adult) (pediatric): Secondary | ICD-10-CM | POA: Diagnosis not present

## 2024-09-27 DIAGNOSIS — Z79620 Long term (current) use of immunosuppressive biologic: Secondary | ICD-10-CM

## 2024-09-27 DIAGNOSIS — Z888 Allergy status to other drugs, medicaments and biological substances status: Secondary | ICD-10-CM

## 2024-09-27 DIAGNOSIS — K219 Gastro-esophageal reflux disease without esophagitis: Secondary | ICD-10-CM | POA: Diagnosis present

## 2024-09-27 DIAGNOSIS — R0602 Shortness of breath: Secondary | ICD-10-CM | POA: Diagnosis not present

## 2024-09-27 DIAGNOSIS — I480 Paroxysmal atrial fibrillation: Secondary | ICD-10-CM | POA: Diagnosis not present

## 2024-09-27 DIAGNOSIS — Z7982 Long term (current) use of aspirin: Secondary | ICD-10-CM

## 2024-09-27 DIAGNOSIS — J441 Chronic obstructive pulmonary disease with (acute) exacerbation: Secondary | ICD-10-CM | POA: Diagnosis present

## 2024-09-27 DIAGNOSIS — F411 Generalized anxiety disorder: Secondary | ICD-10-CM | POA: Diagnosis present

## 2024-09-27 DIAGNOSIS — I5032 Chronic diastolic (congestive) heart failure: Secondary | ICD-10-CM | POA: Diagnosis not present

## 2024-09-27 DIAGNOSIS — F419 Anxiety disorder, unspecified: Secondary | ICD-10-CM | POA: Diagnosis present

## 2024-09-27 DIAGNOSIS — E8729 Other acidosis: Secondary | ICD-10-CM | POA: Diagnosis present

## 2024-09-27 DIAGNOSIS — M810 Age-related osteoporosis without current pathological fracture: Secondary | ICD-10-CM | POA: Diagnosis present

## 2024-09-27 DIAGNOSIS — Z79899 Other long term (current) drug therapy: Secondary | ICD-10-CM

## 2024-09-27 DIAGNOSIS — Z79890 Hormone replacement therapy (postmenopausal): Secondary | ICD-10-CM | POA: Diagnosis not present

## 2024-09-27 DIAGNOSIS — Z1152 Encounter for screening for COVID-19: Secondary | ICD-10-CM | POA: Diagnosis not present

## 2024-09-27 DIAGNOSIS — Z9981 Dependence on supplemental oxygen: Secondary | ICD-10-CM

## 2024-09-27 DIAGNOSIS — E1169 Type 2 diabetes mellitus with other specified complication: Secondary | ICD-10-CM | POA: Diagnosis not present

## 2024-09-27 DIAGNOSIS — J9621 Acute and chronic respiratory failure with hypoxia: Secondary | ICD-10-CM | POA: Diagnosis not present

## 2024-09-27 DIAGNOSIS — Z8616 Personal history of COVID-19: Secondary | ICD-10-CM

## 2024-09-27 DIAGNOSIS — Z95818 Presence of other cardiac implants and grafts: Secondary | ICD-10-CM

## 2024-09-27 DIAGNOSIS — Z8701 Personal history of pneumonia (recurrent): Secondary | ICD-10-CM

## 2024-09-27 DIAGNOSIS — Z7951 Long term (current) use of inhaled steroids: Secondary | ICD-10-CM

## 2024-09-27 DIAGNOSIS — Z809 Family history of malignant neoplasm, unspecified: Secondary | ICD-10-CM

## 2024-09-27 DIAGNOSIS — Z881 Allergy status to other antibiotic agents status: Secondary | ICD-10-CM | POA: Diagnosis not present

## 2024-09-27 DIAGNOSIS — Z91048 Other nonmedicinal substance allergy status: Secondary | ICD-10-CM

## 2024-09-27 DIAGNOSIS — E039 Hypothyroidism, unspecified: Secondary | ICD-10-CM | POA: Diagnosis present

## 2024-09-27 DIAGNOSIS — Z8249 Family history of ischemic heart disease and other diseases of the circulatory system: Secondary | ICD-10-CM

## 2024-09-27 DIAGNOSIS — Z87891 Personal history of nicotine dependence: Secondary | ICD-10-CM

## 2024-09-27 LAB — BASIC METABOLIC PANEL WITH GFR
Anion gap: 14 (ref 5–15)
BUN: 20 mg/dL (ref 8–23)
CO2: 33 mmol/L — ABNORMAL HIGH (ref 22–32)
Calcium: 10.2 mg/dL (ref 8.9–10.3)
Chloride: 96 mmol/L — ABNORMAL LOW (ref 98–111)
Creatinine, Ser: 0.97 mg/dL (ref 0.61–1.24)
GFR, Estimated: >60 mL/min (ref >60)
Glucose, Bld: 148 mg/dL — ABNORMAL HIGH (ref 70–99)
Potassium: 3.9 mmol/L (ref 3.5–5.1)
Sodium: 142 mmol/L (ref 135–145)

## 2024-09-27 LAB — I-STAT VENOUS BLOOD GAS, ED
Acid-Base Excess: 7 mmol/L — ABNORMAL HIGH (ref 0.0–2.0)
Bicarbonate: 36.1 mmol/L — ABNORMAL HIGH (ref 20.0–28.0)
Calcium, Ion: 1.18 mmol/L (ref 1.15–1.40)
HCT: 42 % (ref 39.0–52.0)
Hemoglobin: 14.3 g/dL (ref 13.0–17.0)
O2 Saturation: 54 %
Potassium: 3.9 mmol/L (ref 3.5–5.1)
Sodium: 138 mmol/L (ref 135–145)
TCO2: 38 mmol/L — ABNORMAL HIGH (ref 22–32)
pCO2, Ven: 71.8 mmHg (ref 44–60)
pH, Ven: 7.309 (ref 7.25–7.43)
pO2, Ven: 32 mmHg (ref 32–45)

## 2024-09-27 LAB — CBC
HCT: 42.1 % (ref 39.0–52.0)
Hemoglobin: 12.5 g/dL — ABNORMAL LOW (ref 13.0–17.0)
MCH: 23.4 pg — ABNORMAL LOW (ref 26.0–34.0)
MCHC: 29.7 g/dL — ABNORMAL LOW (ref 30.0–36.0)
MCV: 78.7 fL — ABNORMAL LOW (ref 80.0–100.0)
Platelets: 288 K/uL (ref 150–400)
RBC: 5.35 MIL/uL (ref 4.22–5.81)
RDW: 20.7 % — ABNORMAL HIGH (ref 11.5–15.5)
WBC: 17.6 K/uL — ABNORMAL HIGH (ref 4.0–10.5)
nRBC: 0 % (ref 0.0–0.2)

## 2024-09-27 LAB — GLUCOSE, CAPILLARY: Glucose-Capillary: 256 mg/dL — ABNORMAL HIGH (ref 70–99)

## 2024-09-27 LAB — RESP PANEL BY RT-PCR (RSV, FLU A&B, COVID)  RVPGX2
Influenza A by PCR: NEGATIVE
Influenza B by PCR: NEGATIVE
Resp Syncytial Virus by PCR: NEGATIVE
SARS Coronavirus 2 by RT PCR: NEGATIVE

## 2024-09-27 LAB — TROPONIN T, HIGH SENSITIVITY
Troponin T High Sensitivity: 22 ng/L — ABNORMAL HIGH (ref 0–19)
Troponin T High Sensitivity: 37 ng/L — ABNORMAL HIGH (ref 0–19)

## 2024-09-27 LAB — PRO BRAIN NATRIURETIC PEPTIDE: Pro Brain Natriuretic Peptide: 172 pg/mL (ref <300.0)

## 2024-09-27 MED ORDER — GUAIFENESIN 100 MG/5ML PO LIQD: 5.0000 mL | ORAL | Status: DC | PRN

## 2024-09-27 MED ORDER — SODIUM CHLORIDE 0.9 % IV SOLN
500.0000 mg | INTRAVENOUS | Status: AC
  Administered 2024-09-27: 500 mg via INTRAVENOUS
  Filled 2024-09-27: qty 5

## 2024-09-27 MED ORDER — INSULIN ASPART 100 UNIT/ML IJ SOLN
0.0000 [IU] | Freq: Every day | INTRAMUSCULAR | Status: DC
  Administered 2024-09-27 – 2024-09-28 (×2): 3 [IU] via SUBCUTANEOUS

## 2024-09-27 MED ORDER — ONDANSETRON HCL 4 MG/2ML IJ SOLN: 4.0000 mg | Freq: Four times a day (QID) | INTRAMUSCULAR | Status: DC | PRN

## 2024-09-27 MED ORDER — PANTOPRAZOLE SODIUM 40 MG PO TBEC
40.0000 mg | DELAYED_RELEASE_TABLET | Freq: Two times a day (BID) | ORAL | Status: DC
  Administered 2024-09-28 – 2024-09-30 (×5): 40 mg via ORAL
  Filled 2024-09-27 (×5): qty 1

## 2024-09-27 MED ORDER — METHYLPREDNISOLONE SODIUM SUCC 125 MG IJ SOLR
125.0000 mg | Freq: Two times a day (BID) | INTRAMUSCULAR | Status: AC
  Administered 2024-09-27 – 2024-09-28 (×2): 125 mg via INTRAVENOUS
  Filled 2024-09-27 (×2): qty 2

## 2024-09-27 MED ORDER — ENOXAPARIN SODIUM 40 MG/0.4ML IJ SOSY
40.0000 mg | PREFILLED_SYRINGE | INTRAMUSCULAR | Status: DC
  Administered 2024-09-27 – 2024-09-29 (×3): 40 mg via SUBCUTANEOUS
  Filled 2024-09-27 (×3): qty 0.4

## 2024-09-27 MED ORDER — AZITHROMYCIN 500 MG PO TABS
500.0000 mg | ORAL_TABLET | Freq: Every day | ORAL | Status: AC
  Administered 2024-09-28 – 2024-09-29 (×2): 500 mg via ORAL
  Filled 2024-09-27 (×2): qty 1

## 2024-09-27 MED ORDER — IPRATROPIUM-ALBUTEROL 0.5-2.5 (3) MG/3ML IN SOLN: 3.0000 mL | RESPIRATORY_TRACT | Status: DC | PRN

## 2024-09-27 MED ORDER — LEVOTHYROXINE SODIUM 50 MCG PO TABS
25.0000 ug | ORAL_TABLET | Freq: Every day | ORAL | Status: DC
  Administered 2024-09-28 – 2024-09-30 (×3): 25 ug via ORAL
  Filled 2024-09-27 (×3): qty 1

## 2024-09-27 MED ORDER — BUDESON-GLYCOPYRROL-FORMOTEROL 160-9-4.8 MCG/ACT IN AERO
2.0000 | INHALATION_SPRAY | Freq: Two times a day (BID) | RESPIRATORY_TRACT | Status: DC
  Administered 2024-09-28: 2 via RESPIRATORY_TRACT
  Filled 2024-09-27: qty 5.9

## 2024-09-27 MED ORDER — DIGOXIN 125 MCG PO TABS
125.0000 ug | ORAL_TABLET | Freq: Every day | ORAL | Status: DC
  Administered 2024-09-28 – 2024-09-30 (×3): 125 ug via ORAL
  Filled 2024-09-27 (×3): qty 1

## 2024-09-27 MED ORDER — IPRATROPIUM-ALBUTEROL 0.5-2.5 (3) MG/3ML IN SOLN
3.0000 mL | Freq: Once | RESPIRATORY_TRACT | Status: AC
  Administered 2024-09-27: 3 mL via RESPIRATORY_TRACT
  Filled 2024-09-27: qty 3

## 2024-09-27 MED ORDER — ACETAMINOPHEN 325 MG PO TABS: 650.0000 mg | ORAL_TABLET | Freq: Four times a day (QID) | ORAL | Status: DC | PRN

## 2024-09-27 MED ORDER — INSULIN ASPART 100 UNIT/ML IJ SOLN
0.0000 [IU] | Freq: Three times a day (TID) | INTRAMUSCULAR | Status: DC
  Administered 2024-09-28: 2 [IU] via SUBCUTANEOUS
  Administered 2024-09-28 (×2): 1 [IU] via SUBCUTANEOUS
  Administered 2024-09-29: 2 [IU] via SUBCUTANEOUS

## 2024-09-27 MED ORDER — ONDANSETRON HCL 4 MG PO TABS: 4.0000 mg | ORAL_TABLET | Freq: Four times a day (QID) | ORAL | Status: DC | PRN

## 2024-09-27 MED ORDER — SENNOSIDES-DOCUSATE SODIUM 8.6-50 MG PO TABS: 1.0000 | ORAL_TABLET | Freq: Every evening | ORAL | Status: DC | PRN

## 2024-09-27 MED ORDER — AMLODIPINE BESYLATE 5 MG PO TABS
5.0000 mg | ORAL_TABLET | Freq: Every day | ORAL | Status: DC
  Administered 2024-09-28 – 2024-09-30 (×3): 5 mg via ORAL
  Filled 2024-09-27 (×3): qty 1

## 2024-09-27 MED ORDER — ACETAMINOPHEN 650 MG RE SUPP: 650.0000 mg | Freq: Four times a day (QID) | RECTAL | Status: DC | PRN

## 2024-09-27 MED ORDER — ASPIRIN 81 MG PO TBEC
81.0000 mg | DELAYED_RELEASE_TABLET | Freq: Every day | ORAL | Status: DC
  Administered 2024-09-28 – 2024-09-30 (×3): 81 mg via ORAL
  Filled 2024-09-27 (×3): qty 1

## 2024-09-27 MED ORDER — MELATONIN 3 MG PO TABS
12.0000 mg | ORAL_TABLET | Freq: Every day | ORAL | Status: DC
  Administered 2024-09-27 – 2024-09-29 (×3): 12 mg via ORAL
  Filled 2024-09-27 (×3): qty 4

## 2024-09-27 MED ORDER — ALPRAZOLAM 0.5 MG PO TABS: 0.5000 mg | ORAL_TABLET | Freq: Two times a day (BID) | ORAL | Status: DC | PRN

## 2024-09-27 MED ORDER — PREDNISONE 10 MG PO TABS
40.0000 mg | ORAL_TABLET | Freq: Every day | ORAL | Status: DC
  Administered 2024-09-29 – 2024-09-30 (×2): 40 mg via ORAL
  Filled 2024-09-27 (×2): qty 2
  Filled 2024-09-27: qty 4

## 2024-09-27 MED ORDER — SODIUM CHLORIDE 0.9% FLUSH
3.0000 mL | Freq: Two times a day (BID) | INTRAVENOUS | Status: DC
  Administered 2024-09-27 – 2024-09-30 (×5): 3 mL via INTRAVENOUS

## 2024-09-27 MED ORDER — FUROSEMIDE 40 MG PO TABS
40.0000 mg | ORAL_TABLET | Freq: Every day | ORAL | Status: DC
  Administered 2024-09-28 – 2024-09-30 (×3): 40 mg via ORAL
  Filled 2024-09-27 (×3): qty 1

## 2024-09-27 MED ORDER — SERTRALINE HCL 25 MG PO TABS
50.0000 mg | ORAL_TABLET | Freq: Every day | ORAL | Status: DC
  Administered 2024-09-27 – 2024-09-29 (×3): 50 mg via ORAL
  Filled 2024-09-27: qty 1
  Filled 2024-09-27: qty 2
  Filled 2024-09-27: qty 1

## 2024-09-27 MED ORDER — ATORVASTATIN CALCIUM 40 MG PO TABS
40.0000 mg | ORAL_TABLET | Freq: Every day | ORAL | Status: DC
  Administered 2024-09-27 – 2024-09-29 (×3): 40 mg via ORAL
  Filled 2024-09-27 (×3): qty 1

## 2024-09-27 MED ORDER — ALBUTEROL SULFATE (2.5 MG/3ML) 0.083% IN NEBU
2.5000 mg | INHALATION_SOLUTION | RESPIRATORY_TRACT | Status: AC
  Administered 2024-09-27 (×2): 2.5 mg via RESPIRATORY_TRACT
  Filled 2024-09-27 (×2): qty 3

## 2024-09-27 MED ORDER — METHYLPREDNISOLONE SODIUM SUCC 125 MG IJ SOLR
125.0000 mg | Freq: Once | INTRAMUSCULAR | Status: AC
  Administered 2024-09-27: 125 mg via INTRAVENOUS
  Filled 2024-09-27: qty 2

## 2024-09-27 NOTE — ED Notes (Signed)
   09/27/24 0801  Respiratory Assessment  $ RT Protocol Assessment  Yes  Assessment Type Pre-treatment  Respiratory Pattern Regular;Labored;Dyspnea at rest;Pursed lips;Prolonged exhalation;Retractions;Symmetrical  Chest Assessment Chest expansion symmetrical  Cough Non-productive  Bilateral Breath Sounds Diminished;Expiratory wheezes  Oxygen  Therapy/Pulse Ox  SpO2 95 %  O2 Device Nasal Cannula  O2 Flow Rate (L/min) 2 L/min   SpO2 in lobby 84% on home [place] @ 3lpm, after walking into lobby.  Placed in WC to room 5. Placed on 4lpm and weaned down to 2lpm once SpO2 above 92%. BBS distant with faint exp wheeze.

## 2024-09-27 NOTE — Progress Notes (Signed)
 Admitting provider notified of arrival. Will await admit orders.

## 2024-09-27 NOTE — ED Triage Notes (Addendum)
 States has been increasingly short of breath the past week, worse today. Normally wears 2L O2 at night, states has been having to wear it during the day the past week. Tachypneic and increased work of breathing in triage.  Recently admitted for COPD exacerbation

## 2024-09-27 NOTE — Progress Notes (Addendum)
 Patient arrived via carelink, oriented to floor, and CCMD notified. BP (!) 151/62 (BP Location: Right Arm)   Pulse 99   Temp 98.2 F (36.8 C) (Oral)   Resp 20   Ht 5' 5 (1.651 m)   Wt 72 kg   SpO2 96%   BMI 26.41 kg/m

## 2024-09-27 NOTE — ED Provider Notes (Signed)
 Emergency Department Provider Note   I have reviewed the triage vital signs and the nursing notes.   HISTORY  Chief Complaint No chief complaint on file.   HPI Paul [name] is a 78 y.o. male with past history of COPD presents the emergency department with shortness of breath.  Symptoms worsening over the past week and became particularly bad this morning.  He wears home O2 at night but has been having to use it during the day throughout the past several days.  Denies chest pain.  No fevers or chills.  Feeling increasingly short of breath.  Arrives with O2 sats of 84% after ambulation into the ED.    Past Medical History:  Diagnosis Date   Anemia    Aortic atherosclerosis    Aspiration pneumonia due to inhalation of vomitus (HCC) 02/03/2022   Atrial fibrillation (HCC)    Back pain    COPD (chronic obstructive pulmonary disease) (HCC)    COPD exacerbation (HCC) 12/27/2021   COVID-19 12/24/2020   Diabetes mellitus without complication (HCC)    Dizziness 04/08/2022   Elevated PSA    GAD (generalized anxiety disorder)    GERD (gastroesophageal reflux disease)    High cholesterol    Hypertension    Lingular pneumonia 01/05/2021   See cxr 01/06/20 rx with zpak/ cefipime x 5 days and then developed purulent sputum again 01/12/21 so rx levcaquin 500 mg daily x 7 days then return for cxr before more    Lung nodule < 6cm on CT 05/29/2016   Osteoporosis    Oxygen  deficiency    Pneumonia    January 2022   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 Cindie with a 20 mm Watchman FLX device   RSV bronchitis 01/27/2022   Vertebral compression fracture (HCC) 05/29/2019   T8 compression fracture noted on CT scan 05/28/2019 inpatient with chronic steroid dependent COPD - rx calcitonin nasal spray rx per PCP     Review of Systems  Constitutional: No fever/chills Cardiovascular: Denies chest pain. Respiratory: Positive shortness of  breath. Gastrointestinal: No abdominal pain.  No nausea, no vomiting.  Skin: Negative for rash. Neurological: Negative for headaches.  ____________________________________________   PHYSICAL EXAM:  VITAL SIGNS: ED Triage Vitals  Encounter Vitals Group     BP 09/27/24 0808 (!) 173/71     Pulse Rate 09/27/24 0805 (!) 114     Resp 09/27/24 0804 (!) 36     Temp 09/27/24 0810 98 F (36.7 C)     Temp Source 09/27/24 0810 Oral     SpO2 09/27/24 0801 (P) 95 %     Weight 09/27/24 0804 158 lb 11.7 oz (72 kg)     Height 09/27/24 0804 5' 5 (1.651 m)    Constitutional: Alert and oriented. Patient able to provide a brief history due to increased work of breathing.  Eyes: Conjunctivae are normal.  Head: Atraumatic. Nose: No congestion/rhinnorhea. Mouth/Throat: Mucous membranes are moist.   Neck: No stridor.   Cardiovascular: Tachycardia. Good peripheral circulation. Grossly normal heart sounds.   Respiratory: Increased respiratory effort.  No retractions. Lungs with bilateral end expiratory wheezing throughout.  Gastrointestinal: Soft and nontender. No distention.  Musculoskeletal: No lower extremity tenderness nor edema. No gross deformities of extremities. Neurologic:  Normal speech and language.  Skin:  Skin is warm, dry and intact. No rash noted.  ____________________________________________   LABS (all labs ordered are listed, but only abnormal results are displayed)  Labs Reviewed  BASIC METABOLIC PANEL WITH GFR - Abnormal; Notable for the following components:      Result Value   Chloride 96 (*)    CO2 33 (*)    Glucose, Bld 148 (*)    All other components within normal limits  CBC - Abnormal; Notable for the following components:   WBC 17.6 (*)    Hemoglobin 12.5 (*)    MCV 78.7 (*)    MCH 23.4 (*)    MCHC 29.7 (*)    RDW 20.7 (*)    All other components within normal limits  I-STAT VENOUS BLOOD GAS, ED - Abnormal; Notable for the following components:   pCO2, Ven  71.8 (*)    Bicarbonate 36.1 (*)    TCO2 38 (*)    Acid-Base Excess 7.0 (*)    All other components within normal limits  TROPONIN T, HIGH SENSITIVITY - Abnormal; Notable for the following components:   Troponin T High Sensitivity 22 (*)    All other components within normal limits  RESP PANEL BY RT-PCR (RSV, FLU A&B, COVID)  RVPGX2  PRO BRAIN NATRIURETIC PEPTIDE  TROPONIN T, HIGH SENSITIVITY   ____________________________________________  EKG   EKG Interpretation Date/Time:  Monday September 27 2024 08:23:32 EDT Ventricular Rate:  100 PR Interval:  129 QRS Duration:  90 QT Interval:  312 QTC Calculation: 403 R Axis:   72  Text Interpretation: Sinus tachycardia Borderline repolarization abnormality Baseline wander in lead(s) V5 Confirmed by Darra Chew [phone]) on 09/27/2024 8:31:12 AM        ____________________________________________  RADIOLOGY  DG Chest Portable 1 View Result Date: 09/27/2024 CLINICAL DATA:  Shortness of breath. EXAM: PORTABLE CHEST 1 VIEW COMPARISON:  09/08/2024 and CT chest 05/29/2023. FINDINGS: Trachea is midline. Heart size normal. Left atrial occlusion device. Lungs are hyperinflated. There may be subtle interstitial prominence in lingula and left lower lobe. No dense airspace consolidation or pleural fluid. IMPRESSION: Question subtle interstitial prominence in the lingula and left lower lobe, raising suspicion for an infectious bronchiolitis. Electronically Signed   By: Newell Eke M.D.   On: 09/27/2024 09:01    ____________________________________________   PROCEDURES  Procedure(s) performed:   Procedures  CRITICAL CARE Performed by: Chew KANDICE Darra Total critical care time: 35 minutes Critical care time was exclusive of separately billable procedures and treating other patients. Critical care was necessary to treat or prevent imminent or life-threatening deterioration. Critical care was time spent personally by me on the following  activities: development of treatment plan with patient and/or surrogate as well as nursing, discussions with consultants, evaluation of patient's response to treatment, examination of patient, obtaining history from patient or surrogate, ordering and performing treatments and interventions, ordering and review of laboratory studies, ordering and review of radiographic studies, pulse oximetry and re-evaluation of patient's condition.  Chew Darra, MD Emergency Medicine  ____________________________________________   INITIAL IMPRESSION / ASSESSMENT AND PLAN / ED COURSE  Pertinent labs & imaging results that were available during my care of the patient were reviewed by me and considered in my medical decision making (see chart for details).   This patient is Presenting for Evaluation of SOB, which does require a range of treatment options, and is a complaint that involves a high risk of morbidity and mortality.  The Differential Diagnoses include COPD exacerbation, CHF, PE, ACS, CAP, COVID, etc.  Critical Interventions-    Medications  albuterol  (PROVENTIL ) (2.5 MG/3ML) 0.083% nebulizer solution 2.5 mg (has no administration in time  range)  ipratropium-albuterol  (DUONEB) 0.5-2.5 (3) MG/3ML nebulizer solution 3 mL (has no administration in time range)  ipratropium-albuterol  (DUONEB) 0.5-2.5 (3) MG/3ML nebulizer solution 3 mL (3 mLs Nebulization Given 09/27/24 0833)  ipratropium-albuterol  (DUONEB) 0.5-2.5 (3) MG/3ML nebulizer solution 3 mL (3 mLs Nebulization Given 09/27/24 0820)  methylPREDNISolone  sodium succinate  (SOLU-MEDROL ) 125 mg/2 mL injection 125 mg (125 mg Intravenous Given 09/27/24 0818)    Reassessment after intervention:  symptoms improved.    I did obtain Additional Historical Information from wife at bedside.   I decided to review pertinent External Data, and in summary patient with frequent ED presentations for similar.   Clinical Laboratory Tests Ordered, included CBC with  leukocytosis to 17.6.  No acute kidney injury.  Minimally elevated troponin at 37 but not uptrending.  VBG shows mild respiratory acidosis with CO2 of 71. COVID negative.   Radiologic Tests Ordered, included CXR. I independently interpreted the images and agree with radiology interpretation.   Cardiac Monitor Tracing which shows sinus tachycardia.    Social Determinants of Health Risk patient is not an active smoker.   Consult complete with [facility], Dr. Claudene. Plan for admit.   Medical Decision Making: Summary:  The patient presents the emergency department evaluation of shortness of breath.  He is wheezing throughout.  Presentation seems most consistent with COPD exacerbation.  Considered alternate etiologies as above.  He is on increased O2 from his baseline need of only at night.  Plan for steroid and back-to-back DuoNebs with reassessment.  Reevaluation with update and discussion with patient.  Plan for observation admission to continue nebulizer treatments and steroids.  Both he and his wife at bedside are in agreement. Continue to wean O2 as able.   Patient's presentation is most consistent with acute presentation with potential threat to life or bodily function.   Disposition: admit  ____________________________________________  FINAL CLINICAL IMPRESSION(S) / ED DIAGNOSES  Final diagnoses:  Acute on chronic respiratory failure with hypoxia (HCC)  COPD exacerbation (HCC)    Note:  This document was prepared using Dragon voice recognition software and may include unintentional dictation errors.  Fonda Law, MD, [facility] Emergency Medicine    Iolani Twilley, Fonda MATSU, MD 09/27/24 [phone]

## 2024-09-27 NOTE — ED Notes (Signed)
 ED Provider at bedside.

## 2024-09-27 NOTE — H&P (Signed)
 History and Physical    Paul [name] FMW:[id number] DOB: 08-Mar-1946 DOA: 09/27/2024  PCP: [name], MD   Patient coming from: Home   Chief Complaint: SOB, productive cough  HPI: Paul [name] is a 78 y.o. male with medical history significant for COPD, OSA, chronic respiratory failure, chronic HFpEF, hypertension, type 2 diabetes mellitus, PAF with Watchman device, hypothyroidism, and anxiety who presents with worsening shortness of breath and cough.  Patient complains of worsening shortness of breath and worsening cough with green sputum production over the past several days.  He denies any associated fever, chills, leg swelling, or chest pain.  He typically only uses supplemental oxygen  at night but has been using it around-the-clock recently.  Despite this, he reports oxygen  saturations as low as the 70s at home.  [facility] ED Course: Upon arrival to the ED, patient is found to be afebrile and saturating 94% on 2 L/min supplemental oxygen  with tachypnea, tachycardia, and stable BP.  Chest x-ray features a subtle interstitial prominence involving the lingula and left lower lobe.  Labs are most notable for serum CO2 33, normal creatinine, WBC 17,600, normal BNP, and pCO2 of 72 with normal pH.  Patient was treated with 125 mg Solu-Medrol , DuoNeb x 3, and albuterol  x 3.  He was transferred to [facility] for admission.  Review of Systems:  All other systems reviewed and apart from HPI, are negative.  Past Medical History:  Diagnosis Date   Anemia    Aortic atherosclerosis    Aspiration pneumonia due to inhalation of vomitus (HCC) 02/03/2022   Atrial fibrillation (HCC)    Back pain    COPD (chronic obstructive pulmonary disease) (HCC)    COPD exacerbation (HCC) 12/27/2021   COVID-19 12/24/2020   Diabetes mellitus without complication (HCC)    Dizziness 04/08/2022   Elevated PSA    GAD (generalized anxiety disorder)    GERD (gastroesophageal reflux disease)    High  cholesterol    Hypertension    Lingular pneumonia 01/05/2021   See cxr 01/06/20 rx with zpak/ cefipime x 5 days and then developed purulent sputum again 01/12/21 so rx levcaquin 500 mg daily x 7 days then return for cxr before more    Lung nodule < 6cm on CT 05/29/2016   Osteoporosis    Oxygen  deficiency    Pneumonia    January 2022   Presence of Watchman left atrial appendage closure device 08/02/2021   s/p LAAO by Dr. Cindie with a 20 mm Watchman FLX device   RSV bronchitis 01/27/2022   Vertebral compression fracture (HCC) 05/29/2019   T8 compression fracture noted on CT scan 05/28/2019 inpatient with chronic steroid dependent COPD - rx calcitonin nasal spray rx per PCP     Past Surgical History:  Procedure Laterality Date   CATARACT EXTRACTION Right    ELBOW SURGERY  07/2022   surgery on olecranon bursa. Dr. Alyse   IR KYPHO EA ADDL LEVEL THORACIC OR LUMBAR  11/28/2021   LEFT ATRIAL APPENDAGE OCCLUSION N/A 08/02/2021   Procedure: LEFT ATRIAL APPENDAGE OCCLUSION;  Surgeon: Wonda Sharper, MD;  Location: [facility] INVASIVE CV LAB;  Service: Cardiovascular;  Laterality: N/A;   SKIN SURGERY     shoulders and chest   TEE WITHOUT CARDIOVERSION N/A 08/02/2021   Procedure: TRANSESOPHAGEAL ECHOCARDIOGRAM (TEE);  Surgeon: Wonda Sharper, MD;  Location: [facility] INVASIVE CV LAB;  Service: Cardiovascular;  Laterality: N/A;   TEE WITHOUT CARDIOVERSION N/A 09/13/2021   Procedure: TRANSESOPHAGEAL ECHOCARDIOGRAM (TEE);  Surgeon: Croitoru,  Jerel, MD;  Location: [facility] ENDOSCOPY;  Service: Cardiovascular;  Laterality: N/A;    Social History:   reports that he quit smoking about 15 years ago. His smoking use included cigarettes. He started smoking about 67 years ago. He has a 104 pack-year smoking history. He has never used smokeless tobacco. He reports that he does not currently use alcohol. He reports that he does not use drugs.  Allergies  Allergen Reactions   Tape Other (See Comments)    PLEASE USE COBAN  WRAP IN LIEU OF TAPE, as it pulls off the skin!!   Daliresp  [Roflumilast ] Diarrhea and Nausea And Vomiting   Levaquin  [Levofloxacin ] Palpitations and Other (See Comments)    Made the B/P fluctuate and heart raced   Alendronate Anxiety and Other (See Comments)    Jittery/nervous    Family History  Problem Relation Age of Onset   Heart disease Brother    Heart disease Mother    Heart disease Father    Cancer Paternal Uncle      Prior to Admission medications   Medication Sig Start Date End Date Taking? Authorizing Provider  Accu-Chek Softclix Lancets lancets Check fasting sugar once daily. 08/05/23   CoxAbigail, MD  acetaminophen  (TYLENOL ) 500 MG tablet Take 1,000 mg by mouth every 8 (eight) hours as needed for mild pain (pain score 1-3) (or headaches).    [provider]  albuterol  (PROAIR  HFA) 108 (90 Base) MCG/ACT inhaler Inhale 2 puffs into the lungs every 6 (six) hours as needed for wheezing or shortness of breath.    [provider]  albuterol  (PROVENTIL ) (2.5 MG/3ML) 0.083% nebulizer solution Take 3 mLs (2.5 mg total) by nebulization every 6 (six) hours as needed for shortness of breath or wheezing. 10/23/22   Parrett, Madelin RAMAN, NP  ALPRAZolam  (XANAX ) 0.5 MG tablet Take 1 tablet (0.5 mg total) by mouth 2 (two) times daily as needed for anxiety. F41.1 09/24/24   Nicholaus Credit, PA-C  amLODipine  (NORVASC ) 5 MG tablet Take 1 tablet (5 mg total) by mouth daily. 09/12/24   Fausto Sor A, DO  Ascorbic Acid  (VITAMIN C ) 1000 MG tablet Take 1,000 mg by mouth daily.    [provider]  aspirin  EC 81 MG tablet Take 1 tablet (81 mg total) by mouth daily. Swallow whole. 01/28/22   McDaniel, Jill D, NP  atorvastatin  (LIPITOR) 40 MG tablet TAKE 1 TABLET BY MOUTH EVERYDAY AT BEDTIME Patient taking differently: Take 40 mg by mouth at bedtime. 07/22/24   [name], MD  blood glucose meter kit and supplies KIT Dispense based on patient and insurance preference. Use up to  four times daily as directed. 05/06/23   CoxAbigail, MD  Blood Glucose Monitoring Suppl DEVI Use to test blood sugar up to twice daily. May substitute to any manufacturer covered by patient's insurance. E11.0 07/14/23   CoxAbigail, MD  budesonide -glycopyrrolate -formoterol  (BREZTRI  AEROSPHERE) 160-9-4.8 MCG/ACT AERO inhaler Inhale 2 puffs into the lungs 2 (two) times daily. 07/06/24   CoxAbigail, MD  budesonide -glycopyrrolate -formoterol  (BREZTRI  AEROSPHERE) 160-9-4.8 MCG/ACT AERO inhaler Inhale 2 puffs into the lungs 2 (two) times daily. 09/16/24   Teressa Harrie HERO, FNP  Cholecalciferol  (VITAMIN D3) 50 MCG (2000 UT) TABS Take 2,000 Units by mouth in the morning.    [provider]  dapagliflozin  propanediol (FARXIGA ) 10 MG TABS tablet Take 1 tablet (10 mg total) by mouth daily. 07/06/24   CoxAbigail, MD  denosumab  (PROLIA ) 60 MG/ML SOSY injection Inject 60 mg into  the skin every 6 (six) months. 03/11/23   Cox, [name], MD  digoxin  (LANOXIN ) 0.125 MG tablet TAKE 1 TABLET BY MOUTH DAILY 04/26/24   Jeffrie Oneil BROCKS, MD  ferrous sulfate  325 (65 FE) MG tablet Take 325 mg by mouth 2 (two) times daily with a meal. Patient taking differently: Take 325 mg by mouth daily with breakfast.    [provider]  fluticasone  (FLONASE ) 50 MCG/ACT nasal spray Place 2 sprays into both nostrils daily as needed for allergies or rhinitis.    [provider]  furosemide  (LASIX ) 40 MG tablet Take 1 tablet (40 mg total) by mouth daily. 09/12/24   Fausto Sor A, DO  Glucose Blood (BLOOD GLUCOSE TEST STRIPS) STRP Use to test blood sugar up to twice daily. May substitute to any manufacturer covered by patient's insurance. E11.0 07/14/23   CoxAbigail, MD  Guaifenesin  (MUCINEX  MAXIMUM STRENGTH) 1200 MG TB12 Take 1,200 mg by mouth in the morning and at bedtime.    [provider]  ipratropium-albuterol  (DUONEB) 0.5-2.5 (3) MG/3ML SOLN Take 3 mLs by nebulization 2 (two) times daily for 10 days.  09/11/24 09/26/24  Fausto Sor LABOR, DO  Lancet Device MISC Use to test blood sugar up to twice daily. May substitute to any manufacturer covered by patient's insurance. E11.0 07/14/23   [name], MD  Lancets Misc. MISC Use to test blood sugar up to twice daily. May substitute to any manufacturer covered by patient's insurance. E11.0 07/14/23   CoxAbigail, MD  levothyroxine  (SYNTHROID ) 25 MCG tablet Take 1 tablet (25 mcg total) by mouth daily. Patient taking differently: Take 25 mcg by mouth daily before breakfast. 05/21/24   Cox, Kirsten, MD  loratadine  (CLARITIN ) 10 MG tablet Take 10 mg by mouth in the morning.    [provider]  Melatonin 12 MG TABS Take 12 mg by mouth at bedtime.    [provider]  Multiple Vitamin (MULTIVITAMIN WITH MINERALS) TABS tablet Take 1 tablet by mouth daily with breakfast.    [provider]  NON FORMULARY Take 1 capsule by mouth See admin instructions. Triple Strength Omega 3 Fish Oil 1200 mg  EPA & DHA capsules - Take 1 capsule by mouth at bedtime    [provider]  NUCALA  100 MG/ML SOSY Inject 100 mg into the skin every 30 (thirty) days.    [provider]  OXYGEN  Inhale 2-2.5 L/min into the lungs See admin instructions. 2-2.5 L/min at bedtime and as needed during the day and/or with  exercise/activity- for shortness of breath    [provider]  pantoprazole  (PROTONIX ) 40 MG tablet Take 1 tablet (40 mg total) by mouth 2 (two) times daily. Patient taking differently: Take 40 mg by mouth 2 (two) times daily before a meal. 05/20/24   Parrett, Tammy S, NP  predniSONE  (DELTASONE ) 10 MG tablet TAKE 1 TABLET (10 MG TOTAL) BY MOUTH DAILY WITH BREAKFAST. Patient not taking: Reported on 09/16/2024 04/27/24   Parrett, Madelin RAMAN, NP  Probiotic Product (PROBIOTIC DAILY) CAPS Take 1 capsule by mouth in the morning.    [provider]  sertraline  (ZOLOFT ) 50 MG tablet TAKE 1 TABLET BY MOUTH EVERYDAY AT BEDTIME Patient  taking differently: Take 50 mg by mouth at bedtime. 04/21/24   [name], MD    Physical Exam: Vitals:   09/27/24 1730 09/27/24 1745 09/27/24 1853 09/27/24 1919  BP: (!) 158/72 (!) 158/72 (!) 151/62 (!) 143/73  Pulse: 97 97 99 90  Resp:  18 19 20 18   Temp:  98.3 F (36.8 C) 98.2 F (36.8 C) 98.3 F (36.8 C)  TempSrc:  Oral Oral Oral  SpO2: 97% 97% 96% 97%  Weight:      Height:        Constitutional: NAD, no pallor or diaphoresis   Eyes: PERTLA, lids and conjunctivae normal ENMT: Mucous membranes are moist. Posterior pharynx clear of any exudate or lesions.   Neck: supple, no masses  Respiratory: Diminished breath sounds bilaterally. Prolonged expiratory phase. Dyspneic with speech.   Cardiovascular: S1 & S2 heard, regular rate and rhythm. No extremity edema.  Abdomen: No tenderness, soft. Bowel sounds active.  Musculoskeletal: no clubbing / cyanosis. S/p partial left finger amputations.   Skin: no significant rashes, lesions, ulcers. Warm, dry, well-perfused. Neurologic: CN 2-12 grossly intact. Moving all extremities. Alert and oriented.  Psychiatric: Calm. Cooperative.    Labs and Imaging on Admission: I have personally reviewed following labs and imaging studies  CBC: Recent Labs  Lab 09/27/24 0807 09/27/24 0828  WBC 17.6*  --   HGB 12.5* 14.3  HCT 42.1 42.0  MCV 78.7*  --   PLT 288  --    Basic Metabolic Panel: Recent Labs  Lab 09/27/24 0807 09/27/24 0828  NA 142 138  K 3.9 3.9  CL 96*  --   CO2 33*  --   GLUCOSE 148*  --   BUN 20  --   CREATININE 0.97  --   CALCIUM  10.2  --    GFR: Estimated Creatinine Clearance: 53.7 mL/min (by C-G formula based on SCr of 0.97 mg/dL). Liver Function Tests: No results for input(s): AST, ALT, ALKPHOS, BILITOT, PROT, ALBUMIN  in the last 168 hours. No results for input(s): LIPASE, AMYLASE in the last 168 hours. No results for input(s): AMMONIA in the last 168 hours. Coagulation Profile: No results  for input(s): INR, PROTIME in the last 168 hours. Cardiac Enzymes: No results for input(s): CKTOTAL, CKMB, CKMBINDEX, TROPONINI in the last 168 hours. BNP (last 3 results) Recent Labs    08/26/24 1529 09/07/24 0502 09/27/24 0807  PROBNP 250.0 259.0 172.0   HbA1C: No results for input(s): HGBA1C in the last 72 hours. CBG: No results for input(s): GLUCAP in the last 168 hours. Lipid Profile: No results for input(s): CHOL, HDL, LDLCALC, TRIG, CHOLHDL, LDLDIRECT in the last 72 hours. Thyroid  Function Tests: No results for input(s): TSH, T4TOTAL, FREET4, T3FREE, THYROIDAB in the last 72 hours. Anemia Panel: No results for input(s): VITAMINB12, FOLATE, FERRITIN, TIBC, IRON, RETICCTPCT in the last 72 hours. Urine analysis:    Component Value Date/Time   COLORURINE YELLOW 08/20/2023 1401   APPEARANCEUR CLEAR 08/20/2023 1401   LABSPEC <=1.005 08/20/2023 1401   PHURINE 5.5 08/20/2023 1401   GLUCOSEU >=500 (A) 08/20/2023 1401   HGBUR NEGATIVE 08/20/2023 1401   BILIRUBINUR NEGATIVE 08/20/2023 1401   BILIRUBINUR Negative 05/10/2021 0931   KETONESUR NEGATIVE 08/20/2023 1401   PROTEINUR NEGATIVE 08/20/2023 1401   UROBILINOGEN negative (A) 05/10/2021 0931   NITRITE NEGATIVE 08/20/2023 1401   LEUKOCYTESUR NEGATIVE 08/20/2023 1401   Sepsis Labs: @LABRCNTIP (procalcitonin:4,lacticidven:4) ) Recent Results (from the past 240 hours)  Resp panel by RT-PCR (RSV, Flu A&B, Covid) Anterior Nasal Swab     Status: None   Collection Time: 09/27/24  8:18 AM   Specimen: Anterior Nasal Swab  Result Value Ref Range Status   SARS Coronavirus 2 by RT PCR NEGATIVE NEGATIVE Final    Comment: (NOTE) SARS-CoV-2 target nucleic acids are  NOT DETECTED.  The SARS-CoV-2 RNA is generally detectable in upper respiratory specimens during the acute phase of infection. The lowest concentration of SARS-CoV-2 viral copies this assay can detect is 138 copies/mL. A  negative result does not preclude SARS-Cov-2 infection and should not be used as the sole basis for treatment or other patient management decisions. A negative result may occur with  improper specimen collection/handling, submission of specimen other than nasopharyngeal swab, presence of viral mutation(s) within the areas targeted by this assay, and inadequate number of viral copies(<138 copies/mL). A negative result must be combined with clinical observations, patient history, and epidemiological information. The expected result is Negative.  Fact Sheet for Patients:  BloggerCourse.com  Fact Sheet for Healthcare Providers:  SeriousBroker.it  This test is no t yet approved or cleared by the [place]  FDA and  has been authorized for detection and/or diagnosis of SARS-CoV-2 by FDA under an Emergency Use Authorization (EUA). This EUA will remain  in effect (meaning this test can be used) for the duration of the COVID-19 declaration under Section 564(b)(1) of the Act, 21 U.S.C.section 360bbb-3(b)(1), unless the authorization is terminated  or revoked sooner.       Influenza A by PCR NEGATIVE NEGATIVE Final   Influenza B by PCR NEGATIVE NEGATIVE Final    Comment: (NOTE) The Xpert Xpress SARS-CoV-2/FLU/RSV plus assay is intended as an aid in the diagnosis of influenza from Nasopharyngeal swab specimens and should not be used as a sole basis for treatment. Nasal washings and aspirates are unacceptable for Xpert Xpress SARS-CoV-2/FLU/RSV testing.  Fact Sheet for Patients: BloggerCourse.com  Fact Sheet for Healthcare Providers: SeriousBroker.it  This test is not yet approved or cleared by the [place]  FDA and has been authorized for detection and/or diagnosis of SARS-CoV-2 by FDA under an Emergency Use Authorization (EUA). This EUA will remain in effect (meaning this test can  be used) for the duration of the COVID-19 declaration under Section 564(b)(1) of the Act, 21 U.S.C. section 360bbb-3(b)(1), unless the authorization is terminated or revoked.     Resp Syncytial Virus by PCR NEGATIVE NEGATIVE Final    Comment: (NOTE) Fact Sheet for Patients: BloggerCourse.com  Fact Sheet for Healthcare Providers: SeriousBroker.it  This test is not yet approved or cleared by the [place]  FDA and has been authorized for detection and/or diagnosis of SARS-CoV-2 by FDA under an Emergency Use Authorization (EUA). This EUA will remain in effect (meaning this test can be used) for the duration of the COVID-19 declaration under Section 564(b)(1) of the Act, 21 U.S.C. section 360bbb-3(b)(1), unless the authorization is terminated or revoked.  Performed at [facility], [address] [place], [place] [address]      Radiological Exams on Admission: DG Chest Portable 1 View Result Date: 09/27/2024 CLINICAL DATA:  Shortness of breath. EXAM: PORTABLE CHEST 1 VIEW COMPARISON:  09/08/2024 and CT chest 05/29/2023. FINDINGS: Trachea is midline. Heart size normal. Left atrial occlusion device. Lungs are hyperinflated. There may be subtle interstitial prominence in lingula and left lower lobe. No dense airspace consolidation or pleural fluid. IMPRESSION: Question subtle interstitial prominence in the lingula and left lower lobe, raising suspicion for an infectious bronchiolitis. Electronically Signed   By: Newell Eke M.D.   On: 09/27/2024 09:01    EKG: Independently reviewed. Sinu tachycardia, rate 100.   Assessment/Plan   1. COPD exacerbation; acute on chronic hypoxic & hypercapneic respiratory failure  - Culture sputum, continue systemic steroid and short-acting bronchodilators, start azithromycin ,  continue supplemental O2 as-needed   2. Chronic HFpEF  - Appears compensated  - Continue Lasix    3. PAF  -  S/p placement of Watchman device  - Continue digoxin     4. Type II DM  - A1c was 6.3% in June 2025  - Check CBGs and use low-intensity SSI for now    5. Hypertension  - Norvasc     6. OSA  - CPAP while sleeping   7. GAD  - Continue Zoloft  and as-needed Xanax     8. Hypothyroidism  - Synthroid     DVT prophylaxis: Lovenox   Code Status: Full  Level of Care: Level of care: Progressive Family Communication: None present  Disposition Plan:  Patient is from: Home  Anticipated d/c is to: Home  Anticipated d/c date is: 09/30/24  Patient currently: pending improved respiratory status Consults called: None Admission status: Inpatient     Evalene GORMAN Sprinkles, MD [facility]  09/27/2024, 8:49 PM

## 2024-09-28 DIAGNOSIS — J441 Chronic obstructive pulmonary disease with (acute) exacerbation: Secondary | ICD-10-CM | POA: Diagnosis not present

## 2024-09-28 LAB — CBC
HCT: 39.4 % (ref 39.0–52.0)
Hemoglobin: 11.8 g/dL — ABNORMAL LOW (ref 13.0–17.0)
MCH: 23.2 pg — ABNORMAL LOW (ref 26.0–34.0)
MCHC: 29.9 g/dL — ABNORMAL LOW (ref 30.0–36.0)
MCV: 77.6 fL — ABNORMAL LOW (ref 80.0–100.0)
Platelets: 251 K/uL (ref 150–400)
RBC: 5.08 MIL/uL (ref 4.22–5.81)
RDW: 20 % — ABNORMAL HIGH (ref 11.5–15.5)
WBC: 12.3 K/uL — ABNORMAL HIGH (ref 4.0–10.5)
nRBC: 0 % (ref 0.0–0.2)

## 2024-09-28 LAB — BASIC METABOLIC PANEL WITH GFR
Anion gap: 9 (ref 5–15)
BUN: 25 mg/dL — ABNORMAL HIGH (ref 8–23)
CO2: 35 mmol/L — ABNORMAL HIGH (ref 22–32)
Calcium: 9.7 mg/dL (ref 8.9–10.3)
Chloride: 97 mmol/L — ABNORMAL LOW (ref 98–111)
Creatinine, Ser: 1.27 mg/dL — ABNORMAL HIGH (ref 0.61–1.24)
GFR, Estimated: 57 mL/min — ABNORMAL LOW (ref >60)
Glucose, Bld: 175 mg/dL — ABNORMAL HIGH (ref 70–99)
Potassium: 5.2 mmol/L — ABNORMAL HIGH (ref 3.5–5.1)
Sodium: 141 mmol/L (ref 135–145)

## 2024-09-28 LAB — DIGOXIN LEVEL: Digoxin Level: 1 ng/mL (ref 0.8–2.0)

## 2024-09-28 LAB — GLUCOSE, CAPILLARY
Glucose-Capillary: 168 mg/dL — ABNORMAL HIGH (ref 70–99)
Glucose-Capillary: 163 mg/dL — ABNORMAL HIGH (ref 70–99)
Glucose-Capillary: 221 mg/dL — ABNORMAL HIGH (ref 70–99)
Glucose-Capillary: 275 mg/dL — ABNORMAL HIGH (ref 70–99)

## 2024-09-28 MED ORDER — BUDESONIDE 0.25 MG/2ML IN SUSP: 0.2500 mg | Freq: Two times a day (BID) | RESPIRATORY_TRACT | Status: DC

## 2024-09-28 MED ORDER — IPRATROPIUM-ALBUTEROL 0.5-2.5 (3) MG/3ML IN SOLN
3.0000 mL | Freq: Four times a day (QID) | RESPIRATORY_TRACT | Status: DC
  Administered 2024-09-28 – 2024-09-30 (×7): 3 mL via RESPIRATORY_TRACT
  Filled 2024-09-28 (×7): qty 3

## 2024-09-28 MED ORDER — BUDESONIDE 0.25 MG/2ML IN SUSP
0.2500 mg | Freq: Two times a day (BID) | RESPIRATORY_TRACT | Status: DC
  Administered 2024-09-28 – 2024-09-30 (×4): 0.25 mg via RESPIRATORY_TRACT
  Filled 2024-09-28 (×4): qty 2

## 2024-09-28 NOTE — Progress Notes (Signed)
 RT assessed patient. Patient stated that he will have a family member bring his CPAP.

## 2024-09-28 NOTE — Progress Notes (Signed)
 PROGRESS NOTE    Paul [name]  FMW:[id number] DOB: 13-Jan-1946 DOA: 09/27/2024 PCP: [name], MD   Brief Narrative:   78 y.o. male with medical history significant for COPD, OSA, chronic respiratory failure, chronic HFpEF, hypertension, type 2 diabetes mellitus, PAF with Watchman device, hypothyroidism, and anxiety who presents with worsening shortness of breath and cough. He is currently being treated for COPD exacerbation. He is from home and lives with wife.   Assessment & Plan:  Principal Problem:   COPD exacerbation (HCC) Active Problems:   Acute on chronic respiratory failure with hypoxia and hypercapnia (HCC)   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HCC)   Paroxysmal atrial fibrillation (HCC)   Chronic diastolic CHF (congestive heart failure) (HCC)   GAD (generalized anxiety disorder)   OSA (obstructive sleep apnea)   Hypothyroidism    COPD exacerbation; acute on chronic hypoxic & hypercapneic respiratory failure  - Continue with po steroids, azithromycin  and inhalational bronchodilator therapy -Outpatient f/u with pulmonology -Needs before discharge -Incentive spirotmery -He uses 2 L oxygen  at night and prn on exertion.     Chronic HFpEF, POA:  - Appears compensated  - Continue Lasix     PAF  - S/p placement of Watchman device  - Continue digoxin      Type II DM  - A1c was 6.3% in June 2025  - Check CBGs and use low-intensity SSI for now     Hypertension  - On Norvasc      OSA  - CPAP while sleeping    GAD  - Continue Zoloft  and as-needed Xanax      Hypothyroidism  - Synthroid    Disposition: Home with wife.     DVT prophylaxis: enoxaparin  (LOVENOX ) injection 40 mg Start: 09/27/24 2115     Code Status: Full Code Family Communication:  None at the bedside Status is: Inpatient Remains inpatient appropriate because: COPD exacerbation    Subjective:  He still feel short of breath. He quit smoking many years back and does follow up  with a pulmonologist. He does use duoneb every 6 hours at home. He told me that his 64 years old nephew died yesterday and his son has been diagnosed with brain cancer.  Examination:  General exam: Appears calm and comfortable  Respiratory system: b/l expiratory wheezing Cardiovascular system: S1 & S2 heard, RRR. No JVD, murmurs, rubs, gallops or clicks. No pedal edema. Gastrointestinal system: Abdomen is nondistended, soft and nontender. No organomegaly or masses felt. Normal bowel sounds heard. Central nervous system: Alert and oriented. No focal neurological deficits. Extremities: Symmetric 5 x 5 power. Skin: No rashes, lesions or ulcers Psychiatry: Judgement and insight appear normal. Mood & affect appropriate.       Diet Orders (From admission, onward)     Start     Ordered   09/27/24 2017  Diet regular Room service appropriate? Yes; Fluid consistency: Thin  Diet effective now       Question Answer Comment  Room service appropriate? Yes   Fluid consistency: Thin      09/27/24 2017            Objective: Vitals:   09/28/24 0418 09/28/24 0730 09/28/24 0808 09/28/24 0852  BP: (!) 148/66 (!) 141/62    Pulse: 86 93 96 86  Resp: 17 (!) 25 18   Temp: 98.3 F (36.8 C) 98.1 F (36.7 C)    TempSrc: Oral Oral    SpO2: 97% 94% 100%   Weight:  Height:        Intake/Output Summary (Last 24 hours) at 09/28/2024 1018 Last data filed at 09/28/2024 0900 Gross per 24 hour  Intake 600 ml  Output 500 ml  Net 100 ml   Filed Weights   09/27/24 0804  Weight: 72 kg    Scheduled Meds:  amLODipine   5 mg Oral Daily   aspirin  EC  81 mg Oral Daily   atorvastatin   40 mg Oral QHS   azithromycin   500 mg Oral Daily   budesonide -glycopyrrolate -formoterol   2 puff Inhalation BID   digoxin   125 mcg Oral Daily   enoxaparin  (LOVENOX ) injection  40 mg Subcutaneous Q24H   furosemide   40 mg Oral Daily   insulin  aspart  0-5 Units Subcutaneous QHS   insulin  aspart  0-6 Units  Subcutaneous TID WC   levothyroxine   25 mcg Oral QAC breakfast   melatonin  12 mg Oral QHS   pantoprazole   40 mg Oral BID AC   [START ON 09/29/2024] predniSONE   40 mg Oral Q breakfast   sertraline   50 mg Oral QHS   sodium chloride  flush  3 mL Intravenous Q12H   Continuous Infusions:  Nutritional status     Body mass index is 26.41 kg/m.  Data Reviewed:   CBC: Recent Labs  Lab 09/27/24 0807 09/27/24 0828 09/28/24 0338  WBC 17.6*  --  12.3*  HGB 12.5* 14.3 11.8*  HCT 42.1 42.0 39.4  MCV 78.7*  --  77.6*  PLT 288  --  251   Basic Metabolic Panel: Recent Labs  Lab 09/27/24 0807 09/27/24 0828 09/28/24 0338  NA 142 138 141  K 3.9 3.9 5.2*  CL 96*  --  97*  CO2 33*  --  35*  GLUCOSE 148*  --  175*  BUN 20  --  25*  CREATININE 0.97  --  1.27*  CALCIUM  10.2  --  9.7   GFR: Estimated Creatinine Clearance: 41 mL/min (A) (by C-G formula based on SCr of 1.27 mg/dL (H)). Liver Function Tests: No results for input(s): AST, ALT, ALKPHOS, BILITOT, PROT, ALBUMIN  in the last 168 hours. No results for input(s): LIPASE, AMYLASE in the last 168 hours. No results for input(s): AMMONIA in the last 168 hours. Coagulation Profile: No results for input(s): INR, PROTIME in the last 168 hours. Cardiac Enzymes: No results for input(s): CKTOTAL, CKMB, CKMBINDEX, TROPONINI in the last 168 hours. BNP (last 3 results) Recent Labs    08/26/24 1529 09/07/24 0502 09/27/24 0807  PROBNP 250.0 259.0 172.0   HbA1C: No results for input(s): HGBA1C in the last 72 hours. CBG: Recent Labs  Lab 09/27/24 2123 09/28/24 0624  GLUCAP 256* 168*   Lipid Profile: No results for input(s): CHOL, HDL, LDLCALC, TRIG, CHOLHDL, LDLDIRECT in the last 72 hours. Thyroid  Function Tests: No results for input(s): TSH, T4TOTAL, FREET4, T3FREE, THYROIDAB in the last 72 hours. Anemia Panel: No results for input(s): VITAMINB12, FOLATE, FERRITIN,  TIBC, IRON, RETICCTPCT in the last 72 hours. Sepsis Labs: No results for input(s): PROCALCITON, LATICACIDVEN in the last 168 hours.  Recent Results (from the past 240 hours)  Resp panel by RT-PCR (RSV, Flu A&B, Covid) Anterior Nasal Swab     Status: None   Collection Time: 09/27/24  8:18 AM   Specimen: Anterior Nasal Swab  Result Value Ref Range Status   SARS Coronavirus 2 by RT PCR NEGATIVE NEGATIVE Final    Comment: (NOTE) SARS-CoV-2 target nucleic acids are NOT DETECTED.  The SARS-CoV-2 RNA  is generally detectable in upper respiratory specimens during the acute phase of infection. The lowest concentration of SARS-CoV-2 viral copies this assay can detect is 138 copies/mL. A negative result does not preclude SARS-Cov-2 infection and should not be used as the sole basis for treatment or other patient management decisions. A negative result may occur with  improper specimen collection/handling, submission of specimen other than nasopharyngeal swab, presence of viral mutation(s) within the areas targeted by this assay, and inadequate number of viral copies(<138 copies/mL). A negative result must be combined with clinical observations, patient history, and epidemiological information. The expected result is Negative.  Fact Sheet for Patients:  BloggerCourse.com  Fact Sheet for Healthcare Providers:  SeriousBroker.it  This test is no t yet approved or cleared by the [place]  FDA and  has been authorized for detection and/or diagnosis of SARS-CoV-2 by FDA under an Emergency Use Authorization (EUA). This EUA will remain  in effect (meaning this test can be used) for the duration of the COVID-19 declaration under Section 564(b)(1) of the Act, 21 U.S.C.section 360bbb-3(b)(1), unless the authorization is terminated  or revoked sooner.       Influenza A by PCR NEGATIVE NEGATIVE Final   Influenza B by PCR NEGATIVE  NEGATIVE Final    Comment: (NOTE) The Xpert Xpress SARS-CoV-2/FLU/RSV plus assay is intended as an aid in the diagnosis of influenza from Nasopharyngeal swab specimens and should not be used as a sole basis for treatment. Nasal washings and aspirates are unacceptable for Xpert Xpress SARS-CoV-2/FLU/RSV testing.  Fact Sheet for Patients: BloggerCourse.com  Fact Sheet for Healthcare Providers: SeriousBroker.it  This test is not yet approved or cleared by the [place]  FDA and has been authorized for detection and/or diagnosis of SARS-CoV-2 by FDA under an Emergency Use Authorization (EUA). This EUA will remain in effect (meaning this test can be used) for the duration of the COVID-19 declaration under Section 564(b)(1) of the Act, 21 U.S.C. section 360bbb-3(b)(1), unless the authorization is terminated or revoked.     Resp Syncytial Virus by PCR NEGATIVE NEGATIVE Final    Comment: (NOTE) Fact Sheet for Patients: BloggerCourse.com  Fact Sheet for Healthcare Providers: SeriousBroker.it  This test is not yet approved or cleared by the [place]  FDA and has been authorized for detection and/or diagnosis of SARS-CoV-2 by FDA under an Emergency Use Authorization (EUA). This EUA will remain in effect (meaning this test can be used) for the duration of the COVID-19 declaration under Section 564(b)(1) of the Act, 21 U.S.C. section 360bbb-3(b)(1), unless the authorization is terminated or revoked.  Performed at [facility], [address], [place], [place] [address]          Radiology Studies: DG Chest Portable 1 View Result Date: 09/27/2024 CLINICAL DATA:  Shortness of breath. EXAM: PORTABLE CHEST 1 VIEW COMPARISON:  09/08/2024 and CT chest 05/29/2023. FINDINGS: Trachea is midline. Heart size normal. Left atrial occlusion device. Lungs are hyperinflated. There  may be subtle interstitial prominence in lingula and left lower lobe. No dense airspace consolidation or pleural fluid. IMPRESSION: Question subtle interstitial prominence in the lingula and left lower lobe, raising suspicion for an infectious bronchiolitis. Electronically Signed   By: Newell Eke M.D.   On: 09/27/2024 09:01           LOS: 1 day   Time spent= 39 mins    Deliliah Room, MD [facility]  If 7PM-7AM, please contact night-coverage  09/28/2024, 10:18 AM

## 2024-09-28 NOTE — Plan of Care (Signed)
  Problem: Education: Goal: Knowledge of General Education information will improve Description: Including pain rating scale, medication(s)/side effects and non-pharmacologic comfort measures Outcome: Progressing   Problem: Health Behavior/Discharge Planning: Goal: Ability to manage health-related needs will improve Outcome: Progressing   Problem: Clinical Measurements: Goal: Ability to maintain clinical measurements within normal limits will improve Outcome: Progressing Goal: Will remain free from infection Outcome: Progressing Goal: Diagnostic test results will improve Outcome: Progressing Goal: Respiratory complications will improve Outcome: Progressing Goal: Cardiovascular complication will be avoided Outcome: Progressing   Problem: Activity: Goal: Risk for activity intolerance will decrease Outcome: Progressing   Problem: Nutrition: Goal: Adequate nutrition will be maintained Outcome: Progressing   Problem: Coping: Goal: Level of anxiety will decrease Outcome: Progressing   Problem: Elimination: Goal: Will not experience complications related to bowel motility Outcome: Progressing Goal: Will not experience complications related to urinary retention Outcome: Progressing   Problem: Pain Managment: Goal: General experience of comfort will improve and/or be controlled Outcome: Progressing   Problem: Safety: Goal: Ability to remain free from injury will improve Outcome: Progressing   Problem: Education: Goal: Knowledge of disease or condition will improve Outcome: Progressing Goal: Knowledge of the prescribed therapeutic regimen will improve Outcome: Progressing Goal: Individualized Educational Video(s) Outcome: Progressing   Problem: Activity: Goal: Ability to tolerate increased activity will improve Outcome: Progressing Goal: Will verbalize the importance of balancing activity with adequate rest periods Outcome: Progressing   Problem: Respiratory: Goal:  Ability to maintain a clear airway will improve Outcome: Progressing Goal: Levels of oxygenation will improve Outcome: Progressing Goal: Ability to maintain adequate ventilation will improve Outcome: Progressing   Problem: Education: Goal: Ability to describe self-care measures that may prevent or decrease complications (Diabetes Survival Skills Education) will improve Outcome: Progressing Goal: Individualized Educational Video(s) Outcome: Progressing   Problem: Coping: Goal: Ability to adjust to condition or change in health will improve Outcome: Progressing   Problem: Fluid Volume: Goal: Ability to maintain a balanced intake and output will improve Outcome: Progressing   Problem: Health Behavior/Discharge Planning: Goal: Ability to identify and utilize available resources and services will improve Outcome: Progressing Goal: Ability to manage health-related needs will improve Outcome: Progressing   Problem: Metabolic: Goal: Ability to maintain appropriate glucose levels will improve Outcome: Progressing   Problem: Nutritional: Goal: Maintenance of adequate nutrition will improve Outcome: Progressing Goal: Progress toward achieving an optimal weight will improve Outcome: Progressing   Problem: Skin Integrity: Goal: Risk for impaired skin integrity will decrease Outcome: Progressing   Problem: Tissue Perfusion: Goal: Adequacy of tissue perfusion will improve Outcome: Progressing

## 2024-09-29 DIAGNOSIS — J441 Chronic obstructive pulmonary disease with (acute) exacerbation: Secondary | ICD-10-CM | POA: Diagnosis not present

## 2024-09-29 LAB — BASIC METABOLIC PANEL WITH GFR
Anion gap: 10 (ref 5–15)
BUN: 33 mg/dL — ABNORMAL HIGH (ref 8–23)
CO2: 33 mmol/L — ABNORMAL HIGH (ref 22–32)
Calcium: 9.2 mg/dL (ref 8.9–10.3)
Chloride: 96 mmol/L — ABNORMAL LOW (ref 98–111)
Creatinine, Ser: 1.18 mg/dL (ref 0.61–1.24)
GFR, Estimated: >60 mL/min (ref >60)
Glucose, Bld: 89 mg/dL (ref 70–99)
Potassium: 4 mmol/L (ref 3.5–5.1)
Sodium: 139 mmol/L (ref 135–145)

## 2024-09-29 LAB — CBC
HCT: 35.5 % — ABNORMAL LOW (ref 39.0–52.0)
Hemoglobin: 10.8 g/dL — ABNORMAL LOW (ref 13.0–17.0)
MCH: 23.4 pg — ABNORMAL LOW (ref 26.0–34.0)
MCHC: 30.4 g/dL (ref 30.0–36.0)
MCV: 76.8 fL — ABNORMAL LOW (ref 80.0–100.0)
Platelets: 252 K/uL (ref 150–400)
RBC: 4.62 MIL/uL (ref 4.22–5.81)
RDW: 19.8 % — ABNORMAL HIGH (ref 11.5–15.5)
WBC: 16.4 K/uL — ABNORMAL HIGH (ref 4.0–10.5)
nRBC: 0 % (ref 0.0–0.2)

## 2024-09-29 LAB — GLUCOSE, CAPILLARY
Glucose-Capillary: 110 mg/dL — ABNORMAL HIGH (ref 70–99)
Glucose-Capillary: 134 mg/dL — ABNORMAL HIGH (ref 70–99)
Glucose-Capillary: 242 mg/dL — ABNORMAL HIGH (ref 70–99)
Glucose-Capillary: 187 mg/dL — ABNORMAL HIGH (ref 70–99)

## 2024-09-29 NOTE — Progress Notes (Signed)
 Mobility Specialist Progress Note:   09/29/24 1200  Mobility  Activity Ambulated with assistance  Level of Assistance Standby assist, set-up cues, supervision of patient - no hands on  Assistive Device None  Distance Ambulated (ft) 450 ft  Activity Response Tolerated well  Mobility Referral Yes  Mobility visit 1 Mobility  Mobility Specialist Start Time (ACUTE ONLY) 1200  Mobility Specialist Stop Time (ACUTE ONLY) 1211  Mobility Specialist Time Calculation (min) (ACUTE ONLY) 11 min   Pt agreeable to mobility session. Required no physical assistance to ambulate. 2LO2 needed to maintain SpO2 WFL. Pt back in chair with all needs met.   Therisa Rana Mobility Specialist Please contact via SecureChat or  Rehab office at [phone]

## 2024-09-29 NOTE — Progress Notes (Signed)
 Nurse requested Mobility Specialist to perform oxygen  saturation test with pt which includes removing pt from oxygen  both at rest and while ambulating.  Below are the results from that testing.     Patient Saturations on Room Air at Rest = spO2 94%  Patient Saturations on Room Air while Ambulating = sp02 84% .  Patient Saturations on 2 Liters of oxygen  while Ambulating = sp02 90%  At end of testing pt left in room on 2  Liters of oxygen .  Reported results to nurse.   Therisa Rana Mobility Specialist Please contact via SecureChat or  Rehab office at [phone]

## 2024-09-29 NOTE — Progress Notes (Addendum)
 PROGRESS NOTE    Paul [name]  FMW:[id number] DOB: November 11, 1946 DOA: 09/27/2024 PCP: [name], MD   Brief Narrative:   78 y.o. male with medical history significant for COPD, OSA, chronic respiratory failure, chronic HFpEF, hypertension, type 2 diabetes mellitus, PAF with Watchman device, hypothyroidism, and anxiety who presents with worsening shortness of breath and cough. He is currently being treated for COPD exacerbation. He is from home and lives with wife.   Assessment & Plan:  Principal Problem:   COPD exacerbation (HCC) Active Problems:   Acute on chronic respiratory failure with hypoxia and hypercapnia (HCC)   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HCC)   Paroxysmal atrial fibrillation (HCC)   Chronic diastolic CHF (congestive heart failure) (HCC)   GAD (generalized anxiety disorder)   OSA (obstructive sleep apnea)   Hypothyroidism    Gold III COPD exacerbation; acute on chronic hypoxic & hypercapneic respiratory failure. Improving symptoms - Continue with po steroids, azithromycin  and inhalational bronchodilator therapy -Outpatient f/u with pulmonology -Ordered -Incentive spirotmery -He uses 2 L oxygen  at night and prn on exertion.  -He is on monthly Nucala  injections (to reduce dependence on steroids). His second injection is supposed to be administered tomorrow at his pulmonologist's office.    Chronic HFpEF, POA:  - Appears compensated  - Continue Lasix     PAF  - S/p placement of Watchman device  - Continue digoxin      Type II DM  - A1c was 6.3% in June 2025  - Check CBGs and use low-intensity SSI for now     Hypertension  - On Norvasc      OSA  - CPAP while sleeping    GAD  - Continue Zoloft  and as-needed Xanax      Hypothyroidism  - Synthroid    Disposition: Home with wife.     DVT prophylaxis: enoxaparin  (LOVENOX ) injection 40 mg Start: 09/27/24 2115     Code Status: Full Code Family Communication:  None at the bedside Status  is: Inpatient Remains inpatient appropriate because: COPD exacerbation    Subjective:  He feels better and is not requiring supplemental oxygen  at rest. He will be given a . He is supposed to get his second Nucala  injection tomorrow.   Examination:  General exam: Appears calm and comfortable  Respiratory system: b/l expiratory wheezing Cardiovascular system: S1 & S2 heard, RRR. No JVD, murmurs, rubs, gallops or clicks. No pedal edema. Gastrointestinal system: Abdomen is nondistended, soft and nontender. No organomegaly or masses felt. Normal bowel sounds heard. Central nervous system: Alert and oriented. No focal neurological deficits. Extremities: Symmetric 5 x 5 power. Skin: No rashes, lesions or ulcers Psychiatry: Judgement and insight appear normal. Mood & affect appropriate.       Diet Orders (From admission, onward)     Start     Ordered   09/27/24 2017  Diet regular Room service appropriate? Yes; Fluid consistency: Thin  Diet effective now       Question Answer Comment  Room service appropriate? Yes   Fluid consistency: Thin      09/27/24 2017            Objective: Vitals:   09/29/24 0422 09/29/24 0747 09/29/24 0834 09/29/24 0916  BP: 136/66 (!) 141/63  (!) 141/63  Pulse: 88 82 79 79  Resp: 18 18 (!) 21   Temp: 98 F (36.7 C) 97.6 F (36.4 C)    TempSrc: Oral Oral    SpO2: 99% 98% 98%   Weight:  71.8 kg     Height:        Intake/Output Summary (Last 24 hours) at 09/29/2024 0946 Last data filed at 09/29/2024 0942 Gross per 24 hour  Intake 723 ml  Output --  Net 723 ml   Filed Weights   09/27/24 0804 09/29/24 0422  Weight: 72 kg 71.8 kg    Scheduled Meds:  amLODipine   5 mg Oral Daily   aspirin  EC  81 mg Oral Daily   atorvastatin   40 mg Oral QHS   budesonide  (PULMICORT ) nebulizer solution  0.25 mg Nebulization BID   digoxin   125 mcg Oral Daily   enoxaparin  (LOVENOX ) injection  40 mg Subcutaneous Q24H   furosemide   40 mg Oral Daily    insulin  aspart  0-5 Units Subcutaneous QHS   insulin  aspart  0-6 Units Subcutaneous TID WC   ipratropium-albuterol   3 mL Nebulization Q6H   levothyroxine   25 mcg Oral QAC breakfast   melatonin  12 mg Oral QHS   pantoprazole   40 mg Oral BID AC   predniSONE   40 mg Oral Q breakfast   sertraline   50 mg Oral QHS   sodium chloride  flush  3 mL Intravenous Q12H   Continuous Infusions:  Nutritional status     Body mass index is 26.34 kg/m.  Data Reviewed:   CBC: Recent Labs  Lab 09/27/24 0807 09/27/24 0828 09/28/24 0338 09/29/24 0318  WBC 17.6*  --  12.3* 16.4*  HGB 12.5* 14.3 11.8* 10.8*  HCT 42.1 42.0 39.4 35.5*  MCV 78.7*  --  77.6* 76.8*  PLT 288  --  251 252   Basic Metabolic Panel: Recent Labs  Lab 09/27/24 0807 09/27/24 0828 09/28/24 0338 09/29/24 0318  NA 142 138 141 139  K 3.9 3.9 5.2* 4.0  CL 96*  --  97* 96*  CO2 33*  --  35* 33*  GLUCOSE 148*  --  175* 89  BUN 20  --  25* 33*  CREATININE 0.97  --  1.27* 1.18  CALCIUM  10.2  --  9.7 9.2   GFR: Estimated Creatinine Clearance: 44.2 mL/min (by C-G formula based on SCr of 1.18 mg/dL). Liver Function Tests: No results for input(s): AST, ALT, ALKPHOS, BILITOT, PROT, ALBUMIN  in the last 168 hours. No results for input(s): LIPASE, AMYLASE in the last 168 hours. No results for input(s): AMMONIA in the last 168 hours. Coagulation Profile: No results for input(s): INR, PROTIME in the last 168 hours. Cardiac Enzymes: No results for input(s): CKTOTAL, CKMB, CKMBINDEX, TROPONINI in the last 168 hours. BNP (last 3 results) Recent Labs    08/26/24 1529 09/07/24 0502 09/27/24 0807  PROBNP 250.0 259.0 172.0   HbA1C: No results for input(s): HGBA1C in the last 72 hours. CBG: Recent Labs  Lab 09/28/24 0624 09/28/24 1145 09/28/24 1541 09/28/24 2124 09/29/24 0608  GLUCAP 168* 163* 221* 275* 110*   Lipid Profile: No results for input(s): CHOL, HDL, LDLCALC, TRIG,  CHOLHDL, LDLDIRECT in the last 72 hours. Thyroid  Function Tests: No results for input(s): TSH, T4TOTAL, FREET4, T3FREE, THYROIDAB in the last 72 hours. Anemia Panel: No results for input(s): VITAMINB12, FOLATE, FERRITIN, TIBC, IRON, RETICCTPCT in the last 72 hours. Sepsis Labs: No results for input(s): PROCALCITON, LATICACIDVEN in the last 168 hours.  Recent Results (from the past 240 hours)  Resp panel by RT-PCR (RSV, Flu A&B, Covid) Anterior Nasal Swab     Status: None   Collection Time: 09/27/24  8:18 AM   Specimen: Anterior Nasal Swab  Result Value Ref Range Status   SARS Coronavirus 2 by RT PCR NEGATIVE NEGATIVE Final    Comment: (NOTE) SARS-CoV-2 target nucleic acids are NOT DETECTED.  The SARS-CoV-2 RNA is generally detectable in upper respiratory specimens during the acute phase of infection. The lowest concentration of SARS-CoV-2 viral copies this assay can detect is 138 copies/mL. A negative result does not preclude SARS-Cov-2 infection and should not be used as the sole basis for treatment or other patient management decisions. A negative result may occur with  improper specimen collection/handling, submission of specimen other than nasopharyngeal swab, presence of viral mutation(s) within the areas targeted by this assay, and inadequate number of viral copies(<138 copies/mL). A negative result must be combined with clinical observations, patient history, and epidemiological information. The expected result is Negative.  Fact Sheet for Patients:  BloggerCourse.com  Fact Sheet for Healthcare Providers:  SeriousBroker.it  This test is no t yet approved or cleared by the [place]  FDA and  has been authorized for detection and/or diagnosis of SARS-CoV-2 by FDA under an Emergency Use Authorization (EUA). This EUA will remain  in effect (meaning this test can be used) for the duration of  the COVID-19 declaration under Section 564(b)(1) of the Act, 21 U.S.C.section 360bbb-3(b)(1), unless the authorization is terminated  or revoked sooner.       Influenza A by PCR NEGATIVE NEGATIVE Final   Influenza B by PCR NEGATIVE NEGATIVE Final    Comment: (NOTE) The Xpert Xpress SARS-CoV-2/FLU/RSV plus assay is intended as an aid in the diagnosis of influenza from Nasopharyngeal swab specimens and should not be used as a sole basis for treatment. Nasal washings and aspirates are unacceptable for Xpert Xpress SARS-CoV-2/FLU/RSV testing.  Fact Sheet for Patients: BloggerCourse.com  Fact Sheet for Healthcare Providers: SeriousBroker.it  This test is not yet approved or cleared by the [place]  FDA and has been authorized for detection and/or diagnosis of SARS-CoV-2 by FDA under an Emergency Use Authorization (EUA). This EUA will remain in effect (meaning this test can be used) for the duration of the COVID-19 declaration under Section 564(b)(1) of the Act, 21 U.S.C. section 360bbb-3(b)(1), unless the authorization is terminated or revoked.     Resp Syncytial Virus by PCR NEGATIVE NEGATIVE Final    Comment: (NOTE) Fact Sheet for Patients: BloggerCourse.com  Fact Sheet for Healthcare Providers: SeriousBroker.it  This test is not yet approved or cleared by the [place]  FDA and has been authorized for detection and/or diagnosis of SARS-CoV-2 by FDA under an Emergency Use Authorization (EUA). This EUA will remain in effect (meaning this test can be used) for the duration of the COVID-19 declaration under Section 564(b)(1) of the Act, 21 U.S.C. section 360bbb-3(b)(1), unless the authorization is terminated or revoked.  Performed at [facility], [address], [place], [place] [address]          Radiology Studies: No results found.       LOS:  2 days   Time spent= 39 mins    Deliliah Room, MD [facility]  If 7PM-7AM, please contact night-coverage  09/29/2024, 9:46 AM

## 2024-09-29 NOTE — Telephone Encounter (Signed)
 Received provider portion of application

## 2024-09-30 ENCOUNTER — Ambulatory Visit

## 2024-09-30 DIAGNOSIS — J441 Chronic obstructive pulmonary disease with (acute) exacerbation: Secondary | ICD-10-CM | POA: Diagnosis not present

## 2024-09-30 LAB — BASIC METABOLIC PANEL WITH GFR
Anion gap: 10 (ref 5–15)
BUN: 32 mg/dL — ABNORMAL HIGH (ref 8–23)
CO2: 33 mmol/L — ABNORMAL HIGH (ref 22–32)
Calcium: 9 mg/dL (ref 8.9–10.3)
Chloride: 97 mmol/L — ABNORMAL LOW (ref 98–111)
Creatinine, Ser: 1.11 mg/dL (ref 0.61–1.24)
GFR, Estimated: >60 mL/min (ref >60)
Glucose, Bld: 136 mg/dL — ABNORMAL HIGH (ref 70–99)
Potassium: 5 mmol/L (ref 3.5–5.1)
Sodium: 140 mmol/L (ref 135–145)

## 2024-09-30 LAB — CBC
HCT: 35.7 % — ABNORMAL LOW (ref 39.0–52.0)
Hemoglobin: 10.6 g/dL — ABNORMAL LOW (ref 13.0–17.0)
MCH: 23 pg — ABNORMAL LOW (ref 26.0–34.0)
MCHC: 29.7 g/dL — ABNORMAL LOW (ref 30.0–36.0)
MCV: 77.4 fL — ABNORMAL LOW (ref 80.0–100.0)
Platelets: 228 K/uL (ref 150–400)
RBC: 4.61 MIL/uL (ref 4.22–5.81)
RDW: 19.1 % — ABNORMAL HIGH (ref 11.5–15.5)
WBC: 10.9 K/uL — ABNORMAL HIGH (ref 4.0–10.5)
nRBC: 0 % (ref 0.0–0.2)

## 2024-09-30 LAB — GLUCOSE, CAPILLARY: Glucose-Capillary: 106 mg/dL — ABNORMAL HIGH (ref 70–99)

## 2024-09-30 MED ORDER — AZITHROMYCIN 250 MG PO TABS
ORAL_TABLET | ORAL | 0 refills | Status: AC
Start: 2024-09-30 — End: 2024-10-05

## 2024-09-30 MED ORDER — PREDNISONE 20 MG PO TABS: 40.0000 mg | ORAL_TABLET | Freq: Every day | ORAL | 0 refills | Status: DC

## 2024-09-30 NOTE — Plan of Care (Signed)
  Problem: Education: Goal: Knowledge of General Education information will improve Description: Including pain rating scale, medication(s)/side effects and non-pharmacologic comfort measures Outcome: Progressing   Problem: Health Behavior/Discharge Planning: Goal: Ability to manage health-related needs will improve Outcome: Progressing   Problem: Clinical Measurements: Goal: Ability to maintain clinical measurements within normal limits will improve Outcome: Progressing Goal: Will remain free from infection Outcome: Progressing Goal: Diagnostic test results will improve Outcome: Progressing Goal: Respiratory complications will improve Outcome: Progressing Goal: Cardiovascular complication will be avoided Outcome: Progressing   Problem: Activity: Goal: Risk for activity intolerance will decrease Outcome: Progressing   Problem: Nutrition: Goal: Adequate nutrition will be maintained Outcome: Progressing   Problem: Coping: Goal: Level of anxiety will decrease Outcome: Progressing   Problem: Elimination: Goal: Will not experience complications related to bowel motility Outcome: Progressing Goal: Will not experience complications related to urinary retention Outcome: Progressing   Problem: Pain Managment: Goal: General experience of comfort will improve and/or be controlled Outcome: Progressing   Problem: Safety: Goal: Ability to remain free from injury will improve Outcome: Progressing   Problem: Education: Goal: Knowledge of disease or condition will improve Outcome: Progressing Goal: Knowledge of the prescribed therapeutic regimen will improve Outcome: Progressing Goal: Individualized Educational Video(s) Outcome: Progressing   Problem: Activity: Goal: Ability to tolerate increased activity will improve Outcome: Progressing Goal: Will verbalize the importance of balancing activity with adequate rest periods Outcome: Progressing   Problem: Respiratory: Goal:  Ability to maintain a clear airway will improve Outcome: Progressing Goal: Levels of oxygenation will improve Outcome: Progressing Goal: Ability to maintain adequate ventilation will improve Outcome: Progressing   Problem: Education: Goal: Ability to describe self-care measures that may prevent or decrease complications (Diabetes Survival Skills Education) will improve Outcome: Progressing Goal: Individualized Educational Video(s) Outcome: Progressing   Problem: Coping: Goal: Ability to adjust to condition or change in health will improve Outcome: Progressing   Problem: Fluid Volume: Goal: Ability to maintain a balanced intake and output will improve Outcome: Progressing   Problem: Health Behavior/Discharge Planning: Goal: Ability to identify and utilize available resources and services will improve Outcome: Progressing Goal: Ability to manage health-related needs will improve Outcome: Progressing   Problem: Metabolic: Goal: Ability to maintain appropriate glucose levels will improve Outcome: Progressing   Problem: Nutritional: Goal: Maintenance of adequate nutrition will improve Outcome: Progressing Goal: Progress toward achieving an optimal weight will improve Outcome: Progressing   Problem: Skin Integrity: Goal: Risk for impaired skin integrity will decrease Outcome: Progressing   Problem: Tissue Perfusion: Goal: Adequacy of tissue perfusion will improve Outcome: Progressing

## 2024-09-30 NOTE — Plan of Care (Signed)
  Problem: Education: Goal: Knowledge of General Education information will improve Description: Including pain rating scale, medication(s)/side effects and non-pharmacologic comfort measures Outcome: Adequate for Discharge   Problem: Health Behavior/Discharge Planning: Goal: Ability to manage health-related needs will improve Outcome: Adequate for Discharge   Problem: Clinical Measurements: Goal: Ability to maintain clinical measurements within normal limits will improve Outcome: Adequate for Discharge Goal: Will remain free from infection Outcome: Adequate for Discharge Goal: Diagnostic test results will improve Outcome: Adequate for Discharge Goal: Respiratory complications will improve Outcome: Adequate for Discharge Goal: Cardiovascular complication will be avoided Outcome: Adequate for Discharge   Problem: Activity: Goal: Risk for activity intolerance will decrease Outcome: Adequate for Discharge   Problem: Nutrition: Goal: Adequate nutrition will be maintained Outcome: Adequate for Discharge   Problem: Coping: Goal: Level of anxiety will decrease Outcome: Adequate for Discharge   Problem: Elimination: Goal: Will not experience complications related to bowel motility Outcome: Adequate for Discharge Goal: Will not experience complications related to urinary retention Outcome: Adequate for Discharge   Problem: Pain Managment: Goal: General experience of comfort will improve and/or be controlled Outcome: Adequate for Discharge   Problem: Safety: Goal: Ability to remain free from injury will improve Outcome: Adequate for Discharge   Problem: Education: Goal: Knowledge of disease or condition will improve Outcome: Adequate for Discharge Goal: Knowledge of the prescribed therapeutic regimen will improve Outcome: Adequate for Discharge Goal: Individualized Educational Video(s) Outcome: Adequate for Discharge   Problem: Activity: Goal: Ability to tolerate  increased activity will improve Outcome: Adequate for Discharge Goal: Will verbalize the importance of balancing activity with adequate rest periods Outcome: Adequate for Discharge   Problem: Respiratory: Goal: Ability to maintain a clear airway will improve Outcome: Adequate for Discharge Goal: Levels of oxygenation will improve Outcome: Adequate for Discharge Goal: Ability to maintain adequate ventilation will improve Outcome: Adequate for Discharge   Problem: Education: Goal: Ability to describe self-care measures that may prevent or decrease complications (Diabetes Survival Skills Education) will improve Outcome: Adequate for Discharge Goal: Individualized Educational Video(s) Outcome: Adequate for Discharge   Problem: Coping: Goal: Ability to adjust to condition or change in health will improve Outcome: Adequate for Discharge   Problem: Fluid Volume: Goal: Ability to maintain a balanced intake and output will improve Outcome: Adequate for Discharge   Problem: Health Behavior/Discharge Planning: Goal: Ability to identify and utilize available resources and services will improve Outcome: Adequate for Discharge Goal: Ability to manage health-related needs will improve Outcome: Adequate for Discharge   Problem: Metabolic: Goal: Ability to maintain appropriate glucose levels will improve Outcome: Adequate for Discharge   Problem: Nutritional: Goal: Maintenance of adequate nutrition will improve Outcome: Adequate for Discharge Goal: Progress toward achieving an optimal weight will improve Outcome: Adequate for Discharge   Problem: Skin Integrity: Goal: Risk for impaired skin integrity will decrease Outcome: Adequate for Discharge   Problem: Tissue Perfusion: Goal: Adequacy of tissue perfusion will improve Outcome: Adequate for Discharge

## 2024-09-30 NOTE — Discharge Summary (Addendum)
 Physician Discharge Summary   Patient: Paul [name] MRN: [id number] DOB: 10-11-1946  Admit date:     09/27/2024  Discharge date: 09/30/24  Discharge Physician: Deliliah Room   PCP: [name], MD   Recommendations at discharge:    F/u with your PCP in one week  F/u with pulmonology on your scheduled appointment  Discharge Diagnoses: Principal Problem:   COPD exacerbation [facility]) Active Problems:   Acute on chronic respiratory failure with hypoxia and hypercapnia (HCC)   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HCC)   Paroxysmal atrial fibrillation (HCC)   Chronic diastolic CHF (congestive heart failure) (HCC)   GAD (generalized anxiety disorder)   OSA (obstructive sleep apnea)   Hypothyroidism   Hospital Course:  78 y.o. male with medical history significant for COPD, OSA, chronic respiratory failure, chronic HFpEF, hypertension, type 2 diabetes mellitus, PAF with Watchman device, hypothyroidism, and anxiety who presents with worsening shortness of breath and cough. He was treated for COPD exacerbation. Symptoms resolved with corticosteroids and inhalational bronchodilator therapy.  He is from home and lives with wife.  He has an appointment on 10/10 to get his second Nucala  injections Dced on short course of prednisone  and azithromycin . done on 10/8 and patient needs 2 L oxygen  on ambulation only. He already has oxygen  at home through [employer].      Consultants: None Procedures performed: None  Disposition: Home Diet recommendation:  Cardiac diet DISCHARGE MEDICATION: Allergies as of 09/30/2024       Reactions   Tape Other (See Comments)   PLEASE USE COBAN WRAP IN LIEU OF TAPE, as it pulls off the skin!!   Daliresp  [roflumilast ] Diarrhea, Nausea And Vomiting   Levaquin  [levofloxacin ] Palpitations, Other (See Comments)   Made the B/P fluctuate and heart raced   Alendronate Anxiety, Other (See Comments)   Jittery/nervous        Medication List      TAKE these medications    Accu-Chek Softclix Lancets lancets Check fasting sugar once daily.   ALPRAZolam  0.5 MG tablet Commonly known as: XANAX  Take 1 tablet (0.5 mg total) by mouth 2 (two) times daily as needed for anxiety. F41.1 What changed: when to take this   amLODipine  5 MG tablet Commonly known as: NORVASC  Take 1 tablet (5 mg total) by mouth daily.   aspirin  EC 81 MG tablet Take 1 tablet (81 mg total) by mouth daily. Swallow whole.   atorvastatin  40 MG tablet Commonly known as: LIPITOR TAKE 1 TABLET BY MOUTH EVERYDAY AT BEDTIME What changed: See the new instructions.   azithromycin  250 MG tablet Commonly known as: Zithromax  Z-Pak Take 2 tablets (500 mg) on  Day 1,  followed by 1 tablet (250 mg) once daily on Days 2 through 5.   BLOOD GLUCOSE TEST STRIPS Strp Use to test blood sugar up to twice daily. May substitute to any manufacturer covered by patient's insurance. E11.0   Breztri  Aerosphere 160-9-4.8 MCG/ACT Aero inhaler Generic drug: budesonide -glycopyrrolate -formoterol  Inhale 2 puffs into the lungs 2 (two) times daily.   CALCIUM  PO Take 1 tablet by mouth daily.   dapagliflozin  propanediol 10 MG Tabs tablet Commonly known as: Farxiga  Take 1 tablet (10 mg total) by mouth daily.   denosumab  60 MG/ML Sosy injection Commonly known as: PROLIA  Inject 60 mg into the skin every 6 (six) months.   digoxin  0.125 MG tablet Commonly known as: LANOXIN  TAKE 1 TABLET BY MOUTH DAILY   ferrous sulfate  325 (65 FE) MG tablet Take 325  mg by mouth 2 (two) times daily with a meal. What changed: when to take this   Fish Oil 1200 MG Caps Take 1,200 mg by mouth at bedtime.   furosemide  40 MG tablet Commonly known as: LASIX  Take 1 tablet (40 mg total) by mouth daily.   ipratropium-albuterol  0.5-2.5 (3) MG/3ML Soln Commonly known as: DUONEB Take 3 mLs by nebulization 2 (two) times daily for 10 days. What changed: when to take this   Lancets Misc. Misc Use to test  blood sugar up to twice daily. May substitute to any manufacturer covered by patient's insurance. E11.0   levothyroxine  25 MCG tablet Commonly known as: SYNTHROID  Take 1 tablet (25 mcg total) by mouth daily. What changed: when to take this   loratadine  10 MG tablet Commonly known as: CLARITIN  Take 10 mg by mouth in the morning.   Melatonin 12 MG Tabs Take 12 mg by mouth at bedtime.   Mucinex  Maximum Strength 1200 MG Tb12 Generic drug: Guaifenesin  Take 1,200 mg by mouth in the morning and at bedtime.   multivitamin with minerals Tabs tablet Take 1 tablet by mouth daily with breakfast.   Nucala  100 MG/ML Sosy Generic drug: mepolizumab  Inject 100 mg into the skin every 30 (thirty) days.   OXYGEN  Inhale 2-2.5 L/min into the lungs See admin instructions. 2-2.5 L/min at bedtime and as needed during the day and/or with  exercise/activity- for shortness of breath   pantoprazole  40 MG tablet Commonly known as: PROTONIX  Take 1 tablet (40 mg total) by mouth 2 (two) times daily. What changed: when to take this   predniSONE  20 MG tablet Commonly known as: DELTASONE  Take 2 tablets (40 mg total) by mouth daily with breakfast. Start taking on: October 01, 2024   ProAir  HFA 108 (90 Base) MCG/ACT inhaler Generic drug: albuterol  Inhale 2 puffs into the lungs every 6 (six) hours as needed for wheezing or shortness of breath. What changed: Another medication with the same name was changed. Make sure you understand how and when to take each.   albuterol  (2.5 MG/3ML) 0.083% nebulizer solution Commonly known as: PROVENTIL  Take 3 mLs (2.5 mg total) by nebulization every 6 (six) hours as needed for shortness of breath or wheezing. What changed: when to take this   Probiotic Daily Caps Take 1 capsule by mouth in the morning.   sertraline  50 MG tablet Commonly known as: ZOLOFT  TAKE 1 TABLET BY MOUTH EVERYDAY AT BEDTIME What changed: See the new instructions.   vitamin C  1000 MG  tablet Take 1,000 mg by mouth daily.   Vitamin D3 50 MCG (2000 UT) Tabs Take 2,000 Units by mouth in the morning.        Follow-up Information     Cox, Kirsten, MD. Schedule an appointment as soon as possible for a visit in 1 week(s).   Specialty: Family Medicine Contact information: [address] [place] [address] [phone]         [facility] [place]. Schedule an appointment as soon as possible for a visit in 1 month(s).   Specialty: Pulmonology Contact information: [address] [place] [address] [place]  [address] [phone] Additional information: [address]  [address]  [place], [place] [address]               Discharge Exam: Fredricka Weights   09/27/24 0804 09/29/24 0422  Weight: 72 kg 71.8 kg   Constitutional: NAD, calm, comfortable Eyes: PERRL, lids and  conjunctivae normal ENMT: Mucous membranes are moist. Posterior pharynx clear of any exudate or lesions.Normal dentition.  Neck: normal, supple, no masses, no thyromegaly Respiratory: clear to auscultation bilaterally, no wheezing, no crackles. Normal respiratory effort. No accessory muscle use.  Cardiovascular: Regular rate and rhythm, no murmurs / rubs / gallops. No extremity edema. 2+ pedal pulses. No carotid bruits.  Abdomen: no tenderness, no masses palpated. No hepatosplenomegaly. Bowel sounds positive.  Musculoskeletal: no clubbing / cyanosis. No joint deformity upper and lower extremities. Good ROM, no contractures. Normal muscle tone.  Skin: no rashes, lesions, ulcers. No induration Neurologic: CN 2-12 grossly intact. Sensation intact, DTR normal. Strength 5/5 x all 4 extremities.  Psychiatric: Normal judgment and insight. Alert and oriented x 3. Normal mood.    Condition at discharge: good  The results of significant diagnostics from this hospitalization (including imaging, microbiology, ancillary and laboratory) are listed below for  reference.   Imaging Studies: DG Chest Portable 1 View Result Date: 09/27/2024 CLINICAL DATA:  Shortness of breath. EXAM: PORTABLE CHEST 1 VIEW COMPARISON:  09/08/2024 and CT chest 05/29/2023. FINDINGS: Trachea is midline. Heart size normal. Left atrial occlusion device. Lungs are hyperinflated. There may be subtle interstitial prominence in lingula and left lower lobe. No dense airspace consolidation or pleural fluid. IMPRESSION: Question subtle interstitial prominence in the lingula and left lower lobe, raising suspicion for an infectious bronchiolitis. Electronically Signed   By: Newell Eke M.D.   On: 09/27/2024 09:01   DG CHEST PORT 1 VIEW Result Date: 09/08/2024 CLINICAL DATA:  [id number].  Shortness of breath. EXAM: PORTABLE CHEST 1 VIEW COMPARISON:  Portable chest yesterday at 8:46 a.m. FINDINGS: 5:13 a.m. the cardiac size is normal. Left atrial appendage occlusion device is again noted. No vascular congestion is seen. The mediastinum is stable. There is calcification of the transverse aorta. Both lungs are emphysematous but clear. Thoracic spondylosis with no new osseous findings. IMPRESSION: No evidence of acute chest disease. Emphysema. Aortic atherosclerosis. Stable COPD chest. Electronically Signed   By: Francis Quam M.D.   On: 09/08/2024 07:55   DG CHEST PORT 1 VIEW Result Date: 09/07/2024 EXAM: 1 VIEW XRAY OF THE CHEST 09/07/2024 08:48:25 AM COMPARISON: 09/06/2024 CLINICAL HISTORY: SOB (shortness of breath) [id number]. SOB FINDINGS: LUNGS AND PLEURA: No focal pulmonary opacity. No pulmonary edema. No pleural effusion. No pneumothorax. 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 Aortic atherosclerosis. BONES AND SOFT TISSUES: No acute osseous abnormality. IMPRESSION: 1. No acute findings. Electronically signed by: Norman Gatlin MD 09/07/2024 10:33 AM EDT RP Workstation: [id number]   DG Chest Portable 1 View Result Date: 09/06/2024 CLINICAL DATA:  Hypoxia.   Shortness of breath. EXAM: PORTABLE CHEST 1 VIEW COMPARISON:  08/26/2024 FINDINGS: Heart size is normal. Evidence for a left atrial appendage occlusion device. Atherosclerotic calcifications at the aortic arch. Trachea is midline. Lungs are clear without focal airspace disease or pulmonary edema. Negative for a pneumothorax. No acute bone abnormality. IMPRESSION: No active disease. Electronically Signed   By: Juliene Balder M.D.   On: 09/06/2024 17:54    Microbiology: Results for orders placed or performed during the hospital encounter of 09/27/24  Resp panel by RT-PCR (RSV, Flu A&B, Covid) Anterior Nasal Swab     Status: None   Collection Time: 09/27/24  8:18 AM   Specimen: Anterior Nasal Swab  Result Value Ref Range Status   SARS Coronavirus 2 by RT PCR NEGATIVE NEGATIVE Final    Comment: (NOTE) SARS-CoV-2 target nucleic acids are NOT  DETECTED.  The SARS-CoV-2 RNA is generally detectable in upper respiratory specimens during the acute phase of infection. The lowest concentration of SARS-CoV-2 viral copies this assay can detect is 138 copies/mL. A negative result does not preclude SARS-Cov-2 infection and should not be used as the sole basis for treatment or other patient management decisions. A negative result may occur with  improper specimen collection/handling, submission of specimen other than nasopharyngeal swab, presence of viral mutation(s) within the areas targeted by this assay, and inadequate number of viral copies(<138 copies/mL). A negative result must be combined with clinical observations, patient history, and epidemiological information. The expected result is Negative.  Fact Sheet for Patients:  BloggerCourse.com  Fact Sheet for Healthcare Providers:  SeriousBroker.it  This test is no t yet approved or cleared by the [place]  FDA and  has been authorized for detection and/or diagnosis of SARS-CoV-2 by FDA under an  Emergency Use Authorization (EUA). This EUA will remain  in effect (meaning this test can be used) for the duration of the COVID-19 declaration under Section 564(b)(1) of the Act, 21 U.S.C.section 360bbb-3(b)(1), unless the authorization is terminated  or revoked sooner.       Influenza A by PCR NEGATIVE NEGATIVE Final   Influenza B by PCR NEGATIVE NEGATIVE Final    Comment: (NOTE) The Xpert Xpress SARS-CoV-2/FLU/RSV plus assay is intended as an aid in the diagnosis of influenza from Nasopharyngeal swab specimens and should not be used as a sole basis for treatment. Nasal washings and aspirates are unacceptable for Xpert Xpress SARS-CoV-2/FLU/RSV testing.  Fact Sheet for Patients: BloggerCourse.com  Fact Sheet for Healthcare Providers: SeriousBroker.it  This test is not yet approved or cleared by the [place]  FDA and has been authorized for detection and/or diagnosis of SARS-CoV-2 by FDA under an Emergency Use Authorization (EUA). This EUA will remain in effect (meaning this test can be used) for the duration of the COVID-19 declaration under Section 564(b)(1) of the Act, 21 U.S.C. section 360bbb-3(b)(1), unless the authorization is terminated or revoked.     Resp Syncytial Virus by PCR NEGATIVE NEGATIVE Final    Comment: (NOTE) Fact Sheet for Patients: BloggerCourse.com  Fact Sheet for Healthcare Providers: SeriousBroker.it  This test is not yet approved or cleared by the [place]  FDA and has been authorized for detection and/or diagnosis of SARS-CoV-2 by FDA under an Emergency Use Authorization (EUA). This EUA will remain in effect (meaning this test can be used) for the duration of the COVID-19 declaration under Section 564(b)(1) of the Act, 21 U.S.C. section 360bbb-3(b)(1), unless the authorization is terminated or revoked.  Performed at [facility], [address] [place], [place] [address]     Labs: CBC: Recent Labs  Lab 09/27/24 [phone] 09/27/24 0828 09/28/24 0338 09/29/24 0318 09/30/24 0343  WBC 17.6*  --  12.3* 16.4* 10.9*  HGB 12.5* 14.3 11.8* 10.8* 10.6*  HCT 42.1 42.0 39.4 35.5* 35.7*  MCV 78.7*  --  77.6* 76.8* 77.4*  PLT 288  --  251 252 228   Basic Metabolic Panel: Recent Labs  Lab 09/27/24 0807 09/27/24 0828 09/28/24 0338 09/29/24 0318 09/30/24 0343  NA 142 138 141 139 140  K 3.9 3.9 5.2* 4.0 5.0  CL 96*  --  97* 96* 97*  CO2 33*  --  35* 33* 33*  GLUCOSE 148*  --  175* 89 136*  BUN 20  --  25* 33* 32*  CREATININE 0.97  --  1.27* 1.18 1.11  CALCIUM  10.2  --  9.7 9.2 9.0   Liver Function Tests: No results for input(s): AST, ALT, ALKPHOS, BILITOT, PROT, ALBUMIN  in the last 168 hours. CBG: Recent Labs  Lab 09/29/24 0608 09/29/24 1223 09/29/24 1527 09/29/24 2058 09/30/24 0823  GLUCAP 110* 134* 242* 187* 106*    Discharge time spent: 42 minutes.  Signed: Deliliah Room, MD [facility] 09/30/2024

## 2024-09-30 NOTE — Care Management Important Message (Signed)
 Important Message  Patient Details  Name: Paul [name] MRN: [id number] Date of Birth: [birth year]   Important Message Given:  Yes - Medicare IM     Claretta Deed 09/30/2024, 2:04 PM

## 2024-09-30 NOTE — TOC Initial Note (Signed)
 Transition of Care [facility]) - Initial/Assessment Note    Patient Details  Name: Paul [name] MRN: [id number] Date of Birth: 1946/05/08  Transition of [facility]) CM/SW Contact:    Lauraine FORBES Saa, LCSWA Phone Number: 09/30/2024, 9:55 AM  Clinical Narrative:                  9:55 AM CSW introduced self and role to patient. Patient confirmed that he resides at home with spouse who is able to provide transportation for discharge and appointments upon discharge. Patient stated that he is also able to drive self. Patient confirmed HH history with [facility]. Patient confirmed DME (oxygen , CPAP, nebulizer, RW, walker, rollator, cane, shower chair) history with [facility]. Patietn declined SNF history. Per chart review, patient has a PCP and insurance. Patient's preferred pharmacy's are [facility] and [phone] [place]. Per MD, patient needed 2L oxygen  on exertion during walk test yesterday. RNCM made aware. TOC will continue to follow.  Expected Discharge Plan: Home/Self Care Barriers to Discharge: Barriers Resolved   Patient Goals and CMS Choice Patient states their goals for this hospitalization and ongoing recovery are:: to return home          Expected Discharge Plan and Services   Discharge Planning Services: CM Consult Post Acute Care Choice: Durable Medical Equipment Living arrangements for the past 2 months: Single Family Home Expected Discharge Date: 09/30/24               DME Arranged: Oxygen  DME Agency: [facility]                  Prior Living Arrangements/Services Living arrangements for the past 2 months: Single Family Home Lives with:: Self Patient language and need for interpreter reviewed:: Yes Do you feel safe going back to the place where you live?: Yes      Need for Family Participation in Patient Care: No (Comment) Care giver support system in place?: Yes (comment) Current home services: DME Criminal Activity/Legal Involvement Pertinent to  Current Situation/Hospitalization: No - Comment as needed  Activities of Daily Living   ADL Screening (condition at time of admission) Independently performs ADLs?: Yes (appropriate for developmental age) Is the patient deaf or have difficulty hearing?: No Does the patient have difficulty seeing, even when wearing glasses/contacts?: No Does the patient have difficulty concentrating, remembering, or making decisions?: No  Permission Sought/Granted Permission sought to share information with : Family Supports Permission granted to share information with : No (Contact information on chart)  Share Information with NAME: [name] Craighead     Permission granted to share info w Relationship: Spouse  Permission granted to share info w Contact Information: [phone]  Emotional Assessment Appearance:: Appears stated age Attitude/Demeanor/Rapport: Engaged Affect (typically observed): Accepting, Pleasant, Adaptable, Stable, Appropriate, Calm Orientation: : Oriented to Self, Oriented to Place, Oriented to  Time, Oriented to Situation Alcohol / Substance Use: Not Applicable Psych Involvement: No (comment)  Admission diagnosis:  COPD exacerbation (HCC) [J44.1] Acute on chronic respiratory failure with hypoxia (HCC) [J96.21] Patient Active Problem List   Diagnosis Date Noted   COPD exacerbation (HCC) 09/27/2024   Hypothyroidism 09/27/2024   Immunization due 09/16/2024   COPD (chronic obstructive pulmonary disease) (HCC) 09/02/2024   COPD with acute exacerbation (HCC) 08/30/2024   Presence of heart assist device (HCC) 07/08/2024   Abnormal TSH 04/05/2024   Encounter for Medicare annual wellness exam 04/04/2024   Syncope and collapse 08/29/2023   Laceration of left thigh 08/29/2023  Age-related osteoporosis without current pathological fracture 07/26/2023   Secondary hypercoagulable state 04/02/2023   OSA (obstructive sleep apnea) 03/26/2023   Hypoxia 02/04/2023   Elevated d-dimer 01/25/2023    Iron 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 08/16/2022   Olecranon bursitis of right elbow 05/16/2022   Syncope 04/08/2022   GERD (gastroesophageal reflux disease) / GI protection on antiplatelet therapy 12/27/2021   Lumbar back pain 11/17/2021   Oxygen  dependent 10/09/2021   Paroxysmal atrial fibrillation (HCC) 08/02/2021   Chronic respiratory failure with hypoxia (HCC) 01/16/2021   GAD (generalized anxiety disorder) 01/14/2021   Chronic diastolic CHF (congestive heart failure) (HCC) 04/01/2020   Age-related osteoporosis with healing pathological fracture, chronic back pain 02/06/2020   Hypertension associated with diabetes (HCC) 01/28/2020   Chronic bilateral thoracic back pain 01/25/2020   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HCC) 01/25/2020   Absolute anemia 05/29/2019   Pulmonary nodules 05/29/2019   Coronary artery calcification 04/06/2019   Atherosclerosis of aorta 04/06/2019   Acute on chronic respiratory failure with hypoxia and hypercapnia (HCC)    Former cigarette smoker 12/05/2015   COPD GOLD  III     PCP:  [name], MD Pharmacy:   [phone] [place] [place] - [address] [place] [address] Phone: [phone] Fax: [phone]  [facility] [address] [place] [address] Phone: [phone] Fax: [phone]     Social Drivers of Health (SDOH) Social History: SDOH Screenings   Food Insecurity: No Food Insecurity (09/28/2024)  Housing: Low Risk  (09/28/2024)  Transportation Needs: No Transportation Needs (09/28/2024)  Utilities: Not At Risk (09/28/2024)  Alcohol Screen: Low Risk  (07/26/2024)  Depression (PHQ2-9): Low Risk  (07/26/2024)  Financial Resource Strain: Low Risk  (07/19/2023)  Physical Activity: Insufficiently Active (07/19/2023)  Social Connections: Socially Integrated (09/28/2024)  Stress: No Stress Concern Present (07/19/2023)  Tobacco Use:  Medium Risk (09/27/2024)  Health Literacy: Adequate Health Literacy (07/23/2023)   SDOH Interventions:     Readmission Risk Interventions    09/07/2024    3:38 PM 02/18/2023    3:55 PM 01/27/2023    3:27 PM  Readmission Risk Prevention Plan  Transportation Screening Complete Complete Complete  PCP or Specialist Appt within 5-7 Days Complete    Home Care Screening Complete    Medication Review (RN CM) Complete    HRI or Home Care Consult   Complete  Social Work Consult for Recovery Care Planning/Counseling   Complete  Palliative Care Screening   Not Applicable  Medication Review Oceanographer)  Referral to Pharmacy Referral to Pharmacy  HRI or Home Care Consult  Complete   SW Recovery Care/Counseling Consult  Complete   Palliative Care Screening  Complete   Skilled Nursing Facility  Not Applicable

## 2024-10-01 ENCOUNTER — Other Ambulatory Visit: Payer: Self-pay | Admitting: Adult Health

## 2024-10-01 ENCOUNTER — Ambulatory Visit

## 2024-10-01 ENCOUNTER — Telehealth: Payer: Self-pay

## 2024-10-01 VITALS — BP 179/65 | HR 84 | Temp 97.6°F | Resp 22 | Ht 65.0 in | Wt 159.6 lb

## 2024-10-01 DIAGNOSIS — J449 Chronic obstructive pulmonary disease, unspecified: Secondary | ICD-10-CM | POA: Diagnosis not present

## 2024-10-01 MED ORDER — MEPOLIZUMAB 100 MG ~~LOC~~ SOLR
100.0000 mg | Freq: Once | SUBCUTANEOUS | Status: AC
  Administered 2024-10-01: 100 mg via SUBCUTANEOUS
  Filled 2024-10-01: qty 1

## 2024-10-01 NOTE — Transitions of Care (Post Inpatient/ED Visit) (Signed)
   10/01/2024  Name: KAISER BELLUOMINI MRN: [id number] DOB: [birth year]  Today's TOC FU Call Status: Today's TOC FU Call Status:: Unsuccessful Call (1st Attempt) Unsuccessful Call (1st Attempt) Date: 10/01/24  Attempted to reach the patient regarding the most recent Inpatient/ED visit.  Follow Up Plan: Additional outreach attempts will be made to reach the patient to complete the Transitions of Care (Post Inpatient/ED visit) call.   Shona Prow RN, CCM [place]  VBCI-Population Health RN Care Manager [phone]

## 2024-10-01 NOTE — Progress Notes (Signed)
 Diagnosis: COPD  Provider:  Mannam, Praveen MD  Procedure: Injection  Nucala  (Mepolizumab ), Dose: 100 mg, Site: subcutaneous, Number of injections: 1  Injection Site(s): Left arm  Post Care: Patient declined observation  Discharge: Condition: Good, Destination: Home . AVS Declined  Performed by:  Leita FORBES Miles, LPN

## 2024-10-04 ENCOUNTER — Encounter: Payer: Self-pay | Admitting: Family Medicine

## 2024-10-04 ENCOUNTER — Telehealth: Payer: Self-pay

## 2024-10-04 ENCOUNTER — Ambulatory Visit: Admitting: Family Medicine

## 2024-10-04 VITALS — BP 144/68 | HR 89 | Temp 98.0°F | Resp 20 | Ht 65.0 in | Wt 161.6 lb

## 2024-10-04 DIAGNOSIS — J441 Chronic obstructive pulmonary disease with (acute) exacerbation: Secondary | ICD-10-CM | POA: Diagnosis not present

## 2024-10-04 DIAGNOSIS — I5032 Chronic diastolic (congestive) heart failure: Secondary | ICD-10-CM | POA: Diagnosis not present

## 2024-10-04 NOTE — Progress Notes (Signed)
 Subjective:  Patient ID: Paul [name], male    DOB: 1946-08-10  Age: 78 y.o. MRN: [id number]  Chief Complaint  Patient presents with   Hospitalization Follow-up   Medical Management of Chronic Issues   Discussed the use of AI scribe software for clinical note transcription with the patient, who gave verbal consent to proceed.  History of Present Illness   Paul [name] is a 78 year old male with anxiety and respiratory issues who presents for hospital follow-up.  Anxiety symptoms - Increased anxiety following the death of his nephew, resulting in hospitalization - Anxiety exacerbated by concerns about his son's health, though recent news about his son has been positive - Anxiety impacts his breathing  Dyspnea and hypoxemia - Breathing difficulties with oxygen  saturation dropping to the low 80s when ambulating from inside the house to the car - Oxygen  saturation remains higher when sitting - Currently requires daily supplemental oxygen , previously only used at night - Uses a spirometer and other breathing aids as part of his regimen - Follow-up with pulmonology scheduled for November 6th  Steroid and nebulizer therapy - Currently on a prednisone  taper, reduced from 40 mg to 30 mg, with plans for further gradual tapering - Took 30 mg prednisone  this morning - Uses three nebulizer treatments per day - Combination of steroid and nebulizer therapy has caused elevated blood pressure  Hematologic findings - Recent blood work during hospitalization showed a drop in hemoglobin from 14  CHF/Hypertension - elevated BP this morning - taking Amlodipine  5 mg , Lasix  40 mg and Farxiga  10 mg daily - No chest pain with some mild swelling          07/26/2024    8:34 AM 04/01/2024   10:35 AM 05/14/2023   11:23 AM 04/01/2023   11:15 AM 03/26/2023    8:54 AM  Depression screen PHQ 2/9  Decreased Interest 0 0 0 0 0  Down, Depressed, Hopeless 0 0 0 0 0  PHQ - 2 Score 0 0 0 0 0         05/20/2024    8:57 AM  Fall Risk   Falls in the past year? 0    Patient Care Team: [name], MD as PCP - General (Family Medicine) Jeffrie Oneil BROCKS, MD as PCP - Cardiology (Cardiology) Cindie Ole DASEN, MD as PCP - Electrophysiology (Cardiology) Brien Belvie FORBES, MD as Attending Physician (Pulmonary Disease) Darlean Ozell NOVAK, MD as Consulting Physician (Pulmonary Disease) [facility] as Registered Nurse [facility]iative Medicine) Carlin Delon BROCKS, NP as Nurse Practitioner (Cardiology)   Review of Systems  Constitutional:  Negative for appetite change, fatigue and fever.  HENT:  Negative for congestion, ear pain, sinus pressure and sore throat.   Eyes: Negative.   Respiratory:  Positive for shortness of breath. Negative for cough, chest tightness and wheezing.   Cardiovascular:  Negative for chest pain and palpitations.  Gastrointestinal:  Negative for abdominal pain, constipation, diarrhea, nausea and vomiting.  Endocrine: Negative.   Genitourinary:  Negative for dysuria, frequency and hematuria.  Musculoskeletal:  Negative for arthralgias, back pain, joint swelling and myalgias.  Skin:  Negative for rash.  Allergic/Immunologic: Negative.   Neurological:  Negative for dizziness, weakness, light-headedness and headaches.  Hematological: Negative.   Psychiatric/Behavioral:  Negative for dysphoric mood. The patient is nervous/anxious.     Current Outpatient Medications on File Prior to Visit  Medication Sig Dispense Refill   Accu-Chek Softclix Lancets lancets Check fasting  sugar once daily. 100 each 3   albuterol  (PROAIR  HFA) 108 (90 Base) MCG/ACT inhaler Inhale 2 puffs into the lungs every 6 (six) hours as needed for wheezing or shortness of breath.     albuterol  (PROVENTIL ) (2.5 MG/3ML) 0.083% nebulizer solution Take 3 mLs (2.5 mg total) by nebulization every 6 (six) hours as needed for shortness of breath or wheezing. (Patient taking differently: Take 2.5  mg by nebulization daily.) 360 mL 5   ALPRAZolam  (XANAX ) 0.5 MG tablet Take 1 tablet (0.5 mg total) by mouth 2 (two) times daily as needed for anxiety. F41.1 (Patient taking differently: Take 0.5 mg by mouth at bedtime. F41.1) 30 tablet 0   amLODipine  (NORVASC ) 5 MG tablet Take 1 tablet (5 mg total) by mouth daily. 30 tablet 1   Ascorbic Acid  (VITAMIN C ) 1000 MG tablet Take 1,000 mg by mouth daily.     aspirin  EC 81 MG tablet Take 1 tablet (81 mg total) by mouth daily. Swallow whole. 90 tablet 3   atorvastatin  (LIPITOR) 40 MG tablet TAKE 1 TABLET BY MOUTH EVERYDAY AT BEDTIME (Patient taking differently: Take 40 mg by mouth at bedtime.) 90 tablet 1   azithromycin  (ZITHROMAX  Z-PAK) 250 MG tablet Take 2 tablets (500 mg) on  Day 1,  followed by 1 tablet (250 mg) once daily on Days 2 through 5. 6 each 0   budesonide -glycopyrrolate -formoterol  (BREZTRI  AEROSPHERE) 160-9-4.8 MCG/ACT AERO inhaler Inhale 2 puffs into the lungs 2 (two) times daily. 3 each 4   CALCIUM  PO Take 1 tablet by mouth daily.     Cholecalciferol  (VITAMIN D3) 50 MCG (2000 UT) TABS Take 2,000 Units by mouth in the morning.     dapagliflozin  propanediol (FARXIGA ) 10 MG TABS tablet Take 1 tablet (10 mg total) by mouth daily. 90 tablet 4   denosumab  (PROLIA ) 60 MG/ML SOSY injection Inject 60 mg into the skin every 6 (six) months. 180 mL 0   digoxin  (LANOXIN ) 0.125 MG tablet TAKE 1 TABLET BY MOUTH DAILY 90 tablet 2   ferrous sulfate  325 (65 FE) MG tablet Take 325 mg by mouth 2 (two) times daily with a meal. (Patient taking differently: Take 325 mg by mouth daily with breakfast.)     furosemide  (LASIX ) 40 MG tablet Take 1 tablet (40 mg total) by mouth daily. 30 tablet 1   Glucose Blood (BLOOD GLUCOSE TEST STRIPS) STRP Use to test blood sugar up to twice daily. May substitute to any manufacturer covered by patient's insurance. E11.0 100 strip 3   Guaifenesin  (MUCINEX  MAXIMUM STRENGTH) 1200 MG TB12 Take 1,200 mg by mouth in the morning and at  bedtime.     ipratropium-albuterol  (DUONEB) 0.5-2.5 (3) MG/3ML SOLN Take 3 mLs by nebulization 2 (two) times daily for 10 days. (Patient taking differently: Take 3 mLs by nebulization daily.) 90 mL 0   Lancets Misc. MISC Use to test blood sugar up to twice daily. May substitute to any manufacturer covered by patient's insurance. E11.0 100 each 3   levothyroxine  (SYNTHROID ) 25 MCG tablet Take 1 tablet (25 mcg total) by mouth daily. (Patient taking differently: Take 25 mcg by mouth daily before breakfast.) 90 tablet 3   loratadine  (CLARITIN ) 10 MG tablet Take 10 mg by mouth in the morning.     Melatonin 12 MG TABS Take 12 mg by mouth at bedtime.     Multiple Vitamin (MULTIVITAMIN WITH MINERALS) TABS tablet Take 1 tablet by mouth daily with breakfast.     NUCALA  100 MG/ML SOSY  Inject 100 mg into the skin every 30 (thirty) days.     Omega-3 Fatty Acids  (FISH OIL) 1200 MG CAPS Take 1,200 mg by mouth at bedtime.     OXYGEN  Inhale 2-2.5 L/min into the lungs See admin instructions. 2-2.5 L/min at bedtime and as needed during the day and/or with  exercise/activity- for shortness of breath     pantoprazole  (PROTONIX ) 40 MG tablet Take 1 tablet (40 mg total) by mouth 2 (two) times daily. (Patient taking differently: Take 40 mg by mouth 2 (two) times daily before a meal.) 180 tablet 3   predniSONE  (DELTASONE ) 20 MG tablet Take 2 tablets (40 mg total) by mouth daily with breakfast. 5 tablet 0   Probiotic Product (PROBIOTIC DAILY) CAPS Take 1 capsule by mouth in the morning.     sertraline  (ZOLOFT ) 50 MG tablet TAKE 1 TABLET BY MOUTH EVERYDAY AT BEDTIME (Patient taking differently: Take 50 mg by mouth at bedtime.) 90 tablet 1   Current Facility-Administered Medications on File Prior to Visit  Medication Dose Route Frequency Provider Last Rate Last Admin   [START ON 10/30/2024] denosumab  (PROLIA ) injection 60 mg  60 mg Subcutaneous Once [name], MD       Past Medical History:  Diagnosis Date   Anemia     Aortic atherosclerosis    Aspiration pneumonia due to inhalation of vomitus (HCC) 02/03/2022   Atrial fibrillation (HCC)    Back pain    COPD (chronic obstructive pulmonary disease) (HCC)    COPD exacerbation (HCC) 12/27/2021   COVID-19 12/24/2020   Diabetes mellitus without complication (HCC)    Dizziness 04/08/2022   Elevated PSA    GAD (generalized anxiety disorder)    GERD (gastroesophageal reflux disease)    High cholesterol    Hypertension    Lingular pneumonia 01/05/2021   See cxr 01/06/20 rx with zpak/ cefipime x 5 days and then developed purulent sputum again 01/12/21 so rx levcaquin 500 mg daily x 7 days then return for cxr before more    Lung nodule < 6cm on CT 05/29/2016   Osteoporosis    Oxygen  deficiency    Pneumonia    January 2022   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 Cindie with a 20 mm Watchman FLX device   RSV bronchitis 01/27/2022   Vertebral compression fracture (HCC) 05/29/2019   T8 compression fracture noted on CT scan 05/28/2019 inpatient with chronic steroid dependent COPD - rx calcitonin nasal spray rx per PCP    Past Surgical History:  Procedure Laterality Date   CATARACT EXTRACTION Right    ELBOW SURGERY  07/2022   surgery on olecranon bursa. Dr. Alyse   IR KYPHO EA ADDL LEVEL THORACIC OR LUMBAR  11/28/2021   LEFT ATRIAL APPENDAGE OCCLUSION N/A 08/02/2021   Procedure: LEFT ATRIAL APPENDAGE OCCLUSION;  Surgeon: Wonda Sharper, MD;  Location: [facility] INVASIVE CV LAB;  Service: Cardiovascular;  Laterality: N/A;   SKIN SURGERY     shoulders and chest   TEE WITHOUT CARDIOVERSION N/A 08/02/2021   Procedure: TRANSESOPHAGEAL ECHOCARDIOGRAM (TEE);  Surgeon: Wonda Sharper, MD;  Location: [facility] INVASIVE CV LAB;  Service: Cardiovascular;  Laterality: N/A;   TEE WITHOUT CARDIOVERSION N/A 09/13/2021   Procedure: TRANSESOPHAGEAL ECHOCARDIOGRAM (TEE);  Surgeon: Francyne Headland, MD;  Location: [facility] ENDOSCOPY;  Service:  Cardiovascular;  Laterality: N/A;    Family History  Problem Relation Age of Onset   Heart disease Brother    Heart disease Mother  Heart disease Father    Cancer Paternal Uncle    Social History   Socioeconomic History   Marital status: Married    Spouse name: Not on file   Number of children: 3   Years of education: Not on file   Highest education level: 10th grade  Occupational History   Occupation: retired    Comment: truck Hospital doctor  Tobacco Use   Smoking status: Former    Current packs/day: 0.00    Average packs/day: 2.0 packs/day for 52.0 years (104.0 ttl pk-yrs)    Types: Cigarettes    Start date: 02/03/1957    Quit date: 02/03/2009    Years since quitting: 15.6   Smokeless tobacco: Never   Tobacco comments:    Counseled to remain smoke free  Vaping Use   Vaping status: Never Used  Substance and Sexual Activity   Alcohol use: Not Currently    Alcohol/week: 0.0 standard drinks of alcohol    Comment: occ   Drug use: No   Sexual activity: Not on file  Other Topics Concern   Not on file  Social History Narrative   Originally from [place]. Previously worked driving a Surveyor, minerals. No pets currently. No mold exposure. 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       wears sunscreen, brushes and flosses daily, see's dentist bi-annually, has smoke/carbon monoxide detectors, wears a seatbelt and practices gun safety      Social Drivers of Health   Financial Resource Strain: Low Risk  (07/19/2023)   Overall Financial Resource Strain (CARDIA)    Difficulty of Paying Living Expenses: Not hard at all  Food Insecurity: No Food Insecurity (09/28/2024)   Hunger Vital Sign    Worried About Running Out of Food in the Last Year: Never true    Ran Out of Food in the Last Year: Never true  Transportation Needs: No Transportation Needs (09/28/2024)   PRAPARE - Administrator, Civil Service (Medical): No     Lack of Transportation (Non-Medical): No  Physical Activity: Insufficiently Active (07/19/2023)   Exercise Vital Sign    Days of Exercise per Week: 3 days    Minutes of Exercise per Session: 30 min  Stress: No Stress Concern Present (07/19/2023)   [employer] of Occupational Health - Occupational Stress Questionnaire    Feeling of Stress : Not at all  Social Connections: Socially Integrated (09/28/2024)   Social Connection and Isolation Panel    Frequency of Communication with Friends and Family: More than three times a week    Frequency of Social Gatherings with Friends and Family: More than three times a week    Attends Religious Services: More than 4 times per year    Active Member of [employer] or Organizations: Yes    Attends Engineer, structural: More than 4 times per year    Marital Status: Married    Objective:  BP (!) 144/68   Pulse 89   Temp 98 F (36.7 C) (Temporal)   Resp 20   Ht 5' 5 (1.651 m)   Wt 161 lb 9.6 oz (73.3 kg)   SpO2 94%   BMI 26.89 kg/m      10/04/2024    9:03 AM 10/04/2024    8:38 AM 10/01/2024    8:50 AM  BP/Weight  Systolic BP 144 150 179  Diastolic BP 68 66 65  Wt. (Lbs)  161.6  BMI  26.89 kg/m2     Physical Exam Vitals reviewed.  Constitutional:      General: He is not in acute distress.    Appearance: Normal appearance.  Eyes:     Conjunctiva/sclera: Conjunctivae normal.  Cardiovascular:     Rate and Rhythm: Normal rate and regular rhythm.     Heart sounds: Normal heart sounds. No murmur heard. Pulmonary:     Effort: Pulmonary effort is normal.     Breath sounds: Decreased air movement present. Examination of the right-lower field reveals decreased breath sounds. Examination of the left-lower field reveals decreased breath sounds. Decreased breath sounds present. No wheezing or rhonchi.  Abdominal:     General: Bowel sounds are normal.     Palpations: Abdomen is soft.     Tenderness: There is no abdominal tenderness.   Musculoskeletal:        General: Normal range of motion.  Skin:    General: Skin is warm.  Neurological:     Mental Status: He is alert. Mental status is at baseline.  Psychiatric:        Mood and Affect: Mood normal.        Behavior: Behavior normal.      Lab Results  Component Value Date   WBC 10.9 (H) 09/30/2024   HGB 10.6 (L) 09/30/2024   HCT 35.7 (L) 09/30/2024   PLT 228 09/30/2024   GLUCOSE 136 (H) 09/30/2024   CHOL 158 07/05/2024   TRIG 199 (H) 07/05/2024   HDL 57 07/05/2024   LDLCALC 68 07/05/2024   ALT 33 09/09/2024   AST 22 09/09/2024   NA 140 09/30/2024   K 5.0 09/30/2024   CL 97 (L) 09/30/2024   CREATININE 1.11 09/30/2024   BUN 32 (H) 09/30/2024   CO2 33 (H) 09/30/2024   TSH 0.495 09/08/2024   INR 0.9 01/25/2023   HGBA1C 6.3 (H) 07/05/2024    Results for orders placed or performed during the hospital encounter of 09/27/24  Basic metabolic panel   Collection Time: 09/27/24  8:07 AM  Result Value Ref Range   Sodium 142 135 - 145 mmol/L   Potassium 3.9 3.5 - 5.1 mmol/L   Chloride 96 (L) 98 - 111 mmol/L   CO2 33 (H) 22 - 32 mmol/L   Glucose, Bld 148 (H) 70 - 99 mg/dL   BUN 20 8 - 23 mg/dL   Creatinine, Ser 9.02 0.61 - 1.24 mg/dL   Calcium  10.2 8.9 - 10.3 mg/dL   GFR, Estimated >39 >39 mL/min   Anion gap 14 5 - 15  CBC   Collection Time: 09/27/24  8:07 AM  Result Value Ref Range   WBC 17.6 (H) 4.0 - 10.5 K/uL   RBC 5.35 4.22 - 5.81 MIL/uL   Hemoglobin 12.5 (L) 13.0 - 17.0 g/dL   HCT 57.8 60.9 - 47.9 %   MCV 78.7 (L) 80.0 - 100.0 fL   MCH 23.4 (L) 26.0 - 34.0 pg   MCHC 29.7 (L) 30.0 - 36.0 g/dL   RDW 79.2 (H) 88.4 - 84.4 %   Platelets 288 150 - 400 K/uL   nRBC 0.0 0.0 - 0.2 %  Pro Brain natriuretic peptide   Collection Time: 09/27/24  8:07 AM  Result Value Ref Range   Pro Brain Natriuretic Peptide 172.0 <300.0 pg/mL  Troponin T, High Sensitivity   Collection Time: 09/27/24  8:07 AM  Result Value Ref Range   Troponin T High Sensitivity 22  (H) 0 - 19  ng/L  Resp panel by RT-PCR (RSV, Flu A&B, Covid) Anterior Nasal Swab   Collection Time: 09/27/24  8:18 AM   Specimen: Anterior Nasal Swab  Result Value Ref Range   SARS Coronavirus 2 by RT PCR NEGATIVE NEGATIVE   Influenza A by PCR NEGATIVE NEGATIVE   Influenza B by PCR NEGATIVE NEGATIVE   Resp Syncytial Virus by PCR NEGATIVE NEGATIVE  I-Stat venous blood gas, ([facility], [facility], [facility])   Collection Time: 09/27/24  8:28 AM  Result Value Ref Range   pH, Ven 7.309 7.25 - 7.43   pCO2, Ven 71.8 (HH) 44 - 60 mmHg   pO2, Ven 32 32 - 45 mmHg   Bicarbonate 36.1 (H) 20.0 - 28.0 mmol/L   TCO2 38 (H) 22 - 32 mmol/L   O2 Saturation 54 %   Acid-Base Excess 7.0 (H) 0.0 - 2.0 mmol/L   Sodium 138 135 - 145 mmol/L   Potassium 3.9 3.5 - 5.1 mmol/L   Calcium , Ion 1.18 1.15 - 1.40 mmol/L   HCT 42.0 39.0 - 52.0 %   Hemoglobin 14.3 13.0 - 17.0 g/dL   Sample type VENOUS    Comment NOTIFIED PHYSICIAN   Troponin T, High Sensitivity   Collection Time: 09/27/24 12:04 PM  Result Value Ref Range   Troponin T High Sensitivity 37 (H) 0 - 19 ng/L  Glucose, capillary   Collection Time: 09/27/24  9:23 PM  Result Value Ref Range   Glucose-Capillary 256 (H) 70 - 99 mg/dL  Basic metabolic panel   Collection Time: 09/28/24  3:38 AM  Result Value Ref Range   Sodium 141 135 - 145 mmol/L   Potassium 5.2 (H) 3.5 - 5.1 mmol/L   Chloride 97 (L) 98 - 111 mmol/L   CO2 35 (H) 22 - 32 mmol/L   Glucose, Bld 175 (H) 70 - 99 mg/dL   BUN 25 (H) 8 - 23 mg/dL   Creatinine, Ser 8.72 (H) 0.61 - 1.24 mg/dL   Calcium  9.7 8.9 - 10.3 mg/dL   GFR, Estimated 57 (L) >60 mL/min   Anion gap 9 5 - 15  CBC   Collection Time: 09/28/24  3:38 AM  Result Value Ref Range   WBC 12.3 (H) 4.0 - 10.5 K/uL   RBC 5.08 4.22 - 5.81 MIL/uL   Hemoglobin 11.8 (L) 13.0 - 17.0 g/dL   HCT 60.5 60.9 - 47.9 %   MCV 77.6 (L) 80.0 - 100.0 fL   MCH 23.2 (L) 26.0 - 34.0 pg   MCHC 29.9 (L) 30.0 - 36.0 g/dL   RDW 79.9 (H) 88.4 - 84.4 %   Platelets  251 150 - 400 K/uL   nRBC 0.0 0.0 - 0.2 %  Digoxin  level   Collection Time: 09/28/24  3:38 AM  Result Value Ref Range   Digoxin  Level 1.0 0.8 - 2.0 ng/mL  Glucose, capillary   Collection Time: 09/28/24  6:24 AM  Result Value Ref Range   Glucose-Capillary 168 (H) 70 - 99 mg/dL  Glucose, capillary   Collection Time: 09/28/24 11:45 AM  Result Value Ref Range   Glucose-Capillary 163 (H) 70 - 99 mg/dL  Glucose, capillary   Collection Time: 09/28/24  3:41 PM  Result Value Ref Range   Glucose-Capillary 221 (H) 70 - 99 mg/dL  Glucose, capillary   Collection Time: 09/28/24  9:24 PM  Result Value Ref Range   Glucose-Capillary 275 (H) 70 - 99 mg/dL  Basic metabolic panel   Collection Time: 09/29/24  3:18 AM  Result Value Ref Range   Sodium 139 135 - 145 mmol/L   Potassium 4.0 3.5 - 5.1 mmol/L   Chloride 96 (L) 98 - 111 mmol/L   CO2 33 (H) 22 - 32 mmol/L   Glucose, Bld 89 70 - 99 mg/dL   BUN 33 (H) 8 - 23 mg/dL   Creatinine, Ser 8.81 0.61 - 1.24 mg/dL   Calcium  9.2 8.9 - 10.3 mg/dL   GFR, Estimated >39 >39 mL/min   Anion gap 10 5 - 15  CBC   Collection Time: 09/29/24  3:18 AM  Result Value Ref Range   WBC 16.4 (H) 4.0 - 10.5 K/uL   RBC 4.62 4.22 - 5.81 MIL/uL   Hemoglobin 10.8 (L) 13.0 - 17.0 g/dL   HCT 64.4 (L) 60.9 - 47.9 %   MCV 76.8 (L) 80.0 - 100.0 fL   MCH 23.4 (L) 26.0 - 34.0 pg   MCHC 30.4 30.0 - 36.0 g/dL   RDW 80.1 (H) 88.4 - 84.4 %   Platelets 252 150 - 400 K/uL   nRBC 0.0 0.0 - 0.2 %  Glucose, capillary   Collection Time: 09/29/24  6:08 AM  Result Value Ref Range   Glucose-Capillary 110 (H) 70 - 99 mg/dL  Glucose, capillary   Collection Time: 09/29/24 12:23 PM  Result Value Ref Range   Glucose-Capillary 134 (H) 70 - 99 mg/dL  Glucose, capillary   Collection Time: 09/29/24  3:27 PM  Result Value Ref Range   Glucose-Capillary 242 (H) 70 - 99 mg/dL  Glucose, capillary   Collection Time: 09/29/24  8:58 PM  Result Value Ref Range   Glucose-Capillary 187 (H)  70 - 99 mg/dL  Basic metabolic panel   Collection Time: 09/30/24  3:43 AM  Result Value Ref Range   Sodium 140 135 - 145 mmol/L   Potassium 5.0 3.5 - 5.1 mmol/L   Chloride 97 (L) 98 - 111 mmol/L   CO2 33 (H) 22 - 32 mmol/L   Glucose, Bld 136 (H) 70 - 99 mg/dL   BUN 32 (H) 8 - 23 mg/dL   Creatinine, Ser 8.88 0.61 - 1.24 mg/dL   Calcium  9.0 8.9 - 10.3 mg/dL   GFR, Estimated >39 >39 mL/min   Anion gap 10 5 - 15  CBC   Collection Time: 09/30/24  3:43 AM  Result Value Ref Range   WBC 10.9 (H) 4.0 - 10.5 K/uL   RBC 4.61 4.22 - 5.81 MIL/uL   Hemoglobin 10.6 (L) 13.0 - 17.0 g/dL   HCT 64.2 (L) 60.9 - 47.9 %   MCV 77.4 (L) 80.0 - 100.0 fL   MCH 23.0 (L) 26.0 - 34.0 pg   MCHC 29.7 (L) 30.0 - 36.0 g/dL   RDW 80.8 (H) 88.4 - 84.4 %   Platelets 228 150 - 400 K/uL   nRBC 0.0 0.0 - 0.2 %  Glucose, capillary   Collection Time: 09/30/24  8:23 AM  Result Value Ref Range   Glucose-Capillary 106 (H) 70 - 99 mg/dL  .  Assessment & Plan:   Assessment & Plan COPD with acute exacerbation (HCC) Chronic respiratory failure with hypoxemia and asthma exacerbation Chronic respiratory failure with hypoxemia and recent asthma exacerbation. Oxygen  saturation drops to low 80s with exertion. On continuous oxygen  therapy and steroid taper. Receiving three nebulizer treatments daily. Recent hospitalization due to exacerbation. - Continue oxygen  therapy as prescribed. - Continue steroid taper, reducing dose gradually from 40 mg to 30 mg, then to 20 mg, with  consideration to slow further tapering as needed. - Continue three nebulizer treatments daily. - Schedule follow-up with pulmonologist, attempt to move appointment earlier than November 6th. Orders:   CBC with Differential   Comprehensive metabolic panel with GFR  Chronic diastolic CHF (congestive heart failure) (HCC) Recent exacerbation and hospital admission - Continue current medication regimen - labs drawn, Await labs/testing for assessment and  recommendations Orders:   Pro b natriuretic peptide (BNP)    Orders Placed This Encounter  Procedures   Pro b natriuretic peptide (BNP)   CBC with Differential   Comprehensive metabolic panel with GFR      Follow-up: Return for keep scheduled appointment with Dr. Sherre next week.  An After Visit Summary was printed and given to the patient.  Harrie Cedar, FNP [facility] [phone]

## 2024-10-04 NOTE — Transitions of Care (Post Inpatient/ED Visit) (Signed)
   10/04/2024  Name: Paul [name] MRN: [id number] DOB: 04-13-1946  Today's TOC FU Call Status: Today's TOC FU Call Status:: Successful TOC FU Call Completed TOC FU Call Complete Date: 10/04/24 (spoke with patient who states he saw PCP today and feels he has everything he needs - declined TOC call/program) Patient's Name and Date of Birth confirmed.  Transition Care Management Follow-up Telephone Call   Shona Prow RN, CCM [facility]  VBCI-Population Health RN Care Manager [phone]

## 2024-10-04 NOTE — Assessment & Plan Note (Addendum)
 Chronic respiratory failure with hypoxemia and asthma exacerbation Chronic respiratory failure with hypoxemia and recent asthma exacerbation. Oxygen  saturation drops to low 80s with exertion. On continuous oxygen  therapy and steroid taper. Receiving three nebulizer treatments daily. Recent hospitalization due to exacerbation. - Continue oxygen  therapy as prescribed. - Continue steroid taper, reducing dose gradually from 40 mg to 30 mg, then to 20 mg, with consideration to slow further tapering as needed. - Continue three nebulizer treatments daily. - Schedule follow-up with pulmonologist, attempt to move appointment earlier than November 6th. Orders:   CBC with Differential   Comprehensive metabolic panel with GFR

## 2024-10-04 NOTE — Assessment & Plan Note (Signed)
 Recent exacerbation and hospital admission - Continue current medication regimen - labs drawn, Await labs/testing for assessment and recommendations Orders:   Pro b natriuretic peptide (BNP)

## 2024-10-05 ENCOUNTER — Other Ambulatory Visit: Payer: Self-pay | Admitting: Family Medicine

## 2024-10-05 ENCOUNTER — Telehealth: Payer: Self-pay | Admitting: *Deleted

## 2024-10-05 LAB — CBC WITH DIFFERENTIAL/PLATELET
Basophils Absolute: 0 x10E3/uL (ref 0.0–0.2)
Basos: 0 %
EOS (ABSOLUTE): 0 x10E3/uL (ref 0.0–0.4)
Eos: 0 %
Hematocrit: 40.3 % (ref 37.5–51.0)
Hemoglobin: 11.8 g/dL — ABNORMAL LOW (ref 13.0–17.7)
Immature Grans (Abs): 0.2 x10E3/uL — ABNORMAL HIGH (ref 0.0–0.1)
Immature Granulocytes: 1 %
Lymphocytes Absolute: 1 x10E3/uL (ref 0.7–3.1)
Lymphs: 7 %
MCH: 23.3 pg — ABNORMAL LOW (ref 26.6–33.0)
MCHC: 29.3 g/dL — ABNORMAL LOW (ref 31.5–35.7)
MCV: 80 fL (ref 79–97)
Monocytes Absolute: 0.7 x10E3/uL (ref 0.1–0.9)
Monocytes: 5 %
Neutrophils Absolute: 12 x10E3/uL — ABNORMAL HIGH (ref 1.4–7.0)
Neutrophils: 87 %
Platelets: 276 x10E3/uL (ref 150–450)
RBC: 5.07 x10E6/uL (ref 4.14–5.80)
RDW: 17.9 % — ABNORMAL HIGH (ref 11.6–15.4)
WBC: 13.9 x10E3/uL — ABNORMAL HIGH (ref 3.4–10.8)

## 2024-10-05 LAB — COMPREHENSIVE METABOLIC PANEL WITH GFR
ALT: 71 IU/L — ABNORMAL HIGH (ref 0–44)
AST: 28 IU/L (ref 0–40)
Albumin: 4 g/dL (ref 3.8–4.8)
Alkaline Phosphatase: 102 IU/L (ref 47–123)
BUN/Creatinine Ratio: 21 (ref 10–24)
BUN: 23 mg/dL (ref 8–27)
Bilirubin Total: <0.2 mg/dL (ref 0.0–1.2)
CO2: 30 mmol/L — ABNORMAL HIGH (ref 20–29)
Calcium: 9.5 mg/dL (ref 8.6–10.2)
Chloride: 100 mmol/L (ref 96–106)
Creatinine, Ser: 1.12 mg/dL (ref 0.76–1.27)
Globulin, Total: 2.2 g/dL (ref 1.5–4.5)
Glucose: 134 mg/dL — ABNORMAL HIGH (ref 70–99)
Potassium: 5.1 mmol/L (ref 3.5–5.2)
Sodium: 145 mmol/L — ABNORMAL HIGH (ref 134–144)
Total Protein: 6.2 g/dL (ref 6.0–8.5)
eGFR: 67 mL/min/1.73 (ref >59)

## 2024-10-05 LAB — PRO B NATRIURETIC PEPTIDE: NT-Pro BNP: 194 pg/mL (ref 0–486)

## 2024-10-05 MED ORDER — PREDNISONE 10 MG PO TABS: ORAL_TABLET | ORAL | 1 refills | Status: DC

## 2024-10-05 NOTE — Telephone Encounter (Signed)
 Copied from CRM [id number]. Topic: Clinical - Prescription Issue >> Oct 01, 2024  1:47 PM Corean SAUNDERS wrote: Reason for CRM: Patients wife Paul [name] is requesting Paul [name] to please order him more prednisone  as he can't run out of it and the patient has an upcoming appointment on 11/6 - Paul [name] is very worried that if the patient runs out of prednisone  he will be back in the hospital.   Please send prescription to:  [id number] - [place], [place] - [address] [facility] [place] [address] Phone: [phone] Fax: [phone] >> Oct 05, 2024 12:39 PM Joesph PARAS wrote: Patient spouse is calling to request a refill of prednisone , as patient requires a slower taper of prednisone . Patient's spouse is requesting this ASAP.   Called and spoke with the pt  I have sent refill on the prednisone   Nothing further needed

## 2024-10-07 ENCOUNTER — Ambulatory Visit: Payer: Self-pay | Admitting: Family Medicine

## 2024-10-08 NOTE — Telephone Encounter (Signed)
 PAP: Application for Breztri and Farxiga has been submitted to [employer] ([employer]), via fax

## 2024-10-11 ENCOUNTER — Other Ambulatory Visit (HOSPITAL_COMMUNITY): Payer: Self-pay

## 2024-10-11 ENCOUNTER — Other Ambulatory Visit

## 2024-10-11 NOTE — Progress Notes (Signed)
 Pharmacy Medication Assistance Program Note    10/11/2024  Patient ID: Paul [name], male   DOB: 1945-12-24, 78 y.o.   MRN: [id number]     09/23/2024  Outreach Medication One  Initial Outreach Date (Medication One) 09/23/2024  Manufacturer Medication One [employer]  [employer] [facility] Bretztri  Dose of Farxiga  10mg   Type of Radiographer, therapeutic Assistance  Date Application Sent to Patient 09/23/2024  Application Items Requested Application  Date Application Sent to Prescriber 09/23/2024  Name of Prescriber [name]  Date Application Received From Patient 10/08/2024  Date Application Received From Provider 09/29/2024  Date Application Submitted to Manufacturer 10/08/2024  Patient Assistance Determination Approved  Approval Start Date 12/23/2024  Approval End Date 12/22/2025  Patient Notification Method Telephone Call     Signature

## 2024-10-11 NOTE — Telephone Encounter (Signed)
 PAP: Patient assistance application for Breztri  and Farxiga  has been approved by PAP Companies: [employer] from 1/1/20026 to 12/22/2025. Medication should be delivered to PAP Delivery: Home. For further shipping updates, please contact [employer] ([employer]) at [phone]. Patient ID is: [id number]

## 2024-10-14 ENCOUNTER — Emergency Department (HOSPITAL_BASED_OUTPATIENT_CLINIC_OR_DEPARTMENT_OTHER)

## 2024-10-14 ENCOUNTER — Other Ambulatory Visit: Payer: Self-pay

## 2024-10-14 ENCOUNTER — Other Ambulatory Visit: Payer: Self-pay | Admitting: Family Medicine

## 2024-10-14 ENCOUNTER — Ambulatory Visit: Admitting: Family Medicine

## 2024-10-14 ENCOUNTER — Encounter (HOSPITAL_BASED_OUTPATIENT_CLINIC_OR_DEPARTMENT_OTHER): Payer: Self-pay

## 2024-10-14 ENCOUNTER — Inpatient Hospital Stay (HOSPITAL_BASED_OUTPATIENT_CLINIC_OR_DEPARTMENT_OTHER)
Admission: EM | Admit: 2024-10-14 | Discharge: 2024-10-19 | DRG: 189 | Disposition: A | Discharge status: Home / Self Care | Attending: Family Medicine | Admitting: Family Medicine

## 2024-10-14 VITALS — BP 134/62 | HR 92 | Temp 98.0°F | Ht 65.0 in | Wt 157.0 lb

## 2024-10-14 DIAGNOSIS — I10 Essential (primary) hypertension: Secondary | ICD-10-CM | POA: Diagnosis present

## 2024-10-14 DIAGNOSIS — Z87891 Personal history of nicotine dependence: Secondary | ICD-10-CM | POA: Diagnosis not present

## 2024-10-14 DIAGNOSIS — Z888 Allergy status to other drugs, medicaments and biological substances status: Secondary | ICD-10-CM

## 2024-10-14 DIAGNOSIS — Z7952 Long term (current) use of systemic steroids: Secondary | ICD-10-CM

## 2024-10-14 DIAGNOSIS — Z794 Long term (current) use of insulin: Secondary | ICD-10-CM | POA: Diagnosis not present

## 2024-10-14 DIAGNOSIS — Z95818 Presence of other cardiac implants and grafts: Secondary | ICD-10-CM | POA: Diagnosis not present

## 2024-10-14 DIAGNOSIS — J441 Chronic obstructive pulmonary disease with (acute) exacerbation: Secondary | ICD-10-CM | POA: Diagnosis not present

## 2024-10-14 DIAGNOSIS — I110 Hypertensive heart disease with heart failure: Secondary | ICD-10-CM | POA: Diagnosis present

## 2024-10-14 DIAGNOSIS — E782 Mixed hyperlipidemia: Secondary | ICD-10-CM

## 2024-10-14 DIAGNOSIS — J9621 Acute and chronic respiratory failure with hypoxia: Secondary | ICD-10-CM | POA: Diagnosis not present

## 2024-10-14 DIAGNOSIS — E1159 Type 2 diabetes mellitus with other circulatory complications: Secondary | ICD-10-CM

## 2024-10-14 DIAGNOSIS — K219 Gastro-esophageal reflux disease without esophagitis: Secondary | ICD-10-CM | POA: Diagnosis present

## 2024-10-14 DIAGNOSIS — Z7982 Long term (current) use of aspirin: Secondary | ICD-10-CM | POA: Diagnosis not present

## 2024-10-14 DIAGNOSIS — I700 Atherosclerosis of aorta: Secondary | ICD-10-CM | POA: Diagnosis present

## 2024-10-14 DIAGNOSIS — Z79890 Hormone replacement therapy (postmenopausal): Secondary | ICD-10-CM

## 2024-10-14 DIAGNOSIS — R0603 Acute respiratory distress: Principal | ICD-10-CM

## 2024-10-14 DIAGNOSIS — Z91048 Other nonmedicinal substance allergy status: Secondary | ICD-10-CM | POA: Diagnosis not present

## 2024-10-14 DIAGNOSIS — I5032 Chronic diastolic (congestive) heart failure: Secondary | ICD-10-CM | POA: Diagnosis present

## 2024-10-14 DIAGNOSIS — I152 Hypertension secondary to endocrine disorders: Secondary | ICD-10-CM | POA: Diagnosis not present

## 2024-10-14 DIAGNOSIS — Z79899 Other long term (current) drug therapy: Secondary | ICD-10-CM

## 2024-10-14 DIAGNOSIS — F411 Generalized anxiety disorder: Secondary | ICD-10-CM | POA: Diagnosis present

## 2024-10-14 DIAGNOSIS — Z8249 Family history of ischemic heart disease and other diseases of the circulatory system: Secondary | ICD-10-CM

## 2024-10-14 DIAGNOSIS — R0682 Tachypnea, not elsewhere classified: Secondary | ICD-10-CM | POA: Diagnosis not present

## 2024-10-14 DIAGNOSIS — E039 Hypothyroidism, unspecified: Secondary | ICD-10-CM | POA: Diagnosis present

## 2024-10-14 DIAGNOSIS — I480 Paroxysmal atrial fibrillation: Secondary | ICD-10-CM | POA: Diagnosis present

## 2024-10-14 DIAGNOSIS — Z881 Allergy status to other antibiotic agents status: Secondary | ICD-10-CM | POA: Diagnosis not present

## 2024-10-14 DIAGNOSIS — J4551 Severe persistent asthma with (acute) exacerbation: Secondary | ICD-10-CM | POA: Diagnosis not present

## 2024-10-14 DIAGNOSIS — Z8616 Personal history of COVID-19: Secondary | ICD-10-CM | POA: Diagnosis not present

## 2024-10-14 DIAGNOSIS — G4733 Obstructive sleep apnea (adult) (pediatric): Secondary | ICD-10-CM | POA: Diagnosis present

## 2024-10-14 DIAGNOSIS — K5730 Diverticulosis of large intestine without perforation or abscess without bleeding: Secondary | ICD-10-CM | POA: Diagnosis not present

## 2024-10-14 DIAGNOSIS — E1169 Type 2 diabetes mellitus with other specified complication: Secondary | ICD-10-CM | POA: Diagnosis not present

## 2024-10-14 DIAGNOSIS — Z1152 Encounter for screening for COVID-19: Secondary | ICD-10-CM | POA: Diagnosis not present

## 2024-10-14 DIAGNOSIS — R918 Other nonspecific abnormal finding of lung field: Secondary | ICD-10-CM | POA: Diagnosis not present

## 2024-10-14 DIAGNOSIS — M810 Age-related osteoporosis without current pathological fracture: Secondary | ICD-10-CM | POA: Diagnosis present

## 2024-10-14 DIAGNOSIS — D649 Anemia, unspecified: Secondary | ICD-10-CM | POA: Diagnosis present

## 2024-10-14 LAB — CBC WITH DIFFERENTIAL/PLATELET
Abs Immature Granulocytes: 0.07 K/uL (ref 0.00–0.07)
Basophils Absolute: 0 K/uL (ref 0.0–0.1)
Basophils Relative: 0 %
Eosinophils Absolute: 0 K/uL (ref 0.0–0.5)
Eosinophils Relative: 0 %
HCT: 42.4 % (ref 39.0–52.0)
Hemoglobin: 12.8 g/dL — ABNORMAL LOW (ref 13.0–17.0)
Immature Granulocytes: 1 %
Lymphocytes Relative: 8 %
Lymphs Abs: 0.9 K/uL (ref 0.7–4.0)
MCH: 23.7 pg — ABNORMAL LOW (ref 26.0–34.0)
MCHC: 30.2 g/dL (ref 30.0–36.0)
MCV: 78.7 fL — ABNORMAL LOW (ref 80.0–100.0)
Monocytes Absolute: 0.7 K/uL (ref 0.1–1.0)
Monocytes Relative: 6 %
Neutro Abs: 9.7 K/uL — ABNORMAL HIGH (ref 1.7–7.7)
Neutrophils Relative %: 85 %
Platelets: 284 K/uL (ref 150–400)
RBC: 5.39 MIL/uL (ref 4.22–5.81)
RDW: 18.5 % — ABNORMAL HIGH (ref 11.5–15.5)
WBC: 11.4 K/uL — ABNORMAL HIGH (ref 4.0–10.5)
nRBC: 0 % (ref 0.0–0.2)

## 2024-10-14 LAB — RESP PANEL BY RT-PCR (RSV, FLU A&B, COVID)  RVPGX2
Influenza A by PCR: NEGATIVE
Influenza B by PCR: NEGATIVE
Resp Syncytial Virus by PCR: NEGATIVE
SARS Coronavirus 2 by RT PCR: NEGATIVE

## 2024-10-14 LAB — BASIC METABOLIC PANEL WITH GFR
Anion gap: 13 (ref 5–15)
BUN: 22 mg/dL (ref 8–23)
CO2: 31 mmol/L (ref 22–32)
Calcium: 9.5 mg/dL (ref 8.9–10.3)
Chloride: 95 mmol/L — ABNORMAL LOW (ref 98–111)
Creatinine, Ser: 1.06 mg/dL (ref 0.61–1.24)
GFR, Estimated: >60 mL/min (ref >60)
Glucose, Bld: 145 mg/dL — ABNORMAL HIGH (ref 70–99)
Potassium: 4.3 mmol/L (ref 3.5–5.1)
Sodium: 139 mmol/L (ref 135–145)

## 2024-10-14 LAB — TROPONIN T, HIGH SENSITIVITY: Troponin T High Sensitivity: <15 ng/L (ref 0–19)

## 2024-10-14 LAB — POCT LIPID PANEL
HDL: 69
LDL: 92
Non-HDL: 128
TC: 197
TRG: 178

## 2024-10-14 LAB — POCT GLYCOSYLATED HEMOGLOBIN (HGB A1C): HbA1c POC (<> result, manual entry): 6.8 % (ref 4.0–5.6)

## 2024-10-14 LAB — PRO BRAIN NATRIURETIC PEPTIDE: Pro Brain Natriuretic Peptide: 262 pg/mL (ref <300.0)

## 2024-10-14 MED ORDER — AMOXICILLIN-POT CLAVULANATE 875-125 MG PO TABS: 1.0000 | ORAL_TABLET | Freq: Two times a day (BID) | ORAL | 0 refills | Status: DC

## 2024-10-14 MED ORDER — IPRATROPIUM BROMIDE 0.02 % IN SOLN: 1.0000 mg | Freq: Once | RESPIRATORY_TRACT | Status: DC

## 2024-10-14 MED ORDER — ALBUTEROL SULFATE (2.5 MG/3ML) 0.083% IN NEBU: 10.0000 mg | INHALATION_SOLUTION | Freq: Once | RESPIRATORY_TRACT | Status: DC

## 2024-10-14 MED ORDER — METHYLPREDNISOLONE SODIUM SUCC 125 MG IJ SOLR
125.0000 mg | Freq: Once | INTRAMUSCULAR | Status: AC
  Administered 2024-10-14: 125 mg via INTRAVENOUS
  Filled 2024-10-14: qty 2

## 2024-10-14 MED ORDER — IPRATROPIUM-ALBUTEROL 0.5-2.5 (3) MG/3ML IN SOLN
3.0000 mL | Freq: Once | RESPIRATORY_TRACT | Status: AC
  Administered 2024-10-14: 3 mL via RESPIRATORY_TRACT
  Filled 2024-10-14: qty 3

## 2024-10-14 MED ORDER — IPRATROPIUM-ALBUTEROL 0.5-2.5 (3) MG/3ML IN SOLN: 3.0000 mL | Freq: Once | RESPIRATORY_TRACT | Status: DC

## 2024-10-14 MED ORDER — IOHEXOL 350 MG/ML SOLN
100.0000 mL | Freq: Once | INTRAVENOUS | Status: AC | PRN
  Administered 2024-10-14: 100 mL via INTRAVENOUS

## 2024-10-14 MED ORDER — AIRSUPRA 90-80 MCG/ACT IN AERO: 2.0000 | INHALATION_SPRAY | Freq: Four times a day (QID) | RESPIRATORY_TRACT | 2 refills | Status: DC | PRN

## 2024-10-14 NOTE — Patient Instructions (Signed)
  VISIT SUMMARY: During your visit, we discussed your COPD management, recent respiratory infection, diabetes, and hyperlipidemia. We also reviewed your immunization status and general health maintenance.  YOUR PLAN: CHRONIC OBSTRUCTIVE PULMONARY DISEASE WITH ACUTE LOWER RESPIRATORY INFECTION: You have a COPD exacerbation likely due to a lower respiratory infection, with symptoms worsening in cold weather. -Prescribe Augmentin  and send prescription to [facility]. -Continue slow taper of prednisone . - change albuterol  to proair  2 puffs four times a day as needed.  -Follow up with pulmonologist on November 6.  TYPE 2 DIABETES MELLITUS: Your diabetes is currently well-controlled, but you are not monitoring your blood sugars at home. -Order A1c test.  HYPERLIPIDEMIA: You have elevated triglycerides. -Order cholesterol panel.  GENERAL HEALTH MAINTENANCE: We discussed your need for the COVID-19 vaccine and confirmed you received the flu vaccine. -Administer COVID-19 vaccine, or coordinate with pulmonologist to administer during upcoming visit.                      Contains text generated by Abridge.                                 Contains text generated by Abridge.

## 2024-10-14 NOTE — Assessment & Plan Note (Addendum)
 COPD exacerbation likely due to lower respiratory infection. Wheezing and hypoxia noted with cold exposure. Green sputum suggests ongoing infection. Allergic to Levaquin , Augmentin  chosen. - Prescribe Augmentin  and send prescription to [facility]. - Continue slow taper of prednisone . - Follow up with pulmonologist on November 6.

## 2024-10-14 NOTE — Plan of Care (Signed)
 Plan of Care Note for accepted transfer   Patient name: Paul [name] FMW:[id number] DOB: 26-Feb-1946  Facility requesting transfer: [facility] Requesting Provider: Dr. Gennaro Reason for transfer: COPD exacerbation Facility course: 78 year old male with history of COPD, OSA, chronic respiratory failure, chronic HFpEF, hypertension, type 2 diabetes, paroxysmal A-fib with Watchman device, hypothyroidism, anxiety.  Recent hospital admission 10/6-10/9 for COPD exacerbation.  Patient presents to the ED today for evaluation of shortness of breath and oxygen  saturation in the 70s at home while ambulating on 2 L [place].  Placed on BiPAP due to tachypnea/increased work of breathing.  Labs showing WBC count 11.4, hemoglobin 12.8 (stable), troponin negative, pro BNP 262, COVID/influenza/RSV PCR pending.  Chest x-ray showing no acute cardiopulmonary process.  Patient was given Solu-Medrol  and DuoNeb.  Plan of care: The patient is accepted for admission to Progressive unit at [facility].  [facility] will assume care on arrival to accepting facility. Until arrival, care as per EDP. However, [facility] available 24/7 for questions and assistance.  Check [email or web address] for on-call coverage.  Nursing staff, please call [facility] Admits & Consults System-Wide number under Amion on patient's arrival so appropriate admitting provider can evaluate the pt.

## 2024-10-14 NOTE — Assessment & Plan Note (Addendum)
 Hyperlipidemia with previously elevated triglycerides. - Order cholesterol panel. Orders:   POCT Lipid Panel

## 2024-10-14 NOTE — Assessment & Plan Note (Addendum)
 Well controlled.  No changes to medicines.   Norvasc  2.5 mg daily, Lasix  40 mg 40 MG TWICE DAILY. Continue to work on eating a healthy diet and exercise.   Type 2 diabetes mellitus, not monitoring blood sugars at home. Recent high blood sugar likely due to steroids. Last A1c 6.3, indicating good control. - Order A1c test. Orders:   POCT glycosylated hemoglobin (Hb A1C)

## 2024-10-14 NOTE — ED Provider Notes (Signed)
 [place] [facility] Provider Note   CSN: [id number] Arrival date & time: 10/14/24  2029     Patient presents with: Shortness of Breath   Paul [name] is a 78 y.o. male.   78 year old male presents for evaluation of shortness of breath.  States he has a history of COPD and has been frequently getting care ups.  Has had 2 recent admissions for this.  He states he uses a CPAP at night.  States has been using his nebulizers at home without much help.  He states he has been ambulating at home with oxygen  saturation going down to the 70s.  Has increased his baseline 2 L to 3 L.  Denies any other symptoms or concerns.   Shortness of Breath Associated symptoms: no abdominal pain, no chest pain, no cough, no ear pain, no fever, no rash, no sore throat and no vomiting        Prior to Admission medications   Medication Sig Start Date End Date Taking? Authorizing Provider  Accu-Chek Softclix Lancets lancets Check fasting sugar once daily. 08/05/23   CoxAbigail, MD  albuterol  (PROVENTIL ) (2.5 MG/3ML) 0.083% nebulizer solution Take 3 mLs (2.5 mg total) by nebulization every 6 (six) hours as needed for shortness of breath or wheezing. Patient taking differently: Take 2.5 mg by nebulization daily. 10/23/22   Parrett, Madelin RAMAN, NP  Albuterol -Budesonide  (AIRSUPRA) 90-80 MCG/ACT AERO Inhale 2 Inhalations into the lungs 4 (four) times daily as needed (wheezing). 10/14/24   CoxAbigail, MD  ALPRAZolam  (XANAX ) 0.5 MG tablet Take 1 tablet (0.5 mg total) by mouth 2 (two) times daily as needed for anxiety. F41.1 Patient taking differently: Take 0.5 mg by mouth at bedtime. F41.1 09/24/24   Nicholaus Credit, PA-C  amLODipine  (NORVASC ) 5 MG tablet Take 1 tablet (5 mg total) by mouth daily. 09/12/24   Fausto Burnard LABOR, DO  amoxicillin -clavulanate (AUGMENTIN ) 875-125 MG tablet Take 1 tablet by mouth 2 (two) times daily. 10/14/24   CoxAbigail, MD  Ascorbic Acid  (VITAMIN C ) 1000  MG tablet Take 1,000 mg by mouth daily.    [provider]  aspirin  EC 81 MG tablet Take 1 tablet (81 mg total) by mouth daily. Swallow whole. 01/28/22   McDaniel, Jill D, NP  atorvastatin  (LIPITOR) 40 MG tablet TAKE 1 TABLET BY MOUTH EVERYDAY AT BEDTIME Patient taking differently: Take 40 mg by mouth at bedtime. 07/22/24   CoxAbigail, MD  budesonide -glycopyrrolate -formoterol  (BREZTRI  AEROSPHERE) 160-9-4.8 MCG/ACT AERO inhaler Inhale 2 puffs into the lungs 2 (two) times daily. 07/06/24   CoxAbigail, MD  CALCIUM  PO Take 1 tablet by mouth daily.    [provider]  Cholecalciferol  (VITAMIN D3) 50 MCG (2000 UT) TABS Take 2,000 Units by mouth in the morning.    [provider]  dapagliflozin  propanediol (FARXIGA ) 10 MG TABS tablet Take 1 tablet (10 mg total) by mouth daily. 07/06/24   CoxAbigail, MD  denosumab  (PROLIA ) 60 MG/ML SOSY injection Inject 60 mg into the skin every 6 (six) months. 03/11/23   Cox, [name], MD  digoxin  (LANOXIN ) 0.125 MG tablet TAKE 1 TABLET BY MOUTH DAILY 04/26/24   Jeffrie Oneil BROCKS, MD  ferrous sulfate  325 (65 FE) MG tablet Take 325 mg by mouth 2 (two) times daily with a meal. Patient taking differently: Take 325 mg by mouth daily with breakfast.    [provider]  furosemide  (LASIX ) 40 MG tablet Take 1 tablet (40 mg total) by  mouth daily. 09/12/24   Fausto Sor A, DO  Glucose Blood (BLOOD GLUCOSE TEST STRIPS) STRP Use to test blood sugar up to twice daily. May substitute to any manufacturer covered by patient's insurance. E11.0 07/14/23   CoxAbigail, MD  Guaifenesin  (MUCINEX  MAXIMUM STRENGTH) 1200 MG TB12 Take 1,200 mg by mouth in the morning and at bedtime.    [provider]  ipratropium-albuterol  (DUONEB) 0.5-2.5 (3) MG/3ML SOLN Take 3 mLs by nebulization 2 (two) times daily for 10 days. Patient taking differently: Take 3 mLs by nebulization daily. 09/11/24 10/14/24  Fausto Sor LABOR, DO  Lancets Misc. MISC Use to test blood  sugar up to twice daily. May substitute to any manufacturer covered by patient's insurance. E11.0 07/14/23   CoxAbigail, MD  levothyroxine  (SYNTHROID ) 25 MCG tablet Take 1 tablet (25 mcg total) by mouth daily. Patient taking differently: Take 25 mcg by mouth daily before breakfast. 05/21/24   Cox, Kirsten, MD  loratadine  (CLARITIN ) 10 MG tablet Take 10 mg by mouth in the morning.    [provider]  Melatonin 12 MG TABS Take 12 mg by mouth at bedtime.    [provider]  Multiple Vitamin (MULTIVITAMIN WITH MINERALS) TABS tablet Take 1 tablet by mouth daily with breakfast.    [provider]  NUCALA  100 MG/ML SOSY Inject 100 mg into the skin every 30 (thirty) days.    [provider]  Omega-3 Fatty Acids  (FISH OIL) 1200 MG CAPS Take 1,200 mg by mouth at bedtime.    [provider]  OXYGEN  Inhale 2-2.5 L/min into the lungs See admin instructions. 2-2.5 L/min at bedtime and as needed during the day and/or with  exercise/activity- for shortness of breath    [provider]  pantoprazole  (PROTONIX ) 40 MG tablet Take 1 tablet (40 mg total) by mouth 2 (two) times daily. Patient taking differently: Take 40 mg by mouth 2 (two) times daily before a meal. 05/20/24   Parrett, Madelin RAMAN, NP  predniSONE  (DELTASONE ) 10 MG tablet 1/2 to 2 tablets daily as directed 10/05/24   Wert, Michael B, MD  predniSONE  (DELTASONE ) 20 MG tablet Take 2 tablets (40 mg total) by mouth daily with breakfast. 10/01/24   Rashid, Farhan, MD  Probiotic Product (PROBIOTIC DAILY) CAPS Take 1 capsule by mouth in the morning.    [provider]  sertraline  (ZOLOFT ) 50 MG tablet Take 1 tablet (50 mg total) by mouth at bedtime. 10/05/24   CoxAbigail, MD    Allergies: Tape, Daliresp  [roflumilast ], Levaquin  [levofloxacin ], and Alendronate    Review of Systems  Constitutional:  Negative for chills and fever.  HENT:  Negative for ear pain and sore throat.   Eyes:  Negative for pain  and visual disturbance.  Respiratory:  Positive for shortness of breath. Negative for cough.   Cardiovascular:  Negative for chest pain and palpitations.  Gastrointestinal:  Negative for abdominal pain and vomiting.  Genitourinary:  Negative for dysuria and hematuria.  Musculoskeletal:  Negative for arthralgias and back pain.  Skin:  Negative for color change and rash.  Neurological:  Negative for seizures and syncope.  All other systems reviewed and are negative.   Updated Vital Signs BP (!) 162/69   Pulse 82   Temp 97.6 F (36.4 C) (Oral)   Resp 19   Ht 5' 5 (1.651 m)   Wt 71.2 kg   SpO2 100%   BMI 26.12 kg/m   Physical Exam Vitals and nursing note reviewed.  Constitutional:      General: He is in acute distress.     Appearance: He is well-developed. He is ill-appearing.  HENT:     Head: Normocephalic and atraumatic.  Eyes:     Conjunctiva/sclera: Conjunctivae normal.  Cardiovascular:     Rate and Rhythm: Normal rate and regular rhythm.     Heart sounds: No murmur heard. Pulmonary:     Effort: Respiratory distress present.     Breath sounds: Decreased breath sounds present.     Comments: Tachypneic, increased work of breathing, poor air movement throughout Abdominal:     Palpations: Abdomen is soft.     Tenderness: There is no abdominal tenderness.  Musculoskeletal:        General: No swelling.     Cervical back: Neck supple.  Skin:    General: Skin is warm and dry.     Capillary Refill: Capillary refill takes less than 2 seconds.  Neurological:     Mental Status: He is alert.  Psychiatric:        Mood and Affect: Mood normal.     (all labs ordered are listed, but only abnormal results are displayed) Labs Reviewed  BASIC METABOLIC PANEL WITH GFR - Abnormal; Notable for the following components:      Result Value   Chloride 95 (*)    Glucose, Bld 145 (*)    All other components within normal limits  CBC WITH DIFFERENTIAL/PLATELET - Abnormal; Notable for  the following components:   WBC 11.4 (*)    Hemoglobin 12.8 (*)    MCV 78.7 (*)    MCH 23.7 (*)    RDW 18.5 (*)    Neutro Abs 9.7 (*)    All other components within normal limits  RESP PANEL BY RT-PCR (RSV, FLU A&B, COVID)  [id number]  PRO BRAIN NATRIURETIC PEPTIDE  TROPONIN T, HIGH SENSITIVITY  TROPONIN T, HIGH SENSITIVITY    EKG: EKG Interpretation Date/Time:  Thursday October 14 2024 21:13:40 EDT Ventricular Rate:  86 PR Interval:  134 QRS Duration:  87 QT Interval:  336 QTC Calculation: 402 R Axis:   69  Text Interpretation: Sinus rhythm Compared with prior EKG from  09/27/2024 Confirmed by Gennaro Bouchard ([id number]) on 10/14/2024 9:25:34 PM  Radiology: DG Chest 1 View Result Date: 10/14/2024 EXAM: 1 VIEW XRAY OF THE CHEST 10/14/2024 09:54:00 PM COMPARISON: Comparison with 09/27/2024. CLINICAL HISTORY: SOB. Pt reports increased SOB for the past week. Recently seen here 2 weeks ago for same problem. Pt reports O2 saturation dropping into 70s at home while ambulating on 2L. Pt presents tachypneic with increased work of breathing, two word answers only. Admitted for COPD exacerbation 2 weeks ago. FINDINGS: LUNGS AND PLEURA: No focal pulmonary opacity. No pulmonary edema. No pleural effusion. No pneumothorax. HEART AND MEDIASTINUM: No acute abnormality of the cardiac and mediastinal silhouettes. BONES AND SOFT TISSUES: No acute osseous abnormality. IMPRESSION: 1. No acute cardiopulmonary process. Electronically signed by: Norman Gatlin MD 10/14/2024 09:58 PM EDT RP Workstation: [id number]     Procedures   Medications Ordered in the ED  methylPREDNISolone  sodium succinate  (SOLU-MEDROL ) 125 mg/2 mL injection 125 mg (125 mg Intravenous Given 10/14/24 2139)  ipratropium-albuterol  (DUONEB) 0.5-2.5 (3) MG/3ML nebulizer solution 3 mL (3 mLs Nebulization Given 10/14/24 2225)  iohexol  (OMNIPAQUE ) 350 MG/ML injection 100 mL (100 mLs Intravenous Contrast Given 10/14/24 2309)  Medical Decision Making Cardiac monitor interpretation: Sinus rhythm, no ectopy Patient here for shortness of breath.  He arrived in respiratory distress tachypneic with poor air movement.  Given a breathing treatment steroids and started on BiPAP.  He had improvement in his symptoms.  Did start having right flank pain and I ordered a CTA of his chest abdomen pelvis which is pending at this time.  Labs otherwise fairly unremarkable.  Discussed patient case with Dr. Marry, hospitalist the patient will be admitted for further workup and management.  Patient is agreeable to plan.   Problems Addressed: COPD exacerbation (HCC): acute illness or injury that poses a threat to life or bodily functions Respiratory distress: acute illness or injury that poses a threat to life or bodily functions  Amount and/or Complexity of Data Reviewed External Data Reviewed: notes.    Details: Prior hospital records reviewed and patient was discharged about 2 weeks ago after being admitted for COPD exacerbation Labs: ordered. Decision-making details documented in ED Course.    Details: Ordered And reviewed by me and unremarkable Radiology: ordered and independent interpretation performed. Decision-making details documented in ED Course.    Details: Ordered and interpreted by me independently radiology Chest x-ray: Shows no acute abnormality  CT angiogram of the chest abdomen pelvis is pending ECG/medicine tests: ordered and independent interpretation performed. Decision-making details documented in ED Course.    Details: Ordered the in the absence of cardiology and shows sinus rhythm, no STEMI or significant change when compared to prior Discussion of management or test interpretation with external provider(s): Dr. Delfin -hospitalist-spoke with her on the phone and she will with the patient for further workup and management  Risk OTC drugs. Prescription drug management. Drug therapy  requiring intensive monitoring for toxicity. Decision regarding hospitalization. Risk Details: CRITICAL CARE Performed by: Duwaine LITTIE Fusi   Total critical care time: 33 minutes  Critical care time was exclusive of separately billable procedures and treating other patients.  Critical care was necessary to treat or prevent imminent or life-threatening deterioration.  Critical care was time spent personally by me on the following activities: development of treatment plan with patient and/or surrogate as well as nursing, discussions with consultants, evaluation of patient's response to treatment, examination of patient, obtaining history from patient or surrogate, ordering and performing treatments and interventions, ordering and review of laboratory studies, ordering and review of radiographic studies, pulse oximetry and re-evaluation of patient's condition.   Critical Care Total time providing critical care: 33 minutes     Final diagnoses:  Respiratory distress  COPD exacerbation [facility])    ED Discharge Orders     None          Fusi Duwaine LITTIE, DO 10/14/24 2333

## 2024-10-14 NOTE — Progress Notes (Unsigned)
 Subjective:  Patient ID: Paul [name], male    DOB: 1946/01/05  Age: 77 y.o. MRN: [id number]  Chief Complaint  Patient presents with   Medical Management of Chronic Issues    HPI: Discussed the use of AI scribe software for clinical note transcription with the patient, who gave verbal consent to proceed.  History of Present Illness Paul [name] is a 78 year old male with COPD who presents for a three-month follow-up visit.  Dyspnea and hypoxemia - Increased dyspnea in cold weather - Oxygen  saturation drops into the seventies when transitioning from house to car - Oxygen  saturation improves to eighty-five after sitting in the car for a short period - Breathing is about the same overall compared to prior visits - No chest pain or hemoptysis - Symptoms improve in hot weather compared to cold weather  Cough and sputum production - Productive cough with green sputum - No fevers, chills, sweats, earaches, or sore throat  Chronic obstructive pulmonary disease management - Currently on a slow taper of prednisone , now at 25 mg - Previously using three nebulizer treatments daily - Recently hospitalized and discharged with a Z-Pak antibiotic, completed four to five days after returning home - Not currently on an antibiotic - He is concerned he needs more antibiotics  Fatigue - Experiences fatigue  Glycemic control - History of hyperglycemia during recent hospitalization - Not currently monitoring blood glucose levels - Last hemoglobin A1c was 6.3 on July 14 - Triglycerides were slightly elevated at that time  Gastrointestinal function - Bowel movements are regular, occurring daily without pain - No abdominal pain  Immunization status - Received influenza vaccination - Desires COVID-19 vaccination  Drug allergies - Allergic to Levaquin , which causes cardiac issues - No known allergy to penicillin       07/26/2024    8:34 AM 04/01/2024   10:35 AM 05/14/2023   11:23 AM  04/01/2023   11:15 AM 03/26/2023    8:54 AM  Depression screen PHQ 2/9  Decreased Interest 0 0 0 0 0  Down, Depressed, Hopeless 0 0 0 0 0  PHQ - 2 Score 0 0 0 0 0        05/20/2024    8:57 AM  Fall Risk   Falls in the past year? 0    Patient Care Team: [name], MD as PCP - General (Family Medicine) Jeffrie Oneil BROCKS, MD as PCP - Cardiology (Cardiology) Cindie Ole DASEN, MD as PCP - Electrophysiology (Cardiology) Brien Belvie FORBES, MD as Attending Physician (Pulmonary Disease) Darlean Ozell NOVAK, MD as Consulting Physician (Pulmonary Disease) [facility] as Registered Nurse [facility] and Palliative Medicine) Carlin Delon BROCKS, NP as Nurse Practitioner (Cardiology)   Review of Systems  Constitutional:  Positive for fatigue. Negative for chills and fever.  HENT:  Negative for congestion, ear pain and sore throat.   Respiratory:  Positive for cough and shortness of breath.   Cardiovascular:  Negative for chest pain.  Gastrointestinal:  Negative for abdominal pain, constipation, diarrhea, nausea and vomiting.  Psychiatric/Behavioral:  Negative for dysphoric mood.        No dysphoria    No current facility-administered medications on file prior to visit.   Current Outpatient Medications on File Prior to Visit  Medication Sig Dispense Refill   Accu-Chek Softclix Lancets lancets Check fasting sugar once daily. 100 each 3   albuterol  (PROVENTIL ) (2.5 MG/3ML) 0.083% nebulizer solution Take 3 mLs (2.5 mg total) by nebulization every 6 (six)  hours as needed for shortness of breath or wheezing. (Patient taking differently: Take 2.5 mg by nebulization daily.) 360 mL 5   ALPRAZolam  (XANAX ) 0.5 MG tablet Take 1 tablet (0.5 mg total) by mouth 2 (two) times daily as needed for anxiety. F41.1 (Patient taking differently: Take 0.5 mg by mouth daily as needed for anxiety. F41.1) 30 tablet 0   amLODipine  (NORVASC ) 5 MG tablet Take 1 tablet (5 mg total) by mouth daily. (Patient taking  differently: Take 5 mg by mouth at bedtime.) 30 tablet 1   Ascorbic Acid  (VITAMIN C ) 1000 MG tablet Take 1,000 mg by mouth daily.     aspirin  EC 81 MG tablet Take 1 tablet (81 mg total) by mouth daily. Swallow whole. 90 tablet 3   atorvastatin  (LIPITOR) 40 MG tablet TAKE 1 TABLET BY MOUTH EVERYDAY AT BEDTIME (Patient taking differently: Take 40 mg by mouth at bedtime.) 90 tablet 1   budesonide -glycopyrrolate -formoterol  (BREZTRI  AEROSPHERE) 160-9-4.8 MCG/ACT AERO inhaler Inhale 2 puffs into the lungs 2 (two) times daily. 3 each 4   CALCIUM  PO Take 1 tablet by mouth daily.     Cholecalciferol  (VITAMIN D3) 50 MCG (2000 UT) TABS Take 2,000 Units by mouth in the morning.     dapagliflozin  propanediol (FARXIGA ) 10 MG TABS tablet Take 1 tablet (10 mg total) by mouth daily. 90 tablet 4   denosumab  (PROLIA ) 60 MG/ML SOSY injection Inject 60 mg into the skin every 6 (six) months. 180 mL 0   digoxin  (LANOXIN ) 0.125 MG tablet TAKE 1 TABLET BY MOUTH DAILY 90 tablet 2   ferrous sulfate  325 (65 FE) MG tablet Take 325 mg by mouth 2 (two) times daily with a meal. (Patient taking differently: Take 325 mg by mouth daily with breakfast.)     furosemide  (LASIX ) 40 MG tablet Take 1 tablet (40 mg total) by mouth daily. 30 tablet 1   Glucose Blood (BLOOD GLUCOSE TEST STRIPS) STRP Use to test blood sugar up to twice daily. May substitute to any manufacturer covered by patient's insurance. E11.0 100 strip 3   Guaifenesin  (MUCINEX  MAXIMUM STRENGTH) 1200 MG TB12 Take 1,200 mg by mouth daily.     ipratropium-albuterol  (DUONEB) 0.5-2.5 (3) MG/3ML SOLN Take 3 mLs by nebulization 2 (two) times daily for 10 days. (Patient taking differently: Take 3 mLs by nebulization daily.) 90 mL 0   Lancets Misc. MISC Use to test blood sugar up to twice daily. May substitute to any manufacturer covered by patient's insurance. E11.0 100 each 3   levothyroxine  (SYNTHROID ) 25 MCG tablet Take 1 tablet (25 mcg total) by mouth daily. (Patient taking  differently: Take 25 mcg by mouth daily before breakfast.) 90 tablet 3   Melatonin 12 MG TABS Take 12 mg by mouth at bedtime.     Multiple Vitamin (MULTIVITAMIN WITH MINERALS) TABS tablet Take 1 tablet by mouth daily with breakfast.     NUCALA  100 MG/ML SOSY Inject 100 mg into the skin every 30 (thirty) days.     Omega-3 Fatty Acids  (FISH OIL) 1200 MG CAPS Take 1,200 mg by mouth in the morning and at bedtime.     OXYGEN  Inhale 2-3 L/min into the lungs continuous.     pantoprazole  (PROTONIX ) 40 MG tablet Take 1 tablet (40 mg total) by mouth 2 (two) times daily. (Patient taking differently: Take 40 mg by mouth 2 (two) times daily before a meal.) 180 tablet 3   predniSONE  (DELTASONE ) 10 MG tablet 1/2 to 2 tablets daily as directed (Patient  taking differently: Take 25 mg by mouth daily.) 100 tablet 1   Probiotic Product (PROBIOTIC DAILY) CAPS Take 1 capsule by mouth in the morning.     sertraline  (ZOLOFT ) 50 MG tablet Take 1 tablet (50 mg total) by mouth at bedtime. 90 tablet 1   Past Medical History:  Diagnosis Date   Anemia    Aortic atherosclerosis    Aspiration pneumonia due to inhalation of vomitus (HCC) 02/03/2022   Atrial fibrillation (HCC)    Back pain    COPD (chronic obstructive pulmonary disease) (HCC)    COPD exacerbation (HCC) 12/27/2021   COVID-19 12/24/2020   Diabetes mellitus without complication (HCC)    Dizziness 04/08/2022   Elevated PSA    GAD (generalized anxiety disorder)    GERD (gastroesophageal reflux disease)    High cholesterol    Hypertension    Lingular pneumonia 01/05/2021   See cxr 01/06/20 rx with zpak/ cefipime x 5 days and then developed purulent sputum again 01/12/21 so rx levcaquin 500 mg daily x 7 days then return for cxr before more    Lung nodule < 6cm on CT 05/29/2016   Osteoporosis    Oxygen  deficiency    Pneumonia    January 2022   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 Cindie with a 20 mm  Watchman FLX device   RSV bronchitis 01/27/2022   Vertebral compression fracture (HCC) 05/29/2019   T8 compression fracture noted on CT scan 05/28/2019 inpatient with chronic steroid dependent COPD - rx calcitonin nasal spray rx per PCP    Past Surgical History:  Procedure Laterality Date   CATARACT EXTRACTION Right    ELBOW SURGERY  07/2022   surgery on olecranon bursa. Dr. Alyse   IR KYPHO EA ADDL LEVEL THORACIC OR LUMBAR  11/28/2021   LEFT ATRIAL APPENDAGE OCCLUSION N/A 08/02/2021   Procedure: LEFT ATRIAL APPENDAGE OCCLUSION;  Surgeon: Wonda Sharper, MD;  Location: [facility] INVASIVE CV LAB;  Service: Cardiovascular;  Laterality: N/A;   SKIN SURGERY     shoulders and chest   TEE WITHOUT CARDIOVERSION N/A 08/02/2021   Procedure: TRANSESOPHAGEAL ECHOCARDIOGRAM (TEE);  Surgeon: Wonda Sharper, MD;  Location[facility] INVASIVE CV LAB;  Service: Cardiovascular;  Laterality: N/A;   TEE WITHOUT CARDIOVERSION N/A 09/13/2021   Procedure: TRANSESOPHAGEAL ECHOCARDIOGRAM (TEE);  Surgeon: Francyne Headland, MD;  Location: [facility] ENDOSCOPY;  Service: Cardiovascular;  Laterality: N/A;    Family History  Problem Relation Age of Onset   Heart disease Brother    Heart disease Mother    Heart disease Father    Cancer Paternal Uncle    Social History   Socioeconomic History   Marital status: Married    Spouse name: Not on file   Number of children: 3   Years of education: Not on file   Highest education level: 10th grade  Occupational History   Occupation: retired    Comment: truck hospital doctor  Tobacco Use   Smoking status: Former    Current packs/day: 0.00    Average packs/day: 2.0 packs/day for 52.0 years (104.0 ttl pk-yrs)    Types: Cigarettes    Start date: 02/03/1957    Quit date: 02/03/2009    Years since quitting: 15.7   Smokeless tobacco: Never   Tobacco comments:    Counseled to remain smoke free  Vaping Use   Vaping status: Never Used  Substance and Sexual Activity   Alcohol use: Not Currently     Alcohol/week:  0.0 standard drinks of alcohol    Comment: occ   Drug use: No   Sexual activity: Not on file  Other Topics Concern   Not on file  Social History Narrative   Originally from [place]. Previously worked driving a surveyor, minerals. No pets currently. No mold exposure. 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       wears sunscreen, brushes and flosses daily, see's dentist bi-annually, has smoke/carbon monoxide detectors, wears a seatbelt and practices gun safety      Social Drivers of Health   Financial Resource Strain: Low Risk  (07/19/2023)   Overall Financial Resource Strain (CARDIA)    Difficulty of Paying Living Expenses: Not hard at all  Food Insecurity: No Food Insecurity (10/15/2024)   Hunger Vital Sign    Worried About Running Out of Food in the Last Year: Never true    Ran Out of Food in the Last Year: Never true  Transportation Needs: No Transportation Needs (10/15/2024)   PRAPARE - Administrator, Civil Service (Medical): No    Lack of Transportation (Non-Medical): No  Physical Activity: Insufficiently Active (07/19/2023)   Exercise Vital Sign    Days of Exercise per Week: 3 days    Minutes of Exercise per Session: 30 min  Stress: No Stress Concern Present (07/19/2023)   [employer] of Occupational Health - Occupational Stress Questionnaire    Feeling of Stress : Not at all  Social Connections: Socially Integrated (10/15/2024)   Social Connection and Isolation Panel    Frequency of Communication with Friends and Family: More than three times a week    Frequency of Social Gatherings with Friends and Family: More than three times a week    Attends Religious Services: More than 4 times per year    Active Member of [employer] or Organizations: Yes    Attends Engineer, Structural: More than 4 times per year    Marital Status: Married    Objective:  BP 134/62   Pulse 92   Temp 98  F (36.7 C)   Ht 5' 5 (1.651 m)   Wt 157 lb (71.2 kg)   SpO2 96% Comment: 2L O2  BMI 26.13 kg/m      10/17/2024   12:33 PM 10/17/2024   10:22 AM 10/17/2024    7:52 AM  BP/Weight  Systolic BP 146 171 145  Diastolic BP 59 63 51    Physical Exam Constitutional:      Appearance: Normal appearance.  HENT:     Right Ear: Tympanic membrane, ear canal and external ear normal.     Left Ear: Tympanic membrane, ear canal and external ear normal.     Nose: Nose normal. No congestion or rhinorrhea.     Mouth/Throat:     Mouth: Mucous membranes are moist.     Pharynx: No oropharyngeal exudate or posterior oropharyngeal erythema.  Cardiovascular:     Rate and Rhythm: Normal rate and regular rhythm.     Heart sounds: Normal heart sounds.  Pulmonary:     Effort: No respiratory distress.     Breath sounds: Wheezing present. No rhonchi or rales.     Comments: Poor exchange Oxygen  PNC 2-3 L.  Abdominal:     General: Bowel sounds are normal.     Palpations: Abdomen is soft.     Tenderness: There is no abdominal tenderness.  Neurological:  Mental Status: He is alert and oriented to person, place, and time.  Psychiatric:        Mood and Affect: Mood normal.        Behavior: Behavior normal.         Lab Results  Component Value Date   WBC 12.7 (H) 10/16/2024   HGB 12.4 (L) 10/16/2024   HCT 41.5 10/16/2024   PLT 254 10/16/2024   GLUCOSE 141 (H) 10/16/2024   CHOL 158 07/05/2024   TRIG 199 (H) 07/05/2024   HDL 57 07/05/2024   LDLCALC 68 07/05/2024   ALT 71 (H) 10/04/2024   AST 28 10/04/2024   NA 139 10/16/2024   K 4.1 10/16/2024   CL 97 (L) 10/16/2024   CREATININE 0.90 10/16/2024   BUN 26 (H) 10/16/2024   CO2 27 10/16/2024   TSH 0.495 09/08/2024   INR 0.9 01/25/2023   HGBA1C 6.8 10/14/2024    Results for orders placed or performed in visit on 10/14/24  POCT glycosylated hemoglobin (Hb A1C)   Collection Time: 10/14/24 10:27 AM  Result Value Ref Range    Hemoglobin A1C     HbA1c POC (<> result, manual entry) 6.8 4.0 - 5.6 %   HbA1c, POC (prediabetic range)     HbA1c, POC (controlled diabetic range)    POCT Lipid Panel   Collection Time: 10/14/24 10:28 AM  Result Value Ref Range   TC 197    HDL 69    TRG 178    LDL 92    Non-HDL 128    TC/HDL    .  Assessment & Plan:   Assessment & Plan Hypertension associated with diabetes (HCC) Well controlled.  No changes to medicines.   Norvasc  2.5 mg daily, Lasix  40 mg 40 MG TWICE DAILY. Continue to work on eating a healthy diet and exercise.   Type 2 diabetes mellitus, not monitoring blood sugars at home. Recent high blood sugar likely due to steroids. Last A1c 6.3, indicating good control. - Order A1c test. Worsened to 6.8.  Orders:   POCT glycosylated hemoglobin (Hb A1C)  COPD with acute exacerbation (HCC) COPD exacerbation likely due to lower respiratory infection. Wheezing and hypoxia noted with cold exposure. Green sputum suggests ongoing infection. Allergic to Levaquin , Augmentin  chosen. - Prescribe Augmentin  and send prescription to [facility]. - Continue slow taper of prednisone . - Follow up with pulmonologist on November 6.    Mixed hyperlipidemia Hyperlipidemia with previously elevated triglycerides. - Order cholesterol panel. At goal except triglycerides a little high.   Orders:   POCT Lipid Panel  Severe persistent asthma with acute exacerbation (HCC) Wheezing and hypoxia noted with cold exposure. Green sputum suggests ongoing infection. Allergic to Levaquin , Augmentin  chosen. - Prescribe Augmentin  and send prescription to [facility]. - Sent airsupra.  - Continue slow taper of prednisone . - Follow up with pulmonologist on November 6.    Body mass index is 26.13 kg/m.   Meds ordered this encounter  Medications   amoxicillin -clavulanate (AUGMENTIN ) 875-125 MG tablet    Sig: Take 1 tablet by mouth 2 (two) times daily.    Dispense:  20 tablet    Refill:  0   DISCONTD:  Albuterol -Budesonide  (AIRSUPRA) 90-80 MCG/ACT AERO    Sig: Inhale 2 Inhalations into the lungs 4 (four) times daily as needed (wheezing).    Dispense:  10.7 g    Refill:  2    Orders Placed This Encounter  Procedures   POCT Lipid Panel   POCT glycosylated hemoglobin (  Hb A1C)     I,Marla I Leal-Borjas,acting as a scribe for [name] Free, MD.,have documented all relevant documentation on the behalf of [name] Free, MD,as directed by  [name] Free, MD while in the presence of [name] Free, MD.   Follow-up: Return in about 3 months (around 01/14/2025) for chronic follow up.  An After Visit Summary was printed and given to the patient.  I attest that I have reviewed this visit and agree with the plan scribed by my staff.   [name] Free, MD [facility] [phone]

## 2024-10-14 NOTE — Assessment & Plan Note (Addendum)
 Wheezing and hypoxia noted with cold exposure. Green sputum suggests ongoing infection. Allergic to Levaquin , Augmentin  chosen. - Prescribe Augmentin  and [facility]. - Sent airsupra.  - Continue slow taper of prednisone . - Follow up with pulmonologist on November 6.

## 2024-10-14 NOTE — ED Triage Notes (Signed)
 Pt reports increased SOB for the past week. Recently seen here 2wks ago for same problem. Pt reports O2 saturation dropping into 70s at home while ambulating on 2L. Pt presents tachypneic with increased work of breathing, two word answers only. Admitted for COPD exacerbation 2 weeks ago. Denies any sick contacts. Normally wears 2L [place] at home. Today needed to go up to 3L.

## 2024-10-15 DIAGNOSIS — I5032 Chronic diastolic (congestive) heart failure: Secondary | ICD-10-CM | POA: Diagnosis not present

## 2024-10-15 DIAGNOSIS — E1169 Type 2 diabetes mellitus with other specified complication: Secondary | ICD-10-CM | POA: Diagnosis not present

## 2024-10-15 DIAGNOSIS — E039 Hypothyroidism, unspecified: Secondary | ICD-10-CM

## 2024-10-15 DIAGNOSIS — E782 Mixed hyperlipidemia: Secondary | ICD-10-CM | POA: Diagnosis not present

## 2024-10-15 DIAGNOSIS — I480 Paroxysmal atrial fibrillation: Secondary | ICD-10-CM | POA: Diagnosis not present

## 2024-10-15 DIAGNOSIS — G4733 Obstructive sleep apnea (adult) (pediatric): Secondary | ICD-10-CM

## 2024-10-15 DIAGNOSIS — J441 Chronic obstructive pulmonary disease with (acute) exacerbation: Secondary | ICD-10-CM | POA: Diagnosis not present

## 2024-10-15 DIAGNOSIS — J9621 Acute and chronic respiratory failure with hypoxia: Secondary | ICD-10-CM | POA: Diagnosis not present

## 2024-10-15 DIAGNOSIS — I10 Essential (primary) hypertension: Secondary | ICD-10-CM

## 2024-10-15 LAB — GLUCOSE, CAPILLARY: Glucose-Capillary: 216 mg/dL — ABNORMAL HIGH (ref 70–99)

## 2024-10-15 LAB — TROPONIN T, HIGH SENSITIVITY: Troponin T High Sensitivity: <15 ng/L (ref 0–19)

## 2024-10-15 MED ORDER — SODIUM CHLORIDE 0.9% FLUSH
3.0000 mL | Freq: Two times a day (BID) | INTRAVENOUS | Status: DC
  Administered 2024-10-15 – 2024-10-19 (×7): 3 mL via INTRAVENOUS

## 2024-10-15 MED ORDER — INSULIN ASPART 100 UNIT/ML IJ SOLN
0.0000 [IU] | Freq: Every day | INTRAMUSCULAR | Status: DC
  Administered 2024-10-15: 2 [IU] via SUBCUTANEOUS

## 2024-10-15 MED ORDER — AMLODIPINE BESYLATE 5 MG PO TABS
5.0000 mg | ORAL_TABLET | Freq: Every day | ORAL | Status: DC
Start: 2024-10-15 — End: 2024-10-19
  Administered 2024-10-15 – 2024-10-19 (×5): 5 mg via ORAL
  Filled 2024-10-15 (×5): qty 1

## 2024-10-15 MED ORDER — LORATADINE 10 MG PO TABS
10.0000 mg | ORAL_TABLET | Freq: Every morning | ORAL | Status: DC
  Administered 2024-10-15 – 2024-10-19 (×5): 10 mg via ORAL
  Filled 2024-10-15 (×5): qty 1

## 2024-10-15 MED ORDER — ALBUTEROL SULFATE (2.5 MG/3ML) 0.083% IN NEBU: 2.5000 mg | INHALATION_SOLUTION | RESPIRATORY_TRACT | Status: DC | PRN

## 2024-10-15 MED ORDER — ALPRAZOLAM 0.5 MG PO TABS
0.5000 mg | ORAL_TABLET | Freq: Every day | ORAL | Status: DC
Start: 2024-10-15 — End: 2024-10-19
  Administered 2024-10-15 – 2024-10-18 (×4): 0.5 mg via ORAL
  Filled 2024-10-15 (×4): qty 1

## 2024-10-15 MED ORDER — SODIUM CHLORIDE 0.9 % IV SOLN
2.0000 g | Freq: Every day | INTRAVENOUS | Status: DC
  Administered 2024-10-15 – 2024-10-19 (×5): 2 g via INTRAVENOUS
  Filled 2024-10-15 (×5): qty 20

## 2024-10-15 MED ORDER — BUDESONIDE 0.5 MG/2ML IN SUSP
0.5000 mg | Freq: Two times a day (BID) | RESPIRATORY_TRACT | Status: DC
  Administered 2024-10-15 – 2024-10-17 (×4): 0.5 mg via RESPIRATORY_TRACT
  Filled 2024-10-15 (×4): qty 2

## 2024-10-15 MED ORDER — ENOXAPARIN SODIUM 40 MG/0.4ML IJ SOSY
40.0000 mg | PREFILLED_SYRINGE | Freq: Every day | INTRAMUSCULAR | Status: DC
  Administered 2024-10-16 – 2024-10-19 (×4): 40 mg via SUBCUTANEOUS
  Filled 2024-10-15 (×4): qty 0.4

## 2024-10-15 MED ORDER — ATORVASTATIN CALCIUM 40 MG PO TABS
40.0000 mg | ORAL_TABLET | Freq: Every day | ORAL | Status: DC
  Administered 2024-10-15 – 2024-10-18 (×4): 40 mg via ORAL
  Filled 2024-10-15 (×5): qty 1

## 2024-10-15 MED ORDER — FUROSEMIDE 40 MG PO TABS
40.0000 mg | ORAL_TABLET | Freq: Every day | ORAL | Status: DC
  Administered 2024-10-15 – 2024-10-19 (×5): 40 mg via ORAL
  Filled 2024-10-15 (×5): qty 1

## 2024-10-15 MED ORDER — PANTOPRAZOLE SODIUM 40 MG PO TBEC
40.0000 mg | DELAYED_RELEASE_TABLET | Freq: Two times a day (BID) | ORAL | Status: DC
  Administered 2024-10-16 – 2024-10-19 (×7): 40 mg via ORAL
  Filled 2024-10-15 (×7): qty 1

## 2024-10-15 MED ORDER — ASPIRIN 81 MG PO TBEC
81.0000 mg | DELAYED_RELEASE_TABLET | Freq: Every day | ORAL | Status: DC
  Administered 2024-10-15 – 2024-10-19 (×5): 81 mg via ORAL
  Filled 2024-10-15 (×5): qty 1

## 2024-10-15 MED ORDER — GUAIFENESIN ER 600 MG PO TB12
1200.0000 mg | ORAL_TABLET | Freq: Once | ORAL | Status: DC
Start: 2024-10-15 — End: 2024-10-17

## 2024-10-15 MED ORDER — PREDNISONE 20 MG PO TABS: 40.0000 mg | ORAL_TABLET | Freq: Every day | ORAL | Status: DC

## 2024-10-15 MED ORDER — LEVOTHYROXINE SODIUM 50 MCG PO TABS
25.0000 ug | ORAL_TABLET | Freq: Every day | ORAL | Status: DC
  Administered 2024-10-15 – 2024-10-19 (×5): 25 ug via ORAL
  Filled 2024-10-15 (×5): qty 1

## 2024-10-15 MED ORDER — INSULIN ASPART 100 UNIT/ML IJ SOLN
0.0000 [IU] | Freq: Three times a day (TID) | INTRAMUSCULAR | Status: DC
  Administered 2024-10-16: 2 [IU] via SUBCUTANEOUS
  Administered 2024-10-16: 3 [IU] via SUBCUTANEOUS
  Administered 2024-10-16: 5 [IU] via SUBCUTANEOUS
  Administered 2024-10-17: 7 [IU] via SUBCUTANEOUS
  Administered 2024-10-18: 1 [IU] via SUBCUTANEOUS
  Administered 2024-10-18: 3 [IU] via SUBCUTANEOUS
  Administered 2024-10-19: 2 [IU] via SUBCUTANEOUS

## 2024-10-15 MED ORDER — IPRATROPIUM-ALBUTEROL 0.5-2.5 (3) MG/3ML IN SOLN
3.0000 mL | Freq: Once | RESPIRATORY_TRACT | Status: AC
  Administered 2024-10-15: 3 mL via RESPIRATORY_TRACT
  Filled 2024-10-15: qty 3

## 2024-10-15 MED ORDER — ACETAMINOPHEN 325 MG PO TABS
650.0000 mg | ORAL_TABLET | Freq: Four times a day (QID) | ORAL | Status: DC | PRN
Start: 2024-10-15 — End: 2024-10-19

## 2024-10-15 MED ORDER — ACETAMINOPHEN 650 MG RE SUPP: 650.0000 mg | Freq: Four times a day (QID) | RECTAL | Status: DC | PRN

## 2024-10-15 MED ORDER — METHYLPREDNISOLONE SODIUM SUCC 40 MG IJ SOLR
40.0000 mg | Freq: Two times a day (BID) | INTRAMUSCULAR | Status: AC
  Administered 2024-10-15 – 2024-10-16 (×2): 40 mg via INTRAVENOUS
  Filled 2024-10-15 (×2): qty 1

## 2024-10-15 MED ORDER — MELATONIN 3 MG PO TABS
12.0000 mg | ORAL_TABLET | Freq: Every day | ORAL | Status: DC
  Administered 2024-10-15 – 2024-10-18 (×4): 12 mg via ORAL
  Filled 2024-10-15 (×4): qty 4

## 2024-10-15 MED ORDER — DIGOXIN 125 MCG PO TABS
0.0625 mg | ORAL_TABLET | Freq: Every day | ORAL | Status: DC
  Administered 2024-10-17 – 2024-10-19 (×3): 0.0625 mg via ORAL
  Filled 2024-10-15 (×3): qty 1

## 2024-10-15 MED ORDER — SERTRALINE HCL 50 MG PO TABS
50.0000 mg | ORAL_TABLET | Freq: Every day | ORAL | Status: DC
  Administered 2024-10-15 – 2024-10-18 (×4): 50 mg via ORAL
  Filled 2024-10-15 (×4): qty 1

## 2024-10-15 MED ORDER — DAPAGLIFLOZIN PROPANEDIOL 10 MG PO TABS
10.0000 mg | ORAL_TABLET | Freq: Every day | ORAL | Status: DC
  Administered 2024-10-16 – 2024-10-19 (×4): 10 mg via ORAL
  Filled 2024-10-15 (×4): qty 1

## 2024-10-15 MED ORDER — IPRATROPIUM-ALBUTEROL 0.5-2.5 (3) MG/3ML IN SOLN
3.0000 mL | Freq: Four times a day (QID) | RESPIRATORY_TRACT | Status: DC
  Administered 2024-10-15 – 2024-10-16 (×3): 3 mL via RESPIRATORY_TRACT
  Filled 2024-10-15 (×4): qty 3

## 2024-10-15 MED ORDER — DIGOXIN 125 MCG PO TABS
125.0000 ug | ORAL_TABLET | Freq: Every day | ORAL | Status: DC
  Administered 2024-10-15: 125 ug via ORAL
  Filled 2024-10-15: qty 1

## 2024-10-15 MED ORDER — ARFORMOTEROL TARTRATE 15 MCG/2ML IN NEBU
15.0000 ug | INHALATION_SOLUTION | Freq: Two times a day (BID) | RESPIRATORY_TRACT | Status: DC
  Administered 2024-10-15 – 2024-10-17 (×4): 15 ug via RESPIRATORY_TRACT
  Filled 2024-10-15 (×4): qty 2

## 2024-10-15 MED ORDER — FERROUS SULFATE 325 (65 FE) MG PO TABS
325.0000 mg | ORAL_TABLET | Freq: Every day | ORAL | Status: DC
  Administered 2024-10-15 – 2024-10-19 (×5): 325 mg via ORAL
  Filled 2024-10-15 (×5): qty 1

## 2024-10-15 NOTE — H&P (Addendum)
 History and Physical    Patient: Paul [name] FMW:[id number] DOB: 02-Apr-1946 DOA: 10/14/2024 DOS: the patient was seen and examined on 10/15/2024 PCP: [name], MD  Patient coming from: Home  Chief Complaint:  Chief Complaint  Patient presents with   Shortness of Breath   HPI: Paul [name] is a 78 y.o. male with medical history significant of COPD, OSA, chronic respiratory failure, chronic HFpEF, hypertension, type 2 diabetes, paroxysmal A-fib with Watchman device, hypothyroidism, anxiety. Recent hospital admission 10/6-10/9 for COPD exacerbation. Patient presents back to the hospital hospital with complaints of worsening shortness of breath.  He has been experiencing worsening difficulty breathing, with a significant exacerbation yesterday, leading to a drop in oxygen  saturation to 73%. He typically uses 2 liters of oxygen , primarily at night, but has recently required increased oxygen  support. This is his third hospital admission since September 15th for his breathing.  His oxygen  levels drop significantly when he stands or walks, necessitating adjustments in his oxygen  flow rate. He uses his inhaler approximately every two hours and nebulizer treatments every two to four hours.  He produces yellow-green sputum and experiences back pain, which he associates with his lung issues. He has a history of kyphoplasty for vertebral compression fractures and previous rib fractures.  He has prediabetes, which is affected by his use of prednisone , leading to elevated blood sugar levels.  Cold air exposure seems to trigger his breathing difficulties. No fever reported.  His initial oxygen  saturation in the 70s at home while ambulating on 2 L [place].   In the emergency department patient was placed on BiPAP due to tachypnea/increased work of breathing. Labs showing WBC count 11.4, hemoglobin 12.8 (stable), troponin negative, pro BNP 262, COVID/influenza/RSV PCR pending. Chest x-ray showing no  acute cardiopulmonary process. Patient was given Solu-Medrol  and 25 mg IV and DuoNeb.    Review of Systems: As mentioned in the history of present illness. All other systems reviewed and are negative. Past Medical History:  Diagnosis Date   Anemia    Aortic atherosclerosis    Aspiration pneumonia due to inhalation of vomitus (HCC) 02/03/2022   Atrial fibrillation (HCC)    Back pain    COPD (chronic obstructive pulmonary disease) (HCC)    COPD exacerbation (HCC) 12/27/2021   COVID-19 12/24/2020   Diabetes mellitus without complication (HCC)    Dizziness 04/08/2022   Elevated PSA    GAD (generalized anxiety disorder)    GERD (gastroesophageal reflux disease)    High cholesterol    Hypertension    Lingular pneumonia 01/05/2021   See cxr 01/06/20 rx with zpak/ cefipime x 5 days and then developed purulent sputum again 01/12/21 so rx levcaquin 500 mg daily x 7 days then return for cxr before more    Lung nodule < 6cm on CT 05/29/2016   Osteoporosis    Oxygen  deficiency    Pneumonia    January 2022   Presence of Watchman left atrial appendage closure device 08/02/2021   s/p LAAO by Dr. Cindie with a 20 mm Watchman FLX device   RSV bronchitis 01/27/2022   Vertebral compression fracture (HCC) 05/29/2019   T8 compression fracture noted on CT scan 05/28/2019 inpatient with chronic steroid dependent COPD - rx calcitonin nasal spray rx per PCP    Past Surgical History:  Procedure Laterality Date   CATARACT EXTRACTION Right    ELBOW SURGERY  07/2022   surgery on olecranon bursa. Dr. Alyse   IR KYPHO EA ADDL LEVEL THORACIC OR LUMBAR  11/28/2021   LEFT ATRIAL APPENDAGE OCCLUSION N/A 08/02/2021   Procedure: LEFT ATRIAL APPENDAGE OCCLUSION;  Surgeon: Wonda Sharper, MD;  Location: [facility] INVASIVE CV LAB;  Service: Cardiovascular;  Laterality: N/A;   SKIN SURGERY     shoulders and chest   TEE WITHOUT CARDIOVERSION N/A 08/02/2021   Procedure: TRANSESOPHAGEAL ECHOCARDIOGRAM (TEE);  Surgeon:  Wonda Sharper, MD;  Location: [facility] INVASIVE CV LAB;  Service: Cardiovascular;  Laterality: N/A;   TEE WITHOUT CARDIOVERSION N/A 09/13/2021   Procedure: TRANSESOPHAGEAL ECHOCARDIOGRAM (TEE);  Surgeon: Francyne Headland, MD;  Location: [facility] ENDOSCOPY;  Service: Cardiovascular;  Laterality: N/A;   Social History:  reports that he quit smoking about 15 years ago. His smoking use included cigarettes. He started smoking about 67 years ago. He has a 104 pack-year smoking history. He has never used smokeless tobacco. He reports that he does not currently use alcohol. He reports that he does not use drugs.  Allergies  Allergen Reactions   Tape Other (See Comments)    PLEASE USE COBAN WRAP IN LIEU OF TAPE, as it pulls off the skin!!   Daliresp  [Roflumilast ] Diarrhea and Nausea And Vomiting   Levaquin  [Levofloxacin ] Palpitations and Other (See Comments)    Made the B/P fluctuate and heart raced   Alendronate Anxiety and Other (See Comments)    Jittery/nervous    Family History  Problem Relation Age of Onset   Heart disease Brother    Heart disease Mother    Heart disease Father    Cancer Paternal Uncle     Prior to Admission medications   Medication Sig Start Date End Date Taking? Authorizing Provider  Accu-Chek Softclix Lancets lancets Check fasting sugar once daily. 08/05/23   Cox, [name], MD  albuterol  (PROVENTIL ) (2.5 MG/3ML) 0.083% nebulizer solution Take 3 mLs (2.5 mg total) by nebulization every 6 (six) hours as needed for shortness of breath or wheezing. Patient taking differently: Take 2.5 mg by nebulization daily. 10/23/22   Parrett, Madelin RAMAN, NP  albuterol  (VENTOLIN  HFA) 108 (90 Base) MCG/ACT inhaler Inhale 1-2 puffs into the lungs every 6 (six) hours as needed for wheezing or shortness of breath. 10/15/24   Sirivol, Mamatha, MD  ALPRAZolam  (XANAX ) 0.5 MG tablet Take 1 tablet (0.5 mg total) by mouth 2 (two) times daily as needed for anxiety. F41.1 Patient taking differently: Take 0.5 mg  by mouth at bedtime. F41.1 09/24/24   Nicholaus Credit, PA-C  amLODipine  (NORVASC ) 5 MG tablet Take 1 tablet (5 mg total) by mouth daily. 09/12/24   Fausto Burnard LABOR, DO  amoxicillin -clavulanate (AUGMENTIN ) 875-125 MG tablet Take 1 tablet by mouth 2 (two) times daily. 10/14/24   CoxAbigail, MD  Ascorbic Acid  (VITAMIN C ) 1000 MG tablet Take 1,000 mg by mouth daily.    [provider]  aspirin  EC 81 MG tablet Take 1 tablet (81 mg total) by mouth daily. Swallow whole. 01/28/22   McDaniel, Jill D, NP  atorvastatin  (LIPITOR) 40 MG tablet TAKE 1 TABLET BY MOUTH EVERYDAY AT BEDTIME Patient taking differently: Take 40 mg by mouth at bedtime. 07/22/24   CoxAbigail, MD  budesonide -glycopyrrolate -formoterol  (BREZTRI  AEROSPHERE) 160-9-4.8 MCG/ACT AERO inhaler Inhale 2 puffs into the lungs 2 (two) times daily. 07/06/24   CoxAbigail, MD  CALCIUM  PO Take 1 tablet by mouth daily.    [provider]  Cholecalciferol  (VITAMIN D3) 50 MCG (2000 UT) TABS Take 2,000 Units by mouth in the morning.    [provider]  dapagliflozin  propanediol (FARXIGA ) 10 MG TABS  tablet Take 1 tablet (10 mg total) by mouth daily. 07/06/24   CoxAbigail, MD  denosumab  (PROLIA ) 60 MG/ML SOSY injection Inject 60 mg into the skin every 6 (six) months. 03/11/23   Cox, [name], MD  digoxin  (LANOXIN ) 0.125 MG tablet TAKE 1 TABLET BY MOUTH DAILY 04/26/24   Jeffrie Oneil BROCKS, MD  ferrous sulfate  325 (65 FE) MG tablet Take 325 mg by mouth 2 (two) times daily with a meal. Patient taking differently: Take 325 mg by mouth daily with breakfast.    [provider]  furosemide  (LASIX ) 40 MG tablet Take 1 tablet (40 mg total) by mouth daily. 09/12/24   Fausto Sor A, DO  Glucose Blood (BLOOD GLUCOSE TEST STRIPS) STRP Use to test blood sugar up to twice daily. May substitute to any manufacturer covered by patient's insurance. E11.0 07/14/23   CoxAbigail, MD  Guaifenesin  (MUCINEX  MAXIMUM STRENGTH) 1200 MG TB12 Take 1,200 mg by  mouth in the morning and at bedtime.    [provider]  ipratropium-albuterol  (DUONEB) 0.5-2.5 (3) MG/3ML SOLN Take 3 mLs by nebulization 2 (two) times daily for 10 days. Patient taking differently: Take 3 mLs by nebulization daily. 09/11/24 10/14/24  Fausto Sor LABOR, DO  Lancets Misc. MISC Use to test blood sugar up to twice daily. May substitute to any manufacturer covered by patient's insurance. E11.0 07/14/23   CoxAbigail, MD  levothyroxine  (SYNTHROID ) 25 MCG tablet Take 1 tablet (25 mcg total) by mouth daily. Patient taking differently: Take 25 mcg by mouth daily before breakfast. 05/21/24   Cox, [name], MD  loratadine  (CLARITIN ) 10 MG tablet Take 10 mg by mouth in the morning.    [provider]  Melatonin 12 MG TABS Take 12 mg by mouth at bedtime.    [provider]  Multiple Vitamin (MULTIVITAMIN WITH MINERALS) TABS tablet Take 1 tablet by mouth daily with breakfast.    [provider]  NUCALA  100 MG/ML SOSY Inject 100 mg into the skin every 30 (thirty) days.    [provider]  Omega-3 Fatty Acids  (FISH OIL) 1200 MG CAPS Take 1,200 mg by mouth at bedtime.    [provider]  OXYGEN  Inhale 2-2.5 L/min into the lungs See admin instructions. 2-2.5 L/min at bedtime and as needed during the day and/or with  exercise/activity- for shortness of breath    [provider]  pantoprazole  (PROTONIX ) 40 MG tablet Take 1 tablet (40 mg total) by mouth 2 (two) times daily. Patient taking differently: Take 40 mg by mouth 2 (two) times daily before a meal. 05/20/24   Parrett, Madelin RAMAN, NP  predniSONE  (DELTASONE ) 10 MG tablet 1/2 to 2 tablets daily as directed 10/05/24   Wert, Michael B, MD  predniSONE  (DELTASONE ) 20 MG tablet Take 2 tablets (40 mg total) by mouth daily with breakfast. 10/01/24   Rashid, Farhan, MD  Probiotic Product (PROBIOTIC DAILY) CAPS Take 1 capsule by mouth in the morning.    [provider]  sertraline  (ZOLOFT ) 50  MG tablet Take 1 tablet (50 mg total) by mouth at bedtime. 10/05/24   [name], MD    Physical Exam: Vitals:   10/15/24 0800 10/15/24 0900 10/15/24 1000 10/15/24 1100  BP: (!) 160/83 (!) 168/74 (!) 160/65 (!) 129/102  Pulse: 81 80 85 74  Resp: (!) 23 (!) 32 (!) 22   Temp:    97.9 F (36.6 C)  TempSrc:    Oral  SpO2: 100% 100% 100% 100%  Weight:  70.3 kg  Height:    5' 5 (1.651 m)  Exam  Constitutional: NAD, calm, comfortable Eyes: PERRL, lids and conjunctivae normal ENMT: Mucous membranes are moist. Posterior pharynx clear of any exudate or lesions.Normal dentition.  Neck: normal, supple, no masses, no thyromegaly Respiratory: clear to auscultation bilaterally, no wheezing, no crackles. Normal respiratory effort. No accessory muscle use.  Cardiovascular: Regular rate and rhythm, no murmurs / rubs / gallops. No extremity edema. 2+ pedal pulses. No carotid bruits.  Abdomen: no tenderness, no masses palpated. No hepatosplenomegaly. Bowel sounds positive.  Musculoskeletal: no clubbing / cyanosis. No joint deformity upper and lower extremities. Good ROM, no contractures. Normal muscle tone.  Skin: no rashes, lesions, ulcers. No induration Neurologic: CN 2-12 grossly intact. Sensation intact, DTR normal. Strength 5/5 in all 4.  Psychiatric: Normal judgment and insight. Alert and oriented x 3. Normal mood.   Data Reviewed:  Reviewed labs, imaging, and pertinent records as documented.   Assessment and Plan:  Acute on chronic respiratory failure with hypoxia Secondary to COPD exacerbation Patient presents with progressively worsening shortness of breath and productive cough.  Reported O2 saturations dropping into the 70s on home 2 L of oxygen .  In the ED patient was placed on BiPAP temporarily but has since been able to be weaned to chest x-ray showed no acute abnormality.  On physical exam patient with expiratory wheezes appreciated and decreased aeration. - Admit to a telemetry  bed - Continue oxygen  to maintain O2 saturations greater than 90% - Incentive spirometry and flutter valve - DuoNebs 4 times daily and albuterol  nebs as needed - Brovana  and budesonide  nebs twice daily - Solu-Medrol  40 mg IV twice daily times x 2 doses then transition to prednisone  in a.m. - Rocephin  IV - Mucinex  - Physical therapy to evaluate in a.m.  Diastolic congestive heart failure Chronic. Patient appears to be euvolemic at this time.  BNP 262 which appears similar to prior hospitalization.  Last EF noted to be 65 to 70% with grade 1 diastolic dysfunction back in 01/26/23. - Continue Lasix   Paroxysmal atrial fibrillation Patient status post Watchman device.  Digoxin  level was noted to be 1. - Appreciate pharmacy to adjusted dose of digoxin   Controlled diabetes mellitus type 2, without long-term use of insulin  with hyperlipidemia Hemoglobin A1c noted to be 6.8. - Hypoglycemia protocols - CBGs before every meal with sensitive SSI - Adjust insulin  regimen as deemed medically appropriate  Essential hypertension - Continue amlodipine   Hypothyroidism - Continue levothyroxine   Generalized anxiety disorder - Continue Zoloft  and Xanax  as needed  OSA - Continue CPAP nightly  DVT prophylaxis: Lovenox  Advance Care Planning:   Code Status: Full Code   Consults: None Family Communication: Patient's wife updated at bedside  Severity of Illness: The appropriate patient status for this patient is OBSERVATION. Observation status is judged to be reasonable and necessary in order to provide the required intensity of service to ensure the patient's safety. The patient's presenting symptoms, physical exam findings, and initial radiographic and laboratory data in the context of their medical condition is felt to place them at decreased risk for further clinical deterioration. Furthermore, it is anticipated that the patient will be medically stable for discharge from the hospital within 2  midnights of admission.   Author: Maximino DELENA Sharps, MD 10/15/2024 11:32 AM  For on call review [email or web address].

## 2024-10-15 NOTE — ED Notes (Signed)
 Patient wanted a break off Non-invasive ventilation so he could drink and eat something.  Removed patient from NIV and placed patient on 3 liter nasal cannula.  Patient tolerating well at this time.

## 2024-10-15 NOTE — ED Notes (Signed)
 Pharmacy was contacted to review the day medications for the patient and to send over whatever is needed.

## 2024-10-16 DIAGNOSIS — I480 Paroxysmal atrial fibrillation: Secondary | ICD-10-CM | POA: Diagnosis not present

## 2024-10-16 DIAGNOSIS — Z888 Allergy status to other drugs, medicaments and biological substances status: Secondary | ICD-10-CM | POA: Diagnosis not present

## 2024-10-16 DIAGNOSIS — Z8249 Family history of ischemic heart disease and other diseases of the circulatory system: Secondary | ICD-10-CM | POA: Diagnosis not present

## 2024-10-16 DIAGNOSIS — Z79899 Other long term (current) drug therapy: Secondary | ICD-10-CM | POA: Diagnosis not present

## 2024-10-16 DIAGNOSIS — Z794 Long term (current) use of insulin: Secondary | ICD-10-CM | POA: Diagnosis not present

## 2024-10-16 DIAGNOSIS — Z79890 Hormone replacement therapy (postmenopausal): Secondary | ICD-10-CM | POA: Diagnosis not present

## 2024-10-16 DIAGNOSIS — Z7982 Long term (current) use of aspirin: Secondary | ICD-10-CM | POA: Diagnosis not present

## 2024-10-16 DIAGNOSIS — Z1152 Encounter for screening for COVID-19: Secondary | ICD-10-CM | POA: Diagnosis not present

## 2024-10-16 DIAGNOSIS — G4733 Obstructive sleep apnea (adult) (pediatric): Secondary | ICD-10-CM | POA: Diagnosis present

## 2024-10-16 DIAGNOSIS — Z881 Allergy status to other antibiotic agents status: Secondary | ICD-10-CM | POA: Diagnosis not present

## 2024-10-16 DIAGNOSIS — Z8616 Personal history of COVID-19: Secondary | ICD-10-CM | POA: Diagnosis not present

## 2024-10-16 DIAGNOSIS — F411 Generalized anxiety disorder: Secondary | ICD-10-CM | POA: Diagnosis present

## 2024-10-16 DIAGNOSIS — D649 Anemia, unspecified: Secondary | ICD-10-CM | POA: Diagnosis present

## 2024-10-16 DIAGNOSIS — J441 Chronic obstructive pulmonary disease with (acute) exacerbation: Secondary | ICD-10-CM | POA: Diagnosis present

## 2024-10-16 DIAGNOSIS — E782 Mixed hyperlipidemia: Secondary | ICD-10-CM | POA: Diagnosis present

## 2024-10-16 DIAGNOSIS — Z91048 Other nonmedicinal substance allergy status: Secondary | ICD-10-CM | POA: Diagnosis not present

## 2024-10-16 DIAGNOSIS — E039 Hypothyroidism, unspecified: Secondary | ICD-10-CM | POA: Diagnosis present

## 2024-10-16 DIAGNOSIS — Z95818 Presence of other cardiac implants and grafts: Secondary | ICD-10-CM | POA: Diagnosis not present

## 2024-10-16 DIAGNOSIS — Z87891 Personal history of nicotine dependence: Secondary | ICD-10-CM | POA: Diagnosis not present

## 2024-10-16 DIAGNOSIS — E1169 Type 2 diabetes mellitus with other specified complication: Secondary | ICD-10-CM | POA: Diagnosis not present

## 2024-10-16 DIAGNOSIS — J9621 Acute and chronic respiratory failure with hypoxia: Secondary | ICD-10-CM | POA: Diagnosis not present

## 2024-10-16 DIAGNOSIS — I110 Hypertensive heart disease with heart failure: Secondary | ICD-10-CM | POA: Diagnosis present

## 2024-10-16 DIAGNOSIS — I5032 Chronic diastolic (congestive) heart failure: Secondary | ICD-10-CM | POA: Diagnosis not present

## 2024-10-16 DIAGNOSIS — I700 Atherosclerosis of aorta: Secondary | ICD-10-CM | POA: Diagnosis present

## 2024-10-16 DIAGNOSIS — R0603 Acute respiratory distress: Secondary | ICD-10-CM | POA: Diagnosis present

## 2024-10-16 LAB — CBC
HCT: 41.5 % (ref 39.0–52.0)
Hemoglobin: 12.4 g/dL — ABNORMAL LOW (ref 13.0–17.0)
MCH: 23.6 pg — ABNORMAL LOW (ref 26.0–34.0)
MCHC: 29.9 g/dL — ABNORMAL LOW (ref 30.0–36.0)
MCV: 79 fL — ABNORMAL LOW (ref 80.0–100.0)
Platelets: 254 K/uL (ref 150–400)
RBC: 5.25 MIL/uL (ref 4.22–5.81)
RDW: 18.4 % — ABNORMAL HIGH (ref 11.5–15.5)
WBC: 12.7 K/uL — ABNORMAL HIGH (ref 4.0–10.5)
nRBC: 0 % (ref 0.0–0.2)

## 2024-10-16 LAB — BASIC METABOLIC PANEL WITH GFR
Anion gap: 15 (ref 5–15)
BUN: 26 mg/dL — ABNORMAL HIGH (ref 8–23)
CO2: 27 mmol/L (ref 22–32)
Calcium: 9.1 mg/dL (ref 8.9–10.3)
Chloride: 97 mmol/L — ABNORMAL LOW (ref 98–111)
Creatinine, Ser: 0.9 mg/dL (ref 0.61–1.24)
GFR, Estimated: >60 mL/min (ref >60)
Glucose, Bld: 141 mg/dL — ABNORMAL HIGH (ref 70–99)
Potassium: 4.1 mmol/L (ref 3.5–5.1)
Sodium: 139 mmol/L (ref 135–145)

## 2024-10-16 LAB — GLUCOSE, CAPILLARY
Glucose-Capillary: 169 mg/dL — ABNORMAL HIGH (ref 70–99)
Glucose-Capillary: 264 mg/dL — ABNORMAL HIGH (ref 70–99)
Glucose-Capillary: 201 mg/dL — ABNORMAL HIGH (ref 70–99)
Glucose-Capillary: 84 mg/dL (ref 70–99)

## 2024-10-16 MED ORDER — DOXYCYCLINE HYCLATE 100 MG PO TABS
100.0000 mg | ORAL_TABLET | Freq: Two times a day (BID) | ORAL | Status: DC
  Administered 2024-10-16 – 2024-10-19 (×7): 100 mg via ORAL
  Filled 2024-10-16 (×7): qty 1

## 2024-10-16 MED ORDER — IPRATROPIUM-ALBUTEROL 0.5-2.5 (3) MG/3ML IN SOLN
3.0000 mL | Freq: Four times a day (QID) | RESPIRATORY_TRACT | Status: DC
Start: 2024-10-16 — End: 2024-10-17
  Administered 2024-10-16 – 2024-10-17 (×2): 3 mL via RESPIRATORY_TRACT
  Filled 2024-10-16 (×3): qty 3

## 2024-10-16 MED ORDER — METHYLPREDNISOLONE SODIUM SUCC 125 MG IJ SOLR
80.0000 mg | INTRAMUSCULAR | Status: DC
  Administered 2024-10-16 – 2024-10-18 (×3): 80 mg via INTRAVENOUS
  Filled 2024-10-16 (×3): qty 2

## 2024-10-16 NOTE — Care Management Obs Status (Signed)
 MEDICARE OBSERVATION STATUS NOTIFICATION   Patient Details  Name: Paul [name] MRN: [id number] Date of Birth: 01-Jun-1946   Medicare Observation Status Notification Given:  Yes    Carletha Spruce, RN 10/16/2024, 8:40 AM

## 2024-10-16 NOTE — Progress Notes (Signed)
 [facility]  PROGRESS NOTE  Paul [name] FMW:[id number] DOB: 04/25/1946 DOA: 10/14/2024 PCP: [name], MD   Brief HPI:   78 y.o. male with medical history significant of COPD, OSA, chronic respiratory failure, chronic HFpEF, hypertension, type 2 diabetes, paroxysmal A-fib with Watchman device, hypothyroidism, anxiety. Recent hospital admission 10/6-10/9 for COPD exacerbation. Patient presents back to the hospital hospital with complaints of worsening shortness of breath. As per patient he has been having difficulty with breathing with O2 sats dropping to 70s on ambulation.    Assessment/Plan:   Acute on chronic hypoxemic respiratory failure Secondary to COPD exacerbation- -Will treat underlying cause aggressively with IV steroids -Continue to wean off oxygen  as tolerated - Patient is chronically on 2 L oxygen  at home via nasal cannula  Severe COPD exacerbation - Continue DuoNebs 4 times daily - Continue Brovana , budesonide  nebs twice daily - Will continue with Solu-Medrol  40 mg IV twice daily - Continue Rocephin  IV, will add doxycycline  - Continue Mucinex   Chronic diastolic heart failure - Appears euvolemic - EF is 65 to 70% with grade 1 diastolic dysfunction - Continue Lasix  40 mg daily  Diabetes mellitus type 2 - Continue sliding scale insulin  with NovoLog  - Continue CBG monitoring  Hypertension - Continue amlodipine   Hypothyroidism - Continue levothyroxine   Generalized anxiety disorder - Continue Zoloft , Xanax  as needed  OSA - Continue CPAP at bedtime    DVT prophylaxis: Lovenox   Medications     ALPRAZolam   0.5 mg Oral QHS   amLODipine   5 mg Oral Daily   arformoterol   15 mcg Nebulization BID   aspirin  EC  81 mg Oral Daily   atorvastatin   40 mg Oral QHS   budesonide  (PULMICORT ) nebulizer solution  0.5 mg Nebulization BID   dapagliflozin  propanediol  10 mg Oral Daily   [START ON 10/17/2024] digoxin   0.0625 mg Oral Daily   doxycycline   100 mg  Oral Q12H   enoxaparin  (LOVENOX ) injection  40 mg Subcutaneous Daily   ferrous sulfate   325 mg Oral Q breakfast   furosemide   40 mg Oral Daily   guaiFENesin   1,200 mg Oral Once   insulin  aspart  0-5 Units Subcutaneous QHS   insulin  aspart  0-9 Units Subcutaneous TID WC   ipratropium-albuterol   3 mL Nebulization Q6H   levothyroxine   25 mcg Oral QAC breakfast   loratadine   10 mg Oral q AM   melatonin  12 mg Oral QHS   methylPREDNISolone  (SOLU-MEDROL ) injection  80 mg Intravenous Q24H   pantoprazole   40 mg Oral BID AC   sertraline   50 mg Oral QHS   sodium chloride  flush  3 mL Intravenous Q12H     Data Reviewed:   CBG:  Recent Labs  Lab 10/15/24 2126 10/16/24 0737 10/16/24 1237  GLUCAP 216* 169* 264*    SpO2: 97 % O2 Flow Rate (L/min): 2 L/min FiO2 (%): 24 %    Vitals:   10/16/24 0610 10/16/24 0730 10/16/24 0803 10/16/24 1234  BP: (!) 191/66 (!) 159/95 (!) 159/95 (!) 144/58  Pulse: 100 86 83 90  Resp: 19 14 14 14   Temp: 98.1 F (36.7 C)  97.7 F (36.5 C) 97.8 F (36.6 C)  TempSrc: Oral  Oral Oral  SpO2: 99% 99% 96% 97%  Weight:      Height:          Data Reviewed:  Basic Metabolic Panel: Recent Labs  Lab 10/14/24 2124 10/16/24 0512  NA 139 139  K 4.3 4.1  CL  95* 97*  CO2 31 27  GLUCOSE 145* 141*  BUN 22 26*  CREATININE 1.06 0.90  CALCIUM  9.5 9.1    CBC: Recent Labs  Lab 10/14/24 2124 10/16/24 0512  WBC 11.4* 12.7*  NEUTROABS 9.7*  --   HGB 12.8* 12.4*  HCT 42.4 41.5  MCV 78.7* 79.0*  PLT 284 254    LFT No results for input(s): AST, ALT, ALKPHOS, BILITOT, PROT, ALBUMIN  in the last 168 hours.   Antibiotics: Anti-infectives (From admission, onward)    Start     Dose/Rate Route Frequency Ordered Stop   10/16/24 1045  doxycycline  (VIBRA -TABS) tablet 100 mg        100 mg Oral Every 12 hours 10/16/24 0948     10/15/24 1300  cefTRIAXone  (ROCEPHIN ) 2 g in sodium chloride  0.9 % 100 mL IVPB        2 g 200 mL/hr over 30 Minutes  Intravenous Daily 10/15/24 1150          CONSULTS   Code Status: Full code  Family Communication: No family at bedside     Subjective   Patient seen and examined, still complains of difficulty breathing.   Objective    Physical Examination:   General-appears in no acute distress Heart-S1-S2, regular, no murmur auscultated Lungs-decreased breath sounds bilaterally, scattered wheezing Abdomen-soft, nontender, no organomegaly Extremities-no edema in the lower extremities Neuro-alert, oriented x3, no focal deficit noted  Status is: Inpatient:             [name] S [name]   [facility] If 7PM-7AM, please contact night-coverage at [email or web address], Office  [phone]   10/16/2024, 5:00 PM  LOS: 0 days

## 2024-10-16 NOTE — Plan of Care (Signed)

## 2024-10-16 NOTE — Progress Notes (Signed)
 RT assessed PT/ PT stated  he wanted to try tonight without the CPAP. PT was made aware that if he feels like he needs it, we will come help him put the machine on.

## 2024-10-16 NOTE — Evaluation (Signed)
 Physical Therapy Brief Evaluation and Discharge Note Patient Details Name: Paul [name] MRN: [id number] DOB: Aug 25, 1946 Today's Date: 10/16/2024   History of Present Illness  Paul [name] is a 78 y.o. male presents back to the hospital hospital with complaints of worsening shortness of breath. Past medical history significant of COPD, OSA, chronic respiratory failure, chronic HFpEF, hypertension, type 2 diabetes, paroxysmal A-fib with Watchman device, hypothyroidism, anxiety. Recent hospital admission 10/6-10/9 for COPD exacerbation.  Clinical Impression  Pt presents with admitting diagnosis above. Pt today was able to ambulate in hallway with no AD at supervision level. Pt took 2 standing rest breaks however SpO2 remained above 93% on 2L for the duration of session. Pt may benefit from outpatient pulmonary rehab upon DC. Pt presents at or near baseline mobility. Pt has no further acute PT needs and will be signing off. Pt would benefit from continued mobility with mobility specialist during acute stay. Re consult PT if mobility status changes.         PT Assessment Patient does not need any further PT services  Assistance Needed at Discharge  PRN    Equipment Recommendations None recommended by PT  Recommendations for Other Services       Precautions/Restrictions Precautions Precautions: Fall Recall of Precautions/Restrictions: Intact Restrictions Weight Bearing Restrictions Per Provider Order: No        Mobility  Bed Mobility       General bed mobility comments: Up in recliner  Transfers Overall transfer level: Independent Equipment used: None                    Ambulation/Gait Ambulation/Gait assistance: Supervision Gait Distance (Feet): 300 Feet Assistive device: None Gait Pattern/deviations: WFL(Within Functional Limits) Gait Speed: Pace WFL General Gait Details: 2 standing rest breaks. Cues for PLB. no LOB noted.  Home Activity Instructions     Stairs            Modified Rankin (Stroke Patients Only)        Balance Overall balance assessment: No apparent balance deficits (not formally assessed)                        Pertinent Vitals/Pain PT - Brief Vital Signs All Vital Signs Stable: Yes Pain Assessment Pain Assessment: No/denies pain     Home Living Family/patient expects to be discharged to:: Private residence Living Arrangements: Spouse/significant other Available Help at Discharge: Family;Available 24 hours/day Home Environment: Level entry   Home Equipment: Rollator (4 wheels);Shower seat;Grab bars - tub/shower;Cane - single point   Additional Comments: On home O2 when sleeping or with exercise    Prior Function Level of Independence: Independent      UE/LE Assessment   UE ROM/Strength/Tone/Coordination: WFL    LE ROM/Strength/Tone/Coordination: [facility]      Communication   Communication Communication: No apparent difficulties     Cognition Overall Cognitive Status: Appears within functional limits for tasks assessed/performed       General Comments General comments (skin integrity, edema, etc.): SpO2 at or above 93% on 2L during ambulation.    Exercises     Assessment/Plan    PT Problem List         PT Visit Diagnosis Other abnormalities of gait and mobility (R26.89)    No Skilled PT Patient at baseline level of functioning   Co-evaluation                AMPAC 6 Clicks Help needed turning  from your back to your side while in a flat bed without using bedrails?: None Help needed moving from lying on your back to sitting on the side of a flat bed without using bedrails?: None Help needed moving to and from a bed to a chair (including a wheelchair)?: None Help needed standing up from a chair using your arms (e.g., wheelchair or bedside chair)?: None Help needed to walk in hospital room?: A Little Help needed climbing 3-5 steps with a railing? : A Little 6  Click Score: 22      End of Session Equipment Utilized During Treatment: Gait belt;Oxygen  Activity Tolerance: Patient tolerated treatment well Patient left: in chair;with call bell/phone within reach Nurse Communication: Mobility status PT Visit Diagnosis: Other abnormalities of gait and mobility (R26.89)     Time: 8552-8541 PT Time Calculation (min) (ACUTE ONLY): 11 min  Charges:   PT Evaluation $PT Eval Low Complexity: 1 Low      Karon Heckendorn B, PT, DPT Acute Rehab Services [phone]   Dondre Catalfamo  10/16/2024, 3:50 PM

## 2024-10-17 DIAGNOSIS — I5032 Chronic diastolic (congestive) heart failure: Secondary | ICD-10-CM | POA: Diagnosis not present

## 2024-10-17 DIAGNOSIS — J9621 Acute and chronic respiratory failure with hypoxia: Secondary | ICD-10-CM | POA: Diagnosis not present

## 2024-10-17 DIAGNOSIS — I480 Paroxysmal atrial fibrillation: Secondary | ICD-10-CM | POA: Diagnosis not present

## 2024-10-17 DIAGNOSIS — E1169 Type 2 diabetes mellitus with other specified complication: Secondary | ICD-10-CM | POA: Diagnosis not present

## 2024-10-17 LAB — GLUCOSE, CAPILLARY
Glucose-Capillary: 99 mg/dL (ref 70–99)
Glucose-Capillary: 91 mg/dL (ref 70–99)
Glucose-Capillary: 334 mg/dL — ABNORMAL HIGH (ref 70–99)
Glucose-Capillary: 175 mg/dL — ABNORMAL HIGH (ref 70–99)

## 2024-10-17 MED ORDER — IPRATROPIUM-ALBUTEROL 0.5-2.5 (3) MG/3ML IN SOLN: 3.0000 mL | Freq: Four times a day (QID) | RESPIRATORY_TRACT | Status: DC | PRN

## 2024-10-17 MED ORDER — BUDESON-GLYCOPYRROL-FORMOTEROL 160-9-4.8 MCG/ACT IN AERO
2.0000 | INHALATION_SPRAY | Freq: Two times a day (BID) | RESPIRATORY_TRACT | Status: DC
  Filled 2024-10-17: qty 5.9

## 2024-10-17 MED ORDER — IPRATROPIUM-ALBUTEROL 0.5-2.5 (3) MG/3ML IN SOLN
3.0000 mL | Freq: Four times a day (QID) | RESPIRATORY_TRACT | Status: DC
  Administered 2024-10-17 – 2024-10-19 (×8): 3 mL via RESPIRATORY_TRACT
  Filled 2024-10-17 (×8): qty 3

## 2024-10-17 MED ORDER — GUAIFENESIN 100 MG/5ML PO LIQD
15.0000 mL | Freq: Four times a day (QID) | ORAL | Status: DC
  Administered 2024-10-17 – 2024-10-19 (×7): 15 mL via ORAL
  Filled 2024-10-17 (×7): qty 20

## 2024-10-17 MED ORDER — SENNOSIDES-DOCUSATE SODIUM 8.6-50 MG PO TABS
2.0000 | ORAL_TABLET | Freq: Two times a day (BID) | ORAL | Status: AC
  Administered 2024-10-17 (×2): 2 via ORAL
  Filled 2024-10-17 (×2): qty 2

## 2024-10-17 NOTE — Progress Notes (Signed)
   10/17/24 2221  BiPAP/CPAP/SIPAP  $ Non-Invasive Home Ventilator  Subsequent  BiPAP/CPAP/SIPAP Pt Type Adult  BiPAP/CPAP/SIPAP Resmed  Mask Type Nasal mask  Mask Size Medium  Respiratory Rate 18 breaths/min  EPAP 9 cmH2O  Flow Rate 2 lpm  Patient Home Machine No  Patient Home Mask No  Patient Home Tubing No  Auto Titrate No  Device Plugged into RED Power Outlet Yes

## 2024-10-17 NOTE — Progress Notes (Deleted)
   10/17/24 1957  BiPAP/CPAP/SIPAP  Reason BIPAP/CPAP not in use Non-compliant (Pt states he does not want to wear CPAP tonight)

## 2024-10-17 NOTE — Plan of Care (Signed)
   Problem: Health Behavior/Discharge Planning: Goal: Ability to manage health-related needs will improve Outcome: Progressing

## 2024-10-17 NOTE — Progress Notes (Addendum)
 [facility]  PROGRESS NOTE  Paul [name] FMW:[id number] DOB: 10-26-1946 DOA: 10/14/2024 PCP: Paul Clapper, MD   Brief HPI:   78 y.o. male with medical history significant of COPD, OSA, chronic respiratory failure, chronic HFpEF, hypertension, type 2 diabetes, paroxysmal A-fib with Watchman device, hypothyroidism, anxiety. Recent hospital admission 10/6-10/9 for COPD exacerbation. Patient presents back to the hospital hospital with complaints of worsening shortness of breath. As per patient he has been having difficulty with breathing with O2 sats dropping to 70s on ambulation.    Assessment/Plan:   Acute on chronic hypoxemic respiratory failure Secondary to COPD exacerbation- -Will treat underlying cause aggressively with IV steroids -Continue to wean off oxygen  as tolerated - Patient is chronically on 2 L oxygen  at home via nasal cannula  Severe COPD exacerbation - Continue DuoNebs 4 times daily - Continue Brovana , budesonide  nebs twice daily - Will continue with Solu-Medrol  40 mg IV twice daily - Continue Rocephin  IV, p.o. doxycycline  - Continue Mucinex  400 mg every 6 hours - Continue incentive spirometry, flutter valve  Chronic diastolic heart failure - Appears euvolemic - EF is 65 to 70% with grade 1 diastolic dysfunction - Continue Lasix  40 mg daily  Diabetes mellitus type 2 - Continue sliding scale insulin  with NovoLog  - Continue CBG monitoring - CBG well-controlled  Hypertension - Continue amlodipine   Hypothyroidism - Continue levothyroxine   Generalized anxiety disorder - Continue Zoloft , Xanax  as needed  OSA - Continue CPAP at bedtime    DVT prophylaxis: Lovenox   Medications     ALPRAZolam   0.5 mg Oral QHS   amLODipine   5 mg Oral Daily   aspirin  EC  81 mg Oral Daily   atorvastatin   40 mg Oral QHS   dapagliflozin  propanediol  10 mg Oral Daily   digoxin   0.0625 mg Oral Daily   doxycycline   100 mg Oral Q12H   enoxaparin  (LOVENOX ) injection  40  mg Subcutaneous Daily   ferrous sulfate   325 mg Oral Q breakfast   furosemide   40 mg Oral Daily   guaiFENesin   15 mL Oral Q6H   insulin  aspart  0-5 Units Subcutaneous QHS   insulin  aspart  0-9 Units Subcutaneous TID WC   ipratropium-albuterol   3 mL Nebulization Q6H   levothyroxine   25 mcg Oral QAC breakfast   loratadine   10 mg Oral q AM   melatonin  12 mg Oral QHS   methylPREDNISolone  (SOLU-MEDROL ) injection  80 mg Intravenous Q24H   pantoprazole   40 mg Oral BID AC   senna-docusate  2 tablet Oral BID   sertraline   50 mg Oral QHS   sodium chloride  flush  3 mL Intravenous Q12H     Data Reviewed:   CBG:  Recent Labs  Lab 10/16/24 1237 10/16/24 1753 10/16/24 2108 10/17/24 0750 10/17/24 1234  GLUCAP 264* 201* 84 99 91    SpO2: 96 % O2 Flow Rate (L/min): 2 L/min FiO2 (%): 24 %    Vitals:   10/17/24 0805 10/17/24 1022 10/17/24 1023 10/17/24 1233  BP:  (!) 171/63  (!) 146/59  Pulse: 82  76   Resp: 18   16  Temp:    97.6 F (36.4 C)  TempSrc:    Oral  SpO2: 96%     Weight:      Height:          Data Reviewed:  Basic Metabolic Panel: Recent Labs  Lab 10/14/24 2124 10/16/24 0512  NA 139 139  K 4.3 4.1  CL 95* 97*  CO2 31 27  GLUCOSE 145* 141*  BUN 22 26*  CREATININE 1.06 0.90  CALCIUM  9.5 9.1    CBC: Recent Labs  Lab 10/14/24 2124 10/16/24 0512  WBC 11.4* 12.7*  NEUTROABS 9.7*  --   HGB 12.8* 12.4*  HCT 42.4 41.5  MCV 78.7* 79.0*  PLT 284 254    LFT No results for input(s): AST, ALT, ALKPHOS, BILITOT, PROT, ALBUMIN  in the last 168 hours.   Antibiotics: Anti-infectives (From admission, onward)    Start     Dose/Rate Route Frequency Ordered Stop   10/16/24 1045  doxycycline  (VIBRA -TABS) tablet 100 mg        100 mg Oral Every 12 hours 10/16/24 0948     10/15/24 1300  cefTRIAXone  (ROCEPHIN ) 2 g in sodium chloride  0.9 % 100 mL IVPB        2 g 200 mL/hr over 30 Minutes Intravenous Daily 10/15/24 1150          CONSULTS    Code Status: Full code  Family Communication: No family at bedside     Subjective    Breathing has improved.  Objective    Physical Examination:  General-appears in no acute distress Heart-S1-S2, regular, no murmur auscultated Lungs-scattered wheezing bilaterally, good airflow bilaterally Abdomen-soft, nontender, no organomegaly Extremities-no edema in the lower extremities Neuro-alert, oriented x3, no focal deficit noted   Status is: Inpatient:             Paul [name] S Paul [name]   [facility] If 7PM-7AM, please contact night-coverage at [email or web address], Office  [phone]   10/17/2024, 1:07 PM  LOS: 1 day

## 2024-10-18 DIAGNOSIS — I5032 Chronic diastolic (congestive) heart failure: Secondary | ICD-10-CM | POA: Diagnosis not present

## 2024-10-18 DIAGNOSIS — E1169 Type 2 diabetes mellitus with other specified complication: Secondary | ICD-10-CM | POA: Diagnosis not present

## 2024-10-18 DIAGNOSIS — J9621 Acute and chronic respiratory failure with hypoxia: Secondary | ICD-10-CM | POA: Diagnosis not present

## 2024-10-18 DIAGNOSIS — I480 Paroxysmal atrial fibrillation: Secondary | ICD-10-CM | POA: Diagnosis not present

## 2024-10-18 LAB — GLUCOSE, CAPILLARY
Glucose-Capillary: 136 mg/dL — ABNORMAL HIGH (ref 70–99)
Glucose-Capillary: 93 mg/dL (ref 70–99)
Glucose-Capillary: 244 mg/dL — ABNORMAL HIGH (ref 70–99)
Glucose-Capillary: 182 mg/dL — ABNORMAL HIGH (ref 70–99)

## 2024-10-18 NOTE — TOC Initial Note (Addendum)
 Transition of Care [facility]) - Initial/Assessment Note    Patient Details  Name: Paul [name] MRN: [id number] Date of Birth: 09-13-1946  [facility] CM/SW Contact:    Sudie Erminio Deems, RN Phone Number: 10/18/2024, 12:59 PM  Clinical Narrative:  Risk for Readmission Assessment completed. Patient presented for COPD exacerbation. PTA patient was from home with spouse. Patient has oxygen  at 2 Liters via [employer], portable oxygen  concentrators, and a 5 liter concentrator in the home. Additional DME are: CPAP, nebulizer machine, cane, rolling walker, and shower chair. Inpatient Case Manager spoke with the patient regarding home health needs and the patients spouse states he really benefited from [facility] in the past. Inpatient Case Manager discussed home health needs and they declined [facility] and felt they would better benefit from palliative services. Referral submitted to [facility]. Office will call the patient once he is discharged from the hospital. No further needs identified at this time.                               1352 10-18-24 ICM verified liter flow with [employer] and the patient wears 2 Liters continuous.  Expected Discharge Plan: Home/Self Care Barriers to Discharge: No Barriers Identified   Patient Goals and CMS Choice Patient states their goals for this hospitalization and ongoing recovery are:: plans ot return home    Expected Discharge Plan and Services In-house Referral: NA Discharge Planning Services: CM Consult Post Acute Care Choice: NA Living arrangements for the past 2 months: Single Family Home                   DME Agency: NA       HH Arranged: NA  Prior Living Arrangements/Services Living arrangements for the past 2 months: Single Family Home Lives with:: Spouse Patient language and need for interpreter reviewed:: Yes Do you feel safe going back to the place where you live?: Yes      Need for Family  Participation in Patient Care: Yes (Comment) Care giver support system in place?: Yes (comment) Current home services: DME (CPAP, Nebulizer Machine, Cane, Rolling walker, and shower chair.) Criminal Activity/Legal Involvement Pertinent to Current Situation/Hospitalization: No - Comment as needed  Activities of Daily Living   ADL Screening (condition at time of admission) Independently performs ADLs?: Yes (appropriate for developmental age) Is the patient deaf or have difficulty hearing?: No Does the patient have difficulty seeing, even when wearing glasses/contacts?: No Does the patient have difficulty concentrating, remembering, or making decisions?: No  Permission Sought/Granted Permission sought to share information with : Family Supports, Case Production Designer, Theatre/television/film, Oceanographer granted to share information with : Yes, Verbal Permission Granted     Permission granted to share info w AGENCY: [facility].        Emotional Assessment Appearance:: Appears stated age Attitude/Demeanor/Rapport: Engaged Affect (typically observed): Appropriate Orientation: : Oriented to Self, Oriented to Place, Oriented to  Time Alcohol / Substance Use: Not Applicable Psych Involvement: No (comment)  Admission diagnosis:  Respiratory distress [R06.03] COPD exacerbation (HCC) [J44.1] Patient Active Problem List   Diagnosis Date Noted   Acute on chronic respiratory failure with hypoxia (HCC) 10/15/2024   Severe persistent asthma with acute exacerbation (HCC) 10/14/2024   COPD exacerbation (HCC) 09/27/2024   Hypothyroidism 09/27/2024   Immunization due 09/16/2024   COPD (chronic obstructive pulmonary disease) (HCC) 09/02/2024   COPD  with acute exacerbation (HCC) 08/30/2024   Presence of heart assist device (HCC) 07/08/2024   Abnormal TSH 04/05/2024   Encounter for Medicare annual wellness exam 04/04/2024   Syncope and collapse 08/29/2023    Laceration of left thigh 08/29/2023   Age-related osteoporosis without current pathological fracture 07/26/2023   Secondary hypercoagulable state 04/02/2023   OSA (obstructive sleep apnea) 03/26/2023   Hypoxia 02/04/2023   Elevated d-dimer 01/25/2023   Iron 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 08/16/2022   Olecranon bursitis of right elbow 05/16/2022   Syncope 04/08/2022   GERD (gastroesophageal reflux disease) / GI protection on antiplatelet therapy 12/27/2021   Lumbar back pain 11/17/2021   Oxygen  dependent 10/09/2021   Paroxysmal atrial fibrillation (HCC) 08/02/2021   Chronic respiratory failure with hypoxia (HCC) 01/16/2021   GAD (generalized anxiety disorder) 01/14/2021   Chronic diastolic CHF (congestive heart failure) (HCC) 04/01/2020   Age-related osteoporosis with healing pathological fracture, chronic back pain 02/06/2020   Essential hypertension 01/28/2020   Chronic bilateral thoracic back pain 01/25/2020   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HCC) 01/25/2020   Mixed hyperlipidemia 01/05/2020   Absolute anemia 05/29/2019   Pulmonary nodules 05/29/2019   Coronary artery calcification 04/06/2019   Atherosclerosis of aorta 04/06/2019   Acute on chronic respiratory failure with hypoxia and hypercapnia (HCC)    Former cigarette smoker 12/05/2015   COPD GOLD  III     PCP:  [name], MD Pharmacy:   [phone] [place], [place] - [address] [place] [address] Phone: [phone] Fax: [phone]  [facility] [address] [place] [address] Phone: [phone] Fax: [phone]     Social Drivers of Health (SDOH) Social History: SDOH Screenings   Food Insecurity: No Food Insecurity (10/15/2024)  Housing: Low Risk  (10/15/2024)  Transportation Needs: No Transportation Needs (10/15/2024)  Utilities: Not At Risk (10/15/2024)  Alcohol Screen: Low Risk   (07/26/2024)  Depression (PHQ2-9): Low Risk  (07/26/2024)  Financial Resource Strain: Low Risk  (07/19/2023)  Physical Activity: Insufficiently Active (07/19/2023)  Social Connections: Socially Integrated (10/15/2024)  Stress: No Stress Concern Present (07/19/2023)  Tobacco Use: Medium Risk (10/14/2024)  Health Literacy: Adequate Health Literacy (07/23/2023)   SDOH Interventions:     Readmission Risk Interventions    10/18/2024   12:59 PM 09/07/2024    3:38 PM 02/18/2023    3:55 PM  Readmission Risk Prevention Plan  Transportation Screening Complete Complete Complete  PCP or Specialist Appt within 5-7 Days  Complete   Home Care Screening  Complete   Medication Review (RN CM)  Complete   HRI or Home Care Consult Complete    Social Work Consult for Recovery Care Planning/Counseling Complete    Palliative Care Screening Complete    Medication Review Oceanographer) Referral to Pharmacy  Referral to Pharmacy  HRI or Home Care Consult   Complete  SW Recovery Care/Counseling Consult   Complete  Palliative Care Screening   Complete  Skilled Nursing Facility   Not Applicable

## 2024-10-18 NOTE — Progress Notes (Signed)
 [facility]  PROGRESS NOTE  Paul [name] FMW:[id number] DOB: [birth year] DOA: 10/14/2024 PCP: [name], MD   Brief HPI:   78 y.o. male with medical history significant of COPD, OSA, chronic respiratory failure, chronic HFpEF, hypertension, type 2 diabetes, paroxysmal A-fib with Watchman device, hypothyroidism, anxiety. Recent hospital admission 10/6-10/9 for COPD exacerbation. Patient presents back to the hospital hospital with complaints of worsening shortness of breath. As per patient he has been having difficulty with breathing with O2 sats dropping to 70s on ambulation.    Assessment/Plan:   Acute on chronic hypoxemic respiratory failure Secondary to COPD exacerbation -Started on IV steroids with significant improvement -Patient is chronically on 2 L of oxygen  at bedtime at home -Continue to wean off oxygen  as tolerated -He might need oxygen  at home, will check continuous pulse ox on ambulation  Severe COPD exacerbation - Continue DuoNebs 4 times daily - Continue Brovana , budesonide  nebs twice daily - Will continue with Solu-Medrol  40 mg IV twice daily - Continue Rocephin  IV, p.o. doxycycline  - Continue Mucinex  400 mg every 6 hours - Continue incentive spirometry, flutter valve - Check continuous pulse ox on ambulation  Chronic diastolic heart failure - Appears euvolemic - EF is 65 to 70% with grade 1 diastolic dysfunction - Continue Lasix  40 mg daily  Diabetes mellitus type 2 - Continue sliding scale insulin  with NovoLog  - Continue CBG monitoring - CBG well-controlled  Hypertension - Continue amlodipine   Hypothyroidism - Continue levothyroxine   Generalized anxiety disorder - Continue Zoloft , Xanax  as needed  OSA - Continue CPAP at bedtime    DVT prophylaxis: Lovenox   Medications     ALPRAZolam   0.5 mg Oral QHS   amLODipine   5 mg Oral Daily   aspirin  EC  81 mg Oral Daily   atorvastatin   40 mg Oral QHS   dapagliflozin  propanediol  10 mg Oral  Daily   digoxin   0.0625 mg Oral Daily   doxycycline   100 mg Oral Q12H   enoxaparin  (LOVENOX ) injection  40 mg Subcutaneous Daily   ferrous sulfate   325 mg Oral Q breakfast   furosemide   40 mg Oral Daily   guaiFENesin   15 mL Oral Q6H   insulin  aspart  0-5 Units Subcutaneous QHS   insulin  aspart  0-9 Units Subcutaneous TID WC   ipratropium-albuterol   3 mL Nebulization Q6H   levothyroxine   25 mcg Oral QAC breakfast   loratadine   10 mg Oral q AM   melatonin  12 mg Oral QHS   methylPREDNISolone  (SOLU-MEDROL ) injection  80 mg Intravenous Q24H   pantoprazole   40 mg Oral BID AC   sertraline   50 mg Oral QHS   sodium chloride  flush  3 mL Intravenous Q12H     Data Reviewed:   CBG:  Recent Labs  Lab 10/17/24 0750 10/17/24 1234 10/17/24 1606 10/17/24 2124 10/18/24 0813  GLUCAP 99 91 334* 175* 136*    SpO2: 100 % O2 Flow Rate (L/min): 2 L/min FiO2 (%): 24 %    Vitals:   10/18/24 0030 10/18/24 0138 10/18/24 0708 10/18/24 0811  BP: (!) 142/62   (!) 150/57  Pulse: 85 77  87  Resp: 16 16  17   Temp: 98.4 F (36.9 C)   97.9 F (36.6 C)  TempSrc: Oral   Oral  SpO2: 92% 94% 97% 100%  Weight:      Height:          Data Reviewed:  Basic Metabolic Panel: Recent Labs  Lab 10/14/24 2124 10/16/24 0512  NA 139 139  K 4.3 4.1  CL 95* 97*  CO2 31 27  GLUCOSE 145* 141*  BUN 22 26*  CREATININE 1.06 0.90  CALCIUM  9.5 9.1    CBC: Recent Labs  Lab 10/14/24 2124 10/16/24 0512  WBC 11.4* 12.7*  NEUTROABS 9.7*  --   HGB 12.8* 12.4*  HCT 42.4 41.5  MCV 78.7* 79.0*  PLT 284 254    LFT No results for input(s): AST, ALT, ALKPHOS, BILITOT, PROT, ALBUMIN  in the last 168 hours.   Antibiotics: Anti-infectives (From admission, onward)    Start     Dose/Rate Route Frequency Ordered Stop   10/16/24 1045  doxycycline  (VIBRA -TABS) tablet 100 mg        100 mg Oral Every 12 hours 10/16/24 0948     10/15/24 1300  cefTRIAXone  (ROCEPHIN ) 2 g in sodium chloride  0.9 %  100 mL IVPB        2 g 200 mL/hr over 30 Minutes Intravenous Daily 10/15/24 1150          CONSULTS   Code Status: Full code  Family Communication: No family at bedside     Subjective   Breathing has improved, still desats on ambulation.   Objective    Physical Examination:  Appears in no acute distress Decreased breath sounds bilaterally S1-S2, regular Extremities no edema   Status is: Inpatient:             Paul [name]   [facility] If 7PM-7AM, please contact night-coverage at [email or web address], Office  [phone]   10/18/2024, 11:27 AM  LOS: 2 days

## 2024-10-18 NOTE — Progress Notes (Signed)
 Mobility Specialist Progress Note;   10/18/24 1457  Mobility  Activity Ambulated with assistance  Level of Assistance Standby assist, set-up cues, supervision of patient - no hands on  Assistive Device None  Distance Ambulated (ft) 350 ft  Activity Response Tolerated well  Mobility Referral Yes  Mobility visit 1 Mobility  Mobility Specialist Start Time (ACUTE ONLY) 1457  Mobility Specialist Stop Time (ACUTE ONLY) 1506  Mobility Specialist Time Calculation (min) (ACUTE ONLY) 9 min   Pt eager for mobility. On 2LO2 upon arrival. Required no physical assistance during ambulation, SV for safety. Attempted to ambulate on 1LO2, however pt required 2LO2 to maintain 92%. Briefly desat to 89%, however w/ standing rest break and PLB, pt able to recover to 93%. Pt returned back to chair and left with all needs met.   Lauraine Erm Mobility Specialist Please contact via SecureChat or [employer] [phone]

## 2024-10-18 NOTE — Progress Notes (Signed)
Pt. Refused cpap for tonight. 

## 2024-10-18 NOTE — Progress Notes (Signed)
 SATURATION QUALIFICATIONS: (This note is used to comply with regulatory documentation for home oxygen )  Patient Saturations on Room Air at Rest = 84%  Patient Saturations on Room Air while Ambulating = n/a%  Patient Saturations on 2 Liters of oxygen  while Ambulating = 91%  Please briefly explain why patient needs home oxygen : Pt needs to be on 2L of O2 via  at rest and with ambulation.

## 2024-10-18 NOTE — Progress Notes (Signed)
   10/18/24 2001  BiPAP/CPAP/SIPAP  BiPAP/CPAP/SIPAP Pt Type Adult  Reason BIPAP/CPAP not in use Non-compliant

## 2024-10-19 ENCOUNTER — Other Ambulatory Visit (HOSPITAL_COMMUNITY): Payer: Self-pay

## 2024-10-19 ENCOUNTER — Telehealth: Payer: Self-pay

## 2024-10-19 DIAGNOSIS — R0603 Acute respiratory distress: Secondary | ICD-10-CM | POA: Diagnosis not present

## 2024-10-19 DIAGNOSIS — J441 Chronic obstructive pulmonary disease with (acute) exacerbation: Secondary | ICD-10-CM | POA: Diagnosis not present

## 2024-10-19 DIAGNOSIS — I480 Paroxysmal atrial fibrillation: Secondary | ICD-10-CM | POA: Diagnosis not present

## 2024-10-19 DIAGNOSIS — I5032 Chronic diastolic (congestive) heart failure: Secondary | ICD-10-CM | POA: Diagnosis not present

## 2024-10-19 LAB — GLUCOSE, CAPILLARY: Glucose-Capillary: 164 mg/dL — ABNORMAL HIGH (ref 70–99)

## 2024-10-19 MED ORDER — PREDNISONE 10 MG PO TABS
ORAL_TABLET | ORAL | 1 refills | Status: DC
  Filled 2024-10-19: qty 180, 148d supply, fill #0

## 2024-10-19 NOTE — Telephone Encounter (Signed)
 Left voicemail at number below provided verbal ok for orders requested.  Copied from CRM #[id number]. Topic: Clinical - Home Health Verbal Orders >> Oct 19, 2024  1:20 PM Rachelle R wrote: Caller/Agency: Tammy / [facility] Number: [phone] Service Requested: palliative care Frequency: NP needs to consult, then will keep us  updated Any new concerns about the patient? No

## 2024-10-19 NOTE — Plan of Care (Signed)

## 2024-10-19 NOTE — Care Management Important Message (Signed)
 Important Message  Patient Details  Name: Paul [name] MRN: [id number] Date of Birth: Apr 26, 1946   Important Message Given:  Yes - Medicare IM     Vonzell Arrie Sharps 10/19/2024, 10:49 AM

## 2024-10-19 NOTE — Plan of Care (Signed)
   Problem: Education: Goal: Knowledge of General Education information will improve Description Including pain rating scale, medication(s)/side effects and non-pharmacologic comfort measures Outcome: Progressing   Problem: Health Behavior/Discharge Planning: Goal: Ability to manage health-related needs will improve Outcome: Progressing

## 2024-10-19 NOTE — Progress Notes (Signed)
 DISCHARGE NOTE HOME JUNIE ENGRAM to be discharged Home per MD order. Discussed prescriptions and follow up appointments with the patient. Prescriptions given to patient; medication list explained in detail. Patient verbalized understanding.  Skin clean, dry and intact without evidence of skin break down, no evidence of skin tears noted. IV catheter discontinued intact. Site without signs and symptoms of complications. Dressing and pressure applied. Pt denies pain at the site currently. No complaints noted.  Patient free of lines, drains, and wounds.   An After Visit Summary (AVS) was printed and given to the patient. Patient escorted via wheelchair, and discharged home via private auto.  Peyton SHAUNNA Pepper, RN

## 2024-10-19 NOTE — Discharge Summary (Addendum)
 Physician Discharge Summary   Patient: Paul [name] MRN: [id number] DOB: 15-May-1946  Admit date:     10/14/2024  Discharge date: 10/19/24  Discharge Physician: Sabas GORMAN Brod   PCP: [name], MD   Recommendations at discharge:   Follow-up PCP as outpatient  Discharge Diagnoses: Active Problems:   COPD exacerbation (HCC)   Acute on chronic respiratory failure with hypoxia (HCC)   Chronic diastolic CHF (congestive heart failure) (HCC)   Paroxysmal atrial fibrillation (HCC)   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HCC)   Essential hypertension   Hypothyroidism   OSA (obstructive sleep apnea)  Resolved Problems:   * No resolved hospital problems. *  Hospital Course: 78 y.o. male with medical history significant of COPD, OSA, chronic respiratory failure, chronic HFpEF, hypertension, type 2 diabetes, paroxysmal A-fib with Watchman device, hypothyroidism, anxiety. Recent hospital admission 10/6-10/9 for COPD exacerbation. Patient presents back to the hospital hospital with complaints of worsening shortness of breath. As per patient he has been having difficulty with breathing with O2 sats dropping to 70s on ambulation.  Assessment and Plan:  Acute on chronic hypoxemic respiratory failure Secondary to COPD exacerbation -Started on IV steroids with significant improvement -Patient is chronically on 2 L of oxygen  at bedtime at home -At this time he will need continuous 2 L/min of oxygen  at home. -Pulse ox on ambulation showed O2 sats dropped to 80s requiring 2 L of oxygen     Severe COPD exacerbation -Significantly improved with treatment in the hospital including DuoNebs, Brovana , budesonide  -He was treated with IV Solu-Medrol  40 mg twice daily -Also was given doxycycline  and Rocephin , Mucinex  -Provided incentive spirometry, flutter valve -At this time patient is stable for discharge, will be discharged on oxygen  2 L/min - Will discharge on prednisone  taper, as  below    Chronic diastolic heart failure - Appears euvolemic - EF is 65 to 70% with grade 1 diastolic dysfunction - Continue Lasix  40 mg daily   Diabetes mellitus type 2 - Continue home regimen   Hypertension - Continue amlodipine    Hypothyroidism - Continue levothyroxine    Generalized anxiety disorder - Continue Zoloft , Xanax  as needed   OSA - Continue CPAP at bedtime      Consultants:  Procedures performed:  Disposition: Home Diet recommendation:  Discharge Diet Orders (From admission, onward)     Start     Ordered   10/19/24 0000  Diet - low sodium heart healthy        10/19/24 1026           Regular diet DISCHARGE MEDICATION: Allergies as of 10/19/2024       Reactions   Tape Other (See Comments)   PLEASE USE COBAN WRAP IN LIEU OF TAPE, as it pulls off the skin!!   Daliresp  [roflumilast ] Diarrhea, Nausea And Vomiting   Levaquin  [levofloxacin ] Palpitations, Other (See Comments)   Made the B/P fluctuate and heart raced   Alendronate Anxiety, Other (See Comments)   Jittery/nervous        Medication List     STOP taking these medications    Airsupra 90-80 MCG/ACT Aero Generic drug: Albuterol -Budesonide  Replaced by: albuterol  108 (90 Base) MCG/ACT inhaler   amoxicillin -clavulanate 875-125 MG tablet Commonly known as: AUGMENTIN        TAKE these medications    Accu-Chek Softclix Lancets lancets Check fasting sugar once daily.   albuterol  (2.5 MG/3ML) 0.083% nebulizer solution Commonly known as: PROVENTIL  Take 3 mLs (2.5 mg total) by nebulization every 6 (six)  hours as needed for shortness of breath or wheezing. What changed: when to take this   albuterol  108 (90 Base) MCG/ACT inhaler Commonly known as: Ventolin  HFA Inhale 1-2 puffs into the lungs every 6 (six) hours as needed for wheezing or shortness of breath. What changed: You were already taking a medication with the same name, and this prescription was added. Make sure you  understand how and when to take each. Replaces: Airsupra 90-80 MCG/ACT Aero   ALPRAZolam  0.5 MG tablet Commonly known as: XANAX  Take 1 tablet (0.5 mg total) by mouth 2 (two) times daily as needed for anxiety. F41.1 What changed: when to take this   amLODipine  5 MG tablet Commonly known as: NORVASC  Take 1 tablet (5 mg total) by mouth daily. What changed: when to take this   aspirin  EC 81 MG tablet Take 1 tablet (81 mg total) by mouth daily. Swallow whole.   atorvastatin  40 MG tablet Commonly known as: LIPITOR TAKE 1 TABLET BY MOUTH EVERYDAY AT BEDTIME What changed: See the new instructions.   BLOOD GLUCOSE TEST STRIPS Strp Use to test blood sugar up to twice daily. May substitute to any manufacturer covered by patient's insurance. E11.0   Breztri  Aerosphere 160-9-4.8 MCG/ACT Aero inhaler Generic drug: budesonide -glycopyrrolate -formoterol  Inhale 2 puffs into the lungs 2 (two) times daily.   CALCIUM  PO Take 1 tablet by mouth daily.   dapagliflozin  propanediol 10 MG Tabs tablet Commonly known as: Farxiga  Take 1 tablet (10 mg total) by mouth daily.   denosumab  60 MG/ML Sosy injection Commonly known as: PROLIA  Inject 60 mg into the skin every 6 (six) months.   digoxin  0.125 MG tablet Commonly known as: LANOXIN  TAKE 1 TABLET BY MOUTH DAILY   ferrous sulfate  325 (65 FE) MG tablet Take 325 mg by mouth 2 (two) times daily with a meal. What changed: when to take this   Fish Oil 1200 MG Caps Take 1,200 mg by mouth in the morning and at bedtime.   furosemide  40 MG tablet Commonly known as: LASIX  Take 1 tablet (40 mg total) by mouth daily.   ipratropium-albuterol  0.5-2.5 (3) MG/3ML Soln Commonly known as: DUONEB Take 3 mLs by nebulization 2 (two) times daily for 10 days. What changed: when to take this   Lancets Misc. Misc Use to test blood sugar up to twice daily. May substitute to any manufacturer covered by patient's insurance. E11.0   levothyroxine  25 MCG  tablet Commonly known as: SYNTHROID  Take 1 tablet (25 mcg total) by mouth daily. What changed: when to take this   Melatonin 12 MG Tabs Take 12 mg by mouth at bedtime.   Mucinex  Maximum Strength 1200 MG Tb12 Generic drug: Guaifenesin  Take 1,200 mg by mouth daily.   multivitamin with minerals Tabs tablet Take 1 tablet by mouth daily with breakfast.   Nucala  100 MG/ML Sosy Generic drug: mepolizumab  Inject 100 mg into the skin every 30 (thirty) days.   OXYGEN  Inhale 2-3 L/min into the lungs continuous.   pantoprazole  40 MG tablet Commonly known as: PROTONIX  Take 1 tablet (40 mg total) by mouth 2 (two) times daily. What changed: when to take this   predniSONE  10 MG tablet Commonly known as: DELTASONE  Prednisone  40 mg po daily x 3 day then Prednisone  35 mg p.o. daily x 3 days Prednisone  30 mg po daily x 3 day then Prednisone  25 mg p.o. daily x 3 days Prednisone  20 mg po daily x 3 day then Prednisone  15 mg p.o. daily x 3 days  Then continue taking prednisone  10 mg p.o. daily What changed: additional instructions   Probiotic Daily Caps Take 1 capsule by mouth in the morning.   sertraline  50 MG tablet Commonly known as: ZOLOFT  Take 1 tablet (50 mg total) by mouth at bedtime.   vitamin C  1000 MG tablet Take 1,000 mg by mouth daily.   Vitamin D3 50 MCG (2000 UT) Tabs Take 2,000 Units by mouth in the morning.               Durable Medical Equipment  (From admission, onward)           Start     Ordered   10/18/24 1144  For home use only DME oxygen   Once       Question Answer Comment  Length of Need Lifetime   Mode or (Route) Nasal cannula   Liters per Minute 2   Frequency Continuous (stationary and portable oxygen  unit needed)   Oxygen  conserving device Yes   Oxygen  delivery system: Gas      10/18/24 1143            Follow-up Information     [facility] Follow up.   Specialty: PALLIATIVE CARE Why: [facility]. Contact information: [address] [place]  [address] [phone]               Discharge Exam: Filed Weights   10/14/24 2111 10/15/24 1100  Weight: 71.2 kg 70.3 kg   General-appears in no acute distress Heart-S1-S2, regular, no murmur auscultated Lungs-clear to auscultation bilaterally, no wheezing or crackles auscultated Abdomen-soft, nontender, no organomegaly Extremities-no edema in the lower extremities Neuro-alert, oriented x3, no focal deficit noted  Condition at discharge: good  The results of significant diagnostics from this hospitalization (including imaging, microbiology, ancillary and laboratory) are listed below for reference.   Imaging Studies: CT Angio Chest/Abd/Pel for Dissection W and/or Wo Contrast Result Date: 10/14/2024 CLINICAL DATA:  Chest and right flank pain EXAM: CT ANGIOGRAPHY CHEST, ABDOMEN AND PELVIS TECHNIQUE: Non-contrast CT of the chest was initially obtained. Multidetector CT imaging through the chest, abdomen and pelvis was performed using the standard protocol during bolus administration of intravenous contrast. Multiplanar reconstructed images and MIPs were obtained and reviewed to evaluate the vascular anatomy. RADIATION DOSE REDUCTION: This exam was performed according to the departmental dose-optimization program which includes automated exposure control, adjustment of the mA and/or kV according to patient size and/or use of iterative reconstruction technique. CONTRAST:  OMNIPAQUE  IOHEXOL  350 MG/ML SOLN COMPARISON:  05/29/2023 FINDINGS: CTA CHEST FINDINGS Cardiovascular: Initial precontrast images demonstrate atherosclerotic calcification. No aneurysmal dilatation is seen. No cardiac enlargement is noted. Coronary calcifications are seen. Left atrial appendage occlusion device is noted in place. Postcontrast images show no evidence of aortic dissection. No pulmonary embolism is seen although not timed  for embolus evaluation. Mediastinum/Nodes: Thoracic inlet is within normal limits. No hilar or mediastinal adenopathy is noted. The esophagus as visualized is within normal limits. Lungs/Pleura: Diffuse emphysematous changes are noted. No focal infiltrate or sizable effusion is noted. No parenchymal nodule is seen. Musculoskeletal: Chronic compression deformities of the thoracic spine are noted involving T4, T5, T7, T8 and T9. Review of the MIP images confirms the above findings. CTA ABDOMEN AND PELVIS FINDINGS VASCULAR Aorta: Abdominal aorta demonstrates atherosclerotic calcifications. No aneurysmal dilatation is seen. Celiac: Patent without evidence of aneurysm, dissection, vasculitis or significant stenosis. SMA: Patent without evidence of aneurysm, dissection, vasculitis or significant stenosis. Renals: Both  renal arteries are patent without evidence of aneurysm, dissection, vasculitis, fibromuscular dysplasia or significant stenosis. IMA: Patent without evidence of aneurysm, dissection, vasculitis or significant stenosis. Inflow: Iliacs demonstrate atherosclerotic calcification without focal stenosis. Veins: No specific venous abnormality is noted. Review of the MIP images confirms the above findings. NON-VASCULAR Hepatobiliary: Fatty infiltration of the liver is noted. The gallbladder is partially distended. A few dependent gallstones are seen. No biliary ductal dilatation is noted. Pancreas: Unremarkable. No pancreatic ductal dilatation or surrounding inflammatory changes. Spleen: Normal in size without focal abnormality. Adrenals/Urinary Tract: Adrenal glands are within normal limits. Kidneys demonstrate a normal enhancement pattern bilaterally. No obstructive changes are noted. Evaluation for calculi is limited due to contrast enhancement. The bladder is well distended with opacified and unopacified urine. Stomach/Bowel: Scattered diverticular change of the colon is noted without evidence of diverticulitis.  No obstructive changes are seen. The appendix is within normal limits. Small bowel and stomach are unremarkable. Lymphatic: No lymphadenopathy is seen. Reproductive: Prostate is unremarkable. Other: No abdominal wall hernia or abnormality. No abdominopelvic ascites. Musculoskeletal: L1 compression deformity is noted with evidence of prior vertebral augmentation. Review of the MIP images confirms the above findings. IMPRESSION: CTA of the chest: No evidence of aortic dissection or pulmonary embolism. Pulmonary emphysema is present. Pulmonary emphysema is an independent risk factor for lung cancer. Recommend patient be considered for lung cancer screening. [place]  Chief Financial Officer. Screening for Lung Cancer: [place]  Licensed Conveyancer. JAMA. 2021;325(10):962-970. CT of the abdomen and pelvis: No acute arterial abnormality is noted. Cholelithiasis without complicating factors. Diverticulosis without diverticulitis. Multiple chronic thoracic and lumbar compression deformity is noted. Electronically Signed   By: Oneil Devonshire M.D.   On: 10/14/2024 23:53   DG Chest 1 View Result Date: 10/14/2024 EXAM: 1 VIEW XRAY OF THE CHEST 10/14/2024 09:54:00 PM COMPARISON: Comparison with 09/27/2024. CLINICAL HISTORY: SOB. Pt reports increased SOB for the past week. Recently seen here 2 weeks ago for same problem. Pt reports O2 saturation dropping into 70s at home while ambulating on 2L. Pt presents tachypneic with increased work of breathing, two word answers only. Admitted for COPD exacerbation 2 weeks ago. FINDINGS: LUNGS AND PLEURA: No focal pulmonary opacity. No pulmonary edema. No pleural effusion. No pneumothorax. HEART AND MEDIASTINUM: No acute abnormality of the cardiac and mediastinal silhouettes. BONES AND SOFT TISSUES: No acute osseous abnormality. IMPRESSION: 1. No acute cardiopulmonary process. Electronically signed by: Norman Gatlin MD 10/14/2024 09:58 PM EDT RP Workstation:  [id number]   DG Chest Portable 1 View Result Date: 09/27/2024 CLINICAL DATA:  Shortness of breath. EXAM: PORTABLE CHEST 1 VIEW COMPARISON:  09/08/2024 and CT chest 05/29/2023. FINDINGS: Trachea is midline. Heart size normal. Left atrial occlusion device. Lungs are hyperinflated. There may be subtle interstitial prominence in lingula and left lower lobe. No dense airspace consolidation or pleural fluid. IMPRESSION: Question subtle interstitial prominence in the lingula and left lower lobe, raising suspicion for an infectious bronchiolitis. Electronically Signed   By: Newell Eke M.D.   On: 09/27/2024 09:01    Microbiology: Results for orders placed or performed during the hospital encounter of 10/14/24  Resp panel by RT-PCR (RSV, Flu A&B, Covid) Anterior Nasal Swab     Status: None   Collection Time: 10/14/24  9:25 PM   Specimen: Anterior Nasal Swab  Result Value Ref Range Status   SARS Coronavirus 2 by RT PCR NEGATIVE NEGATIVE Final    Comment: (NOTE) SARS-CoV-2 target nucleic acids are NOT DETECTED.  The SARS-CoV-2 RNA is generally detectable in upper respiratory specimens during the acute phase of infection. The lowest concentration of SARS-CoV-2 viral copies this assay can detect is 138 copies/mL. A negative result does not preclude SARS-Cov-2 infection and should not be used as the sole basis for treatment or other patient management decisions. A negative result may occur with  improper specimen collection/handling, submission of specimen other than nasopharyngeal swab, presence of viral mutation(s) within the areas targeted by this assay, and inadequate number of viral copies(<138 copies/mL). A negative result must be combined with clinical observations, patient history, and epidemiological information. The expected result is Negative.  Fact Sheet for Patients:  bloggercourse.com  Fact Sheet for Healthcare Providers:   seriousbroker.it  This test is no t yet approved or cleared by the [place]  FDA and  has been authorized for detection and/or diagnosis of SARS-CoV-2 by FDA under an Emergency Use Authorization (EUA). This EUA will remain  in effect (meaning this test can be used) for the duration of the COVID-19 declaration under Section 564(b)(1) of the Act, 21 U.S.C.section 360bbb-3(b)(1), unless the authorization is terminated  or revoked sooner.       Influenza A by PCR NEGATIVE NEGATIVE Final   Influenza B by PCR NEGATIVE NEGATIVE Final    Comment: (NOTE) The Xpert Xpress SARS-CoV-2/FLU/RSV plus assay is intended as an aid in the diagnosis of influenza from Nasopharyngeal swab specimens and should not be used as a sole basis for treatment. Nasal washings and aspirates are unacceptable for Xpert Xpress SARS-CoV-2/FLU/RSV testing.  Fact Sheet for Patients: bloggercourse.com  Fact Sheet for Healthcare Providers: seriousbroker.it  This test is not yet approved or cleared by the [place]  FDA and has been authorized for detection and/or diagnosis of SARS-CoV-2 by FDA under an Emergency Use Authorization (EUA). This EUA will remain in effect (meaning this test can be used) for the duration of the COVID-19 declaration under Section 564(b)(1) of the Act, 21 U.S.C. section 360bbb-3(b)(1), unless the authorization is terminated or revoked.     Resp Syncytial Virus by PCR NEGATIVE NEGATIVE Final    Comment: (NOTE) Fact Sheet for Patients: bloggercourse.com  Fact Sheet for Healthcare Providers: seriousbroker.it  This test is not yet approved or cleared by the [place]  FDA and has been authorized for detection and/or diagnosis of SARS-CoV-2 by FDA under an Emergency Use Authorization (EUA). This EUA will remain in effect (meaning this test can be used) for  the duration of the COVID-19 declaration under Section 564(b)(1) of the Act, 21 U.S.C. section 360bbb-3(b)(1), unless the authorization is terminated or revoked.  Performed at [facility], [address] [place], [place] [address]     Labs: CBC: Recent Labs  Lab 10/14/24 2124 10/16/24 0512  WBC 11.4* 12.7*  NEUTROABS 9.7*  --   HGB 12.8* 12.4*  HCT 42.4 41.5  MCV 78.7* 79.0*  PLT 284 254   Basic Metabolic Panel: Recent Labs  Lab 10/14/24 2124 10/16/24 0512  NA 139 139  K 4.3 4.1  CL 95* 97*  CO2 31 27  GLUCOSE 145* 141*  BUN 22 26*  CREATININE 1.06 0.90  CALCIUM  9.5 9.1   Liver Function Tests: No results for input(s): AST, ALT, ALKPHOS, BILITOT, PROT, ALBUMIN  in the last 168 hours. CBG: Recent Labs  Lab 10/18/24 0813 10/18/24 1142 10/18/24 1621 10/18/24 2208 10/19/24 0853  GLUCAP 136* 93 244* 182* 164*    Discharge time spent: greater than 30 minutes.  Signed: Medford Staheli S  Drusilla, MD [facility] 10/19/2024

## 2024-10-20 ENCOUNTER — Inpatient Hospital Stay (HOSPITAL_BASED_OUTPATIENT_CLINIC_OR_DEPARTMENT_OTHER)
Admission: EM | Admit: 2024-10-20 | Discharge: 2024-10-24 | DRG: 191 | Disposition: A | Discharge status: Home / Self Care | Attending: Internal Medicine | Admitting: Internal Medicine

## 2024-10-20 ENCOUNTER — Emergency Department (HOSPITAL_BASED_OUTPATIENT_CLINIC_OR_DEPARTMENT_OTHER)

## 2024-10-20 ENCOUNTER — Encounter: Payer: Self-pay | Admitting: Family Medicine

## 2024-10-20 ENCOUNTER — Encounter (HOSPITAL_BASED_OUTPATIENT_CLINIC_OR_DEPARTMENT_OTHER): Payer: Self-pay

## 2024-10-20 ENCOUNTER — Other Ambulatory Visit: Payer: Self-pay

## 2024-10-20 DIAGNOSIS — Z7984 Long term (current) use of oral hypoglycemic drugs: Secondary | ICD-10-CM

## 2024-10-20 DIAGNOSIS — J9611 Chronic respiratory failure with hypoxia: Secondary | ICD-10-CM | POA: Diagnosis present

## 2024-10-20 DIAGNOSIS — E119 Type 2 diabetes mellitus without complications: Secondary | ICD-10-CM | POA: Diagnosis present

## 2024-10-20 DIAGNOSIS — K219 Gastro-esophageal reflux disease without esophagitis: Secondary | ICD-10-CM | POA: Diagnosis present

## 2024-10-20 DIAGNOSIS — Z8249 Family history of ischemic heart disease and other diseases of the circulatory system: Secondary | ICD-10-CM | POA: Diagnosis not present

## 2024-10-20 DIAGNOSIS — I5032 Chronic diastolic (congestive) heart failure: Secondary | ICD-10-CM | POA: Diagnosis present

## 2024-10-20 DIAGNOSIS — E7800 Pure hypercholesterolemia, unspecified: Secondary | ICD-10-CM | POA: Diagnosis present

## 2024-10-20 DIAGNOSIS — Z9109 Other allergy status, other than to drugs and biological substances: Secondary | ICD-10-CM

## 2024-10-20 DIAGNOSIS — Z9981 Dependence on supplemental oxygen: Secondary | ICD-10-CM

## 2024-10-20 DIAGNOSIS — F419 Anxiety disorder, unspecified: Secondary | ICD-10-CM | POA: Diagnosis not present

## 2024-10-20 DIAGNOSIS — R918 Other nonspecific abnormal finding of lung field: Secondary | ICD-10-CM | POA: Diagnosis not present

## 2024-10-20 DIAGNOSIS — I110 Hypertensive heart disease with heart failure: Secondary | ICD-10-CM | POA: Diagnosis present

## 2024-10-20 DIAGNOSIS — I480 Paroxysmal atrial fibrillation: Secondary | ICD-10-CM | POA: Diagnosis present

## 2024-10-20 DIAGNOSIS — Z7982 Long term (current) use of aspirin: Secondary | ICD-10-CM

## 2024-10-20 DIAGNOSIS — J441 Chronic obstructive pulmonary disease with (acute) exacerbation: Principal | ICD-10-CM | POA: Diagnosis present

## 2024-10-20 DIAGNOSIS — Z87891 Personal history of nicotine dependence: Secondary | ICD-10-CM | POA: Diagnosis not present

## 2024-10-20 DIAGNOSIS — Z95818 Presence of other cardiac implants and grafts: Secondary | ICD-10-CM | POA: Diagnosis not present

## 2024-10-20 DIAGNOSIS — E039 Hypothyroidism, unspecified: Secondary | ICD-10-CM | POA: Diagnosis present

## 2024-10-20 DIAGNOSIS — J9621 Acute and chronic respiratory failure with hypoxia: Principal | ICD-10-CM

## 2024-10-20 DIAGNOSIS — Z79899 Other long term (current) drug therapy: Secondary | ICD-10-CM

## 2024-10-20 DIAGNOSIS — Z8616 Personal history of COVID-19: Secondary | ICD-10-CM | POA: Diagnosis not present

## 2024-10-20 DIAGNOSIS — G4733 Obstructive sleep apnea (adult) (pediatric): Secondary | ICD-10-CM | POA: Diagnosis present

## 2024-10-20 DIAGNOSIS — Z79890 Hormone replacement therapy (postmenopausal): Secondary | ICD-10-CM | POA: Diagnosis not present

## 2024-10-20 DIAGNOSIS — Z888 Allergy status to other drugs, medicaments and biological substances status: Secondary | ICD-10-CM

## 2024-10-20 DIAGNOSIS — I700 Atherosclerosis of aorta: Secondary | ICD-10-CM | POA: Diagnosis present

## 2024-10-20 DIAGNOSIS — Z1152 Encounter for screening for COVID-19: Secondary | ICD-10-CM

## 2024-10-20 DIAGNOSIS — R0602 Shortness of breath: Secondary | ICD-10-CM | POA: Diagnosis present

## 2024-10-20 DIAGNOSIS — Z7951 Long term (current) use of inhaled steroids: Secondary | ICD-10-CM

## 2024-10-20 DIAGNOSIS — F411 Generalized anxiety disorder: Secondary | ICD-10-CM | POA: Diagnosis present

## 2024-10-20 DIAGNOSIS — Z881 Allergy status to other antibiotic agents status: Secondary | ICD-10-CM | POA: Diagnosis not present

## 2024-10-20 DIAGNOSIS — M810 Age-related osteoporosis without current pathological fracture: Secondary | ICD-10-CM | POA: Diagnosis present

## 2024-10-20 DIAGNOSIS — Z7952 Long term (current) use of systemic steroids: Secondary | ICD-10-CM

## 2024-10-20 LAB — BASIC METABOLIC PANEL WITH GFR
Anion gap: 11 (ref 5–15)
BUN: 26 mg/dL — ABNORMAL HIGH (ref 8–23)
CO2: 33 mmol/L — ABNORMAL HIGH (ref 22–32)
Calcium: 9.3 mg/dL (ref 8.9–10.3)
Chloride: 100 mmol/L (ref 98–111)
Creatinine, Ser: 0.89 mg/dL (ref 0.61–1.24)
GFR, Estimated: >60 mL/min (ref >60)
Glucose, Bld: 122 mg/dL — ABNORMAL HIGH (ref 70–99)
Potassium: 4.2 mmol/L (ref 3.5–5.1)
Sodium: 143 mmol/L (ref 135–145)

## 2024-10-20 LAB — CBC
HCT: 42.8 % (ref 39.0–52.0)
Hemoglobin: 12.6 g/dL — ABNORMAL LOW (ref 13.0–17.0)
MCH: 24 pg — ABNORMAL LOW (ref 26.0–34.0)
MCHC: 29.4 g/dL — ABNORMAL LOW (ref 30.0–36.0)
MCV: 81.4 fL (ref 80.0–100.0)
Platelets: 267 K/uL (ref 150–400)
RBC: 5.26 MIL/uL (ref 4.22–5.81)
RDW: 18.1 % — ABNORMAL HIGH (ref 11.5–15.5)
WBC: 13.8 K/uL — ABNORMAL HIGH (ref 4.0–10.5)
nRBC: 0 % (ref 0.0–0.2)

## 2024-10-20 LAB — TROPONIN T, HIGH SENSITIVITY
Troponin T High Sensitivity: 18 ng/L (ref 0–19)
Troponin T High Sensitivity: 18 ng/L (ref 0–19)

## 2024-10-20 MED ORDER — METHYLPREDNISOLONE SODIUM SUCC 125 MG IJ SOLR
125.0000 mg | Freq: Every day | INTRAMUSCULAR | Status: DC
  Administered 2024-10-20 – 2024-10-22 (×3): 125 mg via INTRAVENOUS
  Filled 2024-10-20 (×3): qty 2

## 2024-10-20 MED ORDER — ALBUTEROL SULFATE (2.5 MG/3ML) 0.083% IN NEBU
2.5000 mg | INHALATION_SOLUTION | RESPIRATORY_TRACT | Status: DC
  Administered 2024-10-20 (×2): 2.5 mg via RESPIRATORY_TRACT
  Filled 2024-10-20 (×2): qty 3

## 2024-10-20 MED ORDER — ALBUTEROL SULFATE (2.5 MG/3ML) 0.083% IN NEBU
2.5000 mg | INHALATION_SOLUTION | RESPIRATORY_TRACT | Status: DC | PRN
  Administered 2024-10-20: 2.5 mg via RESPIRATORY_TRACT
  Filled 2024-10-20: qty 3

## 2024-10-20 MED ORDER — LORAZEPAM 2 MG/ML IJ SOLN
1.0000 mg | Freq: Once | INTRAMUSCULAR | Status: AC
  Administered 2024-10-20: 1 mg via INTRAVENOUS
  Filled 2024-10-20: qty 1

## 2024-10-20 MED ORDER — ALBUTEROL SULFATE (2.5 MG/3ML) 0.083% IN NEBU
INHALATION_SOLUTION | RESPIRATORY_TRACT | Status: AC
  Administered 2024-10-20: 10 mg
  Filled 2024-10-20: qty 12

## 2024-10-20 MED ORDER — IPRATROPIUM BROMIDE 0.02 % IN SOLN
RESPIRATORY_TRACT | Status: AC
Start: 2024-10-20 — End: 2024-10-20
  Administered 2024-10-20: 0.5 mg
  Filled 2024-10-20: qty 2.5

## 2024-10-20 MED ORDER — IPRATROPIUM-ALBUTEROL 0.5-2.5 (3) MG/3ML IN SOLN
3.0000 mL | RESPIRATORY_TRACT | Status: DC
  Administered 2024-10-20 – 2024-10-21 (×6): 3 mL via RESPIRATORY_TRACT
  Filled 2024-10-20 (×6): qty 3

## 2024-10-20 NOTE — ED Notes (Signed)
 RT assessment done at this time and treatment orders placed for admission. Duoneb q4 and Albuterol  Q2 prn. Also placed an order for CPAP at bedtime as he wears that at home. RT to monitor as needed

## 2024-10-20 NOTE — ED Notes (Signed)
 Patient was discharged from hospital on 2L yesterday. Wife stated he has been having to wear 3-4L to keep SATs up. Treatment done at home prior to arrival. Patient placed on 10mg  CAT on air and 2 LNC. BBS diminished. MD agreed with plan.

## 2024-10-20 NOTE — ED Provider Notes (Signed)
 Emergency Department Provider Note   I have reviewed the triage vital signs and the nursing notes.   HISTORY  Chief Complaint Shortness of Breath   HPI Paul [name] is a 78 y.o. male with past history of COPD presents to the emergency department with worsening shortness of breath this morning.  Some chest tightness.  He was discharged in the hospital yesterday after admission for COPD.  He is currently on steroids.  He has frequent ED evaluations for COPD exacerbations.  His wife gave a nebulizer prior to arrival with no effect.  Patient reports feeling like I just can't get any air.    Past Medical History:  Diagnosis Date   Anemia    Aortic atherosclerosis    Aspiration pneumonia due to inhalation of vomitus (HCC) 02/03/2022   Atrial fibrillation (HCC)    Back pain    COPD (chronic obstructive pulmonary disease) (HCC)    COPD exacerbation (HCC) 12/27/2021   COVID-19 12/24/2020   Diabetes mellitus without complication (HCC)    Dizziness 04/08/2022   Elevated PSA    GAD (generalized anxiety disorder)    GERD (gastroesophageal reflux disease)    High cholesterol    Hypertension    Lingular pneumonia 01/05/2021   See cxr 01/06/20 rx with zpak/ cefipime x 5 days and then developed purulent sputum again 01/12/21 so rx levcaquin 500 mg daily x 7 days then return for cxr before more    Lung nodule < 6cm on CT 05/29/2016   Osteoporosis    Oxygen  deficiency    Pneumonia    January 2022   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 Cindie with a 20 mm Watchman FLX device   RSV bronchitis 01/27/2022   Vertebral compression fracture (HCC) 05/29/2019   T8 compression fracture noted on CT scan 05/28/2019 inpatient with chronic steroid dependent COPD - rx calcitonin nasal spray rx per PCP     Review of Systems  Constitutional: No fever/chills Cardiovascular: Denies chest pain. Respiratory: Positive shortness of  breath. Gastrointestinal: No abdominal pain.  No nausea, no vomiting.   Skin: Negative for rash. Neurological: Negative for headaches.  ____________________________________________   PHYSICAL EXAM:  VITAL SIGNS: ED Triage Vitals  Encounter Vitals Group     Pulse Rate 10/20/24 0824 (!) 116     Resp 10/20/24 0824 (!) 34     Temp 10/20/24 0824 97.7 F (36.5 C)     Temp Source 10/20/24 0824 Oral     SpO2 10/20/24 0824 98 %   Constitutional: Alert and oriented. Patient appears unwell with increased work of breathing.  Eyes: Conjunctivae are normal.  Head: Atraumatic. Nose: No congestion/rhinnorhea. Mouth/Throat: Mucous membranes are moist.  Neck: No stridor.   Cardiovascular: Tachycardia. Good peripheral circulation. Grossly normal heart sounds.   Respiratory: Increased respiratory effort.  No retractions. Lungs diminished in the lower lobes.  Gastrointestinal: Soft and nontender. No distention.  Musculoskeletal: No lower extremity tenderness nor edema. No gross deformities of extremities. Neurologic:  Normal speech and language. Skin:  Skin is warm, dry and intact. No rash noted.  ____________________________________________   LABS (all labs ordered are listed, but only abnormal results are displayed)  Labs Reviewed  BASIC METABOLIC PANEL WITH GFR - Abnormal; Notable for the following components:      Result Value   CO2 33 (*)    Glucose, Bld 122 (*)    BUN 26 (*)    All other components within  normal limits  CBC - Abnormal; Notable for the following components:   WBC 13.8 (*)    Hemoglobin 12.6 (*)    MCH 24.0 (*)    MCHC 29.4 (*)    RDW 18.1 (*)    All other components within normal limits  CBC WITH DIFFERENTIAL/PLATELET - Abnormal; Notable for the following components:   Hemoglobin 10.7 (*)    HCT 35.4 (*)    MCV 78.7 (*)    MCH 23.8 (*)    RDW 17.7 (*)    Neutro Abs 8.3 (*)    Lymphs Abs 0.5 (*)    All other components within normal limits  BASIC METABOLIC  PANEL WITH GFR - Abnormal; Notable for the following components:   Chloride 97 (*)    Glucose, Bld 115 (*)    Calcium  8.4 (*)    All other components within normal limits  EXPECTORATED SPUTUM ASSESSMENT W GRAM STAIN, RFLX TO RESP C  MAGNESIUM   PHOSPHORUS  TROPONIN T, HIGH SENSITIVITY  TROPONIN T, HIGH SENSITIVITY   ____________________________________________  EKG   EKG Interpretation Date/Time:  Wednesday October 20 2024 08:36:16 EDT Ventricular Rate:  115 PR Interval:  124 QRS Duration:  97 QT Interval:  301 QTC Calculation: 417 R Axis:   80  Text Interpretation: Sinus tachycardia Borderline repolarization abnormality Baseline wander in lead(s) V5 Confirmed by Darra Chew [phone]) on 10/20/2024 8:41:05 AM        ____________________________________________  RADIOLOGY  DG Chest Port 1 View Result Date: 10/20/2024 EXAM: 1 VIEW(S) XRAY OF THE CHEST 10/20/2024 08:57:47 AM COMPARISON: 10/14/2024 CLINICAL HISTORY: Shortness of breath. Reports [facility] since this morning. Discharged from [facility] yesterday for COPD exacerbation. States was sent home on 2L [place] Denies chest pain, dizziness, N/V, fevers RRT in triage FINDINGS: LUNGS AND PLEURA: Lung hyperinflation. No focal pulmonary opacity. No pulmonary edema. No pleural effusion. No pneumothorax. HEART AND MEDIASTINUM: Atherosclerotic calcifications. Left atrial appendage occlusion device noted. No acute abnormality of the cardiac and mediastinal silhouettes. BONES AND SOFT TISSUES: No acute osseous abnormality. IMPRESSION: 1. Lung hyperinflation, consistent with COPD. 2. No acute findings. Electronically signed by: Evalene Coho MD 10/20/2024 09:17 AM EDT RP Workstation: [id number]    ____________________________________________   PROCEDURES  Procedure(s) performed:   Procedures  CRITICAL CARE Performed by: Chew KANDICE Darra Total critical care time: 75 minutes Critical care time was exclusive of separately billable  procedures and treating other patients. Critical care was necessary to treat or prevent imminent or life-threatening deterioration. Critical care was time spent personally by me on the following activities: development of treatment plan with patient and/or surrogate as well as nursing, discussions with consultants, evaluation of patient's response to treatment, examination of patient, obtaining history from patient or surrogate, ordering and performing treatments and interventions, ordering and review of laboratory studies, ordering and review of radiographic studies, pulse oximetry and re-evaluation of patient's condition.  Chew Darra, MD Emergency Medicine  ____________________________________________   INITIAL IMPRESSION / ASSESSMENT AND PLAN / ED COURSE  Pertinent labs & imaging results that were available during my care of the patient were reviewed by me and considered in my medical decision making (see chart for details).   This patient is Presenting for Evaluation of SOB, which does require a range of treatment options, and is a complaint that involves a high risk of morbidity and mortality.  The Differential Diagnoses includes but is not exclusive to acute coronary syndrome, aortic dissection, pulmonary embolism, cardiac tamponade, community-acquired pneumonia, pericarditis, musculoskeletal chest  wall pain, etc.   Critical Interventions-    Medications  methylPREDNISolone  sodium succinate  (SOLU-MEDROL ) 125 mg/2 mL injection 125 mg (125 mg Intravenous Given 10/20/24 0957)  albuterol  (PROVENTIL ) (2.5 MG/3ML) 0.083% nebulizer solution 2.5 mg (2.5 mg Nebulization Given 10/20/24 2203)  ipratropium-albuterol  (DUONEB) 0.5-2.5 (3) MG/3ML nebulizer solution 3 mL (3 mLs Nebulization Given 10/21/24 0445)  azithromycin  (ZITHROMAX ) 500 mg in sodium chloride  0.9 % 250 mL IVPB (500 mg Intravenous New Bag/Given 10/21/24 0151)  enoxaparin  (LOVENOX ) injection 40 mg (has no administration in time  range)  acetaminophen  (TYLENOL ) tablet 650 mg (has no administration in time range)  prochlorperazine (COMPAZINE) injection 5 mg (has no administration in time range)  polyethylene glycol (MIRALAX  / GLYCOLAX ) packet 17 g (has no administration in time range)  melatonin tablet 5 mg (has no administration in time range)  guaiFENesin -dextromethorphan  (ROBITUSSIN DM) 100-10 MG/5ML syrup 5 mL (has no administration in time range)  ALPRAZolam  (XANAX ) tablet 0.5 mg (has no administration in time range)  atorvastatin  (LIPITOR) tablet 40 mg (has no administration in time range)  budesonide -glycopyrrolate -formoterol  (BREZTRI ) 160-9-4.8 MCG/ACT inhaler 2 puff (has no administration in time range)  levothyroxine  (SYNTHROID ) tablet 25 mcg (25 mcg Oral Given 10/21/24 0612)  omega-3 acid ethyl esters (LOVAZA ) capsule 1 g (1 g Oral Not Given 10/21/24 0407)  pantoprazole  (PROTONIX ) EC tablet 40 mg (has no administration in time range)  sertraline  (ZOLOFT ) tablet 50 mg (has no administration in time range)  albuterol  (PROVENTIL ) (2.5 MG/3ML) 0.083% nebulizer solution (10 mg  Given 10/20/24 0827)  ipratropium (ATROVENT ) 0.02 % nebulizer solution (0.5 mg  Given 10/20/24 0827)  LORazepam  (ATIVAN ) injection 1 mg (1 mg Intravenous Given 10/20/24 0858)  melatonin tablet 5 mg (5 mg Oral Given 10/21/24 0144)  magnesium  oxide (MAG-OX) tablet 400 mg (400 mg Oral Given 10/21/24 0144)    Reassessment after intervention: SOB improved.    I did obtain Additional Historical Information from wife at bedside.  I decided to review pertinent External Data, and in summary discharged from the hospital yesterday for COPD flare.   Clinical Laboratory Tests Ordered, included CBC with leukocytosis of unclear significance given COPD admit with steroids recently. No AKI on BMP. Troponin negative.   Radiologic Tests Ordered, included CXR. I independently interpreted the images and agree with radiology interpretation.   Cardiac  Monitor Tracing which shows sinus tachycardia.    Social Determinants of Health Risk patient is not an active smoker.   Consult complete with [facility]. Plan for admit.   Medical Decision Making: Summary:  Patient arrives to the emergency department with shortness of breath.  Diminished lung sounds consistent with prior evaluations of the same patient in the ED.  Seems most consistent with COPD exacerbation.  Started on hour-Muscab Brenneman continuous.  He is currently on steroid medications.  Plan for treatment and monitoring in the ED.   Reevaluation with update and discussion with patient. Work of breathing improving but continues to require O2 and not back to baseline. Plan for re-admit. Patient and wife in agreement.   Patient's presentation is most consistent with acute presentation with potential threat to life or bodily function.   Disposition: admit  ____________________________________________  FINAL CLINICAL IMPRESSION(S) / ED DIAGNOSES  Final diagnoses:  Acute on chronic respiratory failure with hypoxia (HCC)    Note:  This document was prepared using Dragon voice recognition software and may include unintentional dictation errors.  Fonda Law, MD, [facility] Emergency Medicine    Petrea Fredenburg, Fonda MATSU, MD 10/21/24 [phone]

## 2024-10-20 NOTE — Plan of Care (Signed)
 Pmh COPD on noctural oxygen  presents after getting back home from being discharged from the hospital yesterday for COPD exacerbation.  He was discharged home on 2 L.   Last night patient reportedly aspirated some food and had worsening shortness of breath. Reported to be requiring 3 to 4 L to keep O2 saturations up.  Labs note WBC 13.8. Chest x-ray notes hyperinflation of the lungs without acute abnormality.  Patient was given Solu-Medrol  125 mg IV, Ativan  1 mg IV, and albuterol  nebulizer treatment.

## 2024-10-20 NOTE — ED Triage Notes (Addendum)
 Reports [facility] since this morning. Discharged from [facility] yesterday for COPD exacerbation. States was sent home on 2L [place]  Denies chest pain, dizziness, N/V, fevers  RRT in triage

## 2024-10-20 NOTE — ED Notes (Signed)
 Patient asked me to increase his O2 to 3L. Stated he still felt very SOB. SAT 100% prior to, but I increased it. MD aware. Patient to get something for anxiety as well

## 2024-10-20 NOTE — ED Notes (Signed)
 Pt had food delivered and is eating dinner.

## 2024-10-21 DIAGNOSIS — Z8616 Personal history of COVID-19: Secondary | ICD-10-CM | POA: Diagnosis not present

## 2024-10-21 DIAGNOSIS — Z881 Allergy status to other antibiotic agents status: Secondary | ICD-10-CM | POA: Diagnosis not present

## 2024-10-21 DIAGNOSIS — E119 Type 2 diabetes mellitus without complications: Secondary | ICD-10-CM | POA: Diagnosis present

## 2024-10-21 DIAGNOSIS — I700 Atherosclerosis of aorta: Secondary | ICD-10-CM | POA: Diagnosis present

## 2024-10-21 DIAGNOSIS — M810 Age-related osteoporosis without current pathological fracture: Secondary | ICD-10-CM | POA: Diagnosis present

## 2024-10-21 DIAGNOSIS — Z1152 Encounter for screening for COVID-19: Secondary | ICD-10-CM | POA: Diagnosis not present

## 2024-10-21 DIAGNOSIS — F411 Generalized anxiety disorder: Secondary | ICD-10-CM | POA: Diagnosis present

## 2024-10-21 DIAGNOSIS — E039 Hypothyroidism, unspecified: Secondary | ICD-10-CM | POA: Diagnosis present

## 2024-10-21 DIAGNOSIS — F419 Anxiety disorder, unspecified: Secondary | ICD-10-CM | POA: Diagnosis not present

## 2024-10-21 DIAGNOSIS — J441 Chronic obstructive pulmonary disease with (acute) exacerbation: Secondary | ICD-10-CM

## 2024-10-21 DIAGNOSIS — R0602 Shortness of breath: Secondary | ICD-10-CM | POA: Diagnosis present

## 2024-10-21 DIAGNOSIS — I5032 Chronic diastolic (congestive) heart failure: Secondary | ICD-10-CM | POA: Diagnosis present

## 2024-10-21 DIAGNOSIS — G4733 Obstructive sleep apnea (adult) (pediatric): Secondary | ICD-10-CM

## 2024-10-21 DIAGNOSIS — Z87891 Personal history of nicotine dependence: Secondary | ICD-10-CM | POA: Diagnosis not present

## 2024-10-21 DIAGNOSIS — Z7982 Long term (current) use of aspirin: Secondary | ICD-10-CM | POA: Diagnosis not present

## 2024-10-21 DIAGNOSIS — Z9981 Dependence on supplemental oxygen: Secondary | ICD-10-CM | POA: Diagnosis not present

## 2024-10-21 DIAGNOSIS — K219 Gastro-esophageal reflux disease without esophagitis: Secondary | ICD-10-CM | POA: Diagnosis present

## 2024-10-21 DIAGNOSIS — E7800 Pure hypercholesterolemia, unspecified: Secondary | ICD-10-CM | POA: Diagnosis present

## 2024-10-21 DIAGNOSIS — Z95818 Presence of other cardiac implants and grafts: Secondary | ICD-10-CM | POA: Diagnosis not present

## 2024-10-21 DIAGNOSIS — Z888 Allergy status to other drugs, medicaments and biological substances status: Secondary | ICD-10-CM | POA: Diagnosis not present

## 2024-10-21 DIAGNOSIS — J9611 Chronic respiratory failure with hypoxia: Secondary | ICD-10-CM | POA: Diagnosis present

## 2024-10-21 DIAGNOSIS — I480 Paroxysmal atrial fibrillation: Secondary | ICD-10-CM | POA: Diagnosis present

## 2024-10-21 DIAGNOSIS — Z8249 Family history of ischemic heart disease and other diseases of the circulatory system: Secondary | ICD-10-CM | POA: Diagnosis not present

## 2024-10-21 DIAGNOSIS — I110 Hypertensive heart disease with heart failure: Secondary | ICD-10-CM | POA: Diagnosis present

## 2024-10-21 DIAGNOSIS — Z79890 Hormone replacement therapy (postmenopausal): Secondary | ICD-10-CM | POA: Diagnosis not present

## 2024-10-21 DIAGNOSIS — Z9109 Other allergy status, other than to drugs and biological substances: Secondary | ICD-10-CM | POA: Diagnosis not present

## 2024-10-21 LAB — BASIC METABOLIC PANEL WITH GFR
Anion gap: 12 (ref 5–15)
BUN: 21 mg/dL (ref 8–23)
CO2: 32 mmol/L (ref 22–32)
Calcium: 8.4 mg/dL — ABNORMAL LOW (ref 8.9–10.3)
Chloride: 97 mmol/L — ABNORMAL LOW (ref 98–111)
Creatinine, Ser: 0.88 mg/dL (ref 0.61–1.24)
GFR, Estimated: >60 mL/min (ref >60)
Glucose, Bld: 115 mg/dL — ABNORMAL HIGH (ref 70–99)
Potassium: 4.6 mmol/L (ref 3.5–5.1)
Sodium: 141 mmol/L (ref 135–145)

## 2024-10-21 LAB — RESPIRATORY PANEL BY PCR

## 2024-10-21 LAB — CBC WITH DIFFERENTIAL/PLATELET
Abs Immature Granulocytes: 0.07 K/uL (ref 0.00–0.07)
Basophils Absolute: 0 K/uL (ref 0.0–0.1)
Basophils Relative: 0 %
Eosinophils Absolute: 0 K/uL (ref 0.0–0.5)
Eosinophils Relative: 0 %
HCT: 35.4 % — ABNORMAL LOW (ref 39.0–52.0)
Hemoglobin: 10.7 g/dL — ABNORMAL LOW (ref 13.0–17.0)
Immature Granulocytes: 1 %
Lymphocytes Relative: 5 %
Lymphs Abs: 0.5 K/uL — ABNORMAL LOW (ref 0.7–4.0)
MCH: 23.8 pg — ABNORMAL LOW (ref 26.0–34.0)
MCHC: 30.2 g/dL (ref 30.0–36.0)
MCV: 78.7 fL — ABNORMAL LOW (ref 80.0–100.0)
Monocytes Absolute: 0.4 K/uL (ref 0.1–1.0)
Monocytes Relative: 5 %
Neutro Abs: 8.3 K/uL — ABNORMAL HIGH (ref 1.7–7.7)
Neutrophils Relative %: 89 %
Platelets: 235 K/uL (ref 150–400)
RBC: 4.5 MIL/uL (ref 4.22–5.81)
RDW: 17.7 % — ABNORMAL HIGH (ref 11.5–15.5)
WBC: 9.3 K/uL (ref 4.0–10.5)
nRBC: 0 % (ref 0.0–0.2)

## 2024-10-21 LAB — PHOSPHORUS: Phosphorus: 3.2 mg/dL (ref 2.5–4.6)

## 2024-10-21 LAB — GLUCOSE, CAPILLARY
Glucose-Capillary: 144 mg/dL — ABNORMAL HIGH (ref 70–99)
Glucose-Capillary: 160 mg/dL — ABNORMAL HIGH (ref 70–99)

## 2024-10-21 LAB — MAGNESIUM: Magnesium: 2.2 mg/dL (ref 1.7–2.4)

## 2024-10-21 MED ORDER — BUDESON-GLYCOPYRROL-FORMOTEROL 160-9-4.8 MCG/ACT IN AERO
2.0000 | INHALATION_SPRAY | Freq: Two times a day (BID) | RESPIRATORY_TRACT | Status: DC
  Administered 2024-10-21: 2 via RESPIRATORY_TRACT
  Filled 2024-10-21: qty 5.9

## 2024-10-21 MED ORDER — ATORVASTATIN CALCIUM 40 MG PO TABS
40.0000 mg | ORAL_TABLET | Freq: Every day | ORAL | Status: DC
  Administered 2024-10-21 – 2024-10-23 (×3): 40 mg via ORAL
  Filled 2024-10-21 (×3): qty 1

## 2024-10-21 MED ORDER — IPRATROPIUM-ALBUTEROL 0.5-2.5 (3) MG/3ML IN SOLN
3.0000 mL | RESPIRATORY_TRACT | Status: DC | PRN
Start: 2024-10-21 — End: 2024-10-24
  Administered 2024-10-21: 3 mL via RESPIRATORY_TRACT

## 2024-10-21 MED ORDER — MELATONIN 5 MG PO TABS
5.0000 mg | ORAL_TABLET | ORAL | Status: AC
  Administered 2024-10-21: 5 mg via ORAL
  Filled 2024-10-21: qty 1

## 2024-10-21 MED ORDER — IPRATROPIUM-ALBUTEROL 0.5-2.5 (3) MG/3ML IN SOLN: 3.0000 mL | Freq: Four times a day (QID) | RESPIRATORY_TRACT | Status: DC

## 2024-10-21 MED ORDER — ACETAMINOPHEN 325 MG PO TABS: 650.0000 mg | ORAL_TABLET | Freq: Four times a day (QID) | ORAL | Status: DC | PRN

## 2024-10-21 MED ORDER — MELATONIN 5 MG PO TABS
5.0000 mg | ORAL_TABLET | Freq: Every evening | ORAL | Status: DC | PRN
  Administered 2024-10-21 – 2024-10-23 (×3): 5 mg via ORAL
  Filled 2024-10-21 (×3): qty 1

## 2024-10-21 MED ORDER — REVEFENACIN 175 MCG/3ML IN SOLN
175.0000 ug | Freq: Every day | RESPIRATORY_TRACT | Status: DC
  Administered 2024-10-22 – 2024-10-24 (×3): 175 ug via RESPIRATORY_TRACT
  Filled 2024-10-21 (×3): qty 3

## 2024-10-21 MED ORDER — PANTOPRAZOLE SODIUM 40 MG PO TBEC
40.0000 mg | DELAYED_RELEASE_TABLET | Freq: Two times a day (BID) | ORAL | Status: DC
  Administered 2024-10-21 – 2024-10-24 (×7): 40 mg via ORAL
  Filled 2024-10-21 (×7): qty 1

## 2024-10-21 MED ORDER — SODIUM CHLORIDE 0.9 % IV SOLN
500.0000 mg | INTRAVENOUS | Status: DC
  Administered 2024-10-21 – 2024-10-23 (×3): 500 mg via INTRAVENOUS
  Filled 2024-10-21 (×3): qty 5

## 2024-10-21 MED ORDER — OMEGA-3-ACID ETHYL ESTERS 1 G PO CAPS
1.0000 g | ORAL_CAPSULE | Freq: Every day | ORAL | Status: DC
  Administered 2024-10-22 – 2024-10-24 (×3): 1 g via ORAL
  Filled 2024-10-21 (×3): qty 1

## 2024-10-21 MED ORDER — SERTRALINE HCL 50 MG PO TABS
50.0000 mg | ORAL_TABLET | Freq: Every day | ORAL | Status: DC
  Administered 2024-10-21 – 2024-10-23 (×3): 50 mg via ORAL
  Filled 2024-10-21 (×3): qty 1

## 2024-10-21 MED ORDER — POLYETHYLENE GLYCOL 3350 17 G PO PACK: 17.0000 g | PACK | Freq: Every day | ORAL | Status: DC | PRN

## 2024-10-21 MED ORDER — LEVOTHYROXINE SODIUM 25 MCG PO TABS
25.0000 ug | ORAL_TABLET | Freq: Every day | ORAL | Status: DC
  Administered 2024-10-21 – 2024-10-24 (×4): 25 ug via ORAL
  Filled 2024-10-21 (×4): qty 1

## 2024-10-21 MED ORDER — PROCHLORPERAZINE EDISYLATE 10 MG/2ML IJ SOLN: 5.0000 mg | Freq: Four times a day (QID) | INTRAMUSCULAR | Status: DC | PRN

## 2024-10-21 MED ORDER — GUAIFENESIN-DM 100-10 MG/5ML PO SYRP: 5.0000 mL | ORAL_SOLUTION | ORAL | Status: DC | PRN

## 2024-10-21 MED ORDER — ALPRAZOLAM 0.5 MG PO TABS
0.5000 mg | ORAL_TABLET | Freq: Two times a day (BID) | ORAL | Status: DC | PRN
Start: 2024-10-21 — End: 2024-10-24

## 2024-10-21 MED ORDER — BUDESONIDE 0.5 MG/2ML IN SUSP
0.5000 mg | Freq: Two times a day (BID) | RESPIRATORY_TRACT | Status: DC
  Administered 2024-10-21 – 2024-10-24 (×6): 0.5 mg via RESPIRATORY_TRACT
  Filled 2024-10-21 (×6): qty 2

## 2024-10-21 MED ORDER — MAGNESIUM OXIDE -MG SUPPLEMENT 400 (240 MG) MG PO TABS
400.0000 mg | ORAL_TABLET | ORAL | Status: AC
  Administered 2024-10-21: 400 mg via ORAL
  Filled 2024-10-21: qty 1

## 2024-10-21 MED ORDER — ARFORMOTEROL TARTRATE 15 MCG/2ML IN NEBU
15.0000 ug | INHALATION_SOLUTION | Freq: Two times a day (BID) | RESPIRATORY_TRACT | Status: DC
  Administered 2024-10-21 – 2024-10-24 (×6): 15 ug via RESPIRATORY_TRACT
  Filled 2024-10-21 (×6): qty 2

## 2024-10-21 MED ORDER — ENOXAPARIN SODIUM 40 MG/0.4ML IJ SOSY
40.0000 mg | PREFILLED_SYRINGE | INTRAMUSCULAR | Status: DC
  Administered 2024-10-21 – 2024-10-24 (×4): 40 mg via SUBCUTANEOUS
  Filled 2024-10-21 (×4): qty 0.4

## 2024-10-21 NOTE — H&P (Addendum)
 History and Physical  Paul [name] FMW:[id number] DOB: [birth year] DOA: 10/20/2024  Referring physician: Accepted by Dr. Claudene,   PCP: [name], MD  Outpatient Specialists: Pulmonary, Dr. Darlean. Patient coming from: Home to [facility] ED.  Chief Complaint: Shortness of breath.  HPI: Paul [name] is a 78 y.o. male with medical history significant for COPD with nocturnal hypoxia on 2 L nightly, OSA on CPAP, chronic HFpEF, hypertension, type 2 diabetes, paroxysmal A-fib with Watchman device, hypothyroidism, chronic anxiety, former smoker, quit tobacco use more than 17 years ago, who presents to the ER with complaints of acute shortness of breath that woke him out of his sleep around 4 AM on 10/20/2024.  States his oxygen  saturation was 77% on home 2.5 L oxygen  by nasal cannula.  Also endorses increasing cough with greenish sputum production.  His wife brought him to [facility] ED for further management.   In the ER, tachypneic with O2 saturation of 100% on 3 L nasal cannula.  Chest x-ray with no evidence of pneumonia.  The patient received bronchodilator nebulizers and IV Solu-Medrol  in the ER.  TRH, hospitalist service, was asked to admit.  Accepted by Dr. Claudene and transferred to [facility] telemetry unit as observation status.  At the time of this visit, the patient is on CPAP.  States his breathing has improved after nebulizers treatment.  Has an appointment with his pulmonologist Dr. Darlean next Thursday, 10/28/2024.  The patient would benefit from pulmonary consult while in the hospital in order to address his COPD with frequent exacerbations.  5 presentations to the ER in the the last 5 weeks for COPD exacerbation.  IV azithromycin  added to his current regimen for its antibiotics and anti-inflammatory properties.  ED Course: Temperature 98.  BP 156/69, pulse 102, respiratory 24, O2 saturation 100% on 3 L.  Review of Systems: Review of systems as noted in the HPI. All other systems reviewed and  are negative.   Past Medical History:  Diagnosis Date   Anemia    Aortic atherosclerosis    Aspiration pneumonia due to inhalation of vomitus (HCC) 02/03/2022   Atrial fibrillation (HCC)    Back pain    COPD (chronic obstructive pulmonary disease) (HCC)    COPD exacerbation (HCC) 12/27/2021   COVID-19 12/24/2020   Diabetes mellitus without complication (HCC)    Dizziness 04/08/2022   Elevated PSA    GAD (generalized anxiety disorder)    GERD (gastroesophageal reflux disease)    High cholesterol    Hypertension    Lingular pneumonia 01/05/2021   See cxr 01/06/20 rx with zpak/ cefipime x 5 days and then developed purulent sputum again 01/12/21 so rx levcaquin 500 mg daily x 7 days then return for cxr before more    Lung nodule < 6cm on CT 05/29/2016   Osteoporosis    Oxygen  deficiency    Pneumonia    January 2022   Presence of Watchman left atrial appendage closure device 08/02/2021   s/p LAAO by Dr. Cindie with a 20 mm Watchman FLX device   RSV bronchitis 01/27/2022   Vertebral compression fracture (HCC) 05/29/2019   T8 compression fracture noted on CT scan 05/28/2019 inpatient with chronic steroid dependent COPD - rx calcitonin nasal spray rx per PCP    Past Surgical History:  Procedure Laterality Date   CATARACT EXTRACTION Right    ELBOW SURGERY  07/2022   surgery on olecranon bursa. Dr. Alyse   IR KYPHO EA ADDL LEVEL THORACIC OR LUMBAR  11/28/2021   LEFT ATRIAL APPENDAGE OCCLUSION N/A 08/02/2021   Procedure: LEFT ATRIAL APPENDAGE OCCLUSION;  Surgeon: Wonda Sharper, MD;  Location: [facility] INVASIVE CV LAB;  Service: Cardiovascular;  Laterality: N/A;   SKIN SURGERY     shoulders and chest   TEE WITHOUT CARDIOVERSION N/A 08/02/2021   Procedure: TRANSESOPHAGEAL ECHOCARDIOGRAM (TEE);  Surgeon: Wonda Sharper, MD;  Location: [facility] INVASIVE CV LAB;  Service: Cardiovascular;  Laterality: N/A;   TEE WITHOUT CARDIOVERSION N/A 09/13/2021   Procedure: TRANSESOPHAGEAL ECHOCARDIOGRAM (TEE);   Surgeon: Francyne Headland, MD;  Location: [facility] ENDOSCOPY;  Service: Cardiovascular;  Laterality: N/A;    Social History:  reports that he quit smoking about 15 years ago. His smoking use included cigarettes. He started smoking about 67 years ago. He has a 104 pack-year smoking history. He has never used smokeless tobacco. He reports that he does not currently use alcohol. He reports that he does not use drugs.   Allergies  Allergen Reactions   Tape Other (See Comments)    PLEASE USE COBAN WRAP IN LIEU OF TAPE, as it pulls off the skin!!   Daliresp  [Roflumilast ] Diarrhea and Nausea And Vomiting   Levaquin  [Levofloxacin ] Palpitations and Other (See Comments)    Made the B/P fluctuate and heart raced   Alendronate Anxiety and Other (See Comments)    Jittery/nervous    Family History  Problem Relation Age of Onset   Heart disease Brother    Heart disease Mother    Heart disease Father    Cancer Paternal Uncle       Prior to Admission medications   Medication Sig Start Date End Date Taking? Authorizing Provider  albuterol  (PROVENTIL ) (2.5 MG/3ML) 0.083% nebulizer solution Take 3 mLs (2.5 mg total) by nebulization every 6 (six) hours as needed for shortness of breath or wheezing. Patient taking differently: Take 2.5 mg by nebulization daily. 10/23/22  Yes Parrett, Tammy S, NP  albuterol  (VENTOLIN  HFA) 108 (90 Base) MCG/ACT inhaler Inhale 1-2 puffs into the lungs every 6 (six) hours as needed for wheezing or shortness of breath. 10/15/24  Yes Sirivol, Mamatha, MD  ALPRAZolam  (XANAX ) 0.5 MG tablet Take 1 tablet (0.5 mg total) by mouth 2 (two) times daily as needed for anxiety. F41.1 Patient taking differently: Take 0.5 mg by mouth daily as needed for anxiety. F41.1 09/24/24  Yes Nicholaus Credit, PA-C  Ascorbic Acid  (VITAMIN C ) 1000 MG tablet Take 1,000 mg by mouth daily.   Yes [provider]  aspirin  EC 81 MG tablet Take 1 tablet (81 mg total) by mouth daily. Swallow whole. 01/28/22  Yes  McDaniel, Jill D, NP  atorvastatin  (LIPITOR) 40 MG tablet TAKE 1 TABLET BY MOUTH EVERYDAY AT BEDTIME Patient taking differently: Take 40 mg by mouth at bedtime. 07/22/24  Yes Cox, Kirsten, MD  budesonide -glycopyrrolate -formoterol  (BREZTRI  AEROSPHERE) 160-9-4.8 MCG/ACT AERO inhaler Inhale 2 puffs into the lungs 2 (two) times daily. 07/06/24  Yes Cox, Kirsten, MD  CALCIUM  PO Take 1 tablet by mouth daily.   Yes [provider]  Cholecalciferol  (VITAMIN D3) 50 MCG (2000 UT) TABS Take 2,000 Units by mouth in the morning.   Yes [provider]  dapagliflozin  propanediol (FARXIGA ) 10 MG TABS tablet Take 1 tablet (10 mg total) by mouth daily. 07/06/24  Yes Cox, Kirsten, MD  denosumab  (PROLIA ) 60 MG/ML SOSY injection Inject 60 mg into the skin every 6 (six) months. 03/11/23  Yes Cox, Kirsten, MD  digoxin  (LANOXIN ) 0.125 MG tablet TAKE 1 TABLET BY MOUTH DAILY 04/26/24  Yes Jeffrie Oneil BROCKS, MD  ferrous sulfate  325 (65 FE) MG tablet Take 325 mg by mouth 2 (two) times daily with a meal. Patient taking differently: Take 325 mg by mouth daily with breakfast.   Yes [provider]  furosemide  (LASIX ) 40 MG tablet Take 1 tablet (40 mg total) by mouth daily. 09/12/24  Yes Fausto Sor A, DO  Guaifenesin  (MUCINEX  MAXIMUM STRENGTH) 1200 MG TB12 Take 1,200 mg by mouth daily.   Yes [provider]  ipratropium-albuterol  (DUONEB) 0.5-2.5 (3) MG/3ML SOLN Take 3 mLs by nebulization 2 (two) times daily for 10 days. Patient taking differently: Take 3 mLs by nebulization daily. 09/11/24 10/20/24 Yes Fausto Sor A, DO  levothyroxine  (SYNTHROID ) 25 MCG tablet Take 1 tablet (25 mcg total) by mouth daily. Patient taking differently: Take 25 mcg by mouth daily before breakfast. 05/21/24  Yes Cox, Kirsten, MD  Melatonin 12 MG TABS Take 12 mg by mouth at bedtime.   Yes [provider]  Multiple Vitamin (MULTIVITAMIN WITH MINERALS) TABS tablet Take 1 tablet by mouth daily with breakfast.    Yes [provider]  NUCALA  100 MG/ML SOSY Inject 100 mg into the skin every 30 (thirty) days.   Yes [provider]  Omega-3 Fatty Acids  (FISH OIL) 1200 MG CAPS Take 1,200 mg by mouth in the morning and at bedtime.   Yes [provider]  OXYGEN  Inhale 2-3 L/min into the lungs continuous.   Yes [provider]  pantoprazole  (PROTONIX ) 40 MG tablet Take 1 tablet (40 mg total) by mouth 2 (two) times daily. Patient taking differently: Take 40 mg by mouth 2 (two) times daily before a meal. 05/20/24  Yes Parrett, Tammy S, NP  predniSONE  (DELTASONE ) 10 MG tablet Take by mouth: 4 tablets (40mg  total) daily for 3 days, then 3.5 tablets (35mg  total) daily for 3 days, then 3 tablets (30mg  total) daily for 3 days, then 2.5 tablets (25mg  total) daily for 3 days, then 2 tablets (20mg  total) daily for 3 days, then 1.5 tablets (15mg  total) daily for 3 days,  Then continue taking 1 tablet (10 mg) daily 10/19/24  Yes Drusilla Sabas RAMAN, MD  Probiotic Product (PROBIOTIC DAILY) CAPS Take 1 capsule by mouth in the morning.   Yes [provider]  sertraline  (ZOLOFT ) 50 MG tablet Take 1 tablet (50 mg total) by mouth at bedtime. 10/05/24  Yes Cox, Kirsten, MD  Accu-Chek Softclix Lancets lancets Check fasting sugar once daily. 08/05/23   CoxAbigail, MD  amLODipine  (NORVASC ) 5 MG tablet Take 1 tablet (5 mg total) by mouth daily. Patient not taking: Reported on 10/20/2024 09/12/24   Fausto Sor A, DO  Glucose Blood (BLOOD GLUCOSE TEST STRIPS) STRP Use to test blood sugar up to twice daily. May substitute to any manufacturer covered by patient's insurance. E11.0 07/14/23   [name], MD  Lancets Misc. MISC Use to test blood sugar up to twice daily. May substitute to any manufacturer covered by patient's insurance. E11.0 07/14/23   [name], MD    Physical Exam: BP (!) 162/78 (BP Location: Right Arm)   Pulse 88   Temp 98 F (36.7 C) (Oral)   Resp 20   Ht 5' 5 (1.651  m)   Wt 72 kg   SpO2 95%   BMI 26.41 kg/m   General: 78 y.o. year-old male well developed well nourished in no acute distress.  Alert and oriented x3. Cardiovascular: Regular rate and rhythm with no rubs or gallops.  No thyromegaly or JVD noted.  No lower extremity edema. 2/4 pulses in all 4 extremities. Respiratory: Faint wheezing bilaterally. Good inspiratory effort. Abdomen: Soft nontender nondistended with normal bowel sounds x4 quadrants. Muskuloskeletal: No cyanosis, clubbing or edema noted bilaterally Neuro: CN II-XII intact, strength, sensation, reflexes Skin: No ulcerative lesions noted or rashes Psychiatry: Judgement and insight appear normal. Mood is appropriate for condition and setting          Labs on Admission:  Basic Metabolic Panel: Recent Labs  Lab 10/14/24 2124 10/16/24 0512 10/20/24 0838  NA 139 139 143  K 4.3 4.1 4.2  CL 95* 97* 100  CO2 31 27 33*  GLUCOSE 145* 141* 122*  BUN 22 26* 26*  CREATININE 1.06 0.90 0.89  CALCIUM  9.5 9.1 9.3   Liver Function Tests: No results for input(s): AST, ALT, ALKPHOS, BILITOT, PROT, ALBUMIN  in the last 168 hours. No results for input(s): LIPASE, AMYLASE in the last 168 hours. No results for input(s): AMMONIA in the last 168 hours. CBC: Recent Labs  Lab 10/14/24 2124 10/16/24 0512 10/20/24 0838  WBC 11.4* 12.7* 13.8*  NEUTROABS 9.7*  --   --   HGB 12.8* 12.4* 12.6*  HCT 42.4 41.5 42.8  MCV 78.7* 79.0* 81.4  PLT 284 254 267   Cardiac Enzymes: No results for input(s): CKTOTAL, CKMB, CKMBINDEX, TROPONINI in the last 168 hours.  BNP (last 3 results) No results for input(s): BNP in the last 8760 hours.  ProBNP (last 3 results) Recent Labs    09/27/24 0807 10/04/24 0919 10/14/24 2124  PROBNP 172.0 194 262.0    CBG: Recent Labs  Lab 10/18/24 0813 10/18/24 1142 10/18/24 1621 10/18/24 2208 10/19/24 0853  GLUCAP 136* 93 244* 182* 164*    Radiological Exams on  Admission: DG Chest Port 1 View Result Date: 10/20/2024 EXAM: 1 VIEW(S) XRAY OF THE CHEST 10/20/2024 08:57:47 AM COMPARISON: 10/14/2024 CLINICAL HISTORY: Shortness of breath. Reports SHOB since this morning. Discharged from [place] yesterday for COPD exacerbation. States was sent home on 2L [place] Denies chest pain, dizziness, N/V, fevers RRT in triage FINDINGS: LUNGS AND PLEURA: Lung hyperinflation. No focal pulmonary opacity. No pulmonary edema. No pleural effusion. No pneumothorax. HEART AND MEDIASTINUM: Atherosclerotic calcifications. Left atrial appendage occlusion device noted. No acute abnormality of the cardiac and mediastinal silhouettes. BONES AND SOFT TISSUES: No acute osseous abnormality. IMPRESSION: 1. Lung hyperinflation, consistent with COPD. 2. No acute findings. Electronically signed by: Evalene Coho MD 10/20/2024 09:17 AM EDT RP Workstation: [id number]    EKG: I independently viewed the EKG done and my findings are as followed: Sinus tachycardia rate of 115.  Nonspecific ST-T changes.  QTC 417.  Assessment/Plan Present on Admission:  COPD exacerbation (HCC)  Active Problems:   COPD exacerbation (HCC)  COPD exacerbation with frequent exacerbations 5 presentations to the ER for COPD exacerbation in the past 5 weeks. Patient follows with Dr. Darlean outpatient, next appointment is scheduled for next Thursday, 10/28/2024. Consult pulmonary in the morning. Continue IV Solu-Medrol , bronchodilator nebulizers Add azithromycin  Sputum culture Antitussives as needed Resume home COPD regimen Early mobilization  Hyperlipidemia Resume home Lipitor, fish oil.  GERD Resume home PPI, p.o. Protonix  40 mg twice daily.  Hypothyroidism Resume home levothyroxine .  Chronic anxiety Resume home Xanax  as needed and Zoloft .  OSA Resume CPAP nightly.  Chronic HFpEF Euvolemic Monitor strict I's and O's and daily weight.  Type 2 diabetes, well-controlled Hemoglobin A1c 6.8 on  10/14/2024. Diet controlled. CBG twice daily His diabetes might  become exacerbated by IV Solu-Medrol , closely monitor CBGs and add subcu insulin  if indicated.  Paroxysmal A-fib with watchman device Heart rate is currently well controlled Monitor on telemetry    Time: 75 minutes.    DVT prophylaxis: Subcu Lovenox  daily.  Code Status: Full code.  Family Communication: None at bedside.  Disposition Plan: Admitted to telemetry unit.  Consults called: Please consult pulmonary in the morning.  Admission status: Observation status.   Status is: Observation    Paul LOISE Hurst MD [facility] Pager [phone]  If 7PM-7AM, please contact night-coverage [email or web address] Password [email or web address]  10/21/2024, 1:20 AM

## 2024-10-21 NOTE — Progress Notes (Signed)
 PROGRESS NOTE    Paul [name]  FMW:[id number] DOB: 1946/12/02 DOA: 10/20/2024 PCP: [name], MD   Chief Complaint  Patient presents with   Shortness of Breath    Brief Narrative:   Paul [name] is a 78 y.o. male with medical history significant for COPD with nocturnal hypoxia on 2 L nightly, OSA on CPAP, chronic HFpEF, hypertension, type 2 diabetes, paroxysmal A-fib with Watchman device, hypothyroidism, chronic anxiety, former smoker, quit tobacco use more than 17 years ago, who presents to the ER with complaints of acute shortness of breath that woke him out of his sleep around 4 AM on 10/20/2024.  States his oxygen  saturation was 77% on home 2.5 L oxygen  by nasal cannula.  Also endorses increasing cough with greenish sputum production.  His wife brought him to [facility] ED for further management.    In the ER, tachypneic with O2 saturation of 100% on 3 L nasal cannula.  Chest x-ray with no evidence of pneumonia.  The patient received bronchodilator nebulizers and IV Solu-Medrol  in the ER.  TRH, hospitalist service, was asked to admit.  Accepted by Dr. Claudene and transferred to [facility] telemetry unit as observation status.   At the time of this visit, the patient is on CPAP.  States his breathing has improved after nebulizers treatment.  Has an appointment with his pulmonologist Dr. Darlean next Thursday, 10/28/2024.  The patient would benefit from pulmonary consult while in the hospital in order to address his COPD with frequent exacerbations.  5 presentations to the ER in the the last 5 weeks for COPD exacerbation.  IV azithromycin  added to his current regimen for its antibiotics and anti-inflammatory properties.    Assessment & Plan:   Active Problems:   COPD exacerbation (HCC)  Chronic respiratory failure with hypoxia COPD exacerbation with frequent exacerbations - this is his fourth hospitalization in 5 weeks, another ED visit, which is total 5 presentation to the hospital  for COPD exacerbation . - He is on oxygen  at baseline 2 to 3 L nasal cannula - Monitor input greatly appreciated, continue with current dose steroids and antibiotics. - He was encouraged to use incentive spirometer and flutter valve. - Per pulmonary recommendation change Breztri  to Brovana , Pulmicort  and Yupelri  nebs  - Anxiety as well is a major factor in his recurrent presentation, as well fast eating habits where he aspirate on small pieces of food, he was counseled.   Hyperlipidemia Resume home Lipitor, fish oil.   GERD Resume home PPI, p.o. Protonix  40 mg twice daily.   Hypothyroidism Resume home levothyroxine .   Chronic anxiety Resume home Xanax  as needed and Zoloft .   OSA Resume CPAP nightly.   Chronic HFpEF Euvolemic Monitor strict I's and O's and daily weight.   Type 2 diabetes, well-controlled Hemoglobin A1c 6.8 on 10/14/2024. Diet controlled. CBG twice daily His diabetes might become exacerbated by IV Solu-Medrol , closely monitor CBGs and add subcu insulin  if indicated.   Paroxysmal A-fib with watchman device Heart rate is currently well controlled Monitor on telemetry     DVT prophylaxis: (Lovenox ) Code Status: (Full) Family Communication: (None at bedside) Disposition:   Status is: Observation    Consultants:  Monitoring   Subjective:  Patient reports dyspnea and cough much improved  Objective: Vitals:   10/21/24 0829 10/21/24 0835 10/21/24 1156 10/21/24 1200  BP:    124/73  Pulse:    90  Resp:    (!) 22  Temp:    97.9 F (  36.6 C)  TempSrc:    Oral  SpO2: 98% 99% 97% 99%  Weight:      Height:        Intake/Output Summary (Last 24 hours) at 10/21/2024 1355 Last data filed at 10/21/2024 1004 Gross per 24 hour  Intake 250 ml  Output --  Net 250 ml   Filed Weights   10/21/24 0052  Weight: 72 kg    Examination:  Awake Alert, Oriented X 3, No new F.N deficits, Normal affect Symmetrical Chest wall movement, Good air movement  bilaterally, mild expiratory wheezing RRR,No Gallops,Rubs or new Murmurs, No Parasternal Heave +ve B.Sounds, Abd Soft, No tenderness, No rebound - guarding or rigidity. No Cyanosis, Clubbing or edema, No new Rash or bruise       Data Reviewed: I have personally reviewed following labs and imaging studies  CBC: Recent Labs  Lab 10/14/24 2124 10/16/24 0512 10/20/24 0838 10/21/24 0233  WBC 11.4* 12.7* 13.8* 9.3  NEUTROABS 9.7*  --   --  8.3*  HGB 12.8* 12.4* 12.6* 10.7*  HCT 42.4 41.5 42.8 35.4*  MCV 78.7* 79.0* 81.4 78.7*  PLT 284 254 267 235    Basic Metabolic Panel: Recent Labs  Lab 10/14/24 2124 10/16/24 0512 10/20/24 0838 10/21/24 0233  NA 139 139 143 141  K 4.3 4.1 4.2 4.6  CL 95* 97* 100 97*  CO2 31 27 33* 32  GLUCOSE 145* 141* 122* 115*  BUN 22 26* 26* 21  CREATININE 1.06 0.90 0.89 0.88  CALCIUM  9.5 9.1 9.3 8.4*  MG  --   --   --  2.2  PHOS  --   --   --  3.2    GFR: Estimated Creatinine Clearance: 59.2 mL/min (by C-G formula based on SCr of 0.88 mg/dL).  Liver Function Tests: No results for input(s): AST, ALT, ALKPHOS, BILITOT, PROT, ALBUMIN  in the last 168 hours.  CBG: Recent Labs  Lab 10/18/24 1142 10/18/24 1621 10/18/24 2208 10/19/24 0853 10/21/24 0837  GLUCAP 93 244* 182* 164* 144*     Recent Results (from the past 240 hours)  Resp panel by RT-PCR (RSV, Flu A&B, Covid) Anterior Nasal Swab     Status: None   Collection Time: 10/14/24  9:25 PM   Specimen: Anterior Nasal Swab  Result Value Ref Range Status   SARS Coronavirus 2 by RT PCR NEGATIVE NEGATIVE Final    Comment: (NOTE) SARS-CoV-2 target nucleic acids are NOT DETECTED.  The SARS-CoV-2 RNA is generally detectable in upper respiratory specimens during the acute phase of infection. The lowest concentration of SARS-CoV-2 viral copies this assay can detect is 138 copies/mL. A negative result does not preclude SARS-Cov-2 infection and should not be used as the sole basis  for treatment or other patient management decisions. A negative result may occur with  improper specimen collection/handling, submission of specimen other than nasopharyngeal swab, presence of viral mutation(s) within the areas targeted by this assay, and inadequate number of viral copies(<138 copies/mL). A negative result must be combined with clinical observations, patient history, and epidemiological information. The expected result is Negative.  Fact Sheet for Patients:  bloggercourse.com  Fact Sheet for Healthcare Providers:  seriousbroker.it  This test is no t yet approved or cleared by the [place]  FDA and  has been authorized for detection and/or diagnosis of SARS-CoV-2 by FDA under an Emergency Use Authorization (EUA). This EUA will remain  in effect (meaning this test can be used) for the duration of the COVID-19 declaration  under Section 564(b)(1) of the Act, 21 U.S.C.section 360bbb-3(b)(1), unless the authorization is terminated  or revoked sooner.       Influenza A by PCR NEGATIVE NEGATIVE Final   Influenza B by PCR NEGATIVE NEGATIVE Final    Comment: (NOTE) The Xpert Xpress SARS-CoV-2/FLU/RSV plus assay is intended as an aid in the diagnosis of influenza from Nasopharyngeal swab specimens and should not be used as a sole basis for treatment. Nasal washings and aspirates are unacceptable for Xpert Xpress SARS-CoV-2/FLU/RSV testing.  Fact Sheet for Patients: bloggercourse.com  Fact Sheet for Healthcare Providers: seriousbroker.it  This test is not yet approved or cleared by the [place]  FDA and has been authorized for detection and/or diagnosis of SARS-CoV-2 by FDA under an Emergency Use Authorization (EUA). This EUA will remain in effect (meaning this test can be used) for the duration of the COVID-19 declaration under Section 564(b)(1) of the Act, 21  U.S.C. section 360bbb-3(b)(1), unless the authorization is terminated or revoked.     Resp Syncytial Virus by PCR NEGATIVE NEGATIVE Final    Comment: (NOTE) Fact Sheet for Patients: bloggercourse.com  Fact Sheet for Healthcare Providers: seriousbroker.it  This test is not yet approved or cleared by the [place]  FDA and has been authorized for detection and/or diagnosis of SARS-CoV-2 by FDA under an Emergency Use Authorization (EUA). This EUA will remain in effect (meaning this test can be used) for the duration of the COVID-19 declaration under Section 564(b)(1) of the Act, 21 U.S.C. section 360bbb-3(b)(1), unless the authorization is terminated or revoked.  Performed at [facility], [address] [place], [place] [address]          Radiology Studies: DG Chest [place] 1 View Result Date: 10/20/2024 EXAM: 1 VIEW(S) XRAY OF THE CHEST 10/20/2024 08:57:47 AM COMPARISON: 10/14/2024 CLINICAL HISTORY: Shortness of breath. Reports SHOB since this morning. Discharged from [facility] yesterday for COPD exacerbation. States was sent home on 2L [place] Denies chest pain, dizziness, N/V, fevers RRT in triage FINDINGS: LUNGS AND PLEURA: Lung hyperinflation. No focal pulmonary opacity. No pulmonary edema. No pleural effusion. No pneumothorax. HEART AND MEDIASTINUM: Atherosclerotic calcifications. Left atrial appendage occlusion device noted. No acute abnormality of the cardiac and mediastinal silhouettes. BONES AND SOFT TISSUES: No acute osseous abnormality. IMPRESSION: 1. Lung hyperinflation, consistent with COPD. 2. No acute findings. Electronically signed by: Evalene Coho MD 10/20/2024 09:17 AM EDT RP Workstation: [id number]        Scheduled Meds:  arformoterol   15 mcg Nebulization BID   atorvastatin   40 mg Oral QHS   budesonide  (PULMICORT ) nebulizer solution  0.5 mg Nebulization BID   enoxaparin  (LOVENOX ) injection   40 mg Subcutaneous Q24H   levothyroxine   25 mcg Oral Q0600   methylPREDNISolone  (SOLU-MEDROL ) injection  125 mg Intravenous Daily   omega-3 acid ethyl esters  1 g Oral Daily   pantoprazole   40 mg Oral BID AC   revefenacin   175 mcg Nebulization Daily   sertraline   50 mg Oral QHS   Continuous Infusions:  azithromycin  Stopped (10/21/24 0251)     LOS: 0 days       Brayton Lye, MD [facility]   To contact the attending provider between 7A-7P or the covering provider during after hours 7P-7A, please log into the web site [email or web address] and access using universal [place] password for that web site. If you do not have the password, please call the hospital operator.  10/21/2024, 1:55 PM

## 2024-10-21 NOTE — Consult Note (Signed)
 NAME:  Paul [name], MRN:  [id number], DOB:  06/29/1946, LOS: 0 ADMISSION DATE:  10/20/2024, CONSULTATION DATE: 10/22/2023 REFERRING MD: JONETTA Lye MD  , CHIEF COMPLAINT:   Dyspnea, wheezing  History of Present Illness:   The patient, with severe COPD and sleep apnea, presents with difficulty breathing and a COPD exacerbation.  Dyspnea and respiratory status - Severe, persistent difficulty breathing, recently worsening to the point of inability to talk or drink - Woke up at 4 AM with shortness of breath - Requires home oxygen : 2 L with CPAP, 3 L when active, with increased usage since mid-September - Experiences anxiety when unable to breathe, uses Xanax  for relief  Copd exacerbation - Current exacerbation possibly triggered by aspiration event while eating dinner - Coughed for approximately two hours before expelling a piece of corn - Yellow and green sputum production - History of wheezing, particularly severe during recent ER visit - Uses nebulizers and emergency inhalers at home, sometimes without sufficient relief  Hospitalizations and treatment history - Hospitalized four times since mid-September - This hospitalization occurred less than 24 hours after previous discharge  Chronic respiratory therapy - Uses CPAP machine, supplemental oxygen  - Uses Breztri  and chronic prednisone  at 10 mg/day - Started Nucala  three months ago  Pertinent  Medical History    has a past medical history of Anemia, Aortic atherosclerosis, Aspiration pneumonia due to inhalation of vomitus (HCC) (02/03/2022), Atrial fibrillation (HCC), Back pain, COPD (chronic obstructive pulmonary disease) (HCC), COPD exacerbation (HCC) (12/27/2021), COVID-19 (12/24/2020), Diabetes mellitus without complication (HCC), Dizziness (04/08/2022), Elevated PSA, GAD (generalized anxiety disorder), GERD (gastroesophageal reflux disease), High cholesterol, Hypertension, Lingular pneumonia (01/05/2021), Lung nodule < 6cm on  CT (05/29/2016), Osteoporosis, Oxygen  deficiency, Pneumonia, 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   Significant Hospital Events: Including procedures, antibiotic start and stop dates in addition to other pertinent events     Interim History / Subjective:    Objective    Blood pressure 124/73, pulse 90, temperature 97.9 F (36.6 C), temperature source Oral, resp. rate (!) 22, height 5' 5 (1.651 m), weight 72 kg, SpO2 99%.    FiO2 (%):  [32 %] 32 %   Intake/Output Summary (Last 24 hours) at 10/21/2024 1308 Last data filed at 10/21/2024 1004 Gross per 24 hour  Intake 250 ml  Output --  Net 250 ml   Filed Weights   10/21/24 0052  Weight: 72 kg    Examination: Gen:      No acute distress HEENT:  EOMI, sclera anicteric Neck:     No masses; no thyromegaly Lungs:    Mild expiratory wheeze.  No crackles CV:         Regular rate and rhythm; no murmurs Abd:      + bowel sounds; soft, non-tender; no palpable masses, no distension Ext:    No edema; adequate peripheral perfusion Neuro: alert and oriented x 3 Psych: normal mood and affect   Lab/imaging reviewed Significant for negative COVID, flu, RSV Chest x-ray with hyperinflated lungs.  No infiltrate.  Resolved problem list   Assessment and Plan  COPD with acute exacerbation Severe COPD with acute exacerbation, with four hospitalizations since mid-September. Experiencing shortness of breath and wheezing. Recent aspiration event may have triggered the exacerbation. On Nucala  for three months, which takes up to six months for full effect. Currently on Breztri , but nebulizers may be more effective due to difficulty inhaling Breztri  completely. -  Check RVP for other viral infections - Continue Zithromax  and Solu-Medrol  at current dose - Change Breztri  to Brovana , Pulmicort  and Yupelri  nebs  - Ensure oxygen  therapy  at 3 L/min during activity and 2 L/min during rest  Aspiration event (resolved) Recent aspiration event involving a piece of corn, resolved after coughing. This event may have contributed to the current COPD exacerbation. - Advise careful eating habits to prevent future aspiration events  Obstructive sleep apnea Obstructive sleep apnea managed with CPAP  Anxiety symptoms Anxiety symptoms exacerbated by difficulty breathing. Xanax  helps to calm anxiety during episodes of shortness of breath. - Continue Xanax  as needed for anxiety  Signature:   Virlan Kempker MD [facility] See Amion for pager  If no response to pager , please call [phone] until 7pm After 7:00 pm call Elink  [phone] 10/21/2024, 1:49 PM

## 2024-10-21 NOTE — Evaluation (Signed)
 RT Evaluate and Treat Note  10/21/2024   Breathing is (select one): Same as normal - pt states his breathing is improving now.   The following was found on auscultation (select multiple):  Bilateral Breath Sounds: Diminished;Clear (10/21/24 1156)  R Upper  Breath Sounds: Diminished;Clear (10/21/24 1156) L Upper Breath Sounds: Diminished;Clear (10/21/24 1156) R Lower Breath Sounds: Diminished;Clear (10/21/24 1156) L Lower Breath Sounds: Diminished;Clear (10/21/24 1156)    Cough Assessment: Cough: Non-productive (10/21/24 0120)    Most Recent Chest Xray:... See results on 10/29   The following medications and/or interventions were ordered/changed/discontinued as part of the Respiratory Treatment protocol:   Medication Changes: Duoneb changed to q6   Airway Clearance Changes: continue current care   Oxygen  Therapy Changes:[place] was weaned to 2 lpm Felt earlier today.    Per pt, he states he does not feel he needs nebs q4, nebs weaned to q6.  Currently no wheezes noted, pt states he feels his breathing is improving.

## 2024-10-22 DIAGNOSIS — J441 Chronic obstructive pulmonary disease with (acute) exacerbation: Secondary | ICD-10-CM | POA: Diagnosis not present

## 2024-10-22 LAB — BASIC METABOLIC PANEL WITH GFR
Anion gap: 13 (ref 5–15)
BUN: 20 mg/dL (ref 8–23)
CO2: 27 mmol/L (ref 22–32)
Calcium: 8.3 mg/dL — ABNORMAL LOW (ref 8.9–10.3)
Chloride: 98 mmol/L (ref 98–111)
Creatinine, Ser: 1.01 mg/dL (ref 0.61–1.24)
GFR, Estimated: >60 mL/min (ref >60)
Glucose, Bld: 132 mg/dL — ABNORMAL HIGH (ref 70–99)
Potassium: 4.2 mmol/L (ref 3.5–5.1)
Sodium: 138 mmol/L (ref 135–145)

## 2024-10-22 LAB — CBC
HCT: 34.5 % — ABNORMAL LOW (ref 39.0–52.0)
Hemoglobin: 10.4 g/dL — ABNORMAL LOW (ref 13.0–17.0)
MCH: 23.9 pg — ABNORMAL LOW (ref 26.0–34.0)
MCHC: 30.1 g/dL (ref 30.0–36.0)
MCV: 79.1 fL — ABNORMAL LOW (ref 80.0–100.0)
Platelets: 202 K/uL (ref 150–400)
RBC: 4.36 MIL/uL (ref 4.22–5.81)
RDW: 17.4 % — ABNORMAL HIGH (ref 11.5–15.5)
WBC: 10.7 K/uL — ABNORMAL HIGH (ref 4.0–10.5)
nRBC: 0 % (ref 0.0–0.2)

## 2024-10-22 MED ORDER — METHYLPREDNISOLONE SODIUM SUCC 40 MG IJ SOLR
40.0000 mg | Freq: Two times a day (BID) | INTRAMUSCULAR | Status: DC
  Administered 2024-10-23 – 2024-10-24 (×3): 40 mg via INTRAVENOUS
  Filled 2024-10-22 (×3): qty 1

## 2024-10-22 NOTE — Progress Notes (Signed)
 PROGRESS NOTE    Paul [name]  FMW:[id number] DOB: July 31, 1946 DOA: 10/20/2024 PCP: [name], MD   Chief Complaint  Patient presents with   Shortness of Breath    Brief Narrative:   Paul [name] is a 78 y.o. male with medical history significant for COPD with nocturnal hypoxia on 2 L nightly, OSA on CPAP, chronic HFpEF, hypertension, type 2 diabetes, paroxysmal A-fib with Watchman device, hypothyroidism, chronic anxiety, former smoker, quit tobacco use more than 17 years ago, who presents to the ER with complaints of acute shortness of breath that woke him out of his sleep around 4 AM on 10/20/2024.  States his oxygen  saturation was 77% on home 2.5 L oxygen  by nasal cannula.  Also endorses increasing cough with greenish sputum production.  His wife brought him to [facility] ED for further management.    In the ER, tachypneic with O2 saturation of 100% on 3 L nasal cannula.  Chest x-ray with no evidence of pneumonia.  The patient received bronchodilator nebulizers and IV Solu-Medrol  in the ER.  [facility], hospitalist service, was asked to admit.  Accepted by Dr. Claudene and transferred to [facility] telemetry unit as observation status.   At the time of this visit, the patient is on CPAP.  States his breathing has improved after nebulizers treatment.  Has an appointment with his pulmonologist Dr. Darlean next Thursday, 10/28/2024.  The patient would benefit from pulmonary consult while in the hospital in order to address his COPD with frequent exacerbations.  5 presentations to the ER in the the last 5 weeks for COPD exacerbation.  IV azithromycin  added to his current regimen for its antibiotics and anti-inflammatory properties.    Assessment & Plan:   Active Problems:   COPD exacerbation (HCC)  Chronic respiratory failure with hypoxia COPD exacerbation with frequent exacerbations - this is his fourth hospitalization in 5 weeks, another ED visit, which is total 5 presentation to the hospital  for COPD exacerbation . - He is on oxygen  at baseline 2 to 3 L nasal cannula - Monitor input greatly appreciated, continue with current dose steroids and antibiotics. -Continue with IV steroids, but decrease dose to 40 mg IV twice daily. - He was encouraged to use incentive spirometer and flutter valve. - Per pulmonary recommendation change Breztri  to Brovana , Pulmicort  and Yupelri  nebs  -Negative respiratory panel - Anxiety as well is a major factor in his recurrent presentation, as well fast eating habits where he aspirate on small pieces of food, he was counseled.   Hyperlipidemia Resume home Lipitor, fish oil.   GERD Resume home PPI, p.o. Protonix  40 mg twice daily.   Hypothyroidism Resume home levothyroxine .   Chronic anxiety Resume home Xanax  as needed and Zoloft .   OSA Resume CPAP nightly.   Chronic HFpEF Euvolemic Monitor strict I's and O's and daily weight.   Type 2 diabetes, well-controlled Hemoglobin A1c 6.8 on 10/14/2024. Diet controlled. CBG twice daily His diabetes might become exacerbated by IV Solu-Medrol , closely monitor CBGs and add subcu insulin  if indicated.   Paroxysmal A-fib with watchman device Heart rate is currently well controlled Monitor on telemetry     DVT prophylaxis: (Lovenox ) Code Status: (Full) Family Communication: (None at bedside) Disposition:   Status is: Observation    Consultants:  Monitoring   Subjective:  Orts he is feeling better today, dyspnea and cough has improved Objective: Vitals:   10/22/24 0442 10/22/24 0500 10/22/24 0807 10/22/24 1211  BP: (!) 141/62   125/61  Pulse: 79     Resp:   18   Temp: 97.8 F (36.6 C)   98.5 F (36.9 C)  TempSrc: Oral   Oral  SpO2: 100%     Weight:  70.1 kg    Height:        Intake/Output Summary (Last 24 hours) at 10/22/2024 1449 Last data filed at 10/22/2024 1018 Gross per 24 hour  Intake --  Output 830 ml  Net -830 ml   Filed Weights   10/21/24 0052 10/22/24 0500   Weight: 72 kg 70.1 kg    Examination:  Awake Alert, Oriented X 3, No new F.N deficits, Normal affect Symmetrical Chest wall movement, decreased air entry today with no wheezing RRR,No Gallops,Rubs or new Murmurs, No Parasternal Heave +ve B.Sounds, Abd Soft, No tenderness, No rebound - guarding or rigidity. No Cyanosis, Clubbing or edema, No new Rash or bruise        Data Reviewed: I have personally reviewed following labs and imaging studies  CBC: Recent Labs  Lab 10/16/24 0512 10/20/24 0838 10/21/24 0233 10/22/24 0331  WBC 12.7* 13.8* 9.3 10.7*  NEUTROABS  --   --  8.3*  --   HGB 12.4* 12.6* 10.7* 10.4*  HCT 41.5 42.8 35.4* 34.5*  MCV 79.0* 81.4 78.7* 79.1*  PLT 254 267 235 202    Basic Metabolic Panel: Recent Labs  Lab 10/16/24 0512 10/20/24 0838 10/21/24 0233 10/22/24 0331  NA 139 143 141 138  K 4.1 4.2 4.6 4.2  CL 97* 100 97* 98  CO2 27 33* 32 27  GLUCOSE 141* 122* 115* 132*  BUN 26* 26* 21 20  CREATININE 0.90 0.89 0.88 1.01  CALCIUM  9.1 9.3 8.4* 8.3*  MG  --   --  2.2  --   PHOS  --   --  3.2  --     GFR: Estimated Creatinine Clearance: 51.6 mL/min (by C-G formula based on SCr of 1.01 mg/dL).  Liver Function Tests: No results for input(s): AST, ALT, ALKPHOS, BILITOT, PROT, ALBUMIN  in the last 168 hours.  CBG: Recent Labs  Lab 10/18/24 1621 10/18/24 2208 10/19/24 0853 10/21/24 0837 10/21/24 1756  GLUCAP 244* 182* 164* 144* 160*     Recent Results (from the past 240 hours)  Resp panel by RT-PCR (RSV, Flu A&B, Covid) Anterior Nasal Swab     Status: None   Collection Time: 10/14/24  9:25 PM   Specimen: Anterior Nasal Swab  Result Value Ref Range Status   SARS Coronavirus 2 by RT PCR NEGATIVE NEGATIVE Final    Comment: (NOTE) SARS-CoV-2 target nucleic acids are NOT DETECTED.  The SARS-CoV-2 RNA is generally detectable in upper respiratory specimens during the acute phase of infection. The lowest concentration of SARS-CoV-2  viral copies this assay can detect is 138 copies/mL. A negative result does not preclude SARS-Cov-2 infection and should not be used as the sole basis for treatment or other patient management decisions. A negative result may occur with  improper specimen collection/handling, submission of specimen other than nasopharyngeal swab, presence of viral mutation(s) within the areas targeted by this assay, and inadequate number of viral copies(<138 copies/mL). A negative result must be combined with clinical observations, patient history, and epidemiological information. The expected result is Negative.  Fact Sheet for Patients:  bloggercourse.com  Fact Sheet for Healthcare Providers:  seriousbroker.it  This test is no t yet approved or cleared by the [place]  FDA and  has been authorized for detection and/or diagnosis of  SARS-CoV-2 by FDA under an Emergency Use Authorization (EUA). This EUA will remain  in effect (meaning this test can be used) for the duration of the COVID-19 declaration under Section 564(b)(1) of the Act, 21 U.S.C.section 360bbb-3(b)(1), unless the authorization is terminated  or revoked sooner.       Influenza A by PCR NEGATIVE NEGATIVE Final   Influenza B by PCR NEGATIVE NEGATIVE Final    Comment: (NOTE) The Xpert Xpress SARS-CoV-2/FLU/RSV plus assay is intended as an aid in the diagnosis of influenza from Nasopharyngeal swab specimens and should not be used as a sole basis for treatment. Nasal washings and aspirates are unacceptable for Xpert Xpress SARS-CoV-2/FLU/RSV testing.  Fact Sheet for Patients: bloggercourse.com  Fact Sheet for Healthcare Providers: seriousbroker.it  This test is not yet approved or cleared by the [place]  FDA and has been authorized for detection and/or diagnosis of SARS-CoV-2 by FDA under an Emergency Use Authorization  (EUA). This EUA will remain in effect (meaning this test can be used) for the duration of the COVID-19 declaration under Section 564(b)(1) of the Act, 21 U.S.C. section 360bbb-3(b)(1), unless the authorization is terminated or revoked.     Resp Syncytial Virus by PCR NEGATIVE NEGATIVE Final    Comment: (NOTE) Fact Sheet for Patients: bloggercourse.com  Fact Sheet for Healthcare Providers: seriousbroker.it  This test is not yet approved or cleared by the [place]  FDA and has been authorized for detection and/or diagnosis of SARS-CoV-2 by FDA under an Emergency Use Authorization (EUA). This EUA will remain in effect (meaning this test can be used) for the duration of the COVID-19 declaration under Section 564(b)(1) of the Act, 21 U.S.C. section 360bbb-3(b)(1), unless the authorization is terminated or revoked.  Performed at [facility], [address] [place], [place] [address]   Respiratory (~20 pathogens) panel by PCR     Status: None   Collection Time: 10/21/24  1:52 PM   Specimen: Nasopharyngeal Swab; Respiratory  Result Value Ref Range Status   Adenovirus NOT DETECTED NOT DETECTED Final   Coronavirus 229E NOT DETECTED NOT DETECTED Final    Comment: (NOTE) The Coronavirus on the Respiratory Panel, DOES NOT test for the novel  Coronavirus (2019 nCoV)    Coronavirus HKU1 NOT DETECTED NOT DETECTED Final   Coronavirus NL63 NOT DETECTED NOT DETECTED Final   Coronavirus OC43 NOT DETECTED NOT DETECTED Final   Metapneumovirus NOT DETECTED NOT DETECTED Final   Rhinovirus / Enterovirus NOT DETECTED NOT DETECTED Final   Influenza A NOT DETECTED NOT DETECTED Final   Influenza B NOT DETECTED NOT DETECTED Final   Parainfluenza Virus 1 NOT DETECTED NOT DETECTED Final   Parainfluenza Virus 2 NOT DETECTED NOT DETECTED Final   Parainfluenza Virus 3 NOT DETECTED NOT DETECTED Final   Parainfluenza Virus 4 NOT DETECTED NOT  DETECTED Final   Respiratory Syncytial Virus NOT DETECTED NOT DETECTED Final   Bordetella pertussis NOT DETECTED NOT DETECTED Final   Bordetella Parapertussis NOT DETECTED NOT DETECTED Final   Chlamydophila pneumoniae NOT DETECTED NOT DETECTED Final   Mycoplasma pneumoniae NOT DETECTED NOT DETECTED Final    Comment: Performed at [facility], [address] [place], [place] [address]         Radiology Studies: No results found.       Scheduled Meds:  arformoterol   15 mcg Nebulization BID   atorvastatin   40 mg Oral QHS   budesonide  (PULMICORT ) nebulizer solution  0.5 mg Nebulization BID   enoxaparin  (  LOVENOX ) injection  40 mg Subcutaneous Q24H   levothyroxine   25 mcg Oral Q0600   methylPREDNISolone  (SOLU-MEDROL ) injection  125 mg Intravenous Daily   omega-3 acid ethyl esters  1 g Oral Daily   pantoprazole   40 mg Oral BID AC   revefenacin   175 mcg Nebulization Daily   sertraline   50 mg Oral QHS   Continuous Infusions:  azithromycin  500 mg (10/22/24 0258)     LOS: 1 day       Brayton Lye, MD [facility]   To contact the attending provider between 7A-7P or the covering provider during after hours 7P-7A, please log into the web site [email or web address] and access using universal [place] password for that web site. If you do not have the password, please call the hospital operator.  10/22/2024, 2:49 PM

## 2024-10-22 NOTE — Plan of Care (Signed)
   Problem: Education: Goal: Knowledge of General Education information will improve Description Including pain rating scale, medication(s)/side effects and non-pharmacologic comfort measures Outcome: Progressing   Problem: Health Behavior/Discharge Planning: Goal: Ability to manage health-related needs will improve Outcome: Progressing

## 2024-10-22 NOTE — Evaluation (Signed)
 Physical Therapy Brief Evaluation and Discharge Note Patient Details Name: Paul [name] MRN: [id number] DOB: 23-Dec-1946 Today's Date: 10/22/2024   History of Present Illness  Paul [name] is a 78 y.o. male who presents to the ER with complaints of acute shortness of breath that woke him out of his sleep around 4 AM on 10/20/2024.  States his oxygen  saturation was 77% on home 2.5 L oxygen  by nasal cannula. Past medical history significant for COPD with nocturnal hypoxia on 2 L nightly, OSA on CPAP, chronic HFpEF, hypertension, type 2 diabetes, paroxysmal A-fib with Watchman device, hypothyroidism, chronic anxiety, former smoker, quit tobacco use more than 17 years ago.  Clinical Impression  Pt presents with admitting diagnosis above. Pt is familiar to this PT as I evaluated him a few days ago. Pt today was able to ambulate in hallway independently and perform DGI. Pt scored 24/24 on DGI indicating that he is not a fall risk. Pt presents at or near baseline mobility. Pt has no further acute PT needs and will be signing off. Pt may benefit from OP pulmonary rehab upon DC. Re consult PT if mobility status changes. Pt would benefit from continued mobility with mobility specialist during acute stay.        PT Assessment Patient does not need any further PT services  Assistance Needed at Discharge  PRN    Equipment Recommendations None recommended by PT  Recommendations for Other Services       Precautions/Restrictions Precautions Precautions: Fall Recall of Precautions/Restrictions: Intact Restrictions Weight Bearing Restrictions Per Provider Order: No        Mobility  Bed Mobility       General bed mobility comments: Up in recliner  Transfers Overall transfer level: Independent Equipment used: None                    Ambulation/Gait Ambulation/Gait assistance: Independent Gait Distance (Feet): 600 Feet Assistive device: None Gait Pattern/deviations: WFL(Within  Functional Limits) Gait Speed: Pace WFL General Gait Details: 1 standing rest break. Able to perform DGI. SpO2 between 89%-93% on 2-3L.  Home Activity Instructions    Stairs            Modified Rankin (Stroke Patients Only)        Balance Overall balance assessment: No apparent balance deficits (not formally assessed)               Standardized Balance Assessment Standardized Balance Assessment : Dynamic Gait Index Dynamic Gait Index Level Surface: Normal Change in Gait Speed: Normal Gait with Horizontal Head Turns: Normal Gait with Vertical Head Turns: Normal Gait and Pivot Turn: Normal Step Over Obstacle: Normal Step Around Obstacles: Normal Steps: Normal Total Score: 24      Pertinent Vitals/Pain PT - Brief Vital Signs All Vital Signs Stable: Yes Pain Assessment Pain Assessment: No/denies pain     Home Living Family/patient expects to be discharged to:: Private residence Living Arrangements: Spouse/significant other Available Help at Discharge: Family;Available 24 hours/day Home Environment: Level entry   Home Equipment: Rollator (4 wheels);Shower seat;Grab bars - tub/shower;Cane - single point   Additional Comments: On home O2 when sleeping or with exercise    Prior Function Level of Independence: Independent      UE/LE Assessment   UE ROM/Strength/Tone/Coordination: WFL    LE ROM/Strength/Tone/Coordination: [facility]      Communication   Communication Communication: No apparent difficulties     Cognition Overall Cognitive Status: Appears within functional limits for tasks assessed/performed  General Comments General comments (skin integrity, edema, etc.): SpO2 between 89%-93% on 2-3L. Cues for PLB.    Exercises     Assessment/Plan    PT Problem List         PT Visit Diagnosis Other abnormalities of gait and mobility (R26.89)    No Skilled PT Patient at baseline level of functioning   Co-evaluation                 AMPAC 6 Clicks Help needed turning from your back to your side while in a flat bed without using bedrails?: None Help needed moving from lying on your back to sitting on the side of a flat bed without using bedrails?: None Help needed moving to and from a bed to a chair (including a wheelchair)?: None Help needed standing up from a chair using your arms (e.g., wheelchair or bedside chair)?: None Help needed to walk in hospital room?: A Little Help needed climbing 3-5 steps with a railing? : A Little 6 Click Score: 22      End of Session Equipment Utilized During Treatment: Gait belt;Oxygen  Activity Tolerance: Patient tolerated treatment well Patient left: in chair;with call bell/phone within reach Nurse Communication: Mobility status PT Visit Diagnosis: Other abnormalities of gait and mobility (R26.89)     Time: 9051-8995 PT Time Calculation (min) (ACUTE ONLY): 16 min  Charges:   PT Evaluation $PT Eval Low Complexity: 1 Low      [name], PT, DPT Acute Rehab Services [phone]   Valia Wingard  10/22/2024, 10:14 AM

## 2024-10-22 NOTE — Progress Notes (Signed)
 NAME:  Paul [name], MRN:  [id number], DOB:  [birth year], LOS: 1 ADMISSION DATE:  10/20/2024, CONSULTATION DATE: 10/22/2023 REFERRING MD: JONETTA Lye MD  , CHIEF COMPLAINT:   Dyspnea, wheezing  History of Present Illness:   The patient, with severe COPD and sleep apnea, presents with difficulty breathing and a COPD exacerbation.  Dyspnea and respiratory status - Severe, persistent difficulty breathing, recently worsening to the point of inability to talk or drink - Woke up at 4 AM with shortness of breath - Requires home oxygen : 2 L with CPAP, 3 L when active, with increased usage since mid-September - Experiences anxiety when unable to breathe, uses Xanax  for relief  Copd exacerbation - Current exacerbation possibly triggered by aspiration event while eating dinner - Coughed for approximately two hours before expelling a piece of corn - Yellow and green sputum production - History of wheezing, particularly severe during recent ER visit - Uses nebulizers and emergency inhalers at home, sometimes without sufficient relief  Hospitalizations and treatment history - Hospitalized four times since mid-September - This hospitalization occurred less than 24 hours after previous discharge  Chronic respiratory therapy - Uses CPAP machine, supplemental oxygen  - Uses Breztri  and chronic prednisone  at 10 mg/day - Started Nucala  three months ago  Pertinent  Medical History    has a past medical history of Anemia, Aortic atherosclerosis, Aspiration pneumonia due to inhalation of vomitus (HCC) (02/03/2022), Atrial fibrillation (HCC), Back pain, COPD (chronic obstructive pulmonary disease) (HCC), COPD exacerbation (HCC) (12/27/2021), COVID-19 (12/24/2020), Diabetes mellitus without complication (HCC), Dizziness (04/08/2022), Elevated PSA, GAD (generalized anxiety disorder), GERD (gastroesophageal reflux disease), High cholesterol, Hypertension, Lingular pneumonia (01/05/2021), Lung nodule < 6cm on  CT (05/29/2016), Osteoporosis, Oxygen  deficiency, Pneumonia, 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   Significant Hospital Events: Including procedures, antibiotic start and stop dates in addition to other pertinent events   10/29 admit  Interim History / Subjective:   Feeling better.  Sitting in chair at bedside.  Objective    Blood pressure (!) 141/62, pulse 79, temperature 97.8 F (36.6 C), temperature source Oral, resp. rate 18, height 5' 5 (1.651 m), weight 70.1 kg, SpO2 100%.        Intake/Output Summary (Last 24 hours) at 10/22/2024 0946 Last data filed at 10/22/2024 0300 Gross per 24 hour  Intake 250 ml  Output 530 ml  Net -280 ml   Filed Weights   10/21/24 0052 10/22/24 0500  Weight: 72 kg 70.1 kg    Examination: Gen:      No acute distress HEENT:  EOMI, sclera anicteric Neck:     No masses; no thyromegaly Lungs:   Diminished air entry, no wheezing CV:         Regular rate and rhythm; no murmurs Abd:      + bowel sounds; soft, non-tender; no palpable masses, no distension Ext:    No edema; adequate peripheral perfusion Neuro: alert and oriented x 3 Psych: normal mood and affect   Lab/imaging reviewed Significant for negative COVID, flu, RSV Respiratory virus panel negative  Resolved problem list   Assessment and Plan  COPD with acute exacerbation Severe COPD with acute exacerbation, with four hospitalizations since mid-September. Experiencing shortness of breath and wheezing. Recent aspiration event may have triggered the exacerbation. On Nucala  for three months, which takes up to six months for full effect. Currently on Breztri , but nebulizers may be more effective due to difficulty inhaling  Breztri  completely. - Continue Zithromax  - Reduce Solu-Medrol  dose to 40 mg every 12 - Continue Brovana , Pulmicort  and Yupelri  nebs  - Ensure oxygen   therapy at 3 L/min during activity and 2 L/min during rest  Aspiration event (resolved) Recent aspiration event involving a piece of corn, resolved after coughing. This event may have contributed to the current COPD exacerbation. - Advise careful eating habits to prevent future aspiration events  Obstructive sleep apnea Obstructive sleep apnea managed with CPAP  Anxiety symptoms Anxiety symptoms exacerbated by difficulty breathing. Xanax  helps to calm anxiety during episodes of shortness of breath. - Continue Xanax  as needed for anxiety  Signature:   Timmy Cleverly MD [facility] See Amion for pager  If no response to pager , please call [phone] until 7pm After 7:00 pm call Elink  [phone] 10/22/2024, 9:46 AM

## 2024-10-23 DIAGNOSIS — J441 Chronic obstructive pulmonary disease with (acute) exacerbation: Secondary | ICD-10-CM | POA: Diagnosis not present

## 2024-10-23 MED ORDER — AZITHROMYCIN 500 MG PO TABS
500.0000 mg | ORAL_TABLET | Freq: Every day | ORAL | Status: DC
  Administered 2024-10-24: 500 mg via ORAL
  Filled 2024-10-23: qty 1

## 2024-10-23 NOTE — Plan of Care (Signed)
   Problem: Education: Goal: Knowledge of General Education information will improve Description: Including pain rating scale, medication(s)/side effects and non-pharmacologic comfort measures Outcome: Progressing   Problem: Health Behavior/Discharge Planning: Goal: Ability to manage health-related needs will improve Outcome: Progressing   Problem: Activity: Goal: Risk for activity intolerance will decrease Outcome: Progressing

## 2024-10-23 NOTE — Plan of Care (Signed)

## 2024-10-23 NOTE — TOC Initial Note (Signed)
 Transition of Care [facility]) - Initial/Assessment Note    Patient Details  Name: Paul [name] MRN: [id number] Date of Birth: 02/17/1946  Transition of Care [facility]) CM/SW Contact:    Paul Gell, RN Phone Number: 10/23/2024, 9:03 AM  Clinical Narrative:                  Paul [name] w patient at bedside.  He is from home w wife, will have support on DC. He has needed DME at home, RW, cane, shower chair, O2 with [employer] and CPAP. No needs identified for DC at this time.  Expected Discharge Plan: Home/Self Care Barriers to Discharge: Continued Medical Work up   Patient Goals and CMS Choice Patient states their goals for this hospitalization and ongoing recovery are:: to return home          Expected Discharge Plan and Services   Discharge Planning Services: CM Consult   Living arrangements for the past 2 months: Single Family Home                 DME Arranged: N/A           HH Agency: NA        Prior Living Arrangements/Services Living arrangements for the past 2 months: Single Family Home Lives with:: Spouse   Do you feel safe going back to the place where you live?: Yes          Current home services: DME    Activities of Daily Living   ADL Screening (condition at time of admission) Independently performs ADLs?: Yes (appropriate for developmental age) Is the patient deaf or have difficulty hearing?: No Does the patient have difficulty seeing, even when wearing glasses/contacts?: No Does the patient have difficulty concentrating, remembering, or making decisions?: No  Permission Sought/Granted                  Emotional Assessment              Admission diagnosis:  COPD exacerbation (HCC) [J44.1] Acute on chronic respiratory failure with hypoxia (HCC) [J96.21] Patient Active Problem List   Diagnosis Date Noted   Acute on chronic respiratory failure with hypoxia (HCC) 10/15/2024   Severe persistent asthma with acute exacerbation (HCC) 10/14/2024    COPD exacerbation (HCC) 09/27/2024   Hypothyroidism 09/27/2024   Immunization due 09/16/2024   COPD (chronic obstructive pulmonary disease) (HCC) 09/02/2024   COPD with acute exacerbation (HCC) 08/30/2024   Presence of heart assist device (HCC) 07/08/2024   Abnormal TSH 04/05/2024   Encounter for Medicare annual wellness exam 04/04/2024   Syncope and collapse 08/29/2023   Laceration of left thigh 08/29/2023   Age-related osteoporosis without current pathological fracture 07/26/2023   Secondary hypercoagulable state 04/02/2023   OSA (obstructive sleep apnea) 03/26/2023   Hypoxia 02/04/2023   Elevated d-dimer 01/25/2023   Iron 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 08/16/2022   Olecranon bursitis of right elbow 05/16/2022   Syncope 04/08/2022   GERD (gastroesophageal reflux disease) / GI protection on antiplatelet therapy 12/27/2021   Lumbar back pain 11/17/2021   Oxygen  dependent 10/09/2021   Paroxysmal atrial fibrillation (HCC) 08/02/2021   Chronic respiratory failure with hypoxia (HCC) 01/16/2021   GAD (generalized anxiety disorder) 01/14/2021   Chronic diastolic CHF (congestive heart failure) (HCC) 04/01/2020   Age-related osteoporosis with healing pathological fracture, chronic back pain 02/06/2020   Essential hypertension 01/28/2020   Chronic bilateral thoracic back pain 01/25/2020   DM type 2 with diabetic  mixed hyperlipidemia (HCC) 01/25/2020   Mixed hyperlipidemia 01/05/2020   Absolute anemia 05/29/2019   Pulmonary nodules 05/29/2019   Coronary artery calcification 04/06/2019   Atherosclerosis of aorta 04/06/2019   Acute on chronic respiratory failure with hypoxia and hypercapnia (HCC)    Former cigarette smoker 12/05/2015   COPD GOLD  III     PCP:  Paul Clapper, MD Pharmacy:   [facility] [phone] - [place], [place] - [address] [place] [address] Phone: [phone] Fax: [phone]  [name] [address] [place] [address] Phone: [phone] Fax: [phone]     Social Drivers of Health (SDOH) Social History: SDOH Screenings   Food Insecurity: No Food Insecurity (10/21/2024)  Housing: Low Risk  (10/21/2024)  Transportation Needs: No Transportation Needs (10/21/2024)  Utilities: Not At Risk (10/21/2024)  Alcohol Screen: Low Risk  (07/26/2024)  Depression (PHQ2-9): Low Risk  (07/26/2024)  Financial Resource Strain: Low Risk  (07/19/2023)  Physical Activity: Insufficiently Active (07/19/2023)  Social Connections: Socially Integrated (10/21/2024)  Stress: No Stress Concern Present (07/19/2023)  Tobacco Use: Medium Risk (10/20/2024)  Health Literacy: Adequate Health Literacy (07/23/2023)   SDOH Interventions:     Readmission Risk Interventions    10/18/2024   12:59 PM 09/07/2024    3:38 PM 02/18/2023    3:55 PM  Readmission Risk Prevention Plan  Transportation Screening Complete Complete Complete  PCP or Specialist Appt within 5-7 Days  Complete   Home Care Screening  Complete   Medication Review (RN CM)  Complete   HRI or Home Care Consult Complete    Social Work Consult for Recovery Care Planning/Counseling Complete    Palliative Care Screening Complete    Medication Review Oceanographer) Referral to Pharmacy  Referral to Pharmacy  HRI or Home Care Consult   Complete  SW Recovery Care/Counseling Consult   Complete  Palliative Care Screening   Complete  Skilled Nursing Facility   Not Applicable

## 2024-10-23 NOTE — Evaluation (Signed)
 RT Evaluate and Treat Note  10/23/2024   Breathing is (select one): Same as normal    The following was found on auscultation (select multiple):  Bilateral Breath Sounds: Clear;Diminished (10/22/24 2108)  R Upper  Breath Sounds: Clear;Diminished (10/22/24 2108) L Upper Breath Sounds: Clear;Diminished (10/22/24 2108) R Lower Breath Sounds: Clear;Diminished (10/22/24 2108) L Lower Breath Sounds: Clear;Diminished (10/22/24 2108)    Cough Assessment: Cough: Non-productive (10/21/24 0120)    Most Recent Chest Xray:... (No results found.    The following medications and/or interventions were ordered/changed/discontinued as part of the Respiratory Treatment protocol:   Medication Changes: No new changes at this time   Airway Clearance Changes: No new changes at this time  Oxygen  Therapy Changes: No new changes at this time

## 2024-10-23 NOTE — Progress Notes (Signed)
 PT Cancellation Note  Patient Details Name: Paul [name] MRN: [id number] DOB: [birth year]   Cancelled Treatment:    Reason Eval/Treat Not Completed: PT screened, no needs identified, will sign off (PT orders received; pt evaluated by PT on 10/31 and was independent. Please refer to that note for further detail).  Paul [name], PT, DPT Acute Rehabilitation Services Office [phone]    Paul [name] 10/23/2024, 10:59 AM

## 2024-10-23 NOTE — Progress Notes (Signed)
 PROGRESS NOTE    Paul [name]  FMW:[id number] DOB: July 14, 1946 DOA: 10/20/2024 PCP: [name], MD   Chief Complaint  Patient presents with   Shortness of Breath    Brief Narrative:   Paul [name] is a 78 y.o. male with medical history significant for COPD with nocturnal hypoxia on 2 L nightly, OSA on CPAP, chronic HFpEF, hypertension, type 2 diabetes, paroxysmal A-fib with Watchman device, hypothyroidism, chronic anxiety, former smoker, quit tobacco use more than 17 years ago, who presents to the ER with complaints of acute shortness of breath that woke him out of his sleep around 4 AM on 10/20/2024.  States his oxygen  saturation was 77% on home 2.5 L oxygen  by nasal cannula.  Also endorses increasing cough with greenish sputum production.  His wife brought him to [facility] ED for further management.    In the ER, tachypneic with O2 saturation of 100% on 3 L nasal cannula.  Chest x-ray with no evidence of pneumonia.  The patient received bronchodilator nebulizers and IV Solu-Medrol  in the ER.  TRH, hospitalist service, was asked to admit.  Accepted by Dr. Claudene and transferred to [facility] telemetry unit as observation status.   At the time of this visit, the patient is on CPAP.  States his breathing has improved after nebulizers treatment.  Has an appointment with his pulmonologist Dr. Darlean next Thursday, 10/28/2024.  The patient would benefit from pulmonary consult while in the hospital in order to address his COPD with frequent exacerbations.  5 presentations to the ER in the the last 5 weeks for COPD exacerbation.  IV azithromycin  added to his current regimen for its antibiotics and anti-inflammatory properties.    Assessment & Plan:   Active Problems:   COPD exacerbation (HCC)  Chronic respiratory failure with hypoxia COPD exacerbation with frequent exacerbations - this is his fourth hospitalization in 5 weeks, another ED visit, which is total 5 presentation to the hospital  for COPD exacerbation . - He is on oxygen  at baseline 2 to 3 L nasal cannula - Monitor input greatly appreciated, continue with current dose steroids and antibiotics. -Continue with IV steroids, decreased dose to 40 mg IV twice daily. - He was encouraged to use incentive spirometer and flutter valve. - Per pulmonary recommendation change Breztri  to Brovana , Pulmicort  and Yupelri  nebs  -Negative respiratory panel - Anxiety as well is a major factor in his recurrent presentation, as well fast eating habits where he aspirate on small pieces of food, he was counseled. - Overall much improved, dyspnea and cough has improved, I have encouraged to ambulate with staff today.   Hyperlipidemia Resume home Lipitor, fish oil.   GERD Resume home PPI, p.o. Protonix  40 mg twice daily.   Hypothyroidism Resume home levothyroxine .   Chronic anxiety Resume home Xanax  as needed and Zoloft .   OSA Resume CPAP nightly.   Chronic HFpEF Euvolemic Monitor strict I's and O's and daily weight.   Type 2 diabetes, well-controlled Hemoglobin A1c 6.8 on 10/14/2024. Diet controlled. CBG twice daily His diabetes might become exacerbated by IV Solu-Medrol , closely monitor CBGs and add subcu insulin  if indicated.   Paroxysmal A-fib with watchman device Heart rate is currently well controlled Monitor on telemetry     DVT prophylaxis: (Lovenox ) Code Status: (Full) Family Communication: (None at bedside) Disposition:   Status is: Observation    Consultants:  Monitoring   Subjective:  Patient reports he is feeling better today Objective: Vitals:   10/22/24 2105 10/22/24  2108 10/23/24 0155 10/23/24 0824  BP:   (!) 151/73 (!) 144/59  Pulse: 79  77 80  Resp: 16  17   Temp:    (!) 97.1 F (36.2 C)  TempSrc:    Oral  SpO2: 98% 98% 97% 98%  Weight:      Height:        Intake/Output Summary (Last 24 hours) at 10/23/2024 1424 Last data filed at 10/23/2024 0825 Gross per 24 hour  Intake 600 ml   Output 550 ml  Net 50 ml   Filed Weights   10/21/24 0052 10/22/24 0500  Weight: 72 kg 70.1 kg    Examination:  Awake Alert, Oriented X 3, No new F.N deficits, Normal affect Symmetrical Chest wall movement, Rales at the bases, but good air entry RRR,No Gallops,Rubs or new Murmurs, No Parasternal Heave +ve B.Sounds, Abd Soft, No tenderness, No rebound - guarding or rigidity. No Cyanosis, Clubbing or edema, No new Rash or bruise        Data Reviewed: I have personally reviewed following labs and imaging studies  CBC: Recent Labs  Lab 10/20/24 0838 10/21/24 0233 10/22/24 0331  WBC 13.8* 9.3 10.7*  NEUTROABS  --  8.3*  --   HGB 12.6* 10.7* 10.4*  HCT 42.8 35.4* 34.5*  MCV 81.4 78.7* 79.1*  PLT 267 235 202    Basic Metabolic Panel: Recent Labs  Lab 10/20/24 0838 10/21/24 0233 10/22/24 0331  NA 143 141 138  K 4.2 4.6 4.2  CL 100 97* 98  CO2 33* 32 27  GLUCOSE 122* 115* 132*  BUN 26* 21 20  CREATININE 0.89 0.88 1.01  CALCIUM  9.3 8.4* 8.3*  MG  --  2.2  --   PHOS  --  3.2  --     GFR: Estimated Creatinine Clearance: 51.6 mL/min (by C-G formula based on SCr of 1.01 mg/dL).  Liver Function Tests: No results for input(s): AST, ALT, ALKPHOS, BILITOT, PROT, ALBUMIN  in the last 168 hours.  CBG: Recent Labs  Lab 10/18/24 1621 10/18/24 2208 10/19/24 0853 10/21/24 0837 10/21/24 1756  GLUCAP 244* 182* 164* 144* 160*     Recent Results (from the past 240 hours)  Resp panel by RT-PCR (RSV, Flu A&B, Covid) Anterior Nasal Swab     Status: None   Collection Time: 10/14/24  9:25 PM   Specimen: Anterior Nasal Swab  Result Value Ref Range Status   SARS Coronavirus 2 by RT PCR NEGATIVE NEGATIVE Final    Comment: (NOTE) SARS-CoV-2 target nucleic acids are NOT DETECTED.  The SARS-CoV-2 RNA is generally detectable in upper respiratory specimens during the acute phase of infection. The lowest concentration of SARS-CoV-2 viral copies this assay can  detect is 138 copies/mL. A negative result does not preclude SARS-Cov-2 infection and should not be used as the sole basis for treatment or other patient management decisions. A negative result may occur with  improper specimen collection/handling, submission of specimen other than nasopharyngeal swab, presence of viral mutation(s) within the areas targeted by this assay, and inadequate number of viral copies(<138 copies/mL). A negative result must be combined with clinical observations, patient history, and epidemiological information. The expected result is Negative.  Fact Sheet for Patients:  bloggercourse.com  Fact Sheet for Healthcare Providers:  seriousbroker.it  This test is no t yet approved or cleared by the [place]  FDA and  has been authorized for detection and/or diagnosis of SARS-CoV-2 by FDA under an Emergency Use Authorization (EUA). This EUA will remain  in effect (meaning this test can be used) for the duration of the COVID-19 declaration under Section 564(b)(1) of the Act, 21 U.S.C.section 360bbb-3(b)(1), unless the authorization is terminated  or revoked sooner.       Influenza A by PCR NEGATIVE NEGATIVE Final   Influenza B by PCR NEGATIVE NEGATIVE Final    Comment: (NOTE) The Xpert Xpress SARS-CoV-2/FLU/RSV plus assay is intended as an aid in the diagnosis of influenza from Nasopharyngeal swab specimens and should not be used as a sole basis for treatment. Nasal washings and aspirates are unacceptable for Xpert Xpress SARS-CoV-2/FLU/RSV testing.  Fact Sheet for Patients: bloggercourse.com  Fact Sheet for Healthcare Providers: seriousbroker.it  This test is not yet approved or cleared by the [place]  FDA and has been authorized for detection and/or diagnosis of SARS-CoV-2 by FDA under an Emergency Use Authorization (EUA). This EUA will remain in  effect (meaning this test can be used) for the duration of the COVID-19 declaration under Section 564(b)(1) of the Act, 21 U.S.C. section 360bbb-3(b)(1), unless the authorization is terminated or revoked.     Resp Syncytial Virus by PCR NEGATIVE NEGATIVE Final    Comment: (NOTE) Fact Sheet for Patients: bloggercourse.com  Fact Sheet for Healthcare Providers: seriousbroker.it  This test is not yet approved or cleared by the [place]  FDA and has been authorized for detection and/or diagnosis of SARS-CoV-2 by FDA under an Emergency Use Authorization (EUA). This EUA will remain in effect (meaning this test can be used) for the duration of the COVID-19 declaration under Section 564(b)(1) of the Act, 21 U.S.C. section 360bbb-3(b)(1), unless the authorization is terminated or revoked.  Performed at [facility], [address] [place], [place] [address]   Respiratory (~20 pathogens) panel by PCR     Status: None   Collection Time: 10/21/24  1:52 PM   Specimen: Nasopharyngeal Swab; Respiratory  Result Value Ref Range Status   Adenovirus NOT DETECTED NOT DETECTED Final   Coronavirus 229E NOT DETECTED NOT DETECTED Final    Comment: (NOTE) The Coronavirus on the Respiratory Panel, DOES NOT test for the novel  Coronavirus (2019 nCoV)    Coronavirus HKU1 NOT DETECTED NOT DETECTED Final   Coronavirus NL63 NOT DETECTED NOT DETECTED Final   Coronavirus OC43 NOT DETECTED NOT DETECTED Final   Metapneumovirus NOT DETECTED NOT DETECTED Final   Rhinovirus / Enterovirus NOT DETECTED NOT DETECTED Final   Influenza A NOT DETECTED NOT DETECTED Final   Influenza B NOT DETECTED NOT DETECTED Final   Parainfluenza Virus 1 NOT DETECTED NOT DETECTED Final   Parainfluenza Virus 2 NOT DETECTED NOT DETECTED Final   Parainfluenza Virus 3 NOT DETECTED NOT DETECTED Final   Parainfluenza Virus 4 NOT DETECTED NOT DETECTED Final   Respiratory  Syncytial Virus NOT DETECTED NOT DETECTED Final   Bordetella pertussis NOT DETECTED NOT DETECTED Final   Bordetella Parapertussis NOT DETECTED NOT DETECTED Final   Chlamydophila pneumoniae NOT DETECTED NOT DETECTED Final   Mycoplasma pneumoniae NOT DETECTED NOT DETECTED Final    Comment: Performed at [facility], [address] [place], [place] [address]         Radiology Studies: No results found.       Scheduled Meds:  arformoterol   15 mcg Nebulization BID   atorvastatin   40 mg Oral QHS   [START ON 10/24/2024] azithromycin   500 mg Oral Daily   budesonide  (PULMICORT ) nebulizer solution  0.5 mg Nebulization BID   enoxaparin  (LOVENOX ) injection  40 mg Subcutaneous Q24H   levothyroxine   25 mcg Oral Q0600   methylPREDNISolone  (SOLU-MEDROL ) injection  40 mg Intravenous Q12H   omega-3 acid ethyl esters  1 g Oral Daily   pantoprazole   40 mg Oral BID AC   revefenacin   175 mcg Nebulization Daily   sertraline   50 mg Oral QHS   Continuous Infusions:     LOS: 2 days       Brayton Lye, MD [facility]   To contact the attending provider between 7A-7P or the covering provider during after hours 7P-7A, please log into the web site [email or web address] and access using universal Axis password for that web site. If you do not have the password, please call the hospital operator.  10/23/2024, 2:24 PM

## 2024-10-24 ENCOUNTER — Other Ambulatory Visit (HOSPITAL_COMMUNITY): Payer: Self-pay

## 2024-10-24 DIAGNOSIS — J441 Chronic obstructive pulmonary disease with (acute) exacerbation: Secondary | ICD-10-CM | POA: Diagnosis not present

## 2024-10-24 LAB — GLUCOSE, CAPILLARY: Glucose-Capillary: 138 mg/dL — ABNORMAL HIGH (ref 70–99)

## 2024-10-24 MED ORDER — AZITHROMYCIN 500 MG PO TABS
500.0000 mg | ORAL_TABLET | Freq: Every day | ORAL | 0 refills | Status: AC
  Filled 2024-10-24: qty 1, 1d supply, fill #0

## 2024-10-24 MED ORDER — PREDNISONE 20 MG PO TABS
ORAL_TABLET | ORAL | 0 refills | Status: AC
  Filled 2024-10-24: qty 45, 10d supply, fill #0

## 2024-10-24 NOTE — Plan of Care (Signed)

## 2024-10-24 NOTE — TOC Transition Note (Signed)
 Transition of Care [facility]) - Discharge Note   Patient Details  Name: Paul [name] MRN: [id number] Date of Birth: 11-Jun-1946  [facility]) CM/SW Contact:  Marval Gell, RN Phone Number: 10/24/2024, 9:18 AM   Clinical Narrative:     DC to home, no needs, MD placing referral to OP Pulm rehab.     Barriers to Discharge: Continued Medical Work up   Patient Goals and CMS Choice Patient states their goals for this hospitalization and ongoing recovery are:: to return home          Discharge Placement                       Discharge Plan and Services Additional resources added to the After Visit Summary for     Discharge Planning Services: CM Consult            DME Arranged: N/A           HH Agency: NA        Social Drivers of Health (SDOH) Interventions SDOH Screenings   Food Insecurity: No Food Insecurity (10/21/2024)  Housing: Low Risk  (10/21/2024)  Transportation Needs: No Transportation Needs (10/21/2024)  Utilities: Not At Risk (10/21/2024)  Alcohol Screen: Low Risk  (07/26/2024)  Depression (PHQ2-9): Low Risk  (07/26/2024)  Financial Resource Strain: Low Risk  (07/19/2023)  Physical Activity: Insufficiently Active (07/19/2023)  Social Connections: Socially Integrated (10/21/2024)  Stress: No Stress Concern Present (07/19/2023)  Tobacco Use: Medium Risk (10/20/2024)  Health Literacy: Adequate Health Literacy (07/23/2023)     Readmission Risk Interventions    10/18/2024   12:59 PM 09/07/2024    3:38 PM 02/18/2023    3:55 PM  Readmission Risk Prevention Plan  Transportation Screening Complete Complete Complete  PCP or Specialist Appt within 5-7 Days  Complete   Home Care Screening  Complete   Medication Review (RN CM)  Complete   HRI or Home Care Consult Complete    Social Work Consult for Recovery Care Planning/Counseling Complete    Palliative Care Screening Complete    Medication Review Oceanographer) Referral to Pharmacy  Referral  to Pharmacy  HRI or Home Care Consult   Complete  SW Recovery Care/Counseling Consult   Complete  Palliative Care Screening   Complete  Skilled Nursing Facility   Not Applicable

## 2024-10-24 NOTE — Discharge Summary (Signed)
 Physician Discharge Summary  Paul [name] FMW:[id number] DOB: 01-21-1946 DOA: 10/20/2024  PCP: [name], MD  Admit date: 10/20/2024 Discharge date: 10/24/2024  Admitted From: (Home) Disposition:  (Home )  Recommendations for Outpatient Follow-up:  Follow up with PCP in 1-2 weeks Please obtain BMP/CBC in one week Patient instructed to keep pulmonary follow-up appointment 10/28/2024, patient Patient will need referral for outpatient pulmonary rehab    Diet recommendation: Heart Healthy Brief/Interim Summary:   Paul [name] is a 78 y.o. male with medical history significant for COPD with nocturnal hypoxia on 2 L nightly, OSA on CPAP, chronic HFpEF, hypertension, type 2 diabetes, paroxysmal A-fib with Watchman device, hypothyroidism, chronic anxiety, former smoker, quit tobacco use more than 17 years ago, who presents to the ER with complaints of acute shortness of breath that woke him out of his sleep around 4 AM on 10/20/2024.  States his oxygen  saturation was 77% on home 2.5 L oxygen  by nasal cannula.  Also endorses increasing cough with greenish sputum production.  His wife brought him to [facility] ED for further management.    In the ER, tachypneic with O2 saturation of 100% on 3 L nasal cannula.  Chest x-ray with no evidence of pneumonia.  The patient received bronchodilator nebulizers and IV Solu-Medrol  in the ER.  TRH, hospitalist service, was asked to admit.  Accepted by Dr. Claudene and transferred to [facility] telemetry unit as observation status.   At the time of this visit, the patient is on CPAP.  States his breathing has improved after nebulizers treatment.  Has an appointment with his pulmonologist Dr. Darlean next Thursday, 10/28/2024.  The patient would benefit from pulmonary consult while in the hospital in order to address his COPD with frequent exacerbations.  5 presentations to the ER in the the last 5 weeks for COPD exacerbation.  IV azithromycin  added to his current  regimen for its antibiotics and anti-inflammatory properties.  Was seen by pulmonary during hospital stay.  Chronic respiratory failure with hypoxia COPD exacerbation with frequent exacerbations - this is his fourth hospitalization in 5 weeks, another ED visit, which is total 5 presentation to the hospital for COPD exacerbation . - He is on oxygen  at baseline 2 to 3 L nasal cannula - Pulmonary input greatly appreciated, he was on IV azithromycin , IV steroids, he is currently transition to prednisone  taper with recommendation by pulmonary to continue with 60 mg and reduce by 10 mg every 2 days till back to baseline of 10 mg, to finish total 5 days of azithromycin . - He was encouraged to use incentive spirometer and flutter valve at home as well. - During hospital stay, pulmonary changed Breztri  to Brovana , Pulmicort  and Yupelri  nebs as easier to comply with nebulizer than inhalers, at time of discharge she was transitioned back to his home Breztri  -Negative respiratory viral panel - Anxiety as well is a major factor in his recurrent presentation, as well fast eating habits where he aspirate on small pieces of food, he was counseled. - Much improved, ambulating with no dyspnea, at baseline,   Hyperlipidemia Resume home Lipitor, fish oil.   GERD Resume home PPI, p.o. Protonix  40 mg twice daily.   Hypothyroidism Resume home levothyroxine .   Chronic anxiety Resume home Xanax  as needed and Zoloft .   OSA Resume CPAP nightly.   Chronic HFpEF Euvolemic Monitor strict I's and O's and daily weight.   Type 2 diabetes, well-controlled Hemoglobin A1c 6.8 on 10/14/2024. Diet controlled.    Paroxysmal  A-fib with watchman device Heart rate is currently well controlled Monitor on telemetry       Discharge Diagnoses:  Active Problems:   COPD exacerbation [facility])    Discharge Instructions  Discharge Instructions     Diet - low sodium heart healthy   Complete by: As directed     Discharge instructions   Complete by: As directed    Follow with Primary MD [name], MD in 7 days   Get CBC, CMP,  checked  by Primary MD next visit.    Activity: As tolerated with Full fall precautions use walker/cane & assistance as needed   Disposition Home    Diet: Heart Healthy    On your next visit with your primary care physician please Get Medicines reviewed and adjusted.   Please request your Prim.MD to go over all Hospital Tests and Procedure/Radiological results at the follow up, please get all Hospital records sent to your Prim MD by signing hospital release before you go home.   If you experience worsening of your admission symptoms, develop shortness of breath, life threatening emergency, suicidal or homicidal thoughts you must seek medical attention immediately by calling 911 or calling your MD immediately  if symptoms less severe.  You Must read complete instructions/literature along with all the possible adverse reactions/side effects for all the Medicines you take and that have been prescribed to you. Take any new Medicines after you have completely understood and accpet all the possible adverse reactions/side effects.   Do not drive, operating heavy machinery, perform activities at heights, swimming or participation in water activities or provide baby sitting services if your were admitted for syncope or siezures until you have seen by Primary MD or a Neurologist and advised to do so again.  Do not drive when taking Pain medications.    Do not take more than prescribed Pain, Sleep and Anxiety Medications  Special Instructions: If you have smoked or chewed Tobacco  in the last 2 yrs please stop smoking, stop any regular Alcohol  and or any Recreational drug use.  Wear Seat belts while driving.   Please note  You were cared for by a hospitalist during your hospital stay. If you have any questions about your discharge medications or the care you received  while you were in the hospital after you are discharged, you can call the unit and asked to speak with the hospitalist on call if the hospitalist that took care of you is not available. Once you are discharged, your primary care physician will handle any further medical issues. Please note that NO REFILLS for any discharge medications will be authorized once you are discharged, as it is imperative that you return to your primary care physician (or establish a relationship with a primary care physician if you do not have one) for your aftercare needs so that they can reassess your need for medications and monitor your lab values.   Increase activity slowly   Complete by: As directed    No wound care   Complete by: As directed       Allergies as of 10/24/2024       Reactions   Tape Other (See Comments)   PLEASE USE COBAN WRAP IN LIEU OF TAPE, as it pulls off the skin!!   Daliresp  [roflumilast ] Diarrhea, Nausea And Vomiting   Levaquin  [levofloxacin ] Palpitations, Other (See Comments)   Made the B/P fluctuate and heart raced   Alendronate Anxiety, Other (See Comments)   Jittery/nervous  Medication List     TAKE these medications    Accu-Chek Softclix Lancets lancets Check fasting sugar once daily.   albuterol  (2.5 MG/3ML) 0.083% nebulizer solution Commonly known as: PROVENTIL  Take 3 mLs (2.5 mg total) by nebulization every 6 (six) hours as needed for shortness of breath or wheezing. What changed: when to take this   albuterol  108 (90 Base) MCG/ACT inhaler Commonly known as: Ventolin  HFA Inhale 1-2 puffs into the lungs every 6 (six) hours as needed for wheezing or shortness of breath. What changed: Another medication with the same name was changed. Make sure you understand how and when to take each.   ALPRAZolam  0.5 MG tablet Commonly known as: XANAX  Take 1 tablet (0.5 mg total) by mouth 2 (two) times daily as needed for anxiety. F41.1 What changed: when to take this    amLODipine  5 MG tablet Commonly known as: NORVASC  Take 1 tablet (5 mg total) by mouth daily.   aspirin  EC 81 MG tablet Take 1 tablet (81 mg total) by mouth daily. Swallow whole.   atorvastatin  40 MG tablet Commonly known as: LIPITOR TAKE 1 TABLET BY MOUTH EVERYDAY AT BEDTIME What changed: See the new instructions.   azithromycin  500 MG tablet Commonly known as: ZITHROMAX  Take 1 tablet (500 mg total) by mouth daily for 1 day. Start taking on: October 25, 2024   BLOOD GLUCOSE TEST STRIPS Strp Use to test blood sugar up to twice daily. May substitute to any manufacturer covered by patient's insurance. E11.0   Breztri  Aerosphere 160-9-4.8 MCG/ACT Aero inhaler Generic drug: budesonide -glycopyrrolate -formoterol  Inhale 2 puffs into the lungs 2 (two) times daily.   CALCIUM  PO Take 1 tablet by mouth daily.   dapagliflozin  propanediol 10 MG Tabs tablet Commonly known as: Farxiga  Take 1 tablet (10 mg total) by mouth daily.   denosumab  60 MG/ML Sosy injection Commonly known as: PROLIA  Inject 60 mg into the skin every 6 (six) months.   digoxin  0.125 MG tablet Commonly known as: LANOXIN  TAKE 1 TABLET BY MOUTH DAILY   ferrous sulfate  325 (65 FE) MG tablet Take 325 mg by mouth 2 (two) times daily with a meal. What changed: when to take this   Fish Oil 1200 MG Caps Take 1,200 mg by mouth in the morning and at bedtime.   furosemide  40 MG tablet Commonly known as: LASIX  Take 1 tablet (40 mg total) by mouth daily.   ipratropium-albuterol  0.5-2.5 (3) MG/3ML Soln Commonly known as: DUONEB Take 3 mLs by nebulization 2 (two) times daily for 10 days. What changed: when to take this   Lancets Misc. Misc Use to test blood sugar up to twice daily. May substitute to any manufacturer covered by patient's insurance. E11.0   levothyroxine  25 MCG tablet Commonly known as: SYNTHROID  Take 1 tablet (25 mcg total) by mouth daily. What changed: when to take this   Melatonin 12 MG  Tabs Take 12 mg by mouth at bedtime.   Mucinex  Maximum Strength 1200 MG Tb12 Generic drug: Guaifenesin  Take 1,200 mg by mouth daily.   multivitamin with minerals Tabs tablet Take 1 tablet by mouth daily with breakfast.   Nucala  100 MG/ML Sosy Generic drug: mepolizumab  Inject 100 mg into the skin every 30 (thirty) days.   OXYGEN  Inhale 2-3 L/min into the lungs continuous.   pantoprazole  40 MG tablet Commonly known as: PROTONIX  Take 1 tablet (40 mg total) by mouth 2 (two) times daily. What changed: when to take this   predniSONE  20 MG tablet Commonly  known as: DELTASONE  Take 3 tablets (60 mg total) by mouth daily for 2 days, THEN 2.5 tablets (50 mg total) daily for 2 days, THEN 2 tablets (40 mg total) daily for 2 days, THEN 1.5 tablets (30 mg total) daily for 2 days, THEN 1 tablet (20 mg total) daily for 2 days. THEN continue taking 0.5 tablet (10 mg total) daily. Start taking on: October 24, 2024 What changed:  medication strength See the new instructions.   Probiotic Daily Caps Take 1 capsule by mouth in the morning.   sertraline  50 MG tablet Commonly known as: ZOLOFT  Take 1 tablet (50 mg total) by mouth at bedtime.   vitamin C  1000 MG tablet Take 1,000 mg by mouth daily.   Vitamin D3 50 MCG (2000 UT) Tabs Take 2,000 Units by mouth in the morning.        Allergies  Allergen Reactions   Tape Other (See Comments)    PLEASE USE COBAN WRAP IN LIEU OF TAPE, as it pulls off the skin!!   Daliresp  [Roflumilast ] Diarrhea and Nausea And Vomiting   Levaquin  [Levofloxacin ] Palpitations and Other (See Comments)    Made the B/P fluctuate and heart raced   Alendronate Anxiety and Other (See Comments)    Jittery/nervous    Consultations: Pulmonary   Procedures/Studies: DG Chest Port 1 View Result Date: 10/20/2024 EXAM: 1 VIEW(S) XRAY OF THE CHEST 10/20/2024 08:57:47 AM COMPARISON: 10/14/2024 CLINICAL HISTORY: Shortness of breath. Reports SHOB since this morning.  Discharged from [place] yesterday for COPD exacerbation. States was sent home on 2L [place] Denies chest pain, dizziness, N/V, fevers RRT in triage FINDINGS: LUNGS AND PLEURA: Lung hyperinflation. No focal pulmonary opacity. No pulmonary edema. No pleural effusion. No pneumothorax. HEART AND MEDIASTINUM: Atherosclerotic calcifications. Left atrial appendage occlusion device noted. No acute abnormality of the cardiac and mediastinal silhouettes. BONES AND SOFT TISSUES: No acute osseous abnormality. IMPRESSION: 1. Lung hyperinflation, consistent with COPD. 2. No acute findings. Electronically signed by: Evalene Coho MD 10/20/2024 09:17 AM EDT RP Workstation: [id number]   CT Angio Chest/Abd/Pel for Dissection W and/or Wo Contrast Result Date: 10/14/2024 CLINICAL DATA:  Chest and right flank pain EXAM: CT ANGIOGRAPHY CHEST, ABDOMEN AND PELVIS TECHNIQUE: Non-contrast CT of the chest was initially obtained. Multidetector CT imaging through the chest, abdomen and pelvis was performed using the standard protocol during bolus administration of intravenous contrast. Multiplanar reconstructed images and MIPs were obtained and reviewed to evaluate the vascular anatomy. RADIATION DOSE REDUCTION: This exam was performed according to the departmental dose-optimization program which includes automated exposure control, adjustment of the mA and/or kV according to patient size and/or use of iterative reconstruction technique. CONTRAST:  OMNIPAQUE  IOHEXOL  350 MG/ML SOLN COMPARISON:  05/29/2023 FINDINGS: CTA CHEST FINDINGS Cardiovascular: Initial precontrast images demonstrate atherosclerotic calcification. No aneurysmal dilatation is seen. No cardiac enlargement is noted. Coronary calcifications are seen. Left atrial appendage occlusion device is noted in place. Postcontrast images show no evidence of aortic dissection. No pulmonary embolism is seen although not timed for embolus evaluation. Mediastinum/Nodes: Thoracic  inlet is within normal limits. No hilar or mediastinal adenopathy is noted. The esophagus as visualized is within normal limits. Lungs/Pleura: Diffuse emphysematous changes are noted. No focal infiltrate or sizable effusion is noted. No parenchymal nodule is seen. Musculoskeletal: Chronic compression deformities of the thoracic spine are noted involving T4, T5, T7, T8 and T9. Review of the MIP images confirms the above findings. CTA ABDOMEN AND PELVIS FINDINGS VASCULAR Aorta: Abdominal aorta demonstrates atherosclerotic calcifications.  No aneurysmal dilatation is seen. Celiac: Patent without evidence of aneurysm, dissection, vasculitis or significant stenosis. SMA: Patent without evidence of aneurysm, dissection, vasculitis or significant stenosis. Renals: Both renal arteries are patent without evidence of aneurysm, dissection, vasculitis, fibromuscular dysplasia or significant stenosis. IMA: Patent without evidence of aneurysm, dissection, vasculitis or significant stenosis. Inflow: Iliacs demonstrate atherosclerotic calcification without focal stenosis. Veins: No specific venous abnormality is noted. Review of the MIP images confirms the above findings. NON-VASCULAR Hepatobiliary: Fatty infiltration of the liver is noted. The gallbladder is partially distended. A few dependent gallstones are seen. No biliary ductal dilatation is noted. Pancreas: Unremarkable. No pancreatic ductal dilatation or surrounding inflammatory changes. Spleen: Normal in size without focal abnormality. Adrenals/Urinary Tract: Adrenal glands are within normal limits. Kidneys demonstrate a normal enhancement pattern bilaterally. No obstructive changes are noted. Evaluation for calculi is limited due to contrast enhancement. The bladder is well distended with opacified and unopacified urine. Stomach/Bowel: Scattered diverticular change of the colon is noted without evidence of diverticulitis. No obstructive changes are seen. The appendix is  within normal limits. Small bowel and stomach are unremarkable. Lymphatic: No lymphadenopathy is seen. Reproductive: Prostate is unremarkable. Other: No abdominal wall hernia or abnormality. No abdominopelvic ascites. Musculoskeletal: L1 compression deformity is noted with evidence of prior vertebral augmentation. Review of the MIP images confirms the above findings. IMPRESSION: CTA of the chest: No evidence of aortic dissection or pulmonary embolism. Pulmonary emphysema is present. Pulmonary emphysema is an independent risk factor for lung cancer. Recommend patient be considered for lung cancer screening. [place]  Chief Financial Officer. Screening for Lung Cancer: [place]  Licensed Conveyancer. JAMA. 2021;325(10):962-970. CT of the abdomen and pelvis: No acute arterial abnormality is noted. Cholelithiasis without complicating factors. Diverticulosis without diverticulitis. Multiple chronic thoracic and lumbar compression deformity is noted. Electronically Signed   By: Oneil Devonshire M.D.   On: 10/14/2024 23:53   DG Chest 1 View Result Date: 10/14/2024 EXAM: 1 VIEW XRAY OF THE CHEST 10/14/2024 09:54:00 PM COMPARISON: Comparison with 09/27/2024. CLINICAL HISTORY: SOB. Pt reports increased SOB for the past week. Recently seen here 2 weeks ago for same problem. Pt reports O2 saturation dropping into 70s at home while ambulating on 2L. Pt presents tachypneic with increased work of breathing, two word answers only. Admitted for COPD exacerbation 2 weeks ago. FINDINGS: LUNGS AND PLEURA: No focal pulmonary opacity. No pulmonary edema. No pleural effusion. No pneumothorax. HEART AND MEDIASTINUM: No acute abnormality of the cardiac and mediastinal silhouettes. BONES AND SOFT TISSUES: No acute osseous abnormality. IMPRESSION: 1. No acute cardiopulmonary process. Electronically signed by: Norman Gatlin MD 10/14/2024 09:58 PM EDT RP Workstation: [id number]   DG Chest Portable 1 View Result  Date: 09/27/2024 CLINICAL DATA:  Shortness of breath. EXAM: PORTABLE CHEST 1 VIEW COMPARISON:  09/08/2024 and CT chest 05/29/2023. FINDINGS: Trachea is midline. Heart size normal. Left atrial occlusion device. Lungs are hyperinflated. There may be subtle interstitial prominence in lingula and left lower lobe. No dense airspace consolidation or pleural fluid. IMPRESSION: Question subtle interstitial prominence in the lingula and left lower lobe, raising suspicion for an infectious bronchiolitis. Electronically Signed   By: Newell Eke M.D.   On: 09/27/2024 09:01   (Echo, Carotid, EGD, Colonoscopy, ERCP)    Subjective: Reports he is feeling good today, wants to go home, reports he ambulated comfortably with the staff multiple times yesterday  Discharge Exam: Vitals:   10/24/24 0426 10/24/24 0755  BP: (!) 155/75 (!) 146/65  Pulse:  76 75  Resp: 18 18  Temp: 97.7 F (36.5 C) 97.8 F (36.6 C)  SpO2:  98%   Vitals:   10/23/24 2008 10/23/24 2327 10/24/24 0426 10/24/24 0755  BP:  (!) 152/68 (!) 155/75 (!) 146/65  Pulse: 75 73 76 75  Resp: 16 17 18 18   Temp:  98 F (36.7 C) 97.7 F (36.5 C) 97.8 F (36.6 C)  TempSrc:  Oral Oral Oral  SpO2: 99%   98%  Weight:      Height:        General: Pt is alert, awake, not in acute distress Cardiovascular: RRR, S1/S2 +, no rubs, no gallops Respiratory: Good air entry bilaterally, scattered rhonchi Abdominal: Soft, NT, ND, bowel sounds + Extremities: no edema, no cyanosis    The results of significant diagnostics from this hospitalization (including imaging, microbiology, ancillary and laboratory) are listed below for reference.     Microbiology: Recent Results (from the past 240 hours)  Resp panel by RT-PCR (RSV, Flu A&B, Covid) Anterior Nasal Swab     Status: None   Collection Time: 10/14/24  9:25 PM   Specimen: Anterior Nasal Swab  Result Value Ref Range Status   SARS Coronavirus 2 by RT PCR NEGATIVE NEGATIVE Final    Comment:  (NOTE) SARS-CoV-2 target nucleic acids are NOT DETECTED.  The SARS-CoV-2 RNA is generally detectable in upper respiratory specimens during the acute phase of infection. The lowest concentration of SARS-CoV-2 viral copies this assay can detect is 138 copies/mL. A negative result does not preclude SARS-Cov-2 infection and should not be used as the sole basis for treatment or other patient management decisions. A negative result may occur with  improper specimen collection/handling, submission of specimen other than nasopharyngeal swab, presence of viral mutation(s) within the areas targeted by this assay, and inadequate number of viral copies(<138 copies/mL). A negative result must be combined with clinical observations, patient history, and epidemiological information. The expected result is Negative.  Fact Sheet for Patients:  bloggercourse.com  Fact Sheet for Healthcare Providers:  seriousbroker.it  This test is no t yet approved or cleared by the [place]  FDA and  has been authorized for detection and/or diagnosis of SARS-CoV-2 by FDA under an Emergency Use Authorization (EUA). This EUA will remain  in effect (meaning this test can be used) for the duration of the COVID-19 declaration under Section 564(b)(1) of the Act, 21 U.S.C.section 360bbb-3(b)(1), unless the authorization is terminated  or revoked sooner.       Influenza A by PCR NEGATIVE NEGATIVE Final   Influenza B by PCR NEGATIVE NEGATIVE Final    Comment: (NOTE) The Xpert Xpress SARS-CoV-2/FLU/RSV plus assay is intended as an aid in the diagnosis of influenza from Nasopharyngeal swab specimens and should not be used as a sole basis for treatment. Nasal washings and aspirates are unacceptable for Xpert Xpress SARS-CoV-2/FLU/RSV testing.  Fact Sheet for Patients: bloggercourse.com  Fact Sheet for Healthcare  Providers: seriousbroker.it  This test is not yet approved or cleared by the [place]  FDA and has been authorized for detection and/or diagnosis of SARS-CoV-2 by FDA under an Emergency Use Authorization (EUA). This EUA will remain in effect (meaning this test can be used) for the duration of the COVID-19 declaration under Section 564(b)(1) of the Act, 21 U.S.C. section 360bbb-3(b)(1), unless the authorization is terminated or revoked.     Resp Syncytial Virus by PCR NEGATIVE NEGATIVE Final    Comment: (NOTE) Fact Sheet for Patients: bloggercourse.com  Fact Sheet  for Healthcare Providers: seriousbroker.it  This test is not yet approved or cleared by the [place]  FDA and has been authorized for detection and/or diagnosis of SARS-CoV-2 by FDA under an Emergency Use Authorization (EUA). This EUA will remain in effect (meaning this test can be used) for the duration of the COVID-19 declaration under Section 564(b)(1) of the Act, 21 U.S.C. section 360bbb-3(b)(1), unless the authorization is terminated or revoked.  Performed at [facility], [address] [place], [place] [address]   Respiratory (~20 pathogens) panel by PCR     Status: None   Collection Time: 10/21/24  1:52 PM   Specimen: Nasopharyngeal Swab; Respiratory  Result Value Ref Range Status   Adenovirus NOT DETECTED NOT DETECTED Final   Coronavirus 229E NOT DETECTED NOT DETECTED Final    Comment: (NOTE) The Coronavirus on the Respiratory Panel, DOES NOT test for the novel  Coronavirus (2019 nCoV)    Coronavirus HKU1 NOT DETECTED NOT DETECTED Final   Coronavirus NL63 NOT DETECTED NOT DETECTED Final   Coronavirus OC43 NOT DETECTED NOT DETECTED Final   Metapneumovirus NOT DETECTED NOT DETECTED Final   Rhinovirus / Enterovirus NOT DETECTED NOT DETECTED Final   Influenza A NOT DETECTED NOT DETECTED Final   Influenza B NOT  DETECTED NOT DETECTED Final   Parainfluenza Virus 1 NOT DETECTED NOT DETECTED Final   Parainfluenza Virus 2 NOT DETECTED NOT DETECTED Final   Parainfluenza Virus 3 NOT DETECTED NOT DETECTED Final   Parainfluenza Virus 4 NOT DETECTED NOT DETECTED Final   Respiratory Syncytial Virus NOT DETECTED NOT DETECTED Final   Bordetella pertussis NOT DETECTED NOT DETECTED Final   Bordetella Parapertussis NOT DETECTED NOT DETECTED Final   Chlamydophila pneumoniae NOT DETECTED NOT DETECTED Final   Mycoplasma pneumoniae NOT DETECTED NOT DETECTED Final    Comment: Performed at [facility], [address] [place], [place] [address]     Labs: BNP (last 3 results) No results for input(s): BNP in the last 8760 hours. Basic Metabolic Panel: Recent Labs  Lab 10/20/24 0838 10/21/24 0233 10/22/24 0331  NA 143 141 138  K 4.2 4.6 4.2  CL 100 97* 98  CO2 33* 32 27  GLUCOSE 122* 115* 132*  BUN 26* 21 20  CREATININE 0.89 0.88 1.01  CALCIUM  9.3 8.4* 8.3*  MG  --  2.2  --   PHOS  --  3.2  --    Liver Function Tests: No results for input(s): AST, ALT, ALKPHOS, BILITOT, PROT, ALBUMIN  in the last 168 hours. No results for input(s): LIPASE, AMYLASE in the last 168 hours. No results for input(s): AMMONIA in the last 168 hours. CBC: Recent Labs  Lab 10/20/24 0838 10/21/24 0233 10/22/24 0331  WBC 13.8* 9.3 10.7*  NEUTROABS  --  8.3*  --   HGB 12.6* 10.7* 10.4*  HCT 42.8 35.4* 34.5*  MCV 81.4 78.7* 79.1*  PLT 267 235 202   Cardiac Enzymes: No results for input(s): CKTOTAL, CKMB, CKMBINDEX, TROPONINI in the last 168 hours. BNP: Invalid input(s): POCBNP CBG: Recent Labs  Lab 10/18/24 2208 10/19/24 0853 10/21/24 0837 10/21/24 1756 10/24/24 0757  GLUCAP 182* 164* 144* 160* 138*   D-Dimer No results for input(s): DDIMER in the last 72 hours. Hgb A1c No results for input(s): HGBA1C in the last 72 hours. Lipid Profile No results for input(s): CHOL,  HDL, LDLCALC, TRIG, CHOLHDL, LDLDIRECT in the last 72 hours. Thyroid  function studies No results for input(s): TSH, T4TOTAL, T3FREE, THYROIDAB  in the last 72 hours.  Invalid input(s): FREET3 Anemia work up No results for input(s): VITAMINB12, FOLATE, FERRITIN, TIBC, IRON, RETICCTPCT in the last 72 hours. Urinalysis    Component Value Date/Time   COLORURINE YELLOW 08/20/2023 1401   APPEARANCEUR CLEAR 08/20/2023 1401   LABSPEC <=1.005 08/20/2023 1401   PHURINE 5.5 08/20/2023 1401   GLUCOSEU >=500 (A) 08/20/2023 1401   HGBUR NEGATIVE 08/20/2023 1401   BILIRUBINUR NEGATIVE 08/20/2023 1401   BILIRUBINUR Negative 05/10/2021 0931   KETONESUR NEGATIVE 08/20/2023 1401   PROTEINUR NEGATIVE 08/20/2023 1401   UROBILINOGEN negative (A) 05/10/2021 0931   NITRITE NEGATIVE 08/20/2023 1401   LEUKOCYTESUR NEGATIVE 08/20/2023 1401   Sepsis Labs Recent Labs  Lab 10/20/24 0838 10/21/24 0233 10/22/24 0331  WBC 13.8* 9.3 10.7*   Microbiology Recent Results (from the past 240 hours)  Resp panel by RT-PCR (RSV, Flu A&B, Covid) Anterior Nasal Swab     Status: None   Collection Time: 10/14/24  9:25 PM   Specimen: Anterior Nasal Swab  Result Value Ref Range Status   SARS Coronavirus 2 by RT PCR NEGATIVE NEGATIVE Final    Comment: (NOTE) SARS-CoV-2 target nucleic acids are NOT DETECTED.  The SARS-CoV-2 RNA is generally detectable in upper respiratory specimens during the acute phase of infection. The lowest concentration of SARS-CoV-2 viral copies this assay can detect is 138 copies/mL. A negative result does not preclude SARS-Cov-2 infection and should not be used as the sole basis for treatment or other patient management decisions. A negative result may occur with  improper specimen collection/handling, submission of specimen other than nasopharyngeal swab, presence of viral mutation(s) within the areas targeted by this assay, and inadequate number of  viral copies(<138 copies/mL). A negative result must be combined with clinical observations, patient history, and epidemiological information. The expected result is Negative.  Fact Sheet for Patients:  bloggercourse.com  Fact Sheet for Healthcare Providers:  seriousbroker.it  This test is no t yet approved or cleared by the [place]  FDA and  has been authorized for detection and/or diagnosis of SARS-CoV-2 by FDA under an Emergency Use Authorization (EUA). This EUA will remain  in effect (meaning this test can be used) for the duration of the COVID-19 declaration under Section 564(b)(1) of the Act, 21 U.S.C.section 360bbb-3(b)(1), unless the authorization is terminated  or revoked sooner.       Influenza A by PCR NEGATIVE NEGATIVE Final   Influenza B by PCR NEGATIVE NEGATIVE Final    Comment: (NOTE) The Xpert Xpress SARS-CoV-2/FLU/RSV plus assay is intended as an aid in the diagnosis of influenza from Nasopharyngeal swab specimens and should not be used as a sole basis for treatment. Nasal washings and aspirates are unacceptable for Xpert Xpress SARS-CoV-2/FLU/RSV testing.  Fact Sheet for Patients: bloggercourse.com  Fact Sheet for Healthcare Providers: seriousbroker.it  This test is not yet approved or cleared by the [place]  FDA and has been authorized for detection and/or diagnosis of SARS-CoV-2 by FDA under an Emergency Use Authorization (EUA). This EUA will remain in effect (meaning this test can be used) for the duration of the COVID-19 declaration under Section 564(b)(1) of the Act, 21 U.S.C. section 360bbb-3(b)(1), unless the authorization is terminated or revoked.     Resp Syncytial Virus by PCR NEGATIVE NEGATIVE Final    Comment: (NOTE) Fact Sheet for Patients: bloggercourse.com  Fact Sheet for Healthcare  Providers: seriousbroker.it  This test is not yet approved or cleared by the [place]  FDA and has been authorized for  detection and/or diagnosis of SARS-CoV-2 by FDA under an Emergency Use Authorization (EUA). This EUA will remain in effect (meaning this test can be used) for the duration of the COVID-19 declaration under Section 564(b)(1) of the Act, 21 U.S.C. section 360bbb-3(b)(1), unless the authorization is terminated or revoked.  Performed at [facility], [address] [place], [place] [address]   Respiratory (~20 pathogens) panel by PCR     Status: None   Collection Time: 10/21/24  1:52 PM   Specimen: Nasopharyngeal Swab; Respiratory  Result Value Ref Range Status   Adenovirus NOT DETECTED NOT DETECTED Final   Coronavirus 229E NOT DETECTED NOT DETECTED Final    Comment: (NOTE) The Coronavirus on the Respiratory Panel, DOES NOT test for the novel  Coronavirus (2019 nCoV)    Coronavirus HKU1 NOT DETECTED NOT DETECTED Final   Coronavirus NL63 NOT DETECTED NOT DETECTED Final   Coronavirus OC43 NOT DETECTED NOT DETECTED Final   Metapneumovirus NOT DETECTED NOT DETECTED Final   Rhinovirus / Enterovirus NOT DETECTED NOT DETECTED Final   Influenza A NOT DETECTED NOT DETECTED Final   Influenza B NOT DETECTED NOT DETECTED Final   Parainfluenza Virus 1 NOT DETECTED NOT DETECTED Final   Parainfluenza Virus 2 NOT DETECTED NOT DETECTED Final   Parainfluenza Virus 3 NOT DETECTED NOT DETECTED Final   Parainfluenza Virus 4 NOT DETECTED NOT DETECTED Final   Respiratory Syncytial Virus NOT DETECTED NOT DETECTED Final   Bordetella pertussis NOT DETECTED NOT DETECTED Final   Bordetella Parapertussis NOT DETECTED NOT DETECTED Final   Chlamydophila pneumoniae NOT DETECTED NOT DETECTED Final   Mycoplasma pneumoniae NOT DETECTED NOT DETECTED Final    Comment: Performed at [facility], [address] [place], [place] [address]     Time  coordinating discharge: Over 30 minutes  SIGNED:   Brayton Lye, MD  [facility] 10/24/2024, 10:23 AM Pager   If 7PM-7AM, please contact night-coverage [email or web address] Password [email or web address]

## 2024-10-24 NOTE — Discharge Instructions (Signed)
 Follow with Primary MD Cox, [name], MD in 7 days   Get CBC, CMP,  checked  by Primary MD next visit.    Activity: As tolerated with Full fall precautions use walker/cane & assistance as needed   Disposition Home    Diet: Heart Healthy    On your next visit with your primary care physician please Get Medicines reviewed and adjusted.   Please request your Prim.MD to go over all Hospital Tests and Procedure/Radiological results at the follow up, please get all Hospital records sent to your Prim MD by signing hospital release before you go home.   If you experience worsening of your admission symptoms, develop shortness of breath, life threatening emergency, suicidal or homicidal thoughts you must seek medical attention immediately by calling 911 or calling your MD immediately  if symptoms less severe.  You Must read complete instructions/literature along with all the possible adverse reactions/side effects for all the Medicines you take and that have been prescribed to you. Take any new Medicines after you have completely understood and accpet all the possible adverse reactions/side effects.   Do not drive, operating heavy machinery, perform activities at heights, swimming or participation in water activities or provide baby sitting services if your were admitted for syncope or siezures until you have seen by Primary MD or a Neurologist and advised to do so again.  Do not drive when taking Pain medications.    Do not take more than prescribed Pain, Sleep and Anxiety Medications  Special Instructions: If you have smoked or chewed Tobacco  in the last 2 yrs please stop smoking, stop any regular Alcohol  and or any Recreational drug use.  Wear Seat belts while driving.   Please note  You were cared for by a hospitalist during your hospital stay. If you have any questions about your discharge medications or the care you received while you were in the hospital after you are discharged,  you can call the unit and asked to speak with the hospitalist on call if the hospitalist that took care of you is not available. Once you are discharged, your primary care physician will handle any further medical issues. Please note that NO REFILLS for any discharge medications will be authorized once you are discharged, as it is imperative that you return to your primary care physician (or establish a relationship with a primary care physician if you do not have one) for your aftercare needs so that they can reassess your need for medications and monitor your lab values.

## 2024-10-24 NOTE — Progress Notes (Signed)
 NAME:  Paul [name], MRN:  [id number], DOB:  [birth year], LOS: 3 ADMISSION DATE:  10/20/2024, CONSULTATION DATE: 10/22/2023 REFERRING MD: JONETTA Lye MD  , CHIEF COMPLAINT:   Dyspnea, wheezing  History of Present Illness:   The patient, with severe COPD and sleep apnea, presents with difficulty breathing and a COPD exacerbation.  Dyspnea and respiratory status - Severe, persistent difficulty breathing, recently worsening to the point of inability to talk or drink - Woke up at 4 AM with shortness of breath - Requires home oxygen : 2 L with CPAP, 3 L when active, with increased usage since mid-September - Experiences anxiety when unable to breathe, uses Xanax  for relief  Copd exacerbation - Current exacerbation possibly triggered by aspiration event while eating dinner - Coughed for approximately two hours before expelling a piece of corn - Yellow and green sputum production - History of wheezing, particularly severe during recent ER visit - Uses nebulizers and emergency inhalers at home, sometimes without sufficient relief  Hospitalizations and treatment history - Hospitalized four times since mid-September - This hospitalization occurred less than 24 hours after previous discharge  Chronic respiratory therapy - Uses CPAP machine, supplemental oxygen  - Uses Breztri  and chronic prednisone  at 10 mg/day - Started Nucala  three months ago  Pertinent  Medical History    has a past medical history of Anemia, Aortic atherosclerosis, Aspiration pneumonia due to inhalation of vomitus (HCC) (02/03/2022), Atrial fibrillation (HCC), Back pain, COPD (chronic obstructive pulmonary disease) (HCC), COPD exacerbation (HCC) (12/27/2021), COVID-19 (12/24/2020), Diabetes mellitus without complication (HCC), Dizziness (04/08/2022), Elevated PSA, GAD (generalized anxiety disorder), GERD (gastroesophageal reflux disease), High cholesterol, Hypertension, Lingular pneumonia (01/05/2021), Lung nodule < 6cm on  CT (05/29/2016), Osteoporosis, Oxygen  deficiency, Pneumonia, 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   Significant Hospital Events: Including procedures, antibiotic start and stop dates in addition to other pertinent events   10/29 admit  Interim History / Subjective:   Improving.  He feels at baseline.  Objective    Blood pressure (!) 146/65, pulse 75, temperature 97.8 F (36.6 C), temperature source Oral, resp. rate 18, height 5' 5 (1.651 m), weight 70.1 kg, SpO2 98%.       No intake or output data in the 24 hours ending 10/24/24 1346  Filed Weights   10/21/24 0052 10/22/24 0500  Weight: 72 kg 70.1 kg    Examination: Awake, no distress.  Sitting in chair at bedside Diminished air entry with no wheeze Heart regular rate and rhythm.  Lab/imaging reviewed Significant for negative COVID, flu, RSV Respiratory virus panel negative  Resolved problem list   Assessment and Plan  COPD with acute exacerbation Severe COPD with acute exacerbation, with four hospitalizations since mid-September. Experiencing shortness of breath and wheezing. Recent aspiration event may have triggered the exacerbation. On Nucala  for three months, which takes up to six months for full effect. Currently on Breztri , but nebulizers may be more effective due to difficulty inhaling Breztri  completely. - Continue Zithromax  for 5 days - Reduce Solu-Medrol  dose to 40 mg every 12.  Can transition to 60 mg prednisone  tomorrow with taper by 10 mg every 2 days - Continue Brovana , Pulmicort  and Yupelri  nebs.  Discharge on home Breztri  - Ensure oxygen  therapy at 3 L/min during activity and 2 L/min during rest - Anticipate discharge tomorrow.  He has follow-up in pulmonary clinic next week.  We will make a referral for pulmonary rehab as an outpatient.  Aspiration event (resolved) Recent aspiration  event involving a piece of corn, resolved after coughing. This event may have contributed to the current COPD exacerbation. - Advise careful eating habits to prevent future aspiration events  Obstructive sleep apnea Obstructive sleep apnea managed with CPAP  Anxiety symptoms Anxiety symptoms exacerbated by difficulty breathing. Xanax  helps to calm anxiety during episodes of shortness of breath. - Continue Xanax  as needed for anxiety  Signature:   Kimyah Frein MD [facility] See Amion for pager  If no response to pager , please call [phone] until 7pm After 7:00 pm call Elink  [phone] 10/24/2024, 1:46 PM

## 2024-10-25 ENCOUNTER — Telehealth: Payer: Self-pay

## 2024-10-25 NOTE — Transitions of Care (Post Inpatient/ED Visit) (Signed)
   10/25/2024  Name: Paul [name] MRN: [id number] DOB: [birth year]  Today's TOC FU Call Status: Today's TOC FU Call Status:: Successful TOC FU Call Completed TOC FU Call Complete Date: 10/25/24 [facility] RN spoke with patient who states he has [facility] coming and he really likes them and doesn't feel he needs the [facility] call program) Patient's Name and Date of Birth confirmed.  Transition Care Management Follow-up Telephone Call Date of Discharge: 10/24/24 Discharge Facility: [facility]) Type of Discharge: Inpatient Admission Primary Inpatient Discharge Diagnosis:: COPD exacerbation  Shona Prow RN, CCM [place]  VBCI-Population Health RN Care Manager [phone]

## 2024-10-26 DIAGNOSIS — F419 Anxiety disorder, unspecified: Secondary | ICD-10-CM | POA: Diagnosis not present

## 2024-10-26 DIAGNOSIS — J449 Chronic obstructive pulmonary disease, unspecified: Secondary | ICD-10-CM | POA: Diagnosis not present

## 2024-10-26 DIAGNOSIS — I5032 Chronic diastolic (congestive) heart failure: Secondary | ICD-10-CM | POA: Diagnosis not present

## 2024-10-28 ENCOUNTER — Encounter: Payer: Self-pay | Admitting: Adult Health

## 2024-10-28 ENCOUNTER — Ambulatory Visit: Admitting: Family Medicine

## 2024-10-28 ENCOUNTER — Telehealth: Payer: Self-pay | Admitting: Adult Health

## 2024-10-28 ENCOUNTER — Encounter: Payer: Self-pay | Admitting: Family Medicine

## 2024-10-28 ENCOUNTER — Ambulatory Visit: Admitting: Adult Health

## 2024-10-28 VITALS — BP 138/62 | HR 68 | Temp 98.2°F | Ht 65.0 in | Wt 155.0 lb

## 2024-10-28 VITALS — BP 152/62 | HR 98 | Temp 97.6°F | Ht 65.0 in | Wt 157.4 lb

## 2024-10-28 DIAGNOSIS — I5032 Chronic diastolic (congestive) heart failure: Secondary | ICD-10-CM | POA: Diagnosis not present

## 2024-10-28 DIAGNOSIS — F411 Generalized anxiety disorder: Secondary | ICD-10-CM

## 2024-10-28 DIAGNOSIS — G4733 Obstructive sleep apnea (adult) (pediatric): Secondary | ICD-10-CM | POA: Diagnosis not present

## 2024-10-28 DIAGNOSIS — J449 Chronic obstructive pulmonary disease, unspecified: Secondary | ICD-10-CM | POA: Diagnosis not present

## 2024-10-28 DIAGNOSIS — R5381 Other malaise: Secondary | ICD-10-CM | POA: Diagnosis not present

## 2024-10-28 DIAGNOSIS — E782 Mixed hyperlipidemia: Secondary | ICD-10-CM

## 2024-10-28 DIAGNOSIS — E1159 Type 2 diabetes mellitus with other circulatory complications: Secondary | ICD-10-CM | POA: Diagnosis not present

## 2024-10-28 DIAGNOSIS — R1310 Dysphagia, unspecified: Secondary | ICD-10-CM

## 2024-10-28 DIAGNOSIS — J441 Chronic obstructive pulmonary disease with (acute) exacerbation: Secondary | ICD-10-CM | POA: Diagnosis not present

## 2024-10-28 DIAGNOSIS — I152 Hypertension secondary to endocrine disorders: Secondary | ICD-10-CM

## 2024-10-28 MED ORDER — SERTRALINE HCL 100 MG PO TABS: 100.0000 mg | ORAL_TABLET | Freq: Every day | ORAL | 0 refills | Status: DC

## 2024-10-28 MED ORDER — ARFORMOTEROL TARTRATE 15 MCG/2ML IN NEBU: 15.0000 ug | INHALATION_SOLUTION | Freq: Two times a day (BID) | RESPIRATORY_TRACT | 11 refills | Status: AC

## 2024-10-28 MED ORDER — BUDESONIDE 0.25 MG/2ML IN SUSP
0.2500 mg | Freq: Two times a day (BID) | RESPIRATORY_TRACT | 12 refills | Status: AC
Start: 2024-10-28 — End: ?

## 2024-10-28 MED ORDER — REVEFENACIN 175 MCG/3ML IN SOLN: 175.0000 ug | Freq: Every day | RESPIRATORY_TRACT | 11 refills | Status: AC

## 2024-10-28 NOTE — Assessment & Plan Note (Addendum)
 Anxiety exacerbated by COPD symptoms. Current sertraline  dose is 50 mg, considering increase to 100 mg. - Increased sertraline  to 100 mg daily.

## 2024-10-28 NOTE — Assessment & Plan Note (Addendum)
 Well controlled.  No changes to medicines. Lipitor 40 mg once daily.  Continue to work on eating a healthy diet and exercise.   Orders:   CBC with Differential/Platelet   Comprehensive metabolic panel with GFR

## 2024-10-28 NOTE — Telephone Encounter (Signed)
 Okay great, I let them know we were stopping Nucala .

## 2024-10-28 NOTE — Assessment & Plan Note (Addendum)
 COPD exacerbation required hospitalization. Managed with nebulizer treatments and CPAP. - Changes per pulmonology - Use CPAP at least once daily, including during the day.

## 2024-10-28 NOTE — Progress Notes (Signed)
 @Patient  ID: Paul [name], male    DOB: 1946-02-10, 78 y.o.   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  10 mg daily, chronic respiratory failure on oxygen  and sleep apnea on CPAP with oxygen  Prone to recurrent COPD exacerbations and hospitalizations Medical history significant for diastolic heart failure, A-fib not on anticoagulation therapy due to severe bleeding secondary to hematuria and balance issues  TEST/EVENTS : Reviewed 10/28/2024  Intolerant to Daliresp  COVID-19 infection January 2022 requiring hospitalization Hospitalization February 2023 for rhinovirus with COPD exacerbation Hospitalization March 2023 and June 2023 for COPD exacerbation Hospitalization March 2024 COPD exacerbation March 2023 outpatient ABG without hypercarbia unable to qualify for NIV or BiPAP PFTs 01/2024 show stable lung function compared to 2017 with FEV1 at 33%, ratio 49, FVC 48%, diffusing capacity 41%. Some decline in DLCO  CT chest October 14, 2024 pulmonary emphysema no nodules noted, no acute process    Discussed the use of AI scribe software for clinical note transcription with the patient, who gave verbal consent to proceed.  History of Present Illness Paul [name] is a 78 year old male with COPD who presents for follow-up after multiple hospitalizations due to COPD exacerbations.  He has a history of COPD with multiple exacerbations leading to several hospitalizations since September.  Patient has been stable for several months with no hospitalization for 1.5 years until early September 2025.  Prior to recent admissions  maintaining activities such as going to the gym and playing golf in the setting of maximum maintenance regimen with triple therapy inhaler, chronic steroids and oxygen .  In early-September, he  experienced an exacerbation requiring an emergency room visit where he received nebulized bronchodilator breathing treatments along with prednisone  taper .  Patient had ongoing symptoms necessitating another ER visit on September 15th led to an admission and treatment with treatment with nebulized bronchodilators, antibiotics and prednisone  taper.  Patient had a routine office visit in August in stable condition at that time was recommended to begin Nucala  injections.  Patient received first injection in early to mid September.  Again in early October. In October, significant stress due to family health issues, including his nephew passing away unexpectedly  and a scare regarding his son's health, contributed to his anxiety and subsequent exacerbations. He was readmitted to the hospital after being discharged from what he felt was too soon , as he felt unprepared to go home. During his hospital stays, there were issues with receiving timely treatments, which added to his distress.  Hospital records have been reviewed in detail.  CT chest abdomen and pelvis on October 14, 2024 showed diffuse emphysematous changes without acute or pulmonary nodules noted.  Incidental finding of diverticulosis, chronic thoracic and lumbar compression deformity.  Respiratory viral panels were negative.  Cardiac markers were unrevealing.  BNP slightly elevated.  Patient did receive gentle diuresis.  Last echo February 2024 showed preserved EF, grade 1 diastolic dysfunction, RVSP 19 mmHg.  Since discharge last week patient says he is feeling better.  He has been able to use Xanax  and his albuterol  nebulizer during a recent exacerbation and was able to get through it without feeling that he needed to go to the emergency room.  He denies any chest pain orthopnea PND or increased leg swelling.  No hemoptysis.  He  is currently on a regimen that includes Breztri , prednisone  taper and oxygen  therapy. He has been receiving Nucala  injections  since August. He uses albuterol  as needed  Patient has sleep apnea is on nocturnal CPAP.  CPAP compliance shows excellent compliance with daily average usage at 9.5 hours.  AHI 0.6/hour.  Patient has perceived benefit.  His anxiety is managed with sertraline  and Xanax , which he takes when particularly upset. He has been experiencing exacerbated anxiety due to recent family stressors.  Patient has recently been seen by palliative care at home and recommended to discuss with primary care regarding increasing Zoloft  dosing.  His oxygen  levels are maintained in the mid-90s with adjustments to his oxygen  therapy as needed.  During one of his exacerbations patient did have an episode where he got choked while eating.  We discussed aspiration precautions.       Allergies  Allergen Reactions   Tape Other (See Comments)    PLEASE USE COBAN WRAP IN LIEU OF TAPE, as it pulls off the skin!!   Daliresp  [Roflumilast ] Diarrhea and Nausea And Vomiting   Levaquin  [Levofloxacin ] Palpitations and Other (See Comments)    Made the B/P fluctuate and heart raced   Alendronate Anxiety and Other (See Comments)    Jittery/nervous    Immunization History  Administered Date(s) Administered   Fluad Quad(high Dose 65+) 10/02/2020, 10/09/2021, 09/05/2022, 10/17/2022   Fluad Trivalent(High Dose 65+) 10/30/2023   INFLUENZA, HIGH DOSE SEASONAL PF 09/02/2016, 09/10/2017, 08/31/2018, 09/24/2018, 10/21/2019, 09/16/2024   Influenza Split 09/22/2013, 09/01/2015   Influenza Whole 09/04/2011, 09/21/2012   Influenza-Unspecified 08/23/2014   Moderna Covid-19 Vaccine Bivalent Booster 64yrs & up 10/09/2021   Moderna SARS-COV2 Booster Vaccination 07/05/2021   Moderna Sars-Covid-2 Vaccination 01/17/2020, 02/14/2020, 10/06/2020   PNEUMOCOCCAL CONJUGATE-20 12/05/2022   Pfizer(Comirnaty)Fall Seasonal Vaccine 12 years and older 10/31/2022, 10/30/2023   Pneumococcal Conjugate-13 01/11/2014, 12/09/2016   Pneumococcal  Polysaccharide-23 08/23/2010, 08/29/2011   Respiratory Syncytial Virus Vaccine,Recomb Aduvanted(Arexvy) 09/23/2022   Tdap 02/01/2019   Zoster Recombinant(Shingrix) 04/04/2014   Zoster, Live 04/05/2014    Past Medical History:  Diagnosis Date   Anemia    Aortic atherosclerosis    Aspiration pneumonia due to inhalation of vomitus (HCC) 02/03/2022   Atrial fibrillation (HCC)    Back pain    COPD (chronic obstructive pulmonary disease) (HCC)    COPD exacerbation (HCC) 12/27/2021   COVID-19 12/24/2020   Diabetes mellitus without complication (HCC)    Dizziness 04/08/2022   Elevated PSA    GAD (generalized anxiety disorder)    GERD (gastroesophageal reflux disease)    High cholesterol    Hypertension    Lingular pneumonia 01/05/2021   See cxr 01/06/20 rx with zpak/ cefipime x 5 days and then developed purulent sputum again 01/12/21 so rx levcaquin 500 mg daily x 7 days then return for cxr before more    Lung nodule < 6cm on CT 05/29/2016   Osteoporosis    Oxygen  deficiency    Pneumonia    January 2022   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 Cindie with a 20 mm Watchman FLX device   RSV bronchitis 01/27/2022   Vertebral compression fracture (HCC) 05/29/2019   T8 compression fracture noted on CT scan 05/28/2019 inpatient with chronic steroid dependent COPD - rx calcitonin nasal spray rx per PCP     Tobacco History: Social History   Tobacco Use  Smoking Status Former   Current packs/day: 0.00   Average packs/day:  2.0 packs/day for 52.0 years (104.0 ttl pk-yrs)   Types: Cigarettes   Start date: 02/03/1957   Quit date: 02/03/2009   Years since quitting: 15.7  Smokeless Tobacco Never  Tobacco Comments   Counseled to remain smoke free   Counseling given: Not Answered Tobacco comments: Counseled to remain smoke free   Outpatient Medications Prior to Visit  Medication Sig Dispense Refill   albuterol  (PROVENTIL ) (2.5 MG/3ML)  0.083% nebulizer solution Take 3 mLs (2.5 mg total) by nebulization every 6 (six) hours as needed for shortness of breath or wheezing. 360 mL 5   albuterol  (VENTOLIN  HFA) 108 (90 Base) MCG/ACT inhaler Inhale 1-2 puffs into the lungs every 6 (six) hours as needed for wheezing or shortness of breath. 18 g 2   ALPRAZolam  (XANAX ) 0.5 MG tablet Take 1 tablet (0.5 mg total) by mouth 2 (two) times daily as needed for anxiety. F41.1 30 tablet 0   amLODipine  (NORVASC ) 5 MG tablet Take 1 tablet (5 mg total) by mouth daily. 30 tablet 1   Ascorbic Acid  (VITAMIN C ) 1000 MG tablet Take 1,000 mg by mouth daily.     aspirin  EC 81 MG tablet Take 1 tablet (81 mg total) by mouth daily. Swallow whole. 90 tablet 3   atorvastatin  (LIPITOR) 40 MG tablet TAKE 1 TABLET BY MOUTH EVERYDAY AT BEDTIME 90 tablet 1   CALCIUM  PO Take 1 tablet by mouth daily.     Cholecalciferol  (VITAMIN D3) 50 MCG (2000 UT) TABS Take 2,000 Units by mouth in the morning.     dapagliflozin  propanediol (FARXIGA ) 10 MG TABS tablet Take 1 tablet (10 mg total) by mouth daily. 90 tablet 4   denosumab  (PROLIA ) 60 MG/ML SOSY injection Inject 60 mg into the skin every 6 (six) months. 180 mL 0   digoxin  (LANOXIN ) 0.125 MG tablet TAKE 1 TABLET BY MOUTH DAILY 90 tablet 2   ferrous sulfate  325 (65 FE) MG tablet Take 325 mg by mouth 2 (two) times daily with a meal. (Patient taking differently: Take 325 mg by mouth daily with breakfast.)     furosemide  (LASIX ) 40 MG tablet Take 1 tablet (40 mg total) by mouth daily. 30 tablet 1   Glucose Blood (BLOOD GLUCOSE TEST STRIPS) STRP Use to test blood sugar up to twice daily. May substitute to any manufacturer covered by patient's insurance. E11.0 100 strip 3   Guaifenesin  (MUCINEX  MAXIMUM STRENGTH) 1200 MG TB12 Take 1,200 mg by mouth daily.     Lancets Misc. MISC Use to test blood sugar up to twice daily. May substitute to any manufacturer covered by patient's insurance. E11.0 100 each 3   levothyroxine  (SYNTHROID ) 25  MCG tablet Take 1 tablet (25 mcg total) by mouth daily. 90 tablet 3   Melatonin 12 MG TABS Take 12 mg by mouth at bedtime.     Multiple Vitamin (MULTIVITAMIN WITH MINERALS) TABS tablet Take 1 tablet by mouth daily with breakfast.     NUCALA  100 MG/ML SOSY Inject 100 mg into the skin every 30 (thirty) days.     Omega-3 Fatty Acids  (FISH OIL) 1200 MG CAPS Take 1,200 mg by mouth in the morning and at bedtime.     OXYGEN  Inhale 2-3 L/min into the lungs continuous.     pantoprazole  (PROTONIX ) 40 MG tablet Take 1 tablet (40 mg total) by mouth 2 (two) times daily. 180 tablet 3   predniSONE  (DELTASONE ) 20 MG tablet Take 3 tablets (60 mg total) by mouth daily for 2 days, THEN  2.5 tablets (50 mg total) daily for 2 days, THEN 2 tablets (40 mg total) daily for 2 days, THEN 1.5 tablets (30 mg total) daily for 2 days, THEN 1 tablet (20 mg total) daily for 2 days. THEN continue taking 0.5 tablet (10 mg total) daily. 45 tablet 0   Probiotic Product (PROBIOTIC DAILY) CAPS Take 1 capsule by mouth in the morning.     budesonide -glycopyrrolate -formoterol  (BREZTRI  AEROSPHERE) 160-9-4.8 MCG/ACT AERO inhaler Inhale 2 puffs into the lungs 2 (two) times daily. 3 each 4   sertraline  (ZOLOFT ) 50 MG tablet Take 1 tablet (50 mg total) by mouth at bedtime. 90 tablet 1   Accu-Chek Softclix Lancets lancets Check fasting sugar once daily. 100 each 3   ipratropium-albuterol  (DUONEB) 0.5-2.5 (3) MG/3ML SOLN Take 3 mLs by nebulization 2 (two) times daily for 10 days. (Patient not taking: Reported on 10/28/2024) 90 mL 0   Facility-Administered Medications Prior to Visit  Medication Dose Route Frequency Provider Last Rate Last Admin   [START ON 10/30/2024] denosumab  (PROLIA ) injection 60 mg  60 mg Subcutaneous Once Cox, Kirsten, MD         Review of Systems:   Constitutional:   No  weight loss, night sweats,  Fevers, chills, +fatigue, or  lassitude.  HEENT:   No headaches,  Difficulty swallowing,  Tooth/dental problems, or  Sore  throat,                No sneezing, itching, ear ache, nasal congestion, post nasal drip,   CV:  No chest pain,  Orthopnea, PND, swelling in lower extremities, anasarca, dizziness, palpitations, syncope.   GI  No heartburn, indigestion, abdominal pain, nausea, vomiting, diarrhea, change in bowel habits, loss of appetite, bloody stools.   Resp:   No chest wall deformity  Skin: no rash or lesions.  GU: no dysuria, change in color of urine, no urgency or frequency.  No flank pain, no hematuria   MS:  No joint pain or swelling.  No decreased range of motion.  No back pain.    Physical Exam  BP (!) 152/62 Comment: rechecked due to elevated BP at initial reading  Pulse 98   Temp 97.6 F (36.4 C)   Ht 5' 5 (1.651 m) Comment: per pt  Wt 157 lb 6.4 oz (71.4 kg)   SpO2 98% Comment: 2L cont. o2  BMI 26.19 kg/m   GEN: A/Ox3; pleasant , NAD, elderly, frail-appearing   HEENT:  [place]/AT,  EACs-clear, TMs-wnl, NOSE-clear, THROAT-clear, no lesions, no postnasal drip or exudate noted.   NECK:  Supple w/ fair ROM; no JVD; normal carotid impulses w/o bruits; no thyromegaly or nodules palpated; no lymphadenopathy.    RESP decreased breath sounds in the bases  no accessory muscle use, no dullness to percussion  CARD:  RRR, no m/r/g, no peripheral edema, pulses intact, no cyanosis or clubbing.  GI:   Soft & nt; nml bowel sounds; no organomegaly or masses detected.   Musco: Warm bil, no deformities or joint swelling noted.   Neuro: alert, no focal deficits noted.    Skin: Warm, no lesions or rashes    Lab Results:Reviewed 10/28/2024   CBC    Component Value Date/Time   WBC 10.7 (H) 10/22/2024 0331   RBC 4.36 10/22/2024 0331   HGB 10.4 (L) 10/22/2024 0331   HGB 11.8 (L) 10/04/2024 0919   HCT 34.5 (L) 10/22/2024 0331   HCT 40.3 10/04/2024 0919   PLT 202 10/22/2024 0331   PLT 276  10/04/2024 0919   MCV 79.1 (L) 10/22/2024 0331   MCV 80 10/04/2024 0919   MCH 23.9 (L) 10/22/2024 0331    MCHC 30.1 10/22/2024 0331   RDW 17.4 (H) 10/22/2024 0331   RDW 17.9 (H) 10/04/2024 0919   LYMPHSABS 0.5 (L) 10/21/2024 0233   LYMPHSABS 1.0 10/04/2024 0919   MONOABS 0.4 10/21/2024 0233   EOSABS 0.0 10/21/2024 0233   EOSABS 0.0 10/04/2024 0919   BASOSABS 0.0 10/21/2024 0233   BASOSABS 0.0 10/04/2024 0919    BMET    Component Value Date/Time   NA 138 10/22/2024 0331   NA 145 (H) 10/04/2024 0919   K 4.2 10/22/2024 0331   CL 98 10/22/2024 0331   CO2 27 10/22/2024 0331   GLUCOSE 132 (H) 10/22/2024 0331   BUN 20 10/22/2024 0331   BUN 23 10/04/2024 0919   CREATININE 1.01 10/22/2024 0331   CALCIUM  8.3 (L) 10/22/2024 0331   GFRNONAA >60 10/22/2024 0331   GFRAA 107 11/20/2020 1202    BNP    Component Value Date/Time   BNP 100.8 (H) 05/12/2023 1626    ProBNP    Component Value Date/Time   PROBNP 262.0 10/14/2024 2124   PROBNP 194 10/04/2024 0919    Imaging: DG Chest Port 1 View Result Date: 10/20/2024 EXAM: 1 VIEW(S) XRAY OF THE CHEST 10/20/2024 08:57:47 AM COMPARISON: 10/14/2024 CLINICAL HISTORY: Shortness of breath. Reports SHOB since this morning. Discharged from [facility] yesterday for COPD exacerbation. States was sent home on 2L [place] Denies chest pain, dizziness, N/V, fevers RRT in triage FINDINGS: LUNGS AND PLEURA: Lung hyperinflation. No focal pulmonary opacity. No pulmonary edema. No pleural effusion. No pneumothorax. HEART AND MEDIASTINUM: Atherosclerotic calcifications. Left atrial appendage occlusion device noted. No acute abnormality of the cardiac and mediastinal silhouettes. BONES AND SOFT TISSUES: No acute osseous abnormality. IMPRESSION: 1. Lung hyperinflation, consistent with COPD. 2. No acute findings. Electronically signed by: Evalene Coho MD 10/20/2024 09:17 AM EDT RP Workstation: [id number]   CT Angio Chest/Abd/Pel for Dissection W and/or Wo Contrast Result Date: 10/14/2024 CLINICAL DATA:  Chest and right flank pain EXAM: CT ANGIOGRAPHY CHEST,  ABDOMEN AND PELVIS TECHNIQUE: Non-contrast CT of the chest was initially obtained. Multidetector CT imaging through the chest, abdomen and pelvis was performed using the standard protocol during bolus administration of intravenous contrast. Multiplanar reconstructed images and MIPs were obtained and reviewed to evaluate the vascular anatomy. RADIATION DOSE REDUCTION: This exam was performed according to the departmental dose-optimization program which includes automated exposure control, adjustment of the mA and/or kV according to patient size and/or use of iterative reconstruction technique. CONTRAST:  OMNIPAQUE  IOHEXOL  350 MG/ML SOLN COMPARISON:  05/29/2023 FINDINGS: CTA CHEST FINDINGS Cardiovascular: Initial precontrast images demonstrate atherosclerotic calcification. No aneurysmal dilatation is seen. No cardiac enlargement is noted. Coronary calcifications are seen. Left atrial appendage occlusion device is noted in place. Postcontrast images show no evidence of aortic dissection. No pulmonary embolism is seen although not timed for embolus evaluation. Mediastinum/Nodes: Thoracic inlet is within normal limits. No hilar or mediastinal adenopathy is noted. The esophagus as visualized is within normal limits. Lungs/Pleura: Diffuse emphysematous changes are noted. No focal infiltrate or sizable effusion is noted. No parenchymal nodule is seen. Musculoskeletal: Chronic compression deformities of the thoracic spine are noted involving T4, T5, T7, T8 and T9. Review of the MIP images confirms the above findings. CTA ABDOMEN AND PELVIS FINDINGS VASCULAR Aorta: Abdominal aorta demonstrates atherosclerotic calcifications. No aneurysmal dilatation is seen. Celiac: Patent without evidence of aneurysm,  dissection, vasculitis or significant stenosis. SMA: Patent without evidence of aneurysm, dissection, vasculitis or significant stenosis. Renals: Both renal arteries are patent without evidence of aneurysm, dissection,  vasculitis, fibromuscular dysplasia or significant stenosis. IMA: Patent without evidence of aneurysm, dissection, vasculitis or significant stenosis. Inflow: Iliacs demonstrate atherosclerotic calcification without focal stenosis. Veins: No specific venous abnormality is noted. Review of the MIP images confirms the above findings. NON-VASCULAR Hepatobiliary: Fatty infiltration of the liver is noted. The gallbladder is partially distended. A few dependent gallstones are seen. No biliary ductal dilatation is noted. Pancreas: Unremarkable. No pancreatic ductal dilatation or surrounding inflammatory changes. Spleen: Normal in size without focal abnormality. Adrenals/Urinary Tract: Adrenal glands are within normal limits. Kidneys demonstrate a normal enhancement pattern bilaterally. No obstructive changes are noted. Evaluation for calculi is limited due to contrast enhancement. The bladder is well distended with opacified and unopacified urine. Stomach/Bowel: Scattered diverticular change of the colon is noted without evidence of diverticulitis. No obstructive changes are seen. The appendix is within normal limits. Small bowel and stomach are unremarkable. Lymphatic: No lymphadenopathy is seen. Reproductive: Prostate is unremarkable. Other: No abdominal wall hernia or abnormality. No abdominopelvic ascites. Musculoskeletal: L1 compression deformity is noted with evidence of prior vertebral augmentation. Review of the MIP images confirms the above findings. IMPRESSION: CTA of the chest: No evidence of aortic dissection or pulmonary embolism. Pulmonary emphysema is present. Pulmonary emphysema is an independent risk factor for lung cancer. Recommend patient be considered for lung cancer screening. [place]  Chief Financial Officer. Screening for Lung Cancer: [place]  Licensed Conveyancer. JAMA. 2021;325(10):962-970. CT of the abdomen and pelvis: No acute arterial abnormality is noted.  Cholelithiasis without complicating factors. Diverticulosis without diverticulitis. Multiple chronic thoracic and lumbar compression deformity is noted. Electronically Signed   By: Oneil Devonshire M.D.   On: 10/14/2024 23:53   DG Chest 1 View Result Date: 10/14/2024 EXAM: 1 VIEW XRAY OF THE CHEST 10/14/2024 09:54:00 PM COMPARISON: Comparison with 09/27/2024. CLINICAL HISTORY: SOB. Pt reports increased SOB for the past week. Recently seen here 2 weeks ago for same problem. Pt reports O2 saturation dropping into 70s at home while ambulating on 2L. Pt presents tachypneic with increased work of breathing, two word answers only. Admitted for COPD exacerbation 2 weeks ago. FINDINGS: LUNGS AND PLEURA: No focal pulmonary opacity. No pulmonary edema. No pleural effusion. No pneumothorax. HEART AND MEDIASTINUM: No acute abnormality of the cardiac and mediastinal silhouettes. BONES AND SOFT TISSUES: No acute osseous abnormality. IMPRESSION: 1. No acute cardiopulmonary process. Electronically signed by: Norman Gatlin MD 10/14/2024 09:58 PM EDT RP Workstation: [id number]    mepolizumab  (NUCALA ) injection 100 mg     Date Action Dose Route User   Discharged on 10/24/2024   Admitted on 10/20/2024   Discharged on 10/19/2024   Admitted on 10/14/2024   Discharged on 09/30/2024   Admitted on 09/27/2024   Discharged on 09/11/2024   Admitted on 09/06/2024   09/02/2024 1501 Given 100 mg Subcutaneous (Left Arm) Hudson, Melissa A, RN      mepolizumab  (NUCALA ) injection 100 mg     Date Action Dose Route User   Discharged on 10/24/2024   Admitted on 10/20/2024   Discharged on 10/19/2024   Admitted on 10/14/2024   10/01/2024 0846 Given 100 mg Subcutaneous (Left Arm) Rannie Leita BRAVO, LPN          Latest Ref Rng & Units 02/16/2024    8:56 AM 05/29/2016    9:15 AM  PFT Results  FVC-Pre L 1.64  1.92   FVC-Predicted Pre % 48  52   FVC-Post L 1.54    FVC-Predicted Post % 45    Pre FEV1/FVC % % 49  41   Post FEV1/FCV %  % 48    FEV1-Pre L 0.80  0.78   FEV1-Predicted Pre % 33  29   FEV1-Post L 0.74    DLCO uncorrected ml/min/mmHg 8.82    DLCO UNC% % 41    DLCO corrected ml/min/mmHg 8.82    DLCO COR %Predicted % 41    DLVA Predicted % 62    TLC L 6.68    TLC % Predicted % 108    RV % Predicted % 181      No results found for: NITRICOXIDE      No data to display              Assessment & Plan:   Assessment and Plan Assessment & Plan Chronic obstructive pulmonary disease with frequent exacerbations and emphysema  -currently improved since recent hospitalization He experienced recurrent exacerbations in September and October, leading to multiple ER visits and hospitalizations. Currently, he is on Breztri , prednisone , and oxygen  therapy. Nucala  was started to reduce prednisone  dependency.  Unfortunately has had increased exacerbations since starting Nucala  for now we will stop Nucala . Stress and anxiety complicated recent hospitalizations, contributing to exacerbations,. Breztri  may be less effective due to inhalation difficulties, necessitating a switch to nebulizers. Continue Breztri  until nebulizers with budesonide , Brovana , and YUpelri  are approved.  Taper prednisone  to 15 mg and hold. Use albuterol  nebulizer twice daily as needed. Utilize CPAP during naps and as needed increase shortness of breath if helpful.   Monitor oxygen  levels, aiming for 2-3 L/min during the day.  To keep O2 saturations greater than 88 to 90%   Generalized anxiety disorder  -progressive Anxiety, exacerbated by recent family stressors, contributes to respiratory exacerbations. Managed with sertraline  and Xanax  as needed.. Discuss with Doctor Cox about increasing sertraline . Continue Xanax  for acute anxiety attacks. Implement non-pharmacological strategies, including relaxation techniques and family support. Address reducers discussed in detail  Obstructive sleep apnea on CPAP  -excellent control CPAP effectively manages  sleep apnea. Additional daytime use may aid respiratory support during exacerbations. Continue CPAP during sleep and encourage use during daytime naps.  Dysphagia with risk of aspiration   There is a risk of aspiration due to dysphagia, especially during anxiety or respiratory distress. Eat smaller meals, avoid talking while eating, remain upright after meals, and avoid eating late at night.  Muscle weakness and deconditioning   Muscle weakness and deconditioning are likely due to recent hospitalizations and reduced physical activity. Encourage walking and light physical activity to improve muscle strength. Consider physical therapy if no improvement in strength.  Hypertension   Blood pressure was elevated, possibly due to stress and recent hospitalizations. Previously managed with amlodipine  during hospital at home care. Discuss blood pressure management with Doctor Cox. Monitor blood pressure and adjust treatment as needed.  Diastolic heart failure with recent decompensation now improved.- Continue on current regimen and follow with cardiology.  2D echo ordered.   Plan  Patient Instructions  Change BREZTRI  to Budesonide  and Brovana  Neb Twice daily  and Yupelri  Neb daily .  Taper Prednisone  as directed to 15mg  daily -hold at this dose.  Stop Nucala   Mucinex  Twice daily  As needed  Cough/congestion  Flutter valve Three times a day  .  Albuterol  Neb or inhaler as needed  Wear Oxygen  2-3  l/m  with activity and At bedtime   to maintain O2 sats >88-90%.  Activity as tolerated.  Aspirations precautions as discussed.  Daily weights Continue on Lasix  as directed.  Continue with home palliative care  Discuss with Dr. Sherre regarding anxiety medications.  Continue on CPAP At bedtime with Oxygen  2l/m  and with daily naps and with increased shortness of breath if needed.  Follow up in 3-4 weeks and As needed  (Can see on 11/15/24 at 1130 am)  Please contact office for sooner follow up if symptoms  do not improve or worsen or seek emergency care          Isaias Dowson, NP 10/28/2024  I spent 45  minutes dedicated to the care of this patient on the date of this encounter to include pre-visit review of records, face-to-face time with the patient discussing conditions above, post visit ordering of testing, clinical documentation with the electronic health record, making appropriate referrals as documented, and communicating necessary findings to members of the patients care team.

## 2024-10-28 NOTE — Patient Instructions (Addendum)
 Change BREZTRI  to Budesonide  and Brovana  Neb Twice daily  and Yupelri  Neb daily .  Taper Prednisone  as directed to 15mg  daily -hold at this dose.  Stop Nucala   Mucinex  Twice daily  As needed  Cough/congestion  Flutter valve Three times a day  .  Albuterol  Neb or inhaler as needed  Wear Oxygen  2-3 l/m  with activity and At bedtime   to maintain O2 sats >88-90%.  Activity as tolerated.  Aspirations precautions as discussed.  Daily weights Continue on Lasix  as directed.  Continue with home palliative care  Discuss with Dr. Sherre regarding anxiety medications.  Continue on CPAP At bedtime with Oxygen  2l/m  and with daily naps and with increased shortness of breath if needed.  Follow up in 3-4 weeks and As needed  (Can see on 11/15/24 at 1130 am)  Please contact office for sooner follow up if symptoms do not improve or worsen or seek emergency care

## 2024-10-28 NOTE — Telephone Encounter (Signed)
 Thanks - I put order for Nucala  on hold so that it will not be released unless we restart.

## 2024-10-28 NOTE — Telephone Encounter (Signed)
 Hi Pharmacy team, Wanted to bounce this case off your team. This  Patient has Severe COPD , O2 RF , on chronic low dose steroids. No hospitalization for 1.64yr. Started on Nucala  in Sept 2025  -now 4 hospitalizations /2 ER visits in last 2 months . He Is due for Nucala  tomorrow. It may not be the reason but I am concerned that this is happening right after starting. He had 1 exacerbation prior to starting the Nucala  in Early Sept since then has been unable to recover with multiple admissions after. Concern should we hold off on additional injections until get him stabilized. CHecked last CBC , eosinophils were zero. I have notified the Nucala  company -they have submitted for adverse reaction.

## 2024-10-28 NOTE — Progress Notes (Unsigned)
 Subjective:  Patient ID: Paul [name], male    DOB: 09-14-1946  Age: 78 y.o. MRN: [id number]  Chief Complaint  Patient presents with   Hospitalization Follow-up    HPI: Discussed the use of AI scribe software for clinical note transcription with the patient, who gave verbal consent to proceed.  History of Present Illness Paul [name] is a 78 year old male with COPD who presents for follow-up after recent hospitalizations due to increased shortness of breath.  Dyspnea - Increased shortness of breath resulting in recent hospitalizations - Discharged on a Tuesday, but readmitted the following Wednesday due to persistent symptoms - During hospitalization, nebulizer treatment orders were not placed correctly, resulting in lack of treatment on a Saturday  Anxiety - Anxiety exacerbates breathing difficulties - Currently taking sertraline  for anxiety - Uses Xanax  as needed for acute anxiety episodes  Hypertension - Elevated blood pressure noted during recent pulmonology visit - Blood pressure has since stabilized Change BREZTRI  to Budesonide  and Brovana  Neb Twice daily  and Yupelri  Neb daily .  Taper Prednisone  as directed to 15mg  daily -hold at this dose.  Nucala  injection every month.  Mucinex  Twice daily  As needed  Cough/congestion  Flutter valve Three times a day  .  Albuterol  Neb or inhaler as needed  Wear Oxygen  2-3 l/m  with activity and At bedtime   to maintain O2 sats >88-90%.  Activity as tolerated.  Aspirations precautions as discussed.  Daily weights Continue on Lasix  as directed.  Continue with home palliative care  Discuss with Dr. Sherre regarding anxiety medications.  Continue on CPAP At bedtime with Oxygen  2l/m  and with daily naps and with increased shortness of breath if needed.  Follow up in 3-4 weeks and As needed  (Can see on 11/15/24 at 1130 am)       07/26/2024    8:34 AM 04/01/2024   10:35 AM 05/14/2023   11:23 AM 04/01/2023   11:15 AM 03/26/2023    8:54  AM  Depression screen PHQ 2/9  Decreased Interest 0 0 0 0 0  Down, Depressed, Hopeless 0 0 0 0 0  PHQ - 2 Score 0 0 0 0 0        05/20/2024    8:57 AM  Fall Risk   Falls in the past year? 0    Patient Care Team: [name], MD as PCP - General (Family Medicine) Jeffrie Oneil BROCKS, MD as PCP - Cardiology (Cardiology) Cindie Ole DASEN, MD as PCP - Electrophysiology (Cardiology) Brien Belvie FORBES, MD as Attending Physician (Pulmonary Disease) Darlean Ozell NOVAK, MD as Consulting Physician (Pulmonary Disease) [facility] as Registered Nurse [facility]) Carlin Delon BROCKS, NP as Nurse Practitioner (Cardiology)   Review of Systems  Current Outpatient Medications on File Prior to Visit  Medication Sig Dispense Refill   Accu-Chek Softclix Lancets lancets Check fasting sugar once daily. 100 each 3   albuterol  (PROVENTIL ) (2.5 MG/3ML) 0.083% nebulizer solution Take 3 mLs (2.5 mg total) by nebulization every 6 (six) hours as needed for shortness of breath or wheezing. 360 mL 5   albuterol  (VENTOLIN  HFA) 108 (90 Base) MCG/ACT inhaler Inhale 1-2 puffs into the lungs every 6 (six) hours as needed for wheezing or shortness of breath. 18 g 2   ALPRAZolam  (XANAX ) 0.5 MG tablet Take 1 tablet (0.5 mg total) by mouth 2 (two) times daily as needed for anxiety. F41.1 30 tablet 0   amLODipine  (NORVASC ) 5 MG  tablet Take 1 tablet (5 mg total) by mouth daily. 30 tablet 1   arformoterol  (BROVANA ) 15 MCG/2ML NEBU Take 2 mLs (15 mcg total) by nebulization 2 (two) times daily. 120 mL 11   Ascorbic Acid  (VITAMIN C ) 1000 MG tablet Take 1,000 mg by mouth daily.     aspirin  EC 81 MG tablet Take 1 tablet (81 mg total) by mouth daily. Swallow whole. 90 tablet 3   atorvastatin  (LIPITOR) 40 MG tablet TAKE 1 TABLET BY MOUTH EVERYDAY AT BEDTIME 90 tablet 1   budesonide  (PULMICORT ) 0.25 MG/2ML nebulizer solution Take 2 mLs (0.25 mg total) by nebulization 2 (two) times daily. 60 mL 12    CALCIUM  PO Take 1 tablet by mouth daily.     Cholecalciferol  (VITAMIN D3) 50 MCG (2000 UT) TABS Take 2,000 Units by mouth in the morning.     dapagliflozin  propanediol (FARXIGA ) 10 MG TABS tablet Take 1 tablet (10 mg total) by mouth daily. 90 tablet 4   denosumab  (PROLIA ) 60 MG/ML SOSY injection Inject 60 mg into the skin every 6 (six) months. 180 mL 0   digoxin  (LANOXIN ) 0.125 MG tablet TAKE 1 TABLET BY MOUTH DAILY 90 tablet 2   ferrous sulfate  325 (65 FE) MG tablet Take 325 mg by mouth 2 (two) times daily with a meal. (Patient taking differently: Take 325 mg by mouth daily with breakfast.)     furosemide  (LASIX ) 40 MG tablet Take 1 tablet (40 mg total) by mouth daily. 30 tablet 1   Glucose Blood (BLOOD GLUCOSE TEST STRIPS) STRP Use to test blood sugar up to twice daily. May substitute to any manufacturer covered by patient's insurance. E11.0 100 strip 3   Guaifenesin  (MUCINEX  MAXIMUM STRENGTH) 1200 MG TB12 Take 1,200 mg by mouth daily.     Lancets Misc. MISC Use to test blood sugar up to twice daily. May substitute to any manufacturer covered by patient's insurance. E11.0 100 each 3   levothyroxine  (SYNTHROID ) 25 MCG tablet Take 1 tablet (25 mcg total) by mouth daily. 90 tablet 3   Melatonin 12 MG TABS Take 12 mg by mouth at bedtime.     Multiple Vitamin (MULTIVITAMIN WITH MINERALS) TABS tablet Take 1 tablet by mouth daily with breakfast.     NUCALA  100 MG/ML SOSY Inject 100 mg into the skin every 30 (thirty) days.     Omega-3 Fatty Acids  (FISH OIL) 1200 MG CAPS Take 1,200 mg by mouth in the morning and at bedtime.     OXYGEN  Inhale 2-3 L/min into the lungs continuous.     pantoprazole  (PROTONIX ) 40 MG tablet Take 1 tablet (40 mg total) by mouth 2 (two) times daily. 180 tablet 3   predniSONE  (DELTASONE ) 20 MG tablet Take 3 tablets (60 mg total) by mouth daily for 2 days, THEN 2.5 tablets (50 mg total) daily for 2 days, THEN 2 tablets (40 mg total) daily for 2 days, THEN 1.5 tablets (30 mg total)  daily for 2 days, THEN 1 tablet (20 mg total) daily for 2 days. THEN continue taking 0.5 tablet (10 mg total) daily. 45 tablet 0   Probiotic Product (PROBIOTIC DAILY) CAPS Take 1 capsule by mouth in the morning.     revefenacin  (YUPELRI ) 175 MCG/3ML nebulizer solution Take 3 mLs (175 mcg total) by nebulization daily. 90 mL 11   Current Facility-Administered Medications on File Prior to Visit  Medication Dose Route Frequency Provider Last Rate Last Admin   [START ON 10/30/2024] denosumab  (PROLIA ) injection 60 mg  60 mg Subcutaneous Once [name], MD       Past Medical History:  Diagnosis Date   Anemia    Aortic atherosclerosis    Aspiration pneumonia due to inhalation of vomitus (HCC) 02/03/2022   Atrial fibrillation (HCC)    Back pain    COPD (chronic obstructive pulmonary disease) (HCC)    COPD exacerbation (HCC) 12/27/2021   COVID-19 12/24/2020   Diabetes mellitus without complication (HCC)    Dizziness 04/08/2022   Elevated PSA    GAD (generalized anxiety disorder)    GERD (gastroesophageal reflux disease)    High cholesterol    Hypertension    Lingular pneumonia 01/05/2021   See cxr 01/06/20 rx with zpak/ cefipime x 5 days and then developed purulent sputum again 01/12/21 so rx levcaquin 500 mg daily x 7 days then return for cxr before more    Lung nodule < 6cm on CT 05/29/2016   Osteoporosis    Oxygen  deficiency    Pneumonia    January 2022   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 Cindie with a 20 mm Watchman FLX device   RSV bronchitis 01/27/2022   Vertebral compression fracture (HCC) 05/29/2019   T8 compression fracture noted on CT scan 05/28/2019 inpatient with chronic steroid dependent COPD - rx calcitonin nasal spray rx per PCP    Past Surgical History:  Procedure Laterality Date   CATARACT EXTRACTION Right    ELBOW SURGERY  07/2022   surgery on olecranon bursa. Dr. Alyse   IR KYPHO EA ADDL LEVEL THORACIC OR LUMBAR   11/28/2021   LEFT ATRIAL APPENDAGE OCCLUSION N/A 08/02/2021   Procedure: LEFT ATRIAL APPENDAGE OCCLUSION;  Surgeon: Wonda Sharper, MD;  Location[facility] INV[facility] LAB;  Service: Cardiovascular;  Laterality: N/A;   SKIN SURGERY     shoulders and chest   TEE WITHOUT CARDIOVERSION N/A 08/02/2021   Procedure: TRANSESOPHAGEAL ECHOCARDIOGRAM (TEE);  Surgeon: Wonda Sharper, MD;  Location: [facility] INVASIVE CV LAB;  Service: Cardiovascular;  Laterality: N/A;   TEE WITHOUT CARDIOVERSION N/A 09/13/2021   Procedure: TRANSESOPHAGEAL ECHOCARDIOGRAM (TEE);  Surgeon: Francyne Headland, MD;  Location: [facility] ENDOSCOPY;  Service: Cardiovascular;  Laterality: N/A;    Family History  Problem Relation Age of Onset   Heart disease Brother    Heart disease Mother    Heart disease Father    Cancer Paternal Uncle    Social History   Socioeconomic History   Marital status: Married    Spouse name: Not on file   Number of children: 3   Years of education: Not on file   Highest education level: 10th grade  Occupational History   Occupation: retired    Comment: truck hospital doctor  Tobacco Use   Smoking status: Former    Current packs/day: 0.00    Average packs/day: 2.0 packs/day for 52.0 years (104.0 ttl pk-yrs)    Types: Cigarettes    Start date: 02/03/1957    Quit date: 02/03/2009    Years since quitting: 15.7   Smokeless tobacco: Never   Tobacco comments:    Counseled to remain smoke free  Vaping Use   Vaping status: Never Used  Substance and Sexual Activity   Alcohol use: Not Currently    Alcohol/week: 0.0 standard drinks of alcohol    Comment: occ   Drug use: No   Sexual activity: Not on file  Other Topics Concern   Not on file  Social History Narrative   Originally  from [place]. Previously worked driving a surveyor, minerals. No pets currently. No mold exposure. 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       wears sunscreen, brushes and flosses daily,  see's dentist bi-annually, has smoke/carbon monoxide detectors, wears a seatbelt and practices gun safety      Social Drivers of Health   Financial Resource Strain: Low Risk  (07/19/2023)   Overall Financial Resource Strain (CARDIA)    Difficulty of Paying Living Expenses: Not hard at all  Food Insecurity: No Food Insecurity (10/21/2024)   Hunger Vital Sign    Worried About Running Out of Food in the Last Year: Never true    Ran Out of Food in the Last Year: Never true  Transportation Needs: No Transportation Needs (10/21/2024)   PRAPARE - Administrator, Civil Service (Medical): No    Lack of Transportation (Non-Medical): No  Physical Activity: Insufficiently Active (07/19/2023)   Exercise Vital Sign    Days of Exercise per Week: 3 days    Minutes of Exercise per Session: 30 min  Stress: No Stress Concern Present (07/19/2023)   [employer] of Occupational Health - Occupational Stress Questionnaire    Feeling of Stress : Not at all  Social Connections: Socially Integrated (10/21/2024)   Social Connection and Isolation Panel    Frequency of Communication with Friends and Family: More than three times a week    Frequency of Social Gatherings with Friends and Family: More than three times a week    Attends Religious Services: More than 4 times per year    Active Member of [employer] or Organizations: Yes    Attends Engineer, Structural: More than 4 times per year    Marital Status: Married    Objective:  BP 138/62   Pulse 68   Temp 98.2 F (36.8 C)   Ht 5' 5 (1.651 m)   Wt 155 lb (70.3 kg)   SpO2 98%   BMI 25.79 kg/m      10/28/2024   10:59 AM 10/28/2024   10:18 AM 10/28/2024    8:35 AM  BP/Weight  Systolic BP 138 152 173  Diastolic BP 62 62 67  Wt. (Lbs) 155  157.4  BMI 25.79 kg/m2  26.19 kg/m2    Physical Exam  {Perform Simple Foot Exam  Perform Detailed exam:1} {Insert foot Exam (Optional):30965}   Lab Results  Component Value Date   WBC  10.7 (H) 10/22/2024   HGB 10.4 (L) 10/22/2024   HCT 34.5 (L) 10/22/2024   PLT 202 10/22/2024   GLUCOSE 132 (H) 10/22/2024   CHOL 158 07/05/2024   TRIG 199 (H) 07/05/2024   HDL 57 07/05/2024   LDLCALC 68 07/05/2024   ALT 71 (H) 10/04/2024   AST 28 10/04/2024   NA 138 10/22/2024   K 4.2 10/22/2024   CL 98 10/22/2024   CREATININE 1.01 10/22/2024   BUN 20 10/22/2024   CO2 27 10/22/2024   TSH 0.495 09/08/2024   INR 0.9 01/25/2023   HGBA1C 6.8 10/14/2024    Results for orders placed or performed during the hospital encounter of 10/20/24  Basic metabolic panel   Collection Time: 10/20/24  8:38 AM  Result Value Ref Range   Sodium 143 135 - 145 mmol/L   Potassium 4.2 3.5 - 5.1 mmol/L   Chloride 100 98 - 111 mmol/L   CO2 33 (H) 22 - 32 mmol/L  Glucose, Bld 122 (H) 70 - 99 mg/dL   BUN 26 (H) 8 - 23 mg/dL   Creatinine, Ser 9.10 0.61 - 1.24 mg/dL   Calcium  9.3 8.9 - 10.3 mg/dL   GFR, Estimated >39 >39 mL/min   Anion gap 11 5 - 15  CBC   Collection Time: 10/20/24  8:38 AM  Result Value Ref Range   WBC 13.8 (H) 4.0 - 10.5 K/uL   RBC 5.26 4.22 - 5.81 MIL/uL   Hemoglobin 12.6 (L) 13.0 - 17.0 g/dL   HCT 57.1 60.9 - 47.9 %   MCV 81.4 80.0 - 100.0 fL   MCH 24.0 (L) 26.0 - 34.0 pg   MCHC 29.4 (L) 30.0 - 36.0 g/dL   RDW 81.8 (H) 88.4 - 84.4 %   Platelets 267 150 - 400 K/uL   nRBC 0.0 0.0 - 0.2 %  Troponin T, High Sensitivity   Collection Time: 10/20/24  8:38 AM  Result Value Ref Range   Troponin T High Sensitivity 18 0 - 19 ng/L  Troponin T, High Sensitivity   Collection Time: 10/20/24 10:58 AM  Result Value Ref Range   Troponin T High Sensitivity 18 0 - 19 ng/L  CBC with Differential/Platelet   Collection Time: 10/21/24  2:33 AM  Result Value Ref Range   WBC 9.3 4.0 - 10.5 K/uL   RBC 4.50 4.22 - 5.81 MIL/uL   Hemoglobin 10.7 (L) 13.0 - 17.0 g/dL   HCT 64.5 (L) 60.9 - 47.9 %   MCV 78.7 (L) 80.0 - 100.0 fL   MCH 23.8 (L) 26.0 - 34.0 pg   MCHC 30.2 30.0 - 36.0 g/dL   RDW  82.2 (H) 88.4 - 15.5 %   Platelets 235 150 - 400 K/uL   nRBC 0.0 0.0 - 0.2 %   Neutrophils Relative % 89 %   Neutro Abs 8.3 (H) 1.7 - 7.7 K/uL   Lymphocytes Relative 5 %   Lymphs Abs 0.5 (L) 0.7 - 4.0 K/uL   Monocytes Relative 5 %   Monocytes Absolute 0.4 0.1 - 1.0 K/uL   Eosinophils Relative 0 %   Eosinophils Absolute 0.0 0.0 - 0.5 K/uL   Basophils Relative 0 %   Basophils Absolute 0.0 0.0 - 0.1 K/uL   Immature Granulocytes 1 %   Abs Immature Granulocytes 0.07 0.00 - 0.07 K/uL  Basic metabolic panel   Collection Time: 10/21/24  2:33 AM  Result Value Ref Range   Sodium 141 135 - 145 mmol/L   Potassium 4.6 3.5 - 5.1 mmol/L   Chloride 97 (L) 98 - 111 mmol/L   CO2 32 22 - 32 mmol/L   Glucose, Bld 115 (H) 70 - 99 mg/dL   BUN 21 8 - 23 mg/dL   Creatinine, Ser 9.11 0.61 - 1.24 mg/dL   Calcium  8.4 (L) 8.9 - 10.3 mg/dL   GFR, Estimated >39 >39 mL/min   Anion gap 12 5 - 15  Magnesium    Collection Time: 10/21/24  2:33 AM  Result Value Ref Range   Magnesium  2.2 1.7 - 2.4 mg/dL  Phosphorus   Collection Time: 10/21/24  2:33 AM  Result Value Ref Range   Phosphorus 3.2 2.5 - 4.6 mg/dL  Glucose, capillary   Collection Time: 10/21/24  8:37 AM  Result Value Ref Range   Glucose-Capillary 144 (H) 70 - 99 mg/dL  Respiratory (~79 pathogens) panel by PCR   Collection Time: 10/21/24  1:52 PM   Specimen: Nasopharyngeal Swab; Respiratory  Result Value Ref  Range   Adenovirus NOT DETECTED NOT DETECTED   Coronavirus 229E NOT DETECTED NOT DETECTED   Coronavirus HKU1 NOT DETECTED NOT DETECTED   Coronavirus NL63 NOT DETECTED NOT DETECTED   Coronavirus OC43 NOT DETECTED NOT DETECTED   Metapneumovirus NOT DETECTED NOT DETECTED   Rhinovirus / Enterovirus NOT DETECTED NOT DETECTED   Influenza A NOT DETECTED NOT DETECTED   Influenza B NOT DETECTED NOT DETECTED   Parainfluenza Virus 1 NOT DETECTED NOT DETECTED   Parainfluenza Virus 2 NOT DETECTED NOT DETECTED   Parainfluenza Virus 3 NOT DETECTED NOT  DETECTED   Parainfluenza Virus 4 NOT DETECTED NOT DETECTED   Respiratory Syncytial Virus NOT DETECTED NOT DETECTED   Bordetella pertussis NOT DETECTED NOT DETECTED   Bordetella Parapertussis NOT DETECTED NOT DETECTED   Chlamydophila pneumoniae NOT DETECTED NOT DETECTED   Mycoplasma pneumoniae NOT DETECTED NOT DETECTED  Glucose, capillary   Collection Time: 10/21/24  5:56 PM  Result Value Ref Range   Glucose-Capillary 160 (H) 70 - 99 mg/dL  CBC   Collection Time: 10/22/24  3:31 AM  Result Value Ref Range   WBC 10.7 (H) 4.0 - 10.5 K/uL   RBC 4.36 4.22 - 5.81 MIL/uL   Hemoglobin 10.4 (L) 13.0 - 17.0 g/dL   HCT 65.4 (L) 60.9 - 47.9 %   MCV 79.1 (L) 80.0 - 100.0 fL   MCH 23.9 (L) 26.0 - 34.0 pg   MCHC 30.1 30.0 - 36.0 g/dL   RDW 82.5 (H) 88.4 - 84.4 %   Platelets 202 150 - 400 K/uL   nRBC 0.0 0.0 - 0.2 %  Basic metabolic panel with GFR   Collection Time: 10/22/24  3:31 AM  Result Value Ref Range   Sodium 138 135 - 145 mmol/L   Potassium 4.2 3.5 - 5.1 mmol/L   Chloride 98 98 - 111 mmol/L   CO2 27 22 - 32 mmol/L   Glucose, Bld 132 (H) 70 - 99 mg/dL   BUN 20 8 - 23 mg/dL   Creatinine, Ser 8.98 0.61 - 1.24 mg/dL   Calcium  8.3 (L) 8.9 - 10.3 mg/dL   GFR, Estimated >39 >39 mL/min   Anion gap 13 5 - 15  Glucose, capillary   Collection Time: 10/24/24  7:57 AM  Result Value Ref Range   Glucose-Capillary 138 (H) 70 - 99 mg/dL  .  Assessment & Plan:   Assessment & Plan Chronic obstructive pulmonary disease, unspecified COPD type (HCC) COPD exacerbation required hospitalization. Managed with nebulizer treatments and CPAP. - Continue three nebulizer treatments daily, two with two medications and one with one medication. - Use CPAP at least once daily, including during the day.    Mixed hyperlipidemia Well controlled.  No changes to medicines. Lipitor 40 mg once daily.  Continue to work on eating a healthy diet and exercise.   Orders:   CBC with Differential/Platelet    Comprehensive metabolic panel with GFR   GAD (generalized anxiety disorder) Anxiety exacerbated by COPD symptoms. Current sertraline  dose is 50 mg, considering increase to 100 mg. - Increased sertraline  to 100 mg daily.    Hypertension associated with diabetes (HCC) Elevated blood pressure noted during hospitalization and pulmonologist visit.     Body mass index is 25.79 kg/m.    Meds ordered this encounter  Medications   sertraline  (ZOLOFT ) 100 MG tablet    Sig: Take 1 tablet (100 mg total) by mouth daily.    Dispense:  90 tablet    Refill:  0  Orders Placed This Encounter  Procedures   CBC with Differential/Platelet   Comprehensive metabolic panel with GFR     I,Marla I Leal-Borjas,acting as a scribe for [name] Free, MD.,have documented all relevant documentation on the behalf of [name] Free, MD,as directed by  [name] Free, MD while in the presence of [name] Free, MD.   Follow-up: No follow-ups on file.  An After Visit Summary was printed and given to the patient.  [name] Free, MD [facility] [phone]

## 2024-10-29 ENCOUNTER — Ambulatory Visit: Attending: Cardiology

## 2024-10-29 ENCOUNTER — Ambulatory Visit

## 2024-10-29 DIAGNOSIS — I5032 Chronic diastolic (congestive) heart failure: Secondary | ICD-10-CM | POA: Insufficient documentation

## 2024-10-29 DIAGNOSIS — L578 Other skin changes due to chronic exposure to nonionizing radiation: Secondary | ICD-10-CM | POA: Diagnosis not present

## 2024-10-29 DIAGNOSIS — L814 Other melanin hyperpigmentation: Secondary | ICD-10-CM | POA: Diagnosis not present

## 2024-10-29 DIAGNOSIS — L820 Inflamed seborrheic keratosis: Secondary | ICD-10-CM | POA: Diagnosis not present

## 2024-10-29 DIAGNOSIS — C4442 Squamous cell carcinoma of skin of scalp and neck: Secondary | ICD-10-CM | POA: Diagnosis not present

## 2024-10-29 DIAGNOSIS — L570 Actinic keratosis: Secondary | ICD-10-CM | POA: Diagnosis not present

## 2024-10-29 LAB — COMPREHENSIVE METABOLIC PANEL WITH GFR
ALT: 67 IU/L — ABNORMAL HIGH (ref 0–44)
AST: 29 IU/L (ref 0–40)
Albumin: 4.1 g/dL (ref 3.8–4.8)
Alkaline Phosphatase: 90 IU/L (ref 47–123)
BUN/Creatinine Ratio: 21 (ref 10–24)
BUN: 21 mg/dL (ref 8–27)
Bilirubin Total: 0.3 mg/dL (ref 0.0–1.2)
CO2: 28 mmol/L (ref 20–29)
Calcium: 9 mg/dL (ref 8.6–10.2)
Chloride: 95 mmol/L — ABNORMAL LOW (ref 96–106)
Creatinine, Ser: 1 mg/dL (ref 0.76–1.27)
Globulin, Total: 2.2 g/dL (ref 1.5–4.5)
Glucose: 173 mg/dL — ABNORMAL HIGH (ref 70–99)
Potassium: 4.1 mmol/L (ref 3.5–5.2)
Sodium: 142 mmol/L (ref 134–144)
Total Protein: 6.3 g/dL (ref 6.0–8.5)
eGFR: 77 mL/min/1.73 (ref >59)

## 2024-10-29 LAB — CBC WITH DIFFERENTIAL/PLATELET
Basophils Absolute: 0 x10E3/uL (ref 0.0–0.2)
Basos: 0 %
EOS (ABSOLUTE): 0 x10E3/uL (ref 0.0–0.4)
Eos: 0 %
Hematocrit: 42.4 % (ref 37.5–51.0)
Hemoglobin: 12.9 g/dL — ABNORMAL LOW (ref 13.0–17.7)
Immature Grans (Abs): 0.2 x10E3/uL — ABNORMAL HIGH (ref 0.0–0.1)
Immature Granulocytes: 1 %
Lymphocytes Absolute: 0.4 x10E3/uL — ABNORMAL LOW (ref 0.7–3.1)
Lymphs: 3 %
MCH: 24.2 pg — ABNORMAL LOW (ref 26.6–33.0)
MCHC: 30.4 g/dL — ABNORMAL LOW (ref 31.5–35.7)
MCV: 80 fL (ref 79–97)
Monocytes Absolute: 0.2 x10E3/uL (ref 0.1–0.9)
Monocytes: 1 %
Neutrophils Absolute: 15.5 x10E3/uL — ABNORMAL HIGH (ref 1.4–7.0)
Neutrophils: 95 %
Platelets: 290 x10E3/uL (ref 150–450)
RBC: 5.33 x10E6/uL (ref 4.14–5.80)
RDW: 16.6 % — ABNORMAL HIGH (ref 11.6–15.4)
WBC: 16.3 x10E3/uL — ABNORMAL HIGH (ref 3.4–10.8)

## 2024-10-29 LAB — ECHOCARDIOGRAM COMPLETE
Area-P 1/2: 3.92 cm2
S' Lateral: 2.85 cm

## 2024-10-31 ENCOUNTER — Ambulatory Visit: Payer: Self-pay | Admitting: Family Medicine

## 2024-11-04 ENCOUNTER — Ambulatory Visit (INDEPENDENT_AMBULATORY_CARE_PROVIDER_SITE_OTHER)

## 2024-11-04 ENCOUNTER — Ambulatory Visit

## 2024-11-04 DIAGNOSIS — M810 Age-related osteoporosis without current pathological fracture: Secondary | ICD-10-CM

## 2024-11-04 NOTE — Progress Notes (Deleted)
 Cardiology Office Note:    Date:  11/04/2024   ID:  Paul [name], DOB 1945-12-30, MRN [id number]  PCP:  [name], MD   [facility] Providers Cardiologist:  Oneil Parchment, MD Cardiology APP:  Carlin Delon BROCKS, NP  Electrophysiologist:  OLE ONEIDA HOLTS, MD     Referring MD: [name], MD   No chief complaint on file.   History of Present Illness:    Paul [name] is a 78 y.o. male with a hx of COPD (under palliative care), atrial fibrillation (s/p Watchman procedure 08/02/2021), coronary artery disease, hypertension, DM2, mild bilateral carotid artery stenosis, chronic diastolic congestive heart failure, GERD, syncope.   01/26/2023 echo EF 65 to 70%, grade 1 DD, aortic valve sclerosis is present without stenosis 04/09/2022 monitor average heart rate 77 bpm, no episodes of atrial fibrillation, rare PACs and PVCs 08/02/2021 Watchman procedure with left atrial appendage 07/17/2021 coronary CTA calcium  score 1046, 77th percentile 04/03/2020 carotid duplex mild bilateral carotid artery stenosis  Initially referred to cardiology for atrial fibrillation and multivessel coronary atherosclerosis.  He has notoriously been intolerant of beta-blockers, and anticoagulation.  He has had several syncopal episodes related to beta-blockers.  Anticoagulation caused gross hematuria.  He was referred to Dr. Holts for a Watchman device and this was placed in August 2022 for PAF, as he was intolerant to anticoagulation and beta-blockers.  01/16/2023 to 01/19/2022 -admitted for COPD exacerbation, discharged with home O2  01/25/2023 to 01/28/2023 -admitted for A-fib RVR, he converted with diltiazem  infusion, plan to follow-up with EP as he has not tolerated anticoagulation or beta-blocker therapy  02/04/2023 to 02/07/2023-admitted for acute on chronic hypoxic respiratory failure, acute on chronic diastolic congestive heart failure.  He was again noted to be in A-fib RVR, started on digoxin  and  carvedilol  and had converted to normal sinus rhythm upon discharge.  Most recently evaluated by myself on 06/29/2024, doing stable, staying active and playing golf, SOB but at baseline.     Past Medical History:  Diagnosis Date   Anemia    Aortic atherosclerosis    Aspiration pneumonia due to inhalation of vomitus (HCC) 02/03/2022   Atrial fibrillation (HCC)    Back pain    COPD (chronic obstructive pulmonary disease) (HCC)    COPD exacerbation (HCC) 12/27/2021   COVID-19 12/24/2020   Diabetes mellitus without complication (HCC)    Dizziness 04/08/2022   Elevated PSA    GAD (generalized anxiety disorder)    GERD (gastroesophageal reflux disease)    High cholesterol    Hypertension    Lingular pneumonia 01/05/2021   See cxr 01/06/20 rx with zpak/ cefipime x 5 days and then developed purulent sputum again 01/12/21 so rx levcaquin 500 mg daily x 7 days then return for cxr before more    Lung nodule < 6cm on CT 05/29/2016   Osteoporosis    Oxygen  deficiency    Pneumonia    January 2022   Presence of Watchman left atrial appendage closure device 08/02/2021   s/p LAAO by Dr. Holts with a 20 mm Watchman FLX device   RSV bronchitis 01/27/2022   Vertebral compression fracture (HCC) 05/29/2019   T8 compression fracture noted on CT scan 05/28/2019 inpatient with chronic steroid dependent COPD - rx calcitonin nasal spray rx per PCP     Past Surgical History:  Procedure Laterality Date   CATARACT EXTRACTION Right    ELBOW SURGERY  07/2022   surgery on olecranon bursa. Dr. Alyse   IR [facility]  EA ADDL LEVEL THORACIC OR LUMBAR  11/28/2021   LEFT ATRIAL APPENDAGE OCCLUSION N/A 08/02/2021   Procedure: LEFT ATRIAL APPENDAGE OCCLUSION;  Surgeon: Wonda Sharper, MD;  Location: [facility] INVASIVE CV LAB;  Service: Cardiovascular;  Laterality: N/A;   SKIN SURGERY     shoulders and chest   TEE WITHOUT CARDIOVERSION N/A 08/02/2021   Procedure: TRANSESOPHAGEAL ECHOCARDIOGRAM (TEE);  Surgeon: Wonda Sharper, MD;  Location: [facility] INVASIVE CV LAB;  Service: Cardiovascular;  Laterality: N/A;   TEE WITHOUT CARDIOVERSION N/A 09/13/2021   Procedure: TRANSESOPHAGEAL ECHOCARDIOGRAM (TEE);  Surgeon: Francyne Headland, MD;  Location: [facility] ENDOSCOPY;  Service: Cardiovascular;  Laterality: N/A;    Current Medications: No outpatient medications have been marked as taking for the 11/08/24 encounter (Appointment) with Carlin Delon BROCKS, NP.   Current Facility-Administered Medications for the 11/08/24 encounter (Appointment) with Carlin Delon BROCKS, NP  Medication   denosumab  (PROLIA ) injection 60 mg     Allergies:   Tape, Daliresp  [roflumilast ], Levaquin  [levofloxacin ], and Alendronate   Social History   Socioeconomic History   Marital status: Married    Spouse name: Not on file   Number of children: 3   Years of education: Not on file   Highest education level: 10th grade  Occupational History   Occupation: retired    Comment: truck hospital doctor  Tobacco Use   Smoking status: Former    Current packs/day: 0.00    Average packs/day: 2.0 packs/day for 52.0 years (104.0 ttl pk-yrs)    Types: Cigarettes    Start date: 02/03/1957    Quit date: 02/03/2009    Years since quitting: 15.7   Smokeless tobacco: Never   Tobacco comments:    Counseled to remain smoke free  Vaping Use   Vaping status: Never Used  Substance and Sexual Activity   Alcohol use: Not Currently    Alcohol/week: 0.0 standard drinks of alcohol    Comment: occ   Drug use: No   Sexual activity: Not on file  Other Topics Concern   Not on file  Social History Narrative   Originally from [place]. Previously worked driving a surveyor, minerals. No pets currently. No mold exposure. 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       wears sunscreen, brushes and flosses daily, see's dentist bi-annually, has smoke/carbon monoxide detectors, wears a seatbelt and practices gun safety       Social Drivers of Health   Financial Resource Strain: Low Risk  (07/19/2023)   Overall Financial Resource Strain (CARDIA)    Difficulty of Paying Living Expenses: Not hard at all  Food Insecurity: No Food Insecurity (10/21/2024)   Hunger Vital Sign    Worried About Running Out of Food in the Last Year: Never true    Ran Out of Food in the Last Year: Never true  Transportation Needs: No Transportation Needs (10/21/2024)   PRAPARE - Administrator, Civil Service (Medical): No    Lack of Transportation (Non-Medical): No  Physical Activity: Insufficiently Active (07/19/2023)   Exercise Vital Sign    Days of Exercise per Week: 3 days    Minutes of Exercise per Session: 30 min  Stress: No Stress Concern Present (07/19/2023)   [employer] of Occupational Health - Occupational Stress Questionnaire    Feeling of Stress : Not at all  Social Connections: Socially Integrated (10/21/2024)   Social Connection and Isolation Panel    Frequency of Communication with  Friends and Family: More than three times a week    Frequency of Social Gatherings with Friends and Family: More than three times a week    Attends Religious Services: More than 4 times per year    Active Member of [employer] or Organizations: Yes    Attends Engineer, Structural: More than 4 times per year    Marital Status: Married     Family History: The patient's family history includes Cancer in his paternal uncle; Heart disease in his brother, father, and mother.  ROS:   Please see the history of present illness.    All other systems reviewed and are negative.  EKGs/Labs/Other Studies Reviewed:    The following studies were reviewed today:  EKG:  EKG is not ordered today.    Recent Labs: 09/08/2024: TSH 0.495 10/14/2024: Pro Brain Natriuretic Peptide 262.0 10/21/2024: Magnesium  2.2 10/28/2024: ALT 67; BUN 21; Creatinine, Ser 1.00; Hemoglobin 12.9; Platelets 290; Potassium 4.1; Sodium 142  Recent  Lipid Panel    Component Value Date/Time   CHOL 158 07/05/2024 0802   TRIG 199 (H) 07/05/2024 0802   HDL 57 07/05/2024 0802   CHOLHDL 2.8 07/05/2024 0802   LDLCALC 68 07/05/2024 0802     Risk Assessment/Calculations:    CHA2DS2-VASc Score =     This indicates a  % annual risk of stroke. The patient's score is based upon:     {This patient has a significant risk of stroke if diagnosed with atrial fibrillation.  Please consider VKA or DOAC agent for anticoagulation if the bleeding risk is acceptable.   You can also use the SmartPhrase .[email or web address] for documentation.   :[id number]}  No BP recorded.  {Refresh Note OR Click here to enter BP  :1}***         Physical Exam:    VS:  There were no vitals taken for this visit.    Wt Readings from Last 3 Encounters:  10/28/24 155 lb (70.3 kg)  10/28/24 157 lb 6.4 oz (71.4 kg)  10/22/24 154 lb 8.7 oz (70.1 kg)     GEN:  Ill appearing, no acute distress HEENT: Normal NECK: No JVD; No carotid bruits LYMPHATICS: No lymphadenopathy CARDIAC: RRR, no murmurs, rubs, gallops RESPIRATORY:  Clear to auscultation but diminished, without rales, wheezing or rhonchi  ABDOMEN: Soft, non-tender, non-distended MUSCULOSKELETAL:  No edema; No deformity  SKIN: Warm and dry, multiple skin tea NEUROLOGIC:  Alert and oriented x 3 PSYCHIATRIC:  Normal affect   ASSESSMENT:    No diagnosis found.   PLAN:    In order of problems listed above:  Paroxysmal atrial fibrillation -he is maintaining sinus rhythm, regular rate and s/p Watchman in 2022 was noted to be in correct placement with no leaking on TEE in September 2022.  Traditionally has been intolerant of beta-blocker secondary to syncopal episodes and falls.  Continue digoxin , he had already taken his morning dose so we are unable to check his digoxin  level today but he is not exhibiting any signs of toxicity.  Chronic diastolic heart failure -echo on 01/26/2023 shows EF 65 to 70%, grade 1 DD.   Euvolemic, continue Farxiga  10 mg daily,  Lasix  40 mg as needed, has not been able to tolerate GDMT secondary to dizziness/orthostasis and syncope. Coronary artery calcification - Stable with no anginal symptoms. No indication for ischemic evaluation.  Continue aspirin . Heart healthy diet and regular cardiovascular exercise encouraged.   Hypertension -blood pressure today slightly elevated 138/70 however has been bothered by  episodes of dizziness and syncope in the past.  For now we will not make any medication changes.  Continue Norvasc  2.5 mg daily, if his blood pressure were to get much higher we could increase his Norvasc  to 5 mg daily. COPD - at baseline, follows with Dr. Darlean.    Disposition-follow-up in 6 months.   Medication Adjustments/Labs and Tests Ordered: Current medicines are reviewed at length with the patient today.  Concerns regarding medicines are outlined above.  No orders of the defined types were placed in this encounter.  No orders of the defined types were placed in this encounter.   There are no Patient Instructions on file for this visit.   Signed, Delon JAYSON Hoover, NP  11/04/2024 1:07 PM    [facility]

## 2024-11-04 NOTE — Progress Notes (Signed)
 Patient is in office today for a nurse visit for Prolia  Injection. Patient Injection was given in the  Right deltoid. Patient tolerated injection well.   Prolia  60 mg/mL injection Lot: [id number] Exp: 05.31.2028

## 2024-11-05 ENCOUNTER — Inpatient Hospital Stay (HOSPITAL_BASED_OUTPATIENT_CLINIC_OR_DEPARTMENT_OTHER): Admission: EM | Admit: 2024-11-05 | Discharge: 2024-11-08 | DRG: 189 | Disposition: A

## 2024-11-05 ENCOUNTER — Emergency Department (HOSPITAL_BASED_OUTPATIENT_CLINIC_OR_DEPARTMENT_OTHER)

## 2024-11-05 ENCOUNTER — Ambulatory Visit: Payer: Self-pay | Admitting: Adult Health

## 2024-11-05 ENCOUNTER — Encounter (HOSPITAL_BASED_OUTPATIENT_CLINIC_OR_DEPARTMENT_OTHER): Payer: Self-pay

## 2024-11-05 ENCOUNTER — Other Ambulatory Visit: Payer: Self-pay

## 2024-11-05 ENCOUNTER — Telehealth: Payer: Self-pay | Admitting: *Deleted

## 2024-11-05 DIAGNOSIS — J449 Chronic obstructive pulmonary disease, unspecified: Secondary | ICD-10-CM

## 2024-11-05 DIAGNOSIS — Z7982 Long term (current) use of aspirin: Secondary | ICD-10-CM | POA: Diagnosis not present

## 2024-11-05 DIAGNOSIS — E1165 Type 2 diabetes mellitus with hyperglycemia: Secondary | ICD-10-CM | POA: Diagnosis present

## 2024-11-05 DIAGNOSIS — Z95818 Presence of other cardiac implants and grafts: Secondary | ICD-10-CM

## 2024-11-05 DIAGNOSIS — J9601 Acute respiratory failure with hypoxia: Principal | ICD-10-CM

## 2024-11-05 DIAGNOSIS — I480 Paroxysmal atrial fibrillation: Secondary | ICD-10-CM | POA: Diagnosis present

## 2024-11-05 DIAGNOSIS — F419 Anxiety disorder, unspecified: Secondary | ICD-10-CM | POA: Diagnosis present

## 2024-11-05 DIAGNOSIS — G4700 Insomnia, unspecified: Secondary | ICD-10-CM | POA: Diagnosis present

## 2024-11-05 DIAGNOSIS — I5032 Chronic diastolic (congestive) heart failure: Secondary | ICD-10-CM | POA: Diagnosis present

## 2024-11-05 DIAGNOSIS — E876 Hypokalemia: Secondary | ICD-10-CM | POA: Diagnosis present

## 2024-11-05 DIAGNOSIS — Z87891 Personal history of nicotine dependence: Secondary | ICD-10-CM | POA: Diagnosis not present

## 2024-11-05 DIAGNOSIS — M810 Age-related osteoporosis without current pathological fracture: Secondary | ICD-10-CM | POA: Diagnosis present

## 2024-11-05 DIAGNOSIS — Z8616 Personal history of COVID-19: Secondary | ICD-10-CM | POA: Diagnosis not present

## 2024-11-05 DIAGNOSIS — Z9981 Dependence on supplemental oxygen: Secondary | ICD-10-CM | POA: Diagnosis not present

## 2024-11-05 DIAGNOSIS — I110 Hypertensive heart disease with heart failure: Secondary | ICD-10-CM | POA: Diagnosis present

## 2024-11-05 DIAGNOSIS — Z8249 Family history of ischemic heart disease and other diseases of the circulatory system: Secondary | ICD-10-CM

## 2024-11-05 DIAGNOSIS — J9621 Acute and chronic respiratory failure with hypoxia: Secondary | ICD-10-CM | POA: Diagnosis present

## 2024-11-05 DIAGNOSIS — G4733 Obstructive sleep apnea (adult) (pediatric): Secondary | ICD-10-CM | POA: Diagnosis present

## 2024-11-05 DIAGNOSIS — R0602 Shortness of breath: Secondary | ICD-10-CM | POA: Diagnosis not present

## 2024-11-05 DIAGNOSIS — Z888 Allergy status to other drugs, medicaments and biological substances status: Secondary | ICD-10-CM

## 2024-11-05 DIAGNOSIS — Z7952 Long term (current) use of systemic steroids: Secondary | ICD-10-CM

## 2024-11-05 DIAGNOSIS — E039 Hypothyroidism, unspecified: Secondary | ICD-10-CM | POA: Diagnosis present

## 2024-11-05 DIAGNOSIS — Z1152 Encounter for screening for COVID-19: Secondary | ICD-10-CM

## 2024-11-05 DIAGNOSIS — J441 Chronic obstructive pulmonary disease with (acute) exacerbation: Secondary | ICD-10-CM | POA: Diagnosis present

## 2024-11-05 DIAGNOSIS — Z7984 Long term (current) use of oral hypoglycemic drugs: Secondary | ICD-10-CM

## 2024-11-05 DIAGNOSIS — E7800 Pure hypercholesterolemia, unspecified: Secondary | ICD-10-CM | POA: Diagnosis present

## 2024-11-05 DIAGNOSIS — Z7951 Long term (current) use of inhaled steroids: Secondary | ICD-10-CM | POA: Diagnosis not present

## 2024-11-05 DIAGNOSIS — J9622 Acute and chronic respiratory failure with hypercapnia: Secondary | ICD-10-CM | POA: Diagnosis present

## 2024-11-05 DIAGNOSIS — Z79890 Hormone replacement therapy (postmenopausal): Secondary | ICD-10-CM | POA: Diagnosis not present

## 2024-11-05 DIAGNOSIS — Z91048 Other nonmedicinal substance allergy status: Secondary | ICD-10-CM

## 2024-11-05 DIAGNOSIS — Z79899 Other long term (current) drug therapy: Secondary | ICD-10-CM | POA: Diagnosis not present

## 2024-11-05 DIAGNOSIS — Z881 Allergy status to other antibiotic agents status: Secondary | ICD-10-CM

## 2024-11-05 LAB — BASIC METABOLIC PANEL WITH GFR
Anion gap: 12 (ref 5–15)
BUN: 16 mg/dL (ref 8–23)
CO2: 36 mmol/L — ABNORMAL HIGH (ref 22–32)
Calcium: 9.5 mg/dL (ref 8.9–10.3)
Chloride: 95 mmol/L — ABNORMAL LOW (ref 98–111)
Creatinine, Ser: 0.89 mg/dL (ref 0.61–1.24)
GFR, Estimated: >60 mL/min (ref >60)
Glucose, Bld: 164 mg/dL — ABNORMAL HIGH (ref 70–99)
Potassium: 3.7 mmol/L (ref 3.5–5.1)
Sodium: 143 mmol/L (ref 135–145)

## 2024-11-05 LAB — RESP PANEL BY RT-PCR (RSV, FLU A&B, COVID)  RVPGX2
Influenza A by PCR: NEGATIVE
Influenza B by PCR: NEGATIVE
Resp Syncytial Virus by PCR: NEGATIVE
SARS Coronavirus 2 by RT PCR: NEGATIVE

## 2024-11-05 LAB — CBC
HCT: 42.7 % (ref 39.0–52.0)
Hemoglobin: 12.8 g/dL — ABNORMAL LOW (ref 13.0–17.0)
MCH: 24.2 pg — ABNORMAL LOW (ref 26.0–34.0)
MCHC: 30 g/dL (ref 30.0–36.0)
MCV: 80.7 fL (ref 80.0–100.0)
Platelets: 264 K/uL (ref 150–400)
RBC: 5.29 MIL/uL (ref 4.22–5.81)
RDW: 17.2 % — ABNORMAL HIGH (ref 11.5–15.5)
WBC: 15 K/uL — ABNORMAL HIGH (ref 4.0–10.5)
nRBC: 0 % (ref 0.0–0.2)

## 2024-11-05 LAB — TROPONIN T, HIGH SENSITIVITY
Troponin T High Sensitivity: 20 ng/L — ABNORMAL HIGH (ref 0–19)
Troponin T High Sensitivity: <15 ng/L (ref 0–19)

## 2024-11-05 LAB — PHOSPHORUS: Phosphorus: 2.2 mg/dL — ABNORMAL LOW (ref 2.5–4.6)

## 2024-11-05 LAB — MAGNESIUM: Magnesium: 2.4 mg/dL (ref 1.7–2.4)

## 2024-11-05 LAB — GLUCOSE, CAPILLARY: Glucose-Capillary: 148 mg/dL — ABNORMAL HIGH (ref 70–99)

## 2024-11-05 LAB — PRO BRAIN NATRIURETIC PEPTIDE: Pro Brain Natriuretic Peptide: 181 pg/mL (ref <300.0)

## 2024-11-05 MED ORDER — IPRATROPIUM-ALBUTEROL 0.5-2.5 (3) MG/3ML IN SOLN
3.0000 mL | Freq: Once | RESPIRATORY_TRACT | Status: AC
  Administered 2024-11-05: 3 mL via RESPIRATORY_TRACT
  Filled 2024-11-05: qty 3

## 2024-11-05 MED ORDER — VITAMIN D 25 MCG (1000 UNIT) PO TABS
2000.0000 [IU] | ORAL_TABLET | Freq: Every day | ORAL | Status: DC
  Administered 2024-11-06 – 2024-11-08 (×3): 2000 [IU] via ORAL
  Filled 2024-11-05 (×3): qty 2

## 2024-11-05 MED ORDER — IPRATROPIUM-ALBUTEROL 0.5-2.5 (3) MG/3ML IN SOLN: 3.0000 mL | Freq: Once | RESPIRATORY_TRACT | Status: AC

## 2024-11-05 MED ORDER — INSULIN ASPART 100 UNIT/ML IJ SOLN
0.0000 [IU] | Freq: Three times a day (TID) | INTRAMUSCULAR | Status: DC
  Administered 2024-11-06: 1 [IU] via SUBCUTANEOUS
  Administered 2024-11-06: 3 [IU] via SUBCUTANEOUS
  Administered 2024-11-07: 1 [IU] via SUBCUTANEOUS
  Administered 2024-11-07: 2 [IU] via SUBCUTANEOUS
  Administered 2024-11-08: 1 [IU] via SUBCUTANEOUS
  Filled 2024-11-05: qty 3
  Filled 2024-11-05: qty 1
  Filled 2024-11-05 (×2): qty 2

## 2024-11-05 MED ORDER — ENOXAPARIN SODIUM 40 MG/0.4ML IJ SOSY
40.0000 mg | PREFILLED_SYRINGE | INTRAMUSCULAR | Status: DC
  Administered 2024-11-05 – 2024-11-07 (×3): 40 mg via SUBCUTANEOUS
  Filled 2024-11-05 (×3): qty 0.4

## 2024-11-05 MED ORDER — FERROUS SULFATE 325 (65 FE) MG PO TABS
325.0000 mg | ORAL_TABLET | Freq: Every day | ORAL | Status: DC
  Administered 2024-11-06 – 2024-11-08 (×3): 325 mg via ORAL
  Filled 2024-11-05 (×4): qty 1

## 2024-11-05 MED ORDER — SERTRALINE HCL 100 MG PO TABS
100.0000 mg | ORAL_TABLET | Freq: Every day | ORAL | Status: DC
  Administered 2024-11-05 – 2024-11-07 (×3): 100 mg via ORAL
  Filled 2024-11-05 (×3): qty 1

## 2024-11-05 MED ORDER — IPRATROPIUM-ALBUTEROL 0.5-2.5 (3) MG/3ML IN SOLN
RESPIRATORY_TRACT | Status: AC
Start: 2024-11-05 — End: 2024-11-05
  Administered 2024-11-05: 3 mL via RESPIRATORY_TRACT
  Filled 2024-11-05: qty 3

## 2024-11-05 MED ORDER — BUDESONIDE 0.5 MG/2ML IN SUSP
0.5000 mg | Freq: Two times a day (BID) | RESPIRATORY_TRACT | Status: DC
  Administered 2024-11-05 – 2024-11-08 (×6): 0.5 mg via RESPIRATORY_TRACT
  Filled 2024-11-05 (×6): qty 2

## 2024-11-05 MED ORDER — FUROSEMIDE 40 MG PO TABS
40.0000 mg | ORAL_TABLET | Freq: Every day | ORAL | Status: DC
  Administered 2024-11-06 – 2024-11-08 (×3): 40 mg via ORAL
  Filled 2024-11-05 (×3): qty 1

## 2024-11-05 MED ORDER — ACETAMINOPHEN 325 MG PO TABS: 650.0000 mg | ORAL_TABLET | Freq: Four times a day (QID) | ORAL | Status: DC | PRN

## 2024-11-05 MED ORDER — INSULIN ASPART 100 UNIT/ML IJ SOLN: 0.0000 [IU] | Freq: Every day | INTRAMUSCULAR | Status: DC

## 2024-11-05 MED ORDER — DAPAGLIFLOZIN PROPANEDIOL 10 MG PO TABS
10.0000 mg | ORAL_TABLET | Freq: Every day | ORAL | Status: DC
  Administered 2024-11-06 – 2024-11-08 (×3): 10 mg via ORAL
  Filled 2024-11-05 (×3): qty 1

## 2024-11-05 MED ORDER — IPRATROPIUM-ALBUTEROL 0.5-2.5 (3) MG/3ML IN SOLN: 3.0000 mL | RESPIRATORY_TRACT | Status: DC | PRN

## 2024-11-05 MED ORDER — ACETAMINOPHEN 650 MG RE SUPP: 650.0000 mg | Freq: Four times a day (QID) | RECTAL | Status: DC | PRN

## 2024-11-05 MED ORDER — MAGNESIUM SULFATE 50 % IJ SOLN
1.0000 g | Freq: Once | INTRAMUSCULAR | Status: AC
  Administered 2024-11-05: 1 g via INTRAVENOUS
  Filled 2024-11-05: qty 2

## 2024-11-05 MED ORDER — POTASSIUM CHLORIDE CRYS ER 20 MEQ PO TBCR
40.0000 meq | EXTENDED_RELEASE_TABLET | Freq: Once | ORAL | Status: AC
  Administered 2024-11-05: 40 meq via ORAL
  Filled 2024-11-05: qty 2

## 2024-11-05 MED ORDER — PANTOPRAZOLE SODIUM 40 MG PO TBEC
40.0000 mg | DELAYED_RELEASE_TABLET | Freq: Two times a day (BID) | ORAL | Status: DC
  Administered 2024-11-05 – 2024-11-08 (×6): 40 mg via ORAL
  Filled 2024-11-05 (×6): qty 1

## 2024-11-05 MED ORDER — AMLODIPINE BESYLATE 10 MG PO TABS
5.0000 mg | ORAL_TABLET | Freq: Every day | ORAL | Status: DC
  Administered 2024-11-05 – 2024-11-08 (×4): 5 mg via ORAL
  Filled 2024-11-05 (×4): qty 1

## 2024-11-05 MED ORDER — IPRATROPIUM-ALBUTEROL 0.5-2.5 (3) MG/3ML IN SOLN
3.0000 mL | Freq: Four times a day (QID) | RESPIRATORY_TRACT | Status: DC
  Administered 2024-11-05 – 2024-11-08 (×12): 3 mL via RESPIRATORY_TRACT
  Filled 2024-11-05 (×12): qty 3

## 2024-11-05 MED ORDER — MELATONIN 3 MG PO TABS
9.0000 mg | ORAL_TABLET | Freq: Every day | ORAL | Status: DC
  Administered 2024-11-05 – 2024-11-07 (×3): 9 mg via ORAL
  Filled 2024-11-05 (×3): qty 3

## 2024-11-05 MED ORDER — LEVOTHYROXINE SODIUM 25 MCG PO TABS
25.0000 ug | ORAL_TABLET | Freq: Every day | ORAL | Status: DC
  Administered 2024-11-06 – 2024-11-08 (×3): 25 ug via ORAL
  Filled 2024-11-05 (×3): qty 1

## 2024-11-05 MED ORDER — METHYLPREDNISOLONE SODIUM SUCC 40 MG IJ SOLR
40.0000 mg | Freq: Two times a day (BID) | INTRAMUSCULAR | Status: DC
  Administered 2024-11-05 – 2024-11-08 (×6): 40 mg via INTRAVENOUS
  Filled 2024-11-05 (×6): qty 1

## 2024-11-05 MED ORDER — METHYLPREDNISOLONE SODIUM SUCC 125 MG IJ SOLR
125.0000 mg | Freq: Once | INTRAMUSCULAR | Status: AC
  Administered 2024-11-05: 125 mg via INTRAVENOUS
  Filled 2024-11-05: qty 2

## 2024-11-05 MED ORDER — ALPRAZOLAM 0.5 MG PO TABS
0.5000 mg | ORAL_TABLET | Freq: Two times a day (BID) | ORAL | Status: DC | PRN
  Administered 2024-11-05 – 2024-11-07 (×3): 0.5 mg via ORAL
  Filled 2024-11-05 (×3): qty 1

## 2024-11-05 MED ORDER — ATORVASTATIN CALCIUM 40 MG PO TABS
40.0000 mg | ORAL_TABLET | Freq: Every day | ORAL | Status: DC
  Administered 2024-11-05 – 2024-11-07 (×3): 40 mg via ORAL
  Filled 2024-11-05 (×3): qty 1

## 2024-11-05 MED ORDER — IPRATROPIUM-ALBUTEROL 0.5-2.5 (3) MG/3ML IN SOLN
3.0000 mL | RESPIRATORY_TRACT | Status: DC
  Administered 2024-11-05: 3 mL via RESPIRATORY_TRACT
  Filled 2024-11-05: qty 3

## 2024-11-05 MED ORDER — ASPIRIN 81 MG PO TBEC
81.0000 mg | DELAYED_RELEASE_TABLET | Freq: Every day | ORAL | Status: DC
  Administered 2024-11-06 – 2024-11-08 (×3): 81 mg via ORAL
  Filled 2024-11-05 (×3): qty 1

## 2024-11-05 NOTE — Plan of Care (Signed)
  Problem: Education: Goal: Knowledge of General Education information will improve Description: Including pain rating scale, medication(s)/side effects and non-pharmacologic comfort measures Outcome: Progressing   Problem: Health Behavior/Discharge Planning: Goal: Ability to manage health-related needs will improve Outcome: Progressing   Problem: Clinical Measurements: Goal: Will remain free from infection Outcome: Progressing   Problem: Activity: Goal: Risk for activity intolerance will decrease Outcome: Progressing   Problem: Nutrition: Goal: Adequate nutrition will be maintained Outcome: Progressing   Problem: Coping: Goal: Level of anxiety will decrease Outcome: Progressing   Problem: Elimination: Goal: Will not experience complications related to bowel motility Outcome: Progressing Goal: Will not experience complications related to urinary retention Outcome: Progressing   Problem: Pain Managment: Goal: General experience of comfort will improve and/or be controlled Outcome: Progressing   Problem: Safety: Goal: Ability to remain free from injury will improve Outcome: Progressing   Problem: Skin Integrity: Goal: Risk for impaired skin integrity will decrease Outcome: Progressing   Problem: Clinical Measurements: Goal: Ability to maintain clinical measurements within normal limits will improve Outcome: Not Progressing Goal: Respiratory complications will improve Outcome: Not Progressing

## 2024-11-05 NOTE — ED Notes (Signed)
 Called lab for add ons, rescheduled Trop for collection.

## 2024-11-05 NOTE — Progress Notes (Signed)
   11/05/24 2353  BiPAP/CPAP/SIPAP  BiPAP/CPAP/SIPAP Pt Type Adult  Reason BIPAP/CPAP not in use Non-compliant

## 2024-11-05 NOTE — ED Triage Notes (Signed)
 Pt presents with complaints of SOB and chest tightness X yesterday. Reports O2 at 74% on 3L at home. Hx of COPD. Denies N/V/D  Pt 80% on 3L [place]. RT at bedside.

## 2024-11-05 NOTE — ED Notes (Signed)
 Pt came in for recurrent SOB due to COPD.

## 2024-11-05 NOTE — Telephone Encounter (Signed)
 Spoke with wife

## 2024-11-05 NOTE — Telephone Encounter (Signed)
 Patient has been admitted to hospital per wife no questions in regards to new medication start.  Copied from CRM [id number]. Topic: Clinical - Medication Question >> Nov 04, 2024 12:09 PM Rilla B wrote: Reason for CRM: Patient's wife ([name]) is calling regarding new medication. States they have questions before he starts them. He is having a rough day today. Denies nurse triage, just wants to talk to [facility]. Please call patient [phone].

## 2024-11-05 NOTE — ED Provider Notes (Signed)
 [place] [facility] Provider Note   CSN: [id number] Arrival date & time: 11/05/24  1027     Patient presents with: Shortness of Breath   Paul [name] is a 78 y.o. male.   78 year old male presents for evaluation of shortness of breath.  He is on 2 L nasal cannula at baseline for COPD.  States last few days he has been feeling more short of breath but it got really bad today.  States he turned his oxygen  up to 3 L and oxygen  saturations were still in the 70s.  Has been using his breathing treatments without much relief.  He admits to cough but denies any fevers.  Denies any chest pain or any other symptoms or concerns at this time.   Shortness of Breath Associated symptoms: cough   Associated symptoms: no abdominal pain, no chest pain, no ear pain, no fever, no rash, no sore throat and no vomiting        Prior to Admission medications   Medication Sig Start Date End Date Taking? Authorizing Provider  albuterol  (PROVENTIL ) (2.5 MG/3ML) 0.083% nebulizer solution Take 3 mLs (2.5 mg total) by nebulization every 6 (six) hours as needed for shortness of breath or wheezing. 10/23/22  Yes Parrett, Tammy S, NP  albuterol  (VENTOLIN  HFA) 108 (90 Base) MCG/ACT inhaler Inhale 1-2 puffs into the lungs every 6 (six) hours as needed for wheezing or shortness of breath. 10/15/24  Yes Sirivol, Mamatha, MD  ALPRAZolam  (XANAX ) 0.5 MG tablet Take 1 tablet (0.5 mg total) by mouth 2 (two) times daily as needed for anxiety. F41.1 09/24/24  Yes Nicholaus Credit, PA-C  amLODipine  (NORVASC ) 5 MG tablet Take 1 tablet (5 mg total) by mouth daily. 09/12/24  Yes Fausto Sor A, DO  arformoterol  (BROVANA ) 15 MCG/2ML NEBU Take 2 mLs (15 mcg total) by nebulization 2 (two) times daily. 10/28/24  Yes Parrett, Tammy S, NP  Ascorbic Acid  (VITAMIN C ) 1000 MG tablet Take 1,000 mg by mouth daily.   Yes [provider]  aspirin  EC 81 MG tablet Take 1 tablet (81 mg total) by mouth  daily. Swallow whole. 01/28/22  Yes McDaniel, Jill D, NP  atorvastatin  (LIPITOR) 40 MG tablet TAKE 1 TABLET BY MOUTH EVERYDAY AT BEDTIME 07/22/24  Yes Cox, Kirsten, MD  CALCIUM  PO Take 1 tablet by mouth daily.   Yes [provider]  Cholecalciferol  (VITAMIN D3) 50 MCG (2000 UT) TABS Take 2,000 Units by mouth in the morning.   Yes [provider]  dapagliflozin  propanediol (FARXIGA ) 10 MG TABS tablet Take 1 tablet (10 mg total) by mouth daily. 07/06/24  Yes Cox, Kirsten, MD  denosumab  (PROLIA ) 60 MG/ML SOSY injection Inject 60 mg into the skin every 6 (six) months. 03/11/23  Yes Cox, Kirsten, MD  digoxin  (LANOXIN ) 0.125 MG tablet TAKE 1 TABLET BY MOUTH DAILY 04/26/24  Yes Jeffrie Oneil BROCKS, MD  ferrous sulfate  325 (65 FE) MG tablet Take 325 mg by mouth 2 (two) times daily with a meal. Patient taking differently: Take 325 mg by mouth daily with breakfast.   Yes [provider]  furosemide  (LASIX ) 40 MG tablet Take 1 tablet (40 mg total) by mouth daily. 09/12/24  Yes Fausto Sor A, DO  Guaifenesin  (MUCINEX  MAXIMUM STRENGTH) 1200 MG TB12 Take 1,200 mg by mouth daily.   Yes [provider]  levothyroxine  (SYNTHROID ) 25 MCG tablet Take 1 tablet (25 mcg total) by mouth daily. 05/21/24  Yes CoxAbigail, MD  Melatonin 12 MG TABS Take 12 mg by mouth at bedtime.   Yes [provider]  Multiple Vitamin (MULTIVITAMIN WITH MINERALS) TABS tablet Take 1 tablet by mouth daily with breakfast.   Yes [provider]  Omega-3 Fatty Acids  (FISH OIL) 1200 MG CAPS Take 1,200 mg by mouth at bedtime.   Yes [provider]  OXYGEN  Inhale 2-3 L/min into the lungs continuous.   Yes [provider]  pantoprazole  (PROTONIX ) 40 MG tablet Take 1 tablet (40 mg total) by mouth 2 (two) times daily. 05/20/24  Yes Parrett, Tammy S, NP  Probiotic Product (PROBIOTIC DAILY) CAPS Take 1 capsule by mouth in the morning.   Yes [provider]  sertraline  (ZOLOFT ) 100 MG  tablet Take 1 tablet (100 mg total) by mouth daily. 10/28/24  Yes Cox, Kirsten, MD  Accu-Chek Softclix Lancets lancets Check fasting sugar once daily. 08/05/23   CoxAbigail, MD  budesonide  (PULMICORT ) 0.25 MG/2ML nebulizer solution Take 2 mLs (0.25 mg total) by nebulization 2 (two) times daily. 10/28/24   Parrett, Madelin RAMAN, NP  Glucose Blood (BLOOD GLUCOSE TEST STRIPS) STRP Use to test blood sugar up to twice daily. May substitute to any manufacturer covered by patient's insurance. E11.0 07/14/23   [name], MD  Lancets Misc. MISC Use to test blood sugar up to twice daily. May substitute to any manufacturer covered by patient's insurance. E11.0 07/14/23   CoxAbigail, MD  NUCALA  100 MG/ML SOSY Inject 100 mg into the skin every 30 (thirty) days. Patient not taking: Reported on 11/05/2024    [provider]  revefenacin  (YUPELRI ) 175 MCG/3ML nebulizer solution Take 3 mLs (175 mcg total) by nebulization daily. 10/28/24   Parrett, Madelin RAMAN, NP    Allergies: Tape, Daliresp  [roflumilast ], Levaquin  [levofloxacin ], and Alendronate    Review of Systems  Constitutional:  Negative for chills and fever.  HENT:  Negative for ear pain and sore throat.   Eyes:  Negative for pain and visual disturbance.  Respiratory:  Positive for cough and shortness of breath.   Cardiovascular:  Negative for chest pain and palpitations.  Gastrointestinal:  Negative for abdominal pain and vomiting.  Genitourinary:  Negative for dysuria and hematuria.  Musculoskeletal:  Negative for arthralgias and back pain.  Skin:  Negative for color change and rash.  Neurological:  Negative for seizures and syncope.  All other systems reviewed and are negative.   Updated Vital Signs BP (!) 141/59   Pulse 98   Temp 97.6 F (36.4 C)   Resp 20   SpO2 98%   Physical Exam Vitals and nursing note reviewed.  Constitutional:      General: He is not in acute distress.    Appearance: He is well-developed.  HENT:     Head:  Normocephalic and atraumatic.  Eyes:     Conjunctiva/sclera: Conjunctivae normal.  Cardiovascular:     Rate and Rhythm: Normal rate and regular rhythm.     Heart sounds: No murmur heard. Pulmonary:     Effort: Tachypnea present. No respiratory distress.     Breath sounds: Decreased breath sounds and wheezing present.  Abdominal:     Palpations: Abdomen is soft.     Tenderness: There is no abdominal tenderness.  Musculoskeletal:        General: No swelling.     Cervical back: Neck supple.  Skin:    General: Skin is warm and dry.     Capillary Refill: Capillary refill takes less than 2 seconds.  Neurological:     Mental Status: He is alert.  Psychiatric:        Mood and Affect: Mood normal.     (all labs ordered are listed, but only abnormal results are displayed) Labs Reviewed  BASIC METABOLIC PANEL WITH GFR - Abnormal; Notable for the following components:      Result Value   Chloride 95 (*)    CO2 36 (*)    Glucose, Bld 164 (*)    All other components within normal limits  CBC - Abnormal; Notable for the following components:   WBC 15.0 (*)    Hemoglobin 12.8 (*)    MCH 24.2 (*)    RDW 17.2 (*)    All other components within normal limits  PHOSPHORUS - Abnormal; Notable for the following components:   Phosphorus 2.2 (*)    All other components within normal limits  TROPONIN T, HIGH SENSITIVITY - Abnormal; Notable for the following components:   Troponin T High Sensitivity 20 (*)    All other components within normal limits  RESP PANEL BY RT-PCR (RSV, FLU A&B, COVID)  [id number]  PRO BRAIN NATRIURETIC PEPTIDE  MAGNESIUM   TROPONIN T, HIGH SENSITIVITY    EKG: EKG Interpretation Date/Time:  Friday November 05 2024 10:38:28 EST Ventricular Rate:  91 PR Interval:  123 QRS Duration:  89 QT Interval:  347 QTC Calculation: 427 R Axis:   62  Text Interpretation: Sinus rhythm Minimal ST depression, inferior leads Compared with prior EKG from 10/20/2024 Confirmed by  Gennaro Bouchard ([id number]) on 11/05/2024 10:52:11 AM  Radiology: [facility] Chest 2 View Result Date: 11/05/2024 EXAM: 2 VIEW(S) XRAY OF THE CHEST 11/05/2024 11:24:00 AM COMPARISON: 16 days ago. CLINICAL HISTORY: SOb. FINDINGS: LUNGS AND PLEURA: No focal pulmonary opacity. No pleural effusion. No pneumothorax. HEART AND MEDIASTINUM: No acute abnormality of the cardiac and mediastinal silhouettes. BONES AND SOFT TISSUES: No acute osseous abnormality. IMPRESSION: 1. No acute cardiopulmonary disease. Electronically signed by: Lynwood Seip MD 11/05/2024 11:40 AM EST RP Workstation: [id number]     .Critical Care  Performed by: Gennaro Bouchard CROME, DO Authorized by: Gennaro Bouchard CROME, DO   Critical care provider statement:    Critical care time (minutes):  30   Critical care time was exclusive of:  Separately billable procedures and treating other patients and teaching time   Critical care was necessary to treat or prevent imminent or life-threatening deterioration of the following conditions:  Respiratory failure   Critical care was time spent personally by me on the following activities:  Development of treatment plan with patient or surrogate, discussions with consultants, evaluation of patient's response to treatment, examination of patient, ordering and review of laboratory studies, ordering and review of radiographic studies, ordering and performing treatments and interventions, pulse oximetry, re-evaluation of patient's condition, review of old charts and obtaining history from patient or surrogate   Care discussed with: admitting provider      Medications Ordered in the ED  ipratropium-albuterol  (DUONEB) 0.5-2.5 (3) MG/3ML nebulizer solution 3 mL (3 mLs Nebulization Given 11/05/24 1048)  ipratropium-albuterol  (DUONEB) 0.5-2.5 (3) MG/3ML nebulizer solution 3 mL (3 mLs Nebulization Given 11/05/24 1056)  methylPREDNISolone  sodium succinate  (SOLU-MEDROL ) 125 mg/2 mL injection 125 mg (125 mg Intravenous Given  11/05/24 1124)  ipratropium-albuterol  (DUONEB) 0.5-2.5 (3) MG/3ML nebulizer solution 3 mL (3 mLs Nebulization Given 11/05/24 1212)  magnesium  sulfate (IV Push/IM) injection 1 g (1 g Intravenous Given 11/05/24 1307)  potassium chloride  SA (KLOR-CON  M) CR tablet 40 mEq (40 mEq Oral  Given 11/05/24 1303)                                    Medical Decision Making Cardiac monitor interpretation: Sinus rhythm, no ectopy  Patient here for COPD exacerbation causing hypoxia.  He is on 2 L at baseline and was turned up to 4 L as he was 70% on 2 L with ambulation.  Given multiple breathing treatments in the ER with some mild improvement in his symptoms.  Tachypnea did slightly improve but patient is still wheezing.  He was given steroids and magnesium  as well.  Workup largely negative otherwise.  I discussed patient's case with Dr. Celinda, hospitalist and patient will be admitted for further workup and management.  Patient and family bedside agreeable with the plan  Problems Addressed: Acute respiratory failure with hypoxia [facility]): acute illness or injury that poses a threat to life or bodily functions COPD exacerbation (HCC): acute illness or injury that poses a threat to life or bodily functions Hypokalemia: acute illness or injury  Amount and/or Complexity of Data Reviewed External Data Reviewed: notes.    Details: Prior hospital records reviewed and patient has been seen in the last few months multiple times for COPD exacerbation Labs: ordered. Decision-making details documented in ED Course.    Details: Ordered and reviewed by me and patient has mild hypokalemia, this will be replaced Radiology: ordered and independent interpretation performed. Decision-making details documented in ED Course.    Details: Ordered and interpreted by me independently radiology chest Chest x-ray: Shows no acute abnormality ECG/medicine tests: ordered and independent interpretation performed. Decision-making details  documented in ED Course.    Details: EKG ordered and interpreted by me in the absence of cardiology and shows sinus rhythm, no STEMI or significant change when compared to prior EKG Discussion of management or test interpretation with external provider(s): Dr. Marthe spoke with him on the phone regarding patient's case and he will admit patient for further workup and management  Risk OTC drugs. Prescription drug management. Drug therapy requiring intensive monitoring for toxicity. Decision regarding hospitalization. Risk Details: CRITICAL CARE Performed by: Duwaine LITTIE Fusi   Total critical care time: 30 minutes  Critical care time was exclusive of separately billable procedures and treating other patients.  Critical care was necessary to treat or prevent imminent or life-threatening deterioration.  Critical care was time spent personally by me on the following activities: development of treatment plan with patient and/or surrogate as well as nursing, discussions with consultants, evaluation of patient's response to treatment, examination of patient, obtaining history from patient or surrogate, ordering and performing treatments and interventions, ordering and review of laboratory studies, ordering and review of radiographic studies, pulse oximetry and re-evaluation of patient's condition.   Critical Care Total time providing critical care: 30 minutes    Final diagnoses:  Acute respiratory failure with hypoxia (HCC)  COPD exacerbation (HCC)  Hypokalemia    ED Discharge Orders     None          Fusi Duwaine LITTIE, DO 11/05/24 1442

## 2024-11-05 NOTE — H&P (Signed)
 History and Physical    Paul [name] FMW:[id number] DOB: 09-22-1946 DOA: 11/05/2024  PCP: [name], MD  Patient coming from: Home.  Lives with wife.  Independently ambulates at baseline.  Chief Complaint: COPD  HPI:  Paul [name] is 78 y.o. 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  Patient reports shortness of breath, chest tightness and  cough for 2 days.  Cough is productive with thick whitish and greenish phlegm.  Still taking prednisone  from his recent hospitalization.  He was at 20 mg daily but increased to 30 mg daily when his breathing gotten worse.  He also reports desaturation to 76% on 3 L at home.  He denies chest pain, hemoptysis, fever, runny nose, nasal congestion, sore throat, orthopnea, PND, edema, nausea, vomiting, abdominal pain, constipation, diarrhea or UTI symptoms.  Patient reports a little bit of improvement in his chest tightness and shortness of breath after treatment in ED.  Patient denies smoking cigarette, drinking alcohol or recreational drug use.  He is interested in cardiopulmonary resuscitation in event of sudden cardiopulmonary arrest.  In ED, brief desaturation to 85% on 4 L but recovered to 100% on seven 4 L quickly.  BP 185/94.  RR 24.  Glucose 164.  proBNP 181.  Troponin 20.  WBC 15.  COVID-19, influenza and RSV PCR nonreactive.  Portable CXR without acute finding.  Received IV Solu-Medrol  and nebs.  Admission requested by my colleague from earlier today for COPD exacerbation.   ROS All review of system negative except for pertinent positives and negatives as history of present illness above.  PMH Past Medical History:  Diagnosis Date   Anemia     Aortic atherosclerosis    Aspiration pneumonia due to inhalation of vomitus (HCC) 02/03/2022   Atrial fibrillation (HCC)    Back pain    COPD (chronic obstructive pulmonary disease) (HCC)    COPD exacerbation (HCC) 12/27/2021   COVID-19 12/24/2020   Diabetes mellitus without complication (HCC)    Dizziness 04/08/2022   Elevated PSA    GAD (generalized anxiety disorder)    GERD (gastroesophageal reflux disease)    High cholesterol    Hypertension    Lingular pneumonia 01/05/2021   See cxr 01/06/20 rx with zpak/ cefipime x 5 days and then developed purulent sputum again 01/12/21 so rx levcaquin 500 mg daily x 7 days then return for cxr before more    Lung nodule < 6cm on CT 05/29/2016   Osteoporosis    Oxygen  deficiency    Pneumonia    January 2022   Presence of Watchman left atrial appendage closure device 08/02/2021   s/p LAAO by Dr. Cindie with a 20 mm Watchman FLX device   RSV bronchitis 01/27/2022   Vertebral compression fracture (HCC) 05/29/2019   T8 compression fracture noted on CT scan 05/28/2019 inpatient with chronic steroid dependent COPD - rx calcitonin nasal spray rx per PCP     [facility] Past Surgical History:  Procedure Laterality Date   CATARACT EXTRACTION Right    ELBOW SURGERY  07/2022   surgery on olecranon bursa. Dr. Alyse   IR KYPHO EA ADDL LEVEL THORACIC OR LUMBAR  11/28/2021   LEFT ATRIAL APPENDAGE OCCLUSION N/A 08/02/2021   Procedure: LEFT ATRIAL  APPENDAGE OCCLUSION;  Surgeon: Wonda Sharper, MD;  Location: [facility] INVASIVE CV LAB;  Service: Cardiovascular;  Laterality: N/A;   SKIN SURGERY     shoulders and chest   TEE WITHOUT CARDIOVERSION N/A 08/02/2021   Procedure: TRANSESOPHAGEAL ECHOCARDIOGRAM (TEE);  Surgeon: Wonda Sharper, MD;  Location: [facility] INVASIVE CV LAB;  Service: Cardiovascular;  Laterality: N/A;   TEE WITHOUT CARDIOVERSION N/A 09/13/2021   Procedure: TRANSESOPHAGEAL ECHOCARDIOGRAM (TEE);  Surgeon: Francyne Headland, MD;  Location: [facility] ENDOSCOPY;   Service: Cardiovascular;  Laterality: N/A;    Fam HX Family History  Problem Relation Age of Onset   Heart disease Brother    Heart disease Mother    Heart disease Father    Cancer Paternal Uncle      Social Hx  reports that he quit smoking about 15 years ago. His smoking use included cigarettes. He started smoking about 67 years ago. He has a 104 pack-year smoking history. He has never used smokeless tobacco. He reports that he does not currently use alcohol. He reports that he does not use drugs.   Allergy Allergies  Allergen Reactions   Tape Other (See Comments)    PLEASE USE COBAN WRAP IN LIEU OF TAPE, as it pulls off the skin!!   Daliresp  [Roflumilast ] Diarrhea and Nausea And Vomiting   Levaquin  [Levofloxacin ] Palpitations and Other (See Comments)    Made the B/P fluctuate and heart raced   Alendronate Anxiety and Other (See Comments)    Jittery/nervous    Home Meds Prior to Admission medications   Medication Sig Start Date End Date Taking? Authorizing Provider  albuterol  (PROVENTIL ) (2.5 MG/3ML) 0.083% nebulizer solution Take 3 mLs (2.5 mg total) by nebulization every 6 (six) hours as needed for shortness of breath or wheezing. 10/23/22  Yes Parrett, Tammy S, NP  albuterol  (VENTOLIN  HFA) 108 (90 Base) MCG/ACT inhaler Inhale 1-2 puffs into the lungs every 6 (six) hours as needed for wheezing or shortness of breath. 10/15/24  Yes Sirivol, Mamatha, MD  ALPRAZolam  (XANAX ) 0.5 MG tablet Take 1 tablet (0.5 mg total) by mouth 2 (two) times daily as needed for anxiety. F41.1 09/24/24  Yes Nicholaus Credit, PA-C  amLODipine  (NORVASC ) 5 MG tablet Take 1 tablet (5 mg total) by mouth daily. 09/12/24  Yes Fausto Sor A, DO  arformoterol  (BROVANA ) 15 MCG/2ML NEBU Take 2 mLs (15 mcg total) by nebulization 2 (two) times daily. 10/28/24  Yes Parrett, Tammy S, NP  Ascorbic Acid  (VITAMIN C ) 1000 MG tablet Take 1,000 mg by mouth daily.   Yes [provider]  aspirin  EC 81 MG tablet  Take 1 tablet (81 mg total) by mouth daily. Swallow whole. 01/28/22  Yes McDaniel, Jill D, NP  atorvastatin  (LIPITOR) 40 MG tablet TAKE 1 TABLET BY MOUTH EVERYDAY AT BEDTIME 07/22/24  Yes Cox, Kirsten, MD  CALCIUM  PO Take 1 tablet by mouth daily.   Yes [provider]  Cholecalciferol  (VITAMIN D3) 50 MCG (2000 UT) TABS Take 2,000 Units by mouth in the morning.   Yes [provider]  dapagliflozin  propanediol (FARXIGA ) 10 MG TABS tablet Take 1 tablet (10 mg total) by mouth daily. 07/06/24  Yes Cox, Kirsten, MD  denosumab  (PROLIA ) 60 MG/ML SOSY injection Inject 60 mg into the skin every 6 (six) months. 03/11/23  Yes Cox, Kirsten, MD  digoxin  (LANOXIN ) 0.125 MG tablet TAKE 1 TABLET BY MOUTH DAILY 04/26/24  Yes Jeffrie Oneil BROCKS, MD  ferrous sulfate  325 (65 FE) MG tablet Take 325 mg by mouth 2 (  two) times daily with a meal. Patient taking differently: Take 325 mg by mouth daily with breakfast.   Yes [provider]  furosemide  (LASIX ) 40 MG tablet Take 1 tablet (40 mg total) by mouth daily. 09/12/24  Yes Fausto Sor A, DO  Guaifenesin  (MUCINEX  MAXIMUM STRENGTH) 1200 MG TB12 Take 1,200 mg by mouth daily.   Yes [provider]  levothyroxine  (SYNTHROID ) 25 MCG tablet Take 1 tablet (25 mcg total) by mouth daily. 05/21/24  Yes Cox, Kirsten, MD  Melatonin 12 MG TABS Take 12 mg by mouth at bedtime.   Yes [provider]  Multiple Vitamin (MULTIVITAMIN WITH MINERALS) TABS tablet Take 1 tablet by mouth daily with breakfast.   Yes [provider]  Omega-3 Fatty Acids  (FISH OIL) 1200 MG CAPS Take 1,200 mg by mouth at bedtime.   Yes [provider]  OXYGEN  Inhale 2-3 L/min into the lungs continuous.   Yes [provider]  pantoprazole  (PROTONIX ) 40 MG tablet Take 1 tablet (40 mg total) by mouth 2 (two) times daily. 05/20/24  Yes Parrett, Tammy S, NP  Probiotic Product (PROBIOTIC DAILY) CAPS Take 1 capsule by mouth in the morning.   Yes [provider]  sertraline  (ZOLOFT ) 100 MG tablet Take 1 tablet (100 mg total) by mouth daily. 10/28/24  Yes Cox, Kirsten, MD  Accu-Chek Softclix Lancets lancets Check fasting sugar once daily. 08/05/23   CoxAbigail, MD  budesonide  (PULMICORT ) 0.25 MG/2ML nebulizer solution Take 2 mLs (0.25 mg total) by nebulization 2 (two) times daily. 10/28/24   Parrett, Madelin RAMAN, NP  Glucose Blood (BLOOD GLUCOSE TEST STRIPS) STRP Use to test blood sugar up to twice daily. May substitute to any manufacturer covered by patient's insurance. E11.0 07/14/23   [name], MD  Lancets Misc. MISC Use to test blood sugar up to twice daily. May substitute to any manufacturer covered by patient's insurance. E11.0 07/14/23   CoxAbigail, MD  NUCALA  100 MG/ML SOSY Inject 100 mg into the skin every 30 (thirty) days. Patient not taking: Reported on 11/05/2024    [provider]  revefenacin  (YUPELRI ) 175 MCG/3ML nebulizer solution Take 3 mLs (175 mcg total) by nebulization daily. 10/28/24   Orlie Madelin RAMAN, NP    Physical Exam: Vitals:   11/05/24 1515 11/05/24 1600 11/05/24 1700 11/05/24 1800  BP: (!) 144/61 (!) 142/67  (!) 166/73  Pulse: 93 97  97  Resp: (!) 25 (!) 22  18  Temp:    98.5 F (36.9 C)  TempSrc:    Oral  SpO2: 100% 100% 100% 98%    GENERAL: No acute distress.  Appears well.  HEENT: MMM.  Vision and hearing grossly intact.  NECK: Supple.  No apparent JVD.  RESP:  No IWOB.  Diminished aeration bilaterally.  Expiratory wheeze. CVS:  RRR. Heart sounds normal.  ABD/GI/GU: Bowel sounds present. Soft. Non tender.  MSK/EXT:  Moves extremities. No apparent deformity or edema.  SKIN: no apparent skin lesion or wound NEURO: Awake, alert and oriented appropriately.  No gross deficit.  PSYCH: Calm. Normal affect.    Personally Reviewed Radiological Exams See HPI   Personally Reviewed Labs: CBC: Recent Labs  Lab 11/05/24 1111  WBC 15.0*  HGB 12.8*  HCT 42.7  MCV 80.7  PLT 264   Basic  Metabolic Panel: Recent Labs  Lab 11/05/24 1111 11/05/24 1119  NA 143  --   K 3.7  --   CL 95*  --   CO2 36*  --  GLUCOSE 164*  --   BUN 16  --   CREATININE 0.89  --   CALCIUM  9.5  --   MG  --  2.4  PHOS  --  2.2*   GFR: Estimated Creatinine Clearance: 58.5 mL/min (by C-G formula based on SCr of 0.89 mg/dL). Liver Function Tests: No results for input(s): AST, ALT, ALKPHOS, BILITOT, PROT, ALBUMIN  in the last 168 hours. No results for input(s): LIPASE, AMYLASE in the last 168 hours. No results for input(s): AMMONIA in the last 168 hours. Coagulation Profile: No results for input(s): INR, PROTIME in the last 168 hours. Cardiac Enzymes: No results for input(s): CKTOTAL, CKMB, CKMBINDEX, TROPONINI in the last 168 hours. BNP (last 3 results) Recent Labs    10/04/24 0919 10/14/24 2124 11/05/24 1111  PROBNP 194 262.0 181.0   HbA1C: No results for input(s): HGBA1C in the last 72 hours. CBG: No results for input(s): GLUCAP in the last 168 hours. Lipid Profile: No results for input(s): CHOL, HDL, LDLCALC, TRIG, CHOLHDL, LDLDIRECT in the last 72 hours. Thyroid  Function Tests: No results for input(s): TSH, T4TOTAL, FREET4, T3FREE, THYROIDAB in the last 72 hours. Anemia Panel: No results for input(s): VITAMINB12, FOLATE, FERRITIN, TIBC, IRON, RETICCTPCT in the last 72 hours. Urine analysis:    Component Value Date/Time   COLORURINE YELLOW 08/20/2023 1401   APPEARANCEUR CLEAR 08/20/2023 1401   LABSPEC <=1.005 08/20/2023 1401   PHURINE 5.5 08/20/2023 1401   GLUCOSEU >=500 (A) 08/20/2023 1401   HGBUR NEGATIVE 08/20/2023 1401   BILIRUBINUR NEGATIVE 08/20/2023 1401   BILIRUBINUR Negative 05/10/2021 0931   KETONESUR NEGATIVE 08/20/2023 1401   PROTEINUR NEGATIVE 08/20/2023 1401   UROBILINOGEN negative (A) 05/10/2021 0931   NITRITE NEGATIVE 08/20/2023 1401   LEUKOCYTESUR NEGATIVE 08/20/2023 1401    Assessment  and plan: COPD with acute exacerbation-presents with chest tightness, SOB, DOE, productive cough and hypoxia Acute on chronic respiratory failure with hypoxia-desaturated to 76% on 3 L.  Wears 2 L at baseline. -S/p Solu-Medrol  and multiple rounds of nebulizers in ED. -Continue Solu-Medrol  starting tomorrow -P.o. doxycycline .  He was on Zithromax  recent admission. -Scheduled and as needed DuoNebs -Pulmicort  0.5 mg twice daily -Wean oxygen  as able  Chronic HFpEF: Appears euvolemic on exam.  BNP 181.  CXR without acute finding.   - Hold digoxin  given elevated BP - Continue home Lasix  and Farxiga  - Strict intake and output  OSA on CPAP - Continue CPAP at night  Paroxysmal A-fib s/p Watchman device.  In sinus rhythm.  NIDDM-2 with hyperglycemia: A1c 6.8% on 10/23.  On Farxiga  at home -Continue home Farxiga   Essential hypertension: SBP 160s. - Continue home amlodipine , Farxiga  and Lasix  - Hold digoxin .  Hypothyroidism - Continue home Synthroid   Anxiety/insomnia: Stable - Continue home Zoloft  and melatonin  Elevated troponin: 20.  Likely demand ischemia in the setting of respiratory failure and COPD exacerbation.  - Manage COPD as above  Hyperlipidemia - Continue home Lipitor  Leukocytosis: Likely demargination from steroid.  DVT prophylaxis: Subcu Lovenox   Code Status: Full code-discussed with patient Family Communication: None at bedside  Consults called: None Admission status: Observation   Mignon ONEIDA Bump MD [facility]  If 7PM-7AM, please contact night-coverage [email or web address]  11/05/2024, 6:55 PM

## 2024-11-05 NOTE — Progress Notes (Signed)
 Plan of Care Note for accepted transfer   Patient: EERO DINI MRN: [id number]   DOA: 11/05/2024  Facility requesting transfer: [facility].  Requesting Provider: Duwaine Jensen, MD. Reason for transfer: Acute on chronic respiratory failure with hypoxia. Facility course:  78 year old male with a past medical history of COPD on home O2 at 2 LPM, type 2 diabetes, HTN, GAD, GERD, hypothyroidism, iron deficiency anemia, paroxysmal atrial fibrillation, pulmonary nodules, syncopal episode who has been having progressively worse dyspnea at home despite increasing his use home oxygen .  He received 3 DuoNebs, methylprednisolone , KCl 40 mill colons p.o. and magnesium  sulfate 1 g IVPB.  Plan of care: The patient is accepted for admission to Progressive unit, at [facility]   Author: Alm Dorn Castor, MD 11/05/2024  Check [email or web address] for on-call coverage.  Nursing staff, Please call [facility] Admits & Consults System-Wide number on Amion as soon as patient's arrival, so appropriate admitting provider can evaluate the pt.

## 2024-11-06 DIAGNOSIS — J441 Chronic obstructive pulmonary disease with (acute) exacerbation: Secondary | ICD-10-CM | POA: Diagnosis present

## 2024-11-06 DIAGNOSIS — I480 Paroxysmal atrial fibrillation: Secondary | ICD-10-CM | POA: Diagnosis present

## 2024-11-06 DIAGNOSIS — Z79890 Hormone replacement therapy (postmenopausal): Secondary | ICD-10-CM | POA: Diagnosis not present

## 2024-11-06 DIAGNOSIS — Z7984 Long term (current) use of oral hypoglycemic drugs: Secondary | ICD-10-CM | POA: Diagnosis not present

## 2024-11-06 DIAGNOSIS — Z8249 Family history of ischemic heart disease and other diseases of the circulatory system: Secondary | ICD-10-CM | POA: Diagnosis not present

## 2024-11-06 DIAGNOSIS — Z9981 Dependence on supplemental oxygen: Secondary | ICD-10-CM | POA: Diagnosis not present

## 2024-11-06 DIAGNOSIS — Z1152 Encounter for screening for COVID-19: Secondary | ICD-10-CM | POA: Diagnosis not present

## 2024-11-06 DIAGNOSIS — Z79899 Other long term (current) drug therapy: Secondary | ICD-10-CM | POA: Diagnosis not present

## 2024-11-06 DIAGNOSIS — G4733 Obstructive sleep apnea (adult) (pediatric): Secondary | ICD-10-CM | POA: Diagnosis present

## 2024-11-06 DIAGNOSIS — E7800 Pure hypercholesterolemia, unspecified: Secondary | ICD-10-CM | POA: Diagnosis present

## 2024-11-06 DIAGNOSIS — Z87891 Personal history of nicotine dependence: Secondary | ICD-10-CM | POA: Diagnosis not present

## 2024-11-06 DIAGNOSIS — Z7951 Long term (current) use of inhaled steroids: Secondary | ICD-10-CM | POA: Diagnosis not present

## 2024-11-06 DIAGNOSIS — M810 Age-related osteoporosis without current pathological fracture: Secondary | ICD-10-CM | POA: Diagnosis present

## 2024-11-06 DIAGNOSIS — F419 Anxiety disorder, unspecified: Secondary | ICD-10-CM | POA: Diagnosis present

## 2024-11-06 DIAGNOSIS — E039 Hypothyroidism, unspecified: Secondary | ICD-10-CM | POA: Diagnosis present

## 2024-11-06 DIAGNOSIS — Z8616 Personal history of COVID-19: Secondary | ICD-10-CM | POA: Diagnosis not present

## 2024-11-06 DIAGNOSIS — E876 Hypokalemia: Secondary | ICD-10-CM | POA: Diagnosis present

## 2024-11-06 DIAGNOSIS — G4700 Insomnia, unspecified: Secondary | ICD-10-CM | POA: Diagnosis present

## 2024-11-06 DIAGNOSIS — J9622 Acute and chronic respiratory failure with hypercapnia: Secondary | ICD-10-CM | POA: Diagnosis not present

## 2024-11-06 DIAGNOSIS — E1165 Type 2 diabetes mellitus with hyperglycemia: Secondary | ICD-10-CM | POA: Diagnosis present

## 2024-11-06 DIAGNOSIS — J9621 Acute and chronic respiratory failure with hypoxia: Secondary | ICD-10-CM | POA: Diagnosis not present

## 2024-11-06 DIAGNOSIS — Z7982 Long term (current) use of aspirin: Secondary | ICD-10-CM | POA: Diagnosis not present

## 2024-11-06 DIAGNOSIS — I110 Hypertensive heart disease with heart failure: Secondary | ICD-10-CM | POA: Diagnosis present

## 2024-11-06 DIAGNOSIS — I5032 Chronic diastolic (congestive) heart failure: Secondary | ICD-10-CM | POA: Diagnosis present

## 2024-11-06 LAB — RESPIRATORY PANEL BY PCR

## 2024-11-06 LAB — CBC
HCT: 37.8 % — ABNORMAL LOW (ref 39.0–52.0)
Hemoglobin: 11.4 g/dL — ABNORMAL LOW (ref 13.0–17.0)
MCH: 24.3 pg — ABNORMAL LOW (ref 26.0–34.0)
MCHC: 30.2 g/dL (ref 30.0–36.0)
MCV: 80.6 fL (ref 80.0–100.0)
Platelets: 239 K/uL (ref 150–400)
RBC: 4.69 MIL/uL (ref 4.22–5.81)
RDW: 17 % — ABNORMAL HIGH (ref 11.5–15.5)
WBC: 8.9 K/uL (ref 4.0–10.5)
nRBC: 0 % (ref 0.0–0.2)

## 2024-11-06 LAB — GLUCOSE, CAPILLARY
Glucose-Capillary: 139 mg/dL — ABNORMAL HIGH (ref 70–99)
Glucose-Capillary: 102 mg/dL — ABNORMAL HIGH (ref 70–99)
Glucose-Capillary: 225 mg/dL — ABNORMAL HIGH (ref 70–99)
Glucose-Capillary: 178 mg/dL — ABNORMAL HIGH (ref 70–99)

## 2024-11-06 LAB — COMPREHENSIVE METABOLIC PANEL WITH GFR
ALT: 63 U/L — ABNORMAL HIGH (ref 0–44)
AST: 27 U/L (ref 15–41)
Albumin: 3.6 g/dL (ref 3.5–5.0)
Alkaline Phosphatase: 72 U/L (ref 38–126)
Anion gap: 10 (ref 5–15)
BUN: 21 mg/dL (ref 8–23)
CO2: 34 mmol/L — ABNORMAL HIGH (ref 22–32)
Calcium: 9.4 mg/dL (ref 8.9–10.3)
Chloride: 98 mmol/L (ref 98–111)
Creatinine, Ser: 0.93 mg/dL (ref 0.61–1.24)
GFR, Estimated: >60 mL/min (ref >60)
Glucose, Bld: 212 mg/dL — ABNORMAL HIGH (ref 70–99)
Potassium: 4.7 mmol/L (ref 3.5–5.1)
Sodium: 142 mmol/L (ref 135–145)
Total Bilirubin: 0.3 mg/dL (ref 0.0–1.2)
Total Protein: 6.3 g/dL — ABNORMAL LOW (ref 6.5–8.1)

## 2024-11-06 MED ORDER — DIGOXIN 125 MCG PO TABS
125.0000 ug | ORAL_TABLET | Freq: Every day | ORAL | Status: DC
Start: 2024-11-06 — End: 2024-11-08
  Administered 2024-11-06 – 2024-11-08 (×3): 125 ug via ORAL
  Filled 2024-11-06 (×3): qty 1

## 2024-11-06 MED ORDER — HYDRALAZINE HCL 25 MG PO TABS: 25.0000 mg | ORAL_TABLET | Freq: Three times a day (TID) | ORAL | Status: DC | PRN

## 2024-11-06 MED ORDER — SODIUM CHLORIDE 0.9 % IV SOLN
500.0000 mg | INTRAVENOUS | Status: AC
  Administered 2024-11-06 – 2024-11-08 (×3): 500 mg via INTRAVENOUS
  Filled 2024-11-06 (×3): qty 5

## 2024-11-06 MED ORDER — GUAIFENESIN ER 600 MG PO TB12
1200.0000 mg | ORAL_TABLET | Freq: Every day | ORAL | Status: DC
  Administered 2024-11-06 – 2024-11-08 (×3): 1200 mg via ORAL
  Filled 2024-11-06 (×3): qty 2

## 2024-11-06 NOTE — Hospital Course (Addendum)
 78 y.o. M with COPD, chronic respiratory failure on 2L home O2 noct and PRN, on chronic prednisone  (since ~mid-2023), OSA on CPAP, dCHF, HTN, DM, and pAF with Watchman and hypothyroidism who presented with shortness of breath again and hypoxia to 78%.  Fifth admission this year.  Just got out of hospital 12 days prior to this admission.

## 2024-11-06 NOTE — Progress Notes (Signed)
   11/06/24 0200  BiPAP/CPAP/SIPAP  $ Non-Invasive Home Ventilator  Initial  $ Face Mask Medium Yes  BiPAP/CPAP/SIPAP Pt Type Adult  BiPAP/CPAP/SIPAP DREAMSTATIOND  Mask Type Full face mask  Dentures removed? Not applicable  Mask Size Medium  EPAP 9 cmH2O  Flow Rate 3 lpm  Patient Home Machine No  Patient Home Mask No  Patient Home Tubing No  Auto Titrate No  BiPAP/CPAP /SiPAP Vitals  Resp (!) 26  MEWS Score/Color  MEWS Score 2  MEWS Score Color Yellow

## 2024-11-06 NOTE — Progress Notes (Signed)
  Progress Note   Patient: Paul [name] FMW:[id number] DOB: Jul 28, 1946 DOA: 11/05/2024     0 DOS: the patient was seen and examined on 11/06/2024 at 10:27AM      Brief hospital course: 78 y.o. M with COPD, chronic respiratory failure on 2L home O2 noct and PRN, on chronic prednisone  (since ~mid-2023), OSA on CPAP, dCHF, HTN, DM, and pAF with Watchman and hypothyroidism who presented with shortness of breath again and hypoxia to 78%.  Fifth admission this year.  Just got out of hospital 12 days prior to this admission.     Assessment and Plan: Acute on chronic respiratory failure with hypoxia COPD exacerbation End stage COPD From recent Pulm note:  - ...stable for several months with no hospitalization for 1.5 years until early September 2025.SABRASABRAPrior to recent admissions  maintaining activities such as... gym and playing golf... routine office visit in August in stable condition at that time was recommended to begin Nucala  ... Sept 4...emergency room visit for COPD .[name]. first Nucala  injection Sep 11 ... Then ER visit on September 15th led to an admission ...  Again [admitted] in early October ... In October, significant stress due to family health issues, including his nephew passing away unexpectedly  and scare regarding his son's health ... readmitted to the hospital after being discharged from what he felt was too soon, as he felt unprepared to go home ... During hospital stays ... issues with receiving timely treatments ... Nucala  stopped 11/6 ... Symptoms returned 11/13, readmitted 11/14  During last admission, Pulmonology consulted, there was some history prior to that admission of aspiration. Radiology note no infiltrates on CXR yesterday.  Ambulated today, desaturated to 82% despite 5L O2 - Continue Solu-Medrol  - Continue bronchodilators, scheduled and as needed - Continue Xanax  - Continue Pulmicort  - Continue pantoprazole  - Consult pulmonology, appreciate cares    Sleep  apnea - Continue CPAP  Chronic diastolic congestive heart failure No evidence of fluid overload - Continue Farxiga , furosemide   Anxiety - Continue sertraline , Xanax   Essential hypertension Blood pressure elevated - Continue amlodipine , furosemide  - PRN hydralazine   Hypothyroidism - Continue levothyroxine   Type 2 diabetes Glucose controlled - Continue sliding scale corrections  Paroxysmal atrial fibrillation - Resume digoxin           Subjective: Patient still very weak and SOB with ambulating just to the bathroom.  Pulm consulted, desatted to 82% on 5L while ambulating the hall with nursing.     Physical Exam: BP (!) 163/69   Pulse 76   Temp 97.6 F (36.4 C) (Oral)   Resp 19   Ht 5' 5 (1.651 m)   Wt 70.4 kg   SpO2 98%   BMI 25.83 kg/m   Adult male, lying in bed, interactive and appropriate RRR, no murmurs, no peripheral edema Respiratory rate seems increased, appears short of breath with talking, lung sounds diminished, minimal lung sounds appreciated, no rales Abdomen soft, no tenderness palpation Attention normal, affect normal, judgment and insight appear normal, upper extremity strength appears normal    Data Reviewed: Discussed with pulmonology Basic metabolic panel shows normal electrolytes and renal function, elevated bicarb CBC shows resolution of leukocytosis, mild anemia Chest x-ray personally reviewed, shows no infiltrates or opacities    Family Communication:     Disposition: Status is: Inpatient         Author: Lonni SHAUNNA Dalton, MD 11/06/2024 12:31 PM  For on call review [email or web address].

## 2024-11-06 NOTE — Progress Notes (Signed)
   11/06/24 2249  BiPAP/CPAP/SIPAP  BiPAP/CPAP/SIPAP Pt Type Adult  BiPAP/CPAP/SIPAP DREAMSTATIOND  Mask Type Full face mask  Dentures removed? Not applicable  Mask Size Medium  EPAP 9 cmH2O (home set)  Flow Rate 2 lpm  Patient Home Machine No  Patient Home Mask No  Patient Home Tubing No  Auto Titrate No  CPAP/SIPAP surface wiped down Yes  Device Plugged into RED Power Outlet Yes  BiPAP/CPAP /SiPAP Vitals  Resp (!) 21  MEWS Score/Color  MEWS Score 1  MEWS Score Color Green

## 2024-11-06 NOTE — Consult Note (Addendum)
 NAME:  Paul [name], MRN:  [id number], DOB:  Dec 21, 1946, LOS: 0 ADMISSION DATE:  11/05/2024, CONSULTATION DATE:  11/06/24 REFERRING MD:  Dr. 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 Pulmonology consulted for evaluation and management of latter.  The patient, with COPD, presents with shortness of breath.  He experiences shortness of breath upon waking, with oxygen  levels dropping to 78%. He typically uses two liters of oxygen  at home, especially during sleep.  He has been hospitalized five times since September due to COPD exacerbations. His symptoms have worsened since starting Nucala  injections three months ago, which have since been discontinued. Prior to this, he had been out of the hospital for a year and a half.  His current medication regimen includes daily prednisone  at a dose of 10 mg, and he uses nebulizers with a combination of medications including Brovana  and budesonide  twice daily, and Yupelri  once daily. He recently started a new nebulizer treatment on Friday, just before his current hospital visit.  He quit smoking 18 years ago after a 52-year history of smoking. He uses a CPAP machine with oxygen  supplementation at night, sleeping with it for at least seven hours.  No chest pain, hemoptysis, weight loss, or leg swelling. He reports some chest tightness and shortness of breath when lying flat, requiring him to sleep with his head elevated. He also mentions producing green and yellow sputum and experiencing some wheezing.  Outpatient regimen: - Brovana  BID - Budesonide  BID - Yupelri  OD - Prednisone  10 mg daily -  Nucala  Q 4 weeks (STOPPED) - Duonebs QID - 2 L O2 by [place] - Flutter valve  N.B. previously intolerant to Roflumilast .  Pertinent  Medical History   Past Medical History:  Diagnosis Date   Anemia    Aortic atherosclerosis    Aspiration pneumonia due to inhalation of vomitus (HCC) 02/03/2022   Atrial fibrillation (HCC)    Back pain    COPD (chronic obstructive pulmonary disease) (HCC)    COPD exacerbation (HCC) 12/27/2021   COVID-19 12/24/2020   Diabetes mellitus without complication (HCC)    Dizziness 04/08/2022   Elevated PSA    GAD (generalized anxiety disorder)    GERD (gastroesophageal reflux disease)    High cholesterol    Hypertension    Lingular pneumonia 01/05/2021   See cxr 01/06/20 rx with zpak/ cefipime x 5 days and then developed purulent sputum again 01/12/21 so rx levcaquin 500 mg daily x 7 days then return for cxr before more    Lung nodule < 6cm on CT 05/29/2016   Osteoporosis    Oxygen  deficiency    Pneumonia    January 2022   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 Cindie with a 20 mm Watchman FLX device   RSV bronchitis 01/27/2022   Vertebral compression fracture (HCC) 05/29/2019   T8 compression fracture noted on CT scan 05/28/2019 inpatient with chronic steroid dependent COPD - rx calcitonin nasal spray rx per PCP    Significant Hospital Events: Including procedures, antibiotic start and stop dates in addition to other pertinent events   As above.  Interim  History / Subjective:  As above.  Objective    Blood pressure (!) 147/63, pulse 83, temperature 97.9 F (36.6 C), temperature source Oral, resp. rate 19, height 5' 5 (1.651 m), weight 70.4 kg, SpO2 100%.        Intake/Output Summary (Last 24 hours) at 11/06/2024 1353 Last data filed at 11/06/2024 0800 Gross per 24 hour  Intake 303 ml  Output 400 ml  Net -97 ml   Filed Weights   11/05/24 1800  Weight: 70.4 kg   Examination: General: well  appearing, no distress HENT: anicteric sclera, well injected conjunctivae, oral and nasal mucosa normal Lungs: wheezing on provocative maneuvers Cardiovascular: RRR, no r/m/g Abdomen: soft, non-tender Extremities: no edema Neuro: A&O x 3, no focal deficits GU: Deferred  Resolved problem list   Assessment and Plan   #Acute on chronic hypoxic and hypercapnic respiratory failure #COPD Exacerbation #Grade III, Class E COPD - Agree with steroids, bronchodilators, - Continue nebulized ICS/LAMA/LABA - Start azithromycin  500 mg x 3 days - The patient has previously tried Mepolizumab  and Roflumilast  without good result for exacerbation prophylaxis - Qtc 427, could consider doing Azithromycin  500 mg MWF for exacerbation prophylaxis as an outpatient after discussion with cardiology since patient is on digoxin  long term and per [facility] azithromycin  can increase digoxin  exposure and toxicity. Levels will need to be monitored. This decision can be deferred to outpatient cardiologist and pulmonologist. - Continue supplemental oxygen  - Continue CPAP at night - If he continues to have recurrent exacerbations, can consider Ensifentrine  neb BID as an outpatient - Pulmonary rehab referral upon discharge  Labs   CBC: Recent Labs  Lab 11/05/24 1111 11/06/24 0351  WBC 15.0* 8.9  HGB 12.8* 11.4*  HCT 42.7 37.8*  MCV 80.7 80.6  PLT 264 239    Basic Metabolic Panel: Recent Labs  Lab 11/05/24 1111 11/05/24 1119 11/06/24 0351  NA 143  --  142  K 3.7  --  4.7  CL 95*  --  98  CO2 36*  --  34*  GLUCOSE 164*  --  212*  BUN 16  --  21  CREATININE 0.89  --  0.93  CALCIUM  9.5  --  9.4  MG  --  2.4  --   PHOS  --  2.2*  --    GFR: Estimated Creatinine Clearance: 56 mL/min (by C-G formula based on SCr of 0.93 mg/dL). Recent Labs  Lab 11/05/24 1111 11/06/24 0351  WBC 15.0* 8.9    Liver Function Tests: Recent Labs  Lab 11/06/24 0351  AST 27  ALT 63*  ALKPHOS 72  BILITOT 0.3  PROT 6.3*   ALBUMIN  3.6   No results for input(s): LIPASE, AMYLASE in the last 168 hours. No results for input(s): AMMONIA in the last 168 hours.  ABG    Component Value Date/Time   PHART 7.49 (H) 03/08/2022 1030   PCO2ART 40 03/08/2022 1030   PO2ART 94 03/08/2022 1030   HCO3 36.1 (H) 09/27/2024 0828   TCO2 38 (H) 09/27/2024 0828   ACIDBASEDEF 0.7 07/16/2022 2310   O2SAT 54 09/27/2024 0828     Coagulation Profile: No results for input(s): INR, PROTIME in the last 168 hours.  Cardiac Enzymes: No results for input(s): CKTOTAL, CKMB, CKMBINDEX, TROPONINI in the last 168 hours.  HbA1C: HbA1c POC (<> result, manual entry)  Date/Time Value Ref Range Status  10/14/2024 10:27 AM 6.8 4.0 - 5.6 % Final   Hgb A1c MFr Bld  Date/Time Value Ref Range  Status  07/05/2024 08:02 AM 6.3 (H) 4.8 - 5.6 % Final    Comment:             Prediabetes: 5.7 - 6.4          Diabetes: >6.4          Glycemic control for adults with diabetes: <7.0   03/29/2024 08:16 AM 6.8 (H) 4.8 - 5.6 % Final    Comment:             Prediabetes: 5.7 - 6.4          Diabetes: >6.4          Glycemic control for adults with diabetes: <7.0     CBG: Recent Labs  Lab 11/05/24 2128 11/06/24 0743 11/06/24 1123  GLUCAP 148* 139* 102*    Review of Systems:   Not obtained  Past Medical History:  He,  has a past medical history of Anemia, Aortic atherosclerosis, Aspiration pneumonia due to inhalation of vomitus (HCC) (02/03/2022), Atrial fibrillation (HCC), Back pain, COPD (chronic obstructive pulmonary disease) (HCC), COPD exacerbation (HCC) (12/27/2021), COVID-19 (12/24/2020), Diabetes mellitus without complication (HCC), Dizziness (04/08/2022), Elevated PSA, GAD (generalized anxiety disorder), GERD (gastroesophageal reflux disease), High cholesterol, Hypertension, Lingular pneumonia (01/05/2021), Lung nodule < 6cm on CT (05/29/2016), Osteoporosis, Oxygen  deficiency, Pneumonia, Presence of Watchman left atrial  appendage closure device (08/02/2021), RSV bronchitis (01/27/2022), and Vertebral compression fracture (HCC) (05/29/2019).   Surgical History:   Past Surgical History:  Procedure Laterality Date   CATARACT EXTRACTION Right    ELBOW SURGERY  07/2022   surgery on olecranon bursa. Dr. Alyse   IR KYPHO EA ADDL LEVEL THORACIC OR LUMBAR  11/28/2021   LEFT ATRIAL APPENDAGE OCCLUSION N/A 08/02/2021   Procedure: LEFT ATRIAL APPENDAGE OCCLUSION;  Surgeon: Wonda Sharper, MD;  Location: [facility] INVASIVE CV LAB;  Service: Cardiovascular;  Laterality: N/A;   SKIN SURGERY     shoulders and chest   TEE WITHOUT CARDIOVERSION N/A 08/02/2021   Procedure: TRANSESOPHAGEAL ECHOCARDIOGRAM (TEE);  Surgeon: Wonda Sharper, MD;  Location: [facility] INVASIVE CV LAB;  Service: Cardiovascular;  Laterality: N/A;   TEE WITHOUT CARDIOVERSION N/A 09/13/2021   Procedure: TRANSESOPHAGEAL ECHOCARDIOGRAM (TEE);  Surgeon: Francyne Headland, MD;  Location: [facility] ENDOSCOPY;  Service: Cardiovascular;  Laterality: N/A;     Social History:   reports that he quit smoking about 15 years ago. His smoking use included cigarettes. He started smoking about 67 years ago. He has a 104 pack-year smoking history. He has never used smokeless tobacco. He reports that he does not currently use alcohol. He reports that he does not use drugs.   Family History:  His family history includes Cancer in his paternal uncle; Heart disease in his brother, father, and mother.   Allergies Allergies  Allergen Reactions   Tape Other (See Comments)    PLEASE USE COBAN WRAP IN LIEU OF TAPE, as it pulls off the skin!!   Daliresp  [Roflumilast ] Diarrhea and Nausea And Vomiting   Levaquin  [Levofloxacin ] Palpitations and Other (See Comments)    Made the B/P fluctuate and heart raced   Alendronate Anxiety and Other (See Comments)    Jittery/nervous     Home Medications  Prior to Admission medications   Medication Sig Start Date End Date Taking? Authorizing  Provider  albuterol  (PROVENTIL ) (2.5 MG/3ML) 0.083% nebulizer solution Take 3 mLs (2.5 mg total) by nebulization every 6 (six) hours as needed for shortness of breath or wheezing. 10/23/22  Yes Parrett, Madelin RAMAN, NP  albuterol  (  VENTOLIN  HFA) 108 (90 Base) MCG/ACT inhaler Inhale 1-2 puffs into the lungs every 6 (six) hours as needed for wheezing or shortness of breath. 10/15/24  Yes Sirivol, Mamatha, MD  ALPRAZolam  (XANAX ) 0.5 MG tablet Take 1 tablet (0.5 mg total) by mouth 2 (two) times daily as needed for anxiety. F41.1 09/24/24  Yes Nicholaus Credit, PA-C  amLODipine  (NORVASC ) 5 MG tablet Take 1 tablet (5 mg total) by mouth daily. 09/12/24  Yes Fausto Sor A, DO  arformoterol  (BROVANA ) 15 MCG/2ML NEBU Take 2 mLs (15 mcg total) by nebulization 2 (two) times daily. 10/28/24  Yes Parrett, Tammy S, NP  Ascorbic Acid  (VITAMIN C ) 1000 MG tablet Take 1,000 mg by mouth daily.   Yes [provider]  aspirin  EC 81 MG tablet Take 1 tablet (81 mg total) by mouth daily. Swallow whole. 01/28/22  Yes McDaniel, Jill D, NP  atorvastatin  (LIPITOR) 40 MG tablet TAKE 1 TABLET BY MOUTH EVERYDAY AT BEDTIME 07/22/24  Yes Cox, Kirsten, MD  CALCIUM  PO Take 1 tablet by mouth daily.   Yes [provider]  Cholecalciferol  (VITAMIN D3) 50 MCG (2000 UT) TABS Take 2,000 Units by mouth in the morning.   Yes [provider]  dapagliflozin  propanediol (FARXIGA ) 10 MG TABS tablet Take 1 tablet (10 mg total) by mouth daily. 07/06/24  Yes Cox, Kirsten, MD  denosumab  (PROLIA ) 60 MG/ML SOSY injection Inject 60 mg into the skin every 6 (six) months. 03/11/23  Yes Cox, Kirsten, MD  digoxin  (LANOXIN ) 0.125 MG tablet TAKE 1 TABLET BY MOUTH DAILY 04/26/24  Yes Jeffrie Oneil BROCKS, MD  ferrous sulfate  325 (65 FE) MG tablet Take 325 mg by mouth 2 (two) times daily with a meal. Patient taking differently: Take 325 mg by mouth daily with breakfast.   Yes [provider]  furosemide  (LASIX ) 40 MG tablet Take 1 tablet (40 mg  total) by mouth daily. 09/12/24  Yes Fausto Sor LABOR, DO  Guaifenesin  (MUCINEX  MAXIMUM STRENGTH) 1200 MG TB12 Take 1,200 mg by mouth daily.   Yes [provider]  levothyroxine  (SYNTHROID ) 25 MCG tablet Take 1 tablet (25 mcg total) by mouth daily. 05/21/24  Yes Cox, Kirsten, MD  Melatonin 12 MG TABS Take 12 mg by mouth at bedtime.   Yes [provider]  Multiple Vitamin (MULTIVITAMIN WITH MINERALS) TABS tablet Take 1 tablet by mouth daily with breakfast.   Yes [provider]  Omega-3 Fatty Acids  (FISH OIL) 1200 MG CAPS Take 1,200 mg by mouth at bedtime.   Yes [provider]  OXYGEN  Inhale 2-3 L/min into the lungs continuous.   Yes [provider]  pantoprazole  (PROTONIX ) 40 MG tablet Take 1 tablet (40 mg total) by mouth 2 (two) times daily. 05/20/24  Yes Parrett, Tammy S, NP  Probiotic Product (PROBIOTIC DAILY) CAPS Take 1 capsule by mouth in the morning.   Yes [provider]  sertraline  (ZOLOFT ) 100 MG tablet Take 1 tablet (100 mg total) by mouth daily. 10/28/24  Yes Cox, Kirsten, MD  Accu-Chek Softclix Lancets lancets Check fasting sugar once daily. 08/05/23   CoxAbigail, MD  budesonide  (PULMICORT ) 0.25 MG/2ML nebulizer solution Take 2 mLs (0.25 mg total) by nebulization 2 (two) times daily. 10/28/24   Parrett, Madelin RAMAN, NP  Glucose Blood (BLOOD GLUCOSE TEST STRIPS) STRP Use to test blood sugar up to twice daily. May substitute to any manufacturer covered by patient's insurance. E11.0 07/14/23   [name], MD  Lancets Misc. MISC Use to test  blood sugar up to twice daily. May substitute to any manufacturer covered by patient's insurance. E11.0 07/14/23   CoxAbigail, MD  revefenacin  (YUPELRI ) 175 MCG/3ML nebulizer solution Take 3 mLs (175 mcg total) by nebulization daily. 10/28/24   Parrett, Madelin RAMAN, NP     Thank you for this interesting consult. I have spent 60 minutes evaluating patient, reviewing chart, and discussing plan of care with  patient, family, and primary medical team. If you have any questions or concerns please reach out to me via secure chat.  Paula Southerly, MD [facility]

## 2024-11-06 NOTE — Plan of Care (Signed)

## 2024-11-07 DIAGNOSIS — J9622 Acute and chronic respiratory failure with hypercapnia: Secondary | ICD-10-CM | POA: Diagnosis not present

## 2024-11-07 DIAGNOSIS — J9621 Acute and chronic respiratory failure with hypoxia: Secondary | ICD-10-CM | POA: Diagnosis not present

## 2024-11-07 LAB — GLUCOSE, CAPILLARY
Glucose-Capillary: 127 mg/dL — ABNORMAL HIGH (ref 70–99)
Glucose-Capillary: 120 mg/dL — ABNORMAL HIGH (ref 70–99)
Glucose-Capillary: 188 mg/dL — ABNORMAL HIGH (ref 70–99)
Glucose-Capillary: 148 mg/dL — ABNORMAL HIGH (ref 70–99)

## 2024-11-07 NOTE — Plan of Care (Signed)

## 2024-11-07 NOTE — Consult Note (Addendum)
 NAME:  Paul [name], MRN:  [id number], DOB:  01/06/1946, LOS: 1 ADMISSION DATE:  11/05/2024, CONSULTATION DATE:  11/07/24 REFERRING MD:  Dr. 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 Pulmonology consulted for evaluation and management of latter.  The patient, with COPD, presents with shortness of breath.  He experiences shortness of breath upon waking, with oxygen  levels dropping to 78%. He typically uses two liters of oxygen  at home, especially during sleep.  He has been hospitalized five times since September due to COPD exacerbations. His symptoms have worsened since starting Nucala  injections three months ago, which have since been discontinued. Prior to this, he had been out of the hospital for a year and a half.  His current medication regimen includes daily prednisone  at a dose of 10 mg, and he uses nebulizers with a combination of medications including Brovana  and budesonide  twice daily, and Yupelri  once daily. He recently started a new nebulizer treatment on Friday, just before his current hospital visit.  He quit smoking 18 years ago after a 52-year history of smoking. He uses a CPAP machine with oxygen  supplementation at night, sleeping with it for at least seven hours.  No chest pain, hemoptysis, weight loss, or leg swelling. He reports some chest tightness and shortness of breath when lying flat, requiring him to sleep with his head elevated. He also mentions producing green and yellow sputum and experiencing some wheezing.  Outpatient regimen: - Brovana  BID - Budesonide  BID - Yupelri  OD - Prednisone  10 mg daily -  Nucala  Q 4 weeks (STOPPED) - Duonebs QID - 2 L O2 by Leonard - Flutter valve  N.B. previously intolerant to Roflumilast .  Pertinent  Medical History   Past Medical History:  Diagnosis Date   Anemia    Aortic atherosclerosis    Aspiration pneumonia due to inhalation of vomitus (HCC) 02/03/2022   Atrial fibrillation (HCC)    Back pain    COPD (chronic obstructive pulmonary disease) (HCC)    COPD exacerbation (HCC) 12/27/2021   COVID-19 12/24/2020   Diabetes mellitus without complication (HCC)    Dizziness 04/08/2022   Elevated PSA    GAD (generalized anxiety disorder)    GERD (gastroesophageal reflux disease)    High cholesterol    Hypertension    Lingular pneumonia 01/05/2021   See cxr 01/06/20 rx with zpak/ cefipime x 5 days and then developed purulent sputum again 01/12/21 so rx levcaquin 500 mg daily x 7 days then return for cxr before more    Lung nodule < 6cm on CT 05/29/2016   Osteoporosis    Oxygen  deficiency    Pneumonia    January 2022   Presence of Watchman left atrial appendage closure device 08/02/2021   s/p LAAO by Dr. Cindie with a 20 mm Watchman FLX device   RSV bronchitis 01/27/2022   Vertebral compression fracture (HCC) 05/29/2019   T8 compression fracture noted on CT scan 05/28/2019 inpatient with chronic steroid dependent COPD - rx calcitonin nasal spray rx per PCP    Significant Hospital Events: Including procedures, antibiotic start and stop dates in addition to other pertinent events   11/16 --> doing well,  back to baseline oxygen   Interim History / Subjective:  Reports shortness of breath and cough improved.  Objective    Blood pressure (!) 141/58, pulse 84, temperature 98 F (36.7 C), temperature source Oral, resp. rate 19, height 5' 5 (1.651 m), weight 70.4 kg, SpO2 100%.        Intake/Output Summary (Last 24 hours) at 11/07/2024 1533 Last data filed at 11/07/2024 1129 Gross per 24 hour  Intake 730 ml  Output 1100 ml  Net -370 ml   Filed  Weights   11/05/24 1800  Weight: 70.4 kg   Examination: General: well appearing, no distress HENT: anicteric sclera, well injected conjunctivae, oral and nasal mucosa normal Lungs: clear breath sounds bilaterally Cardiovascular: RRR, no r/m/g Abdomen: soft, non-tender Extremities: no edema Neuro: A&O x 3, no focal deficits GU: Deferred  Resolved problem list   Assessment and Plan   #Acute on chronic hypoxic and hypercapnic respiratory failure #COPD Exacerbation #Grade III, Class E COPD - Agree with steroids, bronchodilators, - Continue nebulized ICS/LAMA/LABA - Start azithromycin  500 mg x 3 days (end date: 11/17) - The patient has previously tried Mepolizumab  and Roflumilast  without good result for exacerbation prophylaxis - Qtc 427, could consider doing Azithromycin  500 mg MWF for exacerbation prophylaxis as an outpatient after discussion with cardiology since patient is on digoxin  long term and per [facility] azithromycin  can increase digoxin  exposure and toxicity. Levels will need to be monitored. This decision can be deferred to outpatient cardiologist and pulmonologist. - Continue supplemental oxygen  - Continue CPAP at night - If he continues to have recurrent exacerbations, can consider Ensifentrine  neb BID as an outpatient - Pulmonary rehab referral upon discharge - He has follow up with Ms. Orlie on 11/15/24  Pulmonology will sign off. Labs   CBC: Recent Labs  Lab 11/05/24 1111 11/06/24 0351  WBC 15.0* 8.9  HGB 12.8* 11.4*  HCT 42.7 37.8*  MCV 80.7 80.6  PLT 264 239    Basic Metabolic Panel: Recent Labs  Lab 11/05/24 1111 11/05/24 1119 11/06/24 0351  NA 143  --  142  K 3.7  --  4.7  CL 95*  --  98  CO2 36*  --  34*  GLUCOSE 164*  --  212*  BUN 16  --  21  CREATININE 0.89  --  0.93  CALCIUM  9.5  --  9.4  MG  --  2.4  --   PHOS  --  2.2*  --    GFR: Estimated Creatinine Clearance: 56 mL/min (by C-G formula based on SCr of 0.93 mg/dL). Recent Labs   Lab 11/05/24 1111 11/06/24 0351  WBC 15.0* 8.9    Liver Function Tests: Recent Labs  Lab 11/06/24 0351  AST 27  ALT 63*  ALKPHOS 72  BILITOT 0.3  PROT 6.3*  ALBUMIN  3.6   No results for input(s): LIPASE, AMYLASE in the last 168 hours. No results for input(s): AMMONIA in the last 168 hours.  ABG    Component Value Date/Time   PHART 7.49 (H) 03/08/2022 1030   PCO2ART 40 03/08/2022 1030   PO2ART 94 03/08/2022 1030   HCO3 36.1 (H) 09/27/2024 0828   TCO2 38 (H) 09/27/2024 0828   ACIDBASEDEF 0.7 07/16/2022 2310   O2SAT 54 09/27/2024 0828     Coagulation Profile: No results for input(s): INR, PROTIME in the last 168 hours.  Cardiac Enzymes: No results for input(s): CKTOTAL, CKMB, CKMBINDEX, TROPONINI in the last 168 hours.  HbA1C: HbA1c POC (<> result, manual  entry)  Date/Time Value Ref Range Status  10/14/2024 10:27 AM 6.8 4.0 - 5.6 % Final   Hgb A1c MFr Bld  Date/Time Value Ref Range Status  07/05/2024 08:02 AM 6.3 (H) 4.8 - 5.6 % Final    Comment:             Prediabetes: 5.7 - 6.4          Diabetes: >6.4          Glycemic control for adults with diabetes: <7.0   03/29/2024 08:16 AM 6.8 (H) 4.8 - 5.6 % Final    Comment:             Prediabetes: 5.7 - 6.4          Diabetes: >6.4          Glycemic control for adults with diabetes: <7.0     CBG: Recent Labs  Lab 11/06/24 1123 11/06/24 1612 11/06/24 2150 11/07/24 0732 11/07/24 1150  GLUCAP 102* 225* 178* 127* 120*    Review of Systems:   Not obtained  Past Medical History:  He,  has a past medical history of Anemia, Aortic atherosclerosis, Aspiration pneumonia due to inhalation of vomitus (HCC) (02/03/2022), Atrial fibrillation (HCC), Back pain, COPD (chronic obstructive pulmonary disease) (HCC), COPD exacerbation (HCC) (12/27/2021), COVID-19 (12/24/2020), Diabetes mellitus without complication (HCC), Dizziness (04/08/2022), Elevated PSA, GAD (generalized anxiety disorder), GERD  (gastroesophageal reflux disease), High cholesterol, Hypertension, Lingular pneumonia (01/05/2021), Lung nodule < 6cm on CT (05/29/2016), Osteoporosis, Oxygen  deficiency, Pneumonia, 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   Surgical History:   Past Surgical History:  Procedure Laterality Date   CATARACT EXTRACTION Right    ELBOW SURGERY  07/2022   surgery on olecranon bursa. Dr. Alyse   IR KYPHO EA ADDL LEVEL THORACIC OR LUMBAR  11/28/2021   LEFT ATRIAL APPENDAGE OCCLUSION N/A 08/02/2021   Procedure: LEFT ATRIAL APPENDAGE OCCLUSION;  Surgeon: Wonda Sharper, MD;  Location: [facility] INVASIVE CV LAB;  Service: Cardiovascular;  Laterality: N/A;   SKIN SURGERY     shoulders and chest   TEE WITHOUT CARDIOVERSION N/A 08/02/2021   Procedure: TRANSESOPHAGEAL ECHOCARDIOGRAM (TEE);  Surgeon: Wonda Sharper, MD;  Location: [facility] INVASIVE CV LAB;  Service: Cardiovascular;  Laterality: N/A;   TEE WITHOUT CARDIOVERSION N/A 09/13/2021   Procedure: TRANSESOPHAGEAL ECHOCARDIOGRAM (TEE);  Surgeon: Francyne Headland, MD;  Location: [facility] ENDOSCOPY;  Service: Cardiovascular;  Laterality: N/A;     Social History:   reports that he quit smoking about 15 years ago. His smoking use included cigarettes. He started smoking about 67 years ago. He has a 104 pack-year smoking history. He has never used smokeless tobacco. He reports that he does not currently use alcohol. He reports that he does not use drugs.   Family History:  His family history includes Cancer in his paternal uncle; Heart disease in his brother, father, and mother.   Allergies Allergies  Allergen Reactions   Tape Other (See Comments)    PLEASE USE COBAN WRAP IN LIEU OF TAPE, as it pulls off the skin!!   Daliresp  [Roflumilast ] Diarrhea and Nausea And Vomiting   Levaquin  [Levofloxacin ] Palpitations and Other (See Comments)    Made the B/P  fluctuate and heart raced   Alendronate Anxiety and Other (See Comments)    Jittery/nervous     Home Medications  Prior to Admission medications   Medication Sig Start Date End Date Taking? Authorizing Provider  albuterol  (PROVENTIL ) (  2.5 MG/3ML) 0.083% nebulizer solution Take 3 mLs (2.5 mg total) by nebulization every 6 (six) hours as needed for shortness of breath or wheezing. 10/23/22  Yes Parrett, Tammy S, NP  albuterol  (VENTOLIN  HFA) 108 (90 Base) MCG/ACT inhaler Inhale 1-2 puffs into the lungs every 6 (six) hours as needed for wheezing or shortness of breath. 10/15/24  Yes Sirivol, Mamatha, MD  ALPRAZolam  (XANAX ) 0.5 MG tablet Take 1 tablet (0.5 mg total) by mouth 2 (two) times daily as needed for anxiety. F41.1 09/24/24  Yes Nicholaus Credit, PA-C  amLODipine  (NORVASC ) 5 MG tablet Take 1 tablet (5 mg total) by mouth daily. 09/12/24  Yes Fausto Sor A, DO  arformoterol  (BROVANA ) 15 MCG/2ML NEBU Take 2 mLs (15 mcg total) by nebulization 2 (two) times daily. 10/28/24  Yes Parrett, Tammy S, NP  Ascorbic Acid  (VITAMIN C ) 1000 MG tablet Take 1,000 mg by mouth daily.   Yes [provider]  aspirin  EC 81 MG tablet Take 1 tablet (81 mg total) by mouth daily. Swallow whole. 01/28/22  Yes McDaniel, Jill D, NP  atorvastatin  (LIPITOR) 40 MG tablet TAKE 1 TABLET BY MOUTH EVERYDAY AT BEDTIME 07/22/24  Yes Cox, Kirsten, MD  CALCIUM  PO Take 1 tablet by mouth daily.   Yes [provider]  Cholecalciferol  (VITAMIN D3) 50 MCG (2000 UT) TABS Take 2,000 Units by mouth in the morning.   Yes [provider]  dapagliflozin  propanediol (FARXIGA ) 10 MG TABS tablet Take 1 tablet (10 mg total) by mouth daily. 07/06/24  Yes Cox, Kirsten, MD  denosumab  (PROLIA ) 60 MG/ML SOSY injection Inject 60 mg into the skin every 6 (six) months. 03/11/23  Yes Cox, Kirsten, MD  digoxin  (LANOXIN ) 0.125 MG tablet TAKE 1 TABLET BY MOUTH DAILY 04/26/24  Yes Jeffrie Oneil BROCKS, MD  ferrous sulfate  325 (65 FE) MG tablet Take  325 mg by mouth 2 (two) times daily with a meal. Patient taking differently: Take 325 mg by mouth daily with breakfast.   Yes [provider]  furosemide  (LASIX ) 40 MG tablet Take 1 tablet (40 mg total) by mouth daily. 09/12/24  Yes Fausto Sor A, DO  Guaifenesin  (MUCINEX  MAXIMUM STRENGTH) 1200 MG TB12 Take 1,200 mg by mouth daily.   Yes [provider]  levothyroxine  (SYNTHROID ) 25 MCG tablet Take 1 tablet (25 mcg total) by mouth daily. 05/21/24  Yes Cox, Kirsten, MD  Melatonin 12 MG TABS Take 12 mg by mouth at bedtime.   Yes [provider]  Multiple Vitamin (MULTIVITAMIN WITH MINERALS) TABS tablet Take 1 tablet by mouth daily with breakfast.   Yes [provider]  Omega-3 Fatty Acids  (FISH OIL) 1200 MG CAPS Take 1,200 mg by mouth at bedtime.   Yes [provider]  OXYGEN  Inhale 2-3 L/min into the lungs continuous.   Yes [provider]  pantoprazole  (PROTONIX ) 40 MG tablet Take 1 tablet (40 mg total) by mouth 2 (two) times daily. 05/20/24  Yes Parrett, Tammy S, NP  Probiotic Product (PROBIOTIC DAILY) CAPS Take 1 capsule by mouth in the morning.   Yes [provider]  sertraline  (ZOLOFT ) 100 MG tablet Take 1 tablet (100 mg total) by mouth daily. 10/28/24  Yes Cox, Kirsten, MD  Accu-Chek Softclix Lancets lancets Check fasting sugar once daily. 08/05/23   CoxAbigail, MD  budesonide  (PULMICORT ) 0.25 MG/2ML nebulizer solution Take 2 mLs (0.25 mg total) by nebulization 2 (two) times daily. 10/28/24   Parrett, Madelin RAMAN, NP  Glucose Blood (BLOOD GLUCOSE TEST  STRIPS) STRP Use to test blood sugar up to twice daily. May substitute to any manufacturer covered by patient's insurance. E11.0 07/14/23   [name], MD  Lancets Misc. MISC Use to test blood sugar up to twice daily. May substitute to any manufacturer covered by patient's insurance. E11.0 07/14/23   CoxClapper, MD  revefenacin  (YUPELRI ) 175 MCG/3ML nebulizer solution Take 3 mLs (175 mcg  total) by nebulization daily. 10/28/24   Parrett, Madelin RAMAN, NP     Thank you for this interesting consult. I have spent 35 minutes evaluating patient, reviewing chart, and discussing plan of care with patient, family, and primary medical team. If you have any questions or concerns please reach out to me via secure chat.  Paula Southerly, MD [facility]

## 2024-11-07 NOTE — Evaluation (Signed)
 Physical Therapy Evaluation Patient Details Name: Paul [name] MRN: [id number] DOB: [birth year] Today's Date: 11/07/2024  History of Present Illness  78 yo male admitted with acute on chronic respiratory failure with hypoxia. Hx of COPD, chronic resp failiure-wears O2 2L-3L, CHF, DM,Afib, hypothyroidism, OSA, Watchman  Clinical Impression  On eval, pt was Ind with mobility. He ambulated ~750 feet around the unit. O2: 87% on 2L, 91-92% on 3L with ambulation. Mild wheezing and dyspnea towards end of distance with 1 brief standing rest break taken. Pt tolerated activity well. No acute PT needs. Could consider OP pulmonary rehab if physician feels it is warranted. 1x eval. Will sign off. Recommend daily ambulation with nursing and/or mobility team as able with continued monitoring of O2 levels.       If plan is discharge home, recommend the following:     Can travel by private vehicle        Equipment Recommendations None recommended by PT  Recommendations for Other Services       Functional Status Assessment Patient has had a recent decline in their functional status and demonstrates the ability to make significant improvements in function in a reasonable and predictable amount of time.     Precautions / Restrictions Precautions Precautions: Fall Precaution/Restrictions Comments: monitor  O2 Restrictions Weight Bearing Restrictions Per Provider Order: No      Mobility  Bed Mobility               General bed mobility comments: oob    Transfers Overall transfer level: Independent Equipment used: None                    Ambulation/Gait Ambulation/Gait assistance: Independent Gait Distance (Feet): 750 Feet Assistive device: None Gait Pattern/deviations: WFL(Within Functional Limits)       General Gait Details: Intermittent unsteadiness with head turns but no overt LOB. O2: 87% on 2L, 91-92% on 3L. Some mild dyspnea and wheezing towards end of  distance.  Stairs            Wheelchair Mobility     Tilt Bed    Modified Rankin (Stroke Patients Only)       Balance Overall balance assessment: Mild deficits observed, not formally tested                                           Pertinent Vitals/Pain Pain Assessment Pain Assessment: No/denies pain    Home Living Family/patient expects to be discharged to:: Private residence Living Arrangements: Spouse/significant other Available Help at Discharge: Family;Available 24 hours/day Type of Home: House Home Access: Level entry       Home Layout: One level Home Equipment: Rollator (4 wheels);Shower seat;Grab bars - tub/shower;Cane - single point Additional Comments: On home O2 when sleeping or with exercise    Prior Function Prior Level of Function : Independent/Modified Independent;Driving             Mobility Comments: Active, Walks without AD, can ambulate in community, denies falls ADLs Comments: Independent adls and iadls     Extremity/Trunk Assessment   Upper Extremity Assessment Upper Extremity Assessment: Overall WFL for tasks assessed    Lower Extremity Assessment Lower Extremity Assessment: Overall WFL for tasks assessed    Cervical / Trunk Assessment Cervical / Trunk Assessment: Normal  Communication   Communication Communication: No apparent difficulties    Cognition Arousal:  Alert Behavior During Therapy: WFL for tasks assessed/performed   PT - Cognitive impairments: No apparent impairments                         Following commands: Intact       Cueing Cueing Techniques: Verbal cues     General Comments      Exercises     Assessment/Plan    PT Assessment Patient does not need any further PT services  PT Problem List         PT Treatment Interventions      PT Goals (Current goals can be found in the Care Plan section)  Acute Rehab PT Goals Patient Stated Goal: regain PLOF. home. PT  Goal Formulation: All assessment and education complete, DC therapy    Frequency       Co-evaluation               AM-PAC PT 6 Clicks Mobility  Outcome Measure Help needed turning from your back to your side while in a flat bed without using bedrails?: None Help needed moving from lying on your back to sitting on the side of a flat bed without using bedrails?: None Help needed moving to and from a bed to a chair (including a wheelchair)?: None Help needed standing up from a chair using your arms (e.g., wheelchair or bedside chair)?: None Help needed to walk in hospital room?: None Help needed climbing 3-5 steps with a railing? : A Little 6 Click Score: 23    End of Session Equipment Utilized During Treatment: Oxygen  Activity Tolerance: Patient tolerated treatment well Patient left: in chair;with call bell/phone within reach        Time: 1236-1246 PT Time Calculation (min) (ACUTE ONLY): 10 min   Charges:   PT Evaluation $PT Eval Low Complexity: 1 Low   PT General Charges $$ ACUTE PT VISIT: 1 Visit            Dannial SQUIBB, PT Acute Rehabilitation  Office: [phone]

## 2024-11-07 NOTE — Progress Notes (Signed)
  Progress Note   Patient: Paul [name] FMW:[id number] DOB: 1946/05/27 DOA: 11/05/2024     1 DOS: the patient was seen and examined on 11/07/2024 at 8:54AM      Brief hospital course: 78 y.o. M with COPD, chronic respiratory failure on 2L home O2 noct and PRN, on chronic prednisone  (since ~mid-2023), OSA on CPAP, dCHF, HTN, DM, and pAF with Watchman and hypothyroidism who presented with shortness of breath again and hypoxia to 78%.  Fifth admission this year.  Just got out of hospital 12 days prior to this admission.     Assessment and Plan: Acute on chronic respiratory failure with hypoxia COPD exacerbation End stage COPD See longer summary from 11/15 Has not yet ambulated today - Continue steroids, bronchodilators - Azithromycin  ordered yesterday by pulmonology - Continue Pulmicort , Xanax , pantoprazole   - Consult pulmonology, appreciate cares       Sleep apnea -CPAP at night   Chronic diastolic congestive heart failure Does not appear fluid overloaded - Continue furosemide  and pegol flows and   Anxiety -Continue alprazolam  and sertraline    Essential hypertension Blood pressure normal - Continue amlodipine , home furosemide  - As needed hydralazine    Hypothyroidism -Continue levothyroxine    Type 2 diabetes Glucose controlled - Continue Farxiga  and sliding scale corrections   Paroxysmal atrial fibrillation History of Watchman device, not on anticoagulation -Continue digoxin              Subjective: No new change, no nursing concerns.  Pulmonology evaluated the patient yesterday, added azithromycin .     Physical Exam: BP (!) 120/107 (BP Location: Right Arm)   Pulse 85   Temp 97.6 F (36.4 C) (Oral)   Resp 18   Ht 5' 5 (1.651 m)   Wt 70.4 kg   SpO2 98%   BMI 25.83 kg/m   Adult male, lying in bed, interactive and appropriate RRR, no murmurs, no peripheral edema Pursed lip breathing noted, respiratory rate seems normal, lung sounds very  diminished, no wheezing appreciated today, overall air movement seems slightly better than yesterday Abdomen soft, no tenderness to palpation Attention normal, affect appropriate, judgment and insight appear normal    Data Reviewed: Basic metabolic panel shows elevated bicarb, otherwise normal electrolytes and renal function CBC shows mild anemia, no leukocytosis     Family Communication:     Disposition: Status is: Inpatient         Author: Lonni SHAUNNA Dalton, MD 11/07/2024 10:35 AM  For on call review [email or web address].

## 2024-11-07 NOTE — Progress Notes (Signed)
   11/07/24 2238  BiPAP/CPAP/SIPAP  BiPAP/CPAP/SIPAP Pt Type Adult  BiPAP/CPAP/SIPAP DREAMSTATIOND  Mask Type Full face mask  Dentures removed? Not applicable  Mask Size Medium  EPAP 9 cmH2O (home set)  Flow Rate 2 lpm  Patient Home Machine No  Patient Home Mask No  Patient Home Tubing No  Auto Titrate No  CPAP/SIPAP surface wiped down Yes  Device Plugged into RED Power Outlet Yes  BiPAP/CPAP /SiPAP Vitals  Resp 19  MEWS Score/Color  MEWS Score 0  MEWS Score Color Green

## 2024-11-08 ENCOUNTER — Other Ambulatory Visit (HOSPITAL_COMMUNITY): Payer: Self-pay

## 2024-11-08 ENCOUNTER — Ambulatory Visit: Admitting: Cardiology

## 2024-11-08 LAB — COMPREHENSIVE METABOLIC PANEL WITH GFR
ALT: 68 U/L — ABNORMAL HIGH (ref 0–44)
AST: 27 U/L (ref 15–41)
Albumin: 3.7 g/dL (ref 3.5–5.0)
Alkaline Phosphatase: 75 U/L (ref 38–126)
Anion gap: 8 (ref 5–15)
BUN: 33 mg/dL — ABNORMAL HIGH (ref 8–23)
CO2: 34 mmol/L — ABNORMAL HIGH (ref 22–32)
Calcium: 9.1 mg/dL (ref 8.9–10.3)
Chloride: 99 mmol/L (ref 98–111)
Creatinine, Ser: 1.05 mg/dL (ref 0.61–1.24)
GFR, Estimated: >60 mL/min (ref >60)
Glucose, Bld: 179 mg/dL — ABNORMAL HIGH (ref 70–99)
Potassium: 4.8 mmol/L (ref 3.5–5.1)
Sodium: 141 mmol/L (ref 135–145)
Total Bilirubin: 0.3 mg/dL (ref 0.0–1.2)
Total Protein: 6.1 g/dL — ABNORMAL LOW (ref 6.5–8.1)

## 2024-11-08 LAB — CBC
HCT: 37.1 % — ABNORMAL LOW (ref 39.0–52.0)
Hemoglobin: 11.1 g/dL — ABNORMAL LOW (ref 13.0–17.0)
MCH: 24.2 pg — ABNORMAL LOW (ref 26.0–34.0)
MCHC: 29.9 g/dL — ABNORMAL LOW (ref 30.0–36.0)
MCV: 81 fL (ref 80.0–100.0)
Platelets: 227 K/uL (ref 150–400)
RBC: 4.58 MIL/uL (ref 4.22–5.81)
RDW: 16.6 % — ABNORMAL HIGH (ref 11.5–15.5)
WBC: 8.6 K/uL (ref 4.0–10.5)
nRBC: 0 % (ref 0.0–0.2)

## 2024-11-08 LAB — GLUCOSE, CAPILLARY
Glucose-Capillary: 120 mg/dL — ABNORMAL HIGH (ref 70–99)
Glucose-Capillary: 130 mg/dL — ABNORMAL HIGH (ref 70–99)

## 2024-11-08 MED ORDER — PREDNISONE 10 MG PO TABS
ORAL_TABLET | ORAL | 0 refills | Status: AC
  Filled 2024-11-08: qty 22, 10d supply, fill #0

## 2024-11-08 NOTE — Discharge Summary (Signed)
 Physician Discharge Summary   Patient: Paul [name] MRN: [id number] DOB: [birth year]  Admit date:     11/05/2024  Discharge date: 11/08/24  Discharge Physician: Lonni SHAUNNA Dalton   PCP: [name], MD     Recommendations at discharge:  Follow up with Pulmonology Dr. Darlean and Madelin Parrett for GOLD III, class E COPD Follow up with Cardiology Lesley Maffucci in 1 week for [facility] and AFib Dr. Darlean and Dr. Jeffrie:  Would patient be candidate for concurrent digoxin  and azithromycin  use, or which would be preferable  Referred to outpatient pulmonary rehab Ensifentrine  per Pulmonology     Discharge Diagnoses: Principal Problem:   Acute on chronic respiratory failure with hypoxia and hypercapnia (HCC) Active Problems:   COPD with acute exacerbation (HCC)   Obstructive sleep apnea   Chronic diastolic congestive heart failure   Anxiety   Essential hypertension   Hypothyroidism   Type 2 diabetes with hyperglycemia   Paroxysmal atrial fibrillation   Anemia of chronic disease   Hospital Course: 78 y.o. M with COPD, chronic respiratory failure on 2L home O2 noct and PRN, on chronic prednisone  (since ~mid-2023), OSA on CPAP, dCHF, HTN, DM, and pAF with Watchman and hypothyroidism who presented with shortness of breath again and hypoxia to 78%.  Fifth admission this year.  Just got out of hospital 12 days prior to this admission.     Acute on chronic respiratory failure with hypoxia COPD exacerbation End stage COPD Stable for over a year until early September 2025, after beginning Nucala , patient has been admitted 5 times.  Nucala  now stopped  No obvious trigger for the current hospitalization, patient just became short of breath, anxious, and called EMS in distress.  Chest x-ray clear.  Desaturating to mid 70s even on home oxygen .  Started her on steroids, pulmonology were consulted.  They added 3 days azithromycin  500 mg daily, and the patient was weaned to his home oxygen   level, able to ambulate typical home distance without desaturating on home oxygen .  Pulmonology recommend consideration of long-term 3 times weekly azithromycin  (defer cessation or close monitoring of digoxin  to outpatient Cardiology/Pulmonology teams).  Referred to pulmonary rehab.  Discharged on 1 week prednisone  taper       Sleep apnea Stable on CPAP   Chronic diastolic congestive heart failure No evidence of fluid overload, stable on Farxiga  and Lasix    Anxiety Controlled with Xanax  and sertraline    Essential hypertension Blood pressure controlled on amlodipine , Lasix    Hypothyroidism On levothyroxine    Type 2 diabetes Moderate hyperglycemia here in the setting of high-dose steroids   Paroxysmal atrial fibrillation Stable on digoxin , Watchman device, no anticoagulation           The [place]  Controlled Substances Registry was reviewed for this patient prior to discharge.  Consultants: Pulmonology, Dr. Catherine   Disposition: Home Diet recommendation:  Carb modified diet  DISCHARGE MEDICATION: Allergies as of 11/08/2024       Reactions   Tape Other (See Comments)   PLEASE USE COBAN WRAP IN LIEU OF TAPE, as it pulls off the skin!!   Daliresp  [roflumilast ] Diarrhea, Nausea And Vomiting   Levaquin  [levofloxacin ] Palpitations, Other (See Comments)   Made the B/P fluctuate and heart raced   Alendronate Anxiety, Other (See Comments)   Jittery/nervous        Medication List     TAKE these medications    Accu-Chek Softclix Lancets lancets Check fasting sugar once daily.   albuterol  (2.5 MG/3ML) 0.083%  nebulizer solution Commonly known as: PROVENTIL  Take 3 mLs (2.5 mg total) by nebulization every 6 (six) hours as needed for shortness of breath or wheezing.   albuterol  108 (90 Base) MCG/ACT inhaler Commonly known as: Ventolin  HFA Inhale 1-2 puffs into the lungs every 6 (six) hours as needed for wheezing or shortness of breath.   ALPRAZolam   0.5 MG tablet Commonly known as: XANAX  Take 1 tablet (0.5 mg total) by mouth 2 (two) times daily as needed for anxiety. F41.1   amLODipine  5 MG tablet Commonly known as: NORVASC  Take 1 tablet (5 mg total) by mouth daily.   arformoterol  15 MCG/2ML Nebu Commonly known as: BROVANA  Take 2 mLs (15 mcg total) by nebulization 2 (two) times daily.   aspirin  EC 81 MG tablet Take 1 tablet (81 mg total) by mouth daily. Swallow whole.   atorvastatin  40 MG tablet Commonly known as: LIPITOR TAKE 1 TABLET BY MOUTH EVERYDAY AT BEDTIME   BLOOD GLUCOSE TEST STRIPS Strp Use to test blood sugar up to twice daily. May substitute to any manufacturer covered by patient's insurance. E11.0   budesonide  0.25 MG/2ML nebulizer solution Commonly known as: Pulmicort  Take 2 mLs (0.25 mg total) by nebulization 2 (two) times daily.   CALCIUM  PO Take 1 tablet by mouth daily.   dapagliflozin  propanediol 10 MG Tabs tablet Commonly known as: Farxiga  Take 1 tablet (10 mg total) by mouth daily.   denosumab  60 MG/ML Sosy injection Commonly known as: PROLIA  Inject 60 mg into the skin every 6 (six) months.   digoxin  0.125 MG tablet Commonly known as: LANOXIN  TAKE 1 TABLET BY MOUTH DAILY   ferrous sulfate  325 (65 FE) MG tablet Take 325 mg by mouth 2 (two) times daily with a meal. What changed: when to take this   Fish Oil 1200 MG Caps Take 1,200 mg by mouth at bedtime.   furosemide  40 MG tablet Commonly known as: LASIX  Take 1 tablet (40 mg total) by mouth daily.   Lancets Misc. Misc Use to test blood sugar up to twice daily. May substitute to any manufacturer covered by patient's insurance. E11.0   levothyroxine  25 MCG tablet Commonly known as: SYNTHROID  Take 1 tablet (25 mcg total) by mouth daily.   Melatonin 12 MG Tabs Take 12 mg by mouth at bedtime.   Mucinex  Maximum Strength 1200 MG Tb12 Generic drug: Guaifenesin  Take 1,200 mg by mouth daily.   multivitamin with minerals Tabs tablet Take  1 tablet by mouth daily with breakfast.   OXYGEN  Inhale 2-3 L/min into the lungs continuous.   pantoprazole  40 MG tablet Commonly known as: PROTONIX  Take 1 tablet (40 mg total) by mouth 2 (two) times daily.   predniSONE  10 MG tablet Commonly known as: DELTASONE  Take 4 tablets (40 mg total) by mouth daily for 1 day, THEN 3 tablets (30 mg total) daily for 3 days, THEN 2 tablets (20 mg total) daily for 3 days, THEN 1 tablet (10 mg total) daily for 3 days. Start taking on: November 08, 2024   Probiotic Daily Caps Take 1 capsule by mouth in the morning.   revefenacin  175 MCG/3ML nebulizer solution Commonly known as: YUPELRI  Take 3 mLs (175 mcg total) by nebulization daily.   sertraline  100 MG tablet Commonly known as: ZOLOFT  Take 1 tablet (100 mg total) by mouth daily.   vitamin C  1000 MG tablet Take 1,000 mg by mouth daily.   Vitamin D3 50 MCG (2000 UT) Tabs Take 2,000 Units by mouth in the morning.  Discharge Instructions     AMB referral to pulmonary rehabilitation   Complete by: As directed    Please select a program: Pulmonary Rehabilitation   Diagnosis: (See process instructions below for COPD requirements): COPD-Gold 3: Severe   30% </= FEV1 <50% predicted   After initial evaluation and assessments completed: Virtual Based Care may be provided alone or in conjunction with Pulmonary Rehab/Respiratory Care services based on patient barriers.: Yes   Discharge instructions   Complete by: As directed    **IMPORTANT DISCHARGE INSTRUCTIONS**   From Dr. Jonel: You were admitted for trouble breathing from exacerbation of COPD Here, you were treated with steroids and bronchodilators. You were seen by Pulmonology and started on azithromycin  and completed the three day course  Take the following steroid taper: Take prednisone  4 tablets (40 mg total) by mouth daily for 1 day (tomorrow Tuesday 11/18), THEN  Take prednisone  3 tablets (30 mg total) daily for 3 days  (11/19-21), THEN  Take 2 tablets (20 mg total) daily for 3 days (11/22-11/24), THEN  Take 1 tablet (10 mg total) daily for 3 days (11/25-11/27) then stop  Continue your home Brovana , Yupelri  and Pulmicort  Take Xanax  as needed for anxiety or air hunger Continue pantoprazole  to suppress stomach acid that can exacerbate lung disease  Go see your cardiologist Lesley Maffucci on 11/21 Ask them about whether there is an alternative to digoxin  for heart rate control (if you start azithromycin )  Go see Tammy Parrett on 11/24 Ask her about azithromycin  and Ensifentrine  (you don't have to remember those words< I will send them the discharge summaries)  I have sent you a referral to Pulmonary rehab   Increase activity slowly   Complete by: As directed    No wound care   Complete by: As directed        Discharge Exam: Filed Weights   11/05/24 1800  Weight: 70.4 kg   BP 136/67 (BP Location: Right Arm)   Pulse 86   Temp 98.3 F (36.8 C) (Oral)   Resp 17   Ht 5' 5 (1.651 m)   Wt 70.4 kg   SpO2 98%   BMI 25.83 kg/m     General: Pt is alert, awake, not in acute distress Cardiovascular: RRR, nl S1-S2, soft systolic murmur noted.   No LE edema.   Respiratory: Normal respiratory rate and rhythm.  Diminished but no rales or wheezes appreciated Abdominal: Abdomen soft and non-tender.  No distension or HSM.   Neuro/Psych: Strength symmetric in upper and lower extremities.  Judgment and insight appear normal.   Condition at discharge: fair  The results of significant diagnostics from this hospitalization (including imaging, microbiology, ancillary and laboratory) are listed below for reference.   Imaging Studies: DG Chest 2 View Result Date: 11/05/2024 EXAM: 2 VIEW(S) XRAY OF THE CHEST 11/05/2024 11:24:00 AM COMPARISON: 16 days ago. CLINICAL HISTORY: SOb. FINDINGS: LUNGS AND PLEURA: No focal pulmonary opacity. No pleural effusion. No pneumothorax. HEART AND MEDIASTINUM: No acute  abnormality of the cardiac and mediastinal silhouettes. BONES AND SOFT TISSUES: No acute osseous abnormality. IMPRESSION: 1. No acute cardiopulmonary disease. Electronically signed by: Lynwood Seip MD 11/05/2024 11:40 AM EST RP Workstation: [id number]   ECHOCARDIOGRAM COMPLETE Result Date: 10/29/2024    ECHOCARDIOGRAM REPORT   Patient Name:   RISHI VICARIO Date of Exam: 10/29/2024 Medical Rec #:  [id number]      Height:       65.0 in Accession #:    [id number]  Weight:       155.0 lb Date of Birth:  May 01, 1946       BSA:          1.775 m Patient Age:    78 years       BP:           138/62 mmHg Patient Gender: M              HR:           95 bpm. Exam Location:  [place] Procedure: 2D Echo, Cardiac Doppler and Color Doppler (Both Spectral and Color            Flow Doppler were utilized during procedure). Indications:    Chronic diastolic CHF (congestive heart failure) (HCC) [I50.32                 (ICD-10-CM)]  History:        Patient has prior history of Echocardiogram examinations, most                 recent 01/26/2023. CAD, COPD, Arrythmias:Atrial Fibrillation,                 Signs/Symptoms:Dyspnea; Risk Factors:Diabetes, Hypertension and                 Dyslipidemia. Oxygen  dependant. 20mm Watchman Device implanted                 08/02/21.  Sonographer:    Charlie Jointer RDCS Referring Phys: [phone] [facility] S PARRETT  Sonographer Comments: Global longitudinal strain was attempted. IMPRESSIONS  1. TDS, limited study. Left ventricular ejection fraction, by estimation, is 60 to 65%. The left ventricle has normal function. The left ventricle has no regional wall motion abnormalities. There is mild left ventricular hypertrophy. Left ventricular diastolic parameters are indeterminate.  2. Right ventricular systolic function is normal. The right ventricular size is normal.  3. The mitral valve is normal in structure. No evidence of mitral valve regurgitation. No evidence of mitral stenosis.  4. The aortic valve is  normal in structure. Aortic valve regurgitation is not visualized. No aortic stenosis is present.  5. The inferior vena cava is normal in size with greater than 50% respiratory variability, suggesting right atrial pressure of 3 mmHg. FINDINGS  Left Ventricle: TDS, limited study. Left ventricular ejection fraction, by estimation, is 60 to 65%. The left ventricle has normal function. The left ventricle has no regional wall motion abnormalities. The left ventricular internal cavity size was normal in size. There is mild left ventricular hypertrophy. Left ventricular diastolic parameters are indeterminate. Right Ventricle: The right ventricular size is normal. No increase in right ventricular wall thickness. Right ventricular systolic function is normal. Left Atrium: Left atrial size was normal in size. Right Atrium: Right atrial size was normal in size. Pericardium: There is no evidence of pericardial effusion. Mitral Valve: The mitral valve is normal in structure. Mild mitral annular calcification. No evidence of mitral valve regurgitation. No evidence of mitral valve stenosis. Tricuspid Valve: The tricuspid valve is normal in structure. Tricuspid valve regurgitation is not demonstrated. No evidence of tricuspid stenosis. Aortic Valve: The aortic valve is normal in structure. Aortic valve regurgitation is not visualized. No aortic stenosis is present. Pulmonic Valve: The pulmonic valve was normal in structure. Pulmonic valve regurgitation is not visualized. No evidence of pulmonic stenosis. Aorta: The aortic root is normal in size and structure. Venous: The inferior vena cava is normal in size with greater than  50% respiratory variability, suggesting right atrial pressure of 3 mmHg. IAS/Shunts: No atrial level shunt detected by color flow Doppler.  LEFT VENTRICLE PLAX 2D LVIDd:         3.85 cm   Diastology LVIDs:         2.85 cm   LV e' medial:    8.73 cm/s LV PW:         1.05 cm   LV E/e' medial:  9.9 LV IVS:         1.20 cm   LV e' lateral:   8.81 cm/s LVOT diam:     1.80 cm   LV E/e' lateral: 9.9 LV SV:         45 LV SV Index:   25 LVOT Area:     2.54 cm  RIGHT VENTRICLE             IVC RV Basal diam:  3.60 cm     IVC diam: 1.70 cm RV Mid diam:    3.40 cm RV S prime:     11.60 cm/s TAPSE (M-mode): 2.1 cm LEFT ATRIUM             Index        RIGHT ATRIUM           Index LA diam:        2.95 cm 1.66 cm/m   RA Area:     11.00 cm LA Vol (A2C):   18.1 ml 10.20 ml/m  RA Volume:   22.60 ml  12.73 ml/m LA Vol (A4C):   31.6 ml 17.80 ml/m LA Biplane Vol: 23.9 ml 13.46 ml/m  AORTIC VALVE LVOT Vmax:   101.17 cm/s LVOT Vmean:  67.633 cm/s LVOT VTI:    0.176 m  AORTA Ao Root diam: 3.15 cm Ao Asc diam:  2.60 cm Ao Desc diam: 2.10 cm MITRAL VALVE MV Area (PHT): 3.92 cm    SHUNTS MV Decel Time: 194 msec    Systemic VTI:  0.18 m MV E velocity: 86.80 cm/s  Systemic Diam: 1.80 cm MV A velocity: 92.50 cm/s MV E/A ratio:  0.94 Lamar Fitch MD Electronically signed by Lamar Fitch MD Signature Date/Time: 10/29/2024/12:55:31 PM    Final    DG Chest Port 1 View Result Date: 10/20/2024 EXAM: 1 VIEW(S) XRAY OF THE CHEST 10/20/2024 08:57:47 AM COMPARISON: 10/14/2024 CLINICAL HISTORY: Shortness of breath. Reports SHOB since this morning. Discharged from [place] yesterday for COPD exacerbation. States was sent home on 2L [place] Denies chest pain, dizziness, N/V, fevers RRT in triage FINDINGS: LUNGS AND PLEURA: Lung hyperinflation. No focal pulmonary opacity. No pulmonary edema. No pleural effusion. No pneumothorax. HEART AND MEDIASTINUM: Atherosclerotic calcifications. Left atrial appendage occlusion device noted. No acute abnormality of the cardiac and mediastinal silhouettes. BONES AND SOFT TISSUES: No acute osseous abnormality. IMPRESSION: 1. Lung hyperinflation, consistent with COPD. 2. No acute findings. Electronically signed by: Evalene Coho MD 10/20/2024 09:17 AM EDT RP Workstation: [id number]   CT Angio Chest/Abd/Pel for  Dissection W and/or Wo Contrast Result Date: 10/14/2024 CLINICAL DATA:  Chest and right flank pain EXAM: CT ANGIOGRAPHY CHEST, ABDOMEN AND PELVIS TECHNIQUE: Non-contrast CT of the chest was initially obtained. Multidetector CT imaging through the chest, abdomen and pelvis was performed using the standard protocol during bolus administration of intravenous contrast. Multiplanar reconstructed images and MIPs were obtained and reviewed to evaluate the vascular anatomy. RADIATION DOSE REDUCTION: This exam was performed according to the departmental dose-optimization program which includes automated  exposure control, adjustment of the mA and/or kV according to patient size and/or use of iterative reconstruction technique. CONTRAST:  OMNIPAQUE  IOHEXOL  350 MG/ML SOLN COMPARISON:  05/29/2023 FINDINGS: CTA CHEST FINDINGS Cardiovascular: Initial precontrast images demonstrate atherosclerotic calcification. No aneurysmal dilatation is seen. No cardiac enlargement is noted. Coronary calcifications are seen. Left atrial appendage occlusion device is noted in place. Postcontrast images show no evidence of aortic dissection. No pulmonary embolism is seen although not timed for embolus evaluation. Mediastinum/Nodes: Thoracic inlet is within normal limits. No hilar or mediastinal adenopathy is noted. The esophagus as visualized is within normal limits. Lungs/Pleura: Diffuse emphysematous changes are noted. No focal infiltrate or sizable effusion is noted. No parenchymal nodule is seen. Musculoskeletal: Chronic compression deformities of the thoracic spine are noted involving T4, T5, T7, T8 and T9. Review of the MIP images confirms the above findings. CTA ABDOMEN AND PELVIS FINDINGS VASCULAR Aorta: Abdominal aorta demonstrates atherosclerotic calcifications. No aneurysmal dilatation is seen. Celiac: Patent without evidence of aneurysm, dissection, vasculitis or significant stenosis. SMA: Patent without evidence of aneurysm,  dissection, vasculitis or significant stenosis. Renals: Both renal arteries are patent without evidence of aneurysm, dissection, vasculitis, fibromuscular dysplasia or significant stenosis. IMA: Patent without evidence of aneurysm, dissection, vasculitis or significant stenosis. Inflow: Iliacs demonstrate atherosclerotic calcification without focal stenosis. Veins: No specific venous abnormality is noted. Review of the MIP images confirms the above findings. NON-VASCULAR Hepatobiliary: Fatty infiltration of the liver is noted. The gallbladder is partially distended. A few dependent gallstones are seen. No biliary ductal dilatation is noted. Pancreas: Unremarkable. No pancreatic ductal dilatation or surrounding inflammatory changes. Spleen: Normal in size without focal abnormality. Adrenals/Urinary Tract: Adrenal glands are within normal limits. Kidneys demonstrate a normal enhancement pattern bilaterally. No obstructive changes are noted. Evaluation for calculi is limited due to contrast enhancement. The bladder is well distended with opacified and unopacified urine. Stomach/Bowel: Scattered diverticular change of the colon is noted without evidence of diverticulitis. No obstructive changes are seen. The appendix is within normal limits. Small bowel and stomach are unremarkable. Lymphatic: No lymphadenopathy is seen. Reproductive: Prostate is unremarkable. Other: No abdominal wall hernia or abnormality. No abdominopelvic ascites. Musculoskeletal: L1 compression deformity is noted with evidence of prior vertebral augmentation. Review of the MIP images confirms the above findings. IMPRESSION: CTA of the chest: No evidence of aortic dissection or pulmonary embolism. Pulmonary emphysema is present. Pulmonary emphysema is an independent risk factor for lung cancer. Recommend patient be considered for lung cancer screening. [place]  Chief Financial Officer. Screening for Lung Cancer: [place]  Therapist, Occupational. JAMA. 2021;325(10):962-970. CT of the abdomen and pelvis: No acute arterial abnormality is noted. Cholelithiasis without complicating factors. Diverticulosis without diverticulitis. Multiple chronic thoracic and lumbar compression deformity is noted. Electronically Signed   By: Oneil Devonshire M.D.   On: 10/14/2024 23:53   DG Chest 1 View Result Date: 10/14/2024 EXAM: 1 VIEW XRAY OF THE CHEST 10/14/2024 09:54:00 PM COMPARISON: Comparison with 09/27/2024. CLINICAL HISTORY: SOB. Pt reports increased SOB for the past week. Recently seen here 2 weeks ago for same problem. Pt reports O2 saturation dropping into 70s at home while ambulating on 2L. Pt presents tachypneic with increased work of breathing, two word answers only. Admitted for COPD exacerbation 2 weeks ago. FINDINGS: LUNGS AND PLEURA: No focal pulmonary opacity. No pulmonary edema. No pleural effusion. No pneumothorax. HEART AND MEDIASTINUM: No acute abnormality of the cardiac and mediastinal silhouettes. BONES AND SOFT TISSUES: No acute osseous abnormality.  IMPRESSION: 1. No acute cardiopulmonary process. Electronically signed by: Norman Gatlin MD 10/14/2024 09:58 PM EDT RP Workstation: [id number]    Microbiology: Results for orders placed or performed during the hospital encounter of 11/05/24  Resp panel by RT-PCR (RSV, Flu A&B, Covid) Anterior Nasal Swab     Status: None   Collection Time: 11/05/24 11:11 AM   Specimen: Anterior Nasal Swab  Result Value Ref Range Status   SARS Coronavirus 2 by RT PCR NEGATIVE NEGATIVE Final    Comment: (NOTE) SARS-CoV-2 target nucleic acids are NOT DETECTED.  The SARS-CoV-2 RNA is generally detectable in upper respiratory specimens during the acute phase of infection. The lowest concentration of SARS-CoV-2 viral copies this assay can detect is 138 copies/mL. A negative result does not preclude SARS-Cov-2 infection and should not be used as the sole basis for treatment or other  patient management decisions. A negative result may occur with  improper specimen collection/handling, submission of specimen other than nasopharyngeal swab, presence of viral mutation(s) within the areas targeted by this assay, and inadequate number of viral copies(<138 copies/mL). A negative result must be combined with clinical observations, patient history, and epidemiological information. The expected result is Negative.  Fact Sheet for Patients:  bloggercourse.com  Fact Sheet for Healthcare Providers:  seriousbroker.it  This test is no t yet approved or cleared by the [place]  FDA and  has been authorized for detection and/or diagnosis of SARS-CoV-2 by FDA under an Emergency Use Authorization (EUA). This EUA will remain  in effect (meaning this test can be used) for the duration of the COVID-19 declaration under Section 564(b)(1) of the Act, 21 U.S.C.section 360bbb-3(b)(1), unless the authorization is terminated  or revoked sooner.       Influenza A by PCR NEGATIVE NEGATIVE Final   Influenza B by PCR NEGATIVE NEGATIVE Final    Comment: (NOTE) The Xpert Xpress SARS-CoV-2/FLU/RSV plus assay is intended as an aid in the diagnosis of influenza from Nasopharyngeal swab specimens and should not be used as a sole basis for treatment. Nasal washings and aspirates are unacceptable for Xpert Xpress SARS-CoV-2/FLU/RSV testing.  Fact Sheet for Patients: bloggercourse.com  Fact Sheet for Healthcare Providers: seriousbroker.it  This test is not yet approved or cleared by the [place]  FDA and has been authorized for detection and/or diagnosis of SARS-CoV-2 by FDA under an Emergency Use Authorization (EUA). This EUA will remain in effect (meaning this test can be used) for the duration of the COVID-19 declaration under Section 564(b)(1) of the Act, 21 U.S.C. section  360bbb-3(b)(1), unless the authorization is terminated or revoked.     Resp Syncytial Virus by PCR NEGATIVE NEGATIVE Final    Comment: (NOTE) Fact Sheet for Patients: bloggercourse.com  Fact Sheet for Healthcare Providers: seriousbroker.it  This test is not yet approved or cleared by the [place]  FDA and has been authorized for detection and/or diagnosis of SARS-CoV-2 by FDA under an Emergency Use Authorization (EUA). This EUA will remain in effect (meaning this test can be used) for the duration of the COVID-19 declaration under Section 564(b)(1) of the Act, 21 U.S.C. section 360bbb-3(b)(1), unless the authorization is terminated or revoked.  Performed at [facility], [address] [place], [place] [address]   Respiratory (~20 pathogens) panel by PCR     Status: None   Collection Time: 11/05/24  9:43 PM   Specimen: Nasopharyngeal Swab; Respiratory  Result Value Ref Range Status   Adenovirus NOT DETECTED NOT DETECTED Final   Coronavirus  229E NOT DETECTED NOT DETECTED Final    Comment: (NOTE) The Coronavirus on the Respiratory Panel, DOES NOT test for the novel  Coronavirus (2019 nCoV)    Coronavirus HKU1 NOT DETECTED NOT DETECTED Final   Coronavirus NL63 NOT DETECTED NOT DETECTED Final   Coronavirus OC43 NOT DETECTED NOT DETECTED Final   Metapneumovirus NOT DETECTED NOT DETECTED Final   Rhinovirus / Enterovirus NOT DETECTED NOT DETECTED Final   Influenza A NOT DETECTED NOT DETECTED Final   Influenza B NOT DETECTED NOT DETECTED Final   Parainfluenza Virus 1 NOT DETECTED NOT DETECTED Final   Parainfluenza Virus 2 NOT DETECTED NOT DETECTED Final   Parainfluenza Virus 3 NOT DETECTED NOT DETECTED Final   Parainfluenza Virus 4 NOT DETECTED NOT DETECTED Final   Respiratory Syncytial Virus NOT DETECTED NOT DETECTED Final   Bordetella pertussis NOT DETECTED NOT DETECTED Final   Bordetella Parapertussis NOT  DETECTED NOT DETECTED Final   Chlamydophila pneumoniae NOT DETECTED NOT DETECTED Final   Mycoplasma pneumoniae NOT DETECTED NOT DETECTED Final    Comment: Performed at [facility], [address], [place], [place] [address]    Labs: CBC: Recent Labs  Lab 11/05/24 1111 11/06/24 0351 11/08/24 0340  WBC 15.0* 8.9 8.6  HGB 12.8* 11.4* 11.1*  HCT 42.7 37.8* 37.1*  MCV 80.7 80.6 81.0  PLT 264 239 227   Basic Metabolic Panel: Recent Labs  Lab 11/05/24 1111 11/05/24 1119 11/06/24 0351 11/08/24 0340  NA 143  --  142 141  K 3.7  --  4.7 4.8  CL 95*  --  98 99  CO2 36*  --  34* 34*  GLUCOSE 164*  --  212* 179*  BUN 16  --  21 33*  CREATININE 0.89  --  0.93 1.05  CALCIUM  9.5  --  9.4 9.1  MG  --  2.4  --   --   PHOS  --  2.2*  --   --    Liver Function Tests: Recent Labs  Lab 11/06/24 0351 11/08/24 0340  AST 27 27  ALT 63* 68*  ALKPHOS 72 75  BILITOT 0.3 0.3  PROT 6.3* 6.1*  ALBUMIN  3.6 3.7   CBG: Recent Labs  Lab 11/07/24 1150 11/07/24 1622 11/07/24 2210 11/08/24 0743 11/08/24 1154  GLUCAP 120* 188* 148* 120* 130*    Discharge time spent: approximately 45 minutes spent on discharge counseling, evaluation of patient on day of discharge, and coordination of discharge planning with nursing, social work, pharmacy and case management  Signed: Lonni SHAUNNA Dalton, MD [facility] 11/08/2024

## 2024-11-08 NOTE — Progress Notes (Signed)
 Mobility Specialist - Progress Note  ([facility] 2L) Pre-mobility: 86 bpm HR, 100% SpO2 During mobility: 91-99% SpO2 Post-mobility: 95 bpm HR, 90% SPO2   11/08/24 1025  Mobility  Activity Ambulated independently  Level of Assistance Independent after set-up  Assistive Device None  Distance Ambulated (ft) 700 ft  Range of Motion/Exercises Active  Activity Response Tolerated well  Mobility visit 1 Mobility  Mobility Specialist Start Time (ACUTE ONLY) 1010  Mobility Specialist Stop Time (ACUTE ONLY) 1025  Mobility Specialist Time Calculation (min) (ACUTE ONLY) 15 min   Pt was found on recliner chair and agreeable to mobilize. Grew SOB with progression of session and had audible wheezing. At EOS returned to recliner chair with all needs met. Call bell in reach.   [name],  Mobility Specialist Can be reached via Secure Chat

## 2024-11-08 NOTE — Progress Notes (Addendum)
 Discharge instructions given to patient and wife questions asked and answered. Discharge medication delivered to patient at the bedside in a secure bag

## 2024-11-08 NOTE — Progress Notes (Signed)
 Transition of Care [facility]) - Inpatient Brief Assessment  Patient Details  Name: KAYLUB DETIENNE MRN: [id number] Date of Birth: 04-04-1946  Transition of Care [facility]) CM/SW Contact:    Duwaine GORMAN Aran, LCSW Phone Number: 11/08/2024, 9:44 AM  Clinical Narrative: Patient walked 720ft with PT. No care management needs identified at this time.  Transition of Care Asessment: Insurance and Status: Insurance coverage has been reviewed Patient has primary care physician: Yes Home environment has been reviewed: Resides in home with spouse Prior level of function:: Independent with ADLs at baseline Prior/Current Home Services: No current home services Social Drivers of Health Review: SDOH reviewed no interventions necessary Readmission risk has been reviewed: Yes Transition of care needs: no transition of care needs at this time   11/08/24 0944  Readmission Prevention Plan - Extreme Risk  Transportation Screening Complete  Medication Review Complete  Home Care Consult Complete  Social Work consult for recovery care planning/counseling (includes patient and caregiver) Complete  Palliative Care Screening Not Applicable  Skilled Nursing Facility Not Applicable

## 2024-11-09 ENCOUNTER — Telehealth: Payer: Self-pay

## 2024-11-09 NOTE — Transitions of Care (Post Inpatient/ED Visit) (Signed)
   11/09/2024  Name: KEYLAN COSTABILE MRN: [id number] DOB: June 20, 1946  Today's TOC FU Call Status: Today's TOC FU Call Status:: Successful TOC FU Call Completed TOC FU Call Complete Date: 11/09/24 [facility] RN briefly spoke with patient who states he does not feel he needs to participate in [facility] and has already gone over everything)  Patient's Name and Date of Birth confirmed. DOB, Name  Transition Care Management Follow-up Telephone Call Date of Discharge: 11/08/24 Discharge Facility: [facility]) Type of Discharge: Inpatient Admission Primary Inpatient Discharge Diagnosis:: Acute on chronic respiratory failure with hypoxia and hypercapnia Shona Prow RN, CCM [facility]  VBCI-Population Health RN Care Manager [phone]

## 2024-11-12 ENCOUNTER — Inpatient Hospital Stay: Admitting: Physician Assistant

## 2024-11-15 ENCOUNTER — Ambulatory Visit (INDEPENDENT_AMBULATORY_CARE_PROVIDER_SITE_OTHER): Admitting: Adult Health

## 2024-11-15 ENCOUNTER — Encounter: Payer: Self-pay | Admitting: Adult Health

## 2024-11-15 VITALS — BP 146/52 | HR 85 | Temp 98.8°F | Ht 65.0 in | Wt 155.6 lb

## 2024-11-15 DIAGNOSIS — G4733 Obstructive sleep apnea (adult) (pediatric): Secondary | ICD-10-CM | POA: Diagnosis not present

## 2024-11-15 DIAGNOSIS — J9611 Chronic respiratory failure with hypoxia: Secondary | ICD-10-CM | POA: Diagnosis not present

## 2024-11-15 DIAGNOSIS — J441 Chronic obstructive pulmonary disease with (acute) exacerbation: Secondary | ICD-10-CM | POA: Diagnosis not present

## 2024-11-15 DIAGNOSIS — R1310 Dysphagia, unspecified: Secondary | ICD-10-CM

## 2024-11-15 DIAGNOSIS — R5381 Other malaise: Secondary | ICD-10-CM

## 2024-11-15 DIAGNOSIS — I5032 Chronic diastolic (congestive) heart failure: Secondary | ICD-10-CM | POA: Diagnosis not present

## 2024-11-15 MED ORDER — OHTUVAYRE 3 MG/2.5ML IN SUSP: 1.0000 | Freq: Two times a day (BID) | RESPIRATORY_TRACT | Status: AC

## 2024-11-15 NOTE — Progress Notes (Signed)
 @Patient  ID: Paul [name], male    DOB: [birth year], 78 y.o.   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  (baseline 10 mg daily), chronic respiratory failure on oxygen , sleep apnea on CPAP with oxygen  Prior to recurrent COPD exacerbations and hospitalizations Medical history significant for diastolic heart failure, A-fib not on anticoagulation therapy due to severe bleeding secondary to hematuria and balance issues     TEST/EVENTS : Reviewed 11/15/2024  Intolerant to Daliresp -GI side effects COVID-19 infection January 2022 requiring hospitalization Hospitalization February 2023 for rhinovirus with COPD exacerbation Hospitalization March 2023 and June 2023 for COPD exacerbation Hospitalization March 2024 COPD exacerbation March 2023 outpatient ABG without hypercarbia unable to qualify for NIV or BiPAP PFTs 01/2024 show stable lung function compared to 2017 with FEV1 at 33%, ratio 49, FVC 48%, diffusing capacity 41%. Some decline in DLCO  CT chest October 14, 2024 pulmonary emphysema no nodules noted, no acute process ER /Hospitalization -08/26/24, 09/06/24, 09/27/24, 10/14/24 , 10/20/24, 11/05/24  Echo 10/29/24 EF 60-65%, RV size nml  Discussed the use of AI scribe software for clinical note transcription with the patient, who gave verbal consent to proceed.  History of Present Illness Paul [name] is a 78 year old male with chronic respiratory issues who presents with recent hospitalizations for recurrent COPD exacerbations.  He is accompanied by his wife, who is his primary caregiver.  He has experienced severe breathing difficulties leading to six hospitalizations in the last two months. Despite using maximum maintenance regimen including Brovana , budesonide , Yupelri , albuterol , oxygen   and CPAP therapy he often finds himself unable to breathe, necessitating hospital visits.   His current medication regimen includes budesonide  and Brovana  twice daily, yUpelri  once daily at 2 PM, and albuterol  as needed for flare-ups. He is no longer using Breztri . He is also on prednisone , currently tapering from 25 mg since recent hospital discharge., and uses a CPAP machine daily, including during naps.  He remains on oxygen  2 to 3 L with activity and at bedtime.  Palliative care is following at home.  Concern for possible anxiety component. Sertraline , which has been increased to 100 mg at night, and alprazolam , which he uses as needed for anxiety. The alprazolam  helps him relax and sleep better.   Hospital records have been reviewed in detail.  CT chest October 14, 2024 showed diffuse emphysematous changes without acute process.  Viral panel has remained negative.  Recent echo showed preserved EF, normal right ventricular size and function Blood work showed stable anemia and slightly elevated liver function tests. Last hospitalization patient was treated with aggressive IV antibiotics and nebulized bronchodilators.  Was discussed on starting azithromycin  3 days a week however patient is on digoxin .  Patient was started on Nucala  in early September 2 weeks ago due to multiple exacerbations after starting injection.    He has implemented environmental controls at home, including an air purifier and humidifier, to reduce dust and maintain humidity levels. No new exposures to pets or mold.     Allergies  Allergen Reactions   Tape Other (See Comments)    PLEASE USE COBAN WRAP IN LIEU OF TAPE, as it pulls off the skin!!   Daliresp  [Roflumilast ] Diarrhea and Nausea And Vomiting   Levaquin  [Levofloxacin ] Palpitations and Other (See Comments)  Made the B/P fluctuate and heart raced   Alendronate Anxiety and Other (See Comments)    Jittery/nervous    Immunization History  Administered  Date(s) Administered   Fluad Quad(high Dose 65+) 10/02/2020, 10/09/2021, 09/05/2022, 10/17/2022   Fluad Trivalent(High Dose 65+) 10/30/2023   INFLUENZA, HIGH DOSE SEASONAL PF 09/02/2016, 09/10/2017, 08/31/2018, 09/24/2018, 10/21/2019, 09/16/2024   Influenza Split 09/22/2013, 09/01/2015   Influenza Whole 09/04/2011, 09/21/2012   Influenza-Unspecified 08/23/2014   Moderna Covid-19 Vaccine Bivalent Booster 40yrs & up 10/09/2021   Moderna SARS-COV2 Booster Vaccination 07/05/2021   Moderna Sars-Covid-2 Vaccination 01/17/2020, 02/14/2020, 10/06/2020   PNEUMOCOCCAL CONJUGATE-20 12/05/2022   Pfizer(Comirnaty)Fall Seasonal Vaccine 12 years and older 10/31/2022, 10/30/2023   Pneumococcal Conjugate-13 01/11/2014, 12/09/2016   Pneumococcal Polysaccharide-23 08/23/2010, 08/29/2011   Respiratory Syncytial Virus Vaccine,Recomb Aduvanted(Arexvy) 09/23/2022   Tdap 02/01/2019   Zoster Recombinant(Shingrix) 04/04/2014   Zoster, Live 04/05/2014    Past Medical History:  Diagnosis Date   Anemia    Aortic atherosclerosis    Aspiration pneumonia due to inhalation of vomitus (HCC) 02/03/2022   Atrial fibrillation (HCC)    Back pain    COPD (chronic obstructive pulmonary disease) (HCC)    COPD exacerbation (HCC) 12/27/2021   COVID-19 12/24/2020   Diabetes mellitus without complication (HCC)    Dizziness 04/08/2022   Elevated PSA    GAD (generalized anxiety disorder)    GERD (gastroesophageal reflux disease)    High cholesterol    Hypertension    Lingular pneumonia 01/05/2021   See cxr 01/06/20 rx with zpak/ cefipime x 5 days and then developed purulent sputum again 01/12/21 so rx levcaquin 500 mg daily x 7 days then return for cxr before more    Lung nodule < 6cm on CT 05/29/2016   Osteoporosis    Oxygen  deficiency    Pneumonia    January 2022   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 Cindie with a 20 mm Watchman FLX device   RSV bronchitis  01/27/2022   Vertebral compression fracture (HCC) 05/29/2019   T8 compression fracture noted on CT scan 05/28/2019 inpatient with chronic steroid dependent COPD - rx calcitonin nasal spray rx per PCP     Tobacco History: Social History   Tobacco Use  Smoking Status Former   Current packs/day: 0.00   Average packs/day: 2.0 packs/day for 52.0 years (104.0 ttl pk-yrs)   Types: Cigarettes   Start date: 02/03/1957   Quit date: 02/03/2009   Years since quitting: 15.7  Smokeless Tobacco Never  Tobacco Comments   Counseled to remain smoke free   Counseling given: Not Answered Tobacco comments: Counseled to remain smoke free   Outpatient Medications Prior to Visit  Medication Sig Dispense Refill   albuterol  (VENTOLIN  HFA) 108 (90 Base) MCG/ACT inhaler Inhale 1-2 puffs into the lungs every 6 (six) hours as needed for wheezing or shortness of breath. 18 g 2   ALPRAZolam  (XANAX ) 0.5 MG tablet Take 1 tablet (0.5 mg total) by mouth 2 (two) times daily as needed for anxiety. F41.1 30 tablet 0   amLODipine  (NORVASC ) 5 MG tablet Take 1 tablet (5 mg total) by mouth daily. 30 tablet 1   arformoterol  (BROVANA ) 15 MCG/2ML NEBU Take 2 mLs (15 mcg total) by nebulization 2 (two) times daily. 120 mL 11   Ascorbic Acid  (VITAMIN C ) 1000 MG tablet Take 1,000 mg by mouth daily.     aspirin  EC 81 MG tablet Take 1 tablet (81 mg total) by  mouth daily. Swallow whole. 90 tablet 3   atorvastatin  (LIPITOR) 40 MG tablet TAKE 1 TABLET BY MOUTH EVERYDAY AT BEDTIME 90 tablet 1   budesonide  (PULMICORT ) 0.25 MG/2ML nebulizer solution Take 2 mLs (0.25 mg total) by nebulization 2 (two) times daily. 60 mL 12   CALCIUM  PO Take 1 tablet by mouth daily.     Cholecalciferol  (VITAMIN D3) 50 MCG (2000 UT) TABS Take 2,000 Units by mouth in the morning.     dapagliflozin  propanediol (FARXIGA ) 10 MG TABS tablet Take 1 tablet (10 mg total) by mouth daily. 90 tablet 4   denosumab  (PROLIA ) 60 MG/ML SOSY injection Inject 60 mg into the skin  every 6 (six) months. 180 mL 0   digoxin  (LANOXIN ) 0.125 MG tablet TAKE 1 TABLET BY MOUTH DAILY 90 tablet 2   ferrous sulfate  325 (65 FE) MG tablet Take 325 mg by mouth 2 (two) times daily with a meal.     furosemide  (LASIX ) 40 MG tablet Take 1 tablet (40 mg total) by mouth daily. 30 tablet 1   Guaifenesin  (MUCINEX  MAXIMUM STRENGTH) 1200 MG TB12 Take 1,200 mg by mouth daily.     levothyroxine  (SYNTHROID ) 25 MCG tablet Take 1 tablet (25 mcg total) by mouth daily. 90 tablet 3   Melatonin 12 MG TABS Take 12 mg by mouth at bedtime.     Multiple Vitamin (MULTIVITAMIN WITH MINERALS) TABS tablet Take 1 tablet by mouth daily with breakfast.     Omega-3 Fatty Acids  (FISH OIL) 1200 MG CAPS Take 1,200 mg by mouth at bedtime.     OXYGEN  Inhale 2-3 L/min into the lungs continuous.     pantoprazole  (PROTONIX ) 40 MG tablet Take 1 tablet (40 mg total) by mouth 2 (two) times daily. 180 tablet 3   predniSONE  (DELTASONE ) 10 MG tablet Take 4 tablets by mouth daily for 1 day, THEN 3 tablets daily for 3 days, THEN 2 tablets daily for 3 days, THEN 1 tablet daily for 3 days. 22 tablet 0   Probiotic Product (PROBIOTIC DAILY) CAPS Take 1 capsule by mouth in the morning.     revefenacin  (YUPELRI ) 175 MCG/3ML nebulizer solution Take 3 mLs (175 mcg total) by nebulization daily. 90 mL 11   sertraline  (ZOLOFT ) 100 MG tablet Take 1 tablet (100 mg total) by mouth daily. 90 tablet 0   Accu-Chek Softclix Lancets lancets Check fasting sugar once daily. (Patient not taking: Reported on 11/15/2024) 100 each 3   albuterol  (PROVENTIL ) (2.5 MG/3ML) 0.083% nebulizer solution Take 3 mLs (2.5 mg total) by nebulization every 6 (six) hours as needed for shortness of breath or wheezing. (Patient not taking: Reported on 11/15/2024) 360 mL 5   Glucose Blood (BLOOD GLUCOSE TEST STRIPS) STRP Use to test blood sugar up to twice daily. May substitute to any manufacturer covered by patient's insurance. E11.0 (Patient not taking: Reported on 11/15/2024)  100 strip 3   Lancets Misc. MISC Use to test blood sugar up to twice daily. May substitute to any manufacturer covered by patient's insurance. E11.0 (Patient not taking: Reported on 11/15/2024) 100 each 3   No facility-administered medications prior to visit.     Review of Systems:   Constitutional:   No  weight loss, night sweats,  Fevers, chills,+fatigue, or  lassitude.  HEENT:   No headaches,  Difficulty swallowing,  Tooth/dental problems, or  Sore throat,                No sneezing, itching, ear ache, nasal congestion, post  nasal drip,   CV:  No chest pain,  Orthopnea, PND, swelling in lower extremities, anasarca, dizziness, palpitations, syncope.   GI  No heartburn, indigestion, abdominal pain, nausea, vomiting, diarrhea, change in bowel habits, loss of appetite, bloody stools.   Resp:  No chest wall deformity  Skin: no rash or lesions.  GU: no dysuria, change in color of urine, no urgency or frequency.  No flank pain, no hematuria   MS:  No joint pain or swelling.  No decreased range of motion.  No back pain.    Physical Exam  BP (!) 146/52 Comment: Rechecked due to elevated BP at initial reading.  Pulse 85   Temp 98.8 F (37.1 C)   Ht 5' 5 (1.651 m) Comment: Per pt  Wt 155 lb 9.6 oz (70.6 kg)   SpO2 97% Comment: RA  BMI 25.89 kg/m   GEN: A/Ox3; pleasant , NAD, chronically ill-appearing, on oxygen    HEENT:  [place]/AT,  NOSE-clear, THROAT-clear, no lesions, no postnasal drip or exudate noted.   NECK:  Supple w/ fair ROM; no JVD; normal carotid impulses w/o bruits; no thyromegaly or nodules palpated; no lymphadenopathy.    RESP  Clear  P & A; w/o, wheezes/ rales/ or rhonchi. no accessory muscle use, no dullness to percussion  CARD:  RRR, no m/r/g, no peripheral edema, pulses intact, no cyanosis or clubbing.  GI:   Soft & nt; nml bowel sounds; no organomegaly or masses detected.   Musco: Warm bil, no deformities or joint swelling noted.   Neuro: alert, no focal  deficits noted.    Skin: Warm, no lesions or rashes    Lab Results:Reviewed 11/15/2024   CBC    Component Value Date/Time   WBC 8.6 11/08/2024 0340   RBC 4.58 11/08/2024 0340   HGB 11.1 (L) 11/08/2024 0340   HGB 12.9 (L) 10/28/2024 1507   HCT 37.1 (L) 11/08/2024 0340   HCT 42.4 10/28/2024 1507   PLT 227 11/08/2024 0340   PLT 290 10/28/2024 1507   MCV 81.0 11/08/2024 0340   MCV 80 10/28/2024 1507   MCH 24.2 (L) 11/08/2024 0340   MCHC 29.9 (L) 11/08/2024 0340   RDW 16.6 (H) 11/08/2024 0340   RDW 16.6 (H) 10/28/2024 1507   LYMPHSABS 0.4 (L) 10/28/2024 1507   MONOABS 0.4 10/21/2024 0233   EOSABS 0.0 10/28/2024 1507   BASOSABS 0.0 10/28/2024 1507    BMET    Component Value Date/Time   NA 141 11/08/2024 0340   NA 142 10/28/2024 1507   K 4.8 11/08/2024 0340   CL 99 11/08/2024 0340   CO2 34 (H) 11/08/2024 0340   GLUCOSE 179 (H) 11/08/2024 0340   BUN 33 (H) 11/08/2024 0340   BUN 21 10/28/2024 1507   CREATININE 1.05 11/08/2024 0340   CALCIUM  9.1 11/08/2024 0340   GFRNONAA >60 11/08/2024 0340   GFRAA 107 11/20/2020 1202    BNP    Component Value Date/Time   BNP 100.8 (H) 05/12/2023 1626    ProBNP    Component Value Date/Time   PROBNP 181.0 11/05/2024 1111   PROBNP 194 10/04/2024 0919    Imaging: DG Chest 2 View Result Date: 11/05/2024 EXAM: 2 VIEW(S) XRAY OF THE CHEST 11/05/2024 11:24:00 AM COMPARISON: 16 days ago. CLINICAL HISTORY: SOb. FINDINGS: LUNGS AND PLEURA: No focal pulmonary opacity. No pleural effusion. No pneumothorax. HEART AND MEDIASTINUM: No acute abnormality of the cardiac and mediastinal silhouettes. BONES AND SOFT TISSUES: No acute osseous abnormality. IMPRESSION: 1. No acute  cardiopulmonary disease. Electronically signed by: Lynwood Seip MD 11/05/2024 11:40 AM EST RP Workstation: [id number]   ECHOCARDIOGRAM COMPLETE Result Date: 10/29/2024    ECHOCARDIOGRAM REPORT   Patient Name:   Paul [name] Date of Exam: 10/29/2024 Medical Rec #:   [id number]      Height:       65.0 in Accession #:    [id number]     Weight:       155.0 lb Date of Birth:  07-10-1946       BSA:          1.775 m Patient Age:    78 years       BP:           138/62 mmHg Patient Gender: M              HR:           95 bpm. Exam Location:  [facility] Procedure: 2D Echo, Cardiac Doppler and Color Doppler (Both Spectral and Color            Flow Doppler were utilized during procedure). Indications:    Chronic diastolic CHF (congestive heart failure) (HCC) [I50.32                 (ICD-10-CM)]  History:        Patient has prior history of Echocardiogram examinations, most                 recent 01/26/2023. CAD, COPD, Arrythmias:Atrial Fibrillation,                 Signs/Symptoms:Dyspnea; Risk Factors:Diabetes, Hypertension and                 Dyslipidemia. Oxygen  dependant. 20mm Watchman Device implanted                 08/02/21.  Sonographer:    Charlie Jointer RDCS Referring Phys: [phone] [facility] S [place]  Sonographer Comments: Global longitudinal strain was attempted. IMPRESSIONS  1. TDS, limited study. Left ventricular ejection fraction, by estimation, is 60 to 65%. The left ventricle has normal function. The left ventricle has no regional wall motion abnormalities. There is mild left ventricular hypertrophy. Left ventricular diastolic parameters are indeterminate.  2. Right ventricular systolic function is normal. The right ventricular size is normal.  3. The mitral valve is normal in structure. No evidence of mitral valve regurgitation. No evidence of mitral stenosis.  4. The aortic valve is normal in structure. Aortic valve regurgitation is not visualized. No aortic stenosis is present.  5. The inferior vena cava is normal in size with greater than 50% respiratory variability, suggesting right atrial pressure of 3 mmHg. FINDINGS  Left Ventricle: TDS, limited study. Left ventricular ejection fraction, by estimation, is 60 to 65%. The left ventricle has normal function. The left ventricle has  no regional wall motion abnormalities. The left ventricular internal cavity size was normal in size. There is mild left ventricular hypertrophy. Left ventricular diastolic parameters are indeterminate. Right Ventricle: The right ventricular size is normal. No increase in right ventricular wall thickness. Right ventricular systolic function is normal. Left Atrium: Left atrial size was normal in size. Right Atrium: Right atrial size was normal in size. Pericardium: There is no evidence of pericardial effusion. Mitral Valve: The mitral valve is normal in structure. Mild mitral annular calcification. No evidence of mitral valve regurgitation. No evidence of mitral valve stenosis. Tricuspid Valve: The tricuspid valve is normal in structure. Tricuspid valve regurgitation  is not demonstrated. No evidence of tricuspid stenosis. Aortic Valve: The aortic valve is normal in structure. Aortic valve regurgitation is not visualized. No aortic stenosis is present. Pulmonic Valve: The pulmonic valve was normal in structure. Pulmonic valve regurgitation is not visualized. No evidence of pulmonic stenosis. Aorta: The aortic root is normal in size and structure. Venous: The inferior vena cava is normal in size with greater than 50% respiratory variability, suggesting right atrial pressure of 3 mmHg. IAS/Shunts: No atrial level shunt detected by color flow Doppler.  LEFT VENTRICLE PLAX 2D LVIDd:         3.85 cm   Diastology LVIDs:         2.85 cm   LV e' medial:    8.73 cm/s LV PW:         1.05 cm   LV E/e' medial:  9.9 LV IVS:        1.20 cm   LV e' lateral:   8.81 cm/s LVOT diam:     1.80 cm   LV E/e' lateral: 9.9 LV SV:         45 LV SV Index:   25 LVOT Area:     2.54 cm  RIGHT VENTRICLE             IVC RV Basal diam:  3.60 cm     IVC diam: 1.70 cm RV Mid diam:    3.40 cm RV S prime:     11.60 cm/s TAPSE (M-mode): 2.1 cm LEFT ATRIUM             Index        RIGHT ATRIUM           Index LA diam:        2.95 cm 1.66 cm/m   RA Area:      11.00 cm LA Vol (A2C):   18.1 ml 10.20 ml/m  RA Volume:   22.60 ml  12.73 ml/m LA Vol (A4C):   31.6 ml 17.80 ml/m LA Biplane Vol: 23.9 ml 13.46 ml/m  AORTIC VALVE LVOT Vmax:   101.17 cm/s LVOT Vmean:  67.633 cm/s LVOT VTI:    0.176 m  AORTA Ao Root diam: 3.15 cm Ao Asc diam:  2.60 cm Ao Desc diam: 2.10 cm MITRAL VALVE MV Area (PHT): 3.92 cm    SHUNTS MV Decel Time: 194 msec    Systemic VTI:  0.18 m MV E velocity: 86.80 cm/s  Systemic Diam: 1.80 cm MV A velocity: 92.50 cm/s MV E/A ratio:  0.94 Lamar Fitch MD Electronically signed by Lamar Fitch MD Signature Date/Time: 10/29/2024/12:55:31 PM    Final    DG Chest Port 1 View Result Date: 10/20/2024 EXAM: 1 VIEW(S) XRAY OF THE CHEST 10/20/2024 08:57:47 AM COMPARISON: 10/14/2024 CLINICAL HISTORY: Shortness of breath. Reports SHOB since this morning. Discharged from [place] yesterday for COPD exacerbation. States was sent home on 2L [place] Denies chest pain, dizziness, N/V, fevers RRT in triage FINDINGS: LUNGS AND PLEURA: Lung hyperinflation. No focal pulmonary opacity. No pulmonary edema. No pleural effusion. No pneumothorax. HEART AND MEDIASTINUM: Atherosclerotic calcifications. Left atrial appendage occlusion device noted. No acute abnormality of the cardiac and mediastinal silhouettes. BONES AND SOFT TISSUES: No acute osseous abnormality. IMPRESSION: 1. Lung hyperinflation, consistent with COPD. 2. No acute findings. Electronically signed by: Evalene Coho MD 10/20/2024 09:17 AM EDT RP Workstation: [id number]    denosumab  (PROLIA ) injection 60 mg     Date Action Dose Route User   Discharged  on 11/08/2024   Admitted on 11/05/2024   11/04/2024 1424 Given 60 mg Subcutaneous (Right Arm) Twilla Dodrill, CMA      mepolizumab  (NUCALA ) injection 100 mg     Date Action Dose Route User   Discharged on 11/08/2024   Admitted on 11/05/2024   Discharged on 10/24/2024   Admitted on 10/20/2024   Discharged on 10/19/2024   Admitted on  10/14/2024   10/01/2024 0846 Given 100 mg Subcutaneous (Left Arm) Batten, Laura E, LPN          Latest Ref Rng & Units 02/16/2024    8:56 AM 05/29/2016    9:15 AM  PFT Results  FVC-Pre L 1.64  1.92   FVC-Predicted Pre % 48  52   FVC-Post L 1.54    FVC-Predicted Post % 45    Pre FEV1/FVC % % 49  41   Post FEV1/FCV % % 48    FEV1-Pre L 0.80  0.78   FEV1-Predicted Pre % 33  29   FEV1-Post L 0.74    DLCO uncorrected ml/min/mmHg 8.82    DLCO UNC% % 41    DLCO corrected ml/min/mmHg 8.82    DLCO COR %Predicted % 41    DLVA Predicted % 62    TLC L 6.68    TLC % Predicted % 108    RV % Predicted % 181      No results found for: NITRICOXIDE      No data to display              Assessment & Plan:   Assessment and Plan Assessment & Plan Chronic obstructive pulmonary disease with acute exacerbation  -recurrent exacerbation He has recurrent hospitalizations due to COPD exacerbations, hospitalized 6 times in the last 2 months.  He is on budesonide  and brovana  twice daily, and uses albuterol  as needed.  Remains on Yupelri  daily.  Has albuterol  to use as needed.  Prednisone  is being tapered from 25 mg to 15 mg.  Anxiety may be a contributing factor as well.  . A previous Nucala  trial was ineffective, was stopped 2 weeks ago.  May need to look at Dupixent going forward.  At this time would hold off on Azithromycin  is not recommended due to interaction with digoxin .  Will try  Ohtuvayre  neb Twice daily  . He should continue budesonide  and brovana  twice daily, use albuterol  as needed, and taper prednisone  to 15 mg and hold. He is referred to pulmonary rehab at [facility] and should continue oxygen  therapy as needed. CPAP should be used during daytime naps and when experiencing dyspnea.  Could consider changing CPAP to bilevel support however in the past ABG in steady outpatient state did not show pCO2 greater than 50 and therefore did not qualify for BiPAP.  Consider repeat ABG going  forward.  Generalized anxiety disorder  -appears stable Anxiety may contributing factor COPD exacerbations. He is currently on sertraline , , and uses alprazolam  as needed. Anxiety he may be exacerbated by prednisone  use, He should continue sertraline  100 mg at night and use alprazolam  as needed for anxiety.  Stress reducers discussed in detail.  Continue to taper prednisone  as able  Obstructive sleep apnea.  Optimally controlled on CPAP therapy.  Continue on CPAP with oxygen  at bedtime and with naps.  Also may use CPAP if having difficulty breathing as tolerated.  Physical deconditioning.  Refer to pulmonary rehab at [facility] pulmonary rehab  GERD and dysphagia-continue with current regimen.  Aspiration precautions.  Diastolic  heart failure.  Appears compensated.  Continue on current regimen.  Recent echo appears stable.  Plan  Patient Instructions  Begin Ohtuvayre  Neb Twice daily.  Continue on Budesonide  and Brovana  Neb Twice daily  and Yupelri  Neb daily .  Taper Prednisone  as directed to 15mg  daily -hold at this dose.  Mucinex  Twice daily  As needed  Cough/congestion  Flutter valve Three times a day  .  Albuterol  Neb or inhaler as needed  Wear Oxygen  2-3 l/m  with activity and At bedtime   to maintain O2 sats >88-90%.  Activity as tolerated.  Aspirations precautions as discussed.  Daily weights Continue on Lasix  as directed.  Continue with home palliative care  Refer to [facility]  Continue on CPAP At bedtime with Oxygen  2l/m  and with daily naps and with increased shortness of breath if needed Follow up in 1 week and As needed   Please contact office for sooner follow up if symptoms do not improve or worsen or seek emergency care             Ronon Ferger, NP 11/15/2024  I spent 42    minutes dedicated to the care of this patient on the date of this encounter to include pre-visit review of records, face-to-face time with the patient  discussing conditions above, post visit ordering of testing, clinical documentation with the electronic health record, making appropriate referrals as documented, and communicating necessary findings to members of the patients care team.

## 2024-11-15 NOTE — Patient Instructions (Addendum)
 Begin Ohtuvayre  Neb Twice daily.  Continue on Budesonide  and Brovana  Neb Twice daily  and Yupelri  Neb daily .  Taper Prednisone  as directed to 15mg  daily -hold at this dose.  Mucinex  Twice daily  As needed  Cough/congestion  Flutter valve Three times a day  .  Albuterol  Neb or inhaler as needed  Wear Oxygen  2-3 l/m  with activity and At bedtime   to maintain O2 sats >88-90%.  Activity as tolerated.  Aspirations precautions as discussed.  Daily weights Continue on Lasix  as directed.  Continue with home palliative care  Refer to [facility]  Continue on CPAP At bedtime with Oxygen  2l/m  and with daily naps and with increased shortness of breath if needed Follow up in 1 week and As needed   Please contact office for sooner follow up if symptoms do not improve or worsen or seek emergency care

## 2024-11-16 ENCOUNTER — Other Ambulatory Visit: Payer: Self-pay

## 2024-11-16 DIAGNOSIS — J449 Chronic obstructive pulmonary disease, unspecified: Secondary | ICD-10-CM

## 2024-11-17 ENCOUNTER — Telehealth: Payer: Self-pay

## 2024-11-17 NOTE — Telephone Encounter (Signed)
 Per oV note from 11/15/24, plan is to continue to hold off on Nucala . Closing encounter. Pharmacy team can assist with reinitiation of benefits investigation in future if needed

## 2024-11-17 NOTE — Telephone Encounter (Signed)
 Received referral for new start Ohtuvayre . Opening benefits investigation in this thread.  If pharmacy team did not receive paperwork, will need to forward to clinical team for assistance with obtaining enrollment form.

## 2024-11-19 ENCOUNTER — Other Ambulatory Visit (HOSPITAL_BASED_OUTPATIENT_CLINIC_OR_DEPARTMENT_OTHER): Payer: Self-pay

## 2024-11-19 ENCOUNTER — Other Ambulatory Visit (HOSPITAL_COMMUNITY): Payer: Self-pay

## 2024-11-20 ENCOUNTER — Other Ambulatory Visit: Payer: Self-pay

## 2024-11-20 ENCOUNTER — Observation Stay (HOSPITAL_BASED_OUTPATIENT_CLINIC_OR_DEPARTMENT_OTHER)
Admission: EM | Admit: 2024-11-20 | Discharge: 2024-11-24 | DRG: 189 | Disposition: A | Attending: Internal Medicine | Admitting: Internal Medicine

## 2024-11-20 ENCOUNTER — Emergency Department (HOSPITAL_BASED_OUTPATIENT_CLINIC_OR_DEPARTMENT_OTHER)

## 2024-11-20 ENCOUNTER — Encounter (HOSPITAL_BASED_OUTPATIENT_CLINIC_OR_DEPARTMENT_OTHER): Payer: Self-pay | Admitting: Emergency Medicine

## 2024-11-20 DIAGNOSIS — Z7982 Long term (current) use of aspirin: Secondary | ICD-10-CM | POA: Diagnosis not present

## 2024-11-20 DIAGNOSIS — R0603 Acute respiratory distress: Secondary | ICD-10-CM

## 2024-11-20 DIAGNOSIS — M8008XA Age-related osteoporosis with current pathological fracture, vertebra(e), initial encounter for fracture: Secondary | ICD-10-CM | POA: Diagnosis present

## 2024-11-20 DIAGNOSIS — J441 Chronic obstructive pulmonary disease with (acute) exacerbation: Principal | ICD-10-CM | POA: Diagnosis present

## 2024-11-20 DIAGNOSIS — I5032 Chronic diastolic (congestive) heart failure: Secondary | ICD-10-CM | POA: Diagnosis present

## 2024-11-20 DIAGNOSIS — Z95818 Presence of other cardiac implants and grafts: Secondary | ICD-10-CM | POA: Diagnosis not present

## 2024-11-20 DIAGNOSIS — E1169 Type 2 diabetes mellitus with other specified complication: Secondary | ICD-10-CM | POA: Diagnosis present

## 2024-11-20 DIAGNOSIS — Z7984 Long term (current) use of oral hypoglycemic drugs: Secondary | ICD-10-CM | POA: Diagnosis not present

## 2024-11-20 DIAGNOSIS — R0689 Other abnormalities of breathing: Secondary | ICD-10-CM | POA: Diagnosis not present

## 2024-11-20 DIAGNOSIS — R0902 Hypoxemia: Secondary | ICD-10-CM | POA: Diagnosis not present

## 2024-11-20 DIAGNOSIS — Z79899 Other long term (current) drug therapy: Secondary | ICD-10-CM | POA: Diagnosis not present

## 2024-11-20 DIAGNOSIS — E870 Hyperosmolality and hypernatremia: Secondary | ICD-10-CM | POA: Diagnosis present

## 2024-11-20 DIAGNOSIS — E039 Hypothyroidism, unspecified: Secondary | ICD-10-CM | POA: Diagnosis present

## 2024-11-20 DIAGNOSIS — I480 Paroxysmal atrial fibrillation: Secondary | ICD-10-CM | POA: Diagnosis present

## 2024-11-20 DIAGNOSIS — Z9981 Dependence on supplemental oxygen: Secondary | ICD-10-CM | POA: Diagnosis not present

## 2024-11-20 DIAGNOSIS — J439 Emphysema, unspecified: Secondary | ICD-10-CM | POA: Diagnosis present

## 2024-11-20 DIAGNOSIS — Z8249 Family history of ischemic heart disease and other diseases of the circulatory system: Secondary | ICD-10-CM | POA: Diagnosis not present

## 2024-11-20 DIAGNOSIS — F411 Generalized anxiety disorder: Secondary | ICD-10-CM | POA: Insufficient documentation

## 2024-11-20 DIAGNOSIS — I10 Essential (primary) hypertension: Secondary | ICD-10-CM | POA: Diagnosis not present

## 2024-11-20 DIAGNOSIS — K219 Gastro-esophageal reflux disease without esophagitis: Secondary | ICD-10-CM | POA: Insufficient documentation

## 2024-11-20 DIAGNOSIS — Z87891 Personal history of nicotine dependence: Secondary | ICD-10-CM | POA: Diagnosis not present

## 2024-11-20 DIAGNOSIS — G4733 Obstructive sleep apnea (adult) (pediatric): Secondary | ICD-10-CM

## 2024-11-20 DIAGNOSIS — J9621 Acute and chronic respiratory failure with hypoxia: Secondary | ICD-10-CM | POA: Diagnosis present

## 2024-11-20 DIAGNOSIS — E782 Mixed hyperlipidemia: Secondary | ICD-10-CM | POA: Diagnosis present

## 2024-11-20 DIAGNOSIS — Z8616 Personal history of COVID-19: Secondary | ICD-10-CM | POA: Diagnosis not present

## 2024-11-20 DIAGNOSIS — I110 Hypertensive heart disease with heart failure: Secondary | ICD-10-CM | POA: Diagnosis present

## 2024-11-20 DIAGNOSIS — Z1152 Encounter for screening for COVID-19: Secondary | ICD-10-CM | POA: Diagnosis not present

## 2024-11-20 DIAGNOSIS — F419 Anxiety disorder, unspecified: Secondary | ICD-10-CM | POA: Diagnosis not present

## 2024-11-20 DIAGNOSIS — Z7951 Long term (current) use of inhaled steroids: Secondary | ICD-10-CM | POA: Diagnosis not present

## 2024-11-20 LAB — I-STAT VENOUS BLOOD GAS, ED
Acid-Base Excess: 11 mmol/L — ABNORMAL HIGH (ref 0.0–2.0)
Bicarbonate: 38.7 mmol/L — ABNORMAL HIGH (ref 20.0–28.0)
Calcium, Ion: 1.13 mmol/L — ABNORMAL LOW (ref 1.15–1.40)
HCT: 39 % (ref 39.0–52.0)
Hemoglobin: 13.3 g/dL (ref 13.0–17.0)
O2 Saturation: 51 %
Potassium: 4.1 mmol/L (ref 3.5–5.1)
Sodium: 138 mmol/L (ref 135–145)
TCO2: 41 mmol/L — ABNORMAL HIGH (ref 22–32)
pCO2, Ven: 65.8 mmHg — ABNORMAL HIGH (ref 44–60)
pH, Ven: 7.377 (ref 7.25–7.43)
pO2, Ven: 29 mmHg — CL (ref 32–45)

## 2024-11-20 LAB — RESPIRATORY PANEL BY PCR

## 2024-11-20 LAB — CBC WITH DIFFERENTIAL/PLATELET
Abs Immature Granulocytes: 0.08 K/uL — ABNORMAL HIGH (ref 0.00–0.07)
Basophils Absolute: 0 K/uL (ref 0.0–0.1)
Basophils Relative: 0 %
Eosinophils Absolute: 0 K/uL (ref 0.0–0.5)
Eosinophils Relative: 0 %
HCT: 40.1 % (ref 39.0–52.0)
Hemoglobin: 12 g/dL — ABNORMAL LOW (ref 13.0–17.0)
Immature Granulocytes: 1 %
Lymphocytes Relative: 4 %
Lymphs Abs: 0.7 K/uL (ref 0.7–4.0)
MCH: 24.2 pg — ABNORMAL LOW (ref 26.0–34.0)
MCHC: 29.9 g/dL — ABNORMAL LOW (ref 30.0–36.0)
MCV: 81 fL (ref 80.0–100.0)
Monocytes Absolute: 0.3 K/uL (ref 0.1–1.0)
Monocytes Relative: 2 %
Neutro Abs: 14.2 K/uL — ABNORMAL HIGH (ref 1.7–7.7)
Neutrophils Relative %: 93 %
Platelets: 266 K/uL (ref 150–400)
RBC: 4.95 MIL/uL (ref 4.22–5.81)
RDW: 16.9 % — ABNORMAL HIGH (ref 11.5–15.5)
WBC: 15.4 K/uL — ABNORMAL HIGH (ref 4.0–10.5)
nRBC: 0 % (ref 0.0–0.2)

## 2024-11-20 LAB — BASIC METABOLIC PANEL WITH GFR
Anion gap: 13 (ref 5–15)
BUN: 18 mg/dL (ref 8–23)
CO2: 33 mmol/L — ABNORMAL HIGH (ref 22–32)
Calcium: 10 mg/dL (ref 8.9–10.3)
Chloride: 96 mmol/L — ABNORMAL LOW (ref 98–111)
Creatinine, Ser: 1 mg/dL (ref 0.61–1.24)
GFR, Estimated: >60 mL/min (ref >60)
Glucose, Bld: 270 mg/dL — ABNORMAL HIGH (ref 70–99)
Potassium: 4.2 mmol/L (ref 3.5–5.1)
Sodium: 142 mmol/L (ref 135–145)

## 2024-11-20 LAB — RESP PANEL BY RT-PCR (RSV, FLU A&B, COVID)  RVPGX2
Influenza A by PCR: NEGATIVE
Influenza B by PCR: NEGATIVE
Resp Syncytial Virus by PCR: NEGATIVE
SARS Coronavirus 2 by RT PCR: NEGATIVE

## 2024-11-20 LAB — TROPONIN T, HIGH SENSITIVITY
Troponin T High Sensitivity: 18 ng/L (ref 0–19)
Troponin T High Sensitivity: <15 ng/L (ref 0–19)

## 2024-11-20 LAB — PRO BRAIN NATRIURETIC PEPTIDE: Pro Brain Natriuretic Peptide: 242 pg/mL (ref <300.0)

## 2024-11-20 MED ORDER — ARFORMOTEROL TARTRATE 15 MCG/2ML IN NEBU
15.0000 ug | INHALATION_SOLUTION | Freq: Two times a day (BID) | RESPIRATORY_TRACT | Status: DC
  Administered 2024-11-20 – 2024-11-24 (×7): 15 ug via RESPIRATORY_TRACT
  Filled 2024-11-20 (×7): qty 2

## 2024-11-20 MED ORDER — AMLODIPINE BESYLATE 10 MG PO TABS
5.0000 mg | ORAL_TABLET | Freq: Every day | ORAL | Status: DC
  Administered 2024-11-21 – 2024-11-24 (×4): 5 mg via ORAL
  Filled 2024-11-20 (×4): qty 1

## 2024-11-20 MED ORDER — IPRATROPIUM BROMIDE 0.02 % IN SOLN
RESPIRATORY_TRACT | Status: AC
  Administered 2024-11-20: 0.5 mg via RESPIRATORY_TRACT
  Filled 2024-11-20: qty 5

## 2024-11-20 MED ORDER — VITAMIN C 500 MG PO TABS
1000.0000 mg | ORAL_TABLET | Freq: Every day | ORAL | Status: DC
  Administered 2024-11-21 – 2024-11-24 (×4): 1000 mg via ORAL
  Filled 2024-11-20 (×4): qty 2

## 2024-11-20 MED ORDER — PANTOPRAZOLE SODIUM 40 MG PO TBEC
40.0000 mg | DELAYED_RELEASE_TABLET | Freq: Two times a day (BID) | ORAL | Status: DC
  Administered 2024-11-20 – 2024-11-24 (×8): 40 mg via ORAL
  Filled 2024-11-20 (×8): qty 1

## 2024-11-20 MED ORDER — IPRATROPIUM BROMIDE 0.02 % IN SOLN
2.0000 mg | Freq: Once | RESPIRATORY_TRACT | Status: AC
  Administered 2024-11-20: 2 mg via RESPIRATORY_TRACT
  Filled 2024-11-20: qty 10

## 2024-11-20 MED ORDER — ONDANSETRON HCL 4 MG PO TABS: 4.0000 mg | ORAL_TABLET | Freq: Four times a day (QID) | ORAL | Status: DC | PRN

## 2024-11-20 MED ORDER — IPRATROPIUM-ALBUTEROL 0.5-2.5 (3) MG/3ML IN SOLN
3.0000 mL | Freq: Four times a day (QID) | RESPIRATORY_TRACT | Status: DC
  Administered 2024-11-20 – 2024-11-24 (×15): 3 mL via RESPIRATORY_TRACT
  Filled 2024-11-20 (×15): qty 3

## 2024-11-20 MED ORDER — SODIUM CHLORIDE 0.9 % IV SOLN
2.0000 g | Freq: Once | INTRAVENOUS | Status: AC
  Administered 2024-11-20: 2 g via INTRAVENOUS
  Filled 2024-11-20: qty 12.5

## 2024-11-20 MED ORDER — PREDNISONE 20 MG PO TABS
40.0000 mg | ORAL_TABLET | Freq: Every day | ORAL | Status: DC
  Administered 2024-11-22 – 2024-11-24 (×3): 40 mg via ORAL
  Filled 2024-11-20 (×3): qty 2

## 2024-11-20 MED ORDER — ALBUTEROL SULFATE (2.5 MG/3ML) 0.083% IN NEBU: 2.5000 mg | INHALATION_SOLUTION | RESPIRATORY_TRACT | Status: DC | PRN

## 2024-11-20 MED ORDER — BUDESONIDE 0.25 MG/2ML IN SUSP
0.2500 mg | Freq: Two times a day (BID) | RESPIRATORY_TRACT | Status: DC
  Administered 2024-11-21: 0.25 mg via RESPIRATORY_TRACT
  Filled 2024-11-20: qty 2

## 2024-11-20 MED ORDER — METHYLPREDNISOLONE SODIUM SUCC 125 MG IJ SOLR
125.0000 mg | Freq: Once | INTRAMUSCULAR | Status: AC
  Administered 2024-11-20: 125 mg via INTRAVENOUS
  Filled 2024-11-20: qty 2

## 2024-11-20 MED ORDER — ORAL CARE MOUTH RINSE: 15.0000 mL | OROMUCOSAL | Status: DC | PRN

## 2024-11-20 MED ORDER — LEVOTHYROXINE SODIUM 25 MCG PO TABS
25.0000 ug | ORAL_TABLET | Freq: Every day | ORAL | Status: DC
  Administered 2024-11-21 – 2024-11-24 (×4): 25 ug via ORAL
  Filled 2024-11-20 (×4): qty 1

## 2024-11-20 MED ORDER — ADULT MULTIVITAMIN W/MINERALS CH
1.0000 | ORAL_TABLET | Freq: Every day | ORAL | Status: DC
  Administered 2024-11-21 – 2024-11-24 (×4): 1 via ORAL
  Filled 2024-11-20 (×4): qty 1

## 2024-11-20 MED ORDER — IPRATROPIUM-ALBUTEROL 0.5-2.5 (3) MG/3ML IN SOLN
3.0000 mL | Freq: Four times a day (QID) | RESPIRATORY_TRACT | Status: DC
  Administered 2024-11-20: 3 mL via RESPIRATORY_TRACT
  Filled 2024-11-20: qty 3

## 2024-11-20 MED ORDER — ONDANSETRON HCL 4 MG/2ML IJ SOLN: 4.0000 mg | Freq: Four times a day (QID) | INTRAMUSCULAR | Status: DC | PRN

## 2024-11-20 MED ORDER — SODIUM CHLORIDE 0.9% FLUSH
3.0000 mL | Freq: Two times a day (BID) | INTRAVENOUS | Status: DC
  Administered 2024-11-20 – 2024-11-24 (×8): 3 mL via INTRAVENOUS

## 2024-11-20 MED ORDER — DOXYCYCLINE HYCLATE 100 MG PO TABS
100.0000 mg | ORAL_TABLET | Freq: Two times a day (BID) | ORAL | Status: DC
  Administered 2024-11-20 – 2024-11-24 (×8): 100 mg via ORAL
  Filled 2024-11-20 (×8): qty 1

## 2024-11-20 MED ORDER — GUAIFENESIN ER 600 MG PO TB12
1200.0000 mg | ORAL_TABLET | Freq: Every day | ORAL | Status: DC
  Administered 2024-11-21 – 2024-11-24 (×4): 1200 mg via ORAL
  Filled 2024-11-20 (×4): qty 2

## 2024-11-20 MED ORDER — ALPRAZOLAM 0.5 MG PO TABS
0.5000 mg | ORAL_TABLET | Freq: Two times a day (BID) | ORAL | Status: DC | PRN
  Administered 2024-11-20 – 2024-11-22 (×3): 0.5 mg via ORAL
  Filled 2024-11-20 (×3): qty 1

## 2024-11-20 MED ORDER — ENSIFENTRINE 3 MG/2.5ML IN SUSP: 1.0000 | Freq: Two times a day (BID) | RESPIRATORY_TRACT | Status: DC

## 2024-11-20 MED ORDER — DIGOXIN 125 MCG PO TABS
125.0000 ug | ORAL_TABLET | Freq: Every day | ORAL | Status: DC
  Administered 2024-11-21 – 2024-11-24 (×4): 125 ug via ORAL
  Filled 2024-11-20 (×4): qty 1

## 2024-11-20 MED ORDER — MELATONIN 3 MG PO TABS
12.0000 mg | ORAL_TABLET | Freq: Every day | ORAL | Status: DC
  Administered 2024-11-20 – 2024-11-23 (×4): 12 mg via ORAL
  Filled 2024-11-20 (×4): qty 4

## 2024-11-20 MED ORDER — FUROSEMIDE 40 MG PO TABS
40.0000 mg | ORAL_TABLET | Freq: Every day | ORAL | Status: DC
  Administered 2024-11-21 – 2024-11-22 (×2): 40 mg via ORAL
  Filled 2024-11-20 (×2): qty 1

## 2024-11-20 MED ORDER — SERTRALINE HCL 100 MG PO TABS
100.0000 mg | ORAL_TABLET | Freq: Every day | ORAL | Status: DC
  Administered 2024-11-21 – 2024-11-24 (×4): 100 mg via ORAL
  Filled 2024-11-20 (×4): qty 1

## 2024-11-20 MED ORDER — ACETAMINOPHEN 325 MG PO TABS: 650.0000 mg | ORAL_TABLET | Freq: Four times a day (QID) | ORAL | Status: DC | PRN

## 2024-11-20 MED ORDER — REVEFENACIN 175 MCG/3ML IN SOLN
175.0000 ug | Freq: Every day | RESPIRATORY_TRACT | Status: DC
  Administered 2024-11-21 – 2024-11-24 (×4): 175 ug via RESPIRATORY_TRACT
  Filled 2024-11-20 (×4): qty 3

## 2024-11-20 MED ORDER — ATORVASTATIN CALCIUM 40 MG PO TABS
40.0000 mg | ORAL_TABLET | Freq: Every day | ORAL | Status: DC
  Administered 2024-11-20 – 2024-11-23 (×4): 40 mg via ORAL
  Filled 2024-11-20 (×4): qty 1

## 2024-11-20 MED ORDER — ALBUTEROL SULFATE (2.5 MG/3ML) 0.083% IN NEBU
10.0000 mg | INHALATION_SOLUTION | Freq: Once | RESPIRATORY_TRACT | Status: AC
  Administered 2024-11-20: 10 mg via RESPIRATORY_TRACT
  Filled 2024-11-20: qty 12

## 2024-11-20 MED ORDER — MAGNESIUM SULFATE 2 GM/50ML IV SOLN
2.0000 g | Freq: Once | INTRAVENOUS | Status: AC
  Administered 2024-11-20: 2 g via INTRAVENOUS
  Filled 2024-11-20: qty 50

## 2024-11-20 MED ORDER — SODIUM CHLORIDE 0.9 % IV BOLUS
500.0000 mL | Freq: Once | INTRAVENOUS | Status: AC
Start: 2024-11-20 — End: 2024-11-20
  Administered 2024-11-20: 500 mL via INTRAVENOUS

## 2024-11-20 MED ORDER — ACETAMINOPHEN 650 MG RE SUPP: 650.0000 mg | Freq: Four times a day (QID) | RECTAL | Status: DC | PRN

## 2024-11-20 MED ORDER — ENOXAPARIN SODIUM 40 MG/0.4ML IJ SOSY
40.0000 mg | PREFILLED_SYRINGE | Freq: Every day | INTRAMUSCULAR | Status: DC
  Administered 2024-11-20 – 2024-11-23 (×4): 40 mg via SUBCUTANEOUS
  Filled 2024-11-20 (×4): qty 0.4

## 2024-11-20 MED ORDER — ASPIRIN 81 MG PO TBEC
81.0000 mg | DELAYED_RELEASE_TABLET | Freq: Every day | ORAL | Status: DC
  Administered 2024-11-21 – 2024-11-24 (×4): 81 mg via ORAL
  Filled 2024-11-20 (×4): qty 1

## 2024-11-20 MED ORDER — VITAMIN D3 25 MCG (1000 UNIT) PO TABS
2000.0000 [IU] | ORAL_TABLET | Freq: Every day | ORAL | Status: DC
  Administered 2024-11-20 – 2024-11-24 (×5): 2000 [IU] via ORAL
  Filled 2024-11-20 (×5): qty 2

## 2024-11-20 MED ORDER — METHYLPREDNISOLONE SODIUM SUCC 40 MG IJ SOLR
40.0000 mg | Freq: Two times a day (BID) | INTRAMUSCULAR | Status: AC
  Administered 2024-11-20 – 2024-11-21 (×2): 40 mg via INTRAVENOUS
  Filled 2024-11-20 (×2): qty 1

## 2024-11-20 MED ORDER — POLYETHYLENE GLYCOL 3350 17 G PO PACK: 17.0000 g | PACK | Freq: Every day | ORAL | Status: DC | PRN

## 2024-11-20 NOTE — ED Triage Notes (Signed)
 Pt with SHOB since 1000; RT in to assess; pt can only speak 1-2 words at a time; O2 increased to 4L on arrival to triage; 100% O2 sat

## 2024-11-20 NOTE — ED Notes (Signed)
 Called [facility] for transport.

## 2024-11-20 NOTE — ED Notes (Signed)
[facility] to transport.  

## 2024-11-20 NOTE — Progress Notes (Signed)
   11/20/24 2237  BiPAP/CPAP/SIPAP  $ Non-Invasive Home Ventilator  Initial  BiPAP/CPAP/SIPAP Pt Type Adult  BiPAP/CPAP/SIPAP Resmed  Mask Type Nasal mask  Mask Size Small  EPAP 9 cmH2O (home setting)  Flow Rate 2 lpm  Patient Home Machine No  Patient Home Mask No  Patient Home Tubing No  Auto Titrate No  Device Plugged into RED Power Outlet Yes  BiPAP/CPAP /SiPAP Vitals  Resp 18  MEWS Score/Color  MEWS Score 0  MEWS Score Color Green

## 2024-11-20 NOTE — H&P (Signed)
 History and Physical    Patient: Paul [name] MRN: [id number] DOA: 11/20/2024  Date of Service: the patient was seen and examined on 11/20/2024  Patient coming from: Home  Chief Complaint:  Chief Complaint  Patient presents with   Shortness of Breath    HPI:   78 y.o. male with medical history significant for nicotine dependence (quit in 2010), COPD (follows with [facility]), chronic respiratory failure on 2 L O2 via [place] nightly, OSA on CPAP, chronic HFpEF, hypertension, non-insulin -dependent type 2 diabetes, paroxysmal A-fib not currently on anticoagulation status post Watchman device, hypothyroidism, generalized anxiety disorder presenting to [facility] emergency department with complaints of shortness of breath.  Of note, patient has been admitted for recurrent COPD exacerbations in excess of 6 times in the past 2 months the last hospitalization being from 11/14 until 11/17 at [facility].  Patient explains that this morning he began to experience worsening shortness of breath beyond his baseline.  Patient's shortness of breath continued to worsen throughout the morning despite him increasing his home supplemental oxygen  from 2 L to 4 L.  Patient also complains of increasing productive cough with yellow to green-colored sputum.  Patient reports compliance with his medication regimen.  Patient denies sick contacts, recent travel or contact with known COVID-19 infection.  Patient symptoms continue to worsen until he presented to [facility] emergency department for evaluation.  Upon evaluation in the emergency department patient clinically was felt to be in a recurrent acute COPD exacerbation.  Patient was given a 1 hour course of nebulized bronchodilator therapy, cefepime  and magnesium .  The hospitalist group was then called and patient was accepted for transfer to [facility] for continued evaluation and medical treatment.  Review of  Systems: Review of Systems  Constitutional:  Positive for malaise/fatigue.  Respiratory:  Positive for shortness of breath.      Past Medical History:  Diagnosis Date   Anemia    Aortic atherosclerosis    Aspiration pneumonia due to inhalation of vomitus (HCC) 02/03/2022   Atrial fibrillation (HCC)    Back pain    COPD (chronic obstructive pulmonary disease) (HCC)    COPD exacerbation (HCC) 12/27/2021   COVID-19 12/24/2020   Diabetes mellitus without complication (HCC)    Dizziness 04/08/2022   Elevated PSA    GAD (generalized anxiety disorder)    GERD (gastroesophageal reflux disease)    High cholesterol    Hypertension    Lingular pneumonia 01/05/2021   See cxr 01/06/20 rx with zpak/ cefipime x 5 days and then developed purulent sputum again 01/12/21 so rx levcaquin 500 mg daily x 7 days then return for cxr before more    Lung nodule < 6cm on CT 05/29/2016   Osteoporosis    Oxygen  deficiency    Pneumonia    January 2022   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 Cindie with a 20 mm Watchman FLX device   RSV bronchitis 01/27/2022   Vertebral compression fracture (HCC) 05/29/2019   T8 compression fracture noted on CT scan 05/28/2019 inpatient with chronic steroid dependent COPD - rx calcitonin nasal spray rx per PCP     Past Surgical History:  Procedure Laterality Date   CATARACT EXTRACTION Right    ELBOW SURGERY  07/2022   surgery on olecranon bursa. Dr. Alyse   IR KYPHO EA ADDL LEVEL THORACIC OR LUMBAR  11/28/2021   LEFT ATRIAL APPENDAGE OCCLUSION N/A 08/02/2021  Procedure: LEFT ATRIAL APPENDAGE OCCLUSION;  Surgeon: Wonda Sharper, MD;  Location: [facility] INVASIVE CV LAB;  Service: Cardiovascular;  Laterality: N/A;   SKIN SURGERY     shoulders and chest   TEE WITHOUT CARDIOVERSION N/A 08/02/2021   Procedure: TRANSESOPHAGEAL ECHOCARDIOGRAM (TEE);  Surgeon: Wonda Sharper, MD;  Location: [facility] INVASIVE CV LAB;  Service:  Cardiovascular;  Laterality: N/A;   TEE WITHOUT CARDIOVERSION N/A 09/13/2021   Procedure: TRANSESOPHAGEAL ECHOCARDIOGRAM (TEE);  Surgeon: Francyne Headland, MD;  Location: [facility] ENDOSCOPY;  Service: Cardiovascular;  Laterality: N/A;    Social History:  reports that he quit smoking about 15 years ago. His smoking use included cigarettes. He started smoking about 67 years ago. He has a 104 pack-year smoking history. He has never used smokeless tobacco. He reports that he does not currently use alcohol. He reports that he does not use drugs.  Allergies  Allergen Reactions   Tape Other (See Comments)    PLEASE USE COBAN WRAP IN LIEU OF TAPE, as it pulls off the skin!!   Daliresp  [Roflumilast ] Diarrhea and Nausea And Vomiting   Levaquin  [Levofloxacin ] Palpitations and Other (See Comments)    Made the B/P fluctuate and heart raced   Alendronate Anxiety and Other (See Comments)    Jittery/nervous    Family History  Problem Relation Age of Onset   Heart disease Brother    Heart disease Mother    Heart disease Father    Cancer Paternal Uncle     Prior to Admission medications   Medication Sig Start Date End Date Taking? Authorizing Provider  Accu-Chek Softclix Lancets lancets Check fasting sugar once daily. Patient not taking: Reported on 11/15/2024 08/05/23   CoxAbigail, MD  albuterol  (PROVENTIL ) (2.5 MG/3ML) 0.083% nebulizer solution Take 3 mLs (2.5 mg total) by nebulization every 6 (six) hours as needed for shortness of breath or wheezing. Patient not taking: Reported on 11/15/2024 10/23/22   Parrett, Madelin RAMAN, NP  albuterol  (VENTOLIN  HFA) 108 (90 Base) MCG/ACT inhaler Inhale 1-2 puffs into the lungs every 6 (six) hours as needed for wheezing or shortness of breath. 10/15/24   Sirivol, Mamatha, MD  ALPRAZolam  (XANAX ) 0.5 MG tablet Take 1 tablet (0.5 mg total) by mouth 2 (two) times daily as needed for anxiety. F41.1 09/24/24   Nicholaus Credit, PA-C  amLODipine  (NORVASC ) 5 MG tablet Take 1 tablet  (5 mg total) by mouth daily. 09/12/24   Fausto Burnard LABOR, DO  arformoterol  (BROVANA ) 15 MCG/2ML NEBU Take 2 mLs (15 mcg total) by nebulization 2 (two) times daily. 10/28/24   Parrett, Madelin RAMAN, NP  Ascorbic Acid  (VITAMIN C ) 1000 MG tablet Take 1,000 mg by mouth daily.    [provider]  aspirin  EC 81 MG tablet Take 1 tablet (81 mg total) by mouth daily. Swallow whole. 01/28/22   McDaniel, Jill D, NP  atorvastatin  (LIPITOR) 40 MG tablet TAKE 1 TABLET BY MOUTH EVERYDAY AT BEDTIME 07/22/24   Cox, [name], MD  budesonide  (PULMICORT ) 0.25 MG/2ML nebulizer solution Take 2 mLs (0.25 mg total) by nebulization 2 (two) times daily. 10/28/24   Parrett, Madelin RAMAN, NP  CALCIUM  PO Take 1 tablet by mouth daily.    [provider]  Cholecalciferol  (VITAMIN D3) 50 MCG (2000 UT) TABS Take 2,000 Units by mouth in the morning.    [provider]  dapagliflozin  propanediol (FARXIGA ) 10 MG TABS tablet Take 1 tablet (10 mg total) by mouth daily. 07/06/24   [name], MD  denosumab  (PROLIA ) 60 MG/ML SOSY  injection Inject 60 mg into the skin every 6 (six) months. 03/11/23   Cox, [name], MD  digoxin  (LANOXIN ) 0.125 MG tablet TAKE 1 TABLET BY MOUTH DAILY 04/26/24   Jeffrie Oneil BROCKS, MD  Ensifentrine  (OHTUVAYRE ) 3 MG/2.5ML SUSP Take 1 ampule by nebulization 2 (two) times daily. 11/15/24   Parrett, Madelin RAMAN, NP  ferrous sulfate  325 (65 FE) MG tablet Take 325 mg by mouth 2 (two) times daily with a meal.    [provider]  furosemide  (LASIX ) 40 MG tablet Take 1 tablet (40 mg total) by mouth daily. 09/12/24   Fausto Sor A, DO  Glucose Blood (BLOOD GLUCOSE TEST STRIPS) STRP Use to test blood sugar up to twice daily. May substitute to any manufacturer covered by patient's insurance. E11.0 Patient not taking: Reported on 11/15/2024 07/14/23   CoxAbigail, MD  Guaifenesin  (MUCINEX  MAXIMUM STRENGTH) 1200 MG TB12 Take 1,200 mg by mouth daily.    [provider]  Lancets Misc. MISC Use to test  blood sugar up to twice daily. May substitute to any manufacturer covered by patient's insurance. E11.0 Patient not taking: Reported on 11/15/2024 07/14/23   CoxAbigail, MD  levothyroxine  (SYNTHROID ) 25 MCG tablet Take 1 tablet (25 mcg total) by mouth daily. 05/21/24   CoxAbigail, MD  Melatonin 12 MG TABS Take 12 mg by mouth at bedtime.    [provider]  Multiple Vitamin (MULTIVITAMIN WITH MINERALS) TABS tablet Take 1 tablet by mouth daily with breakfast.    [provider]  Omega-3 Fatty Acids  (FISH OIL) 1200 MG CAPS Take 1,200 mg by mouth at bedtime.    [provider]  OXYGEN  Inhale 2-3 L/min into the lungs continuous.    [provider]  pantoprazole  (PROTONIX ) 40 MG tablet Take 1 tablet (40 mg total) by mouth 2 (two) times daily. 05/20/24   Parrett, Madelin RAMAN, NP  Probiotic Product (PROBIOTIC DAILY) CAPS Take 1 capsule by mouth in the morning.    [provider]  revefenacin  (YUPELRI ) 175 MCG/3ML nebulizer solution Take 3 mLs (175 mcg total) by nebulization daily. 10/28/24   Parrett, Madelin RAMAN, NP  sertraline  (ZOLOFT ) 100 MG tablet Take 1 tablet (100 mg total) by mouth daily. 10/28/24   [name], MD    Physical Exam:  Vitals:   11/20/24 1502 11/20/24 1630 11/20/24 1740 11/20/24 1758  BP: 133/71 (!) 149/74  (!) 160/68  Pulse: 96 95 (!) 101 100  Resp: (!) 23 19 18    Temp:    98.3 F (36.8 C)  TempSrc:    Oral  SpO2: 99% 99% 100% 99%  Weight:      Height:        Constitutional: Awake alert and oriented x3, in mild respiratory distress Skin: no rashes, no lesions, poor skin turgor noted. Eyes: Pupils are equally reactive to light.  No evidence of scleral icterus or conjunctival pallor.  ENMT: Moist mucous membranes noted.  Posterior pharynx clear of any exudate or lesions.   Neck: Slightly tender left anterior cervical chain lymphadenopathy.  No thyromegaly.  No evidence of jugular venous distension.   Respiratory: Markedly diminished  breath sounds in all fields.  Increased respiratory rate without accessory muscle use.  Cardiovascular: Regular rate and rhythm, no murmurs / rubs / gallops. No extremity edema. 2+ pedal pulses. No carotid bruits.  Chest:   Nontender without crepitus or deformity.   Back:   Nontender without crepitus or deformity. Abdomen: Abdomen is soft and nontender.  No  evidence of intra-abdominal masses.  Positive bowel sounds noted in all quadrants.   Musculoskeletal: No joint deformity upper and lower extremities. Good ROM, no contractures. Normal muscle tone.  Neurologic: CN 2-12 grossly intact. Sensation intact.  Patient moving all 4 extremities spontaneously.  Patient is following all commands.  Patient is responsive to verbal stimuli.   Psychiatric: Patient exhibits normal mood with appropriate affect.  Patient seems to possess insight as to their current situation.    Data Reviewed:  I have personally reviewed and interpreted labs, imaging.  Significant findings are   Lab Results  Component Value Date   WBC 15.4 (H) 11/20/2024   HGB 13.3 11/20/2024   HCT 39.0 11/20/2024   MCV 81.0 11/20/2024   PLT 266 11/20/2024   Lab Results  Component Value Date   K 4.1 11/20/2024   Lab Results  Component Value Date   BUN 18 11/20/2024   Lab Results  Component Value Date   CREATININE 1.00 11/20/2024    CXR:   Chest X-ray was personally reviewed.  No evidence of focal infiltrates.  No evidence of pleural effusion.  No evidence of pneumothorax.    EKG: Personally reviewed.  Rhythm is normal sinus rhythm with heart rate of 99 bpm.  No dynamic ST segment changes appreciated.   Assessment & Plan Acute on chronic respiratory failure with hypoxia (HCC) COPD with acute exacerbation [facility]) Patient is presented for hospitalization 7 times in just over 2 months Current presentation is similar to prior presentations and seems to occur despite patient's continued compliance with an aggressive respiratory  regimen in the outpatient setting Patient was supposed to be on Ohtuvayre  at this point based on his last pulmonary note 11/24 but per my discussions with the wife he has still yet to receive this medication.  Patient is currently taking Arformeterol, Budesonide  and Yupelri  Considering patient's recurrent exacerbations and what seems to be refractory COPD, will obtain pulmonology consultation in the morning. Treating with aggressive bronchodilator therapy, Solu-Medrol  and doxycycline  at this point Titrating supplemental oxygen  to achieve oxygen  saturations of 89 to 92% Mixed hyperlipidemia Continue home regimen of Lipitor Chronic diastolic CHF (congestive heart failure) (HCC) BNP consistent with prior values Euvolemic on exam Paroxysmal atrial fibrillation (HCC) Not on anticoagulation due to bleeding complications in the past  status post Watchman device Continuing digoxin  Monitoring on telemetry Currently rate controlled 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HCC) Patient been placed on Accu-Cheks before every meal and nightly with sliding scale insulin  Holding home regimen of hypoglycemics Hemoglobin A1C ordered Diabetic Diet  Essential hypertension Continue home regimen of amlodipine  As needed intravenous angiotensins for markedly elevated blood pressure Hypothyroidism Continue outpatient regimen of Synthroid  GERD without esophagitis Continue Protonix  twice daily Generalized anxiety disorder Continue sertraline  and as needed Xanax  OSA on CPAP CPAP nightly    Code Status:  Full code  code status decision has been confirmed with: patient and spouse Family Communication: Plan of care discussed with the spouse via phone conversation  Consults: Plan to contact pulmonology in the morning for formal consultation for recurrent refractory COPD  Severity of Illness:  The appropriate patient status for this patient is INPATIENT. Inpatient status is judged to be reasonable and  necessary in order to provide the required intensity of service to ensure the patient's safety. The patient's presenting symptoms, physical exam findings, and initial radiographic and laboratory data in the context of their chronic comorbidities is felt to place them at high risk for further clinical deterioration.  Furthermore, it is not anticipated that the patient will be medically stable for discharge from the hospital within 2 midnights of admission.   * I certify that at the point of admission it is my clinical judgment that the patient will require inpatient hospital care spanning beyond 2 midnights from the point of admission due to high intensity of service, high risk for further deterioration and high frequency of surveillance required.*  Author:  Zachary JINNY Ba MD  11/20/2024 6:48 PM

## 2024-11-20 NOTE — ED Provider Notes (Signed)
 [facility] Provider Note  CSN: [id number] Arrival date & time: 11/20/24 1221  Chief Complaint(s) Shortness of Breath  HPI Paul [name] is a 78 y.o. male with past medical history as below, significant for chronic respiratory failure on 2 L nasal cannula at baseline, COPD, A-fib (watchman), hypertension, GAD, asthma who presents to the ED with complaint of difficulty breathing  Worsening difficulty breathing since around 10 AM this morning.  He has increased his home oxygen  from 2 L to 4 L with minimal improvement.  Cough with yellow or green-colored sputum.  No fevers or chills.  No vomiting or nausea.  No chest pain but is having some chest tightness.  He has been compliant with his regular medications, he is not smoking.  He has been compliant with his nighttime CPAP.  Follows with [facility]; last seen in the office on 11/24  Past Medical History Past Medical History:  Diagnosis Date   Anemia    Aortic atherosclerosis    Aspiration pneumonia due to inhalation of vomitus (HCC) 02/03/2022   Atrial fibrillation (HCC)    Back pain    COPD (chronic obstructive pulmonary disease) (HCC)    COPD exacerbation (HCC) 12/27/2021   COVID-19 12/24/2020   Diabetes mellitus without complication (HCC)    Dizziness 04/08/2022   Elevated PSA    GAD (generalized anxiety disorder)    GERD (gastroesophageal reflux disease)    High cholesterol    Hypertension    Lingular pneumonia 01/05/2021   See cxr 01/06/20 rx with zpak/ cefipime x 5 days and then developed purulent sputum again 01/12/21 so rx levcaquin 500 mg daily x 7 days then return for cxr before more    Lung nodule < 6cm on CT 05/29/2016   Osteoporosis    Oxygen  deficiency    Pneumonia    January 2022   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 Cindie with a 20 mm Watchman FLX device   RSV bronchitis 01/27/2022   Vertebral compression  fracture (HCC) 05/29/2019   T8 compression fracture noted on CT scan 05/28/2019 inpatient with chronic steroid dependent COPD - rx calcitonin nasal spray rx per PCP    Patient Active Problem List   Diagnosis Date Noted   Severe persistent asthma with acute exacerbation (HCC) 10/14/2024   COPD exacerbation (HCC) 09/27/2024   Hypothyroidism 09/27/2024   Immunization due 09/16/2024   COPD (chronic obstructive pulmonary disease) (HCC) 09/02/2024   COPD with acute exacerbation (HCC) 08/30/2024   Presence of heart assist device (HCC) 07/08/2024   Abnormal TSH 04/05/2024   Encounter for Medicare annual wellness exam 04/04/2024   Syncope and collapse 08/29/2023   Laceration of left thigh 08/29/2023   Age-related osteoporosis without current pathological fracture 07/26/2023   Secondary hypercoagulable state 04/02/2023   OSA (obstructive sleep apnea) 03/26/2023   Hypoxia 02/04/2023   Elevated d-dimer 01/25/2023   Iron 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 08/16/2022   Olecranon bursitis of right elbow 05/16/2022   Syncope 04/08/2022   GERD (gastroesophageal reflux disease) / GI protection on antiplatelet therapy 12/27/2021   Lumbar back pain 11/17/2021   Oxygen  dependent 10/09/2021   Paroxysmal atrial fibrillation (HCC) 08/02/2021   Chronic respiratory failure with hypoxia (HCC) 01/16/2021   GAD (generalized anxiety disorder) 01/14/2021   Chronic diastolic CHF (congestive heart failure) (HCC) 04/01/2020   Age-related osteoporosis with healing pathological fracture, chronic back pain 02/06/2020  Essential hypertension 01/28/2020   Chronic bilateral thoracic back pain 01/25/2020   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HCC) 01/25/2020   Mixed hyperlipidemia 01/05/2020   Absolute anemia 05/29/2019   Pulmonary nodules 05/29/2019   Coronary artery calcification 04/06/2019   Atherosclerosis of aorta 04/06/2019   Acute on chronic respiratory failure with hypoxia  and hypercapnia (HCC)    Former cigarette smoker 12/05/2015   COPD GOLD  III     Home Medication(s) Prior to Admission medications   Medication Sig Start Date End Date Taking? Authorizing Provider  Accu-Chek Softclix Lancets lancets Check fasting sugar once daily. Patient not taking: Reported on 11/15/2024 08/05/23   CoxAbigail, MD  albuterol  (PROVENTIL ) (2.5 MG/3ML) 0.083% nebulizer solution Take 3 mLs (2.5 mg total) by nebulization every 6 (six) hours as needed for shortness of breath or wheezing. Patient not taking: Reported on 11/15/2024 10/23/22   Parrett, Madelin RAMAN, NP  albuterol  (VENTOLIN  HFA) 108 (90 Base) MCG/ACT inhaler Inhale 1-2 puffs into the lungs every 6 (six) hours as needed for wheezing or shortness of breath. 10/15/24   Sirivol, Mamatha, MD  ALPRAZolam  (XANAX ) 0.5 MG tablet Take 1 tablet (0.5 mg total) by mouth 2 (two) times daily as needed for anxiety. F41.1 09/24/24   Nicholaus Credit, PA-C  amLODipine  (NORVASC ) 5 MG tablet Take 1 tablet (5 mg total) by mouth daily. 09/12/24   Fausto Burnard LABOR, DO  arformoterol  (BROVANA ) 15 MCG/2ML NEBU Take 2 mLs (15 mcg total) by nebulization 2 (two) times daily. 10/28/24   Parrett, Madelin RAMAN, NP  Ascorbic Acid  (VITAMIN C ) 1000 MG tablet Take 1,000 mg by mouth daily.    [provider]  aspirin  EC 81 MG tablet Take 1 tablet (81 mg total) by mouth daily. Swallow whole. 01/28/22   McDaniel, Jill D, NP  atorvastatin  (LIPITOR) 40 MG tablet TAKE 1 TABLET BY MOUTH EVERYDAY AT BEDTIME 07/22/24   Cox, [name], MD  budesonide  (PULMICORT ) 0.25 MG/2ML nebulizer solution Take 2 mLs (0.25 mg total) by nebulization 2 (two) times daily. 10/28/24   Parrett, Madelin RAMAN, NP  CALCIUM  PO Take 1 tablet by mouth daily.    [provider]  Cholecalciferol  (VITAMIN D3) 50 MCG (2000 UT) TABS Take 2,000 Units by mouth in the morning.    [provider]  dapagliflozin  propanediol (FARXIGA ) 10 MG TABS tablet Take 1 tablet (10 mg total) by mouth daily. 07/06/24    CoxAbigail, MD  denosumab  (PROLIA ) 60 MG/ML SOSY injection Inject 60 mg into the skin every 6 (six) months. 03/11/23   Cox, [name], MD  digoxin  (LANOXIN ) 0.125 MG tablet TAKE 1 TABLET BY MOUTH DAILY 04/26/24   Jeffrie Oneil BROCKS, MD  Ensifentrine  (OHTUVAYRE ) 3 MG/2.5ML SUSP Take 1 ampule by nebulization 2 (two) times daily. 11/15/24   Parrett, Madelin RAMAN, NP  ferrous sulfate  325 (65 FE) MG tablet Take 325 mg by mouth 2 (two) times daily with a meal.    [provider]  furosemide  (LASIX ) 40 MG tablet Take 1 tablet (40 mg total) by mouth daily. 09/12/24   Fausto Burnard A, DO  Glucose Blood (BLOOD GLUCOSE TEST STRIPS) STRP Use to test blood sugar up to twice daily. May substitute to any manufacturer covered by patient's insurance. E11.0 Patient not taking: Reported on 11/15/2024 07/14/23   CoxAbigail, MD  Guaifenesin  (MUCINEX  MAXIMUM STRENGTH) 1200 MG TB12 Take 1,200 mg by mouth daily.    [provider]  Lancets Misc. MISC Use to test blood sugar  up to twice daily. May substitute to any manufacturer covered by patient's insurance. E11.0 Patient not taking: Reported on 11/15/2024 07/14/23   CoxAbigail, MD  levothyroxine  (SYNTHROID ) 25 MCG tablet Take 1 tablet (25 mcg total) by mouth daily. 05/21/24   CoxAbigail, MD  Melatonin 12 MG TABS Take 12 mg by mouth at bedtime.    [provider]  Multiple Vitamin (MULTIVITAMIN WITH MINERALS) TABS tablet Take 1 tablet by mouth daily with breakfast.    [provider]  Omega-3 Fatty Acids  (FISH OIL) 1200 MG CAPS Take 1,200 mg by mouth at bedtime.    [provider]  OXYGEN  Inhale 2-3 L/min into the lungs continuous.    [provider]  pantoprazole  (PROTONIX ) 40 MG tablet Take 1 tablet (40 mg total) by mouth 2 (two) times daily. 05/20/24   Parrett, Madelin RAMAN, NP  Probiotic Product (PROBIOTIC DAILY) CAPS Take 1 capsule by mouth in the morning.    [provider]  revefenacin  (YUPELRI ) 175 MCG/3ML  nebulizer solution Take 3 mLs (175 mcg total) by nebulization daily. 10/28/24   Parrett, Madelin RAMAN, NP  sertraline  (ZOLOFT ) 100 MG tablet Take 1 tablet (100 mg total) by mouth daily. 10/28/24   [name], MD                                                                                                                                    Past Surgical History Past Surgical History:  Procedure Laterality Date   CATARACT EXTRACTION Right    ELBOW SURGERY  07/2022   surgery on olecranon bursa. Dr. Alyse   IR KYPHO EA ADDL LEVEL THORACIC OR LUMBAR  11/28/2021   LEFT ATRIAL APPENDAGE OCCLUSION N/A 08/02/2021   Procedure: LEFT ATRIAL APPENDAGE OCCLUSION;  Surgeon: Wonda Sharper, MD;  Location[facility] INVASIVE CV LAB;  Service: Cardiovascular;  Laterality: N/A;   SKIN SURGERY     shoulders and chest   TEE WITHOUT CARDIOVERSION N/A 08/02/2021   Procedure: TRANSESOPHAGEAL ECHOCARDIOGRAM (TEE);  Surgeon: Wonda Sharper, MD;  Location: [facility] INVASIVE CV LAB;  Service: Cardiovascular;  Laterality: N/A;   TEE WITHOUT CARDIOVERSION N/A 09/13/2021   Procedure: TRANSESOPHAGEAL ECHOCARDIOGRAM (TEE);  Surgeon: Francyne Headland, MD;  Location: [facility] ENDOSCOPY;  Service: Cardiovascular;  Laterality: N/A;   Family History Family History  Problem Relation Age of Onset   Heart disease Brother    Heart disease Mother    Heart disease Father    Cancer Paternal Uncle     Social History Social History   Tobacco Use   Smoking status: Former    Current packs/day: 0.00    Average packs/day: 2.0 packs/day for 52.0 years (104.0 ttl pk-yrs)    Types: Cigarettes    Start date: 02/03/1957    Quit date: 02/03/2009    Years since quitting: 15.8   Smokeless tobacco: Never   Tobacco comments:    Counseled to remain smoke free  Vaping Use   Vaping status: Never Used  Substance Use Topics   Alcohol use: Not Currently    Alcohol/week: 0.0 standard drinks of alcohol    Comment: occ   Drug use: No   Allergies Tape,  Daliresp  [roflumilast ], Levaquin  [levofloxacin ], and Alendronate  Review of Systems A thorough review of systems was obtained and all systems are negative except as noted in the HPI and PMH.   Physical Exam Vital Signs  I have reviewed the triage vital signs BP 133/71   Pulse 96   Temp 97.6 F (36.4 C)   Resp (!) 23   Ht 5' 5 (1.651 m)   Wt 70.6 kg   SpO2 99%   BMI 25.89 kg/m  Physical Exam Vitals and nursing note reviewed.  Constitutional:      General: He is not in acute distress.    Appearance: He is well-developed.  HENT:     Head: Normocephalic and atraumatic.     Right Ear: External ear normal.     Left Ear: External ear normal.     Mouth/Throat:     Mouth: Mucous membranes are moist.  Eyes:     General: No scleral icterus. Cardiovascular:     Rate and Rhythm: Normal rate and regular rhythm.     Pulses: Normal pulses.     Heart sounds: Normal heart sounds.  Pulmonary:     Effort: Pulmonary effort is normal. Tachypnea present. No respiratory distress.     Breath sounds: Decreased air movement present. Decreased breath sounds and wheezing present.     Comments: Extremely diminished air movement Conversational dyspnea Abdominal:     General: Abdomen is flat.     Palpations: Abdomen is soft.     Tenderness: There is no abdominal tenderness.  Musculoskeletal:     Cervical back: No rigidity.     Right lower leg: No edema.     Left lower leg: No edema.  Skin:    General: Skin is warm and dry.     Capillary Refill: Capillary refill takes less than 2 seconds.  Neurological:     Mental Status: He is alert.  Psychiatric:        Mood and Affect: Mood normal.        Behavior: Behavior normal.     ED Results and Treatments Labs (all labs ordered are listed, but only abnormal results are displayed) Labs Reviewed  BASIC METABOLIC PANEL WITH GFR - Abnormal; Notable for the following components:      Result Value   Chloride 96 (*)    CO2 33 (*)    Glucose, Bld  270 (*)    All other components within normal limits  CBC WITH DIFFERENTIAL/PLATELET - Abnormal; Notable for the following components:   WBC 15.4 (*)    Hemoglobin 12.0 (*)    MCH 24.2 (*)    MCHC 29.9 (*)    RDW 16.9 (*)    Neutro Abs 14.2 (*)    Abs Immature Granulocytes 0.08 (*)    All other components within normal limits  I-STAT VENOUS BLOOD GAS, ED - Abnormal; Notable for the following components:   pCO2, Ven 65.8 (*)    pO2, Ven 29 (*)    Bicarbonate 38.7 (*)    TCO2 41 (*)    Acid-Base Excess 11.0 (*)    Calcium , Ion 1.13 (*)    All other components within normal limits  RESP PANEL BY RT-PCR (RSV, FLU A&B, COVID)  RVPGX2  EXPECTORATED  SPUTUM ASSESSMENT W GRAM STAIN, RFLX TO RESP C  PRO BRAIN NATRIURETIC PEPTIDE  TROPONIN T, HIGH SENSITIVITY  TROPONIN T, HIGH SENSITIVITY                                                                                                                          Radiology DG Chest Port 1 View Result Date: 11/20/2024 EXAM: 1 VIEW(S) XRAY OF THE CHEST 11/20/2024 12:48:00 PM COMPARISON: 11/05/2024 CLINICAL HISTORY: dib dib FINDINGS: LUNGS AND PLEURA: No focal pulmonary opacity. No pleural effusion. No pneumothorax. HEART AND MEDIASTINUM: No acute abnormality of the cardiac and mediastinal silhouettes. BONES AND SOFT TISSUES: No acute osseous abnormality. IMPRESSION: 1. No acute cardiopulmonary process. Electronically signed by: Lynwood Seip MD 11/20/2024 01:10 PM EST RP Workstation: [id number]    Pertinent labs & imaging results that were available during my care of the patient were reviewed by me and considered in my medical decision making (see MDM for details).  Medications Ordered in ED Medications  albuterol  (PROVENTIL ) (2.5 MG/3ML) 0.083% nebulizer solution 10 mg (10 mg Nebulization Given 11/20/24 1252)  ipratropium (ATROVENT ) nebulizer solution 2 mg (0.5 mg Nebulization Given 11/20/24 1311)  methylPREDNISolone  sodium succinate  (SOLU-MEDROL )  125 mg/2 mL injection 125 mg (125 mg Intravenous Given 11/20/24 1259)  magnesium  sulfate IVPB 2 g 50 mL (0 g Intravenous Stopped 11/20/24 1326)  sodium chloride  0.9 % bolus 500 mL (0 mLs Intravenous Stopped 11/20/24 1413)  ceFEPIme  (MAXIPIME ) 2 g in sodium chloride  0.9 % 100 mL IVPB (0 g Intravenous Stopped 11/20/24 1528)                                                                                                                                     Procedures .Critical Care  Performed by: Elnor Jayson LABOR, DO Authorized by: Elnor Jayson LABOR, DO   Critical care provider statement:    Critical care time (minutes):  45   Critical care time was exclusive of:  Separately billable procedures and treating other patients   Critical care was necessary to treat or prevent imminent or life-threatening deterioration of the following conditions:  Respiratory failure   Critical care was time spent personally by me on the following activities:  Development of treatment plan with patient or surrogate, discussions with consultants, evaluation of patient's response to treatment, examination of patient, ordering and review of laboratory studies, ordering and review of radiographic  studies, ordering and performing treatments and interventions, pulse oximetry, re-evaluation of patient's condition, review of old charts and obtaining history from patient or surrogate   Care discussed with: admitting provider     (including critical care time)  Medical Decision Making / ED Course    Medical Decision Making:    ZACHARIA SOWLES is a 78 y.o. male with past medical history as below, significant for chronic respiratory failure on 2 L nasal cannula at baseline, COPD, A-fib (watchman), hypertension, GAD, asthma who presents to the ED with complaint of difficulty breathing. The complaint involves an extensive differential diagnosis and also carries with it a high risk of complications and morbidity.  Serious etiology was  considered. Ddx includes but is not limited to: In my evaluation of this patient's dyspnea my DDx includes, but is not limited to, pneumonia, pulmonary embolism, pneumothorax, pulmonary edema, metabolic acidosis, asthma, COPD, cardiac cause, anemia, anxiety, etc.    Complete initial physical exam performed, notably the patient was in acute resp distress.    Reviewed and confirmed nursing documentation for past medical history, family history, social history.  Vital signs reviewed.    Acute on chronic respiratory failure COPD excerbation > - Patient with respiratory distress on arrival, tachypnea, conversational dyspnea. - Improving on supplemental oxygen , not hypoxic - Give 1 hour CAT w/ Atrovent , Solu-Medrol , mag sulfate, fluids. - Low threshold to start BiPAP - labs w/ WBC elev, o/w stable, gas stable - CXR stable  Clinical Course as of 11/20/24 1533  Sat Nov 20, 2024  1434 Mild improvement following 1 hr CAT, ipratropium, solumedrol, magnesium  sulfate. Still having conversational dyspnea, unable to take full inspiration. Mildly improved air movement but still quite diminished. Feel he would benefit from BIPAP, ordered. Plan for admission; pt/family agreeable to care plan  [SG]    Clinical Course User Index [SG] Elnor Jayson LABOR, DO     Admit [facility]                 Additional history obtained: -Additional history obtained from family -External records from outside source obtained and reviewed including: Chart review including previous notes, labs, imaging, consultation notes including  Prior admission Home medications   Lab Tests: -I ordered, reviewed, and interpreted labs.   The pertinent results include:   Labs Reviewed  BASIC METABOLIC PANEL WITH GFR - Abnormal; Notable for the following components:      Result Value   Chloride 96 (*)    CO2 33 (*)    Glucose, Bld 270 (*)    All other components within normal limits  CBC WITH DIFFERENTIAL/PLATELET -  Abnormal; Notable for the following components:   WBC 15.4 (*)    Hemoglobin 12.0 (*)    MCH 24.2 (*)    MCHC 29.9 (*)    RDW 16.9 (*)    Neutro Abs 14.2 (*)    Abs Immature Granulocytes 0.08 (*)    All other components within normal limits  I-STAT VENOUS BLOOD GAS, ED - Abnormal; Notable for the following components:   pCO2, Ven 65.8 (*)    pO2, Ven 29 (*)    Bicarbonate 38.7 (*)    TCO2 41 (*)    Acid-Base Excess 11.0 (*)    Calcium , Ion 1.13 (*)    All other components within normal limits  RESP PANEL BY RT-PCR (RSV, FLU A&B, COVID)  RVPGX2  EXPECTORATED SPUTUM ASSESSMENT W GRAM STAIN, RFLX TO RESP C  PRO BRAIN NATRIURETIC PEPTIDE  TROPONIN T, HIGH  SENSITIVITY  TROPONIN T, HIGH SENSITIVITY    Notable for WBC+  EKG   EKG Interpretation Date/Time:  Saturday November 20 2024 12:35:38 EST Ventricular Rate:  99 PR Interval:  130 QRS Duration:  95 QT Interval:  329 QTC Calculation: 423 R Axis:   65  Text Interpretation: Sinus rhythm Minimal ST depression, inferior leads Confirmed by Elnor Savant ([id number]) on 11/20/2024 12:44:23 PM         Imaging Studies ordered: I ordered imaging studies including CXR I independently visualized the following imaging with scope of interpretation limited to determining acute life threatening conditions related to emergency care; findings noted above I agree with the radiologist interpretation If any imaging was obtained with contrast I closely monitored patient for any possible adverse reaction a/w contrast administration in the emergency department   Medicines ordered and prescription drug management: Meds ordered this encounter  Medications   albuterol  (PROVENTIL ) (2.5 MG/3ML) 0.083% nebulizer solution 10 mg   ipratropium (ATROVENT ) nebulizer solution 2 mg   methylPREDNISolone  sodium succinate  (SOLU-MEDROL ) 125 mg/2 mL injection 125 mg   magnesium  sulfate IVPB 2 g 50 mL   sodium chloride  0.9 % bolus 500 mL   ipratropium (ATROVENT )  0.02 % nebulizer solution    Cothren, Steve: cabinet override   ceFEPIme  (MAXIPIME ) 2 g in sodium chloride  0.9 % 100 mL IVPB    Antibiotic Indication::   Sepsis    -I have reviewed the patients home medicines and have made adjustments as needed   Consultations Obtained: I requested consultation with the [facility],  and discussed lab and imaging findings as well as pertinent plan   Cardiac Monitoring: The patient was maintained on a cardiac monitor.  I personally viewed and interpreted the cardiac monitored which showed an underlying rhythm of: NSR Continuous pulse oximetry interpreted by myself, 94% on 4LNC.    Social Determinants of Health:  Diagnosis or treatment significantly limited by social determinants of health: former smoker   Reevaluation: After the interventions noted above, I reevaluated the patient and found that they have improved  Co morbidities that complicate the patient evaluation  Past Medical History:  Diagnosis Date   Anemia    Aortic atherosclerosis    Aspiration pneumonia due to inhalation of vomitus (HCC) 02/03/2022   Atrial fibrillation (HCC)    Back pain    COPD (chronic obstructive pulmonary disease) (HCC)    COPD exacerbation (HCC) 12/27/2021   COVID-19 12/24/2020   Diabetes mellitus without complication (HCC)    Dizziness 04/08/2022   Elevated PSA    GAD (generalized anxiety disorder)    GERD (gastroesophageal reflux disease)    High cholesterol    Hypertension    Lingular pneumonia 01/05/2021   See cxr 01/06/20 rx with zpak/ cefipime x 5 days and then developed purulent sputum again 01/12/21 so rx levcaquin 500 mg daily x 7 days then return for cxr before more    Lung nodule < 6cm on CT 05/29/2016   Osteoporosis    Oxygen  deficiency    Pneumonia    January 2022   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 Cindie with a 20 mm Watchman FLX device   RSV bronchitis 01/27/2022   Vertebral compression  fracture (HCC) 05/29/2019   T8 compression fracture noted on CT scan 05/28/2019 inpatient with chronic steroid dependent COPD - rx calcitonin nasal spray rx per PCP       Dispostion: Disposition decision including need for hospitalization was  considered, and patient admitted to the hospital.    Final Clinical Impression(s) / ED Diagnoses Final diagnoses:  COPD exacerbation (HCC)  Hypoxia  Respiratory distress        Elnor Jayson LABOR, DO 11/20/24 1533

## 2024-11-20 NOTE — Progress Notes (Signed)
 ED Pharmacy Antibiotic Sign Off An antibiotic consult was received from an ED provider for cefepime  per pharmacy dosing for COPD/resp inf. A chart review was completed to assess appropriateness.   The following one time order(s) were placed:  Cefepime  2g IV x 1  Further antibiotic and/or antibiotic pharmacy consults should be ordered by the admitting provider if indicated.   Thank you for allowing pharmacy to be a part of this patient's care.   Dorn Poot, [facility]  Clinical Pharmacist 11/20/24 2:34 PM

## 2024-11-21 DIAGNOSIS — E1169 Type 2 diabetes mellitus with other specified complication: Secondary | ICD-10-CM | POA: Diagnosis not present

## 2024-11-21 DIAGNOSIS — G4733 Obstructive sleep apnea (adult) (pediatric): Secondary | ICD-10-CM | POA: Diagnosis not present

## 2024-11-21 DIAGNOSIS — I480 Paroxysmal atrial fibrillation: Secondary | ICD-10-CM

## 2024-11-21 DIAGNOSIS — I10 Essential (primary) hypertension: Secondary | ICD-10-CM | POA: Diagnosis not present

## 2024-11-21 DIAGNOSIS — I5032 Chronic diastolic (congestive) heart failure: Secondary | ICD-10-CM | POA: Diagnosis not present

## 2024-11-21 DIAGNOSIS — J441 Chronic obstructive pulmonary disease with (acute) exacerbation: Secondary | ICD-10-CM | POA: Diagnosis not present

## 2024-11-21 DIAGNOSIS — E782 Mixed hyperlipidemia: Secondary | ICD-10-CM | POA: Diagnosis not present

## 2024-11-21 DIAGNOSIS — F411 Generalized anxiety disorder: Secondary | ICD-10-CM | POA: Diagnosis not present

## 2024-11-21 DIAGNOSIS — J9611 Chronic respiratory failure with hypoxia: Secondary | ICD-10-CM | POA: Diagnosis not present

## 2024-11-21 LAB — COMPREHENSIVE METABOLIC PANEL WITH GFR
ALT: 52 U/L — ABNORMAL HIGH (ref 0–44)
AST: 24 U/L (ref 15–41)
Albumin: 3.7 g/dL (ref 3.5–5.0)
Alkaline Phosphatase: 72 U/L (ref 38–126)
Anion gap: 7 (ref 5–15)
BUN: 18 mg/dL (ref 8–23)
CO2: 36 mmol/L — ABNORMAL HIGH (ref 22–32)
Calcium: 9.7 mg/dL (ref 8.9–10.3)
Chloride: 101 mmol/L (ref 98–111)
Creatinine, Ser: 0.85 mg/dL (ref 0.61–1.24)
GFR, Estimated: >60 mL/min (ref >60)
Glucose, Bld: 189 mg/dL — ABNORMAL HIGH (ref 70–99)
Potassium: 5 mmol/L (ref 3.5–5.1)
Sodium: 144 mmol/L (ref 135–145)
Total Bilirubin: 0.2 mg/dL (ref 0.0–1.2)
Total Protein: 6.3 g/dL — ABNORMAL LOW (ref 6.5–8.1)

## 2024-11-21 LAB — EXPECTORATED SPUTUM ASSESSMENT W GRAM STAIN, RFLX TO RESP C

## 2024-11-21 LAB — CBC WITH DIFFERENTIAL/PLATELET
Abs Immature Granulocytes: 0.06 K/uL (ref 0.00–0.07)
Basophils Absolute: 0 K/uL (ref 0.0–0.1)
Basophils Relative: 0 %
Eosinophils Absolute: 0 K/uL (ref 0.0–0.5)
Eosinophils Relative: 0 %
HCT: 36.3 % — ABNORMAL LOW (ref 39.0–52.0)
Hemoglobin: 10.8 g/dL — ABNORMAL LOW (ref 13.0–17.0)
Immature Granulocytes: 1 %
Lymphocytes Relative: 4 %
Lymphs Abs: 0.4 K/uL — ABNORMAL LOW (ref 0.7–4.0)
MCH: 24.2 pg — ABNORMAL LOW (ref 26.0–34.0)
MCHC: 29.8 g/dL — ABNORMAL LOW (ref 30.0–36.0)
MCV: 81.4 fL (ref 80.0–100.0)
Monocytes Absolute: 0.2 K/uL (ref 0.1–1.0)
Monocytes Relative: 2 %
Neutro Abs: 9.5 K/uL — ABNORMAL HIGH (ref 1.7–7.7)
Neutrophils Relative %: 93 %
Platelets: 229 K/uL (ref 150–400)
RBC: 4.46 MIL/uL (ref 4.22–5.81)
RDW: 16.6 % — ABNORMAL HIGH (ref 11.5–15.5)
WBC: 10.1 K/uL (ref 4.0–10.5)
nRBC: 0 % (ref 0.0–0.2)

## 2024-11-21 LAB — GLUCOSE, CAPILLARY
Glucose-Capillary: 143 mg/dL — ABNORMAL HIGH (ref 70–99)
Glucose-Capillary: 150 mg/dL — ABNORMAL HIGH (ref 70–99)
Glucose-Capillary: 157 mg/dL — ABNORMAL HIGH (ref 70–99)
Glucose-Capillary: 118 mg/dL — ABNORMAL HIGH (ref 70–99)

## 2024-11-21 LAB — BLOOD GAS, ARTERIAL
Acid-Base Excess: 8 mmol/L — ABNORMAL HIGH (ref 0.0–2.0)
Bicarbonate: 33.9 mmol/L — ABNORMAL HIGH (ref 20.0–28.0)
Drawn by: 331471
O2 Saturation: 96.7 %
Patient temperature: 37
pCO2 arterial: 51 mmHg — ABNORMAL HIGH (ref 32–48)
pH, Arterial: 7.43 (ref 7.35–7.45)
pO2, Arterial: 80 mmHg — ABNORMAL LOW (ref 83–108)

## 2024-11-21 LAB — MAGNESIUM: Magnesium: 2.9 mg/dL — ABNORMAL HIGH (ref 1.7–2.4)

## 2024-11-21 MED ORDER — BUDESONIDE 0.25 MG/2ML IN SUSP
0.5000 mg | Freq: Two times a day (BID) | RESPIRATORY_TRACT | Status: DC
  Administered 2024-11-21 – 2024-11-24 (×6): 0.5 mg via RESPIRATORY_TRACT
  Filled 2024-11-21 (×8): qty 4

## 2024-11-21 MED ORDER — INSULIN ASPART 100 UNIT/ML IJ SOLN
0.0000 [IU] | Freq: Three times a day (TID) | INTRAMUSCULAR | Status: DC
  Administered 2024-11-21: 2 [IU] via SUBCUTANEOUS
  Administered 2024-11-21: 3 [IU] via SUBCUTANEOUS
  Administered 2024-11-21: 2 [IU] via SUBCUTANEOUS
  Administered 2024-11-22: 5 [IU] via SUBCUTANEOUS
  Administered 2024-11-22 – 2024-11-23 (×3): 3 [IU] via SUBCUTANEOUS
  Administered 2024-11-23: 2 [IU] via SUBCUTANEOUS
  Administered 2024-11-23: 3 [IU] via SUBCUTANEOUS
  Filled 2024-11-21: qty 2
  Filled 2024-11-21: qty 5
  Filled 2024-11-21 (×4): qty 3
  Filled 2024-11-21 (×2): qty 2
  Filled 2024-11-21: qty 3

## 2024-11-21 MED ORDER — DAPAGLIFLOZIN PROPANEDIOL 10 MG PO TABS
10.0000 mg | ORAL_TABLET | Freq: Every day | ORAL | Status: DC
  Administered 2024-11-21 – 2024-11-22 (×2): 10 mg via ORAL
  Filled 2024-11-21 (×3): qty 1

## 2024-11-21 NOTE — Consult Note (Signed)
 NAME:  Paul [name], MRN:  [id number], DOB:  24-Nov-1946, LOS: 1 ADMISSION DATE:  11/20/2024, CONSULTATION DATE:  11/21/2024  REFERRING MD:  Gonfa, TRH, CHIEF COMPLAINT: Shortness of breath  History of Present Illness:  This is the seventh hospitalization in the last 6 months for this 78 year old man with severe COPD, steroid-dependent, chronic respiratory failure on oxygen  PFTs 01/2024 showed FEV1 of 33%, ratio 49, diffusion capacity 41% He is maintained on triple therapy nebs with budesonide , Brovana  and Yupelri .  Previously was on Breztri .  He has been maintained on prednisone  for the last few months. Recent CT chest 09/2024 showed diffuse emphysema He has not tolerated Daliresp  in the past due to GI side effects.  Previous trial with Nucala  caused 2 hospitalizations.  Azithromycin  thrice weekly was considered but held due to potential interaction with digoxin .  He has not qualified for bilevel due to pCO2 less than 50  and the stable outpatient state.  For severe anxiety he is maintained on alprazolam  and sertraline  On his last office visit 11/24, esfentrine was initiated and he is awaiting delivery He was recently hospitalized 11/14 to 11/17.  He is now back with shortness of breath, cough productive of yellow/green sputum.  This started within hours.  He self treated with neb x 1, Xanax .  In the ED, he was treated with prolonged neb, cefepime  and magnesium  and after hospitalization switch to Solu-Medrol  doxycycline  and bronchodilator therapy  Pertinent  Medical History  HFpEF Atrial fibrillation not on anticoagulation due to severe hematuria and poor balance OSA on CPAP with oxygen  greater than  Significant Hospital Events: Including procedures, antibiotic start and stop dates in addition to other pertinent events     Interim History / Subjective:  Out of bed to chair, feels somewhat improved On 2 L nasal cannula  Objective    Blood pressure (!) 137/95, pulse 80, temperature 97.6  F (36.4 C), temperature source Oral, resp. rate 18, height 5' 5 (1.651 m), weight 70.6 kg, SpO2 97%.    FiO2 (%):  [30 %] 30 % PEEP:  [5 cmH20] 5 cmH20 Pressure Support:  [7 cmH20-10 cmH20] 7 cmH20   Intake/Output Summary (Last 24 hours) at 11/21/2024 1019 Last data filed at 11/21/2024 0855 Gross per 24 hour  Intake 1009.44 ml  Output 925 ml  Net 84.44 ml   Filed Weights   11/20/24 1227  Weight: 70.6 kg    Examination: General: Elderly man sitting in chair, no distress, able to speak in full sentences HENT: No pallor, no JVD or lymphadenopathy Lungs: Clear breath sounds anterior, decreased posterior, no rhonchi, no accessory muscle use Cardiovascular: S1-S2 regular, no murmur Abdomen: Soft, nontender Extremities: No edema, no deformity Neuro: Alert, interactive, no tremors, no asterixis   Labs show normal electrolytes, no leukocytosis, mild anemia Chest x-ray appears clear, no infiltrates  Resolved problem list   Assessment and Plan   Recurrent COPD exacerbation -not clearly infectious cause at this time , seems to have started within hours indicating bronchospasm and certainly anxiety contributing, recently prednisone   dose was decreased from 25 mg to 15 mg Chronic respiratory failure with hypoxia  -Agree with current treatment of exacerbation with triple neb therapy and IV Solu-Medrol  - Eventually he can be discharged on 40 mg of prednisone  with a plan to taper gradually by 5 mg every 3 days to baseline dose of 25 mg until he is seen again in the office - Awaiting Ensifentrine  approval and delivery - Certainly a huge anxiety component -  for future/sudden exacerbations he can trial up to 1 mg of Xanax  and also continuous nebulizer before coming into the ED to break the cycle of anxiety and dyspnea - Obtain ABG to see if he will qualify for nocturnal NIV - He is resistant to trying biologic again, azithromycin  is contraindicated due to drug interaction - Close follow-up  in the office with appointments every 2 weeks in the near term to prevent rehospitalization's  Osteoporosis with vertebral compression fractures -he understands the risk of long-term prednisone    I discussed above plan with him to try and prevent recurrent hospitalizations   Labs   CBC: Recent Labs  Lab 11/20/24 1234 11/20/24 1246 11/21/24 0351  WBC 15.4*  --  10.1  NEUTROABS 14.2*  --  9.5*  HGB 12.0* 13.3 10.8*  HCT 40.1 39.0 36.3*  MCV 81.0  --  81.4  PLT 266  --  229    Basic Metabolic Panel: Recent Labs  Lab 11/20/24 1234 11/20/24 1246 11/21/24 0351  NA 142 138 144  K 4.2 4.1 5.0  CL 96*  --  101  CO2 33*  --  36*  GLUCOSE 270*  --  189*  BUN 18  --  18  CREATININE 1.00  --  0.85  CALCIUM  10.0  --  9.7  MG  --   --  2.9*   GFR: Estimated Creatinine Clearance: 61.3 mL/min (by C-G formula based on SCr of 0.85 mg/dL). Recent Labs  Lab 11/20/24 1234 11/21/24 0351  WBC 15.4* 10.1    Liver Function Tests: Recent Labs  Lab 11/21/24 0351  AST 24  ALT 52*  ALKPHOS 72  BILITOT 0.2  PROT 6.3*  ALBUMIN  3.7   No results for input(s): LIPASE, AMYLASE in the last 168 hours. No results for input(s): AMMONIA in the last 168 hours.  ABG    Component Value Date/Time   PHART 7.49 (H) 03/08/2022 1030   PCO2ART 40 03/08/2022 1030   PO2ART 94 03/08/2022 1030   HCO3 38.7 (H) 11/20/2024 1246   TCO2 41 (H) 11/20/2024 1246   ACIDBASEDEF 0.7 07/16/2022 2310   O2SAT 51 11/20/2024 1246     Coagulation Profile: No results for input(s): INR, PROTIME in the last 168 hours.  Cardiac Enzymes: No results for input(s): CKTOTAL, CKMB, CKMBINDEX, TROPONINI in the last 168 hours.  HbA1C: HbA1c POC (<> result, manual entry)  Date/Time Value Ref Range Status  10/14/2024 10:27 AM 6.8 4.0 - 5.6 % Final   Hgb A1c MFr Bld  Date/Time Value Ref Range Status  07/05/2024 08:02 AM 6.3 (H) 4.8 - 5.6 % Final    Comment:             Prediabetes: 5.7 - 6.4           Diabetes: >6.4          Glycemic control for adults with diabetes: <7.0   03/29/2024 08:16 AM 6.8 (H) 4.8 - 5.6 % Final    Comment:             Prediabetes: 5.7 - 6.4          Diabetes: >6.4          Glycemic control for adults with diabetes: <7.0     CBG: Recent Labs  Lab 11/21/24 0800  GLUCAP 143*    Review of Systems:   Constitutional: negative for anorexia, fevers and sweats  Eyes: negative for irritation, redness and visual disturbance  Ears, nose, mouth, throat, and face:  negative for earaches, epistaxis, nasal congestion and sore throat  Cardiovascular: negative for chest pain, dyspnea, lower extremity edema, orthopnea, palpitations and syncope  Gastrointestinal: negative for abdominal pain, constipation, diarrhea, melena, nausea and vomiting  Genitourinary:negative for dysuria, frequency and hematuria  Hematologic/lymphatic: negative for bleeding, easy bruising and lymphadenopathy  Musculoskeletal:negative for arthralgias, muscle weakness and stiff joints  Neurological: negative for coordination problems, gait problems, headaches and weakness  Endocrine: negative for diabetic symptoms including polydipsia, polyuria and weight loss   Past Medical History:  He,  has a past medical history of Anemia, Aortic atherosclerosis, Aspiration pneumonia due to inhalation of vomitus (HCC) (02/03/2022), Atrial fibrillation (HCC), Back pain, COPD (chronic obstructive pulmonary disease) (HCC), COPD exacerbation (HCC) (12/27/2021), COVID-19 (12/24/2020), Diabetes mellitus without complication (HCC), Dizziness (04/08/2022), Elevated PSA, GAD (generalized anxiety disorder), GERD (gastroesophageal reflux disease), High cholesterol, Hypertension, Lingular pneumonia (01/05/2021), Lung nodule < 6cm on CT (05/29/2016), Osteoporosis, Oxygen  deficiency, Pneumonia, 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   Surgical History:   Past Surgical History:  Procedure Laterality Date   CATARACT EXTRACTION Right    ELBOW SURGERY  07/2022   surgery on olecranon bursa. Dr. Alyse   IR KYPHO EA ADDL LEVEL THORACIC OR LUMBAR  11/28/2021   LEFT ATRIAL APPENDAGE OCCLUSION N/A 08/02/2021   Procedure: LEFT ATRIAL APPENDAGE OCCLUSION;  Surgeon: Wonda Sharper, MD;  Location[facility] INVASIVE CV LAB;  Service: Cardiovascular;  Laterality: N/A;   SKIN SURGERY     shoulders and chest   TEE WITHOUT CARDIOVERSION N/A 08/02/2021   Procedure: TRANSESOPHAGEAL ECHOCARDIOGRAM (TEE);  Surgeon: Wonda Sharper, MD;  Location: [facility] INVASIVE CV LAB;  Service: Cardiovascular;  Laterality: N/A;   TEE WITHOUT CARDIOVERSION N/A 09/13/2021   Procedure: TRANSESOPHAGEAL ECHOCARDIOGRAM (TEE);  Surgeon: Francyne Headland, MD;  Location: [facility] ENDOSCOPY;  Service: Cardiovascular;  Laterality: N/A;     Social History:   reports that he quit smoking about 15 years ago. His smoking use included cigarettes. He started smoking about 67 years ago. He has a 104 pack-year smoking history. He has never used smokeless tobacco. He reports that he does not currently use alcohol. He reports that he does not use drugs.   Family History:  His family history includes Cancer in his paternal uncle; Heart disease in his brother, father, and mother.   Allergies Allergies  Allergen Reactions   Tape Other (See Comments)    PLEASE USE COBAN WRAP IN LIEU OF TAPE, as it pulls off the skin!!   Daliresp  [Roflumilast ] Diarrhea and Nausea And Vomiting   Levaquin  [Levofloxacin ] Palpitations and Other (See Comments)    Made the B/P fluctuate and heart raced   Alendronate Anxiety and Other (See Comments)    Jittery/nervous     Home Medications  Prior to Admission medications   Medication Sig Start Date End Date Taking? Authorizing Provider  Accu-Chek Softclix Lancets lancets Check fasting sugar once daily. Patient not taking:  Reported on 11/15/2024 08/05/23   CoxAbigail, MD  albuterol  (PROVENTIL ) (2.5 MG/3ML) 0.083% nebulizer solution Take 3 mLs (2.5 mg total) by nebulization every 6 (six) hours as needed for shortness of breath or wheezing. Patient not taking: Reported on 11/15/2024 10/23/22   Parrett, Madelin RAMAN, NP  albuterol  (VENTOLIN  HFA) 108 (90 Base) MCG/ACT inhaler Inhale 1-2 puffs into the lungs every 6 (six) hours as needed for wheezing or shortness of breath. 10/15/24   Sirivol, Mamatha, MD  ALPRAZolam  (XANAX ) 0.5 MG  tablet Take 1 tablet (0.5 mg total) by mouth 2 (two) times daily as needed for anxiety. F41.1 09/24/24   Nicholaus Credit, PA-C  amLODipine  (NORVASC ) 5 MG tablet Take 1 tablet (5 mg total) by mouth daily. 09/12/24   Fausto Burnard LABOR, DO  arformoterol  (BROVANA ) 15 MCG/2ML NEBU Take 2 mLs (15 mcg total) by nebulization 2 (two) times daily. 10/28/24   Parrett, Madelin RAMAN, NP  Ascorbic Acid  (VITAMIN C ) 1000 MG tablet Take 1,000 mg by mouth daily.    [provider]  aspirin  EC 81 MG tablet Take 1 tablet (81 mg total) by mouth daily. Swallow whole. 01/28/22   McDaniel, Jill D, NP  atorvastatin  (LIPITOR) 40 MG tablet TAKE 1 TABLET BY MOUTH EVERYDAY AT BEDTIME 07/22/24   Cox, Kirsten, MD  budesonide  (PULMICORT ) 0.25 MG/2ML nebulizer solution Take 2 mLs (0.25 mg total) by nebulization 2 (two) times daily. 10/28/24   Parrett, Madelin RAMAN, NP  CALCIUM  PO Take 1 tablet by mouth daily.    [provider]  Cholecalciferol  (VITAMIN D3) 50 MCG (2000 UT) TABS Take 2,000 Units by mouth in the morning.    [provider]  dapagliflozin  propanediol (FARXIGA ) 10 MG TABS tablet Take 1 tablet (10 mg total) by mouth daily. 07/06/24   CoxAbigail, MD  denosumab  (PROLIA ) 60 MG/ML SOSY injection Inject 60 mg into the skin every 6 (six) months. 03/11/23   Cox, [name], MD  digoxin  (LANOXIN ) 0.125 MG tablet TAKE 1 TABLET BY MOUTH DAILY 04/26/24   Jeffrie Oneil BROCKS, MD  Ensifentrine  (OHTUVAYRE ) 3 MG/2.5ML SUSP Take 1 ampule by  nebulization 2 (two) times daily. 11/15/24   Parrett, Madelin RAMAN, NP  ferrous sulfate  325 (65 FE) MG tablet Take 325 mg by mouth 2 (two) times daily with a meal.    [provider]  furosemide  (LASIX ) 40 MG tablet Take 1 tablet (40 mg total) by mouth daily. 09/12/24   Fausto Burnard A, DO  Glucose Blood (BLOOD GLUCOSE TEST STRIPS) STRP Use to test blood sugar up to twice daily. May substitute to any manufacturer covered by patient's insurance. E11.0 Patient not taking: Reported on 11/15/2024 07/14/23   CoxAbigail, MD  Guaifenesin  (MUCINEX  MAXIMUM STRENGTH) 1200 MG TB12 Take 1,200 mg by mouth daily.    [provider]  Lancets Misc. MISC Use to test blood sugar up to twice daily. May substitute to any manufacturer covered by patient's insurance. E11.0 Patient not taking: Reported on 11/15/2024 07/14/23   CoxAbigail, MD  levothyroxine  (SYNTHROID ) 25 MCG tablet Take 1 tablet (25 mcg total) by mouth daily. 05/21/24   CoxAbigail, MD  Melatonin 12 MG TABS Take 12 mg by mouth at bedtime.    [provider]  Multiple Vitamin (MULTIVITAMIN WITH MINERALS) TABS tablet Take 1 tablet by mouth daily with breakfast.    [provider]  Omega-3 Fatty Acids  (FISH OIL) 1200 MG CAPS Take 1,200 mg by mouth at bedtime.    [provider]  OXYGEN  Inhale 2-3 L/min into the lungs continuous.    [provider]  pantoprazole  (PROTONIX ) 40 MG tablet Take 1 tablet (40 mg total) by mouth 2 (two) times daily. 05/20/24   Parrett, Madelin RAMAN, NP  Probiotic Product (PROBIOTIC DAILY) CAPS Take 1 capsule by mouth in the morning.    [provider]  revefenacin  (YUPELRI ) 175 MCG/3ML nebulizer solution Take 3 mLs (175 mcg total) by nebulization daily. 10/28/24   Parrett, Madelin RAMAN, NP  sertraline  (ZOLOFT ) 100 MG  tablet Take 1 tablet (100 mg total) by mouth daily. 10/28/24   [name], MD       Harden Staff MD. FCCP. [facility] Pager : [phone]  If  no response to pager , please call [phone] until 7 pm After 7:00 pm call Elink  [phone]   11/21/2024

## 2024-11-21 NOTE — Assessment & Plan Note (Signed)
 Continue outpatient regimen of Synthroid 

## 2024-11-21 NOTE — Assessment & Plan Note (Signed)
 Continue sertraline  and as needed Xanax 

## 2024-11-21 NOTE — Plan of Care (Signed)

## 2024-11-21 NOTE — Progress Notes (Signed)
 PROGRESS NOTE  Paul [name] FMW:[id number] DOB: 1946/02/13   PCP: [name], MD  Patient is from: Home.  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  6 admission in about 2 months.  The next day, pulmonology consulted.   Subjective: Seen and examined earlier this morning.  No major events overnight or this morning.  Reports some improvement in his breathing.  Continues to endorse productive cough with yellowish from.  Denies hemoptysis.  Denies chest pain, GI or UTI symptoms.   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  CXR without acute finding. COPD with acute exacerbation (HCC)-6 admission in 2 months.  Compliant with meds. -Acute respiratory failure ruled out.  No documented desaturation.  Has chronic hypercapnia.  -Still with productive cough, SOB and some work of breathing as he takes.  Diminished aeration bilaterally. -Continue steroid, doxycycline , scheduled and as needed nebulizers -Follow sputum cultures. -Appreciate help by pulmonology. -Titrate oxygen  for goal saturation between 89% and 92%. -Incentive spirometry, OOB, ambulatory saturation, PT, OT  Chronic diastolic CHF: TTE on 11/6 with LVEF of 60 to 65% indeterminate DD and normal RVSF.  Appears euvolemic on exam.  CXR without acute finding. -Continue p.o. Lasix  40 mg daily -Strict intake and output, daily weight  Paroxysmal atrial fibrillation s/p Watchman device.  Seems to be in sinus rhythm. -Continue home digoxin  -Optimize electrolytes.  DM type 2 with  diabetic mixed hyperlipidemia: A1c 6.8%.  On Farxiga  at home. Recent Labs  Lab 11/21/24 0800 11/21/24 1157  GLUCAP 143* 150*  - Continue SSI-moderate - Continue Farxiga   Essential hypertension: Normotensive. -Continue amlodipine   Hypothyroidism - Continue home Synthroid .  GERD without esophagitis -Continue Protonix  twice daily  Generalized anxiety disorder -Continue sertraline  and as needed Xanax   OSA on CPAP -CPAP nightly  Body mass index is 25.89 kg/m.           DVT prophylaxis:  enoxaparin  (LOVENOX ) injection 40 mg Start: 11/20/24 2200  Code Status: Full code Family Communication: None at bedside Level of care: Progressive Status is: Observation The patient will require care spanning > 2 midnights and should be moved to inpatient because: COPD exacerbation   Final disposition: Home once medically stable.   55 minutes with more than 50% spent in reviewing records, counseling patient/family and coordinating care.  Consultants:  Pulmonology  Procedures: None  Microbiology summarized: COVID-19, influenza and RSV PCR nonreactive 20 pathogen RVP nonreactive Sputum culture pending  Objective: Vitals:   11/21/24 0219 11/21/24 0427 11/21/24 0847 11/21/24 0849  BP:  (!) 137/95    Pulse:  80    Resp:  18    Temp:  97.6 F (36.4 C)    TempSrc:  Oral    SpO2: 97% 97% 97% 97%  Weight:      Height:        Examination:  GENERAL: No apparent distress.  Nontoxic. HEENT: MMM.  Vision and hearing grossly intact.  NECK: Supple.  No apparent JVD.  RESP: Some work of breathing as he speaks.  Diminished aeration bilaterally. CVS:  RRR. Heart sounds normal.  ABD/GI/GU: BS+. Abd soft, NTND.  MSK/EXT:  Moves extremities. No apparent deformity. No edema.  SKIN: no apparent skin lesion or wound NEURO: AA.  Oriented appropriately.  No apparent focal neuro deficit. PSYCH: Calm. Normal affect.   Sch Meds:  Scheduled Meds:  amLODipine   5 mg Oral Daily    arformoterol   15 mcg Nebulization BID   vitamin C   1,000 mg Oral Daily   aspirin  EC  81 mg Oral Daily   atorvastatin   40 mg Oral QHS   budesonide   0.5 mg Nebulization BID   cholecalciferol   2,000 Units Oral Daily   digoxin   125 mcg Oral Daily   doxycycline   100 mg Oral Q12H   enoxaparin  (LOVENOX ) injection  40 mg Subcutaneous QHS   Ensifentrine   1 ampule Nebulization BID   furosemide   40 mg Oral Daily   guaiFENesin   1,200 mg Oral Daily   insulin  aspart  0-15 Units Subcutaneous TID AC & HS   ipratropium-albuterol   3 mL Nebulization Q6H   levothyroxine   25 mcg Oral QAC breakfast   melatonin  12 mg Oral QHS   multivitamin with minerals  1 tablet Oral Q breakfast   pantoprazole   40 mg Oral BID   [START ON 11/22/2024] predniSONE   40 mg Oral Q breakfast   revefenacin   175 mcg Nebulization Daily   sertraline   100 mg Oral Daily   sodium chloride  flush  3 mL Intravenous Q12H   Continuous Infusions: PRN Meds:.acetaminophen  **OR** acetaminophen , albuterol , ALPRAZolam , ondansetron  **OR** ondansetron  (ZOFRAN ) IV, mouth rinse, polyethylene glycol  Antimicrobials: Anti-infectives (From admission, onward)    Start     Dose/Rate Route Frequency Ordered Stop   11/20/24 2200  doxycycline  (VIBRA -TABS) tablet 100 mg        100 mg Oral Every 12 hours 11/20/24 1851 11/25/24 2159   11/20/24 1445  ceFEPIme  (MAXIPIME ) 2 g in sodium chloride  0.9 % 100 mL IVPB        2 g 200 mL/hr over 30 Minutes Intravenous  Once 11/20/24 1433 11/20/24 1528        I have personally reviewed the following labs and images: CBC: Recent Labs  Lab 11/20/24 1234 11/20/24 1246 11/21/24 0351  WBC 15.4*  --  10.1  NEUTROABS 14.2*  --  9.5*  HGB 12.0* 13.3 10.8*  HCT 40.1 39.0 36.3*  MCV 81.0  --  81.4  PLT 266  --  229   BMP &GFR Recent Labs  Lab 11/20/24 1234 11/20/24 1246 11/21/24 0351  NA 142 138 144  K 4.2 4.1 5.0  CL 96*  --  101  CO2 33*  --  36*  GLUCOSE 270*  --  189*  BUN 18  --  18  CREATININE  1.00  --  0.85  CALCIUM  10.0  --  9.7  MG  --   --  2.9*   Estimated Creatinine Clearance: 61.3 mL/min (by C-G formula based on SCr of 0.85 mg/dL). Liver & Pancreas: Recent Labs  Lab 11/21/24 0351  AST 24  ALT 52*  ALKPHOS 72  BILITOT 0.2  PROT 6.3*  ALBUMIN  3.7   No results for input(s): LIPASE, AMYLASE in the last 168 hours. No results for input(s): AMMONIA in the last 168 hours. Diabetic: No results for input(s): HGBA1C in the last 72 hours. Recent Labs  Lab 11/21/24 0800 11/21/24 1157  GLUCAP 143* 150*   Cardiac Enzymes: No results for input(s): CKTOTAL, CKMB, CKMBINDEX, TROPONINI in the last 168 hours. Recent Labs  10/14/24 2124 11/05/24 1111 11/20/24 1234  PROBNP 262.0 181.0 242.0   Coagulation Profile: No results for input(s): INR, PROTIME in the last 168 hours. Thyroid  Function Tests: No results for input(s): TSH, T4TOTAL, FREET4, T3FREE, THYROIDAB in the last 72 hours. Lipid Profile: No results for input(s): CHOL, HDL, LDLCALC, TRIG, CHOLHDL, LDLDIRECT in the last 72 hours. Anemia Panel: No results for input(s): VITAMINB12, FOLATE, FERRITIN, TIBC, IRON, RETICCTPCT in the last 72 hours. Urine analysis:    Component Value Date/Time   COLORURINE YELLOW 08/20/2023 1401   APPEARANCEUR CLEAR 08/20/2023 1401   LABSPEC <=1.005 08/20/2023 1401   PHURINE 5.5 08/20/2023 1401   GLUCOSEU >=500 (A) 08/20/2023 1401   HGBUR NEGATIVE 08/20/2023 1401   BILIRUBINUR NEGATIVE 08/20/2023 1401   BILIRUBINUR Negative 05/10/2021 0931   KETONESUR NEGATIVE 08/20/2023 1401   PROTEINUR NEGATIVE 08/20/2023 1401   UROBILINOGEN negative (A) 05/10/2021 0931   NITRITE NEGATIVE 08/20/2023 1401   LEUKOCYTESUR NEGATIVE 08/20/2023 1401   Sepsis Labs: Invalid input(s): PROCALCITONIN, LACTICIDVEN  Microbiology: Recent Results (from the past 240 hours)  Resp panel by RT-PCR (RSV, Flu A&B, Covid) Anterior Nasal Swab      Status: None   Collection Time: 11/20/24 12:34 PM   Specimen: Anterior Nasal Swab  Result Value Ref Range Status   SARS Coronavirus 2 by RT PCR NEGATIVE NEGATIVE Final    Comment: (NOTE) SARS-CoV-2 target nucleic acids are NOT DETECTED.  The SARS-CoV-2 RNA is generally detectable in upper respiratory specimens during the acute phase of infection. The lowest concentration of SARS-CoV-2 viral copies this assay can detect is 138 copies/mL. A negative result does not preclude SARS-Cov-2 infection and should not be used as the sole basis for treatment or other patient management decisions. A negative result may occur with  improper specimen collection/handling, submission of specimen other than nasopharyngeal swab, presence of viral mutation(s) within the areas targeted by this assay, and inadequate number of viral copies(<138 copies/mL). A negative result must be combined with clinical observations, patient history, and epidemiological information. The expected result is Negative.  Fact Sheet for Patients:  bloggercourse.com  Fact Sheet for Healthcare Providers:  seriousbroker.it  This test is no t yet approved or cleared by the [place]  FDA and  has been authorized for detection and/or diagnosis of SARS-CoV-2 by FDA under an Emergency Use Authorization (EUA). This EUA will remain  in effect (meaning this test can be used) for the duration of the COVID-19 declaration under Section 564(b)(1) of the Act, 21 U.S.C.section 360bbb-3(b)(1), unless the authorization is terminated  or revoked sooner.       Influenza A by PCR NEGATIVE NEGATIVE Final   Influenza B by PCR NEGATIVE NEGATIVE Final    Comment: (NOTE) The Xpert Xpress SARS-CoV-2/FLU/RSV plus assay is intended as an aid in the diagnosis of influenza from Nasopharyngeal swab specimens and should not be used as a sole basis for treatment. Nasal washings and aspirates are  unacceptable for Xpert Xpress SARS-CoV-2/FLU/RSV testing.  Fact Sheet for Patients: bloggercourse.com  Fact Sheet for Healthcare Providers: seriousbroker.it  This test is not yet approved or cleared by the [place]  FDA and has been authorized for detection and/or diagnosis of SARS-CoV-2 by FDA under an Emergency Use Authorization (EUA). This EUA will remain in effect (meaning this test can be used) for the duration of the COVID-19 declaration under Section 564(b)(1) of the Act, 21 U.S.C. section 360bbb-3(b)(1), unless the authorization is terminated or revoked.     Resp Syncytial Virus by PCR NEGATIVE  NEGATIVE Final    Comment: (NOTE) Fact Sheet for Patients: bloggercourse.com  Fact Sheet for Healthcare Providers: seriousbroker.it  This test is not yet approved or cleared by the [place]  FDA and has been authorized for detection and/or diagnosis of SARS-CoV-2 by FDA under an Emergency Use Authorization (EUA). This EUA will remain in effect (meaning this test can be used) for the duration of the COVID-19 declaration under Section 564(b)(1) of the Act, 21 U.S.C. section 360bbb-3(b)(1), unless the authorization is terminated or revoked.  Performed at [facility], [address] [place], [place] [address]   Respiratory (~20 pathogens) panel by PCR     Status: None   Collection Time: 11/20/24  7:03 PM   Specimen: Nasopharyngeal Swab; Respiratory  Result Value Ref Range Status   Adenovirus NOT DETECTED NOT DETECTED Final   Coronavirus 229E NOT DETECTED NOT DETECTED Final    Comment: (NOTE) The Coronavirus on the Respiratory Panel, DOES NOT test for the novel  Coronavirus (2019 nCoV)    Coronavirus HKU1 NOT DETECTED NOT DETECTED Final   Coronavirus NL63 NOT DETECTED NOT DETECTED Final   Coronavirus OC43 NOT DETECTED NOT DETECTED Final   Metapneumovirus  NOT DETECTED NOT DETECTED Final   Rhinovirus / Enterovirus NOT DETECTED NOT DETECTED Final   Influenza A NOT DETECTED NOT DETECTED Final   Influenza B NOT DETECTED NOT DETECTED Final   Parainfluenza Virus 1 NOT DETECTED NOT DETECTED Final   Parainfluenza Virus 2 NOT DETECTED NOT DETECTED Final   Parainfluenza Virus 3 NOT DETECTED NOT DETECTED Final   Parainfluenza Virus 4 NOT DETECTED NOT DETECTED Final   Respiratory Syncytial Virus NOT DETECTED NOT DETECTED Final   Bordetella pertussis NOT DETECTED NOT DETECTED Final   Bordetella Parapertussis NOT DETECTED NOT DETECTED Final   Chlamydophila pneumoniae NOT DETECTED NOT DETECTED Final   Mycoplasma pneumoniae NOT DETECTED NOT DETECTED Final    Comment: Performed at [facility], [address], [place], [place] [address]  Expectorated Sputum Assessment w Gram Stain, Rflx to Resp Cult     Status: None   Collection Time: 11/21/24  5:09 AM   Specimen: Expectorated Sputum  Result Value Ref Range Status   Specimen Description EXPECTORATED SPUTUM  Final   Special Requests NONE  Final   Sputum evaluation   Final    THIS SPECIMEN IS ACCEPTABLE FOR SPUTUM CULTURE Performed at [facility], [address], [place], [place] [address]    Report Status 11/21/2024 FINAL  Final    Radiology Studies: DG Chest Port 1 View Result Date: 11/20/2024 EXAM: 1 VIEW(S) XRAY OF THE CHEST 11/20/2024 12:48:00 PM COMPARISON: 11/05/2024 CLINICAL HISTORY: dib dib FINDINGS: LUNGS AND PLEURA: No focal pulmonary opacity. No pleural effusion. No pneumothorax. HEART AND MEDIASTINUM: No acute abnormality of the cardiac and mediastinal silhouettes. BONES AND SOFT TISSUES: No acute osseous abnormality. IMPRESSION: 1. No acute cardiopulmonary process. Electronically signed by: Lynwood Seip MD 11/20/2024 01:10 PM EST RP Workstation: [id number]      Ujbz T. Ayah Cozzolino [facility]  If 7PM-7AM, please contact  night-coverage [email or web address] 11/21/2024, 12:34 PM

## 2024-11-21 NOTE — Care Management CC44 (Signed)
 Condition Code 44 Documentation Completed  Patient Details  Name: Paul [name] MRN: [id number] Date of Birth: 1946-03-19   Condition Code 44 given:  Yes Patient signature on Condition Code 44 notice:  Yes Documentation of 2 MD's agreement:  Yes Code 44 added to claim:  Yes    Christa Fasig A Stephane Junkins, LCSW 11/21/2024, 11:03 AM

## 2024-11-21 NOTE — Assessment & Plan Note (Signed)
 Patient is presented for hospitalization 7 times in just over 2 months Current presentation is similar to prior presentations and seems to occur despite patient's continued compliance with an aggressive respiratory regimen in the outpatient setting Patient was supposed to be on Ohtuvayre  at this point based on his last pulmonary note 11/24 but per my discussions with the wife he has still yet to receive this medication.  Patient is currently taking Arformeterol, Budesonide  and Yupelri  Considering patient's recurrent exacerbations and what seems to be refractory COPD, will obtain pulmonology consultation in the morning. Treating with aggressive bronchodilator therapy, Solu-Medrol  and doxycycline  at this point Titrating supplemental oxygen  to achieve oxygen  saturations of 89 to 92%

## 2024-11-21 NOTE — Assessment & Plan Note (Signed)
 BNP consistent with prior values Euvolemic on exam

## 2024-11-21 NOTE — Assessment & Plan Note (Signed)
 Not on anticoagulation due to bleeding complications in the past  status post Watchman device Continuing digoxin  Monitoring on telemetry Currently rate controlled

## 2024-11-21 NOTE — Progress Notes (Signed)
 SATURATION QUALIFICATIONS: (This note is used to comply with regulatory documentation for home oxygen )  Patient Saturations on Room Air at Rest = 87%  Patient Saturations on Room Air while Ambulating = 87%  Patient Saturations on 2 Liters of oxygen  while Ambulating = 91%  Please briefly explain why patient needs home oxygen :Needs to remain on 2 L at this time to keep oxygen  saturation above 88%

## 2024-11-21 NOTE — Assessment & Plan Note (Signed)
-   CPAP nightly

## 2024-11-21 NOTE — Assessment & Plan Note (Signed)
.   Patient been placed on Accu-Cheks before every meal and nightly with sliding scale insulin . Holding home regimen of hypoglycemics . Hemoglobin A1C ordered . Diabetic Diet  

## 2024-11-21 NOTE — Assessment & Plan Note (Signed)
 Continue home regimen of amlodipine  As needed intravenous angiotensins for markedly elevated blood pressure

## 2024-11-21 NOTE — Assessment & Plan Note (Signed)
-

## 2024-11-21 NOTE — Progress Notes (Signed)
   11/21/24 2242  BiPAP/CPAP/SIPAP  BiPAP/CPAP/SIPAP Pt Type Adult  BiPAP/CPAP/SIPAP Resmed  Mask Type Nasal mask  Dentures removed? Not applicable  Mask Size Small  EPAP 9 cmH2O (Home setting)  Flow Rate 2 lpm  Patient Home Machine No  Patient Home Mask No  Patient Home Tubing No  Device Plugged into RED Power Outlet Yes  BiPAP/CPAP /SiPAP Vitals  Resp 16  MEWS Score/Color  MEWS Score 0  MEWS Score Color Green

## 2024-11-21 NOTE — Progress Notes (Signed)
 Mobility Specialist - Progress Note  (NC2L) Pre-mobility: 93 bpm HR, 97% SpO2 During mobility: 99 bpm HR, 89% SpO2 Post-mobility: 99 bpm HR, 92% SPO2   11/21/24 1420  Mobility  Activity Ambulated independently  Level of Assistance Modified independent, requires aide device or extra time  Assistive Device None  Distance Ambulated (ft) 300 ft  Range of Motion/Exercises Active  Activity Response Tolerated fair  Mobility visit 1 Mobility  Mobility Specialist Start Time (ACUTE ONLY) 1408  Mobility Specialist Stop Time (ACUTE ONLY) 1420  Mobility Specialist Time Calculation (min) (ACUTE ONLY) 12 min   Pt was found on recliner chair and agreeable to mobilize. Grew SOB with ambulation and had x1 brief standing rest break. At EOS returned recliner chair with all needs met. Call bell in reach.   Paul [name],  Mobility Specialist Can be reached via Secure Chat

## 2024-11-21 NOTE — Assessment & Plan Note (Signed)
Continue home regimen of Lipitor

## 2024-11-22 DIAGNOSIS — E1169 Type 2 diabetes mellitus with other specified complication: Secondary | ICD-10-CM | POA: Diagnosis present

## 2024-11-22 DIAGNOSIS — I5032 Chronic diastolic (congestive) heart failure: Secondary | ICD-10-CM | POA: Diagnosis present

## 2024-11-22 DIAGNOSIS — J441 Chronic obstructive pulmonary disease with (acute) exacerbation: Secondary | ICD-10-CM | POA: Diagnosis present

## 2024-11-22 DIAGNOSIS — E782 Mixed hyperlipidemia: Secondary | ICD-10-CM | POA: Diagnosis present

## 2024-11-22 DIAGNOSIS — I480 Paroxysmal atrial fibrillation: Secondary | ICD-10-CM | POA: Diagnosis present

## 2024-11-22 DIAGNOSIS — J9621 Acute and chronic respiratory failure with hypoxia: Secondary | ICD-10-CM | POA: Diagnosis present

## 2024-11-22 DIAGNOSIS — G4733 Obstructive sleep apnea (adult) (pediatric): Secondary | ICD-10-CM | POA: Diagnosis present

## 2024-11-22 DIAGNOSIS — E039 Hypothyroidism, unspecified: Secondary | ICD-10-CM | POA: Diagnosis present

## 2024-11-22 DIAGNOSIS — F411 Generalized anxiety disorder: Secondary | ICD-10-CM | POA: Diagnosis present

## 2024-11-22 LAB — RENAL FUNCTION PANEL
Albumin: 3.5 g/dL (ref 3.5–5.0)
Anion gap: 10 (ref 5–15)
BUN: 24 mg/dL — ABNORMAL HIGH (ref 8–23)
CO2: 34 mmol/L — ABNORMAL HIGH (ref 22–32)
Calcium: 9.2 mg/dL (ref 8.9–10.3)
Chloride: 101 mmol/L (ref 98–111)
Creatinine, Ser: 1.22 mg/dL (ref 0.61–1.24)
GFR, Estimated: >60 mL/min (ref >60)
Glucose, Bld: 115 mg/dL — ABNORMAL HIGH (ref 70–99)
Phosphorus: 3.2 mg/dL (ref 2.5–4.6)
Potassium: 4.5 mmol/L (ref 3.5–5.1)
Sodium: 145 mmol/L (ref 135–145)

## 2024-11-22 LAB — GLUCOSE, CAPILLARY
Glucose-Capillary: 104 mg/dL — ABNORMAL HIGH (ref 70–99)
Glucose-Capillary: 169 mg/dL — ABNORMAL HIGH (ref 70–99)
Glucose-Capillary: 228 mg/dL — ABNORMAL HIGH (ref 70–99)
Glucose-Capillary: 168 mg/dL — ABNORMAL HIGH (ref 70–99)

## 2024-11-22 LAB — HEMOGLOBIN A1C
Hgb A1c MFr Bld: 7.4 % — ABNORMAL HIGH (ref 4.8–5.6)
Mean Plasma Glucose: 166 mg/dL

## 2024-11-22 LAB — MAGNESIUM: Magnesium: 2.3 mg/dL (ref 1.7–2.4)

## 2024-11-22 NOTE — Progress Notes (Signed)
 NAME:  Paul [name], MRN:  [id number], DOB:  19-Jul-1946, LOS: 1 ADMISSION DATE:  11/20/2024, CONSULTATION DATE:  11/21/24 REFERRING MD:  Dr Kathrin, CHIEF COMPLAINT:  Shortness of braeth   History of Present Illness:  This is the seventh hospitalization in the last 6 months for this 78 year old man with severe COPD, steroid-dependent, chronic respiratory failure on oxygen  PFTs 01/2024 showed FEV1 of 33%, ratio 49, diffusion capacity 41% He is maintained on triple therapy nebs with budesonide , Brovana  and Yupelri .  Previously was on Breztri .  He has been maintained on prednisone  for the last few months. Recent CT chest 09/2024 showed diffuse emphysema He has not tolerated Daliresp  in the past due to GI side effects.  Previous trial with Nucala  caused 2 hospitalizations.  Azithromycin  thrice weekly was considered but held due to potential interaction with digoxin .  He has not qualified for bilevel due to pCO2 less than 50  and the stable outpatient state.  For severe anxiety he is maintained on alprazolam  and sertraline  On his last office visit 11/24, esfentrine was initiated and he is awaiting delivery He was recently hospitalized 11/14 to 11/17.  He is now back with shortness of breath, cough productive of yellow/green sputum.  This started within hours.  He self treated with neb x 1, Xanax .  In the ED, he was treated with prolonged neb, cefepime  and magnesium  and after hospitalization switch to Solu-Medrol  doxycycline  and bronchodilator therapy  Pertinent  Medical History  HFpEF Atrial fibrillation not on anticoagulation due to severe hematuria and poor balance OSA on CPAP with oxygen  greater than  Significant Hospital Events: Including procedures, antibiotic start and stop dates in addition to other pertinent events   11/30 PCCM consulted  Interim History / Subjective:  Still feeling a little winded Trying to move around some Does not feel as good today as what he felt like  yesterday  Objective    Blood pressure (!) 136/53, pulse 78, temperature 98.3 F (36.8 C), temperature source Oral, resp. rate 19, height 5' 5 (1.651 m), weight 70.6 kg, SpO2 95%.        Intake/Output Summary (Last 24 hours) at 11/22/2024 1357 Last data filed at 11/22/2024 0559 Gross per 24 hour  Intake 420 ml  Output 0 ml  Net 420 ml   Filed Weights   11/20/24 1227  Weight: 70.6 kg    Examination: General: Elderly gentleman, does not appear to be in distress HENT: Moist oral mucosa Lungs: Decreased air movement at the bases bilaterally, no rhonchi Cardiovascular: S1-S2 appreciated Abdomen: Soft, bowel sounds appreciated Extremities: No clubbing, no edema Neuro: Awake alert oriented GU:   I reviewed last 24 h vitals and pain scores, last 48 h intake and output, last 24 h labs and trends, and last 24 h imaging results.  Chest x-ray with no infiltrate  7.43/51/80  Resolved problem list   Assessment and Plan   COPD exacerbation Anxiety  Did not have any symptoms suggesting a bacterial infection prior to coming in anxiety is felt to be playing a role in his exacerbations  He does use Seroquel at night and Xanax  as needed during the day  Follows up regularly in the office  pCO2 level not significantly higher Continue bronchodilators Continue oxygen  supplementation  Not ready for discharge  Will benefit from Ensifentrine  Regular follow-up for optimization of treatment as outpatient  I did discuss inspiratory muscle trainers with him during a visit today   Labs   CBC: Recent Labs  Lab  11/20/24 1234 11/20/24 1246 11/21/24 0351  WBC 15.4*  --  10.1  NEUTROABS 14.2*  --  9.5*  HGB 12.0* 13.3 10.8*  HCT 40.1 39.0 36.3*  MCV 81.0  --  81.4  PLT 266  --  229    Basic Metabolic Panel: Recent Labs  Lab 11/20/24 1234 11/20/24 1246 11/21/24 0351 11/22/24 0346  NA 142 138 144 145  K 4.2 4.1 5.0 4.5  CL 96*  --  101 101  CO2 33*  --  36* 34*   GLUCOSE 270*  --  189* 115*  BUN 18  --  18 24*  CREATININE 1.00  --  0.85 1.22  CALCIUM  10.0  --  9.7 9.2  MG  --   --  2.9* 2.3  PHOS  --   --   --  3.2   GFR: Estimated Creatinine Clearance: 42.7 mL/min (by C-G formula based on SCr of 1.22 mg/dL). Recent Labs  Lab 11/20/24 1234 11/21/24 0351  WBC 15.4* 10.1    Liver Function Tests: Recent Labs  Lab 11/21/24 0351 11/22/24 0346  AST 24  --   ALT 52*  --   ALKPHOS 72  --   BILITOT 0.2  --   PROT 6.3*  --   ALBUMIN  3.7 3.5   No results for input(s): LIPASE, AMYLASE in the last 168 hours. No results for input(s): AMMONIA in the last 168 hours.  ABG    Component Value Date/Time   PHART 7.43 11/21/2024 1223   PCO2ART 51 (H) 11/21/2024 1223   PO2ART 80 (L) 11/21/2024 1223   HCO3 33.9 (H) 11/21/2024 1223   TCO2 41 (H) 11/20/2024 1246   ACIDBASEDEF 0.7 07/16/2022 2310   O2SAT 96.7 11/21/2024 1223     Coagulation Profile: No results for input(s): INR, PROTIME in the last 168 hours.  Cardiac Enzymes: No results for input(s): CKTOTAL, CKMB, CKMBINDEX, TROPONINI in the last 168 hours.  HbA1C: HbA1c POC (<> result, manual entry)  Date/Time Value Ref Range Status  10/14/2024 10:27 AM 6.8 4.0 - 5.6 % Final   Hgb A1c MFr Bld  Date/Time Value Ref Range Status  11/21/2024 03:51 AM 7.4 (H) 4.8 - 5.6 % Final    Comment:    (NOTE)         Prediabetes: 5.7 - 6.4         Diabetes: >6.4         Glycemic control for adults with diabetes: <7.0   07/05/2024 08:02 AM 6.3 (H) 4.8 - 5.6 % Final    Comment:             Prediabetes: 5.7 - 6.4          Diabetes: >6.4          Glycemic control for adults with diabetes: <7.0     CBG: Recent Labs  Lab 11/21/24 1157 11/21/24 1619 11/21/24 2024 11/22/24 0731 11/22/24 1138  GLUCAP 150* 157* 118* 104* 169*    Review of Systems:   Cough, shortness of breath  Past Medical History:  He,  has a past medical history of Anemia, Aortic atherosclerosis,  Aspiration pneumonia due to inhalation of vomitus (HCC) (02/03/2022), Atrial fibrillation (HCC), Back pain, COPD (chronic obstructive pulmonary disease) (HCC), COPD exacerbation (HCC) (12/27/2021), COVID-19 (12/24/2020), Diabetes mellitus without complication (HCC), Dizziness (04/08/2022), Elevated PSA, GAD (generalized anxiety disorder), GERD (gastroesophageal reflux disease), High cholesterol, Hypertension, Lingular pneumonia (01/05/2021), Lung nodule < 6cm on CT (05/29/2016), Osteoporosis, Oxygen  deficiency, Pneumonia, 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   Surgical History:   Past Surgical History:  Procedure Laterality Date   CATARACT EXTRACTION Right    ELBOW SURGERY  07/2022   surgery on olecranon bursa. Dr. Alyse   IR KYPHO EA ADDL LEVEL THORACIC OR LUMBAR  11/28/2021   LEFT ATRIAL APPENDAGE OCCLUSION N/A 08/02/2021   Procedure: LEFT ATRIAL APPENDAGE OCCLUSION;  Surgeon: Wonda Sharper, MD;  Location: [facility] INVASIVE CV LAB;  Service: Cardiovascular;  Laterality: N/A;   SKIN SURGERY     shoulders and chest   TEE WITHOUT CARDIOVERSION N/A 08/02/2021   Procedure: TRANSESOPHAGEAL ECHOCARDIOGRAM (TEE);  Surgeon: Wonda Sharper, MD;  Location: [facility] INVASIVE CV LAB;  Service: Cardiovascular;  Laterality: N/A;   TEE WITHOUT CARDIOVERSION N/A 09/13/2021   Procedure: TRANSESOPHAGEAL ECHOCARDIOGRAM (TEE);  Surgeon: Francyne Headland, MD;  Location: [facility] ENDOSCOPY;  Service: Cardiovascular;  Laterality: N/A;     Social History:   reports that he quit smoking about 15 years ago. His smoking use included cigarettes. He started smoking about 67 years ago. He has a 104 pack-year smoking history. He has never used smokeless tobacco. He reports that he does not currently use alcohol. He reports that he does not use drugs.   Family History:  His family history includes Cancer in his paternal  uncle; Heart disease in his brother, father, and mother.   Allergies Allergies  Allergen Reactions   Tape Other (See Comments)    PLEASE USE COBAN WRAP IN LIEU OF TAPE, as it pulls off the skin!!   Daliresp  [Roflumilast ] Diarrhea and Nausea And Vomiting   Levaquin  [Levofloxacin ] Palpitations and Other (See Comments)    Made the B/P fluctuate and heart raced   Alendronate Anxiety and Other (See Comments)    Jittery/nervous     Jennet Epley, MD [place] PCCM Pager: See Tracey

## 2024-11-22 NOTE — Progress Notes (Signed)
 PROGRESS NOTE  AMAAN MEYER FMW:[id number] DOB: [birth year]   PCP: [name], MD  Patient is from: Home.  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  6 admission in about 2 months.  The next day, pulmonology consulted.   Subjective: Seen and examined earlier this morning.  No major events overnight or this morning.  Continues to endorse SOB and DOE.  Reports desaturating to 70s when he walked to the bathroom.  Also reports productive cough with greenish phlegm.  Denies chest pain.   Assessment and plan: Acute respiratory failure with hypoxia: Desaturated to 87% with ambulation on RA.  Cover to 91% on 2 L. Chronic hypercapnic respiratory failure COPD with acute exacerbation-six admission in 2 months.  Compliant with meds.  RVP and CXR negative. -Still with productive cough, SOB and DOE.  Diminished aeration bilaterally. -Continue steroid, doxycycline , scheduled and as needed nebulizers -Follow sputum cultures. -Appreciate help by pulmonology. -Titrate oxygen  for goal saturation between 89% and 92%. -Incentive spirometry, OOB, ambulatory saturation, PT, OT  Chronic diastolic CHF: TTE on 11/6 with LVEF of 60 to 65% indeterminate DD and normal RVSF.  Appears euvolemic on exam.  CXR without acute finding. -Continue p.o. Lasix  40 mg daily -Strict intake and output, daily weight  Paroxysmal atrial fibrillation s/p Watchman device.  Seems to be in sinus rhythm. -Continue home digoxin  -Optimize electrolytes.  DM type 2 with diabetic mixed hyperlipidemia: A1c 6.8%.  On  Farxiga  at home. Recent Labs  Lab 11/21/24 0800 11/21/24 1157 11/21/24 1619 11/21/24 2024 11/22/24 0731  GLUCAP 143* 150* 157* 118* 104*  - Continue SSI-moderate - Continue Farxiga   Essential hypertension: Normotensive. -Continue amlodipine   Hypothyroidism - Continue home Synthroid .  GERD without esophagitis -Continue Protonix  twice daily  Generalized anxiety disorder -Continue sertraline  and as needed Xanax   OSA on CPAP -CPAP nightly  Body mass index is 25.89 kg/m.           DVT prophylaxis:  enoxaparin  (LOVENOX ) injection 40 mg Start: 11/20/24 2200  Code Status: Full code Family Communication: None at bedside Level of care: Progressive Status is: Observation The patient will require care spanning > 2 midnights and should be moved to inpatient because: Acute respiratory failure with hypoxia, COPD exacerbation   Final disposition: Home once medically stable.   55 minutes with more than 50% spent in reviewing records, counseling patient/family and coordinating care.  Consultants:  Pulmonology  Procedures: None  Microbiology summarized: COVID-19, influenza and RSV PCR nonreactive 20 pathogen RVP nonreactive Sputum culture pending  Objective: Vitals:   11/22/24 0328 11/22/24 0828 11/22/24 0838 11/22/24 0839  BP: (!) 136/53     Pulse: 78     Resp: 19     Temp:      TempSrc:      SpO2: 94% 94% 94% 95%  Weight:      Height:        Examination:  GENERAL: No apparent distress.  Nontoxic. HEENT: MMM.  Vision and hearing grossly intact.  NECK: Supple.  No apparent JVD.  RESP: Expiratory wheeze.  Diminished aeration bilaterally. CVS:  RRR. Heart sounds normal.  ABD/GI/GU: BS+. Abd soft, NTND.  MSK/EXT:  Moves extremities. No apparent deformity. No edema.  SKIN: no apparent skin lesion or wound NEURO: AA.  Oriented appropriately.  No apparent focal neuro deficit. PSYCH: Calm. Normal affect.   Sch Meds:  Scheduled Meds:  amLODipine   5 mg  Oral Daily   arformoterol   15 mcg Nebulization BID   vitamin C   1,000 mg Oral Daily   aspirin  EC  81 mg Oral Daily   atorvastatin   40 mg Oral QHS   budesonide   0.5 mg Nebulization BID   cholecalciferol   2,000 Units Oral Daily   dapagliflozin  propanediol  10 mg Oral Daily   digoxin   125 mcg Oral Daily   doxycycline   100 mg Oral Q12H   enoxaparin  (LOVENOX ) injection  40 mg Subcutaneous QHS   Ensifentrine   1 ampule Nebulization BID   furosemide   40 mg Oral Daily   guaiFENesin   1,200 mg Oral Daily   insulin  aspart  0-15 Units Subcutaneous TID AC & HS   ipratropium-albuterol   3 mL Nebulization Q6H   levothyroxine   25 mcg Oral QAC breakfast   melatonin  12 mg Oral QHS   multivitamin with minerals  1 tablet Oral Q breakfast   pantoprazole   40 mg Oral BID   predniSONE   40 mg Oral Q breakfast   revefenacin   175 mcg Nebulization Daily   sertraline   100 mg Oral Daily   sodium chloride  flush  3 mL Intravenous Q12H   Continuous Infusions: PRN Meds:.acetaminophen  **OR** acetaminophen , albuterol , ALPRAZolam , ondansetron  **OR** ondansetron  (ZOFRAN ) IV, mouth rinse, polyethylene glycol  Antimicrobials: Anti-infectives (From admission, onward)    Start     Dose/Rate Route Frequency Ordered Stop   11/20/24 2200  doxycycline  (VIBRA -TABS) tablet 100 mg        100 mg Oral Every 12 hours 11/20/24 1851 11/25/24 2159   11/20/24 1445  ceFEPIme  (MAXIPIME ) 2 g in sodium chloride  0.9 % 100 mL IVPB        2 g 200 mL/hr over 30 Minutes Intravenous  Once 11/20/24 1433 11/20/24 1528        I have personally reviewed the following labs and images: CBC: Recent Labs  Lab 11/20/24 1234 11/20/24 1246 11/21/24 0351  WBC 15.4*  --  10.1  NEUTROABS 14.2*  --  9.5*  HGB 12.0* 13.3 10.8*  HCT 40.1 39.0 36.3*  MCV 81.0  --  81.4  PLT 266  --  229   BMP &GFR Recent Labs  Lab 11/20/24 1234 11/20/24 1246 11/21/24 0351 11/22/24 0346  NA 142 138 144 145  K 4.2 4.1 5.0 4.5  CL 96*  --  101 101  CO2  33*  --  36* 34*  GLUCOSE 270*  --  189* 115*  BUN 18  --  18 24*  CREATININE 1.00  --  0.85 1.22  CALCIUM  10.0  --  9.7 9.2  MG  --   --  2.9* 2.3  PHOS  --   --   --  3.2   Estimated Creatinine Clearance: 42.7 mL/min (by C-G formula based on SCr of 1.22 mg/dL). Liver & Pancreas: Recent Labs  Lab 11/21/24 0351 11/22/24 0346  AST 24  --   ALT 52*  --   ALKPHOS 72  --   BILITOT 0.2  --   PROT 6.3*  --   ALBUMIN  3.7 3.5   No results for input(s): LIPASE, AMYLASE in the last 168 hours. No  results for input(s): AMMONIA in the last 168 hours. Diabetic: Recent Labs    11/21/24 0351  HGBA1C 7.4*   Recent Labs  Lab 11/21/24 0800 11/21/24 1157 11/21/24 1619 11/21/24 2024 11/22/24 0731  GLUCAP 143* 150* 157* 118* 104*   Cardiac Enzymes: No results for input(s): CKTOTAL, CKMB, CKMBINDEX, TROPONINI in the last 168 hours. Recent Labs    10/14/24 2124 11/05/24 1111 11/20/24 1234  PROBNP 262.0 181.0 242.0   Coagulation Profile: No results for input(s): INR, PROTIME in the last 168 hours. Thyroid  Function Tests: No results for input(s): TSH, T4TOTAL, FREET4, T3FREE, THYROIDAB in the last 72 hours. Lipid Profile: No results for input(s): CHOL, HDL, LDLCALC, TRIG, CHOLHDL, LDLDIRECT in the last 72 hours. Anemia Panel: No results for input(s): VITAMINB12, FOLATE, FERRITIN, TIBC, IRON, RETICCTPCT in the last 72 hours. Urine analysis:    Component Value Date/Time   COLORURINE YELLOW 08/20/2023 1401   APPEARANCEUR CLEAR 08/20/2023 1401   LABSPEC <=1.005 08/20/2023 1401   PHURINE 5.5 08/20/2023 1401   GLUCOSEU >=500 (A) 08/20/2023 1401   HGBUR NEGATIVE 08/20/2023 1401   BILIRUBINUR NEGATIVE 08/20/2023 1401   BILIRUBINUR Negative 05/10/2021 0931   KETONESUR NEGATIVE 08/20/2023 1401   PROTEINUR NEGATIVE 08/20/2023 1401   UROBILINOGEN negative (A) 05/10/2021 0931   NITRITE NEGATIVE 08/20/2023 1401   LEUKOCYTESUR NEGATIVE  08/20/2023 1401   Sepsis Labs: Invalid input(s): PROCALCITONIN, LACTICIDVEN  Microbiology: Recent Results (from the past 240 hours)  Resp panel by RT-PCR (RSV, Flu A&B, Covid) Anterior Nasal Swab     Status: None   Collection Time: 11/20/24 12:34 PM   Specimen: Anterior Nasal Swab  Result Value Ref Range Status   SARS Coronavirus 2 by RT PCR NEGATIVE NEGATIVE Final    Comment: (NOTE) SARS-CoV-2 target nucleic acids are NOT DETECTED.  The SARS-CoV-2 RNA is generally detectable in upper respiratory specimens during the acute phase of infection. The lowest concentration of SARS-CoV-2 viral copies this assay can detect is 138 copies/mL. A negative result does not preclude SARS-Cov-2 infection and should not be used as the sole basis for treatment or other patient management decisions. A negative result may occur with  improper specimen collection/handling, submission of specimen other than nasopharyngeal swab, presence of viral mutation(s) within the areas targeted by this assay, and inadequate number of viral copies(<138 copies/mL). A negative result must be combined with clinical observations, patient history, and epidemiological information. The expected result is Negative.  Fact Sheet for Patients:  bloggercourse.com  Fact Sheet for Healthcare Providers:  seriousbroker.it  This test is no t yet approved or cleared by the [place]  FDA and  has been authorized for detection and/or diagnosis of SARS-CoV-2 by FDA under an Emergency Use Authorization (EUA). This EUA will remain  in effect (meaning this test can be used) for the duration of the COVID-19 declaration under Section 564(b)(1) of the Act, 21 U.S.C.section 360bbb-3(b)(1), unless the authorization is terminated  or revoked sooner.       Influenza A by PCR NEGATIVE NEGATIVE Final   Influenza B by PCR NEGATIVE NEGATIVE Final    Comment: (NOTE) The Xpert Xpress  SARS-CoV-2/FLU/RSV plus assay is intended as an aid in the diagnosis of influenza from Nasopharyngeal swab specimens and should not be used as a sole basis for treatment. Nasal washings and aspirates are unacceptable for Xpert Xpress SARS-CoV-2/FLU/RSV testing.  Fact Sheet for Patients: bloggercourse.com  Fact Sheet for Healthcare Providers: seriousbroker.it  This test is not yet approved or cleared by the [place]   FDA and has been authorized for detection and/or diagnosis of SARS-CoV-2 by FDA under an Emergency Use Authorization (EUA). This EUA will remain in effect (meaning this test can be used) for the duration of the COVID-19 declaration under Section 564(b)(1) of the Act, 21 U.S.C. section 360bbb-3(b)(1), unless the authorization is terminated or revoked.     Resp Syncytial Virus by PCR NEGATIVE NEGATIVE Final    Comment: (NOTE) Fact Sheet for Patients: bloggercourse.com  Fact Sheet for Healthcare Providers: seriousbroker.it  This test is not yet approved or cleared by the [place]  FDA and has been authorized for detection and/or diagnosis of SARS-CoV-2 by FDA under an Emergency Use Authorization (EUA). This EUA will remain in effect (meaning this test can be used) for the duration of the COVID-19 declaration under Section 564(b)(1) of the Act, 21 U.S.C. section 360bbb-3(b)(1), unless the authorization is terminated or revoked.  Performed at [facility], [address] [place], [place] [address]   Respiratory (~20 pathogens) panel by PCR     Status: None   Collection Time: 11/20/24  7:03 PM   Specimen: Nasopharyngeal Swab; Respiratory  Result Value Ref Range Status   Adenovirus NOT DETECTED NOT DETECTED Final   Coronavirus 229E NOT DETECTED NOT DETECTED Final    Comment: (NOTE) The Coronavirus on the Respiratory Panel, DOES NOT test for the  novel  Coronavirus (2019 nCoV)    Coronavirus HKU1 NOT DETECTED NOT DETECTED Final   Coronavirus NL63 NOT DETECTED NOT DETECTED Final   Coronavirus OC43 NOT DETECTED NOT DETECTED Final   Metapneumovirus NOT DETECTED NOT DETECTED Final   Rhinovirus / Enterovirus NOT DETECTED NOT DETECTED Final   Influenza A NOT DETECTED NOT DETECTED Final   Influenza B NOT DETECTED NOT DETECTED Final   Parainfluenza Virus 1 NOT DETECTED NOT DETECTED Final   Parainfluenza Virus 2 NOT DETECTED NOT DETECTED Final   Parainfluenza Virus 3 NOT DETECTED NOT DETECTED Final   Parainfluenza Virus 4 NOT DETECTED NOT DETECTED Final   Respiratory Syncytial Virus NOT DETECTED NOT DETECTED Final   Bordetella pertussis NOT DETECTED NOT DETECTED Final   Bordetella Parapertussis NOT DETECTED NOT DETECTED Final   Chlamydophila pneumoniae NOT DETECTED NOT DETECTED Final   Mycoplasma pneumoniae NOT DETECTED NOT DETECTED Final    Comment: Performed at [facility], [address], [place], [place] [address]  Expectorated Sputum Assessment w Gram Stain, Rflx to Resp Cult     Status: None   Collection Time: 11/21/24  5:09 AM   Specimen: Expectorated Sputum  Result Value Ref Range Status   Specimen Description EXPECTORATED SPUTUM  Final   Special Requests NONE  Final   Sputum evaluation   Final    THIS SPECIMEN IS ACCEPTABLE FOR SPUTUM CULTURE Performed at [facility], [address], [place], [place] [address]    Report Status 11/21/2024 FINAL  Final  Culture, Respiratory w Gram Stain     Status: None (Preliminary result)   Collection Time: 11/21/24  5:09 AM  Result Value Ref Range Status   Specimen Description   Final    EXPECTORATED SPUTUM Performed at [facility], [address] [place], [place] [address]    Special Requests   Final    NONE Reflexed from [id number] Performed at [facility], [address] [place], [place] [address]    Gram Stain    Final    MODERATE WBC PRESENT, PREDOMINANTLY PMN FEW GRAM POSITIVE COCCI Performed  at [facility], [address] [place], [place] [address]    Culture PENDING  Incomplete   Report Status PENDING  Incomplete    Radiology Studies: No results found.     Daulton Harbaugh T. Coye Dawood [facility]  If 7PM-7AM, please contact night-coverage [email or web address] 11/22/2024, 10:31 AM

## 2024-11-22 NOTE — Progress Notes (Signed)
 Mobility Specialist - Progress Note  (NC2L) During mobility: HR, 88% SpO2 Post-mobility: 90% SPO2   11/22/24 1458  Mobility  Activity Ambulated independently  Level of Assistance Standby assist, set-up cues, supervision of patient - no hands on  Assistive Device None  Distance Ambulated (ft) 250 ft  Range of Motion/Exercises Active  Activity Response Tolerated fair  Mobility visit 1 Mobility  Mobility Specialist Start Time (ACUTE ONLY) 1440  Mobility Specialist Stop Time (ACUTE ONLY) 1450  Mobility Specialist Time Calculation (min) (ACUTE ONLY) 10 min   Pt was found getting ready to mobilize with wife. Assisted with hallway ambulation and had x1 seated rest break due to lightheadedness. Able to increase SPO2 within 1 min of seated break. At EOS returned to recliner chair with all needs met. Call bell in reach and wife in room.   [name],  Mobility Specialist Can be reached via Secure Chat

## 2024-11-22 NOTE — Progress Notes (Signed)
   11/22/24 2231  BiPAP/CPAP/SIPAP  BiPAP/CPAP/SIPAP Pt Type Adult  BiPAP/CPAP/SIPAP Resmed  Mask Type Nasal mask  Dentures removed? Not applicable  Mask Size Small  IPAP 9 cmH20 (per pt)  Flow Rate 2 lpm  Patient Home Machine No  Patient Home Mask No  Patient Home Tubing No  Auto Titrate No  Device Plugged into RED Power Outlet Yes  BiPAP/CPAP /SiPAP Vitals  Resp (!) 24  MEWS Score/Color  MEWS Score 1  MEWS Score Color Green

## 2024-11-22 NOTE — Plan of Care (Incomplete)
  Problem: Health Behavior/Discharge Planning: Goal: Ability to manage health-related needs will improve Outcome: Progressing   Problem: Clinical Measurements: Goal: Ability to maintain clinical measurements within normal limits will improve Outcome: Progressing Goal: Diagnostic test results will improve Outcome: Progressing Goal: Respiratory complications will improve Outcome: Progressing   Problem: Nutrition: Goal: Adequate nutrition will be maintained Outcome: Progressing   Problem: Coping: Goal: Level of anxiety will decrease Outcome: Progressing   Problem: Education: Goal: Knowledge of General Education information will improve Description: Including pain rating scale, medication(s)/side effects and non-pharmacologic comfort measures Outcome: Adequate for Discharge   Problem: Clinical Measurements: Goal: Will remain free from infection Outcome: Adequate for Discharge Goal: Cardiovascular complication will be avoided Outcome: Adequate for Discharge   Problem: Activity: Goal: Risk for activity intolerance will decrease Outcome: Adequate for Discharge   Problem: Elimination: Goal: Will not experience complications related to bowel motility Outcome: Adequate for Discharge Goal: Will not experience complications related to urinary retention Outcome: Adequate for Discharge

## 2024-11-22 NOTE — Progress Notes (Signed)
 PT Note  Patient Details Name: Paul [name] MRN: [id number] DOB: January 05, 1946   Cancelled Treatment:    Reason Eval/Treat Not Completed: PT screened, no needs identified, will sign off. Thank you for this referral.   [place], PT Acute Rehab   [place] VEAR Drone 11/22/2024, 10:01 AM

## 2024-11-22 NOTE — Evaluation (Signed)
 Occupational Therapy Evaluation Patient Details Name: Paul [name] MRN: [id number] DOB: 1946-08-14 Today's Date: 11/22/2024   History of Present Illness   Pt is a 78 y.o. male admitted with acute on chronic respiratory failure with hypoxia due to COPD exacerbation. 6th admission in 2 months for similar. 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 Prior to admission, pt was mod independent (increased time for rest breaks) for ADL + mobility performance without AD. SpO2 on 2L via [place] upon arrival. Pt dyspneic while talking, O2 increased to 3L with mobility, dropping to 87% on 3L after walking 200 ft supervision level. Requires 5 min standing rest break and increase to 4L for SpO2 to increase to 93%>. Pt left on 2.5L O2 with SpO2 95% end of session. Pt demonstrates good self-pacing for activity performance with PLB used throughout. Pt has appropriate understanding of energy conservation techniques including DME use as indicated for ADLs. Edu on anxiety reduction strategies (visualization, 5-4-3-2-1 technique, PLB) with pt verbalizing understanding. Pt is at his functional baseline for ADL and mobility, recommend activity as tolerated with mobility + nursing staff. Pt may additionally benefit from pulmonary rehab if recommended by MD. OT will complete orders and sign off, no DME needs at this time.      If plan is discharge home, recommend the following:   A little help with bathing/dressing/bathroom;Assist for transportation     Functional Status Assessment   Patient has not had a recent decline in their functional status     Equipment Recommendations   None recommended by OT      Precautions/Restrictions   Precautions Precautions: Fall Recall of Precautions/Restrictions:  Intact Precaution/Restrictions Comments: monitor  O2 Restrictions Weight Bearing Restrictions Per Provider Order: No     Mobility Bed Mobility Overal bed mobility: Modified Independent                  Transfers Overall transfer level: Modified independent Equipment used: None               General transfer comment: steady during STS and transfers from bed or recliner.      Balance Overall balance assessment: Mild deficits observed, not formally tested                                         ADL either performed or assessed with clinical judgement   ADL Overall ADL's : Modified independent;At baseline                                       General ADL Comments: Pt at baseline for ADL performance.     Vision Baseline Vision/History: 1 Wears glasses Ability to See in Adequate Light: 0 Adequate Patient Visual Report: No change from baseline              Pertinent Vitals/Pain Pain Assessment Pain Assessment: No/denies pain     Extremity/Trunk Assessment Upper Extremity Assessment Upper Extremity Assessment: Overall WFL for tasks assessed   Lower Extremity Assessment Lower Extremity Assessment: Overall WFL for tasks assessed   Cervical / Trunk Assessment Cervical / Trunk  Assessment: Normal   Communication Communication Communication: No apparent difficulties   Cognition Arousal: Alert Behavior During Therapy: WFL for tasks assessed/performed Cognition: No apparent impairments                               Following commands: Intact       Cueing  General Comments   Cueing Techniques: Verbal cues  SpO2 on 2L via [place] upon arrival. Pt dyspneic while talking, O2 increased to 3L with mobility, dropping to 87% on 3L after walking 200 ft supervision level. Requires 5 min standing rest break and increase to 4L for SpO2 to increase to 93%>. Pt left on 2.5L O2 with SpO2 95% end of session.   Exercises      Shoulder Instructions      Home Living Family/patient expects to be discharged to:: Private residence Living Arrangements: Spouse/significant other Available Help at Discharge: Family;Available 24 hours/day Type of Home: House Home Access: Level entry     Home Layout: One level     Bathroom Shower/Tub: Producer, Television/film/video: Standard Bathroom Accessibility: Yes   Home Equipment: Rollator (4 wheels);Shower seat;Grab bars - tub/shower;Cane - single point   Additional Comments: uses 2L O2      Prior Functioning/Environment Prior Level of Function : Independent/Modified Independent;Driving             Mobility Comments: Active, Walks without AD, can ambulate in community, denies falls ADLs Comments: Independent ADL and IADLs    OT Problem List: Decreased activity tolerance;Cardiopulmonary status limiting activity        OT Goals(Current goals can be found in the care plan section)   Acute Rehab OT Goals OT Goal Formulation: All assessment and education complete, DC therapy   AM-PAC OT 6 Clicks Daily Activity     Outcome Measure Help from another person eating meals?: None Help from another person taking care of personal grooming?: None Help from another person toileting, which includes using toliet, bedpan, or urinal?: None Help from another person bathing (including washing, rinsing, drying)?: None Help from another person to put on and taking off regular upper body clothing?: None Help from another person to put on and taking off regular lower body clothing?: None 6 Click Score: 24   End of Session Equipment Utilized During Treatment: Gait belt;Oxygen  Nurse Communication: Mobility status;Other (comment) (O2 sats)  Activity Tolerance: Patient tolerated treatment well;No increased pain Patient left: in chair;with call bell/phone within reach  OT Visit Diagnosis: Other abnormalities of gait and mobility (R26.89)                Time:  9078-9059 OT Time Calculation (min): 19 min Charges:  OT General Charges $OT Visit: 1 Visit OT Evaluation $OT Eval Low Complexity: 1 Low  Asaph Serena L. Geramy Lamorte, OTR/L  11/22/24, 12:06 PM

## 2024-11-23 ENCOUNTER — Other Ambulatory Visit (HOSPITAL_COMMUNITY): Payer: Self-pay

## 2024-11-23 ENCOUNTER — Ambulatory Visit: Admitting: Adult Health

## 2024-11-23 DIAGNOSIS — I480 Paroxysmal atrial fibrillation: Secondary | ICD-10-CM | POA: Diagnosis not present

## 2024-11-23 DIAGNOSIS — I5032 Chronic diastolic (congestive) heart failure: Secondary | ICD-10-CM | POA: Diagnosis not present

## 2024-11-23 DIAGNOSIS — J441 Chronic obstructive pulmonary disease with (acute) exacerbation: Secondary | ICD-10-CM | POA: Diagnosis not present

## 2024-11-23 DIAGNOSIS — E782 Mixed hyperlipidemia: Secondary | ICD-10-CM | POA: Diagnosis not present

## 2024-11-23 LAB — CULTURE, RESPIRATORY W GRAM STAIN: Culture: NORMAL

## 2024-11-23 LAB — RENAL FUNCTION PANEL
Albumin: 4 g/dL (ref 3.5–5.0)
Anion gap: 14 (ref 5–15)
BUN: 23 mg/dL (ref 8–23)
CO2: 31 mmol/L (ref 22–32)
Calcium: 9.8 mg/dL (ref 8.9–10.3)
Chloride: 104 mmol/L (ref 98–111)
Creatinine, Ser: 0.98 mg/dL (ref 0.61–1.24)
GFR, Estimated: >60 mL/min (ref >60)
Glucose, Bld: 109 mg/dL — ABNORMAL HIGH (ref 70–99)
Phosphorus: 3.6 mg/dL (ref 2.5–4.6)
Potassium: 4.8 mmol/L (ref 3.5–5.1)
Sodium: 149 mmol/L — ABNORMAL HIGH (ref 135–145)

## 2024-11-23 LAB — CBC
HCT: 42.5 % (ref 39.0–52.0)
Hemoglobin: 12.1 g/dL — ABNORMAL LOW (ref 13.0–17.0)
MCH: 23.8 pg — ABNORMAL LOW (ref 26.0–34.0)
MCHC: 28.5 g/dL — ABNORMAL LOW (ref 30.0–36.0)
MCV: 83.7 fL (ref 80.0–100.0)
Platelets: 310 K/uL (ref 150–400)
RBC: 5.08 MIL/uL (ref 4.22–5.81)
RDW: 16.8 % — ABNORMAL HIGH (ref 11.5–15.5)
WBC: 12.2 K/uL — ABNORMAL HIGH (ref 4.0–10.5)
nRBC: 0 % (ref 0.0–0.2)

## 2024-11-23 LAB — GLUCOSE, CAPILLARY
Glucose-Capillary: 119 mg/dL — ABNORMAL HIGH (ref 70–99)
Glucose-Capillary: 149 mg/dL — ABNORMAL HIGH (ref 70–99)
Glucose-Capillary: 176 mg/dL — ABNORMAL HIGH (ref 70–99)
Glucose-Capillary: 182 mg/dL — ABNORMAL HIGH (ref 70–99)

## 2024-11-23 LAB — MAGNESIUM: Magnesium: 2.3 mg/dL (ref 1.7–2.4)

## 2024-11-23 NOTE — Progress Notes (Signed)
 Mobility Specialist Progress Note:  [place] 2 L Pre-Mobility: 95% SPO2 During Mobility: 89-91% SPO2 Post-Mobility: 93% SPO2  11/23/24 1553  Mobility  Activity Ambulated independently  Level of Assistance Independent  Assistive Device None  Distance Ambulated (ft) 250 ft  Activity Response Tolerated well  Mobility Referral Yes  Mobility visit 1 Mobility  Mobility Specialist Start Time (ACUTE ONLY) 1536  Mobility Specialist Stop Time (ACUTE ONLY) 1547  Mobility Specialist Time Calculation (min) (ACUTE ONLY) 11 min   Pt was received in recliner and agreed to mobility. 1x pursed lip breathing during 1x brief standing break. Returned to bed with all needs met. Left in room with family.  [employer] - Mobility Specialist

## 2024-11-23 NOTE — Progress Notes (Signed)
 Mobility Specialist - Progress Note   11/23/24 0917  Mobility  Activity Ambulated independently  Level of Assistance Standby assist, set-up cues, supervision of patient - no hands on  Assistive Device None  Distance Ambulated (ft) 250 ft  Range of Motion/Exercises Active  Activity Response Tolerated well  Mobility Referral Yes  Mobility visit 1 Mobility  Mobility Specialist Start Time (ACUTE ONLY) [id number]  Mobility Specialist Stop Time (ACUTE ONLY) 0925  Mobility Specialist Time Calculation (min) (ACUTE ONLY) 8 min   Received in bed and agreed to mobility. On 2L throughout session, pt had no c/o pain. Required two standing rest breaks to combat fatigue and SOB. Desat during 2nd break from 93% to 87%, quickly returned in under 1 min.  Returned to chair with all needs met.  Cyndee Ada Mobility Specialist

## 2024-11-23 NOTE — Progress Notes (Signed)
 PROGRESS NOTE  Paul [name] FMW:[id number] DOB: October 14, 1946   PCP: [name], MD  Patient is from: Home.  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  6 admission in about 2 months.  Pulmonology consulted and following..   Subjective: Seen and examined earlier this morning.  No major events overnight or this morning.  Continues to endorse DOE and desaturation with exertion.  He says his oxygen  dropped to 85% when he was ambulated on 2 L this morning.  Continues to endorse cough.  Assessment and plan: Acute respiratory failure with hypoxia: Desaturated to 87% with ambulation on RA.  Cover to 91% on 2 L. Chronic hypercapnic respiratory failure COPD with acute exacerbation-six admission in 2 months.  Compliant with meds.  RVP and CXR negative. -Continues to endorse cough, SOB and DOE.  Diminished aeration bilaterally. -Continue steroid, doxycycline , scheduled and as needed nebulizers -Follow sputum cultures-reintubated -Appreciate help by pulmonology. -Titrate oxygen  for goal saturation between 89% and 92%. -Incentive spirometry, OOB, ambulatory saturation, PT, OT  Chronic diastolic CHF: TTE on 11/6 with LVEF of 60 to 65% indeterminate DD and normal RVSF.  Appears euvolemic on exam.  CXR without acute finding. -Holding Lasix  and Farxiga  in the setting of hyponatremia -Strict intake and output, daily weight  Paroxysmal atrial fibrillation s/p Watchman device.  Seems to be in sinus rhythm. -Continue home digoxin  -Optimize electrolytes.  DM type 2 with diabetic mixed  hyperlipidemia: A1c 6.8%.  On Farxiga  at home. Recent Labs  Lab 11/22/24 0731 11/22/24 1138 11/22/24 1630 11/22/24 2031 11/23/24 0741  GLUCAP 104* 169* 228* 168* 119*  - Continue SSI-moderate - Continue Farxiga   Essential hypertension: Normotensive. -Continue amlodipine   Hypothyroidism - Continue home Synthroid .  GERD without esophagitis -Continue Protonix  twice daily  Generalized anxiety disorder -Continue sertraline  and as needed Xanax   Hypernatremia: - Hold Lasix  and Farxiga .  OSA on CPAP -CPAP nightly  Body mass index is 25.89 kg/m.           DVT prophylaxis:  enoxaparin  (LOVENOX ) injection 40 mg Start: 11/20/24 2200  Code Status: Full code Family Communication: None at bedside Level of care: Progressive Status is: Inpatient The patient will remain inpatient because: Acute respiratory failure with hypoxia, COPD exacerbation   Final disposition: Home once medically stable.   55 minutes with more than 50% spent in reviewing records, counseling patient/family and coordinating care.  Consultants:  Pulmonology  Procedures: None  Microbiology summarized: COVID-19, influenza and RSV PCR nonreactive 20 pathogen RVP nonreactive Sputum culture pending  Objective: Vitals:   11/23/24 0234 11/23/24 0442 11/23/24 0823 11/23/24 0826  BP:  (!) 148/60    Pulse:  81    Resp:  16    Temp:  97.6 F (36.4 C)    TempSrc:      SpO2: 98% 100% 99% 98%  Weight:      Height:        Examination:  GENERAL: No apparent distress.  Nontoxic. HEENT: MMM.  Vision and hearing grossly intact.  NECK: Supple.  No apparent JVD.  RESP: Expiratory wheeze.  Diminished aeration bilaterally.  CVS:  RRR. Heart sounds normal.  ABD/GI/GU: BS+. Abd soft, NTND.  MSK/EXT:  Moves extremities. No apparent deformity. No edema.  SKIN: no apparent skin lesion or wound NEURO: AA.  Oriented appropriately.  No apparent focal neuro deficit. PSYCH: Calm. Normal affect.   Sch Meds:   Scheduled Meds:  amLODipine   5 mg Oral Daily   arformoterol   15 mcg Nebulization BID   vitamin C   1,000 mg Oral Daily   aspirin  EC  81 mg Oral Daily   atorvastatin   40 mg Oral QHS   budesonide   0.5 mg Nebulization BID   cholecalciferol   2,000 Units Oral Daily   digoxin   125 mcg Oral Daily   doxycycline   100 mg Oral Q12H   enoxaparin  (LOVENOX ) injection  40 mg Subcutaneous QHS   Ensifentrine   1 ampule Nebulization BID   guaiFENesin   1,200 mg Oral Daily   insulin  aspart  0-15 Units Subcutaneous TID AC & HS   ipratropium-albuterol   3 mL Nebulization Q6H   levothyroxine   25 mcg Oral QAC breakfast   melatonin  12 mg Oral QHS   multivitamin with minerals  1 tablet Oral Q breakfast   pantoprazole   40 mg Oral BID   predniSONE   40 mg Oral Q breakfast   revefenacin   175 mcg Nebulization Daily   sertraline   100 mg Oral Daily   sodium chloride  flush  3 mL Intravenous Q12H   Continuous Infusions: PRN Meds:.acetaminophen  **OR** acetaminophen , albuterol , ALPRAZolam , ondansetron  **OR** ondansetron  (ZOFRAN ) IV, mouth rinse, polyethylene glycol  Antimicrobials: Anti-infectives (From admission, onward)    Start     Dose/Rate Route Frequency Ordered Stop   11/20/24 2200  doxycycline  (VIBRA -TABS) tablet 100 mg        100 mg Oral Every 12 hours 11/20/24 1851 11/25/24 2159   11/20/24 1445  ceFEPIme  (MAXIPIME ) 2 g in sodium chloride  0.9 % 100 mL IVPB        2 g 200 mL/hr over 30 Minutes Intravenous  Once 11/20/24 1433 11/20/24 1528        I have personally reviewed the following labs and images: CBC: Recent Labs  Lab 11/20/24 1234 11/20/24 1246 11/21/24 0351 11/23/24 0411  WBC 15.4*  --  10.1 12.2*  NEUTROABS 14.2*  --  9.5*  --   HGB 12.0* 13.3 10.8* 12.1*  HCT 40.1 39.0 36.3* 42.5  MCV 81.0  --  81.4 83.7  PLT 266  --  229 310   BMP &GFR Recent Labs  Lab 11/20/24 1234 11/20/24 1246 11/21/24 0351 11/22/24 0346 11/23/24 0411  NA 142 138 144 145 149*  K 4.2 4.1 5.0 4.5 4.8   CL 96*  --  101 101 104  CO2 33*  --  36* 34* 31  GLUCOSE 270*  --  189* 115* 109*  BUN 18  --  18 24* 23  CREATININE 1.00  --  0.85 1.22 0.98  CALCIUM  10.0  --  9.7 9.2 9.8  MG  --   --  2.9* 2.3 2.3  PHOS  --   --   --  3.2 3.6   Estimated Creatinine Clearance: 53.2 mL/min (by C-G formula based on SCr of 0.98 mg/dL). Liver & Pancreas: Recent Labs  Lab 11/21/24 0351 11/22/24 0346 11/23/24 0411  AST 24  --   --   ALT 52*  --   --   ALKPHOS 72  --   --   BILITOT 0.2  --   --   PROT 6.3*  --   --  ALBUMIN  3.7 3.5 4.0   No results for input(s): LIPASE, AMYLASE in the last 168 hours. No results for input(s): AMMONIA in the last 168 hours. Diabetic: Recent Labs    11/21/24 0351  HGBA1C 7.4*   Recent Labs  Lab 11/22/24 0731 11/22/24 1138 11/22/24 1630 11/22/24 2031 11/23/24 0741  GLUCAP 104* 169* 228* 168* 119*   Cardiac Enzymes: No results for input(s): CKTOTAL, CKMB, CKMBINDEX, TROPONINI in the last 168 hours. Recent Labs    10/14/24 2124 11/05/24 1111 11/20/24 1234  PROBNP 262.0 181.0 242.0   Coagulation Profile: No results for input(s): INR, PROTIME in the last 168 hours. Thyroid  Function Tests: No results for input(s): TSH, T4TOTAL, FREET4, T3FREE, THYROIDAB in the last 72 hours. Lipid Profile: No results for input(s): CHOL, HDL, LDLCALC, TRIG, CHOLHDL, LDLDIRECT in the last 72 hours. Anemia Panel: No results for input(s): VITAMINB12, FOLATE, FERRITIN, TIBC, IRON, RETICCTPCT in the last 72 hours. Urine analysis:    Component Value Date/Time   COLORURINE YELLOW 08/20/2023 1401   APPEARANCEUR CLEAR 08/20/2023 1401   LABSPEC <=1.005 08/20/2023 1401   PHURINE 5.5 08/20/2023 1401   GLUCOSEU >=500 (A) 08/20/2023 1401   HGBUR NEGATIVE 08/20/2023 1401   BILIRUBINUR NEGATIVE 08/20/2023 1401   BILIRUBINUR Negative 05/10/2021 0931   KETONESUR NEGATIVE 08/20/2023 1401   PROTEINUR NEGATIVE 08/20/2023 1401    UROBILINOGEN negative (A) 05/10/2021 0931   NITRITE NEGATIVE 08/20/2023 1401   LEUKOCYTESUR NEGATIVE 08/20/2023 1401   Sepsis Labs: Invalid input(s): PROCALCITONIN, LACTICIDVEN  Microbiology: Recent Results (from the past 240 hours)  Resp panel by RT-PCR (RSV, Flu A&B, Covid) Anterior Nasal Swab     Status: None   Collection Time: 11/20/24 12:34 PM   Specimen: Anterior Nasal Swab  Result Value Ref Range Status   SARS Coronavirus 2 by RT PCR NEGATIVE NEGATIVE Final    Comment: (NOTE) SARS-CoV-2 target nucleic acids are NOT DETECTED.  The SARS-CoV-2 RNA is generally detectable in upper respiratory specimens during the acute phase of infection. The lowest concentration of SARS-CoV-2 viral copies this assay can detect is 138 copies/mL. A negative result does not preclude SARS-Cov-2 infection and should not be used as the sole basis for treatment or other patient management decisions. A negative result may occur with  improper specimen collection/handling, submission of specimen other than nasopharyngeal swab, presence of viral mutation(s) within the areas targeted by this assay, and inadequate number of viral copies(<138 copies/mL). A negative result must be combined with clinical observations, patient history, and epidemiological information. The expected result is Negative.  Fact Sheet for Patients:  bloggercourse.com  Fact Sheet for Healthcare Providers:  seriousbroker.it  This test is no t yet approved or cleared by the [place]  FDA and  has been authorized for detection and/or diagnosis of SARS-CoV-2 by FDA under an Emergency Use Authorization (EUA). This EUA will remain  in effect (meaning this test can be used) for the duration of the COVID-19 declaration under Section 564(b)(1) of the Act, 21 U.S.C.section 360bbb-3(b)(1), unless the authorization is terminated  or revoked sooner.       Influenza A by PCR  NEGATIVE NEGATIVE Final   Influenza B by PCR NEGATIVE NEGATIVE Final    Comment: (NOTE) The Xpert Xpress SARS-CoV-2/FLU/RSV plus assay is intended as an aid in the diagnosis of influenza from Nasopharyngeal swab specimens and should not be used as a sole basis for treatment. Nasal washings and aspirates are unacceptable for Xpert Xpress SARS-CoV-2/FLU/RSV testing.  Fact Sheet for Patients: bloggercourse.com  Fact  Sheet for Healthcare Providers: seriousbroker.it  This test is not yet approved or cleared by the [place]  FDA and has been authorized for detection and/or diagnosis of SARS-CoV-2 by FDA under an Emergency Use Authorization (EUA). This EUA will remain in effect (meaning this test can be used) for the duration of the COVID-19 declaration under Section 564(b)(1) of the Act, 21 U.S.C. section 360bbb-3(b)(1), unless the authorization is terminated or revoked.     Resp Syncytial Virus by PCR NEGATIVE NEGATIVE Final    Comment: (NOTE) Fact Sheet for Patients: bloggercourse.com  Fact Sheet for Healthcare Providers: seriousbroker.it  This test is not yet approved or cleared by the [place]  FDA and has been authorized for detection and/or diagnosis of SARS-CoV-2 by FDA under an Emergency Use Authorization (EUA). This EUA will remain in effect (meaning this test can be used) for the duration of the COVID-19 declaration under Section 564(b)(1) of the Act, 21 U.S.C. section 360bbb-3(b)(1), unless the authorization is terminated or revoked.  Performed at [facility], [address] [place], [place] [address]   Respiratory (~20 pathogens) panel by PCR     Status: None   Collection Time: 11/20/24  7:03 PM   Specimen: Nasopharyngeal Swab; Respiratory  Result Value Ref Range Status   Adenovirus NOT DETECTED NOT DETECTED Final   Coronavirus 229E NOT DETECTED  NOT DETECTED Final    Comment: (NOTE) The Coronavirus on the Respiratory Panel, DOES NOT test for the novel  Coronavirus (2019 nCoV)    Coronavirus HKU1 NOT DETECTED NOT DETECTED Final   Coronavirus NL63 NOT DETECTED NOT DETECTED Final   Coronavirus OC43 NOT DETECTED NOT DETECTED Final   Metapneumovirus NOT DETECTED NOT DETECTED Final   Rhinovirus / Enterovirus NOT DETECTED NOT DETECTED Final   Influenza A NOT DETECTED NOT DETECTED Final   Influenza B NOT DETECTED NOT DETECTED Final   Parainfluenza Virus 1 NOT DETECTED NOT DETECTED Final   Parainfluenza Virus 2 NOT DETECTED NOT DETECTED Final   Parainfluenza Virus 3 NOT DETECTED NOT DETECTED Final   Parainfluenza Virus 4 NOT DETECTED NOT DETECTED Final   Respiratory Syncytial Virus NOT DETECTED NOT DETECTED Final   Bordetella pertussis NOT DETECTED NOT DETECTED Final   Bordetella Parapertussis NOT DETECTED NOT DETECTED Final   Chlamydophila pneumoniae NOT DETECTED NOT DETECTED Final   Mycoplasma pneumoniae NOT DETECTED NOT DETECTED Final    Comment: Performed at [facility], [address], [place], [place] [address]  Expectorated Sputum Assessment w Gram Stain, Rflx to Resp Cult     Status: None   Collection Time: 11/21/24  5:09 AM   Specimen: Expectorated Sputum  Result Value Ref Range Status   Specimen Description EXPECTORATED SPUTUM  Final   Special Requests NONE  Final   Sputum evaluation   Final    THIS SPECIMEN IS ACCEPTABLE FOR SPUTUM CULTURE Performed at [facility], [address] [place], [place] [address]    Report Status 11/21/2024 FINAL  Final  Culture, Respiratory w Gram Stain     Status: None (Preliminary result)   Collection Time: 11/21/24  5:09 AM  Result Value Ref Range Status   Specimen Description   Final    EXPECTORATED SPUTUM Performed at [facility], [address] [place], [place] [address]    Special Requests   Final    NONE Reflexed from  [id number] Performed at [facility], [address] [place], [place] [address]  Gram Stain   Final    MODERATE WBC PRESENT, PREDOMINANTLY PMN FEW GRAM POSITIVE COCCI    Culture   Final    CULTURE REINCUBATED FOR BETTER GROWTH Performed at [facility], [address], [place], [place] [address]    Report Status PENDING  Incomplete    Radiology Studies: No results found.     Vona Whiters T. Jakhiya Brower [facility]  If 7PM-7AM, please contact night-coverage [email or web address] 11/23/2024, 11:28 AM

## 2024-11-23 NOTE — Telephone Encounter (Signed)
 Received [employer]  new start paperwork. Completed form and faxed with clinicals and insurance card copy to [facility] [employer]   Phone#: [phone] Fax#: [phone]

## 2024-11-23 NOTE — Plan of Care (Incomplete)
  Problem: Health Behavior/Discharge Planning: Goal: Ability to manage health-related needs will improve Outcome: Progressing   Problem: Clinical Measurements: Goal: Ability to maintain clinical measurements within normal limits will improve Outcome: Progressing Goal: Diagnostic test results will improve Outcome: Progressing Goal: Respiratory complications will improve Outcome: Progressing   Problem: Activity: Goal: Risk for activity intolerance will decrease Outcome: Progressing   Problem: Coping: Goal: Level of anxiety will decrease Outcome: Progressing   Problem: Education: Goal: Knowledge of General Education information will improve Description: Including pain rating scale, medication(s)/side effects and non-pharmacologic comfort measures Outcome: Adequate for Discharge   Problem: Clinical Measurements: Goal: Will remain free from infection Outcome: Adequate for Discharge Goal: Cardiovascular complication will be avoided Outcome: Adequate for Discharge   Problem: Nutrition: Goal: Adequate nutrition will be maintained Outcome: Adequate for Discharge   Problem: Elimination: Goal: Will not experience complications related to bowel motility Outcome: Adequate for Discharge Goal: Will not experience complications related to urinary retention Outcome: Adequate for Discharge   Problem: Pain Managment: Goal: General experience of comfort will improve and/or be controlled Outcome: Adequate for Discharge   Problem: Safety: Goal: Ability to remain free from injury will improve Outcome: Adequate for Discharge   Problem: Skin Integrity: Goal: Risk for impaired skin integrity will decrease Outcome: Adequate for Discharge   Problem: Education: Goal: Knowledge of disease or condition will improve Outcome: Adequate for Discharge

## 2024-11-23 NOTE — Progress Notes (Signed)
   NAME:  Paul [name], MRN:  [id number], DOB:  September 13, 1946, LOS: 2 ADMISSION DATE:  11/20/2024, CONSULTATION DATE:  11/21/24 REFERRING MD:  Dr Kathrin, CHIEF COMPLAINT:  Shortness of braeth   History of Present Illness:  This is the seventh hospitalization in the last 6 months for this 78 year old man with severe COPD, steroid-dependent, chronic respiratory failure on oxygen  PFTs 01/2024 showed FEV1 of 33%, ratio 49, diffusion capacity 41% He is maintained on triple therapy nebs with budesonide , Brovana  and Yupelri .  Previously was on Breztri .  He has been maintained on prednisone  for the last few months. Recent CT chest 09/2024 showed diffuse emphysema He has not tolerated Daliresp  in the past due to GI side effects.  Previous trial with Nucala  caused 2 hospitalizations.  Azithromycin  thrice weekly was considered but held due to potential interaction with digoxin .  He has not qualified for bilevel due to pCO2 less than 50  and the stable outpatient state.  For severe anxiety he is maintained on alprazolam  and sertraline  On his last office visit 11/24, esfentrine was initiated and he is awaiting delivery He was recently hospitalized 11/14 to 11/17.  He is now back with shortness of breath, cough productive of yellow/green sputum.  This started within hours.  He self treated with neb x 1, Xanax .  In the ED, he was treated with prolonged neb, cefepime  and magnesium  and after hospitalization switch to Solu-Medrol  doxycycline  and bronchodilator therapy  Pertinent  Medical History  HFpEF Atrial fibrillation not on anticoagulation due to severe hematuria and poor balance OSA on CPAP with oxygen  greater than  Significant Hospital Events: Including procedures, antibiotic start and stop dates in addition to other pertinent events   11/30 PCCM consulted  Interim History / Subjective:  Still complaining of shortness of breath Gets exhausted with ambulation Stated he felt lightheaded a little earlier  today, having to make multiple stops  Objective    Blood pressure (!) 148/60, pulse 81, temperature 97.6 F (36.4 C), resp. rate 16, height 5' 5 (1.651 m), weight 70.6 kg, SpO2 98%.        Intake/Output Summary (Last 24 hours) at 11/23/2024 1016 Last data filed at 11/22/2024 2100 Gross per 24 hour  Intake 120 ml  Output --  Net 120 ml   Filed Weights   11/20/24 1227  Weight: 70.6 kg    Examination: General: Elderly, does not appear to be in distress  HEENT: Moist oral mucosa Lungs: Decreased air movement at the bases, no rhonchi  cardiovascular: S1-S2 appreciated Abdomen: Soft, bowel sounds appreciated Extremities: No clubbing, no edema Neuro: Awake alert oriented GU:   I reviewed last 24 h vitals and pain scores, last 48 h intake and output, last 24 h labs and trends, and last 24 h imaging results. Sodium 149  Resolved problem list   Assessment and Plan   COPD exacerbation Anxiety  Anxiety likely playing a role in his exacerbation Seroquel at night, Xanax  as needed during the day  pCO2 level not significantly high at that baseline Continue bronchodilators Continue oxygen  supplementation  Paperwork already in place for Ensifentrine  Will need regular follow-up for optimization of treatment as outpatient  States he does not feel ready to go home yet  For his hypernatremia-encourage free water intake  Jennet Epley, MD [facility] Pager: See Tracey

## 2024-11-24 ENCOUNTER — Encounter: Payer: Self-pay | Admitting: Internal Medicine

## 2024-11-24 ENCOUNTER — Other Ambulatory Visit (HOSPITAL_COMMUNITY): Payer: Self-pay

## 2024-11-24 LAB — RENAL FUNCTION PANEL
Albumin: 3.7 g/dL (ref 3.5–5.0)
Anion gap: 6 (ref 5–15)
BUN: 16 mg/dL (ref 8–23)
CO2: 35 mmol/L — ABNORMAL HIGH (ref 22–32)
Calcium: 9.4 mg/dL (ref 8.9–10.3)
Chloride: 101 mmol/L (ref 98–111)
Creatinine, Ser: 0.75 mg/dL (ref 0.61–1.24)
GFR, Estimated: >60 mL/min (ref >60)
Glucose, Bld: 120 mg/dL — ABNORMAL HIGH (ref 70–99)
Phosphorus: 2.7 mg/dL (ref 2.5–4.6)
Potassium: 4.2 mmol/L (ref 3.5–5.1)
Sodium: 142 mmol/L (ref 135–145)

## 2024-11-24 LAB — CBC
HCT: 38.5 % — ABNORMAL LOW (ref 39.0–52.0)
Hemoglobin: 11.3 g/dL — ABNORMAL LOW (ref 13.0–17.0)
MCH: 24.1 pg — ABNORMAL LOW (ref 26.0–34.0)
MCHC: 29.4 g/dL — ABNORMAL LOW (ref 30.0–36.0)
MCV: 82.3 fL (ref 80.0–100.0)
Platelets: 240 K/uL (ref 150–400)
RBC: 4.68 MIL/uL (ref 4.22–5.81)
RDW: 16.5 % — ABNORMAL HIGH (ref 11.5–15.5)
WBC: 9.3 K/uL (ref 4.0–10.5)
nRBC: 0 % (ref 0.0–0.2)

## 2024-11-24 LAB — GLUCOSE, CAPILLARY
Glucose-Capillary: 94 mg/dL (ref 70–99)
Glucose-Capillary: 120 mg/dL — ABNORMAL HIGH (ref 70–99)

## 2024-11-24 LAB — MAGNESIUM: Magnesium: 2.3 mg/dL (ref 1.7–2.4)

## 2024-11-24 MED ORDER — PREDNISONE 10 MG PO TABS
ORAL_TABLET | ORAL | 0 refills | Status: AC
  Filled 2024-11-24: qty 12, 6d supply, fill #0

## 2024-11-24 NOTE — Progress Notes (Signed)
 Discharge medication delivered to patient at the bedside

## 2024-11-24 NOTE — Progress Notes (Signed)
 AVS given to patient and explained at the bedside. Medications and follow up appointments have been explained with pt verbalizing understanding.

## 2024-11-24 NOTE — Progress Notes (Signed)
 Mobility Specialist - Progress Note  ([place] 2L) Pre-mobility: 94 bpm HR, 96% SpO2 During mobility: 103 bpm HR, 90% SpO2 Post-mobility: 96 bpm HR, 92% SPO2   11/24/24 0931  Mobility  Activity Ambulated independently  Level of Assistance Independent after set-up  Assistive Device None  Distance Ambulated (ft) 250 ft  Range of Motion/Exercises Active  Activity Response Tolerated fair  Mobility visit 1 Mobility  Mobility Specialist Start Time (ACUTE ONLY) [id number]  Mobility Specialist Stop Time (ACUTE ONLY) 0931  Mobility Specialist Time Calculation (min) (ACUTE ONLY) 14 min   Pt was found on recliner chair and agreeable to mobilize. Had x1 brief standing rest break due to SOB. Encouraged pt to ambulate at slower pace and pursed lip breathing throughout session. At EOS returned to recliner chair with all needs met. Call bell in reach.   Paul [name],  Mobility Specialist Can be reached via Secure Chat

## 2024-11-24 NOTE — Telephone Encounter (Signed)
 Received fax that enrollment form was received.   ID [id number]

## 2024-11-24 NOTE — Discharge Summary (Signed)
 Physician Discharge Summary  Paul [name] FMW:[id number] DOB: December 06, 1946 DOA: 11/20/2024  PCP: [name], MD  Admit date: 11/20/2024 Discharge date: 11/24/24  Admitted From: Home. Disposition: Home. Recommendations for Outpatient Follow-up:  Outpatient follow-up with PCP and pulmonology in 1 to 2 weeks. Check CMP and CBC at follow-up Please follow up on the following pending results: None  Home Health: No need identified. Equipment/Devices: Patient has appropriate DME at home  Discharge Condition: Stable CODE STATUS: Full code Diet Orders (From admission, onward)     Start     Ordered   11/20/24 1850  Diet heart healthy/carb modified Room service appropriate? Yes; Fluid consistency: Thin  Diet effective now       Question Answer Comment  Diet-HS Snack? Nothing   Room service appropriate? Yes   Fluid consistency: Thin      11/20/24 1851             Follow-up Information     [name], Kirsten, MD. Schedule an appointment as soon as possible for a visit in 1 week(s).   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  Six admission in about 2 months.  COVID-19 and 20 pathogen RVP nonreactive.   Pulmonology consulted.  Patient was continued on doxycycline , steroid, triple nebulizers with as needed DuoNebs.  Respiratory culture with normal flora.  On the day of discharge, respiratory status improved.  Ambulated on home 2 L of maintain saturation above 90%.  Completed 5 days of doxycycline .  Discharged on prednisone  taper.  He is already on triple nebulizers  with as needed DuoNebs.  Outpatient follow-up with PCP and pulmonology in 1 to 2 weeks.  See individual problem list below for more.   Problems addressed during this hospitalization Acute on chronic respiratory failure with hypoxia: Desaturated to 87% with ambulation on home 2 L Chronic hypercapnic respiratory failure COPD with acute exacerbation-six admission in 79 months.  Compliant with meds.  RVP and CXR negative. -Respiratory symptoms improved.  Maintain saturation above 90% with ambulation on home 2 L. -Completed 5 days of doxycycline  -Discharged on prednisone  taper -Continue home Pulmicort , Brovana , Yupelri  and as needed DuoNebs. - In the process of getting his Ensifentrine    Chronic diastolic CHF: TTE on 11/6 with LVEF of 60 to 65% indeterminate DD and normal RVSF.  Appears euvolemic on exam.  CXR without acute finding. - Continue home Lasix  and Farxiga .   Paroxysmal atrial fibrillation s/p Watchman device.  Seems to be in sinus rhythm. -Continue home digoxin    DM type 2 with diabetic mixed hyperlipidemia: A1c 6.8%.  On Farxiga  at home. -Continue home meds.  Essential hypertension: Normotensive. -Continue amlodipine    Hypothyroidism - Continue home Synthroid .   GERD without esophagitis -Continue Protonix  twice daily   Generalized anxiety disorder -Continue sertraline  and as needed Xanax    Hypernatremia: Resolved.   OSA on CPAP -CPAP nightly  Body mass index is 25.89 kg/m.           Consultations: Pulmonology  Time spent 35  minutes  Vital signs Vitals:  11/23/24 1433 11/23/24 2056 11/24/24 0453 11/24/24 0729  BP: 139/60 (!) 131/59 (!) 140/62   Pulse: 85 82 78   Temp: 97.7 F (36.5 C) 98.3 F (36.8 C) 97.8 F (36.6 C)   Resp: 19 20 17 17   Height:      Weight:      SpO2: 98% 97% 100% 98%  TempSrc:      BMI (Calculated):         Discharge exam  GENERAL: No apparent distress.  Nontoxic. HEENT: MMM.  Vision and hearing grossly intact.   NECK: Supple.  No apparent JVD.  RESP:  No IWOB.  Fair aeration bilaterally. CVS:  RRR. Heart sounds normal.  ABD/GI/GU: BS+. Abd soft, NTND.  MSK/EXT:  Moves extremities. No apparent deformity. No edema.  SKIN: no apparent skin lesion or wound NEURO: Awake and alert. Oriented appropriately.  No apparent focal neuro deficit. PSYCH: Calm. Normal affect.   Discharge Instructions Discharge Instructions     Discharge instructions   Complete by: As directed    It has been a pleasure taking care of you!  You were hospitalized due to COPD exacerbation for which you have been treated with antibiotics, steroid and breathing treatments.  They are discharging you on more steroids to complete treatment course.  Use your nebulizers as prescribed.  Follow-up with your primary care doctor and lung doctor in 1 to 2 weeks or sooner if needed.   Take care,   Increase activity slowly   Complete by: As directed    No wound care   Complete by: As directed       Allergies as of 11/24/2024       Reactions   Tape Other (See Comments)   PLEASE USE COBAN WRAP IN LIEU OF TAPE, as it pulls off the skin!!   Daliresp  [roflumilast ] Diarrhea, Nausea And Vomiting   Levaquin  [levofloxacin ] Palpitations, Other (See Comments)   Made the B/P fluctuate and heart raced   Alendronate Anxiety, Other (See Comments)   Jittery/nervous        Medication List     TAKE these medications    albuterol  108 (90 Base) MCG/ACT inhaler Commonly known as: Ventolin  HFA Inhale 1-2 puffs into the lungs every 6 (six) hours as needed for wheezing or shortness of breath.   ALPRAZolam  0.5 MG tablet Commonly known as: XANAX  Take 1 tablet (0.5 mg total) by mouth 2 (two) times daily as needed for anxiety. F41.1   amLODipine  5 MG tablet Commonly known as: NORVASC  Take 1 tablet (5 mg total) by mouth daily.   arformoterol  15 MCG/2ML Nebu Commonly known as: BROVANA  Take 2 mLs (15 mcg total) by nebulization 2 (two) times  daily.   aspirin  EC 81 MG tablet Take 1 tablet (81 mg total) by mouth daily. Swallow whole.   atorvastatin  40 MG tablet Commonly known as: LIPITOR TAKE 1 TABLET BY MOUTH EVERYDAY AT BEDTIME   budesonide  0.25 MG/2ML nebulizer solution Commonly known as: Pulmicort  Take 2 mLs (0.25 mg total) by nebulization 2 (two) times daily.   CALCIUM  PO Take 1 tablet by mouth daily.   dapagliflozin  propanediol 10 MG Tabs tablet Commonly known as: Farxiga  Take 1 tablet (10 mg total) by mouth daily.   denosumab  60 MG/ML Sosy injection Commonly known as: PROLIA  Inject 60 mg into the skin every 6 (six) months.   digoxin  0.125 MG tablet Commonly known as: LANOXIN  TAKE 1 TABLET BY MOUTH DAILY   ferrous sulfate  325 (65 FE) MG tablet Take  325 mg by mouth 2 (two) times daily with a meal.   Fish Oil 1200 MG Caps Take 1,200 mg by mouth at bedtime.   furosemide  40 MG tablet Commonly known as: LASIX  Take 1 tablet (40 mg total) by mouth daily.   levothyroxine  25 MCG tablet Commonly known as: SYNTHROID  Take 1 tablet (25 mcg total) by mouth daily.   Melatonin 12 MG Tabs Take 12 mg by mouth at bedtime.   Mucinex  Maximum Strength 1200 MG Tb12 Generic drug: Guaifenesin  Take 1,200 mg by mouth daily.   multivitamin with minerals Tabs tablet Take 1 tablet by mouth daily with breakfast.   Ohtuvayre  3 MG/2.5ML Susp Generic drug: Ensifentrine  Take 1 ampule by nebulization 2 (two) times daily.   OXYGEN  Inhale 2-3 L/min into the lungs continuous.   pantoprazole  40 MG tablet Commonly known as: PROTONIX  Take 1 tablet (40 mg total) by mouth 2 (two) times daily.   predniSONE  10 MG tablet Commonly known as: DELTASONE  Take 3 tablets (30 mg total) by mouth daily with breakfast for 2 days, THEN 2 tablets (20 mg total) daily with breakfast for 2 days, THEN 1 tablet (10 mg total) daily with breakfast for 2 days. Start taking on: November 24, 2024   Probiotic Daily Caps Take 1 capsule by mouth in the  morning.   revefenacin  175 MCG/3ML nebulizer solution Commonly known as: YUPELRI  Take 3 mLs (175 mcg total) by nebulization daily.   sertraline  100 MG tablet Commonly known as: ZOLOFT  Take 1 tablet (100 mg total) by mouth daily.   vitamin C  1000 MG tablet Take 1,000 mg by mouth daily.   Vitamin D3 50 MCG (2000 UT) Tabs Take 2,000 Units by mouth in the morning.         Procedures/Studies:   DG Chest Port 1 View Result Date: 11/20/2024 EXAM: 1 VIEW(S) XRAY OF THE CHEST 11/20/2024 12:48:00 PM COMPARISON: 11/05/2024 CLINICAL HISTORY: dib dib FINDINGS: LUNGS AND PLEURA: No focal pulmonary opacity. No pleural effusion. No pneumothorax. HEART AND MEDIASTINUM: No acute abnormality of the cardiac and mediastinal silhouettes. BONES AND SOFT TISSUES: No acute osseous abnormality. IMPRESSION: 1. No acute cardiopulmonary process. Electronically signed by: Lynwood Seip MD 11/20/2024 01:10 PM EST RP Workstation: [id number]   DG Chest 2 View Result Date: 11/05/2024 EXAM: 2 VIEW(S) XRAY OF THE CHEST 11/05/2024 11:24:00 AM COMPARISON: 16 days ago. CLINICAL HISTORY: SOb. FINDINGS: LUNGS AND PLEURA: No focal pulmonary opacity. No pleural effusion. No pneumothorax. HEART AND MEDIASTINUM: No acute abnormality of the cardiac and mediastinal silhouettes. BONES AND SOFT TISSUES: No acute osseous abnormality. IMPRESSION: 1. No acute cardiopulmonary disease. Electronically signed by: Lynwood Seip MD 11/05/2024 11:40 AM EST RP Workstation: [id number]   ECHOCARDIOGRAM COMPLETE Result Date: 10/29/2024    ECHOCARDIOGRAM REPORT   Patient Name:   Paul [name] Date of Exam: 10/29/2024 Medical Rec #:  [id number]      Height:       65.0 in Accession #:    [id number]     Weight:       155.0 lb Date of Birth:  [birth year]       BSA:          1.775 m Patient Age:    78 years       BP:           138/62 mmHg Patient Gender: M              HR:           95  bpm. Exam Location:  [facility] Procedure: 2D Echo, Cardiac Doppler and  Color Doppler (Both Spectral and Color            Flow Doppler were utilized during procedure). Indications:    Chronic diastolic CHF (congestive heart failure) (HCC) [I50.32                 (ICD-10-CM)]  History:        Patient has prior history of Echocardiogram examinations, most                 recent 01/26/2023. CAD, COPD, Arrythmias:Atrial Fibrillation,                 Signs/Symptoms:Dyspnea; Risk Factors:Diabetes, Hypertension and                 Dyslipidemia. Oxygen  dependant. 20mm Watchman Device implanted                 08/02/21.  Sonographer:    Charlie Jointer RDCS Referring Phys: [phone] [facility] S PARRETT  Sonographer Comments: Global longitudinal strain was attempted. IMPRESSIONS  1. TDS, limited study. Left ventricular ejection fraction, by estimation, is 60 to 65%. The left ventricle has normal function. The left ventricle has no regional wall motion abnormalities. There is mild left ventricular hypertrophy. Left ventricular diastolic parameters are indeterminate.  2. Right ventricular systolic function is normal. The right ventricular size is normal.  3. The mitral valve is normal in structure. No evidence of mitral valve regurgitation. No evidence of mitral stenosis.  4. The aortic valve is normal in structure. Aortic valve regurgitation is not visualized. No aortic stenosis is present.  5. The inferior vena cava is normal in size with greater than 50% respiratory variability, suggesting right atrial pressure of 3 mmHg. FINDINGS  Left Ventricle: TDS, limited study. Left ventricular ejection fraction, by estimation, is 60 to 65%. The left ventricle has normal function. The left ventricle has no regional wall motion abnormalities. The left ventricular internal cavity size was normal in size. There is mild left ventricular hypertrophy. Left ventricular diastolic parameters are indeterminate. Right Ventricle: The right ventricular size is normal. No increase in right ventricular wall thickness. Right  ventricular systolic function is normal. Left Atrium: Left atrial size was normal in size. Right Atrium: Right atrial size was normal in size. Pericardium: There is no evidence of pericardial effusion. Mitral Valve: The mitral valve is normal in structure. Mild mitral annular calcification. No evidence of mitral valve regurgitation. No evidence of mitral valve stenosis. Tricuspid Valve: The tricuspid valve is normal in structure. Tricuspid valve regurgitation is not demonstrated. No evidence of tricuspid stenosis. Aortic Valve: The aortic valve is normal in structure. Aortic valve regurgitation is not visualized. No aortic stenosis is present. Pulmonic Valve: The pulmonic valve was normal in structure. Pulmonic valve regurgitation is not visualized. No evidence of pulmonic stenosis. Aorta: The aortic root is normal in size and structure. Venous: The inferior vena cava is normal in size with greater than 50% respiratory variability, suggesting right atrial pressure of 3 mmHg. IAS/Shunts: No atrial level shunt detected by color flow Doppler.  LEFT VENTRICLE PLAX 2D LVIDd:         3.85 cm   Diastology LVIDs:         2.85 cm   LV e' medial:    8.73 cm/s LV PW:         1.05 cm   LV E/e' medial:  9.9 LV IVS:  1.20 cm   LV e' lateral:   8.81 cm/s LVOT diam:     1.80 cm   LV E/e' lateral: 9.9 LV SV:         45 LV SV Index:   25 LVOT Area:     2.54 cm  RIGHT VENTRICLE             IVC RV Basal diam:  3.60 cm     IVC diam: 1.70 cm RV Mid diam:    3.40 cm RV S prime:     11.60 cm/s TAPSE (M-mode): 2.1 cm LEFT ATRIUM             Index        RIGHT ATRIUM           Index LA diam:        2.95 cm 1.66 cm/m   RA Area:     11.00 cm LA Vol (A2C):   18.1 ml 10.20 ml/m  RA Volume:   22.60 ml  12.73 ml/m LA Vol (A4C):   31.6 ml 17.80 ml/m LA Biplane Vol: 23.9 ml 13.46 ml/m  AORTIC VALVE LVOT Vmax:   101.17 cm/s LVOT Vmean:  67.633 cm/s LVOT VTI:    0.176 m  AORTA Ao Root diam: 3.15 cm Ao Asc diam:  2.60 cm Ao Desc diam: 2.10  cm MITRAL VALVE MV Area (PHT): 3.92 cm    SHUNTS MV Decel Time: 194 msec    Systemic VTI:  0.18 m MV E velocity: 86.80 cm/s  Systemic Diam: 1.80 cm MV A velocity: 92.50 cm/s MV E/A ratio:  0.94 Lamar Fitch MD Electronically signed by Lamar Fitch MD Signature Date/Time: 10/29/2024/12:55:31 PM    Final        The results of significant diagnostics from this hospitalization (including imaging, microbiology, ancillary and laboratory) are listed below for reference.     Microbiology: Recent Results (from the past 240 hours)  Resp panel by RT-PCR (RSV, Flu A&B, Covid) Anterior Nasal Swab     Status: None   Collection Time: 11/20/24 12:34 PM   Specimen: Anterior Nasal Swab  Result Value Ref Range Status   SARS Coronavirus 2 by RT PCR NEGATIVE NEGATIVE Final    Comment: (NOTE) SARS-CoV-2 target nucleic acids are NOT DETECTED.  The SARS-CoV-2 RNA is generally detectable in upper respiratory specimens during the acute phase of infection. The lowest concentration of SARS-CoV-2 viral copies this assay can detect is 138 copies/mL. A negative result does not preclude SARS-Cov-2 infection and should not be used as the sole basis for treatment or other patient management decisions. A negative result may occur with  improper specimen collection/handling, submission of specimen other than nasopharyngeal swab, presence of viral mutation(s) within the areas targeted by this assay, and inadequate number of viral copies(<138 copies/mL). A negative result must be combined with clinical observations, patient history, and epidemiological information. The expected result is Negative.  Fact Sheet for Patients:  bloggercourse.com  Fact Sheet for Healthcare Providers:  seriousbroker.it  This test is no t yet approved or cleared by the [place]  FDA and  has been authorized for detection and/or diagnosis of SARS-CoV-2 by FDA under an Emergency  Use Authorization (EUA). This EUA will remain  in effect (meaning this test can be used) for the duration of the COVID-19 declaration under Section 564(b)(1) of the Act, 21 U.S.C.section 360bbb-3(b)(1), unless the authorization is terminated  or revoked sooner.       Influenza A by PCR  NEGATIVE NEGATIVE Final   Influenza B by PCR NEGATIVE NEGATIVE Final    Comment: (NOTE) The Xpert Xpress SARS-CoV-2/FLU/RSV plus assay is intended as an aid in the diagnosis of influenza from Nasopharyngeal swab specimens and should not be used as a sole basis for treatment. Nasal washings and aspirates are unacceptable for Xpert Xpress SARS-CoV-2/FLU/RSV testing.  Fact Sheet for Patients: bloggercourse.com  Fact Sheet for Healthcare Providers: seriousbroker.it  This test is not yet approved or cleared by the [place]  FDA and has been authorized for detection and/or diagnosis of SARS-CoV-2 by FDA under an Emergency Use Authorization (EUA). This EUA will remain in effect (meaning this test can be used) for the duration of the COVID-19 declaration under Section 564(b)(1) of the Act, 21 U.S.C. section 360bbb-3(b)(1), unless the authorization is terminated or revoked.     Resp Syncytial Virus by PCR NEGATIVE NEGATIVE Final    Comment: (NOTE) Fact Sheet for Patients: bloggercourse.com  Fact Sheet for Healthcare Providers: seriousbroker.it  This test is not yet approved or cleared by the [place]  FDA and has been authorized for detection and/or diagnosis of SARS-CoV-2 by FDA under an Emergency Use Authorization (EUA). This EUA will remain in effect (meaning this test can be used) for the duration of the COVID-19 declaration under Section 564(b)(1) of the Act, 21 U.S.C. section 360bbb-3(b)(1), unless the authorization is terminated or revoked.  Performed at [facility], [address] [place], [place] [address]   Respiratory (~20 pathogens) panel by PCR     Status: None   Collection Time: 11/20/24  7:03 PM   Specimen: Nasopharyngeal Swab; Respiratory  Result Value Ref Range Status   Adenovirus NOT DETECTED NOT DETECTED Final   Coronavirus 229E NOT DETECTED NOT DETECTED Final    Comment: (NOTE) The Coronavirus on the Respiratory Panel, DOES NOT test for the novel  Coronavirus (2019 nCoV)    Coronavirus HKU1 NOT DETECTED NOT DETECTED Final   Coronavirus NL63 NOT DETECTED NOT DETECTED Final   Coronavirus OC43 NOT DETECTED NOT DETECTED Final   Metapneumovirus NOT DETECTED NOT DETECTED Final   Rhinovirus / Enterovirus NOT DETECTED NOT DETECTED Final   Influenza A NOT DETECTED NOT DETECTED Final   Influenza B NOT DETECTED NOT DETECTED Final   Parainfluenza Virus 1 NOT DETECTED NOT DETECTED Final   Parainfluenza Virus 2 NOT DETECTED NOT DETECTED Final   Parainfluenza Virus 3 NOT DETECTED NOT DETECTED Final   Parainfluenza Virus 4 NOT DETECTED NOT DETECTED Final   Respiratory Syncytial Virus NOT DETECTED NOT DETECTED Final   Bordetella pertussis NOT DETECTED NOT DETECTED Final   Bordetella Parapertussis NOT DETECTED NOT DETECTED Final   Chlamydophila pneumoniae NOT DETECTED NOT DETECTED Final   Mycoplasma pneumoniae NOT DETECTED NOT DETECTED Final    Comment: Performed at [facility], [address] [place], [place] [address]  Expectorated Sputum Assessment w Gram Stain, Rflx to Resp Cult     Status: None   Collection Time: 11/21/24  5:09 AM   Specimen: Expectorated Sputum  Result Value Ref Range Status   Specimen Description EXPECTORATED SPUTUM  Final   Special Requests NONE  Final   Sputum evaluation   Final    THIS SPECIMEN IS ACCEPTABLE FOR SPUTUM CULTURE Performed at [facility], [address], [place], [place] [address]    Report Status 11/21/2024 FINAL  Final  Culture, Respiratory w Gram Stain     Status: None    Collection Time: 11/21/24  5:09 AM  Result Value Ref Range Status   Specimen Description   Final    EXPECTORATED SPUTUM Performed at [facility], [address] [place], [place] [address]    Special Requests   Final    NONE Reflexed from [id number] Performed at [facility], [address], [place], [place] [address]    Gram Stain   Final    MODERATE WBC PRESENT, PREDOMINANTLY PMN FEW GRAM POSITIVE COCCI    Culture   Final    RARE Normal respiratory flora-no Staph aureus or Pseudomonas seen Performed at [facility], [address], [place], [place] [address]    Report Status 11/23/2024 FINAL  Final     Labs:  CBC: Recent Labs  Lab 11/20/24 1234 11/20/24 1246 11/21/24 0351 11/23/24 0411 11/24/24 0820  WBC 15.4*  --  10.1 12.2* 9.3  NEUTROABS 14.2*  --  9.5*  --   --   HGB 12.0* 13.3 10.8* 12.1* 11.3*  HCT 40.1 39.0 36.3* 42.5 38.5*  MCV 81.0  --  81.4 83.7 82.3  PLT 266  --  229 310 240   BMP &GFR Recent Labs  Lab 11/20/24 1234 11/20/24 1246 11/21/24 0351 11/22/24 0346 11/23/24 0411 11/24/24 0820  NA 142 138 144 145 149* 142  K 4.2 4.1 5.0 4.5 4.8 4.2  CL 96*  --  101 101 104 101  CO2 33*  --  36* 34* 31 35*  GLUCOSE 270*  --  189* 115* 109* 120*  BUN 18  --  18 24* 23 16  CREATININE 1.00  --  0.85 1.22 0.98 0.75  CALCIUM  10.0  --  9.7 9.2 9.8 9.4  MG  --   --  2.9* 2.3 2.3 2.3  PHOS  --   --   --  3.2 3.6 2.7   Estimated Creatinine Clearance: 65.1 mL/min (by C-G formula based on SCr of 0.75 mg/dL). Liver & Pancreas: Recent Labs  Lab 11/21/24 0351 11/22/24 0346 11/23/24 0411 11/24/24 0820  AST 24  --   --   --   ALT 52*  --   --   --   ALKPHOS 72  --   --   --   BILITOT 0.2  --   --   --   PROT 6.3*  --   --   --   ALBUMIN  3.7 3.5 4.0 3.7   No results for input(s): LIPASE, AMYLASE in the last 168 hours. No results for input(s): AMMONIA in the last 168 hours. Diabetic: No results for input(s):  HGBA1C in the last 72 hours. Recent Labs  Lab 11/23/24 0741 11/23/24 1152 11/23/24 1626 11/23/24 2057 11/24/24 0717  GLUCAP 119* 149* 176* 182* 94   Cardiac Enzymes: No results for input(s): CKTOTAL, CKMB, CKMBINDEX, TROPONINI in the last 168 hours. Recent Labs    10/14/24 2124 11/05/24 1111 11/20/24 1234  PROBNP 262.0 181.0 242.0   Coagulation Profile: No results for input(s): INR, PROTIME in the last 168 hours. Thyroid  Function Tests: No results for input(s): TSH, T4TOTAL, FREET4, T3FREE, THYROIDAB in the last 72 hours. Lipid Profile: No results for input(s): CHOL, HDL, LDLCALC, TRIG, CHOLHDL, LDLDIRECT in the last 72 hours. Anemia Panel: No results for input(s): VITAMINB12, FOLATE, FERRITIN, TIBC, IRON, RETICCTPCT in the last 72 hours. Urine analysis:    Component Value Date/Time   COLORURINE YELLOW 08/20/2023 1401   APPEARANCEUR CLEAR 08/20/2023 1401   LABSPEC <=1.005 08/20/2023 1401   PHURINE  5.5 08/20/2023 1401   GLUCOSEU >=500 (A) 08/20/2023 1401   HGBUR NEGATIVE 08/20/2023 1401   BILIRUBINUR NEGATIVE 08/20/2023 1401   BILIRUBINUR Negative 05/10/2021 0931   KETONESUR NEGATIVE 08/20/2023 1401   PROTEINUR NEGATIVE 08/20/2023 1401   UROBILINOGEN negative (A) 05/10/2021 0931   NITRITE NEGATIVE 08/20/2023 1401   LEUKOCYTESUR NEGATIVE 08/20/2023 1401   Sepsis Labs: Invalid input(s): PROCALCITONIN, LACTICIDVEN   SIGNED:  Annamae Shivley T Bellina Tokarczyk, MD  [facility] 11/24/2024, 10:39 AM

## 2024-11-24 NOTE — Care Management Important Message (Signed)
 Important Message  Patient Details IM Letter given. Name: Paul [name] MRN: [id number] Date of Birth: 03-14-1946   Important Message Given:  Yes - Medicare IM     Melba Ates 11/24/2024, 12:02 PM

## 2024-11-24 NOTE — Progress Notes (Signed)
   11/24/24 0025  BiPAP/CPAP/SIPAP  BiPAP/CPAP/SIPAP Pt Type Adult  Reason BIPAP/CPAP not in use Other(comment) (refused for the night)

## 2024-11-24 NOTE — Progress Notes (Signed)
   NAME:  Paul [name], MRN:  [id number], DOB:  1946/06/06, LOS: 3 ADMISSION DATE:  11/20/2024, CONSULTATION DATE:  11/21/24 REFERRING MD:  Dr Kathrin, CHIEF COMPLAINT:  Shortness of braeth   History of Present Illness:  This is the seventh hospitalization in the last 6 months for this 78 year old man with severe COPD, steroid-dependent, chronic respiratory failure on oxygen  PFTs 01/2024 showed FEV1 of 33%, ratio 49, diffusion capacity 41% He is maintained on triple therapy nebs with budesonide , Brovana  and Yupelri .  Previously was on Breztri .  He has been maintained on prednisone  for the last few months. Recent CT chest 09/2024 showed diffuse emphysema He has not tolerated Daliresp  in the past due to GI side effects.  Previous trial with Nucala  caused 2 hospitalizations.  Azithromycin  thrice weekly was considered but held due to potential interaction with digoxin .  He has not qualified for bilevel due to pCO2 less than 50  and the stable outpatient state.  For severe anxiety he is maintained on alprazolam  and sertraline  On his last office visit 11/24, esfentrine was initiated and he is awaiting delivery He was recently hospitalized 11/14 to 11/17.  He is now back with shortness of breath, cough productive of yellow/green sputum.  This started within hours.  He self treated with neb x 1, Xanax .  In the ED, he was treated with prolonged neb, cefepime  and magnesium  and after hospitalization switch to Solu-Medrol  doxycycline  and bronchodilator therapy  Pertinent  Medical History  HFpEF Atrial fibrillation not on anticoagulation due to severe hematuria and poor balance OSA on CPAP with oxygen  greater than  Significant Hospital Events: Including procedures, antibiotic start and stop dates in addition to other pertinent events   11/30 PCCM consulted  Interim History / Subjective:  Shortness of breath is better No overnight events Objective    Blood pressure (!) 140/62, pulse 78, temperature 97.8 F  (36.6 C), resp. rate 17, height 5' 5 (1.651 m), weight 70.6 kg, SpO2 98%.    FiO2 (%):  [28 %] 28 %   Intake/Output Summary (Last 24 hours) at 11/24/2024 1011 Last data filed at 11/24/2024 0550 Gross per 24 hour  Intake 240 ml  Output --  Net 240 ml   Filed Weights   11/20/24 1227  Weight: 70.6 kg    Examination: General: Elderly, does not appear to be in distress HEENT: Moist oral mucosa Lungs: Decreased air movement at the bases bilaterally, no added sounds cardiovascular: S1-S2 appreciated Abdomen: Soft, bowel sounds appreciated Extremities: No clubbing, no edema Neuro: Awake alert oriented GU:   I reviewed last 24 h vitals and pain scores, last 48 h intake and output, last 24 h labs and trends, and last 24 h imaging results. Sodium down to 142 Resolved problem list   Assessment and Plan   COPD exacerbation  Anxiety  COPD is advanced at baseline with recent exacerbation likely contributing to increased O2 need  Can be considered for discharge today  Hypernatremia did resolve  Continue oxygen  supplementation Regular exercise  Follow-up as previously discussed with patient  PCCM will sign off

## 2024-11-24 NOTE — Plan of Care (Signed)
  Problem: Nutrition: Goal: Adequate nutrition will be maintained Outcome: Adequate for Discharge   Problem: Pain Managment: Goal: General experience of comfort will improve and/or be controlled Outcome: Adequate for Discharge

## 2024-11-25 ENCOUNTER — Telehealth: Payer: Self-pay

## 2024-11-25 NOTE — Transitions of Care (Post Inpatient/ED Visit) (Signed)
   11/25/2024  Name: Paul [name] MRN: [id number] DOB: 1946-11-28  Today's TOC FU Call Status: Today's TOC FU Call Status:: Successful TOC FU Call Completed (Spoke with patient who states he understands everything and declined TOC call program and requested to be put on the Do Not Call for TOC) TOC FU Call Complete Date: 11/25/24  Patient's Name and Date of Birth confirmed. DOB, Name  Transition Care Management Follow-up Telephone Call Date of Discharge: 11/24/24 Discharge Facility: [facility])  Shona Prow RN, CCM [place]  VBCI-Population Health RN Care Manager [phone]

## 2024-11-26 ENCOUNTER — Telehealth: Payer: Self-pay

## 2024-11-26 NOTE — Telephone Encounter (Signed)
 Order has been faxed to [facility]. NFN

## 2024-11-26 NOTE — Telephone Encounter (Signed)
 Copied from CRM #[id number]. Topic: Clinical - Request for Lab/Test Order >> Nov 24, 2024  2:36 PM Devaughn RAMAN wrote: Reason for CRM: Pt's wife Illa called to advise she spoke with [facility] and they advised they did not get an order for pt's pulmonary rehab and provided a fax for f/u. Illa stated they advised once order is faxed and received they can schedule pt for orientation on next week.  [facility]   Fax-[phone]    Tried to reach out to patient VM/ LM return call  Referral was sent out on 11/16/24   Will reach out to [facility] to advise

## 2024-11-29 ENCOUNTER — Ambulatory Visit: Admitting: Adult Health

## 2024-12-02 ENCOUNTER — Ambulatory Visit: Admitting: Adult Health

## 2024-12-02 ENCOUNTER — Encounter: Payer: Self-pay | Admitting: Adult Health

## 2024-12-02 VITALS — BP 149/55 | HR 89 | Temp 98.3°F | Ht 65.0 in | Wt 152.4 lb

## 2024-12-02 DIAGNOSIS — R1310 Dysphagia, unspecified: Secondary | ICD-10-CM

## 2024-12-02 DIAGNOSIS — J9611 Chronic respiratory failure with hypoxia: Secondary | ICD-10-CM | POA: Diagnosis not present

## 2024-12-02 DIAGNOSIS — J9612 Chronic respiratory failure with hypercapnia: Secondary | ICD-10-CM | POA: Diagnosis not present

## 2024-12-02 DIAGNOSIS — I5032 Chronic diastolic (congestive) heart failure: Secondary | ICD-10-CM | POA: Diagnosis not present

## 2024-12-02 DIAGNOSIS — G4733 Obstructive sleep apnea (adult) (pediatric): Secondary | ICD-10-CM | POA: Diagnosis not present

## 2024-12-02 DIAGNOSIS — R5381 Other malaise: Secondary | ICD-10-CM

## 2024-12-02 DIAGNOSIS — J449 Chronic obstructive pulmonary disease, unspecified: Secondary | ICD-10-CM | POA: Diagnosis not present

## 2024-12-02 DIAGNOSIS — F419 Anxiety disorder, unspecified: Secondary | ICD-10-CM | POA: Diagnosis not present

## 2024-12-02 DIAGNOSIS — Z87891 Personal history of nicotine dependence: Secondary | ICD-10-CM | POA: Diagnosis not present

## 2024-12-02 DIAGNOSIS — F411 Generalized anxiety disorder: Secondary | ICD-10-CM

## 2024-12-02 MED ORDER — FUROSEMIDE 40 MG PO TABS: 40.0000 mg | ORAL_TABLET | Freq: Every day | ORAL | 1 refills | Status: DC

## 2024-12-02 MED ORDER — ALPRAZOLAM 0.5 MG PO TABS: 0.5000 mg | ORAL_TABLET | Freq: Two times a day (BID) | ORAL | 0 refills | Status: AC | PRN

## 2024-12-02 NOTE — Progress Notes (Unsigned)
 @Patient  ID: Paul [name], male    DOB: 30-May-1946, 78 y.o.   MRN: [id number]  No chief complaint on file.   Referring provider: [name], MD  HPI: 78 year old male former smoker quit in 2010 followed for COPD with emphysema, steroid-dependent on prednisone  baseline prednisone  10 mg daily), chronic respiratory failure on oxygen , sleep apnea on CPAP with oxygen  Prior to recurrent COPD exacerbations and hospitalizations Medical history significant for diastolic heart failure, A-fib not on anticoagulation therapy due to severe bleeding secondary to hematuria and balance issues     TEST/EVENTS : Reviewed 12/02/2024  Intolerant to Daliresp -GI side effects COVID-19 infection January 2022 requiring hospitalization Hospitalization February 2023 for rhinovirus with COPD exacerbation Hospitalization March 2023 and June 2023 for COPD exacerbation Hospitalization March 2024 COPD exacerbation March 2023 outpatient ABG without hypercarbia unable to qualify for NIV or BiPAP PFTs 01/2024 show stable lung function compared to 2017 with FEV1 at 33%, ratio 49, FVC 48%, diffusing capacity 41%. Some decline in DLCO  CT chest October 14, 2024 pulmonary emphysema no nodules noted, no acute process ER /Hospitalization -08/26/24, 09/06/24, 09/27/24, 10/14/24 , 10/20/24, 11/05/24 , 11/20/24  Nucala  rx 07/2024 -stopped 10/2024  Ohtuvayre  started 10/2024  Echo 10/29/24 EF 60-65%, RV size nml    Discussed the use of AI scribe software for clinical note transcription with the patient, who gave verbal consent to proceed.  History of Present Illness Paul [name] is a 78 year old male with chronic obstructive pulmonary disease (COPD) who presents for follow-up after multiple recent hospitalizations.  He has experienced frequent exacerbations of his COPD, resulting in seven hospital admissions over the past three months. Currently, he feels better with improved breathing after using his inhaler. His medication  regimen includes a tapering dose of prednisone , currently at 22.5 mg, with plans to stabilize at 20 mg. He uses nebulizers with budesonide , Brovana , Yupelri , and has started Otuvair twice daily.  Anxiety has been a significant issue, exacerbated by high-dose prednisone  use. He takes sertraline  nightly and Xanax  as needed, though he uses it sparingly. His caregiver notes that minor issues can trigger anxiety, leading to breathing difficulties.  He is on oxygen  therapy, typically at two liters, but his oxygen  saturation can drop to 88% even with oxygen  use. He uses a CPAP machine at night and during naps, which helps slow his breathing during flare-ups.  He has a history of using Nucala , a biologic, which he believes may have contributed to recent exacerbations, and has since discontinued it.  He is scheduled to start pulmonary rehabilitation at [facility] to regain strength. He has been using exercise bands and a respiratory exercise device at home to maintain physical activity.  He is on furosemide  40 mg daily, which was initiated during a hospital stay. His caregiver manages his medication refills and administration.  No color in his sputum when coughing. He experiences anxiety, particularly when his prednisone  dose is reduced.     Allergies[1]  Immunization History  Administered Date(s) Administered   Fluad Quad(high Dose 65+) 10/02/2020, 10/09/2021, 09/05/2022, 10/17/2022   Fluad Trivalent(High Dose 65+) 10/30/2023   INFLUENZA, HIGH DOSE SEASONAL PF 09/02/2016, 09/10/2017, 08/31/2018, 09/24/2018, 10/21/2019, 09/16/2024   Influenza Split 09/22/2013, 09/01/2015   Influenza Whole 09/04/2011, 09/21/2012   Influenza-Unspecified 08/23/2014   Moderna Covid-19 Vaccine Bivalent Booster 64yrs & up 10/09/2021   Moderna SARS-COV2 Booster Vaccination 07/05/2021   Moderna Sars-Covid-2 Vaccination 01/17/2020, 02/14/2020, 10/06/2020   PNEUMOCOCCAL CONJUGATE-20 12/05/2022    Pfizer(Comirnaty)Fall Seasonal Vaccine 12 years  and older 10/31/2022, 10/30/2023   Pneumococcal Conjugate-13 01/11/2014, 12/09/2016   Pneumococcal Polysaccharide-23 08/23/2010, 08/29/2011   Respiratory Syncytial Virus Vaccine,Recomb Aduvanted(Arexvy) 09/23/2022   Tdap 02/01/2019   Zoster Recombinant(Shingrix) 04/04/2014   Zoster, Live 04/05/2014    Past Medical History:  Diagnosis Date   Anemia    Aortic atherosclerosis    Aspiration pneumonia due to inhalation of vomitus (HCC) 02/03/2022   Atrial fibrillation (HCC)    Back pain    COPD (chronic obstructive pulmonary disease) (HCC)    COPD exacerbation (HCC) 12/27/2021   COVID-19 12/24/2020   Diabetes mellitus without complication (HCC)    Dizziness 04/08/2022   Elevated PSA    GAD (generalized anxiety disorder)    GERD (gastroesophageal reflux disease)    High cholesterol    Hypertension    Lingular pneumonia 01/05/2021   See cxr 01/06/20 rx with zpak/ cefipime x 5 days and then developed purulent sputum again 01/12/21 so rx levcaquin 500 mg daily x 7 days then return for cxr before more    Lung nodule < 6cm on CT 05/29/2016   Osteoporosis    Oxygen  deficiency    Pneumonia    January 2022   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 Cindie with a 20 mm Watchman FLX device   RSV bronchitis 01/27/2022   Vertebral compression fracture (HCC) 05/29/2019   T8 compression fracture noted on CT scan 05/28/2019 inpatient with chronic steroid dependent COPD - rx calcitonin nasal spray rx per PCP     Tobacco History: Tobacco Use History[2] Counseling given: Not Answered Tobacco comments: Counseled to remain smoke free   Outpatient Medications Prior to Visit  Medication Sig Dispense Refill   albuterol  (VENTOLIN  HFA) 108 (90 Base) MCG/ACT inhaler Inhale 1-2 puffs into the lungs every 6 (six) hours as needed for wheezing or shortness of breath. 18 g 2   ALPRAZolam  (XANAX ) 0.5 MG tablet Take  1 tablet (0.5 mg total) by mouth 2 (two) times daily as needed for anxiety. F41.1 30 tablet 0   amLODipine  (NORVASC ) 5 MG tablet Take 1 tablet (5 mg total) by mouth daily. 30 tablet 1   arformoterol  (BROVANA ) 15 MCG/2ML NEBU Take 2 mLs (15 mcg total) by nebulization 2 (two) times daily. 120 mL 11   Ascorbic Acid  (VITAMIN C ) 1000 MG tablet Take 1,000 mg by mouth daily.     aspirin  EC 81 MG tablet Take 1 tablet (81 mg total) by mouth daily. Swallow whole. 90 tablet 3   atorvastatin  (LIPITOR) 40 MG tablet TAKE 1 TABLET BY MOUTH EVERYDAY AT BEDTIME 90 tablet 1   budesonide  (PULMICORT ) 0.25 MG/2ML nebulizer solution Take 2 mLs (0.25 mg total) by nebulization 2 (two) times daily. 60 mL 12   CALCIUM  PO Take 1 tablet by mouth daily.     Cholecalciferol  (VITAMIN D3) 50 MCG (2000 UT) TABS Take 2,000 Units by mouth in the morning.     dapagliflozin  propanediol (FARXIGA ) 10 MG TABS tablet Take 1 tablet (10 mg total) by mouth daily. 90 tablet 4   denosumab  (PROLIA ) 60 MG/ML SOSY injection Inject 60 mg into the skin every 6 (six) months. 180 mL 0   digoxin  (LANOXIN ) 0.125 MG tablet TAKE 1 TABLET BY MOUTH DAILY 90 tablet 2   Ensifentrine  (OHTUVAYRE ) 3 MG/2.5ML SUSP Take 1 ampule by nebulization 2 (two) times daily.     ferrous sulfate  325 (65 FE) MG tablet Take 325 mg by mouth 2 (two) times  daily with a meal.     furosemide  (LASIX ) 40 MG tablet Take 1 tablet (40 mg total) by mouth daily. 30 tablet 1   Guaifenesin  (MUCINEX  MAXIMUM STRENGTH) 1200 MG TB12 Take 1,200 mg by mouth daily.     levothyroxine  (SYNTHROID ) 25 MCG tablet Take 1 tablet (25 mcg total) by mouth daily. 90 tablet 3   Melatonin 12 MG TABS Take 12 mg by mouth at bedtime.     Multiple Vitamin (MULTIVITAMIN WITH MINERALS) TABS tablet Take 1 tablet by mouth daily with breakfast.     Omega-3 Fatty Acids  (FISH OIL) 1200 MG CAPS Take 1,200 mg by mouth at bedtime.     OXYGEN  Inhale 2-3 L/min into the lungs continuous.     pantoprazole  (PROTONIX ) 40 MG  tablet Take 1 tablet (40 mg total) by mouth 2 (two) times daily. 180 tablet 3   Probiotic Product (PROBIOTIC DAILY) CAPS Take 1 capsule by mouth in the morning.     revefenacin  (YUPELRI ) 175 MCG/3ML nebulizer solution Take 3 mLs (175 mcg total) by nebulization daily. 90 mL 11   sertraline  (ZOLOFT ) 100 MG tablet Take 1 tablet (100 mg total) by mouth daily. 90 tablet 0   No facility-administered medications prior to visit.     Review of Systems:   Constitutional:   No  weight loss, night sweats,  Fevers, chills, fatigue, or  lassitude.  HEENT:   No headaches,  Difficulty swallowing,  Tooth/dental problems, or  Sore throat,                No sneezing, itching, ear ache, nasal congestion, post nasal drip,   CV:  No chest pain,  Orthopnea, PND, swelling in lower extremities, anasarca, dizziness, palpitations, syncope.   GI  No heartburn, indigestion, abdominal pain, nausea, vomiting, diarrhea, change in bowel habits, loss of appetite, bloody stools.   Resp: No shortness of breath with exertion or at rest.  No excess mucus, no productive cough,  No non-productive cough,  No coughing up of blood.  No change in color of mucus.  No wheezing.  No chest wall deformity  Skin: no rash or lesions.  GU: no dysuria, change in color of urine, no urgency or frequency.  No flank pain, no hematuria   MS:  No joint pain or swelling.  No decreased range of motion.  No back pain.    Physical Exam  There were no vitals taken for this visit.  GEN: A/Ox3; pleasant , NAD, well nourished    HEENT:  [place]/AT,  EACs-clear, TMs-wnl, NOSE-clear, THROAT-clear, no lesions, no postnasal drip or exudate noted.   NECK:  Supple w/ fair ROM; no JVD; normal carotid impulses w/o bruits; no thyromegaly or nodules palpated; no lymphadenopathy.    RESP  Clear  P & A; w/o, wheezes/ rales/ or rhonchi. no accessory muscle use, no dullness to percussion  CARD:  RRR, no m/r/g, no peripheral edema, pulses intact, no cyanosis  or clubbing.  GI:   Soft & nt; nml bowel sounds; no organomegaly or masses detected.   Musco: Warm bil, no deformities or joint swelling noted.   Neuro: alert, no focal deficits noted.    Skin: Warm, no lesions or rashes    Lab Results:Reviewed 12/02/2024   CBC    Component Value Date/Time   WBC 9.3 11/24/2024 0820   RBC 4.68 11/24/2024 0820   HGB 11.3 (L) 11/24/2024 0820   HGB 12.9 (L) 10/28/2024 1507   HCT 38.5 (L) 11/24/2024 0820  HCT 42.4 10/28/2024 1507   PLT 240 11/24/2024 0820   PLT 290 10/28/2024 1507   MCV 82.3 11/24/2024 0820   MCV 80 10/28/2024 1507   MCH 24.1 (L) 11/24/2024 0820   MCHC 29.4 (L) 11/24/2024 0820   RDW 16.5 (H) 11/24/2024 0820   RDW 16.6 (H) 10/28/2024 1507   LYMPHSABS 0.4 (L) 11/21/2024 0351   LYMPHSABS 0.4 (L) 10/28/2024 1507   MONOABS 0.2 11/21/2024 0351   EOSABS 0.0 11/21/2024 0351   EOSABS 0.0 10/28/2024 1507   BASOSABS 0.0 11/21/2024 0351   BASOSABS 0.0 10/28/2024 1507    BMET    Component Value Date/Time   NA 142 11/24/2024 0820   NA 142 10/28/2024 1507   K 4.2 11/24/2024 0820   CL 101 11/24/2024 0820   CO2 35 (H) 11/24/2024 0820   GLUCOSE 120 (H) 11/24/2024 0820   BUN 16 11/24/2024 0820   BUN 21 10/28/2024 1507   CREATININE 0.75 11/24/2024 0820   CALCIUM  9.4 11/24/2024 0820   GFRNONAA >60 11/24/2024 0820   GFRAA 107 11/20/2020 1202    BNP    Component Value Date/Time   BNP 100.8 (H) 05/12/2023 1626    ProBNP    Component Value Date/Time   PROBNP 242.0 11/20/2024 1234   PROBNP 194 10/04/2024 0919    Imaging: DG Chest Port 1 View Result Date: 11/20/2024 EXAM: 1 VIEW(S) XRAY OF THE CHEST 11/20/2024 12:48:00 PM COMPARISON: 11/05/2024 CLINICAL HISTORY: dib dib FINDINGS: LUNGS AND PLEURA: No focal pulmonary opacity. No pleural effusion. No pneumothorax. HEART AND MEDIASTINUM: No acute abnormality of the cardiac and mediastinal silhouettes. BONES AND SOFT TISSUES: No acute osseous abnormality. IMPRESSION: 1. No acute  cardiopulmonary process. Electronically signed by: Lynwood Seip MD 11/20/2024 01:10 PM EST RP Workstation: [id number]   DG Chest 2 View Result Date: 11/05/2024 EXAM: 2 VIEW(S) XRAY OF THE CHEST 11/05/2024 11:24:00 AM COMPARISON: 16 days ago. CLINICAL HISTORY: SOb. FINDINGS: LUNGS AND PLEURA: No focal pulmonary opacity. No pleural effusion. No pneumothorax. HEART AND MEDIASTINUM: No acute abnormality of the cardiac and mediastinal silhouettes. BONES AND SOFT TISSUES: No acute osseous abnormality. IMPRESSION: 1. No acute cardiopulmonary disease. Electronically signed by: Lynwood Seip MD 11/05/2024 11:40 AM EST RP Workstation: [id number]    denosumab  (PROLIA ) injection 60 mg     Date Action Dose Route User   Discharged on 11/24/2024   Admitted on 11/20/2024   Discharged on 11/08/2024   Admitted on 11/05/2024   11/04/2024 1424 Given 60 mg Subcutaneous (Right Arm) Twilla Dodrill, CMA          Latest Ref Rng & Units 02/16/2024    8:56 AM 05/29/2016    9:15 AM  PFT Results  FVC-Pre L 1.64  1.92   FVC-Predicted Pre % 48  52   FVC-Post L 1.54    FVC-Predicted Post % 45    Pre FEV1/FVC % % 49  41   Post FEV1/FCV % % 48    FEV1-Pre L 0.80  0.78   FEV1-Predicted Pre % 33  29   FEV1-Post L 0.74    DLCO uncorrected ml/min/mmHg 8.82    DLCO UNC% % 41    DLCO corrected ml/min/mmHg 8.82    DLCO COR %Predicted % 41    DLVA Predicted % 62    TLC L 6.68    TLC % Predicted % 108    RV % Predicted % 181      No results found for: NITRICOXIDE      No data  to display              Assessment & Plan:   Assessment and Plan Assessment & Plan Chronic obstructive pulmonary disease with recurrent exacerbations and chronic respiratory failure with hypoxemia   He has experienced recurrent exacerbations with frequent hospitalizations over the past three months. Current treatment includes nebulizers with budesonide , Brovana , and Yupelri . Start Otuvair nebulizer twice daily. Maintain prednisone   at 20 mg daily. Use oxygen  therapy at 2 liters consistently. Pulmonary rehabilitation has been initiated. An ABG has been ordered to evaluate for BiPAP due to previous CPAP use and potential benefit. Dupixent was discussed as a biologic option, but it was decided to hold off due to concerns about exacerbation timing with Nucala . A hospice referral has been made for additional support and resources.  Anxiety disorder   His anxiety is exacerbated by prednisone  use. Current management includes sertraline  nightly and Xanax  as needed. There has been a recent increase in anxiety episodes, particularly in the evenings. Prescribe Xanax  0.5 mg, half tablet daily, with the option to increase to half tablet twice daily if needed. Continue sertraline  nightly.  Edema   Edema is managed with furosemide . The current dose is 40 mg daily, which is well-tolerated with normal kidney function. Continue furosemide  40 mg daily.  Palliative care management and hospice referral   Palliative care and hospice referral were discussed due to recurrent hospitalizations and chronic condition. Hospice offers additional resources and support, including potential medication adjustments for comfort. The family is open to hospice referral despite previous hesitations. A referral to hospice has been made for additional support and resources.        Jeric Slagel, NP 12/02/2024  I spent   minutes dedicated to the care of this patient on the date of this encounter to include pre-visit review of records, face-to-face time with the patient discussing conditions above, post visit ordering of testing, clinical documentation with the electronic health record, making appropriate referrals as documented, and communicating necessary findings to members of the patients care team.      [1]  Allergies Allergen Reactions   Tape Other (See Comments)    PLEASE USE COBAN WRAP IN LIEU OF TAPE, as it pulls off the skin!!   Daliresp   [Roflumilast ] Diarrhea and Nausea And Vomiting   Levaquin  [Levofloxacin ] Palpitations and Other (See Comments)    Made the B/P fluctuate and heart raced   Alendronate Anxiety and Other (See Comments)    Jittery/nervous  [2]  Social History Tobacco Use  Smoking Status Former   Current packs/day: 0.00   Average packs/day: 2.0 packs/day for 52.0 years (104.0 ttl pk-yrs)   Types: Cigarettes   Start date: 02/03/1957   Quit date: 02/03/2009   Years since quitting: 15.8  Smokeless Tobacco Never  Tobacco Comments   Counseled to remain smoke free

## 2024-12-02 NOTE — Patient Instructions (Addendum)
 Continue on Ohtuvayre  neb Twice daily   Continue on Budesonide  and Brovana  Neb Twice daily  and Yupelri  Neb daily .  Taper Prednisone  slowly as directed to 20mg  over next few weeks and hold at this dose.  Mucinex  Twice daily  As needed  Cough/congestion  Flutter valve Three times a day  .  Albuterol  Neb or inhaler as needed  Wear Oxygen  2-3 l/m  with activity and At bedtime   to maintain O2 sats >88-90%.  Activity as tolerated.  Aspirations precautions as discussed.  Daily weights Continue on Lasix  as directed.  Refer to hospice.  Go for [facility]  Continue on CPAP At bedtime with Oxygen  2l/m  and with daily naps and with increased shortness of breath if needed Check ABG Xanax  0.5mg  Twice daily  As needed  anxiety  Follow up in 2 weeks with Dr. Darlean or Rochell Puett  NP and As needed   Please contact office for sooner follow up if symptoms do not improve or worsen or seek emergency care

## 2024-12-07 ENCOUNTER — Telehealth: Payer: Self-pay | Admitting: Adult Health

## 2024-12-07 NOTE — Telephone Encounter (Signed)
 Copied from CRM #[id number]. Topic: Clinical - Medical Advice >> Dec 06, 2024 10:51 AM Devaughn RAMAN wrote: Reason for CRM: Nidia with [facility] called to advise pt was formally admitted into hospice services on 12.14.25. pt is requesting Tammy Parrett serve as the hospice attending provider and they were calling to inquire if Tammy wants  to sign her own comfort orders or defer her orders to the hospice physician  [facility] Phone- [phone] secure line in case vm needs to be left

## 2024-12-08 NOTE — Telephone Encounter (Signed)
 Routing to Tammy to make her aware.

## 2024-12-09 NOTE — Telephone Encounter (Signed)
 Typically hospice MD does orders but let me know if I needed to

## 2024-12-09 NOTE — Telephone Encounter (Signed)
 Called [employer] - they confirm completed benefits investigation. Per last OV with Tammy Parrett on 12/02/24, he received Ohtuvayre  on 12/02/24. NFN

## 2024-12-14 ENCOUNTER — Encounter: Payer: Self-pay | Admitting: Internal Medicine

## 2024-12-14 ENCOUNTER — Ambulatory Visit (INDEPENDENT_AMBULATORY_CARE_PROVIDER_SITE_OTHER): Admitting: Internal Medicine

## 2024-12-14 VITALS — BP 130/58 | HR 93 | Ht 65.0 in | Wt 152.8 lb

## 2024-12-14 DIAGNOSIS — J9611 Chronic respiratory failure with hypoxia: Secondary | ICD-10-CM | POA: Diagnosis not present

## 2024-12-14 DIAGNOSIS — Z87891 Personal history of nicotine dependence: Secondary | ICD-10-CM | POA: Diagnosis not present

## 2024-12-14 DIAGNOSIS — J449 Chronic obstructive pulmonary disease, unspecified: Secondary | ICD-10-CM

## 2024-12-14 NOTE — Patient Instructions (Addendum)
 Ok to taper prednisone  by 2.5 mg every 5 days down to 5 mg daily   See Tammy NP in 3 months

## 2024-12-14 NOTE — Assessment & Plan Note (Addendum)
 GOLD 3 COPD/ Quit smoking 01/2009/MM  02/04/12 Spiro: FEV1 36% FVC 57% FEF 25 75    11% -05/29/16  FEV1 0.83 L (31%) FEV1/FVC 0.40 FEF 25-75 0.30 L (14%) no bronchodilator response p ? rx prior, DLCO corrected 60% (hemoglobin 10.9) - 04/15/2019 flutter valve training  - alpha one screen 06/15/2019  MM  Level 178 - 12/27/2019 added ppi bid ac otc for atypical spells of sob  - 01/27/2020 changed lopressor  to bisoprolol  due to refractory wheeze/ sob on lopressor  100 mg daily -  Approx 01/2020 started daily prednisone   - Sinus CT  02/03/20 1. Minimal paranasal sinus mucosal thickening CT chest 02/03/20 1. Coronary artery calcifications are noted suggesting coronary artery disease. 2. Emphysematous disease is noted in the upper lobes bilaterally. 3. Interval development of several old thoracic compression fractures since prior exam. No definite acute fracture is noted.  - 02/24/20 trial of daliresp  250 > could not tol   - 02/19/2021  After extensive coaching inhaler device,  effectiveness =   80% > try change to breztri   - PFT's  02/16/24  FEV1 0.80 (33 % ) ratio 0.49  p 0 % improvement from saba p spiriva  prior to study with DLCO  8.8 (41%)   and FV curve classic severe concavity      - 07/30/2024 added nucala  in effort to reduce pred with celing 20 and floor 5 mg > no better so rx ohtuvayre   11/15/24 >>> much better 12/14/2024   Advised slow taper (2.5 mg every 5 days to a target of 5 mg floor and 20 mg ceiling   F/u a 3 m, sooner prn

## 2024-12-14 NOTE — Progress Notes (Signed)
 "     Paul [name], male    DOB: 12/11/1946    MRN: [id number]   Brief patient profile:  70   yowm MM/Quit smoking  02/03/09  with GOLD III spirometry baseline doe x able to golf. Only using 02 at  [facility] with  maint on symbicort / spiriva  and no chronic pred/ albuterol  twice daily at most  much worse mid March 2020.   PFT 05/29/16: FVC 2.08 L (86%) FEV1 0.83 L (31%) FEV1/FVC 0.40 FEF 25-75 0.30 L (14%) no bronchodilator response TLC 6.56 L (107%) RV 167% ERV 160% DLCO corrected 60% (hemoglobin 10.9) 07/15/13: FVC 2.68 L (60%) FEV1 1.63 L (53%) FEV1/FVC 0.61 FEF 25-75 0.76 L (26%) 02/04/12: FVC 2.18 L (57%) FEV1 1.09 L (36%) FEV1/FVC 0.50 FEF 25-75 0.33 L (11%)   Admit date: 03/19/2019 Discharge date: 03/25/2019   Recommendations for Outpatient Follow-up:  F/u with PCP within a week  for hospital discharge follow up, repeat cbc/bmp at follow up F/u with cardiology for new diagnosis of afib Home 02   Discharge Diagnoses:      Active Hospital Problems    Diagnosis Date Noted   PAF (paroxysmal atrial fibrillation) (HCC)     Acute on chronic respiratory failure with hypoxia (HCC)     COPD exacerbation (HCC) 03/19/2019         Filed Weights    03/23/19 1700 03/24/19 0503 03/25/19 0528  Weight: 60.2 kg 60 kg 62.3 kg      History of present illness: (per admitting MD Dr Allana) PCP: [name], MD  Patient coming from: Home   Chief Complaint: Worsening shortness of breath   HPI: Paul [name] is a 78 y.o. male with history h/o COPD, hyperlipidemia who at baseline was using 2 L O2 via nasal cannula only at night and follows [facility] (Dr. Theophilus) as outpatient presents with worsening shortness of breath.  Patient reports that at baseline he has cough with white phlegm and is able to walk around doing his ADLs without shortness of breath and uses O2 only at night.  2 weeks back he developed productive cough with green phlegm and shortness of breath but no fevers.  He underwent CT  chest with contrast on March 18 which was negative for PE or infiltrates.  He apparently was given doxycycline /prednisone  course at that time which helped him transiently but symptoms worsened again past Monday with shortness of breath on minimum exertion/cough and he noticed his pulse ox was dropping to 74% .  He started using 2 L O2 nasal cannula continuous.  On Tuesday, he called pulmonary office given persistent symptoms and also pulse oximetry showing O2 sats 84% on exertion and 93% at rest in spite of using continuous O2.  He was advised by Dr. Theophilus to use 60 mg prednisone  x3 days followed by 10 mg taper every 3 days.  He was also advised to use nebulizer treatments every 3 hours and continue Symbicort  inhaler twice daily.  Patient was supposed to start 50 mg prednisone  today but woke up feeling extremely short of breath.  He states he is also been having bad bouts of cough at night with wheezing and bronchospasm.   He presented to med [facility] ED where he was noted to be afebrile but hypoxic, required BiPAP for 3 hours, chest x-ray was negative for infiltrates.  Patient denies any recent history of travel or known sick contacts with flu positive or coronavirus positive patients.  He lives at home  with his wife who is recovering from lung cancer and completed chemotherapy 1 month back.   Hospitalist service was called to request direct admission to medical floor but ED to ED transfer facilitated for thorough clinical evaluation prior to requesting medical bed given coronavirus pandemic.  In the ED at [facility], patient has been saturating well on 3 L nasal cannula but appears tachypneic on talking full sentences.  He denies any chest pain, denies any fevers or leg swellings.   Hospital Course:  Active Problems:   COPD exacerbation (HCC)   PAF (paroxysmal atrial fibrillation) (HCC)   Acute on chronic respiratory failure with hypoxia (HCC)     Acute on Hypoxic respiratory failure, COPD  exacerbation, Progression of severe COPD -Emphysema, with no evidence of superimposed acute cardiopulmonary disease -improving on iv steroids, oral zitrhomax, he finished total of 5 days of zithromax  in the hospital, he is discharged on slow prednisone  taper.  - o2 dropped to 86% while ambulating on room air, discharged on continuous home o2 ( previously on nighttime o2 only) -he is to follow with Dr Theophilus    Paroxsymal afib, New diagnosis of afib -he is started on eliquis  (CHADS-VASC 2, age and aortic atherosclerosis), cardizem , lasix  -cardiology consulted recommend Diltiazem  CD 360. Avoid amiodarone and beta blockers given his underlying lung dz. -outpatient echo per cardiology     Aortic and multivessel coronary atherosclerosis  - Changed to high intensity statin, atorvastatin  40mg  PO QD. Check lipids and ALT as outpatient in 3 months.   - ASA 81. Aggressive secondary prevention.  - No chest pain.         History of Present Illness  03/29/2019  Pulmonary/ extended post hosp f/u eval/Paul [name]  Re GOLD III copd ? 02 dep now Chief Complaint  Patient presents with   Hospitalization Follow-up    Breathing has improved some. He has not used his albuterol  inhaler or neb since hospital d/c.  Dyspnea:  mb and back ok off 02 sats upper 80s at end  Cough: none Sleep: recliner x 45 degrees = baseline 02 2lpm hs  SABA use: none  Since d/c on symb 160 2bid and spiriva  each pm (technique 50% on both, see a/p) On pred 50 mg daily on day as ov rec Plan A = Automatic = symbicort  160 Take 2 puffs first thing in am and then another 2 puffs about 12 hours later.  Spiriva  x 2 puffs  right after symbicort   Work on inhaler technique: Plan B = Backup Only use your albuterol  inhaler(Proventil /Proair )  as a rescue medication Plan C = Crisis - only use your albuterol  nebulizer if you first try Plan B and it fails to help > ok to use the nebulizer up to every 4 hours but if start needing it regularly call  for immediate appointment Plan D = Doctor - call me if B and C not adequate Plan E = ER - go to ER or call 911 if all else fails   02  2lpm at bedtime and adjust to keep it over 90% with activity    02/10/2023  post hosp  f/u ov/Paul [name] re:  copd GOLD 3    maint on breztri  and prednisone  20 mg ceiling and 5 mg floor   Chief Complaint  Patient presents with   Follow-up    Breathing improved.  02: 2lpm and titrates as high as 3lpm  Rec No change in medications Make sure you check your oxygen  saturation  AT  your highest  level of activity (not after you stop)   to be sure it stays over 90%        05/07/2023  3 m f/u ov/Paul [name] re: GOLD 3    maint on Breztri  and prednisone  10 mg daily   Chief Complaint  Patient presents with   Follow-up    Doing well  Dyspnea:  30 min moderate pace  Cough: none  Sleeping:  7 in bed blocks and cpap machine no resp cc  SABA use: each am neb whether needs it or now  02: 2lpm / not using at rest and freq not using 02 at all during the day but not monitoring ex sats as rec   Rec Plan A = Automatic = Always=    Breztri  Take 2 puffs first thing in am and then another 2 puffs about 12 hours later.   Plan B = Backup (to supplement plan A, not to replace it) Only use your albuterol  inhaler as a rescue medication Plan C = Crisis (instead of Plan B but only if Plan B stops working) - only use your albuterol  nebulizer if you first try Plan B  Also  Ok to try albuterol  15 min before an activity (on alternating days)  that you know would usually make you short of breath     07/30/2024  f/u ov/Paul [name] re: GOLD 3 COPD/ 02 prn   maint on Breztri / Prednisone  10 mg   Chief Complaint  Patient presents with   Medical Management of Chronic Issues    3mt F/u  Dyspnea:  treadmill x 30 min  at 2.6 slt grade with sats 89-90%  Cough: none  Sleeping: bed blocks x 7 in and one pillow s  resp cc  SABA use: minimal rarely hfa/ never neb  02: 2lpm hs and up 3 on POC  Lung cancer  screening :  referred   Patient Instructions  We will submit your records for approval of Nucala   My office will be contacting you by phone for referral to lung cancer screening   [phone]) Ceiling on prednisone  is 20 mg per day and floor is 5 mg   CTa   10/14/24 Diffuse emphysematous changes are noted. No focal infiltrate or sizable effusion is noted. No parenchymal nodules.  Dec 10 2024 started pulmonary rehab at his own cost    12/14/2024  f/u ov/Paul [name] re:  GOLD 3 COPD/ 02 prn    maint on triple rx  plus ohtuvayre  nebs since 11/15/24  /prednisone  @ 20 mg/day and wife is concerned we may be tapering too quickly, pt wants off. Chief Complaint  Patient presents with   Medical Management of Chronic Issues   COPD    Breathing has been doing well. He has started pulmonary rehab. He is currently on 20 mg pred daily.   Dyspnea:  pulmonary rehab doing ok Cough: thick white  Sleeping: bedf blocks / one pillow on cpap  resp cc  SABA use:  02: 3lpm cont when doing  PT with sats in low 90s on 2lpm sleeping/  sitting RA    No obvious day to day or daytime variability or assoc   purulent sputum or mucus plugs or hemoptysis or cp or chest tightness, subjective wheeze or overt sinus or hb symptoms.    Also denies any obvious fluctuation of symptoms with weather or environmental changes or other aggravating or alleviating factors except as outlined above   No unusual exposure hx or h/o childhood pna/ asthma or  knowledge of premature birth.  Current Allergies, Complete Past Medical History, Past Surgical History, Family History, and Social History were reviewed in [employer] record.  ROS  The following are not active complaints unless bolded Hoarseness, sore throat, dysphagia, dental problems, itching, sneezing,  nasal congestion or discharge of excess mucus or purulent secretions, ear ache,   fever, chills, sweats, unintended wt loss or wt gain, classically pleuritic  or exertional cp,  orthopnea pnd or arm/hand swelling  or leg swelling, presyncope, palpitations, abdominal pain, anorexia, nausea, vomiting, diarrhea  or change in bowel habits or change in bladder habits, change in stools or change in urine, dysuria, hematuria,  rash, arthralgias, visual complaints, headache, numbness, weakness or ataxia or problems with walking or coordination,  change in mood or  memory.          Outpatient Medications Prior to Visit  Medication Sig Dispense Refill   albuterol  (VENTOLIN  HFA) 108 (90 Base) MCG/ACT inhaler Inhale 1-2 puffs into the lungs every 6 (six) hours as needed for wheezing or shortness of breath. 18 g 2   ALPRAZolam  (XANAX ) 0.5 MG tablet Take 1 tablet (0.5 mg total) by mouth 2 (two) times daily as needed for anxiety. F41.1 60 tablet 0   amLODipine  (NORVASC ) 5 MG tablet Take 1 tablet (5 mg total) by mouth daily. 30 tablet 1   arformoterol  (BROVANA ) 15 MCG/2ML NEBU Take 2 mLs (15 mcg total) by nebulization 2 (two) times daily. 120 mL 11   Ascorbic Acid  (VITAMIN C ) 1000 MG tablet Take 1,000 mg by mouth daily.     aspirin  EC 81 MG tablet Take 1 tablet (81 mg total) by mouth daily. Swallow whole. 90 tablet 3   atorvastatin  (LIPITOR) 40 MG tablet TAKE 1 TABLET BY MOUTH EVERYDAY AT BEDTIME 90 tablet 1   budesonide  (PULMICORT ) 0.25 MG/2ML nebulizer solution Take 2 mLs (0.25 mg total) by nebulization 2 (two) times daily. 60 mL 12   CALCIUM  PO Take 1 tablet by mouth daily.     Cholecalciferol  (VITAMIN D3) 50 MCG (2000 UT) TABS Take 2,000 Units by mouth in the morning.     dapagliflozin  propanediol (FARXIGA ) 10 MG TABS tablet Take 1 tablet (10 mg total) by mouth daily. 90 tablet 4   denosumab  (PROLIA ) 60 MG/ML SOSY injection Inject 60 mg into the skin every 6 (six) months. 180 mL 0   digoxin  (LANOXIN ) 0.125 MG tablet TAKE 1 TABLET BY MOUTH DAILY 90 tablet 2   Ensifentrine  (OHTUVAYRE ) 3 MG/2.5ML SUSP Take 1 ampule by nebulization 2 (two) times daily.     ferrous  sulfate 325 (65 FE) MG tablet Take 325 mg by mouth 2 (two) times daily with a meal.     furosemide  (LASIX ) 40 MG tablet Take 1 tablet (40 mg total) by mouth daily. 30 tablet 1   Guaifenesin  (MUCINEX  MAXIMUM STRENGTH) 1200 MG TB12 Take 1,200 mg by mouth daily.     levothyroxine  (SYNTHROID ) 25 MCG tablet Take 1 tablet (25 mcg total) by mouth daily. 90 tablet 3   Melatonin 12 MG TABS Take 12 mg by mouth at bedtime.     Multiple Vitamin (MULTIVITAMIN WITH MINERALS) TABS tablet Take 1 tablet by mouth daily with breakfast.     Omega-3 Fatty Acids  (FISH OIL) 1200 MG CAPS Take 1,200 mg by mouth at bedtime.     OXYGEN  Inhale 2-3 L/min into the lungs continuous.     pantoprazole  (PROTONIX ) 40 MG tablet Take 1 tablet (40 mg total) by  mouth 2 (two) times daily. 180 tablet 3   predniSONE  (DELTASONE ) 10 MG tablet Take 5-20 mg by mouth daily.     Probiotic Product (PROBIOTIC DAILY) CAPS Take 1 capsule by mouth in the morning.     revefenacin  (YUPELRI ) 175 MCG/3ML nebulizer solution Take 3 mLs (175 mcg total) by nebulization daily. 90 mL 11   sertraline  (ZOLOFT ) 100 MG tablet Take 1 tablet (100 mg total) by mouth daily. 90 tablet 0   No facility-administered medications prior to visit.     Past Medical History:  Diagnosis Date   Anemia    Aortic atherosclerosis    Aspiration pneumonia due to inhalation of vomitus (HCC) 02/03/2022   Atrial fibrillation (HCC)    Back pain    COPD (chronic obstructive pulmonary disease) (HCC)    COPD exacerbation (HCC) 12/27/2021   COVID-19 12/24/2020   Diabetes mellitus without complication (HCC)    Dizziness 04/08/2022   Elevated PSA    GAD (generalized anxiety disorder)    GERD (gastroesophageal reflux disease)    High cholesterol    Hypertension    Lingular pneumonia 01/05/2021   See cxr 01/06/20 rx with zpak/ cefipime x 5 days and then developed purulent sputum again 01/12/21 so rx levcaquin 500 mg daily x 7 days then return for cxr before more    Lung nodule <  6cm on CT 05/29/2016   Osteoporosis    Oxygen  deficiency    Pneumonia    January 2022   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 Cindie with a 20 mm Watchman FLX device   RSV bronchitis 01/27/2022   Vertebral compression fracture (HCC) 05/29/2019   T8 compression fracture noted on CT scan 05/28/2019 inpatient with chronic steroid dependent COPD - rx calcitonin nasal spray rx per PCP               Objective:    Wts  12/14/2024     152  07/30/2024         160  05/07/2023       151  02/10/2023       157   03/19/2022       152  05/29/2021          155 02/19/2021        151  10/03/2020      155  02/24/2020         137  01/27/2020         144 10/26/2019        156  07/26/2019          144   06/15/19 136 lb (61.7 kg)  05/29/19 134 lb 7.7 oz (61 kg)  05/28/19 134 lb 3.2 oz (60.9 kg)   Vital signs reviewed  12/14/2024  - Note at rest 02 sats  94% on RA   General appearance:    pleasant amb wm nad /   HEENT :  Oropharynx  full dentures       NECK :  without JVD/Nodes/TM/ nl carotid upstrokes bilaterally   LUNGS: no acc muscle use,  Mod barrel  contour chest wall with bilateral  Distant bs s audible wheeze and  without cough on insp or exp maneuvers and mod  Hyperresonant  to  percussion bilaterally     CV:  RRR  no s3 or murmur or increase in P2, and no edema   ABD:  soft and nontender with pos mid insp Hoover's  in the supine position. No bruits or organomegaly appreciated, bowel sounds nl  MS:   Ext warm without deformities or   obvious joint restrictions , calf tenderness, cyanosis or clubbing  SKIN: warm and dry without lesions    NEURO:  alert, approp, nl sensorium with  no motor or cerebellar deficits apparent.          Assessment   Assessment & Plan COPD GOLD  III  GOLD 3 COPD/ Quit smoking 01/2009/MM  02/04/12 Spiro: FEV1 36% FVC 57% FEF 25 75    11% -05/29/16  FEV1 0.83 L (31%) FEV1/FVC 0.40 FEF 25-75 0.30 L (14%) no  bronchodilator response p ? rx prior, DLCO corrected 60% (hemoglobin 10.9) - 04/15/2019 flutter valve training  - alpha one screen 06/15/2019  MM  Level 178 - 12/27/2019 added ppi bid ac otc for atypical spells of sob  - 01/27/2020 changed lopressor  to bisoprolol  due to refractory wheeze/ sob on lopressor  100 mg daily -  Approx 01/2020 started daily prednisone   - Sinus CT  02/03/20 1. Minimal paranasal sinus mucosal thickening CT chest 02/03/20 1. Coronary artery calcifications are noted suggesting coronary artery disease. 2. Emphysematous disease is noted in the upper lobes bilaterally. 3. Interval development of several old thoracic compression fractures since prior exam. No definite acute fracture is noted.  - 02/24/20 trial of daliresp  250 > could not tol   - 02/19/2021  After extensive coaching inhaler device,  effectiveness =   80% > try change to breztri   - PFT's  02/16/24  FEV1 0.80 (33 % ) ratio 0.49  p 0 % improvement from saba p spiriva  prior to study with DLCO  8.8 (41%)   and FV curve classic severe concavity      - 07/30/2024 added nucala  in effort to reduce pred with celing 20 and floor 5 mg > no better so rx ohtuvayre   11/15/24 >>> much better 12/14/2024   Advised slow taper (2.5 mg every 5 days to a target of 5 mg floor and 20 mg ceiling   F/u a 3 m, sooner prn    Chronic respiratory failure with hypoxia (HCC) Hs 02 since around 2005 - referred for best fit 05/29/2021  - as of 07/30/2024  on 2lpm hs and up to 3lpm POC but not titrating higher and rec keeps sats > 90% with exertion   Again emphasized goal is sats > 90% 24/7       Each maintenance medication was reviewed in detail including emphasizing most importantly the difference between maintenance and prns and under what circumstances the prns are to be triggered using an action plan format where appropriate.  Total time for H and P, chart review, counseling, reviewing hfa/neb/02 /pulse ox  device(s) and generating customized AVS  unique to this office visit / same day charting =          AVS  Patient Instructions  Ok to taper prednisone  by 2.5 mg every 5 days down to 5 mg daily   See Tammy NP in 3 months      Ozell America, MD 12/14/2024                                 "

## 2024-12-14 NOTE — Assessment & Plan Note (Addendum)
 Hs 02 since around 2005 - referred for best fit 05/29/2021  - as of 07/30/2024  on 2lpm hs and up to 3lpm POC but not titrating higher and rec keeps sats > 90% with exertion   Again emphasized goal is sats > 90% 24/7       Each maintenance medication was reviewed in detail including emphasizing most importantly the difference between maintenance and prns and under what circumstances the prns are to be triggered using an action plan format where appropriate.  Total time for H and P, chart review, counseling, reviewing hfa/neb/02 /pulse ox  device(s) and generating customized AVS unique to this office visit / same day charting = 

## 2024-12-22 ENCOUNTER — Encounter: Payer: Self-pay | Admitting: Family Medicine

## 2024-12-22 ENCOUNTER — Other Ambulatory Visit: Payer: Self-pay

## 2024-12-24 ENCOUNTER — Other Ambulatory Visit: Payer: Self-pay | Admitting: Adult Health

## 2025-01-03 ENCOUNTER — Telehealth: Payer: Self-pay

## 2025-01-03 NOTE — Telephone Encounter (Signed)
 Auth Submission: NO AUTH NEEDED Site of care: Site of care: [facility] Payer: Medicare A/B with BCBS supplement Medication & CPT/J Code(s) submitted: Nucala  Diagnosis Code:  Route of submission (phone, fax, portal):  Phone # Fax # Auth type: Buy/Bill PB Units/visits requested: 100mg  x 4 remaining doses if the patient continues Reference number:  Approval from: 01/03/25 to 01/22/26

## 2025-01-07 ENCOUNTER — Other Ambulatory Visit: Payer: Self-pay

## 2025-01-07 DIAGNOSIS — E1159 Type 2 diabetes mellitus with other circulatory complications: Secondary | ICD-10-CM

## 2025-01-07 DIAGNOSIS — E782 Mixed hyperlipidemia: Secondary | ICD-10-CM

## 2025-01-09 ENCOUNTER — Other Ambulatory Visit: Payer: Self-pay | Admitting: Internal Medicine

## 2025-01-10 ENCOUNTER — Other Ambulatory Visit

## 2025-01-12 ENCOUNTER — Ambulatory Visit: Admitting: Family Medicine

## 2025-01-12 VITALS — BP 138/62 | HR 83 | Temp 98.1°F | Ht 65.0 in | Wt 156.0 lb

## 2025-01-12 DIAGNOSIS — F411 Generalized anxiety disorder: Secondary | ICD-10-CM | POA: Diagnosis not present

## 2025-01-12 DIAGNOSIS — Z794 Long term (current) use of insulin: Secondary | ICD-10-CM | POA: Diagnosis not present

## 2025-01-12 DIAGNOSIS — E1169 Type 2 diabetes mellitus with other specified complication: Secondary | ICD-10-CM

## 2025-01-12 DIAGNOSIS — E782 Mixed hyperlipidemia: Secondary | ICD-10-CM | POA: Diagnosis not present

## 2025-01-12 DIAGNOSIS — M8000XD Age-related osteoporosis with current pathological fracture, unspecified site, subsequent encounter for fracture with routine healing: Secondary | ICD-10-CM

## 2025-01-12 DIAGNOSIS — E039 Hypothyroidism, unspecified: Secondary | ICD-10-CM | POA: Diagnosis not present

## 2025-01-12 NOTE — Assessment & Plan Note (Signed)
 Anxiety well-controlled with Zoloft  and Xanax . - Continue Zoloft  100 mg daily. - Continue Xanax  0.5 mg daily.

## 2025-01-12 NOTE — Progress Notes (Signed)
 "  Subjective:  Patient ID: Paul [name], male    DOB: 08/25/1946  Age: 79 y.o. MRN: [id number]  Chief Complaint  Patient presents with   Medical Management of Chronic Issues    HPI: Discussed the use of AI scribe software for clinical note transcription with the patient, who gave verbal consent to proceed.  History of Present Illness Paul [name] is a 79 year old male with COPD and atrial fibrillation who presents for medication management and follow-up care.  Dyspnea and chronic obstructive pulmonary disease (copd) symptoms - Currently under hospice care for heart and lung-related issues - Not using any inhalers except for an emergency inhaler - On prednisone , reduced to 10 mg per day - Uses oxygen  at 2 liters most of the time, increasing to 2.5 or 3 liters with strenuous activity - Nebulizer treatments provide significant relief  Cardiac arrhythmia and heart failure symptoms - Atrial fibrillation managed with digoxin  - Takes amlodipine  5 mg daily for blood pressure control - On Lasix  40 mg daily for heart-related issues  Anxiety and mood symptoms - Anxiety is more prominent than depression - On Zoloft  100 mg daily for anxiety - Takes half a Xanax  in the morning and at night  Diabetes mellitus - Diabetes reportedly well-controlled - Not currently checking blood sugars regularly - On Farxiga  10 mg daily, prescribed by cardiology  Osteoporosis - On Prolia  injections every six months  Gastrointestinal symptoms - On acid reflux medication - On iron supplements  Nutritional status and appetite - Good appetite and eating habits, though not particularly healthy - Enjoys holiday meals  Recent laboratory monitoring - No blood work since last hospitalization       07/26/2024    8:34 AM 04/01/2024   10:35 AM 05/14/2023   11:23 AM 04/01/2023   11:15 AM 03/26/2023    8:54 AM  Depression screen PHQ 2/9  Decreased Interest 0 0 0 0 0  Down, Depressed, Hopeless 0 0 0 0 0  PHQ  - 2 Score 0 0 0 0 0        05/20/2024    8:57 AM  Fall Risk   Falls in the past year? 0    Patient Care Team: [name], MD as PCP - General (Family Medicine) Jeffrie Oneil BROCKS, MD as PCP - Cardiology (Cardiology) Cindie Ole DASEN, MD (Inactive) as PCP - Electrophysiology (Cardiology) Brien Belvie FORBES, MD as Attending Physician (Pulmonary Disease) Darlean Ozell NOVAK, MD as Consulting Physician (Pulmonary Disease) [facility] as Registered Nurse [facility] and Palliative Medicine) Carlin Delon BROCKS, NP as Nurse Practitioner (Cardiology)   Review of Systems  Constitutional:  Negative for chills, fatigue and fever.  HENT:  Negative for congestion, ear pain and sore throat.   Respiratory:  Negative for cough and shortness of breath.   Cardiovascular:  Negative for chest pain.  Gastrointestinal:  Negative for abdominal pain, constipation, diarrhea, nausea and vomiting.  Genitourinary:  Negative for dysuria and frequency.  Musculoskeletal:  Negative for arthralgias and myalgias.  Neurological:  Negative for dizziness and headaches.  Psychiatric/Behavioral:  Negative for dysphoric mood.        No dysphoria    Medications Ordered Prior to Encounter[1] Past Medical History:  Diagnosis Date   Anemia    Aortic atherosclerosis    Aspiration pneumonia due to inhalation of vomitus (HCC) 02/03/2022   Atrial fibrillation (HCC)    Back pain    COPD (chronic obstructive pulmonary disease) (HCC)    COPD  exacerbation (HCC) 12/27/2021   COVID-19 12/24/2020   Diabetes mellitus without complication (HCC)    Dizziness 04/08/2022   Elevated PSA    GAD (generalized anxiety disorder)    GERD (gastroesophageal reflux disease)    High cholesterol    Hypertension    Lingular pneumonia 01/05/2021   See cxr 01/06/20 rx with zpak/ cefipime x 5 days and then developed purulent sputum again 01/12/21 so rx levcaquin 500 mg daily x 7 days then return for cxr before more    Lung nodule < 6cm on  CT 05/29/2016   Osteoporosis    Oxygen  deficiency    Pneumonia    January 2022   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 Cindie with a 20 mm Watchman FLX device   RSV bronchitis 01/27/2022   Vertebral compression fracture (HCC) 05/29/2019   T8 compression fracture noted on CT scan 05/28/2019 inpatient with chronic steroid dependent COPD - rx calcitonin nasal spray rx per PCP    Past Surgical History:  Procedure Laterality Date   CATARACT EXTRACTION Right    ELBOW SURGERY  07/2022   surgery on olecranon bursa. Dr. Alyse   IR KYPHO EA ADDL LEVEL THORACIC OR LUMBAR  11/28/2021   LEFT ATRIAL APPENDAGE OCCLUSION N/A 08/02/2021   Procedure: LEFT ATRIAL APPENDAGE OCCLUSION;  Surgeon: Wonda Sharper, MD;  Location: [facility] INVASIVE CV LAB;  Service: Cardiovascular;  Laterality: N/A;   SKIN SURGERY     shoulders and chest   TEE WITHOUT CARDIOVERSION N/A 08/02/2021   Procedure: TRANSESOPHAGEAL ECHOCARDIOGRAM (TEE);  Surgeon: Wonda Sharper, MD;  Location: [facility] INVASIVE CV LAB;  Service: Cardiovascular;  Laterality: N/A;   TEE WITHOUT CARDIOVERSION N/A 09/13/2021   Procedure: TRANSESOPHAGEAL ECHOCARDIOGRAM (TEE);  Surgeon: Francyne Headland, MD;  Location: [facility] ENDOSCOPY;  Service: Cardiovascular;  Laterality: N/A;    Family History  Problem Relation Age of Onset   Heart disease Brother    Heart disease Mother    Heart disease Father    Cancer Paternal Uncle    Social History   Socioeconomic History   Marital status: Married    Spouse name: Not on file   Number of children: 3   Years of education: Not on file   Highest education level: 10th grade  Occupational History   Occupation: retired    Comment: truck hospital doctor  Tobacco Use   Smoking status: Former    Current packs/day: 0.00    Average packs/day: 2.0 packs/day for 52.0 years (104.0 ttl pk-yrs)    Types: Cigarettes    Start date: 02/03/1957    Quit date: 02/03/2009    Years since quitting:  15.9   Smokeless tobacco: Never   Tobacco comments:    Counseled to remain smoke free  Vaping Use   Vaping status: Never Used  Substance and Sexual Activity   Alcohol use: Not Currently    Alcohol/week: 0.0 standard drinks of alcohol    Comment: occ   Drug use: No   Sexual activity: Not on file  Other Topics Concern   Not on file  Social History Narrative   Originally from [place]. Previously worked driving a surveyor, minerals. No pets currently. No mold exposure. 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       wears sunscreen, brushes and flosses daily, see's dentist bi-annually, has smoke/carbon monoxide detectors, wears a seatbelt and practices gun safety  Social Drivers of Health   Tobacco Use: Medium Risk (12/14/2024)   Patient History    Smoking Tobacco Use: Former    Smokeless Tobacco Use: Never    Passive Exposure: Not on file  Financial Resource Strain: Low Risk (07/19/2023)   Overall Financial Resource Strain (CARDIA)    Difficulty of Paying Living Expenses: Not hard at all  Food Insecurity: No Food Insecurity (11/20/2024)   Epic    Worried About Programme Researcher, Broadcasting/film/video in the Last Year: Never true    Ran Out of Food in the Last Year: Never true  Transportation Needs: No Transportation Needs (11/20/2024)   Epic    Lack of Transportation (Medical): No    Lack of Transportation (Non-Medical): No  Physical Activity: Insufficiently Active (07/19/2023)   Exercise Vital Sign    Days of Exercise per Week: 3 days    Minutes of Exercise per Session: 30 min  Stress: No Stress Concern Present (07/19/2023)   [employer] of Occupational Health - Occupational Stress Questionnaire    Feeling of Stress : Not at all  Social Connections: Socially Integrated (11/20/2024)   Social Connection and Isolation Panel    Frequency of Communication with Friends and Family: More than three times a week    Frequency of Social  Gatherings with Friends and Family: More than three times a week    Attends Religious Services: More than 4 times per year    Active Member of Clubs or Organizations: Yes    Attends Banker Meetings: More than 4 times per year    Marital Status: Married  Depression (PHQ2-9): Low Risk (07/26/2024)   Depression (PHQ2-9)    PHQ-2 Score: 0  Alcohol Screen: Low Risk (07/26/2024)   Alcohol Screen    Last Alcohol Screening Score (AUDIT): 0  Housing: Low Risk (11/20/2024)   Epic    Unable to Pay for Housing in the Last Year: No    Number of Times Moved in the Last Year: 0    Homeless in the Last Year: No  Utilities: Not At Risk (11/20/2024)   Epic    Threatened with loss of utilities: No  Health Literacy: Adequate Health Literacy (07/23/2023)   B1300 Health Literacy    Frequency of need for help with medical instructions: Never    Objective:  BP 138/62   Pulse 83   Temp 98.1 F (36.7 C)   Ht 5' 5 (1.651 m)   Wt 156 lb (70.8 kg)   SpO2 98%   BMI 25.96 kg/m      01/12/2025    9:46 AM 12/14/2024    2:01 PM 12/02/2024    1:17 PM  BP/Weight  Systolic BP 138 130 149  Diastolic BP 62 58 55  Wt. (Lbs) 156 152.8 152.4  BMI 25.96 kg/m2 25.43 kg/m2 25.36 kg/m2    Physical Exam Vitals reviewed.  Constitutional:      Appearance: Normal appearance.  Neck:     Vascular: No carotid bruit.  Cardiovascular:     Rate and Rhythm: Normal rate and regular rhythm.     Pulses: Normal pulses.     Heart sounds: Normal heart sounds.  Pulmonary:     Effort: Pulmonary effort is normal.     Breath sounds: Normal breath sounds. No wheezing, rhonchi or rales.  Abdominal:     General: Bowel sounds are normal.     Palpations: Abdomen is soft.     Tenderness: There is no abdominal tenderness.  Neurological:  Mental Status: He is alert and oriented to person, place, and time.  Psychiatric:        Mood and Affect: Mood normal.        Behavior: Behavior normal.      Diabetic  foot exam was performed with the following findings:   No deformities, ulcerations, or other skin breakdown Normal sensation of 10g monofilament Intact posterior tibialis and dorsalis pedis pulses      Lab Results  Component Value Date   WBC 12.1 (H) 01/12/2025   HGB 12.5 (L) 01/12/2025   HCT 41.8 01/12/2025   PLT 323 01/12/2025   GLUCOSE 133 (H) 01/12/2025   CHOL 180 01/12/2025   TRIG 180 (H) 01/12/2025   HDL 54 01/12/2025   LDLCALC 95 01/12/2025   ALT 39 01/12/2025   AST 31 01/12/2025   NA 146 (H) 01/12/2025   K 4.4 01/12/2025   CL 101 01/12/2025   CREATININE 1.00 01/12/2025   BUN 14 01/12/2025   CO2 27 01/12/2025   TSH 0.495 09/08/2024   INR 0.9 01/25/2023   HGBA1C 7.4 (H) 11/21/2024    Results for orders placed or performed in visit on 01/12/25  CBC with Differential/Platelet   Collection Time: 01/12/25 10:24 AM  Result Value Ref Range   WBC 12.1 (H) 3.4 - 10.8 x10E3/uL   RBC 5.13 4.14 - 5.80 x10E6/uL   Hemoglobin 12.5 (L) 13.0 - 17.7 g/dL   Hematocrit 58.1 62.4 - 51.0 %   MCV 82 79 - 97 fL   MCH 24.4 (L) 26.6 - 33.0 pg   MCHC 29.9 (L) 31.5 - 35.7 g/dL   RDW 85.4 88.3 - 84.5 %   Platelets 323 150 - 450 x10E3/uL   Neutrophils 81 Not Estab. %   Lymphs 11 Not Estab. %   Monocytes 7 Not Estab. %   Eos 0 Not Estab. %   Basos 0 Not Estab. %   Neutrophils Absolute 9.8 (H) 1.4 - 7.0 x10E3/uL   Lymphocytes Absolute 1.4 0.7 - 3.1 x10E3/uL   Monocytes Absolute 0.9 0.1 - 0.9 x10E3/uL   EOS (ABSOLUTE) 0.0 0.0 - 0.4 x10E3/uL   Basophils Absolute 0.0 0.0 - 0.2 x10E3/uL   Immature Granulocytes 1 Not Estab. %   Immature Grans (Abs) 0.1 0.0 - 0.1 x10E3/uL  Comprehensive metabolic panel with GFR   Collection Time: 01/12/25 10:24 AM  Result Value Ref Range   Glucose 133 (H) 70 - 99 mg/dL   BUN 14 8 - 27 mg/dL   Creatinine, Ser 8.99 0.76 - 1.27 mg/dL   eGFR 77 >40 fO/fpw/8.26   BUN/Creatinine Ratio 14 10 - 24   Sodium 146 (H) 134 - 144 mmol/L   Potassium 4.4 3.5 - 5.2  mmol/L   Chloride 101 96 - 106 mmol/L   CO2 27 20 - 29 mmol/L   Calcium  8.9 8.6 - 10.2 mg/dL   Total Protein 6.4 6.0 - 8.5 g/dL   Albumin  4.3 3.8 - 4.8 g/dL   Globulin, Total 2.1 1.5 - 4.5 g/dL   Bilirubin Total 0.3 0.0 - 1.2 mg/dL   Alkaline Phosphatase 99 47 - 123 IU/L   AST 31 0 - 40 IU/L   ALT 39 0 - 44 IU/L  Lipid panel   Collection Time: 01/12/25 10:24 AM  Result Value Ref Range   Cholesterol, Total 180 100 - 199 mg/dL   Triglycerides 819 (H) 0 - 149 mg/dL   HDL 54 >60 mg/dL   VLDL Cholesterol Cal 31 5 -  40 mg/dL   LDL Chol Calc (NIH) 95 0 - 99 mg/dL   Chol/HDL Ratio 3.3 0.0 - 5.0 ratio  .  Assessment & Plan:   Assessment & Plan Mixed hyperlipidemia Well controlled.  No changes to medicines. Lipitor 40 mg once daily.  Continue to work on eating a healthy diet and exercise.  Orders:   CBC with Differential/Platelet   Comprehensive metabolic panel with GFR   Lipid panel   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HCC) Control: Well controlled. Recommend check sugars fasting daily. Recommend check feet daily. Recommend annual eye exams. Medicines: None Continue to work on eating a healthy diet and exercise.  Orders:   Microalbumin / creatinine urine ratio   Age-related osteoporosis with current pathological fracture with routine healing, subsequent encounter Age-related osteoporosis with current pathological fracture Osteoporosis management ongoing with Prolia  injections. - Referred to [facility] for Prolia  injections.    Hypothyroidism, unspecified type Continue outpatient regimen of Synthroid     Generalized anxiety disorder Anxiety well-controlled with Zoloft  and Xanax . - Continue Zoloft  100 mg daily. - Continue Xanax  0.5 mg daily.      Body mass index is 25.96 kg/m.    No orders of the defined types were placed in this encounter.   Orders Placed This Encounter  Procedures   CBC with Differential/Platelet   Comprehensive  metabolic panel with GFR   Lipid panel   Microalbumin / creatinine urine ratio     I,Marla I Leal-Borjas,acting as a scribe for [name] Free, MD.,have documented all relevant documentation on the behalf of [name] Free, MD,as directed by  [name] Free, MD while in the presence of [name] Free, MD.   Follow-up: Return in about 3 months (around 04/12/2025) for chronic follow up.  An After Visit Summary was printed and given to the patient.  I attest that I have reviewed this visit and agree with the plan scribed by my staff.   [name] Free, MD [facility] [phone]       [1]  Current Outpatient Medications on File Prior to Visit  Medication Sig Dispense Refill   albuterol  (VENTOLIN  HFA) 108 (90 Base) MCG/ACT inhaler Inhale 1-2 puffs into the lungs every 6 (six) hours as needed for wheezing or shortness of breath. 18 g 2   ALPRAZolam  (XANAX ) 0.5 MG tablet Take 1 tablet (0.5 mg total) by mouth 2 (two) times daily as needed for anxiety. F41.1 60 tablet 0   amLODipine  (NORVASC ) 5 MG tablet Take 1 tablet (5 mg total) by mouth daily. 30 tablet 1   arformoterol  (BROVANA ) 15 MCG/2ML NEBU Take 2 mLs (15 mcg total) by nebulization 2 (two) times daily. 120 mL 11   Ascorbic Acid  (VITAMIN C ) 1000 MG tablet Take 1,000 mg by mouth daily.     aspirin  EC 81 MG tablet Take 1 tablet (81 mg total) by mouth daily. Swallow whole. 90 tablet 3   atorvastatin  (LIPITOR) 40 MG tablet TAKE 1 TABLET BY MOUTH EVERYDAY AT BEDTIME 90 tablet 1   budesonide  (PULMICORT ) 0.25 MG/2ML nebulizer solution Take 2 mLs (0.25 mg total) by nebulization 2 (two) times daily. 60 mL 12   CALCIUM  PO Take 1 tablet by mouth daily.     Cholecalciferol  (VITAMIN D3) 50 MCG (2000 UT) TABS Take 2,000 Units by mouth in the morning.     dapagliflozin  propanediol (FARXIGA ) 10 MG TABS tablet Take 1 tablet (10 mg total) by mouth daily. 90 tablet 4   denosumab  (PROLIA ) 60  MG/ML SOSY injection Inject 60 mg into the skin every 6 (six)  months. 180 mL 0   digoxin  (LANOXIN ) 0.125 MG tablet TAKE 1 TABLET BY MOUTH DAILY 90 tablet 2   Ensifentrine  (OHTUVAYRE ) 3 MG/2.5ML SUSP Take 1 ampule by nebulization 2 (two) times daily.     ferrous sulfate  325 (65 FE) MG tablet Take 325 mg by mouth 2 (two) times daily with a meal.     furosemide  (LASIX ) 40 MG tablet TAKE 1 TABLET BY MOUTH EVERY DAY 90 tablet 1   Guaifenesin  (MUCINEX  MAXIMUM STRENGTH) 1200 MG TB12 Take 1,200 mg by mouth daily.     levothyroxine  (SYNTHROID ) 25 MCG tablet Take 1 tablet (25 mcg total) by mouth daily. 90 tablet 3   Melatonin 12 MG TABS Take 12 mg by mouth at bedtime.     Multiple Vitamin (MULTIVITAMIN WITH MINERALS) TABS tablet Take 1 tablet by mouth daily with breakfast.     OXYGEN  Inhale 2-3 L/min into the lungs continuous.     pantoprazole  (PROTONIX ) 40 MG tablet Take 1 tablet (40 mg total) by mouth 2 (two) times daily. 180 tablet 3   predniSONE  (DELTASONE ) 10 MG tablet 1/2 TO 2 TABLETS DAILY AS DIRECTED 100 tablet 1   Probiotic Product (PROBIOTIC DAILY) CAPS Take 1 capsule by mouth in the morning.     revefenacin  (YUPELRI ) 175 MCG/3ML nebulizer solution Take 3 mLs (175 mcg total) by nebulization daily. 90 mL 11   sertraline  (ZOLOFT ) 100 MG tablet Take 1 tablet (100 mg total) by mouth daily. 90 tablet 0   No current facility-administered medications on file prior to visit.   "

## 2025-01-12 NOTE — Assessment & Plan Note (Signed)
 [name]

## 2025-01-12 NOTE — Assessment & Plan Note (Signed)
 Control: Well controlled. Recommend check sugars fasting daily. Recommend check feet daily. Recommend annual eye exams. Medicines: None Continue to work on eating a healthy diet and exercise.  Orders:   Microalbumin / creatinine urine ratio

## 2025-01-12 NOTE — Assessment & Plan Note (Signed)
 Paul [name]

## 2025-01-13 ENCOUNTER — Ambulatory Visit: Payer: Self-pay | Admitting: Family Medicine

## 2025-01-13 LAB — CBC WITH DIFFERENTIAL/PLATELET
Basophils Absolute: 0 x10E3/uL (ref 0.0–0.2)
Basos: 0 %
EOS (ABSOLUTE): 0 x10E3/uL (ref 0.0–0.4)
Eos: 0 %
Hematocrit: 41.8 % (ref 37.5–51.0)
Hemoglobin: 12.5 g/dL — ABNORMAL LOW (ref 13.0–17.7)
Immature Grans (Abs): 0.1 x10E3/uL (ref 0.0–0.1)
Immature Granulocytes: 1 %
Lymphocytes Absolute: 1.4 x10E3/uL (ref 0.7–3.1)
Lymphs: 11 %
MCH: 24.4 pg — ABNORMAL LOW (ref 26.6–33.0)
MCHC: 29.9 g/dL — ABNORMAL LOW (ref 31.5–35.7)
MCV: 82 fL (ref 79–97)
Monocytes Absolute: 0.9 x10E3/uL (ref 0.1–0.9)
Monocytes: 7 %
Neutrophils Absolute: 9.8 x10E3/uL — ABNORMAL HIGH (ref 1.4–7.0)
Neutrophils: 81 %
Platelets: 323 x10E3/uL (ref 150–450)
RBC: 5.13 x10E6/uL (ref 4.14–5.80)
RDW: 14.5 % (ref 11.6–15.4)
WBC: 12.1 x10E3/uL — ABNORMAL HIGH (ref 3.4–10.8)

## 2025-01-13 LAB — COMPREHENSIVE METABOLIC PANEL WITH GFR
ALT: 39 IU/L (ref 0–44)
AST: 31 IU/L (ref 0–40)
Albumin: 4.3 g/dL (ref 3.8–4.8)
Alkaline Phosphatase: 99 IU/L (ref 47–123)
BUN/Creatinine Ratio: 14 (ref 10–24)
BUN: 14 mg/dL (ref 8–27)
Bilirubin Total: 0.3 mg/dL (ref 0.0–1.2)
CO2: 27 mmol/L (ref 20–29)
Calcium: 8.9 mg/dL (ref 8.6–10.2)
Chloride: 101 mmol/L (ref 96–106)
Creatinine, Ser: 1 mg/dL (ref 0.76–1.27)
Globulin, Total: 2.1 g/dL (ref 1.5–4.5)
Glucose: 133 mg/dL — ABNORMAL HIGH (ref 70–99)
Potassium: 4.4 mmol/L (ref 3.5–5.2)
Sodium: 146 mmol/L — ABNORMAL HIGH (ref 134–144)
Total Protein: 6.4 g/dL (ref 6.0–8.5)
eGFR: 77 mL/min/1.73

## 2025-01-13 LAB — LIPID PANEL
Chol/HDL Ratio: 3.3 ratio (ref 0.0–5.0)
Cholesterol, Total: 180 mg/dL (ref 100–199)
HDL: 54 mg/dL
LDL Chol Calc (NIH): 95 mg/dL (ref 0–99)
Triglycerides: 180 mg/dL — ABNORMAL HIGH (ref 0–149)
VLDL Cholesterol Cal: 31 mg/dL (ref 5–40)

## 2025-01-15 ENCOUNTER — Other Ambulatory Visit: Payer: Self-pay | Admitting: Family Medicine

## 2025-01-17 ENCOUNTER — Encounter: Payer: Self-pay | Admitting: Family Medicine

## 2025-01-18 ENCOUNTER — Other Ambulatory Visit: Payer: Self-pay | Admitting: Family Medicine

## 2025-01-20 ENCOUNTER — Telehealth: Payer: Self-pay | Admitting: Pharmacy Technician

## 2025-01-20 NOTE — Telephone Encounter (Signed)
 Auth Submission: NO AUTH NEEDED Site of care: Site of care: [facility] Payer: MEDICARE A/B & BSBS SUPP Medication & CPT/J Code(s) submitted: Prolia  (Denosumab ) [id number] Diagnosis Code: M81.0 Route of submission (phone, fax, portal):  Phone # Fax # Auth type: Buy/Bill PB Units/visits requested: X2 DOSES Q6 MONTHS Reference number:  Approval from: 01/20/25 to 01/22/26

## 2025-01-21 ENCOUNTER — Other Ambulatory Visit: Payer: Self-pay | Admitting: Cardiology

## 2025-01-23 ENCOUNTER — Other Ambulatory Visit: Payer: Self-pay | Admitting: Family Medicine

## 2025-01-25 ENCOUNTER — Telehealth: Payer: Self-pay | Admitting: *Deleted

## 2025-01-25 MED ORDER — DIGOXIN 125 MCG PO TABS: 125.0000 ug | ORAL_TABLET | Freq: Every day | ORAL | 1 refills | Status: AC

## 2025-01-25 NOTE — Telephone Encounter (Signed)
" °*  STAT* If patient is at the pharmacy, call can be transferred to refill team.   1. Which medications need to be refilled? (please list name of each medication and dose if known)   digoxin  (LANOXIN ) 0.125 MG tablet     2. Would you like to learn more about the convenience, safety, & potential cost savings by using the [facility]? No   3. Are you open to using the [facility] (Type [facility]. ). No    4. Which pharmacy/location (including street and city if local pharmacy) is medication to be sent to? [id number] - [place], [place] - [address]     5. Do they need a 30 day or 90 day supply? 90  "

## 2025-01-28 ENCOUNTER — Telehealth: Payer: Self-pay

## 2025-01-28 ENCOUNTER — Encounter: Payer: Self-pay | Admitting: Internal Medicine

## 2025-01-28 ENCOUNTER — Encounter: Payer: Self-pay | Admitting: Family Medicine

## 2025-01-28 NOTE — Telephone Encounter (Signed)
 Received medical records from [facility]. Placed into Tammy's review folder in B Pod.

## 2025-03-14 ENCOUNTER — Ambulatory Visit: Admitting: Adult Health

## 2025-04-06 ENCOUNTER — Ambulatory Visit

## 2025-04-25 ENCOUNTER — Ambulatory Visit: Admitting: Family Medicine

## 2025-05-05 ENCOUNTER — Ambulatory Visit
